# Patient Record
Sex: Male | Born: 1965 | Race: Black or African American | Hispanic: No | Marital: Single | Smoking: Never smoker
Health system: Southern US, Community
[De-identification: ages and names within clinical notes are randomized; demographics above are authoritative.]

## PROBLEM LIST (undated history)

## (undated) DIAGNOSIS — G4733 Obstructive sleep apnea (adult) (pediatric): Secondary | ICD-10-CM

## (undated) DIAGNOSIS — Z8619 Personal history of other infectious and parasitic diseases: Secondary | ICD-10-CM

## (undated) DIAGNOSIS — J9610 Chronic respiratory failure, unspecified whether with hypoxia or hypercapnia: Secondary | ICD-10-CM

## (undated) DIAGNOSIS — R569 Unspecified convulsions: Secondary | ICD-10-CM

## (undated) DIAGNOSIS — J9600 Acute respiratory failure, unspecified whether with hypoxia or hypercapnia: Secondary | ICD-10-CM

## (undated) DIAGNOSIS — Z87440 Personal history of urinary (tract) infections: Secondary | ICD-10-CM

## (undated) DIAGNOSIS — K56609 Unspecified intestinal obstruction, unspecified as to partial versus complete obstruction: Secondary | ICD-10-CM

## (undated) DIAGNOSIS — M4628 Osteomyelitis of vertebra, sacral and sacrococcygeal region: Secondary | ICD-10-CM

## (undated) DIAGNOSIS — B957 Other staphylococcus as the cause of diseases classified elsewhere: Secondary | ICD-10-CM

## (undated) DIAGNOSIS — F329 Major depressive disorder, single episode, unspecified: Secondary | ICD-10-CM

## (undated) DIAGNOSIS — J189 Pneumonia, unspecified organism: Secondary | ICD-10-CM

## (undated) DIAGNOSIS — Z9989 Dependence on other enabling machines and devices: Secondary | ICD-10-CM

## (undated) DIAGNOSIS — Z8711 Personal history of peptic ulcer disease: Secondary | ICD-10-CM

## (undated) DIAGNOSIS — Z8719 Personal history of other diseases of the digestive system: Secondary | ICD-10-CM

## (undated) DIAGNOSIS — K219 Gastro-esophageal reflux disease without esophagitis: Secondary | ICD-10-CM

## (undated) DIAGNOSIS — I10 Essential (primary) hypertension: Secondary | ICD-10-CM

## (undated) DIAGNOSIS — F32A Depression, unspecified: Secondary | ICD-10-CM

## (undated) DIAGNOSIS — L8994 Pressure ulcer of unspecified site, stage 4: Secondary | ICD-10-CM

## (undated) DIAGNOSIS — A419 Sepsis, unspecified organism: Secondary | ICD-10-CM

## (undated) DIAGNOSIS — G8250 Quadriplegia, unspecified: Secondary | ICD-10-CM

## (undated) DIAGNOSIS — D649 Anemia, unspecified: Secondary | ICD-10-CM

## (undated) DIAGNOSIS — L98499 Non-pressure chronic ulcer of skin of other sites with unspecified severity: Secondary | ICD-10-CM

## (undated) HISTORY — DX: Acute respiratory failure, unspecified whether with hypoxia or hypercapnia: J96.00

## (undated) HISTORY — DX: Other staphylococcus as the cause of diseases classified elsewhere: B95.7

## (undated) HISTORY — DX: Quadriplegia, unspecified: G82.50

## (undated) HISTORY — DX: Morbid (severe) obesity due to excess calories: E66.01

## (undated) HISTORY — DX: Pressure ulcer of unspecified site, stage 4: L89.94

## (undated) HISTORY — DX: Personal history of other infectious and parasitic diseases: Z86.19

## (undated) HISTORY — DX: Osteomyelitis of vertebra, sacral and sacrococcygeal region: M46.28

## (undated) HISTORY — DX: Personal history of other diseases of the digestive system: Z87.19

## (undated) HISTORY — DX: Anemia, unspecified: D64.9

## (undated) HISTORY — PX: SUPRAPUBIC CATHETER PLACEMENT: SHX2473

## (undated) HISTORY — DX: Personal history of urinary (tract) infections: Z87.440

## (undated) HISTORY — DX: Essential (primary) hypertension: I10

## (undated) HISTORY — DX: Personal history of peptic ulcer disease: Z87.11

## (undated) HISTORY — DX: Chronic respiratory failure, unspecified whether with hypoxia or hypercapnia: J96.10

---
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
## 1986-05-16 HISTORY — PX: POSTERIOR CERVICAL FUSION/FORAMINOTOMY: SHX5038

## 1998-11-26 ENCOUNTER — Emergency Department (HOSPITAL_COMMUNITY): Admission: EM | Admit: 1998-11-26 | Discharge: 1998-11-26 | Payer: Self-pay | Admitting: Emergency Medicine

## 1998-11-26 ENCOUNTER — Encounter: Payer: Self-pay | Admitting: Internal Medicine

## 1998-12-07 ENCOUNTER — Encounter: Payer: Self-pay | Admitting: Emergency Medicine

## 1998-12-08 ENCOUNTER — Inpatient Hospital Stay (HOSPITAL_COMMUNITY): Admission: EM | Admit: 1998-12-08 | Discharge: 1998-12-11 | Payer: Self-pay | Admitting: Emergency Medicine

## 1998-12-09 ENCOUNTER — Encounter: Payer: Self-pay | Admitting: Internal Medicine

## 2003-06-25 ENCOUNTER — Inpatient Hospital Stay (HOSPITAL_COMMUNITY): Admission: EM | Admit: 2003-06-25 | Discharge: 2003-06-30 | Payer: Self-pay | Admitting: Emergency Medicine

## 2003-11-16 ENCOUNTER — Emergency Department (HOSPITAL_COMMUNITY): Admission: EM | Admit: 2003-11-16 | Discharge: 2003-11-16 | Payer: Self-pay | Admitting: Emergency Medicine

## 2004-03-17 ENCOUNTER — Emergency Department (HOSPITAL_COMMUNITY): Admission: EM | Admit: 2004-03-17 | Discharge: 2004-03-17 | Payer: Self-pay | Admitting: Emergency Medicine

## 2004-08-25 ENCOUNTER — Emergency Department (HOSPITAL_COMMUNITY): Admission: EM | Admit: 2004-08-25 | Discharge: 2004-08-25 | Payer: Self-pay | Admitting: Emergency Medicine

## 2004-08-27 ENCOUNTER — Inpatient Hospital Stay (HOSPITAL_COMMUNITY): Admission: EM | Admit: 2004-08-27 | Discharge: 2004-08-31 | Payer: Self-pay | Admitting: Emergency Medicine

## 2005-03-02 ENCOUNTER — Emergency Department (HOSPITAL_COMMUNITY): Admission: EM | Admit: 2005-03-02 | Discharge: 2005-03-02 | Payer: Self-pay | Admitting: Emergency Medicine

## 2005-05-10 ENCOUNTER — Inpatient Hospital Stay (HOSPITAL_COMMUNITY): Admission: EM | Admit: 2005-05-10 | Discharge: 2005-06-17 | Payer: Self-pay | Admitting: Emergency Medicine

## 2005-05-16 HISTORY — PX: COLOSTOMY: SHX63

## 2005-05-26 ENCOUNTER — Ambulatory Visit: Payer: Self-pay | Admitting: Internal Medicine

## 2005-05-29 ENCOUNTER — Encounter (INDEPENDENT_AMBULATORY_CARE_PROVIDER_SITE_OTHER): Payer: Self-pay | Admitting: Specialist

## 2005-05-31 ENCOUNTER — Encounter (INDEPENDENT_AMBULATORY_CARE_PROVIDER_SITE_OTHER): Payer: Self-pay | Admitting: Cardiology

## 2005-06-02 ENCOUNTER — Ambulatory Visit: Payer: Self-pay | Admitting: Plastic Surgery

## 2005-09-08 ENCOUNTER — Emergency Department (HOSPITAL_COMMUNITY): Admission: EM | Admit: 2005-09-08 | Discharge: 2005-09-09 | Payer: Self-pay | Admitting: Emergency Medicine

## 2006-01-23 ENCOUNTER — Emergency Department (HOSPITAL_COMMUNITY): Admission: EM | Admit: 2006-01-23 | Discharge: 2006-01-23 | Payer: Self-pay | Admitting: Emergency Medicine

## 2006-02-06 IMAGING — CR DG ABDOMEN ACUTE W/ 1V CHEST
4 series · 4 of 4 positions shown · non-contrast
Comparison: none

CLINICAL DATA: Nausea/vomiting.
 ACUTE ABDOMEN WITH CHEST 
 AP chest x-ray compared to 12/07/98. 
 Heart enlarged.  Cannot rule out early right upper lobe air space disease although this could easily be a chronic abnormality.  Flat and decubitus views of the abdomen show generalized dilatation of large and small bowel loops without free air.  Short segment air fluid levels are noted but no definitive support specific abnormality.  There appears to be myositis ossificans of the hips. 
 IMPRESSION 
 Cardiomegaly. 
 Question early right upper lobe air space disease.  
 Diffuse dilatation of large and small bowel loops without evidence for obstruction.

[view not recorded (1 of 4)]
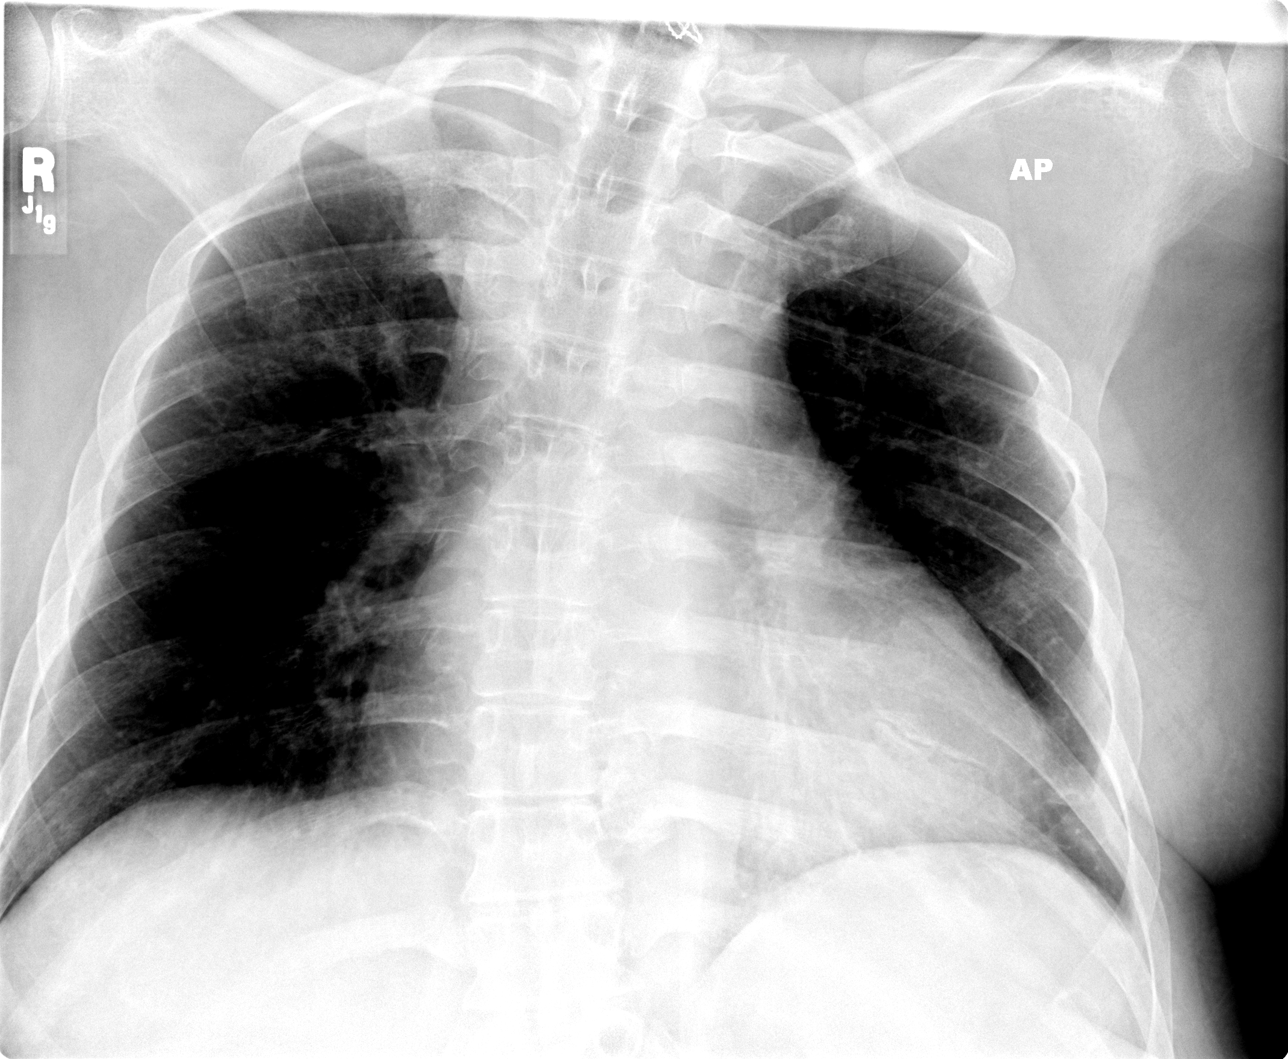

[view not recorded (2 of 4)]
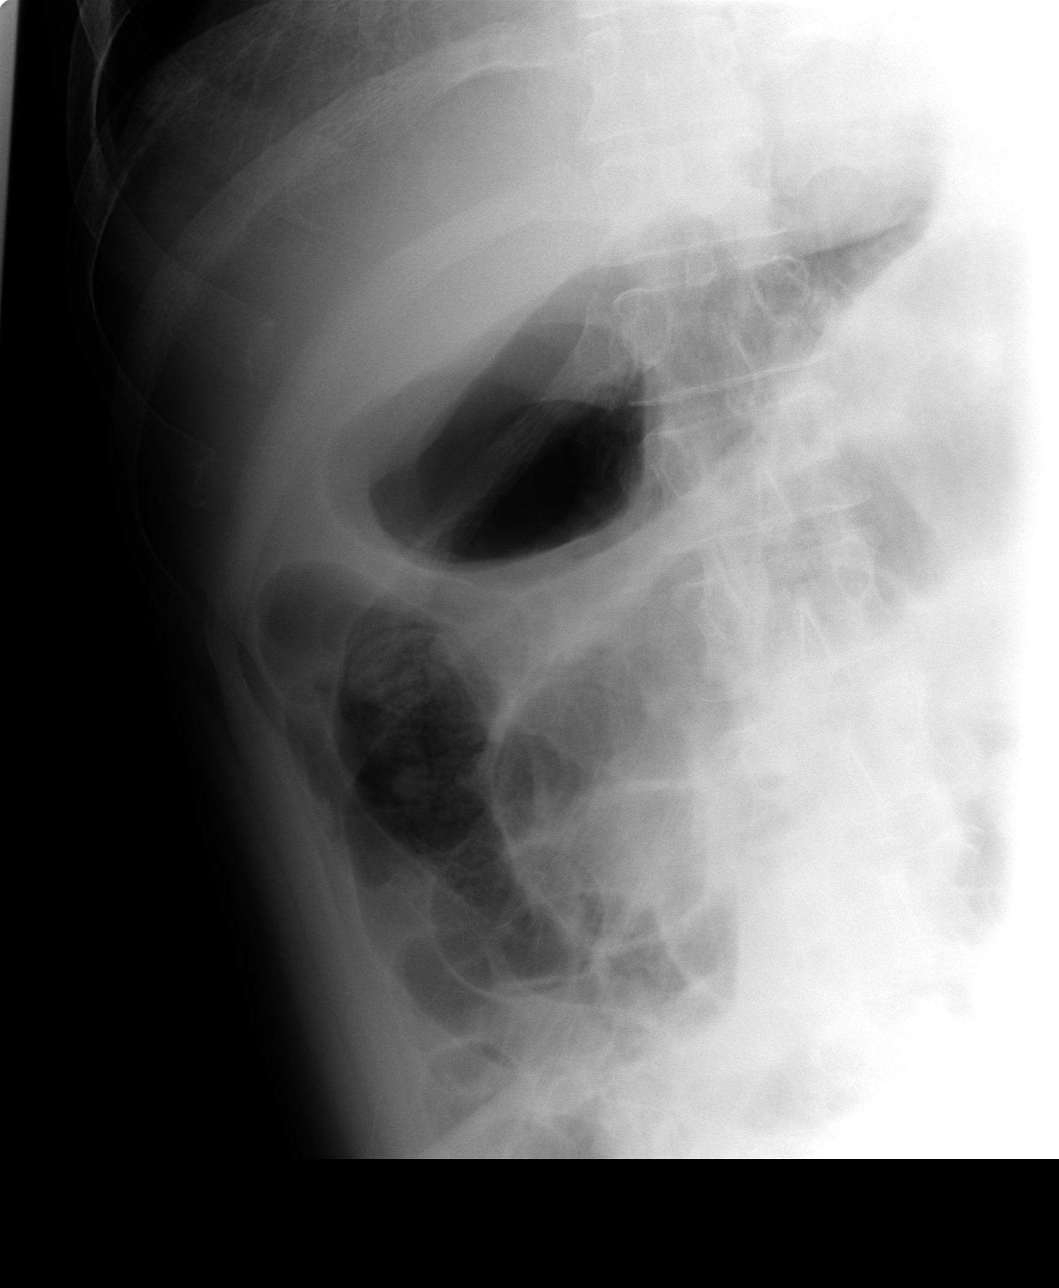

[view not recorded (3 of 4)]
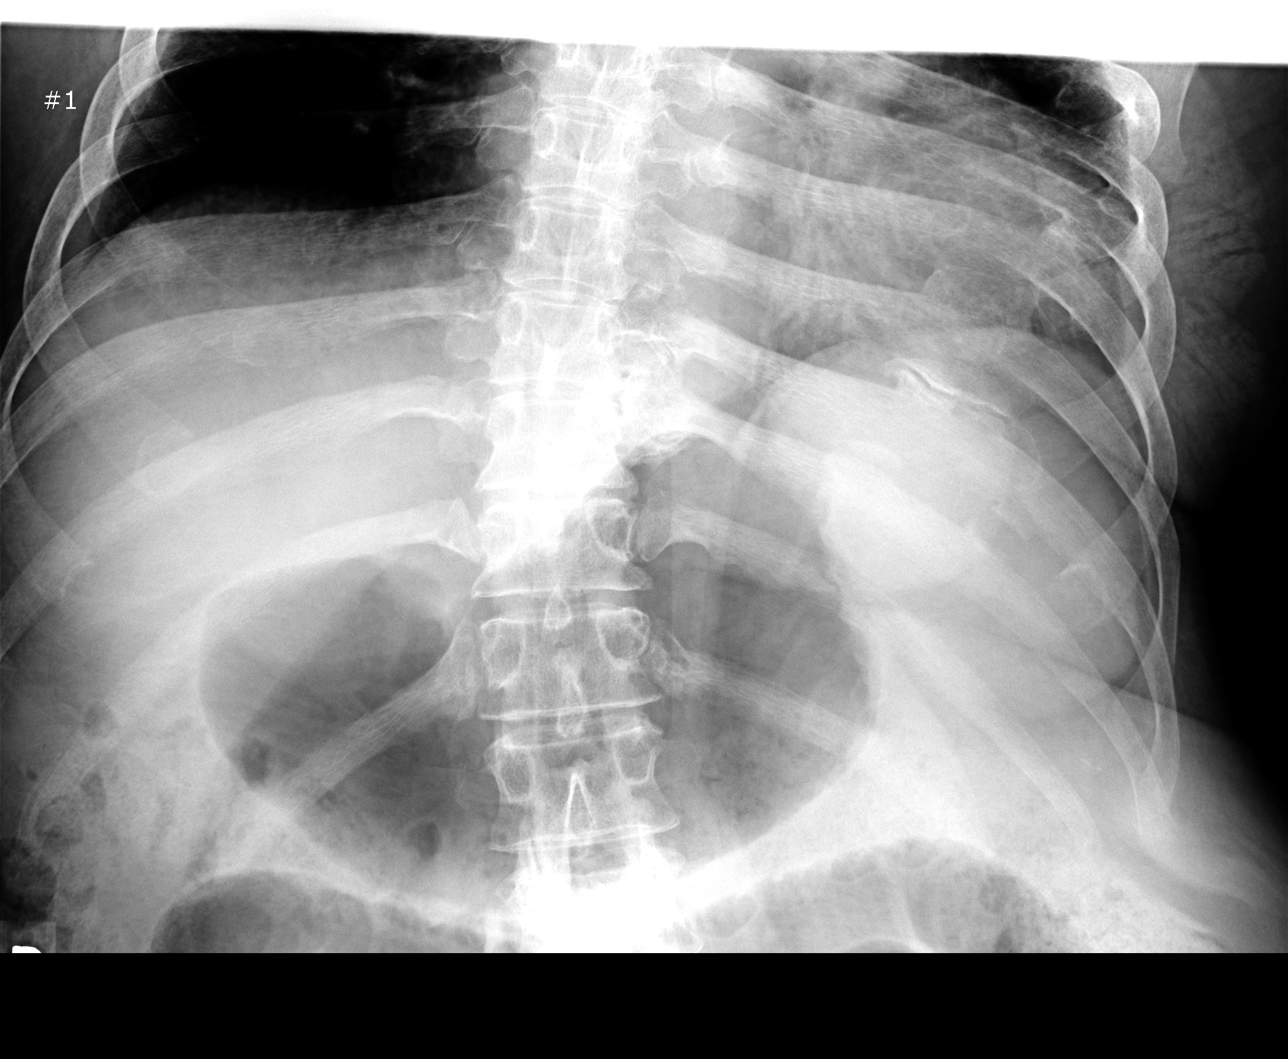

[view not recorded (4 of 4)]
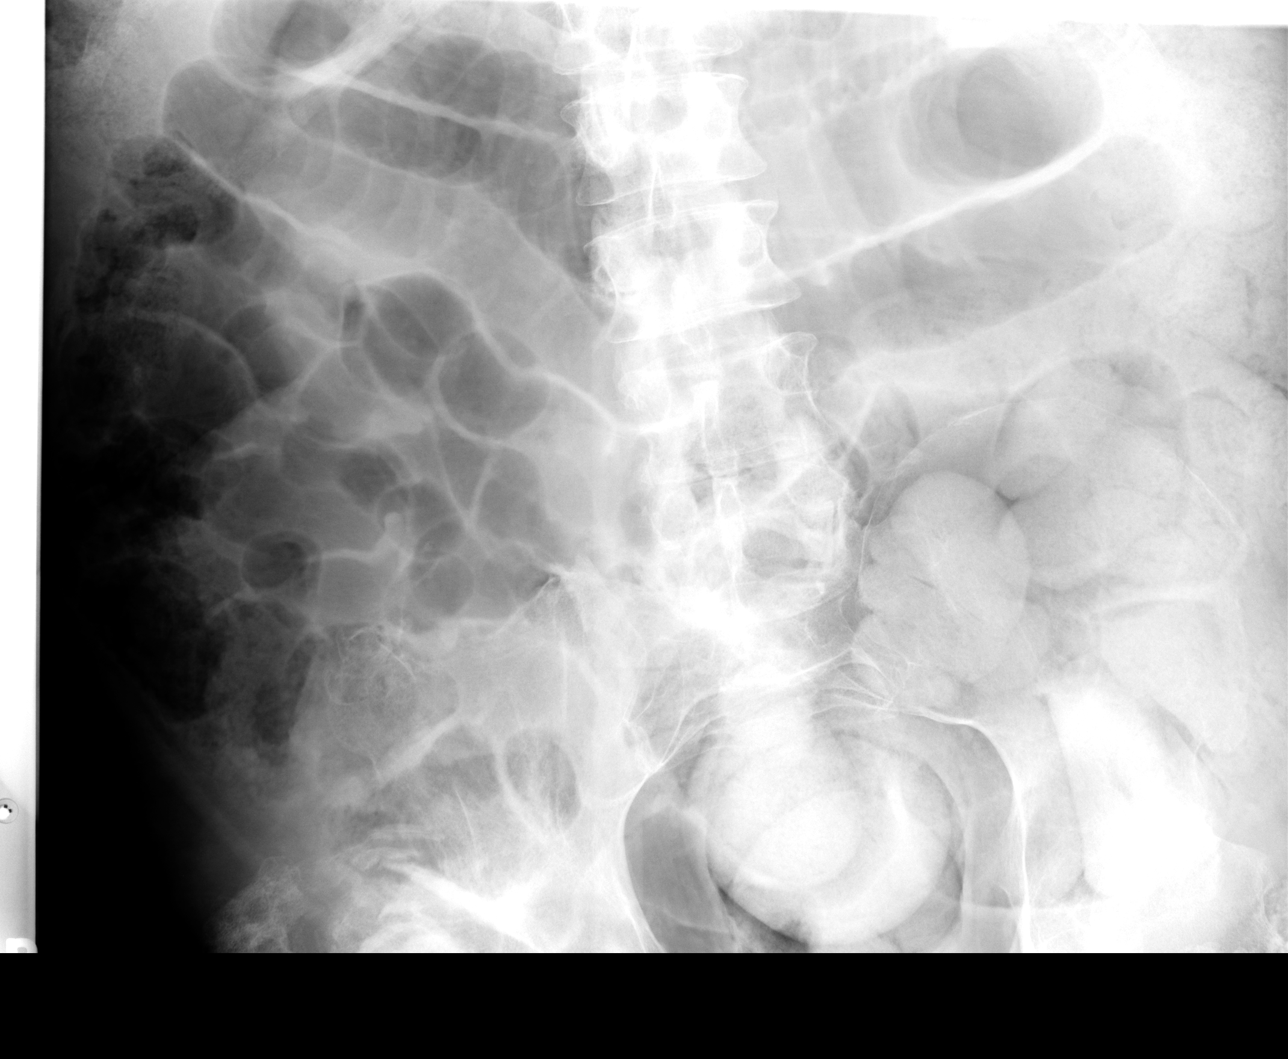

[4 of 4 positions shown; findings below may reference images not displayed]

## 2006-02-07 IMAGING — CR DG ABD PORTABLE 1V
1 series · 1 of 1 positions shown · non-contrast
Comparison: none

CLINICAL DATA: Nausea/vomiting.
 PORTABLE ABDOMEN, 06/26/03, [DATE] HOURS
 Comparison 06/25/03.
 There is gaseous distention of the colon.  Multiple gas-distended small bowel loops borderline dilated.  Phlebolith in lower left pelvis.
 IMPRESSION 
 Increase in gaseous distention of small bowel and colon.  Question worsening ileus.

[view not recorded]
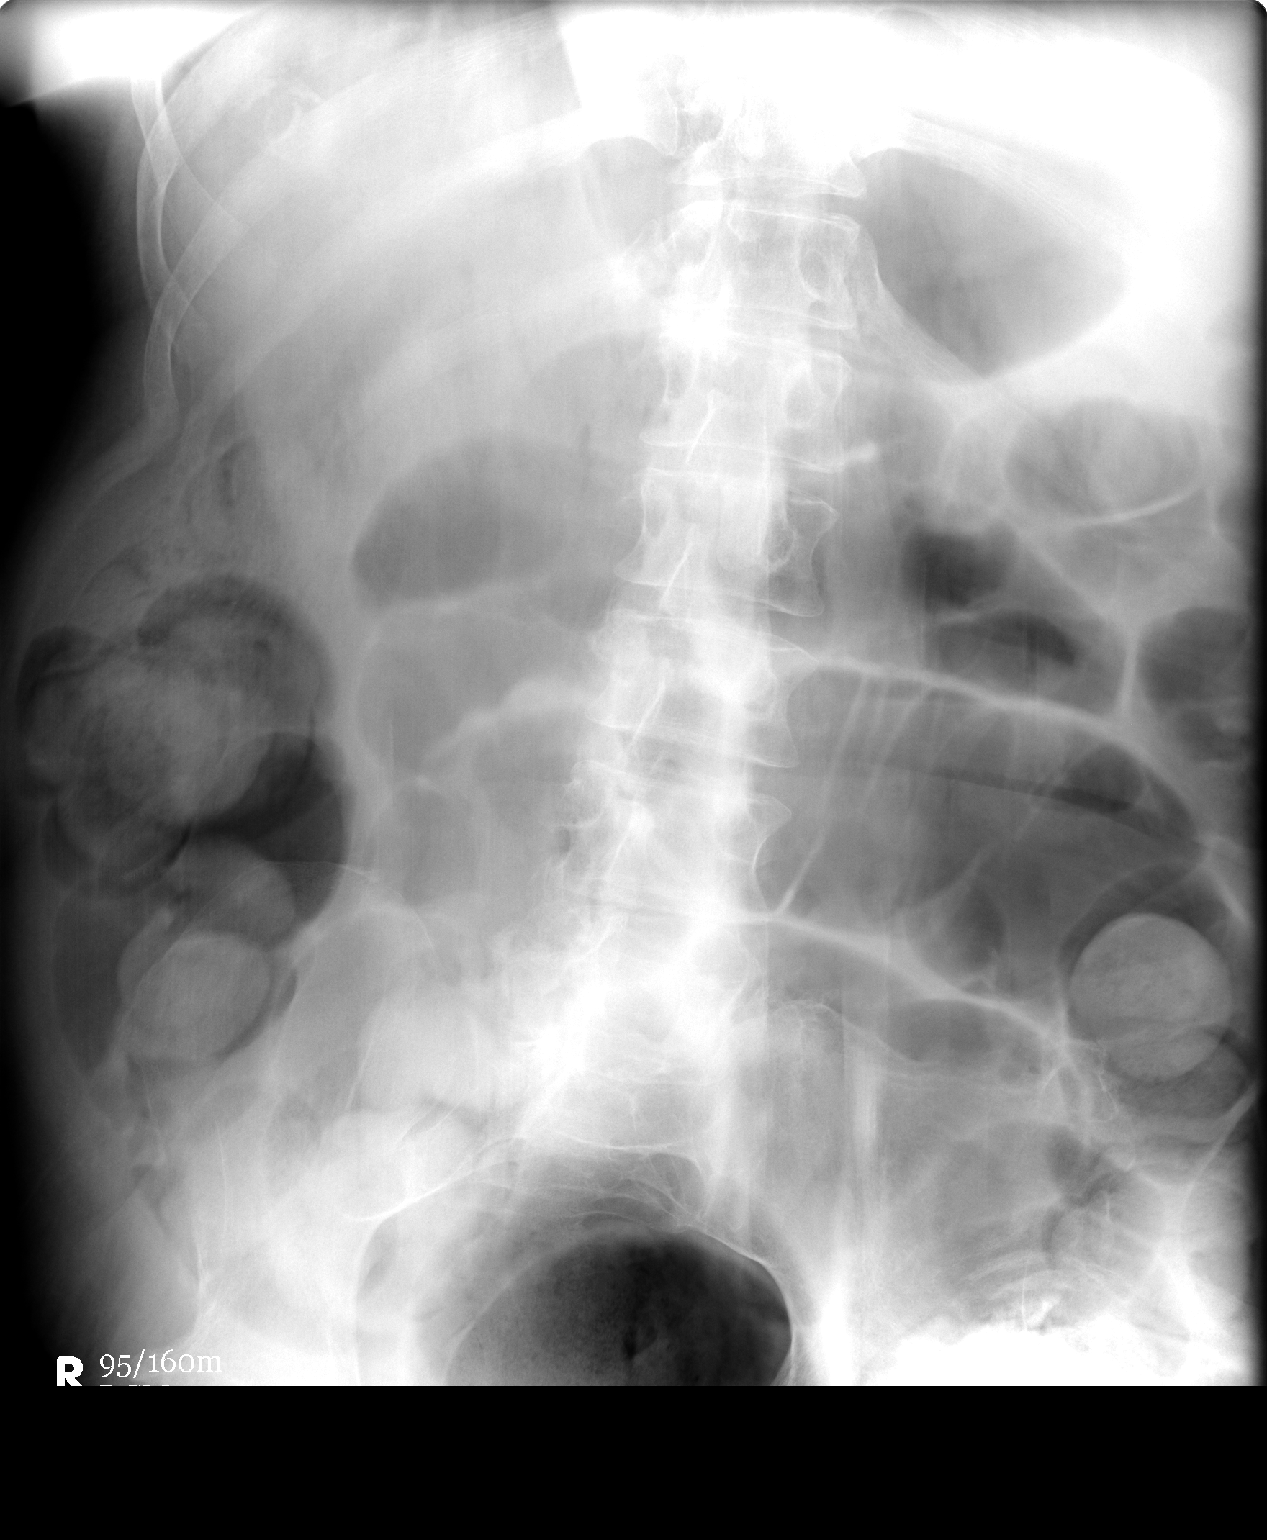

[1 of 1 positions shown; findings below may reference images not displayed]

## 2006-02-07 IMAGING — XA IR CV CATH FLUORO GUIDE
1 series · 1 of 1 positions shown · non-contrast
Comparison: none

CLINICAL DATA: Pneumonia, needs PICC line placement. 
 FLUOROSCOPIC AND ULTRASOUND GUIDED PICC LINE PLACEMENT
TECHNIQUE: The right arm was prepped with Betadine, draped in the usual sterile fashion, and infiltrated locally with 1% lidocaine.  Ultrasound demonstrated patency of the right basilic vein.  Under real-time ultrasound guidance, this vein was accessed with a 09gauge micropuncture needle.  Ultrasound image documentation was performed.  The needle was exchanged over a guidewire for a peel-away sheath, through which a 5 french double lumen PICC catheter trimmed to 44 cm was advanced, positioned with its tip at the distal SVC/right atrial junction.  Fluoroscopy during the procedure and fluoro spot radiograph confirms appropriate catheter position.  The catheter was flushed, secured to the skin with Prolene sutures, and covered with a sterile dressing.  No immediate complication.  
 IMPRESSION
 Technically successful right arm PICC placement with ultrasound and fluoroscopic guidance.  Ready for routine use.

[Series 1: run · 1 of 1 slices shown]
[im 1/1]
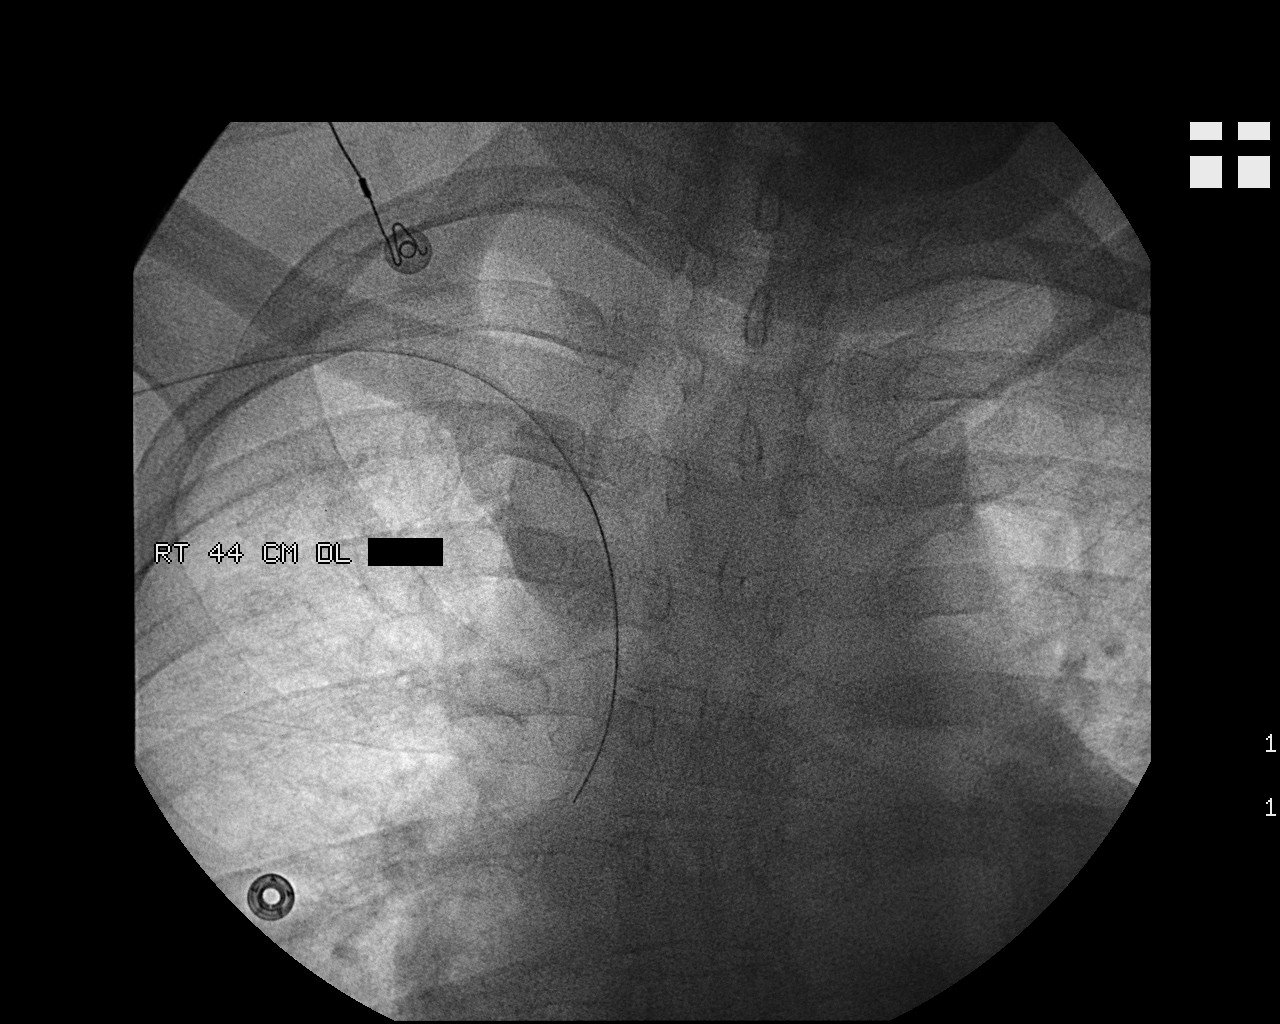

[1 of 1 positions shown; findings below may reference images not displayed]

## 2006-02-08 IMAGING — CR DG CHEST 1V PORT
1 series · 1 of 1 positions shown · non-contrast
Comparison: none

CLINICAL DATA: Pneumonia.  
 PORTABLE CHEST 06/27/03 AT 8027 HOURS

[view not recorded]
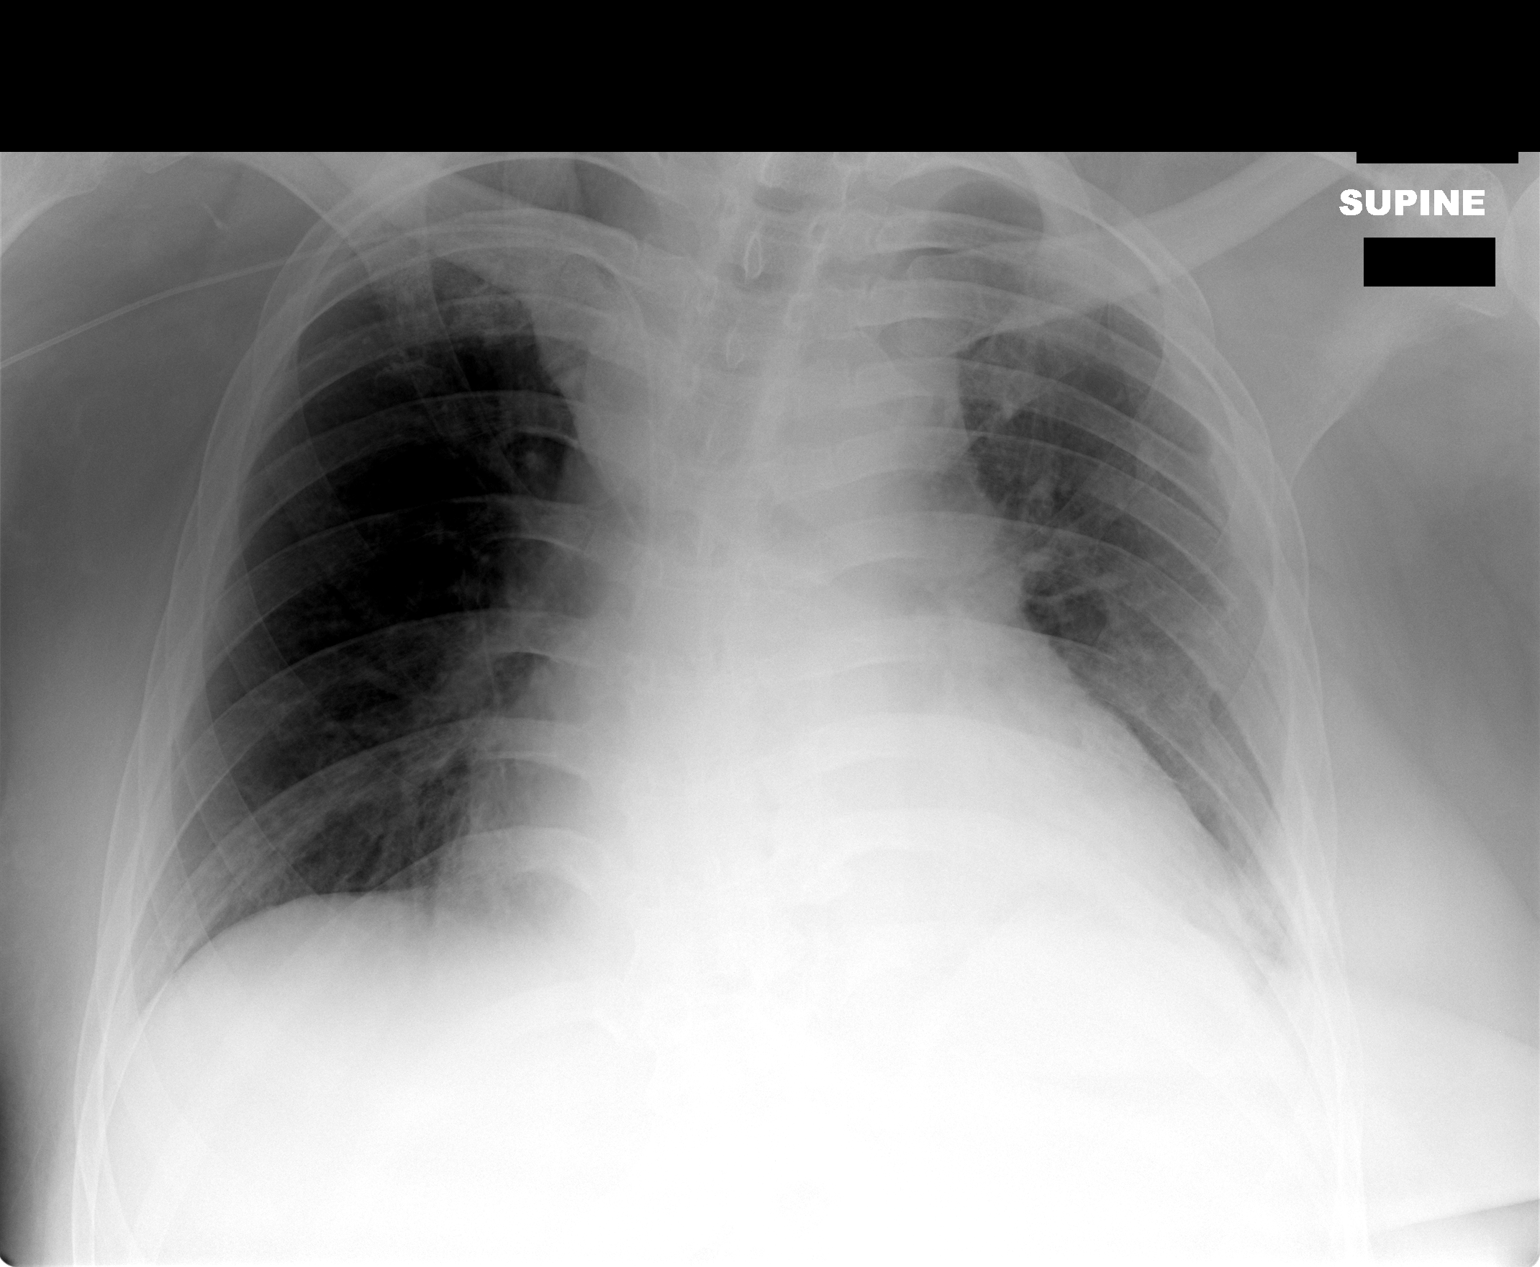

[1 of 1 positions shown; findings below may reference images not displayed]

FINDINGS: PICC line extends to the low SVC.  Coarse opacities in the right upper lobe are stable compared to films dating back to 12/03/96.  There is moderate enlargement of the cardiac silhouette.  Old left rib fractures are evident.  
 IMPRESSION
 Stable cardiomegaly and chronic changes as above.

## 2006-02-09 IMAGING — CR DG ABD PORTABLE 1V
1 series · 1 of 1 positions shown · non-contrast
Comparison: none

CLINICAL DATA: Ileus.  
 PORTABLE ABDOMEN 06/28/03
 Comparison to 06/26/03.  The lower abdomen is excluded.  There is a moderate amount of stool in the proximal colon with gaseous distention of colon and distention of some visualized small bowel loops.  The degree of distention appears stable to that seen on previous study.  
 IMPRESSION
 Little apparent change in ileus since 06/26/03.  Note that the lower abdomen is excluded.

[view not recorded]
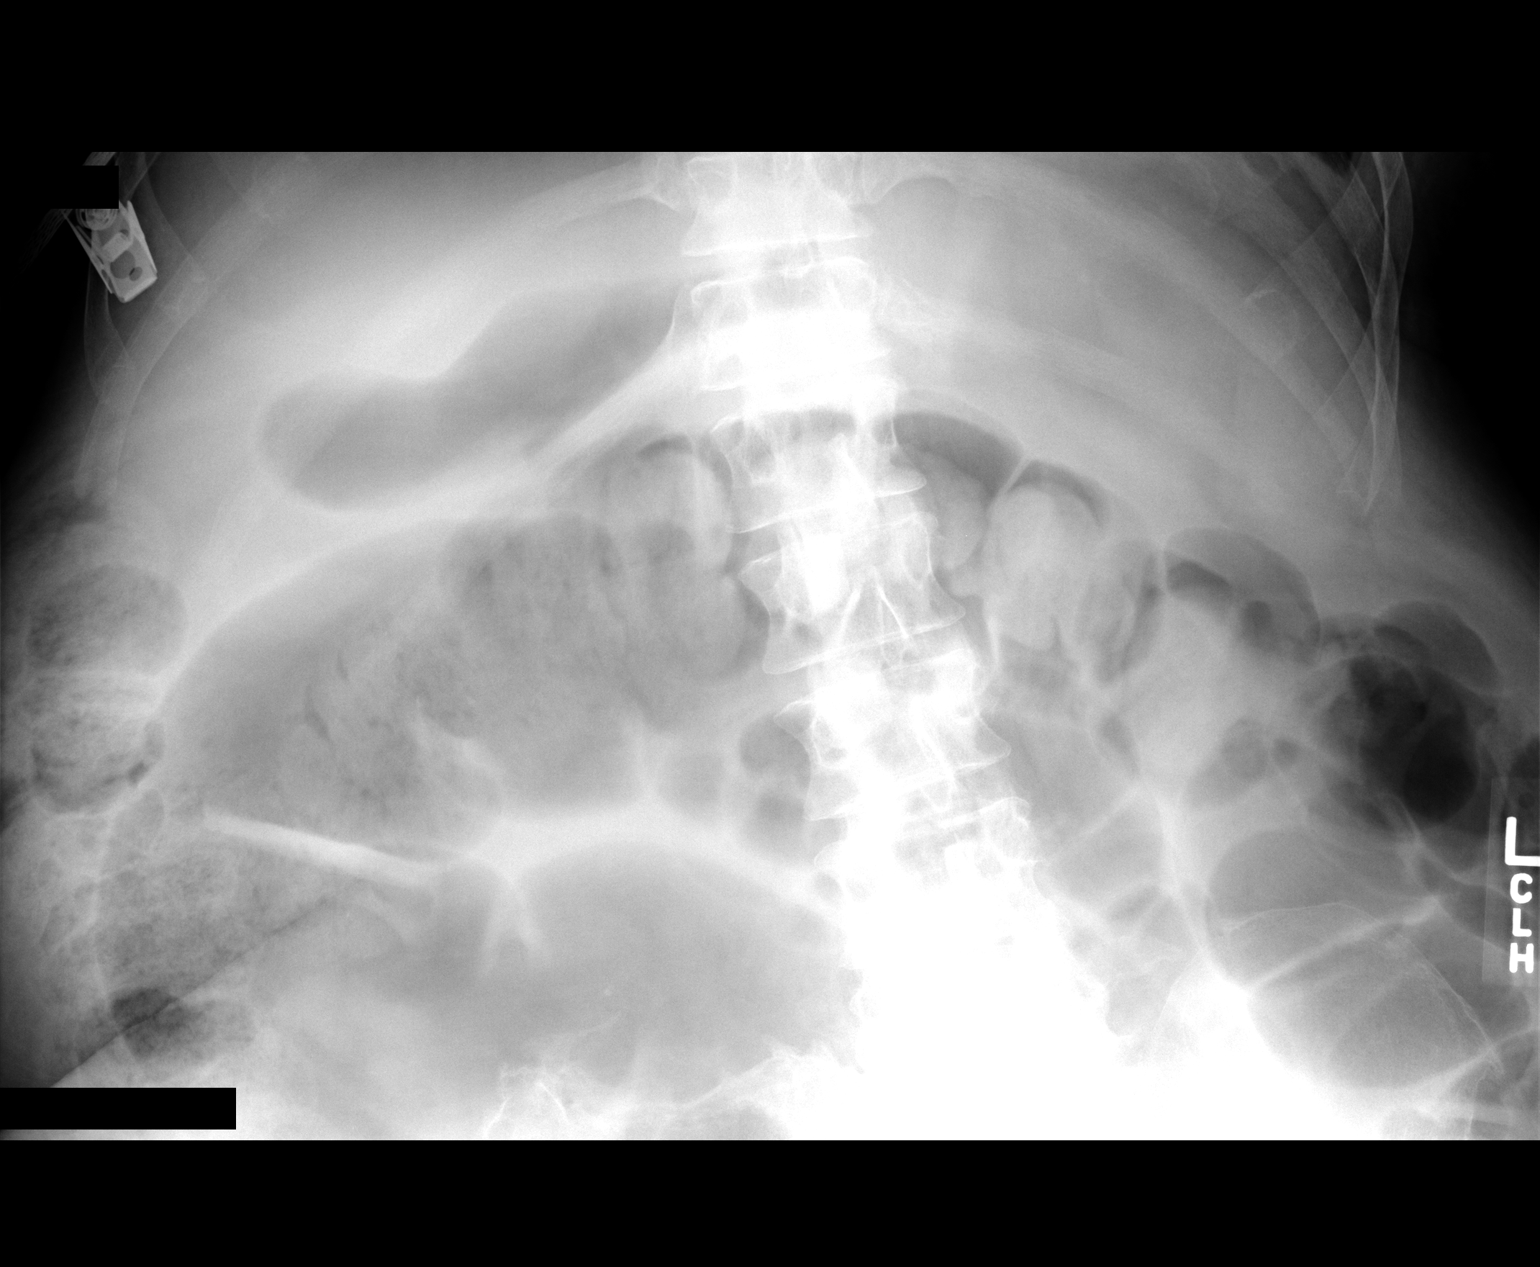

[1 of 1 positions shown; findings below may reference images not displayed]

## 2006-03-25 ENCOUNTER — Emergency Department (HOSPITAL_COMMUNITY): Admission: EM | Admit: 2006-03-25 | Discharge: 2006-03-25 | Payer: Self-pay | Admitting: Emergency Medicine

## 2006-06-19 ENCOUNTER — Inpatient Hospital Stay (HOSPITAL_COMMUNITY): Admission: EM | Admit: 2006-06-19 | Discharge: 2006-06-22 | Payer: Self-pay | Admitting: Emergency Medicine

## 2006-06-19 ENCOUNTER — Ambulatory Visit: Payer: Self-pay | Admitting: Vascular Surgery

## 2006-06-22 ENCOUNTER — Ambulatory Visit: Payer: Self-pay | Admitting: Gastroenterology

## 2006-06-30 IMAGING — CR DG CHEST 2V
3 series · 3 of 3 positions shown · non-contrast
Comparison: none

CLINICAL DATA: Chills and vomiting.  

 TWO VIEW CHEST, 11/16/03 
 Comparison 06/27/03.

[view not recorded (1 of 3)]
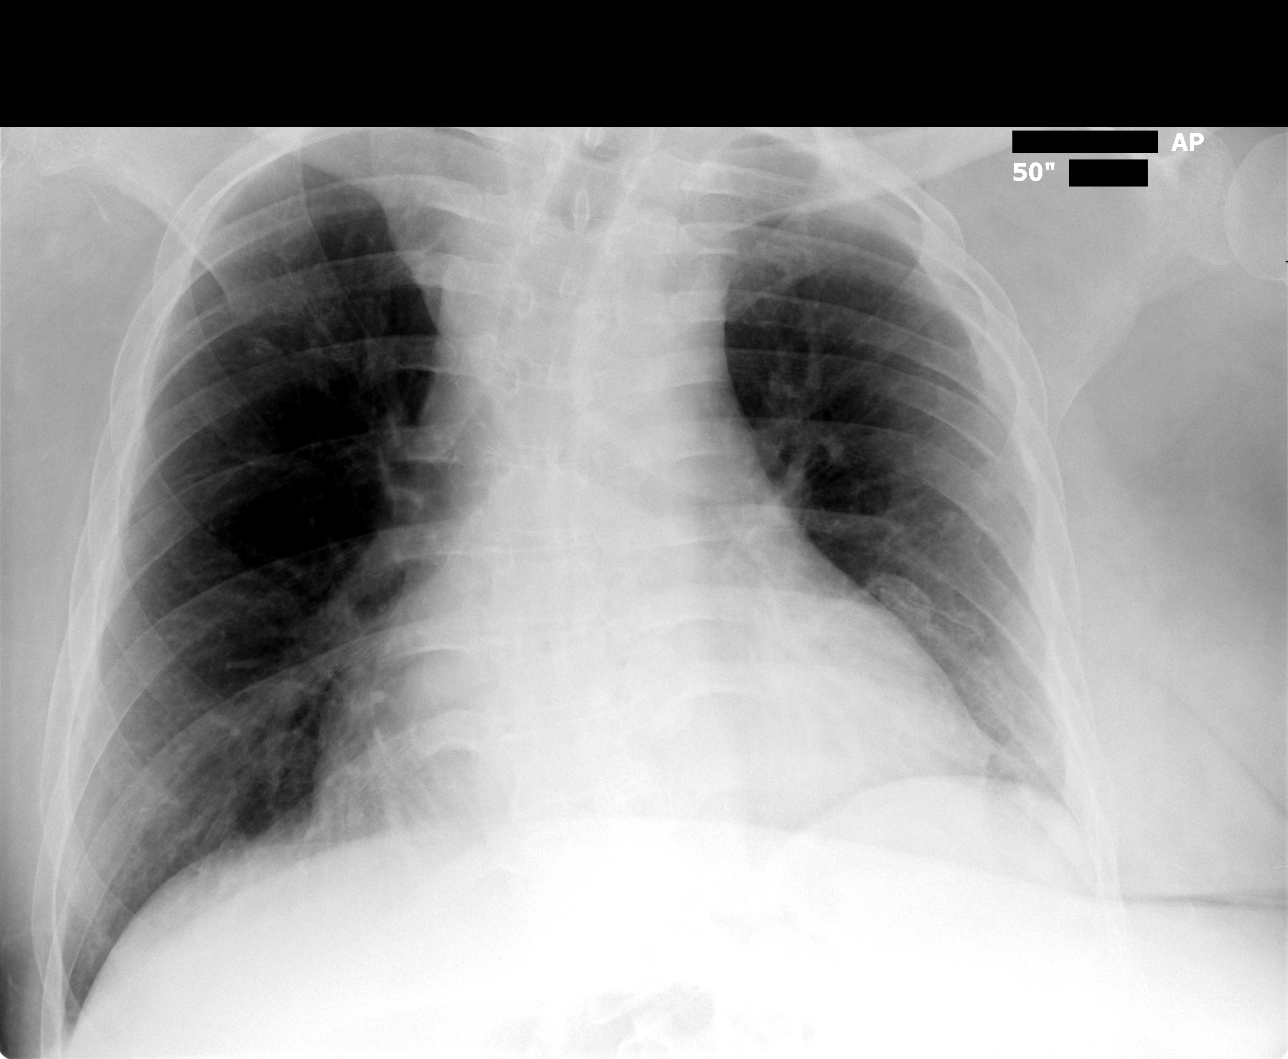

[view not recorded (2 of 3)]
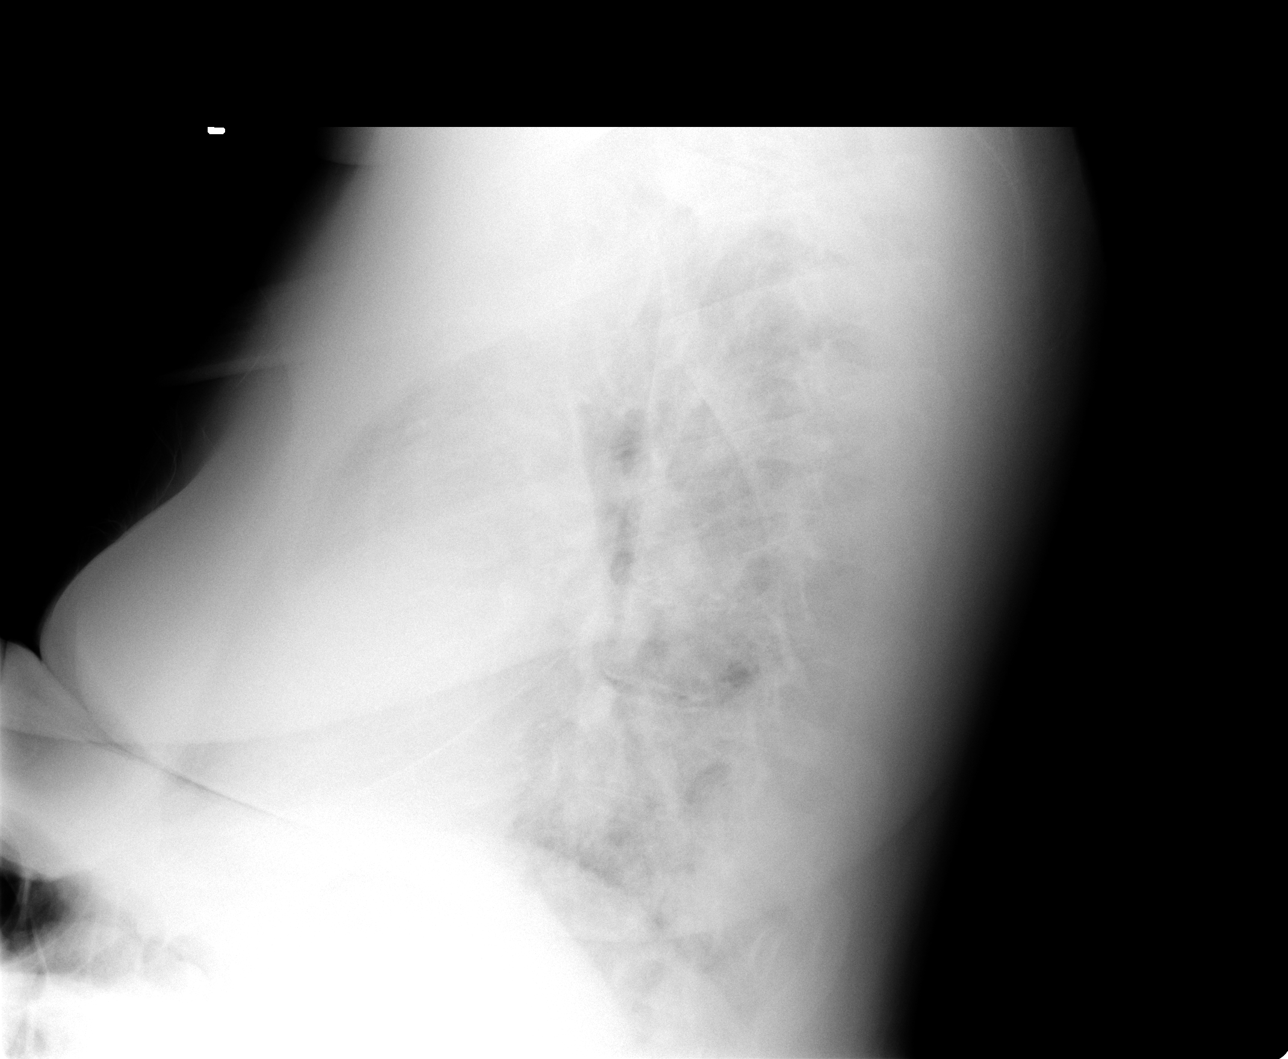

[view not recorded (3 of 3)]
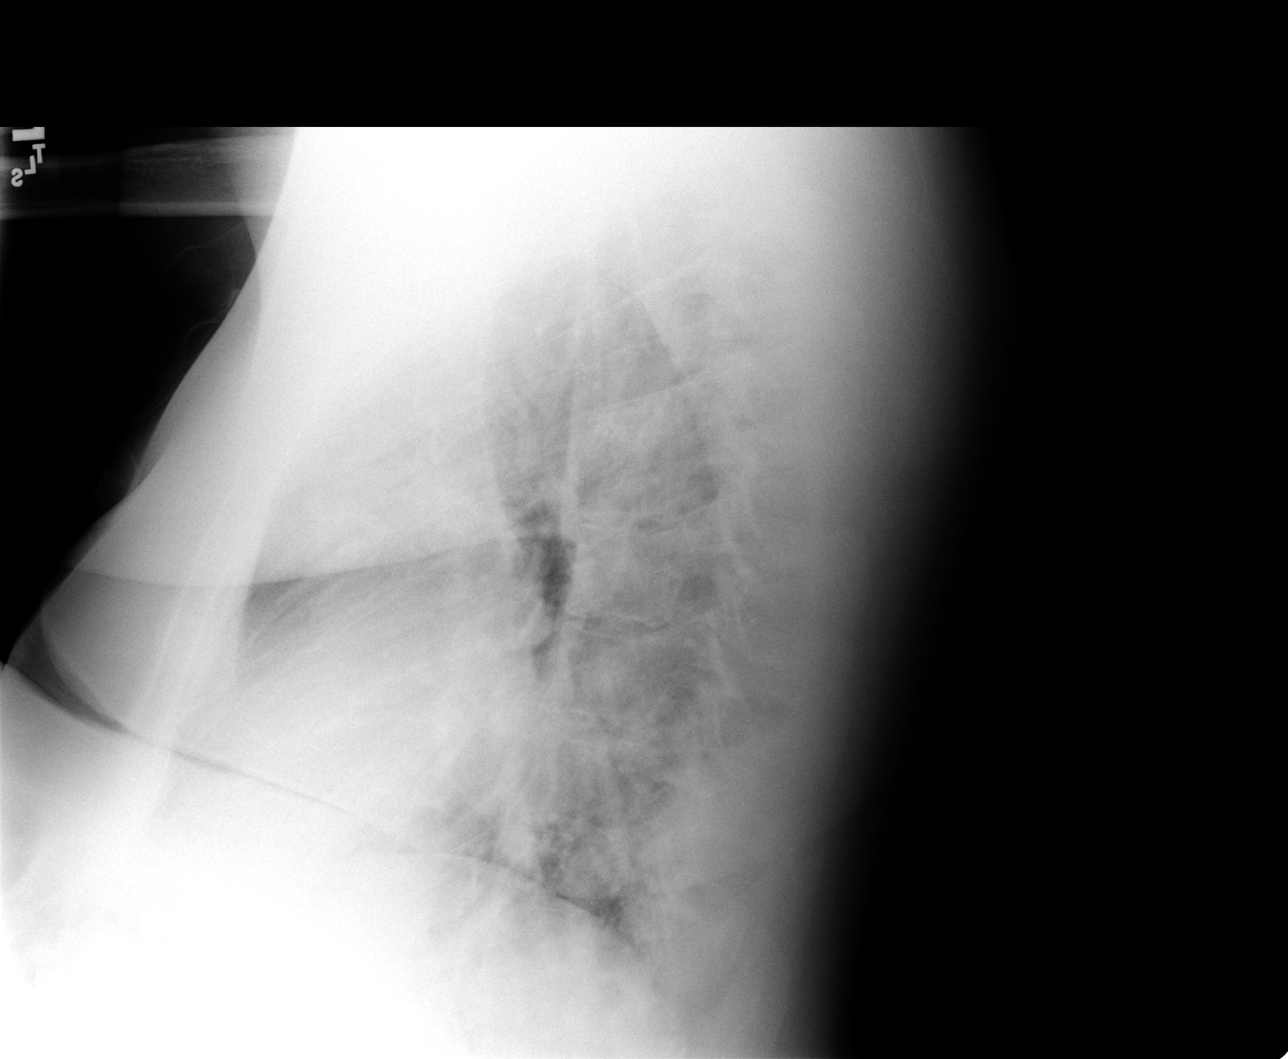

[3 of 3 positions shown; findings below may reference images not displayed]

FINDINGS: Two view exam of the chest shows no substantial change when compared to 06/27/03.  The coarse opacities over the right upper lobe on the previous study are unchanged.  Cardiopericardial silhouette remains enlarged.  Left pleural thickening and apparent healed left rib fractures are unchanged in appearance. 

 IMPRESSION
 Cardiomegaly.  

 Stable exam.  No acute cardiopulmonary process.

 [REDACTED]

## 2006-07-17 ENCOUNTER — Inpatient Hospital Stay (HOSPITAL_COMMUNITY): Admission: EM | Admit: 2006-07-17 | Discharge: 2006-07-21 | Payer: Self-pay | Admitting: Emergency Medicine

## 2006-07-25 ENCOUNTER — Ambulatory Visit: Payer: Self-pay | Admitting: Gastroenterology

## 2006-07-25 ENCOUNTER — Inpatient Hospital Stay (HOSPITAL_COMMUNITY): Admission: EM | Admit: 2006-07-25 | Discharge: 2006-07-29 | Payer: Self-pay | Admitting: Emergency Medicine

## 2006-07-27 ENCOUNTER — Ambulatory Visit: Payer: Self-pay | Admitting: *Deleted

## 2006-08-25 ENCOUNTER — Emergency Department (HOSPITAL_COMMUNITY): Admission: EM | Admit: 2006-08-25 | Discharge: 2006-08-25 | Payer: Self-pay | Admitting: Emergency Medicine

## 2006-09-26 ENCOUNTER — Inpatient Hospital Stay (HOSPITAL_COMMUNITY): Admission: EM | Admit: 2006-09-26 | Discharge: 2006-10-02 | Payer: Self-pay | Admitting: Emergency Medicine

## 2006-10-11 ENCOUNTER — Ambulatory Visit (HOSPITAL_COMMUNITY): Admission: RE | Admit: 2006-10-11 | Discharge: 2006-10-11 | Payer: Self-pay | Admitting: Internal Medicine

## 2006-10-30 IMAGING — CR DG CHEST 1V PORT
1 series · 1 of 1 positions shown · non-contrast
Comparison: Portable chest x-ray 11/16/2003.

CLINICAL DATA: Fever, generalized weakness.

PORTABLE CHEST - 1 VIEW  [DATE]/[id number] hours:

[view not recorded]
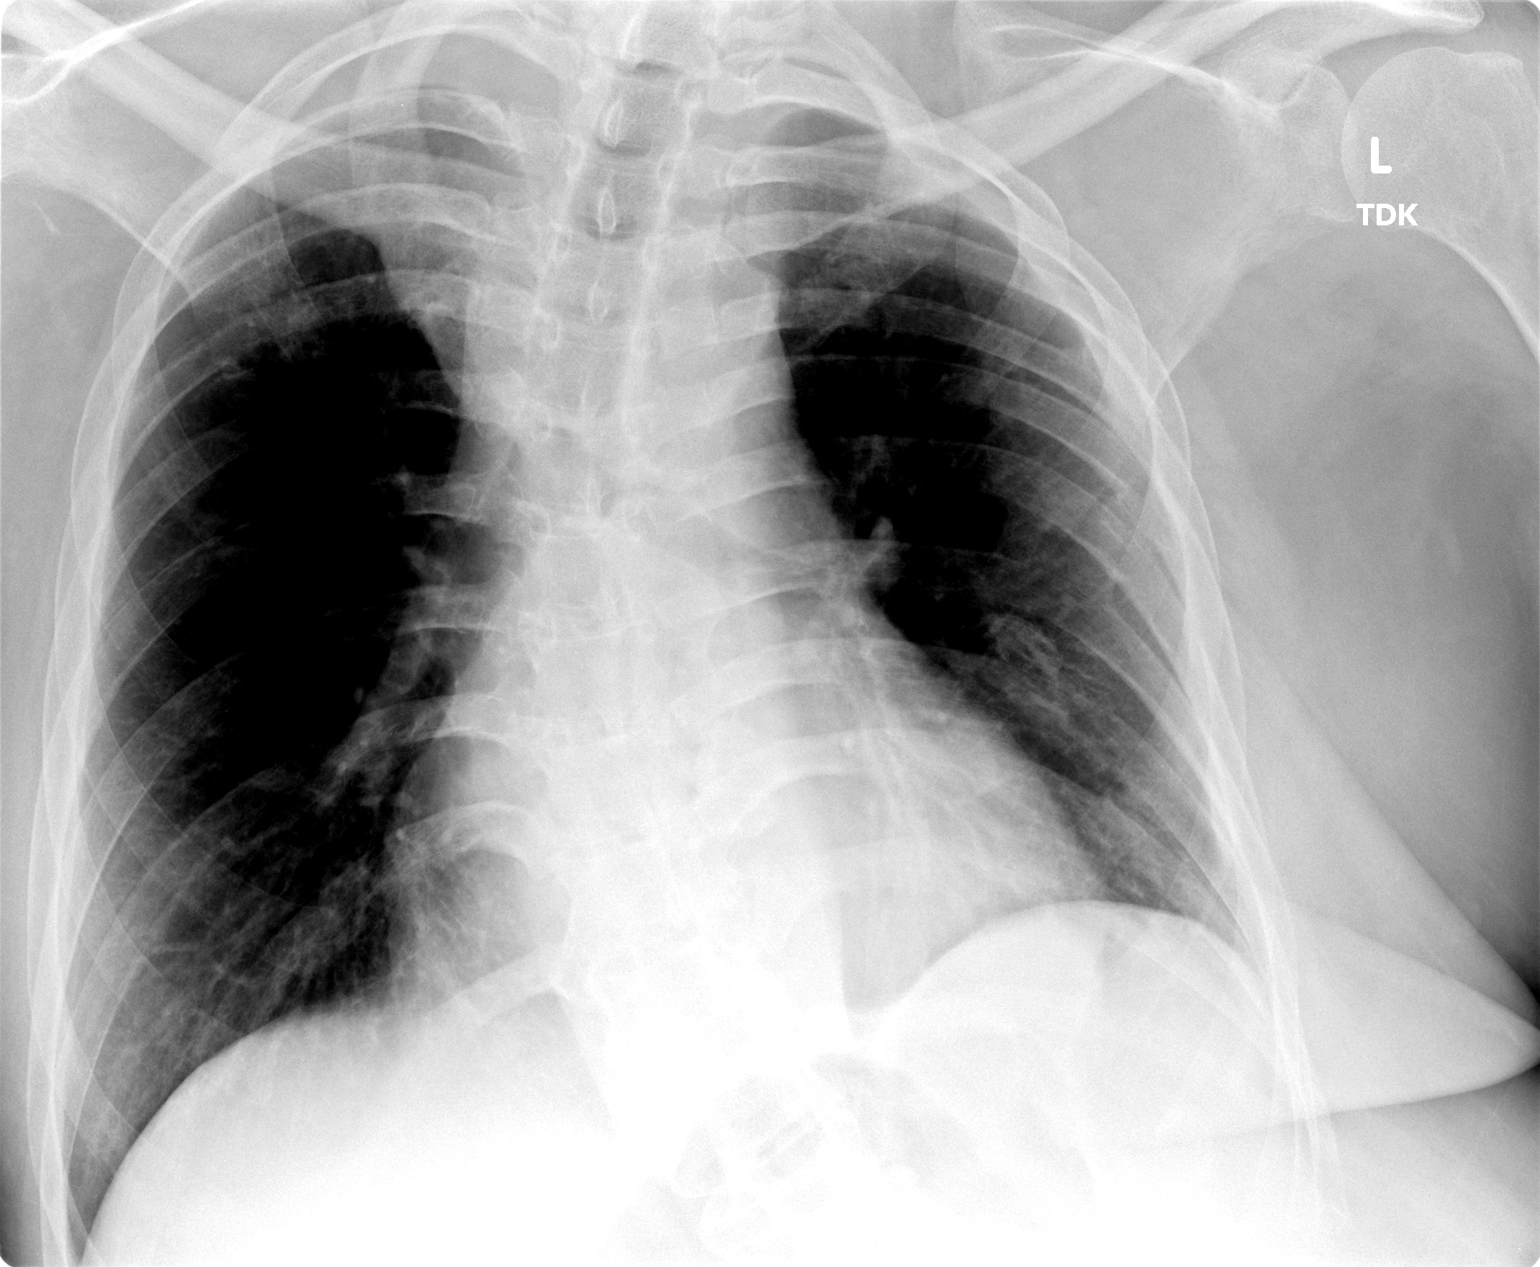

[1 of 1 positions shown; findings below may reference images not displayed]

FINDINGS: The heart is mildly enlarged though stable. The lungs appear clear. Note is again made
of a prominent epicardial fat pad on the right. Thoracic scoliosis convex right again noted.
IMPRESSION: Mild cardiomegaly. No evidence of acute disease.

## 2007-02-05 ENCOUNTER — Emergency Department (HOSPITAL_COMMUNITY): Admission: EM | Admit: 2007-02-05 | Discharge: 2007-02-05 | Payer: Self-pay | Admitting: Emergency Medicine

## 2007-04-09 IMAGING — CR DG CHEST 1V PORT
1 series · 1 of 1 positions shown · non-contrast
Comparison: 03/17/04.

CLINICAL DATA: Diaphoresis. Sweating and chills.   Quadriplegia. 
 PORTABLE CHEST ? 08/25/04:

[view not recorded]
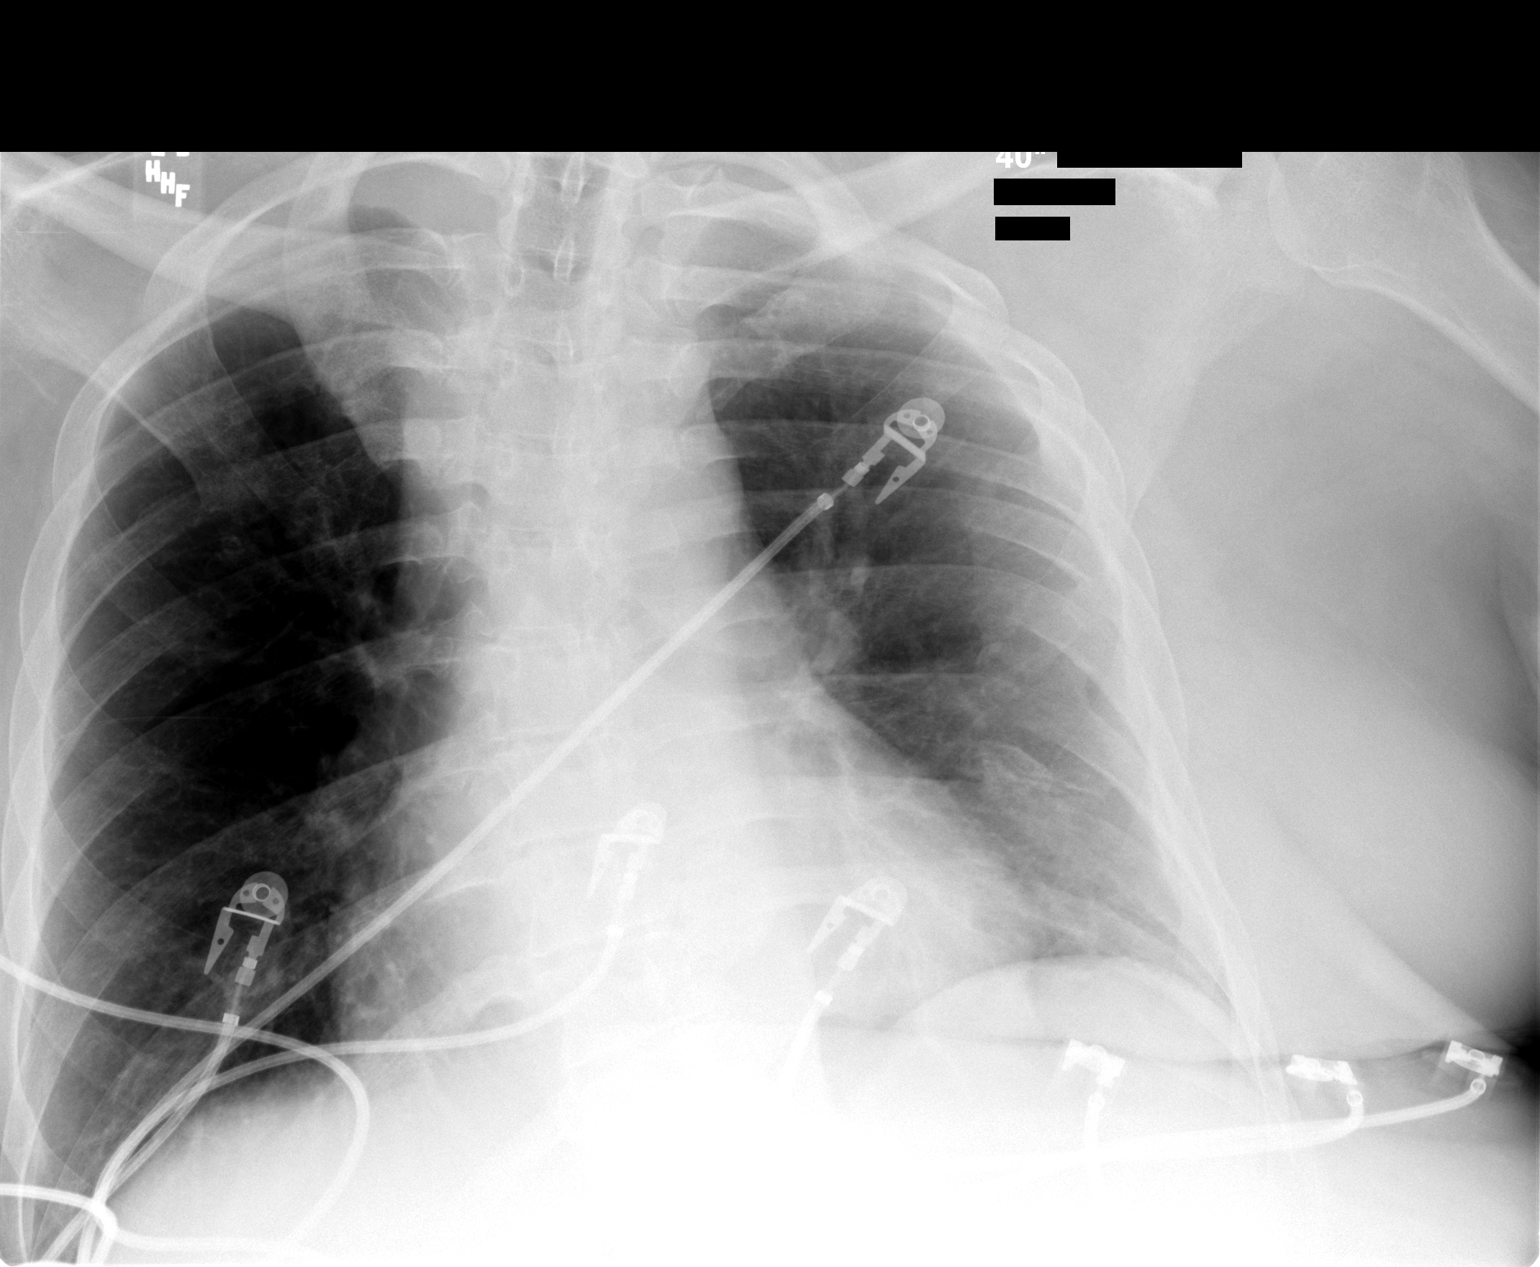

[1 of 1 positions shown; findings below may reference images not displayed]

FINDINGS: Dextroconvex thoracic scoliosis is present.  
 Old left rib fractures are present with bridging callous and residual pleural thickening.  There is also some biapical pleural thickening which is stable.  No air space opacity is identified.  Heart size is borderline enlarged.
IMPRESSION: Stable radiographic appearance of the chest without conventional radiographic evidence of pneumonia.

## 2007-05-27 ENCOUNTER — Emergency Department (HOSPITAL_COMMUNITY): Admission: EM | Admit: 2007-05-27 | Discharge: 2007-05-27 | Payer: Self-pay | Admitting: Emergency Medicine

## 2007-06-17 ENCOUNTER — Emergency Department (HOSPITAL_COMMUNITY): Admission: EM | Admit: 2007-06-17 | Discharge: 2007-06-18 | Payer: Self-pay | Admitting: Emergency Medicine

## 2007-06-30 ENCOUNTER — Emergency Department (HOSPITAL_COMMUNITY): Admission: EM | Admit: 2007-06-30 | Discharge: 2007-07-01 | Payer: Self-pay | Admitting: Emergency Medicine

## 2007-07-17 ENCOUNTER — Encounter (HOSPITAL_BASED_OUTPATIENT_CLINIC_OR_DEPARTMENT_OTHER): Admission: RE | Admit: 2007-07-17 | Discharge: 2007-07-26 | Payer: Self-pay | Admitting: Surgery

## 2007-10-15 DIAGNOSIS — Z8719 Personal history of other diseases of the digestive system: Secondary | ICD-10-CM

## 2007-10-15 HISTORY — DX: Personal history of other diseases of the digestive system: Z87.19

## 2007-10-22 ENCOUNTER — Ambulatory Visit: Payer: Self-pay | Admitting: Cardiology

## 2007-10-22 ENCOUNTER — Inpatient Hospital Stay (HOSPITAL_COMMUNITY): Admission: EM | Admit: 2007-10-22 | Discharge: 2007-11-03 | Payer: Self-pay | Admitting: Emergency Medicine

## 2007-10-24 ENCOUNTER — Encounter (INDEPENDENT_AMBULATORY_CARE_PROVIDER_SITE_OTHER): Payer: Self-pay | Admitting: Internal Medicine

## 2007-10-30 ENCOUNTER — Encounter (INDEPENDENT_AMBULATORY_CARE_PROVIDER_SITE_OTHER): Payer: Self-pay | Admitting: Gastroenterology

## 2007-12-23 IMAGING — US IR CV CATH FLUORO GUIDE
1 series · 1 of 1 positions shown · non-contrast
Comparison: none

CLINICAL DATA: hypotension
 UPPER EXTREMITY PICC PLACEMENT WITH ULTRASOUND AND FLUORO GUIDANCE:
TECHNIQUE: The right arm was prepped with Betadine, draped in the usual sterile fashion, and infiltrated locally with 1% Lidocaine.  Ultrasound demonstrated patency of the right basilic vein.  Under real-time ultrasound guidance, this vein was accessed with a 21 gauge micropuncture needle.  Ultrasound image documentation was performed.  The needle was exchanged over a guidewire for a peel-away sheath through which a 5 French double lumen PICC catheter trimmed to 45 cm was advanced, positioned with its tip at the distal SVC/right atrial junction.  Fluoroscopy during the procedure and fluoro spot radiograph confirms appropriate catheter position.  The catheter was flushed, secured to the skin with Prolene sutures, and covered with a sterile dressing. No immediate complication.

[Series 1: ir cv cath fluoro guide · 1 of 1 slices shown]
[im 1/1]
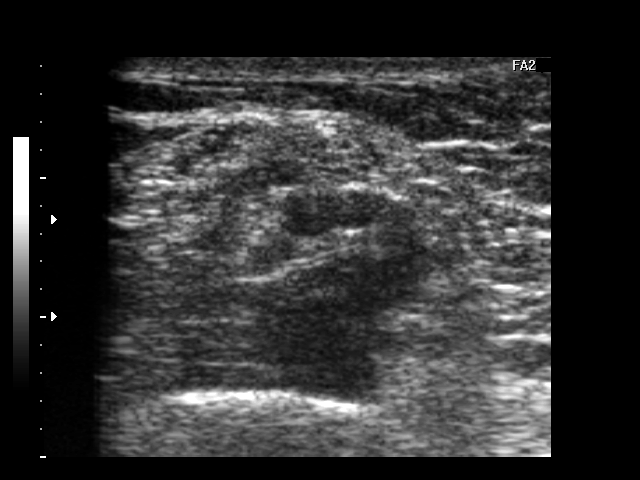

[1 of 1 positions shown; findings below may reference images not displayed]

IMPRESSION: Technically successful right arm PICC placement with ultrasound and fluoroscopic guidance.  Ready for routine use.

## 2007-12-24 IMAGING — CR DG CHEST 1V PORT
1 series · 1 of 1 positions shown · non-contrast
Comparison: 08/25/04.

CLINICAL DATA: Sepsis.  
 PORTABLE CHEST ? 1411 HOURS:

[view not recorded]
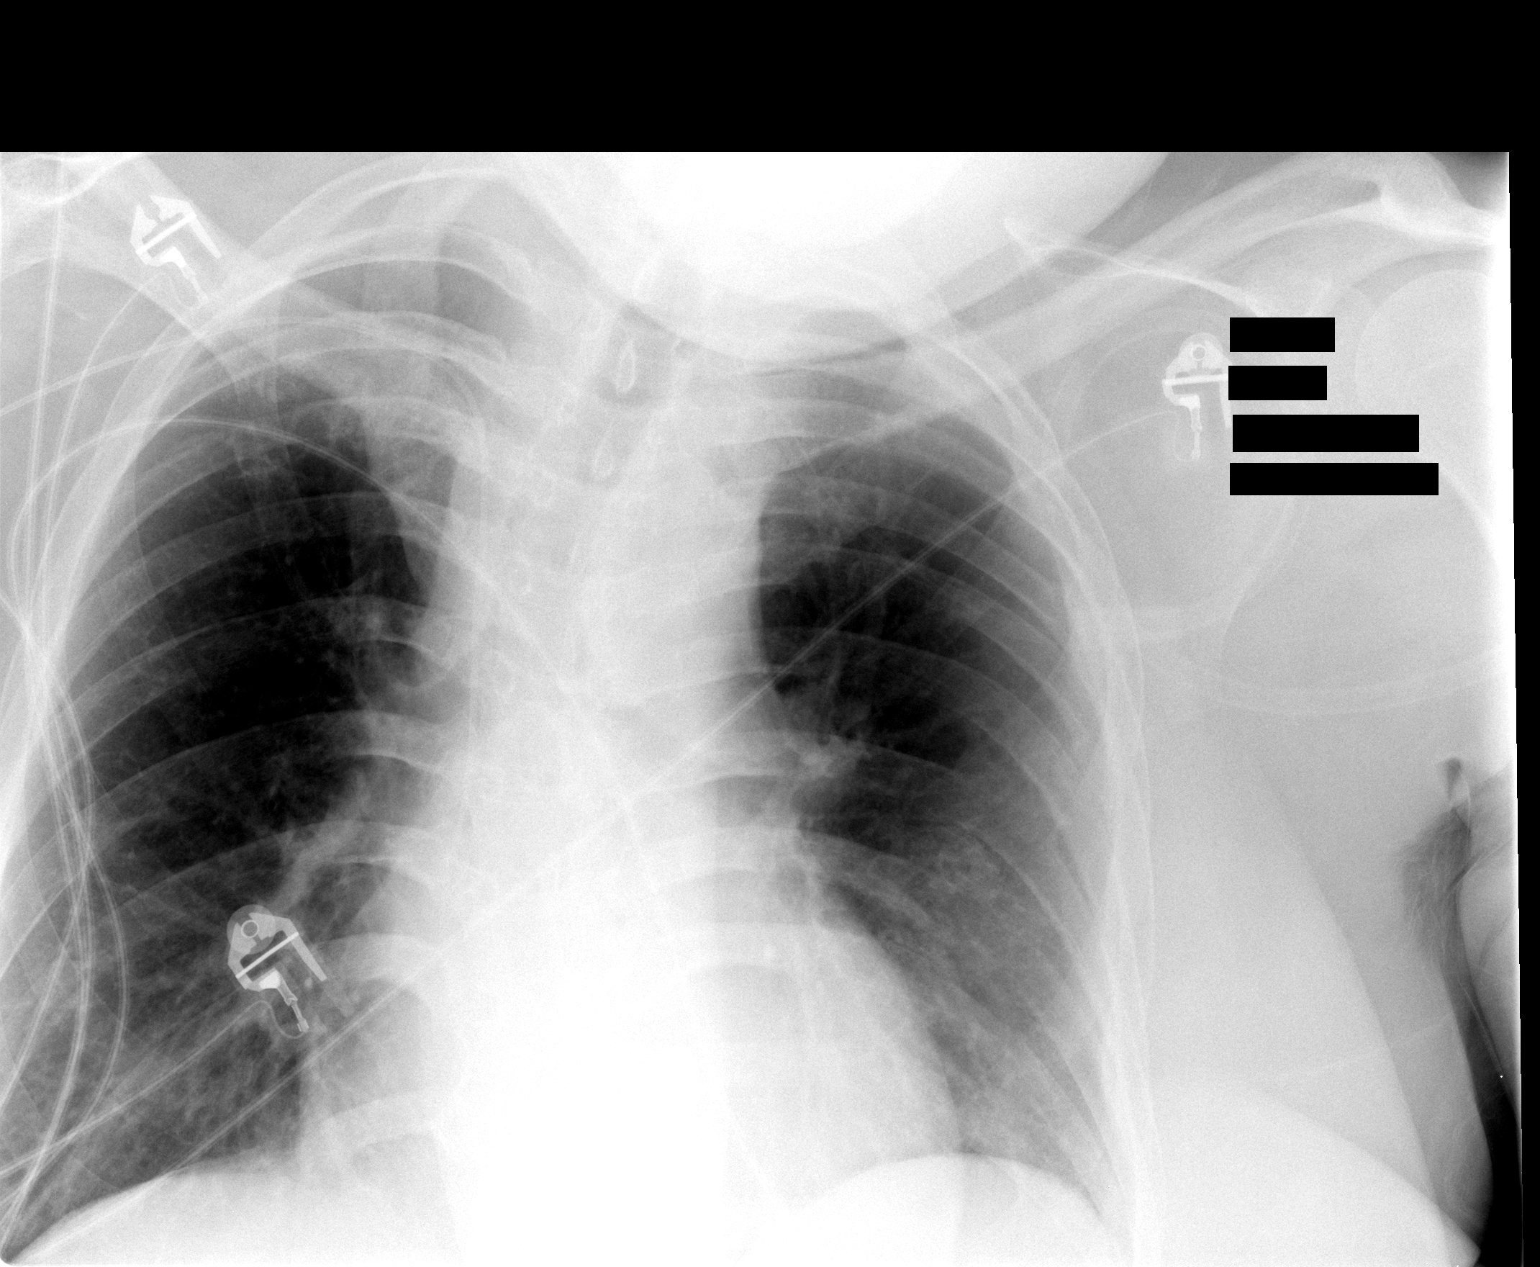

[1 of 1 positions shown; findings below may reference images not displayed]

FINDINGS: Lungs well expanded and clear of an active process.  Cardiomegaly.  No vascular congestion.  PICC line enters via right upper extremity approach.  Tip is in the distal SVC.
IMPRESSION: As above.

## 2007-12-30 IMAGING — CT CT ANGIO CHEST
3 of 5 series · 18 of 36 positions shown · IV contrast (APPLIED)
Comparison: Plain film earlier today and 05/12/05.

CLINICAL DATA: 39 year-old-male quadriplegic with shortness of breath, rule out pulmonary embolism.   
CHEST CT ANGIO WITH CONTRAST:
TECHNIQUE: Multidetector CT imaging of the chest was performed during bolus injection of intravenous contrast.  Multiplanar CT angiographic image reconstructions were generated to evaluate the vascular anatomy.
Contrast:  100 cc Omnipaque 300.

[Series 4: pulm embolism 2.0 b31f st · axial · 0.71mm/px · z∈[-314,-28]mm · 12 of 170 slices shown]
[im 14/170  lung]
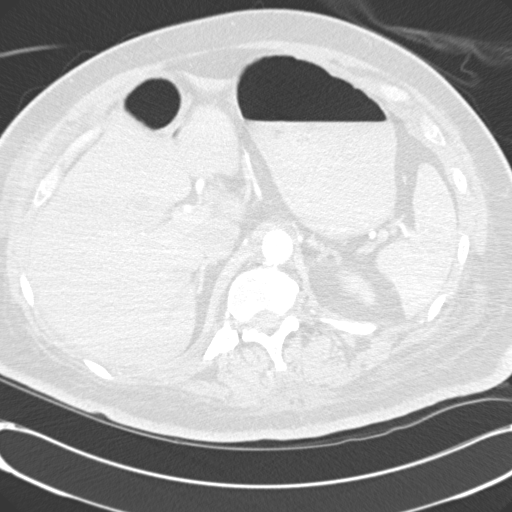
[im 27/170  mediastinal]
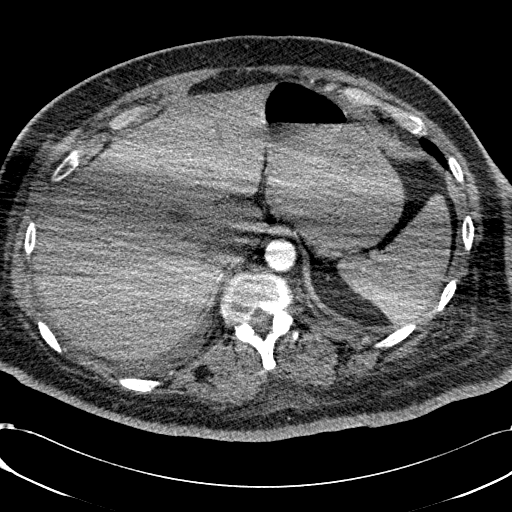
[im 40/170  lung]
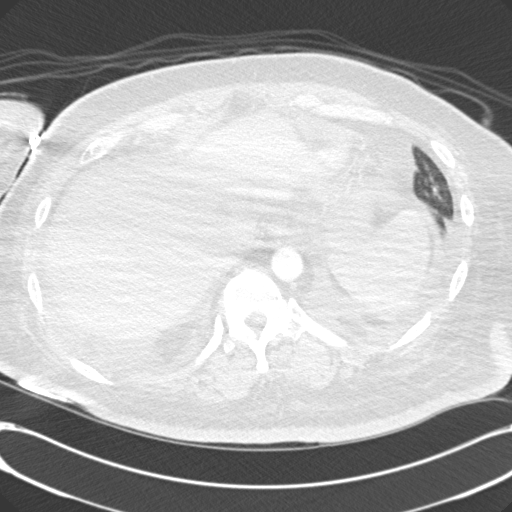
[im 53/170  mediastinal]
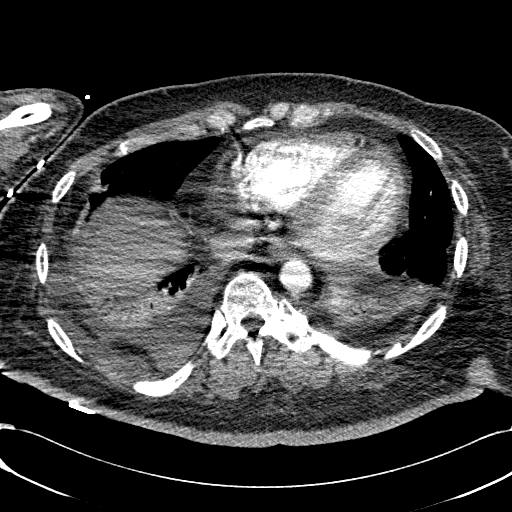
[im 66/170  lung]
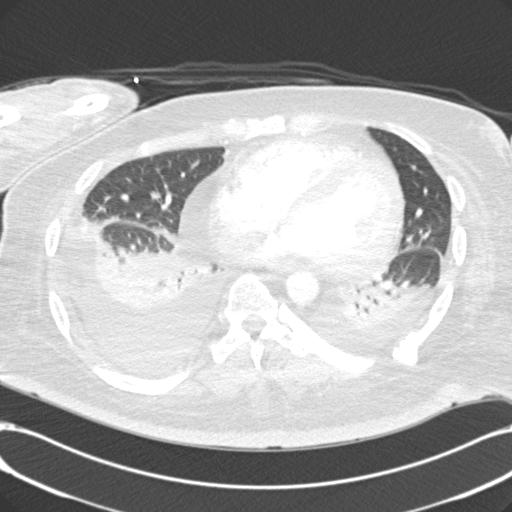
[im 79/170  mediastinal]
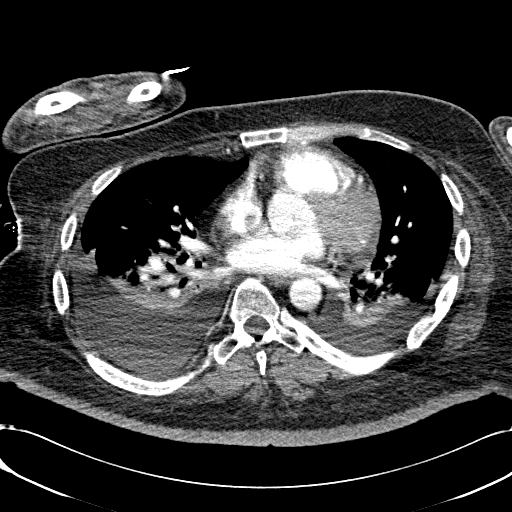
[im 92/170  lung]
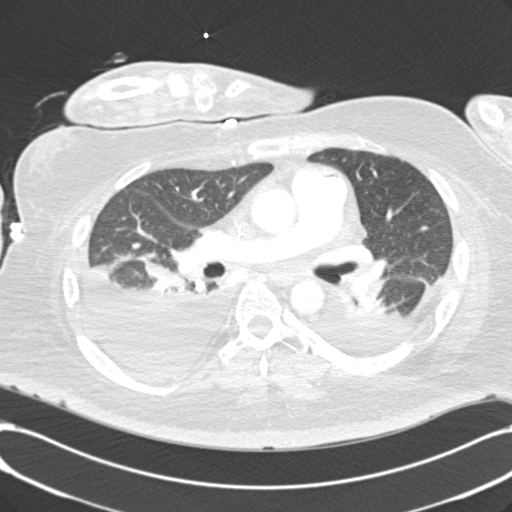
[im 105/170  mediastinal]
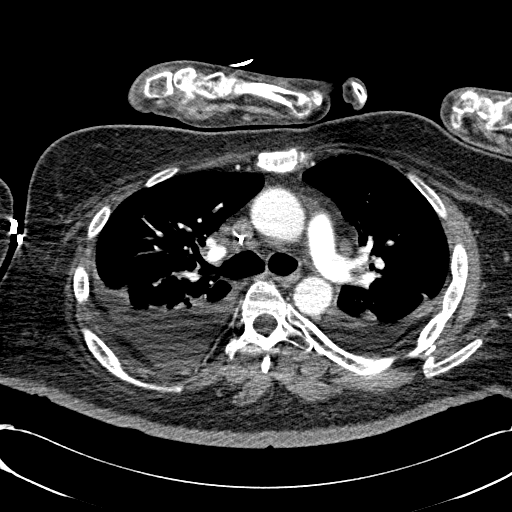
[im 118/170  lung]
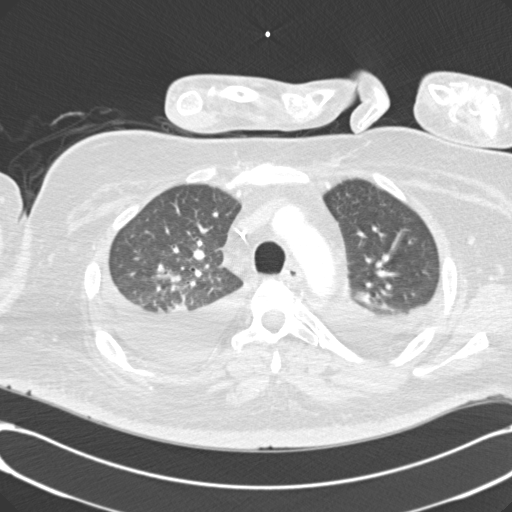
[im 131/170  mediastinal]
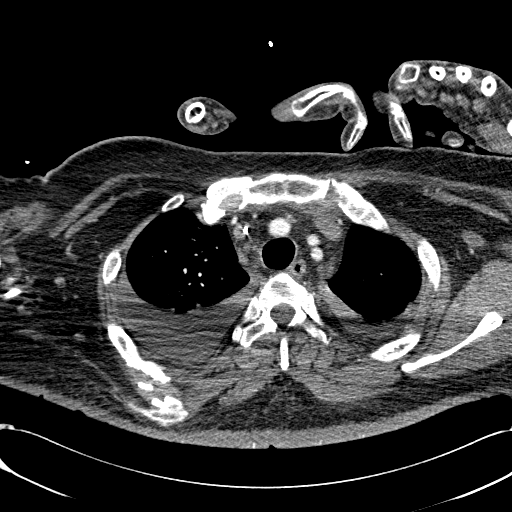
[im 144/170  lung]
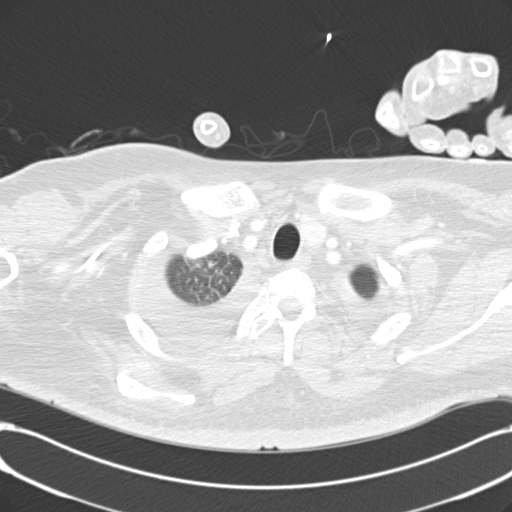
[im 157/170  mediastinal]
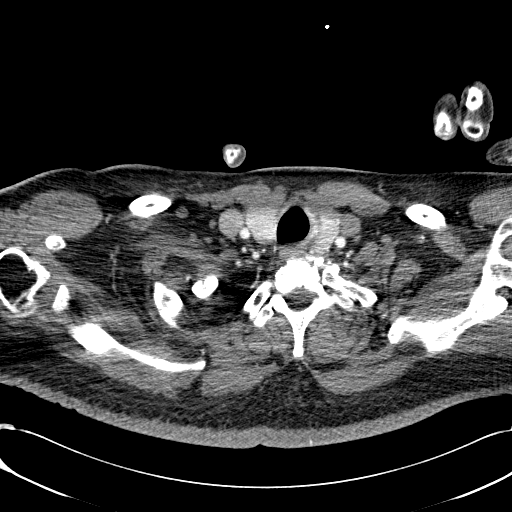

[Series 5: pulm embolism 2.0 b60f lung · axial · 0.71mm/px · z∈[-292,-214]mm · 3 of 159 slices shown]
[im 14/159  mediastinal]
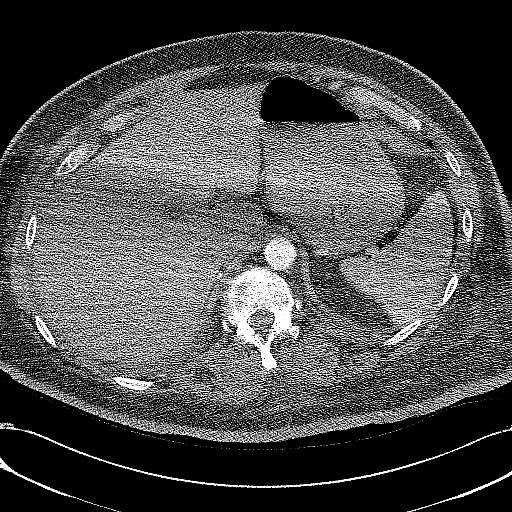
[im 40/159  mediastinal]
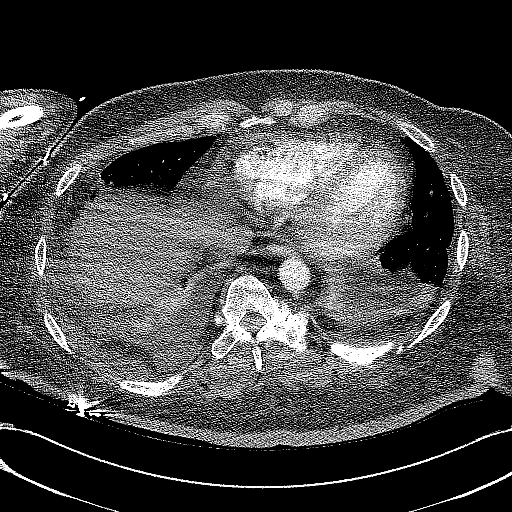
[im 53/159  mediastinal]
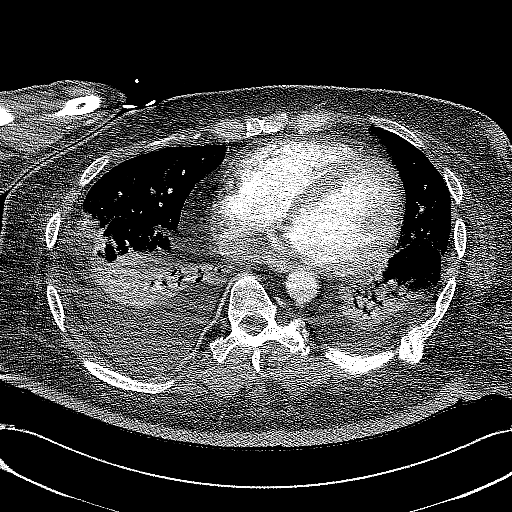

[Series 7: pulm embolism 1.5 spo cor thins · coronal · 0.71mm/px · 3 of 126 slices shown]
[im 26/126  mediastinal]
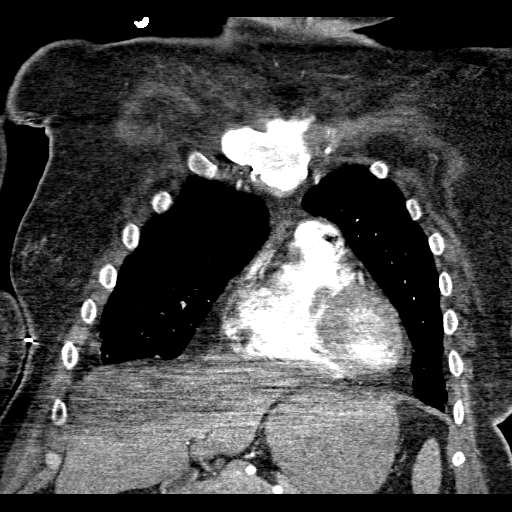
[im 51/126  mediastinal]
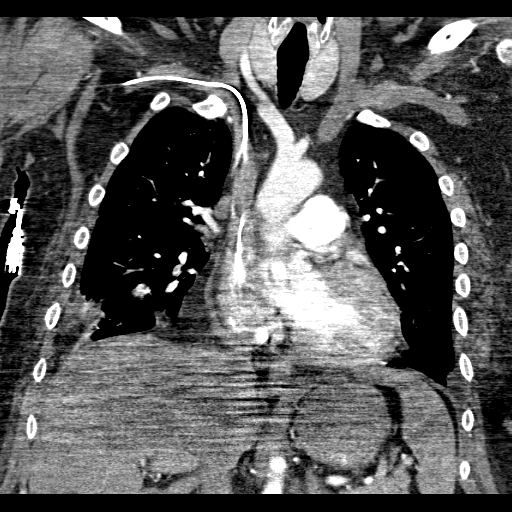
[im 76/126  mediastinal]
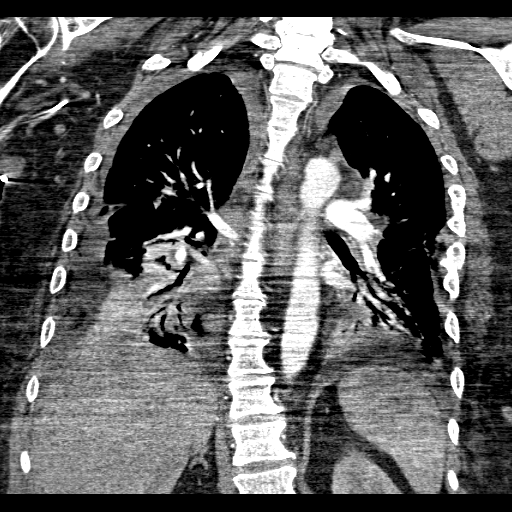

[18 of 36 positions shown; findings below may reference images not displayed]

FINDINGS: Lung windows demonstrate mild intralobular septal thickening at the apices, primarily on the right could represent pulmonary venous congestion.  Minimal subpleural irregularity right middle lobe (image 116).  There is right base collapse/consolidative change.  There is left base dependent airspace disease most consistent with atelectasis.  
Soft tissue windows demonstrate moderate degradation due to the patient?s arms at his side and the size of pleural effusions. Exam is diagnostic to the segmental level.  There is an apparent suboptimal pulmonary artery branch enhancement of the right middle lobe on image 95 and 96. These are felt insufficient to confirm pulmonary embolism given their small size and motion artifact in this area.  
The thyroid is prominent and extends minimally into the upper left chest. The thoracic aorta is of normal caliber and there is no evidence of dissection.  The heart is mildly enlarged. There is a moderate right and a small left pleural effusion which are simple appearing. There are small mediastinal lymph nodes which are likely reactive.  There is right hilar adenopathy with a  lymph node measuring 2.0 cm. The prominent left hilar and suprahilar lymph nodes which are less impressive than the right-sided findings.  Irregularity of the right posterior pleural space (example image 123) is likely related to extrapleural fat proliferation.  
Bone windows demonstrate remote trauma to the inferior right scapula. There is also remote trauma to the left-sided ribs.  Spinal curvature identified.
IMPRESSION: 1.  The exam is limited due to patient related factors described above.  
2.  No evidence of central or large lobar/segmental pulmonary embolism.  
3.  Moderate right and small left pleural effusion.  
4.  Right hilar adenopathy (2.0 cm)  is most likely reactive. CT follow-up is recommended to confirm resolution when the patient is clinically improved. 
5.  Increased number of mediastinal lymph nodes is likely also reactive.
6.  Bibasilar airspace disease.  On the left, this is most consistent with atelectasis. The right-sided disease is likely atelectasis but early infection could have this appearance.

## 2007-12-30 IMAGING — CR DG CHEST 1V PORT
1 series · 1 of 1 positions shown · non-contrast
Comparison: 05/12/05.

CLINICAL DATA: PICC placement. 
 PORTABLE CHEST - 1 VIEW:

[view not recorded]
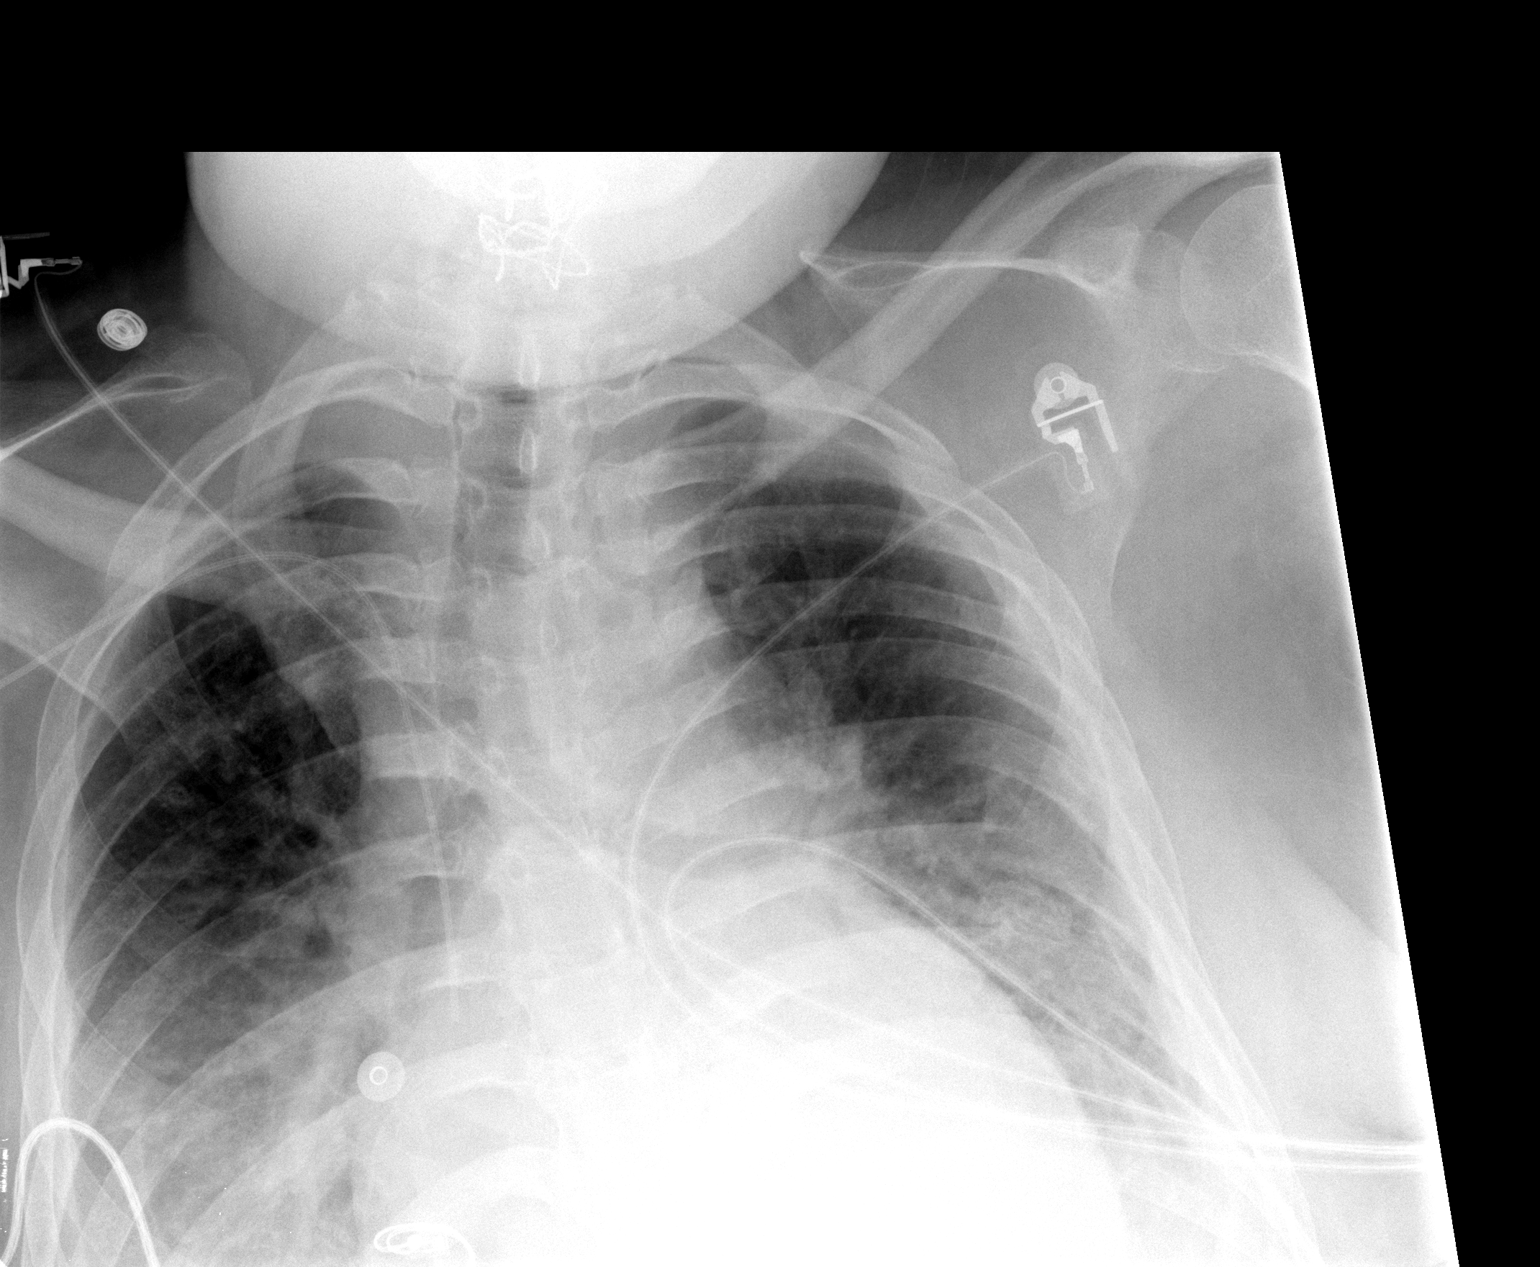

[1 of 1 positions shown; findings below may reference images not displayed]

FINDINGS: Right upper extremity PICC has been placed with its tip at the cavoatrial junction.  The lung bases are not completely included on the film.  There is probable new bibasilar atelectasis or pneumonia.   Recommend repeat film centering lower.
IMPRESSION: 1.  Right upper extremity PICC to the cavoatrial junction.  
 2.  Probable increased bilateral lower lobe atelectasis or pneumonia.  Recommend repeat films centered lower.

## 2008-01-01 IMAGING — CR DG CHEST 1V PORT
2 series · 2 of 2 positions shown · non-contrast
Comparison: 05/17/05.

CLINICAL DATA: Sepsis, shortness of breath, cough, and hypertension.
 PORTABLE CHEST - 1 VIEW - 05/19/05:

[view not recorded (1 of 2)]
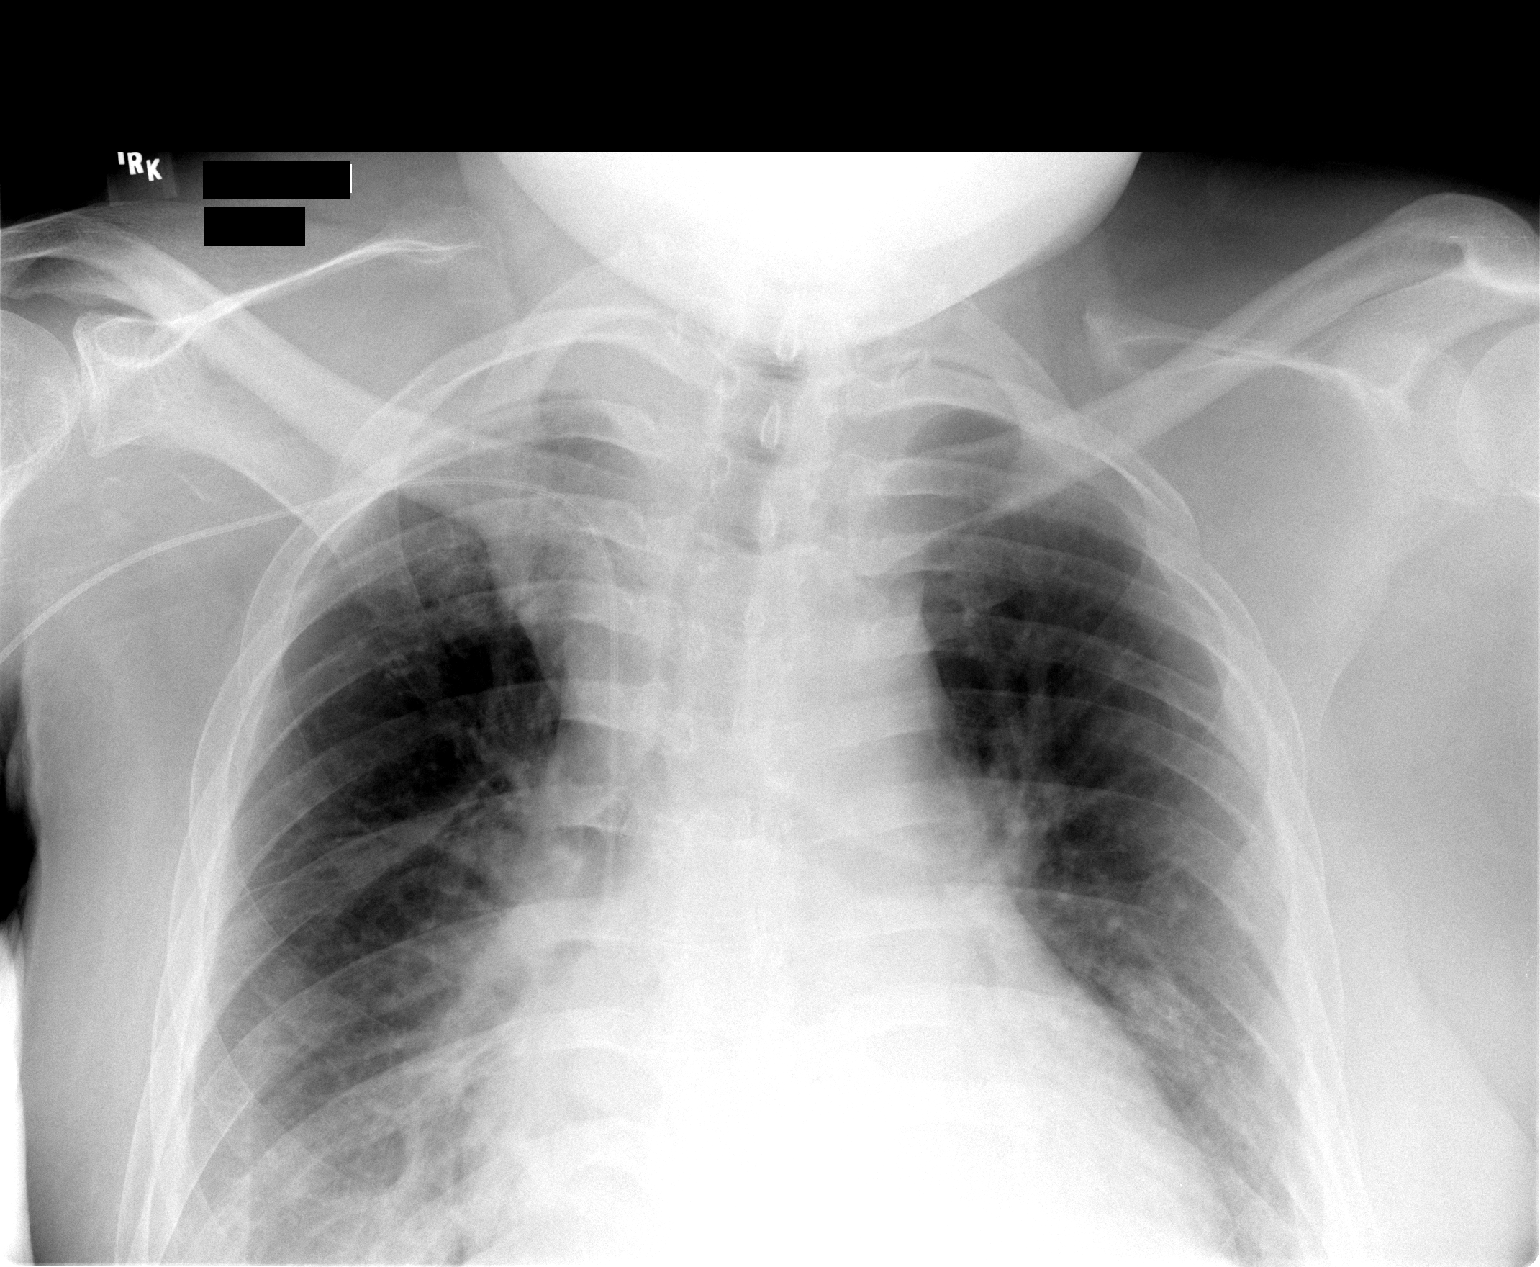

[view not recorded (2 of 2)]
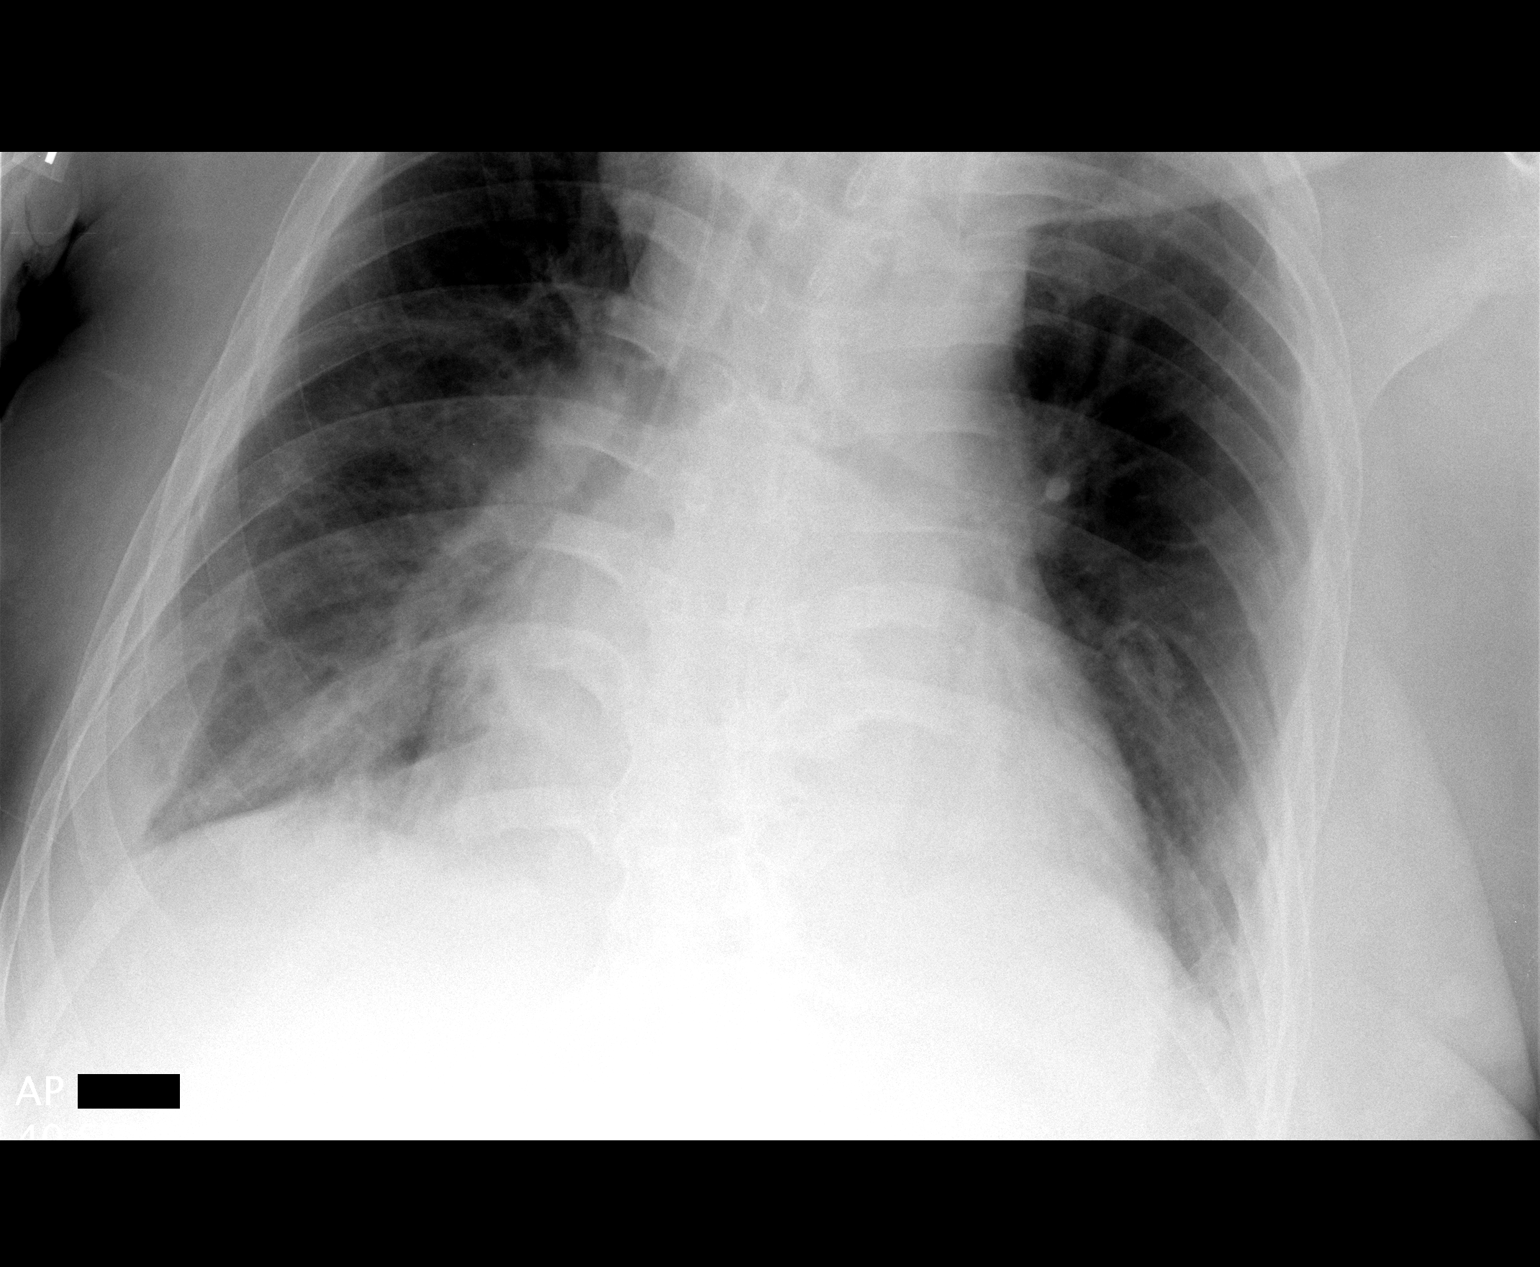

[2 of 2 positions shown; findings below may reference images not displayed]

FINDINGS: A right-sided PICC line is noted with the tip in the projection of the cavoatrial junction.  
 There is marked cardiomegaly.  Right lower lobe atelectasis is noted as well as a small right subpleural fluid collection.
IMPRESSION: 1.  Cardiomegaly but no failure.
 2.  Right lower lobe atelectasis/infiltrate.
 3.  Stable right effusion.

## 2008-01-02 IMAGING — CR DG ABDOMEN 1V
1 series · 1 of 1 positions shown · non-contrast
Comparison: 06/25/2003.

CLINICAL DATA: Abdominal distention. Sepsis.

ABDOMEN - 1 VIEW

[t abdomen supine]
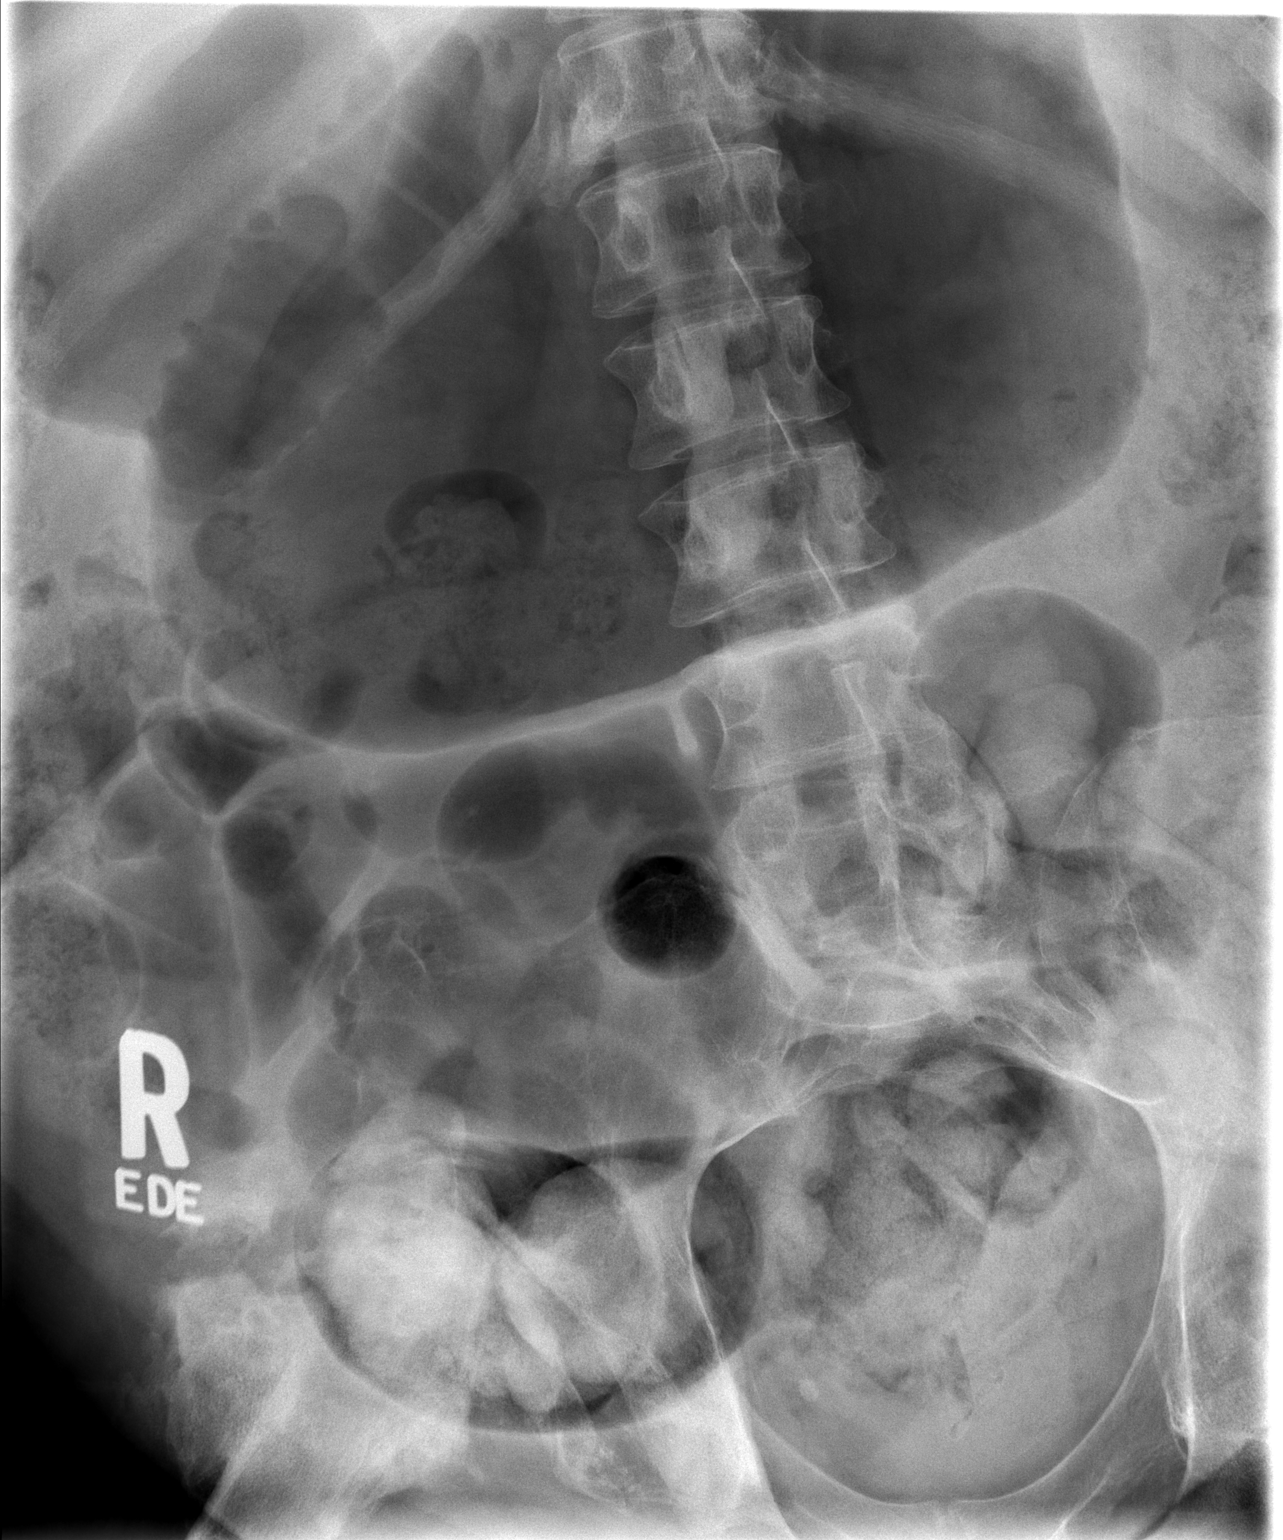

[1 of 1 positions shown; findings below may reference images not displayed]

FINDINGS: Interval gaseous dilatation of the stomach. Dilated small bowel loop
in the right upper abdomen. Less dilated gas filled small bowel loops in the
right lower abdomen. Stool throughout normal caliber colon. Prominent gas filled
loop of colon containing stool, overlying the lateral aspect of the right
pelvis. This has an appearance suggesting colon within a ventral hernia. This is
obscuring the right hip with superior dislocation of the femur noted with no
well-defined femoral head and adjacent calcific densities, especially laterally,
previously seen. The left hip is not currently included.
IMPRESSION: 1. Gastric and small bowel ileus or partial obstruction.

2. Possible right lower ventral hernia containing a loop of colon, as described
above. If this is a clinical concern, this could be better delineated with an
abdomen and pelvis CT.

3. Stool throughout the colon.

4. Chronic right hip dislocation and deformity with associated myositis
ossificans.

## 2008-01-03 IMAGING — CR DG ABDOMEN 1V
2 series · 2 of 2 positions shown · non-contrast
Comparison: none

CLINICAL DATA: Abdominal distention

Abdomen one view:
Comparison 05/20/2005. There is marked gaseous distention of the stomach,
appearing slightly increased compared to previous film. Small bowel is
decompressed. Moderate amount of fecal material throughout the nondistended
colon. Advanced degenerative change noted in the right hip.

[t abdomen supine (1 of 2)]
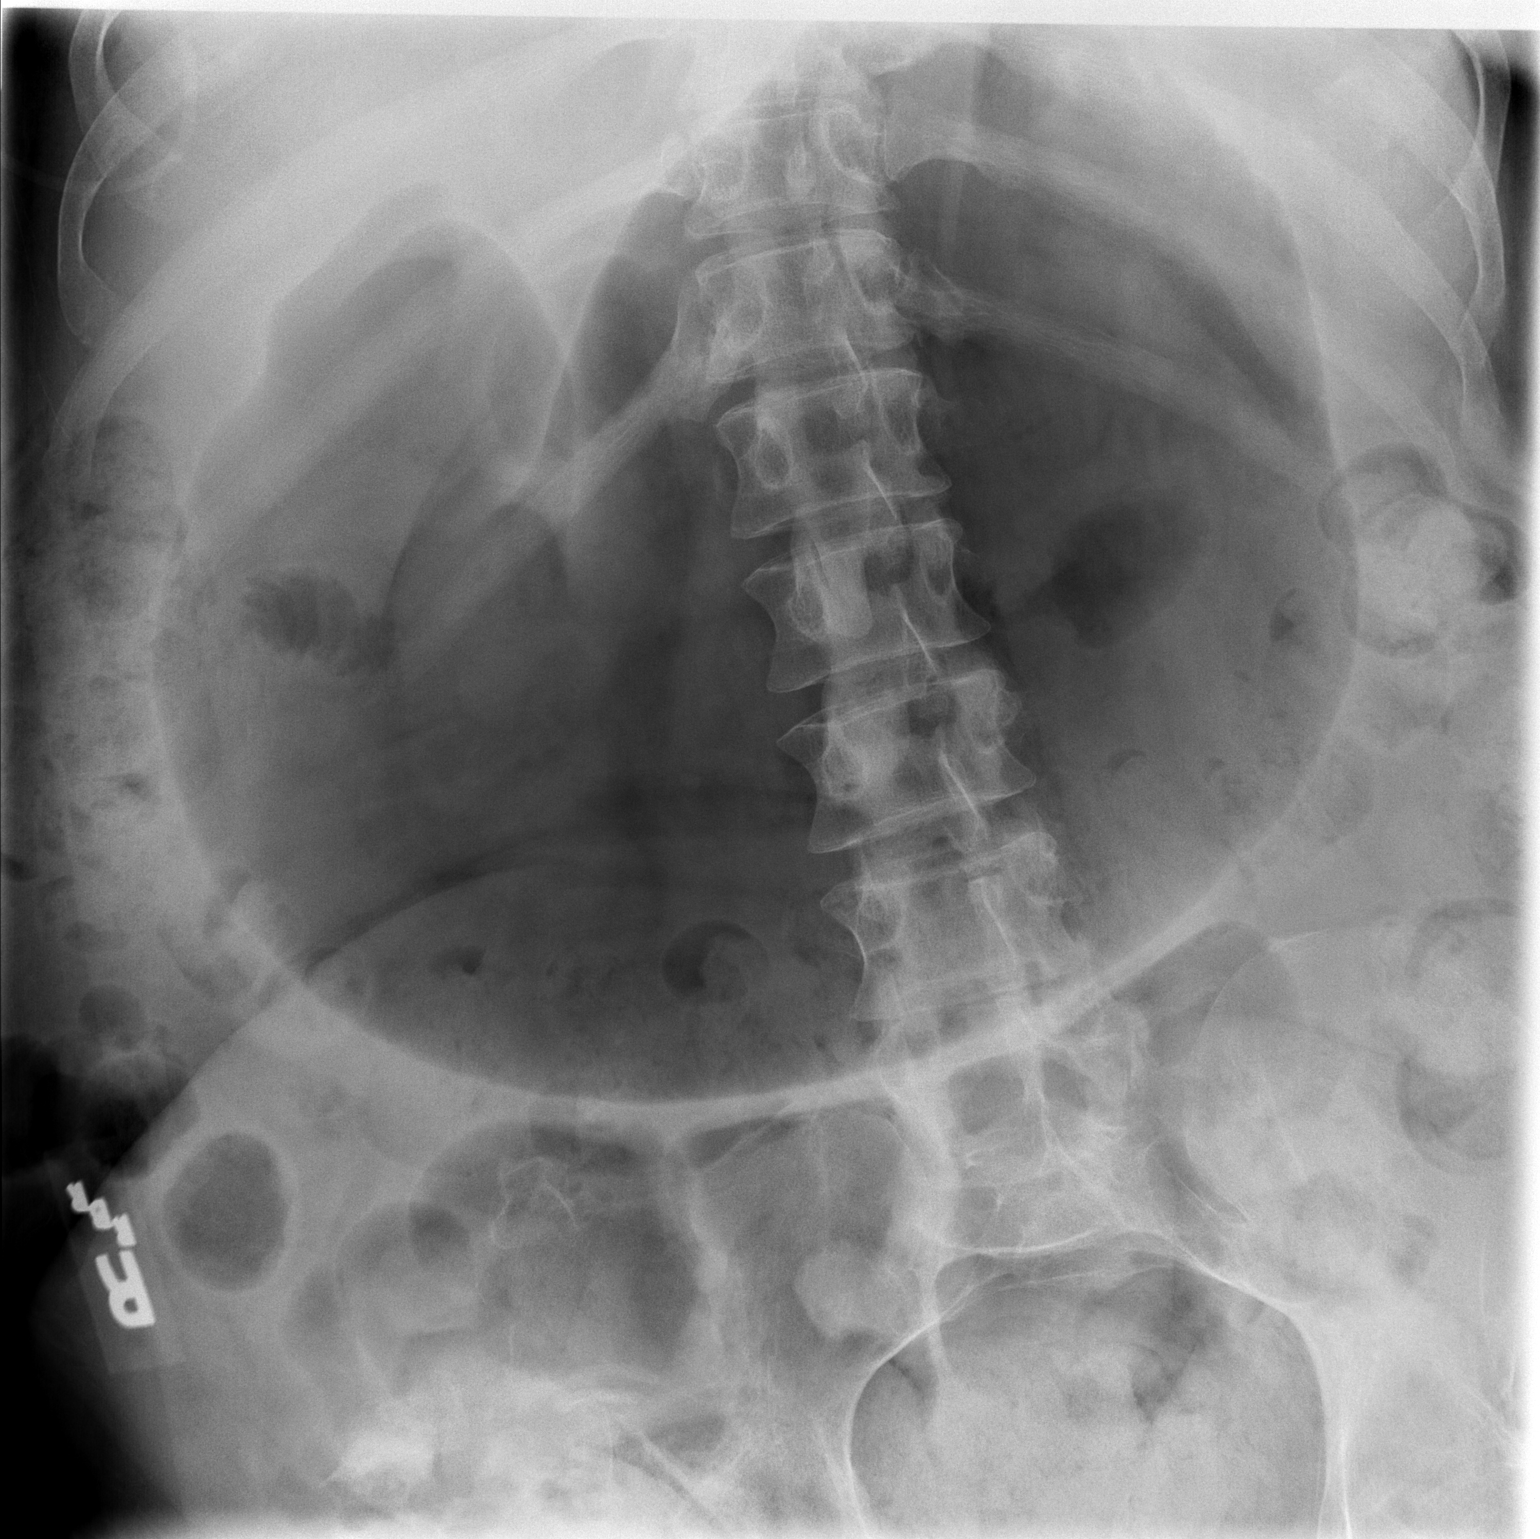

[t abdomen supine (2 of 2)]
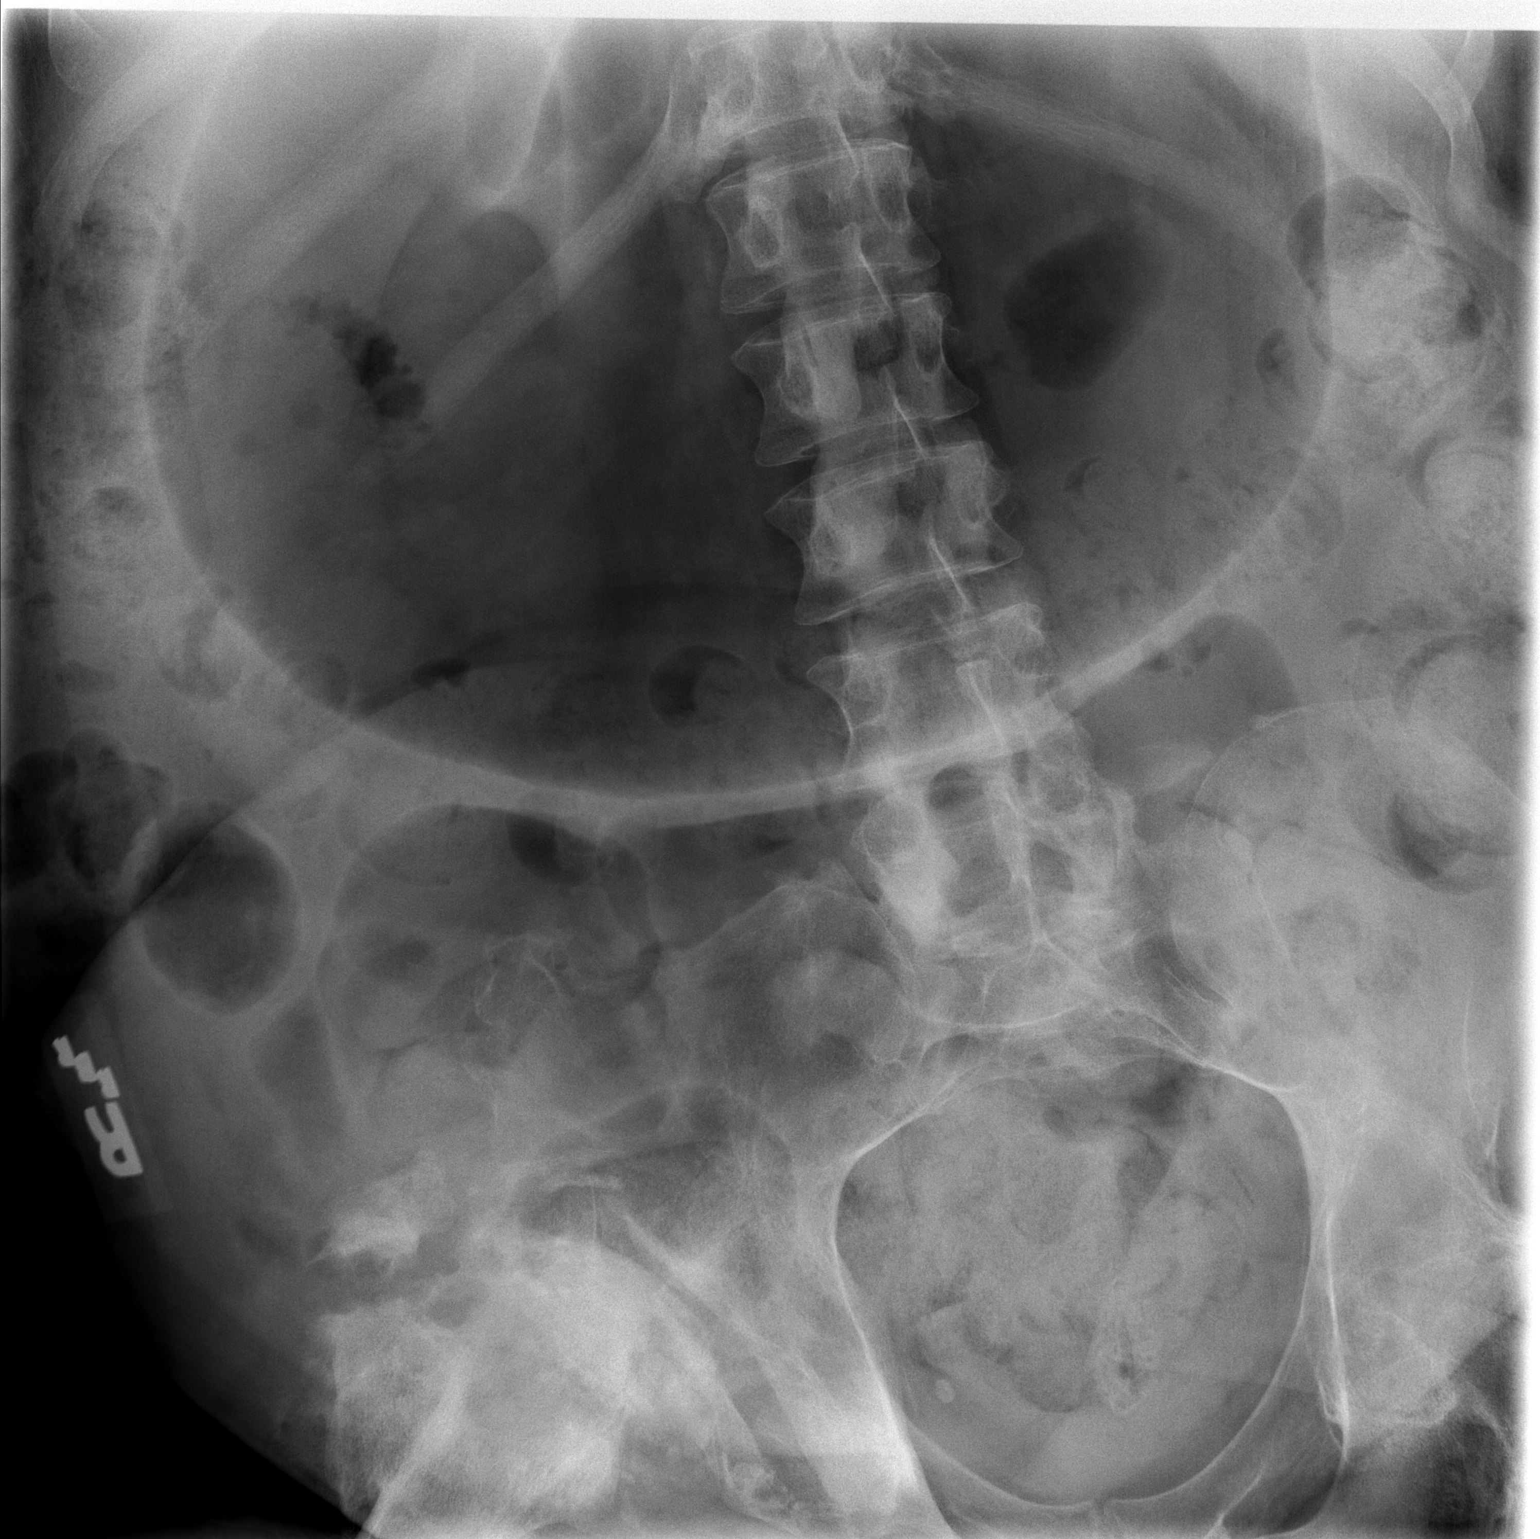

[2 of 2 positions shown; findings below may reference images not displayed]

IMPRESSION: 1. Some interval worsening of   gastric distention.

## 2008-01-05 IMAGING — CR DG ABD PORTABLE 1V
1 series · 1 of 1 positions shown · non-contrast
Comparison: 05/21/05.

CLINICAL DATA: Nausea, decreased bowel activity.  History of intestinal obstruction. 
 PORTABLE ABDOMEN ? 05/23/05:

[view not recorded]
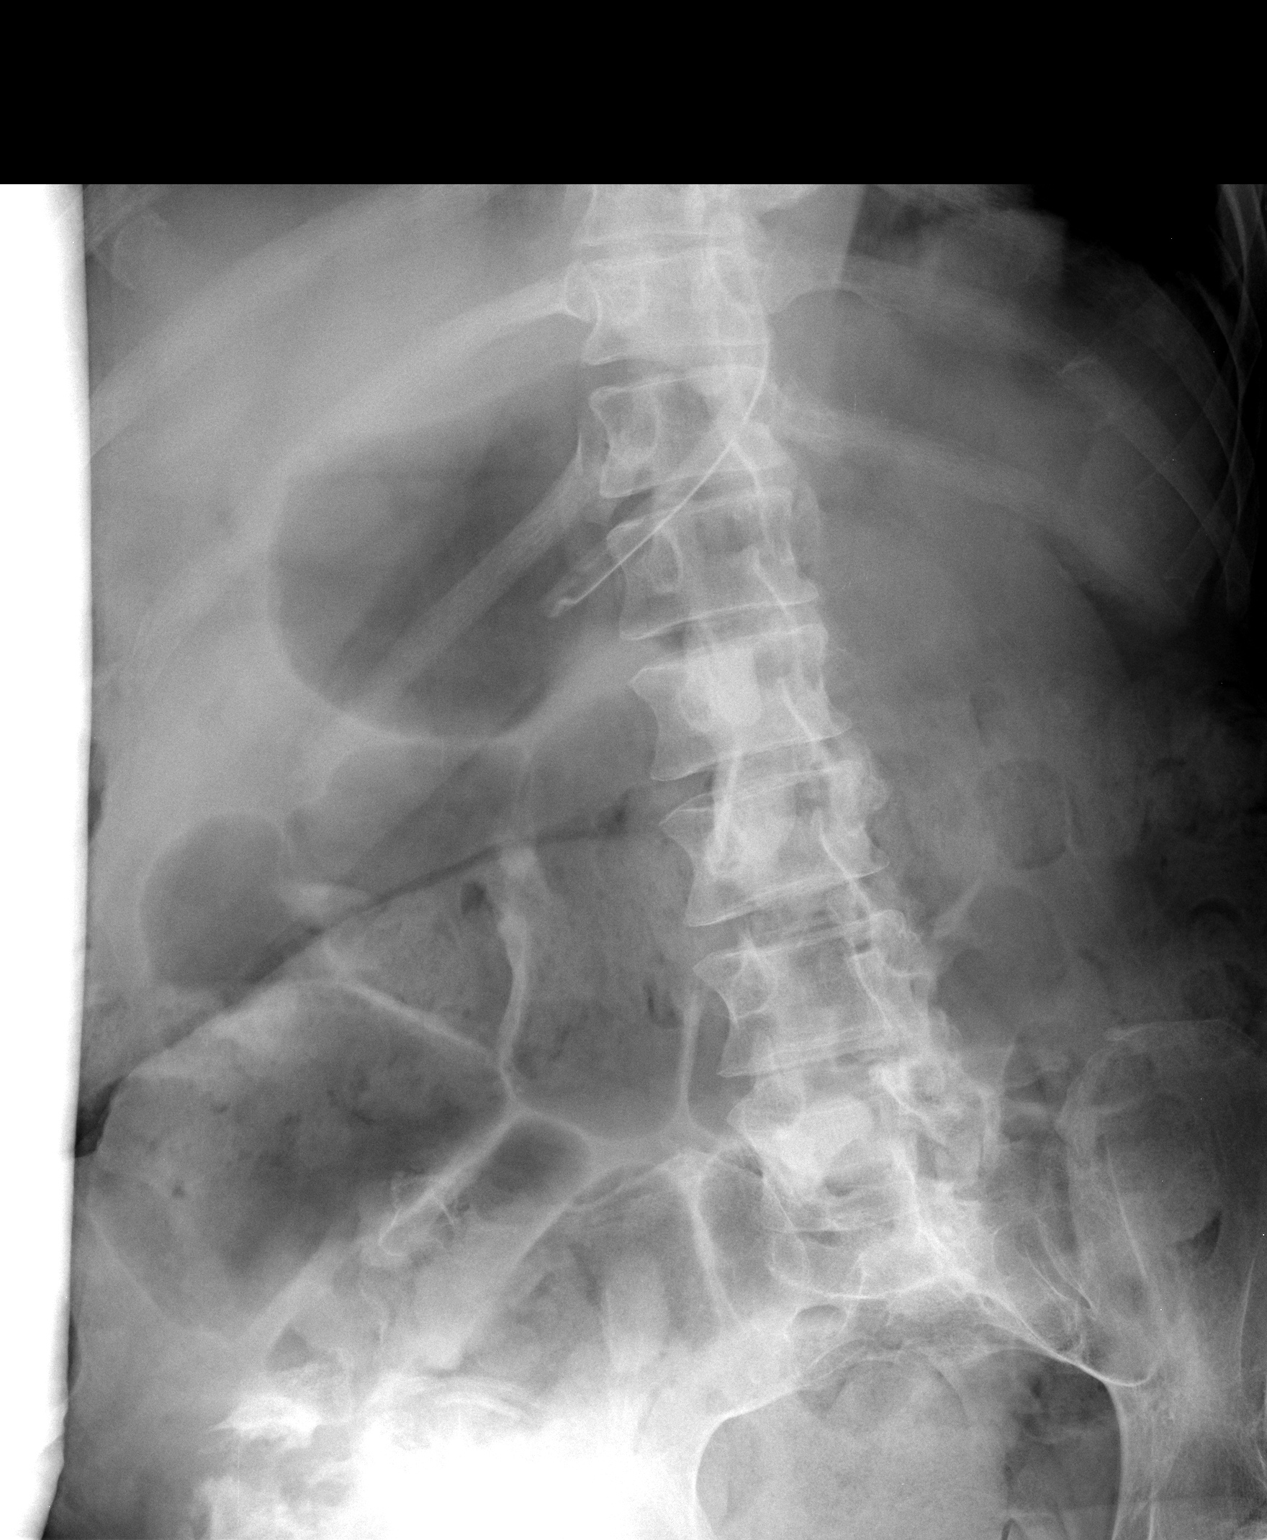

[1 of 1 positions shown; findings below may reference images not displayed]

NG tube has been placed and there has been a decrease in degree of gastric distention. Bowel gas is noted throughout large and small bowel.
IMPRESSION: Decrease in gastric distention after placement of NG tube.

## 2008-01-06 IMAGING — CR DG ABD PORTABLE 1V
1 series · 1 of 1 positions shown · non-contrast
Comparison: 05/23/05.

CLINICAL DATA: Sepsis. 
 PORTABLE ABDOMEN ? 1 VIEW 05/24/05 AT 4575 HOURS:

[view not recorded]
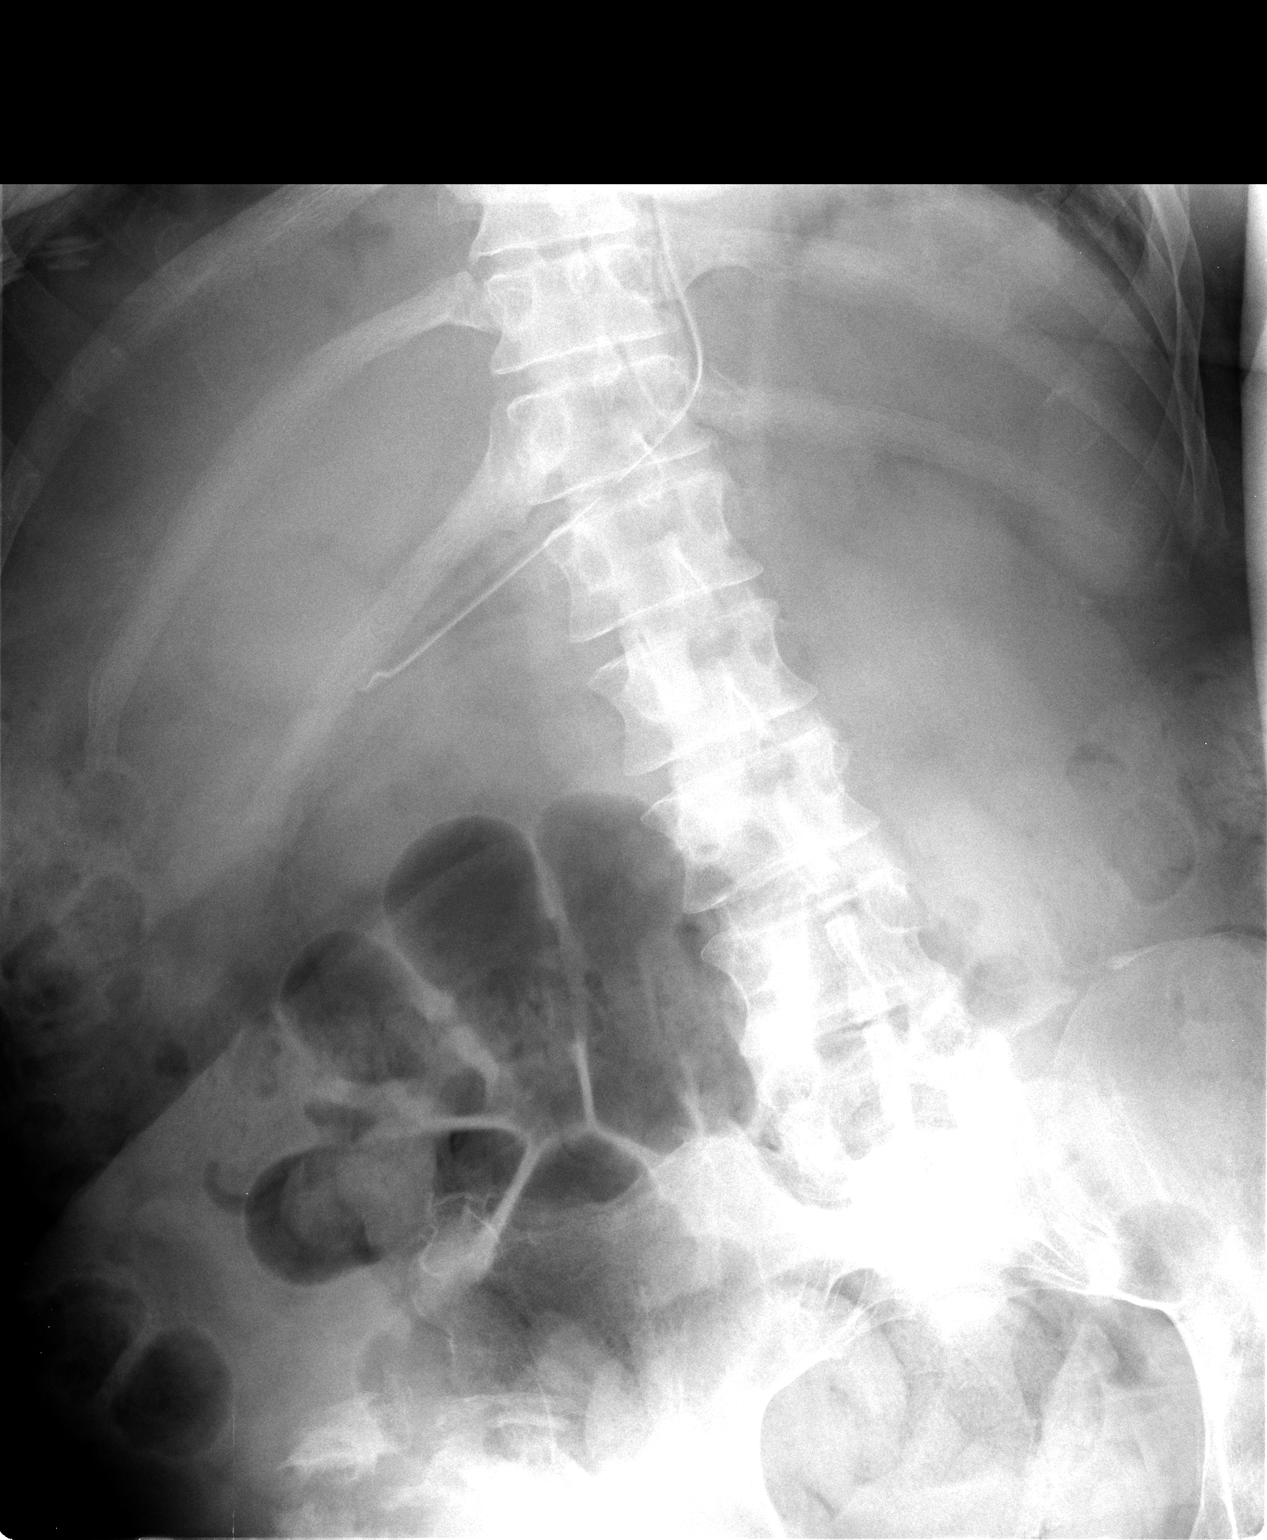

[1 of 1 positions shown; findings below may reference images not displayed]

FINDINGS: NG tube remains in the antrum of the stomach.  The stomach is now decompressed.  There is gaseous distention of the colon which is overall improved from yesterday.  The transverse colon measures approximately 9.5 cm in diameter.  There is constipation.
IMPRESSION: Improvement in colonic dilatation.

## 2008-01-07 IMAGING — CR DG CHEST 1V PORT
1 series · 1 of 1 positions shown · non-contrast
Comparison: 05/19/05.

CLINICAL DATA: Sepsis. 
 PORTABLE CHEST - 1 VIEW 05/25/05 AT 6070 HOURS:

[view not recorded]
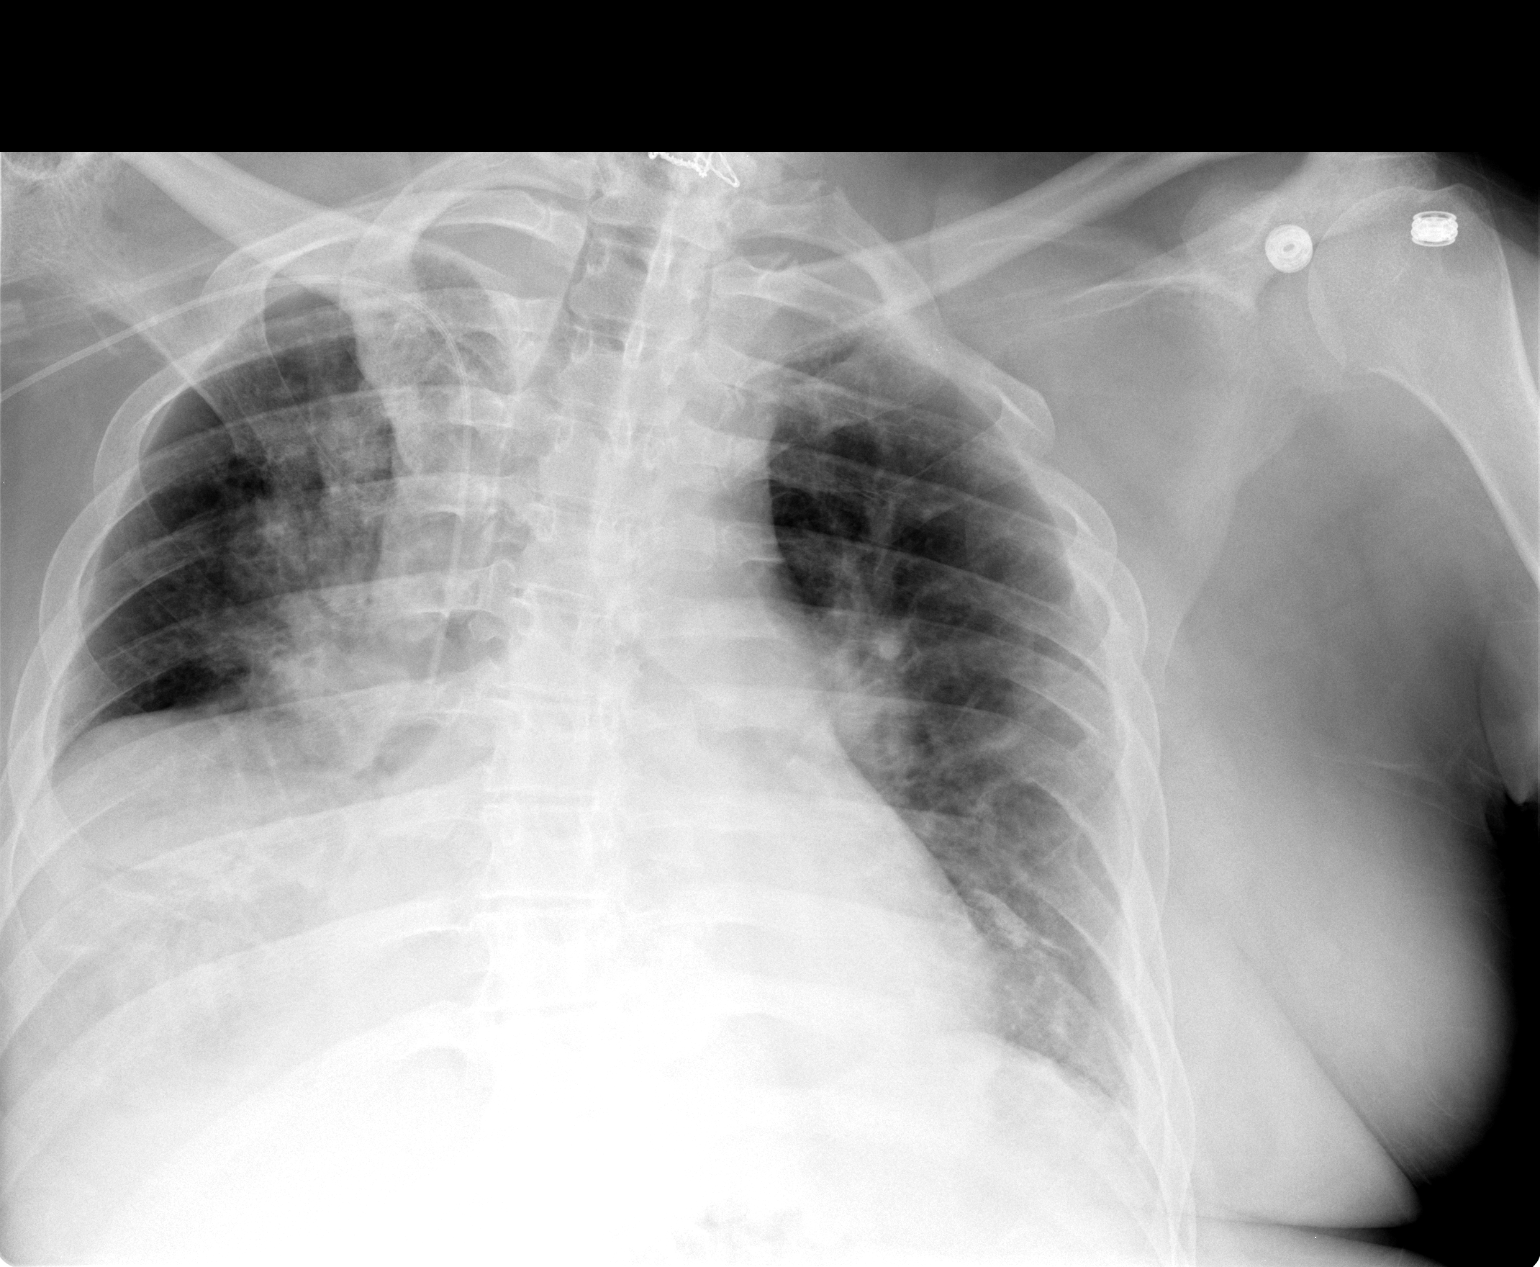

[1 of 1 positions shown; findings below may reference images not displayed]

FINDINGS: There is new opacity in the right perihilar region and possibly at the right lung base suggestive of pneumonia and possible right effusion.  The left lung is clear.  Mild cardiomegaly is stable.  PICC line is unchanged.
IMPRESSION: New opacity in the right perihilar region and right lung base.  Probable pneumonia and/or possible right effusion with elevation of the right hemidiaphragm.

## 2008-01-08 IMAGING — CR DG CHEST 1V PORT
1 series · 1 of 1 positions shown · non-contrast
Comparison: 05/26/05

CLINICAL DATA: 39 year-old-male with sepsis, endotracheal tube placement. 
 PORTABLE CHEST ? 05/26/05:

[view not recorded]
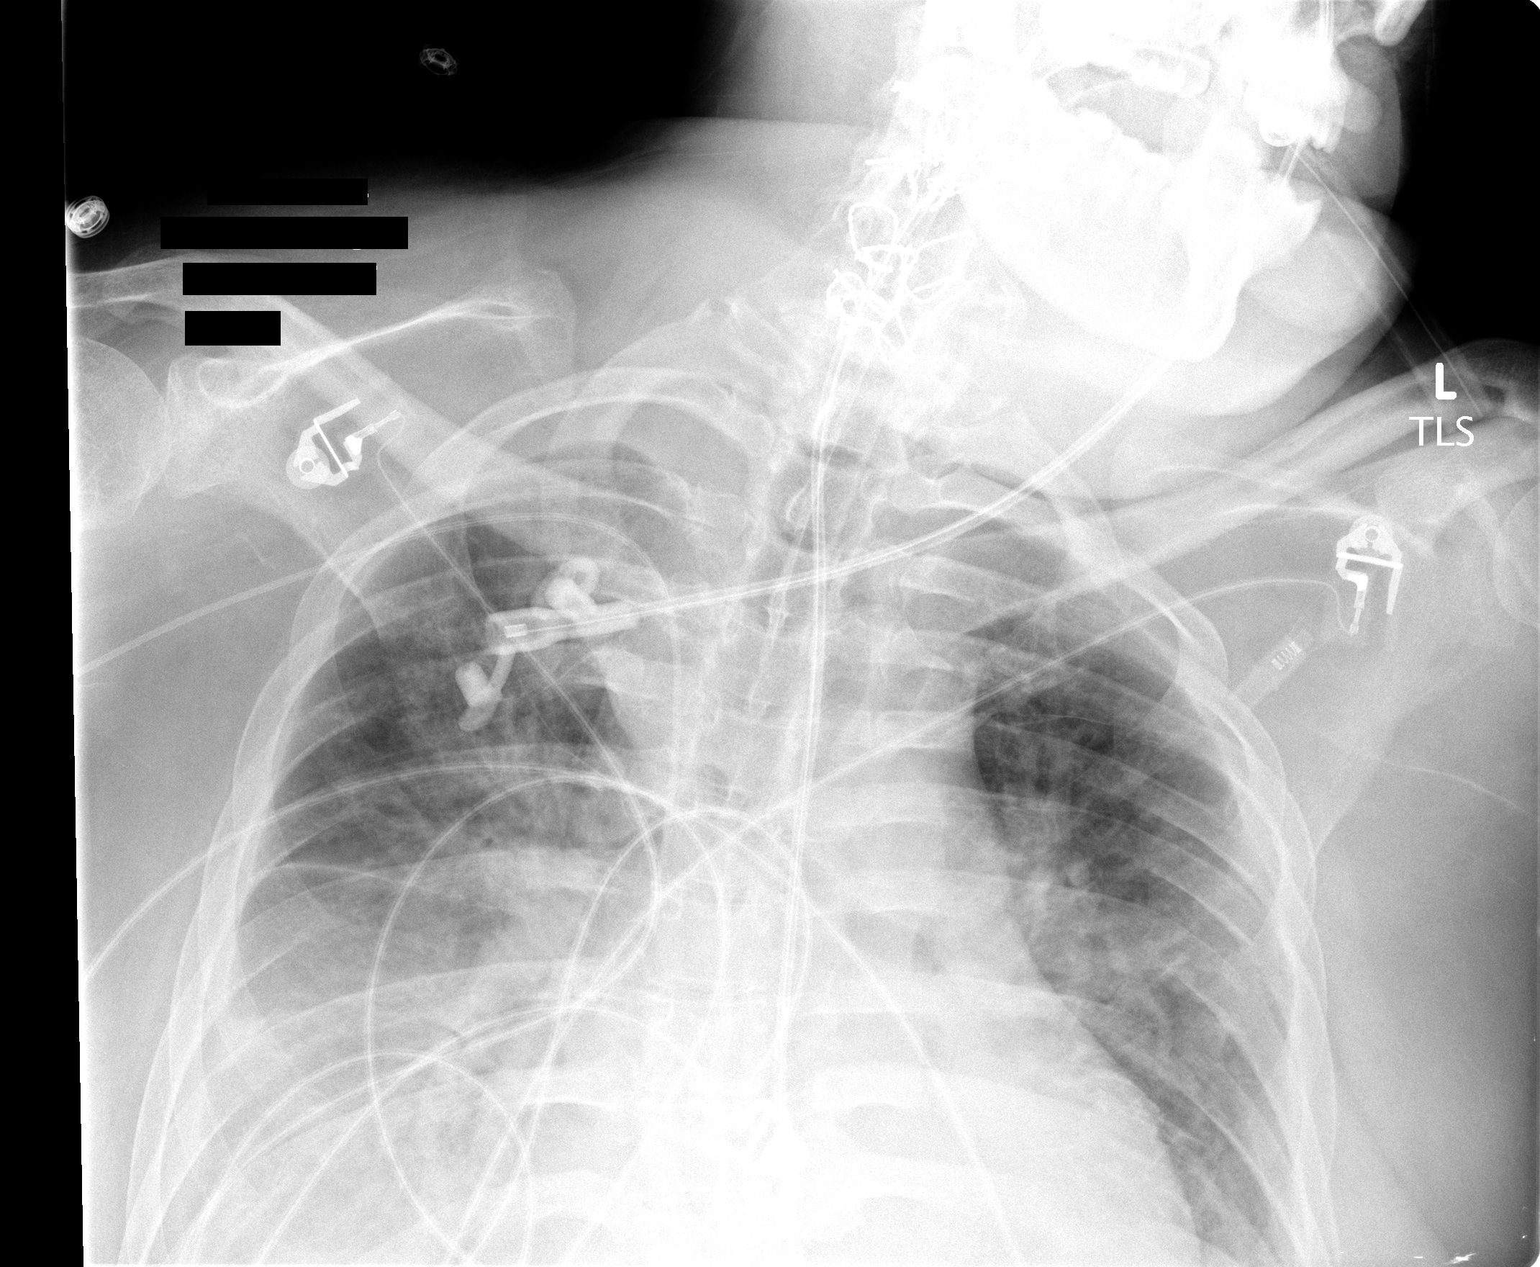

[1 of 1 positions shown; findings below may reference images not displayed]

FINDINGS: There has Ave interval placement of the endotracheal tube which is in good position just above the mid tracheal level.  Right PICC line is stable.   The lungs are slightly better aerated.  There is persistent bilateral airspace process and bilateral effusions.
IMPRESSION: 1. Endotracheal tube in good position. 
 2.  Slight improved aeration.  Persistent bilateral airspace process, likely edema and effusions.

## 2008-01-08 IMAGING — CR DG CHEST 1V PORT
1 series · 1 of 1 positions shown · non-contrast
Comparison: 05/25/2005.

CLINICAL DATA: Sepsis/respiratory distress. 
 PORTABLE CHEST, ONE VIEW ? 05/26/2005:

[view not recorded]
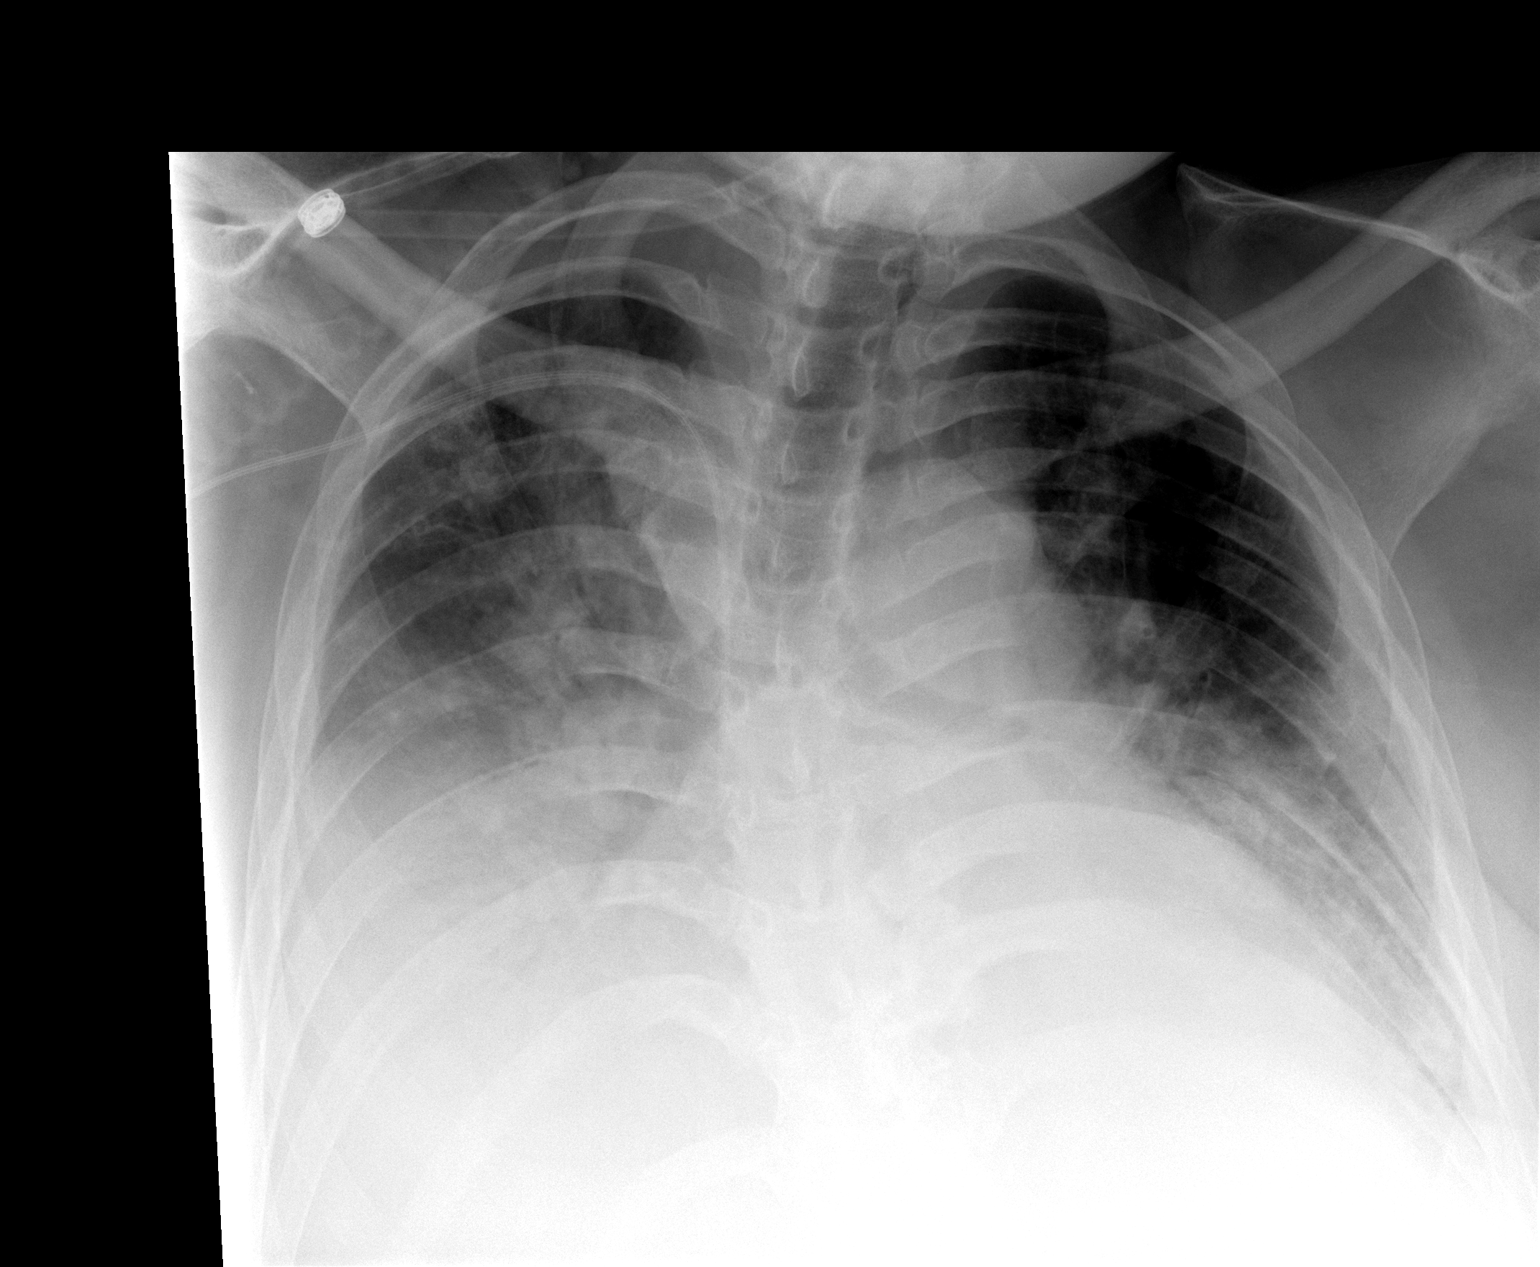

[1 of 1 positions shown; findings below may reference images not displayed]

FINDINGS: Worsened bilateral air space disease most consistent with pneumonia.  There is also a probable right effusion.
IMPRESSION: Worsening bilateral air space disease.

## 2008-01-09 IMAGING — CR DG CHEST 1V PORT
1 series · 1 of 1 positions shown · non-contrast
Comparison: 05/26/05.

CLINICAL DATA: Sepsis.  Consolidation. 
 PORTABLE CHEST ? 1 VIEW:

[view not recorded]
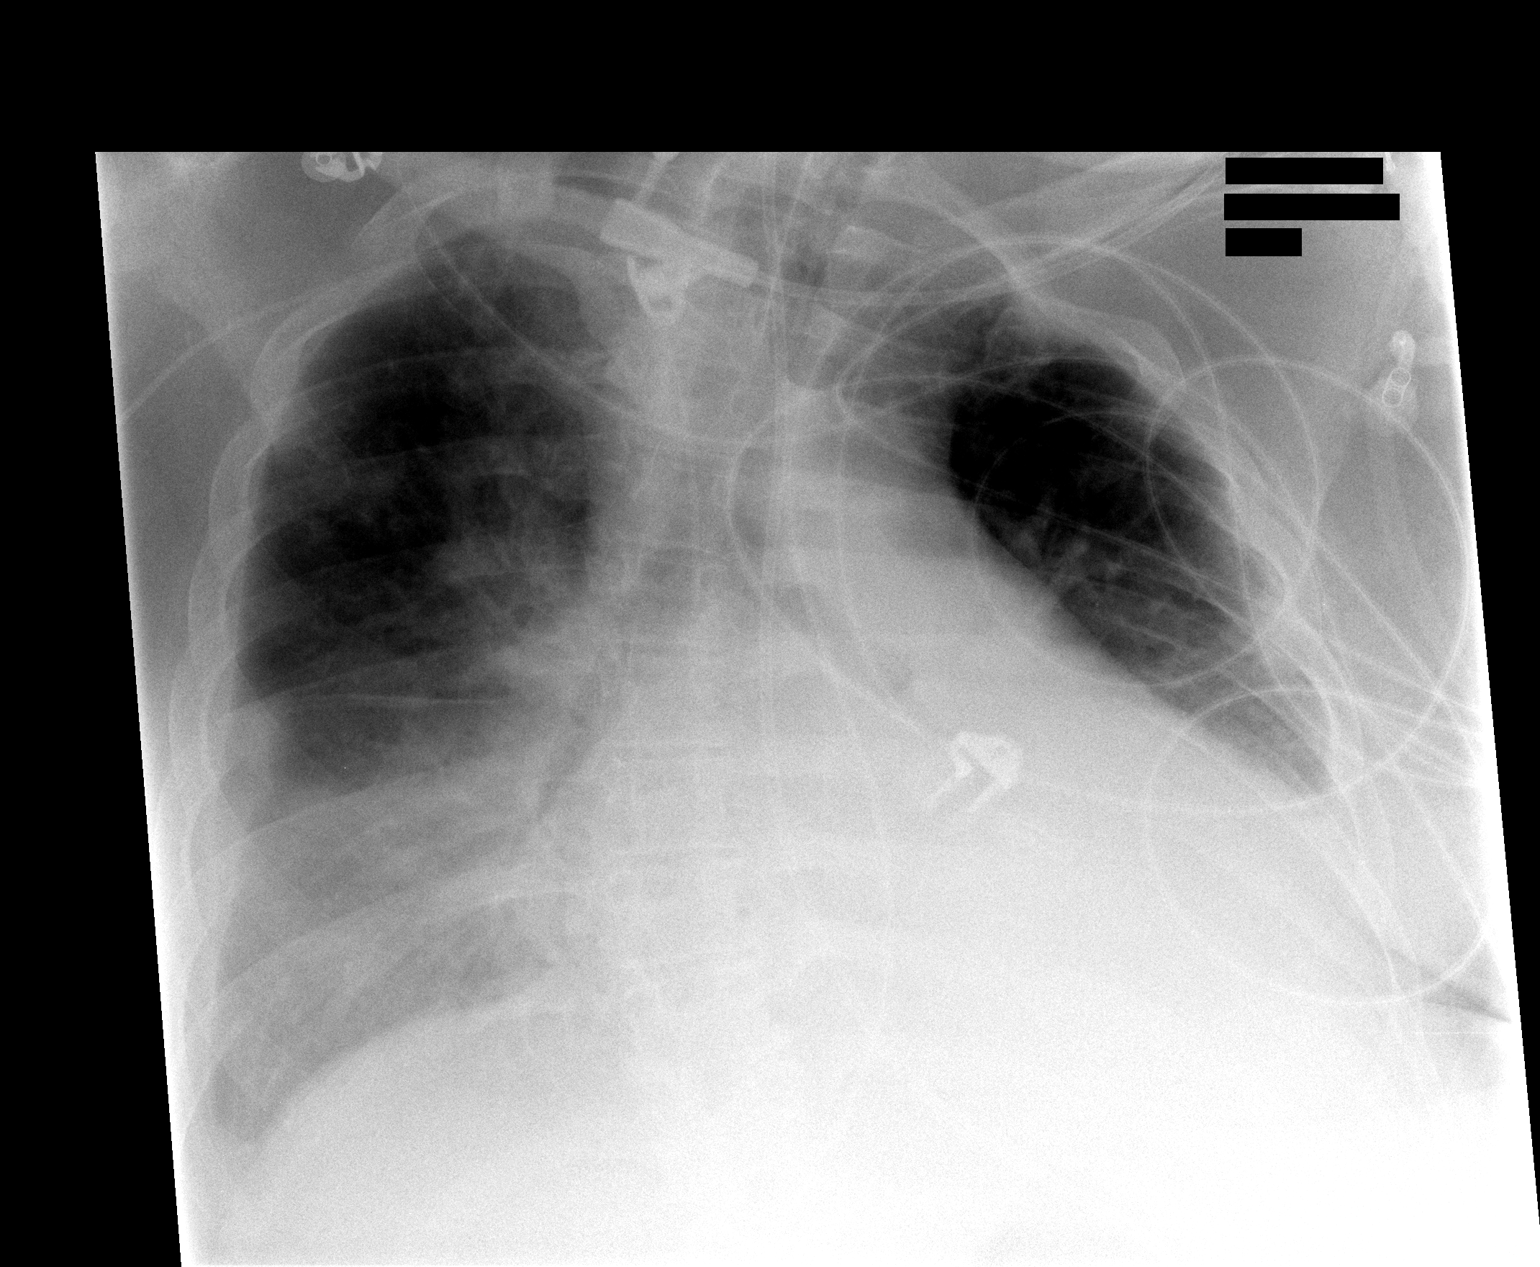

[1 of 1 positions shown; findings below may reference images not displayed]

FINDINGS: Endotracheal tube, right PICC line, and small-bore feeding tube are stable.  Scattered consolidations, bilateral pleural effusions, bibasilar atelectasis, left lower lung consolidation, and cardiomegaly are again noted.   Mild edema is present.
IMPRESSION: Stable chest.

## 2008-01-09 IMAGING — CT CT CHEST W/O CM
2 of 3 series · 15 of 36 positions shown, 18 images · IV contrast (agent unspecified)
Comparison: 05/17/2005.

CLINICAL DATA: Sepsis, respiratory distress, on ventilator. Effusions.
CHEST CT WITHOUT CONTRAST:
TECHNIQUE: Multidetector CT imaging of the chest was performed following the standard protocol without IV contrast.

[Series 2: routine chest 5.0 st · axial · 0.78mm/px · z∈[-375,-70]mm · 12 of 73 slices shown, 15 images]
[im 6/73  mediastinal]
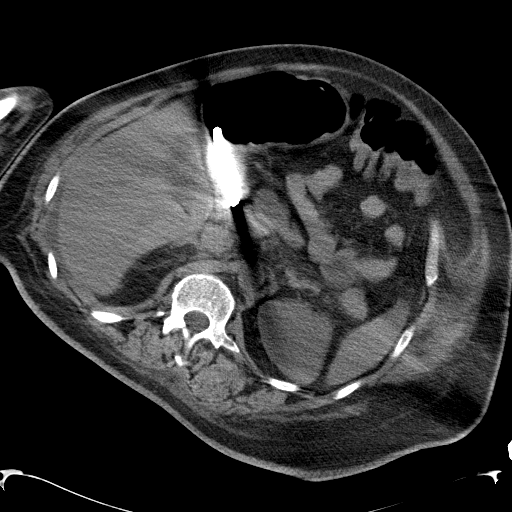
[im 6/73  lung]
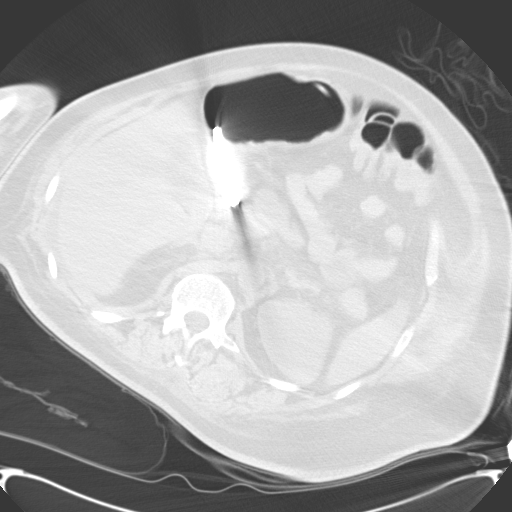
[im 11/73  lung]
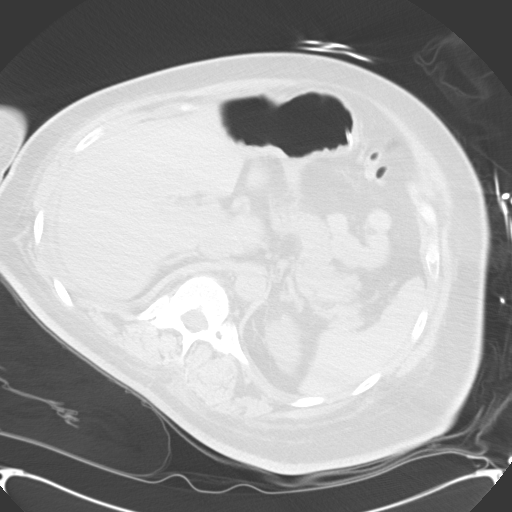
[im 17/73  lung]
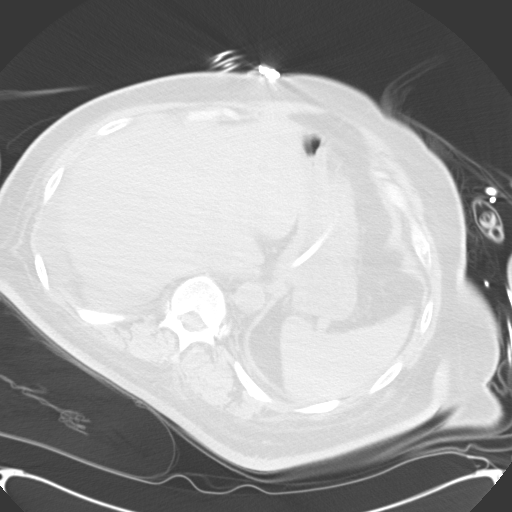
[im 22/73  lung]
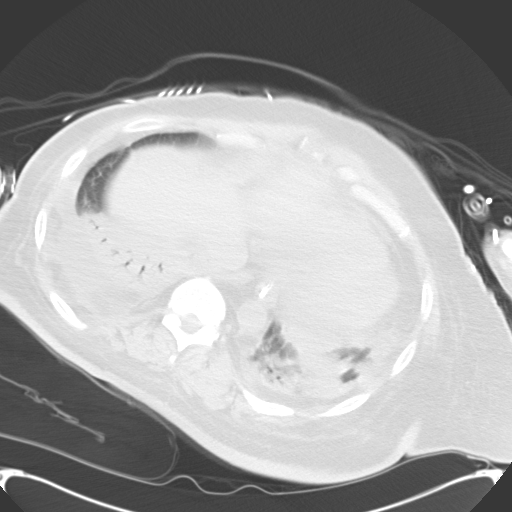
[im 27/73  mediastinal]
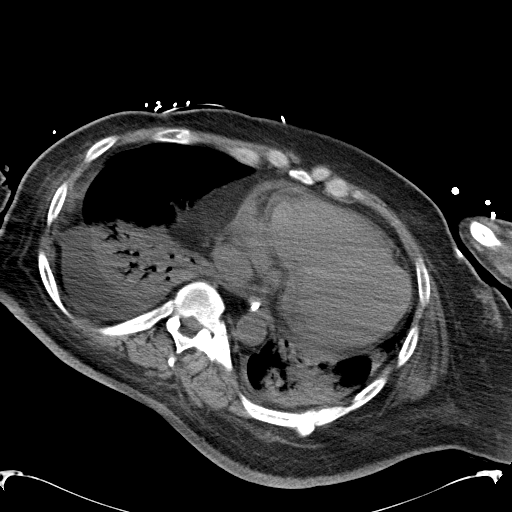
[im 27/73  lung]
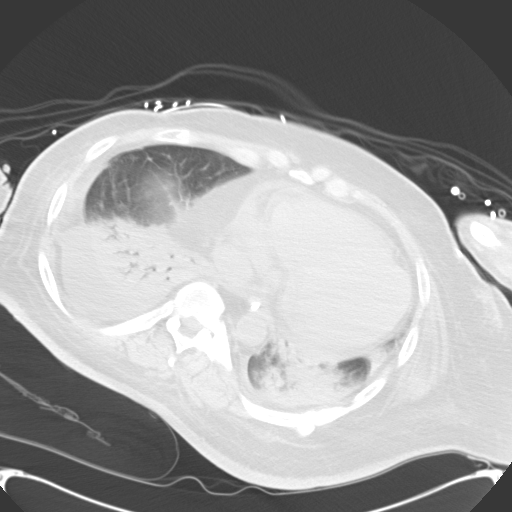
[im 33/73  lung]
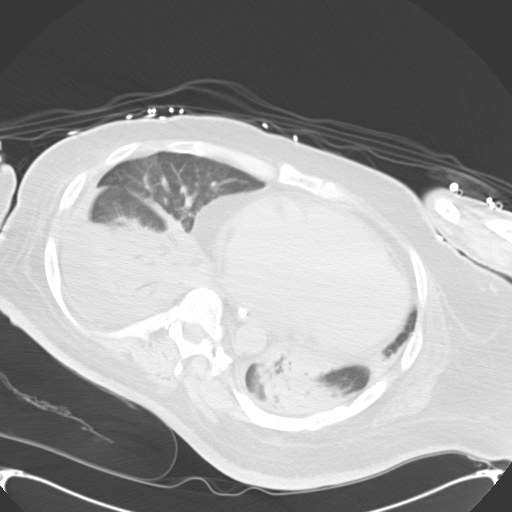
[im 41/73  lung]
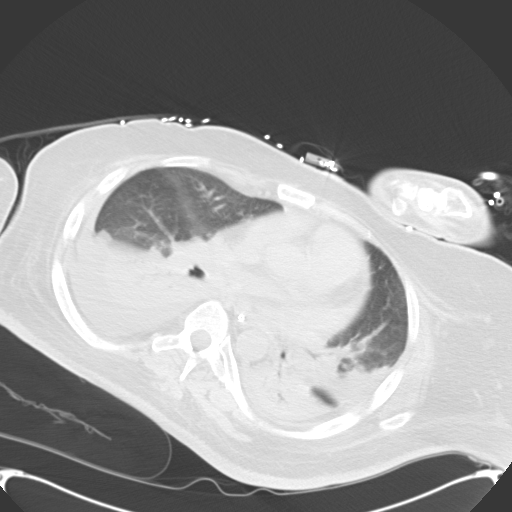
[im 46/73  lung]
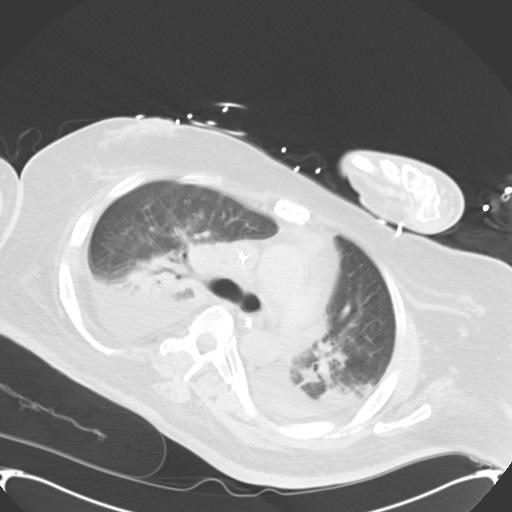
[im 51/73  mediastinal]
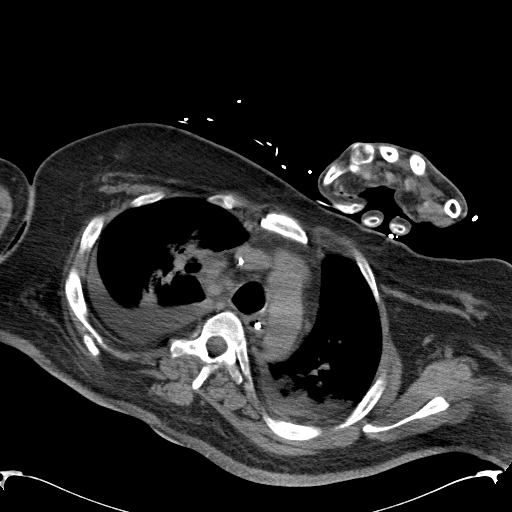
[im 51/73  lung]
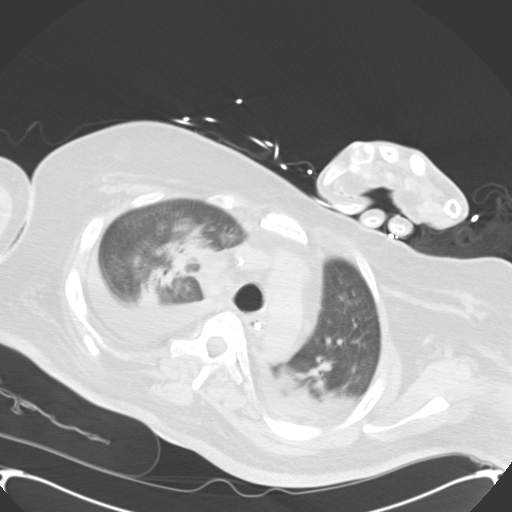
[im 57/73  lung]
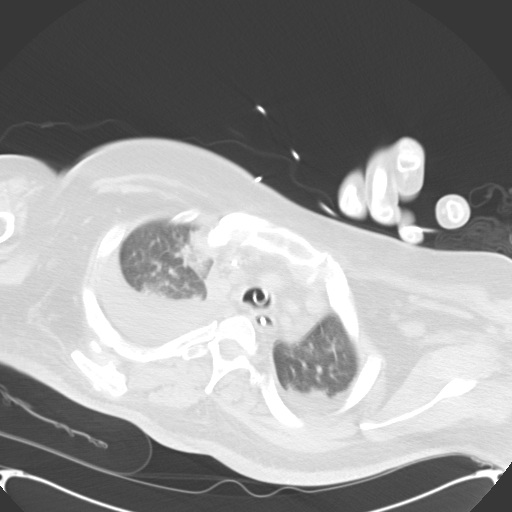
[im 62/73  lung]
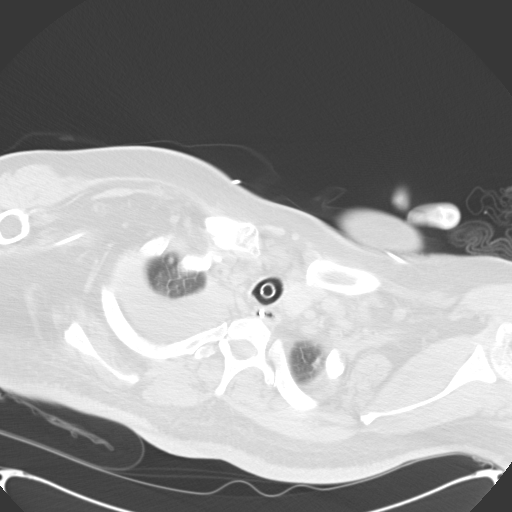
[im 67/73  lung]
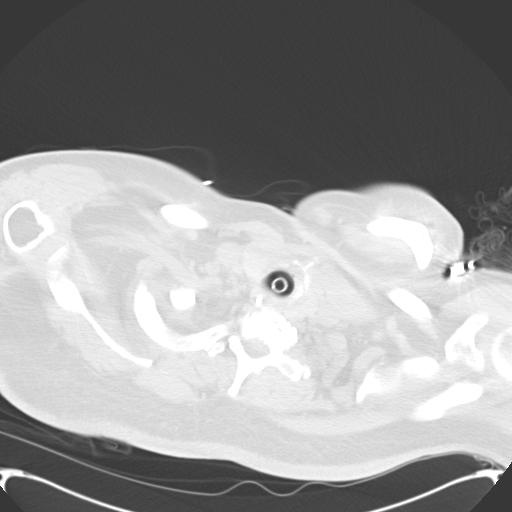

[Series 4: routine chest 2.0 st · coronal · 0.78mm/px · 3 of 105 slices shown]
[im 21/105  lung]
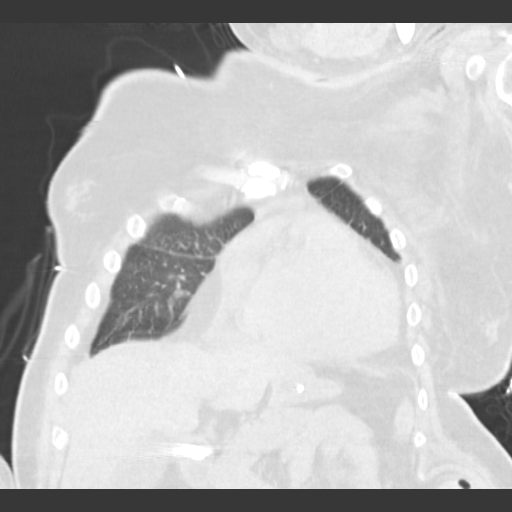
[im 42/105  lung]
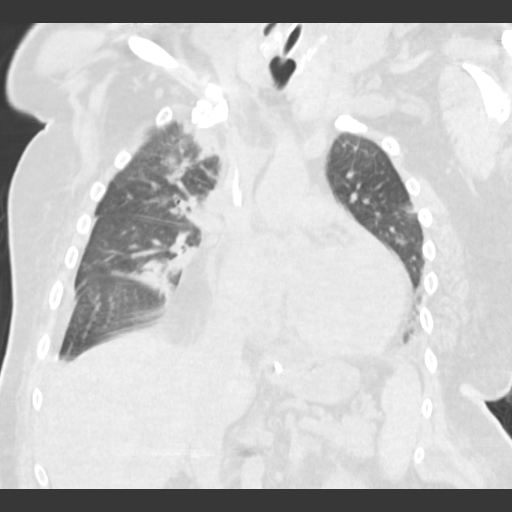
[im 63/105  lung]
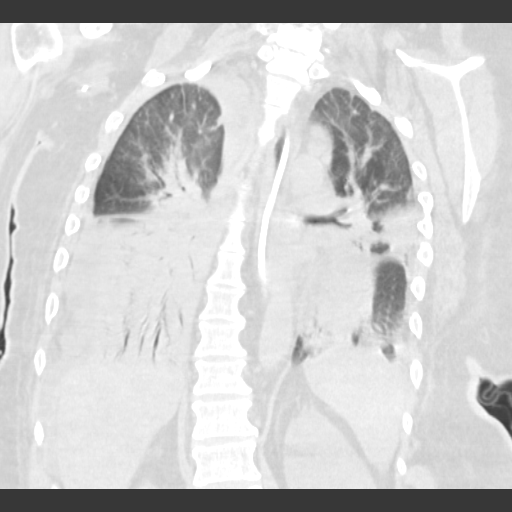

[15 of 36 positions shown; findings below may reference images not displayed]

There has been interval placement of an endotracheal tube and small-bore feeding tube.  Decreased bilateral pleural effusions are noted, moderate on the right and small on the left.  Increasing scattered consolidation within the upper and paramedian lungs noted.  ilateral lower lung atelectasis/consolidation also present.  Central ground-glass opacities likely represent edema.  Mildly enlarged mediastinal and hilar lymph nodes are unchanged. Small pericardial effusion and cardiomegaly are again noted.
IMPRESSION: 1.  Increasing upper and paramedian lung consolidation and bilateral lower lung atelectasis/consolidation.  
2.  Decreasing bilateral pleural effusions, moderate on the right and small on the left. 
3.  Cardiomegaly with probable pulmonary edema.  
4.  Stable mildly enlarged mediastinal and hilar lymph nodes probably reactive.  
5.  Endotracheal tube and feeding tube.

## 2008-01-10 IMAGING — CR DG CHEST 1V PORT
2 series · 2 of 2 positions shown · non-contrast
Comparison: 05/27/05.

CLINICAL DATA: Sepsis and respiratory failure. 
 PORTABLE CHEST - 1 VIEW, 05/28/05, 4444 HOURS:

[view not recorded (1 of 2)]
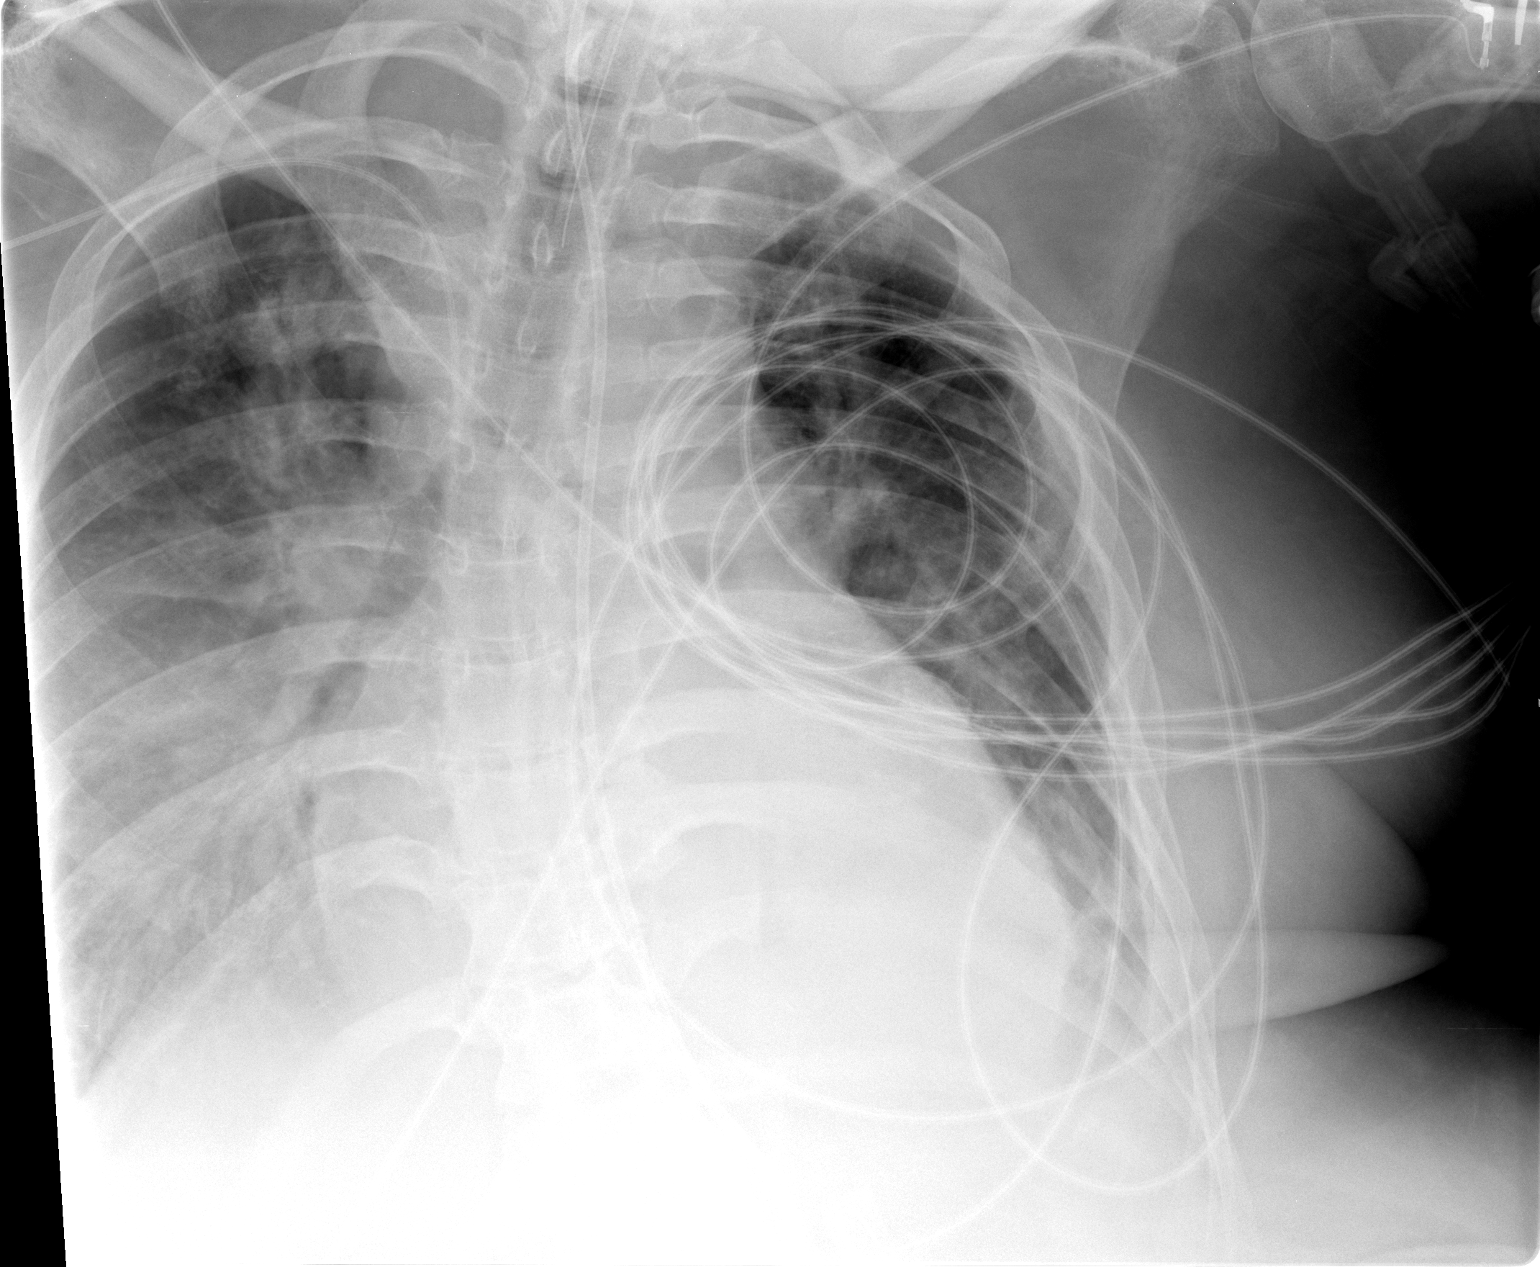

[view not recorded (2 of 2)]
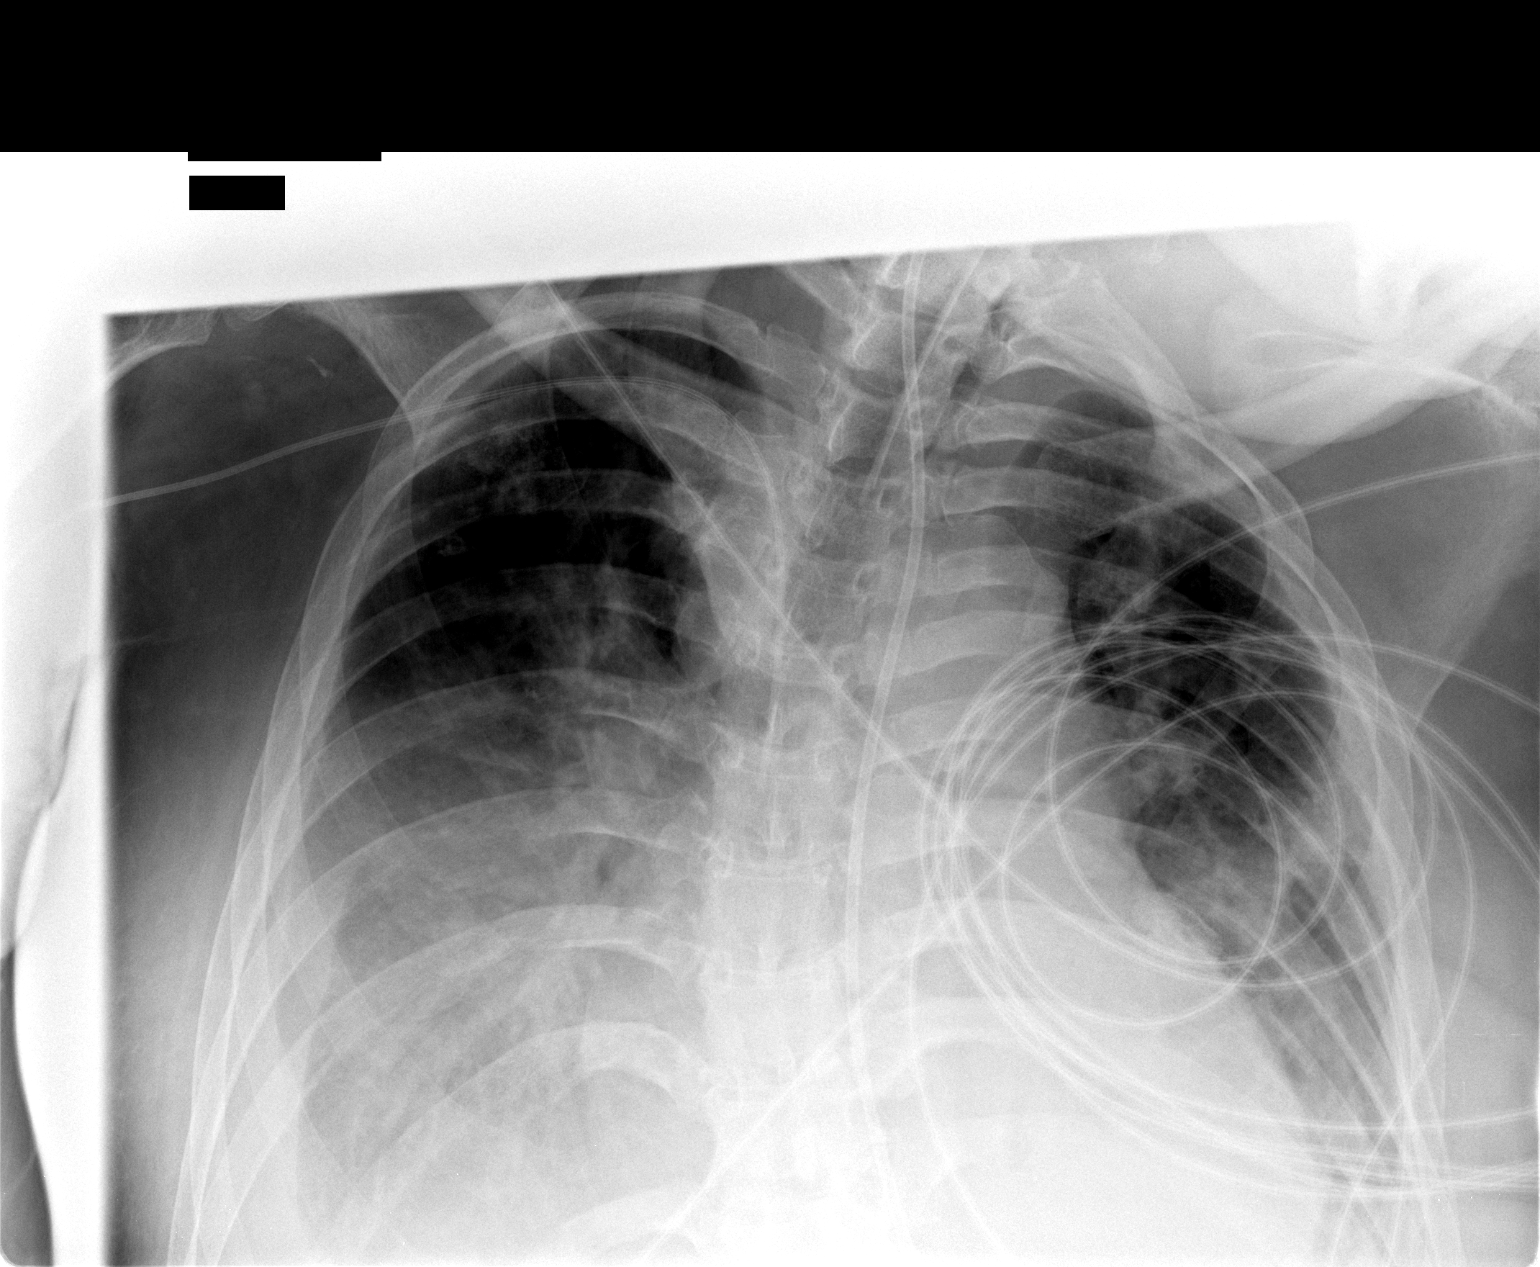

[2 of 2 positions shown; findings below may reference images not displayed]

FINDINGS: There remains air space disease involving both lower lobes.  The right pleural effusion likely has increased in size.  Stable cardiomegaly.
IMPRESSION: Probable interval increase in right pleural fluid.  Bilateral lower lobe air space disease.

## 2008-01-11 IMAGING — US US RENAL PORT
1 series · 14 of 25 positions shown · non-contrast
Comparison: none

CLINICAL DATA: Elevated creatinine.  Sepsis.  Evaluate for hydronephrosis.
 RENAL/URINARY TRACT ULTRASOUND ? 05/30/05:
TECHNIQUE: Complete ultrasound examination of the urinary tract was performed including evaluation of the kidneys, renal collecting systems, and urinary bladder.

[Series 1: renal · 0.43mm/px · 14 of 25 slices shown]
[im 1/25]
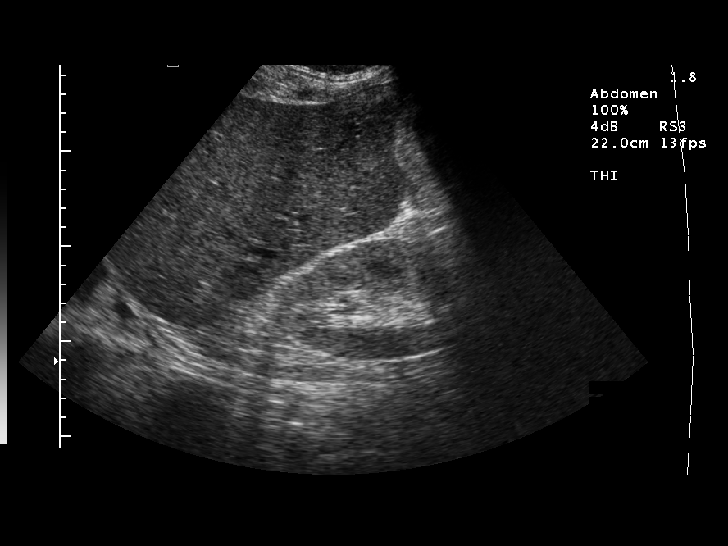
[im 3/25]
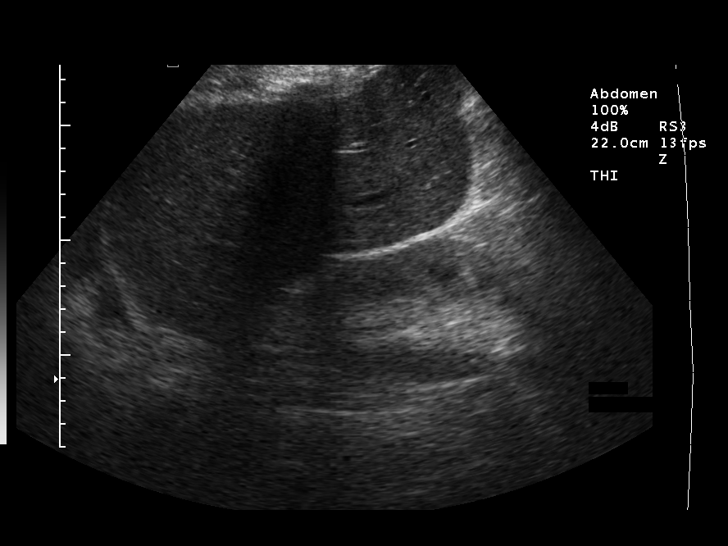
[im 5/25]
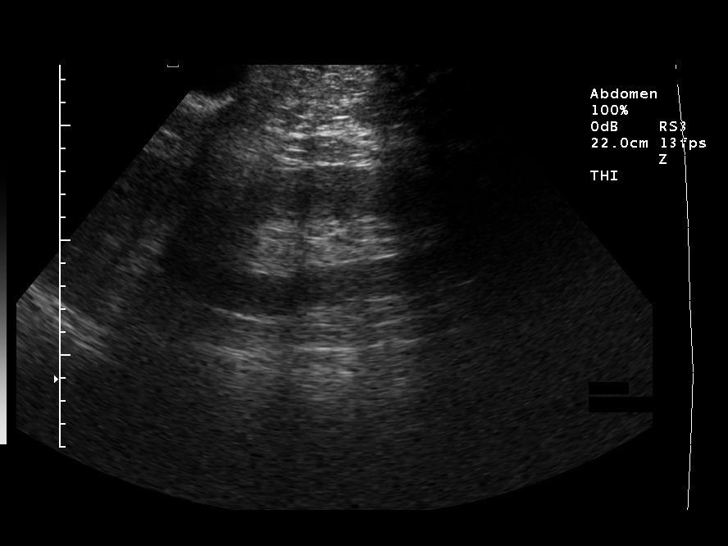
[im 7/25]
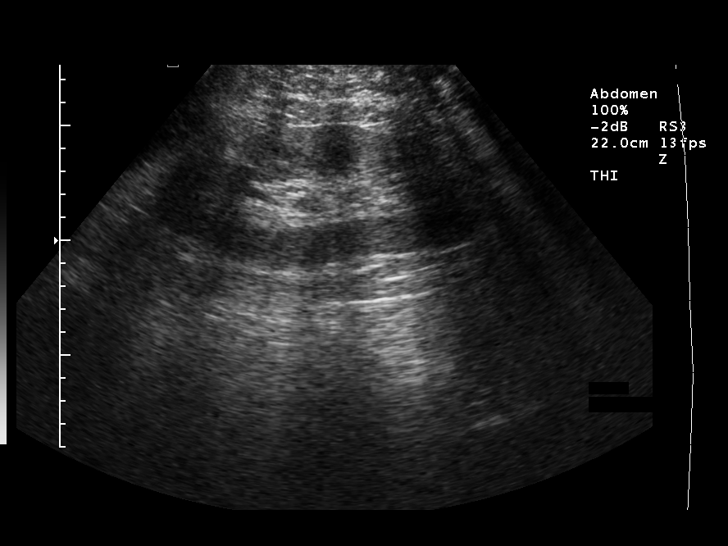
[im 9/25]
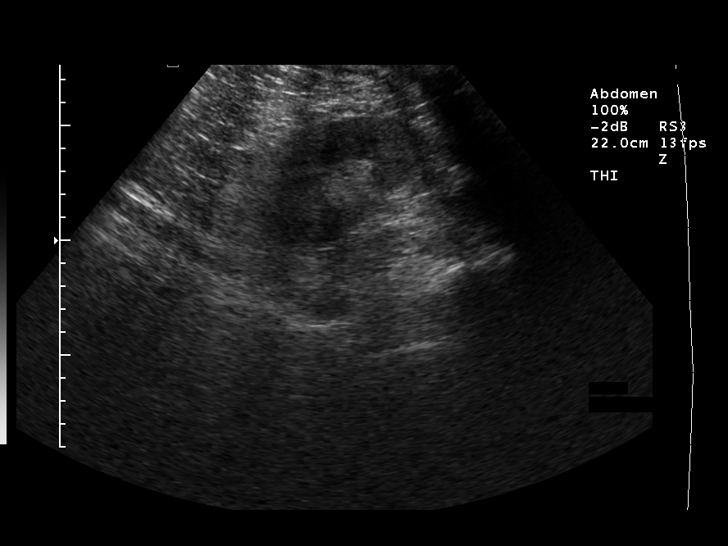
[im 10/25]
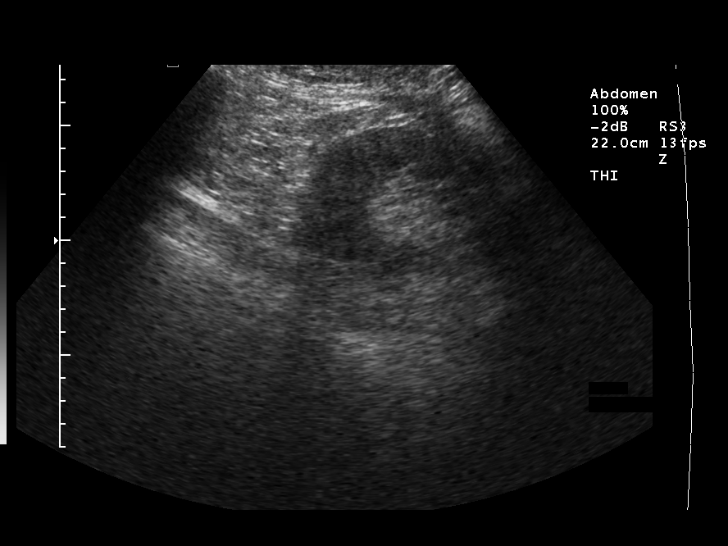
[im 12/25]
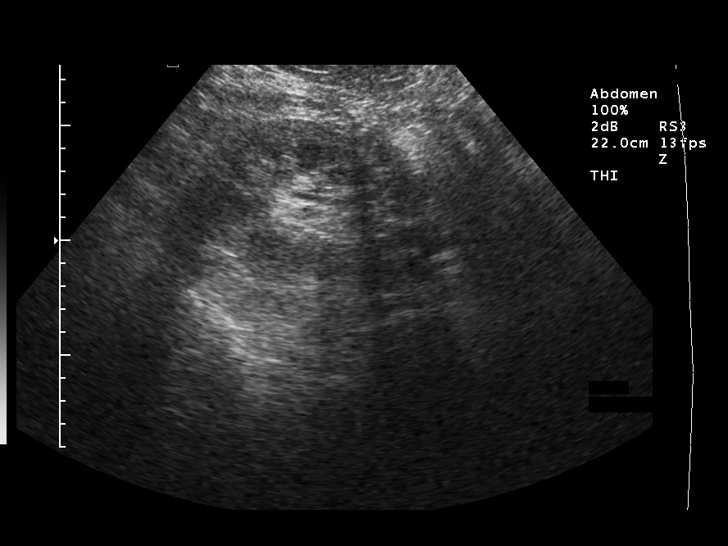
[im 14/25]
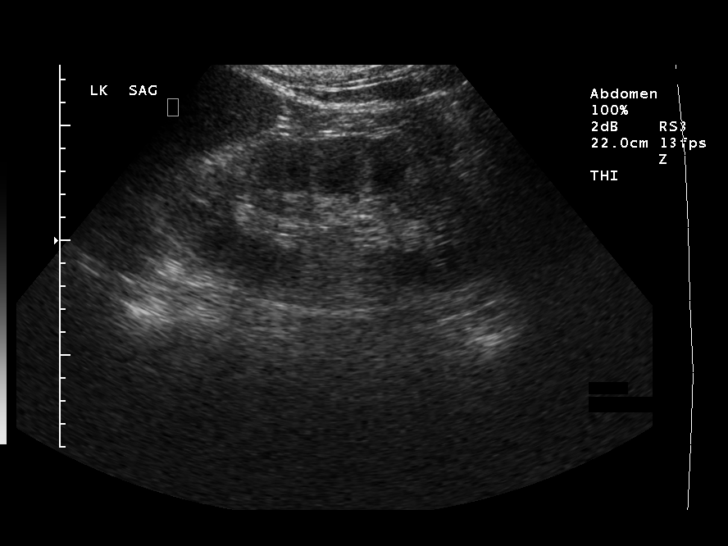
[im 16/25]
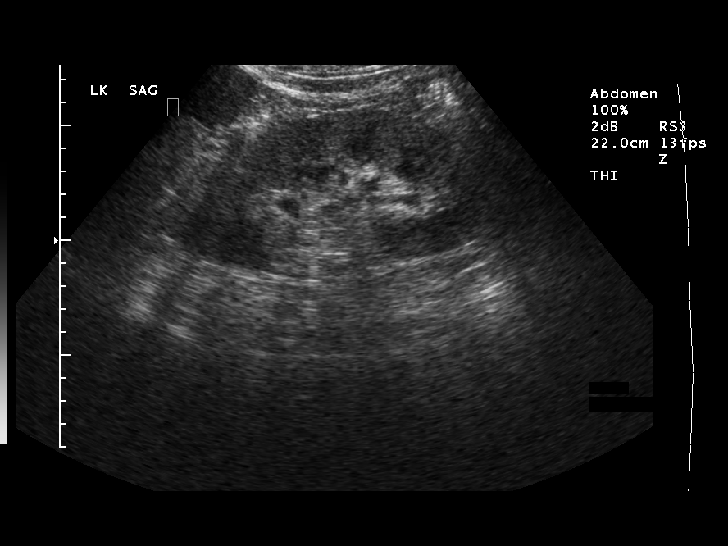
[im 17/25]
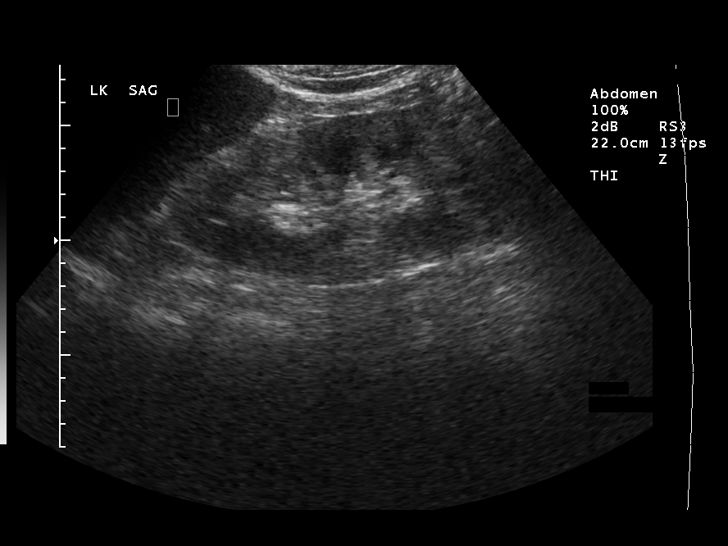
[im 19/25]
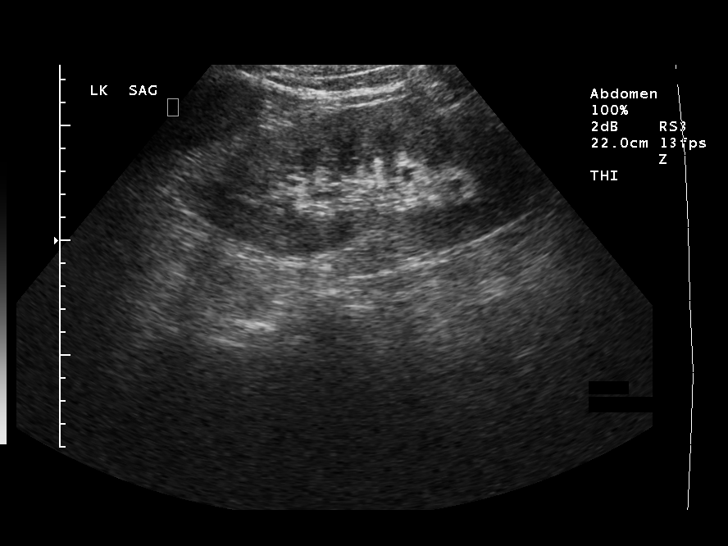
[im 21/25]
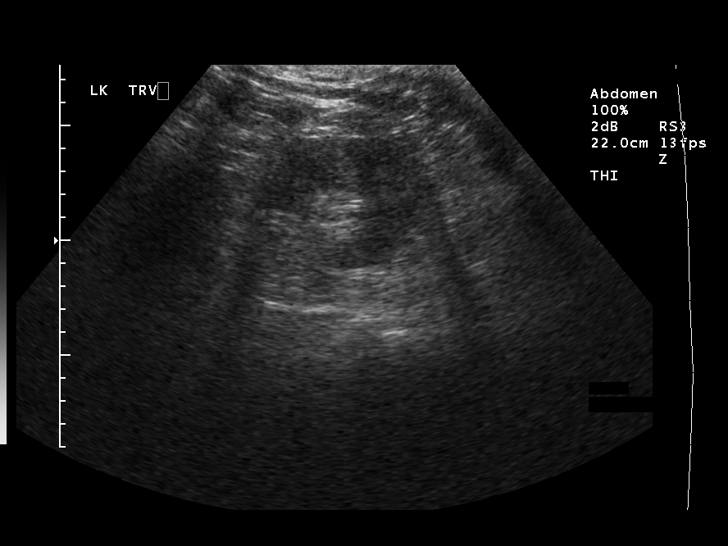
[im 23/25]
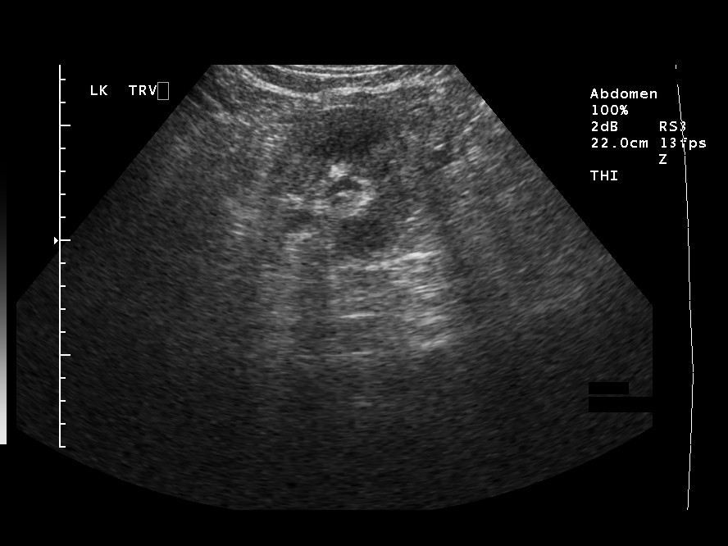
[im 25/25]
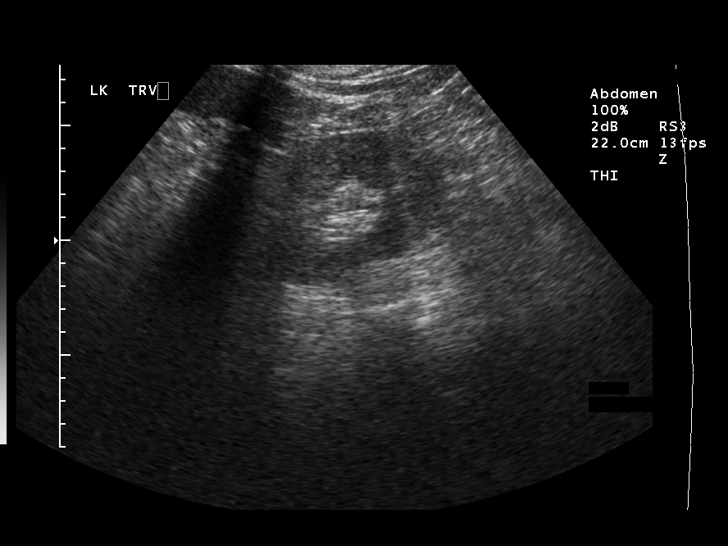

[14 of 25 positions shown; findings below may reference images not displayed]

FINDINGS: The right kidney measures 14.0 cm and the left kidney measures 14.6 cm.  There may be minimal caliectasis on the left.  No hydronephrosis bilaterally is seen.  There are no solid masses or stones.  A suprapubic catheter is in place within the bladder.
IMPRESSION: No hydronephrosis.  Minimal left renal caliectasis.

## 2008-01-12 IMAGING — CR DG CHEST 1V PORT
1 series · 1 of 1 positions shown · non-contrast
Comparison: 05/28/2005

CLINICAL DATA: Sepsis

PORTABLE CHEST - 1 VIEW:

[view not recorded]
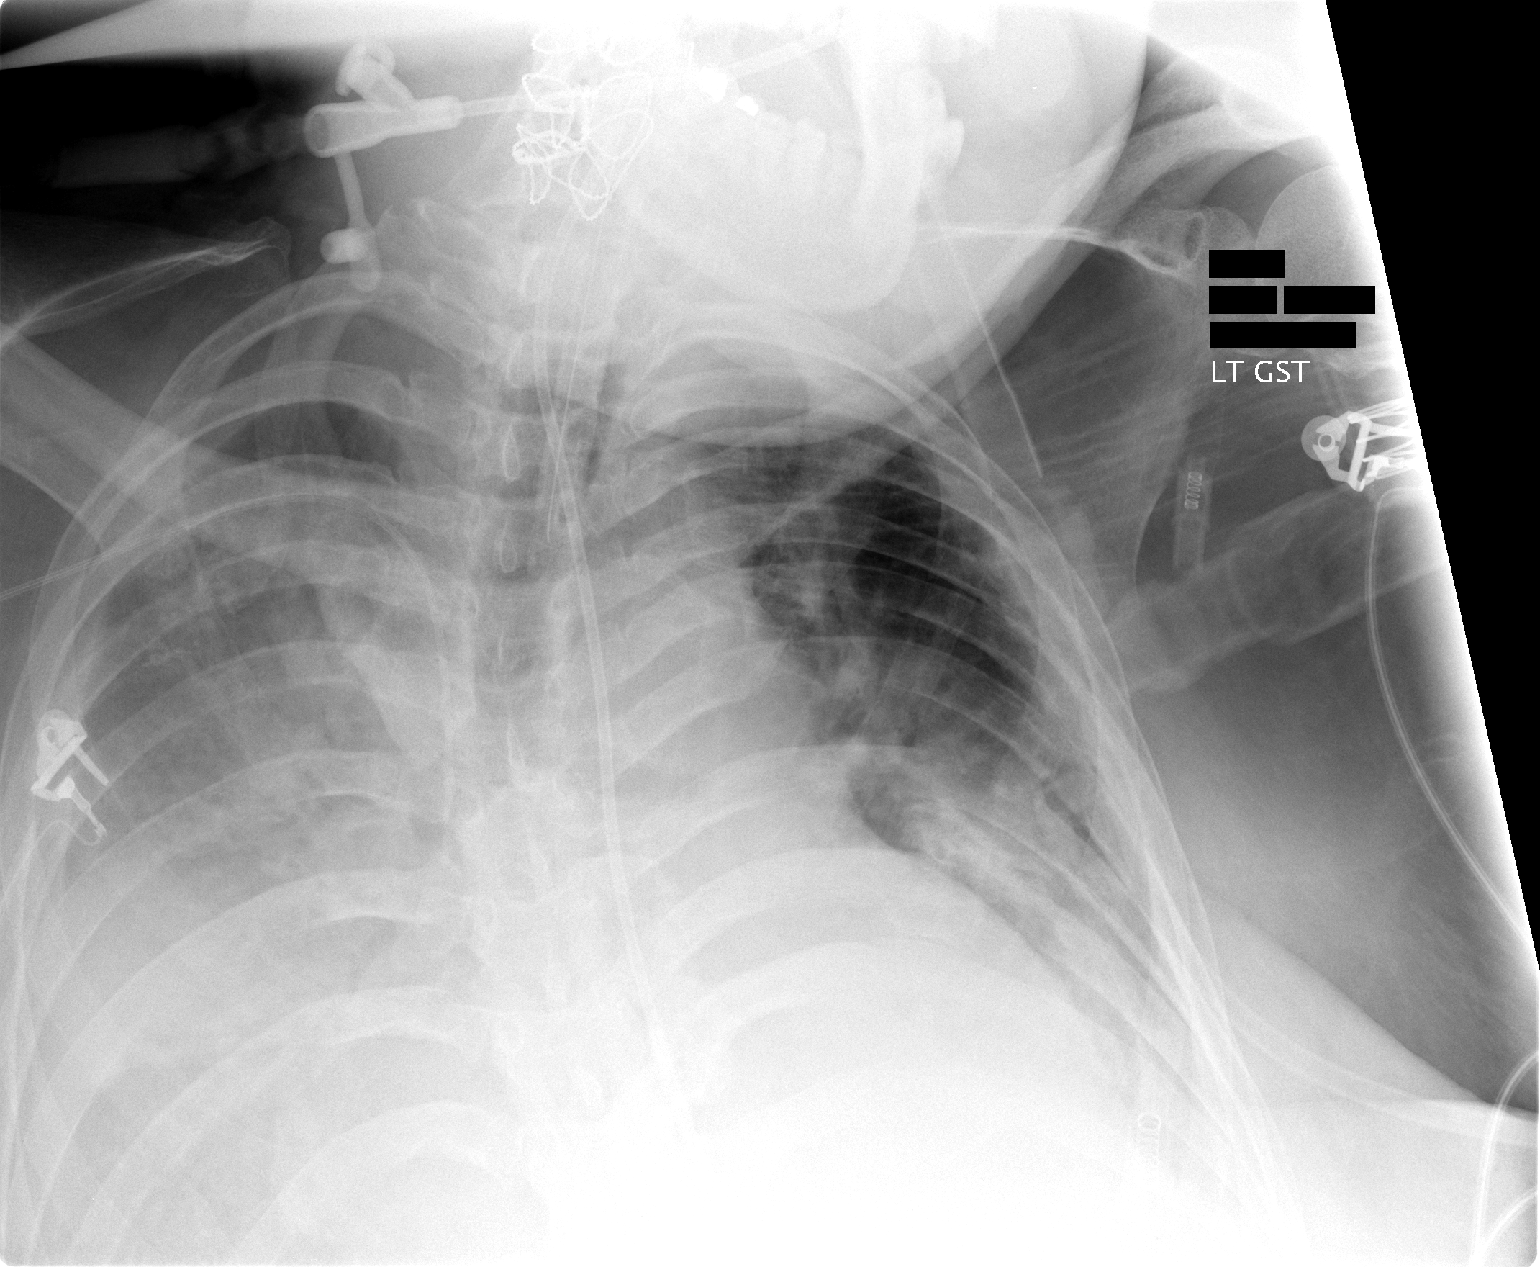

[1 of 1 positions shown; findings below may reference images not displayed]

FINDINGS: Continued bilateral airspace opacities are noted. Bilateral
effusions, right greater than left. Likely no significant change since prior
study.
IMPRESSION: Continued bilateral airspace disease and bilateral effusions, both right worse
than left, essentially unchanged.

## 2008-01-13 IMAGING — CR DG CHEST 1V PORT
1 series · 1 of 1 positions shown · non-contrast
Comparison: 05/30/05.

CLINICAL DATA: Sepsis.
 CHEST  PORTABLE - 1 VIEW -   6766 HOURS:

[view not recorded]
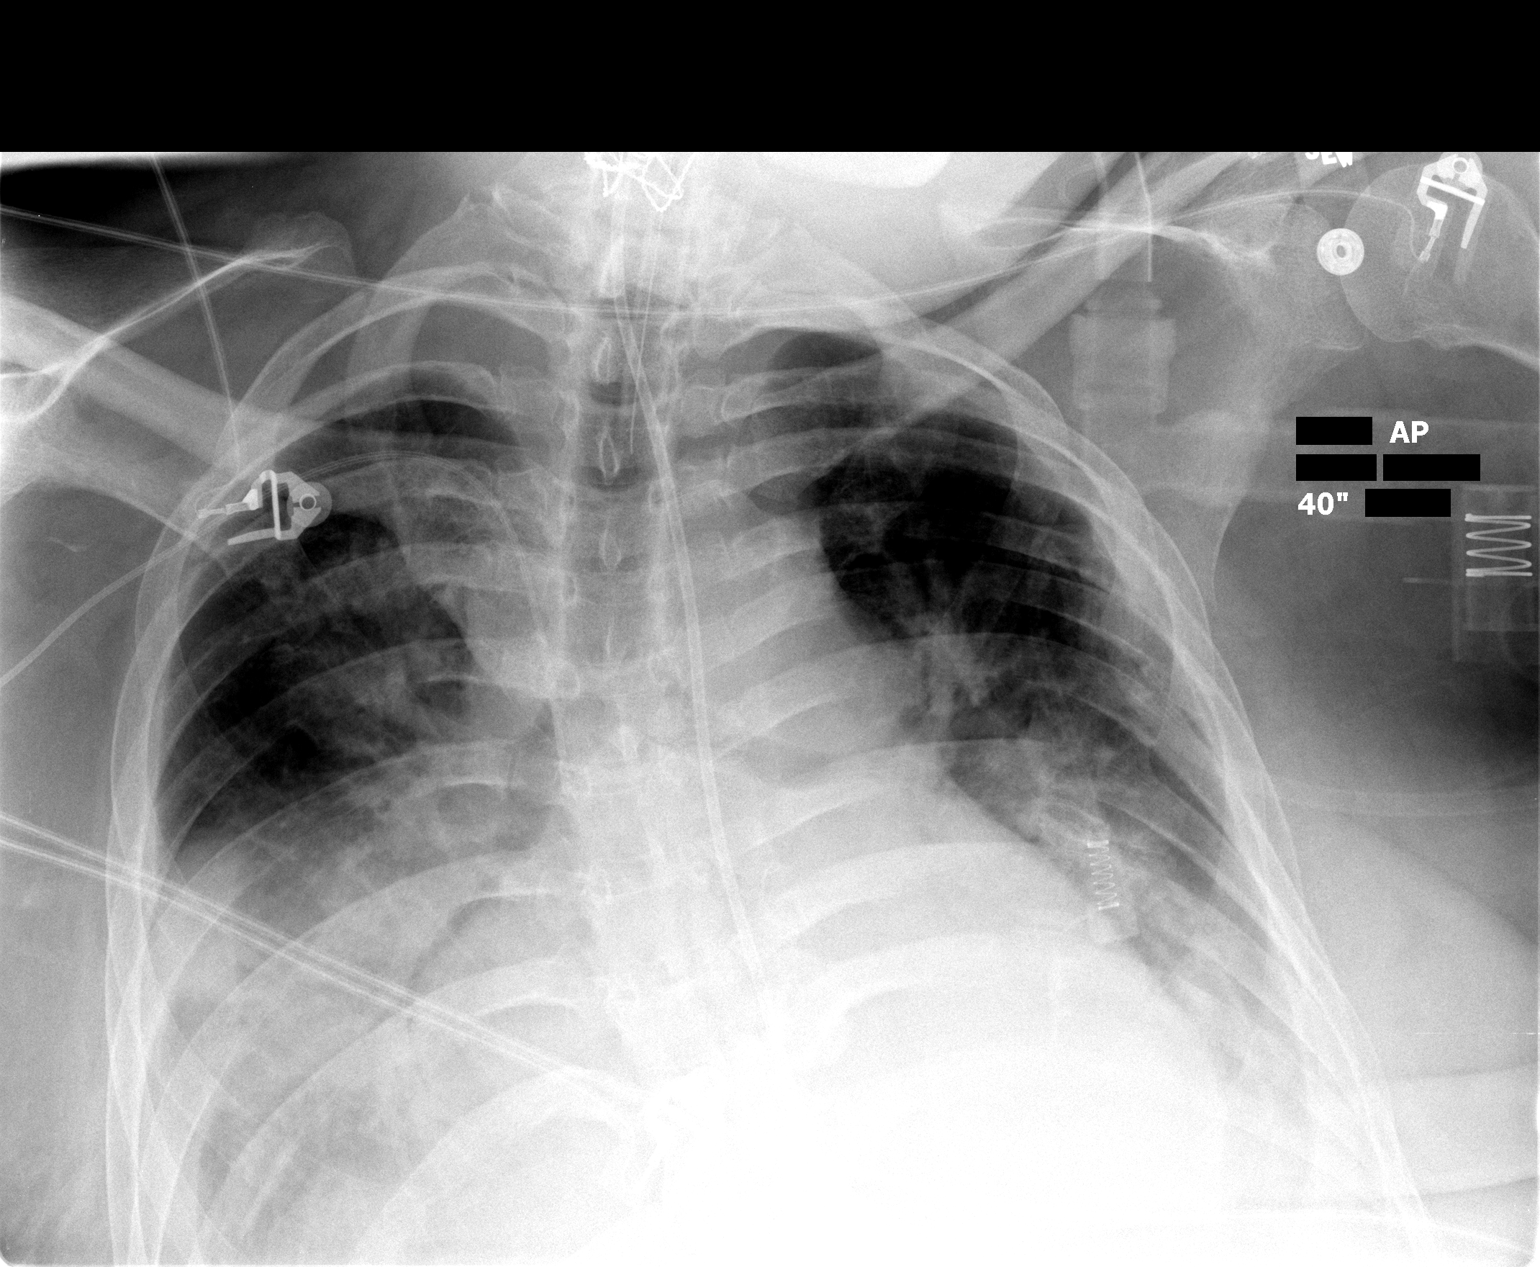

[1 of 1 positions shown; findings below may reference images not displayed]

FINDINGS: The endotracheal tube is in good position.  A PICC line is in the SVC.  There has been no change in bilateral pulmonary edema/infiltrate.  There is no change in bilateral atelectasis and effusion.  The pattern is most compatible with congestive heart failure although pneumonia cannot be excluded.
IMPRESSION: No significant change in bilateral air space disease and bilateral effusions.

## 2008-01-14 IMAGING — CR DG CHEST 1V PORT
1 series · 1 of 1 positions shown · non-contrast
Comparison: Multiple previous studies, most recent is from 05/31/05.

CLINICAL DATA: 39-year-old, with sepsis. 
 PORTABLE CHEST:

[view not recorded]
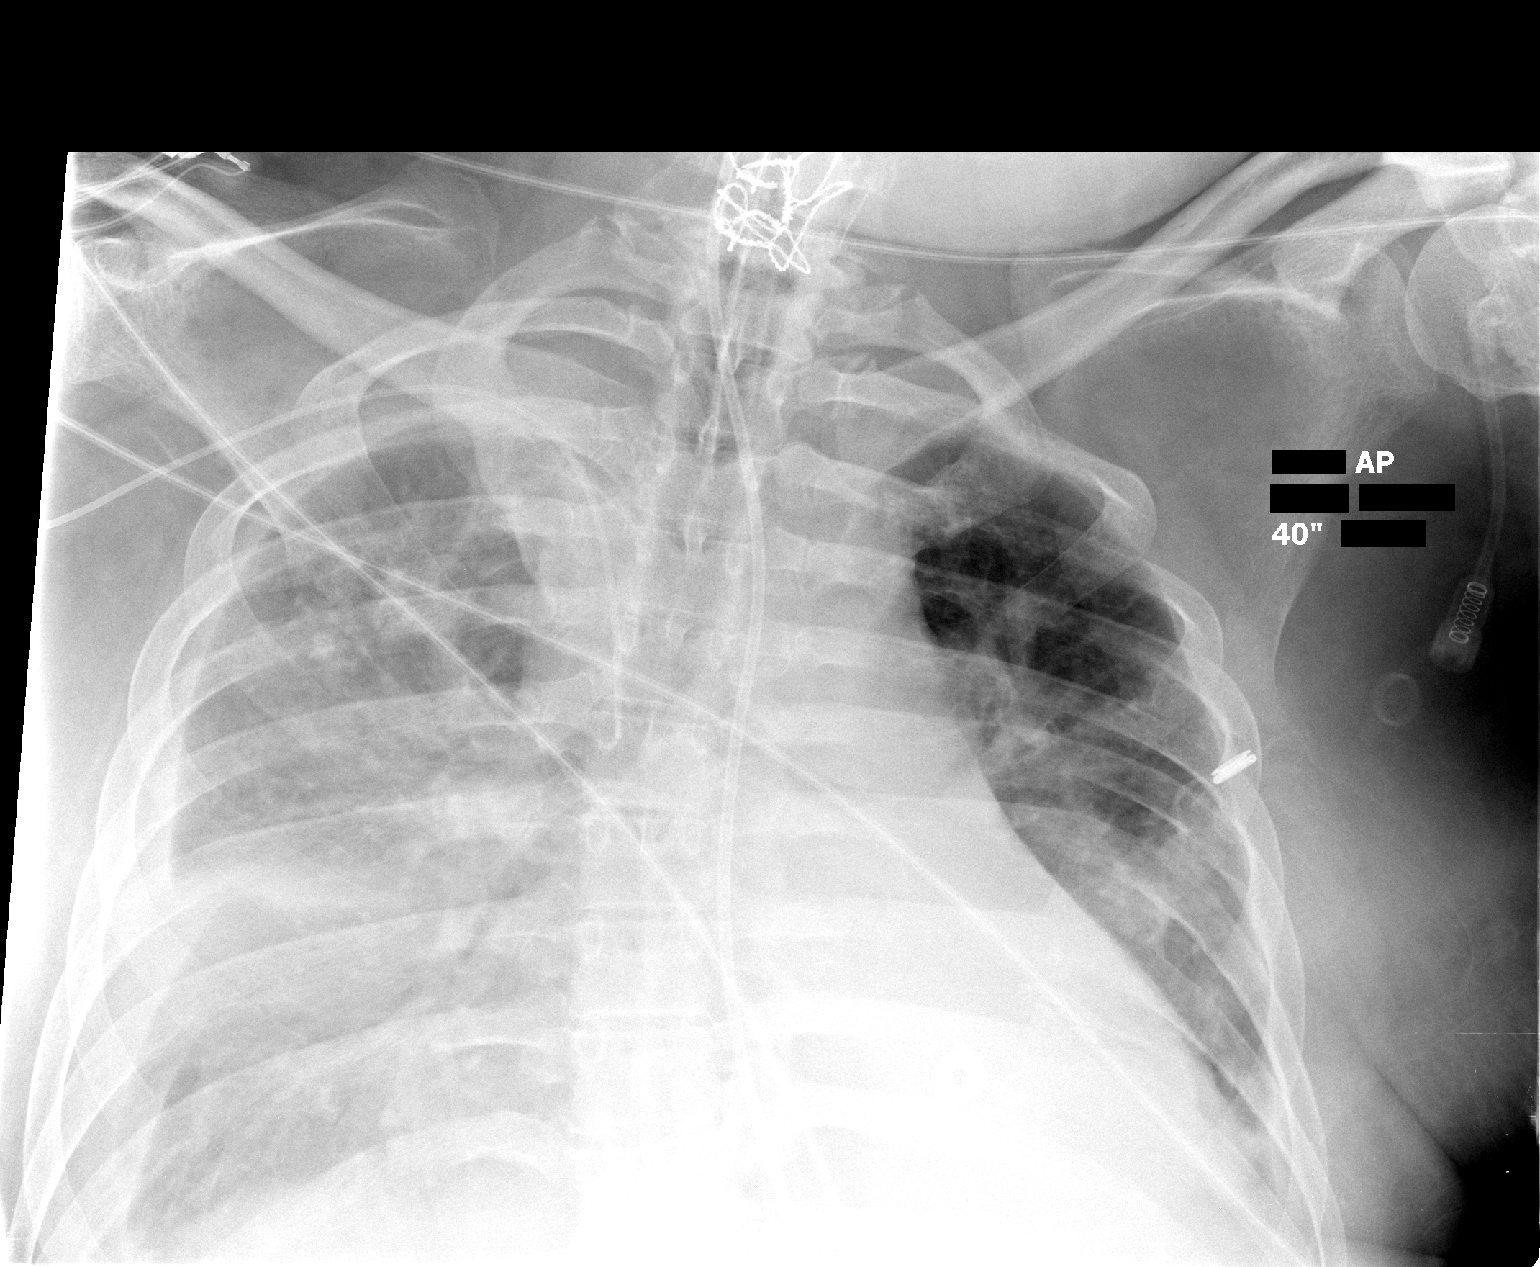

[1 of 1 positions shown; findings below may reference images not displayed]

FINDINGS: Right PICC line is stable.  The endotracheal tube and Panda tubes are unchanged.  Persistent bilateral airspace opacities perhaps slightly progressive on the right side particularly in the right upper lobe.  There also appears to be an enlarging right effusion.
IMPRESSION: 1.  Stable support apparatus.
 2.  Slight worsening aeration and slight increase in right-sided effusion.

## 2008-01-14 IMAGING — CR DG CHEST 1V PORT
1 series · 1 of 1 positions shown · non-contrast
Comparison: none

HISTORY: Diatek catheter insertion, sepsis

[view not recorded]
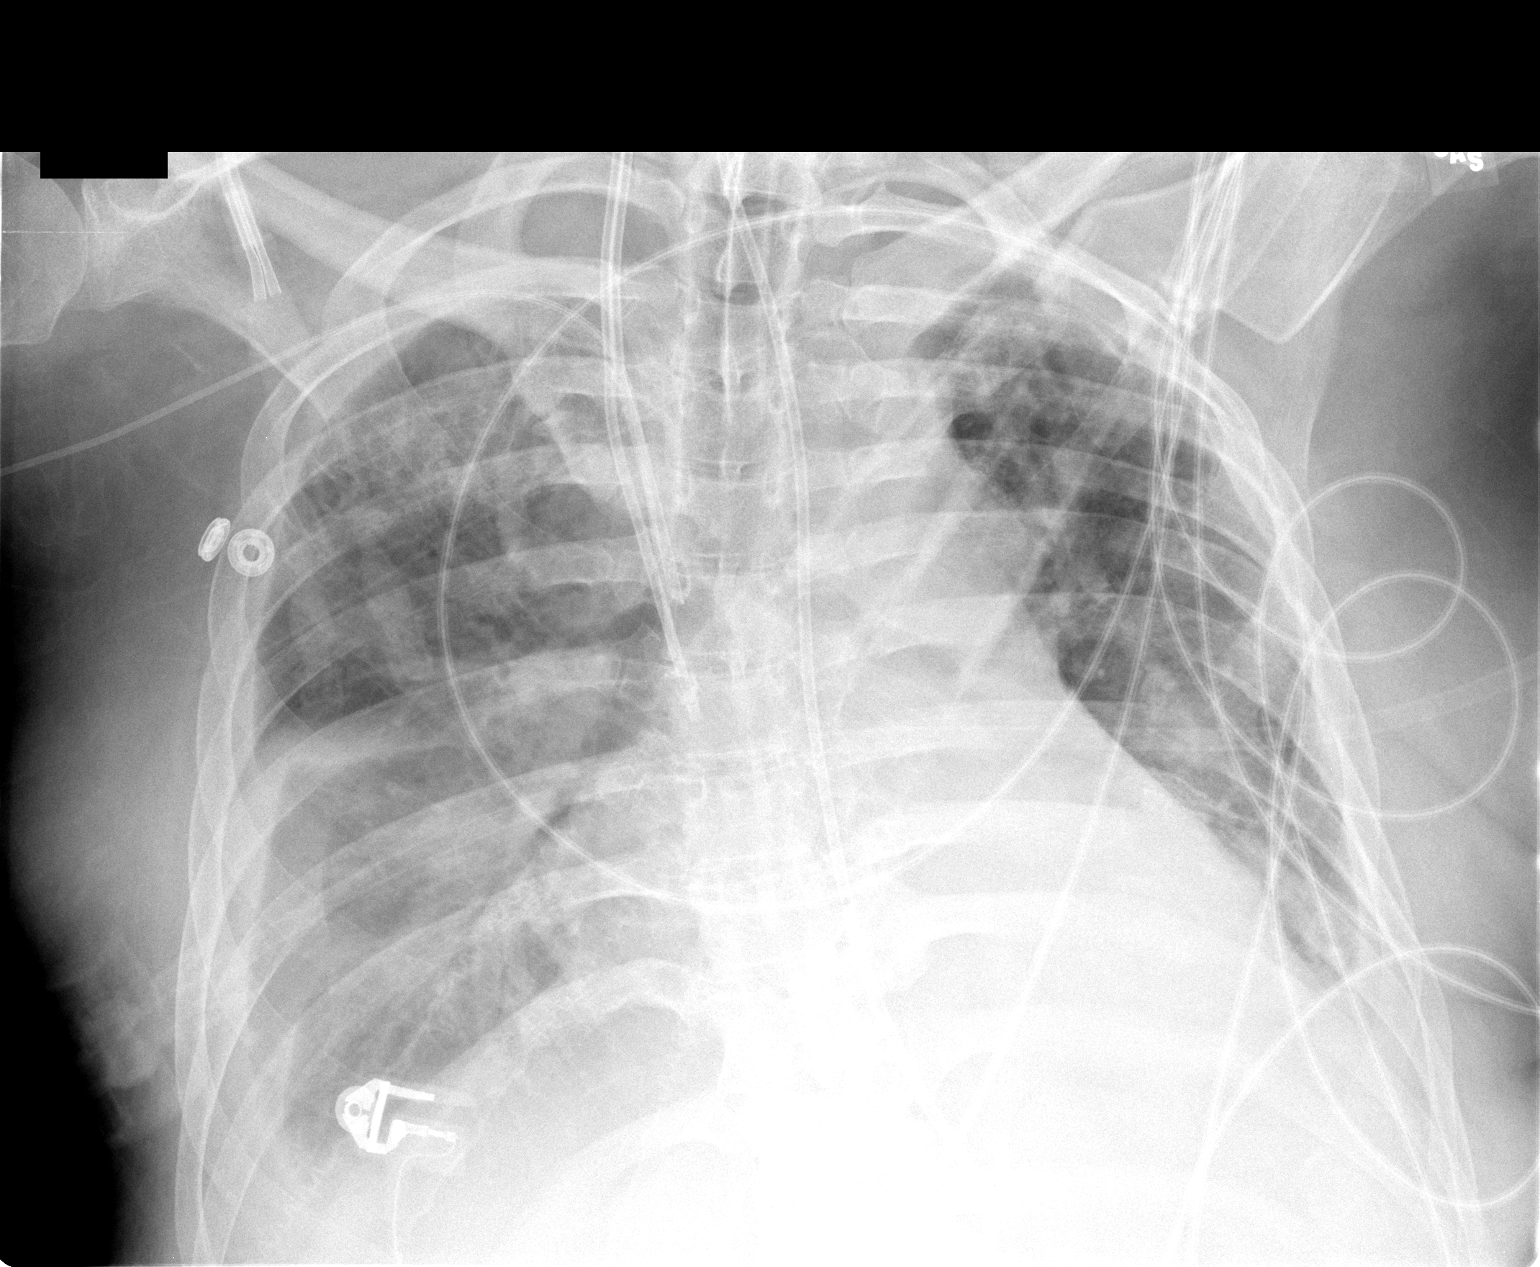

[1 of 1 positions shown; findings below may reference images not displayed]

PORTABLE CHEST ONE VIEW:

Portable exam 3037 hours compared to 8788 hours.

Feeding tube, endotracheal tube, and right arm PICC line stable.
New right jugular central venous catheter, tip SVC.
No pneumothorax.
Persistent right pleural effusion.
Scattered infiltrates or atelectasis right lung with persistent atelectasis or
dense consolidation left lower lobe.
Numerous cardiac monitoring lines project over chest.
Multiple wires in cervical spine from prior surgery.
IMPRESSION: No pneumothorax following central line insertion.
Otherwise stable exam as above.

## 2008-01-16 IMAGING — CR DG CHEST 1V PORT
1 series · 1 of 1 positions shown · non-contrast
Comparison: 06/01/05.

CLINICAL DATA: Sepsis.  Followup. 
 PORTABLE CHEST ? 1 VIEW:

[view not recorded]
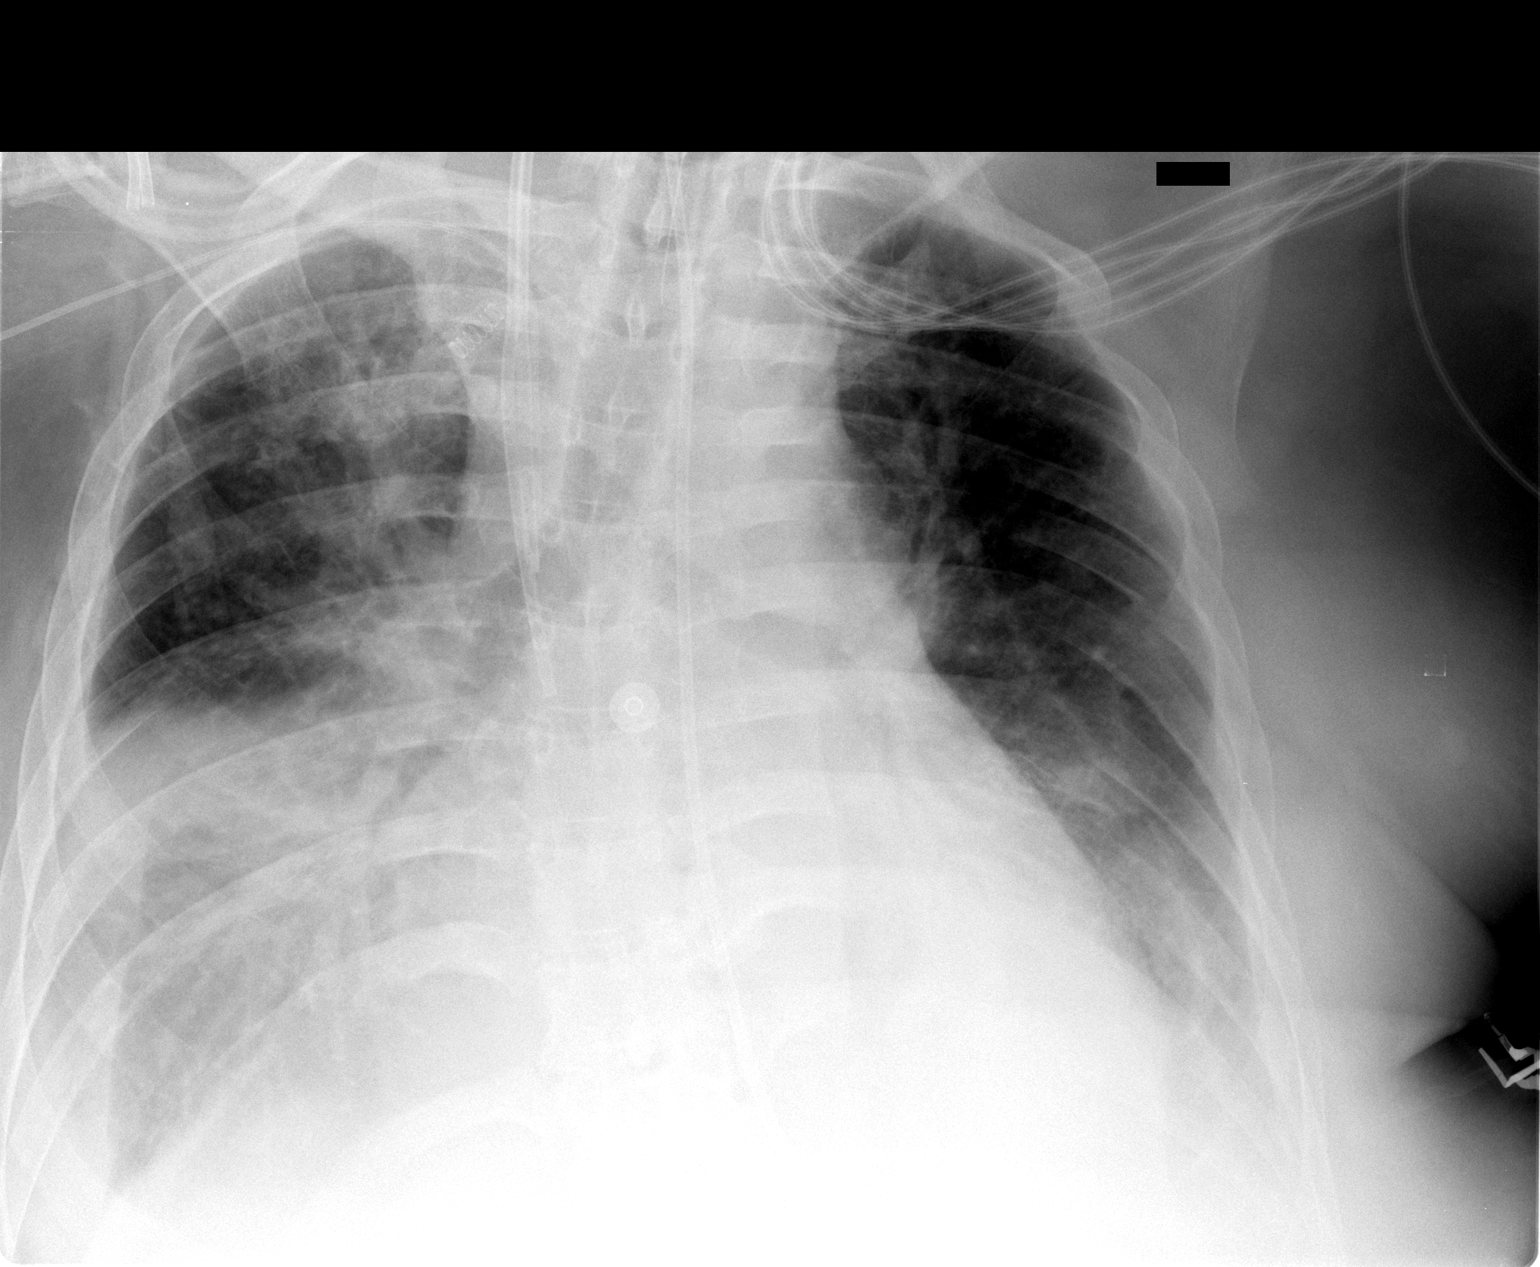

[1 of 1 positions shown; findings below may reference images not displayed]

FINDINGS: ET tube tip is 6.9 cm above the carina.  
 There is a right-sided dialysis catheter with tip in the projection of the SVC.  Weighted feeding tube is below the field of view.
 Increasing right effusion. 
 There is scattered infiltrates or atelectasis within the right lung and persistent atelectasis versus consolidation in the left lower lobe.
IMPRESSION: Worsening right effusion and right lung aeration.

## 2008-01-17 IMAGING — CR DG CHEST 1V PORT
1 series · 1 of 1 positions shown · non-contrast
Comparison: 06/03/05, 06/01/05, 05/17/05.

CLINICAL DATA: Sepsis.  Status post tracheostomy placement.  
 AP PORTABLE CHEST - 1 VIEW 06/04/05 AT 0100 HOURS:

[view not recorded]
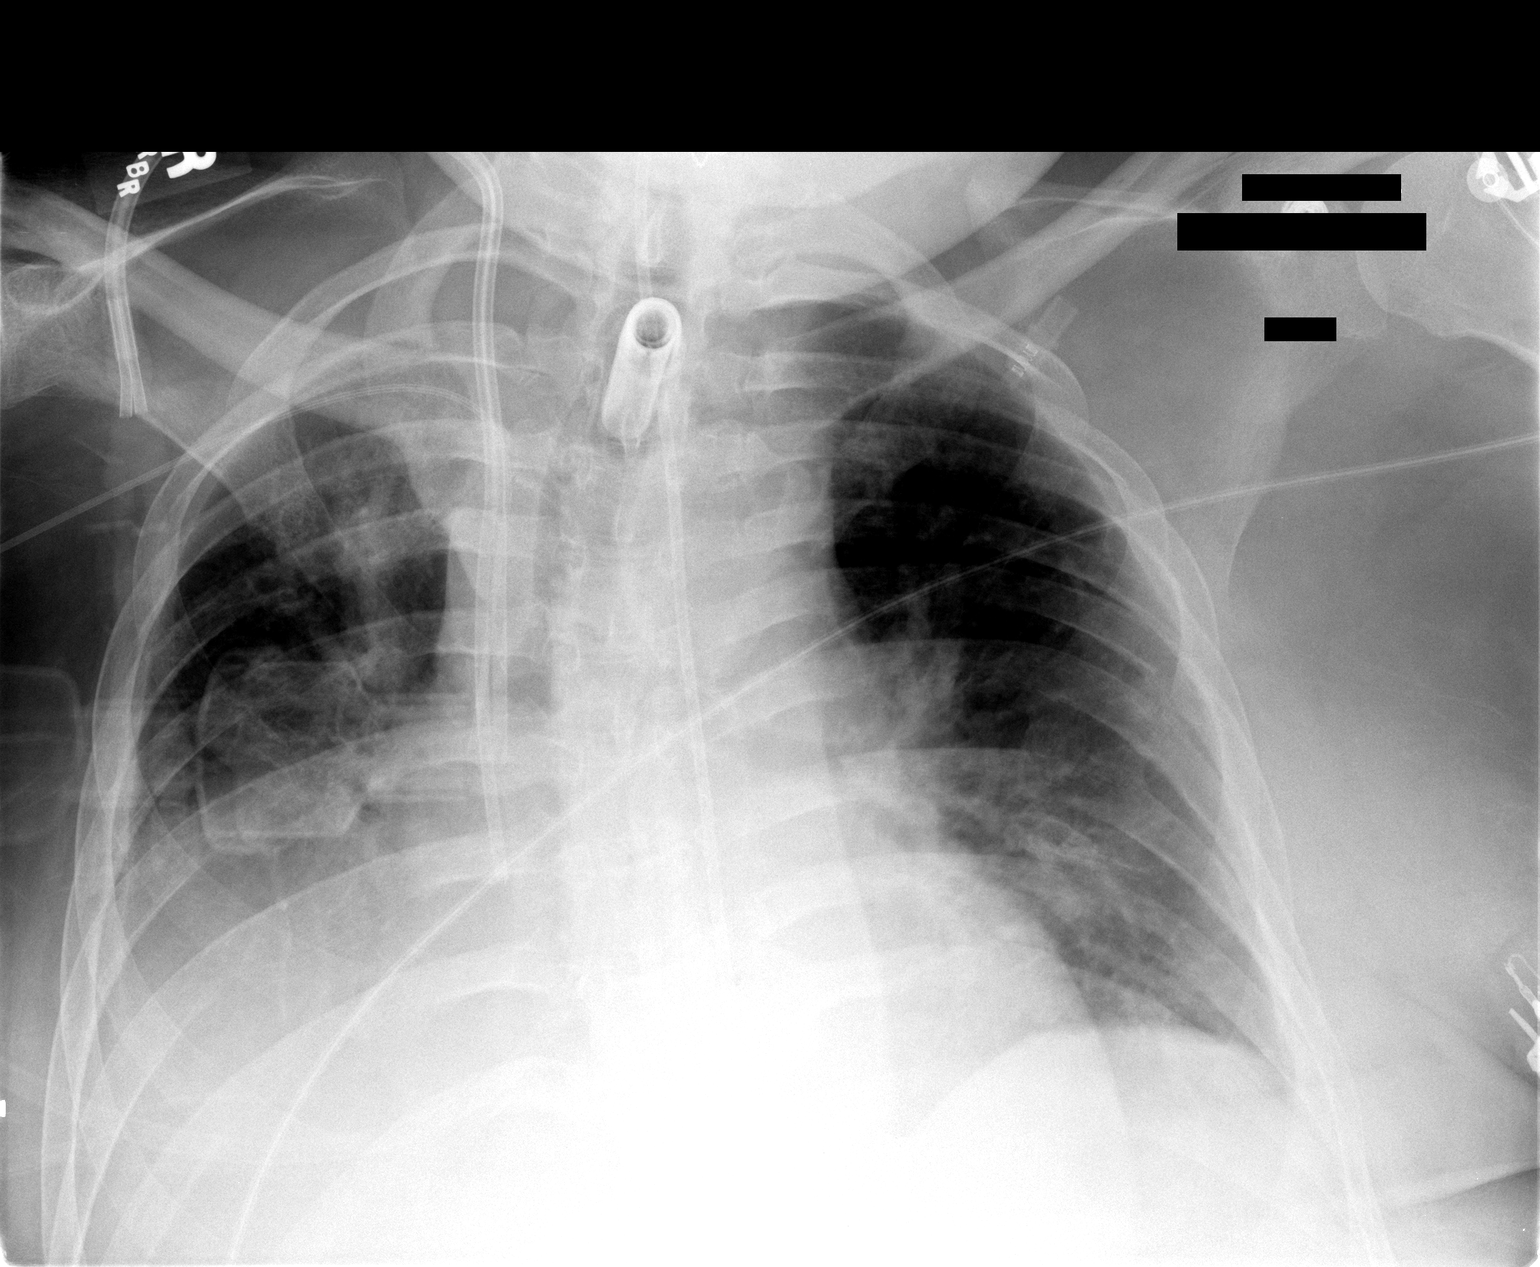

[1 of 1 positions shown; findings below may reference images not displayed]

FINDINGS: Endotracheal tube has been removed in the interval with a new tracheostomy tube in place.  Tip of the tracheostomy tube projects over the trachea proximally 6.8 cm above the base of the carina.  Right sided dialysis catheter remains in place.  There is persistent right pleural effusion with right atelectasis or infiltrate.  Soft tissue fullness in the right paratracheal tissues is stable.  Right PICC line remains in place.
IMPRESSION: Interval tracheostomy tube placement.  No substantial interval change in exam.

## 2008-01-18 IMAGING — CR DG CHEST 1V PORT
2 series · 2 of 2 positions shown · non-contrast
Comparison: 06/04/05
 AP film at 8568 hours shows interval increase in right pleural fluid.

CLINICAL DATA: Sepsis.  
 PORTABLE CHEST- 1 VIEW:

[view not recorded (1 of 2)]
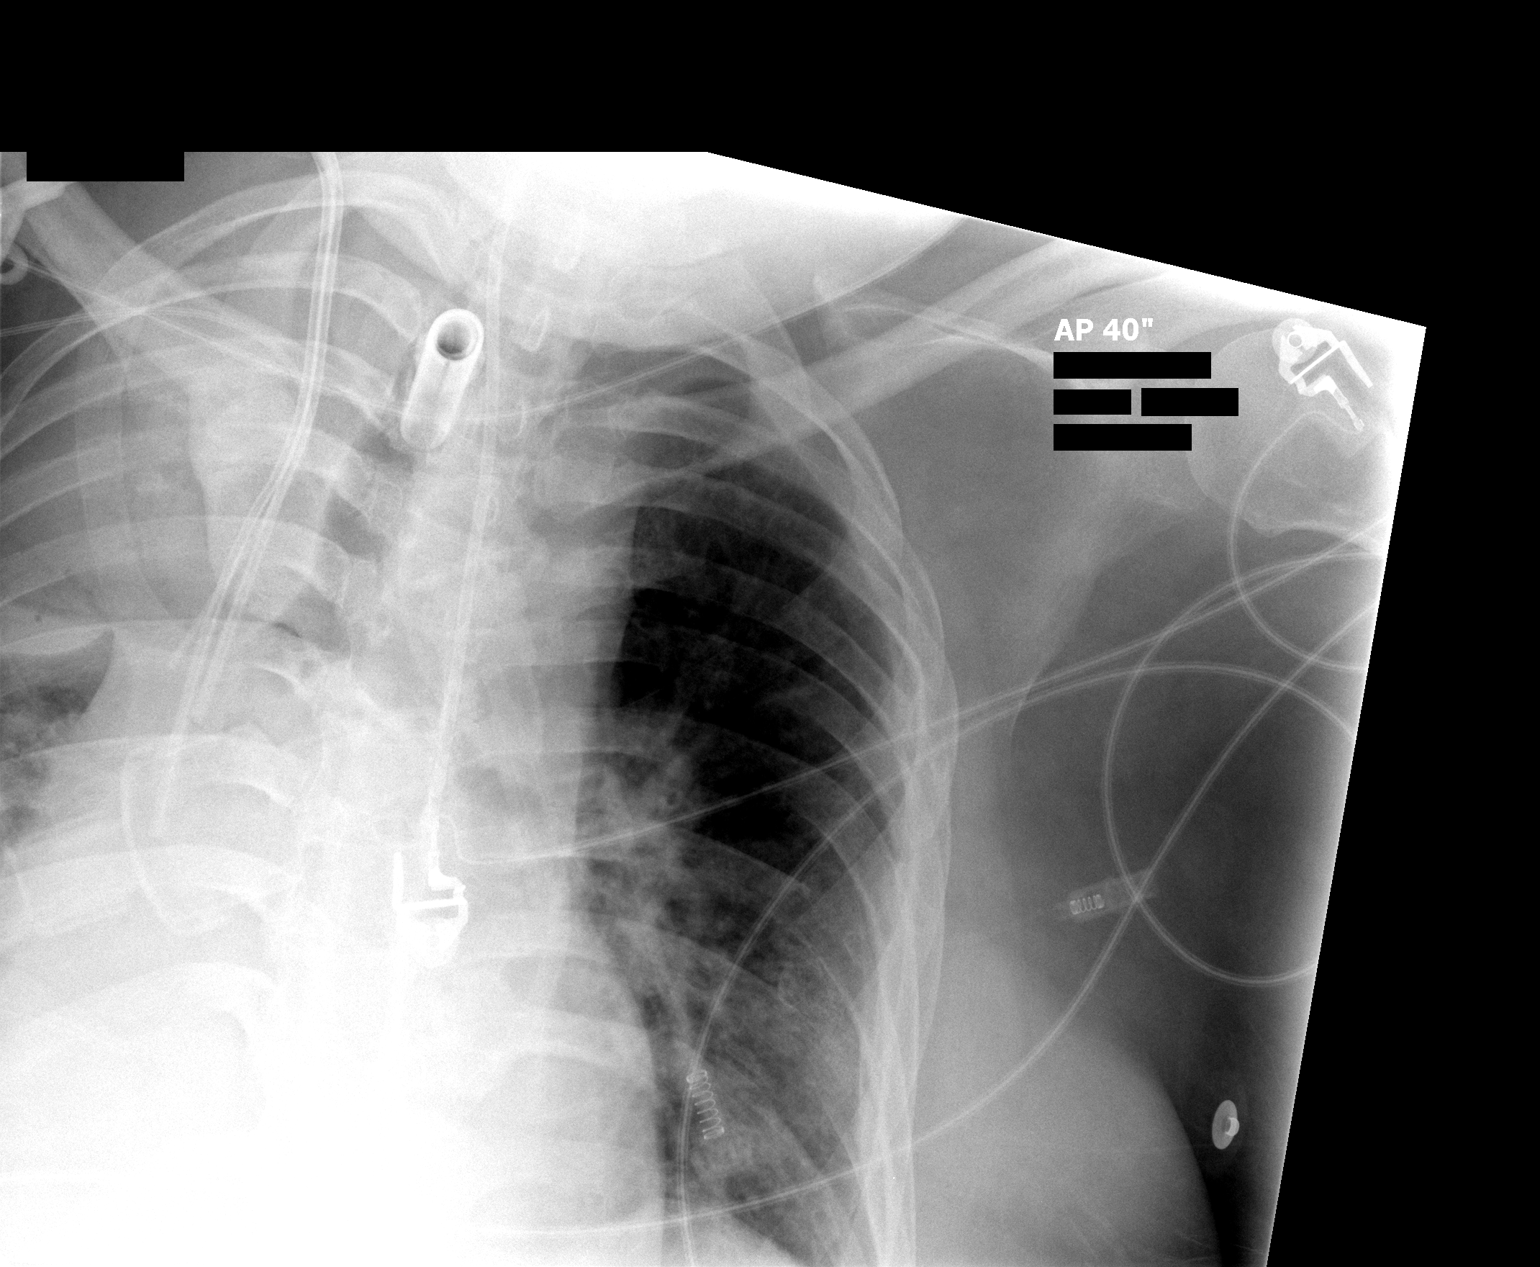

[view not recorded (2 of 2)]
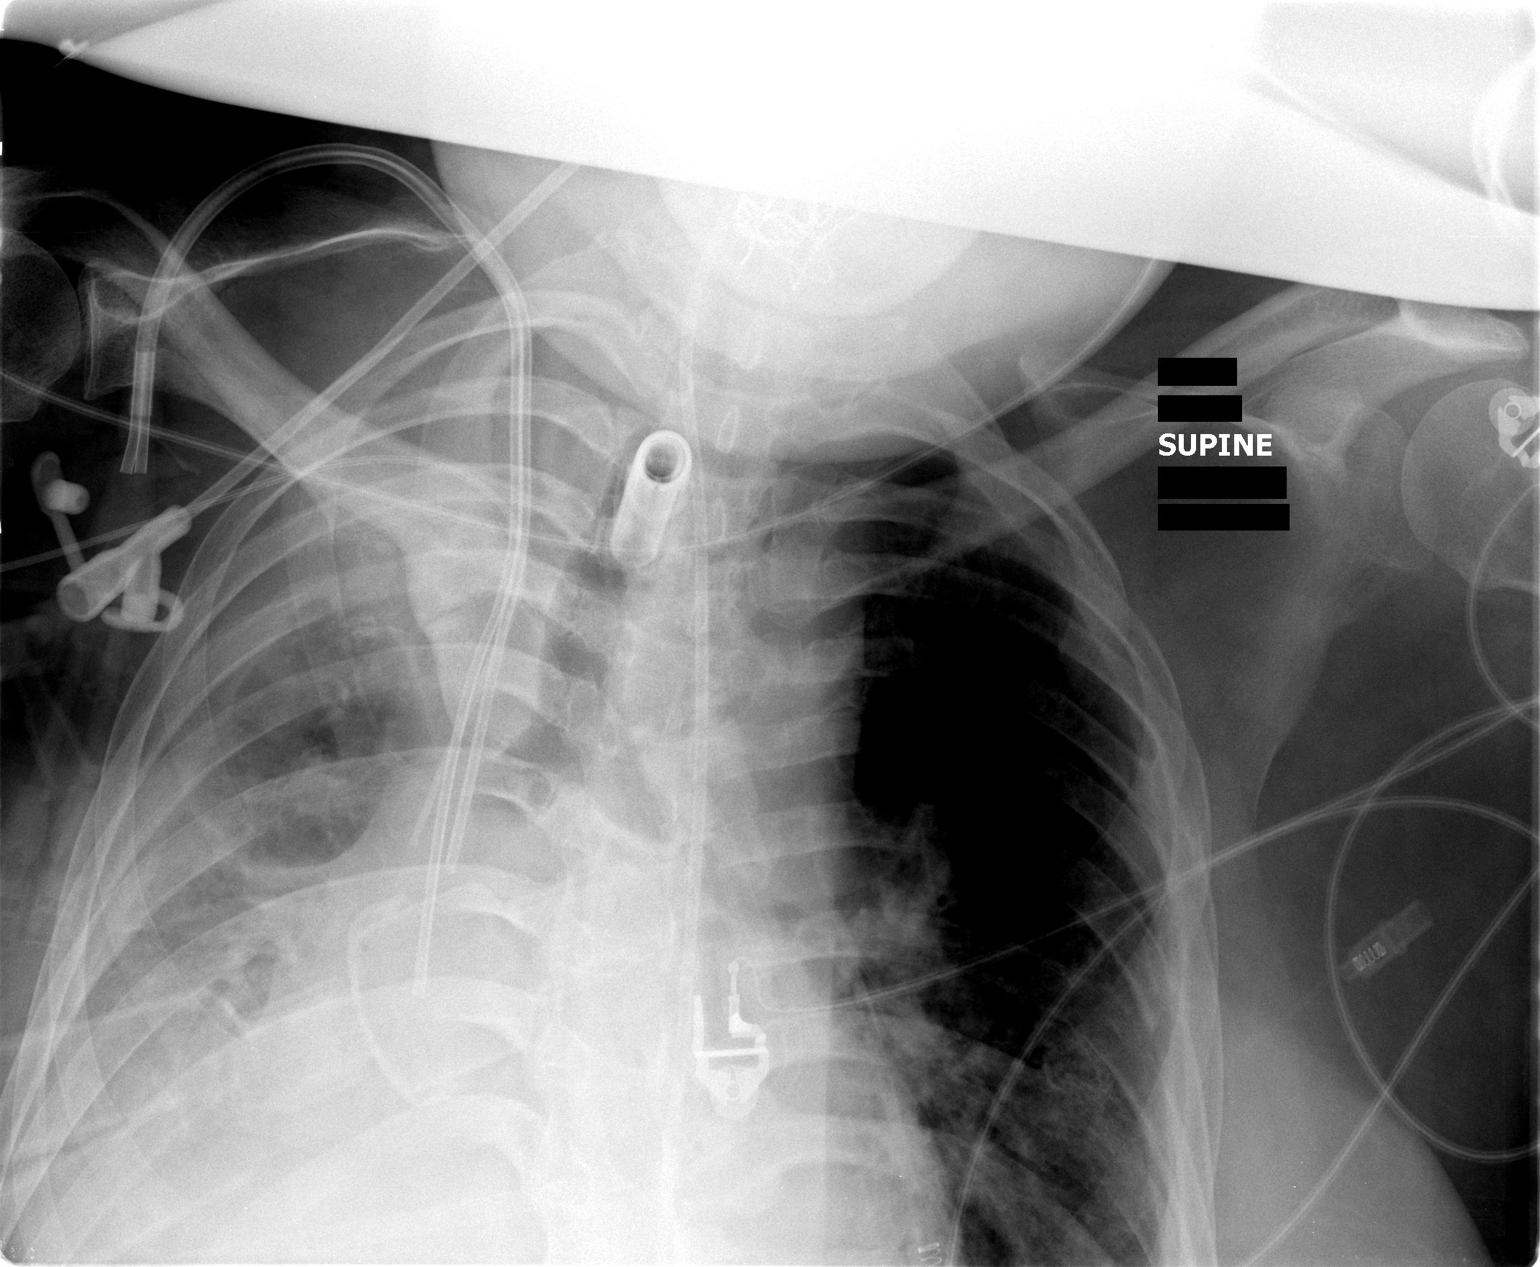

[2 of 2 positions shown; findings below may reference images not displayed]

The endotracheal tube, right PICC line and right-sided dialysis catheter remain in place.  Interstitial pulmonary edema noted.
IMPRESSION: Increasing right pleural fluid.

## 2008-01-19 IMAGING — CR DG CHEST 1V PORT
1 series · 1 of 1 positions shown · non-contrast
Comparison: Yesterday?s exam.

CLINICAL DATA: Sepsis.  Tracheostomy. 
 PORTABLE AP SEMIERECT CHEST ? 1 VIEW ([DATE] HOURS):

[view not recorded]
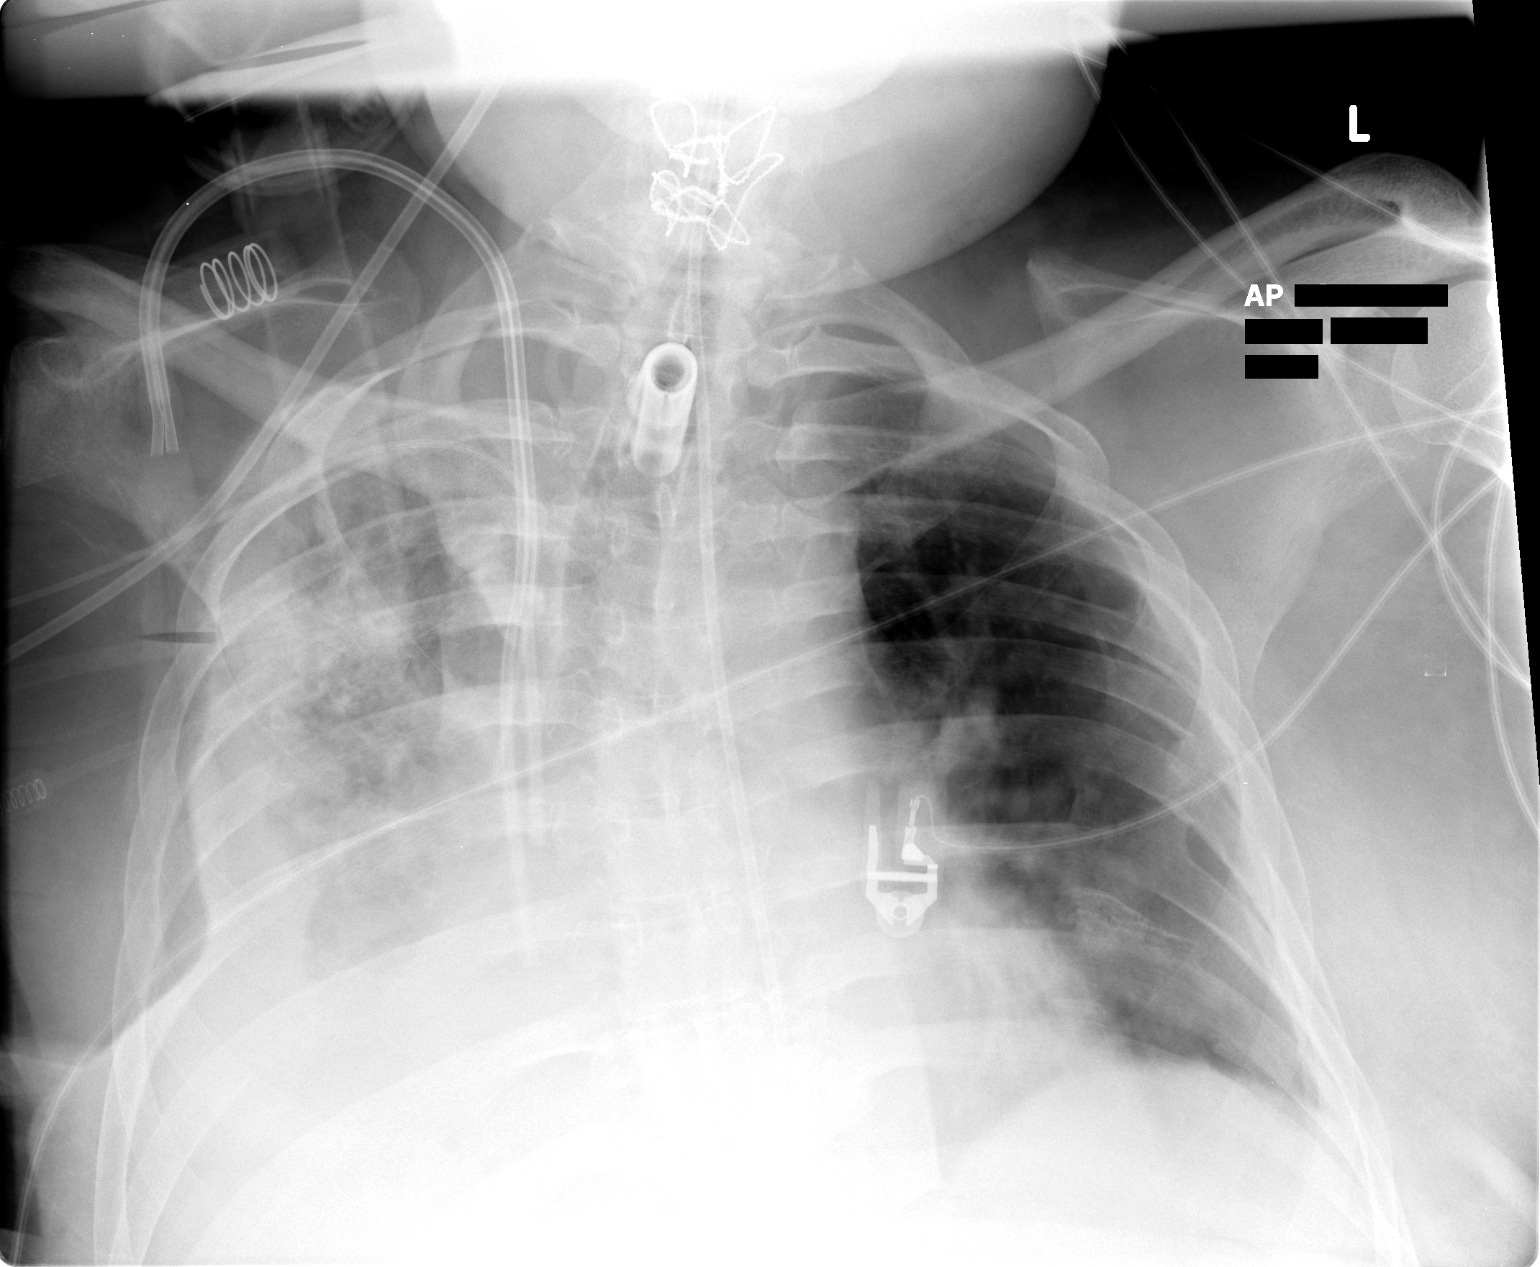

[1 of 1 positions shown; findings below may reference images not displayed]

FINDINGS: Tracheostomy tube appears in satisfactory position.  Double lumen right jugular central venous catheter tip catheter is noted with the tips at the SVC/right atrial junction and in the proximal right atrium.  Slight decrease in extensive right lung atelectasis.  Large right pleural effusion again noted.
IMPRESSION: Right lung under aeration slightly improved.  Large right pleural effusion.

## 2008-01-19 IMAGING — CR DG ABD PORTABLE 1V
1 series · 1 of 1 positions shown · non-contrast
Comparison: None

CLINICAL DATA: Feeding tube placement

PORTABLE ABDOMEN - 1 VIEW

[view not recorded]
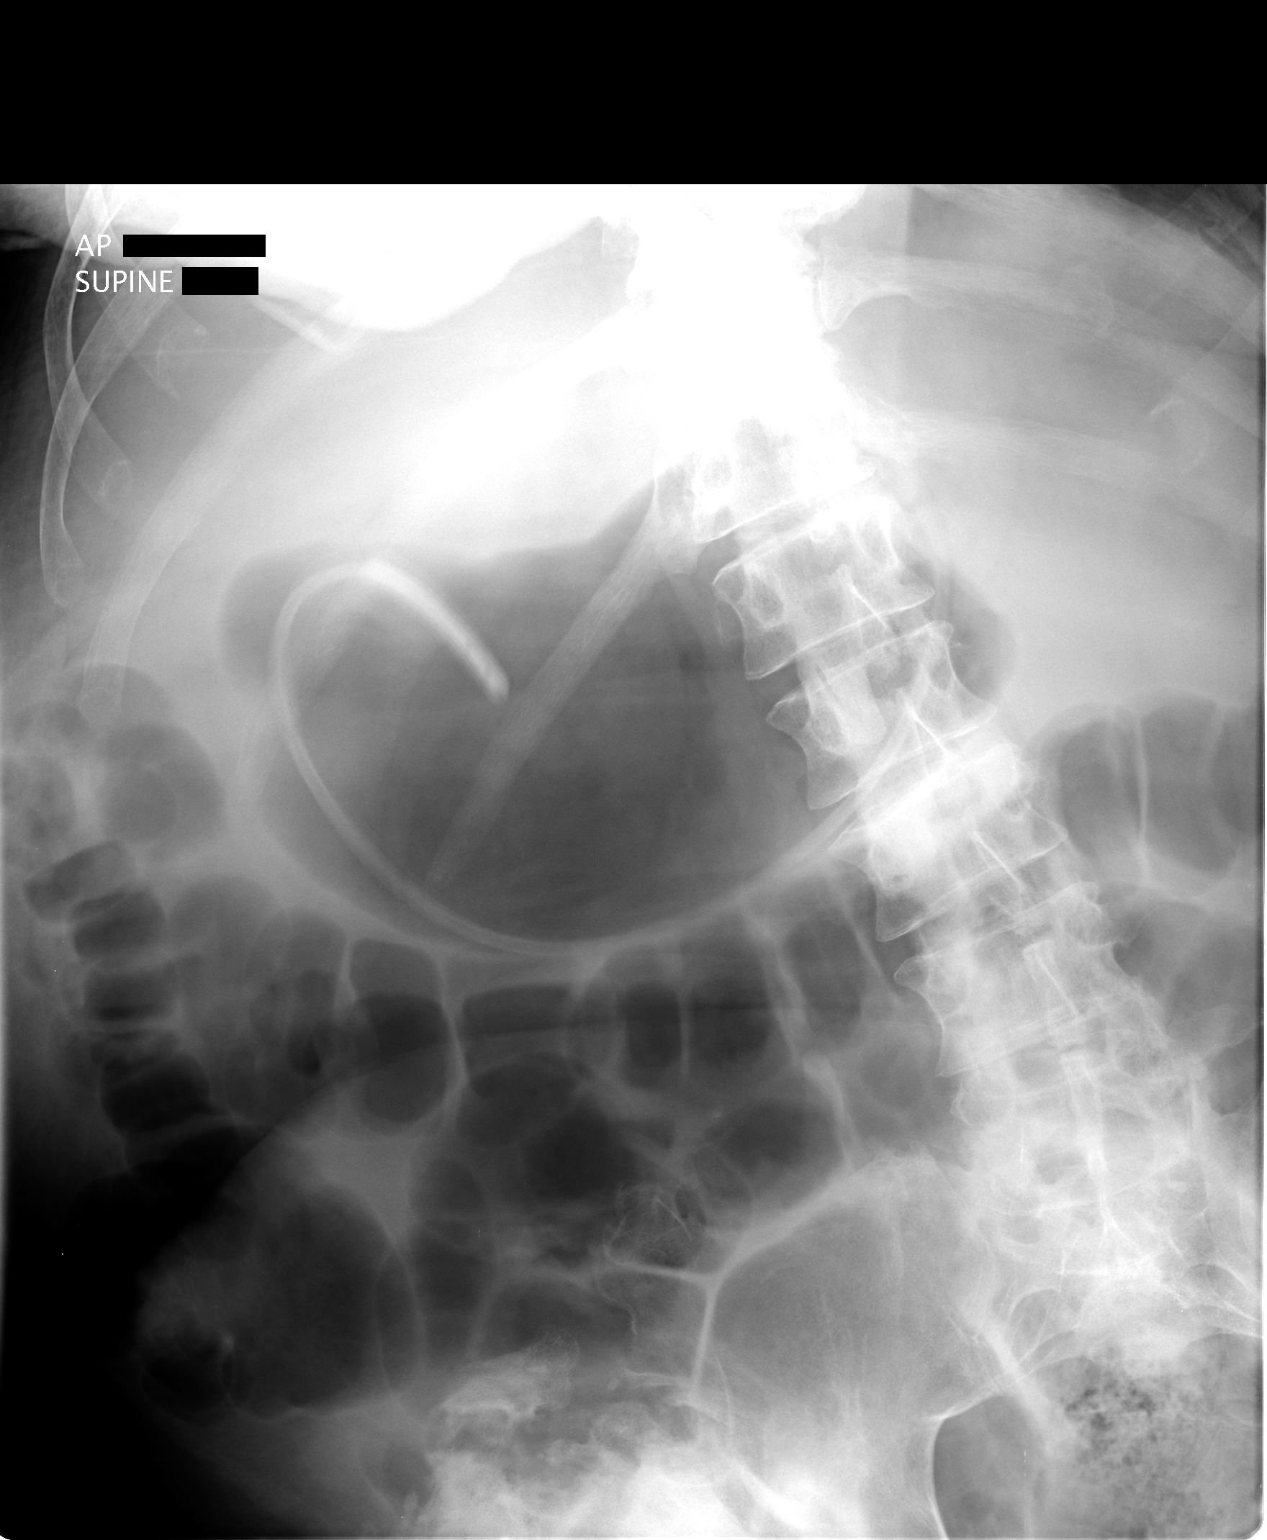

[1 of 1 positions shown; findings below may reference images not displayed]

FINDINGS: Feeding tube coils in the antrum of the stomach. There is mild
gaseous distention of the stomach and bowel diffusely.

IMPRESSION

Feeding tube coiling in the antrum of the stomach. Stomach is mildly distended
with gas.

## 2008-01-20 IMAGING — CR DG ABD PORTABLE 1V
1 series · 1 of 1 positions shown · non-contrast
Comparison: none

Clinical: Ileus

Portable abdomen at 1599:
Comparison 06/06/2005. Feeding tube loops in the fundus of the stomach. There is
marked gaseous distention of the stomach, now measuring greater than 20 cm
diameter. Scattered gas filled nondilated loops of small bowel and colon.
Advanced degenerative changes in both hips and bone loss in the right iliac wing
as before.

[view not recorded]
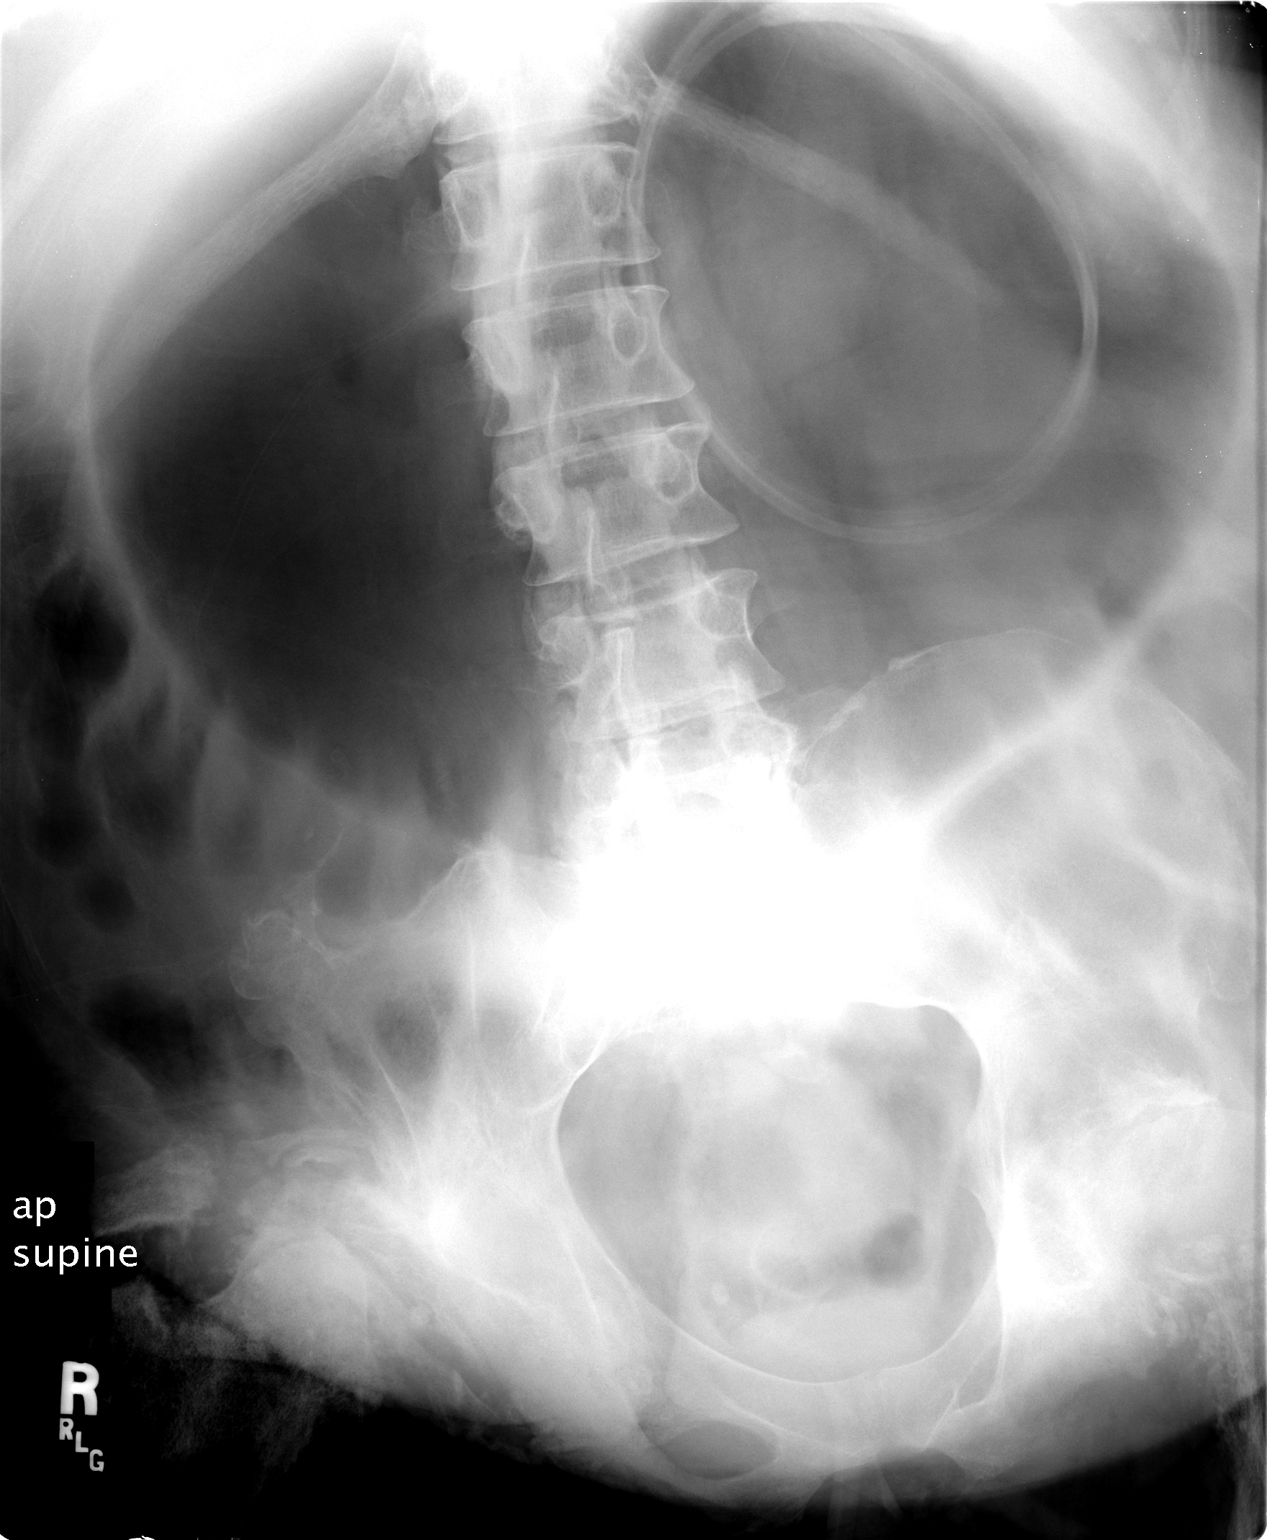

[1 of 1 positions shown; findings below may reference images not displayed]

IMPRESSION: 1. Marked gaseous dilatation of the stomach, containing feeding tube

## 2008-01-20 IMAGING — CR DG CHEST 1V PORT
1 series · 1 of 1 positions shown · non-contrast
Comparison: 06/06/05.

CLINICAL DATA: Sepsis.  
 PORTABLE CHEST ([DATE] HOURS):

[view not recorded]
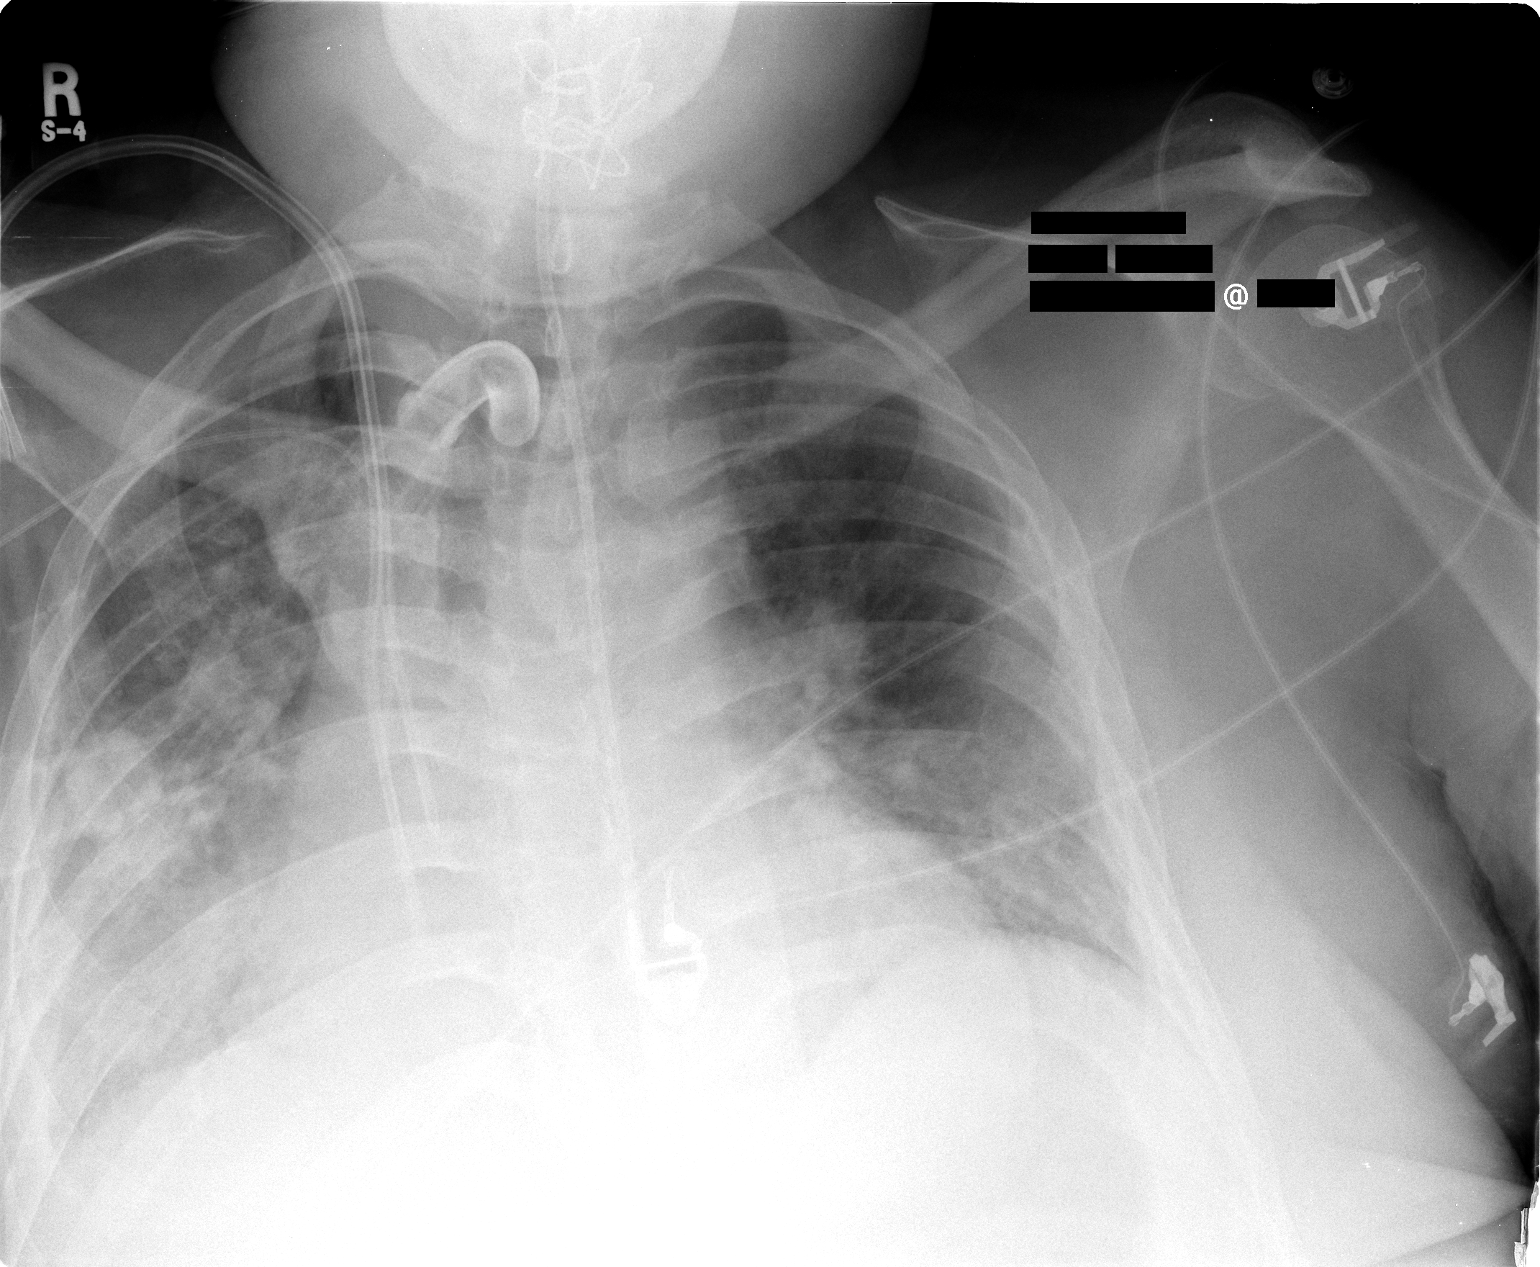

[1 of 1 positions shown; findings below may reference images not displayed]

FINDINGS: There is slight improvement in aeration of the right lung.  Opacities remain at the lung bases right greater than left.  Tracheostomy, right PICC line, and right central venous catheter remain.
IMPRESSION: Slightly better aeration on the right.

## 2008-01-21 IMAGING — CR DG CHEST 1V PORT
1 series · 1 of 1 positions shown · non-contrast
Comparison: 06/07/2005.

CLINICAL DATA: Left PICC line placement.  
 PORTABLE CHEST - 1 VIEW:

[view not recorded]
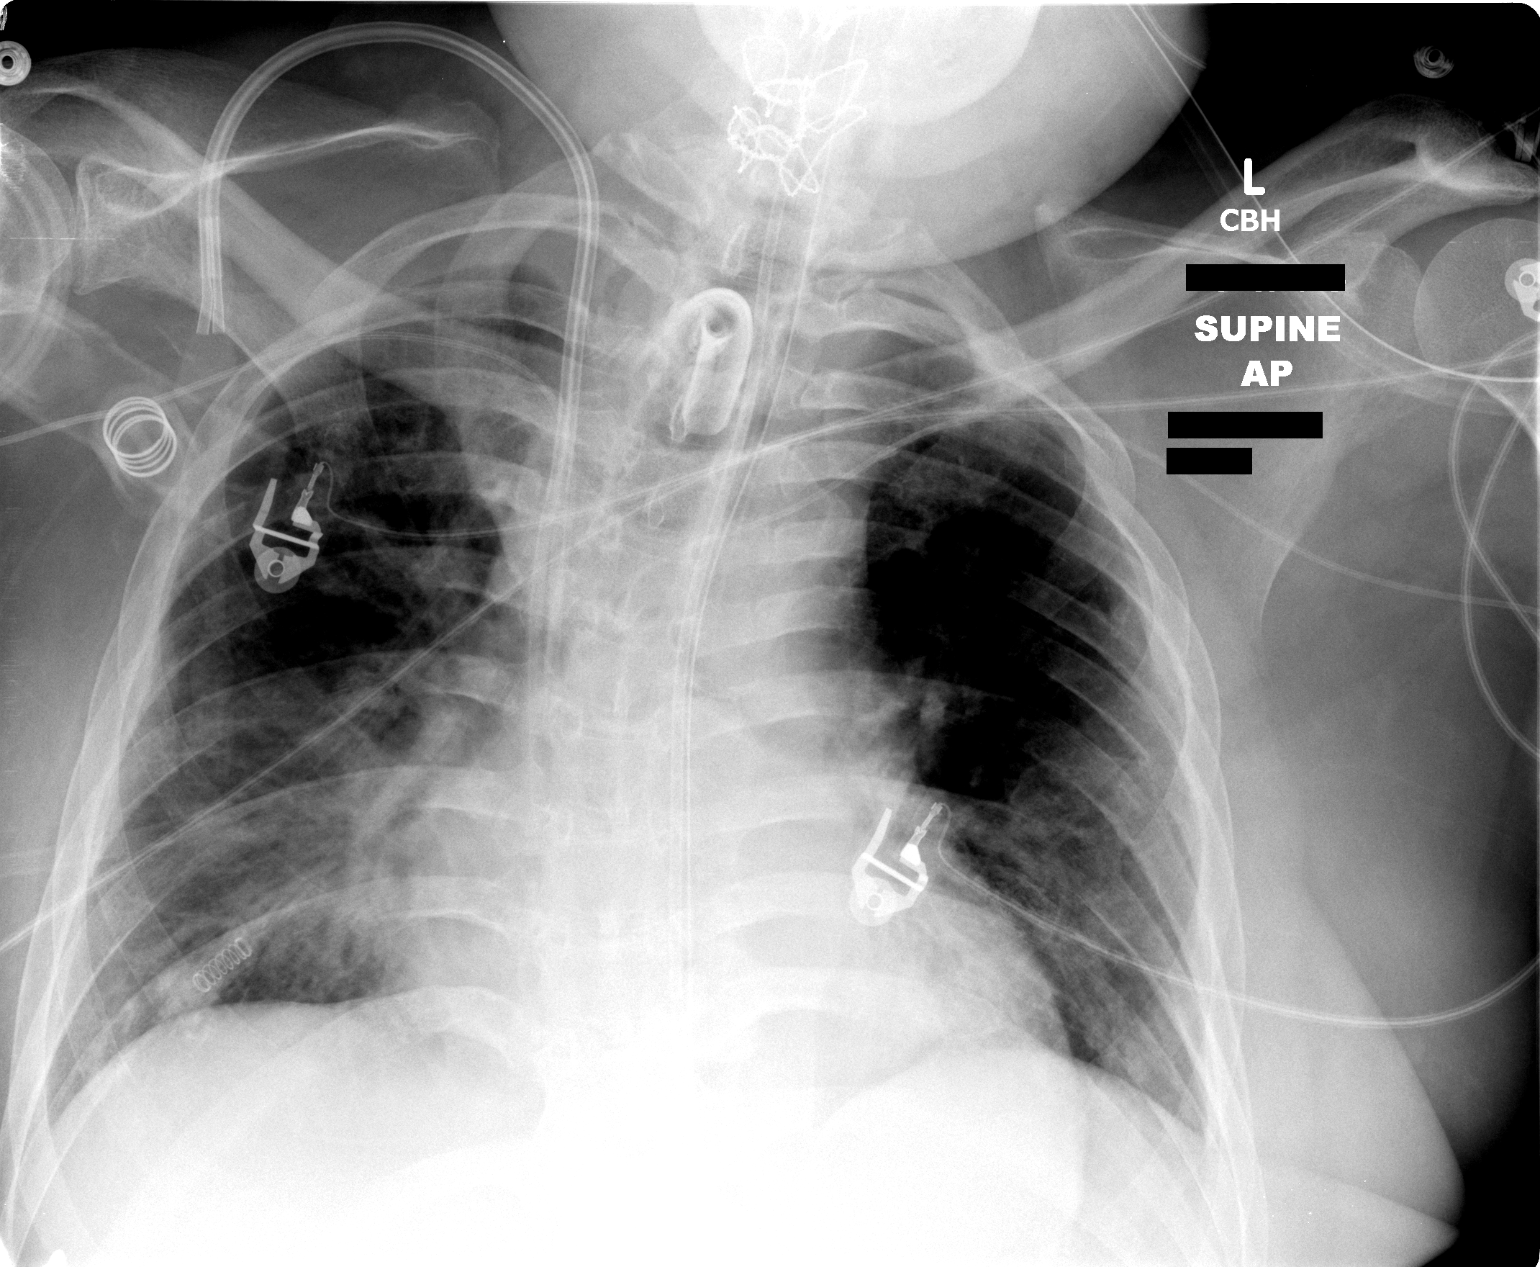

[1 of 1 positions shown; findings below may reference images not displayed]

AP film 4644 hours shows interval placement of the left PICC line with the tip near the innominate confluence, possibly just into the proximal SVC.  The right PICC line remains in place.  There is a right IJ dialysis catheter.  Tracheostomy tube, feeding tube and NG tube again noted.  The NG tube is new in the interval and the tips of the NG and feeding tubes have not been visualized.  
 Interstitial and patchy basilar airspace disease persists but is slightly improved in the interval.  Cardiopericardial silhouette remains enlarged.
IMPRESSION: 1.  New left PICC line tip is at the innominate confluence or possibly just into the proximal SVC.  
 2.  Slight improvement in lung aeration.  
 3.  New NG tube although the distal tip has not been visualized.

## 2008-01-22 IMAGING — CR DG ABD PORTABLE 1V
1 series · 1 of 1 positions shown · non-contrast
Comparison: none

CLINICAL DATA: Sepsis, on ventilator, feeding tube placement.
 PORTABLE ABDOMEN - 1 VIEW, 06/09/05 AT 2503 HOURS:
 Feeding tube is looped in the proximal stomach with the tip in the fundus.  The bowel gas pattern is nonspecific.

[view not recorded]
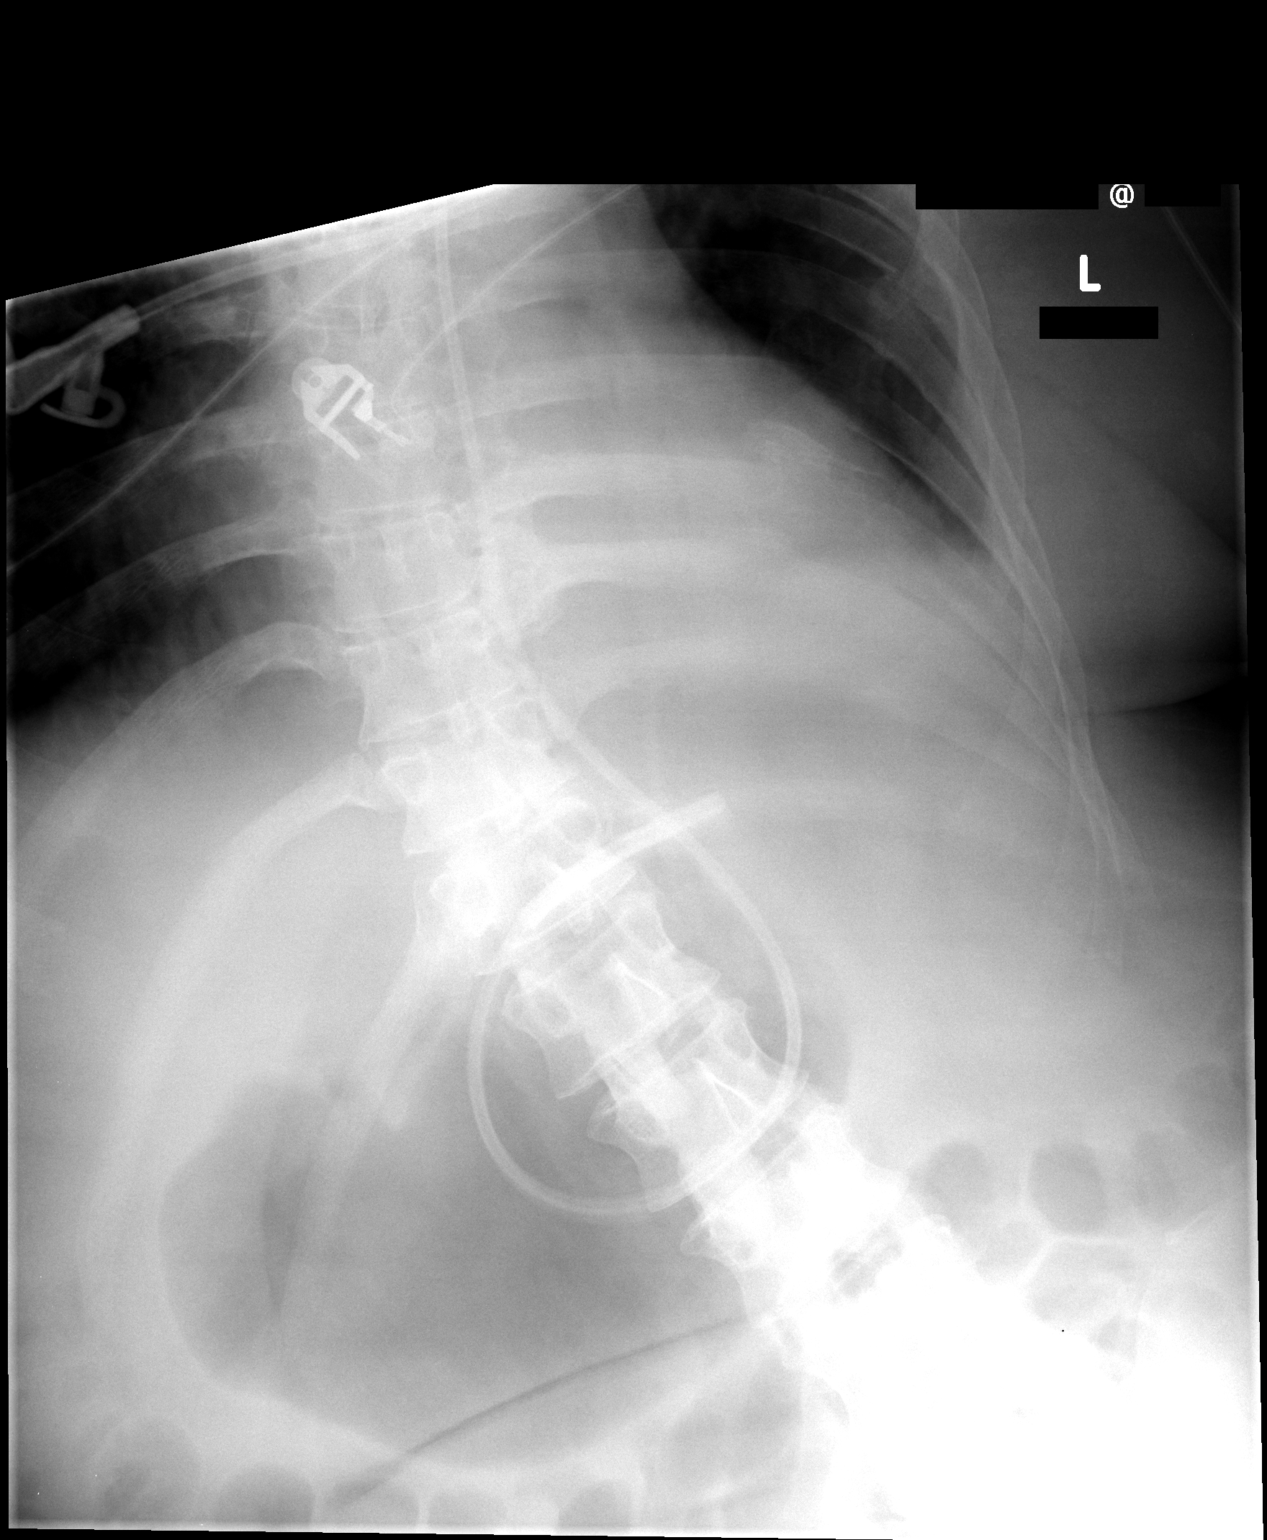

[1 of 1 positions shown; findings below may reference images not displayed]

IMPRESSION: Feeding tube looped in stomach with tip in the fundus.

## 2008-01-22 IMAGING — CR DG ABD PORTABLE 1V
1 series · 1 of 1 positions shown · non-contrast
Comparison: Early the same day.

CLINICAL DATA: NG tube placement. 
 ABDOMEN - 1N0LC ? 06/09/05:

[view not recorded]
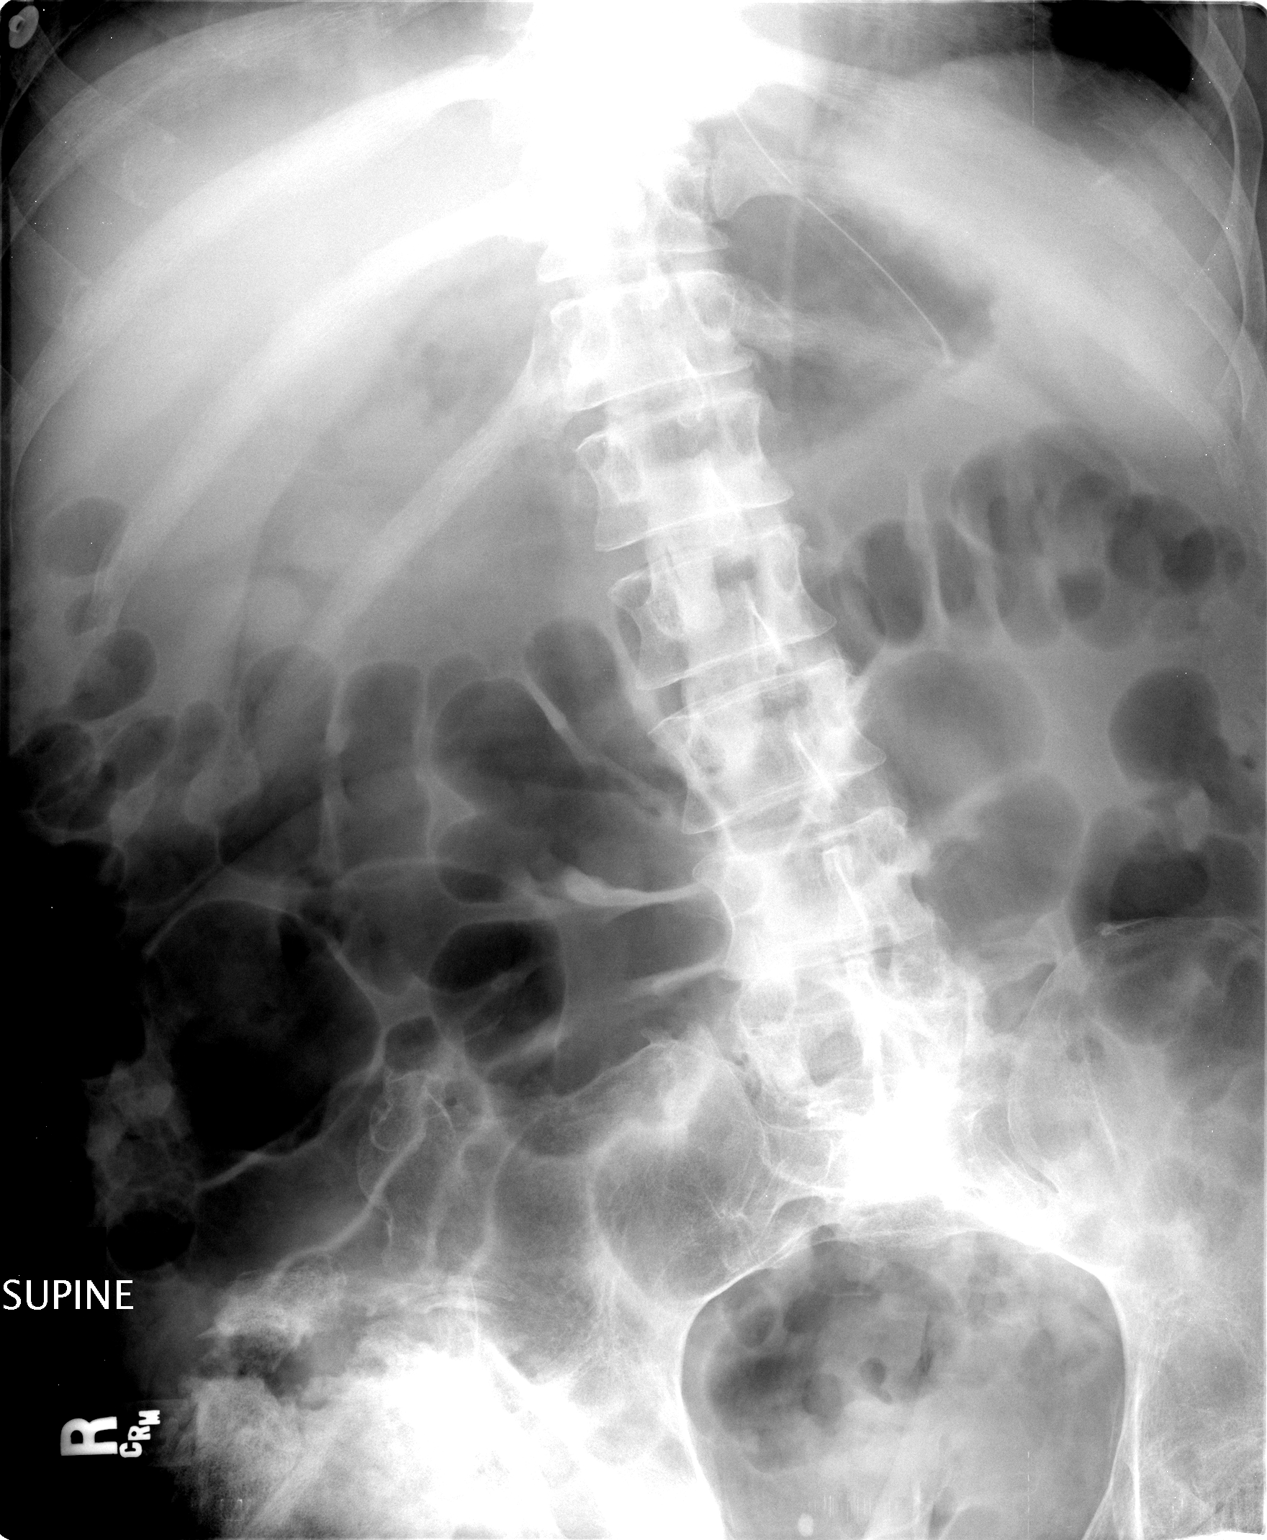

[1 of 1 positions shown; findings below may reference images not displayed]

FINDINGS: Feeding tube has been removed.   NG tube is in place with the side port in the stomach in good position.
IMPRESSION: NG tube in good position.

## 2008-01-25 IMAGING — CR DG CHEST 1V PORT
1 series · 1 of 1 positions shown · non-contrast
Comparison: 06/11/05.

CLINICAL DATA: Sepsis and respiratory failure.  
 PORTABLE CHEST - 1 VIEW, 06/12/05 AT 2752 HOURS:

[view not recorded]
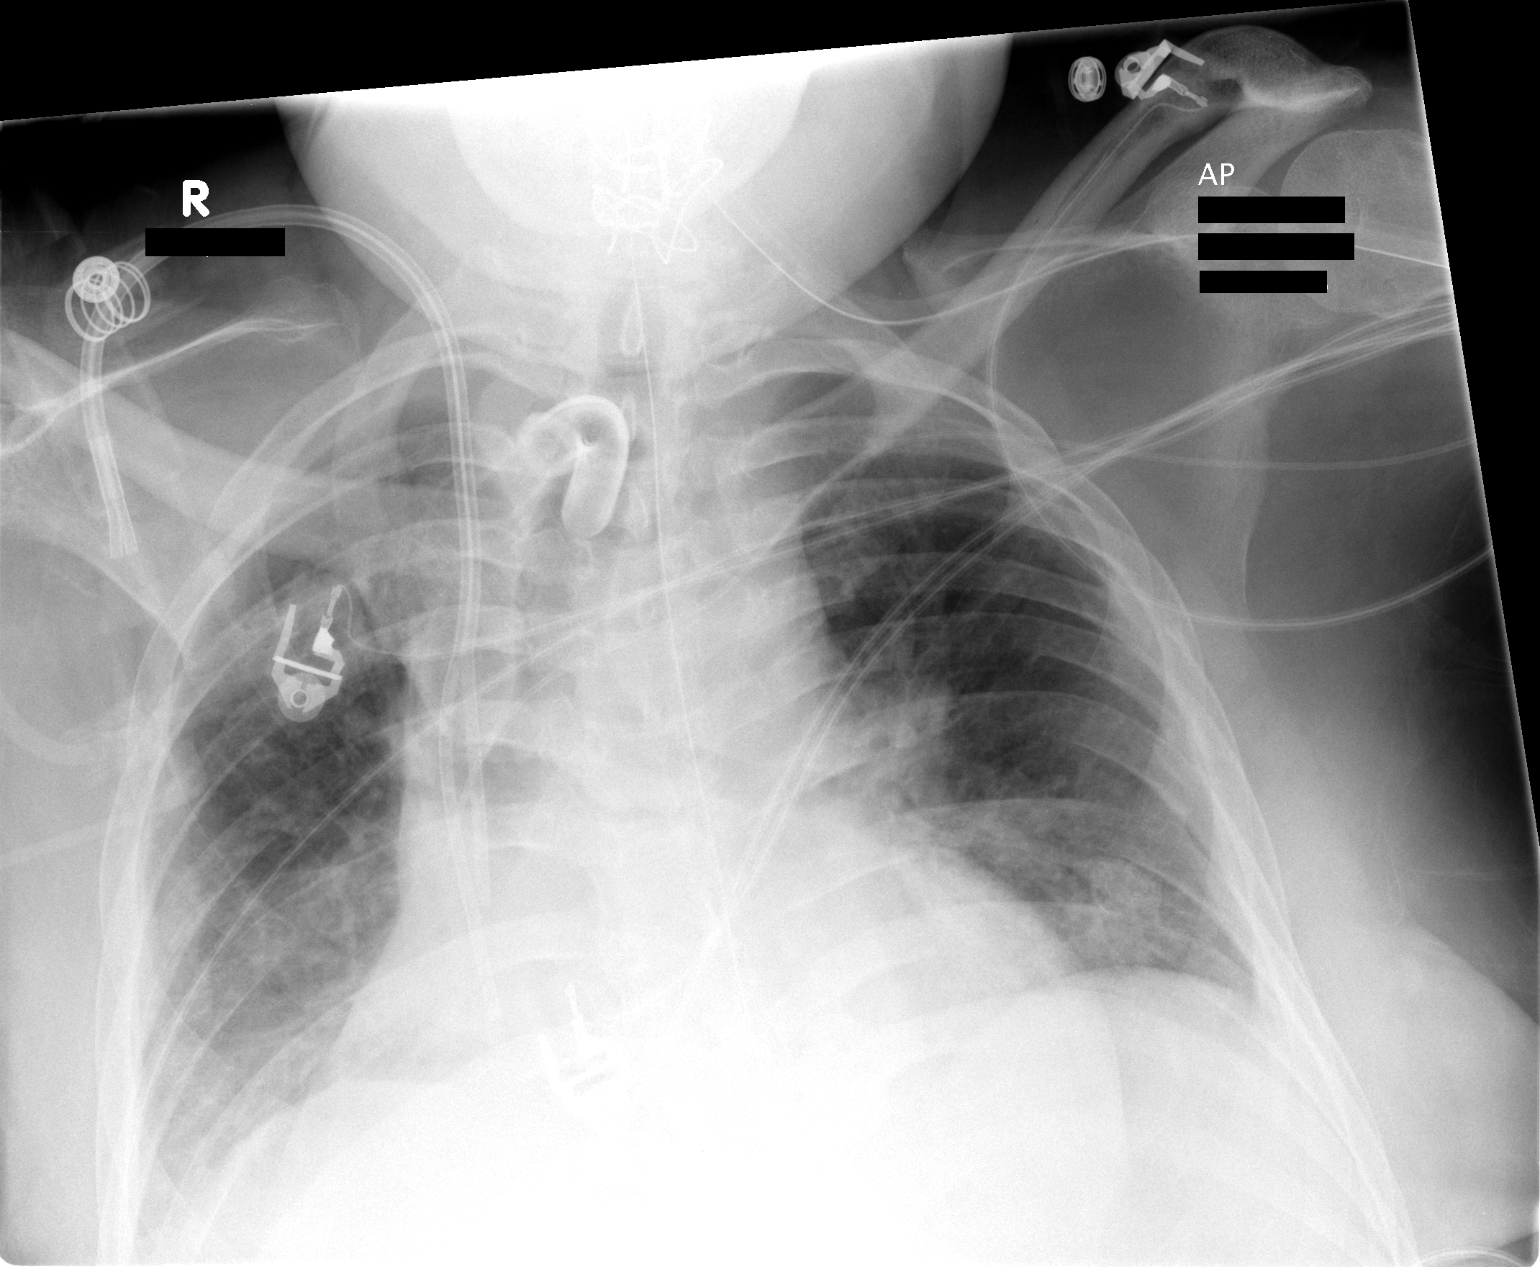

[1 of 1 positions shown; findings below may reference images not displayed]

FINDINGS: Dialysis catheter and tracheostomy tube position are stable.  Overall lung volumes slightly decreased with bibasilar atelectasis present.  No overt edema.  Stable cardiomegaly.
IMPRESSION: Slight decrease in lung volumes secondary to bibasilar atelectasis.

## 2008-01-25 IMAGING — RF DG FLUORO RM 1-60 MIN
1 series · 1 of 1 positions shown · non-contrast
Comparison: none

CLINICAL DATA: 39-year-old male with sepsis.  Panda tube under fluoroscopy has been requested. 
PANDA TUBE PLACEMENT UNDER FLUOROSCOPY ? 06/12/05:
Under fluoroscopy, the panda tube was inserted easily through the right nostril, down the esophagus, and into the stomach.  With several manipulations and the assistance of an Amplatz superstiff guidewire, the panda tube was advanced into the second portion of the duodenum.  Gastroview was injected, which confirmed placement.  The panda tube was then flushed well with saline and secured to the patient's nose.  The patient tolerated the procedure well, and there were no immediate complications. 
Contrast used:  Approximately 15 cc Gastroview.
Fluoro time:  5.2 minutes.

[Series 1: run · 1 of 1 slices shown]
[im 1/1]
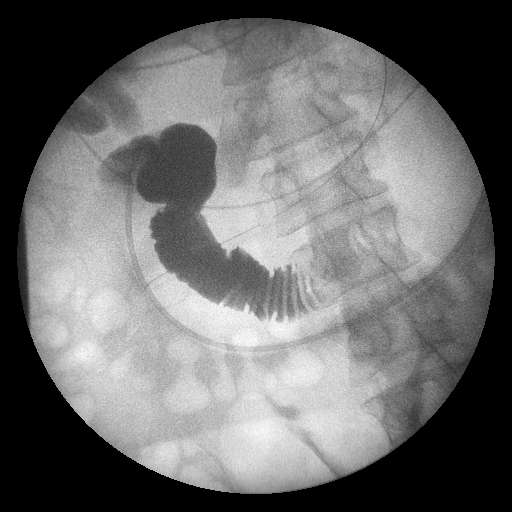

[1 of 1 positions shown; findings below may reference images not displayed]

IMPRESSION: Successful placement of a 10-French panda tube under fluoroscpy. Tip in second portion of duodenum.

## 2008-01-31 ENCOUNTER — Emergency Department (HOSPITAL_COMMUNITY): Admission: EM | Admit: 2008-01-31 | Discharge: 2008-02-01 | Payer: Self-pay | Admitting: Emergency Medicine

## 2008-03-26 ENCOUNTER — Emergency Department (HOSPITAL_COMMUNITY): Admission: EM | Admit: 2008-03-26 | Discharge: 2008-03-26 | Payer: Self-pay | Admitting: Emergency Medicine

## 2008-06-20 ENCOUNTER — Inpatient Hospital Stay (HOSPITAL_COMMUNITY): Admission: EM | Admit: 2008-06-20 | Discharge: 2008-06-27 | Payer: Self-pay | Admitting: Emergency Medicine

## 2008-09-25 ENCOUNTER — Encounter: Admission: RE | Admit: 2008-09-25 | Discharge: 2008-12-24 | Payer: Self-pay | Admitting: Internal Medicine

## 2009-01-31 IMAGING — CT CT ABDOMEN W/O CM
1 of 2 series · 14 of 32 positions shown, 18 images · IV contrast (agent unspecified)
Comparison: 06/19/2006 plain films.   No prior CTs for comparison.

CLINICAL DATA: Abdominal pain, nausea.  Abdominal distention.  Evaluate gastric outlet obstruction.  History of renal failure and colostomy.    
ABDOMEN CT WITHOUT CONTRAST:
TECHNIQUE: Multidetector CT imaging of the abdomen was performed following the standard protocol without IV contrast.
TECHNIQUE: Multidetector CT imaging of the pelvis was performed following the standard protocol without IV contrast.

[Series 4: abd_pel 5.0 b40f st · axial · 0.97mm/px · z∈[-428,+22]mm · 14 of 100 slices shown, 18 images]
[im 5/100  soft-tissue]
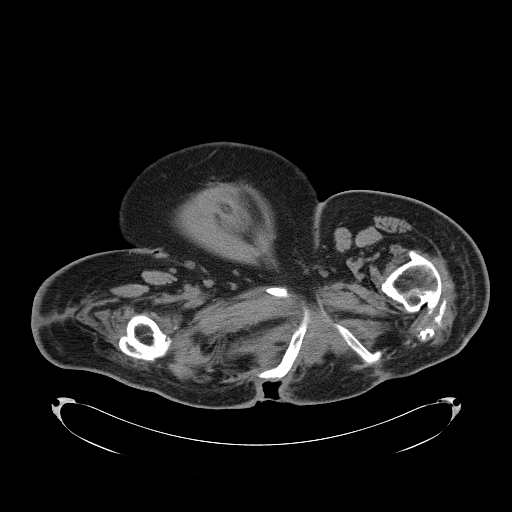
[im 5/100  bone]
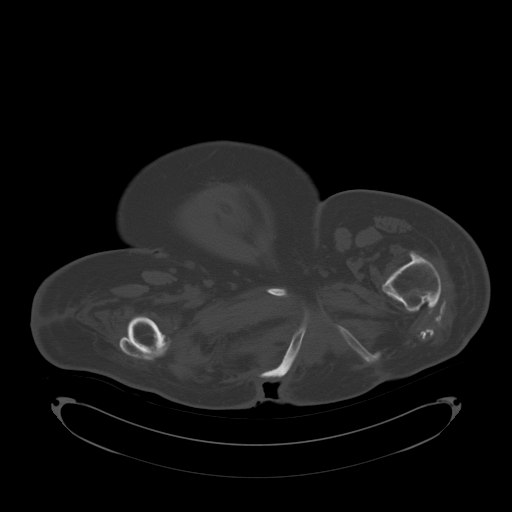
[im 13/100  soft-tissue]
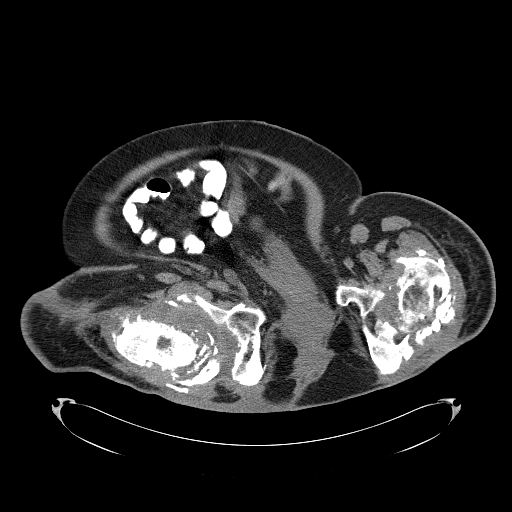
[im 21/100  soft-tissue]
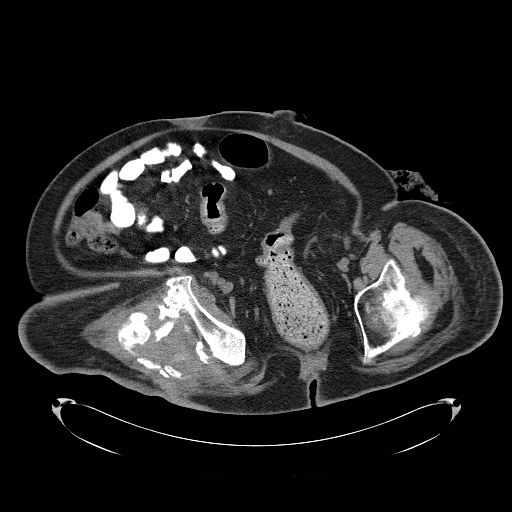
[im 29/100  soft-tissue]
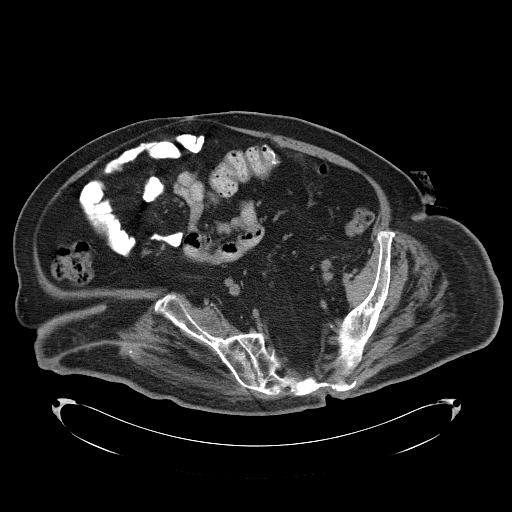
[im 38/100  soft-tissue]
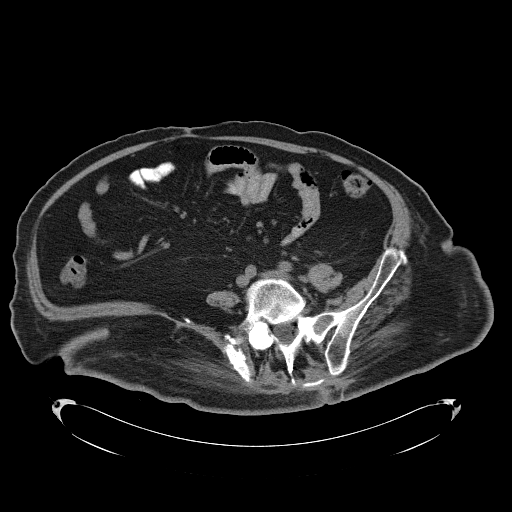
[im 46/100  soft-tissue]
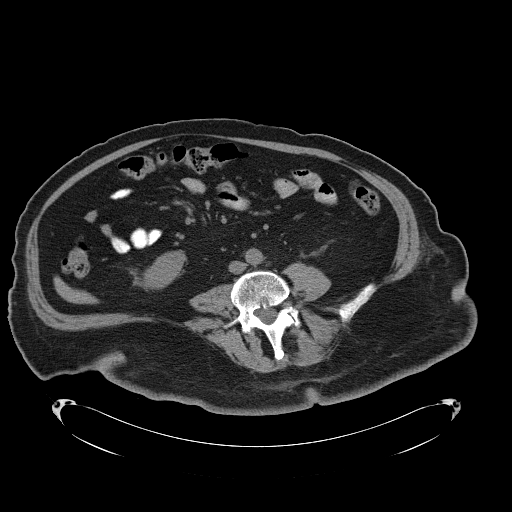
[im 54/100  soft-tissue]
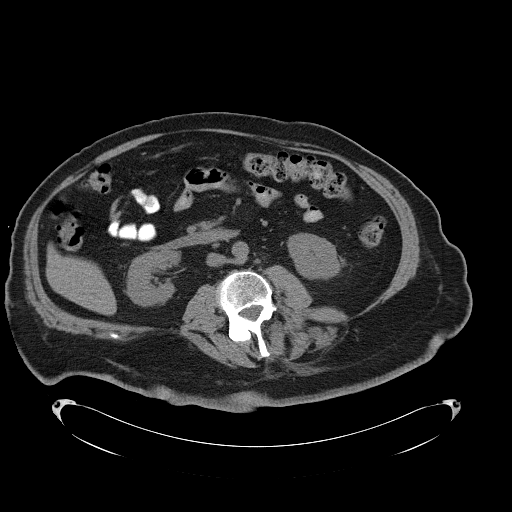
[im 62/100  soft-tissue]
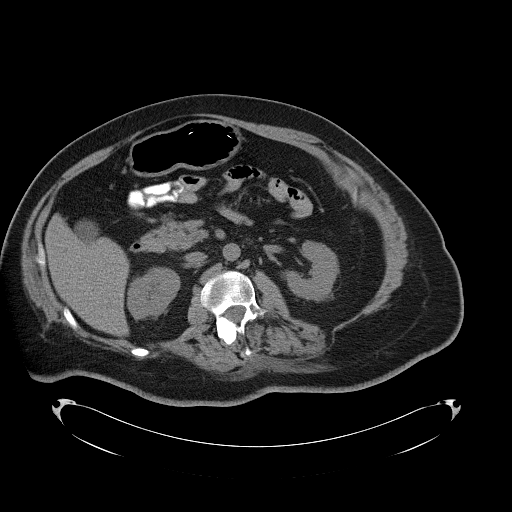
[im 71/100  soft-tissue]
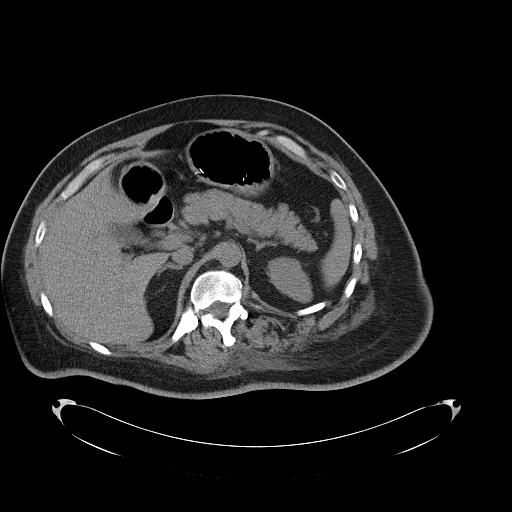
[im 71/100  bone]
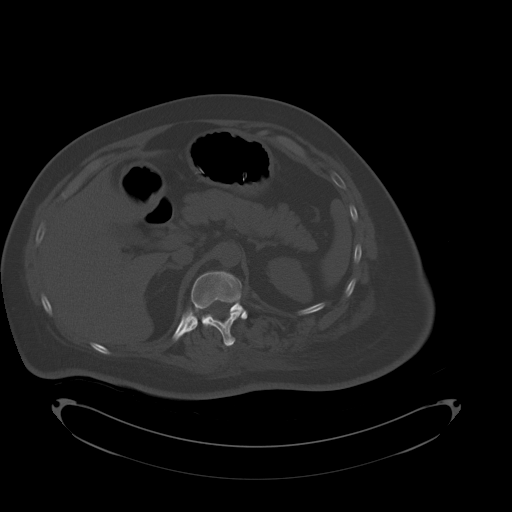
[im 79/100  soft-tissue]
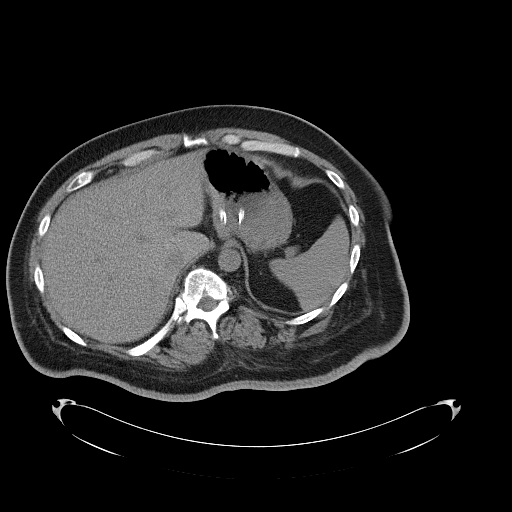
[im 83/100  lung]
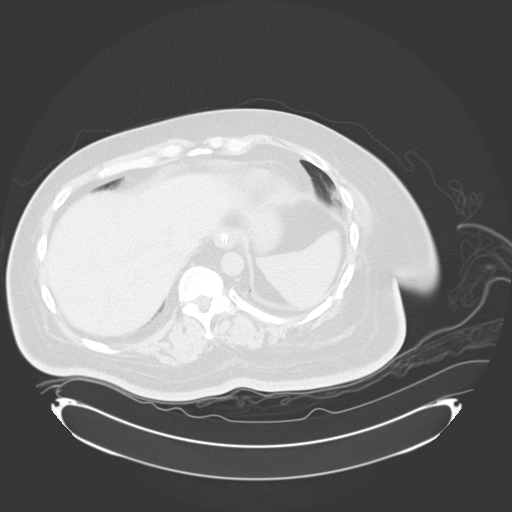
[im 87/100  soft-tissue]
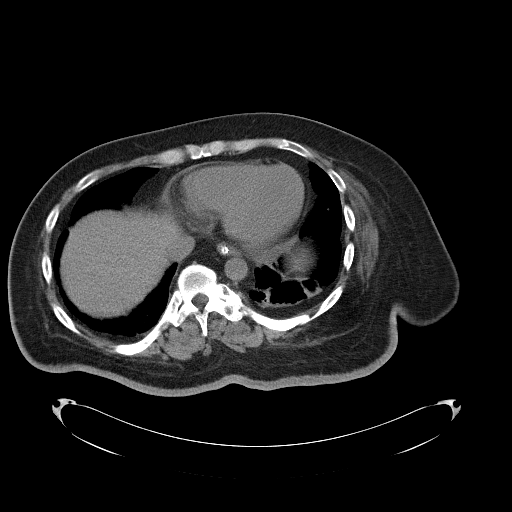
[im 87/100  lung]
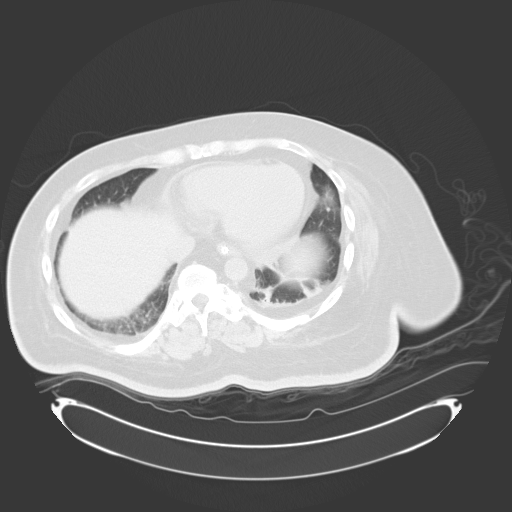
[im 91/100  lung]
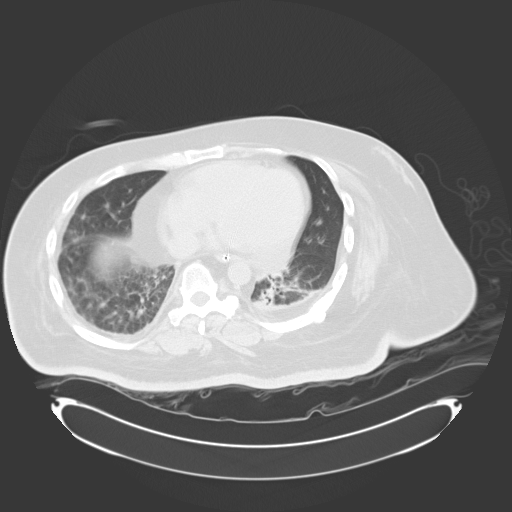
[im 95/100  soft-tissue]
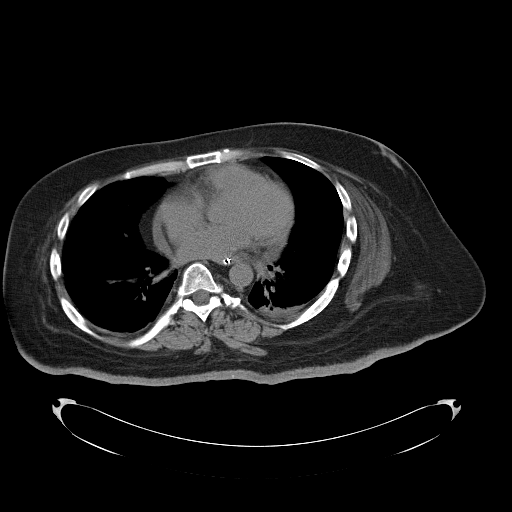
[im 95/100  lung]
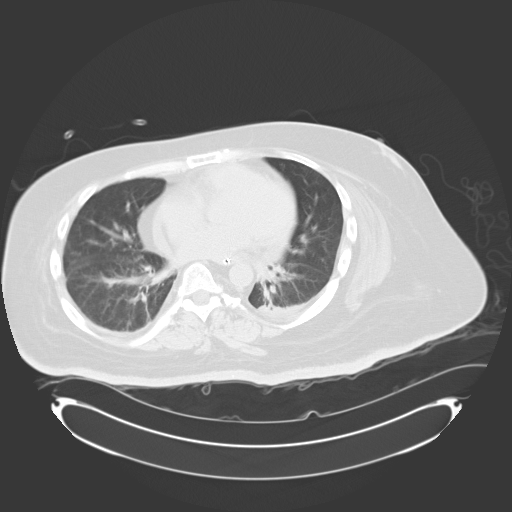

[14 of 32 positions shown; findings below may reference images not displayed]

FINDINGS: Mild left base atelectasis.  Left-sided pleural fat hypertrophy.  Mild cardiomegaly with trace likely physiologic pericardial fluid posteriorly.   The uninfused appearance of the liver and spleen is unremarkable.  There has been placement of a nasogastric tube.  This terminates just distal in the gastroesophageal junction.  There is no evidence of residual gastric distention.  There is no evidence of obstructive mass.  The duodenum is normal in caliber.  
The uninfused appearance of the pancreas, gallbladder, adrenal glands normal.  The kidneys are normal.  Retroaortic left renal vein is a normal variant.  There is no retroperitoneal or retrocrural adenopathy.  
Abdominal portions of the large and small bowel are normal.  The appendix is seen on image 75 and is unremarkable.
IMPRESSION: 1.  No acute process in the abdomen.  
2.   Interval placement of a nasogastric tube with resolution of gastric distention and no evidence of causative mass.                  
PELVIS CT WITHOUT CONTRAST:
FINDINGS: The patient is status post descending colostomy. There is also a Hartmann?s pouch.   There is a moderate amount of stool in the anus which could relate to a component of fecal impaction.  Pelvic small bowel is unremarkable.  Increased number of pelvic sidewall lymph nodes bilaterally.  There is also pericolonic adenopathy on image 75.  There is a suprapubic catheter in the urinary bladder which is contracted.  
There is an area of skin irregularity likely related to a decubitus ulcer about the left side of the sacrum on image 70.  This ulcer comes near to the cortical surface where there is an area of osseous irregularity on image 73 most consistent with osteomyelitis.  The coccyx is also foreshortened and this could be due to a component of chronic infection.  There is also a skin irregularity at the right ischial tuberosity likely related to a decubitus ulcer.  This continues to just superficial to the right ischial tuberosity.  There is sclerosis in this area suspicious for chronic infection.  
Both hips demonstrate severe fragmentation.
IMPRESSION: 1.  Status post descending colostomy with Hartmann?s pouch.  There is pericolonic lymphadenopathy which could be reactive in this patient with chronic infectious etiologies.  Correlate with the cause of the colostomy.  If this was performed for cancer, metastatic disease could also have such an appearance. 
2.  Question a component of fecal impaction. 
3.  Skin irregularity about the left side of the sacrum and right ischial tuberosity most consistent with decubitus ulcers.  Areas of chronic and/or acute osteomyelitis cannot be excluded. Consider dedicated pre- and postcontrast MRI. 
4.  Fragmented hips bilaterally likely chronic and could relate to neuropathic degeneration. 
5.  Mild pelvic sidewalladenopathy most likely reactive.

## 2009-01-31 IMAGING — CR DG CHEST 1V PORT
1 series · 1 of 1 positions shown · non-contrast
Comparison: 06/12/2005

CLINICAL DATA: PICC line placement.  
 PORTABLE CHEST - 06/19/2006:

[view not recorded]
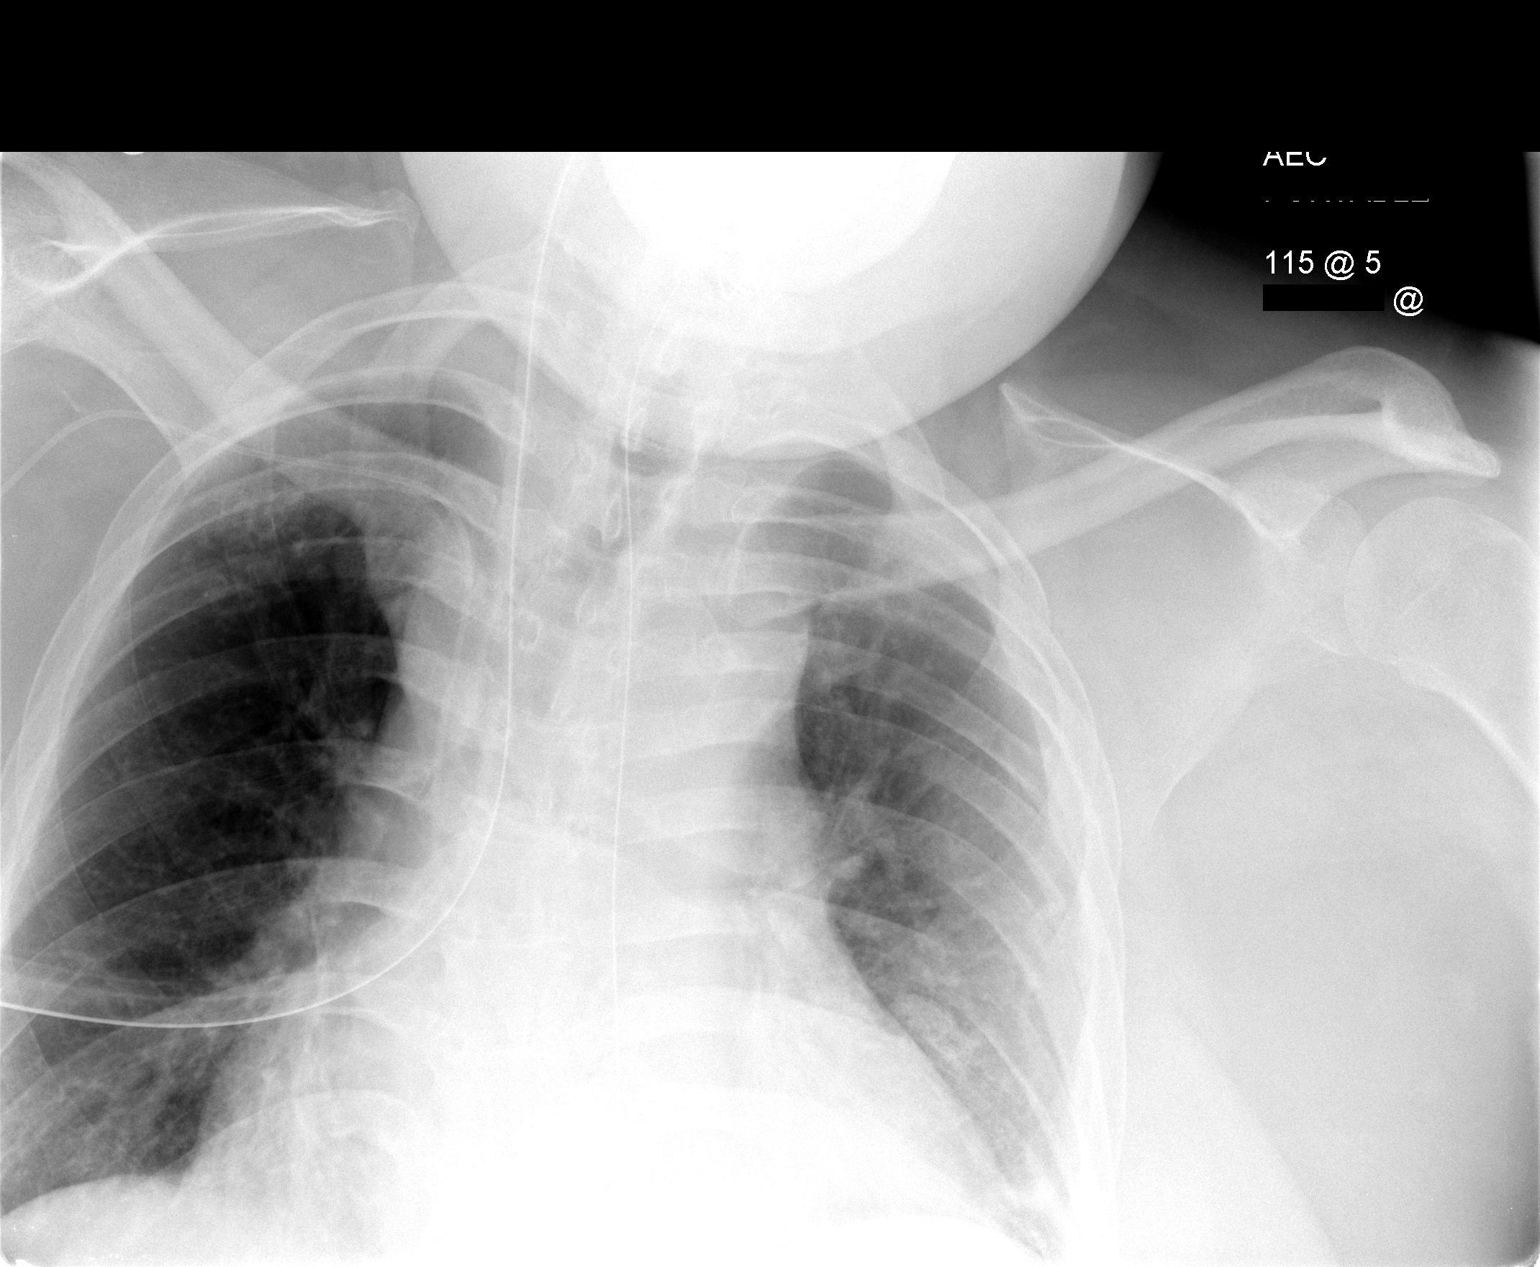

[1 of 1 positions shown; findings below may reference images not displayed]

FINDINGS: Nasogastric tube extends down the inferior aspect of this film.  The right costophrenic angle and the inferior most aspect of the left lung base are excluded.  There has been placement of a right-sided PICC line.   It is difficult to follow centrally.  It is felt to extend at least to the low right atrium.  The trachea is midline.  The heart is mildly enlarged.  Lung volumes are low.  No pneumothorax.  Left base atelectasis.
IMPRESSION: 1.  The right-sided PICC line is poorly visualized centrally.  It is felt to be low in position.  Consider retraction of approximately 8 cm with repeat film with increased penetration.  
 2.  Low lung volumes, cardiomegaly, mild left base atelectasis.

## 2009-01-31 IMAGING — CR DG CHEST 1V PORT
1 series · 1 of 1 positions shown · non-contrast
Comparison: none

CLINICAL DATA: PICC line placement.
 PORTABLE CHEST ? 1 VIEW:

[view not recorded]
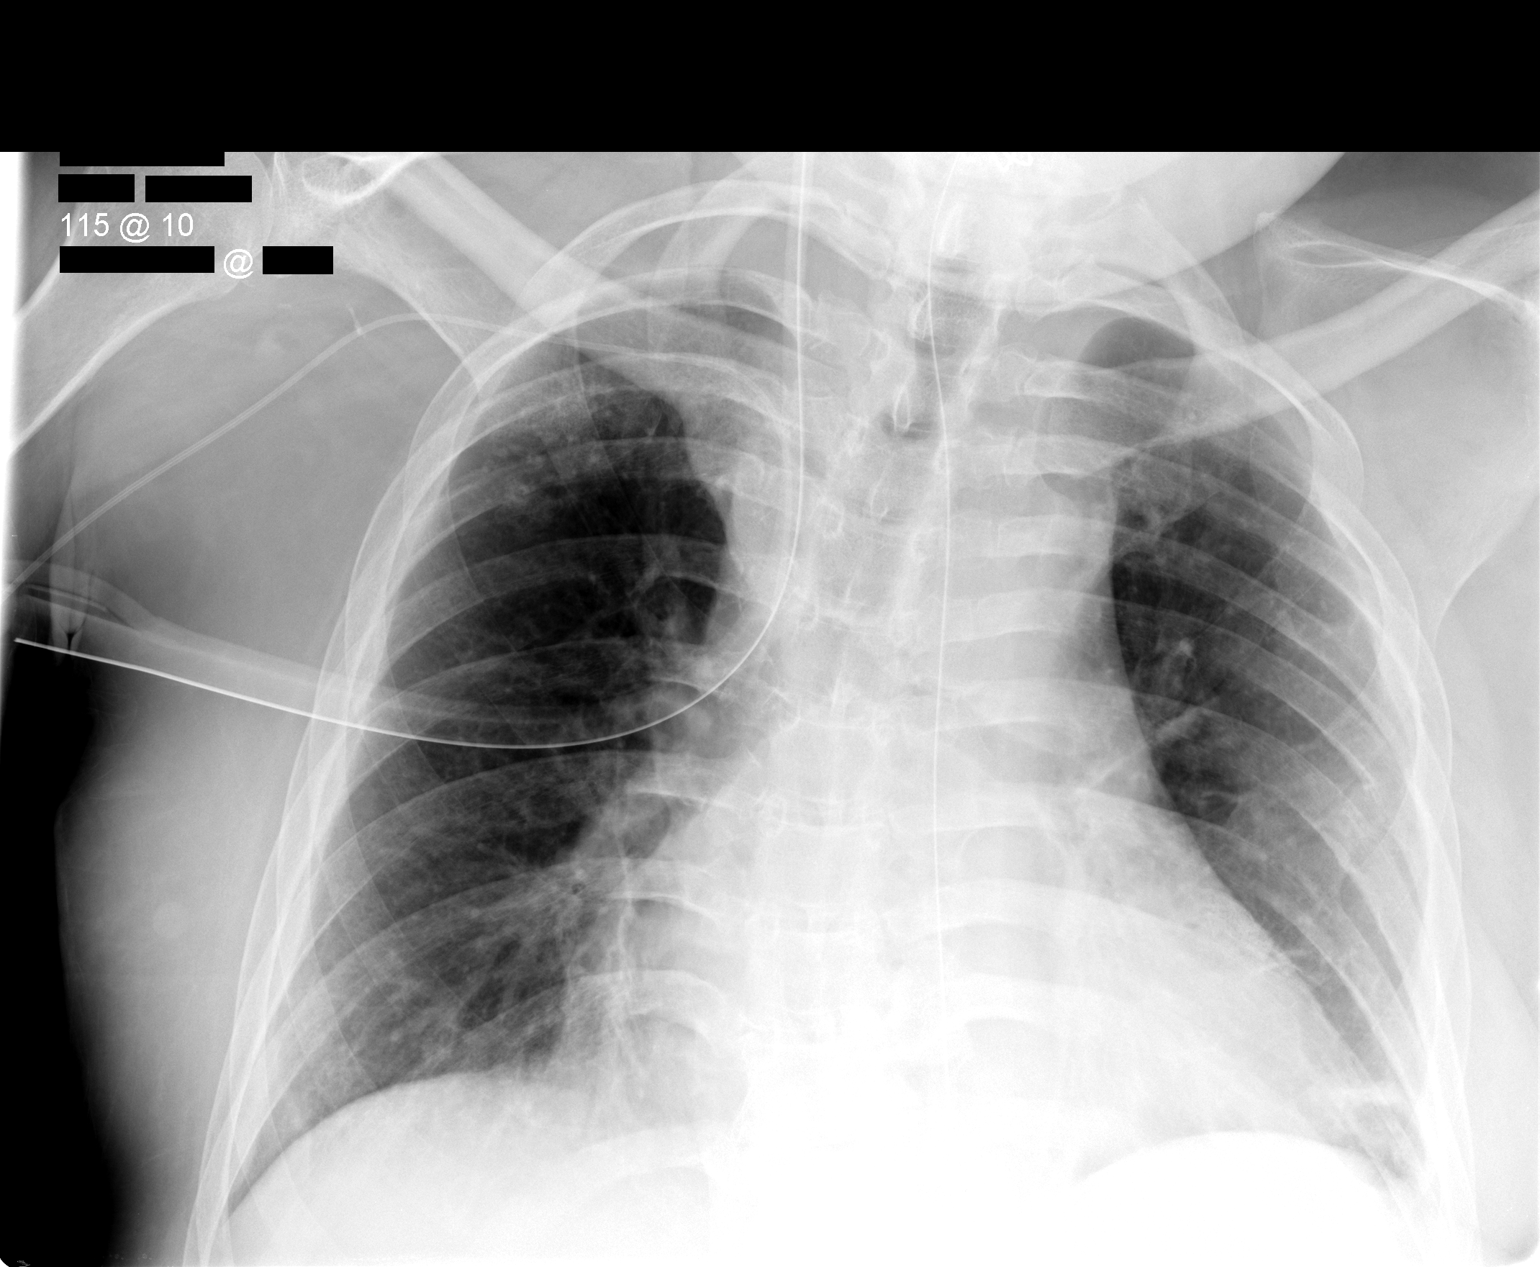

[1 of 1 positions shown; findings below may reference images not displayed]

FINDINGS: The right-sided PICC line has been repositioned.  It now terminates over the low SVC.  There is no pneumothorax.  Nasogastric tube extends beyond the inferior aspect of the film.  The trachea is midline.  The heart is mildly enlarged.  Lung volumes are low.  No congestive failure.  Mild left base atelectasis.  Question nodular opacity at the right upper lung zone.  This could be partially artifactual.
IMPRESSION: 1.  Right-sided PICC line terminates at the low SVC without evidence of pneumothorax.
 2.  Cardiomegaly and left base atelectasis again identified. 
 3.  Question nodular opacity at the right upper lung zone.  Recommend attention on follow up exams.

## 2009-02-01 IMAGING — CR DG ABDOMEN 2V
4 series · 4 of 4 positions shown · non-contrast
Comparison: 06/19/06.

CLINICAL DATA: Gastric outlet obstruction.  Question small bowel obstruction.  
 ABDOMEN ? 2 VIEW:

[w abdomen decub *]
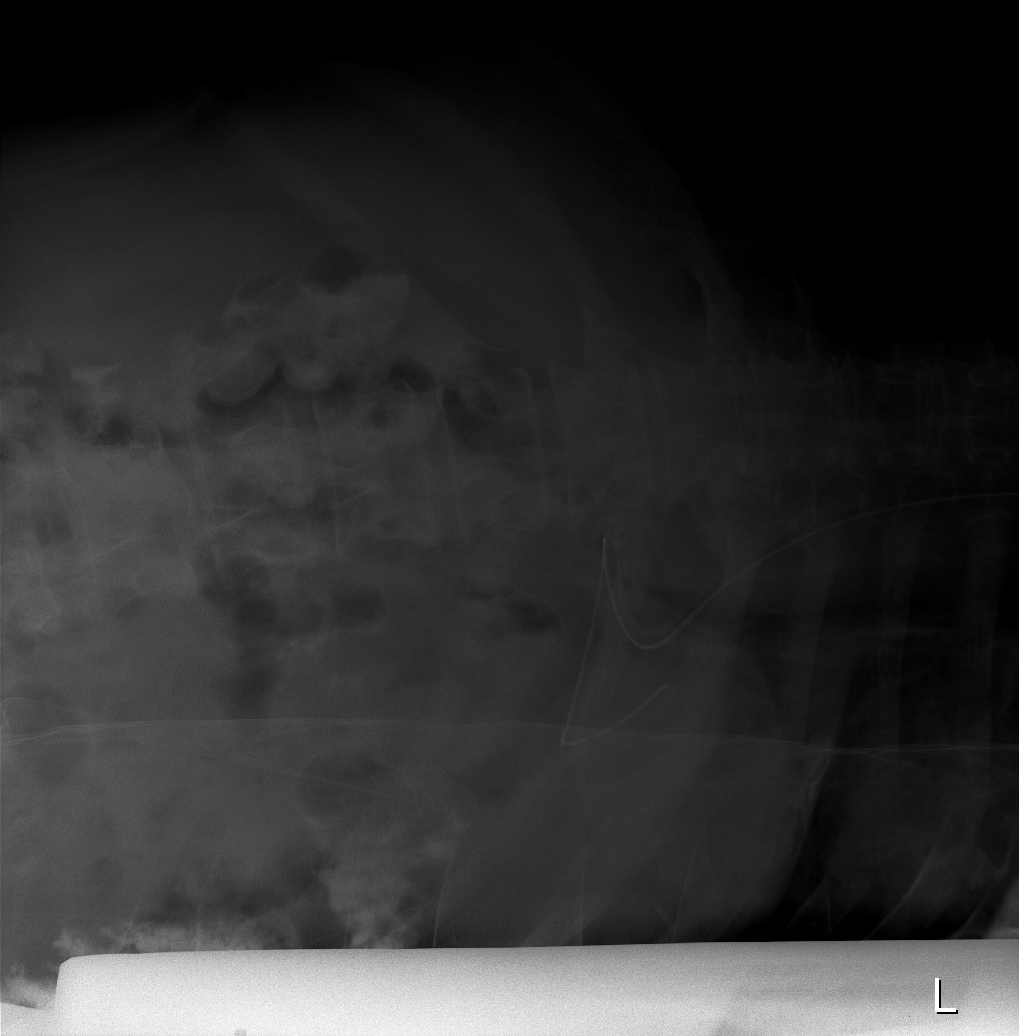

[w abdomen decub]
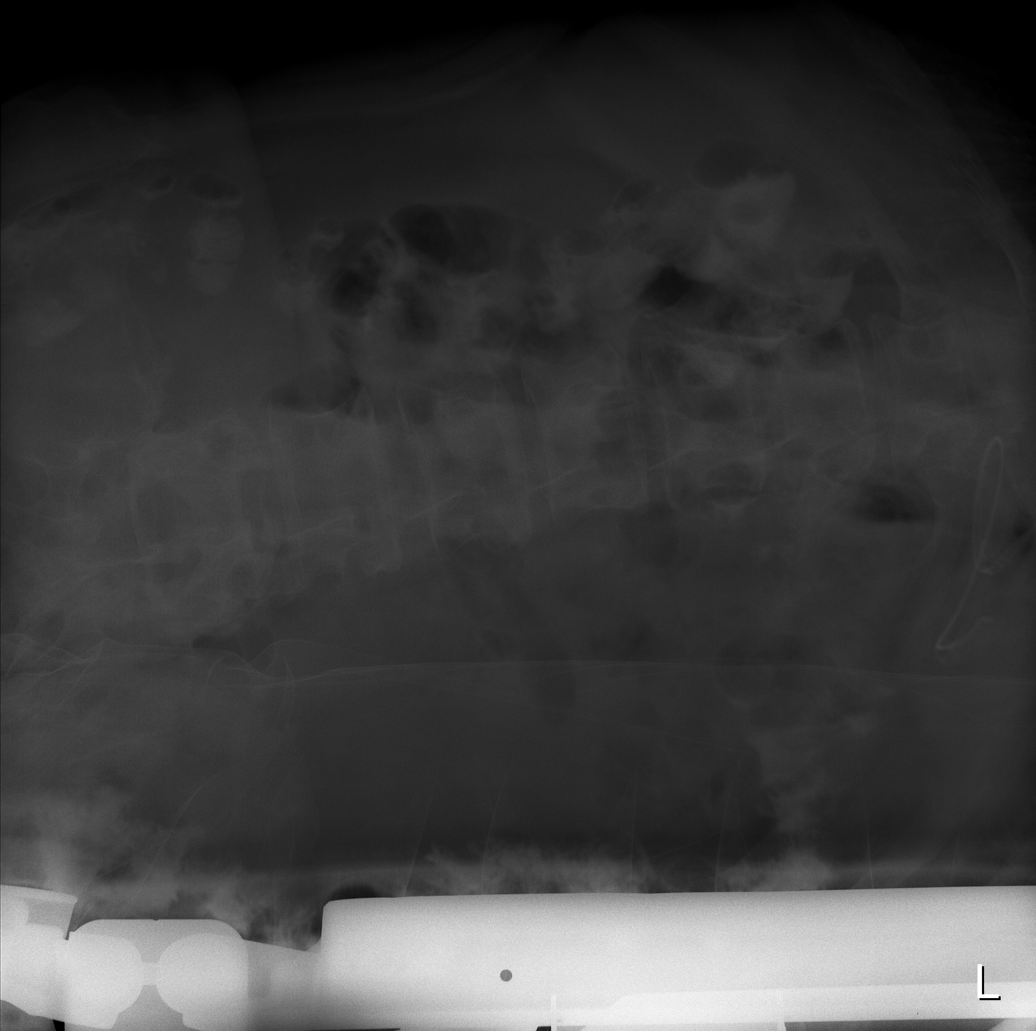

[view not recorded (1 of 2)]
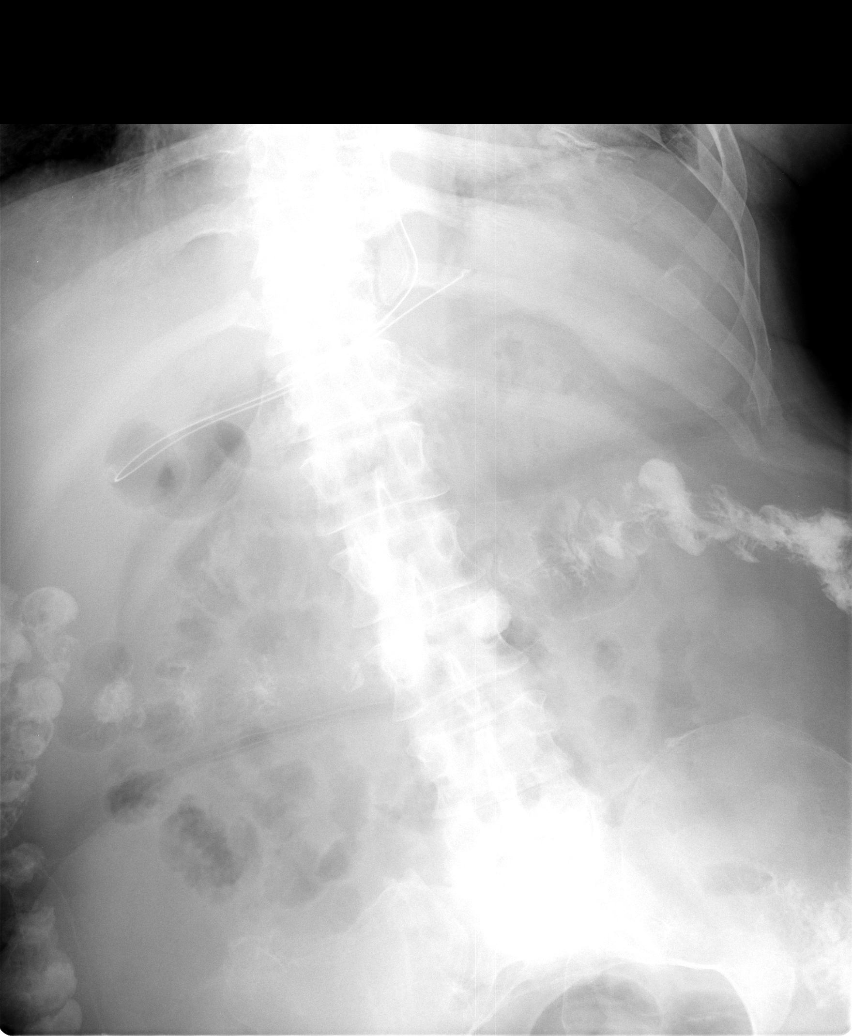

[view not recorded (2 of 2)]
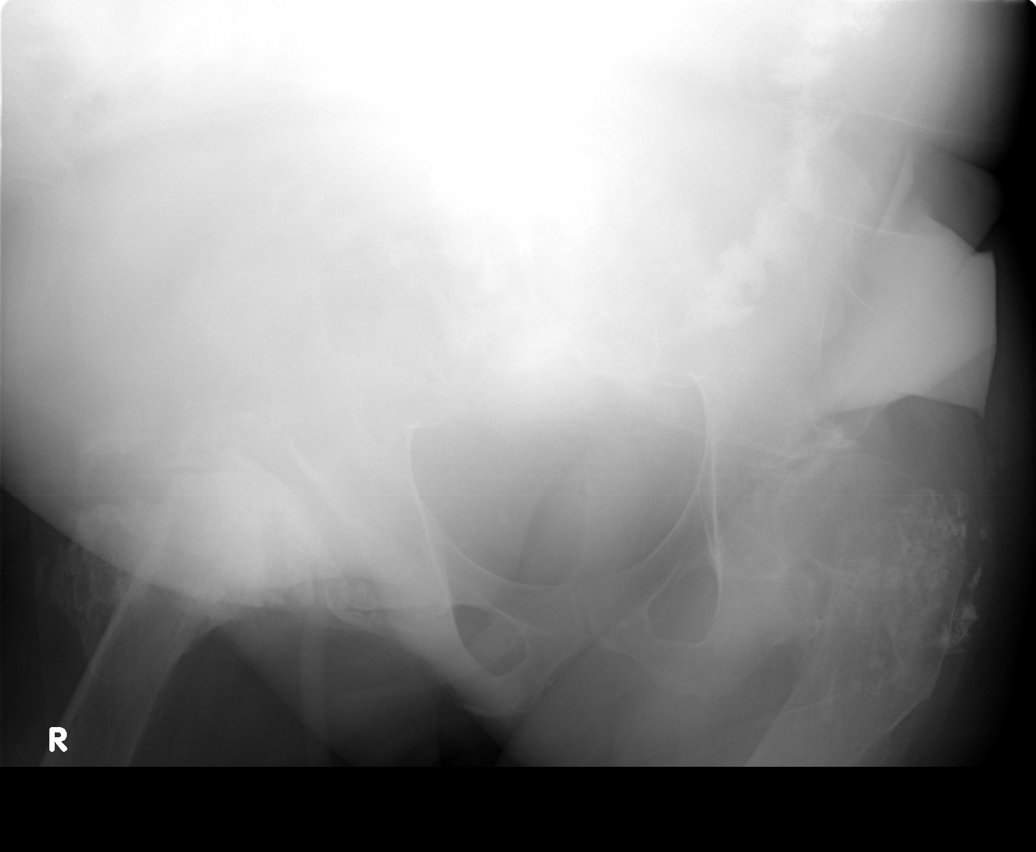

[4 of 4 positions shown; findings below may reference images not displayed]

FINDINGS: Flat and decubitus views are compared to 06/19/06.  The stomach has been decreased with an NG tube which is folded upon itself.  The tip is in the proximal stomach.  The bend in the tube is in the distal stomach. 
 There appears to be no free air on the decubitus views.  There is mild dilatation of the small bowel in general but nothing to suggest a significant bowel obstruction.
IMPRESSION: 1.  Gastric decompression with NG tube in place. 
 2.  Mild nonspecific small bowel dilatation.  No evidence for obstruction.

## 2009-02-28 IMAGING — CR DG ABDOMEN ACUTE W/ 1V CHEST
6 series · 6 of 6 positions shown · non-contrast
Comparison: 06/19/06 CT and 06/19/06 acute abdominal series.

CLINICAL DATA: Abdominal pain
ACUTE ABDOMINAL SERIES ? 6 VIEW:

[t abdomen supine * (1 of 2)]
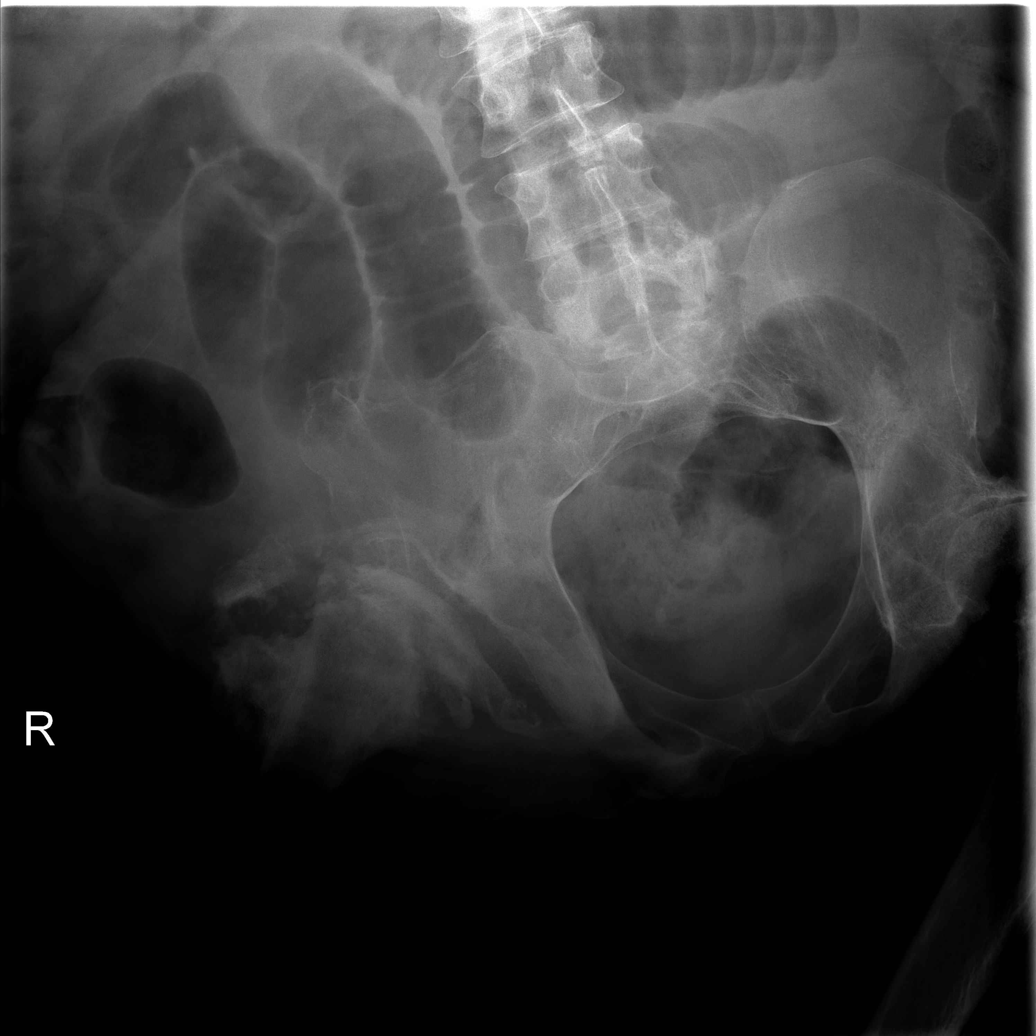

[t abdomen supine * (2 of 2)]
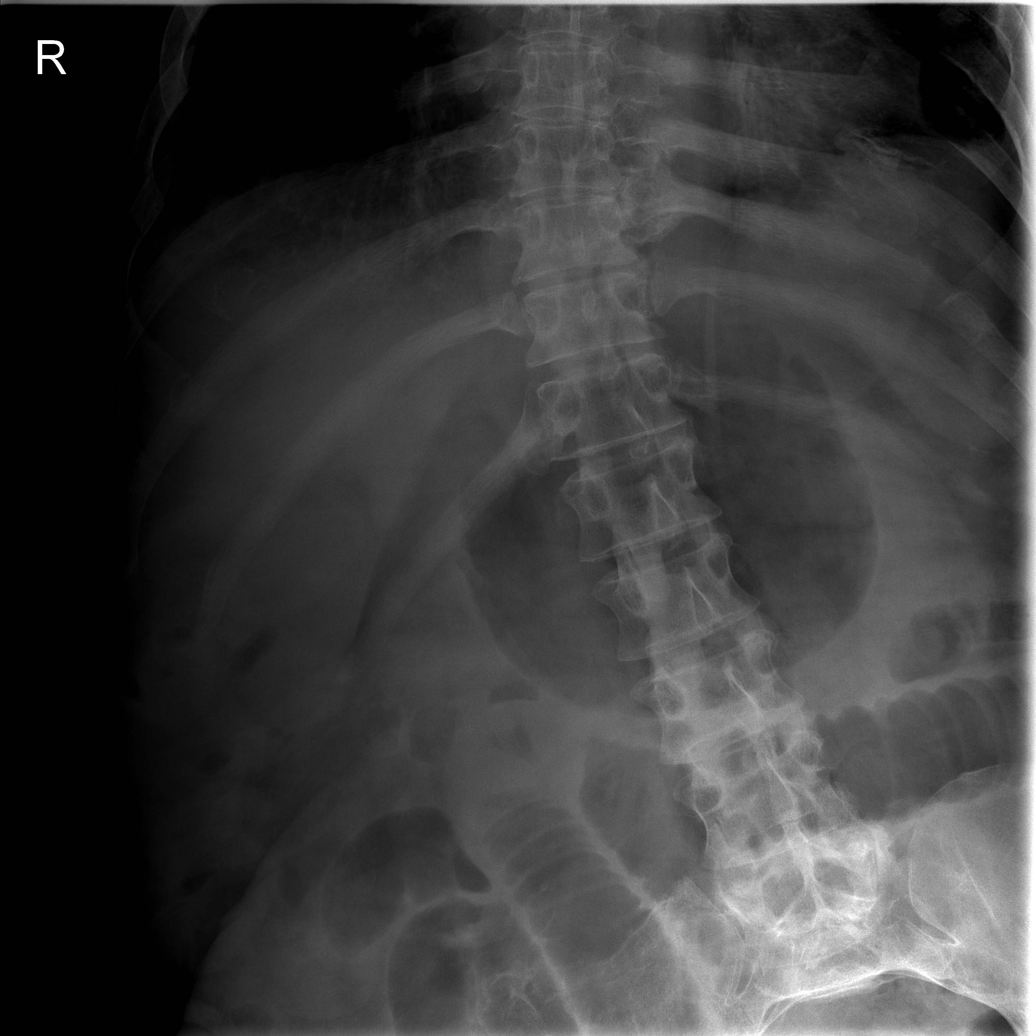

[t abdomen supine]
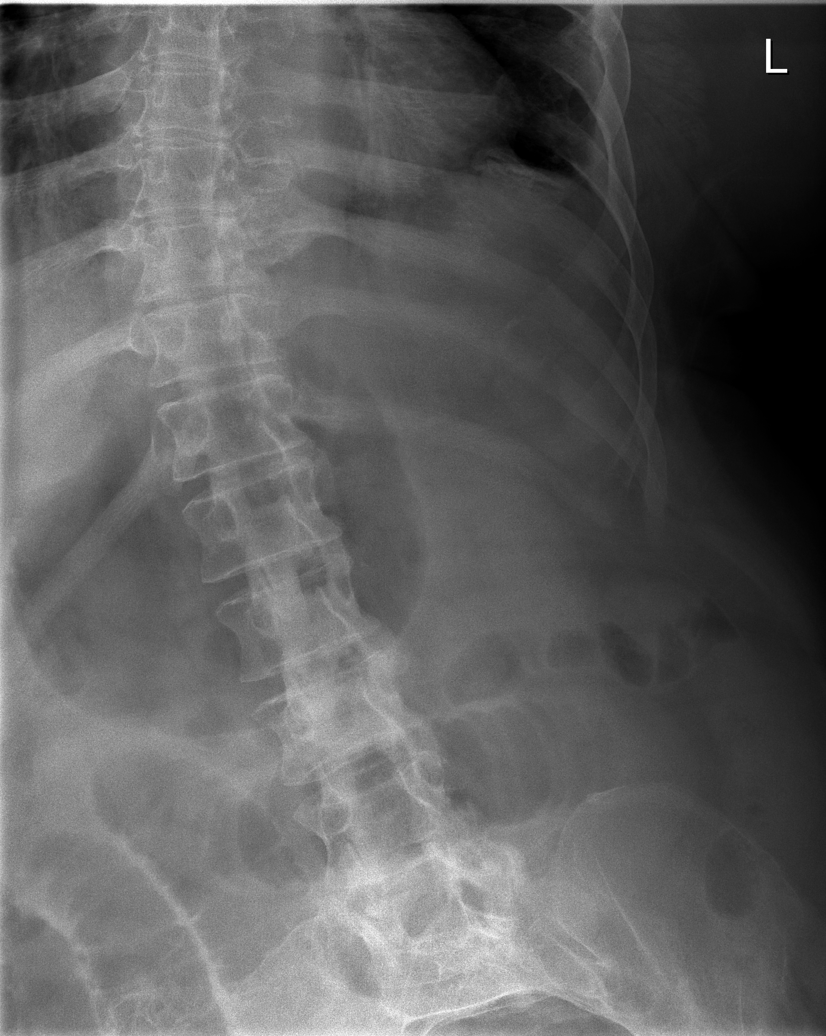

[view not recorded (1 of 3)]
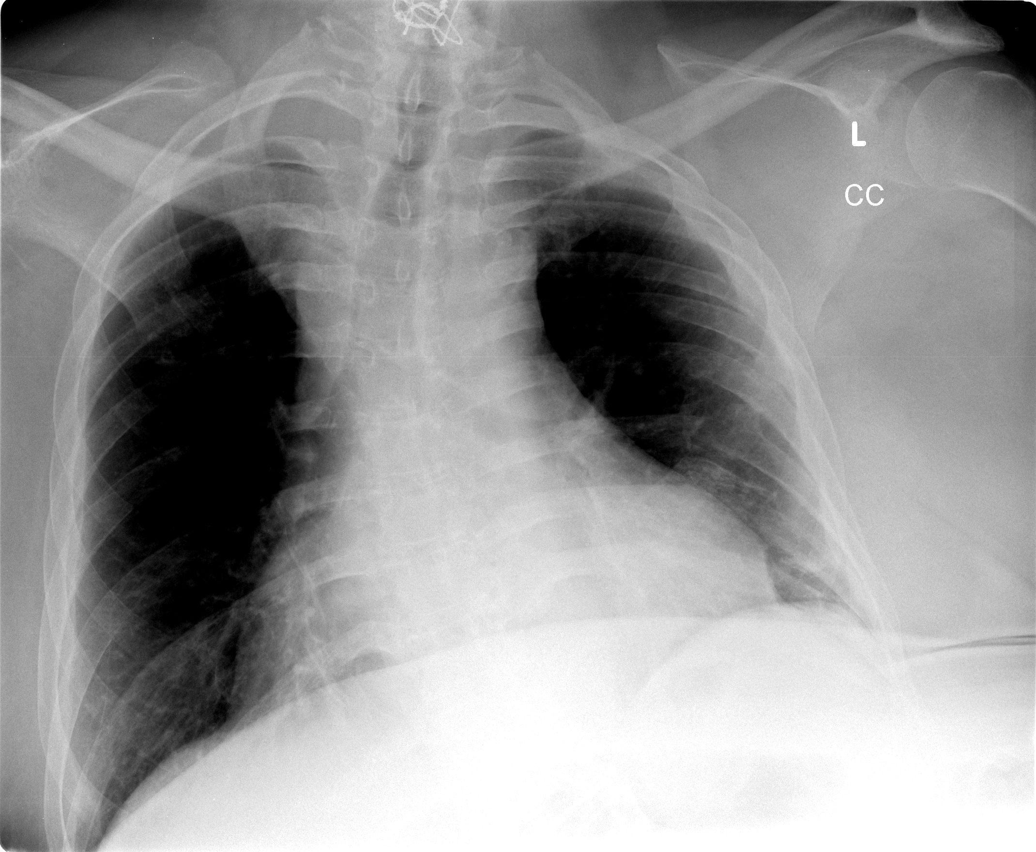

[view not recorded (2 of 3)]
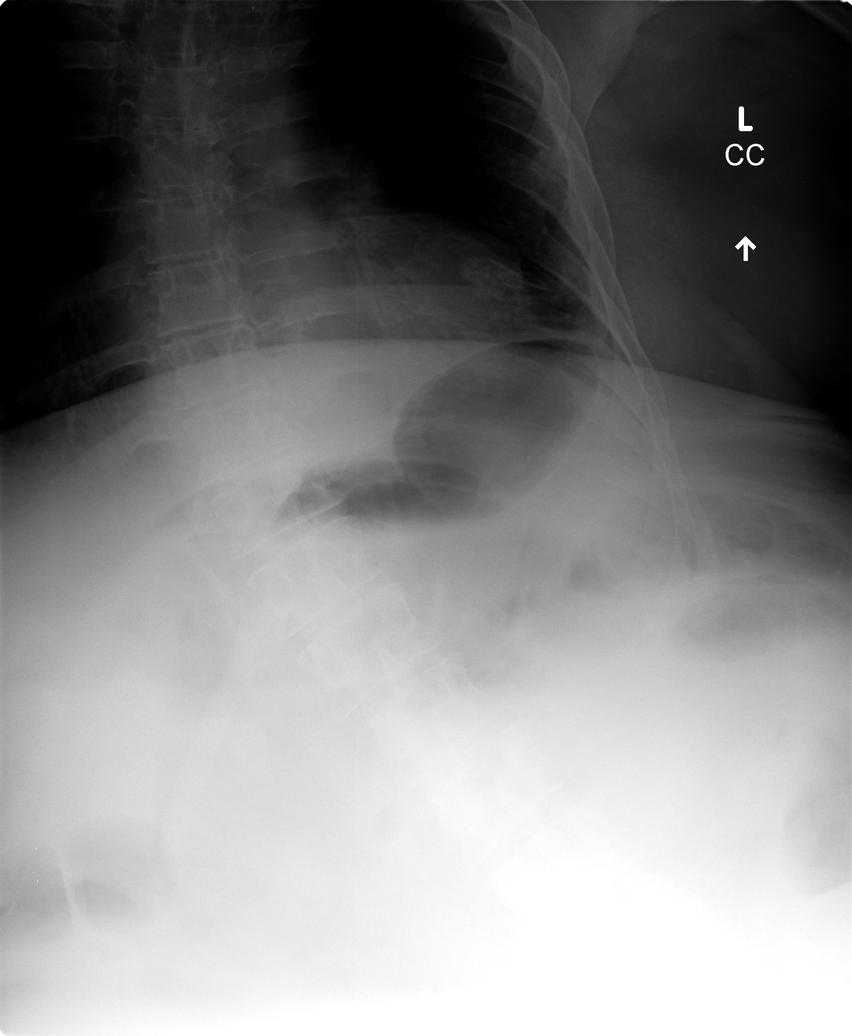

[view not recorded (3 of 3)]
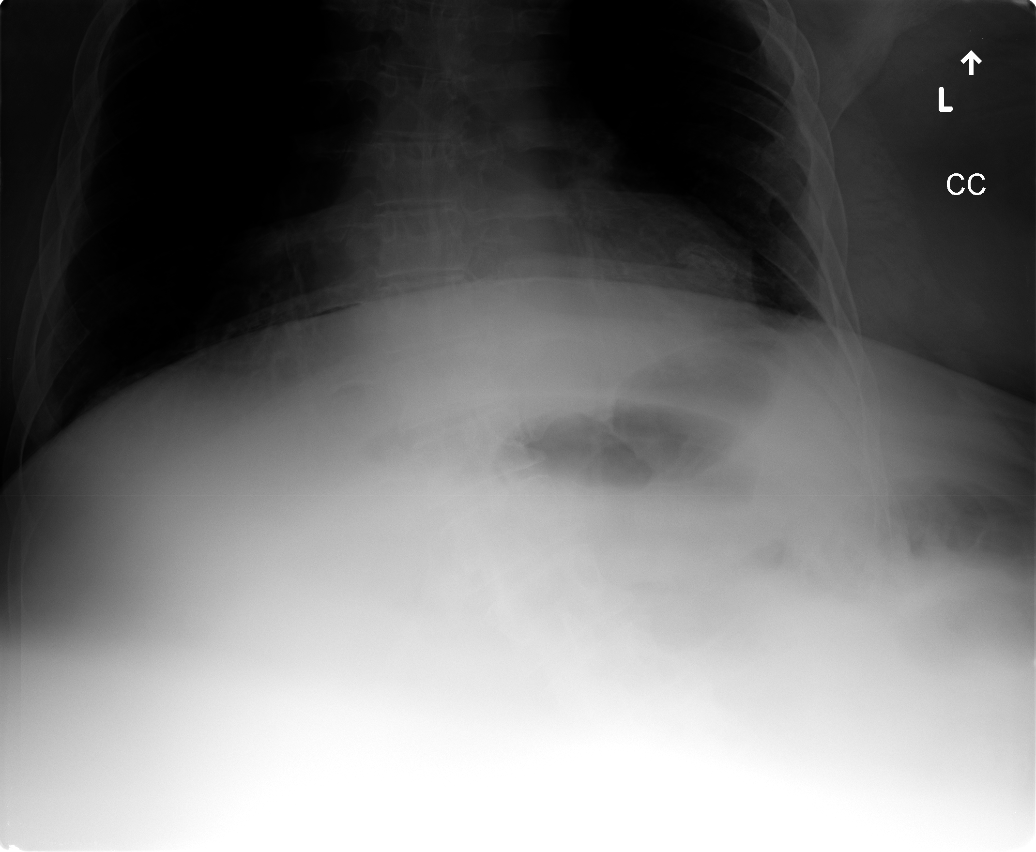

[6 of 6 positions shown; findings below may reference images not displayed]

FINDINGS: Stable marked cardiomegaly with bibasilar atelectasis versus scarring.  Apical pleural thickening is noted as before.  
In the abdomen, the stomach and small bowel are moderately distended.  The colon is relatively decompressed.  Distal obstruction is not excluded.  No definite free air within the limits of the study.  Chronic neuropathic heterotrophic changes are present in both hips.
IMPRESSION: 1.  Stable cardiomegaly and bibasilar scarring versus atelectasis. 
2.  Distended stomach and small bowel suspicious for small bowel obstruction within the limits of the study.  No gross free air. 
3.  Chronic hypertrophic and neuropathic changes of the hips.

## 2009-03-01 IMAGING — CT CT ABDOMEN WO/W CM
2 of 6 series · 14 of 46 positions shown, 16 images · IV contrast (omnipaque)
Comparison: 06/19/06.

CLINICAL DATA: Abdominal and pelvic pain, distention, nausea and vomiting.  Quadriplegic.  History of prior bowel obstructions.  
 ABDOMEN CT WITHOUT AND WITH CONTRAST:
TECHNIQUE: Multidetector CT imaging of the abdomen was performed both before and during bolus administration of intravenous contrast.
 Contrast:  100 cc Omnipaque 300 and oral contrast.  
 Please note that this study was specifically ordered by the clinician as CT of the abdomen and did not include pelvis.

[Series 2: abd w/o 5.0 b40f st · axial · non-contrast · 0.78mm/px · z∈[-404,-54]mm · 11 of 84 slices shown, 13 images]
[im 7/84  soft-tissue]
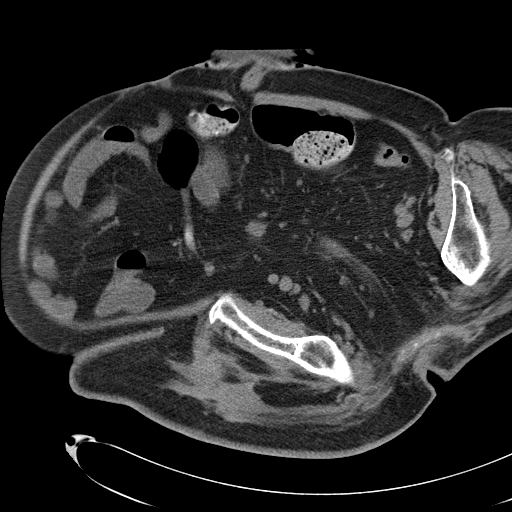
[im 7/84  bone]
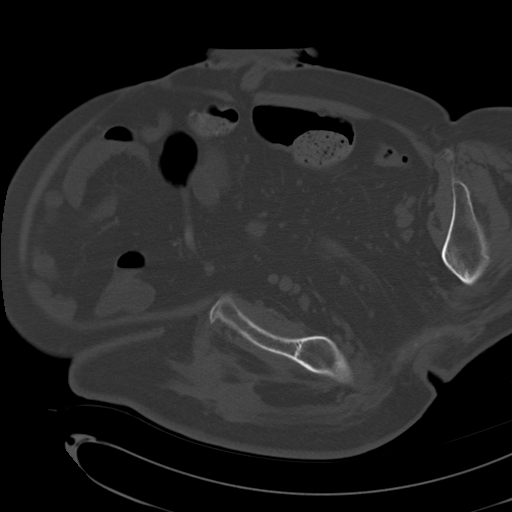
[im 14/84  soft-tissue]
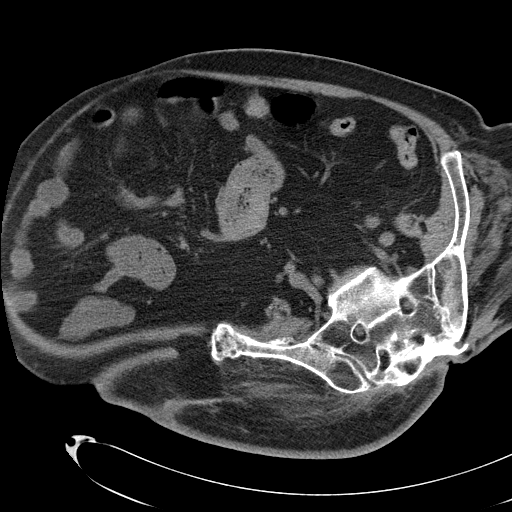
[im 21/84  soft-tissue]
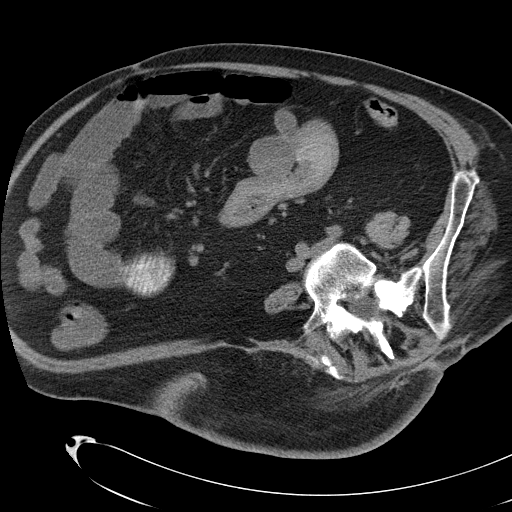
[im 28/84  soft-tissue]
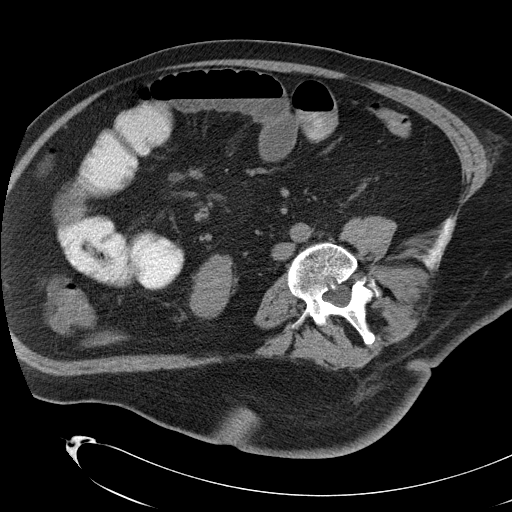
[im 35/84  soft-tissue]
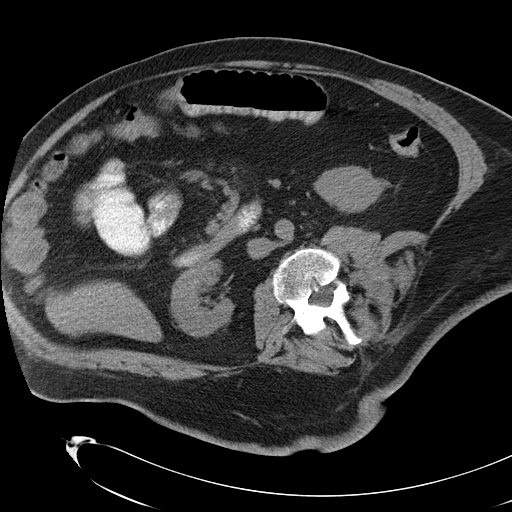
[im 42/84  soft-tissue]
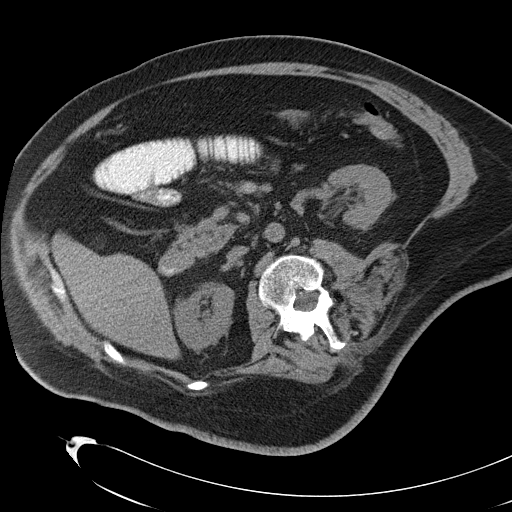
[im 49/84  soft-tissue]
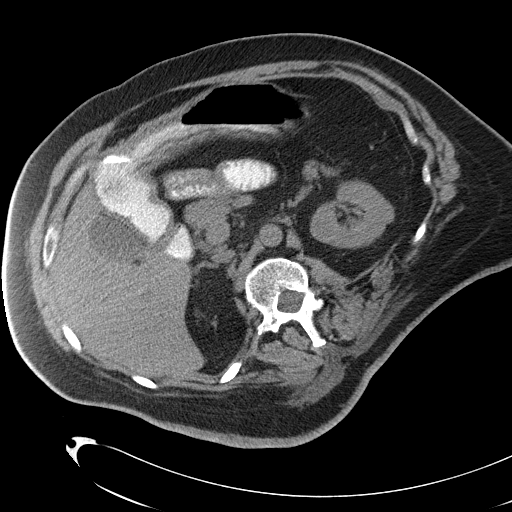
[im 56/84  soft-tissue]
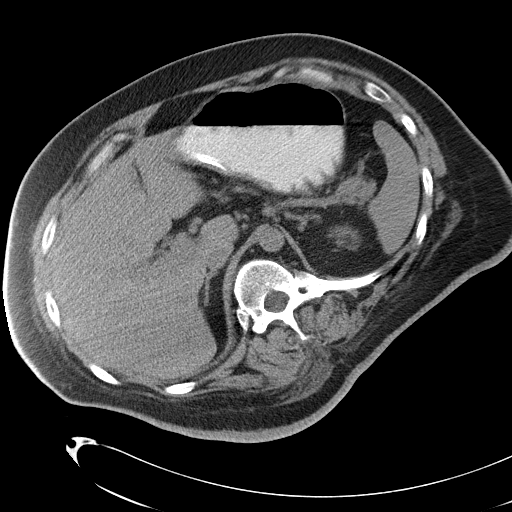
[im 63/84  soft-tissue]
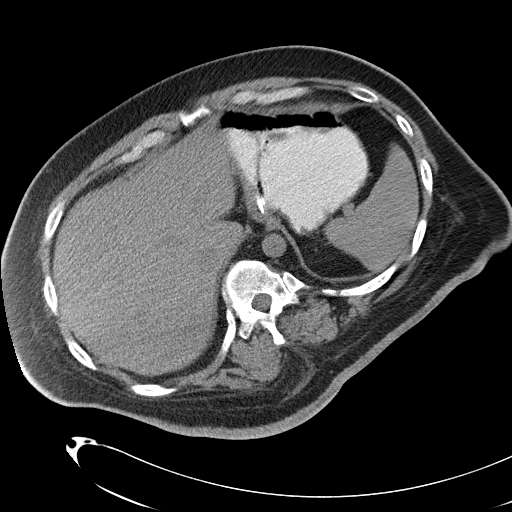
[im 63/84  bone]
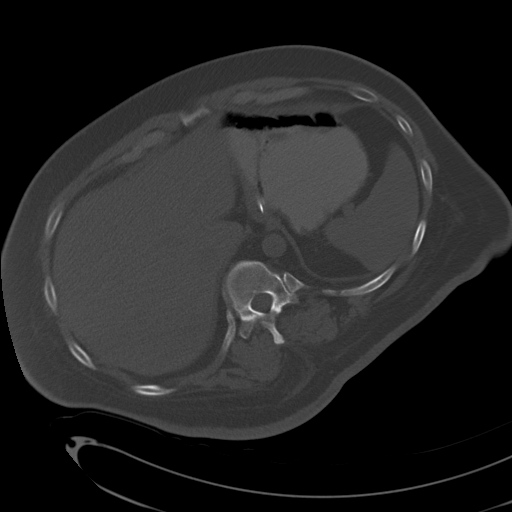
[im 70/84  soft-tissue]
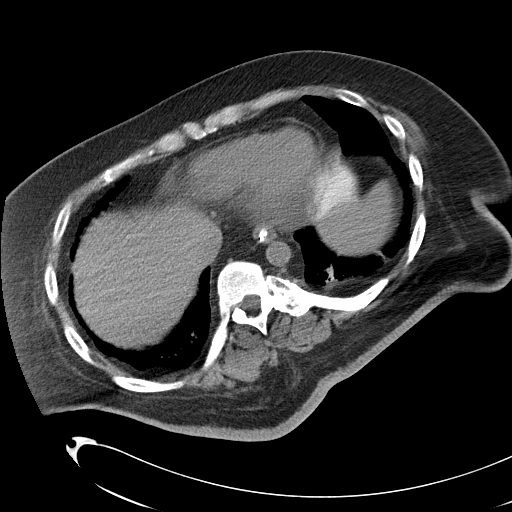
[im 77/84  soft-tissue]
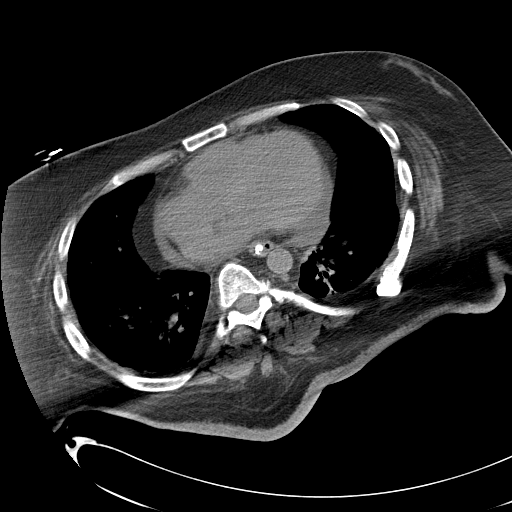

[Series 602: <mpr range> · coronal · 0.78mm/px · 3 of 94 slices shown]
[im 32/94  soft-tissue]
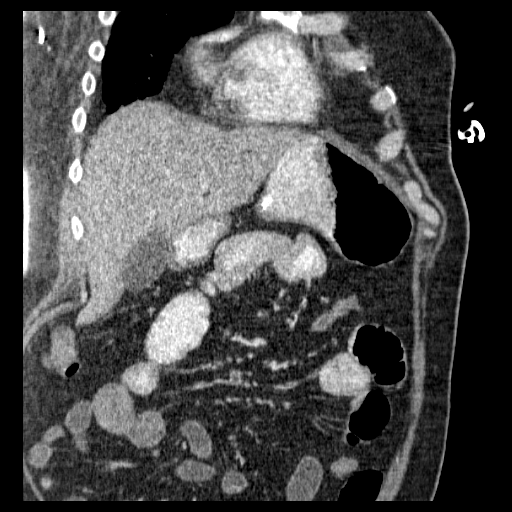
[im 42/94  soft-tissue]
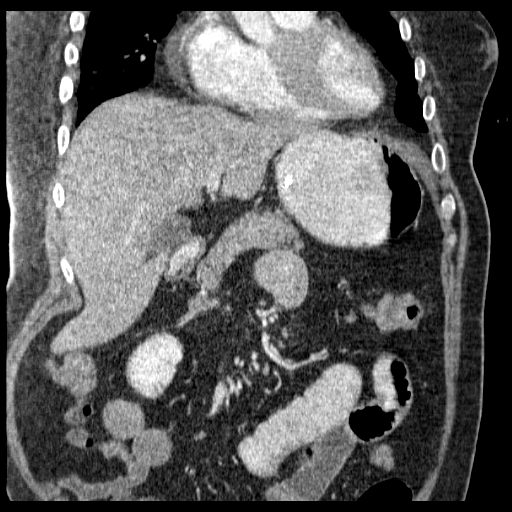
[im 52/94  soft-tissue]
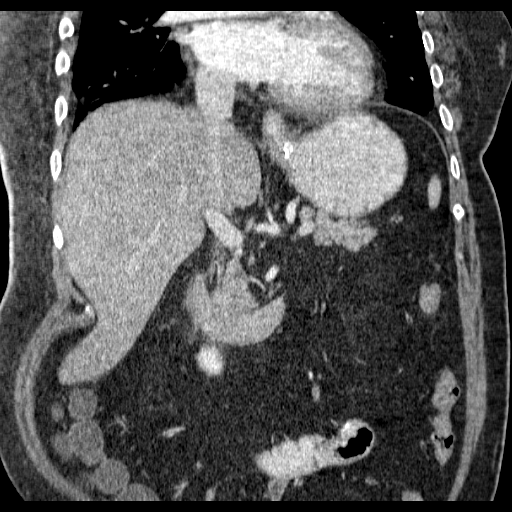

[14 of 46 positions shown; findings below may reference images not displayed]

FINDINGS: Bibasilar atelectasis and cardiomegaly again noted.  An NG tube is present within the stomach.  There are dilated proximal mid small bowel loops with collapsed distal small bowel loops compatible with small bowel obstruction.  I do not definitely identify a transition point.  There is no evidence of free air or free fluid.  The liver, gallbladder, spleen, kidneys, adrenal glands and pancreas are unremarkable.  No evidence of abdominal aortic aneurysm or biliary dilatation.  Deformities within the hips are again noted.   A left lower quadrant ostomy is noted.
IMPRESSION: 1.  Small bowel obstruction ? transition point not identified.  No free fluid or free air. 
 2.  Cardiomegaly and basilar atelectasis.

## 2009-03-02 IMAGING — CR DG ABD PORTABLE 2V
2 series · 2 of 2 positions shown · non-contrast
Comparison: none

CLINICAL DATA: Abdominal distention.  Small bowel obstruction.  
 PORTABLE ABDOMEN ? 2 VIEW:

[view not recorded (1 of 2)]
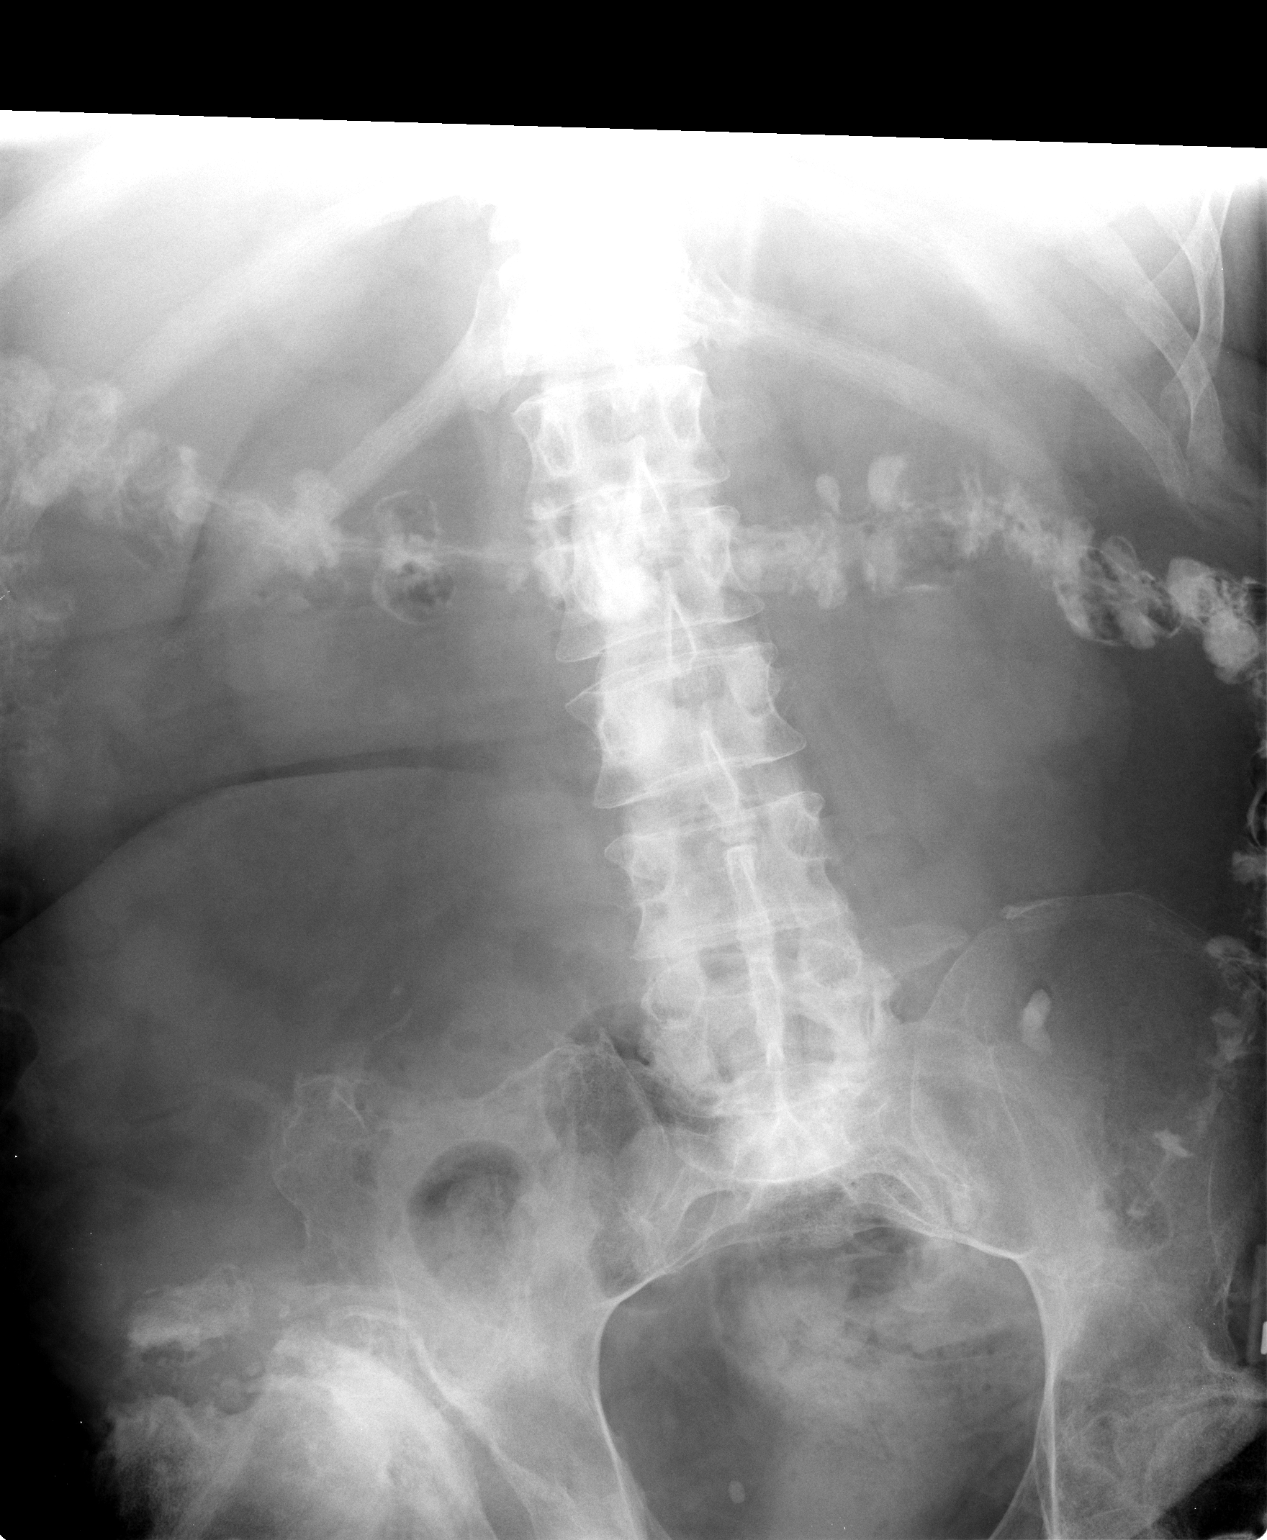

[view not recorded (2 of 2)]
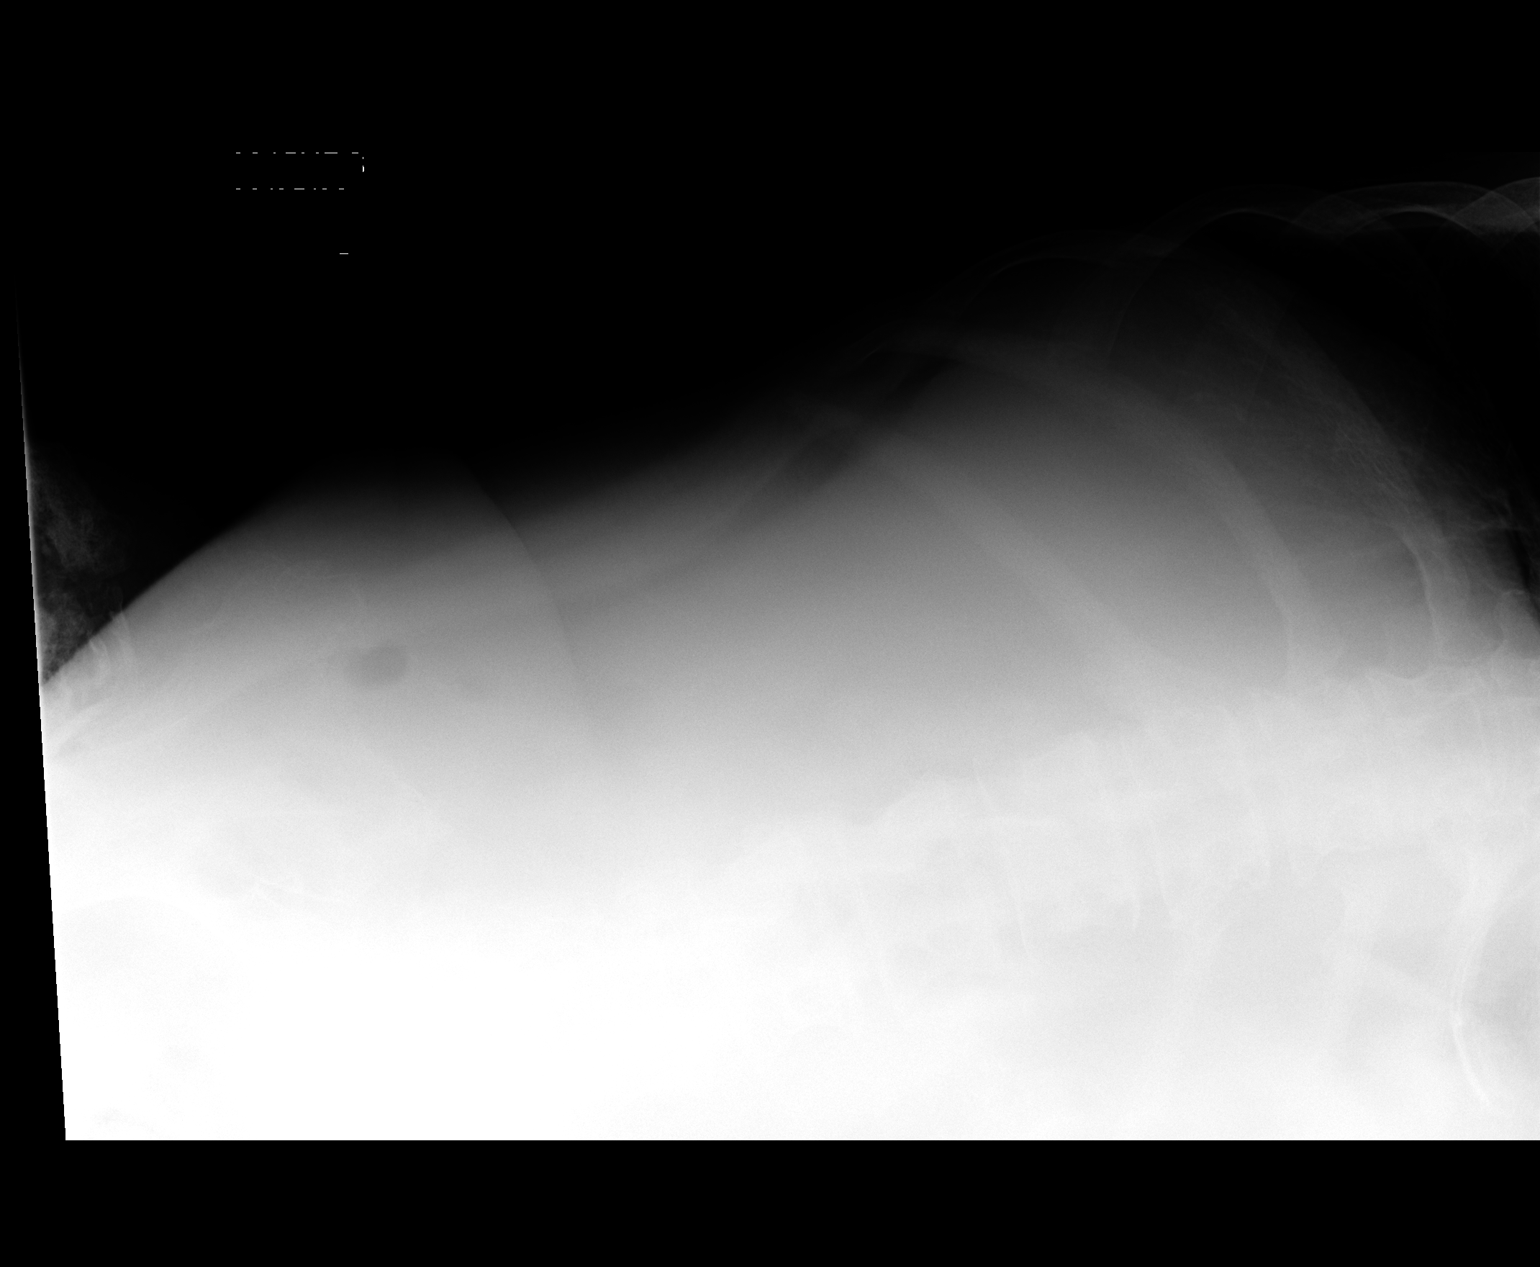

[2 of 2 positions shown; findings below may reference images not displayed]

FINDINGS: Residual Gastrografin in the colon secondary to yesterday?s CT scan.  Ostomy site left pelvis.  There are a couple of air distended loops of bowel in the right lower quadrant, probably representing small bowel.  I suspect the bowel gas pattern may be improved when compared to yesterday?s CT.
IMPRESSION: There are a couple loops of slightly air distended bowel in the right lower quadrant presumably representing small bowel.  I suspect that the bowel gas pattern may be improved.  No colonic distention.

## 2009-03-03 IMAGING — CR DG ABD PORTABLE 1V
2 series · 2 of 2 positions shown · non-contrast
Comparison: none

CLINICAL DATA: Small bowel obstruction.  
ABDOMEN ? 1 VIEW:

[view not recorded (1 of 2)]
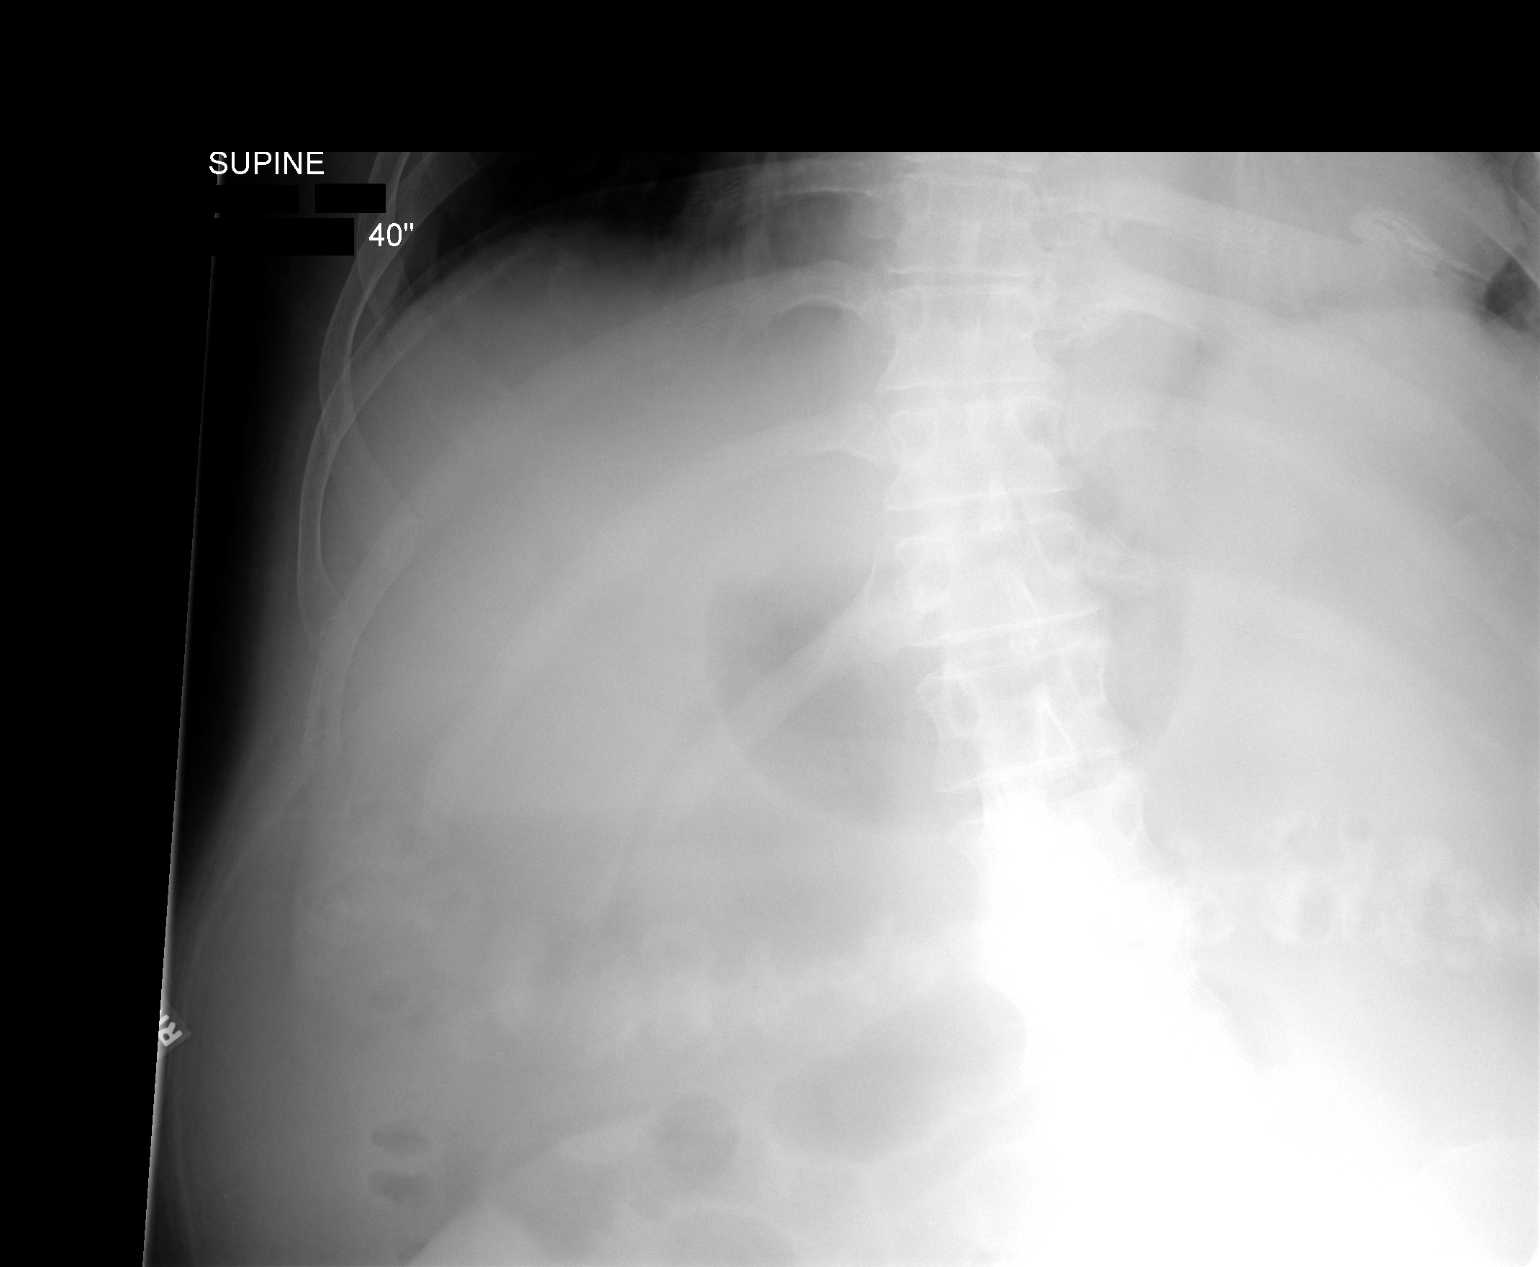

[view not recorded (2 of 2)]
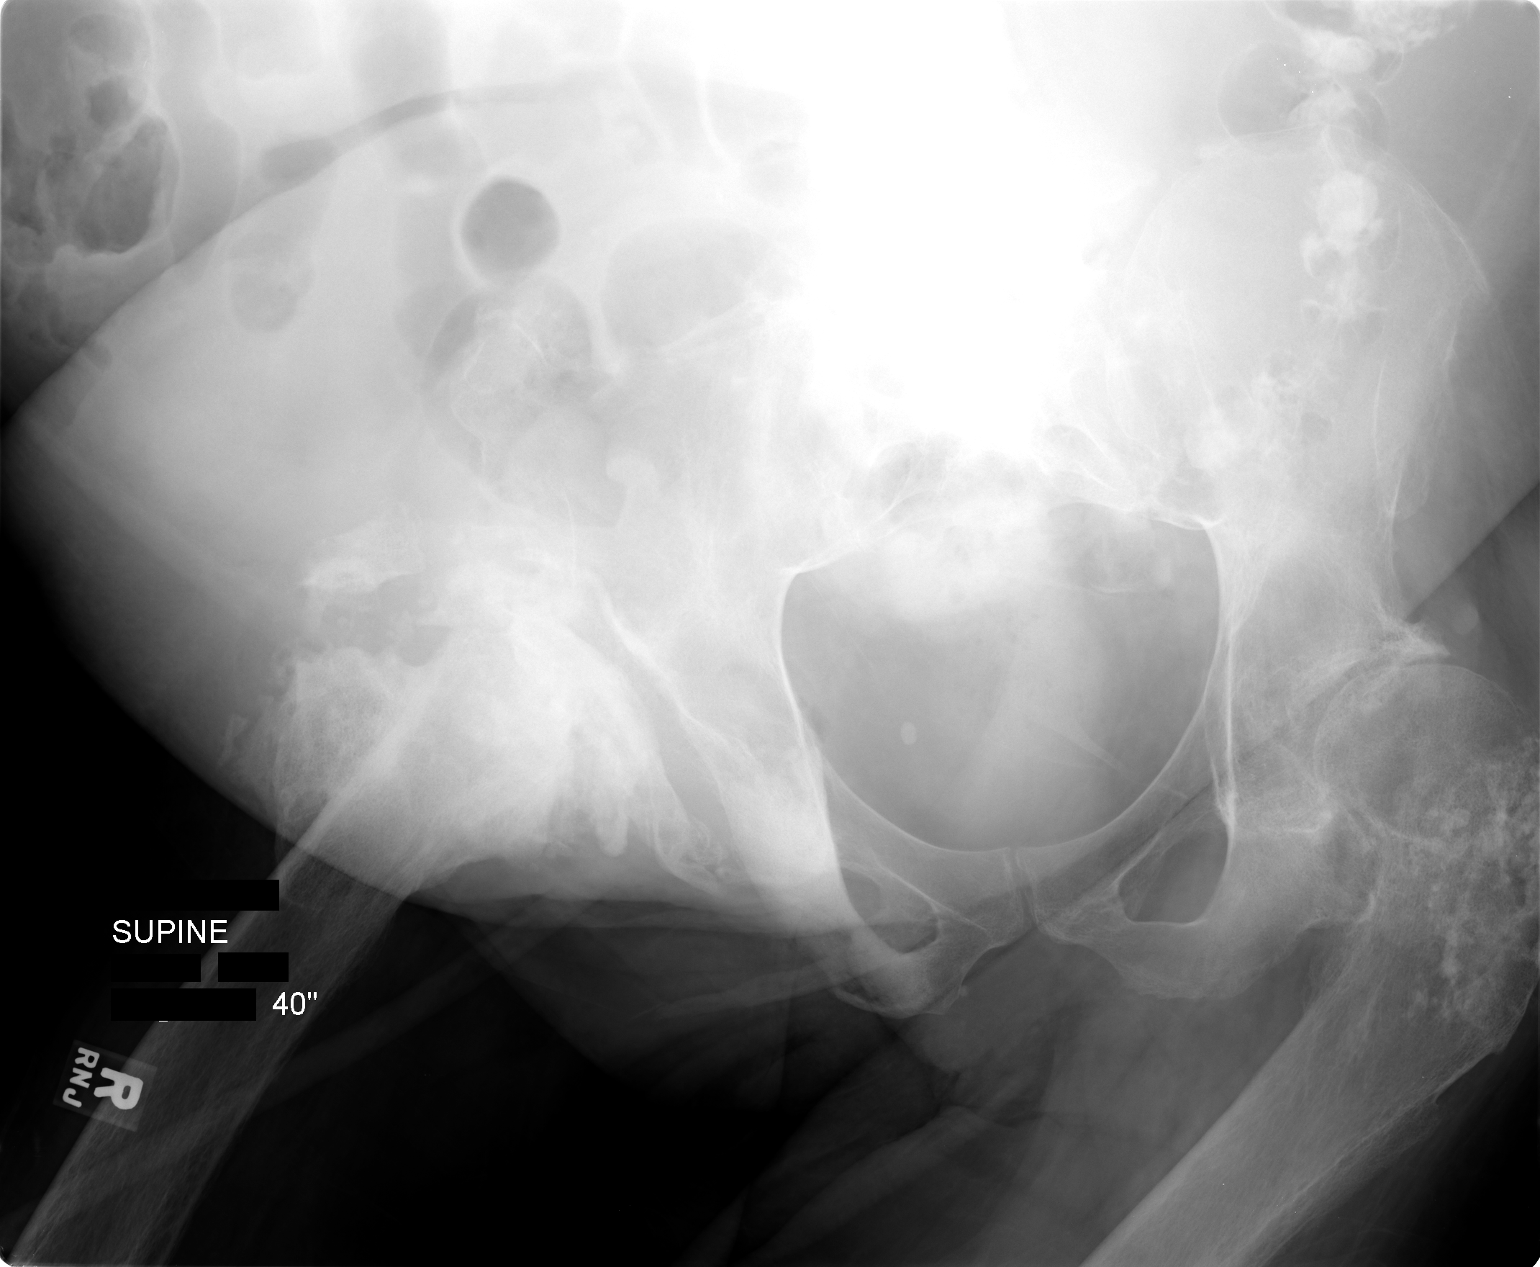

[2 of 2 positions shown; findings below may reference images not displayed]

FINDINGS: Supine view of the upper abdomen is somewhat limited by patient body habitus.  Contrast is seen within the colon and gas is seen within the stomach in nondilated small bowel loops.  On the second view the imaging of the pelvis and lower abdomen demonstrates gas and contrast within nondistended colon without significant small bowel distention.  
Chronic dislocation of the right hip incidentally noted.
IMPRESSION: Further improvement in small bowel obstruction.

## 2009-03-08 IMAGING — CR DG CHEST 1V PORT
1 series · 1 of 1 positions shown · non-contrast
Comparison: Portable chest x-ray 06/19/06 and 06/12/05.

CLINICAL DATA: Shortness of breath.  
 PORTABLE CHEST ? 1 VIEW ([DATE] HOURS):

[view not recorded]
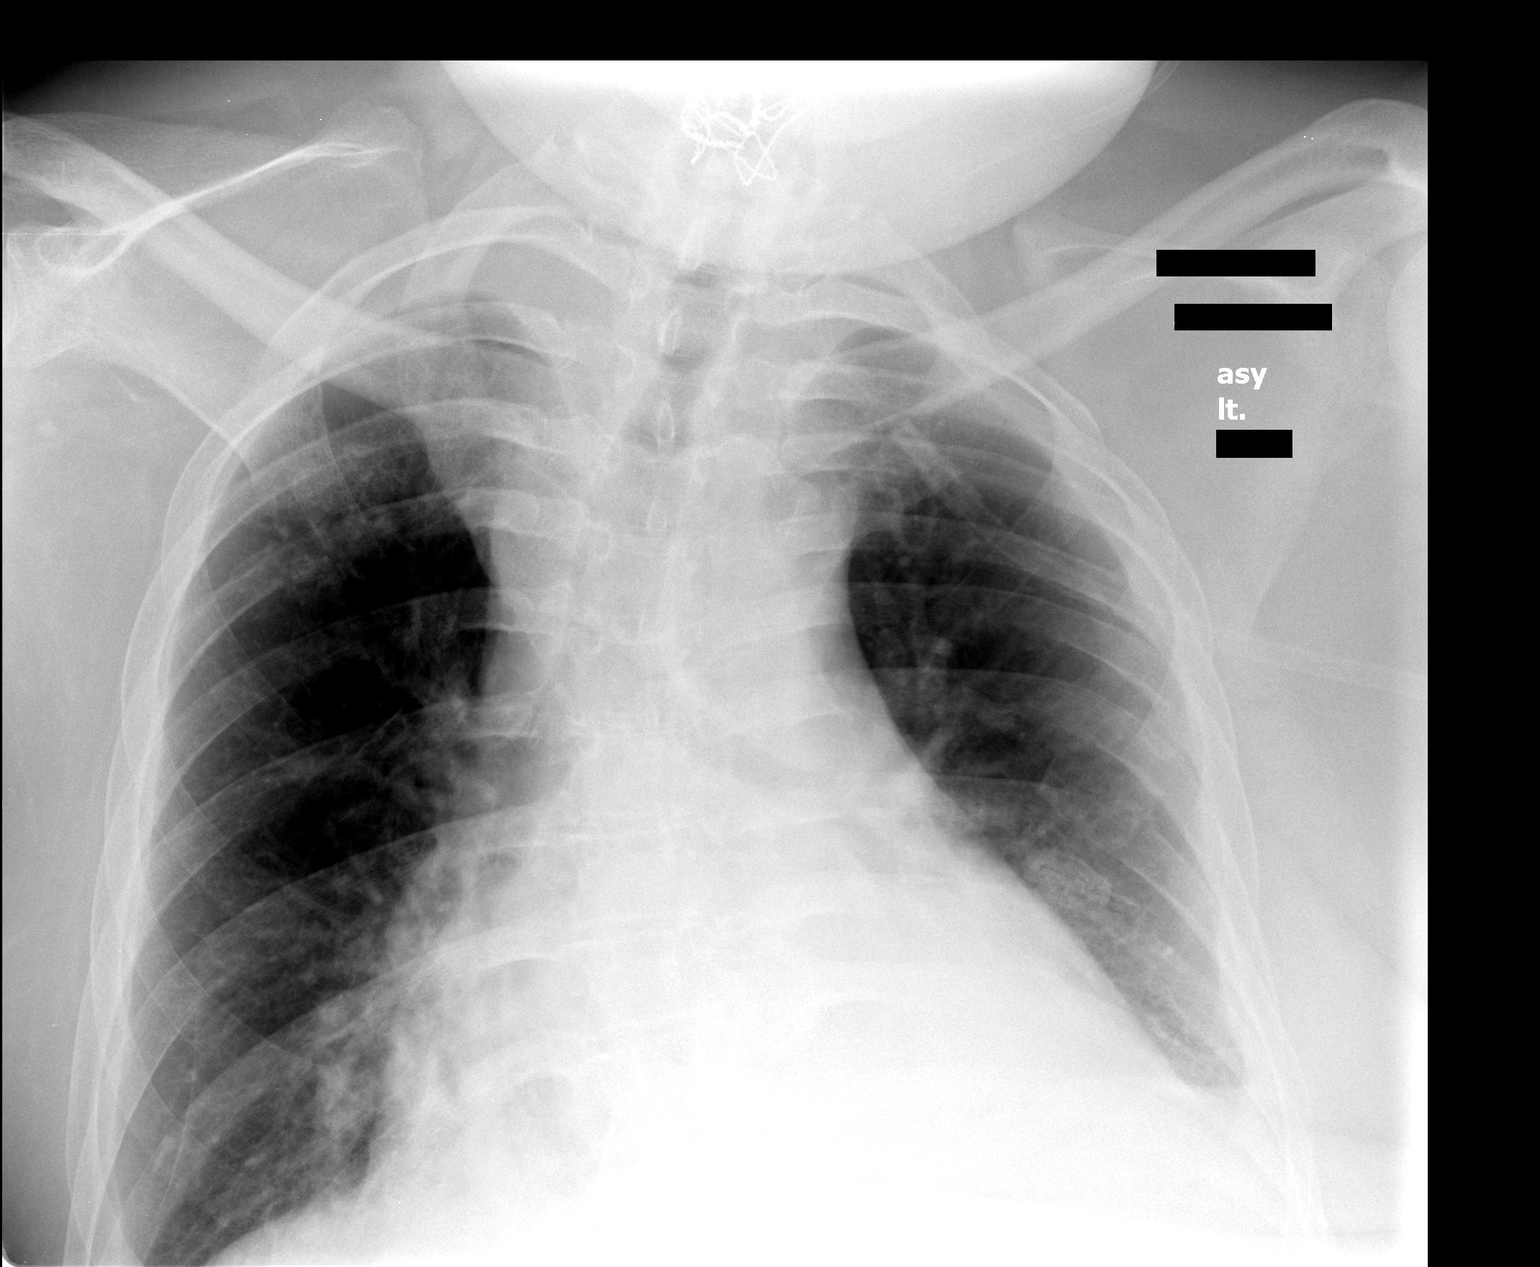

[1 of 1 positions shown; findings below may reference images not displayed]

FINDINGS: Heart enlarged but stable.  Airspace consolidation in the left lower lobe, new since the prior study.   Possible small associated left pleural effusion.  Pulmonary parenchyma otherwise clear.
IMPRESSION: Left lower lobe atelectasis and/or pneumonia with probable small left effusion. 
 Results telephoned to the Emergency Department as part of the pneumonia protocol.

## 2009-03-08 IMAGING — CR DG CHEST 1V PORT
1 series · 1 of 1 positions shown · non-contrast
Comparison: none

CLINICAL DATA: 41-year-old male, pneumonia left upper lobe.  PICC placement. 
 PORTABLE CHEST - 1 VIEW:

[view not recorded]
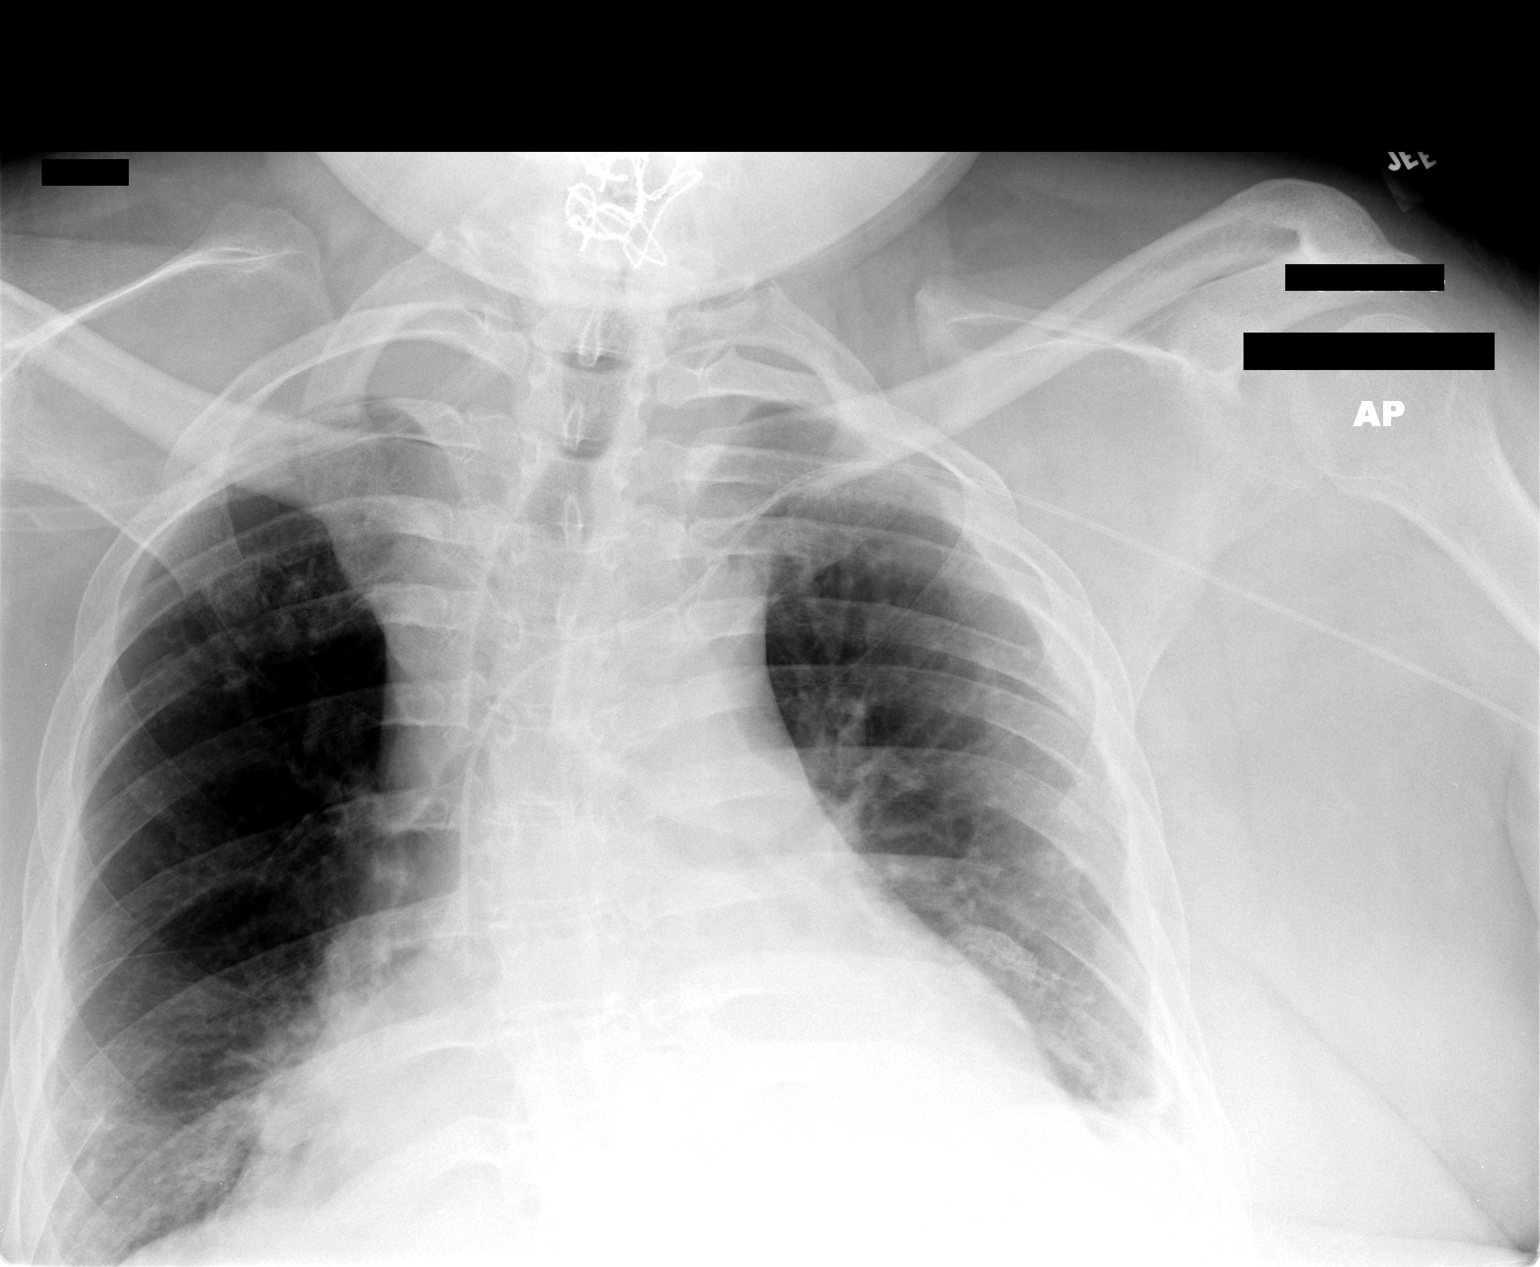

[1 of 1 positions shown; findings below may reference images not displayed]

FINDINGS: The right CP angle is not included on this film.  New left PICC line has been placed.  The tip of the catheter is at the cavoatrial junction.  Heart size remains mildly enlarged.  A small left pleural effusion is noted.  Minimal airspace disease at the right base has increased.  There remain prominent lung markings in the right upper lobe which may be chronic.  These were present on previous studies dating back to 06/19/2006.  This may represent scarring from pneumonia of [DATE].
IMPRESSION: 1.  Left PICC line terminates at the cavoatrial junction.  
 2.  Stable cardiomegaly.  
 3.  Small left pleural effusion. 
 4.  Slight increased right lower lobe airspace disease.

## 2009-05-10 IMAGING — CR DG CHEST 1V PORT
1 series · 1 of 1 positions shown · non-contrast
Comparison: 07/25/06.

CLINICAL DATA: Dyspnea, cough, and congestion.  
 PORTABLE CHEST ? 1 VIEW:

[view not recorded]
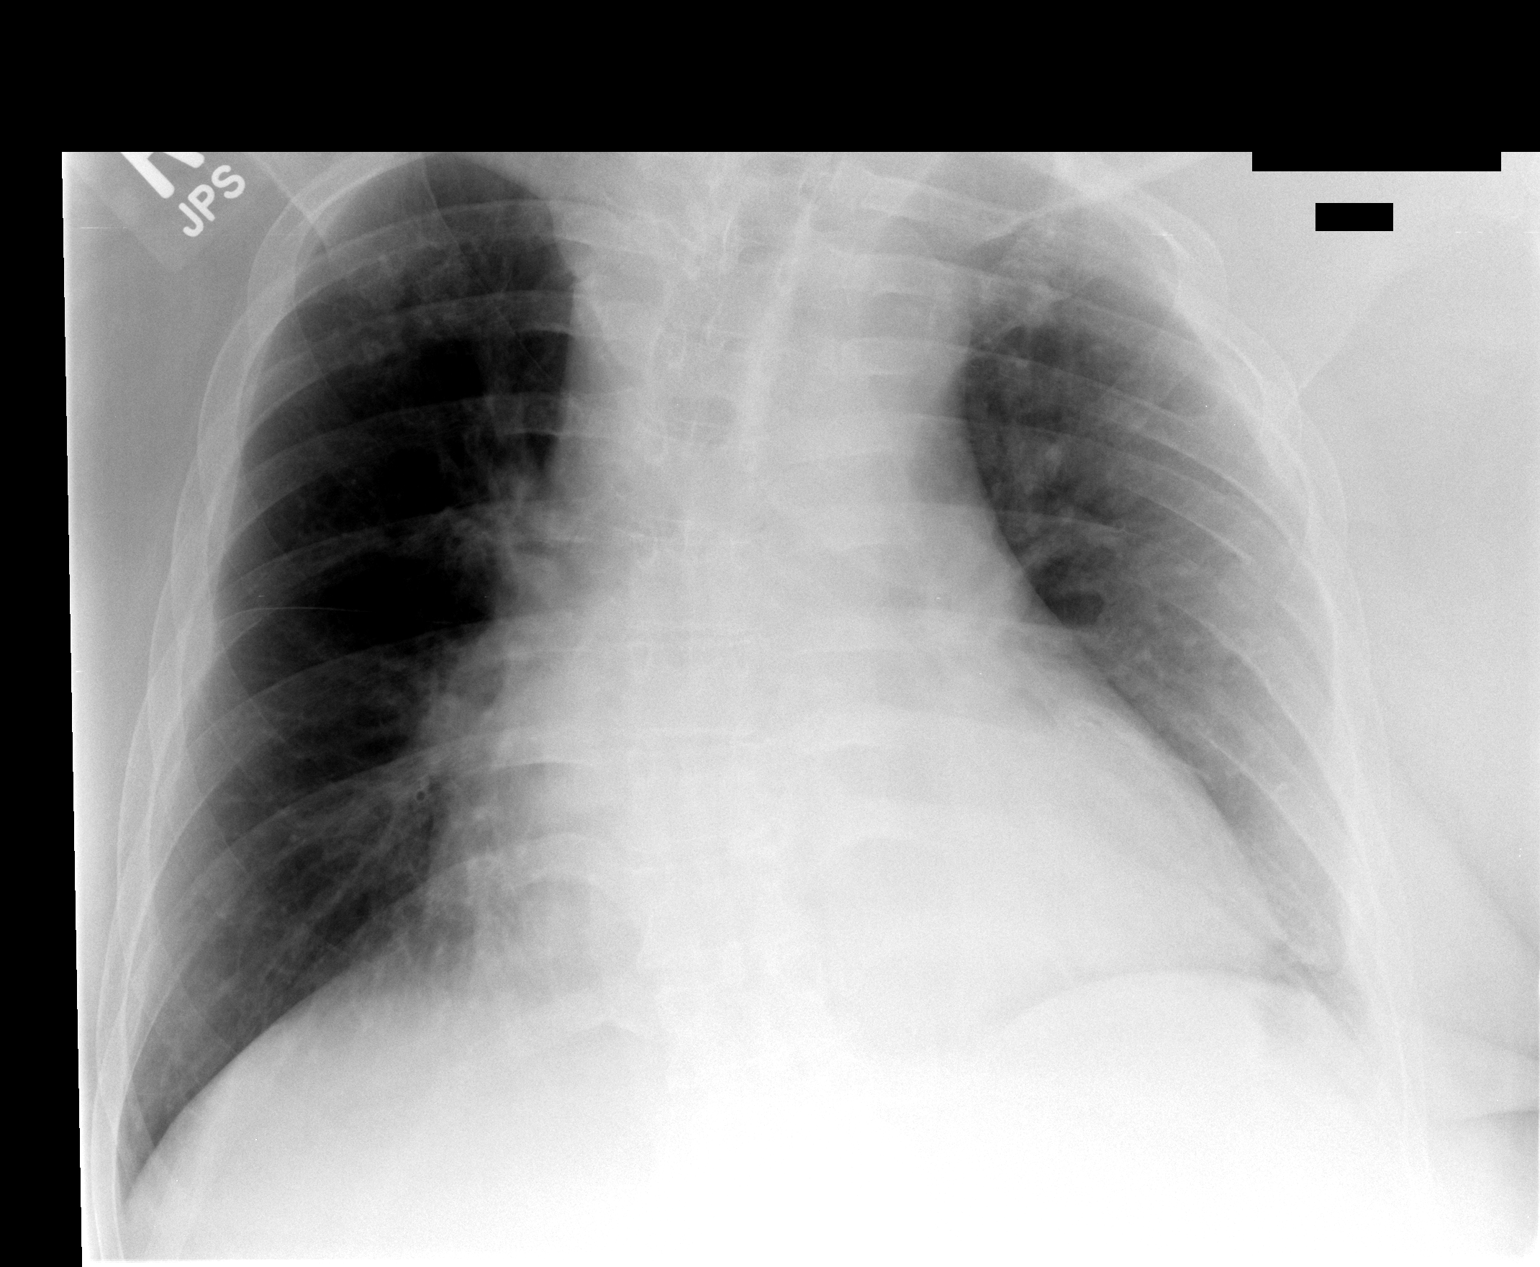

[1 of 1 positions shown; findings below may reference images not displayed]

FINDINGS: Diffuse peribronchial thickening is noted.  Some improved aeration in the lower lungs with continued atelectasis/airspace disease.  Thoracic scoliosis and cardiomegaly are unchanged.
IMPRESSION: Slightly improved aeration in the lung bases since 07/25/06 with continued mild atelectasis/scarring/airspace disease, peribronchial thickening, and cardiomegaly.

## 2009-05-15 IMAGING — CR DG CHEST 1V PORT
1 series · 1 of 1 positions shown · non-contrast
Comparison: none

HISTORY: PICC line placement, pneumonia

[view not recorded]
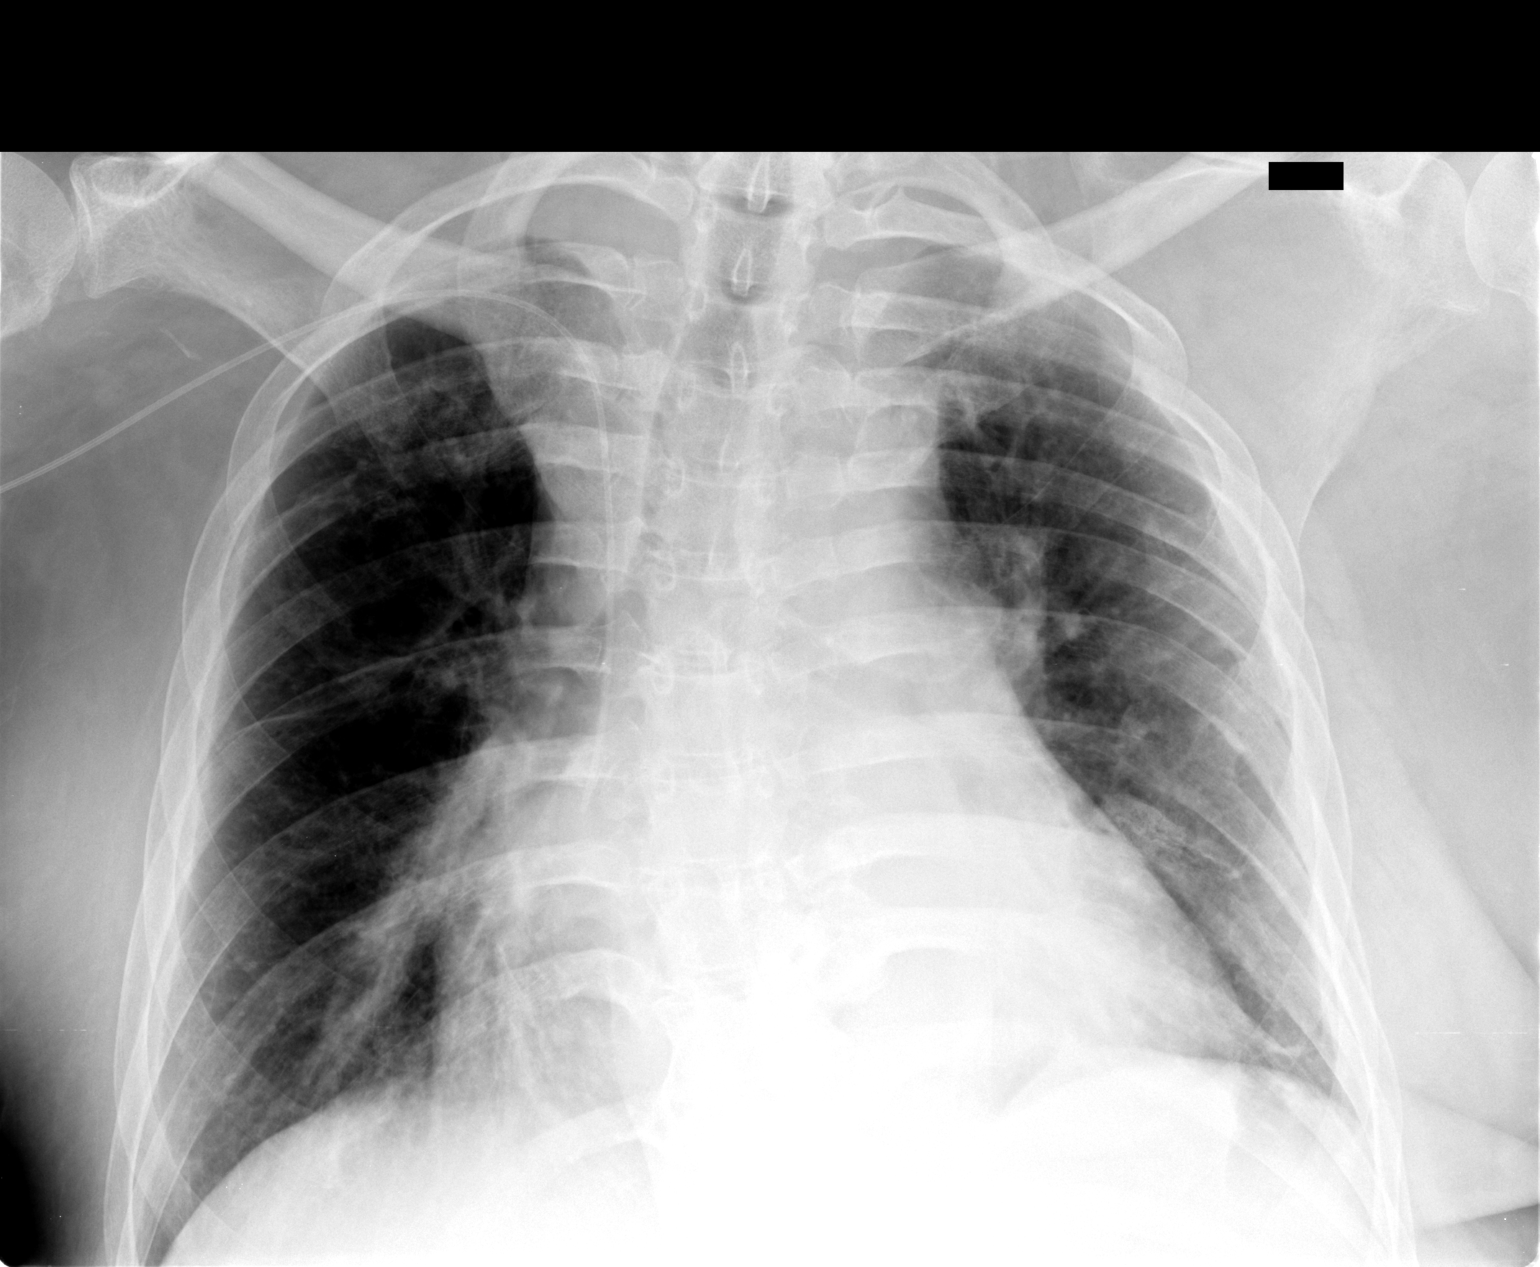

[1 of 1 positions shown; findings below may reference images not displayed]

PORTABLE CHEST ONE VIEW:

Portable exam 1311 hours compared to 09/26/2006

New right arm PICC line, tip at cavoatrial junction.
Cardiac enlargement.
Rotated exam.
Pulmonary vascularity normal.
Biapical pleural thickening stable.
Scattered densities mid to lower lungs bilaterally question atelectasis versus
developing infiltrate.
Prior cervical spine surgery.
IMPRESSION: Tip of right arm PICC line at cavoatrial junction.
Scattered parenchymal densities question atelectasis versus infiltrate.

## 2009-05-25 IMAGING — CR DG CHEST 1V
1 series · 1 of 1 positions shown · non-contrast
Comparison: 10/01/06 and comparison chest CT 10/02/06.

CLINICAL DATA: Quadriplegic.  Pneumonia and cough.
CHEST ? 1 VIEW:

[view not recorded]
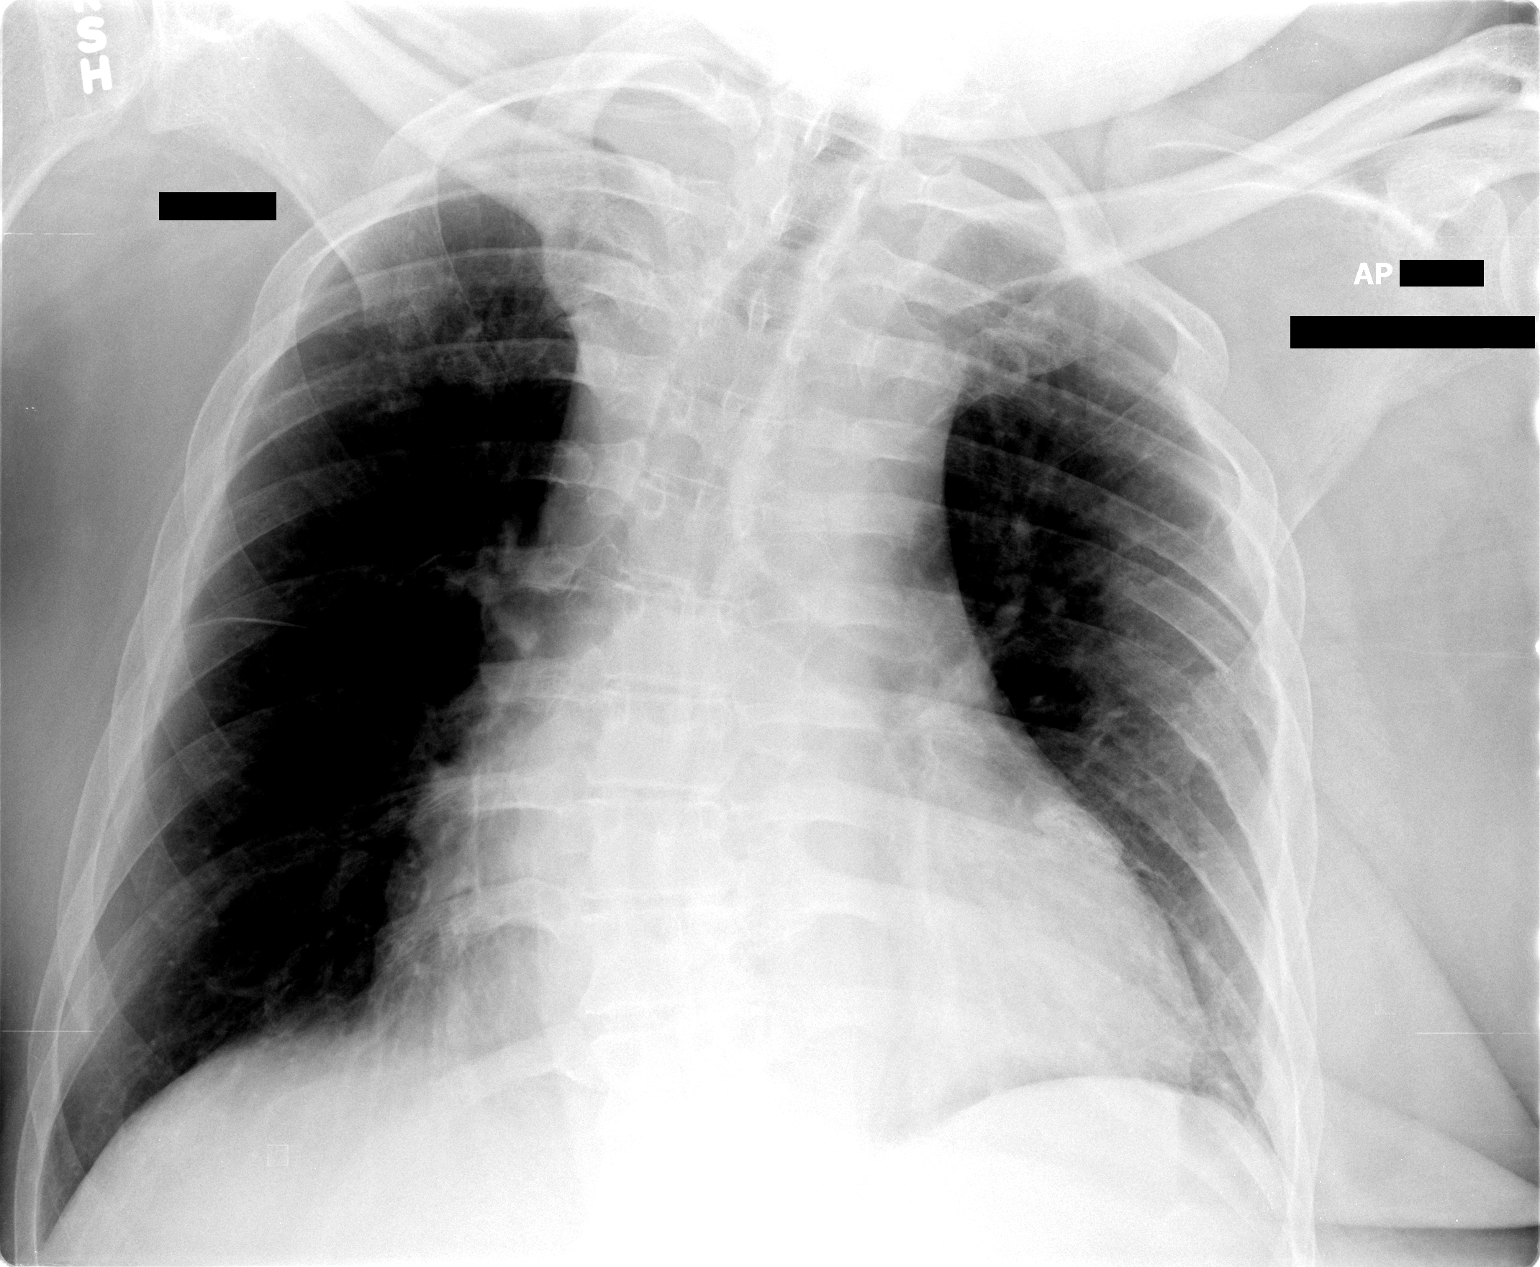

[1 of 1 positions shown; findings below may reference images not displayed]

FINDINGS: Cardiomegaly with mild central pulmonary vascular prominence without pulmonary edema.  Biapical pleural thickening remains. The CT demonstrated pulmonary parenchymal changes most notable in the lung bases are less apparent on present exam.  Dextroscoliosis.  PICC line has been removed.
IMPRESSION: 1.  Cardiomegaly.  
2.  Mediastinal prominence partially explained by AP magnification. On recent chest CT, mediastinal adenopathy was noted and is less apparent on the present examination as are the pulmonary parenchymal changes at the lung bases.

## 2009-07-30 ENCOUNTER — Inpatient Hospital Stay (HOSPITAL_COMMUNITY): Admission: EM | Admit: 2009-07-30 | Discharge: 2009-08-14 | Payer: Self-pay | Admitting: Emergency Medicine

## 2009-08-02 ENCOUNTER — Ambulatory Visit: Payer: Self-pay | Admitting: Pulmonary Disease

## 2010-01-08 IMAGING — CR DG CHEST 1V PORT
2 series · 2 of 2 positions shown · non-contrast
Comparison: 10/11/2007.

Portable semi upright AP chest, 05/27/2007, series or 58 hours.
INDICATION: Shortness of breath.

[view not recorded (1 of 2)]
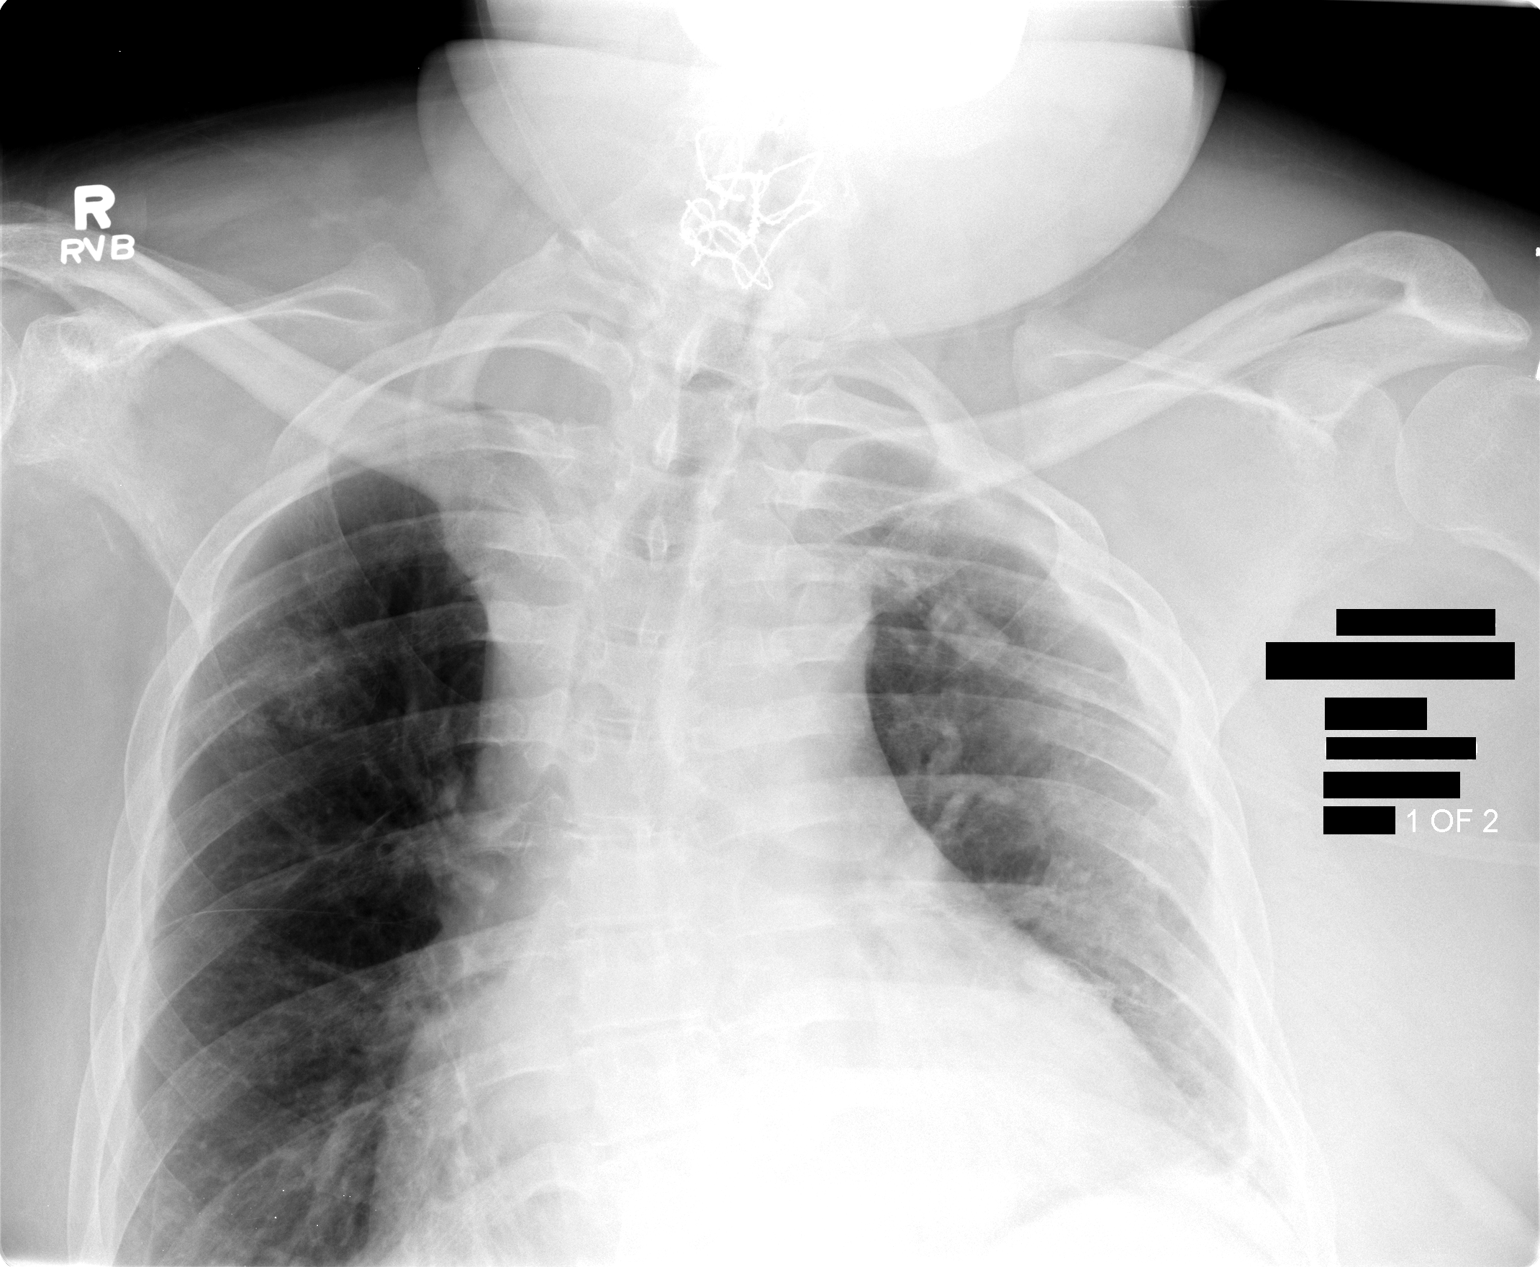

[view not recorded (2 of 2)]
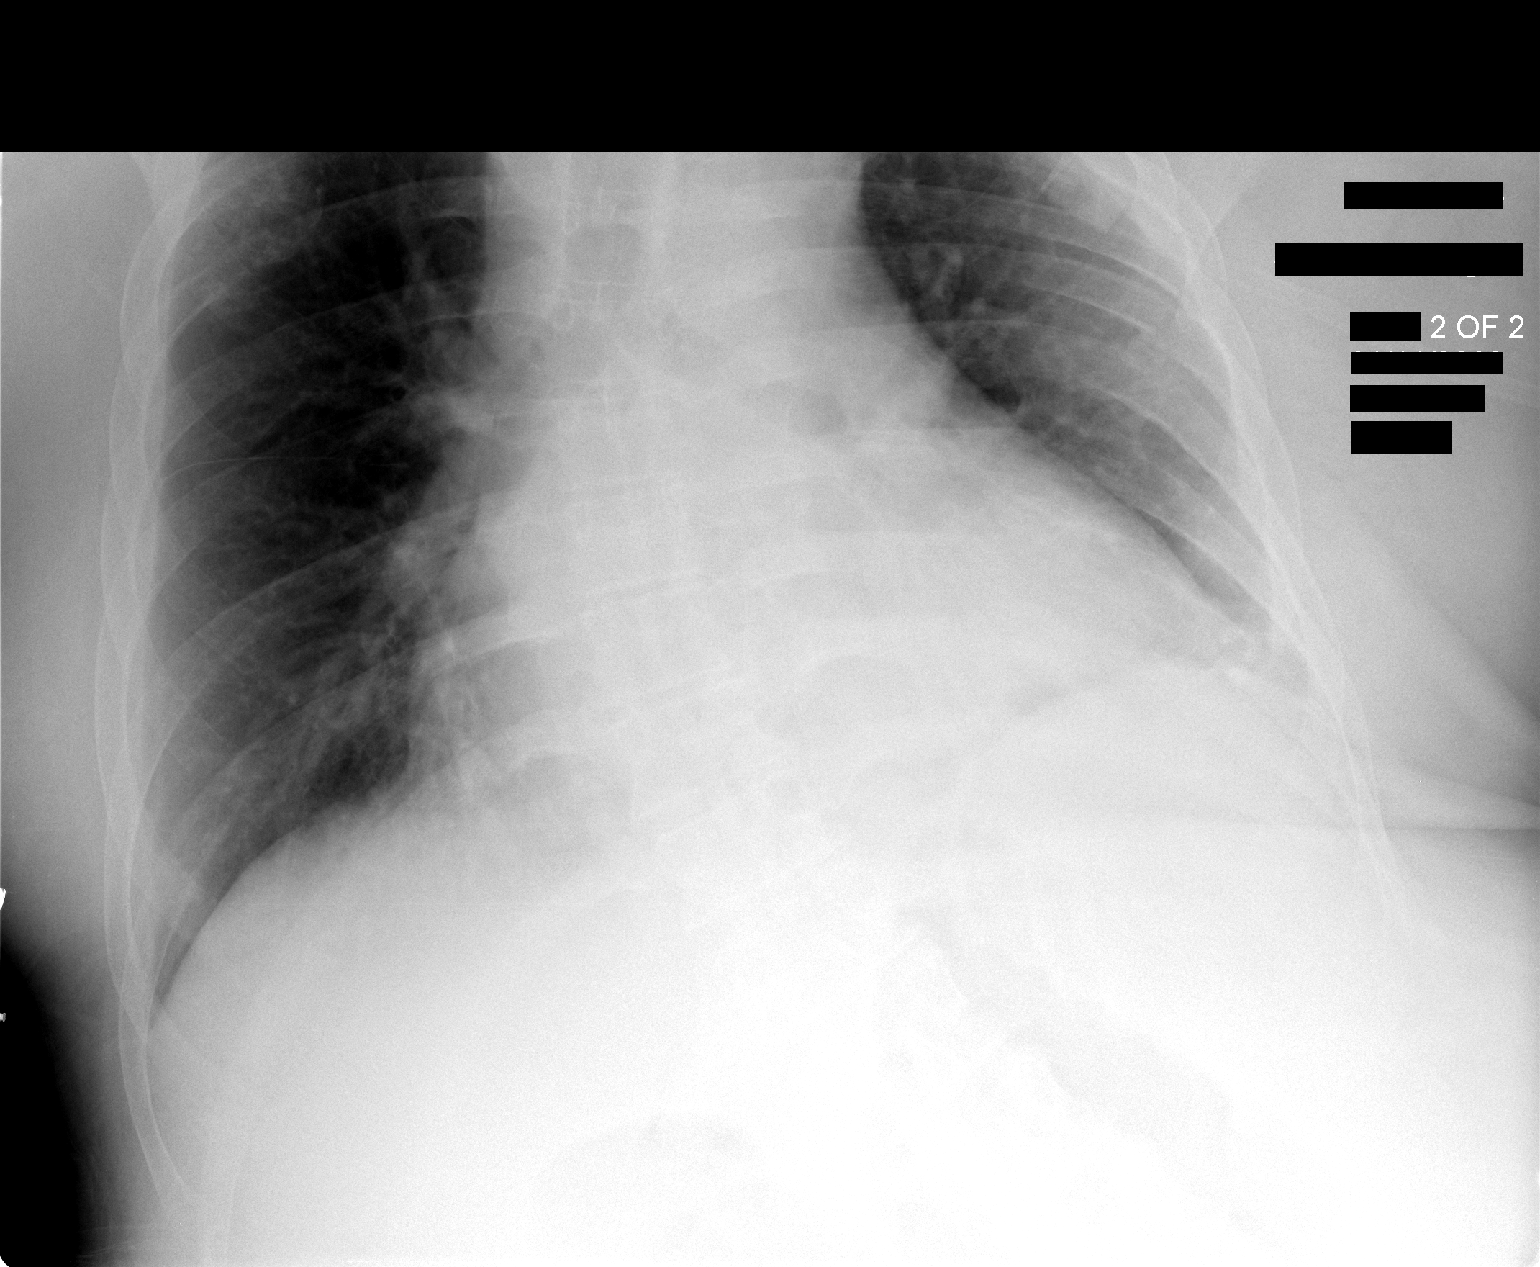

[2 of 2 positions shown; findings below may reference images not displayed]

FINDINGS: Cervical spine is surgical fixation wires again noted.
Cardiomegaly.
Thoracolumbar scoliosis.
Left basilar atelectasis.
Small focus of increased density adjacent to inferior margin right scapula. On
review of prior CT, there was deformity extending off the inferior margin of the
right scapula, this likely accounts for this opacity. This is unchanged since
the prior exam of September 2006.
IMPRESSION: 1. Cardiomegaly.
2. No acute cardiopulmonary disease.

## 2010-01-29 IMAGING — CR DG CHEST 1V PORT
1 series · 1 of 1 positions shown · non-contrast
Comparison: 05/27/07 radiographs and CT done 10/02/06.

CLINICAL DATA: Chest pain and dyspnea.
 PORTABLE CHEST - 1 VIEW (5315 hours):

[AP]
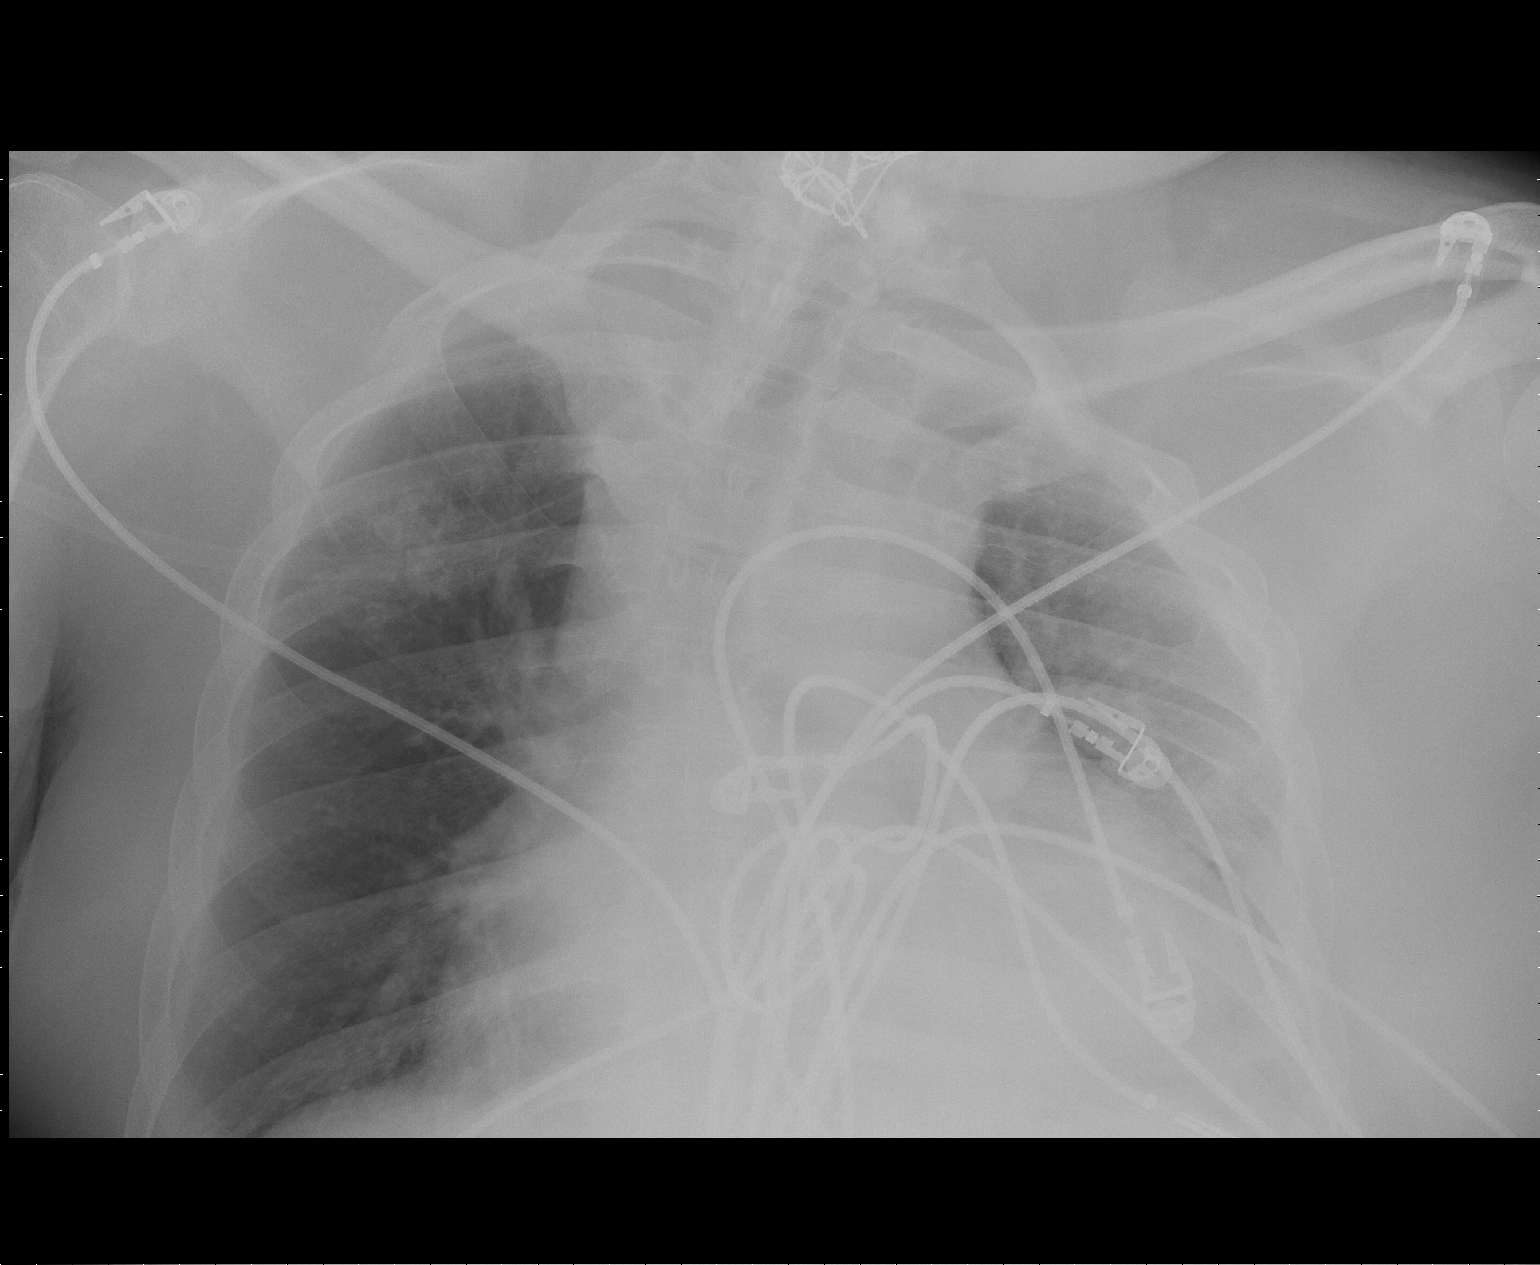

[1 of 1 positions shown; findings below may reference images not displayed]

FINDINGS: There is stable cardiomegaly and superior mediastinal widening.  Opacity at the left lung base is unchanged and correlates with atelectasis and pleural thickening on the prior CT.  The right lung is clear.  Density projecting over the upper right chest corresponds with a scapular lesion better demonstrated by prior CT. this could reflect a posttraumatic finding or an osteochondroma.
IMPRESSION: 1.  Stable cardiomegaly and stable chronic left basilar airspace disease.  No acute findings.
 2.  Right scapular lesion appears grossly stable.  If the patient has progressive pain in this area, follow-up imaging may be warranted.

## 2010-02-18 ENCOUNTER — Encounter (HOSPITAL_BASED_OUTPATIENT_CLINIC_OR_DEPARTMENT_OTHER)
Admission: RE | Admit: 2010-02-18 | Discharge: 2010-05-19 | Payer: Self-pay | Source: Home / Self Care | Attending: Internal Medicine | Admitting: Internal Medicine

## 2010-03-11 ENCOUNTER — Encounter: Payer: Self-pay | Admitting: Cardiovascular Disease

## 2010-03-11 DIAGNOSIS — I739 Peripheral vascular disease, unspecified: Secondary | ICD-10-CM | POA: Insufficient documentation

## 2010-03-12 ENCOUNTER — Encounter: Payer: Self-pay | Admitting: Cardiovascular Disease

## 2010-03-12 ENCOUNTER — Ambulatory Visit: Payer: Self-pay

## 2010-04-22 ENCOUNTER — Inpatient Hospital Stay (HOSPITAL_COMMUNITY): Admission: EM | Admit: 2010-04-22 | Discharge: 2009-12-15 | Payer: Self-pay | Admitting: Emergency Medicine

## 2010-05-21 ENCOUNTER — Encounter (HOSPITAL_BASED_OUTPATIENT_CLINIC_OR_DEPARTMENT_OTHER)
Admission: RE | Admit: 2010-05-21 | Discharge: 2010-06-15 | Payer: Self-pay | Source: Home / Self Care | Attending: Internal Medicine | Admitting: Internal Medicine

## 2010-06-05 IMAGING — CR DG CHEST 1V PORT
1 series · 1 of 1 positions shown · non-contrast
Comparison: 06/17/2007

CLINICAL DATA: PORTABLE CHEST - 1 VIEW

[AP]
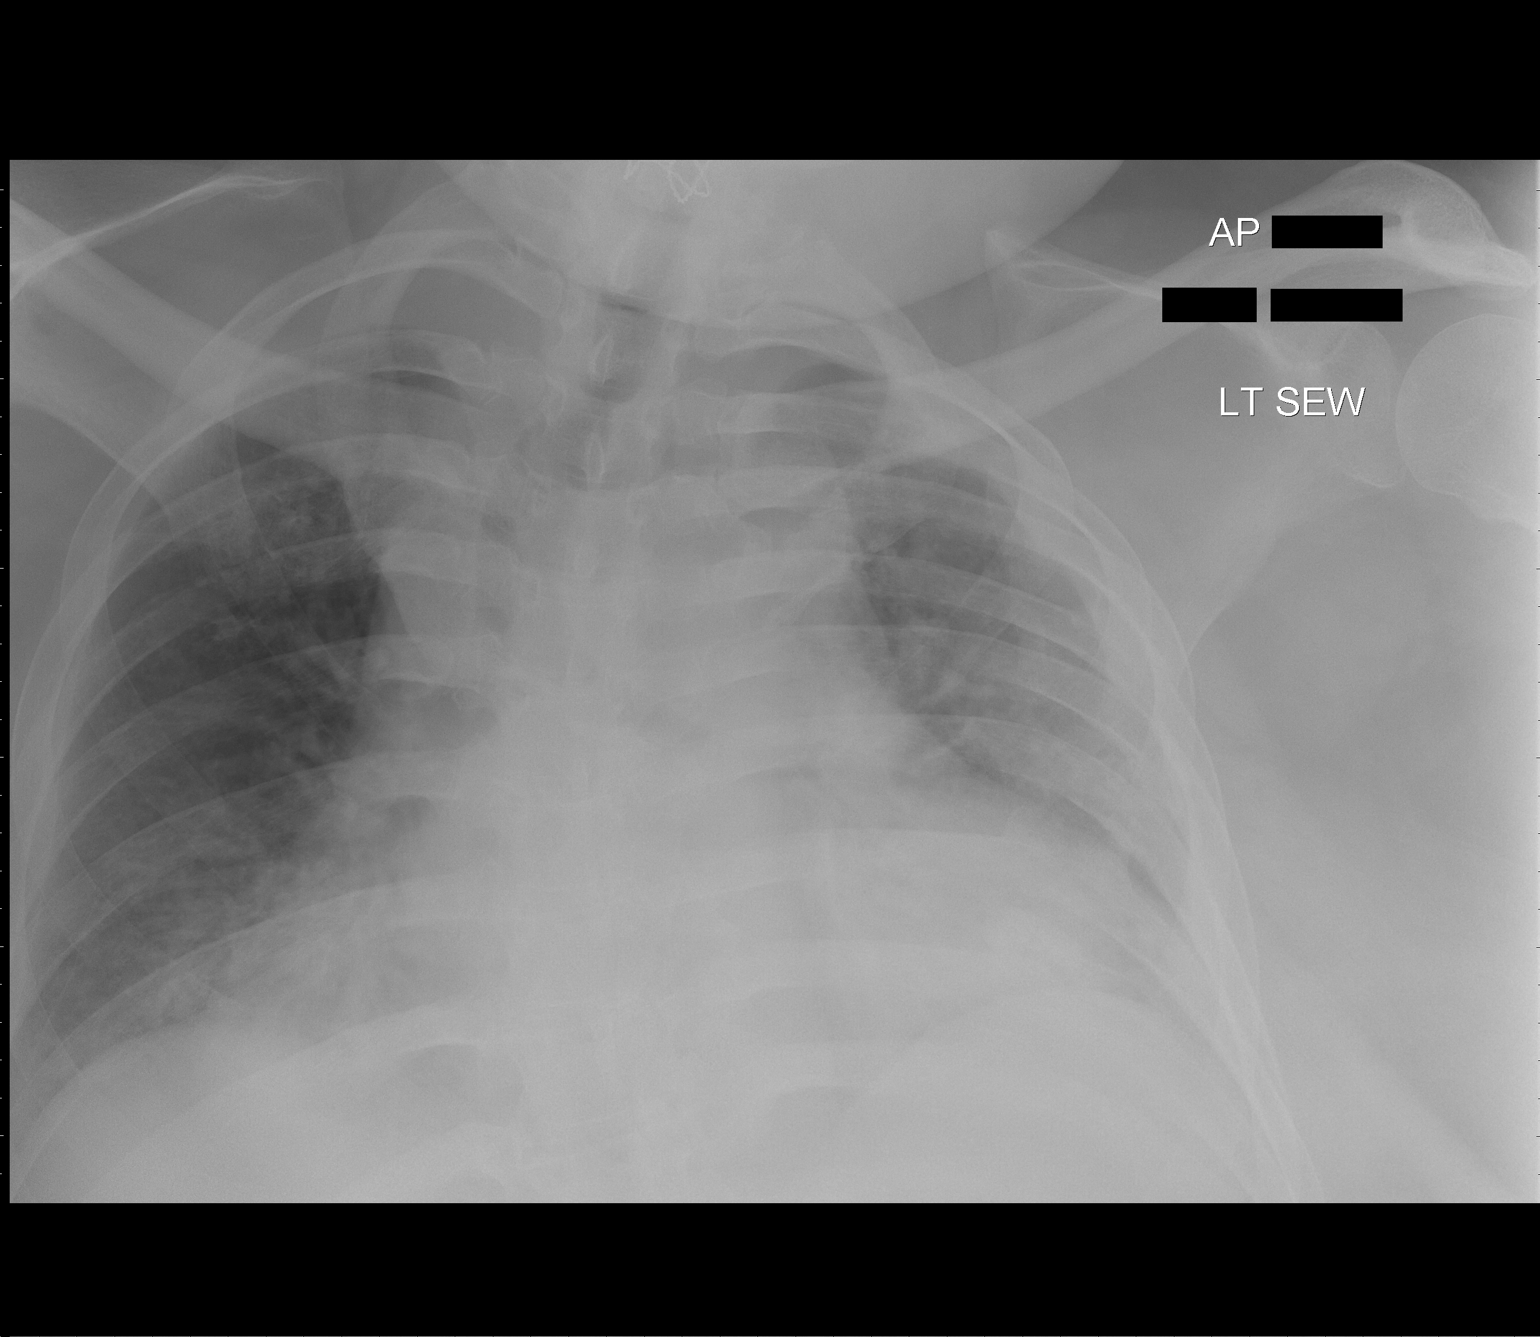

[1 of 1 positions shown; findings below may reference images not displayed]

FINDINGS: There is cardiomegaly with vascular congestion.  Patchy
bilateral lower lobe opacities, right greater than left, could
represent asymmetric edema or infection.  Stable biapical pleural
thickening.
IMPRESSION: Cardiomegaly with bilateral lower lobe opacities, right greater
than left, edema versus infection.

## 2010-06-05 IMAGING — CR DG CHEST 1V PORT
1 series · 1 of 1 positions shown · non-contrast
Comparison: 6454 hours the same day.

CLINICAL DATA: 42-year-old male with PICC placement.

PORTABLE CHEST - 1 VIEW

[view not recorded]
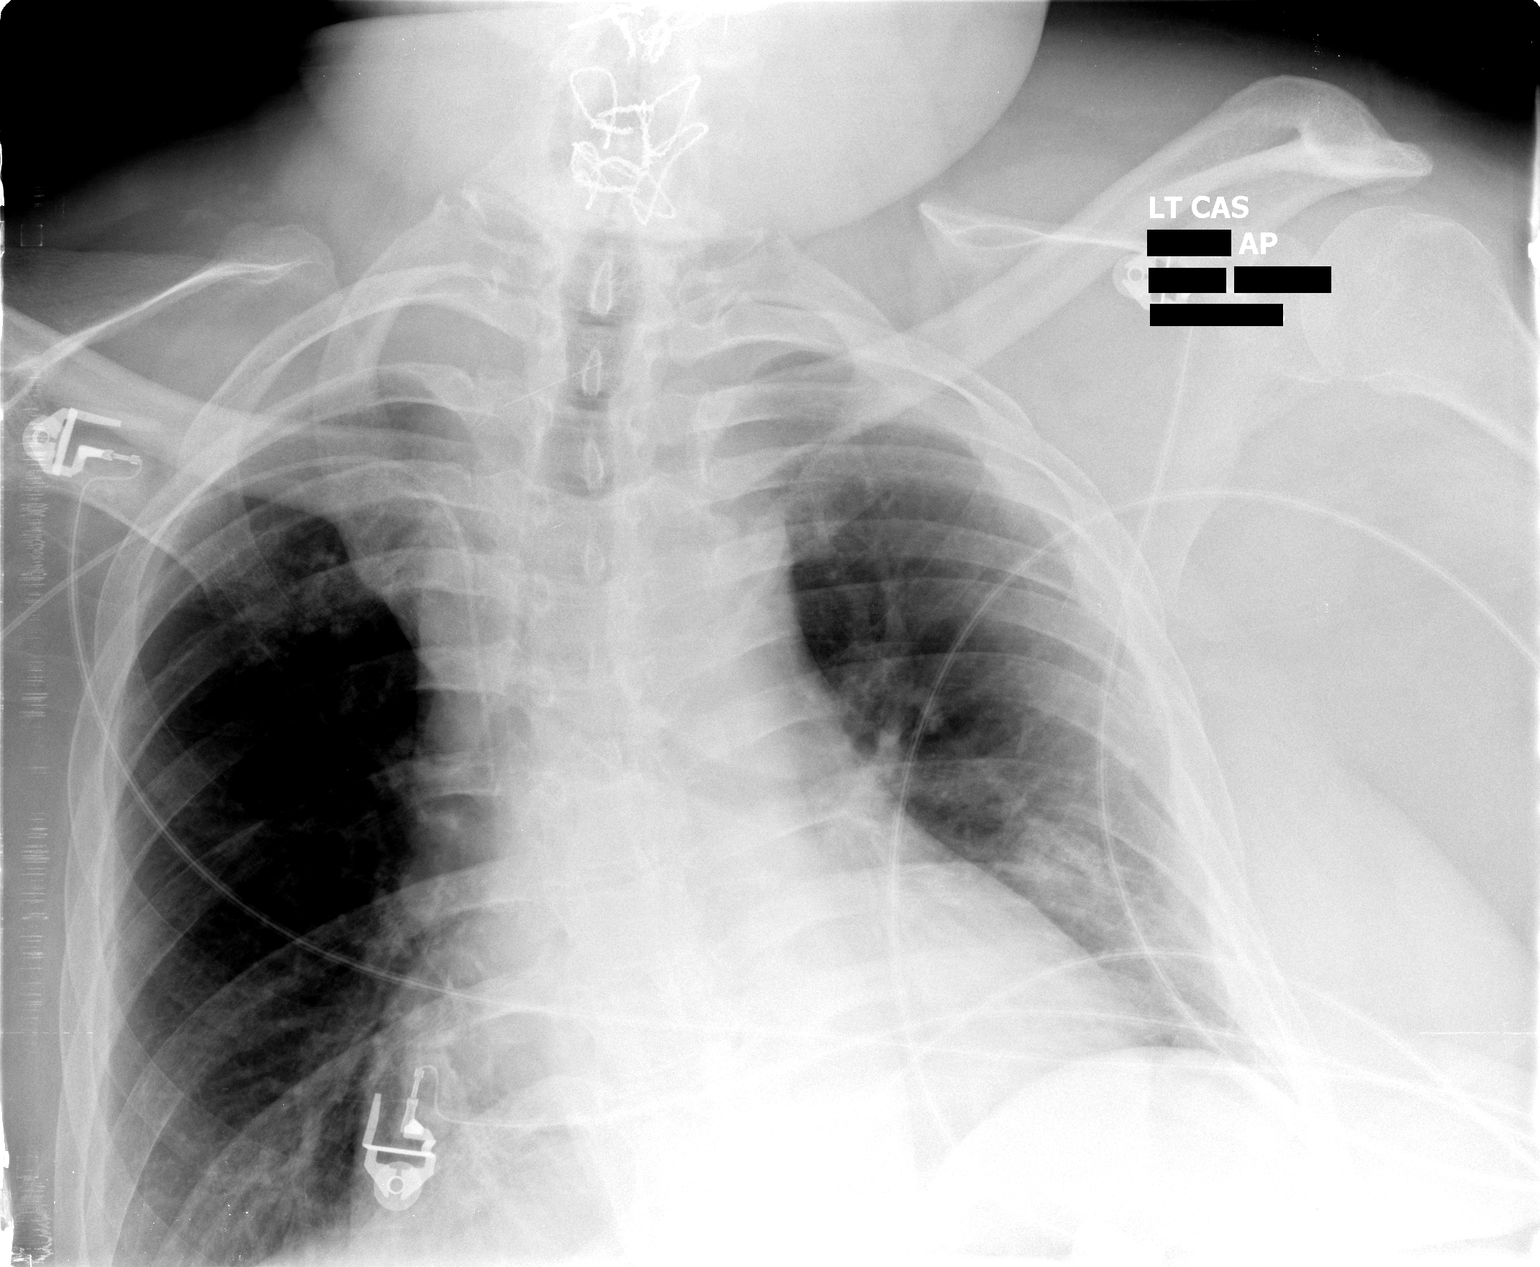

[1 of 1 positions shown; findings below may reference images not displayed]

FINDINGS: AP portable semi erect view at 8339 hours.  Right upper
extremity approach PICC line identified, tip at the level of the
mid superior vena cava.

Improved bilateral pulmonary ventilation and decreased pulmonary
vascular congestion.  Stable cardiac size mediastinal contour.  No
pneumothorax identified.  Cerclage wires project over the lower
cervical spine.
IMPRESSION: 1. Right upper extremity approach PICC line catheter, tip at the
level of the mid superior vena cava.
2.  Improved pulmonary ventilation..

## 2010-06-06 IMAGING — CR DG ABDOMEN 1V
2 series · 2 of 2 positions shown · non-contrast
Comparison: 07/20/2006

10/23/07 – CORRECTED EXAM ORDER:  The exam for this study was incorrectly ordered and should have been charged as a PORTABLE exam.  The patient’s account has been appropriately adjusted, and no changes have been made to the original report.
CLINICAL DATA: Nausea, vomiting

 ABDOMEN - 1 VIEW

[AP (1 of 2)]
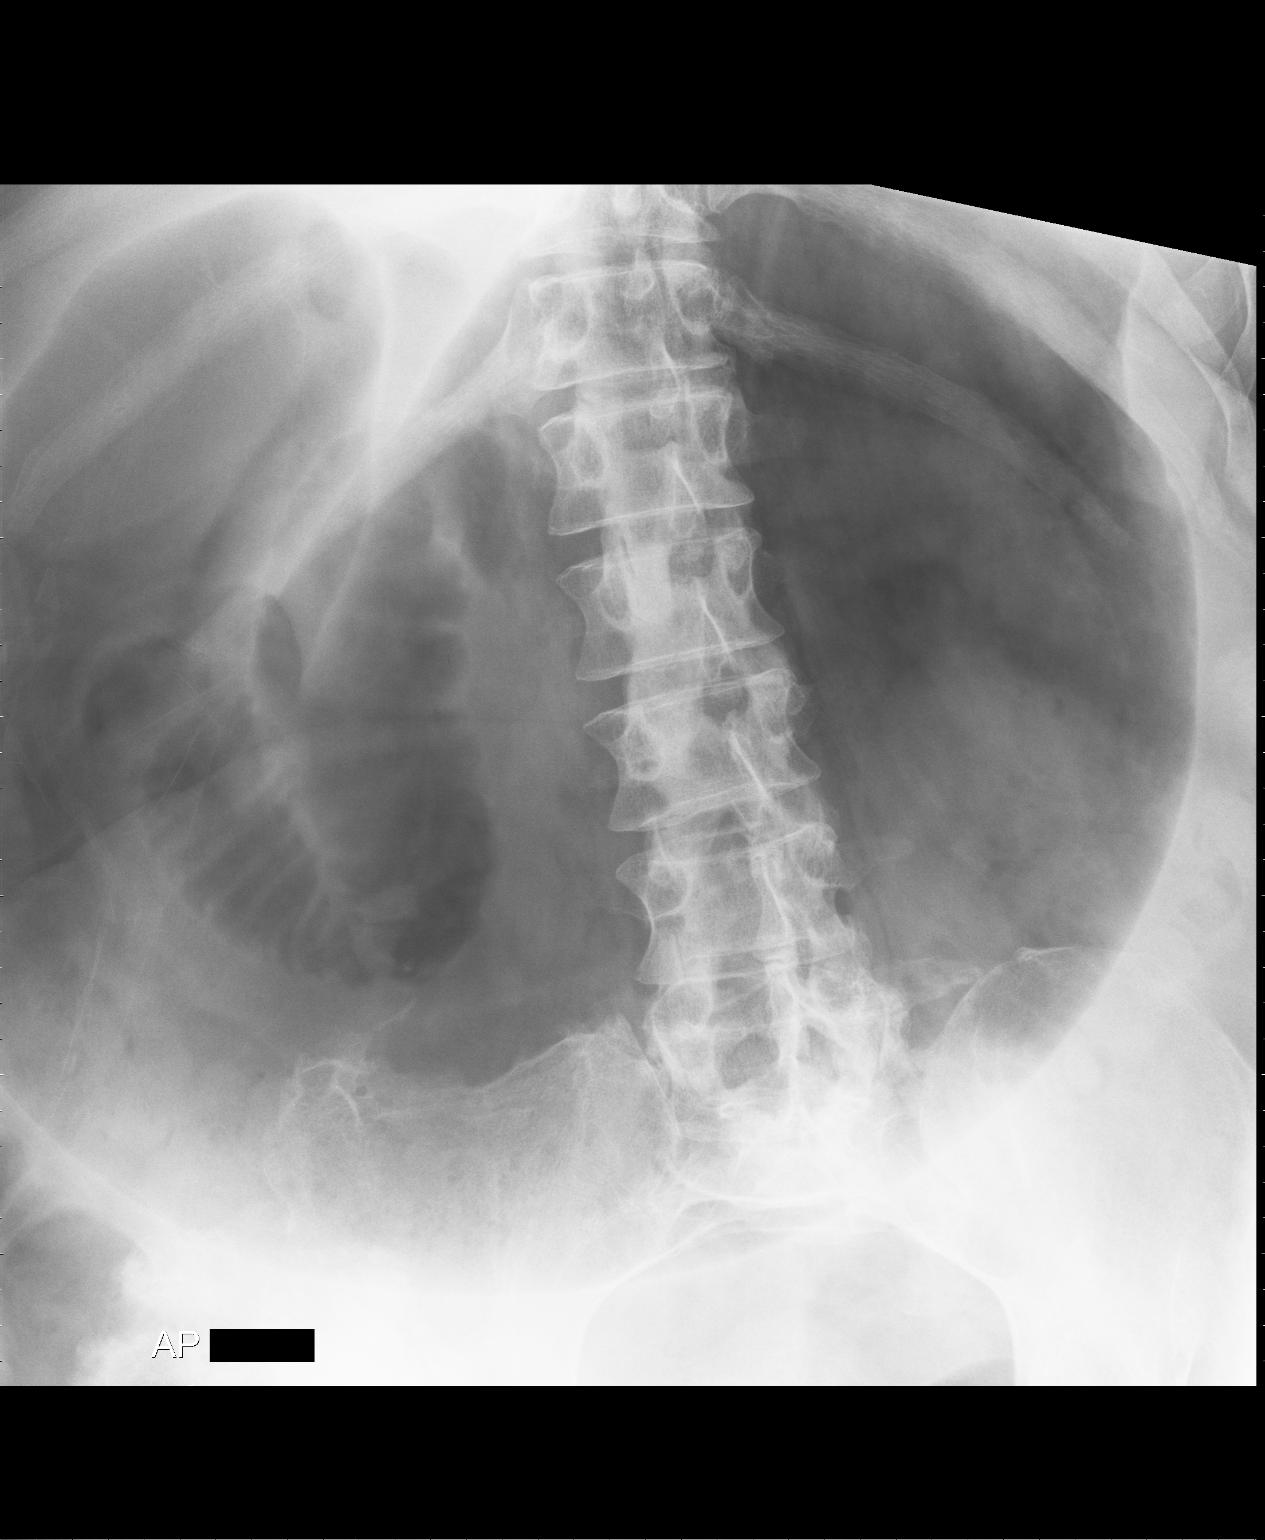

[AP (2 of 2)]
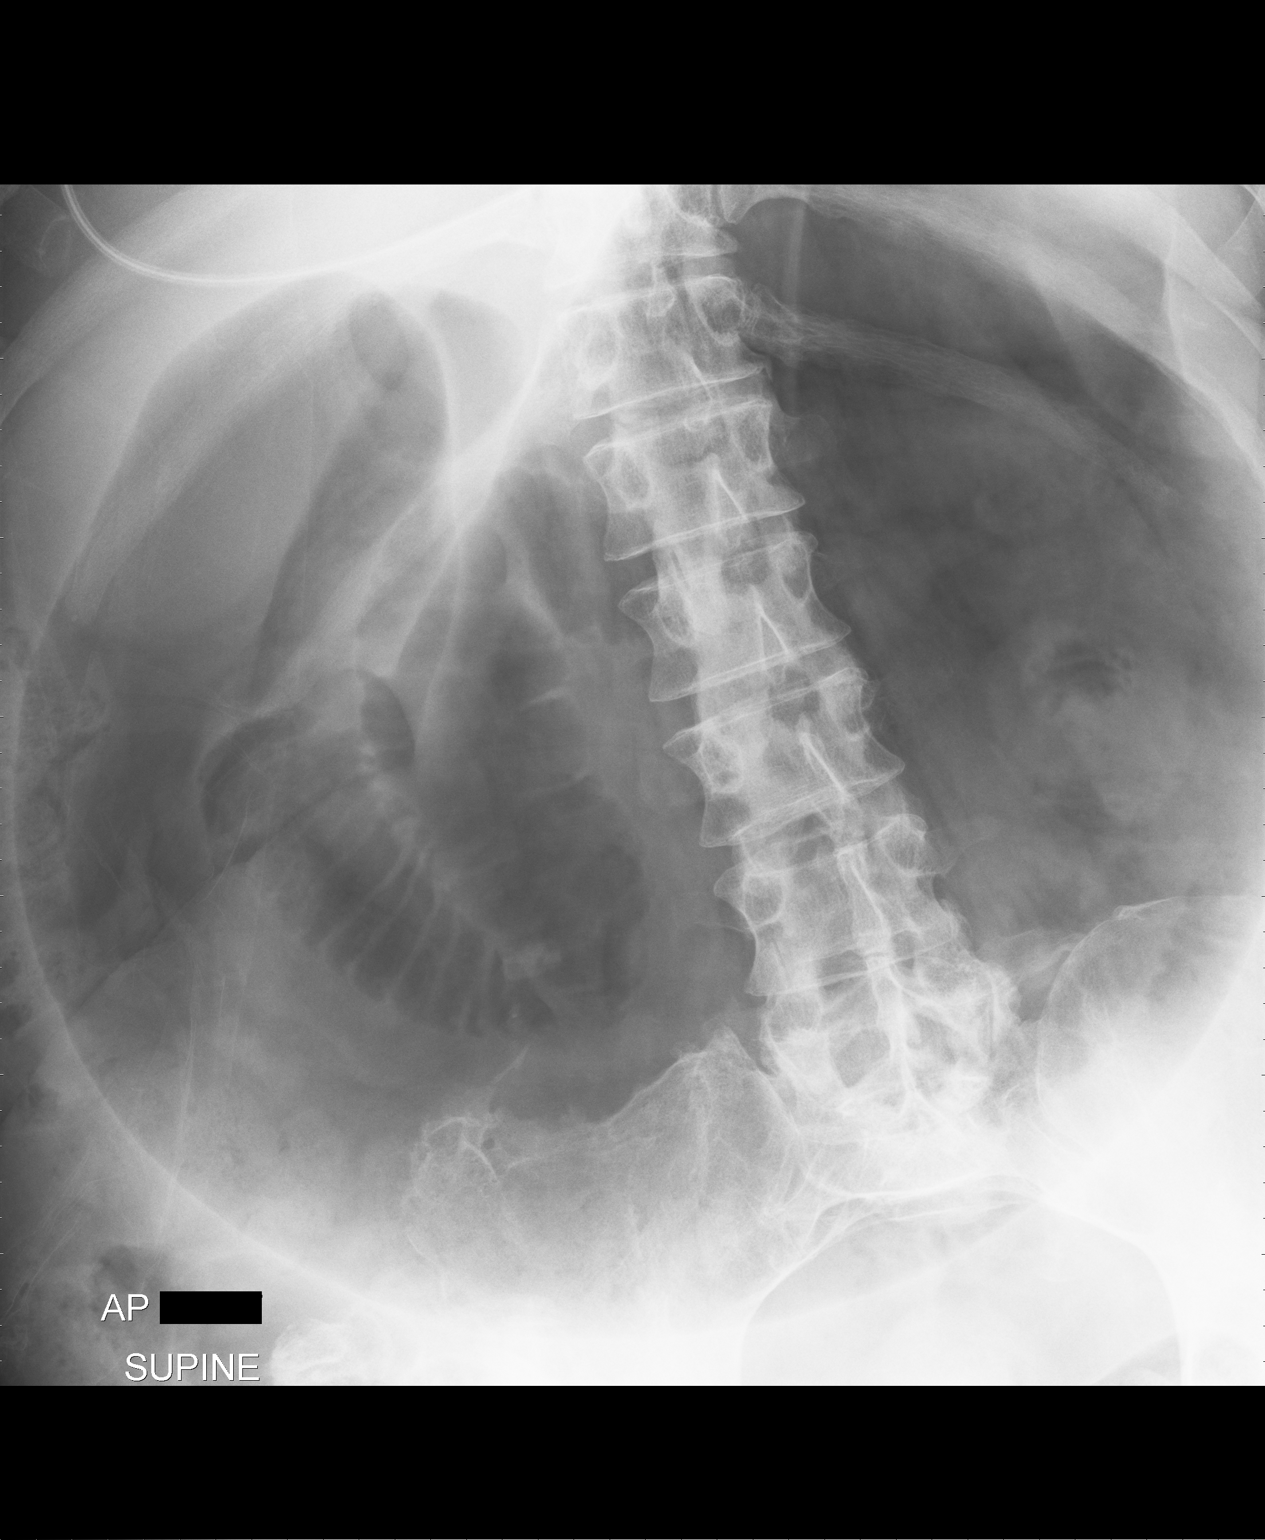

[2 of 2 positions shown; findings below may reference images not displayed]

FINDINGS: Massive gaseous distension of the stomach noted. Mildly
 dilated small bowel loops also noted in the mid abdomen . No
 supine evidence of free air.
IMPRESSION: Massive gaseous distension of the stomach. Mildly prominent mid
 abdominal small bowel loops. Question gastric outlet obstruction
 or proximal small bowel obstruction.

## 2010-06-06 IMAGING — CR DG ABD PORTABLE 1V
1 series · 1 of 1 positions shown · non-contrast
Comparison: Earlier today

CLINICAL DATA: Evaluate NG tube placement.

ABDOMEN - 1 VIEW

[AP]
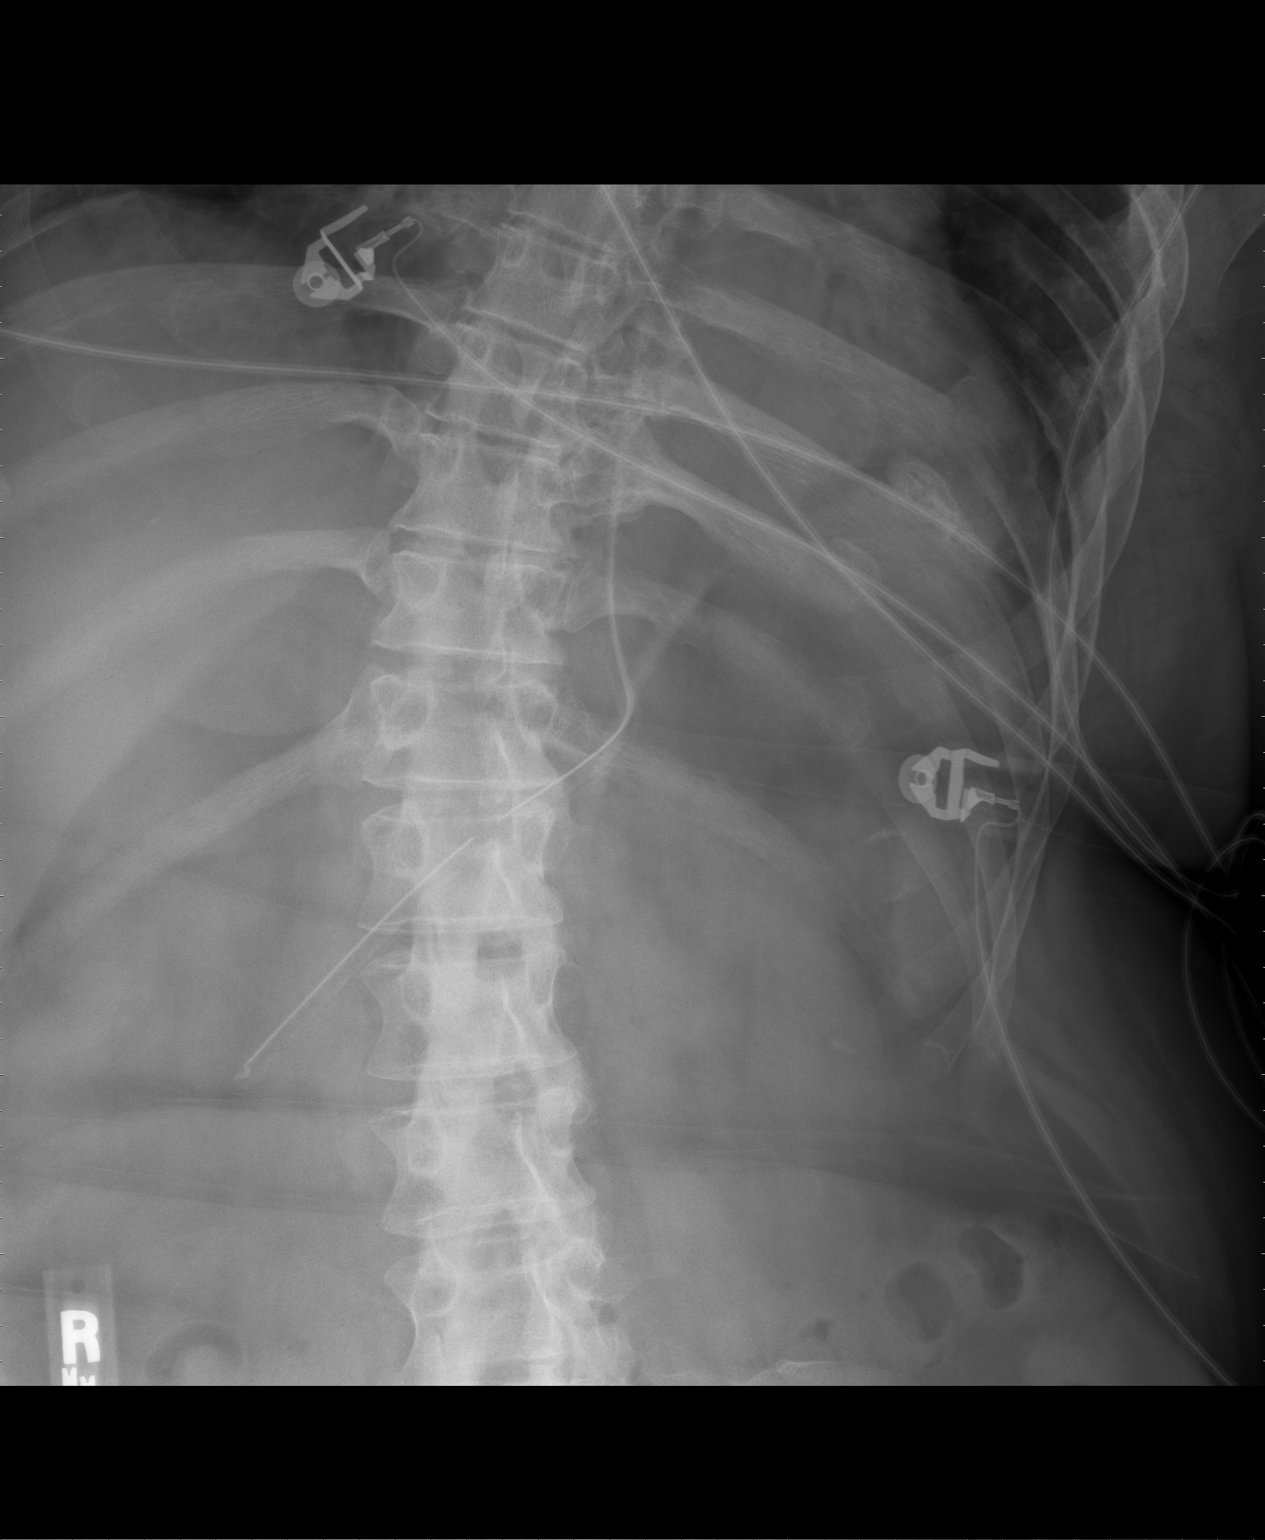

[1 of 1 positions shown; findings below may reference images not displayed]

FINDINGS: The nasogastric tube is identified within the upper
abdomen. The side port is below the GE junction. This is in the
expected location of the gastric lumen.
IMPRESSION: 1.  Interval placement of nasogastric tube.

## 2010-06-06 IMAGING — CR DG ABDOMEN 2V
2 series · 2 of 2 positions shown · non-contrast
Comparison: Radiographs from earlier today

CLINICAL DATA: Distended abdomen rule out small bowel obstruction

ABDOMEN - 2 VIEW

[w abdomen decub * (1 of 2)]
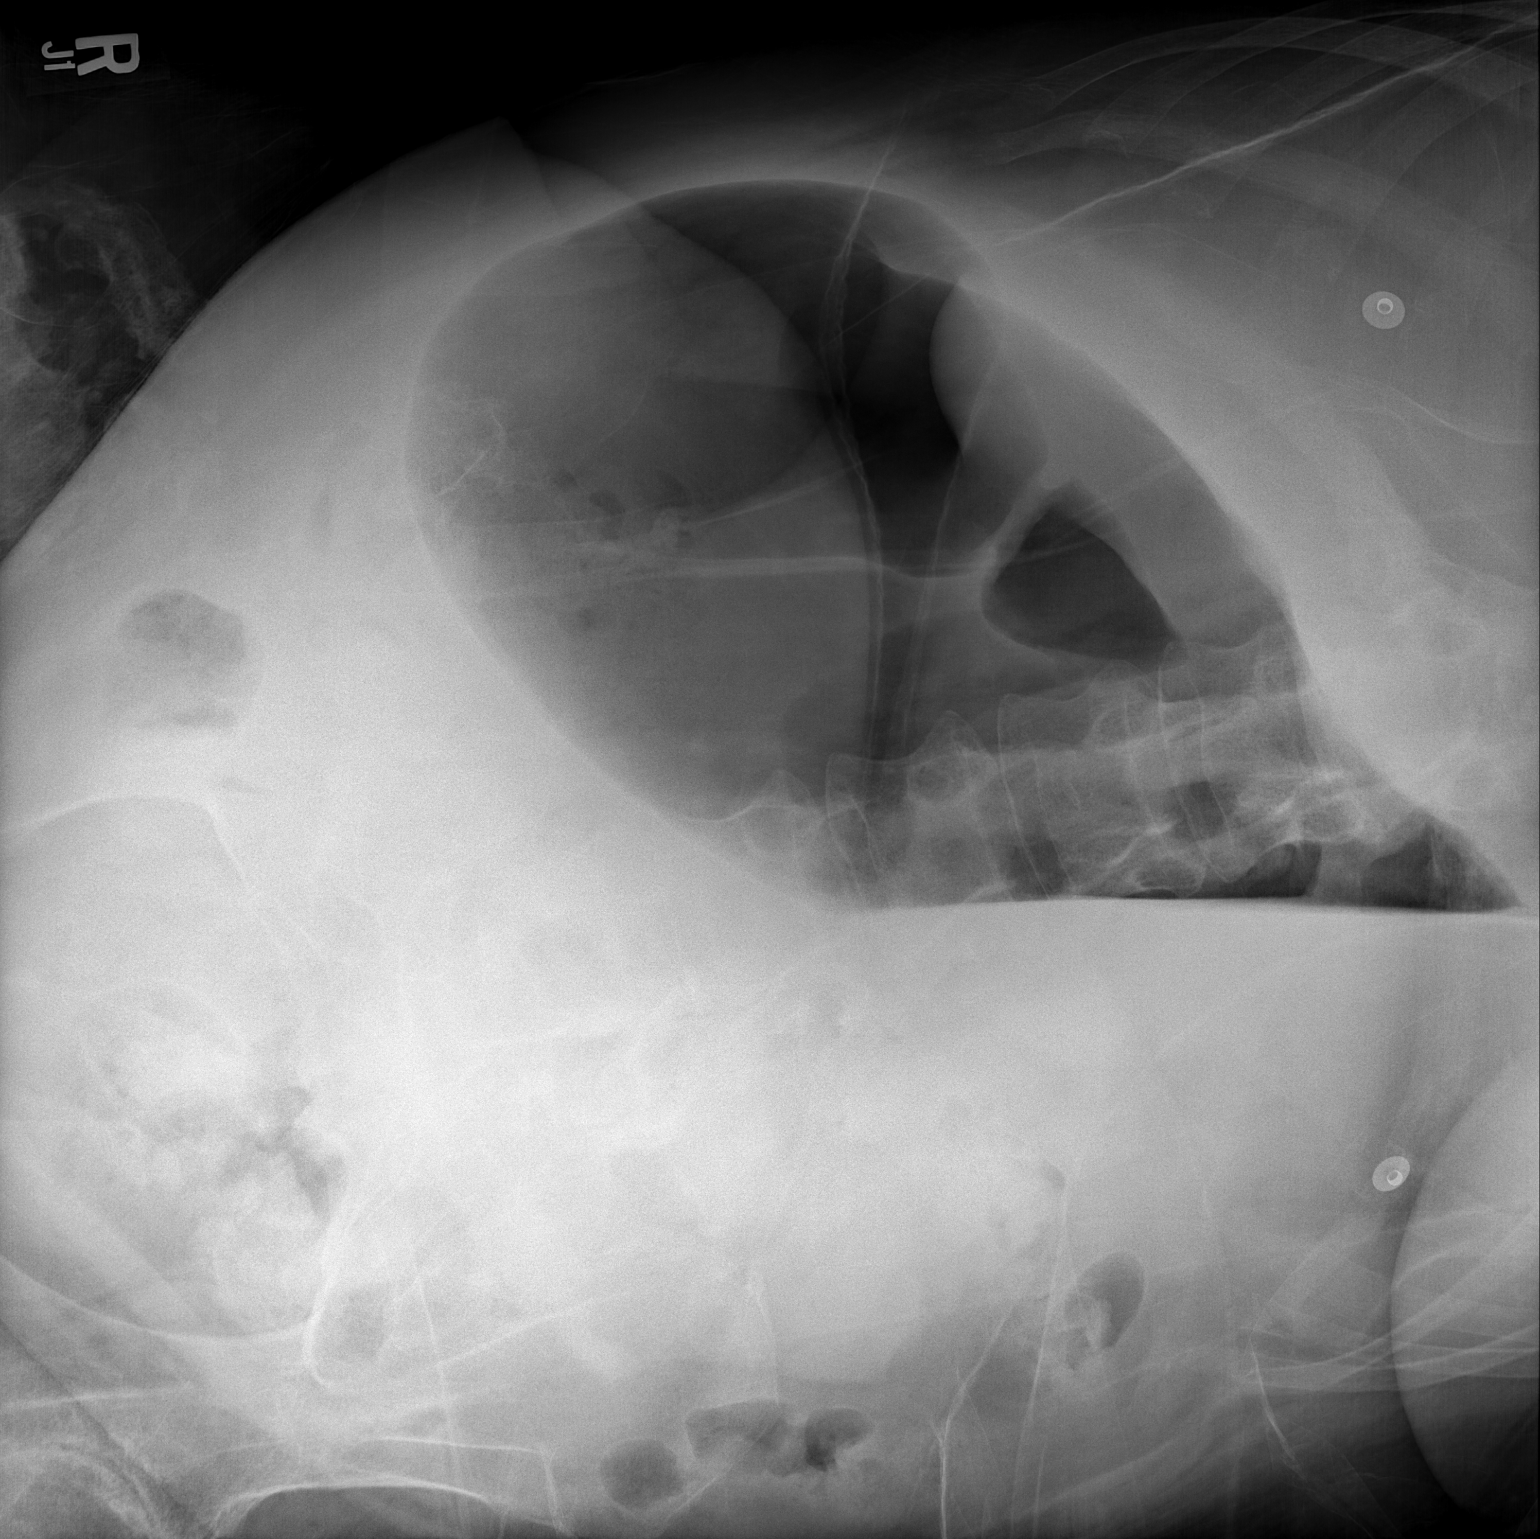

[w abdomen decub * (2 of 2)]
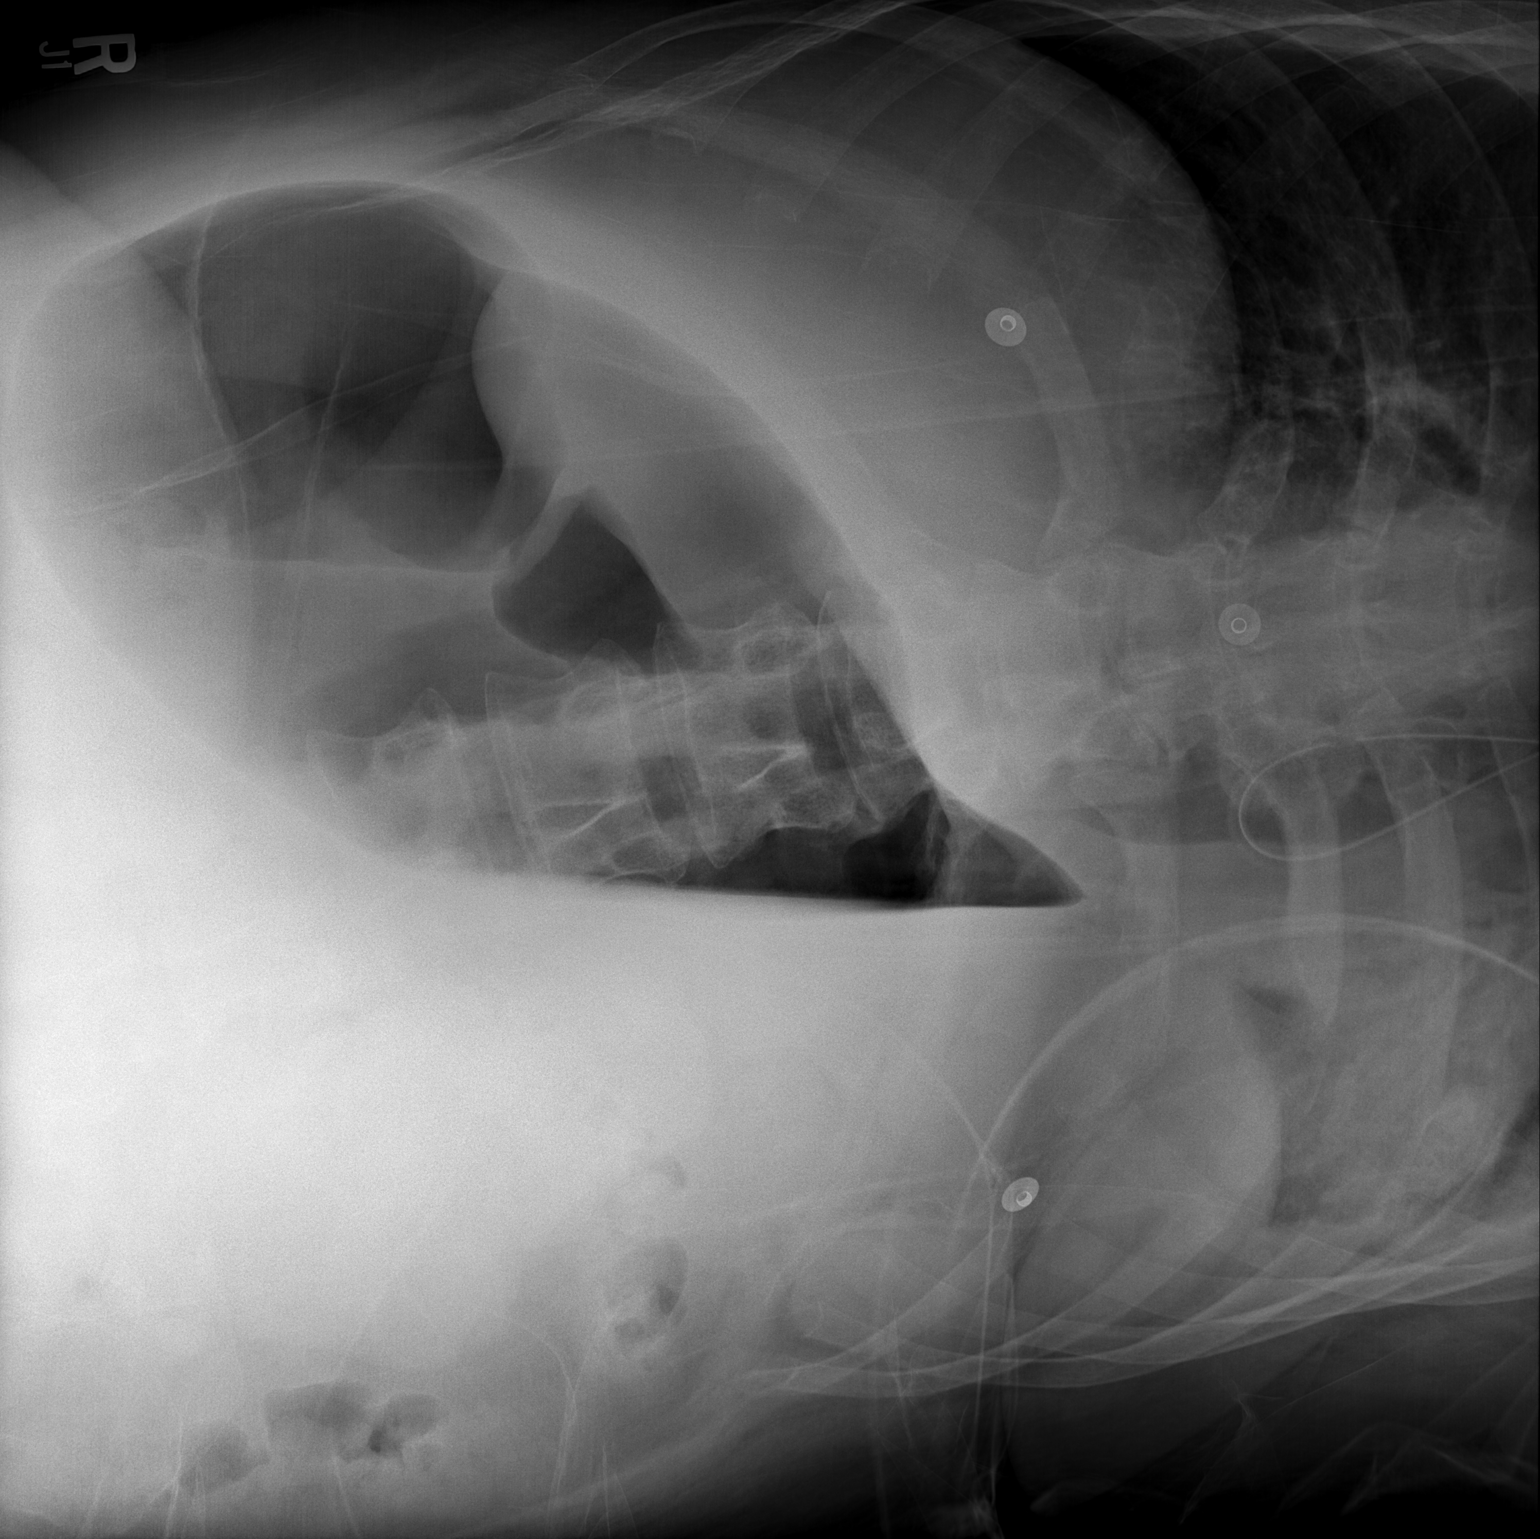

[2 of 2 positions shown; findings below may reference images not displayed]

FINDINGS: The stomach is massively dilated although somewhat
improved from earlier today.  An NG tube is in place but is coiled
in the esophagus and does not enter the stomach.  The pattern is
most compatible with gastric outlet obstruction.  No free air is
identified on the decubitus views.  The small bowel large bowel are
not dilated.  Advanced degenerative change in the right hip is
noted.
IMPRESSION: Massive gastric dilatation is improved from earlier today.  This is
compatible gastric bowel obstruction.  An NG tube is coiled in the
esophagus and does not enter the stomach.

## 2010-06-06 IMAGING — CR DG ABD PORTABLE 1V
1 series · 1 of 1 positions shown · non-contrast
Comparison: Abdominal radiographs done earlier today.

CLINICAL DATA: Gastric distension.  Nasogastric tube placement.

ABDOMEN - 1 VIEW

[view not recorded]
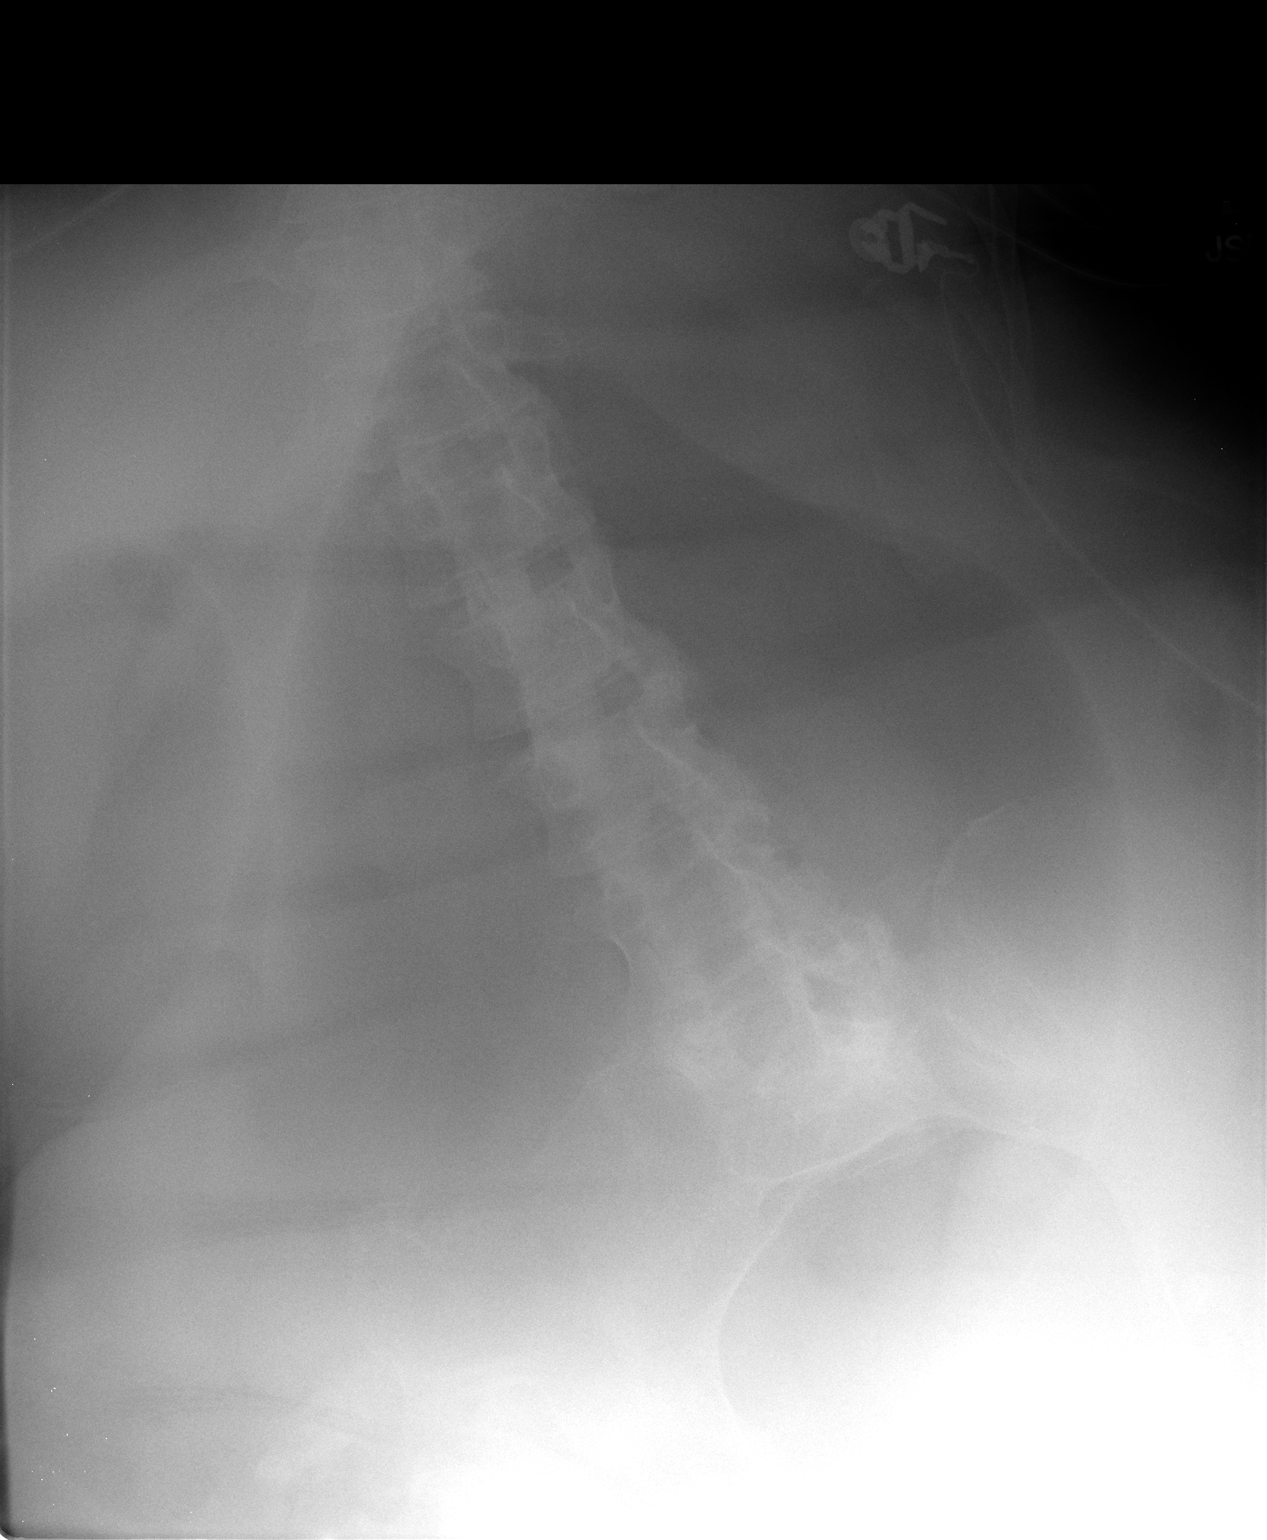

[1 of 1 positions shown; findings below may reference images not displayed]

FINDINGS: Portable examination at 4940 hours demonstrates no
nasogastric tube in the upper abdomen.  The tube was noted to be
coiled in the distal esophagus on the radiographs done earlier
today, but is not visualized on the current examination.  Marked
gastric distension remains.
IMPRESSION: No visible nasogastric tube in the upper abdomen.  Persistent
marked gastric distension.

## 2010-06-08 IMAGING — CR DG ABDOMEN 2V
4 series · 4 of 4 positions shown · non-contrast
Comparison: Two views abdomen 10/23/2007

CLINICAL DATA: Abdominal distension.

ABDOMEN - 2 VIEW

[w abdomen decub * (1 of 2)]
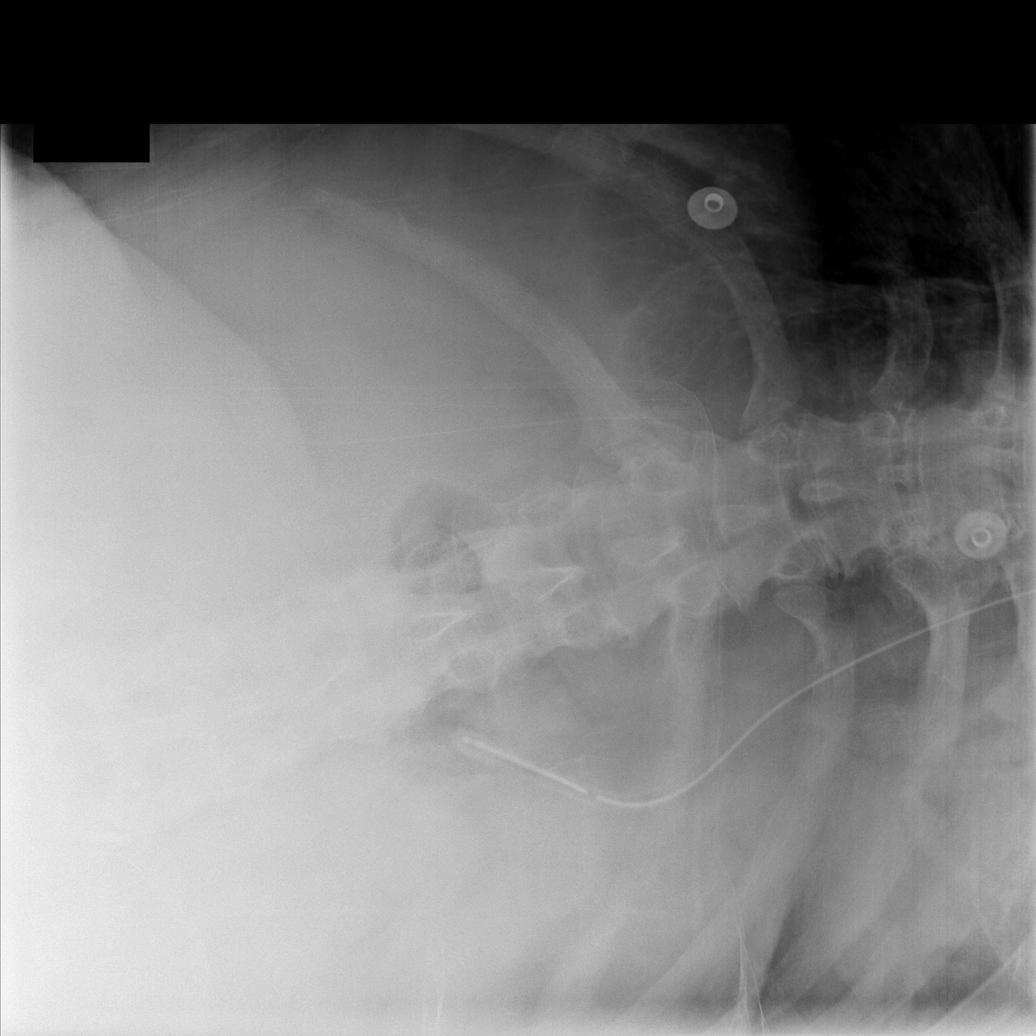

[w abdomen decub * (2 of 2)]
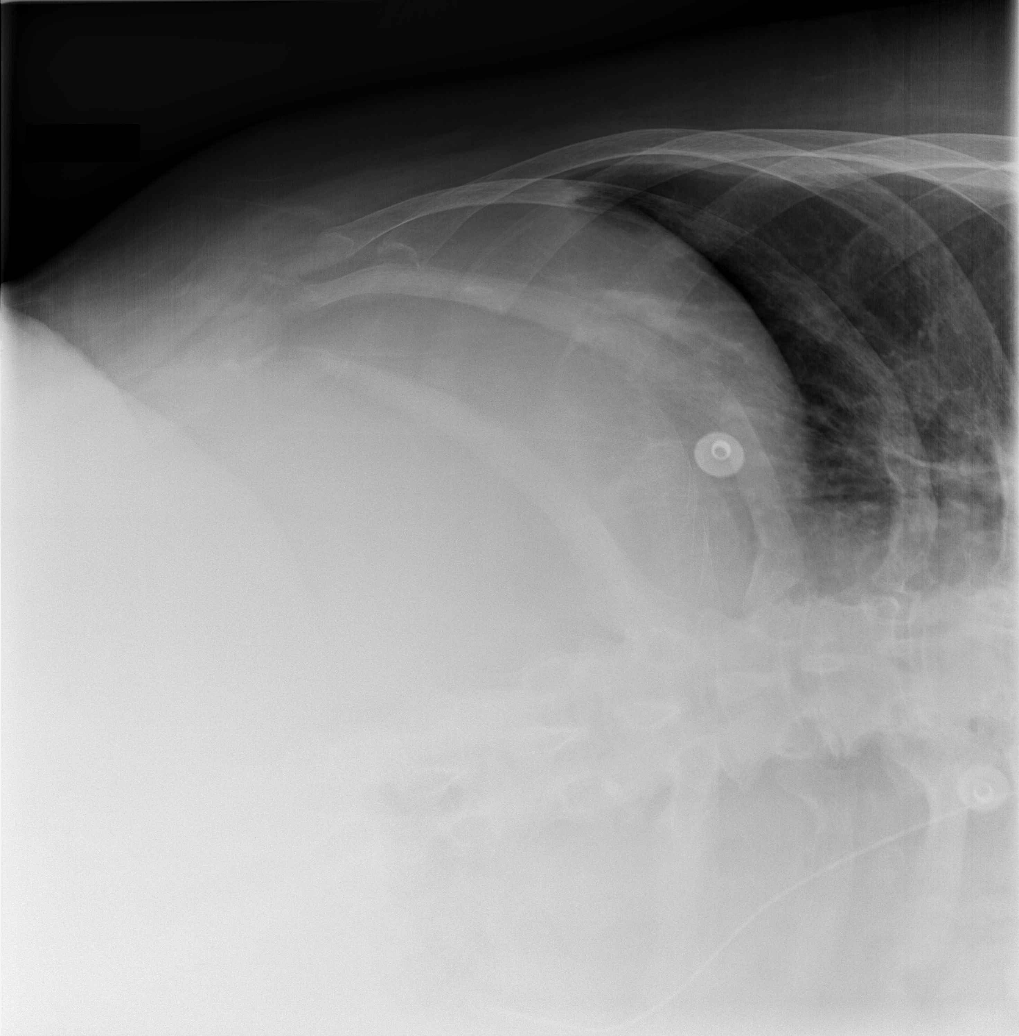

[t abdomen supine (1 of 2)]
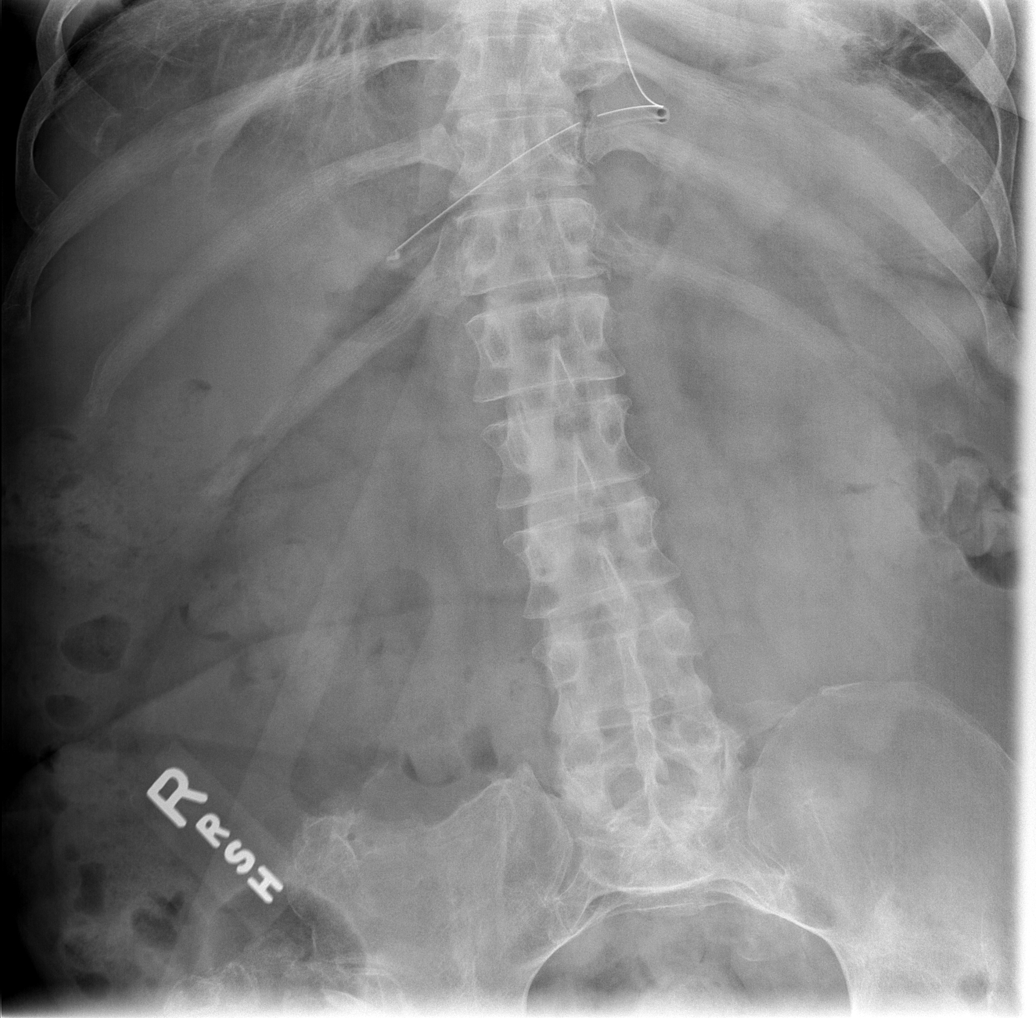

[t abdomen supine (2 of 2)]
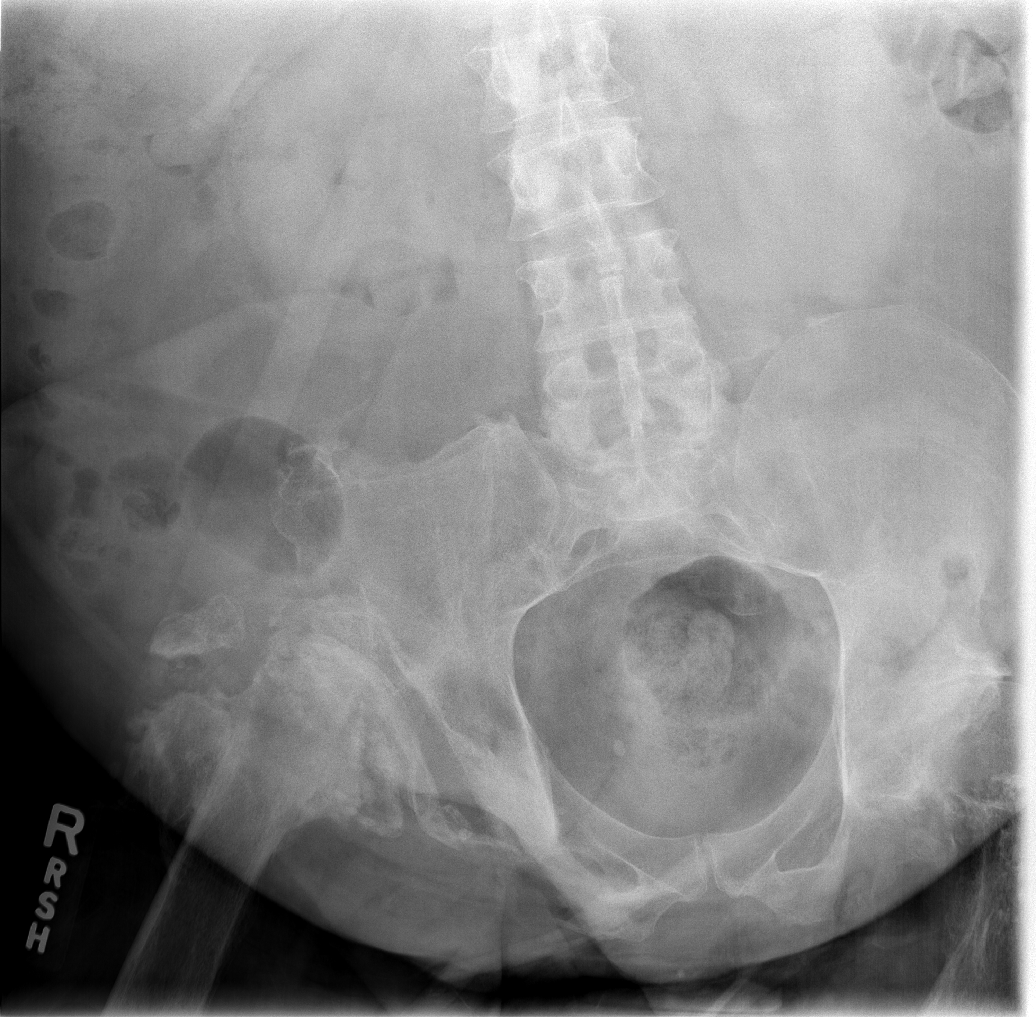

[4 of 4 positions shown; findings below may reference images not displayed]

FINDINGS: Patient has an NG tube in place.  There is no free
intraperitoneal air and the bowel gas pattern is normal.  Extensive
destructive change in the right iliac wing and about both hips is
likely due to chronic osteomyelitis.
IMPRESSION: Negative for free air obstruction.  Bowel gas pattern is markedly
improved.

## 2010-06-09 IMAGING — CR DG CHEST 1V PORT
2 series · 2 of 2 positions shown · non-contrast
Comparison: Portable chest 10/22/2007 and 06/17/2007.

CLINICAL DATA: Pneumonia.

PORTABLE CHEST - 1 VIEW

[view not recorded (1 of 2)]
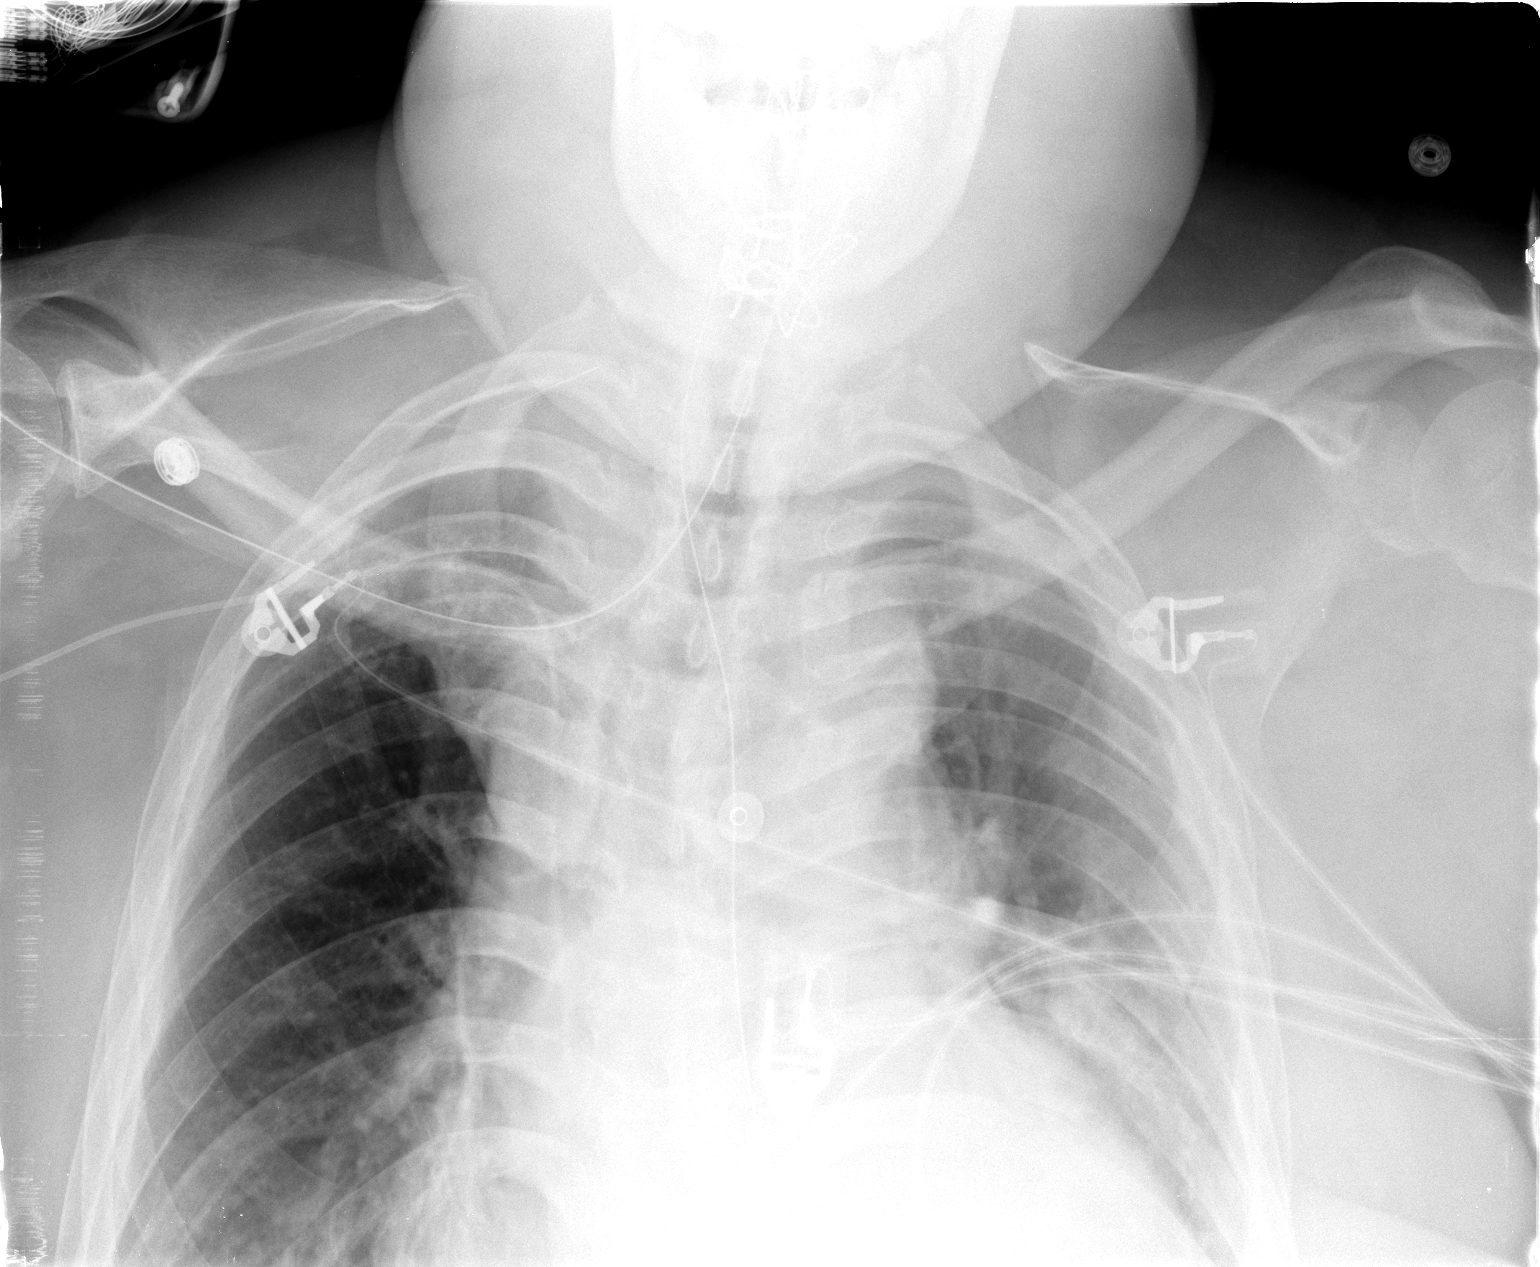

[view not recorded (2 of 2)]
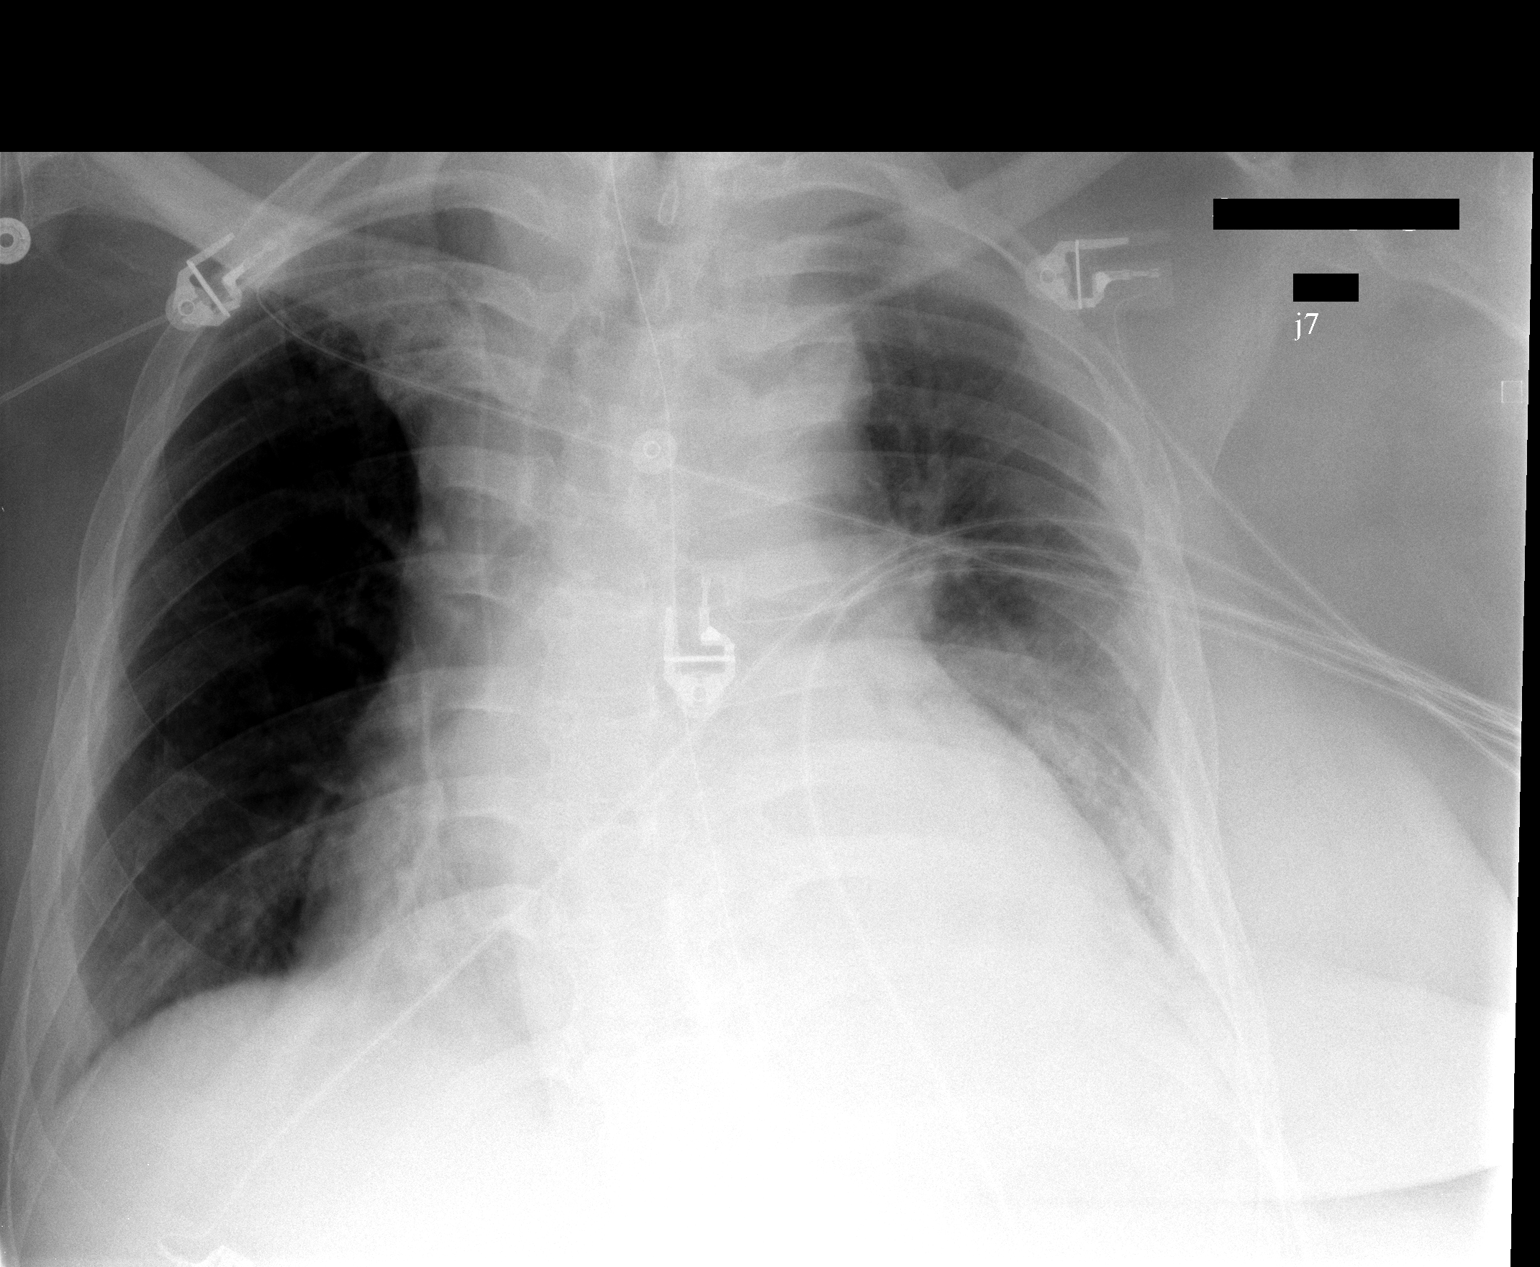

[2 of 2 positions shown; findings below may reference images not displayed]

FINDINGS: The the patient has a new NG tube which courses into the
stomach and below the inferior margin of the film.  There has been
worsening in aeration in the left base compatible with increasing
atelectasis and / or pneumonia.  Small bilateral pleural effusions
are noted.
IMPRESSION: 1.  Worsening left basilar aeration compatible with increasing
atelectasis/pneumonia.  No other notable change.

## 2010-06-12 IMAGING — CR DG CHEST 1V PORT
2 series · 2 of 2 positions shown · non-contrast
Comparison: 10/26/2007.

CLINICAL DATA: Bilateral pneumonia.  Signal decubiti.

PORTABLE CHEST - 1 VIEW

[view not recorded (1 of 2)]
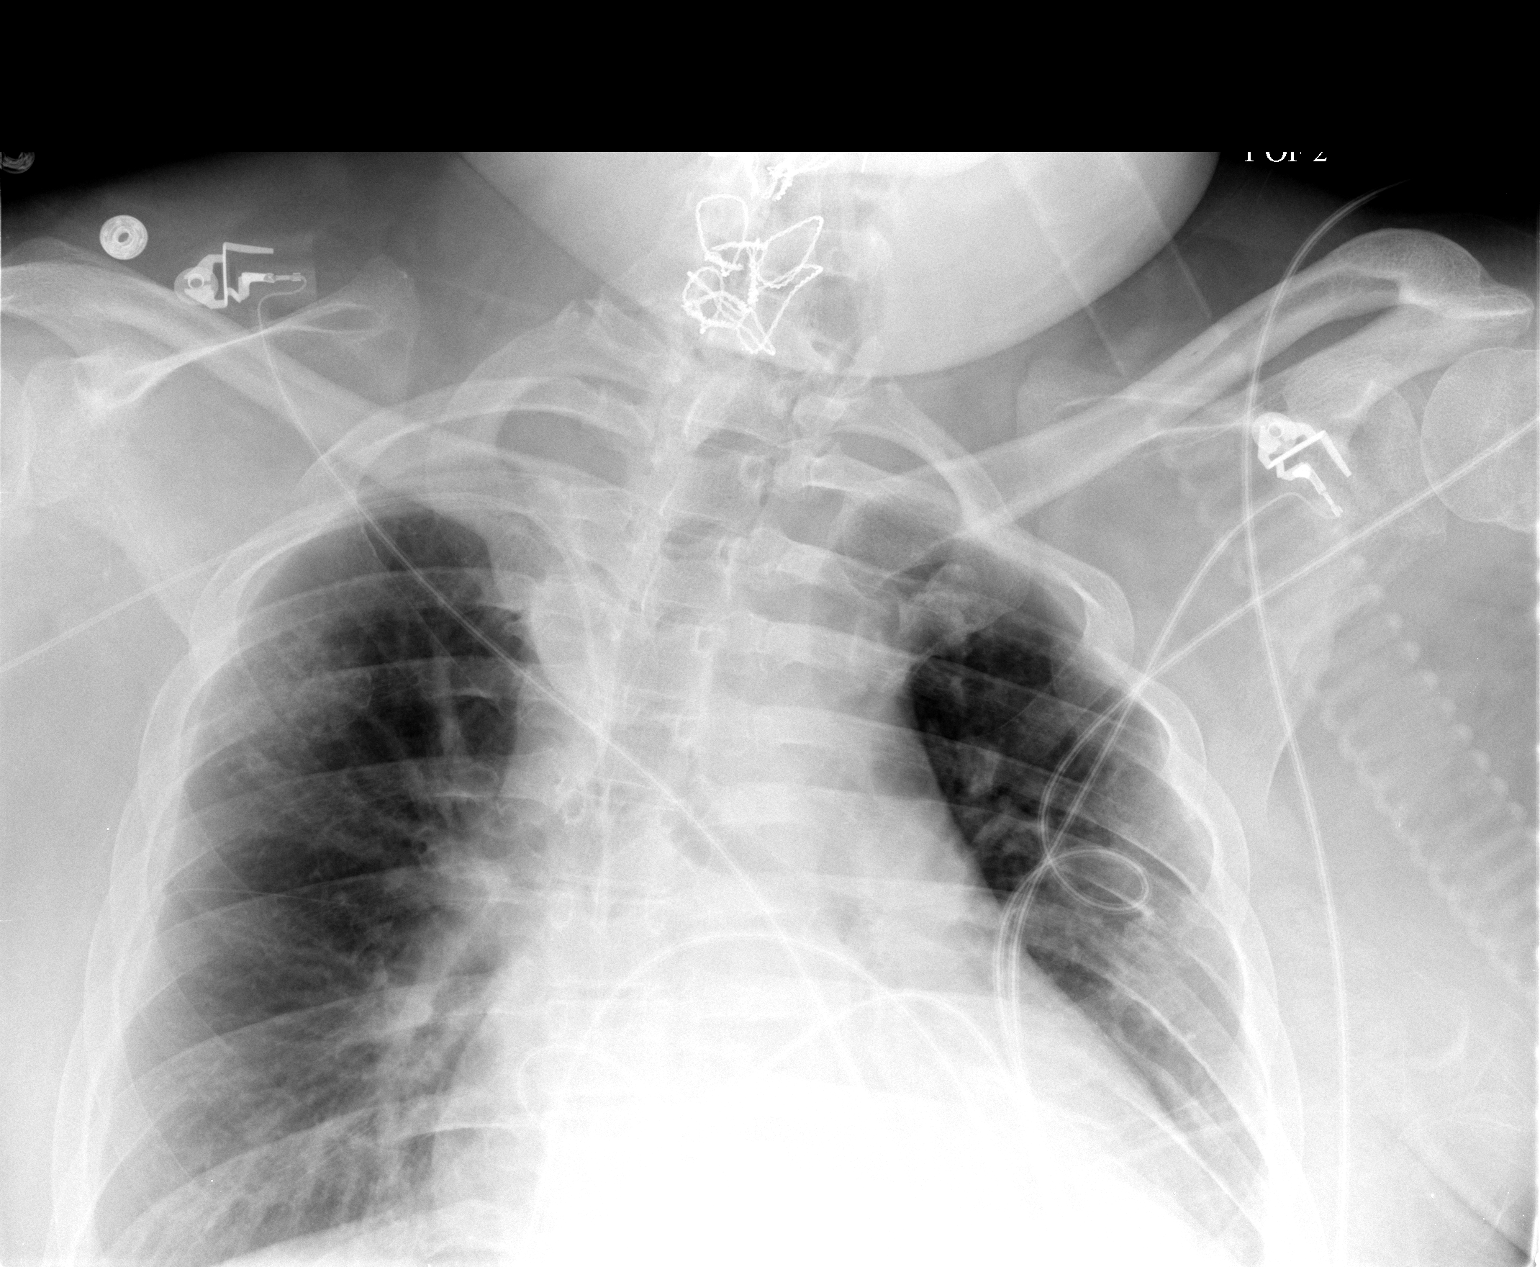

[view not recorded (2 of 2)]
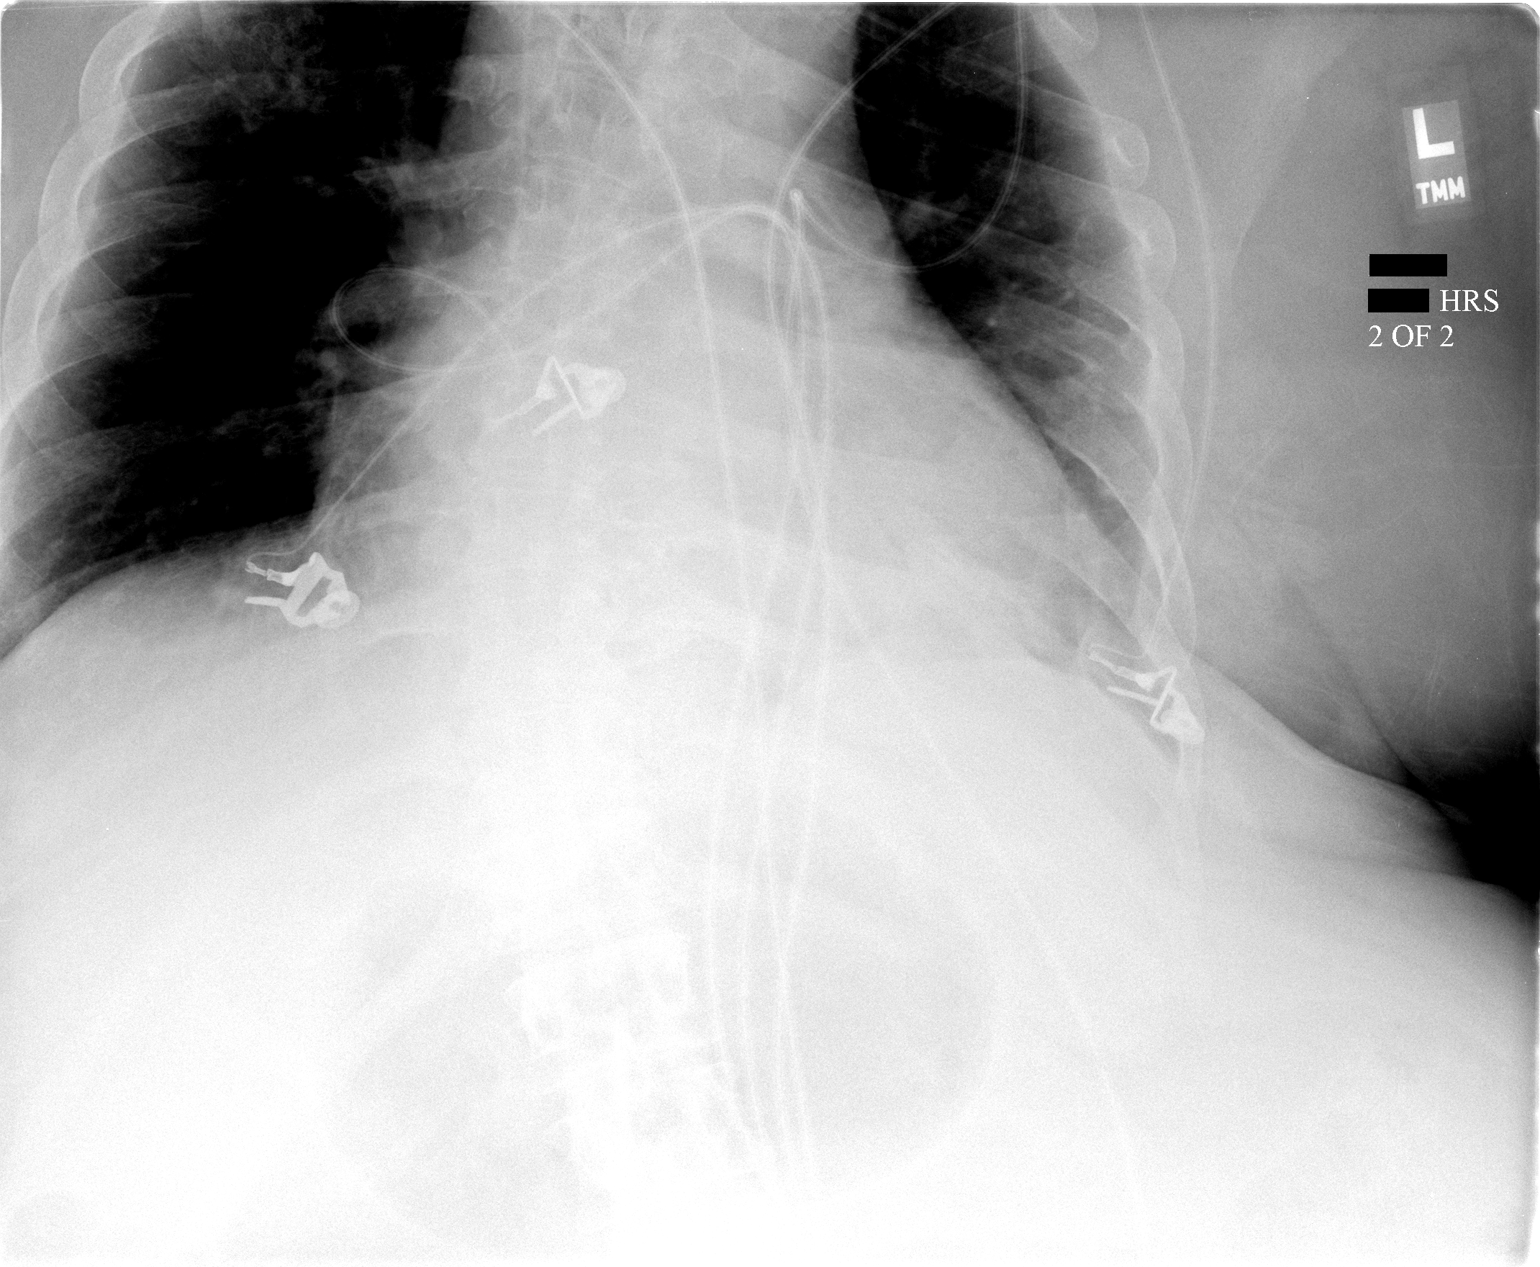

[2 of 2 positions shown; findings below may reference images not displayed]

FINDINGS: Right PICC line tip distal superior vena cava.  Biapical
pleural thickening right greater left stable.  Postsurgical changes
cervical spine.  No gross pneumothorax.  Cardiomegaly and central
pulmonary vascular prominence.

Interval improved aeration left base with persistent left base
subsegmental atelectasis/small infiltrate.
IMPRESSION: Interval improved aeration left base.  Mild subsegmental
atelectasis versus small left base infiltrate remains.

Cardiomegaly and central vessel prominence.

## 2010-06-15 NOTE — Miscellaneous (Signed)
Summary: Orders Update  Clinical Lists Changes  Problems: Added new problem of PVD (ICD-443.9) Orders: Added new Test order of Arterial Duplex Lower Extremity (Arterial Duplex Low) - Signed 

## 2010-06-28 ENCOUNTER — Encounter (HOSPITAL_BASED_OUTPATIENT_CLINIC_OR_DEPARTMENT_OTHER): Payer: Medicare Other | Attending: Internal Medicine

## 2010-06-28 ENCOUNTER — Emergency Department (HOSPITAL_COMMUNITY)
Admission: EM | Admit: 2010-06-28 | Discharge: 2010-06-28 | Disposition: A | Discharge status: Home / Self Care | Payer: Medicare Other | Attending: Emergency Medicine | Admitting: Emergency Medicine

## 2010-06-28 ENCOUNTER — Ambulatory Visit (HOSPITAL_BASED_OUTPATIENT_CLINIC_OR_DEPARTMENT_OTHER): Payer: Self-pay

## 2010-06-28 DIAGNOSIS — N390 Urinary tract infection, site not specified: Secondary | ICD-10-CM | POA: Insufficient documentation

## 2010-06-28 DIAGNOSIS — L89109 Pressure ulcer of unspecified part of back, unspecified stage: Secondary | ICD-10-CM | POA: Insufficient documentation

## 2010-06-28 DIAGNOSIS — Z933 Colostomy status: Secondary | ICD-10-CM | POA: Insufficient documentation

## 2010-06-28 DIAGNOSIS — G8250 Quadriplegia, unspecified: Secondary | ICD-10-CM | POA: Insufficient documentation

## 2010-06-28 DIAGNOSIS — E669 Obesity, unspecified: Secondary | ICD-10-CM | POA: Insufficient documentation

## 2010-06-28 DIAGNOSIS — IMO0002 Reserved for concepts with insufficient information to code with codable children: Secondary | ICD-10-CM | POA: Insufficient documentation

## 2010-06-28 DIAGNOSIS — I10 Essential (primary) hypertension: Secondary | ICD-10-CM | POA: Insufficient documentation

## 2010-06-28 DIAGNOSIS — G40909 Epilepsy, unspecified, not intractable, without status epilepticus: Secondary | ICD-10-CM | POA: Insufficient documentation

## 2010-06-28 DIAGNOSIS — L8993 Pressure ulcer of unspecified site, stage 3: Secondary | ICD-10-CM | POA: Insufficient documentation

## 2010-06-28 LAB — URINALYSIS, ROUTINE W REFLEX MICROSCOPIC
Protein, ur: NEGATIVE mg/dL
Urine Glucose, Fasting: NEGATIVE mg/dL
pH: 5.5 (ref 5.0–8.0)

## 2010-06-28 LAB — POCT I-STAT, CHEM 8
HCT: 32 % — ABNORMAL LOW (ref 39.0–52.0)
Hemoglobin: 10.9 g/dL — ABNORMAL LOW (ref 13.0–17.0)
Sodium: 134 mEq/L — ABNORMAL LOW (ref 135–145)
TCO2: 23 mmol/L (ref 0–100)

## 2010-06-28 LAB — DIFFERENTIAL
Basophils Absolute: 0 10*3/uL (ref 0.0–0.1)
Basophils Relative: 0 % (ref 0–1)
Neutro Abs: 8.4 10*3/uL — ABNORMAL HIGH (ref 1.7–7.7)
Neutrophils Relative %: 65 % (ref 43–77)

## 2010-06-28 LAB — URINE MICROSCOPIC-ADD ON

## 2010-06-28 LAB — CBC
Hemoglobin: 9.8 g/dL — ABNORMAL LOW (ref 13.0–17.0)
RBC: 3.91 MIL/uL — ABNORMAL LOW (ref 4.22–5.81)

## 2010-06-30 LAB — URINE CULTURE: Culture  Setup Time: 201202140142

## 2010-07-19 ENCOUNTER — Encounter (HOSPITAL_BASED_OUTPATIENT_CLINIC_OR_DEPARTMENT_OTHER): Payer: Medicare Other | Attending: Internal Medicine

## 2010-07-19 DIAGNOSIS — G8250 Quadriplegia, unspecified: Secondary | ICD-10-CM | POA: Insufficient documentation

## 2010-07-19 DIAGNOSIS — IMO0002 Reserved for concepts with insufficient information to code with codable children: Secondary | ICD-10-CM | POA: Insufficient documentation

## 2010-07-19 DIAGNOSIS — E669 Obesity, unspecified: Secondary | ICD-10-CM | POA: Insufficient documentation

## 2010-07-19 DIAGNOSIS — L8993 Pressure ulcer of unspecified site, stage 3: Secondary | ICD-10-CM | POA: Insufficient documentation

## 2010-07-19 DIAGNOSIS — L89109 Pressure ulcer of unspecified part of back, unspecified stage: Secondary | ICD-10-CM | POA: Insufficient documentation

## 2010-07-30 LAB — BASIC METABOLIC PANEL
BUN: 7 mg/dL (ref 6–23)
Calcium: 8.6 mg/dL (ref 8.4–10.5)
Creatinine, Ser: <0.3 mg/dL — ABNORMAL LOW (ref 0.4–1.5)

## 2010-07-30 LAB — DIFFERENTIAL
Basophils Absolute: 0 10*3/uL (ref 0.0–0.1)
Eosinophils Absolute: 0.3 10*3/uL (ref 0.0–0.7)
Eosinophils Relative: 5 % (ref 0–5)
Lymphocytes Relative: 27 % (ref 12–46)
Lymphs Abs: 1.7 10*3/uL (ref 0.7–4.0)
Neutrophils Relative %: 57 % (ref 43–77)

## 2010-07-30 LAB — CBC
Platelets: 319 10*3/uL (ref 150–400)
RBC: 3.6 MIL/uL — ABNORMAL LOW (ref 4.22–5.81)
RDW: 15.7 % — ABNORMAL HIGH (ref 11.5–15.5)
WBC: 6.3 10*3/uL (ref 4.0–10.5)

## 2010-07-31 LAB — PROTIME-INR
INR: 1.26 (ref 0.00–1.49)
Prothrombin Time: 15.7 seconds — ABNORMAL HIGH (ref 11.6–15.2)

## 2010-07-31 LAB — CBC
HCT: 28.3 % — ABNORMAL LOW (ref 39.0–52.0)
HCT: 29.8 % — ABNORMAL LOW (ref 39.0–52.0)
HCT: 37.1 % — ABNORMAL LOW (ref 39.0–52.0)
MCH: 26.5 pg (ref 26.0–34.0)
MCHC: 32.4 g/dL (ref 30.0–36.0)
MCHC: 32.6 g/dL (ref 30.0–36.0)
MCV: 80.3 fL (ref 78.0–100.0)
MCV: 81.4 fL (ref 78.0–100.0)
Platelets: 319 10*3/uL (ref 150–400)
Platelets: 434 10*3/uL — ABNORMAL HIGH (ref 150–400)
RBC: 4.61 MIL/uL (ref 4.22–5.81)
RDW: 16.1 % — ABNORMAL HIGH (ref 11.5–15.5)
RDW: 16.1 % — ABNORMAL HIGH (ref 11.5–15.5)
RDW: 16.5 % — ABNORMAL HIGH (ref 11.5–15.5)
WBC: 15.3 10*3/uL — ABNORMAL HIGH (ref 4.0–10.5)
WBC: 7.6 10*3/uL (ref 4.0–10.5)

## 2010-07-31 LAB — COMPREHENSIVE METABOLIC PANEL
ALT: 17 U/L (ref 0–53)
Albumin: 2.8 g/dL — ABNORMAL LOW (ref 3.5–5.2)
BUN: 18 mg/dL (ref 6–23)
Calcium: 8.5 mg/dL (ref 8.4–10.5)
Glucose, Bld: 145 mg/dL — ABNORMAL HIGH (ref 70–99)
Sodium: 141 mEq/L (ref 135–145)
Total Protein: 6.9 g/dL (ref 6.0–8.3)

## 2010-07-31 LAB — CULTURE, BLOOD (ROUTINE X 2)

## 2010-07-31 LAB — URINE CULTURE: Colony Count: >=100000

## 2010-07-31 LAB — POCT I-STAT, CHEM 8
BUN: 22 mg/dL (ref 6–23)
Chloride: 112 mEq/L (ref 96–112)
Creatinine, Ser: 0.5 mg/dL (ref 0.4–1.5)
Potassium: 4.3 mEq/L (ref 3.5–5.1)
Sodium: 143 mEq/L (ref 135–145)

## 2010-07-31 LAB — DIFFERENTIAL
Basophils Absolute: 0 10*3/uL (ref 0.0–0.1)
Basophils Absolute: 0 10*3/uL (ref 0.0–0.1)
Eosinophils Relative: 0 % (ref 0–5)
Eosinophils Relative: 5 % (ref 0–5)
Lymphocytes Relative: 17 % (ref 12–46)
Lymphocytes Relative: 9 % — ABNORMAL LOW (ref 12–46)
Lymphs Abs: 1.4 10*3/uL (ref 0.7–4.0)
Lymphs Abs: 1.7 10*3/uL (ref 0.7–4.0)
Monocytes Absolute: 1 10*3/uL (ref 0.1–1.0)
Monocytes Relative: 8 % (ref 3–12)
Neutro Abs: 10.2 10*3/uL — ABNORMAL HIGH (ref 1.7–7.7)
Neutro Abs: 12.8 10*3/uL — ABNORMAL HIGH (ref 1.7–7.7)
Neutrophils Relative %: 78 % — ABNORMAL HIGH (ref 43–77)

## 2010-07-31 LAB — URINALYSIS, ROUTINE W REFLEX MICROSCOPIC
Glucose, UA: NEGATIVE mg/dL
Protein, ur: >300 mg/dL — AB
Specific Gravity, Urine: 1.015 (ref 1.005–1.030)
pH: 8 (ref 5.0–8.0)

## 2010-07-31 LAB — URINE MICROSCOPIC-ADD ON

## 2010-07-31 LAB — WOUND CULTURE

## 2010-07-31 LAB — LACTIC ACID, PLASMA: Lactic Acid, Venous: 2.9 mmol/L — ABNORMAL HIGH (ref 0.5–2.2)

## 2010-07-31 LAB — CARDIAC PANEL(CRET KIN+CKTOT+MB+TROPI)
CK, MB: 2 ng/mL (ref 0.3–4.0)
Relative Index: INVALID (ref 0.0–2.5)
Troponin I: <0.01 ng/mL (ref 0.00–0.06)

## 2010-07-31 LAB — APTT: aPTT: 32 seconds (ref 24–37)

## 2010-07-31 LAB — PHENYTOIN LEVEL, TOTAL: Phenytoin Lvl: 4.8 ug/mL — ABNORMAL LOW (ref 10.0–20.0)

## 2010-08-04 LAB — CBC
HCT: 24.4 % — ABNORMAL LOW (ref 39.0–52.0)
MCHC: 31.6 g/dL (ref 30.0–36.0)
MCV: 82 fL (ref 78.0–100.0)
Platelets: 416 10*3/uL — ABNORMAL HIGH (ref 150–400)
RBC: 2.97 MIL/uL — ABNORMAL LOW (ref 4.22–5.81)
WBC: 6.1 10*3/uL (ref 4.0–10.5)

## 2010-08-04 LAB — GLUCOSE, CAPILLARY
Glucose-Capillary: 123 mg/dL — ABNORMAL HIGH (ref 70–99)
Glucose-Capillary: 97 mg/dL (ref 70–99)

## 2010-08-08 LAB — CBC
HCT: 23.1 % — ABNORMAL LOW (ref 39.0–52.0)
HCT: 23.9 % — ABNORMAL LOW (ref 39.0–52.0)
HCT: 24.6 % — ABNORMAL LOW (ref 39.0–52.0)
HCT: 28.1 % — ABNORMAL LOW (ref 39.0–52.0)
HCT: 29.2 % — ABNORMAL LOW (ref 39.0–52.0)
Hemoglobin: 7.2 g/dL — ABNORMAL LOW (ref 13.0–17.0)
Hemoglobin: 7.4 g/dL — ABNORMAL LOW (ref 13.0–17.0)
Hemoglobin: 7.4 g/dL — ABNORMAL LOW (ref 13.0–17.0)
Hemoglobin: 7.5 g/dL — ABNORMAL LOW (ref 13.0–17.0)
Hemoglobin: 7.7 g/dL — ABNORMAL LOW (ref 13.0–17.0)
Hemoglobin: 7.9 g/dL — ABNORMAL LOW (ref 13.0–17.0)
Hemoglobin: 8.8 g/dL — ABNORMAL LOW (ref 13.0–17.0)
Hemoglobin: 9.3 g/dL — ABNORMAL LOW (ref 13.0–17.0)
MCHC: 31.3 g/dL (ref 30.0–36.0)
MCHC: 31.4 g/dL (ref 30.0–36.0)
MCHC: 31.6 g/dL (ref 30.0–36.0)
MCHC: 31.7 g/dL (ref 30.0–36.0)
MCHC: 31.7 g/dL (ref 30.0–36.0)
MCHC: 32.1 g/dL (ref 30.0–36.0)
MCV: 80.6 fL (ref 78.0–100.0)
MCV: 81.1 fL (ref 78.0–100.0)
MCV: 81.4 fL (ref 78.0–100.0)
MCV: 81.5 fL (ref 78.0–100.0)
MCV: 81.5 fL (ref 78.0–100.0)
MCV: 81.5 fL (ref 78.0–100.0)
MCV: 81.7 fL (ref 78.0–100.0)
MCV: 82.7 fL (ref 78.0–100.0)
Platelets: 211 10*3/uL (ref 150–400)
Platelets: 212 10*3/uL (ref 150–400)
Platelets: 251 K/uL (ref 150–400)
Platelets: 282 10*3/uL (ref 150–400)
Platelets: 395 10*3/uL (ref 150–400)
Platelets: 451 10*3/uL — ABNORMAL HIGH (ref 150–400)
Platelets: 551 10*3/uL — ABNORMAL HIGH (ref 150–400)
RBC: 2.77 MIL/uL — ABNORMAL LOW (ref 4.22–5.81)
RBC: 2.9 MIL/uL — ABNORMAL LOW (ref 4.22–5.81)
RBC: 2.92 MIL/uL — ABNORMAL LOW (ref 4.22–5.81)
RBC: 2.97 MIL/uL — ABNORMAL LOW (ref 4.22–5.81)
RBC: 2.99 MIL/uL — ABNORMAL LOW (ref 4.22–5.81)
RBC: 3.01 MIL/uL — ABNORMAL LOW (ref 4.22–5.81)
RBC: 3.12 MIL/uL — ABNORMAL LOW (ref 4.22–5.81)
RBC: 3.45 MIL/uL — ABNORMAL LOW (ref 4.22–5.81)
RBC: 3.6 MIL/uL — ABNORMAL LOW (ref 4.22–5.81)
RDW: 14.5 % (ref 11.5–15.5)
RDW: 14.6 % (ref 11.5–15.5)
RDW: 15.3 % (ref 11.5–15.5)
RDW: 15.4 % (ref 11.5–15.5)
RDW: 16.1 % — ABNORMAL HIGH (ref 11.5–15.5)
RDW: 16.1 % — ABNORMAL HIGH (ref 11.5–15.5)
RDW: 16.2 % — ABNORMAL HIGH (ref 11.5–15.5)
RDW: 16.2 % — ABNORMAL HIGH (ref 11.5–15.5)
RDW: 16.4 % — ABNORMAL HIGH (ref 11.5–15.5)
WBC: 4.3 10*3/uL (ref 4.0–10.5)
WBC: 5.3 10*3/uL (ref 4.0–10.5)
WBC: 6.3 10*3/uL (ref 4.0–10.5)
WBC: 6.6 10*3/uL (ref 4.0–10.5)
WBC: 6.8 10*3/uL (ref 4.0–10.5)
WBC: 6.8 10*3/uL (ref 4.0–10.5)
WBC: 6.9 10*3/uL (ref 4.0–10.5)
WBC: 7.5 10*3/uL (ref 4.0–10.5)
WBC: 8.3 10*3/uL (ref 4.0–10.5)
WBC: 8.5 10*3/uL (ref 4.0–10.5)
WBC: 8.5 10*3/uL (ref 4.0–10.5)
WBC: 8.5 10*3/uL (ref 4.0–10.5)
WBC: 8.6 10*3/uL (ref 4.0–10.5)

## 2010-08-08 LAB — BLOOD GAS, ARTERIAL
Acid-Base Excess: 1.7 mmol/L (ref 0.0–2.0)
Acid-base deficit: 0.6 mmol/L (ref 0.0–2.0)
Acid-base deficit: 1.3 mmol/L (ref 0.0–2.0)
Bicarbonate: 23.9 mEq/L (ref 20.0–24.0)
Bicarbonate: 25.6 mEq/L — ABNORMAL HIGH (ref 20.0–24.0)
Bicarbonate: 26.7 mEq/L — ABNORMAL HIGH (ref 20.0–24.0)
Bicarbonate: 32.8 mEq/L — ABNORMAL HIGH (ref 20.0–24.0)
Bicarbonate: 32.8 mEq/L — ABNORMAL HIGH (ref 20.0–24.0)
Drawn by: 145321
Drawn by: 246861
Drawn by: 308601
FIO2: 0.4 %
FIO2: 0.4 %
FIO2: 0.5 %
MECHVT: 550 mL
MECHVT: 550 mL
O2 Content: 100 L/min
O2 Saturation: 86 %
O2 Saturation: 93.7 %
O2 Saturation: 95.9 %
O2 Saturation: 97.4 %
PEEP: 5 cmH2O
PEEP: 5 cmH2O
Patient temperature: 98.2
Patient temperature: 98.6
Patient temperature: 98.6
Patient temperature: 98.6
Patient temperature: 98.6
Patient temperature: 98.6
RATE: 14 resp/min
RATE: 14 resp/min
RATE: 14 resp/min
TCO2: 23.1 mmol/L (ref 0–100)
TCO2: 23.6 mmol/L (ref 0–100)
TCO2: 25.8 mmol/L (ref 0–100)
TCO2: 25.8 mmol/L (ref 0–100)
TCO2: 30 mmol/L (ref 0–100)
pCO2 arterial: 41.5 mmHg (ref 35.0–45.0)
pCO2 arterial: 45.6 mmHg — ABNORMAL HIGH (ref 35.0–45.0)
pCO2 arterial: 47.6 mmHg — ABNORMAL HIGH (ref 35.0–45.0)
pCO2 arterial: 70.7 mmHg (ref 35.0–45.0)
pH, Arterial: 7.155 — CL (ref 7.350–7.450)
pH, Arterial: 7.368 (ref 7.350–7.450)
pH, Arterial: 7.39 (ref 7.350–7.450)
pH, Arterial: 7.435 (ref 7.350–7.450)
pH, Arterial: 7.451 — ABNORMAL HIGH (ref 7.350–7.450)
pO2, Arterial: 52.4 mmHg — ABNORMAL LOW (ref 80.0–100.0)
pO2, Arterial: 58.1 mmHg — ABNORMAL LOW (ref 80.0–100.0)
pO2, Arterial: 64.6 mmHg — ABNORMAL LOW (ref 80.0–100.0)
pO2, Arterial: 72.3 mmHg — ABNORMAL LOW (ref 80.0–100.0)

## 2010-08-08 LAB — CARDIAC PANEL(CRET KIN+CKTOT+MB+TROPI)
CK, MB: 1.6 ng/mL (ref 0.3–4.0)
CK, MB: 3.5 ng/mL (ref 0.3–4.0)
Relative Index: 0.8 (ref 0.0–2.5)
Relative Index: 0.9 (ref 0.0–2.5)
Total CK: 252 U/L — ABNORMAL HIGH (ref 7–232)
Total CK: 388 U/L — ABNORMAL HIGH (ref 7–232)
Troponin I: 0.01 ng/mL (ref 0.00–0.06)

## 2010-08-08 LAB — GLUCOSE, CAPILLARY
Glucose-Capillary: 100 mg/dL — ABNORMAL HIGH (ref 70–99)
Glucose-Capillary: 103 mg/dL — ABNORMAL HIGH (ref 70–99)
Glucose-Capillary: 105 mg/dL — ABNORMAL HIGH (ref 70–99)
Glucose-Capillary: 106 mg/dL — ABNORMAL HIGH (ref 70–99)
Glucose-Capillary: 109 mg/dL — ABNORMAL HIGH (ref 70–99)
Glucose-Capillary: 109 mg/dL — ABNORMAL HIGH (ref 70–99)
Glucose-Capillary: 113 mg/dL — ABNORMAL HIGH (ref 70–99)
Glucose-Capillary: 114 mg/dL — ABNORMAL HIGH (ref 70–99)
Glucose-Capillary: 117 mg/dL — ABNORMAL HIGH (ref 70–99)
Glucose-Capillary: 117 mg/dL — ABNORMAL HIGH (ref 70–99)
Glucose-Capillary: 119 mg/dL — ABNORMAL HIGH (ref 70–99)
Glucose-Capillary: 121 mg/dL — ABNORMAL HIGH (ref 70–99)
Glucose-Capillary: 122 mg/dL — ABNORMAL HIGH (ref 70–99)
Glucose-Capillary: 125 mg/dL — ABNORMAL HIGH (ref 70–99)
Glucose-Capillary: 125 mg/dL — ABNORMAL HIGH (ref 70–99)
Glucose-Capillary: 126 mg/dL — ABNORMAL HIGH (ref 70–99)
Glucose-Capillary: 126 mg/dL — ABNORMAL HIGH (ref 70–99)
Glucose-Capillary: 127 mg/dL — ABNORMAL HIGH (ref 70–99)
Glucose-Capillary: 129 mg/dL — ABNORMAL HIGH (ref 70–99)
Glucose-Capillary: 133 mg/dL — ABNORMAL HIGH (ref 70–99)
Glucose-Capillary: 137 mg/dL — ABNORMAL HIGH (ref 70–99)
Glucose-Capillary: 138 mg/dL — ABNORMAL HIGH (ref 70–99)
Glucose-Capillary: 139 mg/dL — ABNORMAL HIGH (ref 70–99)
Glucose-Capillary: 150 mg/dL — ABNORMAL HIGH (ref 70–99)
Glucose-Capillary: 152 mg/dL — ABNORMAL HIGH (ref 70–99)
Glucose-Capillary: 152 mg/dL — ABNORMAL HIGH (ref 70–99)
Glucose-Capillary: 164 mg/dL — ABNORMAL HIGH (ref 70–99)
Glucose-Capillary: 183 mg/dL — ABNORMAL HIGH (ref 70–99)
Glucose-Capillary: 86 mg/dL (ref 70–99)
Glucose-Capillary: 92 mg/dL (ref 70–99)
Glucose-Capillary: 94 mg/dL (ref 70–99)
Glucose-Capillary: 97 mg/dL (ref 70–99)
Glucose-Capillary: 97 mg/dL (ref 70–99)
Glucose-Capillary: 99 mg/dL (ref 70–99)
Glucose-Capillary: 99 mg/dL (ref 70–99)

## 2010-08-08 LAB — BASIC METABOLIC PANEL
BUN: 12 mg/dL (ref 6–23)
BUN: 6 mg/dL (ref 6–23)
BUN: 7 mg/dL (ref 6–23)
CO2: 24 mEq/L (ref 19–32)
CO2: 25 mEq/L (ref 19–32)
CO2: 34 mEq/L — ABNORMAL HIGH (ref 19–32)
Calcium: 7.6 mg/dL — ABNORMAL LOW (ref 8.4–10.5)
Calcium: 7.8 mg/dL — ABNORMAL LOW (ref 8.4–10.5)
Calcium: 8 mg/dL — ABNORMAL LOW (ref 8.4–10.5)
Calcium: 8.3 mg/dL — ABNORMAL LOW (ref 8.4–10.5)
Calcium: 8.3 mg/dL — ABNORMAL LOW (ref 8.4–10.5)
Calcium: 8.4 mg/dL (ref 8.4–10.5)
Calcium: 8.7 mg/dL (ref 8.4–10.5)
Calcium: 8.8 mg/dL (ref 8.4–10.5)
Chloride: 101 mEq/L (ref 96–112)
Chloride: 103 mEq/L (ref 96–112)
Chloride: 105 mEq/L (ref 96–112)
Chloride: 106 mEq/L (ref 96–112)
Chloride: 111 mEq/L (ref 96–112)
Chloride: 114 mEq/L — ABNORMAL HIGH (ref 96–112)
Creatinine, Ser: 0.4 mg/dL (ref 0.4–1.5)
Creatinine, Ser: 0.73 mg/dL (ref 0.4–1.5)
Creatinine, Ser: 0.78 mg/dL (ref 0.4–1.5)
GFR calc Af Amer: >60 mL/min (ref >60)
GFR calc Af Amer: >60 mL/min (ref >60)
GFR calc Af Amer: >60 mL/min (ref >60)
GFR calc Af Amer: >60 mL/min (ref >60)
GFR calc Af Amer: >60 mL/min (ref >60)
GFR calc Af Amer: >60 mL/min (ref >60)
GFR calc non Af Amer: >60 mL/min (ref >60)
GFR calc non Af Amer: >60 mL/min (ref >60)
GFR calc non Af Amer: >60 mL/min (ref >60)
GFR calc non Af Amer: >60 mL/min (ref >60)
GFR calc non Af Amer: >60 mL/min (ref >60)
GFR calc non Af Amer: >60 mL/min (ref >60)
Glucose, Bld: 117 mg/dL — ABNORMAL HIGH (ref 70–99)
Glucose, Bld: 137 mg/dL — ABNORMAL HIGH (ref 70–99)
Glucose, Bld: 175 mg/dL — ABNORMAL HIGH (ref 70–99)
Glucose, Bld: 92 mg/dL (ref 70–99)
Potassium: 3.7 mEq/L (ref 3.5–5.1)
Potassium: 4.1 mEq/L (ref 3.5–5.1)
Potassium: 4.3 mEq/L (ref 3.5–5.1)
Potassium: 5.1 mEq/L (ref 3.5–5.1)
Sodium: 135 mEq/L (ref 135–145)
Sodium: 140 mEq/L (ref 135–145)
Sodium: 140 mEq/L (ref 135–145)
Sodium: 140 mEq/L (ref 135–145)
Sodium: 141 mEq/L (ref 135–145)
Sodium: 142 mEq/L (ref 135–145)
Sodium: 142 mEq/L (ref 135–145)
Sodium: 149 mEq/L — ABNORMAL HIGH (ref 135–145)

## 2010-08-08 LAB — COMPREHENSIVE METABOLIC PANEL
ALT: 27 U/L (ref 0–53)
ALT: 33 U/L (ref 0–53)
ALT: 38 U/L (ref 0–53)
AST: 20 U/L (ref 0–37)
AST: 22 U/L (ref 0–37)
AST: 23 U/L (ref 0–37)
AST: 41 U/L — ABNORMAL HIGH (ref 0–37)
Albumin: 2.2 g/dL — ABNORMAL LOW (ref 3.5–5.2)
Albumin: 2.4 g/dL — ABNORMAL LOW (ref 3.5–5.2)
Albumin: 2.7 g/dL — ABNORMAL LOW (ref 3.5–5.2)
Alkaline Phosphatase: 88 U/L (ref 39–117)
BUN: 8 mg/dL (ref 6–23)
BUN: 9 mg/dL (ref 6–23)
CO2: 24 mEq/L (ref 19–32)
CO2: 26 mEq/L (ref 19–32)
CO2: 27 mEq/L (ref 19–32)
Calcium: 7.6 mg/dL — ABNORMAL LOW (ref 8.4–10.5)
Calcium: 8 mg/dL — ABNORMAL LOW (ref 8.4–10.5)
Calcium: 8.2 mg/dL — ABNORMAL LOW (ref 8.4–10.5)
Chloride: 108 mEq/L (ref 96–112)
Chloride: 112 mEq/L (ref 96–112)
Chloride: 116 mEq/L — ABNORMAL HIGH (ref 96–112)
Creatinine, Ser: 0.51 mg/dL (ref 0.4–1.5)
Creatinine, Ser: 0.76 mg/dL (ref 0.4–1.5)
Creatinine, Ser: 0.88 mg/dL (ref 0.4–1.5)
GFR calc Af Amer: >60 mL/min (ref >60)
GFR calc Af Amer: >60 mL/min (ref >60)
GFR calc Af Amer: >60 mL/min (ref >60)
GFR calc Af Amer: >60 mL/min (ref >60)
GFR calc non Af Amer: >60 mL/min (ref >60)
GFR calc non Af Amer: >60 mL/min (ref >60)
Glucose, Bld: 146 mg/dL — ABNORMAL HIGH (ref 70–99)
Potassium: 2.8 mEq/L — ABNORMAL LOW (ref 3.5–5.1)
Potassium: 3.3 mEq/L — ABNORMAL LOW (ref 3.5–5.1)
Sodium: 141 mEq/L (ref 135–145)
Sodium: 148 mEq/L — ABNORMAL HIGH (ref 135–145)
Total Bilirubin: 0.5 mg/dL (ref 0.3–1.2)
Total Bilirubin: 0.5 mg/dL (ref 0.3–1.2)
Total Protein: 6.3 g/dL (ref 6.0–8.3)
Total Protein: 6.7 g/dL (ref 6.0–8.3)

## 2010-08-08 LAB — DIFFERENTIAL
Basophils Absolute: 0 K/uL (ref 0.0–0.1)
Basophils Relative: 0 % (ref 0–1)
Basophils Relative: 0 % (ref 0–1)
Eosinophils Absolute: 0 10*3/uL (ref 0.0–0.7)
Eosinophils Relative: 1 % (ref 0–5)
Eosinophils Relative: 1 % (ref 0–5)
Lymphocytes Relative: 9 % — ABNORMAL LOW (ref 12–46)
Lymphocytes Relative: 9 % — ABNORMAL LOW (ref 12–46)
Lymphs Abs: 0.4 10*3/uL — ABNORMAL LOW (ref 0.7–4.0)
Lymphs Abs: 0.6 K/uL — ABNORMAL LOW (ref 0.7–4.0)
Monocytes Absolute: 0.7 K/uL (ref 0.1–1.0)
Monocytes Relative: 11 % (ref 3–12)
Monocytes Relative: 9 % (ref 3–12)
Neutro Abs: 3.4 10*3/uL (ref 1.7–7.7)
Neutro Abs: 5.7 10*3/uL (ref 1.7–7.7)
Neutrophils Relative %: 82 % — ABNORMAL HIGH (ref 43–77)

## 2010-08-08 LAB — CULTURE, BLOOD (ROUTINE X 2)
Culture: NO GROWTH
Culture: NO GROWTH

## 2010-08-08 LAB — BASIC METABOLIC PANEL WITH GFR
BUN: 10 mg/dL (ref 6–23)
Creatinine, Ser: 0.42 mg/dL (ref 0.4–1.5)
GFR calc non Af Amer: >60 mL/min (ref >60)
Glucose, Bld: 103 mg/dL — ABNORMAL HIGH (ref 70–99)
Potassium: 3.8 meq/L (ref 3.5–5.1)

## 2010-08-08 LAB — CLOSTRIDIUM DIFFICILE EIA
C difficile Toxins A+B, EIA: NEGATIVE
C difficile Toxins A+B, EIA: NEGATIVE

## 2010-08-08 LAB — WOUND CULTURE: Gram Stain: NONE SEEN

## 2010-08-08 LAB — URINE CULTURE
Colony Count: >=100000
Special Requests: NEGATIVE

## 2010-08-08 LAB — HEMOCCULT GUIAC POC 1CARD (OFFICE): Fecal Occult Bld: NEGATIVE

## 2010-08-08 LAB — LACTIC ACID, PLASMA: Lactic Acid, Venous: 1 mmol/L (ref 0.5–2.2)

## 2010-08-08 LAB — URINE MICROSCOPIC-ADD ON

## 2010-08-08 LAB — URINALYSIS, ROUTINE W REFLEX MICROSCOPIC
Bilirubin Urine: NEGATIVE
Glucose, UA: NEGATIVE mg/dL
Ketones, ur: NEGATIVE mg/dL
Nitrite: POSITIVE — AB
Protein, ur: 30 mg/dL — AB
Specific Gravity, Urine: 1.002 — ABNORMAL LOW (ref 1.005–1.030)
Urobilinogen, UA: 0.2 mg/dL (ref 0.0–1.0)
pH: 8.5 — ABNORMAL HIGH (ref 5.0–8.0)

## 2010-08-08 LAB — PHOSPHORUS: Phosphorus: 2.8 mg/dL (ref 2.3–4.6)

## 2010-08-08 LAB — MAGNESIUM
Magnesium: 1.8 mg/dL (ref 1.5–2.5)
Magnesium: 1.9 mg/dL (ref 1.5–2.5)

## 2010-08-08 LAB — MRSA PCR SCREENING: MRSA by PCR: NEGATIVE

## 2010-08-08 LAB — POTASSIUM: Potassium: 3.4 mEq/L — ABNORMAL LOW (ref 3.5–5.1)

## 2010-08-08 LAB — PHENYTOIN LEVEL, TOTAL
Phenytoin Lvl: 7.4 ug/mL — ABNORMAL LOW (ref 10.0–20.0)
Phenytoin Lvl: 8.6 ug/mL — ABNORMAL LOW (ref 10.0–20.0)

## 2010-08-08 LAB — VANCOMYCIN, TROUGH: Vancomycin Tr: 35.6 ug/mL (ref 10.0–20.0)

## 2010-08-08 LAB — CULTURE, BAL-QUANTITATIVE W GRAM STAIN: Colony Count: NO GROWTH

## 2010-08-23 ENCOUNTER — Encounter (HOSPITAL_BASED_OUTPATIENT_CLINIC_OR_DEPARTMENT_OTHER): Payer: Medicare Other | Attending: Internal Medicine

## 2010-08-23 DIAGNOSIS — G8250 Quadriplegia, unspecified: Secondary | ICD-10-CM | POA: Insufficient documentation

## 2010-08-23 DIAGNOSIS — I10 Essential (primary) hypertension: Secondary | ICD-10-CM | POA: Insufficient documentation

## 2010-08-23 DIAGNOSIS — L98499 Non-pressure chronic ulcer of skin of other sites with unspecified severity: Secondary | ICD-10-CM | POA: Insufficient documentation

## 2010-08-31 LAB — BASIC METABOLIC PANEL
BUN: 11 mg/dL (ref 6–23)
BUN: 29 mg/dL — ABNORMAL HIGH (ref 6–23)
BUN: 8 mg/dL (ref 6–23)
CO2: 24 mEq/L (ref 19–32)
CO2: 26 mEq/L (ref 19–32)
Calcium: 8.9 mg/dL (ref 8.4–10.5)
Calcium: 9 mg/dL (ref 8.4–10.5)
Chloride: 105 mEq/L (ref 96–112)
Chloride: 106 mEq/L (ref 96–112)
Chloride: 110 mEq/L (ref 96–112)
Chloride: 111 mEq/L (ref 96–112)
Creatinine, Ser: 0.38 mg/dL — ABNORMAL LOW (ref 0.4–1.5)
Creatinine, Ser: 0.38 mg/dL — ABNORMAL LOW (ref 0.4–1.5)
Creatinine, Ser: 0.44 mg/dL (ref 0.4–1.5)
GFR calc Af Amer: >60 mL/min (ref >60)
GFR calc Af Amer: >60 mL/min (ref >60)
GFR calc Af Amer: >60 mL/min (ref >60)
GFR calc Af Amer: >60 mL/min (ref >60)
GFR calc non Af Amer: >60 mL/min (ref >60)
GFR calc non Af Amer: >60 mL/min (ref >60)
Glucose, Bld: 119 mg/dL — ABNORMAL HIGH (ref 70–99)
Potassium: 3.5 mEq/L (ref 3.5–5.1)
Potassium: 4 mEq/L (ref 3.5–5.1)
Potassium: 4 mEq/L (ref 3.5–5.1)
Sodium: 139 mEq/L (ref 135–145)
Sodium: 140 mEq/L (ref 135–145)

## 2010-08-31 LAB — CBC
HCT: 27.5 % — ABNORMAL LOW (ref 39.0–52.0)
HCT: 27.9 % — ABNORMAL LOW (ref 39.0–52.0)
HCT: 34.4 % — ABNORMAL LOW (ref 39.0–52.0)
Hemoglobin: 10.3 g/dL — ABNORMAL LOW (ref 13.0–17.0)
MCHC: 32.6 g/dL (ref 30.0–36.0)
MCHC: 32.8 g/dL (ref 30.0–36.0)
MCHC: 32.8 g/dL (ref 30.0–36.0)
MCV: 82.2 fL (ref 78.0–100.0)
MCV: 82.3 fL (ref 78.0–100.0)
MCV: 82.4 fL (ref 78.0–100.0)
MCV: 82.4 fL (ref 78.0–100.0)
Platelets: 264 10*3/uL (ref 150–400)
Platelets: 334 10*3/uL (ref 150–400)
RBC: 3.35 MIL/uL — ABNORMAL LOW (ref 4.22–5.81)
RBC: 3.38 MIL/uL — ABNORMAL LOW (ref 4.22–5.81)
RBC: 3.45 MIL/uL — ABNORMAL LOW (ref 4.22–5.81)
RBC: 3.85 MIL/uL — ABNORMAL LOW (ref 4.22–5.81)
RDW: 15.3 % (ref 11.5–15.5)
RDW: 15.8 % — ABNORMAL HIGH (ref 11.5–15.5)
WBC: 11.9 10*3/uL — ABNORMAL HIGH (ref 4.0–10.5)
WBC: 12.1 10*3/uL — ABNORMAL HIGH (ref 4.0–10.5)
WBC: 13.6 10*3/uL — ABNORMAL HIGH (ref 4.0–10.5)
WBC: 16.5 10*3/uL — ABNORMAL HIGH (ref 4.0–10.5)

## 2010-08-31 LAB — URINE MICROSCOPIC-ADD ON

## 2010-08-31 LAB — URINALYSIS, ROUTINE W REFLEX MICROSCOPIC
Glucose, UA: NEGATIVE mg/dL
Specific Gravity, Urine: 1.017 (ref 1.005–1.030)

## 2010-08-31 LAB — DIFFERENTIAL
Basophils Relative: 0 % (ref 0–1)
Eosinophils Absolute: 0.2 10*3/uL (ref 0.0–0.7)
Monocytes Absolute: 1.7 10*3/uL — ABNORMAL HIGH (ref 0.1–1.0)
Monocytes Relative: 10 % (ref 3–12)
Neutro Abs: 13 10*3/uL — ABNORMAL HIGH (ref 1.7–7.7)

## 2010-08-31 LAB — POCT I-STAT, CHEM 8
Creatinine, Ser: 0.8 mg/dL (ref 0.4–1.5)
Glucose, Bld: 115 mg/dL — ABNORMAL HIGH (ref 70–99)
Hemoglobin: 12.6 g/dL — ABNORMAL LOW (ref 13.0–17.0)
Potassium: 4 mEq/L (ref 3.5–5.1)

## 2010-08-31 LAB — COMPREHENSIVE METABOLIC PANEL
ALT: 19 U/L (ref 0–53)
Alkaline Phosphatase: 78 U/L (ref 39–117)
CO2: 24 mEq/L (ref 19–32)
Calcium: 9 mg/dL (ref 8.4–10.5)
Chloride: 108 mEq/L (ref 96–112)
GFR calc non Af Amer: >60 mL/min (ref >60)
Glucose, Bld: 136 mg/dL — ABNORMAL HIGH (ref 70–99)
Potassium: 4.1 mEq/L (ref 3.5–5.1)
Sodium: 140 mEq/L (ref 135–145)
Total Bilirubin: 0.4 mg/dL (ref 0.3–1.2)

## 2010-08-31 LAB — CULTURE, BLOOD (ROUTINE X 2): Culture: NO GROWTH

## 2010-08-31 LAB — POCT CARDIAC MARKERS: CKMB, poc: 1.3 ng/mL (ref 1.0–8.0)

## 2010-08-31 LAB — URINE CULTURE

## 2010-09-27 ENCOUNTER — Encounter (HOSPITAL_BASED_OUTPATIENT_CLINIC_OR_DEPARTMENT_OTHER): Payer: Medicare Other | Attending: Internal Medicine

## 2010-09-27 DIAGNOSIS — I10 Essential (primary) hypertension: Secondary | ICD-10-CM | POA: Insufficient documentation

## 2010-09-27 DIAGNOSIS — L98499 Non-pressure chronic ulcer of skin of other sites with unspecified severity: Secondary | ICD-10-CM | POA: Insufficient documentation

## 2010-09-27 DIAGNOSIS — G8250 Quadriplegia, unspecified: Secondary | ICD-10-CM | POA: Insufficient documentation

## 2010-09-28 ENCOUNTER — Emergency Department (HOSPITAL_COMMUNITY): Payer: Medicare Other

## 2010-09-28 ENCOUNTER — Inpatient Hospital Stay (HOSPITAL_COMMUNITY)
Admission: EM | Admit: 2010-09-28 | Discharge: 2010-10-05 | DRG: 177 | Disposition: A | Discharge status: Home / Self Care | Payer: Medicare Other | Attending: Internal Medicine | Admitting: Internal Medicine

## 2010-09-28 DIAGNOSIS — I10 Essential (primary) hypertension: Secondary | ICD-10-CM | POA: Diagnosis present

## 2010-09-28 DIAGNOSIS — L8994 Pressure ulcer of unspecified site, stage 4: Secondary | ICD-10-CM | POA: Diagnosis present

## 2010-09-28 DIAGNOSIS — L89109 Pressure ulcer of unspecified part of back, unspecified stage: Secondary | ICD-10-CM | POA: Diagnosis present

## 2010-09-28 DIAGNOSIS — G8250 Quadriplegia, unspecified: Secondary | ICD-10-CM | POA: Diagnosis present

## 2010-09-28 DIAGNOSIS — G4733 Obstructive sleep apnea (adult) (pediatric): Secondary | ICD-10-CM | POA: Diagnosis present

## 2010-09-28 DIAGNOSIS — E662 Morbid (severe) obesity with alveolar hypoventilation: Secondary | ICD-10-CM | POA: Diagnosis present

## 2010-09-28 DIAGNOSIS — Z6833 Body mass index (BMI) 33.0-33.9, adult: Secondary | ICD-10-CM

## 2010-09-28 DIAGNOSIS — D638 Anemia in other chronic diseases classified elsewhere: Secondary | ICD-10-CM | POA: Diagnosis present

## 2010-09-28 DIAGNOSIS — G40909 Epilepsy, unspecified, not intractable, without status epilepticus: Secondary | ICD-10-CM | POA: Diagnosis present

## 2010-09-28 DIAGNOSIS — J15212 Pneumonia due to Methicillin resistant Staphylococcus aureus: Principal | ICD-10-CM | POA: Diagnosis present

## 2010-09-28 DIAGNOSIS — Z7401 Bed confinement status: Secondary | ICD-10-CM

## 2010-09-28 DIAGNOSIS — B9689 Other specified bacterial agents as the cause of diseases classified elsewhere: Secondary | ICD-10-CM | POA: Diagnosis present

## 2010-09-28 DIAGNOSIS — J9610 Chronic respiratory failure, unspecified whether with hypoxia or hypercapnia: Secondary | ICD-10-CM | POA: Diagnosis present

## 2010-09-28 DIAGNOSIS — M8668 Other chronic osteomyelitis, other site: Secondary | ICD-10-CM | POA: Diagnosis present

## 2010-09-28 DIAGNOSIS — Z933 Colostomy status: Secondary | ICD-10-CM

## 2010-09-28 DIAGNOSIS — R0902 Hypoxemia: Secondary | ICD-10-CM | POA: Diagnosis present

## 2010-09-28 DIAGNOSIS — Z79899 Other long term (current) drug therapy: Secondary | ICD-10-CM

## 2010-09-28 LAB — DIFFERENTIAL
Basophils Absolute: 0 10*3/uL (ref 0.0–0.1)
Basophils Relative: 0 % (ref 0–1)
Lymphocytes Relative: 17 % (ref 12–46)
Monocytes Absolute: 1.4 10*3/uL — ABNORMAL HIGH (ref 0.1–1.0)
Neutro Abs: 11.8 10*3/uL — ABNORMAL HIGH (ref 1.7–7.7)
Neutrophils Relative %: 71 % (ref 43–77)

## 2010-09-28 LAB — CBC
HCT: 34.4 % — ABNORMAL LOW (ref 39.0–52.0)
Hemoglobin: 11 g/dL — ABNORMAL LOW (ref 13.0–17.0)
MCHC: 32 g/dL (ref 30.0–36.0)
RBC: 4.27 MIL/uL (ref 4.22–5.81)
WBC: 16.7 10*3/uL — ABNORMAL HIGH (ref 4.0–10.5)

## 2010-09-28 LAB — BASIC METABOLIC PANEL
CO2: 25 mEq/L (ref 19–32)
Calcium: 9.5 mg/dL (ref 8.4–10.5)
GFR calc Af Amer: >60 mL/min (ref >60)
Potassium: 4.3 mEq/L (ref 3.5–5.1)
Sodium: 136 mEq/L (ref 135–145)
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
## 2010-09-28 NOTE — H&P (Signed)
[name] [name], [name] [name] NO.:  [id number]  MEDICAL RECORD NO.:  [id number]           PATIENT TYPE:  LOCATION:                                 FACILITY:  PHYSICIAN:  Marinda Elk, M.D.DATE OF BIRTH:  02-28-1966  DATE OF ADMISSION: DATE OF DISCHARGE:                             HISTORY & PHYSICAL   PRIMARY CARE DOCTOR:  Robyn N. Allyne Gee, MD  CHIEF COMPLAINT:  Congestion and shortness of breath.  This is a 45 year old with past medical history of quadriplegia secondary to a fall.  Also, a past medical history of urinary tract infection with Proteus mirabilis and decubitus ulcer, just seen at the Wound Care, which cultures were done with a colostomy for wound protection, also history of staph bacteremia, obstructive sleep apnea, hypertension, who comes in for just not feeling well and short of breath and some cough.  He relates that he started coughing.  He does not use oxygen at home, but after the coughing, he started having some chest congestion.  Forty eight hours before coming into the hospital, he started developing a mild fever at home of 100.5.  He denied any chest pain, nausea, or vomiting.  This shortness of breath got worse to the point where he had a hard time even just lying in bed.  He does not have much mobility he relates, but he was short of breath in bed.  He relates that he feels some chest tightness and 1 day prior to admission, he started having diaphoresis at night.  So, he decided to come to the ED. He lives with a caregiver at home.  He relates this caregiver was sick last week.  PAST MEDICAL HISTORY: 1. Multiple urinary tract infections. 2. Stage IV decubitus ulcer. 3. Seizure disorder. 4. Obstructive sleep apnea. 5. Hypertension. 6. C5 fracture causing quadriplegia. 7. Normocytic anemia, probably anemia of chronic disease. 8. History of acute respiratory failure secondary to healthcare-     associated pneumonia with  intubation requiring at that time in     March. 9. Sepsis. 10.History of gastritis.  MEDICATIONS:  He is on: 1. Phenytoin extended release 300 mg every morning, 200 every night. 2. Docusate 1 tablet b.i.d. 3. Ferrous sulfate 1 tablet daily. 4. Vitamin C over the counter 1 tablet b.i.d. 5. Multivitamin 1 tablet daily. 6. Zinc 1 tablet b.i.d. 7. K-Dur 20 mEq daily. 8. Lasix 20 mg b.i.d. 9. Baclofen 20 mg 4 times a day. 10.Reglan 10 mg t.i.d.  FAMILY HISTORY:  Positive for breast cancer in his mother and diabetes in his father.  SOCIAL HISTORY:  He lives at home with his roommate.  He has a caregiver who comes in.  He is mostly bed-bound, but can mobilize with a wheelchair.  He has not been able to do this in last few days because of his weakness and shortness of breath.  He denies tobacco, alcohol, or drugs.  REVIEW OF SYSTEMS:  Ten-point review of systems done, pertinent positives per HPI.  PHYSICAL EXAMINATION:  VITAL SIGNS:  Temperature 99, pulse 106, blood pressure 115/61, he is sating 94% on 2 L,  breathing 22 times per minute. GENERAL:  He is awake, alert, and oriented x3, coherent and fluent language. HEENT:  Anicteric.  No pallor.  Head is atraumatic, normocephalic.  No jaundice. NECK:  No carotid bruit. CARDIOVASCULAR:  He has a regular rate and rhythm with positive S1, S2. No appreciated murmurs, rubs, or gallops. LUNGS:  He has good air movement, mild rhonchi bilaterally and on the right lower lobe, he has some crackles and some rhonchus.  ABDOMEN: Positive bowel sounds, nontender, nondistended, soft. EXTREMITIES:  Positive pulses, extremely thin.  No edema.  He has a stage IV decubitus ulcer.  I did not undress this as it was freshly dressing, he was just seen by the Wound Care yesterday. SKIN:  No rashes or ulceration. NEUROLOGIC:  Alert, awake, and oriented x4, coherent and fluent language, and paraplegic from the neck down.  Able to move his hands with  minimal difficulties.  LABORATORY DATA ON DAY OF DISCHARGE:  Sodium 136, potassium 4.3, chloride 102, bicarb of 25, glucose of 119, BUN of 25, creatinine 0.5, and a calcium of 9.5.  His white count was 16.7 with an ANC of 11.8, hemoglobin of 11.0 with an MCV of 81, RDW 15, platelet count 276.  Chest x-ray showed mild streaky basilar opacity favoring atelectasis, chronic left rib fraction.  ASSESSMENT AND PLAN: 1. Hypoxia and cough, most likely secondary to healthcare-associated     pneumonia.  He does have a history of Enterococcus sepsis and     Klebsiella urinary tract infection.  At this time, we will check a     UA, but this is most likely secondary to healthcare-associated     pneumonia.  We will start him on Vanc and zosyn.  We will get sputum     cultures.  We will start him on Atrovent and albuterol.  He does     not have a history of asthma.  He is wheezing minimally, so we will     start him albuterol and Atrovent. 2. Decubitus ulcer.  He does have a record of this being infected in     the past, has had Staphylococcus bacteremia, Enterococcus     bacteremia, and Klebsiella urinary tract infection.  At this time,     we will try to get records from the [facility].  We will     start him on vancomycin and Zosyn.  This is a probable source of     infection, but I did not examine the wound.  However, the patient     does relate he was not infective.  We will also try to get cultures     from the Wound Care, to trend down antibiotics if it comes back. 3. Iron deficiency anemia.  We will continue his ferrous sulfate, his     RDW is higher than his borderline.  We will check a B12 and a     folate. 4. Seizure disorders.  No changes were made.  Continue his current     meds.     Marinda Elk, M.D.     AF/MEDQ  D:  09/28/2010  T:  09/28/2010  Job:  [id number]  cc:   Candyce Churn. Allyne Gee, M.D.  Electronically Signed by Marinda Elk M.D. on 09/28/2010 01:15:16  PM

## 2010-09-28 NOTE — Discharge Summary (Signed)
[name] [name], [name] [name]                 ACCOUNT NO.:  [id number]   MEDICAL RECORD NO.:  [id number]          PATIENT TYPE:  INP   LOCATION:  1424                         FACILITY:  [facility]   PHYSICIAN:  Herbie Saxon, MDDATE OF BIRTH:  06/07/1965   DATE OF ADMISSION:  06/20/2008  DATE OF DISCHARGE:  06/26/2008                               DISCHARGE SUMMARY   DISCHARGE DIAGNOSES:  1. Urinary tract infection.  Klebsiella pneumoniae possibly on urine      culture.  2. Stage 4 decubitus ulcers, sacrum, healed, right hip.  3. Seizure disorder, stable.  4. Obstructive sleep apnea, stable.  5. Hypertension, controlled.  6. The patient is a cervical five quadriplegic.  7. Hypokalemia, replenished.  8. Anemia of chronic disease.  9. Leukocytosis, resolved.   RADIOLOGY:  The chest x-ray of 06/20/2008 was negative for pneumonia.  He had a PICC placement for IV access on 06/23/2008.  No complications.   HOSPITAL COURSE:  This 45 year old African American male presented to  the emergency room to change his suprapubic catheter, which he does  every month; however, at the catheter change, it was noted that he had  cloudy turbid blood-stained urine.  The patient also felt lightheaded  and dizzy with shortness of breath.  He reported to the emergency  roomwhen he  noticed the urine color change.  The patient also was  feeling weak and dizzy and developed shortness of breath.  He reported  to the emergency room at [facility], and urinalysis was indicative of a  urine infection.  Cultures were sent, and the urine culture came back  positive for Klebsiella pneumoniae.  The patient had been started on IV  Rocephin initially, but this has been switched over to ciprofloxacin,  which he is to have a 2-week course of.  He has remained afebrile in the  last 24 to 48 hours.  His wound care has been addressed by the wound  care nurse in the hospital.  He has also benefitted from being on an air  bed. aquagel  was being used to absorb drainage and provide antimicrobial  benefits, and it is recommended he continue this at home.  Hypokalemia  has been replenished.  Leukocytosis has improved on the antibiotic  treatment.   DISCHARGE CONDITION:  Stable.   DIET:  Low sodium, heart healthy, low cholesterol.   ACTIVITY:  Increase slowly with the aid of physical therapy at home.   Follow up with primary care physician, Dr. Dorothyann Peng, in the next 5  to 7 days.  She will repeat a urinalysis at followup visit.   MEDICATIONS ON DISCHARGE:  1. Cipro 500 mg b.i.d.  2. Zinc sulfate 220 mg daily.  3. Vitamin C 500 mg daily.  4. Percocet 5/325 one to two tablets q.6 h as needed.  5. Beneprotein 1 scoop t.i.d.  6. HCTZ 25 mg daily.  7. Hematinic Plus 1 daily.   Continue with his home medications of:  1. Baclofen 20 mg q.i.d.  2. Reglan 10 mg t.i.d.  3. Norvasc 10 mg daily.  4. Clonidine 0.2 mg  b.i.d.  5. Phenytoin 300 mg a.m.  6. Phenytoin 200 mg h.s.  7. Iron sulfate 325 mg b.i.d.  8. Pepcid 1 tablet daily.  9. Multivitamin 1 tablet daily.  10.Colace 1 tablet b.i.d.  11.Albuterol 1 to 2 puffs q.6 h p.r.n.   PHYSICAL EXAMINATION:  GENERAL:  On examination today, he is a young man  who appears not in acute respiratory distress.  VITAL SIGNS:  Temperature is 98, pulse 96, respiratory rate 20, blood  pressure 150/90.  HEENT:  Pupils equal and reactive to light and accommodation.  He is  pale.  Not jaundiced.  NECK:  Supple.  He has quadriplegia.  CHEST:  Clinically clear.  ABDOMEN:  Benign.  He has a suprapubic catheter and a colostomy bag  which is functional.  EXTREMITIES:  Atrophic limbs and spasticity occasionally.  No pedal  edema.   LABORATORY DATA:  WBC 10, hematocrit 28, platelet count 264.  Chemistry:  Sodium is 139, potassium 4.0, chloride 108, bicarbonate 27, BUN 15,  creatinine 0.3, glucose 98.   Discharge time greater than 30 minutes.      Herbie Saxon, MD   Electronically Signed     MIO/MEDQ  D:  06/26/2008  T:  06/26/2008  Job:  [id number]
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
## 2010-09-28 NOTE — Discharge Summary (Signed)
[name] [name], [name] NO.:  [id number]   MEDICAL RECORD NO.:  [id number]          PATIENT TYPE:  INP   LOCATION:  5710                         FACILITY:  [facility]   PHYSICIAN:  Altha Harm, MDDATE OF BIRTH:  1965/07/10   DATE OF ADMISSION:  09/26/2006  DATE OF DISCHARGE:  10/01/2006                               DISCHARGE SUMMARY   DISPOSITION:  Home.   DISCHARGE DIAGNOSES:  1. Methicillin-resistant Staphylococcus aureus urinary tract      infection.  2. Hypoxemia with tachycardia.  3. History of seizure disorder.  4. History of quadriplegia with spasticity.  5. History of hypertension.  6. Chronic sacral decubitus ulcer.  7. Colostomy.  8. Chronic indwelling catheter.  9. Increased stool volume.   DISCHARGE MEDICATIONS:  1. Doxycycline 100 mg p.o. b.i.d. x5 days.  2. Reglan 10 mg p.o. t.i.d.  3. Baclofen 20 mg p.o. t.i.d.  4. Norvasc 10 mg p.o. daily.  5. Clonidine 0.2 mg p.o. b.i.d.  6. Pepcid 20 mg p.o. daily.  7. Phenytex 300 mg in the morning and 200 mg at night by mouth.  8. Multivitamin one tablet p.o. daily.  9. Iron 325 mg p.o. b.i.d.  10.Colace 100 mg p.o. b.i.d.   PRIMARY CARE PHYSICIAN:  [name] [name], M.D.   CODE STATUS:  Full code.   ALLERGIES:  Questionable allergy to PENICILLIN.   PRESENTING SYMPTOMS:  Shortness of breath.   HISTORY OF PRESENT ILLNESS:  Please see H&P dictated by [name] [name],  M.D. for details of the HPI.   HOSPITAL COURSE:  Problem 1.  MRSA urinary tract infection.  The patient  came in with symptoms of fevers and some hypoxia and the initial focus  was in the lung area.  However, urinalysis revealed UTI and cultures  further revealed that the patient had methicillin-resistant  Staphylococcus aureus.  The pathogen was sensitive to Vancomycin and to  Tetracycline.  The patient was treated with Vancomycin for a total of 3  days and will continue an additional 4 days of Doxycycline for complete  treatment of the urinary tract infection.   Problem 2.  Hypoxemia with tachycardia.  The patient was initially  thought to have an early pneumonia, however, the patient's clinical  course and diagnostic studies did not bear out this diagnosis.  The  patient had initially been placed on Rocephin and Azithromycin both of  which were discontinued.  The patient has continued to have some degree  of hypoxemia and some tachycardia.  This conflict of symptoms might be  suggestive of possible PE for which the patient is at some minimal risk  due to his mobility.  However, concerning fact is that the patient is a  quadriplegic and has poor inspiratory effort and poor cough with some  atelectasis occurring on a chronic basis, thus lending itself to  hypoxemia.  In addition, the patient has a body habitus suggestive of  obstructive sleep apnea.  The patient has never been formerly evaluated  for this and the study was unable to occur while the patient was  hospitalized.  Thus in light of this symptom complex, I am going to  further evaluate the patient's hypoxemia.  If he remains hypoxic on room  air, I will get a CT pulmonary angiogram to rule out a pulmonary  embolism.  In addition, the patient will be recommended to go for a  polysomnogram as an outpatient with results to be sent to Dr. Barbee [name].   Problem 3.  Increased stool volume with popping off of his colostomy  bag.  The ostomy was evaluated by the wound ostomy nurse who felt that  the stool volume was enlarged and this was causing the bag to pop off.  This was remedied by adding an increased dose of stool softener to the  patient's regimen of medications.  Thus, the patient is now on Colace  100 mg p.o. b.i.d.   Problem 4.  Chronic sacral decubitus.  Again, this was evaluated by the  wound ostomy nurse and the patient has been getting AquaFil AG packings  daily and covered with an ABD pad.  The patient has also been on a Ken-  Air bed  during this hospitalization for pressure relief.  The patient  should continue with AquaFil dressings to the wounds with an ABD pad and  follow up with home care nursing to further treat and assess the wounds.   Problem 5.  Seizure disorder.  The patient is continuing his usual dose  of Phenytex and there is no seizure disorder observed during this  hospitalization.   Problem 6.  Quadriplegia with spasticity.  The patient continued on his  Baclofen and continued with passive range of motion to his extremities  while hospitalized.   Problem 7.  History of hypertension.  The patient is continued on his  usual quantity of Norvasc and remained normotensive during his  hospitalization.  The patient is otherwise stable and is being  discharged in stable condition.  The patient should return to Dr.  Barbee [name] in approximately 1 week and she is to follow up for outpatient  polysomnogram.   DIET:  The patient should be on a low sodium diet.   ACTIVITY:  Up as tolerated.   DRESSING CHANGES:  AquaFil AG daily dressing changes with AquaFil  covered by an ABD pad.   FOLLOWUP:  The patient should have a polysomnogram performed as an  outpatient for evaluation of obstructive sleep apnea and results sent to  Dr. Barbee [name].  The patient is also to continue his Doxycycline for a  total of 5 days.      Altha Harm, MD  Electronically Signed     MAM/MEDQ  D:  10/01/2006  T:  10/01/2006  Job:  [id number]   cc:   [name] [name], M.D.
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
## 2010-09-28 NOTE — H&P (Signed)
NAME:  [name] [name], [name] [name] NO.:  [id number]   MEDICAL RECORD NO.:  [id number]          PATIENT TYPE:  EMS   LOCATION:  MAJO                         FACILITY:  [facility]   PHYSICIAN:  Madaline Savage, MD        DATE OF BIRTH:  1965-09-03   DATE OF ADMISSION:  09/26/2006  DATE OF DISCHARGE:                              HISTORY & PHYSICAL   PRIMARY CARE PHYSICIAN:  Dr. Barbee Shropshire   CHIEF COMPLAINT:  Shortness of breath.   HISTORY OF PRESENT ILLNESS:  Mr. 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 He was apparently doing fine until 2 days ago when  he started developing chest congestion. This morning he woke up at 2:00  in the morning and he had a fever. He took his temperature. It was 99.4  and he then took some Tylenol. He was also having shortness of breath  with it. He denied any chest pain. He also was complaining of cough for  the last couple of days. He says he has sputum production, but he does  not know what color his sputum is. He denies any dysuria, abdominal  pain, nausea, or vomiting at this point of time.   PAST MEDICAL HISTORY:  1. Quadriplegia for the last 20 years.  2. Urinary tract infection with hospital admission in March of 2008.  3. Hypertension.  4. Seizure disorder.  5. Chronic sacral decubitus ulcer.   ALLERGIES:  QUESTIONABLE ALLERGY TO PENICILLIN.   CURRENT MEDICATIONS:  1. He is on Reglan 10 mg three times daily.  2. Baclofen 20 mg three times daily.  3. Norvasc 10 mg daily.  4. Clonidine 0.2 mg twice daily.  5. Pepcid 20 mg daily.  6. Phenytek 300 mg in the morning, 200 mg at night.  7. Multivitamin tablet daily.  8. Iron 325 mg twice daily.   SOCIAL HISTORY:  He lives at home. He denies any history of smoking,  alcohol, or drug abuse.   FAMILY HISTORY:  His mother died of breast  cancer at the age of 29.   REVIEW OF SYSTEMS:  GENERAL:  He denies any recent weight loss or weight  gain. He did complain of fever, no chills.  HEENT:  No headache, no  blurred vision, no sore throat.  CARDIOVASCULAR SYSTEM:  Denies chest  pain, palpitations. RESPIRATORY SYSTEM:  Does complain of shortness of  breath and cough.  GI:  No abdominal pain. No nausea, vomiting,  diarrhea, or constipation.   PHYSICAL EXAMINATION:  GENERAL:  Is alert and oriented x3.  VITALS:  Temperature is 99.7. Pulse rate of 102/min. Blood pressure:  113/72. Oxygen saturation 90% on room air.  HEENT:  Head atraumatic, normocephalic. Pupils bilaterally equal and  reactive to light. Mucous membranes are moist.  NECK:  Supple, no JVD, no carotid bruit.  CARDIOVASCULAR SYSTEM:  S1, S1 heard.  Regular rhythm.  CHEST:  Bilateral crackles  heard at the bases.  ABDOMEN:  Soft, bowel sounds heard.  CNS:  He has grade zero power in his lower extremities. Grade 2/5 in his  upper extremities.   LABS:  He had an x-ray of the chest which showed bilateral air space  disease. His white count is 11.4, hemoglobin 11, hematocrit 34,  platelets 291, sodium 136, potassium 3.9, chloride 105, bicarb 26, BUN  19, creatinine 0.6, glucose 108.   IMPRESSION:  1. Pneumonia.  2. Hypoxemia.  3. Quadriplegia.  4. Hypertension.  5. Seizure disorder.  6. Sacral decubitus ulcer.   PLAN:  1. Pneumonia.  This is a 45 year old gentleman who comes in with      bibasilar pneumonia on his chest x-ray. He is also complaining of      shortness of breath and chest congestion. He was apparently hypoxic      in the ER with his O2 sats going down to 80s on room air. We will      admit him for IV antibiotics and for oxygen therapy. We will put      him on aerosol treatments. Once his oxygenation improves, we could      discharge him home to complete his course of antibiotics at home. I      will obtain cultures while he is in the hospital.   2. Hypertension.  His blood pressure is controlled at this time. We      will continue him on his home medications.  3. Seizure disorder.  We will continue him on Phenytek.  4. Quadriplegia.  I will continue him on his muscle relaxants.  5. I will put him on DVT and GI prophylaxis.      Madaline Savage, MD  Electronically Signed     PKN/MEDQ  D:  09/26/2006  T:  09/26/2006  Job:  [id number]

## 2010-09-28 NOTE — Discharge Summary (Signed)
NAMETAREZ, BOWNS NO.:  [id number]   MEDICAL RECORD NO.:  [id number]          PATIENT TYPE:  INP   LOCATION:  5710                         FACILITY:  [facility]   PHYSICIAN:  Altha Harm, MDDATE OF BIRTH:  Dec 01, 1965   DATE OF ADMISSION:  09/26/2006  DATE OF DISCHARGE:                         DISCHARGE SUMMARY - REFERRING   ADDENDUM.   HOSPITAL COURSE:  The patient's hypoxemia was further investigated with  CT pulmonary angiogram for his hypoxia.  The patient was found to have  no evidence of pulmonary embolus.  However, the CT scan did show areas  suggestive of pneumonia.  Please note that the patient has received a  total of four days of azithromycin and Rocephin, three days of  vancomycin and will receive an additional five days of doxycycline.  This should certainly fully treat any pneumonia the patient has.  Please  note the continued antibiotics prescribed secondary to the MRSA UTI.      Altha Harm, MD  Electronically Signed     MAM/MEDQ  D:  10/02/2006  T:  10/02/2006  Job:  [id number]

## 2010-09-28 NOTE — H&P (Signed)
NAMEJANSEL, VONSTEIN                 ACCOUNT NO.:  [id number]   MEDICAL RECORD NO.:  [id number]          PATIENT TYPE:  INP   LOCATION:  1424                         FACILITY:  [facility]   PHYSICIAN:  Raphael Gibney, MD        DATE OF BIRTH:  1965/07/23   DATE OF ADMISSION:  06/20/2008  DATE OF DISCHARGE:                              HISTORY & PHYSICAL   CHIEF COMPLAINT:  Chronic indwelling catheter, here with a UTI.   HISTORY OF PRESENT ILLNESS:  Mr. Shakoor is a 45 year old African American  male who is a known C5 quadriplegic who states that his nurse visited  him this morning to change out his suprapubic catheter which is usually  changed every month.  While she changed the catheter, she noted that the  output was turbid and had a lot of sediment.  On removal of the catheter  almost 60 mL of sedimented blood-stained urine was expressed.  After  this episode, Mr. Salceda also felt lightheaded and dizzy.  He also felt a  little bit of shortness of breath.  He then came to the emergency  department of the [facility].  He currently lives with his  friend in an apartment complex.  His health care power of attorney is  Gar Ponto.  Contact number is [phone], [phone].  He had denies  any fevers, chills, cough or abdominal pain, chest pain, weakness on one  side of the face or any other symptoms.   PAST MEDICAL HISTORY:  1. Hypertension.  2. Sleep apnea.  3. History of seizures.  4. Bedsores  5. C5 quadriplegia.   PAST SURGICAL HISTORY:  Surgeries for bedsores, colostomy, suprapubic  catheter placement.   ALLERGIES:  NKDA.   SOCIAL HISTORY:  Denies any significant history of tobacco, alcohol or  illicit drug use.   FAMILY HISTORY:  Gives a significant history of breast cancer in the  month, grandmother, diabetes in the aunt.   REVIEW OF SYSTEMS:  A detailed review of system was performed and found  to be negative except as those mentioned in the HPI.   PHYSICAL  EXAMINATION:  VITAL SIGNS:  Temperature 98.7, blood pressure  102/60, respiration 16, blood pressure pulse 102.  GENERAL:  Mr. Mader is a 45 year old African American male who appears  his stated age.  He is resting quite well.  HEENT:  Pupils equal and reactive to light.  Extraocular motions intact.  Oropharynx was dry.  NECK:  Supple.  CHEST:  Clear anteriorly.  CVS:  S1, S2, tachycardiac.  No murmurs, rubs, or gallops.  ABDOMEN:  Soft.  Colostomy present.  Colostomy bag present with dark  stools.  Suprapubic catheter present.  EXTREMITIES:  No cyanosis or clubbing.  Mild edema present.  NEURO:  No sensation until the mid stomach level.  No obvious new focal  changes.   LAB/X-RAY:  Chest x-ray with no acute cardiopulmonary illnesses.  WBC  count 16.5, hemoglobin 12.6 platelets 334,000.  Troponin 0.05.  Sodium  139, potassium 4.0, creatinine 0.8, glucose 115.  UA turbid, blood, WBC,  bacteria  present.   ASSESSMENT:  Mr. Banas is a 45 year old African American male who  presents here with urinary tract infection in the setting of a chronic  indwelling suprapubic catheter present.   PLAN:  Will admit to the inpatient service.  Will continue IV Rocephin 1  gram q.24 hours.  IV fluids normal saline.  We will continue his home  medications of baclofen and Reglan.  Will continue his home  antihypertensive medications of Norvasc, clonidine, and will also  continue his antiseizure medications of phenytoin.  Will follow urine  cultures, change antibiotic appropriately. DVT prophylaxis with Lovenox.  GI prophylaxis with PPI.  Code status is Full.      Raphael Gibney, MD  Electronically Signed     HV/MEDQ  D:  06/20/2008  T:  06/21/2008  Job:  [id number]
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
## 2010-09-28 NOTE — Discharge Summary (Signed)
[name], [name] NO.:  [id number]   MEDICAL RECORD NO.:  [id number]          PATIENT TYPE:  INP   LOCATION:  6704                         FACILITY:  [facility]   PHYSICIAN:  [name] [name], M.D. DATE OF BIRTH:  04/01/1966   DATE OF ADMISSION:  10/22/2007  DATE OF DISCHARGE:  11/02/2007                               DISCHARGE SUMMARY   This is a final discharge summary that will outline the events that  occurred during the time frame of June 17 up until today, June 19,  only.  For events that occurred prior to June 17, please see the  discharge summary that was dictated by Dr. Michelene Gardener.  With  regards to final diagnoses, they include the following:  1. Pneumonia.  2. Anemia with heme-positive stools.  3. Iron deficiency.  4. Ileus/small bowel obstruction.  5. Pressure ulcer with cultures positive for Staph aureus.  6. Renal insufficiency.  7. Hyperkalemia.  8. Esophagitis.  9. Gastritis.  10.Gastric ulcer.   CONSULTATIONS:  Dr. Anselmo Rod.   PROCEDURES:  EGD completed on October 30, 2007.   With regards to:  1. Pneumonia.  Over the past 3 days, the patient's respiratory status      has appeared to be stable.  He has not complained of any shortness      of breath and has demonstrated no obvious active respiratory      distress.  It appears that his pneumonia is in a state of      resolution.  He has completed at least 12 days of IV Zosyn in      addition to at least 1 day of azithromycin, Rocephin and      vancomycin.  2. Anemia with heme-positive stools.  On October 30, 2007, Dr. Jeani Hawking performed an EGD on [name] [name].  His final impression was that      the patient has mild esophagitis, gastritis as well as gastric      ulcers.  Protonix has been used as treatment.  The patient has had      a slight drift in his hemoglobin.  On June 19 it was noted to be      8.5.  For this, he will receive at least 1 unit of packed red blood  cells.  He is otherwise asymptomatic and hemodynamically stable  3. Renal insufficiency.  The patient's BUN and creatinine have been      relatively stable over the latter portion of his hospitalization.      For his hyperkalemia, the patient's potassium level was noted to be      5.4 on  November 01, 2007.  This may be secondary to combination of      p.o. potassium supplementation as well as that provided in his IV      fluid,  both of which were discontinued.  Kayexalate was provided,      and as of today, June 19, the patient's hyperkalemia has resolved.  4. Decubitus wound.  The wound care nurse has been providing  therapy.      On June 17, she indicated that all of the patient's wounds appeared      to be 100% red with moderate drainage.  Aquagel was recommended to      absorb drainage and promote healing.  5. Iron deficiency.  Ferrous sulfate has been initiated during the      course of this hospitalization.   CONDITION AT THE TIME OF DISCHARGE:  Again, the patient has not  complained of any respiratory-related symptoms over the last 3 days.  Currently he looks comfortable.  His vitals:  His temperature is 98.4, heart rate 95, respirations 20,  blood pressure 138/85, O2 sat 98% on room air.  The decision has been  made to discharge the patient from the hospital back to his home.   His medications will consist of:  1. Albuterol MDI 2 puffs q.6 h.  2. Norvasc 10 mg p.o. daily.  3. Baclofen 20 mg p.o. b.i.d.  4. Colace 100 mg p.o. b.i.d.  5. Ferrous sulfate 325 mg p.o. b.i.d.  6. Lasix 20 mg p.o. daily.  7. Mucinex 600 mg p.o. b.i.d.  8. Lantus insulin 5 units subcu q.h.s.  9. Reglan 10 mg p.o. q.6 h.  10.Multivitamin 1 tablet p.o. daily.  11.Protonix 40 mg p.o. daily.  12.Dilantin 300 mg daily.  13.Dilantin 200 mg p.o. q.h.s.   The patient's primary care doctor is Dr. Dorothyann Peng.      [name] [name], M.D.  Electronically Signed     OR/MEDQ  D:  11/02/2007  T:   11/02/2007  Job:  [id number]   cc:   Candyce Churn. Allyne Gee, M.D.

## 2010-09-29 LAB — DIFFERENTIAL
Basophils Absolute: 0 10*3/uL (ref 0.0–0.1)
Lymphocytes Relative: 13 % (ref 12–46)
Lymphs Abs: 2 10*3/uL (ref 0.7–4.0)
Monocytes Absolute: 1.8 10*3/uL — ABNORMAL HIGH (ref 0.1–1.0)
Neutro Abs: 11.4 10*3/uL — ABNORMAL HIGH (ref 1.7–7.7)

## 2010-09-29 LAB — COMPREHENSIVE METABOLIC PANEL
ALT: 18 U/L (ref 0–53)
Alkaline Phosphatase: 111 U/L (ref 39–117)
CO2: 25 mEq/L (ref 19–32)
Glucose, Bld: 127 mg/dL — ABNORMAL HIGH (ref 70–99)
Potassium: 3.9 mEq/L (ref 3.5–5.1)
Sodium: 137 mEq/L (ref 135–145)
Total Bilirubin: 0.2 mg/dL — ABNORMAL LOW (ref 0.3–1.2)

## 2010-09-29 LAB — URINALYSIS, ROUTINE W REFLEX MICROSCOPIC
Ketones, ur: NEGATIVE mg/dL
Nitrite: POSITIVE — AB
Protein, ur: 30 mg/dL — AB
Urobilinogen, UA: 0.2 mg/dL (ref 0.0–1.0)

## 2010-09-29 LAB — CBC
HCT: 29.5 % — ABNORMAL LOW (ref 39.0–52.0)
Hemoglobin: 9.2 g/dL — ABNORMAL LOW (ref 13.0–17.0)
MCV: 81.7 fL (ref 78.0–100.0)
WBC: 15.4 10*3/uL — ABNORMAL HIGH (ref 4.0–10.5)

## 2010-09-29 LAB — MRSA PCR SCREENING: MRSA by PCR: NEGATIVE

## 2010-09-29 LAB — URINE MICROSCOPIC-ADD ON

## 2010-09-30 ENCOUNTER — Inpatient Hospital Stay (HOSPITAL_COMMUNITY): Payer: Medicare Other

## 2010-09-30 LAB — FOLATE RBC: RBC Folate: 1230 ng/mL — ABNORMAL HIGH (ref >=366)

## 2010-10-01 NOTE — Consult Note (Signed)
NAME:  KEVAL, NAM NO.:  [id number]   MEDICAL RECORD NO.:  [id number]           PATIENT TYPE:   LOCATION:                                 FACILITY:   PHYSICIAN:  Consuello Bossier., M.D. DATE OF BIRTH:   DATE OF CONSULTATION:  06/02/2005  DATE OF DISCHARGE:                                   CONSULTATION   PRESENT ILLNESS:  I was asked to see this 45 year old male by Dr. Delford Field.  The patient has a complicated medical history.  He was admitted on May 10, 2005 and was scheduled to be discharged on May 20, 2005. He was  admitted with septic shock secondary to urinary tract infection.  His  history goes back, apparently, to about 15 years ago where he became  quadriplegic.  He also has had a history of hypertension and seizure  disorders.  He also has hypokalemia, anemia, hypoglycemia, and  hypoalbuminemia.  While admitted to the hospital he has developed acute  respiratory failure as well as acute renal failure and is currently  undergoing renal hemodialysis, as well as is scheduled to have a  tracheostomy because of the need for prolonged respiratory assistance.  We  were asked to see the patient because of the status of the decubiti ulcers.  He is a patient of the plastic surgery service at the [facility] and has been scheduled to go down  there for some additional surgery which might include a colostomy prior to  any additional surgery.   He has had multiple surgical procedures in the past, in this area, but we do  not know exactly where or when.   PHYSICAL EXAMINATION:  FOCUSED EXAM:  Examination revealed a small sacral  decubitus ulcer which is chronic with granulation tissue coursing down to  what appears to be the sacrum.  There is similar chronic bilateral ischial  pressure sores with exposed granulation tissue, again, down to what appeared  to be ischial bones.  There is a rectal tube in  place.  There are previous  flap scars from surgical procedures done.  There is nothing that appears to  be tracking anywhere that would currently need debridement.   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   In light of the patient's overall medical status including his acute renal  and respiratory failure, I suggest continuing his current management with  the placement of Mepilex over his pressure sores and his rectal incontinence  too. The Mepilex could be changed as often as needed.  If he gets over these  pulmonary and renal insults, I suggest that he keep his plans to go to [facility] for the continual surgical management of these chronic decubiti.      Consuello Bossier., M.D.  Electronically Signed     HH/MEDQ  D:  06/02/2005  T:  06/02/2005  Job:  [id number]

## 2010-10-01 NOTE — Discharge Summary (Signed)
[name] [name], [name] [name] NO.:  [id number]   MEDICAL RECORD NO.:  [id number]          PATIENT TYPE:  INP   LOCATION:  3303                         FACILITY:  [facility]   PHYSICIAN:  [name] [name], M.D. DATE OF BIRTH:  August 12, 1965   DATE OF ADMISSION:  05/10/2005  DATE OF DISCHARGE:                                 DISCHARGE SUMMARY   PRIMARY CARE PHYSICIAN:  [name] [name], M.D.   FINAL DIAGNOSES:  1.  Sepsis/urosepsis involving a combination of Escherichia coli being      present in both the urine and involving his sacral decubitus ulcer.  2.  Shock secondary to both sepsis and profound dehydration.  3.  Apnea.  4.  Diarrhea.  5.  Altered mental status.  6.  Multiple sacral decubitus ulcers, infected by Escherichia coli.  7.  Hypoglycemia.  8.  Hypokalemia.  9.  Anemia.  10. Hypoalbuminemia.  11. Apnea.   PROCEDURES:  1.  Transfusion of two units of packed red blood cells on May 13, 2005.  2.  Placement of a PICC line on May 10, 2005.  3.  Portable chest x-ray May 11, 2005.  4.  Repeat portable chest x-ray May 12, 2005.   HISTORY OF PRESENT ILLNESS:  [name] [name] is a 45 year old gentleman with a  past medical history of quadriplegia who was brought into the hospital  unresponsive. In addition to having a history of quadriplegia, he also has a  history of hypertension, seizure disorder. He was accompanied by his friend,  [name] [name], at the time of his admission. She provided the  history. She stated that she last saw the patient on May 09, 2005 at  which time she noticed that his urine bag appeared to be filled with a thick-  appearing urine and had a foul odor to it. The patient did not complain of  any symptoms at that particular time; however, the following day, which was  the day of admission, another friend went over to his home to give him a  bath. His friend found the patient to be covered in his own stool. His  urine  bag appeared to be filled with blood and the patient was unresponsive and  covered in sweat. Multiple attempts were made to arouse the patient and he  did eventually become aroused. However, he would go in and out of  consciousness. EMS was called and they aroused the patient, however, he  appeared to be very confused. Therefore, he was brought to the emergency  room for further evaluation.   For past medical history, please see the dictation completed by Dr. Michaelyn [name] on May 10, 2005.   HOSPITAL COURSE:  Problem 1:  SEPSIS:  Following the patient's presentation  to the hospital, he was started on empiric IV antibiotics initially  consisting of IV vancomycin and ciprofloxacin was started, because at that  time his friend stated that there was a possible allergy to penicillin which  was later discovered to be false. Blood cultures, urine cultures, and  cultures of his sacral decubitus ulcers were  drawn. A urinalysis confirmed  the presence of a urinary tract infection. On May 10, 2005, the urine  culture was noted to have consisted of E. coli. Likewise, wound culture  reported on May 10, 2005 consistent of E. coli both of which were  sensitive to ceftriaxone. Therefore, both the patient's vancomycin and  ciprofloxacin were discontinued and the ceftriaxone was started. Again,  sensitivities were reported on May 13, 2005 and it was on May 13, 2005 ceftriaxone was started and the vancomycin and ciprofloxacin were both  discontinued. Blood cultures have been negative thus far.   The patient appears to have responded well with regards to his current  regimen of treatment. By the following day, he became more arousable and his  vitals began to improve.   Problem 2:  SHOCK:  When the patient first arrived into the hospital, his  blood pressure was noted to have been 65/49. Dopamine was started at that  particular time and aggressive IV fluid  resuscitation was also implemented.  It appeared that a combination of sepsis and profound dehydration may have  contributed to the shock. By the following day,  the dosage of dopamine was  decreased and since that time the patient has been weaned off dopamine.   Problem 3:  ANEMIA:  By May 13, 2005, the patient's hemoglobin was  noted to have declined from 12.1 at the time of admission to 7.8. Stools  were found to be negative for occult blood. The patient has been typed and  crossed for two units of blood and he has been transfused as of 29th. The  source of the patient's drop in hemoglobin is questionable. Further studies  may have to be undertaken.   Problem 4:  DIARRHEA:  Stool studies have been sent regarding the patient's  diarrhea. They have been found to be negative for culturing. They are also  negative for EHEC. In addition, they have also been negative for C.  difficile. The nurses have reported that his stooling has decreased. He has  had a rectal pouch placed since his time of admission and as of May 14, 2005 I have started Imodium. The dosage may be titrated and there may be  some consideration to a GI consultation if the stooling does not decline  further.   Problem 5:  ALTERED MENTAL STATUS:  This has improved significantly. When  the patient first arrived to the emergency room he was nonconversant. Since  that time, the patient has regained his level of consciousness and he has  been alert and oriented throughout the remaining portion of his  hospitalization.  Currently the patient appears to be back to baseline.   Problem 6:  INFECTED SACRAL DECUBITUS WOUND ULCERS:  Wound care nurse has  been involved with caring for the patient's decubitus ulcer. Likewise, E.  coli grew from the decubitus ulcer and the patient has been placed on the  appropriate antibiotics.   Problem 7:  HISTORY OF SEIZURE DISORDER:  The patient has not had any seizures over the course  of his hospitalization. He was placed on his home  regimen of Dilantin and his levels have been checked.   Problem 8:  HYPOALBUMINEMIA:  The patient's albumin was noted to be low.  Nutrition has been consulted regarding proper dietary recommendations for  the patient. This will have to be monitored. A prealbumin has also been  checked.   Problem 9:  APNEA:  When the patient first came into the  hospital, his  respiratory system appeared to be compromised on the date of his admission.  He appeared to have brief episodes of apnea; therefore, BiPAP was started at  the time of admission. By the following date, the nurses noted that when the  patient was awake his oxygenation appeared to be fine; however, when he fell  asleep, his O2 saturation declined precipitously. Therefore, BiPAP has been  used through the course of the patient's hospitalization in particular at  night time.   Problem 10:  HYPOKALEMIA:  This patient was noted by May 13, 2005 to  have a potassium of 2.9. For this, he has received supplementation IV and  likewise he has been started on a daily regimen of K-Dur p.o. This will also  need to be monitored closely.   This brings me up to May 14, 2005. Issues needing to be addressed at  this particular time are 1) The patient currently still utilizes BiPAP at  night time in particular. Whether or not the patient will need this when he  goes home is questionable. This will have to be addressed.  2) Diarrhea:  Hopefully, there will be some further resolution of the patient's diarrhea  prior to being discharged from the hospital. 3) The patient has a large  sacral decubitus ulcer and there had already been plans prior to him being  hospitalized at this particular time for him to follow up at [facility] to have  that decubitus repaired and also for him to undergo a colostomy placement.  Therefore, attempts should be made to discharge the patient relatively soon.  It appears the  bulk of his issues have been addressed and I would imagine  the patient should be ready for discharge very soon.      [name] [name], M.D.  Electronically Signed     OR/MEDQ  D:  05/14/2005  T:  05/14/2005  Job:  [id number]   cc:   [name] [name], M.D.  Fax: [phone]
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
## 2010-10-01 NOTE — Discharge Summary (Signed)
[name] [name], [name] [name] NO.:  [id number]   MEDICAL RECORD NO.:  [id number]                   PATIENT TYPE:  INP   LOCATION:  5021                                 FACILITY:  [facility]   PHYSICIAN:  Olene Craven, M.D.            DATE OF BIRTH:  March 02, 1966   DATE OF ADMISSION:  06/25/2003  DATE OF DISCHARGE:  06/30/2003                                 DISCHARGE SUMMARY   DISCHARGE DIAGNOSES:  1. Pneumonia.  2. Paralytic ileus.  3. Quadriplegia.  4. Decubitus ulcer.  5. Hypertension.  6. Urostomy.  7. Status post anemia of chronic disease.  8. Neurogenic bowel.   CONSULTATIONS:  None.   PROCEDURES:  None.   DISCHARGE MEDICATIONS:  1. Prilosec over the counter.  2. Cytotec 200 mg q.h.s.  3. Colace 100 mg p.o. q.d.  4. Augmentin 875/125 one b.i.d.  5. Vitamin C 500 mg b.i.d.  6. Norvasc 10 mg p.o. q. d.  7. Zinc sulfate 220 mg one b.i.d.  8. Baclofen 20 mg t.i.d.  9. Reglan 10 mg t.i.d.  10.      Clonidine 0.2 mg b.i.d.   The patient is to follow up with his plastic surgeon as previously scheduled  and with Dr. Barbee Shropshire in one to two weeks.   HOSPITAL COURSE:  The patient was admitted to [facility] after a  several day history of nausea, vomiting and fever.  He has been followed by  Dr. Lollie Sails at [facility], who is a Engineer, petroleum who is working on  his decubitus ulcers.  X-ray did indicate an infiltrate on x-ray of the  ileus and KUB.  He was admitted for further evaluation.  The patient was  admitted and started on IV antibiotics and IV hydration.  His abdomen was  followed during the course of the hospitalization.  He did have regular  bowel movements with improvement of his  distension.  The patient defervesced.  He was discharged in stable condition  to follow-up with his plastic surgeon for possible flap procedure in the  near future.  He was discharged on Augmentin antibiotics as he was on  previously.  He was  discharged in stable condition, resolution of his  pneumonia and paralytic ileus.                                                Olene Craven, M.D.    DEH/MEDQ  D:  08/26/2003  T:  08/27/2003  Job:  [id number]
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
## 2010-10-01 NOTE — Discharge Summary (Signed)
NAMEJERVON, REAM NO.:  [id number]   MEDICAL RECORD NO.:  [id number]          PATIENT TYPE:  INP   LOCATION:  1617                         [facility]ACILITY[facility]  [facility]   PHYSICIAN:  Lonia Blood, M.D.DATE OF BIRTH:  Nov 18, 1965   DATE OF ADMISSION:  07/17/2006  DATE OF DISCHARGE:  07/21/2006                               DISCHARGE SUMMARY   PRIMARY CARE PHYSICIAN:  Dr. Garner Nash.   DISCHARGE DIAGNOSES:  1. Small bowel obstruction/ileus - spontaneously resolved.  2. Quadriplegia.  3. Seizure disorder.  4. Chronic anemia.  5. Hypertension.  6. History of sacral decubitus ulcers with ongoing treatment.  7. 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  8. 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  9. History of urosepsis.  10.Neurogenic bowel.   DISCHARGE MEDICATIONS:  1. Reglan 10 mg t.i.d.  2. Baclofen 20 mg t.i.d.  3. Norvasc 10 mg daily.  4. Clonidine 0.2 mg b.i.d.  5. Phenytek 300 mg daily and 200 mg q.h.s.  6. Pepcid over-the-counter daily.  7. Multivitamin daily.   WOUND CARE:  Patient's buttock wounds are to be cleaned with normal  saline on a daily basis.  The wounds should then be packed with Aquacel  AG and covered with the appropriate dressing.   FOLLOWUP:  The patient is advised to followup with Dr. Garner Nash in 7-  10 days.  At that time his wound should be inspected and it should  assured that his bowels continue to move well.   PROCEDURES:  A CT scan of the abdomen July 18, 2006, revealing small  bowel obstruction with no free fluid or free air and cardiomegaly with  bibasilar atelectasis.   CONSULTATIONS:  [facility].   HOSPITAL COURSE:  Mr. Jann Ra is a very pleasant 45 year old  quadriplegic gentleman who lives independently in [place] with help  from his family.  He presented to the hospital on July 17, 2006, with  complaints of severe abdominal  pain.  A KUB raises a question of a small  bowel obstruction versus an ileus.  CT scan of the abdomen was  ultimately obtained which did confirm this diagnosis.  An NG tube was  placed.  The patient was maintained on n.p.o. status.  IV fluids were  administered to prevent dehydration.  IV Reglan was administered.  Patient's symptoms improved significantly with NG suction.  Electrolytes  were maximized.  Followup exams revealed resolution of the patient's  ileus.  The patient's diet was advanced and he tolerated this without  difficulty.  No significant other complications were encountered during  his hospital stay.   DISCHARGE MEDICATIONS:  Are as listed above.      Lonia Blood, M.D.  Electronically Signed     JTM/MEDQ  D:  07/21/2006  T:  07/21/2006  Job:  [id number]   cc:   Olene Craven, M.D.  Fax: [phone]

## 2010-10-01 NOTE — H&P (Signed)
[name] [name], [name] [name] NO.:  [id number]   MEDICAL RECORD NO.:  [id number]          PATIENT TYPE:  INP   LOCATION:  3035                         FACILITY:  [facility]   PHYSICIAN:  Altha Harm, MDDATE OF BIRTH:  December 30, 1965   DATE OF ADMISSION:  07/25/2006  DATE OF DISCHARGE:                              HISTORY & PHYSICAL   CHIEF COMPLAINT:  Fever.   HISTORY OF PRESENT ILLNESS:  This is a 45 year old gentleman with  quadriplegia who was recently discharged from [facility] for a  bowel obstruction, who presented to the emergency room today with  complaints of fever.  The patient states that he was discharged on  Saturday and since then has been doing well.  He states that this  morning he woke up feeling warm and had his caregiver check temperature  which revealed a maximum temperature of 100.1.  Based upon this, his  nurse recommended he be brought to the emergency room.  The patient  admits to cough productive of phlegm; however, he states that he  swallows it due to his cough mechanism associated with quadriplegia.  The patient admits to scratchy throat.  He denies any rhinorrhea, any  facial pain.  The patient had an x-ray done here in the emergency room  which preliminarily showed a possible left lower lobe pneumonia, and we  are asked to see the patient for admission.   PAST MEDICAL HISTORY:  Significant for:  1. Quadriplegia x20 years.  2. Hypertension.  3. Seizure disorder, last seizure 9 years ago.  4. Sacral and scrotal decubiti.  5. Colonoscopy done in 2007.   SOCIAL HISTORY:  The patient resides with a roommate.  He has caregiver  who is hired independently through the independent living program, and  he is also followed by a nurse with [facility].  The patient denies  any tobacco, alcohol, or drug use.   CURRENT MEDICATIONS:  1. Baclofen 20 mg p.o. 3 times a day.  2. Clonidine 0.2 mg p.o. b.i.d.  3. Norvasc 10 mg p.o. daily.  4. Pepcid 1 tablet p.o. daily.  5. Reglan 10 mg p.o. 3 times a day with meals.  6. Phenytek 300 mg in the morning and 200 mg at bedtime.  7. Iron sulfate 25 mg p.o. b.i.d.  8. Multivitamins b.i.d.   PRIMARY CARE PHYSICIAN:  Olene Craven, M.D.   ALLERGIES:  No known drug allergies.   REVIEW OF SYSTEMS:  Fourteen systems are reviewed.  All systems negative  except as noted in the HPI.   LABORATORY DATA DONE IN THE EMERGENCY ROOM:  Urinalysis showed 20 wbc's,  positive for leukocyte esterase and nitrites.   So far, labs have not been drawn for the patient as he has been unable  to establish IV access.  We are awaiting CBC and BMP at this time.   Chest x-ray performed shows preliminarily that the patient may have a  left lower lobe pneumonia versus atelectasis.   PHYSICAL EXAMINATION:  GENERAL:  The patient is resting comfortably and  appears to be sweating which he  says is unusual for him.  VITAL SIGNS: Temperature on arrival was 101.2.  Temperature right now is  100.9.  Heart rate 103, respiratory rate 26, blood pressure 95/51, pulse  oximetry 96% on 4 liters O2 nasal cannula.  HEENT:  Normocephalic and atraumatic.  Pupils equal, round, and reactive  to light and accommodation.  Extraocular movements intact.  Fundi  benign.  Tympanic membranes good landmarks.  Nasal mucosa shows no  polyps.  Oral mucosa is moist without exudate, erythema, or lesions  noted.  NECK:  The patient has a large thickened neck.  There is no JVD or  carotid bruit noted.  Trachea is midline.  No masses or thyromegaly  noted.  RESPIRATORY:  The patient appears to have increased respiratory effort.  No accessory muscle use is noted, however.  The patient had decreased  breath sounds in the left lower lobe; however, there are no crackles or  wheezing noted.  ABDOMEN: Protuberant and distended.  The patient does have a colostomy  in place with formed stool in it.  EXTREMITIES:  The patient has splints  on the arms bilateral upper  extremities.  He has contractures bilateral lower extremities.  SKIN:  The patient has sacral decubitus and decubitus on the posterior  part of the scrotum.  PSYCHIATRIC:  Alert and oriented x3.  Good cognition, good recent and  remote recall.  NEUROLOGIC: The patient is known quadriplegic with no use of his lower  extremities and limited use of his upper extremities.  At this point,  the patient shows no focal neurological deficits outside of the  quadriplegia.   ASSESSMENT AND PLAN:  1. Possible pneumonia.  2. Urinary tract infection.  3. Quadriplegia.  4. Hypertension, associated with this infection.   We will get blood cultures on the patient, a urine culture on the  patient, and start the patient on Zosyn for institution-acquired urinary  tract infection and pneumonia considering the patient was recently  hospitalized and has only been 3 days out of the hospital.  The patient  will be admitted at this time, given IV fluid support, and further  decisions to be made based on the patient's laboratory studies and  course and response to therapy.  The patient has very poor IV access and  will probably need a PICC line placed during this hospitalization.      Altha Harm, MD  Electronically Signed     MAM/MEDQ  D:  07/25/2006  T:  07/25/2006  Job:  [id number]   cc:   Olene Craven, M.D.

## 2010-10-01 NOTE — H&P (Signed)
[name] [name], [name] NO.:  [name]   MEDICAL RECORD NO.:  [id number]          PATIENT TYPE:  EMS   LOCATION:  ED                           FACILITY:  [facility]   PHYSICIAN:  Michaelyn Barter, M.D. DATE OF BIRTH:  1965/06/16   DATE OF ADMISSION:  07/17/2006  DATE OF DISCHARGE:                              HISTORY & PHYSICAL   CHIEF COMPLAINT:  Abdominal pain.   HISTORY OF PRESENT ILLNESS:  [name] [name] is a 45 year old gentleman with a  past medical history of quadriplegia, who was treated at [facility] from June 19, 2006 up until June 22, 2006.  During that  time he came in complaining of abdominal pain and was subsequently  diagnosed with acute abdominal distention with a questionable small  bowel obstruction versus gastric outlet obstruction being the source of  his symptoms.  After his discharge, he states that he did okay up until  this morning at approximately 5 a.m., during which time he experienced a  sharp pain located within the upper epigastric region of his abdomen.  Since the abdominal pain started he has had a decreased appetite.  His  caregiver discovered that his colostomy bag had been broken open  secondary to an increased amount of gastric contents.  He has also had  some diarrhea.  The pain has subsided throughout the course of the day.  It did not radiate to his back, nor did it radiate to any other place.  He states that the pain that he has experienced is similar to the pain  that was experienced approximately 1 month ago, requiring  hospitalization.  He complains of some nausea but no emesis.  No fevers  or chills.  He felt okay prior to this a.m.   PAST MEDICAL HISTORY:  1. Acute abdominal distention back in February 2008.  2. Quadriplegia.  3. Seizure disorder.  4. Anemia.  5. Hypertension.  6. UTI secondary to E. coli.  7. History of sacral decubitus ulcers.  8. Nodular opacity of the right upper lung zone on chest  x-ray from a      prior chest x-ray.  9. 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  10.Pneumonia with Eikenella corrodens.  11.Acute renal failure.  12.Sepsis secondary to urinary tract infection.  13.The patient also has a history of paralytic ileus.  14.Neurogenic bowel.  15.In the past he has had anemia of chronic disease.   PAST SURGICAL HISTORY:  1. Tracheostomy done June 03, 2005, secondary to a prolonged      intubation.  2. Ultrasound-guided right internal jugular Diatek catheter was placed      on June 01, 2004, secondary to acute renal failure.  3. He has had a urostomy in the past.   ALLERGIES:  PENICILLIN HAS A QUESTIONABLE ALLERGIC REACTION.   CURRENT MEDICATIONS:  This is from the discharge summary that was  completed June 22, 2006:  1. Baclofen 20 mg p.o. t.i.d.  2. Metoclopramide 10 mg p.o. t.i.d.  3. Norvasc 10 mg p.o. every day.  4. Clonidine 0.2 mg p.o. b.i.d.  5. Phenytek 300 mg p.o. every day and 200 mg p.o. q.h.s.  6. Iron 325 mg p.o. b.i.d.  7. Pepcid one tablet p.o. every day.  8. Multivitamin one tablet every day.   SOCIAL HISTORY:  Cigarettes:  The patient denies.  Alcohol:  The patient  denies.   FAMILY HISTORY:  Mother died secondary to breast cancer at the age of  3.   REVIEW OF SYSTEMS:  As per HPI.   PHYSICAL EXAMINATION:  GENERAL:  The patient is awake.  He is  cooperative.  He does not appear to be in any obvious distress.  He is a  morbidly obese gentleman.  VITAL SIGNS:  His blood pressure is 108/52, heart rate 93, respirations  20, O2 sat 95%.  HEENT:  Atraumatic, anicteric.  Extraocular movements are intact.  Right  pupil is slightly constricted with a decreased reactivity to light.  NECK:  Supple.  No lymphadenopathy.  Thyroid is not palpable.  CARDIAC:  S1 S2 present.  Regular rate and rhythm.  RESPIRATORY:  No crackles or wheezes.  ABDOMEN:  Soft, nondistended, nontender.  Positive bowel sounds  which  are hyperactive.  Colostomy is present with a brownish/greenish stool  present.  EXTREMITIES:  Positive bilateral pitting edema with the right side being  greater than the left.  Both arms are atrophic.  NEUROLOGIC:  The patient is alert and oriented x 3.  MUSCULOSKELETAL:  Very limited.   White blood cell count is 6, hemoglobin 12.3, hematocrit 36.8, platelets  264.  Sodium 137, potassium 4.1, chloride 101, CO2 27, BUN 18,  creatinine 0.55, glucose 112.  Bilirubin total 0.6, alk phos 105, SGOT  29, SGPT 32, total protein 8.3, albumin 3.7, calcium 8.8.   Chest x-ray is consistent with cardiomegaly with bibasilar scarring and  atelectasis.  The stomach appears to be distended and the small bowel is  consistent with a small bowel obstruction.   ASSESSMENT/PLAN:  1. Acute onset of abdominal pain.  This may be related to the small      bowel obstruction that is seen on x-ray.  Again, the patient's      abdomen is currently benign and he states that his pain has      improved significantly over the course of the day.  Currently, he      denies having any abdominal pain during this physical examination.      We will consider placing the nasogastric tube.  We will also check      a CT scan of the patient's abdomen.  The patient was seen by      gastroenterology during his last visit.  May consider consulting      gastroenterology versus general surgery pending the results of      further investigation.  Likewise, the presence of stool in the      patient's colostomy also gives me question with regards to whether      or not the patient does actually have a small bowel obstruction.  2. Hypertension.  This is currently stable.  We will resume the      patient's prior home medications.  3. History of quadriplegia.  We will monitor this.  4. History of sacral decubitus ulcers. We will consult wound care. 5. History of seizure disorder.  We will resume the patient's prior      home  medications.      Michaelyn Barter, M.D.  Electronically Signed     OR/MEDQ  D:  07/17/2006  T:  07/17/2006  Job:  [id number]

## 2010-10-01 NOTE — Op Note (Signed)
NAMEBRACKEN, MOFFA NO.:  [id number]   MEDICAL RECORD NO.:  [id number]          PATIENT TYPE:  INP   LOCATION:  2916                         FACILITY:  [facility]   PHYSICIAN:  Zola Button T. Lazarus Salines, M.D. DATE OF BIRTH:  [birth year]   DATE OF PROCEDURE:  06/03/2005  DATE OF DISCHARGE:                                 OPERATIVE REPORT   PREOPERATIVE DIAGNOSIS:  Prolonged intubation.   POSTOPERATIVE DIAGNOSIS:  Prolonged intubation.   PROCEDURE PERFORMED:  Tracheostomy.   SURGEON:  Gloris Manchester. Lazarus Salines, M.D.   ANESTHESIA:  General indwelling orotracheal.   BLOOD LOSS:  Minimal.   COMPLICATIONS:  None.   FINDINGS:  A bulky/fatty lower neck.  Bulky thyroid isthmus.  Large-caliber  trachea.  A #8 Shiley tracheostomy placed and seemed to be well-situated,  but with a very large-diameter neck.   PROCEDURE:  With the patient in a comfortable supine position, general  anesthesia was administered per indwelling orotracheal tube.  At an  appropriate level, a shoulder roll was placed and the neck was extended and  the head was supported.  The lower neck was palpated with the findings as  described above.  Xylocaine 1% with 1:100,000 epinephrine, 10 mL total, was  infiltrated into the surgical field for intraoperative hemostasis.  Several  minutes were allowed for this to take effect.  A sterile preparation and  draping of the low neck was accomplished.   Halfway between the cricoid cartilage and the sternal notch, a 4-cm  transverse incision was sharply executed and carried down through skin and  abundant subcutaneous fat.  The superficial layer of the deep cervical  fascia was lysed.  Several prominent anterior jugular veins were identified  and were separated in the midline and allowed to retract laterally, but were  not ligated.  The midline raphe of the strap muscle was divided.  The  thyroid isthmus was identified.  The anterior face of the thyroid isthmus  was cleaned  off and then at the pretracheal area at the cricoid cartilage,  the thyroid isthmus was isolated between hemostats, divided and controlled  with 2-0 silk suture ligatures.  The anterior face of the trachea was  readily visualized.  A transverse incision of approximately 1.5 cm was  executed in the 2nd-to-3rd interspace and the tracheal lumen was entered.  A  1-cm inferiorly based cartilaginous flap was executed and secured to the  lower edge of the wound with a 2-0 chromic stitch.  The mucosal edges were  cauterized for hemostasis.   At this point, a previously tested #8 Shiley tracheostomy tube was placed  into the opening without difficulty.  It seemed to be long enough, although  the neck was somewhat deep.  The cuff was inflated and observed to be intact  and containing air.  Ventilation was assumed per tracheostomy tube without  difficulty.  The tracheostomy tube was secured in the standard fashion using  the cotton twill ties.  Hemostasis was observed.  At this point the  procedure was completed.  The patient was returned to Anesthesia, awakened,  and transferred  back to the coronary intensive care unit in stable  condition.   COMMENT:  [age]-year-old black male with a long history of  quadriplegia, admitted to the hospital with urosepsis and has developed  complications during this hospitalization including pneumonia and  respiratory failure, hence the indication for today's procedure.  Anticipate  a routine postoperative recovery in so far as routine tracheostomy care.  There was a question if the patient had obstructive sleep apnea and the  tracheostomy ought to a satisfactory treatment for that problem.      Gloris Manchester. Lazarus Salines, M.D.  Electronically Signed     KTW/MEDQ  D:  06/03/2005  T:  06/03/2005  Job:  [id number]

## 2010-10-01 NOTE — Op Note (Signed)
[name] [name], [name] [name]                 ACCOUNT NO.:  [id number]   MEDICAL RECORD NO.:  [id number]          PATIENT TYPE:  INP   LOCATION:  2916                         FACILITY:  [facility]   PHYSICIAN:  Balinda Quails, M.D.    DATE OF BIRTH:  1966/03/23   DATE OF PROCEDURE:  06/01/2004  DATE OF DISCHARGE:                                 OPERATIVE REPORT   SURGEON:  Denman George, MD.   ASSISTANT:  Nurse.   ANESTHETIC:  Local with MAC.   ANESTHESIOLOGIST:  Edwards.   PREOPERATIVE DIAGNOSIS:  Acute renal failure.   POSTOPERATIVE DIAGNOSIS:  Acute renal failure.   PROCEDURE:  Ultrasound-guided right internal jugular Diatek catheter.   OPERATIVE PROCEDURE:  The patient was brought to the operating room in  stable condition. He was placed in the supine position. The patient was  previously intubated. The right neck was prepped and draped in a sterile  fashion. Ultrasound of the right neck revealed a patent right internal  jugular vein.   Skin and subcutaneous tissues instilled with 1% Xylocaine. The needle was  easily induced into the right internal jugular vein. A 0.025 J-wire passed  through the needle into the superior vena cava under fluoroscopy. The site  opened with a #11 blade.  Then 12, 14,  and 16 dilators were advanced over  the guidewire. A 16 dilator and tearaway sheath advanced over the guidewire.  The dilator and guidewire were removed. A Diatek catheter was placed through  the sheath to the superior vena cava-right atrial junction. The tearaway  sheath was removed. Subcutaneous tunnel created. The catheter was brought  through the tunnel. The hub of the catheter divided, and the hub mechanism  assembled.  The surgical site was closed with interrupted 3-0 nylon suture.  The catheter was affixed to the skin with interrupted 2-0 silk suture.  Sterile dressings were applied. The catheter was flushed with heparin and  saline solution and capped with heparin. The patient  transferred back to the  intensive care unit for a chest x-ray. No apparent complications.      Balinda Quails, M.D.  Electronically Signed     PGH/MEDQ  D:  06/01/2005  T:  06/01/2005  Job:  [id number]

## 2010-10-01 NOTE — Discharge Summary (Signed)
NAMEARNETT, [name] [name] NO.:  [id number]   MEDICAL RECORD NO.:  [id number]          PATIENT TYPE:  INP   LOCATION:  6734                         FACILITY:  [facility]   PHYSICIAN:  Isidor Holts, M.D.  DATE OF BIRTH:  March 26, 1966   DATE OF ADMISSION:  05/10/2005  DATE OF DISCHARGE:  05/20/2005                                 DISCHARGE SUMMARY   ADDENDUM   PRIMARY MD:  Olene Craven, M.D.   DISCHARGE DIAGNOSIS:  Refer to interim discharge summary on May 14, 2005 by Dr. Michaelyn Barter.   DISCHARGE MEDICATIONS:  1.  Baclofen 20 mg t.i.d.  2.  Phenytoin 200 mg p.o. daily.  3.  Phenytoin 300 mg p.o. q.h.s.  4.  Multivitamin 1 capsule p.o. daily.  5.  Protonix 40 mg p.o. daily.  6.  Flora-Q capsules 1 p.o. t.i.d.  7.  Keflex 500 mg p.o. q.i.d. for 3 days only from May 20, 2005.   NOTE:  The patient's pre-admission antihypertensive i.e. clonidine, Norvasc  have been held secondary to hypotension.   For procedures refer to interim discharge summary dated May 14, 2005.  In addition the following investigations were done:  1.  Chest x-ray dated May 17, 2005 showed right upper extremity PICC to      the cava-atrial junction, probable increased right lower lobe      atelectasis.  2.  Chest CT angiogram dated May 17, 2005 showed no evidence of enlarged      liver or segment of pulmonary embolism, moderate small left pleural      effusion.  Right hilar adenopathy, 2.0 cm most likely reactive.      Increased number of mediastinal lymph nodes likely also reactive,      bibasilar airspace disease.  On the left, this is most consistent with      atelectasis, on the right side, this was likely atelectasis. Early      infection may have this appearance.   For admission history, consultations and detailed clinical course please  refer to the above-mentioned interim discharge summary.  In addition, for  the period dated May 15, 2005 to May 19, 2005 the patient's clinical  condition continued to improve steadily.  Certainly septic shock had  resolved, and the patient was able to maintain oral intake adequately.  Intravenous fluids were reduced initially to 100 mL/h and subsequently  discontinued, following which the patient remained normotensive.  He  continued to have diarrhea, necessitating a utilization of a rectal pouch in  order to avoid contamination of sacral decubiti.  He continues to receive  local care for sacral decubiti which now do not appear infected.  The  lesions are granulating well without any obvious purulence.  The patient has  been plagued by electrolyte abnormalities, including hypokalemia and  hypomagnesemia, secondary to diarrhea. However, these responded to  appropriate supplementation, and by May 19, 2005.  Potassium was 4.5 and  KCl has been discontinued.  Stool samples, sent for C. difficile toxin were  all negative, and the patient is currently on probiotics, i.e. Flora-Q.  He  was observed to have sleep apnea/hypopnea during the course of his hospital  stay, and responded to nocturnal BiPAP.  This was subsequently switched to  nocturnal CPAP on May 18, 2005, and he has tolerated this well.  We  intend to discharge him on CPAP. Pyelonephritis has resolved, as evidenced  by entirely negative urinalysis, done on May 18, 2005, however, it is  thought prudent to continue patient on an additional 5 days of Keflex after  completing 6 days of Rocephin on May 18, 2005.  No seizure episodes were  observed during the course of the patient's hospital stay.  Blood pressure,  after resolution of sepsis has remained within normal limits.  The patient's  pre-admission antihypertensives, which include clonidine and Norvasc have  not been recommenced accordingly.   DISPOSITION:  The patient is considered clinically stable for discharge on  May 20, 2005.  He has surgery, scheduled at [facility] for  May 25, 2005, and I understand that this involves repair of decubiti and also  possible colostomy placement.  It is anticipated that he will still be able  to make his surgical appointment as scheduled, and the patient is agreeable  to this plan.  I have on May 19, 2005 contacted Killen, RN who works with  Dr. Nedra Hai, surgeon, at [facility] and discussed the preoperative  requirements for this patient, also undated her on the patient's  hospitalization and his condition at discharge.  She is aware that we intend  to keep the patient's rectal pouch in place to avoid contamination of sacral  decubiti and she has specifically requested that the patient's PICC be  allowed to remain in situ. In addition, she has requested an updated chest x-  ray, EKG, CBC, prealbumin level and Chem-7.  These investigations have been  carried out accordingly and will be made available at the time of discharge.  NOTE:  RN Misty telephone number is [phone].  Plastic Surgery phone  number is [phone].      Isidor Holts, M.D.  Electronically Signed     CO/MEDQ  D:  05/19/2005  T:  05/19/2005  Job:  [id number]   cc:   FAX:  [phone] GEX:[id number]. [facility] H[facility]  [facility]   Olene Craven, M.D.  Fax: [phone]
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
## 2010-10-01 NOTE — Discharge Summary (Signed)
[name] [name], [name] [name]                 ACCOUNT NO.:  [id number]   MEDICAL RECORD NO.:  [id number]          PATIENT TYPE:  INP   LOCATION:  3035                         FACILITY:  [facility]   PHYSICIAN:  Mobolaji B. Bakare, M.D.DATE OF BIRTH:  [birth year]   DATE OF ADMISSION:  07/25/2006  DATE OF DISCHARGE:  07/29/2006                               DISCHARGE SUMMARY   PRIMARY CARE PHYSICIAN:  Dr. Barbee Shropshire.   PRIMARY DIAGNOSES:  1. Klebsiella urinary tract infection.  2. Probable pneumonia.   SECONDARY DIAGNOSES:  1. Hypertension.  2. Quadriplegia.  3. Seizure disorder.  4. Sacral decubitus.  5. Chronic anemia.  6. Chronic healing stage IV wounds, sacral.   PROCEDURES:  1. Lower extremity Doppler, negative for DVT.  2. Chest x-ray, done on the 11th of March, showed left lower lobe      atelectasis and pneumonia.  No other small left effusion.  3. PICC line insertion done on the 11th of March, 2008.  Showed slight      increase right lower lobe air space disease with PICC line      terminating in a carvo- atria junction.   BRIEF HISTORY:  [name] [name] is a 45 year old African American male who  presented with 1 day history of fever and feeling not well.  A  temperature, taken by caregiver, was 101.  He had a cough, scratchy  throat.  He was recently hospitalized for small bowel obstruction in  early March of 2008 and discharged on the 7th of March.  He had a chest  x-ray, which was suspicious for pneumonia and he was started on  antibiotic for treatment.  Urinalysis was also positive for pyuria and  he was also empirically started on treatment for UTI .  It is noted that  patient has a chronic indwelling Foley catheter.   HOSPITAL COURSE:  1. Febrile illness.  Patient had symptoms of fever and cough subsided      with antibiotic treatment.  He became afebrile within 24 hours.      His O2 saturations have remained normal.  Urine culture came back      growing Klebsiella UTI resistant  to ampicillin, but sensitive to      Levaquin, cefazolin  and ceftriaxone.  Decision was made to switch      the antibiotic to Levaquin 750 mg daily to cover both respiratory      and urinary infection.  2. Hypertension.  Patient's blood pressure was borderline on      admission, 95/51.  His antihypertensives were held.  At the time of      discharge, blood pressure was climbing up 137/90.  He was restarted      on clonidine at a low dose of 0.1 mg b.i.d. to avoid rebound      hypertension.  I have instructed him to restart to full dose and      also restart Norvasc when his blood pressure is trending greater      than 140/80.  3. Decubitus ulcer.  Wound care was continued.  Patient has an Charity fundraiser  that      comes in 3 times a week for wound care.  He will continue with      these at home.  He was seen by wound care physician during this      hospitalization.  He ductibus was appropriately addressed.  He does      have a diversion colostomy.   DISCHARGE CONDITION:  Stable.   Blood pressure 121-190.  Temperature 98.7.  O2 saturation 93% on room  air.   DISCHARGE LABORATORY DATA:  Hemoglobin 9.1, hematocrit 27.3, platelets  233, whites are trending down from 16,000 to 12,000.   DISCHARGE MEDICATIONS:  1. Levaquin 750 mg daily for 6 more days.  2. Reglan 10 mg 3 times a day.  3. Baclofen 30 mg 3 times a day.  4. Norvasc 10 mg daily, to resume when blood pressure is greater than      140/80.  5. Clonidine 0.1 mg 2 times a day.  6. __________ 300 mg daily.  7. __________ 225 mg at bedtime.  8. Pepcid 20 mg daily.  9. Multivitamin 1 daily.   FOLLOWUP:  With Dr. Barbee Shropshire in 1-2 weeks.  Patient will continue with  wound care at home.      Mobolaji B. Corky Downs, M.D.  Electronically Signed     MBB/MEDQ  D:  07/29/2006  T:  07/29/2006  Job:  [id number]   cc:   Olene Craven, M.D.

## 2010-10-02 LAB — BASIC METABOLIC PANEL
Chloride: 104 mEq/L (ref 96–112)
Creatinine, Ser: <0.47 mg/dL (ref 0.4–1.5)
Potassium: 3.8 mEq/L (ref 3.5–5.1)
Sodium: 141 mEq/L (ref 135–145)

## 2010-10-02 LAB — CBC
Platelets: 252 10*3/uL (ref 150–400)
RBC: 3.45 MIL/uL — ABNORMAL LOW (ref 4.22–5.81)
WBC: 10.3 10*3/uL (ref 4.0–10.5)

## 2010-10-02 LAB — VANCOMYCIN, TROUGH: Vancomycin Tr: 14.5 ug/mL (ref 10.0–20.0)

## 2010-10-04 LAB — CBC
HCT: 31.2 % — ABNORMAL LOW (ref 39.0–52.0)
RBC: 3.76 MIL/uL — ABNORMAL LOW (ref 4.22–5.81)
RDW: 15.2 % (ref 11.5–15.5)
WBC: 13 10*3/uL — ABNORMAL HIGH (ref 4.0–10.5)

## 2010-10-04 LAB — BASIC METABOLIC PANEL
Chloride: 101 mEq/L (ref 96–112)
Glucose, Bld: 116 mg/dL — ABNORMAL HIGH (ref 70–99)
Potassium: 3.7 mEq/L (ref 3.5–5.1)
Sodium: 141 mEq/L (ref 135–145)

## 2010-10-05 LAB — CBC
HCT: 29.9 % — ABNORMAL LOW (ref 39.0–52.0)
Hemoglobin: 9.1 g/dL — ABNORMAL LOW (ref 13.0–17.0)
MCH: 25.5 pg — ABNORMAL LOW (ref 26.0–34.0)
MCHC: 30.4 g/dL (ref 30.0–36.0)
MCV: 83.8 fL (ref 78.0–100.0)
Platelets: 303 10*3/uL (ref 150–400)
RBC: 3.57 MIL/uL — ABNORMAL LOW (ref 4.22–5.81)
RDW: 15.2 % (ref 11.5–15.5)
WBC: 11.1 10*3/uL — ABNORMAL HIGH (ref 4.0–10.5)

## 2010-10-05 LAB — CULTURE, BLOOD (SINGLE)
Culture  Setup Time: 201205160331
Culture: NO GROWTH
Culture: NO GROWTH

## 2010-10-05 LAB — BASIC METABOLIC PANEL
BUN: 13 mg/dL (ref 6–23)
CO2: 34 mEq/L — ABNORMAL HIGH (ref 19–32)
Calcium: 9.4 mg/dL (ref 8.4–10.5)
Chloride: 102 mEq/L (ref 96–112)
Creatinine, Ser: <0.47 mg/dL (ref 0.4–1.5)
Glucose, Bld: 143 mg/dL — ABNORMAL HIGH (ref 70–99)
Potassium: 3.8 mEq/L (ref 3.5–5.1)
Sodium: 143 mEq/L (ref 135–145)
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
## 2010-10-20 ENCOUNTER — Encounter (HOSPITAL_BASED_OUTPATIENT_CLINIC_OR_DEPARTMENT_OTHER): Payer: Medicare Other | Attending: Plastic Surgery

## 2010-10-20 DIAGNOSIS — Z79899 Other long term (current) drug therapy: Secondary | ICD-10-CM | POA: Insufficient documentation

## 2010-10-20 DIAGNOSIS — I10 Essential (primary) hypertension: Secondary | ICD-10-CM | POA: Insufficient documentation

## 2010-10-20 DIAGNOSIS — L8990 Pressure ulcer of unspecified site, unspecified stage: Secondary | ICD-10-CM | POA: Insufficient documentation

## 2010-10-20 DIAGNOSIS — L89109 Pressure ulcer of unspecified part of back, unspecified stage: Secondary | ICD-10-CM | POA: Insufficient documentation

## 2010-10-20 DIAGNOSIS — L89309 Pressure ulcer of unspecified buttock, unspecified stage: Secondary | ICD-10-CM | POA: Insufficient documentation

## 2010-10-20 DIAGNOSIS — G8250 Quadriplegia, unspecified: Secondary | ICD-10-CM | POA: Insufficient documentation

## 2010-10-26 ENCOUNTER — Emergency Department (HOSPITAL_COMMUNITY): Payer: Medicare Other

## 2010-10-26 ENCOUNTER — Encounter (HOSPITAL_COMMUNITY): Payer: Self-pay

## 2010-10-26 ENCOUNTER — Inpatient Hospital Stay (HOSPITAL_COMMUNITY)
Admission: EM | Admit: 2010-10-26 | Discharge: 2010-11-01 | DRG: 193 | Disposition: A | Discharge status: Home Health | Payer: Medicare Other | Attending: Internal Medicine | Admitting: Internal Medicine

## 2010-10-26 DIAGNOSIS — A498 Other bacterial infections of unspecified site: Secondary | ICD-10-CM | POA: Diagnosis present

## 2010-10-26 DIAGNOSIS — R0600 Dyspnea, unspecified: Secondary | ICD-10-CM

## 2010-10-26 DIAGNOSIS — I5031 Acute diastolic (congestive) heart failure: Secondary | ICD-10-CM | POA: Diagnosis present

## 2010-10-26 DIAGNOSIS — IMO0002 Reserved for concepts with insufficient information to code with codable children: Secondary | ICD-10-CM

## 2010-10-26 DIAGNOSIS — J189 Pneumonia, unspecified organism: Principal | ICD-10-CM | POA: Diagnosis present

## 2010-10-26 DIAGNOSIS — Z87440 Personal history of urinary (tract) infections: Secondary | ICD-10-CM

## 2010-10-26 DIAGNOSIS — G4733 Obstructive sleep apnea (adult) (pediatric): Secondary | ICD-10-CM | POA: Diagnosis present

## 2010-10-26 DIAGNOSIS — N390 Urinary tract infection, site not specified: Secondary | ICD-10-CM | POA: Diagnosis present

## 2010-10-26 DIAGNOSIS — L8994 Pressure ulcer of unspecified site, stage 4: Secondary | ICD-10-CM | POA: Diagnosis present

## 2010-10-26 DIAGNOSIS — L89109 Pressure ulcer of unspecified part of back, unspecified stage: Secondary | ICD-10-CM | POA: Diagnosis present

## 2010-10-26 DIAGNOSIS — I10 Essential (primary) hypertension: Secondary | ICD-10-CM | POA: Diagnosis present

## 2010-10-26 DIAGNOSIS — I509 Heart failure, unspecified: Secondary | ICD-10-CM | POA: Diagnosis present

## 2010-10-26 DIAGNOSIS — M8668 Other chronic osteomyelitis, other site: Secondary | ICD-10-CM | POA: Diagnosis present

## 2010-10-26 DIAGNOSIS — Z8701 Personal history of pneumonia (recurrent): Secondary | ICD-10-CM

## 2010-10-26 DIAGNOSIS — E662 Morbid (severe) obesity with alveolar hypoventilation: Secondary | ICD-10-CM | POA: Diagnosis present

## 2010-10-26 DIAGNOSIS — D638 Anemia in other chronic diseases classified elsewhere: Secondary | ICD-10-CM | POA: Diagnosis present

## 2010-10-26 DIAGNOSIS — G8250 Quadriplegia, unspecified: Secondary | ICD-10-CM | POA: Diagnosis present

## 2010-10-26 DIAGNOSIS — I959 Hypotension, unspecified: Secondary | ICD-10-CM | POA: Diagnosis present

## 2010-10-26 DIAGNOSIS — B9689 Other specified bacterial agents as the cause of diseases classified elsewhere: Secondary | ICD-10-CM | POA: Diagnosis present

## 2010-10-26 DIAGNOSIS — G40909 Epilepsy, unspecified, not intractable, without status epilepticus: Secondary | ICD-10-CM | POA: Diagnosis present

## 2010-10-26 DIAGNOSIS — J9620 Acute and chronic respiratory failure, unspecified whether with hypoxia or hypercapnia: Secondary | ICD-10-CM | POA: Diagnosis present

## 2010-10-26 LAB — DIFFERENTIAL
Basophils Absolute: 0 K/uL (ref 0.0–0.1)
Basophils Relative: 0 % (ref 0–1)
Eosinophils Absolute: 0.4 K/uL (ref 0.0–0.7)
Eosinophils Relative: 4 % (ref 0–5)
Lymphocytes Relative: 16 % (ref 12–46)
Lymphs Abs: 1.5 10*3/uL (ref 0.7–4.0)
Monocytes Absolute: 1.1 10*3/uL — ABNORMAL HIGH (ref 0.1–1.0)
Monocytes Relative: 12 % (ref 3–12)
Neutro Abs: 6.3 K/uL (ref 1.7–7.7)
Neutrophils Relative %: 68 % (ref 43–77)

## 2010-10-26 LAB — COMPREHENSIVE METABOLIC PANEL
BUN: 16 mg/dL (ref 6–23)
CO2: 24 mEq/L (ref 19–32)
Calcium: 8.8 mg/dL (ref 8.4–10.5)
Creatinine, Ser: <0.47 mg/dL (ref 0.4–1.5)
Glucose, Bld: 98 mg/dL (ref 70–99)
Total Protein: 7.7 g/dL (ref 6.0–8.3)

## 2010-10-26 LAB — URINE MICROSCOPIC-ADD ON

## 2010-10-26 LAB — CBC
HCT: 32.7 % — ABNORMAL LOW (ref 39.0–52.0)
Hemoglobin: 10.3 g/dL — ABNORMAL LOW (ref 13.0–17.0)
MCH: 25.8 pg — ABNORMAL LOW (ref 26.0–34.0)
MCHC: 31.5 g/dL (ref 30.0–36.0)
MCV: 82 fL (ref 78.0–100.0)
Platelets: 236 K/uL (ref 150–400)
RBC: 3.99 MIL/uL — ABNORMAL LOW (ref 4.22–5.81)
RDW: 15.8 % — ABNORMAL HIGH (ref 11.5–15.5)
WBC: 9.3 K/uL (ref 4.0–10.5)

## 2010-10-26 LAB — BASIC METABOLIC PANEL WITH GFR
BUN: 19 mg/dL (ref 6–23)
Creatinine, Ser: <0.47 mg/dL (ref 0.4–1.5)

## 2010-10-26 LAB — URINALYSIS, ROUTINE W REFLEX MICROSCOPIC
Hgb urine dipstick: NEGATIVE
Nitrite: POSITIVE — AB
Protein, ur: 30 mg/dL — AB
Specific Gravity, Urine: 1.03 (ref 1.005–1.030)
Urobilinogen, UA: 0.2 mg/dL (ref 0.0–1.0)

## 2010-10-26 LAB — BASIC METABOLIC PANEL
CO2: 25 mEq/L (ref 19–32)
Calcium: 9.7 mg/dL (ref 8.4–10.5)
Chloride: 101 mEq/L (ref 96–112)
Glucose, Bld: 115 mg/dL — ABNORMAL HIGH (ref 70–99)
Potassium: 4 mEq/L (ref 3.5–5.1)
Sodium: 135 mEq/L (ref 135–145)

## 2010-10-26 LAB — MRSA PCR SCREENING: MRSA by PCR: NEGATIVE

## 2010-10-26 LAB — MAGNESIUM: Magnesium: 1.9 mg/dL (ref 1.5–2.5)

## 2010-10-26 LAB — PROTIME-INR: Prothrombin Time: 14.6 seconds (ref 11.6–15.2)

## 2010-10-26 LAB — STREP PNEUMONIAE URINARY ANTIGEN: Strep Pneumo Urinary Antigen: NEGATIVE

## 2010-10-26 LAB — PROCALCITONIN: Procalcitonin: <0.1 ng/mL

## 2010-10-26 LAB — LACTIC ACID, PLASMA: Lactic Acid, Venous: 1.2 mmol/L (ref 0.5–2.2)

## 2010-10-26 MED ORDER — IOHEXOL 300 MG/ML  SOLN
80.0000 mL | Freq: Once | INTRAMUSCULAR | Status: AC | PRN
  Administered 2010-10-26: 80 mL via INTRAVENOUS

## 2010-10-27 LAB — BLOOD GAS, ARTERIAL
Delivery systems: POSITIVE
Drawn by: 347861
Mode: POSITIVE
O2 Saturation: 93.9 %
Patient temperature: 98.6
pH, Arterial: 7.334 — ABNORMAL LOW (ref 7.350–7.450)
pO2, Arterial: 67 mmHg — ABNORMAL LOW (ref 80.0–100.0)

## 2010-10-27 LAB — DIFFERENTIAL
Basophils Absolute: 0 10*3/uL (ref 0.0–0.1)
Basophils Relative: 0 % (ref 0–1)
Lymphocytes Relative: 16 % (ref 12–46)
Monocytes Absolute: 0.8 10*3/uL (ref 0.1–1.0)
Neutro Abs: 3.7 10*3/uL (ref 1.7–7.7)
Neutrophils Relative %: 63 % (ref 43–77)

## 2010-10-27 LAB — CBC
Hemoglobin: 9.6 g/dL — ABNORMAL LOW (ref 13.0–17.0)
MCHC: 31.4 g/dL (ref 30.0–36.0)
WBC: 5.8 10*3/uL (ref 4.0–10.5)

## 2010-10-27 LAB — BASIC METABOLIC PANEL
Chloride: 105 mEq/L (ref 96–112)
Glucose, Bld: 123 mg/dL — ABNORMAL HIGH (ref 70–99)
Potassium: 4 mEq/L (ref 3.5–5.1)
Sodium: 136 mEq/L (ref 135–145)
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
## 2010-10-28 ENCOUNTER — Inpatient Hospital Stay (HOSPITAL_COMMUNITY): Payer: Medicare Other

## 2010-10-28 LAB — BASIC METABOLIC PANEL
Calcium: 8.5 mg/dL (ref 8.4–10.5)
Glucose, Bld: 98 mg/dL (ref 70–99)
Sodium: 140 mEq/L (ref 135–145)

## 2010-10-28 LAB — URINE CULTURE
Colony Count: 40000
Culture  Setup Time: 201206130140
Special Requests: NEGATIVE

## 2010-10-28 LAB — CBC
MCH: 25.4 pg — ABNORMAL LOW (ref 26.0–34.0)
MCHC: 30.1 g/dL (ref 30.0–36.0)
Platelets: 194 10*3/uL (ref 150–400)

## 2010-10-29 ENCOUNTER — Inpatient Hospital Stay (HOSPITAL_COMMUNITY): Payer: Medicare Other

## 2010-10-29 LAB — BASIC METABOLIC PANEL
BUN: 8 mg/dL (ref 6–23)
Potassium: 3.7 mEq/L (ref 3.5–5.1)
Sodium: 140 mEq/L (ref 135–145)

## 2010-10-30 LAB — CBC
HCT: 30.2 % — ABNORMAL LOW (ref 39.0–52.0)
Hemoglobin: 8.8 g/dL — ABNORMAL LOW (ref 13.0–17.0)
MCHC: 29.1 g/dL — ABNORMAL LOW (ref 30.0–36.0)
MCV: 85.8 fL (ref 78.0–100.0)
WBC: 6.6 10*3/uL (ref 4.0–10.5)

## 2010-10-30 LAB — BASIC METABOLIC PANEL
BUN: 10 mg/dL (ref 6–23)
Chloride: 106 mEq/L (ref 96–112)
Creatinine, Ser: <0.47 mg/dL — ABNORMAL LOW (ref 0.50–1.35)
Glucose, Bld: 114 mg/dL — ABNORMAL HIGH (ref 70–99)
Potassium: 3.5 mEq/L (ref 3.5–5.1)

## 2010-10-31 ENCOUNTER — Inpatient Hospital Stay (HOSPITAL_COMMUNITY): Payer: Medicare Other

## 2010-10-31 DIAGNOSIS — I519 Heart disease, unspecified: Secondary | ICD-10-CM

## 2010-10-31 LAB — URINE MICROSCOPIC-ADD ON

## 2010-10-31 LAB — BASIC METABOLIC PANEL
BUN: 9 mg/dL (ref 6–23)
Calcium: 8.9 mg/dL (ref 8.4–10.5)
Creatinine, Ser: <0.47 mg/dL — ABNORMAL LOW (ref 0.50–1.35)
Glucose, Bld: 123 mg/dL — ABNORMAL HIGH (ref 70–99)

## 2010-10-31 LAB — URINALYSIS, ROUTINE W REFLEX MICROSCOPIC
Bilirubin Urine: NEGATIVE
Hgb urine dipstick: NEGATIVE
Ketones, ur: NEGATIVE mg/dL
Protein, ur: NEGATIVE mg/dL
Specific Gravity, Urine: 1.011 (ref 1.005–1.030)
Urobilinogen, UA: 0.2 mg/dL (ref 0.0–1.0)

## 2010-10-31 LAB — CBC
HCT: 29.5 % — ABNORMAL LOW (ref 39.0–52.0)
Hemoglobin: 8.8 g/dL — ABNORMAL LOW (ref 13.0–17.0)
MCH: 25.6 pg — ABNORMAL LOW (ref 26.0–34.0)
MCHC: 29.8 g/dL — ABNORMAL LOW (ref 30.0–36.0)

## 2010-11-01 LAB — CULTURE, BLOOD (ROUTINE X 2)
Culture  Setup Time: 201206120858
Culture  Setup Time: 201206120852
Culture: NO GROWTH
Culture: NO GROWTH

## 2010-11-01 LAB — BASIC METABOLIC PANEL
Glucose, Bld: 110 mg/dL — ABNORMAL HIGH (ref 70–99)
Potassium: 3.4 mEq/L — ABNORMAL LOW (ref 3.5–5.1)
Sodium: 144 mEq/L (ref 135–145)

## 2010-11-02 LAB — URINE CULTURE
Colony Count: NO GROWTH
Culture  Setup Time: 201206180018
Culture: NO GROWTH
Special Requests: NEGATIVE

## 2010-11-05 ENCOUNTER — Emergency Department (HOSPITAL_COMMUNITY): Payer: Medicare Other

## 2010-11-05 ENCOUNTER — Emergency Department (HOSPITAL_COMMUNITY)
Admission: EM | Admit: 2010-11-05 | Discharge: 2010-11-05 | Disposition: A | Discharge status: Home / Self Care | Payer: Medicare Other | Attending: Emergency Medicine | Admitting: Emergency Medicine

## 2010-11-05 DIAGNOSIS — Z933 Colostomy status: Secondary | ICD-10-CM | POA: Insufficient documentation

## 2010-11-05 DIAGNOSIS — R0602 Shortness of breath: Secondary | ICD-10-CM | POA: Insufficient documentation

## 2010-11-05 DIAGNOSIS — G8250 Quadriplegia, unspecified: Secondary | ICD-10-CM | POA: Insufficient documentation

## 2010-11-05 DIAGNOSIS — J40 Bronchitis, not specified as acute or chronic: Secondary | ICD-10-CM | POA: Insufficient documentation

## 2010-11-05 DIAGNOSIS — Z87440 Personal history of urinary (tract) infections: Secondary | ICD-10-CM | POA: Insufficient documentation

## 2010-11-05 DIAGNOSIS — R05 Cough: Secondary | ICD-10-CM | POA: Insufficient documentation

## 2010-11-05 DIAGNOSIS — I10 Essential (primary) hypertension: Secondary | ICD-10-CM | POA: Insufficient documentation

## 2010-11-05 DIAGNOSIS — R059 Cough, unspecified: Secondary | ICD-10-CM | POA: Insufficient documentation

## 2010-11-05 DIAGNOSIS — G40909 Epilepsy, unspecified, not intractable, without status epilepticus: Secondary | ICD-10-CM | POA: Insufficient documentation

## 2010-11-05 LAB — BASIC METABOLIC PANEL
Calcium: 10 mg/dL (ref 8.4–10.5)
Sodium: 140 mEq/L (ref 135–145)

## 2010-11-05 LAB — DIFFERENTIAL
Basophils Relative: 0 % (ref 0–1)
Eosinophils Absolute: 0.3 10*3/uL (ref 0.0–0.7)
Monocytes Absolute: 0.9 10*3/uL (ref 0.1–1.0)
Monocytes Relative: 8 % (ref 3–12)
Neutrophils Relative %: 65 % (ref 43–77)

## 2010-11-05 LAB — CBC
MCH: 25.4 pg — ABNORMAL LOW (ref 26.0–34.0)
MCHC: 30.7 g/dL (ref 30.0–36.0)
Platelets: 318 10*3/uL (ref 150–400)
RDW: 16.1 % — ABNORMAL HIGH (ref 11.5–15.5)

## 2010-11-07 LAB — CULTURE, BLOOD (ROUTINE X 2)
Culture  Setup Time: 201206180043
Culture: NO GROWTH
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
## 2010-12-23 ENCOUNTER — Encounter: Payer: Self-pay | Admitting: Internal Medicine

## 2010-12-24 ENCOUNTER — Ambulatory Visit (INDEPENDENT_AMBULATORY_CARE_PROVIDER_SITE_OTHER): Payer: Medicare Other | Admitting: Internal Medicine

## 2010-12-24 VITALS — BP 116/76 | HR 84 | Temp 97.5°F | Wt 230.0 lb

## 2010-12-24 DIAGNOSIS — G4730 Sleep apnea, unspecified: Secondary | ICD-10-CM

## 2010-12-24 DIAGNOSIS — G8250 Quadriplegia, unspecified: Secondary | ICD-10-CM

## 2010-12-24 DIAGNOSIS — J189 Pneumonia, unspecified organism: Secondary | ICD-10-CM | POA: Insufficient documentation

## 2010-12-24 DIAGNOSIS — G4733 Obstructive sleep apnea (adult) (pediatric): Secondary | ICD-10-CM | POA: Insufficient documentation

## 2010-12-24 NOTE — Assessment & Plan Note (Signed)
This is the larger issue. Will set him up with sleep doc in our office who should be able to manage him comprehensively for pulmonary issues too
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
## 2010-12-24 NOTE — Patient Instructions (Signed)
We wil try to set up a vibratory vest for you to help with mucus clearance Also, I will refer you to sleep doctor in our office for sleep apnea re-eval and titration of cpap/bipap treatment Followup sleep doctor

## 2011-01-11 ENCOUNTER — Institutional Professional Consult (permissible substitution): Payer: Medicare Other | Admitting: Pulmonary Disease

## 2011-01-24 ENCOUNTER — Institutional Professional Consult (permissible substitution): Payer: Medicare Other | Admitting: Pulmonary Disease

## 2011-01-26 ENCOUNTER — Encounter (HOSPITAL_BASED_OUTPATIENT_CLINIC_OR_DEPARTMENT_OTHER): Payer: Medicare Other | Attending: Plastic Surgery

## 2011-02-02 IMAGING — CR DG CHEST 2V
3 series · 3 of 3 positions shown · non-contrast
Comparison: 10/29/2007

CLINICAL DATA: Dysuria.

CHEST - 2 VIEW

[w chest lat * (1 of 2)]
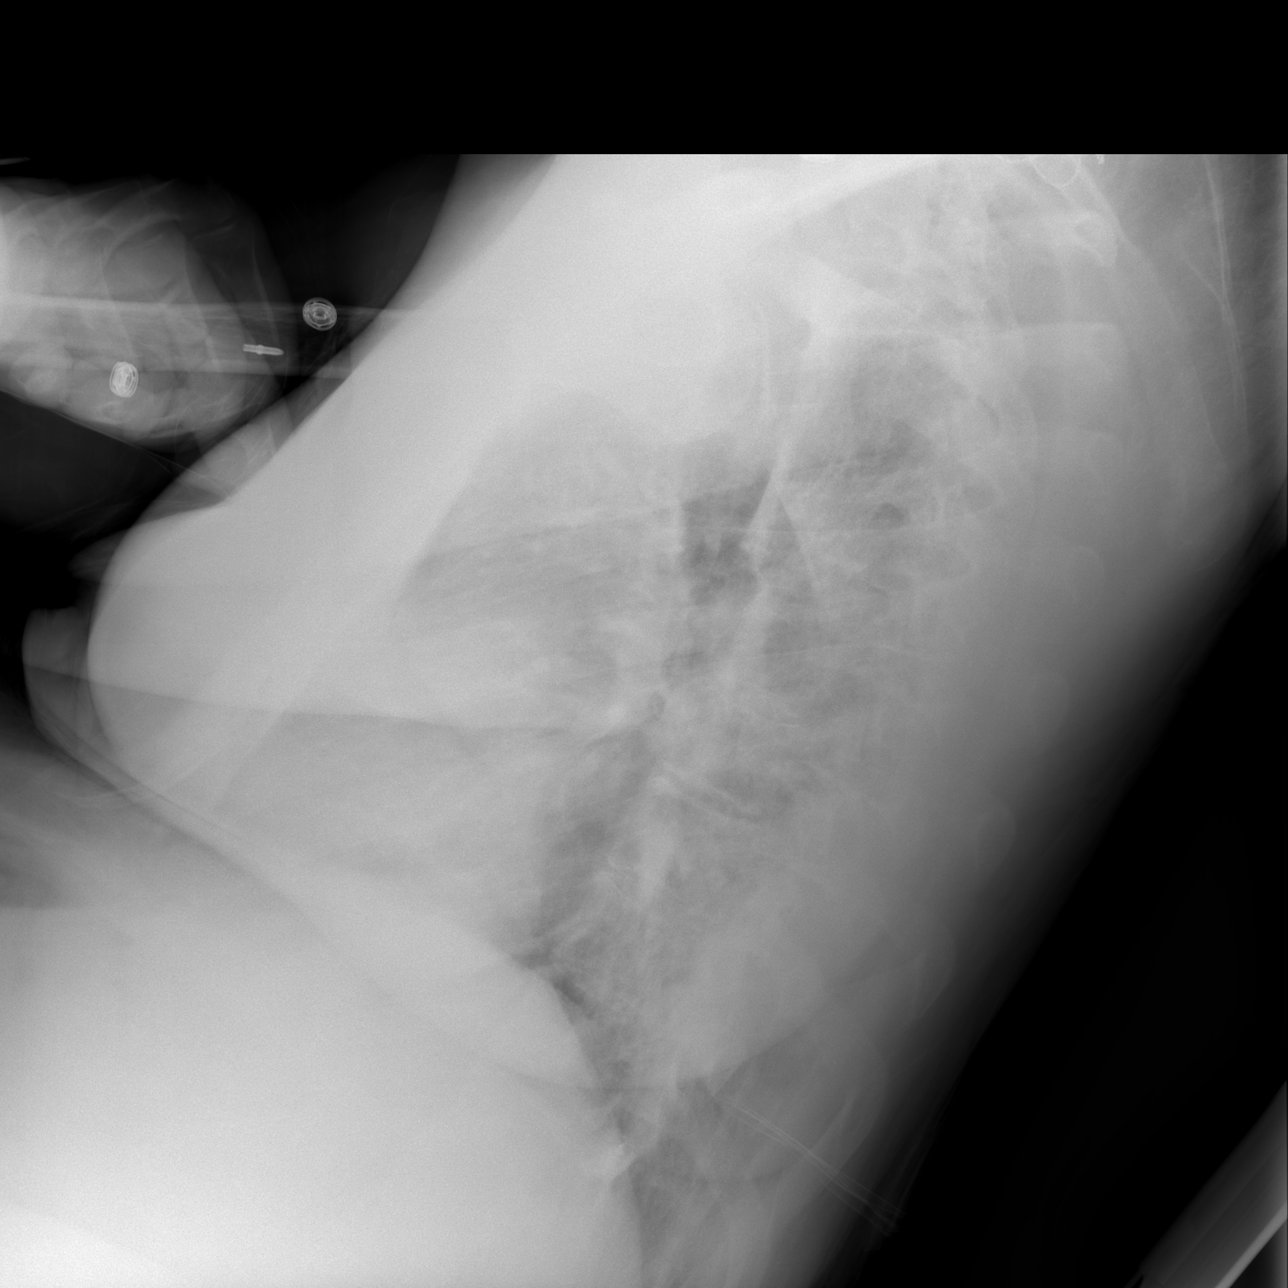

[w chest lat * (2 of 2)]
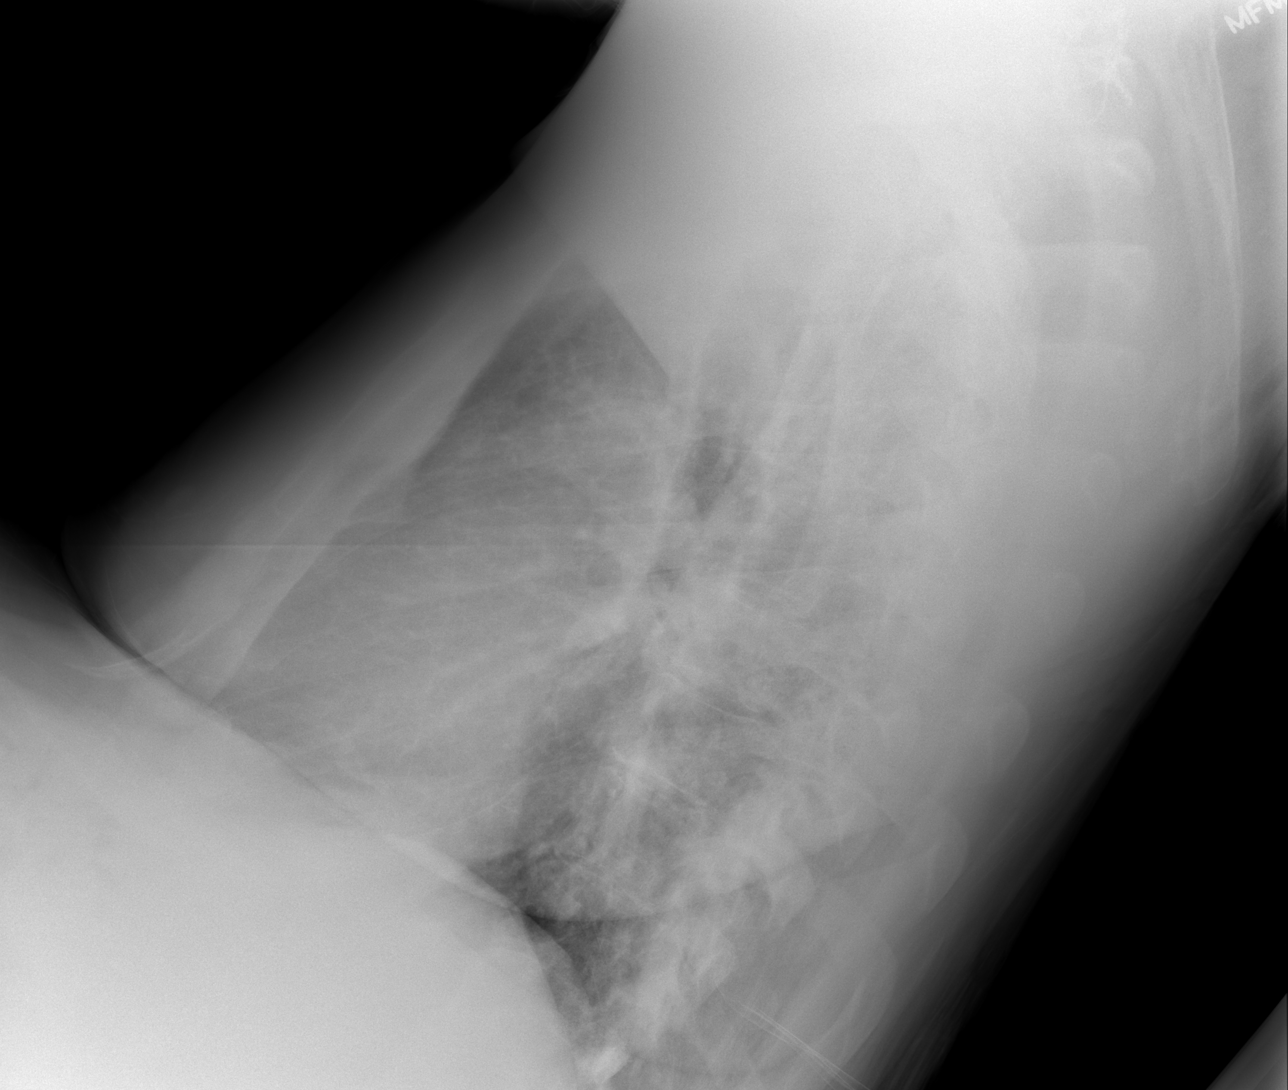

[view not recorded]
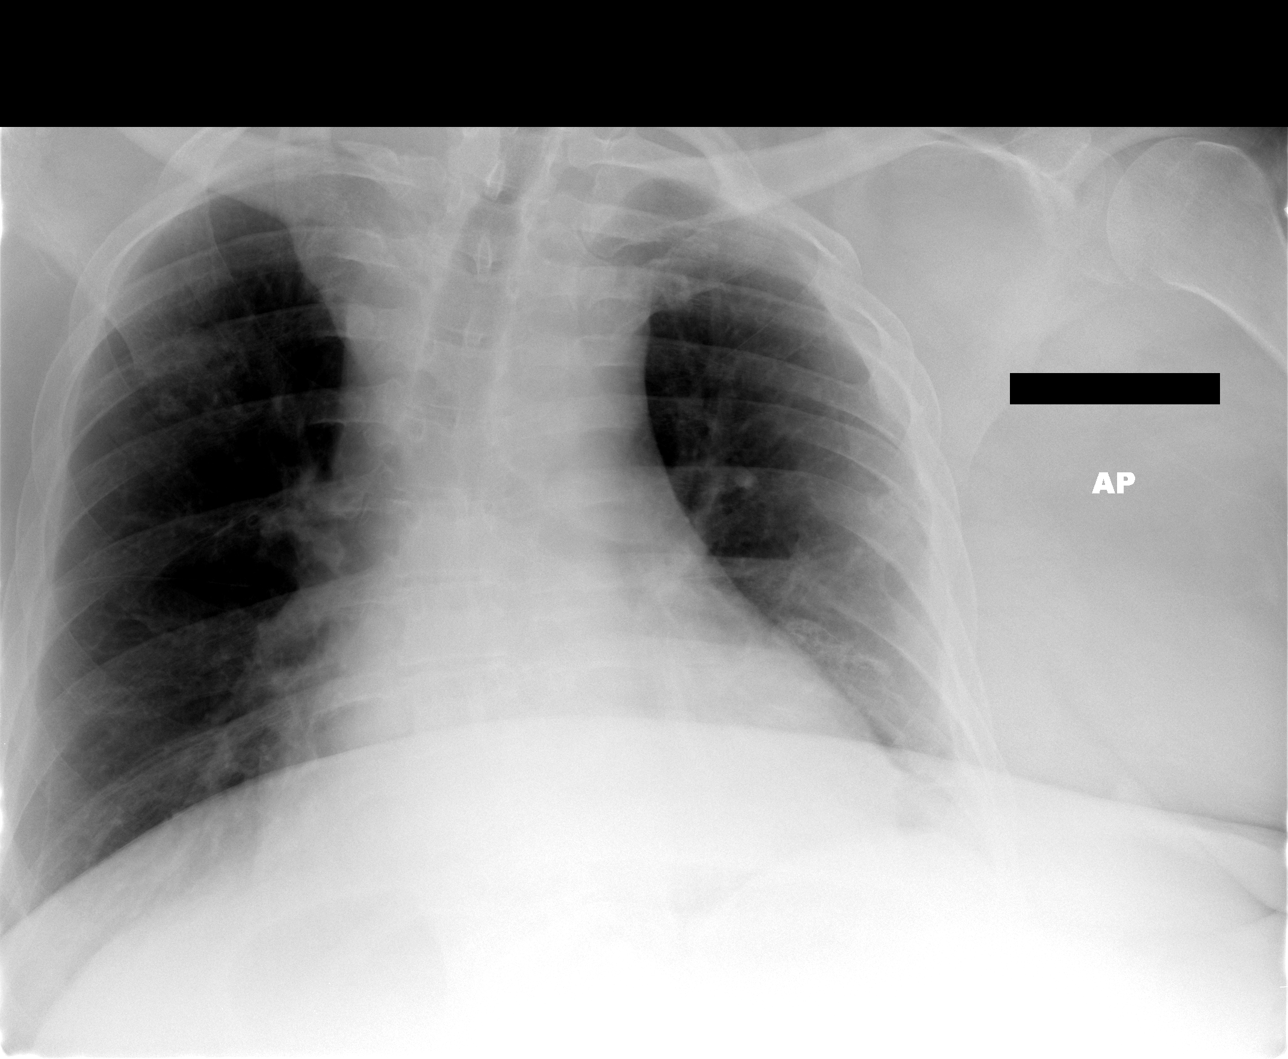

[3 of 3 positions shown; findings below may reference images not displayed]

FINDINGS: Cardiomegaly.  Negative for pneumonia.  No acute chest
findings.  Suggestion of synostosis between ribs six and seven and
seven and eight on the left.
IMPRESSION: Cardiomegaly.  Negative for pneumonia.[REDACTED] DICTATED: 06/20/2008 [DATE]

## 2011-02-04 ENCOUNTER — Encounter: Payer: Self-pay | Admitting: Pulmonary Disease

## 2011-02-04 ENCOUNTER — Ambulatory Visit (INDEPENDENT_AMBULATORY_CARE_PROVIDER_SITE_OTHER): Payer: Medicare Other | Admitting: Pulmonary Disease

## 2011-02-04 VITALS — BP 124/76 | HR 98 | Temp 98.1°F

## 2011-02-04 DIAGNOSIS — G4733 Obstructive sleep apnea (adult) (pediatric): Secondary | ICD-10-CM

## 2011-02-04 LAB — I-STAT 8, (EC8 V) (CONVERTED LAB)
BUN: 20
Bicarbonate: 27.3 — ABNORMAL HIGH
Chloride: 108
HCT: 40
Hemoglobin: 13.6
Operator id: 151321
Sodium: 140

## 2011-02-04 LAB — POCT CARDIAC MARKERS
Myoglobin, poc: 89.5
Operator id: 151321
Operator id: 151321
Troponin i, poc: <0.05

## 2011-02-04 LAB — URINE CULTURE: Colony Count: >=100000

## 2011-02-04 LAB — PROTIME-INR
INR: 1.1
Prothrombin Time: 13.9

## 2011-02-04 LAB — URINALYSIS, ROUTINE W REFLEX MICROSCOPIC
Ketones, ur: NEGATIVE
Nitrite: NEGATIVE
Urobilinogen, UA: 0.2
pH: 6.5

## 2011-02-04 LAB — URINE MICROSCOPIC-ADD ON

## 2011-02-04 NOTE — Patient Instructions (Signed)
Will have your dme get you a new mask Will re-optimize your pressure on auto machine for 2 weeks, then will let you know your optimal pressure from the download. If doing well with your sleep apnea, will see you back in one year.

## 2011-02-04 NOTE — Progress Notes (Signed)
Subjective:    Patient ID: [name] [name], male    DOB: 02-14-1966, 45 y.o.   MRN: [id number]  HPI The patient is a 45 year old male who has been referred for evaluation of obstructive sleep apnea.  The patient was diagnosed 4 years ago with sleep apnea, and has been on CPAP compliantly since that time.  Overall, he has done very well with the device.  He was recently admitted to the hospital with pneumonia, and during that time he had issues with nocturnal hypoxemia.  The question was raised whether he was on appropriate pressure at home, and he admits that he has gained weight since his initial sleep study.  Overall, the patient feels that he is rested in the mornings upon arising, and is satisfied with his alertness level during the day.  His Epworth score is only 8 today.  Sleep Questionnaire:   What time do you typically go to bed?( Between what hours)  11 pm         How long does it take you to fall asleep?  10 to 15 mins         How many times during the night do you wake up?  2         What time do you get out of bed to start your day?  0700         Do you drive or operate heavy machinery in your occupation?  No         How much has your weight changed (up or down) over the past two years? (In pounds)  30 lb (13.608 kg)         Have you ever had a sleep study before?   Yes         If yes, location of study?  [facility]         If yes, date of study?  2008         Do you currently use CPAP?  Yes         If so, what pressure?  unsure of pressure         Do you wear oxygen at any time?  No              Review of Systems  Constitutional: Positive for unexpected weight change. Negative for fever.  HENT: Negative for ear pain, nosebleeds, congestion, sore throat, rhinorrhea, sneezing, trouble swallowing, dental problem, postnasal drip and sinus pressure.   Eyes: Negative for redness and itching.  Respiratory: Negative for cough, chest tightness, shortness of breath and wheezing.     Cardiovascular: Positive for leg swelling. Negative for palpitations.  Gastrointestinal: Negative for nausea and vomiting.  Genitourinary: Negative for dysuria.  Musculoskeletal: Negative for joint swelling.  Skin: Negative for rash.  Neurological: Negative for headaches.  Hematological: Does not bruise/bleed easily.  Psychiatric/Behavioral: Negative for dysphoric mood. The patient is not nervous/anxious.        Objective:   Physical Exam Constitutional:  Morbidly obese male, no acute distress  HENT:  Nares patent without discharge  Oropharynx without exudate, palate and uvula are thick and long  Eyes:  Perrla, eomi, no scleral icterus  Neck:  No JVD, no TMG  Cardiovascular:  Normal rate, regular rhythm, no rubs or gallops.  No murmurs        Intact distal pulses  Pulmonary :  Normal breath sounds, no stridor or respiratory distress   No rales, rhonchi, or wheezing  Abdominal:  Soft, nondistended,  bowel sounds present.  No tenderness noted.   Musculoskeletal:  2+ lower extremity edema noted.  Lymph Nodes:  No cervical lymphadenopathy noted  Skin:  No cyanosis noted  Neurologic:  Alert, appropriate, no movement in LE.  Upper with limited movement.          Assessment & Plan:

## 2011-02-04 NOTE — Assessment & Plan Note (Signed)
The patient has a history of sleep apnea that was diagnosed 4 years ago, and overall has been doing well.  He has kept up with mask changes and supplies, but is due for a new mask at this time.  He has gained weight since his last sleep study, and the question has been raised whether he may need higher pressure.  It will be very easy to address this issue with an AutoSet at home for the next 2 weeks.  I will let him know that his optimal pressure.  If he is doing well, he will followup with me in one year.

## 2011-02-05 IMAGING — US IR US GUIDE VASC ACCESS RIGHT
1 series · 1 of 1 positions shown · non-contrast
Comparison: 

CLINICAL DATA: Urinary tract infection, MRSA,  sacral decubiti,
poor venous access; request is made for central venous access for
fluids and  medications.

RIGHT UPPER EXTREMITY PICC PLACEMENT WITH ULTRASOUND AND FLUORO
GUIDANCE
TECHNIQUE: The right  arm was prepped with chlorhexidine, draped in
the usual sterile fashion using maximum barrier technique and
infiltrated locally with 1% Lidocaine.  Ultrasound demonstrated
patency of the right basilic vein.  Under real-time ultrasound
guidance, this vein was accessed with a 21 gauge micropuncture
needle.  Ultrasound image documentation was performed.  The needle
was exchanged over a guidewire for a peel-away sheath through which
a 5 French single lumen PICC trimmed to 40cm was advanced,
positioned with its tip at the distal SVC/right atrial junction.
Fluoroscopy during the procedure and fluoro spot radiograph
confirms appropriate catheter position.  The catheter was flushed,
secured to the skin with Prolene sutures, and covered with a
sterile dressing.  No immediate complication.
Fluoroscopy Time: 2.6 minutes.

[Series 1: sp us guide vasc access*right* · 1 of 1 slices shown]
[im 1/1]
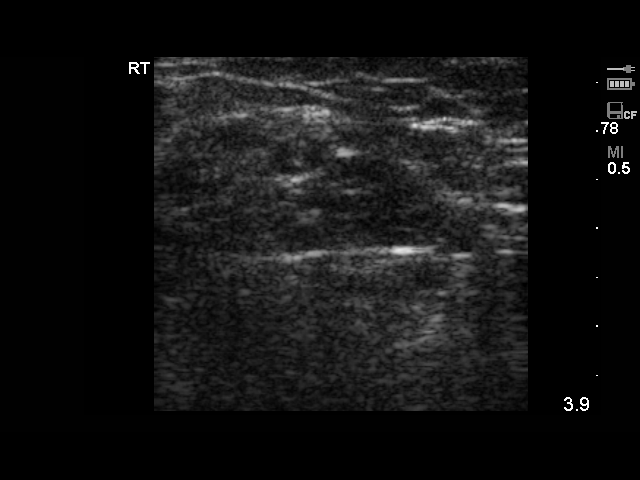

[1 of 1 positions shown; findings below may reference images not displayed]

IMPRESSION: Technically successful right  arm PICC placement with ultrasound
and fluoroscopic guidance.  The catheter is ready for use.

Read by: Fraire, Kashyap.-AKASH

ULTRASOUND VENOUS ACCESS,CENTRAL VENOUS CATHETER WITH FLUOROSCOPY
FINDINGS: 
IMPRESSION: REF:W2 DICTATED: 06/23/2008 [DATE]

## 2011-02-05 IMAGING — RF IR FLUORO GUIDE CV LINE*R*
1 series · 1 of 1 positions shown · non-contrast
Comparison: 

CLINICAL DATA: Urinary tract infection, MRSA,  sacral decubiti,
poor venous access; request is made for central venous access for
fluids and  medications.

RIGHT UPPER EXTREMITY PICC PLACEMENT WITH ULTRASOUND AND FLUORO
GUIDANCE
TECHNIQUE: The right  arm was prepped with chlorhexidine, draped in
the usual sterile fashion using maximum barrier technique and
infiltrated locally with 1% Lidocaine.  Ultrasound demonstrated
patency of the right basilic vein.  Under real-time ultrasound
guidance, this vein was accessed with a 21 gauge micropuncture
needle.  Ultrasound image documentation was performed.  The needle
was exchanged over a guidewire for a peel-away sheath through which
a 5 French single lumen PICC trimmed to 40cm was advanced,
positioned with its tip at the distal SVC/right atrial junction.
Fluoroscopy during the procedure and fluoro spot radiograph
confirms appropriate catheter position.  The catheter was flushed,
secured to the skin with Prolene sutures, and covered with a
sterile dressing.  No immediate complication.
Fluoroscopy Time: 2.6 minutes.

[Series 1: run · 1 of 1 slices shown]
[im 1/1]
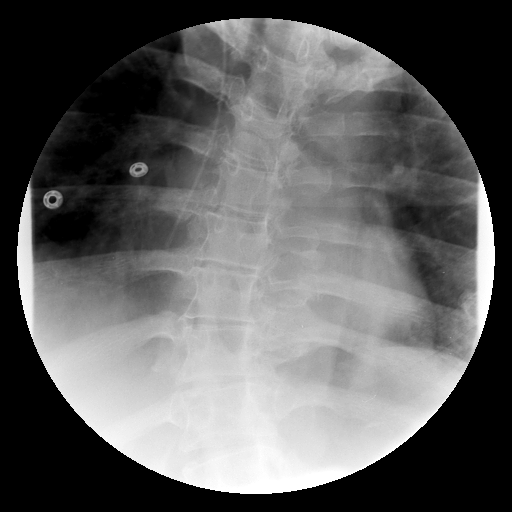

[1 of 1 positions shown; findings below may reference images not displayed]

IMPRESSION: Technically successful right  arm PICC placement with ultrasound
and fluoroscopic guidance.  The catheter is ready for use.

Read by: Fraire, Kashyap.-AKASH

ULTRASOUND VENOUS ACCESS,CENTRAL VENOUS CATHETER WITH FLUOROSCOPY
FINDINGS: 
IMPRESSION: REF:W2 DICTATED: 06/23/2008 [DATE]

## 2011-02-10 LAB — DIFFERENTIAL
Basophils Absolute: 0
Lymphocytes Relative: 7 — ABNORMAL LOW
Lymphs Abs: 1
Neutro Abs: 11.7 — ABNORMAL HIGH
Neutrophils Relative %: 84 — ABNORMAL HIGH

## 2011-02-10 LAB — CBC
HCT: 26.6 — ABNORMAL LOW
HCT: 27.6 — ABNORMAL LOW
HCT: 33.7 — ABNORMAL LOW
HCT: 26.7 — ABNORMAL LOW
HCT: 27.7 — ABNORMAL LOW
HCT: 27.8 — ABNORMAL LOW
HCT: 28.6 — ABNORMAL LOW
Hemoglobin: 8.6 — ABNORMAL LOW
Hemoglobin: 8.5 — ABNORMAL LOW
Hemoglobin: 9 — ABNORMAL LOW
Hemoglobin: 9.3 — ABNORMAL LOW
MCHC: 31.9
MCHC: 32.2
MCHC: 32.4
MCHC: 32.7
MCHC: 33
MCHC: 32.3
MCHC: 32.8
MCV: 81.7
MCV: 82.1
MCV: 82.1
MCV: 82.2
MCV: 82.2
MCV: 81.9
Platelets: 258
Platelets: 269
Platelets: 277
Platelets: 284
Platelets: 285
Platelets: 300
Platelets: 300
Platelets: 334
RBC: 3.29 — ABNORMAL LOW
RBC: 3.38 — ABNORMAL LOW
RBC: 3.2 — ABNORMAL LOW
RBC: 3.39 — ABNORMAL LOW
RDW: 14.6
RDW: 14.6
RDW: 14.6
RDW: 14.6
RDW: 14.5
RDW: 14.6
RDW: 14.7
WBC: 13.9 — ABNORMAL HIGH
WBC: 14.6 — ABNORMAL HIGH
WBC: 15.9 — ABNORMAL HIGH
WBC: 11.6 — ABNORMAL HIGH
WBC: 11.7 — ABNORMAL HIGH
WBC: 12 — ABNORMAL HIGH
WBC: 12.4 — ABNORMAL HIGH

## 2011-02-10 LAB — TROPONIN I: Troponin I: <0.01

## 2011-02-10 LAB — BASIC METABOLIC PANEL
BUN: 11
BUN: 15
BUN: 20
CO2: 28
CO2: 28
Calcium: 8.6
Calcium: 9.1
Chloride: 109
Creatinine, Ser: 1.5
Creatinine, Ser: 1.58 — ABNORMAL HIGH
Creatinine, Ser: 1.22
GFR calc Af Amer: 59 — ABNORMAL LOW
GFR calc Af Amer: >60
GFR calc non Af Amer: 51 — ABNORMAL LOW
GFR calc non Af Amer: 52 — ABNORMAL LOW
GFR calc non Af Amer: >60
Glucose, Bld: 125 — ABNORMAL HIGH
Glucose, Bld: 135 — ABNORMAL HIGH
Potassium: 4.4
Potassium: 4.5
Sodium: 137
Sodium: 142

## 2011-02-10 LAB — PROTIME-INR
INR: 1
Prothrombin Time: 13.2

## 2011-02-10 LAB — CK TOTAL AND CKMB (NOT AT ARMC)
CK, MB: 1.7
CK, MB: 1.9
CK, MB: 2.1
Relative Index: 1.7
Relative Index: 1.8
Total CK: 109
Total CK: 114
Total CK: 115

## 2011-02-10 LAB — WOUND CULTURE

## 2011-02-10 LAB — COMPREHENSIVE METABOLIC PANEL
ALT: 26
ALT: 17
AST: 21
AST: 22
Albumin: 3.3 — ABNORMAL LOW
Alkaline Phosphatase: 87
Alkaline Phosphatase: 81
BUN: 22
CO2: 29
CO2: 29
Chloride: 102
Chloride: 105
Creatinine, Ser: 0.69
Creatinine, Ser: 0.7
GFR calc Af Amer: >60
GFR calc Af Amer: >60
GFR calc non Af Amer: >60
GFR calc non Af Amer: 50 — ABNORMAL LOW
Glucose, Bld: 100 — ABNORMAL HIGH
Potassium: 3.8
Potassium: 5.4 — ABNORMAL HIGH
Sodium: 141
Total Bilirubin: 0.6
Total Bilirubin: 0.7
Total Protein: 7.6

## 2011-02-10 LAB — LIPID PANEL
LDL Cholesterol: 99
Triglycerides: 116
VLDL: 23

## 2011-02-10 LAB — URINALYSIS, MICROSCOPIC ONLY
Nitrite: NEGATIVE
Specific Gravity, Urine: 1.023
Urobilinogen, UA: 0.2
pH: 8.5 — ABNORMAL HIGH

## 2011-02-10 LAB — URINE CULTURE
Colony Count: >=100000
Special Requests: NEGATIVE

## 2011-02-10 LAB — B-NATRIURETIC PEPTIDE (CONVERTED LAB): Pro B Natriuretic peptide (BNP): <30

## 2011-02-10 LAB — APTT: aPTT: 23 — ABNORMAL LOW

## 2011-02-10 LAB — PHENYTOIN LEVEL, TOTAL: Phenytoin Lvl: 3.9 — ABNORMAL LOW

## 2011-02-10 LAB — POCT I-STAT, CHEM 8
Calcium, Ion: 1.01 — ABNORMAL LOW
Hemoglobin: 12.2 — ABNORMAL LOW
Sodium: 138
TCO2: 25

## 2011-02-10 LAB — OCCULT BLOOD X 1 CARD TO LAB, STOOL: Fecal Occult Bld: POSITIVE

## 2011-02-10 LAB — CROSSMATCH
ABO/RH(D): B POS
Antibody Screen: NEGATIVE

## 2011-02-10 LAB — MAGNESIUM
Magnesium: 1.9
Magnesium: 2

## 2011-02-10 LAB — CULTURE, BLOOD (ROUTINE X 2): Culture: NO GROWTH

## 2011-02-14 LAB — URINE CULTURE

## 2011-02-14 LAB — BASIC METABOLIC PANEL
CO2: 25
GFR calc Af Amer: >60
GFR calc non Af Amer: >60
Glucose, Bld: 129 — ABNORMAL HIGH
Potassium: 4.1
Sodium: 136

## 2011-02-14 LAB — URINE MICROSCOPIC-ADD ON

## 2011-02-14 LAB — DIFFERENTIAL
Basophils Absolute: 0.1
Eosinophils Relative: 2
Lymphocytes Relative: 15
Monocytes Absolute: 1.1 — ABNORMAL HIGH
Monocytes Relative: 12

## 2011-02-14 LAB — CBC
HCT: 32.5 — ABNORMAL LOW
Hemoglobin: 10.4 — ABNORMAL LOW
RBC: 3.98 — ABNORMAL LOW
RDW: 16.1 — ABNORMAL HIGH

## 2011-02-14 LAB — URINALYSIS, ROUTINE W REFLEX MICROSCOPIC
Glucose, UA: NEGATIVE
Ketones, ur: NEGATIVE
Nitrite: NEGATIVE
Specific Gravity, Urine: 1.01
pH: 8.5 — ABNORMAL HIGH

## 2011-02-15 LAB — URINALYSIS, ROUTINE W REFLEX MICROSCOPIC
Bilirubin Urine: NEGATIVE
Glucose, UA: NEGATIVE
Specific Gravity, Urine: 1.017
Urobilinogen, UA: 1
pH: 6.5

## 2011-02-15 LAB — URINE MICROSCOPIC-ADD ON

## 2011-02-16 ENCOUNTER — Encounter (HOSPITAL_BASED_OUTPATIENT_CLINIC_OR_DEPARTMENT_OTHER): Payer: Medicare Other | Attending: Plastic Surgery

## 2011-02-16 DIAGNOSIS — L98499 Non-pressure chronic ulcer of skin of other sites with unspecified severity: Secondary | ICD-10-CM | POA: Insufficient documentation

## 2011-02-16 DIAGNOSIS — M2420 Disorder of ligament, unspecified site: Secondary | ICD-10-CM | POA: Insufficient documentation

## 2011-02-16 DIAGNOSIS — I10 Essential (primary) hypertension: Secondary | ICD-10-CM | POA: Insufficient documentation

## 2011-02-16 DIAGNOSIS — Z79899 Other long term (current) drug therapy: Secondary | ICD-10-CM | POA: Insufficient documentation

## 2011-02-16 DIAGNOSIS — G4730 Sleep apnea, unspecified: Secondary | ICD-10-CM | POA: Insufficient documentation

## 2011-02-16 DIAGNOSIS — M629 Disorder of muscle, unspecified: Secondary | ICD-10-CM | POA: Insufficient documentation

## 2011-02-16 DIAGNOSIS — G8250 Quadriplegia, unspecified: Secondary | ICD-10-CM | POA: Insufficient documentation

## 2011-02-16 DIAGNOSIS — IMO0002 Reserved for concepts with insufficient information to code with codable children: Secondary | ICD-10-CM | POA: Insufficient documentation

## 2011-02-16 DIAGNOSIS — G40802 Other epilepsy, not intractable, without status epilepticus: Secondary | ICD-10-CM | POA: Insufficient documentation

## 2011-02-16 DIAGNOSIS — K274 Chronic or unspecified peptic ulcer, site unspecified, with hemorrhage: Secondary | ICD-10-CM | POA: Insufficient documentation
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
## 2011-02-24 ENCOUNTER — Inpatient Hospital Stay (HOSPITAL_COMMUNITY)
Admission: EM | Admit: 2011-02-24 | Discharge: 2011-03-01 | DRG: 871 | Disposition: A | Discharge status: Home Health | Payer: Medicare Other | Attending: Internal Medicine | Admitting: Internal Medicine

## 2011-02-24 ENCOUNTER — Emergency Department (HOSPITAL_COMMUNITY): Payer: Medicare Other

## 2011-02-24 DIAGNOSIS — Z981 Arthrodesis status: Secondary | ICD-10-CM

## 2011-02-24 DIAGNOSIS — L8990 Pressure ulcer of unspecified site, unspecified stage: Secondary | ICD-10-CM | POA: Diagnosis present

## 2011-02-24 DIAGNOSIS — I509 Heart failure, unspecified: Secondary | ICD-10-CM | POA: Diagnosis present

## 2011-02-24 DIAGNOSIS — Z933 Colostomy status: Secondary | ICD-10-CM

## 2011-02-24 DIAGNOSIS — E662 Morbid (severe) obesity with alveolar hypoventilation: Secondary | ICD-10-CM | POA: Diagnosis present

## 2011-02-24 DIAGNOSIS — L89509 Pressure ulcer of unspecified ankle, unspecified stage: Secondary | ICD-10-CM | POA: Diagnosis present

## 2011-02-24 DIAGNOSIS — G8250 Quadriplegia, unspecified: Secondary | ICD-10-CM | POA: Diagnosis present

## 2011-02-24 DIAGNOSIS — I5032 Chronic diastolic (congestive) heart failure: Secondary | ICD-10-CM | POA: Diagnosis present

## 2011-02-24 DIAGNOSIS — J9610 Chronic respiratory failure, unspecified whether with hypoxia or hypercapnia: Secondary | ICD-10-CM | POA: Diagnosis present

## 2011-02-24 DIAGNOSIS — A419 Sepsis, unspecified organism: Principal | ICD-10-CM | POA: Diagnosis present

## 2011-02-24 DIAGNOSIS — Z87440 Personal history of urinary (tract) infections: Secondary | ICD-10-CM

## 2011-02-24 DIAGNOSIS — N179 Acute kidney failure, unspecified: Secondary | ICD-10-CM | POA: Diagnosis present

## 2011-02-24 DIAGNOSIS — D62 Acute posthemorrhagic anemia: Secondary | ICD-10-CM | POA: Diagnosis present

## 2011-02-24 DIAGNOSIS — M8618 Other acute osteomyelitis, other site: Secondary | ICD-10-CM | POA: Diagnosis present

## 2011-02-24 DIAGNOSIS — G4733 Obstructive sleep apnea (adult) (pediatric): Secondary | ICD-10-CM | POA: Diagnosis present

## 2011-02-24 DIAGNOSIS — Z96649 Presence of unspecified artificial hip joint: Secondary | ICD-10-CM

## 2011-02-24 DIAGNOSIS — L8994 Pressure ulcer of unspecified site, stage 4: Secondary | ICD-10-CM | POA: Diagnosis present

## 2011-02-24 DIAGNOSIS — L89109 Pressure ulcer of unspecified part of back, unspecified stage: Secondary | ICD-10-CM | POA: Diagnosis present

## 2011-02-24 DIAGNOSIS — I10 Essential (primary) hypertension: Secondary | ICD-10-CM | POA: Diagnosis present

## 2011-02-24 DIAGNOSIS — Z8614 Personal history of Methicillin resistant Staphylococcus aureus infection: Secondary | ICD-10-CM

## 2011-02-24 DIAGNOSIS — Z23 Encounter for immunization: Secondary | ICD-10-CM

## 2011-02-24 DIAGNOSIS — G40909 Epilepsy, unspecified, not intractable, without status epilepticus: Secondary | ICD-10-CM | POA: Diagnosis present

## 2011-02-24 DIAGNOSIS — IMO0002 Reserved for concepts with insufficient information to code with codable children: Secondary | ICD-10-CM

## 2011-02-24 LAB — DIFFERENTIAL
Basophils Absolute: 0 10*3/uL (ref 0.0–0.1)
Eosinophils Absolute: 0.2 10*3/uL (ref 0.0–0.7)
Eosinophils Relative: 2 % (ref 0–5)
Lymphocytes Relative: 12 % (ref 12–46)
Lymphs Abs: 1.6 10*3/uL (ref 0.7–4.0)
Neutrophils Relative %: 79 % — ABNORMAL HIGH (ref 43–77)

## 2011-02-24 LAB — POCT I-STAT, CHEM 8
Chloride: 109 mEq/L (ref 96–112)
Glucose, Bld: 145 mg/dL — ABNORMAL HIGH (ref 70–99)
HCT: 31 % — ABNORMAL LOW (ref 39.0–52.0)
Hemoglobin: 10.5 g/dL — ABNORMAL LOW (ref 13.0–17.0)
Potassium: 4.1 mEq/L (ref 3.5–5.1)

## 2011-02-24 LAB — CBC
Platelets: 398 10*3/uL (ref 150–400)
RBC: 3.76 MIL/uL — ABNORMAL LOW (ref 4.22–5.81)
RDW: 14.9 % (ref 11.5–15.5)
WBC: 13.6 10*3/uL — ABNORMAL HIGH (ref 4.0–10.5)

## 2011-02-25 ENCOUNTER — Emergency Department (HOSPITAL_COMMUNITY): Payer: Medicare Other

## 2011-02-25 DIAGNOSIS — I959 Hypotension, unspecified: Secondary | ICD-10-CM

## 2011-02-25 DIAGNOSIS — L8990 Pressure ulcer of unspecified site, unspecified stage: Secondary | ICD-10-CM

## 2011-02-25 DIAGNOSIS — A419 Sepsis, unspecified organism: Secondary | ICD-10-CM

## 2011-02-25 DIAGNOSIS — L89109 Pressure ulcer of unspecified part of back, unspecified stage: Secondary | ICD-10-CM

## 2011-02-25 LAB — DIFFERENTIAL
Basophils Absolute: 0 10*3/uL (ref 0.0–0.1)
Basophils Relative: 0 % (ref 0–1)
Eosinophils Absolute: 0.2 10*3/uL (ref 0.0–0.7)
Monocytes Relative: 10 % (ref 3–12)
Neutrophils Relative %: 77 % (ref 43–77)

## 2011-02-25 LAB — PROCALCITONIN: Procalcitonin: <0.1 ng/mL

## 2011-02-25 LAB — COMPREHENSIVE METABOLIC PANEL
ALT: 15 U/L (ref 0–53)
AST: 14 U/L (ref 0–37)
AST: 13 U/L (ref 0–37)
Albumin: 3.1 g/dL — ABNORMAL LOW (ref 3.5–5.2)
BUN: 27 mg/dL — ABNORMAL HIGH (ref 6–23)
CO2: 22 mEq/L (ref 19–32)
Calcium: 9.6 mg/dL (ref 8.4–10.5)
Calcium: 9.4 mg/dL (ref 8.4–10.5)
Creatinine, Ser: 0.48 mg/dL — ABNORMAL LOW (ref 0.50–1.35)
Potassium: 4 mEq/L (ref 3.5–5.1)
Sodium: 141 mEq/L (ref 135–145)
Total Protein: 8.3 g/dL (ref 6.0–8.3)
Total Protein: 7.7 g/dL (ref 6.0–8.3)

## 2011-02-25 LAB — PROTIME-INR
INR: 1.06 (ref 0.00–1.49)
Prothrombin Time: 14 seconds (ref 11.6–15.2)

## 2011-02-25 LAB — CBC
MCH: 25.1 pg — ABNORMAL LOW (ref 26.0–34.0)
MCHC: 31 g/dL (ref 30.0–36.0)
Platelets: 359 10*3/uL (ref 150–400)
RBC: 3.34 MIL/uL — ABNORMAL LOW (ref 4.22–5.81)

## 2011-02-25 LAB — LACTIC ACID, PLASMA: Lactic Acid, Venous: 1 mmol/L (ref 0.5–2.2)

## 2011-02-26 LAB — DIFFERENTIAL
Lymphs Abs: 1.5 10*3/uL (ref 0.7–4.0)
Monocytes Relative: 11 % (ref 3–12)
Neutro Abs: 4.6 10*3/uL (ref 1.7–7.7)
Neutrophils Relative %: 63 % (ref 43–77)

## 2011-02-26 LAB — BASIC METABOLIC PANEL
CO2: 25 mEq/L (ref 19–32)
Calcium: 8.9 mg/dL (ref 8.4–10.5)
Creatinine, Ser: <0.47 mg/dL — ABNORMAL LOW (ref 0.50–1.35)
Glucose, Bld: 110 mg/dL — ABNORMAL HIGH (ref 70–99)
Sodium: 140 mEq/L (ref 135–145)

## 2011-02-26 LAB — CBC
HCT: 26 % — ABNORMAL LOW (ref 39.0–52.0)
Hemoglobin: 8 g/dL — ABNORMAL LOW (ref 13.0–17.0)
MCH: 25.2 pg — ABNORMAL LOW (ref 26.0–34.0)
MCV: 81.8 fL (ref 78.0–100.0)
RBC: 3.18 MIL/uL — ABNORMAL LOW (ref 4.22–5.81)

## 2011-02-27 LAB — CBC
HCT: 26.2 % — ABNORMAL LOW (ref 39.0–52.0)
MCV: 82.1 fL (ref 78.0–100.0)
RDW: 15.2 % (ref 11.5–15.5)
WBC: 7.9 10*3/uL (ref 4.0–10.5)

## 2011-02-27 LAB — BASIC METABOLIC PANEL
BUN: 10 mg/dL (ref 6–23)
Chloride: 106 mEq/L (ref 96–112)
Creatinine, Ser: <0.47 mg/dL — ABNORMAL LOW (ref 0.50–1.35)

## 2011-02-28 LAB — BASIC METABOLIC PANEL
BUN: 8 mg/dL (ref 6–23)
CO2: 27 mEq/L (ref 19–32)
Chloride: 106 mEq/L (ref 96–112)
Creatinine, Ser: <0.47 mg/dL — ABNORMAL LOW (ref 0.50–1.35)

## 2011-03-01 LAB — CROSSMATCH
ABO/RH(D): B POS
Antibody Screen: NEGATIVE
Unit division: 0

## 2011-03-08 ENCOUNTER — Emergency Department (HOSPITAL_COMMUNITY)
Admission: EM | Admit: 2011-03-08 | Discharge: 2011-03-08 | Disposition: A | Discharge status: Home / Self Care | Payer: Medicare Other | Attending: Emergency Medicine | Admitting: Emergency Medicine

## 2011-03-08 DIAGNOSIS — G8250 Quadriplegia, unspecified: Secondary | ICD-10-CM | POA: Insufficient documentation

## 2011-03-08 DIAGNOSIS — G40909 Epilepsy, unspecified, not intractable, without status epilepticus: Secondary | ICD-10-CM | POA: Insufficient documentation

## 2011-03-08 DIAGNOSIS — Z79899 Other long term (current) drug therapy: Secondary | ICD-10-CM | POA: Insufficient documentation

## 2011-03-08 DIAGNOSIS — I10 Essential (primary) hypertension: Secondary | ICD-10-CM | POA: Insufficient documentation

## 2011-03-08 NOTE — Consult Note (Signed)
  [name] [name], VECCHIO NO.:  [id number]  MEDICAL RECORD NO.:  [id number]  LOCATION:  5013                         FACILITY:  [facility]  PHYSICIAN:  Currie Paris, M.D.DATE OF BIRTH:  15-Jul-1965  DATE OF CONSULTATION:  02/25/2011 DATE OF DISCHARGE:                                CONSULTATION   HISTORY OF PRESENT ILLNESS:  [name] [name] is a 45 year old quadriplegic who has had chronic ongoing sacral-ischial wound care by Dr. Wayland Denis. He got readmitted due to some overt bleeding and hypotension.  Dr. Kelly Splinter apparently was unavailable to assess his wound, which apparently had been bleeding and therefore as a courtesy we were asked to come look at the wound to see if any ligation of any bleeding was necessary.  PAST MEDICAL HISTORY:  As noted on the history and physical and has agreed with.  PHYSICAL EXAMINATION:  On brief exam of the sacral ischial wound area, it is quite sizable and chronic in nature.  There are large areas of proud flesh/or hypotrophic flaps that are certainly quite friable, but at the moment there is no active bleeding.  The wound ostomy nurse had placed a alginate dressing followed by petroleum-based gauze from a non- stick standpoint.  And again there was no active bleeding that needed any necessary ligation or cautery.  The wound was seen with me by Dr. Cyndia [name].  ASSESSMENT:  Intermittently bleeding chronic sacral decubitus wound likely secondary to proud flesh.  RECOMMENDATION:  Continue current dressing as indicated right now.  We will order silver nitrate sticks to the bedside as a precaution; otherwise, when primary managing physician from a wound standpoint returns, I might recommend excision of these flaps in a surgical setting.     Brayton El, PA-C   ______________________________ Currie Paris, M.D.    KB/MEDQ  D:  02/25/2011  T:  02/25/2011  Job:  [id number]  Electronically Signed by Brayton El   on 03/02/2011 03:51:53 PM Electronically Signed by [name] [name] M.D. on 03/08/2011 10:27:33 AM

## 2011-03-10 NOTE — Discharge Summary (Signed)
NAMEHAMDAN, TOSCANO NO.:  [id number]  MEDICAL RECORD NO.:  [id number]  LOCATION:  5013                         FACILITY:  [facility]  PHYSICIAN:  Osvaldo Shipper, MD     DATE OF BIRTH:  [birth year]  DATE OF ADMISSION:  02/24/2011 DATE OF DISCHARGE:  03/01/2011                              DISCHARGE SUMMARY   PRIMARY CARE PHYSICIAN:  Robyn N. Allyne Gee, MD.  PLASTIC SURGEON:  Wayland Denis, DO.  CONSULTATION DURING THIS HOSPITALIZATION:  General Surgery, Dr. Jamey Ripa.  IMAGING STUDIES DONE:  Include 1. X-ray of the pelvis which showed advanced chronic osseous changes     of the hip not possible to exclude a superimposed acute process. 2. Chest x-ray showed prominent cardiomediastinal contour similar to     prior.  PERTINENT LABS:  Include initial white cell count of 13.6 with 79% neutrophils, hemoglobin was 10.5 and dropped to the 8, but then remained stable.  BUN was elevated at 28 and then it also improved.  Creatinine was normal.  C-reactive protein was 7.95.  Fecal occult blood testing was negative.  DISCHARGE DIAGNOSIS: 1. 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 2. Quadriparesis. 3. Acute blood loss anemia stable. 4. Elevated blood pressures due to lack of diuretics. 5. Chronic diastolic dysfunction. 6. Morbid obesity.  BRIEF HOSPITAL COURSE:  Briefly, this is a 45 year old morbidly obese African American male who has a stage IV decubitus who presented to the hospital with complaints of pain and bleeding from the ulcers in his back.  The patient was initially admitted to the step-down unit and was prescribed intravenous antibiotics.  Bleeding was controlled with nitrate sticks.  It appears that the patient follows with plastic surgeon Dr. Kelly Splinter and according to the notes reviewed from Dr. Leonie Green clinic it was felt that the plan was for a surgical debridement in the near future.  So she was consulted,  however, the physician was going out of town, so [facility] was consulted, however, they felt that there was no immediate need for any surgical intervention.  The bleeding stopped spontaneously, subsequently and the infection continued to improve with IV antibiotics.  He did not require any blood transfusions during this hospitalization.  The plan is for IV vancomycin to be continued for at least 10 days.  We will have the patient follow up with Dr. Kelly Splinter within the next 1 week to decide further course of action regarding this wound.  Wound care nurse from our system was also involved in this patient's care.  Home health will be set up for wound care as well as the IV antibiotics.  Rest of his medical issues including seizure disorder, GERD remained stable.  He did have elevated blood pressures during the latter half of his admission which was due to the fact that the Lasix was held at the time of admission due to low blood pressures.  I have explained to the patient that once he resumed his Lasix the blood pressure should come back to his usual baseline.  On the day of discharge, the patient denies any complaints, is keen on going home.  PHYSICAL  EXAMINATION:  VITAL SIGNS:  His vital signs are all stable except for a  blood pressure which was 147/100.  His saturations are 99% on room air. LUNGS:  Clear to auscultation bilaterally with no wheezing, rales, or rhonchi. CARDIOVASCULAR:  S1 and S2 is normal, regular.  No S3-S4, rubs, murmurs, or bruits.  ABDOMEN:  Soft, nontender, nondistended.  Bowel sounds are present.  No masses or organomegaly is appreciated.  He does have quadriparesis.  ASSESSMENT/PLAN:  As per above.  DISCHARGE MEDICATIONS: 1. Levaquin 500 mg once daily for 10 days. 2. Vancomycin 1500 mg q.12 hours intravenously for 10 days. 3. Baclofen 20 mg every 6 hours orally. 4. Docusate 1 capsule orally twice daily. 5. Ferrous sulfate 1 tablet by mouth twice daily. 6.  Lasix 20 mg 1-2 tablets by mouth twice daily depending on his     edema. 7. Therapeutic multivitamins 1 tab daily. 8. Pepcid over the counter 1 tablet p.o. b.i.d. 9. Phenytek 200 mg daily at bedtime and 300 mg every morning. 10.Potassium chloride 20 mEq twice daily. 11.Reglan 10 mg every 8 hours by mouth. 12.Vitamin C 500 mg twice daily by mouth. 13.Zinc over the counter 1 tab p.o. daily p.o. b.i.d.  FOLLOWUP: 1. Follow up with Dr. Wayland Denis within a week. 2. Dr. Dorothyann Peng in 2-3 weeks.  Home health has been set up for IV antibiotics and for wound care.  DIET:  As before.  TOTAL TIME ON THIS DISCHARGE ENCOUNTER:  35 minutes.   Osvaldo Shipper, MD     GK/MEDQ  D:  03/01/2011  T:  03/01/2011  Job:  [id number]  cc:   Candyce Churn. Allyne Gee, M.D. Wayland Denis, DO  Electronically Signed by Osvaldo Shipper MD on 03/10/2011 06:48:12 AM
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
## 2011-03-11 NOTE — H&P (Signed)
  NAMETYGE, SOMERS NO.:  [id number]  MEDICAL RECORD NO.:  [id number]  LOCATION:  2603                         FACILITY:  [facility]  PHYSICIAN:  Pleas Koch, MD        DATE OF BIRTH:  [birth year]  DATE OF ADMISSION:  02/24/2011 DATE OF DISCHARGE:                             HISTORY & PHYSICAL   ADDENDUM:  The patient was seen and reviewed by myself subsequent to admission on Critical Care Floor and his blood pressure stabilized.  It was noted that Critical Care Medicine, Dr. Kendrick Fries placed a central line on him in order to give him IV fluids.  The patient's procalcitonin and lactate repeat were done morning subsequent to admission were low, which points to possible less likely overt SIRS and sepsis criteria and this could be more so interpreted as being autonomic dysfunction.  The patient will remain on step-down and will need to be reviewed once again by [facility] with regard to his specific anemic process and will be reviewed by [facility] on day-to-day basis.  Appreciate Critical Care Medicine's assistance once again in placing line for access.          ______________________________ Pleas Koch, MD     JS/MEDQ  D:  02/25/2011  T:  02/25/2011  Job:  [id number]  Electronically Signed by Pleas Koch MD on 03/11/2011 02:46:45 PM

## 2011-03-23 ENCOUNTER — Encounter (HOSPITAL_BASED_OUTPATIENT_CLINIC_OR_DEPARTMENT_OTHER): Payer: Medicare Other | Attending: Plastic Surgery

## 2011-03-23 DIAGNOSIS — L89109 Pressure ulcer of unspecified part of back, unspecified stage: Secondary | ICD-10-CM | POA: Insufficient documentation

## 2011-03-23 DIAGNOSIS — L89309 Pressure ulcer of unspecified buttock, unspecified stage: Secondary | ICD-10-CM | POA: Insufficient documentation

## 2011-03-23 DIAGNOSIS — L8993 Pressure ulcer of unspecified site, stage 3: Secondary | ICD-10-CM | POA: Insufficient documentation

## 2011-04-20 ENCOUNTER — Encounter (HOSPITAL_BASED_OUTPATIENT_CLINIC_OR_DEPARTMENT_OTHER): Payer: Medicare Other | Attending: Plastic Surgery

## 2011-04-20 DIAGNOSIS — L89109 Pressure ulcer of unspecified part of back, unspecified stage: Secondary | ICD-10-CM | POA: Insufficient documentation

## 2011-04-20 DIAGNOSIS — L8993 Pressure ulcer of unspecified site, stage 3: Secondary | ICD-10-CM | POA: Insufficient documentation

## 2011-04-20 DIAGNOSIS — L89309 Pressure ulcer of unspecified buttock, unspecified stage: Secondary | ICD-10-CM | POA: Insufficient documentation

## 2011-04-27 ENCOUNTER — Encounter (HOSPITAL_BASED_OUTPATIENT_CLINIC_OR_DEPARTMENT_OTHER): Payer: Medicare Other | Attending: Plastic Surgery

## 2011-04-27 DIAGNOSIS — L98499 Non-pressure chronic ulcer of skin of other sites with unspecified severity: Secondary | ICD-10-CM | POA: Insufficient documentation

## 2011-04-27 DIAGNOSIS — L97509 Non-pressure chronic ulcer of other part of unspecified foot with unspecified severity: Secondary | ICD-10-CM | POA: Insufficient documentation

## 2011-04-27 DIAGNOSIS — L97809 Non-pressure chronic ulcer of other part of unspecified lower leg with unspecified severity: Secondary | ICD-10-CM | POA: Insufficient documentation

## 2011-04-27 DIAGNOSIS — I10 Essential (primary) hypertension: Secondary | ICD-10-CM | POA: Insufficient documentation

## 2011-04-27 DIAGNOSIS — Z79899 Other long term (current) drug therapy: Secondary | ICD-10-CM | POA: Insufficient documentation

## 2011-04-27 DIAGNOSIS — G8250 Quadriplegia, unspecified: Secondary | ICD-10-CM | POA: Insufficient documentation

## 2011-04-28 ENCOUNTER — Encounter (HOSPITAL_BASED_OUTPATIENT_CLINIC_OR_DEPARTMENT_OTHER): Payer: Medicare Other
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
## 2011-05-02 ENCOUNTER — Encounter (HOSPITAL_COMMUNITY): Payer: Self-pay | Admitting: Emergency Medicine

## 2011-05-02 ENCOUNTER — Emergency Department (HOSPITAL_COMMUNITY)
Admission: EM | Admit: 2011-05-02 | Discharge: 2011-05-02 | Disposition: A | Discharge status: Home Health | Payer: Medicare Other | Attending: Emergency Medicine | Admitting: Emergency Medicine

## 2011-05-02 DIAGNOSIS — G8250 Quadriplegia, unspecified: Secondary | ICD-10-CM | POA: Insufficient documentation

## 2011-05-02 DIAGNOSIS — R42 Dizziness and giddiness: Secondary | ICD-10-CM | POA: Insufficient documentation

## 2011-05-02 DIAGNOSIS — Z933 Colostomy status: Secondary | ICD-10-CM | POA: Insufficient documentation

## 2011-05-02 DIAGNOSIS — N390 Urinary tract infection, site not specified: Secondary | ICD-10-CM

## 2011-05-02 DIAGNOSIS — I10 Essential (primary) hypertension: Secondary | ICD-10-CM | POA: Insufficient documentation

## 2011-05-02 LAB — URINALYSIS, ROUTINE W REFLEX MICROSCOPIC
Bilirubin Urine: NEGATIVE
Ketones, ur: NEGATIVE mg/dL
Nitrite: NEGATIVE
Protein, ur: 30 mg/dL — AB
Urobilinogen, UA: 0.2 mg/dL (ref 0.0–1.0)

## 2011-05-02 MED ORDER — SULFAMETHOXAZOLE-TMP DS 800-160 MG PO TABS
1.0000 | ORAL_TABLET | Freq: Once | ORAL | Status: AC
  Administered 2011-05-02: 1 via ORAL
  Filled 2011-05-02: qty 1

## 2011-05-02 MED ORDER — SULFAMETHOXAZOLE-TRIMETHOPRIM 800-160 MG PO TABS: 1.0000 | ORAL_TABLET | Freq: Two times a day (BID) | ORAL | Status: AC

## 2011-05-02 NOTE — ED Notes (Signed)
Pt transported home via [facility] to home

## 2011-05-02 NOTE — ED Notes (Signed)
24 fr. Cath replaced. Filled with 30 cc NS; pt tolerated well

## 2011-05-02 NOTE — ED Notes (Signed)
[id number]<ZD> Expected date:<BR> Expected time:<BR> Means of arrival:<BR> Comments:<BR> PTAR

## 2011-05-02 NOTE — ED Notes (Signed)
Pt transported home via PT[facility] 
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
## 2011-05-02 NOTE — ED Notes (Signed)
AS per EMS. Pt c/o dizziness while lying in bed. Pt has in dwelling foley and colostomy

## 2011-05-05 LAB — URINE CULTURE
Colony Count: 30000
Culture  Setup Time: 201212170933

## 2011-05-25 ENCOUNTER — Encounter (HOSPITAL_BASED_OUTPATIENT_CLINIC_OR_DEPARTMENT_OTHER): Payer: Medicare Other | Attending: Plastic Surgery

## 2011-05-25 DIAGNOSIS — G8250 Quadriplegia, unspecified: Secondary | ICD-10-CM | POA: Insufficient documentation

## 2011-05-25 DIAGNOSIS — L89899 Pressure ulcer of other site, unspecified stage: Secondary | ICD-10-CM | POA: Insufficient documentation

## 2011-05-25 DIAGNOSIS — L8990 Pressure ulcer of unspecified site, unspecified stage: Secondary | ICD-10-CM | POA: Insufficient documentation

## 2011-05-25 DIAGNOSIS — IMO0002 Reserved for concepts with insufficient information to code with codable children: Secondary | ICD-10-CM | POA: Insufficient documentation

## 2011-05-25 DIAGNOSIS — L89109 Pressure ulcer of unspecified part of back, unspecified stage: Secondary | ICD-10-CM | POA: Insufficient documentation

## 2011-05-25 DIAGNOSIS — Z79899 Other long term (current) drug therapy: Secondary | ICD-10-CM | POA: Insufficient documentation

## 2011-05-25 DIAGNOSIS — I10 Essential (primary) hypertension: Secondary | ICD-10-CM | POA: Insufficient documentation

## 2011-05-25 DIAGNOSIS — X58XXXS Exposure to other specified factors, sequela: Secondary | ICD-10-CM | POA: Insufficient documentation

## 2011-05-26 NOTE — Progress Notes (Signed)
[facility]  NAME:  [name] [name], [name] [name] NO.:  [id number]  MEDICAL RECORD NO.:  [id number]      DATE OF BIRTH:  16-Sep-1965  PHYSICIAN:  Wayland Denis, DO       VISIT DATE:  05/25/2011                                  OFFICE VISIT   [name] [name] is a 46 year old gentleman who is here for followup on his sacral and bilateral lower extremity ulcers.  He is in very good mood today.  His wounds are looking a little bit better.  He has been using silver alginate on the sacral area and Santyl on the lower extremities.  His social situation is unchanged.  His medications are reviewed and unchanged.  REVIEW OF SYSTEMS:  Negative, if not otherwise stated.  PHYSICAL EXAMINATION:  GENERAL:  He is alert, oriented, cooperative, not in any acute distress.  He is very pleasant. HEENT:  His pupils are equal.  Extraocular muscles are intact. NECK:  No cervical lymphadenopathy. CHEST:  His breathing is unlabored. HEART:  Regular. ABDOMEN:  Soft.  The sacral wound is not bleeding and is looking better today.  Recommend continuing with the silver alginate and Santyl on the lower extremities.  Offloading multivitamin, zinc and protein, and follow up in several weeks.     Wayland Denis, DO     CS/MEDQ  D:  05/25/2011  T:  05/26/2011  Job:  [id number]

## 2011-06-29 ENCOUNTER — Encounter (HOSPITAL_BASED_OUTPATIENT_CLINIC_OR_DEPARTMENT_OTHER): Payer: Medicare Other | Attending: Plastic Surgery

## 2011-06-29 DIAGNOSIS — L89899 Pressure ulcer of other site, unspecified stage: Secondary | ICD-10-CM | POA: Insufficient documentation

## 2011-06-29 DIAGNOSIS — L89109 Pressure ulcer of unspecified part of back, unspecified stage: Secondary | ICD-10-CM | POA: Insufficient documentation

## 2011-06-29 DIAGNOSIS — Z79899 Other long term (current) drug therapy: Secondary | ICD-10-CM | POA: Insufficient documentation

## 2011-06-29 DIAGNOSIS — I10 Essential (primary) hypertension: Secondary | ICD-10-CM | POA: Insufficient documentation

## 2011-06-29 DIAGNOSIS — G8250 Quadriplegia, unspecified: Secondary | ICD-10-CM | POA: Insufficient documentation

## 2011-06-29 DIAGNOSIS — L8990 Pressure ulcer of unspecified site, unspecified stage: Secondary | ICD-10-CM | POA: Insufficient documentation

## 2011-06-29 DIAGNOSIS — X58XXXS Exposure to other specified factors, sequela: Secondary | ICD-10-CM | POA: Insufficient documentation

## 2011-06-29 DIAGNOSIS — IMO0002 Reserved for concepts with insufficient information to code with codable children: Secondary | ICD-10-CM | POA: Insufficient documentation

## 2011-07-01 ENCOUNTER — Emergency Department (HOSPITAL_COMMUNITY)
Admission: EM | Admit: 2011-07-01 | Discharge: 2011-07-02 | Disposition: A | Discharge status: Home / Self Care | Payer: Medicare Other | Attending: Emergency Medicine | Admitting: Emergency Medicine

## 2011-07-01 ENCOUNTER — Encounter (HOSPITAL_COMMUNITY): Payer: Self-pay | Admitting: Emergency Medicine

## 2011-07-01 ENCOUNTER — Emergency Department (HOSPITAL_COMMUNITY): Payer: Medicare Other

## 2011-07-01 DIAGNOSIS — J3489 Other specified disorders of nose and nasal sinuses: Secondary | ICD-10-CM | POA: Insufficient documentation

## 2011-07-01 DIAGNOSIS — J9610 Chronic respiratory failure, unspecified whether with hypoxia or hypercapnia: Secondary | ICD-10-CM | POA: Insufficient documentation

## 2011-07-01 DIAGNOSIS — G8250 Quadriplegia, unspecified: Secondary | ICD-10-CM | POA: Insufficient documentation

## 2011-07-01 DIAGNOSIS — R059 Cough, unspecified: Secondary | ICD-10-CM | POA: Insufficient documentation

## 2011-07-01 DIAGNOSIS — R000 Tachycardia, unspecified: Secondary | ICD-10-CM | POA: Insufficient documentation

## 2011-07-01 DIAGNOSIS — R05 Cough: Secondary | ICD-10-CM | POA: Insufficient documentation

## 2011-07-01 DIAGNOSIS — Z8701 Personal history of pneumonia (recurrent): Secondary | ICD-10-CM | POA: Insufficient documentation

## 2011-07-01 DIAGNOSIS — D649 Anemia, unspecified: Secondary | ICD-10-CM | POA: Insufficient documentation

## 2011-07-01 DIAGNOSIS — I10 Essential (primary) hypertension: Secondary | ICD-10-CM | POA: Insufficient documentation

## 2011-07-01 DIAGNOSIS — Z87828 Personal history of other (healed) physical injury and trauma: Secondary | ICD-10-CM | POA: Insufficient documentation

## 2011-07-01 DIAGNOSIS — R110 Nausea: Secondary | ICD-10-CM | POA: Insufficient documentation

## 2011-07-01 DIAGNOSIS — N390 Urinary tract infection, site not specified: Secondary | ICD-10-CM

## 2011-07-01 LAB — URINALYSIS, ROUTINE W REFLEX MICROSCOPIC
Glucose, UA: NEGATIVE mg/dL
Specific Gravity, Urine: 1.023 (ref 1.005–1.030)
Urobilinogen, UA: 0.2 mg/dL (ref 0.0–1.0)

## 2011-07-01 MED ORDER — SULFAMETHOXAZOLE-TMP DS 800-160 MG PO TABS
1.0000 | ORAL_TABLET | Freq: Once | ORAL | Status: AC
  Administered 2011-07-01: 1 via ORAL
  Filled 2011-07-01: qty 1

## 2011-07-01 MED ORDER — SULFAMETHOXAZOLE-TRIMETHOPRIM 800-160 MG PO TABS: 1.0000 | ORAL_TABLET | Freq: Two times a day (BID) | ORAL | Status: AC

## 2011-07-01 NOTE — ED Notes (Signed)
Pt states he "spiked" a temperature at 100.4. Pt reports of having nausea all day. "nausea went a way around 5:00 pm." pt denies any nausea right now.

## 2011-07-01 NOTE — ED Notes (Signed)
Clamp off pt foley to obtain urine for U/A

## 2011-07-01 NOTE — ED Notes (Signed)
Check on clamp 2050 pt in xray, recheck at 2100 no urine collected

## 2011-07-01 NOTE — Discharge Instructions (Signed)
Urinary Tract Infection Infections of the urinary tract can start in several places. A bladder infection (cystitis), a kidney infection (pyelonephritis), and a prostate infection (prostatitis) are different types of urinary tract infections (UTIs). They usually get better if treated with medicines (antibiotics) that kill germs. Take all the medicine until it is gone. You or your child may feel better in a few days, but TAKE ALL MEDICINE or the infection may not respond and may become more difficult to treat. HOME CARE INSTRUCTIONS   Drink enough water and fluids to keep the urine clear or pale yellow. Cranberry juice is especially recommended, in addition to large amounts of water.   Avoid caffeine, tea, and carbonated beverages. They tend to irritate the bladder.   Alcohol may irritate the prostate.   Only take over-the-counter or prescription medicines for pain, discomfort, or fever as directed by your caregiver.  To prevent further infections:  Empty the bladder often. Avoid holding urine for long periods of time.   After a bowel movement, women should cleanse from front to back. Use each tissue only once.   Empty the bladder before and after sexual intercourse.  FINDING OUT THE RESULTS OF YOUR TEST Not all test results are available during your visit. If your or your child's test results are not back during the visit, make an appointment with your caregiver to find out the results. Do not assume everything is normal if you have not heard from your caregiver or the medical facility. It is important for you to follow up on all test results. SEEK MEDICAL CARE IF:   There is back pain.   Your baby is older than 3 months with a rectal temperature of 100.5 F (38.1 C) or higher for more than 1 day.   Your or your child's problems (symptoms) are no better in 3 days. Return sooner if you or your child is getting worse.  SEEK IMMEDIATE MEDICAL CARE IF:   There is severe back pain or lower  abdominal pain.   You or your child develops chills.   You have a fever.   Your baby is older than 3 months with a rectal temperature of 102 F (38.9 C) or higher.   Your baby is 70 months old or younger with a rectal temperature of 100.4 F (38 C) or higher.   There is nausea or vomiting.   There is continued burning or discomfort with urination.  MAKE SURE YOU:   Understand these instructions.   Will watch your condition.   Will get help right away if you are not doing well or get worse.  Document Released: 02/09/2005 Document Revised: 01/12/2011 Document Reviewed: 09/14/2006 [facility] Patient Information 2012 [place], [place]. Take Omnicef as directed until completed.  Please make, Dr. Allyne Gee for followup

## 2011-07-01 NOTE — ED Notes (Signed)
[id number]<AV> Expected date:<BR> Expected time: 7:45 PM<BR> Means of arrival:<BR> Comments:<BR> [id number] -[id number] Nausea, fever.  Pt is quad

## 2011-07-01 NOTE — ED Notes (Signed)
Pt states his rectum has been "sowed up."
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
## 2011-07-01 NOTE — ED Notes (Signed)
Pt brought to the ER by EMS, pt c/o nausea, denies votimitng at this time, states has a fever since 1630, pt also report some issues with his foley, had to be replaced x3 in the past week, states he could have a UTI as well. Pt is afraid that he might get septic secondary to UTI, pt is quadriplegic.

## 2011-07-02 NOTE — ED Provider Notes (Signed)
Medical screening examination/treatment/procedure(s) were performed by non-physician practitioner and as supervising physician I was immediately available for consultation/collaboration.    Celene Kras, MD 07/02/11 [phone]

## 2011-07-05 LAB — URINE CULTURE
Colony Count: >=100000
Culture  Setup Time: 201302160647

## 2011-07-06 NOTE — ED Notes (Signed)
+   Urine Patient treated with Bactrim-sensitive to same-chart appended per protocol MD. 

## 2011-07-18 ENCOUNTER — Emergency Department (HOSPITAL_COMMUNITY): Payer: Medicare Other

## 2011-07-18 ENCOUNTER — Encounter (HOSPITAL_COMMUNITY): Payer: Self-pay | Admitting: Emergency Medicine

## 2011-07-18 ENCOUNTER — Inpatient Hospital Stay (HOSPITAL_COMMUNITY)
Admission: EM | Admit: 2011-07-18 | Discharge: 2011-07-23 | DRG: 698 | Disposition: A | Discharge status: Home Health | Payer: Medicare Other | Attending: Internal Medicine | Admitting: Internal Medicine

## 2011-07-18 DIAGNOSIS — G4733 Obstructive sleep apnea (adult) (pediatric): Secondary | ICD-10-CM | POA: Diagnosis present

## 2011-07-18 DIAGNOSIS — A0472 Enterocolitis due to Clostridium difficile, not specified as recurrent: Secondary | ICD-10-CM | POA: Diagnosis not present

## 2011-07-18 DIAGNOSIS — IMO0002 Reserved for concepts with insufficient information to code with codable children: Secondary | ICD-10-CM

## 2011-07-18 DIAGNOSIS — Z6833 Body mass index (BMI) 33.0-33.9, adult: Secondary | ICD-10-CM

## 2011-07-18 DIAGNOSIS — L8994 Pressure ulcer of unspecified site, stage 4: Secondary | ICD-10-CM | POA: Diagnosis present

## 2011-07-18 DIAGNOSIS — D6489 Other specified anemias: Secondary | ICD-10-CM | POA: Diagnosis present

## 2011-07-18 DIAGNOSIS — R509 Fever, unspecified: Secondary | ICD-10-CM

## 2011-07-18 DIAGNOSIS — L89109 Pressure ulcer of unspecified part of back, unspecified stage: Secondary | ICD-10-CM | POA: Diagnosis present

## 2011-07-18 DIAGNOSIS — Z933 Colostomy status: Secondary | ICD-10-CM

## 2011-07-18 DIAGNOSIS — L089 Local infection of the skin and subcutaneous tissue, unspecified: Secondary | ICD-10-CM | POA: Diagnosis present

## 2011-07-18 DIAGNOSIS — N390 Urinary tract infection, site not specified: Secondary | ICD-10-CM | POA: Diagnosis present

## 2011-07-18 DIAGNOSIS — R197 Diarrhea, unspecified: Secondary | ICD-10-CM | POA: Diagnosis present

## 2011-07-18 DIAGNOSIS — J189 Pneumonia, unspecified organism: Secondary | ICD-10-CM | POA: Diagnosis present

## 2011-07-18 DIAGNOSIS — D649 Anemia, unspecified: Secondary | ICD-10-CM | POA: Diagnosis present

## 2011-07-18 DIAGNOSIS — B965 Pseudomonas (aeruginosa) (mallei) (pseudomallei) as the cause of diseases classified elsewhere: Secondary | ICD-10-CM | POA: Diagnosis present

## 2011-07-18 DIAGNOSIS — I10 Essential (primary) hypertension: Secondary | ICD-10-CM | POA: Diagnosis present

## 2011-07-18 DIAGNOSIS — A498 Other bacterial infections of unspecified site: Secondary | ICD-10-CM | POA: Diagnosis present

## 2011-07-18 DIAGNOSIS — T83511A Infection and inflammatory reaction due to indwelling urethral catheter, initial encounter: Principal | ICD-10-CM | POA: Diagnosis present

## 2011-07-18 DIAGNOSIS — Y846 Urinary catheterization as the cause of abnormal reaction of the patient, or of later complication, without mention of misadventure at the time of the procedure: Secondary | ICD-10-CM | POA: Diagnosis present

## 2011-07-18 DIAGNOSIS — G8250 Quadriplegia, unspecified: Secondary | ICD-10-CM | POA: Diagnosis present

## 2011-07-18 DIAGNOSIS — E669 Obesity, unspecified: Secondary | ICD-10-CM | POA: Diagnosis present

## 2011-07-18 LAB — CBC
HCT: 29.1 % — ABNORMAL LOW (ref 39.0–52.0)
MCHC: 30.6 g/dL (ref 30.0–36.0)
MCV: 78.2 fL (ref 78.0–100.0)
RDW: 16.1 % — ABNORMAL HIGH (ref 11.5–15.5)
WBC: 8.2 10*3/uL (ref 4.0–10.5)

## 2011-07-18 LAB — URINALYSIS, MICROSCOPIC ONLY
Bilirubin Urine: NEGATIVE
Ketones, ur: NEGATIVE mg/dL
Nitrite: POSITIVE — AB
pH: 5.5 (ref 5.0–8.0)

## 2011-07-18 LAB — BASIC METABOLIC PANEL
BUN: 23 mg/dL (ref 6–23)
Chloride: 98 mEq/L (ref 96–112)
Creatinine, Ser: 0.54 mg/dL (ref 0.50–1.35)
GFR calc Af Amer: >90 mL/min (ref >90)

## 2011-07-18 MED ORDER — ACETAMINOPHEN 325 MG PO TABS
650.0000 mg | ORAL_TABLET | Freq: Once | ORAL | Status: AC
  Administered 2011-07-18: 650 mg via ORAL
  Filled 2011-07-18: qty 2

## 2011-07-18 MED ORDER — VANCOMYCIN HCL IN DEXTROSE 1-5 GM/200ML-% IV SOLN
1000.0000 mg | Freq: Once | INTRAVENOUS | Status: AC
  Administered 2011-07-19: 1000 mg via INTRAVENOUS
  Filled 2011-07-18: qty 200

## 2011-07-18 MED ORDER — ONDANSETRON HCL 4 MG/2ML IJ SOLN
4.0000 mg | Freq: Once | INTRAMUSCULAR | Status: AC
  Administered 2011-07-18: 4 mg via INTRAVENOUS
  Filled 2011-07-18: qty 2

## 2011-07-18 MED ORDER — SODIUM CHLORIDE 0.9 % IV BOLUS (SEPSIS)
1000.0000 mL | Freq: Once | INTRAVENOUS | Status: AC
  Administered 2011-07-18: 1000 mL via INTRAVENOUS

## 2011-07-18 MED ORDER — PIPERACILLIN-TAZOBACTAM 3.375 G IVPB
3.3750 g | Freq: Once | INTRAVENOUS | Status: AC
  Administered 2011-07-19: 3.375 g via INTRAVENOUS
  Filled 2011-07-18: qty 50

## 2011-07-18 NOTE — ED Notes (Signed)
Per Dr. Patria Mane. Start abx threapy prior to [facility] draw
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
## 2011-07-18 NOTE — ED Notes (Signed)
UNABLE TO OBTAIN IV ACCESS.  IV TEAM CALLED.  PT REPORTS HE USUALLY HAS A PICC LINE.

## 2011-07-18 NOTE — ED Notes (Signed)
ZOX:[id number]<AV> Expected date:07/18/11<BR> Expected time: 5:34 PM<BR> Means of arrival:Ambulance<BR> Comments:<BR> M61. 46 yo m. Sick. Ca pt. 5

## 2011-07-18 NOTE — ED Notes (Signed)
Pt. Had breakfast this AM, then started feeling nauseated with diarrhea and fever.  Pt. Is an incomplete quad.

## 2011-07-19 ENCOUNTER — Encounter (HOSPITAL_COMMUNITY): Payer: Self-pay | Admitting: Internal Medicine

## 2011-07-19 DIAGNOSIS — N390 Urinary tract infection, site not specified: Secondary | ICD-10-CM | POA: Diagnosis present

## 2011-07-19 DIAGNOSIS — L089 Local infection of the skin and subcutaneous tissue, unspecified: Secondary | ICD-10-CM | POA: Diagnosis present

## 2011-07-19 DIAGNOSIS — E669 Obesity, unspecified: Secondary | ICD-10-CM | POA: Diagnosis present

## 2011-07-19 LAB — CBC
Hemoglobin: 8.3 g/dL — ABNORMAL LOW (ref 13.0–17.0)
MCH: 23.9 pg — ABNORMAL LOW (ref 26.0–34.0)
MCV: 78.7 fL (ref 78.0–100.0)
Platelets: 374 10*3/uL (ref 150–400)
RBC: 3.47 MIL/uL — ABNORMAL LOW (ref 4.22–5.81)
WBC: 8.8 10*3/uL (ref 4.0–10.5)

## 2011-07-19 LAB — BASIC METABOLIC PANEL
CO2: 23 mEq/L (ref 19–32)
Calcium: 8.6 mg/dL (ref 8.4–10.5)
Chloride: 101 mEq/L (ref 96–112)
Glucose, Bld: 129 mg/dL — ABNORMAL HIGH (ref 70–99)
Potassium: 3.8 mEq/L (ref 3.5–5.1)
Sodium: 134 mEq/L — ABNORMAL LOW (ref 135–145)

## 2011-07-19 MED ORDER — VANCOMYCIN HCL IN DEXTROSE 1-5 GM/200ML-% IV SOLN
1000.0000 mg | Freq: Two times a day (BID) | INTRAVENOUS | Status: DC
  Administered 2011-07-19 – 2011-07-20 (×3): 1000 mg via INTRAVENOUS
  Filled 2011-07-19 (×5): qty 200

## 2011-07-19 MED ORDER — SODIUM CHLORIDE 0.9 % IV SOLN: 250.0000 mL | INTRAVENOUS | Status: DC | PRN

## 2011-07-19 MED ORDER — VITAMIN C 500 MG PO TABS
500.0000 mg | ORAL_TABLET | Freq: Every day | ORAL | Status: DC
  Administered 2011-07-19 – 2011-07-23 (×5): 500 mg via ORAL
  Filled 2011-07-19 (×6): qty 1

## 2011-07-19 MED ORDER — ZINC GLUCONATE 20 MG PO TABS: 1.0000 | ORAL_TABLET | Freq: Every morning | ORAL | Status: DC

## 2011-07-19 MED ORDER — JUVEN PO PACK
1.0000 | PACK | Freq: Two times a day (BID) | ORAL | Status: DC
  Administered 2011-07-19 – 2011-07-23 (×9): 1 via ORAL
  Filled 2011-07-19 (×13): qty 1

## 2011-07-19 MED ORDER — ENOXAPARIN SODIUM 60 MG/0.6ML ~~LOC~~ SOLN
60.0000 mg | SUBCUTANEOUS | Status: DC
  Administered 2011-07-19 – 2011-07-23 (×5): 60 mg via SUBCUTANEOUS
  Filled 2011-07-19 (×6): qty 0.6

## 2011-07-19 MED ORDER — DOCUSATE SODIUM 100 MG PO CAPS
100.0000 mg | ORAL_CAPSULE | Freq: Two times a day (BID) | ORAL | Status: DC
  Administered 2011-07-19 – 2011-07-23 (×9): 100 mg via ORAL
  Filled 2011-07-19 (×12): qty 1

## 2011-07-19 MED ORDER — PIPERACILLIN-TAZOBACTAM 3.375 G IVPB
3.3750 g | Freq: Three times a day (TID) | INTRAVENOUS | Status: DC
  Administered 2011-07-19 – 2011-07-22 (×9): 3.375 g via INTRAVENOUS
  Filled 2011-07-19 (×13): qty 50

## 2011-07-19 MED ORDER — SODIUM CHLORIDE 0.9 % IJ SOLN: 3.0000 mL | INTRAMUSCULAR | Status: DC | PRN

## 2011-07-19 MED ORDER — METOCLOPRAMIDE HCL 10 MG PO TABS
10.0000 mg | ORAL_TABLET | Freq: Three times a day (TID) | ORAL | Status: DC
  Administered 2011-07-19 – 2011-07-23 (×13): 10 mg via ORAL
  Filled 2011-07-19 (×18): qty 1

## 2011-07-19 MED ORDER — FUROSEMIDE 20 MG PO TABS
20.0000 mg | ORAL_TABLET | Freq: Two times a day (BID) | ORAL | Status: DC
  Administered 2011-07-19 – 2011-07-23 (×10): 20 mg via ORAL
  Filled 2011-07-19 (×15): qty 1

## 2011-07-19 MED ORDER — BACLOFEN 20 MG PO TABS
20.0000 mg | ORAL_TABLET | Freq: Four times a day (QID) | ORAL | Status: DC
  Administered 2011-07-19 – 2011-07-23 (×17): 20 mg via ORAL
  Filled 2011-07-19 (×26): qty 1

## 2011-07-19 MED ORDER — ENOXAPARIN SODIUM 40 MG/0.4ML ~~LOC~~ SOLN
40.0000 mg | SUBCUTANEOUS | Status: DC
  Filled 2011-07-19: qty 0.4

## 2011-07-19 MED ORDER — SODIUM CHLORIDE 0.9 % IJ SOLN
3.0000 mL | Freq: Two times a day (BID) | INTRAMUSCULAR | Status: DC
  Administered 2011-07-19 – 2011-07-23 (×6): 3 mL via INTRAVENOUS

## 2011-07-19 MED ORDER — SODIUM CHLORIDE 0.9 % IJ SOLN: 10.0000 mL | INTRAMUSCULAR | Status: DC | PRN

## 2011-07-19 MED ORDER — ALBUTEROL SULFATE HFA 108 (90 BASE) MCG/ACT IN AERS: 2.0000 | INHALATION_SPRAY | Freq: Four times a day (QID) | RESPIRATORY_TRACT | Status: DC | PRN

## 2011-07-19 MED ORDER — POTASSIUM CHLORIDE CRYS ER 20 MEQ PO TBCR
20.0000 meq | EXTENDED_RELEASE_TABLET | Freq: Two times a day (BID) | ORAL | Status: DC
  Administered 2011-07-19 – 2011-07-23 (×10): 20 meq via ORAL
  Filled 2011-07-19 (×13): qty 1

## 2011-07-19 MED ORDER — FERROUS SULFATE 325 (65 FE) MG PO TABS
325.0000 mg | ORAL_TABLET | Freq: Two times a day (BID) | ORAL | Status: DC
  Administered 2011-07-19 – 2011-07-23 (×10): 325 mg via ORAL
  Filled 2011-07-19 (×15): qty 1

## 2011-07-19 MED ORDER — ZINC SULFATE 220 (50 ZN) MG PO CAPS
220.0000 mg | ORAL_CAPSULE | Freq: Every day | ORAL | Status: DC
  Administered 2011-07-19 – 2011-07-23 (×5): 220 mg via ORAL
  Filled 2011-07-19 (×6): qty 1

## 2011-07-19 MED ORDER — HYDROMORPHONE HCL PF 1 MG/ML IJ SOLN: 1.0000 mg | INTRAMUSCULAR | Status: DC | PRN

## 2011-07-19 MED ORDER — FAMOTIDINE 20 MG PO TABS
20.0000 mg | ORAL_TABLET | Freq: Two times a day (BID) | ORAL | Status: DC
  Administered 2011-07-19 – 2011-07-23 (×10): 20 mg via ORAL
  Filled 2011-07-19 (×14): qty 1

## 2011-07-19 NOTE — Progress Notes (Signed)
Patient transferred to room 1315. Report was called to receiving nurse.
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
## 2011-07-19 NOTE — Progress Notes (Signed)
Patient transferred from 5 west, aler and oriented, on a bariatric bed, patient is a total care, denies pain, placed comfortably in bed

## 2011-07-19 NOTE — Consult Note (Signed)
WOC consult Note Reason for Consult:Multiple wounds, pressure ulcers, POA, chronic Wound type:pressure ulcers Pressure Ulcer POA: Yes Measurement:left hip:       8x3x .4cm             Right hip:    3x 1 x .2cm   Sacral:       2x 4x .4cm   Left IT:  8cm x 3cm x .4cm   Right IT:  8x7x.2cm   Left knee:  5x6x.4cm (90%pink)   Right lateral LE:  3x1.5x.4cm (80% pink) Wound bed:All areas except for the left knee and right lateral LE are clean, pink, granulating and friable. Some bleeding noted with cleansing. Drainage (amount, consistency, odor) Light yellow, malodorous exudate, small amount, noted on old dressings. Periwound:With evidence of previous scarring, healing. Dressing procedure/placement/frequency:Normal saline twice daily dressings are implemented. Left LQ Colostomy (Diverting) Stoma type/location: LLQ Stomal assessment/size: healthy, round, moist stoma, approximately 1 abnd 5/8 inches.  (Observed through pouch.) Peristomal assessment: not observed Treatment options for stomal/peristomal skin: None noted.Output  Ostomy pouching: 2pc., 2 and 3/4 inches.  Lawson # (Pouch): [phone], Skin barrier: #2. Orders provided for staff. I will not follow, but will remain available as needed for this patient and his medical team..  Please re-consult if needed. Thanks, Ladona Mow, MSN, RN, [facility], CWOCN [phone])

## 2011-07-19 NOTE — Progress Notes (Signed)
UR REVIEW COMPLETED; BCHANDLER RN, BSN, MHA 
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
## 2011-07-19 NOTE — Progress Notes (Signed)
Subjective: Patient relates feeling better. No nausea. Relates diarrhea couple of days.  Hah had wound for over 1 year.  Objective: Filed Vitals:   07/18/11 2232 07/19/11 0030 07/19/11 0300 07/19/11 0556  BP: 95/59 116/56  96/63  Pulse: 104 111  102  Temp: 100.9 F (38.3 C) 100.2 F (37.9 C)  99.1 F (37.3 C)  TempSrc: Oral Oral  Oral  Resp: 20 20  19   Height:   6' (1.829 m)   Weight:   113.399 kg (250 lb)   SpO2: 95% 93%  96%   Weight change:   Intake/Output Summary (Last 24 hours) at 07/19/11 1118 Last data filed at 07/19/11 1000  Gross per 24 hour  Intake    480 ml  Output   1975 ml  Net  -1495 ml    General: Alert, awake, oriented x3, in no acute distress.  HEENT: No bruits, no goiter.  Heart: Regular rate and rhythm, without murmurs, rubs, gallops.  Lungs: Crackles bl, bilateral air movement.  Abdomen: Soft, nontender, nondistended, positive bowel sounds.  Extremities.; left knee with dressing, ulceration no drainage, right ankle with dressing. Skin: decubitus ulcer stage 4, no significant drainage.    Lab Results:  [facility] 07/19/11 0625 07/18/11 2220  NA 134* 133*  K 3.8 4.6  CL 101 98  CO2 23 25  GLUCOSE 129* 99  BUN 20 23  CREATININE 0.61 0.54  CALCIUM 8.6 9.1  MG -- --  PHOS -- --    Basename 07/19/11 0625 07/18/11 2220  WBC 8.8 8.2  NEUTROABS -- --  HGB 8.3* 8.9*  HCT 27.3* 29.1*  MCV 78.7 78.2  PLT 374 430*    Micro Results: No results found for this or any previous visit (from the past 240 hour(s)).  Studies/Results: Dg Chest 2 View  07/18/2011  *RADIOLOGY REPORT*  Clinical Data: Fever.  Quadriplegia.  CHEST - 2 VIEW  Comparison: 07/01/2011  Findings: Lung bases are partially obscured by the patient's protruding abdominal tissues.  Left pleural thickening may be from pleural adipose tissue or pleural effusion.  Bony irregularity along the inferior tip of right scapula is again noted.  Cardiomegaly is present.  Old left rib deformities are  noted. Pleural thickening noted at the right lung apex.  Motion artifact and body habitus obscuring the lungs on the lateral projection.  There may be some faint left lower lobe airspace opacity.  IMPRESSION:  1.  Equivocal left lower lobe airspace opacity, although the left hemidiaphragm remains well seen. This could represent atelectasis or pneumonia. 2.  Bony deformities include stable left rib deformities and stable right inferior scapular deformity. 3.  The lung bases are partially obscured by the patient's protuberant abdominal tissues. 4.  Mild cardiomegaly. 5.  Pleural thickening at the right lung apex and along the left chest, possibly some pleural adipose tissue or pleural fluid. 5.  Body habitus and motion artifact reduce diagnostic sensitivity and specificity.  Original Report Authenticated By: Dellia Cloud, M.D.    Medications: I have reviewed the patient's current medications.   Patient Active Hospital Problem List:  UTI (lower urinary tract infection) (07/19/2011) Prior urine culture, e coli and pseudomonas. Continue with Zosyn, follow culture results.   Decubitus ulcer of buttock, stage 4 (07/19/2011) Wound care consulted. IV antibiotics.   Chest x ray infiltrates , vs atelectasis: Continue with Vancomycin and zosyn.   Diarrhea: will check for C diff. He was on antibiotics 3 weeks ago.    LOS: 1 day  Shamecca Whitebread M.D.  [facility] 07/19/2011, 11:18 AM
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
## 2011-07-20 LAB — URINE CULTURE
Colony Count: >=100000
Culture  Setup Time: 201303050135

## 2011-07-20 LAB — CBC
HCT: 24.9 % — ABNORMAL LOW (ref 39.0–52.0)
Hemoglobin: 7.5 g/dL — ABNORMAL LOW (ref 13.0–17.0)
MCV: 79 fL (ref 78.0–100.0)
RBC: 3.15 MIL/uL — ABNORMAL LOW (ref 4.22–5.81)
WBC: 5.5 10*3/uL (ref 4.0–10.5)

## 2011-07-20 LAB — BASIC METABOLIC PANEL
BUN: 20 mg/dL (ref 6–23)
CO2: 25 mEq/L (ref 19–32)
Chloride: 100 mEq/L (ref 96–112)
Creatinine, Ser: 0.53 mg/dL (ref 0.50–1.35)
Glucose, Bld: 113 mg/dL — ABNORMAL HIGH (ref 70–99)
Potassium: 3.7 mEq/L (ref 3.5–5.1)

## 2011-07-20 MED ORDER — METRONIDAZOLE 500 MG PO TABS
500.0000 mg | ORAL_TABLET | Freq: Three times a day (TID) | ORAL | Status: DC
  Administered 2011-07-20 – 2011-07-23 (×10): 500 mg via ORAL
  Filled 2011-07-20 (×16): qty 1

## 2011-07-20 NOTE — Clinical Documentation Improvement (Signed)
SEPSIS DOCUMENTATION QUERY  THIS DOCUMENT IS NOT A PERMANENT PART OF THE MEDICAL RECORD  TO RESPOND TO THE THIS QUERY, FOLLOW THE INSTRUCTIONS BELOW:  1. If needed, update documentation for the patient's encounter via the notes activity.  2. Access this query again and click edit on the In [employer].  3. After updating, or not, click F2 to complete all highlighted (required) fields concerning your review. Select "additional documentation in the medical record" OR "no additional documentation provided".  4. Click Sign note button.  5. The deficiency will fall out of your In Basket *Please let us know if you are not able to complete this workflow by phone or e-mail (listed below).  Please update your documentation within the medical record to reflect your response to this query.                                                                                    07/20/11  Dear Dr. Arthor Captain, Judie Petit Marton [place],  In a better effort to capture your patient's severity of illness, reflect appropriate length of stay and utilization of resources, a review of the patient medical record has revealed the following indicators.    Based on your clinical judgment, please clarify and document in a progress note and/or discharge summary the clinical condition associated with the following supporting information:  In responding to this query please exercise your independent judgment.  The fact that a query is asked, does not imply that any particular answer is desired or expected.   Pt admitted with UTI s/p suprapubic cath   According to H/P pt with H/O Sepsis and recurrent UTI   Pt' Temp= 100.9-100.2/ B/P 95/59, U/A > 100,000 Gram neg Rods necessitating treatment of  Vancomycin and Zosyn.    Please clarify whether or not pt's condition can qualify as one of the diagnoses listed below and document in pn and d/c summary.  Please note if condition was present on admission (POA)  Possible Clinical  Conditions?  Septicemia / Sepsis Severe Sepsis Neutropenic Sepsis  SIRS Septic Shock Sepsis with UTI due to suprapubic cath (device related)  Sepsis due to UTI Bacterial infection of unknown etiology / source Other Condition Cannot clinically Determine _______________  Risk Factors: UTI s/p suprapubic cath, Chr decub stage 4, quadriplegic, H/O Sepsis, and osteomyelitis.  Presenting Signs and Symptoms:  Diagnostics: Temp:  100.1 F (37.8 C)     100.9 F (38.3 C)   100.2 F (37.9 C)    07/18/10 B/P 95/59  07/19/11 96/63  U/A Component     Latest Ref Rng 07/18/2011  Colony Count      >=100,000 COLONIES/ML  Culture      GRAM NEGATIVE RODS    Component     Latest Ref Rng 07/19/2011  C difficile by pcr     NEGATIVE POSITIVE (A)   Radiology CXR: 07/18/11 IMPRESSION:  1.  Equivocal left lower lobe airspace opacity, although the left hemidiaphragm remains well seen. This could represent atelectasis or pneumonia. 2.  Bony deformities include stable left rib deformities and stable right inferior scapular deformity. 3.  The lung bases are partially obscured by the patient's protuberant abdominal  tissues. 4.  Mild cardiomegaly. 5.  Pleural thickening at the right lung apex and along the left chest, possibly some pleural adipose tissue or pleural fluid. 5.  Body habitus and motion artifact reduce diagnostic sensitivity and specificity.    Treatment: vancomycin (VANCOCIN) IVPB 1000 mg/200 mL premix    piperacillin-tazobactam (ZOSYN)   Reviewed: additional documentation in the medical record  Thank You,  Enis Slipper  RN, BSN, CCDS Clinical Documentation Specialist [facility] Dept Pager: [phone] / E-mail: [email or web address]  Health Information Management [place]

## 2011-07-20 NOTE — Progress Notes (Signed)
Lab notified this nurse re:  Positive c-diff, Dr Arthor Captain notified through text page, no  Answer at this time, patient on IV anbiotic

## 2011-07-20 NOTE — Progress Notes (Signed)
DAILY PROGRESS NOTE                              GENERAL INTERNAL MEDICINE [facility]  SUBJECTIVE: Feels much better, denies any fever or chills.  OBJECTIVE: BP 123/81  Pulse 95  Temp(Src) 98.7 F (37.1 C) (Oral)  Resp 18  Ht 6' (1.829 m)  Wt 113.399 kg (250 lb)  BMI 33.91 kg/m2  SpO2 98%  Intake/Output Summary (Last 24 hours) at 07/20/11 0816 Last data filed at 07/20/11 0755  Gross per 24 hour  Intake   1790 ml  Output   2650 ml  Net   -860 ml                      Weight change:  Physical Exam: General: Alert and awake oriented x3 not in any acute distress. HEENT: anicteric sclera, pupils equal reactive to light and accommodation CVS: S1-S2 heard, no murmur rubs or gallops Chest: clear to auscultation bilaterally, no wheezing rales or rhonchi Abdomen:  normal bowel sounds, soft, nontender, nondistended, no organomegaly Neuro: Cranial nerves II-XII intact, no focal neurological deficits Extremities: +1 edema noted bilaterally   Lab Results:  Basename 07/20/11 0500 07/19/11 0625  NA 132* 134*  K 3.7 3.8  CL 100 101  CO2 25 23  GLUCOSE 113* 129*  BUN 20 20  CREATININE 0.53 0.61  CALCIUM 8.5 8.6  MG -- --  PHOS -- --   Basename 07/20/11 0500 07/19/11 0625  WBC 5.5 8.8  NEUTROABS -- --  HGB 7.5* 8.3*  HCT 24.9* 27.3*  MCV 79.0 78.7  PLT 326 374  Micro Results: Recent Results (from the past 240 hour(s))  URINE CULTURE     Status: Normal (Preliminary result)   Collection Time   07/18/11  9:43 PM      Component Value Range Status Comment   Specimen Description URINE, SUPRAPUBIC   Final    Special Requests NONE   Final    Culture  Setup Time [id number]   Final    Colony Count >=100,000 COLONIES/ML   Final    Culture GRAM NEGATIVE RODS   Final    Report Status PENDING   Incomplete     Studies/Results: Dg Chest 2 View  07/18/2011  *RADIOLOGY REPORT*  Clinical Data: Fever.  Quadriplegia.  CHEST - 2 VIEW  Comparison: 07/01/2011  Findings: Lung bases  are partially obscured by the patient's protruding abdominal tissues.  Left pleural thickening may be from pleural adipose tissue or pleural effusion.  Bony irregularity along the inferior tip of right scapula is again noted.  Cardiomegaly is present.  Old left rib deformities are noted. Pleural thickening noted at the right lung apex.  Motion artifact and body habitus obscuring the lungs on the lateral projection.  There may be some faint left lower lobe airspace opacity.  IMPRESSION:  1.  Equivocal left lower lobe airspace opacity, although the left hemidiaphragm remains well seen. This could represent atelectasis or pneumonia. 2.  Bony deformities include stable left rib deformities and stable right inferior scapular deformity. 3.  The lung bases are partially obscured by the patient's protuberant abdominal tissues. 4.  Mild cardiomegaly. 5.  Pleural thickening at the right lung apex and along the left chest, possibly some pleural adipose tissue or pleural fluid. 5.  Body habitus and motion artifact reduce diagnostic sensitivity and specificity.  Original Report Authenticated By: Dellia Cloud, M.D.  Dg Chest 2 View  07/01/2011  *RADIOLOGY REPORT*  Clinical Data: Fever; hypertension.  CHEST - 2 VIEW  Comparison: Chest radiograph performed 02/25/2011  Findings: The lungs are well-aerated.  Minimal nodular densities at the left midlung and near the right lung apex appear chronic in nature; at the right lung apex, this likely reflects overlying osseous structures.  There is no evidence of focal opacification, pleural effusion or pneumothorax.  The lateral view is markedly suboptimal due to the patient's habitus and limitations in positioning.  The heart is enlarged.  Cervical spinal fusion hardware is partially imaged; no acute osseous abnormalities are identified.  IMPRESSION:  1.  No acute focal airspace consolidation seen. 2.  Chronic lung changes noted. 3.  Cardiomegaly.  Original Report Authenticated  By: Tonia Ghent, M.D.   Medications: Scheduled Meds:   . baclofen  20 mg Oral QID  . docusate sodium  100 mg Oral BID  . enoxaparin (LOVENOX) injection  60 mg Subcutaneous Q24H  . famotidine  20 mg Oral BID  . ferrous sulfate  325 mg Oral BID  . furosemide  20 mg Oral BID  . Juven  1 packet Oral BID  . metoCLOPramide  10 mg Oral TID  . piperacillin-tazobactam (ZOSYN)  IV  3.375 g Intravenous Q8H  . potassium chloride SA  20 mEq Oral BID  . sodium chloride  3 mL Intravenous Q12H  . vancomycin  1,000 mg Intravenous Q12H  . vitamin C  500 mg Oral Daily  . zinc sulfate  220 mg Oral Daily   Continuous Infusions:  PRN Meds:.sodium chloride, albuterol, HYDROmorphone, sodium chloride, sodium chloride  ASSESSMENT & PLAN: Principal Problem:  *UTI (lower urinary tract infection) Active Problems:  OSA (obstructive sleep apnea)  Obesity  Decubitus ulcer of buttock, stage 4   UTI -Urine culture showed more than 100,000 colonies.  - Patient is on Zosyn because of previous culture showed Escherichia coli/Pseudomonas  Decubitus ulcer -Wound care team signed off. -Decubitus ulcer no evidence of infection, pink granulation tissue some bleeding.  Chest x-ray infiltrates versus atelectasis -Patient is on vancomycin and Zosyn, considered pneumonia. -Does not have any symptoms of pneumonia, denies shortness of breath, cough or sputum production. -As well as incentive spirometry, I don't think patient has pneumonia.  Diarrhea -Per patient is improving. -Clostridium difficile PCR is pending.  Quadriplegia -Regular care. Continue muscle relaxants. -Continuous but pertinent.   LOS: 2 days   [name] [name] A 07/20/2011, 8:16 AM

## 2011-07-21 LAB — BASIC METABOLIC PANEL
BUN: 18 mg/dL (ref 6–23)
CO2: 25 mEq/L (ref 19–32)
Chloride: 104 mEq/L (ref 96–112)
Creatinine, Ser: 0.58 mg/dL (ref 0.50–1.35)
GFR calc Af Amer: >90 mL/min (ref >90)
Potassium: 3.9 mEq/L (ref 3.5–5.1)

## 2011-07-21 MED ORDER — DIPHENHYDRAMINE HCL 25 MG PO CAPS
25.0000 mg | ORAL_CAPSULE | Freq: Once | ORAL | Status: AC
  Administered 2011-07-21: 25 mg via ORAL
  Filled 2011-07-21: qty 1

## 2011-07-21 NOTE — Progress Notes (Signed)
Pt requested CPAP HS. Uses CPAP at home. On call provider notified and CPAP applied last night. R eye swelling of upper lid. Notified on call provider and administered PO benadryl per order. Swelling has decreased. Will continue to monitor. Eugene Garnet [facility] 07/21/2011 4:53 AM

## 2011-07-21 NOTE — Progress Notes (Signed)
Talked to patient about DCP; patent lives with a friend and has a private caregiver that comes out 7 days a week and stated that it is working out fine. Patient is currently going to school for communications. Patient also stated that everything was going well and that he does not need anything. Abelino Derrick RN, BSN, MHA

## 2011-07-21 NOTE — Progress Notes (Signed)
DAILY PROGRESS NOTE                              [facility]  SUBJECTIVE: Feels much better, bowel movements still lose, but only had 2 since yesterday morning  OBJECTIVE: BP 127/80  Pulse 91  Temp(Src) 99.2 F (37.3 C) (Oral)  Resp 20  Ht 6' (1.829 m)  Wt 113.399 kg (250 lb)  BMI 33.91 kg/m2  SpO2 98%  Intake/Output Summary (Last 24 hours) at 07/21/11 0904 Last data filed at 07/21/11 0500  Gross per 24 hour  Intake   1090 ml  Output   2800 ml  Net  -1710 ml                      Weight change:  Physical Exam: General: Alert and awake oriented x3 not in any acute distress. HEENT: anicteric sclera, pupils equal reactive to light and accommodation CVS: S1-S2 heard, no murmur rubs or gallops Chest: clear to auscultation bilaterally, no wheezing rales or rhonchi Abdomen:  normal bowel sounds, soft, nontender, nondistended, no organomegaly Neuro: Cranial nerves II-XII intact, no focal neurological deficits Extremities: +1 edema noted bilaterally   Lab Results:  Basename 07/21/11 0610 07/20/11 0500  NA 135 132*  K 3.9 3.7  CL 104 100  CO2 25 25  GLUCOSE 106* 113*  BUN 18 20  CREATININE 0.58 0.53  CALCIUM 8.7 8.5  MG -- --  PHOS -- --    Basename 07/20/11 0500 07/19/11 0625  WBC 5.5 8.8  NEUTROABS -- --  HGB 7.5* 8.3*  HCT 24.9* 27.3*  MCV 79.0 78.7  PLT 326 374  Micro Results: Recent Results (from the past 240 hour(s))  URINE CULTURE     Status: Normal   Collection Time   07/18/11  9:43 PM      Component Value Range Status Comment   Specimen Description URINE, SUPRAPUBIC   Final    Special Requests NONE   Final    Culture  Setup Time [id number]   Final    Colony Count >=100,000 COLONIES/ML   Final    Culture PSEUDOMONAS AERUGINOSA   Final    Report Status 07/20/2011 FINAL   Final    Organism ID, Bacteria PSEUDOMONAS AERUGINOSA   Final   CLOSTRIDIUM DIFFICILE BY PCR     Status: Abnormal   Collection Time   07/19/11  4:21 PM       Component Value Range Status Comment   C difficile by pcr POSITIVE (*) NEGATIVE  Final   MRSA PCR SCREENING     Status: Normal   Collection Time   07/20/11 11:30 AM      Component Value Range Status Comment   MRSA by PCR NEGATIVE  NEGATIVE  Final     Studies/Results: Dg Chest 2 View  07/18/2011  *RADIOLOGY REPORT*  Clinical Data: Fever.  Quadriplegia.  CHEST - 2 VIEW  Comparison: 07/01/2011  Findings: Lung bases are partially obscured by the patient's protruding abdominal tissues.  Left pleural thickening may be from pleural adipose tissue or pleural effusion.  Bony irregularity along the inferior tip of right scapula is again noted.  Cardiomegaly is present.  Old left rib deformities are noted. Pleural thickening noted at the right lung apex.  Motion artifact and body habitus obscuring the lungs on the lateral projection.  There may be some faint left lower lobe airspace opacity.  IMPRESSION:  1.  Equivocal left lower lobe airspace opacity, although the left hemidiaphragm remains well seen. This could represent atelectasis or pneumonia. 2.  Bony deformities include stable left rib deformities and stable right inferior scapular deformity. 3.  The lung bases are partially obscured by the patient's protuberant abdominal tissues. 4.  Mild cardiomegaly. 5.  Pleural thickening at the right lung apex and along the left chest, possibly some pleural adipose tissue or pleural fluid. 5.  Body habitus and motion artifact reduce diagnostic sensitivity and specificity.  Original Report Authenticated By: Dellia Cloud, M.D.   Dg Chest 2 View  07/01/2011  *RADIOLOGY REPORT*  Clinical Data: Fever; hypertension.  CHEST - 2 VIEW  Comparison: Chest radiograph performed 02/25/2011  Findings: The lungs are well-aerated.  Minimal nodular densities at the left midlung and near the right lung apex appear chronic in nature; at the right lung apex, this likely reflects overlying osseous structures.  There is no  evidence of focal opacification, pleural effusion or pneumothorax.  The lateral view is markedly suboptimal due to the patient's habitus and limitations in positioning.  The heart is enlarged.  Cervical spinal fusion hardware is partially imaged; no acute osseous abnormalities are identified.  IMPRESSION:  1.  No acute focal airspace consolidation seen. 2.  Chronic lung changes noted. 3.  Cardiomegaly.  Original Report Authenticated By: Tonia Ghent, M.D.   Medications: Scheduled Meds:    . baclofen  20 mg Oral QID  . diphenhydrAMINE  25 mg Oral Once  . docusate sodium  100 mg Oral BID  . enoxaparin (LOVENOX) injection  60 mg Subcutaneous Q24H  . famotidine  20 mg Oral BID  . ferrous sulfate  325 mg Oral BID  . furosemide  20 mg Oral BID  . Juven  1 packet Oral BID  . metoCLOPramide  10 mg Oral TID  . metroNIDAZOLE  500 mg Oral Q8H  . piperacillin-tazobactam (ZOSYN)  IV  3.375 g Intravenous Q8H  . potassium chloride SA  20 mEq Oral BID  . sodium chloride  3 mL Intravenous Q12H  . vitamin C  500 mg Oral Daily  . zinc sulfate  220 mg Oral Daily  . DISCONTD: vancomycin  1,000 mg Intravenous Q12H   Continuous Infusions:  PRN Meds:.sodium chloride, albuterol, HYDROmorphone, sodium chloride, sodium chloride  ASSESSMENT & PLAN: Principal Problem:  *UTI (lower urinary tract infection) Active Problems:  OSA (obstructive sleep apnea)  Obesity  Decubitus ulcer of buttock, stage 4   Pseudomonas UTI -This is indwelling Foley catheter associated UTI. Urine culture showed more than 100,000 colonies.  - Patient is on Zosyn waiting on the susceptibility of the Pseudomonas.  C. difficile colitis -Started on oral Flagyl, not a lot of loose bowel movements. -Likely secondary to recurrent antibiotics use.  Decubitus ulcer -Wound care team signed off. -Decubitus ulcer no evidence of infection, pink granulation tissue some bleeding.  Chest x-ray infiltrates versus atelectasis -Patient is on  vancomycin and Zosyn, considered pneumonia. -Does not have any symptoms of pneumonia, denies shortness of breath, cough or sputum production. -As well as incentive spirometry, I don't think patient has pneumonia.  Diarrhea -Per patient is improving. -Clostridium difficile PCR is pending.  Quadriplegia -Regular care. Continue muscle relaxants. -Continuous but pertinent.   LOS: 3 days   Sheryn Aldaz A 07/21/2011, 9:04 AM

## 2011-07-22 MED ORDER — DEXTROSE 5 % IV SOLN
1.0000 g | Freq: Three times a day (TID) | INTRAVENOUS | Status: DC
  Administered 2011-07-22 – 2011-07-23 (×4): 1 g via INTRAVENOUS
  Filled 2011-07-22 (×6): qty 1
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
## 2011-07-22 NOTE — Progress Notes (Signed)
DAILY PROGRESS NOTE                              GENERAL [facility]IC[facility]  SUBJECTIVE: Feels much better, bowel movements still lose, but only had 2 since yesterday morning  OBJECTIVE: BP 151/89  Pulse 68  Temp(Src) 97.2 F (36.2 C) (Axillary)  Resp 18  Ht 6' (1.829 m)  Wt 113.399 kg (250 lb)  BMI 33.91 kg/m2  SpO2 100%  Intake/Output Summary (Last 24 hours) at 07/22/11 1056 Last data filed at 07/22/11 0826  Gross per 24 hour  Intake    960 ml  Output   2250 ml  Net  -1290 ml                      Weight change:  Physical Exam: General: Alert and awake oriented x3 not in any acute distress. HEENT: anicteric sclera, pupils equal reactive to light and accommodation CVS: S1-S2 heard, no murmur rubs or gallops Chest: clear to auscultation bilaterally, no wheezing rales or rhonchi Abdomen:  normal bowel sounds, soft, nontender, nondistended, no organomegaly Neuro: Cranial nerves II-XII intact, no focal neurological deficits Extremities: +1 edema noted bilaterally   Lab Results:  Basename 07/21/11 0610 07/20/11 0500  NA 135 132*  K 3.9 3.7  CL 104 100  CO2 25 25  GLUCOSE 106* 113*  BUN 18 20  CREATININE 0.58 0.53  CALCIUM 8.7 8.5  MG -- --  PHOS -- --    Basename 07/20/11 0500  WBC 5.5  NEUTROABS --  HGB 7.5*  HCT 24.9*  MCV 79.0  PLT 326  Micro Results: Recent Results (from the past 240 hour(s))  URINE CULTURE     Status: Normal   Collection Time   07/18/11  9:43 PM      Component Value Range Status Comment   Specimen Description URINE, SUPRAPUBIC   Final    Special Requests NONE   Final    Culture  Setup Time [id number]   Final    Colony Count >=100,000 COLONIES/ML   Final    Culture PSEUDOMONAS AERUGINOSA   Final    Report Status 07/20/2011 FINAL   Final    Organism ID, Bacteria PSEUDOMONAS AERUGINOSA   Final   CLOSTRIDIUM DIFFICILE BY PCR     Status: Abnormal   Collection Time   07/19/11  4:21 PM      Component Value Range Status  Comment   C difficile by pcr POSITIVE (*) NEGATIVE  Final   MRSA PCR SCREENING     Status: Normal   Collection Time   07/20/11 11:30 AM      Component Value Range Status Comment   MRSA by PCR NEGATIVE  NEGATIVE  Final     Studies/Results: Dg Chest 2 View  07/18/2011  *RADIOLOGY REPORT*  Clinical Data: Fever.  Quadriplegia.  CHEST - 2 VIEW  Comparison: 07/01/2011  Findings: Lung bases are partially obscured by the patient's protruding abdominal tissues.  Left pleural thickening may be from pleural adipose tissue or pleural effusion.  Bony irregularity along the inferior tip of right scapula is again noted.  Cardiomegaly is present.  Old left rib deformities are noted. Pleural thickening noted at the right lung apex.  Motion artifact and body habitus obscuring the lungs on the lateral projection.  There may be some faint left lower lobe airspace opacity.  IMPRESSION:  1.  Equivocal left lower lobe  airspace opacity, although the left hemidiaphragm remains well seen. This could represent atelectasis or pneumonia. 2.  Bony deformities include stable left rib deformities and stable right inferior scapular deformity. 3.  The lung bases are partially obscured by the patient's protuberant abdominal tissues. 4.  Mild cardiomegaly. 5.  Pleural thickening at the right lung apex and along the left chest, possibly some pleural adipose tissue or pleural fluid. 5.  Body habitus and motion artifact reduce diagnostic sensitivity and specificity.  Original Report Authenticated By: Dellia Cloud, M.D.   Dg Chest 2 View  07/01/2011  *RADIOLOGY REPORT*  Clinical Data: Fever; hypertension.  CHEST - 2 VIEW  Comparison: Chest radiograph performed 02/25/2011  Findings: The lungs are well-aerated.  Minimal nodular densities at the left midlung and near the right lung apex appear chronic in nature; at the right lung apex, this likely reflects overlying osseous structures.  There is no evidence of focal opacification, pleural  effusion or pneumothorax.  The lateral view is markedly suboptimal due to the patient's habitus and limitations in positioning.  The heart is enlarged.  Cervical spinal fusion hardware is partially imaged; no acute osseous abnormalities are identified.  IMPRESSION:  1.  No acute focal airspace consolidation seen. 2.  Chronic lung changes noted. 3.  Cardiomegaly.  Original Report Authenticated By: Tonia Ghent, M.D.   Medications: Scheduled Meds:    . baclofen  20 mg Oral QID  . docusate sodium  100 mg Oral BID  . enoxaparin (LOVENOX) injection  60 mg Subcutaneous Q24H  . famotidine  20 mg Oral BID  . ferrous sulfate  325 mg Oral BID  . furosemide  20 mg Oral BID  . Juven  1 packet Oral BID  . metoCLOPramide  10 mg Oral TID  . metroNIDAZOLE  500 mg Oral Q8H  . piperacillin-tazobactam (ZOSYN)  IV  3.375 g Intravenous Q8H  . potassium chloride SA  20 mEq Oral BID  . sodium chloride  3 mL Intravenous Q12H  . vitamin C  500 mg Oral Daily  . zinc sulfate  220 mg Oral Daily   Continuous Infusions:  PRN Meds:.sodium chloride, albuterol, HYDROmorphone, sodium chloride, sodium chloride  ASSESSMENT & PLAN: Principal Problem:  *UTI (lower urinary tract infection) Active Problems:  OSA (obstructive sleep apnea)  Obesity  Decubitus ulcer of buttock, stage 4   Pseudomonas UTI -This is indwelling Foley catheter associated UTI.   - Patient is on Zosyn.  C. difficile colitis -Started on oral Flagyl, bowel movements start to be semi-formed. -Likely secondary to recurrent antibiotics use.  Decubitus ulcer -Wound care team signed off. -Decubitus ulcer no evidence of infection, pink granulation tissue some bleeding.  Chest x-ray infiltrates versus atelectasis -Patient is on vancomycin and Zosyn, considered pneumonia. -Does not have any symptoms of pneumonia, denies shortness of breath, cough or sputum production. -As well as incentive spirometry, I don't think patient has  pneumonia.  Quadriplegia -Regular care. Continue muscle relaxants.   LOS: 4 days   Renay Crammer A 07/22/2011, 10:56 AM

## 2011-07-23 DIAGNOSIS — A498 Other bacterial infections of unspecified site: Secondary | ICD-10-CM | POA: Diagnosis present

## 2011-07-23 DIAGNOSIS — D649 Anemia, unspecified: Secondary | ICD-10-CM | POA: Diagnosis present

## 2011-07-23 DIAGNOSIS — G8250 Quadriplegia, unspecified: Secondary | ICD-10-CM | POA: Diagnosis present

## 2011-07-23 LAB — BASIC METABOLIC PANEL
CO2: 28 mEq/L (ref 19–32)
Calcium: 9.1 mg/dL (ref 8.4–10.5)
Chloride: 106 mEq/L (ref 96–112)
Creatinine, Ser: 0.48 mg/dL — ABNORMAL LOW (ref 0.50–1.35)
Glucose, Bld: 82 mg/dL (ref 70–99)
Sodium: 139 mEq/L (ref 135–145)

## 2011-07-23 MED ORDER — FLORA-Q PO CAPS: 1.0000 | ORAL_CAPSULE | Freq: Two times a day (BID) | ORAL | Status: DC

## 2011-07-23 MED ORDER — CEFTAZIDIME 1 G IV SOLR: 1.0000 g | Freq: Three times a day (TID) | INTRAVENOUS | Status: DC

## 2011-07-23 MED ORDER — CIPROFLOXACIN HCL 0.3 % OP SOLN: 1.0000 [drp] | OPHTHALMIC | Status: AC

## 2011-07-23 MED ORDER — METRONIDAZOLE 500 MG PO TABS: 500.0000 mg | ORAL_TABLET | Freq: Three times a day (TID) | ORAL | Status: AC

## 2011-07-23 NOTE — Progress Notes (Signed)
Placed patient on cpap on previous settings with full face mask. Patient tolerating well at this time.

## 2011-07-23 NOTE — Progress Notes (Signed)
Cm spoke with pt concerning d/c planning. MD order for [facility], PICC protocol,  IV ABX, for 10 days. Per pt choice [facility] to provide [facility] services. Rn to provide pt with afternoon ABx dose prior to discharge. [facility] to provide evening dose of ABX. Pt has private duty aide from separate care agency. [facility] to provide non-emergent transportation home. CSW notified.   Leonie Green [phone]
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
## 2011-08-24 ENCOUNTER — Encounter (HOSPITAL_BASED_OUTPATIENT_CLINIC_OR_DEPARTMENT_OTHER): Payer: Medicare Other | Attending: Plastic Surgery

## 2011-08-24 DIAGNOSIS — L8994 Pressure ulcer of unspecified site, stage 4: Secondary | ICD-10-CM | POA: Insufficient documentation

## 2011-08-24 DIAGNOSIS — L89309 Pressure ulcer of unspecified buttock, unspecified stage: Secondary | ICD-10-CM | POA: Insufficient documentation

## 2011-08-24 DIAGNOSIS — L8993 Pressure ulcer of unspecified site, stage 3: Secondary | ICD-10-CM | POA: Insufficient documentation

## 2011-08-24 DIAGNOSIS — Z933 Colostomy status: Secondary | ICD-10-CM | POA: Insufficient documentation

## 2011-08-24 DIAGNOSIS — L89109 Pressure ulcer of unspecified part of back, unspecified stage: Secondary | ICD-10-CM | POA: Insufficient documentation

## 2011-08-24 DIAGNOSIS — L89899 Pressure ulcer of other site, unspecified stage: Secondary | ICD-10-CM | POA: Insufficient documentation

## 2011-08-24 DIAGNOSIS — G8220 Paraplegia, unspecified: Secondary | ICD-10-CM | POA: Insufficient documentation

## 2011-09-11 ENCOUNTER — Encounter (HOSPITAL_COMMUNITY): Payer: Self-pay | Admitting: Emergency Medicine

## 2011-09-11 ENCOUNTER — Emergency Department (HOSPITAL_COMMUNITY): Payer: Medicare Other

## 2011-09-11 ENCOUNTER — Emergency Department (HOSPITAL_COMMUNITY)
Admission: EM | Admit: 2011-09-11 | Discharge: 2011-09-11 | Disposition: A | Discharge status: Home / Self Care | Payer: Medicare Other | Attending: Emergency Medicine | Admitting: Emergency Medicine

## 2011-09-11 DIAGNOSIS — I10 Essential (primary) hypertension: Secondary | ICD-10-CM | POA: Insufficient documentation

## 2011-09-11 DIAGNOSIS — R0609 Other forms of dyspnea: Secondary | ICD-10-CM | POA: Insufficient documentation

## 2011-09-11 DIAGNOSIS — G40909 Epilepsy, unspecified, not intractable, without status epilepticus: Secondary | ICD-10-CM | POA: Insufficient documentation

## 2011-09-11 DIAGNOSIS — Z87898 Personal history of other specified conditions: Secondary | ICD-10-CM | POA: Insufficient documentation

## 2011-09-11 DIAGNOSIS — J3489 Other specified disorders of nose and nasal sinuses: Secondary | ICD-10-CM | POA: Insufficient documentation

## 2011-09-11 DIAGNOSIS — G8250 Quadriplegia, unspecified: Secondary | ICD-10-CM | POA: Insufficient documentation

## 2011-09-11 DIAGNOSIS — E662 Morbid (severe) obesity with alveolar hypoventilation: Secondary | ICD-10-CM | POA: Insufficient documentation

## 2011-09-11 DIAGNOSIS — R0602 Shortness of breath: Secondary | ICD-10-CM | POA: Insufficient documentation

## 2011-09-11 DIAGNOSIS — R0600 Dyspnea, unspecified: Secondary | ICD-10-CM

## 2011-09-11 DIAGNOSIS — R0989 Other specified symptoms and signs involving the circulatory and respiratory systems: Secondary | ICD-10-CM | POA: Insufficient documentation

## 2011-09-11 DIAGNOSIS — Z8669 Personal history of other diseases of the nervous system and sense organs: Secondary | ICD-10-CM

## 2011-09-11 LAB — BASIC METABOLIC PANEL
BUN: 17 mg/dL (ref 6–23)
CO2: 25 mEq/L (ref 19–32)
Calcium: 9.4 mg/dL (ref 8.4–10.5)
Creatinine, Ser: 0.5 mg/dL (ref 0.50–1.35)
GFR calc Af Amer: >90 mL/min (ref >90)

## 2011-09-11 LAB — TROPONIN I: Troponin I: <0.3 ng/mL (ref <0.30)

## 2011-09-11 MED ORDER — AZITHROMYCIN 250 MG PO TABS: ORAL_TABLET | ORAL | Status: DC
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
## 2011-09-11 NOTE — ED Notes (Signed)
[id number]<ZY> Expected date:<BR> Expected time:<BR> Means of arrival:<BR> Comments:<BR> M212- quad pt c/o of cough and congestion

## 2011-09-11 NOTE — ED Notes (Signed)
Cough, congestion x 1 week. SOB x3 hours

## 2011-09-13 ENCOUNTER — Emergency Department (HOSPITAL_COMMUNITY)
Admission: EM | Admit: 2011-09-13 | Discharge: 2011-09-14 | Disposition: A | Discharge status: Home / Self Care | Payer: Medicare Other | Attending: Emergency Medicine | Admitting: Emergency Medicine

## 2011-09-13 DIAGNOSIS — G4733 Obstructive sleep apnea (adult) (pediatric): Secondary | ICD-10-CM | POA: Insufficient documentation

## 2011-09-13 DIAGNOSIS — N390 Urinary tract infection, site not specified: Secondary | ICD-10-CM | POA: Insufficient documentation

## 2011-09-13 DIAGNOSIS — I10 Essential (primary) hypertension: Secondary | ICD-10-CM | POA: Insufficient documentation

## 2011-09-13 DIAGNOSIS — Z79899 Other long term (current) drug therapy: Secondary | ICD-10-CM | POA: Insufficient documentation

## 2011-09-13 DIAGNOSIS — G40909 Epilepsy, unspecified, not intractable, without status epilepticus: Secondary | ICD-10-CM | POA: Insufficient documentation

## 2011-09-13 DIAGNOSIS — R609 Edema, unspecified: Secondary | ICD-10-CM | POA: Insufficient documentation

## 2011-09-13 DIAGNOSIS — E669 Obesity, unspecified: Secondary | ICD-10-CM | POA: Insufficient documentation

## 2011-09-13 DIAGNOSIS — R000 Tachycardia, unspecified: Secondary | ICD-10-CM | POA: Insufficient documentation

## 2011-09-13 DIAGNOSIS — G8250 Quadriplegia, unspecified: Secondary | ICD-10-CM | POA: Insufficient documentation
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
## 2011-09-14 ENCOUNTER — Encounter (HOSPITAL_COMMUNITY): Payer: Self-pay | Admitting: *Deleted

## 2011-09-14 LAB — URINALYSIS, ROUTINE W REFLEX MICROSCOPIC
Bilirubin Urine: NEGATIVE
Ketones, ur: NEGATIVE mg/dL
Nitrite: NEGATIVE
pH: 8 (ref 5.0–8.0)

## 2011-09-14 LAB — URINE MICROSCOPIC-ADD ON

## 2011-09-14 MED ORDER — CIPROFLOXACIN HCL 500 MG PO TABS: 500.0000 mg | ORAL_TABLET | Freq: Two times a day (BID) | ORAL | Status: AC

## 2011-09-14 NOTE — ED Notes (Signed)
Pt discharged via ptar.  No acute distressed noted.  Pt verbalizes understanding

## 2011-09-14 NOTE — Discharge Instructions (Signed)
Your urine sample shows infection, take the medications as prescribed, return to the hospital for severe or worsening pain, vomiting, fevers. You should have your urologist change or suprapubic catheter in the next week as this may prevent appropriate treatment of your infection. Take ciprofloxacin twice a day for 10 days. A urine culture has been sent, and ciprofloxacin as not the right medication someone will give you a phone call in change this over the phone.
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
## 2011-09-16 LAB — URINE CULTURE: Colony Count: >=100000

## 2011-09-17 NOTE — ED Notes (Signed)
+  Urine. Patient treated with Cipro. Sensitive to same. Per protocol MD. °

## 2011-09-21 ENCOUNTER — Encounter (HOSPITAL_BASED_OUTPATIENT_CLINIC_OR_DEPARTMENT_OTHER): Payer: Medicare Other | Attending: Plastic Surgery

## 2011-09-21 DIAGNOSIS — G8220 Paraplegia, unspecified: Secondary | ICD-10-CM | POA: Insufficient documentation

## 2011-09-21 DIAGNOSIS — L8994 Pressure ulcer of unspecified site, stage 4: Secondary | ICD-10-CM | POA: Insufficient documentation

## 2011-09-21 DIAGNOSIS — Z933 Colostomy status: Secondary | ICD-10-CM | POA: Insufficient documentation

## 2011-09-21 DIAGNOSIS — L89899 Pressure ulcer of other site, unspecified stage: Secondary | ICD-10-CM | POA: Insufficient documentation

## 2011-09-21 DIAGNOSIS — L89309 Pressure ulcer of unspecified buttock, unspecified stage: Secondary | ICD-10-CM | POA: Insufficient documentation

## 2011-09-21 DIAGNOSIS — L89109 Pressure ulcer of unspecified part of back, unspecified stage: Secondary | ICD-10-CM | POA: Insufficient documentation

## 2011-09-21 DIAGNOSIS — L8993 Pressure ulcer of unspecified site, stage 3: Secondary | ICD-10-CM | POA: Insufficient documentation

## 2011-10-17 ENCOUNTER — Encounter (HOSPITAL_BASED_OUTPATIENT_CLINIC_OR_DEPARTMENT_OTHER): Payer: Medicare Other | Attending: Plastic Surgery

## 2011-10-17 DIAGNOSIS — Z79899 Other long term (current) drug therapy: Secondary | ICD-10-CM | POA: Insufficient documentation

## 2011-10-17 DIAGNOSIS — I10 Essential (primary) hypertension: Secondary | ICD-10-CM | POA: Insufficient documentation

## 2011-10-17 DIAGNOSIS — G8250 Quadriplegia, unspecified: Secondary | ICD-10-CM | POA: Insufficient documentation

## 2011-10-17 DIAGNOSIS — L89309 Pressure ulcer of unspecified buttock, unspecified stage: Secondary | ICD-10-CM | POA: Insufficient documentation

## 2011-10-17 DIAGNOSIS — L89509 Pressure ulcer of unspecified ankle, unspecified stage: Secondary | ICD-10-CM | POA: Insufficient documentation

## 2011-10-17 DIAGNOSIS — L89899 Pressure ulcer of other site, unspecified stage: Secondary | ICD-10-CM | POA: Insufficient documentation

## 2011-10-17 DIAGNOSIS — L8994 Pressure ulcer of unspecified site, stage 4: Secondary | ICD-10-CM | POA: Insufficient documentation

## 2011-11-14 ENCOUNTER — Encounter (HOSPITAL_BASED_OUTPATIENT_CLINIC_OR_DEPARTMENT_OTHER): Payer: Medicare Other

## 2011-12-03 ENCOUNTER — Emergency Department (HOSPITAL_COMMUNITY)
Admission: EM | Admit: 2011-12-03 | Discharge: 2011-12-04 | Disposition: A | Discharge status: Home / Self Care | Payer: Medicare Other | Attending: Emergency Medicine | Admitting: Emergency Medicine

## 2011-12-03 ENCOUNTER — Encounter (HOSPITAL_COMMUNITY): Payer: Self-pay | Admitting: Emergency Medicine

## 2011-12-03 DIAGNOSIS — Z79899 Other long term (current) drug therapy: Secondary | ICD-10-CM | POA: Insufficient documentation

## 2011-12-03 DIAGNOSIS — G40909 Epilepsy, unspecified, not intractable, without status epilepticus: Secondary | ICD-10-CM | POA: Insufficient documentation

## 2011-12-03 DIAGNOSIS — I10 Essential (primary) hypertension: Secondary | ICD-10-CM | POA: Insufficient documentation

## 2011-12-03 DIAGNOSIS — N390 Urinary tract infection, site not specified: Secondary | ICD-10-CM | POA: Insufficient documentation

## 2011-12-03 DIAGNOSIS — G8250 Quadriplegia, unspecified: Secondary | ICD-10-CM | POA: Insufficient documentation

## 2011-12-03 DIAGNOSIS — R509 Fever, unspecified: Secondary | ICD-10-CM | POA: Insufficient documentation

## 2011-12-03 DIAGNOSIS — G4733 Obstructive sleep apnea (adult) (pediatric): Secondary | ICD-10-CM | POA: Insufficient documentation

## 2011-12-03 DIAGNOSIS — IMO0002 Reserved for concepts with insufficient information to code with codable children: Secondary | ICD-10-CM | POA: Insufficient documentation

## 2011-12-03 NOTE — ED Notes (Signed)
WGN:[id number]> Expected date:12/03/11<BR> Expected time:10:58 PM<BR> Means of arrival:Ambulance<BR> Comments:<BR> Quad, fever, Rm 10
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
## 2011-12-03 NOTE — ED Notes (Signed)
Pt states he was feeling congested today and tried to eat dinner. Began having a HA around 7pm. Pt is a quadriplegic.

## 2011-12-04 LAB — URINALYSIS, ROUTINE W REFLEX MICROSCOPIC
Glucose, UA: NEGATIVE mg/dL
Ketones, ur: NEGATIVE mg/dL
Nitrite: POSITIVE — AB
Specific Gravity, Urine: 1.009 (ref 1.005–1.030)
pH: 6 (ref 5.0–8.0)

## 2011-12-04 LAB — URINE MICROSCOPIC-ADD ON

## 2011-12-04 MED ORDER — CIPROFLOXACIN HCL 500 MG PO TABS
500.0000 mg | ORAL_TABLET | Freq: Once | ORAL | Status: AC
  Administered 2011-12-04: 500 mg via ORAL
  Filled 2011-12-04: qty 1

## 2011-12-04 MED ORDER — FLUCONAZOLE 200 MG PO TABS
200.0000 mg | ORAL_TABLET | Freq: Once | ORAL | Status: AC
  Administered 2011-12-04: 200 mg via ORAL
  Filled 2011-12-04: qty 1

## 2011-12-04 MED ORDER — IBUPROFEN 800 MG PO TABS
800.0000 mg | ORAL_TABLET | Freq: Once | ORAL | Status: AC
  Administered 2011-12-04: 800 mg via ORAL
  Filled 2011-12-04: qty 1

## 2011-12-04 MED ORDER — CIPROFLOXACIN HCL 500 MG PO TABS: 500.0000 mg | ORAL_TABLET | Freq: Two times a day (BID) | ORAL | Status: AC

## 2011-12-04 NOTE — ED Notes (Signed)
Per Charge RN, Fleet Contras, evaluate the wounds and make recommendations for care based on assessment.
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
## 2011-12-04 NOTE — ED Notes (Addendum)
Wound assessed and dressings changed. There are three main Pressure ulcers. 1. Rt buttock is unstagable d/t slough. Active bleeding. No pus or foul discharge noted. 2. Sacrum is Stage III. Active bleeding. No pus or foul discharge noted. 3. Lt buttock is stage III. Active bleeding. No pus or foul discharge noted.  Recommend at least changing dressing once per day with daily bath and to turn frequently throughout the day.

## 2011-12-04 NOTE — ED Notes (Signed)
Pt is requesting that we evaluate his pressure ulcers. Informed the MD and spoke with his caregiver. Antibiotics being given and informed they should increase the wound dressing changes and keep the [facility] Appt he has scheduled for next week. Caregiver was unsatisfied with this response and requested to speak with the charge RN. Fleet Contras spoke with her reiterating the same information.

## 2011-12-04 NOTE — ED Notes (Signed)
Pt left with PTAR 

## 2011-12-07 LAB — URINE CULTURE

## 2011-12-08 NOTE — ED Notes (Signed)
+   Urine Patient treated with Cirpo-sensitive to same-chart appended per protocol MD. 

## 2011-12-12 ENCOUNTER — Encounter (HOSPITAL_BASED_OUTPATIENT_CLINIC_OR_DEPARTMENT_OTHER): Payer: Medicare Other | Attending: Plastic Surgery

## 2011-12-12 DIAGNOSIS — L98499 Non-pressure chronic ulcer of skin of other sites with unspecified severity: Secondary | ICD-10-CM | POA: Insufficient documentation

## 2011-12-13 NOTE — Progress Notes (Signed)
[facility]  NAME:  [name] [name] NO.:  [id number]  MEDICAL RECORD NO.:  [id number]      DATE OF BIRTH:  February 20, 1966  PHYSICIAN:  Wayland Denis, DO       VISIT DATE:  12/12/2011                                  OFFICE VISIT   The patient is a 46 year old gentleman, who is here for followup on his sacral ulcers.  One on the right side, looks to be with some thick fibrous tissue at the base.  The other ones are actually improved and is much smaller than they had been.  The one on the right is actually smaller than it was with better granulation tissue on the majority of it, but the deeper part is fibrous and thick.  There has been no change in his medications or social history.  On exam, he is alert, oriented, cooperative, not in any acute distress. He is pleasant.  His nurse aide is with him.  His breathing is unlabored.  His heart is regular.  His knee is improved as well with good granulation tissue.  No sign of infection.  No fibrous tissue. Continue with collagen on his knee and sacrum, and wet to dry with Dakin's on the right sacral part, and we will work him up for an irrigation and debridement in the OR.     Wayland Denis, DO     CS/MEDQ  D:  12/12/2011  T:  12/13/2011  Job:  [id number]

## 2011-12-19 ENCOUNTER — Encounter (HOSPITAL_COMMUNITY): Payer: Self-pay | Admitting: Emergency Medicine

## 2011-12-19 ENCOUNTER — Emergency Department (HOSPITAL_COMMUNITY)
Admission: EM | Admit: 2011-12-19 | Discharge: 2011-12-20 | Disposition: A | Discharge status: Home / Self Care | Payer: Medicare Other | Attending: Emergency Medicine | Admitting: Emergency Medicine

## 2011-12-19 DIAGNOSIS — G8250 Quadriplegia, unspecified: Secondary | ICD-10-CM | POA: Insufficient documentation

## 2011-12-19 DIAGNOSIS — R1033 Periumbilical pain: Secondary | ICD-10-CM | POA: Insufficient documentation

## 2011-12-19 DIAGNOSIS — I10 Essential (primary) hypertension: Secondary | ICD-10-CM | POA: Insufficient documentation

## 2011-12-19 DIAGNOSIS — R110 Nausea: Secondary | ICD-10-CM | POA: Insufficient documentation

## 2011-12-19 DIAGNOSIS — Z933 Colostomy status: Secondary | ICD-10-CM | POA: Insufficient documentation

## 2011-12-19 DIAGNOSIS — Z981 Arthrodesis status: Secondary | ICD-10-CM | POA: Insufficient documentation

## 2011-12-19 DIAGNOSIS — R109 Unspecified abdominal pain: Secondary | ICD-10-CM

## 2011-12-19 DIAGNOSIS — G40909 Epilepsy, unspecified, not intractable, without status epilepticus: Secondary | ICD-10-CM | POA: Insufficient documentation

## 2011-12-19 LAB — URINE MICROSCOPIC-ADD ON

## 2011-12-19 LAB — URINALYSIS, ROUTINE W REFLEX MICROSCOPIC
Bilirubin Urine: NEGATIVE
Ketones, ur: NEGATIVE mg/dL
Nitrite: POSITIVE — AB
Protein, ur: NEGATIVE mg/dL
pH: 6.5 (ref 5.0–8.0)

## 2011-12-19 NOTE — ED Notes (Signed)
Brought in by EMS from home with c/o abdominal pain with nausea and vomiting, onset this evening at around 1700; pt denies diarrhea.

## 2011-12-19 NOTE — ED Notes (Signed)
[id number]<JY> Expected date:<BR> Expected time:<BR> Means of arrival:<BR> Comments:<BR> Quad with nausea

## 2011-12-20 ENCOUNTER — Emergency Department (HOSPITAL_COMMUNITY): Payer: Medicare Other

## 2011-12-20 MED ORDER — ONDANSETRON 4 MG PO TBDP
4.0000 mg | ORAL_TABLET | Freq: Once | ORAL | Status: AC
  Administered 2011-12-20: 4 mg via ORAL
  Filled 2011-12-20: qty 1
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
## 2012-01-17 ENCOUNTER — Inpatient Hospital Stay (HOSPITAL_COMMUNITY)
Admission: EM | Admit: 2012-01-17 | Discharge: 2012-01-24 | DRG: 689 | Disposition: A | Discharge status: Home Health | Payer: Medicare Other | Attending: Family Medicine | Admitting: Family Medicine

## 2012-01-17 ENCOUNTER — Inpatient Hospital Stay (HOSPITAL_COMMUNITY): Payer: Medicare Other

## 2012-01-17 ENCOUNTER — Emergency Department (HOSPITAL_COMMUNITY): Payer: Medicare Other

## 2012-01-17 ENCOUNTER — Encounter (HOSPITAL_COMMUNITY): Payer: Self-pay

## 2012-01-17 DIAGNOSIS — E669 Obesity, unspecified: Secondary | ICD-10-CM

## 2012-01-17 DIAGNOSIS — J189 Pneumonia, unspecified organism: Secondary | ICD-10-CM

## 2012-01-17 DIAGNOSIS — A419 Sepsis, unspecified organism: Secondary | ICD-10-CM

## 2012-01-17 DIAGNOSIS — J9610 Chronic respiratory failure, unspecified whether with hypoxia or hypercapnia: Secondary | ICD-10-CM | POA: Diagnosis present

## 2012-01-17 DIAGNOSIS — G40909 Epilepsy, unspecified, not intractable, without status epilepticus: Secondary | ICD-10-CM

## 2012-01-17 DIAGNOSIS — L8990 Pressure ulcer of unspecified site, unspecified stage: Secondary | ICD-10-CM

## 2012-01-17 DIAGNOSIS — G8250 Quadriplegia, unspecified: Secondary | ICD-10-CM

## 2012-01-17 DIAGNOSIS — D649 Anemia, unspecified: Secondary | ICD-10-CM

## 2012-01-17 DIAGNOSIS — L89109 Pressure ulcer of unspecified part of back, unspecified stage: Secondary | ICD-10-CM | POA: Diagnosis present

## 2012-01-17 DIAGNOSIS — Z936 Other artificial openings of urinary tract status: Secondary | ICD-10-CM

## 2012-01-17 DIAGNOSIS — R6510 Systemic inflammatory response syndrome (SIRS) of non-infectious origin without acute organ dysfunction: Secondary | ICD-10-CM | POA: Diagnosis present

## 2012-01-17 DIAGNOSIS — D72829 Elevated white blood cell count, unspecified: Secondary | ICD-10-CM

## 2012-01-17 DIAGNOSIS — G4733 Obstructive sleep apnea (adult) (pediatric): Secondary | ICD-10-CM

## 2012-01-17 DIAGNOSIS — B965 Pseudomonas (aeruginosa) (mallei) (pseudomallei) as the cause of diseases classified elsewhere: Secondary | ICD-10-CM

## 2012-01-17 DIAGNOSIS — L089 Local infection of the skin and subcutaneous tissue, unspecified: Secondary | ICD-10-CM

## 2012-01-17 DIAGNOSIS — N390 Urinary tract infection, site not specified: Principal | ICD-10-CM

## 2012-01-17 DIAGNOSIS — D509 Iron deficiency anemia, unspecified: Secondary | ICD-10-CM | POA: Diagnosis present

## 2012-01-17 DIAGNOSIS — L8994 Pressure ulcer of unspecified site, stage 4: Secondary | ICD-10-CM

## 2012-01-17 DIAGNOSIS — Z79899 Other long term (current) drug therapy: Secondary | ICD-10-CM

## 2012-01-17 DIAGNOSIS — IMO0002 Reserved for concepts with insufficient information to code with codable children: Secondary | ICD-10-CM

## 2012-01-17 DIAGNOSIS — I739 Peripheral vascular disease, unspecified: Secondary | ICD-10-CM

## 2012-01-17 DIAGNOSIS — Z6834 Body mass index (BMI) 34.0-34.9, adult: Secondary | ICD-10-CM

## 2012-01-17 DIAGNOSIS — Z933 Colostomy status: Secondary | ICD-10-CM

## 2012-01-17 DIAGNOSIS — D638 Anemia in other chronic diseases classified elsewhere: Secondary | ICD-10-CM | POA: Diagnosis present

## 2012-01-17 DIAGNOSIS — L89304 Pressure ulcer of unspecified buttock, stage 4: Secondary | ICD-10-CM

## 2012-01-17 DIAGNOSIS — Z87440 Personal history of urinary (tract) infections: Secondary | ICD-10-CM

## 2012-01-17 DIAGNOSIS — R6520 Severe sepsis without septic shock: Secondary | ICD-10-CM | POA: Diagnosis present

## 2012-01-17 DIAGNOSIS — I10 Essential (primary) hypertension: Secondary | ICD-10-CM

## 2012-01-17 DIAGNOSIS — E662 Morbid (severe) obesity with alveolar hypoventilation: Secondary | ICD-10-CM | POA: Diagnosis present

## 2012-01-17 DIAGNOSIS — M8668 Other chronic osteomyelitis, other site: Secondary | ICD-10-CM | POA: Diagnosis present

## 2012-01-17 DIAGNOSIS — L89309 Pressure ulcer of unspecified buttock, unspecified stage: Secondary | ICD-10-CM

## 2012-01-17 DIAGNOSIS — A498 Other bacterial infections of unspecified site: Secondary | ICD-10-CM

## 2012-01-17 DIAGNOSIS — E871 Hypo-osmolality and hyponatremia: Secondary | ICD-10-CM

## 2012-01-17 LAB — CBC WITH DIFFERENTIAL/PLATELET
HCT: 24 % — ABNORMAL LOW (ref 39.0–52.0)
Hemoglobin: 7.6 g/dL — ABNORMAL LOW (ref 13.0–17.0)
Lymphs Abs: 2.1 10*3/uL (ref 0.7–4.0)
Monocytes Absolute: 2.3 10*3/uL — ABNORMAL HIGH (ref 0.1–1.0)
Monocytes Relative: 13 % — ABNORMAL HIGH (ref 3–12)
Neutro Abs: 13.3 10*3/uL — ABNORMAL HIGH (ref 1.7–7.7)
Neutrophils Relative %: 75 % (ref 43–77)
RBC: 3.36 MIL/uL — ABNORMAL LOW (ref 4.22–5.81)

## 2012-01-17 LAB — URINALYSIS, ROUTINE W REFLEX MICROSCOPIC
Bilirubin Urine: NEGATIVE
Glucose, UA: NEGATIVE mg/dL
Ketones, ur: NEGATIVE mg/dL
Nitrite: POSITIVE — AB
Specific Gravity, Urine: 1.017 (ref 1.005–1.030)
pH: 6 (ref 5.0–8.0)

## 2012-01-17 LAB — CBC
HCT: 25.8 % — ABNORMAL LOW (ref 39.0–52.0)
Hemoglobin: 8.2 g/dL — ABNORMAL LOW (ref 13.0–17.0)
MCH: 22.7 pg — ABNORMAL LOW (ref 26.0–34.0)
MCHC: 31.8 g/dL (ref 30.0–36.0)
MCV: 71.3 fL — ABNORMAL LOW (ref 78.0–100.0)
Platelets: 417 K/uL — ABNORMAL HIGH (ref 150–400)
RBC: 3.62 MIL/uL — ABNORMAL LOW (ref 4.22–5.81)
RDW: 16.4 % — ABNORMAL HIGH (ref 11.5–15.5)
WBC: 22.3 K/uL — ABNORMAL HIGH (ref 4.0–10.5)

## 2012-01-17 LAB — URINE MICROSCOPIC-ADD ON

## 2012-01-17 LAB — FERRITIN: Ferritin: 456 ng/mL — ABNORMAL HIGH (ref 22–322)

## 2012-01-17 LAB — IRON AND TIBC

## 2012-01-17 LAB — BASIC METABOLIC PANEL
BUN: 19 mg/dL (ref 6–23)
Calcium: 9 mg/dL (ref 8.4–10.5)
Creatinine, Ser: 0.55 mg/dL (ref 0.50–1.35)
GFR calc Af Amer: >90 mL/min (ref >90)
GFR calc non Af Amer: >90 mL/min (ref >90)

## 2012-01-17 LAB — CREATININE, SERUM
GFR calc Af Amer: >90 mL/min (ref >90)
GFR calc non Af Amer: >90 mL/min (ref >90)

## 2012-01-17 LAB — RETICULOCYTES: Retic Ct Pct: 0.8 % (ref 0.4–3.1)

## 2012-01-17 LAB — PROTIME-INR
INR: 1.18 (ref 0.00–1.49)
Prothrombin Time: 15.3 s — ABNORMAL HIGH (ref 11.6–15.2)

## 2012-01-17 MED ORDER — SODIUM CHLORIDE 0.9 % IV SOLN
1.0000 g | INTRAVENOUS | Status: AC
  Administered 2012-01-17: 1 g via INTRAVENOUS
  Filled 2012-01-17: qty 1

## 2012-01-17 MED ORDER — ONDANSETRON HCL 4 MG/2ML IJ SOLN: 4.0000 mg | Freq: Four times a day (QID) | INTRAMUSCULAR | Status: DC | PRN

## 2012-01-17 MED ORDER — VITAMIN C 500 MG PO TABS
500.0000 mg | ORAL_TABLET | Freq: Two times a day (BID) | ORAL | Status: DC
  Administered 2012-01-17 – 2012-01-24 (×14): 500 mg via ORAL
  Filled 2012-01-17 (×18): qty 1

## 2012-01-17 MED ORDER — VANCOMYCIN HCL 1000 MG IV SOLR
2000.0000 mg | INTRAVENOUS | Status: AC
  Administered 2012-01-17: 2000 mg via INTRAVENOUS
  Filled 2012-01-17: qty 2000

## 2012-01-17 MED ORDER — ONDANSETRON HCL 4 MG PO TABS: 4.0000 mg | ORAL_TABLET | Freq: Four times a day (QID) | ORAL | Status: DC | PRN

## 2012-01-17 MED ORDER — GUAIFENESIN-DM 100-10 MG/5ML PO SYRP: 5.0000 mL | ORAL_SOLUTION | ORAL | Status: DC | PRN

## 2012-01-17 MED ORDER — FERROUS SULFATE 325 (65 FE) MG PO TABS
325.0000 mg | ORAL_TABLET | Freq: Two times a day (BID) | ORAL | Status: DC
  Administered 2012-01-17 – 2012-01-19 (×4): 325 mg via ORAL
  Filled 2012-01-17 (×7): qty 1

## 2012-01-17 MED ORDER — POTASSIUM CHLORIDE CRYS ER 20 MEQ PO TBCR
20.0000 meq | EXTENDED_RELEASE_TABLET | Freq: Two times a day (BID) | ORAL | Status: DC
  Administered 2012-01-17 – 2012-01-24 (×14): 20 meq via ORAL
  Filled 2012-01-17 (×15): qty 1

## 2012-01-17 MED ORDER — DOCUSATE SODIUM 100 MG PO CAPS
100.0000 mg | ORAL_CAPSULE | Freq: Two times a day (BID) | ORAL | Status: DC
  Administered 2012-01-17 – 2012-01-24 (×14): 100 mg via ORAL
  Filled 2012-01-17 (×15): qty 1

## 2012-01-17 MED ORDER — SODIUM CHLORIDE 0.9 % IJ SOLN
3.0000 mL | Freq: Two times a day (BID) | INTRAMUSCULAR | Status: DC
  Administered 2012-01-17 – 2012-01-19 (×3): 3 mL via INTRAVENOUS

## 2012-01-17 MED ORDER — HYDROCODONE-ACETAMINOPHEN 5-325 MG PO TABS
1.0000 | ORAL_TABLET | ORAL | Status: DC | PRN
  Administered 2012-01-17: 1 via ORAL
  Filled 2012-01-17: qty 1

## 2012-01-17 MED ORDER — ADULT MULTIVITAMIN W/MINERALS CH
1.0000 | ORAL_TABLET | Freq: Every day | ORAL | Status: DC
  Administered 2012-01-18 – 2012-01-24 (×7): 1 via ORAL
  Filled 2012-01-17 (×7): qty 1

## 2012-01-17 MED ORDER — ZINC GLUCONATE 50 MG PO TABS: 50.0000 mg | ORAL_TABLET | Freq: Two times a day (BID) | ORAL | Status: DC

## 2012-01-17 MED ORDER — VANCOMYCIN HCL 1000 MG IV SOLR
1250.0000 mg | Freq: Two times a day (BID) | INTRAVENOUS | Status: DC
  Administered 2012-01-18 – 2012-01-23 (×11): 1250 mg via INTRAVENOUS
  Filled 2012-01-17 (×13): qty 1250

## 2012-01-17 MED ORDER — HEPARIN SODIUM (PORCINE) 5000 UNIT/ML IJ SOLN
5000.0000 [IU] | Freq: Three times a day (TID) | INTRAMUSCULAR | Status: DC
Start: 2012-01-17 — End: 2012-01-24
  Administered 2012-01-17 – 2012-01-24 (×21): 5000 [IU] via SUBCUTANEOUS
  Filled 2012-01-17 (×25): qty 1

## 2012-01-17 MED ORDER — SODIUM CHLORIDE 0.9 % IV SOLN
INTRAVENOUS | Status: AC
  Administered 2012-01-17 – 2012-01-18 (×2): via INTRAVENOUS

## 2012-01-17 MED ORDER — ALBUTEROL SULFATE (5 MG/ML) 0.5% IN NEBU: 2.5000 mg | INHALATION_SOLUTION | RESPIRATORY_TRACT | Status: DC | PRN

## 2012-01-17 MED ORDER — ZINC SULFATE 220 (50 ZN) MG PO CAPS
220.0000 mg | ORAL_CAPSULE | Freq: Every day | ORAL | Status: DC
  Administered 2012-01-18 – 2012-01-24 (×7): 220 mg via ORAL
  Filled 2012-01-17 (×7): qty 1

## 2012-01-17 MED ORDER — FAMOTIDINE 20 MG PO TABS
20.0000 mg | ORAL_TABLET | Freq: Two times a day (BID) | ORAL | Status: DC
  Administered 2012-01-17 – 2012-01-24 (×14): 20 mg via ORAL
  Filled 2012-01-17 (×15): qty 1

## 2012-01-17 MED ORDER — SODIUM CHLORIDE 0.9 % IV SOLN
1.0000 g | Freq: Three times a day (TID) | INTRAVENOUS | Status: DC
  Administered 2012-01-18 – 2012-01-24 (×20): 1 g via INTRAVENOUS
  Filled 2012-01-17 (×21): qty 1

## 2012-01-17 MED ORDER — POLYETHYLENE GLYCOL 3350 17 G PO PACK
17.0000 g | PACK | Freq: Every day | ORAL | Status: DC | PRN
  Filled 2012-01-17: qty 1

## 2012-01-17 MED ORDER — CEFTRIAXONE SODIUM 1 G IJ SOLR: 1.0000 g | Freq: Once | INTRAMUSCULAR | Status: DC

## 2012-01-17 MED ORDER — METOCLOPRAMIDE HCL 10 MG PO TABS
10.0000 mg | ORAL_TABLET | Freq: Two times a day (BID) | ORAL | Status: DC
  Administered 2012-01-18 – 2012-01-24 (×13): 10 mg via ORAL
  Filled 2012-01-17 (×15): qty 1

## 2012-01-17 MED ORDER — ACETAMINOPHEN 325 MG PO TABS
650.0000 mg | ORAL_TABLET | ORAL | Status: DC | PRN
  Administered 2012-01-17 – 2012-01-20 (×4): 650 mg via ORAL
  Filled 2012-01-17 (×4): qty 2

## 2012-01-17 MED ORDER — BACLOFEN 20 MG PO TABS
20.0000 mg | ORAL_TABLET | Freq: Four times a day (QID) | ORAL | Status: DC
  Administered 2012-01-17 – 2012-01-24 (×27): 20 mg via ORAL
  Filled 2012-01-17 (×31): qty 1
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
## 2012-01-17 NOTE — Procedures (Signed)
Central Venous Catheter Insertion Procedure Note [name] [name] [id number] 24-Jan-1966  Procedure: Insertion of Central Venous Catheter Indications: Assessment of intravascular volume, Drug and/or fluid administration, Frequent blood sampling. Asked by Dr Thedore Mins to evaluate patient under his treatment for severe sepsis due to decub ulcers, ? UTI. IV access has not been obtained, and therefore pt has not received meds, labs, IVF's. He needs central access ASAP. PICC team may not be able to place line until tonight. I believe he needs CVC now. Dr Thedore Mins and patient agree.   Procedure Details Consent: Risks of procedure as well as the alternatives and risks of each were explained to the (patient/caregiver).  Consent for procedure obtained. Time Out: Verified patient identification, verified procedure, site/side was marked, verified correct patient position, special equipment/implants available, medications/allergies/relevent history reviewed, required imaging and test results available.  Performed  Maximum sterile technique was used including antiseptics, cap, gloves, gown, hand hygiene, mask and sheet. Skin prep: Chlorhexidine; local anesthetic administered A antimicrobial bonded/coated triple lumen catheter was placed in the right internal jugular vein using the Seldinger technique under real-time US guidance  Evaluation Blood flow good Complications: No apparent complications Patient did tolerate procedure well. Chest X-ray ordered to verify placement.  CXR: pending.  [name] [name] S. 01/17/2012, 6:02 PM
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
## 2012-01-17 NOTE — ED Notes (Signed)
Unsuccessfully attempted to obtain blood for labs.RN Silvio Pate made aware
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
## 2012-01-17 NOTE — ED Notes (Signed)
[id number]<AV> Expected date:<BR> Expected time:<BR> Means of arrival:<BR> Comments:<BR> TCU 28

## 2012-01-17 NOTE — ED Notes (Signed)
Dr. Rubin Payor notified that IV Team was not successful in obtaining an IV access and that the patient states he usually has a PICC line when he is admitted.

## 2012-01-17 NOTE — ED Notes (Signed)
Report called to Monica Rn  

## 2012-01-17 NOTE — ED Notes (Signed)
IV Team notified of need for an IV. Due to failed attempts by 2 different persons and por venous access.
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
## 2012-01-17 NOTE — Progress Notes (Signed)
[facility] ED CM noted CM consult from admission RN. CM spoke with pt who states he does not qualify for home health services because he is not "homebound" Pt reports having a male private personal caregiver and prefers to remain with services he already has.  Provided pt with list of home health agencies for choice if later ordered home health services by attending MD

## 2012-01-18 ENCOUNTER — Inpatient Hospital Stay (HOSPITAL_COMMUNITY): Payer: Medicare Other

## 2012-01-18 DIAGNOSIS — N390 Urinary tract infection, site not specified: Secondary | ICD-10-CM

## 2012-01-18 DIAGNOSIS — D72829 Elevated white blood cell count, unspecified: Secondary | ICD-10-CM

## 2012-01-18 LAB — TSH: TSH: 1.396 u[IU]/mL (ref 0.350–4.500)

## 2012-01-18 LAB — BASIC METABOLIC PANEL
BUN: 14 mg/dL (ref 6–23)
Calcium: 8.9 mg/dL (ref 8.4–10.5)
GFR calc Af Amer: >90 mL/min (ref >90)
GFR calc non Af Amer: >90 mL/min (ref >90)
Glucose, Bld: 102 mg/dL — ABNORMAL HIGH (ref 70–99)
Sodium: 129 mEq/L — ABNORMAL LOW (ref 135–145)

## 2012-01-18 LAB — PREPARE RBC (CROSSMATCH)

## 2012-01-18 LAB — CBC
Platelets: 324 10*3/uL (ref 150–400)
RBC: 3.1 MIL/uL — ABNORMAL LOW (ref 4.22–5.81)
WBC: 15.8 10*3/uL — ABNORMAL HIGH (ref 4.0–10.5)

## 2012-01-18 LAB — OSMOLALITY, URINE: Osmolality, Ur: 391 mOsm/kg (ref 390–1090)

## 2012-01-18 LAB — OSMOLALITY: Osmolality: 273 mOsm/kg — ABNORMAL LOW (ref 275–300)

## 2012-01-18 MED ORDER — SODIUM CHLORIDE 0.9 % IJ SOLN
10.0000 mL | INTRAMUSCULAR | Status: DC | PRN
  Administered 2012-01-19 – 2012-01-24 (×3): 10 mL

## 2012-01-18 MED ORDER — SODIUM CHLORIDE 0.9 % IJ SOLN
10.0000 mL | Freq: Two times a day (BID) | INTRAMUSCULAR | Status: DC
  Administered 2012-01-18 – 2012-01-22 (×5): 10 mL
  Administered 2012-01-22: 20 mL

## 2012-01-18 MED ORDER — SODIUM CHLORIDE 0.9 % IV SOLN
INTRAVENOUS | Status: DC
  Administered 2012-01-19: 02:00:00 via INTRAVENOUS

## 2012-01-18 MED ORDER — IOHEXOL 300 MG/ML  SOLN
100.0000 mL | Freq: Once | INTRAMUSCULAR | Status: AC | PRN
  Administered 2012-01-18: 100 mL via INTRAVENOUS

## 2012-01-18 MED ORDER — COLLAGENASE 250 UNIT/GM EX OINT
TOPICAL_OINTMENT | Freq: Every day | CUTANEOUS | Status: DC
  Administered 2012-01-18: 17:00:00 via TOPICAL
  Administered 2012-01-19: 1 via TOPICAL
  Administered 2012-01-20 – 2012-01-24 (×5): via TOPICAL
  Filled 2012-01-18: qty 30

## 2012-01-18 MED ORDER — UNJURY VANILLA POWDER
8.0000 [oz_av] | Freq: Two times a day (BID) | ORAL | Status: DC
  Administered 2012-01-18 – 2012-01-24 (×13): 8 [oz_av] via ORAL
  Filled 2012-01-18 (×13): qty 27

## 2012-01-18 NOTE — Consult Note (Addendum)
WOC consult Note Reason for Consult:Consult requested for multiple wounds, present on admission. Pt familiar to wound care service from several previous admissions.  Caretaker at bedside states right ischium recently has increased odor and drainage and brown areas.  Pt is followed at the outpatient wound care center by Dr Kelly Splinter. Pt has history of skin flaps in the past, so anatomy is difficult to assess. Pt had CCS consult yesterday with no plans for wound debridement at this time. Wound type: Right ischium stage 4  15X15X2cm, inner wound bed dark brown (10%), strong odor, large amt red-tan drainage. Bone palpable with swab. Tunneling around this area to 2 cm depth under skin. Other area of wound bed beefy red (90%) Sacrum area stage 4 7X5X.5cm, 100% red, large amt yellow-red drainage, no odor. Left ischium stage 4 6X2X.2cm, 100% red, large amt yellow-red drainage, no odor. Left knee full thickness wound, 4X5X.1cm, 20% eschar-slough, 80% red.  Mod tan drainage, no odor. Pressure Ulcer POA: Yes; all wounds listed are present on admission.  Colostomy pouch changed, stoma 11/4inches, red and viable, above skin level, mod semi formed stool in pouch. Dressing procedure/placement/frequency: RECOMMEND CT SCAN TO R/O OSTEOMYELITIS TO RIGHT ISCHIUM R/T EXPOSED BONE.  Santyl for chemical debridement of nonviable tissue to left knee.  Pt was having this applied prior to admission.  Alginate dressing to absorb large amt drainage to sacrum, left ischium, and right ischium wounds.  Air overlay mattress to decrease pressure and float heels.  Pt can resume follow-up with outpatient wound care center after discharge.  Education provided regarding preventive measures for skin; discussed air mattress at home.  Pt has been having difficulty with malfunctioning and needs assistance obtaining a better mattress to reduce pressure.  Using gel cushion to wheelchair, aware of repositioning frequently when in bed and the  wheelchair.  Realizes importance of nutrition in wound healing.   Cammie Mcgee, RN, MSN, [employer]  [phone]
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
## 2012-01-18 NOTE — Progress Notes (Signed)
CRITICAL VALUE ALERT  Critical value received:  Hgb 6.8  Date of notification:  01/18/12  Time of notification:  0555  Critical value read back:yes  Nurse who received alert:  Donzetta Kohut, RN  MD notified (1st page):  Merdis Delay, NP  Time of first page:  0600  MD notified (2nd page):  Time of second page:  Responding MD:  Merdis Delay, NP  Time MD responded:  [phone]
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
## 2012-01-18 NOTE — Care Management Note (Signed)
    Page 1 of 2   01/24/2012     1:39:30 PM   CARE MANAGEMENT NOTE 01/24/2012  Patient:  [name] [name],[name]   Account Number:  [id number]  Date Initiated:  01/18/2012  Documentation initiated by:  Lorenda Ishihara  Subjective/Objective Assessment:   46 yo male admitted with UTI, decubitus, SIRS. PTA lived at home with caregiver, patient is parapalegic.     Action/Plan:   Assist with [facility] services for abx and wound care   Anticipated DC Date:  01/24/2012   Anticipated DC Plan:  HOME W HOME HEALTH SERVICES      DC Planning Services  CM consult      [facility] Choice  HOME HEALTH   Choice offered to / List presented to:  C-1 Patient        HH arranged  HH-1 RN  HH-10 DISEASE MANAGEMENT      HH agency  [facility]   Status of service:  Completed, signed off Medicare Important Message given?   (If response is "NO", the following Medicare IM given date fields will be blank) Date Medicare IM given:   Date Additional Medicare IM given:    Discharge Disposition:  HOME W HOME HEALTH SERVICES  Per UR Regulation:  Reviewed for med. necessity/level of care/duration of stay  If discussed at Long Length of Stay Meetings, dates discussed:    Comments:  01/24/12 Mercie Balsley RN,BSN NCM [phone] [facility] ALREADY FOLLOWING.RESUME DME-02,NEBS,BIPAP-UNLESS SETTINGS HAVE CHANGED THEN WILL NEED ORDER.[facility] WILL BRING TRAVEL 02 TO HOME,FAMILY WILL BRING HOME BIPAP TO HOSPITAL TO MAKE SURE OF ACCURACY-[facility] DME [place] ALREADY AWARE & FOLLOWING.WILL NEED AMBULANCE TRANSP.  01/20/12 Ege Muckey RN,BSN NCM [phone] [facility] FOLLOWING FOR HH-RN IV ABX.WILL NEED AMBULANCE TRANSP @ D/C.INFORMED TO CONTACT [employer] ABOUT AIR MATTRESS CONCERNS.  01-18-12 Lorenda Ishihara RN CM 1100 Spoke with patient at bedside. Discussed plans for d/c, patient does not intend to go to SNF at d/c. Patient states has caregivers at home and wishes to return home at d/c. Notes indicate likely will need long term abx. Patient has  used [facility] in the past and wishes to use them again. Spoke with Baxter Hire at [facility], she will follow patient for d/c needs, awaiting orders.

## 2012-01-18 NOTE — Progress Notes (Signed)
PT NOTE.  Pt is C5 quadriplegic x 23 years, he has a personal caregiver at home; Spoke with nursing and he is total care at baseline;  PT will sign off as pt is not a rehab candidate.  [facility] 01/18/2012, 8:58 AM

## 2012-01-19 ENCOUNTER — Inpatient Hospital Stay (HOSPITAL_COMMUNITY): Payer: Medicare Other

## 2012-01-19 LAB — CBC
HCT: 25.8 % — ABNORMAL LOW (ref 39.0–52.0)
MCH: 23.2 pg — ABNORMAL LOW (ref 26.0–34.0)
MCHC: 31.8 g/dL (ref 30.0–36.0)
RDW: 16.5 % — ABNORMAL HIGH (ref 11.5–15.5)

## 2012-01-19 LAB — BASIC METABOLIC PANEL
BUN: 12 mg/dL (ref 6–23)
Creatinine, Ser: 0.47 mg/dL — ABNORMAL LOW (ref 0.50–1.35)
GFR calc Af Amer: >90 mL/min (ref >90)
GFR calc non Af Amer: >90 mL/min (ref >90)
Glucose, Bld: 138 mg/dL — ABNORMAL HIGH (ref 70–99)

## 2012-01-19 MED ORDER — SODIUM CHLORIDE 0.9 % IV BOLUS (SEPSIS)
500.0000 mL | Freq: Once | INTRAVENOUS | Status: AC
  Administered 2012-01-19: 500 mL via INTRAVENOUS

## 2012-01-19 MED ORDER — FERROUS SULFATE 325 (65 FE) MG PO TABS
325.0000 mg | ORAL_TABLET | Freq: Three times a day (TID) | ORAL | Status: DC
  Administered 2012-01-20 – 2012-01-24 (×12): 325 mg via ORAL
  Filled 2012-01-19 (×16): qty 1

## 2012-01-19 NOTE — Progress Notes (Signed)
Patient ID: [name] [name], male   DOB: June 12, 1965, 46 y.o.   MRN: [id number]    [facility]    Date of Admission:  01/17/2012           Day 3 vancomycin        Day 3 meropenem Principal Problem:  *Infected decubitus ulcer Active Problems:  Severe sepsis  OSA (obstructive sleep apnea)  UTI (lower urinary tract infection)  Obesity  Chronic anemia  Quadriplegia  Urinary tract infection  Hyponatremia  Leukocytosis  Seizure disorder  HTN (hypertension)      . baclofen  20 mg Oral QID  . collagenase   Topical Daily  . docusate sodium  100 mg Oral BID  . famotidine  20 mg Oral BID  . ferrous sulfate  325 mg Oral BID  . heparin  5,000 Units Subcutaneous Q8H  . meropenem (MERREM) IV  1 g Intravenous Q8H  . metoCLOPramide  10 mg Oral BID WC  . multivitamin with minerals  1 tablet Oral Daily  . potassium chloride SA  20 mEq Oral BID  . protein supplement  8 oz Oral BID BM  . sodium chloride  500 mL Intravenous Once  . sodium chloride  10-40 mL Intracatheter Q12H  . sodium chloride  3 mL Intravenous Q12H  . vancomycin  1,250 mg Intravenous Q12H  . vitamin C  500 mg Oral BID  . zinc sulfate  220 mg Oral Daily    Subjective: Still having sweats.  Objective: Temp:  [98.2 F (36.8 C)-102.7 F (39.3 C)] 99.6 F (37.6 C) (09/05 1437) Pulse Rate:  [76-102] 96  (09/05 1437) Resp:  [18-20] 18  (09/05 1437) BP: (87-119)/(59-70) 119/70 mmHg (09/05 1437) SpO2:  [95 %-99 %] 95 % (09/05 1437) Weight:  [250 lb (113.4 kg)] 250 lb (113.4 kg) (09/05 0500)  General: He is alert in no distress working on his computer They is still a foul odor to his wounds A new suprapubic catheter was placed today  Lab Results Lab Results  Component Value Date   WBC 19.6* 01/19/2012   HGB 8.2* 01/19/2012   HCT 25.8* 01/19/2012   MCV 72.9* 01/19/2012   PLT 396 01/19/2012    Lab Results  Component Value Date   CREATININE 0.47* 01/19/2012   BUN 12 01/19/2012   NA 136 01/19/2012   K 3.9  01/19/2012   CL 104 01/19/2012   CO2 23 01/19/2012    Lab Results  Component Value Date   ALT 15 02/25/2011   AST 13 02/25/2011   ALKPHOS 99 02/25/2011   BILITOT 0.1* 02/25/2011      Microbiology: Recent Results (from the past 240 hour(s))  URINE CULTURE     Status: Normal (Preliminary result)   Collection Time   01/17/12  1:23 PM      Component Value Range Status Comment   Specimen Description URINE, CATHETERIZED   Final    Special Requests NONE   Final    Culture  Setup Time 01/18/2012 00:54   Final    Colony Count 20,OOO COLONIES/ML   Final    Culture ESCHERICHIA COLI   Final    Report Status PENDING   Incomplete   CULTURE, BLOOD (ROUTINE X 2)     Status: Normal (Preliminary result)   Collection Time   01/17/12  5:05 PM      Component Value Range Status Comment   Specimen Description BLOOD LEFT HAND   Final    Special  Requests BOTTLES DRAWN AEROBIC AND ANAEROBIC 4CC   Final    Culture  Setup Time 01/17/2012 20:40   Final    Culture     Final    Value:        BLOOD CULTURE RECEIVED NO GROWTH TO DATE CULTURE WILL BE HELD FOR 5 DAYS BEFORE ISSUING A FINAL NEGATIVE REPORT   Report Status PENDING   Incomplete   CULTURE, BLOOD (ROUTINE X 2)     Status: Normal (Preliminary result)   Collection Time   01/17/12  5:20 PM      Component Value Range Status Comment   Specimen Description BLOOD RIGHT HAND   Final    Special Requests BOTTLES DRAWN AEROBIC ONLY 5CC   Final    Culture  Setup Time 01/17/2012 20:40   Final    Culture     Final    Value:        BLOOD CULTURE RECEIVED NO GROWTH TO DATE CULTURE WILL BE HELD FOR 5 DAYS BEFORE ISSUING A FINAL NEGATIVE REPORT   Report Status PENDING   Incomplete   MRSA PCR SCREENING     Status: Normal   Collection Time   01/17/12 11:14 PM      Component Value Range Status Comment   MRSA by PCR NEGATIVE  NEGATIVE Final     Studies/Results: Ct Pelvis W Contrast  01/18/2012  *RADIOLOGY REPORT*  Clinical Data:  Multiple sacral decubitus ulcers with  infection. Evaluate for undrained abscess or evidence of osteomyelitis  CT PELVIS WITH CONTRAST  Technique:  Multidetector CT imaging of the pelvis was performed using the standard protocol following the bolus administration of intravenous contrast.  Contrast: OMNIPAQUE IOHEXOL 300 MG/ML  SOLN  Comparison:   None.  Findings:  There is prominent fascial laxity of the lower abdominal wall.  Incompletely visualized, a suprapubic tube is visualized in situ.  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  Borderline lymphadenopathy is seen in long both pelvic sidewalls.  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 A posterior decubitus ulcer on the right tracks deep to the level of the dysmorphic right femoral head.  The gas filled tract can be followed to within 2 mm of the cortical bone which appears to have some cortical thickening and irregularity at this level.  As such, imaging features must be considered suspicious for osteomyelitis.  There is a 3.2 x 3.6 cm fluid collection inferomedial to the dysmorphic right femoral head.  Multiple bone/ossific fragments are seen in the same region.  There is skin irregularity and thinning of the subcutaneous fat overlying both ischial tuberosities.  Overall, there is prominent sclerosis of both ischial tuberosities and this sclerotic change extends up into the innominate bone of the right hip.  There is no evidence for focal fluid collection in the region of either ischial tuberosity or the left hip.  IMPRESSION: Prominent sclerotic change in both ischial tuberosities deep to soft tissue changes consistent with decubitus ulcers. While bony mineralization in the anatomic pelvis is diffusely heterogeneous, this level of sclerosis would be consistent with age indeterminate bony infection.  The dysmorphic, chronically dislocated right hip has a gas filled tract which extends from the posterior skin deep to the level  of the dysmorphic femoral head.  The gas can be seen tracking to within 2 mm of the cortical bone which shows some underlying focal sclerosis and irregularity.  Bony infection at this level  is considered likely.  3.2 x 3.6 cm low attenuating apparent fluid collection is seen just inferomedial to the dysmorphic right femoral head. Although there is no rim enhancement with gas within the collection, superinfection is not excluded by imaging.   Original Report Authenticated By: ERIC A. MANSELL, M.D.    Dg Lumbar Spine 1 View  01/17/2012  *RADIOLOGY REPORT*  Clinical Data: Decubitus ulcer.  LUMBAR SPINE - 1 VIEW  Comparison: 12/20/2011.  Findings: Prominent amount of stool projects over the pelvis.  This limits detection of osseous injury.  The full extent of the sacrum is not included on present examination.  If clinically desired, CT imaging or MR would prove helpful.  Destruction of the right hip seen at the edge of the field of view.  IMPRESSION: Limited evaluation for detection of a sacral decubitus ulcer and associated osteomyelitis.  CT or MR may be considered for further delineation.   Original Report Authenticated By: Fuller Canada, M.D.    US Renal  01/19/2012  *RADIOLOGY REPORT*  Clinical Data: Chronic cystitis.  Neurogenic bladder.  RENAL/URINARY TRACT ULTRASOUND COMPLETE  Comparison:  CT pelvis 01/18/2012.  Findings:  Right Kidney:  Measures 11.5 cm and appears normal without stone, mass or hydronephrosis.  Left Kidney:  Measures 13.9 cm and appears normal without stone, mass or hydronephrosis.  Bladder:  Completely decompressed with a suprapubic catheter in place.  IMPRESSION: Normal-appearing kidneys.  Suprapubic catheter noted.   Original Report Authenticated By: Bernadene Bell. D'ALESSIO, M.D.    Dg Chest Portable 1 View  01/17/2012  *RADIOLOGY REPORT*  Clinical Data: Central line placement.  PORTABLE CHEST - 1 VIEW  Comparison: 01/17/2012 chest x-ray.  10/26/2010 CT.  Findings: Right central line tip  projects at the level of the proximal superior vena cava.  No gross pneumothorax.  Right upper lobe ill-defined lesion.  This may represent residua from abnormality noted on prior CT.  Follow-up two-view chest or chest CT will be necessary for further delineation to exclude malignancy.  Cardiomegaly.  Pulmonary vascular prominence.  Scoliosis.  Prior left rib fracture.  IMPRESSION: Right central line tip projects at the level of the proximal superior vena cava.  No gross pneumothorax.  Right upper lobe ill-defined lesion.  This may represent residua from abnormality noted on prior CT.  Follow-up two-view chest or chest CT will be necessary for further delineation   Original Report Authenticated By: Fuller Canada, M.D.    Dg Knee Left Port  01/17/2012  *RADIOLOGY REPORT*  Clinical Data: Left knee wound  PORTABLE LEFT KNEE - 1-2 VIEW  Comparison: None.  Findings: There is severe joint space narrowing of the medial and lateral compartments with essentially bone on bone positioning. There is severe osteophytosis of the medial and lateral compartment.  There is no significant suprapatellar joint effusion. No subcutaneous gas.  No clear evidence of osseous erosion.  IMPRESSION:  1.  Severe joint space narrowing of the medial and lateral compartments suggests severe osteoarthritis. 2.  No clear evidence of bone infection or soft tissue infection.   Original Report Authenticated By: Genevive Bi, M.D.     Assessment: He probably has a mixed aerobic anaerobic infection of his right ischial wound tracking down to the right femoral head. The CAT scan suggests that he probably has osteomyelitis. I recommend that we continue IV vancomycin through a PICC. I will select broader coverage for anaerobic gram-negative rods and anaerobes tomorrow once I know what his urine culture results show. Although I do  not feel that he has a symptomatic urinary tract infection right now that we'll at least tell me what he is colonized  with.  Plan: 1. Continue current antibiotics for now 2. PICC placement  Cliffton Asters, MD [facility] [phone] pager   [phone] cell 01/19/2012, 4:17 PM

## 2012-01-19 NOTE — Progress Notes (Signed)
Patient ID: [name] [name], male   DOB: Dec 24, 1965, 46 y.o.   MRN: [id number]   Recent history and physical and progress notes reviewed. [name] is followed in our office due to his neurogenic bladder with chronic suprapubic tube. He does feel better this morning. He was due for suprapubic tube change and this was done this morning in a typical manner. 24 French suprapubic tube are placed without difficulty. The patient is due for his usual annual renal ultrasound that will be ordered for him as an inpatient to eliminate the need to come back for office the near future.

## 2012-01-19 NOTE — Consult Note (Signed)
Wound care follow-up:  CT scan indicates gas-filled tract to bone on right ischium pressure ulcer.  This is beyond [facility] scope of practice.  Will defer to [facility] team for further plan of care. Will not plan to follow further unless re-consulted.  [address], RN, MSN, [employer]  [phone]

## 2012-01-19 NOTE — Progress Notes (Signed)
Subjective: Fevers. Pt just back from u/s renal. No complaints  Objective: Vital signs in last 24 hours: Temp:  [98.2 F (36.8 C)-102.7 F (39.3 C)] 99.6 F (37.6 C) (09/05 1437) Pulse Rate:  [76-102] 96  (09/05 1437) Resp:  [18-20] 18  (09/05 1437) BP: (87-119)/(59-70) 119/70 mmHg (09/05 1437) SpO2:  [95 %-99 %] 95 % (09/05 1437) Weight:  [250 lb (113.4 kg)] 250 lb (113.4 kg) (09/05 0500) Last BM Date: 01/19/12  Intake/Output from previous day: 09/04 0701 - 09/05 0700 In: 3595.3 [P.O.:1320; I.V.:1147.5; Blood:327.8; IV Piggyback:800] Out: 3375 [Urine:3375] Intake/Output this shift: Total I/O In: 2227.5 [P.O.:1200; I.V.:177.5; IV Piggyback:850] Out: 1275 [Urine:1275]  Pt seen and examined.  Rt buttock/ishium - stage iv decub. No necrotic tissue. Some Pseudomonas type odor.  Left side - no necrotic tissue  Lab Results:   [facility] 01/19/12 0610 01/18/12 0450  WBC 19.6* 15.8*  HGB 8.2* 6.8*  HCT 25.8* 22.4*  PLT 396 324   BMET  Basename 01/19/12 0610 01/18/12 0450  NA 136 129*  K 3.9 3.7  CL 104 97  CO2 23 23  GLUCOSE 138* 102*  BUN 12 14  CREATININE 0.47* 0.46*  CALCIUM 8.7 8.9   PT/INR  Basename 01/17/12 1837  LABPROT 15.3*  INR 1.18   ABG No results found for this basename: PHART:2,PCO2:2,PO2:2,HCO3:2 in the last 72 hours  Studies/Results: Ct Pelvis W Contrast  01/18/2012  *RADIOLOGY REPORT*  Clinical Data:  Multiple sacral decubitus ulcers with infection. Evaluate for undrained abscess or evidence of osteomyelitis  CT PELVIS WITH CONTRAST  Technique:  Multidetector CT imaging of the pelvis was performed using the standard protocol following the bolus administration of intravenous contrast.  Contrast: OMNIPAQUE IOHEXOL 300 MG/ML  SOLN  Comparison:   None.  Findings:  There is prominent fascial laxity of the lower abdominal wall.  Incompletely visualized, a suprapubic tube is visualized in situ.  The patient has a sigmoid end colostomy with a long  Hartmann's pouch.  Borderline lymphadenopathy is seen in long both pelvic sidewalls.  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 A posterior decubitus ulcer on the right tracks deep to the level of the dysmorphic right femoral head.  The gas filled tract can be followed to within 2 mm of the cortical bone which appears to have some cortical thickening and irregularity at this level.  As such, imaging features must be considered suspicious for osteomyelitis.  There is a 3.2 x 3.6 cm fluid collection inferomedial to the dysmorphic right femoral head.  Multiple bone/ossific fragments are seen in the same region.  There is skin irregularity and thinning of the subcutaneous fat overlying both ischial tuberosities.  Overall, there is prominent sclerosis of both ischial tuberosities and this sclerotic change extends up into the innominate bone of the right hip.  There is no evidence for focal fluid collection in the region of either ischial tuberosity or the left hip.  IMPRESSION: Prominent sclerotic change in both ischial tuberosities deep to soft tissue changes consistent with decubitus ulcers. While bony mineralization in the anatomic pelvis is diffusely heterogeneous, this level of sclerosis would be consistent with age indeterminate bony infection.  The dysmorphic, chronically dislocated right hip has a gas filled tract which extends from the posterior skin deep to the level of the dysmorphic femoral head.  The gas can be seen tracking to within 2 mm of the cortical bone which shows some underlying focal sclerosis and irregularity.  Bony infection  at this level is considered likely.  3.2 x 3.6 cm low attenuating apparent fluid collection is seen just inferomedial to the dysmorphic right femoral head. Although there is no rim enhancement with gas within the collection, superinfection is not excluded by imaging.   Original Report Authenticated By: Kailyn Dubie A. MANSELL, M.D.    Dg Lumbar  Spine 1 View  01/17/2012  *RADIOLOGY REPORT*  Clinical Data: Decubitus ulcer.  LUMBAR SPINE - 1 VIEW  Comparison: 12/20/2011.  Findings: Prominent amount of stool projects over the pelvis.  This limits detection of osseous injury.  The full extent of the sacrum is not included on present examination.  If clinically desired, CT imaging or MR would prove helpful.  Destruction of the right hip seen at the edge of the field of view.  IMPRESSION: Limited evaluation for detection of a sacral decubitus ulcer and associated osteomyelitis.  CT or MR may be considered for further delineation.   Original Report Authenticated By: Fuller Canada, M.D.    US Renal  01/19/2012  *RADIOLOGY REPORT*  Clinical Data: Chronic cystitis.  Neurogenic bladder.  RENAL/URINARY TRACT ULTRASOUND COMPLETE  Comparison:  CT pelvis 01/18/2012.  Findings:  Right Kidney:  Measures 11.5 cm and appears normal without stone, mass or hydronephrosis.  Left Kidney:  Measures 13.9 cm and appears normal without stone, mass or hydronephrosis.  Bladder:  Completely decompressed with a suprapubic catheter in place.  IMPRESSION: Normal-appearing kidneys.  Suprapubic catheter noted.   Original Report Authenticated By: Bernadene Bell. D'ALESSIO, M.D.    Dg Chest Portable 1 View  01/17/2012  *RADIOLOGY REPORT*  Clinical Data: Central line placement.  PORTABLE CHEST - 1 VIEW  Comparison: 01/17/2012 chest x-ray.  10/26/2010 CT.  Findings: Right central line tip projects at the level of the proximal superior vena cava.  No gross pneumothorax.  Right upper lobe ill-defined lesion.  This may represent residua from abnormality noted on prior CT.  Follow-up two-view chest or chest CT will be necessary for further delineation to exclude malignancy.  Cardiomegaly.  Pulmonary vascular prominence.  Scoliosis.  Prior left rib fracture.  IMPRESSION: Right central line tip projects at the level of the proximal superior vena cava.  No gross pneumothorax.  Right upper lobe  ill-defined lesion.  This may represent residua from abnormality noted on prior CT.  Follow-up two-view chest or chest CT will be necessary for further delineation   Original Report Authenticated By: Fuller Canada, M.D.    Dg Chest Port 1 View  01/17/2012  *RADIOLOGY REPORT*  Clinical Data: Shortness of breath.  Nonsmoker.  PORTABLE CHEST - 1 VIEW  Comparison: 09/11/2011.  Findings: Parenchymal changes right upper lobe without significant change.  It is possible this reflects scarring from remote inflammatory process (10/26/2010 CT).  This can be assessed on follow-up two-view chest when the patient is able.  Cardiomegaly.  Central pulmonary vascular prominence.  Small pleural effusions.  No gross pneumothorax.  IMPRESSION: Cardiomegaly with pulmonary vascular prominence and small pleural effusions.  Parenchymal changes right upper lobe with follow up as discussed above.   Original Report Authenticated By: Fuller Canada, M.D.    Dg Knee Left Port  01/17/2012  *RADIOLOGY REPORT*  Clinical Data: Left knee wound  PORTABLE LEFT KNEE - 1-2 VIEW  Comparison: None.  Findings: There is severe joint space narrowing of the medial and lateral compartments with essentially bone on bone positioning. There is severe osteophytosis of the medial and lateral compartment.  There is no significant suprapatellar  joint effusion. No subcutaneous gas.  No clear evidence of osseous erosion.  IMPRESSION:  1.  Severe joint space narrowing of the medial and lateral compartments suggests severe osteoarthritis. 2.  No clear evidence of bone infection or soft tissue infection.   Original Report Authenticated By: Genevive Bi, M.D.     Anti-infectives: Anti-infectives     Start     Dose/Rate Route Frequency Ordered Stop   01/18/12 0900   vancomycin (VANCOCIN) 1,250 mg in sodium chloride 0.9 % 250 mL IVPB        1,250 mg 166.7 mL/hr over 90 Minutes Intravenous Every 12 hours 01/17/12 1820     01/18/12 0400   meropenem (MERREM)  1 g in sodium chloride 0.9 % 100 mL IVPB        1 g 200 mL/hr over 30 Minutes Intravenous Every 8 hours 01/17/12 1820     01/17/12 1900   vancomycin (VANCOCIN) 2,000 mg in sodium chloride 0.9 % 500 mL IVPB        2,000 mg 250 mL/hr over 120 Minutes Intravenous To Emergency Dept 01/17/12 1717 01/17/12 2233   01/17/12 1900   meropenem (MERREM) 1 g in sodium chloride 0.9 % 100 mL IVPB        1 g 200 mL/hr over 30 Minutes Intravenous To Emergency Dept 01/17/12 1717 01/17/12 2006   01/17/12 1545   cefTRIAXone (ROCEPHIN) 1 g in dextrose 5 % 50 mL IVPB  Status:  Discontinued        1 g 100 mL/hr over 30 Minutes Intravenous  Once 01/17/12 1543 01/17/12 1617          Assessment/Plan: Stage IV chronic decub ulcers No gross necrotic tissue. See no need for general surgery debridement However CT pelvis suggested osteomyelitis of right femoral head. Would rec Ortho consult  We have nothing to offer this pt. He is already diverted Cont local wound care Pt is followed by Dr Kelly Splinter in the [facility] wound care center   Signing off. Please call with questions.  Mary Sella. Andrey Campanile, MD, FACS General, Bariatric, & Minimally Invasive Surgery [facility], [place]   [facility] Judie Petit 01/19/2012
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
## 2012-01-20 DIAGNOSIS — N390 Urinary tract infection, site not specified: Secondary | ICD-10-CM

## 2012-01-20 LAB — CBC
MCH: 23 pg — ABNORMAL LOW (ref 26.0–34.0)
MCV: 73.3 fL — ABNORMAL LOW (ref 78.0–100.0)
Platelets: 390 10*3/uL (ref 150–400)
RBC: 3.44 MIL/uL — ABNORMAL LOW (ref 4.22–5.81)
RDW: 16.9 % — ABNORMAL HIGH (ref 11.5–15.5)
WBC: 21.2 10*3/uL — ABNORMAL HIGH (ref 4.0–10.5)

## 2012-01-20 LAB — BASIC METABOLIC PANEL
CO2: 23 mEq/L (ref 19–32)
Calcium: 8.8 mg/dL (ref 8.4–10.5)
Creatinine, Ser: 0.41 mg/dL — ABNORMAL LOW (ref 0.50–1.35)
GFR calc Af Amer: >90 mL/min (ref >90)
Sodium: 135 mEq/L (ref 135–145)

## 2012-01-20 LAB — VANCOMYCIN, TROUGH: Vancomycin Tr: 14 ug/mL (ref 10.0–20.0)

## 2012-01-20 LAB — URINE CULTURE

## 2012-01-20 NOTE — Progress Notes (Signed)
Peripherally Inserted Central Catheter/Midline Placement  The IV Nurse has discussed with the patient and/or persons authorized to consent for the patient, the purpose of this procedure and the potential benefits and risks involved with this procedure.  The benefits include less needle sticks, lab draws from the catheter and patient may be discharged home with the catheter.  Risks include, but not limited to, infection, bleeding, blood clot (thrombus formation), and puncture of an artery; nerve damage and irregular heat beat.  Alternatives to this procedure were also discussed.  PICC/Midline Placement Documentation  PICC / Midline Single Lumen 01/20/12 PICC Left Basilic (Active)       Stacie Glaze Horton 01/20/2012, 9:46 AM
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
## 2012-01-20 NOTE — Progress Notes (Signed)
Patient ID: [name] [name], male   DOB: May 22, 1965, 46 y.o.   MRN: [id number]    [facility]    Date of Admission:  01/17/2012           Day 4 vancomycin         Day 4 meropenem Principal Problem:  *Infected decubitus ulcer Active Problems:  Severe sepsis  OSA (obstructive sleep apnea)  UTI (lower urinary tract infection)  Obesity  Chronic anemia  Quadriplegia  Urinary tract infection  Hyponatremia  Leukocytosis  Seizure disorder  HTN (hypertension)      . baclofen  20 mg Oral QID  . collagenase   Topical Daily  . docusate sodium  100 mg Oral BID  . famotidine  20 mg Oral BID  . ferrous sulfate  325 mg Oral TID WC  . heparin  5,000 Units Subcutaneous Q8H  . meropenem (MERREM) IV  1 g Intravenous Q8H  . metoCLOPramide  10 mg Oral BID WC  . multivitamin with minerals  1 tablet Oral Daily  . potassium chloride SA  20 mEq Oral BID  . protein supplement  8 oz Oral BID BM  . sodium chloride  10-40 mL Intracatheter Q12H  . sodium chloride  3 mL Intravenous Q12H  . vancomycin  1,250 mg Intravenous Q12H  . vitamin C  500 mg Oral BID  . zinc sulfate  220 mg Oral Daily  . DISCONTD: ferrous sulfate  325 mg Oral BID    Subjective:  he had one chill last night but otherwise states she's feeling fine. He denies any cough, itching, or increased colostomy output.  Objective: Temp:  [97.1 F (36.2 C)-102.1 F (38.9 C)] 97.1 F (36.2 C) (09/06 0555) Pulse Rate:  [76-97] 87  (09/06 0555) Resp:  [18] 18  (09/06 0555) BP: (97-129)/(64-82) 98/64 mmHg (09/06 0555) SpO2:  [95 %-99 %] 98 % (09/06 0555) Weight:  [114.8 kg (253 lb 1.4 oz)] 114.8 kg (253 lb 1.4 oz) (09/06 0500)  General:  he is in no distress working on his computer Skin:  no rash. New left arm PICC Lungs:  clear Cor:  regular S1 and S2 and no murmurs Abdomen:  soft sacral wounds not reexamined  Lab Results Lab Results  Component Value Date   WBC 21.2* 01/20/2012   HGB 7.9* 01/20/2012   HCT  25.2* 01/20/2012   MCV 73.3* 01/20/2012   PLT 390 01/20/2012    Lab Results  Component Value Date   CREATININE 0.41* 01/20/2012   BUN 11 01/20/2012   NA 135 01/20/2012   K 3.7 01/20/2012   CL 103 01/20/2012   CO2 23 01/20/2012    Lab Results  Component Value Date   ALT 15 02/25/2011   AST 13 02/25/2011   ALKPHOS 99 02/25/2011   BILITOT 0.1* 02/25/2011      Microbiology: Recent Results (from the past 240 hour(s))  URINE CULTURE     Status: Normal   Collection Time   01/17/12  1:23 PM      Component Value Range Status Comment   Specimen Description URINE, CATHETERIZED   Final    Special Requests NONE   Final    Culture  Setup Time 01/18/2012 00:54   Final    Colony Count 20,OOO COLONIES/ML   Final    Culture ESCHERICHIA COLI   Final    Report Status 01/20/2012 FINAL   Final    Organism ID, Bacteria ESCHERICHIA COLI   Final  CULTURE, BLOOD (ROUTINE X 2)     Status: Normal (Preliminary result)   Collection Time   01/17/12  5:05 PM      Component Value Range Status Comment   Specimen Description BLOOD LEFT HAND   Final    Special Requests BOTTLES DRAWN AEROBIC AND ANAEROBIC 4CC   Final    Culture  Setup Time 01/17/2012 20:40   Final    Culture     Final    Value:        BLOOD CULTURE RECEIVED NO GROWTH TO DATE CULTURE WILL BE HELD FOR 5 DAYS BEFORE ISSUING A FINAL NEGATIVE REPORT   Report Status PENDING   Incomplete   CULTURE, BLOOD (ROUTINE X 2)     Status: Normal (Preliminary result)   Collection Time   01/17/12  5:20 PM      Component Value Range Status Comment   Specimen Description BLOOD RIGHT HAND   Final    Special Requests BOTTLES DRAWN AEROBIC ONLY 5CC   Final    Culture  Setup Time 01/17/2012 20:40   Final    Culture     Final    Value:        BLOOD CULTURE RECEIVED NO GROWTH TO DATE CULTURE WILL BE HELD FOR 5 DAYS BEFORE ISSUING A FINAL NEGATIVE REPORT   Report Status PENDING   Incomplete   MRSA PCR SCREENING     Status: Normal   Collection Time   01/17/12 11:14 PM       Component Value Range Status Comment   MRSA by PCR NEGATIVE  NEGATIVE Final     Studies/Results: Ct Pelvis W Contrast  01/18/2012  *RADIOLOGY REPORT*  Clinical Data:  Multiple sacral decubitus ulcers with infection. Evaluate for undrained abscess or evidence of osteomyelitis  CT PELVIS WITH CONTRAST  Technique:  Multidetector CT imaging of the pelvis was performed using the standard protocol following the bolus administration of intravenous contrast.  Contrast: OMNIPAQUE IOHEXOL 300 MG/ML  SOLN  Comparison:   None.  Findings:  There is prominent fascial laxity of the lower abdominal wall.  Incompletely visualized, a suprapubic tube is visualized in situ.  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  Borderline lymphadenopathy is seen in long both pelvic sidewalls.  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 A posterior decubitus ulcer on the right tracks deep to the level of the dysmorphic right femoral head.  The gas filled tract can be followed to within 2 mm of the cortical bone which appears to have some cortical thickening and irregularity at this level.  As such, imaging features must be considered suspicious for osteomyelitis.  There is a 3.2 x 3.6 cm fluid collection inferomedial to the dysmorphic right femoral head.  Multiple bone/ossific fragments are seen in the same region.  There is skin irregularity and thinning of the subcutaneous fat overlying both ischial tuberosities.  Overall, there is prominent sclerosis of both ischial tuberosities and this sclerotic change extends up into the innominate bone of the right hip.  There is no evidence for focal fluid collection in the region of either ischial tuberosity or the left hip.  IMPRESSION: Prominent sclerotic change in both ischial tuberosities deep to soft tissue changes consistent with decubitus ulcers. While bony mineralization in the anatomic pelvis is diffusely heterogeneous,  this level of sclerosis would be consistent with age indeterminate bony infection.  The dysmorphic, chronically dislocated right hip has a  gas filled tract which extends from the posterior skin deep to the level of the dysmorphic femoral head.  The gas can be seen tracking to within 2 mm of the cortical bone which shows some underlying focal sclerosis and irregularity.  Bony infection at this level is considered likely.  3.2 x 3.6 cm low attenuating apparent fluid collection is seen just inferomedial to the dysmorphic right femoral head. Although there is no rim enhancement with gas within the collection, superinfection is not excluded by imaging.   Original Report Authenticated By: ERIC A. MANSELL, M.D.    US Renal  01/19/2012  *RADIOLOGY REPORT*  Clinical Data: Chronic cystitis.  Neurogenic bladder.  RENAL/URINARY TRACT ULTRASOUND COMPLETE  Comparison:  CT pelvis 01/18/2012.  Findings:  Right Kidney:  Measures 11.5 cm and appears normal without stone, mass or hydronephrosis.  Left Kidney:  Measures 13.9 cm and appears normal without stone, mass or hydronephrosis.  Bladder:  Completely decompressed with a suprapubic catheter in place.  IMPRESSION: Normal-appearing kidneys.  Suprapubic catheter noted.   Original Report Authenticated By: Bernadene Bell. Maricela Curet, M.D.     Assessment: He is clinically improved but continues to have fever, chills and leukocytosis for uncertain reasons. I will continue his current antibiotic therapy for now and have my partner, Dr. Judyann Munson, followup this weekend.  Plan: 1. Continue vancomycin and meropenem 2. Check final culture results  Cliffton Asters, MD [facility] [phone] pager   [phone] cell 01/20/2012, 11:11 AM

## 2012-01-20 NOTE — Progress Notes (Signed)
[facility] PROGRESS NOTE  [name] [name] ZOX:[id number] DOB: 08/15/1965 DOA: 01/17/2012 PCP: [name], MD  Assessment/Plan: Principal Problem:  *Infected decubitus ulcer Active Problems:  OSA (obstructive sleep apnea)  UTI (lower urinary tract infection)  Obesity  Chronic anemia  Quadriplegia  Urinary tract infection  Hyponatremia  Leukocytosis  Seizure disorder  HTN (hypertension)  Severe sepsis  1. Leukocytosis - At this point General surgery and ID involved.  I will follow up with their recommendations. - Will continue IV antibiotics as indicated by Dr. Orvan Falconer (ID). Pt will need prolonged antibiotic therapy and as such Picc line will be placed. - Follow up with Gen Surgery's recommendations regarding sacral stage IV decubitus ulcer. - CT of pelvis reviewed.  2. UTI - Patient has chronic suprapubic cetheter in place.  Will continue antibiotic regimen. - Preliminary results shows E coli in urine culture. Sensitive to imipenem.  Continue current regimen as organism is resistant to bactrim.  ID recs to follow   3. Sacral decubitus - Per ID/Gen surge likely infectious source.  Wound care nurse consulted. - CT of pelvis evaluated - IV abx's Vancomycin and meropenem for now.  4. Obstructive sleep apnea  -patient is CPAP at night which will be continued.   5. Anemia of chronic disease and iron deficiency -hemoglobin is low, anemia panel has been ordered -Pt is currently getting transfused for hgb less than 7.  No active bleeding.  Will f/u with post h/h transfusion which is currently at 8.2 - Recheck hemoglobin tomorrow. - Looks like patient has iron deficiency anemia.   - Started ferrous sulfate tid - Monitor h/h next am.  6. Hyponatremia:  - Most likely secondary to low oral solute intake.  Slightly improved today.   -Will recheck tomorrow in am. - Has resolved with IVF rehydration.  I have discontinued and will saline lock patient.  He was having lower  blood pressures this am of 87/61 which responded well to IVF rehydration.   - Place on regular diet and should improved with improved po intake.  DVT Prophylaxis Heparin    Code Status: full Family Communication: No family at bedside Disposition Plan: Pending clinical improvement   Brief narrative: From Dr. Blair Dolphin note [name] [name] is a 46 y.o. 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 He was readmitted yesterday after noting increased, foul-smelling drainage from his sacral wound for the last 3 weeks and a 24-hour period of feeling extremely warm and thirsty. He was found to be febrile and hypotensive upon admission. He is feeling much better today. He states that there've not been any changes to his wound care regimen recently. He has been followed by Dr. Shella Spearing at the [facility] but was last seen there 6 weeks ago. He states that his caregiver has been doing dressing changes on his wounds daily.  His suprapubic catheter is changed every 2 months. He has not had any problems with it becoming clogged or leaking. He states that he feels differently this time and when he has been admitted with urinary tract infections in the past. He has not been on any recent antibiotic therapy.   Consultants:  ID: Dr. Orvan Falconer  Dr. Johna Sheriff (gen surg)  Procedures:  CT of pelvis  Antibiotics:  Vancomycin and meropenem  HPI/Subjective: Patient has no new complaints today.  No acute issues reported overnight.  Objective: Filed Vitals:  01/20/12 0256 01/20/12 0500 01/20/12 0555 01/20/12 1500  BP: 129/82  98/64 116/61  Pulse:   87 103  Temp:   97.1 F (36.2 C) 100.6 F (38.1 C)  TempSrc:   Oral Oral  Resp:   18 18  Height:      Weight:  114.8 kg (253 lb 1.4 oz)    SpO2:   98% 97%    Intake/Output Summary (Last 24 hours) at 01/20/12 1847 Last  data filed at 01/20/12 1500  Gross per 24 hour  Intake   2240 ml  Output   4225 ml  Net  -1985 ml   Filed Weights   01/18/12 0500 01/19/12 0500 01/20/12 0500  Weight: 110.8 kg (244 lb 4.3 oz) 113.4 kg (250 lb) 114.8 kg (253 lb 1.4 oz)    Exam:   General:  Pt in NAD, Alert and Awake  Cardiovascular: RRR, No MRG  Respiratory: CTA BL, No wheezes anteriorly  Abdomen: Soft, NT, ND  Neuro: Patient has muscle wasting in upper and lower extremities  Data Reviewed: Basic Metabolic Panel:  Lab 01/20/12 1478 01/19/12 0610 01/18/12 0450 01/17/12 1837 01/17/12 1356  NA 135 136 129* -- 128*  K 3.7 3.9 3.7 -- 3.5  CL 103 104 97 -- 95*  CO2 23 23 23  -- 23  GLUCOSE 116* 138* 102* -- 119*  BUN 11 12 14  -- 19  CREATININE 0.41* 0.47* 0.46* 0.54 0.55  CALCIUM 8.8 8.7 8.9 -- 9.0  MG -- -- -- -- --  PHOS -- -- -- -- --   Liver Function Tests: No results found for this basename: AST:5,ALT:5,ALKPHOS:5,BILITOT:5,PROT:5,ALBUMIN:5 in the last 168 hours No results found for this basename: LIPASE:5,AMYLASE:5 in the last 168 hours No results found for this basename: AMMONIA:5 in the last 168 hours CBC:  Lab 01/20/12 0515 01/19/12 0610 01/18/12 0450 01/17/12 1837 01/17/12 1356  WBC 21.2* 19.6* 15.8* 22.3* 17.8*  NEUTROABS -- -- -- -- 13.3*  HGB 7.9* 8.2* 6.8* 8.2* 7.6*  HCT 25.2* 25.8* 22.4* 25.8* 24.0*  MCV 73.3* 72.9* 72.3* 71.3* 71.4*  PLT 390 396 324 417* 395   Cardiac Enzymes: No results found for this basename: CKTOTAL:5,CKMB:5,CKMBINDEX:5,TROPONINI:5 in the last 168 hours BNP (last 3 results) No results found for this basename: PROBNP:3 in the last 8760 hours CBG: No results found for this basename: GLUCAP:5 in the last 168 hours  Recent Results (from the past 240 hour(s))  URINE CULTURE     Status: Normal   Collection Time   01/17/12  1:23 PM      Component Value Range Status Comment   Specimen Description URINE, CATHETERIZED   Final    Special Requests NONE   Final     Culture  Setup Time 01/18/2012 00:54   Final    Colony Count 20,OOO COLONIES/ML   Final    Culture ESCHERICHIA COLI   Final    Report Status 01/20/2012 FINAL   Final    Organism ID, Bacteria ESCHERICHIA COLI   Final   CULTURE, BLOOD (ROUTINE X 2)     Status: Normal (Preliminary result)   Collection Time   01/17/12  5:05 PM      Component Value Range Status Comment   Specimen Description BLOOD LEFT HAND   Final    Special Requests BOTTLES DRAWN AEROBIC AND ANAEROBIC 4CC   Final    Culture  Setup Time 01/17/2012 20:40   Final    Culture     Final    Value:  BLOOD CULTURE RECEIVED NO GROWTH TO DATE CULTURE WILL BE HELD FOR 5 DAYS BEFORE ISSUING A FINAL NEGATIVE REPORT   Report Status PENDING   Incomplete   CULTURE, BLOOD (ROUTINE X 2)     Status: Normal (Preliminary result)   Collection Time   01/17/12  5:20 PM      Component Value Range Status Comment   Specimen Description BLOOD RIGHT HAND   Final    Special Requests BOTTLES DRAWN AEROBIC ONLY 5CC   Final    Culture  Setup Time 01/17/2012 20:40   Final    Culture     Final    Value:        BLOOD CULTURE RECEIVED NO GROWTH TO DATE CULTURE WILL BE HELD FOR 5 DAYS BEFORE ISSUING A FINAL NEGATIVE REPORT   Report Status PENDING   Incomplete   MRSA PCR SCREENING     Status: Normal   Collection Time   01/17/12 11:14 PM      Component Value Range Status Comment   MRSA by PCR NEGATIVE  NEGATIVE Final      Studies: Dg Abd 1 View  12/20/2011  *RADIOLOGY REPORT*  Clinical Data: Upper abdominal pain for 3 days.  ABDOMEN - 1 VIEW  Comparison: 02/25/2011  Findings: Moderate gaseous distension of the stomach.  This could be due to dysmotility, gastroparesis, or outlet obstruction. Scattered gas and stool in the colon and small bowel.  No small or large bowel distension.  A large amount of stool appears to be in the rectosigmoid region.  Marked degenerative changes and deformities in the hips, similar to previous study and likely chronic.  IMPRESSION:  Gaseous distension of the stomach.  No evidence of bowel obstruction.  Chronic bony changes in the hips.  Original Report Authenticated By: Marlon Pel, M.D.   Dg Lumbar Spine 1 View  01/17/2012  *RADIOLOGY REPORT*  Clinical Data: Decubitus ulcer.  LUMBAR SPINE - 1 VIEW  Comparison: 12/20/2011.  Findings: Prominent amount of stool projects over the pelvis.  This limits detection of osseous injury.  The full extent of the sacrum is not included on present examination.  If clinically desired, CT imaging or MR would prove helpful.  Destruction of the right hip seen at the edge of the field of view.  IMPRESSION: Limited evaluation for detection of a sacral decubitus ulcer and associated osteomyelitis.  CT or MR may be considered for further delineation.   Original Report Authenticated By: Fuller Canada, M.D.    Dg Chest Portable 1 View  01/17/2012  *RADIOLOGY REPORT*  Clinical Data: Central line placement.  PORTABLE CHEST - 1 VIEW  Comparison: 01/17/2012 chest x-ray.  10/26/2010 CT.  Findings: Right central line tip projects at the level of the proximal superior vena cava.  No gross pneumothorax.  Right upper lobe ill-defined lesion.  This may represent residua from abnormality noted on prior CT.  Follow-up two-view chest or chest CT will be necessary for further delineation to exclude malignancy.  Cardiomegaly.  Pulmonary vascular prominence.  Scoliosis.  Prior left rib fracture.  IMPRESSION: Right central line tip projects at the level of the proximal superior vena cava.  No gross pneumothorax.  Right upper lobe ill-defined lesion.  This may represent residua from abnormality noted on prior CT.  Follow-up two-view chest or chest CT will be necessary for further delineation   Original Report Authenticated By: Fuller Canada, M.D.    Dg Chest Port 1 View  01/17/2012  *RADIOLOGY REPORT*  Clinical Data: Shortness of breath.  Nonsmoker.  PORTABLE CHEST - 1 VIEW  Comparison: 09/11/2011.  Findings:  Parenchymal changes right upper lobe without significant change.  It is possible this reflects scarring from remote inflammatory process (10/26/2010 CT).  This can be assessed on follow-up two-view chest when the patient is able.  Cardiomegaly.  Central pulmonary vascular prominence.  Small pleural effusions.  No gross pneumothorax.  IMPRESSION: Cardiomegaly with pulmonary vascular prominence and small pleural effusions.  Parenchymal changes right upper lobe with follow up as discussed above.   Original Report Authenticated By: Fuller Canada, M.D.    Dg Knee Left Port  01/17/2012  *RADIOLOGY REPORT*  Clinical Data: Left knee wound  PORTABLE LEFT KNEE - 1-2 VIEW  Comparison: None.  Findings: There is severe joint space narrowing of the medial and lateral compartments with essentially bone on bone positioning. There is severe osteophytosis of the medial and lateral compartment.  There is no significant suprapatellar joint effusion. No subcutaneous gas.  No clear evidence of osseous erosion.  IMPRESSION:  1.  Severe joint space narrowing of the medial and lateral compartments suggests severe osteoarthritis. 2.  No clear evidence of bone infection or soft tissue infection.   Original Report Authenticated By: Genevive Bi, M.D.     Scheduled Meds:    . baclofen  20 mg Oral QID  . collagenase   Topical Daily  . docusate sodium  100 mg Oral BID  . famotidine  20 mg Oral BID  . ferrous sulfate  325 mg Oral TID WC  . heparin  5,000 Units Subcutaneous Q8H  . meropenem (MERREM) IV  1 g Intravenous Q8H  . metoCLOPramide  10 mg Oral BID WC  . multivitamin with minerals  1 tablet Oral Daily  . potassium chloride SA  20 mEq Oral BID  . protein supplement  8 oz Oral BID BM  . sodium chloride  10-40 mL Intracatheter Q12H  . sodium chloride  3 mL Intravenous Q12H  . vancomycin  1,250 mg Intravenous Q12H  . vitamin C  500 mg Oral BID  . zinc sulfate  220 mg Oral Daily   Continuous Infusions:     Principal Problem:  *Infected decubitus ulcer Active Problems:  OSA (obstructive sleep apnea)  UTI (lower urinary tract infection)  Obesity  Chronic anemia  Quadriplegia  Urinary tract infection  Hyponatremia  Leukocytosis  Seizure disorder  HTN (hypertension)  Severe sepsis    Time spent: > 35 minutes    Penny Pia  [facility] Pager [phone]. If 8PM-8AM, please contact night-coverage at [email or web address], password [email or web address] 01/20/2012, 6:47 PM  LOS: 3 days

## 2012-01-21 DIAGNOSIS — N390 Urinary tract infection, site not specified: Secondary | ICD-10-CM

## 2012-01-21 LAB — TYPE AND SCREEN
ABO/RH(D): B POS
Unit division: 0

## 2012-01-21 NOTE — Progress Notes (Signed)
Lab called positive blood cultures on patient, MD has been informed.

## 2012-01-21 NOTE — Progress Notes (Signed)
[facility] PROGRESS NOTE  [name] [name] WUJ:[id number] DOB: Mar 17, 1966 DOA: 01/17/2012 PCP: [name], MD  Assessment/Plan: Principal Problem:  *Infected decubitus ulcer Active Problems:  OSA (obstructive sleep apnea)  UTI (lower urinary tract infection)  Obesity  Chronic anemia  Quadriplegia  Urinary tract infection  Hyponatremia  Leukocytosis  Seizure disorder  HTN (hypertension)  Severe sepsis  1. Leukocytosis - Will continue IV antibiotics as indicated by Dr. Orvan Falconer (ID). Pt will need prolonged antibiotic therapy and as such Picc line was placed. - CT of pelvis reviewed. - Blood culture has grown diphtheroids (corynebacterium species) - Blood culture sensitivities pending.   - ID on board and defer antibiotic regimen and duration to them.  2. UTI - Patient has chronic suprapubic cetheter in place.  Will continue antibiotic regimen. - Preliminary results shows E coli in urine culture. Sensitive to imipenem.  Continue current regimen as organism is resistant to bactrim.  ID recs to follow   3. Sacral decubitus - Per ID/Gen surge likely infectious source.  Wound care nurse consulted. - CT of pelvis evaluated - IV abx's Vancomycin and meropenem for now.  4. Obstructive sleep apnea  -patient is CPAP at night which will be continued.   5. Anemia of chronic disease and iron deficiency -hemoglobin is low, anemia panel has been ordered -Pt is currently getting transfused for hgb less than 7.  No active bleeding.  Will f/u with post h/h transfusion was 8.2 and today has decreased to 7.9 - Recheck hemoglobin tomorrow. - Looks like patient has iron deficiency anemia.   - Started ferrous sulfate tid - Monitor h/h next am. - May have myelosuppression from current infection as well.  6. Hyponatremia:  - Most likely secondary to low oral solute intake.  Slightly improved today.   -Will recheck tomorrow in am. - Has resolved with IVF rehydration.  I have discontinued  and will saline lock patient.  - Place on regular diet and should improved with improved po intake.  DVT Prophylaxis Heparin    Code Status: full Family Communication: No family at bedside Disposition Plan: Pending clinical improvement   Brief narrative: From Dr. Blair Dolphin note [name] is a 46 y.o. 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 He was readmitted yesterday after noting increased, foul-smelling drainage from his sacral wound for the last 3 weeks and a 24-hour period of feeling extremely warm and thirsty. He was found to be febrile and hypotensive upon admission. He is feeling much better today. He states that there've not been any changes to his wound care regimen recently. He has been followed by Dr. Shella Spearing at the [facility] but was last seen there 6 weeks ago. He states that his caregiver has been doing dressing changes on his wounds daily.  His suprapubic catheter is changed every 2 months. He has not had any problems with it becoming clogged or leaking. He states that he feels differently this time and when he has been admitted with urinary tract infections in the past. He has not been on any recent antibiotic therapy.   Consultants:  ID: Dr. Orvan Falconer  Dr. Johna Sheriff (gen surg)  Procedures:  CT of pelvis  Antibiotics:  Vancomycin and meropenem  HPI/Subjective: Patient has no new complaints today.  No acute issues reported overnight. Denies any chest pain or SOB.  Objective: Filed Vitals:   01/20/12 2300 01/21/12 0115  01/21/12 0746 01/21/12 1300  BP: 91/57  119/75 145/82  Pulse: 102  90 101  Temp: 102.1 F (38.9 C) 99.7 F (37.6 C) 98.7 F (37.1 C) 99.4 F (37.4 C)  TempSrc: Oral Oral Oral Oral  Resp: 18  18 16   Height:      Weight:   113.9 kg (251 lb 1.7 oz)   SpO2:   97% 100%    Intake/Output Summary (Last 24  hours) at 01/21/12 1726 Last data filed at 01/21/12 1722  Gross per 24 hour  Intake   4900 ml  Output   4900 ml  Net      0 ml   Filed Weights   01/19/12 0500 01/20/12 0500 01/21/12 0746  Weight: 113.4 kg (250 lb) 114.8 kg (253 lb 1.4 oz) 113.9 kg (251 lb 1.7 oz)    Exam:   General:  Pt in NAD, Alert and Awake  Cardiovascular: RRR, No MRG  Respiratory: CTA BL, No wheezes anteriorly  Abdomen: Soft, NT, ND  Neuro: Patient has muscle wasting in upper and lower extremities  Data Reviewed: Basic Metabolic Panel:  Lab 01/20/12 4782 01/19/12 0610 01/18/12 0450 01/17/12 1837 01/17/12 1356  NA 135 136 129* -- 128*  K 3.7 3.9 3.7 -- 3.5  CL 103 104 97 -- 95*  CO2 23 23 23  -- 23  GLUCOSE 116* 138* 102* -- 119*  BUN 11 12 14  -- 19  CREATININE 0.41* 0.47* 0.46* 0.54 0.55  CALCIUM 8.8 8.7 8.9 -- 9.0  MG -- -- -- -- --  PHOS -- -- -- -- --   Liver Function Tests: No results found for this basename: AST:5,ALT:5,ALKPHOS:5,BILITOT:5,PROT:5,ALBUMIN:5 in the last 168 hours No results found for this basename: LIPASE:5,AMYLASE:5 in the last 168 hours No results found for this basename: AMMONIA:5 in the last 168 hours CBC:  Lab 01/20/12 0515 01/19/12 0610 01/18/12 0450 01/17/12 1837 01/17/12 1356  WBC 21.2* 19.6* 15.8* 22.3* 17.8*  NEUTROABS -- -- -- -- 13.3*  HGB 7.9* 8.2* 6.8* 8.2* 7.6*  HCT 25.2* 25.8* 22.4* 25.8* 24.0*  MCV 73.3* 72.9* 72.3* 71.3* 71.4*  PLT 390 396 324 417* 395   Cardiac Enzymes: No results found for this basename: CKTOTAL:5,CKMB:5,CKMBINDEX:5,TROPONINI:5 in the last 168 hours BNP (last 3 results) No results found for this basename: PROBNP:3 in the last 8760 hours CBG: No results found for this basename: GLUCAP:5 in the last 168 hours  Recent Results (from the past 240 hour(s))  URINE CULTURE     Status: Normal   Collection Time   01/17/12  1:23 PM      Component Value Range Status Comment   Specimen Description URINE, CATHETERIZED   Final    Special  Requests NONE   Final    Culture  Setup Time 01/18/2012 00:54   Final    Colony Count 20,OOO COLONIES/ML   Final    Culture ESCHERICHIA COLI   Final    Report Status 01/20/2012 FINAL   Final    Organism ID, Bacteria ESCHERICHIA COLI   Final   CULTURE, BLOOD (ROUTINE X 2)     Status: Normal   Collection Time   01/17/12  5:05 PM      Component Value Range Status Comment   Specimen Description BLOOD LEFT HAND   Final    Special Requests BOTTLES DRAWN AEROBIC AND ANAEROBIC 4CC   Final    Culture  Setup Time 01/17/2012 20:40   Final    Culture     Final  Value: DIPHTHEROIDS(CORYNEBACTERIUM SPECIES)     Note: Standardized susceptibility testing for this organism is not available. CRITICAL RESULT CALLED TO, READ BACK BY AND VERIFIED WITH: Silvestre Gunner 01/21/12 @ 12:20PM BY RUSCA.   Report Status 01/21/2012 FINAL   Final   CULTURE, BLOOD (ROUTINE X 2)     Status: Normal (Preliminary result)   Collection Time   01/17/12  5:20 PM      Component Value Range Status Comment   Specimen Description BLOOD RIGHT HAND   Final    Special Requests BOTTLES DRAWN AEROBIC ONLY 5CC   Final    Culture  Setup Time 01/17/2012 20:40   Final    Culture     Final    Value:        BLOOD CULTURE RECEIVED NO GROWTH TO DATE CULTURE WILL BE HELD FOR 5 DAYS BEFORE ISSUING A FINAL NEGATIVE REPORT   Report Status PENDING   Incomplete   MRSA PCR SCREENING     Status: Normal   Collection Time   01/17/12 11:14 PM      Component Value Range Status Comment   MRSA by PCR NEGATIVE  NEGATIVE Final      Studies: Dg Abd 1 View  12/20/2011  *RADIOLOGY REPORT*  Clinical Data: Upper abdominal pain for 3 days.  ABDOMEN - 1 VIEW  Comparison: 02/25/2011  Findings: Moderate gaseous distension of the stomach.  This could be due to dysmotility, gastroparesis, or outlet obstruction. Scattered gas and stool in the colon and small bowel.  No small or large bowel distension.  A large amount of stool appears to be in the rectosigmoid region.   Marked degenerative changes and deformities in the hips, similar to previous study and likely chronic.  IMPRESSION: Gaseous distension of the stomach.  No evidence of bowel obstruction.  Chronic bony changes in the hips.  Original Report Authenticated By: Marlon Pel, M.D.   Dg Lumbar Spine 1 View  01/17/2012  *RADIOLOGY REPORT*  Clinical Data: Decubitus ulcer.  LUMBAR SPINE - 1 VIEW  Comparison: 12/20/2011.  Findings: Prominent amount of stool projects over the pelvis.  This limits detection of osseous injury.  The full extent of the sacrum is not included on present examination.  If clinically desired, CT imaging or MR would prove helpful.  Destruction of the right hip seen at the edge of the field of view.  IMPRESSION: Limited evaluation for detection of a sacral decubitus ulcer and associated osteomyelitis.  CT or MR may be considered for further delineation.   Original Report Authenticated By: Fuller Canada, M.D.    Dg Chest Portable 1 View  01/17/2012  *RADIOLOGY REPORT*  Clinical Data: Central line placement.  PORTABLE CHEST - 1 VIEW  Comparison: 01/17/2012 chest x-ray.  10/26/2010 CT.  Findings: Right central line tip projects at the level of the proximal superior vena cava.  No gross pneumothorax.  Right upper lobe ill-defined lesion.  This may represent residua from abnormality noted on prior CT.  Follow-up two-view chest or chest CT will be necessary for further delineation to exclude malignancy.  Cardiomegaly.  Pulmonary vascular prominence.  Scoliosis.  Prior left rib fracture.  IMPRESSION: Right central line tip projects at the level of the proximal superior vena cava.  No gross pneumothorax.  Right upper lobe ill-defined lesion.  This may represent residua from abnormality noted on prior CT.  Follow-up two-view chest or chest CT will be necessary for further delineation   Original Report Authenticated By: Almedia Balls.  Constance Goltz, M.D.    Dg Chest Port 1 View  01/17/2012  *RADIOLOGY REPORT*   Clinical Data: Shortness of breath.  Nonsmoker.  PORTABLE CHEST - 1 VIEW  Comparison: 09/11/2011.  Findings: Parenchymal changes right upper lobe without significant change.  It is possible this reflects scarring from remote inflammatory process (10/26/2010 CT).  This can be assessed on follow-up two-view chest when the patient is able.  Cardiomegaly.  Central pulmonary vascular prominence.  Small pleural effusions.  No gross pneumothorax.  IMPRESSION: Cardiomegaly with pulmonary vascular prominence and small pleural effusions.  Parenchymal changes right upper lobe with follow up as discussed above.   Original Report Authenticated By: Fuller Canada, M.D.    Dg Knee Left Port  01/17/2012  *RADIOLOGY REPORT*  Clinical Data: Left knee wound  PORTABLE LEFT KNEE - 1-2 VIEW  Comparison: None.  Findings: There is severe joint space narrowing of the medial and lateral compartments with essentially bone on bone positioning. There is severe osteophytosis of the medial and lateral compartment.  There is no significant suprapatellar joint effusion. No subcutaneous gas.  No clear evidence of osseous erosion.  IMPRESSION:  1.  Severe joint space narrowing of the medial and lateral compartments suggests severe osteoarthritis. 2.  No clear evidence of bone infection or soft tissue infection.   Original Report Authenticated By: Genevive Bi, M.D.     Scheduled Meds:    . baclofen  20 mg Oral QID  . collagenase   Topical Daily  . docusate sodium  100 mg Oral BID  . famotidine  20 mg Oral BID  . ferrous sulfate  325 mg Oral TID WC  . heparin  5,000 Units Subcutaneous Q8H  . meropenem (MERREM) IV  1 g Intravenous Q8H  . metoCLOPramide  10 mg Oral BID WC  . multivitamin with minerals  1 tablet Oral Daily  . potassium chloride SA  20 mEq Oral BID  . protein supplement  8 oz Oral BID BM  . sodium chloride  10-40 mL Intracatheter Q12H  . sodium chloride  3 mL Intravenous Q12H  . vancomycin  1,250 mg Intravenous  Q12H  . vitamin C  500 mg Oral BID  . zinc sulfate  220 mg Oral Daily   Continuous Infusions:    Principal Problem:  *Infected decubitus ulcer Active Problems:  OSA (obstructive sleep apnea)  UTI (lower urinary tract infection)  Obesity  Chronic anemia  Quadriplegia  Urinary tract infection  Hyponatremia  Leukocytosis  Seizure disorder  HTN (hypertension)  Severe sepsis    Time spent: > 35 minutes    Penny Pia  [facility] Pager [phone]. If 8PM-8AM, please contact night-coverage at [email or web address], password [facility] 01/21/2012, 5:26 PM  LOS: 4 days

## 2012-01-21 NOTE — Progress Notes (Addendum)
Placed pt on cpap for rest.  Cpap 19cm h2o per home settings with full face mask as he wears this at home.  Pt is tolerating well at this time.  RN aware.  Sterile water added to chamber for humidity.

## 2012-01-22 DIAGNOSIS — G40909 Epilepsy, unspecified, not intractable, without status epilepticus: Secondary | ICD-10-CM

## 2012-01-22 LAB — DIFFERENTIAL
Basophils Relative: 0 % (ref 0–1)
Eosinophils Relative: 3 % (ref 0–5)
Lymphs Abs: 2.2 10*3/uL (ref 0.7–4.0)
Monocytes Absolute: 1.8 10*3/uL — ABNORMAL HIGH (ref 0.1–1.0)
Neutro Abs: 12.5 10*3/uL — ABNORMAL HIGH (ref 1.7–7.7)

## 2012-01-22 LAB — CBC
HCT: 25.9 % — ABNORMAL LOW (ref 39.0–52.0)
MCHC: 31.3 g/dL (ref 30.0–36.0)
Platelets: 505 10*3/uL — ABNORMAL HIGH (ref 150–400)
RDW: 17.4 % — ABNORMAL HIGH (ref 11.5–15.5)
WBC: 18.3 10*3/uL — ABNORMAL HIGH (ref 4.0–10.5)

## 2012-01-22 LAB — SEDIMENTATION RATE: Sed Rate: 120 mm/hr — ABNORMAL HIGH (ref 0–16)

## 2012-01-22 LAB — C-REACTIVE PROTEIN: CRP: 15.9 mg/dL — ABNORMAL HIGH (ref <0.60)

## 2012-01-22 NOTE — Progress Notes (Signed)
[facility] PROGRESS NOTE  [name] [name] ZOX:[id number] DOB: Apr 30, 1966 DOA: 01/17/2012 PCP: [name], MD  Assessment/Plan: Principal Problem:  *Infected decubitus ulcer Active Problems:  OSA (obstructive sleep apnea)  UTI (lower urinary tract infection)  Obesity  Chronic anemia  Quadriplegia  Urinary tract infection  Hyponatremia  Leukocytosis  Seizure disorder  HTN (hypertension)  Severe sepsis  1. Leukocytosis - Will continue IV antibiotics as indicated by Infectious disease. Pt currently on Vancomycin and meropenem day # 6 of both. - Will need 6 weeks of IV antibiotics.  Have filled out home health order. -Pt will need prolonged antibiotic therapy and as such Picc line was placed. - CT of pelvis reviewed.  Will ask IR if they can aspirate and culture fluid collection by femur per ID's recommendations. - Blood culture has grown diphtheroids (corynebacterium species) though to be contaminant   2. UTI - Patient has chronic suprapubic cetheter in place.  Will continue antibiotic regimen. - Preliminary results shows E coli in urine culture. Sensitive to imipenem.  Continue current regimen as organism is resistant to bactrim.    3. Sacral decubitus - Wound care nurse consulted. - CT of pelvis evaluated. Likely contributed to source for osteomyelitis - IV abx's Vancomycin and meropenem for 6 wks  4. Obstructive sleep apnea  -patient is CPAP at night which will be continued.   5. Anemia of chronic disease and iron deficiency -hemoglobin is low, anemia panel has been ordered -Pt is currently getting transfused for hgb less than 7.  No active bleeding.  Will f/u with post h/h transfusion was 8.2 and today has decreased to 8.1 - Recheck hemoglobin tomorrow. - Looks like patient has iron deficiency anemia.   - Started ferrous sulfate tid - Monitor h/h next am. - May have myelosuppression from current infection as well.  6. Hyponatremia:  - Most likely secondary to  low oral solute intake.  Slightly improved today.   -Will recheck tomorrow in am. - Has resolved with IVF rehydration.  I have discontinued and will saline lock patient.  - Place on regular diet and should improved with improved po intake.  DVT Prophylaxis Heparin    Code Status: full Family Communication: No family at bedside Disposition Plan: Pending clinical improvement   Brief narrative: From Dr. Blair Dolphin note [name] [name] is a 46 y.o. 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 He was readmitted yesterday after noting increased, foul-smelling drainage from his sacral wound for the last 3 weeks and a 24-hour period of feeling extremely warm and thirsty. He was found to be febrile and hypotensive upon admission. He is feeling much better today. He states that there've not been any changes to his wound care regimen recently. He has been followed by Dr. Shella Spearing at the [facility] but was last seen there 6 weeks ago. He states that his caregiver has been doing dressing changes on his wounds daily.  His suprapubic catheter is changed every 2 months. He has not had any problems with it becoming clogged or leaking. He states that he feels differently this time and when he has been admitted with urinary tract infections in the past. He has not been on any recent antibiotic therapy.   Consultants:  ID: Dr. Orvan Falconer  Dr. Johna Sheriff (gen surg)  Procedures:  CT of pelvis  Antibiotics:  Vancomycin and meropenem Day # 6 on 9/08  HPI/Subjective: Patient has no new complaints today.  No acute issues reported overnight. Denies any pain.  Objective: Filed Vitals:   01/21/12 1300 01/21/12 2219 01/22/12 0452 01/22/12 1420  BP: 145/82 103/68 122/80 106/70  Pulse: 101 100 95 103  Temp: 99.4 F (37.4 C) 99.1 F (37.3 C) 97.9 F (36.6 C) 97.8 F (36.6 C)    TempSrc: Oral Oral Oral Oral  Resp: 16 18 18 18   Height:      Weight:   114.5 kg (252 lb 6.8 oz)   SpO2: 100% 98% 98% 97%    Intake/Output Summary (Last 24 hours) at 01/22/12 1624 Last data filed at 01/22/12 1500  Gross per 24 hour  Intake    980 ml  Output   3675 ml  Net  -2695 ml   Filed Weights   01/20/12 0500 01/21/12 0746 01/22/12 0452  Weight: 114.8 kg (253 lb 1.4 oz) 113.9 kg (251 lb 1.7 oz) 114.5 kg (252 lb 6.8 oz)    Exam:   General:  Pt in NAD, Alert and Awake  Cardiovascular: RRR, No MRG  Respiratory: CTA BL, No wheezes anteriorly  Abdomen: Soft, NT, ND  Neuro: Patient has muscle wasting in upper and lower extremities  Data Reviewed: Basic Metabolic Panel:  Lab 01/20/12 1610 01/19/12 0610 01/18/12 0450 01/17/12 1837 01/17/12 1356  NA 135 136 129* -- 128*  K 3.7 3.9 3.7 -- 3.5  CL 103 104 97 -- 95*  CO2 23 23 23  -- 23  GLUCOSE 116* 138* 102* -- 119*  BUN 11 12 14  -- 19  CREATININE 0.41* 0.47* 0.46* 0.54 0.55  CALCIUM 8.8 8.7 8.9 -- 9.0  MG -- -- -- -- --  PHOS -- -- -- -- --   Liver Function Tests: No results found for this basename: AST:5,ALT:5,ALKPHOS:5,BILITOT:5,PROT:5,ALBUMIN:5 in the last 168 hours No results found for this basename: LIPASE:5,AMYLASE:5 in the last 168 hours No results found for this basename: AMMONIA:5 in the last 168 hours CBC:  Lab 01/22/12 1230 01/22/12 0600 01/20/12 0515 01/19/12 0610 01/18/12 0450 01/17/12 1837 01/17/12 1356  WBC -- 18.3* 21.2* 19.6* 15.8* 22.3* --  NEUTROABS PENDING -- -- -- -- -- 13.3*  HGB -- 8.1* 7.9* 8.2* 6.8* 8.2* --  HCT -- 25.9* 25.2* 25.8* 22.4* 25.8* --  MCV -- 73.4* 73.3* 72.9* 72.3* 71.3* --  PLT -- 505* 390 396 324 417* --   Cardiac Enzymes: No results found for this basename: CKTOTAL:5,CKMB:5,CKMBINDEX:5,TROPONINI:5 in the last 168 hours BNP (last 3 results) No results found for this basename: PROBNP:3 in the last 8760 hours CBG: No results found for this basename: GLUCAP:5 in the  last 168 hours  Recent Results (from the past 240 hour(s))  URINE CULTURE     Status: Normal   Collection Time   01/17/12  1:23 PM      Component Value Range Status Comment   Specimen Description URINE, CATHETERIZED   Final    Special Requests NONE   Final    Culture  Setup Time 01/18/2012 00:54   Final    Colony Count 20,OOO COLONIES/ML   Final    Culture ESCHERICHIA COLI   Final    Report Status 01/20/2012 FINAL   Final    Organism ID, Bacteria ESCHERICHIA COLI   Final   CULTURE, BLOOD (ROUTINE X 2)     Status: Normal   Collection Time   01/17/12  5:05 PM      Component Value Range Status Comment   Specimen  Description BLOOD LEFT HAND   Final    Special Requests BOTTLES DRAWN AEROBIC AND ANAEROBIC 4CC   Final    Culture  Setup Time 01/17/2012 20:40   Final    Culture     Final    Value: DIPHTHEROIDS(CORYNEBACTERIUM SPECIES)     Note: Standardized susceptibility testing for this organism is not available. CRITICAL RESULT CALLED TO, READ BACK BY AND VERIFIED WITH: Silvestre Gunner 01/21/12 @ 12:20PM BY RUSCA.   Report Status 01/21/2012 FINAL   Final   CULTURE, BLOOD (ROUTINE X 2)     Status: Normal (Preliminary result)   Collection Time   01/17/12  5:20 PM      Component Value Range Status Comment   Specimen Description BLOOD RIGHT HAND   Final    Special Requests BOTTLES DRAWN AEROBIC ONLY 5CC   Final    Culture  Setup Time 01/17/2012 20:40   Final    Culture     Final    Value:        BLOOD CULTURE RECEIVED NO GROWTH TO DATE CULTURE WILL BE HELD FOR 5 DAYS BEFORE ISSUING A FINAL NEGATIVE REPORT   Report Status PENDING   Incomplete   MRSA PCR SCREENING     Status: Normal   Collection Time   01/17/12 11:14 PM      Component Value Range Status Comment   MRSA by PCR NEGATIVE  NEGATIVE Final      Studies: Dg Abd 1 View  12/20/2011  *RADIOLOGY REPORT*  Clinical Data: Upper abdominal pain for 3 days.  ABDOMEN - 1 VIEW  Comparison: 02/25/2011  Findings: Moderate gaseous distension of the  stomach.  This could be due to dysmotility, gastroparesis, or outlet obstruction. Scattered gas and stool in the colon and small bowel.  No small or large bowel distension.  A large amount of stool appears to be in the rectosigmoid region.  Marked degenerative changes and deformities in the hips, similar to previous study and likely chronic.  IMPRESSION: Gaseous distension of the stomach.  No evidence of bowel obstruction.  Chronic bony changes in the hips.  Original Report Authenticated By: Marlon Pel, M.D.   Dg Lumbar Spine 1 View  01/17/2012  *RADIOLOGY REPORT*  Clinical Data: Decubitus ulcer.  LUMBAR SPINE - 1 VIEW  Comparison: 12/20/2011.  Findings: Prominent amount of stool projects over the pelvis.  This limits detection of osseous injury.  The full extent of the sacrum is not included on present examination.  If clinically desired, CT imaging or MR would prove helpful.  Destruction of the right hip seen at the edge of the field of view.  IMPRESSION: Limited evaluation for detection of a sacral decubitus ulcer and associated osteomyelitis.  CT or MR may be considered for further delineation.   Original Report Authenticated By: Fuller Canada, M.D.    Dg Chest Portable 1 View  01/17/2012  *RADIOLOGY REPORT*  Clinical Data: Central line placement.  PORTABLE CHEST - 1 VIEW  Comparison: 01/17/2012 chest x-ray.  10/26/2010 CT.  Findings: Right central line tip projects at the level of the proximal superior vena cava.  No gross pneumothorax.  Right upper lobe ill-defined lesion.  This may represent residua from abnormality noted on prior CT.  Follow-up two-view chest or chest CT will be necessary for further delineation to exclude malignancy.  Cardiomegaly.  Pulmonary vascular prominence.  Scoliosis.  Prior left rib fracture.  IMPRESSION: Right central line tip projects at the level of the proximal  superior vena cava.  No gross pneumothorax.  Right upper lobe ill-defined lesion.  This may represent  residua from abnormality noted on prior CT.  Follow-up two-view chest or chest CT will be necessary for further delineation   Original Report Authenticated By: Fuller Canada, M.D.    Dg Chest Port 1 View  01/17/2012  *RADIOLOGY REPORT*  Clinical Data: Shortness of breath.  Nonsmoker.  PORTABLE CHEST - 1 VIEW  Comparison: 09/11/2011.  Findings: Parenchymal changes right upper lobe without significant change.  It is possible this reflects scarring from remote inflammatory process (10/26/2010 CT).  This can be assessed on follow-up two-view chest when the patient is able.  Cardiomegaly.  Central pulmonary vascular prominence.  Small pleural effusions.  No gross pneumothorax.  IMPRESSION: Cardiomegaly with pulmonary vascular prominence and small pleural effusions.  Parenchymal changes right upper lobe with follow up as discussed above.   Original Report Authenticated By: Fuller Canada, M.D.    Dg Knee Left Port  01/17/2012  *RADIOLOGY REPORT*  Clinical Data: Left knee wound  PORTABLE LEFT KNEE - 1-2 VIEW  Comparison: None.  Findings: There is severe joint space narrowing of the medial and lateral compartments with essentially bone on bone positioning. There is severe osteophytosis of the medial and lateral compartment.  There is no significant suprapatellar joint effusion. No subcutaneous gas.  No clear evidence of osseous erosion.  IMPRESSION:  1.  Severe joint space narrowing of the medial and lateral compartments suggests severe osteoarthritis. 2.  No clear evidence of bone infection or soft tissue infection.   Original Report Authenticated By: Genevive Bi, M.D.     Scheduled Meds:    . baclofen  20 mg Oral QID  . collagenase   Topical Daily  . docusate sodium  100 mg Oral BID  . famotidine  20 mg Oral BID  . ferrous sulfate  325 mg Oral TID WC  . heparin  5,000 Units Subcutaneous Q8H  . meropenem (MERREM) IV  1 g Intravenous Q8H  . metoCLOPramide  10 mg Oral BID WC  . multivitamin with  minerals  1 tablet Oral Daily  . potassium chloride SA  20 mEq Oral BID  . protein supplement  8 oz Oral BID BM  . sodium chloride  10-40 mL Intracatheter Q12H  . sodium chloride  3 mL Intravenous Q12H  . vancomycin  1,250 mg Intravenous Q12H  . vitamin C  500 mg Oral BID  . zinc sulfate  220 mg Oral Daily   Continuous Infusions:    Principal Problem:  *Infected decubitus ulcer Active Problems:  OSA (obstructive sleep apnea)  UTI (lower urinary tract infection)  Obesity  Chronic anemia  Quadriplegia  Urinary tract infection  Hyponatremia  Leukocytosis  Seizure disorder  HTN (hypertension)  Severe sepsis    Time spent: > 35 minutes    Penny Pia  [facility] Pager [phone]. If 8PM-8AM, please contact night-coverage at [email or web address], password [facility] 01/22/2012, 4:24 PM  LOS: 5 days

## 2012-01-22 NOTE — Progress Notes (Addendum)
Patient ID: Armond Cuthrell, male   DOB: Mar 12, 1966, 46 y.o.   MRN: [id number]    [facility]    Date of Admission:  01/17/2012    Total abtx day # 6        Day 6 vancomycin        Day 6 meropenem Principal Problem:  *Infected decubitus ulcer Active Problems:  OSA (obstructive sleep apnea)  UTI (lower urinary tract infection)  Obesity  Chronic anemia  Quadriplegia  Urinary tract infection  Hyponatremia  Leukocytosis  Seizure disorder  HTN (hypertension)  Severe sepsis      . baclofen  20 mg Oral QID  . collagenase   Topical Daily  . docusate sodium  100 mg Oral BID  . famotidine  20 mg Oral BID  . ferrous sulfate  325 mg Oral TID WC  . heparin  5,000 Units Subcutaneous Q8H  . meropenem (MERREM) IV  1 g Intravenous Q8H  . metoCLOPramide  10 mg Oral BID WC  . multivitamin with minerals  1 tablet Oral Daily  . potassium chloride SA  20 mEq Oral BID  . protein supplement  8 oz Oral BID BM  . sodium chloride  10-40 mL Intracatheter Q12H  . sodium chloride  3 mL Intravenous Q12H  . vancomycin  1,250 mg Intravenous Q12H  . vitamin C  500 mg Oral BID  . zinc sulfate  220 mg Oral Daily    Subjective: Afebrile x 36hrs ; last temp 9/6 where his tmax was 102F; Still having sweats.  Objective: Temp:  [97.9 F (36.6 C)-99.4 F (37.4 C)] 97.9 F (36.6 C) (09/08 0452) Pulse Rate:  [95-101] 95  (09/08 0452) Resp:  [16-18] 18  (09/08 0452) BP: (103-145)/(68-82) 122/80 mmHg (09/08 0452) SpO2:  [98 %-100 %] 98 % (09/08 0452) Weight:  [252 lb 6.8 oz (114.5 kg)] 252 lb 6.8 oz (114.5 kg) (09/08 0452)  General: He is alert in no distress, working on his computer, in good spirits HEENT: no thrush Abd: suprapubic catheter in place Ext= LUE picc line in place, hands in braces; LE wasting, pedal edema R> L Skin: mild odor to his wounds  Lab Results Lab Results  Component Value Date   WBC 18.3* 01/22/2012   HGB 8.1* 01/22/2012   HCT 25.9* 01/22/2012   MCV 73.4*  01/22/2012   PLT 505* 01/22/2012    Lab Results  Component Value Date   CREATININE 0.41* 01/20/2012   BUN 11 01/20/2012   NA 135 01/20/2012   K 3.7 01/20/2012   CL 103 01/20/2012   CO2 23 01/20/2012    Lab Results  Component Value Date   ALT 15 02/25/2011   AST 13 02/25/2011   ALKPHOS 99 02/25/2011   BILITOT 0.1* 02/25/2011      Microbiology: 9/3 blood cultures 1 of 2 sets + diptheroids 9/3 urine cx: e.coli: 20,000 cfu: (S CTX, R FQs, R tmp/smx)  Studies/Results: 9/4 abd ct results show: Prominent sclerotic change in both ischial tuberosities deep to  soft tissue changes consistent with decubitus ulcers. While bony  mineralization in the anatomic pelvis is diffusely heterogeneous,  this level of sclerosis would be consistent with age indeterminate  bony infection.  The dysmorphic, chronically dislocated right hip has a gas filled  tract which extends from the posterior skin deep to the level of  the dysmorphic femoral head. The gas can be seen tracking to  within 2 mm of the cortical bone which shows  some underlying focal  sclerosis and irregularity. Bony infection at this level is  considered likely.  3.2 x 3.6 cm low attenuating apparent fluid collection is seen just  inferomedial to the dysmorphic right femoral head. Although there  is no rim enhancement with gas within the collection,  superinfection is not excluded by imaging  Assessment: 46yo M with C5 paraplegia c/b sacral decubiti and neurogenic bladder no with suprapubic catheter p/w fever/chills/foul smelling drainage from R ischial decubitus ulcer. CT shows 3.2 x 3.6 fluidd collection along right femoral head and signs c/w osteomyelitis.   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 Infectious work up thus far shows e.coli colonization in bladder (20K CFU) and probable contaminant in blood cx of diptheroids in 1 of 2 sets  Plan: 1. Continue  current antibiotics for now 2. Will add diff to this am cbc 3. Altlhough the fluid collection maybe sterile with 6 days of empiric treatment, please ask IR if it is feasible to aspirate the 3.2 x 3.6cm fluid collection by femoral head. Please send cx for aerobic, anaerobic, and fungal culture. 3.   Will treat for presumed osteo for a course of 4-6 wks of IV antibiotics and transition to an oral regimen as an outpatient 4.   Please check ESR and CRP as baseline markers. 5. If recurrent fever of temp > 100.3, please repeat blood cx x 2  Judyann Munson, MD [facility] [phone] pager [phone] cell 01/22/2012, 10:31 AM

## 2012-01-22 NOTE — Progress Notes (Addendum)
Placed pt on cpap for rest, 19cm h2o per home settings with full face mask as he wears this at home.  Pt is tolerating well at this time.  RN aware.  Sterile water added to chamber for humidity.

## 2012-01-23 ENCOUNTER — Encounter (HOSPITAL_BASED_OUTPATIENT_CLINIC_OR_DEPARTMENT_OTHER): Payer: Medicare Other | Attending: Plastic Surgery

## 2012-01-23 DIAGNOSIS — L8994 Pressure ulcer of unspecified site, stage 4: Secondary | ICD-10-CM | POA: Insufficient documentation

## 2012-01-23 DIAGNOSIS — L89899 Pressure ulcer of other site, unspecified stage: Secondary | ICD-10-CM | POA: Insufficient documentation

## 2012-01-23 DIAGNOSIS — L89309 Pressure ulcer of unspecified buttock, unspecified stage: Secondary | ICD-10-CM | POA: Insufficient documentation

## 2012-01-23 DIAGNOSIS — L89109 Pressure ulcer of unspecified part of back, unspecified stage: Secondary | ICD-10-CM | POA: Insufficient documentation

## 2012-01-23 LAB — CULTURE, BLOOD (ROUTINE X 2): Culture: NO GROWTH

## 2012-01-23 LAB — VANCOMYCIN, TROUGH: Vancomycin Tr: 12.3 ug/mL (ref 10.0–20.0)

## 2012-01-23 MED ORDER — VANCOMYCIN HCL 1000 MG IV SOLR
1500.0000 mg | Freq: Two times a day (BID) | INTRAVENOUS | Status: DC
  Administered 2012-01-23 – 2012-01-24 (×2): 1500 mg via INTRAVENOUS
  Filled 2012-01-23 (×3): qty 1500

## 2012-01-23 NOTE — Progress Notes (Signed)
ANTIBIOTIC CONSULT NOTE - FOLLOW UP  Pharmacy Consult for Vancomycin, Meropenem Indication: UTI, osteomyelitis   No Known Allergies  Patient Measurements: Height: 6' (182.9 cm) Weight: 252 lb 13.9 oz (114.7 kg) IBW/kg (Calculated) : 77.6   Vital Signs: Temp: 99.4 F (37.4 C) (09/09 2120) Temp src: Oral (09/09 2120) BP: 106/65 mmHg (09/09 2120) Pulse Rate: 90  (09/09 2120) Intake/Output from previous day: 09/08 0701 - 09/09 0700 In: 2050 [P.O.:1680; I.V.:20; IV Piggyback:350] Out: 2550 [Urine:2550] Intake/Output from this shift:    Labs:  Basename 01/22/12 0600  WBC 18.3*  HGB 8.1*  PLT 505*  LABCREA --  CREATININE --   Estimated Creatinine Clearance: 152.4 ml/min (by C-G formula based on Cr of 0.41). No results found for this basename: VANCOTROUGH:2,VANCOPEAK:2,VANCORANDOM:2,GENTTROUGH:2,GENTPEAK:2,GENTRANDOM:2,TOBRATROUGH:2,TOBRAPEAK:2,TOBRARND:2,AMIKACINPEAK:2,AMIKACINTROU:2,AMIKACIN:2, in the last 72 hours   Assessment:  46 yom on D#7 of Vancomycin and meropenem.  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  Plan discharge home tomorrow if stable.   Vancomycin trough tonight = 12.3 (subtherapeutic), on vancomycin 1250 mg IV q12h.  Goal of Therapy:  Vancomycin trough level 15-20 mcg/ml  Plan:   Increase Vancomycin 1500 mg IV q12h  Recommend Vancomycin trough and Scr weekly outpatient  Geoffry Paradise Thi 01/23/2012,9:21 PM

## 2012-01-23 NOTE — Progress Notes (Signed)
[facility] PROGRESS NOTE  [name] [name] ZOX:[id number] DOB: 1966-05-08 DOA: 01/17/2012 PCP: [name], MD  Assessment/Plan: Principal Problem:  *Infected decubitus ulcer Active Problems:  OSA (obstructive sleep apnea)  UTI (lower urinary tract infection)  Obesity  Chronic anemia  Quadriplegia  Urinary tract infection  Hyponatremia  Leukocytosis  Seizure disorder  HTN (hypertension)  Severe sepsis  1. Leukocytosis - Will continue IV antibiotics as indicated by Infectious disease. Pt currently on Vancomycin and meropenem day # 7 of both. Treat for 5 more weeks as outpatient. - Have filled out home health order. -Pt will need prolonged antibiotic therapy and as such Picc line was placed. - Blood culture has grown diphtheroids (corynebacterium species) though to be contaminant   2. UTI - Patient has chronic suprapubic cetheter in place.  Will continue antibiotic regimen. - Preliminary results shows E coli in urine culture. Sensitive to imipenem.  Continue current regimen as organism is resistant to bactrim.    3. Sacral decubitus - Wound care nurse consulted. - CT of pelvis evaluated. Likely contributed to source for osteomyelitis - IV abx's Vancomycin and meropenem for 6 wks total  4. Obstructive sleep apnea  -patient is CPAP at night which will be continued.   5. Anemia of chronic disease and iron deficiency -hemoglobin is low, anemia panel has been ordered -Pt is currently getting transfused for hgb less than 7.  No active bleeding.  Will f/u with post h/h transfusion was 8.2 and today has decreased to 8.1 - Recheck hemoglobin tomorrow. - Looks like patient has iron deficiency anemia.   - Started ferrous sulfate tid - Monitor h/h next am. - May have myelosuppression from current infection as well.  6. Hyponatremia:  - Most likely secondary to low oral solute intake.  Slightly improved today.   -Will recheck tomorrow in am. - Has resolved with IVF  rehydration.  I have discontinued and will saline lock patient.  - Place on regular diet and should improved with improved po intake.  DVT Prophylaxis Heparin    Code Status: full Family Communication: No family at bedside Disposition Plan: D/C likely 9/10 if no new medical problems arise   Brief narrative: From Dr. Blair Dolphin note [name] [name] is a 46 y.o. 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 He was readmitted yesterday after noting increased, foul-smelling drainage from his sacral wound for the last 3 weeks and a 24-hour period of feeling extremely warm and thirsty. He was found to be febrile and hypotensive upon admission. He is feeling much better today. He states that there've not been any changes to his wound care regimen recently. He has been followed by Dr. Shella Spearing at the [facility] but was last seen there 6 weeks ago. He states that his caregiver has been doing dressing changes on his wounds daily.  His suprapubic catheter is changed every 2 months. He has not had any problems with it becoming clogged or leaking. He states that he feels differently this time and when he has been admitted with urinary tract infections in the past. He has not been on any recent antibiotic therapy.   Consultants:  ID: Dr. Orvan Falconer  Dr. Johna Sheriff (gen surg)  Procedures:  CT of pelvis  Antibiotics:  Vancomycin and meropenem Day # 6 on 9/08  HPI/Subjective: Patient has no new complaints today.  No acute issues reported overnight. Denies any pain.  Objective: Filed Vitals:   01/22/12 2125 01/23/12 0527 01/23/12 1100 01/23/12 1700  BP: 113/72 126/82 112/74 112/61  Pulse: 108 107 106 105  Temp: 98.2 F (36.8 C) 100.8 F (38.2 C) 98.1 F (36.7 C) 98.4 F (36.9 C)  TempSrc: Oral Axillary Oral Oral  Resp: 18 20 20 18   Height:      Weight:  114.7 kg  (252 lb 13.9 oz)    SpO2: 99% 97% 99% 100%    Intake/Output Summary (Last 24 hours) at 01/23/12 1952 Last data filed at 01/23/12 1759  Gross per 24 hour  Intake    865 ml  Output   4325 ml  Net  -3460 ml   Filed Weights   01/21/12 0746 01/22/12 0452 01/23/12 0527  Weight: 113.9 kg (251 lb 1.7 oz) 114.5 kg (252 lb 6.8 oz) 114.7 kg (252 lb 13.9 oz)    Exam:   General:  Pt in NAD, Alert and Awake  Cardiovascular: RRR, No MRG  Respiratory: CTA BL, No wheezes anteriorly  Abdomen: Soft, NT, ND  Neuro: Patient has muscle wasting in upper and lower extremities  Data Reviewed: Basic Metabolic Panel:  Lab 01/20/12 0865 01/19/12 0610 01/18/12 0450 01/17/12 1837 01/17/12 1356  NA 135 136 129* -- 128*  K 3.7 3.9 3.7 -- 3.5  CL 103 104 97 -- 95*  CO2 23 23 23  -- 23  GLUCOSE 116* 138* 102* -- 119*  BUN 11 12 14  -- 19  CREATININE 0.41* 0.47* 0.46* 0.54 0.55  CALCIUM 8.8 8.7 8.9 -- 9.0  MG -- -- -- -- --  PHOS -- -- -- -- --   Liver Function Tests: No results found for this basename: AST:5,ALT:5,ALKPHOS:5,BILITOT:5,PROT:5,ALBUMIN:5 in the last 168 hours No results found for this basename: LIPASE:5,AMYLASE:5 in the last 168 hours No results found for this basename: AMMONIA:5 in the last 168 hours CBC:  Lab 01/22/12 1230 01/22/12 0600 01/20/12 0515 01/19/12 0610 01/18/12 0450 01/17/12 1837 01/17/12 1356  WBC -- 18.3* 21.2* 19.6* 15.8* 22.3* --  NEUTROABS 12.5* -- -- -- -- -- 13.3*  HGB -- 8.1* 7.9* 8.2* 6.8* 8.2* --  HCT -- 25.9* 25.2* 25.8* 22.4* 25.8* --  MCV -- 73.4* 73.3* 72.9* 72.3* 71.3* --  PLT -- 505* 390 396 324 417* --   Cardiac Enzymes: No results found for this basename: CKTOTAL:5,CKMB:5,CKMBINDEX:5,TROPONINI:5 in the last 168 hours BNP (last 3 results) No results found for this basename: PROBNP:3 in the last 8760 hours CBG: No results found for this basename: GLUCAP:5 in the last 168 hours  Recent Results (from the past 240 hour(s))  URINE CULTURE      Status: Normal   Collection Time   01/17/12  1:23 PM      Component Value Range Status Comment   Specimen Description URINE, CATHETERIZED   Final    Special Requests NONE   Final    Culture  Setup Time 01/18/2012 00:54   Final    Colony Count 20,OOO COLONIES/ML   Final    Culture ESCHERICHIA COLI   Final    Report Status 01/20/2012 FINAL   Final    Organism ID, Bacteria ESCHERICHIA COLI   Final   CULTURE, BLOOD (ROUTINE X 2)     Status: Normal   Collection Time   01/17/12  5:05 PM      Component Value Range Status Comment   Specimen Description BLOOD LEFT HAND   Final    Special Requests BOTTLES DRAWN AEROBIC AND ANAEROBIC 4CC  Final    Culture  Setup Time 01/17/2012 20:40   Final    Culture     Final    Value: DIPHTHEROIDS(CORYNEBACTERIUM SPECIES)     Note: Standardized susceptibility testing for this organism is not available. CRITICAL RESULT CALLED TO, READ BACK BY AND VERIFIED WITH: Silvestre Gunner 01/21/12 @ 12:20PM BY RUSCA.   Report Status 01/21/2012 FINAL   Final   CULTURE, BLOOD (ROUTINE X 2)     Status: Normal   Collection Time   01/17/12  5:20 PM      Component Value Range Status Comment   Specimen Description BLOOD RIGHT HAND   Final    Special Requests BOTTLES DRAWN AEROBIC ONLY 5CC   Final    Culture  Setup Time 01/17/2012 20:40   Final    Culture NO GROWTH 5 DAYS   Final    Report Status 01/23/2012 FINAL   Final   MRSA PCR SCREENING     Status: Normal   Collection Time   01/17/12 11:14 PM      Component Value Range Status Comment   MRSA by PCR NEGATIVE  NEGATIVE Final      Studies: Dg Abd 1 View  12/20/2011  *RADIOLOGY REPORT*  Clinical Data: Upper abdominal pain for 3 days.  ABDOMEN - 1 VIEW  Comparison: 02/25/2011  Findings: Moderate gaseous distension of the stomach.  This could be due to dysmotility, gastroparesis, or outlet obstruction. Scattered gas and stool in the colon and small bowel.  No small or large bowel distension.  A large amount of stool appears to be in  the rectosigmoid region.  Marked degenerative changes and deformities in the hips, similar to previous study and likely chronic.  IMPRESSION: Gaseous distension of the stomach.  No evidence of bowel obstruction.  Chronic bony changes in the hips.  Original Report Authenticated By: Marlon Pel, M.D.   Dg Lumbar Spine 1 View  01/17/2012  *RADIOLOGY REPORT*  Clinical Data: Decubitus ulcer.  LUMBAR SPINE - 1 VIEW  Comparison: 12/20/2011.  Findings: Prominent amount of stool projects over the pelvis.  This limits detection of osseous injury.  The full extent of the sacrum is not included on present examination.  If clinically desired, CT imaging or MR would prove helpful.  Destruction of the right hip seen at the edge of the field of view.  IMPRESSION: Limited evaluation for detection of a sacral decubitus ulcer and associated osteomyelitis.  CT or MR may be considered for further delineation.   Original Report Authenticated By: Fuller Canada, M.D.    Dg Chest Portable 1 View  01/17/2012  *RADIOLOGY REPORT*  Clinical Data: Central line placement.  PORTABLE CHEST - 1 VIEW  Comparison: 01/17/2012 chest x-ray.  10/26/2010 CT.  Findings: Right central line tip projects at the level of the proximal superior vena cava.  No gross pneumothorax.  Right upper lobe ill-defined lesion.  This may represent residua from abnormality noted on prior CT.  Follow-up two-view chest or chest CT will be necessary for further delineation to exclude malignancy.  Cardiomegaly.  Pulmonary vascular prominence.  Scoliosis.  Prior left rib fracture.  IMPRESSION: Right central line tip projects at the level of the proximal superior vena cava.  No gross pneumothorax.  Right upper lobe ill-defined lesion.  This may represent residua from abnormality noted on prior CT.  Follow-up two-view chest or chest CT will be necessary for further delineation   Original Report Authenticated By: Fuller Canada, M.D.  Dg Chest Port 1  View  01/17/2012  *RADIOLOGY REPORT*  Clinical Data: Shortness of breath.  Nonsmoker.  PORTABLE CHEST - 1 VIEW  Comparison: 09/11/2011.  Findings: Parenchymal changes right upper lobe without significant change.  It is possible this reflects scarring from remote inflammatory process (10/26/2010 CT).  This can be assessed on follow-up two-view chest when the patient is able.  Cardiomegaly.  Central pulmonary vascular prominence.  Small pleural effusions.  No gross pneumothorax.  IMPRESSION: Cardiomegaly with pulmonary vascular prominence and small pleural effusions.  Parenchymal changes right upper lobe with follow up as discussed above.   Original Report Authenticated By: Fuller Canada, M.D.    Dg Knee Left Port  01/17/2012  *RADIOLOGY REPORT*  Clinical Data: Left knee wound  PORTABLE LEFT KNEE - 1-2 VIEW  Comparison: None.  Findings: There is severe joint space narrowing of the medial and lateral compartments with essentially bone on bone positioning. There is severe osteophytosis of the medial and lateral compartment.  There is no significant suprapatellar joint effusion. No subcutaneous gas.  No clear evidence of osseous erosion.  IMPRESSION:  1.  Severe joint space narrowing of the medial and lateral compartments suggests severe osteoarthritis. 2.  No clear evidence of bone infection or soft tissue infection.   Original Report Authenticated By: Genevive Bi, M.D.     Scheduled Meds:    . baclofen  20 mg Oral QID  . collagenase   Topical Daily  . docusate sodium  100 mg Oral BID  . famotidine  20 mg Oral BID  . ferrous sulfate  325 mg Oral TID WC  . heparin  5,000 Units Subcutaneous Q8H  . meropenem (MERREM) IV  1 g Intravenous Q8H  . metoCLOPramide  10 mg Oral BID WC  . multivitamin with minerals  1 tablet Oral Daily  . potassium chloride SA  20 mEq Oral BID  . protein supplement  8 oz Oral BID BM  . sodium chloride  10-40 mL Intracatheter Q12H  . sodium chloride  3 mL Intravenous Q12H   . vancomycin  1,250 mg Intravenous Q12H  . vitamin C  500 mg Oral BID  . zinc sulfate  220 mg Oral Daily   Continuous Infusions:    Principal Problem:  *Infected decubitus ulcer Active Problems:  OSA (obstructive sleep apnea)  UTI (lower urinary tract infection)  Obesity  Chronic anemia  Quadriplegia  Urinary tract infection  Hyponatremia  Leukocytosis  Seizure disorder  HTN (hypertension)  Severe sepsis    Time spent: > 35 minutes    Penny Pia  [facility] Pager [phone]. If 8PM-8AM, please contact night-coverage at [email or web address], password [facility] 01/23/2012, 7:52 PM  LOS: 6 days

## 2012-01-23 NOTE — Progress Notes (Signed)
Patient ID: [name] [name], male   DOB: 03-10-1966, 46 y.o.   MRN: [id number]    [facility]    Date of Admission:  01/17/2012           Day 7 vancomycin        Day 7 meropenem Principal Problem:  *Infected decubitus ulcer Active Problems:  Severe sepsis  OSA (obstructive sleep apnea)  UTI (lower urinary tract infection)  Obesity  Chronic anemia  Quadriplegia  Urinary tract infection  Hyponatremia  Leukocytosis  Seizure disorder  HTN (hypertension)      . baclofen  20 mg Oral QID  . collagenase   Topical Daily  . docusate sodium  100 mg Oral BID  . famotidine  20 mg Oral BID  . ferrous sulfate  325 mg Oral TID WC  . heparin  5,000 Units Subcutaneous Q8H  . meropenem (MERREM) IV  1 g Intravenous Q8H  . metoCLOPramide  10 mg Oral BID WC  . multivitamin with minerals  1 tablet Oral Daily  . potassium chloride SA  20 mEq Oral BID  . protein supplement  8 oz Oral BID BM  . sodium chloride  10-40 mL Intracatheter Q12H  . sodium chloride  3 mL Intravenous Q12H  . vancomycin  1,250 mg Intravenous Q12H  . vitamin C  500 mg Oral BID  . zinc sulfate  220 mg Oral Daily    Subjective: He states that he is feeling good. He notes that he frequently has drenching sweats even when he is not sick.  Objective: Temp:  [98.1 F (36.7 C)-100.8 F (38.2 C)] 98.1 F (36.7 C) (09/09 1100) Pulse Rate:  [106-108] 106  (09/09 1100) Resp:  [18-20] 20  (09/09 1100) BP: (112-126)/(72-82) 112/74 mmHg (09/09 1100) SpO2:  [97 %-99 %] 99 % (09/09 1100) Weight:  [114.7 kg (252 lb 13.9 oz)] 114.7 kg (252 lb 13.9 oz) (09/09 0527)  General: Diaphoretic but appears quite comfortable Skin: No rash Lungs: Clear Cor: Regular S1 and S2 no murmurs Abdomen: Soft and distended  Lab Results Lab Results  Component Value Date   WBC 18.3* 01/22/2012   HGB 8.1* 01/22/2012   HCT 25.9* 01/22/2012   MCV 73.4* 01/22/2012   PLT 505* 01/22/2012    Lab Results  Component Value Date   CREATININE 0.41* 01/20/2012   BUN 11 01/20/2012   NA 135 01/20/2012   K 3.7 01/20/2012   CL 103 01/20/2012   CO2 23 01/20/2012    Lab Results  Component Value Date   ALT 15 02/25/2011   AST 13 02/25/2011   ALKPHOS 99 02/25/2011   BILITOT 0.1* 02/25/2011      Microbiology: Recent Results (from the past 240 hour(s))  URINE CULTURE     Status: Normal   Collection Time   01/17/12  1:23 PM      Component Value Range Status Comment   Specimen Description URINE, CATHETERIZED   Final    Special Requests NONE   Final    Culture  Setup Time 01/18/2012 00:54   Final    Colony Count 20,OOO COLONIES/ML   Final    Culture ESCHERICHIA COLI   Final    Report Status 01/20/2012 FINAL   Final    Organism ID, Bacteria ESCHERICHIA COLI   Final   CULTURE, BLOOD (ROUTINE X 2)     Status: Normal   Collection Time   01/17/12  5:05 PM      Component Value Range  Status Comment   Specimen Description BLOOD LEFT HAND   Final    Special Requests BOTTLES DRAWN AEROBIC AND ANAEROBIC 4CC   Final    Culture  Setup Time 01/17/2012 20:40   Final    Culture     Final    Value: DIPHTHEROIDS(CORYNEBACTERIUM SPECIES)     Note: Standardized susceptibility testing for this organism is not available. CRITICAL RESULT CALLED TO, READ BACK BY AND VERIFIED WITH: Silvestre Gunner 01/21/12 @ 12:20PM BY RUSCA.   Report Status 01/21/2012 FINAL   Final   CULTURE, BLOOD (ROUTINE X 2)     Status: Normal   Collection Time   01/17/12  5:20 PM      Component Value Range Status Comment   Specimen Description BLOOD RIGHT HAND   Final    Special Requests BOTTLES DRAWN AEROBIC ONLY 5CC   Final    Culture  Setup Time 01/17/2012 20:40   Final    Culture NO GROWTH 5 DAYS   Final    Report Status 01/23/2012 FINAL   Final   MRSA PCR SCREENING     Status: Normal   Collection Time   01/17/12 11:14 PM      Component Value Range Status Comment   MRSA by PCR NEGATIVE  NEGATIVE Final    Assessment: His only had one recent low-grade fever and appears to be  improving. I would recommend discharge home soon if he does not have any new problems. I would not pursue any further diagnostic testing. 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  Plan: 1. Continue current antibiotics 2. Consider discharge home tomorrow  Cliffton Asters, MD [facility] [phone] pager   [phone] cell 01/23/2012, 3:35 PM

## 2012-01-24 MED ORDER — VANCOMYCIN HCL 1000 MG IV SOLR: 1500.0000 mg | Freq: Two times a day (BID) | INTRAVENOUS | Status: DC

## 2012-01-24 MED ORDER — HEPARIN SOD (PORK) LOCK FLUSH 100 UNIT/ML IV SOLN
250.0000 [IU] | Freq: Every day | INTRAVENOUS | Status: DC
  Filled 2012-01-24: qty 3

## 2012-01-24 MED ORDER — FERROUS SULFATE 325 (65 FE) MG PO TABS: 325.0000 mg | ORAL_TABLET | Freq: Three times a day (TID) | ORAL | Status: DC

## 2012-01-24 MED ORDER — HEPARIN SOD (PORK) LOCK FLUSH 100 UNIT/ML IV SOLN
250.0000 [IU] | INTRAVENOUS | Status: DC | PRN
  Filled 2012-01-24: qty 3

## 2012-01-24 MED ORDER — SODIUM CHLORIDE 0.9 % IV SOLN: 1.0000 g | Freq: Three times a day (TID) | INTRAVENOUS | Status: DC

## 2012-01-24 NOTE — Progress Notes (Signed)
Patient ID: [name] [name], male   DOB: 1966-03-27, 46 y.o.   MRN: [id number]    [facility]    Date of Admission:  01/17/2012   Total days of antibiotics 8          Principal Problem:  *Infected decubitus ulcer Active Problems:  Severe sepsis  OSA (obstructive sleep apnea)  UTI (lower urinary tract infection)  Obesity  Chronic anemia  Quadriplegia  Urinary tract infection  Hyponatremia  Leukocytosis  Seizure disorder  HTN (hypertension)      . baclofen  20 mg Oral QID  . collagenase   Topical Daily  . docusate sodium  100 mg Oral BID  . famotidine  20 mg Oral BID  . ferrous sulfate  325 mg Oral TID WC  . heparin  5,000 Units Subcutaneous Q8H  . meropenem (MERREM) IV  1 g Intravenous Q8H  . metoCLOPramide  10 mg Oral BID WC  . multivitamin with minerals  1 tablet Oral Daily  . potassium chloride SA  20 mEq Oral BID  . protein supplement  8 oz Oral BID BM  . sodium chloride  10-40 mL Intracatheter Q12H  . sodium chloride  3 mL Intravenous Q12H  . vancomycin  1,500 mg Intravenous Q12H  . vitamin C  500 mg Oral BID  . zinc sulfate  220 mg Oral Daily  . DISCONTD: vancomycin  1,250 mg Intravenous Q12H    Subjective: He denies any complaints  Objective: Temp:  [98.4 F (36.9 C)-99.4 F (37.4 C)] 98.4 F (36.9 C) (09/10 1358) Pulse Rate:  [86-105] 86  (09/10 1358) Resp:  [18] 18  (09/10 1358) BP: (106-136)/(61-84) 136/84 mmHg (09/10 1358) SpO2:  [99 %-100 %] 99 % (09/10 0650) Weight:  [115.5 kg (254 lb 10.1 oz)] 115.5 kg (254 lb 10.1 oz) (09/10 0650)  General:  watching TV  Lab Results Lab Results  Component Value Date   WBC 18.3* 01/22/2012   HGB 8.1* 01/22/2012   HCT 25.9* 01/22/2012   MCV 73.4* 01/22/2012   PLT 505* 01/22/2012    Lab Results  Component Value Date   CREATININE 0.41* 01/20/2012   BUN 11 01/20/2012   NA 135 01/20/2012   K 3.7 01/20/2012   CL 103 01/20/2012   CO2 23 01/20/2012    Lab Results  Component Value Date   ALT 15  02/25/2011   AST 13 02/25/2011   ALKPHOS 99 02/25/2011   BILITOT 0.1* 02/25/2011      Microbiology: Recent Results (from the past 240 hour(s))  URINE CULTURE     Status: Normal   Collection Time   01/17/12  1:23 PM      Component Value Range Status Comment   Specimen Description URINE, CATHETERIZED   Final    Special Requests NONE   Final    Culture  Setup Time 01/18/2012 00:54   Final    Colony Count 20,OOO COLONIES/ML   Final    Culture ESCHERICHIA COLI   Final    Report Status 01/20/2012 FINAL   Final    Organism ID, Bacteria ESCHERICHIA COLI   Final   CULTURE, BLOOD (ROUTINE X 2)     Status: Normal   Collection Time   01/17/12  5:05 PM      Component Value Range Status Comment   Specimen Description BLOOD LEFT HAND   Final    Special Requests BOTTLES DRAWN AEROBIC AND ANAEROBIC 4CC   Final    Culture  Setup  Time 01/17/2012 20:40   Final    Culture     Final    Value: DIPHTHEROIDS(CORYNEBACTERIUM SPECIES)     Note: Standardized susceptibility testing for this organism is not available. CRITICAL RESULT CALLED TO, READ BACK BY AND VERIFIED WITH: Silvestre Gunner 01/21/12 @ 12:20PM BY RUSCA.   Report Status 01/21/2012 FINAL   Final   CULTURE, BLOOD (ROUTINE X 2)     Status: Normal   Collection Time   01/17/12  5:20 PM      Component Value Range Status Comment   Specimen Description BLOOD RIGHT HAND   Final    Special Requests BOTTLES DRAWN AEROBIC ONLY 5CC   Final    Culture  Setup Time 01/17/2012 20:40   Final    Culture NO GROWTH 5 DAYS   Final    Report Status 01/23/2012 FINAL   Final   MRSA PCR SCREENING     Status: Normal   Collection Time   01/17/12 11:14 PM      Component Value Range Status Comment   MRSA by PCR NEGATIVE  NEGATIVE Final   WOUND CULTURE     Status: Normal (Preliminary result)   Collection Time   01/23/12  4:53 PM      Component Value Range Status Comment   Specimen Description ULCER   Final    Special Requests Normal   Final    Gram Stain     Final    Value:  RARE WBC PRESENT,BOTH PMN AND MONONUCLEAR     RARE SQUAMOUS EPITHELIAL CELLS PRESENT     NO ORGANISMS SEEN   Culture NO GROWTH   Final    Report Status PENDING   Incomplete     Assessment: He has defervesced. I agree with plan to discharge him home on empiric IV vancomycin and meropenem for continued treatment of his sacral wound infection and right femoral head osteomyelitis.  Plan: 1. Continue current antibiotics through October 14 2. I will arrange ID clinic for followup in early October  Cliffton Asters, MD [facility] [phone] pager   [phone] cell 01/24/2012, 2:54 PM

## 2012-01-24 NOTE — Discharge Summary (Signed)
Physician Discharge Summary  [name] [name] ZOX:[id number] DOB: 14-Jul-1965 DOA: 01/17/2012  PCP: [name] Aliment, MD  Admit date: 01/17/2012 Discharge date: 01/24/2012  Recommendations for Outpatient Follow-up:  1. Please follow up with CBC 2. Patient will need vancomycin monitoring per home health protocol 3. Lasix held on discharge.  Please reassess patient and decide whether he will need to continue this medication moving forward.  Discharge Diagnoses:  Principal Problem:  *Infected decubitus ulcer Active Problems:  OSA (obstructive sleep apnea)  UTI (lower urinary tract infection)  Obesity  Chronic anemia  Quadriplegia  Urinary tract infection  Hyponatremia  Leukocytosis  Seizure disorder  HTN (hypertension)  Severe sepsis   Discharge Condition: Stable  Diet recommendation: regular diet  Filed Weights   01/22/12 0452 01/23/12 0527 01/24/12 0650  Weight: 114.5 kg (252 lb 6.8 oz) 114.7 kg (252 lb 13.9 oz) 115.5 kg (254 lb 10.1 oz)    History of present illness:  From original HPI: [name] [name] is a 46 y.o. male, who is quadriplegic, has stage IV decubitus ulcer, chronic suprapubic catheter, diverting colostomy comes to the hospital with a one-day history of fever and chills, patient has history of Pseudomonas urinary tract infection which is resistant to fluoroquinolones, according to the patient he has a caregiver who comes to his house for his tachycardia but is also dressing changes, however he says he has been noticing some foul-smelling odor from his tachycardia but is also for the past few days, he presented to the ER about 5 hours ago where he was found to have low-grade fever and I was called to admit the patient for UTI.  Unfortunately patient is here in the ER for 5 hours, we do not have IV access and the patient, so far blood cultures antibiotics have not been initiated, I have requested pulmonary critical care to place a central line as PICC line team is unable to help [place]  at this time and IR has told us they will not be able to place a PICC line either.   Hospital Course:  1. Leukocytosis - Will continue IV antibiotics as indicated by Infectious disease. Pt currently on Vancomycin and meropenem day # 8 of both. Treat for 5 more weeks as outpatient.  - Have filled out home health order.  Vancomycin monitoring to be monitored by home health protocol. -Pt will need prolonged antibiotic therapy and as such Picc line was placed.  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  2. UTI  - Patient has chronic suprapubic cetheter in place. Will continue antibiotic regimen.  - Preliminary results shows E coli in urine culture. Sensitive to imipenem. Continue current regimen as organism is resistant to bactrim.   3. Sacral decubitus  - Wound care nurse consulted.  - CT of pelvis evaluated. Likely contributed to source for osteomyelitis  - IV abx's Vancomycin and meropenem for 6 wks total (To complete 5 wks as outpatient)  4. Obstructive sleep apnea  -patient is CPAP at night which will be continued.   5. Anemia of chronic disease and iron deficiency  -hemoglobin is low, anemia panel has been ordered and showed Iron deficiency - No active bleeding. - Started ferrous sulfate tid and will plan on continuing as outpatient. Should improve with resolution of infection and ferrous sulfate administration. - Monitor h/h next am.  - May have myelosuppression from current infection as well.  6. Hyponatremia:  - Most likely secondary to low oral solute intake. Slightly improved today.  - Has resolved with IVF rehydration.  - Place on regular diet and improved with improved po intake.   DVT Prophylaxis Heparin while patient was in house.   Procedures:  CT of pelvis  Consultations:  Infectious Disease  Discharge Exam: Filed Vitals:   01/23/12  1100 01/23/12 1700 01/23/12 2120 01/24/12 0650  BP: 112/74 112/61 106/65 118/78  Pulse: 106 105 90 101  Temp: 98.1 F (36.7 C) 98.4 F (36.9 C) 99.4 F (37.4 C) 99.4 F (37.4 C)  TempSrc: Oral Oral Oral Oral  Resp: 20 18 18 18   Height:      Weight:    115.5 kg (254 lb 10.1 oz)  SpO2: 99% 100% 100% 99%    General: Pt in NAD, A and O x 3 Cardiovascular: RRR, No MRG Respiratory: CTA BL, no wheezes  Discharge Instructions  Discharge Orders    Future Appointments: Provider: Department: Dept Phone: Center:   02/27/2012 8:15 AM [facility] [phone] [facility]     Future Orders Please Complete By Expires   Diet - low sodium heart healthy      Increase activity slowly      Discharge instructions      Comments:   Discharge home with home health.  He is to complete a 5 week course of IV antibiotic therapy.  Also has home health.   Pt to follow up with Infectious disease doctor [name] [name] at [phone] for office follow up in 2-3 weeks.  I have discontinued your lasix and potassium replacement.  Follow up with your primary care physician for further adjustment of these medications.   Call MD for:  temperature >100.4      Call MD for:  severe uncontrolled pain      Call MD for:  extreme fatigue        Medication List  As of 01/24/2012 11:11 AM   STOP taking these medications         furosemide 20 MG tablet      potassium chloride SA 20 MEQ tablet         TAKE these medications         baclofen 20 MG tablet   Commonly known as: LIORESAL   Take 20 mg by mouth 4 (four) times daily.      docusate sodium 100 MG capsule   Commonly known as: COLACE   Take 100 mg by mouth 2 (two) times daily.      famotidine 20 MG tablet   Commonly known as: PEPCID   Take 20 mg by mouth 2 (two) times daily.      ferrous sulfate 325 (65 FE) MG tablet   Take 325 mg by mouth 2 (two) times daily.      metoCLOPramide 10 MG tablet   Commonly known as: REGLAN     Take 10 mg by mouth 2 (two) times daily with a meal.      multivitamin with minerals Tabs   Take 1 tablet by mouth daily.      sodium chloride 0.9 % SOLN 100 mL with meropenem 1 G SOLR 1 g   Inject 1 g into the vein every 8 (eight) hours.      sodium chloride 0.9 % SOLN 500 mL with vancomycin 1000 MG SOLR 1,500 mg   Inject 1,500 mg into the vein every 12 (twelve) hours.  vitamin C 500 MG tablet   Commonly known as: ASCORBIC ACID   Take 500 mg by mouth 2 (two) times daily.      zinc gluconate 50 MG tablet   Take 50 mg by mouth 2 (two) times daily. For wound healing.              The results of significant diagnostics from this hospitalization (including imaging, microbiology, ancillary and laboratory) are listed below for reference.    Significant Diagnostic Studies: Ct Pelvis W Contrast  01/18/2012  *RADIOLOGY REPORT*  Clinical Data:  Multiple sacral decubitus ulcers with infection. Evaluate for undrained abscess or evidence of osteomyelitis  CT PELVIS WITH CONTRAST  Technique:  Multidetector CT imaging of the pelvis was performed using the standard protocol following the bolus administration of intravenous contrast.  Contrast: OMNIPAQUE IOHEXOL 300 MG/ML  SOLN  Comparison:   None.  Findings:  There is prominent fascial laxity of the lower abdominal wall.  Incompletely visualized, a suprapubic tube is visualized in situ.  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  Borderline lymphadenopathy is seen in long both pelvic sidewalls.  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 A posterior decubitus ulcer on the right tracks deep to the level of the dysmorphic right femoral head.  The gas filled tract can be followed to within 2 mm of the cortical bone which appears to have some cortical thickening and irregularity at this level.  As such, imaging features must be considered suspicious for osteomyelitis.  There is a  3.2 x 3.6 cm fluid collection inferomedial to the dysmorphic right femoral head.  Multiple bone/ossific fragments are seen in the same region.  There is skin irregularity and thinning of the subcutaneous fat overlying both ischial tuberosities.  Overall, there is prominent sclerosis of both ischial tuberosities and this sclerotic change extends up into the innominate bone of the right hip.  There is no evidence for focal fluid collection in the region of either ischial tuberosity or the left hip.  IMPRESSION: Prominent sclerotic change in both ischial tuberosities deep to soft tissue changes consistent with decubitus ulcers. While bony mineralization in the anatomic pelvis is diffusely heterogeneous, this level of sclerosis would be consistent with age indeterminate bony infection.  The dysmorphic, chronically dislocated right hip has a gas filled tract which extends from the posterior skin deep to the level of the dysmorphic femoral head.  The gas can be seen tracking to within 2 mm of the cortical bone which shows some underlying focal sclerosis and irregularity.  Bony infection at this level is considered likely.  3.2 x 3.6 cm low attenuating apparent fluid collection is seen just inferomedial to the dysmorphic right femoral head. Although there is no rim enhancement with gas within the collection, superinfection is not excluded by imaging.   Original Report Authenticated By: ERIC A. MANSELL, M.D.    Dg Lumbar Spine 1 View  01/17/2012  *RADIOLOGY REPORT*  Clinical Data: Decubitus ulcer.  LUMBAR SPINE - 1 VIEW  Comparison: 12/20/2011.  Findings: Prominent amount of stool projects over the pelvis.  This limits detection of osseous injury.  The full extent of the sacrum is not included on present examination.  If clinically desired, CT imaging or MR would prove helpful.  Destruction of the right hip seen at the edge of the field of view.  IMPRESSION: Limited evaluation for detection of a sacral decubitus ulcer and  associated osteomyelitis.  CT or MR may be considered for further delineation.   Original Report Authenticated By: Fuller Canada, M.D.    US Renal  01/19/2012  *RADIOLOGY REPORT*  Clinical Data: Chronic cystitis.  Neurogenic bladder.  RENAL/URINARY TRACT ULTRASOUND COMPLETE  Comparison:  CT pelvis 01/18/2012.  Findings:  Right Kidney:  Measures 11.5 cm and appears normal without stone, mass or hydronephrosis.  Left Kidney:  Measures 13.9 cm and appears normal without stone, mass or hydronephrosis.  Bladder:  Completely decompressed with a suprapubic catheter in place.  IMPRESSION: Normal-appearing kidneys.  Suprapubic catheter noted.   Original Report Authenticated By: Bernadene Bell. D'ALESSIO, M.D.    Dg Chest Portable 1 View  01/17/2012  *RADIOLOGY REPORT*  Clinical Data: Central line placement.  PORTABLE CHEST - 1 VIEW  Comparison: 01/17/2012 chest x-ray.  10/26/2010 CT.  Findings: Right central line tip projects at the level of the proximal superior vena cava.  No gross pneumothorax.  Right upper lobe ill-defined lesion.  This may represent residua from abnormality noted on prior CT.  Follow-up two-view chest or chest CT will be necessary for further delineation to exclude malignancy.  Cardiomegaly.  Pulmonary vascular prominence.  Scoliosis.  Prior left rib fracture.  IMPRESSION: Right central line tip projects at the level of the proximal superior vena cava.  No gross pneumothorax.  Right upper lobe ill-defined lesion.  This may represent residua from abnormality noted on prior CT.  Follow-up two-view chest or chest CT will be necessary for further delineation   Original Report Authenticated By: Fuller Canada, M.D.    Dg Chest Port 1 View  01/17/2012  *RADIOLOGY REPORT*  Clinical Data: Shortness of breath.  Nonsmoker.  PORTABLE CHEST - 1 VIEW  Comparison: 09/11/2011.  Findings: Parenchymal changes right upper lobe without significant change.  It is possible this reflects scarring from remote inflammatory  process (10/26/2010 CT).  This can be assessed on follow-up two-view chest when the patient is able.  Cardiomegaly.  Central pulmonary vascular prominence.  Small pleural effusions.  No gross pneumothorax.  IMPRESSION: Cardiomegaly with pulmonary vascular prominence and small pleural effusions.  Parenchymal changes right upper lobe with follow up as discussed above.   Original Report Authenticated By: Fuller Canada, M.D.    Dg Knee Left Port  01/17/2012  *RADIOLOGY REPORT*  Clinical Data: Left knee wound  PORTABLE LEFT KNEE - 1-2 VIEW  Comparison: None.  Findings: There is severe joint space narrowing of the medial and lateral compartments with essentially bone on bone positioning. There is severe osteophytosis of the medial and lateral compartment.  There is no significant suprapatellar joint effusion. No subcutaneous gas.  No clear evidence of osseous erosion.  IMPRESSION:  1.  Severe joint space narrowing of the medial and lateral compartments suggests severe osteoarthritis. 2.  No clear evidence of bone infection or soft tissue infection.   Original Report Authenticated By: Genevive Bi, M.D.     Microbiology: Recent Results (from the past 240 hour(s))  URINE CULTURE     Status: Normal   Collection Time   01/17/12  1:23 PM      Component Value Range Status Comment   Specimen Description URINE, CATHETERIZED   Final    Special Requests NONE   Final    Culture  Setup Time 01/18/2012 00:54   Final    Colony Count 20,OOO COLONIES/ML   Final    Culture ESCHERICHIA COLI   Final    Report Status 01/20/2012 FINAL   Final  Organism ID, Bacteria ESCHERICHIA COLI   Final   CULTURE, BLOOD (ROUTINE X 2)     Status: Normal   Collection Time   01/17/12  5:05 PM      Component Value Range Status Comment   Specimen Description BLOOD LEFT HAND   Final    Special Requests BOTTLES DRAWN AEROBIC AND ANAEROBIC 4CC   Final    Culture  Setup Time 01/17/2012 20:40   Final    Culture     Final    Value:  DIPHTHEROIDS(CORYNEBACTERIUM SPECIES)     Note: Standardized susceptibility testing for this organism is not available. CRITICAL RESULT CALLED TO, READ BACK BY AND VERIFIED WITH: Silvestre Gunner 01/21/12 @ 12:20PM BY RUSCA.   Report Status 01/21/2012 FINAL   Final   CULTURE, BLOOD (ROUTINE X 2)     Status: Normal   Collection Time   01/17/12  5:20 PM      Component Value Range Status Comment   Specimen Description BLOOD RIGHT HAND   Final    Special Requests BOTTLES DRAWN AEROBIC ONLY 5CC   Final    Culture  Setup Time 01/17/2012 20:40   Final    Culture NO GROWTH 5 DAYS   Final    Report Status 01/23/2012 FINAL   Final   MRSA PCR SCREENING     Status: Normal   Collection Time   01/17/12 11:14 PM      Component Value Range Status Comment   MRSA by PCR NEGATIVE  NEGATIVE Final   WOUND CULTURE     Status: Normal (Preliminary result)   Collection Time   01/23/12  4:53 PM      Component Value Range Status Comment   Specimen Description ULCER   Final    Special Requests Normal   Final    Gram Stain     Final    Value: RARE WBC PRESENT,BOTH PMN AND MONONUCLEAR     RARE SQUAMOUS EPITHELIAL CELLS PRESENT     NO ORGANISMS SEEN   Culture NO GROWTH   Final    Report Status PENDING   Incomplete      Labs: Basic Metabolic Panel:  Lab 01/20/12 1610 01/19/12 0610 01/18/12 0450 01/17/12 1837 01/17/12 1356  NA 135 136 129* -- 128*  K 3.7 3.9 3.7 -- 3.5  CL 103 104 97 -- 95*  CO2 23 23 23  -- 23  GLUCOSE 116* 138* 102* -- 119*  BUN 11 12 14  -- 19  CREATININE 0.41* 0.47* 0.46* 0.54 0.55  CALCIUM 8.8 8.7 8.9 -- 9.0  MG -- -- -- -- --  PHOS -- -- -- -- --   Liver Function Tests: No results found for this basename: AST:5,ALT:5,ALKPHOS:5,BILITOT:5,PROT:5,ALBUMIN:5 in the last 168 hours No results found for this basename: LIPASE:5,AMYLASE:5 in the last 168 hours No results found for this basename: AMMONIA:5 in the last 168 hours CBC:  Lab 01/22/12 1230 01/22/12 0600 01/20/12 0515 01/19/12 0610  01/18/12 0450 01/17/12 1837 01/17/12 1356  WBC -- 18.3* 21.2* 19.6* 15.8* 22.3* --  NEUTROABS 12.5* -- -- -- -- -- 13.3*  HGB -- 8.1* 7.9* 8.2* 6.8* 8.2* --  HCT -- 25.9* 25.2* 25.8* 22.4* 25.8* --  MCV -- 73.4* 73.3* 72.9* 72.3* 71.3* --  PLT -- 505* 390 396 324 417* --   Cardiac Enzymes: No results found for this basename: CKTOTAL:5,CKMB:5,CKMBINDEX:5,TROPONINI:5 in the last 168 hours BNP: BNP (last 3 results) No results found for this basename: PROBNP:3 in the last 8760 hours  CBG: No results found for this basename: GLUCAP:5 in the last 168 hours  Time coordinating discharge: > 40 minutes  Signed:  Penny Pia  [facility] 01/24/2012, 11:11 AM

## 2012-01-26 LAB — WOUND CULTURE

## 2012-01-27 ENCOUNTER — Encounter (HOSPITAL_COMMUNITY): Payer: Self-pay | Admitting: *Deleted

## 2012-01-27 ENCOUNTER — Emergency Department (HOSPITAL_COMMUNITY)
Admission: EM | Admit: 2012-01-27 | Discharge: 2012-01-27 | Disposition: A | Discharge status: Home / Self Care | Payer: Medicare Other | Attending: Emergency Medicine | Admitting: Emergency Medicine

## 2012-01-27 DIAGNOSIS — Y92009 Unspecified place in unspecified non-institutional (private) residence as the place of occurrence of the external cause: Secondary | ICD-10-CM | POA: Insufficient documentation

## 2012-01-27 DIAGNOSIS — Z981 Arthrodesis status: Secondary | ICD-10-CM | POA: Insufficient documentation

## 2012-01-27 DIAGNOSIS — G8250 Quadriplegia, unspecified: Secondary | ICD-10-CM | POA: Insufficient documentation

## 2012-01-27 DIAGNOSIS — T82898A Other specified complication of vascular prosthetic devices, implants and grafts, initial encounter: Secondary | ICD-10-CM

## 2012-01-27 DIAGNOSIS — J9610 Chronic respiratory failure, unspecified whether with hypoxia or hypercapnia: Secondary | ICD-10-CM | POA: Insufficient documentation

## 2012-01-27 DIAGNOSIS — G4733 Obstructive sleep apnea (adult) (pediatric): Secondary | ICD-10-CM | POA: Insufficient documentation

## 2012-01-27 DIAGNOSIS — E669 Obesity, unspecified: Secondary | ICD-10-CM | POA: Insufficient documentation

## 2012-01-27 DIAGNOSIS — Z79899 Other long term (current) drug therapy: Secondary | ICD-10-CM | POA: Insufficient documentation

## 2012-01-27 DIAGNOSIS — T85698A Other mechanical complication of other specified internal prosthetic devices, implants and grafts, initial encounter: Secondary | ICD-10-CM | POA: Insufficient documentation

## 2012-01-27 DIAGNOSIS — Y849 Medical procedure, unspecified as the cause of abnormal reaction of the patient, or of later complication, without mention of misadventure at the time of the procedure: Secondary | ICD-10-CM | POA: Insufficient documentation

## 2012-01-27 MED ORDER — VANCOMYCIN HCL 1000 MG IV SOLR
1500.0000 mg | Freq: Two times a day (BID) | INTRAVENOUS | Status: DC
  Administered 2012-01-27: 1500 mg via INTRAVENOUS
  Filled 2012-01-27 (×2): qty 1500

## 2012-01-27 MED ORDER — ALTEPLASE 2 MG IJ SOLR
2.0000 mg | Freq: Once | INTRAMUSCULAR | Status: AC
  Administered 2012-01-27: 2 mg
  Filled 2012-01-27: qty 2

## 2012-01-27 MED ORDER — SODIUM CHLORIDE 0.9 % IV SOLN
1.0000 g | Freq: Three times a day (TID) | INTRAVENOUS | Status: DC
  Administered 2012-01-27: 1 g via INTRAVENOUS
  Filled 2012-01-27 (×3): qty 1

## 2012-01-27 NOTE — ED Notes (Signed)
Pt still waiting for IV team to assess PICC line.

## 2012-01-27 NOTE — ED Provider Notes (Signed)
Patient left with me at change of shift to have the IV team evaluate his PICC line. The IV team put TPA in patient's PICC line. It then functioned well. He decided to get the antibiotics in the ED before going home. His PIC line worked well.  Devoria Albe, MD, Armando Gang   Ward Givens, MD 01/27/12 2153

## 2012-01-27 NOTE — ED Notes (Signed)
[facility] called for pick up. 

## 2012-01-27 NOTE — ED Notes (Signed)
Per [name] IV team, PICC line is not to be used for 2 hours after tPA is flushed.  Pt aware.

## 2012-01-27 NOTE — ED Notes (Signed)
IV team contacted.  Spoke to [employer]

## 2012-01-27 NOTE — ED Notes (Signed)
Per EMS, pt from home, [facility] nurse reported that pt's PICC is blocked, was instructed by pt's PMD to have pt transported to the ED.  Pt denies any complaints at this time.
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
## 2012-01-30 LAB — CULTURE, BLOOD (ROUTINE X 2)

## 2012-02-07 ENCOUNTER — Emergency Department (HOSPITAL_COMMUNITY): Payer: Medicare Other

## 2012-02-07 ENCOUNTER — Emergency Department (HOSPITAL_COMMUNITY)
Admission: EM | Admit: 2012-02-07 | Discharge: 2012-02-08 | Disposition: A | Discharge status: Home / Self Care | Payer: Medicare Other | Attending: Emergency Medicine | Admitting: Emergency Medicine

## 2012-02-07 ENCOUNTER — Encounter (HOSPITAL_COMMUNITY): Payer: Self-pay | Admitting: Emergency Medicine

## 2012-02-07 DIAGNOSIS — Z792 Long term (current) use of antibiotics: Secondary | ICD-10-CM | POA: Insufficient documentation

## 2012-02-07 DIAGNOSIS — T82898A Other specified complication of vascular prosthetic devices, implants and grafts, initial encounter: Secondary | ICD-10-CM

## 2012-02-07 DIAGNOSIS — Y849 Medical procedure, unspecified as the cause of abnormal reaction of the patient, or of later complication, without mention of misadventure at the time of the procedure: Secondary | ICD-10-CM | POA: Insufficient documentation

## 2012-02-07 DIAGNOSIS — R609 Edema, unspecified: Secondary | ICD-10-CM | POA: Insufficient documentation

## 2012-02-07 DIAGNOSIS — M869 Osteomyelitis, unspecified: Secondary | ICD-10-CM | POA: Insufficient documentation

## 2012-02-07 DIAGNOSIS — M7989 Other specified soft tissue disorders: Secondary | ICD-10-CM | POA: Insufficient documentation

## 2012-02-07 DIAGNOSIS — G8250 Quadriplegia, unspecified: Secondary | ICD-10-CM | POA: Insufficient documentation

## 2012-02-07 LAB — BASIC METABOLIC PANEL
BUN: 14 mg/dL (ref 6–23)
CO2: 27 mEq/L (ref 19–32)
Chloride: 103 mEq/L (ref 96–112)
Creatinine, Ser: 0.43 mg/dL — ABNORMAL LOW (ref 0.50–1.35)
Potassium: 3.5 mEq/L (ref 3.5–5.1)

## 2012-02-07 LAB — CBC WITH DIFFERENTIAL/PLATELET
HCT: 25.2 % — ABNORMAL LOW (ref 39.0–52.0)
Hemoglobin: 8 g/dL — ABNORMAL LOW (ref 13.0–17.0)
Lymphocytes Relative: 25 % (ref 12–46)
Lymphs Abs: 2.5 10*3/uL (ref 0.7–4.0)
Monocytes Absolute: 1 10*3/uL (ref 0.1–1.0)
Monocytes Relative: 10 % (ref 3–12)
Neutro Abs: 5.7 10*3/uL (ref 1.7–7.7)
Neutrophils Relative %: 57 % (ref 43–77)
RBC: 3.44 MIL/uL — ABNORMAL LOW (ref 4.22–5.81)
WBC: 9.9 10*3/uL (ref 4.0–10.5)

## 2012-02-07 LAB — VANCOMYCIN, TROUGH: Vancomycin Tr: 9 ug/mL — ABNORMAL LOW (ref 10.0–20.0)

## 2012-02-07 MED ORDER — SODIUM CHLORIDE 0.9 % IV SOLN
Freq: Once | INTRAVENOUS | Status: AC
  Administered 2012-02-07: 12:00:00 via INTRAVENOUS

## 2012-02-07 MED ORDER — ALTEPLASE 100 MG IV SOLR
2.0000 mg | Freq: Once | INTRAVENOUS | Status: AC
  Administered 2012-02-07: 2 mg
  Filled 2012-02-07: qty 2

## 2012-02-07 MED ORDER — SODIUM CHLORIDE 0.9 % IV SOLN
1.0000 g | Freq: Three times a day (TID) | INTRAVENOUS | Status: DC
  Administered 2012-02-07 – 2012-02-08 (×2): 1 g via INTRAVENOUS
  Filled 2012-02-07 (×4): qty 1

## 2012-02-07 MED ORDER — VANCOMYCIN HCL IN DEXTROSE 1-5 GM/200ML-% IV SOLN
1000.0000 mg | Freq: Two times a day (BID) | INTRAVENOUS | Status: DC
  Administered 2012-02-08: 1000 mg via INTRAVENOUS
  Filled 2012-02-07 (×2): qty 200

## 2012-02-07 MED ORDER — INFLUENZA VIRUS VACC SPLIT PF IM SUSP
0.5000 mL | INTRAMUSCULAR | Status: AC
  Administered 2012-02-08: 0.5 mL via INTRAMUSCULAR
  Filled 2012-02-07 (×3): qty 0.5

## 2012-02-07 MED ORDER — VANCOMYCIN HCL 1000 MG IV SOLR
1500.0000 mg | Freq: Once | INTRAVENOUS | Status: AC
  Administered 2012-02-07: 1500 mg via INTRAVENOUS
  Filled 2012-02-07: qty 1500
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
## 2012-02-07 NOTE — ED Notes (Signed)
Per ems pt is from home. Alert and oriented x4. Pt has PICC line left arm, placed 2 weeks ago. picc placed for abx, potential osteomylitis. This morning rn came to do a blood draw, rn able to flush, but not able to draw back. Pt reports extremity looks more swollen. Denies pain, afebrile, no redness.

## 2012-02-07 NOTE — Progress Notes (Signed)
Left:  No evidence of DVT or superficial thrombosis.    

## 2012-02-07 NOTE — ED Notes (Signed)
IV team at bedside. She will come back in 1 hour to attempt to draw blood. PICC line cannot be used until IV rn comes back

## 2012-02-07 NOTE — Progress Notes (Signed)
Paged on call SW [place] [phone]

## 2012-02-07 NOTE — ED Notes (Signed)
IV team paged.  

## 2012-02-07 NOTE — Progress Notes (Signed)
No return call from sw at this time cm signing off

## 2012-02-07 NOTE — Progress Notes (Signed)
[facility] ED CM consulted by [facility] ED PA about transportation needs for pt to have return trip to get PICC line care on 925/13 CM to contact acting SW

## 2012-02-07 NOTE — Progress Notes (Signed)
[facility] ED CM spoke with suzanne who is actual acting on call SW ([phone]) about ED PA concerns with transportation back to IR for PICC issues on 02/08/12.  SW and CM reviewed case (Quadroplegia, blue medicare, advanced RN assisting in the home care, iv therapy for osteomyelitis, no medicaid listed) SW will consult SW supervisor and return call to EDP, [place] or PA, [employer] numbers for EDP and PA provided to the SW

## 2012-02-07 NOTE — ED Notes (Signed)
PA at bedside Pt alert and oriented x4. Respirations even and unlabored, bilateral symmetrical rise and fall of chest. Skin warm and dry. In no acute distress. Denies needs.   

## 2012-02-07 NOTE — ED Notes (Signed)
ZOX:[id number]<AV> Expected date:<BR> Expected time:11:11 AM<BR> Means of arrival:Ambulance<BR> Comments:<BR> picc line not drawing blood back

## 2012-02-07 NOTE — ED Notes (Signed)
Unsuccessfully attempted to obtain blood for labs.RN [place] made aware

## 2012-02-07 NOTE — ED Notes (Signed)
IV team paged. TPA at bedside.   Phlebotomy attempt blood draw, unsuccessful

## 2012-02-07 NOTE — ED Notes (Signed)
Pt brought over to room 30 with his personal sitter at bedside. Pt is alert and oriented x 4 and able to make needs known. Will continue to monitor.

## 2012-02-08 ENCOUNTER — Emergency Department (HOSPITAL_COMMUNITY): Payer: Medicare Other

## 2012-02-08 MED ORDER — LIDOCAINE HCL 1 % IJ SOLN
INTRAMUSCULAR | Status: AC
  Filled 2012-02-08: qty 20

## 2012-02-08 NOTE — ED Notes (Signed)
Interventional Radiology called regarding time for PICC line insertion. Stated that they would probably see him at 1000 today.

## 2012-02-08 NOTE — Progress Notes (Signed)
[facility] ED CM spoke with advanced home [facility] coordinator, Darl Pikes to update her that pt arrived in ED 02/07/12 for PICC line issues, transportation concerns from 02/07/12 and presently in IR getting PICC line issues corrected prior to d/c home.  Darl Pikes will notify her staff (RN, pharmacy, etc)

## 2012-02-08 NOTE — ED Notes (Signed)
Pt returned from IVR with new single lumen PICC in left upper arm.

## 2012-02-08 NOTE — ED Provider Notes (Signed)
The patient denies any complaints this morning. He is weak in the emergency room overnight for social reasons to have a PICC line placed this morning. Patient be discharged after that is completed  Celene Kras, MD 02/08/12 [phone]

## 2012-02-08 NOTE — ED Notes (Signed)
D/C orders received. Pt agreeable for d/c. [facility] called and made aware of need for transport home and address. PT states roommate will be home.

## 2012-02-08 NOTE — Procedures (Signed)
Successful exchange of (L)UE SL PICC line. Length 47cm, tip at SVC/RA  Ready for use No complications.  Brayton El PA-C 02/08/2012 12:03 PM

## 2012-02-08 NOTE — ED Notes (Signed)
Pt states he will give himself his 1400 dose of Merrem at home. Does not want to remain in ED for infusion. PT[facility] present for transfer home.

## 2012-02-08 NOTE — ED Notes (Signed)
Report received from [place], [place]. Pt has just been taken to IVR for PICC replacement.

## 2012-02-09 NOTE — ED Provider Notes (Signed)
Medical screening examination/treatment/procedure(s) were conducted as a shared visit with non-physician practitioner(s) and myself.  I personally evaluated the patient during the encounter   Loren Racer, MD 02/09/12 1506

## 2012-02-21 ENCOUNTER — Inpatient Hospital Stay: Payer: Medicare Other | Admitting: Infectious Diseases

## 2012-02-22 ENCOUNTER — Ambulatory Visit (INDEPENDENT_AMBULATORY_CARE_PROVIDER_SITE_OTHER): Payer: Medicare Other | Admitting: Infectious Diseases

## 2012-02-22 ENCOUNTER — Encounter: Payer: Self-pay | Admitting: Infectious Diseases

## 2012-02-22 VITALS — BP 102/71 | HR 76 | Temp 98.1°F

## 2012-02-22 DIAGNOSIS — L8990 Pressure ulcer of unspecified site, unspecified stage: Secondary | ICD-10-CM

## 2012-02-22 DIAGNOSIS — L089 Local infection of the skin and subcutaneous tissue, unspecified: Secondary | ICD-10-CM

## 2012-02-22 NOTE — Assessment & Plan Note (Addendum)
He is unable to get out of chair for exam. Will get records from his wound care center (not seen by Dr Kelly Splinter since July 2013). On calling them, he is seen there monthly.  His most recent labs show WBC 8.8, h/h 8.0/26.1. Vanco Tr 13.3  He has some concerns (as do I) that his bone infection is not healed. Will check CT scan of his pelvis to f/u on this. If persistent changes, consider PO anbx for long term.  Will see him back in 2-3 weeks.
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
## 2012-02-24 ENCOUNTER — Telehealth: Payer: Self-pay | Admitting: *Deleted

## 2012-02-24 NOTE — Telephone Encounter (Signed)
Called patient and left voice mail with CT appointment info. Scheduled for 02/29/12 at 9:45 AM, [facility] Radiology.  Asked that he call back confirming the appt. [name] [name] CMA

## 2012-02-27 ENCOUNTER — Encounter (HOSPITAL_BASED_OUTPATIENT_CLINIC_OR_DEPARTMENT_OTHER): Payer: Medicare Other

## 2012-02-29 ENCOUNTER — Ambulatory Visit (HOSPITAL_COMMUNITY)
Admission: RE | Admit: 2012-02-29 | Discharge: 2012-02-29 | Disposition: A | Discharge status: Home / Self Care | Payer: Medicare Other | Source: Ambulatory Visit | Attending: Infectious Diseases | Admitting: Infectious Diseases

## 2012-02-29 ENCOUNTER — Ambulatory Visit (HOSPITAL_COMMUNITY): Payer: Medicare Other

## 2012-02-29 DIAGNOSIS — M869 Osteomyelitis, unspecified: Secondary | ICD-10-CM | POA: Insufficient documentation

## 2012-02-29 DIAGNOSIS — L89209 Pressure ulcer of unspecified hip, unspecified stage: Secondary | ICD-10-CM | POA: Insufficient documentation

## 2012-02-29 DIAGNOSIS — K8020 Calculus of gallbladder without cholecystitis without obstruction: Secondary | ICD-10-CM | POA: Insufficient documentation

## 2012-02-29 DIAGNOSIS — M24459 Recurrent dislocation, unspecified hip: Secondary | ICD-10-CM | POA: Insufficient documentation

## 2012-02-29 DIAGNOSIS — L8990 Pressure ulcer of unspecified site, unspecified stage: Secondary | ICD-10-CM | POA: Insufficient documentation

## 2012-02-29 DIAGNOSIS — L089 Local infection of the skin and subcutaneous tissue, unspecified: Secondary | ICD-10-CM

## 2012-02-29 DIAGNOSIS — K449 Diaphragmatic hernia without obstruction or gangrene: Secondary | ICD-10-CM | POA: Insufficient documentation

## 2012-02-29 DIAGNOSIS — Z09 Encounter for follow-up examination after completed treatment for conditions other than malignant neoplasm: Secondary | ICD-10-CM | POA: Insufficient documentation

## 2012-02-29 MED ORDER — IOHEXOL 300 MG/ML  SOLN
100.0000 mL | Freq: Once | INTRAMUSCULAR | Status: AC | PRN
  Administered 2012-02-29: 100 mL via INTRAVENOUS

## 2012-03-02 ENCOUNTER — Encounter (HOSPITAL_COMMUNITY): Payer: Self-pay

## 2012-03-02 ENCOUNTER — Emergency Department (HOSPITAL_COMMUNITY)
Admission: EM | Admit: 2012-03-02 | Discharge: 2012-03-02 | Disposition: A | Discharge status: Home / Self Care | Payer: Medicare Other | Attending: Emergency Medicine | Admitting: Emergency Medicine

## 2012-03-02 DIAGNOSIS — I10 Essential (primary) hypertension: Secondary | ICD-10-CM | POA: Insufficient documentation

## 2012-03-02 DIAGNOSIS — L8990 Pressure ulcer of unspecified site, unspecified stage: Secondary | ICD-10-CM | POA: Insufficient documentation

## 2012-03-02 DIAGNOSIS — G4733 Obstructive sleep apnea (adult) (pediatric): Secondary | ICD-10-CM | POA: Insufficient documentation

## 2012-03-02 DIAGNOSIS — L8994 Pressure ulcer of unspecified site, stage 4: Secondary | ICD-10-CM | POA: Insufficient documentation

## 2012-03-02 DIAGNOSIS — G40909 Epilepsy, unspecified, not intractable, without status epilepticus: Secondary | ICD-10-CM | POA: Insufficient documentation

## 2012-03-02 DIAGNOSIS — Z466 Encounter for fitting and adjustment of urinary device: Secondary | ICD-10-CM

## 2012-03-02 DIAGNOSIS — G8250 Quadriplegia, unspecified: Secondary | ICD-10-CM | POA: Insufficient documentation

## 2012-03-02 DIAGNOSIS — Z950 Presence of cardiac pacemaker: Secondary | ICD-10-CM | POA: Insufficient documentation
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
## 2012-03-02 NOTE — ED Notes (Signed)
Wound Care RN at bedside to assess pt's decubitus wound on buttocks. Dsg on wounds changed by wound care RN. Plan of care discussed by wound care RN with pt.

## 2012-03-02 NOTE — Consult Note (Signed)
WOC consult Note Reason for Consult: eval wounds, bilateral ischial. Chronic Stage IV pressure ulcers, pt has had for at least 5 years. They are known to this WOC nurse from previous exposure in [name] and consultation in the hospital.  Pt in for dislodgement of his SP cath which has been resolved. MD wanted to have wound eval for VAC?  Pt has had this modality in the past but was unsuccessful due to location.  He is currently under the care of Dr. Wayland Denis at the [facility] and was recently found to have osteomyelitis at the site of the right ischial wound, tx at home with IV abtx.  And now to return for plastics (Dr. Kelly Splinter) to eval for some type of flap surgery. He is on bedrest for his ulcers.  They actually look the best this WOC nurse has seen them in some time. They are clean and free from surface infections, pink and some sites are oozing blood the wound edges appear open and there is some reepithelialization at some areas.   Wound type:chronic non healing Stage IV pressure ulcers bil. Ischial  Pressure Ulcer POA: Yes Measurement: i did not measure the areas as the pt was on ER stretcher which gave me limited exposure to the wound, and to avoid discomfort of the pt while turning.  Wound bed: both pink, clean, non granulating to the surface of the wound Drainage (amount, consistency, odor) minimal, no odor, no purulence noted Periwound: intact with no s/s of infection and noted scarring. Dressing procedure/placement/frequency: continue NS gauze dressing BID to the wound per [facility] or caregiver in the home. Limit time up on these wounds.   Dong Nimmons [place], [place] [phone]

## 2012-03-02 NOTE — ED Notes (Signed)
[facility] here to pick up pt.. 

## 2012-03-02 NOTE — ED Notes (Signed)
[facility] called to take pt back to his residence.  

## 2012-03-02 NOTE — ED Notes (Signed)
Patient states his home health nurse noticed that his suprapubic catheter came dislodged today.

## 2012-03-07 ENCOUNTER — Ambulatory Visit (INDEPENDENT_AMBULATORY_CARE_PROVIDER_SITE_OTHER): Payer: Medicare Other | Admitting: Infectious Diseases

## 2012-03-07 ENCOUNTER — Encounter: Payer: Self-pay | Admitting: Infectious Diseases

## 2012-03-07 VITALS — BP 101/68 | HR 81 | Temp 98.0°F

## 2012-03-07 DIAGNOSIS — L089 Local infection of the skin and subcutaneous tissue, unspecified: Secondary | ICD-10-CM

## 2012-03-07 DIAGNOSIS — L8990 Pressure ulcer of unspecified site, unspecified stage: Secondary | ICD-10-CM

## 2012-03-07 MED ORDER — DOXYCYCLINE HYCLATE 100 MG PO TABS: 100.0000 mg | ORAL_TABLET | Freq: Two times a day (BID) | ORAL | Status: DC
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
## 2012-03-07 NOTE — Assessment & Plan Note (Signed)
He is not able to get up for me to observe his wound. By his report it appears to be doing well. Will rely on wound care to keep [place] apprised of this. Will transition him to long term oral anbx at this time for his chronic osteo. Will see him back in 6 months.

## 2012-03-08 NOTE — Progress Notes (Signed)
PICC line removed from left upper extremity per Dr Ninetta Lights.  47 cm PICC removed from left upper extremity.  Dressing was intact with sutures present. Site was unremarkable.  Area cleansed with  chlorhexidine. Petroleum guaze dressing applied to site. Pt advised to leave dressing in place for 24 hours with no heavy lifting and if  dressing becomes blood soaked to call the office. Pt tolerated the procedure well.    Laurell Josephs, RN , BSN

## 2012-03-08 NOTE — Addendum Note (Signed)
Addended by: Laurell Josephs on: 03/08/2012 02:46 PM   Modules accepted: Orders

## 2012-03-12 ENCOUNTER — Encounter (HOSPITAL_BASED_OUTPATIENT_CLINIC_OR_DEPARTMENT_OTHER): Payer: Medicare Other | Attending: Plastic Surgery

## 2012-03-12 DIAGNOSIS — L8994 Pressure ulcer of unspecified site, stage 4: Secondary | ICD-10-CM | POA: Insufficient documentation

## 2012-03-12 DIAGNOSIS — L89309 Pressure ulcer of unspecified buttock, unspecified stage: Secondary | ICD-10-CM | POA: Insufficient documentation

## 2012-03-13 IMAGING — CR DG CHEST 1V PORT
1 series · 1 of 1 positions shown · non-contrast
Comparison: Chest x-ray most recent 06/20/2008.

CLINICAL DATA: Fever and weakness.  History of quadriplegia.

PORTABLE CHEST - 1 VIEW

[view not recorded]
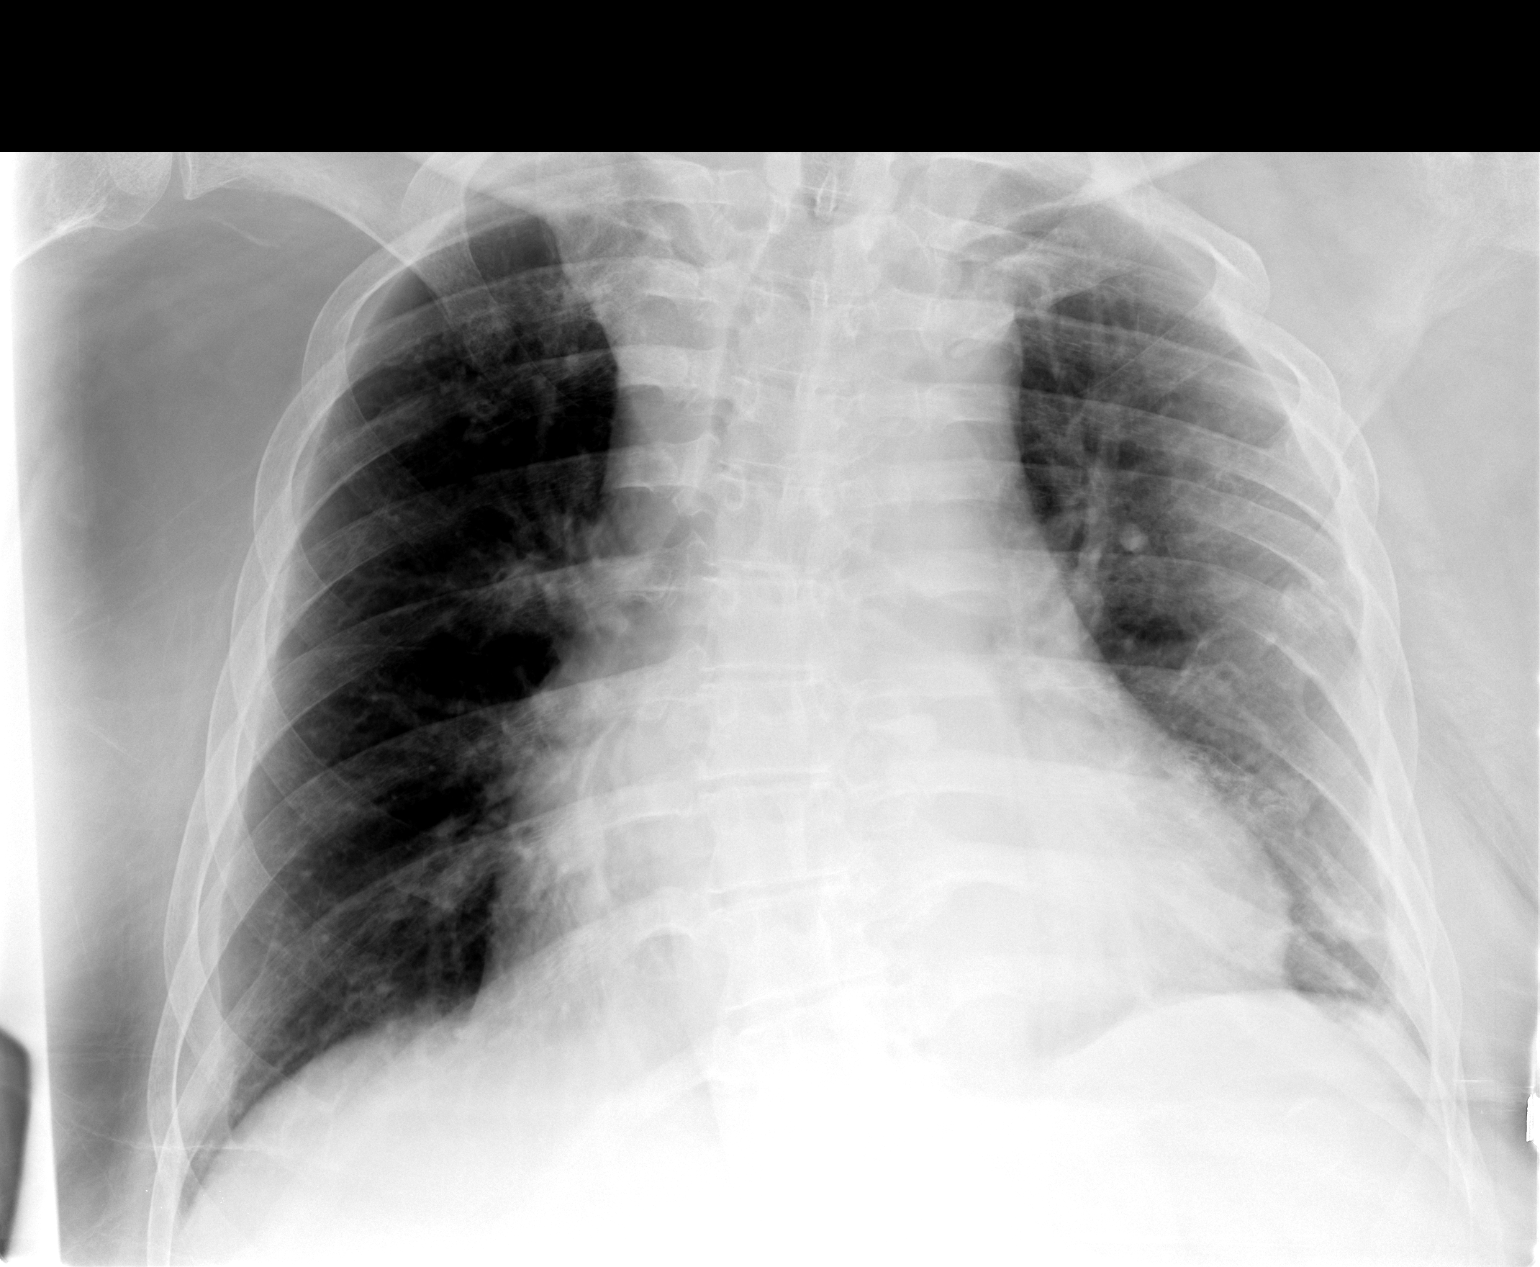

[1 of 1 positions shown; findings below may reference images not displayed]

FINDINGS: Multiple healed rib fractures on the left.  Cardiomegaly.
Moderate scoliosis.  Increased retrocardiac density is suspicious
for early pneumonia.  No effusion or pneumothorax.
IMPRESSION: Suspect left lower lobe pneumonia.  Cardiomegaly.  Worsening
aeration compared with priors.

## 2012-03-16 IMAGING — CR DG CHEST 1V PORT
1 series · 1 of 1 positions shown · non-contrast
Comparison: Earlier film of the same day

CLINICAL DATA: Central line placement

PORTABLE CHEST - 1 VIEW

[view not recorded]
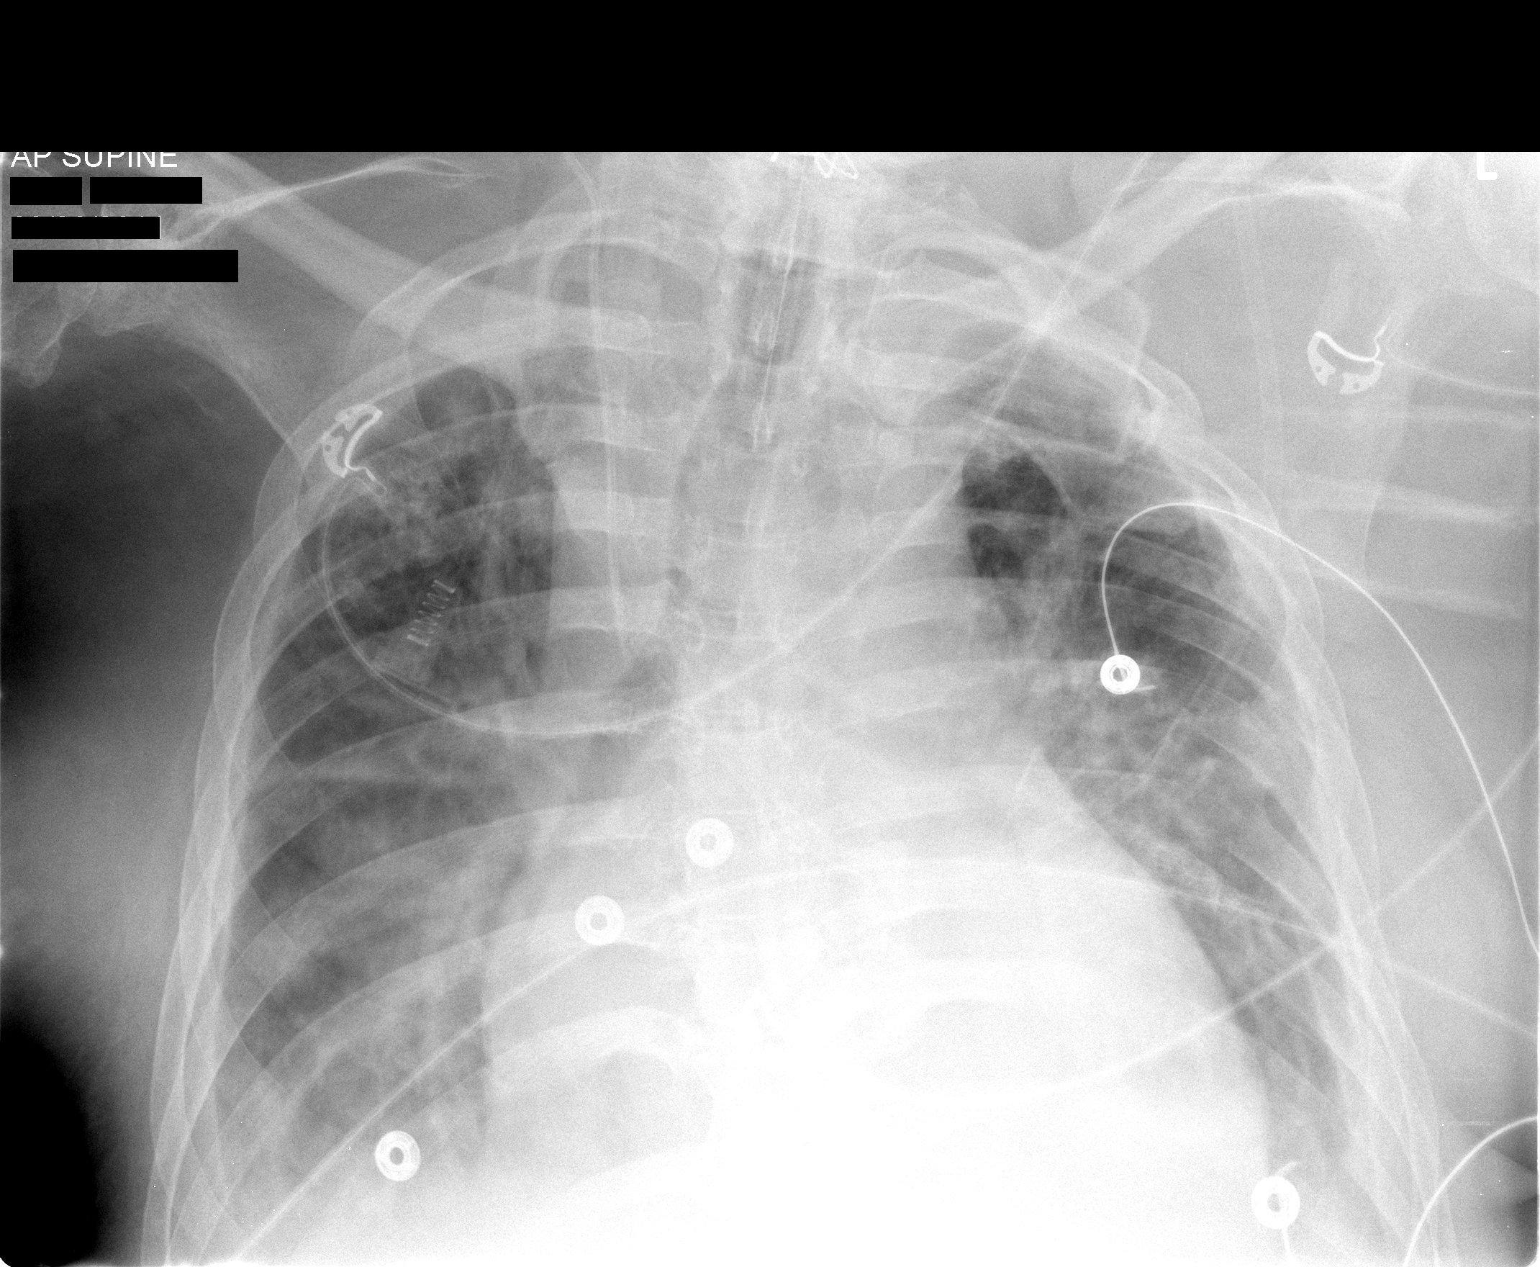

[1 of 1 positions shown; findings below may reference images not displayed]

FINDINGS: A  right IJ central lines been placed, tip in the mid
SVC.  No pneumothorax.  Endotracheal tube stable.  Persistent
bilateral pleural effusions.  Perihilar and bibasilar airspace
opacities persist.  The lateral costophrenic angles are excluded.
Heart size upper limits normal.
IMPRESSION: 1.  Central line placement to mid SVC without pneumothorax.
2.  Stable bilateral infiltrates/edema and effusions.

## 2012-03-16 IMAGING — CR DG CHEST 1V PORT
1 series · 1 of 1 positions shown · non-contrast
Comparison: Earlier film of the same day

CLINICAL DATA: Pneumonia, intubated

PORTABLE CHEST - 1 VIEW

[view not recorded]
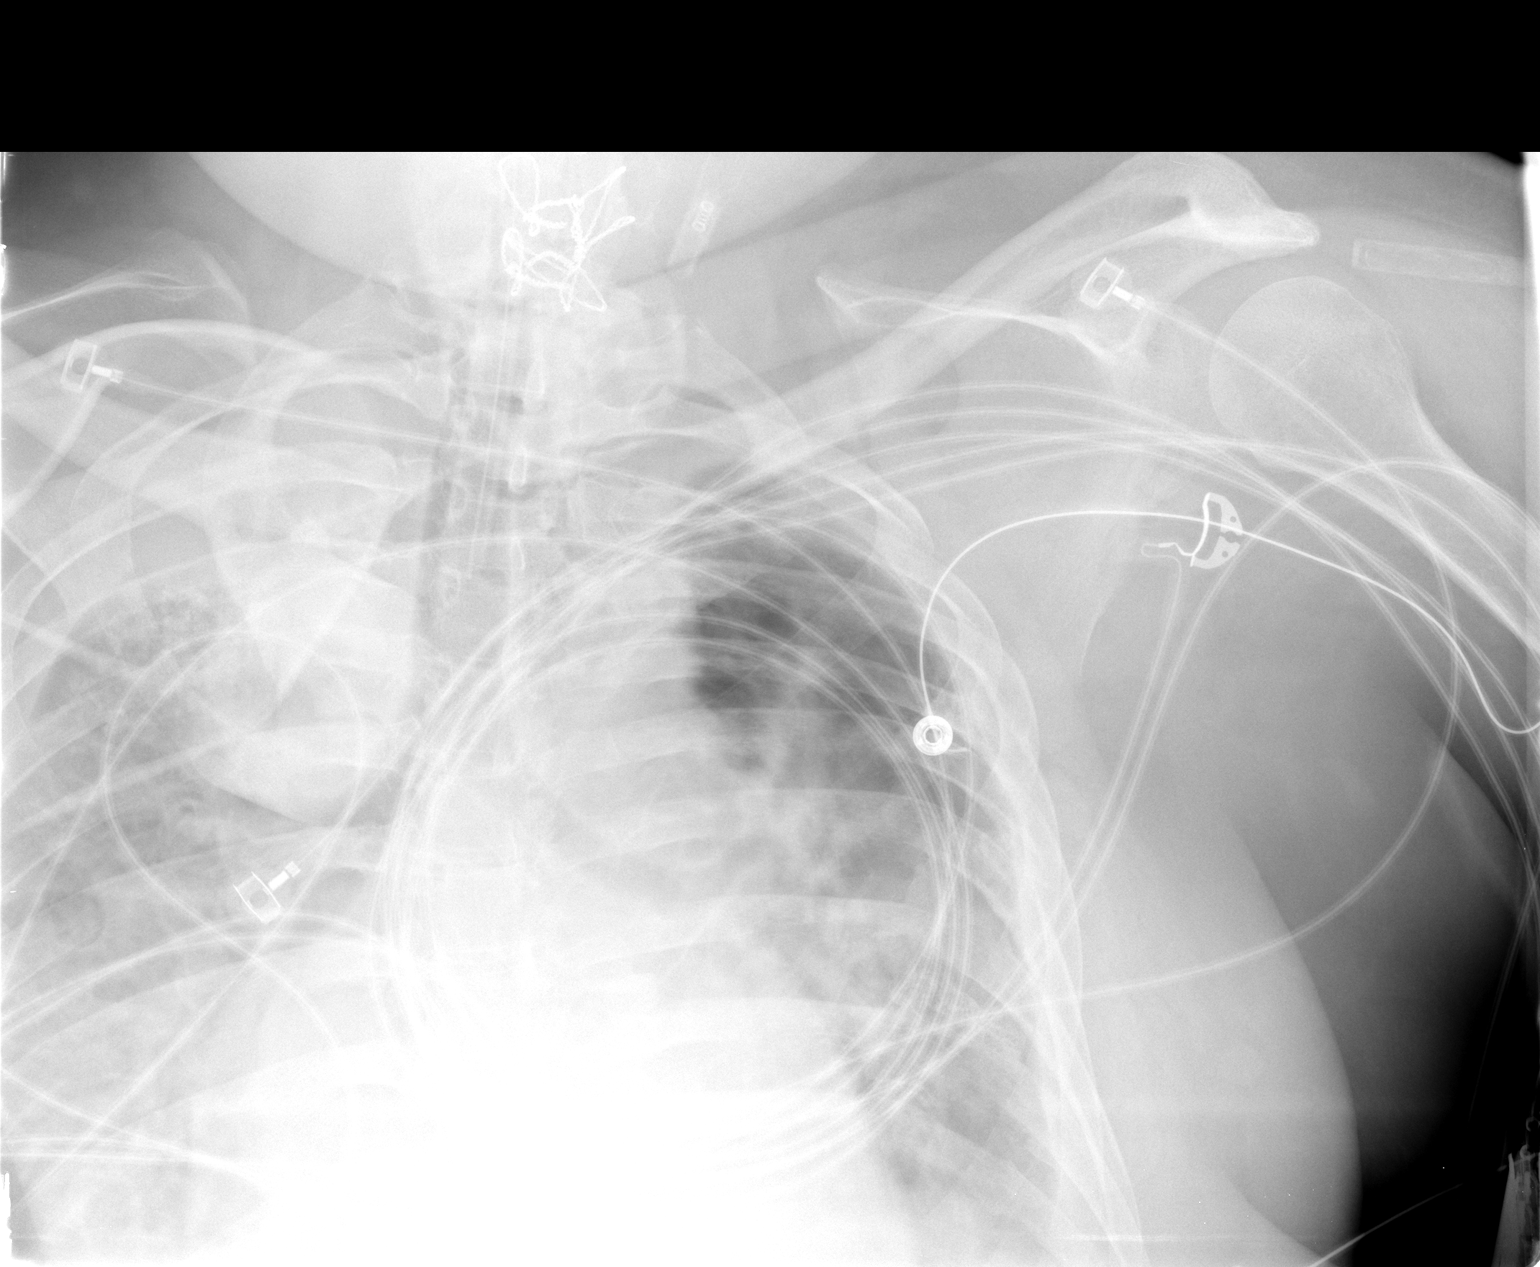

[1 of 1 positions shown; findings below may reference images not displayed]

FINDINGS: An endotracheal tube has been placed, tip 5.5 cm above
carina.  The lateral right hemithorax and lung bases have been
excluded.  There are bilateral pleural effusions or pleural
thickening, right greater than left.  There are bilateral airspace
opacities, right greater than left.  Heart size appears at least
upper limits normal.  Cervical fixation hardware is noted.
IMPRESSION: 1.  Endotracheal tube in expected location.
2.  Bilateral effusions and infiltrates, with limited visualization
as above.

## 2012-03-16 IMAGING — CR DG CHEST 1V PORT
2 series · 2 of 2 positions shown · non-contrast
Comparison: Portable chest x-ray of 07/30/2009

CLINICAL DATA: Severe shortness of breath, decreased oxygen
saturation

PORTABLE CHEST - 1 VIEW

[view not recorded (1 of 2)]
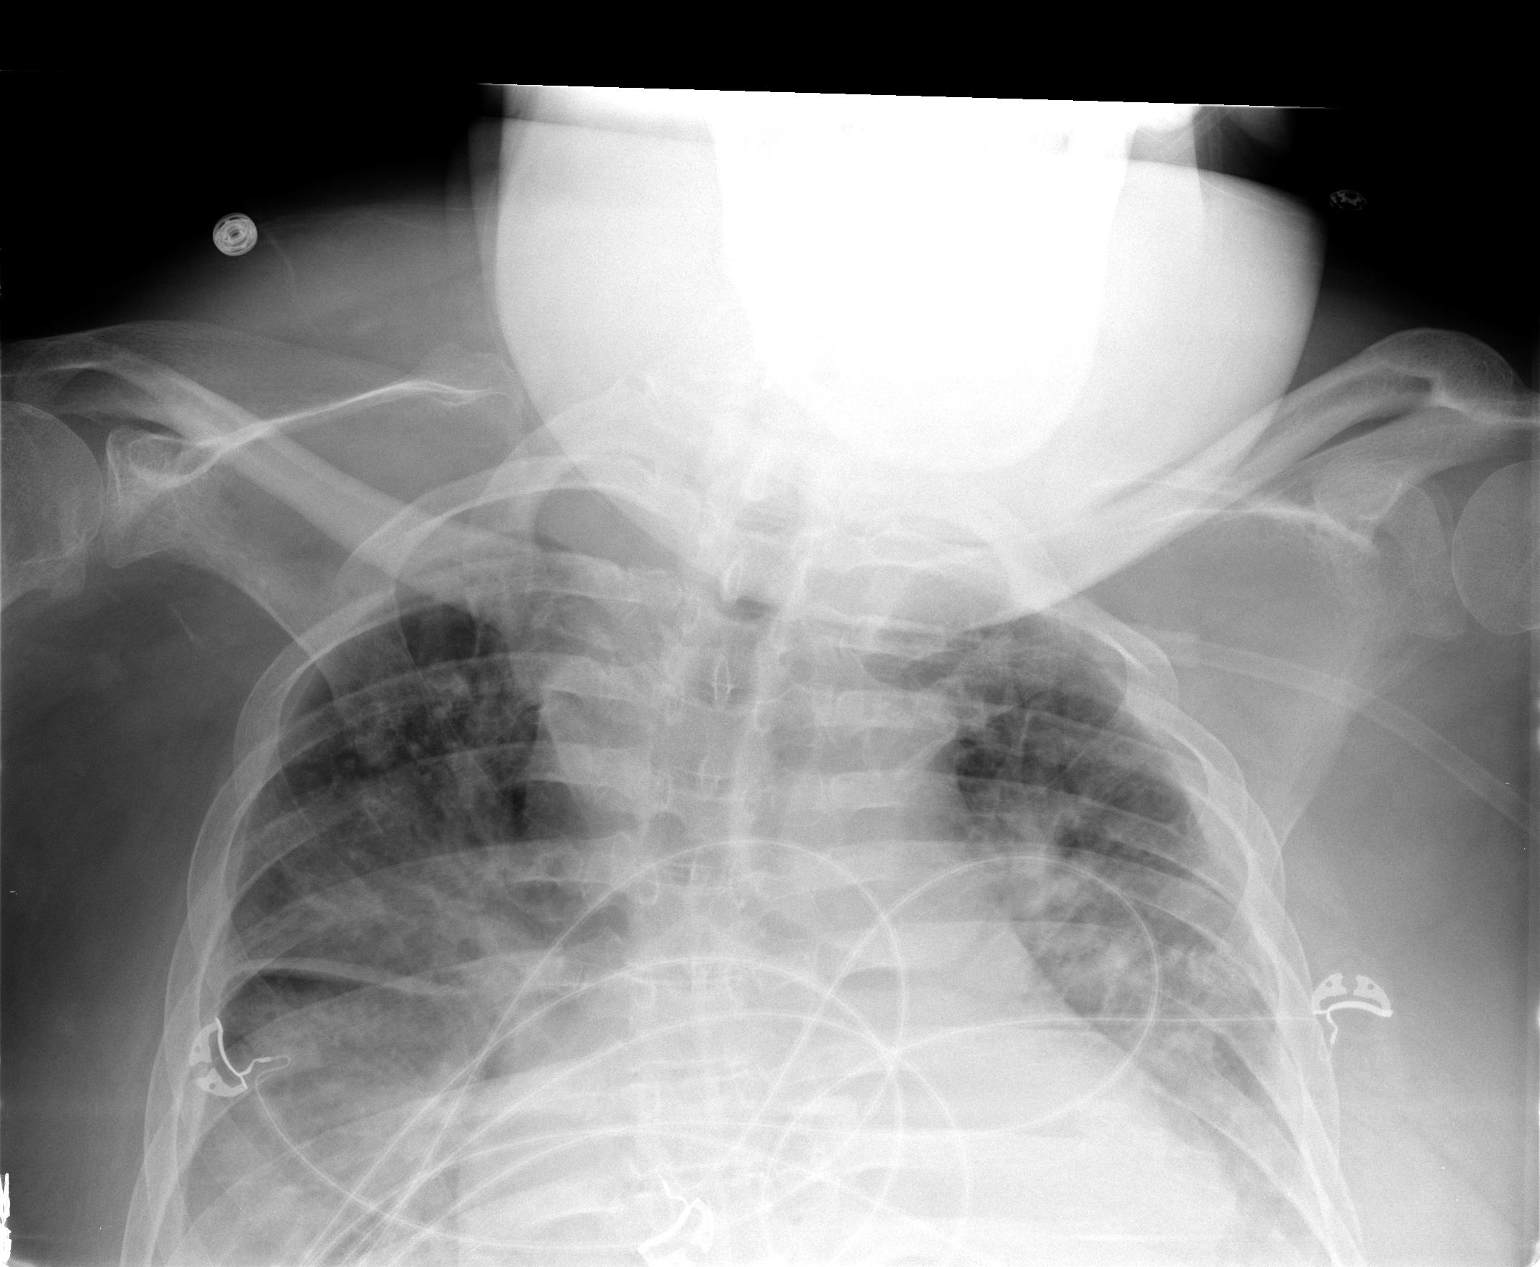

[view not recorded (2 of 2)]
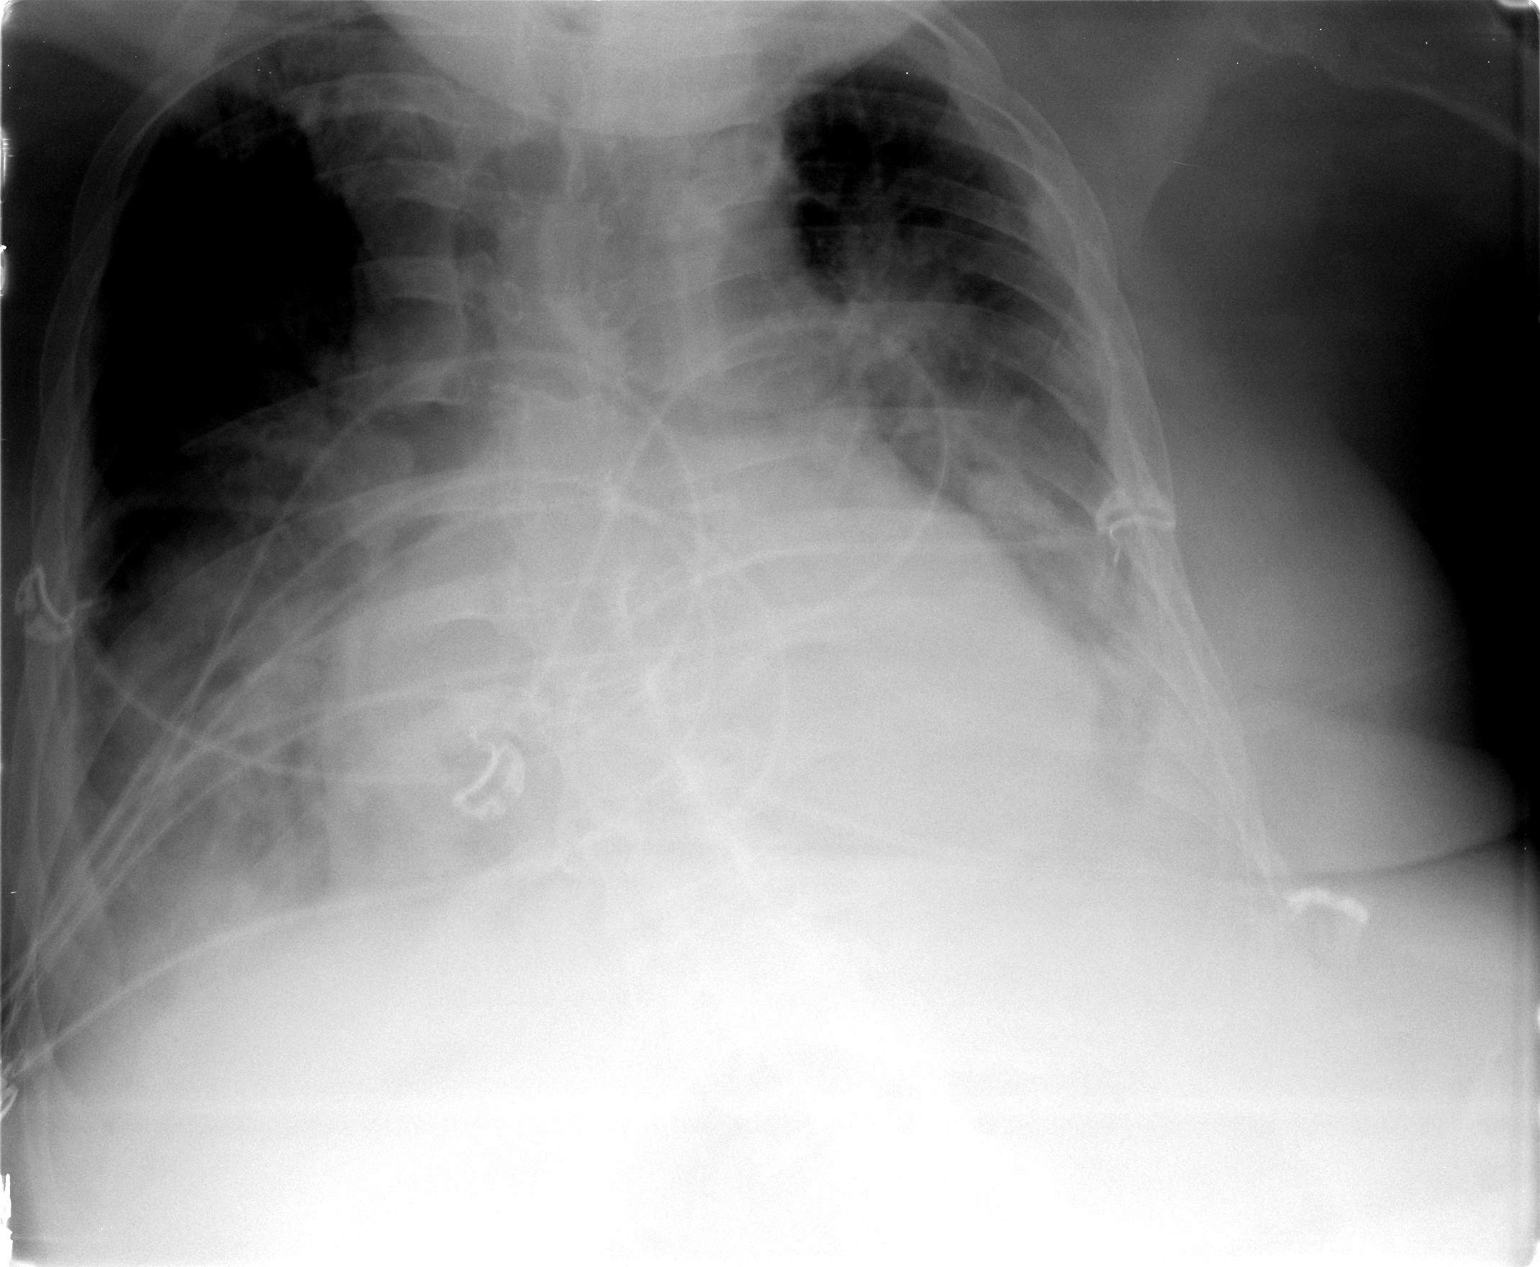

[2 of 2 positions shown; findings below may reference images not displayed]

FINDINGS: There has been interval development of airspace disease
primarily perihilar bilaterally most consistent with edema and
effusions.  Pneumonia cannot be excluded.  Cardiomegaly is stable.
Thoracic scoliosis is noted.
IMPRESSION: Interval development of primarily perihilar airspace disease with
effusions most consistent with edema and CHF.  Cannot exclude
pneumonia.

## 2012-03-17 ENCOUNTER — Encounter (HOSPITAL_COMMUNITY): Payer: Self-pay | Admitting: Emergency Medicine

## 2012-03-17 ENCOUNTER — Emergency Department (HOSPITAL_COMMUNITY): Payer: Medicare Other

## 2012-03-17 ENCOUNTER — Emergency Department (HOSPITAL_COMMUNITY)
Admission: EM | Admit: 2012-03-17 | Discharge: 2012-03-17 | Disposition: A | Discharge status: Home / Self Care | Payer: Medicare Other | Attending: Emergency Medicine | Admitting: Emergency Medicine

## 2012-03-17 DIAGNOSIS — Z8709 Personal history of other diseases of the respiratory system: Secondary | ICD-10-CM | POA: Insufficient documentation

## 2012-03-17 DIAGNOSIS — L8990 Pressure ulcer of unspecified site, unspecified stage: Secondary | ICD-10-CM | POA: Insufficient documentation

## 2012-03-17 DIAGNOSIS — L8994 Pressure ulcer of unspecified site, stage 4: Secondary | ICD-10-CM | POA: Insufficient documentation

## 2012-03-17 DIAGNOSIS — Z8719 Personal history of other diseases of the digestive system: Secondary | ICD-10-CM | POA: Insufficient documentation

## 2012-03-17 DIAGNOSIS — G4733 Obstructive sleep apnea (adult) (pediatric): Secondary | ICD-10-CM | POA: Insufficient documentation

## 2012-03-17 DIAGNOSIS — Z8619 Personal history of other infectious and parasitic diseases: Secondary | ICD-10-CM | POA: Insufficient documentation

## 2012-03-17 DIAGNOSIS — Z79899 Other long term (current) drug therapy: Secondary | ICD-10-CM | POA: Insufficient documentation

## 2012-03-17 DIAGNOSIS — D729 Disorder of white blood cells, unspecified: Secondary | ICD-10-CM | POA: Insufficient documentation

## 2012-03-17 DIAGNOSIS — Z9889 Other specified postprocedural states: Secondary | ICD-10-CM | POA: Insufficient documentation

## 2012-03-17 DIAGNOSIS — G8250 Quadriplegia, unspecified: Secondary | ICD-10-CM | POA: Insufficient documentation

## 2012-03-17 DIAGNOSIS — D649 Anemia, unspecified: Secondary | ICD-10-CM | POA: Insufficient documentation

## 2012-03-17 DIAGNOSIS — R9431 Abnormal electrocardiogram [ECG] [EKG]: Secondary | ICD-10-CM | POA: Insufficient documentation

## 2012-03-17 DIAGNOSIS — I10 Essential (primary) hypertension: Secondary | ICD-10-CM | POA: Insufficient documentation

## 2012-03-17 DIAGNOSIS — J45909 Unspecified asthma, uncomplicated: Secondary | ICD-10-CM | POA: Insufficient documentation

## 2012-03-17 DIAGNOSIS — Z87440 Personal history of urinary (tract) infections: Secondary | ICD-10-CM | POA: Insufficient documentation

## 2012-03-17 DIAGNOSIS — Z981 Arthrodesis status: Secondary | ICD-10-CM | POA: Insufficient documentation

## 2012-03-17 DIAGNOSIS — G40909 Epilepsy, unspecified, not intractable, without status epilepticus: Secondary | ICD-10-CM | POA: Insufficient documentation

## 2012-03-17 DIAGNOSIS — R609 Edema, unspecified: Secondary | ICD-10-CM | POA: Insufficient documentation

## 2012-03-17 DIAGNOSIS — Z8739 Personal history of other diseases of the musculoskeletal system and connective tissue: Secondary | ICD-10-CM | POA: Insufficient documentation

## 2012-03-17 DIAGNOSIS — Z8781 Personal history of (healed) traumatic fracture: Secondary | ICD-10-CM | POA: Insufficient documentation

## 2012-03-17 DIAGNOSIS — R0789 Other chest pain: Secondary | ICD-10-CM

## 2012-03-17 LAB — COMPREHENSIVE METABOLIC PANEL
ALT: 12 U/L (ref 0–53)
AST: 12 U/L (ref 0–37)
Alkaline Phosphatase: 89 U/L (ref 39–117)
CO2: 28 mEq/L (ref 19–32)
Calcium: 9.7 mg/dL (ref 8.4–10.5)
GFR calc non Af Amer: >90 mL/min (ref >90)
Glucose, Bld: 122 mg/dL — ABNORMAL HIGH (ref 70–99)
Potassium: 3.7 mEq/L (ref 3.5–5.1)
Sodium: 141 mEq/L (ref 135–145)
Total Protein: 8.8 g/dL — ABNORMAL HIGH (ref 6.0–8.3)

## 2012-03-17 LAB — CBC WITH DIFFERENTIAL/PLATELET
Basophils Absolute: 0 10*3/uL (ref 0.0–0.1)
Eosinophils Relative: 4 % (ref 0–5)
Lymphocytes Relative: 26 % (ref 12–46)
Lymphs Abs: 2.4 10*3/uL (ref 0.7–4.0)
MCV: 73.1 fL — ABNORMAL LOW (ref 78.0–100.0)
Neutrophils Relative %: 60 % (ref 43–77)
Platelets: 392 10*3/uL (ref 150–400)
RBC: 4.13 MIL/uL — ABNORMAL LOW (ref 4.22–5.81)
RDW: 17.9 % — ABNORMAL HIGH (ref 11.5–15.5)
WBC: 9.3 10*3/uL (ref 4.0–10.5)

## 2012-03-17 LAB — POCT I-STAT TROPONIN I: Troponin i, poc: 0.01 ng/mL (ref 0.00–0.08)

## 2012-03-17 MED ORDER — TECHNETIUM TO 99M ALBUMIN AGGREGATED
3.0000 | Freq: Once | INTRAVENOUS | Status: AC | PRN
  Administered 2012-03-17: 3 via INTRAVENOUS

## 2012-03-17 MED ORDER — NITROGLYCERIN 0.4 MG SL SUBL
0.4000 mg | SUBLINGUAL_TABLET | SUBLINGUAL | Status: DC | PRN
  Administered 2012-03-17: 0.4 mg via SUBLINGUAL
  Filled 2012-03-17: qty 25

## 2012-03-17 NOTE — ED Notes (Signed)
Called for food try.

## 2012-03-17 NOTE — ED Notes (Signed)
Patient complaining of chest pain that started around 0200 this morning (woke patient up from sleep).  Patient describes pain as a "dullness" in the center of his chest; denies radiation of pain.  Denies nausea, vomiting, shortness of breath, and weakness.  Reports diaphoresis.  Patient has swelling bilateral lower extremities; states that swelling is per his normal.  Patient denies being on any physical therapy for legs (patient is quadriplegic).  Denies being on Lasix (states that he was taken off this medication).  Patient alert and oriented x4; PERRL present.  Will continue to monitor.

## 2012-03-17 NOTE — ED Notes (Addendum)
Patient complaining of chest pain that awoken him around 0200 this morning.  Describes pain as dullness in center of chest; non-radiation.  Patient quadriplegic.  Rates pain 3/10.  Given 324 Aspirin and 1 SL nitro -- helped with pain.  Patient clammy, diaphoretic.  No IV started -- patient usually has PICC line.

## 2012-03-17 NOTE — ED Notes (Signed)
Plan of care: IV team unable to get IV; PA aware -- this is the reason VQ scan is being done in the morning.  Patient moved to CDU to wait for radiology to get here for VQ scan in the morning (around 0700).  Patient reports being chest pain free at this time; given call bell.  Denies any other needs at this time.  Will continue to monitor.

## 2012-03-17 NOTE — ED Notes (Signed)
PA and x-ray at bedside.
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
## 2012-03-17 NOTE — ED Notes (Signed)
Attempted PIV  X 2times no success, plan is for PICC line today.

## 2012-03-17 NOTE — ED Provider Notes (Signed)
[name] [name] is a 46 y.o. male in CDU from [facility]. Signout from Dr. Hyacinth Meeker as follows: Pt is essentially a quadriplegic and obese with chest pain and elevated D-Dimer (2.2). Non-ischemic ECG and first Trop is negative. Plan is to insert PICC line and obtain VQ scan (IV access not appropriate for CTA) and obtain a second troponin.  Patient seen and examined at the bedside he is resting comfortably. In no acute distress. Patient denies any pain at this time. Shortness of breath, or palpitations. Lung exam shows no advantageous sounds however it is limited by habitus. Patient is morbidly obese. Heart is regular rate and rhythm, abdominal exam is benign.   Date: 03/17/2012  Rate: 101  Rhythm: sinus tachycardia  QRS Axis: normal  Intervals: normal  ST/T Wave abnormalities: normal  Conduction Disutrbances:left anterior fascicular block  Narrative Interpretation:   Old EKG Reviewed: unchanged   IV placed by  Ultrasound guidance by IV tech.   VQ scan is low probability an interval troponin is negative:  Results for orders placed during the hospital encounter of 03/17/12  CBC WITH DIFFERENTIAL      Component Value Range   WBC 9.3  4.0 - 10.5 K/uL   RBC 4.13 (*) 4.22 - 5.81 MIL/uL   Hemoglobin 9.2 (*) 13.0 - 17.0 g/dL   HCT 16.1 (*) 09.6 - 04.5 %   MCV 73.1 (*) 78.0 - 100.0 fL   MCH 22.3 (*) 26.0 - 34.0 pg   MCHC 30.5  30.0 - 36.0 g/dL   RDW 40.9 (*) 81.1 - 91.4 %   Platelets 392  150 - 400 K/uL   Neutrophils Relative 60  43 - 77 %   Neutro Abs 5.5  1.7 - 7.7 K/uL   Lymphocytes Relative 26  12 - 46 %   Lymphs Abs 2.4  0.7 - 4.0 K/uL   Monocytes Relative 10  3 - 12 %   Monocytes Absolute 0.9  0.1 - 1.0 K/uL   Eosinophils Relative 4  0 - 5 %   Eosinophils Absolute 0.4  0.0 - 0.7 K/uL   Basophils Relative 0  0 - 1 %   Basophils Absolute 0.0  0.0 - 0.1 K/uL  COMPREHENSIVE METABOLIC PANEL      Component Value Range   Sodium 141  135 - 145 mEq/L   Potassium 3.7  3.5 - 5.1 mEq/L   Chloride  103  96 - 112 mEq/L   CO2 28  19 - 32 mEq/L   Glucose, Bld 122 (*) 70 - 99 mg/dL   BUN 19  6 - 23 mg/dL   Creatinine, Ser 7.82  0.50 - 1.35 mg/dL   Calcium 9.7  8.4 - 95.6 mg/dL   Total Protein 8.8 (*) 6.0 - 8.3 g/dL   Albumin 3.3 (*) 3.5 - 5.2 g/dL   AST 12  0 - 37 U/L   ALT 12  0 - 53 U/L   Alkaline Phosphatase 89  39 - 117 U/L   Total Bilirubin 0.1 (*) 0.3 - 1.2 mg/dL   GFR calc non Af Amer >90  >90 mL/min   GFR calc Af Amer >90  >90 mL/min  D-DIMER, QUANTITATIVE      Component Value Range   D-Dimer, Quant 2.24 (*) 0.00 - 0.48 ug/mL-FEU  POCT I-STAT TROPONIN I      Component Value Range   Troponin i, poc 0.02  0.00 - 0.08 ng/mL   Comment 3  POCT I-STAT TROPONIN I      Component Value Range   Troponin i, poc 0.01  0.00 - 0.08 ng/mL   Comment 3            Nm Pulmonary Perfusion  03/17/2012  *RADIOLOGY REPORT*  Clinical Data:  Chest pain. Quadrant lesion.  Decubitus ulcers. High risk for pulmonary embolism.  NUCLEAR MEDICINE PERFUSION LUNG SCAN  Technique:  Perfusion images were obtained in multiple projections after intravenous injection of radiopharmaceutical.  Radiopharmaceutical:  3.3 mCi Tc-38m MAA.  Comparison:  None.  Findings: Uniform distribution radiopharmaceutical activity is seen throughout both lungs.  No segmental pulmonary perfusion defects identified in either lung.  IMPRESSION: Negative.  No evidence for pulmonary embolism.   Original Report Authenticated By: Myles Rosenthal, M.D.    Ct Abdomen Pelvis W Contrast  02/29/2012  *RADIOLOGY REPORT*  Clinical Data: Follow up infected decubitus ulcer with osteomyelitis of pelvis and femoral heads  CT ABDOMEN AND PELVIS WITH CONTRAST  Technique:  Multidetector CT imaging of the abdomen and pelvis was performed following the standard protocol during bolus administration of intravenous contrast.  Contrast: OMNIPAQUE IOHEXOL 300 MG/ML  SOLN  Comparison: 01/18/2012  Findings: Dysmorphic, chronically dislocated right hip.   Associated decubitus ulcer (series 2/image 86) with overlying cortical irregularity (series 2/image 85), likely reflecting acute on chronic osteomyelitis.  Osseous sclerosis involving the bilateral ischial tuberosity (series 2/image 84), unchanged, likely reflecting at least chronic osteomyelitis.  Mildly heterogeneous sclerosis of the remaining pelvis, nonspecific, although noting stable irregularity of the right iliac crest (series 2/image 66).  Dysmorphic left hip (series 2/image 78), unchanged, without definite superimposed findings to suggest acute osteomyelitis.  Indwelling suprapubic bladder catheter, although possibly mildly withdrawn with its balloon in the region of the lower abdominal musculature (sagittal image 79).  However, the distal tip is likely still present within the bladder.  Additional findings: --mild dependent atelectasis in the left lower lobe --tiny hiatal hernia --cholelithiasis, without associated inflammatory changes --left lower quadrant colostomy  IMPRESSION: Suspected acute on chronic osteomyelitis of the right hip, grossly unchanged.  Associated decubitus ulcer.  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  Possible mildly malpositioned/withdrawn suprapubic bladder catheter, as above.   Original Report Authenticated By: Charline Bills, M.D.    Dg Chest Portable 1 View  03/17/2012  *RADIOLOGY REPORT*  Clinical Data: Chest pain.  PORTABLE CHEST - 1 VIEW  Comparison: 02/07/2012  Findings: Shallow inspiration.  Mild cardiac enlargement with normal pulmonary vascularity.  Patchy calcifications projected over the right upper lung correspond to soft tissue calcifications on previous chest CT from 10/26/2010.  Increased density in the right apex may represent pleural thickening and is stable since previous study.  Old left rib fractures.  Thoracic scoliosis convex towards the right.  No focal airspace consolidation.  No blunting of costophrenic  angles.  No pneumothorax.  Interval removal of left PICC catheter.  Postoperative changes in the cervical spine.  IMPRESSION: No evidence of active pulmonary disease.  Chronic changes as discussed.  Mild cardiac enlargement.   Original Report Authenticated By: Burman Nieves, M.D.    Filed Vitals:   03/17/12 0535 03/17/12 0717 03/17/12 0800 03/17/12 1044  BP: 106/71 128/104 111/59 152/90  Pulse: 94 87 84 76  Temp:      TempSrc:      Resp: 16     SpO2: 99% 97% 97% 94%     VSS Pt is CP free.  Pt verbalized understanding and agrees with care  plan. Outpatient follow-up and return precautions given.      Wynetta Emery, PA-C 03/17/12 1310

## 2012-03-17 NOTE — ED Notes (Signed)
Patient currently sitting up in bed; no respiratory or acute distress noted.  IV team and PA currently at bedside; will continue to monitor.

## 2012-03-17 NOTE — ED Notes (Signed)
PA currently at bedside. 

## 2012-03-17 NOTE — ED Notes (Signed)
[facility] called for patient transport back home.

## 2012-03-17 NOTE — ED Notes (Signed)
Phlebotomy at bedside attempting blood draw; unsuccessful at this time.  RN attempted IV start x2; another RN attempted; unsuccessful.  IV team called for IV start.

## 2012-03-17 NOTE — ED Provider Notes (Signed)
Pt with hx of quadriplegia, no hx of htn, dm, cholesterol or tob abuse and no DVt - presents with intermittent CP last night - CP free since meds in ED and ASA.  Has no sob or tachy, had high d dimer - on exam has gross edema bil LE's, and clear heart and lung sounds with normal arterial pulses at radial arteries.  ECG non ischemic, r/o PE / ACS with second set of markers.  Medical screening examination/treatment/procedure(s) were conducted as a shared visit with non-physician practitioner(s) and myself.  I personally evaluated the patient during the encounter    Vida Roller, MD 03/17/12 [phone]

## 2012-03-18 IMAGING — CR DG CHEST 1V PORT
1 series · 1 of 1 positions shown · non-contrast
Comparison: Earlier the same date.

CLINICAL DATA: Pneumonia.  Endotracheal tube placement.  Air leak.

PORTABLE CHEST - 1 VIEW

[view not recorded]
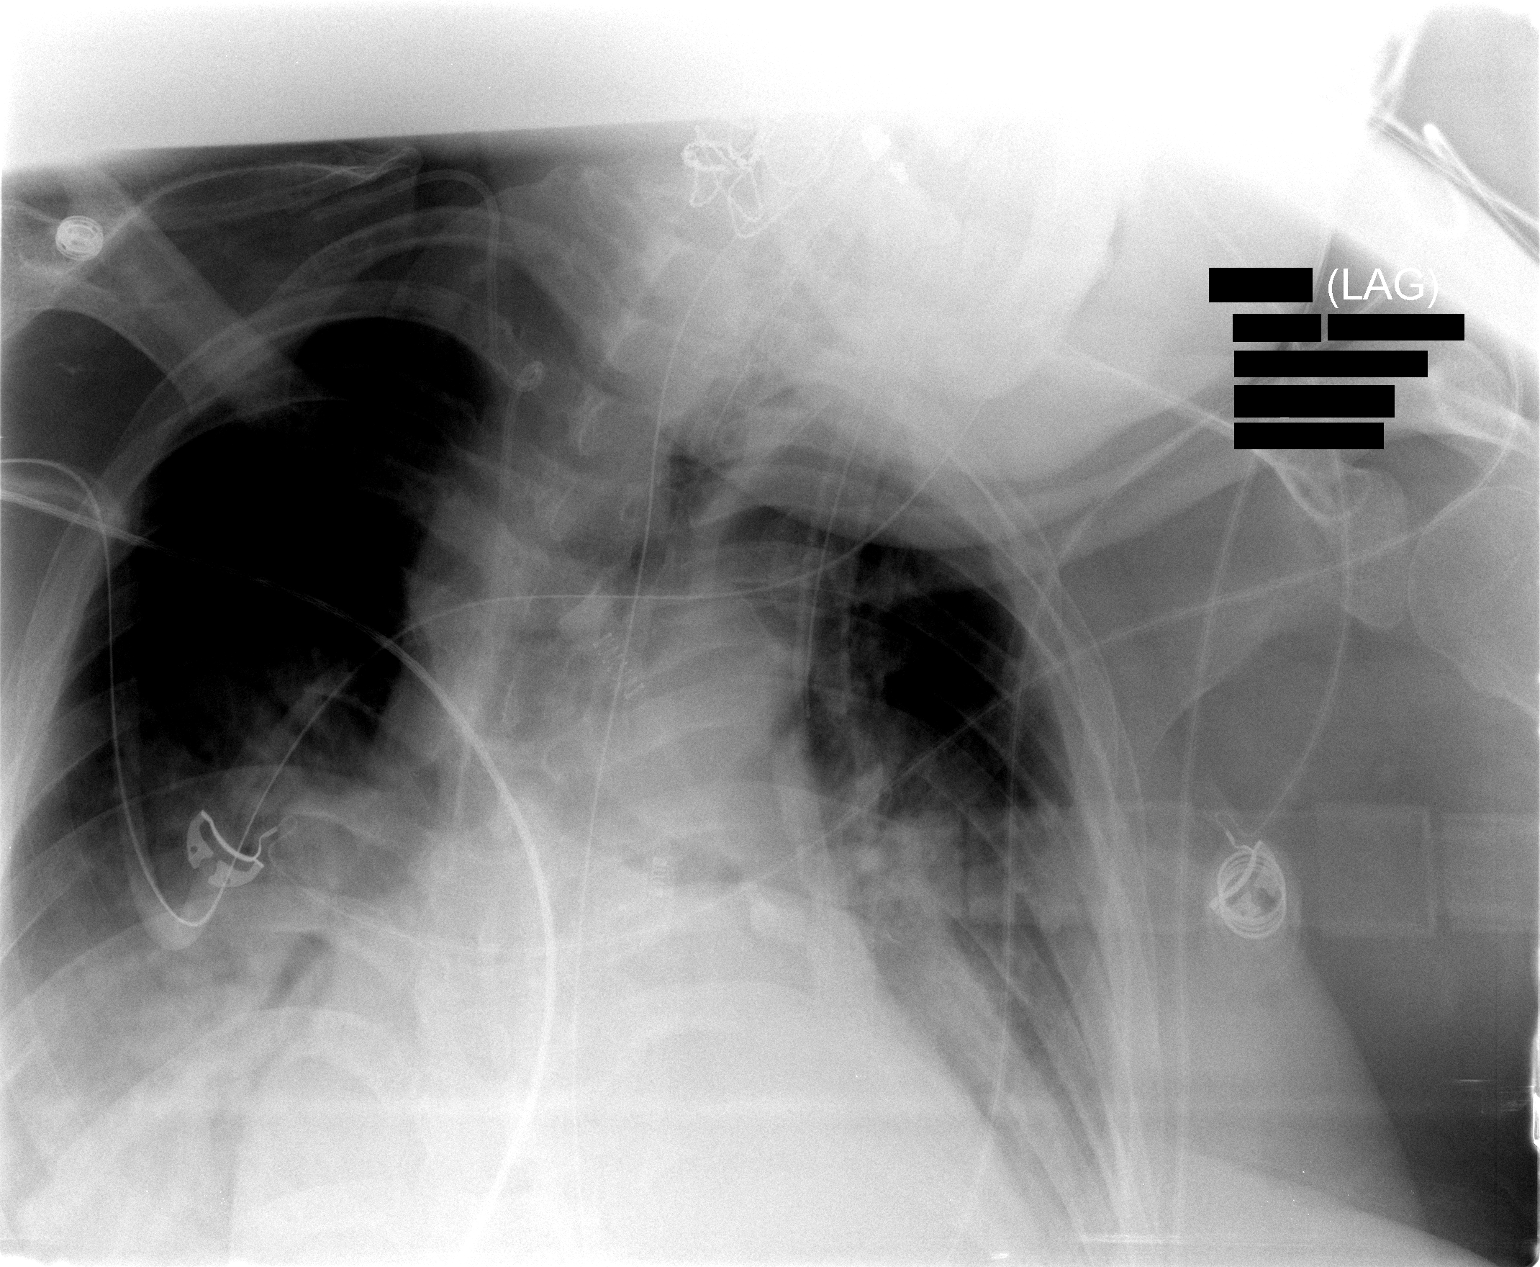

[1 of 1 positions shown; findings below may reference images not displayed]

FINDINGS: 9477 hours.  Endotracheal tube projects below the
thoracic inlet, well above the carina.  Central line and
nasogastric tube are in place.  There is patient rotation to the
left.  Allowing for this, no significant change in the bilateral
air space opacities is identified.  There are probable small
bilateral pleural effusions.  No pneumothorax is evident.  The
heart size and mediastinal contours are stable.
IMPRESSION: Satisfactorily positioned support system.  No demonstrated
pneumothorax or change in bilateral air space opacities.

## 2012-03-19 ENCOUNTER — Encounter: Payer: Self-pay | Admitting: Internal Medicine

## 2012-03-19 IMAGING — CR DG CHEST 1V PORT
1 series · 1 of 1 positions shown · non-contrast
Comparison: 07/07/2009

CLINICAL DATA: Ventilatory support, endotracheal tube repositioned

PORTABLE CHEST - 1 VIEW

[view not recorded]
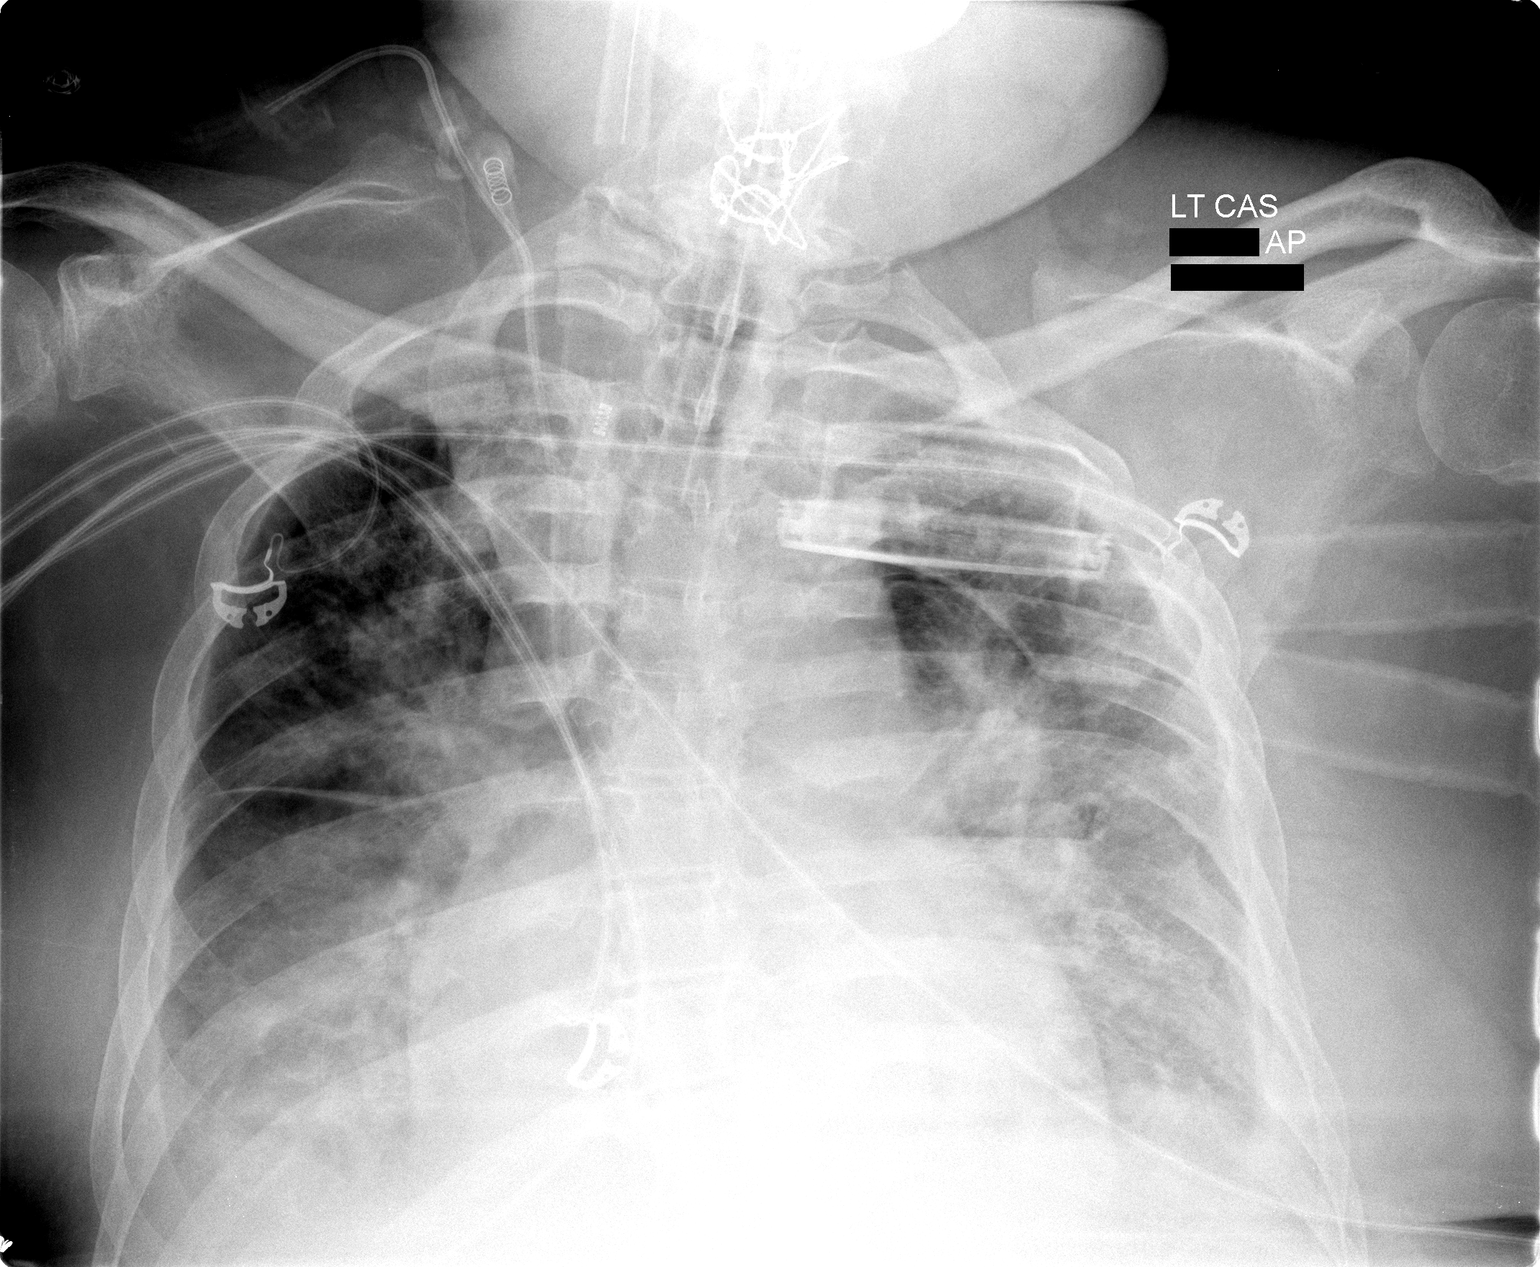

[1 of 1 positions shown; findings below may reference images not displayed]

FINDINGS: Endotracheal tube is 6 cm above the carina.  Heart is
enlarged.  Diffuse patchy airspace disease versus edema.  Pleural
effusions noted bilaterally.  No large pneumothorax.
IMPRESSION: Endotracheal tube 6 cm above the carina.
Stable airspace disease versus edema and effusions

## 2012-03-19 NOTE — ED Provider Notes (Signed)
Pt of Dr Hyacinth Meeker  Suzi Roots, MD 03/19/12 [phone]

## 2012-03-20 IMAGING — CR DG CHEST 1V PORT
1 series · 1 of 1 positions shown · non-contrast
Comparison: 08/05/2009.

CLINICAL DATA: Pneumonia.  SIRS

PORTABLE CHEST - 1 VIEW

[view not recorded]
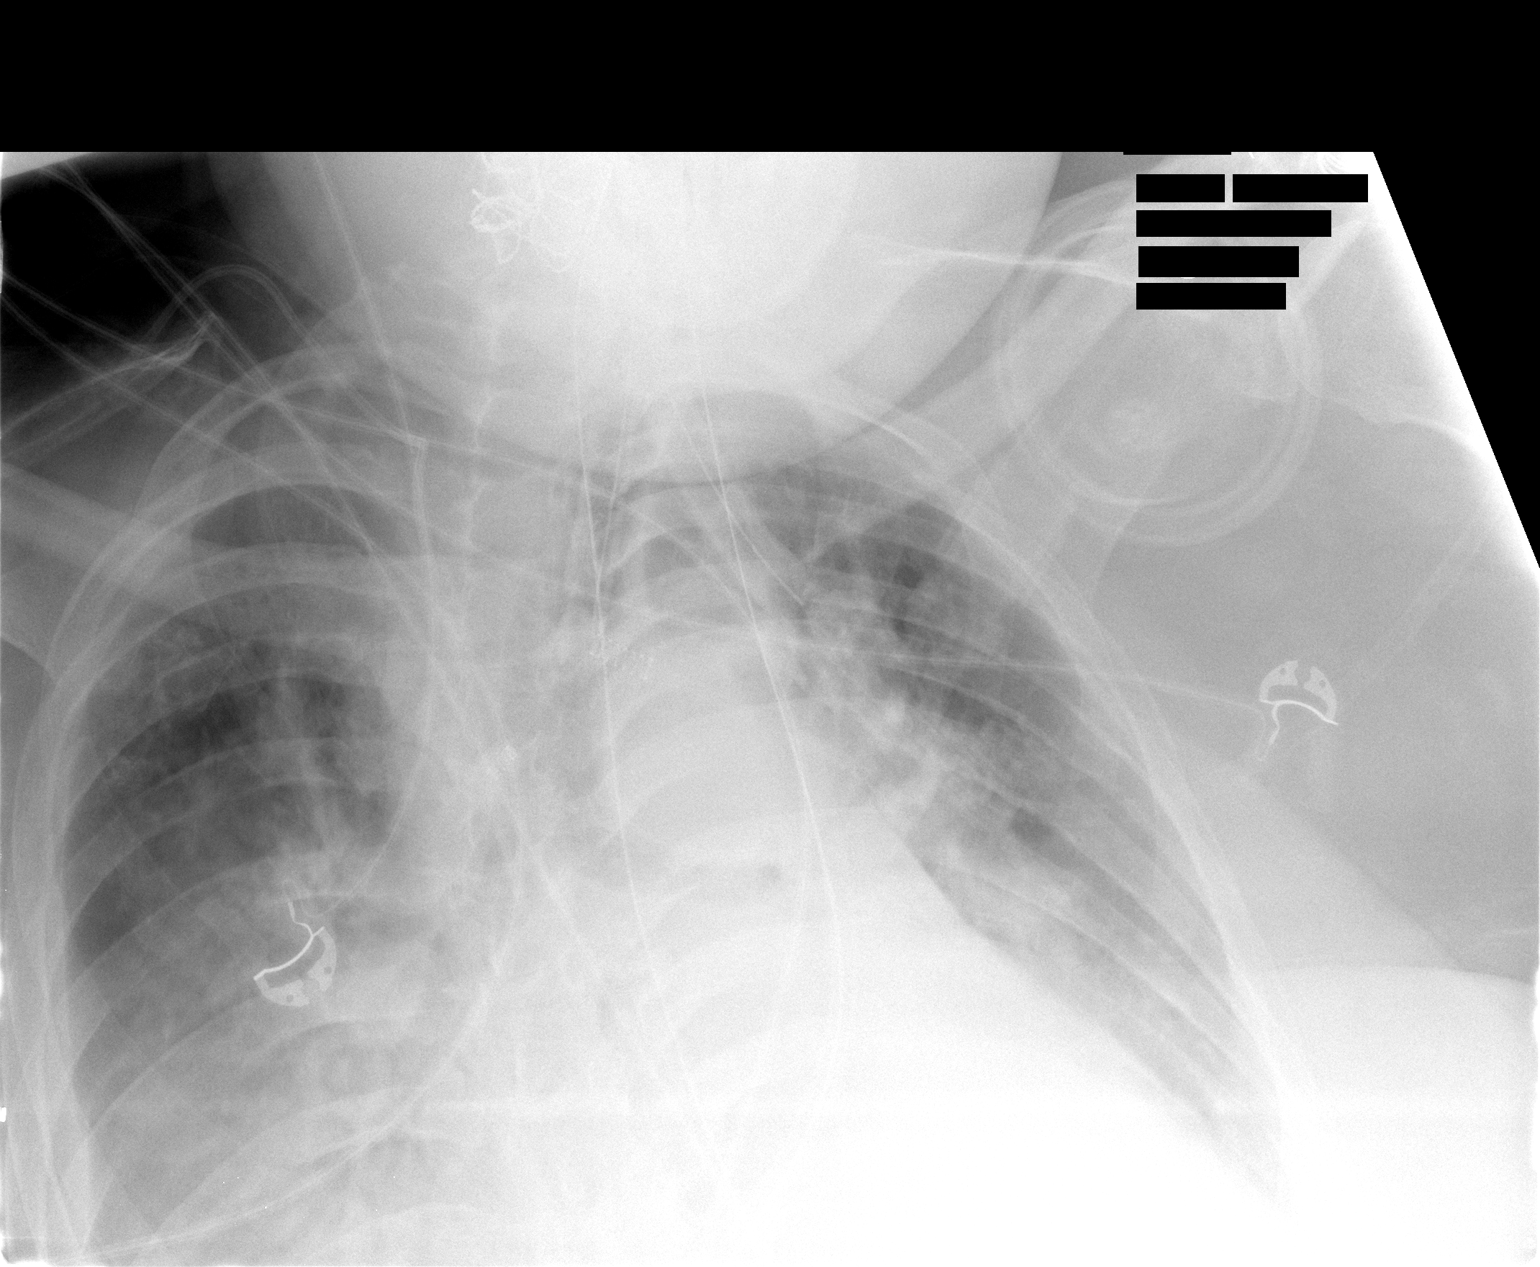

[1 of 1 positions shown; findings below may reference images not displayed]

FINDINGS: Stable bilateral airspace disease.  Left lower lobe
atelectasis / consolidation is appreciated.  Small pleural
effusions.  ET tube position satisfactory.
IMPRESSION: Vascular congestion airspace disease and small pleural effusions
plus left lower lobe atelectasis / consolidation persist.

## 2012-03-20 IMAGING — CR DG CHEST 1V PORT
2 series · 2 of 2 positions shown · non-contrast
Comparison: 08/06/2009 at [DATE] a.m.

CLINICAL DATA: Pneumonia.  Some others.  Respiratory distress.
Endotracheal tube placement.

PORTABLE CHEST - 1 VIEW

[view not recorded (1 of 2)]
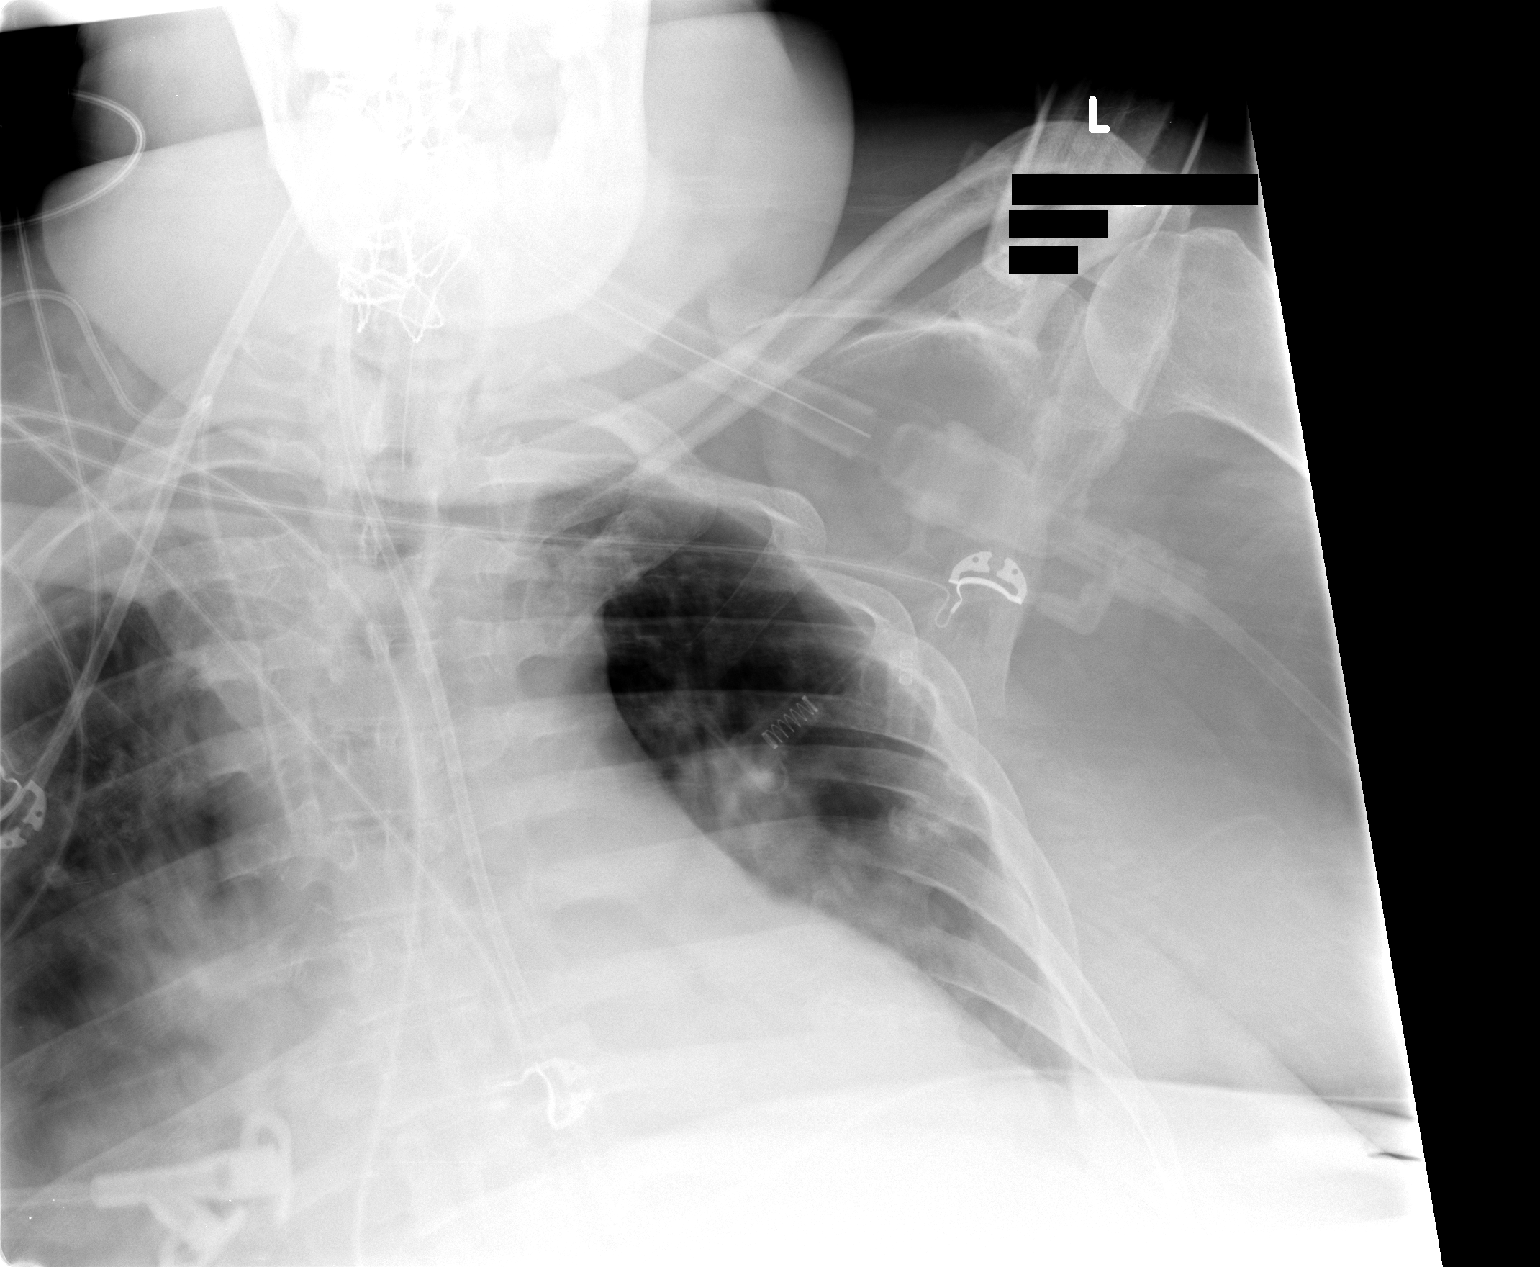

[view not recorded (2 of 2)]
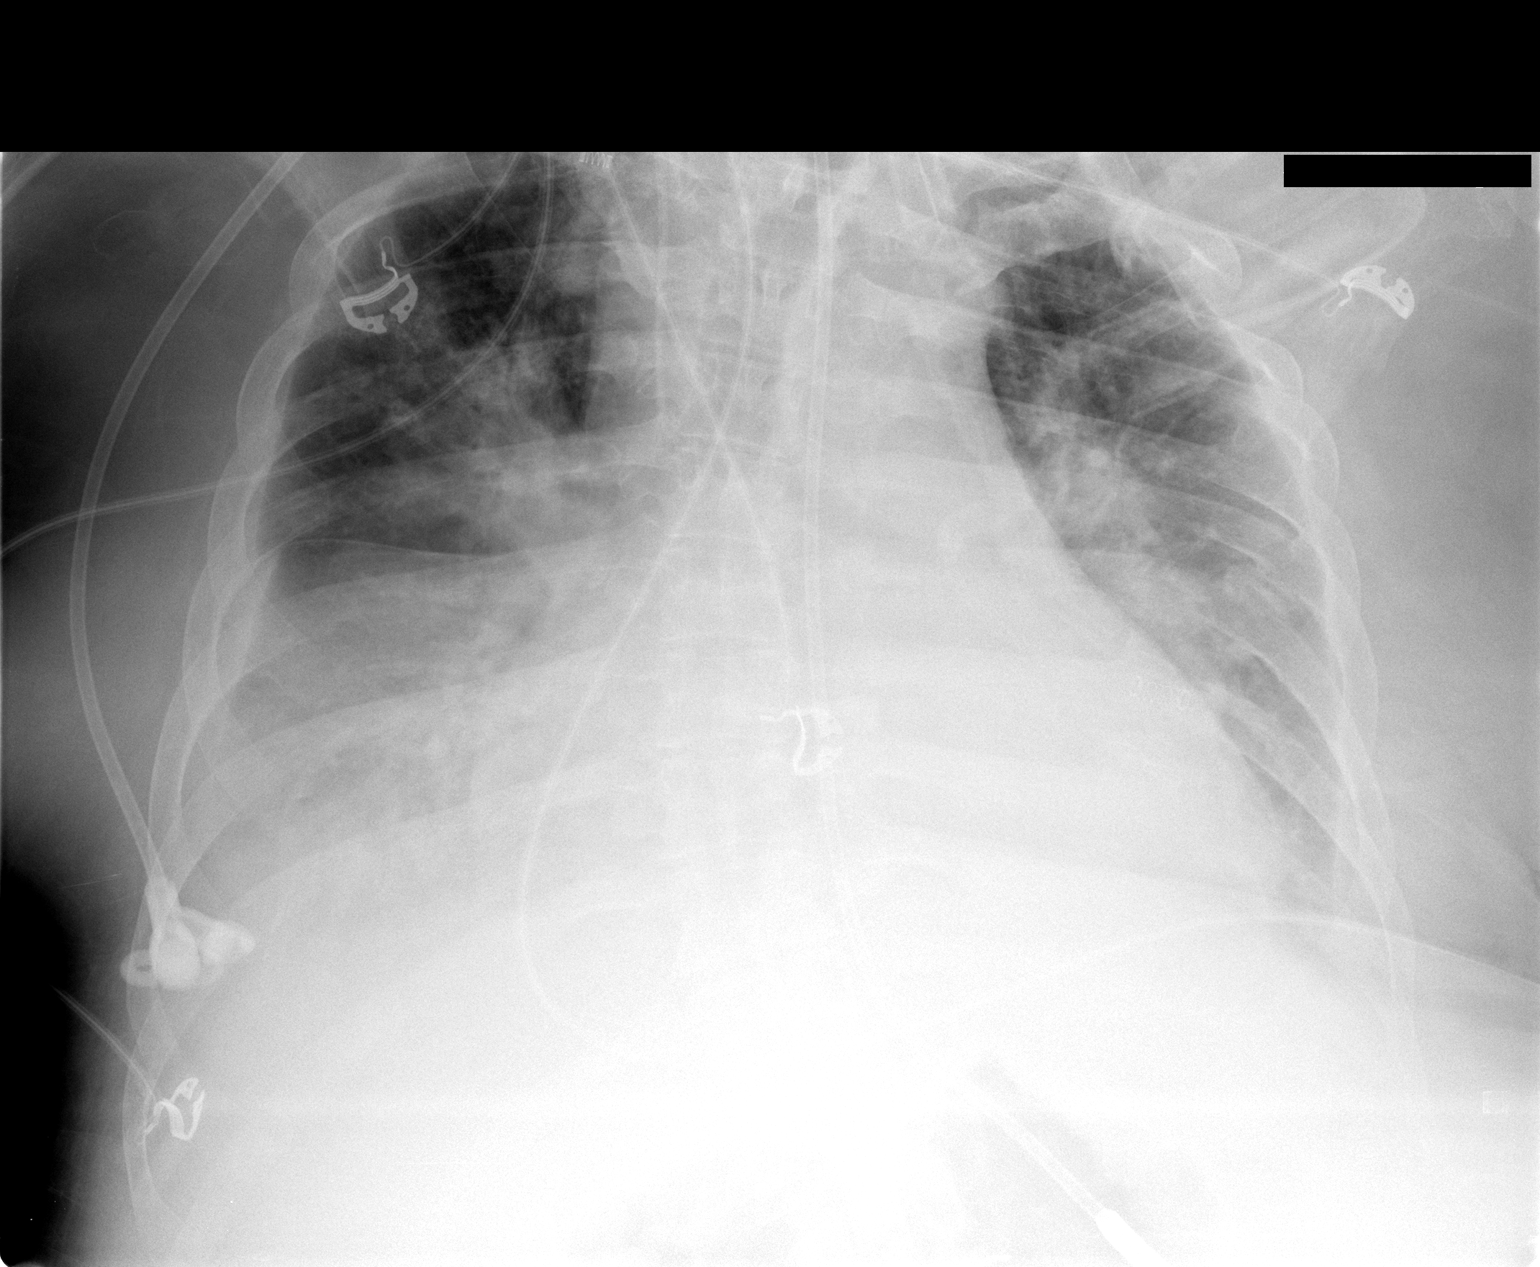

[2 of 2 positions shown; findings below may reference images not displayed]

FINDINGS: The patient is now intubated.  The endotracheal tube
terminates the level of T1-2, 10 cm above the carina.  A right IJ
line is in place.  A small bore feeding tube terminates in the
fundus of the stomach.  Bilateral pleural effusions are again
noted, worse on the right.  Bibasilar airspace disease is also
worse on the right.  There is no definite interval change in the
airspace disease. Diffuse interstitial prominence is also noted.
IMPRESSION: 1.  The endotracheal tube terminates at the level of T1-2,
approximately 10 cm above the carina.
2.  Persistent bilateral pleural effusions and airspace disease,
right worse than left.
3.  Bilateral edematous changes, suggesting possible congestive
heart failure.

## 2012-03-20 IMAGING — CR DG ABD PORTABLE 1V
1 series · 1 of 1 positions shown · non-contrast
Comparison: 10/25/2007

CLINICAL DATA: Feeding tube placement.

ABDOMEN - 1 VIEW

[view not recorded]
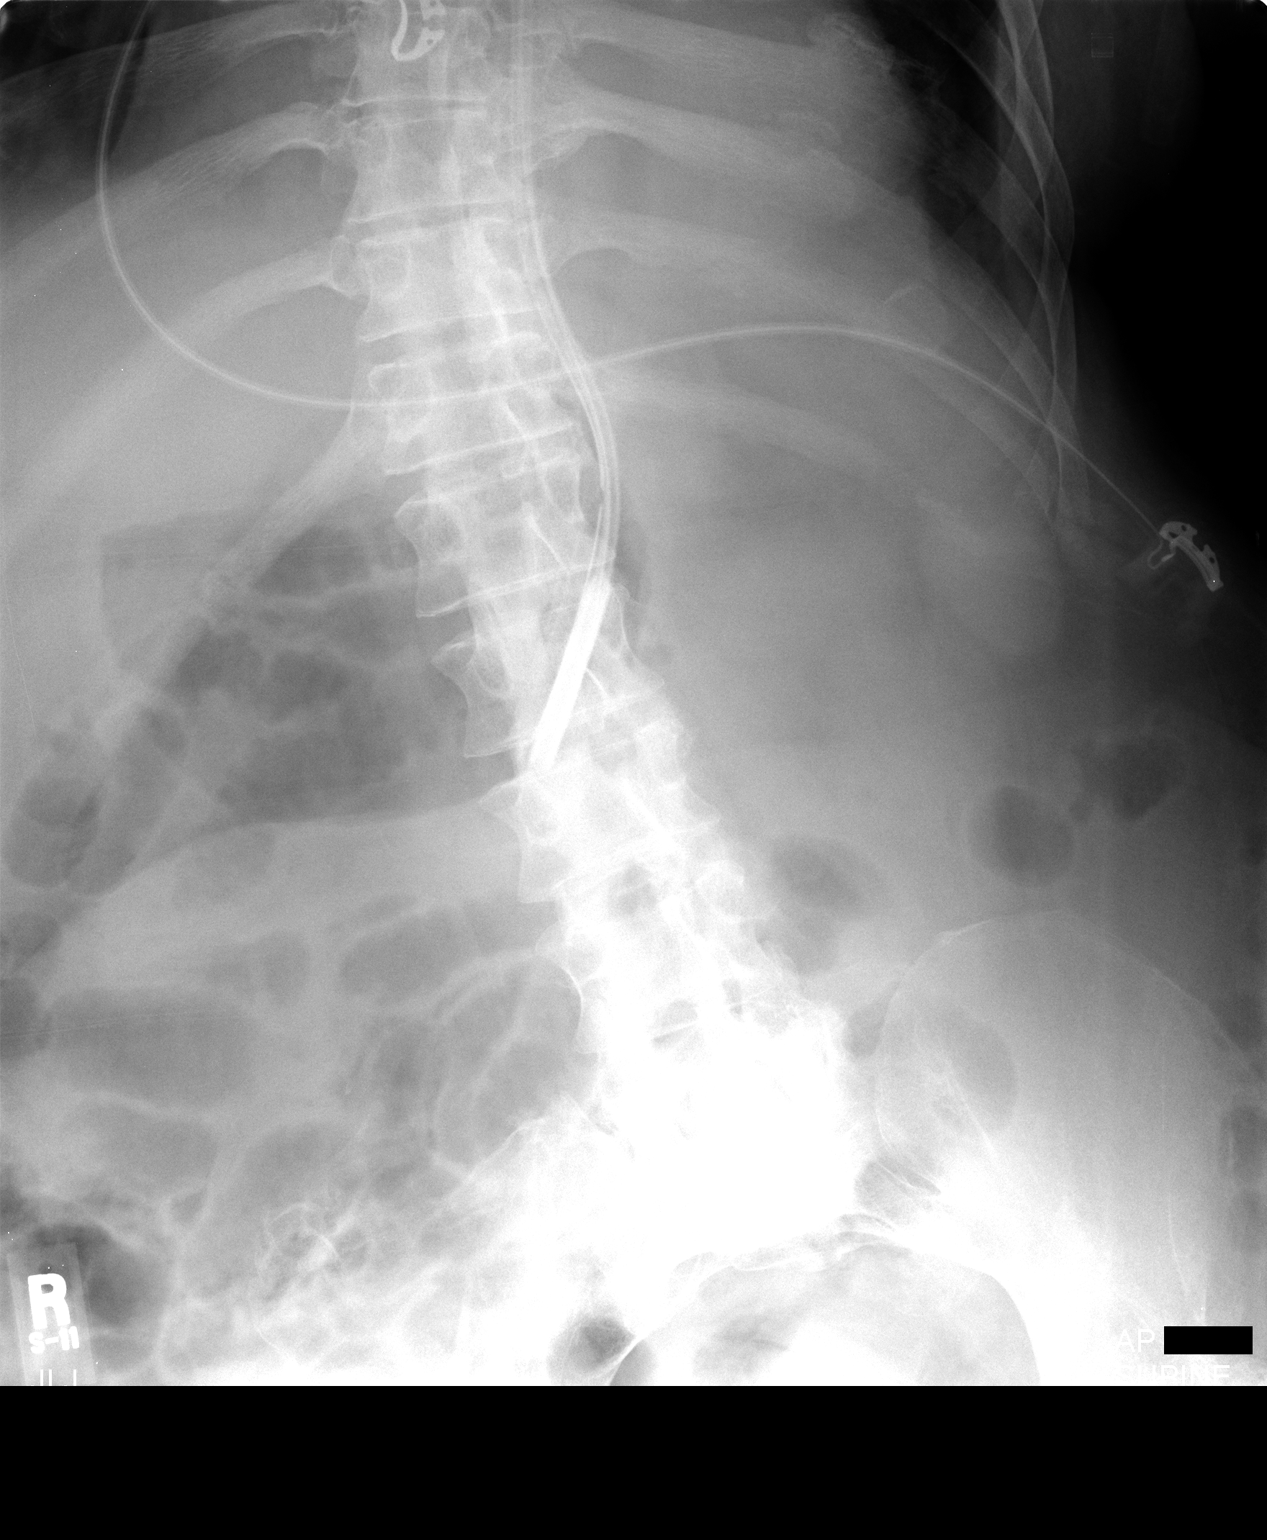

[1 of 1 positions shown; findings below may reference images not displayed]

FINDINGS: Nasogastric tube and soft feeding tube are in place, both
within the stomach with the tips in the region of the midportion.
Bowel gas pattern appears unremarkable.
IMPRESSION: Nasogastric tube and soft feeding tube have their tips in the
midportion of the stomach.

## 2012-03-21 ENCOUNTER — Encounter: Payer: Self-pay | Admitting: Infectious Diseases

## 2012-03-21 IMAGING — CR DG CHEST 1V PORT
1 series · 1 of 1 positions shown · non-contrast
Comparison: 08/06/2009 and earlier

CLINICAL DATA: Pneumonia/on ventilator

PORTABLE CHEST - 1 VIEW

[view not recorded]
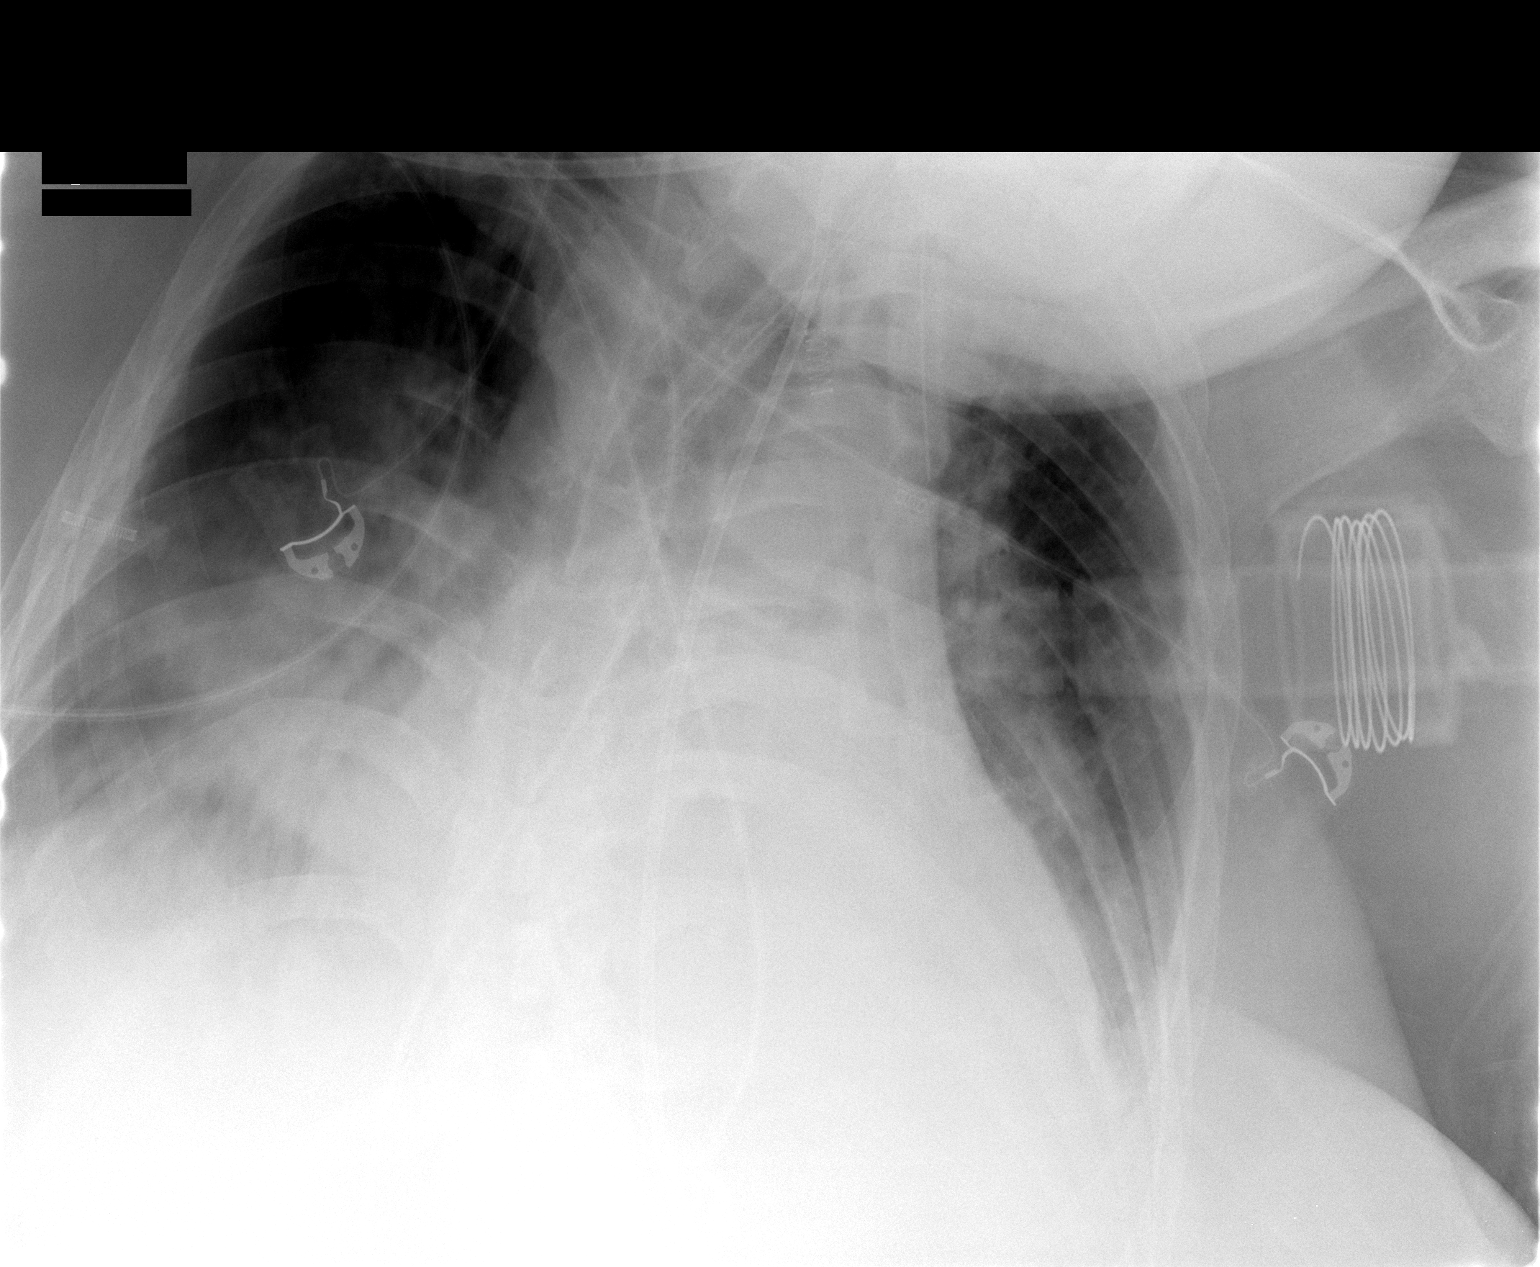

[1 of 1 positions shown; findings below may reference images not displayed]

FINDINGS: Today's exam is relatively under penetrated.  Little
overall change allowing for differences in technique.  Cardiomegaly
with bilateral airspace densities about the same.  The right lower
lobe is perhaps slightly better aerated.
IMPRESSION: Probably some improved aeration of the right lower lobe - otherwise
no significant change, allowing for differences in technique.

## 2012-03-23 IMAGING — CR DG CHEST 1V PORT
1 series · 1 of 1 positions shown · non-contrast
Comparison: 08/08/2009.

CLINICAL DATA: Status post PICC placement.

PORTABLE CHEST - 1 VIEW

[view not recorded]
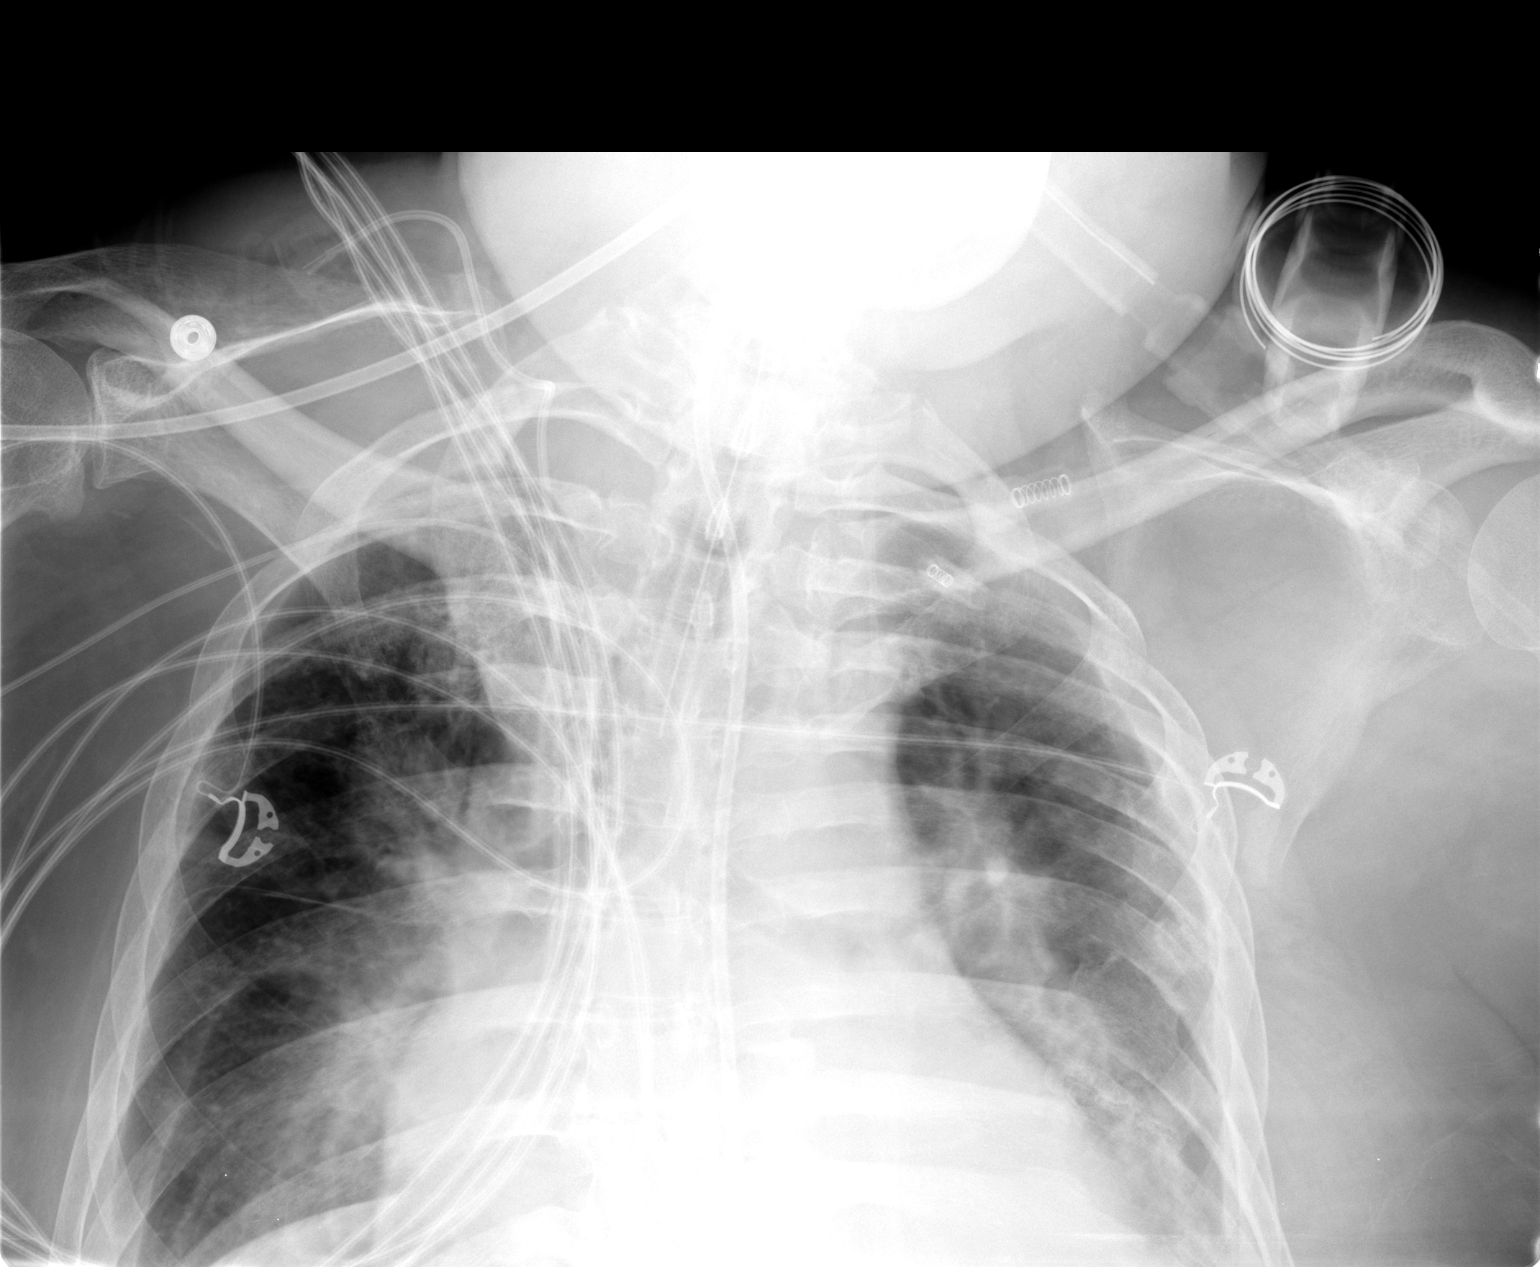

[1 of 1 positions shown; findings below may reference images not displayed]

FINDINGS: Interval right PICC with its tip obscured by overlying
wires.  Endotracheal tube in satisfactory position.  Stable
enlarged cardiac silhouette.  Decreased bibasilar airspace opacity
and bilateral pleural fluid.
IMPRESSION: 1.  The right PICC tip cannot be visualized due to overlying wires.
2.  Improving changes of congestive heart failure.
3.  Stable cardiomegaly.

## 2012-03-23 IMAGING — CR DG CHEST 1V PORT SAME DAY
1 series · 1 of 1 positions shown · non-contrast
Comparison: 08/09/2009 at [DATE] p.m.

CLINICAL DATA: PICC line placement.  Pneumonia.

PORTABLE CHEST - 1 VIEW SAME DAY

[view not recorded]
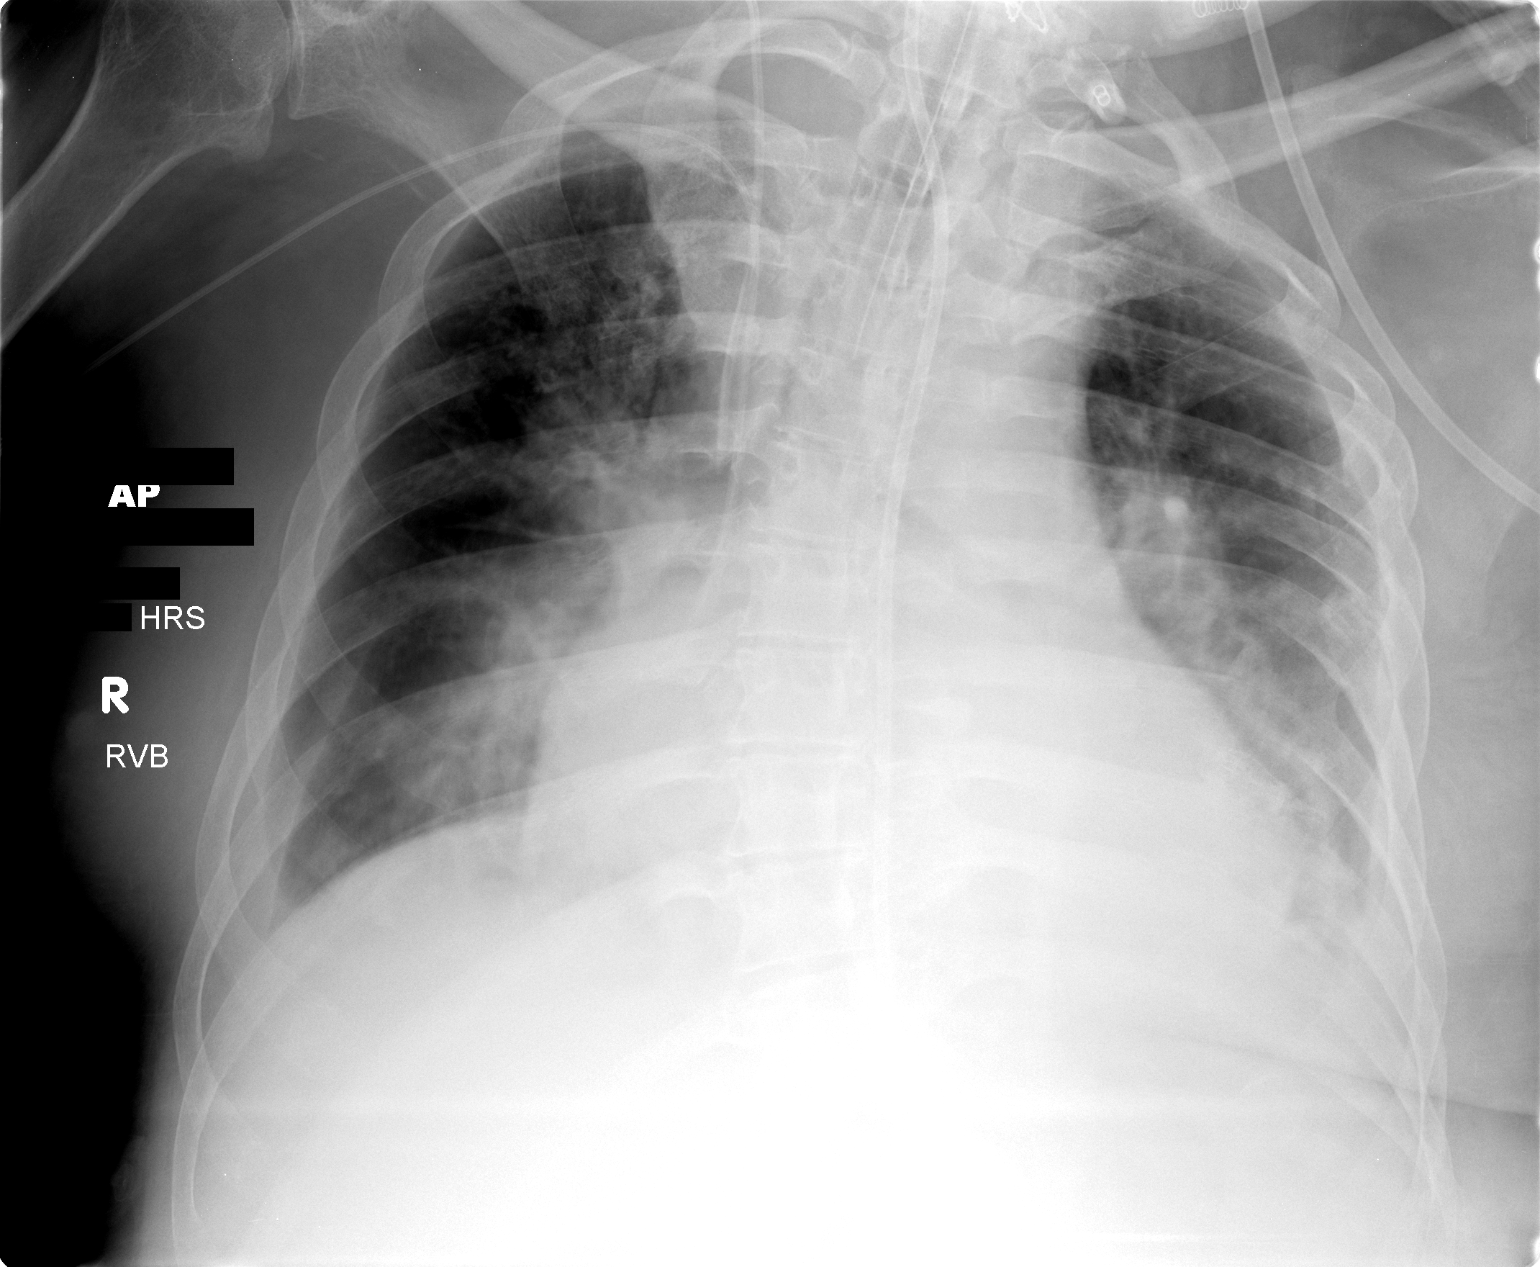

[1 of 1 positions shown; findings below may reference images not displayed]

FINDINGS: Endotracheal tube tip is 3 cm above the carina.  A
feeding tube is noted.

The right-sided PICC line is noted with tip in the lower SVC.  The
right IJ line tip is also in the SVC.

Prominent cardiopericardial silhouette noted with bilateral pleural
effusions and associated passive atelectasis.  Underlying pneumonia
is not excluded.
IMPRESSION: 1.  Right PICC line tip:  Lower SVC.  The right IJ line tip is also
in the SVC.
2.  Layering pleural effusions with associated atelectasis and
underlying airspace opacities.

## 2012-03-24 IMAGING — CR DG CHEST 1V PORT
1 series · 1 of 1 positions shown · non-contrast
Comparison: 08/09/2009

CLINICAL DATA: Pneumonia, atelectasis.

PORTABLE CHEST - 1 VIEW

[view not recorded]
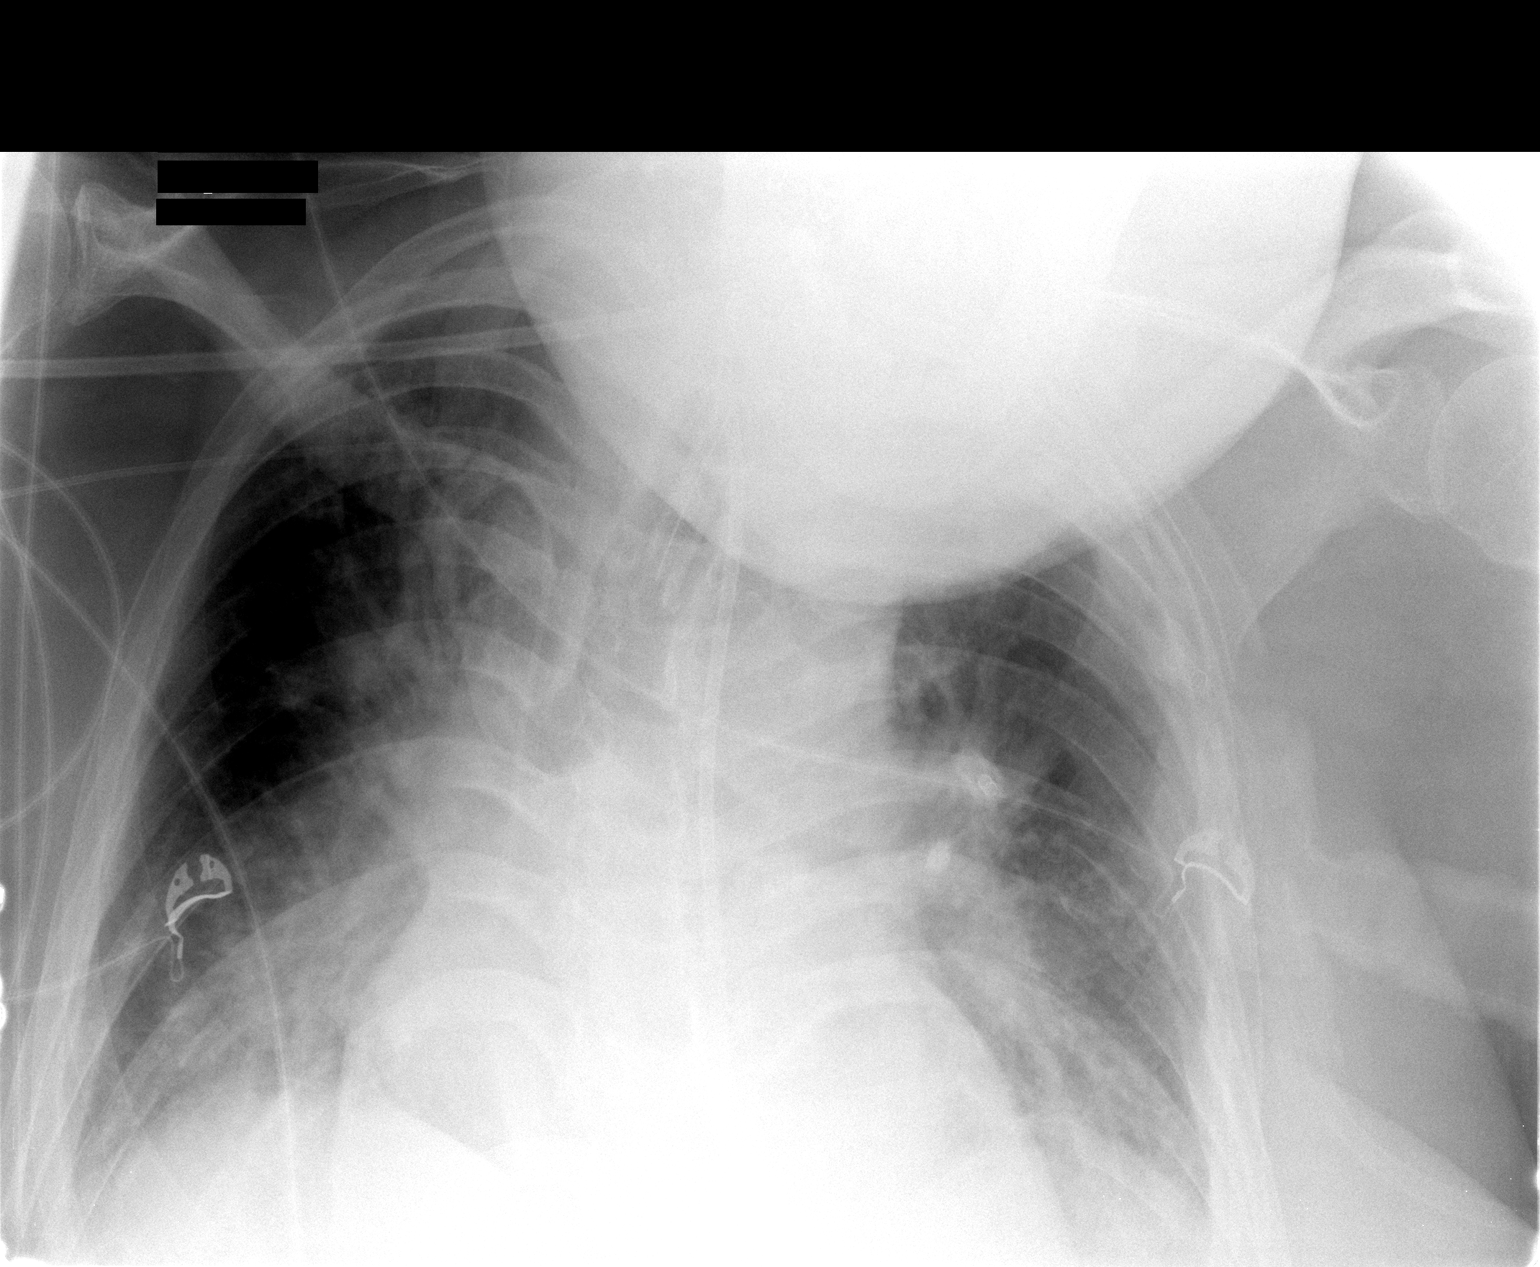

[1 of 1 positions shown; findings below may reference images not displayed]

FINDINGS: Right internal jugular central line has been removed.
Right PICC line and endotracheal tube are unchanged.  There is
cardiomegaly with vascular congestion, probable edema and small
effusions.  Bibasilar opacities are again noted, slightly
increasing on the right.
IMPRESSION: Continued mild edema and effusions.

Bibasilar opacities, increasing on the right.

## 2012-03-25 IMAGING — CR DG CHEST 1V PORT
1 series · 1 of 1 positions shown · non-contrast
Comparison: 08/10/2009

CLINICAL DATA: Edema, effusions, ventilator.

PORTABLE CHEST - 1 VIEW

[view not recorded]
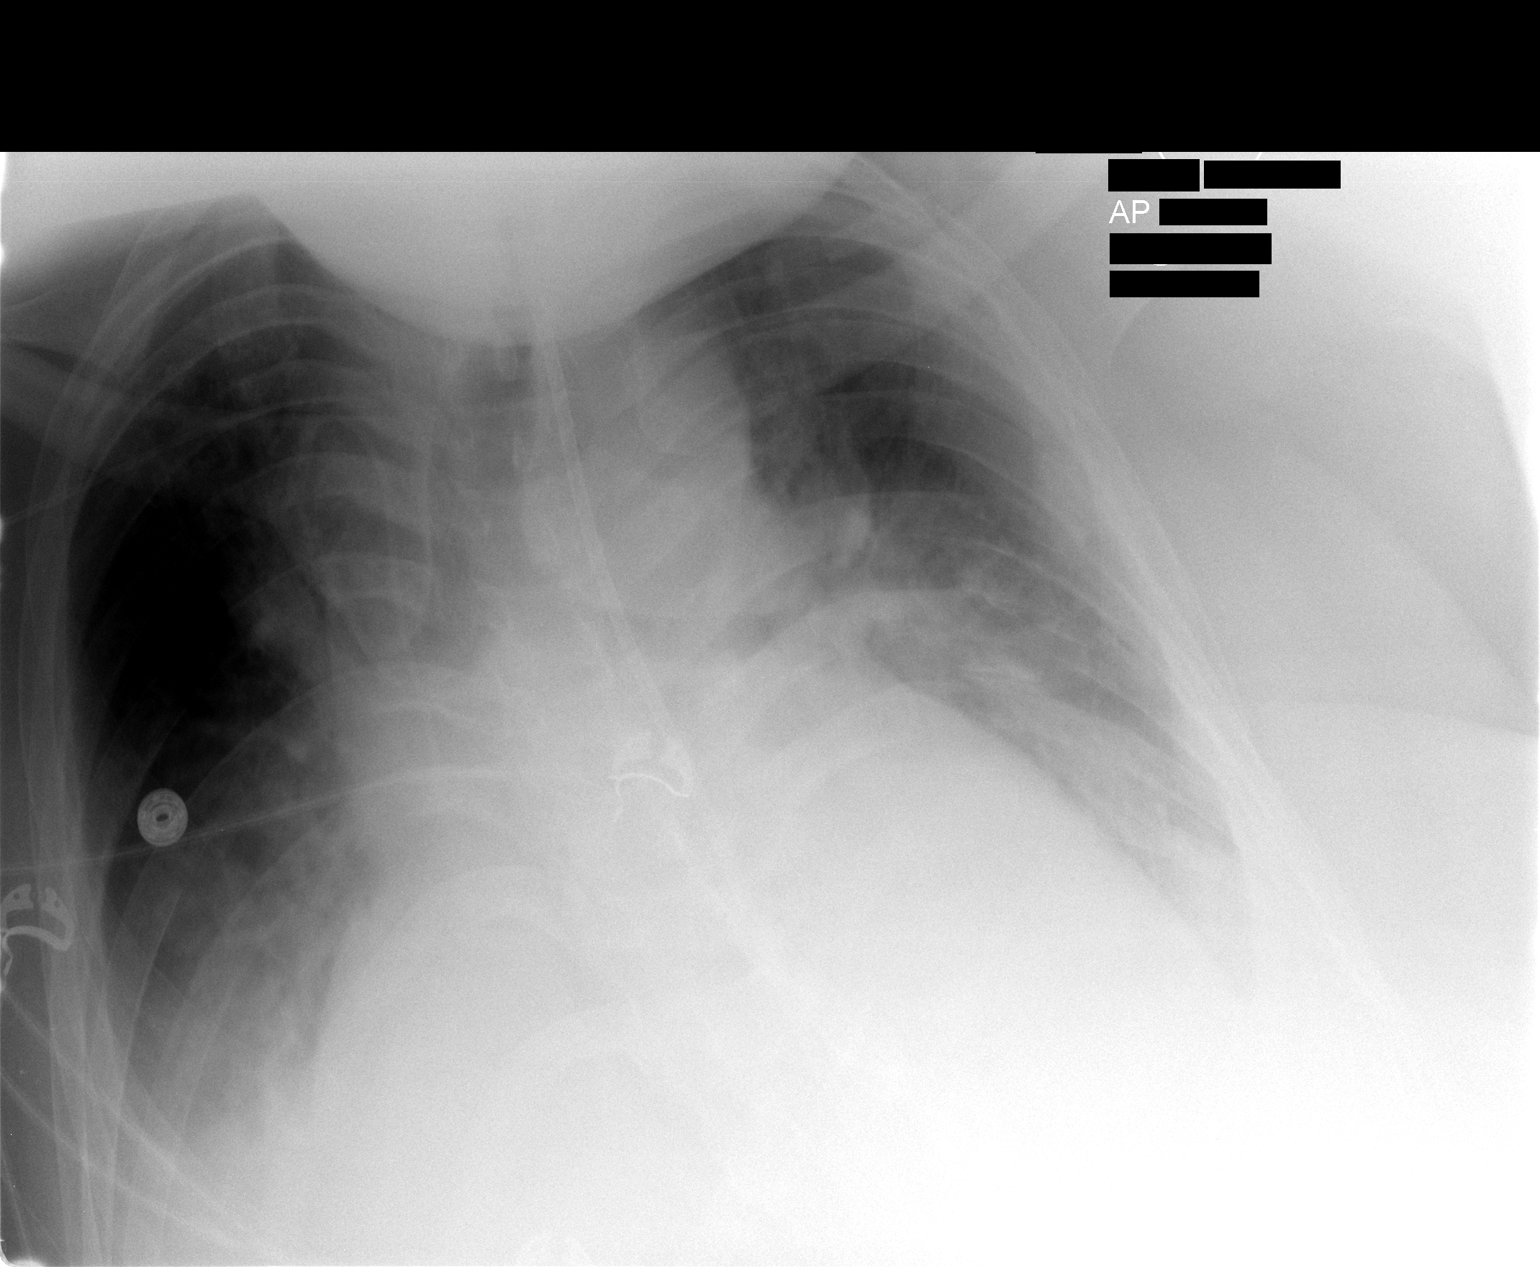

[1 of 1 positions shown; findings below may reference images not displayed]

FINDINGS: There is cardiomegaly with pulmonary edema and bilateral
effusions.  Suspect no interval change.  Interval removal of
endotracheal tube.  Right PICC line is in stable position.
IMPRESSION: No significant change.

## 2012-03-26 IMAGING — CR DG CHEST 1V PORT
1 series · 1 of 1 positions shown · non-contrast
Comparison: 08/11/2009

CLINICAL DATA: Pneumonia.

PORTABLE CHEST - 1 VIEW

[view not recorded]
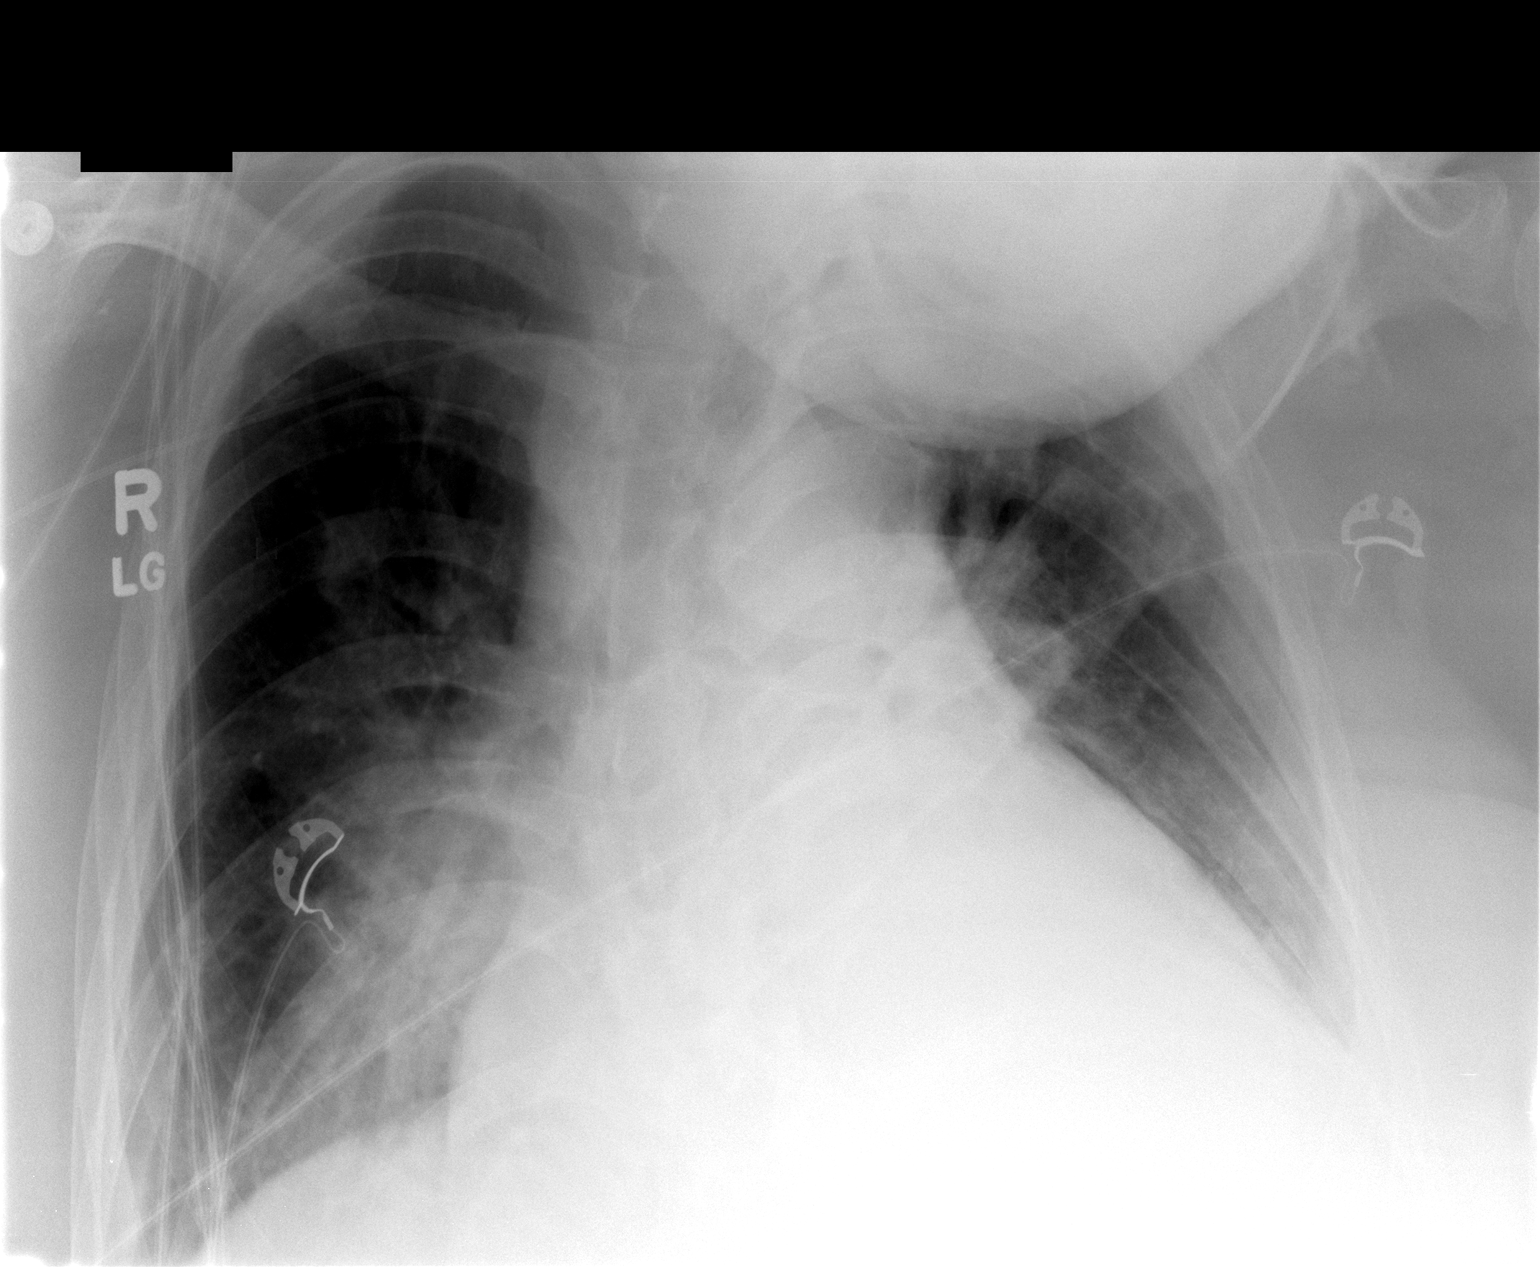

[1 of 1 positions shown; findings below may reference images not displayed]

FINDINGS: Heart is enlarged, stable.  There is perihilar and
bibasilar air space opacification.  Suspect left pleural effusion.
IMPRESSION: 1.  Perihilar and bibasilar air space disease may be due to edema
or pneumonia.
2.  Suspect left pleural effusion.

## 2012-04-02 ENCOUNTER — Encounter (HOSPITAL_COMMUNITY): Payer: Self-pay | Admitting: *Deleted

## 2012-04-02 ENCOUNTER — Emergency Department (HOSPITAL_COMMUNITY)
Admission: EM | Admit: 2012-04-02 | Discharge: 2012-04-02 | Disposition: A | Discharge status: Home / Self Care | Payer: Medicare Other | Attending: Emergency Medicine | Admitting: Emergency Medicine

## 2012-04-02 DIAGNOSIS — J9610 Chronic respiratory failure, unspecified whether with hypoxia or hypercapnia: Secondary | ICD-10-CM | POA: Insufficient documentation

## 2012-04-02 DIAGNOSIS — Z8669 Personal history of other diseases of the nervous system and sense organs: Secondary | ICD-10-CM | POA: Insufficient documentation

## 2012-04-02 DIAGNOSIS — Z8711 Personal history of peptic ulcer disease: Secondary | ICD-10-CM | POA: Insufficient documentation

## 2012-04-02 DIAGNOSIS — Z87440 Personal history of urinary (tract) infections: Secondary | ICD-10-CM | POA: Insufficient documentation

## 2012-04-02 DIAGNOSIS — J45909 Unspecified asthma, uncomplicated: Secondary | ICD-10-CM | POA: Insufficient documentation

## 2012-04-02 DIAGNOSIS — G8250 Quadriplegia, unspecified: Secondary | ICD-10-CM | POA: Insufficient documentation

## 2012-04-02 DIAGNOSIS — Z8709 Personal history of other diseases of the respiratory system: Secondary | ICD-10-CM | POA: Insufficient documentation

## 2012-04-02 DIAGNOSIS — Z872 Personal history of diseases of the skin and subcutaneous tissue: Secondary | ICD-10-CM | POA: Insufficient documentation

## 2012-04-02 DIAGNOSIS — Z79899 Other long term (current) drug therapy: Secondary | ICD-10-CM | POA: Insufficient documentation

## 2012-04-02 DIAGNOSIS — Z8719 Personal history of other diseases of the digestive system: Secondary | ICD-10-CM | POA: Insufficient documentation

## 2012-04-02 DIAGNOSIS — Z9359 Other cystostomy status: Secondary | ICD-10-CM

## 2012-04-02 DIAGNOSIS — I10 Essential (primary) hypertension: Secondary | ICD-10-CM | POA: Insufficient documentation

## 2012-04-02 DIAGNOSIS — G4733 Obstructive sleep apnea (adult) (pediatric): Secondary | ICD-10-CM | POA: Insufficient documentation

## 2012-04-02 DIAGNOSIS — Z466 Encounter for fitting and adjustment of urinary device: Secondary | ICD-10-CM

## 2012-04-02 LAB — URINALYSIS, ROUTINE W REFLEX MICROSCOPIC
Protein, ur: 100 mg/dL — AB
Urobilinogen, UA: 0.2 mg/dL (ref 0.0–1.0)

## 2012-04-02 LAB — URINE MICROSCOPIC-ADD ON

## 2012-04-02 MED ORDER — IBUPROFEN 800 MG PO TABS
800.0000 mg | ORAL_TABLET | Freq: Once | ORAL | Status: AC
  Administered 2012-04-02: 800 mg via ORAL
  Filled 2012-04-02: qty 1

## 2012-04-02 NOTE — ED Notes (Signed)
Pt from home, alert and oriented, quadriplegic , with c/o dislodged foley. Caretaker was trying to assist him in getting dressed and accidentally dislodged catheter. No bleeding noted.

## 2012-04-02 NOTE — ED Notes (Signed)
ZOX:[id number]<AV> Expected date:04/02/12<BR> Expected time: 8:22 PM<BR> Means of arrival:Ambulance<BR> Comments:<BR> Pulled out urinary catheter
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
## 2012-04-03 NOTE — ED Provider Notes (Signed)
Medical screening examination/treatment/procedure(s) were performed by non-physician practitioner and as supervising physician I was immediately available for consultation/collaboration.    Nelia Shi, MD 04/03/12 [phone]

## 2012-04-05 LAB — URINE CULTURE

## 2012-04-06 NOTE — ED Notes (Addendum)
+   urine Patient on home abx.-Doxycycline-sensitive to same.

## 2012-04-08 NOTE — ED Notes (Signed)
Chart returned from EDP office. Per [facility] PA-C, likely contaminant. Patient to follow-up with Urology.

## 2012-04-11 ENCOUNTER — Encounter: Payer: Self-pay | Admitting: Internal Medicine

## 2012-04-18 ENCOUNTER — Encounter (HOSPITAL_COMMUNITY): Payer: Self-pay | Admitting: *Deleted

## 2012-04-18 ENCOUNTER — Emergency Department (HOSPITAL_COMMUNITY)
Admission: EM | Admit: 2012-04-18 | Discharge: 2012-04-19 | Disposition: A | Discharge status: Home / Self Care | Payer: Medicare Other | Attending: Emergency Medicine | Admitting: Emergency Medicine

## 2012-04-18 DIAGNOSIS — I10 Essential (primary) hypertension: Secondary | ICD-10-CM | POA: Insufficient documentation

## 2012-04-18 DIAGNOSIS — R51 Headache: Secondary | ICD-10-CM

## 2012-04-18 DIAGNOSIS — Z862 Personal history of diseases of the blood and blood-forming organs and certain disorders involving the immune mechanism: Secondary | ICD-10-CM | POA: Insufficient documentation

## 2012-04-18 DIAGNOSIS — G4733 Obstructive sleep apnea (adult) (pediatric): Secondary | ICD-10-CM | POA: Insufficient documentation

## 2012-04-18 DIAGNOSIS — G40802 Other epilepsy, not intractable, without status epilepticus: Secondary | ICD-10-CM | POA: Insufficient documentation

## 2012-04-18 DIAGNOSIS — Z79899 Other long term (current) drug therapy: Secondary | ICD-10-CM | POA: Insufficient documentation

## 2012-04-18 DIAGNOSIS — G8250 Quadriplegia, unspecified: Secondary | ICD-10-CM | POA: Insufficient documentation

## 2012-04-18 DIAGNOSIS — Z8719 Personal history of other diseases of the digestive system: Secondary | ICD-10-CM | POA: Insufficient documentation

## 2012-04-18 DIAGNOSIS — J45909 Unspecified asthma, uncomplicated: Secondary | ICD-10-CM | POA: Insufficient documentation

## 2012-04-18 DIAGNOSIS — J9610 Chronic respiratory failure, unspecified whether with hypoxia or hypercapnia: Secondary | ICD-10-CM | POA: Insufficient documentation

## 2012-04-18 DIAGNOSIS — Z87440 Personal history of urinary (tract) infections: Secondary | ICD-10-CM | POA: Insufficient documentation

## 2012-04-18 LAB — POCT I-STAT, CHEM 8
Calcium, Ion: 1.24 mmol/L — ABNORMAL HIGH (ref 1.12–1.23)
Chloride: 106 mEq/L (ref 96–112)
Creatinine, Ser: 0.6 mg/dL (ref 0.50–1.35)
Glucose, Bld: 114 mg/dL — ABNORMAL HIGH (ref 70–99)
HCT: 27 % — ABNORMAL LOW (ref 39.0–52.0)

## 2012-04-18 LAB — URINALYSIS, ROUTINE W REFLEX MICROSCOPIC
Bilirubin Urine: NEGATIVE
Glucose, UA: NEGATIVE mg/dL
Hgb urine dipstick: NEGATIVE
Protein, ur: NEGATIVE mg/dL
Specific Gravity, Urine: 1.003 — ABNORMAL LOW (ref 1.005–1.030)
Urobilinogen, UA: 0.2 mg/dL (ref 0.0–1.0)

## 2012-04-18 LAB — URINE MICROSCOPIC-ADD ON

## 2012-04-18 MED ORDER — TRAMADOL HCL 50 MG PO TABS
50.0000 mg | ORAL_TABLET | Freq: Once | ORAL | Status: AC
  Administered 2012-04-18: 50 mg via ORAL
  Filled 2012-04-18: qty 1

## 2012-04-18 NOTE — ED Notes (Signed)
Pt sts feeling increasingly thirsty since Tuesday; Pt has suprapubic catether and sts today his caregiver told him his UOP was decreased. On arrival, over 1000 ml of light yellow, clear urine in the bag; Pt also sts he's having a headache today, he sts he took Tylenol around 3:00 pm but that only made headache worse. Pt is in NAD, VSS.
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
## 2012-04-18 NOTE — ED Notes (Signed)
Unsuccessful lab draw x2 by this writer. RN made aware. 

## 2012-04-18 NOTE — ED Notes (Signed)
Per EMS report: pt is coming from home with c/o urinary retention, per EMS pt stated he hasn't been able to void since 1:00 pm today. Per EMS pt also c/o "explosive headache"

## 2012-04-18 NOTE — ED Notes (Signed)
Bladder scan 25 ml

## 2012-04-20 LAB — URINE CULTURE

## 2012-04-21 NOTE — ED Notes (Signed)
+   Urine Chart sent to EDP office for review. 

## 2012-04-22 ENCOUNTER — Inpatient Hospital Stay (HOSPITAL_COMMUNITY)
Admission: EM | Admit: 2012-04-22 | Discharge: 2012-04-26 | DRG: 871 | Disposition: A | Discharge status: Home / Self Care | Payer: Medicare Other | Attending: Internal Medicine | Admitting: Internal Medicine

## 2012-04-22 ENCOUNTER — Inpatient Hospital Stay (HOSPITAL_COMMUNITY): Payer: Medicare Other

## 2012-04-22 ENCOUNTER — Encounter (HOSPITAL_COMMUNITY): Payer: Self-pay | Admitting: Emergency Medicine

## 2012-04-22 ENCOUNTER — Emergency Department (HOSPITAL_COMMUNITY): Payer: Medicare Other

## 2012-04-22 DIAGNOSIS — L8990 Pressure ulcer of unspecified site, unspecified stage: Secondary | ICD-10-CM | POA: Diagnosis present

## 2012-04-22 DIAGNOSIS — R110 Nausea: Secondary | ICD-10-CM

## 2012-04-22 DIAGNOSIS — D638 Anemia in other chronic diseases classified elsewhere: Secondary | ICD-10-CM | POA: Diagnosis present

## 2012-04-22 DIAGNOSIS — D72829 Elevated white blood cell count, unspecified: Secondary | ICD-10-CM | POA: Diagnosis present

## 2012-04-22 DIAGNOSIS — N390 Urinary tract infection, site not specified: Secondary | ICD-10-CM | POA: Diagnosis present

## 2012-04-22 DIAGNOSIS — Z79899 Other long term (current) drug therapy: Secondary | ICD-10-CM

## 2012-04-22 DIAGNOSIS — G4733 Obstructive sleep apnea (adult) (pediatric): Secondary | ICD-10-CM | POA: Diagnosis present

## 2012-04-22 DIAGNOSIS — L89109 Pressure ulcer of unspecified part of back, unspecified stage: Secondary | ICD-10-CM | POA: Diagnosis present

## 2012-04-22 DIAGNOSIS — E669 Obesity, unspecified: Secondary | ICD-10-CM | POA: Diagnosis present

## 2012-04-22 DIAGNOSIS — Z9359 Other cystostomy status: Secondary | ICD-10-CM

## 2012-04-22 DIAGNOSIS — I739 Peripheral vascular disease, unspecified: Secondary | ICD-10-CM | POA: Diagnosis present

## 2012-04-22 DIAGNOSIS — M8660 Other chronic osteomyelitis, unspecified site: Secondary | ICD-10-CM | POA: Diagnosis present

## 2012-04-22 DIAGNOSIS — L089 Local infection of the skin and subcutaneous tissue, unspecified: Secondary | ICD-10-CM | POA: Diagnosis present

## 2012-04-22 DIAGNOSIS — G40909 Epilepsy, unspecified, not intractable, without status epilepticus: Secondary | ICD-10-CM | POA: Diagnosis present

## 2012-04-22 DIAGNOSIS — I10 Essential (primary) hypertension: Secondary | ICD-10-CM | POA: Diagnosis present

## 2012-04-22 DIAGNOSIS — J189 Pneumonia, unspecified organism: Secondary | ICD-10-CM | POA: Diagnosis present

## 2012-04-22 DIAGNOSIS — G8220 Paraplegia, unspecified: Secondary | ICD-10-CM | POA: Diagnosis present

## 2012-04-22 DIAGNOSIS — D509 Iron deficiency anemia, unspecified: Secondary | ICD-10-CM | POA: Diagnosis present

## 2012-04-22 DIAGNOSIS — D649 Anemia, unspecified: Secondary | ICD-10-CM

## 2012-04-22 DIAGNOSIS — Z933 Colostomy status: Secondary | ICD-10-CM

## 2012-04-22 DIAGNOSIS — A419 Sepsis, unspecified organism: Principal | ICD-10-CM | POA: Diagnosis present

## 2012-04-22 DIAGNOSIS — G8250 Quadriplegia, unspecified: Secondary | ICD-10-CM | POA: Diagnosis present

## 2012-04-22 DIAGNOSIS — L89154 Pressure ulcer of sacral region, stage 4: Secondary | ICD-10-CM | POA: Diagnosis present

## 2012-04-22 DIAGNOSIS — E871 Hypo-osmolality and hyponatremia: Secondary | ICD-10-CM | POA: Diagnosis present

## 2012-04-22 LAB — CBC WITH DIFFERENTIAL/PLATELET
Basophils Absolute: 0 10*3/uL (ref 0.0–0.1)
Basophils Relative: 0 % (ref 0–1)
Eosinophils Absolute: 0.3 10*3/uL (ref 0.0–0.7)
Eosinophils Relative: 2 % (ref 0–5)
HCT: 26.2 % — ABNORMAL LOW (ref 39.0–52.0)
Hemoglobin: 8.1 g/dL — ABNORMAL LOW (ref 13.0–17.0)
Lymphocytes Relative: 16 % (ref 12–46)
Lymphs Abs: 2.7 10*3/uL (ref 0.7–4.0)
MCH: 22.1 pg — ABNORMAL LOW (ref 26.0–34.0)
MCHC: 30.9 g/dL (ref 30.0–36.0)
MCV: 71.4 fL — ABNORMAL LOW (ref 78.0–100.0)
Monocytes Absolute: 1.7 10*3/uL — ABNORMAL HIGH (ref 0.1–1.0)
Monocytes Relative: 10 % (ref 3–12)
Neutro Abs: 12.2 10*3/uL — ABNORMAL HIGH (ref 1.7–7.7)
Neutrophils Relative %: 72 % (ref 43–77)
Platelets: 569 10*3/uL — ABNORMAL HIGH (ref 150–400)
RBC: 3.67 MIL/uL — ABNORMAL LOW (ref 4.22–5.81)
RDW: 17.5 % — ABNORMAL HIGH (ref 11.5–15.5)
WBC: 16.9 10*3/uL — ABNORMAL HIGH (ref 4.0–10.5)

## 2012-04-22 LAB — COMPREHENSIVE METABOLIC PANEL
ALT: 10 U/L (ref 0–53)
AST: 12 U/L (ref 0–37)
Albumin: 2.8 g/dL — ABNORMAL LOW (ref 3.5–5.2)
Alkaline Phosphatase: 70 U/L (ref 39–117)
BUN: 10 mg/dL (ref 6–23)
CO2: 23 mEq/L (ref 19–32)
Calcium: 9.7 mg/dL (ref 8.4–10.5)
Chloride: 100 mEq/L (ref 96–112)
Creatinine, Ser: 0.44 mg/dL — ABNORMAL LOW (ref 0.50–1.35)
GFR calc Af Amer: >90 mL/min (ref >90)
GFR calc non Af Amer: >90 mL/min (ref >90)
Glucose, Bld: 129 mg/dL — ABNORMAL HIGH (ref 70–99)
Potassium: 3.5 mEq/L (ref 3.5–5.1)
Sodium: 136 mEq/L (ref 135–145)
Total Bilirubin: 0.2 mg/dL — ABNORMAL LOW (ref 0.3–1.2)
Total Protein: 8.8 g/dL — ABNORMAL HIGH (ref 6.0–8.3)

## 2012-04-22 LAB — GLUCOSE, CAPILLARY: Glucose-Capillary: 107 mg/dL — ABNORMAL HIGH (ref 70–99)

## 2012-04-22 LAB — URINALYSIS, ROUTINE W REFLEX MICROSCOPIC
Bilirubin Urine: NEGATIVE
Glucose, UA: NEGATIVE mg/dL
Ketones, ur: NEGATIVE mg/dL
Nitrite: POSITIVE — AB
Protein, ur: 30 mg/dL — AB
Specific Gravity, Urine: 1.018 (ref 1.005–1.030)
Urobilinogen, UA: 0.2 mg/dL (ref 0.0–1.0)
pH: 5.5 (ref 5.0–8.0)

## 2012-04-22 LAB — CG4 I-STAT (LACTIC ACID): Lactic Acid, Venous: 0.71 mmol/L (ref 0.5–2.2)

## 2012-04-22 LAB — URINE MICROSCOPIC-ADD ON

## 2012-04-22 LAB — LACTIC ACID, PLASMA: Lactic Acid, Venous: 0.7 mmol/L (ref 0.5–2.2)

## 2012-04-22 MED ORDER — CEFTRIAXONE SODIUM 1 G IJ SOLR
1.0000 g | INTRAMUSCULAR | Status: DC
  Administered 2012-04-22 – 2012-04-24 (×3): 1 g via INTRAVENOUS
  Filled 2012-04-22 (×4): qty 10

## 2012-04-22 MED ORDER — SODIUM CHLORIDE 0.9 % IV SOLN: INTRAVENOUS | Status: DC

## 2012-04-22 MED ORDER — ACETAMINOPHEN 325 MG PO TABS: 650.0000 mg | ORAL_TABLET | Freq: Four times a day (QID) | ORAL | Status: DC | PRN

## 2012-04-22 MED ORDER — SODIUM CHLORIDE 0.9 % IJ SOLN
3.0000 mL | Freq: Two times a day (BID) | INTRAMUSCULAR | Status: DC
  Administered 2012-04-25: 3 mL via INTRAVENOUS

## 2012-04-22 MED ORDER — SODIUM CHLORIDE 0.9 % IV SOLN
INTRAVENOUS | Status: DC
  Administered 2012-04-22 – 2012-04-25 (×4): via INTRAVENOUS

## 2012-04-22 MED ORDER — ONDANSETRON HCL 4 MG/2ML IJ SOLN: 4.0000 mg | Freq: Four times a day (QID) | INTRAMUSCULAR | Status: DC | PRN

## 2012-04-22 MED ORDER — ALUM & MAG HYDROXIDE-SIMETH 200-200-20 MG/5ML PO SUSP: 30.0000 mL | Freq: Four times a day (QID) | ORAL | Status: DC | PRN

## 2012-04-22 MED ORDER — ALBUTEROL SULFATE (5 MG/ML) 0.5% IN NEBU: 2.5000 mg | INHALATION_SOLUTION | Freq: Four times a day (QID) | RESPIRATORY_TRACT | Status: DC | PRN

## 2012-04-22 MED ORDER — ENOXAPARIN SODIUM 40 MG/0.4ML ~~LOC~~ SOLN
40.0000 mg | SUBCUTANEOUS | Status: DC
  Administered 2012-04-22 – 2012-04-23 (×2): 40 mg via SUBCUTANEOUS
  Filled 2012-04-22 (×3): qty 0.4

## 2012-04-22 MED ORDER — VITAMIN C 500 MG PO TABS
500.0000 mg | ORAL_TABLET | Freq: Two times a day (BID) | ORAL | Status: DC
  Administered 2012-04-22 – 2012-04-26 (×8): 500 mg via ORAL
  Filled 2012-04-22 (×9): qty 1

## 2012-04-22 MED ORDER — ACETAMINOPHEN 650 MG RE SUPP: 650.0000 mg | Freq: Four times a day (QID) | RECTAL | Status: DC | PRN

## 2012-04-22 MED ORDER — ZINC SULFATE 220 (50 ZN) MG PO CAPS
220.0000 mg | ORAL_CAPSULE | Freq: Every day | ORAL | Status: DC
  Administered 2012-04-22 – 2012-04-26 (×5): 220 mg via ORAL
  Filled 2012-04-22 (×5): qty 1

## 2012-04-22 MED ORDER — ADULT MULTIVITAMIN W/MINERALS CH
1.0000 | ORAL_TABLET | Freq: Every day | ORAL | Status: DC
  Administered 2012-04-22 – 2012-04-26 (×5): 1 via ORAL
  Filled 2012-04-22 (×5): qty 1

## 2012-04-22 MED ORDER — DOCUSATE SODIUM 100 MG PO CAPS
100.0000 mg | ORAL_CAPSULE | Freq: Two times a day (BID) | ORAL | Status: DC
  Administered 2012-04-22 – 2012-04-26 (×8): 100 mg via ORAL
  Filled 2012-04-22 (×9): qty 1

## 2012-04-22 MED ORDER — SODIUM CHLORIDE 0.9 % IJ SOLN
10.0000 mL | INTRAMUSCULAR | Status: DC | PRN
  Administered 2012-04-23 – 2012-04-25 (×4): 10 mL
  Administered 2012-04-26: 20 mL
  Administered 2012-04-26: 10 mL

## 2012-04-22 MED ORDER — BACLOFEN 20 MG PO TABS
20.0000 mg | ORAL_TABLET | Freq: Four times a day (QID) | ORAL | Status: DC
  Administered 2012-04-22 – 2012-04-26 (×16): 20 mg via ORAL
  Filled 2012-04-22 (×20): qty 1

## 2012-04-22 MED ORDER — SODIUM CHLORIDE 0.9 % IV BOLUS (SEPSIS)
1000.0000 mL | Freq: Once | INTRAVENOUS | Status: AC
  Administered 2012-04-22: 1000 mL via INTRAVENOUS

## 2012-04-22 MED ORDER — METOCLOPRAMIDE HCL 10 MG PO TABS
10.0000 mg | ORAL_TABLET | Freq: Two times a day (BID) | ORAL | Status: DC
  Administered 2012-04-22 – 2012-04-26 (×8): 10 mg via ORAL
  Filled 2012-04-22 (×10): qty 1

## 2012-04-22 MED ORDER — ZINC GLUCONATE 50 MG PO TABS: 50.0000 mg | ORAL_TABLET | Freq: Two times a day (BID) | ORAL | Status: DC

## 2012-04-22 MED ORDER — FERROUS SULFATE 325 (65 FE) MG PO TABS
325.0000 mg | ORAL_TABLET | Freq: Three times a day (TID) | ORAL | Status: DC
  Administered 2012-04-22 – 2012-04-26 (×11): 325 mg via ORAL
  Filled 2012-04-22 (×14): qty 1

## 2012-04-22 MED ORDER — ACETAMINOPHEN 325 MG PO TABS
650.0000 mg | ORAL_TABLET | Freq: Once | ORAL | Status: AC
  Administered 2012-04-22: 650 mg via ORAL
  Filled 2012-04-22: qty 2

## 2012-04-22 MED ORDER — ONDANSETRON HCL 4 MG PO TABS: 4.0000 mg | ORAL_TABLET | Freq: Four times a day (QID) | ORAL | Status: DC | PRN

## 2012-04-22 MED ORDER — DOXYCYCLINE HYCLATE 100 MG PO TABS
100.0000 mg | ORAL_TABLET | Freq: Two times a day (BID) | ORAL | Status: DC
  Administered 2012-04-22 – 2012-04-26 (×9): 100 mg via ORAL
  Filled 2012-04-22 (×10): qty 1

## 2012-04-22 MED ORDER — FAMOTIDINE 20 MG PO TABS
20.0000 mg | ORAL_TABLET | Freq: Two times a day (BID) | ORAL | Status: DC
  Administered 2012-04-22 – 2012-04-26 (×8): 20 mg via ORAL
  Filled 2012-04-22 (×9): qty 1

## 2012-04-22 NOTE — Progress Notes (Signed)
NURSING PROGRESS NOTE  [name] [name] [id number] Admission Data: 04/22/2012 5:35 PM Attending Provider: Lorane Gell, MD [id number],[id number] N, MD Code Status: full   [name] [name] is a 46 y.o. male patient admitted from ED  No acute distress noted.  No c/o shortness of breath, no c/o chest pain.  Cardiac tele # [phone], in place, cardiac monitor yields:normal sinus rhythm.  Blood pressure 90/62, pulse 85, temperature 98.3 F (36.8 C), temperature source Oral, resp. rate 18, height 6' (1.829 m), weight 113 kg (249 lb 1.9 oz), SpO2 99.00%.   IV Fluids:  IV in place, occlusive dsg intact without redness, PICC upper arm right, condition patent and no redness normal saline.   Allergies:  Review of patient's allergies indicates no known allergies.  Past Medical History:   has a past medical history of History of UTI; Decubitus ulcer, stage IV; Seizure disorder; OSA (obstructive sleep apnea); HTN (hypertension); Quadriplegia; Normocytic anemia; Acute respiratory failure; History of sepsis; History of gastritis; History of gastric ulcer; History of esophagitis; History of small bowel obstruction (June 2009); Osteomyelitis of vertebra of sacral and sacrococcygeal region; Morbid obesity; Coagulase-negative staphylococcal infection; Chronic respiratory failure; Quadriplegia; and Asthma.  Past Surgical History:   has past surgical history that includes Cervical fusion; Prior surgeries for bed sores; Prior diverting colostomy; and Suprapubic catheter placement.  Social History:   reports that he has never smoked. He has never used smokeless tobacco. He reports that he drinks alcohol. He reports that he does not use illicit drugs.  Skin: Pressure ulcers to both outer hips and left knee. Dressing in place, clean, dry and intact. Wounds present on admission.  Orientation to room, and floor completed with information packet given to patient/family. Admission INP armband ID verified with patient/family, and in  place.   SR up x 2, fall assessment complete, with patient and family able to verbalize understanding of risk associated with falls, and verbalized understanding to call for assistance before getting out of bed.   Call light within reach. Patient able to voice and demonstrate understanding of unit orientation instructions.   Will cont to eval and treat per MD orders.  Rosaleigh Brazzel, Elmarie Mainland, RN

## 2012-04-22 NOTE — ED Notes (Signed)
Patient admitted. Being treated with Rocephin.

## 2012-04-22 NOTE — Progress Notes (Signed)
[employer] representative called and said they could not deliver the air mattress overlay unitl the morning.
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
## 2012-04-22 NOTE — ED Notes (Signed)
Spoke with staff on 5500 no mattress for bed as of yet.

## 2012-04-22 NOTE — ED Notes (Signed)
Spoke with IV team to verify that they are aware of need for PICC line.  Maralyn Sago states that they are aware.
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
## 2012-04-22 NOTE — ED Notes (Signed)
Pt's CBG was 107 when I checked it.12:13 am JG.

## 2012-04-22 NOTE — Procedures (Signed)
Placement of right arm PICC.  Tip at SVC/RA junction and ready to use.

## 2012-04-22 NOTE — ED Provider Notes (Signed)
Medical screening examination/treatment/procedure(s) were performed by non-physician practitioner and as supervising physician I was immediately available for consultation/collaboration.   Lorenz Donley M Nathan Stallworth, DO 04/22/12 1334 

## 2012-04-22 NOTE — ED Notes (Signed)
C/O nausea and feeling light headed

## 2012-04-22 NOTE — ED Notes (Signed)
No distress noted.  Resp symmetrical and unlabored.  Skin warm and dry.  Talking on phone.

## 2012-04-23 DIAGNOSIS — L8990 Pressure ulcer of unspecified site, unspecified stage: Secondary | ICD-10-CM

## 2012-04-23 LAB — RETICULOCYTES: Retic Count, Absolute: 42.3 10*3/uL (ref 19.0–186.0)

## 2012-04-23 LAB — CBC
HCT: 22.2 % — ABNORMAL LOW (ref 39.0–52.0)
MCHC: 31.1 g/dL (ref 30.0–36.0)
MCV: 72.3 fL — ABNORMAL LOW (ref 78.0–100.0)
RDW: 17.5 % — ABNORMAL HIGH (ref 11.5–15.5)
WBC: 10.6 10*3/uL — ABNORMAL HIGH (ref 4.0–10.5)

## 2012-04-23 LAB — BASIC METABOLIC PANEL
BUN: 10 mg/dL (ref 6–23)
CO2: 23 mEq/L (ref 19–32)
Chloride: 105 mEq/L (ref 96–112)
Creatinine, Ser: 0.43 mg/dL — ABNORMAL LOW (ref 0.50–1.35)
Sodium: 138 mEq/L (ref 135–145)

## 2012-04-23 LAB — IRON AND TIBC
Iron: <10 ug/dL — ABNORMAL LOW (ref 42–135)
UIBC: 138 ug/dL (ref 125–400)

## 2012-04-23 LAB — FOLATE: Folate: >20 ng/mL

## 2012-04-23 MED ORDER — FUROSEMIDE 20 MG PO TABS
20.0000 mg | ORAL_TABLET | Freq: Every day | ORAL | Status: DC
  Administered 2012-04-23 – 2012-04-26 (×4): 20 mg via ORAL
  Filled 2012-04-23 (×5): qty 1

## 2012-04-23 MED ORDER — POTASSIUM CHLORIDE CRYS ER 20 MEQ PO TBCR
20.0000 meq | EXTENDED_RELEASE_TABLET | Freq: Every day | ORAL | Status: DC
  Administered 2012-04-23 – 2012-04-26 (×4): 20 meq via ORAL
  Filled 2012-04-23 (×5): qty 1

## 2012-04-23 NOTE — Progress Notes (Signed)
Placed pt on cpap as per order. Pt. Is tolerating well at this time. RT to monitor.

## 2012-04-23 NOTE — Progress Notes (Signed)
CRITICAL VALUE ALERT  Critical value received:  Hemoglobin 6.9  Date of notification:  04/23/12  Time of notification:  0610  Critical value read back:yes  Nurse who received alert:  Macarthur Critchley  MD notified (1st page):  Burnadette Peter, hospitalist  Time of first page:  0612  MD notified (2nd page):  Time of second page:  Responding MD:  lynch  Time MD responded:  [phone]

## 2012-04-23 NOTE — Progress Notes (Signed)
PT HYDROTHERAPY EVALUATION NOTE    04/23/12 1400  Subjective Assessment  Subjective Pt pleasent and agreeable to hydrotherapy; pt states he has had it in the past  Patient and Family Stated Goals heal wounds  Date of Onset (prior to admission)  Prior Treatments Previous wound care; hydro therapy; dressing changes with home health  Evaluation and Treatment  Evaluation and Treatment Procedures Explained to Patient/Family Yes  Evaluation and Treatment Procedures agreed to  Pressure Ulcer 04/22/12 Stage IV - Full thickness tissue loss with exposed bone, tendon or muscle.  Date First Assessed/Time First Assessed: 04/22/12 1501   Location: Ischial tuberosity  Location Orientation: Right  Staging: Stage IV - Full thickness tissue loss with exposed bone, tendon or muscle.  Present on Admission: Yes  State of Healing Early/partial granulation  Site / Wound Assessment Dusky;Granulation tissue;Pink;Red;Yellow  % Wound base Red or Granulating 75%  % Wound base Yellow 20%  % Wound base Other (Comment) 5% (exposed muscle/tendon)  Peri-wound Assessment Maceration;Erythema (blanchable);Edema  Wound Length (cm) 15 cm  Wound Width (cm) 12 cm  Wound Depth (cm) 1.5 cm  Tunneling (cm) 6cm at 2:00  Undermining (cm) .7 cm  Margins Unattacted edges (unapproximated)  Drainage Amount Moderate  Drainage Description Serosanguineous;Purulent;Odor  Treatment Cleansed;Debridement (Selective);Hydrotherapy (Pulse lavage);Packing (Saline gauze);Tape changed (skin prep; ABDs)  Dressing Type ABD;Barrier Film (skin prep);Gauze (Comment) (Kerlex)  Dressing Clean;Intact  Selective Debridement  Selective Debridement - Location Right Ischial tuberosity wound margins  Selective Debridement - Tools Used Scissors;Forceps  Selective Debridement - Tissue Removed slough and maceratic necrotic tissue  Wound Therapy - Assess/Plan/Recommendations  Wound Therapy - Clinical Statement Pt tolerated hydrotherapy well; wound bed did  present with moderate bleeding immediately following PLS but was controlled with gauze padding prior to dressing aplplication. Pt presents with multiple pressue wounds.  Addressing Right ischial tuberosity with PLS and selective debridement.  Wound bed presents with necrotic margins and tunneling.  Rec continued hydrotherapy to cleanse wound and facilitate debridement and healing. Will use selective debridement to further eliminate unviable tissue from wound margins.   Wound Therapy - Functional Problem List decreased skin integrity secondary to immobility  Factors Delaying/Impairing Wound Healing Altered sensation;Diabetes Mellitus;Infection - systemic/local;Immobility;Multiple medical problems;Vascular compromise  Hydrotherapy Plan Debridement;Dressing change;Patient/family education;Pulsatile lavage with suction  Wound Therapy - Frequency 6X / week  Wound Therapy - Follow Up Recommendations Home health RN  Wound Plan Continue hydrotherapy as indicated  Wound Therapy Goals - Improve the function of patient's integumentary system by progressing the wound(s) through the phases of wound healing by:  Decrease Necrotic Tissue to 10%  Decrease Necrotic Tissue - Progress Goal set today  Increase Granulation Tissue to 90%  Increase Granulation Tissue - Progress Goal set today  Goals/treatment plan/discharge plan were made with and agreed upon by patient/family Yes  Time For Goal Achievement 7 days  Wound Therapy - Potential for Goals Fair     Charlotte Crumb, PT DPT  [phone]

## 2012-04-23 NOTE — Consult Note (Addendum)
WOC consult Note Reason for Consult: eval pressure ulcers. Pt known to this WOC from previous admissions to inpatient and from the home health care.   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 Healed Stage IV pressure ulcer- sacrum, has some peeling of this skin but essentially healed. Right knee Unstageable pressure ulcer   Scarring throughout the whole buttock/sacrum and ischial region for chronic pressure ulcers.   Pressure Ulcer POA: Yes x 5  Measurement and wound bed:  Left trochanter: 4cm x 2xm x 0.5cm, pink and moist with epibole of the wound edges, pale no granulation Left ischial: 4cm x 4cm x 0.5cm, pink and moist, no granulation tissue Right buttock/right ischial: 12cm x 15cm x 0.5cm with tunnel at 1 o'clock that is 2cm. Left knee: 4cm x 4cm x 0.2cm   Drainage (amount, consistency, odor)  Serousanginous from the left wounds, no odor Serousanginous from the right, however has some purulent thick drainage from area at especially at 3 o'clock, and from the tunneled area (1 o'clock) Right knee: moderate, serosanguinous, no odor. Periwound: intact to most areas, some maceration of the sacrum/ and buttocks  Dressing procedure/placement/frequency: Left trochanter and left ischial: cut to fit calcium alginate to wounds, cover with dry gauze and ABD, secure with tape. Change daily. Right trochanter/ischial buttock: PT to perform hydrotherapy daily M-Sat. Then normal saline dressing, cover with ABD's and tape. Right knee, silver hydrofiber cut to fit, cover with dry dressing and secure with tape.   Air mattress arrived in pts room at the time I finished assessment, to be placed today.   Paged ortho tech to notified of the new need for hydrotherapy. Discussed POC with bedside nursing and with pt.  Notified pt that the right  side wounds are much worse than the last time I seen him in the ER.  Would encourage him to turn more and stay out of wheelchair as much as possible.   WOC will follow along with you and PT for wound assessments Mahli Glahn Eliberto Ivory RN,CWOCN [phone]
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
## 2012-04-24 DIAGNOSIS — G4733 Obstructive sleep apnea (adult) (pediatric): Secondary | ICD-10-CM

## 2012-04-24 LAB — BASIC METABOLIC PANEL
BUN: 8 mg/dL (ref 6–23)
CO2: 26 mEq/L (ref 19–32)
Chloride: 105 mEq/L (ref 96–112)
GFR calc non Af Amer: >90 mL/min (ref >90)
Glucose, Bld: 113 mg/dL — ABNORMAL HIGH (ref 70–99)
Potassium: 3.6 mEq/L (ref 3.5–5.1)

## 2012-04-24 LAB — TYPE AND SCREEN
ABO/RH(D): B POS
Antibody Screen: NEGATIVE

## 2012-04-24 LAB — CBC
HCT: 25.3 % — ABNORMAL LOW (ref 39.0–52.0)
Hemoglobin: 7.7 g/dL — ABNORMAL LOW (ref 13.0–17.0)
MCHC: 30.4 g/dL (ref 30.0–36.0)

## 2012-04-24 MED ORDER — ENOXAPARIN SODIUM 40 MG/0.4ML ~~LOC~~ SOLN
40.0000 mg | SUBCUTANEOUS | Status: DC
  Administered 2012-04-24 – 2012-04-26 (×3): 40 mg via SUBCUTANEOUS
  Filled 2012-04-24 (×3): qty 0.4

## 2012-04-24 MED ORDER — PRO-STAT SUGAR FREE PO LIQD
30.0000 mL | Freq: Two times a day (BID) | ORAL | Status: DC
  Administered 2012-04-26: 30 mL via ORAL
  Filled 2012-04-24 (×6): qty 30
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
## 2012-04-24 NOTE — Plan of Care (Signed)
Problem: Phase II Progression Outcomes Goal: Voiding independently Outcome: Not Applicable Date Met:  04/24/12 Has suprapubic catheter

## 2012-04-24 NOTE — Care Management Note (Addendum)
    Page 1 of 2   04/26/2012     2:42:12 PM   CARE MANAGEMENT NOTE 04/26/2012  Patient:  [name] [name],[name] [name]   Account Number:  [id number]  Date Initiated:  04/23/2012  Documentation initiated by:  Letha Cape  Subjective/Objective Assessment:   dx sepsis  admit- lives with room mate.  Patient has care giver bid /day and will be moving in with caregiver 12/16 and she will be there 24/7.     Action/Plan:   hhrn for iv abx   Anticipated DC Date:  04/26/2012   Anticipated DC Plan:  HOME W HOME HEALTH SERVICES      DC Planning Services  CM consult      [place] Choice  HOME HEALTH   Choice offered to / List presented to:  C-1 Patient        HH arranged  HH-1 RN      [facility] agency  [facility]   Status of service:  Completed, signed off Medicare Important Message given?   (If response is "NO", the following Medicare IM given date fields will be blank) Date Medicare IM given:   Date Additional Medicare IM given:    Discharge Disposition:  HOME W HOME HEALTH SERVICES  Per UR Regulation:  Reviewed for med. necessity/level of care/duration of stay  If discussed at Long Length of Stay Meetings, dates discussed:    Comments:  04/26/12 14:40 Letha Cape RN, BSN [phone] patient for dc today, he has apt schedule at the wound care center at [facility] and also will have [facility] for IV ABX with [facility]. [facility] notified that pt will be dc today, patient will need ambulance, notified CSW.  04/25/12 13:50 Letha Cape RN, BSN [phone] spoke with patient he has a caregiver twice a/day , but states he and his caregiver will be moving in together on Monday 04/30/12 in which the care giver will be there 24/7. Patient will go home with cefepime iv abxs q12 and will need hhrn, patient chose [facility] but they would not be able to staff with [facility] for IV abx for tomorrow so he chose [place] from agency  list.  Referral made to Elizebeth Koller notified.  Soc will begin 24-48 hrs post discharge. Recieved call  from [facility] with Genevieve Norlander stating they can not take any more commercials so can not take referral, advised patient to choose someone else on the list, he chose [facility], referral made to [facility], Lupita Leash notified.  04/23/12 Letha Cape RN, BSN [phone] patient lives with roommate, patient is bed bound,  has chronic supra pubic catheter, patient has some ast at home will check to see how much help.  NCM will continue to follow for dc needs.  [place] with [facility] spoke with patient and he agrees to particiapate in their services.

## 2012-04-24 NOTE — Progress Notes (Signed)
PT HYDROTHERAPY PROGRESS NOTE   04/24/12 1300  Subjective Assessment  Subjective Pt agreeable to hydrotherapy  Patient and Family Stated Goals heal wounds  Date of Onset (prior to admission)  Prior Treatments Previous wound care; hydro therapy; dressing changes with home health  Evaluation and Treatment  Evaluation and Treatment Procedures Explained to Patient/Family Yes  Evaluation and Treatment Procedures agreed to  Pressure Ulcer 04/22/12 Stage IV - Full thickness tissue loss with exposed bone, tendon or muscle.  Date First Assessed/Time First Assessed: 04/22/12 1501   Location: Ischial tuberosity  Location Orientation: Right  Staging: Stage IV - Full thickness tissue loss with exposed bone, tendon or muscle.  Present on Admission: Yes  State of Healing Early/partial granulation  Site / Wound Assessment Dusky;Granulation tissue;Pink;Red;Yellow  % Wound base Red or Granulating 75%  % Wound base Yellow 20%  % Wound base Other (Comment) 5% (exposed muscle/tendon)  Peri-wound Assessment Maceration;Erythema (blanchable);Edema  Margins Unattacted edges (unapproximated)  Drainage Amount Moderate  Drainage Description Serosanguineous;Purulent;Odor  Treatment Cleansed;Debridement (Selective);Hydrotherapy (Pulse lavage);Packing (Saline gauze);Tape changed  Dressing Type ABD;Barrier Film (skin prep);Gauze (Comment) (Kerlex)  Dressing Clean;Intact  Pressure Ulcer 04/22/12 Stage IV - Full thickness tissue loss with exposed bone, tendon or muscle.  Date First Assessed/Time First Assessed: 04/22/12 1501   Location: Ischial tuberosity  Location Orientation: Left  Staging: Stage IV - Full thickness tissue loss with exposed bone, tendon or muscle.  Present on Admission: Yes  Dressing Changed;Clean;Intact  Hydrotherapy  Pulsed Lavage with Suction (psi) 4 psi  Pulsed Lavage with Suction - Normal Saline Used 1000 mL  Pulsed Lavage Tip Tip with splash shield  Pulsed lavage therapy - wound location  Right Ischial Wound  Selective Debridement  Selective Debridement - Location Right Ischial tuberosity wound margins  Selective Debridement - Tools Used Scissors;Forceps  Selective Debridement - Tissue Removed slough and maceratic necrotic tissue  Wound Therapy - Assess/Plan/Recommendations  Wound Therapy - Clinical Statement Pt tolerated hydrotherapy well; wound bed did present with moderate bleeding immediately following PLS but was controlled with gauze padding prior to dressing aplplication. Pt presents with multiple pressue wounds.  Addressing Right ischial tuberosity with PLS and selective debridement.  Wound bed presents with necrotic margins and tunneling.  Rec continued hydrotherapy to cleanse wound and facilitate debridement and healing. Will use selective debridement to further eliminate unviable tissue from wound margins.   Wound Therapy - Functional Problem List decreased skin integrity secondary to immobility  Factors Delaying/Impairing Wound Healing Altered sensation;Diabetes Mellitus;Infection - systemic/local;Immobility;Multiple medical problems;Vascular compromise  Hydrotherapy Plan Debridement;Dressing change;Patient/family education;Pulsatile lavage with suction  Wound Therapy - Frequency 6X / week  Wound Therapy - Follow Up Recommendations Home health RN  Wound Plan Continue hydrotherapy as indicated  Wound Therapy Goals - Improve the function of patient's integumentary system by progressing the wound(s) through the phases of wound healing by:  Decrease Necrotic Tissue to 10%  Decrease Necrotic Tissue - Progress Goal set today  Increase Granulation Tissue to 90%  Increase Granulation Tissue - Progress Goal set today  Goals/treatment plan/discharge plan were made with and agreed upon by patient/family Yes  Time For Goal Achievement 7 days  Wound Therapy - Potential for Goals Fair    Charlotte Crumb, PT DPT  [phone]

## 2012-04-24 NOTE — Progress Notes (Signed)
PATIENT DETAILS Name: [name] [name] Age: 46 y.o. Sex: male Date of Birth: 1965/09/18 Admit Date: 04/22/2012 Admitting Physician Sorin Luanne Bras, MD WUJ:[id number],WGNFA N, MD  Subjective: Feels better but did have low-grade fever overnight  Assessment/Plan: Principal Problem:  *Sepsis -from UTI -treat with IVF and antibiotics-better compared to 12/8 -await blood and urine cultures drawn 12/8 - Continue with empiric Rocephin  Active Problems: UTI -recent UTI with Citrobacter -await Urine cultures drawn on admission- culture now re incubated  - Low grade fever overnight, however does not look toxic -c/w Rocephin -has underlying Supra-pubic catheter  Anemia -significant drop in Hb-there is no evidence of overt blood loss -?2/2 to IV Hydration, with likely anemia of chronic disease and iron deficiency at  baseline - Status post 1 unit of PRBC transfusion on 12/9, hemoglobin up to 7.7 today -on Fe supplementation - Monitor CBC periodically  Chronic Osteo sacrum/coccyx -on Doxycycline-continue  Sacral Decubitus Ulcers -chronic issue -appreciate wound care input - Have asked case management to make an appointment for the patient had deep wound care center-patient has been seen there previously in November  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    Gen Exam: Awake and alert with clear speech.   Neck: Supple, No JVD.   Chest: B/L Clear.   CVS: S1 S2 Regular, no murmurs.  Abdomen: soft, BS +, non tender, non distended.  Extremities: no edema, lower extremities warm to touch. Neurologic:Quadriplegic Skin: No Rash.   Wounds: N/A.    Intake/Output from previous day:  Intake/Output Summary (Last 24 hours) at 04/24/12 1044 Last data filed at 04/24/12 0853  Gross per 24 hour  Intake 1092.5 ml  Output   1225 ml  Net -132.5 ml     LAB RESULTS: CBC  Lab 04/24/12 0336 04/23/12 0500 04/22/12 0900 04/18/12 2344  WBC 10.5 10.6* 16.9* --  HGB 7.7* 6.9* 8.1* 9.2*  HCT 25.3* 22.2* 26.2* 27.0*  PLT 484* 453* 569* --  MCV 73.3* 72.3* 71.4* --  MCH 22.3* 22.5* 22.1* --  MCHC 30.4 31.1 30.9 --  RDW 18.1* 17.5* 17.5* --  LYMPHSABS -- -- 2.7 --  MONOABS -- -- 1.7* --  EOSABS -- -- 0.3 --  BASOSABS -- -- 0.0 --  BANDABS -- -- -- --    Chemistries   Lab 04/24/12 0336 04/23/12 0500 04/22/12 0900 04/18/12 2344  NA 137 138 136 139  K 3.6 3.9 3.5 3.7  CL 105 105 100 106  CO2 26 23 23  --  GLUCOSE 113* 117* 129* 114*  BUN 8 10 10 19   CREATININE 0.38* 0.43* 0.44* 0.60  CALCIUM 9.0 8.9 9.7 --  MG -- -- -- --    CBG:  Lab 04/22/12 1211  GLUCAP 107*    GFR Estimated Creatinine Clearance: 151.4 ml/min (by C-G formula based on Cr of 0.38).  Coagulation profile No results found for this basename: INR:5,PROTIME:5 in the last 168 hours  Cardiac Enzymes No results found for this basename: CK:3,CKMB:3,TROPONINI:3,MYOGLOBIN:3 in the last 168 hours  No components found with this basename: POCBNP:3 No results found for this basename: DDIMER:2 in the last 72 hours No results found for this basename: HGBA1C:2 in the last 72 hours No results found for this basename: CHOL:2,HDL:2,LDLCALC:2,TRIG:2,CHOLHDL:2,LDLDIRECT:2 in the last 72 hours No results found for this basename: TSH,T4TOTAL,FREET3,T3FREE,THYROIDAB in the last 72  hours  Basename 04/23/12 1030  VITAMINB12 1978*  FOLATE >20.0  FERRITIN 413*  TIBC Not calculated due to Iron <10.  IRON <10*  RETICCTPCT 1.4   No results found for this basename: LIPASE:2,AMYLASE:2 in the last 72 hours  Urine Studies No results found for this basename: UACOL:2,UAPR:2,USPG:2,UPH:2,UTP:2,UGL:2,UKET:2,UBIL:2,UHGB:2,UNIT:2,UROB:2,ULEU:2,UEPI:2,UWBC:2,URBC:2,UBAC:2,CAST:2,CRYS:2,UCOM:2,BILUA:2 in the last 72 hours  MICROBIOLOGY: Recent Results (from the past 240 hour(s))  URINE CULTURE     Status: Normal   Collection Time   04/18/12  8:27 PM      Component Value Range Status Comment   Specimen Description URINE, CATHETERIZED   Final    Special Requests NONE   Final    Culture  Setup Time 04/19/2012 02:25   Final    Colony Count >=100,000 COLONIES/ML   Final    Culture CITROBACTER FREUNDII   Final    Report Status 04/20/2012 FINAL   Final    Organism ID, Bacteria CITROBACTER FREUNDII   Final   URINE CULTURE     Status: Normal (Preliminary result)   Collection Time   04/22/12  7:36 AM      Component Value Range Status Comment   Specimen Description URINE, CATHETERIZED   Final    Special Requests NONE   Final    Culture  Setup Time 04/22/2012 15:22   Final    Colony Count PENDING   Incomplete    Culture Culture reincubated for better growth   Final    Report Status PENDING   Incomplete   CULTURE, BLOOD (ROUTINE X 2)     Status: Normal (Preliminary result)   Collection Time   04/22/12  8:55 AM      Component Value Range Status Comment   Specimen Description BLOOD RIGHT ARM   Final    Special Requests BOTTLES DRAWN AEROBIC ONLY 4CC   Final    Culture  Setup Time 04/22/2012 15:26   Final    Culture     Final    Value:        BLOOD CULTURE RECEIVED NO GROWTH TO DATE CULTURE WILL BE HELD FOR 5 DAYS BEFORE ISSUING A FINAL NEGATIVE REPORT   Report Status PENDING   Incomplete   CULTURE, BLOOD (ROUTINE X 2)     Status: Normal   Collection Time   04/22/12  4:00 PM       Component Value Range Status Comment   Specimen Description BLOOD LEFT ARM   Final    Special Requests BOTTLES DRAWN AEROBIC AND ANAEROBIC 10CC   Final    Culture  Setup Time 04/22/2012 20:57   Final    Culture     Final    Value:        BLOOD CULTURE RECEIVED NO GROWTH TO DATE CULTURE WILL BE HELD FOR 5 DAYS BEFORE ISSUING A FINAL NEGATIVE REPORT   Report Status 04/23/2012 FINAL   Final  RADIOLOGY STUDIES/RESULTS: Dg Chest 2 View  04/22/2012  *RADIOLOGY REPORT*  Clinical Data: Chest pain and weakness.  CHEST - 2 VIEW  Comparison: 03/17/2012 and chest CT 10/26/2010  Findings: Two views of the chest were obtained.  Again noted are old left rib fractures.  In addition, there are chronic densities in the right upper chest related to the right scapula and posterior ribs based on previous CT.  Limited evaluation of the lung bases due to patient positioning and body habitus.  Heart and mediastinum appear grossly stable.  Postsurgical changes in the lower cervical spine.  IMPRESSION: Limited examination without acute findings.  There are chronic densities in the right upper chest as described. Evidence of old left rib fractures.   Original Report Authenticated By: Richarda Overlie, M.D.    Ir Fluoro Guide Cv Line Right  04/22/2012  *RADIOLOGY REPORT*  Clinical Data: 46 year old with sepsis.  PICC LINE PLACEMENT WITH ULTRASOUND AND FLUOROSCOPIC  GUIDANCE  Fluoroscopy Time: 1.1 minutes.  The right arm was prepped with chlorhexidine, draped in the usual sterile fashion using maximum barrier technique (cap and mask, sterile gown, sterile gloves, large sterile sheet, hand hygiene and cutaneous antisepsis) and infiltrated locally with 1% Lidocaine.  Ultrasound demonstrated patency of the right brachial vein, and this was documented with an image.  Under real-time ultrasound guidance, this vein was accessed with a 21 gauge micropuncture needle and image documentation was performed.  The needle was exchanged over a  guidewire for a peel-away sheath through which a five [place] dual lumen PICC trimmed to 52 cm was advanced, positioned with its tip at the lower SVC/right atrial junction. Fluoroscopy during the procedure and fluoro spot radiograph confirms appropriate catheter position.  The catheter was flushed, secured to the skin with Prolene sutures, and covered with a sterile dressing.  Complications:  None  IMPRESSION: Successful right arm PICC line placement with ultrasound and fluoroscopic guidance.  The catheter is ready for use.   Original Report Authenticated By: Richarda Overlie, M.D.    Ir US Guide Vasc Access Right  04/22/2012  *RADIOLOGY REPORT*  Clinical Data: 46 year old with sepsis.  PICC LINE PLACEMENT WITH ULTRASOUND AND FLUOROSCOPIC  GUIDANCE  Fluoroscopy Time: 1.1 minutes.  The right arm was prepped with chlorhexidine, draped in the usual sterile fashion using maximum barrier technique (cap and mask, sterile gown, sterile gloves, large sterile sheet, hand hygiene and cutaneous antisepsis) and infiltrated locally with 1% Lidocaine.  Ultrasound demonstrated patency of the right brachial vein, and this was documented with an image.  Under real-time ultrasound guidance, this vein was accessed with a 21 gauge micropuncture needle and image documentation was performed.  The needle was exchanged over a guidewire for a peel-away sheath through which a five [place] dual lumen PICC trimmed to 52 cm was advanced, positioned with its tip at the lower SVC/right atrial junction. Fluoroscopy during the procedure and fluoro spot radiograph confirms appropriate catheter position.  The catheter was flushed, secured to the skin with Prolene sutures, and covered with a sterile dressing.  Complications:  None  IMPRESSION: Successful right arm PICC line placement with ultrasound and fluoroscopic guidance.  The catheter is ready for use.   Original Report Authenticated By: Richarda Overlie, M.D.     MEDICATIONS: Scheduled Meds:    .  baclofen  20 mg Oral QID  . cefTRIAXone (ROCEPHIN)  IV  1 g Intravenous Q24H  . docusate sodium  100 mg Oral BID  . doxycycline  100 mg Oral BID  .  enoxaparin (LOVENOX) injection  40 mg Subcutaneous Q24H  . famotidine  20 mg Oral BID  . ferrous sulfate  325 mg Oral TID WC  . furosemide  20 mg Oral Daily  . metoCLOPramide  10 mg Oral BID WC  . multivitamin with minerals  1 tablet Oral Daily  . potassium chloride SA  20 mEq Oral Daily  . sodium chloride  3 mL Intravenous Q12H  . vitamin C  500 mg Oral BID  . zinc sulfate  220 mg Oral Daily  . [DISCONTINUED] enoxaparin (LOVENOX) injection  40 mg Subcutaneous Q24H   Continuous Infusions:    . sodium chloride 20 mL/hr at 04/23/12 2200  . [DISCONTINUED] sodium chloride     PRN Meds:.acetaminophen, acetaminophen, albuterol, alum & mag hydroxide-simeth, ondansetron (ZOFRAN) IV, ondansetron, sodium chloride  Antibiotics: Anti-infectives     Start     Dose/Rate Route Frequency Ordered Stop   04/22/12 1430   doxycycline (VIBRA-TABS) tablet 100 mg        100 mg Oral 2 times daily 04/22/12 1429     04/22/12 1015   cefTRIAXone (ROCEPHIN) 1 g in dextrose 5 % 50 mL IVPB        1 g 100 mL/hr over 30 Minutes Intravenous Every 24 hours 04/22/12 1008             Jeoffrey Massed, MD  [facility] Pager:[phone]  If 7PM-7AM, please contact night-coverage [email or web address] Password [email or web address] 04/24/2012, 10:44 AM   LOS: 2 days

## 2012-04-24 NOTE — Progress Notes (Signed)
Patient evaluated for long-term disease management services with [facility] as benefit of his [employer]. Although Mr Cosgriff does not have the co-morbities that [facility] Care Management usually follows, he has been in the emergency room 8 times within the past 6 months. He states he comes in for frequent UTIs. He lives with a friend but reports he will be moving soon. Consents signed at bedside. Confirmed his best contact number and explained that he will receive a post discharge transition of care call and be evaluated for monthly home visits for assessments and for education.      Arvella Merles, RN,BSN [facility] [phone]

## 2012-04-25 LAB — BASIC METABOLIC PANEL
BUN: 8 mg/dL (ref 6–23)
Calcium: 9 mg/dL (ref 8.4–10.5)
Creatinine, Ser: 0.44 mg/dL — ABNORMAL LOW (ref 0.50–1.35)
GFR calc non Af Amer: >90 mL/min (ref >90)
Glucose, Bld: 109 mg/dL — ABNORMAL HIGH (ref 70–99)

## 2012-04-25 LAB — CBC
HCT: 24.8 % — ABNORMAL LOW (ref 39.0–52.0)
Hemoglobin: 7.6 g/dL — ABNORMAL LOW (ref 13.0–17.0)
MCH: 22.4 pg — ABNORMAL LOW (ref 26.0–34.0)
MCHC: 30.6 g/dL (ref 30.0–36.0)
MCV: 73.2 fL — ABNORMAL LOW (ref 78.0–100.0)

## 2012-04-25 LAB — URINE CULTURE

## 2012-04-25 MED ORDER — POTASSIUM CHLORIDE CRYS ER 20 MEQ PO TBCR
60.0000 meq | EXTENDED_RELEASE_TABLET | Freq: Once | ORAL | Status: AC
  Administered 2012-04-25: 60 meq via ORAL
  Filled 2012-04-25: qty 3

## 2012-04-25 MED ORDER — CEFEPIME HCL 2 G IJ SOLR
2.0000 g | Freq: Two times a day (BID) | INTRAMUSCULAR | Status: DC
  Administered 2012-04-25 – 2012-04-26 (×3): 2 g via INTRAVENOUS
  Filled 2012-04-25 (×4): qty 2

## 2012-04-25 NOTE — Progress Notes (Signed)
PATIENT DETAILS Name: [name] [name] Age: 46 y.o. Sex: male Date of Birth: 05/05/1966 Admit Date: 04/22/2012 Admitting Physician Sorin Luanne Bras, MD ZOX:[id number],UJWJX N, MD  Subjective: Still has low-grade fever.  Assessment/Plan: Principal Problem:  *Sepsis -from UTI -treat with IVF and antibiotics-better compared to 12/8 -Urine culture showed both Citrobacter freundii and Pseudomonas aeruginosa. -I will switch to cefepime, patient will be likely discharged home in the morning.  Active Problems: UTI -Complicated UTI, related to neurogenic bladder suprapubic catheter. -await Urine cultures drawn on admission- culture now re incubated  - Low grade fever overnight, however does not look toxic -has underlying Supra-pubic catheter  Anemia -significant drop in Hb-there is no evidence of overt blood loss -?2/2 to IV Hydration, with likely anemia of chronic disease and iron deficiency at  baseline - Status post 1 unit of PRBC transfusion on 12/9, hemoglobin up to 7.7 today -on Fe supplementation - Monitor CBC periodically  Chronic Osteo sacrum/coccyx -on Doxycycline-continue  Sacral Decubitus Ulcers -chronic issue -appreciate wound care input -Patient to followup with wound center as outpatient, home nurse for dressing changes.  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 -with supra pubic catheter and diverting colostomy -on Baclofen for Spasms  HTN -on lasix  Disposition: Remain inpatient  DVT Prophylaxis: Prophylactic Lovenox   Code Status: Full code  Procedures: PICC -12/8  CONSULTS:  None  PHYSICAL EXAM: Vital signs in last 24 hours: Filed Vitals:   04/24/12 0527 04/24/12 1517 04/24/12 2128 04/25/12 0526  BP: 110/53 106/71 123/78 92/62  Pulse: 96 90 101 91  Temp: 100.1 F (37.8 C) 98.9 F (37.2 C) 100.7 F (38.2 C) 98.7 F (37.1 C)  TempSrc: Oral Oral Oral Axillary  Resp: 20 18 20 18   Height:      Weight:      SpO2: 99% 99% 96% 98%     Weight change:  Body mass index is 33.79 kg/(m^2).   Gen Exam: Awake and alert with clear speech.   Neck: Supple, No JVD.   Chest: B/L Clear.   CVS: S1 S2 Regular, no murmurs.  Abdomen: soft, BS +, non tender, non distended.  Extremities: no edema, lower extremities warm to touch. Neurologic:Quadriplegic Skin: No Rash.   Wounds: N/A.    Intake/Output from previous day:  Intake/Output Summary (Last 24 hours) at 04/25/12 1310 Last data filed at 04/25/12 0845  Gross per 24 hour  Intake   2400 ml  Output   3000 ml  Net   -600 ml     LAB RESULTS: CBC  Lab 04/25/12 0500 04/24/12 0336 04/23/12 0500 04/22/12 0900 04/18/12 2344  WBC 13.4* 10.5 10.6* 16.9* --  HGB 7.6* 7.7* 6.9* 8.1* 9.2*  HCT 24.8* 25.3* 22.2* 26.2* 27.0*  PLT 466* 484* 453* 569* --  MCV 73.2* 73.3* 72.3* 71.4* --  MCH 22.4* 22.3* 22.5* 22.1* --  MCHC 30.6 30.4 31.1 30.9 --  RDW 18.3* 18.1* 17.5* 17.5* --  LYMPHSABS -- -- -- 2.7 --  MONOABS -- -- -- 1.7* --  EOSABS -- -- -- 0.3 --  BASOSABS -- -- -- 0.0 --  BANDABS -- -- -- -- --    Chemistries   Lab 04/25/12 0500 04/24/12 0336 04/23/12 0500 04/22/12 0900 04/18/12 2344  NA 138 137 138 136 139  K 3.3* 3.6 3.9 3.5 3.7  CL 103 105 105 100 106  CO2 25 26 23 23  --  GLUCOSE 109* 113* 117* 129* 114*  BUN 8 8 10  10  19  CREATININE 0.44* 0.38* 0.43* 0.44* 0.60  CALCIUM 9.0 9.0 8.9 9.7 --  MG -- -- -- -- --    CBG:  Lab 04/22/12 1211  GLUCAP 107*    GFR Estimated Creatinine Clearance: 151.4 ml/min (by C-G formula based on Cr of 0.44).  Coagulation profile No results found for this basename: INR:5,PROTIME:5 in the last 168 hours  Cardiac Enzymes No results found for this basename: CK:3,CKMB:3,TROPONINI:3,MYOGLOBIN:3 in the last 168 hours  No components found with this basename: POCBNP:3 No results found for this basename: DDIMER:2 in the last 72 hours No results found for this basename: HGBA1C:2 in the last 72 hours No results found for  this basename: CHOL:2,HDL:2,LDLCALC:2,TRIG:2,CHOLHDL:2,LDLDIRECT:2 in the last 72 hours No results found for this basename: TSH,T4TOTAL,FREET3,T3FREE,THYROIDAB in the last 72 hours  Basename 04/23/12 1030  VITAMINB12 1978*  FOLATE >20.0  FERRITIN 413*  TIBC Not calculated due to Iron <10.  IRON <10*  RETICCTPCT 1.4   No results found for this basename: LIPASE:2,AMYLASE:2 in the last 72 hours  Urine Studies No results found for this basename: UACOL:2,UAPR:2,USPG:2,UPH:2,UTP:2,UGL:2,UKET:2,UBIL:2,UHGB:2,UNIT:2,UROB:2,ULEU:2,UEPI:2,UWBC:2,URBC:2,UBAC:2,CAST:2,CRYS:2,UCOM:2,BILUA:2 in the last 72 hours  MICROBIOLOGY: Recent Results (from the past 240 hour(s))  URINE CULTURE     Status: Normal   Collection Time   04/18/12  8:27 PM      Component Value Range Status Comment   Specimen Description URINE, CATHETERIZED   Final    Special Requests NONE   Final    Culture  Setup Time 04/19/2012 02:25   Final    Colony Count >=100,000 COLONIES/ML   Final    Culture CITROBACTER FREUNDII   Final    Report Status 04/20/2012 FINAL   Final    Organism ID, Bacteria CITROBACTER FREUNDII   Final   URINE CULTURE     Status: Normal   Collection Time   04/22/12  7:36 AM      Component Value Range Status Comment   Specimen Description URINE, CATHETERIZED   Final    Special Requests NONE   Final    Culture  Setup Time 04/22/2012 15:22   Final    Colony Count >=100,000 COLONIES/ML   Final    Culture     Final    Value: CITROBACTER FREUNDII     PSEUDOMONAS AERUGINOSA   Report Status 04/25/2012 FINAL   Final    Organism ID, Bacteria CITROBACTER FREUNDII   Final    Organism ID, Bacteria PSEUDOMONAS AERUGINOSA   Final   CULTURE, BLOOD (ROUTINE X 2)     Status: Normal (Preliminary result)   Collection Time   04/22/12  8:55 AM      Component Value Range Status Comment   Specimen Description BLOOD RIGHT ARM   Final    Special Requests BOTTLES DRAWN AEROBIC ONLY 4CC   Final    Culture  Setup Time  04/22/2012 15:26   Final    Culture     Final    Value:        BLOOD CULTURE RECEIVED NO GROWTH TO DATE CULTURE WILL BE HELD FOR 5 DAYS BEFORE ISSUING A FINAL NEGATIVE REPORT   Report Status PENDING   Incomplete   CULTURE, BLOOD (ROUTINE X 2)     Status: Normal   Collection Time   04/22/12  4:00 PM      Component Value Range Status Comment   Specimen Description BLOOD LEFT ARM   Final    Special Requests BOTTLES DRAWN AEROBIC AND ANAEROBIC 10CC   Final  Culture  Setup Time 04/22/2012 20:57   Final    Culture     Final    Value:        BLOOD CULTURE RECEIVED NO GROWTH TO DATE CULTURE WILL BE HELD FOR 5 DAYS BEFORE ISSUING A FINAL NEGATIVE REPORT   Report Status 04/23/2012 FINAL   Final     RADIOLOGY STUDIES/RESULTS: Dg Chest 2 View  04/22/2012  *RADIOLOGY REPORT*  Clinical Data: Chest pain and weakness.  CHEST - 2 VIEW  Comparison: 03/17/2012 and chest CT 10/26/2010  Findings: Two views of the chest were obtained.  Again noted are old left rib fractures.  In addition, there are chronic densities in the right upper chest related to the right scapula and posterior ribs based on previous CT.  Limited evaluation of the lung bases due to patient positioning and body habitus.  Heart and mediastinum appear grossly stable.  Postsurgical changes in the lower cervical spine.  IMPRESSION: Limited examination without acute findings.  There are chronic densities in the right upper chest as described. Evidence of old left rib fractures.   Original Report Authenticated By: Richarda Overlie, M.D.    Ir Fluoro Guide Cv Line Right  04/22/2012  *RADIOLOGY REPORT*  Clinical Data: 46 year old with sepsis.  PICC LINE PLACEMENT WITH ULTRASOUND AND FLUOROSCOPIC  GUIDANCE  Fluoroscopy Time: 1.1 minutes.  The right arm was prepped with chlorhexidine, draped in the usual sterile fashion using maximum barrier technique (cap and mask, sterile gown, sterile gloves, large sterile sheet, hand hygiene and cutaneous antisepsis) and  infiltrated locally with 1% Lidocaine.  Ultrasound demonstrated patency of the right brachial vein, and this was documented with an image.  Under real-time ultrasound guidance, this vein was accessed with a 21 gauge micropuncture needle and image documentation was performed.  The needle was exchanged over a guidewire for a peel-away sheath through which a five [place] dual lumen PICC trimmed to 52 cm was advanced, positioned with its tip at the lower SVC/right atrial junction. Fluoroscopy during the procedure and fluoro spot radiograph confirms appropriate catheter position.  The catheter was flushed, secured to the skin with Prolene sutures, and covered with a sterile dressing.  Complications:  None  IMPRESSION: Successful right arm PICC line placement with ultrasound and fluoroscopic guidance.  The catheter is ready for use.   Original Report Authenticated By: Richarda Overlie, M.D.    Ir US Guide Vasc Access Right  04/22/2012  *RADIOLOGY REPORT*  Clinical Data: 46 year old with sepsis.  PICC LINE PLACEMENT WITH ULTRASOUND AND FLUOROSCOPIC  GUIDANCE  Fluoroscopy Time: 1.1 minutes.  The right arm was prepped with chlorhexidine, draped in the usual sterile fashion using maximum barrier technique (cap and mask, sterile gown, sterile gloves, large sterile sheet, hand hygiene and cutaneous antisepsis) and infiltrated locally with 1% Lidocaine.  Ultrasound demonstrated patency of the right brachial vein, and this was documented with an image.  Under real-time ultrasound guidance, this vein was accessed with a 21 gauge micropuncture needle and image documentation was performed.  The needle was exchanged over a guidewire for a peel-away sheath through which a five [place] dual lumen PICC trimmed to 52 cm was advanced, positioned with its tip at the lower SVC/right atrial junction. Fluoroscopy during the procedure and fluoro spot radiograph confirms appropriate catheter position.  The catheter was flushed, secured to the skin  with Prolene sutures, and covered with a sterile dressing.  Complications:  None  IMPRESSION: Successful right arm PICC line placement with ultrasound and fluoroscopic guidance.  The catheter is ready for use.   Original Report Authenticated By: Richarda Overlie, M.D.     MEDICATIONS: Scheduled Meds:    . baclofen  20 mg Oral QID  . ceFEPime (MAXIPIME) IV  2 g Intravenous Q12H  . docusate sodium  100 mg Oral BID  . doxycycline  100 mg Oral BID  . enoxaparin (LOVENOX) injection  40 mg Subcutaneous Q24H  . famotidine  20 mg Oral BID  . feeding supplement  30 mL Oral BID  . ferrous sulfate  325 mg Oral TID WC  . furosemide  20 mg Oral Daily  . metoCLOPramide  10 mg Oral BID WC  . multivitamin with minerals  1 tablet Oral Daily  . potassium chloride SA  20 mEq Oral Daily  . [COMPLETED] potassium chloride  60 mEq Oral Once  . sodium chloride  3 mL Intravenous Q12H  . vitamin C  500 mg Oral BID  . zinc sulfate  220 mg Oral Daily  . [DISCONTINUED] cefTRIAXone (ROCEPHIN)  IV  1 g Intravenous Q24H   Continuous Infusions:    . sodium chloride 20 mL/hr at 04/25/12 0612   PRN Meds:.acetaminophen, acetaminophen, albuterol, alum & mag hydroxide-simeth, ondansetron (ZOFRAN) IV, ondansetron, sodium chloride  Antibiotics: Anti-infectives     Start     Dose/Rate Route Frequency Ordered Stop   04/25/12 1000   ceFEPIme (MAXIPIME) 2 g in dextrose 5 % 50 mL IVPB        2 g 100 mL/hr over 30 Minutes Intravenous Every 12 hours 04/25/12 0934     04/22/12 1430   doxycycline (VIBRA-TABS) tablet 100 mg        100 mg Oral 2 times daily 04/22/12 1429     04/22/12 1015   cefTRIAXone (ROCEPHIN) 1 g in dextrose 5 % 50 mL IVPB  Status:  Discontinued        1 g 100 mL/hr over 30 Minutes Intravenous Every 24 hours 04/22/12 1008 04/25/12 0916           Cris Gibby A, MD  [facility] Pager:[phone]  If 7PM-7AM, please contact night-coverage [email or web address] Password [email or web address] 04/25/2012,  1:10 PM   LOS: 3 days

## 2012-04-25 NOTE — Consult Note (Signed)
WOC follow up  Requested to discuss wound care needs with pt.  Pt currently with worsening of his pressure ulcers on the right ischial and trochanter pressure ulcers.  Has paid CG that is currently providing wound care at home, daily calcium alginate. However these wounds seem much worse than at the time of my last assessment. I have initiated hydrotherapy in attempt to cleanse the wounds on the right, and I met with PT today to discuss progress of the wounds.  Necrotic tissue is less now and bleeding at the time of hydrotherapy seems to be an issue.  I met with MD to discuss needs for agressive wound care and continued hydrotherapy, would recommend LTAC for these needs.   I met with pt and we discussed that his wounds are worse, he is in agreement and he reports his CG has also reported the wounds are getting worse.  He denies increase in the time up in the chair.  He reports CG is changing his dressing daily, but he also reports that his CG is moving to a different house so that they can live together in order for her to better care for him.  The CG is moving this week and the plans are for his stuff to be moved from his apartment this weekend.  I also discussed with him the care and follow up needed for the extensiveness of his wounds.  He has been followed by the wound care center (Dr. Kelly Splinter), but recently was transferred from the care of Dr. Kelly Splinter to another MD at the wound care center, the pt is not clear on which MD.  He is concerned with the care he is receiving at the wound care center and feels that they have been very indecisive on his care since he has been seeing the other MD.  I have offered him options of second opinion at [facility] wound care center, however transportation to [facility] will be an issue for him.  He is agreeable to continue care at the [facility] wound care center if he can continue to see Dr. Kelly Splinter.  I will contact the center for communication on follow up for him.  He is also  agreeable to placement in LTAC for aggressive wound care, I have notified MD and CM of this and they will work on DC plan for the patient.  WOC will continue to follow along for assistance with wound care. [name] [name] [place] RN,CWOCN [phone]

## 2012-04-25 NOTE — Progress Notes (Signed)
PT HYDROTHERAPY PROGRESS NOTE     04/25/12 1300  Subjective Assessment  Subjective Pt agreeable to hydrotherapy  Patient and Family Stated Goals heal wounds  Date of Onset (prior to admission)  Prior Treatments Previous wound care; hydro therapy; dressing changes with home health  Evaluation and Treatment  Evaluation and Treatment Procedures Explained to Patient/Family Yes  Evaluation and Treatment Procedures agreed to  Pressure Ulcer 04/22/12 Stage IV - Full thickness tissue loss with exposed bone, tendon or muscle.  Date First Assessed/Time First Assessed: 04/22/12 1501   Location: Ischial tuberosity  Location Orientation: Right  Staging: Stage IV - Full thickness tissue loss with exposed bone, tendon or muscle.  Present on Admission: Yes  State of Healing Early/partial granulation  Site / Wound Assessment Dusky;Granulation tissue;Pink;Red;Yellow  % Wound base Red or Granulating 75%  % Wound base Yellow 20%  % Wound base Other (Comment) 5% (exposed muscle/tendon)  Peri-wound Assessment Maceration;Erythema (blanchable);Edema  Margins Unattacted edges (unapproximated)  Drainage Amount Moderate  Drainage Description Serosanguineous;Purulent;Odor  Treatment Cleansed;Debridement (Selective);Hydrotherapy (Pulse lavage);Packing (Saline gauze);Tape changed  Dressing Type ABD;Barrier Film (skin prep);Gauze (Comment) (Kerlex)  Dressing Clean;Intact  Pressure Ulcer 04/22/12 Stage IV - Full thickness tissue loss with exposed bone, tendon or muscle.  Date First Assessed/Time First Assessed: 04/22/12 1501   Location: Ischial tuberosity  Location Orientation: Left  Staging: Stage IV - Full thickness tissue loss with exposed bone, tendon or muscle.  Present on Admission: Yes  Dressing Type ABD  Dressing Changed;Clean;Intact  Pressure Ulcer 04/22/12 Stage II -  Partial thickness loss of dermis presenting as a shallow open ulcer with a red, pink wound bed without slough.  Date First Assessed/Time  First Assessed: 04/22/12 1445   Location: Knee  Location Orientation: Left  Staging: Stage II -  Partial thickness loss of dermis presenting as a shallow open ulcer with a red, pink wound bed without slough.  Present on Admission  Dressing Type ABD  Dressing Clean;Dry;Intact  Hydrotherapy  Pulsed Lavage with Suction (psi) 4 psi  Pulsed Lavage with Suction - Normal Saline Used 1000 mL  Pulsed Lavage Tip Tip with splash shield  Pulsed lavage therapy - wound location Right Ischial Wound  Selective Debridement  Selective Debridement - Location Right Ischial tuberosity wound margins  Selective Debridement - Tools Used Scissors;Forceps  Selective Debridement - Tissue Removed slough and maceratic necrotic tissue  Wound Therapy - Assess/Plan/Recommendations  Wound Therapy - Clinical Statement Pt tolerated hydrotherapy well; wound bed continues to present with moderate bleeding which seems to coagulate as soon as it reaches air exposure forming a gelatinous film over the wound bed despite lowest pressure for PLS. Film cleaned off with gauze padding prior to dressing aplplication. Pt presents with multiple pressue wounds.  Addressing Right ischial tuberosity with PLS and selective debridement.  Wound bed presents with necrotic margins and tunneling.  Rec continued hydrotherapy to cleanse wound and facilitate debridement and healing. Will use selective debridement to further eliminate unviable tissue from wound margins.   Wound Therapy - Functional Problem List decreased skin integrity secondary to immobility  Factors Delaying/Impairing Wound Healing Altered sensation;Diabetes Mellitus;Infection - systemic/local;Immobility;Multiple medical problems;Vascular compromise  Hydrotherapy Plan Debridement;Dressing change;Patient/family education;Pulsatile lavage with suction  Wound Therapy - Frequency 6X / week  Wound Therapy - Follow Up Recommendations Home health RN  Wound Plan Continue hydrotherapy as indicated   Wound Therapy Goals - Improve the function of patient's integumentary system by progressing the wound(s) through the phases of wound healing by:  Decrease  Necrotic Tissue to 10%  Decrease Necrotic Tissue - Progress Progressing toward goal  Increase Granulation Tissue to 90%  Increase Granulation Tissue - Progress Progressing toward goal  Goals/treatment plan/discharge plan were made with and agreed upon by patient/family Yes  Time For Goal Achievement 7 days  Wound Therapy - Potential for Goals Fair     Charlotte Crumb, PT DPT  [phone]

## 2012-04-26 DIAGNOSIS — Z9359 Other cystostomy status: Secondary | ICD-10-CM

## 2012-04-26 LAB — CBC
Hemoglobin: 7.8 g/dL — ABNORMAL LOW (ref 13.0–17.0)
MCH: 22.7 pg — ABNORMAL LOW (ref 26.0–34.0)
MCHC: 30.2 g/dL (ref 30.0–36.0)
MCV: 75 fL — ABNORMAL LOW (ref 78.0–100.0)
RBC: 3.44 MIL/uL — ABNORMAL LOW (ref 4.22–5.81)

## 2012-04-26 LAB — BASIC METABOLIC PANEL
BUN: 8 mg/dL (ref 6–23)
Creatinine, Ser: 0.39 mg/dL — ABNORMAL LOW (ref 0.50–1.35)
GFR calc Af Amer: >90 mL/min (ref >90)
GFR calc non Af Amer: >90 mL/min (ref >90)
Glucose, Bld: 106 mg/dL — ABNORMAL HIGH (ref 70–99)

## 2012-04-26 MED ORDER — HEPARIN SOD (PORK) LOCK FLUSH 100 UNIT/ML IV SOLN
250.0000 [IU] | INTRAVENOUS | Status: DC | PRN
  Administered 2012-04-26: 250 [IU]
  Filled 2012-04-26: qty 3

## 2012-04-26 MED ORDER — DEXTROSE 5 % IV SOLN: 2.0000 g | Freq: Two times a day (BID) | INTRAVENOUS | Status: DC

## 2012-04-26 MED ORDER — HEPARIN SOD (PORK) LOCK FLUSH 100 UNIT/ML IV SOLN
250.0000 [IU] | Freq: Two times a day (BID) | INTRAVENOUS | Status: DC
  Filled 2012-04-26: qty 3

## 2012-04-26 NOTE — Progress Notes (Signed)
[name] [name] discharged Home per MD order.  Discharge instructions reviewed and discussed with the patient, all questions and concerns answered. Copy of instructions to patient.   [name] [name], [name] [name]  Home Medication Instructions ZOX:[id number]   Printed on:04/26/12 1828  Medication Information                    vitamin [name] (ASCORBIC ACID) 500 MG tablet Take 500 mg by mouth 2 (two) times daily.            baclofen (LIORESAL) 20 MG tablet Take 20 mg by mouth 4 (four) times daily.            metoCLOPramide (REGLAN) 10 MG tablet Take 10 mg by mouth 2 (two) times daily with a meal.            famotidine (PEPCID) 20 MG tablet Take 20 mg by mouth 2 (two) times daily.             docusate sodium (COLACE) 100 MG capsule Take 100 mg by mouth 2 (two) times daily.           Multiple Vitamin (MULTIVITAMIN WITH MINERALS) TABS Take 1 tablet by mouth daily.           zinc gluconate 50 MG tablet Take 50 mg by mouth 2 (two) times daily. For wound healing.           ferrous sulfate 325 (65 FE) MG tablet Take 1 tablet (325 mg total) by mouth 3 (three) times daily with meals.           doxycycline (VIBRA-TABS) 100 MG tablet Take 1 tablet (100 mg total) by mouth 2 (two) times daily.           albuterol (PROVENTIL) (2.5 MG/3ML) 0.083% nebulizer solution Take 2.5 mg by nebulization every 6 (six) hours as needed. Shortness of breath           furosemide (LASIX) 20 MG tablet Take 20 mg by mouth daily.           potassium chloride SA (K-DUR,KLOR-CON) 20 MEQ tablet Take 20 mEq by mouth daily.           dextrose 5 % SOLN 50 mL with ceFEPIme 2 G SOLR 2 g Inject 2 g into the vein every 12 (twelve) hours.             Patients with stage 4 pressure ulcer to sacrum and stage 2 to left knee.  Dsg. Changed. DL PICC catheter remains intact for continued IV abx. At home. Site without signs and symptoms of complications.  Patient escorted to home by [facility] via stretcher,  no distress noted upon  discharge.  [name] [name], [name] [name] 04/26/2012 6:28 PM
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
## 2012-04-28 LAB — CULTURE, BLOOD (ROUTINE X 2): Culture: NO GROWTH

## 2012-04-30 LAB — CULTURE, BLOOD (ROUTINE X 2)

## 2012-05-04 ENCOUNTER — Inpatient Hospital Stay (HOSPITAL_COMMUNITY)
Admission: EM | Admit: 2012-05-04 | Discharge: 2012-05-22 | DRG: 853 | Disposition: A | Discharge status: Home / Self Care | Payer: Medicare Other | Attending: Internal Medicine | Admitting: Internal Medicine

## 2012-05-04 ENCOUNTER — Emergency Department (HOSPITAL_COMMUNITY): Payer: Medicare Other

## 2012-05-04 ENCOUNTER — Inpatient Hospital Stay (HOSPITAL_COMMUNITY): Payer: Medicare Other

## 2012-05-04 ENCOUNTER — Encounter (HOSPITAL_COMMUNITY): Payer: Self-pay | Admitting: Emergency Medicine

## 2012-05-04 DIAGNOSIS — E669 Obesity, unspecified: Secondary | ICD-10-CM

## 2012-05-04 DIAGNOSIS — Z9359 Other cystostomy status: Secondary | ICD-10-CM

## 2012-05-04 DIAGNOSIS — M869 Osteomyelitis, unspecified: Secondary | ICD-10-CM | POA: Diagnosis present

## 2012-05-04 DIAGNOSIS — G40909 Epilepsy, unspecified, not intractable, without status epilepticus: Secondary | ICD-10-CM

## 2012-05-04 DIAGNOSIS — J189 Pneumonia, unspecified organism: Secondary | ICD-10-CM

## 2012-05-04 DIAGNOSIS — K209 Esophagitis, unspecified without bleeding: Secondary | ICD-10-CM | POA: Diagnosis present

## 2012-05-04 DIAGNOSIS — I739 Peripheral vascular disease, unspecified: Secondary | ICD-10-CM

## 2012-05-04 DIAGNOSIS — A419 Sepsis, unspecified organism: Secondary | ICD-10-CM

## 2012-05-04 DIAGNOSIS — K560 Paralytic ileus: Secondary | ICD-10-CM | POA: Diagnosis not present

## 2012-05-04 DIAGNOSIS — L02219 Cutaneous abscess of trunk, unspecified: Secondary | ICD-10-CM | POA: Diagnosis present

## 2012-05-04 DIAGNOSIS — N390 Urinary tract infection, site not specified: Secondary | ICD-10-CM

## 2012-05-04 DIAGNOSIS — G8250 Quadriplegia, unspecified: Secondary | ICD-10-CM

## 2012-05-04 DIAGNOSIS — K3184 Gastroparesis: Secondary | ICD-10-CM | POA: Diagnosis present

## 2012-05-04 DIAGNOSIS — L89109 Pressure ulcer of unspecified part of back, unspecified stage: Secondary | ICD-10-CM | POA: Diagnosis present

## 2012-05-04 DIAGNOSIS — L8994 Pressure ulcer of unspecified site, stage 4: Secondary | ICD-10-CM

## 2012-05-04 DIAGNOSIS — Z933 Colostomy status: Secondary | ICD-10-CM

## 2012-05-04 DIAGNOSIS — E871 Hypo-osmolality and hyponatremia: Secondary | ICD-10-CM

## 2012-05-04 DIAGNOSIS — D649 Anemia, unspecified: Secondary | ICD-10-CM

## 2012-05-04 DIAGNOSIS — E876 Hypokalemia: Secondary | ICD-10-CM | POA: Diagnosis not present

## 2012-05-04 DIAGNOSIS — L03319 Cellulitis of trunk, unspecified: Secondary | ICD-10-CM | POA: Diagnosis present

## 2012-05-04 DIAGNOSIS — I10 Essential (primary) hypertension: Secondary | ICD-10-CM

## 2012-05-04 DIAGNOSIS — D72829 Elevated white blood cell count, unspecified: Secondary | ICD-10-CM

## 2012-05-04 DIAGNOSIS — L89159 Pressure ulcer of sacral region, unspecified stage: Secondary | ICD-10-CM

## 2012-05-04 DIAGNOSIS — L89154 Pressure ulcer of sacral region, stage 4: Secondary | ICD-10-CM

## 2012-05-04 DIAGNOSIS — G4733 Obstructive sleep apnea (adult) (pediatric): Secondary | ICD-10-CM

## 2012-05-04 LAB — URINE MICROSCOPIC-ADD ON

## 2012-05-04 LAB — CBC WITH DIFFERENTIAL/PLATELET
Basophils Absolute: 0 10*3/uL (ref 0.0–0.1)
Basophils Relative: 0 % (ref 0–1)
Eosinophils Absolute: 0.4 10*3/uL (ref 0.0–0.7)
HCT: 22.8 % — ABNORMAL LOW (ref 39.0–52.0)
Hemoglobin: 7.2 g/dL — ABNORMAL LOW (ref 13.0–17.0)
Lymphs Abs: 2.7 10*3/uL (ref 0.7–4.0)
MCH: 22.8 pg — ABNORMAL LOW (ref 26.0–34.0)
MCHC: 31.6 g/dL (ref 30.0–36.0)
MCV: 72.2 fL — ABNORMAL LOW (ref 78.0–100.0)
Monocytes Absolute: 2.3 10*3/uL — ABNORMAL HIGH (ref 0.1–1.0)
Neutro Abs: 12.5 10*3/uL — ABNORMAL HIGH (ref 1.7–7.7)
RDW: 17.6 % — ABNORMAL HIGH (ref 11.5–15.5)

## 2012-05-04 LAB — URINALYSIS, ROUTINE W REFLEX MICROSCOPIC
Bilirubin Urine: NEGATIVE
Glucose, UA: NEGATIVE mg/dL
Ketones, ur: NEGATIVE mg/dL
Protein, ur: NEGATIVE mg/dL
pH: 6.5 (ref 5.0–8.0)

## 2012-05-04 LAB — BASIC METABOLIC PANEL
BUN: 17 mg/dL (ref 6–23)
Creatinine, Ser: 0.41 mg/dL — ABNORMAL LOW (ref 0.50–1.35)
GFR calc Af Amer: >90 mL/min (ref >90)
GFR calc non Af Amer: >90 mL/min (ref >90)
Glucose, Bld: 115 mg/dL — ABNORMAL HIGH (ref 70–99)
Potassium: 3.8 mEq/L (ref 3.5–5.1)

## 2012-05-04 MED ORDER — FUROSEMIDE 20 MG PO TABS
20.0000 mg | ORAL_TABLET | Freq: Every day | ORAL | Status: DC
  Administered 2012-05-04 – 2012-05-07 (×4): 20 mg via ORAL
  Filled 2012-05-04 (×5): qty 1

## 2012-05-04 MED ORDER — SODIUM CHLORIDE 0.9 % IV SOLN
500.0000 mg | Freq: Four times a day (QID) | INTRAVENOUS | Status: DC
  Administered 2012-05-05 – 2012-05-08 (×15): 500 mg via INTRAVENOUS
  Filled 2012-05-04 (×20): qty 500

## 2012-05-04 MED ORDER — ACETAMINOPHEN 325 MG PO TABS
650.0000 mg | ORAL_TABLET | Freq: Four times a day (QID) | ORAL | Status: DC | PRN
  Administered 2012-05-05 – 2012-05-19 (×2): 650 mg via ORAL
  Filled 2012-05-04 (×2): qty 2

## 2012-05-04 MED ORDER — PIPERACILLIN-TAZOBACTAM 3.375 G IVPB
3.3750 g | Freq: Once | INTRAVENOUS | Status: AC
  Administered 2012-05-04: 3.375 g via INTRAVENOUS
  Filled 2012-05-04: qty 50

## 2012-05-04 MED ORDER — VANCOMYCIN HCL 500 MG IV SOLR
500.0000 mg | INTRAVENOUS | Status: AC
  Administered 2012-05-04: 500 mg via INTRAVENOUS
  Filled 2012-05-04: qty 500

## 2012-05-04 MED ORDER — ZINC GLUCONATE 50 MG PO TABS: 50.0000 mg | ORAL_TABLET | Freq: Two times a day (BID) | ORAL | Status: DC

## 2012-05-04 MED ORDER — SODIUM CHLORIDE 0.9 % IV SOLN
INTRAVENOUS | Status: DC
  Administered 2012-05-04 – 2012-05-06 (×2): via INTRAVENOUS

## 2012-05-04 MED ORDER — FERROUS SULFATE 325 (65 FE) MG PO TABS
325.0000 mg | ORAL_TABLET | Freq: Three times a day (TID) | ORAL | Status: DC
  Administered 2012-05-04 – 2012-05-08 (×13): 325 mg via ORAL
  Filled 2012-05-04 (×18): qty 1

## 2012-05-04 MED ORDER — ACETAMINOPHEN 650 MG RE SUPP: 650.0000 mg | Freq: Four times a day (QID) | RECTAL | Status: DC | PRN

## 2012-05-04 MED ORDER — POTASSIUM CHLORIDE CRYS ER 20 MEQ PO TBCR
20.0000 meq | EXTENDED_RELEASE_TABLET | Freq: Every day | ORAL | Status: DC
  Administered 2012-05-04 – 2012-05-18 (×13): 20 meq via ORAL
  Filled 2012-05-04 (×16): qty 1

## 2012-05-04 MED ORDER — VANCOMYCIN HCL IN DEXTROSE 1-5 GM/200ML-% IV SOLN
1000.0000 mg | Freq: Once | INTRAVENOUS | Status: AC
  Administered 2012-05-04: 1000 mg via INTRAVENOUS
  Filled 2012-05-04: qty 200

## 2012-05-04 MED ORDER — BACLOFEN 20 MG PO TABS
20.0000 mg | ORAL_TABLET | Freq: Four times a day (QID) | ORAL | Status: DC
  Administered 2012-05-04 – 2012-05-22 (×63): 20 mg via ORAL
  Filled 2012-05-04 (×76): qty 1

## 2012-05-04 MED ORDER — ENOXAPARIN SODIUM 40 MG/0.4ML ~~LOC~~ SOLN
40.0000 mg | SUBCUTANEOUS | Status: DC
  Administered 2012-05-05 – 2012-05-08 (×4): 40 mg via SUBCUTANEOUS
  Filled 2012-05-04 (×6): qty 0.4

## 2012-05-04 MED ORDER — FAMOTIDINE 20 MG PO TABS
20.0000 mg | ORAL_TABLET | Freq: Two times a day (BID) | ORAL | Status: DC
  Administered 2012-05-04 – 2012-05-22 (×34): 20 mg via ORAL
  Filled 2012-05-04 (×40): qty 1

## 2012-05-04 MED ORDER — SODIUM CHLORIDE 0.9 % IJ SOLN
3.0000 mL | Freq: Two times a day (BID) | INTRAMUSCULAR | Status: DC
  Administered 2012-05-04 – 2012-05-06 (×4): 3 mL via INTRAVENOUS

## 2012-05-04 MED ORDER — DOCUSATE SODIUM 100 MG PO CAPS
100.0000 mg | ORAL_CAPSULE | Freq: Two times a day (BID) | ORAL | Status: DC
  Administered 2012-05-04 – 2012-05-18 (×25): 100 mg via ORAL
  Filled 2012-05-04 (×24): qty 1

## 2012-05-04 MED ORDER — METOCLOPRAMIDE HCL 10 MG PO TABS
10.0000 mg | ORAL_TABLET | Freq: Two times a day (BID) | ORAL | Status: DC
  Administered 2012-05-04 – 2012-05-11 (×12): 10 mg via ORAL
  Filled 2012-05-04 (×20): qty 1

## 2012-05-04 MED ORDER — VITAMIN C 500 MG PO TABS
500.0000 mg | ORAL_TABLET | Freq: Two times a day (BID) | ORAL | Status: DC
  Administered 2012-05-04 – 2012-05-08 (×9): 500 mg via ORAL
  Filled 2012-05-04 (×11): qty 1

## 2012-05-04 MED ORDER — ALBUTEROL SULFATE (5 MG/ML) 0.5% IN NEBU
2.5000 mg | INHALATION_SOLUTION | Freq: Four times a day (QID) | RESPIRATORY_TRACT | Status: DC | PRN
  Filled 2012-05-04: qty 0.5

## 2012-05-04 MED ORDER — SODIUM CHLORIDE 0.9 % IV BOLUS (SEPSIS)
1000.0000 mL | INTRAVENOUS | Status: AC
  Administered 2012-05-04: 1000 mL via INTRAVENOUS

## 2012-05-04 MED ORDER — SODIUM CHLORIDE 0.9 % IV SOLN
500.0000 mg | INTRAVENOUS | Status: AC
  Administered 2012-05-04: 500 mg via INTRAVENOUS
  Filled 2012-05-04: qty 500

## 2012-05-04 MED ORDER — VANCOMYCIN HCL 10 G IV SOLR
1500.0000 mg | Freq: Two times a day (BID) | INTRAVENOUS | Status: DC
  Administered 2012-05-05 – 2012-05-22 (×34): 1500 mg via INTRAVENOUS
  Filled 2012-05-04 (×40): qty 1500

## 2012-05-04 MED ORDER — ADULT MULTIVITAMIN W/MINERALS CH
1.0000 | ORAL_TABLET | Freq: Every day | ORAL | Status: DC
  Administered 2012-05-04 – 2012-05-08 (×5): 1 via ORAL
  Filled 2012-05-04 (×6): qty 1

## 2012-05-04 MED ORDER — ZINC SULFATE 220 (50 ZN) MG PO CAPS
220.0000 mg | ORAL_CAPSULE | Freq: Two times a day (BID) | ORAL | Status: DC
  Administered 2012-05-04 – 2012-05-08 (×9): 220 mg via ORAL
  Filled 2012-05-04 (×11): qty 1

## 2012-05-04 MED ORDER — GADOBENATE DIMEGLUMINE 529 MG/ML IV SOLN
20.0000 mL | Freq: Once | INTRAVENOUS | Status: AC
  Administered 2012-05-04: 20 mL via INTRAVENOUS

## 2012-05-04 NOTE — Progress Notes (Signed)
Pt admitted to 3300 from ED. Oriented to unit and room. Safety discussed and call bell with in reach. VSS. Pt with large stage 4 decubitis ulcer in sacrum and stage 3 on left knee. Patient came into hospital with a right upper PICC line, colostomy, and suprapubic catheter. Will continue to monitor.  Elijah Birk, RN

## 2012-05-04 NOTE — ED Provider Notes (Addendum)
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 His symptoms have been persistent, nothing seems to make it better or worse, it is not associated with vomiting.  On exam the patient is obese, in mild distress with slight tachypnea, soft morbidly obese abdomen, deep purulent large sized decubitus ulcer on the sacral area as well as the right buttock.  The patient has a leukocytosis, I tachycardia and a significant large infection of the skin and buttocks. Broad spectrum antibiotics have been ordered, the patient will require a step down bed due to persistent hypotension. He has received fluid boluses, intravenous antibiotics and frequent repeat evaluations and repeat vital sign checks. His care was discussed with the consultant, he has been given critical care for his severe infection along with hypotension and signs of sepsis.  ED ECG REPORT  I personally interpreted this EKG   Date: 05/04/2012   Rate: 107  Rhythm: sinus tachycardia  QRS Axis: normal  Intervals: normal  ST/T Wave abnormalities: normal  Conduction Disutrbances:none  Narrative Interpretation:   Old EKG Reviewed: Compared with 03/17/2012, no significant changes   CRITICAL CARE Performed by: Vida Roller   Total critical care time: 30  Critical care time was exclusive of separately billable procedures and treating other patients.  Critical care was necessary to treat or prevent imminent or life-threatening deterioration.  Critical care was time spent personally by me on the following activities: development of treatment plan with patient and/or surrogate as well as nursing, discussions with consultants, evaluation of patient's response to treatment, examination of patient, obtaining history from patient or surrogate, ordering and performing treatments and interventions, ordering and review of laboratory studies, ordering  and review of radiographic studies, pulse oximetry and re-evaluation of patient's condition.  Diagnosis  #1 sepsis  #2 infected sacral decubitus  #3 anemia  #4 hyponatremia   Medical screening examination/treatment/procedure(s) were conducted as a shared visit with non-physician practitioner(s) and myself.  I personally evaluated the patient during the encounter    Vida Roller, MD 05/04/12 4098  Vida Roller, MD 05/04/12 [phone]
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
## 2012-05-04 NOTE — ED Notes (Signed)
Pt returned from MRI and placed back on monitor. Pt taken on oxygen, able to maintain O2 sats at 99% on room air, pt denies SOB.

## 2012-05-04 NOTE — ED Notes (Signed)
The patient presents complaining of chills, congestion, and nausea since 04/26/12.  He states that he was released from [facility] on 04/26/12 with antibiotics after being diagnosed with bed sores and a UTI.
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
## 2012-05-04 NOTE — ED Notes (Signed)
Per the PA, it is okay to utilize the patient's picc line.
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
## 2012-05-04 NOTE — Progress Notes (Signed)
RT Note: Placed pt on Auto CPAP with full face mask and 2L 02 bleed in. Pt tolerating well. RT and RN will continue to monitor.

## 2012-05-04 NOTE — ED Notes (Signed)
Attempted to give report to RN on 3300, RN unavailable at moment. Will call back later.

## 2012-05-04 NOTE — ED Notes (Signed)
Per Dr. Hyacinth Meeker, no blood cultures are needed.
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
## 2012-05-05 DIAGNOSIS — I10 Essential (primary) hypertension: Secondary | ICD-10-CM

## 2012-05-05 DIAGNOSIS — D649 Anemia, unspecified: Secondary | ICD-10-CM

## 2012-05-05 DIAGNOSIS — N390 Urinary tract infection, site not specified: Secondary | ICD-10-CM

## 2012-05-05 MED ORDER — SODIUM CHLORIDE 0.9 % IJ SOLN
INTRAMUSCULAR | Status: AC
  Administered 2012-05-05: 10 mL
  Filled 2012-05-05: qty 10

## 2012-05-05 NOTE — Progress Notes (Signed)
[facility]             Progress Note   Subjective: No complaints.  Objective: Vital signs in last 24 hours: Temp:  [97.9 F (36.6 C)-100.4 F (38 C)] 99.7 F (37.6 C) (12/21 1548) Pulse Rate:  [89-107] 102  (12/21 1545) Resp:  [17-34] 24  (12/21 1545) BP: (94-137)/(41-75) 123/64 mmHg (12/21 1545) SpO2:  [95 %-99 %] 97 % (12/21 1545) Weight change: -1.799 kg (-3 lb 15.5 oz) Last BM Date: 05/05/12  Intake/Output from previous day: 12/20 0701 - 12/21 0700 In: 2540 [P.O.:440; I.V.:1400; IV Piggyback:700] Out: 2902 [Urine:2900; Stool:2] Total I/O In: 2840 [P.O.:1440; I.V.:700; IV Piggyback:700] Out: 2775 [Urine:2775]   Physical Exam: General: Alert, awake, oriented x3, in no acute distress. HEENT: No bruits, no goiter. Heart: Regular rate and rhythm, without murmurs, rubs, gallops. Lungs: Clear to auscultation bilaterally. Abdomen: Soft, nontender, nondistended, positive bowel sounds. Extremities: 1-2+ edema bilaterally. Neuro: quadriplegic    Lab Results: Basic Metabolic Panel:  [facility] 05/04/12 0436  NA 131*  K 3.8  CL 98  CO2 22  GLUCOSE 115*  BUN 17  CREATININE 0.41*  CALCIUM 9.1  MG --  PHOS --   CBC:  Basename 05/04/12 0436  WBC 17.9*  NEUTROABS 12.5*  HGB 7.2*  HCT 22.8*  MCV 72.2*  PLT 468*   Urinalysis:  Basename 05/04/12 0441  COLORURINE YELLOW  LABSPEC 1.005  PHURINE 6.5  GLUCOSEU NEGATIVE  HGBUR SMALL*  BILIRUBINUR NEGATIVE  KETONESUR NEGATIVE  PROTEINUR NEGATIVE  UROBILINOGEN 0.2  NITRITE POSITIVE*  LEUKOCYTESUR LARGE*    Recent Results (from the past 240 hour(s))  URINE CULTURE     Status: Normal (Preliminary result)   Collection Time   05/04/12  4:41 AM      Component Value Range Status Comment   Specimen Description URINE, CATHETERIZED   Final    Special Requests CX ADDED AT 0505 ON [id number]   Final    Culture  Setup Time 05/04/2012 05:29   Final    Colony Count >=100,000 COLONIES/ML   Final    Culture      Final    Value: GRAM NEGATIVE RODS     YEAST   Report Status PENDING   Incomplete   WOUND CULTURE     Status: Normal (Preliminary result)   Collection Time   05/04/12  5:47 AM      Component Value Range Status Comment   Specimen Description WOUND BUTTOCK   Final    Special Requests Normal   Final    Gram Stain     Final    Value: MODERATE WBC PRESENT,BOTH PMN AND MONONUCLEAR     RARE SQUAMOUS EPITHELIAL CELLS PRESENT     FEW GRAM NEGATIVE RODS     RARE GRAM POSITIVE COCCI     IN PAIRS   Culture Culture reincubated for better growth   Final    Report Status PENDING   Incomplete   CULTURE, BLOOD (ROUTINE X 2)     Status: Normal (Preliminary result)   Collection Time   05/04/12  9:50 AM      Component Value Range Status Comment   Specimen Description BLOOD HAND LEFT   Final    Special Requests BOTTLES DRAWN AEROBIC ONLY 10CC   Final    Culture  Setup Time 05/04/2012 20:22   Final    Culture     Final    Value:        BLOOD CULTURE RECEIVED NO  GROWTH TO DATE CULTURE WILL BE HELD FOR 5 DAYS BEFORE ISSUING A FINAL NEGATIVE REPORT   Report Status PENDING   Incomplete   CULTURE, BLOOD (ROUTINE X 2)     Status: Normal (Preliminary result)   Collection Time   05/04/12 10:00 AM      Component Value Range Status Comment   Specimen Description BLOOD HAND LEFT   Final    Special Requests     Final    Value: BOTTLES DRAWN AEROBIC AND ANAEROBIC BLUE 10CC  RED 6CC   Culture  Setup Time 05/04/2012 15:27   Final    Culture     Final    Value:        BLOOD CULTURE RECEIVED NO GROWTH TO DATE CULTURE WILL BE HELD FOR 5 DAYS BEFORE ISSUING A FINAL NEGATIVE REPORT   Report Status PENDING   Incomplete   MRSA PCR SCREENING     Status: Normal   Collection Time   05/04/12  5:00 PM      Component Value Range Status Comment   MRSA by PCR NEGATIVE  NEGATIVE Final     Studies/Results: Mr Pelvis W Wo Contrast  05/04/2012  *RADIOLOGY REPORT*  Clinical Data: Sacral decubitus ulcer with  foul-smelling discharge. History of osteomyelitis treated with antibiotics.  MRI PELVIS WITHOUT AND WITH CONTRAST  Technique:  Multiplanar multisequence MR imaging of the pelvis was performed both before and after administration of intravenous contrast.  Contrast: 20mL MULTIHANCE GADOBENATE DIMEGLUMINE 529 MG/ML IV SOLN  Comparison: Pelvic CTs 02/29/2012 and 01/18/2012.  Findings: There is chronic dislocation of the right hip and chronic extensive fragmentation of the right femoral head and neck.  There is extensive heterotopic ossification surrounding the dislocated femoral head.  There is a deep decubitus ulcer posterior to the dislocated proximal femur which extends to the bone as on the prior CT. There are multiple peripherally enhancing fluid collections associated with the dislocated femoral head and lateral to the proximal femur.  The latter are incompletely imaged.  There is diffuse surrounding synovial and muscular enhancement. Edema and enhancement track along the right iliacus muscle.  There is irregular sclerosis, edema and enhancement within the proximal right femur.  Similar involvement is present within the right iliac bone and acetabulum.  There is chronic deformity of the right ischium.  The sacroiliac joints are chronically fused without adjacent marrow edema.  There is chronic deformity of the proximal left femur with surrounding heterotopic ossification.  The left femur demonstrates no suspicious marrow signal or enhancement.  The patient has a descending colostomy and indwelling bladder catheter.  Multiple prominent pelvic and inguinal lymph nodes are present, likely reactive and similar to prior CT.  No intrapelvic fluid collections are identified.  IMPRESSION:  1.  Chronic dislocation of the right hip and fragmentation of the proximal right femur are similar to prior CT.  Comparison between modalities is limited. 2.  Sclerosis, edema and enhancement within the proximal femur and adjacent  acetabulum are consistent with chronic osteomyelitis. Superimposed acute osteomyelitis is likely. 3.  Extensive inflammatory changes surrounding the right hip with large irregular peripherally enhancing fluid collections, synovial enhancement and muscular enhancement adjacent to a large decubitus ulcer.  These findings are consistent with chronic soft tissue infection with abscess formation.   Original Report Authenticated By: Carey Bullocks, M.D.    Dg Chest Portable 1 View  05/04/2012  *RADIOLOGY REPORT*  Clinical Data: Congestion, chills  PORTABLE CHEST - 1 VIEW  Comparison: 04/22/2012, 10/26/2010 CT  Findings: Prominent cardiomediastinal contours. Multiple small calcific densities projecting over the right upper lung and scapula, likely correspond to post-traumatic changes described on prior CT. Mild dependent atelectasis.  No pleural effusion or pneumothorax.  Prior left rib fractures again noted.  Mild rightward thoracic spine curvature may be exaggerated by positioning. Right PICC tip projects over the proximal SVC.  IMPRESSION: Chronic changes as above.  Cardiomediastinal prominence is similar to prior.   Original Report Authenticated By: Jearld Lesch, M.D.     Medications: Scheduled Meds:   . baclofen  20 mg Oral QID  . docusate sodium  100 mg Oral BID  . enoxaparin (LOVENOX) injection  40 mg Subcutaneous Q24H  . famotidine  20 mg Oral BID  . ferrous sulfate  325 mg Oral TID WC  . furosemide  20 mg Oral Daily  . imipenem-cilastatin  500 mg Intravenous Q6H  . metoCLOPramide  10 mg Oral BID WC  . multivitamin with minerals  1 tablet Oral Daily  . potassium chloride SA  20 mEq Oral Daily  . sodium chloride  3 mL Intravenous Q12H  . vancomycin  1,500 mg Intravenous Q12H  . vitamin C  500 mg Oral BID  . zinc sulfate  220 mg Oral BID   Continuous Infusions:   . sodium chloride 100 mL/hr at 05/04/12 1012   PRN Meds:.acetaminophen, acetaminophen,  albuterol  Assessment/Plan:  Principal Problem:  *Sepsis Active Problems:  OSA (obstructive sleep apnea)  Obesity  Quadriplegia  Seizure disorder  HTN (hypertension)  Sacral decubitus ulcer, stage IV  Recurrent UTI   Sepsis -Presumed 2/2 decubitus ulcer. -Improved. -Will transfer to floor.  Stage IV Sacral Decubitus Ulcer -Continue IV vanc/imipenem. -Ortho has seen. -They are trying to get plastic surgery to see him. -They have recommended LTAC for aggressive wound care. -Will ask CM to assist with LTAC referral. -Appreciate ID input.  Recurrent UTIs -Cx growing >100000 GNR. -Has a h/o multidrug resistant UTIs. -Await final cx data.    Time spent coordinating care: 35 minutes   LOS: 1 day   [facility] Pager: [phone] 05/05/2012, 5:58 PM

## 2012-05-06 DIAGNOSIS — E871 Hypo-osmolality and hyponatremia: Secondary | ICD-10-CM

## 2012-05-06 LAB — CBC
HCT: 21.1 % — ABNORMAL LOW (ref 39.0–52.0)
MCH: 22.3 pg — ABNORMAL LOW (ref 26.0–34.0)
MCHC: 30.3 g/dL (ref 30.0–36.0)
MCV: 73.5 fL — ABNORMAL LOW (ref 78.0–100.0)
RDW: 18 % — ABNORMAL HIGH (ref 11.5–15.5)

## 2012-05-06 LAB — BASIC METABOLIC PANEL
BUN: 7 mg/dL (ref 6–23)
CO2: 22 mEq/L (ref 19–32)
Chloride: 102 mEq/L (ref 96–112)
Creatinine, Ser: 0.38 mg/dL — ABNORMAL LOW (ref 0.50–1.35)
Glucose, Bld: 104 mg/dL — ABNORMAL HIGH (ref 70–99)

## 2012-05-06 LAB — URINE CULTURE

## 2012-05-06 MED ORDER — ALTEPLASE 2 MG IJ SOLR
2.0000 mg | Freq: Once | INTRAMUSCULAR | Status: AC
  Administered 2012-05-06: 2 mg
  Filled 2012-05-06: qty 2

## 2012-05-06 NOTE — Progress Notes (Signed)
[facility]                                                                                Patient Demographics  [name] [name], is a 46 y.o. male  UJW:[id number]  FAO:[id number]  DOB - 1966-01-26  Admit date - 05/04/2012  Admitting Physician Eddie North, MD  Outpatient Primary MD for the patient is [name], MD  LOS - 2   Chief Complaint  Patient presents with  . Chills  . Nasal Congestion  . Nausea  . Wound Infection    to buttocks (per EMS)        Assessment & Plan   Brief summary.  46 year old quadriplegic African American male with history of chronic sacral diabetes ulcer stage IV and left knee wound, with diverting colostomy and chronic suprapubic catheter, who has had history of possible eye to send her current UTIs and soft tissue infection presents to the hospital with sepsis most likely due to infection around the sacral decubitus ulcer with Roxy Manns mellitus and possible abscess.  Asian follows with plastic surgeon Dr. Helyn Numbers, he was seen here by Dr. Rennis Chris orthopedic surgery who differed the case to Dr. Helyn Numbers, have called plastic surgery office number, there is no answering machine all reply, I will call again on Monday. Patient is being seen by ID is on broad-spectrum antibiotic continue to monitor cultures. Currently nontoxic appearing.     1. Sepsis most likely due to infection from the chronic stage IV sacral decubitus ulcer and adjacent Osteomellitus with questionable abscess formation in the soft tissue. - Blood cultures are negative to date, wound cultures growing staph aureus along with some gram-negative rods, will monitor culture and sensitivity, appreciate ID input, continue vancomycin along with carbepanem, discussed the case with orthopedic surgeon Dr. Rennis Chris who recommended that case be discussed with Dr. Helyn Numbers plastic surgeon as it was too complex for his capabilities, I called Dr. Helyn Numbers on [phone] no response, will  call again Monday. Continue local wound care for his ulcers.     2. Quadriplegia - with colostomy, suprapubic catheter which is changed the 2 months, urologist Dr. Isabel Caprice. No acute issues continue supportive care.     3. History of recurrent UTIs - UTI versus colonization, monitor cultures, for now antibiotics for #1 above should suffice.     4. Hypertension - blood pressure stable continue present regimen of low dose Lasix only.     5. History of seizures - no acute issues we'll monitor clinically. He is not on any anti-seizure medications at home.     6. Low potassium - will be replaced and rechecked.    Code Status: Full  Family Communication: Discussed with the patient  Disposition Plan: LTAC rehabilitation   Procedures MRI   Consults  ID, orthopedics case discussed with Dr. Rennis Chris @ [phone] on 05/06/2012 he recommended to discuss it with Dr. Helyn Numbers, called plastic surgeon Dr. Helyn Numbers on 05/06/2012 @ [phone] no reply will call again Monday   DVT Prophylaxis  Lovenox   Lab Results  Component Value Date   PLT 449* 05/06/2012    Medications  Scheduled Meds:   . alteplase  2 mg Intracatheter  Once  . baclofen  20 mg Oral QID  . docusate sodium  100 mg Oral BID  . enoxaparin (LOVENOX) injection  40 mg Subcutaneous Q24H  . famotidine  20 mg Oral BID  . ferrous sulfate  325 mg Oral TID WC  . furosemide  20 mg Oral Daily  . imipenem-cilastatin  500 mg Intravenous Q6H  . metoCLOPramide  10 mg Oral BID WC  . multivitamin with minerals  1 tablet Oral Daily  . potassium chloride SA  20 mEq Oral Daily  . sodium chloride  3 mL Intravenous Q12H  . vancomycin  1,500 mg Intravenous Q12H  . vitamin C  500 mg Oral BID  . zinc sulfate  220 mg Oral BID   Continuous Infusions:   . sodium chloride 100 mL/hr at 05/06/12 0841   PRN Meds:.acetaminophen, acetaminophen, albuterol  Antibiotics    Anti-infectives     Start     Dose/Rate Route Frequency  Ordered Stop   05/04/12 2200   vancomycin (VANCOCIN) 1,500 mg in sodium chloride 0.9 % 500 mL IVPB        1,500 mg 250 mL/hr over 120 Minutes Intravenous Every 12 hours 05/04/12 0909     05/04/12 1600   imipenem-cilastatin (PRIMAXIN) 500 mg in sodium chloride 0.9 % 100 mL IVPB        500 mg 200 mL/hr over 30 Minutes Intravenous Every 6 hours 05/04/12 0909     05/04/12 0915   vancomycin (VANCOCIN) 500 mg in sodium chloride 0.9 % 100 mL IVPB        500 mg 100 mL/hr over 60 Minutes Intravenous To Emergency Dept 05/04/12 0907 05/04/12 1126   05/04/12 0915   imipenem-cilastatin (PRIMAXIN) 500 mg in sodium chloride 0.9 % 100 mL IVPB        500 mg 200 mL/hr over 30 Minutes Intravenous To Emergency Dept 05/04/12 0907 05/04/12 1346   05/04/12 0615   vancomycin (VANCOCIN) IVPB 1000 mg/200 mL premix        1,000 mg 200 mL/hr over 60 Minutes Intravenous  Once 05/04/12 0611 05/04/12 0821   05/04/12 0615  piperacillin-tazobactam (ZOSYN) IVPB 3.375 g       3.375 g 12.5 mL/hr over 240 Minutes Intravenous  Once 05/04/12 5784 05/04/12 1059           Time Spent in minutes   45   SINGH,PRASHANT K M.D on 05/06/2012 at 8:46 AM  Between 7am to 7pm - Pager - [phone]  After 7pm go to [email or web address] - password [email or web address]  And look for the night coverage person covering for me after hours  [facility] Office  [phone]    Subjective:   Redmond Whittley today has, No headache, No chest pain, No abdominal pain - No Nausea, No new weakness tingling or numbness, No Cough - SOB.   Objective:   Filed Vitals:   05/05/12 1548 05/06/12 0000 05/06/12 0013 05/06/12 0553  BP:  132/83  135/74  Pulse:  111 101 93  Temp: 99.7 F (37.6 C) 100.9 F (38.3 C)  99.9 F (37.7 C)  TempSrc: Oral Oral  Oral  Resp:  24 25 20   Height:      Weight:      SpO2:   96% 96%    Wt Readings from Last 3 Encounters:  05/04/12 111.6 kg (246 lb 0.5 oz)  04/22/12 113 kg (249 lb 1.9 oz)  01/24/12 115.5 kg  (254 lb 10.1 oz)     Intake/Output  Summary (Last 24 hours) at 05/06/12 0846 Last data filed at 05/06/12 0600  Gross per 24 hour  Intake   5380 ml  Output   5775 ml  Net   -395 ml    Exam Awake Alert, Oriented X 3, No new F.N deficits, Normal affect [place].AT,PERRAL Supple Neck,No JVD, No cervical lymphadenopathy appriciated.  Symmetrical Chest wall movement, Good air movement bilaterally, CTAB RRR,No Gallops,Rubs or new Murmurs, No Parasternal Heave +ve B.Sounds, Abd Soft, Non tender, No organomegaly appriciated, No rebound - guarding or rigidity. No Cyanosis, Clubbing or edema, No new Rash or bruise, large stage IV sacral inhibitors ulcer, also left knee wound, has suprapubic catheter, colostomy with stool. R Arm PICC.   Data Review   Micro Results Recent Results (from the past 240 hour(s))  URINE CULTURE     Status: Normal (Preliminary result)   Collection Time   05/04/12  4:41 AM      Component Value Range Status Comment   Specimen Description URINE, CATHETERIZED   Final    Special Requests CX ADDED AT 0505 ON [id number]   Final    Culture  Setup Time 05/04/2012 05:29   Final    Colony Count >=100,000 COLONIES/ML   Final    Culture     Final    Value: GRAM NEGATIVE RODS     YEAST   Report Status PENDING   Incomplete   WOUND CULTURE     Status: Normal (Preliminary result)   Collection Time   05/04/12  5:47 AM      Component Value Range Status Comment   Specimen Description WOUND BUTTOCK   Final    Special Requests Normal   Final    Gram Stain     Final    Value: MODERATE WBC PRESENT,BOTH PMN AND MONONUCLEAR     RARE SQUAMOUS EPITHELIAL CELLS PRESENT     FEW GRAM NEGATIVE RODS     RARE GRAM POSITIVE COCCI     IN PAIRS   Culture     Final    Value: FEW STAPHYLOCOCCUS AUREUS     Note: RIFAMPIN AND GENTAMICIN SHOULD NOT BE USED AS SINGLE DRUGS FOR TREATMENT OF STAPH INFECTIONS.   Report Status PENDING   Incomplete   CULTURE, BLOOD (ROUTINE X 2)     Status: Normal  (Preliminary result)   Collection Time   05/04/12  9:50 AM      Component Value Range Status Comment   Specimen Description BLOOD HAND LEFT   Final    Special Requests BOTTLES DRAWN AEROBIC ONLY 10CC   Final    Culture  Setup Time 05/04/2012 20:22   Final    Culture     Final    Value:        BLOOD CULTURE RECEIVED NO GROWTH TO DATE CULTURE WILL BE HELD FOR 5 DAYS BEFORE ISSUING A FINAL NEGATIVE REPORT   Report Status PENDING   Incomplete   CULTURE, BLOOD (ROUTINE X 2)     Status: Normal (Preliminary result)   Collection Time   05/04/12 10:00 AM      Component Value Range Status Comment   Specimen Description BLOOD HAND LEFT   Final    Special Requests     Final    Value: BOTTLES DRAWN AEROBIC AND ANAEROBIC BLUE 10CC  RED 6CC   Culture  Setup Time 05/04/2012 15:27   Final    Culture     Final    Value:  BLOOD CULTURE RECEIVED NO GROWTH TO DATE CULTURE WILL BE HELD FOR 5 DAYS BEFORE ISSUING A FINAL NEGATIVE REPORT   Report Status PENDING   Incomplete   MRSA PCR SCREENING     Status: Normal   Collection Time   05/04/12  5:00 PM      Component Value Range Status Comment   MRSA by PCR NEGATIVE  NEGATIVE Final     Radiology Reports Dg Chest 2 View  04/22/2012  *RADIOLOGY REPORT*  Clinical Data: Chest pain and weakness.  CHEST - 2 VIEW  Comparison: 03/17/2012 and chest CT 10/26/2010  Findings: Two views of the chest were obtained.  Again noted are old left rib fractures.  In addition, there are chronic densities in the right upper chest related to the right scapula and posterior ribs based on previous CT.  Limited evaluation of the lung bases due to patient positioning and body habitus.  Heart and mediastinum appear grossly stable.  Postsurgical changes in the lower cervical spine.  IMPRESSION: Limited examination without acute findings.  There are chronic densities in the right upper chest as described. Evidence of old left rib fractures.   Original Report Authenticated By: Richarda Overlie,  M.D.    Mr Pelvis W Wo Contrast  05/04/2012  *RADIOLOGY REPORT*  Clinical Data: Sacral decubitus ulcer with foul-smelling discharge. History of osteomyelitis treated with antibiotics.  MRI PELVIS WITHOUT AND WITH CONTRAST  Technique:  Multiplanar multisequence MR imaging of the pelvis was performed both before and after administration of intravenous contrast.  Contrast: 20mL MULTIHANCE GADOBENATE DIMEGLUMINE 529 MG/ML IV SOLN  Comparison: Pelvic CTs 02/29/2012 and 01/18/2012.  Findings: There is chronic dislocation of the right hip and chronic extensive fragmentation of the right femoral head and neck.  There is extensive heterotopic ossification surrounding the dislocated femoral head.  There is a deep decubitus ulcer posterior to the dislocated proximal femur which extends to the bone as on the prior CT. There are multiple peripherally enhancing fluid collections associated with the dislocated femoral head and lateral to the proximal femur.  The latter are incompletely imaged.  There is diffuse surrounding synovial and muscular enhancement. Edema and enhancement track along the right iliacus muscle.  There is irregular sclerosis, edema and enhancement within the proximal right femur.  Similar involvement is present within the right iliac bone and acetabulum.  There is chronic deformity of the right ischium.  The sacroiliac joints are chronically fused without adjacent marrow edema.  There is chronic deformity of the proximal left femur with surrounding heterotopic ossification.  The left femur demonstrates no suspicious marrow signal or enhancement.  The patient has a descending colostomy and indwelling bladder catheter.  Multiple prominent pelvic and inguinal lymph nodes are present, likely reactive and similar to prior CT.  No intrapelvic fluid collections are identified.  IMPRESSION:  1.  Chronic dislocation of the right hip and fragmentation of the proximal right femur are similar to prior CT.  Comparison  between modalities is limited. 2.  Sclerosis, edema and enhancement within the proximal femur and adjacent acetabulum are consistent with chronic osteomyelitis. Superimposed acute osteomyelitis is likely. 3.  Extensive inflammatory changes surrounding the right hip with large irregular peripherally enhancing fluid collections, synovial enhancement and muscular enhancement adjacent to a large decubitus ulcer.  These findings are consistent with chronic soft tissue infection with abscess formation.   Original Report Authenticated By: Carey Bullocks, M.D.    Ir Fluoro Guide Cv Line Right  04/22/2012  *RADIOLOGY REPORT*  Clinical  Data: 46 year old with sepsis.  PICC LINE PLACEMENT WITH ULTRASOUND AND FLUOROSCOPIC  GUIDANCE  Fluoroscopy Time: 1.1 minutes.  The right arm was prepped with chlorhexidine, draped in the usual sterile fashion using maximum barrier technique (cap and mask, sterile gown, sterile gloves, large sterile sheet, hand hygiene and cutaneous antisepsis) and infiltrated locally with 1% Lidocaine.  Ultrasound demonstrated patency of the right brachial vein, and this was documented with an image.  Under real-time ultrasound guidance, this vein was accessed with a 21 gauge micropuncture needle and image documentation was performed.  The needle was exchanged over a guidewire for a peel-away sheath through which a five [place] dual lumen PICC trimmed to 52 cm was advanced, positioned with its tip at the lower SVC/right atrial junction. Fluoroscopy during the procedure and fluoro spot radiograph confirms appropriate catheter position.  The catheter was flushed, secured to the skin with Prolene sutures, and covered with a sterile dressing.  Complications:  None  IMPRESSION: Successful right arm PICC line placement with ultrasound and fluoroscopic guidance.  The catheter is ready for use.   Original Report Authenticated By: Richarda Overlie, M.D.    Ir US Guide Vasc Access Right  04/22/2012  *RADIOLOGY REPORT*   Clinical Data: 46 year old with sepsis.  PICC LINE PLACEMENT WITH ULTRASOUND AND FLUOROSCOPIC  GUIDANCE  Fluoroscopy Time: 1.1 minutes.  The right arm was prepped with chlorhexidine, draped in the usual sterile fashion using maximum barrier technique (cap and mask, sterile gown, sterile gloves, large sterile sheet, hand hygiene and cutaneous antisepsis) and infiltrated locally with 1% Lidocaine.  Ultrasound demonstrated patency of the right brachial vein, and this was documented with an image.  Under real-time ultrasound guidance, this vein was accessed with a 21 gauge micropuncture needle and image documentation was performed.  The needle was exchanged over a guidewire for a peel-away sheath through which a five [place] dual lumen PICC trimmed to 52 cm was advanced, positioned with its tip at the lower SVC/right atrial junction. Fluoroscopy during the procedure and fluoro spot radiograph confirms appropriate catheter position.  The catheter was flushed, secured to the skin with Prolene sutures, and covered with a sterile dressing.  Complications:  None  IMPRESSION: Successful right arm PICC line placement with ultrasound and fluoroscopic guidance.  The catheter is ready for use.   Original Report Authenticated By: Richarda Overlie, M.D.    Dg Chest Portable 1 View  05/04/2012  *RADIOLOGY REPORT*  Clinical Data: Congestion, chills  PORTABLE CHEST - 1 VIEW  Comparison: 04/22/2012, 10/26/2010 CT  Findings: Prominent cardiomediastinal contours. Multiple small calcific densities projecting over the right upper lung and scapula, likely correspond to post-traumatic changes described on prior CT. Mild dependent atelectasis.  No pleural effusion or pneumothorax.  Prior left rib fractures again noted.  Mild rightward thoracic spine curvature may be exaggerated by positioning. Right PICC tip projects over the proximal SVC.  IMPRESSION: Chronic changes as above.  Cardiomediastinal prominence is similar to prior.   Original Report  Authenticated By: Jearld Lesch, M.D.     CBC  Lab 05/06/12 0655 05/04/12 0436  WBC 14.6* 17.9*  HGB 6.4* 7.2*  HCT 21.1* 22.8*  PLT 449* 468*  MCV 73.5* 72.2*  MCH 22.3* 22.8*  MCHC 30.3 31.6  RDW 18.0* 17.6*  LYMPHSABS -- 2.7  MONOABS -- 2.3*  EOSABS -- 0.4  BASOSABS -- 0.0  BANDABS -- --    Chemistries   Lab 05/06/12 0655 05/04/12 0436  NA 135 131*  K 3.1* 3.8  CL 102 98  CO2 22 22  GLUCOSE 104* 115*  BUN 7 17  CREATININE 0.38* 0.41*  CALCIUM 8.5 9.1  MG -- --  AST -- --  ALT -- --  ALKPHOS -- --  BILITOT -- --   ------------------------------------------------------------------------------------------------------------------ estimated creatinine clearance is 150.4 ml/min (by C-G formula based on Cr of 0.38). ------------------------------------------------------------------------------------------------------------------ No results found for this basename: HGBA1C:2 in the last 72 hours ------------------------------------------------------------------------------------------------------------------ No results found for this basename: CHOL:2,HDL:2,LDLCALC:2,TRIG:2,CHOLHDL:2,LDLDIRECT:2 in the last 72 hours ------------------------------------------------------------------------------------------------------------------ No results found for this basename: TSH,T4TOTAL,FREET3,T3FREE,THYROIDAB in the last 72 hours ------------------------------------------------------------------------------------------------------------------ No results found for this basename: VITAMINB12:2,FOLATE:2,FERRITIN:2,TIBC:2,IRON:2,RETICCTPCT:2 in the last 72 hours  Coagulation profile No results found for this basename: INR:5,PROTIME:5 in the last 168 hours  No results found for this basename: DDIMER:2 in the last 72 hours  Cardiac Enzymes No results found for this basename: CK:3,CKMB:3,TROPONINI:3,MYOGLOBIN:3 in the last 168  hours ------------------------------------------------------------------------------------------------------------------ No components found with this basename: POCBNP:3

## 2012-05-06 NOTE — Consult Note (Addendum)
WOC consult Note Reason for Consult: Patient well known to our service, last seen on 04/25/12 Wound type:Chronic Stage IV Pressure Ulcers, worsening Pressure Ulcer POA: Yes Dressing procedure/placement/frequency:Noted are Dr. Doristine Church consults to both Orthopedics (Dr. Rennis Chris) and to Plastics (Dr. Kelly Splinter).   I will defer to Dr. Kelly Splinter, but pending her assessment and orders I will implement orders previously used to the care of this patient's wounds  (e.g., low air loss sleep surface, hydrotherapy and saline moisture-retentive dressings twice daily) as well as orders to allow nursing staff to provide care for his colostomy. Pouch change schedule and supplies used are listed in the patient orders. WOC team will not follow, but will remain available as needed to this patient and his medical, surgical and nursing teams.  Please re-consult if needed. Thanks, Ladona Mow, MSN, RN, [facility], CWOCN [phone])

## 2012-05-06 NOTE — Progress Notes (Signed)
3300  reported pt. telemetry  d/c'd ,will f/u with MD

## 2012-05-07 ENCOUNTER — Encounter (HOSPITAL_BASED_OUTPATIENT_CLINIC_OR_DEPARTMENT_OTHER): Payer: Medicare Other | Attending: Plastic Surgery

## 2012-05-07 LAB — WOUND CULTURE: Special Requests: NORMAL

## 2012-05-07 LAB — CBC
HCT: 22.2 % — ABNORMAL LOW (ref 39.0–52.0)
MCHC: 30.2 g/dL (ref 30.0–36.0)
RDW: 18 % — ABNORMAL HIGH (ref 11.5–15.5)

## 2012-05-07 LAB — BASIC METABOLIC PANEL
BUN: 7 mg/dL (ref 6–23)
Calcium: 8.6 mg/dL (ref 8.4–10.5)
Chloride: 105 mEq/L (ref 96–112)
Creatinine, Ser: 0.38 mg/dL — ABNORMAL LOW (ref 0.50–1.35)
GFR calc Af Amer: >90 mL/min (ref >90)
GFR calc non Af Amer: >90 mL/min (ref >90)

## 2012-05-07 MED ORDER — ALTEPLASE 100 MG IV SOLR
2.0000 mg | Freq: Once | INTRAVENOUS | Status: AC
  Administered 2012-05-07: 2 mg
  Filled 2012-05-07 (×2): qty 2

## 2012-05-07 MED ORDER — SODIUM CHLORIDE 0.9 % IV SOLN: INTRAVENOUS | Status: DC

## 2012-05-07 MED ORDER — BOOST PLUS PO LIQD
237.0000 mL | Freq: Two times a day (BID) | ORAL | Status: DC
  Administered 2012-05-07 – 2012-05-22 (×15): 237 mL via ORAL
  Filled 2012-05-07 (×34): qty 237

## 2012-05-07 MED ORDER — POTASSIUM CHLORIDE CRYS ER 20 MEQ PO TBCR
40.0000 meq | EXTENDED_RELEASE_TABLET | Freq: Once | ORAL | Status: AC
  Administered 2012-05-07: 40 meq via ORAL
  Filled 2012-05-07 (×2): qty 2

## 2012-05-07 MED ORDER — ALTEPLASE 100 MG IV SOLR
2.0000 mg | Freq: Once | INTRAVENOUS | Status: AC
  Administered 2012-05-07: 2 mg
  Filled 2012-05-07: qty 2

## 2012-05-07 MED ORDER — POTASSIUM CHLORIDE 20 MEQ/15ML (10%) PO LIQD
40.0000 meq | Freq: Once | ORAL | Status: DC
  Filled 2012-05-07: qty 30

## 2012-05-07 MED ORDER — JUVEN PO PACK
1.0000 | PACK | Freq: Two times a day (BID) | ORAL | Status: DC
  Administered 2012-05-07 – 2012-05-22 (×17): 1 via ORAL
  Filled 2012-05-07 (×33): qty 1

## 2012-05-07 NOTE — Progress Notes (Signed)
[facility]                                                                                Patient Demographics  [name] [name], is a 46 y.o. male  GNF:[id number]  QIO:[id number]  DOB - 03/05/1966  Admit date - 05/04/2012  Admitting Physician Eddie North, MD  Outpatient Primary MD for the patient is [name], MD  LOS - 3   Chief Complaint  Patient presents with  . Chills  . Nasal Congestion  . Nausea  . Wound Infection    to buttocks (per EMS)        Assessment & Plan   Brief summary.  46 year old quadriplegic African American male with history of chronic sacral diabetes ulcer stage IV and left knee wound, with diverting colostomy and chronic suprapubic catheter, who has had history of possible eye to send her current UTIs and soft tissue infection presents to the hospital with sepsis most likely due to infection around the sacral decubitus ulcer with Roxy Manns mellitus and possible abscess.  Asian follows with plastic surgeon Dr. Helyn Numbers, he was seen here by Dr. Rennis Chris orthopedic surgery who differed the case to Dr. Helyn Numbers, have discussed the case with Dr. Helyn Numbers who see the patient shortly. Patient is being seen by ID is on broad-spectrum antibiotic continue to monitor cultures. Currently nontoxic appearing.     1. Sepsis most likely due to infection from the chronic stage IV sacral decubitus ulcer and adjacent Osteomellitus with questionable abscess formation in the soft tissue. - Blood cultures are negative to date, wound cultures growing staph aureus along with some gram-negative rods, will monitor culture and sensitivity, appreciate ID input, continue vancomycin along with carbepanem, discussed the case with orthopedic surgeon Dr. Rennis Chris who recommended that case be discussed with Dr. Helyn Numbers plastic surgeon as it was too complex for his capabilities, I called Dr. Helyn Numbers on [phone], she will see the patient shortly, Continue local wound care for  his ulcers.     2. Quadriplegia - with colostomy, suprapubic catheter which is changed the 2 months, urologist Dr. Isabel Caprice. No acute issues continue supportive care.     3. History of recurrent UTIs - UTI versus colonization, noted cultures, antibiotics for #1 above will suffice.     4. Hypertension - blood pressure stable continue present regimen of low dose Lasix only.     5. History of seizures - no acute issues we'll monitor clinically. He is not on any anti-seizure medications at home.     6. Low potassium - will be replaced and rechecked.    Code Status: Full  Family Communication: Discussed with the patient  Disposition Plan: LTAC rehabilitation   Procedures MRI   Consults  ID, orthopedics case discussed with Dr. Rennis Chris @ [phone] on 05/06/2012 he recommended to discuss it with Dr. Helyn Numbers, called plastic surgeon Dr. Helyn Numbers on 05/06/2012 @ [phone] no reply will call again Monday   DVT Prophylaxis  Lovenox   Lab Results  Component Value Date   PLT 503* 05/07/2012    Medications  Scheduled Meds:    . baclofen  20 mg Oral QID  . docusate sodium  100 mg Oral BID  .  enoxaparin (LOVENOX) injection  40 mg Subcutaneous Q24H  . famotidine  20 mg Oral BID  . ferrous sulfate  325 mg Oral TID WC  . furosemide  20 mg Oral Daily  . imipenem-cilastatin  500 mg Intravenous Q6H  . lactose free nutrition  237 mL Oral BID BM  . metoCLOPramide  10 mg Oral BID WC  . multivitamin with minerals  1 tablet Oral Daily  . nutrition supplement  1 packet Oral BID BM  . potassium chloride  40 mEq Oral Once  . potassium chloride SA  20 mEq Oral Daily  . vancomycin  1,500 mg Intravenous Q12H  . vitamin C  500 mg Oral BID  . zinc sulfate  220 mg Oral BID   Continuous Infusions:    . sodium chloride     PRN Meds:.acetaminophen, acetaminophen, albuterol  Antibiotics    Anti-infectives     Start     Dose/Rate Route Frequency Ordered Stop   05/04/12 2200    vancomycin (VANCOCIN) 1,500 mg in sodium chloride 0.9 % 500 mL IVPB        1,500 mg 250 mL/hr over 120 Minutes Intravenous Every 12 hours 05/04/12 0909     05/04/12 1600   imipenem-cilastatin (PRIMAXIN) 500 mg in sodium chloride 0.9 % 100 mL IVPB        500 mg 200 mL/hr over 30 Minutes Intravenous Every 6 hours 05/04/12 0909     05/04/12 0915   vancomycin (VANCOCIN) 500 mg in sodium chloride 0.9 % 100 mL IVPB        500 mg 100 mL/hr over 60 Minutes Intravenous To Emergency Dept 05/04/12 0907 05/04/12 1126   05/04/12 0915   imipenem-cilastatin (PRIMAXIN) 500 mg in sodium chloride 0.9 % 100 mL IVPB        500 mg 200 mL/hr over 30 Minutes Intravenous To Emergency Dept 05/04/12 0907 05/04/12 1346   05/04/12 0615   vancomycin (VANCOCIN) IVPB 1000 mg/200 mL premix        1,000 mg 200 mL/hr over 60 Minutes Intravenous  Once 05/04/12 0611 05/04/12 0821   05/04/12 0615   piperacillin-tazobactam (ZOSYN) IVPB 3.375 g        3.375 g 12.5 mL/hr over 240 Minutes Intravenous  Once 05/04/12 1610 05/04/12 1059           Time Spent in minutes   45   SINGH,PRASHANT K M.D on 05/07/2012 at 10:04 AM  Between 7am to 7pm - Pager - [phone]  After 7pm go to [email or web address] - password [email or web address]  And look for the night coverage person covering for me after hours  [facility] Office  [phone]    Subjective:   [name] [name] today has, No headache, No chest pain, No abdominal pain - No Nausea, No new weakness tingling or numbness, No Cough - SOB.   Objective:   Filed Vitals:   05/06/12 1830 05/06/12 2217 05/07/12 0027 05/07/12 0626  BP: 154/82 126/39  119/91  Pulse: 78 98 98 91  Temp: 98.9 F (37.2 C) 99.4 F (37.4 C)  98.9 F (37.2 C)  TempSrc: Oral Oral  Oral  Resp: 20 20 23 20   Height:      Weight:      SpO2: 100% 99% 98% 97%    Wt Readings from Last 3 Encounters:  05/04/12 111.6 kg (246 lb 0.5 oz)  04/22/12 113 kg (249 lb 1.9 oz)  01/24/12 115.5 kg (254 lb 10.1  oz)  Intake/Output Summary (Last 24 hours) at 05/07/12 1004 Last data filed at 05/07/12 0600  Gross per 24 hour  Intake   2003 ml  Output   4450 ml  Net  -2447 ml    Exam Awake Alert, Oriented X 3, No new F.N deficits, Normal affect [place].AT,PERRAL Supple Neck,No JVD, No cervical lymphadenopathy appriciated.  Symmetrical Chest wall movement, Good air movement bilaterally, CTAB RRR,No Gallops,Rubs or new Murmurs, No Parasternal Heave +ve B.Sounds, Abd Soft, Non tender, No organomegaly appriciated, No rebound - guarding or rigidity. No Cyanosis, Clubbing or edema, No new Rash or bruise, large stage IV sacral inhibitors ulcer, also left knee wound, has suprapubic catheter, colostomy with stool. R Arm PICC.   Data Review   Micro Results Recent Results (from the past 240 hour(s))  URINE CULTURE     Status: Normal   Collection Time   05/04/12  4:41 AM      Component Value Range Status Comment   Specimen Description URINE, CATHETERIZED   Final    Special Requests CX ADDED AT 0505 ON [id number]   Final    Culture  Setup Time 05/04/2012 05:29   Final    Colony Count >=100,000 COLONIES/ML   Final    Culture     Final    Value: PSEUDOMONAS AERUGINOSA     YEAST   Report Status 05/06/2012 FINAL   Final    Organism ID, Bacteria PSEUDOMONAS AERUGINOSA   Final   WOUND CULTURE     Status: Normal (Preliminary result)   Collection Time   05/04/12  5:47 AM      Component Value Range Status Comment   Specimen Description WOUND BUTTOCK   Final    Special Requests Normal   Final    Gram Stain     Final    Value: MODERATE WBC PRESENT,BOTH PMN AND MONONUCLEAR     RARE SQUAMOUS EPITHELIAL CELLS PRESENT     FEW GRAM NEGATIVE RODS     RARE GRAM POSITIVE COCCI     IN PAIRS   Culture     Final    Value: FEW STAPHYLOCOCCUS AUREUS     Note: RIFAMPIN AND GENTAMICIN SHOULD NOT BE USED AS SINGLE DRUGS FOR TREATMENT OF STAPH INFECTIONS.   Report Status PENDING   Incomplete   CULTURE, BLOOD (ROUTINE X  2)     Status: Normal (Preliminary result)   Collection Time   05/04/12  9:50 AM      Component Value Range Status Comment   Specimen Description BLOOD HAND LEFT   Final    Special Requests BOTTLES DRAWN AEROBIC ONLY 10CC   Final    Culture  Setup Time 05/04/2012 20:22   Final    Culture     Final    Value:        BLOOD CULTURE RECEIVED NO GROWTH TO DATE CULTURE WILL BE HELD FOR 5 DAYS BEFORE ISSUING A FINAL NEGATIVE REPORT   Report Status PENDING   Incomplete   CULTURE, BLOOD (ROUTINE X 2)     Status: Normal (Preliminary result)   Collection Time   05/04/12 10:00 AM      Component Value Range Status Comment   Specimen Description BLOOD HAND LEFT   Final    Special Requests     Final    Value: BOTTLES DRAWN AEROBIC AND ANAEROBIC BLUE 10CC  RED 6CC   Culture  Setup Time 05/04/2012 15:27   Final    Culture  Final    Value:        BLOOD CULTURE RECEIVED NO GROWTH TO DATE CULTURE WILL BE HELD FOR 5 DAYS BEFORE ISSUING A FINAL NEGATIVE REPORT   Report Status PENDING   Incomplete   MRSA PCR SCREENING     Status: Normal   Collection Time   05/04/12  5:00 PM      Component Value Range Status Comment   MRSA by PCR NEGATIVE  NEGATIVE Final     Radiology Reports Dg Chest 2 View  04/22/2012  *RADIOLOGY REPORT*  Clinical Data: Chest pain and weakness.  CHEST - 2 VIEW  Comparison: 03/17/2012 and chest CT 10/26/2010  Findings: Two views of the chest were obtained.  Again noted are old left rib fractures.  In addition, there are chronic densities in the right upper chest related to the right scapula and posterior ribs based on previous CT.  Limited evaluation of the lung bases due to patient positioning and body habitus.  Heart and mediastinum appear grossly stable.  Postsurgical changes in the lower cervical spine.  IMPRESSION: Limited examination without acute findings.  There are chronic densities in the right upper chest as described. Evidence of old left rib fractures.   Original Report  Authenticated By: Richarda Overlie, M.D.    Mr Pelvis W Wo Contrast  05/04/2012  *RADIOLOGY REPORT*  Clinical Data: Sacral decubitus ulcer with foul-smelling discharge. History of osteomyelitis treated with antibiotics.  MRI PELVIS WITHOUT AND WITH CONTRAST  Technique:  Multiplanar multisequence MR imaging of the pelvis was performed both before and after administration of intravenous contrast.  Contrast: 20mL MULTIHANCE GADOBENATE DIMEGLUMINE 529 MG/ML IV SOLN  Comparison: Pelvic CTs 02/29/2012 and 01/18/2012.  Findings: There is chronic dislocation of the right hip and chronic extensive fragmentation of the right femoral head and neck.  There is extensive heterotopic ossification surrounding the dislocated femoral head.  There is a deep decubitus ulcer posterior to the dislocated proximal femur which extends to the bone as on the prior CT. There are multiple peripherally enhancing fluid collections associated with the dislocated femoral head and lateral to the proximal femur.  The latter are incompletely imaged.  There is diffuse surrounding synovial and muscular enhancement. Edema and enhancement track along the right iliacus muscle.  There is irregular sclerosis, edema and enhancement within the proximal right femur.  Similar involvement is present within the right iliac bone and acetabulum.  There is chronic deformity of the right ischium.  The sacroiliac joints are chronically fused without adjacent marrow edema.  There is chronic deformity of the proximal left femur with surrounding heterotopic ossification.  The left femur demonstrates no suspicious marrow signal or enhancement.  The patient has a descending colostomy and indwelling bladder catheter.  Multiple prominent pelvic and inguinal lymph nodes are present, likely reactive and similar to prior CT.  No intrapelvic fluid collections are identified.  IMPRESSION:  1.  Chronic dislocation of the right hip and fragmentation of the proximal right femur are  similar to prior CT.  Comparison between modalities is limited. 2.  Sclerosis, edema and enhancement within the proximal femur and adjacent acetabulum are consistent with chronic osteomyelitis. Superimposed acute osteomyelitis is likely. 3.  Extensive inflammatory changes surrounding the right hip with large irregular peripherally enhancing fluid collections, synovial enhancement and muscular enhancement adjacent to a large decubitus ulcer.  These findings are consistent with chronic soft tissue infection with abscess formation.   Original Report Authenticated By: Carey Bullocks, M.D.    Ir  Fluoro Guide Cv Line Right  04/22/2012  *RADIOLOGY REPORT*  Clinical Data: 46 year old with sepsis.  PICC LINE PLACEMENT WITH ULTRASOUND AND FLUOROSCOPIC  GUIDANCE  Fluoroscopy Time: 1.1 minutes.  The right arm was prepped with chlorhexidine, draped in the usual sterile fashion using maximum barrier technique (cap and mask, sterile gown, sterile gloves, large sterile sheet, hand hygiene and cutaneous antisepsis) and infiltrated locally with 1% Lidocaine.  Ultrasound demonstrated patency of the right brachial vein, and this was documented with an image.  Under real-time ultrasound guidance, this vein was accessed with a 21 gauge micropuncture needle and image documentation was performed.  The needle was exchanged over a guidewire for a peel-away sheath through which a five [place] dual lumen PICC trimmed to 52 cm was advanced, positioned with its tip at the lower SVC/right atrial junction. Fluoroscopy during the procedure and fluoro spot radiograph confirms appropriate catheter position.  The catheter was flushed, secured to the skin with Prolene sutures, and covered with a sterile dressing.  Complications:  None  IMPRESSION: Successful right arm PICC line placement with ultrasound and fluoroscopic guidance.  The catheter is ready for use.   Original Report Authenticated By: Richarda Overlie, M.D.    Ir US Guide Vasc Access  Right  04/22/2012  *RADIOLOGY REPORT*  Clinical Data: 46 year old with sepsis.  PICC LINE PLACEMENT WITH ULTRASOUND AND FLUOROSCOPIC  GUIDANCE  Fluoroscopy Time: 1.1 minutes.  The right arm was prepped with chlorhexidine, draped in the usual sterile fashion using maximum barrier technique (cap and mask, sterile gown, sterile gloves, large sterile sheet, hand hygiene and cutaneous antisepsis) and infiltrated locally with 1% Lidocaine.  Ultrasound demonstrated patency of the right brachial vein, and this was documented with an image.  Under real-time ultrasound guidance, this vein was accessed with a 21 gauge micropuncture needle and image documentation was performed.  The needle was exchanged over a guidewire for a peel-away sheath through which a five [place] dual lumen PICC trimmed to 52 cm was advanced, positioned with its tip at the lower SVC/right atrial junction. Fluoroscopy during the procedure and fluoro spot radiograph confirms appropriate catheter position.  The catheter was flushed, secured to the skin with Prolene sutures, and covered with a sterile dressing.  Complications:  None  IMPRESSION: Successful right arm PICC line placement with ultrasound and fluoroscopic guidance.  The catheter is ready for use.   Original Report Authenticated By: Richarda Overlie, M.D.    Dg Chest Portable 1 View  05/04/2012  *RADIOLOGY REPORT*  Clinical Data: Congestion, chills  PORTABLE CHEST - 1 VIEW  Comparison: 04/22/2012, 10/26/2010 CT  Findings: Prominent cardiomediastinal contours. Multiple small calcific densities projecting over the right upper lung and scapula, likely correspond to post-traumatic changes described on prior CT. Mild dependent atelectasis.  No pleural effusion or pneumothorax.  Prior left rib fractures again noted.  Mild rightward thoracic spine curvature may be exaggerated by positioning. Right PICC tip projects over the proximal SVC.  IMPRESSION: Chronic changes as above.  Cardiomediastinal prominence  is similar to prior.   Original Report Authenticated By: Jearld Lesch, M.D.     CBC  Lab 05/07/12 0520 05/06/12 0655 05/04/12 0436  WBC 16.1* 14.6* 17.9*  HGB 6.7* 6.4* 7.2*  HCT 22.2* 21.1* 22.8*  PLT 503* 449* 468*  MCV 73.8* 73.5* 72.2*  MCH 22.3* 22.3* 22.8*  MCHC 30.2 30.3 31.6  RDW 18.0* 18.0* 17.6*  LYMPHSABS -- -- 2.7  MONOABS -- -- 2.3*  EOSABS -- -- 0.4  BASOSABS -- --  0.0  BANDABS -- -- --    Chemistries   Lab 05/07/12 0520 05/06/12 0655 05/04/12 0436  NA 139 135 131*  K 3.2* 3.1* 3.8  CL 105 102 98  CO2 26 22 22   GLUCOSE 113* 104* 115*  BUN 7 7 17   CREATININE 0.38* 0.38* 0.41*  CALCIUM 8.6 8.5 9.1  MG -- -- --  AST -- -- --  ALT -- -- --  ALKPHOS -- -- --  BILITOT -- -- --   ------------------------------------------------------------------------------------------------------------------ estimated creatinine clearance is 150.4 ml/min (by C-G formula based on Cr of 0.38). ------------------------------------------------------------------------------------------------------------------ No results found for this basename: HGBA1C:2 in the last 72 hours ------------------------------------------------------------------------------------------------------------------ No results found for this basename: CHOL:2,HDL:2,LDLCALC:2,TRIG:2,CHOLHDL:2,LDLDIRECT:2 in the last 72 hours ------------------------------------------------------------------------------------------------------------------ No results found for this basename: TSH,T4TOTAL,FREET3,T3FREE,THYROIDAB in the last 72 hours ------------------------------------------------------------------------------------------------------------------ No results found for this basename: VITAMINB12:2,FOLATE:2,FERRITIN:2,TIBC:2,IRON:2,RETICCTPCT:2 in the last 72 hours  Coagulation profile No results found for this basename: INR:5,PROTIME:5 in the last 168 hours  No results found for this basename: DDIMER:2 in the last  72 hours  Cardiac Enzymes No results found for this basename: CK:3,CKMB:3,TROPONINI:3,MYOGLOBIN:3 in the last 168 hours ------------------------------------------------------------------------------------------------------------------ No components found with this basename: POCBNP:3

## 2012-05-07 NOTE — Progress Notes (Signed)
[facility]  Day # 4 of antbiotics Day # 4 vancomycin Day #4 imipenem  Subjective: [name]   Antibiotics:  Anti-infectives     Start     Dose/Rate Route Frequency Ordered Stop   05/04/12 2200   vancomycin (VANCOCIN) 1,500 mg in sodium chloride 0.9 % 500 mL IVPB        1,500 mg 250 mL/hr over 120 Minutes Intravenous Every 12 hours 05/04/12 0909     05/04/12 1600   imipenem-cilastatin (PRIMAXIN) 500 mg in sodium chloride 0.9 % 100 mL IVPB        500 mg 200 mL/hr over 30 Minutes Intravenous Every 6 hours 05/04/12 0909     05/04/12 0915   vancomycin (VANCOCIN) 500 mg in sodium chloride 0.9 % 100 mL IVPB        500 mg 100 mL/hr over 60 Minutes Intravenous To Emergency Dept 05/04/12 0907 05/04/12 1126   05/04/12 0915   imipenem-cilastatin (PRIMAXIN) 500 mg in sodium chloride 0.9 % 100 mL IVPB        500 mg 200 mL/hr over 30 Minutes Intravenous To Emergency Dept 05/04/12 0907 05/04/12 1346   05/04/12 0615   vancomycin (VANCOCIN) IVPB 1000 mg/200 mL premix        1,000 mg 200 mL/hr over 60 Minutes Intravenous  Once 05/04/12 0611 05/04/12 0821   05/04/12 0615  piperacillin-tazobactam (ZOSYN) IVPB 3.375 g       3.375 g 12.5 mL/hr over 240 Minutes Intravenous  Once 05/04/12 0611 05/04/12 1059          Medications: Scheduled Meds:   . baclofen  20 mg Oral QID  . docusate sodium  100 mg Oral BID  . enoxaparin (LOVENOX) injection  40 mg Subcutaneous Q24H  . famotidine  20 mg Oral BID  . ferrous sulfate  325 mg Oral TID WC  . furosemide  20 mg Oral Daily  . imipenem-cilastatin  500 mg Intravenous Q6H  . lactose free nutrition  237 mL Oral BID BM  . metoCLOPramide  10 mg Oral BID WC  . multivitamin with minerals  1 tablet Oral Daily  . nutrition supplement  1 packet Oral BID BM  . potassium chloride SA  20 mEq Oral Daily  . vancomycin  1,500 mg Intravenous Q12H  . vitamin C  500 mg Oral BID  . zinc sulfate  220 mg Oral BID    Continuous Infusions:   . sodium chloride 50 mL/hr at 05/07/12 1015   PRN Meds:.acetaminophen, acetaminophen, albuterol   Objective: Weight change:   Intake/Output Summary (Last 24 hours) at 05/07/12 1654 Last data filed at 05/07/12 1300  Gross per 24 hour  Intake   2183 ml  Output   4550 ml  Net  -2367 ml   Blood pressure 131/64, pulse 91, temperature 98.4 F (36.9 C), temperature source Oral, resp. rate 20, height 6' (1.829 m), weight 246 lb 0.5 oz (111.6 kg), SpO2 98.00%. Temp:  [98.4 F (36.9 C)-99.4 F (37.4 C)] 98.4 F (36.9 C) (12/23 1400) Pulse Rate:  [78-98] 91  (12/23 0626) Resp:  [20-23] 20  (12/23 1400) BP: (119-154)/(39-91) 131/64 mmHg (12/23 1400) SpO2:  [97 %-100 %] 98 % (12/23 1400)  Physical Exam: General: Alert and awake, oriented x3, morbidly obese he became diaphoretic while we did the exam of his wound  HEENT: anicteric sclera, pupils reactive to light and accommodation, EOMI, oropharynx clear and without exudate  CVS tachycardic rate,  normal r, no murmur rubs or gallops  Chest: clear to auscultation bilaterally, no wheezing, rales or rhonchi  Abdomen: soft nontender, nondistended, his ostomy bag is full of brown stool  Skin: did not examine decubitus ulcer today Neuro: Quadriplegic   Lab Results:  Basename 05/07/12 0520 05/06/12 0655  WBC 16.1* 14.6*  HGB 6.7* 6.4*  HCT 22.2* 21.1*  PLT 503* 449*    BMET  Basename 05/07/12 0520 05/06/12 0655  NA 139 135  K 3.2* 3.1*  CL 105 102  CO2 26 22  GLUCOSE 113* 104*  BUN 7 7  CREATININE 0.38* 0.38*  CALCIUM 8.6 8.5    Micro Results: Recent Results (from the past 240 hour(s))  URINE CULTURE     Status: Normal   Collection Time   05/04/12  4:41 AM      Component Value Range Status Comment   Specimen Description URINE, CATHETERIZED   Final    Special Requests CX ADDED AT 0505 ON [id number]   Final    Culture  Setup Time 05/04/2012 05:29   Final    Colony Count >=100,000 COLONIES/ML    Final    Culture     Final    Value: PSEUDOMONAS AERUGINOSA     YEAST   Report Status 05/06/2012 FINAL   Final    Organism ID, Bacteria PSEUDOMONAS AERUGINOSA   Final   WOUND CULTURE     Status: Normal   Collection Time   05/04/12  5:47 AM      Component Value Range Status Comment   Specimen Description WOUND BUTTOCK   Final    Special Requests Normal   Final    Gram Stain     Final    Value: MODERATE WBC PRESENT,BOTH PMN AND MONONUCLEAR     RARE SQUAMOUS EPITHELIAL CELLS PRESENT     FEW GRAM NEGATIVE RODS     RARE GRAM POSITIVE COCCI     IN PAIRS   Culture     Final    Value: FEW METHICILLIN RESISTANT STAPHYLOCOCCUS AUREUS     Note: RIFAMPIN AND GENTAMICIN SHOULD NOT BE USED AS SINGLE DRUGS FOR TREATMENT OF STAPH INFECTIONS. This organism is presumed to be Clindamycin resistant based on detection of inducible Clindamycin resistance. CRITICAL RESULT CALLED TO, READ BACK BY AND      VERIFIED WITH: [name] 05/07/12 1310 [name]   Report Status 05/07/2012 FINAL   Final    Organism ID, Bacteria METHICILLIN RESISTANT STAPHYLOCOCCUS AUREUS   Final   CULTURE, BLOOD (ROUTINE X 2)     Status: Normal (Preliminary result)   Collection Time   05/04/12  9:50 AM      Component Value Range Status Comment   Specimen Description BLOOD HAND LEFT   Final    Special Requests BOTTLES DRAWN AEROBIC ONLY 10CC   Final    Culture  Setup Time 05/04/2012 20:22   Final    Culture     Final    Value:        BLOOD CULTURE RECEIVED NO GROWTH TO DATE CULTURE WILL BE HELD FOR 5 DAYS BEFORE ISSUING A FINAL NEGATIVE REPORT   Report Status PENDING   Incomplete   CULTURE, BLOOD (ROUTINE X 2)     Status: Normal (Preliminary result)   Collection Time   05/04/12 10:00 AM      Component Value Range Status Comment   Specimen Description BLOOD HAND LEFT   Final    Special Requests  Final    Value: BOTTLES DRAWN AEROBIC AND ANAEROBIC BLUE 10CC  RED 6CC   Culture  Setup Time 05/04/2012 15:27   Final     Culture     Final    Value:        BLOOD CULTURE RECEIVED NO GROWTH TO DATE CULTURE WILL BE HELD FOR 5 DAYS BEFORE ISSUING A FINAL NEGATIVE REPORT   Report Status PENDING   Incomplete   MRSA PCR SCREENING     Status: Normal   Collection Time   05/04/12  5:00 PM      Component Value Range Status Comment   MRSA by PCR NEGATIVE  NEGATIVE Final     Studies/Results: No results found.    Assessment/Plan: [name] [name] is a 46 y.o. male  with stage IV decubitus ulcer admitted with her concerning for sepsis, bacteremia with a possible source being his draining purulent decubitus ulcer   #1 sepsis: Agree with vancomycin and imipenem for now and that he was recently on cefepime and developed this septic picture on that antibiotics. Pt grew MRSA from stage IV and Cefepime S pseudomonas from bladder. Latter finding makes me wonder about collection and if this was not a bacteria in the tubing rather than in bladder itself since cefepime should have had nice activitry vs this organism. 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 Kelly Splinter to see pt) and wound care following closely --I anticipate we will be giving [name] [name] another 6-8 week course of IV abx      LOS: 3 days   Acey Lav 05/07/2012, 4:54 PM
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
## 2012-05-07 NOTE — Care Management Note (Signed)
  Page 2 of 2   05/22/2012     10:03:14 AM   CARE MANAGEMENT NOTE 05/22/2012  Patient:  [name] [name],[name] [name]   Account Number:  [id number]  Date Initiated:  05/07/2012  Documentation initiated by:  Ronny Flurry  Subjective/Objective Assessment:     Action/Plan:   Anticipated DC Date:  05/22/2012   Anticipated DC Plan:  HOME W HOME HEALTH SERVICES         Choice offered to / List presented to:  C-1 Patient        HH arranged  HH-1 RN  HH-2 PT  HH-3 OT  HH-4 NURSE'S AIDE  HH-7 RESPIRATORY THERAPY      HH agency  [facility]   Status of service:  Completed, signed off Medicare Important Message given?   (If response is "NO", the following Medicare IM given date fields will be blank) Date Medicare IM given:   Date Additional Medicare IM given:    Discharge Disposition:  HOME W HOME HEALTH SERVICES  Per UR Regulation:  Reviewed for med. necessity/level of care/duration of stay  If discussed at Long Length of Stay Meetings, dates discussed:    Comments:  05-22-12 Confirmed with Shawn Rayburn PA , have [facility] deliver wound VAC today , have patient take wound VAC home with him and [facility] .  Ines Bloomer does not need to see the patient today prior to discharge.   Ronny Flurry RN BSN  [phone]    05-21-12 Diet being advanced today , spoke with DR Susie Cassette , plan discharge in am . Called Dianna  at [name] , [facility] will be delivered tomorrow 05-22-12 between 0900 and 1300. Ronny Flurry RN BSN [phone]   05-18-12 [facility] wound VAC has been approved . Order placed on hold . [facility] will hold the release of the VAC until May 31, 2012 . When patietn medically ready for discharge, call Dianna at [facility] [phone] for VAC to be delivered.  Ronny Flurry RN BSN [phone]    05-15-12 faxed completed application for wound VAC to [facility]. Rickie with [facility] aware also . ' Ronny Flurry RN BSn [phone]   05-15-12 Facesheet information incorrect . Updated  information  : [address] , [place] , [place]  [address] , Home [phone]  Confirmed patient does want to go home with [facility] , VAC and IV antibiotics .  Started application for [facility]  and faxed to DR Sangar at [phone] , spoke to DR Loss adjuster, chartered ( phone number [phone]) she will complete application and sign and fax back  Ronny Flurry RN BSN [phone]    05-07-12 Referral for LTAC , patient has [facility] , confirmed with [employer] [facility] [employer] that [facility] does not have LTAC benefit .  Paged DR Thedore Mins  .  Ronny Flurry RN BSN [phone]

## 2012-05-07 NOTE — Progress Notes (Signed)
PT HYDROTHERAPY EVALUATION NOTE   05/07/12 1300  Subjective Assessment  Subjective Pt agreeable to hydrotherapy  Patient and Family Stated Goals heal wounds  Date of Onset (prior to admission)  Prior Treatments Previous wound care; hydro therapy; dressing changes with home health  Evaluation and Treatment  Evaluation and Treatment Procedures Explained to Patient/Family Yes  Evaluation and Treatment Procedures agreed to  Pressure Ulcer 04/22/12 Stage II -  Partial thickness loss of dermis presenting as a shallow open ulcer with a red, pink wound bed without slough.  Final Assessment Date/Final Assessment Time: 04/26/12 1608  Date First Assessed/Time First Assessed: 04/22/12 1445   Location: Knee  Location Orientation: Left  Staging: Stage II -  Partial thickness loss of dermis presenting as a shallow open ulcer with  Dressing Type ABD  Dressing Clean;Dry;Intact  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  Date First Assessed/Time First Assessed: 05/04/12 2030   Location: Buttocks  Location Orientation: Right  Staging: Stage IV - Full thickness tissue loss with exposed bone, tendon or muscle.  Present on Admission: Yes  State of Healing Early/partial granulation  Site / Wound Assessment Pink;Red;Bleeding  % Wound base Red or Granulating 65%  % Wound base Yellow 25%  % Wound base Black 0%  % Wound base Other (Comment) 5% (exposed tendon and floating bone)  Peri-wound Assessment Edema;Erythema (blanchable);Maceration;Pink  Wound Length (cm) 10 cm  Wound Width (cm) 21 cm  Wound Depth (cm) 6 cm  Tunneling (cm) 4 cm tunnelling at 1:00 and 6 cm tunnelling at 7:00  Margins Unattacted edges (unapproximated)  Drainage Amount Moderate  Drainage Description Serosanguineous;Purulent;Green;Odor  Treatment Hydrotherapy (Pulse lavage);Packing (Saline gauze)  Dressing Type ABD;Barrier Film (skin prep);Gauze (Comment);Moist to dry;Tape dressing   Dressing Clean;Dry;Intact  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  Date First Assessed/Time First Assessed: 05/04/12 2030   Location: Sacrum  Location Orientation: Medial  Staging: Stage IV - Full thickness tissue loss with exposed bone, tendon or muscle.  Present on Admission: Yes  Dressing Type Gauze (Comment);Moist to dry  Dressing Clean;Dry;Intact  Hydrotherapy  Pulsed lavage therapy - wound location Right Ischial Wound  Pulsed Lavage with Suction (psi) 4 psi  Pulsed Lavage with Suction - Normal Saline Used 1000 mL  Pulsed Lavage Tip Tip with splash shield  Selective Debridement  Selective Debridement - Location Right Ischial tuberosity wound margins  Selective Debridement - Tools Used Scissors;Forceps  Selective Debridement - Tissue Removed slough and maceratic necrotic tissue  Wound Therapy - Assess/Plan/Recommendations  Wound Therapy - Clinical Statement Pt presents with stage 4 pressure wounds to his sacrum and buttocks.  Patient previously treated with hydrotherapy on prior admission.  Patient wounds present now with increased draining and necrotic tissue particularly in the upper right buttock region.  Wound margins appear to have increased in depth since prior admission with advance tunneling present.  Will contine to provide hydrotherapy to address necrotic tissue and facilitate wound healing.  Wound Therapy - Functional Problem List decreased skin integrity secondary to immobility  Factors Delaying/Impairing Wound Healing Altered sensation;Diabetes Mellitus;Infection - systemic/local;Immobility;Multiple medical problems;Vascular compromise  Hydrotherapy Plan Debridement;Dressing change;Patient/family education;Pulsatile lavage with suction  Wound Therapy - Frequency 6X / week  Wound Therapy - Follow Up Recommendations Home health RN  Wound Plan Continue hydrotherapy as indicated  Wound Therapy Goals - Improve the  function of patient's integumentary system by progressing the wound(s)  through the phases of wound healing by:  Decrease Necrotic Tissue to 10%  Decrease Necrotic Tissue - Progress Goal set today  Increase Granulation Tissue to 90%  Increase Granulation Tissue - Progress Goal set today  Goals/treatment plan/discharge plan were made with and agreed upon by patient/family Yes  Time For Goal Achievement 7 days  Wound Therapy - Potential for Goals Fair     Charlotte Crumb, PT DPT  [phone]

## 2012-05-08 ENCOUNTER — Encounter (HOSPITAL_COMMUNITY): Payer: Self-pay | Admitting: Physician Assistant

## 2012-05-08 DIAGNOSIS — A419 Sepsis, unspecified organism: Secondary | ICD-10-CM

## 2012-05-08 LAB — CBC
HCT: 28.4 % — ABNORMAL LOW (ref 39.0–52.0)
MCHC: 30.3 g/dL (ref 30.0–36.0)
RDW: 18.5 % — ABNORMAL HIGH (ref 11.5–15.5)

## 2012-05-08 LAB — BASIC METABOLIC PANEL
BUN: 10 mg/dL (ref 6–23)
Creatinine, Ser: 0.36 mg/dL — ABNORMAL LOW (ref 0.50–1.35)
GFR calc Af Amer: >90 mL/min (ref >90)
GFR calc non Af Amer: >90 mL/min (ref >90)

## 2012-05-08 LAB — TYPE AND SCREEN
Antibody Screen: NEGATIVE
Unit division: 0

## 2012-05-08 LAB — HEMOGLOBIN AND HEMATOCRIT, BLOOD
HCT: 27.9 % — ABNORMAL LOW (ref 39.0–52.0)
Hemoglobin: 8.7 g/dL — ABNORMAL LOW (ref 13.0–17.0)

## 2012-05-08 MED ORDER — POTASSIUM CHLORIDE 10 MEQ/100ML IV SOLN
INTRAVENOUS | Status: AC
  Administered 2012-05-08: 10 meq via INTRAVENOUS
  Filled 2012-05-08: qty 100

## 2012-05-08 MED ORDER — ONDANSETRON HCL 4 MG PO TABS
4.0000 mg | ORAL_TABLET | Freq: Once | ORAL | Status: AC
  Administered 2012-05-09: 4 mg via ORAL
  Filled 2012-05-08: qty 1

## 2012-05-08 MED ORDER — POTASSIUM CHLORIDE 10 MEQ/100ML IV SOLN
INTRAVENOUS | Status: AC
  Filled 2012-05-08: qty 100

## 2012-05-08 MED ORDER — POTASSIUM CHLORIDE 10 MEQ/100ML IV SOLN
10.0000 meq | INTRAVENOUS | Status: AC
  Administered 2012-05-08 (×4): 10 meq via INTRAVENOUS
  Filled 2012-05-08 (×3): qty 100

## 2012-05-08 MED ORDER — SODIUM CHLORIDE 0.9 % IV SOLN
1.0000 g | Freq: Three times a day (TID) | INTRAVENOUS | Status: DC
  Administered 2012-05-08 – 2012-05-17 (×25): 1 g via INTRAVENOUS
  Filled 2012-05-08 (×30): qty 1

## 2012-05-08 MED ORDER — POTASSIUM CHLORIDE IN NACL 40-0.9 MEQ/L-% IV SOLN
INTRAVENOUS | Status: DC
  Administered 2012-05-08: 12:00:00 via INTRAVENOUS
  Filled 2012-05-08 (×2): qty 1000

## 2012-05-08 MED ORDER — ONDANSETRON HCL 4 MG/5ML PO SOLN: 4.0000 mg | Freq: Once | ORAL | Status: DC

## 2012-05-08 MED ORDER — SODIUM CHLORIDE 0.9 % IJ SOLN
10.0000 mL | INTRAMUSCULAR | Status: DC | PRN
  Administered 2012-05-08 – 2012-05-21 (×17): 10 mL
  Administered 2012-05-22: 20 mL
  Administered 2012-05-22: 10 mL

## 2012-05-08 MED ORDER — POTASSIUM CHLORIDE CRYS ER 20 MEQ PO TBCR
40.0000 meq | EXTENDED_RELEASE_TABLET | Freq: Once | ORAL | Status: AC
  Administered 2012-05-08: 40 meq via ORAL
  Filled 2012-05-08: qty 2

## 2012-05-08 NOTE — Progress Notes (Addendum)
[facility] PROGRESS NOTE  [name] [name] ZOX:[id number] DOB: 05-Oct-1965 DOA: 05/04/2012 PCP: [name], MD  Assessment/Plan: Principal Problem:  *Sepsis Active Problems:  OSA (obstructive sleep apnea)  Obesity  Quadriplegia  Seizure disorder  HTN (hypertension)  Sacral decubitus ulcer, stage IV  Recurrent UTI   Brief summary.  46 year old quadriplegic African American male with history of chronic sacral diabetes ulcer stage IV and left knee wound, with diverting colostomy and chronic suprapubic catheter, who has had history of possible eye to send her current UTIs and soft tissue infection presents to the hospital with sepsis most likely due to infection around the sacral decubitus ulcer with osteomyelitis and possible abscess.  Asian follows with plastic surgeon Dr. Helyn Numbers, he was seen here by Dr. Rennis Chris orthopedic surgery who differed the case to Dr. Helyn Numbers, have discussed the case with Dr. Helyn Numbers who see the patient shortly. Patient is being seen by ID is on broad-spectrum antibiotic continue to monitor cultures. Currently nontoxic appearing.    1. Sepsis most likely due to infection from the chronic stage IV sacral decubitus ulcer and adjacent osteomyelitis with now multiple abscesses seen on MRI   - Blood cultures are negative to date, wound cultures growing staph aureus along with some gram-negative rods, urine culture grew Pseudomonas, will monitor culture and sensitivity, appreciate ID input, continue vancomycin along with imipenem, discussed the case with orthopedic surgeon Dr. Rennis Chris who recommended that case be discussed with Dr. Helyn Numbers plastic surgeon as it was too complex for his capabilities, I called Dr. Helyn Numbers on [phone], she will see the patient shortly, Continue local wound care for his ulcers.   2. Quadriplegia - with colostomy, suprapubic catheter which is changed the 2 months, urologist Dr. Isabel Caprice. No acute issues continue supportive care.  3. History of  recurrent UTIs - UTI versus colonization, noted cultures, antibiotics for #1 above will suffice.  4. Hypertension - blood pressure stable continue present regimen , discontinue Lasix 5. History of seizures - no acute issues we'll monitor clinically. He is not on any anti-seizure medications at home.  6. Low potassium - replace and check magnesium 7.anemia status post transfusion of 2 units of packed red blood cells    Code Status: Full  Family Communication: Discussed with the patient  Disposition Plan: LTAC rehabilitation    Procedures MRI  Consults ID, orthopedics case discussed with Dr. Rennis Chris @ [phone] on 05/06/2012 he recommended to discuss it with Dr. Helyn Numbers, called plastic surgeon Dr. Helyn Numbers on 05/06/2012 @ [phone]    DVT Prophylaxis Lovenox   HPI/Subjective: , Afebrile, stable overnight  Objective: Filed Vitals:   05/07/12 2339 05/07/12 2348 05/08/12 0026 05/08/12 0645  BP:  164/84 141/80 145/78  Pulse: 72 63 78 75  Temp:  97.9 F (36.6 C) 98.6 F (37 C) 97.1 F (36.2 C)  TempSrc:  Axillary Axillary   Resp: 22  16 18   Height:      Weight:      SpO2: 97%   99%    Intake/Output Summary (Last 24 hours) at 05/08/12 0848 Last data filed at 05/08/12 0522  Gross per 24 hour  Intake 3430.84 ml  Output   5200 ml  Net -1769.16 ml    Exam:  Awake Alert, Oriented X 3, No new F.N deficits, Normal affect  .AT,PERRAL  Supple Neck,No JVD, No cervical lymphadenopathy appriciated.  Symmetrical Chest wall movement, Good air movement bilaterally, CTAB  RRR,No Gallops,Rubs or new Murmurs, No Parasternal Heave  +ve B.Sounds, Abd Soft, Non tender,  No organomegaly appriciated, No rebound - guarding or rigidity.  No Cyanosis, Clubbing or edema, No new Rash or bruise, large stage IV sacral inhibitors ulcer, also left knee wound, has suprapubic catheter, colostomy with stool. R Arm PICC.       Data Reviewed: Basic Metabolic Panel:  Lab 05/08/12 1478 05/07/12  0520 05/06/12 0655 05/04/12 0436  NA 141 139 135 131*  K 2.8* 3.2* 3.1* 3.8  CL 104 105 102 98  CO2 28 26 22 22   GLUCOSE 116* 113* 104* 115*  BUN 10 7 7 17   CREATININE 0.36* 0.38* 0.38* 0.41*  CALCIUM 8.8 8.6 8.5 9.1  MG -- -- -- --  PHOS -- -- -- --    Liver Function Tests: No results found for this basename: AST:5,ALT:5,ALKPHOS:5,BILITOT:5,PROT:5,ALBUMIN:5 in the last 168 hours No results found for this basename: LIPASE:5,AMYLASE:5 in the last 168 hours No results found for this basename: AMMONIA:5 in the last 168 hours  CBC:  Lab 05/08/12 0500 05/08/12 0430 05/07/12 0520 05/06/12 0655 05/04/12 0436  WBC 16.8* -- 16.1* 14.6* 17.9*  NEUTROABS -- -- -- -- 12.5*  HGB 8.6* 8.7* 6.7* 6.4* 7.2*  HCT 28.4* 27.9* 22.2* 21.1* 22.8*  MCV 77.2* -- 73.8* 73.5* 72.2*  PLT 470* -- 503* 449* 468*    Cardiac Enzymes: No results found for this basename: CKTOTAL:5,CKMB:5,CKMBINDEX:5,TROPONINI:5 in the last 168 hours BNP (last 3 results) No results found for this basename: PROBNP:3 in the last 8760 hours   CBG: No results found for this basename: GLUCAP:5 in the last 168 hours  Recent Results (from the past 240 hour(s))  URINE CULTURE     Status: Normal   Collection Time   05/04/12  4:41 AM      Component Value Range Status Comment   Specimen Description URINE, CATHETERIZED   Final    Special Requests CX ADDED AT 0505 ON [id number]   Final    Culture  Setup Time 05/04/2012 05:29   Final    Colony Count >=100,000 COLONIES/ML   Final    Culture     Final    Value: PSEUDOMONAS AERUGINOSA     YEAST   Report Status 05/06/2012 FINAL   Final    Organism ID, Bacteria PSEUDOMONAS AERUGINOSA   Final   WOUND CULTURE     Status: Normal   Collection Time   05/04/12  5:47 AM      Component Value Range Status Comment   Specimen Description WOUND BUTTOCK   Final    Special Requests Normal   Final    Gram Stain     Final    Value: MODERATE WBC PRESENT,BOTH PMN AND MONONUCLEAR     RARE SQUAMOUS  EPITHELIAL CELLS PRESENT     FEW GRAM NEGATIVE RODS     RARE GRAM POSITIVE COCCI     IN PAIRS   Culture     Final    Value: FEW METHICILLIN RESISTANT STAPHYLOCOCCUS AUREUS     Note: RIFAMPIN AND GENTAMICIN SHOULD NOT BE USED AS SINGLE DRUGS FOR TREATMENT OF STAPH INFECTIONS. This organism is presumed to be Clindamycin resistant based on detection of inducible Clindamycin resistance. CRITICAL RESULT CALLED TO, READ BACK BY AND      VERIFIED WITH: [name] 05/07/12 1310 [name]   Report Status 05/07/2012 FINAL   Final    Organism ID, Bacteria METHICILLIN RESISTANT STAPHYLOCOCCUS AUREUS   Final   CULTURE, BLOOD (ROUTINE X 2)     Status: Normal (Preliminary result)  Collection Time   05/04/12  9:50 AM      Component Value Range Status Comment   Specimen Description BLOOD HAND LEFT   Final    Special Requests BOTTLES DRAWN AEROBIC ONLY 10CC   Final    Culture  Setup Time 05/04/2012 20:22   Final    Culture     Final    Value:        BLOOD CULTURE RECEIVED NO GROWTH TO DATE CULTURE WILL BE HELD FOR 5 DAYS BEFORE ISSUING A FINAL NEGATIVE REPORT   Report Status PENDING   Incomplete   CULTURE, BLOOD (ROUTINE X 2)     Status: Normal (Preliminary result)   Collection Time   05/04/12 10:00 AM      Component Value Range Status Comment   Specimen Description BLOOD HAND LEFT   Final    Special Requests     Final    Value: BOTTLES DRAWN AEROBIC AND ANAEROBIC BLUE 10CC  RED 6CC   Culture  Setup Time 05/04/2012 15:27   Final    Culture     Final    Value:        BLOOD CULTURE RECEIVED NO GROWTH TO DATE CULTURE WILL BE HELD FOR 5 DAYS BEFORE ISSUING A FINAL NEGATIVE REPORT   Report Status PENDING   Incomplete   MRSA PCR SCREENING     Status: Normal   Collection Time   05/04/12  5:00 PM      Component Value Range Status Comment   MRSA by PCR NEGATIVE  NEGATIVE Final      Studies: Dg Chest 2 View  04/22/2012  *RADIOLOGY REPORT*  Clinical Data: Chest pain and weakness.  CHEST - 2 VIEW   Comparison: 03/17/2012 and chest CT 10/26/2010  Findings: Two views of the chest were obtained.  Again noted are old left rib fractures.  In addition, there are chronic densities in the right upper chest related to the right scapula and posterior ribs based on previous CT.  Limited evaluation of the lung bases due to patient positioning and body habitus.  Heart and mediastinum appear grossly stable.  Postsurgical changes in the lower cervical spine.  IMPRESSION: Limited examination without acute findings.  There are chronic densities in the right upper chest as described. Evidence of old left rib fractures.   Original Report Authenticated By: Richarda Overlie, M.D.    Mr Pelvis W Wo Contrast  05/04/2012  *RADIOLOGY REPORT*  Clinical Data: Sacral decubitus ulcer with foul-smelling discharge. History of osteomyelitis treated with antibiotics.  MRI PELVIS WITHOUT AND WITH CONTRAST  Technique:  Multiplanar multisequence MR imaging of the pelvis was performed both before and after administration of intravenous contrast.  Contrast: 20mL MULTIHANCE GADOBENATE DIMEGLUMINE 529 MG/ML IV SOLN  Comparison: Pelvic CTs 02/29/2012 and 01/18/2012.  Findings: There is chronic dislocation of the right hip and chronic extensive fragmentation of the right femoral head and neck.  There is extensive heterotopic ossification surrounding the dislocated femoral head.  There is a deep decubitus ulcer posterior to the dislocated proximal femur which extends to the bone as on the prior CT. There are multiple peripherally enhancing fluid collections associated with the dislocated femoral head and lateral to the proximal femur.  The latter are incompletely imaged.  There is diffuse surrounding synovial and muscular enhancement. Edema and enhancement track along the right iliacus muscle.  There is irregular sclerosis, edema and enhancement within the proximal right femur.  Similar involvement is present within the right iliac bone  and acetabulum.   There is chronic deformity of the right ischium.  The sacroiliac joints are chronically fused without adjacent marrow edema.  There is chronic deformity of the proximal left femur with surrounding heterotopic ossification.  The left femur demonstrates no suspicious marrow signal or enhancement.  The patient has a descending colostomy and indwelling bladder catheter.  Multiple prominent pelvic and inguinal lymph nodes are present, likely reactive and similar to prior CT.  No intrapelvic fluid collections are identified.  IMPRESSION:  1.  Chronic dislocation of the right hip and fragmentation of the proximal right femur are similar to prior CT.  Comparison between modalities is limited. 2.  Sclerosis, edema and enhancement within the proximal femur and adjacent acetabulum are consistent with chronic osteomyelitis. Superimposed acute osteomyelitis is likely. 3.  Extensive inflammatory changes surrounding the right hip with large irregular peripherally enhancing fluid collections, synovial enhancement and muscular enhancement adjacent to a large decubitus ulcer.  These findings are consistent with chronic soft tissue infection with abscess formation.   Original Report Authenticated By: Carey Bullocks, M.D.    Ir Fluoro Guide Cv Line Right  04/22/2012  *RADIOLOGY REPORT*  Clinical Data: 46 year old with sepsis.  PICC LINE PLACEMENT WITH ULTRASOUND AND FLUOROSCOPIC  GUIDANCE  Fluoroscopy Time: 1.1 minutes.  The right arm was prepped with chlorhexidine, draped in the usual sterile fashion using maximum barrier technique (cap and mask, sterile gown, sterile gloves, large sterile sheet, hand hygiene and cutaneous antisepsis) and infiltrated locally with 1% Lidocaine.  Ultrasound demonstrated patency of the right brachial vein, and this was documented with an image.  Under real-time ultrasound guidance, this vein was accessed with a 21 gauge micropuncture needle and image documentation was performed.  The needle was  exchanged over a guidewire for a peel-away sheath through which a five [place] dual lumen PICC trimmed to 52 cm was advanced, positioned with its tip at the lower SVC/right atrial junction. Fluoroscopy during the procedure and fluoro spot radiograph confirms appropriate catheter position.  The catheter was flushed, secured to the skin with Prolene sutures, and covered with a sterile dressing.  Complications:  None  IMPRESSION: Successful right arm PICC line placement with ultrasound and fluoroscopic guidance.  The catheter is ready for use.   Original Report Authenticated By: Richarda Overlie, M.D.    Ir US Guide Vasc Access Right  04/22/2012  *RADIOLOGY REPORT*  Clinical Data: 46 year old with sepsis.  PICC LINE PLACEMENT WITH ULTRASOUND AND FLUOROSCOPIC  GUIDANCE  Fluoroscopy Time: 1.1 minutes.  The right arm was prepped with chlorhexidine, draped in the usual sterile fashion using maximum barrier technique (cap and mask, sterile gown, sterile gloves, large sterile sheet, hand hygiene and cutaneous antisepsis) and infiltrated locally with 1% Lidocaine.  Ultrasound demonstrated patency of the right brachial vein, and this was documented with an image.  Under real-time ultrasound guidance, this vein was accessed with a 21 gauge micropuncture needle and image documentation was performed.  The needle was exchanged over a guidewire for a peel-away sheath through which a five [place] dual lumen PICC trimmed to 52 cm was advanced, positioned with its tip at the lower SVC/right atrial junction. Fluoroscopy during the procedure and fluoro spot radiograph confirms appropriate catheter position.  The catheter was flushed, secured to the skin with Prolene sutures, and covered with a sterile dressing.  Complications:  None  IMPRESSION: Successful right arm PICC line placement with ultrasound and fluoroscopic guidance.  The catheter is ready for use.   Original Report Authenticated By:  Richarda Overlie, M.D.    Dg Chest Portable 1  View  05/04/2012  *RADIOLOGY REPORT*  Clinical Data: Congestion, chills  PORTABLE CHEST - 1 VIEW  Comparison: 04/22/2012, 10/26/2010 CT  Findings: Prominent cardiomediastinal contours. Multiple small calcific densities projecting over the right upper lung and scapula, likely correspond to post-traumatic changes described on prior CT. Mild dependent atelectasis.  No pleural effusion or pneumothorax.  Prior left rib fractures again noted.  Mild rightward thoracic spine curvature may be exaggerated by positioning. Right PICC tip projects over the proximal SVC.  IMPRESSION: Chronic changes as above.  Cardiomediastinal prominence is similar to prior.   Original Report Authenticated By: Jearld Lesch, M.D.     Scheduled Meds:   . baclofen  20 mg Oral QID  . docusate sodium  100 mg Oral BID  . enoxaparin (LOVENOX) injection  40 mg Subcutaneous Q24H  . famotidine  20 mg Oral BID  . ferrous sulfate  325 mg Oral TID WC  . imipenem-cilastatin  500 mg Intravenous Q6H  . lactose free nutrition  237 mL Oral BID BM  . metoCLOPramide  10 mg Oral BID WC  . multivitamin with minerals  1 tablet Oral Daily  . nutrition supplement  1 packet Oral BID BM  . potassium chloride  10 mEq Intravenous Q1 Hr x 4  . potassium chloride SA  20 mEq Oral Daily  . potassium chloride  40 mEq Oral Once  . vancomycin  1,500 mg Intravenous Q12H  . vitamin C  500 mg Oral BID  . zinc sulfate  220 mg Oral BID   Continuous Infusions:   . 0.9 % NaCl with KCl 40 mEq / L      Principal Problem:  *Sepsis Active Problems:  OSA (obstructive sleep apnea)  Obesity  Quadriplegia  Seizure disorder  HTN (hypertension)  Sacral decubitus ulcer, stage IV  Recurrent UTI    Time spent: 40 minutes   [facility]  [facility] Pager [phone]. If 8PM-8AM, please contact night-coverage at [email or web address], password [facility] 05/08/2012, 8:48 AM  LOS: 4 days

## 2012-05-08 NOTE — Progress Notes (Signed)
PT HYDROTHERAPY PROGRESS NOTE   05/08/12 1000  Subjective Assessment  Subjective Pt agreeable to hydrotherapy  Patient and Family Stated Goals heal wounds  Date of Onset (prior to admission)  Prior Treatments Previous wound care; hydro therapy; dressing changes with home health  Evaluation and Treatment  Evaluation and Treatment Procedures Explained to Patient/Family Yes  Evaluation and Treatment Procedures agreed to  Pressure Ulcer 04/22/12 Stage II -  Partial thickness loss of dermis presenting as a shallow open ulcer with a red, pink wound bed without slough.  Final Assessment Date/Final Assessment Time: 04/26/12 1608  Date First Assessed/Time First Assessed: 04/22/12 1445   Location: Knee  Location Orientation: Left  Staging: Stage II -  Partial thickness loss of dermis presenting as a shallow open ulcer with  Dressing Type ABD  Dressing Clean;Dry;Intact  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  Date First Assessed/Time First Assessed: 05/04/12 2030   Location: Buttocks  Location Orientation: Right  Staging: Stage IV - Full thickness tissue loss with exposed bone, tendon or muscle.  Present on Admission: Yes  State of Healing Early/partial granulation  Site / Wound Assessment Pink;Red;Bleeding  % Wound base Red or Granulating 65%  % Wound base Yellow 25%  % Wound base Black 0%  % Wound base Other (Comment) 5% (exposed tendon and floating bone)  Peri-wound Assessment Edema;Erythema (blanchable);Maceration;Pink  Margins Unattacted edges (unapproximated)  Drainage Amount Moderate  Drainage Description Serosanguineous;Purulent;Green;Odor  Treatment Hydrotherapy (Pulse lavage);Packing (Saline gauze)  Dressing Type ABD;Barrier Film (skin prep);Gauze (Comment);Moist to dry;Tape dressing  Dressing Clean;Dry;Intact  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  Date First  Assessed/Time First Assessed: 05/04/12 2030   Location: Sacrum  Location Orientation: Medial  Staging: Stage IV - Full thickness tissue loss with exposed bone, tendon or muscle.  Present on Admission: Yes  Dressing Type Gauze (Comment);Moist to dry  Dressing Clean;Dry;Intact  Hydrotherapy  Pulsed lavage therapy - wound location Right Ischial Wound  Pulsed Lavage with Suction (psi) 4 psi  Pulsed Lavage with Suction - Normal Saline Used 1000 mL  Pulsed Lavage Tip Tip with splash shield  Selective Debridement  Selective Debridement - Location Right Ischial tuberosity wound margins  Selective Debridement - Tools Used Scissors;Forceps  Selective Debridement - Tissue Removed slough and maceratic necrotic tissue  Wound Therapy - Assess/Plan/Recommendations  Wound Therapy - Clinical Statement Pt presents with stage 4 pressure wounds to his sacrum and buttocks.  Wound is filled with copious amounts of purulent drainage and foul odor today despite hydrotherapy and dressing changes yesterday. Fragments of floating bone/calcium emmersed in wound bed.  Wound margins are significant in depth with advance tunneling and undermining.  Will contine to provide hydrotherapy to address necrotic tissue and facilitate wound healing.  Wound Therapy - Functional Problem List decreased skin integrity secondary to immobility  Factors Delaying/Impairing Wound Healing Altered sensation;Diabetes Mellitus;Infection - systemic/local;Immobility;Multiple medical problems;Vascular compromise  Hydrotherapy Plan Debridement;Dressing change;Patient/family education;Pulsatile lavage with suction  Wound Therapy - Frequency 6X / week  Wound Therapy - Follow Up Recommendations Home health RN  Wound Plan Continue hydrotherapy as indicated  Wound Therapy Goals - Improve the function of patient's integumentary system by progressing the wound(s) through the phases of wound healing by:  Decrease Necrotic Tissue to 10%  Decrease Necrotic  Tissue - Progress Progressing toward goal  Increase Granulation Tissue to 90%  Increase Granulation Tissue - Progress Progressing  toward goal  Goals/treatment plan/discharge plan were made with and agreed upon by patient/family Yes  Time For Goal Achievement 7 days  Wound Therapy - Potential for Goals Fair    Charlotte Crumb, PT DPT  [phone]
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
## 2012-05-08 NOTE — Progress Notes (Signed)
[facility]   Day # 5 of antbiotics Day #5 vancomycin Day #5 imipenem  Subjective: [name]   Antibiotics:  Anti-infectives     Start     Dose/Rate Route Frequency Ordered Stop   05/04/12 2200   vancomycin (VANCOCIN) 1,500 mg in sodium chloride 0.9 % 500 mL IVPB        1,500 mg 250 mL/hr over 120 Minutes Intravenous Every 12 hours 05/04/12 0909     05/04/12 1600   imipenem-cilastatin (PRIMAXIN) 500 mg in sodium chloride 0.9 % 100 mL IVPB        500 mg 200 mL/hr over 30 Minutes Intravenous Every 6 hours 05/04/12 0909     05/04/12 0915   vancomycin (VANCOCIN) 500 mg in sodium chloride 0.9 % 100 mL IVPB        500 mg 100 mL/hr over 60 Minutes Intravenous To Emergency Dept 05/04/12 0907 05/04/12 1126   05/04/12 0915   imipenem-cilastatin (PRIMAXIN) 500 mg in sodium chloride 0.9 % 100 mL IVPB        500 mg 200 mL/hr over 30 Minutes Intravenous To Emergency Dept 05/04/12 0907 05/04/12 1346   05/04/12 0615   vancomycin (VANCOCIN) IVPB 1000 mg/200 mL premix        1,000 mg 200 mL/hr over 60 Minutes Intravenous  Once 05/04/12 0611 05/04/12 0821   05/04/12 0615   piperacillin-tazobactam (ZOSYN) IVPB 3.375 g        3.375 g 12.5 mL/hr over 240 Minutes Intravenous  Once 05/04/12 0611 05/04/12 1059          Medications: Scheduled Meds:    . baclofen  20 mg Oral QID  . docusate sodium  100 mg Oral BID  . enoxaparin (LOVENOX) injection  40 mg Subcutaneous Q24H  . famotidine  20 mg Oral BID  . ferrous sulfate  325 mg Oral TID WC  . imipenem-cilastatin  500 mg Intravenous Q6H  . lactose free nutrition  237 mL Oral BID BM  . metoCLOPramide  10 mg Oral BID WC  . multivitamin with minerals  1 tablet Oral Daily  . nutrition supplement  1 packet Oral BID BM  . potassium chloride SA  20 mEq Oral Daily  . vancomycin  1,500 mg Intravenous Q12H  . vitamin C  500 mg Oral BID  . zinc sulfate  220 mg Oral BID   Continuous Infusions:    . 0.9  % NaCl with KCl 40 mEq / L 50 mL/hr at 05/08/12 1141   PRN Meds:.acetaminophen, acetaminophen, albuterol, sodium chloride   Objective: Weight change:   Intake/Output Summary (Last 24 hours) at 05/08/12 1325 Last data filed at 05/08/12 1046  Gross per 24 hour  Intake 3490.84 ml  Output   3650 ml  Net -159.16 ml   Blood pressure 115/57, pulse 74, temperature 97.7 F (36.5 C), temperature source Axillary, resp. rate 20, height 6' (1.829 m), weight 246 lb 0.5 oz (111.6 kg), SpO2 99.00%. Temp:  [97.1 F (36.2 C)-99.3 F (37.4 C)] 97.7 F (36.5 C) (12/24 1314) Pulse Rate:  [63-92] 74  (12/24 1314) Resp:  [16-22] 20  (12/24 1314) BP: (110-164)/(57-84) 115/57 mmHg (12/24 1314) SpO2:  [97 %-100 %] 99 % (12/24 1314)  Physical Exam: General: Alert and awake, oriented x3, morbidly obese he became diaphoretic while we did the exam of his wound  HEENT: anicteric sclera, pupils reactive to light and accommodation, EOMI, oropharynx clear and without exudate  CVS  tachycardic rate, normal r, no murmur rubs or gallops  Chest: clear to auscultation bilaterally, no wheezing, rales or rhonchi  Abdomen: soft nontender, nondistended, his ostomy bag is full of brown stool  Skin: did not examine decubitus ulcer today Neuro: Quadriplegic   Lab Results:  Basename 05/08/12 0500 05/08/12 0430 05/07/12 0520  WBC 16.8* -- 16.1*  HGB 8.6* 8.7* --  HCT 28.4* 27.9* --  PLT 470* -- 503*    BMET  Basename 05/08/12 0500 05/07/12 0520  NA 141 139  K 2.8* 3.2*  CL 104 105  CO2 28 26  GLUCOSE 116* 113*  BUN 10 7  CREATININE 0.36* 0.38*  CALCIUM 8.8 8.6    Micro Results: Recent Results (from the past 240 hour(s))  URINE CULTURE     Status: Normal   Collection Time   05/04/12  4:41 AM      Component Value Range Status Comment   Specimen Description URINE, CATHETERIZED   Final    Special Requests CX ADDED AT 0505 ON [id number]   Final    Culture  Setup Time 05/04/2012 05:29   Final    Colony Count  >=100,000 COLONIES/ML   Final    Culture     Final    Value: PSEUDOMONAS AERUGINOSA     YEAST   Report Status 05/06/2012 FINAL   Final    Organism ID, Bacteria PSEUDOMONAS AERUGINOSA   Final   WOUND CULTURE     Status: Normal   Collection Time   05/04/12  5:47 AM      Component Value Range Status Comment   Specimen Description WOUND BUTTOCK   Final    Special Requests Normal   Final    Gram Stain     Final    Value: MODERATE WBC PRESENT,BOTH PMN AND MONONUCLEAR     RARE SQUAMOUS EPITHELIAL CELLS PRESENT     FEW GRAM NEGATIVE RODS     RARE GRAM POSITIVE COCCI     IN PAIRS   Culture     Final    Value: FEW METHICILLIN RESISTANT STAPHYLOCOCCUS AUREUS     Note: RIFAMPIN AND GENTAMICIN SHOULD NOT BE USED AS SINGLE DRUGS FOR TREATMENT OF STAPH INFECTIONS. This organism is presumed to be Clindamycin resistant based on detection of inducible Clindamycin resistance. CRITICAL RESULT CALLED TO, READ BACK BY AND      VERIFIED WITH: [name] 05/07/12 1310 [name]   Report Status 05/07/2012 FINAL   Final    Organism ID, Bacteria METHICILLIN RESISTANT STAPHYLOCOCCUS AUREUS   Final   CULTURE, BLOOD (ROUTINE X 2)     Status: Normal (Preliminary result)   Collection Time   05/04/12  9:50 AM      Component Value Range Status Comment   Specimen Description BLOOD HAND LEFT   Final    Special Requests BOTTLES DRAWN AEROBIC ONLY 10CC   Final    Culture  Setup Time 05/04/2012 20:22   Final    Culture     Final    Value:        BLOOD CULTURE RECEIVED NO GROWTH TO DATE CULTURE WILL BE HELD FOR 5 DAYS BEFORE ISSUING A FINAL NEGATIVE REPORT   Report Status PENDING   Incomplete   CULTURE, BLOOD (ROUTINE X 2)     Status: Normal (Preliminary result)   Collection Time   05/04/12 10:00 AM      Component Value Range Status Comment   Specimen Description BLOOD HAND LEFT   Final  Special Requests     Final    Value: BOTTLES DRAWN AEROBIC AND ANAEROBIC BLUE 10CC  RED 6CC   Culture  Setup Time  05/04/2012 15:27   Final    Culture     Final    Value:        BLOOD CULTURE RECEIVED NO GROWTH TO DATE CULTURE WILL BE HELD FOR 5 DAYS BEFORE ISSUING A FINAL NEGATIVE REPORT   Report Status PENDING   Incomplete   MRSA PCR SCREENING     Status: Normal   Collection Time   05/04/12  5:00 PM      Component Value Range Status Comment   MRSA by PCR NEGATIVE  NEGATIVE Final     Studies/Results: No results found.    Assessment/Plan: [name] [name] is a 46 y.o. 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 Kelly Splinter to see pt) and wound care following closely --if patient undergoes I and D of abscesses please send for bacterial fungal and afb cultures  --I anticipate we will be giving [name] [name] another 6-8 week course of IV abx  Postoperatively and I will change to merrem and vancomycin for dosing convenience  --we will await recs from Dr. Kelly Splinter.  Dr Ninetta Lights is covering over the next 4 days of the Holidays.     LOS: 4 days   Acey Lav 05/08/2012, 1:25 PM

## 2012-05-08 NOTE — Progress Notes (Signed)
Pt is sick to stomach and vomiting. He does not want his CPAP at this time and will call RT when he feels he is ready to go on CPAP.

## 2012-05-09 ENCOUNTER — Inpatient Hospital Stay (HOSPITAL_COMMUNITY): Payer: Medicare Other

## 2012-05-09 LAB — BASIC METABOLIC PANEL
BUN: 11 mg/dL (ref 6–23)
Chloride: 107 mEq/L (ref 96–112)
Creatinine, Ser: 0.39 mg/dL — ABNORMAL LOW (ref 0.50–1.35)
GFR calc Af Amer: >90 mL/min (ref >90)
Glucose, Bld: 109 mg/dL — ABNORMAL HIGH (ref 70–99)

## 2012-05-09 LAB — CBC
HCT: 29 % — ABNORMAL LOW (ref 39.0–52.0)
MCH: 24 pg — ABNORMAL LOW (ref 26.0–34.0)
MCHC: 31 g/dL (ref 30.0–36.0)
MCV: 77.3 fL — ABNORMAL LOW (ref 78.0–100.0)
RDW: 19 % — ABNORMAL HIGH (ref 11.5–15.5)

## 2012-05-09 MED ORDER — ONDANSETRON HCL 4 MG/2ML IJ SOLN
4.0000 mg | Freq: Four times a day (QID) | INTRAMUSCULAR | Status: DC | PRN
  Administered 2012-05-09 – 2012-05-13 (×3): 4 mg via INTRAVENOUS
  Filled 2012-05-09 (×3): qty 2

## 2012-05-09 MED ORDER — ENOXAPARIN SODIUM 60 MG/0.6ML ~~LOC~~ SOLN
55.0000 mg | SUBCUTANEOUS | Status: DC
  Administered 2012-05-09 – 2012-05-13 (×5): 55 mg via SUBCUTANEOUS
  Filled 2012-05-09 (×6): qty 0.6

## 2012-05-09 MED ORDER — POTASSIUM CHLORIDE IN NACL 20-0.9 MEQ/L-% IV SOLN
INTRAVENOUS | Status: DC
  Administered 2012-05-09 – 2012-05-22 (×10): via INTRAVENOUS
  Filled 2012-05-09 (×24): qty 1000

## 2012-05-09 MED ORDER — PROMETHAZINE HCL 25 MG/ML IJ SOLN
12.5000 mg | Freq: Four times a day (QID) | INTRAMUSCULAR | Status: DC | PRN
  Administered 2012-05-09 – 2012-05-13 (×4): 12.5 mg via INTRAVENOUS
  Filled 2012-05-09 (×4): qty 1

## 2012-05-09 NOTE — Treatment Plan (Signed)
Pt continues to have projectile vomiting despite antimetics. Pt has vomited twice in the past two hrs.

## 2012-05-09 NOTE — Progress Notes (Signed)
Event: RN notified that pt has had at least 2 large amounts of emesis. Described dark brownish green and foul smelling. Zofran not helping nausea. Phenergan 12.5mg  ordered IV. NP to bedside. Subjective: Pt denies abd pain. Reports he is feeling better since phenergan. Reports he has had bowel obstructions in past.  Objective: Pt noted resting quietly w/o active vomiting. Abd is somewhat distended but soft and NT to palpation. BS hypoactive. Colostomy noted to be draining small amount soft stool. VSS. Afebrile. Assessment/Plan: 1. N/V: Currently resolved w/ IV phenergan. Nausea is not new though pt has not had vomiting until tonight. Abd exam w/o acute findings. Will defer decision for imaging (or not) to rounding MD this am. Will continue to monitor closely.  Leanne Chang, NP-C [facility] Pager [phone]

## 2012-05-09 NOTE — Progress Notes (Signed)
Have attempted to change dressing 3 different times today on my shift.Patient was off floor to radiology early morning.  Patient has been so nauseated and dry heaving. Medicated for nausea and still not able to do dressing change. Have informed next shift. Has only taken p med with sip of water one time.

## 2012-05-09 NOTE — Progress Notes (Signed)
[facility] PROGRESS NOTE  [name] [name] NWG:[id number] DOB: 09-Feb-1966 DOA: 05/04/2012 PCP: [name], MD  Assessment/Plan: Principal Problem:  *Sepsis Active Problems:  OSA (obstructive sleep apnea)  Obesity  Quadriplegia  Seizure disorder  HTN (hypertension)  Sacral decubitus ulcer, stage IV  Recurrent UTI    Brief summary.  46 year old quadriplegic African American male with history of chronic sacral diabetes ulcer stage IV and left knee wound, with diverting colostomy and chronic suprapubic catheter, who has had history of possible eye to send her current UTIs and soft tissue infection presents to the hospital with sepsis most likely due to infection around the sacral decubitus ulcer with osteomyelitis and possible abscess.  Asian follows with plastic surgeon Dr. Helyn Numbers, he was seen here by Dr. Rennis Chris orthopedic surgery who differed the case to Dr. Helyn Numbers, have discussed the case with Dr. Helyn Numbers who see the patient shortly. Patient is being seen by ID is on broad-spectrum antibiotic continue to monitor cultures. Currently nontoxic appearing.  1. Sepsis most likely due to infection from the chronic stage IV sacral decubitus ulcer and adjacent osteomyelitis with now multiple abscesses seen on MRI  Day # 6 of antbiotics  Day #6 vancomycin Day #6 imipenem  - Blood cultures are negative to date, wound cultures growing staph aureus along with some gram-negative rods, urine culture grew Pseudomonas, will monitor culture and sensitivity, appreciate ID input, continue vancomycin along with imipenem, discussed the case with orthopedic surgeon Dr. Rennis Chris who recommended that case be discussed with Dr. Helyn Numbers plastic surgeon as it was too complex for his capabilities, I called Dr. Helyn Numbers on [phone], she will see the patient shortly, Continue local wound care for his ulcers. Anticipate 6-8 weeks of antibiotics, awaiting Dr. Dwyane Luo recommendations  2. Quadriplegia - with  colostomy, suprapubic catheter which is changed the 2 months, urologist Dr. Isabel Caprice. No acute issues continue supportive care.  3. History of recurrent UTIs - UTI versus colonization, noted cultures, antibiotics for #1 above will suffice.  4. Hypertension - blood pressure stable continue present regimen , discontinue Lasix  5. History of seizures - no acute issues we'll monitor clinically. He is not on any anti-seizure medications at home.  6. Low potassium - replace and check magnesium  7.anemia status post transfusion of 2 units of packed red blood cells  8. projectile vomiting he does have colostomy output, we will get an abdominal KUB, keep patient n.p.o. until this resolves  Code Status: Full  Family Communication: Discussed with the patient  Disposition Plan: To the OR for debridement, LTAC rehabilitation    Procedures MRI  Consults ID, orthopedics case discussed with Dr. Rennis Chris @ [phone] on 05/06/2012 he recommended to discuss it with Dr. Helyn Numbers, called plastic surgeon Dr. Helyn Numbers on 05/06/2012 @ [phone]  DVT Prophylaxis Lovenox    HPI/Subjective: Pt continues to have projectile vomiting despite antimetics. Pt has vomited twice overnight   Objective: Filed Vitals:   05/08/12 0645 05/08/12 1314 05/08/12 2157 05/09/12 0605  BP: 145/78 115/57 157/79 149/81  Pulse: 75 74 78 79  Temp: 97.1 F (36.2 C) 97.7 F (36.5 C) 99.4 F (37.4 C) 98.4 F (36.9 C)  TempSrc:   Oral Oral  Resp: 18 20 20 18   Height:      Weight:      SpO2: 99% 99% 98% 98%    Intake/Output Summary (Last 24 hours) at 05/09/12 0272 Last data filed at 05/08/12 2354  Gross per 24 hour  Intake   2800 ml  Output  2351 ml  Net    449 ml    Exam:  Constitutional: He is oriented to person, place, and time.  Morbidly obese black male, quadriplegic in NAD. Sitting up working on his computer.  HENT:  Head: Normocephalic and atraumatic.  Nose: Nose normal.  Mouth/Throat: Oropharynx is clear and  moist.  Eyes: Conjunctivae normal and EOM are normal. Pupils are equal, round, and reactive to light.  Neck: Neck supple. No tracheal deviation present. No thyromegaly present.  Cardiovascular: Normal rate, regular rhythm, normal heart sounds and intact distal pulses.  Respiratory: Effort normal and breath sounds normal. No stridor. No respiratory distress. He has no wheezes. He has no rales. He exhibits no tenderness.  GI: Soft. Bowel sounds are normal.  Left colostomy in place.  Genitourinary:  Stage IV sacral decubitus and right ischial decubitus both with bone readily palpable in the base of the wound bed. There is necrotic tissue present and fibrous tissue in both wound beds. The peri-wound area has pink granulation present. There is minimal purulent drainage and odor presently, but hydrotherapy was just completed recently.  Musculoskeletal:  Quadriplegia with spasticity of extremities.  Bilateral LE edema, right greater than left side.  Lymphadenopathy:      Data Reviewed: Basic Metabolic Panel:  Lab 05/09/12 1610 05/08/12 0500 05/07/12 0520 05/06/12 0655 05/04/12 0436  NA 146* 141 139 135 131*  K 3.7 2.8* 3.2* 3.1* 3.8  CL 107 104 105 102 98  CO2 31 28 26 22 22   GLUCOSE 109* 116* 113* 104* 115*  BUN 11 10 7 7 17   CREATININE 0.39* 0.36* 0.38* 0.38* 0.41*  CALCIUM 9.2 8.8 8.6 8.5 9.1  MG -- 1.6 -- -- --  PHOS -- -- -- -- --    Liver Function Tests: No results found for this basename: AST:5,ALT:5,ALKPHOS:5,BILITOT:5,PROT:5,ALBUMIN:5 in the last 168 hours No results found for this basename: LIPASE:5,AMYLASE:5 in the last 168 hours No results found for this basename: AMMONIA:5 in the last 168 hours  CBC:  Lab 05/09/12 0530 05/08/12 0500 05/08/12 0430 05/07/12 0520 05/06/12 0655 05/04/12 0436  WBC 18.4* 16.8* -- 16.1* 14.6* 17.9*  NEUTROABS -- -- -- -- -- 12.5*  HGB 9.0* 8.6* 8.7* 6.7* 6.4* --  HCT 29.0* 28.4* 27.9* 22.2* 21.1* --  MCV 77.3* 77.2* -- 73.8* 73.5* 72.2*   PLT 535* 470* -- 503* 449* 468*    Cardiac Enzymes: No results found for this basename: CKTOTAL:5,CKMB:5,CKMBINDEX:5,TROPONINI:5 in the last 168 hours BNP (last 3 results) No results found for this basename: PROBNP:3 in the last 8760 hours   CBG: No results found for this basename: GLUCAP:5 in the last 168 hours  Recent Results (from the past 240 hour(s))  URINE CULTURE     Status: Normal   Collection Time   05/04/12  4:41 AM      Component Value Range Status Comment   Specimen Description URINE, CATHETERIZED   Final    Special Requests CX ADDED AT 0505 ON 122013   Final    Culture  Setup Time 05/04/2012 05:29   Final    Colony Count >=100,000 COLONIES/ML   Final    Culture     Final    Value: PSEUDOMONAS AERUGINOSA     YEAST   Report Status 05/06/2012 FINAL   Final    Organism ID, Bacteria PSEUDOMONAS AERUGINOSA   Final   WOUND CULTURE     Status: Normal   Collection Time   05/04/12  5:47 AM  Component Value Range Status Comment   Specimen Description WOUND BUTTOCK   Final    Special Requests Normal   Final    Gram Stain     Final    Value: MODERATE WBC PRESENT,BOTH PMN AND MONONUCLEAR     RARE SQUAMOUS EPITHELIAL CELLS PRESENT     FEW GRAM NEGATIVE RODS     RARE GRAM POSITIVE COCCI     IN PAIRS   Culture     Final    Value: FEW METHICILLIN RESISTANT STAPHYLOCOCCUS AUREUS     Note: RIFAMPIN AND GENTAMICIN SHOULD NOT BE USED AS SINGLE DRUGS FOR TREATMENT OF STAPH INFECTIONS. This organism is presumed to be Clindamycin resistant based on detection of inducible Clindamycin resistance. CRITICAL RESULT CALLED TO, READ BACK BY AND      VERIFIED WITH: [name] 05/07/12 1310 [name]   Report Status 05/07/2012 FINAL   Final    Organism ID, Bacteria METHICILLIN RESISTANT STAPHYLOCOCCUS AUREUS   Final   CULTURE, BLOOD (ROUTINE X 2)     Status: Normal (Preliminary result)   Collection Time   05/04/12  9:50 AM      Component Value Range Status Comment   Specimen  Description BLOOD HAND LEFT   Final    Special Requests BOTTLES DRAWN AEROBIC ONLY 10CC   Final    Culture  Setup Time 05/04/2012 20:22   Final    Culture     Final    Value:        BLOOD CULTURE RECEIVED NO GROWTH TO DATE CULTURE WILL BE HELD FOR 5 DAYS BEFORE ISSUING A FINAL NEGATIVE REPORT   Report Status PENDING   Incomplete   CULTURE, BLOOD (ROUTINE X 2)     Status: Normal (Preliminary result)   Collection Time   05/04/12 10:00 AM      Component Value Range Status Comment   Specimen Description BLOOD HAND LEFT   Final    Special Requests     Final    Value: BOTTLES DRAWN AEROBIC AND ANAEROBIC BLUE 10CC  RED 6CC   Culture  Setup Time 05/04/2012 15:27   Final    Culture     Final    Value:        BLOOD CULTURE RECEIVED NO GROWTH TO DATE CULTURE WILL BE HELD FOR 5 DAYS BEFORE ISSUING A FINAL NEGATIVE REPORT   Report Status PENDING   Incomplete   MRSA PCR SCREENING     Status: Normal   Collection Time   05/04/12  5:00 PM      Component Value Range Status Comment   MRSA by PCR NEGATIVE  NEGATIVE Final      Studies: Dg Chest 2 View  04/22/2012  *RADIOLOGY REPORT*  Clinical Data: Chest pain and weakness.  CHEST - 2 VIEW  Comparison: 03/17/2012 and chest CT 10/26/2010  Findings: Two views of the chest were obtained.  Again noted are old left rib fractures.  In addition, there are chronic densities in the right upper chest related to the right scapula and posterior ribs based on previous CT.  Limited evaluation of the lung bases due to patient positioning and body habitus.  Heart and mediastinum appear grossly stable.  Postsurgical changes in the lower cervical spine.  IMPRESSION: Limited examination without acute findings.  There are chronic densities in the right upper chest as described. Evidence of old left rib fractures.   Original Report Authenticated By: Richarda Overlie, M.D.    Mr Pelvis W Wo  Contrast  05/04/2012  *RADIOLOGY REPORT*  Clinical Data: Sacral decubitus ulcer with  foul-smelling discharge. History of osteomyelitis treated with antibiotics.  MRI PELVIS WITHOUT AND WITH CONTRAST  Technique:  Multiplanar multisequence MR imaging of the pelvis was performed both before and after administration of intravenous contrast.  Contrast: 20mL MULTIHANCE GADOBENATE DIMEGLUMINE 529 MG/ML IV SOLN  Comparison: Pelvic CTs 02/29/2012 and 01/18/2012.  Findings: There is chronic dislocation of the right hip and chronic extensive fragmentation of the right femoral head and neck.  There is extensive heterotopic ossification surrounding the dislocated femoral head.  There is a deep decubitus ulcer posterior to the dislocated proximal femur which extends to the bone as on the prior CT. There are multiple peripherally enhancing fluid collections associated with the dislocated femoral head and lateral to the proximal femur.  The latter are incompletely imaged.  There is diffuse surrounding synovial and muscular enhancement. Edema and enhancement track along the right iliacus muscle.  There is irregular sclerosis, edema and enhancement within the proximal right femur.  Similar involvement is present within the right iliac bone and acetabulum.  There is chronic deformity of the right ischium.  The sacroiliac joints are chronically fused without adjacent marrow edema.  There is chronic deformity of the proximal left femur with surrounding heterotopic ossification.  The left femur demonstrates no suspicious marrow signal or enhancement.  The patient has a descending colostomy and indwelling bladder catheter.  Multiple prominent pelvic and inguinal lymph nodes are present, likely reactive and similar to prior CT.  No intrapelvic fluid collections are identified.  IMPRESSION:  1.  Chronic dislocation of the right hip and fragmentation of the proximal right femur are similar to prior CT.  Comparison between modalities is limited. 2.  Sclerosis, edema and enhancement within the proximal femur and adjacent  acetabulum are consistent with chronic osteomyelitis. Superimposed acute osteomyelitis is likely. 3.  Extensive inflammatory changes surrounding the right hip with large irregular peripherally enhancing fluid collections, synovial enhancement and muscular enhancement adjacent to a large decubitus ulcer.  These findings are consistent with chronic soft tissue infection with abscess formation.   Original Report Authenticated By: Carey Bullocks, M.D.    Ir Fluoro Guide Cv Line Right  04/22/2012  *RADIOLOGY REPORT*  Clinical Data: 46 year old with sepsis.  PICC LINE PLACEMENT WITH ULTRASOUND AND FLUOROSCOPIC  GUIDANCE  Fluoroscopy Time: 1.1 minutes.  The right arm was prepped with chlorhexidine, draped in the usual sterile fashion using maximum barrier technique (cap and mask, sterile gown, sterile gloves, large sterile sheet, hand hygiene and cutaneous antisepsis) and infiltrated locally with 1% Lidocaine.  Ultrasound demonstrated patency of the right brachial vein, and this was documented with an image.  Under real-time ultrasound guidance, this vein was accessed with a 21 gauge micropuncture needle and image documentation was performed.  The needle was exchanged over a guidewire for a peel-away sheath through which a five [place] dual lumen PICC trimmed to 52 cm was advanced, positioned with its tip at the lower SVC/right atrial junction. Fluoroscopy during the procedure and fluoro spot radiograph confirms appropriate catheter position.  The catheter was flushed, secured to the skin with Prolene sutures, and covered with a sterile dressing.  Complications:  None  IMPRESSION: Successful right arm PICC line placement with ultrasound and fluoroscopic guidance.  The catheter is ready for use.   Original Report Authenticated By: Richarda Overlie, M.D.    Ir US Guide Vasc Access Right  04/22/2012  *RADIOLOGY REPORT*  Clinical Data: 46 year old with sepsis.  PICC LINE PLACEMENT WITH ULTRASOUND AND FLUOROSCOPIC  GUIDANCE   Fluoroscopy Time: 1.1 minutes.  The right arm was prepped with chlorhexidine, draped in the usual sterile fashion using maximum barrier technique (cap and mask, sterile gown, sterile gloves, large sterile sheet, hand hygiene and cutaneous antisepsis) and infiltrated locally with 1% Lidocaine.  Ultrasound demonstrated patency of the right brachial vein, and this was documented with an image.  Under real-time ultrasound guidance, this vein was accessed with a 21 gauge micropuncture needle and image documentation was performed.  The needle was exchanged over a guidewire for a peel-away sheath through which a five [place] dual lumen PICC trimmed to 52 cm was advanced, positioned with its tip at the lower SVC/right atrial junction. Fluoroscopy during the procedure and fluoro spot radiograph confirms appropriate catheter position.  The catheter was flushed, secured to the skin with Prolene sutures, and covered with a sterile dressing.  Complications:  None  IMPRESSION: Successful right arm PICC line placement with ultrasound and fluoroscopic guidance.  The catheter is ready for use.   Original Report Authenticated By: Richarda Overlie, M.D.    Dg Chest Portable 1 View  05/04/2012  *RADIOLOGY REPORT*  Clinical Data: Congestion, chills  PORTABLE CHEST - 1 VIEW  Comparison: 04/22/2012, 10/26/2010 CT  Findings: Prominent cardiomediastinal contours. Multiple small calcific densities projecting over the right upper lung and scapula, likely correspond to post-traumatic changes described on prior CT. Mild dependent atelectasis.  No pleural effusion or pneumothorax.  Prior left rib fractures again noted.  Mild rightward thoracic spine curvature may be exaggerated by positioning. Right PICC tip projects over the proximal SVC.  IMPRESSION: Chronic changes as above.  Cardiomediastinal prominence is similar to prior.   Original Report Authenticated By: Jearld Lesch, M.D.     Scheduled Meds:   . baclofen  20 mg Oral QID  .  docusate sodium  100 mg Oral BID  . enoxaparin (LOVENOX) injection  40 mg Subcutaneous Q24H  . famotidine  20 mg Oral BID  . ferrous sulfate  325 mg Oral TID WC  . lactose free nutrition  237 mL Oral BID BM  . meropenem (MERREM) IV  1 g Intravenous Q8H  . metoCLOPramide  10 mg Oral BID WC  . multivitamin with minerals  1 tablet Oral Daily  . nutrition supplement  1 packet Oral BID BM  . potassium chloride SA  20 mEq Oral Daily  . vancomycin  1,500 mg Intravenous Q12H  . vitamin C  500 mg Oral BID  . zinc sulfate  220 mg Oral BID   Continuous Infusions:   . 0.9 % NaCl with KCl 40 mEq / L 50 mL/hr at 05/09/12 0700    Principal Problem:  *Sepsis Active Problems:  OSA (obstructive sleep apnea)  Obesity  Quadriplegia  Seizure disorder  HTN (hypertension)  Sacral decubitus ulcer, stage IV  Recurrent UTI    Time spent: 40 minutes   [facility]  [facility] Pager [phone]. If 8PM-8AM, please contact night-coverage at [email or web address], password [facility] 05/09/2012, 8:32 AM  LOS: 5 days

## 2012-05-09 NOTE — H&P (Signed)
Please be advised that pt experienced several episodes of projectile vomiting. Emesis was dark brown with a greenish tinge. Pt's abd distended and firm prior to vomiting. Vomiting started around 21:30 and Natalia Leatherwood, NP was made aware.  A one time order for Zofran was initially given. However, due to continued vomiting an ongoing prn order for Zofran and Phenergan was given which helped to decrease vomiting.
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
## 2012-05-09 NOTE — Progress Notes (Signed)
Patient has just had a NG tube placed.  Patient states that he does not want to wear CPAP tonight with NG tube in place.

## 2012-05-10 ENCOUNTER — Inpatient Hospital Stay (HOSPITAL_COMMUNITY): Payer: Medicare Other

## 2012-05-10 LAB — CULTURE, BLOOD (ROUTINE X 2): Culture: NO GROWTH

## 2012-05-10 LAB — BASIC METABOLIC PANEL
BUN: 9 mg/dL (ref 6–23)
CO2: 33 mEq/L — ABNORMAL HIGH (ref 19–32)
Chloride: 105 mEq/L (ref 96–112)
Creatinine, Ser: 0.41 mg/dL — ABNORMAL LOW (ref 0.50–1.35)
GFR calc Af Amer: >90 mL/min (ref >90)
Potassium: 3.5 mEq/L (ref 3.5–5.1)

## 2012-05-10 LAB — CBC
HCT: 28.3 % — ABNORMAL LOW (ref 39.0–52.0)
Hemoglobin: 8.7 g/dL — ABNORMAL LOW (ref 13.0–17.0)
MCV: 78 fL (ref 78.0–100.0)
WBC: 16.5 10*3/uL — ABNORMAL HIGH (ref 4.0–10.5)

## 2012-05-10 MED ORDER — MAGNESIUM SULFATE 40 MG/ML IJ SOLN
4.0000 g | Freq: Once | INTRAMUSCULAR | Status: AC
  Administered 2012-05-10: 4 g via INTRAVENOUS
  Filled 2012-05-10: qty 100

## 2012-05-10 NOTE — Progress Notes (Signed)
Pt not ready for dressing change,informed RN that he will call when ready.

## 2012-05-10 NOTE — Progress Notes (Signed)
[facility] PROGRESS NOTE  [name] [name] BMW:[id number] DOB: 07/15/1965 DOA: 05/04/2012 PCP: [name], MD  Assessment/Plan: Principal Problem:  *Sepsis Active Problems:  OSA (obstructive sleep apnea)  Obesity  Quadriplegia  Seizure disorder  HTN (hypertension)  Sacral decubitus ulcer, stage IV  Recurrent UTI  46 year old quadriplegic African American male with history of chronic sacral diabetes ulcer stage IV and left knee wound, with diverting colostomy and chronic suprapubic catheter, who has had history of possible eye to send her current UTIs and soft tissue infection presents to the hospital with sepsis most likely due to infection around the sacral decubitus ulcer with osteomyelitis and possible abscess.  Asian follows with plastic surgeon Dr. Helyn Numbers, he was seen here by Dr. Rennis Chris orthopedic surgery who differed the case to Dr. Helyn Numbers, have discussed the case with Dr. Helyn Numbers who see the patient shortly. Patient is being seen by ID is on broad-spectrum antibiotic continue to monitor cultures. Currently nontoxic appearing.  1. Sepsis most likely due to infection from the chronic stage IV sacral decubitus ulcer and adjacent osteomyelitis with now multiple abscesses seen on MRI  Day # 7 of antbiotics  Day #7 vancomycin Day #7 imipenem  - Blood cultures are negative to date, wound cultures growing staph aureus along with some gram-negative rods, urine culture grew Pseudomonas, will monitor culture and sensitivity, appreciate ID input, continue vancomycin along with imipenem, discussed the case with orthopedic surgeon Dr. Rennis Chris who recommended that case be discussed with Dr. Helyn Numbers plastic surgeon as it was too complex for his capabilities, I called Dr. Helyn Numbers on [phone], [phone] . she will see the patient shortly, Continue local wound care for his ulcers. Anticipate 6-8 weeks of antibiotics, awaiting Dr. Dwyane Luo recommendations. Her PA saw the patient on 12/24   2.  Quadriplegia - with colostomy, suprapubic catheter which is changed the 2 months, urologist Dr. Isabel Caprice. No acute issues continue supportive care.  3. History of recurrent UTIs - UTI versus colonization, noted cultures, antibiotics for #1 above will suffice.  4. Hypertension - blood pressure stable continue present regimen , discontinue Lasix  5. History of seizures - no acute issues we'll monitor clinically. He is not on any anti-seizure medications at home.  6. Low potassium - replace and check magnesium  7.anemia status post transfusion of 2 units of packed red blood cells  8. projectile vomiting he does have colostomy output, times one yesterday, abdominal KUB showed,Massive gastric gaseous distention which could reflect gastric outlet obstruction, gastroparesis, NG tube was placed, nausea improved,. We'll clamp NG tube and start sips of clear liquid diet  Code Status: Full  Family Communication: Discussed with the patient  Disposition Plan: To the OR for debridement, LTAC rehabilitation    Procedures MRI  Consults ID, orthopedics case discussed with Dr. Rennis Chris @ [phone] on 05/06/2012 he recommended to discuss it with Dr. Helyn Numbers, called plastic surgeon Dr. Helyn Numbers on 05/06/2012 @ [phone]  DVT Prophylaxis Lovenox   HPI/Subjective: Nausea improved  Objective: Filed Vitals:   05/09/12 0605 05/09/12 1430 05/09/12 2140 05/10/12 0619  BP: 149/81 151/87 119/62 143/85  Pulse: 79 68 95 97  Temp: 98.4 F (36.9 C) 97.9 F (36.6 C) 98.3 F (36.8 C) 98.7 F (37.1 C)  TempSrc: Oral Oral Oral Oral  Resp: 18 20 20 18   Height:      Weight:      SpO2: 98% 96% 95% 95%    Intake/Output Summary (Last 24 hours) at 05/10/12 0816 Last data filed at 05/10/12 [phone]  Gross per 24 hour  Intake 1824.67 ml  Output   2951 ml  Net -1126.33 ml    Exam:  Constitutional: He is oriented to person, place, and time.  Morbidly obese black male, quadriplegic in NAD. Sitting up working on his  computer.  HENT:  Head: Normocephalic and atraumatic.  Nose: Nose normal.  Mouth/Throat: Oropharynx is clear and moist.  Eyes: Conjunctivae normal and EOM are normal. Pupils are equal, round, and reactive to light.  Neck: Neck supple. No tracheal deviation present. No thyromegaly present.  Cardiovascular: Normal rate, regular rhythm, normal heart sounds and intact distal pulses.  Respiratory: Effort normal and breath sounds normal. No stridor. No respiratory distress. He has no wheezes. He has no rales. He exhibits no tenderness.  GI: Soft. Bowel sounds are normal.  Left colostomy in place.  Genitourinary:  Stage IV sacral decubitus and right ischial decubitus both with bone readily palpable in the base of the wound bed. There is necrotic tissue present and fibrous tissue in both wound beds. The peri-wound area has pink granulation present. There is minimal purulent drainage and odor presently, but hydrotherapy was just completed recently.  Musculoskeletal:  Quadriplegia with spasticity of extremities.  Bilateral LE edema, right greater than left side.  Lymphadenopathy:      Data Reviewed: Basic Metabolic Panel:  Lab 05/10/12 1610 05/09/12 0530 05/08/12 0500 05/07/12 0520 05/06/12 0655  NA 145 146* 141 139 135  K 3.5 3.7 2.8* 3.2* 3.1*  CL 105 107 104 105 102  CO2 33* 31 28 26 22   GLUCOSE 101* 109* 116* 113* 104*  BUN 9 11 10 7 7   CREATININE 0.41* 0.39* 0.36* 0.38* 0.38*  CALCIUM 9.2 9.2 8.8 8.6 8.5  MG -- -- 1.6 -- --  PHOS -- -- -- -- --    Liver Function Tests: No results found for this basename: AST:5,ALT:5,ALKPHOS:5,BILITOT:5,PROT:5,ALBUMIN:5 in the last 168 hours No results found for this basename: LIPASE:5,AMYLASE:5 in the last 168 hours No results found for this basename: AMMONIA:5 in the last 168 hours  CBC:  Lab 05/10/12 0535 05/09/12 0530 05/08/12 0500 05/08/12 0430 05/07/12 0520 05/06/12 0655 05/04/12 0436  WBC 16.5* 18.4* 16.8* -- 16.1* 14.6* --  NEUTROABS  -- -- -- -- -- -- 12.5*  HGB 8.7* 9.0* 8.6* 8.7* 6.7* -- --  HCT 28.3* 29.0* 28.4* 27.9* 22.2* -- --  MCV 78.0 77.3* 77.2* -- 73.8* 73.5* --  PLT 538* 535* 470* -- 503* 449* --    Cardiac Enzymes: No results found for this basename: CKTOTAL:5,CKMB:5,CKMBINDEX:5,TROPONINI:5 in the last 168 hours BNP (last 3 results) No results found for this basename: PROBNP:3 in the last 8760 hours   CBG: No results found for this basename: GLUCAP:5 in the last 168 hours  Recent Results (from the past 240 hour(s))  URINE CULTURE     Status: Normal   Collection Time   05/04/12  4:41 AM      Component Value Range Status Comment   Specimen Description URINE, CATHETERIZED   Final    Special Requests CX ADDED AT 0505 ON 122013   Final    Culture  Setup Time 05/04/2012 05:29   Final    Colony Count >=100,000 COLONIES/ML   Final    Culture     Final    Value: PSEUDOMONAS AERUGINOSA     YEAST   Report Status 05/06/2012 FINAL   Final    Organism ID, Bacteria PSEUDOMONAS AERUGINOSA   Final   WOUND CULTURE  Status: Normal   Collection Time   05/04/12  5:47 AM      Component Value Range Status Comment   Specimen Description WOUND BUTTOCK   Final    Special Requests Normal   Final    Gram Stain     Final    Value: MODERATE WBC PRESENT,BOTH PMN AND MONONUCLEAR     RARE SQUAMOUS EPITHELIAL CELLS PRESENT     FEW GRAM NEGATIVE RODS     RARE GRAM POSITIVE COCCI     IN PAIRS   Culture     Final    Value: FEW METHICILLIN RESISTANT STAPHYLOCOCCUS AUREUS     Note: RIFAMPIN AND GENTAMICIN SHOULD NOT BE USED AS SINGLE DRUGS FOR TREATMENT OF STAPH INFECTIONS. This organism is presumed to be Clindamycin resistant based on detection of inducible Clindamycin resistance. CRITICAL RESULT CALLED TO, READ BACK BY AND      VERIFIED WITH: [name] 05/07/12 1310 [name]   Report Status 05/07/2012 FINAL   Final    Organism ID, Bacteria METHICILLIN RESISTANT STAPHYLOCOCCUS AUREUS   Final   CULTURE, BLOOD  (ROUTINE X 2)     Status: Normal (Preliminary result)   Collection Time   05/04/12  9:50 AM      Component Value Range Status Comment   Specimen Description BLOOD HAND LEFT   Final    Special Requests BOTTLES DRAWN AEROBIC ONLY 10CC   Final    Culture  Setup Time 05/04/2012 20:22   Final    Culture     Final    Value:        BLOOD CULTURE RECEIVED NO GROWTH TO DATE CULTURE WILL BE HELD FOR 5 DAYS BEFORE ISSUING A FINAL NEGATIVE REPORT   Report Status PENDING   Incomplete   CULTURE, BLOOD (ROUTINE X 2)     Status: Normal (Preliminary result)   Collection Time   05/04/12 10:00 AM      Component Value Range Status Comment   Specimen Description BLOOD HAND LEFT   Final    Special Requests     Final    Value: BOTTLES DRAWN AEROBIC AND ANAEROBIC BLUE 10CC  RED 6CC   Culture  Setup Time 05/04/2012 15:27   Final    Culture     Final    Value:        BLOOD CULTURE RECEIVED NO GROWTH TO DATE CULTURE WILL BE HELD FOR 5 DAYS BEFORE ISSUING A FINAL NEGATIVE REPORT   Report Status PENDING   Incomplete   MRSA PCR SCREENING     Status: Normal   Collection Time   05/04/12  5:00 PM      Component Value Range Status Comment   MRSA by PCR NEGATIVE  NEGATIVE Final      Studies: Dg Chest 2 View  04/22/2012  *RADIOLOGY REPORT*  Clinical Data: Chest pain and weakness.  CHEST - 2 VIEW  Comparison: 03/17/2012 and chest CT 10/26/2010  Findings: Two views of the chest were obtained.  Again noted are old left rib fractures.  In addition, there are chronic densities in the right upper chest related to the right scapula and posterior ribs based on previous CT.  Limited evaluation of the lung bases due to patient positioning and body habitus.  Heart and mediastinum appear grossly stable.  Postsurgical changes in the lower cervical spine.  IMPRESSION: Limited examination without acute findings.  There are chronic densities in the right upper chest as described. Evidence of old left rib  fractures.   Original Report  Authenticated By: Richarda Overlie, M.D.    Dg Abd 1 View  05/10/2012  *RADIOLOGY REPORT*  Clinical Data: Nausea  ABDOMEN - 1 VIEW  Comparison: Yesterday  Findings: NG tube placed.  Gastric distention has resolved.  No disproportionate dilatation of bowel. 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  IMPRESSION: Gastric distention resolved after NG tube placement.   Original Report Authenticated By: Jolaine Click, M.D.    Dg Abd 1 View  05/09/2012  *RADIOLOGY REPORT*  Clinical Data: Vomiting  ABDOMEN - 1 VIEW  Comparison: CT 02/29/2012  Findings: There is massive gaseous distention of the stomach to the approximate level of the duodenal bulb.  Remote fracture deformity with probable neuropathic change proximal right femur and pelvis, and changed.  No small or large bowel dilatation.  No free air accounting for supine technique.  IMPRESSION: Massive gastric gaseous distention which could reflect gastric outlet obstruction, gastroparesis, or ingested air less likely.   Original Report Authenticated By: Christiana Pellant, M.D.    Mr Pelvis W Wo Contrast  05/04/2012  *RADIOLOGY REPORT*  Clinical Data: Sacral decubitus ulcer with foul-smelling discharge. History of osteomyelitis treated with antibiotics.  MRI PELVIS WITHOUT AND WITH CONTRAST  Technique:  Multiplanar multisequence MR imaging of the pelvis was performed both before and after administration of intravenous contrast.  Contrast: 20mL MULTIHANCE GADOBENATE DIMEGLUMINE 529 MG/ML IV SOLN  Comparison: Pelvic CTs 02/29/2012 and 01/18/2012.  Findings: There is chronic dislocation of the right hip and chronic extensive fragmentation of the right femoral head and neck.  There is extensive heterotopic ossification surrounding the dislocated femoral head.  There is a deep decubitus ulcer posterior to the dislocated proximal femur which extends to the bone as on the prior CT. There are  multiple peripherally enhancing fluid collections associated with the dislocated femoral head and lateral to the proximal femur.  The latter are incompletely imaged.  There is diffuse surrounding synovial and muscular enhancement. Edema and enhancement track along the right iliacus muscle.  There is irregular sclerosis, edema and enhancement within the proximal right femur.  Similar involvement is present within the right iliac bone and acetabulum.  There is chronic deformity of the right ischium.  The sacroiliac joints are chronically fused without adjacent marrow edema.  There is chronic deformity of the proximal left femur with surrounding heterotopic ossification.  The left femur demonstrates no suspicious marrow signal or enhancement.  The patient has a descending colostomy and indwelling bladder catheter.  Multiple prominent pelvic and inguinal lymph nodes are present, likely reactive and similar to prior CT.  No intrapelvic fluid collections are identified.  IMPRESSION:  1.  Chronic dislocation of the right hip and fragmentation of the proximal right femur are similar to prior CT.  Comparison between modalities is limited. 2.  Sclerosis, edema and enhancement within the proximal femur and adjacent acetabulum are consistent with chronic osteomyelitis. Superimposed acute osteomyelitis is likely. 3.  Extensive inflammatory changes surrounding the right hip with large irregular peripherally enhancing fluid collections, synovial enhancement and muscular enhancement adjacent to a large decubitus ulcer.  These findings are consistent with chronic soft tissue infection with abscess formation.   Original Report Authenticated By: Carey Bullocks, M.D.    Ir Fluoro Guide Cv Line Right  04/22/2012  *RADIOLOGY REPORT*  Clinical Data: 46 year old with sepsis.  PICC LINE PLACEMENT WITH ULTRASOUND AND FLUOROSCOPIC  GUIDANCE  Fluoroscopy Time: 1.1  minutes.  The right arm was prepped with chlorhexidine, draped in the usual  sterile fashion using maximum barrier technique (cap and mask, sterile gown, sterile gloves, large sterile sheet, hand hygiene and cutaneous antisepsis) and infiltrated locally with 1% Lidocaine.  Ultrasound demonstrated patency of the right brachial vein, and this was documented with an image.  Under real-time ultrasound guidance, this vein was accessed with a 21 gauge micropuncture needle and image documentation was performed.  The needle was exchanged over a guidewire for a peel-away sheath through which a five [place] dual lumen PICC trimmed to 52 cm was advanced, positioned with its tip at the lower SVC/right atrial junction. Fluoroscopy during the procedure and fluoro spot radiograph confirms appropriate catheter position.  The catheter was flushed, secured to the skin with Prolene sutures, and covered with a sterile dressing.  Complications:  None  IMPRESSION: Successful right arm PICC line placement with ultrasound and fluoroscopic guidance.  The catheter is ready for use.   Original Report Authenticated By: Richarda Overlie, M.D.    Ir US Guide Vasc Access Right  04/22/2012  *RADIOLOGY REPORT*  Clinical Data: 46 year old with sepsis.  PICC LINE PLACEMENT WITH ULTRASOUND AND FLUOROSCOPIC  GUIDANCE  Fluoroscopy Time: 1.1 minutes.  The right arm was prepped with chlorhexidine, draped in the usual sterile fashion using maximum barrier technique (cap and mask, sterile gown, sterile gloves, large sterile sheet, hand hygiene and cutaneous antisepsis) and infiltrated locally with 1% Lidocaine.  Ultrasound demonstrated patency of the right brachial vein, and this was documented with an image.  Under real-time ultrasound guidance, this vein was accessed with a 21 gauge micropuncture needle and image documentation was performed.  The needle was exchanged over a guidewire for a peel-away sheath through which a five [place] dual lumen PICC trimmed to 52 cm was advanced, positioned with its tip at the lower SVC/right atrial  junction. Fluoroscopy during the procedure and fluoro spot radiograph confirms appropriate catheter position.  The catheter was flushed, secured to the skin with Prolene sutures, and covered with a sterile dressing.  Complications:  None  IMPRESSION: Successful right arm PICC line placement with ultrasound and fluoroscopic guidance.  The catheter is ready for use.   Original Report Authenticated By: Richarda Overlie, M.D.    Dg Chest Portable 1 View  05/04/2012  *RADIOLOGY REPORT*  Clinical Data: Congestion, chills  PORTABLE CHEST - 1 VIEW  Comparison: 04/22/2012, 10/26/2010 CT  Findings: Prominent cardiomediastinal contours. Multiple small calcific densities projecting over the right upper lung and scapula, likely correspond to post-traumatic changes described on prior CT. Mild dependent atelectasis.  No pleural effusion or pneumothorax.  Prior left rib fractures again noted.  Mild rightward thoracic spine curvature may be exaggerated by positioning. Right PICC tip projects over the proximal SVC.  IMPRESSION: Chronic changes as above.  Cardiomediastinal prominence is similar to prior.   Original Report Authenticated By: Jearld Lesch, M.D.     Scheduled Meds:   . baclofen  20 mg Oral QID  . docusate sodium  100 mg Oral BID  . enoxaparin (LOVENOX) injection  55 mg Subcutaneous Q24H  . famotidine  20 mg Oral BID  . lactose free nutrition  237 mL Oral BID BM  . meropenem (MERREM) IV  1 g Intravenous Q8H  . metoCLOPramide  10 mg Oral BID WC  . nutrition supplement  1 packet Oral BID BM  . potassium chloride SA  20 mEq Oral Daily  . vancomycin  1,500 mg Intravenous Q12H  Continuous Infusions:   . 0.9 % NaCl with KCl 20 mEq / L 100 mL/hr at 05/10/12 0141    Principal Problem:  *Sepsis Active Problems:  OSA (obstructive sleep apnea)  Obesity  Quadriplegia  Seizure disorder  HTN (hypertension)  Sacral decubitus ulcer, stage IV  Recurrent UTI    Time spent: 40  minutes   [facility] : [facility]  [facility] Pager [phone]. If 8PM-8AM, please contact night-coverage at [email or web address], password [facility] 05/10/2012, 8:16 AM  LOS: 6 days

## 2012-05-10 NOTE — Progress Notes (Signed)
RT note: Pt refusing cpap tonight due to NG tube. RT will continue to monitor

## 2012-05-10 NOTE — Progress Notes (Signed)
PT HYDROTHERAPY PROGRESS NOTE   05/10/12 1300  Subjective Assessment  Subjective Pt agreeable to hydrotherapy  Patient and Family Stated Goals heal wounds  Date of Onset (prior to admission)  Prior Treatments Previous wound care; hydro therapy; dressing changes with home health  Evaluation and Treatment  Evaluation and Treatment Procedures Explained to Patient/Family Yes  Evaluation and Treatment Procedures agreed to  Pressure Ulcer 04/22/12 Stage II -  Partial thickness loss of dermis presenting as a shallow open ulcer with a red, pink wound bed without slough.  Final Assessment Date/Final Assessment Time: 04/26/12 1608  Date First Assessed/Time First Assessed: 04/22/12 1445   Location: Knee  Location Orientation: Left  Staging: Stage II -  Partial thickness loss of dermis presenting as a shallow open ulcer with  Dressing Type ABD  Dressing Clean;Dry;Intact  Pressure Ulcer 05/04/12 Stage III -  Full thickness tissue loss. Subcutaneous fat may be visible but bone, tendon or muscle are NOT exposed. wound from laptap sitting on knee  Date First Assessed/Time First Assessed: 05/04/12 1730   Location: Knee  Location Orientation: Left  Staging: Stage III -  Full thickness tissue loss. Subcutaneous fat may be visible but bone, tendon or muscle are NOT exposed.  Wound Description (Comment  Dressing Type Gauze (Comment)  Dressing Changed;Clean;Intact  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  Date First Assessed/Time First Assessed: 05/04/12 2030   Location: Buttocks  Location Orientation: Right  Staging: Stage IV - Full thickness tissue loss with exposed bone, tendon or muscle.  Present on Admission: Yes  State of Healing Early/partial granulation  Site / Wound Assessment Pink;Red;Bleeding  % Wound base Red or Granulating 65%  % Wound base Yellow 25%  % Wound base Black 0%  % Wound base Other (Comment) 5% (exposed tendon and floating bone)   Peri-wound Assessment Edema;Erythema (blanchable);Maceration;Pink  Margins Unattacted edges (unapproximated)  Drainage Amount Moderate  Drainage Description Serosanguineous;Purulent;Green;Odor  Treatment Hydrotherapy (Pulse lavage);Packing (Saline gauze)  Dressing Type ABD;Barrier Film (skin prep);Gauze (Comment);Moist to dry;Tape dressing  Dressing Clean;Dry;Intact  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  Date First Assessed/Time First Assessed: 05/04/12 2030   Location: Sacrum  Location Orientation: Medial  Staging: Stage IV - Full thickness tissue loss with exposed bone, tendon or muscle.  Present on Admission: Yes  Dressing Type Gauze (Comment);Moist to dry  Dressing Clean;Intact  Hydrotherapy  Pulsed lavage therapy - wound location Right Ischial Wound  Pulsed Lavage with Suction (psi) 4 psi  Pulsed Lavage with Suction - Normal Saline Used 1000 mL  Pulsed Lavage Tip Tip with splash shield  Selective Debridement  Selective Debridement - Location Right Ischial tuberosity wound margins  Selective Debridement - Tools Used Scissors;Forceps  Selective Debridement - Tissue Removed slough and maceratic necrotic tissue  Wound Therapy - Assess/Plan/Recommendations  Wound Therapy - Clinical Statement Pt presents with stage 4 pressure wounds to his sacrum and buttocks.  Wound is filled with copious amounts of purulent drainage and foul odor today despite hydrotherapy and dressing changes yesterday. Fragments of floating bone/calcium emmersed in wound bed.  Wound margins are significant in depth with advance tunneling and undermining.  Will contine to provide hydrotherapy to address necrotic tissue and facilitate wound healing.  Wound Therapy - Functional Problem List decreased skin integrity secondary to immobility  Factors Delaying/Impairing Wound Healing Altered sensation;Diabetes Mellitus;Infection - systemic/local;Immobility;Multiple  medical problems;Vascular compromise  Hydrotherapy Plan  Debridement;Dressing change;Patient/family education;Pulsatile lavage with suction  Wound Therapy - Frequency 6X / week  Wound Therapy - Follow Up Recommendations Home health RN  Wound Plan Continue hydrotherapy as indicated  Wound Therapy Goals - Improve the function of patient's integumentary system by progressing the wound(s) through the phases of wound healing by:  Decrease Necrotic Tissue to 10%  Decrease Necrotic Tissue - Progress Progressing toward goal  Increase Granulation Tissue to 90%  Increase Granulation Tissue - Progress Progressing toward goal  Goals/treatment plan/discharge plan were made with and agreed upon by patient/family Yes  Time For Goal Achievement 7 days  Wound Therapy - Potential for Goals Fair     Charlotte Crumb, PT DPT  [phone]

## 2012-05-11 ENCOUNTER — Inpatient Hospital Stay (HOSPITAL_COMMUNITY): Payer: Medicare Other

## 2012-05-11 LAB — BASIC METABOLIC PANEL
BUN: 7 mg/dL (ref 6–23)
CO2: 31 mEq/L (ref 19–32)
Glucose, Bld: 95 mg/dL (ref 70–99)
Potassium: 3.6 mEq/L (ref 3.5–5.1)
Sodium: 141 mEq/L (ref 135–145)

## 2012-05-11 NOTE — Progress Notes (Signed)
Spoke with [name] [name] at bedside to discuss [facility] Care Management services. States he plans to return home with his caregiver and will have [facility] services. Explained that he will receive a post transition of care call and be evaluated for monthly home visits. [facility] Care Management servies do not replace any home health services that have been arranged. Left contact information along with packet at patient bedside. Confirmed patient's address and correct phone numbers.  [name], MSN- Ed, RN,BSN [facility] Liaison [phone]

## 2012-05-11 NOTE — Progress Notes (Signed)
[facility]  Patient Status: Active (receiving services up to time of hospitalization)  [facility] is providing the following services: RN and Home Infusion Services (teaching and education will be done by nurse in the home with patient and caregiver) Stop date on home IV antibiotic cefepime was 12/25.  If patient discharges after hours, please call [phone].   [name] [name] 05/11/2012, 11:55 AM

## 2012-05-11 NOTE — Consult Note (Signed)
Agree with the above information and will work on getting him on the OR schedule.

## 2012-05-11 NOTE — Progress Notes (Signed)
[facility] PROGRESS NOTE  Kit Mollett ZOX:[id number] DOB: 06-01-1965 DOA: 05/04/2012 PCP: [name], MD  Assessment/Plan: Principal Problem:  *Sepsis Active Problems:  OSA (obstructive sleep apnea)  Obesity  Quadriplegia  Seizure disorder  HTN (hypertension)  Sacral decubitus ulcer, stage IV  Recurrent UTI    46 year old quadriplegic African American male with history of chronic sacral diabetes ulcer stage IV and left knee wound, with diverting colostomy and chronic suprapubic catheter, who has had history of possible eye to send her current UTIs and soft tissue infection presents to the hospital with sepsis most likely due to infection around the sacral decubitus ulcer with osteomyelitis and possible abscess.  Asian follows with plastic surgeon Dr. Helyn Numbers, he was seen here by Dr. Rennis Chris orthopedic surgery who differed the case to Dr. Helyn Numbers, have discussed the case with Dr. Helyn Numbers who see the patient shortly. Patient is being seen by ID is on broad-spectrum antibiotic continue to monitor cultures. Currently nontoxic appearing.  1. Sepsis most likely due to infection from the chronic stage IV sacral decubitus ulcer and adjacent osteomyelitis with now multiple abscesses seen on MRI  Day # 8 of antbiotics  Day #8 vancomycin Day #8 imipenem  - Blood cultures are negative to date, wound cultures growing staph aureus along with some gram-negative rods, urine culture grew Pseudomonas, will monitor culture and sensitivity, appreciate ID input, continue vancomycin along with imipenem, discussed the case with orthopedic surgeon Dr. Rennis Chris who recommended that case be discussed with Dr. Helyn Numbers plastic surgeon as it was too complex for his capabilities, I called Dr. Helyn Numbers on [phone], [phone] . she will see the patient shortly, Continue local wound care for his ulcers. Anticipate 6-8 weeks of antibiotics, awaiting Dr. Dwyane Luo recommendations. Most likely she will operate next  Monday. Her PA saw the patient on 12/24  2. Quadriplegia - with colostomy, suprapubic catheter which is changed the 2 months, urologist Dr. Isabel Caprice. No acute issues continue supportive care.  3. History of recurrent UTIs - UTI versus colonization, noted cultures, antibiotics for #1 above will suffice.  4. Hypertension - blood pressure stable continue present regimen , discontinue Lasix  5. History of seizures - no acute issues we'll monitor clinically. He is not on any anti-seizure medications at home.  6. Low potassium - replace and check magnesium  7.anemia status post transfusion of 2 units of packed red blood cells  8. projectile vomiting/ileus he does have colostomy output, times one yesterday, abdominal KUB showed,Massive gastric gaseous distention which could reflect gastric outlet obstruction, gastroparesis, NG tube was placed, and clamped yesterday. nausea improved,. Advance to full liquid diet.   Code Status: Full  Family Communication: Discussed with the patient  Disposition Plan: To the OR for debridement next Monday, LTAC rehabilitation    Procedures MRI  Consults ID, orthopedics case discussed with Dr. Rennis Chris @ [phone] on 05/06/2012 he recommended to discuss it with Dr. Helyn Numbers, called plastic surgeon Dr. Helyn Numbers on 05/06/2012 @ [phone]  DVT Prophylaxis Lovenox    HPI/Subjective:  Nausea improved   Objective: Filed Vitals:   05/10/12 0619 05/10/12 1411 05/10/12 2106 05/11/12 0528  BP: 143/85 141/89 140/83 149/83  Pulse: 97 85 94 93  Temp: 98.7 F (37.1 C) 98.2 F (36.8 C) 98.2 F (36.8 C) 98.6 F (37 C)  TempSrc: Oral Oral Oral Oral  Resp: 18 20 16 18   Height:      Weight:      SpO2: 95% 100% 96% 93%    Intake/Output Summary (Last 24  hours) at 05/11/12 0828 Last data filed at 05/11/12 0612  Gross per 24 hour  Intake   2606 ml  Output   2350 ml  Net    256 ml    Exam:  HENT:  Head: Atraumatic.  Nose: Nose normal.  Mouth/Throat: Oropharynx is  clear and moist.  Eyes: Conjunctivae are normal. Pupils are equal, round, and reactive to light. No scleral icterus.  Neck: Neck supple. No tracheal deviation present.  Cardiovascular: Normal rate, regular rhythm, normal heart sounds and intact distal pulses.  Pulmonary/Chest: Effort normal and breath sounds normal. No respiratory distress.  Abdominal: Soft. Normal appearance and bowel sounds are normal. She exhibits no distension. There is no tenderness.  Musculoskeletal: She exhibits no edema and no tenderness.  Neurological: She is alert. No cranial nerve deficit.    Data Reviewed: Basic Metabolic Panel:  Lab 05/11/12 2956 05/10/12 0535 05/09/12 0530 05/08/12 0500 05/07/12 0520  NA 141 145 146* 141 139  K 3.6 3.5 3.7 2.8* 3.2*  CL 103 105 107 104 105  CO2 31 33* 31 28 26   GLUCOSE 95 101* 109* 116* 113*  BUN 7 9 11 10 7   CREATININE 0.36* 0.41* 0.39* 0.36* 0.38*  CALCIUM 8.8 9.2 9.2 8.8 8.6  MG 2.1 -- -- 1.6 --  PHOS -- -- -- -- --    Liver Function Tests: No results found for this basename: AST:5,ALT:5,ALKPHOS:5,BILITOT:5,PROT:5,ALBUMIN:5 in the last 168 hours No results found for this basename: LIPASE:5,AMYLASE:5 in the last 168 hours No results found for this basename: AMMONIA:5 in the last 168 hours  CBC:  Lab 05/10/12 0535 05/09/12 0530 05/08/12 0500 05/08/12 0430 05/07/12 0520 05/06/12 0655  WBC 16.5* 18.4* 16.8* -- 16.1* 14.6*  NEUTROABS -- -- -- -- -- --  HGB 8.7* 9.0* 8.6* 8.7* 6.7* --  HCT 28.3* 29.0* 28.4* 27.9* 22.2* --  MCV 78.0 77.3* 77.2* -- 73.8* 73.5*  PLT 538* 535* 470* -- 503* 449*    Cardiac Enzymes: No results found for this basename: CKTOTAL:5,CKMB:5,CKMBINDEX:5,TROPONINI:5 in the last 168 hours BNP (last 3 results) No results found for this basename: PROBNP:3 in the last 8760 hours   CBG: No results found for this basename: GLUCAP:5 in the last 168 hours  Recent Results (from the past 240 hour(s))  URINE CULTURE     Status: Normal    Collection Time   05/04/12  4:41 AM      Component Value Range Status Comment   Specimen Description URINE, CATHETERIZED   Final    Special Requests CX ADDED AT 0505 ON 122013   Final    Culture  Setup Time 05/04/2012 05:29   Final    Colony Count >=100,000 COLONIES/ML   Final    Culture     Final    Value: PSEUDOMONAS AERUGINOSA     YEAST   Report Status 05/06/2012 FINAL   Final    Organism ID, Bacteria PSEUDOMONAS AERUGINOSA   Final   WOUND CULTURE     Status: Normal   Collection Time   05/04/12  5:47 AM      Component Value Range Status Comment   Specimen Description WOUND BUTTOCK   Final    Special Requests Normal   Final    Gram Stain     Final    Value: MODERATE WBC PRESENT,BOTH PMN AND MONONUCLEAR     RARE SQUAMOUS EPITHELIAL CELLS PRESENT     FEW GRAM NEGATIVE RODS     RARE GRAM POSITIVE COCCI  IN PAIRS   Culture     Final    Value: FEW METHICILLIN RESISTANT STAPHYLOCOCCUS AUREUS     Note: RIFAMPIN AND GENTAMICIN SHOULD NOT BE USED AS SINGLE DRUGS FOR TREATMENT OF STAPH INFECTIONS. This organism is presumed to be Clindamycin resistant based on detection of inducible Clindamycin resistance. CRITICAL RESULT CALLED TO, READ BACK BY AND      VERIFIED WITH: [name] 05/07/12 1310 [name]   Report Status 05/07/2012 FINAL   Final    Organism ID, Bacteria METHICILLIN RESISTANT STAPHYLOCOCCUS AUREUS   Final   CULTURE, BLOOD (ROUTINE X 2)     Status: Normal   Collection Time   05/04/12  9:50 AM      Component Value Range Status Comment   Specimen Description BLOOD HAND LEFT   Final    Special Requests BOTTLES DRAWN AEROBIC ONLY 10CC   Final    Culture  Setup Time 05/04/2012 20:22   Final    Culture NO GROWTH 5 DAYS   Final    Report Status 05/10/2012 FINAL   Final   CULTURE, BLOOD (ROUTINE X 2)     Status: Normal   Collection Time   05/04/12 10:00 AM      Component Value Range Status Comment   Specimen Description BLOOD HAND LEFT   Final    Special Requests      Final    Value: BOTTLES DRAWN AEROBIC AND ANAEROBIC BLUE 10CC  RED 6CC   Culture  Setup Time 05/04/2012 15:27   Final    Culture NO GROWTH 5 DAYS   Final    Report Status 05/10/2012 FINAL   Final   MRSA PCR SCREENING     Status: Normal   Collection Time   05/04/12  5:00 PM      Component Value Range Status Comment   MRSA by PCR NEGATIVE  NEGATIVE Final      Studies: Dg Chest 2 View  04/22/2012  *RADIOLOGY REPORT*  Clinical Data: Chest pain and weakness.  CHEST - 2 VIEW  Comparison: 03/17/2012 and chest CT 10/26/2010  Findings: Two views of the chest were obtained.  Again noted are old left rib fractures.  In addition, there are chronic densities in the right upper chest related to the right scapula and posterior ribs based on previous CT.  Limited evaluation of the lung bases due to patient positioning and body habitus.  Heart and mediastinum appear grossly stable.  Postsurgical changes in the lower cervical spine.  IMPRESSION: Limited examination without acute findings.  There are chronic densities in the right upper chest as described. Evidence of old left rib fractures.   Original Report Authenticated By: Richarda Overlie, M.D.    Dg Abd 1 View  05/10/2012  *RADIOLOGY REPORT*  Clinical Data: Nausea  ABDOMEN - 1 VIEW  Comparison: Yesterday  Findings: NG tube placed.  Gastric distention has resolved.  No disproportionate dilatation of bowel. 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  IMPRESSION: Gastric distention resolved after NG tube placement.   Original Report Authenticated By: Jolaine Click, M.D.    Dg Abd 1 View  05/09/2012  *RADIOLOGY REPORT*  Clinical Data: Vomiting  ABDOMEN - 1 VIEW  Comparison: CT 02/29/2012  Findings: There is massive gaseous distention of the stomach to the approximate level of the duodenal bulb.  Remote fracture deformity with probable neuropathic change proximal right femur and pelvis,  and changed.  No small or large bowel dilatation.  No free air accounting for supine technique.  IMPRESSION: Massive gastric gaseous distention which could reflect gastric outlet obstruction, gastroparesis, or ingested air less likely.   Original Report Authenticated By: Christiana Pellant, M.D.    Mr Pelvis W Wo Contrast  05/04/2012  *RADIOLOGY REPORT*  Clinical Data: Sacral decubitus ulcer with foul-smelling discharge. History of osteomyelitis treated with antibiotics.  MRI PELVIS WITHOUT AND WITH CONTRAST  Technique:  Multiplanar multisequence MR imaging of the pelvis was performed both before and after administration of intravenous contrast.  Contrast: 20mL MULTIHANCE GADOBENATE DIMEGLUMINE 529 MG/ML IV SOLN  Comparison: Pelvic CTs 02/29/2012 and 01/18/2012.  Findings: There is chronic dislocation of the right hip and chronic extensive fragmentation of the right femoral head and neck.  There is extensive heterotopic ossification surrounding the dislocated femoral head.  There is a deep decubitus ulcer posterior to the dislocated proximal femur which extends to the bone as on the prior CT. There are multiple peripherally enhancing fluid collections associated with the dislocated femoral head and lateral to the proximal femur.  The latter are incompletely imaged.  There is diffuse surrounding synovial and muscular enhancement. Edema and enhancement track along the right iliacus muscle.  There is irregular sclerosis, edema and enhancement within the proximal right femur.  Similar involvement is present within the right iliac bone and acetabulum.  There is chronic deformity of the right ischium.  The sacroiliac joints are chronically fused without adjacent marrow edema.  There is chronic deformity of the proximal left femur with surrounding heterotopic ossification.  The left femur demonstrates no suspicious marrow signal or enhancement.  The patient has a descending colostomy and indwelling bladder catheter.   Multiple prominent pelvic and inguinal lymph nodes are present, likely reactive and similar to prior CT.  No intrapelvic fluid collections are identified.  IMPRESSION:  1.  Chronic dislocation of the right hip and fragmentation of the proximal right femur are similar to prior CT.  Comparison between modalities is limited. 2.  Sclerosis, edema and enhancement within the proximal femur and adjacent acetabulum are consistent with chronic osteomyelitis. Superimposed acute osteomyelitis is likely. 3.  Extensive inflammatory changes surrounding the right hip with large irregular peripherally enhancing fluid collections, synovial enhancement and muscular enhancement adjacent to a large decubitus ulcer.  These findings are consistent with chronic soft tissue infection with abscess formation.   Original Report Authenticated By: Carey Bullocks, M.D.    Ir Fluoro Guide Cv Line Right  04/22/2012  *RADIOLOGY REPORT*  Clinical Data: 46 year old with sepsis.  PICC LINE PLACEMENT WITH ULTRASOUND AND FLUOROSCOPIC  GUIDANCE  Fluoroscopy Time: 1.1 minutes.  The right arm was prepped with chlorhexidine, draped in the usual sterile fashion using maximum barrier technique (cap and mask, sterile gown, sterile gloves, large sterile sheet, hand hygiene and cutaneous antisepsis) and infiltrated locally with 1% Lidocaine.  Ultrasound demonstrated patency of the right brachial vein, and this was documented with an image.  Under real-time ultrasound guidance, this vein was accessed with a 21 gauge micropuncture needle and image documentation was performed.  The needle was exchanged over a guidewire for a peel-away sheath through which a five [place] dual lumen PICC trimmed to 52 cm was advanced, positioned with its tip at the lower SVC/right atrial junction. Fluoroscopy during the procedure and fluoro spot radiograph confirms appropriate catheter position.  The catheter was flushed, secured to the skin with Prolene sutures, and covered with a  sterile dressing.  Complications:  None  IMPRESSION: Successful right arm PICC line placement with ultrasound and fluoroscopic guidance.  The catheter is ready for use.   Original Report Authenticated By: Richarda Overlie, M.D.    Ir US Guide Vasc Access Right  04/22/2012  *RADIOLOGY REPORT*  Clinical Data: 46 year old with sepsis.  PICC LINE PLACEMENT WITH ULTRASOUND AND FLUOROSCOPIC  GUIDANCE  Fluoroscopy Time: 1.1 minutes.  The right arm was prepped with chlorhexidine, draped in the usual sterile fashion using maximum barrier technique (cap and mask, sterile gown, sterile gloves, large sterile sheet, hand hygiene and cutaneous antisepsis) and infiltrated locally with 1% Lidocaine.  Ultrasound demonstrated patency of the right brachial vein, and this was documented with an image.  Under real-time ultrasound guidance, this vein was accessed with a 21 gauge micropuncture needle and image documentation was performed.  The needle was exchanged over a guidewire for a peel-away sheath through which a five [place] dual lumen PICC trimmed to 52 cm was advanced, positioned with its tip at the lower SVC/right atrial junction. Fluoroscopy during the procedure and fluoro spot radiograph confirms appropriate catheter position.  The catheter was flushed, secured to the skin with Prolene sutures, and covered with a sterile dressing.  Complications:  None  IMPRESSION: Successful right arm PICC line placement with ultrasound and fluoroscopic guidance.  The catheter is ready for use.   Original Report Authenticated By: Richarda Overlie, M.D.    Dg Chest Portable 1 View  05/04/2012  *RADIOLOGY REPORT*  Clinical Data: Congestion, chills  PORTABLE CHEST - 1 VIEW  Comparison: 04/22/2012, 10/26/2010 CT  Findings: Prominent cardiomediastinal contours. Multiple small calcific densities projecting over the right upper lung and scapula, likely correspond to post-traumatic changes described on prior CT. Mild dependent atelectasis.  No pleural  effusion or pneumothorax.  Prior left rib fractures again noted.  Mild rightward thoracic spine curvature may be exaggerated by positioning. Right PICC tip projects over the proximal SVC.  IMPRESSION: Chronic changes as above.  Cardiomediastinal prominence is similar to prior.   Original Report Authenticated By: Jearld Lesch, M.D.     Scheduled Meds:   . baclofen  20 mg Oral QID  . docusate sodium  100 mg Oral BID  . enoxaparin (LOVENOX) injection  55 mg Subcutaneous Q24H  . famotidine  20 mg Oral BID  . lactose free nutrition  237 mL Oral BID BM  . meropenem (MERREM) IV  1 g Intravenous Q8H  . metoCLOPramide  10 mg Oral BID WC  . nutrition supplement  1 packet Oral BID BM  . potassium chloride SA  20 mEq Oral Daily  . vancomycin  1,500 mg Intravenous Q12H   Continuous Infusions:   . 0.9 % NaCl with KCl 20 mEq / L 100 mL/hr at 05/10/12 1630    Principal Problem:  *Sepsis Active Problems:  OSA (obstructive sleep apnea)  Obesity  Quadriplegia  Seizure disorder  HTN (hypertension)  Sacral decubitus ulcer, stage IV  Recurrent UTI    Time spent: 40 minutes   [facility]  [facility] Pager [phone]. If 8PM-8AM, please contact night-coverage at [email or web address], password [facility] 05/11/2012, 8:28 AM  LOS: 7 days

## 2012-05-11 NOTE — Progress Notes (Signed)
PT HYDROTHERAPY RE-EVALUATION NOTE   05/11/12 0900  Subjective Assessment  Subjective Pt agreeable to hydrotherapy  Patient and Family Stated Goals heal wounds  Date of Onset (prior to admission)  Prior Treatments Previous wound care; hydro therapy; dressing changes with home health  Evaluation and Treatment  Evaluation and Treatment Procedures Explained to Patient/Family Yes  Evaluation and Treatment Procedures agreed to  Pressure Ulcer 04/22/12 Stage II -  Partial thickness loss of dermis presenting as a shallow open ulcer with a red, pink wound bed without slough.  Final Assessment Date/Final Assessment Time: 04/26/12 1608  Date First Assessed/Time First Assessed: 04/22/12 1445   Location: Knee  Location Orientation: Left  Staging: Stage II -  Partial thickness loss of dermis presenting as a shallow open ulcer with  Dressing Type ABD  Dressing Clean;Dry;Intact  Pressure Ulcer 05/04/12 Stage III -  Full thickness tissue loss. Subcutaneous fat may be visible but bone, tendon or muscle are NOT exposed. wound from laptap sitting on knee  Date First Assessed/Time First Assessed: 05/04/12 1730   Location: Knee  Location Orientation: Left  Staging: Stage III -  Full thickness tissue loss. Subcutaneous fat may be visible but bone, tendon or muscle are NOT exposed.  Wound Description (Comment  Dressing Type Gauze (Comment)  Dressing Changed;Clean;Intact  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  Date First Assessed/Time First Assessed: 05/04/12 2030   Location: Buttocks  Location Orientation: Right  Staging: Stage IV - Full thickness tissue loss with exposed bone, tendon or muscle.  Present on Admission: Yes  State of Healing Early/partial granulation  Site / Wound Assessment Pink;Red;Bleeding  % Wound base Red or Granulating 75%  % Wound base Yellow 15%  % Wound base Black 0%  % Wound base Other (Comment) 5% (exposed tendon and floating  bone)  Peri-wound Assessment Edema;Erythema (blanchable);Maceration;Pink  Wound Length (cm) 10 cm  Wound Width (cm) 21 cm  Wound Depth (cm) 6 cm  Tunneling (cm) 4 cm tunnelling at 1:00 and 5 cm tunnelling at 7:00; small 2 cm tunnel underneath tendon  Margins Unattacted edges (unapproximated)  Drainage Amount Moderate  Drainage Description Serosanguineous;Purulent;Green;Odor  Treatment Hydrotherapy (Pulse lavage);Packing (Saline gauze)  Dressing Type ABD;Barrier Film (skin prep);Gauze (Comment);Moist to dry;Tape dressing  Dressing Clean;Dry;Intact  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  Date First Assessed/Time First Assessed: 05/04/12 2030   Location: Sacrum  Location Orientation: Medial  Staging: Stage IV - Full thickness tissue loss with exposed bone, tendon or muscle.  Present on Admission: Yes  Dressing Type Gauze (Comment);Moist to dry  Dressing Clean;Intact  Hydrotherapy  Pulsed lavage therapy - wound location Right Ischial Wound  Pulsed Lavage with Suction (psi) 4 psi (4-8 psi)  Pulsed Lavage with Suction - Normal Saline Used 1000 mL  Pulsed Lavage Tip Tip with splash shield  Selective Debridement  Selective Debridement - Location Right Ischial tuberosity wound margins  Selective Debridement - Tools Used Scissors;Forceps  Selective Debridement - Tissue Removed slough and maceratic necrotic tissue  Wound Therapy - Assess/Plan/Recommendations  Wound Therapy - Clinical Statement Pt presents with stage 4 pressure wounds to his sacrum and buttocks.  Upon reassessment; wounds continue to present with significant margins and tunneling.  Additionally drainage amounts are still excessive particularly to the right buttock sacral wound.  Will contine to provide hydrotherapy to address necrotic tissue and facilitate wound healing.  Wound Therapy -  Functional Problem List decreased skin integrity secondary to immobility  Factors  Delaying/Impairing Wound Healing Altered sensation;Diabetes Mellitus;Infection - systemic/local;Immobility;Multiple medical problems;Vascular compromise  Hydrotherapy Plan Debridement;Dressing change;Patient/family education;Pulsatile lavage with suction  Wound Therapy - Frequency 6X / week  Wound Therapy - Follow Up Recommendations Home health RN  Wound Plan Continue hydrotherapy as indicated  Wound Therapy Goals - Improve the function of patient's integumentary system by progressing the wound(s) through the phases of wound healing by:  Decrease Necrotic Tissue to 10%  Decrease Necrotic Tissue - Progress Progressing toward goal  Increase Granulation Tissue to 90%  Increase Granulation Tissue - Progress Progressing toward goal  Improve Drainage Characteristics Min  Improve Drainage Characteristics - Progress Goal set today  Goals/treatment plan/discharge plan were made with and agreed upon by patient/family Yes  Time For Goal Achievement 7 days  Wound Therapy - Potential for Goals Fair     Charlotte Crumb, PT DPT  [phone]

## 2012-05-12 LAB — CBC
HCT: 28.8 % — ABNORMAL LOW (ref 39.0–52.0)
Hemoglobin: 8.8 g/dL — ABNORMAL LOW (ref 13.0–17.0)
MCH: 24 pg — ABNORMAL LOW (ref 26.0–34.0)
MCHC: 30.6 g/dL (ref 30.0–36.0)
MCV: 78.7 fL (ref 78.0–100.0)
RBC: 3.66 MIL/uL — ABNORMAL LOW (ref 4.22–5.81)

## 2012-05-12 LAB — BASIC METABOLIC PANEL
BUN: 10 mg/dL (ref 6–23)
CO2: 31 mEq/L (ref 19–32)
Calcium: 8.8 mg/dL (ref 8.4–10.5)
Creatinine, Ser: 0.4 mg/dL — ABNORMAL LOW (ref 0.50–1.35)
Glucose, Bld: 97 mg/dL (ref 70–99)

## 2012-05-12 MED ORDER — METOCLOPRAMIDE HCL 5 MG/ML IJ SOLN
5.0000 mg | Freq: Three times a day (TID) | INTRAMUSCULAR | Status: DC
  Administered 2012-05-12 – 2012-05-17 (×14): 5 mg via INTRAVENOUS
  Filled 2012-05-12 (×19): qty 1

## 2012-05-12 NOTE — Progress Notes (Signed)
[facility] PROGRESS NOTE  [name] [name] WUJ:[id number] DOB: 05-Nov-1965 DOA: 05/04/2012 PCP: [name], MD  Assessment/Plan: Principal Problem:  *Sepsis Active Problems:  OSA (obstructive sleep apnea)  Obesity  Quadriplegia  Seizure disorder  HTN (hypertension)  Sacral decubitus ulcer, stage IV  Recurrent UTI    46 year old quadriplegic African American male with history of chronic sacral diabetes ulcer stage IV and left knee wound, with diverting colostomy and chronic suprapubic catheter, who has had history of possible eye to send her current UTIs and soft tissue infection presents to the hospital with sepsis most likely due to infection around the sacral decubitus ulcer with osteomyelitis and possible abscess.  Asian follows with plastic surgeon Dr. Helyn Numbers, he was seen here by Dr. Rennis Chris orthopedic surgery who differed the case to Dr. Helyn Numbers, have discussed the case with Dr. Helyn Numbers who see the patient shortly. Patient is being seen by ID is on broad-spectrum antibiotic continue to monitor cultures. Currently nontoxic appearing.  1. Sepsis most likely due to infection from the chronic stage IV sacral decubitus ulcer and adjacent osteomyelitis with now multiple abscesses seen on MRI  Day # 9 of antbiotics  Day #9 vancomycin  Day 9 of meropenem  - Blood cultures are negative to date, wound cultures growing staph aureus along with some gram-negative rods, urine culture grew Pseudomonas, will monitor culture and sensitivity, appreciate ID input, continue vancomycin along with meropenem, discussed the case with orthopedic surgeon Dr. Rennis Chris who recommended that case be discussed with Dr. Helyn Numbers plastic surgeon as it was too complex for his capabilities, I called Dr. Helyn Numbers on [phone], [phone] . she will see the patient shortly, Continue local wound care for his ulcers. Anticipate 6-8 weeks of antibiotics, awaiting Dr. Dwyane Luo recommendations. Most likely she will operate  next Monday. Her PA saw the patient on 12/24  2. Quadriplegia - with colostomy, suprapubic catheter which is changed the 2 months, urologist Dr. Isabel Caprice. No acute issues continue supportive care.  3. History of recurrent UTIs - UTI versus colonization, noted cultures, antibiotics for #1 above will suffice.  4. Hypertension - blood pressure stable continue present regimen , discontinue Lasix  5. History of seizures - no acute issues we'll monitor clinically. He is not on any anti-seizure medications at home.  6. Low potassium - replace and check magnesium  7.anemia status post transfusion of 2 units of packed red blood cells  8. projectile vomiting/ileus he does have colostomy output, times one yesterday, abdominal KUB showed,Massive gastric gaseous distention which could reflect gastric outlet obstruction, gastroparesis, NG tube was placed, and removed yesterday. nausea improved,. Advance to regular diet. Started on IV Reglan  Code Status: Full  Family Communication: Discussed with the patient  Disposition Plan: To the OR for debridement next Monday, LTAC rehabilitation  Procedures MRI  Consults ID, orthopedics case discussed with Dr. Rennis Chris @ [phone] on 05/06/2012 he recommended to discuss it with Dr. Helyn Numbers, called plastic surgeon Dr. Helyn Numbers on 05/06/2012 @ [phone]  DVT Prophylaxis Lovenox  HPI/Subjective:  Nausea improved      Objective: Filed Vitals:   05/11/12 1333 05/11/12 2220 05/12/12 0047 05/12/12 0615  BP: 148/94 116/82  114/48  Pulse: 92 104 101 98  Temp: 98.4 F (36.9 C) 99.1 F (37.3 C)  98.1 F (36.7 C)  TempSrc: Oral Oral  Oral  Resp: 20 19 18 18   Height:      Weight:      SpO2: 100% 94% 95% 99%    Intake/Output Summary (Last 24  hours) at 05/12/12 0839 Last data filed at 05/12/12 0010  Gross per 24 hour  Intake 2728.33 ml  Output   3600 ml  Net -871.67 ml    Exam:  HENT:  Head: Atraumatic.  Nose: Nose normal.  Mouth/Throat: Oropharynx is clear  and moist.  Eyes: Conjunctivae are normal. Pupils are equal, round, and reactive to light. No scleral icterus.  Neck: Neck supple. No tracheal deviation present.  Cardiovascular: Normal rate, regular rhythm, normal heart sounds and intact distal pulses.  Pulmonary/Chest: Effort normal and breath sounds normal. No respiratory distress.  Abdominal: Soft. Normal appearance and bowel sounds are normal. She exhibits no distension. There is no tenderness.  Musculoskeletal: She exhibits no edema and no tenderness.  Neurological: She is alert. No cranial nerve deficit.    Data Reviewed: Basic Metabolic Panel:  Lab 05/12/12 5366 05/11/12 0503 05/10/12 0535 05/09/12 0530 05/08/12 0500  NA 140 141 145 146* 141  K 3.8 3.6 3.5 3.7 2.8*  CL 102 103 105 107 104  CO2 31 31 33* 31 28  GLUCOSE 97 95 101* 109* 116*  BUN 10 7 9 11 10   CREATININE 0.40* 0.36* 0.41* 0.39* 0.36*  CALCIUM 8.8 8.8 9.2 9.2 8.8  MG -- 2.1 -- -- 1.6  PHOS -- -- -- -- --    Liver Function Tests: No results found for this basename: AST:5,ALT:5,ALKPHOS:5,BILITOT:5,PROT:5,ALBUMIN:5 in the last 168 hours No results found for this basename: LIPASE:5,AMYLASE:5 in the last 168 hours No results found for this basename: AMMONIA:5 in the last 168 hours  CBC:  Lab 05/12/12 0510 05/10/12 0535 05/09/12 0530 05/08/12 0500 05/08/12 0430 05/07/12 0520  WBC 14.6* 16.5* 18.4* 16.8* -- 16.1*  NEUTROABS -- -- -- -- -- --  HGB 8.8* 8.7* 9.0* 8.6* 8.7* --  HCT 28.8* 28.3* 29.0* 28.4* 27.9* --  MCV 78.7 78.0 77.3* 77.2* -- 73.8*  PLT 466* 538* 535* 470* -- 503*    Cardiac Enzymes: No results found for this basename: CKTOTAL:5,CKMB:5,CKMBINDEX:5,TROPONINI:5 in the last 168 hours BNP (last 3 results) No results found for this basename: PROBNP:3 in the last 8760 hours   CBG: No results found for this basename: GLUCAP:5 in the last 168 hours  Recent Results (from the past 240 hour(s))  URINE CULTURE     Status: Normal   Collection Time     05/04/12  4:41 AM      Component Value Range Status Comment   Specimen Description URINE, CATHETERIZED   Final    Special Requests CX ADDED AT 0505 ON 122013   Final    Culture  Setup Time 05/04/2012 05:29   Final    Colony Count >=100,000 COLONIES/ML   Final    Culture     Final    Value: PSEUDOMONAS AERUGINOSA     YEAST   Report Status 05/06/2012 FINAL   Final    Organism ID, Bacteria PSEUDOMONAS AERUGINOSA   Final   WOUND CULTURE     Status: Normal   Collection Time   05/04/12  5:47 AM      Component Value Range Status Comment   Specimen Description WOUND BUTTOCK   Final    Special Requests Normal   Final    Gram Stain     Final    Value: MODERATE WBC PRESENT,BOTH PMN AND MONONUCLEAR     RARE SQUAMOUS EPITHELIAL CELLS PRESENT     FEW GRAM NEGATIVE RODS     RARE GRAM POSITIVE COCCI     IN  PAIRS   Culture     Final    Value: FEW METHICILLIN RESISTANT STAPHYLOCOCCUS AUREUS     Note: RIFAMPIN AND GENTAMICIN SHOULD NOT BE USED AS SINGLE DRUGS FOR TREATMENT OF STAPH INFECTIONS. This organism is presumed to be Clindamycin resistant based on detection of inducible Clindamycin resistance. CRITICAL RESULT CALLED TO, READ BACK BY AND      VERIFIED WITH: [name] 05/07/12 1310 [name]   Report Status 05/07/2012 FINAL   Final    Organism ID, Bacteria METHICILLIN RESISTANT STAPHYLOCOCCUS AUREUS   Final   CULTURE, BLOOD (ROUTINE X 2)     Status: Normal   Collection Time   05/04/12  9:50 AM      Component Value Range Status Comment   Specimen Description BLOOD HAND LEFT   Final    Special Requests BOTTLES DRAWN AEROBIC ONLY 10CC   Final    Culture  Setup Time 05/04/2012 20:22   Final    Culture NO GROWTH 5 DAYS   Final    Report Status 05/10/2012 FINAL   Final   CULTURE, BLOOD (ROUTINE X 2)     Status: Normal   Collection Time   05/04/12 10:00 AM      Component Value Range Status Comment   Specimen Description BLOOD HAND LEFT   Final    Special Requests     Final     Value: BOTTLES DRAWN AEROBIC AND ANAEROBIC BLUE 10CC  RED 6CC   Culture  Setup Time 05/04/2012 15:27   Final    Culture NO GROWTH 5 DAYS   Final    Report Status 05/10/2012 FINAL   Final   MRSA PCR SCREENING     Status: Normal   Collection Time   05/04/12  5:00 PM      Component Value Range Status Comment   MRSA by PCR NEGATIVE  NEGATIVE Final      Studies: Dg Chest 2 View  04/22/2012  *RADIOLOGY REPORT*  Clinical Data: Chest pain and weakness.  CHEST - 2 VIEW  Comparison: 03/17/2012 and chest CT 10/26/2010  Findings: Two views of the chest were obtained.  Again noted are old left rib fractures.  In addition, there are chronic densities in the right upper chest related to the right scapula and posterior ribs based on previous CT.  Limited evaluation of the lung bases due to patient positioning and body habitus.  Heart and mediastinum appear grossly stable.  Postsurgical changes in the lower cervical spine.  IMPRESSION: Limited examination without acute findings.  There are chronic densities in the right upper chest as described. Evidence of old left rib fractures.   Original Report Authenticated By: Richarda Overlie, M.D.    Dg Abd 1 View  05/11/2012  *RADIOLOGY REPORT*  Clinical Data: Ileus  ABDOMEN - 1 VIEW  Comparison: 05/10/2012  Findings: Tube tip remains in the stomach.  The stomach is distended.  Gas filled loops of small and large bowel are present without disproportionate dilatation.  Pelvic deformity is again noted.  IMPRESSION: Gastric distention has developed despite NG tube position in the stomach. Low wall suction may be helpful.  No evidence of small bowel obstruction or small bowel ileus.   Original Report Authenticated By: Jolaine Click, M.D.    Dg Abd 1 View  05/10/2012  *RADIOLOGY REPORT*  Clinical Data: Nausea  ABDOMEN - 1 VIEW  Comparison: Yesterday  Findings: NG tube placed.  Gastric distention has resolved.  No disproportionate dilatation of bowel. 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  IMPRESSION: Gastric distention resolved after NG tube placement.   Original Report Authenticated By: Jolaine Click, M.D.    Dg Abd 1 View  05/09/2012  *RADIOLOGY REPORT*  Clinical Data: Vomiting  ABDOMEN - 1 VIEW  Comparison: CT 02/29/2012  Findings: There is massive gaseous distention of the stomach to the approximate level of the duodenal bulb.  Remote fracture deformity with probable neuropathic change proximal right femur and pelvis, and changed.  No small or large bowel dilatation.  No free air accounting for supine technique.  IMPRESSION: Massive gastric gaseous distention which could reflect gastric outlet obstruction, gastroparesis, or ingested air less likely.   Original Report Authenticated By: Christiana Pellant, M.D.    Mr Pelvis W Wo Contrast  05/04/2012  *RADIOLOGY REPORT*  Clinical Data: Sacral decubitus ulcer with foul-smelling discharge. History of osteomyelitis treated with antibiotics.  MRI PELVIS WITHOUT AND WITH CONTRAST  Technique:  Multiplanar multisequence MR imaging of the pelvis was performed both before and after administration of intravenous contrast.  Contrast: 20mL MULTIHANCE GADOBENATE DIMEGLUMINE 529 MG/ML IV SOLN  Comparison: Pelvic CTs 02/29/2012 and 01/18/2012.  Findings: There is chronic dislocation of the right hip and chronic extensive fragmentation of the right femoral head and neck.  There is extensive heterotopic ossification surrounding the dislocated femoral head.  There is a deep decubitus ulcer posterior to the dislocated proximal femur which extends to the bone as on the prior CT. There are multiple peripherally enhancing fluid collections associated with the dislocated femoral head and lateral to the proximal femur.  The latter are incompletely imaged.  There is diffuse surrounding synovial and muscular enhancement. Edema and enhancement track along the right iliacus  muscle.  There is irregular sclerosis, edema and enhancement within the proximal right femur.  Similar involvement is present within the right iliac bone and acetabulum.  There is chronic deformity of the right ischium.  The sacroiliac joints are chronically fused without adjacent marrow edema.  There is chronic deformity of the proximal left femur with surrounding heterotopic ossification.  The left femur demonstrates no suspicious marrow signal or enhancement.  The patient has a descending colostomy and indwelling bladder catheter.  Multiple prominent pelvic and inguinal lymph nodes are present, likely reactive and similar to prior CT.  No intrapelvic fluid collections are identified.  IMPRESSION:  1.  Chronic dislocation of the right hip and fragmentation of the proximal right femur are similar to prior CT.  Comparison between modalities is limited. 2.  Sclerosis, edema and enhancement within the proximal femur and adjacent acetabulum are consistent with chronic osteomyelitis. Superimposed acute osteomyelitis is likely. 3.  Extensive inflammatory changes surrounding the right hip with large irregular peripherally enhancing fluid collections, synovial enhancement and muscular enhancement adjacent to a large decubitus ulcer.  These findings are consistent with chronic soft tissue infection with abscess formation.   Original Report Authenticated By: Carey Bullocks, M.D.    Ir Fluoro Guide Cv Line Right  04/22/2012  *RADIOLOGY REPORT*  Clinical Data: 46 year old with sepsis.  PICC LINE PLACEMENT WITH ULTRASOUND AND FLUOROSCOPIC  GUIDANCE  Fluoroscopy Time: 1.1 minutes.  The right arm was prepped with chlorhexidine, draped in the usual sterile fashion using maximum barrier technique (cap and mask, sterile gown, sterile gloves, large sterile sheet, hand hygiene and cutaneous antisepsis) and infiltrated locally with 1% Lidocaine.  Ultrasound demonstrated patency of the right brachial vein, and this was  documented  with an image.  Under real-time ultrasound guidance, this vein was accessed with a 21 gauge micropuncture needle and image documentation was performed.  The needle was exchanged over a guidewire for a peel-away sheath through which a five [place] dual lumen PICC trimmed to 52 cm was advanced, positioned with its tip at the lower SVC/right atrial junction. Fluoroscopy during the procedure and fluoro spot radiograph confirms appropriate catheter position.  The catheter was flushed, secured to the skin with Prolene sutures, and covered with a sterile dressing.  Complications:  None  IMPRESSION: Successful right arm PICC line placement with ultrasound and fluoroscopic guidance.  The catheter is ready for use.   Original Report Authenticated By: Richarda Overlie, M.D.    Ir US Guide Vasc Access Right  04/22/2012  *RADIOLOGY REPORT*  Clinical Data: 46 year old with sepsis.  PICC LINE PLACEMENT WITH ULTRASOUND AND FLUOROSCOPIC  GUIDANCE  Fluoroscopy Time: 1.1 minutes.  The right arm was prepped with chlorhexidine, draped in the usual sterile fashion using maximum barrier technique (cap and mask, sterile gown, sterile gloves, large sterile sheet, hand hygiene and cutaneous antisepsis) and infiltrated locally with 1% Lidocaine.  Ultrasound demonstrated patency of the right brachial vein, and this was documented with an image.  Under real-time ultrasound guidance, this vein was accessed with a 21 gauge micropuncture needle and image documentation was performed.  The needle was exchanged over a guidewire for a peel-away sheath through which a five [place] dual lumen PICC trimmed to 52 cm was advanced, positioned with its tip at the lower SVC/right atrial junction. Fluoroscopy during the procedure and fluoro spot radiograph confirms appropriate catheter position.  The catheter was flushed, secured to the skin with Prolene sutures, and covered with a sterile dressing.  Complications:  None  IMPRESSION: Successful right arm PICC line  placement with ultrasound and fluoroscopic guidance.  The catheter is ready for use.   Original Report Authenticated By: Richarda Overlie, M.D.    Dg Chest Portable 1 View  05/04/2012  *RADIOLOGY REPORT*  Clinical Data: Congestion, chills  PORTABLE CHEST - 1 VIEW  Comparison: 04/22/2012, 10/26/2010 CT  Findings: Prominent cardiomediastinal contours. Multiple small calcific densities projecting over the right upper lung and scapula, likely correspond to post-traumatic changes described on prior CT. Mild dependent atelectasis.  No pleural effusion or pneumothorax.  Prior left rib fractures again noted.  Mild rightward thoracic spine curvature may be exaggerated by positioning. Right PICC tip projects over the proximal SVC.  IMPRESSION: Chronic changes as above.  Cardiomediastinal prominence is similar to prior.   Original Report Authenticated By: Jearld Lesch, M.D.     Scheduled Meds:   . baclofen  20 mg Oral QID  . docusate sodium  100 mg Oral BID  . enoxaparin (LOVENOX) injection  55 mg Subcutaneous Q24H  . famotidine  20 mg Oral BID  . lactose free nutrition  237 mL Oral BID BM  . meropenem (MERREM) IV  1 g Intravenous Q8H  . metoCLOPramide  10 mg Oral BID WC  . nutrition supplement  1 packet Oral BID BM  . potassium chloride SA  20 mEq Oral Daily  . vancomycin  1,500 mg Intravenous Q12H   Continuous Infusions:   . 0.9 % NaCl with KCl 20 mEq / L 50 mL/hr at 05/11/12 9562    Principal Problem:  *Sepsis Active Problems:  OSA (obstructive sleep apnea)  Obesity  Quadriplegia  Seizure disorder  HTN (hypertension)  Sacral decubitus ulcer, stage IV  Recurrent  UTI    Time spent: 40 minutes   [facility]  [facility] Pager [phone]. If 8PM-8AM, please contact night-coverage at [email or web address], password [facility] 05/12/2012, 8:39 AM  LOS: 8 days
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
## 2012-05-12 NOTE — Progress Notes (Signed)
PT Hydrotherapy  Start time: 0838 End time: 0922   05/12/12 1610  Subjective Assessment  Subjective Pt agreeable to hydrotherapy  Patient and Family Stated Goals heal wounds  Date of Onset (PTA)  Prior Treatments Previous wound care; hydro therapy; dressing changes with home health  Evaluation and Treatment  Evaluation and Treatment Procedures Explained to Patient/Family Yes  Evaluation and Treatment Procedures agreed to  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  Date First Assessed/Time First Assessed: 05/04/12 2030   Location: Buttocks  Location Orientation: Left  Staging: Stage IV - Full thickness tissue loss with exposed bone, tendon or muscle.  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  Date First Assessed/Time First Assessed: 05/04/12 2030   Location: Buttocks  Location Orientation: Right  Staging: Stage IV - Full thickness tissue loss with exposed bone, tendon or muscle.  Present on Admission: Yes  State of Healing Early/partial granulation  Site / Wound Assessment Bleeding;Friable;Granulation tissue;Yellow  % Wound base Red or Granulating 75%  % Wound base Yellow 20%  % Wound base Black 0%  % Wound base Other (Comment) 5% (exposed tendon (necrotic))  Peri-wound Assessment Maceration    Margins Unattacted edges (unapproximated)  Drainage Amount Moderate  Drainage Description Serosanguineous  Treatment Hydrotherapy (Pulse lavage);Packing (Saline gauze)  Dressing Type ABD;Barrier Film (skin prep);Gauze (Comment);Moist to moist;Tape dressing (saline kerlix to pack upper buttock/hip; dry 4x4)  Dressing Clean;Dry;Intact;Changed  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  Date First Assessed/Time First Assessed: 05/04/12 2030   Location: Sacrum  Location Orientation: Medial  Staging: Stage IV - Full thickness tissue loss with exposed bone, tendon or muscle.  Present on Admission: Yes  State of Healing Fully granulated  Site / Wound Assessment Red  % Wound base Red or Granulating 100%  % Wound base Yellow 0%  % Wound base Black 0%  Peri-wound Assessment Maceration  Margins Unattacted edges (unapproximated)  Drainage Amount Scant  Drainage Description Serosanguineous  Treatment Hydrotherapy (Pulse lavage);Packing (Saline gauze)  Dressing Type Barrier Film (skin prep);Gauze (Comment);ABD;Moist to moist;Tape dressing (2x2)  Dressing Changed;Clean;Dry;Intact  Hydrotherapy  Pulsed lavage therapy - wound location Rt buttock, Rt ischium, sacrum, Lt buttock  Pulsed Lavage with Suction (psi) (4-8psi)  Pulsed Lavage with Suction - Normal Saline Used 1000 mL  Pulsed Lavage Tip Tip with splash shield  Selective Debridement  Selective Debridement - Location Rt upper buttock, Rt lower buttock, lt buttock  Selective Debridement - Tools Used Scissors;Forceps  Selective Debridement - Tissue Removed adherent yellow necrotic tissue, necrotic tendon  Wound Therapy - Assess/Plan/Recommendations  Wound Therapy - Clinical Statement Pt with continued stage IV pressure wounds to bil buttocks and sacrum. Drainage and odor significantly improved this date (non-purulent, serosanguineous). If drainage remains improved, likely can d/c  hydrotherapy in next 2-3 visits.  Wound Therapy - Functional Problem List decreased  skin integrity secondary to immobility  Factors Delaying/Impairing Wound Healing Altered sensation;Diabetes Mellitus;Infection - systemic/local;Immobility;Multiple medical problems;Vascular compromise  Hydrotherapy Plan Debridement;Dressing change;Patient/family education;Pulsatile lavage with suction  Wound Therapy - Frequency 6X / week  Wound Therapy - Follow Up Recommendations Skilled nursing facility (pt may refuse; has failed wound management at home previousl)  Wound Plan Continue hydrotherapy as indicated  Wound Therapy Goals - Improve the function of patient's integumentary system by progressing the wound(s) through the phases of wound healing by:  Decrease Necrotic Tissue to 10%  Decrease Necrotic Tissue - Progress Progressing toward goal  Increase Granulation Tissue to 90%  Increase Granulation Tissue - Progress Progressing toward goal  Improve Drainage Characteristics Min  Improve Drainage Characteristics - Progress Progressing toward goal    Pt denied pain    05/12/2012 Veda Canning, PT Pager: [phone]

## 2012-05-13 ENCOUNTER — Other Ambulatory Visit: Payer: Self-pay | Admitting: Plastic Surgery

## 2012-05-13 ENCOUNTER — Inpatient Hospital Stay (HOSPITAL_COMMUNITY): Payer: Medicare Other

## 2012-05-13 DIAGNOSIS — L89154 Pressure ulcer of sacral region, stage 4: Secondary | ICD-10-CM

## 2012-05-13 MED ORDER — CEFAZOLIN SODIUM-DEXTROSE 2-3 GM-% IV SOLR
2.0000 g | INTRAVENOUS | Status: DC
  Filled 2012-05-13: qty 50

## 2012-05-13 NOTE — Progress Notes (Signed)
Patient placed on CPAP for HS.  Pressure titrated to 17 cmH20 per patient request and home settings.  Full face mask used, with 2 LPM bleed in, as per home regimen.  Patient tolerated well.

## 2012-05-13 NOTE — Progress Notes (Signed)
Subjective: Sacral ulcer secondary to pressure  Objective: Vital signs in last 24 hours: Temp:  [98.7 F (37.1 C)-99.3 F (37.4 C)] 98.8 F (37.1 C) (12/29 0507) Pulse Rate:  [75-102] 75  (12/29 0507) Resp:  [16-19] 16  (12/29 0507) BP: (96-146)/(70-95) 96/95 mmHg (12/29 0507) SpO2:  [98 %-99 %] 98 % (12/29 0507) Last BM Date: 05/13/12  Intake/Output from previous day: 12/28 0701 - 12/29 0700 In: 720 [P.O.:720] Out: 4200 [Urine:4200] Intake/Output this shift:    General appearance: alert, cooperative and no distress Incision/Wound: Stage 4 sacral ulcer  Lab Results:   [facility] 05/12/12 0510  WBC 14.6*  HGB 8.8*  HCT 28.8*  PLT 466*   BMET  Basename 05/12/12 0510 05/11/12 0503  NA 140 141  K 3.8 3.6  CL 102 103  CO2 31 31  GLUCOSE 97 95  BUN 10 7  CREATININE 0.40* 0.36*  CALCIUM 8.8 8.8   PT/INR No results found for this basename: LABPROT:2,INR:2 in the last 72 hours ABG No results found for this basename: PHART:2,PCO2:2,PO2:2,HCO3:2 in the last 72 hours  Studies/Results: Dg Abd 1 View  05/11/2012  *RADIOLOGY REPORT*  Clinical Data: Ileus  ABDOMEN - 1 VIEW  Comparison: 05/10/2012  Findings: Tube tip remains in the stomach.  The stomach is distended.  Gas filled loops of small and large bowel are present without disproportionate dilatation.  Pelvic deformity is again noted.  IMPRESSION: Gastric distention has developed despite NG tube position in the stomach. Low wall suction may be helpful.  No evidence of small bowel obstruction or small bowel ileus.   Original Report Authenticated By: Jolaine Click, M.D.     Anti-infectives: Anti-infectives     Start     Dose/Rate Route Frequency Ordered Stop   05/08/12 1400   meropenem (MERREM) 1 g in sodium chloride 0.9 % 100 mL IVPB        1 g 200 mL/hr over 30 Minutes Intravenous 3 times per day 05/08/12 1332     05/04/12 2200   vancomycin (VANCOCIN) 1,500 mg in sodium chloride 0.9 % 500 mL IVPB        1,500  mg 250 mL/hr over 120 Minutes Intravenous Every 12 hours 05/04/12 0909     05/04/12 1600   imipenem-cilastatin (PRIMAXIN) 500 mg in sodium chloride 0.9 % 100 mL IVPB  Status:  Discontinued        500 mg 200 mL/hr over 30 Minutes Intravenous Every 6 hours 05/04/12 0909 05/08/12 1328   05/04/12 0915   vancomycin (VANCOCIN) 500 mg in sodium chloride 0.9 % 100 mL IVPB        500 mg 100 mL/hr over 60 Minutes Intravenous To Emergency Dept 05/04/12 0907 05/04/12 1126   05/04/12 0915   imipenem-cilastatin (PRIMAXIN) 500 mg in sodium chloride 0.9 % 100 mL IVPB        500 mg 200 mL/hr over 30 Minutes Intravenous To Emergency Dept 05/04/12 0907 05/04/12 1346   05/04/12 0615   vancomycin (VANCOCIN) IVPB 1000 mg/200 mL premix        1,000 mg 200 mL/hr over 60 Minutes Intravenous  Once 05/04/12 0611 05/04/12 0821   05/04/12 0615   piperacillin-tazobactam (ZOSYN) IVPB 3.375 g        3.375 g 12.5 mL/hr over 240 Minutes Intravenous  Once 05/04/12 0611 05/04/12 1059          Assessment/Plan: s/p Procedure(s) (LRB) with comments: IRRIGATION AND DEBRIDEMENT WOUND (N/A) - Irrigation and Debridement of Sacral Ulcer  with Placement of Acell and Wound Vac Call made to OR main to try to get the patient to the OR tomorrow for irrigation and debridement of his sacral ulcer..  OR planned for 3:30 Monday.  Please be sure patient is NPO after midnight.  LOS: 9 days    [facility] 05/13/2012

## 2012-05-13 NOTE — Progress Notes (Signed)
[facility] PROGRESS NOTE  [name] [name] ZOX:[id number] DOB: 11/15/1965 DOA: 05/04/2012 PCP: [name], MD  Assessment/Plan: Principal Problem:  *Sepsis Active Problems:  OSA (obstructive sleep apnea)  Obesity  Quadriplegia  Seizure disorder  HTN (hypertension)  Sacral decubitus ulcer, stage IV  Recurrent UTI    47 year old quadriplegic African American male with history of chronic sacral diabetes ulcer stage IV and left knee wound, with diverting colostomy and chronic suprapubic catheter, who has had history of possible eye to send her current UTIs and soft tissue infection presents to the hospital with sepsis most likely due to infection around the sacral decubitus ulcer with osteomyelitis and possible abscess.  Asian follows with plastic surgeon Dr. Helyn Numbers, he was seen here by Dr. Rennis Chris orthopedic surgery who differed the case to Dr. Helyn Numbers, have discussed the case with Dr. Helyn Numbers who see the patient shortly. Patient is being seen by ID is on broad-spectrum antibiotic continue to monitor cultures. Currently nontoxic appearing.    1. Sepsis most likely due to infection from the chronic stage IV sacral decubitus ulcer and adjacent osteomyelitis with now multiple abscesses seen on MRI  Day # 10 of antbiotics  Day #10 vancomycin  Day 10 of meropenem  Per infectious disease the patient will need 6-8 weeks of IV antibiotics - Blood cultures are negative to date, wound cultures growing staph aureus along with some gram-negative rods, urine culture grew Pseudomonas, will monitor culture and sensitivity, appreciate ID input, continue vancomycin along with meropenem, discussed the case with orthopedic surgeon Dr. Rennis Chris who recommended that case be discussed with Dr. Helyn Numbers plastic surgeon as it was too complex for his capabilities, I called Dr. Helyn Numbers on [phone], [phone] . she will see the patient shortly, Continue local wound care for his ulcers. Anticipate 6-8 weeks of  antibiotics, awaiting Dr. Dwyane Luo recommendations. I called her again today. Most likely she will operate next Monday. Her PA saw the patient on 12/24  2. Quadriplegia - with colostomy, suprapubic catheter which is changed the 2 months, urologist Dr. Isabel Caprice. No acute issues continue supportive care.  3. History of recurrent UTIs - UTI versus colonization, noted cultures, antibiotics for #1 above will suffice.  4. Hypertension - blood pressure stable continue present regimen , discontinue Lasix  5. History of seizures - no acute issues we'll monitor clinically. He is not on any anti-seizure medications at home.  6. Low potassium - replace and check magnesium  7.anemia status post transfusion of 2 units of packed red blood cells  8. projectile vomiting/ileus he does have colostomy output, times one yesterday, abdominal KUB showed,Massive gastric gaseous distention which could reflect gastric outlet obstruction, gastroparesis, NG tube was placed, and removed yesterday. nausea improved,. Advance to regular diet. Started on IV Reglan    Code Status: Full  Family Communication: Discussed with the patient  Disposition Plan: To the OR for debridement next Monday, LTAC rehabilitation    Procedures MRI  Consults ID, orthopedics case discussed with Dr. Rennis Chris @ [phone] on 05/06/2012 he recommended to discuss it with Dr. Helyn Numbers, called plastic surgeon Dr. Helyn Numbers on 05/06/2012 @ [phone]  DVT Prophylaxis Lovenox    HPI/Subjective:  Nausea improved       Objective: Filed Vitals:   05/12/12 1420 05/12/12 2100 05/13/12 0000 05/13/12 0507  BP: 135/70 146/75  96/95  Pulse: 101 97 102 75  Temp: 98.7 F (37.1 C) 99.3 F (37.4 C)  98.8 F (37.1 C)  TempSrc: Oral Oral    Resp: 18 19 16  16  Height:      Weight:      SpO2: 99% 99% 98% 98%    Intake/Output Summary (Last 24 hours) at 05/13/12 1610 Last data filed at 05/13/12 0506  Gross per 24 hour  Intake    720 ml  Output   4200 ml    Net  -3480 ml    Exam:  General: Alert and awake, oriented x3, morbidly obese he became diaphoretic while we did the exam of his wound  HEENT: anicteric sclera, pupils reactive to light and accommodation, EOMI, oropharynx clear and without exudate  CVS tachycardic rate, normal r, no murmur rubs or gallops  Chest: clear to auscultation bilaterally, no wheezing, rales or rhonchi  Abdomen: soft nontender, nondistended, his ostomy bag is full of brown stool  Skin: did not examine decubitus ulcer today  Neuro: Quadriplegic    Data Reviewed: Basic Metabolic Panel:  Lab 05/12/12 9604 05/11/12 0503 05/10/12 0535 05/09/12 0530 05/08/12 0500  NA 140 141 145 146* 141  K 3.8 3.6 3.5 3.7 2.8*  CL 102 103 105 107 104  CO2 31 31 33* 31 28  GLUCOSE 97 95 101* 109* 116*  BUN 10 7 9 11 10   CREATININE 0.40* 0.36* 0.41* 0.39* 0.36*  CALCIUM 8.8 8.8 9.2 9.2 8.8  MG -- 2.1 -- -- 1.6  PHOS -- -- -- -- --    Liver Function Tests: No results found for this basename: AST:5,ALT:5,ALKPHOS:5,BILITOT:5,PROT:5,ALBUMIN:5 in the last 168 hours No results found for this basename: LIPASE:5,AMYLASE:5 in the last 168 hours No results found for this basename: AMMONIA:5 in the last 168 hours  CBC:  Lab 05/12/12 0510 05/10/12 0535 05/09/12 0530 05/08/12 0500 05/08/12 0430 05/07/12 0520  WBC 14.6* 16.5* 18.4* 16.8* -- 16.1*  NEUTROABS -- -- -- -- -- --  HGB 8.8* 8.7* 9.0* 8.6* 8.7* --  HCT 28.8* 28.3* 29.0* 28.4* 27.9* --  MCV 78.7 78.0 77.3* 77.2* -- 73.8*  PLT 466* 538* 535* 470* -- 503*    Cardiac Enzymes: No results found for this basename: CKTOTAL:5,CKMB:5,CKMBINDEX:5,TROPONINI:5 in the last 168 hours BNP (last 3 results) No results found for this basename: PROBNP:3 in the last 8760 hours   CBG: No results found for this basename: GLUCAP:5 in the last 168 hours  Recent Results (from the past 240 hour(s))  URINE CULTURE     Status: Normal   Collection Time   05/04/12  4:41 AM      Component  Value Range Status Comment   Specimen Description URINE, CATHETERIZED   Final    Special Requests CX ADDED AT 0505 ON 122013   Final    Culture  Setup Time 05/04/2012 05:29   Final    Colony Count >=100,000 COLONIES/ML   Final    Culture     Final    Value: PSEUDOMONAS AERUGINOSA     YEAST   Report Status 05/06/2012 FINAL   Final    Organism ID, Bacteria PSEUDOMONAS AERUGINOSA   Final   WOUND CULTURE     Status: Normal   Collection Time   05/04/12  5:47 AM      Component Value Range Status Comment   Specimen Description WOUND BUTTOCK   Final    Special Requests Normal   Final    Gram Stain     Final    Value: MODERATE WBC PRESENT,BOTH PMN AND MONONUCLEAR     RARE SQUAMOUS EPITHELIAL CELLS PRESENT     FEW GRAM NEGATIVE RODS  RARE GRAM POSITIVE COCCI     IN PAIRS   Culture     Final    Value: FEW METHICILLIN RESISTANT STAPHYLOCOCCUS AUREUS     Note: RIFAMPIN AND GENTAMICIN SHOULD NOT BE USED AS SINGLE DRUGS FOR TREATMENT OF STAPH INFECTIONS. This organism is presumed to be Clindamycin resistant based on detection of inducible Clindamycin resistance. CRITICAL RESULT CALLED TO, READ BACK BY AND      VERIFIED WITH: [name] 05/07/12 1310 [name]   Report Status 05/07/2012 FINAL   Final    Organism ID, Bacteria METHICILLIN RESISTANT STAPHYLOCOCCUS AUREUS   Final   CULTURE, BLOOD (ROUTINE X 2)     Status: Normal   Collection Time   05/04/12  9:50 AM      Component Value Range Status Comment   Specimen Description BLOOD HAND LEFT   Final    Special Requests BOTTLES DRAWN AEROBIC ONLY 10CC   Final    Culture  Setup Time 05/04/2012 20:22   Final    Culture NO GROWTH 5 DAYS   Final    Report Status 05/10/2012 FINAL   Final   CULTURE, BLOOD (ROUTINE X 2)     Status: Normal   Collection Time   05/04/12 10:00 AM      Component Value Range Status Comment   Specimen Description BLOOD HAND LEFT   Final    Special Requests     Final    Value: BOTTLES DRAWN AEROBIC AND ANAEROBIC  BLUE 10CC  RED 6CC   Culture  Setup Time 05/04/2012 15:27   Final    Culture NO GROWTH 5 DAYS   Final    Report Status 05/10/2012 FINAL   Final   MRSA PCR SCREENING     Status: Normal   Collection Time   05/04/12  5:00 PM      Component Value Range Status Comment   MRSA by PCR NEGATIVE  NEGATIVE Final      Studies: Dg Chest 2 View  04/22/2012  *RADIOLOGY REPORT*  Clinical Data: Chest pain and weakness.  CHEST - 2 VIEW  Comparison: 03/17/2012 and chest CT 10/26/2010  Findings: Two views of the chest were obtained.  Again noted are old left rib fractures.  In addition, there are chronic densities in the right upper chest related to the right scapula and posterior ribs based on previous CT.  Limited evaluation of the lung bases due to patient positioning and body habitus.  Heart and mediastinum appear grossly stable.  Postsurgical changes in the lower cervical spine.  IMPRESSION: Limited examination without acute findings.  There are chronic densities in the right upper chest as described. Evidence of old left rib fractures.   Original Report Authenticated By: Richarda Overlie, M.D.    Dg Abd 1 View  05/11/2012  *RADIOLOGY REPORT*  Clinical Data: Ileus  ABDOMEN - 1 VIEW  Comparison: 05/10/2012  Findings: Tube tip remains in the stomach.  The stomach is distended.  Gas filled loops of small and large bowel are present without disproportionate dilatation.  Pelvic deformity is again noted.  IMPRESSION: Gastric distention has developed despite NG tube position in the stomach. Low wall suction may be helpful.  No evidence of small bowel obstruction or small bowel ileus.   Original Report Authenticated By: Jolaine Click, M.D.    Dg Abd 1 View  05/10/2012  *RADIOLOGY REPORT*  Clinical Data: Nausea  ABDOMEN - 1 VIEW  Comparison: Yesterday  Findings: NG tube placed.  Gastric distention has  resolved.  No disproportionate dilatation of bowel. 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  IMPRESSION: Gastric distention resolved after NG tube placement.   Original Report Authenticated By: Jolaine Click, M.D.    Dg Abd 1 View  05/09/2012  *RADIOLOGY REPORT*  Clinical Data: Vomiting  ABDOMEN - 1 VIEW  Comparison: CT 02/29/2012  Findings: There is massive gaseous distention of the stomach to the approximate level of the duodenal bulb.  Remote fracture deformity with probable neuropathic change proximal right femur and pelvis, and changed.  No small or large bowel dilatation.  No free air accounting for supine technique.  IMPRESSION: Massive gastric gaseous distention which could reflect gastric outlet obstruction, gastroparesis, or ingested air less likely.   Original Report Authenticated By: Christiana Pellant, M.D.    Mr Pelvis W Wo Contrast  05/04/2012  *RADIOLOGY REPORT*  Clinical Data: Sacral decubitus ulcer with foul-smelling discharge. History of osteomyelitis treated with antibiotics.  MRI PELVIS WITHOUT AND WITH CONTRAST  Technique:  Multiplanar multisequence MR imaging of the pelvis was performed both before and after administration of intravenous contrast.  Contrast: 20mL MULTIHANCE GADOBENATE DIMEGLUMINE 529 MG/ML IV SOLN  Comparison: Pelvic CTs 02/29/2012 and 01/18/2012.  Findings: There is chronic dislocation of the right hip and chronic extensive fragmentation of the right femoral head and neck.  There is extensive heterotopic ossification surrounding the dislocated femoral head.  There is a deep decubitus ulcer posterior to the dislocated proximal femur which extends to the bone as on the prior CT. There are multiple peripherally enhancing fluid collections associated with the dislocated femoral head and lateral to the proximal femur.  The latter are incompletely imaged.  There is diffuse surrounding synovial and muscular enhancement. Edema and enhancement track along the right iliacus muscle.  There is irregular sclerosis, edema and  enhancement within the proximal right femur.  Similar involvement is present within the right iliac bone and acetabulum.  There is chronic deformity of the right ischium.  The sacroiliac joints are chronically fused without adjacent marrow edema.  There is chronic deformity of the proximal left femur with surrounding heterotopic ossification.  The left femur demonstrates no suspicious marrow signal or enhancement.  The patient has a descending colostomy and indwelling bladder catheter.  Multiple prominent pelvic and inguinal lymph nodes are present, likely reactive and similar to prior CT.  No intrapelvic fluid collections are identified.  IMPRESSION:  1.  Chronic dislocation of the right hip and fragmentation of the proximal right femur are similar to prior CT.  Comparison between modalities is limited. 2.  Sclerosis, edema and enhancement within the proximal femur and adjacent acetabulum are consistent with chronic osteomyelitis. Superimposed acute osteomyelitis is likely. 3.  Extensive inflammatory changes surrounding the right hip with large irregular peripherally enhancing fluid collections, synovial enhancement and muscular enhancement adjacent to a large decubitus ulcer.  These findings are consistent with chronic soft tissue infection with abscess formation.   Original Report Authenticated By: Carey Bullocks, M.D.    Ir Fluoro Guide Cv Line Right  04/22/2012  *RADIOLOGY REPORT*  Clinical Data: 46 year old with sepsis.  PICC LINE PLACEMENT WITH ULTRASOUND AND FLUOROSCOPIC  GUIDANCE  Fluoroscopy Time: 1.1 minutes.  The right arm was prepped with chlorhexidine, draped in the usual sterile fashion using maximum barrier technique (cap and mask, sterile gown, sterile gloves, large sterile sheet, hand hygiene and cutaneous antisepsis) and infiltrated locally with 1% Lidocaine.  Ultrasound demonstrated  patency of the right brachial vein, and this was documented with an image.  Under real-time ultrasound guidance,  this vein was accessed with a 21 gauge micropuncture needle and image documentation was performed.  The needle was exchanged over a guidewire for a peel-away sheath through which a five [place] dual lumen PICC trimmed to 52 cm was advanced, positioned with its tip at the lower SVC/right atrial junction. Fluoroscopy during the procedure and fluoro spot radiograph confirms appropriate catheter position.  The catheter was flushed, secured to the skin with Prolene sutures, and covered with a sterile dressing.  Complications:  None  IMPRESSION: Successful right arm PICC line placement with ultrasound and fluoroscopic guidance.  The catheter is ready for use.   Original Report Authenticated By: Richarda Overlie, M.D.    Ir US Guide Vasc Access Right  04/22/2012  *RADIOLOGY REPORT*  Clinical Data: 46 year old with sepsis.  PICC LINE PLACEMENT WITH ULTRASOUND AND FLUOROSCOPIC  GUIDANCE  Fluoroscopy Time: 1.1 minutes.  The right arm was prepped with chlorhexidine, draped in the usual sterile fashion using maximum barrier technique (cap and mask, sterile gown, sterile gloves, large sterile sheet, hand hygiene and cutaneous antisepsis) and infiltrated locally with 1% Lidocaine.  Ultrasound demonstrated patency of the right brachial vein, and this was documented with an image.  Under real-time ultrasound guidance, this vein was accessed with a 21 gauge micropuncture needle and image documentation was performed.  The needle was exchanged over a guidewire for a peel-away sheath through which a five [place] dual lumen PICC trimmed to 52 cm was advanced, positioned with its tip at the lower SVC/right atrial junction. Fluoroscopy during the procedure and fluoro spot radiograph confirms appropriate catheter position.  The catheter was flushed, secured to the skin with Prolene sutures, and covered with a sterile dressing.  Complications:  None  IMPRESSION: Successful right arm PICC line placement with ultrasound and fluoroscopic guidance.   The catheter is ready for use.   Original Report Authenticated By: Richarda Overlie, M.D.    Dg Chest Portable 1 View  05/04/2012  *RADIOLOGY REPORT*  Clinical Data: Congestion, chills  PORTABLE CHEST - 1 VIEW  Comparison: 04/22/2012, 10/26/2010 CT  Findings: Prominent cardiomediastinal contours. Multiple small calcific densities projecting over the right upper lung and scapula, likely correspond to post-traumatic changes described on prior CT. Mild dependent atelectasis.  No pleural effusion or pneumothorax.  Prior left rib fractures again noted.  Mild rightward thoracic spine curvature may be exaggerated by positioning. Right PICC tip projects over the proximal SVC.  IMPRESSION: Chronic changes as above.  Cardiomediastinal prominence is similar to prior.   Original Report Authenticated By: Jearld Lesch, M.D.     Scheduled Meds:   . baclofen  20 mg Oral QID  . docusate sodium  100 mg Oral BID  . enoxaparin (LOVENOX) injection  55 mg Subcutaneous Q24H  . famotidine  20 mg Oral BID  . lactose free nutrition  237 mL Oral BID BM  . meropenem (MERREM) IV  1 g Intravenous Q8H  . metoCLOPramide (REGLAN) injection  5 mg Intravenous Q8H  . nutrition supplement  1 packet Oral BID BM  . potassium chloride SA  20 mEq Oral Daily  . vancomycin  1,500 mg Intravenous Q12H   Continuous Infusions:   . 0.9 % NaCl with KCl 20 mEq / L 50 mL/hr at 05/12/12 1031    Principal Problem:  *Sepsis Active Problems:  OSA (obstructive sleep apnea)  Obesity  Quadriplegia  Seizure disorder  HTN (hypertension)  Sacral decubitus ulcer, stage IV  Recurrent UTI    Time spent: 40 minutes   [facility]  [facility] Pager [phone]. If 8PM-8AM, please contact night-coverage at [email or web address], password [facility] 05/13/2012, 8:07 AM  LOS: 9 days

## 2012-05-14 ENCOUNTER — Encounter (HOSPITAL_COMMUNITY): Payer: Self-pay | Admitting: Plastic Surgery

## 2012-05-14 ENCOUNTER — Encounter (HOSPITAL_COMMUNITY): Payer: Self-pay | Admitting: Anesthesiology

## 2012-05-14 ENCOUNTER — Encounter (HOSPITAL_COMMUNITY)
Admission: EM | Disposition: A | Discharge status: Home / Self Care | Payer: Self-pay | Source: Home / Self Care | Attending: Internal Medicine

## 2012-05-14 ENCOUNTER — Inpatient Hospital Stay (HOSPITAL_COMMUNITY): Payer: Medicare Other | Admitting: Anesthesiology

## 2012-05-14 HISTORY — PX: INCISION AND DRAINAGE OF WOUND: SHX1803

## 2012-05-14 LAB — CBC
HCT: 28.3 % — ABNORMAL LOW (ref 39.0–52.0)
Hemoglobin: 9 g/dL — ABNORMAL LOW (ref 13.0–17.0)
MCH: 25 pg — ABNORMAL LOW (ref 26.0–34.0)
MCHC: 31.8 g/dL (ref 30.0–36.0)

## 2012-05-14 LAB — COMPREHENSIVE METABOLIC PANEL
ALT: 11 U/L (ref 0–53)
AST: 14 U/L (ref 0–37)
Albumin: 2.4 g/dL — ABNORMAL LOW (ref 3.5–5.2)
Calcium: 8.9 mg/dL (ref 8.4–10.5)
Sodium: 143 mEq/L (ref 135–145)
Total Protein: 7.6 g/dL (ref 6.0–8.3)

## 2012-05-14 SURGERY — IRRIGATION AND DEBRIDEMENT WOUND
Anesthesia: General | Site: Buttocks | Laterality: Right | Wound class: Dirty or Infected

## 2012-05-14 MED ORDER — SODIUM CHLORIDE 0.9 % IR SOLN
Status: DC | PRN
  Administered 2012-05-14: 17:00:00

## 2012-05-14 MED ORDER — BACITRACIN-NEOMYCIN-POLYMYXIN 400-5-5000 EX OINT
TOPICAL_OINTMENT | CUTANEOUS | Status: AC
  Filled 2012-05-14: qty 1

## 2012-05-14 MED ORDER — LACTATED RINGERS IV SOLN
INTRAVENOUS | Status: DC | PRN
  Administered 2012-05-14: 16:00:00 via INTRAVENOUS

## 2012-05-14 MED ORDER — ONDANSETRON HCL 4 MG/2ML IJ SOLN
INTRAMUSCULAR | Status: DC | PRN
  Administered 2012-05-14: 4 mg via INTRAVENOUS

## 2012-05-14 MED ORDER — BACITRACIN-NEOMYCIN-POLYMYXIN OINTMENT TUBE
TOPICAL_OINTMENT | CUTANEOUS | Status: DC | PRN
  Administered 2012-05-14: 1 via TOPICAL

## 2012-05-14 MED ORDER — SODIUM CHLORIDE 0.9 % IR SOLN
Status: DC | PRN
  Administered 2012-05-14: 1

## 2012-05-14 MED ORDER — MIDAZOLAM HCL 5 MG/5ML IJ SOLN
INTRAMUSCULAR | Status: DC | PRN
  Administered 2012-05-14 (×2): 1 mg via INTRAVENOUS

## 2012-05-14 MED ORDER — PROPOFOL 10 MG/ML IV BOLUS
INTRAVENOUS | Status: DC | PRN
  Administered 2012-05-14: 200 mg via INTRAVENOUS

## 2012-05-14 MED ORDER — MIDAZOLAM HCL 2 MG/2ML IJ SOLN: 0.5000 mg | INTRAMUSCULAR | Status: DC | PRN

## 2012-05-14 MED ORDER — PHENYLEPHRINE HCL 10 MG/ML IJ SOLN
INTRAMUSCULAR | Status: DC | PRN
  Administered 2012-05-14 (×4): 80 ug via INTRAVENOUS

## 2012-05-14 MED ORDER — ENOXAPARIN SODIUM 60 MG/0.6ML ~~LOC~~ SOLN
55.0000 mg | SUBCUTANEOUS | Status: DC
  Administered 2012-05-14 – 2012-05-22 (×8): 55 mg via SUBCUTANEOUS
  Filled 2012-05-14 (×9): qty 0.6

## 2012-05-14 MED ORDER — FENTANYL CITRATE 0.05 MG/ML IJ SOLN: 50.0000 ug | INTRAMUSCULAR | Status: DC | PRN

## 2012-05-14 MED ORDER — ROCURONIUM BROMIDE 100 MG/10ML IV SOLN
INTRAVENOUS | Status: DC | PRN
  Administered 2012-05-14: 50 mg via INTRAVENOUS

## 2012-05-14 MED ORDER — FENTANYL CITRATE 0.05 MG/ML IJ SOLN
INTRAMUSCULAR | Status: DC | PRN
  Administered 2012-05-14: 50 ug via INTRAVENOUS

## 2012-05-14 SURGICAL SUPPLY — 37 items
APL SKNCLS STERI-STRIP NONHPOA (GAUZE/BANDAGES/DRESSINGS) ×1
BANDAGE GAUZE ELAST BULKY 4 IN (GAUZE/BANDAGES/DRESSINGS) IMPLANT
BENZOIN TINCTURE PRP APPL 2/3 (GAUZE/BANDAGES/DRESSINGS) ×1 IMPLANT
BLADE SURG ROTATE 9660 (MISCELLANEOUS) IMPLANT
CANISTER SUCTION 2500CC (MISCELLANEOUS) ×2 IMPLANT
CHLORAPREP W/TINT 26ML (MISCELLANEOUS) IMPLANT
CLOTH BEACON ORANGE TIMEOUT ST (SAFETY) ×2 IMPLANT
COVER SURGICAL LIGHT HANDLE (MISCELLANEOUS) ×2 IMPLANT
DRAPE INCISE IOBAN 66X45 STRL (DRAPES) ×1 IMPLANT
DRAPE PED LAPAROTOMY (DRAPES) ×2 IMPLANT
DRAPE PROXIMA HALF (DRAPES) IMPLANT
DRSG ADAPTIC 3X8 NADH LF (GAUZE/BANDAGES/DRESSINGS) ×1 IMPLANT
DRSG PAD ABDOMINAL 8X10 ST (GAUZE/BANDAGES/DRESSINGS) ×4 IMPLANT
DRSG VAC ATS MED SENSATRAC (GAUZE/BANDAGES/DRESSINGS) ×1 IMPLANT
ELECT CAUTERY BLADE 6.4 (BLADE) ×2 IMPLANT
ELECT REM PT RETURN 9FT ADLT (ELECTROSURGICAL) ×2
ELECTRODE REM PT RTRN 9FT ADLT (ELECTROSURGICAL) ×1 IMPLANT
GLOVE BIO SURGEON STRL SZ 6.5 (GLOVE) ×2 IMPLANT
GOWN STRL NON-REIN LRG LVL3 (GOWN DISPOSABLE) ×4 IMPLANT
HANDPIECE INTERPULSE COAX TIP (DISPOSABLE)
KIT BASIN OR (CUSTOM PROCEDURE TRAY) ×2 IMPLANT
KIT ROOM TURNOVER OR (KITS) ×2 IMPLANT
MATRIX SURGICAL PSMX 10X15CM (Tissue) ×1 IMPLANT
MICROMATRIX 1000MG (Tissue) ×2 IMPLANT
NS IRRIG 1000ML POUR BTL (IV SOLUTION) ×2 IMPLANT
PACK GENERAL/GYN (CUSTOM PROCEDURE TRAY) ×2 IMPLANT
PAD ARMBOARD 7.5X6 YLW CONV (MISCELLANEOUS) ×4 IMPLANT
PAD NEG PRESSURE SENSATRAC (MISCELLANEOUS) ×1 IMPLANT
SET HNDPC FAN SPRY TIP SCT (DISPOSABLE) IMPLANT
SOLUTION PARTIC MCRMTRX 1000MG (Tissue) IMPLANT
SPONGE GAUZE 4X4 12PLY (GAUZE/BANDAGES/DRESSINGS) ×2 IMPLANT
SWAB COLLECTION DEVICE MRSA (MISCELLANEOUS) IMPLANT
TAPE CLOTH SURG 4X10 WHT LF (GAUZE/BANDAGES/DRESSINGS) ×1 IMPLANT
TOWEL OR 17X24 6PK STRL BLUE (TOWEL DISPOSABLE) ×2 IMPLANT
TOWEL OR 17X26 10 PK STRL BLUE (TOWEL DISPOSABLE) ×2 IMPLANT
TUBE ANAEROBIC SPECIMEN COL (MISCELLANEOUS) IMPLANT
UNDERPAD 30X30 INCONTINENT (UNDERPADS AND DIAPERS) ×1 IMPLANT

## 2012-05-14 NOTE — Progress Notes (Signed)
Pt to O.R. Via bed. Family at bedside.

## 2012-05-14 NOTE — Progress Notes (Signed)
Patient returned from O.R. VAC dressing to right buttock. IVF infusing. Family at bedside.

## 2012-05-14 NOTE — Brief Op Note (Signed)
05/04/2012 - 05/14/2012  5:14 PM  PATIENT:  [name] [name]  46 y.o. male  PRE-OPERATIVE DIAGNOSIS:  sacral ulcer wound   POST-OPERATIVE DIAGNOSIS:  same  PROCEDURE:  Procedure(s) (LRB) with comments: IRRIGATION AND DEBRIDEMENT WOUND (Right) - Irrigation and Debridement of Sacral Ulcer with Placement of Acell and Wound Vac  SURGEON:  Surgeon(s) and Role:    * Jakyren Fluegge Sanger, DO - Primary  PHYSICIAN ASSISTANT: Shawn Rayburn, PA  ASSISTANTS: none   ANESTHESIA:   none  EBL:  Total I/O In: 300 [I.V.:300] Out: 1170 [Urine:900; Emesis/NG output:250; Blood:20]  BLOOD ADMINISTERED:none  DRAINS: none   LOCAL MEDICATIONS USED:  NONE  SPECIMEN:  Source of Specimen:  right ischial tissue and femur bone  DISPOSITION OF SPECIMEN:  microbiology  COUNTS:  YES  TOURNIQUET:  * No tourniquets in log *  DICTATION: .Dragon Dictation  PLAN OF CARE: transfer back to room  PATIENT DISPOSITION:  PACU - hemodynamically stable.   Delay start of Pharmacological VTE agent (>24hrs) due to surgical blood loss or risk of bleeding: no

## 2012-05-14 NOTE — Progress Notes (Signed)
NUTRITION FOLLOW UP  Intervention:   Continue current supplements as able once placed back on Regular diet. RD to continue to follow nutrition care plan.  Nutrition Dx:   Increased nutrient needs related to wound healing as evidenced by estimated needs. Ongoing.  Goal:   Pt to meet >/= 90% of their estimated nutrition needs.   Monitor:   weight trends, lab trends, I/O's, PO intake, supplement tolerance  Assessment:   Receiving hydrotherapy for wound. Developed projectile vomiting on 12/25. Abdominal KUB showed massive gastric gaseous distention which could reflect gastric outlet obstruction or gastroparesis. NGT placed and pt made NPO. Advanced to Regular diet 12/27. Currently NPO for OR trip this afternoon for I&D of sacral ulcer with VAC placement.  Intake of meal is 25 - 75% since diet advanced to Regular. Continues on Juven po BID for wound healing and Boost po BID.  Height: Ht Readings from Last 1 Encounters:  05/04/12 6' (1.829 m)    Weight Status:  No new wt available Wt Readings from Last 1 Encounters:  05/04/12 246 lb 0.5 oz (111.6 kg)    Estimated needs:  Kcal: 2100 - 2300 kcal Protein: 120 - 130 grams protein Fluid: 2.1 - 2.3 liters daily  Skin: stage III and IV wounds  Diet Order: NPO   Intake/Output Summary (Last 24 hours) at 05/14/12 1200 Last data filed at 05/14/12 0824  Gross per 24 hour  Intake    480 ml  Output   4150 ml  Net  -3670 ml    Last BM: 12/29  Labs:   Lab 05/12/12 0510 05/11/12 0503 05/10/12 0535 05/08/12 0500  NA 140 141 145 --  K 3.8 3.6 3.5 --  CL 102 103 105 --  CO2 31 31 33* --  BUN 10 7 9  --  CREATININE 0.40* 0.36* 0.41* --  CALCIUM 8.8 8.8 9.2 --  MG -- 2.1 -- 1.6  PHOS -- -- -- --  GLUCOSE 97 95 101* --    CBG (last 3)  No results found for this basename: GLUCAP:3 in the last 72 hours  Scheduled Meds:   . baclofen  20 mg Oral QID  .  ceFAZolin (ANCEF) IV  2 g Intravenous On Call to OR  . docusate sodium  100  mg Oral BID  . enoxaparin  55 mg Subcutaneous Q24H  . famotidine  20 mg Oral BID  . lactose free nutrition  237 mL Oral BID BM  . meropenem (MERREM) IV  1 g Intravenous Q8H  . metoCLOPramide (REGLAN) injection  5 mg Intravenous Q8H  . nutrition supplement  1 packet Oral BID BM  . potassium chloride SA  20 mEq Oral Daily  . vancomycin  1,500 mg Intravenous Q12H    Continuous Infusions:   . 0.9 % NaCl with KCl 20 mEq / L 50 mL/hr at 05/14/12 [address] [place], [place], [place] Pager: [phone] After-hours pager: [phone]

## 2012-05-14 NOTE — Preoperative (Signed)
Beta Blockers   Reason not to administer Beta Blockers:Not Applicable 

## 2012-05-14 NOTE — Progress Notes (Signed)
[facility] PROGRESS NOTE  [name] [name] ZOX:[id number] DOB: 1965-11-13 DOA: 05/04/2012 PCP: [name], MD  Assessment/Plan: Principal Problem:  *Sepsis Active Problems:  OSA (obstructive sleep apnea)  Obesity  Quadriplegia  Seizure disorder  HTN (hypertension)  Sacral decubitus ulcer, stage IV  Recurrent UTI    46 year old quadriplegic African American male with history of chronic sacral diabetes ulcer stage IV and left knee wound, with diverting colostomy and chronic suprapubic catheter, who has had history of possible eye to send her current UTIs and soft tissue infection presents to the hospital with sepsis most likely due to infection around the sacral decubitus ulcer with osteomyelitis and possible abscess.  Asian follows with plastic surgeon Dr. Helyn Numbers, he was seen here by Dr. Rennis Chris orthopedic surgery who differed the case to Dr. Helyn Numbers, have discussed the case with Dr. Helyn Numbers who see the patient shortly. Patient is being seen by ID is on broad-spectrum antibiotic continue to monitor cultures. Currently nontoxic appearing.  1. Sepsis most likely due to infection from the chronic stage IV sacral decubitus ulcer and adjacent osteomyelitis with now multiple abscesses seen on MRI  Day # 10 of antbiotics  Day #10 vancomycin  Day 10 of meropenem  Per infectious disease the patient will need 6-8 weeks of IV antibiotics  - Blood cultures are negative to date, wound cultures growing staph aureus along with some gram-negative rods, urine culture grew Pseudomonas, will monitor culture and sensitivity, appreciate ID input, continue vancomycin along with meropenem, discussed the case with orthopedic surgeon Dr. Rennis Chris who recommended that case be discussed with Dr. Helyn Numbers plastic surgeon as it was too complex for his capabilities, I called Dr. Helyn Numbers on [phone], [phone] . she will see the patient shortly, Continue local wound care for his ulcers. Anticipate 6-8 weeks of  antibiotics, awaiting Dr. Dwyane Luo recommendations. I called her again today. Most likely she will operate next Monday. Her PA saw the patient on 12/24  2. Quadriplegia - with colostomy, suprapubic catheter which is changed the 2 months, urologist Dr. Isabel Caprice. No acute issues continue supportive care.  3. History of recurrent UTIs - UTI versus colonization, noted cultures, antibiotics for #1 above will suffice.  4. Hypertension - blood pressure stable continue present regimen , discontinue Lasix  5. History of seizures - no acute issues we'll monitor clinically. He is not on any anti-seizure medications at home.  6. Low potassium - replace and check magnesium  7.anemia status post transfusion of 2 units of packed red blood cells  8. projectile vomiting/ileus he does have colostomy output, times one yesterday, abdominal KUB showed,Massive gastric gaseous distention which could reflect gastric outlet obstruction, gastroparesis, NG tube was replaced, npo for now Code Status: Full  Family Communication: Discussed with the patient  Disposition Plan: To the OR for debridement next Monday, LTAC rehabilitation  Procedures MRI  Consults ID, orthopedics case discussed with Dr. Rennis Chris @ [phone] on 05/06/2012 he recommended to discuss it with Dr. Helyn Numbers, called plastic surgeon Dr. Helyn Numbers on 05/06/2012 @ [phone]  DVT Prophylaxis Lovenox  HPI/Subjective:  Nausea improved       Objective: Filed Vitals:   05/13/12 0507 05/13/12 1415 05/13/12 2100 05/14/12 0614  BP: 96/95 97/62 122/69 122/81  Pulse: 75 93 74 91  Temp: 98.8 F (37.1 C) 98.4 F (36.9 C) 98.7 F (37.1 C) 98.3 F (36.8 C)  TempSrc:  Oral Oral   Resp: 16 18 19 18   Height:      Weight:      SpO2:  98% 97% 97% 97%    Intake/Output Summary (Last 24 hours) at 05/14/12 0838 Last data filed at 05/14/12 1610  Gross per 24 hour  Intake    720 ml  Output   4150 ml  Net  -3430 ml    Exam:  General: Alert and awake, oriented  x3, morbidly obese he became diaphoretic while we did the exam of his wound  HEENT: anicteric sclera, pupils reactive to light and accommodation, EOMI, oropharynx clear and without exudate  CVS tachycardic rate, normal r, no murmur rubs or gallops  Chest: clear to auscultation bilaterally, no wheezing, rales or rhonchi  Abdomen: soft nontender, nondistended, his ostomy bag is full of brown stool  Skin: did not examine decubitus ulcer today  Neuro: Quadriplegic    Data Reviewed: Basic Metabolic Panel:  Lab 05/12/12 9604 05/11/12 0503 05/10/12 0535 05/09/12 0530 05/08/12 0500  NA 140 141 145 146* 141  K 3.8 3.6 3.5 3.7 2.8*  CL 102 103 105 107 104  CO2 31 31 33* 31 28  GLUCOSE 97 95 101* 109* 116*  BUN 10 7 9 11 10   CREATININE 0.40* 0.36* 0.41* 0.39* 0.36*  CALCIUM 8.8 8.8 9.2 9.2 8.8  MG -- 2.1 -- -- 1.6  PHOS -- -- -- -- --    Liver Function Tests: No results found for this basename: AST:5,ALT:5,ALKPHOS:5,BILITOT:5,PROT:5,ALBUMIN:5 in the last 168 hours No results found for this basename: LIPASE:5,AMYLASE:5 in the last 168 hours No results found for this basename: AMMONIA:5 in the last 168 hours  CBC:  Lab 05/12/12 0510 05/10/12 0535 05/09/12 0530 05/08/12 0500 05/08/12 0430  WBC 14.6* 16.5* 18.4* 16.8* --  NEUTROABS -- -- -- -- --  HGB 8.8* 8.7* 9.0* 8.6* 8.7*  HCT 28.8* 28.3* 29.0* 28.4* 27.9*  MCV 78.7 78.0 77.3* 77.2* --  PLT 466* 538* 535* 470* --    Cardiac Enzymes: No results found for this basename: CKTOTAL:5,CKMB:5,CKMBINDEX:5,TROPONINI:5 in the last 168 hours BNP (last 3 results) No results found for this basename: PROBNP:3 in the last 8760 hours   CBG: No results found for this basename: GLUCAP:5 in the last 168 hours  Recent Results (from the past 240 hour(s))  CULTURE, BLOOD (ROUTINE X 2)     Status: Normal   Collection Time   05/04/12  9:50 AM      Component Value Range Status Comment   Specimen Description BLOOD HAND LEFT   Final    Special  Requests BOTTLES DRAWN AEROBIC ONLY 10CC   Final    Culture  Setup Time 05/04/2012 20:22   Final    Culture NO GROWTH 5 DAYS   Final    Report Status 05/10/2012 FINAL   Final   CULTURE, BLOOD (ROUTINE X 2)     Status: Normal   Collection Time   05/04/12 10:00 AM      Component Value Range Status Comment   Specimen Description BLOOD HAND LEFT   Final    Special Requests     Final    Value: BOTTLES DRAWN AEROBIC AND ANAEROBIC BLUE 10CC  RED 6CC   Culture  Setup Time 05/04/2012 15:27   Final    Culture NO GROWTH 5 DAYS   Final    Report Status 05/10/2012 FINAL   Final   MRSA PCR SCREENING     Status: Normal   Collection Time   05/04/12  5:00 PM      Component Value Range Status Comment   MRSA by PCR NEGATIVE  NEGATIVE Final      Studies: Dg Chest 2 View  04/22/2012  *RADIOLOGY REPORT*  Clinical Data: Chest pain and weakness.  CHEST - 2 VIEW  Comparison: 03/17/2012 and chest CT 10/26/2010  Findings: Two views of the chest were obtained.  Again noted are old left rib fractures.  In addition, there are chronic densities in the right upper chest related to the right scapula and posterior ribs based on previous CT.  Limited evaluation of the lung bases due to patient positioning and body habitus.  Heart and mediastinum appear grossly stable.  Postsurgical changes in the lower cervical spine.  IMPRESSION: Limited examination without acute findings.  There are chronic densities in the right upper chest as described. Evidence of old left rib fractures.   Original Report Authenticated By: Richarda Overlie, M.D.    Dg Abd 1 View  05/11/2012  *RADIOLOGY REPORT*  Clinical Data: Ileus  ABDOMEN - 1 VIEW  Comparison: 05/10/2012  Findings: Tube tip remains in the stomach.  The stomach is distended.  Gas filled loops of small and large bowel are present without disproportionate dilatation.  Pelvic deformity is again noted.  IMPRESSION: Gastric distention has developed despite NG tube position in the stomach. Low  wall suction may be helpful.  No evidence of small bowel obstruction or small bowel ileus.   Original Report Authenticated By: Jolaine Click, M.D.    Dg Abd 1 View  05/10/2012  *RADIOLOGY REPORT*  Clinical Data: Nausea  ABDOMEN - 1 VIEW  Comparison: Yesterday  Findings: NG tube placed.  Gastric distention has resolved.  No disproportionate dilatation of bowel. 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  IMPRESSION: Gastric distention resolved after NG tube placement.   Original Report Authenticated By: Jolaine Click, M.D.    Dg Abd 1 View  05/09/2012  *RADIOLOGY REPORT*  Clinical Data: Vomiting  ABDOMEN - 1 VIEW  Comparison: CT 02/29/2012  Findings: There is massive gaseous distention of the stomach to the approximate level of the duodenal bulb.  Remote fracture deformity with probable neuropathic change proximal right femur and pelvis, and changed.  No small or large bowel dilatation.  No free air accounting for supine technique.  IMPRESSION: Massive gastric gaseous distention which could reflect gastric outlet obstruction, gastroparesis, or ingested air less likely.   Original Report Authenticated By: Christiana Pellant, M.D.    Mr Pelvis W Wo Contrast  05/04/2012  *RADIOLOGY REPORT*  Clinical Data: Sacral decubitus ulcer with foul-smelling discharge. History of osteomyelitis treated with antibiotics.  MRI PELVIS WITHOUT AND WITH CONTRAST  Technique:  Multiplanar multisequence MR imaging of the pelvis was performed both before and after administration of intravenous contrast.  Contrast: 20mL MULTIHANCE GADOBENATE DIMEGLUMINE 529 MG/ML IV SOLN  Comparison: Pelvic CTs 02/29/2012 and 01/18/2012.  Findings: There is chronic dislocation of the right hip and chronic extensive fragmentation of the right femoral head and neck.  There is extensive heterotopic ossification surrounding the dislocated femoral head.  There is a deep  decubitus ulcer posterior to the dislocated proximal femur which extends to the bone as on the prior CT. There are multiple peripherally enhancing fluid collections associated with the dislocated femoral head and lateral to the proximal femur.  The latter are incompletely imaged.  There is diffuse surrounding synovial and muscular enhancement. Edema and enhancement track along the right iliacus muscle.  There is irregular sclerosis, edema and enhancement within the proximal right femur.  Similar involvement is present within the right iliac bone and acetabulum.  There is chronic deformity of the right ischium.  The sacroiliac joints are chronically fused without adjacent marrow edema.  There is chronic deformity of the proximal left femur with surrounding heterotopic ossification.  The left femur demonstrates no suspicious marrow signal or enhancement.  The patient has a descending colostomy and indwelling bladder catheter.  Multiple prominent pelvic and inguinal lymph nodes are present, likely reactive and similar to prior CT.  No intrapelvic fluid collections are identified.  IMPRESSION:  1.  Chronic dislocation of the right hip and fragmentation of the proximal right femur are similar to prior CT.  Comparison between modalities is limited. 2.  Sclerosis, edema and enhancement within the proximal femur and adjacent acetabulum are consistent with chronic osteomyelitis. Superimposed acute osteomyelitis is likely. 3.  Extensive inflammatory changes surrounding the right hip with large irregular peripherally enhancing fluid collections, synovial enhancement and muscular enhancement adjacent to a large decubitus ulcer.  These findings are consistent with chronic soft tissue infection with abscess formation.   Original Report Authenticated By: Carey Bullocks, M.D.    Ir Fluoro Guide Cv Line Right  04/22/2012  *RADIOLOGY REPORT*  Clinical Data: 46 year old with sepsis.  PICC LINE PLACEMENT WITH ULTRASOUND AND  FLUOROSCOPIC  GUIDANCE  Fluoroscopy Time: 1.1 minutes.  The right arm was prepped with chlorhexidine, draped in the usual sterile fashion using maximum barrier technique (cap and mask, sterile gown, sterile gloves, large sterile sheet, hand hygiene and cutaneous antisepsis) and infiltrated locally with 1% Lidocaine.  Ultrasound demonstrated patency of the right brachial vein, and this was documented with an image.  Under real-time ultrasound guidance, this vein was accessed with a 21 gauge micropuncture needle and image documentation was performed.  The needle was exchanged over a guidewire for a peel-away sheath through which a five [place] dual lumen PICC trimmed to 52 cm was advanced, positioned with its tip at the lower SVC/right atrial junction. Fluoroscopy during the procedure and fluoro spot radiograph confirms appropriate catheter position.  The catheter was flushed, secured to the skin with Prolene sutures, and covered with a sterile dressing.  Complications:  None  IMPRESSION: Successful right arm PICC line placement with ultrasound and fluoroscopic guidance.  The catheter is ready for use.   Original Report Authenticated By: Richarda Overlie, M.D.    Ir US Guide Vasc Access Right  04/22/2012  *RADIOLOGY REPORT*  Clinical Data: 46 year old with sepsis.  PICC LINE PLACEMENT WITH ULTRASOUND AND FLUOROSCOPIC  GUIDANCE  Fluoroscopy Time: 1.1 minutes.  The right arm was prepped with chlorhexidine, draped in the usual sterile fashion using maximum barrier technique (cap and mask, sterile gown, sterile gloves, large sterile sheet, hand hygiene and cutaneous antisepsis) and infiltrated locally with 1% Lidocaine.  Ultrasound demonstrated patency of the right brachial vein, and this was documented with an image.  Under real-time ultrasound guidance, this vein was accessed with a 21 gauge micropuncture needle and image documentation was performed.  The needle was exchanged over a guidewire for a peel-away sheath through  which a five [place] dual lumen PICC trimmed to 52 cm was advanced, positioned with its tip at the lower SVC/right atrial junction. Fluoroscopy during the procedure and fluoro spot radiograph confirms appropriate catheter position.  The catheter was flushed, secured to the skin with Prolene sutures, and covered with a sterile dressing.  Complications:  None  IMPRESSION: Successful right arm PICC line placement with ultrasound and fluoroscopic guidance.  The catheter is  ready for use.   Original Report Authenticated By: Richarda Overlie, M.D.    Dg Chest Portable 1 View  05/04/2012  *RADIOLOGY REPORT*  Clinical Data: Congestion, chills  PORTABLE CHEST - 1 VIEW  Comparison: 04/22/2012, 10/26/2010 CT  Findings: Prominent cardiomediastinal contours. Multiple small calcific densities projecting over the right upper lung and scapula, likely correspond to post-traumatic changes described on prior CT. Mild dependent atelectasis.  No pleural effusion or pneumothorax.  Prior left rib fractures again noted.  Mild rightward thoracic spine curvature may be exaggerated by positioning. Right PICC tip projects over the proximal SVC.  IMPRESSION: Chronic changes as above.  Cardiomediastinal prominence is similar to prior.   Original Report Authenticated By: Jearld Lesch, M.D.    Dg Abd Portable 1v  05/14/2012  *RADIOLOGY REPORT*  Clinical Data: Vomiting and abdominal distension.  PORTABLE ABDOMEN - 1 VIEW  Comparison: Abdominal radiographs, the most recent of which was performed 05/11/2012  Findings: There is recurrent massive distension of the stomach, following removal of the patient's enteric tube.  As significant air is seen within the first and second segment of the duodenum, this may reflect gastroparesis or less likely obstruction at the level of the duodenum.  The visualized bowel gas pattern is otherwise grossly unremarkable. No free intra-abdominal air is identified, though evaluation is limited on a supine views.  No  acute osseous abnormalities are seen.  A small left pleural effusion is seen; mild bibasilar opacities likely reflect atelectasis.  IMPRESSION:  1.  Recurrent massive distension of the stomach, following removal of the patient's enteric tube.  As significant air is seen within the proximal duodenum, this may reflect gastroparesis, or less likely obstruction at the third segment of the duodenum.  Recommend immediate placement of enteric tube. 2.  Small left pleural effusion seen; mild bibasilar opacities likely reflect atelectasis.  These results were called by telephone on 05/13/2012 at 12:19 a.m. to Nursing on [facility], who verbally acknowledged these results.   Original Report Authenticated By: Tonia Ghent, M.D.     Scheduled Meds:   . baclofen  20 mg Oral QID  .  ceFAZolin (ANCEF) IV  2 g Intravenous On Call to OR  . docusate sodium  100 mg Oral BID  . enoxaparin (LOVENOX) injection  55 mg Subcutaneous Q24H  . famotidine  20 mg Oral BID  . lactose free nutrition  237 mL Oral BID BM  . meropenem (MERREM) IV  1 g Intravenous Q8H  . metoCLOPramide (REGLAN) injection  5 mg Intravenous Q8H  . nutrition supplement  1 packet Oral BID BM  . potassium chloride SA  20 mEq Oral Daily  . vancomycin  1,500 mg Intravenous Q12H   Continuous Infusions:   . 0.9 % NaCl with KCl 20 mEq / L 50 mL/hr at 05/12/12 1031    Principal Problem:  *Sepsis Active Problems:  OSA (obstructive sleep apnea)  Obesity  Quadriplegia  Seizure disorder  HTN (hypertension)  Sacral decubitus ulcer, stage IV  Recurrent UTI    Time spent: 40 minutes   [facility]  [facility] Pager [phone]. If 8PM-8AM, please contact night-coverage at [email or web address], password [facility] 05/14/2012, 8:38 AM  LOS: 10 days

## 2012-05-14 NOTE — Transfer of Care (Signed)
Immediate Anesthesia Transfer of Care Note  Patient: [name] [name]  Procedure(s) Performed: Procedure(s) (LRB) with comments: IRRIGATION AND DEBRIDEMENT WOUND (Right) - Irrigation and Debridement of Sacral Ulcer with Placement of Acell and Wound Vac  Patient Location: PACU  Anesthesia Type:General  Level of Consciousness: awake, alert  and patient cooperative  Airway & Oxygen Therapy: Patient Spontanous Breathing and Patient connected to face mask oxygen  Post-op Assessment: Report given to PACU RN, Post -op Vital signs reviewed and stable, Patient moving all extremities and Patient moving all extremities X 4  Post vital signs: Reviewed and stable  Complications: No apparent anesthesia complications
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
## 2012-05-14 NOTE — Anesthesia Procedure Notes (Signed)
Procedure Name: Intubation Date/Time: 05/14/2012 4:04 PM Performed by: Elon Alas Pre-anesthesia Checklist: Patient identified, Timeout performed, Emergency Drugs available, Suction available and Patient being monitored Patient Re-evaluated:Patient Re-evaluated prior to inductionOxygen Delivery Method: Circle system utilized Preoxygenation: Pre-oxygenation with 100% oxygen Intubation Type: IV induction Ventilation: Oral airway inserted - appropriate to patient size and Mask ventilation without difficulty Tube type: Oral Tube size: 7.5 mm Number of attempts: 1 Airway Equipment and Method: Video-laryngoscopy and Stylet Placement Confirmation: positive ETCO2,  ETT inserted through vocal cords under direct vision and breath sounds checked- equal and bilateral Secured at: 24 cm Tube secured with: Tape Dental Injury: Teeth and Oropharynx as per pre-operative assessment

## 2012-05-14 NOTE — Anesthesia Preprocedure Evaluation (Addendum)
Anesthesia Evaluation  Patient identified by MRN, date of birth, ID band Patient awake    Reviewed: Allergy & Precautions, H&P , NPO status , Patient's Chart, lab work & pertinent test results  Airway Mallampati: III TM Distance: >3 FB Neck ROM: Full    Dental  (+) Teeth Intact and Dental Advisory Given   Pulmonary asthma , sleep apnea ,  Hx of acute resp failure, trach in past         Cardiovascular hypertension, + Peripheral Vascular Disease     Neuro/Psych Seizures -,  quadriplegia    GI/Hepatic   Endo/Other  Morbid obesity  Renal/GU      Musculoskeletal   Abdominal   Peds  Hematology  (+) Blood dyscrasia, anemia ,   Anesthesia Other Findings   Reproductive/Obstetrics                         Anesthesia Physical Anesthesia Plan  ASA: III  Anesthesia Plan: General   Post-op Pain Management:    Induction: Intravenous  Airway Management Planned: Oral ETT and Video Laryngoscope Planned  Additional Equipment:   Intra-op Plan:   Post-operative Plan:   Informed Consent: I have reviewed the patients History and Physical, chart, labs and discussed the procedure including the risks, benefits and alternatives for the proposed anesthesia with the patient or authorized representative who has indicated his/her understanding and acceptance.   Dental advisory given  Plan Discussed with: Anesthesiologist and Surgeon  Anesthesia Plan Comments:        Anesthesia Quick Evaluation

## 2012-05-14 NOTE — Progress Notes (Signed)
Pt off ward. Will see in AM

## 2012-05-14 NOTE — Progress Notes (Signed)
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  Date First Assessed/Time First Assessed: 05/04/12 2030   Location: Buttocks  Location Orientation: Left  Staging: Stage IV - Full thickness tissue loss with exposed bone, tendon or muscle.  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  Date First Assessed/Time First Assessed: 05/04/12 2030   Location: Buttocks  Location Orientation: Right  Staging: Stage IV - Full thickness tissue loss with exposed bone, tendon or muscle.  Present on Admission: Yes  State of Healing Early/partial granulation  Site / Wound Assessment Bleeding;Friable;Granulation tissue;Yellow  % Wound base Red or Granulating 75%  % Wound base Yellow 20%  % Wound base Black 0%  % Wound base Other (Comment) 5% (exposed tendon (necrotic))  Peri-wound Assessment Maceration  Margins Unattacted  edges (unapproximated)  Drainage Amount Moderate  Drainage Description Serosanguineous  Treatment Hydrotherapy (Pulse lavage);Packing (Saline gauze);Tape changed  Dressing Type ABD;Barrier Film (skin prep);Gauze (Comment);Moist to moist;Tape dressing (saline kerlix to pack upper buttock/hip; dry 4x4)  Dressing Clean;Dry;Intact;Changed  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  Date First Assessed/Time First Assessed: 05/04/12 2030   Location: Sacrum  Location Orientation: Medial  Staging: Stage IV - Full thickness tissue loss with exposed bone, tendon or muscle.  Present on Admission: Yes  State of Healing Fully granulated  Site / Wound Assessment Red  % Wound base Red or Granulating 100%  % Wound base Yellow 0%  % Wound base Black 0%  Peri-wound Assessment Maceration  Margins Unattacted edges (unapproximated)  Drainage Amount Scant  Drainage Description Serosanguineous  Treatment Hydrotherapy (Pulse lavage);Packing (Saline gauze);Tape changed  Dressing Type Barrier Film (skin prep);Gauze (Comment);ABD;Moist to moist;Tape dressing (2x2)  Dressing Changed;Clean;Dry;Intact  Hydrotherapy  Pulsed lavage therapy - wound location Rt buttock, Rt ischium, sacrum, Lt buttock  Pulsed Lavage with Suction (psi) (4-8psi)  Pulsed Lavage with Suction - Normal Saline Used 1000 mL  Pulsed Lavage Tip Tip with splash shield  Selective Debridement  Selective Debridement - Location Rt upper buttock, Rt lower buttock, lt buttock  Selective Debridement - Tools Used Scissors;Forceps  Selective Debridement - Tissue Removed adherent yellow necrotic tissue, necrotic tendon  Wound Therapy - Assess/Plan/Recommendations  Wound Therapy - Clinical Statement Pt with pressure wounds to bil buttocks and sacrum. Drainage and odor continues to show improvements. If drainage remains improved, rec d/c hydrotherapy in next 2-3 visits.   Wound Therapy - Functional  Problem List decreased skin integrity secondary to immobility  Factors Delaying/Impairing Wound  Healing Altered sensation;Diabetes Mellitus;Infection - systemic/local;Immobility;Multiple medical problems;Vascular compromise  Hydrotherapy Plan Debridement;Dressing change;Patient/family education;Pulsatile lavage with suction  Wound Therapy - Frequency 6X / week  Wound Therapy - Follow Up Recommendations Skilled nursing facility (pt may refuse; has failed wound management at home previousl)  Wound Plan Continue hydrotherapy as indicated  Wound Therapy Goals - Improve the function of patient's integumentary system by progressing the wound(s) through the phases of wound healing by:  Decrease Necrotic Tissue to 10%  Decrease Necrotic Tissue - Progress Progressing toward goal  Increase Granulation Tissue to 90%  Increase Granulation Tissue - Progress Progressing toward goal  Improve Drainage Characteristics Min  Improve Drainage Characteristics - Progress Progressing toward goal  Goals/treatment plan/discharge plan were made with and agreed upon by patient/family Yes  Time For Goal Achievement 7 days  Wound Therapy - Potential for Goals Fair     Charlotte Crumb, PT DPT  [phone]

## 2012-05-14 NOTE — Anesthesia Postprocedure Evaluation (Signed)
Anesthesia Post Note  Patient: [name] [name]  Procedure(s) Performed: Procedure(s) (LRB): IRRIGATION AND DEBRIDEMENT WOUND (Right)  Anesthesia type: general  Patient location: PACU  Post pain: Pain level controlled  Post assessment: Patient's Cardiovascular Status Stable  Last Vitals:  Filed Vitals:   05/14/12 1800  BP:   Pulse: 75  Temp: 36.3 C  Resp: 10    Post vital signs: Reviewed and stable  Level of consciousness: sedated  Complications: No apparent anesthesia complications

## 2012-05-15 ENCOUNTER — Encounter (HOSPITAL_COMMUNITY)
Admission: EM | Disposition: A | Discharge status: Home / Self Care | Payer: Self-pay | Source: Home / Self Care | Attending: Internal Medicine

## 2012-05-15 ENCOUNTER — Encounter (HOSPITAL_COMMUNITY): Payer: Self-pay | Admitting: Plastic Surgery

## 2012-05-15 HISTORY — PX: ESOPHAGOGASTRODUODENOSCOPY: SHX5428

## 2012-05-15 SURGERY — EGD (ESOPHAGOGASTRODUODENOSCOPY)
Anesthesia: Moderate Sedation

## 2012-05-15 MED ORDER — FENTANYL CITRATE 0.05 MG/ML IJ SOLN
INTRAMUSCULAR | Status: DC | PRN
  Administered 2012-05-15 (×2): 25 ug via INTRAVENOUS

## 2012-05-15 MED ORDER — FENTANYL CITRATE 0.05 MG/ML IJ SOLN
INTRAMUSCULAR | Status: AC
  Filled 2012-05-15: qty 4

## 2012-05-15 MED ORDER — MIDAZOLAM HCL 10 MG/2ML IJ SOLN
INTRAMUSCULAR | Status: DC | PRN
  Administered 2012-05-15: 1 mg via INTRAVENOUS
  Administered 2012-05-15 (×2): 2.5 mg via INTRAVENOUS

## 2012-05-15 MED ORDER — MIDAZOLAM HCL 5 MG/ML IJ SOLN
INTRAMUSCULAR | Status: AC
  Filled 2012-05-15: qty 3

## 2012-05-15 NOTE — Progress Notes (Signed)
[facility] PROGRESS NOTE  [name] [name] ZOX:[id number] DOB: 1965/09/03 DOA: 05/04/2012 PCP: [name], MD  Assessment/Plan: Principal Problem:  *Sepsis Active Problems:  OSA (obstructive sleep apnea)  Obesity  Quadriplegia  Seizure disorder  HTN (hypertension)  Sacral decubitus ulcer, stage IV  Recurrent UTI    46 year old quadriplegic African American male with history of chronic sacral diabetes ulcer stage IV and left knee wound, with diverting colostomy and chronic suprapubic catheter, who has had history of possible eye to send her current UTIs and soft tissue infection presents to the hospital with sepsis most likely due to infection around the sacral decubitus ulcer with osteomyelitis and possible abscess.  Patient follows with plastic surgeon Dr. Helyn Numbers, he was seen here by Dr. Rennis Chris orthopedic surgery who differed the case to Dr. Helyn Numbers, have discussed the case with Dr. Helyn Numbers who see the patient shortly. Patient is being seen by ID is on broad-spectrum antibiotic continue to monitor cultures. Currently nontoxic appearing.  1. Sepsis most likely due to infection from the chronic stage IV sacral decubitus ulcer and adjacent osteomyelitis with now multiple abscesses seen on MRI  Day # 11 of antbiotics  Day #11 vancomycin  Day 11 of meropenem  Per infectious disease the patient will need 6-8 weeks of IV antibiotics  - Blood cultures are negative to date, wound cultures growing staph aureus along with some gram-negative rods, urine culture grew Pseudomonas, will monitor culture and sensitivity, appreciate ID input, continue vancomycin along with meropenem, discussed the case with orthopedic surgeon Dr. Rennis Chris who recommended that case be discussed with Dr. Helyn Numbers plastic surgeon as it was too complex for his capabilities, I called Dr. Helyn Numbers on [phone], [phone] . she will see the patient shortly, Continue local wound care for his ulcers. Anticipate 6-8 weeks of  antibiotics, Irrigation and debridement of sacral ulcer for purpose of  preparation for ACell and VAC placement on 12/31. Awaiting culture  2. Quadriplegia - with colostomy, suprapubic catheter which is changed the 2 months, urologist Dr. Isabel Caprice. No acute issues continue supportive care.  3. History of recurrent UTIs - UTI versus colonization, noted cultures, antibiotics for #1 above will suffice.  4. Hypertension - blood pressure stable continue present regimen , discontinue Lasix  5. History of seizures - no acute issues we'll monitor clinically. He is not on any anti-seizure medications at home.  6. Low potassium - replace and check magnesium  7.anemia status post transfusion of 2 units of packed red blood cells  8. projectile vomiting/ileus he does have colostomy output, times one yesterday, abdominal KUB showed,Massive gastric gaseous distention which could reflect gastric outlet obstruction, gastroparesis, NG tube was replaced, npo for now . Gastroenterology Dr. Madilyn Fireman was consulted yesterday consult pending at this time  Code Status: Full  Family Communication: Discussed with the patient  Disposition Plan: To the OR for debridement next Monday, LTAC rehabilitation   Procedures MRI  Consults ID, orthopedics case discussed with Dr. Rennis Chris @ [phone] on 05/06/2012 he recommended to discuss it with Dr. Helyn Numbers, called plastic surgeon Dr. Helyn Numbers on 05/06/2012 @ [phone]  DVT Prophylaxis Lovenox    HPI/Subjective:  Nausea improved       Objective: Filed Vitals:   05/14/12 1800 05/14/12 1820 05/14/12 2100 05/15/12 0551  BP:  120/71 123/78 152/85  Pulse: 75  90 73  Temp: 97.4 F (36.3 C) 97.5 F (36.4 C) 98.4 F (36.9 C) 98.2 F (36.8 C)  TempSrc:    Oral  Resp: 10 16 18  18  Height:      Weight:      SpO2: 97%  99% 96%    Intake/Output Summary (Last 24 hours) at 05/15/12 0857 Last data filed at 05/15/12 1610  Gross per 24 hour  Intake   1848 ml  Output   2485 ml    Net   -637 ml    Exam: General: Alert and awake, oriented x3, morbidly obese he became diaphoretic while we did the exam of his wound  HEENT: anicteric sclera, pupils reactive to light and accommodation, EOMI, oropharynx clear and without exudate  CVS tachycardic rate, normal r, no murmur rubs or gallops  Chest: clear to auscultation bilaterally, no wheezing, rales or rhonchi  Abdomen: soft nontender, nondistended, his ostomy bag is full of brown stool  Skin: did not examine decubitus ulcer today  Neuro: Quadriplegic    Data Reviewed: Basic Metabolic Panel:  Lab 05/14/12 9604 05/12/12 0510 05/11/12 0503 05/10/12 0535 05/09/12 0530  NA 143 140 141 145 146*  K 3.8 3.8 3.6 3.5 3.7  CL 103 102 103 105 107  CO2 33* 31 31 33* 31  GLUCOSE 83 97 95 101* 109*  BUN 13 10 7 9 11   CREATININE 0.33* 0.40* 0.36* 0.41* 0.39*  CALCIUM 8.9 8.8 8.8 9.2 9.2  MG -- -- 2.1 -- --  PHOS -- -- -- -- --    Liver Function Tests:  Lab 05/14/12 1145  AST 14  ALT 11  ALKPHOS 64  BILITOT 0.2*  PROT 7.6  ALBUMIN 2.4*   No results found for this basename: LIPASE:5,AMYLASE:5 in the last 168 hours No results found for this basename: AMMONIA:5 in the last 168 hours  CBC:  Lab 05/14/12 1145 05/12/12 0510 05/10/12 0535 05/09/12 0530  WBC 10.0 14.6* 16.5* 18.4*  NEUTROABS -- -- -- --  HGB 9.0* 8.8* 8.7* 9.0*  HCT 28.3* 28.8* 28.3* 29.0*  MCV 78.6 78.7 78.0 77.3*  PLT 432* 466* 538* 535*    Cardiac Enzymes: No results found for this basename: CKTOTAL:5,CKMB:5,CKMBINDEX:5,TROPONINI:5 in the last 168 hours BNP (last 3 results) No results found for this basename: PROBNP:3 in the last 8760 hours   CBG: No results found for this basename: GLUCAP:5 in the last 168 hours  Recent Results (from the past 240 hour(s))  TISSUE CULTURE     Status: Normal (Preliminary result)   Collection Time   05/14/12  4:23 PM      Component Value Range Status Comment   Specimen Description TISSUE BONE   Final     Special Requests PATIENT ON FOLLOWING VANCOMYCIN NO 1 RIGHT ISHIUM   Final    Gram Stain     Final    Value: MODERATE WBC PRESENT,BOTH PMN AND MONONUCLEAR     NO ORGANISMS SEEN   Culture NO GROWTH   Final    Report Status PENDING   Incomplete   TISSUE CULTURE     Status: Normal (Preliminary result)   Collection Time   05/14/12  4:36 PM      Component Value Range Status Comment   Specimen Description TISSUE BONE   Final    Special Requests PATIENT ON FOLLOWING VANCOMYCIN NO 3 FEMUR   Final    Gram Stain PENDING   Incomplete    Culture NO GROWTH   Final    Report Status PENDING   Incomplete   TISSUE CULTURE     Status: Normal (Preliminary result)   Collection Time   05/14/12  5:00 PM  Component Value Range Status Comment   Specimen Description TISSUE BONE HIP RIGHT   Final    Special Requests PATIENT ON FOLLOWING VANCOMYCIN NO 2 POSTERIOR   Final    Gram Stain     Final    Value: NO WBC SEEN     NO ORGANISMS SEEN   Culture NO GROWTH   Final    Report Status PENDING   Incomplete   ANAEROBIC CULTURE     Status: Normal (Preliminary result)   Collection Time   05/14/12  5:00 PM      Component Value Range Status Comment   Specimen Description TISSUE BONE HIP RIGHT   Final    Special Requests PATIENT ON FOLLOWING VANCOMYCIN NO 2 POSTERIOR   Final    Gram Stain     Final    Value: NO WBC SEEN     NO ORGANISMS SEEN   Culture PENDING   Incomplete    Report Status PENDING   Incomplete      Studies: Dg Chest 2 View  04/22/2012  *RADIOLOGY REPORT*  Clinical Data: Chest pain and weakness.  CHEST - 2 VIEW  Comparison: 03/17/2012 and chest CT 10/26/2010  Findings: Two views of the chest were obtained.  Again noted are old left rib fractures.  In addition, there are chronic densities in the right upper chest related to the right scapula and posterior ribs based on previous CT.  Limited evaluation of the lung bases due to patient positioning and body habitus.  Heart and mediastinum appear  grossly stable.  Postsurgical changes in the lower cervical spine.  IMPRESSION: Limited examination without acute findings.  There are chronic densities in the right upper chest as described. Evidence of old left rib fractures.   Original Report Authenticated By: Richarda Overlie, M.D.    Dg Abd 1 View  05/11/2012  *RADIOLOGY REPORT*  Clinical Data: Ileus  ABDOMEN - 1 VIEW  Comparison: 05/10/2012  Findings: Tube tip remains in the stomach.  The stomach is distended.  Gas filled loops of small and large bowel are present without disproportionate dilatation.  Pelvic deformity is again noted.  IMPRESSION: Gastric distention has developed despite NG tube position in the stomach. Low wall suction may be helpful.  No evidence of small bowel obstruction or small bowel ileus.   Original Report Authenticated By: Jolaine Click, M.D.    Dg Abd 1 View  05/10/2012  *RADIOLOGY REPORT*  Clinical Data: Nausea  ABDOMEN - 1 VIEW  Comparison: Yesterday  Findings: NG tube placed.  Gastric distention has resolved.  No disproportionate dilatation of bowel. 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  IMPRESSION: Gastric distention resolved after NG tube placement.   Original Report Authenticated By: Jolaine Click, M.D.    Dg Abd 1 View  05/09/2012  *RADIOLOGY REPORT*  Clinical Data: Vomiting  ABDOMEN - 1 VIEW  Comparison: CT 02/29/2012  Findings: There is massive gaseous distention of the stomach to the approximate level of the duodenal bulb.  Remote fracture deformity with probable neuropathic change proximal right femur and pelvis, and changed.  No small or large bowel dilatation.  No free air accounting for supine technique.  IMPRESSION: Massive gastric gaseous distention which could reflect gastric outlet obstruction, gastroparesis, or ingested air less likely.   Original Report Authenticated By: Christiana Pellant, M.D.    Mr Pelvis W Wo  Contrast  05/04/2012  *RADIOLOGY REPORT*  Clinical Data:  Sacral decubitus ulcer with foul-smelling discharge. History of osteomyelitis treated with antibiotics.  MRI PELVIS WITHOUT AND WITH CONTRAST  Technique:  Multiplanar multisequence MR imaging of the pelvis was performed both before and after administration of intravenous contrast.  Contrast: 20mL MULTIHANCE GADOBENATE DIMEGLUMINE 529 MG/ML IV SOLN  Comparison: Pelvic CTs 02/29/2012 and 01/18/2012.  Findings: There is chronic dislocation of the right hip and chronic extensive fragmentation of the right femoral head and neck.  There is extensive heterotopic ossification surrounding the dislocated femoral head.  There is a deep decubitus ulcer posterior to the dislocated proximal femur which extends to the bone as on the prior CT. There are multiple peripherally enhancing fluid collections associated with the dislocated femoral head and lateral to the proximal femur.  The latter are incompletely imaged.  There is diffuse surrounding synovial and muscular enhancement. Edema and enhancement track along the right iliacus muscle.  There is irregular sclerosis, edema and enhancement within the proximal right femur.  Similar involvement is present within the right iliac bone and acetabulum.  There is chronic deformity of the right ischium.  The sacroiliac joints are chronically fused without adjacent marrow edema.  There is chronic deformity of the proximal left femur with surrounding heterotopic ossification.  The left femur demonstrates no suspicious marrow signal or enhancement.  The patient has a descending colostomy and indwelling bladder catheter.  Multiple prominent pelvic and inguinal lymph nodes are present, likely reactive and similar to prior CT.  No intrapelvic fluid collections are identified.  IMPRESSION:  1.  Chronic dislocation of the right hip and fragmentation of the proximal right femur are similar to prior CT.  Comparison between modalities is  limited. 2.  Sclerosis, edema and enhancement within the proximal femur and adjacent acetabulum are consistent with chronic osteomyelitis. Superimposed acute osteomyelitis is likely. 3.  Extensive inflammatory changes surrounding the right hip with large irregular peripherally enhancing fluid collections, synovial enhancement and muscular enhancement adjacent to a large decubitus ulcer.  These findings are consistent with chronic soft tissue infection with abscess formation.   Original Report Authenticated By: Carey Bullocks, M.D.    Ir Fluoro Guide Cv Line Right  04/22/2012  *RADIOLOGY REPORT*  Clinical Data: 46 year old with sepsis.  PICC LINE PLACEMENT WITH ULTRASOUND AND FLUOROSCOPIC  GUIDANCE  Fluoroscopy Time: 1.1 minutes.  The right arm was prepped with chlorhexidine, draped in the usual sterile fashion using maximum barrier technique (cap and mask, sterile gown, sterile gloves, large sterile sheet, hand hygiene and cutaneous antisepsis) and infiltrated locally with 1% Lidocaine.  Ultrasound demonstrated patency of the right brachial vein, and this was documented with an image.  Under real-time ultrasound guidance, this vein was accessed with a 21 gauge micropuncture needle and image documentation was performed.  The needle was exchanged over a guidewire for a peel-away sheath through which a five [place] dual lumen PICC trimmed to 52 cm was advanced, positioned with its tip at the lower SVC/right atrial junction. Fluoroscopy during the procedure and fluoro spot radiograph confirms appropriate catheter position.  The catheter was flushed, secured to the skin with Prolene sutures, and covered with a sterile dressing.  Complications:  None  IMPRESSION: Successful right arm PICC line placement with ultrasound and fluoroscopic guidance.  The catheter is ready for use.   Original Report Authenticated By: Richarda Overlie, M.D.    Ir US Guide Vasc Access Right  04/22/2012  *RADIOLOGY REPORT*  Clinical Data:  46 year old with sepsis.  PICC LINE PLACEMENT WITH ULTRASOUND AND FLUOROSCOPIC  GUIDANCE  Fluoroscopy Time: 1.1 minutes.  The right arm was prepped with chlorhexidine, draped in the usual sterile fashion using maximum barrier technique (cap and mask, sterile gown, sterile gloves, large sterile sheet, hand hygiene and cutaneous antisepsis) and infiltrated locally with 1% Lidocaine.  Ultrasound demonstrated patency of the right brachial vein, and this was documented with an image.  Under real-time ultrasound guidance, this vein was accessed with a 21 gauge micropuncture needle and image documentation was performed.  The needle was exchanged over a guidewire for a peel-away sheath through which a five [place] dual lumen PICC trimmed to 52 cm was advanced, positioned with its tip at the lower SVC/right atrial junction. Fluoroscopy during the procedure and fluoro spot radiograph confirms appropriate catheter position.  The catheter was flushed, secured to the skin with Prolene sutures, and covered with a sterile dressing.  Complications:  None  IMPRESSION: Successful right arm PICC line placement with ultrasound and fluoroscopic guidance.  The catheter is ready for use.   Original Report Authenticated By: Richarda Overlie, M.D.    Dg Chest Portable 1 View  05/04/2012  *RADIOLOGY REPORT*  Clinical Data: Congestion, chills  PORTABLE CHEST - 1 VIEW  Comparison: 04/22/2012, 10/26/2010 CT  Findings: Prominent cardiomediastinal contours. Multiple small calcific densities projecting over the right upper lung and scapula, likely correspond to post-traumatic changes described on prior CT. Mild dependent atelectasis.  No pleural effusion or pneumothorax.  Prior left rib fractures again noted.  Mild rightward thoracic spine curvature may be exaggerated by positioning. Right PICC tip projects over the proximal SVC.  IMPRESSION: Chronic changes as above.  Cardiomediastinal prominence is similar to prior.   Original Report Authenticated  By: Jearld Lesch, M.D.    Dg Abd Portable 1v  05/14/2012  *RADIOLOGY REPORT*  Clinical Data: Vomiting and abdominal distension.  PORTABLE ABDOMEN - 1 VIEW  Comparison: Abdominal radiographs, the most recent of which was performed 05/11/2012  Findings: There is recurrent massive distension of the stomach, following removal of the patient's enteric tube.  As significant air is seen within the first and second segment of the duodenum, this may reflect gastroparesis or less likely obstruction at the level of the duodenum.  The visualized bowel gas pattern is otherwise grossly unremarkable. No free intra-abdominal air is identified, though evaluation is limited on a supine views.  No acute osseous abnormalities are seen.  A small left pleural effusion is seen; mild bibasilar opacities likely reflect atelectasis.  IMPRESSION:  1.  Recurrent massive distension of the stomach, following removal of the patient's enteric tube.  As significant air is seen within the proximal duodenum, this may reflect gastroparesis, or less likely obstruction at the third segment of the duodenum.  Recommend immediate placement of enteric tube. 2.  Small left pleural effusion seen; mild bibasilar opacities likely reflect atelectasis.  These results were called by telephone on 05/13/2012 at 12:19 a.m. to Nursing on [facility], who verbally acknowledged these results.   Original Report Authenticated By: Tonia Ghent, M.D.     Scheduled Meds:   . baclofen  20 mg Oral QID  . docusate sodium  100 mg Oral BID  . enoxaparin  55 mg Subcutaneous Q24H  . famotidine  20 mg Oral BID  . lactose free nutrition  237 mL Oral BID BM  . meropenem (MERREM) IV  1 g Intravenous Q8H  . metoCLOPramide (REGLAN) injection  5 mg Intravenous Q8H  . nutrition supplement  1 packet Oral BID BM  . potassium  chloride SA  20 mEq Oral Daily  . vancomycin  1,500 mg Intravenous Q12H   Continuous Infusions:   . 0.9 % NaCl with KCl 20 mEq / L 50 mL/hr at  05/14/12 1124    Principal Problem:  *Sepsis Active Problems:  OSA (obstructive sleep apnea)  Obesity  Quadriplegia  Seizure disorder  HTN (hypertension)  Sacral decubitus ulcer, stage IV  Recurrent UTI    Time spent: 40 minutes   [facility]  [facility] Pager [phone]. If 8PM-8AM, please contact night-coverage at [email or web address], password [facility] 05/15/2012, 8:57 AM  LOS: 11 days
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
## 2012-05-15 NOTE — Progress Notes (Signed)
RT Note: Pt unable to wear CPAP tonight due to NG being placed. RT and RN will continue to monitor.

## 2012-05-15 NOTE — Op Note (Signed)
[name], [name] [name] NO.:  [id number]  MEDICAL RECORD NO.:  [id number]  LOCATION:  [facility] OR                       FACILITY:  [facility]  PHYSICIAN:  Tiwana Chavis Sanger, DO      DATE OF BIRTH:  14-Jul-1965  DATE OF PROCEDURE:  05/14/2012 DATE OF DISCHARGE:                              OPERATIVE REPORT   PREOPERATIVE DIAGNOSIS:  Sacral ulcer, stage IV.  POSTOPERATIVE DIAGNOSIS:  Sacral ulcer, stage IV.  PROCEDURE:  Irrigation and debridement of sacral ulcer for purpose of preparation for ACell and VAC placement, 11 x 8 x 6-cm skin and subcutaneous tissue film, ACell placement and VAC, ACell powder and sheet, 10 x 15 cm of sheet and 1000 g of powder.  ATTENDING SURGEON:  Wayland Denis, DO  ANESTHESIA:  General.  ASSISTANT:  Shawn Rayburn, P.A.  INDICATION FOR PROCEDURE:  The patient is a 46 year old gentleman who has process and unable to position himself, as a result he has a sacral ulcer that was down to bone.  He was admitted to the hospital with concerns of sepsis related to the ulceration.  Therefore, he was brought to the operating room for irrigation and debridement for purpose of preparation for ACell placement and VAC.  The patient was seen in holding.  Risks and complications were reviewed including bleeding, pain, scar, risk of anesthesia.  We discussed the ACell and the VAC placement, and he was in agreement.  He was taken to the operating room.  DESCRIPTION OF PROCEDURE:  Once in the operating room, the patient was intubated, general anesthesia was then administered.  The patient was then transferred to the operating room table in the prone position.  All bony prominences were padded.  A time-out was called and all information was confirmed to be correct.  He was prepped and draped in the usual sterile fashion.  A pair of tenotomies, the Bovie and 10-blade was used to debride the right ischial trochanteric ulcer, it was down to bone and included the  joint, bone and ligaments were tide.  Bone was sent for Gram stain, culture, and sensitivity.  The history as noted above. Rongeur was used to help debride the area as well.  Curette was used on the other two ulcerated areas that were more superficial.  Hemostasis was achieved with electrocautery.  The area was irrigated with copious amounts of normal saline and antibiotic solution.  The ACell powder of 1000 g was placed in the area followed by an ACell sheet.  The ACell sheet of 10 x 15 cm was prepared according to the manufacture guidelines.  An Adaptic was applied after the ACell was tacked in place with 5-0 Vicryl.  Surgilube was placed over the Adaptic followed by a VAC sponge, 100 mmHg of pressure.  Triple antibiotic ointment was placed on the other wounds.  The patient tolerated the procedure well.  There were no complications.  He was allowed to wake up and taken to recovery room in stable condition.     Wayland Denis, DO     CS/MEDQ  D:  05/14/2012  T:  05/15/2012  Job:  [id number]

## 2012-05-15 NOTE — Progress Notes (Signed)
PT HYDROTHERAPY PROGRESS NOTE  Attempted to see patient as per standing order; pt had surgery I & D performed yesterday with wound VAC and acell placement. Will hold treatment today and await clarification and updated hydrotherapy orders moving forward.    Charlotte Crumb, PT DPT  [phone]

## 2012-05-15 NOTE — Plan of Care (Signed)
Problem: Food- and Nutrition-Related Knowledge Deficit (NB-1.1) Goal: Nutrition education Formal process to instruct or train a patient/client in a skill or to impart knowledge to help patients/clients voluntarily manage or modify food choices and eating behavior to maintain or improve health. Outcome: Completed/Met Date Met:  05/15/12  RD consulted for education regarding High-Protein nutrition therapy for wound healing  RD provided "Pressure Ulcer and High-Protein Nutrition Therapy" handouts from the Academy of [employer]. Reviewed patient's dietary recall. Provided examples on ways to increase caloric density of foods and beverages frequently consumed by the patient. Also provided ideas to promote variety and to incorporate additional nutrient dense foods into patient's diet. Discussed eating small frequent meals and snacks to assist in increasing overall po intake. Teach back method used.  Expect good compliance.  RD to continue to follow nutrition care plan.  Jarold Motto MS, RD, LDN Pager: [phone] After-hours pager: [phone]

## 2012-05-15 NOTE — Brief Op Note (Signed)
05/04/2012 - 05/15/2012  2:28 PM  PATIENT:  [name]  46 y.o. male  PRE-OPERATIVE DIAGNOSIS:  gastric distention  POST-OPERATIVE DIAGNOSIS:  * No post-op diagnosis entered *  PROCEDURE:  Procedure(s) (LRB) with comments: ESOPHAGOGASTRODUODENOSCOPY (EGD) (N/A) - paraplegic  SURGEON:  Surgeon(s) and Role:    * Barrie Folk, MD - Primary  EGD performed. Esophagus showed some mild erosive esophagitis with some semi-confluent exudate distally, no stricture or definite ulcer  Stomach was dilated without any gross lesions. There was moderate fluid but no retained food. The pylorus appeared normal and easily allowed passage of the endoscope tip into the duodenum. The bulb and second portion both appeared somewhat dilated. There was no evidence of obstruction as far as the standard endoscope to pass.  Impression: Assume gastroparesis associated with more generalized ileus, no evidence of mechanical gastric or duodenal outlet obstruction.  Plan agree with IV metoclopramide which has been initiated and increased to 10 mg IV every 6 hours as needed. Also continue double dose PPI or PPI drip.

## 2012-05-15 NOTE — Op Note (Signed)
[facility] [address] [place], [address]   ENDOSCOPY PROCEDURE REPORT  PATIENT: [name] [name], [name] [name]  MR#: [id number] BIRTHDATE: 05/02/1966 , 46  yrs. old GENDER: Male ENDOSCOPIST:Harika Laidlaw Madilyn Fireman, MD REFERRED BY: PROCEDURE DATE:  05/15/2012 PROCEDURE: ASA CLASS: INDICATIONS: MEDICATION:  75 mcg fentanyl, Versed 6 mg TOPICAL ANESTHETIC:    Cetacaine  DESCRIPTION OF PROCEDURE:   esophagus: NG tube in place follow down with the scope. There were some mild distal erosive esophagitis with semi-confluent distal exudate no obvious ring stricture or ulcer.  Stomach was dilated with a small to moderate amount of fluid and some nasogastric trauma marks proximally. There was no retained solid food. The stomach did appear somewhat dilated. The antrum was nondeformed and the pylorus was round and under deformed as well. It easily allowed passage of the scope into the duodenum. The duodenum appeared somewhat dilated particularly in the bulb but also in the second portion. There is no evidence of intrinsic or extrinsic obstruction seen within the duodenum or pyloric area.     COMPLICATIONS: None  ENDOSCOPIC IMPRESSION:mild was of esophagitis. #2 somewhat dilated stomach consistent with prior radiographs, no retained food or evidence of gastric or duodenal outlet obstruction RECOMMENDATIONS:opinion G. suction and IV Reglan which has been initiated at 5 mg IV every 6 hours. Increased to 10 mg IV every 6 hours as needed and continue IV proton pump inhibitor.    _______________________________ Rosalie DoctorDorena Cookey, MD 05/15/2012 2:34 PM

## 2012-05-15 NOTE — Progress Notes (Signed)
PT refused his cpap, he has a nasogastric tube and he said it hurts to much to wear it

## 2012-05-16 LAB — CBC
MCH: 24 pg — ABNORMAL LOW (ref 26.0–34.0)
Platelets: 405 10*3/uL — ABNORMAL HIGH (ref 150–400)
RBC: 3.46 MIL/uL — ABNORMAL LOW (ref 4.22–5.81)
WBC: 9.3 10*3/uL (ref 4.0–10.5)

## 2012-05-16 LAB — VANCOMYCIN, TROUGH: Vancomycin Tr: 15.6 ug/mL (ref 10.0–20.0)
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
## 2012-05-16 NOTE — Progress Notes (Signed)
Patient ID: [name] [name], male   DOB: 05/01/1966, 47 y.o.   MRN: [id number] [facility] Progress Note  [name] [name] 47 y.o. 1965-06-30   Subjective: Reports one loose stool overnight. Feels less distended. Wants to know when he can eat. No complaints  Objective: Vital signs: Filed Vitals:   05/16/12 0601  BP: 129/63  Pulse: 91  Temp: 98.6 F (37 C)  Resp: 18   NG output: 1,150cc in last 24 hours  Physical Exam: Gen: alert, no acute distress  Abd: distended, nontender  Lab Results:  Basename 05/14/12 1145  NA 143  K 3.8  CL 103  CO2 33*  GLUCOSE 83  BUN 13  CREATININE 0.33*  CALCIUM 8.9  MG --  PHOS --    Basename 05/14/12 1145  AST 14  ALT 11  ALKPHOS 64  BILITOT 0.2*  PROT 7.6  ALBUMIN 2.4*    Basename 05/16/12 0550 05/14/12 1145  WBC 9.3 10.0  NEUTROABS -- --  HGB 8.3* 9.0*  HCT 26.9* 28.3*  MCV 77.7* 78.6  PLT 405* 432*      Assessment/Plan: Ileus - somewhat better clinically with BM and feeling of less distention but cloudy bilious drainage continues from NG tube. Will continue NPO status, NG drainage and current dose of IV Reglan and follow. Dr. Madilyn Fireman to see tomorrow.  [name] [name] C. 05/16/2012, 10:45 AM

## 2012-05-16 NOTE — Progress Notes (Signed)
Patient ID: [name] [name], male   DOB: October 25, 1965, 47 y.o.   MRN: [id number] [facility] PROGRESS NOTE  [name] [name] [id number] DOB: 22-Aug-1965 DOA: 05/04/2012 PCP: [name], MD  Brief narrative: Pt is 47 year old quadriplegic African American male with history of chronic sacral diabetes ulcer stage IV and left knee wound, with diverting colostomy and chronic suprapubic catheter, who has required an admission due to sepsis which was thought to be most likely secondary to sacral decubitus ulcer infection with osteomyelitis and possible abscess.    1. Sepsis most likely due to infection from the chronic stage IV sacral decubitus ulcer and adjacent osteomyelitis with now multiple abscesses seen on MRI  Day # 12 of antbiotics  Day #12 vancomycin and Meropenem  Per infectious disease the patient will need 6-8 weeks of IV antibiotics   Blood cultures are negative to date, wound cultures growing staph aureus along with gram - rods, urine culture + Pseudomonas  Appreciate ID input, continue current ABX as noted above  Pt is s/p Irrigation and debridement of sacral ulcer for purpose of preparation for ACell and VAC placement (done 12/30 by Dr. Kelly Splinter) 2. Quadriplegia - with colostomy, suprapubic catheter which is changed every 2 months, urologist Dr. Isabel Caprice. No acute issues continue supportive care.  3. History of recurrent UTIs - UTI versus colonization, noted cultures, antibiotics for #1 above will suffice.  4. Hypertension - blood pressure stable continue present regimen  5. History of seizures - no acute issues we'll monitor clinically. He is not on any anti-seizure medications at home.  6. Low potassium - supplemented adequately and within normal limits  7. Anemia status post transfusion of 2 units of packed red blood cells, will obtain CBC in AM 8. Projectile vomiting/ileus - with distension, now improved, GI following and we appreciate recommendations, NGT stil with bilious drain,  will keep NPO  Code Status: Full  Family Communication: Discussed with the patient  Disposition Plan: LTAC rehabilitation   Procedures MRI, I&d as noted above  Consults ID, plastic surgery, GI DVT Prophylaxis Lovenox   HPI/Subjective: No events overnight.   Objective: Filed Vitals:   05/15/12 1445 05/15/12 1450 05/15/12 2243 05/16/12 0601  BP: 145/96 156/88 120/68 129/63  Pulse:   91 91  Temp:   98.4 F (36.9 C) 98.6 F (37 C)  TempSrc:   Oral   Resp: 13 13 19 18   Height:      Weight:      SpO2: 99% 99% 99% 97%    Intake/Output Summary (Last 24 hours) at 05/16/12 1852 Last data filed at 05/16/12 1755  Gross per 24 hour  Intake      0 ml  Output   3220 ml  Net  -3220 ml    Exam:   General:  Pt is alert, follows commands appropriately, not in acute distress, NG tube in place  Cardiovascular: Regular rate and rhythm, S1/S2, no murmurs, no rubs, no gallops  Respiratory: Clear to auscultation bilaterally, no wheezing, no crackles, no rhonchi  Abdomen: Soft, non tender, mildly distended, bowel sounds present, no guarding, NGT with bilious drainage   Extremities: No edema, pulses DP and PT palpable bilaterally  Neuro: Grossly nonfocal  Data Reviewed: Basic Metabolic Panel:  Lab 05/14/12 5784 05/12/12 0510 05/11/12 0503 05/10/12 0535  NA 143 140 141 145  K 3.8 3.8 3.6 3.5  CL 103 102 103 105  CO2 33* 31 31 33*  GLUCOSE 83 97 95 101*  BUN 13 10 7  9  CREATININE 0.33* 0.40* 0.36* 0.41*  CALCIUM 8.9 8.8 8.8 9.2  MG -- -- 2.1 --  PHOS -- -- -- --   Liver Function Tests:  Lab 05/14/12 1145  AST 14  ALT 11  ALKPHOS 64  BILITOT 0.2*  PROT 7.6  ALBUMIN 2.4*   No results found for this basename: LIPASE:5,AMYLASE:5 in the last 168 hours No results found for this basename: AMMONIA:5 in the last 168 hours CBC:  Lab 05/16/12 0550 05/14/12 1145 05/12/12 0510 05/10/12 0535  WBC 9.3 10.0 14.6* 16.5*  NEUTROABS -- -- -- --  HGB 8.3* 9.0* 8.8* 8.7*  HCT 26.9*  28.3* 28.8* 28.3*  MCV 77.7* 78.6 78.7 78.0  PLT 405* 432* 466* 538*     Recent Results (from the past 240 hour(s))  TISSUE CULTURE     Status: Normal (Preliminary result)   Collection Time   05/14/12  4:23 PM      Component Value Range Status Comment   Specimen Description TISSUE BONE   Final    Special Requests PATIENT ON FOLLOWING VANCOMYCIN NO 1 RIGHT ISHIUM   Final    Gram Stain     Final    Value: MODERATE WBC PRESENT,BOTH PMN AND MONONUCLEAR     NO ORGANISMS SEEN   Culture FEW GRAM NEGATIVE RODS   Final    Report Status PENDING   Incomplete   ANAEROBIC CULTURE     Status: Normal (Preliminary result)   Collection Time   05/14/12  4:23 PM      Component Value Range Status Comment   Specimen Description TISSUE BONE   Final    Special Requests PATIENT ON FOLLOWING VANCOMYCIN NO 1 RIGHT ISHIUM   Final    Gram Stain PENDING   Incomplete    Culture     Final    Value: NO ANAEROBES ISOLATED; CULTURE IN PROGRESS FOR 5 DAYS   Report Status PENDING   Incomplete   TISSUE CULTURE     Status: Normal (Preliminary result)   Collection Time   05/14/12  4:36 PM      Component Value Range Status Comment   Specimen Description TISSUE BONE   Final    Special Requests PATIENT ON FOLLOWING VANCOMYCIN NO 3 FEMUR   Final    Gram Stain     Final    Value: NO WBC SEEN     NO ORGANISMS SEEN   Culture Culture reincubated for better growth   Final    Report Status PENDING   Incomplete   ANAEROBIC CULTURE     Status: Normal (Preliminary result)   Collection Time   05/14/12  4:36 PM      Component Value Range Status Comment   Specimen Description TISSUE BONE   Final    Special Requests PATIENT ON FOLLOWING VANCOMYCIN NO 3 FEMUR   Final    Gram Stain PENDING   Incomplete    Culture     Final    Value: NO ANAEROBES ISOLATED; CULTURE IN PROGRESS FOR 5 DAYS   Report Status PENDING   Incomplete   TISSUE CULTURE     Status: Normal (Preliminary result)   Collection Time   05/14/12  5:00 PM       Component Value Range Status Comment   Specimen Description TISSUE BONE HIP RIGHT   Final    Special Requests PATIENT ON FOLLOWING VANCOMYCIN NO 2 POSTERIOR   Final    Gram Stain     Final  Value: NO WBC SEEN     NO ORGANISMS SEEN   Culture Culture reincubated for better growth   Final    Report Status PENDING   Incomplete   ANAEROBIC CULTURE     Status: Normal (Preliminary result)   Collection Time   05/14/12  5:00 PM      Component Value Range Status Comment   Specimen Description TISSUE BONE HIP RIGHT   Final    Special Requests PATIENT ON FOLLOWING VANCOMYCIN NO 2 POSTERIOR   Final    Gram Stain     Final    Value: NO WBC SEEN     NO ORGANISMS SEEN   Culture     Final    Value: NO ANAEROBES ISOLATED; CULTURE IN PROGRESS FOR 5 DAYS   Report Status PENDING   Incomplete      Scheduled Meds:   . baclofen  20 mg Oral QID  . docusate sodium  100 mg Oral BID  . enoxaparin  55 mg Subcutaneous Q24H  . famotidine  20 mg Oral BID  . lactose free nutrition  237 mL Oral BID BM  . meropenem (MERREM) IV  1 g Intravenous Q8H  . metoCLOPramide (REGLAN) injection  5 mg Intravenous Q8H  . nutrition supplement  1 packet Oral BID BM  . potassium chloride SA  20 mEq Oral Daily  . vancomycin  1,500 mg Intravenous Q12H   Continuous Infusions:   . 0.9 % NaCl with KCl 20 mEq / L 50 mL/hr at 05/16/12 1315     Debbora Presto, MD  [facility] Pager [phone]  If 7PM-7AM, please contact night-coverage [email or web address] Password [email or web address] 05/16/2012, 6:52 PM   LOS: 12 days

## 2012-05-16 NOTE — Progress Notes (Signed)
PT Hydrotherapy Note:   05/16/12 1102  Subjective Assessment  Subjective Pt agreeable to hydrotherapy to continue with left buttock and sacral/crevice wounds.  Patient and Family Stated Goals heal wounds  Date of Onset (PTA)  Prior Treatments Previous wound care; hydro therapy; dressing changes with home health  Evaluation and Treatment  Evaluation and Treatment Procedures Explained to Patient/Family Yes  Evaluation and Treatment Procedures agreed to  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  Date First Assessed/Time First Assessed: 05/04/12 2030   Location: Buttocks  Location Orientation: Left  Staging: Stage IV - Full thickness tissue loss with exposed bone, tendon or muscle.  Present on Admission: Yes  State of Healing Early/partial granulation  Site / Wound Assessment Pink;Yellow  % Wound base Red or Granulating 90%  % Wound base Yellow 10%  % Wound base Black 0%  Peri-wound Assessment Maceration  Margins Unattacted edges (unapproximated)  Drainage Amount Scant  Drainage Description Serosanguineous  Treatment Hydrotherapy (Pulse lavage);Debridement (Selective);Packing (Saline gauze);Tape changed  Dressing Type ABD;Gauze (Comment);Moist to moist;Barrier Film (skin prep);Tape dressing (NS Moist to Moist 4x4, dry 4x4, abd, skin prep, tape)  Dressing Changed  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  Date First Assessed/Time First Assessed: 05/04/12 2030   Location: Sacrum  Location Orientation: Medial  Staging: Stage IV - Full thickness tissue loss with exposed bone, tendon or muscle.  Present on Admission: Yes  State of Healing Fully granulated  Site / Wound Assessment Red  % Wound base Red or Granulating 100%  % Wound base Yellow 0%  % Wound base Black 0%  Peri-wound Assessment Maceration  Margins Unattacted edges (unapproximated)  Drainage Amount Scant  Drainage  Description Serosanguineous  Treatment Hydrotherapy (Pulse lavage);Packing (Saline gauze);Tape changed  Dressing Type ABD;Barrier Film (skin prep);Gauze (Comment);Moist to moist;Liquid skin adhesive (NS Moist to Moist 4x4, dry 4x4, abd, skin prep, tape)  Dressing Changed  Hydrotherapy  Pulsed lavage therapy - wound location Sacrum and left buttock. (Right buttock with VAC currently in place to be changed 1/6.)  Pulsed Lavage with Suction (psi) 4 psi  Pulsed Lavage with Suction - Normal Saline Used 1000 mL  Pulsed Lavage Tip Tip with splash shield  Selective Debridement  Selective Debridement - Location Sacrum and left buttock.  Selective Debridement - Tools Used Scissors;Forceps  Selective Debridement - Tissue Removed Adherent yellow necrotic tissue and slough.  Wound Therapy - Assess/Plan/Recommendations  Wound Therapy - Clinical Statement Pt admitted with chronic pressure wound ulcers to bilateral buttock and sacrum. Pt to OR on 12/30 for VAC placement to right buttock with order to change in one week. Through discussion with RN, plan to continue with hydrotherapy to sacrum and left buttock as previously ordered. Area very granulated this am with only about 10% yellow necrotic tissue noted in left buttock wounds. Will follow up 05/17/12 with hydrotherapy and possibly d/c pt from hydrotherapy if wound continues to be granulated and stable. Please re-order for right buttock wound if needed after VAC change on 05/21/12.   Wound Therapy - Functional Problem List decreased skin integrity secondary to immobility causing increased change of infection.  Factors Delaying/Impairing Wound Healing Altered sensation;Diabetes Mellitus;Infection - systemic/local;Immobility;Multiple medical problems;Vascular compromise  Hydrotherapy Plan Debridement;Dressing change;Patient/family education;Pulsatile lavage with suction  Wound Therapy - Frequency 6X / week  Wound Therapy - Follow Up Recommendations Skilled nursing  facility (pt may refuse; has failed wound management  at home previousl)  Wound Plan Continue hydrotherapy as indicated. Possible d/c 05/17/12. No further treatment to right buttock due to [facility] currently in place.  Wound Therapy Goals - Improve the function of patient's integumentary system by progressing the wound(s) through the phases of wound healing by:  Decrease Necrotic Tissue to 0  Decrease Necrotic Tissue - Progress Updated due to goal met  Increase Granulation Tissue to 100  Increase Granulation Tissue - Progress Updated due to goal met  Improve Drainage Characteristics Serous  Improve Drainage Characteristics - Progress Updated due to goal met  Goals/treatment plan/discharge plan were made with and agreed upon by patient/family Yes  Time For Goal Achievement 7 days  Wound Therapy - Potential for Goals Fair     05/16/2012 Cephus Shelling, PT, DPT [phone]

## 2012-05-16 NOTE — Progress Notes (Deleted)
PT Hydrotherapy Note:  Noted order from 05/14/12 stating VAC change in one week. Will sign off hydrotherapy at this point. Please reorder next week if hydrotherapy still required to help facilitate wound healing. Thanks!  05/16/2012 Cephus Shelling, PT, DPT [phone]

## 2012-05-16 NOTE — Progress Notes (Signed)
PT Hydrotherapy Note:  Noted order from 05/14/12 stating VAC change in one week. VAC only applied to right buttock per RN. Will continue to follow pt with hydrotherapy to left buttock and sacrum. Please continue to advise if hydrotherapy required in one week for right buttock. Thanks!  05/16/2012 Cephus Shelling, PT, DPT [phone]

## 2012-05-17 ENCOUNTER — Inpatient Hospital Stay (HOSPITAL_COMMUNITY): Payer: Medicare Other

## 2012-05-17 ENCOUNTER — Encounter (HOSPITAL_COMMUNITY): Payer: Self-pay | Admitting: Gastroenterology

## 2012-05-17 LAB — TISSUE CULTURE

## 2012-05-17 LAB — CBC
Hemoglobin: 8.3 g/dL — ABNORMAL LOW (ref 13.0–17.0)
MCHC: 31.1 g/dL (ref 30.0–36.0)

## 2012-05-17 LAB — BASIC METABOLIC PANEL
GFR calc Af Amer: >90 mL/min (ref >90)
GFR calc non Af Amer: >90 mL/min (ref >90)
Glucose, Bld: 71 mg/dL (ref 70–99)
Potassium: 3.4 mEq/L — ABNORMAL LOW (ref 3.5–5.1)
Sodium: 143 mEq/L (ref 135–145)

## 2012-05-17 LAB — GLUCOSE, CAPILLARY: Glucose-Capillary: 85 mg/dL (ref 70–99)

## 2012-05-17 MED ORDER — DEXTROSE 5 % IV SOLN
2.0000 g | Freq: Three times a day (TID) | INTRAVENOUS | Status: DC
  Administered 2012-05-17 – 2012-05-22 (×15): 2 g via INTRAVENOUS
  Filled 2012-05-17 (×19): qty 2

## 2012-05-17 MED ORDER — METOCLOPRAMIDE HCL 5 MG/ML IJ SOLN
10.0000 mg | Freq: Four times a day (QID) | INTRAMUSCULAR | Status: DC
  Administered 2012-05-17 – 2012-05-22 (×21): 10 mg via INTRAVENOUS
  Filled 2012-05-17 (×26): qty 2

## 2012-05-17 NOTE — Progress Notes (Signed)
[facility] PROGRESS NOTE  Tzvi Economou EAV:[id number] DOB: 23-Sep-1965 DOA: 05/04/2012 PCP: [name], MD  Assessment/Plan: Principal Problem:  *Sepsis Active Problems:  OSA (obstructive sleep apnea)  Obesity  Quadriplegia  Seizure disorder  HTN (hypertension)  Sacral decubitus ulcer, stage IV  Recurrent UTI    Brief narrative:  Pt is 47 year old quadriplegic African American male with history of chronic sacral diabetes ulcer stage IV and left knee wound, with diverting colostomy and chronic suprapubic catheter, who has required an admission due to sepsis which was thought to be most likely secondary to sacral decubitus ulcer infection with osteomyelitis and possible abscess.  1. Sepsis most likely due to infection from the chronic stage IV sacral decubitus ulcer and adjacent osteomyelitis with now multiple abscesses seen on MRI  Day # 12 of antbiotics  Day #12 vancomycin and Meropenem will be switched to ceftazidime as wound culture is growing Pseudomonas Per infectious disease the patient will need 6-8 weeks of IV antibiotics  Blood cultures are negative to date, wound cultures growing staph aureus along with gram - rods, urine culture + Pseudomonas  Appreciate ID input, continue current ABX as noted above Pt is s/p Irrigation and debridement of sacral ulcer for purpose of preparation for ACell and VAC placement (done 12/30 by Dr. Kelly Splinter) continue hydrotherapy 2. Quadriplegia - with colostomy, suprapubic catheter which is changed every 2 months, urologist Dr. Isabel Caprice. No acute issues continue supportive care.  3. History of recurrent UTIs - UTI versus colonization, noted cultures, antibiotics for #1 above will suffice.  4. Hypertension - blood pressure stable continue present regimen  5. History of seizures - no acute issues we'll monitor clinically. He is not on any anti-seizure medications at home.  6. Low potassium - supplemented adequately and within normal limits  7.  Anemia status post transfusion of 2 units of packed red blood cells, will obtain CBC in AM  8. Projectile vomiting/ileus - with distension, now improved, GI following and we appreciate recommendations, NGT stil with bilious drain, will keep NPO , GI to advance the patient's diet  Code Status: Full  Family Communication: Discussed with the patient  Disposition Plan: LTAC rehabilitation  Procedures MRI, I&d as noted above  Consults ID, plastic surgery, GI  DVT Prophylaxis Lovenox    HPI/Subjective:  No events overnight.    Objective: Filed Vitals:   05/16/12 0601 05/16/12 2158 05/16/12 2200 05/17/12 0613  BP: 129/63 162/84 140/70 140/74  Pulse: 91 82  89  Temp: 98.6 F (37 C) 98.4 F (36.9 C)  98.2 F (36.8 C)  TempSrc:  Oral  Oral  Resp: 18 19  18   Height:      Weight:      SpO2: 97% 100%  100%    Intake/Output Summary (Last 24 hours) at 05/17/12 0839 Last data filed at 05/17/12 7829  Gross per 24 hour  Intake   1540 ml  Output   2625 ml  Net  -1085 ml    Exam:  HENT:  Head: Atraumatic.  Nose: Nose normal.  Mouth/Throat: Oropharynx is clear and moist.  Eyes: Conjunctivae are normal. Pupils are equal, round, and reactive to light. No scleral icterus.  Neck: Neck supple. No tracheal deviation present.  Cardiovascular: Normal rate, regular rhythm, normal heart sounds and intact distal pulses.  Pulmonary/Chest: Effort normal and breath sounds normal. No respiratory distress.  Abdominal: Soft. Normal appearance and bowel sounds are normal. She exhibits no distension. There is no tenderness.  Musculoskeletal: She exhibits no  edema and no tenderness.  Neurological: She is alert. No cranial nerve deficit.    Data Reviewed: Basic Metabolic Panel:  Lab 05/17/12 9562 05/14/12 1145 05/12/12 0510 05/11/12 0503  NA 143 143 140 141  K 3.4* 3.8 3.8 3.6  CL 101 103 102 103  CO2 28 33* 31 31  GLUCOSE 71 83 97 95  BUN 6 13 10 7   CREATININE 0.30* 0.33* 0.40* 0.36*  CALCIUM  9.0 8.9 8.8 8.8  MG -- -- -- 2.1  PHOS -- -- -- --    Liver Function Tests:  Lab 05/14/12 1145  AST 14  ALT 11  ALKPHOS 64  BILITOT 0.2*  PROT 7.6  ALBUMIN 2.4*   No results found for this basename: LIPASE:5,AMYLASE:5 in the last 168 hours No results found for this basename: AMMONIA:5 in the last 168 hours  CBC:  Lab 05/17/12 0455 05/16/12 0550 05/14/12 1145 05/12/12 0510  WBC 11.0* 9.3 10.0 14.6*  NEUTROABS -- -- -- --  HGB 8.3* 8.3* 9.0* 8.8*  HCT 26.7* 26.9* 28.3* 28.8*  MCV 78.5 77.7* 78.6 78.7  PLT 380 405* 432* 466*    Cardiac Enzymes: No results found for this basename: CKTOTAL:5,CKMB:5,CKMBINDEX:5,TROPONINI:5 in the last 168 hours BNP (last 3 results) No results found for this basename: PROBNP:3 in the last 8760 hours   CBG: No results found for this basename: GLUCAP:5 in the last 168 hours  Recent Results (from the past 240 hour(s))  TISSUE CULTURE     Status: Normal (Preliminary result)   Collection Time   05/14/12  4:23 PM      Component Value Range Status Comment   Specimen Description TISSUE BONE   Final    Special Requests PATIENT ON FOLLOWING VANCOMYCIN NO 1 RIGHT ISHIUM   Final    Gram Stain     Final    Value: MODERATE WBC PRESENT,BOTH PMN AND MONONUCLEAR     NO ORGANISMS SEEN   Culture FEW GRAM NEGATIVE RODS   Final    Report Status PENDING   Incomplete   ANAEROBIC CULTURE     Status: Normal (Preliminary result)   Collection Time   05/14/12  4:23 PM      Component Value Range Status Comment   Specimen Description TISSUE BONE   Final    Special Requests PATIENT ON FOLLOWING VANCOMYCIN NO 1 RIGHT ISHIUM   Final    Gram Stain PENDING   Incomplete    Culture     Final    Value: NO ANAEROBES ISOLATED; CULTURE IN PROGRESS FOR 5 DAYS   Report Status PENDING   Incomplete   TISSUE CULTURE     Status: Normal (Preliminary result)   Collection Time   05/14/12  4:36 PM      Component Value Range Status Comment   Specimen Description TISSUE BONE    Final    Special Requests PATIENT ON FOLLOWING VANCOMYCIN NO 3 FEMUR   Final    Gram Stain     Final    Value: NO WBC SEEN     NO ORGANISMS SEEN   Culture Culture reincubated for better growth   Final    Report Status PENDING   Incomplete   ANAEROBIC CULTURE     Status: Normal (Preliminary result)   Collection Time   05/14/12  4:36 PM      Component Value Range Status Comment   Specimen Description TISSUE BONE   Final    Special Requests PATIENT ON FOLLOWING VANCOMYCIN NO  3 FEMUR   Final    Gram Stain PENDING   Incomplete    Culture     Final    Value: NO ANAEROBES ISOLATED; CULTURE IN PROGRESS FOR 5 DAYS   Report Status PENDING   Incomplete   TISSUE CULTURE     Status: Normal (Preliminary result)   Collection Time   05/14/12  5:00 PM      Component Value Range Status Comment   Specimen Description TISSUE BONE HIP RIGHT   Final    Special Requests PATIENT ON FOLLOWING VANCOMYCIN NO 2 POSTERIOR   Final    Gram Stain     Final    Value: NO WBC SEEN     NO ORGANISMS SEEN   Culture Culture reincubated for better growth   Final    Report Status PENDING   Incomplete   ANAEROBIC CULTURE     Status: Normal (Preliminary result)   Collection Time   05/14/12  5:00 PM      Component Value Range Status Comment   Specimen Description TISSUE BONE HIP RIGHT   Final    Special Requests PATIENT ON FOLLOWING VANCOMYCIN NO 2 POSTERIOR   Final    Gram Stain     Final    Value: NO WBC SEEN     NO ORGANISMS SEEN   Culture     Final    Value: NO ANAEROBES ISOLATED; CULTURE IN PROGRESS FOR 5 DAYS   Report Status PENDING   Incomplete      Studies: Dg Chest 2 View  04/22/2012  *RADIOLOGY REPORT*  Clinical Data: Chest pain and weakness.  CHEST - 2 VIEW  Comparison: 03/17/2012 and chest CT 10/26/2010  Findings: Two views of the chest were obtained.  Again noted are old left rib fractures.  In addition, there are chronic densities in the right upper chest related to the right scapula and posterior ribs  based on previous CT.  Limited evaluation of the lung bases due to patient positioning and body habitus.  Heart and mediastinum appear grossly stable.  Postsurgical changes in the lower cervical spine.  IMPRESSION: Limited examination without acute findings.  There are chronic densities in the right upper chest as described. Evidence of old left rib fractures.   Original Report Authenticated By: Richarda Overlie, M.D.    Dg Abd 1 View  05/11/2012  *RADIOLOGY REPORT*  Clinical Data: Ileus  ABDOMEN - 1 VIEW  Comparison: 05/10/2012  Findings: Tube tip remains in the stomach.  The stomach is distended.  Gas filled loops of small and large bowel are present without disproportionate dilatation.  Pelvic deformity is again noted.  IMPRESSION: Gastric distention has developed despite NG tube position in the stomach. Low wall suction may be helpful.  No evidence of small bowel obstruction or small bowel ileus.   Original Report Authenticated By: Jolaine Click, M.D.    Dg Abd 1 View  05/10/2012  *RADIOLOGY REPORT*  Clinical Data: Nausea  ABDOMEN - 1 VIEW  Comparison: Yesterday  Findings: NG tube placed.  Gastric distention has resolved.  No disproportionate dilatation of bowel. 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  IMPRESSION: Gastric distention resolved after NG tube placement.   Original Report Authenticated By: Jolaine Click, M.D.    Dg Abd 1 View  05/09/2012  *RADIOLOGY REPORT*  Clinical Data: Vomiting  ABDOMEN - 1 VIEW  Comparison: CT 02/29/2012  Findings: There is massive  gaseous distention of the stomach to the approximate level of the duodenal bulb.  Remote fracture deformity with probable neuropathic change proximal right femur and pelvis, and changed.  No small or large bowel dilatation.  No free air accounting for supine technique.  IMPRESSION: Massive gastric gaseous distention which could reflect gastric outlet obstruction,  gastroparesis, or ingested air less likely.   Original Report Authenticated By: Christiana Pellant, M.D.    Mr Pelvis W Wo Contrast  05/04/2012  *RADIOLOGY REPORT*  Clinical Data: Sacral decubitus ulcer with foul-smelling discharge. History of osteomyelitis treated with antibiotics.  MRI PELVIS WITHOUT AND WITH CONTRAST  Technique:  Multiplanar multisequence MR imaging of the pelvis was performed both before and after administration of intravenous contrast.  Contrast: 20mL MULTIHANCE GADOBENATE DIMEGLUMINE 529 MG/ML IV SOLN  Comparison: Pelvic CTs 02/29/2012 and 01/18/2012.  Findings: There is chronic dislocation of the right hip and chronic extensive fragmentation of the right femoral head and neck.  There is extensive heterotopic ossification surrounding the dislocated femoral head.  There is a deep decubitus ulcer posterior to the dislocated proximal femur which extends to the bone as on the prior CT. There are multiple peripherally enhancing fluid collections associated with the dislocated femoral head and lateral to the proximal femur.  The latter are incompletely imaged.  There is diffuse surrounding synovial and muscular enhancement. Edema and enhancement track along the right iliacus muscle.  There is irregular sclerosis, edema and enhancement within the proximal right femur.  Similar involvement is present within the right iliac bone and acetabulum.  There is chronic deformity of the right ischium.  The sacroiliac joints are chronically fused without adjacent marrow edema.  There is chronic deformity of the proximal left femur with surrounding heterotopic ossification.  The left femur demonstrates no suspicious marrow signal or enhancement.  The patient has a descending colostomy and indwelling bladder catheter.  Multiple prominent pelvic and inguinal lymph nodes are present, likely reactive and similar to prior CT.  No intrapelvic fluid collections are identified.  IMPRESSION:  1.  Chronic dislocation of  the right hip and fragmentation of the proximal right femur are similar to prior CT.  Comparison between modalities is limited. 2.  Sclerosis, edema and enhancement within the proximal femur and adjacent acetabulum are consistent with chronic osteomyelitis. Superimposed acute osteomyelitis is likely. 3.  Extensive inflammatory changes surrounding the right hip with large irregular peripherally enhancing fluid collections, synovial enhancement and muscular enhancement adjacent to a large decubitus ulcer.  These findings are consistent with chronic soft tissue infection with abscess formation.   Original Report Authenticated By: Carey Bullocks, M.D.    Ir Fluoro Guide Cv Line Right  04/22/2012  *RADIOLOGY REPORT*  Clinical Data: 47 year old with sepsis.  PICC LINE PLACEMENT WITH ULTRASOUND AND FLUOROSCOPIC  GUIDANCE  Fluoroscopy Time: 1.1 minutes.  The right arm was prepped with chlorhexidine, draped in the usual sterile fashion using maximum barrier technique (cap and mask, sterile gown, sterile gloves, large sterile sheet, hand hygiene and cutaneous antisepsis) and infiltrated locally with 1% Lidocaine.  Ultrasound demonstrated patency of the right brachial vein, and this was documented with an image.  Under real-time ultrasound guidance, this vein was accessed with a 21 gauge micropuncture needle and image documentation was performed.  The needle was exchanged over a guidewire for a peel-away sheath through which a five [place] dual lumen PICC trimmed to 52 cm was advanced, positioned with its tip at the lower SVC/right atrial junction. Fluoroscopy during the procedure and fluoro  spot radiograph confirms appropriate catheter position.  The catheter was flushed, secured to the skin with Prolene sutures, and covered with a sterile dressing.  Complications:  None  IMPRESSION: Successful right arm PICC line placement with ultrasound and fluoroscopic guidance.  The catheter is ready for use.   Original Report  Authenticated By: Richarda Overlie, M.D.    Ir US Guide Vasc Access Right  04/22/2012  *RADIOLOGY REPORT*  Clinical Data: 47 year old with sepsis.  PICC LINE PLACEMENT WITH ULTRASOUND AND FLUOROSCOPIC  GUIDANCE  Fluoroscopy Time: 1.1 minutes.  The right arm was prepped with chlorhexidine, draped in the usual sterile fashion using maximum barrier technique (cap and mask, sterile gown, sterile gloves, large sterile sheet, hand hygiene and cutaneous antisepsis) and infiltrated locally with 1% Lidocaine.  Ultrasound demonstrated patency of the right brachial vein, and this was documented with an image.  Under real-time ultrasound guidance, this vein was accessed with a 21 gauge micropuncture needle and image documentation was performed.  The needle was exchanged over a guidewire for a peel-away sheath through which a five [place] dual lumen PICC trimmed to 52 cm was advanced, positioned with its tip at the lower SVC/right atrial junction. Fluoroscopy during the procedure and fluoro spot radiograph confirms appropriate catheter position.  The catheter was flushed, secured to the skin with Prolene sutures, and covered with a sterile dressing.  Complications:  None  IMPRESSION: Successful right arm PICC line placement with ultrasound and fluoroscopic guidance.  The catheter is ready for use.   Original Report Authenticated By: Richarda Overlie, M.D.    Dg Chest Portable 1 View  05/04/2012  *RADIOLOGY REPORT*  Clinical Data: Congestion, chills  PORTABLE CHEST - 1 VIEW  Comparison: 04/22/2012, 10/26/2010 CT  Findings: Prominent cardiomediastinal contours. Multiple small calcific densities projecting over the right upper lung and scapula, likely correspond to post-traumatic changes described on prior CT. Mild dependent atelectasis.  No pleural effusion or pneumothorax.  Prior left rib fractures again noted.  Mild rightward thoracic spine curvature may be exaggerated by positioning. Right PICC tip projects over the proximal SVC.   IMPRESSION: Chronic changes as above.  Cardiomediastinal prominence is similar to prior.   Original Report Authenticated By: Jearld Lesch, M.D.    Dg Abd Portable 1v  05/14/2012  *RADIOLOGY REPORT*  Clinical Data: Vomiting and abdominal distension.  PORTABLE ABDOMEN - 1 VIEW  Comparison: Abdominal radiographs, the most recent of which was performed 05/11/2012  Findings: There is recurrent massive distension of the stomach, following removal of the patient's enteric tube.  As significant air is seen within the first and second segment of the duodenum, this may reflect gastroparesis or less likely obstruction at the level of the duodenum.  The visualized bowel gas pattern is otherwise grossly unremarkable. No free intra-abdominal air is identified, though evaluation is limited on a supine views.  No acute osseous abnormalities are seen.  A small left pleural effusion is seen; mild bibasilar opacities likely reflect atelectasis.  IMPRESSION:  1.  Recurrent massive distension of the stomach, following removal of the patient's enteric tube.  As significant air is seen within the proximal duodenum, this may reflect gastroparesis, or less likely obstruction at the third segment of the duodenum.  Recommend immediate placement of enteric tube. 2.  Small left pleural effusion seen; mild bibasilar opacities likely reflect atelectasis.  These results were called by telephone on 05/13/2012 at 12:19 a.m. to Nursing on [facility]N, who verbally acknowledged these results.   Original Report Authenticated By:  Tonia Ghent, M.D.     Scheduled Meds:   . baclofen  20 mg Oral QID  . docusate sodium  100 mg Oral BID  . enoxaparin  55 mg Subcutaneous Q24H  . famotidine  20 mg Oral BID  . lactose free nutrition  237 mL Oral BID BM  . meropenem (MERREM) IV  1 g Intravenous Q8H  . metoCLOPramide (REGLAN) injection  10 mg Intravenous Q6H  . nutrition supplement  1 packet Oral BID BM  . potassium chloride SA  20 mEq Oral Daily   . vancomycin  1,500 mg Intravenous Q12H   Continuous Infusions:   . 0.9 % NaCl with KCl 20 mEq / L 50 mL/hr at 05/17/12 4696    Principal Problem:  *Sepsis Active Problems:  OSA (obstructive sleep apnea)  Obesity  Quadriplegia  Seizure disorder  HTN (hypertension)  Sacral decubitus ulcer, stage IV  Recurrent UTI    Time spent: 40 minutes   [facility]  [facility] Pager [phone]. If 8PM-8AM, please contact night-coverage at [email or web address], password [facility] 05/17/2012, 8:39 AM  LOS: 13 days

## 2012-05-17 NOTE — Progress Notes (Signed)
Physical Therapy Wound Treatment Patient Details  Name: [name] [name] MRN: [id number] Date of Birth: 1965-08-10  Today's Date: 05/17/2012 Time: 0826-0905 Time Calculation (min): 39 min  Subjective  Subjective: Pt agreeable to hydrotherapy to continue with left buttock and sacral/crevice wounds. Patient and Family Stated Goals: heal wounds Date of Onset:  (PTA) Prior Treatments: Previous wound care; hydro therapy; dressing changes with home health  Pain Score: Pain Score: 0-No pain  Wound Assessment  Pressure Ulcer 07/20/11 (Active)     Pressure Ulcer 01/22/12 Deep Tissue Injury - Purple or maroon localized area of discolored intact skin or blood-filled blister due to damage of underlying soft tissue from pressure and/or shear. 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 Subcutaneous fat may be visible but bone, tendon or muscle are NOT exposed. 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 (Active)  State of Healing Early/partial granulation 05/17/2012 11:00 AM  Site / Wound Assessment Pink;Yellow 05/17/2012 11:00 AM  % Wound base Red or Granulating 90% 05/17/2012 11:00 AM  % Wound base Yellow 10% 05/17/2012 11:00 AM  % Wound base Black 0% 05/17/2012 11:00 AM  Peri-wound Assessment Maceration 05/17/2012 11:00 AM  Wound Length (cm) 6  cm 05/05/2012 12:00 AM  Wound Width (cm) 2 cm 05/05/2012 12:00 AM  Wound Depth (cm) 0.25 cm 05/05/2012 12:00 AM  Margins Unattacted edges (unapproximated) 05/17/2012 11:00 AM  Drainage Amount Scant 05/17/2012 11:00 AM  Drainage Description Serosanguineous 05/17/2012 11:00 AM  Treatment Hydrotherapy (Pulse lavage);Debridement (Selective);Packing (Saline gauze);Tape changed 05/17/2012 11:00 AM  Dressing Type ABD;Gauze (Comment);Moist to moist;Barrier Film (skin prep);Tape dressing 05/17/2012 11:00 AM  Dressing Changed 05/17/2012 11:00 AM     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 (Active)  State of Healing Early/partial granulation 05/14/2012  9:00 AM  Site / Wound Assessment Bleeding;Friable;Granulation tissue;Yellow 05/14/2012  9:00 AM  % Wound base Red or Granulating 75% 05/14/2012  9:00 AM  % Wound base Yellow 20% 05/14/2012  9:00 AM  % Wound base Black 0% 05/14/2012  9:00 AM  % Wound base Other (Comment) 5% 05/14/2012  9:00 AM  Peri-wound Assessment Maceration 05/14/2012  9:00 AM  Wound Length (cm) 10 cm 05/11/2012  9:00 AM  Wound Width (cm) 21 cm 05/11/2012  9:00 AM  Wound Depth (cm) 6 cm 05/11/2012  9:00 AM  Tunneling (cm) 4 cm tunnelling at 1:00 and 5 cm tunnelling at 7:00; small 2 cm tunnel underneath tendon 05/11/2012  9:00 AM  Margins Unattacted edges (unapproximated) 05/14/2012  9:00 AM  Drainage Amount Moderate 05/14/2012  9:00 AM  Drainage Description Serosanguineous 05/14/2012  9:00 AM  Treatment Hydrotherapy (Pulse lavage);Packing (Saline gauze);Tape changed 05/14/2012  9:00 AM  Dressing Type ABD;Barrier Film (skin prep);Gauze (Comment);Moist to moist;Tape dressing 05/14/2012  9:00 AM  Dressing Clean;Dry;Intact;Changed 05/14/2012  9:00 AM     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 (Active)  State of Healing Fully granulated 05/17/2012 11:00 AM  Site / Wound Assessment  Red 05/17/2012 11:00 AM  % Wound base Red or Granulating 100% 05/17/2012 11:00 AM  % Wound base Yellow 0% 05/17/2012 11:00 AM  % Wound base Black 0% 05/17/2012 11:00 AM  Peri-wound Assessment Maceration 05/17/2012 11:00 AM  Wound Length (cm) 10 cm 05/05/2012 12:00 AM  Wound Width (cm) 5 cm 05/05/2012 12:00 AM  Wound Depth (cm) 0.5 cm 05/05/2012 12:00 AM  Margins Unattacted edges (unapproximated) 05/17/2012 11:00 AM  Drainage Amount Scant 05/17/2012 11:00 AM  Drainage Description Serosanguineous 05/17/2012 11:00 AM  Treatment Hydrotherapy (Pulse lavage);Debridement (Selective);Packing (Saline gauze);Tape changed 05/17/2012 11:00 AM  Dressing Type ABD;Barrier Film (skin prep);Gauze (Comment);Moist to moist;Liquid skin adhesive 05/17/2012 11:00 AM  Dressing Changed 05/17/2012 11:00 AM     Negative Pressure Wound Therapy Buttocks Right (Active)  Cycle Continuous 05/16/2012  7:30 PM  Target Pressure (mmHg) 125 05/16/2012  7:30 PM  Canister Changed No 05/16/2012  8:00 AM  Dressing Status Intact 05/16/2012  7:30 PM  Drainage Amount Moderate 05/16/2012  8:00 AM  Drainage Description Serosanguineous 05/16/2012  8:00 AM  Output (mL) 50 mL 05/17/2012  5:54 AM     Wound 01/17/12 Buttocks large decubitus to buttocks (Active)     Wound 01/17/12 Other (Comment) Knee Left (Active)     Wound 05/04/12 Skin tear Hip Right;Lower;Lateral (Active)  Site / Wound Assessment Pink 05/05/2012 12:10 PM  Margins Unattacted edges (unapproximated) 05/05/2012 12:10 PM  Closure None 05/05/2012 12:10 PM  Drainage Amount None 05/05/2012 12:10 PM  Dressing Type Transparent dressing 05/05/2012 12:10 PM  Dressing Changed New 05/05/2012 12:00 AM  Dressing Status Clean;Dry;Intact 05/05/2012 12:10 PM     Incision 05/14/12 Buttocks Right (Active)  Site / Wound Assessment Clean;Dry 05/14/2012  8:20 PM  Dressing Type Negative pressure wound therapy 05/15/2012  8:00 AM  Dressing Clean;Dry;Intact 05/15/2012  8:00 AM   Hydrotherapy Pulsed lavage therapy -  wound location: Sacrum and left buttock. (Right buttock with VAC currently in place to be changed 1/6.) Pulsed Lavage with Suction (psi): 4 psi Pulsed Lavage with Suction - Normal Saline Used: 1000 mL Pulsed Lavage Tip: Tip with splash shield Selective Debridement Selective Debridement - Location: Sacrum and left buttock. Selective Debridement - Tools Used: Scissors;Forceps Selective Debridement - Tissue Removed: Adherent yellow necrotic tissue and slough.   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 Pt to OR on 12/30 for VAC placement to right buttock with order to change in one week. Continued hydrotherapy to area of buttocks not included in Vac. Area very granulated this am with only about 10% yellow necrotic tissue noted in left buttock wounds. Will follow up 05/17/12 with hydrotherapy and possibly d/c pt from hydrotherapy if wound continues to be granulated and stable. As previously requested, please re-order for right buttock wound if needed after VAC change on 05/21/12.  Wound Therapy - Functional Problem List: decreased skin integrity secondary to immobility causing increased change of infection. Factors Delaying/Impairing Wound Healing: Altered sensation;Diabetes Mellitus;Infection - systemic/local;Immobility;Multiple medical problems;Vascular compromise Hydrotherapy Plan: Debridement;Dressing change;Patient/family education;Pulsatile lavage with suction Wound Therapy - Frequency: 6X / week Wound Therapy - Follow Up Recommendations: Skilled nursing facility (pt may refuse; has failed wound management at home previousl)  Wound Plan: Continue hydrotherapy as indicated. Possible d/c 05/17/12. No further treatment to right buttock due to [facility] currently in place.  Wound Therapy Goals- Improve the function of patient's integumentary system by progressing the  wound(s) through the phases of wound healing (inflammation - proliferation - remodeling) by: Decrease Necrotic Tissue to: 0 Decrease Necrotic Tissue - Progress: Progressing toward goal Increase Granulation Tissue to: 100 Increase Granulation Tissue - Progress: Progressing toward goal Improve Drainage Characteristics: Serous Improve Drainage Characteristics - Progress: Progressing toward goal Goals/treatment plan/discharge plan were made with and agreed upon by patient/family: Yes Time For Goal Achievement: 7 days Wound Therapy - Potential for Goals: Fair  Goals will be updated until maximal potential achieved or discharge criteria met.  Discharge criteria: when goals achieved, discharge from hospital, MD decision/surgical intervention, no progress towards goals, refusal/missing three consecutive treatments without notification or medical reason.  GP     Fabio Asa 05/17/2012, 12:02 PM Charlotte Crumb, PT DPT  [phone]

## 2012-05-17 NOTE — Progress Notes (Signed)
2 Days Post-Op  Subjective: Pt has ileus likely due to infectious process per IM. NG tube in place and functioning well.  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  VAC dressing was re applied by me due to leaking following hydrotherapy to the other open areas.  Will discontinue hydrotherapy to avoid disrupting the VAC dressings and start BID NS wet to dry dressings changes.  Pt reports he is otherwise feeling okay.   Objective: Vital signs in last 24 hours: Temp:  [98.2 F (36.8 C)-98.4 F (36.9 C)] 98.2 F (36.8 C) 06-11-22 4540) Pulse Rate:  [82-89] 89  06-11-22 0613) Resp:  [18-19] 18  06/11/2022 0613) BP: (140-162)/(70-84) 140/74 mmHg 06-11-2022 0613) SpO2:  [100 %] 100 % June 11, 2022 9811) Last BM Date: 05/16/12  Intake/Output from previous day: 01/01 0701 - 11-Jun-2022 0700 In: 1580 [I.V.:900; NG/GT:80; IV Piggyback:600] Out: 2625 [Urine:900; Emesis/NG output:1300; Drains:350; Stool:75] Intake/Output this shift: Total I/O In: 0  Out: 500 [Emesis/NG output:450; Drains:50]  General appearance: alert, cooperative and no distress Resp: clear to auscultation bilaterally Cardio: regular rate and rhythm GI: Soft, scant BS, scant liquid stool in colostomy bag Right ischial VAC dressing was reapplied using new dressing. Acell is intact and remains moist  Lab Results:   Basename Jun 11, 2012 0455 05/16/12 0550  WBC 11.0* 9.3  HGB 8.3* 8.3*  HCT 26.7* 26.9*  PLT 380 405*   BMET  Basename 2012-06-11 0455  NA 143  K 3.4*  CL 101  CO2 28  GLUCOSE 71  BUN 6  CREATININE 0.30*  CALCIUM 9.0   PT/INR No results found for this basename: LABPROT:2,INR:2 in the last 72 hours ABG No results found for this basename: PHART:2,PCO2:2,PO2:2,HCO3:2 in the last 72 hours  Studies/Results: Dg Abd 2 Views  06-11-12  *RADIOLOGY REPORT*  Clinical Data: Follow up of ileus with gastric distention.  ABDOMEN - 2 VIEW  Comparison: 05/13/2012  Findings: 2 right side up decubitus views.   These demonstrate no free intraperitoneal air or significant air fluid levels.  There is trace right-sided pleural fluid or thickening.  The supine view demonstrates placement of a nasogastric tube, which terminates in the antropyloric region.  Resolution of gastric distention.  Nonspecific small bowel dilatation within the right side of the abdomen up to 3.9 cm.  Colonic gas also identified. Distal stool.  Osseous fragmentation about the right acetabulum is suboptimally evaluated.  IMPRESSION: Placement of a nasogastric tube with resolution of gastric distention.  Nonspecific mild small bowel dilatation in the right side of the abdomen.   Original Report Authenticated By: Jeronimo Greaves, M.D.     Anti-infectives: Anti-infectives     Start     Dose/Rate Route Frequency Ordered Stop   06/11/2012 1100   cefTAZidime (FORTAZ) 2 g in dextrose 5 % 50 mL IVPB        2 g 100 mL/hr over 30 Minutes Intravenous 3 times per day 06-11-12 1013     05/14/12 1649   polymyxin B 500,000 Units, bacitracin 50,000 Units in sodium chloride irrigation 0.9 % 500 mL irrigation  Status:  Discontinued          As needed 05/14/12 1649 05/14/12 1716   05/14/12 0600   ceFAZolin (ANCEF) IVPB 2 g/50 mL premix  Status:  Discontinued        2 g 100 mL/hr over 30 Minutes Intravenous On call to O.R. 05/13/12 1331 05/14/12 1903   05/08/12 1400   meropenem (MERREM) 1 g  in sodium chloride 0.9 % 100 mL IVPB  Status:  Discontinued        1 g 200 mL/hr over 30 Minutes Intravenous 3 times per day 05/08/12 1332 05/17/12 1012   05/04/12 2200   vancomycin (VANCOCIN) 1,500 mg in sodium chloride 0.9 % 500 mL IVPB        1,500 mg 250 mL/hr over 120 Minutes Intravenous Every 12 hours 05/04/12 0909     05/04/12 1600   imipenem-cilastatin (PRIMAXIN) 500 mg in sodium chloride 0.9 % 100 mL IVPB  Status:  Discontinued        500 mg 200 mL/hr over 30 Minutes Intravenous Every 6 hours 05/04/12 0909 05/08/12 1328   05/04/12 0915   vancomycin  (VANCOCIN) 500 mg in sodium chloride 0.9 % 100 mL IVPB        500 mg 100 mL/hr over 60 Minutes Intravenous To Emergency Dept 05/04/12 0907 05/04/12 1126   05/04/12 0915   imipenem-cilastatin (PRIMAXIN) 500 mg in sodium chloride 0.9 % 100 mL IVPB        500 mg 200 mL/hr over 30 Minutes Intravenous To Emergency Dept 05/04/12 0907 05/04/12 1346   05/04/12 0615   vancomycin (VANCOCIN) IVPB 1000 mg/200 mL premix        1,000 mg 200 mL/hr over 60 Minutes Intravenous  Once 05/04/12 0611 05/04/12 0821   05/04/12 0615   piperacillin-tazobactam (ZOSYN) IVPB 3.375 g        3.375 g 12.5 mL/hr over 240 Minutes Intravenous  Once 05/04/12 8295 05/04/12 1059          Assessment/Plan: s/p Procedure(s) (LRB) with comments: ESOPHAGOGASTRODUODENOSCOPY (EGD) (N/A) - 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  Management of ileus per IM and GI Will plan to change VAC dressing again on next Monday.    LOS: 13 days    Kee Drudge,PA-C Plastic Surgery [phone]

## 2012-05-17 NOTE — Progress Notes (Signed)
[facility] Progress Note  Subjective: Patient has no new complaints, wanting NG tube out but still with high output drainage Objective: Vital signs in last 24 hours: Temp:  [98.2 F (36.8 C)-98.4 F (36.9 C)] 98.2 F (36.8 C) (01/02 1610) Pulse Rate:  [82-89] 89  (01/02 0613) Resp:  [18-19] 18  (01/02 0613) BP: (140-162)/(70-84) 140/74 mmHg (01/02 0613) SpO2:  [100 %] 100 % (01/02 9604) Weight change:    PE: Abdomen is distended semitaut Lab Results: Results for orders placed during the hospital encounter of 05/04/12 (from the past 24 hour(s))  VANCOMYCIN, TROUGH     Status: Normal   Collection Time   05/16/12  3:05 PM      Component Value Range   Vancomycin Tr 15.6  10.0 - 20.0 ug/mL  CBC     Status: Abnormal   Collection Time   05/17/12  4:55 AM      Component Value Range   WBC 11.0 (*) 4.0 - 10.5 K/uL   RBC 3.40 (*) 4.22 - 5.81 MIL/uL   Hemoglobin 8.3 (*) 13.0 - 17.0 g/dL   HCT 54.0 (*) 98.1 - 19.1 %   MCV 78.5  78.0 - 100.0 fL   MCH 24.4 (*) 26.0 - 34.0 pg   MCHC 31.1  30.0 - 36.0 g/dL   RDW 47.8 (*) 29.5 - 62.1 %   Platelets 380  150 - 400 K/uL  BASIC METABOLIC PANEL     Status: Abnormal   Collection Time   05/17/12  4:55 AM      Component Value Range   Sodium 143  135 - 145 mEq/L   Potassium 3.4 (*) 3.5 - 5.1 mEq/L   Chloride 101  96 - 112 mEq/L   CO2 28  19 - 32 mEq/L   Glucose, Bld 71  70 - 99 mg/dL   BUN 6  6 - 23 mg/dL   Creatinine, Ser 3.08 (*) 0.50 - 1.35 mg/dL   Calcium 9.0  8.4 - 65.7 mg/dL   GFR calc non Af Amer >90  >90 mL/min   GFR calc Af Amer >90  >90 mL/min    Studies/Results: No results found.    Assessment: Gastric ileus/gastroparesis precise etiology unclear, unclear whether improving but with fairly significant NG output  Plan: Will increase Reglan to 10 mg IV every 6 hours We'll obtain flat and upright abdominal films which has not been done in a few days.    Rondalyn Belford C 05/17/2012, 8:24 AM

## 2012-05-18 LAB — TISSUE CULTURE

## 2012-05-18 LAB — BASIC METABOLIC PANEL
BUN: 5 mg/dL — ABNORMAL LOW (ref 6–23)
Chloride: 100 mEq/L (ref 96–112)
GFR calc Af Amer: >90 mL/min (ref >90)
Potassium: 3.3 mEq/L — ABNORMAL LOW (ref 3.5–5.1)

## 2012-05-18 NOTE — Progress Notes (Signed)
[facility] Progress Note  Subjective: Patient without significant complaint. 600 cc of bilious drainage from NG tube which he states was changed about 12 hours ago. 150 cc charted for today's date. He's having some gas and small amount of stool from his ostomy. As the abdominal pain and nausea  Objective: Vital signs in last 24 hours: Temp:  [97.9 F (36.6 C)-98.9 F (37.2 C)] 98 F (36.7 C) (01/03 0646) Pulse Rate:  [74-90] 74  (01/03 0646) Resp:  [18-20] 20  (01/03 0646) BP: (130-160)/(78-90) 160/90 mmHg (01/03 0646) SpO2:  [95 %-100 %] 95 % (01/03 0646) Weight change:    PE: NG tube in place, abdomen soft but obese Lab Results: Results for orders placed during the hospital encounter of 05/04/12 (from the past 24 hour(s))  GLUCOSE, CAPILLARY     Status: Normal   Collection Time   05-29-12  9:27 PM      Component Value Range   Glucose-Capillary 85  70 - 99 mg/dL  BASIC METABOLIC PANEL     Status: Abnormal   Collection Time   05/18/12  6:12 AM      Component Value Range   Sodium 142  135 - 145 mEq/L   Potassium 3.3 (*) 3.5 - 5.1 mEq/L   Chloride 100  96 - 112 mEq/L   CO2 23  19 - 32 mEq/L   Glucose, Bld 68 (*) 70 - 99 mg/dL   BUN 5 (*) 6 - 23 mg/dL   Creatinine, Ser 5.78 (*) 0.50 - 1.35 mg/dL   Calcium 9.1  8.4 - 46.9 mg/dL   GFR calc non Af Amer >90  >90 mL/min   GFR calc Af Amer >90  >90 mL/min    Studies/Results: Dg Abd 2 Views  29-May-2012  *RADIOLOGY REPORT*  Clinical Data: Follow up of ileus with gastric distention.  ABDOMEN - 2 VIEW  Comparison: 05/13/2012  Findings: 2 right side up decubitus views.  These demonstrate no free intraperitoneal air or significant air fluid levels.  There is trace right-sided pleural fluid or thickening.  The supine view demonstrates placement of a nasogastric tube, which terminates in the antropyloric region.  Resolution of gastric distention.  Nonspecific small bowel dilatation within the right side of the abdomen up to 3.9 cm.   Colonic gas also identified. Distal stool.  Osseous fragmentation about the right acetabulum is suboptimally evaluated.  IMPRESSION: Placement of a nasogastric tube with resolution of gastric distention.  Nonspecific mild small bowel dilatation in the right side of the abdomen.   Original Report Authenticated By: Jeronimo Greaves, M.D.       Assessment: Ileus with strong gastric component/gastroparesis apparently related to sepsis possibly gradually improving, on Reglan 10 mg 4 times a day.  Plan: At Some point will need to either clamp NG tube and allow clear liquids to assess tolerance of by mouth this or begin nasoenteric feedings. Erythromycin could be added to Reglan but additive effect of these 2 medicines in stimulating gastric motility is not clear. We'll continue to follow.    Alexandra Lipps C 05/18/2012, 9:02 AM

## 2012-05-18 NOTE — Progress Notes (Signed)
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 Kelly Splinter. - improved with IV hydration. - biopsy & cultures of 12.30.2013 sacral decub ulcer: Pseudomonas Sensitive to Pip/tazo and ceftazidime and Staph aureus. - vanc and fortaz 2.2.2014. - blood culture on 12.21.2013 negative till date.  Nausea and vomiting 2/2 ileus: - will try ice chip and Clamp NG on 1.3.2013. - EGD 12.31.2013: mild erosive esophagitis with some semi-confluent exudate distally, no stricture or definite ulcer - NG tube continues to have large amount of drainage. May have ice chips. Once Ng drainage improve will clamp. - On IV reglan. Repeated abdominal x- ray shows improvement.  Quadriplegia (07/23/2011): - with colostomy, suprapubic catheter which is changed every 2 months, urologist Dr. Isabel Caprice. No acute issues continue supportive care.   Seizure disorder: - none in house, monitor.  History of recurrent UTIs:  - UTI versus colonization, noted cultures, antibiotics for #1 above will suffice  HTN (hypertension): - continue current regimen.    Code Status: Full  Family Communication: Discussed with the patient  Disposition Plan: To the OR for debridement next Monday, LTAC rehabilitation     Consultants: ID, orthopedics case discussed with Dr. Rennis Chris @ [phone] on 05/06/2012 he recommended to discuss it with Dr. Helyn Numbers, called plastic surgeon Dr. 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 Superimposed acute osteomyelitis is likely.  -  Extensive inflammatory changes surrounding the right hip with large irregular peripherally enhancing fluid  collections, synovial  enhancement and muscular enhancement adjacent to a large decubitus ulcer. These findings are consistent with chronic soft tissue infection with abscess formation.     Antibiotics:  Vanc 12.19.2013>>>  imipenem 12.19.2013. 1.1.2014  fortaz 1.2.2014  HPI/Subjective: No complains.  Objective: Filed Vitals:   05/17/12 0613 05/17/12 1424 05/17/12 2123 05/18/12 0646  BP: 140/74 130/78 154/87 160/90  Pulse: 89 90 85 74  Temp: 98.2 F (36.8 C) 97.9 F (36.6 C) 98.9 F (37.2 C) 98 F (36.7 C)  TempSrc: Oral Axillary Oral Axillary  Resp: 18 20 18 20   Height:      Weight:      SpO2: 100% 100% 97% 95%    Intake/Output Summary (Last 24 hours) at 05/18/12 0859 Last data filed at 05/18/12 0600  Gross per 24 hour  Intake 2229.17 ml  Output   2150 ml  Net  79.17 ml   Filed Weights   05/04/12 0322 05/04/12 1730  Weight: 113.399 kg (250 lb) 111.6 kg (246 lb 0.5 oz)    Exam:  General: Alert, awake, oriented x3, in no acute distress.  HEENT: No bruits, no goiter.  Heart: Regular rate and rhythm, without murmurs, rubs, gallops.  Lungs: Good air movement, clear to auscultation.  Abdomen: Soft, nontender, nondistended, positive bowel sounds.  Neuro: Grossly intact, nonfocal.   Data Reviewed: Basic Metabolic Panel:  Lab 05/18/12 6213 05/17/12 0455 05/14/12 1145 05/12/12 0510  NA 142 143 143 140  K 3.3* 3.4* 3.8 3.8  CL 100 101 103 102  CO2 23 28 33* 31  GLUCOSE 68* 71 83 97  BUN 5* 6 13 10   CREATININE 0.31* 0.30* 0.33* 0.40*  CALCIUM 9.1 9.0 8.9 8.8  MG -- -- -- --  PHOS -- -- -- --  Liver Function Tests:  Lab 05/14/12 1145  AST 14  ALT 11  ALKPHOS 64  BILITOT 0.2*  PROT 7.6  ALBUMIN 2.4*   No results found for this basename: LIPASE:5,AMYLASE:5 in the last 168 hours No results found for this basename: AMMONIA:5 in the last 168 hours CBC:  Lab 05/17/12 0455 05/16/12 0550 05/14/12 1145 05/12/12 0510  WBC 11.0* 9.3 10.0 14.6*    NEUTROABS -- -- -- --  HGB 8.3* 8.3* 9.0* 8.8*  HCT 26.7* 26.9* 28.3* 28.8*  MCV 78.5 77.7* 78.6 78.7  PLT 380 405* 432* 466*   Cardiac Enzymes: No results found for this basename: CKTOTAL:5,CKMB:5,CKMBINDEX:5,TROPONINI:5 in the last 168 hours BNP (last 3 results) No results found for this basename: PROBNP:3 in the last 8760 hours CBG:  Lab 05/17/12 2127  GLUCAP 85    Recent Results (from the past 240 hour(s))  TISSUE CULTURE     Status: Normal   Collection Time   05/14/12  4:23 PM      Component Value Range Status Comment   Specimen Description TISSUE BONE   Final    Special Requests PATIENT ON FOLLOWING VANCOMYCIN NO 1 RIGHT ISHIUM   Final    Gram Stain     Final    Value: MODERATE WBC PRESENT,BOTH PMN AND MONONUCLEAR     NO ORGANISMS SEEN   Culture FEW PSEUDOMONAS AERUGINOSA   Final    Report Status 05/17/2012 FINAL   Final    Organism ID, Bacteria PSEUDOMONAS AERUGINOSA   Final   ANAEROBIC CULTURE     Status: Normal (Preliminary result)   Collection Time   05/14/12  4:23 PM      Component Value Range Status Comment   Specimen Description TISSUE BONE   Final    Special Requests PATIENT ON FOLLOWING VANCOMYCIN NO 1 RIGHT ISHIUM   Final    Gram Stain PENDING   Incomplete    Culture     Final    Value: NO ANAEROBES ISOLATED; CULTURE IN PROGRESS FOR 5 DAYS   Report Status PENDING   Incomplete   TISSUE CULTURE     Status: Normal (Preliminary result)   Collection Time   05/14/12  4:36 PM      Component Value Range Status Comment   Specimen Description TISSUE BONE   Final    Special Requests PATIENT ON FOLLOWING VANCOMYCIN NO 3 FEMUR   Final    Gram Stain     Final    Value: NO WBC SEEN     NO ORGANISMS SEEN   Culture     Final    Value: FEW PSEUDOMONAS AERUGINOSA     Note: SUSCEPTIBILITIES PERFORMED ON PREVIOUS CULTURE WITHIN THE LAST 5 DAYS. REFER TO CULTURE [id number]   Report Status PENDING   Incomplete   ANAEROBIC CULTURE     Status: Normal (Preliminary result)    Collection Time   05/14/12  4:36 PM      Component Value Range Status Comment   Specimen Description TISSUE BONE   Final    Special Requests PATIENT ON FOLLOWING VANCOMYCIN NO 3 FEMUR   Final    Gram Stain PENDING   Incomplete    Culture     Final    Value: NO ANAEROBES ISOLATED; CULTURE IN PROGRESS FOR 5 DAYS   Report Status PENDING   Incomplete   TISSUE CULTURE     Status: Normal   Collection Time   05/14/12  5:00 PM  Component Value Range Status Comment   Specimen Description TISSUE BONE HIP RIGHT   Final    Special Requests PATIENT ON FOLLOWING VANCOMYCIN NO 2 POSTERIOR   Final    Gram Stain     Final    Value: NO WBC SEEN     NO ORGANISMS SEEN   Culture     Final    Value: FEW PSEUDOMONAS AERUGINOSA     Note: SUSCEPTIBILITIES PERFORMED ON PREVIOUS CULTURE WITHIN THE LAST 5 DAYS. REFER TO CULTURE [id number]   Report Status 05/17/2012 FINAL   Final   ANAEROBIC CULTURE     Status: Normal (Preliminary result)   Collection Time   05/14/12  5:00 PM      Component Value Range Status Comment   Specimen Description TISSUE BONE HIP RIGHT   Final    Special Requests PATIENT ON FOLLOWING VANCOMYCIN NO 2 POSTERIOR   Final    Gram Stain     Final    Value: NO WBC SEEN     NO ORGANISMS SEEN   Culture     Final    Value: NO ANAEROBES ISOLATED; CULTURE IN PROGRESS FOR 5 DAYS   Report Status PENDING   Incomplete      Studies: Dg Abd 2 Views  05/17/2012  *RADIOLOGY REPORT*  Clinical Data: Follow up of ileus with gastric distention.  ABDOMEN - 2 VIEW  Comparison: 05/13/2012  Findings: 2 right side up decubitus views.  These demonstrate no free intraperitoneal air or significant air fluid levels.  There is trace right-sided pleural fluid or thickening.  The supine view demonstrates placement of a nasogastric tube, which terminates in the antropyloric region.  Resolution of gastric distention.  Nonspecific small bowel dilatation within the right side of the abdomen up to 3.9 cm.  Colonic gas also  identified. Distal stool.  Osseous fragmentation about the right acetabulum is suboptimally evaluated.  IMPRESSION: Placement of a nasogastric tube with resolution of gastric distention.  Nonspecific mild small bowel dilatation in the right side of the abdomen.   Original Report Authenticated By: Jeronimo Greaves, M.D.     Scheduled Meds:   . baclofen  20 mg Oral QID  . cefTAZidime (FORTAZ)  IV  2 g Intravenous Q8H  . docusate sodium  100 mg Oral BID  . enoxaparin  55 mg Subcutaneous Q24H  . famotidine  20 mg Oral BID  . lactose free nutrition  237 mL Oral BID BM  . metoCLOPramide (REGLAN) injection  10 mg Intravenous Q6H  . nutrition supplement  1 packet Oral BID BM  . potassium chloride SA  20 mEq Oral Daily  . vancomycin  1,500 mg Intravenous Q12H   Continuous Infusions:   . 0.9 % NaCl with KCl 20 mEq / L 50 mL/hr at 05/17/12 1429     FELIZ Rosine Beat  [facility] Pager [phone].  If 8PM-8AM, please contact night-coverage at [email or web address], password [facility] 05/18/2012, 8:59 AM  LOS: 14 days

## 2012-05-18 NOTE — Progress Notes (Signed)
ANTIBIOTIC CONSULT NOTE - Follow-up  Pharmacy Consult for ceftazidime and vancomycin Indication: decubitus ulcer with chronic osteo  No Known Allergies  Patient Measurements: Height: 6' (182.9 cm) Weight: 246 lb 0.5 oz (111.6 kg) IBW/kg (Calculated) : 77.6   Vital Signs: Temp: 98 F (36.7 C) (01/03 1408) Temp src: Oral (01/03 1408) BP: 148/75 mmHg (01/03 1408) Pulse Rate: 70  (01/03 1408) Intake/Output from previous day: 01/02 0701 - 01/03 0700 In: 2229.2 [I.V.:1229.2; IV Piggyback:1000] Out: 2150 [Urine:1100; Emesis/NG output:900; Drains:150] Intake/Output from this shift: Total I/O In: 0  Out: 950 [Urine:450; Emesis/NG output:500]  Labs:  [facility] 05/18/12 0612 05/17/12 0455 05/16/12 0550  WBC -- 11.0* 9.3  HGB -- 8.3* 8.3*  PLT -- 380 405*  LABCREA -- -- --  CREATININE 0.31* 0.30* --   Estimated Creatinine Clearance: 150.4 ml/min (by C-G formula based on Cr of 0.31).  Basename 05/18/12 1315 05/16/12 1505  VANCOTROUGH 16.0 15.6  VANCOPEAK -- --  VANCORANDOM -- --  GENTTROUGH -- --  GENTPEAK -- --  GENTRANDOM -- --  TOBRATROUGH -- --  TOBRAPEAK -- --  TOBRARND -- --  AMIKACINPEAK -- --  AMIKACINTROU -- --  AMIKACIN -- --     Microbiology: Recent Results (from the past 720 hour(s))  URINE CULTURE     Status: Normal   Collection Time   04/18/12  8:27 PM      Component Value Range Status Comment   Specimen Description URINE, CATHETERIZED   Final    Special Requests NONE   Final    Culture  Setup Time 04/19/2012 02:25   Final    Colony Count >=100,000 COLONIES/ML   Final    Culture CITROBACTER FREUNDII   Final    Report Status 04/20/2012 FINAL   Final    Organism ID, Bacteria CITROBACTER FREUNDII   Final   URINE CULTURE     Status: Normal   Collection Time   04/22/12  7:36 AM      Component Value Range Status Comment   Specimen Description URINE, CATHETERIZED   Final    Special Requests NONE   Final    Culture  Setup Time 04/22/2012 15:22   Final    Colony Count >=100,000 COLONIES/ML   Final    Culture     Final    Value: CITROBACTER FREUNDII     PSEUDOMONAS AERUGINOSA   Report Status 04/25/2012 FINAL   Final    Organism ID, Bacteria CITROBACTER FREUNDII   Final    Organism ID, Bacteria PSEUDOMONAS AERUGINOSA   Final   CULTURE, BLOOD (ROUTINE X 2)     Status: Normal   Collection Time   04/22/12  8:55 AM      Component Value Range Status Comment   Specimen Description BLOOD RIGHT ARM   Final    Special Requests BOTTLES DRAWN AEROBIC ONLY 4CC   Final    Culture  Setup Time 04/22/2012 15:26   Final    Culture NO GROWTH 5 DAYS   Final    Report Status 04/28/2012 FINAL   Final   CULTURE, BLOOD (ROUTINE X 2)     Status: Normal   Collection Time   04/22/12  4:00 PM      Component Value Range Status Comment   Specimen Description BLOOD LEFT ARM   Final    Special Requests BOTTLES DRAWN AEROBIC AND ANAEROBIC 10CC   Final    Culture  Setup Time 04/22/2012 20:57   Final    Culture  NO GROWTH 5 DAYS   Final    Report Status 04/30/2012 FINAL   Final   URINE CULTURE     Status: Normal   Collection Time   05/04/12  4:41 AM      Component Value Range Status Comment   Specimen Description URINE, CATHETERIZED   Final    Special Requests CX ADDED AT 0505 ON [id number]   Final    Culture  Setup Time 05/04/2012 05:29   Final    Colony Count >=100,000 COLONIES/ML   Final    Culture     Final    Value: PSEUDOMONAS AERUGINOSA     YEAST   Report Status 05/06/2012 FINAL   Final    Organism ID, Bacteria PSEUDOMONAS AERUGINOSA   Final   WOUND CULTURE     Status: Normal   Collection Time   05/04/12  5:47 AM      Component Value Range Status Comment   Specimen Description WOUND BUTTOCK   Final    Special Requests Normal   Final    Gram Stain     Final    Value: MODERATE WBC PRESENT,BOTH PMN AND MONONUCLEAR     RARE SQUAMOUS EPITHELIAL CELLS PRESENT     FEW GRAM NEGATIVE RODS     RARE GRAM POSITIVE COCCI     IN PAIRS   Culture     Final    Value:  FEW METHICILLIN RESISTANT STAPHYLOCOCCUS AUREUS     Note: RIFAMPIN AND GENTAMICIN SHOULD NOT BE USED AS SINGLE DRUGS FOR TREATMENT OF STAPH INFECTIONS. This organism is presumed to be Clindamycin resistant based on detection of inducible Clindamycin resistance. CRITICAL RESULT CALLED TO, READ BACK BY AND      VERIFIED WITH: [name] 05/07/12 1310 [name]   Report Status 05/07/2012 FINAL   Final    Organism ID, Bacteria METHICILLIN RESISTANT STAPHYLOCOCCUS AUREUS   Final   CULTURE, BLOOD (ROUTINE X 2)     Status: Normal   Collection Time   05/04/12  9:50 AM      Component Value Range Status Comment   Specimen Description BLOOD HAND LEFT   Final    Special Requests BOTTLES DRAWN AEROBIC ONLY 10CC   Final    Culture  Setup Time 05/04/2012 20:22   Final    Culture NO GROWTH 5 DAYS   Final    Report Status 05/10/2012 FINAL   Final   CULTURE, BLOOD (ROUTINE X 2)     Status: Normal   Collection Time   05/04/12 10:00 AM      Component Value Range Status Comment   Specimen Description BLOOD HAND LEFT   Final    Special Requests     Final    Value: BOTTLES DRAWN AEROBIC AND ANAEROBIC BLUE 10CC  RED 6CC   Culture  Setup Time 05/04/2012 15:27   Final    Culture NO GROWTH 5 DAYS   Final    Report Status 05/10/2012 FINAL   Final   MRSA PCR SCREENING     Status: Normal   Collection Time   05/04/12  5:00 PM      Component Value Range Status Comment   MRSA by PCR NEGATIVE  NEGATIVE Final   TISSUE CULTURE     Status: Normal   Collection Time   05/14/12  4:23 PM      Component Value Range Status Comment   Specimen Description TISSUE BONE   Final    Special Requests PATIENT ON  FOLLOWING VANCOMYCIN NO 1 RIGHT ISHIUM   Final    Gram Stain     Final    Value: MODERATE WBC PRESENT,BOTH PMN AND MONONUCLEAR     NO ORGANISMS SEEN   Culture FEW PSEUDOMONAS AERUGINOSA   Final    Report Status 05/17/2012 FINAL   Final    Organism ID, Bacteria PSEUDOMONAS AERUGINOSA   Final   ANAEROBIC CULTURE      Status: Normal (Preliminary result)   Collection Time   05/14/12  4:23 PM      Component Value Range Status Comment   Specimen Description TISSUE BONE   Final    Special Requests PATIENT ON FOLLOWING VANCOMYCIN NO 1 RIGHT ISHIUM   Final    Gram Stain PENDING   Incomplete    Culture     Final    Value: NO ANAEROBES ISOLATED; CULTURE IN PROGRESS FOR 5 DAYS   Report Status PENDING   Incomplete   TISSUE CULTURE     Status: Normal   Collection Time   05/14/12  4:36 PM      Component Value Range Status Comment   Specimen Description TISSUE BONE   Final    Special Requests PATIENT ON FOLLOWING VANCOMYCIN NO 3 FEMUR   Final    Gram Stain     Final    Value: NO WBC SEEN     NO ORGANISMS SEEN   Culture     Final    Value: FEW PSEUDOMONAS AERUGINOSA     Note: SUSCEPTIBILITIES PERFORMED ON PREVIOUS CULTURE WITHIN THE LAST 5 DAYS. REFER TO CULTURE [id number]     FEW ENTEROCOCCUS SPECIES   Report Status 05/18/2012 FINAL   Final    Organism ID, Bacteria ENTEROCOCCUS SPECIES   Final   ANAEROBIC CULTURE     Status: Normal (Preliminary result)   Collection Time   05/14/12  4:36 PM      Component Value Range Status Comment   Specimen Description TISSUE BONE   Final    Special Requests PATIENT ON FOLLOWING VANCOMYCIN NO 3 FEMUR   Final    Gram Stain PENDING   Incomplete    Culture     Final    Value: NO ANAEROBES ISOLATED; CULTURE IN PROGRESS FOR 5 DAYS   Report Status PENDING   Incomplete   TISSUE CULTURE     Status: Normal   Collection Time   05/14/12  5:00 PM      Component Value Range Status Comment   Specimen Description TISSUE BONE HIP RIGHT   Final    Special Requests PATIENT ON FOLLOWING VANCOMYCIN NO 2 POSTERIOR   Final    Gram Stain     Final    Value: NO WBC SEEN     NO ORGANISMS SEEN   Culture     Final    Value: FEW PSEUDOMONAS AERUGINOSA     Note: SUSCEPTIBILITIES PERFORMED ON PREVIOUS CULTURE WITHIN THE LAST 5 DAYS. REFER TO CULTURE [id number]   Report Status 05/17/2012 FINAL    Final   ANAEROBIC CULTURE     Status: Normal (Preliminary result)   Collection Time   05/14/12  5:00 PM      Component Value Range Status Comment   Specimen Description TISSUE BONE HIP RIGHT   Final    Special Requests PATIENT ON FOLLOWING VANCOMYCIN NO 2 POSTERIOR   Final    Gram Stain     Final    Value: NO WBC SEEN  NO ORGANISMS SEEN   Culture     Final    Value: NO ANAEROBES ISOLATED; CULTURE IN PROGRESS FOR 5 DAYS   Report Status PENDING   Incomplete      Assessment: 47 y/o male patient on vanc + ceftazidime. Pt will require 6 to 8 weeks of IV abx. Most recent Vanc trough on 12/25 was 20.2 mcg/ml at steady state  (Goal 15-20). Pt is afebrile and WBC is 11. Vanc trough check today and equal to 16 which is therapeutic.  12/20 Wound >>MRSA (vanc MIC = 1) 12/20 Blood x 2>>Negative 12/20 Urine >>pseudomonas - pan sensitive 12/30 tissue culture (femur)>>pseudomonas sensitive to ceftazidime, pip/tazo, gent, tobra; enterococcus sensitive to amp and vancomycin   Goal of Therapy:  Vanc trough 15-20  Plan:  1. Continue Ceftazidime 2gm IV Q8H 2. Continue Vancomycin 1.5g IV q12h 3. F/u renal fxn, clinical status    Celedonio Miyamoto, PharmD, [facility] Clinical Pharmacist Pager [phone]  05/18/2012 2:20 PM

## 2012-05-18 NOTE — Progress Notes (Addendum)
NUTRITION FOLLOW UP  Intervention:   Strongly recommend nutrition support if unable to advance diet soon. Pt requires increased calories and protein to support healing of advanced stage wounds. If enteral nutrition desired, please consult RD for recommendations.  Nutrition Dx:   Increased nutrient needs related to wound healing as evidenced by estimated needs. Ongoing.  Goal:   Pt to meet >/= 90% of their estimated nutrition needs.   Monitor:   weight trends, lab trends, I/O's, PO intake, supplement tolerance  Assessment:   GI consulted for gastric distention, ileus vs gastric obstruction. EGD completed 12/31 showing mild erosive esophagitis   Pt was receiving hydrotherapy regularly however d/c'd yesterday by MD 2/2 recent disruption to the VAC dressings.  Continues with NGT with significant drainage, 900 cc yesterday. Was last on po diet on 12/30. Per gastroenterologist, pt needs to have NGT clamped with trial of clears or nasoenteric feedings.  Height: Ht Readings from Last 1 Encounters:  05/04/12 6' (1.829 m)    Weight Status:  No new wt available Wt Readings from Last 1 Encounters:  05/04/12 246 lb 0.5 oz (111.6 kg)    Estimated needs:  Kcal: 2100 - 2300 kcal Protein: 120 - 130 grams protein Fluid: 2.1 - 2.3 liters daily  Skin: stage III and IV wounds; wound VAC to buttocks  Diet Order:     Intake/Output Summary (Last 24 hours) at 05/18/12 1501 Last data filed at 05/18/12 1409  Gross per 24 hour  Intake    800 ml  Output   2100 ml  Net  -1300 ml    Last BM: 1/1  Labs:   Lab 05/18/12 0612 05/17/12 0455 05/14/12 1145  NA 142 143 143  K 3.3* 3.4* 3.8  CL 100 101 103  CO2 23 28 33*  BUN 5* 6 13  CREATININE 0.31* 0.30* 0.33*  CALCIUM 9.1 9.0 8.9  MG -- -- --  PHOS -- -- --  GLUCOSE 68* 71 83    CBG (last 3)   Basename 05/17/12 2127  GLUCAP 85    Scheduled Meds:    . baclofen  20 mg Oral QID  . cefTAZidime (FORTAZ)  IV  2 g Intravenous Q8H    . docusate sodium  100 mg Oral BID  . enoxaparin  55 mg Subcutaneous Q24H  . famotidine  20 mg Oral BID  . lactose free nutrition  237 mL Oral BID BM  . metoCLOPramide (REGLAN) injection  10 mg Intravenous Q6H  . nutrition supplement  1 packet Oral BID BM  . potassium chloride SA  20 mEq Oral Daily  . vancomycin  1,500 mg Intravenous Q12H    Continuous Infusions:    . 0.9 % NaCl with KCl 20 mEq / L 50 mL/hr at 05/17/12 1429   Jarold Motto MS, RD, LDN Pager: [phone] After-hours pager: [phone]

## 2012-05-19 DIAGNOSIS — A419 Sepsis, unspecified organism: Secondary | ICD-10-CM

## 2012-05-19 DIAGNOSIS — L8990 Pressure ulcer of unspecified site, unspecified stage: Secondary | ICD-10-CM

## 2012-05-19 DIAGNOSIS — R6520 Severe sepsis without septic shock: Secondary | ICD-10-CM

## 2012-05-19 MED ORDER — DOCUSATE SODIUM 50 MG/5ML PO LIQD
100.0000 mg | Freq: Two times a day (BID) | ORAL | Status: DC
  Administered 2012-05-19 – 2012-05-20 (×3): 100 mg via ORAL
  Filled 2012-05-19 (×4): qty 10

## 2012-05-19 MED ORDER — POTASSIUM CHLORIDE 20 MEQ/15ML (10%) PO LIQD
20.0000 meq | Freq: Every day | ORAL | Status: DC
  Administered 2012-05-19 – 2012-05-22 (×4): 20 meq via ORAL
  Filled 2012-05-19 (×4): qty 15

## 2012-05-19 NOTE — Progress Notes (Signed)
PT said it hurts to wear his cpap with the naso tube in place and said he would call if he changed his mind

## 2012-05-19 NOTE — Progress Notes (Signed)
[facility] PROGRESS NOTE Assessment/Plan: Sepsis 2/2 chronic stage IV sacral decubitus ulcer and adjacent osteomyelitis with now multiple abscesses: - S/p I and D on 05/15/2012 by Dr. Kelly Splinter. - Improved with IV hydration. - Biopsy & cultures of 05/14/2012 sacral decub ulcer: Pseudomonas Sensitive to Pip/tazo and ceftazidime and Staph aureus. - Vanc and fortaz 06/17/2012. - Blood culture on 05/05/2012 negative till date.  Nausea and vomiting 2/2 ileus: - Clamp NG on 05/19/2011, on clear liquid diet. - EGD 05/15/2012: mild erosive esophagitis with some semi-confluent exudate distally, no stricture or definite ulcer - NG tube continues to have large amount of drainage. May have ice chips. Once Ng drainage improve will clamp. - On IV reglan. Repeated abdominal x- ray shows improvement.  Quadriplegia: - With colostomy, suprapubic catheter which is changed every 2 months, urologist Dr. Isabel Caprice. No acute issues continue supportive care.   Seizure disorder: - None inpatient, monitor.  History of recurrent UTIs:  - UTI versus colonization, noted cultures, antibiotics for #1 above will suffice  HTN (hypertension): - Continue current regimen.  Code Status: Full  Family Communication: Discussed with the patient  Disposition Plan: To the OR for debridement next Monday, LTAC rehabilitation   Consultants: ID, orthopedics case discussed with Dr. Rennis Chris @ [phone] on 05/06/2012 he recommended to discuss it with Dr. Helyn Numbers, called plastic surgeon Dr. Helyn Numbers on 05/06/2012 @ [phone]    Procedures: 12.20.2013: Chronic dislocation of the right hip and fragmentation of the proximal right femur Sclerosis, edema and enhancement within the proximal femur and adjacent acetabulum are consistent with chronic osteomyelitis. Superimposed acute osteomyelitis is likely.  -  Extensive inflammatory changes surrounding the right hip with large irregular peripherally enhancing fluid collections,  synovial  enhancement and muscular enhancement adjacent to a large decubitus ulcer. These findings are consistent with chronic soft tissue infection with abscess formation.     Antibiotics:  Vanc 12.19.2013>>>  Imipenem 12.19.2013. >> 1.1.2014  Fortaz 1.2.2014  HPI/Subjective: No specific concerns.  Objective: Filed Vitals:   05/18/12 0646 05/18/12 1408 05/18/12 2210 05/19/12 0520  BP: 160/90 148/75 129/65 132/66  Pulse: 74 70 96 94  Temp: 98 F (36.7 C) 98 F (36.7 C) 97.9 F (36.6 C) 97.6 F (36.4 C)  TempSrc: Axillary Oral Oral Axillary  Resp: 20 18 18 18   Height:      Weight:      SpO2: 95% 96% 93% 94%    Intake/Output Summary (Last 24 hours) at 05/19/12 1140 Last data filed at 05/19/12 4782  Gross per 24 hour  Intake 3548.5 ml  Output   2980 ml  Net  568.5 ml   Filed Weights   05/04/12 0322 05/04/12 1730  Weight: 113.399 kg (250 lb) 111.6 kg (246 lb 0.5 oz)    Exam:  General: Alert, awake, oriented x3, in no acute distress.  HEENT: No bruits, no goiter.  Heart: Regular rate and rhythm, without murmurs, rubs, gallops.  Lungs: Good air movement, clear to auscultation.  Abdomen: Soft, nontender, nondistended, positive bowel sounds.  Neuro: Grossly intact, nonfocal.   Data Reviewed: Basic Metabolic Panel:  Lab 05/18/12 9562 05/17/12 0455 05/14/12 1145  NA 142 143 143  K 3.3* 3.4* 3.8  CL 100 101 103  CO2 23 28 33*  GLUCOSE 68* 71 83  BUN 5* 6 13  CREATININE 0.31* 0.30* 0.33*  CALCIUM 9.1 9.0 8.9  MG -- -- --  PHOS -- -- --   Liver Function Tests:  Lab 05/14/12 1145  AST 14  ALT 11  ALKPHOS 64  BILITOT 0.2*  PROT 7.6  ALBUMIN 2.4*   No results found for this basename: LIPASE:5,AMYLASE:5 in the last 168 hours No results found for this basename: AMMONIA:5 in the last 168 hours CBC:  Lab 05/17/12 0455 05/16/12 0550 05/14/12 1145  WBC 11.0* 9.3 10.0  NEUTROABS -- -- --  HGB 8.3* 8.3* 9.0*  HCT 26.7* 26.9* 28.3*  MCV 78.5 77.7* 78.6    PLT 380 405* 432*   Cardiac Enzymes: No results found for this basename: CKTOTAL:5,CKMB:5,CKMBINDEX:5,TROPONINI:5 in the last 168 hours BNP (last 3 results) No results found for this basename: PROBNP:3 in the last 8760 hours CBG:  Lab 05/17/12 2127  GLUCAP 85    Recent Results (from the past 240 hour(s))  TISSUE CULTURE     Status: Normal   Collection Time   05/14/12  4:23 PM      Component Value Range Status Comment   Specimen Description TISSUE BONE   Final    Special Requests PATIENT ON FOLLOWING VANCOMYCIN NO 1 RIGHT ISHIUM   Final    Gram Stain     Final    Value: MODERATE WBC PRESENT,BOTH PMN AND MONONUCLEAR     NO ORGANISMS SEEN   Culture FEW PSEUDOMONAS AERUGINOSA   Final    Report Status 05/17/2012 FINAL   Final    Organism ID, Bacteria PSEUDOMONAS AERUGINOSA   Final   ANAEROBIC CULTURE     Status: Normal (Preliminary result)   Collection Time   05/14/12  4:23 PM      Component Value Range Status Comment   Specimen Description TISSUE BONE   Final    Special Requests PATIENT ON FOLLOWING VANCOMYCIN NO 1 RIGHT ISHIUM   Final    Gram Stain PENDING   Incomplete    Culture     Final    Value: NO ANAEROBES ISOLATED; CULTURE IN PROGRESS FOR 5 DAYS   Report Status PENDING   Incomplete   TISSUE CULTURE     Status: Normal   Collection Time   05/14/12  4:36 PM      Component Value Range Status Comment   Specimen Description TISSUE BONE   Final    Special Requests PATIENT ON FOLLOWING VANCOMYCIN NO 3 FEMUR   Final    Gram Stain     Final    Value: NO WBC SEEN     NO ORGANISMS SEEN   Culture     Final    Value: FEW PSEUDOMONAS AERUGINOSA     Note: SUSCEPTIBILITIES PERFORMED ON PREVIOUS CULTURE WITHIN THE LAST 5 DAYS. REFER TO CULTURE [id number]     FEW ENTEROCOCCUS SPECIES   Report Status 05/18/2012 FINAL   Final    Organism ID, Bacteria ENTEROCOCCUS SPECIES   Final   ANAEROBIC CULTURE     Status: Normal (Preliminary result)   Collection Time   05/14/12  4:36 PM       Component Value Range Status Comment   Specimen Description TISSUE BONE   Final    Special Requests PATIENT ON FOLLOWING VANCOMYCIN NO 3 FEMUR   Final    Gram Stain PENDING   Incomplete    Culture     Final    Value: NO ANAEROBES ISOLATED; CULTURE IN PROGRESS FOR 5 DAYS   Report Status PENDING   Incomplete   TISSUE CULTURE     Status: Normal   Collection Time   05/14/12  5:00 PM      Component Value  Range Status Comment   Specimen Description TISSUE BONE HIP RIGHT   Final    Special Requests PATIENT ON FOLLOWING VANCOMYCIN NO 2 POSTERIOR   Final    Gram Stain     Final    Value: NO WBC SEEN     NO ORGANISMS SEEN   Culture     Final    Value: FEW PSEUDOMONAS AERUGINOSA     Note: SUSCEPTIBILITIES PERFORMED ON PREVIOUS CULTURE WITHIN THE LAST 5 DAYS. REFER TO CULTURE [id number]   Report Status 05/17/2012 FINAL   Final   ANAEROBIC CULTURE     Status: Normal (Preliminary result)   Collection Time   05/14/12  5:00 PM      Component Value Range Status Comment   Specimen Description TISSUE BONE HIP RIGHT   Final    Special Requests PATIENT ON FOLLOWING VANCOMYCIN NO 2 POSTERIOR   Final    Gram Stain     Final    Value: NO WBC SEEN     NO ORGANISMS SEEN   Culture     Final    Value: NO ANAEROBES ISOLATED; CULTURE IN PROGRESS FOR 5 DAYS   Report Status PENDING   Incomplete      Studies: No results found.  Scheduled Meds:    . baclofen  20 mg Oral QID  . cefTAZidime (FORTAZ)  IV  2 g Intravenous Q8H  . docusate  100 mg Oral BID  . enoxaparin  55 mg Subcutaneous Q24H  . famotidine  20 mg Oral BID  . lactose free nutrition  237 mL Oral BID BM  . metoCLOPramide (REGLAN) injection  10 mg Intravenous Q6H  . nutrition supplement  1 packet Oral BID BM  . potassium chloride  20 mEq Oral Daily  . vancomycin  1,500 mg Intravenous Q12H   Continuous Infusions:    . 0.9 % NaCl with KCl 20 mEq / L 50 mL/hr at 05/17/12 1429     Sheryll Dymek A  [facility] Pager [phone].  If  7PM-7AM, please contact night-coverage at [email or web address], password [email or web address] 05/19/2012, 11:40 AM  LOS: 15 days

## 2012-05-19 NOTE — Progress Notes (Signed)
Subjective: NGT clamped.  Tolerating sips chips and water. Passing flatus through ostomy.  Objective: Vital signs in last 24 hours: Temp:  [97.6 F (36.4 C)-98 F (36.7 C)] 97.6 F (36.4 C) (01/04 0520) Pulse Rate:  [70-96] 94  (01/04 0520) Resp:  [18] 18  (01/04 0520) BP: (129-148)/(65-75) 132/66 mmHg (01/04 0520) SpO2:  [93 %-96 %] 94 % (01/04 0520) Weight change:  Last BM Date: 05/16/12  PE: GEN:  Paraplegic, NAD ABD:  Soft, very mild distention and tympany; ostomy L-abdomen CDI  Lab Results: CBC    Component Value Date/Time   WBC 11.0* 05/17/2012 0455   RBC 3.40* 05/17/2012 0455   HGB 8.3* 05/17/2012 0455   HCT 26.7* 05/17/2012 0455   PLT 380 05/17/2012 0455   MCV 78.5 05/17/2012 0455   MCH 24.4* 05/17/2012 0455   MCHC 31.1 05/17/2012 0455   RDW 20.0* 05/17/2012 0455   LYMPHSABS 2.7 05/04/2012 0436   MONOABS 2.3* 05/04/2012 0436   EOSABS 0.4 05/04/2012 0436   BASOSABS 0.0 05/04/2012 0436   Assessment:  1.  Nausea, vomiting, improving. 2.  Abdominal distention and decreased ostomy output, improving. 3.  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  Is improving.  Plan:  1.  Trial of clear liquids. 2.  Keep NGT clamped; if can tolerate clear liquids, might be able to remove NGT tomorrow. 3.  Continue metoclopramide for now; anticipate 4-6 week total course, but would not stay on this long-term both because I doubt it will be needed in time as his sepsis resolves and due to long-term extrapyramidal Parkinson-like side effect profile. 4.  Will follow.   Freddy Jaksch 05/19/2012, 10:09 AM

## 2012-05-20 DIAGNOSIS — Z9359 Other cystostomy status: Secondary | ICD-10-CM

## 2012-05-20 LAB — CBC
Hemoglobin: 8.3 g/dL — ABNORMAL LOW (ref 13.0–17.0)
MCH: 24 pg — ABNORMAL LOW (ref 26.0–34.0)
MCV: 77.5 fL — ABNORMAL LOW (ref 78.0–100.0)
RBC: 3.46 MIL/uL — ABNORMAL LOW (ref 4.22–5.81)

## 2012-05-20 LAB — BASIC METABOLIC PANEL
CO2: 27 mEq/L (ref 19–32)
Calcium: 9.2 mg/dL (ref 8.4–10.5)
Chloride: 102 mEq/L (ref 96–112)
Creatinine, Ser: 0.36 mg/dL — ABNORMAL LOW (ref 0.50–1.35)
Glucose, Bld: 95 mg/dL (ref 70–99)

## 2012-05-20 MED ORDER — POTASSIUM CHLORIDE CRYS ER 20 MEQ PO TBCR
40.0000 meq | EXTENDED_RELEASE_TABLET | Freq: Four times a day (QID) | ORAL | Status: DC
  Filled 2012-05-20 (×3): qty 2

## 2012-05-20 MED ORDER — POTASSIUM CHLORIDE 20 MEQ/15ML (10%) PO LIQD
40.0000 meq | Freq: Four times a day (QID) | ORAL | Status: AC
  Administered 2012-05-20 (×3): 40 meq via ORAL
  Filled 2012-05-20 (×3): qty 30

## 2012-05-20 MED ORDER — DOCUSATE SODIUM 100 MG PO CAPS
100.0000 mg | ORAL_CAPSULE | Freq: Two times a day (BID) | ORAL | Status: DC
  Administered 2012-05-20 – 2012-05-22 (×4): 100 mg via ORAL
  Filled 2012-05-20 (×5): qty 1

## 2012-05-20 NOTE — Progress Notes (Signed)
Pt. requested not to be awaken during the night as he has not slept in more than 3 days

## 2012-05-20 NOTE — Progress Notes (Addendum)
[facility] PROGRESS NOTE Assessment/Plan: Sepsis 2/2 chronic stage IV sacral decubitus ulcer and adjacent osteomyelitis with now multiple abscesses: - S/p I and D on 05/14/2012 by Dr. Kelly Splinter. - Improved with IV hydration. - Biopsy & cultures of 05/14/2012 sacral decub ulcer: Pseudomonas Sensitive to Pip/tazo and ceftazidime and Staph aureus sensitive to ampicillin. - Vanc and fortaz since 05/17/2012. - Blood culture on 05/05/2012 negative till date.  Nausea and vomiting secondary to ileus: - NG removed today. Diet advanced to full liquid diet, diet advancement as per GI. - EGD 05/15/2012: mild erosive esophagitis with some semi-confluent exudate distally, no stricture or definite ulcer - NG tube continues to have large amount of drainage. May have ice chips. Once Ng drainage improve will clamp. - Reglan for 4-6 weeks.   Quadriplegia: - With colostomy, suprapubic catheter which is changed every 2 months, urologist Dr. Isabel Caprice. No acute issues continue supportive care.   Seizure disorder: - None inpatient, monitor.  History of recurrent UTIs:  - UTI versus colonization, noted cultures, antibiotics for #1 above will suffice.  Hypokalemia Replace as needed.  HTN (hypertension): - Continue current regimen.  Code Status: Full  Family Communication: Discussed with the patient  Disposition Plan: LTAC rehabilitation once patient's diet is advanced.  Consultants: ID Orthopedics Dr. Kelly Splinter  Procedures: 12.20.2013: Chronic dislocation of the right hip and fragmentation of the proximal right femur Sclerosis, edema and enhancement within the proximal femur and adjacent acetabulum are consistent with chronic osteomyelitis. Superimposed acute osteomyelitis is likely.  -  Extensive inflammatory changes surrounding the right hip with large irregular peripherally enhancing fluid collections, synovial  enhancement and muscular enhancement adjacent to a large decubitus ulcer. These  findings are consistent with chronic soft tissue infection with abscess formation.   Antibiotics:  Vanc 05/03/2012 >>  Imipenem 05/03/2012 >> 05/16/2012  Elita Quick 05/17/2012 >>  HPI/Subjective: No specific concerns.  Objective: Filed Vitals:   05/18/12 2210 05/19/12 0520 05/19/12 2309 05/20/12 0030  BP: 129/65 132/66 128/70   Pulse: 96 94 79 80  Temp: 97.9 F (36.6 C) 97.6 F (36.4 C) 99.3 F (37.4 C)   TempSrc: Oral Axillary Oral   Resp: 18 18 18 18   Height:      Weight:      SpO2: 93% 94% 98% 97%    Intake/Output Summary (Last 24 hours) at 05/20/12 1217 Last data filed at 05/19/12 2310  Gross per 24 hour  Intake      0 ml  Output   1100 ml  Net  -1100 ml   Filed Weights   05/04/12 0322 05/04/12 1730  Weight: 113.399 kg (250 lb) 111.6 kg (246 lb 0.5 oz)    Exam:  General: Alert, awake, oriented x3, in no acute distress.  HEENT: No bruits, no goiter.  Heart: Regular rate and rhythm, without murmurs, rubs, gallops.  Lungs: Good air movement, clear to auscultation.  Abdomen: Soft, nontender, nondistended, positive bowel sounds.  Neuro: Grossly intact, nonfocal.   Data Reviewed: Basic Metabolic Panel:  Lab 05/20/12 6962 05/18/12 0612 05/17/12 0455 05/14/12 1145  NA 139 142 143 143  K 3.0* 3.3* 3.4* 3.8  CL 102 100 101 103  CO2 27 23 28  33*  GLUCOSE 95 68* 71 83  BUN 7 5* 6 13  CREATININE 0.36* 0.31* 0.30* 0.33*  CALCIUM 9.2 9.1 9.0 8.9  MG -- -- -- --  PHOS -- -- -- --   Liver Function Tests:  Lab 05/14/12 1145  AST 14  ALT 11  ALKPHOS 64  BILITOT 0.2*  PROT 7.6  ALBUMIN 2.4*   No results found for this basename: LIPASE:5,AMYLASE:5 in the last 168 hours No results found for this basename: AMMONIA:5 in the last 168 hours CBC:  Lab 05/20/12 0500 05/17/12 0455 05/16/12 0550 05/14/12 1145  WBC 8.6 11.0* 9.3 10.0  NEUTROABS -- -- -- --  HGB 8.3* 8.3* 8.3* 9.0*  HCT 26.8* 26.7* 26.9* 28.3*  MCV 77.5* 78.5 77.7* 78.6  PLT 379 380 405* 432*    Cardiac Enzymes: No results found for this basename: CKTOTAL:5,CKMB:5,CKMBINDEX:5,TROPONINI:5 in the last 168 hours BNP (last 3 results) No results found for this basename: PROBNP:3 in the last 8760 hours CBG:  Lab 05/17/12 2127  GLUCAP 85    Recent Results (from the past 240 hour(s))  TISSUE CULTURE     Status: Normal   Collection Time   05/14/12  4:23 PM      Component Value Range Status Comment   Specimen Description TISSUE BONE   Final    Special Requests PATIENT ON FOLLOWING VANCOMYCIN NO 1 RIGHT ISHIUM   Final    Gram Stain     Final    Value: MODERATE WBC PRESENT,BOTH PMN AND MONONUCLEAR     NO ORGANISMS SEEN   Culture FEW PSEUDOMONAS AERUGINOSA   Final    Report Status 05/17/2012 FINAL   Final    Organism ID, Bacteria PSEUDOMONAS AERUGINOSA   Final   ANAEROBIC CULTURE     Status: Normal (Preliminary result)   Collection Time   05/14/12  4:23 PM      Component Value Range Status Comment   Specimen Description TISSUE BONE   Final    Special Requests PATIENT ON FOLLOWING VANCOMYCIN NO 1 RIGHT ISHIUM   Final    Gram Stain PENDING   Incomplete    Culture     Final    Value: NO ANAEROBES ISOLATED; CULTURE IN PROGRESS FOR 5 DAYS   Report Status PENDING   Incomplete   TISSUE CULTURE     Status: Normal   Collection Time   05/14/12  4:36 PM      Component Value Range Status Comment   Specimen Description TISSUE BONE   Final    Special Requests PATIENT ON FOLLOWING VANCOMYCIN NO 3 FEMUR   Final    Gram Stain     Final    Value: NO WBC SEEN     NO ORGANISMS SEEN   Culture     Final    Value: FEW PSEUDOMONAS AERUGINOSA     Note: SUSCEPTIBILITIES PERFORMED ON PREVIOUS CULTURE WITHIN THE LAST 5 DAYS. REFER TO CULTURE [id number]     FEW ENTEROCOCCUS SPECIES   Report Status 05/18/2012 FINAL   Final    Organism ID, Bacteria ENTEROCOCCUS SPECIES   Final   ANAEROBIC CULTURE     Status: Normal (Preliminary result)   Collection Time   05/14/12  4:36 PM      Component Value Range  Status Comment   Specimen Description TISSUE BONE   Final    Special Requests PATIENT ON FOLLOWING VANCOMYCIN NO 3 FEMUR   Final    Gram Stain PENDING   Incomplete    Culture     Final    Value: NO ANAEROBES ISOLATED; CULTURE IN PROGRESS FOR 5 DAYS   Report Status PENDING   Incomplete   TISSUE CULTURE     Status: Normal   Collection Time   05/14/12  5:00 PM  Component Value Range Status Comment   Specimen Description TISSUE BONE HIP RIGHT   Final    Special Requests PATIENT ON FOLLOWING VANCOMYCIN NO 2 POSTERIOR   Final    Gram Stain     Final    Value: NO WBC SEEN     NO ORGANISMS SEEN   Culture     Final    Value: FEW PSEUDOMONAS AERUGINOSA     Note: SUSCEPTIBILITIES PERFORMED ON PREVIOUS CULTURE WITHIN THE LAST 5 DAYS. REFER TO CULTURE [id number]   Report Status 05/17/2012 FINAL   Final   ANAEROBIC CULTURE     Status: Normal (Preliminary result)   Collection Time   05/14/12  5:00 PM      Component Value Range Status Comment   Specimen Description TISSUE BONE HIP RIGHT   Final    Special Requests PATIENT ON FOLLOWING VANCOMYCIN NO 2 POSTERIOR   Final    Gram Stain     Final    Value: NO WBC SEEN     NO ORGANISMS SEEN   Culture     Final    Value: NO ANAEROBES ISOLATED; CULTURE IN PROGRESS FOR 5 DAYS   Report Status PENDING   Incomplete      Studies: No results found.  Scheduled Meds:    . baclofen  20 mg Oral QID  . cefTAZidime (FORTAZ)  IV  2 g Intravenous Q8H  . docusate  100 mg Oral BID  . enoxaparin  55 mg Subcutaneous Q24H  . famotidine  20 mg Oral BID  . lactose free nutrition  237 mL Oral BID BM  . metoCLOPramide (REGLAN) injection  10 mg Intravenous Q6H  . nutrition supplement  1 packet Oral BID BM  . potassium chloride  20 mEq Oral Daily  . potassium chloride  40 mEq Oral Q6H  . vancomycin  1,500 mg Intravenous Q12H   Continuous Infusions:    . 0.9 % NaCl with KCl 20 mEq / L 50 mL/hr at 05/17/12 1429     [name] [name] A  [facility] Pager  [phone].  If 7PM-7AM, please contact night-coverage at [email or web address], password [facility] 05/20/2012, 12:17 PM  LOS: 16 days

## 2012-05-20 NOTE — Progress Notes (Signed)
Subjective: Tolerating clear liquids without nausea/vomiting. Is passing more flatus and now some stool per ostomy.  Objective: Vital signs in last 24 hours: Temp:  [99.3 F (37.4 C)] 99.3 F (37.4 C) (01/04 2309) Pulse Rate:  [79-80] 80  (01/05 0030) Resp:  [18] 18  (01/05 0030) BP: (128)/(70) 128/70 mmHg (01/04 2309) SpO2:  [97 %-98 %] 97 % (01/05 0030) Weight change:  Last BM Date: 05/20/12  PE: GEN:  NAD, overweight ABD:  Distention, mild-to-moderate, bit better cf. Yesterday; soft, active bowel sounds; L-sided colostomy.  Lab Results: CBC    Component Value Date/Time   WBC 8.6 05/20/2012 0500   RBC 3.46* 05/20/2012 0500   HGB 8.3* 05/20/2012 0500   HCT 26.8* 05/20/2012 0500   PLT 379 05/20/2012 0500   MCV 77.5* 05/20/2012 0500   MCH 24.0* 05/20/2012 0500   MCHC 31.0 05/20/2012 0500   RDW 19.1* 05/20/2012 0500   LYMPHSABS 2.7 05/04/2012 0436   MONOABS 2.3* 05/04/2012 0436   EOSABS 0.4 05/04/2012 0436   BASOSABS 0.0 05/04/2012 0436   CMP     Component Value Date/Time   NA 139 05/20/2012 0500   K 3.0* 05/20/2012 0500   CL 102 05/20/2012 0500   CO2 27 05/20/2012 0500   GLUCOSE 95 05/20/2012 0500   BUN 7 05/20/2012 0500   CREATININE 0.36* 05/20/2012 0500   CALCIUM 9.2 05/20/2012 0500   PROT 7.6 05/14/2012 1145   ALBUMIN 2.4* 05/14/2012 1145   AST 14 05/14/2012 1145   ALT 11 05/14/2012 1145   ALKPHOS 64 05/14/2012 1145   BILITOT 0.2* 05/14/2012 1145   GFRNONAA >90 05/20/2012 0500   GFRAA >90 05/20/2012 0500   Assessment:  1.  Nausea and vomiting.  Much improved.  Tolerating clear liquid diet. 2.  Abdominal distention with ileus.  Improving.  Passing flatus and, now, some stool per ostomy. 3.  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  Plan:  1.  I have removed patient's NGT today. 2.  Will advance diet. 3.  4-6 week short-term course of metoclopramide. 4.  Will revisit tomorrow.   [name] [name] 05/20/2012, 10:20  AM

## 2012-05-21 LAB — BASIC METABOLIC PANEL
BUN: 9 mg/dL (ref 6–23)
CO2: 26 mEq/L (ref 19–32)
Calcium: 9.6 mg/dL (ref 8.4–10.5)
GFR calc non Af Amer: >90 mL/min (ref >90)
Glucose, Bld: 99 mg/dL (ref 70–99)
Potassium: 3.7 mEq/L (ref 3.5–5.1)

## 2012-05-21 LAB — ANAEROBIC CULTURE: Gram Stain: NONE SEEN

## 2012-05-21 NOTE — Progress Notes (Signed)
[facility] PROGRESS NOTE  [name] [name] YNW:[id number] DOB: November 18, 1965 DOA: 05/04/2012 PCP: [name], MD  Assessment/Plan: Principal Problem:  *Sepsis Active Problems:  OSA (obstructive sleep apnea)  Obesity  Quadriplegia  Seizure disorder  HTN (hypertension)  Sacral decubitus ulcer, stage IV  Recurrent UTI    Sepsis 2/2 chronic stage IV sacral decubitus ulcer and adjacent osteomyelitis with now multiple abscesses:  - S/p I and D on 05/14/2012 by Dr. Kelly Splinter. - Improved with IV hydration.  - Biopsy & cultures of 05/14/2012 sacral decub ulcer: Pseudomonas Sensitive to Pip/tazo and ceftazidime and Staph aureus sensitive to ampicillin.  - Vanc and fortaz since 05/17/2012.  - Blood culture on 05/05/2012 negative till date.  Nausea and vomiting secondary to ileus:  - NG removed today. Diet advanced to full liquid diet, diet advancement as per GI.  - EGD 05/15/2012: mild erosive esophagitis with some semi-confluent exudate distally, no stricture or definite ulcer  - NG tube continues to have large amount of drainage. May have ice chips. Once Ng drainage improve will clamp.  - Reglan for 4-6 weeks.  Okay to advance diet    Quadriplegia: - With colostomy, suprapubic catheter which is changed every 2 months, urologist Dr. Isabel Caprice. No acute issues continue supportive care.   Seizure disorder: - None inpatient, monitor. History of recurrent UTIs:  - UTI versus colonization, noted cultures, antibiotics for #1 above will suffice.  Hypokalemia  Replace as needed.   HTN (hypertension): - Continue current regimen.  Code Status: Full  Family Communication: Discussed with the patient  Disposition Plan: LTAC rehabilitation once patient's diet is advanced.  Consultants:  ID  Orthopedics  Dr. Kelly Splinter  Procedures:  12.20.2013: Chronic dislocation of the right hip and fragmentation of the proximal right femur Sclerosis, edema and enhancement within the proximal femur and adjacent  acetabulum are consistent with chronic osteomyelitis. Superimposed acute osteomyelitis is likely.  - Extensive inflammatory changes surrounding the right hip with large irregular peripherally enhancing fluid collections, synovial  enhancement and muscular enhancement adjacent to a large decubitus ulcer. These findings are consistent with chronic soft tissue infection with abscess formation.  Antibiotics:  Vanc 05/03/2012 >>  Imipenem 05/03/2012 >> 05/16/2012  Elita Quick 05/17/2012 >>  HPI/Subjective:  Stable overnight  Objective: Filed Vitals:   05/20/12 1453 05/20/12 2252 05/21/12 0013 05/21/12 0625  BP: 121/68 121/71  119/68  Pulse: 96 84 88 87  Temp: 98.4 F (36.9 C) 98.8 F (37.1 C)  98.3 F (36.8 C)  TempSrc: Oral Oral    Resp: 18 18 18 18   Height:      Weight:      SpO2: 98% 96% 96% 95%    Intake/Output Summary (Last 24 hours) at 05/21/12 1408 Last data filed at 05/21/12 0854  Gross per 24 hour  Intake   1400 ml  Output   5275 ml  Net  -3875 ml    Exam:  HENT:  Head: Atraumatic.  Nose: Nose normal.  Mouth/Throat: Oropharynx is clear and moist.  Eyes: Conjunctivae are normal. Pupils are equal, round, and reactive to light. No scleral icterus.  Neck: Neck supple. No tracheal deviation present.  Cardiovascular: Normal rate, regular rhythm, normal heart sounds and intact distal pulses.  Pulmonary/Chest: Effort normal and breath sounds normal. No respiratory distress.  Abdominal: Soft. Normal appearance and bowel sounds are normal. She exhibits no distension. There is no tenderness.  Musculoskeletal: She exhibits no edema and no tenderness.  Neurological: She is alert. No cranial nerve deficit.  Data Reviewed: Basic Metabolic Panel:  Lab 05/21/12 1610 05/20/12 0500 05/18/12 0612 05/17/12 0455  NA 140 139 142 143  K 3.7 3.0* 3.3* 3.4*  CL 104 102 100 101  CO2 26 27 23 28   GLUCOSE 99 95 68* 71  BUN 9 7 5* 6  CREATININE 0.38* 0.36* 0.31* 0.30*  CALCIUM 9.6 9.2  9.1 9.0  MG 1.5 -- -- --  PHOS -- -- -- --    Liver Function Tests: No results found for this basename: AST:5,ALT:5,ALKPHOS:5,BILITOT:5,PROT:5,ALBUMIN:5 in the last 168 hours No results found for this basename: LIPASE:5,AMYLASE:5 in the last 168 hours No results found for this basename: AMMONIA:5 in the last 168 hours  CBC:  Lab 05/20/12 0500 05/17/12 0455 05/16/12 0550  WBC 8.6 11.0* 9.3  NEUTROABS -- -- --  HGB 8.3* 8.3* 8.3*  HCT 26.8* 26.7* 26.9*  MCV 77.5* 78.5 77.7*  PLT 379 380 405*    Cardiac Enzymes: No results found for this basename: CKTOTAL:5,CKMB:5,CKMBINDEX:5,TROPONINI:5 in the last 168 hours BNP (last 3 results) No results found for this basename: PROBNP:3 in the last 8760 hours   CBG:  Lab 05/17/12 2127  GLUCAP 85    Recent Results (from the past 240 hour(s))  TISSUE CULTURE     Status: Normal   Collection Time   05/14/12  4:23 PM      Component Value Range Status Comment   Specimen Description TISSUE BONE   Final    Special Requests PATIENT ON FOLLOWING VANCOMYCIN NO 1 RIGHT ISHIUM   Final    Gram Stain     Final    Value: MODERATE WBC PRESENT,BOTH PMN AND MONONUCLEAR     NO ORGANISMS SEEN   Culture FEW PSEUDOMONAS AERUGINOSA   Final    Report Status 05/17/2012 FINAL   Final    Organism ID, Bacteria PSEUDOMONAS AERUGINOSA   Final   ANAEROBIC CULTURE     Status: Normal   Collection Time   05/14/12  4:23 PM      Component Value Range Status Comment   Specimen Description TISSUE BONE   Final    Special Requests PATIENT ON FOLLOWING VANCOMYCIN NO 1 RIGHT ISHIUM   Final    Gram Stain     Final    Value: MODERATE WBC PRESENT,BOTH PMN AND MONONUCLEAR     NO ORGANISMS SEEN   Culture     Final    Value: BACTEROIDES THETAIOTAOMICRON     Note: BETA LACTAMASE POSITIVE   Report Status 05/21/2012 FINAL   Final   TISSUE CULTURE     Status: Normal   Collection Time   05/14/12  4:36 PM      Component Value Range Status Comment   Specimen Description  TISSUE BONE   Final    Special Requests PATIENT ON FOLLOWING VANCOMYCIN NO 3 FEMUR   Final    Gram Stain     Final    Value: NO WBC SEEN     NO ORGANISMS SEEN   Culture     Final    Value: FEW PSEUDOMONAS AERUGINOSA     Note: SUSCEPTIBILITIES PERFORMED ON PREVIOUS CULTURE WITHIN THE LAST 5 DAYS. REFER TO CULTURE [id number]     FEW ENTEROCOCCUS SPECIES   Report Status 05/18/2012 FINAL   Final    Organism ID, Bacteria ENTEROCOCCUS SPECIES   Final   ANAEROBIC CULTURE     Status: Normal   Collection Time   05/14/12  4:36 PM  Component Value Range Status Comment   Specimen Description TISSUE BONE   Final    Special Requests PATIENT ON FOLLOWING VANCOMYCIN NO 3 FEMUR   Final    Gram Stain     Final    Value: NO WBC SEEN     NO ORGANISMS SEEN   Culture     Final    Value: BACTEROIDES THETAIOTAOMICRON     Note: BETA LACTAMASE POSITIVE   Report Status 05/21/2012 FINAL   Final   TISSUE CULTURE     Status: Normal   Collection Time   05/14/12  5:00 PM      Component Value Range Status Comment   Specimen Description TISSUE BONE HIP RIGHT   Final    Special Requests PATIENT ON FOLLOWING VANCOMYCIN NO 2 POSTERIOR   Final    Gram Stain     Final    Value: NO WBC SEEN     NO ORGANISMS SEEN   Culture     Final    Value: FEW PSEUDOMONAS AERUGINOSA     Note: SUSCEPTIBILITIES PERFORMED ON PREVIOUS CULTURE WITHIN THE LAST 5 DAYS. REFER TO CULTURE [id number]   Report Status 05/17/2012 FINAL   Final   ANAEROBIC CULTURE     Status: Normal   Collection Time   05/14/12  5:00 PM      Component Value Range Status Comment   Specimen Description TISSUE BONE HIP RIGHT   Final    Special Requests PATIENT ON FOLLOWING VANCOMYCIN NO 2 POSTERIOR   Final    Gram Stain     Final    Value: NO WBC SEEN     NO ORGANISMS SEEN   Culture     Final    Value: BACTEROIDES THETAIOTAOMICRON     Note: BETA LACTAMASE POSITIVE   Report Status 05/21/2012 FINAL   Final      Studies: Dg Chest 2 View  04/22/2012   *RADIOLOGY REPORT*  Clinical Data: Chest pain and weakness.  CHEST - 2 VIEW  Comparison: 03/17/2012 and chest CT 10/26/2010  Findings: Two views of the chest were obtained.  Again noted are old left rib fractures.  In addition, there are chronic densities in the right upper chest related to the right scapula and posterior ribs based on previous CT.  Limited evaluation of the lung bases due to patient positioning and body habitus.  Heart and mediastinum appear grossly stable.  Postsurgical changes in the lower cervical spine.  IMPRESSION: Limited examination without acute findings.  There are chronic densities in the right upper chest as described. Evidence of old left rib fractures.   Original Report Authenticated By: Richarda Overlie, M.D.    Dg Abd 1 View  05/11/2012  *RADIOLOGY REPORT*  Clinical Data: Ileus  ABDOMEN - 1 VIEW  Comparison: 05/10/2012  Findings: Tube tip remains in the stomach.  The stomach is distended.  Gas filled loops of small and large bowel are present without disproportionate dilatation.  Pelvic deformity is again noted.  IMPRESSION: Gastric distention has developed despite NG tube position in the stomach. Low wall suction may be helpful.  No evidence of small bowel obstruction or small bowel ileus.   Original Report Authenticated By: Jolaine Click, M.D.    Dg Abd 1 View  05/10/2012  *RADIOLOGY REPORT*  Clinical Data: Nausea  ABDOMEN - 1 VIEW  Comparison: Yesterday  Findings: NG tube placed.  Gastric distention has resolved.  No disproportionate dilatation of bowel. 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  IMPRESSION: Gastric distention resolved after NG tube placement.   Original Report Authenticated By: Jolaine Click, M.D.    Dg Abd 1 View  05/09/2012  *RADIOLOGY REPORT*  Clinical Data: Vomiting  ABDOMEN - 1 VIEW  Comparison: CT 02/29/2012  Findings: There is massive gaseous distention of the stomach to  the approximate level of the duodenal bulb.  Remote fracture deformity with probable neuropathic change proximal right femur and pelvis, and changed.  No small or large bowel dilatation.  No free air accounting for supine technique.  IMPRESSION: Massive gastric gaseous distention which could reflect gastric outlet obstruction, gastroparesis, or ingested air less likely.   Original Report Authenticated By: Christiana Pellant, M.D.    Mr Pelvis W Wo Contrast  05/04/2012  *RADIOLOGY REPORT*  Clinical Data: Sacral decubitus ulcer with foul-smelling discharge. History of osteomyelitis treated with antibiotics.  MRI PELVIS WITHOUT AND WITH CONTRAST  Technique:  Multiplanar multisequence MR imaging of the pelvis was performed both before and after administration of intravenous contrast.  Contrast: 20mL MULTIHANCE GADOBENATE DIMEGLUMINE 529 MG/ML IV SOLN  Comparison: Pelvic CTs 02/29/2012 and 01/18/2012.  Findings: There is chronic dislocation of the right hip and chronic extensive fragmentation of the right femoral head and neck.  There is extensive heterotopic ossification surrounding the dislocated femoral head.  There is a deep decubitus ulcer posterior to the dislocated proximal femur which extends to the bone as on the prior CT. There are multiple peripherally enhancing fluid collections associated with the dislocated femoral head and lateral to the proximal femur.  The latter are incompletely imaged.  There is diffuse surrounding synovial and muscular enhancement. Edema and enhancement track along the right iliacus muscle.  There is irregular sclerosis, edema and enhancement within the proximal right femur.  Similar involvement is present within the right iliac bone and acetabulum.  There is chronic deformity of the right ischium.  The sacroiliac joints are chronically fused without adjacent marrow edema.  There is chronic deformity of the proximal left femur with surrounding heterotopic ossification.  The left femur  demonstrates no suspicious marrow signal or enhancement.  The patient has a descending colostomy and indwelling bladder catheter.  Multiple prominent pelvic and inguinal lymph nodes are present, likely reactive and similar to prior CT.  No intrapelvic fluid collections are identified.  IMPRESSION:  1.  Chronic dislocation of the right hip and fragmentation of the proximal right femur are similar to prior CT.  Comparison between modalities is limited. 2.  Sclerosis, edema and enhancement within the proximal femur and adjacent acetabulum are consistent with chronic osteomyelitis. Superimposed acute osteomyelitis is likely. 3.  Extensive inflammatory changes surrounding the right hip with large irregular peripherally enhancing fluid collections, synovial enhancement and muscular enhancement adjacent to a large decubitus ulcer.  These findings are consistent with chronic soft tissue infection with abscess formation.   Original Report Authenticated By: Carey Bullocks, M.D.    Ir Fluoro Guide Cv Line Right  04/22/2012  *RADIOLOGY REPORT*  Clinical Data: 47 year old with sepsis.  PICC LINE PLACEMENT WITH ULTRASOUND AND FLUOROSCOPIC  GUIDANCE  Fluoroscopy Time: 1.1 minutes.  The right arm was prepped with chlorhexidine, draped in the usual sterile fashion using maximum barrier technique (cap and mask, sterile gown, sterile gloves, large sterile sheet, hand hygiene and cutaneous antisepsis) and infiltrated locally with 1% Lidocaine.  Ultrasound demonstrated patency of the right brachial vein, and this was documented with an image.  Under real-time ultrasound guidance, this vein was accessed with a 21 gauge micropuncture needle and image documentation was performed.  The needle was exchanged over a guidewire for a peel-away sheath through which a five [place] dual lumen PICC trimmed to 52 cm was advanced, positioned with its tip at the lower SVC/right atrial junction. Fluoroscopy during the procedure and fluoro spot  radiograph confirms appropriate catheter position.  The catheter was flushed, secured to the skin with Prolene sutures, and covered with a sterile dressing.  Complications:  None  IMPRESSION: Successful right arm PICC line placement with ultrasound and fluoroscopic guidance.  The catheter is ready for use.   Original Report Authenticated By: Richarda Overlie, M.D.    Ir US Guide Vasc Access Right  04/22/2012  *RADIOLOGY REPORT*  Clinical Data: 47 year old with sepsis.  PICC LINE PLACEMENT WITH ULTRASOUND AND FLUOROSCOPIC  GUIDANCE  Fluoroscopy Time: 1.1 minutes.  The right arm was prepped with chlorhexidine, draped in the usual sterile fashion using maximum barrier technique (cap and mask, sterile gown, sterile gloves, large sterile sheet, hand hygiene and cutaneous antisepsis) and infiltrated locally with 1% Lidocaine.  Ultrasound demonstrated patency of the right brachial vein, and this was documented with an image.  Under real-time ultrasound guidance, this vein was accessed with a 21 gauge micropuncture needle and image documentation was performed.  The needle was exchanged over a guidewire for a peel-away sheath through which a five [place] dual lumen PICC trimmed to 52 cm was advanced, positioned with its tip at the lower SVC/right atrial junction. Fluoroscopy during the procedure and fluoro spot radiograph confirms appropriate catheter position.  The catheter was flushed, secured to the skin with Prolene sutures, and covered with a sterile dressing.  Complications:  None  IMPRESSION: Successful right arm PICC line placement with ultrasound and fluoroscopic guidance.  The catheter is ready for use.   Original Report Authenticated By: Richarda Overlie, M.D.    Dg Chest Portable 1 View  05/04/2012  *RADIOLOGY REPORT*  Clinical Data: Congestion, chills  PORTABLE CHEST - 1 VIEW  Comparison: 04/22/2012, 10/26/2010 CT  Findings: Prominent cardiomediastinal contours. Multiple small calcific densities projecting over the  right upper lung and scapula, likely correspond to post-traumatic changes described on prior CT. Mild dependent atelectasis.  No pleural effusion or pneumothorax.  Prior left rib fractures again noted.  Mild rightward thoracic spine curvature may be exaggerated by positioning. Right PICC tip projects over the proximal SVC.  IMPRESSION: Chronic changes as above.  Cardiomediastinal prominence is similar to prior.   Original Report Authenticated By: Jearld Lesch, M.D.    Dg Abd 2 Views  05/17/2012  *RADIOLOGY REPORT*  Clinical Data: Follow up of ileus with gastric distention.  ABDOMEN - 2 VIEW  Comparison: 05/13/2012  Findings: 2 right side up decubitus views.  These demonstrate no free intraperitoneal air or significant air fluid levels.  There is trace right-sided pleural fluid or thickening.  The supine view demonstrates placement of a nasogastric tube, which terminates in the antropyloric region.  Resolution of gastric distention.  Nonspecific small bowel dilatation within the right side of the abdomen up to 3.9 cm.  Colonic gas also identified. Distal stool.  Osseous fragmentation about the right acetabulum is suboptimally evaluated.  IMPRESSION: Placement of a nasogastric tube with resolution of gastric distention.  Nonspecific mild small bowel dilatation in the right side of the abdomen.   Original Report Authenticated By: Jeronimo Greaves, M.D.    Dg Abd Portable 1v  05/14/2012  *RADIOLOGY  REPORT*  Clinical Data: Vomiting and abdominal distension.  PORTABLE ABDOMEN - 1 VIEW  Comparison: Abdominal radiographs, the most recent of which was performed 05/11/2012  Findings: There is recurrent massive distension of the stomach, following removal of the patient's enteric tube.  As significant air is seen within the first and second segment of the duodenum, this may reflect gastroparesis or less likely obstruction at the level of the duodenum.  The visualized bowel gas pattern is otherwise grossly unremarkable. No  free intra-abdominal air is identified, though evaluation is limited on a supine views.  No acute osseous abnormalities are seen.  A small left pleural effusion is seen; mild bibasilar opacities likely reflect atelectasis.  IMPRESSION:  1.  Recurrent massive distension of the stomach, following removal of the patient's enteric tube.  As significant air is seen within the proximal duodenum, this may reflect gastroparesis, or less likely obstruction at the third segment of the duodenum.  Recommend immediate placement of enteric tube. 2.  Small left pleural effusion seen; mild bibasilar opacities likely reflect atelectasis.  These results were called by telephone on 05/13/2012 at 12:19 a.m. to [facility], who verbally acknowledged these results.   Original Report Authenticated By: Tonia Ghent, M.D.     Scheduled Meds:   . baclofen  20 mg Oral QID  . cefTAZidime (FORTAZ)  IV  2 g Intravenous Q8H  . docusate sodium  100 mg Oral BID  . enoxaparin  55 mg Subcutaneous Q24H  . famotidine  20 mg Oral BID  . lactose free nutrition  237 mL Oral BID BM  . metoCLOPramide (REGLAN) injection  10 mg Intravenous Q6H  . nutrition supplement  1 packet Oral BID BM  . potassium chloride  20 mEq Oral Daily  . vancomycin  1,500 mg Intravenous Q12H   Continuous Infusions:   . 0.9 % NaCl with KCl 20 mEq / L 50 mL/hr at 05/20/12 1351    Principal Problem:  *Sepsis Active Problems:  OSA (obstructive sleep apnea)  Obesity  Quadriplegia  Seizure disorder  HTN (hypertension)  Sacral decubitus ulcer, stage IV  Recurrent UTI    Time spent: 40 minutes   [facility]  [facility] Pager [phone]. If 8PM-8AM, please contact night-coverage at [email or web address], password [facility] 05/21/2012, 2:08 PM  LOS: 17 days

## 2012-05-21 NOTE — Progress Notes (Signed)
[name] [name] 10:56 AM  Subjective: Patient is doing better and tolerated breakfast without nausea or vomit and had normal bowel movements yesterday and has not had any abdominal pain and requests a regular diet  Objective: Vital signs stable afebrile no acute distress abdomen is soft nontender  Assessment: Improved ileus  Plan: Okay to advance diet happy to see back when necessary please call us if we can be of any further assistance otherwise in a few days if doing well will need to either change Reglan to by mouth or if he has not been on it in the past consider stopping it  [facility] E

## 2012-05-21 NOTE — Progress Notes (Signed)
6 Days Post-Op  Subjective: Pt reports he is feeling much improved and started back on a regular diet today.  He is hoping to DC to home tomorrow afternoon.  The VAC dressing is not keeping a seal due to macerated tissue and little normal tissue medially to obtain a seal. Will change to hydrogel to the operative site as the Acell appears to be incorporating well. Will not initially use VAC dressing at home, but may be able to use shortly again. Patient reports he already has an appointment for the wound care clinic next Monday and will re-assess if [facility] can be used again. Will also try to see pt before he is discharged tomorrow, to re-apply more Acell if we are able to obtain more Acell before discharge.   Objective: Vital signs in last 24 hours: Temp:  [97.9 F (36.6 C)-98.8 F (37.1 C)] 97.9 F (36.6 C) (01/06 1330) Pulse Rate:  [84-88] 84  (01/06 1330) Resp:  [18-20] 20  (01/06 1330) BP: (119-150)/(68-93) 150/93 mmHg (01/06 1330) SpO2:  [95 %-100 %] 100 % (01/06 1330) Last BM Date: 05/20/12  Intake/Output from previous day: 01/05 0701 - 01/06 0700 In: 1520 [P.O.:1520] Out: 5275 [Urine:4425; Drains:100; Stool:750] Intake/Output this shift: Total I/O In: 720 [P.O.:720] Out: 600 [Urine:600]  General appearance: alert, cooperative and no distress The right ischial wound has improved with decreased overall depth and the peri-wound has granulated in nicely nearly to skin level. There is maceration of the peri-wound area causing the VAC to quickly lose a seal. This was packed with hydrogel and gauze.   Lab Results:   Basename 05/20/12 0500  WBC 8.6  HGB 8.3*  HCT 26.8*  PLT 379   BMET  Basename 05/21/12 0500 05/20/12 0500  NA 140 139  K 3.7 3.0*  CL 104 102  CO2 26 27  GLUCOSE 99 95  BUN 9 7  CREATININE 0.38* 0.36*  CALCIUM 9.6 9.2   PT/INR No results found for this basename: LABPROT:2,INR:2 in the last 72 hours ABG No results found for this basename:  PHART:2,PCO2:2,PO2:2,HCO3:2 in the last 72 hours  Studies/Results: No results found.  Anti-infectives: Anti-infectives     Start     Dose/Rate Route Frequency Ordered Stop   05/17/12 1100   cefTAZidime (FORTAZ) 2 g in dextrose 5 % 50 mL IVPB        2 g 100 mL/hr over 30 Minutes Intravenous 3 times per day 05/17/12 1013     05/14/12 1649   polymyxin B 500,000 Units, bacitracin 50,000 Units in sodium chloride irrigation 0.9 % 500 mL irrigation  Status:  Discontinued          As needed 05/14/12 1649 05/14/12 1716   05/14/12 0600   ceFAZolin (ANCEF) IVPB 2 g/50 mL premix  Status:  Discontinued        2 g 100 mL/hr over 30 Minutes Intravenous On call to O.R. 05/13/12 1331 05/14/12 1903   05/08/12 1400   meropenem (MERREM) 1 g in sodium chloride 0.9 % 100 mL IVPB  Status:  Discontinued        1 g 200 mL/hr over 30 Minutes Intravenous 3 times per day 05/08/12 1332 05/17/12 1012   05/04/12 2200   vancomycin (VANCOCIN) 1,500 mg in sodium chloride 0.9 % 500 mL IVPB        1,500 mg 250 mL/hr over 120 Minutes Intravenous Every 12 hours 05/04/12 0909     05/04/12 1600   imipenem-cilastatin (PRIMAXIN) 500 mg in  sodium chloride 0.9 % 100 mL IVPB  Status:  Discontinued        500 mg 200 mL/hr over 30 Minutes Intravenous Every 6 hours 05/04/12 0909 05/08/12 1328   05/04/12 0915   vancomycin (VANCOCIN) 500 mg in sodium chloride 0.9 % 100 mL IVPB        500 mg 100 mL/hr over 60 Minutes Intravenous To Emergency Dept 05/04/12 0907 05/04/12 1126   05/04/12 0915   imipenem-cilastatin (PRIMAXIN) 500 mg in sodium chloride 0.9 % 100 mL IVPB        500 mg 200 mL/hr over 30 Minutes Intravenous To Emergency Dept 05/04/12 0907 05/04/12 1346   05/04/12 0615   vancomycin (VANCOCIN) IVPB 1000 mg/200 mL premix        1,000 mg 200 mL/hr over 60 Minutes Intravenous  Once 05/04/12 0611 05/04/12 0821   05/04/12 0615   piperacillin-tazobactam (ZOSYN) IVPB 3.375 g        3.375 g 12.5 mL/hr over 240 Minutes  Intravenous  Once 05/04/12 0611 05/04/12 1059          Assessment/Plan: s/p Procedure(s) (LRB) with comments: ESOPHAGOGASTRODUODENOSCOPY (EGD) (N/A) - 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 -  Continue dressing changes. VAC discontinued for now, but may be able to resume shortly following discharge, so would proceed with home VAC for now if possible.  Follow up in Wound Care Clinic next Monday morning with Dr. Kelly Splinter. Will try to obtain more Acell and apply tomorrow prior to discharge, but do not hold up discharge for this .  LOS: 17 days    Azile Minardi, PA-C 05/21/2012 [phone]

## 2012-05-21 NOTE — Progress Notes (Signed)
[facility]  Patient Status: Active with [facility]- [facility] is prepared to support pt discharged home for SN, wound care services and Infusion Pharmacy when deemed ready by physician.  Will await final IV antibiotic and wound care orders.   [facility] is providing the following services: SN, Infusion Pharmacy   If patient discharges after hours, please call [phone].   Sedalia Muta 05/21/2012, 5:32 PM

## 2012-05-21 NOTE — Progress Notes (Signed)
ANTIBIOTIC CONSULT NOTE - Follow-up  Pharmacy Consult for Ceftazidime and Vancomycin Indication: decubitus ulcer with chronic osteo  No Known Allergies  Patient Measurements: Height: 6' (182.9 cm) Weight: 246 lb 0.5 oz (111.6 kg) IBW/kg (Calculated) : 77.6   Vital Signs: Temp: 98.3 F (36.8 C) (01/06 0625) Temp src: Oral (01/05 2252) BP: 119/68 mmHg (01/06 0625) Pulse Rate: 87  (01/06 0625) Intake/Output from previous day: 01/05 0701 - 01/06 0700 In: 1520 [P.O.:1520] Out: 5275 [Urine:4425; Drains:100; Stool:750] Intake/Output from this shift: Total I/O In: 360 [P.O.:360] Out: -   Labs:  Basename 05/21/12 0500 05/20/12 0500  WBC -- 8.6  HGB -- 8.3*  PLT -- 379  LABCREA -- --  CREATININE 0.38* 0.36*   Estimated Creatinine Clearance: 150.4 ml/min (by C-G formula based on Cr of 0.38).  Basename 05/18/12 1315  VANCOTROUGH 16.0  VANCOPEAK --  VANCORANDOM --  GENTTROUGH --  GENTPEAK --  GENTRANDOM --  TOBRATROUGH --  TOBRAPEAK --  TOBRARND --  AMIKACINPEAK --  AMIKACINTROU --  AMIKACIN --    Microbiology: Recent Results (from the past 720 hour(s))  URINE CULTURE     Status: Normal   Collection Time   04/22/12  7:36 AM      Component Value Range Status Comment   Specimen Description URINE, CATHETERIZED   Final    Special Requests NONE   Final    Culture  Setup Time 04/22/2012 15:22   Final    Colony Count >=100,000 COLONIES/ML   Final    Culture     Final    Value: CITROBACTER FREUNDII     PSEUDOMONAS AERUGINOSA   Report Status 04/25/2012 FINAL   Final    Organism ID, Bacteria CITROBACTER FREUNDII   Final    Organism ID, Bacteria PSEUDOMONAS AERUGINOSA   Final   CULTURE, BLOOD (ROUTINE X 2)     Status: Normal   Collection Time   04/22/12  8:55 AM      Component Value Range Status Comment   Specimen Description BLOOD RIGHT ARM   Final    Special Requests BOTTLES DRAWN AEROBIC ONLY 4CC   Final    Culture  Setup Time 04/22/2012 15:26   Final    Culture NO  GROWTH 5 DAYS   Final    Report Status 04/28/2012 FINAL   Final   CULTURE, BLOOD (ROUTINE X 2)     Status: Normal   Collection Time   04/22/12  4:00 PM      Component Value Range Status Comment   Specimen Description BLOOD LEFT ARM   Final    Special Requests BOTTLES DRAWN AEROBIC AND ANAEROBIC 10CC   Final    Culture  Setup Time 04/22/2012 20:57   Final    Culture NO GROWTH 5 DAYS   Final    Report Status 04/30/2012 FINAL   Final   URINE CULTURE     Status: Normal   Collection Time   05/04/12  4:41 AM      Component Value Range Status Comment   Specimen Description URINE, CATHETERIZED   Final    Special Requests CX ADDED AT 0505 ON [id number]   Final    Culture  Setup Time 05/04/2012 05:29   Final    Colony Count >=100,000 COLONIES/ML   Final    Culture     Final    Value: PSEUDOMONAS AERUGINOSA     YEAST   Report Status 05/06/2012 FINAL   Final    Organism ID,  Bacteria PSEUDOMONAS AERUGINOSA   Final   WOUND CULTURE     Status: Normal   Collection Time   05/04/12  5:47 AM      Component Value Range Status Comment   Specimen Description WOUND BUTTOCK   Final    Special Requests Normal   Final    Gram Stain     Final    Value: MODERATE WBC PRESENT,BOTH PMN AND MONONUCLEAR     RARE SQUAMOUS EPITHELIAL CELLS PRESENT     FEW GRAM NEGATIVE RODS     RARE GRAM POSITIVE COCCI     IN PAIRS   Culture     Final    Value: FEW METHICILLIN RESISTANT STAPHYLOCOCCUS AUREUS     Note: RIFAMPIN AND GENTAMICIN SHOULD NOT BE USED AS SINGLE DRUGS FOR TREATMENT OF STAPH INFECTIONS. This organism is presumed to be Clindamycin resistant based on detection of inducible Clindamycin resistance. CRITICAL RESULT CALLED TO, READ BACK BY AND      VERIFIED WITH: [name] 05/07/12 1310 [name]   Report Status 05/07/2012 FINAL   Final    Organism ID, Bacteria METHICILLIN RESISTANT STAPHYLOCOCCUS AUREUS   Final   CULTURE, BLOOD (ROUTINE X 2)     Status: Normal   Collection Time   05/04/12  9:50 AM       Component Value Range Status Comment   Specimen Description BLOOD HAND LEFT   Final    Special Requests BOTTLES DRAWN AEROBIC ONLY 10CC   Final    Culture  Setup Time 05/04/2012 20:22   Final    Culture NO GROWTH 5 DAYS   Final    Report Status 05/10/2012 FINAL   Final   CULTURE, BLOOD (ROUTINE X 2)     Status: Normal   Collection Time   05/04/12 10:00 AM      Component Value Range Status Comment   Specimen Description BLOOD HAND LEFT   Final    Special Requests     Final    Value: BOTTLES DRAWN AEROBIC AND ANAEROBIC BLUE 10CC  RED 6CC   Culture  Setup Time 05/04/2012 15:27   Final    Culture NO GROWTH 5 DAYS   Final    Report Status 05/10/2012 FINAL   Final   MRSA PCR SCREENING     Status: Normal   Collection Time   05/04/12  5:00 PM      Component Value Range Status Comment   MRSA by PCR NEGATIVE  NEGATIVE Final   TISSUE CULTURE     Status: Normal   Collection Time   05/14/12  4:23 PM      Component Value Range Status Comment   Specimen Description TISSUE BONE   Final    Special Requests PATIENT ON FOLLOWING VANCOMYCIN NO 1 RIGHT ISHIUM   Final    Gram Stain     Final    Value: MODERATE WBC PRESENT,BOTH PMN AND MONONUCLEAR     NO ORGANISMS SEEN   Culture FEW PSEUDOMONAS AERUGINOSA   Final    Report Status 05/17/2012 FINAL   Final    Organism ID, Bacteria PSEUDOMONAS AERUGINOSA   Final   ANAEROBIC CULTURE     Status: Normal (Preliminary result)   Collection Time   05/14/12  4:23 PM      Component Value Range Status Comment   Specimen Description TISSUE BONE   Final    Special Requests PATIENT ON FOLLOWING VANCOMYCIN NO 1 RIGHT ISHIUM   Final  Gram Stain PENDING   Incomplete    Culture     Final    Value: NO ANAEROBES ISOLATED; CULTURE IN PROGRESS FOR 5 DAYS   Report Status PENDING   Incomplete   TISSUE CULTURE     Status: Normal   Collection Time   05/14/12  4:36 PM      Component Value Range Status Comment   Specimen Description TISSUE BONE   Final    Special  Requests PATIENT ON FOLLOWING VANCOMYCIN NO 3 FEMUR   Final    Gram Stain     Final    Value: NO WBC SEEN     NO ORGANISMS SEEN   Culture     Final    Value: FEW PSEUDOMONAS AERUGINOSA     Note: SUSCEPTIBILITIES PERFORMED ON PREVIOUS CULTURE WITHIN THE LAST 5 DAYS. REFER TO CULTURE [id number]     FEW ENTEROCOCCUS SPECIES   Report Status 05/18/2012 FINAL   Final    Organism ID, Bacteria ENTEROCOCCUS SPECIES   Final   ANAEROBIC CULTURE     Status: Normal (Preliminary result)   Collection Time   05/14/12  4:36 PM      Component Value Range Status Comment   Specimen Description TISSUE BONE   Final    Special Requests PATIENT ON FOLLOWING VANCOMYCIN NO 3 FEMUR   Final    Gram Stain PENDING   Incomplete    Culture     Final    Value: NO ANAEROBES ISOLATED; CULTURE IN PROGRESS FOR 5 DAYS   Report Status PENDING   Incomplete   TISSUE CULTURE     Status: Normal   Collection Time   05/14/12  5:00 PM      Component Value Range Status Comment   Specimen Description TISSUE BONE HIP RIGHT   Final    Special Requests PATIENT ON FOLLOWING VANCOMYCIN NO 2 POSTERIOR   Final    Gram Stain     Final    Value: NO WBC SEEN     NO ORGANISMS SEEN   Culture     Final    Value: FEW PSEUDOMONAS AERUGINOSA     Note: SUSCEPTIBILITIES PERFORMED ON PREVIOUS CULTURE WITHIN THE LAST 5 DAYS. REFER TO CULTURE [id number]   Report Status 05/17/2012 FINAL   Final   ANAEROBIC CULTURE     Status: Normal (Preliminary result)   Collection Time   05/14/12  5:00 PM      Component Value Range Status Comment   Specimen Description TISSUE BONE HIP RIGHT   Final    Special Requests PATIENT ON FOLLOWING VANCOMYCIN NO 2 POSTERIOR   Final    Gram Stain     Final    Value: NO WBC SEEN     NO ORGANISMS SEEN   Culture     Final    Value: NO ANAEROBES ISOLATED; CULTURE IN PROGRESS FOR 5 DAYS   Report Status PENDING   Incomplete    Assessment: 47 y/o male patient on vanc + ceftazidime. Pt will require 6 to 8 weeks of IV abx. Most  recent Vanc trough on 12/25 was 20.2 mcg/ml at steady state  (Goal 15-20). Pt is afebrile and WBC is 11. Vanc trough check today and equal to 16 which is therapeutic.  His creatinine is stable 0.38 and he has an estimated clearance > 100 ml/min.  12/20 Wound >>MRSA (vanc MIC = 1) 12/20 Blood x 2>>Negative 12/20 Urine >>pseudomonas - pan sensitive 12/30 tissue culture (femur)>>pseudomonas sensitive to  ceftazidime, pip/tazo, gent, tobra; enterococcus sensitive to amp and vancomycin  Goal of Therapy:  Vanc trough 15-20  Plan:  1. Continue Ceftazidime 2gm IV Q8H 2. Continue Vancomycin 1.5g IV q12h 3. F/u renal fxn, clinical status   Nadara Mustard, PharmD., MS Clinical Pharmacist Pager:  [phone] Thank you for allowing pharmacy to be part of this patients care team.  05/21/2012 10:40 AM

## 2012-05-22 MED ORDER — VANCOMYCIN HCL 10 G IV SOLR: 1500.0000 mg | Freq: Two times a day (BID) | INTRAVENOUS | Status: AC

## 2012-05-22 MED ORDER — HEPARIN SOD (PORK) LOCK FLUSH 100 UNIT/ML IV SOLN
250.0000 [IU] | INTRAVENOUS | Status: DC | PRN
  Filled 2012-05-22: qty 3

## 2012-05-22 MED ORDER — DEXTROSE 5 % IV SOLN: 2.0000 g | Freq: Three times a day (TID) | INTRAVENOUS | Status: AC

## 2012-05-22 MED ORDER — METOCLOPRAMIDE HCL 10 MG PO TABS: 10.0000 mg | ORAL_TABLET | Freq: Three times a day (TID) | ORAL | Status: DC

## 2012-05-22 MED ORDER — HEPARIN SOD (PORK) LOCK FLUSH 100 UNIT/ML IV SOLN
250.0000 [IU] | Freq: Every day | INTRAVENOUS | Status: DC
  Filled 2012-05-22: qty 3

## 2012-05-22 NOTE — Progress Notes (Signed)
Patient discharge to home transported by EMS. Alert and oriented, not in any distress. Wound vac supplies taken by his friend/caregiver.

## 2012-05-22 NOTE — Discharge Summary (Signed)
Physician Discharge Summary  Elo [name] MRN: [id number] DOB/AGE: 1965/07/07 47 y.o.  PCP: [name], MD   Admit date: 05/04/2012 Discharge date: 05/22/2012  Discharge Diagnoses:     *Sepsis Active Problems:  OSA (obstructive sleep apnea)  Obesity  Quadriplegia  Seizure disorder  HTN (hypertension)  Sacral decubitus ulcer, stage IV  Recurrent UTI Irrigation and debridement of sacral ulcer     Discharge Condition: Stable  Disposition: 01-Home or Self Care   Consults:  #1 gastroenterology #2 plastic surgery   Significant Diagnostic Studies: Dg Abd 1 View  05/11/2012  *RADIOLOGY REPORT*  Clinical Data: Ileus  ABDOMEN - 1 VIEW  Comparison: 05/10/2012  Findings: Tube tip remains in the stomach.  The stomach is distended.  Gas filled loops of small and large bowel are present without disproportionate dilatation.  Pelvic deformity is again noted.  IMPRESSION: Gastric distention has developed despite NG tube position in the stomach. Low wall suction may be helpful.  No evidence of small bowel obstruction or small bowel ileus.   Original Report Authenticated By: Jolaine Click, M.D.    Dg Abd 1 View  05/10/2012  *RADIOLOGY REPORT*  Clinical Data: Nausea  ABDOMEN - 1 VIEW  Comparison: Yesterday  Findings: NG tube placed.  Gastric distention has resolved.  No disproportionate dilatation of bowel. 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  IMPRESSION: Gastric distention resolved after NG tube placement.   Original Report Authenticated By: Jolaine Click, M.D.    Dg Abd 1 View  05/09/2012  *RADIOLOGY REPORT*  Clinical Data: Vomiting  ABDOMEN - 1 VIEW  Comparison: CT 02/29/2012  Findings: There is massive gaseous distention of the stomach to the approximate level of the duodenal bulb.  Remote fracture deformity with probable neuropathic change proximal right femur and pelvis, and changed.  No  small or large bowel dilatation.  No free air accounting for supine technique.  IMPRESSION: Massive gastric gaseous distention which could reflect gastric outlet obstruction, gastroparesis, or ingested air less likely.   Original Report Authenticated By: Christiana Pellant, M.D.    Mr Pelvis W Wo Contrast  05/04/2012  *RADIOLOGY REPORT*  Clinical Data: Sacral decubitus ulcer with foul-smelling discharge. History of osteomyelitis treated with antibiotics.  MRI PELVIS WITHOUT AND WITH CONTRAST  Technique:  Multiplanar multisequence MR imaging of the pelvis was performed both before and after administration of intravenous contrast.  Contrast: 20mL MULTIHANCE GADOBENATE DIMEGLUMINE 529 MG/ML IV SOLN  Comparison: Pelvic CTs 02/29/2012 and 01/18/2012.  Findings: There is chronic dislocation of the right hip and chronic extensive fragmentation of the right femoral head and neck.  There is extensive heterotopic ossification surrounding the dislocated femoral head.  There is a deep decubitus ulcer posterior to the dislocated proximal femur which extends to the bone as on the prior CT. There are multiple peripherally enhancing fluid collections associated with the dislocated femoral head and lateral to the proximal femur.  The latter are incompletely imaged.  There is diffuse surrounding synovial and muscular enhancement. Edema and enhancement track along the right iliacus muscle.  There is irregular sclerosis, edema and enhancement within the proximal right femur.  Similar involvement is present within the right iliac bone and acetabulum.  There is chronic deformity of the right ischium.  The sacroiliac joints are chronically fused without adjacent marrow edema.  There is chronic deformity of the proximal left femur with surrounding heterotopic ossification.  The left femur demonstrates  no suspicious marrow signal or enhancement.  The patient has a descending colostomy and indwelling bladder catheter.  Multiple prominent  pelvic and inguinal lymph nodes are present, likely reactive and similar to prior CT.  No intrapelvic fluid collections are identified.  IMPRESSION:  1.  Chronic dislocation of the right hip and fragmentation of the proximal right femur are similar to prior CT.  Comparison between modalities is limited. 2.  Sclerosis, edema and enhancement within the proximal femur and adjacent acetabulum are consistent with chronic osteomyelitis. Superimposed acute osteomyelitis is likely. 3.  Extensive inflammatory changes surrounding the right hip with large irregular peripherally enhancing fluid collections, synovial enhancement and muscular enhancement adjacent to a large decubitus ulcer.  These findings are consistent with chronic soft tissue infection with abscess formation.   Original Report Authenticated By: Carey Bullocks, M.D.    Ir Fluoro Guide Cv Line Right  04/22/2012  *RADIOLOGY REPORT*  Clinical Data: 47 year old with sepsis.  PICC LINE PLACEMENT WITH ULTRASOUND AND FLUOROSCOPIC  GUIDANCE  Fluoroscopy Time: 1.1 minutes.  The right arm was prepped with chlorhexidine, draped in the usual sterile fashion using maximum barrier technique (cap and mask, sterile gown, sterile gloves, large sterile sheet, hand hygiene and cutaneous antisepsis) and infiltrated locally with 1% Lidocaine.  Ultrasound demonstrated patency of the right brachial vein, and this was documented with an image.  Under real-time ultrasound guidance, this vein was accessed with a 21 gauge micropuncture needle and image documentation was performed.  The needle was exchanged over a guidewire for a peel-away sheath through which a five [place] dual lumen PICC trimmed to 52 cm was advanced, positioned with its tip at the lower SVC/right atrial junction. Fluoroscopy during the procedure and fluoro spot radiograph confirms appropriate catheter position.  The catheter was flushed, secured to the skin with Prolene sutures, and covered with a sterile dressing.   Complications:  None  IMPRESSION: Successful right arm PICC line placement with ultrasound and fluoroscopic guidance.  The catheter is ready for use.   Original Report Authenticated By: Richarda Overlie, M.D.    Ir US Guide Vasc Access Right  04/22/2012  *RADIOLOGY REPORT*  Clinical Data: 47 year old with sepsis.  PICC LINE PLACEMENT WITH ULTRASOUND AND FLUOROSCOPIC  GUIDANCE  Fluoroscopy Time: 1.1 minutes.  The right arm was prepped with chlorhexidine, draped in the usual sterile fashion using maximum barrier technique (cap and mask, sterile gown, sterile gloves, large sterile sheet, hand hygiene and cutaneous antisepsis) and infiltrated locally with 1% Lidocaine.  Ultrasound demonstrated patency of the right brachial vein, and this was documented with an image.  Under real-time ultrasound guidance, this vein was accessed with a 21 gauge micropuncture needle and image documentation was performed.  The needle was exchanged over a guidewire for a peel-away sheath through which a five [place] dual lumen PICC trimmed to 52 cm was advanced, positioned with its tip at the lower SVC/right atrial junction. Fluoroscopy during the procedure and fluoro spot radiograph confirms appropriate catheter position.  The catheter was flushed, secured to the skin with Prolene sutures, and covered with a sterile dressing.  Complications:  None  IMPRESSION: Successful right arm PICC line placement with ultrasound and fluoroscopic guidance.  The catheter is ready for use.   Original Report Authenticated By: Richarda Overlie, M.D.    Dg Chest Portable 1 View  05/04/2012  *RADIOLOGY REPORT*  Clinical Data: Congestion, chills  PORTABLE CHEST - 1 VIEW  Comparison: 04/22/2012, 10/26/2010 CT  Findings: Prominent cardiomediastinal contours. Multiple small calcific densities  projecting over the right upper lung and scapula, likely correspond to post-traumatic changes described on prior CT. Mild dependent atelectasis.  No pleural effusion or pneumothorax.   Prior left rib fractures again noted.  Mild rightward thoracic spine curvature may be exaggerated by positioning. Right PICC tip projects over the proximal SVC.  IMPRESSION: Chronic changes as above.  Cardiomediastinal prominence is similar to prior.   Original Report Authenticated By: Jearld Lesch, M.D.    Dg Abd 2 Views  05/17/2012  *RADIOLOGY REPORT*  Clinical Data: Follow up of ileus with gastric distention.  ABDOMEN - 2 VIEW  Comparison: 05/13/2012  Findings: 2 right side up decubitus views.  These demonstrate no free intraperitoneal air or significant air fluid levels.  There is trace right-sided pleural fluid or thickening.  The supine view demonstrates placement of a nasogastric tube, which terminates in the antropyloric region.  Resolution of gastric distention.  Nonspecific small bowel dilatation within the right side of the abdomen up to 3.9 cm.  Colonic gas also identified. Distal stool.  Osseous fragmentation about the right acetabulum is suboptimally evaluated.  IMPRESSION: Placement of a nasogastric tube with resolution of gastric distention.  Nonspecific mild small bowel dilatation in the right side of the abdomen.   Original Report Authenticated By: Jeronimo Greaves, M.D.    Dg Abd Portable 1v  05/14/2012  *RADIOLOGY REPORT*  Clinical Data: Vomiting and abdominal distension.  PORTABLE ABDOMEN - 1 VIEW  Comparison: Abdominal radiographs, the most recent of which was performed 05/11/2012  Findings: There is recurrent massive distension of the stomach, following removal of the patient's enteric tube.  As significant air is seen within the first and second segment of the duodenum, this may reflect gastroparesis or less likely obstruction at the level of the duodenum.  The visualized bowel gas pattern is otherwise grossly unremarkable. No free intra-abdominal air is identified, though evaluation is limited on a supine views.  No acute osseous abnormalities are seen.  A small left pleural effusion  is seen; mild bibasilar opacities likely reflect atelectasis.  IMPRESSION:  1.  Recurrent massive distension of the stomach, following removal of the patient's enteric tube.  As significant air is seen within the proximal duodenum, this may reflect gastroparesis, or less likely obstruction at the third segment of the duodenum.  Recommend immediate placement of enteric tube. 2.  Small left pleural effusion seen; mild bibasilar opacities likely reflect atelectasis.  These results were called by telephone on 05/13/2012 at 12:19 a.m. to Nursing on [facility], who verbally acknowledged these results.   Original Report Authenticated By: Tonia Ghent, M.D.        Microbiology: Recent Results (from the past 240 hour(s))  TISSUE CULTURE     Status: Normal   Collection Time   05/14/12  4:23 PM      Component Value Range Status Comment   Specimen Description TISSUE BONE   Final    Special Requests PATIENT ON FOLLOWING VANCOMYCIN NO 1 RIGHT ISHIUM   Final    Gram Stain     Final    Value: MODERATE WBC PRESENT,BOTH PMN AND MONONUCLEAR     NO ORGANISMS SEEN   Culture FEW PSEUDOMONAS AERUGINOSA   Final    Report Status 05/17/2012 FINAL   Final    Organism ID, Bacteria PSEUDOMONAS AERUGINOSA   Final   ANAEROBIC CULTURE     Status: Normal   Collection Time   05/14/12  4:23 PM      Component Value Range  Status Comment   Specimen Description TISSUE BONE   Final    Special Requests PATIENT ON FOLLOWING VANCOMYCIN NO 1 RIGHT ISHIUM   Final    Gram Stain     Final    Value: MODERATE WBC PRESENT,BOTH PMN AND MONONUCLEAR     NO ORGANISMS SEEN   Culture     Final    Value: BACTEROIDES THETAIOTAOMICRON     Note: BETA LACTAMASE POSITIVE   Report Status 05/21/2012 FINAL   Final   TISSUE CULTURE     Status: Normal   Collection Time   05/14/12  4:36 PM      Component Value Range Status Comment   Specimen Description TISSUE BONE   Final    Special Requests PATIENT ON FOLLOWING VANCOMYCIN NO 3 FEMUR   Final     Gram Stain     Final    Value: NO WBC SEEN     NO ORGANISMS SEEN   Culture     Final    Value: FEW PSEUDOMONAS AERUGINOSA     Note: SUSCEPTIBILITIES PERFORMED ON PREVIOUS CULTURE WITHIN THE LAST 5 DAYS. REFER TO CULTURE [id number]     FEW ENTEROCOCCUS SPECIES   Report Status 05/18/2012 FINAL   Final    Organism ID, Bacteria ENTEROCOCCUS SPECIES   Final   ANAEROBIC CULTURE     Status: Normal   Collection Time   05/14/12  4:36 PM      Component Value Range Status Comment   Specimen Description TISSUE BONE   Final    Special Requests PATIENT ON FOLLOWING VANCOMYCIN NO 3 FEMUR   Final    Gram Stain     Final    Value: NO WBC SEEN     NO ORGANISMS SEEN   Culture     Final    Value: BACTEROIDES THETAIOTAOMICRON     Note: BETA LACTAMASE POSITIVE   Report Status 05/21/2012 FINAL   Final   TISSUE CULTURE     Status: Normal   Collection Time   05/14/12  5:00 PM      Component Value Range Status Comment   Specimen Description TISSUE BONE HIP RIGHT   Final    Special Requests PATIENT ON FOLLOWING VANCOMYCIN NO 2 POSTERIOR   Final    Gram Stain     Final    Value: NO WBC SEEN     NO ORGANISMS SEEN   Culture     Final    Value: FEW PSEUDOMONAS AERUGINOSA     Note: SUSCEPTIBILITIES PERFORMED ON PREVIOUS CULTURE WITHIN THE LAST 5 DAYS. REFER TO CULTURE [id number]   Report Status 05/17/2012 FINAL   Final   ANAEROBIC CULTURE     Status: Normal   Collection Time   05/14/12  5:00 PM      Component Value Range Status Comment   Specimen Description TISSUE BONE HIP RIGHT   Final    Special Requests PATIENT ON FOLLOWING VANCOMYCIN NO 2 POSTERIOR   Final    Gram Stain     Final    Value: NO WBC SEEN     NO ORGANISMS SEEN   Culture     Final    Value: BACTEROIDES THETAIOTAOMICRON     Note: BETA LACTAMASE POSITIVE   Report Status 05/21/2012 FINAL   Final      Labs: Results for orders placed during the hospital encounter of 05/04/12 (from the past 48 hour(s))  BASIC METABOLIC PANEL     Status:  Abnormal  Collection Time   05/21/12  5:00 AM      Component Value Range Comment   Sodium 140  135 - 145 mEq/L    Potassium 3.7  3.5 - 5.1 mEq/L DELTA CHECK NOTED   Chloride 104  96 - 112 mEq/L    CO2 26  19 - 32 mEq/L    Glucose, Bld 99  70 - 99 mg/dL    BUN 9  6 - 23 mg/dL    Creatinine, Ser 9.60 (*) 0.50 - 1.35 mg/dL    Calcium 9.6  8.4 - 45.4 mg/dL    GFR calc non Af Amer >90  >90 mL/min    GFR calc Af Amer >90  >90 mL/min   MAGNESIUM     Status: Normal   Collection Time   05/21/12  5:00 AM      Component Value Range Comment   Magnesium 1.5  1.5 - 2.5 mg/dL      HPI :*47 year old quadriplegic African American male with history of chronic sacral diabetes ulcer stage IV and left knee wound, with diverting colostomy and chronic suprapubic catheter, who has had history of possible eye to send her current UTIs and soft tissue infection presents to the hospital with sepsis most likely due to infection around the sacral decubitus ulcer with osteomyelitis and possible abscess.  He has, followed, with plastic surgeon Dr. Helyn Numbers, he was seen here by Dr. Rennis Chris orthopedic surgery who differed the case to Dr. Helyn Numbers for management of her sacral decubiti ulcer,    HOSPITAL COURSE:  Sacral decubitus ulcer Hersel Mcmeen is an 47 y.o. male. 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 Ninetta Lights ron 03/07/12. Patient had been suffering from recurrent urinary tract infections and sacral pressure sores for many years. He was dmitted 9-3 to 9-10 with a sacral decub present for last 1.5 years. . He was found to be febrile and hypotensive upon admission. He did not have bone bx but did have CT scan showing:  Prominent sclerotic change in both ischial tuberosities deep to soft tissue changes consistent with decubitus ulcers. While bony mineralization in the anatomic pelvis is diffusely heterogeneous, this level of sclerosis would be consistent with age  indeterminate bony infection. The dysmorphic, chronically dislocated right hip has a gas filled tract which extends from the posterior skin deep to the level of the dysmorphic femoral head. The gas can be seen tracking to within 2 mm of the cortical bone which shows some underlying focal sclerosis and irregularity. Bony infection at this level is considered likely. 3.2 x 3.6 cm low attenuating apparent fluid collection is seen just inferomedial to the dysmorphic right femoral head. Although there is no rim enhancement with gas within the collection, superinfection is not excluded by imaging.  In hospital had negative BCx, UCx 20k E coli. He had an aspirate of his hip (Cx -). He was treated with vanco/merrem in hospital then for 6 weeks of rx total.  Completed anbx on 10-16 (44 days).  He had repeat CT scan on 10-16 showing: Suspected acute on chronic osteomyelitis of the right hip, grossly unchanged. Associated decubitus ulcer. 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  Dr. Ninetta Lights placed him on chronic suppressive doxycline at that point.  Patient has subsequently been readmitted early in December what was which what was thought to be a complicated urinary tract infection. Cultures from the suprapubic catheter yielded Pseudomonas aeruginosa that was resistant to imipenem  and intermediate to ciprofloxacin but sensitive to cefepime ceftaz edema is gentamicin and tobramycin, as well as Citrobacter which was resistant to cefazolin but sensitive otherwise to ceftriaxone ciprofloxacin gentamicin levofloxacin nitrofurantoin and Zosyn tobramycin and Bactrim. Patient was treated with IV antibiotics ultimately changed to cefepime and discharged to skilled nursing facility on cefepime on December 12 . He was readmitted on 05/04/12 with nausea chills shaking chills subjective fevers. He was found in ER to be hypotensive with a systolic blood pressure in the 90s tachycardic and  with a white cell count 17,000. He was having copious foul-smelling discharge in the sacral decubitus ulcer. MRI was done that showed IMPRESSION:  1. Chronic dislocation of the right hip and fragmentation of the  proximal right femur are similar to prior CT. Comparison between  modalities is limited.  2. Sclerosis, edema and enhancement within the proximal femur and  adjacent acetabulum are consistent with chronic osteomyelitis.  Superimposed acute osteomyelitis is likely.  3. Extensive inflammatory changes surrounding the right hip with  large irregular peripherally enhancing fluid collections, synovial  enhancement and muscular enhancement adjacent to a large decubitus  ulcer. These findings are consistent with chronic soft tissue  infection with abscess formation.   He was started on imipenem and vancomycin after blood cultures and urine cultures were obtained as well as wound cultures. Infectious disease was consulted to assist in the management work this patient with possible sepsis from his purulent decubitus ulcer.   #1 sepsis: Treated with vancomycin and imipenem for now and that he was recently on cefepime and developed this septic picture on that antibiotic. He underwent IRRIGATION AND DEBRIDEMENT WOUND (Right) - Irrigation and Debridement of Sacral Ulcer with Placement of Acell and Wound Vac on 05/14/12 Dr. Kelly Splinter. Patient was followed by her PA recommends continuation of the wound VAC  The VAC dressing is not keeping a seal due to macerated tissue and little normal tissue medially to obtain a seal. Will change to hydrogel to the operative site as the Acell appears to be incorporating well. Will not initially use VAC dressing at home, but may be able to use shortly again. Patient reports he already has an appointment for the wound care clinic next Monday and will re-assess if [facility] can be used again. Will also try to see pt before he is discharged tomorrow, to re-apply more Acell if we  are able to obtain more Acell before discharge  Biopsy & cultures of 05/14/2012 sacral decub ulcer: Pseudomonas Sensitive to Pip/tazo and ceftazidime and Staph aureus sensitive to ampicillin.  - Vanc and fortaz since 05/17/2012. He will continue this until 06/15/12 and followup with infectious disease - Blood culture on 05/05/2012 negative till date.   Nausea and vomiting secondary to ileus: He is status post endoscopy that shows mild esophagitis. It shows dilated stomach consistent with his abdominal KUB nor retained food no gastric or duodenal outlet obstruction. He had nasogastric tube placed  - NG removed yesterday. Diet advanced to full liquid diet, diet advancement as per GI.  - EGD 05/15/2012: mild erosive esophagitis with some semi-confluent exudate distally, no stricture or definite ulcer  - NG tube continues to have large amount of drainage. May have ice chips. Once Ng drainage improve will clamp.  - Reglan for 4-6 weeks.  Okay to advance diet  Quadriplegia: - With colostomy, suprapubic catheter which is changed every 2 months, urologist Dr. Isabel Caprice. No acute issues continusupportive care.  Seizure disorder: - None inpatient, monitor.  History of recurrent  UTIs:  - UTI versus colonization, noted cultures, antibiotics for #1 above will suffice.  Hypokalemia  Replace as needed.   HTN (hypertension): - Continue current regimen.    Code Status: Full  Family Communication: Discussed with the patient  Disposition Plan: Home with home health Consultants:  ID  Orthopedics  Dr. Kelly Splinter  Procedures:  12.20.2013: Chronic dislocation of the right hip and fragmentation of the proximal right femur Sclerosis, edema and enhancement within the proximal femur and adjacent acetabulum are consistent with chronic osteomyelitis. Superimposed acute osteomyelitis is likely.  - Extensive inflammatory changes surrounding the right hip with large irregular peripherally enhancing fluid collections,  synovial  enhancement and muscular enhancement adjacent to a large decubitus ulcer. These findings are consistent with chronic soft tissue infection with abscess formation.  Antibiotics:  Vanc 05/03/2012 >>  Imipenem 05/03/2012 >> 05/16/2012  Elita Quick 05/17/2012 >>  HPI/Subjective:  Stable overnight     Discharge Exam:  Blood pressure 110/58, pulse 87, temperature 97.7 F (36.5 C), temperature source Oral, resp. rate 18, height 6' (1.829 m), weight 111.6 kg (246 lb 0.5 oz), SpO2 95.00%.  General: Alert and awake, oriented x3, morbidly obese he became diaphoretic while we did the exam of his wound  HEENT: anicteric sclera, pupils reactive to light and accommodation, EOMI, oropharynx clear and without exudate  CVS tachycardic rate, normal r, no murmur rubs or gallops  Chest: clear to auscultation bilaterally, no wheezing, rales or rhonchi  Abdomen: soft nontender, nondistended, his ostomy bag is full of brown stool  Skin: He has a large stage IV decubitus ulcers present one on the right goes obviously down to bone and there is copious foul-smelling feculent material that is draining from the wound.  Neuro: Quadriplegic        Discharge Orders    Future Appointments: Provider: Department: Dept Phone: Center:   06/11/2012 8:00 AM [facility] [phone] [facility]   09/05/2012 2:30 PM Ginnie Smart, MD [facility] [phone] [facility]     Future Orders Please Complete By Expires   Diet - low sodium heart healthy      Increase activity slowly         Follow-up Information    Follow up with [facility]. In 1 week.   Contact information:   [address], [address] [place] [address] [phone]         Signed: Richarda Overlie 05/22/2012, 9:44 AM

## 2012-05-28 ENCOUNTER — Encounter (HOSPITAL_BASED_OUTPATIENT_CLINIC_OR_DEPARTMENT_OTHER): Payer: Medicare Other | Attending: Plastic Surgery

## 2012-05-28 DIAGNOSIS — L89309 Pressure ulcer of unspecified buttock, unspecified stage: Secondary | ICD-10-CM | POA: Insufficient documentation

## 2012-05-28 DIAGNOSIS — L8994 Pressure ulcer of unspecified site, stage 4: Secondary | ICD-10-CM | POA: Insufficient documentation

## 2012-06-04 ENCOUNTER — Encounter (HOSPITAL_COMMUNITY): Payer: Self-pay

## 2012-06-04 ENCOUNTER — Emergency Department (HOSPITAL_COMMUNITY)
Admission: EM | Admit: 2012-06-04 | Discharge: 2012-06-04 | Disposition: A | Discharge status: Home / Self Care | Payer: Medicare Other | Attending: Emergency Medicine | Admitting: Emergency Medicine

## 2012-06-04 DIAGNOSIS — Z8673 Personal history of transient ischemic attack (TIA), and cerebral infarction without residual deficits: Secondary | ICD-10-CM | POA: Insufficient documentation

## 2012-06-04 DIAGNOSIS — Y838 Other surgical procedures as the cause of abnormal reaction of the patient, or of later complication, without mention of misadventure at the time of the procedure: Secondary | ICD-10-CM | POA: Insufficient documentation

## 2012-06-04 DIAGNOSIS — Z79899 Other long term (current) drug therapy: Secondary | ICD-10-CM | POA: Insufficient documentation

## 2012-06-04 DIAGNOSIS — Z8719 Personal history of other diseases of the digestive system: Secondary | ICD-10-CM | POA: Insufficient documentation

## 2012-06-04 DIAGNOSIS — Z8669 Personal history of other diseases of the nervous system and sense organs: Secondary | ICD-10-CM | POA: Insufficient documentation

## 2012-06-04 DIAGNOSIS — G4733 Obstructive sleep apnea (adult) (pediatric): Secondary | ICD-10-CM | POA: Insufficient documentation

## 2012-06-04 DIAGNOSIS — Z452 Encounter for adjustment and management of vascular access device: Secondary | ICD-10-CM

## 2012-06-04 DIAGNOSIS — Z8739 Personal history of other diseases of the musculoskeletal system and connective tissue: Secondary | ICD-10-CM | POA: Insufficient documentation

## 2012-06-04 DIAGNOSIS — Z8619 Personal history of other infectious and parasitic diseases: Secondary | ICD-10-CM | POA: Insufficient documentation

## 2012-06-04 DIAGNOSIS — T82598A Other mechanical complication of other cardiac and vascular devices and implants, initial encounter: Secondary | ICD-10-CM | POA: Insufficient documentation

## 2012-06-04 DIAGNOSIS — Z872 Personal history of diseases of the skin and subcutaneous tissue: Secondary | ICD-10-CM | POA: Insufficient documentation

## 2012-06-04 DIAGNOSIS — Z87440 Personal history of urinary (tract) infections: Secondary | ICD-10-CM | POA: Insufficient documentation

## 2012-06-04 DIAGNOSIS — D509 Iron deficiency anemia, unspecified: Secondary | ICD-10-CM | POA: Insufficient documentation

## 2012-06-04 DIAGNOSIS — I10 Essential (primary) hypertension: Secondary | ICD-10-CM | POA: Insufficient documentation

## 2012-06-04 MED ORDER — ALTEPLASE 2 MG IJ SOLR
2.0000 mg | Freq: Once | INTRAMUSCULAR | Status: AC
  Administered 2012-06-04: 2 mg
  Filled 2012-06-04: qty 2

## 2012-06-04 NOTE — ED Notes (Signed)
Per EMS- Patient is from home. Patient reports that 4 days ago the home health nurse stated that his PICC line was partially clotted and today it is completely clotted. Patient called EMS.

## 2012-06-04 NOTE — ED Notes (Signed)
Patient is alert and oriented x3.  He was given DC instructions and follow up visit instructions.  Patient gave verbal understanding.  He was DC ambulatory under his own power to home.  V/S stable.  He was not showing any signs of distress on DC 

## 2012-06-04 NOTE — Progress Notes (Signed)
Came in with inability to aspirate from either port of RT DL PICC.  TPA done per protocol with success.   5cc wasted from each port and flushed with 10cc NS each.  Clear drsg D/I.  Primary RN made aware PICC is working properly.
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
## 2012-06-06 ENCOUNTER — Telehealth: Payer: Self-pay | Admitting: *Deleted

## 2012-06-06 NOTE — Telephone Encounter (Signed)
Called the patient to advise him of his follow up appt with Dr Orvan Falconer for 06/12/12 at 4 pm and gave him the address. The patient seemed confused so I asked him if he knew why he was seeing [place] and he said yes. Reverified the information and gave him the phone number just in case and ended the call.

## 2012-06-06 NOTE — Telephone Encounter (Signed)
Amy called to ask about this patient Stop date for his IV antibiotics. She advised she was given 06/14/12 for his Vanc and 06/21/12 for his Elita Quick. Advised her the patient needs a follow up visit. After speaking with the office manager gave her 06/12/12 at 4 pm with Dr Orvan Falconer and his stop date will be decided at that time. Advised will call the patient and let him know also.  Amy contact info is [phone]

## 2012-06-11 ENCOUNTER — Encounter (HOSPITAL_BASED_OUTPATIENT_CLINIC_OR_DEPARTMENT_OTHER): Payer: Medicare Other

## 2012-06-12 ENCOUNTER — Encounter: Payer: Self-pay | Admitting: Internal Medicine

## 2012-06-12 ENCOUNTER — Ambulatory Visit (INDEPENDENT_AMBULATORY_CARE_PROVIDER_SITE_OTHER): Payer: Medicare Other | Admitting: Internal Medicine

## 2012-06-12 VITALS — BP 125/85 | HR 90 | Temp 98.2°F

## 2012-06-12 DIAGNOSIS — L089 Local infection of the skin and subcutaneous tissue, unspecified: Secondary | ICD-10-CM

## 2012-06-12 DIAGNOSIS — T148XXA Other injury of unspecified body region, initial encounter: Secondary | ICD-10-CM

## 2012-06-12 LAB — COMPREHENSIVE METABOLIC PANEL
AST: 13 U/L (ref 0–37)
Albumin: 3.8 g/dL (ref 3.5–5.2)
Alkaline Phosphatase: 82 U/L (ref 39–117)
Glucose, Bld: 115 mg/dL — ABNORMAL HIGH (ref 70–99)
Potassium: 4.3 mEq/L (ref 3.5–5.3)
Sodium: 139 mEq/L (ref 135–145)
Total Protein: 8 g/dL (ref 6.0–8.3)

## 2012-06-12 NOTE — Progress Notes (Signed)
[facility]  NAME:  [name] [name], [name] [name] NO.:  MEDICAL RECORD NO.:  [id number]      DATE OF BIRTH:  01/20/1966  PHYSICIAN:  Wayland Denis, DO       VISIT DATE:  06/11/2012                                  OFFICE VISIT   The patient is a 47 year old gentleman who is here with his caregiver for followup on his multiple sacral ulcers and left knee ulcer.  The knee is looking really good.  It seems to be granulating, has good color and no sign of infection.  The sacral ulcers have improved a little bit. I am sure that being in the hospital and off the area being turned helps.  His medications are unchanged.  Social historyis unchanged.  On exam, he is alert, oriented, and very pleasant as usual.  His breathing is unlabored.  His heart is regular.  The ulcers are described above. At this point, we can use silver collagen on his knee changed weekly and wet-to-dry dressings on the sacral area and continue to work on protein, multivitamin, vitamin C, zinc and offloading, and we will see him back in 3 months.     Wayland Denis, DO     CS/MEDQ  D:  06/11/2012  T:  06/12/2012  Job:  [id number]

## 2012-06-12 NOTE — Progress Notes (Signed)
Patient ID: [name] [name], male   DOB: 1965/05/26, 47 y.o.   MRN: [id number]    [facility]  Patient Active Problem List  Diagnosis  . PVD  . Recurrent pneumonia  . OSA (obstructive sleep apnea)  . Obesity  . Quadriplegia  . Hyponatremia  . Leukocytosis  . Seizure disorder  . HTN (hypertension)  . Severe sepsis  . Sacral decubitus ulcer, stage IV  . Anemia  . S/P colostomy  . Suprapubic catheter  . Recurrent UTI  . Wound infection    Patient's Medications  New Prescriptions   No medications on file  Previous Medications   ALBUTEROL (PROVENTIL) (2.5 MG/3ML) 0.083% NEBULIZER SOLUTION    Take 2.5 mg by nebulization every 6 (six) hours as needed. Shortness of breath   BACLOFEN (LIORESAL) 20 MG TABLET    Take 20 mg by mouth 4 (four) times daily.    DEXTROSE 5 % SOLN 50 ML WITH CEFTAZIDIME 2 G SOLR 2 G    Inject 2 g into the vein every 8 (eight) hours.   DOCUSATE SODIUM (COLACE) 100 MG CAPSULE    Take 100 mg by mouth 2 (two) times daily.   FAMOTIDINE (PEPCID) 20 MG TABLET    Take 20 mg by mouth 2 (two) times daily.     FERROUS SULFATE 325 (65 FE) MG TABLET    Take 1 tablet (325 mg total) by mouth 3 (three) times daily with meals.   FUROSEMIDE (LASIX) 20 MG TABLET    Take 20 mg by mouth daily.   METOCLOPRAMIDE (REGLAN) 10 MG TABLET    Take 1 tablet (10 mg total) by mouth 3 (three) times daily before meals.   MULTIPLE VITAMIN (MULTIVITAMIN WITH MINERALS) TABS    Take 1 tablet by mouth daily.   POTASSIUM CHLORIDE SA (K-DUR,KLOR-CON) 20 MEQ TABLET    Take 20 mEq by mouth daily.   SODIUM CHLORIDE 0.9 % SOLN 500 ML WITH VANCOMYCIN 10 G SOLR 1,500 MG    Inject 1,500 mg into the vein every 12 (twelve) hours.   VITAMIN C (ASCORBIC ACID) 500 MG TABLET    Take 500 mg by mouth 2 (two) times daily.    ZINC GLUCONATE 50 MG TABLET    Take 50 mg by mouth 2 (two) times daily. For wound healing.  Modified Medications   No medications on file  Discontinued Medications   No  medications on file    Subjective: [name] [name] is in for his hospital followup visit. He has been followed by my partner, Dr. Ninetta Lights, in the past for infected sacral decubiti. He completed a six-week course of IV vancomycin and meropenem in October of last year. I believe he was placed on chronic doxycycline after that. He was readmitted to the hospital on December 20 with sepsis and recurrent sacral wound infection. He underwent debridement of the wound on December 30 by Dr. Meliton Rattan. She noted that there was exposed bone and tendon. Swab culture the wound had grown MRSA. Tissue cultures obtained at the time of surgery grew Pseudomonas and enterococcus. He was discharged on IV vancomycin and ceftazadime. He is now completed 4 weeks of postoperative antibiotic therapy. He had an emergency department recently to have the PICC declotted but otherwise he has tolerated the PICC in his antibiotics well. He is feeling better. He has not had any fever or since shortly after going home. His appetite is improved and he feels like he is regaining some of the weight  he lost in the hospital. He was seen by Dr. Kelly Splinter yesterday. Her brief note states " they sacral ulcers have improved a little bit." He lives with a caregiver who changes his dressing daily.  Objective: Temp: 98.2 F (36.8 C) (01/28 1616) Temp src: Oral (01/28 1616) BP: 125/85 mmHg (01/28 1616) Pulse Rate: 90  (01/28 1616)  General: He is pleasant and in no distress. He is seated in his wheelchair. Skin: Right arm PICC site appears normal Sacral wounds are not examined   Assessment: Appears that he is improving following his most recent hospitalization, debridement and one month of postoperative antibiotics. I will check lab work today and plan on continuing the antibiotics for 2 more weeks.  Plan: 1. Continue IV antibiotics for 2 more weeks 2. Check lab work today 3. Return to clinic in 2 weeks   Cliffton Asters, MD [facility] [phone] pager   [phone] cell 06/12/2012, 4:42 PM

## 2012-06-13 LAB — SEDIMENTATION RATE: Sed Rate: 138 mm/hr — ABNORMAL HIGH (ref 0–16)

## 2012-06-25 ENCOUNTER — Telehealth: Payer: Self-pay | Admitting: *Deleted

## 2012-06-25 ENCOUNTER — Encounter: Payer: Self-pay | Admitting: Infectious Diseases

## 2012-06-25 ENCOUNTER — Ambulatory Visit (INDEPENDENT_AMBULATORY_CARE_PROVIDER_SITE_OTHER): Payer: Medicare Other | Admitting: Infectious Diseases

## 2012-06-25 VITALS — BP 108/76 | HR 102 | Temp 98.0°F

## 2012-06-25 DIAGNOSIS — L89109 Pressure ulcer of unspecified part of back, unspecified stage: Secondary | ICD-10-CM

## 2012-06-25 DIAGNOSIS — L89154 Pressure ulcer of sacral region, stage 4: Secondary | ICD-10-CM

## 2012-06-25 DIAGNOSIS — L8994 Pressure ulcer of unspecified site, stage 4: Secondary | ICD-10-CM

## 2012-06-25 NOTE — Addendum Note (Signed)
Addended by: Jennet Maduro D on: 06/25/2012 12:59 PM   Modules accepted: Orders

## 2012-06-25 NOTE — Progress Notes (Addendum)
RN received verbal order to discontinue the patient's PICC line.  Patient identified with name and date of birth. PICC dressing removed, site unremarkable.  Suture removed without difficulty.  PICC line removed using sterile procedure @ 1259. PICC length equal to that noted in patient's hospital chart of 52 cm. Sterile petroleum gauze + sterile 4X4 applied to PICC site, pressure applied for 10 minutes and covered with Medipore tape as a pressure dressing. Patient tolerated procedure without complaints.  Patient instructed to limit use of arm for 1 hour. Patient instructed that the pressure dressing should remain in place for 24 hours. Patient verbalized understanding of these instructions.
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
## 2012-06-25 NOTE — Telephone Encounter (Signed)
This needs to be done by Dr Kelly Splinter, his wound MD

## 2012-06-25 NOTE — Assessment & Plan Note (Signed)
I am unable to examine his wound as he is quite large and in a wheel chair. Will count his anbx as complete (for now), there are no good oral options for his pseudomonas and treating his enterococcus alone may drive his pseudo to become more resistant.  Will pull his PIC today.  He will f/u with Dr Kelly Splinter regarding his continued wound needs. Given the chronicity of his wounds, I am not sure that re-imaging him at this point would be helpful.  Will see him back in April as already scheduled, unless he has further difficulty in the intervening period.

## 2012-06-25 NOTE — Telephone Encounter (Signed)
Amy, [facility] RN, concerned about wound not improving.  Believes that pt needs more intensive care than can be given at home.  Wound management needing minimum of BID dressing changes which the family and is not able to provide.  Requesting advice from MD.

## 2012-06-29 ENCOUNTER — Telehealth: Payer: Self-pay | Admitting: Physician Assistant
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
## 2012-07-10 ENCOUNTER — Encounter (HOSPITAL_COMMUNITY): Payer: Self-pay | Admitting: Emergency Medicine

## 2012-07-10 ENCOUNTER — Inpatient Hospital Stay (HOSPITAL_COMMUNITY)
Admission: EM | Admit: 2012-07-10 | Discharge: 2012-07-13 | DRG: 689 | Disposition: A | Discharge status: Home / Self Care | Payer: Medicare Other | Attending: Internal Medicine | Admitting: Internal Medicine

## 2012-07-10 DIAGNOSIS — B961 Klebsiella pneumoniae [K. pneumoniae] as the cause of diseases classified elsewhere: Secondary | ICD-10-CM | POA: Diagnosis present

## 2012-07-10 DIAGNOSIS — L8994 Pressure ulcer of unspecified site, stage 4: Secondary | ICD-10-CM | POA: Diagnosis present

## 2012-07-10 DIAGNOSIS — IMO0002 Reserved for concepts with insufficient information to code with codable children: Secondary | ICD-10-CM

## 2012-07-10 DIAGNOSIS — B9689 Other specified bacterial agents as the cause of diseases classified elsewhere: Secondary | ICD-10-CM | POA: Diagnosis present

## 2012-07-10 DIAGNOSIS — L89109 Pressure ulcer of unspecified part of back, unspecified stage: Secondary | ICD-10-CM | POA: Diagnosis present

## 2012-07-10 DIAGNOSIS — Z79899 Other long term (current) drug therapy: Secondary | ICD-10-CM

## 2012-07-10 DIAGNOSIS — G40909 Epilepsy, unspecified, not intractable, without status epilepticus: Secondary | ICD-10-CM | POA: Diagnosis present

## 2012-07-10 DIAGNOSIS — I10 Essential (primary) hypertension: Secondary | ICD-10-CM

## 2012-07-10 DIAGNOSIS — D72829 Elevated white blood cell count, unspecified: Secondary | ICD-10-CM

## 2012-07-10 DIAGNOSIS — J45909 Unspecified asthma, uncomplicated: Secondary | ICD-10-CM | POA: Diagnosis present

## 2012-07-10 DIAGNOSIS — G4733 Obstructive sleep apnea (adult) (pediatric): Secondary | ICD-10-CM | POA: Diagnosis present

## 2012-07-10 DIAGNOSIS — J9610 Chronic respiratory failure, unspecified whether with hypoxia or hypercapnia: Secondary | ICD-10-CM | POA: Diagnosis present

## 2012-07-10 DIAGNOSIS — L89154 Pressure ulcer of sacral region, stage 4: Secondary | ICD-10-CM

## 2012-07-10 DIAGNOSIS — D509 Iron deficiency anemia, unspecified: Secondary | ICD-10-CM | POA: Diagnosis present

## 2012-07-10 DIAGNOSIS — N12 Tubulo-interstitial nephritis, not specified as acute or chronic: Secondary | ICD-10-CM

## 2012-07-10 DIAGNOSIS — N390 Urinary tract infection, site not specified: Principal | ICD-10-CM

## 2012-07-10 DIAGNOSIS — Z6832 Body mass index (BMI) 32.0-32.9, adult: Secondary | ICD-10-CM

## 2012-07-10 DIAGNOSIS — G8250 Quadriplegia, unspecified: Secondary | ICD-10-CM

## 2012-07-10 DIAGNOSIS — E662 Morbid (severe) obesity with alveolar hypoventilation: Secondary | ICD-10-CM | POA: Diagnosis present

## 2012-07-10 LAB — BASIC METABOLIC PANEL
BUN: 18 mg/dL (ref 6–23)
Chloride: 107 mEq/L (ref 96–112)
Creatinine, Ser: 0.46 mg/dL — ABNORMAL LOW (ref 0.50–1.35)
GFR calc Af Amer: >90 mL/min (ref >90)
Glucose, Bld: 124 mg/dL — ABNORMAL HIGH (ref 70–99)

## 2012-07-10 LAB — CBC WITH DIFFERENTIAL/PLATELET
Basophils Relative: 0 % (ref 0–1)
Eosinophils Absolute: 0.4 10*3/uL (ref 0.0–0.7)
HCT: 23.9 % — ABNORMAL LOW (ref 39.0–52.0)
Hemoglobin: 7.3 g/dL — ABNORMAL LOW (ref 13.0–17.0)
MCH: 23.1 pg — ABNORMAL LOW (ref 26.0–34.0)
MCHC: 30.5 g/dL (ref 30.0–36.0)
Monocytes Absolute: 2 10*3/uL — ABNORMAL HIGH (ref 0.1–1.0)
Monocytes Relative: 15 % — ABNORMAL HIGH (ref 3–12)
Neutro Abs: 9.8 10*3/uL — ABNORMAL HIGH (ref 1.7–7.7)

## 2012-07-10 LAB — URINE MICROSCOPIC-ADD ON

## 2012-07-10 LAB — URINALYSIS, ROUTINE W REFLEX MICROSCOPIC
Bilirubin Urine: NEGATIVE
Glucose, UA: NEGATIVE mg/dL
Ketones, ur: NEGATIVE mg/dL
pH: 6 (ref 5.0–8.0)

## 2012-07-10 MED ORDER — ACETAMINOPHEN 650 MG RE SUPP: 650.0000 mg | Freq: Four times a day (QID) | RECTAL | Status: DC | PRN

## 2012-07-10 MED ORDER — SODIUM CHLORIDE 0.9 % IJ SOLN: 3.0000 mL | Freq: Two times a day (BID) | INTRAMUSCULAR | Status: DC

## 2012-07-10 MED ORDER — ADULT MULTIVITAMIN W/MINERALS CH
1.0000 | ORAL_TABLET | Freq: Every day | ORAL | Status: DC
  Administered 2012-07-11 – 2012-07-13 (×3): 1 via ORAL
  Filled 2012-07-10 (×3): qty 1

## 2012-07-10 MED ORDER — SODIUM CHLORIDE 0.9 % IJ SOLN: 3.0000 mL | INTRAMUSCULAR | Status: DC | PRN

## 2012-07-10 MED ORDER — DOCUSATE SODIUM 100 MG PO CAPS
100.0000 mg | ORAL_CAPSULE | Freq: Two times a day (BID) | ORAL | Status: DC
  Administered 2012-07-10 – 2012-07-13 (×6): 100 mg via ORAL
  Filled 2012-07-10 (×7): qty 1

## 2012-07-10 MED ORDER — ALBUTEROL SULFATE (5 MG/ML) 0.5% IN NEBU
2.5000 mg | INHALATION_SOLUTION | Freq: Four times a day (QID) | RESPIRATORY_TRACT | Status: DC | PRN
  Administered 2012-07-11 – 2012-07-12 (×3): 2.5 mg via RESPIRATORY_TRACT
  Filled 2012-07-10 (×3): qty 0.5

## 2012-07-10 MED ORDER — SODIUM CHLORIDE 0.9 % IV SOLN
INTRAVENOUS | Status: AC
  Administered 2012-07-10: 23:00:00 via INTRAVENOUS

## 2012-07-10 MED ORDER — FAMOTIDINE 20 MG PO TABS
20.0000 mg | ORAL_TABLET | Freq: Two times a day (BID) | ORAL | Status: DC
  Administered 2012-07-10 – 2012-07-13 (×6): 20 mg via ORAL
  Filled 2012-07-10 (×7): qty 1

## 2012-07-10 MED ORDER — ONDANSETRON HCL 4 MG/2ML IJ SOLN: 4.0000 mg | Freq: Four times a day (QID) | INTRAMUSCULAR | Status: DC | PRN

## 2012-07-10 MED ORDER — FUROSEMIDE 20 MG PO TABS
20.0000 mg | ORAL_TABLET | Freq: Every day | ORAL | Status: DC
  Administered 2012-07-11 – 2012-07-13 (×3): 20 mg via ORAL
  Filled 2012-07-10 (×3): qty 1

## 2012-07-10 MED ORDER — ONDANSETRON HCL 4 MG PO TABS: 4.0000 mg | ORAL_TABLET | Freq: Four times a day (QID) | ORAL | Status: DC | PRN

## 2012-07-10 MED ORDER — DEXTROSE 5 % IV SOLN
1.0000 g | Freq: Two times a day (BID) | INTRAVENOUS | Status: DC
  Administered 2012-07-10 – 2012-07-13 (×6): 1 g via INTRAVENOUS
  Filled 2012-07-10 (×8): qty 1

## 2012-07-10 MED ORDER — VITAMIN C 500 MG PO TABS
500.0000 mg | ORAL_TABLET | Freq: Two times a day (BID) | ORAL | Status: DC
  Administered 2012-07-10 – 2012-07-13 (×6): 500 mg via ORAL
  Filled 2012-07-10 (×7): qty 1

## 2012-07-10 MED ORDER — METOCLOPRAMIDE HCL 10 MG PO TABS
10.0000 mg | ORAL_TABLET | Freq: Three times a day (TID) | ORAL | Status: DC
  Administered 2012-07-11 – 2012-07-13 (×8): 10 mg via ORAL
  Filled 2012-07-10 (×11): qty 1

## 2012-07-10 MED ORDER — SODIUM CHLORIDE 0.9 % IV BOLUS (SEPSIS)
1000.0000 mL | Freq: Once | INTRAVENOUS | Status: AC
  Administered 2012-07-10: 1000 mL via INTRAVENOUS

## 2012-07-10 MED ORDER — SODIUM CHLORIDE 0.9 % IV SOLN: 250.0000 mL | INTRAVENOUS | Status: DC | PRN

## 2012-07-10 MED ORDER — BACLOFEN 20 MG PO TABS
20.0000 mg | ORAL_TABLET | Freq: Four times a day (QID) | ORAL | Status: DC
  Administered 2012-07-10 – 2012-07-13 (×11): 20 mg via ORAL
  Filled 2012-07-10 (×13): qty 1

## 2012-07-10 MED ORDER — FERROUS SULFATE 325 (65 FE) MG PO TABS
325.0000 mg | ORAL_TABLET | Freq: Three times a day (TID) | ORAL | Status: DC
  Administered 2012-07-11 – 2012-07-13 (×8): 325 mg via ORAL
  Filled 2012-07-10 (×11): qty 1

## 2012-07-10 MED ORDER — ONDANSETRON HCL 4 MG/2ML IJ SOLN: 4.0000 mg | Freq: Three times a day (TID) | INTRAMUSCULAR | Status: AC | PRN

## 2012-07-10 MED ORDER — ACETAMINOPHEN 325 MG PO TABS
650.0000 mg | ORAL_TABLET | Freq: Four times a day (QID) | ORAL | Status: DC | PRN
  Administered 2012-07-11 – 2012-07-13 (×5): 650 mg via ORAL
  Filled 2012-07-10 (×5): qty 2

## 2012-07-10 MED ORDER — POTASSIUM CHLORIDE CRYS ER 20 MEQ PO TBCR
20.0000 meq | EXTENDED_RELEASE_TABLET | Freq: Every day | ORAL | Status: DC
  Administered 2012-07-11 – 2012-07-13 (×3): 20 meq via ORAL
  Filled 2012-07-10 (×3): qty 1

## 2012-07-10 MED ORDER — ZINC SULFATE 220 (50 ZN) MG PO CAPS
220.0000 mg | ORAL_CAPSULE | Freq: Every day | ORAL | Status: DC
  Administered 2012-07-11 – 2012-07-13 (×3): 220 mg via ORAL
  Filled 2012-07-10 (×3): qty 1

## 2012-07-10 MED ORDER — SODIUM CHLORIDE 0.9 % IV SOLN
Freq: Once | INTRAVENOUS | Status: AC
  Administered 2012-07-10: 18:00:00 via INTRAVENOUS
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
## 2012-07-10 NOTE — ED Notes (Signed)
Pt states he has felt tired and weak since Friday.  Foley bag had 60ml after 1 hour.
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
## 2012-07-10 NOTE — ED Notes (Signed)
Unsuccessfully attempted to obtain blood for labs x2.RN made aware 
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
## 2012-07-10 NOTE — ED Notes (Signed)
Pt from home.  Pt quadraplegic.  Pt states foley bag not filling up.  Feels like he has UTI.  Hx of recurrent UTIs.

## 2012-07-10 NOTE — ED Notes (Signed)
ZOX:[id number]<AV> Expected date:<BR> Expected time:<BR> Means of arrival:<BR> Comments:<BR> UTI

## 2012-07-10 NOTE — ED Notes (Signed)
Verbal order Nanavati to start ABX after one set cultures if unable to get 2nd set.

## 2012-07-11 DIAGNOSIS — G4733 Obstructive sleep apnea (adult) (pediatric): Secondary | ICD-10-CM | POA: Diagnosis present

## 2012-07-11 LAB — IRON AND TIBC
Iron: <10 ug/dL — ABNORMAL LOW (ref 42–135)
UIBC: 177 ug/dL (ref 125–400)

## 2012-07-11 LAB — CBC
HCT: 23.9 % — ABNORMAL LOW (ref 39.0–52.0)
MCH: 23.2 pg — ABNORMAL LOW (ref 26.0–34.0)
MCHC: 30.5 g/dL (ref 30.0–36.0)
MCV: 75.9 fL — ABNORMAL LOW (ref 78.0–100.0)
Platelets: 420 10*3/uL — ABNORMAL HIGH (ref 150–400)
RDW: 18.8 % — ABNORMAL HIGH (ref 11.5–15.5)

## 2012-07-11 LAB — BASIC METABOLIC PANEL
BUN: 16 mg/dL (ref 6–23)
CO2: 20 mEq/L (ref 19–32)
Calcium: 9.1 mg/dL (ref 8.4–10.5)
Creatinine, Ser: 0.47 mg/dL — ABNORMAL LOW (ref 0.50–1.35)
Glucose, Bld: 130 mg/dL — ABNORMAL HIGH (ref 70–99)

## 2012-07-11 LAB — FERRITIN: Ferritin: 506 ng/mL — ABNORMAL HIGH (ref 22–322)

## 2012-07-11 LAB — MRSA PCR SCREENING: MRSA by PCR: NEGATIVE

## 2012-07-11 LAB — RETICULOCYTES: Retic Count, Absolute: 70.8 10*3/uL (ref 19.0–186.0)

## 2012-07-11 NOTE — Consult Note (Signed)
WOC consult Note pt well known to WOC from previous admissions. Pt with large right ischial Stage IV pressure ulcer last admission tx per Dr. Kelly Splinter from plastics which included debridement with placement of Acell, is followed currently by plastics for these wounds. Smaller sacral Stage III pressure ulcer and left ischial Stage IV pressure ulcers all POA and present over 2 years per pts recall. At home with dressing changes per [facility]) and private caregivers. Has air mattress overlay at home. Pt with diverting ostomy. Pt with several areas that appear scarred from healed pressure ulcers and with dog ear off the right hip from previous muscle flap and flap scarring over the entire buttocks region. Reason for Consult: evaluation of pressure ulcers Wound type: (2) Stage IV pressure ulcers right and left ischial; (1) Stage III pressure ulcer sacral Pressure Ulcer POA: Yes x 3 Measurement: Left ischial: 5cm x 3cm x 1cm  Right ischial: 13cm x 8.5cm x 4cm (pocket at 11 oclock-to visible bone) Sacral: 5.5cm x 1.5cm x 0.5 Wound bed: All wounds are pink, moist with some granulation noted in the right ischial and sacrum, however the left ischial area is more dark and ruddy concerning for biofilm.  Wound edges of the right ischium are "shaggy" from sheer and recent debridements. .   Drainage (amount, consistency, odor) heavy, serous, no significant odor Periwound: scarring, but intact, not clinical picture of infection Dressing procedure/placement/frequency:normal saline moist gauze packed into the right ischial wound pocket area and over the open wound. Silver hydrofiber for the sacrum and left ischium.  All to be changed daily. Add air mattress for pressure redistribution.  Re consult if needed, will not follow at this time. Thanks  Keimari [place] [employer], CWOCN [phone])
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
## 2012-07-11 NOTE — Progress Notes (Signed)
RT set patient up on CPAP with large full face mask. Set to 19 cm H2O per home settings.Sterile water added to fill line. Patient states he is comfortable and will call if he requires any further assistance.
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
## 2012-07-11 NOTE — Care Management (Signed)
CARE MANAGEMENT NOTE 07/11/2012  Patient:  [name] [name],[name] [name]   Account Number:  [id number]  Date Initiated:  07/11/2012  Documentation initiated by:  Evea Sheek  Subjective/Objective Assessment:   47 yo male admitted with UTI. Pt is a quadraplegic. PTA home care provided by [facility] agency, private aide, and family     Action/Plan:   Home when stable   Anticipated DC Date:     Anticipated DC Plan:           Choice offered to / List presented to:             Status of service:  In process, will continue to follow Medicare Important Message given?   (If response is "NO", the following Medicare IM given date fields will be blank) Date Medicare IM given:   Date Additional Medicare IM given:    Discharge Disposition:    Per UR Regulation:  Reviewed for med. necessity/level of care/duration of stay  If discussed at Long Length of Stay Meetings, dates discussed:    Comments:  07/11/12 1224 Laqueshia Cihlar,RN,BSN [phone] Cm spoke with patient concerning discharge planning. Per pt [facility] provideds HH services. Pt states having private duty care and family to assist with home care. No other needs required at this time. Awaiting for resumption of care orders.

## 2012-07-11 NOTE — Progress Notes (Signed)
On call notified regarding AM temp. Medication given, Will continue to monitor.

## 2012-07-11 NOTE — Progress Notes (Signed)
On call made aware of pt. Temp. No new orders,  Tylenol given, will continue to monitor

## 2012-07-12 LAB — BASIC METABOLIC PANEL
CO2: 19 mEq/L (ref 19–32)
Calcium: 8.9 mg/dL (ref 8.4–10.5)
Chloride: 103 mEq/L (ref 96–112)
Creatinine, Ser: 0.61 mg/dL (ref 0.50–1.35)
Glucose, Bld: 115 mg/dL — ABNORMAL HIGH (ref 70–99)

## 2012-07-12 LAB — OCCULT BLOOD X 1 CARD TO LAB, STOOL: Fecal Occult Bld: NEGATIVE

## 2012-07-12 MED ORDER — DM-GUAIFENESIN ER 30-600 MG PO TB12
1.0000 | ORAL_TABLET | Freq: Two times a day (BID) | ORAL | Status: DC
  Administered 2012-07-12 – 2012-07-13 (×3): 1 via ORAL
  Filled 2012-07-12 (×4): qty 1

## 2012-07-12 NOTE — Progress Notes (Addendum)
Placed pt on cpap for rest, home settings of 19cm h2o, and full face mask as he wears this at home.  Pt is tolerating well at this time.  Sterile water added to max fill line of humidity chamber.  Pt stated that he is comfortable.  RT encouraged pt to call/let his nurse know should he need any further assistance.  RN aware.

## 2012-07-12 NOTE — Progress Notes (Signed)
[facility] PROGRESS NOTE  Andrei Mccook RUE:[id number] DOB: 04-19-1966 DOA: 07/10/2012 PCP: [name], MD  Brief narrative: [name] [name] is an 47 y.o. male with a past medical history of quadriplegia, decubitus ulcers, and chronic indwelling urinary catheter who came to the hospital on 07/10/2012 with chills and diaphoresis. Upon initial evaluation, the patient was found to have evidence of a urinary tract infection and was referred to the hospitalist service for further evaluation and treatment.  Assessment/Plan: Principal Problem:   Complicated UTI (urinary tract infection) -Admitted to the hospital and placed on empiric cefepime. -Chronic indwelling catheter replaced. -Urine culture sent. Narrow antibiotics once cultures data is back (reincubated). Active Problems:   OSA on CPAP -RT to provide CPAP Q HS.   Quadriplegia -Continue baclofen.   HTN (hypertension) -Continue Lasix.   Sacral decubitus ulcer, stage IV -S/P Wound care nurse evaluation. Continue normal saline wet-to-dry dressings to the right ischial wound pocket. Continue silver Hydrofiber for sacrum and left ischium. Change daily. -Air mattress overlay to reduce pressure. -Continue zinc and vitamin C supplements.   Microcytic Anemia -Anemia studies done recently with a ferritin of 413, folate greater than 20, iron less than 10, RBC folate 1230. Vitamin B 12 was 1978. -Followup Hemoccult stools. -Continue iron supplementation. -Hemoglobin stable. No current indication for transfusion.  Code Status: Full. Family Communication: None at bedside. Disposition Plan: Home when stable.   Medical Consultants:  None.  Other Consultants:  Wound care nurse  Anti-infectives:  Cefepime 07/10/2012--->  HPI/Subjective: [name] [name] does not quite feel back to baseline.  Still running fevers.  Suprapubic catheter replaced yesterday.  Appetite OK, but ostomy output less than usual.  No N/V.  Objective: Filed Vitals:    07/11/12 2116 07/11/12 2346 07/12/12 0434 07/12/12 0515  BP:   128/65   Pulse:   97   Temp:  99.8 F (37.7 C) 101.1 F (38.4 C) 99.9 F (37.7 C)  TempSrc:  Oral Oral Oral  Resp:   18   Height:      Weight:      SpO2: 98%  97%     Intake/Output Summary (Last 24 hours) at 07/12/12 0738 Last data filed at 07/12/12 0430  Gross per 24 hour  Intake   1200 ml  Output   2850 ml  Net  -1650 ml    Exam: Gen:  NAD Cardiovascular:  RRR, No M/R/G Respiratory:  Lungs CTAB Gastrointestinal:  Abdomen soft, NT/ND, + BS Extremities:  No C/E/C  Data Reviewed: Basic Metabolic Panel:  Recent Labs Lab 07/10/12 1749 07/11/12 0355 07/12/12 0340  NA 137 136 134*  K 3.7 3.6 4.2  CL 107 105 103  CO2 19 20 19   GLUCOSE 124* 130* 115*  BUN 18 16 17   CREATININE 0.46* 0.47* 0.61  CALCIUM 8.8 9.1 8.9   GFR Estimated Creatinine Clearance: 148.4 ml/min (by C-G formula based on Cr of 0.61).  CBC:  Recent Labs Lab 07/10/12 1749 07/11/12 0355  WBC 13.8* 12.6*  NEUTROABS 9.8*  --   HGB 7.3* 7.3*  HCT 23.9* 23.9*  MCV 75.6* 75.9*  PLT 441* 420*   Cardiac Enzymes:  Recent Labs Lab 07/10/12 1749  TROPONINI <0.30   Anemia work up  Recent Labs  07/11/12 0355  VITAMINB12 803  FOLATE >20.0  FERRITIN 506*  TIBC Not calculated due to Iron <10.  IRON <10*  RETICCTPCT 2.2   Microbiology Recent Results (from the past 240 hour(s))  URINE CULTURE     Status: None  Collection Time    07/10/12  3:21 PM      Result Value Range Status   Specimen Description URINE, CATHETERIZED   Final   Special Requests NONE   Final   Culture  Setup Time 07/10/2012 22:15   Final   Colony Count PENDING   Incomplete   Culture Culture reincubated for better growth   Final   Report Status PENDING   Incomplete  MRSA PCR SCREENING     Status: None   Collection Time    07/11/12 10:10 AM      Result Value Range Status   MRSA by PCR NEGATIVE  NEGATIVE Final   Comment:            The GeneXpert  MRSA Assay (FDA     approved for NASAL specimens     only), is one component of a     comprehensive MRSA colonization     surveillance program. It is not     intended to diagnose MRSA     infection nor to guide or     monitor treatment for     MRSA infections.     Procedures and Diagnostic Studies: No results found.  Scheduled Meds: . baclofen  20 mg Oral QID  . ceFEPime (MAXIPIME) IV  1 g Intravenous Q12H  . docusate sodium  100 mg Oral BID  . famotidine  20 mg Oral BID  . ferrous sulfate  325 mg Oral TID WC  . furosemide  20 mg Oral Daily  . metoCLOPramide  10 mg Oral TID AC  . multivitamin with minerals  1 tablet Oral Daily  . potassium chloride SA  20 mEq Oral Daily  . sodium chloride  3 mL Intravenous Q12H  . vitamin C  500 mg Oral BID  . zinc sulfate  220 mg Oral Daily   Continuous Infusions:   Time spent: 25 minutes.   LOS: 2 days   Keelyn Monjaras  [facility] Pager [phone].  If 8PM-8AM, please contact night-coverage at [email or web address], password [facility] 07/12/2012, 7:38 AM               '

## 2012-07-13 LAB — URINE CULTURE

## 2012-07-13 LAB — OCCULT BLOOD X 1 CARD TO LAB, STOOL: Fecal Occult Bld: NEGATIVE

## 2012-07-13 MED ORDER — CIPROFLOXACIN HCL 500 MG PO TABS
500.0000 mg | ORAL_TABLET | Freq: Two times a day (BID) | ORAL | Status: DC
  Filled 2012-07-13 (×2): qty 1

## 2012-07-13 MED ORDER — CIPROFLOXACIN HCL 500 MG PO TABS: 500.0000 mg | ORAL_TABLET | Freq: Two times a day (BID) | ORAL | Status: DC

## 2012-07-13 NOTE — Discharge Summary (Signed)
Physician Discharge Summary  Primus Gritton ZOX:[id number] DOB: 1965-10-12 DOA: 07/10/2012  PCP: [name], MD  Admit date: 07/10/2012 Discharge date: 07/13/2012  Recommendations for Outpatient Follow-up:  1. F/U with PCP in 1 week.  Discharge Diagnoses:  Principal Problem:    Complicated UTI (urinary tract infection) Active Problems:    Quadriplegia    Seizure disorder    HTN (hypertension)    Sacral decubitus ulcer, stage IV    Microcytic anemia    OSA on CPAP   Discharge Condition: Improved.  Diet recommendation: Low sodium, heart healthy.  History of present illness:  [name] [name] is an 47 y.o. male with a past medical history of quadriplegia, decubitus ulcers, and chronic indwelling urinary catheter who came to the hospital on 07/10/2012 with chills and diaphoresis. Upon initial evaluation, the patient was found to have evidence of a urinary tract infection and was referred to the hospitalist service for further evaluation and treatment.  Hospital Course by problem:  Principal Problem:  Complicated UTI (urinary tract infection)  -Admitted to the hospital and placed on empiric cefepime.  -Chronic indwelling catheter replaced.  -Urine culture sent. Grew enterobacter and Klebsiella, both Cipro sensitive.  Will D/C home on Cipro for an additional 5 days of therapy.  Active Problems:  OSA on CPAP  -RT provided CPAP Q HS.  Quadriplegia  -Continue baclofen.  HTN (hypertension)  -Continue Lasix.  Sacral decubitus ulcer, stage IV  -S/P Wound care nurse evaluation. Continue normal saline wet-to-dry dressings to the right ischial wound pocket. Continue silver Hydrofiber for sacrum and left ischium. Change daily.  -Air mattress overlay to reduce pressure.  -Continue zinc and vitamin C supplements.  -F/U with Dr. Kelly Splinter (plastics) for ongoing treatment recommendations. Microcytic Anemia  -Anemia studies done recently with a ferritin of 413, folate greater than 20, iron  less than 10, RBC folate 1230. Vitamin B 12 was 1978.  -Followup Hemoccult stools.  -Continue iron supplementation.  -Hemoglobin stable. No current indication for transfusion.   Procedures:  Suprapubic catheter exchanged 07/11/12  Consultations:  None.  Discharge Exam: Filed Vitals:   07/13/12 0619  BP: 110/50  Pulse: 98  Temp: 101 F (38.3 C)  Resp: 18   Filed Vitals:   07/12/12 2129 07/12/12 2316 07/13/12 0618 07/13/12 0619  BP: 128/82   110/50  Pulse: 86   98  Temp: 97.6 F (36.4 C)  98.6 F (37 C) 101 F (38.3 C)  TempSrc: Oral  Oral Oral  Resp: 18   18  Height:      Weight:      SpO2: 95% 95%  96%    Gen:  NAD Cardiovascular:  RRR, No M/R/G Respiratory: Lungs CTAB Gastrointestinal: Abdomen soft, NT/ND with normal active bowel sounds. Extremities: 2+ edema   Discharge Instructions  Discharge Orders   Future Appointments Provider Department Dept Phone   09/05/2012 2:30 PM Ginnie Smart, MD [facility] for Infectious Disease [phone]   Future Orders Complete By Expires     Call MD for:  persistant nausea and vomiting  As directed     Call MD for:  severe uncontrolled pain  As directed     Call MD for:  temperature >100.4  As directed     Diet - low sodium heart healthy  As directed     Increase activity slowly  As directed         Medication List    TAKE these medications  albuterol (2.5 MG/3ML) 0.083% nebulizer solution  Commonly known as:  PROVENTIL  Take 2.5 mg by nebulization every 6 (six) hours as needed. Shortness of breath     baclofen 20 MG tablet  Commonly known as:  LIORESAL  Take 20 mg by mouth 4 (four) times daily.     ciprofloxacin 500 MG tablet  Commonly known as:  CIPRO  Take 1 tablet (500 mg total) by mouth 2 (two) times daily.     docusate sodium 100 MG capsule  Commonly known as:  COLACE  Take 100 mg by mouth 2 (two) times daily.     famotidine 20 MG tablet  Commonly known as:  PEPCID  Take  20 mg by mouth 2 (two) times daily.     ferrous sulfate 325 (65 FE) MG tablet  Take 1 tablet (325 mg total) by mouth 3 (three) times daily with meals.     furosemide 20 MG tablet  Commonly known as:  LASIX  Take 20 mg by mouth daily.     metoCLOPramide 10 MG tablet  Commonly known as:  REGLAN  Take 1 tablet (10 mg total) by mouth 3 (three) times daily before meals.     multivitamin with minerals Tabs  Take 1 tablet by mouth daily.     potassium chloride SA 20 MEQ tablet  Commonly known as:  K-DUR,KLOR-CON  Take 20 mEq by mouth daily.     vitamin C 500 MG tablet  Commonly known as:  ASCORBIC ACID  Take 500 mg by mouth 2 (two) times daily.     zinc gluconate 50 MG tablet  Take 50 mg by mouth 2 (two) times daily. For wound healing.           Follow-up Information   Follow up with [name], MD. Schedule an appointment as soon as possible for a visit in 1 week. [facility] follow up.)    Contact information:   [address] [place] [address] [phone]        The results of significant diagnostics from this hospitalization (including imaging, microbiology, ancillary and laboratory) are listed below for reference.    Significant Diagnostic Studies: No results found.  Microbiology: Recent Results (from the past 240 hour(s))  URINE CULTURE     Status: None   Collection Time    07/10/12  3:21 PM      Result Value Range Status   Specimen Description URINE, CATHETERIZED   Final   Special Requests NONE   Final   Culture  Setup Time 07/10/2012 22:15   Final   Colony Count >=100,000 COLONIES/ML   Final   Culture     Final   Value: ENTEROBACTER CLOACAE     KLEBSIELLA PNEUMONIAE   Report Status 07/13/2012 FINAL   Final   Organism ID, Bacteria ENTEROBACTER CLOACAE   Final   Organism ID, Bacteria KLEBSIELLA PNEUMONIAE   Final  CULTURE, BLOOD (ROUTINE X 2)     Status: None   Collection Time    07/10/12  5:50 PM      Result Value Range Status    Specimen Description BLOOD RIGHT WRIST   Final   Special Requests BOTTLES DRAWN AEROBIC ONLY   Final   Culture  Setup Time 07/11/2012 02:22   Final   Culture     Final   Value:        BLOOD CULTURE RECEIVED NO GROWTH TO DATE CULTURE WILL BE HELD FOR 5 DAYS BEFORE ISSUING  A FINAL NEGATIVE REPORT   Report Status PENDING   Incomplete  MRSA PCR SCREENING     Status: None   Collection Time    07/11/12 10:10 AM      Result Value Range Status   MRSA by PCR NEGATIVE  NEGATIVE Final   Comment:            The GeneXpert MRSA Assay (FDA     approved for NASAL specimens     only), is one component of a     comprehensive MRSA colonization     surveillance program. It is not     intended to diagnose MRSA     infection nor to guide or     monitor treatment for     MRSA infections.     Labs:  Basic Metabolic Panel:  Recent Labs Lab 07/10/12 1749 07/11/12 0355 07/12/12 0340  NA 137 136 134*  K 3.7 3.6 4.2  CL 107 105 103  CO2 19 20 19   GLUCOSE 124* 130* 115*  BUN 18 16 17   CREATININE 0.46* 0.47* 0.61  CALCIUM 8.8 9.1 8.9   GFR Estimated Creatinine Clearance: 148.4 ml/min (by C-G formula based on Cr of 0.61).  CBC:  Recent Labs Lab 07/10/12 1749 07/11/12 0355  WBC 13.8* 12.6*  NEUTROABS 9.8*  --   HGB 7.3* 7.3*  HCT 23.9* 23.9*  MCV 75.6* 75.9*  PLT 441* 420*   Cardiac Enzymes:  Recent Labs Lab 07/10/12 1749  TROPONINI <0.30   Anemia work up  Recent Labs  07/11/12 0355  VITAMINB12 803  FOLATE >20.0  FERRITIN 506*  TIBC Not calculated due to Iron <10.  IRON <10*  RETICCTPCT 2.2     Time coordinating discharge: 35 minutes.  Signed:  RAMA,CHRISTINA  Pager [phone] [facility] 07/13/2012, 3:37 PM
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
## 2012-07-13 NOTE — Progress Notes (Signed)
Pt. Was discharged home. Discharge instructions were given to the pt and he was transported home by EMS
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
## 2012-07-13 NOTE — Progress Notes (Signed)
[facility]  Patient Status: Active (receiving services up to time of hospitalization)  [facility] is providing the following services: RN and Home Infusion Services (teaching and education will be done by nurse in the home with patient and caregiver)  If patient discharges after hours, please call [phone].   [name] [name] 07/13/2012, 9:46 AM

## 2012-07-17 LAB — CULTURE, BLOOD (ROUTINE X 2): Culture: NO GROWTH

## 2012-07-18 ENCOUNTER — Encounter: Payer: Self-pay | Admitting: Infectious Diseases

## 2012-07-21 ENCOUNTER — Emergency Department (HOSPITAL_COMMUNITY)
Admission: EM | Admit: 2012-07-21 | Discharge: 2012-07-22 | Disposition: A | Discharge status: Home / Self Care | Payer: Medicare Other | Attending: Emergency Medicine | Admitting: Emergency Medicine

## 2012-07-21 ENCOUNTER — Encounter (HOSPITAL_COMMUNITY): Payer: Self-pay | Admitting: Emergency Medicine

## 2012-07-21 ENCOUNTER — Emergency Department (HOSPITAL_COMMUNITY): Payer: Medicare Other

## 2012-07-21 DIAGNOSIS — G4733 Obstructive sleep apnea (adult) (pediatric): Secondary | ICD-10-CM | POA: Insufficient documentation

## 2012-07-21 DIAGNOSIS — Z8619 Personal history of other infectious and parasitic diseases: Secondary | ICD-10-CM | POA: Insufficient documentation

## 2012-07-21 DIAGNOSIS — R630 Anorexia: Secondary | ICD-10-CM | POA: Insufficient documentation

## 2012-07-21 DIAGNOSIS — G8254 Quadriplegia, C5-C7 incomplete: Secondary | ICD-10-CM | POA: Insufficient documentation

## 2012-07-21 DIAGNOSIS — Z8739 Personal history of other diseases of the musculoskeletal system and connective tissue: Secondary | ICD-10-CM | POA: Insufficient documentation

## 2012-07-21 DIAGNOSIS — Z8719 Personal history of other diseases of the digestive system: Secondary | ICD-10-CM | POA: Insufficient documentation

## 2012-07-21 DIAGNOSIS — J9610 Chronic respiratory failure, unspecified whether with hypoxia or hypercapnia: Secondary | ICD-10-CM | POA: Insufficient documentation

## 2012-07-21 DIAGNOSIS — Z8781 Personal history of (healed) traumatic fracture: Secondary | ICD-10-CM | POA: Insufficient documentation

## 2012-07-21 DIAGNOSIS — L8994 Pressure ulcer of unspecified site, stage 4: Secondary | ICD-10-CM | POA: Insufficient documentation

## 2012-07-21 DIAGNOSIS — Z79899 Other long term (current) drug therapy: Secondary | ICD-10-CM | POA: Insufficient documentation

## 2012-07-21 DIAGNOSIS — Z872 Personal history of diseases of the skin and subcutaneous tissue: Secondary | ICD-10-CM | POA: Insufficient documentation

## 2012-07-21 DIAGNOSIS — D649 Anemia, unspecified: Secondary | ICD-10-CM | POA: Insufficient documentation

## 2012-07-21 DIAGNOSIS — L97909 Non-pressure chronic ulcer of unspecified part of unspecified lower leg with unspecified severity: Secondary | ICD-10-CM | POA: Insufficient documentation

## 2012-07-21 DIAGNOSIS — Z8709 Personal history of other diseases of the respiratory system: Secondary | ICD-10-CM | POA: Insufficient documentation

## 2012-07-21 DIAGNOSIS — I10 Essential (primary) hypertension: Secondary | ICD-10-CM | POA: Insufficient documentation

## 2012-07-21 DIAGNOSIS — R61 Generalized hyperhidrosis: Secondary | ICD-10-CM | POA: Insufficient documentation

## 2012-07-21 DIAGNOSIS — J45909 Unspecified asthma, uncomplicated: Secondary | ICD-10-CM | POA: Insufficient documentation

## 2012-07-21 DIAGNOSIS — N390 Urinary tract infection, site not specified: Secondary | ICD-10-CM

## 2012-07-21 DIAGNOSIS — Z8669 Personal history of other diseases of the nervous system and sense organs: Secondary | ICD-10-CM | POA: Insufficient documentation

## 2012-07-21 LAB — URINALYSIS, ROUTINE W REFLEX MICROSCOPIC
Glucose, UA: NEGATIVE mg/dL
Protein, ur: 100 mg/dL — AB
pH: 5.5 (ref 5.0–8.0)

## 2012-07-21 LAB — URINE MICROSCOPIC-ADD ON

## 2012-07-21 MED ORDER — SODIUM CHLORIDE 0.9 % IV BOLUS (SEPSIS)
1000.0000 mL | Freq: Once | INTRAVENOUS | Status: AC
  Administered 2012-07-22: 1000 mL via INTRAVENOUS
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
## 2012-07-21 NOTE — ED Notes (Signed)
Could not get lab from patient.  IV team has been called.
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
## 2012-07-21 NOTE — ED Notes (Signed)
Unable to obtain IV access, Charge Nurse attempted, 2 other staff tried, failed. PA notified, MD notified. IV Team Nurse also attempted.
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
## 2012-07-22 LAB — CBC WITH DIFFERENTIAL/PLATELET
Basophils Relative: 0 % (ref 0–1)
Eosinophils Absolute: 0.3 10*3/uL (ref 0.0–0.7)
HCT: 24.6 % — ABNORMAL LOW (ref 39.0–52.0)
Hemoglobin: 7.4 g/dL — ABNORMAL LOW (ref 13.0–17.0)
MCH: 22.5 pg — ABNORMAL LOW (ref 26.0–34.0)
MCHC: 30.1 g/dL (ref 30.0–36.0)
Monocytes Absolute: 1.8 10*3/uL — ABNORMAL HIGH (ref 0.1–1.0)
Neutro Abs: 11.1 10*3/uL — ABNORMAL HIGH (ref 1.7–7.7)

## 2012-07-22 LAB — BASIC METABOLIC PANEL
BUN: 24 mg/dL — ABNORMAL HIGH (ref 6–23)
Calcium: 9.2 mg/dL (ref 8.4–10.5)
GFR calc non Af Amer: >90 mL/min (ref >90)
Glucose, Bld: 113 mg/dL — ABNORMAL HIGH (ref 70–99)

## 2012-07-22 LAB — LACTIC ACID, PLASMA: Lactic Acid, Venous: 1.2 mmol/L (ref 0.5–2.2)

## 2012-07-22 MED ORDER — DEXTROSE 5 % IV SOLN: 1.0000 g | Freq: Once | INTRAVENOUS | Status: DC

## 2012-07-22 MED ORDER — CIPROFLOXACIN IN D5W 400 MG/200ML IV SOLN
400.0000 mg | Freq: Once | INTRAVENOUS | Status: AC
  Administered 2012-07-22: 400 mg via INTRAVENOUS
  Filled 2012-07-22: qty 200

## 2012-07-22 MED ORDER — CIPROFLOXACIN HCL 500 MG PO TABS: 500.0000 mg | ORAL_TABLET | Freq: Two times a day (BID) | ORAL | Status: DC

## 2012-07-22 NOTE — ED Notes (Signed)
Only able to obtain one blood culture.
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
## 2012-07-22 NOTE — ED Notes (Signed)
ptar called for pt transportation home 

## 2012-07-23 ENCOUNTER — Emergency Department (HOSPITAL_COMMUNITY)
Admission: EM | Admit: 2012-07-23 | Discharge: 2012-07-23 | Disposition: A | Discharge status: Home / Self Care | Payer: Medicare Other | Attending: Emergency Medicine | Admitting: Emergency Medicine

## 2012-07-23 ENCOUNTER — Emergency Department (HOSPITAL_COMMUNITY): Payer: Medicare Other

## 2012-07-23 ENCOUNTER — Encounter (HOSPITAL_BASED_OUTPATIENT_CLINIC_OR_DEPARTMENT_OTHER): Payer: Medicare Other | Attending: Plastic Surgery

## 2012-07-23 DIAGNOSIS — L98499 Non-pressure chronic ulcer of skin of other sites with unspecified severity: Secondary | ICD-10-CM | POA: Insufficient documentation

## 2012-07-23 DIAGNOSIS — D649 Anemia, unspecified: Secondary | ICD-10-CM | POA: Insufficient documentation

## 2012-07-23 DIAGNOSIS — Z79899 Other long term (current) drug therapy: Secondary | ICD-10-CM | POA: Insufficient documentation

## 2012-07-23 DIAGNOSIS — J45909 Unspecified asthma, uncomplicated: Secondary | ICD-10-CM | POA: Insufficient documentation

## 2012-07-23 DIAGNOSIS — Z8619 Personal history of other infectious and parasitic diseases: Secondary | ICD-10-CM | POA: Insufficient documentation

## 2012-07-23 DIAGNOSIS — Y846 Urinary catheterization as the cause of abnormal reaction of the patient, or of later complication, without mention of misadventure at the time of the procedure: Secondary | ICD-10-CM | POA: Insufficient documentation

## 2012-07-23 DIAGNOSIS — I10 Essential (primary) hypertension: Secondary | ICD-10-CM | POA: Insufficient documentation

## 2012-07-23 DIAGNOSIS — Z87440 Personal history of urinary (tract) infections: Secondary | ICD-10-CM | POA: Insufficient documentation

## 2012-07-23 DIAGNOSIS — Z8719 Personal history of other diseases of the digestive system: Secondary | ICD-10-CM | POA: Insufficient documentation

## 2012-07-23 DIAGNOSIS — Z8709 Personal history of other diseases of the respiratory system: Secondary | ICD-10-CM | POA: Insufficient documentation

## 2012-07-23 DIAGNOSIS — Z8739 Personal history of other diseases of the musculoskeletal system and connective tissue: Secondary | ICD-10-CM | POA: Insufficient documentation

## 2012-07-23 DIAGNOSIS — G8250 Quadriplegia, unspecified: Secondary | ICD-10-CM | POA: Insufficient documentation

## 2012-07-23 DIAGNOSIS — Z872 Personal history of diseases of the skin and subcutaneous tissue: Secondary | ICD-10-CM | POA: Insufficient documentation

## 2012-07-23 DIAGNOSIS — Z8711 Personal history of peptic ulcer disease: Secondary | ICD-10-CM | POA: Insufficient documentation

## 2012-07-23 DIAGNOSIS — T83091A Other mechanical complication of indwelling urethral catheter, initial encounter: Secondary | ICD-10-CM | POA: Insufficient documentation

## 2012-07-23 DIAGNOSIS — Z8669 Personal history of other diseases of the nervous system and sense organs: Secondary | ICD-10-CM | POA: Insufficient documentation

## 2012-07-23 DIAGNOSIS — T83010A Breakdown (mechanical) of cystostomy catheter, initial encounter: Secondary | ICD-10-CM

## 2012-07-23 LAB — URINE CULTURE: Colony Count: >=100000

## 2012-07-23 MED ORDER — IOHEXOL 300 MG/ML  SOLN
50.0000 mL | Freq: Once | INTRAMUSCULAR | Status: AC | PRN
  Administered 2012-07-23: 20 mL

## 2012-07-23 NOTE — ED Notes (Signed)
Pt went to wound care today. States he was being changed by home health rn/family and they noticed his foley was removed.

## 2012-07-23 NOTE — ED Notes (Signed)
[id number]<GN> Expected date:<BR> Expected time:<BR> Means of arrival:<BR> Comments:<BR> ems-foley cath difficulty
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
## 2012-07-24 IMAGING — CR DG CHEST 1V PORT
1 series · 1 of 1 positions shown · non-contrast
Comparison: 08/12/2009

CLINICAL DATA: Pain

PORTABLE CHEST - 1 VIEW

[series [date]]
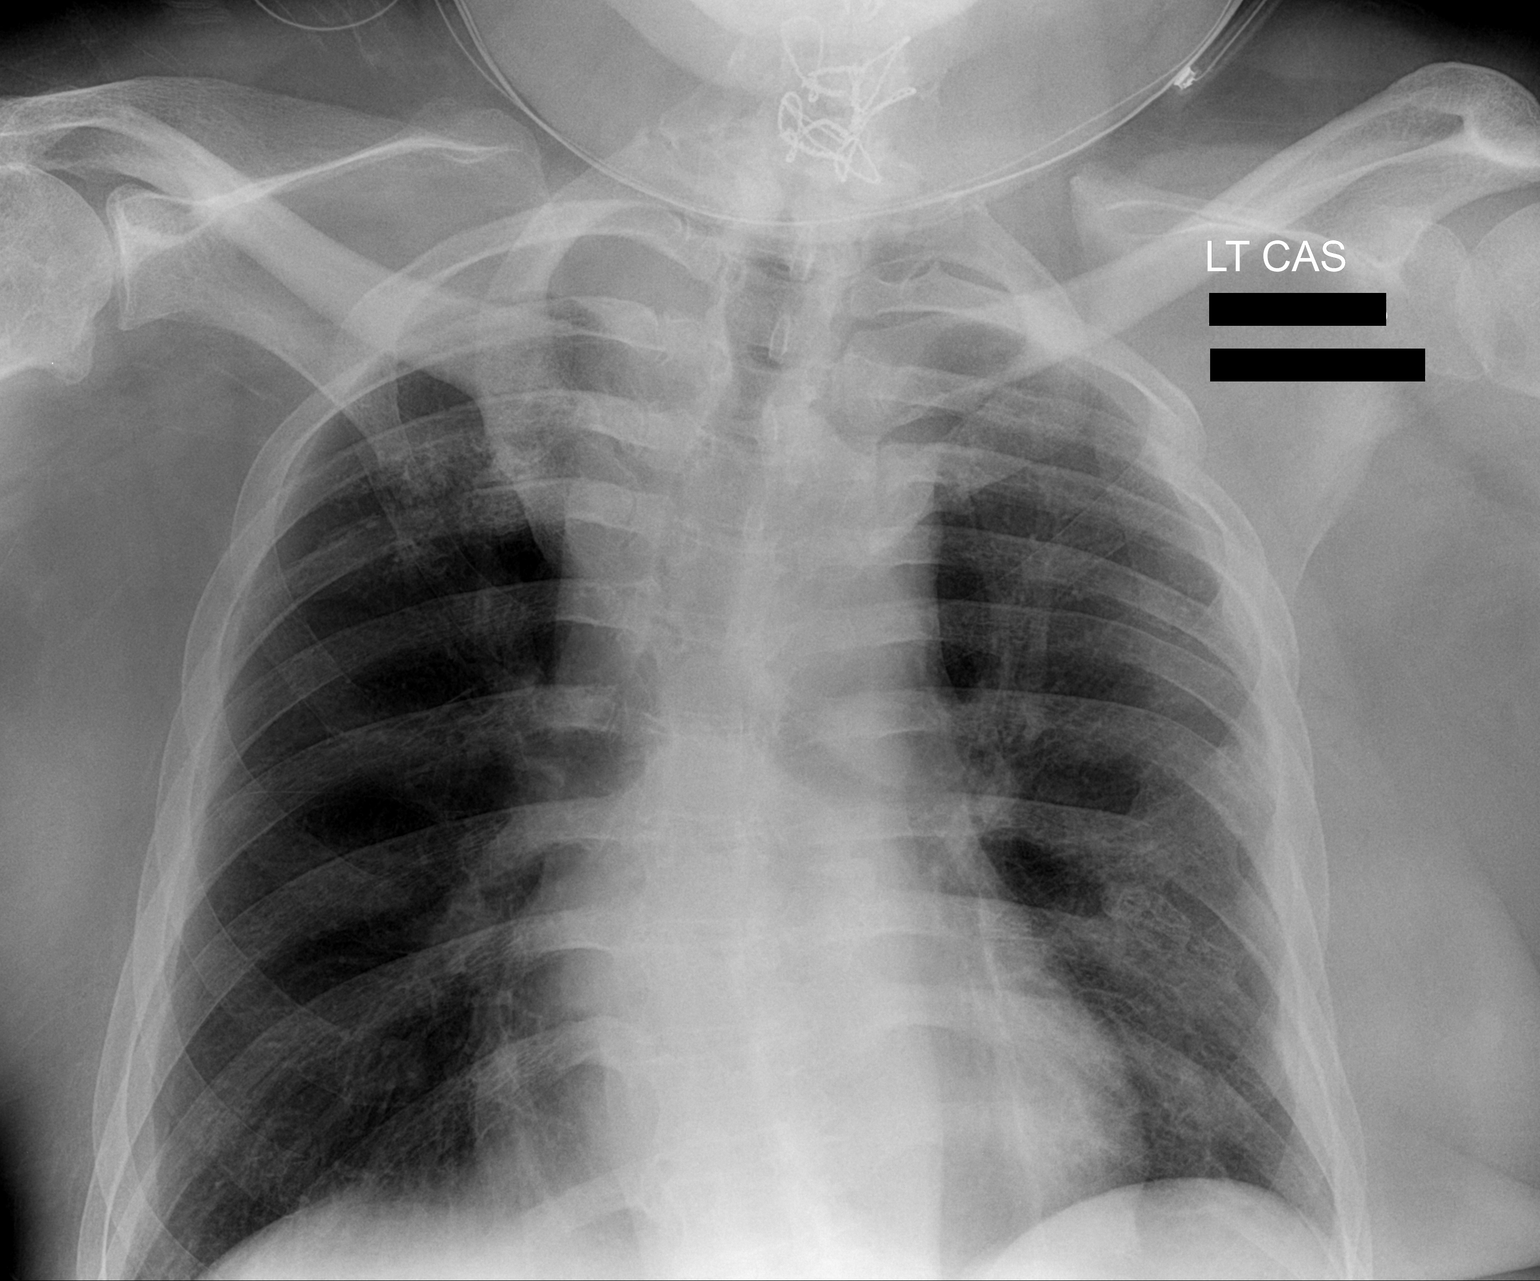

[1 of 1 positions shown; findings below may reference images not displayed]

FINDINGS: Lung bases are incompletely imaged.  Remote left-sided
rib fractures again noted.  Heart size is at the upper limits of
normal.  Pleural thickening, less likely effusions, unchanged since
the prior study.  No new focal pulmonary opacity. Cerclage wires
are noted over the neck.
IMPRESSION: No new acute finding.

## 2012-07-24 NOTE — Progress Notes (Signed)
[facility]  NAME:  [name] [name], [name] [name] NO.:  [id number]  MEDICAL RECORD NO.:  [id number]      DATE OF BIRTH:  1965/06/10  PHYSICIAN:  Wayland Denis, DO       VISIT DATE:  07/23/2012                                  OFFICE VISIT   The patient is a 47 year old gentleman who is here for followup on multiple sacral ulcers.  He was admitted recently and unfortunately made the areas worse from being in the hospital.  There has been no change in his medications or social history otherwise.  PHYSICAL EXAMINATION:  On exam, he is alert, oriented, and cooperative. He is pleasant like usual.  The wounds are not improved from last visit unfortunately but they do not appear to be infected.  He has a deepening of the right ischial wound.  At this point, he may need to go back to the operating room for more irrigation and debridement.  We will also check a pre-albumin, and see if we might be able to prepare him for the possibility of a muscle flap.     Wayland Denis, DO     CS/MEDQ  D:  07/23/2012  T:  07/24/2012  Job:  [id number]

## 2012-07-24 NOTE — ED Notes (Signed)
+   urine Chart sent to EDP office for review. 

## 2012-07-24 NOTE — ED Provider Notes (Signed)
Medical screening examination/treatment/procedure(s) were performed by non-physician practitioner and as supervising physician I was immediately available for consultation/collaboration.   Laray Anger, DO 07/24/12 1347

## 2012-07-25 IMAGING — CR DG SACRUM/COCCYX 2+V
7 series · 7 of 7 positions shown · non-contrast
Comparison: 08/06/2009

CLINICAL DATA: Decubitus ulcer on above.

SACRUM AND COCCYX - 2+ VIEW

[t sacrum a.p. *]
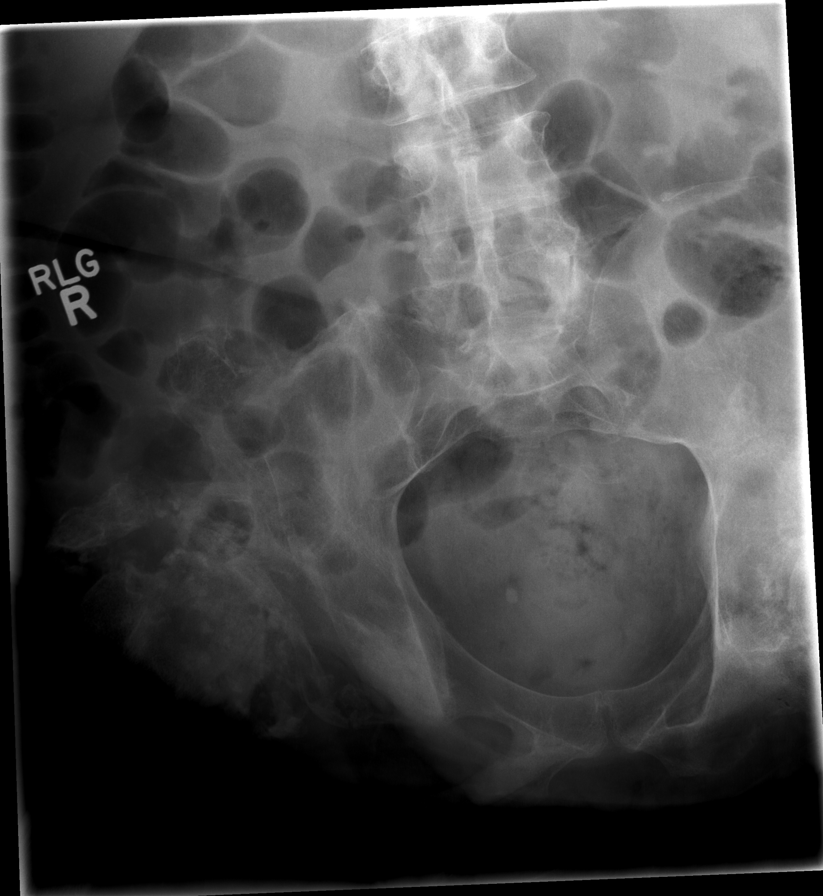

[t sacrum a.p.]
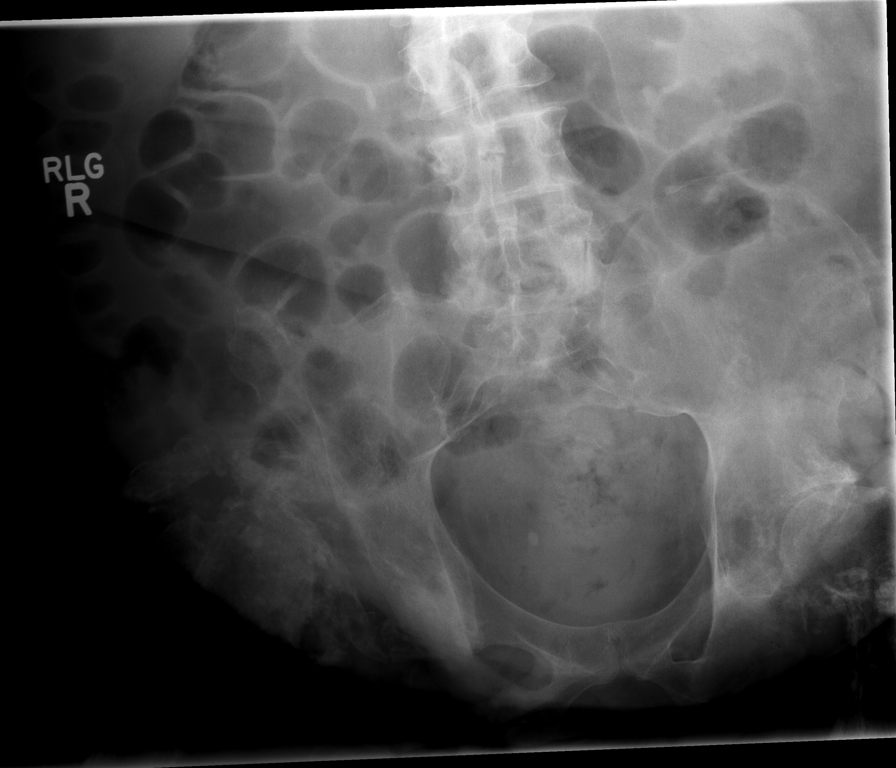

[t coccyx a.p. (1 of 2)]
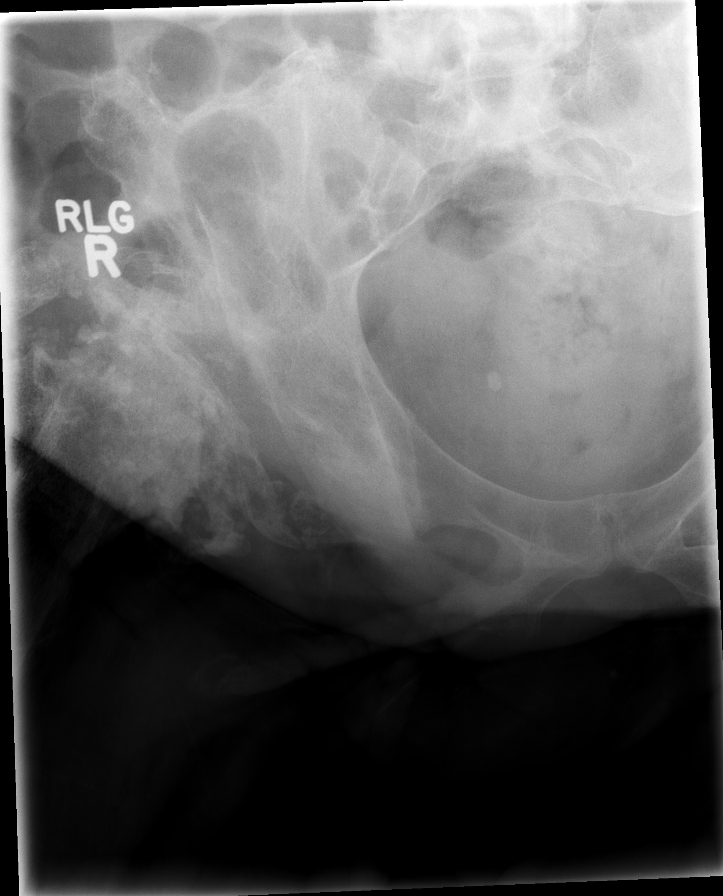

[t coccyx a.p. (2 of 2)]
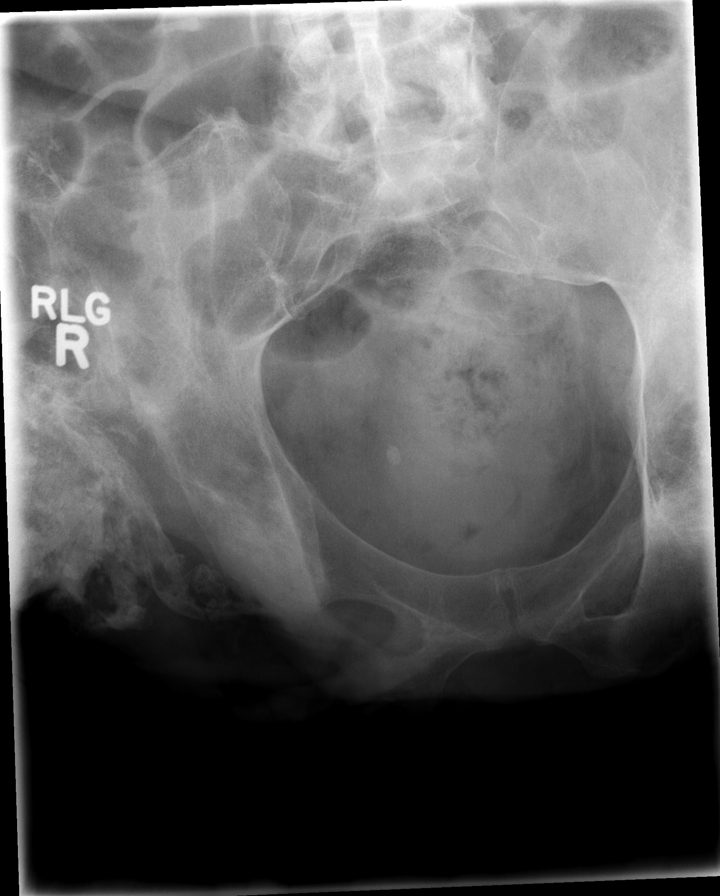

[t sacrum lat (1 of 2)]
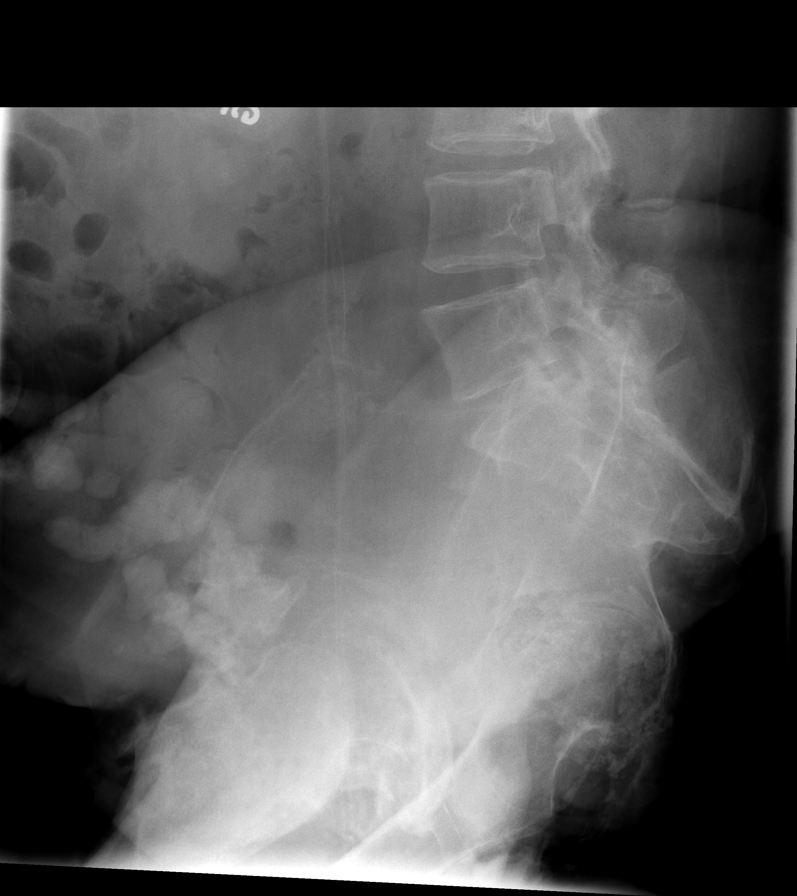

[t sacrum lat (2 of 2)]
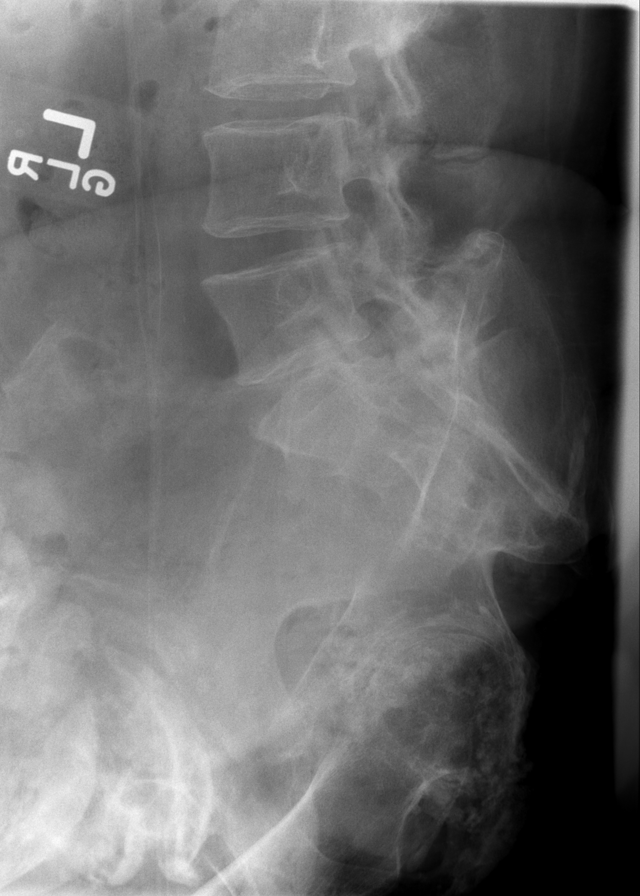

[t coccyx lat]
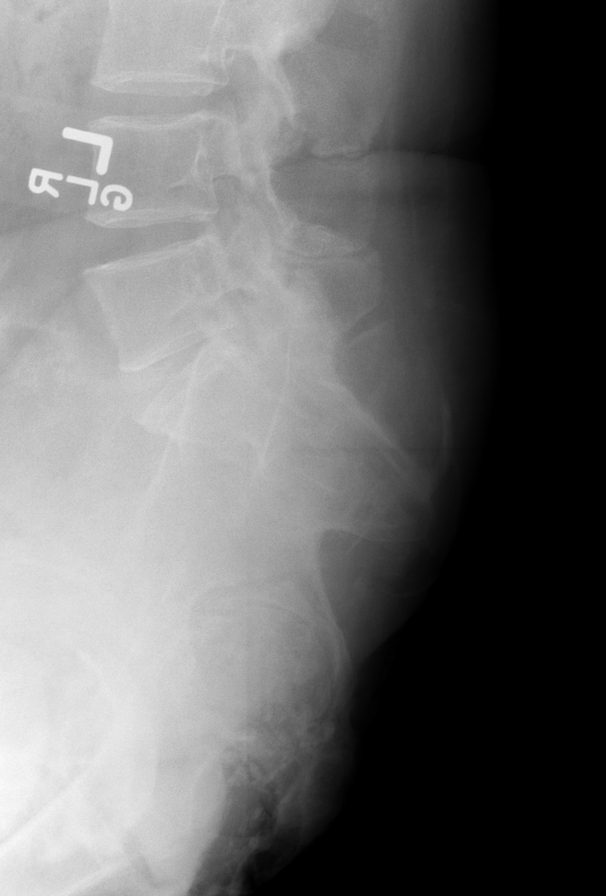

[7 of 7 positions shown; findings below may reference images not displayed]

FINDINGS: There is deformity of the hip joints/pelvis. It is
difficult to assess for soft tissue gas due to extensive bowel gas.
If there is clinical concern for abscess or soft tissue gas, CT
would be more sensitive.
IMPRESSION: Extensive chronic bony changes.  It is difficult to assess the soft
tissues for gas due to overlying bowel gas.  If there is high
clinical suspicion, recommend CT.

## 2012-07-28 ENCOUNTER — Telehealth (HOSPITAL_COMMUNITY): Payer: Self-pay | Admitting: *Deleted

## 2012-08-01 ENCOUNTER — Emergency Department (HOSPITAL_COMMUNITY): Payer: Medicare Other

## 2012-08-01 ENCOUNTER — Inpatient Hospital Stay (HOSPITAL_COMMUNITY)
Admission: EM | Admit: 2012-08-01 | Discharge: 2012-08-06 | DRG: 871 | Disposition: A | Discharge status: Other Acute Inpatient Hospital | Payer: Medicare Other | Attending: Internal Medicine | Admitting: Internal Medicine

## 2012-08-01 DIAGNOSIS — E871 Hypo-osmolality and hyponatremia: Secondary | ICD-10-CM | POA: Diagnosis present

## 2012-08-01 DIAGNOSIS — G8250 Quadriplegia, unspecified: Secondary | ICD-10-CM | POA: Diagnosis present

## 2012-08-01 DIAGNOSIS — Z933 Colostomy status: Secondary | ICD-10-CM

## 2012-08-01 DIAGNOSIS — I10 Essential (primary) hypertension: Secondary | ICD-10-CM | POA: Diagnosis present

## 2012-08-01 DIAGNOSIS — E869 Volume depletion, unspecified: Secondary | ICD-10-CM | POA: Diagnosis present

## 2012-08-01 DIAGNOSIS — E669 Obesity, unspecified: Secondary | ICD-10-CM | POA: Diagnosis present

## 2012-08-01 DIAGNOSIS — Z936 Other artificial openings of urinary tract status: Secondary | ICD-10-CM

## 2012-08-01 DIAGNOSIS — N390 Urinary tract infection, site not specified: Secondary | ICD-10-CM | POA: Diagnosis present

## 2012-08-01 DIAGNOSIS — J45909 Unspecified asthma, uncomplicated: Secondary | ICD-10-CM | POA: Diagnosis present

## 2012-08-01 DIAGNOSIS — D72829 Elevated white blood cell count, unspecified: Secondary | ICD-10-CM | POA: Diagnosis present

## 2012-08-01 DIAGNOSIS — G4733 Obstructive sleep apnea (adult) (pediatric): Secondary | ICD-10-CM | POA: Diagnosis present

## 2012-08-01 DIAGNOSIS — M009 Pyogenic arthritis, unspecified: Secondary | ICD-10-CM | POA: Diagnosis present

## 2012-08-01 DIAGNOSIS — A419 Sepsis, unspecified organism: Principal | ICD-10-CM | POA: Diagnosis present

## 2012-08-01 DIAGNOSIS — T798XXD Other early complications of trauma, subsequent encounter: Secondary | ICD-10-CM

## 2012-08-01 DIAGNOSIS — Z8614 Personal history of Methicillin resistant Staphylococcus aureus infection: Secondary | ICD-10-CM

## 2012-08-01 DIAGNOSIS — E876 Hypokalemia: Secondary | ICD-10-CM | POA: Diagnosis present

## 2012-08-01 DIAGNOSIS — R197 Diarrhea, unspecified: Secondary | ICD-10-CM | POA: Diagnosis present

## 2012-08-01 DIAGNOSIS — L89154 Pressure ulcer of sacral region, stage 4: Secondary | ICD-10-CM | POA: Diagnosis present

## 2012-08-01 DIAGNOSIS — Z6834 Body mass index (BMI) 34.0-34.9, adult: Secondary | ICD-10-CM

## 2012-08-01 DIAGNOSIS — D638 Anemia in other chronic diseases classified elsewhere: Secondary | ICD-10-CM | POA: Diagnosis present

## 2012-08-01 DIAGNOSIS — Z9359 Other cystostomy status: Secondary | ICD-10-CM

## 2012-08-01 DIAGNOSIS — IMO0002 Reserved for concepts with insufficient information to code with codable children: Secondary | ICD-10-CM | POA: Diagnosis present

## 2012-08-01 DIAGNOSIS — K5641 Fecal impaction: Secondary | ICD-10-CM | POA: Diagnosis present

## 2012-08-01 DIAGNOSIS — Z79899 Other long term (current) drug therapy: Secondary | ICD-10-CM

## 2012-08-01 DIAGNOSIS — G40909 Epilepsy, unspecified, not intractable, without status epilepticus: Secondary | ICD-10-CM | POA: Diagnosis present

## 2012-08-01 DIAGNOSIS — L89109 Pressure ulcer of unspecified part of back, unspecified stage: Secondary | ICD-10-CM | POA: Diagnosis present

## 2012-08-01 DIAGNOSIS — Z981 Arthrodesis status: Secondary | ICD-10-CM

## 2012-08-01 DIAGNOSIS — L8994 Pressure ulcer of unspecified site, stage 4: Secondary | ICD-10-CM | POA: Diagnosis present

## 2012-08-01 DIAGNOSIS — Z8711 Personal history of peptic ulcer disease: Secondary | ICD-10-CM

## 2012-08-01 DIAGNOSIS — R509 Fever, unspecified: Secondary | ICD-10-CM

## 2012-08-01 DIAGNOSIS — M86659 Other chronic osteomyelitis, unspecified thigh: Secondary | ICD-10-CM | POA: Diagnosis present

## 2012-08-01 DIAGNOSIS — E662 Morbid (severe) obesity with alveolar hypoventilation: Secondary | ICD-10-CM | POA: Diagnosis present

## 2012-08-01 DIAGNOSIS — J9610 Chronic respiratory failure, unspecified whether with hypoxia or hypercapnia: Secondary | ICD-10-CM | POA: Diagnosis present

## 2012-08-01 DIAGNOSIS — R6510 Systemic inflammatory response syndrome (SIRS) of non-infectious origin without acute organ dysfunction: Secondary | ICD-10-CM

## 2012-08-01 LAB — COMPREHENSIVE METABOLIC PANEL
ALT: 18 U/L (ref 0–53)
AST: 21 U/L (ref 0–37)
Albumin: 2.2 g/dL — ABNORMAL LOW (ref 3.5–5.2)
Alkaline Phosphatase: 104 U/L (ref 39–117)
Chloride: 96 mEq/L (ref 96–112)
Potassium: 3.8 mEq/L (ref 3.5–5.1)
Sodium: 133 mEq/L — ABNORMAL LOW (ref 135–145)
Total Bilirubin: 0.2 mg/dL — ABNORMAL LOW (ref 0.3–1.2)
Total Protein: 8.7 g/dL — ABNORMAL HIGH (ref 6.0–8.3)

## 2012-08-01 LAB — CBC WITH DIFFERENTIAL/PLATELET
Eosinophils Relative: 1 % (ref 0–5)
Lymphocytes Relative: 11 % — ABNORMAL LOW (ref 12–46)
Lymphs Abs: 1.9 10*3/uL (ref 0.7–4.0)
MCV: 73.6 fL — ABNORMAL LOW (ref 78.0–100.0)
Monocytes Relative: 16 % — ABNORMAL HIGH (ref 3–12)
Platelets: 477 10*3/uL — ABNORMAL HIGH (ref 150–400)
RBC: 3.63 MIL/uL — ABNORMAL LOW (ref 4.22–5.81)
WBC: 17.4 10*3/uL — ABNORMAL HIGH (ref 4.0–10.5)

## 2012-08-01 LAB — URINALYSIS, ROUTINE W REFLEX MICROSCOPIC
Nitrite: NEGATIVE
Specific Gravity, Urine: 1.022 (ref 1.005–1.030)
Urobilinogen, UA: 0.2 mg/dL (ref 0.0–1.0)
pH: 5.5 (ref 5.0–8.0)

## 2012-08-01 LAB — URINE MICROSCOPIC-ADD ON

## 2012-08-01 LAB — CG4 I-STAT (LACTIC ACID): Lactic Acid, Venous: 1.68 mmol/L (ref 0.5–2.2)

## 2012-08-01 MED ORDER — SODIUM CHLORIDE 0.9 % IV SOLN
Freq: Once | INTRAVENOUS | Status: AC
  Administered 2012-08-02: via INTRAVENOUS

## 2012-08-01 MED ORDER — ONDANSETRON HCL 4 MG/2ML IJ SOLN
4.0000 mg | Freq: Once | INTRAMUSCULAR | Status: AC
  Administered 2012-08-01: 4 mg via INTRAVENOUS
  Filled 2012-08-01: qty 2

## 2012-08-01 MED ORDER — ACETAMINOPHEN 325 MG PO TABS
650.0000 mg | ORAL_TABLET | Freq: Once | ORAL | Status: AC
  Administered 2012-08-01: 650 mg via ORAL
  Filled 2012-08-01: qty 2

## 2012-08-01 MED ORDER — PIPERACILLIN-TAZOBACTAM 3.375 G IVPB
3.3750 g | Freq: Once | INTRAVENOUS | Status: AC
  Administered 2012-08-02: 3.375 g via INTRAVENOUS
  Filled 2012-08-01: qty 50

## 2012-08-01 MED ORDER — SODIUM CHLORIDE 0.9 % IV SOLN
Freq: Once | INTRAVENOUS | Status: AC
  Administered 2012-08-01: 22:00:00 via INTRAVENOUS

## 2012-08-01 NOTE — ED Notes (Signed)
[id number]<QI> Expected date:<BR> Expected time:<BR> Means of arrival:<BR> Comments:<BR> EMS/47 yo male-quad/recent infection

## 2012-08-01 NOTE — ED Notes (Signed)
EDP, Dr. Benjiman Core, was notified of pt's hardstick condition--- stated okay to obtain one set of blood culture.
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
## 2012-08-01 NOTE — ED Notes (Signed)
Brought in by EMS from home with c/o nausea and vomiting x 2 days.  Per EMS, pt was diagnosed with "bacteria in blood" a week ago; pt has been having emesis and could not keep anything down for the last 2 days; pt arrived to ED room with chills--- temp 102.6 orally.

## 2012-08-01 NOTE — ED Notes (Signed)
Pt. Temperature elevated 102.6. RN, Isaias Cowman made aware.

## 2012-08-02 ENCOUNTER — Encounter (HOSPITAL_COMMUNITY): Payer: Self-pay | Admitting: Emergency Medicine

## 2012-08-02 ENCOUNTER — Inpatient Hospital Stay (HOSPITAL_COMMUNITY): Payer: Medicare Other

## 2012-08-02 DIAGNOSIS — IMO0002 Reserved for concepts with insufficient information to code with codable children: Secondary | ICD-10-CM | POA: Diagnosis present

## 2012-08-02 DIAGNOSIS — R509 Fever, unspecified: Secondary | ICD-10-CM

## 2012-08-02 DIAGNOSIS — N390 Urinary tract infection, site not specified: Secondary | ICD-10-CM

## 2012-08-02 DIAGNOSIS — E871 Hypo-osmolality and hyponatremia: Secondary | ICD-10-CM

## 2012-08-02 LAB — CBC
HCT: 21.3 % — ABNORMAL LOW (ref 39.0–52.0)
Hemoglobin: 6.5 g/dL — CL (ref 13.0–17.0)
RBC: 2.9 MIL/uL — ABNORMAL LOW (ref 4.22–5.81)

## 2012-08-02 LAB — BASIC METABOLIC PANEL
BUN: 15 mg/dL (ref 6–23)
CO2: 22 mEq/L (ref 19–32)
Glucose, Bld: 136 mg/dL — ABNORMAL HIGH (ref 70–99)
Potassium: 3.6 mEq/L (ref 3.5–5.1)
Sodium: 134 mEq/L — ABNORMAL LOW (ref 135–145)

## 2012-08-02 LAB — PREPARE RBC (CROSSMATCH)

## 2012-08-02 MED ORDER — SODIUM CHLORIDE 0.9 % IJ SOLN
3.0000 mL | Freq: Two times a day (BID) | INTRAMUSCULAR | Status: DC
  Administered 2012-08-02: 3 mL via INTRAVENOUS
  Administered 2012-08-04: 21:00:00 via INTRAVENOUS
  Administered 2012-08-05: 3 mL via INTRAVENOUS

## 2012-08-02 MED ORDER — VANCOMYCIN HCL 10 G IV SOLR
2000.0000 mg | Freq: Once | INTRAVENOUS | Status: AC
  Administered 2012-08-02: 2000 mg via INTRAVENOUS
  Filled 2012-08-02: qty 2000

## 2012-08-02 MED ORDER — PROMETHAZINE HCL 25 MG/ML IJ SOLN: 12.5000 mg | Freq: Three times a day (TID) | INTRAMUSCULAR | Status: DC | PRN

## 2012-08-02 MED ORDER — FUROSEMIDE 20 MG PO TABS
20.0000 mg | ORAL_TABLET | Freq: Every day | ORAL | Status: DC
Start: 2012-08-03 — End: 2012-08-06
  Administered 2012-08-03 – 2012-08-06 (×4): 20 mg via ORAL
  Filled 2012-08-02 (×4): qty 1

## 2012-08-02 MED ORDER — BACLOFEN 20 MG PO TABS
20.0000 mg | ORAL_TABLET | Freq: Four times a day (QID) | ORAL | Status: DC
  Administered 2012-08-02 – 2012-08-06 (×16): 20 mg via ORAL
  Filled 2012-08-02 (×20): qty 1

## 2012-08-02 MED ORDER — ACETAMINOPHEN 650 MG RE SUPP: 650.0000 mg | Freq: Four times a day (QID) | RECTAL | Status: DC | PRN

## 2012-08-02 MED ORDER — ADULT MULTIVITAMIN W/MINERALS CH
1.0000 | ORAL_TABLET | Freq: Every morning | ORAL | Status: DC
  Administered 2012-08-02 – 2012-08-06 (×5): 1 via ORAL
  Filled 2012-08-02 (×5): qty 1

## 2012-08-02 MED ORDER — FERROUS SULFATE 325 (65 FE) MG PO TABS
325.0000 mg | ORAL_TABLET | Freq: Three times a day (TID) | ORAL | Status: DC
  Administered 2012-08-02 – 2012-08-06 (×12): 325 mg via ORAL
  Filled 2012-08-02 (×16): qty 1

## 2012-08-02 MED ORDER — FAMOTIDINE 20 MG PO TABS
20.0000 mg | ORAL_TABLET | Freq: Two times a day (BID) | ORAL | Status: DC
  Administered 2012-08-02 – 2012-08-06 (×9): 20 mg via ORAL
  Filled 2012-08-02 (×10): qty 1

## 2012-08-02 MED ORDER — CIPROFLOXACIN HCL 500 MG PO TABS
500.0000 mg | ORAL_TABLET | Freq: Two times a day (BID) | ORAL | Status: DC
  Administered 2012-08-02 – 2012-08-03 (×3): 500 mg via ORAL
  Filled 2012-08-02 (×5): qty 1

## 2012-08-02 MED ORDER — SODIUM CHLORIDE 0.9 % IV SOLN
INTRAVENOUS | Status: DC
  Administered 2012-08-03 – 2012-08-05 (×2): via INTRAVENOUS

## 2012-08-02 MED ORDER — ACETAMINOPHEN 325 MG PO TABS
650.0000 mg | ORAL_TABLET | Freq: Four times a day (QID) | ORAL | Status: DC | PRN
  Administered 2012-08-02: 650 mg via ORAL
  Filled 2012-08-02: qty 2

## 2012-08-02 MED ORDER — VITAMIN C 500 MG PO TABS
500.0000 mg | ORAL_TABLET | Freq: Two times a day (BID) | ORAL | Status: DC
  Administered 2012-08-02 – 2012-08-06 (×9): 500 mg via ORAL
  Filled 2012-08-02 (×10): qty 1

## 2012-08-02 MED ORDER — ALBUTEROL SULFATE (5 MG/ML) 0.5% IN NEBU: 2.5000 mg | INHALATION_SOLUTION | RESPIRATORY_TRACT | Status: DC | PRN

## 2012-08-02 MED ORDER — METOCLOPRAMIDE HCL 10 MG PO TABS
10.0000 mg | ORAL_TABLET | Freq: Three times a day (TID) | ORAL | Status: DC
  Administered 2012-08-02 – 2012-08-06 (×12): 10 mg via ORAL
  Filled 2012-08-02 (×16): qty 1

## 2012-08-02 MED ORDER — ZINC SULFATE 220 (50 ZN) MG PO CAPS
220.0000 mg | ORAL_CAPSULE | Freq: Every morning | ORAL | Status: DC
  Administered 2012-08-02 – 2012-08-06 (×5): 220 mg via ORAL
  Filled 2012-08-02 (×5): qty 1

## 2012-08-02 MED ORDER — IOHEXOL 300 MG/ML  SOLN
25.0000 mL | INTRAMUSCULAR | Status: AC
  Administered 2012-08-02 (×2): 25 mL via ORAL

## 2012-08-02 MED ORDER — IOHEXOL 300 MG/ML  SOLN
100.0000 mL | Freq: Once | INTRAMUSCULAR | Status: AC | PRN
  Administered 2012-08-02: 100 mL via INTRAVENOUS

## 2012-08-02 MED ORDER — ONDANSETRON HCL 4 MG PO TABS: 4.0000 mg | ORAL_TABLET | Freq: Four times a day (QID) | ORAL | Status: DC | PRN

## 2012-08-02 MED ORDER — POTASSIUM CHLORIDE CRYS ER 20 MEQ PO TBCR
20.0000 meq | EXTENDED_RELEASE_TABLET | Freq: Every morning | ORAL | Status: DC
  Administered 2012-08-03 – 2012-08-06 (×3): 20 meq via ORAL
  Filled 2012-08-02 (×4): qty 1

## 2012-08-02 MED ORDER — ONDANSETRON HCL 4 MG/2ML IJ SOLN
INTRAMUSCULAR | Status: AC
  Administered 2012-08-02: 4 mg
  Filled 2012-08-02: qty 2

## 2012-08-02 MED ORDER — ONDANSETRON HCL 4 MG/2ML IJ SOLN: 4.0000 mg | Freq: Four times a day (QID) | INTRAMUSCULAR | Status: DC | PRN

## 2012-08-02 MED ORDER — ENOXAPARIN SODIUM 40 MG/0.4ML ~~LOC~~ SOLN
40.0000 mg | SUBCUTANEOUS | Status: DC
  Administered 2012-08-02 – 2012-08-03 (×2): 40 mg via SUBCUTANEOUS
  Filled 2012-08-02 (×2): qty 0.4

## 2012-08-02 MED ORDER — VANCOMYCIN HCL IN DEXTROSE 1-5 GM/200ML-% IV SOLN
1000.0000 mg | Freq: Two times a day (BID) | INTRAVENOUS | Status: DC
  Administered 2012-08-03 – 2012-08-05 (×5): 1000 mg via INTRAVENOUS
  Filled 2012-08-02 (×7): qty 200

## 2012-08-02 MED ORDER — PIPERACILLIN-TAZOBACTAM 3.375 G IVPB
3.3750 g | Freq: Three times a day (TID) | INTRAVENOUS | Status: DC
  Administered 2012-08-02 – 2012-08-06 (×12): 3.375 g via INTRAVENOUS
  Filled 2012-08-02 (×15): qty 50

## 2012-08-02 MED ORDER — DOCUSATE SODIUM 100 MG PO CAPS
100.0000 mg | ORAL_CAPSULE | Freq: Two times a day (BID) | ORAL | Status: DC
  Administered 2012-08-02 – 2012-08-06 (×9): 100 mg via ORAL
  Filled 2012-08-02 (×10): qty 1

## 2012-08-02 NOTE — Progress Notes (Signed)
   CARE MANAGEMENT NOTE 08/02/2012  Patient:  [name] [name],[name] [name]   Account Number:  [id number]  Date Initiated:  08/02/2012  Documentation initiated by:  Jiles Crocker  Subjective/Objective Assessment:   ADMITTED WITH UTI, SEPSIS SYNDROME     Action/Plan:   PCP: [name], MD  LIVES AT HOME WITH HIS CAREGIVER; IS ACTIVE WITH [facility] AS PRIOR TO ADMISSION   Anticipated DC Date:  08/09/2012   Anticipated DC Plan:  HOME W HOME HEALTH SERVICES      DC Planning Services  CM consult         Status of service:  In process, will continue to follow Medicare Important Message given?  NA - LOS <3 / Initial given by admissions (If response is "NO", the following Medicare IM given date fields will be blank) Per UR Regulation:  Reviewed for med. necessity/level of care/duration of stay Comments:  08/02/2012- B Hesham Womac RN,BSN,MHA

## 2012-08-02 NOTE — Progress Notes (Signed)
Placed patient on CPAP of 19.0 cmH20 per home setting. Tolerating well at this time. Encouraged to call if he has any questions or concerns, RT will continue to monitor.

## 2012-08-02 NOTE — Progress Notes (Signed)
Assessed for PIV, I did not visualized nor palpate any peripheral veins to attempt an IV start. Tess RN notified I was unable to get a second PIV. Consuello Masse
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
## 2012-08-02 NOTE — Progress Notes (Signed)
criticalCRITICAL VALUE ALERT  Critical value received:  Hgb=6.5  Date of notification:  08/02/12  Time of notification:  0705  Critical value read back:yes  Nurse who received alert:  Tamala Bari  MD notified (1st page):  Dr Carmell Austria  Time of first page:  07:19  MD notified (2nd page):  Time of second page:  Responding MD:  Dr Sunnie Nielsen  Time MD responded:  07:30
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
## 2012-08-02 NOTE — Plan of Care (Signed)
Problem: Consults Goal: Skin Care Protocol Initiated - if Braden Score 18 or less If consults are not indicated, leave blank or document N/A Outcome: Progressing dsg's chg'd on arrival to unit- wet to dry dsg's applied to all wounds. Awaiting WOC-RN eval & recommendations

## 2012-08-02 NOTE — Progress Notes (Addendum)
[facility] PROGRESS NOTE  [name] [name] ZOX:[id number] DOB: November 11, 1965 DOA: 08/01/2012 PCP: [name], MD  Assessment/Plan: 1. Sepsis: could be secondary to UTI, but with history of decubitus ulcer and pelvis x ray questioning gas will get a pelvis CT scan with contrast. Continue with Zosyn day 2 antibiotics. Will add vancomycin, he has prior history of MRSA wound infection.  2. Decubitus ulcer stage iv. Pelvis Ct scan to better evaluates. Wound care consulted.  3. Diarrhea: Check C diff.  4. Anemia: Will transfuse 2 units. Hb baseline 7 to 8.  5.   Leukocytosis: likely secondary to infection. Continue with IV antibiotics.  6.   Propionibacterium blood culture 3/08. repeat blood culture pending.   Code Status: Full  Family Communication: Care discussed with patient.  Disposition Plan: how when stable.   Consultants:  none  Procedures:  None.  Antibiotics:  Zosyn  HPI/Subjective: Feeling better. He presents to ED yesterday because of decrease appetite. Last time he saw Dr Allyne Gee was 2 weeks ago. She was planing to do surgery.   Objective: Filed Vitals:   08/02/12 0312 08/02/12 0638 08/02/12 1145 08/02/12 1210  BP: 131/102 106/45 95/49 98/46   Pulse: 107 109 107 108  Temp: 100.3 F (37.9 C) 102.6 F (39.2 C) 98.4 F (36.9 C) 98.8 F (37.1 C)  TempSrc: Oral Oral Oral Oral  Resp: 20 20 20 18   Height: 6' (1.829 m)     Weight: 107.7 kg (237 lb 7 oz)     SpO2: 99% 100%      Intake/Output Summary (Last 24 hours) at 08/02/12 1236 Last data filed at 08/02/12 1155  Gross per 24 hour  Intake   2880 ml  Output    227 ml  Net   2653 ml   Filed Weights   08/02/12 0312  Weight: 107.7 kg (237 lb 7 oz)    Exam:   General:  No distress.  Cardiovascular: S 1, S 2 RRR  Respiratory: CTA  Abdomen: distended, colostomy with watery stool.   Musculoskeletal: no edema. Quadriplegic  Data Reviewed: Basic Metabolic Panel:  Recent Labs Lab 08/01/12 2125  08/02/12 0635  NA 133* 134*  K 3.8 3.6  CL 96 100  CO2 22 22  GLUCOSE 111* 136*  BUN 14 15  CREATININE 0.51 0.66  CALCIUM 9.4 8.7   Liver Function Tests:  Recent Labs Lab 08/01/12 2125  AST 21  ALT 18  ALKPHOS 104  BILITOT 0.2*  PROT 8.7*  ALBUMIN 2.2*   No results found for this basename: LIPASE, AMYLASE,  in the last 168 hours No results found for this basename: AMMONIA,  in the last 168 hours CBC:  Recent Labs Lab 08/01/12 2125 08/02/12 0635  WBC 17.4* 17.7*  NEUTROABS 12.5*  --   HGB 7.9* 6.5*  HCT 26.7* 21.3*  MCV 73.6* 73.4*  PLT 477* 402*   Cardiac Enzymes: No results found for this basename: CKTOTAL, CKMB, CKMBINDEX, TROPONINI,  in the last 168 hours BNP (last 3 results) No results found for this basename: PROBNP,  in the last 8760 hours CBG: No results found for this basename: GLUCAP,  in the last 168 hours  No results found for this or any previous visit (from the past 240 hour(s)).   Studies: Dg Chest 2 View  08/01/2012  *RADIOLOGY REPORT*  Clinical Data: Fever and vomiting  CHEST - 2 VIEW  Comparison: 07/21/2012  Findings: Nodular scarring with calcifications in the upper lungs suggesting postinflammatory change.  This is  stable.  Shallow inspiration.  Cardiac enlargement.  Bilateral pleural thickening in the apices.  Old left rib fractures.  No pulmonary vascular congestion or edema.  No focal consolidation.  No apparent blunting of costophrenic angles although positioning limits evaluation. Postoperative changes in the cervical spine.  IMPRESSION: Chronic changes are stable since previous study.  Mild cardiac enlargement.  No evidence of vascular congestion, edema, or consolidation.   Original Report Authenticated By: Burman Nieves, M.D.    Dg Pelvis 1-2 Views  08/01/2012  *RADIOLOGY REPORT*  Clinical Data: Bedsore to the bottom.  PELVIS - 1-2 VIEW  Comparison: 07/23/2012  Findings: Old ununited fractures of the femoral neck bilaterally with  exuberant callus formation and heterotopic ossification. Degenerative changes in the left hip joint.  The right femoral head appears to have been resected or resorbed with remodelling and flattening of the acetabulum.  The pelvic rim, SI joints, and symphysis pubis appear grossly intact.  Gas collections projecting over the soft tissues of the upper thigh region bilaterally.  These changes are stable and may represent herniated bowel there appears to be residual contrast material in the urethra.  IMPRESSION: Chronic changes in the hips with old fracture deformities, resection of the right femoral head, and exuberant callus formation and heterotopic ossification around the hips.  Nonspecific gas collections superimposed over the soft tissues of the upper thigh region bilaterally may represent herniated bowel.  The extensive chronic bony abnormalities, osteomyelitis cannot be excluded.   Original Report Authenticated By: Burman Nieves, M.D.    Dg Abd 2 Views  08/01/2012  *RADIOLOGY REPORT*  Clinical Data: Abdominal pain  ABDOMEN - 2 VIEW  Comparison: Pelvis 08/01/2012, abdomen 05/17/2012  Findings: Interval removal of enteric tube.  Mild prominence of gas- filled stomach without significant distension.  Gas filled small and large bowel loops are present without significant distension. Changes suggest ileus.  No free intra-abdominal air on decubitus views.  Suggestion of infiltration or atelectasis in the lung bases.  IMPRESSION: Mild prominence of gas-filled stomach and bowel suggesting ileus. No evidence of obstruction or free air.   Original Report Authenticated By: Burman Nieves, M.D.     Scheduled Meds: . baclofen  20 mg Oral QID  . ciprofloxacin  500 mg Oral BID  . docusate sodium  100 mg Oral BID  . enoxaparin (LOVENOX) injection  40 mg Subcutaneous Q24H  . famotidine  20 mg Oral BID  . ferrous sulfate  325 mg Oral TID WC  . [START ON 08/03/2012] furosemide  20 mg Oral Daily  . metoCLOPramide   10 mg Oral TID AC  . multivitamin with minerals  1 tablet Oral q morning - 10a  . piperacillin-tazobactam (ZOSYN)  IV  3.375 g Intravenous Q8H  . [START ON 08/03/2012] potassium chloride SA  20 mEq Oral q morning - 10a  . sodium chloride  3 mL Intravenous Q12H  . vitamin C  500 mg Oral BID  . zinc sulfate  220 mg Oral q morning - 10a   Continuous Infusions: . sodium chloride      Active Problems:   Obesity   Quadriplegia   Hyponatremia   Leukocytosis   Sacral decubitus ulcer, stage IV   Complicated UTI (urinary tract infection)   Sepsis syndrome    Time spent: 25 minutes.    Atiana Levier  [facility] Pager [phone]. If 7PM-7AM, please contact night-coverage at [email or web address], password [facility] 08/02/2012, 12:36 PM  LOS: 1 day

## 2012-08-03 ENCOUNTER — Encounter (HOSPITAL_COMMUNITY)
Admission: EM | Disposition: A | Discharge status: Other Acute Inpatient Hospital | Payer: Self-pay | Source: Home / Self Care | Attending: Internal Medicine

## 2012-08-03 ENCOUNTER — Encounter (HOSPITAL_COMMUNITY): Payer: Self-pay | Admitting: *Deleted

## 2012-08-03 DIAGNOSIS — M009 Pyogenic arthritis, unspecified: Secondary | ICD-10-CM

## 2012-08-03 DIAGNOSIS — L89109 Pressure ulcer of unspecified part of back, unspecified stage: Secondary | ICD-10-CM

## 2012-08-03 DIAGNOSIS — L8994 Pressure ulcer of unspecified site, stage 4: Secondary | ICD-10-CM

## 2012-08-03 DIAGNOSIS — M869 Osteomyelitis, unspecified: Secondary | ICD-10-CM

## 2012-08-03 DIAGNOSIS — A419 Sepsis, unspecified organism: Secondary | ICD-10-CM

## 2012-08-03 LAB — BASIC METABOLIC PANEL
CO2: 23 mEq/L (ref 19–32)
Calcium: 8.6 mg/dL (ref 8.4–10.5)
Creatinine, Ser: 0.54 mg/dL (ref 0.50–1.35)
Glucose, Bld: 100 mg/dL — ABNORMAL HIGH (ref 70–99)

## 2012-08-03 LAB — URINE CULTURE: Special Requests: NORMAL

## 2012-08-03 LAB — CBC
MCH: 23.2 pg — ABNORMAL LOW (ref 26.0–34.0)
MCV: 75.2 fL — ABNORMAL LOW (ref 78.0–100.0)
Platelets: 386 10*3/uL (ref 150–400)
RBC: 3.06 MIL/uL — ABNORMAL LOW (ref 4.22–5.81)

## 2012-08-03 SURGERY — ARTHROPLASTY, HIP, TOTAL,POSTERIOR APPROACH
Anesthesia: General

## 2012-08-03 MED ORDER — SODIUM CHLORIDE 0.9 % IJ SOLN
10.0000 mL | INTRAMUSCULAR | Status: DC | PRN
  Administered 2012-08-03: 10 mL

## 2012-08-03 NOTE — Consult Note (Signed)
WOC consult Note Reason for Consult: Extensive pressure ulcers involving the sacrum, bilateral ischial tuberosities and the soft tissue of the buttocks. Full thickness tissue injury on right lateral LE.  Patient is known to me from numerous previous admissions. He is on a LALM for pressure redistribution and is frequently reluctant to turn off of his back; refusing turning by the nursing technicians. He has a thermal/pressure injury to the left knee. Wound type:Pressure and Pressure vs thermal Pressure Ulcer POA: Yes Measurement:Right knee:  3.5cm x 3.5cm with soft dark eschar in center.  Left lateral LE: 4cm x  3cm area of dry eschar.  Sacral/Buttocks/ischial tuberosity wounds (approximate measurements):  20cm x 26cm x 1cm with area of tunneling measuring 4cm at center of the sacral defect.  Firm surface palpated beneath thin layer of smooth, non-granulating tissue; suspect wound base is very near bone.  Wound bed: generally pink, but non-granulating. Other wounds with necrotic tissue as noted above. Drainage (amount, consistency, odor) serosanguinous exudate on old dressings on trunk.  No drainage from dry eschar on right lateral LE or from left knee wound. Periwound: Dressing procedure/placement/frequency: Patient examined and assessed with Ortho PA.  Photographs taken of wounds for consultative purposes with orthopedic surgeon. We agreed that at this time, conservative dressings arte most appropriate.  Saline dressings changed twice daily and as needed for loosening or soiled dressings.  WOC ostomy consult  Stoma type/location: LLQ colostomy Stomal assessment/size: 1 and 1/2 inches round, elevated stoma.  Os in center.  Peristomal assessment: Not seen today as pouch not changed Treatment options for stomal/peristomal skin: None indicated (see above) Output soft brown stool. Ostomy pouching: 2pc. (Pouch Lawson #[id number], skin barrier # [phone])   I will not follow, but will remain available to this  patient and his medical and nursing staff..  Please re-consult if needed. Thanks, Ladona Mow, MSN, RN, [facility], CWOCN [id number])

## 2012-08-03 NOTE — Progress Notes (Addendum)
[facility] PROGRESS NOTE  Karandeep Resende WGN:[id number] DOB: 01/31/1966 DOA: 08/01/2012 PCP: [name], MD  Assessment/Plan: 1. Sepsis: UTI vs septic Joint.  Continue with Zosyn and Vancomycin  day 2 antibiotics. Vancomycin, because he has prior history of MRSA wound infection. Patient afebrile, BP stable.  2. Decubitus ulcer stage iv. Pelvis Ct scan show chronic destruction of 4 and 5 th sacral segments. unchanged appearance since Sept 2013.  Wound care consulted.  3. Diarrhea: C diff negative.  4. Anemia: S?P  2 units PRBC transfusion 3-20. Hb baseline 7 to 8.  5.   Leukocytosis: likely secondary to infection. Continue with IV antibiotics. WBC pending this morning. 6.   Propionibacterium blood culture 3/08. repeat blood culture pending. Current antibiotics regimen should cover.   7.   Large right hip Joint effusion: concern with septic joint. Continue with IV antibiotics. I made patient NPO. Dr             Madelon Lips consulted.  8.   Possible Rectosigmoid Fecal impaction by x ray: Will ask nurse to check for fecal impaction and disimpact.  9.   Yeast urine: Will follow blood cultures.   Code Status: Full  Family Communication: Care discussed with patient.  Disposition Plan: how when stable.   Consultants:  none  Procedures:  None.  Antibiotics:  Zosyn 3/2-  Vancomycin 3/20  HPI/Subjective: Feeling ok, no complaints. I inform him Ct hip results.   Objective: Filed Vitals:   08/02/12 2234 08/03/12 0225 08/03/12 0245 08/03/12 0547  BP: 114/55 97/45 103/45 105/53  Pulse: 98 88 88 84  Temp: 98.5 F (36.9 C) 98.3 F (36.8 C) 98.3 F (36.8 C) 98 F (36.7 C)  TempSrc: Oral Axillary Axillary Oral  Resp: 22 19 19 20   Height:      Weight:      SpO2: 97%   99%    Intake/Output Summary (Last 24 hours) at 08/03/12 0834 Last data filed at 08/03/12 0556  Gross per 24 hour  Intake 1572.5 ml  Output   1652 ml  Net  -79.5 ml   Filed Weights   08/02/12 0312   Weight: 107.7 kg (237 lb 7 oz)    Exam:   General:  No distress.  Cardiovascular: S 1, S 2 RRR  Respiratory: CTA  Abdomen: distended, colostomy with watery stool, no rigidity, no guarding.   Musculoskeletal: no edema. Quadriplegic.  Data Reviewed: Basic Metabolic Panel:  Recent Labs Lab 08/01/12 2125 08/02/12 0635  NA 133* 134*  K 3.8 3.6  CL 96 100  CO2 22 22  GLUCOSE 111* 136*  BUN 14 15  CREATININE 0.51 0.66  CALCIUM 9.4 8.7   Liver Function Tests:  Recent Labs Lab 08/01/12 2125  AST 21  ALT 18  ALKPHOS 104  BILITOT 0.2*  PROT 8.7*  ALBUMIN 2.2*   No results found for this basename: LIPASE, AMYLASE,  in the last 168 hours No results found for this basename: AMMONIA,  in the last 168 hours CBC:  Recent Labs Lab 08/01/12 2125 08/02/12 0635  WBC 17.4* 17.7*  NEUTROABS 12.5*  --   HGB 7.9* 6.5*  HCT 26.7* 21.3*  MCV 73.6* 73.4*  PLT 477* 402*   Cardiac Enzymes: No results found for this basename: CKTOTAL, CKMB, CKMBINDEX, TROPONINI,  in the last 168 hours BNP (last 3 results) No results found for this basename: PROBNP,  in the last 8760 hours CBG: No results found for this basename: GLUCAP,  in the last 168  hours  Recent Results (from the past 240 hour(s))  CULTURE, BLOOD (ROUTINE X 2)     Status: None   Collection Time    08/01/12  9:25 PM      Result Value Range Status   Specimen Description BLOOD RIGHT THUMB   Final   Special Requests BOTTLES DRAWN AEROBIC AND ANAEROBIC 1CC   Final   Culture  Setup Time 08/02/2012 02:42   Final   Culture     Final   Value:        BLOOD CULTURE RECEIVED NO GROWTH TO DATE CULTURE WILL BE HELD FOR 5 DAYS BEFORE ISSUING A FINAL NEGATIVE REPORT   Report Status PENDING   Incomplete  URINE CULTURE     Status: None   Collection Time    08/01/12 10:30 PM      Result Value Range Status   Specimen Description URINE, CATHETERIZED   Final   Special Requests Normal   Final   Culture  Setup Time 08/02/2012 03:33    Final   Colony Count >=100,000 COLONIES/ML   Final   Culture YEAST   Final   Report Status 08/03/2012 FINAL   Final  CLOSTRIDIUM DIFFICILE BY PCR     Status: None   Collection Time    08/02/12  1:23 PM      Result Value Range Status   C difficile by pcr NEGATIVE  NEGATIVE Final     Studies: Dg Chest 2 View  08/01/2012  *RADIOLOGY REPORT*  Clinical Data: Fever and vomiting  CHEST - 2 VIEW  Comparison: 07/21/2012  Findings: Nodular scarring with calcifications in the upper lungs suggesting postinflammatory change.  This is stable.  Shallow inspiration.  Cardiac enlargement.  Bilateral pleural thickening in the apices.  Old left rib fractures.  No pulmonary vascular congestion or edema.  No focal consolidation.  No apparent blunting of costophrenic angles although positioning limits evaluation. Postoperative changes in the cervical spine.  IMPRESSION: Chronic changes are stable since previous study.  Mild cardiac enlargement.  No evidence of vascular congestion, edema, or consolidation.   Original Report Authenticated By: Burman Nieves, M.D.    Dg Pelvis 1-2 Views  08/01/2012  *RADIOLOGY REPORT*  Clinical Data: [place] to the bottom.  PELVIS - 1-2 VIEW  Comparison: 07/23/2012  Findings: Old ununited fractures of the femoral neck bilaterally with exuberant callus formation and heterotopic ossification. Degenerative changes in the left hip joint.  The right femoral head appears to have been resected or resorbed with remodelling and flattening of the acetabulum.  The pelvic rim, SI joints, and symphysis pubis appear grossly intact.  Gas collections projecting over the soft tissues of the upper thigh region bilaterally.  These changes are stable and may represent herniated bowel there appears to be residual contrast material in the urethra.  IMPRESSION: Chronic changes in the hips with old fracture deformities, resection of the right femoral head, and exuberant callus formation and heterotopic  ossification around the hips.  Nonspecific gas collections superimposed over the soft tissues of the upper thigh region bilaterally may represent herniated bowel.  The extensive chronic bony abnormalities, osteomyelitis cannot be excluded.   Original Report Authenticated By: Burman Nieves, M.D.    Ct Pelvis W Contrast  08/02/2012  *RADIOLOGY REPORT*  Clinical Data:  Stage IV sacral decubitus ulcer.  Evaluate for pelvic abscess.  Quadriplegia.  CT PELVIS WITH CONTRAST  Technique:  Multidetector CT imaging of the pelvis was performed using the standard protocol following the bolus administration  of intravenous contrast.  Contrast: OMNIPAQUE IOHEXOL 300 MG/ML.  Oral contrast was also administered.  Comparison:  MRI pelvis 05/04/2012.  CT pelvis 01/18/2012.  Findings:  Gas within the soft tissues overlying the sacrum, consistent with the decubitus ulcer.  The fourth and fifth sacral segments have been destroyed, and the appearance is similar to that on the prior CT.  Chronic right hip dislocation.  Chronic osteomyelitis involving the fragmented proximal right femur.  There is now evidence of a very large right hip joint effusion containing gas.  Milder chronic left hip dislocation and fragmentation of the proximal left femur. Small left hip joint effusion without gas.  No evidence of abscess within the anatomic pelvis.  Large stool burden in the rectum and sigmoid colon.  Remaining visualized colon unremarkable.  Visualized small bowel unremarkable.  Visualized lower poles of both kidneys unremarkable.  Retroaortic left renal vein.  Mild bilateral iliac atherosclerosis.  Suprapubic catheter within the decompressed urinary bladder.  IMPRESSION:  1.  Large right hip joint effusion with gas bubbles, consistent with an infected joint.  This is superimposed upon chronic osteomyelitis involving the proximal right femur. 2.  Sacral decubitus ulcer with chronic destruction of the fourth and fifth sacral segments,  unchanged in appearance since September, 2013. 3.  No evidence of abscess within the anatomic pelvis. 4.  Possible rectosigmoid fecal impaction.   Original Report Authenticated By: Hulan Saas, M.D.    Dg Abd 2 Views  08/01/2012  *RADIOLOGY REPORT*  Clinical Data: Abdominal pain  ABDOMEN - 2 VIEW  Comparison: Pelvis 08/01/2012, abdomen 05/17/2012  Findings: Interval removal of enteric tube.  Mild prominence of gas- filled stomach without significant distension.  Gas filled small and large bowel loops are present without significant distension. Changes suggest ileus.  No free intra-abdominal air on decubitus views.  Suggestion of infiltration or atelectasis in the lung bases.  IMPRESSION: Mild prominence of gas-filled stomach and bowel suggesting ileus. No evidence of obstruction or free air.   Original Report Authenticated By: Burman Nieves, M.D.     Scheduled Meds: . baclofen  20 mg Oral QID  . ciprofloxacin  500 mg Oral BID  . docusate sodium  100 mg Oral BID  . enoxaparin (LOVENOX) injection  40 mg Subcutaneous Q24H  . famotidine  20 mg Oral BID  . ferrous sulfate  325 mg Oral TID WC  . furosemide  20 mg Oral Daily  . metoCLOPramide  10 mg Oral TID AC  . multivitamin with minerals  1 tablet Oral q morning - 10a  . piperacillin-tazobactam (ZOSYN)  IV  3.375 g Intravenous Q8H  . potassium chloride SA  20 mEq Oral q morning - 10a  . sodium chloride  3 mL Intravenous Q12H  . vancomycin  1,000 mg Intravenous Q12H  . vitamin C  500 mg Oral BID  . zinc sulfate  220 mg Oral q morning - 10a   Continuous Infusions: . sodium chloride      Active Problems:   Obesity   Quadriplegia   Hyponatremia   Leukocytosis   Sacral decubitus ulcer, stage IV   Complicated UTI (urinary tract infection)   Sepsis syndrome    Time spent: 25 minutes.    Ikia Cincotta  [facility] Pager [phone]. If 7PM-7AM, please contact night-coverage at [email or web address], password [facility] 08/03/2012, 8:34  AM  LOS: 2 days   I spoke with internal medicine at [facility] Dr Leonides Sake, she wont accept patient without orthopedic involve at [facility]. Dr Evangeline Dakin spoke  with DR Daphine Deutscher from [place] and they don't feel that there is an emergent indication for surgery. I will contact again tomorrow to Dr Kelly Splinter to see who is covering for her. I Consulted IR for joint aspiration for 3-22.
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
## 2012-08-03 NOTE — Preoperative (Signed)
Beta Blockers   Reason not to administer Beta Blockers:Not Applicable, not on home BB 

## 2012-08-03 NOTE — Progress Notes (Signed)
Peripherally Inserted Central Catheter/Midline Placement  The IV Nurse has discussed with the patient and/or persons authorized to consent for the patient, the purpose of this procedure and the potential benefits and risks involved with this procedure.  The benefits include less needle sticks, lab draws from the catheter and patient may be discharged home with the catheter.  Risks include, but not limited to, infection, bleeding, blood clot (thrombus formation), and puncture of an artery; nerve damage and irregular heat beat.  Alternatives to this procedure were also discussed.  PICC/Midline Placement Documentation  PICC / Midline Single Lumen 08/03/12 PICC Left Basilic (Active)       [name] [name], [name] 08/03/2012, 12:46 PM
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
## 2012-08-03 NOTE — Progress Notes (Signed)
RT set patient up on CPAP with full face medium mask. Set to 19cm H2O per patient home settings. Sterile water added to fill line. Patient states that he is comfortable. RT encouraged him to call if he needed any further assistance.

## 2012-08-04 ENCOUNTER — Inpatient Hospital Stay (HOSPITAL_COMMUNITY): Payer: Medicare Other

## 2012-08-04 DIAGNOSIS — I10 Essential (primary) hypertension: Secondary | ICD-10-CM

## 2012-08-04 LAB — CBC
MCV: 77.1 fL — ABNORMAL LOW (ref 78.0–100.0)
Platelets: 495 10*3/uL — ABNORMAL HIGH (ref 150–400)
RBC: 3.4 MIL/uL — ABNORMAL LOW (ref 4.22–5.81)
WBC: 11.8 10*3/uL — ABNORMAL HIGH (ref 4.0–10.5)

## 2012-08-04 LAB — BASIC METABOLIC PANEL
CO2: 24 mEq/L (ref 19–32)
Chloride: 104 mEq/L (ref 96–112)
Creatinine, Ser: 0.42 mg/dL — ABNORMAL LOW (ref 0.50–1.35)
Potassium: 3 mEq/L — ABNORMAL LOW (ref 3.5–5.1)

## 2012-08-04 LAB — MRSA PCR SCREENING: MRSA by PCR: NEGATIVE

## 2012-08-04 MED ORDER — POTASSIUM CHLORIDE CRYS ER 20 MEQ PO TBCR
40.0000 meq | EXTENDED_RELEASE_TABLET | Freq: Once | ORAL | Status: AC
  Administered 2012-08-04: 40 meq via ORAL
  Filled 2012-08-04: qty 2

## 2012-08-04 NOTE — Progress Notes (Signed)
Subjective:  VSS, labwork pending today  Objective: Vital signs in last 24 hours: Temp:  [98 F (36.7 C)-98.6 F (37 C)] 98.2 F (36.8 C) (03/22 0457) Pulse Rate:  [90-98] 96 (03/22 0457) Resp:  [18-20] 20 (03/22 0457) BP: (99-139)/(54-83) 126/76 mmHg (03/22 0457) SpO2:  [98 %-100 %] 98 % (03/22 0457)  Intake/Output from previous day: 03/21 0701 - 03/22 0700 In: 1612.5 [P.O.:600; I.V.:250; Blood:362.5; IV Piggyback:400] Out: 2075 [Urine:1475; Stool:600] Intake/Output this shift:     Recent Labs  08/01/12 2125 08/02/12 0635 08/03/12 0837  HGB 7.9* 6.5* 7.1*    Recent Labs  08/02/12 0635 08/03/12 0837  WBC 17.7* 12.8*  RBC 2.90* 3.06*  HCT 21.3* 23.0*  PLT 402* 386    Recent Labs  08/02/12 0635 08/03/12 0837  NA 134* 135  K 3.6 3.3*  CL 100 102  CO2 22 23  BUN 15 11  CREATININE 0.66 0.54  GLUCOSE 136* 100*  CALCIUM 8.7 8.6   No results found for this basename: LABPT, INR,  in the last 72 hours    Assessment/Plan: 1 Day Post-Op Procedure(s) (LRB): Irrigation and debridiment right hip possible girdle stone procedure (Right) If remains afebrile could d/c home on abx pending f/u with Dr. Kelly Splinter.    Margart Sickles 08/04/2012, 10:03 AM

## 2012-08-04 NOTE — Progress Notes (Signed)
Pt back from procedure. MD notified for diet order. See new order. Vwilliams,rn.

## 2012-08-04 NOTE — Progress Notes (Signed)
[facility] PROGRESS NOTE  Leary Mcnulty UJW:[id number] DOB: 1965/10/06 DOA: 08/01/2012 PCP: [name], MD  Assessment/Plan: 1. Sepsis: ? septic Joint.  Continue with Zosyn and Vancomycin  day 3 antibiotics. Vancomycin, because he has prior history of MRSA wound infection. Patient afebrile, BP stable.  2. Decubitus ulcer stage iv. Pelvis Ct scan show chronic destruction of 4 and 5 th sacral segments. unchanged appearance since Sept 2013.  Wound care consulted. Saline dressing changes BID.  3. Diarrhea: C diff negative.  4. Anemia: S/P  2 units PRBC transfusion 3-20. Hb baseline 7 to 8.  5.   Leukocytosis: likely secondary to infection. Continue with IV antibiotics. WBC pending this morning. 6.   Propionibacterium blood culture 3/08. repeat blood culture pending. Current antibiotics regimen should cover.   7.   Large right hip Joint effusion: concern with septic joint. Continue with IV antibiotics. Dr Daphine Deutscher (From [place]) didn't feel that patient need emergent surgery. Patient for joint aspiration today. I spoke with Dr Kelly Splinter today, she will be back in 1 week. There is no Engineer, petroleum to come to [place] long. She is sure that plastic surgery will be willing to see patient if patient is transfer to [facility]. I explain to her than medicine at [facility] refuse to take the patient because ortho didn't accept to do surgery on patient. Dr Kelly Splinter advised to follow culture result if consistent with infectious to try to transfer patient again.  8.   Possible Rectosigmoid Fecal impaction by x ray: I  ask nurse again  to check for fecal impaction and disimpact.  9.   Yeast urine: Will follow blood cultures.   Code Status: Full  Family Communication: Care discussed with patient.  Disposition Plan: how when stable.   Consultants:  none  Procedures:  None.  Antibiotics:  Zosyn 3/2-  Vancomycin 3/20  HPI/Subjective: Feeling ok, no complaints. I explain  situation to patient.    Objective: Filed Vitals:   08/03/12 2005 08/03/12 2330 08/03/12 2331 08/04/12 0457  BP: 101/68  118/58 126/76  Pulse: 98  98 96  Temp: 98.1 F (36.7 C)  98 F (36.7 C) 98.2 F (36.8 C)  TempSrc: Oral  Axillary Oral  Resp: 20 18 20 20   Height:      Weight:      SpO2:   99% 98%    Intake/Output Summary (Last 24 hours) at 08/04/12 0945 Last data filed at 08/04/12 0500  Gross per 24 hour  Intake 1612.5 ml  Output   2075 ml  Net -462.5 ml   Filed Weights   08/02/12 0312  Weight: 107.7 kg (237 lb 7 oz)    Exam:   General:  No distress.  Cardiovascular: S 1, S 2 RRR  Respiratory: CTA  Abdomen: Soft, colostomy with watery stool, no rigidity, no guarding.   Musculoskeletal: no edema. Quadriplegic.  Decubitus ulcer with dressing.   Data Reviewed: Basic Metabolic Panel:  Recent Labs Lab 08/01/12 2125 08/02/12 0635 08/03/12 0837  NA 133* 134* 135  K 3.8 3.6 3.3*  CL 96 100 102  CO2 22 22 23   GLUCOSE 111* 136* 100*  BUN 14 15 11   CREATININE 0.51 0.66 0.54  CALCIUM 9.4 8.7 8.6   Liver Function Tests:  Recent Labs Lab 08/01/12 2125  AST 21  ALT 18  ALKPHOS 104  BILITOT 0.2*  PROT 8.7*  ALBUMIN 2.2*   No results found for this basename: LIPASE, AMYLASE,  in the last 168 hours No results found  for this basename: AMMONIA,  in the last 168 hours CBC:  Recent Labs Lab 08/01/12 2125 08/02/12 0635 08/03/12 0837  WBC 17.4* 17.7* 12.8*  NEUTROABS 12.5*  --   --   HGB 7.9* 6.5* 7.1*  HCT 26.7* 21.3* 23.0*  MCV 73.6* 73.4* 75.2*  PLT 477* 402* 386   Cardiac Enzymes: No results found for this basename: CKTOTAL, CKMB, CKMBINDEX, TROPONINI,  in the last 168 hours BNP (last 3 results) No results found for this basename: PROBNP,  in the last 8760 hours CBG: No results found for this basename: GLUCAP,  in the last 168 hours  Recent Results (from the past 240 hour(s))  CULTURE, BLOOD (ROUTINE X 2)     Status: None   Collection Time    08/01/12   9:25 PM      Result Value Range Status   Specimen Description BLOOD RIGHT THUMB   Final   Special Requests BOTTLES DRAWN AEROBIC AND ANAEROBIC 1CC   Final   Culture  Setup Time 08/02/2012 02:42   Final   Culture     Final   Value:        BLOOD CULTURE RECEIVED NO GROWTH TO DATE CULTURE WILL BE HELD FOR 5 DAYS BEFORE ISSUING A FINAL NEGATIVE REPORT   Report Status PENDING   Incomplete  URINE CULTURE     Status: None   Collection Time    08/01/12 10:30 PM      Result Value Range Status   Specimen Description URINE, CATHETERIZED   Final   Special Requests Normal   Final   Culture  Setup Time 08/02/2012 03:33   Final   Colony Count >=100,000 COLONIES/ML   Final   Culture YEAST   Final   Report Status 08/03/2012 FINAL   Final  CLOSTRIDIUM DIFFICILE BY PCR     Status: None   Collection Time    08/02/12  1:23 PM      Result Value Range Status   C difficile by pcr NEGATIVE  NEGATIVE Final     Studies: Ct Pelvis W Contrast  08/02/2012  *RADIOLOGY REPORT*  Clinical Data:  Stage IV sacral decubitus ulcer.  Evaluate for pelvic abscess.  Quadriplegia.  CT PELVIS WITH CONTRAST  Technique:  Multidetector CT imaging of the pelvis was performed using the standard protocol following the bolus administration of intravenous contrast.  Contrast: OMNIPAQUE IOHEXOL 300 MG/ML.  Oral contrast was also administered.  Comparison:  MRI pelvis 05/04/2012.  CT pelvis 01/18/2012.  Findings:  Gas within the soft tissues overlying the sacrum, consistent with the decubitus ulcer.  The fourth and fifth sacral segments have been destroyed, and the appearance is similar to that on the prior CT.  Chronic right hip dislocation.  Chronic osteomyelitis involving the fragmented proximal right femur.  There is now evidence of a very large right hip joint effusion containing gas.  Milder chronic left hip dislocation and fragmentation of the proximal left femur. Small left hip joint effusion without gas.  No evidence of abscess  within the anatomic pelvis.  Large stool burden in the rectum and sigmoid colon.  Remaining visualized colon unremarkable.  Visualized small bowel unremarkable.  Visualized lower poles of both kidneys unremarkable.  Retroaortic left renal vein.  Mild bilateral iliac atherosclerosis.  Suprapubic catheter within the decompressed urinary bladder.  IMPRESSION:  1.  Large right hip joint effusion with gas bubbles, consistent with an infected joint.  This is superimposed upon chronic osteomyelitis involving the proximal right  femur. 2.  Sacral decubitus ulcer with chronic destruction of the fourth and fifth sacral segments, unchanged in appearance since September, 2013. 3.  No evidence of abscess within the anatomic pelvis. 4.  Possible rectosigmoid fecal impaction.   Original Report Authenticated By: Hulan Saas, M.D.     Scheduled Meds: . baclofen  20 mg Oral QID  . docusate sodium  100 mg Oral BID  . famotidine  20 mg Oral BID  . ferrous sulfate  325 mg Oral TID WC  . furosemide  20 mg Oral Daily  . metoCLOPramide  10 mg Oral TID AC  . multivitamin with minerals  1 tablet Oral q morning - 10a  . piperacillin-tazobactam (ZOSYN)  IV  3.375 g Intravenous Q8H  . potassium chloride SA  20 mEq Oral q morning - 10a  . sodium chloride  3 mL Intravenous Q12H  . vancomycin  1,000 mg Intravenous Q12H  . vitamin C  500 mg Oral BID  . zinc sulfate  220 mg Oral q morning - 10a   Continuous Infusions: . sodium chloride 100 mL/hr at 08/03/12 1352    Active Problems:   Obesity   Quadriplegia   Hyponatremia   Leukocytosis   Sacral decubitus ulcer, stage IV   Complicated UTI (urinary tract infection)   Sepsis syndrome    Time spent: 35 minutes.    Breton Berns  [facility] Pager [phone]. If 7PM-7AM, please contact night-coverage at [email or web address], password [facility] 08/04/2012, 9:45 AM  LOS: 3 days

## 2012-08-04 NOTE — Procedures (Signed)
18G FNA of R hip under fluoro 5ml bloody fluid out, for lab studies No complication No blood loss. See complete dictation in [facility].

## 2012-08-05 DIAGNOSIS — M009 Pyogenic arthritis, unspecified: Secondary | ICD-10-CM

## 2012-08-05 DIAGNOSIS — L89109 Pressure ulcer of unspecified part of back, unspecified stage: Secondary | ICD-10-CM

## 2012-08-05 DIAGNOSIS — M869 Osteomyelitis, unspecified: Secondary | ICD-10-CM

## 2012-08-05 LAB — VANCOMYCIN, TROUGH: Vancomycin Tr: 12.8 ug/mL (ref 10.0–20.0)

## 2012-08-05 LAB — CBC
MCH: 23.8 pg — ABNORMAL LOW (ref 26.0–34.0)
MCHC: 30.8 g/dL (ref 30.0–36.0)
Platelets: 459 10*3/uL — ABNORMAL HIGH (ref 150–400)
RBC: 3.24 MIL/uL — ABNORMAL LOW (ref 4.22–5.81)
RDW: 17.7 % — ABNORMAL HIGH (ref 11.5–15.5)

## 2012-08-05 LAB — BASIC METABOLIC PANEL
CO2: 24 mEq/L (ref 19–32)
Calcium: 8.5 mg/dL (ref 8.4–10.5)
Creatinine, Ser: 0.5 mg/dL (ref 0.50–1.35)
GFR calc Af Amer: >90 mL/min (ref >90)
GFR calc non Af Amer: >90 mL/min (ref >90)
Sodium: 140 mEq/L (ref 135–145)

## 2012-08-05 NOTE — Progress Notes (Signed)
Assisted pt with nighttime CPAP, tolerating well at this time. RT will assist as needed.

## 2012-08-05 NOTE — Progress Notes (Addendum)
[facility] PROGRESS NOTE  Peggy Monk ZOX:[id number] DOB: [birth year] DOA: 08/01/2012 PCP: [name], MD  Assessment/Plan: 1. Sepsis: ? septic Joint.  Continue with Zosyn and Vancomycin  day 4 antibiotics. Vancomycin, because he has prior history of MRSA wound infection. Patient afebrile, BP stable. ID helping with care.  2. Decubitus ulcer stage iv. Pelvis Ct scan show chronic destruction of 4 and 5 th sacral segments. unchanged appearance since Sept 2013.  Wound care consulted. Saline dressing changes BID.  3.Diarrhea: C diff negative. Resolved.  4.Anemia: S/P  2 units PRBC transfusion 3-20. Hb baseline 7 to 8.  5.Leukocytosis: likely secondary to infection. Continue with IV antibiotics. WBC stable.  6.Propionibacterium blood culture 3/08. repeat blood culture 3-19 no growth to date. Current antibiotics regimen should cover.   7.Large right hip Joint effusion: concern with septic joint. Continue with IV antibiotics. Dr Daphine Deutscher (From [facility]) didn't feel that       patient need emergent surgery. Patient had  joint aspiration 3-22. I spoke with Dr Kelly Splinter 3-22 , she will be back in 1 week. There is no Engineer, petroleum to come to [place] long. She is sure that plastic surgery will be willing to see patient if patient is transfer to [facility]. I explain to her than medicine at [facility] refuse to take the patient because ortho didn't accept to do surgery on patient. Dr Kelly Splinter advised me to follow culture result if consistent with infectious to try to transfer patient again. Synovial fluid for Cell count was not send. Synovial fluid culture pending, Gram stain Pending. Dr Madelon Lips with orthopedic following.  8.Possible Rectosigmoid Fecal impaction by x ray: I  ask nurse again  to check for fecal impaction and disimpact.  9.Yeast urine.   Code Status: Full  Family Communication: Care discussed with patient.  Disposition Plan: how when  stable.   Consultants:  none  Procedures:  None.  Antibiotics:  Zosyn 3/2-  Vancomycin 3/20  HPI/Subjective: Feeling ok, no complaints.   Objective: Filed Vitals:   08/04/12 2113 08/04/12 2336 08/05/12 0651 08/05/12 0653  BP: 129/57  132/98 135/85  Pulse: 76 82 96 96  Temp: 97.7 F (36.5 C)  98.5 F (36.9 C)   TempSrc: Oral  Oral   Resp: 18 18 18    Height:      Weight:      SpO2: 98%  98% 98%    Intake/Output Summary (Last 24 hours) at 08/05/12 1130 Last data filed at 08/05/12 0911  Gross per 24 hour  Intake 3228.33 ml  Output   1300 ml  Net 1928.33 ml   Filed Weights   08/02/12 0312  Weight: 107.7 kg (237 lb 7 oz)    Exam:   General:  No distress.  Cardiovascular: S 1, S 2 RRR  Respiratory: CTA  Abdomen: Soft, colostomy with watery stool, no rigidity, no guarding.   Musculoskeletal: no edema. Quadriplegic.  Decubitus ulcer with dressing.   Data Reviewed: Basic Metabolic Panel:  Recent Labs Lab 08/01/12 2125 08/02/12 0635 08/03/12 0837 08/04/12 1110 08/05/12 0438  NA 133* 134* 135 138 140  K 3.8 3.6 3.3* 3.0* 3.2*  CL 96 100 102 104 106  CO2 22 22 23 24 24   GLUCOSE 111* 136* 100* 108* 135*  BUN 14 15 11 7 7   CREATININE 0.51 0.66 0.54 0.42* 0.50  CALCIUM 9.4 8.7 8.6 8.8 8.5   Liver Function Tests:  Recent Labs Lab 08/01/12 2125  AST 21  ALT 18  ALKPHOS 104  BILITOT 0.2*  PROT 8.7*  ALBUMIN 2.2*   No results found for this basename: LIPASE, AMYLASE,  in the last 168 hours No results found for this basename: AMMONIA,  in the last 168 hours CBC:  Recent Labs Lab 08/01/12 2125 08/02/12 0635 08/03/12 0837 08/04/12 1110 08/05/12 0438  WBC 17.4* 17.7* 12.8* 11.8* 12.4*  NEUTROABS 12.5*  --   --   --   --   HGB 7.9* 6.5* 7.1* 7.9* 7.7*  HCT 26.7* 21.3* 23.0* 26.2* 25.0*  MCV 73.6* 73.4* 75.2* 77.1* 77.2*  PLT 477* 402* 386 495* 459*   Cardiac Enzymes: No results found for this basename: CKTOTAL, CKMB, CKMBINDEX,  TROPONINI,  in the last 168 hours BNP (last 3 results) No results found for this basename: PROBNP,  in the last 8760 hours CBG: No results found for this basename: GLUCAP,  in the last 168 hours  Recent Results (from the past 240 hour(s))  CULTURE, BLOOD (ROUTINE X 2)     Status: None   Collection Time    08/01/12  9:25 PM      Result Value Range Status   Specimen Description BLOOD RIGHT THUMB   Final   Special Requests BOTTLES DRAWN AEROBIC AND ANAEROBIC 1CC   Final   Culture  Setup Time 08/02/2012 02:42   Final   Culture     Final   Value:        BLOOD CULTURE RECEIVED NO GROWTH TO DATE CULTURE WILL BE HELD FOR 5 DAYS BEFORE ISSUING A FINAL NEGATIVE REPORT   Report Status PENDING   Incomplete  URINE CULTURE     Status: None   Collection Time    08/01/12 10:30 PM      Result Value Range Status   Specimen Description URINE, CATHETERIZED   Final   Special Requests Normal   Final   Culture  Setup Time 08/02/2012 03:33   Final   Colony Count >=100,000 COLONIES/ML   Final   Culture YEAST   Final   Report Status 08/03/2012 FINAL   Final  CLOSTRIDIUM DIFFICILE BY PCR     Status: None   Collection Time    08/02/12  1:23 PM      Result Value Range Status   C difficile by pcr NEGATIVE  NEGATIVE Final  MRSA PCR SCREENING     Status: None   Collection Time    08/04/12 11:10 AM      Result Value Range Status   MRSA by PCR NEGATIVE  NEGATIVE Final   Comment:            The GeneXpert MRSA Assay (FDA     approved for NASAL specimens     only), is one component of a     comprehensive MRSA colonization     surveillance program. It is not     intended to diagnose MRSA     infection nor to guide or     monitor treatment for     MRSA infections.  CULTURE, ROUTINE-ABSCESS     Status: None   Collection Time    08/04/12  1:19 PM      Result Value Range Status   Specimen Description ABSCESS RIGHT HIP   Final   Special Requests NONE   Final   Gram Stain PENDING   Incomplete   Culture NO  GROWTH 1 DAY   Final   Report Status PENDING   Incomplete     Studies: Dg Fluoro Guide Ndl  Plc/bx  08/04/2012  *RADIOLOGY REPORT*   Clinical data: Enlarging right hip effusion  RIGHT HIP ASPIRATION UNDER FLUOROSCOPY  Technique: The procedure, risks (including but not limited to bleeding, infection, organ damage), benefits, and alternatives were explained to the patient.  Questions regarding the procedure were encouraged and answered.  The patient understands and consents to the procedure. An appropriate skin entry site was determined under fluoroscopy.  Site was marked, prepped with Betadine, draped in usual sterile fashion, infiltrated locally with 1% lidocaine. A 18 gauge spinal needle was advanced to the right hip joint. 4 ml bloody fluid were aspirated, sent for the requested laboratory studies.  The patient tolerated procedure well, with no immediate complication.  Fluoro time: 16 seconds  IMPRESSION: 1.  Technically successful right hip aspiration  under fluoroscopy   Original Report Authenticated By: D. Andria Rhein, MD     Scheduled Meds: . baclofen  20 mg Oral QID  . docusate sodium  100 mg Oral BID  . famotidine  20 mg Oral BID  . ferrous sulfate  325 mg Oral TID WC  . furosemide  20 mg Oral Daily  . metoCLOPramide  10 mg Oral TID AC  . multivitamin with minerals  1 tablet Oral q morning - 10a  . piperacillin-tazobactam (ZOSYN)  IV  3.375 g Intravenous Q8H  . potassium chloride SA  20 mEq Oral q morning - 10a  . sodium chloride  3 mL Intravenous Q12H  . vancomycin  1,000 mg Intravenous Q12H  . vitamin C  500 mg Oral BID  . zinc sulfate  220 mg Oral q morning - 10a   Continuous Infusions: . sodium chloride 100 mL/hr at 08/05/12 1610    Active Problems:   Obesity   Quadriplegia   Hyponatremia   Leukocytosis   Sacral decubitus ulcer, stage IV   Complicated UTI (urinary tract infection)   Sepsis syndrome    Time spent: 35 minutes.    Travers Goodley  [facility] Pager [phone]. If 7PM-7AM, please contact night-coverage at [email or web address], password [facility] 08/05/2012, 11:30 AM  LOS: 4 days   3/23 4 pm I called again to [facility] to transfer patient to have evaluation by Plastic surgery. I spoke again with Dr Leonides Sake with internal medicine. She is willing to accept [facility]. Dr Janee Morn with plastic surgery is willing to evaluate patient. They were asking me to contact [facility] tomorrow to arrange, and evaluate about transferrin the patient.
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
## 2012-08-06 DIAGNOSIS — Z9359 Other cystostomy status: Secondary | ICD-10-CM

## 2012-08-06 DIAGNOSIS — D649 Anemia, unspecified: Secondary | ICD-10-CM

## 2012-08-06 DIAGNOSIS — Z5189 Encounter for other specified aftercare: Secondary | ICD-10-CM

## 2012-08-06 DIAGNOSIS — M86659 Other chronic osteomyelitis, unspecified thigh: Secondary | ICD-10-CM | POA: Insufficient documentation

## 2012-08-06 DIAGNOSIS — G8250 Quadriplegia, unspecified: Secondary | ICD-10-CM

## 2012-08-06 LAB — BASIC METABOLIC PANEL
Chloride: 105 mEq/L (ref 96–112)
GFR calc Af Amer: >90 mL/min (ref >90)
GFR calc non Af Amer: >90 mL/min (ref >90)
Potassium: 3.1 mEq/L — ABNORMAL LOW (ref 3.5–5.1)
Sodium: 139 mEq/L (ref 135–145)

## 2012-08-06 LAB — TYPE AND SCREEN
Antibody Screen: NEGATIVE
Unit division: 0
Unit division: 0

## 2012-08-06 LAB — CBC
MCHC: 30.7 g/dL (ref 30.0–36.0)
Platelets: 520 10*3/uL — ABNORMAL HIGH (ref 150–400)
RDW: 18.1 % — ABNORMAL HIGH (ref 11.5–15.5)
WBC: 13.8 10*3/uL — ABNORMAL HIGH (ref 4.0–10.5)

## 2012-08-06 MED ORDER — SODIUM CHLORIDE 0.9 % IJ SOLN
10.0000 mL | INTRAMUSCULAR | Status: DC | PRN
  Administered 2012-08-06: 20 mL

## 2012-08-06 MED ORDER — JUVEN PO PACK
1.0000 | PACK | Freq: Four times a day (QID) | ORAL | Status: DC
  Administered 2012-08-06 (×2): 1 via ORAL
  Filled 2012-08-06 (×4): qty 1

## 2012-08-06 MED ORDER — POTASSIUM CHLORIDE CRYS ER 20 MEQ PO TBCR
40.0000 meq | EXTENDED_RELEASE_TABLET | Freq: Once | ORAL | Status: AC
  Administered 2012-08-06: 40 meq via ORAL
  Filled 2012-08-06: qty 2

## 2012-08-06 MED ORDER — ENSURE COMPLETE PO LIQD
237.0000 mL | Freq: Two times a day (BID) | ORAL | Status: DC
  Administered 2012-08-06 (×2): 237 mL via ORAL

## 2012-08-06 MED ORDER — POTASSIUM CHLORIDE CRYS ER 20 MEQ PO TBCR: 40.0000 meq | EXTENDED_RELEASE_TABLET | Freq: Once | ORAL | Status: DC

## 2012-08-06 MED ORDER — VANCOMYCIN HCL 10 G IV SOLR
1250.0000 mg | Freq: Two times a day (BID) | INTRAVENOUS | Status: DC
  Administered 2012-08-06: 1250 mg via INTRAVENOUS
  Filled 2012-08-06 (×2): qty 1250

## 2012-08-06 NOTE — Progress Notes (Signed)
Antibiotic Consult: Vanc, Zosyn Indication: sepsis, septic arthritis, osteomyelitis, sacral decubiti, UTI, and funguria  Please see pharmacy note by Wynonia Hazard on 3/23 for more details.  Vancomycin trough returned subtherapeutic at 12.8 (goal 15-20) on vancomycin 1gm IV q12h.  No new results on cultures collected.  Pt afebrile, WBC slowly trending up to 12.8K  Plan: Increase Vancomycin to 1250 mg IV q12h. Pharmacy will f/u  Geoffry Paradise, PharmD, BCPS Pager: [phone] 7:38 AM Pharmacy #: [phone]
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
## 2012-08-06 NOTE — Progress Notes (Signed)
Report called to Alvino Blood, RN at [facility] [place] [employer]. Pt going to room 856. [facility] called and transportation scheduled. Pt stable and made aware of timeline/plan. Pt agreeable to transfer. Will continue to monitor pt.

## 2012-08-06 NOTE — Plan of Care (Signed)
Problem: Phase I Progression Outcomes Goal: Voiding-avoid urinary catheter unless indicated Outcome: Not Applicable Date Met:  08/06/12 Pt has chronic suprapubic catheter

## 2012-08-07 DIAGNOSIS — E46 Unspecified protein-calorie malnutrition: Secondary | ICD-10-CM | POA: Diagnosis present

## 2012-08-07 DIAGNOSIS — Z87828 Personal history of other (healed) physical injury and trauma: Secondary | ICD-10-CM | POA: Insufficient documentation

## 2012-08-07 DIAGNOSIS — D649 Anemia, unspecified: Secondary | ICD-10-CM | POA: Insufficient documentation

## 2012-08-07 LAB — CULTURE, ROUTINE-ABSCESS: Culture: NO GROWTH

## 2012-08-08 LAB — CULTURE, BLOOD (ROUTINE X 2): Culture: NO GROWTH

## 2012-09-01 ENCOUNTER — Emergency Department (HOSPITAL_COMMUNITY): Payer: Medicare Other

## 2012-09-01 ENCOUNTER — Encounter (HOSPITAL_COMMUNITY): Payer: Self-pay | Admitting: Emergency Medicine

## 2012-09-01 ENCOUNTER — Inpatient Hospital Stay (HOSPITAL_COMMUNITY)
Admission: EM | Admit: 2012-09-01 | Discharge: 2012-09-07 | DRG: 853 | Disposition: A | Discharge status: Home / Self Care | Payer: Medicare Other | Attending: Family Medicine | Admitting: Family Medicine

## 2012-09-01 DIAGNOSIS — J45909 Unspecified asthma, uncomplicated: Secondary | ICD-10-CM | POA: Diagnosis present

## 2012-09-01 DIAGNOSIS — E662 Morbid (severe) obesity with alveolar hypoventilation: Secondary | ICD-10-CM | POA: Diagnosis present

## 2012-09-01 DIAGNOSIS — Z9989 Dependence on other enabling machines and devices: Secondary | ICD-10-CM

## 2012-09-01 DIAGNOSIS — L89309 Pressure ulcer of unspecified buttock, unspecified stage: Secondary | ICD-10-CM | POA: Diagnosis present

## 2012-09-01 DIAGNOSIS — G40909 Epilepsy, unspecified, not intractable, without status epilepticus: Secondary | ICD-10-CM

## 2012-09-01 DIAGNOSIS — Z95828 Presence of other vascular implants and grafts: Secondary | ICD-10-CM

## 2012-09-01 DIAGNOSIS — I739 Peripheral vascular disease, unspecified: Secondary | ICD-10-CM

## 2012-09-01 DIAGNOSIS — T80218A Other infection due to central venous catheter, initial encounter: Secondary | ICD-10-CM | POA: Diagnosis present

## 2012-09-01 DIAGNOSIS — L89154 Pressure ulcer of sacral region, stage 4: Secondary | ICD-10-CM

## 2012-09-01 DIAGNOSIS — N390 Urinary tract infection, site not specified: Secondary | ICD-10-CM

## 2012-09-01 DIAGNOSIS — A419 Sepsis, unspecified organism: Secondary | ICD-10-CM

## 2012-09-01 DIAGNOSIS — D72829 Elevated white blood cell count, unspecified: Secondary | ICD-10-CM

## 2012-09-01 DIAGNOSIS — L89109 Pressure ulcer of unspecified part of back, unspecified stage: Secondary | ICD-10-CM | POA: Diagnosis present

## 2012-09-01 DIAGNOSIS — IMO0002 Reserved for concepts with insufficient information to code with codable children: Secondary | ICD-10-CM

## 2012-09-01 DIAGNOSIS — T798XXD Other early complications of trauma, subsequent encounter: Secondary | ICD-10-CM

## 2012-09-01 DIAGNOSIS — B957 Other staphylococcus as the cause of diseases classified elsewhere: Secondary | ICD-10-CM

## 2012-09-01 DIAGNOSIS — E669 Obesity, unspecified: Secondary | ICD-10-CM | POA: Diagnosis present

## 2012-09-01 DIAGNOSIS — I10 Essential (primary) hypertension: Secondary | ICD-10-CM

## 2012-09-01 DIAGNOSIS — Z933 Colostomy status: Secondary | ICD-10-CM

## 2012-09-01 DIAGNOSIS — Z6831 Body mass index (BMI) 31.0-31.9, adult: Secondary | ICD-10-CM

## 2012-09-01 DIAGNOSIS — A4902 Methicillin resistant Staphylococcus aureus infection, unspecified site: Secondary | ICD-10-CM

## 2012-09-01 DIAGNOSIS — R6520 Severe sepsis without septic shock: Secondary | ICD-10-CM

## 2012-09-01 DIAGNOSIS — B49 Unspecified mycosis: Secondary | ICD-10-CM

## 2012-09-01 DIAGNOSIS — M009 Pyogenic arthritis, unspecified: Secondary | ICD-10-CM

## 2012-09-01 DIAGNOSIS — G4733 Obstructive sleep apnea (adult) (pediatric): Secondary | ICD-10-CM

## 2012-09-01 DIAGNOSIS — L8994 Pressure ulcer of unspecified site, stage 4: Secondary | ICD-10-CM | POA: Diagnosis present

## 2012-09-01 DIAGNOSIS — M8668 Other chronic osteomyelitis, other site: Secondary | ICD-10-CM | POA: Diagnosis present

## 2012-09-01 DIAGNOSIS — B377 Candidal sepsis: Secondary | ICD-10-CM

## 2012-09-01 DIAGNOSIS — E871 Hypo-osmolality and hyponatremia: Secondary | ICD-10-CM

## 2012-09-01 DIAGNOSIS — G8250 Quadriplegia, unspecified: Secondary | ICD-10-CM

## 2012-09-01 DIAGNOSIS — D509 Iron deficiency anemia, unspecified: Secondary | ICD-10-CM

## 2012-09-01 DIAGNOSIS — M869 Osteomyelitis, unspecified: Secondary | ICD-10-CM | POA: Diagnosis present

## 2012-09-01 DIAGNOSIS — J9610 Chronic respiratory failure, unspecified whether with hypoxia or hypercapnia: Secondary | ICD-10-CM | POA: Diagnosis present

## 2012-09-01 DIAGNOSIS — A498 Other bacterial infections of unspecified site: Secondary | ICD-10-CM

## 2012-09-01 DIAGNOSIS — J189 Pneumonia, unspecified organism: Secondary | ICD-10-CM

## 2012-09-01 DIAGNOSIS — L8993 Pressure ulcer of unspecified site, stage 3: Secondary | ICD-10-CM

## 2012-09-01 DIAGNOSIS — D638 Anemia in other chronic diseases classified elsewhere: Secondary | ICD-10-CM | POA: Diagnosis present

## 2012-09-01 DIAGNOSIS — E860 Dehydration: Secondary | ICD-10-CM

## 2012-09-01 DIAGNOSIS — Z79899 Other long term (current) drug therapy: Secondary | ICD-10-CM

## 2012-09-01 DIAGNOSIS — B3789 Other sites of candidiasis: Secondary | ICD-10-CM | POA: Diagnosis present

## 2012-09-01 DIAGNOSIS — A409 Streptococcal sepsis, unspecified: Principal | ICD-10-CM | POA: Diagnosis present

## 2012-09-01 DIAGNOSIS — Y849 Medical procedure, unspecified as the cause of abnormal reaction of the patient, or of later complication, without mention of misadventure at the time of the procedure: Secondary | ICD-10-CM | POA: Diagnosis present

## 2012-09-01 DIAGNOSIS — Z9359 Other cystostomy status: Secondary | ICD-10-CM

## 2012-09-01 DIAGNOSIS — R61 Generalized hyperhidrosis: Secondary | ICD-10-CM

## 2012-09-01 LAB — COMPREHENSIVE METABOLIC PANEL
AST: 29 U/L (ref 0–37)
Albumin: 2.8 g/dL — ABNORMAL LOW (ref 3.5–5.2)
Alkaline Phosphatase: 106 U/L (ref 39–117)
BUN: 37 mg/dL — ABNORMAL HIGH (ref 6–23)
CO2: 22 mEq/L (ref 19–32)
Chloride: 109 mEq/L (ref 96–112)
Creatinine, Ser: 1.12 mg/dL (ref 0.50–1.35)
GFR calc non Af Amer: 77 mL/min — ABNORMAL LOW (ref >90)
Potassium: 5.1 mEq/L (ref 3.5–5.1)
Total Bilirubin: 0.1 mg/dL — ABNORMAL LOW (ref 0.3–1.2)

## 2012-09-01 LAB — CBC WITH DIFFERENTIAL/PLATELET
Basophils Absolute: 0.1 10*3/uL (ref 0.0–0.1)
Basophils Relative: 1 % (ref 0–1)
HCT: 27.8 % — ABNORMAL LOW (ref 39.0–52.0)
Hemoglobin: 8.5 g/dL — ABNORMAL LOW (ref 13.0–17.0)
Lymphocytes Relative: 13 % (ref 12–46)
MCHC: 30.6 g/dL (ref 30.0–36.0)
Monocytes Absolute: 2.1 10*3/uL — ABNORMAL HIGH (ref 0.1–1.0)
Monocytes Relative: 11 % (ref 3–12)
Neutro Abs: 13.2 10*3/uL — ABNORMAL HIGH (ref 1.7–7.7)
Neutrophils Relative %: 72 % (ref 43–77)
WBC: 18.4 10*3/uL — ABNORMAL HIGH (ref 4.0–10.5)

## 2012-09-01 LAB — URINE MICROSCOPIC-ADD ON

## 2012-09-01 LAB — URINALYSIS, ROUTINE W REFLEX MICROSCOPIC
Glucose, UA: NEGATIVE mg/dL
Hgb urine dipstick: NEGATIVE
Ketones, ur: NEGATIVE mg/dL
Protein, ur: 100 mg/dL — AB
Urobilinogen, UA: 0.2 mg/dL (ref 0.0–1.0)

## 2012-09-01 MED ORDER — ONDANSETRON HCL 4 MG PO TABS: 4.0000 mg | ORAL_TABLET | Freq: Four times a day (QID) | ORAL | Status: DC | PRN

## 2012-09-01 MED ORDER — AMLODIPINE BESYLATE 10 MG PO TABS
10.0000 mg | ORAL_TABLET | Freq: Every day | ORAL | Status: DC
  Administered 2012-09-01 – 2012-09-07 (×7): 10 mg via ORAL
  Filled 2012-09-01 (×7): qty 1

## 2012-09-01 MED ORDER — DOCUSATE SODIUM 100 MG PO CAPS
100.0000 mg | ORAL_CAPSULE | Freq: Two times a day (BID) | ORAL | Status: DC
  Administered 2012-09-01 – 2012-09-07 (×12): 100 mg via ORAL
  Filled 2012-09-01 (×14): qty 1

## 2012-09-01 MED ORDER — SODIUM CHLORIDE 0.9 % IJ SOLN
10.0000 mL | INTRAMUSCULAR | Status: DC | PRN
  Administered 2012-09-01 – 2012-09-07 (×8): 10 mL

## 2012-09-01 MED ORDER — FERROUS SULFATE 325 (65 FE) MG PO TABS
325.0000 mg | ORAL_TABLET | Freq: Three times a day (TID) | ORAL | Status: DC
  Administered 2012-09-02 – 2012-09-07 (×16): 325 mg via ORAL
  Filled 2012-09-01 (×20): qty 1

## 2012-09-01 MED ORDER — ALBUTEROL SULFATE (5 MG/ML) 0.5% IN NEBU: 2.5000 mg | INHALATION_SOLUTION | RESPIRATORY_TRACT | Status: DC | PRN

## 2012-09-01 MED ORDER — ADULT MULTIVITAMIN W/MINERALS CH
1.0000 | ORAL_TABLET | Freq: Every morning | ORAL | Status: DC
  Administered 2012-09-02 – 2012-09-07 (×6): 1 via ORAL
  Filled 2012-09-01 (×6): qty 1

## 2012-09-01 MED ORDER — FUROSEMIDE 20 MG PO TABS
20.0000 mg | ORAL_TABLET | Freq: Every day | ORAL | Status: DC
  Administered 2012-09-01 – 2012-09-07 (×7): 20 mg via ORAL
  Filled 2012-09-01 (×7): qty 1

## 2012-09-01 MED ORDER — SODIUM CHLORIDE 0.9 % IJ SOLN
3.0000 mL | Freq: Two times a day (BID) | INTRAMUSCULAR | Status: DC
  Administered 2012-09-01 – 2012-09-04 (×5): 3 mL via INTRAVENOUS

## 2012-09-01 MED ORDER — ZINC SULFATE 220 (50 ZN) MG PO CAPS
220.0000 mg | ORAL_CAPSULE | Freq: Every morning | ORAL | Status: DC
  Administered 2012-09-02 – 2012-09-07 (×6): 220 mg via ORAL
  Filled 2012-09-01 (×7): qty 1

## 2012-09-01 MED ORDER — FAMOTIDINE 20 MG PO TABS
20.0000 mg | ORAL_TABLET | Freq: Two times a day (BID) | ORAL | Status: DC
  Administered 2012-09-01 – 2012-09-07 (×12): 20 mg via ORAL
  Filled 2012-09-01 (×13): qty 1

## 2012-09-01 MED ORDER — VANCOMYCIN HCL 10 G IV SOLR
2000.0000 mg | Freq: Once | INTRAVENOUS | Status: AC
  Administered 2012-09-01: 2000 mg via INTRAVENOUS
  Filled 2012-09-01: qty 2000

## 2012-09-01 MED ORDER — FLUCONAZOLE 200 MG PO TABS
600.0000 mg | ORAL_TABLET | Freq: Every day | ORAL | Status: DC
  Administered 2012-09-01 – 2012-09-07 (×7): 600 mg via ORAL
  Filled 2012-09-01 (×8): qty 3

## 2012-09-01 MED ORDER — ONDANSETRON HCL 4 MG/2ML IJ SOLN: 4.0000 mg | Freq: Four times a day (QID) | INTRAMUSCULAR | Status: DC | PRN

## 2012-09-01 MED ORDER — PIPERACILLIN-TAZOBACTAM 3.375 G IVPB 30 MIN
3.3750 g | Freq: Three times a day (TID) | INTRAVENOUS | Status: DC
  Administered 2012-09-01 – 2012-09-05 (×11): 3.375 g via INTRAVENOUS
  Filled 2012-09-01 (×12): qty 50

## 2012-09-01 MED ORDER — ENOXAPARIN SODIUM 40 MG/0.4ML ~~LOC~~ SOLN
40.0000 mg | Freq: Every day | SUBCUTANEOUS | Status: DC
  Administered 2012-09-01 – 2012-09-06 (×6): 40 mg via SUBCUTANEOUS
  Filled 2012-09-01 (×7): qty 0.4

## 2012-09-01 MED ORDER — VITAMIN C 500 MG PO TABS
500.0000 mg | ORAL_TABLET | Freq: Two times a day (BID) | ORAL | Status: DC
  Administered 2012-09-01 – 2012-09-07 (×12): 500 mg via ORAL
  Filled 2012-09-01 (×13): qty 1

## 2012-09-01 MED ORDER — POTASSIUM CHLORIDE CRYS ER 20 MEQ PO TBCR
20.0000 meq | EXTENDED_RELEASE_TABLET | Freq: Every morning | ORAL | Status: DC
  Administered 2012-09-02 – 2012-09-07 (×6): 20 meq via ORAL
  Filled 2012-09-01 (×6): qty 1

## 2012-09-01 MED ORDER — METOCLOPRAMIDE HCL 10 MG PO TABS
10.0000 mg | ORAL_TABLET | Freq: Three times a day (TID) | ORAL | Status: DC
  Administered 2012-09-02 – 2012-09-07 (×16): 10 mg via ORAL
  Filled 2012-09-01 (×21): qty 1

## 2012-09-01 MED ORDER — SODIUM CHLORIDE 0.9 % IV SOLN
INTRAVENOUS | Status: DC
  Administered 2012-09-01: 22:00:00 via INTRAVENOUS

## 2012-09-01 MED ORDER — BACLOFEN 20 MG PO TABS
20.0000 mg | ORAL_TABLET | Freq: Four times a day (QID) | ORAL | Status: DC
  Administered 2012-09-01 – 2012-09-07 (×22): 20 mg via ORAL
  Filled 2012-09-01 (×25): qty 1

## 2012-09-01 MED ORDER — SODIUM CHLORIDE 0.9 % IV BOLUS (SEPSIS)
2000.0000 mL | Freq: Once | INTRAVENOUS | Status: AC
  Administered 2012-09-01: 1000 mL via INTRAVENOUS
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
## 2012-09-01 NOTE — ED Notes (Addendum)
Per EMS: Pt is from home and a quadriplegic. Pt states his PICC became dislodged during a gown change this morning. Pt had PICC line for antibiotic administration. EMS reports puss at the PICC insertion site. EMS also reports that the patient is cool and diaphoretic.

## 2012-09-01 NOTE — ED Notes (Signed)
AVW:[id number]<XB> Expected date:09/01/12<BR> Expected time: 2:10 PM<BR> Means of arrival:Ambulance<BR> Comments:<BR> PICC line out
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
## 2012-09-01 NOTE — ED Notes (Signed)
Notified RN via radio and MD vis phone of Lactic Acid elevation

## 2012-09-01 NOTE — ED Notes (Signed)
Spoke with Riki Rusk, Rn and advised of additional cultures needed.  He requested that we contact main lab as initial drawn had to be done by IV team and upon observation no visible veins.  Called lab and they are sending someone to draw

## 2012-09-01 NOTE — Procedures (Signed)
Interventional Radiology Procedure Note  Procedure: Placement of Left brachial 52 cm dual lumen PowerPICC.  Tip in distal SVC and ready for use. Complications: None Recommendations: - Routine line care  Signed,  Sterling Big, MD Vascular & Interventional Radiologist [facility] Radiology

## 2012-09-02 DIAGNOSIS — L8994 Pressure ulcer of unspecified site, stage 4: Secondary | ICD-10-CM

## 2012-09-02 DIAGNOSIS — L89109 Pressure ulcer of unspecified part of back, unspecified stage: Secondary | ICD-10-CM

## 2012-09-02 DIAGNOSIS — I10 Essential (primary) hypertension: Secondary | ICD-10-CM

## 2012-09-02 DIAGNOSIS — M009 Pyogenic arthritis, unspecified: Secondary | ICD-10-CM

## 2012-09-02 DIAGNOSIS — M8668 Other chronic osteomyelitis, other site: Secondary | ICD-10-CM

## 2012-09-02 DIAGNOSIS — B3789 Other sites of candidiasis: Secondary | ICD-10-CM

## 2012-09-02 DIAGNOSIS — G8250 Quadriplegia, unspecified: Secondary | ICD-10-CM

## 2012-09-02 LAB — VANCOMYCIN, TROUGH: Vancomycin Tr: 18 ug/mL (ref 10.0–20.0)

## 2012-09-02 LAB — MRSA PCR SCREENING: MRSA by PCR: NEGATIVE

## 2012-09-02 MED ORDER — VANCOMYCIN HCL IN DEXTROSE 1-5 GM/200ML-% IV SOLN
1000.0000 mg | Freq: Two times a day (BID) | INTRAVENOUS | Status: DC
  Filled 2012-09-02: qty 200

## 2012-09-02 MED ORDER — LORAZEPAM 2 MG/ML IJ SOLN: 1.0000 mg | Freq: Once | INTRAMUSCULAR | Status: DC

## 2012-09-02 MED ORDER — VANCOMYCIN HCL IN DEXTROSE 1-5 GM/200ML-% IV SOLN
1000.0000 mg | Freq: Two times a day (BID) | INTRAVENOUS | Status: DC
  Administered 2012-09-02 – 2012-09-05 (×6): 1000 mg via INTRAVENOUS
  Filled 2012-09-02 (×6): qty 200
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
## 2012-09-02 NOTE — Progress Notes (Signed)
ANTIBIOTIC CONSULT NOTE - FOLLOW UP  Pharmacy Consult for Vancomycin Indication: Right hip septic arthritis/Pelvic Osteomyelitis  No Known Allergies  Patient Measurements: Height: 6' (182.9 cm) Weight: 228 lb 9.9 oz (103.7 kg) IBW/kg (Calculated) : 77.6  Vital Signs: Temp: 98.5 F (36.9 C) (04/20 0612) Temp src: Oral (04/20 0612) BP: 106/74 mmHg (04/20 0612) Pulse Rate: 109 (04/20 0612) Intake/Output from previous day: 04/19 0701 - 04/20 0700 In: -  Out: 1325 [Urine:1325]  Labs:  Recent Labs  09/01/12 1550  WBC 18.4*  HGB 8.5*  PLT 618*  CREATININE 1.12   Estimated Creatinine Clearance: 102.6 ml/min (by C-G formula based on Cr of 1.12).  Recent Labs  09/02/12 1015  VANCOTROUGH 18.0      Anti-infectives   Start     Dose/Rate Route Frequency Ordered Stop   09/02/12 1000  vancomycin (VANCOCIN) IVPB 1000 mg/200 mL premix  Status:  Discontinued     1,000 mg 200 mL/hr over 60 Minutes Intravenous Every 12 hours 09/02/12 0053 09/02/12 0832   09/01/12 2200  piperacillin-tazobactam (ZOSYN) IVPB 3.375 g     3.375 g 12.5 mL/hr over 240 Minutes Intravenous 3 times per day 09/01/12 2121     09/01/12 2145  vancomycin (VANCOCIN) 2,000 mg in sodium chloride 0.9 % 500 mL IVPB     2,000 mg 250 mL/hr over 120 Minutes Intravenous  Once 09/01/12 2142 09/02/12 0030   09/01/12 2130  fluconazole (DIFLUCAN) tablet 600 mg     600 mg Oral Daily 09/01/12 2121        Assessment: 47 yo obese, quadriplegic male.  Admit 4/19 with several sites of infection including:  Right hip septic arthritis/Pelvic Osteomyelitis, PICC line, chronic foley.  Extensive and complex HPI:    Microbial results:   Hx of MRSA in decubitus ulcer 05/04/2012, Hx of Psuedomonas/enterococcus osteomyelitis 05/14/2012, Hx of Klebsiella and Enterobacter UTI 07/10/12, Yeast UTI 07/21/12  Hospitalization at [facility] 3/19 and treated with Vanc and Zosyn.  He was transferred to [facility] 3/24 for a plastic surgery  consultation.  At [facility], he was initially continued on Zosyn and Vanc.  No surgery was indicated, but according to Care Everywhere notes, he had an IR guided aspiration, which showed coag-negative staph and Candida parapsilosis.  For this he was discharged on Fluconazole 600mg  PO daily and Ertapenem 1g IV q24h for 6-week course of treatment (through 5/23).  SCr is wnl with CrCl ~  85 ml/min (note in quadriplegic pt CrCl may be overestimated)  WBC (18.4) is elevated  Blood and Urine cultures pending (4/19)  After admission, the pt received one dose of Vancomycin 2g IV on 4/19 PM with therapeutic vancomycin trough level (18) about 12 hours later.   Goal of Therapy:  Vancomycin trough level 15-20 mcg/ml  Plan:   This patient is actively on Vancomycin however due to therapeutic level, doses will be re-entered when appropriate.  Recheck random vancomycin level 4/21 and re-dose as needed.  Follow up renal fxn and culture results.    Lynann [name] PharmD, BCPS Pager [phone] 09/02/2012 1:26 PM
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
## 2012-09-02 NOTE — Progress Notes (Signed)
PATIENT DETAILS Name: [name] [name] Age: 47 y.o. Sex: male Date of Birth: Dec 01, 1965 Admit Date: 09/01/2012 Admitting Physician Houston Siren, MD WUJ:[id number],WGNFA N, MD  Subjective: Admitted with Low grade fever/diaphoresis .Recently transferred to [facility] Discharged from [facility] last week on IV Invanz and oral fluconazole for Right hip septic arthritis/Pelvic Osteomyelitis  Assessment/Plan: Active Problems: Sepsis syndrome -complicated patient -recent h/o right septic hip joint, chronic pelvic osteo and sacral decubitus-getting IV Invanz and oral fluconazole-prior to hospitalization-before PICC line dislodged and came off -Multiple sites of infection-PICC line, chronic foley, sacral wounds, right hip-at this time-would empirically continue with IV Vanco/Zosyn/Fluconazole-await blood and urine cultures fron on 4/19. Since just recently had imaging done-would hold off on repeating it at this time-but will get ID eval. Will inform Dr Leonie Green service of patients admission -per my review of discharge summary from [facility] in Epic-Arthrocentesis under IR guidance done at [facility] was positive for Coag neg staph and Candida Parapsilosis.Patient was discharfged on 4/11-with oral fluconazole and IV Invanz to complete 6 weeks of therapy  Recent Right hip septic Arthritis/Pelvic Osteo/Sacral Decub stage 4 -all present prior to this admit -as above -wound care -consult primary Plastics-Dr Sanger -on Zinc Sulfate and Vit C to promote wound healing  Anemia -presumed 2/2 chronic disease -monitor and transfuse prn  HTN -controlled  -c/w Amlodipine/Lasix  Quad/Chronic Suprapubic catheter/Colostomy -stable    Disposition: Remain inpatient  DVT Prophylaxis: Prophylactic Lovenox   Code Status: Full code   Procedures:  None  CONSULTS:  ID  PHYSICAL EXAM: Vital signs in last 24 hours: Filed Vitals:   09/01/12 1814 09/01/12 1900 09/01/12 2105 09/02/12 0612  BP: 156/108 169/97  116/74 106/74  Pulse: 106 118 121 109  Temp:  98.6 F (37 C) 99 F (37.2 C) 98.5 F (36.9 C)  TempSrc:  Rectal Oral Oral  Resp: 16 16 20 18   Height:   6' (1.829 m)   Weight:   103.7 kg (228 lb 9.9 oz)   SpO2: 100% 98% 100% 100%    Weight change:  Filed Weights   09/01/12 2105  Weight: 103.7 kg (228 lb 9.9 oz)   Body mass index is 31 kg/(m^2).   Gen Exam: Awake and alert with clear speech.   Neck: Supple, No JVD.   Chest: B/L Clear.   CVS: S1 S2 Regular, no murmurs.  Abdomen: soft, BS +, non tender, non distended.  Extremities: no edema, lower extremities warm to touch.Atrophied B/L lower ext Neurologic: Quadriplegic Skin: No Rash.   Wounds:Stage 4 sacral decubitus  Intake/Output from previous day:  Intake/Output Summary (Last 24 hours) at 09/02/12 0928 Last data filed at 09/02/12 0837  Gross per 24 hour  Intake    400 ml  Output   1325 ml  Net   -925 ml     LAB RESULTS: CBC  Recent Labs Lab 09/01/12 1550  WBC 18.4*  HGB 8.5*  HCT 27.8*  PLT 618*  MCV 77.7*  MCH 23.7*  MCHC 30.6  RDW 19.4*  LYMPHSABS 2.4  MONOABS 2.1*  EOSABS 0.7  BASOSABS 0.1    Chemistries   Recent Labs Lab 09/01/12 1550  NA 143  K 5.1  CL 109  CO2 22  GLUCOSE 142*  BUN 37*  CREATININE 1.12  CALCIUM 10.1    CBG: No results found for this basename: GLUCAP,  in the last 168 hours  GFR Estimated Creatinine Clearance: 102.6 ml/min (by C-G formula based on Cr of 1.12).  Coagulation profile No results found  for this basename: INR, PROTIME,  in the last 168 hours  Cardiac Enzymes No results found for this basename: CK, CKMB, TROPONINI, MYOGLOBIN,  in the last 168 hours  No components found with this basename: POCBNP,  No results found for this basename: DDIMER,  in the last 72 hours No results found for this basename: HGBA1C,  in the last 72 hours No results found for this basename: CHOL, HDL, LDLCALC, TRIG, CHOLHDL, LDLDIRECT,  in the last 72 hours No results  found for this basename: TSH, T4TOTAL, FREET3, T3FREE, THYROIDAB,  in the last 72 hours No results found for this basename: VITAMINB12, FOLATE, FERRITIN, TIBC, IRON, RETICCTPCT,  in the last 72 hours No results found for this basename: LIPASE, AMYLASE,  in the last 72 hours  Urine Studies No results found for this basename: UACOL, UAPR, USPG, UPH, UTP, UGL, UKET, UBIL, UHGB, UNIT, UROB, ULEU, UEPI, UWBC, URBC, UBAC, CAST, CRYS, UCOM, BILUA,  in the last 72 hours  MICROBIOLOGY: Recent Results (from the past 240 hour(s))  MRSA PCR SCREENING     Status: None   Collection Time    09/01/12 10:39 PM      Result Value Range Status   MRSA by PCR NEGATIVE  NEGATIVE Final   Comment:            The GeneXpert MRSA Assay (FDA     approved for NASAL specimens     only), is one component of a     comprehensive MRSA colonization     surveillance program. It is not     intended to diagnose MRSA     infection nor to guide or     monitor treatment for     MRSA infections.    RADIOLOGY STUDIES/RESULTS: Dg Chest Port 1 View  09/01/2012  *RADIOLOGY REPORT*  Clinical Data: Fever, sweating.  PORTABLE CHEST - 1 VIEW  Comparison: 08/01/2012  Findings: Cardiomegaly.  Chronic areas of scarring in the lungs bilaterally.  No acute airspace opacities or effusions.  No acute bony abnormality.  IMPRESSION: Stable chronic changes and cardiomegaly.  No acute findings.   Original Report Authenticated By: Charlett Nose, M.D.    Dg Fluoro Guide Ndl Plc/bx  08/04/2012  *RADIOLOGY REPORT*   Clinical data: Enlarging right hip effusion  RIGHT HIP ASPIRATION UNDER FLUOROSCOPY  Technique: The procedure, risks (including but not limited to bleeding, infection, organ damage), benefits, and alternatives were explained to the patient.  Questions regarding the procedure were encouraged and answered.  The patient understands and consents to the procedure. An appropriate skin entry site was determined under fluoroscopy.  Site was marked,  prepped with Betadine, draped in usual sterile fashion, infiltrated locally with 1% lidocaine. A 18 gauge spinal needle was advanced to the right hip joint. 4 ml bloody fluid were aspirated, sent for the requested laboratory studies.  The patient tolerated procedure well, with no immediate complication.  Fluoro time: 16 seconds  IMPRESSION: 1.  Technically successful right hip aspiration  under fluoroscopy   Original Report Authenticated By: D. Andria Rhein, MD     MEDICATIONS: Scheduled Meds: . amLODipine  10 mg Oral Daily  . baclofen  20 mg Oral QID  . docusate sodium  100 mg Oral BID  . enoxaparin (LOVENOX) injection  40 mg Subcutaneous QHS  . famotidine  20 mg Oral BID  . ferrous sulfate  325 mg Oral TID WC  . fluconazole  600 mg Oral Daily  . furosemide  20 mg Oral  Daily  . metoCLOPramide  10 mg Oral TID AC  . multivitamin with minerals  1 tablet Oral q morning - 10a  . piperacillin-tazobactam  3.375 g Intravenous Q8H  . potassium chloride SA  20 mEq Oral q morning - 10a  . sodium chloride  3 mL Intravenous Q12H  . vitamin C  500 mg Oral BID  . zinc sulfate  220 mg Oral q morning - 10a   Continuous Infusions: . sodium chloride 50 mL/hr at 09/01/12 2221   PRN Meds:.albuterol, ondansetron (ZOFRAN) IV, ondansetron, sodium chloride  Antibiotics: Anti-infectives   Start     Dose/Rate Route Frequency Ordered Stop   09/02/12 1000  vancomycin (VANCOCIN) IVPB 1000 mg/200 mL premix  Status:  Discontinued     1,000 mg 200 mL/hr over 60 Minutes Intravenous Every 12 hours 09/02/12 0053 09/02/12 0832   09/01/12 2200  piperacillin-tazobactam (ZOSYN) IVPB 3.375 g     3.375 g 12.5 mL/hr over 240 Minutes Intravenous 3 times per day 09/01/12 2121     09/01/12 2145  vancomycin (VANCOCIN) 2,000 mg in sodium chloride 0.9 % 500 mL IVPB     2,000 mg 250 mL/hr over 120 Minutes Intravenous  Once 09/01/12 2142 09/02/12 0030   09/01/12 2130  fluconazole (DIFLUCAN) tablet 600 mg     600 mg Oral Daily  09/01/12 2121         Jeoffrey Massed, MD  [facility] Pager:[phone]  If 7PM-7AM, please contact night-coverage [email or web address] Password [email or web address] 09/02/2012, 9:28 AM   LOS: 1 day

## 2012-09-03 ENCOUNTER — Encounter: Payer: Self-pay | Admitting: *Deleted

## 2012-09-03 ENCOUNTER — Encounter (HOSPITAL_COMMUNITY): Payer: Self-pay | Admitting: Physician Assistant

## 2012-09-03 DIAGNOSIS — R61 Generalized hyperhidrosis: Secondary | ICD-10-CM

## 2012-09-03 LAB — BASIC METABOLIC PANEL
CO2: 21 mEq/L (ref 19–32)
Calcium: 9.7 mg/dL (ref 8.4–10.5)
GFR calc non Af Amer: >90 mL/min (ref >90)
Glucose, Bld: 139 mg/dL — ABNORMAL HIGH (ref 70–99)
Potassium: 4.4 mEq/L (ref 3.5–5.1)
Sodium: 142 mEq/L (ref 135–145)

## 2012-09-03 LAB — CBC
Hemoglobin: 8.1 g/dL — ABNORMAL LOW (ref 13.0–17.0)
MCH: 23.8 pg — ABNORMAL LOW (ref 26.0–34.0)
Platelets: 488 10*3/uL — ABNORMAL HIGH (ref 150–400)
RBC: 3.41 MIL/uL — ABNORMAL LOW (ref 4.22–5.81)

## 2012-09-03 LAB — GLUCOSE, CAPILLARY: Glucose-Capillary: 176 mg/dL — ABNORMAL HIGH (ref 70–99)

## 2012-09-03 MED ORDER — BOOST PLUS PO LIQD
237.0000 mL | Freq: Two times a day (BID) | ORAL | Status: DC
  Administered 2012-09-03 – 2012-09-07 (×6): 237 mL via ORAL
  Filled 2012-09-03 (×9): qty 237

## 2012-09-03 MED ORDER — JUVEN PO PACK
1.0000 | PACK | Freq: Two times a day (BID) | ORAL | Status: DC
  Administered 2012-09-03 – 2012-09-07 (×6): 1 via ORAL
  Filled 2012-09-03 (×9): qty 1

## 2012-09-03 MED ORDER — VITAMINS A & D EX OINT
TOPICAL_OINTMENT | CUTANEOUS | Status: AC
  Administered 2012-09-03: 5 via TOPICAL
  Filled 2012-09-03: qty 5

## 2012-09-03 MED ORDER — PRO-STAT SUGAR FREE PO LIQD
30.0000 mL | Freq: Two times a day (BID) | ORAL | Status: DC
  Administered 2012-09-03 – 2012-09-07 (×6): 30 mL via ORAL
  Filled 2012-09-03 (×10): qty 30
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
## 2012-09-03 NOTE — Progress Notes (Signed)
[facility]  Patient Status: Active (receiving services up to time of hospitalization)  [facility] is providing the following services: RN and Home Infusion Services (teaching and education will be done by nurse in the home with patient and caregiver)  If patient discharges after hours, please call [phone].   [name] [name] 09/03/2012, 11:13 AM

## 2012-09-03 NOTE — Progress Notes (Signed)
Patient is currently active with long-term disease management services with [facility] Management [facility]. Patient also active with [facility]. [facility] Care Management will continue to follow post discharge.   [name], MSN-Ed,, RN,BSN, [facility], [phone]
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
## 2012-09-03 NOTE — Progress Notes (Signed)
PATIENT DETAILS Name: [name] [name] Age: 47 y.o. Sex: male Date of Birth: 01/13/1966 Admit Date: 09/01/2012 Admitting Physician Houston Siren, MD JXB:[email or web address],AOZHY N, MD  Subjective: No major complaints overnight  Assessment/Plan: Active Problems: Sepsis syndrome -complicated patient-recent h/o right septic hip joint, chronic pelvic osteo and sacral decubitus-getting IV Invanz and oral fluconazole-prior to hospitalization-before PICC line dislodged and came off. Per my review of discharge summary from [facility] in Epic-Arthrocentesis under IR guidance done at [facility] was positive for Coag neg staph and Candida Parapsilosis.Patient was discharfged on 4/11-with oral fluconazole and IV Invanz to complete 6 weeks of therapy -Multiple sites of infection-PICC line, chronic foley, sacral wounds, right hip-at this time. But afebrile overnight and decreasing leukocytosis -c/w IV Vanco/Zosyn/Fluconazole-appreciate ID eval - blood and urine cultures  4/19-neg so far -. Since just recently had imaging done-would hold off on repeating it at this time- -Plastics to see today-spoke with Dr Leonie Green on 4/20   Recent Right hip septic Arthritis/Pelvic Osteo/Sacral Decub stage 4 -all present prior to this admit -as above -wound care eval -consult primary Plastics-Dr Sanger -on Zinc Sulfate and Vit C to promote wound healing  Anemia -presumed 2/2 chronic disease -monitor and transfuse prn -c/w Ferrous Sulfate  HTN -controlled  -c/w Amlodipine/Lasix  Quad/Chronic Suprapubic catheter/Colostomy -stable    Disposition: Remain inpatient  DVT Prophylaxis: Prophylactic Lovenox   Code Status: Full code   Procedures:  None  CONSULTS:  ID  PHYSICAL EXAM: Vital signs in last 24 hours: Filed Vitals:   09/02/12 0612 09/02/12 1445 09/02/12 2036 09/03/12 0504  BP: 106/74 118/71 111/71 92/63  Pulse: 109 117 111 118  Temp: 98.5 F (36.9 C) 98.1 F (36.7 C) 98.4 F (36.9 C) 98.9 F (37.2 C)   TempSrc: Oral Oral Oral Oral  Resp: 18 16 20 18   Height:      Weight:    105.1 kg (231 lb 11.3 oz)  SpO2: 100% 100% 100% 100%    Weight change: 1.4 kg (3 lb 1.4 oz) Filed Weights   09/01/12 2105 09/03/12 0504  Weight: 103.7 kg (228 lb 9.9 oz) 105.1 kg (231 lb 11.3 oz)   Body mass index is 31.42 kg/(m^2).   Gen Exam: Awake and alert with clear speech.   Neck: Supple, No JVD.   Chest: B/L Clear.   CVS: S1 S2 Regular, no murmurs.  Abdomen: soft, BS +, non tender, non distended. Colostomy in place Extremities: no edema, lower extremities warm to touch.Atrophied B/L lower ext Neurologic: Quadriplegic Skin: No Rash.   Wounds:Stage 4 sacral decubitus  Intake/Output from previous day:  Intake/Output Summary (Last 24 hours) at 09/03/12 0957 Last data filed at 09/03/12 0839  Gross per 24 hour  Intake   1979 ml  Output   2525 ml  Net   -546 ml     LAB RESULTS: CBC  Recent Labs Lab 09/01/12 1550 09/03/12 0500  WBC 18.4* 14.8*  HGB 8.5* 8.1*  HCT 27.8* 26.4*  PLT 618* 488*  MCV 77.7* 77.4*  MCH 23.7* 23.8*  MCHC 30.6 30.7  RDW 19.4* 19.3*  LYMPHSABS 2.4  --   MONOABS 2.1*  --   EOSABS 0.7  --   BASOSABS 0.1  --     Chemistries   Recent Labs Lab 09/01/12 1550 09/03/12 0500  NA 143 142  K 5.1 4.4  CL 109 108  CO2 22 21  GLUCOSE 142* 139*  BUN 37* 23  CREATININE 1.12 0.95  CALCIUM 10.1 9.7    CBG:  No results found for this basename: GLUCAP,  in the last 168 hours  GFR Estimated Creatinine Clearance: 121.8 ml/min (by C-G formula based on Cr of 0.95).  Coagulation profile No results found for this basename: INR, PROTIME,  in the last 168 hours  Cardiac Enzymes No results found for this basename: CK, CKMB, TROPONINI, MYOGLOBIN,  in the last 168 hours  No components found with this basename: POCBNP,  No results found for this basename: DDIMER,  in the last 72 hours No results found for this basename: HGBA1C,  in the last 72 hours No results found  for this basename: CHOL, HDL, LDLCALC, TRIG, CHOLHDL, LDLDIRECT,  in the last 72 hours No results found for this basename: TSH, T4TOTAL, FREET3, T3FREE, THYROIDAB,  in the last 72 hours No results found for this basename: VITAMINB12, FOLATE, FERRITIN, TIBC, IRON, RETICCTPCT,  in the last 72 hours No results found for this basename: LIPASE, AMYLASE,  in the last 72 hours  Urine Studies No results found for this basename: UACOL, UAPR, USPG, UPH, UTP, UGL, UKET, UBIL, UHGB, UNIT, UROB, ULEU, UEPI, UWBC, URBC, UBAC, CAST, CRYS, UCOM, BILUA,  in the last 72 hours  MICROBIOLOGY: Recent Results (from the past 240 hour(s))  CULTURE, BLOOD (ROUTINE X 2)     Status: None   Collection Time    09/01/12  3:50 PM      Result Value Range Status   Specimen Description BLOOD RIGHT HAND  5 ML IN [facility] BOTTLE   Final   Special Requests NONE   Final   Culture  Setup Time 09/01/2012 19:57   Final   Culture     Final   Value:        BLOOD CULTURE RECEIVED NO GROWTH TO DATE CULTURE WILL BE HELD FOR 5 DAYS BEFORE ISSUING A FINAL NEGATIVE REPORT   Report Status PENDING   Incomplete  URINE CULTURE     Status: None   Collection Time    09/01/12  4:21 PM      Result Value Range Status   Specimen Description URINE, CLEAN CATCH   Final   Special Requests NONE   Final   Culture  Setup Time 09/01/2012 21:23   Final   Colony Count PENDING   Incomplete   Culture Culture reincubated for better growth   Final   Report Status PENDING   Incomplete  MRSA PCR SCREENING     Status: None   Collection Time    09/01/12 10:39 PM      Result Value Range Status   MRSA by PCR NEGATIVE  NEGATIVE Final   Comment:            The GeneXpert MRSA Assay (FDA     approved for NASAL specimens     only), is one component of a     comprehensive MRSA colonization     surveillance program. It is not     intended to diagnose MRSA     infection nor to guide or     monitor treatment for     MRSA infections.    RADIOLOGY  STUDIES/RESULTS: Dg Chest Port 1 View  09/01/2012  *RADIOLOGY REPORT*  Clinical Data: Fever, sweating.  PORTABLE CHEST - 1 VIEW  Comparison: 08/01/2012  Findings: Cardiomegaly.  Chronic areas of scarring in the lungs bilaterally.  No acute airspace opacities or effusions.  No acute bony abnormality.  IMPRESSION: Stable chronic changes and cardiomegaly.  No acute findings.   Original Report Authenticated  By: Charlett Nose, M.D.    Dg Fluoro Guide Ndl Plc/bx  08/04/2012  *RADIOLOGY REPORT*   Clinical data: Enlarging right hip effusion  RIGHT HIP ASPIRATION UNDER FLUOROSCOPY  Technique: The procedure, risks (including but not limited to bleeding, infection, organ damage), benefits, and alternatives were explained to the patient.  Questions regarding the procedure were encouraged and answered.  The patient understands and consents to the procedure. An appropriate skin entry site was determined under fluoroscopy.  Site was marked, prepped with Betadine, draped in usual sterile fashion, infiltrated locally with 1% lidocaine. A 18 gauge spinal needle was advanced to the right hip joint. 4 ml bloody fluid were aspirated, sent for the requested laboratory studies.  The patient tolerated procedure well, with no immediate complication.  Fluoro time: 16 seconds  IMPRESSION: 1.  Technically successful right hip aspiration  under fluoroscopy   Original Report Authenticated By: D. Andria Rhein, MD     MEDICATIONS: Scheduled Meds: . amLODipine  10 mg Oral Daily  . baclofen  20 mg Oral QID  . docusate sodium  100 mg Oral BID  . enoxaparin (LOVENOX) injection  40 mg Subcutaneous QHS  . famotidine  20 mg Oral BID  . ferrous sulfate  325 mg Oral TID WC  . fluconazole  600 mg Oral Daily  . furosemide  20 mg Oral Daily  . metoCLOPramide  10 mg Oral TID AC  . multivitamin with minerals  1 tablet Oral q morning - 10a  . piperacillin-tazobactam  3.375 g Intravenous Q8H  . potassium chloride SA  20 mEq Oral q morning - 10a   . sodium chloride  3 mL Intravenous Q12H  . vancomycin  1,000 mg Intravenous Q12H  . vitamin C  500 mg Oral BID  . zinc sulfate  220 mg Oral q morning - 10a   Continuous Infusions: . sodium chloride 20 mL/hr at 09/02/12 2300   PRN Meds:.albuterol, ondansetron (ZOFRAN) IV, ondansetron, sodium chloride  Antibiotics: Anti-infectives   Start     Dose/Rate Route Frequency Ordered Stop   09/02/12 2200  vancomycin (VANCOCIN) IVPB 1000 mg/200 mL premix     1,000 mg 200 mL/hr over 60 Minutes Intravenous Every 12 hours 09/02/12 2151     09/02/12 1000  vancomycin (VANCOCIN) IVPB 1000 mg/200 mL premix  Status:  Discontinued     1,000 mg 200 mL/hr over 60 Minutes Intravenous Every 12 hours 09/02/12 0053 09/02/12 0832   09/01/12 2200  piperacillin-tazobactam (ZOSYN) IVPB 3.375 g     3.375 g 12.5 mL/hr over 240 Minutes Intravenous 3 times per day 09/01/12 2121     09/01/12 2145  vancomycin (VANCOCIN) 2,000 mg in sodium chloride 0.9 % 500 mL IVPB     2,000 mg 250 mL/hr over 120 Minutes Intravenous  Once 09/01/12 2142 09/02/12 0030   09/01/12 2130  fluconazole (DIFLUCAN) tablet 600 mg     600 mg Oral Daily 09/01/12 2121         Jeoffrey Massed, MD  [facility] Pager:[phone]  If 7PM-7AM, please contact night-coverage [email or web address] Password [facility] 09/03/2012, 9:57 AM   LOS: 2 days

## 2012-09-03 NOTE — Consult Note (Signed)
WOC consult Note Evaluation of Sacral/Buttocks/ischial tuberosity wounds, POA and present for several years.   Wound type:Pressure and sheer ulcerations  Pressure Ulcer POA: Yes Stage IV that extends over sacrum, and ischal areas bilaterally Measurement: see plastics note and nursing notes Plastics consulted, they have been following Mr. Sassone for several months for his wounds. Possible wash out in the OR Wednesday of this week per PA. Requested wound care for these ulcers, will order normal saline moist guaze dressings until OR procedure at which time plastics may change wound care orders. Dressing procedure/placement/frequency:NS moist to moist gauze, add air mattress overlay for pressure redistribution.   Re consult if needed, will not follow at this time. Plastic to follow wounds.  Thanks  Jisella Ashenfelter [employer], CWOCN [phone])

## 2012-09-03 NOTE — Progress Notes (Signed)
Patient ID: [name] [name], male   DOB: [birth year], 47 y.o.   MRN: [id number]         [facility] for Infectious Disease    Date of Admission:  09/01/2012   Total days of antibiotics 32         Active Problems:   Sacral decubitus ulcer, stage IV   PVD   Obesity   Quadriplegia   Leukocytosis   Seizure disorder   HTN (hypertension)   S/P colostomy   Suprapubic catheter   Complicated UTI (urinary tract infection)   OSA on CPAP   Sepsis syndrome   . amLODipine  10 mg Oral Daily  . baclofen  20 mg Oral QID  . docusate sodium  100 mg Oral BID  . enoxaparin (LOVENOX) injection  40 mg Subcutaneous QHS  . famotidine  20 mg Oral BID  . feeding supplement  30 mL Oral BID WC  . ferrous sulfate  325 mg Oral TID WC  . fluconazole  600 mg Oral Daily  . furosemide  20 mg Oral Daily  . lactose free nutrition  237 mL Oral BID BM  . metoCLOPramide  10 mg Oral TID AC  . multivitamin with minerals  1 tablet Oral q morning - 10a  . nutrition supplement  1 packet Oral BID BM  . piperacillin-tazobactam  3.375 g Intravenous Q8H  . potassium chloride SA  20 mEq Oral q morning - 10a  . sodium chloride  3 mL Intravenous Q12H  . vancomycin  1,000 mg Intravenous Q12H  . vitamin A & D      . vitamin C  500 mg Oral BID  . zinc sulfate  220 mg Oral q morning - 10a    Subjective: He says that he really was not feeling bad when he came to the hospital. He states that he came because the PICC came out. However, upon further questioning he does recall that he had been having new sweats recently. He was not aware of any fever or chills. He not had any known change in his sacral wounds or urine.  Objective: Temp:  [98.4 F (36.9 C)-98.9 F (37.2 C)] 98.7 F (37.1 C) (04/21 1344) Pulse Rate:  [109-118] 109 (04/21 1344) Resp:  [18-20] 18 (04/21 1344) BP: (92-111)/(59-71) 97/59 mmHg (04/21 1344) SpO2:  [100 %] 100 % (04/21 1344) Weight:  [105.1 kg (231 lb 11.3 oz)] 105.1 kg (231 lb 11.3 oz) (04/21  0504)  General: He is alert and comfortable listening to music Skin: No rash. New left arm PICC Lungs: Clear Cor: Regular S1 and S2 no murmurs   Lab Results Lab Results  Component Value Date   WBC 14.8* 09/03/2012   HGB 8.1* 09/03/2012   HCT 26.4* 09/03/2012   MCV 77.4* 09/03/2012   PLT 488* 09/03/2012    Lab Results  Component Value Date   CREATININE 0.95 09/03/2012   BUN 23 09/03/2012   NA 142 09/03/2012   K 4.4 09/03/2012   CL 108 09/03/2012   CO2 21 09/03/2012    Lab Results  Component Value Date   ALT 28 09/01/2012   AST 29 09/01/2012   ALKPHOS 106 09/01/2012   BILITOT 0.1* 09/01/2012      Microbiology: Recent Results (from the past 240 hour(s))  CULTURE, BLOOD (ROUTINE X 2)     Status: None   Collection Time    09/01/12  3:50 PM      Result Value Range Status   Specimen Description  BLOOD RIGHT HAND  5 ML IN [facility] BOTTLE   Final   Special Requests NONE   Final   Culture  Setup Time 09/01/2012 19:57   Final   Culture     Final   Value:        BLOOD CULTURE RECEIVED NO GROWTH TO DATE CULTURE WILL BE HELD FOR 5 DAYS BEFORE ISSUING A FINAL NEGATIVE REPORT   Report Status PENDING   Incomplete  URINE CULTURE     Status: None   Collection Time    09/01/12  4:21 PM      Result Value Range Status   Specimen Description URINE, CLEAN CATCH   Final   Special Requests NONE   Final   Culture  Setup Time 09/01/2012 21:23   Final   Colony Count PENDING   Incomplete   Culture Culture reincubated for better growth   Final   Report Status PENDING   Incomplete  MRSA PCR SCREENING     Status: None   Collection Time    09/01/12 10:39 PM      Result Value Range Status   MRSA by PCR NEGATIVE  NEGATIVE Final   Comment:            The GeneXpert MRSA Assay (FDA     approved for NASAL specimens     only), is one component of a     comprehensive MRSA colonization     surveillance program. It is not     intended to diagnose MRSA     infection nor to guide or     monitor treatment for      MRSA infections.    Studies/Results: Ir Fluoro Guide Cv Line Left  09/02/2012  *RADIOLOGY REPORT*  PICC PLACEMENT WITH ULTRASOUND AND FLUOROSCOPIC  GUIDANCE  Clinical History: 47 year old male with paraplegia and chronic hip infection requiring long-term intravenous antibiotic and antifungal medications.  This previously placed PICC catheter was accidentally dislodged last night.  He presents to the emergency department for PICC replacement.  Fluoroscopy Time: 42 seconds  Procedure:  The left arm was prepped with chlorhexidine, draped in the usual sterile fashion using maximum barrier technique (cap and mask, sterile gown, sterile gloves, large sterile sheet, hand hygiene and cutaneous antiseptic).  Local anesthesia was attained by infiltration with 1% lidocaine.  Ultrasound demonstrated patency of the left brachial vein, and this was documented with an image.  Under real-time ultrasound guidance, this vein was accessed with a 21 gauge micropuncture needle and image documentation was performed.  The needle was exchanged over a guidewire for a peel-away sheath through which a 52 cm 5 [place] dual lumen power injectable PICC was advanced, and positioned with its tip at the lower SVC/right atrial junction.  Fluoroscopy during the procedure and fluoro spot radiograph confirms appropriate catheter position.  The catheter was flushed, secured to the skin with Prolene sutures, and covered with a sterile dressing.  Complications:  None.  The patient tolerated the procedure well.  IMPRESSION: Successful placement of a left arm PICC with sonographic and fluoroscopic guidance.  The catheter is ready for use.  Signed,  Sterling Big, MD Vascular & Interventional Radiologist [facility]   Original Report Authenticated By: Malachy Moan, M.D.    Ir US Guide Vasc Access Left  09/02/2012  *RADIOLOGY REPORT*  PICC PLACEMENT WITH ULTRASOUND AND FLUOROSCOPIC  GUIDANCE  Clinical History: 47 year old male with  paraplegia and chronic hip infection requiring long-term intravenous antibiotic and antifungal medications.  This previously  placed PICC catheter was accidentally dislodged last night.  He presents to the emergency department for PICC replacement.  Fluoroscopy Time: 42 seconds  Procedure:  The left arm was prepped with chlorhexidine, draped in the usual sterile fashion using maximum barrier technique (cap and mask, sterile gown, sterile gloves, large sterile sheet, hand hygiene and cutaneous antiseptic).  Local anesthesia was attained by infiltration with 1% lidocaine.  Ultrasound demonstrated patency of the left brachial vein, and this was documented with an image.  Under real-time ultrasound guidance, this vein was accessed with a 21 gauge micropuncture needle and image documentation was performed.  The needle was exchanged over a guidewire for a peel-away sheath through which a 52 cm 5 [place] dual lumen power injectable PICC was advanced, and positioned with its tip at the lower SVC/right atrial junction.  Fluoroscopy during the procedure and fluoro spot radiograph confirms appropriate catheter position.  The catheter was flushed, secured to the skin with Prolene sutures, and covered with a sterile dressing.  Complications:  None.  The patient tolerated the procedure well.  IMPRESSION: Successful placement of a left arm PICC with sonographic and fluoroscopic guidance.  The catheter is ready for use.  Signed,  Sterling Big, MD Vascular & Interventional Radiologist [facility]   Original Report Authenticated By: Malachy Moan, M.D.    Dg Chest Port 1 View  09/01/2012  *RADIOLOGY REPORT*  Clinical Data: Fever, sweating.  PORTABLE CHEST - 1 VIEW  Comparison: 08/01/2012  Findings: Cardiomegaly.  Chronic areas of scarring in the lungs bilaterally.  No acute airspace opacities or effusions.  No acute bony abnormality.  IMPRESSION: Stable chronic changes and cardiomegaly.  No acute findings.    Original Report Authenticated By: Charlett Nose, M.D.     Assessment: It is unclear to me if he has any new infection causing his recent sweats. I will continue vancomycin, piperacillin tazobactam and fluconazole pending repeat blood and urine cultures in followup tomorrow.  Plan: 1. Continue current antibiotics pending final cultures  Cliffton Asters, MD [facility] Disease [facility] [phone] pager   [phone] cell 09/03/2012, 3:16 PM

## 2012-09-04 ENCOUNTER — Encounter (HOSPITAL_COMMUNITY): Payer: Self-pay | Admitting: Physician Assistant

## 2012-09-04 LAB — BASIC METABOLIC PANEL
Calcium: 9.1 mg/dL (ref 8.4–10.5)
GFR calc non Af Amer: 89 mL/min — ABNORMAL LOW (ref >90)
Sodium: 141 mEq/L (ref 135–145)

## 2012-09-04 LAB — CBC
MCH: 23.1 pg — ABNORMAL LOW (ref 26.0–34.0)
MCHC: 30.1 g/dL (ref 30.0–36.0)
Platelets: 417 10*3/uL — ABNORMAL HIGH (ref 150–400)
RDW: 18.7 % — ABNORMAL HIGH (ref 11.5–15.5)

## 2012-09-04 MED ORDER — ACETAMINOPHEN 325 MG PO TABS
650.0000 mg | ORAL_TABLET | Freq: Once | ORAL | Status: AC
  Administered 2012-09-04: 650 mg via ORAL
  Filled 2012-09-04: qty 2

## 2012-09-04 MED ORDER — FUROSEMIDE 10 MG/ML IJ SOLN
20.0000 mg | Freq: Once | INTRAMUSCULAR | Status: AC
  Administered 2012-09-04: 20 mg via INTRAVENOUS
  Filled 2012-09-04: qty 2

## 2012-09-04 MED ORDER — DIPHENHYDRAMINE HCL 50 MG/ML IJ SOLN
25.0000 mg | Freq: Once | INTRAMUSCULAR | Status: AC
  Administered 2012-09-04: 25 mg via INTRAVENOUS
  Filled 2012-09-04: qty 1

## 2012-09-04 NOTE — Progress Notes (Signed)
Placed pt. On cpap. Pt. Is tolerating well at this time.  

## 2012-09-04 NOTE — Progress Notes (Signed)
Patient ID: [name] [name], male   DOB: Mar 17, 1966, 47 y.o.   MRN: [id number]         [facility] for Infectious Disease    Date of Admission:  09/01/2012   Total days of antibiotics 33         Active Problems:   Sacral decubitus ulcer, stage IV   PVD   Obesity   Quadriplegia   Leukocytosis   Seizure disorder   HTN (hypertension)   S/P colostomy   Suprapubic catheter   Complicated UTI (urinary tract infection)   OSA on CPAP   Sepsis syndrome   . amLODipine  10 mg Oral Daily  . baclofen  20 mg Oral QID  . docusate sodium  100 mg Oral BID  . enoxaparin (LOVENOX) injection  40 mg Subcutaneous QHS  . famotidine  20 mg Oral BID  . feeding supplement  30 mL Oral BID WC  . ferrous sulfate  325 mg Oral TID WC  . fluconazole  600 mg Oral Daily  . furosemide  20 mg Intravenous Once  . furosemide  20 mg Oral Daily  . lactose free nutrition  237 mL Oral BID BM  . metoCLOPramide  10 mg Oral TID AC  . multivitamin with minerals  1 tablet Oral q morning - 10a  . nutrition supplement  1 packet Oral BID BM  . piperacillin-tazobactam  3.375 g Intravenous Q8H  . potassium chloride SA  20 mEq Oral q morning - 10a  . sodium chloride  3 mL Intravenous Q12H  . vancomycin  1,000 mg Intravenous Q12H  . vitamin C  500 mg Oral BID  . zinc sulfate  220 mg Oral q morning - 10a    Subjective: He is feeling better and his sweats have resolved.  Objective: Temp:  [98.3 F (36.8 C)-98.9 F (37.2 C)] 98.3 F (36.8 C) (04/22 1500) Pulse Rate:  [77-122] 107 (04/22 1500) Resp:  [16-20] 16 (04/22 1500) BP: (111-133)/(62-85) 126/62 mmHg (04/22 1500) SpO2:  [100 %] 100 % (04/22 1440)  General: He is in good spirits Sacral wound is not examined today  Lab Results Lab Results  Component Value Date   WBC 12.3* 09/04/2012   HGB 7.4* 09/04/2012   HCT 24.6* 09/04/2012   MCV 76.6* 09/04/2012   PLT 417* 09/04/2012    Lab Results  Component Value Date   CREATININE 1.00 09/04/2012   BUN 26*  09/04/2012   NA 141 09/04/2012   K 3.8 09/04/2012   CL 108 09/04/2012   CO2 21 09/04/2012    Lab Results  Component Value Date   ALT 28 09/01/2012   AST 29 09/01/2012   ALKPHOS 106 09/01/2012   BILITOT 0.1* 09/01/2012      Microbiology: Recent Results (from the past 240 hour(s))  CULTURE, BLOOD (ROUTINE X 2)     Status: None   Collection Time    09/01/12  3:50 PM      Result Value Range Status   Specimen Description BLOOD RIGHT HAND  5 ML IN [facility] BOTTLE   Final   Special Requests NONE   Final   Culture  Setup Time 09/01/2012 19:57   Final   Culture     Final   Value:        BLOOD CULTURE RECEIVED NO GROWTH TO DATE CULTURE WILL BE HELD FOR 5 DAYS BEFORE ISSUING A FINAL NEGATIVE REPORT   Report Status PENDING   Incomplete  URINE CULTURE  Status: None   Collection Time    09/01/12  4:21 PM      Result Value Range Status   Specimen Description URINE, CLEAN CATCH   Final   Special Requests NONE   Final   Culture  Setup Time 09/01/2012 21:23   Final   Colony Count 25,000 COLONIES/ML   Final   Culture STREPTOCOCCUS SPECIES   Final   Report Status PENDING   Incomplete  MRSA PCR SCREENING     Status: None   Collection Time    09/01/12 10:39 PM      Result Value Range Status   MRSA by PCR NEGATIVE  NEGATIVE Final   Comment:            The GeneXpert MRSA Assay (FDA     approved for NASAL specimens     only), is one component of a     comprehensive MRSA colonization     surveillance program. It is not     intended to diagnose MRSA     infection nor to guide or     monitor treatment for     MRSA infections.   Assessment: I am not entirely clear to me what was causing his recent sweats. His urine culture is growing a strep species probably was susceptible to the ertapenem he was receiving at home. I will continue his current antimicrobial regimen pending final cultures.  Plan: 1. Continue current antimicrobial agents pending final cultures 2. He is scheduled for debridement of  his sacral wounds tomorrow  Cliffton Asters, MD [facility] [phone] pager   [phone] cell 09/04/2012, 3:07 PM

## 2012-09-04 NOTE — Care Management Note (Addendum)
    Page 1 of 2   09/07/2012     1:16:54 PM   CARE MANAGEMENT NOTE 09/07/2012  Patient:  [name] [name],[name] [name]   Account Number:  [id number]  Date Initiated:  09/04/2012  Documentation initiated by:  Lanier Clam  Subjective/Objective Assessment:   ADMITTED W/SEPSIS SYNDROME.OZ:[id number],ZDGLOV BAG, UTI,OBESITY.     Action/Plan:   FROM HOME.HAS LIVE IN CAREGIVER.HAS PCP,PHARMACY.ACTIVE W/[facility]-RN-IV INFUSION.HAS W/C,HOYER LIFT,hoACTIVE W/THN.   Anticipated DC Date:  09/07/2012   Anticipated DC Plan:  HOME W HOME HEALTH SERVICES      DC Planning Services  CM consult      [facility] Choice  Resumption Of Svcs/PTA Provider   Choice offered to / List presented to:  C-1 Patient   DME arranged  VAC      DME agency  [facility]     HH arranged  HH-1 RN  IV Antibiotics      HH agency  [facility]   Status of service:  Completed, signed off Medicare Important Message given?   (If response is "NO", the following Medicare IM given date fields will be blank) Date Medicare IM given:   Date Additional Medicare IM given:    Discharge Disposition:  HOME W HOME HEALTH SERVICES  Per UR Regulation:  Reviewed for med. necessity/level of care/duration of stay  If discussed at Long Length of Stay Meetings, dates discussed:   09/06/2012    Comments:  09/07/12 Arelene Moroni RN,BSN NCM [phone] SPOKE TO SHAWN RAYBURN(PA) CONCERNING D/C PLANS-WOUND VAC PER [facility] APPROVED,WOUND VAC TO BE PUT ON WHILE IN HOSPITAL,& MANAGED BY DR. Erasmo Score CARE CLINIC APPT ALREADY SET FOR MONDAY 09/10/12  8A.[facility] KRISTEN (REP) AWARE OF HHRN-IV ABX(SCRIPT),PICC LINE CARE,LABS-CBC DRAW MONDAY,PHARMACY DOSING.FAXED W/CONFIRMATION TO WOUND CARE CLINIC-FAX#[phone];,& [facility]- FAX#[phone] LETTER OF MEDICAL NECESSITY PER DR. SANGER FOR TRANSPORTATION.TC [facility] RICKIE(REP) FOR DELIVERY OF WOUND VAC TO HOSPITAL,& WILL BE PLACED ON PRIOR D/C VIA [facility] TEL#[phone] RICKIE-ABLE TO TRANSP WITH WOUND VAC TO HOME.MD/NSG  AWARE.  09/06/12 Sheilyn Boehlke RN,BSN NCM [phone] [facility] FOLLOWING FOR HHRN.FAXED TO [facility] WOUND VAC FORM,H&P,OP NOTE,FACE SHEET W/CONFIRMATION TO [phone],TEL#[phone].AWAITING AUTH FOR [facility] WOUND VAC PER MD.  09/04/12 Yani Coventry RN,BSN NCM [phone] I&D OF DECUB ULCERS TOMORROW.NOTED ?WOUND VAC.IF HOME W/WOUND VAC & IV ABX CAN ARRANGE.ACTIVE W/[facility] KRISTEN(REP) AWARE & FOLLOWING.ID FOLLOWING.WILL NEED AMBULANCE TRANSPORTATION @ D/C.

## 2012-09-04 NOTE — Progress Notes (Signed)
PATIENT DETAILS Name: [name] [name] Age: 47 y.o. Sex: male Date of Birth: 09/17/1965 Admit Date: 09/01/2012 Admitting Physician Houston Siren, MD QIO:[id number],UXLKG N, MD  Subjective: No major complaints overnight. For OR today.  Assessment/Plan: Active Problems: Sepsis syndrome -complicated patient-recent h/o right septic hip joint, chronic pelvic osteo and sacral decubitus-getting IV Invanz and oral fluconazole-prior to hospitalization-before PICC line dislodged and came off. Per my review of discharge summary from [facility] in Epic-Arthrocentesis under IR guidance done at [facility] was positive for Coag neg staph and Candida Parapsilosis.Patient was discharfged on 4/11-with oral fluconazole and IV Invanz to complete 6 weeks of therapy -Multiple sites of infection-PICC line, chronic foley, sacral wounds, right hip-at this time. But afebrile overnight and decreasing leukocytosis -c/w IV Vanco/Zosyn/Fluconazole- infectious disease directing antibiotic therapy. 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 -c/w Ferrous Sulfate  HTN -controlled  -c/w Amlodipine/Lasix  Quad/Chronic Suprapubic catheter/Colostomy -stable    Disposition: Remain inpatient- will need home health services resumed on discharge  DVT Prophylaxis: Prophylactic Lovenox   Code  Status: Full code   Procedures:  None  CONSULTS:  ID  PHYSICAL EXAM: Vital signs in last 24 hours: Filed Vitals:   09/03/12 0504 09/03/12 1344 09/03/12 2139 09/04/12 0418  BP: 92/63 97/59 111/79 133/85  Pulse: 118 109 109 122  Temp: 98.9 F (37.2 C) 98.7 F (37.1 C) 98.9 F (37.2 C) 98.3 F (36.8 C)  TempSrc: Oral Oral Oral Oral  Resp: 18 18 18 20   Height:      Weight: 105.1 kg (231 lb 11.3 oz)     SpO2: 100% 100% 100% 100%    Weight change:  Filed Weights   09/01/12 2105 09/03/12 0504  Weight: 103.7 kg (228 lb 9.9 oz) 105.1 kg (231 lb 11.3 oz)   Body mass index is 31.42 kg/(m^2).   Gen Exam: Awake and alert with clear speech.   Neck: Supple, No JVD.   Chest: B/L Clear.   CVS: S1 S2 Regular, no murmurs.  Abdomen: soft, BS +, non tender, non distended. Colostomy in place Extremities: no edema, lower extremities warm to touch.Atrophied B/L lower ext Neurologic: Quadriplegic Skin: No Rash.   Wounds:Stage 4 sacral decubitus  Intake/Output from previous day:  Intake/Output Summary (Last 24 hours) at 09/04/12 1132 Last data filed at 09/04/12 0836  Gross per 24 hour  Intake 3256.33 ml  Output   3450 ml  Net -193.67 ml     LAB RESULTS: CBC  Recent Labs Lab 09/01/12 1550 09/03/12 0500 09/04/12 0500  WBC 18.4* 14.8* 12.3*  HGB 8.5* 8.1* 7.4*  HCT 27.8* 26.4* 24.6*  PLT 618* 488* 417*  MCV 77.7* 77.4* 76.6*  MCH 23.7* 23.8* 23.1*  MCHC 30.6 30.7 30.1  RDW 19.4* 19.3* 18.7*  LYMPHSABS 2.4  --   --   MONOABS 2.1*  --   --  EOSABS 0.7  --   --   BASOSABS 0.1  --   --     Chemistries   Recent Labs Lab 09/01/12 1550 09/03/12 0500 09/04/12 0500  NA 143 142 141  K 5.1 4.4 3.8  CL 109 108 108  CO2 22 21 21   GLUCOSE 142* 139* 121*  BUN 37* 23 26*  CREATININE 1.12 0.95 1.00  CALCIUM 10.1 9.7 9.1    CBG:  Recent Labs Lab 09/03/12 2013  GLUCAP 176*    GFR Estimated Creatinine Clearance: 115.7 ml/min (by C-G formula based on Cr of  1).  Coagulation profile No results found for this basename: INR, PROTIME,  in the last 168 hours  Cardiac Enzymes No results found for this basename: CK, CKMB, TROPONINI, MYOGLOBIN,  in the last 168 hours  No components found with this basename: POCBNP,  No results found for this basename: DDIMER,  in the last 72 hours No results found for this basename: HGBA1C,  in the last 72 hours No results found for this basename: CHOL, HDL, LDLCALC, TRIG, CHOLHDL, LDLDIRECT,  in the last 72 hours No results found for this basename: TSH, T4TOTAL, FREET3, T3FREE, THYROIDAB,  in the last 72 hours No results found for this basename: VITAMINB12, FOLATE, FERRITIN, TIBC, IRON, RETICCTPCT,  in the last 72 hours No results found for this basename: LIPASE, AMYLASE,  in the last 72 hours  Urine Studies No results found for this basename: UACOL, UAPR, USPG, UPH, UTP, UGL, UKET, UBIL, UHGB, UNIT, UROB, ULEU, UEPI, UWBC, URBC, UBAC, CAST, CRYS, UCOM, BILUA,  in the last 72 hours  MICROBIOLOGY: Recent Results (from the past 240 hour(s))  CULTURE, BLOOD (ROUTINE X 2)     Status: None   Collection Time    09/01/12  3:50 PM      Result Value Range Status   Specimen Description BLOOD RIGHT HAND  5 ML IN [facility] BOTTLE   Final   Special Requests NONE   Final   Culture  Setup Time 09/01/2012 19:57   Final   Culture     Final   Value:        BLOOD CULTURE RECEIVED NO GROWTH TO DATE CULTURE WILL BE HELD FOR 5 DAYS BEFORE ISSUING A FINAL NEGATIVE REPORT   Report Status PENDING   Incomplete  URINE CULTURE     Status: None   Collection Time    09/01/12  4:21 PM      Result Value Range Status   Specimen Description URINE, CLEAN CATCH   Final   Special Requests NONE   Final   Culture  Setup Time 09/01/2012 21:23   Final   Colony Count 25,000 COLONIES/ML   Final   Culture STREPTOCOCCUS SPECIES   Final   Report Status PENDING   Incomplete  MRSA PCR SCREENING     Status: None   Collection Time    09/01/12 10:39 PM       Result Value Range Status   MRSA by PCR NEGATIVE  NEGATIVE Final   Comment:            The GeneXpert MRSA Assay (FDA     approved for NASAL specimens     only), is one component of a     comprehensive MRSA colonization     surveillance program. It is not     intended to diagnose MRSA     infection nor to guide or     monitor treatment for  MRSA infections.    RADIOLOGY STUDIES/RESULTS: Dg Chest Port 1 View  09/01/2012  *RADIOLOGY REPORT*  Clinical Data: Fever, sweating.  PORTABLE CHEST - 1 VIEW  Comparison: 08/01/2012  Findings: Cardiomegaly.  Chronic areas of scarring in the lungs bilaterally.  No acute airspace opacities or effusions.  No acute bony abnormality.  IMPRESSION: Stable chronic changes and cardiomegaly.  No acute findings.   Original Report Authenticated By: Charlett Nose, M.D.    Dg Fluoro Guide Ndl Plc/bx  08/04/2012  *RADIOLOGY REPORT*   Clinical data: Enlarging right hip effusion  RIGHT HIP ASPIRATION UNDER FLUOROSCOPY  Technique: The procedure, risks (including but not limited to bleeding, infection, organ damage), benefits, and alternatives were explained to the patient.  Questions regarding the procedure were encouraged and answered.  The patient understands and consents to the procedure. An appropriate skin entry site was determined under fluoroscopy.  Site was marked, prepped with Betadine, draped in usual sterile fashion, infiltrated locally with 1% lidocaine. A 18 gauge spinal needle was advanced to the right hip joint. 4 ml bloody fluid were aspirated, sent for the requested laboratory studies.  The patient tolerated procedure well, with no immediate complication.  Fluoro time: 16 seconds  IMPRESSION: 1.  Technically successful right hip aspiration  under fluoroscopy   Original Report Authenticated By: D. Andria Rhein, MD     MEDICATIONS: Scheduled Meds: . acetaminophen  650 mg Oral Once  . amLODipine  10 mg Oral Daily  . baclofen  20 mg Oral QID  .  diphenhydrAMINE  25 mg Intravenous Once  . docusate sodium  100 mg Oral BID  . enoxaparin (LOVENOX) injection  40 mg Subcutaneous QHS  . famotidine  20 mg Oral BID  . feeding supplement  30 mL Oral BID WC  . ferrous sulfate  325 mg Oral TID WC  . fluconazole  600 mg Oral Daily  . furosemide  20 mg Intravenous Once  . furosemide  20 mg Oral Daily  . lactose free nutrition  237 mL Oral BID BM  . metoCLOPramide  10 mg Oral TID AC  . multivitamin with minerals  1 tablet Oral q morning - 10a  . nutrition supplement  1 packet Oral BID BM  . piperacillin-tazobactam  3.375 g Intravenous Q8H  . potassium chloride SA  20 mEq Oral q morning - 10a  . sodium chloride  3 mL Intravenous Q12H  . vancomycin  1,000 mg Intravenous Q12H  . vitamin C  500 mg Oral BID  . zinc sulfate  220 mg Oral q morning - 10a   Continuous Infusions: . sodium chloride 20 mL/hr at 09/02/12 2300   PRN Meds:.albuterol, ondansetron (ZOFRAN) IV, ondansetron, sodium chloride  Antibiotics: Anti-infectives   Start     Dose/Rate Route Frequency Ordered Stop   09/02/12 2200  vancomycin (VANCOCIN) IVPB 1000 mg/200 mL premix     1,000 mg 200 mL/hr over 60 Minutes Intravenous Every 12 hours 09/02/12 2151     09/02/12 1000  vancomycin (VANCOCIN) IVPB 1000 mg/200 mL premix  Status:  Discontinued     1,000 mg 200 mL/hr over 60 Minutes Intravenous Every 12 hours 09/02/12 0053 09/02/12 0832   09/01/12 2200  piperacillin-tazobactam (ZOSYN) IVPB 3.375 g     3.375 g 12.5 mL/hr over 240 Minutes Intravenous 3 times per day 09/01/12 2121     09/01/12 2145  vancomycin (VANCOCIN) 2,000 mg in sodium chloride 0.9 % 500 mL IVPB     2,000 mg 250 mL/hr over  120 Minutes Intravenous  Once 09/01/12 2142 09/02/12 0030   09/01/12 2130  fluconazole (DIFLUCAN) tablet 600 mg     600 mg Oral Daily 09/01/12 2121         Jeoffrey Massed, MD  [facility] Pager:[phone]  If 7PM-7AM, please contact  night-coverage [email or web address] Password [facility] 09/04/2012, 11:32 AM   LOS: 3 days

## 2012-09-05 ENCOUNTER — Inpatient Hospital Stay (HOSPITAL_COMMUNITY): Payer: Medicare Other | Admitting: Anesthesiology

## 2012-09-05 ENCOUNTER — Other Ambulatory Visit: Payer: Self-pay | Admitting: Plastic Surgery

## 2012-09-05 ENCOUNTER — Encounter (HOSPITAL_COMMUNITY): Payer: Self-pay | Admitting: Anesthesiology

## 2012-09-05 ENCOUNTER — Encounter (HOSPITAL_COMMUNITY): Payer: Self-pay | Admitting: *Deleted

## 2012-09-05 ENCOUNTER — Encounter: Payer: Self-pay | Admitting: Plastic Surgery

## 2012-09-05 ENCOUNTER — Encounter (HOSPITAL_COMMUNITY)
Admission: EM | Disposition: A | Discharge status: Home / Self Care | Payer: Self-pay | Source: Home / Self Care | Attending: Internal Medicine

## 2012-09-05 ENCOUNTER — Ambulatory Visit: Payer: Medicare Other | Admitting: Infectious Diseases

## 2012-09-05 DIAGNOSIS — Z933 Colostomy status: Secondary | ICD-10-CM

## 2012-09-05 DIAGNOSIS — L8993 Pressure ulcer of unspecified site, stage 3: Secondary | ICD-10-CM

## 2012-09-05 HISTORY — PX: INCISION AND DRAINAGE OF WOUND: SHX1803

## 2012-09-05 LAB — COMPREHENSIVE METABOLIC PANEL
ALT: 30 U/L (ref 0–53)
Alkaline Phosphatase: 102 U/L (ref 39–117)
CO2: 24 mEq/L (ref 19–32)
Calcium: 9.2 mg/dL (ref 8.4–10.5)
Chloride: 103 mEq/L (ref 96–112)
GFR calc Af Amer: >90 mL/min (ref >90)
GFR calc non Af Amer: >90 mL/min (ref >90)
Glucose, Bld: 110 mg/dL — ABNORMAL HIGH (ref 70–99)
Sodium: 136 mEq/L (ref 135–145)
Total Bilirubin: 0.1 mg/dL — ABNORMAL LOW (ref 0.3–1.2)

## 2012-09-05 LAB — URINE CULTURE: Colony Count: 25000

## 2012-09-05 LAB — CBC
Hemoglobin: 8.3 g/dL — ABNORMAL LOW (ref 13.0–17.0)
MCH: 24.8 pg — ABNORMAL LOW (ref 26.0–34.0)
RBC: 3.35 MIL/uL — ABNORMAL LOW (ref 4.22–5.81)

## 2012-09-05 SURGERY — IRRIGATION AND DEBRIDEMENT WOUND
Anesthesia: General | Site: Buttocks | Wound class: Dirty or Infected

## 2012-09-05 MED ORDER — SODIUM CHLORIDE 0.9 % IV SOLN
1.0000 g | Freq: Every day | INTRAVENOUS | Status: DC
  Administered 2012-09-05 – 2012-09-07 (×4): 1 g via INTRAVENOUS
  Filled 2012-09-05 (×3): qty 1

## 2012-09-05 MED ORDER — FENTANYL CITRATE 0.05 MG/ML IJ SOLN: 25.0000 ug | INTRAMUSCULAR | Status: DC | PRN

## 2012-09-05 MED ORDER — NEOSTIGMINE METHYLSULFATE 1 MG/ML IJ SOLN
INTRAMUSCULAR | Status: DC | PRN
  Administered 2012-09-05: 2 mg via INTRAVENOUS

## 2012-09-05 MED ORDER — LACTATED RINGERS IV SOLN
INTRAVENOUS | Status: DC
Start: 2012-09-05 — End: 2012-09-05

## 2012-09-05 MED ORDER — SODIUM CHLORIDE 0.9 % IR SOLN
Status: DC | PRN
  Administered 2012-09-05: 1000 mL

## 2012-09-05 MED ORDER — ROCURONIUM BROMIDE 100 MG/10ML IV SOLN
INTRAVENOUS | Status: DC | PRN
  Administered 2012-09-05: 50 mg via INTRAVENOUS

## 2012-09-05 MED ORDER — 0.9 % SODIUM CHLORIDE (POUR BTL) OPTIME
TOPICAL | Status: DC | PRN
  Administered 2012-09-05: 1000 mL

## 2012-09-05 MED ORDER — PHENYLEPHRINE HCL 10 MG/ML IJ SOLN
INTRAMUSCULAR | Status: DC | PRN
  Administered 2012-09-05: 40 ug via INTRAVENOUS
  Administered 2012-09-05: 80 ug via INTRAVENOUS
  Administered 2012-09-05 (×2): 40 ug via INTRAVENOUS
  Administered 2012-09-05: 80 ug via INTRAVENOUS
  Administered 2012-09-05: 40 ug via INTRAVENOUS

## 2012-09-05 MED ORDER — GLYCOPYRROLATE 0.2 MG/ML IJ SOLN
INTRAMUSCULAR | Status: DC | PRN
  Administered 2012-09-05: .4 mg via INTRAVENOUS

## 2012-09-05 MED ORDER — ONDANSETRON HCL 4 MG/2ML IJ SOLN
INTRAMUSCULAR | Status: DC | PRN
  Administered 2012-09-05: 4 mg via INTRAVENOUS

## 2012-09-05 MED ORDER — MEPERIDINE HCL 25 MG/ML IJ SOLN: 6.2500 mg | INTRAMUSCULAR | Status: DC | PRN

## 2012-09-05 MED ORDER — PROPOFOL 10 MG/ML IV BOLUS
INTRAVENOUS | Status: DC | PRN
  Administered 2012-09-05: 180 mg via INTRAVENOUS

## 2012-09-05 MED ORDER — PROMETHAZINE HCL 25 MG/ML IJ SOLN: 6.2500 mg | INTRAMUSCULAR | Status: DC | PRN

## 2012-09-05 MED ORDER — LIDOCAINE HCL (CARDIAC) 20 MG/ML IV SOLN
INTRAVENOUS | Status: DC | PRN
  Administered 2012-09-05: 50 mg via INTRAVENOUS

## 2012-09-05 MED ORDER — SODIUM CHLORIDE 0.9 % IR SOLN
Status: DC | PRN
  Administered 2012-09-05: 14:00:00

## 2012-09-05 MED ORDER — MIDAZOLAM HCL 5 MG/5ML IJ SOLN
INTRAMUSCULAR | Status: DC | PRN
  Administered 2012-09-05: 2 mg via INTRAVENOUS

## 2012-09-05 MED ORDER — FENTANYL CITRATE 0.05 MG/ML IJ SOLN
INTRAMUSCULAR | Status: DC | PRN
  Administered 2012-09-05 (×2): 50 ug via INTRAVENOUS

## 2012-09-05 MED ORDER — LACTATED RINGERS IV SOLN
INTRAVENOUS | Status: DC | PRN
  Administered 2012-09-05: 12:00:00 via INTRAVENOUS

## 2012-09-05 MED ORDER — EPHEDRINE SULFATE 50 MG/ML IJ SOLN
INTRAMUSCULAR | Status: DC | PRN
  Administered 2012-09-05: 5 mg via INTRAVENOUS

## 2012-09-05 SURGICAL SUPPLY — 35 items
BANDAGE GAUZE ELAST BULKY 4 IN (GAUZE/BANDAGES/DRESSINGS) ×1 IMPLANT
BLADE HEX COATED 2.75 (ELECTRODE) ×2 IMPLANT
CANISTER SUCTION 2500CC (MISCELLANEOUS) ×2 IMPLANT
CLOTH BEACON ORANGE TIMEOUT ST (SAFETY) ×2 IMPLANT
CO AXIAL FAN SPRAY TIP SOFT SH (MISCELLANEOUS) ×1 IMPLANT
COVER SURGICAL LIGHT HANDLE (MISCELLANEOUS) ×2 IMPLANT
DECANTER SPIKE VIAL GLASS SM (MISCELLANEOUS) IMPLANT
DRAPE INCISE IOBAN 66X45 STRL (DRAPES) ×1 IMPLANT
DRAPE LAPAROTOMY T 102X78X121 (DRAPES) ×2 IMPLANT
DRAPE POUCH INSTRU U-SHP 10X18 (DRAPES) ×1 IMPLANT
DRAPE UTILITY XL STRL (DRAPES) ×2 IMPLANT
DRSG EMULSION OIL 3X16 NADH (GAUZE/BANDAGES/DRESSINGS) ×1 IMPLANT
DRSG EMULSION OIL 3X3 NADH (GAUZE/BANDAGES/DRESSINGS) ×2 IMPLANT
DRSG PAD ABDOMINAL 8X10 ST (GAUZE/BANDAGES/DRESSINGS) ×6 IMPLANT
DRSG VAC ATS MED SENSATRAC (GAUZE/BANDAGES/DRESSINGS) ×2 IMPLANT
ELECT BLADE TIP CTD 4 INCH (ELECTRODE) ×1 IMPLANT
ELECT REM PT RETURN 9FT ADLT (ELECTROSURGICAL) ×2
ELECTRODE REM PT RTRN 9FT ADLT (ELECTROSURGICAL) ×1 IMPLANT
GOWN STRL NON-REIN LRG LVL3 (GOWN DISPOSABLE) ×4 IMPLANT
HANDPIECE INTERPULSE COAX TIP (DISPOSABLE) ×2
KIT BASIN OR (CUSTOM PROCEDURE TRAY) ×2 IMPLANT
LUBRICANT JELLY K Y 4OZ (MISCELLANEOUS) ×2 IMPLANT
MATRIX SURGICAL PSM 10X15CM (Tissue) ×1 IMPLANT
MICROMATRIX 1000MG (Tissue) ×4 IMPLANT
NEEDLE HYPO 22GX1.5 SAFETY (NEEDLE) IMPLANT
NS IRRIG 1000ML POUR BTL (IV SOLUTION) ×2 IMPLANT
PACK GENERAL/GYN (CUSTOM PROCEDURE TRAY) ×2 IMPLANT
SET HNDPC FAN SPRY TIP SCT (DISPOSABLE) ×1 IMPLANT
SOLUTION PARTIC MCRMTRX 1000MG (Tissue) IMPLANT
SPONGE GAUZE 4X4 12PLY (GAUZE/BANDAGES/DRESSINGS) ×2 IMPLANT
SPONGE LAP 18X18 X RAY DECT (DISPOSABLE) ×1 IMPLANT
STAPLER VISISTAT 35W (STAPLE) ×2 IMPLANT
SUT MON AB 5-0 PS2 18 (SUTURE) ×2 IMPLANT
SYR CONTROL 10ML LL (SYRINGE) IMPLANT
TOWEL OR 17X26 10 PK STRL BLUE (TOWEL DISPOSABLE) ×2 IMPLANT

## 2012-09-05 NOTE — Progress Notes (Signed)
Dressing changes were done this am. Will continue to monitor patient.

## 2012-09-05 NOTE — Op Note (Signed)
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 The patient was taken to the OR and underwent general anesthesia.  Once it was adequate he was placed in the prone position with adequate padding.  A time out was called and all information was confirmed to be correct.  The skin was sterilely prepped and draped over the affected area in the usual fashion. The irrigation was done with 2 liters of NS and antibiotic solution.  A #10 blade was used on the ischial and sacral ulcers to debride the skin to bone.  Specimens were sent for gram stain/culture and sensitivity.  Hemostasis was achieved with electrocautery.  There were 4 areas debrided.  Purulent drainage: absent The patient tolerated the procedure well.  There were no complications. He was allowed to wake up and taken to recovery in stable condition.  EBL: 50 cc's  Drains: VAC  Condition: Tolerated procedure well and Stable  Complications: none.

## 2012-09-05 NOTE — Progress Notes (Signed)
PAtient back from OR post I&D, alert and orientated, denies any pain/distress., wound VAC inplace and active.

## 2012-09-05 NOTE — Anesthesia Postprocedure Evaluation (Signed)
  Anesthesia Post-op Note  Patient: [name] [name]  Procedure(s) Performed: Procedure(s) (LRB): IRRIGATION AND DEBRIDEMENT OF ULCERS WITH ACELL PLACEMENT AND VAC PLACEMENT (N/A)  Patient Location: PACU  Anesthesia Type: General  Level of Consciousness: awake and alert   Airway and Oxygen Therapy: Patient Spontanous Breathing  Post-op Pain: mild  Post-op Assessment: Post-op Vital signs reviewed, Patient's Cardiovascular Status Stable, Respiratory Function Stable, Patent Airway and No signs of Nausea or vomiting  Last Vitals:  Filed Vitals:   09/05/12 1458  BP: 112/68  Pulse: 93  Temp: 36.6 C  Resp: 12    Post-op Vital Signs: stable   Complications: No apparent anesthesia complications

## 2012-09-05 NOTE — Brief Op Note (Signed)
09/01/2012 - 09/05/2012  2:40 PM  PATIENT:  [name]  47 y.o. 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  PROCEDURE:  Procedure(s): IRRIGATION AND DEBRIDEMENT OF ULCERS WITH ACELL PLACEMENT AND VAC PLACEMENT (N/A)  SURGEON:  Surgeon(s) and Role:    * Claire Sanger, DO - Primary  PHYSICIAN ASSISTANT: Shawn Rayburn, PA  ASSISTANTS: none   ANESTHESIA:   general  EBL:  Total I/O In: 250 [IV Piggyback:250] Out: 520 [Urine:470; Blood:50]  BLOOD ADMINISTERED:none  DRAINS: none   LOCAL MEDICATIONS USED:  NONE  SPECIMEN:  Source of Specimen:  right femoral bone and tissue  DISPOSITION OF SPECIMEN:  micro  COUNTS:  YES  TOURNIQUET:  * No tourniquets in log *  DICTATION: .Dragon Dictation  PLAN OF CARE: Admit to inpatient   PATIENT DISPOSITION:  PACU - hemodynamically stable.   Delay start of Pharmacological VTE agent (>24hrs) due to surgical blood loss or risk of bleeding: no
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
## 2012-09-05 NOTE — Progress Notes (Signed)
PATIENT DETAILS Name: [name] [name] Age: 47 y.o. Sex: male Date of Birth: 02-Jun-1965 Admit Date: 09/01/2012 Admitting Physician Houston Siren, MD ZOX:[id number], MD  Subjective: No major complaints overnight. Patient going for surgery today  Assessment/Plan: Active Problems: Sepsis syndrome -complicated patient-recent h/o right septic hip joint, chronic pelvic osteo and sacral decubitus-getting IV Invanz and oral fluconazole-prior to hospitalization-before PICC line dislodged and came off. Per my review of discharge summary from [facility] in Epic-Arthrocentesis under IR guidance done at [facility] was positive for Coag neg staph and Candida Parapsilosis.Patient was discharfged on 4/11-with oral fluconazole and IV Invanz to complete 6 weeks of therapy -Multiple sites of infection-PICC line, chronic foley, sacral wounds, right hip-at this time. But afebrile overnight and decreasing leukocytosis -c/w IV Vanco/Zosyn/Fluconazole- infectious disease directing antibiotic therapy. 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 -all present prior to this admit -as above - Appreciate wound care eval - Appreciate Plastics eval-Dr Sanger -on Zinc Sulfate and Vit C to promote wound healing - Going to the OR today for irrigation and debridement of the wound, with possible Acell and VAC placement  Anemia -presumed 2/2 chronic disease - s/p blood transfusion - hb is 8.3 today   HTN -controlled  -c/w Amlodipine/Lasix  Quad/Chronic Suprapubic catheter/Colostomy -stable    Disposition: Remain inpatient- will need home health services resumed on discharge  DVT Prophylaxis: Prophylactic Lovenox   Code Status: Full code   Procedures:  None  CONSULTS:  ID  PHYSICAL EXAM: Vital signs in last 24 hours: Filed  Vitals:   09/04/12 2335 09/05/12 0654 09/05/12 0946 09/05/12 1028  BP:  109/78 117/66 109/61  Pulse: 105 111  103  Temp:  98.5 F (36.9 C)  98.1 F (36.7 C)  TempSrc:  Oral  Oral  Resp: 18 18  18   Height:      Weight:      SpO2: 100% 100%  100%    Weight change:  Filed Weights   09/01/12 2105 09/03/12 0504  Weight: 103.7 kg (228 lb 9.9 oz) 105.1 kg (231 lb 11.3 oz)   Body mass index is 31.42 kg/(m^2).   Gen Exam: Apear in no acute distress Neck: Supple.   Chest: Clear bilaterally  CVS: S1 S2 Regular, no murmurs.  Abdomen: soft, non tender, non distended,  Colostomy in place Extremities: No edema of lower extremities Neurologic: Quadriplegic Wounds:Stage 4 sacral decubitus  Intake/Output from previous day:  Intake/Output Summary (Last 24 hours) at 09/05/12 1448 Last data filed at 09/05/12 1425  Gross per 24 hour  Intake 1484.83 ml  Output   3420 ml  Net -1935.17 ml     LAB RESULTS: CBC  Recent Labs Lab 09/01/12 1550 09/03/12 0500 09/04/12 0500 09/04/12 2030 09/05/12 0340  WBC 18.4* 14.8* 12.3*  --  12.4*  HGB 8.5* 8.1* 7.4* 8.3* 8.3*  HCT 27.8* 26.4* 24.6* 25.7* 26.0*  PLT 618* 488* 417*  --  396  MCV 77.7* 77.4* 76.6*  --  77.6*  MCH 23.7* 23.8* 23.1*  --  24.8*  MCHC 30.6 30.7 30.1  --  31.9  RDW 19.4* 19.3* 18.7*  --  18.5*  LYMPHSABS 2.4  --   --   --   --   MONOABS 2.1*  --   --   --   --   EOSABS  0.7  --   --   --   --   BASOSABS 0.1  --   --   --   --     Chemistries   Recent Labs Lab 09/01/12 1550 09/03/12 0500 09/04/12 0500 09/05/12 0340  NA 143 142 141 136  K 5.1 4.4 3.8 4.0  CL 109 108 108 103  CO2 22 21 21 24   GLUCOSE 142* 139* 121* 110*  BUN 37* 23 26* 29*  CREATININE 1.12 0.95 1.00 0.83  CALCIUM 10.1 9.7 9.1 9.2    CBG:  Recent Labs Lab 09/03/12 2013  GLUCAP 176*    GFR Estimated Creatinine Clearance: 139.4 ml/min (by C-G formula based on Cr of 0.83).  Coagulation profile No results found for this basename:  INR, PROTIME,  in the last 168 hours  Cardiac Enzymes No results found for this basename: CK, CKMB, TROPONINI, MYOGLOBIN,  in the last 168 hours  No components found with this basename: POCBNP,  No results found for this basename: DDIMER,  in the last 72 hours No results found for this basename: HGBA1C,  in the last 72 hours No results found for this basename: CHOL, HDL, LDLCALC, TRIG, CHOLHDL, LDLDIRECT,  in the last 72 hours No results found for this basename: TSH, T4TOTAL, FREET3, T3FREE, THYROIDAB,  in the last 72 hours No results found for this basename: VITAMINB12, FOLATE, FERRITIN, TIBC, IRON, RETICCTPCT,  in the last 72 hours No results found for this basename: LIPASE, AMYLASE,  in the last 72 hours  Urine Studies No results found for this basename: UACOL, UAPR, USPG, UPH, UTP, UGL, UKET, UBIL, UHGB, UNIT, UROB, ULEU, UEPI, UWBC, URBC, UBAC, CAST, CRYS, UCOM, BILUA,  in the last 72 hours  MICROBIOLOGY: Recent Results (from the past 240 hour(s))  CULTURE, BLOOD (ROUTINE X 2)     Status: None   Collection Time    09/01/12  3:50 PM      Result Value Range Status   Specimen Description BLOOD RIGHT HAND  5 ML IN [facility] BOTTLE   Final   Special Requests NONE   Final   Culture  Setup Time 09/01/2012 19:57   Final   Culture     Final   Value:        BLOOD CULTURE RECEIVED NO GROWTH TO DATE CULTURE WILL BE HELD FOR 5 DAYS BEFORE ISSUING A FINAL NEGATIVE REPORT   Report Status PENDING   Incomplete  URINE CULTURE     Status: None   Collection Time    09/01/12  4:21 PM      Result Value Range Status   Specimen Description URINE, CLEAN CATCH   Final   Special Requests NONE   Final   Culture  Setup Time 09/01/2012 21:23   Final   Colony Count 25,000 COLONIES/ML   Final   Culture     Final   Value: VANCOMYCIN RESISTANT ENTEROCOCCUS ISOLATED     Note: CRITICAL RESULT CALLED TO, READ BACK BY AND VERIFIED WITH: IFY O@9 :45AM ON 09/05/12 BY DANTS   Report Status 09/05/2012 FINAL   Final    Organism ID, Bacteria VANCOMYCIN RESISTANT ENTEROCOCCUS ISOLATED   Final  MRSA PCR SCREENING     Status: None   Collection Time    09/01/12 10:39 PM      Result Value Range Status   MRSA by PCR NEGATIVE  NEGATIVE Final   Comment:            The GeneXpert MRSA Assay (  FDA     approved for NASAL specimens     only), is one component of a     comprehensive MRSA colonization     surveillance program. It is not     intended to diagnose MRSA     infection nor to guide or     monitor treatment for     MRSA infections.    RADIOLOGY STUDIES/RESULTS: Dg Chest Port 1 View  09/01/2012  *RADIOLOGY REPORT*  Clinical Data: Fever, sweating.  PORTABLE CHEST - 1 VIEW  Comparison: 08/01/2012  Findings: Cardiomegaly.  Chronic areas of scarring in the lungs bilaterally.  No acute airspace opacities or effusions.  No acute bony abnormality.  IMPRESSION: Stable chronic changes and cardiomegaly.  No acute findings.   Original Report Authenticated By: Charlett Nose, M.D.    Dg Fluoro Guide Ndl Plc/bx  08/04/2012  *RADIOLOGY REPORT*   Clinical data: Enlarging right hip effusion  RIGHT HIP ASPIRATION UNDER FLUOROSCOPY  Technique: The procedure, risks (including but not limited to bleeding, infection, organ damage), benefits, and alternatives were explained to the patient.  Questions regarding the procedure were encouraged and answered.  The patient understands and consents to the procedure. An appropriate skin entry site was determined under fluoroscopy.  Site was marked, prepped with Betadine, draped in usual sterile fashion, infiltrated locally with 1% lidocaine. A 18 gauge spinal needle was advanced to the right hip joint. 4 ml bloody fluid were aspirated, sent for the requested laboratory studies.  The patient tolerated procedure well, with no immediate complication.  Fluoro time: 16 seconds  IMPRESSION: 1.  Technically successful right hip aspiration  under fluoroscopy   Original Report Authenticated By: D. Andria Rhein, MD     MEDICATIONS: Scheduled Meds: . [MAR HOLD] amLODipine  10 mg Oral Daily  . [facility] HOLD] baclofen  20 mg Oral QID  . [name] HOLD] docusate sodium  100 mg Oral BID  . [MAR HOLD] enoxaparin (LOVENOX) injection  40 mg Subcutaneous QHS  . [MAR HOLD] ertapenem  1 g Intravenous Daily  . [facility] HOLD] famotidine  20 mg Oral BID  . [facility] HOLD] feeding supplement  30 mL Oral BID WC  . [facility] HOLD] ferrous sulfate  325 mg Oral TID WC  . [facility] HOLD] fluconazole  600 mg Oral Daily  . [facility] HOLD] furosemide  20 mg Oral Daily  . [MAR HOLD] lactose free nutrition  237 mL Oral BID BM  . [facility] HOLD] metoCLOPramide  10 mg Oral TID AC  . [MAR HOLD] multivitamin with minerals  1 tablet Oral q morning - 10a  . [facility] HOLD] nutrition supplement  1 packet Oral BID BM  . [facility] HOLD] potassium chloride SA  20 mEq Oral q morning - 10a  . [MAR HOLD] sodium chloride  3 mL Intravenous Q12H  . [facility] HOLD] vitamin C  500 mg Oral BID  . [facility] HOLD] zinc sulfate  220 mg Oral q morning - 10a   Continuous Infusions: . sodium chloride 20 mL/hr at 09/02/12 2300   PRN Meds:.0.9 % irrigation (POUR BTL), [MAR HOLD] albuterol, [MAR HOLD] ondansetron (ZOFRAN) IV, [MAR HOLD] ondansetron, polymyxin / bacitracin (DOUBLE ANTIBIOTIC) irrigation, [MAR HOLD] sodium chloride, sodium chloride irrigation  Antibiotics: Anti-infectives   Start     Dose/Rate Route Frequency Ordered Stop   09/05/12 1331  polymyxin B 500,000 Units, bacitracin 50,000 Units in sodium chloride irrigation 0.9 % 500 mL irrigation       As needed 09/05/12 1331  09/05/12 1100  [MAR Hold]  ertapenem (INVANZ) 1 g in sodium chloride 0.9 % 50 mL IVPB     (On MAR Hold since 09/05/12 1245)   1 g 100 mL/hr over 30 Minutes Intravenous Daily 09/05/12 1006     09/02/12 2200  vancomycin (VANCOCIN) IVPB 1000 mg/200 mL premix  Status:  Discontinued     1,000 mg 200 mL/hr over 60 Minutes Intravenous Every 12 hours 09/02/12 2151 09/05/12 1006   09/02/12 1000  vancomycin  (VANCOCIN) IVPB 1000 mg/200 mL premix  Status:  Discontinued     1,000 mg 200 mL/hr over 60 Minutes Intravenous Every 12 hours 09/02/12 0053 09/02/12 0832   09/01/12 2200  piperacillin-tazobactam (ZOSYN) IVPB 3.375 g  Status:  Discontinued     3.375 g 12.5 mL/hr over 240 Minutes Intravenous 3 times per day 09/01/12 2121 09/05/12 1006   09/01/12 2145  vancomycin (VANCOCIN) 2,000 mg in sodium chloride 0.9 % 500 mL IVPB     2,000 mg 250 mL/hr over 120 Minutes Intravenous  Once 09/01/12 2142 09/02/12 0030   09/01/12 2130  [MAR Hold]  fluconazole (DIFLUCAN) tablet 600 mg     (On MAR Hold since 09/05/12 1245)   600 mg Oral Daily 09/01/12 2121         Meredeth Ide, MD  [facility] Pager:[phone]  If 7PM-7AM, please contact night-coverage [email or web address] Password [facility] 09/05/2012, 2:48 PM   LOS: 4 days

## 2012-09-05 NOTE — Progress Notes (Signed)
CRITICAL VALUE ALERT  Critical value received:  Urine culture positive VREVANCOMYCIN  RESISTANT ENTEROCOCCUS (VRE)  Date of notification:  09/05/12  Time of notification:  0945am  Critical value read back:yes  Nurse who received alert:  Jiles Crocker  MD notified (1st page):  Dr. Sharl Ma  Time of first page:  1000  MD notified (2nd page):  Time of second page:  Responding MD:  1000  Time MD responded:  Dr. Sharl Ma

## 2012-09-05 NOTE — Consult Note (Signed)
I agree with the above note 

## 2012-09-05 NOTE — Transfer of Care (Signed)
Immediate Anesthesia Transfer of Care Note  Patient: [name] [name]  Procedure(s) Performed: Procedure(s): IRRIGATION AND DEBRIDEMENT OF ULCERS WITH ACELL PLACEMENT AND VAC PLACEMENT (N/A)  Patient Location: PACU  Anesthesia Type:General  Level of Consciousness: awake, alert  and oriented  Airway & Oxygen Therapy: Patient Spontanous Breathing and Patient connected to face mask oxygen  Post-op Assessment: Report given to PACU RN and Post -op Vital signs reviewed and stable  Post vital signs: Reviewed and stable  Complications: No apparent anesthesia complications

## 2012-09-05 NOTE — Anesthesia Preprocedure Evaluation (Signed)
Anesthesia Evaluation  Patient identified by MRN, date of birth, ID band Patient awake    Reviewed: Allergy & Precautions, H&P , NPO status , Patient's Chart, lab work & pertinent test results  Airway Mallampati: III TM Distance: >3 FB Neck ROM: Full    Dental  (+) Teeth Intact and Dental Advisory Given   Pulmonary asthma , sleep apnea and Continuous Positive Airway Pressure Ventilation ,  Chronic respiratory failure         Cardiovascular hypertension, + Peripheral Vascular Disease     Neuro/Psych Seizures -,  quadriplegia    GI/Hepatic   Endo/Other  Morbid obesity  Renal/GU      Musculoskeletal   Abdominal   Peds  Hematology  (+) Blood dyscrasia, anemia ,   Anesthesia Other Findings   Reproductive/Obstetrics                           Anesthesia Physical  Anesthesia Plan  ASA: III  Anesthesia Plan: General   Post-op Pain Management:    Induction: Intravenous  Airway Management Planned: Oral ETT and Video Laryngoscope Planned  Additional Equipment:   Intra-op Plan:   Post-operative Plan:   Informed Consent: I have reviewed the patients History and Physical, chart, labs and discussed the procedure including the risks, benefits and alternatives for the proposed anesthesia with the patient or authorized representative who has indicated his/her understanding and acceptance.   Dental advisory given  Plan Discussed with: Anesthesiologist and Surgeon  Anesthesia Plan Comments:         Anesthesia Quick Evaluation

## 2012-09-05 NOTE — Progress Notes (Signed)
Patient ID: [name] [name], male   DOB: September 07, 1965, 47 y.o.   MRN: [id number]         [facility] Infectious Disease    Date of Admission:  09/01/2012   Total days of antibiotics 34         Active Problems:   Sacral decubitus ulcer, stage IV   PVD   Obesity   Quadriplegia   Leukocytosis   Seizure disorder   HTN (hypertension)   S/P colostomy   Suprapubic catheter   Complicated UTI (urinary tract infection)   OSA on CPAP   Sepsis syndrome   . amLODipine  10 mg Oral Daily  . baclofen  20 mg Oral QID  . docusate sodium  100 mg Oral BID  . enoxaparin (LOVENOX) injection  40 mg Subcutaneous QHS  . famotidine  20 mg Oral BID  . feeding supplement  30 mL Oral BID WC  . ferrous sulfate  325 mg Oral TID WC  . fluconazole  600 mg Oral Daily  . furosemide  20 mg Oral Daily  . lactose free nutrition  237 mL Oral BID BM  . metoCLOPramide  10 mg Oral TID AC  . multivitamin with minerals  1 tablet Oral q morning - 10a  . nutrition supplement  1 packet Oral BID BM  . piperacillin-tazobactam  3.375 g Intravenous Q8H  . potassium chloride SA  20 mEq Oral q morning - 10a  . sodium chloride  3 mL Intravenous Q12H  . vancomycin  1,000 mg Intravenous Q12H  . vitamin C  500 mg Oral BID  . zinc sulfate  220 mg Oral q morning - 10a    Subjective: He is feeling well and back to normal.  Objective: Temp:  [98.3 F (36.8 C)-98.7 F (37.1 C)] 98.5 F (36.9 C) (04/23 0654) Pulse Rate:  [77-120] 111 (04/23 0654) Resp:  [16-18] 18 (04/23 0654) BP: (96-137)/(62-78) 117/66 mmHg (04/23 0946) SpO2:  [100 %] 100 % (04/23 0654)  General: Alert, comfortable talking on the phone  Lab Results Lab Results  Component Value Date   WBC 12.4* 09/05/2012   HGB 8.3* 09/05/2012   HCT 26.0* 09/05/2012   MCV 77.6* 09/05/2012   PLT 396 09/05/2012    Lab Results  Component Value Date   CREATININE 0.83 09/05/2012   BUN 29* 09/05/2012   NA 136 09/05/2012   K 4.0 09/05/2012   CL 103 09/05/2012   CO2 24  09/05/2012    Lab Results  Component Value Date   ALT 30 09/05/2012   AST 27 09/05/2012   ALKPHOS 102 09/05/2012   BILITOT 0.1* 09/05/2012      Microbiology: Recent Results (from the past 240 hour(s))  CULTURE, BLOOD (ROUTINE X 2)     Status: None   Collection Time    09/01/12  3:50 PM      Result Value Range Status   Specimen Description BLOOD RIGHT HAND  5 ML IN [facility] BOTTLE   Final   Special Requests NONE   Final   Culture  Setup Time 09/01/2012 19:57   Final   Culture     Final   Value:        BLOOD CULTURE RECEIVED NO GROWTH TO DATE CULTURE WILL BE HELD FOR 5 DAYS BEFORE ISSUING A FINAL NEGATIVE REPORT   Report Status PENDING   Incomplete  URINE CULTURE     Status: None   Collection Time    09/01/12  4:21 PM  Result Value Range Status   Specimen Description URINE, CLEAN CATCH   Final   Special Requests NONE   Final   Culture  Setup Time 09/01/2012 21:23   Final   Colony Count 25,000 COLONIES/ML   Final   Culture     Final   Value: VANCOMYCIN RESISTANT ENTEROCOCCUS ISOLATED     Note: CRITICAL RESULT CALLED TO, READ BACK BY AND VERIFIED WITH: IFY O@9 :45AM ON 09/05/12 BY DANTS   Report Status 09/05/2012 FINAL   Final   Organism ID, Bacteria VANCOMYCIN RESISTANT ENTEROCOCCUS ISOLATED   Final  MRSA PCR SCREENING     Status: None   Collection Time    09/01/12 10:39 PM      Result Value Range Status   MRSA by PCR NEGATIVE  NEGATIVE Final   Comment:            The GeneXpert MRSA Assay (FDA     approved for NASAL specimens     only), is one component of a     comprehensive MRSA colonization     surveillance program. It is not     intended to diagnose MRSA     infection nor to guide or     monitor treatment for     MRSA infections.   Assessment: He has not shown any signs of new, active infection during this hospitalization. I suspect that the VRE in the latest urine culture is an insignificant colonizer and will not add therapy for that. I favor changing back to the IV  ertapenem and fluconazole of that he was on prior to admission. He is due to go to the OR today for more debridement of his complex, chronic sacral wounds.  Plan: 1. Continue fluconazole 2. Change vancomycin and piperacillin tazobactam to ertapenem  Cliffton Asters, MD [facility] for Infectious Disease [facility] [phone] pager   [phone] cell 09/05/2012, 10:03 AM

## 2012-09-06 ENCOUNTER — Encounter (HOSPITAL_COMMUNITY): Payer: Self-pay | Admitting: Plastic Surgery

## 2012-09-06 DIAGNOSIS — D509 Iron deficiency anemia, unspecified: Secondary | ICD-10-CM

## 2012-09-06 DIAGNOSIS — L8990 Pressure ulcer of unspecified site, unspecified stage: Secondary | ICD-10-CM

## 2012-09-06 DIAGNOSIS — L8993 Pressure ulcer of unspecified site, stage 3: Secondary | ICD-10-CM

## 2012-09-06 DIAGNOSIS — B957 Other staphylococcus as the cause of diseases classified elsewhere: Secondary | ICD-10-CM

## 2012-09-06 LAB — BASIC METABOLIC PANEL
CO2: 23 mEq/L (ref 19–32)
Calcium: 9 mg/dL (ref 8.4–10.5)
Glucose, Bld: 108 mg/dL — ABNORMAL HIGH (ref 70–99)
Sodium: 137 mEq/L (ref 135–145)

## 2012-09-06 LAB — CBC
HCT: 23.9 % — ABNORMAL LOW (ref 39.0–52.0)
Hemoglobin: 7.6 g/dL — ABNORMAL LOW (ref 13.0–17.0)
MCH: 24.9 pg — ABNORMAL LOW (ref 26.0–34.0)
MCV: 78.4 fL (ref 78.0–100.0)
RBC: 3.05 MIL/uL — ABNORMAL LOW (ref 4.22–5.81)

## 2012-09-06 NOTE — Progress Notes (Signed)
Patient ID: [name] [name], male   DOB: Jun 02, 1965, 47 y.o.   MRN: [id number]         [facility] for Infectious Disease    Date of Admission:  09/01/2012     Active Problems:   Sacral decubitus ulcer, stage IV   PVD   Obesity   Quadriplegia   Leukocytosis   Seizure disorder   HTN (hypertension)   S/P colostomy   Suprapubic catheter   Complicated UTI (urinary tract infection)   OSA on CPAP   Sepsis syndrome   . amLODipine  10 mg Oral Daily  . baclofen  20 mg Oral QID  . docusate sodium  100 mg Oral BID  . enoxaparin (LOVENOX) injection  40 mg Subcutaneous QHS  . ertapenem  1 g Intravenous Daily  . famotidine  20 mg Oral BID  . feeding supplement  30 mL Oral BID WC  . ferrous sulfate  325 mg Oral TID WC  . fluconazole  600 mg Oral Daily  . furosemide  20 mg Oral Daily  . lactose free nutrition  237 mL Oral BID BM  . metoCLOPramide  10 mg Oral TID AC  . multivitamin with minerals  1 tablet Oral q morning - 10a  . nutrition supplement  1 packet Oral BID BM  . potassium chloride SA  20 mEq Oral q morning - 10a  . sodium chloride  3 mL Intravenous Q12H  . vitamin C  500 mg Oral BID  . zinc sulfate  220 mg Oral q morning - 10a    Objective: Temp:  [98.3 F (36.8 C)-99.9 F (37.7 C)] 99.9 F (37.7 C) (04/24 1557) Pulse Rate:  [95-109] 106 (04/24 1557) Resp:  [16-18] 18 (04/24 1557) BP: (110-118)/(61-73) 110/71 mmHg (04/24 1557) SpO2:  [99 %-100 %] 100 % (04/24 1557)  Assessment: I spoke to Dr. Sharl Ma who says that he'll be ready for discharge tomorrow. I will continue IV ertapenem and oral fluconazole. Upon review of his discharge summary from [facility] the plan was to continue that regimen for a full 6 weeks through may 23rd and consider conversion to an oral antibiotic regimen. I will arrange a followup in our clinic.  Plan: 1. Continue IV ertapenem and oral fluconazole through May 23 2. I will arrange followup in our clinic  Cliffton Asters, MD [facility] for Infectious Disease [facility] [phone] pager   [phone] cell 09/06/2012, 4:38 PM

## 2012-09-06 NOTE — Progress Notes (Addendum)
PATIENT DETAILS Name: [name] [name] Age: 47 y.o. Sex: male Date of Birth: 03/12/1966 Admit Date: 09/01/2012 Admitting Physician Houston Siren, MD RUE:[id number],XBJYN N, MD  Subjective: No major complaints overnight. S/p incision and drainage of the sacral and ischial ulcer.  Assessment/Plan: Active Problems: Sepsis syndrome -complicated patient-recent h/o right septic hip joint, chronic pelvic osteo and sacral decubitus-getting IV Invanz and oral fluconazole-prior to hospitalization-before PICC line dislodged and came off. Per my review of discharge summary from [facility] in Epic-Arthrocentesis under IR guidance done at [facility] was positive for Coag neg staph and Candida Parapsilosis.Patient was discharfged on 4/11-with oral fluconazole and IV Invanz to complete 6 weeks of therapy -Multiple sites of infection-PICC line, chronic foley, sacral wounds, right hip-at this time. But afebrile overnight and decreasing leukocytosis -c/w IV Vanco/Zosyn/Fluconazole- infectious disease directing antibiotic therapy. 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  Anemia -presumed 2/2 chronic disease - s/p blood transfusion - hb is 7.6 - Will check CBC in am   HTN -controlled  -c/w Amlodipine/Lasix  Quad/Chronic Suprapubic catheter/Colostomy -stable    Disposition: Remain inpatient- will need home health services resumed on discharge  DVT Prophylaxis: Prophylactic Lovenox   Code Status: Full code   Procedures:  None  CONSULTS:  ID  PHYSICAL EXAM: Vital signs in last 24 hours: Filed Vitals:    09/05/12 1610 09/05/12 1800 09/05/12 2100 09/06/12 0520  BP: 85/56 118/73 117/61 117/72  Pulse: 94 95 103 109  Temp: 97.7 F (36.5 C)  98.3 F (36.8 C) 98.7 F (37.1 C)  TempSrc:   Oral Oral  Resp: 12  16 16   Height:      Weight:      SpO2: 100%  99% 100%    Weight change:  Filed Weights   09/01/12 2105 09/03/12 0504  Weight: 103.7 kg (228 lb 9.9 oz) 105.1 kg (231 lb 11.3 oz)   Body mass index is 31.42 kg/(m^2).   Gen Exam: Apear in no acute distress Neck: Supple.   Chest: Clear bilaterally  CVS: S1 S2 Regular, no murmurs.  Abdomen: soft, non tender, non distended,  Colostomy in place Extremities: No edema of lower extremities Neurologic: Quadriplegic Wounds:Stage 4 sacral decubitus  Intake/Output from previous day:  Intake/Output Summary (Last 24 hours) at 09/06/12 1153 Last data filed at 09/06/12 0900  Gross per 24 hour  Intake   2080 ml  Output   1570 ml  Net    510 ml     LAB RESULTS: CBC  Recent Labs Lab 09/01/12 1550 09/03/12 0500 09/04/12 0500 09/04/12 2030 09/05/12 0340 09/06/12 0540  WBC 18.4* 14.8* 12.3*  --  12.4* 12.7*  HGB 8.5* 8.1* 7.4* 8.3* 8.3* 7.6*  HCT 27.8* 26.4* 24.6* 25.7* 26.0* 23.9*  PLT 618* 488* 417*  --  396 384  MCV 77.7* 77.4* 76.6*  --  77.6* 78.4  MCH 23.7* 23.8* 23.1*  --  24.8* 24.9*  MCHC 30.6 30.7 30.1  --  31.9 31.8  RDW 19.4* 19.3* 18.7*  --  18.5* 18.9*  LYMPHSABS 2.4  --   --   --   --   --  MONOABS 2.1*  --   --   --   --   --   EOSABS 0.7  --   --   --   --   --   BASOSABS 0.1  --   --   --   --   --     Chemistries   Recent Labs Lab 09/01/12 1550 09/03/12 0500 09/04/12 0500 09/05/12 0340 09/06/12 0540  NA 143 142 141 136 137  K 5.1 4.4 3.8 4.0 4.1  CL 109 108 108 103 104  CO2 22 21 21 24 23   GLUCOSE 142* 139* 121* 110* 108*  BUN 37* 23 26* 29* 21  CREATININE 1.12 0.95 1.00 0.83 0.86  CALCIUM 10.1 9.7 9.1 9.2 9.0    CBG:  Recent Labs Lab 09/03/12 2013  GLUCAP 176*    GFR Estimated  Creatinine Clearance: 134.5 ml/min (by C-G formula based on Cr of 0.86).  Coagulation profile No results found for this basename: INR, PROTIME,  in the last 168 hours  Cardiac Enzymes No results found for this basename: CK, CKMB, TROPONINI, MYOGLOBIN,  in the last 168 hours  No components found with this basename: POCBNP,  No results found for this basename: DDIMER,  in the last 72 hours No results found for this basename: HGBA1C,  in the last 72 hours No results found for this basename: CHOL, HDL, LDLCALC, TRIG, CHOLHDL, LDLDIRECT,  in the last 72 hours No results found for this basename: TSH, T4TOTAL, FREET3, T3FREE, THYROIDAB,  in the last 72 hours No results found for this basename: VITAMINB12, FOLATE, FERRITIN, TIBC, IRON, RETICCTPCT,  in the last 72 hours No results found for this basename: LIPASE, AMYLASE,  in the last 72 hours  Urine Studies No results found for this basename: UACOL, UAPR, USPG, UPH, UTP, UGL, UKET, UBIL, UHGB, UNIT, UROB, ULEU, UEPI, UWBC, URBC, UBAC, CAST, CRYS, UCOM, BILUA,  in the last 72 hours  MICROBIOLOGY: Recent Results (from the past 240 hour(s))  CULTURE, BLOOD (ROUTINE X 2)     Status: None   Collection Time    09/01/12  3:50 PM      Result Value Range Status   Specimen Description BLOOD RIGHT HAND  5 ML IN [facility] BOTTLE   Final   Special Requests NONE   Final   Culture  Setup Time 09/01/2012 19:57   Final   Culture     Final   Value:        BLOOD CULTURE RECEIVED NO GROWTH TO DATE CULTURE WILL BE HELD FOR 5 DAYS BEFORE ISSUING A FINAL NEGATIVE REPORT   Report Status PENDING   Incomplete  URINE CULTURE     Status: None   Collection Time    09/01/12  4:21 PM      Result Value Range Status   Specimen Description URINE, CLEAN CATCH   Final   Special Requests NONE   Final   Culture  Setup Time 09/01/2012 21:23   Final   Colony Count 25,000 COLONIES/ML   Final   Culture     Final   Value: VANCOMYCIN RESISTANT ENTEROCOCCUS ISOLATED     Note:  CRITICAL RESULT CALLED TO, READ BACK BY AND VERIFIED WITH: IFY O@9 :45AM ON 09/05/12 BY DANTS   Report Status 09/05/2012 FINAL   Final   Organism ID, Bacteria VANCOMYCIN RESISTANT ENTEROCOCCUS ISOLATED   Final  MRSA PCR SCREENING     Status: None   Collection Time    09/01/12 10:39 PM  Result Value Range Status   MRSA by PCR NEGATIVE  NEGATIVE Final   Comment:            The GeneXpert MRSA Assay (FDA     approved for NASAL specimens     only), is one component of a     comprehensive MRSA colonization     surveillance program. It is not     intended to diagnose MRSA     infection nor to guide or     monitor treatment for     MRSA infections.  TISSUE CULTURE     Status: None   Collection Time    09/05/12  1:27 PM      Result Value Range Status   Specimen Description HIP RIGHT FEMORAL HEAD   Final   Special Requests NONE   Final   Gram Stain PENDING   Incomplete   Culture NO GROWTH   Final   Report Status PENDING   Incomplete  ANAEROBIC CULTURE     Status: None   Collection Time    09/05/12  1:27 PM      Result Value Range Status   Specimen Description HIP RIGHT FEMORAL HEAD   Final   Special Requests NONE   Final   Gram Stain PENDING   Incomplete   Culture     Final   Value: NO ANAEROBES ISOLATED; CULTURE IN PROGRESS FOR 5 DAYS   Report Status PENDING   Incomplete  TISSUE CULTURE     Status: None   Collection Time    09/05/12  1:53 PM      Result Value Range Status   Specimen Description HIP RIGHT ULCER TISSUE   Final   Special Requests NONE   Final   Gram Stain     Final   Value: MODERATE WBC PRESENT,BOTH PMN AND MONONUCLEAR     NO ORGANISMS SEEN   Culture NO GROWTH   Final   Report Status PENDING   Incomplete  ANAEROBIC CULTURE     Status: None   Collection Time    09/05/12  1:53 PM      Result Value Range Status   Specimen Description HIP RIGHT HIP ULCER TISSUE   Final   Special Requests NONE   Final   Gram Stain     Final   Value: MODERATE WBC PRESENT,BOTH  PMN AND MONONUCLEAR     NO ORGANISMS SEEN   Culture     Final   Value: NO ANAEROBES ISOLATED; CULTURE IN PROGRESS FOR 5 DAYS   Report Status PENDING   Incomplete    RADIOLOGY STUDIES/RESULTS: Dg Chest Port 1 View  09/01/2012  *RADIOLOGY REPORT*  Clinical Data: Fever, sweating.  PORTABLE CHEST - 1 VIEW  Comparison: 08/01/2012  Findings: Cardiomegaly.  Chronic areas of scarring in the lungs bilaterally.  No acute airspace opacities or effusions.  No acute bony abnormality.  IMPRESSION: Stable chronic changes and cardiomegaly.  No acute findings.   Original Report Authenticated By: Charlett Nose, M.D.    Dg Fluoro Guide Ndl Plc/bx  08/04/2012  *RADIOLOGY REPORT*   Clinical data: Enlarging right hip effusion  RIGHT HIP ASPIRATION UNDER FLUOROSCOPY  Technique: The procedure, risks (including but not limited to bleeding, infection, organ damage), benefits, and alternatives were explained to the patient.  Questions regarding the procedure were encouraged and answered.  The patient understands and consents to the procedure. An appropriate skin entry site was determined under fluoroscopy.  Site was marked, prepped with Betadine,  draped in usual sterile fashion, infiltrated locally with 1% lidocaine. A 18 gauge spinal needle was advanced to the right hip joint. 4 ml bloody fluid were aspirated, sent for the requested laboratory studies.  The patient tolerated procedure well, with no immediate complication.  Fluoro time: 16 seconds  IMPRESSION: 1.  Technically successful right hip aspiration  under fluoroscopy   Original Report Authenticated By: D. Andria Rhein, MD     MEDICATIONS: Scheduled Meds: . amLODipine  10 mg Oral Daily  . baclofen  20 mg Oral QID  . docusate sodium  100 mg Oral BID  . enoxaparin (LOVENOX) injection  40 mg Subcutaneous QHS  . ertapenem  1 g Intravenous Daily  . famotidine  20 mg Oral BID  . feeding supplement  30 mL Oral BID WC  . ferrous sulfate  325 mg Oral TID WC  . fluconazole   600 mg Oral Daily  . furosemide  20 mg Oral Daily  . lactose free nutrition  237 mL Oral BID BM  . metoCLOPramide  10 mg Oral TID AC  . multivitamin with minerals  1 tablet Oral q morning - 10a  . nutrition supplement  1 packet Oral BID BM  . potassium chloride SA  20 mEq Oral q morning - 10a  . sodium chloride  3 mL Intravenous Q12H  . vitamin C  500 mg Oral BID  . zinc sulfate  220 mg Oral q morning - 10a   Continuous Infusions: . sodium chloride 20 mL/hr at 09/02/12 2300   PRN Meds:.albuterol, fentaNYL, meperidine (DEMEROL) injection, ondansetron (ZOFRAN) IV, ondansetron, promethazine, sodium chloride  Antibiotics: Anti-infectives   Start     Dose/Rate Route Frequency Ordered Stop   09/05/12 1331  polymyxin B 500,000 Units, bacitracin 50,000 Units in sodium chloride irrigation 0.9 % 500 mL irrigation  Status:  Discontinued       As needed 09/05/12 1331 09/05/12 1449   09/05/12 1100  ertapenem (INVANZ) 1 g in sodium chloride 0.9 % 50 mL IVPB     1 g 100 mL/hr over 30 Minutes Intravenous Daily 09/05/12 1006     09/02/12 2200  vancomycin (VANCOCIN) IVPB 1000 mg/200 mL premix  Status:  Discontinued     1,000 mg 200 mL/hr over 60 Minutes Intravenous Every 12 hours 09/02/12 2151 09/05/12 1006   09/02/12 1000  vancomycin (VANCOCIN) IVPB 1000 mg/200 mL premix  Status:  Discontinued     1,000 mg 200 mL/hr over 60 Minutes Intravenous Every 12 hours 09/02/12 0053 09/02/12 0832   09/01/12 2200  piperacillin-tazobactam (ZOSYN) IVPB 3.375 g  Status:  Discontinued     3.375 g 12.5 mL/hr over 240 Minutes Intravenous 3 times per day 09/01/12 2121 09/05/12 1006   09/01/12 2145  vancomycin (VANCOCIN) 2,000 mg in sodium chloride 0.9 % 500 mL IVPB     2,000 mg 250 mL/hr over 120 Minutes Intravenous  Once 09/01/12 2142 09/02/12 0030   09/01/12 2130  fluconazole (DIFLUCAN) tablet 600 mg     600 mg Oral Daily 09/01/12 2121         Meredeth Ide, MD  [facility] Pager:[phone]  [phone]  If 7PM-7AM, please contact night-coverage [email or web address] Password [facility] 09/06/2012, 11:53 AM   LOS: 5 days

## 2012-09-07 LAB — CULTURE, BLOOD (ROUTINE X 2): Culture: NO GROWTH

## 2012-09-07 MED ORDER — HEPARIN SOD (PORK) LOCK FLUSH 100 UNIT/ML IV SOLN
250.0000 [IU] | INTRAVENOUS | Status: DC | PRN
  Administered 2012-09-07: 250 [IU]

## 2012-09-07 MED ORDER — ALTEPLASE 2 MG IJ SOLR
2.0000 mg | Freq: Once | INTRAMUSCULAR | Status: AC
  Administered 2012-09-07: 2 mg
  Filled 2012-09-07 (×2): qty 2

## 2012-09-07 MED ORDER — SODIUM CHLORIDE 0.9 % IV SOLN: 1.0000 g | Freq: Every day | INTRAVENOUS | Status: DC

## 2012-09-07 MED ORDER — FLUCONAZOLE 200 MG PO TABS: 600.0000 mg | ORAL_TABLET | Freq: Every day | ORAL | Status: DC

## 2012-09-07 MED ORDER — HEPARIN SOD (PORK) LOCK FLUSH 100 UNIT/ML IV SOLN
250.0000 [IU] | Freq: Every day | INTRAVENOUS | Status: DC
  Administered 2012-09-07: 250 [IU]
  Filled 2012-09-07: qty 3
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
## 2012-09-08 LAB — TYPE AND SCREEN: Unit division: 0

## 2012-09-09 LAB — TISSUE CULTURE

## 2012-09-10 ENCOUNTER — Encounter (HOSPITAL_BASED_OUTPATIENT_CLINIC_OR_DEPARTMENT_OTHER): Payer: Medicare Other | Attending: Plastic Surgery

## 2012-09-10 DIAGNOSIS — L89309 Pressure ulcer of unspecified buttock, unspecified stage: Secondary | ICD-10-CM | POA: Insufficient documentation

## 2012-09-10 DIAGNOSIS — L8994 Pressure ulcer of unspecified site, stage 4: Secondary | ICD-10-CM | POA: Insufficient documentation

## 2012-09-10 LAB — TISSUE CULTURE

## 2012-09-10 LAB — ANAEROBIC CULTURE

## 2012-09-17 ENCOUNTER — Encounter (HOSPITAL_BASED_OUTPATIENT_CLINIC_OR_DEPARTMENT_OTHER): Payer: Medicare Other | Attending: Plastic Surgery

## 2012-09-17 DIAGNOSIS — L98499 Non-pressure chronic ulcer of skin of other sites with unspecified severity: Secondary | ICD-10-CM | POA: Insufficient documentation

## 2012-09-17 DIAGNOSIS — M869 Osteomyelitis, unspecified: Secondary | ICD-10-CM | POA: Insufficient documentation

## 2012-09-18 NOTE — Progress Notes (Signed)
[facility]  NAME:  [name] [name], [name] [name] NO.:  [id number]  MEDICAL RECORD NO.:  [id number]      DATE OF BIRTH:  1966-01-01  PHYSICIAN:  Wayland Denis, DO       VISIT DATE:  09/17/2012                                  OFFICE VISIT   The patient is a 47 year old gentleman who is here for followup after surgical irrigation and debridement of multiple sacral and ischial ulcers.  He is now back at home.  There has been no change in his medications or social history.  He is still on the IV antibiotics for the osteomyelitis.  The areas do appear to have gotten a little bit better with the ACell.  The recommendation is for wet-to-dry dressings on the right and continue VAC on the left.  The right leg was debrided from an eschar and those were noted that included skin and subcutaneous tissue.  He has a blister on the right.  The left knee also has an ulcer and it has been there for some time.  We will use collagen on the knee and wet-to-dry on the right leg.  Continue with protein, vitamin C, zinc, multivitamins, offloading, positioning, turning.  We will see him back in a month.     Wayland Denis, DO     CS/MEDQ  D:  09/17/2012  T:  09/18/2012  Job:  [id number]

## 2012-09-27 ENCOUNTER — Telehealth: Payer: Self-pay | Admitting: *Deleted

## 2012-09-27 NOTE — Telephone Encounter (Signed)
Stop date to be determined after [facility] appt w. Dr Ninetta Lights 10/03/12.  Notified [facility].

## 2012-10-02 ENCOUNTER — Telehealth: Payer: Self-pay | Admitting: *Deleted

## 2012-10-02 NOTE — Telephone Encounter (Signed)
You may need to ask Dr Orvan Falconer about this

## 2012-10-02 NOTE — Telephone Encounter (Signed)
Stop date for antibiotics

## 2012-10-02 NOTE — Telephone Encounter (Signed)
[facility] notified that stop date for IV antibiotics to be decided at office visit 10/03/12. However he rescheduled until 10/22/12, please advise on stop date [name] [name]

## 2012-10-03 ENCOUNTER — Ambulatory Visit: Payer: Medicare Other | Admitting: Infectious Diseases

## 2012-10-04 ENCOUNTER — Telehealth: Payer: Self-pay | Admitting: *Deleted

## 2012-10-04 NOTE — Telephone Encounter (Signed)
By inpatient note from a April 24 indicated that his antibiotics stopped date is May 23.

## 2012-10-04 NOTE — Telephone Encounter (Signed)
[facility] nurse, [name] notified Wendall Mola

## 2012-10-04 NOTE — Telephone Encounter (Signed)
When will IV ABX stop? Pt. canceled appt for yesterday for [facility] w/ Dr. Ninetta Lights.  Pt has [facility] appt w/ Dr Ninetta Lights for 10/22/12 for [facility].  IV ABX will continue until the pt is seen for this visit.  Pt verbalized understanding.  [facility] informed of continuation.

## 2012-10-15 ENCOUNTER — Encounter (HOSPITAL_BASED_OUTPATIENT_CLINIC_OR_DEPARTMENT_OTHER): Payer: Medicare Other | Attending: Plastic Surgery

## 2012-10-15 DIAGNOSIS — L98499 Non-pressure chronic ulcer of skin of other sites with unspecified severity: Secondary | ICD-10-CM | POA: Insufficient documentation

## 2012-10-15 DIAGNOSIS — L97809 Non-pressure chronic ulcer of other part of unspecified lower leg with unspecified severity: Secondary | ICD-10-CM | POA: Insufficient documentation

## 2012-10-15 DIAGNOSIS — R61 Generalized hyperhidrosis: Secondary | ICD-10-CM | POA: Insufficient documentation

## 2012-10-16 NOTE — Progress Notes (Signed)
[facility]  NAME:  [name] [name], [name] [name] NO.:  [id number]  MEDICAL RECORD NO.:  [id number]      DATE OF BIRTH:  12/12/1965  PHYSICIAN:  Wayland Denis, DO       VISIT DATE:  10/15/2012                                  OFFICE VISIT   The patient is a 47 year old gentleman who is here for followup on his multiple sacral ischial ulcers and left knee ulcer.  He underwent debridement in the OR with placement of ACell and the VAC on the sacral ulcer.  All areas are looking much healthier.  He has got some excoriation where it looks like he is being let slide on the surface, but he also is sweating profusely, but he otherwise feels okay.  He is afebrile.  On exam, he is alert, oriented, and cooperative, very pleasant like usual.  He is in agreement to hold the [facility] and go to wet-to-dry dressings.  He is also willing to do inpatient or long-term facility for period of time to get these healed up.  We will also check his prealbumin and see if he is at any way ready for flap.     Wayland Denis, DO     CS/MEDQ  D:  10/15/2012  T:  10/16/2012  Job:  [id number]

## 2012-10-22 ENCOUNTER — Ambulatory Visit (INDEPENDENT_AMBULATORY_CARE_PROVIDER_SITE_OTHER): Payer: Medicare Other | Admitting: Infectious Diseases

## 2012-10-22 ENCOUNTER — Encounter: Payer: Self-pay | Admitting: Infectious Diseases

## 2012-10-22 VITALS — BP 134/87 | HR 105 | Temp 97.9°F | Ht 72.0 in

## 2012-10-22 DIAGNOSIS — L89154 Pressure ulcer of sacral region, stage 4: Secondary | ICD-10-CM

## 2012-10-22 DIAGNOSIS — L8994 Pressure ulcer of unspecified site, stage 4: Secondary | ICD-10-CM

## 2012-10-22 DIAGNOSIS — G8250 Quadriplegia, unspecified: Secondary | ICD-10-CM

## 2012-10-22 DIAGNOSIS — L89109 Pressure ulcer of unspecified part of back, unspecified stage: Secondary | ICD-10-CM

## 2012-10-22 NOTE — Assessment & Plan Note (Signed)
He has hospital bed at home, will defer to wound care center to f/u his nutrition/alb levels (last 2.4 on 09-05-12). Will stop his anbx today, pull his PIC. He is not aware of any further imaging of his wound, will defer this to his WOC and plastic f/u.  Will ask the WOC and Dr Kelly Splinter let us know if he clinically changes. rtc prn

## 2012-10-22 NOTE — Assessment & Plan Note (Signed)
He asks about being restarted on his previous dose of baclofen, as he is now stopping diflucan. I asked him to f/u with PCP.
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
## 2012-10-22 NOTE — Progress Notes (Signed)
Per Dr Ninetta Lights, PICC removed from patient's left arm.  52 cm PICC with four sutures intact. PICC site unremarkable. Sutures removed area cleansed with chlorhexidine and petroleum dressing applied.  Pt advised no heavy lifting and dressing should remain for 24 hours.  Pt tolerated procedure well.     Laurell Josephs, RN

## 2012-10-23 ENCOUNTER — Telehealth: Payer: Self-pay | Admitting: Oncology

## 2012-10-23 NOTE — Telephone Encounter (Signed)
LVOM FOR PT TO RETURN CALL IN RE NP APPT.  °

## 2012-10-26 ENCOUNTER — Telehealth: Payer: Self-pay | Admitting: Oncology

## 2012-10-26 NOTE — Telephone Encounter (Signed)
S/W PT IN RE NP APPT 06/20 @ 1 W/DR. HA REFERRING BEVERLY BROWN, FNP-C DX- ABN PRECIPITOUS DROP IN HEMOCRIT WELCOME PACKET MAILED.

## 2012-10-26 NOTE — Telephone Encounter (Signed)
C/D 10/26/12 for appt. 11/02/12

## 2012-10-31 ENCOUNTER — Encounter: Payer: Self-pay | Admitting: Internal Medicine

## 2012-11-02 ENCOUNTER — Encounter: Payer: Self-pay | Admitting: Oncology

## 2012-11-02 ENCOUNTER — Ambulatory Visit: Payer: Medicare Other | Admitting: Oncology

## 2012-11-02 ENCOUNTER — Other Ambulatory Visit (HOSPITAL_BASED_OUTPATIENT_CLINIC_OR_DEPARTMENT_OTHER): Payer: Medicare Other | Admitting: Lab

## 2012-11-02 ENCOUNTER — Ambulatory Visit: Payer: Medicare Other

## 2012-11-02 DIAGNOSIS — D649 Anemia, unspecified: Secondary | ICD-10-CM

## 2012-11-02 LAB — CBC & DIFF AND RETIC
Basophils Absolute: 0 10*3/uL (ref 0.0–0.1)
EOS%: 2 % (ref 0.0–7.0)
LYMPH%: 10.4 % — ABNORMAL LOW (ref 14.0–49.0)
MCH: 22.7 pg — ABNORMAL LOW (ref 27.2–33.4)
MCV: 76.1 fL — ABNORMAL LOW (ref 79.3–98.0)
MONO%: 10.5 % (ref 0.0–14.0)
Platelets: 582 10*3/uL — ABNORMAL HIGH (ref 140–400)
RBC: 3.22 10*6/uL — ABNORMAL LOW (ref 4.20–5.82)
RDW: 16.5 % — ABNORMAL HIGH (ref 11.0–14.6)
Retic %: 2.68 % — ABNORMAL HIGH (ref 0.80–1.80)
nRBC: 0 % (ref 0–0)

## 2012-11-02 LAB — MORPHOLOGY
PLT EST: INCREASED
Platelet Morphology: INCREASED

## 2012-11-02 NOTE — Progress Notes (Signed)
Patient was 45 min late to a new patient visit.  Reschedule to next week.

## 2012-11-02 NOTE — Progress Notes (Signed)
Checked in new patient. No financial issues. The patient gave me the ok to sign his names to all forms. He wants all communication via email. He has both medical POA/living will.

## 2012-11-05 NOTE — Progress Notes (Signed)
[facility]  NAME:  CHENEY, EWART NO.:  [id number]  MEDICAL RECORD NO.:  [id number]      DATE OF BIRTH:  08-12-1965  PHYSICIAN:  Wayland Denis, DO            VISIT DATE:                                  OFFICE VISIT   The patient is a 47 year old male who is here for followup on his sacral ulcers.  It looks like he has lost a little bit of weight.  He says that he is eating as much as he was before, but he is eating better and less fried food.  Home Health is coming once a week to help with the dressing change and his health care provider is doing the dressings once a day wet to dry.  He still has having quite a bit of sweating and overall, he is otherwise doing well.  On exam, he is alert, oriented, cooperative, not in any acute distress. He is very pleasant and he seems to be a good historian.  His breathing is unlabored.  His heart is regular.  His abdomen is large, but nontender.  The wound had a bit of necrotic tissue, which is concerning that __________ on it, perhaps or not getting nutrition he needs, this was debrided and hemostasis was achieved with pressure.  He was instructed that if he is feeling __________, fever or otherwise ill to go to the emergency room.  If he can get admitted, we can do some more debridement and possibly get him in to an extended care facility for offloading and nutrition and wound care.  He will not be eligible for a VAC until his pre-albumin is elevated and it is already going to be a difficult situation because he has had flaps in the past.  Offloading is certainly important as well.     Wayland Denis, DO     CS/MEDQ  D:  11/05/2012  T:  11/05/2012  Job:  [id number]

## 2012-11-06 LAB — PROTEIN ELECTROPHORESIS, SERUM
Gamma Globulin: 21.2 % — ABNORMAL HIGH (ref 11.1–18.8)
Total Protein, Serum Electrophoresis: 8.1 g/dL (ref 6.0–8.3)

## 2012-11-06 LAB — HEMOGLOBINOPATHY EVALUATION
Hgb A2 Quant: 2.4 % (ref 2.2–3.2)
Hgb A: 97.6 % (ref 96.8–97.8)
Hgb F Quant: 0 % (ref 0.0–2.0)

## 2012-11-06 LAB — KAPPA/LAMBDA LIGHT CHAINS
Kappa free light chain: 5.24 mg/dL — ABNORMAL HIGH (ref 0.33–1.94)
Lambda Free Lght Chn: 3.99 mg/dL — ABNORMAL HIGH (ref 0.57–2.63)

## 2012-11-07 ENCOUNTER — Inpatient Hospital Stay (HOSPITAL_COMMUNITY): Payer: Medicare Other

## 2012-11-07 ENCOUNTER — Inpatient Hospital Stay (HOSPITAL_COMMUNITY)
Admission: EM | Admit: 2012-11-07 | Discharge: 2012-11-20 | DRG: 853 | Disposition: A | Discharge status: Skilled Nursing Facility | Payer: Medicare Other | Attending: Internal Medicine | Admitting: Internal Medicine

## 2012-11-07 ENCOUNTER — Encounter (HOSPITAL_COMMUNITY): Payer: Self-pay | Admitting: Emergency Medicine

## 2012-11-07 DIAGNOSIS — L89154 Pressure ulcer of sacral region, stage 4: Secondary | ICD-10-CM

## 2012-11-07 DIAGNOSIS — E662 Morbid (severe) obesity with alveolar hypoventilation: Secondary | ICD-10-CM | POA: Diagnosis present

## 2012-11-07 DIAGNOSIS — D72829 Elevated white blood cell count, unspecified: Secondary | ICD-10-CM

## 2012-11-07 DIAGNOSIS — Z1612 Extended spectrum beta lactamase (ESBL) resistance: Secondary | ICD-10-CM

## 2012-11-07 DIAGNOSIS — Z9359 Other cystostomy status: Secondary | ICD-10-CM

## 2012-11-07 DIAGNOSIS — IMO0002 Reserved for concepts with insufficient information to code with codable children: Secondary | ICD-10-CM

## 2012-11-07 DIAGNOSIS — Z933 Colostomy status: Secondary | ICD-10-CM

## 2012-11-07 DIAGNOSIS — L89109 Pressure ulcer of unspecified part of back, unspecified stage: Secondary | ICD-10-CM

## 2012-11-07 DIAGNOSIS — A4159 Other Gram-negative sepsis: Principal | ICD-10-CM | POA: Diagnosis present

## 2012-11-07 DIAGNOSIS — E46 Unspecified protein-calorie malnutrition: Secondary | ICD-10-CM | POA: Diagnosis present

## 2012-11-07 DIAGNOSIS — Z79899 Other long term (current) drug therapy: Secondary | ICD-10-CM

## 2012-11-07 DIAGNOSIS — R8271 Bacteriuria: Secondary | ICD-10-CM

## 2012-11-07 DIAGNOSIS — A499 Bacterial infection, unspecified: Secondary | ICD-10-CM

## 2012-11-07 DIAGNOSIS — D509 Iron deficiency anemia, unspecified: Secondary | ICD-10-CM

## 2012-11-07 DIAGNOSIS — J45909 Unspecified asthma, uncomplicated: Secondary | ICD-10-CM | POA: Diagnosis present

## 2012-11-07 DIAGNOSIS — N390 Urinary tract infection, site not specified: Secondary | ICD-10-CM

## 2012-11-07 DIAGNOSIS — Z8701 Personal history of pneumonia (recurrent): Secondary | ICD-10-CM

## 2012-11-07 DIAGNOSIS — Z8619 Personal history of other infectious and parasitic diseases: Secondary | ICD-10-CM

## 2012-11-07 DIAGNOSIS — Z9989 Dependence on other enabling machines and devices: Secondary | ICD-10-CM | POA: Diagnosis present

## 2012-11-07 DIAGNOSIS — G4733 Obstructive sleep apnea (adult) (pediatric): Secondary | ICD-10-CM

## 2012-11-07 DIAGNOSIS — L089 Local infection of the skin and subcutaneous tissue, unspecified: Secondary | ICD-10-CM | POA: Diagnosis present

## 2012-11-07 DIAGNOSIS — L8994 Pressure ulcer of unspecified site, stage 4: Secondary | ICD-10-CM

## 2012-11-07 DIAGNOSIS — I10 Essential (primary) hypertension: Secondary | ICD-10-CM

## 2012-11-07 DIAGNOSIS — G8250 Quadriplegia, unspecified: Secondary | ICD-10-CM

## 2012-11-07 DIAGNOSIS — A498 Other bacterial infections of unspecified site: Secondary | ICD-10-CM

## 2012-11-07 DIAGNOSIS — A419 Sepsis, unspecified organism: Secondary | ICD-10-CM

## 2012-11-07 DIAGNOSIS — M869 Osteomyelitis, unspecified: Secondary | ICD-10-CM

## 2012-11-07 DIAGNOSIS — I959 Hypotension, unspecified: Secondary | ICD-10-CM | POA: Diagnosis present

## 2012-11-07 DIAGNOSIS — D649 Anemia, unspecified: Secondary | ICD-10-CM

## 2012-11-07 DIAGNOSIS — Z6836 Body mass index (BMI) 36.0-36.9, adult: Secondary | ICD-10-CM

## 2012-11-07 DIAGNOSIS — E871 Hypo-osmolality and hyponatremia: Secondary | ICD-10-CM

## 2012-11-07 DIAGNOSIS — L89309 Pressure ulcer of unspecified buttock, unspecified stage: Secondary | ICD-10-CM | POA: Diagnosis present

## 2012-11-07 DIAGNOSIS — G40909 Epilepsy, unspecified, not intractable, without status epilepticus: Secondary | ICD-10-CM | POA: Diagnosis present

## 2012-11-07 DIAGNOSIS — M86659 Other chronic osteomyelitis, unspecified thigh: Secondary | ICD-10-CM

## 2012-11-07 LAB — BASIC METABOLIC PANEL
BUN: 15 mg/dL (ref 6–23)
CO2: 26 mEq/L (ref 19–32)
Chloride: 104 mEq/L (ref 96–112)
Glucose, Bld: 103 mg/dL — ABNORMAL HIGH (ref 70–99)
Potassium: 3.9 mEq/L (ref 3.5–5.1)

## 2012-11-07 LAB — CBC WITH DIFFERENTIAL/PLATELET
Eosinophils Absolute: 0.4 10*3/uL (ref 0.0–0.7)
Hemoglobin: 6.4 g/dL — CL (ref 13.0–17.0)
Lymphocytes Relative: 10 % — ABNORMAL LOW (ref 12–46)
Lymphs Abs: 1.5 10*3/uL (ref 0.7–4.0)
MCH: 22.8 pg — ABNORMAL LOW (ref 26.0–34.0)
Monocytes Relative: 10 % (ref 3–12)
Neutro Abs: 11.7 10*3/uL — ABNORMAL HIGH (ref 1.7–7.7)
Neutrophils Relative %: 77 % (ref 43–77)
RBC: 2.81 MIL/uL — ABNORMAL LOW (ref 4.22–5.81)
WBC: 15.2 10*3/uL — ABNORMAL HIGH (ref 4.0–10.5)

## 2012-11-07 LAB — PREPARE RBC (CROSSMATCH)

## 2012-11-07 MED ORDER — ONDANSETRON HCL 4 MG PO TABS: 4.0000 mg | ORAL_TABLET | Freq: Four times a day (QID) | ORAL | Status: DC | PRN

## 2012-11-07 MED ORDER — PIPERACILLIN-TAZOBACTAM 3.375 G IVPB 30 MIN
3.3750 g | Freq: Once | INTRAVENOUS | Status: AC
  Administered 2012-11-07: 3.375 g via INTRAVENOUS
  Filled 2012-11-07: qty 50

## 2012-11-07 MED ORDER — PIPERACILLIN-TAZOBACTAM 3.375 G IVPB
3.3750 g | Freq: Three times a day (TID) | INTRAVENOUS | Status: DC
  Administered 2012-11-08 (×2): 3.375 g via INTRAVENOUS
  Filled 2012-11-07 (×3): qty 50

## 2012-11-07 MED ORDER — AMLODIPINE BESYLATE 10 MG PO TABS
10.0000 mg | ORAL_TABLET | Freq: Every morning | ORAL | Status: DC
  Filled 2012-11-07: qty 1

## 2012-11-07 MED ORDER — HYDROCODONE-ACETAMINOPHEN 5-325 MG PO TABS: 1.0000 | ORAL_TABLET | ORAL | Status: DC | PRN

## 2012-11-07 MED ORDER — ADULT MULTIVITAMIN W/MINERALS CH
1.0000 | ORAL_TABLET | Freq: Every morning | ORAL | Status: DC
  Administered 2012-11-08 – 2012-11-20 (×11): 1 via ORAL
  Filled 2012-11-07 (×13): qty 1

## 2012-11-07 MED ORDER — SODIUM CHLORIDE 0.9 % IV SOLN
INTRAVENOUS | Status: DC
  Administered 2012-11-07 – 2012-11-11 (×5): via INTRAVENOUS
  Administered 2012-11-12: 75 mL/h via INTRAVENOUS

## 2012-11-07 MED ORDER — ZINC SULFATE 220 (50 ZN) MG PO CAPS
220.0000 mg | ORAL_CAPSULE | Freq: Every morning | ORAL | Status: DC
  Administered 2012-11-08 – 2012-11-20 (×11): 220 mg via ORAL
  Filled 2012-11-07 (×13): qty 1

## 2012-11-07 MED ORDER — VANCOMYCIN HCL IN DEXTROSE 1-5 GM/200ML-% IV SOLN
1000.0000 mg | Freq: Two times a day (BID) | INTRAVENOUS | Status: DC
  Administered 2012-11-08: 1000 mg via INTRAVENOUS
  Filled 2012-11-07 (×2): qty 200

## 2012-11-07 MED ORDER — BACLOFEN 20 MG PO TABS
20.0000 mg | ORAL_TABLET | Freq: Four times a day (QID) | ORAL | Status: DC
  Administered 2012-11-07 – 2012-11-20 (×46): 20 mg via ORAL
  Filled 2012-11-07 (×54): qty 1

## 2012-11-07 MED ORDER — VANCOMYCIN HCL IN DEXTROSE 1-5 GM/200ML-% IV SOLN
1000.0000 mg | Freq: Once | INTRAVENOUS | Status: AC
  Administered 2012-11-07: 1000 mg via INTRAVENOUS
  Filled 2012-11-07: qty 200

## 2012-11-07 MED ORDER — ACETAMINOPHEN 325 MG PO TABS
650.0000 mg | ORAL_TABLET | Freq: Four times a day (QID) | ORAL | Status: DC | PRN
  Administered 2012-11-08: 650 mg via ORAL
  Filled 2012-11-07: qty 2

## 2012-11-07 MED ORDER — METOCLOPRAMIDE HCL 10 MG PO TABS
10.0000 mg | ORAL_TABLET | Freq: Three times a day (TID) | ORAL | Status: DC
  Administered 2012-11-08 – 2012-11-20 (×34): 10 mg via ORAL
  Filled 2012-11-07 (×46): qty 1

## 2012-11-07 MED ORDER — FERROUS SULFATE 325 (65 FE) MG PO TABS
325.0000 mg | ORAL_TABLET | Freq: Two times a day (BID) | ORAL | Status: DC
  Administered 2012-11-08 – 2012-11-20 (×22): 325 mg via ORAL
  Filled 2012-11-07 (×29): qty 1

## 2012-11-07 MED ORDER — ONDANSETRON HCL 4 MG/2ML IJ SOLN: 4.0000 mg | Freq: Four times a day (QID) | INTRAMUSCULAR | Status: DC | PRN

## 2012-11-07 MED ORDER — VITAMIN C 500 MG PO TABS
500.0000 mg | ORAL_TABLET | Freq: Every morning | ORAL | Status: DC
  Administered 2012-11-08 – 2012-11-20 (×11): 500 mg via ORAL
  Filled 2012-11-07 (×13): qty 1

## 2012-11-07 MED ORDER — ALBUTEROL SULFATE (5 MG/ML) 0.5% IN NEBU: 2.5000 mg | INHALATION_SOLUTION | Freq: Four times a day (QID) | RESPIRATORY_TRACT | Status: DC | PRN

## 2012-11-07 MED ORDER — LIDOCAINE HCL 1 % IJ SOLN
INTRAMUSCULAR | Status: AC
  Administered 2012-11-07: 2.1 mL
  Filled 2012-11-07: qty 20

## 2012-11-07 MED ORDER — DOCUSATE SODIUM 100 MG PO CAPS
100.0000 mg | ORAL_CAPSULE | Freq: Two times a day (BID) | ORAL | Status: DC
  Administered 2012-11-07 – 2012-11-20 (×24): 100 mg via ORAL
  Filled 2012-11-07 (×15): qty 1

## 2012-11-07 MED ORDER — FAMOTIDINE 20 MG PO TABS
20.0000 mg | ORAL_TABLET | Freq: Two times a day (BID) | ORAL | Status: DC
  Administered 2012-11-07 – 2012-11-20 (×24): 20 mg via ORAL
  Filled 2012-11-07 (×27): qty 1

## 2012-11-07 MED ORDER — MORPHINE SULFATE 2 MG/ML IJ SOLN: 1.0000 mg | INTRAMUSCULAR | Status: DC | PRN

## 2012-11-07 MED ORDER — CEFTRIAXONE SODIUM 1 G IJ SOLR
1.0000 g | Freq: Once | INTRAMUSCULAR | Status: AC
  Administered 2012-11-07: 1 g via INTRAMUSCULAR
  Filled 2012-11-07: qty 10

## 2012-11-07 MED ORDER — ACETAMINOPHEN 650 MG RE SUPP: 650.0000 mg | Freq: Four times a day (QID) | RECTAL | Status: DC | PRN

## 2012-11-07 MED ORDER — HEPARIN SODIUM (PORCINE) 5000 UNIT/ML IJ SOLN
5000.0000 [IU] | Freq: Three times a day (TID) | INTRAMUSCULAR | Status: DC
  Administered 2012-11-07 – 2012-11-11 (×11): 5000 [IU] via SUBCUTANEOUS
  Filled 2012-11-07 (×15): qty 1

## 2012-11-07 NOTE — Progress Notes (Signed)
[facility] aware of case management consult.  EDCM placed call to [facility] coordinator.  She will continue to follow patient.

## 2012-11-07 NOTE — Progress Notes (Signed)
Critical value Hgb 6.4  notified K. Craige Cotta awaiting return call.

## 2012-11-07 NOTE — ED Notes (Signed)
Per EMS pt in from home reports had blood work drawn on Friday and told his WBC was elevated and Hgb low. Pt reports he has stage 4 wounds to hips bilaterally.
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
## 2012-11-07 NOTE — Consult Note (Signed)
WOC advised ED staff on normal saline gauze dressings to be appropriate for this pt. Ok to use kerlix, moistened with saline, pack wound and then cover with ABD and secure with tape.   Rahel Carlton [place] RN,CWOCN [phone]

## 2012-11-07 NOTE — Procedures (Signed)
Successful placement of left brachial approach 55 cm dual lumen PICC line with tip within the superior aspect of the SVC.  The PICC line is ready for immediate use.
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
## 2012-11-07 NOTE — ED Notes (Signed)
Unable to obtain blood for labs.RN Riki Rusk and Dr Estell Harpin made aware
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
## 2012-11-07 NOTE — Progress Notes (Signed)
MD on call notified of BP 88/70 pulse 94.

## 2012-11-08 ENCOUNTER — Other Ambulatory Visit: Payer: Self-pay | Admitting: Plastic Surgery

## 2012-11-08 DIAGNOSIS — D509 Iron deficiency anemia, unspecified: Secondary | ICD-10-CM

## 2012-11-08 DIAGNOSIS — B965 Pseudomonas (aeruginosa) (mallei) (pseudomallei) as the cause of diseases classified elsewhere: Secondary | ICD-10-CM

## 2012-11-08 DIAGNOSIS — L89154 Pressure ulcer of sacral region, stage 4: Secondary | ICD-10-CM

## 2012-11-08 DIAGNOSIS — N390 Urinary tract infection, site not specified: Secondary | ICD-10-CM

## 2012-11-08 DIAGNOSIS — B999 Unspecified infectious disease: Secondary | ICD-10-CM

## 2012-11-08 LAB — BASIC METABOLIC PANEL
BUN: 13 mg/dL (ref 6–23)
Chloride: 104 mEq/L (ref 96–112)
Creatinine, Ser: 0.42 mg/dL — ABNORMAL LOW (ref 0.50–1.35)
GFR calc Af Amer: >90 mL/min (ref >90)
GFR calc non Af Amer: >90 mL/min (ref >90)

## 2012-11-08 LAB — CBC
HCT: 25.7 % — ABNORMAL LOW (ref 39.0–52.0)
MCHC: 30.7 g/dL (ref 30.0–36.0)
Platelets: 530 10*3/uL — ABNORMAL HIGH (ref 150–400)
RDW: 17 % — ABNORMAL HIGH (ref 11.5–15.5)
WBC: 12 10*3/uL — ABNORMAL HIGH (ref 4.0–10.5)

## 2012-11-08 LAB — URINE MICROSCOPIC-ADD ON

## 2012-11-08 LAB — URINALYSIS, ROUTINE W REFLEX MICROSCOPIC
Bilirubin Urine: NEGATIVE
Glucose, UA: NEGATIVE mg/dL
Protein, ur: 30 mg/dL — AB
Urobilinogen, UA: 0.2 mg/dL (ref 0.0–1.0)

## 2012-11-08 MED ORDER — SODIUM CHLORIDE 0.9 % IJ SOLN
10.0000 mL | INTRAMUSCULAR | Status: DC | PRN
  Administered 2012-11-08 – 2012-11-09 (×2): 20 mL
  Administered 2012-11-09 – 2012-11-19 (×13): 10 mL
  Administered 2012-11-20: 20 mL

## 2012-11-08 MED ORDER — SODIUM CHLORIDE 0.9 % IJ SOLN
10.0000 mL | Freq: Two times a day (BID) | INTRAMUSCULAR | Status: DC
  Administered 2012-11-08 – 2012-11-18 (×7): 10 mL

## 2012-11-08 MED ORDER — CEFAZOLIN SODIUM-DEXTROSE 2-3 GM-% IV SOLR: 2.0000 g | INTRAVENOUS | Status: DC

## 2012-11-08 MED ORDER — JUVEN PO PACK
1.0000 | PACK | Freq: Two times a day (BID) | ORAL | Status: DC
  Administered 2012-11-08 – 2012-11-20 (×21): 1 via ORAL
  Filled 2012-11-08 (×25): qty 1

## 2012-11-08 MED ORDER — ENSURE COMPLETE PO LIQD
237.0000 mL | Freq: Two times a day (BID) | ORAL | Status: DC
  Administered 2012-11-09 – 2012-11-20 (×19): 237 mL via ORAL

## 2012-11-08 NOTE — Progress Notes (Signed)
Consult received for LTAC referral, spoke with liaison for [facility] who informed me that [facility] does not have LTAC benefit. CSW made aware of possible SNF at d/c.  Algernon Huxley RN BSN  [phone]

## 2012-11-08 NOTE — Progress Notes (Signed)
[facility] PROGRESS NOTE  [name] [name] WGN:[id number] DOB: Feb 24, 1966 DOA: 11/07/2012 PCP: [name], MD  Assessment/Plan:  1- Decubitus ulcer stage 4, chronic osteomyelitis right femur:  Patient presents with worsening drainage from wound, diaphoresis, leukocytosis.  Started on IV vancomycin and Zosyn 6-25.  Dr Danger is planning to do I and D on Monday.  Dr Luciana Axe consulted to help with IV antibiotics options.   2-Mycrocitic Hypochromic Anemia: Hb at 6.4 on admission. S/P 2 units of PRBC 6-25. Hb today at 7.9. Continue with iron supplement.   3-Quadriplegic: Continue with support Care.  4-Leukocytosis: probably secondary to deculbitus ulcer worsening infection. Chest x ray no active diseases. UA with too numerous to count WBC. Blood culture pending.  5-UTI:  UA with too numerous to count WBC. Follow urine culture. Zosyn should cover.  6-Hypotension: hold Norvasc. BP increase to 110 range. Check lactic acid. Continue with IV fluids.  7-Malnutrition: Change diet to regular. Start ensure.   Code Status: Full Code.  Family Communication: care discussed with patient.  Disposition Plan: remain inpatient.    Consultants:  Dr Kelly Splinter  Dr Luciana Axe.   Procedures:  none  Antibiotics:  Vancomycin 6-25  Zosyn 6-25  HPI/Subjective: Feeling ok. Would like regular diet.   Objective: Filed Vitals:   11/08/12 0230 11/08/12 0330 11/08/12 0430 11/08/12 0619  BP: 103/67 119/70 136/85 111/75  Pulse: 106 105 96 98  Temp: 99 F (37.2 C) 99.2 F (37.3 C) 99 F (37.2 C) 98.9 F (37.2 C)  TempSrc: Oral Oral Oral Oral  Resp: 18 16 18 18   SpO2:    97%    Intake/Output Summary (Last 24 hours) at 11/08/12 1121 Last data filed at 11/08/12 7846  Gross per 24 hour  Intake 1602.5 ml  Output   1025 ml  Net  577.5 ml   There were no vitals filed for this visit.  Exam:   General:  No distress  Cardiovascular: S 1, S 2 RRR  Respiratory: CTA  Abdomen: bs present,  distended, colostomy in place.   Musculoskeletal: no edema.   Data Reviewed: Basic Metabolic Panel:  Recent Labs Lab 11/07/12 1815 11/08/12 0620  NA 140 138  K 3.9 3.7  CL 104 104  CO2 26 25  GLUCOSE 103* 95  BUN 15 13  CREATININE 0.40* 0.42*  CALCIUM 9.2 8.9   Liver Function Tests: No results found for this basename: AST, ALT, ALKPHOS, BILITOT, PROT, ALBUMIN,  in the last 168 hours No results found for this basename: LIPASE, AMYLASE,  in the last 168 hours No results found for this basename: AMMONIA,  in the last 168 hours CBC:  Recent Labs Lab 11/02/12 1312 11/07/12 1815 11/08/12 0620  WBC 18.7* 15.2* 12.0*  NEUTROABS 14.3* 11.7*  --   HGB 7.3 confirmed* 6.4* 7.9*  HCT 24.5* 21.5* 25.7*  MCV 76.1* 76.5* 78.4  PLT 582* 724* 530*   Cardiac Enzymes: No results found for this basename: CKTOTAL, CKMB, CKMBINDEX, TROPONINI,  in the last 168 hours BNP (last 3 results) No results found for this basename: PROBNP,  in the last 8760 hours CBG: No results found for this basename: GLUCAP,  in the last 168 hours  No results found for this or any previous visit (from the past 240 hour(s)).   Studies: Dg Chest 2 View  11/07/2012   *RADIOLOGY REPORT*  Clinical Data: Hypertension, diabetes, asthma, elevated white count  CHEST - 2 VIEW  Comparison: 09/01/2012  Findings: Limited exam because of obese body  habitus and positioning.  Left PICC line tip proximal SVC.  Stable cardiomegaly without CHF or pneumonia.  No focal collapse consolidation. Limited lateral views because of overlapping soft tissue shadows and arm positions.  Chronic apical scarring bilaterally.  IMPRESSION: Limited exam but no gross interval change or acute process   Original Report Authenticated By: Judie Petit. Miles Costain, M.D.   Ir Fluoro Guide Cv Line Left  11/07/2012   *RADIOLOGY REPORT*  Indication: Poor venous access  ULTRASOUND AND FLUORSCOPIC GUIDED PICC LINE INSERTION  Intravenous Medications: None  Contrast: None   Fluoroscopy Time:  1 minute, 18-seconds.  Complications: None immediate  Technique / Findings:  The procedure, risks, benefits, and alternatives were explained to the patient and informed written consent was obtained.  A timeout was performed prior to the initiation of the procedure.  Initially, the right upper extremity was prepped with chlorhexidine in a sterile fashion, and a sterile drape was applied covering the operative field.  Maximum barrier sterile technique with sterile gowns and gloves were used for the procedure.  A timeout was performed prior to the initiation of the procedure.  Local anesthesia was provided with 1% lidocaine.  Ultrasound scanning failed to delineate a patent cephalic, brachial or basilic vein within the right upper extremity.  Limited attempt was made to cannulate a hypertrophied venous collateral within the right upper arm however this ultimately proved unsuccessful.  As such, the left upper extremity was prepped and draped in the usual sterile fashion.  Ultrasound scanning failed to delineate pain and left-sided basilic or cephalic vein.  The left brachial vein was noted to be atretic, and likely only amendable to access at the level of the antecubital fossa. As such, under direct ultrasound guidance, the leftbrachialvein was accessed with a micropuncture kit after the overlying soft tissues were anesthetized with 1% lidocaine.  An ultrasound image was saved for documentation purposes.  A guidewire was advanced to the level of the superior caval-atrial junction for measurement purposes.  A peel-away sheath was placed and a 55 cm, 5 [place], dual lumen was inserted to level of the superior aspect of the SVC (maximum catheter length was reached at this location).  A post procedure spot fluoroscopic was obtained.  The catheter easily aspirated and flushed and was sutured in place.  A dressing was placed.  The patient tolerated the procedure well without immediate post procedural  complication.  Impression:  1.  Successful ultrasound and fluoroscopic guided placement of a left brachial vein approach, 55 cm, 5 French,dual lumen PICC with tip terminating within the superior aspect of the SVC.  The catheter cannot be advanced further as this is the maximum length of the PICC line. The PICC line is ready for immediate use.  2.  Nonvisualization of a patent right brachial, basilic or cephalic vein.  PLAN:  This patient will likely require jugular venous access in the future.   Original Report Authenticated By: Tacey Ruiz, MD   Ir US Guide Vasc Access Left  11/07/2012   *RADIOLOGY REPORT*  Indication: Poor venous access  ULTRASOUND AND FLUORSCOPIC GUIDED PICC LINE INSERTION  Intravenous Medications: None  Contrast: None  Fluoroscopy Time:  1 minute, 18-seconds.  Complications: None immediate  Technique / Findings:  The procedure, risks, benefits, and alternatives were explained to the patient and informed written consent was obtained.  A timeout was performed prior to the initiation of the procedure.  Initially, the right upper extremity was prepped with chlorhexidine in a sterile fashion, and a  sterile drape was applied covering the operative field.  Maximum barrier sterile technique with sterile gowns and gloves were used for the procedure.  A timeout was performed prior to the initiation of the procedure.  Local anesthesia was provided with 1% lidocaine.  Ultrasound scanning failed to delineate a patent cephalic, brachial or basilic vein within the right upper extremity.  Limited attempt was made to cannulate a hypertrophied venous collateral within the right upper arm however this ultimately proved unsuccessful.  As such, the left upper extremity was prepped and draped in the usual sterile fashion.  Ultrasound scanning failed to delineate pain and left-sided basilic or cephalic vein.  The left brachial vein was noted to be atretic, and likely only amendable to access at the level of the  antecubital fossa. As such, under direct ultrasound guidance, the leftbrachialvein was accessed with a micropuncture kit after the overlying soft tissues were anesthetized with 1% lidocaine.  An ultrasound image was saved for documentation purposes.  A guidewire was advanced to the level of the superior caval-atrial junction for measurement purposes.  A peel-away sheath was placed and a 55 cm, 5 [place], dual lumen was inserted to level of the superior aspect of the SVC (maximum catheter length was reached at this location).  A post procedure spot fluoroscopic was obtained.  The catheter easily aspirated and flushed and was sutured in place.  A dressing was placed.  The patient tolerated the procedure well without immediate post procedural complication.  Impression:  1.  Successful ultrasound and fluoroscopic guided placement of a left brachial vein approach, 55 cm, 5 French,dual lumen PICC with tip terminating within the superior aspect of the SVC.  The catheter cannot be advanced further as this is the maximum length of the PICC line. The PICC line is ready for immediate use.  2.  Nonvisualization of a patent right brachial, basilic or cephalic vein.  PLAN:  This patient will likely require jugular venous access in the future.   Original Report Authenticated By: Tacey Ruiz, MD    Scheduled Meds: . amLODipine  10 mg Oral q morning - 10a  . baclofen  20 mg Oral QID  .  ceFAZolin (ANCEF) IV  2 g Intravenous On Call to OR  . docusate sodium  100 mg Oral BID  . famotidine  20 mg Oral BID  . ferrous sulfate  325 mg Oral BID WC  . heparin  5,000 Units Subcutaneous Q8H  . metoCLOPramide  10 mg Oral TID AC  . multivitamin with minerals  1 tablet Oral q morning - 10a  . piperacillin-tazobactam (ZOSYN)  IV  3.375 g Intravenous Q8H  . vancomycin  1,000 mg Intravenous Q12H  . vitamin C  500 mg Oral q morning - 10a  . zinc sulfate  220 mg Oral q morning - 10a   Continuous Infusions: . sodium chloride 75  mL/hr at 11/07/12 1906    Active Problems:   Quadriplegia   Leukocytosis   Seizure disorder   HTN (hypertension)   Microcytic anemia   OSA on CPAP    Time spent: 35 minutes    REGALADO,BELKYS  [facility] Pager [phone]. If 7PM-7AM, please contact night-coverage at [email or web address], password [facility] 11/08/2012, 11:21 AM  LOS: 1 day
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
## 2012-11-08 NOTE — Progress Notes (Signed)
Mr Boomer has been active with [facility] primarily telephonically. He is also followed by [facility]. Went to bedside to speak with patient. However, he was sleeping soundly. Will come back at another time. [name], MSN-Ed, RN,BSN- [facility] [phone]

## 2012-11-09 DIAGNOSIS — M869 Osteomyelitis, unspecified: Secondary | ICD-10-CM

## 2012-11-09 LAB — CBC
HCT: 25 % — ABNORMAL LOW (ref 39.0–52.0)
Hemoglobin: 7.5 g/dL — ABNORMAL LOW (ref 13.0–17.0)
MCH: 23.9 pg — ABNORMAL LOW (ref 26.0–34.0)
MCHC: 30 g/dL (ref 30.0–36.0)
MCV: 79.6 fL (ref 78.0–100.0)

## 2012-11-09 LAB — TYPE AND SCREEN
ABO/RH(D): B POS
Antibody Screen: NEGATIVE
Unit division: 0

## 2012-11-09 LAB — BASIC METABOLIC PANEL
BUN: 13 mg/dL (ref 6–23)
Calcium: 8.7 mg/dL (ref 8.4–10.5)
Creatinine, Ser: 0.46 mg/dL — ABNORMAL LOW (ref 0.50–1.35)
GFR calc non Af Amer: >90 mL/min (ref >90)
Glucose, Bld: 102 mg/dL — ABNORMAL HIGH (ref 70–99)
Potassium: 3.3 mEq/L — ABNORMAL LOW (ref 3.5–5.1)

## 2012-11-09 MED ORDER — POTASSIUM CHLORIDE CRYS ER 20 MEQ PO TBCR
40.0000 meq | EXTENDED_RELEASE_TABLET | Freq: Once | ORAL | Status: AC
  Administered 2012-11-09: 40 meq via ORAL
  Filled 2012-11-09: qty 2

## 2012-11-09 NOTE — Progress Notes (Signed)
PT Cancellation Note  Patient Details Name: [name] [name] MRN: [id number] DOB: Jan 31, 1966   Cancelled Treatment:    Reason Eval/Treat Not Completed: PT screened, no needs identified, will sign off.  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  He is at his baseline level of mobility.  Since he does have a pressure wound I did reinforce the need for frequent (every 30-45 min when up in WC) pressure relief and when in the bed every 2 hours turn.  No acute and no f/u PT needs at this time UNLESS after surgery he needs to be followed for hydrotherapy for his wound.  Please re consult at that time if appropriate.   Thanks,   Rollene Rotunda. Layza Summa, PT, DPT [phone]   11/09/2012, 1:59 PM

## 2012-11-09 NOTE — Progress Notes (Signed)
[facility]  Date of Admission:  11/07/2012  Antibiotics: None Vancomycin 2 days Piperacillin-tazobactam 2 days  Subjective: No complaints, no chills  Objective: Temp:  [98.6 F (37 C)-99.7 F (37.6 C)] 98.6 F (37 C) (06/27 0600) Pulse Rate:  [99-109] 103 (06/27 0600) Resp:  [18] 18 (06/27 0600) BP: (101-142)/(58-76) 113/58 mmHg (06/27 0600) SpO2:  [93 %-99 %] 97 % (06/27 0600) Weight:  [229 lb 8 oz (104.1 kg)] 229 lb 8 oz (104.1 kg) (06/27 0600)  General: Awake, alert Skin: no rashes Lungs: CTA B Cor: RRr without mrg Abdomen: soft, nt, nd Ext: upper ext with contractures, + braces  Lab Results Lab Results  Component Value Date   WBC 11.1* 11/09/2012   HGB 7.5* 11/09/2012   HCT 25.0* 11/09/2012   MCV 79.6 11/09/2012   PLT 541* 11/09/2012    Lab Results  Component Value Date   CREATININE 0.46* 11/09/2012   BUN 13 11/09/2012   NA 136 11/09/2012   K 3.3* 11/09/2012   CL 103 11/09/2012   CO2 25 11/09/2012    Lab Results  Component Value Date   ALT 30 09/05/2012   AST 27 09/05/2012   ALKPHOS 102 09/05/2012   BILITOT 0.1* 09/05/2012      Microbiology: Recent Results (from the past 240 hour(s))  CULTURE, BLOOD (ROUTINE X 2)     Status: None   Collection Time    11/07/12  4:00 PM      Result Value Range Status   Specimen Description BLOOD LEFT HAND   Final   Special Requests BOTTLES DRAWN AEROBIC ONLY 2CC   Final   Culture  Setup Time 11/08/2012 01:12   Final   Culture     Final   Value:        BLOOD CULTURE RECEIVED NO GROWTH TO DATE CULTURE WILL BE HELD FOR 5 DAYS BEFORE ISSUING A FINAL NEGATIVE REPORT   Report Status PENDING   Incomplete  CULTURE, BLOOD (ROUTINE X 2)     Status: None   Collection Time    11/07/12  6:15 PM      Result Value Range Status   Specimen Description BLOOD LEFT UPPER ARM PICC   Final   Special Requests BOTTLES DRAWN AEROBIC AND ANAEROBIC   Final   Culture  Setup Time 11/08/2012 01:12   Final   Culture      Final   Value:        BLOOD CULTURE RECEIVED NO GROWTH TO DATE CULTURE WILL BE HELD FOR 5 DAYS BEFORE ISSUING A FINAL NEGATIVE REPORT   Report Status PENDING   Incomplete  URINE CULTURE     Status: None   Collection Time    11/08/12  1:55 AM      Result Value Range Status   Specimen Description URINE, SUPRAPUBIC   Final   Special Requests NONE   Final   Culture  Setup Time 11/08/2012 11:12   Final   Colony Count PENDING   Incomplete   Culture Culture reincubated for better growth   Final   Report Status PENDING   Incomplete  MRSA PCR SCREENING     Status: None   Collection Time    11/08/12  6:13 PM      Result Value Range Status   MRSA by PCR NEGATIVE  NEGATIVE Final   Comment:            The GeneXpert MRSA Assay (FDA  approved for NASAL specimens     only), is one component of a     comprehensive MRSA colonization     surveillance program. It is not     intended to diagnose MRSA     infection nor to guide or     monitor treatment for     MRSA infections.    Studies/Results: Dg Chest 2 View  11/07/2012   *RADIOLOGY REPORT*  Clinical Data: Hypertension, diabetes, asthma, elevated white count  CHEST - 2 VIEW  Comparison: 09/01/2012  Findings: Limited exam because of obese body habitus and positioning.  Left PICC line tip proximal SVC.  Stable cardiomegaly without CHF or pneumonia.  No focal collapse consolidation. Limited lateral views because of overlapping soft tissue shadows and arm positions.  Chronic apical scarring bilaterally.  IMPRESSION: Limited exam but no gross interval change or acute process   Original Report Authenticated By: Judie Petit. Miles Costain, M.D.   Ir Fluoro Guide Cv Line Left  11/07/2012   *RADIOLOGY REPORT*  Indication: Poor venous access  ULTRASOUND AND FLUORSCOPIC GUIDED PICC LINE INSERTION  Intravenous Medications: None  Contrast: None  Fluoroscopy Time:  1 minute, 18-seconds.  Complications: None immediate  Technique / Findings:  The procedure, risks, benefits, and  alternatives were explained to the patient and informed written consent was obtained.  A timeout was performed prior to the initiation of the procedure.  Initially, the right upper extremity was prepped with chlorhexidine in a sterile fashion, and a sterile drape was applied covering the operative field.  Maximum barrier sterile technique with sterile gowns and gloves were used for the procedure.  A timeout was performed prior to the initiation of the procedure.  Local anesthesia was provided with 1% lidocaine.  Ultrasound scanning failed to delineate a patent cephalic, brachial or basilic vein within the right upper extremity.  Limited attempt was made to cannulate a hypertrophied venous collateral within the right upper arm however this ultimately proved unsuccessful.  As such, the left upper extremity was prepped and draped in the usual sterile fashion.  Ultrasound scanning failed to delineate pain and left-sided basilic or cephalic vein.  The left brachial vein was noted to be atretic, and likely only amendable to access at the level of the antecubital fossa. As such, under direct ultrasound guidance, the leftbrachialvein was accessed with a micropuncture kit after the overlying soft tissues were anesthetized with 1% lidocaine.  An ultrasound image was saved for documentation purposes.  A guidewire was advanced to the level of the superior caval-atrial junction for measurement purposes.  A peel-away sheath was placed and a 55 cm, 5 [place], dual lumen was inserted to level of the superior aspect of the SVC (maximum catheter length was reached at this location).  A post procedure spot fluoroscopic was obtained.  The catheter easily aspirated and flushed and was sutured in place.  A dressing was placed.  The patient tolerated the procedure well without immediate post procedural complication.  Impression:  1.  Successful ultrasound and fluoroscopic guided placement of a left brachial vein approach, 55 cm, 5  French,dual lumen PICC with tip terminating within the superior aspect of the SVC.  The catheter cannot be advanced further as this is the maximum length of the PICC line. The PICC line is ready for immediate use.  2.  Nonvisualization of a patent right brachial, basilic or cephalic vein.  PLAN:  This patient will likely require jugular venous access in the future.   Original Report Authenticated By:  Tacey Ruiz, MD   Ir US Guide Vasc Access Left  11/07/2012   *RADIOLOGY REPORT*  Indication: Poor venous access  ULTRASOUND AND FLUORSCOPIC GUIDED PICC LINE INSERTION  Intravenous Medications: None  Contrast: None  Fluoroscopy Time:  1 minute, 18-seconds.  Complications: None immediate  Technique / Findings:  The procedure, risks, benefits, and alternatives were explained to the patient and informed written consent was obtained.  A timeout was performed prior to the initiation of the procedure.  Initially, the right upper extremity was prepped with chlorhexidine in a sterile fashion, and a sterile drape was applied covering the operative field.  Maximum barrier sterile technique with sterile gowns and gloves were used for the procedure.  A timeout was performed prior to the initiation of the procedure.  Local anesthesia was provided with 1% lidocaine.  Ultrasound scanning failed to delineate a patent cephalic, brachial or basilic vein within the right upper extremity.  Limited attempt was made to cannulate a hypertrophied venous collateral within the right upper arm however this ultimately proved unsuccessful.  As such, the left upper extremity was prepped and draped in the usual sterile fashion.  Ultrasound scanning failed to delineate pain and left-sided basilic or cephalic vein.  The left brachial vein was noted to be atretic, and likely only amendable to access at the level of the antecubital fossa. As such, under direct ultrasound guidance, the leftbrachialvein was accessed with a micropuncture kit after the  overlying soft tissues were anesthetized with 1% lidocaine.  An ultrasound image was saved for documentation purposes.  A guidewire was advanced to the level of the superior caval-atrial junction for measurement purposes.  A peel-away sheath was placed and a 55 cm, 5 [place], dual lumen was inserted to level of the superior aspect of the SVC (maximum catheter length was reached at this location).  A post procedure spot fluoroscopic was obtained.  The catheter easily aspirated and flushed and was sutured in place.  A dressing was placed.  The patient tolerated the procedure well without immediate post procedural complication.  Impression:  1.  Successful ultrasound and fluoroscopic guided placement of a left brachial vein approach, 55 cm, 5 French,dual lumen PICC with tip terminating within the superior aspect of the SVC.  The catheter cannot be advanced further as this is the maximum length of the PICC line. The PICC line is ready for immediate use.  2.  Nonvisualization of a patent right brachial, basilic or cephalic vein.  PLAN:  This patient will likely require jugular venous access in the future.   Original Report Authenticated By: Tacey Ruiz, MD    Assessment/Plan: 1) osteomyelitis - Some improvement with previous antibiotics but now back with purulence.  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  Will continue to hold antibiotics.  Had bone biopsy previously that showed the Pseudomonas.  I suspect this will be the same with a new cultures.  Bone biopsy to be done again with surgery Monday and this will help with sensitivities and we can send for colistin, other carbapenems (meropenem, doripenem) to see if by chance other antibiotics show good sensitivities.    I will be available over the weekend if needed, otherwise Dr. Daiva Eves will follow up on Monday  Staci Righter, MD [facility] [email or web address] [phone] pager   [phone] cell 11/09/2012, 1:31 PM

## 2012-11-09 NOTE — Progress Notes (Signed)
Agree with the above and we are working on getting the patient to the OR.  Need Hgb improvement.

## 2012-11-09 NOTE — Progress Notes (Signed)
[facility] PROGRESS NOTE  [name] [name] WUJ:[id number] DOB: [birth year] DOA: 11/07/2012 PCP: [name], MD  Assessment/Plan:  1- Decubitus ulcer stage 4, chronic osteomyelitis right femur:  Patient presents with worsening drainage from wound, diaphoresis, leukocytosis.  Started on IV vancomycin and Zosyn 6-25. Stop antibiotics pending biopsy.  Dr Danger is planning to do I and D on Monday.  Dr Luciana Axe consulted to help with IV antibiotics options.   2-Mycrocitic Hypochromic Anemia: Hb at 6.4 on admission. S/P 2 units of PRBC 6-25. Hb today at 7.9. Continue with iron supplement. Repeat hb in am, if continue to decrease might need blood transfusion prior to sx.   3-Quadriplegic: Continue with support Care.  4-Leukocytosis: probably secondary to deculbitus ulcer worsening infection. Chest x ray no active diseases. UA with too numerous to count WBC. Blood culture no growth to date.  5-UTI:  UA with too numerous to count WBC. Follow urine culture.  6-Hypotension: hold Norvasc. BP increase to 110 range. Check lactic acid. Continue with IV fluids.  7-Malnutrition: Change diet to regular. Start ensure.   Code Status: Full Code.  Family Communication: care discussed with patient.  Disposition Plan: remain inpatient.    Consultants:  Dr Kelly Splinter  Dr Luciana Axe.   Procedures:  none  Antibiotics:  Vancomycin 6-25  Zosyn 6-25  HPI/Subjective: Feeling ok. No complaints.  Objective: Filed Vitals:   11/08/12 1447 11/08/12 2200 11/09/12 0600 11/09/12 1411  BP: 101/72 142/76 113/58 122/60  Pulse: 99 109 103 102  Temp: 98.6 F (37 C) 99.7 F (37.6 C) 98.6 F (37 C) 97.9 F (36.6 C)  TempSrc: Oral Oral Oral Oral  Resp: 18 18 18 18   Weight:   104.1 kg (229 lb 8 oz)   SpO2: 99% 93% 97% 95%    Intake/Output Summary (Last 24 hours) at 11/09/12 1419 Last data filed at 11/09/12 1400  Gross per 24 hour  Intake   2845 ml  Output    600 ml  Net   2245 ml   Filed Weights   11/09/12 0600  Weight: 104.1 kg (229 lb 8 oz)    Exam:   General:  No distress  Cardiovascular: S 1, S 2 RRR  Respiratory: CTA  Abdomen: bs present, distended, colostomy in place.   Musculoskeletal: no edema.   Data Reviewed: Basic Metabolic Panel:  Recent Labs Lab 11/07/12 1815 11/08/12 0620 11/09/12 0335  NA 140 138 136  K 3.9 3.7 3.3*  CL 104 104 103  CO2 26 25 25   GLUCOSE 103* 95 102*  BUN 15 13 13   CREATININE 0.40* 0.42* 0.46*  CALCIUM 9.2 8.9 8.7   Liver Function Tests: No results found for this basename: AST, ALT, ALKPHOS, BILITOT, PROT, ALBUMIN,  in the last 168 hours No results found for this basename: LIPASE, AMYLASE,  in the last 168 hours No results found for this basename: AMMONIA,  in the last 168 hours CBC:  Recent Labs Lab 11/07/12 1815 11/08/12 0620 11/09/12 0335  WBC 15.2* 12.0* 11.1*  NEUTROABS 11.7*  --   --   HGB 6.4* 7.9* 7.5*  HCT 21.5* 25.7* 25.0*  MCV 76.5* 78.4 79.6  PLT 724* 530* 541*   Cardiac Enzymes: No results found for this basename: CKTOTAL, CKMB, CKMBINDEX, TROPONINI,  in the last 168 hours BNP (last 3 results) No results found for this basename: PROBNP,  in the last 8760 hours CBG: No results found for this basename: GLUCAP,  in the last 168 hours  Recent Results (from  the past 240 hour(s))  CULTURE, BLOOD (ROUTINE X 2)     Status: None   Collection Time    11/07/12  4:00 PM      Result Value Range Status   Specimen Description BLOOD LEFT HAND   Final   Special Requests BOTTLES DRAWN AEROBIC ONLY 2CC   Final   Culture  Setup Time 11/08/2012 01:12   Final   Culture     Final   Value:        BLOOD CULTURE RECEIVED NO GROWTH TO DATE CULTURE WILL BE HELD FOR 5 DAYS BEFORE ISSUING A FINAL NEGATIVE REPORT   Report Status PENDING   Incomplete  CULTURE, BLOOD (ROUTINE X 2)     Status: None   Collection Time    11/07/12  6:15 PM      Result Value Range Status   Specimen Description BLOOD LEFT UPPER ARM PICC   Final    Special Requests BOTTLES DRAWN AEROBIC AND ANAEROBIC   Final   Culture  Setup Time 11/08/2012 01:12   Final   Culture     Final   Value:        BLOOD CULTURE RECEIVED NO GROWTH TO DATE CULTURE WILL BE HELD FOR 5 DAYS BEFORE ISSUING A FINAL NEGATIVE REPORT   Report Status PENDING   Incomplete  URINE CULTURE     Status: None   Collection Time    11/08/12  1:55 AM      Result Value Range Status   Specimen Description URINE, SUPRAPUBIC   Final   Special Requests NONE   Final   Culture  Setup Time 11/08/2012 11:12   Final   Colony Count PENDING   Incomplete   Culture Culture reincubated for better growth   Final   Report Status PENDING   Incomplete  MRSA PCR SCREENING     Status: None   Collection Time    11/08/12  6:13 PM      Result Value Range Status   MRSA by PCR NEGATIVE  NEGATIVE Final   Comment:            The GeneXpert MRSA Assay (FDA     approved for NASAL specimens     only), is one component of a     comprehensive MRSA colonization     surveillance program. It is not     intended to diagnose MRSA     infection nor to guide or     monitor treatment for     MRSA infections.     Studies: Dg Chest 2 View  11/07/2012   *RADIOLOGY REPORT*  Clinical Data: Hypertension, diabetes, asthma, elevated white count  CHEST - 2 VIEW  Comparison: 09/01/2012  Findings: Limited exam because of obese body habitus and positioning.  Left PICC line tip proximal SVC.  Stable cardiomegaly without CHF or pneumonia.  No focal collapse consolidation. Limited lateral views because of overlapping soft tissue shadows and arm positions.  Chronic apical scarring bilaterally.  IMPRESSION: Limited exam but no gross interval change or acute process   Original Report Authenticated By: Judie Petit. Miles Costain, M.D.   Ir Fluoro Guide Cv Line Left  11/07/2012   *RADIOLOGY REPORT*  Indication: Poor venous access  ULTRASOUND AND FLUORSCOPIC GUIDED PICC LINE INSERTION  Intravenous Medications: None  Contrast: None  Fluoroscopy  Time:  1 minute, 18-seconds.  Complications: None immediate  Technique / Findings:  The procedure, risks, benefits, and alternatives were explained to the patient and informed  written consent was obtained.  A timeout was performed prior to the initiation of the procedure.  Initially, the right upper extremity was prepped with chlorhexidine in a sterile fashion, and a sterile drape was applied covering the operative field.  Maximum barrier sterile technique with sterile gowns and gloves were used for the procedure.  A timeout was performed prior to the initiation of the procedure.  Local anesthesia was provided with 1% lidocaine.  Ultrasound scanning failed to delineate a patent cephalic, brachial or basilic vein within the right upper extremity.  Limited attempt was made to cannulate a hypertrophied venous collateral within the right upper arm however this ultimately proved unsuccessful.  As such, the left upper extremity was prepped and draped in the usual sterile fashion.  Ultrasound scanning failed to delineate pain and left-sided basilic or cephalic vein.  The left brachial vein was noted to be atretic, and likely only amendable to access at the level of the antecubital fossa. As such, under direct ultrasound guidance, the leftbrachialvein was accessed with a micropuncture kit after the overlying soft tissues were anesthetized with 1% lidocaine.  An ultrasound image was saved for documentation purposes.  A guidewire was advanced to the level of the superior caval-atrial junction for measurement purposes.  A peel-away sheath was placed and a 55 cm, 5 [place], dual lumen was inserted to level of the superior aspect of the SVC (maximum catheter length was reached at this location).  A post procedure spot fluoroscopic was obtained.  The catheter easily aspirated and flushed and was sutured in place.  A dressing was placed.  The patient tolerated the procedure well without immediate post procedural complication.   Impression:  1.  Successful ultrasound and fluoroscopic guided placement of a left brachial vein approach, 55 cm, 5 French,dual lumen PICC with tip terminating within the superior aspect of the SVC.  The catheter cannot be advanced further as this is the maximum length of the PICC line. The PICC line is ready for immediate use.  2.  Nonvisualization of a patent right brachial, basilic or cephalic vein.  PLAN:  This patient will likely require jugular venous access in the future.   Original Report Authenticated By: Tacey Ruiz, MD   Ir US Guide Vasc Access Left  11/07/2012   *RADIOLOGY REPORT*  Indication: Poor venous access  ULTRASOUND AND FLUORSCOPIC GUIDED PICC LINE INSERTION  Intravenous Medications: None  Contrast: None  Fluoroscopy Time:  1 minute, 18-seconds.  Complications: None immediate  Technique / Findings:  The procedure, risks, benefits, and alternatives were explained to the patient and informed written consent was obtained.  A timeout was performed prior to the initiation of the procedure.  Initially, the right upper extremity was prepped with chlorhexidine in a sterile fashion, and a sterile drape was applied covering the operative field.  Maximum barrier sterile technique with sterile gowns and gloves were used for the procedure.  A timeout was performed prior to the initiation of the procedure.  Local anesthesia was provided with 1% lidocaine.  Ultrasound scanning failed to delineate a patent cephalic, brachial or basilic vein within the right upper extremity.  Limited attempt was made to cannulate a hypertrophied venous collateral within the right upper arm however this ultimately proved unsuccessful.  As such, the left upper extremity was prepped and draped in the usual sterile fashion.  Ultrasound scanning failed to delineate pain and left-sided basilic or cephalic vein.  The left brachial vein was noted to be atretic, and likely only  amendable to access at the level of the antecubital  fossa. As such, under direct ultrasound guidance, the leftbrachialvein was accessed with a micropuncture kit after the overlying soft tissues were anesthetized with 1% lidocaine.  An ultrasound image was saved for documentation purposes.  A guidewire was advanced to the level of the superior caval-atrial junction for measurement purposes.  A peel-away sheath was placed and a 55 cm, 5 [place], dual lumen was inserted to level of the superior aspect of the SVC (maximum catheter length was reached at this location).  A post procedure spot fluoroscopic was obtained.  The catheter easily aspirated and flushed and was sutured in place.  A dressing was placed.  The patient tolerated the procedure well without immediate post procedural complication.  Impression:  1.  Successful ultrasound and fluoroscopic guided placement of a left brachial vein approach, 55 cm, 5 French,dual lumen PICC with tip terminating within the superior aspect of the SVC.  The catheter cannot be advanced further as this is the maximum length of the PICC line. The PICC line is ready for immediate use.  2.  Nonvisualization of a patent right brachial, basilic or cephalic vein.  PLAN:  This patient will likely require jugular venous access in the future.   Original Report Authenticated By: Tacey Ruiz, MD    Scheduled Meds: . baclofen  20 mg Oral QID  .  ceFAZolin (ANCEF) IV  2 g Intravenous 30 min Pre-Op  . docusate sodium  100 mg Oral BID  . famotidine  20 mg Oral BID  . feeding supplement  237 mL Oral BID BM  . ferrous sulfate  325 mg Oral BID WC  . heparin  5,000 Units Subcutaneous Q8H  . metoCLOPramide  10 mg Oral TID AC  . multivitamin with minerals  1 tablet Oral q morning - 10a  . nutrition supplement  1 packet Oral BID BM  . potassium chloride  40 mEq Oral Once  . sodium chloride  10-40 mL Intracatheter Q12H  . vitamin C  500 mg Oral q morning - 10a  . zinc sulfate  220 mg Oral q morning - 10a   Continuous Infusions: .  sodium chloride 75 mL/hr at 11/08/12 1211    Active Problems:   Quadriplegia   Leukocytosis   Seizure disorder   HTN (hypertension)   Microcytic anemia   OSA on CPAP    Time spent: 35 minutes    [name] [name]  [facility] Pager [phone]. If 7PM-7AM, please contact night-coverage at [email or web address], password [facility] 11/09/2012, 2:19 PM  LOS: 2 days

## 2012-11-09 NOTE — Evaluation (Signed)
Occupational Therapy Evaluation Patient Details Name: Parsa Rickett MRN: [id number] DOB: 02/04/1966 Today's Date: 11/09/2012 Time:  -     OT Assessment / Plan / Recommendation History of present illness Caedmon Louque is a 47 y.o. male admiotted due to increased drainage, odor from decubitus ulcer. Patient also with diaphoresis, chills.    Clinical Impression   Pt is a  47 y/o maile with quadriplegia, hx seizures, UTIs, and stage IV sacral decubitus ulcer presents with recurrent decubitus ulcer wound infection and weakness. Pt lives at home with 24/7 caregiver and requires total A for selfcare/ADLs and mobility (uses lift at home). Pt at baseline and requires no further acute OT services at this time      OT Assessment  Patient does not need any further OT services    Follow Up Recommendations  No OT follow up    Barriers to Discharge  None, pt has 24/7 caregiver    Equipment Recommendations   none   Recommendations for Other Services  none  Frequency       Precautions / Restrictions Precautions Precautions: Other (comment) (Quadriplegic) Precaution Comments: decreased sensation B UEs. impaired tone in B forearms/hands/wrists, pt has R UE tenodesis splint Restrictions Weight Bearing Restrictions: Yes RLE Weight Bearing: Non weight bearing LLE Weight Bearing: Non weight bearing       ADL  Grooming: Wash/dry hands;Wash/dry face;Teeth care;Moderate assistance Where Assessed - Grooming: Supine, head of bed up Upper Body Bathing: +1 Total assistance Lower Body Bathing: +1 Total assistance Upper Body Dressing: +1 Total assistance Lower Body Dressing: +1 Total assistance Toilet Transfer: +1 Total assistance Transfers/Ambulation Related to ADLs: pt requires lift for all mobility previously at home ADL Comments: Pt receives total A for selfcare from 24/7 aide at home. Pt able to feed self using A/E and perform grooming tasks with mod A (same level as A at home)    OT Diagnosis:     OT Problem List:   OT Treatment Interventions:     OT Goals(Current goals can be found in the care plan section)    Visit Information  Last OT Received On: 11/09/12 History of Present Illness: Toris Laverdiere is a 47 y.o. male admitted due to increased drainage, odor from decubitus ulcer. Patient also with diaphoresis, chills.        Prior Functioning     Home Living Family/patient expects to be discharged to:: Private residence Living Arrangements: Non-relatives/Friends Available Help at Discharge: Personal care attendant Type of Home: House Home Access: Ramped entrance Home Layout: One level Home Equipment: Wheelchair - power;Hospital bed Additional Comments: has hoyer lift, L forearm splint, universal cuff with T bar Prior Function Level of Independence: Needs assistance;Independent with assistive device(s) Gait / Transfers Assistance Needed: hoyer lift ADL's / Homemaking Assistance Needed: total A, caregiver lives with pt 24/7, uses univeral cuff for self feeding Communication Communication: No difficulties Dominant Hand: Right (can use left)         Vision/Perception Vision - History Baseline Vision: Wears glasses all the time Patient Visual Report: No change from baseline Perception Perception: Within Functional Limits   Cognition  Cognition Arousal/Alertness: Awake/alert Behavior During Therapy: WFL for tasks assessed/performed Overall Cognitive Status: Within Functional Limits for tasks assessed    Extremity/Trunk Assessment Upper Extremity Assessment Upper Extremity Assessment: RUE deficits/detail;LUE deficits/detail RUE Deficits / Details: hx of quadriplegia, UE ROM deficits. No change per pt. Pt has tenodesis splint for R forearm. Pt states that caregiver provided B UE ROM 1 - 2x/day and  donns/doffs splints daily RUE Sensation: decreased light touch;decreased proprioception RUE Coordination: decreased fine motor;decreased gross motor LUE Sensation:  decreased light touch;decreased proprioception LUE Coordination: decreased gross motor;decreased fine motor     Mobility Bed Mobility Bed Mobility: Not assessed Details for Bed Mobility Assistance: total A, using hoyer at home Transfers Transfers: Not assessed Details for Transfer Assistance: requires lift for all mobility     Exercise General Exercises - Upper Extremity Shoulder Flexion: AAROM;Right;Left;10 reps Shoulder ABduction: PROM;Right;Left;10 reps Shoulder ADduction: PROM;Right;Left;10 reps Elbow Flexion: AAROM;Right;Left;10 reps Elbow Extension: AAROM;Right;Left;10 reps Wrist Flexion: PROM;Right;Left;10 reps Wrist Extension: PROM;Right;Left;10 reps Digit Composite Flexion: PROM;Right;Left Composite Extension: PROM;Right;Left;10 reps   Balance Balance Balance Assessed: No   End of Session OT - End of Session Activity Tolerance: Patient tolerated treatment well Patient left: in bed  GO     Galen Manila 11/09/2012, 1:28 PM

## 2012-11-09 NOTE — Plan of Care (Signed)
Problem: Phase II Progression Outcomes Goal: Discharge plan established No follow up indicated at this time after acute care d/c

## 2012-11-09 NOTE — Progress Notes (Signed)
placed patient on CPAP.Full Face mask per home regiment Setting is 19 cmH2O per home settings. Patient is tolerating well.

## 2012-11-10 DIAGNOSIS — E871 Hypo-osmolality and hyponatremia: Secondary | ICD-10-CM

## 2012-11-10 LAB — BASIC METABOLIC PANEL
CO2: 27 mEq/L (ref 19–32)
Calcium: 9 mg/dL (ref 8.4–10.5)
Creatinine, Ser: 0.35 mg/dL — ABNORMAL LOW (ref 0.50–1.35)

## 2012-11-10 LAB — CBC
MCH: 25 pg — ABNORMAL LOW (ref 26.0–34.0)
MCV: 80.4 fL (ref 78.0–100.0)
Platelets: 529 10*3/uL — ABNORMAL HIGH (ref 150–400)
RBC: 3.12 MIL/uL — ABNORMAL LOW (ref 4.22–5.81)

## 2012-11-10 MED ORDER — DEXTROSE 5 % IV SOLN
1.0000 g | INTRAVENOUS | Status: DC
  Administered 2012-11-10 – 2012-11-12 (×3): 1 g via INTRAVENOUS
  Filled 2012-11-10 (×4): qty 10

## 2012-11-10 NOTE — Progress Notes (Signed)
[facility] PROGRESS NOTE  [name] [name] ZOX:[id number] DOB: 1965-11-07 DOA: 11/07/2012 PCP: [name], MD  Assessment/Plan:  1- Decubitus ulcer stage 4, chronic osteomyelitis right femur:  Patient presents with worsening drainage from wound, diaphoresis, leukocytosis.  Started on IV vancomycin and Zosyn 6-25.  Antibiotics stopped  pending biopsy.  Dr Danger is planning to do I and D on Monday.  Dr Luciana Axe consulted to help with IV antibiotics options.   2-Mycrocitic Hypochromic Anemia: Hb at 6.4 on admission. S/P 2 units of PRBC 6-25. Hb today at 7.8. Continue with iron supplement. Repeat hb in am, if continue to decrease might need blood transfusion prior to sx.   3-Quadriplegic: Continue with support Care.  4-Leukocytosis: probably secondary to decubitus ulcer worsening infection. Chest x ray no active diseases. UA with too numerous to count WBC. Blood culture no growth to date.  5-UTI:  UA with too numerous to count WBC.  urine culture Gram Negative rods.  6-Hypotension: hold Norvasc. BP increase to 110 range. lactic acid 1.4. Continue with IV fluids. Will add ceftriaxone to cover for UTI.   7-Malnutrition: ensure, multivitamins.   Code Status: Full Code.  Family Communication: care discussed with patient.  Disposition Plan: remain inpatient.    Consultants:  Dr Kelly Splinter  Dr Luciana Axe.   Procedures:  none  Antibiotics:  Vancomycin 6-25// 6-26  Zosyn 6-25 // 6-26  Ceftriaxone 6-28  HPI/Subjective: Feeling ok. No complaints.  Objective: Filed Vitals:   11/09/12 0600 11/09/12 1411 11/09/12 2200 11/10/12 0600  BP: 113/58 122/60 115/78 106/70  Pulse: 103 102 102 90  Temp: 98.6 F (37 C) 97.9 F (36.6 C) 98.7 F (37.1 C) 98 F (36.7 C)  TempSrc: Oral Oral Oral Oral  Resp: 18 18 20 20   Weight: 104.1 kg (229 lb 8 oz)   104 kg (229 lb 4.5 oz)  SpO2: 97% 95% 98% 99%    Intake/Output Summary (Last 24 hours) at 11/10/12 1216 Last data filed at 11/10/12 1112  Gross per 24 hour  Intake   1410 ml  Output   2101 ml  Net   -691 ml   Filed Weights   11/09/12 0600 11/10/12 0600  Weight: 104.1 kg (229 lb 8 oz) 104 kg (229 lb 4.5 oz)    Exam:   General:  No distress  Cardiovascular: S 1, S 2 RRR  Respiratory: CTA  Abdomen: bs present, distended, colostomy in place.   Musculoskeletal: no edema.   Data Reviewed: Basic Metabolic Panel:  Recent Labs Lab 11/07/12 1815 11/08/12 0620 11/09/12 0335 11/10/12 1140  NA 140 138 136 144  K 3.9 3.7 3.3* 3.6  CL 104 104 103 109  CO2 26 25 25 27   GLUCOSE 103* 95 102* 136*  BUN 15 13 13 10   CREATININE 0.40* 0.42* 0.46* 0.35*  CALCIUM 9.2 8.9 8.7 9.0   Liver Function Tests: No results found for this basename: AST, ALT, ALKPHOS, BILITOT, PROT, ALBUMIN,  in the last 168 hours No results found for this basename: LIPASE, AMYLASE,  in the last 168 hours No results found for this basename: AMMONIA,  in the last 168 hours CBC:  Recent Labs Lab 11/07/12 1815 11/08/12 0620 11/09/12 0335 11/10/12 1140  WBC 15.2* 12.0* 11.1* 11.7*  NEUTROABS 11.7*  --   --   --   HGB 6.4* 7.9* 7.5* 7.8*  HCT 21.5* 25.7* 25.0* 25.1*  MCV 76.5* 78.4 79.6 80.4  PLT 724* 530* 541* 529*   Cardiac Enzymes: No results found for  this basename: CKTOTAL, CKMB, CKMBINDEX, TROPONINI,  in the last 168 hours BNP (last 3 results) No results found for this basename: PROBNP,  in the last 8760 hours CBG: No results found for this basename: GLUCAP,  in the last 168 hours  Recent Results (from the past 240 hour(s))  CULTURE, BLOOD (ROUTINE X 2)     Status: None   Collection Time    11/07/12  4:00 PM      Result Value Range Status   Specimen Description BLOOD LEFT HAND   Final   Special Requests BOTTLES DRAWN AEROBIC ONLY 2CC   Final   Culture  Setup Time 11/08/2012 01:12   Final   Culture     Final   Value:        BLOOD CULTURE RECEIVED NO GROWTH TO DATE CULTURE WILL BE HELD FOR 5 DAYS BEFORE ISSUING A FINAL NEGATIVE  REPORT   Report Status PENDING   Incomplete  CULTURE, BLOOD (ROUTINE X 2)     Status: None   Collection Time    11/07/12  6:15 PM      Result Value Range Status   Specimen Description BLOOD LEFT UPPER ARM PICC   Final   Special Requests BOTTLES DRAWN AEROBIC AND ANAEROBIC   Final   Culture  Setup Time 11/08/2012 01:12   Final   Culture     Final   Value:        BLOOD CULTURE RECEIVED NO GROWTH TO DATE CULTURE WILL BE HELD FOR 5 DAYS BEFORE ISSUING A FINAL NEGATIVE REPORT   Report Status PENDING   Incomplete  URINE CULTURE     Status: None   Collection Time    11/08/12  1:55 AM      Result Value Range Status   Specimen Description URINE, SUPRAPUBIC   Final   Special Requests NONE   Final   Culture  Setup Time 11/08/2012 11:12   Final   Colony Count >=100,000 COLONIES/ML   Final   Culture GRAM NEGATIVE RODS   Final   Report Status PENDING   Incomplete  MRSA PCR SCREENING     Status: None   Collection Time    11/08/12  6:13 PM      Result Value Range Status   MRSA by PCR NEGATIVE  NEGATIVE Final   Comment:            The GeneXpert MRSA Assay (FDA     approved for NASAL specimens     only), is one component of a     comprehensive MRSA colonization     surveillance program. It is not     intended to diagnose MRSA     infection nor to guide or     monitor treatment for     MRSA infections.     Studies: No results found.  Scheduled Meds: . baclofen  20 mg Oral QID  .  ceFAZolin (ANCEF) IV  2 g Intravenous 30 min Pre-Op  . docusate sodium  100 mg Oral BID  . famotidine  20 mg Oral BID  . feeding supplement  237 mL Oral BID BM  . ferrous sulfate  325 mg Oral BID WC  . heparin  5,000 Units Subcutaneous Q8H  . metoCLOPramide  10 mg Oral TID AC  . multivitamin with minerals  1 tablet Oral q morning - 10a  . nutrition supplement  1 packet Oral BID BM  . sodium chloride  10-40 mL Intracatheter Q12H  .  vitamin C  500 mg Oral q morning - 10a  . zinc sulfate  220 mg Oral q  morning - 10a   Continuous Infusions: . sodium chloride 75 mL/hr at 11/10/12 1052    Active Problems:   Quadriplegia   Leukocytosis   Seizure disorder   HTN (hypertension)   Microcytic anemia   OSA on CPAP    Time spent: 35 minutes    Micha Dosanjh  [facility] Pager [phone]. If 7PM-7AM, please contact night-coverage at [email or web address], password [facility] 11/10/2012, 12:16 PM  LOS: 3 days

## 2012-11-11 LAB — CBC
MCH: 23.7 pg — ABNORMAL LOW (ref 26.0–34.0)
MCV: 80.5 fL (ref 78.0–100.0)
Platelets: 513 10*3/uL — ABNORMAL HIGH (ref 150–400)
RDW: 17.8 % — ABNORMAL HIGH (ref 11.5–15.5)
WBC: 9.7 10*3/uL (ref 4.0–10.5)

## 2012-11-11 LAB — BASIC METABOLIC PANEL
Calcium: 9 mg/dL (ref 8.4–10.5)
Creatinine, Ser: 0.38 mg/dL — ABNORMAL LOW (ref 0.50–1.35)
GFR calc Af Amer: >90 mL/min (ref >90)
GFR calc non Af Amer: >90 mL/min (ref >90)

## 2012-11-11 LAB — MAGNESIUM: Magnesium: 2 mg/dL (ref 1.5–2.5)

## 2012-11-11 MED ORDER — ALTEPLASE 2 MG IJ SOLR
2.0000 mg | Freq: Once | INTRAMUSCULAR | Status: AC
  Administered 2012-11-11: 2 mg
  Filled 2012-11-11: qty 2

## 2012-11-11 MED ORDER — ALTEPLASE 100 MG IV SOLR: 2.0000 mg | Freq: Once | INTRAVENOUS | Status: DC

## 2012-11-11 MED ORDER — AMLODIPINE BESYLATE 5 MG PO TABS
5.0000 mg | ORAL_TABLET | Freq: Every day | ORAL | Status: DC
  Administered 2012-11-11 – 2012-11-20 (×9): 5 mg via ORAL
  Filled 2012-11-11 (×10): qty 1

## 2012-11-11 NOTE — Significant Event (Signed)
Patient asked that dressing change be done when he wakes up.Marland KitchenMarland Kitchen

## 2012-11-11 NOTE — Progress Notes (Signed)
[facility] PROGRESS NOTE  [name] [name] RUE:[id number] DOB: Mar 17, 1966 DOA: 11/07/2012 PCP: [name], MD  Assessment/Plan:  1- Decubitus ulcer stage 4, chronic osteomyelitis right femur:  Patient presents on 6-25 with worsening drainage from wound, diaphoresis, leukocytosis.  Started on IV vancomycin and Zosyn 6-25.  Antibiotics stopped  pending biopsy.  Dr Danger is planning to do I and D 6-30. ID following.  2-Mycrocitic Hypochromic Anemia: Hb at 6.4 on admission. S/P 2 units of PRBC 6-25. Hb today at 7.3. Continue with iron supplement. Will transfuse one unit in anticipation for sx tomorrow.   3-Quadriplegic: Continue with support Care.  4-Leukocytosis: probably secondary to decubitus ulcer worsening infection, UTI. Chest x ray no active diseases. UA with too numerous to count WBC. Blood culture no growth to date. Normalized.  5-UTI:  UA with too numerous to count WBC.  urine culture Gram Negative rods. Started Ceftriaxone 6-28. Day 2.  6-Hypotension: resolved. Resume Norvasc.  lactic acid 1.4. Continue with Ceftriaxone treating UTI.  7-Malnutrition: ensure, multivitamins.  8-Hypertension; Resume Norvasc.   Code Status: Full Code.  Family Communication: care discussed with patient.  Disposition Plan: remain inpatient.    Consultants:  Dr Kelly Splinter  Dr Luciana Axe.   Procedures:  none  Antibiotics:  Vancomycin 6-25// 6-26  Zosyn 6-25 // 6-26  Ceftriaxone 6-28  HPI/Subjective: Feeling ok. No complaints. Waiting for sx.   Objective: Filed Vitals:   11/10/12 1335 11/10/12 2100 11/10/12 2200 11/11/12 0510  BP: 158/102 159/123  149/82  Pulse: 91 86  86  Temp: 97.8 F (36.6 C) 98.2 F (36.8 C)  98.1 F (36.7 C)  TempSrc: Oral Oral  Axillary  Resp: 20 20  20   Height:      Weight:    104.8 kg (231 lb 0.7 oz)  SpO2: 97% 100% 100% 100%    Intake/Output Summary (Last 24 hours) at 11/11/12 1303 Last data filed at 11/11/12 1027  Gross per 24 hour  Intake    1860 ml  Output   2252 ml  Net   -392 ml   Filed Weights   11/09/12 0600 11/10/12 0600 11/11/12 0510  Weight: 104.1 kg (229 lb 8 oz) 104 kg (229 lb 4.5 oz) 104.8 kg (231 lb 0.7 oz)    Exam:   General:  No distress  Cardiovascular: S 1, S 2 RRR  Respiratory: CTA  Abdomen: bs present, distended, colostomy in place.   Musculoskeletal: no edema.   Data Reviewed: Basic Metabolic Panel:  Recent Labs Lab 11/07/12 1815 11/08/12 0620 11/09/12 0335 11/10/12 1140 11/11/12 0425  NA 140 138 136 144 142  K 3.9 3.7 3.3* 3.6 3.8  CL 104 104 103 109 107  CO2 26 25 25 27 28   GLUCOSE 103* 95 102* 136* 102*  BUN 15 13 13 10 12   CREATININE 0.40* 0.42* 0.46* 0.35* 0.38*  CALCIUM 9.2 8.9 8.7 9.0 9.0  MG  --   --   --   --  2.0   Liver Function Tests: No results found for this basename: AST, ALT, ALKPHOS, BILITOT, PROT, ALBUMIN,  in the last 168 hours No results found for this basename: LIPASE, AMYLASE,  in the last 168 hours No results found for this basename: AMMONIA,  in the last 168 hours CBC:  Recent Labs Lab 11/07/12 1815 11/08/12 0620 11/09/12 0335 11/10/12 1140 11/11/12 0425  WBC 15.2* 12.0* 11.1* 11.7* 9.7  NEUTROABS 11.7*  --   --   --   --   HGB 6.4*  7.9* 7.5* 7.8* 7.3*  HCT 21.5* 25.7* 25.0* 25.1* 24.8*  MCV 76.5* 78.4 79.6 80.4 80.5  PLT 724* 530* 541* 529* 513*   Cardiac Enzymes: No results found for this basename: CKTOTAL, CKMB, CKMBINDEX, TROPONINI,  in the last 168 hours BNP (last 3 results) No results found for this basename: PROBNP,  in the last 8760 hours CBG: No results found for this basename: GLUCAP,  in the last 168 hours  Recent Results (from the past 240 hour(s))  CULTURE, BLOOD (ROUTINE X 2)     Status: None   Collection Time    11/07/12  4:00 PM      Result Value Range Status   Specimen Description BLOOD LEFT HAND   Final   Special Requests BOTTLES DRAWN AEROBIC ONLY 2CC   Final   Culture  Setup Time 11/08/2012 01:12   Final   Culture      Final   Value:        BLOOD CULTURE RECEIVED NO GROWTH TO DATE CULTURE WILL BE HELD FOR 5 DAYS BEFORE ISSUING A FINAL NEGATIVE REPORT   Report Status PENDING   Incomplete  CULTURE, BLOOD (ROUTINE X 2)     Status: None   Collection Time    11/07/12  6:15 PM      Result Value Range Status   Specimen Description BLOOD LEFT UPPER ARM PICC   Final   Special Requests BOTTLES DRAWN AEROBIC AND ANAEROBIC   Final   Culture  Setup Time 11/08/2012 01:12   Final   Culture     Final   Value:        BLOOD CULTURE RECEIVED NO GROWTH TO DATE CULTURE WILL BE HELD FOR 5 DAYS BEFORE ISSUING A FINAL NEGATIVE REPORT   Report Status PENDING   Incomplete  URINE CULTURE     Status: None   Collection Time    11/08/12  1:55 AM      Result Value Range Status   Specimen Description URINE, SUPRAPUBIC   Final   Special Requests NONE   Final   Culture  Setup Time 11/08/2012 11:12   Final   Colony Count >=100,000 COLONIES/ML   Final   Culture GRAM NEGATIVE RODS   Final   Report Status PENDING   Incomplete  MRSA PCR SCREENING     Status: None   Collection Time    11/08/12  6:13 PM      Result Value Range Status   MRSA by PCR NEGATIVE  NEGATIVE Final   Comment:            The GeneXpert MRSA Assay (FDA     approved for NASAL specimens     only), is one component of a     comprehensive MRSA colonization     surveillance program. It is not     intended to diagnose MRSA     infection nor to guide or     monitor treatment for     MRSA infections.     Studies: No results found.  Scheduled Meds: . baclofen  20 mg Oral QID  .  ceFAZolin (ANCEF) IV  2 g Intravenous 30 min Pre-Op  . cefTRIAXone (ROCEPHIN)  IV  1 g Intravenous Q24H  . docusate sodium  100 mg Oral BID  . famotidine  20 mg Oral BID  . feeding supplement  237 mL Oral BID BM  . ferrous sulfate  325 mg Oral BID WC  . heparin  5,000 Units  Subcutaneous Q8H  . metoCLOPramide  10 mg Oral TID AC  . multivitamin with minerals  1 tablet Oral q  morning - 10a  . nutrition supplement  1 packet Oral BID BM  . sodium chloride  10-40 mL Intracatheter Q12H  . vitamin C  500 mg Oral q morning - 10a  . zinc sulfate  220 mg Oral q morning - 10a   Continuous Infusions: . sodium chloride 75 mL/hr at 11/10/12 1052    Active Problems:   Quadriplegia   Leukocytosis   Seizure disorder   HTN (hypertension)   Microcytic anemia   OSA on CPAP    Time spent: 35 minutes    Antoinette Borgwardt  [facility] Pager [phone]. If 7PM-7AM, please contact night-coverage at [email or web address], password [facility] 11/11/2012, 1:03 PM  LOS: 4 days

## 2012-11-12 ENCOUNTER — Encounter: Payer: Self-pay | Admitting: Infectious Diseases

## 2012-11-12 ENCOUNTER — Other Ambulatory Visit: Payer: Self-pay | Admitting: Plastic Surgery

## 2012-11-12 ENCOUNTER — Encounter (HOSPITAL_COMMUNITY)
Admission: EM | Disposition: A | Discharge status: Skilled Nursing Facility | Payer: Self-pay | Source: Home / Self Care | Attending: Internal Medicine

## 2012-11-12 ENCOUNTER — Inpatient Hospital Stay (HOSPITAL_COMMUNITY): Payer: Medicare Other | Admitting: Anesthesiology

## 2012-11-12 ENCOUNTER — Encounter (HOSPITAL_COMMUNITY): Payer: Self-pay | Admitting: Plastic Surgery

## 2012-11-12 ENCOUNTER — Encounter (HOSPITAL_COMMUNITY): Payer: Self-pay | Admitting: Anesthesiology

## 2012-11-12 DIAGNOSIS — L89154 Pressure ulcer of sacral region, stage 4: Secondary | ICD-10-CM

## 2012-11-12 HISTORY — PX: INCISION AND DRAINAGE OF WOUND: SHX1803

## 2012-11-12 LAB — CBC
Platelets: 530 10*3/uL — ABNORMAL HIGH (ref 150–400)
RDW: 17.5 % — ABNORMAL HIGH (ref 11.5–15.5)
WBC: 13.2 10*3/uL — ABNORMAL HIGH (ref 4.0–10.5)

## 2012-11-12 LAB — BASIC METABOLIC PANEL
Chloride: 101 mEq/L (ref 96–112)
Creatinine, Ser: 0.36 mg/dL — ABNORMAL LOW (ref 0.50–1.35)
GFR calc Af Amer: >90 mL/min (ref >90)
Potassium: 3.9 mEq/L (ref 3.5–5.1)

## 2012-11-12 LAB — TYPE AND SCREEN: Unit division: 0

## 2012-11-12 SURGERY — IRRIGATION AND DEBRIDEMENT WOUND
Anesthesia: General | Site: Coccyx | Wound class: Dirty or Infected

## 2012-11-12 MED ORDER — SODIUM CHLORIDE 0.9 % IR SOLN
Status: DC | PRN
  Administered 2012-11-12: 1000 mL

## 2012-11-12 MED ORDER — FENTANYL CITRATE 0.05 MG/ML IJ SOLN
INTRAMUSCULAR | Status: DC | PRN
  Administered 2012-11-12: 50 ug via INTRAVENOUS
  Administered 2012-11-12 (×2): 100 ug via INTRAVENOUS

## 2012-11-12 MED ORDER — SODIUM CHLORIDE 0.9 % IR SOLN
Status: DC | PRN
  Administered 2012-11-12: 13:00:00

## 2012-11-12 MED ORDER — LIDOCAINE HCL (PF) 2 % IJ SOLN
INTRAMUSCULAR | Status: DC | PRN
  Administered 2012-11-12: 75 mg

## 2012-11-12 MED ORDER — NEOSTIGMINE METHYLSULFATE 1 MG/ML IJ SOLN
INTRAMUSCULAR | Status: DC | PRN
  Administered 2012-11-12: 4 mg via INTRAVENOUS

## 2012-11-12 MED ORDER — ROCURONIUM BROMIDE 100 MG/10ML IV SOLN
INTRAVENOUS | Status: DC | PRN
  Administered 2012-11-12: 50 mg via INTRAVENOUS

## 2012-11-12 MED ORDER — PROPOFOL 10 MG/ML IV BOLUS
INTRAVENOUS | Status: DC | PRN
  Administered 2012-11-12: 200 mg via INTRAVENOUS

## 2012-11-12 MED ORDER — CHLORHEXIDINE GLUCONATE CLOTH 2 % EX PADS
6.0000 | MEDICATED_PAD | Freq: Every day | CUTANEOUS | Status: DC
  Administered 2012-11-12: 6 via TOPICAL

## 2012-11-12 MED ORDER — CEFAZOLIN SODIUM-DEXTROSE 2-3 GM-% IV SOLR
2.0000 g | INTRAVENOUS | Status: DC
  Filled 2012-11-12: qty 50

## 2012-11-12 MED ORDER — LACTATED RINGERS IV SOLN: INTRAVENOUS | Status: DC

## 2012-11-12 MED ORDER — LACTATED RINGERS IV SOLN
INTRAVENOUS | Status: DC
  Administered 2012-11-12: 1000 mL via INTRAVENOUS

## 2012-11-12 MED ORDER — ONDANSETRON HCL 4 MG/2ML IJ SOLN
INTRAMUSCULAR | Status: DC | PRN
  Administered 2012-11-12: 4 mg via INTRAVENOUS

## 2012-11-12 MED ORDER — 0.9 % SODIUM CHLORIDE (POUR BTL) OPTIME
TOPICAL | Status: DC | PRN
  Administered 2012-11-12: 1000 mL

## 2012-11-12 MED ORDER — GLYCOPYRROLATE 0.2 MG/ML IJ SOLN
INTRAMUSCULAR | Status: DC | PRN
  Administered 2012-11-12: 0.6 mg via INTRAVENOUS

## 2012-11-12 MED ORDER — MIDAZOLAM HCL 5 MG/5ML IJ SOLN
INTRAMUSCULAR | Status: DC | PRN
  Administered 2012-11-12: 2 mg via INTRAVENOUS

## 2012-11-12 MED ORDER — FENTANYL CITRATE 0.05 MG/ML IJ SOLN: 25.0000 ug | INTRAMUSCULAR | Status: DC | PRN

## 2012-11-12 SURGICAL SUPPLY — 34 items
BLADE HEX COATED 2.75 (ELECTRODE) ×2 IMPLANT
BURN MATRIX MATRISTEM 7X10 (Orthopedic Implant) ×2 IMPLANT
CANISTER SUCTION 2500CC (MISCELLANEOUS) ×2 IMPLANT
CLOTH BEACON ORANGE TIMEOUT ST (SAFETY) ×2 IMPLANT
COVER SURGICAL LIGHT HANDLE (MISCELLANEOUS) ×2 IMPLANT
DECANTER SPIKE VIAL GLASS SM (MISCELLANEOUS) IMPLANT
DRAPE LAPAROTOMY T 102X78X121 (DRAPES) ×2 IMPLANT
DRAPE UTILITY XL STRL (DRAPES) ×2 IMPLANT
DRSG ADAPTIC 3X8 NADH LF (GAUZE/BANDAGES/DRESSINGS) ×1 IMPLANT
DRSG EMULSION OIL 3X16 NADH (GAUZE/BANDAGES/DRESSINGS) ×1 IMPLANT
DRSG PAD ABDOMINAL 8X10 ST (GAUZE/BANDAGES/DRESSINGS) ×3 IMPLANT
DRSG VAC ATS LRG SENSATRAC (GAUZE/BANDAGES/DRESSINGS) ×1 IMPLANT
ELECT REM PT RETURN 9FT ADLT (ELECTROSURGICAL) ×2
ELECTRODE REM PT RTRN 9FT ADLT (ELECTROSURGICAL) ×1 IMPLANT
GOWN STRL NON-REIN LRG LVL3 (GOWN DISPOSABLE) ×4 IMPLANT
HANDPIECE INTERPULSE COAX TIP (DISPOSABLE) ×2
KIT BASIN OR (CUSTOM PROCEDURE TRAY) ×2 IMPLANT
MATRIX SURGICAL PSM 10X15CM (Tissue) ×1 IMPLANT
MICROMATRIX 1000MG (Tissue) ×2 IMPLANT
MICROMATRIX 500MG (Tissue) ×8 IMPLANT
MatriStem Wound Matrix (Miscellaneous) ×1 IMPLANT
NEEDLE HYPO 22GX1.5 SAFETY (NEEDLE) IMPLANT
NS IRRIG 1000ML POUR BTL (IV SOLUTION) ×2 IMPLANT
PACK GENERAL/GYN (CUSTOM PROCEDURE TRAY) ×2 IMPLANT
PAD ABD 7.5X8 STRL (GAUZE/BANDAGES/DRESSINGS) ×1 IMPLANT
SET HNDPC FAN SPRY TIP SCT (DISPOSABLE) ×1 IMPLANT
SOLUTION PARTIC MCRMTRX 1000MG (Tissue) IMPLANT
SOLUTION PARTIC MCRMTRX 500MG (Tissue) IMPLANT
SPONGE GAUZE 4X4 12PLY (GAUZE/BANDAGES/DRESSINGS) ×2 IMPLANT
STAPLER VISISTAT (STAPLE) ×1 IMPLANT
STAPLER VISISTAT 35W (STAPLE) ×1 IMPLANT
SUT VIC AB 5-0 PS2 18 (SUTURE) ×5 IMPLANT
SYR CONTROL 10ML LL (SYRINGE) IMPLANT
TOWEL OR 17X26 10 PK STRL BLUE (TOWEL DISPOSABLE) ×2 IMPLANT

## 2012-11-12 NOTE — Anesthesia Postprocedure Evaluation (Signed)
  Anesthesia Post-op Note  Patient: [name] [name]  Procedure(s) Performed: Procedure(s) (LRB): IRRIGATION AND DEBRIDEMENT OF SACRAL ULCER WITH PLACEMENT OF A CELL AND VAC  (N/A)  Patient Location: PACU  Anesthesia Type: General  Level of Consciousness: awake and alert   Airway and Oxygen Therapy: Patient Spontanous Breathing  Post-op Pain: mild  Post-op Assessment: Post-op Vital signs reviewed, Patient's Cardiovascular Status Stable, Respiratory Function Stable, Patent Airway and No signs of Nausea or vomiting  Last Vitals:  Filed Vitals:   11/12/12 1430  BP:   Pulse:   Temp: 36.5 C  Resp:     Post-op Vital Signs: stable   Complications: No apparent anesthesia complications

## 2012-11-12 NOTE — Interval H&P Note (Signed)
History and Physical Interval Note:  11/12/2012 11:20 AM  [name] [name]  has presented today for surgery, with the diagnosis of SACRAL ULCER WOUND  The various methods of treatment have been discussed with the patient and family. After consideration of risks, benefits and other options for treatment, the patient has consented to  Procedure(s): IRRIGATION AND DEBRIDEMENT OF SACRAL ULCER WITH PLACEMENT OF A CELL AND VAC  (N/A) as a surgical intervention .  The patient's history has been reviewed, patient examined, no change in status, stable for surgery.  I have reviewed the patient's chart and labs.  Questions were answered to the patient's satisfaction.     SANGER,Halim Surrette

## 2012-11-12 NOTE — Progress Notes (Signed)
Patient notified that OR staff on the way to transport him to surgery

## 2012-11-12 NOTE — Progress Notes (Signed)
RT placed Pt on CPAP via FFM set at 19 CMH20.  Pt tolerating well at this time, RT to monitor and assess as needed.

## 2012-11-12 NOTE — Brief Op Note (Signed)
11/07/2012 - 11/12/2012  2:00 PM  PATIENT:  [name] [name]  47 y.o. male  PRE-OPERATIVE DIAGNOSIS:  SACRAL ULCER WOUND  POST-OPERATIVE DIAGNOSIS:  sacral ulcer wound   PROCEDURE:  Procedure(s) with comments: IRRIGATION AND DEBRIDEMENT OF SACRAL ULCER WITH PLACEMENT OF A CELL AND VAC  (N/A) - sacrum  SURGEON:  Surgeon(s) and Role:    * Claire Sanger, DO - Primary  PHYSICIAN ASSISTANT: none  ASSISTANTS: none   ANESTHESIA:   general  EBL:  Total I/O In: -  Out: 400 [Urine:400]  BLOOD ADMINISTERED:none  DRAINS: none   LOCAL MEDICATIONS USED:  NONE  SPECIMEN:  No Specimen  DISPOSITION OF SPECIMEN:  N/A  COUNTS:  YES  TOURNIQUET:  * No tourniquets in log *  DICTATION: .Dragon Dictation  PLAN OF CARE: back to unit  PATIENT DISPOSITION:  PACU - hemodynamically stable.   Delay start of Pharmacological VTE agent (>24hrs) due to surgical blood loss or risk of bleeding: no

## 2012-11-12 NOTE — Transfer of Care (Signed)
Immediate Anesthesia Transfer of Care Note  Patient: [name] [name]  Procedure(s) Performed: Procedure(s) with comments: IRRIGATION AND DEBRIDEMENT OF SACRAL ULCER WITH PLACEMENT OF A CELL AND VAC  (N/A) - sacrum  Patient Location: PACU  Anesthesia Type:General  Level of Consciousness: awake, alert , oriented and patient cooperative  Airway & Oxygen Therapy: Patient Spontanous Breathing and Patient connected to face mask oxygen  Post-op Assessment: Report given to PACU RN and Post -op Vital signs reviewed and stable  Post vital signs: Reviewed and stable  Complications: No apparent anesthesia complications

## 2012-11-12 NOTE — Progress Notes (Signed)
[facility] PROGRESS NOTE  [name] [name] WUJ:[id number] DOB: Oct 25, 1965 DOA: 11/07/2012 PCP: [name], MD  Assessment/Plan:  1- Decubitus ulcer stage 4, chronic osteomyelitis right femur:  Patient presents on 6-25 with worsening drainage from wound, diaphoresis, leukocytosis.  Started on IV vancomycin and Zosyn 6-25.  Antibiotics stopped  pending biopsy.   I and D  Today 6-30. ID following.  2-Mycrocitic Hypochromic Anemia: Hb at 6.4 on admission. S/P 2 units of PRBC 6-25. Hb today at 7.3. Continue with iron supplement. S/P 1 PRBC 6-29 preparation for surgery. Hb at 9.   3-Quadriplegic: Continue with support Care.  4-Leukocytosis: probably secondary to decubitus ulcer worsening infection, UTI. Chest x ray no active diseases. UA with too numerous to count WBC. Blood culture no growth to date.  5-UTI:  UA with too numerous to count WBC.  urine culture Gram Negative rods. Started Ceftriaxone 6-28. Day 3.  6-Hypotension: resolved. Resume Norvasc.  lactic acid 1.4. Continue with Ceftriaxone treating UTI.  7-Malnutrition: ensure, multivitamins.  8-Hypertension; Resume Norvasc.   Code Status: Full Code.  Family Communication: care discussed with patient.  Disposition Plan: remain inpatient.    Consultants:  Dr Kelly Splinter  Dr Luciana Axe.   Procedures:  none  Antibiotics:  Vancomycin 6-25// 6-26  Zosyn 6-25 // 6-26  Ceftriaxone 6-28  HPI/Subjective: Feeling ok. No complaints. Waiting for sx.   Objective: Filed Vitals:   11/11/12 1800 11/11/12 1854 11/11/12 2127 11/12/12 0553  BP: 160/93 159/93 148/86 127/82  Pulse: 94 72 80 85  Temp: 98.3 F (36.8 C) 98.7 F (37.1 C) 98.1 F (36.7 C) 98 F (36.7 C)  TempSrc:  Oral Oral Axillary  Resp: 18 18 18 18   Height:      Weight:      SpO2: 99% 100% 100% 95%    Intake/Output Summary (Last 24 hours) at 11/12/12 1138 Last data filed at 11/12/12 1137  Gross per 24 hour  Intake 2442.5 ml  Output   2752 ml  Net -309.5 ml    Filed Weights   11/09/12 0600 11/10/12 0600 11/11/12 0510  Weight: 104.1 kg (229 lb 8 oz) 104 kg (229 lb 4.5 oz) 104.8 kg (231 lb 0.7 oz)    Exam:   General:  No distress  Cardiovascular: S 1, S 2 RRR  Respiratory: CTA  Abdomen: bs present, distended, colostomy in place.   Musculoskeletal: no edema.   Data Reviewed: Basic Metabolic Panel:  Recent Labs Lab 11/08/12 0620 11/09/12 0335 11/10/12 1140 11/11/12 0425 11/12/12 0525  NA 138 136 144 142 136  K 3.7 3.3* 3.6 3.8 3.9  CL 104 103 109 107 101  CO2 25 25 27 28 28   GLUCOSE 95 102* 136* 102* 97  BUN 13 13 10 12 10   CREATININE 0.42* 0.46* 0.35* 0.38* 0.36*  CALCIUM 8.9 8.7 9.0 9.0 9.2  MG  --   --   --  2.0  --    Liver Function Tests: No results found for this basename: AST, ALT, ALKPHOS, BILITOT, PROT, ALBUMIN,  in the last 168 hours No results found for this basename: LIPASE, AMYLASE,  in the last 168 hours No results found for this basename: AMMONIA,  in the last 168 hours CBC:  Recent Labs Lab 11/07/12 1815 11/08/12 0620 11/09/12 0335 11/10/12 1140 11/11/12 0425 11/12/12 0525  WBC 15.2* 12.0* 11.1* 11.7* 9.7 13.2*  NEUTROABS 11.7*  --   --   --   --   --   HGB 6.4* 7.9* 7.5* 7.8*  7.3* 9.1*  HCT 21.5* 25.7* 25.0* 25.1* 24.8* 29.6*  MCV 76.5* 78.4 79.6 80.4 80.5 80.7  PLT 724* 530* 541* 529* 513* 530*   Cardiac Enzymes: No results found for this basename: CKTOTAL, CKMB, CKMBINDEX, TROPONINI,  in the last 168 hours BNP (last 3 results) No results found for this basename: PROBNP,  in the last 8760 hours CBG: No results found for this basename: GLUCAP,  in the last 168 hours  Recent Results (from the past 240 hour(s))  CULTURE, BLOOD (ROUTINE X 2)     Status: None   Collection Time    11/07/12  4:00 PM      Result Value Range Status   Specimen Description BLOOD LEFT HAND   Final   Special Requests BOTTLES DRAWN AEROBIC ONLY 2CC   Final   Culture  Setup Time 11/08/2012 01:12   Final   Culture      Final   Value:        BLOOD CULTURE RECEIVED NO GROWTH TO DATE CULTURE WILL BE HELD FOR 5 DAYS BEFORE ISSUING A FINAL NEGATIVE REPORT   Report Status PENDING   Incomplete  CULTURE, BLOOD (ROUTINE X 2)     Status: None   Collection Time    11/07/12  6:15 PM      Result Value Range Status   Specimen Description BLOOD LEFT UPPER ARM PICC   Final   Special Requests BOTTLES DRAWN AEROBIC AND ANAEROBIC   Final   Culture  Setup Time 11/08/2012 01:12   Final   Culture     Final   Value:        BLOOD CULTURE RECEIVED NO GROWTH TO DATE CULTURE WILL BE HELD FOR 5 DAYS BEFORE ISSUING A FINAL NEGATIVE REPORT   Report Status PENDING   Incomplete  URINE CULTURE     Status: None   Collection Time    11/08/12  1:55 AM      Result Value Range Status   Specimen Description URINE, SUPRAPUBIC   Final   Special Requests NONE   Final   Culture  Setup Time 11/08/2012 11:12   Final   Colony Count >=100,000 COLONIES/ML   Final   Culture GRAM NEGATIVE RODS   Final   Report Status PENDING   Incomplete  MRSA PCR SCREENING     Status: None   Collection Time    11/08/12  6:13 PM      Result Value Range Status   MRSA by PCR NEGATIVE  NEGATIVE Final   Comment:            The GeneXpert MRSA Assay (FDA     approved for NASAL specimens     only), is one component of a     comprehensive MRSA colonization     surveillance program. It is not     intended to diagnose MRSA     infection nor to guide or     monitor treatment for     MRSA infections.     Studies: No results found.  Scheduled Meds: . amLODipine  5 mg Oral Daily  . baclofen  20 mg Oral QID  .  ceFAZolin (ANCEF) IV  2 g Intravenous 30 min Pre-Op  . cefTRIAXone (ROCEPHIN)  IV  1 g Intravenous Q24H  . Chlorhexidine Gluconate Cloth  6 each Topical Q0600  . docusate sodium  100 mg Oral BID  . famotidine  20 mg Oral BID  . feeding supplement  237 mL  Oral BID BM  . ferrous sulfate  325 mg Oral BID WC  . metoCLOPramide  10 mg Oral TID AC  .  multivitamin with minerals  1 tablet Oral q morning - 10a  . nutrition supplement  1 packet Oral BID BM  . sodium chloride  10-40 mL Intracatheter Q12H  . vitamin C  500 mg Oral q morning - 10a  . zinc sulfate  220 mg Oral q morning - 10a   Continuous Infusions: . sodium chloride 75 mL/hr at 11/11/12 1349    Active Problems:   Quadriplegia   Leukocytosis   Seizure disorder   HTN (hypertension)   Microcytic anemia   OSA on CPAP    Time spent: 35 minutes    REGALADO,BELKYS  [facility] Pager [phone]. If 7PM-7AM, please contact night-coverage at [email or web address], password [facility] 11/12/2012, 11:38 AM  LOS: 5 days
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
## 2012-11-12 NOTE — Anesthesia Preprocedure Evaluation (Addendum)
Anesthesia Evaluation  Patient identified by MRN, date of birth, ID band Patient awake    Reviewed: Allergy & Precautions, H&P , NPO status , Patient's Chart, lab work & pertinent test results  Airway Mallampati: III TM Distance: >3 FB Neck ROM: full    Dental no notable dental hx. (+) Teeth Intact and Dental Advisory Given   Pulmonary sleep apnea and Continuous Positive Airway Pressure Ventilation ,  breath sounds clear to auscultation  Pulmonary exam normal       Cardiovascular Exercise Tolerance: Poor hypertension, Pt. on medications Rhythm:regular Rate:Normal     Neuro/Psych Seizures -,  quadraplegia C5 fracture negative psych ROS   GI/Hepatic negative GI ROS, Neg liver ROS,   Endo/Other  negative endocrine ROSMorbid obesity  Renal/GU negative Renal ROS  negative genitourinary   Musculoskeletal   Abdominal (+) + obese,   Peds  Hematology negative hematology ROS (+) Blood dyscrasia, anemia ,   Anesthesia Other Findings   Reproductive/Obstetrics negative OB ROS                          Anesthesia Physical Anesthesia Plan  ASA: III  Anesthesia Plan: General   Post-op Pain Management:    Induction: Intravenous  Airway Management Planned: Oral ETT  Additional Equipment:   Intra-op Plan:   Post-operative Plan: Extubation in OR  Informed Consent: I have reviewed the patients History and Physical, chart, labs and discussed the procedure including the risks, benefits and alternatives for the proposed anesthesia with the patient or authorized representative who has indicated his/her understanding and acceptance.   Dental Advisory Given  Plan Discussed with: CRNA and Surgeon  Anesthesia Plan Comments:         Anesthesia Quick Evaluation
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
## 2012-11-12 NOTE — Clinical Social Work Psychosocial (Unsigned)
      Clinical Social Work Department BRIEF PSYCHOSOCIAL ASSESSMENT 11/12/2012  Patient:  [name] [name],[name] [name]     Account Number:  [id number]     Admit date:  11/07/2012  Clinical Social Worker:  Hattie Perch  Date/Time:  11/12/2012 12:00 M  Referred by:  Physician  Date Referred:  11/12/2012 Referred for  SNF Placement   Other Referral:   Interview type:  Patient Other interview type:    PSYCHOSOCIAL DATA Living Status:  ALONE Admitted from facility:   Level of care:   Primary support name:  Bethann Berkshire Primary support relationship to patient:  FRIEND Degree of support available:   fair    CURRENT CONCERNS Current Concerns  Post-Acute Placement   Other Concerns:    SOCIAL WORK ASSESSMENT / PLAN CSW met with patient. patient is alert and oriented X3. patient has a large decub and needs wound care at a snf upon discharge. Patient has been to snf before but is not happy about going back. CSW explained MD concerns, particularly since patient will have a new wound vac. patient is agreeable to being faxed out and recieving bed offers.   Assessment/plan status:   Other assessment/ plan:   Information/referral to community resources:    PATIENTS/FAMILYS RESPONSE TO PLAN OF CARE: patient is reluctantly agreeable to being faxed out and recieving bed offers. He is not happy about need for snf due to extensive wound care.

## 2012-11-12 NOTE — Progress Notes (Signed)
Discussed preoperative preparation with patient regarding CHG bath, consent, what to expect when going to the operating room.

## 2012-11-13 ENCOUNTER — Encounter (HOSPITAL_COMMUNITY): Payer: Self-pay | Admitting: Plastic Surgery

## 2012-11-13 DIAGNOSIS — R82998 Other abnormal findings in urine: Secondary | ICD-10-CM

## 2012-11-13 DIAGNOSIS — M86659 Other chronic osteomyelitis, unspecified thigh: Secondary | ICD-10-CM

## 2012-11-13 LAB — URINE CULTURE: Colony Count: >=100000

## 2012-11-13 LAB — CBC
HCT: 27.8 % — ABNORMAL LOW (ref 39.0–52.0)
Hemoglobin: 8.5 g/dL — ABNORMAL LOW (ref 13.0–17.0)
MCHC: 30.6 g/dL (ref 30.0–36.0)
WBC: 12.9 10*3/uL — ABNORMAL HIGH (ref 4.0–10.5)

## 2012-11-13 LAB — BASIC METABOLIC PANEL
BUN: 10 mg/dL (ref 6–23)
CO2: 29 mEq/L (ref 19–32)
Chloride: 101 mEq/L (ref 96–112)
Glucose, Bld: 104 mg/dL — ABNORMAL HIGH (ref 70–99)
Potassium: 3.8 mEq/L (ref 3.5–5.1)

## 2012-11-13 MED ORDER — DEXTROSE 5 % IV SOLN
2.0000 g | Freq: Two times a day (BID) | INTRAVENOUS | Status: DC
  Administered 2012-11-13: 2 g via INTRAVENOUS
  Filled 2012-11-13 (×2): qty 2

## 2012-11-13 NOTE — Progress Notes (Signed)
[facility]  Day # 1 cefepime  4  days of ceftriaxone 2 days zosyn 2 days vancomycin  Subjective: [name]    Antibiotics:  Anti-infectives   Start     Dose/Rate Route Frequency Ordered Stop   11/13/12 1200  ceFEPIme (MAXIPIME) 2 g in dextrose 5 % 50 mL IVPB  Status:  Discontinued     2 g 100 mL/hr over 30 Minutes Intravenous Every 12 hours 11/13/12 1051 11/13/12 1314   11/13/12 0600  ceFAZolin (ANCEF) IVPB 2 g/50 mL premix  Status:  Discontinued     2 g 100 mL/hr over 30 Minutes Intravenous On call to O.R. 11/12/12 1538 11/13/12 1314   11/12/12 1328  polymyxin B 500,000 Units, bacitracin 50,000 Units in sodium chloride irrigation 0.9 % 500 mL irrigation  Status:  Discontinued       As needed 11/12/12 1328 11/12/12 1410   11/10/12 1400  cefTRIAXone (ROCEPHIN) 1 g in dextrose 5 % 50 mL IVPB  Status:  Discontinued     1 g 100 mL/hr over 30 Minutes Intravenous Every 24 hours 11/10/12 1221 11/13/12 1038   11/09/12 0000  ceFAZolin (ANCEF) IVPB 2 g/50 mL premix  Status:  Discontinued     2 g 100 mL/hr over 30 Minutes Intravenous 30 min pre-op 11/08/12 1344 11/12/12 1435   11/08/12 0830  ceFAZolin (ANCEF) IVPB 2 g/50 mL premix  Status:  Discontinued     2 g 100 mL/hr over 30 Minutes Intravenous On call to O.R. 11/08/12 0826 11/08/12 1344   11/08/12 0800  vancomycin (VANCOCIN) IVPB 1000 mg/200 mL premix  Status:  Discontinued     1,000 mg 200 mL/hr over 60 Minutes Intravenous Every 12 hours 11/07/12 2004 11/08/12 1600   11/08/12 0400  piperacillin-tazobactam (ZOSYN) IVPB 3.375 g  Status:  Discontinued     3.375 g 12.5 mL/hr over 240 Minutes Intravenous Every 8 hours 11/07/12 2005 11/08/12 1600   11/07/12 1700  vancomycin (VANCOCIN) IVPB 1000 mg/200 mL premix     1,000 mg 200 mL/hr over 60 Minutes Intravenous  Once 11/07/12 1603 11/07/12 2122   11/07/12 1700  piperacillin-tazobactam (ZOSYN) IVPB 3.375 g     3.375 g 100 mL/hr over 30 Minutes  Intravenous  Once 11/07/12 1603 11/07/12 2053   11/07/12 1330  cefTRIAXone (ROCEPHIN) injection 1 g     1 g Intramuscular  Once 11/07/12 1320 11/07/12 1355      Medications: Scheduled Meds: . amLODipine  5 mg Oral Daily  . baclofen  20 mg Oral QID  . docusate sodium  100 mg Oral BID  . famotidine  20 mg Oral BID  . feeding supplement  237 mL Oral BID BM  . ferrous sulfate  325 mg Oral BID WC  . metoCLOPramide  10 mg Oral TID AC  . multivitamin with minerals  1 tablet Oral q morning - 10a  . nutrition supplement  1 packet Oral BID BM  . sodium chloride  10-40 mL Intracatheter Q12H  . vitamin C  500 mg Oral q morning - 10a  . zinc sulfate  220 mg Oral q morning - 10a   Continuous Infusions: . lactated ringers Stopped (11/12/12 1415)   PRN Meds:.acetaminophen, albuterol, fentaNYL, HYDROcodone-acetaminophen, morphine injection, ondansetron, sodium chloride   Objective: Weight change:   Intake/Output Summary (Last 24 hours) at 11/13/12 1315 Last data filed at 11/13/12 1048  Gross per 24 hour  Intake   1650 ml  Output  1750 ml  Net   -100 ml   Blood pressure 108/72, pulse 93, temperature 98.7 F (37.1 C), temperature source Oral, resp. rate 20, height 5\' 7"  (1.702 m), weight 231 lb 0.7 oz (104.8 kg), SpO2 99.00%. Temp:  [97.5 F (36.4 C)-98.7 F (37.1 C)] 98.7 F (37.1 C) (07/01 0600) Pulse Rate:  [63-93] 93 (07/01 0600) Resp:  [11-20] 20 (07/01 0600) BP: (92-134)/(53-79) 108/72 mmHg (07/01 0600) SpO2:  [99 %-100 %] 99 % (07/01 0600)  Physical Exam: General: Alert and awake, oriented x3, not in any acute distress. HEENT: anicteric sclera, pupils reactive to light and accommodation, EOMI CVS regular rate, normal r,  no murmur rubs or gallops Wounds : not examined today Neuro:  Paraplegic  Lab Results:  Recent Labs  11/12/12 0525 11/13/12 1155  WBC 13.2* 12.9*  HGB 9.1* 8.5*  HCT 29.6* 27.8*  PLT 530* 497*    BMET  Recent Labs  11/12/12 0525  11/13/12 1155  NA 136 136  K 3.9 3.8  CL 101 101  CO2 28 29  GLUCOSE 97 104*  BUN 10 10  CREATININE 0.36* 0.37*  CALCIUM 9.2 8.9    Micro Results: Recent Results (from the past 240 hour(s))  CULTURE, BLOOD (ROUTINE X 2)     Status: None   Collection Time    11/07/12  4:00 PM      Result Value Range Status   Specimen Description BLOOD LEFT HAND   Final   Special Requests BOTTLES DRAWN AEROBIC ONLY 2CC   Final   Culture  Setup Time 11/08/2012 01:12   Final   Culture     Final   Value:        BLOOD CULTURE RECEIVED NO GROWTH TO DATE CULTURE WILL BE HELD FOR 5 DAYS BEFORE ISSUING A FINAL NEGATIVE REPORT   Report Status PENDING   Incomplete  CULTURE, BLOOD (ROUTINE X 2)     Status: None   Collection Time    11/07/12  6:15 PM      Result Value Range Status   Specimen Description BLOOD LEFT UPPER ARM PICC   Final   Special Requests BOTTLES DRAWN AEROBIC AND ANAEROBIC   Final   Culture  Setup Time 11/08/2012 01:12   Final   Culture     Final   Value:        BLOOD CULTURE RECEIVED NO GROWTH TO DATE CULTURE WILL BE HELD FOR 5 DAYS BEFORE ISSUING A FINAL NEGATIVE REPORT   Report Status PENDING   Incomplete  URINE CULTURE     Status: None   Collection Time    11/08/12  1:55 AM      Result Value Range Status   Specimen Description URINE, SUPRAPUBIC   Final   Special Requests NONE   Final   Culture  Setup Time 11/08/2012 11:12   Final   Colony Count >=100,000 COLONIES/ML   Final   Culture     Final   Value: ENTEROBACTER CLOACAE     PSEUDOMONAS AERUGINOSA   Report Status 11/13/2012 FINAL   Final   Organism ID, Bacteria PSEUDOMONAS AERUGINOSA   Final  MRSA PCR SCREENING     Status: None   Collection Time    11/08/12  6:13 PM      Result Value Range Status   MRSA by PCR NEGATIVE  NEGATIVE Final   Comment:            The GeneXpert MRSA Assay (FDA  approved for NASAL specimens     only), is one component of a     comprehensive MRSA colonization     surveillance program.  It is not     intended to diagnose MRSA     infection nor to guide or     monitor treatment for     MRSA infections.    Studies/Results: No results found.    Assessment/Plan: Merlon Alcorta is a 47 y.o. 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 Kelly Splinter. He actually had grown a MDR pseudomonas from bone biopsy culture in April BUT DOES Not appear to have ever been treated with effective abx against this. (he had been on Invanz and fluconazole to target the MS Coag Neg staph and C parapsilosis isolated from hip aspirate at [facility] but not on drugs vs his MDR pseudomonas such as colistin.  #1   My understanding was that he was to be OFF antibiotics to increase yield on bone biopsy.  I spoke with Dr Kelly Splinter and she is willing to take the pt back to the OR for repeat surgery and bone biopsy for :  Bacterial cultures  Anerobic cultures  Fungal cultures  I was under the false impression pt was OFF abx and it would be better to have him off abx altogether to increase yield on such cultures (certainly the MDR pseudomonas should still grow but broad spectrum abx would decrease yield on isolating something else  IF he MUST be on colistin there is high risk for nephrotoxicity and high risk of failure if we are trying to target MDR organism in bone   #2 pseudomonas and Enterobacter in urine: not clear that these are pathogens and I would dc abx at this time   LOS: 6 days   Acey Lav 11/13/2012, 1:15 PM

## 2012-11-13 NOTE — Progress Notes (Signed)
Patient was made npo for today with a  Dose of ancef to OR, but there is not indication why. [name] [name] had his surgery on yesterday 6/30 and he has not discussed or consented to any additional procedures. This is likely an error.
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
## 2012-11-13 NOTE — Anesthesia Preprocedure Evaluation (Addendum)
Anesthesia Evaluation  Patient identified by MRN, date of birth, ID band Patient awake    Reviewed: Allergy & Precautions, H&P , NPO status , Patient's Chart, lab work & pertinent test results  History of Anesthesia Complications (+) MALIGNANT HYPERTHERMIA  Airway Mallampati: III TM Distance: >3 FB Neck ROM: full    Dental no notable dental hx. (+) Teeth Intact and Dental Advisory Given   Pulmonary asthma , sleep apnea and Continuous Positive Airway Pressure Ventilation , pneumonia -,  breath sounds clear to auscultation  Pulmonary exam normal       Cardiovascular Exercise Tolerance: Poor hypertension, Pt. on medications + Peripheral Vascular Disease Rhythm:regular Rate:Normal     Neuro/Psych Seizures -,  quadraplegia C5 fracture negative psych ROS   GI/Hepatic negative GI ROS, Neg liver ROS,   Endo/Other  negative endocrine ROSMorbid obesity  Renal/GU negative Renal ROS  negative genitourinary   Musculoskeletal   Abdominal (+) + obese,   Peds  Hematology negative hematology ROS (+) Blood dyscrasia, anemia ,   Anesthesia Other Findings   Reproductive/Obstetrics negative OB ROS                          Anesthesia Physical Anesthesia Plan  ASA: III  Anesthesia Plan: General   Post-op Pain Management:    Induction: Intravenous  Airway Management Planned: Oral ETT  Additional Equipment:   Intra-op Plan:   Post-operative Plan: Extubation in OR  Informed Consent: I have reviewed the patients History and Physical, chart, labs and discussed the procedure including the risks, benefits and alternatives for the proposed anesthesia with the patient or authorized representative who has indicated his/her understanding and acceptance.   Dental advisory given  Plan Discussed with: CRNA  Anesthesia Plan Comments:         Anesthesia Quick Evaluation

## 2012-11-13 NOTE — Progress Notes (Signed)
NUTRITION FOLLOW UP  Intervention:   Continue Ensure Complete BID Continue Juven BID Continue Multivitamin, Vitamin C, ferrous sulfate, and Zinc  Nutrition Dx:   Increased nutrient needs related to wound healing as evidenced by stage 4 pressure ulcer; ongoing  Goal:   Pt to meet >/= 90% of their estimated nutrition needs; being met   Monitor:   PO intake; 100% of all meals and supplements Weight; stable Labs; low hemoglobin, low RBC, high WBC Wounds; s/p irrigation and debridement   Assessment:   Pt reports that he is eating 100% of 3 meals daily and drinking Ensure BID and Juven BID. Pt underwent irrigation and debridement of sacral ulcer with placement of cell and vac on 6/30  Height: Ht Readings from Last 1 Encounters:  11/10/12 5\' 7"  (1.702 m)    Weight Status:   Wt Readings from Last 1 Encounters:  11/11/12 231 lb 0.7 oz (104.8 kg)    Re-estimated needs:  Kcal: 2100-2530  Protein: 126-147 grams  Fluid: 2.9 L  Skin: Stage 4 sacral ulcer s/p irrigation and debridement  Diet Order: General   Intake/Output Summary (Last 24 hours) at 11/13/12 1339 Last data filed at 11/13/12 1048  Gross per 24 hour  Intake   1650 ml  Output   1750 ml  Net   -100 ml    Last BM: 6/30   Labs:   Recent Labs Lab 11/11/12 0425 11/12/12 0525 11/13/12 1155  NA 142 136 136  K 3.8 3.9 3.8  CL 107 101 101  CO2 28 28 29   BUN 12 10 10   CREATININE 0.38* 0.36* 0.37*  CALCIUM 9.0 9.2 8.9  MG 2.0  --   --   GLUCOSE 102* 97 104*    CBG (last 3)  No results found for this basename: GLUCAP,  in the last 72 hours  Scheduled Meds: . amLODipine  5 mg Oral Daily  . baclofen  20 mg Oral QID  . docusate sodium  100 mg Oral BID  . famotidine  20 mg Oral BID  . feeding supplement  237 mL Oral BID BM  . ferrous sulfate  325 mg Oral BID WC  . metoCLOPramide  10 mg Oral TID AC  . multivitamin with minerals  1 tablet Oral q morning - 10a  . nutrition supplement  1 packet Oral BID  BM  . sodium chloride  10-40 mL Intracatheter Q12H  . vitamin C  500 mg Oral q morning - 10a  . zinc sulfate  220 mg Oral q morning - 10a    Continuous Infusions: . lactated ringers Stopped (11/12/12 1415)    Ian Malkin RD, LDN Inpatient Clinical Dietitian Pager: [phone] After Hours Pager: [phone]

## 2012-11-13 NOTE — Progress Notes (Signed)
[facility] PROGRESS NOTE  [name] [name] UXL:[id number] DOB: [birth year] DOA: 11/07/2012 PCP: [name], MD  Assessment/Plan:  1- Decubitus ulcer stage 4, chronic osteomyelitis right femur:  Patient presents on 6-25 with worsening drainage from wound, diaphoresis, leukocytosis.  Started on IV vancomycin and Zosyn 6-25.  Antibiotics stopped  pending biopsy. He received Ceftriaxone for UTI.  Patient had I and D on 6-30. Bone biopsy and culture was not send. Antibiotics per ID recommendation. To OR for bone biopsy per Dr Daiva Eves note. Appreciate Dr Daiva Eves help.    2-Mycrocitic Hypochromic Anemia: Hb at 6.4 on admission. S/P 2 units of PRBC 6-25. Hb today at 7.3. Continue with iron supplement. S/P 1 PRBC 6-29 preparation for surgery. Hb at 8.   3-Quadriplegic: Continue with support Care.  4-Leukocytosis: probably secondary to decubitus ulcer worsening infection, UTI. Chest x ray no active diseases. UA with too numerous to count WBC. Blood culture no growth to date.  5-UTI:  UA with too numerous to count WBC.  urine culture Gram Negative rods. Started Ceftriaxone 6-28. Received 3 days. Ceftriaxone changed to Cefepime 7-1 to cover for Pseudomonas. He received one dose of cefepime. ID discontinue antibiotics to help yield results from biopsy.  6-Hypotension: Resolved. Resume Norvasc.  lactic acid 1.4. Antibiotics stopped by ID.  7-Malnutrition: ensure, multivitamins.  8-Hypertension; Resume Norvasc.   Code Status: Full Code.  Family Communication: care discussed with patient.  Disposition Plan: remain inpatient.    Consultants:  Dr Kelly Splinter  Dr Luciana Axe.   Procedures:  none  Antibiotics:  Vancomycin 6-25// 6-26  Zosyn 6-25 // 6-26  Ceftriaxone 6-28 stopped 7-1.  HPI/Subjective: Feeling ok. No complaints.   Objective: Filed Vitals:   11/12/12 1825 11/12/12 2122 11/13/12 0600 11/13/12 1446  BP: 134/79 104/67 108/72 159/88  Pulse: 66 83 93 95  Temp:  98.3 F (36.8 C)  98.7 F (37.1 C) 99.8 F (37.7 C)  TempSrc:  Oral Oral Oral  Resp: 18 20 20 20   Height:      Weight:      SpO2: 100% 100% 99% 96%    Intake/Output Summary (Last 24 hours) at 11/13/12 1633 Last data filed at 11/13/12 1406  Gross per 24 hour  Intake   1350 ml  Output   1550 ml  Net   -200 ml   Filed Weights   11/09/12 0600 11/10/12 0600 11/11/12 0510  Weight: 104.1 kg (229 lb 8 oz) 104 kg (229 lb 4.5 oz) 104.8 kg (231 lb 0.7 oz)    Exam:   General:  No distress  Cardiovascular: S 1, S 2 RRR  Respiratory: CTA  Abdomen: bs present, distended, colostomy in place.   Musculoskeletal: no edema.   Data Reviewed: Basic Metabolic Panel:  Recent Labs Lab 11/09/12 0335 11/10/12 1140 11/11/12 0425 11/12/12 0525 11/13/12 1155  NA 136 144 142 136 136  K 3.3* 3.6 3.8 3.9 3.8  CL 103 109 107 101 101  CO2 25 27 28 28 29   GLUCOSE 102* 136* 102* 97 104*  BUN 13 10 12 10 10   CREATININE 0.46* 0.35* 0.38* 0.36* 0.37*  CALCIUM 8.7 9.0 9.0 9.2 8.9  MG  --   --  2.0  --   --    Liver Function Tests: No results found for this basename: AST, ALT, ALKPHOS, BILITOT, PROT, ALBUMIN,  in the last 168 hours No results found for this basename: LIPASE, AMYLASE,  in the last 168 hours No results found for this basename: AMMONIA,  in the last 168 hours CBC:  Recent Labs Lab 11/07/12 1815  11/09/12 0335 11/10/12 1140 11/11/12 0425 11/12/12 0525 11/13/12 1155  WBC 15.2*  < > 11.1* 11.7* 9.7 13.2* 12.9*  NEUTROABS 11.7*  --   --   --   --   --   --   HGB 6.4*  < > 7.5* 7.8* 7.3* 9.1* 8.5*  HCT 21.5*  < > 25.0* 25.1* 24.8* 29.6* 27.8*  MCV 76.5*  < > 79.6 80.4 80.5 80.7 81.3  PLT 724*  < > 541* 529* 513* 530* 497*  < > = values in this interval not displayed. Cardiac Enzymes: No results found for this basename: CKTOTAL, CKMB, CKMBINDEX, TROPONINI,  in the last 168 hours BNP (last 3 results) No results found for this basename: PROBNP,  in the last 8760 hours CBG: No results found  for this basename: GLUCAP,  in the last 168 hours  Recent Results (from the past 240 hour(s))  CULTURE, BLOOD (ROUTINE X 2)     Status: None   Collection Time    11/07/12  4:00 PM      Result Value Range Status   Specimen Description BLOOD LEFT HAND   Final   Special Requests BOTTLES DRAWN AEROBIC ONLY 2CC   Final   Culture  Setup Time 11/08/2012 01:12   Final   Culture     Final   Value:        BLOOD CULTURE RECEIVED NO GROWTH TO DATE CULTURE WILL BE HELD FOR 5 DAYS BEFORE ISSUING A FINAL NEGATIVE REPORT   Report Status PENDING   Incomplete  CULTURE, BLOOD (ROUTINE X 2)     Status: None   Collection Time    11/07/12  6:15 PM      Result Value Range Status   Specimen Description BLOOD LEFT UPPER ARM PICC   Final   Special Requests BOTTLES DRAWN AEROBIC AND ANAEROBIC   Final   Culture  Setup Time 11/08/2012 01:12   Final   Culture     Final   Value:        BLOOD CULTURE RECEIVED NO GROWTH TO DATE CULTURE WILL BE HELD FOR 5 DAYS BEFORE ISSUING A FINAL NEGATIVE REPORT   Report Status PENDING   Incomplete  URINE CULTURE     Status: None   Collection Time    11/08/12  1:55 AM      Result Value Range Status   Specimen Description URINE, SUPRAPUBIC   Final   Special Requests NONE   Final   Culture  Setup Time 11/08/2012 11:12   Final   Colony Count >=100,000 COLONIES/ML   Final   Culture     Final   Value: ENTEROBACTER CLOACAE     PSEUDOMONAS AERUGINOSA   Report Status 11/13/2012 FINAL   Final   Organism ID, Bacteria PSEUDOMONAS AERUGINOSA   Final  MRSA PCR SCREENING     Status: None   Collection Time    11/08/12  6:13 PM      Result Value Range Status   MRSA by PCR NEGATIVE  NEGATIVE Final   Comment:            The GeneXpert MRSA Assay (FDA     approved for NASAL specimens     only), is one component of a     comprehensive MRSA colonization     surveillance program. It is not     intended to diagnose MRSA  infection nor to guide or     monitor treatment for     MRSA  infections.     Studies: No results found.  Scheduled Meds: . amLODipine  5 mg Oral Daily  . baclofen  20 mg Oral QID  . docusate sodium  100 mg Oral BID  . famotidine  20 mg Oral BID  . feeding supplement  237 mL Oral BID BM  . ferrous sulfate  325 mg Oral BID WC  . metoCLOPramide  10 mg Oral TID AC  . multivitamin with minerals  1 tablet Oral q morning - 10a  . nutrition supplement  1 packet Oral BID BM  . sodium chloride  10-40 mL Intracatheter Q12H  . vitamin C  500 mg Oral q morning - 10a  . zinc sulfate  220 mg Oral q morning - 10a   Continuous Infusions: . lactated ringers Stopped (11/12/12 1415)    Active Problems:   Quadriplegia   Leukocytosis   Seizure disorder   HTN (hypertension)   Microcytic anemia   OSA on CPAP    Time spent: 25 minutes    Arriyanna Mersch  [facility] Pager [phone]. If 7PM-7AM, please contact night-coverage at [email or web address], password [facility] 11/13/2012, 4:33 PM  LOS: 6 days

## 2012-11-14 ENCOUNTER — Encounter (HOSPITAL_COMMUNITY)
Admission: EM | Disposition: A | Discharge status: Skilled Nursing Facility | Payer: Self-pay | Source: Home / Self Care | Attending: Internal Medicine

## 2012-11-14 ENCOUNTER — Encounter (HOSPITAL_COMMUNITY): Payer: Self-pay | Admitting: Plastic Surgery

## 2012-11-14 ENCOUNTER — Encounter (HOSPITAL_COMMUNITY): Payer: Self-pay | Admitting: Anesthesiology

## 2012-11-14 ENCOUNTER — Inpatient Hospital Stay (HOSPITAL_COMMUNITY): Payer: Medicare Other | Admitting: Anesthesiology

## 2012-11-14 ENCOUNTER — Other Ambulatory Visit: Payer: Self-pay | Admitting: Plastic Surgery

## 2012-11-14 DIAGNOSIS — L89329 Pressure ulcer of left buttock, unspecified stage: Secondary | ICD-10-CM

## 2012-11-14 HISTORY — PX: INCISION AND DRAINAGE OF WOUND: SHX1803

## 2012-11-14 LAB — CULTURE, BLOOD (ROUTINE X 2): Culture: NO GROWTH

## 2012-11-14 SURGERY — IRRIGATION AND DEBRIDEMENT WOUND
Anesthesia: General | Site: Hip | Wound class: Dirty or Infected

## 2012-11-14 MED ORDER — 0.9 % SODIUM CHLORIDE (POUR BTL) OPTIME
TOPICAL | Status: DC | PRN
  Administered 2012-11-14: 1000 mL

## 2012-11-14 MED ORDER — HYDROMORPHONE HCL PF 1 MG/ML IJ SOLN: 0.2500 mg | INTRAMUSCULAR | Status: DC | PRN

## 2012-11-14 MED ORDER — GLYCOPYRROLATE 0.2 MG/ML IJ SOLN
INTRAMUSCULAR | Status: DC | PRN
  Administered 2012-11-14: .8 mg via INTRAVENOUS

## 2012-11-14 MED ORDER — ROCURONIUM BROMIDE 100 MG/10ML IV SOLN
INTRAVENOUS | Status: DC | PRN
  Administered 2012-11-14: 40 mg via INTRAVENOUS

## 2012-11-14 MED ORDER — LIDOCAINE HCL (CARDIAC) 20 MG/ML IV SOLN
INTRAVENOUS | Status: DC | PRN
  Administered 2012-11-14: 80 mg via INTRAVENOUS

## 2012-11-14 MED ORDER — ONDANSETRON HCL 4 MG/2ML IJ SOLN
INTRAMUSCULAR | Status: DC | PRN
  Administered 2012-11-14: 4 mg via INTRAVENOUS

## 2012-11-14 MED ORDER — SODIUM CHLORIDE 0.9 % IR SOLN
Status: DC | PRN
  Administered 2012-11-14: 10:00:00

## 2012-11-14 MED ORDER — FENTANYL CITRATE 0.05 MG/ML IJ SOLN
INTRAMUSCULAR | Status: DC | PRN
  Administered 2012-11-14 (×3): 50 ug via INTRAVENOUS

## 2012-11-14 MED ORDER — LACTATED RINGERS IV SOLN: INTRAVENOUS | Status: DC

## 2012-11-14 MED ORDER — EPHEDRINE SULFATE 50 MG/ML IJ SOLN
INTRAMUSCULAR | Status: DC | PRN
  Administered 2012-11-14: 10 mg via INTRAVENOUS
  Administered 2012-11-14: 5 mg via INTRAVENOUS

## 2012-11-14 MED ORDER — MIDAZOLAM HCL 5 MG/5ML IJ SOLN
INTRAMUSCULAR | Status: DC | PRN
  Administered 2012-11-14: 2 mg via INTRAVENOUS

## 2012-11-14 MED ORDER — PROPOFOL 10 MG/ML IV BOLUS
INTRAVENOUS | Status: DC | PRN
  Administered 2012-11-14: 200 mg via INTRAVENOUS

## 2012-11-14 MED ORDER — NEOSTIGMINE METHYLSULFATE 1 MG/ML IJ SOLN
INTRAMUSCULAR | Status: DC | PRN
  Administered 2012-11-14: 5 mg via INTRAVENOUS

## 2012-11-14 MED ORDER — LACTATED RINGERS IV SOLN
INTRAVENOUS | Status: DC | PRN
  Administered 2012-11-14: 08:00:00 via INTRAVENOUS

## 2012-11-14 MED ORDER — PROMETHAZINE HCL 25 MG/ML IJ SOLN: 6.2500 mg | INTRAMUSCULAR | Status: DC | PRN

## 2012-11-14 SURGICAL SUPPLY — 54 items
ADH SKN CLS APL DERMABOND .7 (GAUZE/BANDAGES/DRESSINGS)
BAG SPEC THK2 15X12 ZIP CLS (MISCELLANEOUS)
BAG ZIPLOCK 12X15 (MISCELLANEOUS) ×1 IMPLANT
CLOTH BEACON ORANGE TIMEOUT ST (SAFETY) ×2 IMPLANT
CONT SPECI 4OZ STER CLIK (MISCELLANEOUS) ×1 IMPLANT
DERMABOND ADVANCED (GAUZE/BANDAGES/DRESSINGS)
DERMABOND ADVANCED .7 DNX12 (GAUZE/BANDAGES/DRESSINGS) ×1 IMPLANT
DRAPE INCISE IOBAN 85X60 (DRAPES) ×2 IMPLANT
DRAPE LAPAROSCOPIC ABDOMINAL (DRAPES) ×1 IMPLANT
DRAPE ORTHO SPLIT 77X108 STRL (DRAPES)
DRAPE SURG 17X11 SM STRL (DRAPES) ×1 IMPLANT
DRAPE SURG ORHT 6 SPLT 77X108 (DRAPES) ×2 IMPLANT
DRAPE U-SHAPE 47X51 STRL (DRAPES) ×1 IMPLANT
DRSG AQUACEL AG ADV 3.5X10 (GAUZE/BANDAGES/DRESSINGS) ×2 IMPLANT
DRSG EMULSION OIL 3X16 NADH (GAUZE/BANDAGES/DRESSINGS) ×1 IMPLANT
DRSG PAD ABDOMINAL 8X10 ST (GAUZE/BANDAGES/DRESSINGS) ×1 IMPLANT
DRSG TEGADERM 4X4.75 (GAUZE/BANDAGES/DRESSINGS) ×2 IMPLANT
DURAPREP 26ML APPLICATOR (WOUND CARE) ×1 IMPLANT
ELECT REM PT RETURN 9FT ADLT (ELECTROSURGICAL) ×2
ELECTRODE REM PT RTRN 9FT ADLT (ELECTROSURGICAL) ×1 IMPLANT
EVACUATOR 1/8 PVC DRAIN (DRAIN) ×1 IMPLANT
EVACUATOR 3/16  PVC DRAIN (DRAIN)
EVACUATOR 3/16 PVC DRAIN (DRAIN) ×1 IMPLANT
GAUZE SPONGE 2X2 8PLY STRL LF (GAUZE/BANDAGES/DRESSINGS) ×1 IMPLANT
GAUZE XEROFORM 5X9 LF (GAUZE/BANDAGES/DRESSINGS) ×1 IMPLANT
GLOVE BIOGEL PI IND STRL 7.5 (GLOVE) ×1 IMPLANT
GLOVE BIOGEL PI IND STRL 8 (GLOVE) ×1 IMPLANT
GLOVE BIOGEL PI INDICATOR 7.5 (GLOVE)
GLOVE BIOGEL PI INDICATOR 8 (GLOVE)
GLOVE ECLIPSE 8.0 STRL XLNG CF (GLOVE) IMPLANT
GLOVE ORTHO TXT STRL SZ7.5 (GLOVE) ×2 IMPLANT
GLOVE SURG ORTHO 8.0 STRL STRW (GLOVE) ×1 IMPLANT
GOWN BRE IMP PREV XXLGXLNG (GOWN DISPOSABLE) ×2 IMPLANT
GOWN STRL NON-REIN LRG LVL3 (GOWN DISPOSABLE) ×3 IMPLANT
HANDPIECE INTERPULSE COAX TIP (DISPOSABLE)
IV NS IRRIG 3000ML ARTHROMATIC (IV SOLUTION) ×1 IMPLANT
KIT BASIN OR (CUSTOM PROCEDURE TRAY) ×3 IMPLANT
MANIFOLD NEPTUNE II (INSTRUMENTS) ×1 IMPLANT
MICROMATRIX 500MG (Tissue) ×2 IMPLANT
PACK GENERAL/GYN (CUSTOM PROCEDURE TRAY) ×1 IMPLANT
PACK TOTAL JOINT (CUSTOM PROCEDURE TRAY) ×1 IMPLANT
POSITIONER SURGICAL ARM (MISCELLANEOUS) ×3 IMPLANT
SET HNDPC FAN SPRY TIP SCT (DISPOSABLE) ×1 IMPLANT
SOLUTION PARTIC MCRMTRX 500MG (Tissue) IMPLANT
SPONGE GAUZE 2X2 STER 10/PKG (GAUZE/BANDAGES/DRESSINGS)
SPONGE GAUZE 4X4 12PLY (GAUZE/BANDAGES/DRESSINGS) ×3 IMPLANT
STAPLER VISISTAT 35W (STAPLE) ×2 IMPLANT
SUT MNCRL AB 4-0 PS2 18 (SUTURE) ×1 IMPLANT
SUT VIC AB 1 CT1 36 (SUTURE) ×2 IMPLANT
SUT VIC AB 2-0 CT1 27 (SUTURE)
SUT VIC AB 2-0 CT1 TAPERPNT 27 (SUTURE) ×2 IMPLANT
SWAB COLLECTION DEVICE MRSA (MISCELLANEOUS) ×1 IMPLANT
TOWEL OR 17X26 10 PK STRL BLUE (TOWEL DISPOSABLE) ×4 IMPLANT
TUBE ANAEROBIC SPECIMEN COL (MISCELLANEOUS) ×1 IMPLANT

## 2012-11-14 NOTE — Op Note (Signed)
[name] [name], COTRONEO NO.:  [id number]  MEDICAL RECORD NO.:  [id number]  LOCATION:  1502                         FACILITY:  [facility]  PHYSICIAN:  Wayland Denis, DO      DATE OF BIRTH:  1965/09/17  DATE OF PROCEDURE:  11/12/2012 DATE OF DISCHARGE:                              OPERATIVE REPORT   PREOPERATIVE DIAGNOSIS:  Large stage IV pressure ulcers of the sacrum and bilateral ischium.  POSTOPERATIVE DIAGNOSIS:  Large stage IV pressure ulcers of the sacrum and bilateral ischium.  PROCEDURE:  Irrigation and debridement of large sacral and ischial ulcers with debridement of skin, subcutaneous tissue, and muscle with placement of ACell powder and ACell sheet, powder 2 g and 3 x 5 x 7 sheet.  ATTENDING SURGEON:  Wayland Denis, DO  ANESTHESIA:  General.  INDICATION FOR PROCEDURE:  The patient is a 47 year old gentleman who is paraplegic.  He has multiple sacral ischial ulcers.  He was admitted for fevers and anemia.  Transfusion was given and consent was confirmed.  DESCRIPTION OF PROCEDURE:  The patient was taken to the operating room. General anesthesia was administered.  Once adequate, the patient was placed on the operating room table in the prone position.  He was prepped and draped in the usual sterile fashion.  Time-out was called. All information was confirmed to be correct.  A combination of a 10 blade and the Bovie were used to debride the skin, subcutaneous tissue, and muscle.  There was good blood flow.  Hemostasis was achieved with electrocautery.  Once the wound bed was debrided, it was irrigated with antibiotic solution and normal saline with a Pulsavac.  The ACell powder was then applied followed by the ACell sheet that was prepared according to the manufacture's guidelines.  The sheet was secured to the wound bed with 5-0 Vicryl.  A VAC sponge was placed over  Adaptic and Hydrogel on the left ischial area.  The remaining areas were covered with  Adaptic, Hydrogel, and ABDs.  The patient tolerated the procedure well.  There were no complications.  He was awoken and taken to recovery room in stable condition.     Wayland Denis, DO     CS/MEDQ  D:  11/14/2012  T:  11/14/2012  Job:  [id number]

## 2012-11-14 NOTE — Interval H&P Note (Signed)
History and Physical Interval Note:  11/14/2012 11:22 AM  [name] [name]  has presented today for surgery, with the diagnosis of chronic pelvic osteomylitis sacral ulcer   The various methods of treatment have been discussed with the patient and family. After consideration of risks, benefits and other options for treatment, the patient has consented to  Procedure(s): BONE BIOSPY OF RIGHT HIP, Wound vac change (N/A) as a surgical intervention .  The patient's history has been reviewed, patient examined, no change in status, stable for surgery.  I have reviewed the patient's chart and labs.  Questions were answered to the patient's satisfaction.     SANGER,CLAIRE

## 2012-11-14 NOTE — Brief Op Note (Signed)
11/07/2012 - 11/14/2012  10:02 AM  PATIENT:  [name] [name]  47 y.o. male  PRE-OPERATIVE DIAGNOSIS:  chronic pelvic osteomylitis sacral ulcer   POST-OPERATIVE DIAGNOSIS:  same  PROCEDURE:  Procedure(s): BONE BIOSPY OF RIGHT HIP (Right)  SURGEON:  Surgeon(s) and Role:    * Kolston Lacount Sanger, DO - Primary  PHYSICIAN ASSISTANT: none  ASSISTANTS: none   ANESTHESIA:   general  EBL:  Total I/O In: -  Out: 450 [Urine:400; Blood:50]  BLOOD ADMINISTERED:none  DRAINS: none   LOCAL MEDICATIONS USED:  NONE  SPECIMEN:  Source of Specimen:  right femur for micro  DISPOSITION OF SPECIMEN:  micro  COUNTS:  YES  TOURNIQUET:  * No tourniquets in log *  DICTATION: .Dragon Dictation  PLAN OF CARE: Admit to inpatient   PATIENT DISPOSITION:  PACU - hemodynamically stable.   Delay start of Pharmacological VTE agent (>24hrs) due to surgical blood loss or risk of bleeding: no

## 2012-11-14 NOTE — Progress Notes (Signed)
CSW provided bed offers to patient's care giver. Will follow for choice.  Jonesha Tsuchiya C. Chukwuemeka Artola MSW, LCSW [phone]

## 2012-11-14 NOTE — Transfer of Care (Signed)
Immediate Anesthesia Transfer of Care Note  Patient: [name] [name]  Procedure(s) Performed: Procedure(s): BONE BIOSPY OF RIGHT HIP, Wound vac change (N/A)  Patient Location: PACU  Anesthesia Type:General  Level of Consciousness: awake, alert , oriented and patient cooperative  Airway & Oxygen Therapy: Patient Spontanous Breathing and Patient connected to face mask oxygen  Post-op Assessment: Report given to PACU RN and Post -op Vital signs reviewed and stable  Post vital signs: Reviewed and stable  Complications: No apparent anesthesia complications

## 2012-11-14 NOTE — Interval H&P Note (Signed)
History and Physical Interval Note:  11/14/2012 7:01 AM  [name] [name]  has presented today for surgery, with the diagnosis of chronic pelvic osteomylitis sacral ulcer   The various methods of treatment have been discussed with the patient and family. After consideration of risks, benefits and other options for treatment, the patient has consented to  Procedure(s): BONE BIOSPY OF RIGHT HIP (Right) as a surgical intervention .  The patient's history has been reviewed, patient examined, no change in status, stable for surgery.  I have reviewed the patient's chart and labs.  Questions were answered to the patient's satisfaction.     SANGER,CLAIRE

## 2012-11-14 NOTE — Progress Notes (Signed)
10 am medications not given due to patient in the operating room having a procedure

## 2012-11-14 NOTE — Anesthesia Postprocedure Evaluation (Signed)
Anesthesia Post Note  Patient: [name] [name]  Procedure(s) Performed: Procedure(s) (LRB): BONE BIOSPY OF RIGHT HIP, Wound vac change (N/A)  Anesthesia type: General  Patient location: PACU  Post pain: Pain level controlled  Post assessment: Post-op Vital signs reviewed  Last Vitals:  Filed Vitals:   11/14/12 1131  BP: 136/91  Pulse: 84  Temp: 36.4 C  Resp: 18    Post vital signs: Reviewed  Level of consciousness: sedated  Complications: No apparent anesthesia complications

## 2012-11-14 NOTE — Progress Notes (Addendum)
Received from PACU alert, no complaints of pain at this time .  VAC in place, suctioning well established, IV in progress, given po beverage.  Family in room with patient.

## 2012-11-14 NOTE — Progress Notes (Signed)
Placed pt on CPAP machine. Patient tolerating well at this time.

## 2012-11-14 NOTE — Progress Notes (Signed)
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 recently completed a course of vancomycin and invanz following I&D  Of the sacral decub.  Patient started on IV vanco and zosyn on 6/25 . I&D done on 6/30. Patient taken off abx. As per ID recommendation patient  taken to OR today for bone bx and culture.  .  Assessment/Plan:   stage 4  Decubitus ulcer, chronic osteomyelitis right femur:  -Patient presents on 6/25 with worsening drainage from wound, diaphoresis, leukocytosis.  -Started on IV vancomycin and Zosyn empirically. Antibiotics now stopped pending bone  biopsy. Also  received Ceftriaxone for UTI.  Patient had I and D on 6/ 30 . cx not sent. Bone bx per IR recommendation done today. Had irrigation and debridement of the ulcer with placement of Acell powder and Acell sheet.   Mycrocitic  Anemia:  Hb  6.4 on admission. Received  2 units of PRBC on 6/25 and 1 u on 6/29. Hb stable. Continue with iron supplement.    C5 Quadriplegic:  Continue with support Care.   4-Leukocytosis:  Likely  secondary to decubitus ulcer worsening infection, UTI. cx so far negative. CXR normal  5-UTI:  urine culture Gram Negative rods.  Received 3 days. Of rocephin abx now held with pending bone bx  6-Hypotension:  Resolved. Resume Norvasc.   7-Malnutrition: continue  Ensure.   Code Status: Full Code.  Family Communication: none at bedside Disposition Plan: pending bone bx results.   Consultants:  Dr Kelly Splinter  Dr Zenaida Niece dam  Procedures:  none Antibiotics:  Vancomycin 6-25// 6-26  Zosyn 6-25 // 6-26  Ceftriaxone 6-28 stopped 7-1.    HPI/Subjective: Patent seen after returning from OR. No issues  Objective: Filed Vitals:   11/14/12 1045  11/14/12 1100 11/14/12 1131 11/14/12 1514  BP: 128/81 117/79 136/91 117/73  Pulse: 82 93 84 92  Temp:  97.5 F (36.4 C) 97.6 F (36.4 C) 98.3 F (36.8 C)  TempSrc:   Oral Oral  Resp: 11 14 18 18   Height:      Weight:      SpO2: 100% 100% 98% 97%    Intake/Output Summary (Last 24 hours) at 11/14/12 1621 Last data filed at 11/14/12 1147  Gross per 24 hour  Intake   2405 ml  Output   5000 ml  Net  -2595 ml   Filed Weights   11/09/12 0600 11/10/12 0600 11/11/12 0510  Weight: 104.1 kg (229 lb 8 oz) 104 kg (229 lb 4.5 oz) 104.8 kg (231 lb 0.7 oz)    Exam:   General: middle aged obese male in NAD   HEENT: No pallor , moist mucosa  Chest: clear b/l, no added sounds  Cardiovascular: NS1&S2, no murmurs, rubs or gallop  Abdomen: soft, NT, 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  Musculoskeletal: quadriplegic ., stage IV pressure ulcers over sacrum and ischium   CNS: AAOX3  Data Reviewed: Basic Metabolic Panel:  Recent Labs Lab 11/09/12 0335 11/10/12 1140 11/11/12 0425 11/12/12 0525 11/13/12 1155  NA 136 144 142 136 136  K 3.3* 3.6 3.8 3.9 3.8  CL 103 109 107 101 101  CO2 25 27 28 28 29   GLUCOSE 102* 136* 102* 97 104*  BUN 13 10 12 10 10   CREATININE 0.46* 0.35*  0.38* 0.36* 0.37*  CALCIUM 8.7 9.0 9.0 9.2 8.9  MG  --   --  2.0  --   --    Liver Function Tests: No results found for this basename: AST, ALT, ALKPHOS, BILITOT, PROT, ALBUMIN,  in the last 168 hours No results found for this basename: LIPASE, AMYLASE,  in the last 168 hours No results found for this basename: AMMONIA,  in the last 168 hours CBC:  Recent Labs Lab 11/07/12 1815  11/09/12 0335 11/10/12 1140 11/11/12 0425 11/12/12 0525 11/13/12 1155  WBC 15.2*  < > 11.1* 11.7* 9.7 13.2* 12.9*  NEUTROABS 11.7*  --   --   --   --   --   --   HGB 6.4*  < > 7.5* 7.8* 7.3* 9.1* 8.5*  HCT 21.5*  < > 25.0* 25.1* 24.8* 29.6* 27.8*  MCV 76.5*  < > 79.6 80.4 80.5 80.7 81.3  PLT 724*  < > 541*  529* 513* 530* 497*  < > = values in this interval not displayed. Cardiac Enzymes: No results found for this basename: CKTOTAL, CKMB, CKMBINDEX, TROPONINI,  in the last 168 hours BNP (last 3 results) No results found for this basename: PROBNP,  in the last 8760 hours CBG: No results found for this basename: GLUCAP,  in the last 168 hours  Recent Results (from the past 240 hour(s))  CULTURE, BLOOD (ROUTINE X 2)     Status: None   Collection Time    11/07/12  4:00 PM      Result Value Range Status   Specimen Description BLOOD LEFT HAND   Final   Special Requests BOTTLES DRAWN AEROBIC ONLY 2CC   Final   Culture  Setup Time 11/08/2012 01:12   Final   Culture NO GROWTH 5 DAYS   Final   Report Status 11/14/2012 FINAL   Final  CULTURE, BLOOD (ROUTINE X 2)     Status: None   Collection Time    11/07/12  6:15 PM      Result Value Range Status   Specimen Description BLOOD LEFT UPPER ARM PICC   Final   Special Requests BOTTLES DRAWN AEROBIC AND ANAEROBIC   Final   Culture  Setup Time 11/08/2012 01:12   Final   Culture NO GROWTH 5 DAYS   Final   Report Status 11/14/2012 FINAL   Final  URINE CULTURE     Status: None   Collection Time    11/08/12  1:55 AM      Result Value Range Status   Specimen Description URINE, SUPRAPUBIC   Final   Special Requests NONE   Final   Culture  Setup Time 11/08/2012 11:12   Final   Colony Count >=100,000 COLONIES/ML   Final   Culture     Final   Value: ENTEROBACTER CLOACAE     PSEUDOMONAS AERUGINOSA   Report Status 11/13/2012 FINAL   Final   Organism ID, Bacteria PSEUDOMONAS AERUGINOSA   Final  MRSA PCR SCREENING     Status: None   Collection Time    11/08/12  6:13 PM      Result Value Range Status   MRSA by PCR NEGATIVE  NEGATIVE Final   Comment:            The GeneXpert MRSA Assay (FDA     approved for NASAL specimens     only), is one component of a     comprehensive MRSA colonization     surveillance  program. It is not     intended to  diagnose MRSA     infection nor to guide or     monitor treatment for     MRSA infections.     Studies: No results found.  Scheduled Meds: . amLODipine  5 mg Oral Daily  . baclofen  20 mg Oral QID  . docusate sodium  100 mg Oral BID  . famotidine  20 mg Oral BID  . feeding supplement  237 mL Oral BID BM  . ferrous sulfate  325 mg Oral BID WC  . metoCLOPramide  10 mg Oral TID AC  . multivitamin with minerals  1 tablet Oral q morning - 10a  . nutrition supplement  1 packet Oral BID BM  . sodium chloride  10-40 mL Intracatheter Q12H  . vitamin C  500 mg Oral q morning - 10a  . zinc sulfate  220 mg Oral q morning - 10a   Continuous Infusions:     Time spent: 25 MINUTES    [name] [name]  [facility] Pager [phone] If 7PM-7AM, please contact night-coverage at [email or web address], password [facility] 11/14/2012, 4:21 PM  LOS: 7 days

## 2012-11-14 NOTE — Op Note (Signed)
[name] [name], [name] [name] NO.:  [id number]  MEDICAL RECORD NO.:  [id number]  LOCATION:  1502                         FACILITY:  [facility]  PHYSICIAN:  Wayland Denis, DO      DATE OF BIRTH:  August 05, 1965  DATE OF PROCEDURE:  11/14/2012 DATE OF DISCHARGE:                              OPERATIVE REPORT   PREOPERATIVE DIAGNOSIS:  Large stage IV pressure ulcers of the sacrum and bilateral ischium.  POSTOPERATIVE DIAGNOSIS:  Large stage IV pressure ulcers of the sacrum and bilateral ischium.  PROCEDURE:  Irrigation and debridement of large sacral and ischial ulcers with debridement of skin, subcutaneous tissue, and muscle and bone with placement of ACell powder powder 500mg .  ATTENDING SURGEON:  Bryelle Spiewak Sanger, DO  ANESTHESIA:  General.  INDICATION FOR PROCEDURE:  The patient is a 47 year old gentleman who is paraplegic.  He has multiple sacral ischial ulcers.  He was admitted for fevers and anemia.  Cconsent was confirmed.  DESCRIPTION OF PROCEDURE:  The patient was taken to the operating room. General anesthesia was administered.  Once adequate, the patient was placed on the operating room table in the prone position.  He was prepped and draped in the usual sterile fashion.  Time-out was called. All information was confirmed to be correct.  A combination of a 10 blade and the Bovie were used to debride the skin, subcutaneous tissue, and muscle of the left ischial ulcer.  There was good blood flow.  Hemostasis was achieved with electrocautery.  Once the wound bed was debrided, it was irrigated with antibiotic solution and normal saline.  The right ischial area had bone that was debrided and a second deeper portion was sent to microbiology. The sacral and right ischial ulcer were than covered with ABDs. The ACell powder was then applied to the left ischial area.  Adaptic and Hydrogel was placed followed by the VAC on the left ischial area.  The remaining areas were covered with kerlex  wet to dry dressing.  The patient tolerated the procedure well.  There were no complications.  He was awoken and taken to recovery room in stable condition.     Wayland Denis, DO     CS/MEDQ  D:  11/14/2012  T:  11/14/2012  Job:  [id number]

## 2012-11-14 NOTE — Preoperative (Signed)
Beta Blockers   Reason not to administer Beta Blockers:Not Applicable 

## 2012-11-14 NOTE — Progress Notes (Signed)
[facility]   OFF abx at present    1  Day of cefepime  4  days of ceftriaxone 2 days zosyn 2 days vancomycin  Subjective: Successfully had I and D with bone for culture thanks to Dr. Kelly Splinter   Antibiotics:  Anti-infectives   Start     Dose/Rate Route Frequency Ordered Stop   11/14/12 1010  polymyxin B 500,000 Units, bacitracin 50,000 Units in sodium chloride irrigation 0.9 % 500 mL irrigation  Status:  Discontinued       As needed 11/14/12 1011 11/14/12 1027   11/13/12 1200  ceFEPIme (MAXIPIME) 2 g in dextrose 5 % 50 mL IVPB  Status:  Discontinued     2 g 100 mL/hr over 30 Minutes Intravenous Every 12 hours 11/13/12 1051 11/13/12 1314   11/13/12 0600  ceFAZolin (ANCEF) IVPB 2 g/50 mL premix  Status:  Discontinued     2 g 100 mL/hr over 30 Minutes Intravenous On call to O.R. 11/12/12 1538 11/13/12 1314   11/12/12 1328  polymyxin B 500,000 Units, bacitracin 50,000 Units in sodium chloride irrigation 0.9 % 500 mL irrigation  Status:  Discontinued       As needed 11/12/12 1328 11/12/12 1410   11/10/12 1400  cefTRIAXone (ROCEPHIN) 1 g in dextrose 5 % 50 mL IVPB  Status:  Discontinued     1 g 100 mL/hr over 30 Minutes Intravenous Every 24 hours 11/10/12 1221 11/13/12 1038   11/09/12 0000  ceFAZolin (ANCEF) IVPB 2 g/50 mL premix  Status:  Discontinued     2 g 100 mL/hr over 30 Minutes Intravenous 30 min pre-op 11/08/12 1344 11/12/12 1435   11/08/12 0830  ceFAZolin (ANCEF) IVPB 2 g/50 mL premix  Status:  Discontinued     2 g 100 mL/hr over 30 Minutes Intravenous On call to O.R. 11/08/12 0826 11/08/12 1344   11/08/12 0800  vancomycin (VANCOCIN) IVPB 1000 mg/200 mL premix  Status:  Discontinued     1,000 mg 200 mL/hr over 60 Minutes Intravenous Every 12 hours 11/07/12 2004 11/08/12 1600   11/08/12 0400  piperacillin-tazobactam (ZOSYN) IVPB 3.375 g  Status:  Discontinued     3.375 g 12.5 mL/hr over 240 Minutes Intravenous Every 8 hours 11/07/12 2005  11/08/12 1600   11/07/12 1700  vancomycin (VANCOCIN) IVPB 1000 mg/200 mL premix     1,000 mg 200 mL/hr over 60 Minutes Intravenous  Once 11/07/12 1603 11/07/12 2122   11/07/12 1700  piperacillin-tazobactam (ZOSYN) IVPB 3.375 g     3.375 g 100 mL/hr over 30 Minutes Intravenous  Once 11/07/12 1603 11/07/12 2053   11/07/12 1330  cefTRIAXone (ROCEPHIN) injection 1 g     1 g Intramuscular  Once 11/07/12 1320 11/07/12 1355      Medications: Scheduled Meds: . amLODipine  5 mg Oral Daily  . baclofen  20 mg Oral QID  . docusate sodium  100 mg Oral BID  . famotidine  20 mg Oral BID  . feeding supplement  237 mL Oral BID BM  . ferrous sulfate  325 mg Oral BID WC  . metoCLOPramide  10 mg Oral TID AC  . multivitamin with minerals  1 tablet Oral q morning - 10a  . nutrition supplement  1 packet Oral BID BM  . sodium chloride  10-40 mL Intracatheter Q12H  . vitamin C  500 mg Oral q morning - 10a  . zinc sulfate  220 mg Oral q morning - 10a  Continuous Infusions:   PRN Meds:.acetaminophen, albuterol, HYDROcodone-acetaminophen, morphine injection, ondansetron, sodium chloride   Objective: Weight change:   Intake/Output Summary (Last 24 hours) at 11/14/12 1451 Last data filed at 11/14/12 1147  Gross per 24 hour  Intake   2405 ml  Output   5000 ml  Net  -2595 ml   Blood pressure 136/91, pulse 84, temperature 97.6 F (36.4 C), temperature source Oral, resp. rate 18, height 5\' 7"  (1.702 m), weight 231 lb 0.7 oz (104.8 kg), SpO2 98.00%. Temp:  [97.5 F (36.4 C)-99.1 F (37.3 C)] 97.6 F (36.4 C) (07/02 1131) Pulse Rate:  [65-104] 84 (07/02 1131) Resp:  [11-22] 18 (07/02 1131) BP: (117-190)/(79-98) 136/91 mmHg (07/02 1131) SpO2:  [96 %-100 %] 98 % (07/02 1131)  Physical Exam: General: Alert and awake, oriented x3, not in any acute distress. HEENT: anicteric sclera, pupils reactive to light and accommodation, EOMI CVS regular rate, normal r,  no murmur rubs or gallops Wounds : not  examined today Neuro:  Paraplegic  Lab Results:  Recent Labs  11/12/12 0525 11/13/12 1155  WBC 13.2* 12.9*  HGB 9.1* 8.5*  HCT 29.6* 27.8*  PLT 530* 497*    BMET  Recent Labs  11/12/12 0525 11/13/12 1155  NA 136 136  K 3.9 3.8  CL 101 101  CO2 28 29  GLUCOSE 97 104*  BUN 10 10  CREATININE 0.36* 0.37*  CALCIUM 9.2 8.9    Micro Results: Recent Results (from the past 240 hour(s))  CULTURE, BLOOD (ROUTINE X 2)     Status: None   Collection Time    11/07/12  4:00 PM      Result Value Range Status   Specimen Description BLOOD LEFT HAND   Final   Special Requests BOTTLES DRAWN AEROBIC ONLY 2CC   Final   Culture  Setup Time 11/08/2012 01:12   Final   Culture NO GROWTH 5 DAYS   Final   Report Status 11/14/2012 FINAL   Final  CULTURE, BLOOD (ROUTINE X 2)     Status: None   Collection Time    11/07/12  6:15 PM      Result Value Range Status   Specimen Description BLOOD LEFT UPPER ARM PICC   Final   Special Requests BOTTLES DRAWN AEROBIC AND ANAEROBIC   Final   Culture  Setup Time 11/08/2012 01:12   Final   Culture NO GROWTH 5 DAYS   Final   Report Status 11/14/2012 FINAL   Final  URINE CULTURE     Status: None   Collection Time    11/08/12  1:55 AM      Result Value Range Status   Specimen Description URINE, SUPRAPUBIC   Final   Special Requests NONE   Final   Culture  Setup Time 11/08/2012 11:12   Final   Colony Count >=100,000 COLONIES/ML   Final   Culture     Final   Value: ENTEROBACTER CLOACAE     PSEUDOMONAS AERUGINOSA   Report Status 11/13/2012 FINAL   Final   Organism ID, Bacteria PSEUDOMONAS AERUGINOSA   Final  MRSA PCR SCREENING     Status: None   Collection Time    11/08/12  6:13 PM      Result Value Range Status   MRSA by PCR NEGATIVE  NEGATIVE Final   Comment:            The GeneXpert MRSA Assay (FDA     approved for NASAL specimens  only), is one component of a     comprehensive MRSA colonization     surveillance program. It is not      intended to diagnose MRSA     infection nor to guide or     monitor treatment for     MRSA infections.    Studies/Results: No results found.    Assessment/Plan: Derrich Gaby is a 47 y.o. 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 Kelly Splinter. He actually had grown a MDR pseudomonas from bone biopsy culture in April BUT DOES Not appear to have ever been treated with effective abx against this. 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  #1 CHronic pelvic osteomyelitis with previous isolation of MDR pseudomonas from femur in OR in April He is now sp I and D with bone for culture. Greatly appreciate Dr. Kelly Splinter taking him back for I and D  --followup bone cultures, hopefully we can isolate a different organism than his MDR pseudomonas for targetting and avoid use of colistin UNLESS we have to  #2 pseudomonas and Enterobacter in urine: not clear that these are pathogens and I am not going to cover them    LOS: 7 days   Acey Lav 11/14/2012, 2:51 PM

## 2012-11-14 NOTE — Progress Notes (Signed)
Placed pt on his CPAP. Cleaned mask . Patient tolerating well.

## 2012-11-15 DIAGNOSIS — D649 Anemia, unspecified: Secondary | ICD-10-CM

## 2012-11-15 DIAGNOSIS — G8250 Quadriplegia, unspecified: Secondary | ICD-10-CM

## 2012-11-15 NOTE — Progress Notes (Signed)
[facility] PROGRESS NOTE  Amiere Cawley RUE:[id number] DOB: 26-Dec-1965 DOA: 11/07/2012 PCP: [name], MD  Brief narrative:  47 y/o quadriplegic male with stage 4 decubitus ulcer admitted for increase foul smelling drainage from the decubitus ulcer associated with leucocytosis and diaphoresis. recently completed a course of vancomycin and invanz following I&D Of the sacral decub. Patient started on IV vanco and zosyn on 6/25 . I&D done on 6/30. Patient taken off abx. As per ID recommendation patient taken to OR  On 7/2  for bone bx and culture.   Assessment/Plan:  stage 4 Decubitus ulcer, chronic osteomyelitis right femur:  -Patient presents on 6/25 with worsening drainage from wound, diaphoresis, leukocytosis.  -Started on IV vancomycin and Zosyn empirically. Antibiotics now stopped pending bone biopsy. Also received Ceftriaxone for UTI.  Patient had I and D on 6/ 30 . cx not sent. Bone bx per IR recommendation done on 7/2. Pending results. . Had irrigation and debridement of the ulcer with placement of Acell powder and Acell sheet.   Mycrocitic Anemia:  Hb 6.4 on admission. Received 2 units of PRBC on 6/25 and 1 u on 6/29. Hb stable. Continue with iron supplement.   C5 Quadriplegic:  Continue with support Care.   Leukocytosis:  Likely secondary to decubitus ulcer worsening infection, UTI. cx so far negative. CXR normal   UTI:  urine culture from 6/26 growing pseudmonas. Received 3 days. Of rocephin abx.  now held with pending bone bx   -Hypotension:  Resolved. Resumed Norvasc.   Malnutrition:  continue Ensure.   Code Status: Full Code.  Family Communication: none at bedside  Disposition Plan: pending bone bx results.   Consultants:  Dr Kelly Splinter  Dr Zenaida Niece dam  Procedures:  none Antibiotics:  Vancomycin 6-25// 6-26  Zosyn 6-25 // 6-26  Ceftriaxone 6-28 stopped 7-1.   HPI/Subjective: No overnight issues  Objective: Filed Vitals:   11/14/12 2104 11/15/12 0215  11/15/12 0330 11/15/12 0541  BP: 126/71   132/76  Pulse: 110   103  Temp: 99.7 F (37.6 C)   99.3 F (37.4 C)  TempSrc: Oral   Oral  Resp: 16   16  Height:      Weight:      SpO2: 98% 93% 99% 98%    Intake/Output Summary (Last 24 hours) at 11/15/12 1202 Last data filed at 11/15/12 1003  Gross per 24 hour  Intake    960 ml  Output   2680 ml  Net  -1720 ml   Filed Weights   11/09/12 0600 11/10/12 0600 11/11/12 0510  Weight: 104.1 kg (229 lb 8 oz) 104 kg (229 lb 4.5 oz) 104.8 kg (231 lb 0.7 oz)    Exam:  General: middle aged obese male in NAD  HEENT: No pallor , moist mucosa  Chest: clear b/l, no added sounds  Cardiovascular: NS1&S2, no murmurs, rubs or gallop  Abdomen: soft, NT, 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  Musculoskeletal: quadriplegic ., stage IV pressure ulcers over sacrum and ischium  CNS: AAOX3   Data Reviewed: Basic Metabolic Panel:  Recent Labs Lab 11/09/12 0335 11/10/12 1140 11/11/12 0425 11/12/12 0525 11/13/12 1155  NA 136 144 142 136 136  K 3.3* 3.6 3.8 3.9 3.8  CL 103 109 107 101 101  CO2 25 27 28 28 29   GLUCOSE 102* 136* 102* 97 104*  BUN 13 10 12 10 10   CREATININE 0.46* 0.35* 0.38* 0.36* 0.37*  CALCIUM 8.7 9.0 9.0 9.2 8.9  MG  --   --  2.0  --   --    Liver Function Tests: No results found for this basename: AST, ALT, ALKPHOS, BILITOT, PROT, ALBUMIN,  in the last 168 hours No results found for this basename: LIPASE, AMYLASE,  in the last 168 hours No results found for this basename: AMMONIA,  in the last 168 hours CBC:  Recent Labs Lab 11/09/12 0335 11/10/12 1140 11/11/12 0425 11/12/12 0525 11/13/12 1155  WBC 11.1* 11.7* 9.7 13.2* 12.9*  HGB 7.5* 7.8* 7.3* 9.1* 8.5*  HCT 25.0* 25.1* 24.8* 29.6* 27.8*  MCV 79.6 80.4 80.5 80.7 81.3  PLT 541* 529* 513* 530* 497*   Cardiac Enzymes: No results found for this basename: CKTOTAL, CKMB, CKMBINDEX, TROPONINI,  in the last 168 hours BNP (last 3 results) No results  found for this basename: PROBNP,  in the last 8760 hours CBG: No results found for this basename: GLUCAP,  in the last 168 hours  Recent Results (from the past 240 hour(s))  CULTURE, BLOOD (ROUTINE X 2)     Status: None   Collection Time    11/07/12  4:00 PM      Result Value Range Status   Specimen Description BLOOD LEFT HAND   Final   Special Requests BOTTLES DRAWN AEROBIC ONLY 2CC   Final   Culture  Setup Time 11/08/2012 01:12   Final   Culture NO GROWTH 5 DAYS   Final   Report Status 11/14/2012 FINAL   Final  CULTURE, BLOOD (ROUTINE X 2)     Status: None   Collection Time    11/07/12  6:15 PM      Result Value Range Status   Specimen Description BLOOD LEFT UPPER ARM PICC   Final   Special Requests BOTTLES DRAWN AEROBIC AND ANAEROBIC   Final   Culture  Setup Time 11/08/2012 01:12   Final   Culture NO GROWTH 5 DAYS   Final   Report Status 11/14/2012 FINAL   Final  URINE CULTURE     Status: None   Collection Time    11/08/12  1:55 AM      Result Value Range Status   Specimen Description URINE, SUPRAPUBIC   Final   Special Requests NONE   Final   Culture  Setup Time 11/08/2012 11:12   Final   Colony Count >=100,000 COLONIES/ML   Final   Culture     Final   Value: ENTEROBACTER CLOACAE     PSEUDOMONAS AERUGINOSA   Report Status 11/13/2012 FINAL   Final   Organism ID, Bacteria PSEUDOMONAS AERUGINOSA   Final  MRSA PCR SCREENING     Status: None   Collection Time    11/08/12  6:13 PM      Result Value Range Status   MRSA by PCR NEGATIVE  NEGATIVE Final   Comment:            The GeneXpert MRSA Assay (FDA     approved for NASAL specimens     only), is one component of a     comprehensive MRSA colonization     surveillance program. It is not     intended to diagnose MRSA     infection nor to guide or     monitor treatment for     MRSA infections.  TISSUE CULTURE     Status: None   Collection Time    11/14/12  9:49 AM      Result Value Range Status   Specimen  Description TISSUE RT  FEMUR   Final   Special Requests     Final   Value: PATIENT ON FOLLOWING MAXIPINE LAST DOSE [id number]   Gram Stain     Final   Value: FEW WBC PRESENT, PREDOMINANTLY PMN     NO ORGANISMS SEEN   Culture Culture reincubated for better growth   Final   Report Status PENDING   Incomplete     Studies: No results found.  Scheduled Meds: . amLODipine  5 mg Oral Daily  . baclofen  20 mg Oral QID  . docusate sodium  100 mg Oral BID  . famotidine  20 mg Oral BID  . feeding supplement  237 mL Oral BID BM  . ferrous sulfate  325 mg Oral BID WC  . metoCLOPramide  10 mg Oral TID AC  . multivitamin with minerals  1 tablet Oral q morning - 10a  . nutrition supplement  1 packet Oral BID BM  . sodium chloride  10-40 mL Intracatheter Q12H  . vitamin C  500 mg Oral q morning - 10a  . zinc sulfate  220 mg Oral q morning - 10a   Continuous Infusions:     Time spent: 25 minutes    Dim Meisinger  [facility] Pager [phone] If 7PM-7AM, please contact night-coverage at [email or web address], password [facility] 11/15/2012, 12:02 PM  LOS: 8 days

## 2012-11-15 NOTE — Progress Notes (Signed)
[facility]   OFF abx at present    1  Day of cefepime  4  days of ceftriaxone 2 days zosyn 2 days vancomycin  Subjective: Successfully had I and D with bone for culture thanks to Dr. Kelly Splinter   Antibiotics:  Anti-infectives   Start     Dose/Rate Route Frequency Ordered Stop   11/14/12 1010  polymyxin B 500,000 Units, bacitracin 50,000 Units in sodium chloride irrigation 0.9 % 500 mL irrigation  Status:  Discontinued       As needed 11/14/12 1011 11/14/12 1027   11/13/12 1200  ceFEPIme (MAXIPIME) 2 g in dextrose 5 % 50 mL IVPB  Status:  Discontinued     2 g 100 mL/hr over 30 Minutes Intravenous Every 12 hours 11/13/12 1051 11/13/12 1314   11/13/12 0600  ceFAZolin (ANCEF) IVPB 2 g/50 mL premix  Status:  Discontinued     2 g 100 mL/hr over 30 Minutes Intravenous On call to O.R. 11/12/12 1538 11/13/12 1314   11/12/12 1328  polymyxin B 500,000 Units, bacitracin 50,000 Units in sodium chloride irrigation 0.9 % 500 mL irrigation  Status:  Discontinued       As needed 11/12/12 1328 11/12/12 1410   11/10/12 1400  cefTRIAXone (ROCEPHIN) 1 g in dextrose 5 % 50 mL IVPB  Status:  Discontinued     1 g 100 mL/hr over 30 Minutes Intravenous Every 24 hours 11/10/12 1221 11/13/12 1038   11/09/12 0000  ceFAZolin (ANCEF) IVPB 2 g/50 mL premix  Status:  Discontinued     2 g 100 mL/hr over 30 Minutes Intravenous 30 min pre-op 11/08/12 1344 11/12/12 1435   11/08/12 0830  ceFAZolin (ANCEF) IVPB 2 g/50 mL premix  Status:  Discontinued     2 g 100 mL/hr over 30 Minutes Intravenous On call to O.R. 11/08/12 0826 11/08/12 1344   11/08/12 0800  vancomycin (VANCOCIN) IVPB 1000 mg/200 mL premix  Status:  Discontinued     1,000 mg 200 mL/hr over 60 Minutes Intravenous Every 12 hours 11/07/12 2004 11/08/12 1600   11/08/12 0400  piperacillin-tazobactam (ZOSYN) IVPB 3.375 g  Status:  Discontinued     3.375 g 12.5 mL/hr over 240 Minutes Intravenous Every 8 hours 11/07/12 2005  11/08/12 1600   11/07/12 1700  vancomycin (VANCOCIN) IVPB 1000 mg/200 mL premix     1,000 mg 200 mL/hr over 60 Minutes Intravenous  Once 11/07/12 1603 11/07/12 2122   11/07/12 1700  piperacillin-tazobactam (ZOSYN) IVPB 3.375 g     3.375 g 100 mL/hr over 30 Minutes Intravenous  Once 11/07/12 1603 11/07/12 2053   11/07/12 1330  cefTRIAXone (ROCEPHIN) injection 1 g     1 g Intramuscular  Once 11/07/12 1320 11/07/12 1355      Medications: Scheduled Meds: . amLODipine  5 mg Oral Daily  . baclofen  20 mg Oral QID  . docusate sodium  100 mg Oral BID  . famotidine  20 mg Oral BID  . feeding supplement  237 mL Oral BID BM  . ferrous sulfate  325 mg Oral BID WC  . metoCLOPramide  10 mg Oral TID AC  . multivitamin with minerals  1 tablet Oral q morning - 10a  . nutrition supplement  1 packet Oral BID BM  . sodium chloride  10-40 mL Intracatheter Q12H  . vitamin C  500 mg Oral q morning - 10a  . zinc sulfate  220 mg Oral q morning - 10a  Continuous Infusions:   PRN Meds:.acetaminophen, albuterol, HYDROcodone-acetaminophen, morphine injection, ondansetron, sodium chloride   Objective: Weight change:   Intake/Output Summary (Last 24 hours) at 11/15/12 1753 Last data filed at 11/15/12 1704  Gross per 24 hour  Intake   1680 ml  Output   4880 ml  Net  -3200 ml   Blood pressure 151/74, pulse 105, temperature 99.9 F (37.7 C), temperature source Oral, resp. rate 16, height 5\' 7"  (1.702 m), weight 231 lb 0.7 oz (104.8 kg), SpO2 99.00%. Temp:  [99.3 F (37.4 C)-99.9 F (37.7 C)] 99.9 F (37.7 C) (07/03 1404) Pulse Rate:  [103-110] 105 (07/03 1404) Resp:  [16] 16 (07/03 1404) BP: (126-151)/(71-76) 151/74 mmHg (07/03 1404) SpO2:  [93 %-99 %] 99 % (07/03 1404)  Physical Exam: General: Alert and awake, oriented x3, not in any acute distress. HEENT: anicteric sclera, pupils reactive to light and accommodation, EOMI CVS regular rate, normal r,  no murmur rubs or gallops Wounds : not  examined today Neuro:  Paraplegic  Lab Results:  Recent Labs  11/13/12 1155  WBC 12.9*  HGB 8.5*  HCT 27.8*  PLT 497*    BMET  Recent Labs  11/13/12 1155  NA 136  K 3.8  CL 101  CO2 29  GLUCOSE 104*  BUN 10  CREATININE 0.37*  CALCIUM 8.9    Micro Results: Recent Results (from the past 240 hour(s))  CULTURE, BLOOD (ROUTINE X 2)     Status: None   Collection Time    11/07/12  4:00 PM      Result Value Range Status   Specimen Description BLOOD LEFT HAND   Final   Special Requests BOTTLES DRAWN AEROBIC ONLY 2CC   Final   Culture  Setup Time 11/08/2012 01:12   Final   Culture NO GROWTH 5 DAYS   Final   Report Status 11/14/2012 FINAL   Final  CULTURE, BLOOD (ROUTINE X 2)     Status: None   Collection Time    11/07/12  6:15 PM      Result Value Range Status   Specimen Description BLOOD LEFT UPPER ARM PICC   Final   Special Requests BOTTLES DRAWN AEROBIC AND ANAEROBIC   Final   Culture  Setup Time 11/08/2012 01:12   Final   Culture NO GROWTH 5 DAYS   Final   Report Status 11/14/2012 FINAL   Final  URINE CULTURE     Status: None   Collection Time    11/08/12  1:55 AM      Result Value Range Status   Specimen Description URINE, SUPRAPUBIC   Final   Special Requests NONE   Final   Culture  Setup Time 11/08/2012 11:12   Final   Colony Count >=100,000 COLONIES/ML   Final   Culture     Final   Value: ENTEROBACTER CLOACAE     PSEUDOMONAS AERUGINOSA   Report Status 11/13/2012 FINAL   Final   Organism ID, Bacteria PSEUDOMONAS AERUGINOSA   Final  MRSA PCR SCREENING     Status: None   Collection Time    11/08/12  6:13 PM      Result Value Range Status   MRSA by PCR NEGATIVE  NEGATIVE Final   Comment:            The GeneXpert MRSA Assay (FDA     approved for NASAL specimens     only), is one component of a     comprehensive MRSA colonization  surveillance program. It is not     intended to diagnose MRSA     infection nor to guide or     monitor treatment  for     MRSA infections.  TISSUE CULTURE     Status: None   Collection Time    11/14/12  9:49 AM      Result Value Range Status   Specimen Description TISSUE RT FEMUR   Final   Special Requests     Final   Value: PATIENT ON FOLLOWING MAXIPINE LAST DOSE [id number]   Gram Stain     Final   Value: FEW WBC PRESENT, PREDOMINANTLY PMN     NO ORGANISMS SEEN   Culture Culture reincubated for better growth   Final   Report Status PENDING   Incomplete  FUNGUS CULTURE W SMEAR     Status: None   Collection Time    11/14/12  9:49 AM      Result Value Range Status   Specimen Description TISSUE RIGHT FEMUR   Final   Special Requests     Final   Value: PATIENT ON FOLLOWING MAXIPINE LAST DOSE [id number]   Fungal Smear NO YEAST OR FUNGAL ELEMENTS SEEN   Final   Culture CULTURE IN PROGRESS FOR FOUR WEEKS   Final   Report Status PENDING   Incomplete    Studies/Results: No results found.    Assessment/Plan: [name] [name] is a 47 y.o. 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 Kelly Splinter. He actually had grown a MDR pseudomonas from bone biopsy culture in April BUT DOES Not appear to have ever been treated with effective abx against this. 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  #1 CHronic pelvic osteomyelitis with previous isolation of MDR pseudomonas from femur in OR in April He is now sp I and D with bone for culture. Greatly appreciate Dr. Kelly Splinter taking him back for I and D  --followup bone cultures, hopefully we can isolate a different organism than his MDR pseudomonas for targetting and avoid use of colistin UNLESS we have to  #2 pseudomonas and Enterobacter in urine: not clear that these are pathogens and I am not going to cover them    LOS: 8 days   Acey Lav 11/15/2012, 5:53 PM

## 2012-11-15 NOTE — Progress Notes (Signed)
Discussed in long length of stay rounds. 

## 2012-11-16 DIAGNOSIS — G4733 Obstructive sleep apnea (adult) (pediatric): Secondary | ICD-10-CM

## 2012-11-16 LAB — CBC
HCT: 26.7 % — ABNORMAL LOW (ref 39.0–52.0)
MCV: 81.2 fL (ref 78.0–100.0)
RBC: 3.29 MIL/uL — ABNORMAL LOW (ref 4.22–5.81)
WBC: 12.1 10*3/uL — ABNORMAL HIGH (ref 4.0–10.5)

## 2012-11-16 MED ORDER — ENOXAPARIN SODIUM 40 MG/0.4ML ~~LOC~~ SOLN
40.0000 mg | SUBCUTANEOUS | Status: DC
  Administered 2012-11-16 – 2012-11-20 (×5): 40 mg via SUBCUTANEOUS
  Filled 2012-11-16 (×5): qty 0.4

## 2012-11-16 NOTE — Progress Notes (Addendum)
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 recently completed a course of vancomycin and invanz following I&D Of the sacral decub. Patient started on IV vanco and zosyn on 6/25 . I&D done on 6/30. Patient taken off abx. As per ID recommendation patient taken to OR On 7/2 for bone bx and culture.   Assessment/Plan:  stage 4 Decubitus ulcer, chronic osteomyelitis right femur:  -Patient presented on 6/25 with worsening drainage from wound, diaphoresis, leukocytosis.  -Started on IV vancomycin and Zosyn empirically. Antibiotics now stopped pending bone biopsy. Also received Ceftriaxone for UTI. Wound cx previously grew pseudomonas. Patient had I and D on 6/ 30 . cx not sent. Bone bx per ID  recommendation done on 7/2.  Preliminary cx growing rare GNR. Had irrigation and debridement of the ulcer with placement of Acell powder and Acell sheet.  -has low grade temp ( 99.27F)  Mycrocitic Anemia:  Hb 6.4 on admission. Received 2 units of PRBC on 6/25 and 1 u on 6/29. Hb stable. Continue with iron supplement.   C5 Quadriplegic:  Continue with support Care.   Leukocytosis:  Likely secondary to decubitus ulcer worsening infection, UTI. cx so far negative. CXR normal   UTI:  urine culture from 6/26 growing pseudmonas. Received 3 days. Of rocephin abx. now held with pending bone bx   -Hypotension:  Resolved. Resumed Norvasc.   Malnutrition:  continue Ensure.   OSA  on CPAP at bedtime  Code Status: Full Code.  Family Communication: none at bedside  Disposition Plan: pending bone bx results. D/c to SNF likely on monday   Consultants:  Dr Kelly Splinter  Dr Zenaida Niece dam   Procedures:  None  Antibiotics:  Vancomycin 6-25// 6-26  Zosyn 6-25  // 6-26  Ceftriaxone 6-28 stopped 7-1.   HPI/Subjective: No overnight issues. Has low grade temp  Objective: Filed Vitals:   11/15/12 2141 11/16/12 0006 11/16/12 0300 11/16/12 0641  BP: 136/68   147/93  Pulse: 110 119  98  Temp: 97.8 F (36.6 C)   99.5 F (37.5 C)  TempSrc: Oral   Oral  Resp: 16 20  16   Height:      Weight:      SpO2: 100% 100% 100% 97%    Intake/Output Summary (Last 24 hours) at 11/16/12 0928 Last data filed at 11/16/12 0641  Gross per 24 hour  Intake   1440 ml  Output   6585 ml  Net  -5145 ml   Filed Weights   11/09/12 0600 11/10/12 0600 11/11/12 0510  Weight: 104.1 kg (229 lb 8 oz) 104 kg (229 lb 4.5 oz) 104.8 kg (231 lb 0.7 oz)    Exam:  General: middle aged obese male in NAD  HEENT: No pallor , moist mucosa  Chest: clear b/l, no added sounds  Cardiovascular: NS1&S2, no murmurs, rubs or gallop  Abdomen: soft, NT, 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  Musculoskeletal: quadriplegic ., stage IV pressure ulcers over sacrum and ischium  CNS: AAOX3  Data Reviewed: Basic Metabolic Panel:  Recent Labs Lab 11/10/12 1140 11/11/12 0425 11/12/12 0525 11/13/12 1155  NA 144 142 136 136  K 3.6 3.8 3.9 3.8  CL 109 107 101 101  CO2 27 28 28 29   GLUCOSE 136* 102* 97 104*  BUN  10 12 10 10   CREATININE 0.35* 0.38* 0.36* 0.37*  CALCIUM 9.0 9.0 9.2 8.9  MG  --  2.0  --   --    Liver Function Tests: No results found for this basename: AST, ALT, ALKPHOS, BILITOT, PROT, ALBUMIN,  in the last 168 hours No results found for this basename: LIPASE, AMYLASE,  in the last 168 hours No results found for this basename: AMMONIA,  in the last 168 hours CBC:  Recent Labs Lab 11/10/12 1140 11/11/12 0425 11/12/12 0525 11/13/12 1155 11/16/12 0530  WBC 11.7* 9.7 13.2* 12.9* 12.1*  HGB 7.8* 7.3* 9.1* 8.5* 8.1*  HCT 25.1* 24.8* 29.6* 27.8* 26.7*  MCV 80.4 80.5 80.7 81.3 81.2  PLT 529* 513* 530* 497* 475*   Cardiac Enzymes: No results found for  this basename: CKTOTAL, CKMB, CKMBINDEX, TROPONINI,  in the last 168 hours BNP (last 3 results) No results found for this basename: PROBNP,  in the last 8760 hours CBG: No results found for this basename: GLUCAP,  in the last 168 hours  Recent Results (from the past 240 hour(s))  CULTURE, BLOOD (ROUTINE X 2)     Status: None   Collection Time    11/07/12  4:00 PM      Result Value Range Status   Specimen Description BLOOD LEFT HAND   Final   Special Requests BOTTLES DRAWN AEROBIC ONLY 2CC   Final   Culture  Setup Time 11/08/2012 01:12   Final   Culture NO GROWTH 5 DAYS   Final   Report Status 11/14/2012 FINAL   Final  CULTURE, BLOOD (ROUTINE X 2)     Status: None   Collection Time    11/07/12  6:15 PM      Result Value Range Status   Specimen Description BLOOD LEFT UPPER ARM PICC   Final   Special Requests BOTTLES DRAWN AEROBIC AND ANAEROBIC   Final   Culture  Setup Time 11/08/2012 01:12   Final   Culture NO GROWTH 5 DAYS   Final   Report Status 11/14/2012 FINAL   Final  URINE CULTURE     Status: None   Collection Time    11/08/12  1:55 AM      Result Value Range Status   Specimen Description URINE, SUPRAPUBIC   Final   Special Requests NONE   Final   Culture  Setup Time 11/08/2012 11:12   Final   Colony Count >=100,000 COLONIES/ML   Final   Culture     Final   Value: ENTEROBACTER CLOACAE     PSEUDOMONAS AERUGINOSA   Report Status 11/13/2012 FINAL   Final   Organism ID, Bacteria PSEUDOMONAS AERUGINOSA   Final  MRSA PCR SCREENING     Status: None   Collection Time    11/08/12  6:13 PM      Result Value Range Status   MRSA by PCR NEGATIVE  NEGATIVE Final   Comment:            The GeneXpert MRSA Assay (FDA     approved for NASAL specimens     only), is one component of a     comprehensive MRSA colonization     surveillance program. It is not     intended to diagnose MRSA     infection nor to guide or     monitor treatment for     MRSA infections.  TISSUE CULTURE      Status: None   Collection Time  11/14/12  9:49 AM      Result Value Range Status   Specimen Description TISSUE RT FEMUR   Final   Special Requests     Final   Value: PATIENT ON FOLLOWING MAXIPINE LAST DOSE [id number]   Gram Stain     Final   Value: FEW WBC PRESENT, PREDOMINANTLY PMN     NO ORGANISMS SEEN   Culture RARE GRAM NEGATIVE RODS   Final   Report Status PENDING   Incomplete  FUNGUS CULTURE W SMEAR     Status: None   Collection Time    11/14/12  9:49 AM      Result Value Range Status   Specimen Description TISSUE RIGHT FEMUR   Final   Special Requests     Final   Value: PATIENT ON FOLLOWING MAXIPINE LAST DOSE [id number] 1213   Fungal Smear NO YEAST OR FUNGAL ELEMENTS SEEN   Final   Culture CULTURE IN PROGRESS FOR FOUR WEEKS   Final   Report Status PENDING   Incomplete     Studies: No results found.  Scheduled Meds: . amLODipine  5 mg Oral Daily  . baclofen  20 mg Oral QID  . docusate sodium  100 mg Oral BID  . famotidine  20 mg Oral BID  . feeding supplement  237 mL Oral BID BM  . ferrous sulfate  325 mg Oral BID WC  . metoCLOPramide  10 mg Oral TID AC  . multivitamin with minerals  1 tablet Oral q morning - 10a  . nutrition supplement  1 packet Oral BID BM  . sodium chloride  10-40 mL Intracatheter Q12H  . vitamin C  500 mg Oral q morning - 10a  . zinc sulfate  220 mg Oral q morning - 10a   Continuous Infusions:     Time spent: 25 minutes    Joli Koob  [facility] Pager [phone] If 7PM-7AM, please contact night-coverage at [email or web address], password [facility] 11/16/2012, 9:28 AM  LOS: 9 days

## 2012-11-16 NOTE — Progress Notes (Signed)
CSW assisting with d/c planning. CSW met with pt to see if he has chosen SNF. Pt states his caregiver is touring facilities and will most likely have a decision this week end. Weekend CSW will check back with pt. to continue assisting with d/c planning.  Cori Razor LCSW [phone]

## 2012-11-17 NOTE — Progress Notes (Signed)
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 recently completed a course of vancomycin and invanz following I&D Of the sacral decub. Patient started on IV vanco and zosyn on 6/25 . I&D done on 6/30. Patient taken off abx. As per ID recommendation patient taken to OR On 7/2 for bone bx and culture.   Assessment/Plan:  stage 4 Decubitus ulcer, chronic osteomyelitis right femur:  -Patient presented on 6/25 with worsening drainage from wound, diaphoresis, leukocytosis.  -Started on IV vancomycin and Zosyn empirically. Antibiotics now stopped pending bone biopsy. Also received Ceftriaxone for UTI. Wound cx previously grew pseudomonas.  Patient had I and D on 6/ 30 . cx not sent. Bone bx per ID recommendation done on 7/2. Preliminary cx growing rare GNR. Pending final cx.  Had irrigation and debridement of the ulcer with placement of Acell powder and Acell sheet.    Mycrocitic Anemia:  Hb 6.4 on admission. Received 2 units of PRBC on 6/25 and 1 u on 6/29. Hb stable. Continue with iron supplement.   C5 Quadriplegic:  Continue with support Care.   Leukocytosis:  Likely secondary to decubitus ulcer worsening infection, UTI. cx so far negative. CXR normal   UTI:  urine culture from 6/26 growing pseudmonas. Received 3 days. Of rocephin abx. now held with pending bone bx   -Hypotension:  Resolved. Resumed Norvasc.   Malnutrition:  continue Ensure.   OSA  on CPAP at bedtime   Code Status: Full Code.  Family Communication: none at bedside  Disposition Plan: pending bone bx results. D/c to SNF likely on monday   Consultants:  Dr Kelly Splinter  Dr Zenaida Niece dam   Procedures:  None  Antibiotics:  Vancomycin 6-25// 6-26  Zosyn 6-25 // 6-26   Ceftriaxone 6-28 stopped 7-1.   HPI/Subjective:  No overnight issues.    Objective: Filed Vitals:   11/16/12 1300 11/16/12 2118 11/17/12 0009 11/17/12 0500  BP: 161/83 132/80  133/79  Pulse: 101 113 109 102  Temp: 98.9 F (37.2 C) 99.1 F (37.3 C)  99.2 F (37.3 C)  TempSrc: Oral Oral  Axillary  Resp: 16 16 16 16   Height:      Weight:      SpO2: 98% 100% 100% 99%    Intake/Output Summary (Last 24 hours) at 11/17/12 0923 Last data filed at 11/17/12 0848  Gross per 24 hour  Intake   1210 ml  Output   4601 ml  Net  -3391 ml   Filed Weights   11/09/12 0600 11/10/12 0600 11/11/12 0510  Weight: 104.1 kg (229 lb 8 oz) 104 kg (229 lb 4.5 oz) 104.8 kg (231 lb 0.7 oz)    Exam:  General: middle aged obese male in NAD  HEENT: No pallor , moist mucosa  Chest: clear b/l, no added sounds  Cardiovascular: NS1&S2, no murmurs, rubs or gallop Abdomen: soft, NT, 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  Musculoskeletal: quadriplegic, stage IV pressure ulcers over sacrum and ischium  CNS: AAOX3  Data Reviewed: Basic Metabolic Panel:  Recent Labs Lab 11/10/12 1140 11/11/12 0425 11/12/12 0525 11/13/12 1155  NA 144 142 136 136  K 3.6 3.8 3.9 3.8  CL 109 107 101 101  CO2 27 28 28 29   GLUCOSE 136* 102* 97 104*  BUN 10  12 10 10   CREATININE 0.35* 0.38* 0.36* 0.37*  CALCIUM 9.0 9.0 9.2 8.9  MG  --  2.0  --   --    Liver Function Tests: No results found for this basename: AST, ALT, ALKPHOS, BILITOT, PROT, ALBUMIN,  in the last 168 hours No results found for this basename: LIPASE, AMYLASE,  in the last 168 hours No results found for this basename: AMMONIA,  in the last 168 hours CBC:  Recent Labs Lab 11/10/12 1140 11/11/12 0425 11/12/12 0525 11/13/12 1155 11/16/12 0530  WBC 11.7* 9.7 13.2* 12.9* 12.1*  HGB 7.8* 7.3* 9.1* 8.5* 8.1*  HCT 25.1* 24.8* 29.6* 27.8* 26.7*  MCV 80.4 80.5 80.7 81.3 81.2  PLT 529* 513* 530* 497* 475*   Cardiac Enzymes: No results  found for this basename: CKTOTAL, CKMB, CKMBINDEX, TROPONINI,  in the last 168 hours BNP (last 3 results) No results found for this basename: PROBNP,  in the last 8760 hours CBG: No results found for this basename: GLUCAP,  in the last 168 hours  Recent Results (from the past 240 hour(s))  CULTURE, BLOOD (ROUTINE X 2)     Status: None   Collection Time    11/07/12  4:00 PM      Result Value Range Status   Specimen Description BLOOD LEFT HAND   Final   Special Requests BOTTLES DRAWN AEROBIC ONLY 2CC   Final   Culture  Setup Time 11/08/2012 01:12   Final   Culture NO GROWTH 5 DAYS   Final   Report Status 11/14/2012 FINAL   Final  CULTURE, BLOOD (ROUTINE X 2)     Status: None   Collection Time    11/07/12  6:15 PM      Result Value Range Status   Specimen Description BLOOD LEFT UPPER ARM PICC   Final   Special Requests BOTTLES DRAWN AEROBIC AND ANAEROBIC   Final   Culture  Setup Time 11/08/2012 01:12   Final   Culture NO GROWTH 5 DAYS   Final   Report Status 11/14/2012 FINAL   Final  URINE CULTURE     Status: None   Collection Time    11/08/12  1:55 AM      Result Value Range Status   Specimen Description URINE, SUPRAPUBIC   Final   Special Requests NONE   Final   Culture  Setup Time 11/08/2012 11:12   Final   Colony Count >=100,000 COLONIES/ML   Final   Culture     Final   Value: ENTEROBACTER CLOACAE     PSEUDOMONAS AERUGINOSA   Report Status 11/13/2012 FINAL   Final   Organism ID, Bacteria PSEUDOMONAS AERUGINOSA   Final  MRSA PCR SCREENING     Status: None   Collection Time    11/08/12  6:13 PM      Result Value Range Status   MRSA by PCR NEGATIVE  NEGATIVE Final   Comment:            The GeneXpert MRSA Assay (FDA     approved for NASAL specimens     only), is one component of a     comprehensive MRSA colonization     surveillance program. It is not     intended to diagnose MRSA     infection nor to guide or     monitor treatment for     MRSA infections.   TISSUE CULTURE     Status: None   Collection Time  11/14/12  9:49 AM      Result Value Range Status   Specimen Description TISSUE RT FEMUR   Final   Special Requests     Final   Value: PATIENT ON FOLLOWING MAXIPINE LAST DOSE [id number]   Gram Stain     Final   Value: FEW WBC PRESENT, PREDOMINANTLY PMN     NO ORGANISMS SEEN   Culture RARE GRAM NEGATIVE RODS   Final   Report Status PENDING   Incomplete  FUNGUS CULTURE W SMEAR     Status: None   Collection Time    11/14/12  9:49 AM      Result Value Range Status   Specimen Description TISSUE RIGHT FEMUR   Final   Special Requests     Final   Value: PATIENT ON FOLLOWING MAXIPINE LAST DOSE [id number]   Fungal Smear NO YEAST OR FUNGAL ELEMENTS SEEN   Final   Culture CULTURE IN PROGRESS FOR FOUR WEEKS   Final   Report Status PENDING   Incomplete     Studies: No results found.  Scheduled Meds: . amLODipine  5 mg Oral Daily  . baclofen  20 mg Oral QID  . docusate sodium  100 mg Oral BID  . enoxaparin (LOVENOX) injection  40 mg Subcutaneous Q24H  . famotidine  20 mg Oral BID  . feeding supplement  237 mL Oral BID BM  . ferrous sulfate  325 mg Oral BID WC  . metoCLOPramide  10 mg Oral TID AC  . multivitamin with minerals  1 tablet Oral q morning - 10a  . nutrition supplement  1 packet Oral BID BM  . sodium chloride  10-40 mL Intracatheter Q12H  . vitamin C  500 mg Oral q morning - 10a  . zinc sulfate  220 mg Oral q morning - 10a   Continuous Infusions:     Time spent: 25 minutes    [name] [name]  [facility] Pager [phone] If 7PM-7AM, please contact night-coverage at [email or web address], password  [facility] 11/17/2012, 9:23 AM  LOS: 10 days

## 2012-11-17 NOTE — Progress Notes (Signed)
CSW met with pt at bedside re: decision for SNF placement.   Pt states his caregiver is touring facilities today and will be at hospital later this evening to discuss with pt about tours.  Pt agreeable to Weekend CSW following up tomorrow to see if decision has been made re: SNF.  CSW discussed with pt that per MD note, pt likely ready for d/c on Monday.   CSW to continue to follow.  Jacklynn Lewis, MSW, [employer]  Clinical Social Work Weekend coverage [phone]

## 2012-11-18 DIAGNOSIS — B952 Enterococcus as the cause of diseases classified elsewhere: Secondary | ICD-10-CM

## 2012-11-18 DIAGNOSIS — Z8619 Personal history of other infectious and parasitic diseases: Secondary | ICD-10-CM

## 2012-11-18 LAB — TISSUE CULTURE

## 2012-11-18 MED ORDER — SODIUM CHLORIDE 0.9 % IV SOLN
1.0000 g | INTRAVENOUS | Status: DC
  Administered 2012-11-18 – 2012-11-20 (×3): 1 g via INTRAVENOUS
  Filled 2012-11-18 (×3): qty 1

## 2012-11-18 NOTE — Progress Notes (Addendum)
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 recently completed a course of vancomycin and invanz following I&D Of the sacral decub. Patient started on IV vanco and zosyn on 6/25 . I&D done on 6/30. Patient taken off abx. As per ID recommendation patient taken to OR On 7/2 for bone bx and culture.   Assessment/Plan:  stage 4 Decubitus ulcer, chronic osteomyelitis right femur:  -Patient presented on 6/25 with worsening drainage from wound, diaphoresis, leukocytosis.  -Started on IV vancomycin and Zosyn empirically. Antibiotics  stopped pending bone biopsy. Also received Ceftriaxone for UTI. Wound cx previously grew pseudomonas.  Patient had I and D on 6/ 30 . cx not sent. Bone bx per ID recommendation done on 7/2. Preliminary cx growing rare GNR. Pending final cx still pending. Had irrigation and debridement of the ulcer with placement of Acell powder and Acell sheet. -started on ertapenem by ID today. Will verify duration of treatment Wound care consulted who will see pt tomorrow.. Will order dressing with saline gauze.   Mycrocitic Anemia:  Hb 6.4 on admission. Received 2 units of PRBC on 6/25 and 1 u on 6/29. Hb stable. Continue with iron supplement.   C5 Quadriplegic:  Continue with support Care.  Will order to change suprapubic catheter  Leukocytosis:  Likely secondary to decubitus ulcer worsening infection, UTI. cx so far negative. CXR normal   UTI:  urine culture from 6/26 growing pseudmonas. Received 3 days. Of rocephin abx. now held with pending bone bx   -Hypotension:  Resolved. Resumed Norvasc.   Malnutrition:  continue Ensure.   OSA  on CPAP at bedtime   Code Status: Full Code.  Family Communication: none at  bedside  Disposition Plan: pending bone bx results. D/c to SNF likely on monday   Consultants:  Dr Kelly Splinter  Dr Zenaida Niece dam    Procedures:  None   Antibiotics:  Vancomycin 6-25// 6-26  Zosyn 6-25 // 6-26  Ceftriaxone 6-28 stopped 7-1.  IV ertapenem ( 7/6>>)   HPI/Subjective:  No overnight issues.    Objective: Filed Vitals:   11/17/12 1444 11/17/12 2300 11/18/12 0500 11/18/12 0520  BP: 102/69 127/64 156/83 145/68  Pulse: 113 106 103   Temp: 99.9 F (37.7 C) 99.8 F (37.7 C) 99.6 F (37.6 C)   TempSrc: Oral Oral Oral   Resp: 16 16 16    Height:      Weight:      SpO2: 99% 98% 100%     Intake/Output Summary (Last 24 hours) at 11/18/12 1149 Last data filed at 11/18/12 0900  Gross per 24 hour  Intake   1460 ml  Output   2601 ml  Net  -1141 ml   Filed Weights   11/09/12 0600 11/10/12 0600 11/11/12 0510  Weight: 104.1 kg (229 lb 8 oz) 104 kg (229 lb 4.5 oz) 104.8 kg (231 lb 0.7 oz)    Exam:  General: middle aged obese male in NAD  HEENT: No pallor , moist mucosa  Chest: clear b/l, no added sounds  Cardiovascular: NS1&S2, no murmurs, rubs or gallop Abdomen: soft, NT, 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  Musculoskeletal: quadriplegic, stage IV pressure ulcers over sacrum and ischium , A cell sheet over the ulcer CNS: AAOX3   Data Reviewed: Basic Metabolic Panel:  Recent Labs Lab 11/12/12 0525 11/13/12 1155  NA 136 136  K 3.9 3.8  CL 101 101  CO2 28 29  GLUCOSE 97 104*  BUN 10 10  CREATININE 0.36* 0.37*  CALCIUM 9.2 8.9   Liver Function Tests: No results found for this basename: AST, ALT, ALKPHOS, BILITOT, PROT, ALBUMIN,  in the last 168 hours No results found for this basename: LIPASE, AMYLASE,  in the last 168 hours No results found for this basename: AMMONIA,  in the last 168 hours CBC:  Recent Labs Lab 11/12/12 0525 11/13/12 1155 11/16/12 0530  WBC 13.2* 12.9* 12.1*  HGB 9.1* 8.5* 8.1*  HCT 29.6* 27.8* 26.7*  MCV 80.7 81.3  81.2  PLT 530* 497* 475*   Cardiac Enzymes: No results found for this basename: CKTOTAL, CKMB, CKMBINDEX, TROPONINI,  in the last 168 hours BNP (last 3 results) No results found for this basename: PROBNP,  in the last 8760 hours CBG: No results found for this basename: GLUCAP,  in the last 168 hours  Recent Results (from the past 240 hour(s))  MRSA PCR SCREENING     Status: None   Collection Time    11/08/12  6:13 PM      Result Value Range Status   MRSA by PCR NEGATIVE  NEGATIVE Final   Comment:            The GeneXpert MRSA Assay (FDA     approved for NASAL specimens     only), is one component of a     comprehensive MRSA colonization     surveillance program. It is not     intended to diagnose MRSA     infection nor to guide or     monitor treatment for     MRSA infections.  TISSUE CULTURE     Status: None   Collection Time    11/14/12  9:49 AM      Result Value Range Status   Specimen Description TISSUE RT FEMUR   Final   Special Requests     Final   Value: PATIENT ON FOLLOWING MAXIPINE LAST DOSE [id number]   Gram Stain     Final   Value: FEW WBC PRESENT, PREDOMINANTLY PMN     NO ORGANISMS SEEN   Culture RARE GRAM NEGATIVE RODS   Final   Report Status PENDING   Incomplete  FUNGUS CULTURE W SMEAR     Status: None   Collection Time    11/14/12  9:49 AM      Result Value Range Status   Specimen Description TISSUE RIGHT FEMUR   Final   Special Requests     Final   Value: PATIENT ON FOLLOWING MAXIPINE LAST DOSE [id number]   Fungal Smear NO YEAST OR FUNGAL ELEMENTS SEEN   Final   Culture CULTURE IN PROGRESS FOR FOUR WEEKS   Final   Report Status PENDING   Incomplete     Studies: No results found.  Scheduled Meds: . amLODipine  5 mg Oral Daily  . baclofen  20 mg Oral QID  . docusate sodium  100 mg Oral BID  . enoxaparin (LOVENOX) injection  40 mg Subcutaneous Q24H  . ertapenem  1 g Intravenous Q24H  . famotidine  20 mg Oral BID  . feeding supplement  237 mL  Oral BID BM  . ferrous sulfate  325 mg Oral BID WC  . metoCLOPramide  10 mg Oral TID AC  . multivitamin with minerals  1  tablet Oral q morning - 10a  . nutrition supplement  1 packet Oral BID BM  . sodium chloride  10-40 mL Intracatheter Q12H  . vitamin C  500 mg Oral q morning - 10a  . zinc sulfate  220 mg Oral q morning - 10a   Continuous Infusions:     Time spent: 25 minutes   Johnston Maddocks  [facility] Pager [phone]. If 7PM-7AM, please contact night-coverage at [email or web address], password [facility] 11/18/2012, 11:49 AM  LOS: 11 days

## 2012-11-18 NOTE — Progress Notes (Signed)
RN noticed that Suprapubic catheter needed changing. Placement date was 10/15/12. Called Urology floor, but no RN's are checked off in this skill. Neither is the [employer]. Will pass on to day shift and make sure they are aware of the catheter's original placement date. Triva Hueber Lynder Parents, RN 11:46 PM 11/18/2012

## 2012-11-18 NOTE — Progress Notes (Signed)
Pt stated that he's leaning toward [facility].  Pt reported that his caregiver toured and was happy with that facility.  Weekday CSW to follow.  Providence Crosby, LCSWA Clinical Social Work [phone]

## 2012-11-18 NOTE — Progress Notes (Signed)
[facility]   OFF abx at present    1  Day of cefepime  4  days of ceftriaxone 2 days zosyn 2 days vancomycin  Subjective: Successfully had I and D with bone for culture thanks to Dr. Kelly Splinter   Antibiotics:  Anti-infectives   Start     Dose/Rate Route Frequency Ordered Stop   11/18/12 1000  ertapenem (INVANZ) 1 g in sodium chloride 0.9 % 50 mL IVPB     1 g 100 mL/hr over 30 Minutes Intravenous Every 24 hours 11/18/12 0936     11/14/12 1010  polymyxin B 500,000 Units, bacitracin 50,000 Units in sodium chloride irrigation 0.9 % 500 mL irrigation  Status:  Discontinued       As needed 11/14/12 1011 11/14/12 1027   11/13/12 1200  ceFEPIme (MAXIPIME) 2 g in dextrose 5 % 50 mL IVPB  Status:  Discontinued     2 g 100 mL/hr over 30 Minutes Intravenous Every 12 hours 11/13/12 1051 11/13/12 1314   11/13/12 0600  ceFAZolin (ANCEF) IVPB 2 g/50 mL premix  Status:  Discontinued     2 g 100 mL/hr over 30 Minutes Intravenous On call to O.R. 11/12/12 1538 11/13/12 1314   11/12/12 1328  polymyxin B 500,000 Units, bacitracin 50,000 Units in sodium chloride irrigation 0.9 % 500 mL irrigation  Status:  Discontinued       As needed 11/12/12 1328 11/12/12 1410   11/10/12 1400  cefTRIAXone (ROCEPHIN) 1 g in dextrose 5 % 50 mL IVPB  Status:  Discontinued     1 g 100 mL/hr over 30 Minutes Intravenous Every 24 hours 11/10/12 1221 11/13/12 1038   11/09/12 0000  ceFAZolin (ANCEF) IVPB 2 g/50 mL premix  Status:  Discontinued     2 g 100 mL/hr over 30 Minutes Intravenous 30 min pre-op 11/08/12 1344 11/12/12 1435   11/08/12 0830  ceFAZolin (ANCEF) IVPB 2 g/50 mL premix  Status:  Discontinued     2 g 100 mL/hr over 30 Minutes Intravenous On call to O.R. 11/08/12 0826 11/08/12 1344   11/08/12 0800  vancomycin (VANCOCIN) IVPB 1000 mg/200 mL premix  Status:  Discontinued     1,000 mg 200 mL/hr over 60 Minutes Intravenous Every 12 hours 11/07/12 2004 11/08/12 1600   11/08/12 0400   piperacillin-tazobactam (ZOSYN) IVPB 3.375 g  Status:  Discontinued     3.375 g 12.5 mL/hr over 240 Minutes Intravenous Every 8 hours 11/07/12 2005 11/08/12 1600   11/07/12 1700  vancomycin (VANCOCIN) IVPB 1000 mg/200 mL premix     1,000 mg 200 mL/hr over 60 Minutes Intravenous  Once 11/07/12 1603 11/07/12 2122   11/07/12 1700  piperacillin-tazobactam (ZOSYN) IVPB 3.375 g     3.375 g 100 mL/hr over 30 Minutes Intravenous  Once 11/07/12 1603 11/07/12 2053   11/07/12 1330  cefTRIAXone (ROCEPHIN) injection 1 g     1 g Intramuscular  Once 11/07/12 1320 11/07/12 1355      Medications: Scheduled Meds: . amLODipine  5 mg Oral Daily  . baclofen  20 mg Oral QID  . docusate sodium  100 mg Oral BID  . enoxaparin (LOVENOX) injection  40 mg Subcutaneous Q24H  . ertapenem  1 g Intravenous Q24H  . famotidine  20 mg Oral BID  . feeding supplement  237 mL Oral BID BM  . ferrous sulfate  325 mg Oral BID WC  . metoCLOPramide  10 mg Oral TID AC  . multivitamin  with minerals  1 tablet Oral q morning - 10a  . nutrition supplement  1 packet Oral BID BM  . sodium chloride  10-40 mL Intracatheter Q12H  . vitamin C  500 mg Oral q morning - 10a  . zinc sulfate  220 mg Oral q morning - 10a   Continuous Infusions:   PRN Meds:.acetaminophen, albuterol, HYDROcodone-acetaminophen, morphine injection, ondansetron, sodium chloride   Objective: Weight change:   Intake/Output Summary (Last 24 hours) at 11/18/12 1313 Last data filed at 11/18/12 1244  Gross per 24 hour  Intake   1460 ml  Output   2502 ml  Net  -1042 ml   Blood pressure 145/68, pulse 103, temperature 99.6 F (37.6 C), temperature source Oral, resp. rate 16, height 5\' 7"  (1.702 m), weight 231 lb 0.7 oz (104.8 kg), SpO2 100.00%. Temp:  [99.6 F (37.6 C)-99.9 F (37.7 C)] 99.6 F (37.6 C) (07/06 0500) Pulse Rate:  [103-113] 103 (07/06 0500) Resp:  [16] 16 (07/06 0500) BP: (102-156)/(64-83) 145/68 mmHg (07/06 0520) SpO2:  [98 %-100 %]  100 % (07/06 0500)  Physical Exam: General: Alert and awake, oriented x3, not in any acute distress. HEENT: anicteric sclera, pupils reactive to light and accommodation, EOMI CVS regular rate, normal r,  no murmur rubs or gallops Wounds : not examined today Neuro:  Paraplegic  Lab Results:  Recent Labs  11/16/12 0530  WBC 12.1*  HGB 8.1*  HCT 26.7*  PLT 475*    BMET No results found for this basename: NA, K, CL, CO2, GLUCOSE, BUN, CREATININE, CALCIUM,  in the last 72 hours  Micro Results: Recent Results (from the past 240 hour(s))  MRSA PCR SCREENING     Status: None   Collection Time    11/08/12  6:13 PM      Result Value Range Status   MRSA by PCR NEGATIVE  NEGATIVE Final   Comment:            The GeneXpert MRSA Assay (FDA     approved for NASAL specimens     only), is one component of a     comprehensive MRSA colonization     surveillance program. It is not     intended to diagnose MRSA     infection nor to guide or     monitor treatment for     MRSA infections.  TISSUE CULTURE     Status: None   Collection Time    11/14/12  9:49 AM      Result Value Range Status   Specimen Description TISSUE RT FEMUR   Final   Special Requests     Final   Value: PATIENT ON FOLLOWING MAXIPINE LAST DOSE [id number]   Gram Stain     Final   Value: FEW WBC PRESENT, PREDOMINANTLY PMN     NO ORGANISMS SEEN   Culture RARE ENTEROBACTER CLOACAE   Final   Report Status 11/18/2012 FINAL   Final   Organism ID, Bacteria ENTEROBACTER CLOACAE   Final  FUNGUS CULTURE W SMEAR     Status: None   Collection Time    11/14/12  9:49 AM      Result Value Range Status   Specimen Description TISSUE RIGHT FEMUR   Final   Special Requests     Final   Value: PATIENT ON FOLLOWING MAXIPINE LAST DOSE [id number]   Fungal Smear NO YEAST OR FUNGAL ELEMENTS SEEN   Final   Culture CULTURE IN PROGRESS FOR FOUR  WEEKS   Final   Report Status PENDING   Incomplete    Studies/Results: No results  found.    Assessment/Plan: [name] [name] is a 47 y.o. 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 Kelly Splinter. He actually had grown a MDR pseudomonas from bone biopsy culture in April BUT DOES Not appear to have ever been treated with effective abx against this. (he had been on Invanz and fluconazole to target the MS Coag Neg staph and C parapsilosis isolated from hip aspirate at [facility] but not on drugs vs his MDR pseudomonas such as colistin.  #1 CHronic pelvic osteomyelitis with previous isolation of MDR pseudomonas from femur in OR in April He is now sp I and D with bone for culture. Greatly appreciate Dr. Kelly Splinter taking him back for I and D.  Now his cultures from bone are growing what is an fairly PCN, CEPH R ENterobacter. It is listed as sensitive to cefepime but I am skeptical about reliabiltiy of that abx given his R to all other cephalsporins including ceftazidime tested  -I am starting IV Pincus Sanes with plan of 8 weeks of therapy He will need weekly cbc, bmp faxed to Dr. Daiva Eves @ [phone]  #2 pseudomonas and Enterobacter in urine: not clear that these are pathogens and I am not going to cover them  He needs to followup in our clinic in the next 2-3 weeks or so and I will arrange with my clinic manager  I will otherwise sign off for now  Please call with further questions.     LOS: 11 days   Acey Lav 11/18/2012, 1:13 PM

## 2012-11-19 ENCOUNTER — Telehealth: Payer: Self-pay | Admitting: Dietician

## 2012-11-19 ENCOUNTER — Encounter (HOSPITAL_BASED_OUTPATIENT_CLINIC_OR_DEPARTMENT_OTHER): Payer: Medicare Other | Attending: Plastic Surgery

## 2012-11-19 DIAGNOSIS — A419 Sepsis, unspecified organism: Secondary | ICD-10-CM

## 2012-11-19 DIAGNOSIS — L98499 Non-pressure chronic ulcer of skin of other sites with unspecified severity: Secondary | ICD-10-CM | POA: Insufficient documentation

## 2012-11-19 MED ORDER — JUVEN PO PACK: 1.0000 | PACK | Freq: Two times a day (BID) | ORAL | Status: DC

## 2012-11-19 MED ORDER — ENSURE COMPLETE PO LIQD: 237.0000 mL | Freq: Two times a day (BID) | ORAL | Status: DC

## 2012-11-19 MED ORDER — SODIUM CHLORIDE 0.9 % IV SOLN: 1.0000 g | INTRAVENOUS | Status: DC

## 2012-11-19 MED ORDER — AMLODIPINE BESYLATE 10 MG PO TABS: 5.0000 mg | ORAL_TABLET | Freq: Every morning | ORAL | Status: DC

## 2012-11-19 MED ORDER — HYDROCODONE-ACETAMINOPHEN 5-325 MG PO TABS: 1.0000 | ORAL_TABLET | Freq: Four times a day (QID) | ORAL | Status: DC | PRN

## 2012-11-19 NOTE — Progress Notes (Signed)
CSW assisting with d/c planning. Awaiting authorization from [facility]. Request being reviewed by medical director. CSW will continue to follow to assist with d/c planning to SNF.   Cori Razor LCSW [phone]

## 2012-11-19 NOTE — Progress Notes (Signed)
5 Days Post-Op  Subjective: The patient is getting ready for a nursing facility after receiving care here for his sacral ulcers.  Objective: Vital signs in last 24 hours: Temp:  [99.1 F (37.3 C)-99.6 F (37.6 C)] 99.1 F (37.3 C) (07/07 0639) Pulse Rate:  [88-110] 88 (07/07 0639) Resp:  [16-18] 18 (07/07 0639) BP: (132-137)/(77-84) 137/83 mmHg (07/07 0639) SpO2:  [91 %-97 %] 91 % (07/07 0639) Last BM Date: 11/18/12  Intake/Output from previous day: 07/06 0701 - 07/07 0700 In: 1450 [P.O.:1440; I.V.:10] Out: 2702 [Urine:2700; Stool:2] Intake/Output this shift: Total I/O In: 720 [P.O.:720] Out: 150 [Urine:150]  General appearance: alert, cooperative and no distress Incision/Wound:  Lab Results:  No results found for this basename: WBC, HGB, HCT, PLT,  in the last 72 hours BMET No results found for this basename: NA, K, CL, CO2, GLUCOSE, BUN, CREATININE, CALCIUM,  in the last 72 hours PT/INR No results found for this basename: LABPROT, INR,  in the last 72 hours ABG No results found for this basename: PHART, PCO2, PO2, HCO3,  in the last 72 hours  Studies/Results: No results found.  Anti-infectives: Anti-infectives   Start     Dose/Rate Route Frequency Ordered Stop   11/19/12 0000  sodium chloride 0.9 % SOLN 50 mL with ertapenem 1 G SOLR 1 g    Comments:  1 gm IV daily for 8 weeks . Until 01/15/2013/ please check weekly CBC and BMET and fax it to Dr Zenaida Niece dam's office   1 g 100 mL/hr over 30 Minutes Intravenous Every 24 hours 11/19/12 1002     11/18/12 1000  ertapenem (INVANZ) 1 g in sodium chloride 0.9 % 50 mL IVPB     1 g 100 mL/hr over 30 Minutes Intravenous Every 24 hours 11/18/12 0936     11/14/12 1010  polymyxin B 500,000 Units, bacitracin 50,000 Units in sodium chloride irrigation 0.9 % 500 mL irrigation  Status:  Discontinued       As needed 11/14/12 1011 11/14/12 1027   11/13/12 1200  ceFEPIme (MAXIPIME) 2 g in dextrose 5 % 50 mL IVPB  Status:  Discontinued      2 g 100 mL/hr over 30 Minutes Intravenous Every 12 hours 11/13/12 1051 11/13/12 1314   11/13/12 0600  ceFAZolin (ANCEF) IVPB 2 g/50 mL premix  Status:  Discontinued     2 g 100 mL/hr over 30 Minutes Intravenous On call to O.R. 11/12/12 1538 11/13/12 1314   11/12/12 1328  polymyxin B 500,000 Units, bacitracin 50,000 Units in sodium chloride irrigation 0.9 % 500 mL irrigation  Status:  Discontinued       As needed 11/12/12 1328 11/12/12 1410   11/10/12 1400  cefTRIAXone (ROCEPHIN) 1 g in dextrose 5 % 50 mL IVPB  Status:  Discontinued     1 g 100 mL/hr over 30 Minutes Intravenous Every 24 hours 11/10/12 1221 11/13/12 1038   11/09/12 0000  ceFAZolin (ANCEF) IVPB 2 g/50 mL premix  Status:  Discontinued     2 g 100 mL/hr over 30 Minutes Intravenous 30 min pre-op 11/08/12 1344 11/12/12 1435   11/08/12 0830  ceFAZolin (ANCEF) IVPB 2 g/50 mL premix  Status:  Discontinued     2 g 100 mL/hr over 30 Minutes Intravenous On call to O.R. 11/08/12 0826 11/08/12 1344   11/08/12 0800  vancomycin (VANCOCIN) IVPB 1000 mg/200 mL premix  Status:  Discontinued     1,000 mg 200 mL/hr over 60 Minutes Intravenous Every 12 hours  11/07/12 2004 11/08/12 1600   11/08/12 0400  piperacillin-tazobactam (ZOSYN) IVPB 3.375 g  Status:  Discontinued     3.375 g 12.5 mL/hr over 240 Minutes Intravenous Every 8 hours 11/07/12 2005 11/08/12 1600   11/07/12 1700  vancomycin (VANCOCIN) IVPB 1000 mg/200 mL premix     1,000 mg 200 mL/hr over 60 Minutes Intravenous  Once 11/07/12 1603 11/07/12 2122   11/07/12 1700  piperacillin-tazobactam (ZOSYN) IVPB 3.375 g     3.375 g 100 mL/hr over 30 Minutes Intravenous  Once 11/07/12 1603 11/07/12 2053   11/07/12 1330  cefTRIAXone (ROCEPHIN) injection 1 g     1 g Intramuscular  Once 11/07/12 1320 11/07/12 1355      Assessment/Plan: s/p Procedure(s): BONE BIOSPY OF RIGHT HIP, Wound vac change (N/A) continue wet to dry dressing changes twice daily, air mattress bed, off load, high  protein diet as able with antibiotics, vitamins daily.  Follow up in Wound care clinic in 2-3 weeks.  LOS: 12 days    [facility] 11/19/2012

## 2012-11-19 NOTE — Consult Note (Signed)
WOC consult Note Reason for Consult:Chronic Stage IV Pressure Ulcer.  S/P surgical procedure with placement of ACell graft, ACell powder and NPWT device by Dr. Wayland Denis on 11/14/12. Patient well known to our department from previous admissions. Wound type:Pressure Pressure Ulcer POA: Yes Measurement:Not observed Wound bed:Not observed Dressing procedure/placement/frequency:Instructed RN on Sunday, 11/18/12 to confer with Dr. Gonzella Lex regarding post surgical procedure orders for dressing changes from Dr. Kelly Splinter.  Dr. Gonzella Lex to contact Dr. Kelly Splinter today (11/19/12).  Patient will require dressing change and discontinuance of NPWT device prior to transfer to SNF. I will not follow.  Please re-consult if needed. Thanks, Ladona Mow, MSN, RN, [facility], CWOCN [phone])

## 2012-11-19 NOTE — Progress Notes (Signed)
Wound Vac was Discontinued and Wet to Dry dressings applied BID.  Pt has a large Right buttock wound that also has a small tunneling wound in upper right by greater trochanter. The larger right buttock area is 16cm length and 15cm width and 1cm depth. The upper right by greater trochanter measures 7cm length 3.5cm width and 6.5cm depth.  Pt has a small scrotal wound that measures 11cm length by 4.5cm width and 2.5cm depth. Above the scrotal there is a mid sacral wound that measures 12cm length by 7.5cm width and 3cm depth.  The Left buttock wound area measures 9cm length by 11cm width and 3cm depth. All wounds are pink around the edges except a 5% of yellow that is in the scrotal wound. Dressing consists of one piece of kerlex in each area with gauze on top and abd pads.

## 2012-11-19 NOTE — Plan of Care (Signed)
Problem: Phase II Progression Outcomes Goal: Obtain order to discontinue catheter if appropriate Outcome: Not Applicable Date Met:  11/19/12 Chronic cath use. Suprapubic Catheter.

## 2012-11-19 NOTE — Discharge Summary (Addendum)
Physician Discharge Summary  [name] [name] ZOX:[id number] DOB: 04/08/1966 DOA: 11/07/2012  PCP: [name], MD  Admit date: 11/07/2012 Discharge date: 11/20/2012  Time spent: 40  minutes  Recommendations for Outpatient Follow-up:  D/c to SNF Follow up with Dr Kelly Splinter in 2 weeks Follow up with Dr Zenaida Niece dam in 3 weeks. Please check CBC and BMET every week while patient is on IV antibiotics and fax it to Dr Zenaida Niece dam [phone]  Discharge Diagnoses:   Principal Problem:   Sepsis due to infected sacral decubitus ulcer  Active Problems:   Sacral decubitus ulcer, stage IV   Complicated UTI (urinary tract infection)   Protein calorie malnutrition   Quadriplegia   Leukocytosis   Seizure disorder   HTN (hypertension)   Microcytic anemia   OSA on CPAP Iron deficiency anemia   Discharge Condition: fair  Diet recommendation:regular  Filed Weights   11/09/12 0600 11/10/12 0600 11/11/12 0510  Weight: 104.1 kg (229 lb 8 oz) 104 kg (229 lb 4.5 oz) 104.8 kg (231 lb 0.7 oz)    History of present illness:  Please refer to admission H&P for details, but in brief, 47 y/o quadriplegic male with stage 4 decubitus ulcer admitted for increase foul smelling drainage from the decubitus ulcer associated with leucocytosis and diaphoresis. He had recently completed a course of vancomycin and invanz following I&D Of the sacral decub. Patient started on IV vanco and zosyn on 6/25 . I&D done on 6/30. Patient taken off abx. As per ID recommendation patient taken to OR On 7/2 for bone bx and culture.    Hospital Course:   Sepsis  Sepsis with stage 4 Decubitus ulcer, chronic osteomyelitis right femur:  -Patient presented on 6/25 with worsening drainage from wound, diaphoresis, leukocytosis.  -Started on IV vancomycin and Zosyn empirically. Antibiotics stopped as patient planned for bone biopsy. Also received Ceftriaxone for UTI. Wound cx previously grew pseudomonas.  Patient had I and D on 6/ 30 . cx not  sent. Bone bx per ID recommendation done on 7/2. Preliminary cx growing. Pending final cx still pending. Had irrigation and debridement of the ulcer with placement of Acell powder and Acell sheet.  -cx from bone bx grew rare enterobacter and was sensitive to cefepime but resistant to all other cephalosporins. ID recommended treating with IV Ertapenem 1 gm daily for 8 weeks . Recommend to check CBC and BMET once a week and fax results to his office. Dr Zenaida Niece dam will see pt in 2-3 weeks. He already has a PICC line from previous long term IV abx use.  -discussed with Dr Kelly Splinter today about dressing changes. She recommends wet to dry dressing twice daily and will see pt in 2 weeks in the office.   Mycrocitic Anemia:  Hb 6.4 on admission. Received 2 units of PRBC on 6/25 and 1 u on 6/29. Hb stable. Continue with iron supplement.   C5 Quadriplegic:  Continue with support Care. Has colostomy and suprapubic catheter Will order to change suprapubic catheter prior to discharge  Leukocytosis:  Likely secondary to decubitus ulcer worsening infection, UTI. cx so far negative. CXR normal   UTI:  urine culture from 6/26 growing pseudmonas. Received 3 days. Of rocephin abx. ID recommends not to treat furthersince this is unclear of a true infection  -Hypotension:  Resolved. Resumed Norvasc at a lower dose ( 5 mg daily)  Malnutrition:  Added ensure  OSA  on CPAP at bedtime   Patient remains afebrile and leucocytosis resolved. He is  clinically stable for discharge to SNF for ongoing care.   Code Status: Full Code.  Family Communication: none at bedside  Disposition Plan:  D/c to SNF   Consultants:  Dr Kelly Splinter  Dr Zenaida Niece dam    Procedures:  irrigation and debridement of the ulcer with placement of Acell powder and Acell sheet.   Antibiotics:  Vancomycin 6-25// 6-26  Zosyn 6-25 // 6-26  Ceftriaxone 6-28 stopped 7-1.  IV ertapenem ( 7/6>> until 9/2  Discharge Exam: Filed Vitals:   11/18/12 0520  11/18/12 1419 11/18/12 2117 11/19/12 0639  BP: 145/68 133/84 132/77 137/83  Pulse:  110 100 88  Temp:  99.6 F (37.6 C) 99.5 F (37.5 C) 99.1 F (37.3 C)  TempSrc:  Oral Oral Oral  Resp:  16 18 18   Height:      Weight:      SpO2:  97% 96% 91%    General: middle aged obese male in NAD  HEENT: No pallor , moist mucosa  Chest: clear b/l, no added sounds  Cardiovascular: NS1&S2, no murmurs, rubs or gallop  Abdomen: soft, NT, 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  Musculoskeletal: quadriplegic, stage IV pressure ulcers over sacrum and ischium , A cell sheet over the ulcer  CNS: AAOX3   Discharge Instructions     Medication List    TAKE these medications       albuterol (2.5 MG/3ML) 0.083% nebulizer solution  Commonly known as:  PROVENTIL  Take 2.5 mg by nebulization every 6 (six) hours as needed for wheezing or shortness of breath. Shortness of breath     amLODipine 10 MG tablet  Commonly known as:  NORVASC  Take 0.5 tablets (5 mg total) by mouth every morning.     baclofen 20 MG tablet  Commonly known as:  LIORESAL  Take 20 mg by mouth 4 (four) times daily.     docusate sodium 100 MG capsule  Commonly known as:  COLACE  Take 100 mg by mouth 2 (two) times daily.     famotidine 20 MG tablet  Commonly known as:  PEPCID  Take 20 mg by mouth 2 (two) times daily.     FERRALET 90 PO  Take 1 capsule by mouth every morning.     HYDROcodone-acetaminophen 5-325 MG per tablet  Commonly known as:  NORCO/VICODIN  Take 1 tablet by mouth every 6 (six) hours as needed.     metoCLOPramide 10 MG tablet  Commonly known as:  REGLAN  Take 1 tablet (10 mg total) by mouth 3 (three) times daily before meals.     multivitamin with minerals Tabs  Take 1 tablet by mouth every morning.     nutrition supplement Pack  Take 1 packet by mouth 2 (two) times daily between meals.     feeding supplement Liqd  Take 237 mLs by mouth 2 (two) times daily between meals.      potassium chloride SA 20 MEQ tablet  Commonly known as:  K-DUR,KLOR-CON  Take 20 mEq by mouth every morning.     sodium chloride 0.9 % SOLN 50 mL with ertapenem 1 G SOLR 1 g  Inject 1 g into the vein daily. ( until 01/15/13)     vitamin C 500 MG tablet  Commonly known as:  ASCORBIC ACID  Take 500 mg by mouth every morning.     zinc sulfate 220 MG capsule  Take 220 mg by mouth every morning.  furosemide 20 MG tablet  Commonly known as:  LASIX  Take 20 mg by mouth every morning.       No Known Allergies     Follow-up Information   Follow up with [facility]              In 2 weeks.   Contact information:   [address] [place] [address] [phone]      Follow up with Acey Lav, MD In 3 weeks. (please check and fax CBC  and BMET every week to Dr Zenaida Niece dam's office at [phone])    Contact information:   [address] [place] [address] [phone]        The results of significant diagnostics from this hospitalization (including imaging, microbiology, ancillary and laboratory) are listed below for reference.    Significant Diagnostic Studies: Dg Chest 2 View  11/07/2012   *RADIOLOGY REPORT*  Clinical Data: Hypertension, diabetes, asthma, elevated white count  CHEST - 2 VIEW  Comparison: 09/01/2012  Findings: Limited exam because of obese body habitus and positioning.  Left PICC line tip proximal SVC.  Stable cardiomegaly without CHF or pneumonia.  No focal collapse consolidation. Limited lateral views because of overlapping soft tissue shadows and arm positions.  Chronic apical scarring bilaterally.  IMPRESSION: Limited exam but no gross interval change or acute process   Original Report Authenticated By: Judie Petit. Miles Costain, M.D.   Ir Fluoro Guide Cv Line Left  11/07/2012   *RADIOLOGY REPORT*  Indication: Poor venous access  ULTRASOUND AND FLUORSCOPIC GUIDED PICC LINE INSERTION   Intravenous Medications: None  Contrast: None  Fluoroscopy Time:  1 minute, 18-seconds.  Complications: None immediate  Technique / Findings:  The procedure, risks, benefits, and alternatives were explained to the patient and informed written consent was obtained.  A timeout was performed prior to the initiation of the procedure.  Initially, the right upper extremity was prepped with chlorhexidine in a sterile fashion, and a sterile drape was applied covering the operative field.  Maximum barrier sterile technique with sterile gowns and gloves were used for the procedure.  A timeout was performed prior to the initiation of the procedure.  Local anesthesia was provided with 1% lidocaine.  Ultrasound scanning failed to delineate a patent cephalic, brachial or basilic vein within the right upper extremity.  Limited attempt was made to cannulate a hypertrophied venous collateral within the right upper arm however this ultimately proved unsuccessful.  As such, the left upper extremity was prepped and draped in the usual sterile fashion.  Ultrasound scanning failed to delineate pain and left-sided basilic or cephalic vein.  The left brachial vein was noted to be atretic, and likely only amendable to access at the level of the antecubital fossa. As such, under direct ultrasound guidance, the leftbrachialvein was accessed with a micropuncture kit after the overlying soft tissues were anesthetized with 1% lidocaine.  An ultrasound image was saved for documentation purposes.  A guidewire was advanced to the level of the superior caval-atrial junction for measurement purposes.  A peel-away sheath was placed and a 55 cm, 5 [place], dual lumen was inserted to level of the superior aspect of the SVC (maximum catheter length was reached at this location).  A post procedure spot fluoroscopic was obtained.  The catheter easily aspirated and flushed and was sutured in place.  A dressing was placed.  The patient tolerated the procedure  well without  immediate post procedural complication.  Impression:  1.  Successful ultrasound and fluoroscopic guided placement of a left brachial vein approach, 55 cm, 5 French,dual lumen PICC with tip terminating within the superior aspect of the SVC.  The catheter cannot be advanced further as this is the maximum length of the PICC line. The PICC line is ready for immediate use.  2.  Nonvisualization of a patent right brachial, basilic or cephalic vein.  PLAN:  This patient will likely require jugular venous access in the future.   Original Report Authenticated By: Tacey Ruiz, MD   Ir US Guide Vasc Access Left  11/07/2012   *RADIOLOGY REPORT*  Indication: Poor venous access  ULTRASOUND AND FLUORSCOPIC GUIDED PICC LINE INSERTION  Intravenous Medications: None  Contrast: None  Fluoroscopy Time:  1 minute, 18-seconds.  Complications: None immediate  Technique / Findings:  The procedure, risks, benefits, and alternatives were explained to the patient and informed written consent was obtained.  A timeout was performed prior to the initiation of the procedure.  Initially, the right upper extremity was prepped with chlorhexidine in a sterile fashion, and a sterile drape was applied covering the operative field.  Maximum barrier sterile technique with sterile gowns and gloves were used for the procedure.  A timeout was performed prior to the initiation of the procedure.  Local anesthesia was provided with 1% lidocaine.  Ultrasound scanning failed to delineate a patent cephalic, brachial or basilic vein within the right upper extremity.  Limited attempt was made to cannulate a hypertrophied venous collateral within the right upper arm however this ultimately proved unsuccessful.  As such, the left upper extremity was prepped and draped in the usual sterile fashion.  Ultrasound scanning failed to delineate pain and left-sided basilic or cephalic vein.  The left brachial vein was noted to be atretic, and likely only  amendable to access at the level of the antecubital fossa. As such, under direct ultrasound guidance, the leftbrachialvein was accessed with a micropuncture kit after the overlying soft tissues were anesthetized with 1% lidocaine.  An ultrasound image was saved for documentation purposes.  A guidewire was advanced to the level of the superior caval-atrial junction for measurement purposes.  A peel-away sheath was placed and a 55 cm, 5 [place], dual lumen was inserted to level of the superior aspect of the SVC (maximum catheter length was reached at this location).  A post procedure spot fluoroscopic was obtained.  The catheter easily aspirated and flushed and was sutured in place.  A dressing was placed.  The patient tolerated the procedure well without immediate post procedural complication.  Impression:  1.  Successful ultrasound and fluoroscopic guided placement of a left brachial vein approach, 55 cm, 5 French,dual lumen PICC with tip terminating within the superior aspect of the SVC.  The catheter cannot be advanced further as this is the maximum length of the PICC line. The PICC line is ready for immediate use.  2.  Nonvisualization of a patent right brachial, basilic or cephalic vein.  PLAN:  This patient will likely require jugular venous access in the future.   Original Report Authenticated By: Tacey Ruiz, MD    Microbiology: Recent Results (from the past 240 hour(s))  TISSUE CULTURE     Status: None   Collection Time    11/14/12  9:49 AM      Result Value Range Status   Specimen Description TISSUE RT FEMUR   Final   Special Requests  Final   Value: PATIENT ON FOLLOWING MAXIPINE LAST DOSE [id number]   Gram Stain     Final   Value: FEW WBC PRESENT, PREDOMINANTLY PMN     NO ORGANISMS SEEN   Culture RARE ENTEROBACTER CLOACAE   Final   Report Status 11/18/2012 FINAL   Final   Organism ID, Bacteria ENTEROBACTER CLOACAE   Final  FUNGUS CULTURE W SMEAR     Status: None   Collection Time     11/14/12  9:49 AM      Result Value Range Status   Specimen Description TISSUE RIGHT FEMUR   Final   Special Requests     Final   Value: PATIENT ON FOLLOWING MAXIPINE LAST DOSE [id number]   Fungal Smear NO YEAST OR FUNGAL ELEMENTS SEEN   Final   Culture CULTURE IN PROGRESS FOR FOUR WEEKS   Final   Report Status PENDING   Incomplete     Labs: Basic Metabolic Panel:  Recent Labs Lab 11/13/12 1155  NA 136  K 3.8  CL 101  CO2 29  GLUCOSE 104*  BUN 10  CREATININE 0.37*  CALCIUM 8.9   Liver Function Tests: No results found for this basename: AST, ALT, ALKPHOS, BILITOT, PROT, ALBUMIN,  in the last 168 hours No results found for this basename: LIPASE, AMYLASE,  in the last 168 hours No results found for this basename: AMMONIA,  in the last 168 hours CBC:  Recent Labs Lab 11/13/12 1155 11/16/12 0530  WBC 12.9* 12.1*  HGB 8.5* 8.1*  HCT 27.8* 26.7*  MCV 81.3 81.2  PLT 497* 475*   Cardiac Enzymes: No results found for this basename: CKTOTAL, CKMB, CKMBINDEX, TROPONINI,  in the last 168 hours BNP: BNP (last 3 results) No results found for this basename: PROBNP,  in the last 8760 hours CBG: No results found for this basename: GLUCAP,  in the last 168 hours     Signed:  Hollan Philipp  [facility] 11/19/2012, 10:06 AM

## 2012-11-20 MED ORDER — HEPARIN SOD (PORK) LOCK FLUSH 100 UNIT/ML IV SOLN
250.0000 [IU] | INTRAVENOUS | Status: AC | PRN
  Administered 2012-11-20: 500 [IU]

## 2012-11-20 NOTE — Progress Notes (Signed)
Clinical Social Work  CSW received authorization from [employer] #[id number]. CSW spoke with SNF who is agreeable to admission. CSW prepared DC packet with FL2 and hard scripts included. CSW updated patient and RN on DC plans and both parties agreeable. CSW cooridnated transportation via [facility] for 12:30pm pick up. Request # [id number]. CSW is signing off but available if needed.  Unk Lightning, LCSW (Coverage for [employer])

## 2012-11-20 NOTE — Progress Notes (Signed)
Patient seen and examined this morning. No overnight issues. Discharge summary completed on 7/7. patient ready for discharge today.

## 2012-11-20 NOTE — Progress Notes (Signed)
Discussed in LOS meeting. Pt will discharge today to SNF per report of CSW.

## 2012-11-20 NOTE — Progress Notes (Signed)
Clinical Social Work Department CLINICAL SOCIAL WORK PLACEMENT NOTE 11/20/2012  Patient:  [name] [name], [name] [name]  Account Number:  [id number] Admit date:  11/07/2012  Clinical Social Worker:  Unk Lightning, LCSW  Date/time:  11/20/2012 11:30 AM  Clinical Social Work is seeking post-discharge placement for this patient at the following level of care:   SKILLED NURSING   (*CSW will update this form in Epic as items are completed)   11/12/2012  Patient/family provided with [facility] Department of Clinical Social Work's list of facilities offering this level of care within the geographic area requested by the patient (or if unable, by the patient's family).  11/12/2012  Patient/family informed of their freedom to choose among providers that offer the needed level of care, that participate in Medicare, Medicaid or managed care program needed by the patient, have an available bed and are willing to accept the patient.  11/12/2012  Patient/family informed of [facility]' ownership interest in [facility], as well as of the fact that they are under no obligation to receive care at this facility.  PASARR submitted to EDS on existing # PASARR number received from [employer] on   FL2 transmitted to all facilities in geographic area requested by pt/family on  11/12/2012 FL2 transmitted to all facilities within larger geographic area on   Patient informed that his/her managed care company has contracts with or will negotiate with  certain facilities, including the following:     Patient/family informed of bed offers received:  11/14/2012 Patient chooses bed at [facility] Physician recommends and patient chooses bed at    Patient to be transferred to [facility] on  11/20/2012 Patient to be transferred to facility by [facility]  The following physician request were entered in Epic:   Additional Comments:

## 2012-11-20 NOTE — Progress Notes (Signed)
NUTRITION FOLLOW UP  Intervention:   - Pt eating excellent with no nutritional concerns, nutrition signing off  Nutrition Dx:   Increased nutrient needs related to wound healing as evidenced by stage 4 pressure ulcer; ongoing  Goal:   Pt to meet >/= 90% of their estimated nutrition needs; being met   Assessment:   S/p irrigation and debridement of sacral ulcer with placement of cell and vac on 6/30  S/p right hip bone biopsy with wound VAC change 7/2  Met with pt who reports eating 100% of meals and drinking 100% of nutritional supplements and taking all of his vitamins.   Height: Ht Readings from Last 1 Encounters:  11/10/12 5\' 7"  (1.702 m)    Weight Status:   Wt Readings from Last 1 Encounters:  11/11/12 231 lb 0.7 oz (104.8 kg)    Re-estimated needs:  Kcal: 2100-2530  Protein: 126-147 grams  Fluid: 2.9 L  Skin: Stage 4 sacral ulcer s/p irrigation and debridement  Diet Order: General   Intake/Output Summary (Last 24 hours) at 11/20/12 1002 Last data filed at 11/20/12 0528  Gross per 24 hour  Intake   1000 ml  Output   2320 ml  Net  -1320 ml    Last BM: 7/7   Labs:   Recent Labs Lab 11/13/12 1155  NA 136  K 3.8  CL 101  CO2 29  BUN 10  CREATININE 0.37*  CALCIUM 8.9  GLUCOSE 104*    CBG (last 3)  No results found for this basename: GLUCAP,  in the last 72 hours  Scheduled Meds: . amLODipine  5 mg Oral Daily  . baclofen  20 mg Oral QID  . docusate sodium  100 mg Oral BID  . enoxaparin (LOVENOX) injection  40 mg Subcutaneous Q24H  . ertapenem  1 g Intravenous Q24H  . famotidine  20 mg Oral BID  . feeding supplement  237 mL Oral BID BM  . ferrous sulfate  325 mg Oral BID WC  . metoCLOPramide  10 mg Oral TID AC  . multivitamin with minerals  1 tablet Oral q morning - 10a  . nutrition supplement  1 packet Oral BID BM  . sodium chloride  10-40 mL Intracatheter Q12H  . vitamin C  500 mg Oral q morning - 10a  . zinc sulfate  220 mg Oral q  morning - 10a       Levon Hedger MS, RD, LDN [phone] Pager [phone] After Hours Pager

## 2012-12-04 NOTE — Progress Notes (Signed)
[facility]  NAME:  [name], [name] NO.:  [id number]  MEDICAL RECORD NO.:  [id number]      DATE OF BIRTH:  1965/12/25  PHYSICIAN:  Wayland Denis, DO       VISIT DATE:  12/03/2012                                  OFFICE VISIT   The patient is a 47 year old black male, who is here for followup on his sacral ulcers.  Overall, he is actually doing better in the nursing facility.  The wounds are clean.  There is signs of granulation.  His pre-albumin has been a problem, it sounds like he is getting at least great efforts towards increasing his protein intake.  We did debride him twice and he had a very resistant bug in the bone, Dr. Daiva Eves is treating that with IV antibiotics and he is getting that in the nursing facility.  We will continue with wet-to-dry dressings b.i.d. and offloading, continue with protein, multivitamin, vitamin C, and we will see him back in 3 months.     Wayland Denis, DO     CS/MEDQ  D:  12/03/2012  T:  12/04/2012  Job:  [id number]

## 2012-12-10 ENCOUNTER — Ambulatory Visit (INDEPENDENT_AMBULATORY_CARE_PROVIDER_SITE_OTHER): Payer: Medicare Other | Admitting: Infectious Diseases

## 2012-12-10 ENCOUNTER — Encounter: Payer: Self-pay | Admitting: Infectious Diseases

## 2012-12-10 VITALS — BP 145/83 | HR 108 | Temp 98.8°F

## 2012-12-10 DIAGNOSIS — L89109 Pressure ulcer of unspecified part of back, unspecified stage: Secondary | ICD-10-CM

## 2012-12-10 DIAGNOSIS — L8994 Pressure ulcer of unspecified site, stage 4: Secondary | ICD-10-CM

## 2012-12-10 DIAGNOSIS — L89154 Pressure ulcer of sacral region, stage 4: Secondary | ICD-10-CM

## 2012-12-10 NOTE — Assessment & Plan Note (Signed)
He appears to be doing well. Will continue his anbx as planned. He has concerns that this will recur with stopping his anbx. I suggested that rather than giving him chronic anbx (and encouraging resistant bacteria) we would follow him more closely as an outpt. Will see him back in mid-September after he has completed his anbx.  He has wound f/u, he has nutritional care, he has offloading mattress.
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
## 2012-12-11 ENCOUNTER — Encounter: Payer: Self-pay | Admitting: Infectious Diseases

## 2012-12-11 LAB — FUNGUS CULTURE W SMEAR: Fungal Smear: NONE SEEN

## 2012-12-18 ENCOUNTER — Encounter: Payer: Self-pay | Admitting: Internal Medicine

## 2013-01-24 ENCOUNTER — Encounter (HOSPITAL_COMMUNITY): Payer: Self-pay | Admitting: Emergency Medicine

## 2013-01-24 ENCOUNTER — Emergency Department (HOSPITAL_COMMUNITY): Payer: Medicare Other

## 2013-01-24 ENCOUNTER — Inpatient Hospital Stay (HOSPITAL_COMMUNITY)
Admission: EM | Admit: 2013-01-24 | Discharge: 2013-02-01 | DRG: 689 | Disposition: A | Discharge status: Skilled Nursing Facility | Payer: Medicare Other | Attending: Family Medicine | Admitting: Family Medicine

## 2013-01-24 DIAGNOSIS — L8994 Pressure ulcer of unspecified site, stage 4: Secondary | ICD-10-CM | POA: Diagnosis present

## 2013-01-24 DIAGNOSIS — E871 Hypo-osmolality and hyponatremia: Secondary | ICD-10-CM | POA: Diagnosis present

## 2013-01-24 DIAGNOSIS — N390 Urinary tract infection, site not specified: Secondary | ICD-10-CM | POA: Diagnosis present

## 2013-01-24 DIAGNOSIS — E662 Morbid (severe) obesity with alveolar hypoventilation: Secondary | ICD-10-CM | POA: Diagnosis present

## 2013-01-24 DIAGNOSIS — B965 Pseudomonas (aeruginosa) (mallei) (pseudomallei) as the cause of diseases classified elsewhere: Secondary | ICD-10-CM | POA: Diagnosis present

## 2013-01-24 DIAGNOSIS — Z9359 Other cystostomy status: Secondary | ICD-10-CM

## 2013-01-24 DIAGNOSIS — G4733 Obstructive sleep apnea (adult) (pediatric): Secondary | ICD-10-CM | POA: Diagnosis present

## 2013-01-24 DIAGNOSIS — D72829 Elevated white blood cell count, unspecified: Secondary | ICD-10-CM

## 2013-01-24 DIAGNOSIS — M009 Pyogenic arthritis, unspecified: Secondary | ICD-10-CM | POA: Diagnosis present

## 2013-01-24 DIAGNOSIS — D500 Iron deficiency anemia secondary to blood loss (chronic): Secondary | ICD-10-CM | POA: Diagnosis present

## 2013-01-24 DIAGNOSIS — R112 Nausea with vomiting, unspecified: Secondary | ICD-10-CM

## 2013-01-24 DIAGNOSIS — M86651 Other chronic osteomyelitis, right thigh: Secondary | ICD-10-CM

## 2013-01-24 DIAGNOSIS — D638 Anemia in other chronic diseases classified elsewhere: Secondary | ICD-10-CM | POA: Diagnosis present

## 2013-01-24 DIAGNOSIS — Z6831 Body mass index (BMI) 31.0-31.9, adult: Secondary | ICD-10-CM

## 2013-01-24 DIAGNOSIS — E876 Hypokalemia: Secondary | ICD-10-CM | POA: Diagnosis present

## 2013-01-24 DIAGNOSIS — G8250 Quadriplegia, unspecified: Secondary | ICD-10-CM | POA: Diagnosis present

## 2013-01-24 DIAGNOSIS — K311 Adult hypertrophic pyloric stenosis: Secondary | ICD-10-CM | POA: Diagnosis not present

## 2013-01-24 DIAGNOSIS — Z8701 Personal history of pneumonia (recurrent): Secondary | ICD-10-CM

## 2013-01-24 DIAGNOSIS — D649 Anemia, unspecified: Secondary | ICD-10-CM | POA: Diagnosis present

## 2013-01-24 DIAGNOSIS — L89154 Pressure ulcer of sacral region, stage 4: Secondary | ICD-10-CM | POA: Diagnosis present

## 2013-01-24 DIAGNOSIS — L89109 Pressure ulcer of unspecified part of back, unspecified stage: Secondary | ICD-10-CM | POA: Diagnosis present

## 2013-01-24 DIAGNOSIS — Z8711 Personal history of peptic ulcer disease: Secondary | ICD-10-CM

## 2013-01-24 DIAGNOSIS — J45909 Unspecified asthma, uncomplicated: Secondary | ICD-10-CM | POA: Diagnosis present

## 2013-01-24 DIAGNOSIS — D509 Iron deficiency anemia, unspecified: Secondary | ICD-10-CM

## 2013-01-24 DIAGNOSIS — J9610 Chronic respiratory failure, unspecified whether with hypoxia or hypercapnia: Secondary | ICD-10-CM | POA: Diagnosis present

## 2013-01-24 DIAGNOSIS — Z933 Colostomy status: Secondary | ICD-10-CM

## 2013-01-24 DIAGNOSIS — I10 Essential (primary) hypertension: Secondary | ICD-10-CM

## 2013-01-24 DIAGNOSIS — Z936 Other artificial openings of urinary tract status: Secondary | ICD-10-CM

## 2013-01-24 DIAGNOSIS — M86659 Other chronic osteomyelitis, unspecified thigh: Secondary | ICD-10-CM | POA: Diagnosis present

## 2013-01-24 DIAGNOSIS — Z79899 Other long term (current) drug therapy: Secondary | ICD-10-CM

## 2013-01-24 DIAGNOSIS — R509 Fever, unspecified: Secondary | ICD-10-CM | POA: Diagnosis present

## 2013-01-24 DIAGNOSIS — G40909 Epilepsy, unspecified, not intractable, without status epilepticus: Secondary | ICD-10-CM | POA: Diagnosis present

## 2013-01-24 LAB — URINALYSIS W MICROSCOPIC + REFLEX CULTURE
Glucose, UA: NEGATIVE mg/dL
Hgb urine dipstick: NEGATIVE
Ketones, ur: NEGATIVE mg/dL
Protein, ur: NEGATIVE mg/dL

## 2013-01-24 LAB — CBC WITH DIFFERENTIAL/PLATELET
Basophils Absolute: 0 10*3/uL (ref 0.0–0.1)
Eosinophils Absolute: 0.2 10*3/uL (ref 0.0–0.7)
Lymphocytes Relative: 10 % — ABNORMAL LOW (ref 12–46)
MCHC: 29.6 g/dL — ABNORMAL LOW (ref 30.0–36.0)
Neutrophils Relative %: 77 % (ref 43–77)
RDW: 18.1 % — ABNORMAL HIGH (ref 11.5–15.5)

## 2013-01-24 LAB — BASIC METABOLIC PANEL
BUN: 16 mg/dL (ref 6–23)
Creatinine, Ser: 0.47 mg/dL — ABNORMAL LOW (ref 0.50–1.35)
GFR calc Af Amer: >90 mL/min (ref >90)
GFR calc non Af Amer: >90 mL/min (ref >90)

## 2013-01-24 MED ORDER — DEXTROSE 5 % IV SOLN
1.0000 g | Freq: Once | INTRAVENOUS | Status: AC
  Administered 2013-01-25: 1 g via INTRAVENOUS
  Filled 2013-01-24: qty 10

## 2013-01-24 MED ORDER — SODIUM CHLORIDE 0.9 % IV SOLN
INTRAVENOUS | Status: DC
  Administered 2013-01-24: 23:00:00 via INTRAVENOUS
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
## 2013-01-24 NOTE — ED Notes (Signed)
Per EMS, Patient from [facility]. Patient seen last Wednesday , dx with UTI, was started on Rocephin, usually given Cipro abx for infections. Patient febrile 102.4 earlier today 1 hour after tylenol, currently 101.0 oral. Lungs CTA with O2 sat 86 on room air, placed on 3L 92-94%. Denies SOB, reports feeling hot/cold. Patient with L PICC line in place.
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
## 2013-01-24 NOTE — ED Notes (Signed)
Bed: WA10 Expected date: 01/24/13 Expected time: 9:01 PM Means of arrival: Ambulance Comments: 47 yo M  Fever, hypoxia, recent UTI

## 2013-01-25 ENCOUNTER — Encounter (HOSPITAL_COMMUNITY): Payer: Self-pay | Admitting: General Practice

## 2013-01-25 ENCOUNTER — Inpatient Hospital Stay (HOSPITAL_COMMUNITY): Payer: Medicare Other

## 2013-01-25 DIAGNOSIS — E871 Hypo-osmolality and hyponatremia: Secondary | ICD-10-CM

## 2013-01-25 DIAGNOSIS — G40909 Epilepsy, unspecified, not intractable, without status epilepticus: Secondary | ICD-10-CM

## 2013-01-25 DIAGNOSIS — T80218A Other infection due to central venous catheter, initial encounter: Secondary | ICD-10-CM

## 2013-01-25 DIAGNOSIS — R509 Fever, unspecified: Secondary | ICD-10-CM

## 2013-01-25 DIAGNOSIS — M86659 Other chronic osteomyelitis, unspecified thigh: Secondary | ICD-10-CM

## 2013-01-25 DIAGNOSIS — L8994 Pressure ulcer of unspecified site, stage 4: Secondary | ICD-10-CM

## 2013-01-25 DIAGNOSIS — N390 Urinary tract infection, site not specified: Principal | ICD-10-CM

## 2013-01-25 DIAGNOSIS — A0472 Enterocolitis due to Clostridium difficile, not specified as recurrent: Secondary | ICD-10-CM

## 2013-01-25 DIAGNOSIS — L89109 Pressure ulcer of unspecified part of back, unspecified stage: Secondary | ICD-10-CM

## 2013-01-25 LAB — BASIC METABOLIC PANEL
Chloride: 93 mEq/L — ABNORMAL LOW (ref 96–112)
GFR calc Af Amer: >90 mL/min (ref >90)
Potassium: 3.1 mEq/L — ABNORMAL LOW (ref 3.5–5.1)
Sodium: 127 mEq/L — ABNORMAL LOW (ref 135–145)

## 2013-01-25 LAB — CBC
HCT: 22.5 % — ABNORMAL LOW (ref 39.0–52.0)
Hemoglobin: 6.9 g/dL — CL (ref 13.0–17.0)
RDW: 19.4 % — ABNORMAL HIGH (ref 11.5–15.5)
WBC: 26.9 10*3/uL — ABNORMAL HIGH (ref 4.0–10.5)

## 2013-01-25 LAB — PROTIME-INR: INR: 1.3 (ref 0.00–1.49)

## 2013-01-25 MED ORDER — IOHEXOL 300 MG/ML  SOLN
100.0000 mL | Freq: Once | INTRAMUSCULAR | Status: AC | PRN
  Administered 2013-01-25: 100 mL via INTRAVENOUS

## 2013-01-25 MED ORDER — VANCOMYCIN HCL IN DEXTROSE 1-5 GM/200ML-% IV SOLN
1000.0000 mg | Freq: Two times a day (BID) | INTRAVENOUS | Status: DC
  Administered 2013-01-25 – 2013-01-28 (×6): 1000 mg via INTRAVENOUS
  Filled 2013-01-25 (×6): qty 200

## 2013-01-25 MED ORDER — POTASSIUM CHLORIDE CRYS ER 20 MEQ PO TBCR
20.0000 meq | EXTENDED_RELEASE_TABLET | Freq: Every morning | ORAL | Status: DC
  Administered 2013-01-25: 20 meq via ORAL
  Filled 2013-01-25: qty 1

## 2013-01-25 MED ORDER — SODIUM CHLORIDE 0.9 % IV SOLN
1.0000 g | INTRAVENOUS | Status: DC
  Administered 2013-01-25 – 2013-01-26 (×2): 1 g via INTRAVENOUS
  Filled 2013-01-25 (×2): qty 1

## 2013-01-25 MED ORDER — DOCUSATE SODIUM 100 MG PO CAPS
100.0000 mg | ORAL_CAPSULE | Freq: Two times a day (BID) | ORAL | Status: DC
  Administered 2013-01-25 – 2013-01-29 (×7): 100 mg via ORAL
  Filled 2013-01-25 (×10): qty 1

## 2013-01-25 MED ORDER — FUROSEMIDE 20 MG PO TABS
20.0000 mg | ORAL_TABLET | Freq: Every morning | ORAL | Status: DC
  Administered 2013-01-25 – 2013-01-29 (×4): 20 mg via ORAL
  Filled 2013-01-25 (×5): qty 1

## 2013-01-25 MED ORDER — SODIUM CHLORIDE 0.9 % IJ SOLN: 3.0000 mL | INTRAMUSCULAR | Status: DC | PRN

## 2013-01-25 MED ORDER — BACLOFEN 20 MG PO TABS
20.0000 mg | ORAL_TABLET | Freq: Four times a day (QID) | ORAL | Status: DC
  Administered 2013-01-25 – 2013-01-29 (×13): 20 mg via ORAL
  Filled 2013-01-25 (×20): qty 1

## 2013-01-25 MED ORDER — POTASSIUM CHLORIDE CRYS ER 20 MEQ PO TBCR
40.0000 meq | EXTENDED_RELEASE_TABLET | Freq: Every morning | ORAL | Status: DC
  Administered 2013-01-26 – 2013-01-29 (×3): 40 meq via ORAL
  Filled 2013-01-25 (×4): qty 2

## 2013-01-25 MED ORDER — KETOROLAC TROMETHAMINE 10 MG PO TABS
10.0000 mg | ORAL_TABLET | Freq: Once | ORAL | Status: AC
  Administered 2013-01-25: 10 mg via ORAL
  Filled 2013-01-25: qty 1

## 2013-01-25 MED ORDER — FUROSEMIDE 10 MG/ML IJ SOLN
40.0000 mg | Freq: Once | INTRAMUSCULAR | Status: AC
  Administered 2013-01-25: 40 mg via INTRAVENOUS
  Filled 2013-01-25: qty 4

## 2013-01-25 MED ORDER — SODIUM CHLORIDE 0.9 % IV SOLN: INTRAVENOUS | Status: DC

## 2013-01-25 MED ORDER — SODIUM CHLORIDE 0.9 % IV SOLN
INTRAVENOUS | Status: DC
  Administered 2013-01-26: 50 mL/h via INTRAVENOUS
  Administered 2013-01-26: 01:00:00 via INTRAVENOUS
  Administered 2013-01-27: 50 mL/h via INTRAVENOUS
  Administered 2013-01-29: 09:00:00 via INTRAVENOUS

## 2013-01-25 MED ORDER — ONDANSETRON HCL 4 MG/2ML IJ SOLN
4.0000 mg | Freq: Four times a day (QID) | INTRAMUSCULAR | Status: DC | PRN
  Administered 2013-01-27: 4 mg via INTRAVENOUS
  Filled 2013-01-25: qty 2

## 2013-01-25 MED ORDER — VITAMIN C 500 MG PO TABS
500.0000 mg | ORAL_TABLET | Freq: Every morning | ORAL | Status: DC
  Administered 2013-01-25 – 2013-01-29 (×4): 500 mg via ORAL
  Filled 2013-01-25 (×5): qty 1

## 2013-01-25 MED ORDER — DIPHENHYDRAMINE HCL 25 MG PO CAPS
25.0000 mg | ORAL_CAPSULE | Freq: Once | ORAL | Status: AC
  Administered 2013-01-25: 25 mg via ORAL
  Filled 2013-01-25: qty 1

## 2013-01-25 MED ORDER — ADULT MULTIVITAMIN W/MINERALS CH
1.0000 | ORAL_TABLET | Freq: Every morning | ORAL | Status: DC
  Administered 2013-01-25 – 2013-01-29 (×4): 1 via ORAL
  Filled 2013-01-25 (×5): qty 1

## 2013-01-25 MED ORDER — SODIUM CHLORIDE 0.9 % IV SOLN: 250.0000 mL | INTRAVENOUS | Status: DC | PRN

## 2013-01-25 MED ORDER — ACETAMINOPHEN 325 MG PO TABS
650.0000 mg | ORAL_TABLET | Freq: Four times a day (QID) | ORAL | Status: DC | PRN
  Administered 2013-01-25 – 2013-01-27 (×5): 650 mg via ORAL
  Filled 2013-01-25 (×5): qty 2

## 2013-01-25 MED ORDER — HYDROCODONE-ACETAMINOPHEN 5-325 MG PO TABS
1.0000 | ORAL_TABLET | Freq: Four times a day (QID) | ORAL | Status: DC | PRN
  Administered 2013-01-25: 1 via ORAL
  Filled 2013-01-25: qty 1

## 2013-01-25 MED ORDER — ZINC SULFATE 220 (50 ZN) MG PO CAPS
220.0000 mg | ORAL_CAPSULE | Freq: Every morning | ORAL | Status: DC
  Administered 2013-01-25 – 2013-01-29 (×4): 220 mg via ORAL
  Filled 2013-01-25 (×5): qty 1

## 2013-01-25 MED ORDER — ONDANSETRON HCL 4 MG PO TABS: 4.0000 mg | ORAL_TABLET | Freq: Four times a day (QID) | ORAL | Status: DC | PRN

## 2013-01-25 MED ORDER — SODIUM CHLORIDE 0.9 % IJ SOLN
10.0000 mL | INTRAMUSCULAR | Status: DC | PRN
  Administered 2013-01-26 – 2013-02-01 (×10): 10 mL

## 2013-01-25 MED ORDER — AMLODIPINE BESYLATE 5 MG PO TABS
5.0000 mg | ORAL_TABLET | Freq: Every morning | ORAL | Status: DC
  Administered 2013-01-25 – 2013-01-29 (×4): 5 mg via ORAL
  Filled 2013-01-25 (×5): qty 1

## 2013-01-25 MED ORDER — FAMOTIDINE 20 MG PO TABS
20.0000 mg | ORAL_TABLET | Freq: Two times a day (BID) | ORAL | Status: DC
  Administered 2013-01-25 – 2013-01-29 (×7): 20 mg via ORAL
  Filled 2013-01-25 (×10): qty 1

## 2013-01-25 MED ORDER — METOCLOPRAMIDE HCL 10 MG PO TABS
10.0000 mg | ORAL_TABLET | Freq: Three times a day (TID) | ORAL | Status: DC
  Administered 2013-01-25 – 2013-01-29 (×11): 10 mg via ORAL
  Filled 2013-01-25 (×18): qty 1

## 2013-01-25 MED ORDER — VANCOMYCIN HCL 10 G IV SOLR
2000.0000 mg | Freq: Once | INTRAVENOUS | Status: AC
  Administered 2013-01-25: 2000 mg via INTRAVENOUS
  Filled 2013-01-25: qty 2000

## 2013-01-25 MED ORDER — ENSURE COMPLETE PO LIQD
237.0000 mL | Freq: Two times a day (BID) | ORAL | Status: DC
  Administered 2013-01-25 – 2013-01-29 (×6): 237 mL via ORAL

## 2013-01-25 MED ORDER — JUVEN PO PACK
1.0000 | PACK | Freq: Two times a day (BID) | ORAL | Status: DC
  Administered 2013-01-25 – 2013-01-29 (×7): 1 via ORAL
  Filled 2013-01-25 (×10): qty 1

## 2013-01-25 MED ORDER — FERROUS SULFATE 325 (65 FE) MG PO TABS
325.0000 mg | ORAL_TABLET | Freq: Three times a day (TID) | ORAL | Status: DC
  Administered 2013-01-25 – 2013-01-29 (×11): 325 mg via ORAL
  Filled 2013-01-25 (×16): qty 1

## 2013-01-25 MED ORDER — DIPHENHYDRAMINE HCL 50 MG/ML IJ SOLN
12.5000 mg | Freq: Once | INTRAMUSCULAR | Status: AC
  Administered 2013-01-25: 12.5 mg via INTRAVENOUS
  Filled 2013-01-25: qty 1

## 2013-01-25 MED ORDER — SODIUM CHLORIDE 0.9 % IJ SOLN
3.0000 mL | Freq: Two times a day (BID) | INTRAMUSCULAR | Status: DC
  Administered 2013-01-25 – 2013-02-01 (×5): 3 mL via INTRAVENOUS

## 2013-01-25 NOTE — Progress Notes (Signed)
Utilization review completed.  
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
## 2013-01-25 NOTE — Progress Notes (Signed)
MD had been text paged earlier regarding pt spiking temp at 101.7 to 102 after first unit of blood was started. MD called back & states will continue to transfuse blood at this time & he will put in order for pt. Awaiting for MD order at this time.
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
## 2013-01-25 NOTE — Progress Notes (Signed)
Placed on nocturnal CPAP 19cmH2O per home pressure with 3L supplemental O2 via large full face mask. Ramp start at [facility]. Patient confirms mask fit feels comfortable. Humidifier filled to max fill line with sterile water. VSS at this time.

## 2013-01-25 NOTE — Progress Notes (Signed)
Checked pt's vitals, pt had temperature of 102.1, paged MD on call. New orders received. Will continue to monitor pt.

## 2013-01-25 NOTE — Progress Notes (Signed)
[facility] PROGRESS NOTE  [name] [name] QMV:[id number] DOB: July 21, 1965 DOA: 01/24/2013 PCP: [name], MD  Assessment/Plan: 1-Fever: most likely UTI vs osteomyelitis. -Will continue invanz and vancomycin as recommended by ID -follow urine and blood cultures  -will repeat CT pelvis with contrast -C. Diff by PCR  2-Anemia: secondary to chronic blood loss from decubitus ulcer. -will transfuse and follow Hgb trend  3-hypokalemia and hyponatremia: will replete electrolytes -will check Mg and phosphorus levels  4-Decubitus ulcer stage 4 -will follow wound care service rec's -overlay mattress -IV antibiotics as per ID rec's -follow clinical response  5-hx of seizure disorder: stable. Continue home regimen  6-HTN: continue amlodipine  DVT: SCD's  Code Status: Full Family Communication: no family at bedside Disposition Plan: back to SNF when medically stable.   Consultants:  ID  Wound care consult  Procedures:  See below for x-ray reports  Antibiotics:  vanc  invanz   HPI/Subjective: AAOX3, spiking fever, no CP and no SOB. Hgb remains low.  Objective: Filed Vitals:   01/25/13 1600  BP: 118/97  Pulse: 96  Temp: 99.9 F (37.7 C)  Resp: 24    Intake/Output Summary (Last 24 hours) at 01/25/13 1816 Last data filed at 01/25/13 1613  Gross per 24 hour  Intake 2536.66 ml  Output   3000 ml  Net -463.34 ml   Filed Weights   01/25/13 1005  Weight: 104.781 kg (231 lb)    Exam:   General:  AAOX3, complaining of chills and warm to touch  Cardiovascular: mild tachycardia, no rubs or gallops  Respiratory: good air movement, no wheezing  Abdomen: soft, obese, positive BS, no tenderness; suprapubic catheter in place  Musculoskeletal: atrophy on LE bilaterally; also with decubitus ulcer  Data Reviewed: Basic Metabolic Panel:  Recent Labs Lab 01/24/13 2154 01/25/13 0800  NA 131* 127*  K 3.0* 3.1*  CL 96 93*  CO2 26 25  GLUCOSE 111* 184*   BUN 16 16  CREATININE 0.47* 0.50  CALCIUM 8.7 8.3*   CBC:  Recent Labs Lab 01/24/13 2154 01/25/13 0800  WBC 24.0* 26.9*  NEUTROABS 18.5*  --   HGB 6.6* 6.9*  HCT 22.3* 22.5*  MCV 70.8* 72.3*  PLT 684* 582*    Recent Results (from the past 240 hour(s))  URINE CULTURE     Status: None   Collection Time    01/24/13 10:13 PM      Result Value Range Status   Specimen Description URINE, CATHETERIZED   Final   Special Requests NONE   Final   Culture  Setup Time     Final   Value: 01/25/2013 02:30     Performed at [facility] Count     Final   Value: NO GROWTH     Performed at [facility]   Culture     Final   Value: CULTURE IN PROGRESS     Performed at [facility]   Report Status PENDING   Incomplete     Studies: Dg Chest Port 1 View  01/24/2013   CLINICAL DATA:  Fever.  EXAM: PORTABLE CHEST - 1 VIEW  COMPARISON:  11/07/2012.  FINDINGS: Stable enlargement of the cardiac silhouette. The lungs remain clear. Left PICC tip in the superior vena cava. Stable moderate dextroconvex thoracolumbar scoliosis and cervical spine fixation wires.  IMPRESSION: No acute abnormality. Stable cardiomegaly.   Electronically Signed   By: Gordan Payment   On: 01/24/2013 22:17    Scheduled  Meds: . sodium chloride   Intravenous STAT  . amLODipine  5 mg Oral q morning - 10a  . baclofen  20 mg Oral QID  . diphenhydrAMINE  12.5 mg Intravenous Once  . docusate sodium  100 mg Oral BID  . ertapenem (INVANZ) IV  1 g Intravenous Q24H  . famotidine  20 mg Oral BID  . feeding supplement  237 mL Oral BID BM  . ferrous sulfate  325 mg Oral TID WC  . furosemide  40 mg Intravenous Once  . furosemide  20 mg Oral q morning - 10a  . metoCLOPramide  10 mg Oral TID AC  . multivitamin with minerals  1 tablet Oral q morning - 10a  . nutrition supplement  1 packet Oral BID BM  . [START ON 01/26/2013] potassium chloride SA  40 mEq Oral q morning - 10a  . sodium chloride  3 mL  Intravenous Q12H  . vancomycin  1,000 mg Intravenous Q12H  . vitamin C  500 mg Oral q morning - 10a  . zinc sulfate  220 mg Oral q morning - 10a   Continuous Infusions:   Principal Problem:   Fever Active Problems:   Quadriplegia   Seizure disorder   Sacral decubitus ulcer, stage IV   Suprapubic catheter   Complicated UTI (urinary tract infection)   OSA on CPAP   Anemia   Chronic osteomyelitis, pelvic region and thigh    Time spent: >30 minutes   [name] [name]  [facility] Pager [phone]. If 7PM-7AM, please contact night-coverage at [email or web address], password [facility] 01/25/2013, 6:16 PM  LOS: 1 day

## 2013-01-25 NOTE — Consult Note (Signed)
WOC consult Note Reason for Consult: Chronic Stage IV pressure ulcers to sacrum and Buttocks. Stage III to left knee, all present on admission.  Wound type: Pressure ulcer Pressure Ulcer POA: Yes Measurement: Sacrum and Buttocks have 3 ulcers separated with narrow skin margins: 16 cm x 11 cm x 4.3 cm (sacral) 8 cm x 6 cm x 2 cm (medial) 12 cm x 8 cm x 4 cm (lateral) Left knee measures 5 cm x 4 cm x 0.2 cm Wound bed: Wound bed to sacral/buttock wounds are clean and red, visible muscle and tendon in wound bed. Bleeds easily with cleansing. Wound bed to left knee is clean, minimal serosanguinous drainage.  Drainage (amount, consistency, odor)  Sacral and buttock wounds have heavy purulent drainage with a musky odor.  Periwound: left knee periwound is intact.  Sacral and buttocks periwound is intact, but scattered areas of tape damage noted, in various stages of healing.  Dressing procedure/placement/frequency: Patient has been in a skilled facility receiving IV antibiotics.  Wounds are clean and draining heavily.  Patient has heavy perspiration and states this is normal for him.  Due to skin moisture, twice daily dressing changes should continue as adding an alginate would absorb wound drainage but most likely would become soiled and moist from perspiration, requiring additional dressing changes.  Cleanse all wounds with NS and pat gently dry.  Pack buttock and sacral wounds with NS moist gauze, place dry gauze on top and ABD pads on top of that.  Secure with Medfix tape. Change twice daily.  Left knee wound can be cleansed with NS and apply Allevyn silicone border foam.  Change every 3 days and PRN soilage and moisture.  Will not follow at this time.  Please re-consult if needed.  Maple Hudson RN BSN CWON Pager [phone]

## 2013-01-25 NOTE — Plan of Care (Signed)
Pt admitted with fever/UTI and progressive anemia- RN paged due to fever 102 during blood transfusion- o/w VSS- will give Benadryl x 1 but doubt transfusion rxn- will also give Toradol PO x 1 for fever.  Junious Silk, ANP

## 2013-01-25 NOTE — Progress Notes (Signed)
Clinical Social Work Department BRIEF PSYCHOSOCIAL ASSESSMENT 01/25/2013  Patient:  [name] [name],[name] [name]     Account Number:  [id number]     Admit date:  01/24/2013  Clinical Social Worker:  Candie Chroman  Date/Time:  01/25/2013 02:53 PM  Referred by:  CSW  Date Referred:  01/25/2013 Referred for  SNF Placement   Other Referral:   Interview type:  Patient Other interview type:    PSYCHOSOCIAL DATA Living Status:  FACILITY Admitted from facility:  [facility] Level of care:   Primary support name:  Bethann Berkshire Primary support relationship to patient:  FRIEND Degree of support available:   unclear    CURRENT CONCERNS Current Concerns  Post-Acute Placement   Other Concerns:    SOCIAL WORK ASSESSMENT / PLAN Pt is a 47 yr old gentleman admitted from [facility]. CSW met with pt to assist with d/c planning. Pt plans to return to [facility] following hospital d/c. SNF contacted and has confirmed d/c plan. Pt has [employer]. CSW will request prior auth to return to SNF, if required. CSW will continue to follow to assist with d/c planning to SNF.   Assessment/plan status:  Psychosocial Support/Ongoing Assessment of Needs Other assessment/ plan:   Information/referral to community resources:   None needed at this time.    PATIENT'S/FAMILY'S RESPONSE TO PLAN OF CARE: Pt is in agreement with plan to return to [facility] when stable.   Cori Razor LCSW [phone]

## 2013-01-25 NOTE — Plan of Care (Signed)
See epic progress notes for details. Patient fever unlikely to be related to transfusion as he was febrile before blood given. Will continue abx's and search for fever etiology. Tylenol/ibuprofen PRN fever.

## 2013-01-26 LAB — BASIC METABOLIC PANEL
CO2: 26 mEq/L (ref 19–32)
Chloride: 97 mEq/L (ref 96–112)
Creatinine, Ser: 0.45 mg/dL — ABNORMAL LOW (ref 0.50–1.35)
Glucose, Bld: 152 mg/dL — ABNORMAL HIGH (ref 70–99)

## 2013-01-26 LAB — TYPE AND SCREEN
Unit division: 0
Unit division: 0

## 2013-01-26 LAB — URINE CULTURE

## 2013-01-26 LAB — MAGNESIUM: Magnesium: 1.8 mg/dL (ref 1.5–2.5)

## 2013-01-26 LAB — CBC
HCT: 28.4 % — ABNORMAL LOW (ref 39.0–52.0)
Hemoglobin: 8.9 g/dL — ABNORMAL LOW (ref 13.0–17.0)
MCH: 23.6 pg — ABNORMAL LOW (ref 26.0–34.0)
MCV: 75.3 fL — ABNORMAL LOW (ref 78.0–100.0)
Platelets: 572 10*3/uL — ABNORMAL HIGH (ref 150–400)
RBC: 3.77 MIL/uL — ABNORMAL LOW (ref 4.22–5.81)
WBC: 19.4 10*3/uL — ABNORMAL HIGH (ref 4.0–10.5)

## 2013-01-26 MED ORDER — DEXTROSE 5 % IV SOLN
2.0000 g | Freq: Three times a day (TID) | INTRAVENOUS | Status: DC
  Administered 2013-01-26 – 2013-02-01 (×19): 2 g via INTRAVENOUS
  Filled 2013-01-26 (×20): qty 2

## 2013-01-26 NOTE — Progress Notes (Signed)
Lab unable to draw blood cultures despite 3 persons attempting and obtaining foot stick order. Dr Gwenlyn Perking notified. Dr Gwenlyn Perking would like Lab to try again in am. Raynelle Fanning in lab notified.
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
## 2013-01-26 NOTE — Progress Notes (Signed)
Dr Gwenlyn Perking notified of temp 101.3

## 2013-01-26 NOTE — Progress Notes (Signed)
[name] PROGRESS NOTE  Mukund Weinreb ZOX:[id number] DOB: [birth year] DOA: 01/24/2013 PCP: [name], MD  Assessment/Plan: 1-Fever: most likely UTI vs right hip osteomyelitis/septic arthritis. -Will continue invanz and vancomycin as recommended by ID -urine demonstrating pseudomonas 15,000 colonies, sensitivity pending and blood cultures has not been able to be taken (patient with thin veins); ??? Perhaps ID will like to get [facility] from central line -CT pelvis findings demonstrating healed osteomyelitis and concerns for right hip septic arthritis (has had abnormalities like this since March and multiple admissions here and at [facility] with prolong antibiotic therapy. Most recent finished on Sep 1) -C. Diff by PCR negative -fever curve and WBC's improving  2-Anemia: secondary to chronic blood loss from decubitus ulcer. -Hgb 8.9 after 3 units of PRBC's -will follow Hgb trend  3-hypokalemia and hyponatremia:  -repleted. -will monitor and continue repletion as needed  4-Decubitus ulcer stage 4 -will follow wound care service rec's -overlay mattress -IV antibiotics as per ID rec's -follow clinical response  5-hx of seizure disorder: stable. Continue home regimen  6-HTN: continue amlodipine. Stable  7-OSA: continue CPAP  DVT: SCD's  Code Status: Full Family Communication: no family at bedside Disposition Plan: back to SNF when medically stable.   Consultants:  ID  Wound care consult  Procedures:  See below for x-ray reports  Antibiotics:  vanc  invanz   HPI/Subjective: AAOX3, low grade fever, no CP and no SOB. Hgb 8.9 after 3 units given.  Objective: Filed Vitals:   01/26/13 1435  BP: 94/52  Pulse: 101  Temp: 100.3 F (37.9 C)  Resp: 18    Intake/Output Summary (Last 24 hours) at 01/26/13 1659 Last data filed at 01/26/13 1435  Gross per 24 hour  Intake 1646.67 ml  Output   6000 ml  Net -4353.33 ml   Filed Weights   01/25/13 1005  Weight:  104.781 kg (231 lb)    Exam:   General:  AAOX3, no acute distress; low grade fever today (Max 100.3)  Cardiovascular: mild tachycardia, no rubs or gallops  Respiratory: good air movement, no wheezing  Abdomen: soft, obese, positive BS, no tenderness; suprapubic catheter in place  Musculoskeletal: atrophy on LE bilaterally; also with decubitus ulcer  Data Reviewed: Basic Metabolic Panel:  Recent Labs Lab 01/24/13 2154 01/25/13 0800 01/26/13 0510  NA 131* 127* 131*  K 3.0* 3.1* 3.6  CL 96 93* 97  CO2 26 25 26   GLUCOSE 111* 184* 152*  BUN 16 16 17   CREATININE 0.47* 0.50 0.45*  CALCIUM 8.7 8.3* 9.0  MG  --   --  1.8  PHOS  --   --  2.8   CBC:  Recent Labs Lab 01/24/13 2154 01/25/13 0800 01/26/13 0510  WBC 24.0* 26.9* 19.4*  NEUTROABS 18.5*  --   --   HGB 6.6* 6.9* 8.9*  HCT 22.3* 22.5* 28.4*  MCV 70.8* 72.3* 75.3*  PLT 684* 582* 572*    Recent Results (from the past 240 hour(s))  URINE CULTURE     Status: None   Collection Time    01/24/13 10:13 PM      Result Value Range Status   Specimen Description URINE, CATHETERIZED   Final   Special Requests NONE   Final   Culture  Setup Time     Final   Value: 01/25/2013 02:30     Performed at [facility] Count     Final   Value: 15,000 COLONIES/ML     Performed  at [facility]   Culture     Final   Value: PSEUDOMONAS AERUGINOSA     Performed at [facility]   Report Status 01/26/2013 FINAL   Final   Organism ID, Bacteria PSEUDOMONAS AERUGINOSA   Final  URINE CULTURE     Status: None   Collection Time    01/25/13  5:51 AM      Result Value Range Status   Specimen Description URINE, CLEAN CATCH   Final   Special Requests Normal   Final   Culture  Setup Time     Final   Value: 01/25/2013 09:43     Performed at [facility] Count     Final   Value: 80,000 COLONIES/ML     Performed at [facility]   Culture     Final   Value: Multiple bacterial  morphotypes present, none predominant. Suggest appropriate recollection if clinically indicated.     Performed at [facility]   Report Status 01/26/2013 FINAL   Final  CLOSTRIDIUM DIFFICILE BY PCR     Status: None   Collection Time    01/26/13  1:45 AM      Result Value Range Status   C difficile by pcr NEGATIVE  NEGATIVE Final   Comment: Performed at [facility]     Studies: Ct Pelvis W Contrast  01/25/2013   CLINICAL DATA:  47 year old male with sacral wound and pelvic osteomyelitis.  EXAM: CT PELVIS WITH CONTRAST  TECHNIQUE: Multidetector CT imaging of the pelvis was performed using the standard protocol following the bolus administration of intravenous contrast.  CONTRAST:  OMNIPAQUE IOHEXOL 300 MG/ML  SOLN  COMPARISON:  08/02/12.  FINDINGS: Large body habitus. Severe soft tissue deficiency along the lower sacrum and coccyx has increased, but associated stranding and soft tissue thickening appears diminished. Severe soft tissue deficiency along the bilateral ischia appears not significantly changed. Severe soft tissue wound involving the right hip with abundant intra-articular gas, and destruction of the proximal right femur and acetabulum. Sclerosis of the remaining bones and bone fragments at this site without definite interval osseous destruction.  Chronic unusual focus of myositis ossifications or heterotopic ossification tracking into the ventral right thigh and contiguous with bone at the pubic symphysis incidentally re- identified.  The left hip and proximal femur are not as well visualized today due to large body habitus, but chronically abnormal left acetabulum and proximal femur appear to be stable.  Suprapubic catheter re- identified. No pelvic or lower abdominal free fluid. Questionable trace free fluid versus motion artifact at the tip of the liver. Left lower quadrant ostomy. No dilated bowel loops. Intact visualized lumbar spine.  IMPRESSION: 1. Sequelae of  severe soft tissue and bony infection along the sacrum, both ischia, and at the right hip. Appearance now most compatible with healed osteomyelitis and septic right hip arthritis. No definite interval bone destruction.  2. Partially visible abnormal left hip also appears stable.  3. No new abnormality identified.   Electronically Signed   By: Augusto Gamble M.D.   On: 01/25/2013 19:47   Dg Chest Port 1 View  01/24/2013   CLINICAL DATA:  Fever.  EXAM: PORTABLE CHEST - 1 VIEW  COMPARISON:  11/07/2012.  FINDINGS: Stable enlargement of the cardiac silhouette. The lungs remain clear. Left PICC tip in the superior vena cava. Stable moderate dextroconvex thoracolumbar scoliosis and cervical spine fixation wires.  IMPRESSION: No acute abnormality. Stable cardiomegaly.  Electronically Signed   By: Gordan Payment   On: 01/24/2013 22:17    Scheduled Meds: . amLODipine  5 mg Oral q morning - 10a  . baclofen  20 mg Oral QID  . cefTAZidime (FORTAZ)  IV  2 g Intravenous Q8H  . docusate sodium  100 mg Oral BID  . famotidine  20 mg Oral BID  . feeding supplement  237 mL Oral BID BM  . ferrous sulfate  325 mg Oral TID WC  . furosemide  20 mg Oral q morning - 10a  . metoCLOPramide  10 mg Oral TID AC  . multivitamin with minerals  1 tablet Oral q morning - 10a  . nutrition supplement  1 packet Oral BID BM  . potassium chloride SA  40 mEq Oral q morning - 10a  . sodium chloride  3 mL Intravenous Q12H  . vancomycin  1,000 mg Intravenous Q12H  . vitamin C  500 mg Oral q morning - 10a  . zinc sulfate  220 mg Oral q morning - 10a   Continuous Infusions: . sodium chloride 50 mL/hr (01/26/13 0900)    Principal Problem:   Fever Active Problems:   Quadriplegia   Seizure disorder   Sacral decubitus ulcer, stage IV   Suprapubic catheter   Complicated UTI (urinary tract infection)   OSA on CPAP   Anemia   Chronic osteomyelitis, pelvic region and thigh    Time spent: >30 minutes   Kylen Ismael  [name] Pager [phone]. If 7PM-7AM, please contact night-coverage at [email or web address], password [facility] 01/26/2013, 4:59 PM  LOS: 2 days
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
## 2013-01-27 ENCOUNTER — Inpatient Hospital Stay (HOSPITAL_COMMUNITY): Payer: Medicare Other

## 2013-01-27 DIAGNOSIS — D72829 Elevated white blood cell count, unspecified: Secondary | ICD-10-CM

## 2013-01-27 MED ORDER — ONDANSETRON HCL 4 MG PO TABS
4.0000 mg | ORAL_TABLET | ORAL | Status: DC | PRN
  Filled 2013-01-27: qty 1

## 2013-01-27 MED ORDER — PROMETHAZINE HCL 25 MG/ML IJ SOLN
12.5000 mg | Freq: Four times a day (QID) | INTRAMUSCULAR | Status: DC | PRN
  Administered 2013-01-27 – 2013-01-28 (×4): 12.5 mg via INTRAVENOUS
  Filled 2013-01-27 (×4): qty 1

## 2013-01-27 MED ORDER — ONDANSETRON HCL 4 MG/2ML IJ SOLN
4.0000 mg | INTRAMUSCULAR | Status: DC | PRN
  Administered 2013-01-28 – 2013-01-29 (×5): 4 mg via INTRAVENOUS
  Filled 2013-01-27 (×4): qty 2

## 2013-01-27 MED ORDER — SODIUM CHLORIDE 0.9 % IJ SOLN
INTRAMUSCULAR | Status: AC
  Administered 2013-01-27: 23:00:00
  Filled 2013-01-27: qty 10
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
## 2013-01-27 NOTE — Progress Notes (Signed)
Spoke with Dr. Gwenlyn Perking via telephone regarding Orders to remove PICC placed by IR.  IR unable to replace picc UNTIL Monday.  States not to remove current PICC unless peripheral access obtained and IR would be consulted Monday due to need for IV antibiotic therapy.  Assessed pt for PIV by 2 IVT RN's- self and Lioba Hanolt RN- unable to find peripheral access.  Will leave PICC in per conversation with MD.  [name] Freestone RN aware.

## 2013-01-27 NOTE — Progress Notes (Signed)
Unable to visualize veins for PIV, poor veins.Primary RN aware. Chong Sicilian, IV nurse

## 2013-01-27 NOTE — Progress Notes (Signed)
[facility] PROGRESS NOTE  [name] [name] ZOX:[id number] DOB: [birth year] DOA: 01/24/2013 PCP: [name], MD  Assessment/Plan: 1-Fever: most likely UTI vs right hip osteomyelitis/septic arthritis. -Will continue invanz and vancomycin as recommended by ID -urine demonstrating pseudomonas 15,000 colonies, sensitivity pending and blood cultures has not been able to be taken (patient with thin veins); ??? Perhaps ID will like to get [facility] from central line -CT pelvis findings demonstrating healed osteomyelitis and concerns for right hip septic arthritis (has had abnormalities like this since March and multiple admissions here and at [facility] with prolong antibiotic therapy. Most recent finished on Sep 1) -C. Diff by PCR negative -fever curve and WBC's improving -will discontinue PICC line and check picc tip cx -will ask IR for potential bone aspiration to help guiding abx therapy.  2-Anemia: secondary to chronic blood loss from decubitus ulcer. -Hgb 8.9 after 3 units of PRBC's -will follow Hgb trend  3-hypokalemia and hyponatremia:  -repleted. -will monitor and continue repletion as needed  4-Decubitus ulcer stage 4 and ??right septic arthritis -will follow wound care service rec's -overlay mattress -IV antibiotics as per ID rec's -follow clinical response -will ask IR for potential bone aspiration  5-hx of seizure disorder: stable. Continue home regimen  6-HTN: continue amlodipine. Stable  7-OSA: continue CPAP  DVT: SCD's  Code Status: Full Family Communication: no family at bedside Disposition Plan: back to SNF when medically stable.   Consultants:  ID  Wound care consult  Procedures:  See below for x-ray reports  Antibiotics:  vanc  invanz   HPI/Subjective: AAOX3,continue spiking fever, no CP and no SOB.  Objective: Filed Vitals:   01/27/13 1806  BP: 117/70  Pulse: 113  Temp: 100.1 F (37.8 C)  Resp: 16    Intake/Output Summary (Last 24 hours)  at 01/27/13 2151 Last data filed at 01/27/13 1800  Gross per 24 hour  Intake 2406.67 ml  Output   6314 ml  Net -3907.33 ml   Filed Weights   01/25/13 1005  Weight: 104.781 kg (231 lb)    Exam:   General:  AAOX3, no acute distress; continue spiking fever  Max temp today 101.3  Cardiovascular: mild tachycardia, no rubs or gallops  Respiratory: good air movement, no wheezing  Abdomen: soft, obese, positive BS, no tenderness; suprapubic catheter and colostomy in place  Musculoskeletal: atrophy on LE bilaterally; also with decubitus ulcer  Data Reviewed: Basic Metabolic Panel:  Recent Labs Lab 01/24/13 2154 01/25/13 0800 01/26/13 0510  NA 131* 127* 131*  K 3.0* 3.1* 3.6  CL 96 93* 97  CO2 26 25 26   GLUCOSE 111* 184* 152*  BUN 16 16 17   CREATININE 0.47* 0.50 0.45*  CALCIUM 8.7 8.3* 9.0  MG  --   --  1.8  PHOS  --   --  2.8   CBC:  Recent Labs Lab 01/24/13 2154 01/25/13 0800 01/26/13 0510  WBC 24.0* 26.9* 19.4*  NEUTROABS 18.5*  --   --   HGB 6.6* 6.9* 8.9*  HCT 22.3* 22.5* 28.4*  MCV 70.8* 72.3* 75.3*  PLT 684* 582* 572*    Recent Results (from the past 240 hour(s))  URINE CULTURE     Status: None   Collection Time    01/24/13 10:13 PM      Result Value Range Status   Specimen Description URINE, CATHETERIZED   Final   Special Requests NONE   Final   Culture  Setup Time     Final   Value: 01/25/2013  02:30     Performed at [facility] Count     Final   Value: 15,000 COLONIES/ML     Performed at [facility]   Culture     Final   Value: PSEUDOMONAS AERUGINOSA     Performed at [facility]   Report Status 01/26/2013 FINAL   Final   Organism ID, Bacteria PSEUDOMONAS AERUGINOSA   Final  URINE CULTURE     Status: None   Collection Time    01/25/13  5:51 AM      Result Value Range Status   Specimen Description URINE, CLEAN CATCH   Final   Special Requests Normal   Final   Culture  Setup Time     Final   Value:  01/25/2013 09:43     Performed at [facility] Count     Final   Value: 80,000 COLONIES/ML     Performed at [facility]   Culture     Final   Value: Multiple bacterial morphotypes present, none predominant. Suggest appropriate recollection if clinically indicated.     Performed at [facility]   Report Status 01/26/2013 FINAL   Final  CLOSTRIDIUM DIFFICILE BY PCR     Status: None   Collection Time    01/26/13  1:45 AM      Result Value Range Status   C difficile by pcr NEGATIVE  NEGATIVE Final   Comment: Performed at [facility]     Studies: Dg Chest Port 1 View  01/27/2013   CLINICAL DATA:  Congestion. Possible aspiration.  EXAM: PORTABLE CHEST - 1 VIEW  COMPARISON:  CHEST x-ray 01/24/2013.  FINDINGS: There is a left upper extremity PICC with tip terminating in the mid superior vena cava. Lung volumes are low, and there are bibasilar opacities (right greater than left), which may reflect areas of atelectasis and or consolidation. Increasing moderate right and small left pleural effusions. Cephalization of the pulmonary vasculature. Heart size appears borderline to mildly enlarged, likely accentuated by low lung volumes, portable technique and leftward patient rotation. Mediastinal contours are also distorted. Irregular opacity in the periphery of the right upper lobe is similar to numerous prior examinations, favored to represent some chronic post infectious scarring. There may be some calcified granulomas in this region.  IMPRESSION: 1. Support apparatus, as above. 2. Worsening aeration, particularly at the right base, concerning for increasing atelectasis and or consolidation with increasing moderate right and small left pleural effusions.   Electronically Signed   By: Trudie Reed M.D.   On: 01/27/2013 20:45    Scheduled Meds: . amLODipine  5 mg Oral q morning - 10a  . baclofen  20 mg Oral QID  . cefTAZidime (FORTAZ)  IV  2 g Intravenous Q8H   . docusate sodium  100 mg Oral BID  . famotidine  20 mg Oral BID  . feeding supplement  237 mL Oral BID BM  . ferrous sulfate  325 mg Oral TID WC  . furosemide  20 mg Oral q morning - 10a  . metoCLOPramide  10 mg Oral TID AC  . multivitamin with minerals  1 tablet Oral q morning - 10a  . nutrition supplement  1 packet Oral BID BM  . potassium chloride SA  40 mEq Oral q morning - 10a  . sodium chloride  3 mL Intravenous Q12H  . vancomycin  1,000 mg Intravenous Q12H  . vitamin C  500 mg Oral q morning - 10a  . zinc sulfate  220 mg Oral q morning - 10a   Continuous Infusions: . sodium chloride 20 mL/hr at 01/27/13 2052    Principal Problem:   Fever Active Problems:   Quadriplegia   Seizure disorder   Sacral decubitus ulcer, stage IV   Suprapubic catheter   Complicated UTI (urinary tract infection)   OSA on CPAP   Anemia   Chronic osteomyelitis, pelvic region and thigh   Time spent: >30 minutes   Elvina Bosch  [facility] Pager [phone]. If 7PM-7AM, please contact night-coverage at [email or web address], password [facility] 01/27/2013, 9:51 PM  LOS: 3 days

## 2013-01-28 ENCOUNTER — Inpatient Hospital Stay (HOSPITAL_COMMUNITY): Payer: Medicare Other

## 2013-01-28 LAB — CBC WITH DIFFERENTIAL/PLATELET
Basophils Absolute: 0 10*3/uL (ref 0.0–0.1)
Basophils Relative: 0 % (ref 0–1)
Hemoglobin: 8.8 g/dL — ABNORMAL LOW (ref 13.0–17.0)
MCHC: 30.8 g/dL (ref 30.0–36.0)
Monocytes Relative: 8 % (ref 3–12)
Neutro Abs: 19 10*3/uL — ABNORMAL HIGH (ref 1.7–7.7)
Neutrophils Relative %: 83 % — ABNORMAL HIGH (ref 43–77)
RDW: 19.2 % — ABNORMAL HIGH (ref 11.5–15.5)

## 2013-01-28 LAB — VANCOMYCIN, TROUGH: Vancomycin Tr: 11.9 ug/mL (ref 10.0–20.0)

## 2013-01-28 LAB — BASIC METABOLIC PANEL
Chloride: 100 mEq/L (ref 96–112)
GFR calc Af Amer: >90 mL/min (ref >90)
Potassium: 3.7 mEq/L (ref 3.5–5.1)

## 2013-01-28 MED ORDER — VANCOMYCIN HCL 10 G IV SOLR
1250.0000 mg | Freq: Two times a day (BID) | INTRAVENOUS | Status: DC
  Administered 2013-01-28 – 2013-01-30 (×5): 1250 mg via INTRAVENOUS
  Filled 2013-01-28 (×6): qty 1250

## 2013-01-28 MED ORDER — SIMETHICONE 80 MG PO CHEW
80.0000 mg | CHEWABLE_TABLET | Freq: Four times a day (QID) | ORAL | Status: DC | PRN
  Filled 2013-01-28: qty 1

## 2013-01-28 NOTE — Consult Note (Signed)
WOC consult Note Reason for Consult: Request from patient and staff for bariatric bed with air loss feature.  Order placed. Jacki Cones

## 2013-01-28 NOTE — Progress Notes (Signed)
Notified MD Gwenlyn Perking regarding patient with nausea and vomiting, no relief from zofran, MD ordering a KUB Stanford Breed RN 01-28-2013 10:46am

## 2013-01-28 NOTE — Progress Notes (Signed)
Paged IV team regarding removal of picc line and placement of another picc line Stanford Breed RN 01-28-2013 12:49pm
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
## 2013-01-28 NOTE — Progress Notes (Signed)
Patient ID: [name] [name], male   DOB: 18-May-1965, 47 y.o.   MRN: [id number]         [facility]    Date of Admission:  01/24/2013   Total days of antibiotics 4        Day 4 vancomycin        Day 3 ceftazadime         Principal Problem:   Fever Active Problems:   Sacral decubitus ulcer, stage IV   Quadriplegia   Seizure disorder   Suprapubic catheter   Complicated UTI (urinary tract infection)   OSA on CPAP   Anemia   Chronic osteomyelitis, pelvic region and thigh   . amLODipine  5 mg Oral q morning - 10a  . baclofen  20 mg Oral QID  . cefTAZidime (FORTAZ)  IV  2 g Intravenous Q8H  . docusate sodium  100 mg Oral BID  . famotidine  20 mg Oral BID  . feeding supplement  237 mL Oral BID BM  . ferrous sulfate  325 mg Oral TID WC  . furosemide  20 mg Oral q morning - 10a  . metoCLOPramide  10 mg Oral TID AC  . multivitamin with minerals  1 tablet Oral q morning - 10a  . nutrition supplement  1 packet Oral BID BM  . potassium chloride SA  40 mEq Oral q morning - 10a  . sodium chloride  3 mL Intravenous Q12H  . vancomycin  1,250 mg Intravenous Q12H  . vitamin C  500 mg Oral q morning - 10a  . zinc sulfate  220 mg Oral q morning - 10a    Objective: Temp:  [98.8 F (37.1 C)-100.1 F (37.8 C)] 98.8 F (37.1 C) (09/15 0654) Pulse Rate:  [105-117] 117 (09/15 0654) Resp:  [16-18] 18 (09/15 0654) BP: (117-184)/(70-96) 128/96 mmHg (09/15 0654) SpO2:  [99 %-100 %] 99 % (09/15 0654)  He is currently out of his room for a KUB  Lab Results Lab Results  Component Value Date   WBC 22.8* 01/28/2013   HGB 8.8* 01/28/2013   HCT 28.6* 01/28/2013   MCV 76.1* 01/28/2013   PLT 599* 01/28/2013    Lab Results  Component Value Date   CREATININE 0.46* 01/28/2013   BUN 19 01/28/2013   NA 136 01/28/2013   K 3.7 01/28/2013   CL 100 01/28/2013   CO2 31 01/28/2013    Lab Results  Component Value Date   ALT 30 09/05/2012   AST 27 09/05/2012   ALKPHOS 102 09/05/2012   BILITOT 0.1* 09/05/2012      Microbiology: Recent Results (from the past 240 hour(s))  URINE CULTURE     Status: None   Collection Time    01/24/13 10:13 PM      Result Value Range Status   Specimen Description URINE, CATHETERIZED   Final   Special Requests NONE   Final   Culture  Setup Time     Final   Value: 01/25/2013 02:30     Performed at [facility] Count     Final   Value: 15,000 COLONIES/ML     Performed at [facility]   Culture     Final   Value: PSEUDOMONAS AERUGINOSA     Performed at [facility]   Report Status 01/26/2013 FINAL   Final   Organism ID, Bacteria PSEUDOMONAS AERUGINOSA   Final  URINE CULTURE     Status: None  Collection Time    01/25/13  5:51 AM      Result Value Range Status   Specimen Description URINE, CLEAN CATCH   Final   Special Requests Normal   Final   Culture  Setup Time     Final   Value: 01/25/2013 09:43     Performed at [facility] Count     Final   Value: 80,000 COLONIES/ML     Performed at [facility]   Culture     Final   Value: Multiple bacterial morphotypes present, none predominant. Suggest appropriate recollection if clinically indicated.     Performed at [facility]   Report Status 01/26/2013 FINAL   Final  CLOSTRIDIUM DIFFICILE BY PCR     Status: None   Collection Time    01/26/13  1:45 AM      Result Value Range Status   C difficile by pcr NEGATIVE  NEGATIVE Final   Comment: Performed at [facility]  CULTURE, BLOOD (ROUTINE X 2)     Status: None   Collection Time    01/27/13  5:30 AM      Result Value Range Status   Specimen Description BLOOD RIGHT ARM   Final   Special Requests     Final   Value: BOTTLES DRAWN AEROBIC AND ANAEROBIC 3 CC BLUE,2 CC RED   Culture  Setup Time     Final   Value: 01/27/2013 15:09     Performed at [facility]   Culture     Final   Value:        BLOOD CULTURE RECEIVED NO GROWTH TO DATE CULTURE WILL BE  HELD FOR 5 DAYS BEFORE ISSUING A FINAL NEGATIVE REPORT     Performed at [facility]   Report Status PENDING   Incomplete  CULTURE, BLOOD (ROUTINE X 2)     Status: None   Collection Time    01/27/13  5:35 AM      Result Value Range Status   Specimen Description BLOOD RIGHT HAND   Final   Special Requests BOTTLES DRAWN AEROBIC AND ANAEROBIC 2 CC EA   Final   Culture  Setup Time     Final   Value: 01/27/2013 14:58     Performed at [facility]   Culture     Final   Value:        BLOOD CULTURE RECEIVED NO GROWTH TO DATE CULTURE WILL BE HELD FOR 5 DAYS BEFORE ISSUING A FINAL NEGATIVE REPORT     Performed at [facility]   Report Status PENDING   Incomplete    Assessment: He is defervescing on his current antibiotic therapy. His CT scan shows some intra-articular gets in the right hip. I agree with asking interventional radiology to aspirate the hip incision specimens for Gram stain, aerobic and anaerobic cultures. In addition to his hip infection he may also have symptomatic Pseudomonas urinary tract infection.  Plan: 1. Continue current antibiotics 2. Hip aspirate  Cliffton Asters, MD [facility] [phone] pager   [phone] cell 01/28/2013, 2:49 PM

## 2013-01-28 NOTE — Progress Notes (Signed)
Pt has become very congested.  Pt stated that it just started 30 min ago after supper. Pt has no SOB. MD notified of new onset congestion. Chest xray ordered. Patsey Berthold

## 2013-01-28 NOTE — Care Management Note (Signed)
    Page 1 of 1   01/28/2013     11:13:27 AM   CARE MANAGEMENT NOTE 01/28/2013  Patient:  [name],[name]   Account Number:  [id number]  Date Initiated:  01/28/2013  Documentation initiated by:  Lorenda Ishihara  Subjective/Objective Assessment:   47 yo male admitted with fever related to septic arthritis and UTI. PTA lived at SNF[facility].     Action/Plan:   Return to [facility] when stable   Anticipated DC Date:  01/28/2013   Anticipated DC Plan:  SKILLED NURSING FACILITY  In-house referral  Clinical Social Worker      DC Planning Services  CM consult      Choice offered to / List presented to:             Status of service:  Completed, signed off Medicare Important Message given?   (If response is "NO", the following Medicare IM given date fields will be blank) Date Medicare IM given:   Date Additional Medicare IM given:    Discharge Disposition:  SKILLED NURSING FACILITY  Per UR Regulation:  Reviewed for med. necessity/level of care/duration of stay  If discussed at Long Length of Stay Meetings, dates discussed:    Comments:

## 2013-01-28 NOTE — Progress Notes (Signed)
PHARMACY - VANCOMYCIN (brief note)  Vancomycin trough = 11.9 mcg/ml on regimen of Vancomycin 1gm IV q12h (goal 15-20 mcg/ml) for treatment of osteomyelitis.   Vancomycin 1gm dose hung @ 02:13 after lab was drawn; therefore will adjust regimen with next dose.  PLAN:  Increase Vancomycin to 1250mg  IV q12h  Terrilee Files, PharmD 01/28/13 @ 02:30

## 2013-01-29 DIAGNOSIS — B965 Pseudomonas (aeruginosa) (mallei) (pseudomallei) as the cause of diseases classified elsewhere: Secondary | ICD-10-CM

## 2013-01-29 DIAGNOSIS — R112 Nausea with vomiting, unspecified: Secondary | ICD-10-CM

## 2013-01-29 MED ORDER — METOCLOPRAMIDE HCL 5 MG/ML IJ SOLN
5.0000 mg | Freq: Three times a day (TID) | INTRAMUSCULAR | Status: DC
  Administered 2013-01-29 – 2013-02-01 (×10): 5 mg via INTRAVENOUS
  Filled 2013-01-29: qty 1
  Filled 2013-01-29 (×3): qty 2
  Filled 2013-01-29 (×4): qty 1
  Filled 2013-01-29 (×2): qty 2
  Filled 2013-01-29 (×2): qty 1
  Filled 2013-01-29: qty 2
  Filled 2013-01-29 (×2): qty 1
  Filled 2013-01-29: qty 2

## 2013-01-29 MED ORDER — FUROSEMIDE 10 MG/ML IJ SOLN
20.0000 mg | Freq: Every day | INTRAMUSCULAR | Status: DC
  Administered 2013-01-29 – 2013-02-01 (×4): 20 mg via INTRAVENOUS
  Filled 2013-01-29 (×4): qty 2

## 2013-01-29 MED ORDER — PROMETHAZINE HCL 25 MG/ML IJ SOLN
25.0000 mg | Freq: Once | INTRAMUSCULAR | Status: AC
  Administered 2013-01-29: 25 mg via INTRAVENOUS
  Filled 2013-01-29: qty 1

## 2013-01-29 MED ORDER — PANTOPRAZOLE SODIUM 40 MG IV SOLR
40.0000 mg | INTRAVENOUS | Status: DC
  Administered 2013-01-29 – 2013-02-01 (×4): 40 mg via INTRAVENOUS
  Filled 2013-01-29 (×5): qty 40

## 2013-01-29 MED ORDER — POTASSIUM CHLORIDE 2 MEQ/ML IV SOLN
INTRAVENOUS | Status: DC
  Administered 2013-01-29 – 2013-02-01 (×4): via INTRAVENOUS
  Filled 2013-01-29 (×6): qty 1000

## 2013-01-29 NOTE — Progress Notes (Signed)
Patient has NG tube at this time and does not wish to use CPAP.
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
## 2013-01-29 NOTE — Progress Notes (Signed)
CSW continues to follow to assist with d/c planning. Pt will return to [facility] when stable for d/c.   Cori Razor LCSW [phone]

## 2013-01-29 NOTE — Progress Notes (Signed)
Pt vomited 400ccs.  Paged hospitalist on call.  Order for 25mg  Phenergan IV now and to place an NG tube.  Spoke to pt, he stated that he has had NG tube before, understands why he is getting NG tube, and agrees to get another one place.  88F NG tube placed.  450ccs of brown/red NG tube output immediately.  Placed on low-intermittent wall suction.  Pt feels relief from nausea.  Will continue to monitor.  [name] [name]

## 2013-01-29 NOTE — Progress Notes (Signed)
Patient ID: [name] [name], male   DOB: 09-06-1965, 47 y.o.   MRN: [id number]         [name]    Date of Admission:  01/24/2013           Day 5 vancomycin        Day 4ceftaz edema  Principal Problem:   Fever Active Problems:   Sacral decubitus ulcer, stage IV   Quadriplegia   Seizure disorder   Suprapubic catheter   Complicated UTI (urinary tract infection)   OSA on CPAP   Anemia   Chronic osteomyelitis, pelvic region and thigh   . cefTAZidime (FORTAZ)  IV  2 g Intravenous Q8H  . furosemide  20 mg Intravenous Daily  . metoCLOPramide (REGLAN) injection  5 mg Intravenous Q8H  . pantoprazole (PROTONIX) IV  40 mg Intravenous Q24H  . sodium chloride  3 mL Intravenous Q12H  . vancomycin  1,250 mg Intravenous Q12H    Subjective: He developed nausea and vomiting last night and had an NG tube placed. He is feeling better today.  Objective: Temp:  [98.3 F (36.8 C)-98.6 F (37 C)] 98.6 F (37 C) (09/16 0603) Pulse Rate:  [97-116] 116 (09/16 1015) Resp:  [18] 18 (09/16 0603) BP: (100-119)/(67-77) 119/77 mmHg (09/16 1015) SpO2:  [94 %-95 %] 94 % (09/16 0603)  General: he is alert and comfortable watching television Skin: PICC site normal Lungs: clear Cor: regular S1 and S2 no murmurs Abdomen: less distended  Lab Results Lab Results  Component Value Date   WBC 22.8* 01/28/2013   HGB 8.8* 01/28/2013   HCT 28.6* 01/28/2013   MCV 76.1* 01/28/2013   PLT 599* 01/28/2013    Lab Results  Component Value Date   CREATININE 0.46* 01/28/2013   BUN 19 01/28/2013   NA 136 01/28/2013   K 3.7 01/28/2013   CL 100 01/28/2013   CO2 31 01/28/2013    Lab Results  Component Value Date   ALT 30 09/05/2012   AST 27 09/05/2012   ALKPHOS 102 09/05/2012   BILITOT 0.1* 09/05/2012      Microbiology: Recent Results (from the past 240 hour(s))  URINE CULTURE     Status: None   Collection Time    01/24/13 10:13 PM      Result Value Range Status   Specimen Description  URINE, CATHETERIZED   Final   Special Requests NONE   Final   Culture  Setup Time     Final   Value: 01/25/2013 02:30     Performed at [facility] Count     Final   Value: 15,000 COLONIES/ML     Performed at [facility]   Culture     Final   Value: PSEUDOMONAS AERUGINOSA     Performed at [facility]   Report Status 01/26/2013 FINAL   Final   Organism ID, Bacteria PSEUDOMONAS AERUGINOSA   Final  URINE CULTURE     Status: None   Collection Time    01/25/13  5:51 AM      Result Value Range Status   Specimen Description URINE, CLEAN CATCH   Final   Special Requests Normal   Final   Culture  Setup Time     Final   Value: 01/25/2013 09:43     Performed at [facility] Count     Final   Value: 80,000 COLONIES/ML     Performed at [facility]   Culture     Final   Value: Multiple bacterial morphotypes present, none predominant. Suggest appropriate recollection if clinically indicated.     Performed at [facility]   Report Status 01/26/2013 FINAL   Final  CLOSTRIDIUM DIFFICILE BY PCR     Status: None   Collection Time    01/26/13  1:45 AM      Result Value Range Status   C difficile by pcr NEGATIVE  NEGATIVE Final   Comment: Performed at [facility]  CULTURE, BLOOD (ROUTINE X 2)     Status: None   Collection Time    01/27/13  5:30 AM      Result Value Range Status   Specimen Description BLOOD RIGHT ARM   Final   Special Requests     Final   Value: BOTTLES DRAWN AEROBIC AND ANAEROBIC 3 CC BLUE,2 CC RED   Culture  Setup Time     Final   Value: 01/27/2013 15:09     Performed at [facility]   Culture     Final   Value:        BLOOD CULTURE RECEIVED NO GROWTH TO DATE CULTURE WILL BE HELD FOR 5 DAYS BEFORE ISSUING A FINAL NEGATIVE REPORT     Performed at [facility]   Report Status PENDING   Incomplete  CULTURE, BLOOD (ROUTINE X 2)     Status: None   Collection Time    01/27/13  5:35 AM       Result Value Range Status   Specimen Description BLOOD RIGHT HAND   Final   Special Requests BOTTLES DRAWN AEROBIC AND ANAEROBIC 2 CC EA   Final   Culture  Setup Time     Final   Value: 01/27/2013 14:58     Performed at [facility]   Culture     Final   Value:        BLOOD CULTURE RECEIVED NO GROWTH TO DATE CULTURE WILL BE HELD FOR 5 DAYS BEFORE ISSUING A FINAL NEGATIVE REPORT     Performed at [facility]   Report Status PENDING   Incomplete  BODY FLUID CULTURE     Status: None   Collection Time    01/28/13  5:28 PM      Result Value Range Status   Specimen Description OTHER   Final   Special Requests Normal   Final   Gram Stain     Final   Value: NO WBC SEEN     NO ORGANISMS SEEN     Performed at [facility]   Culture     Final   Value: Culture reincubated for better growth     Performed at [facility]   Report Status PENDING   Incomplete    Studies/Results: Dg Abd 1 View  01/28/2013   *RADIOLOGY REPORT*  Clinical Data: Nausea and vomiting.  ABDOMEN - 1 VIEW  Comparison: CT 01/25/2013  Findings: There is a large gas-filled structure in the mid abdomen. This probably represents a large distended stomach.  Otherwise, there is stool throughout the abdomen and pelvis.  IMPRESSION: Large gas-filled structure in the abdomen probably represents massive distention of the stomach.  These results will be called to the ordering clinician or representative by the Radiologist Assistant, and communication documented in the PACS Dashboard.   Original Report Authenticated By: Richarda Overlie, M.D.   Dg Chest Port 1 View  01/27/2013  CLINICAL DATA:  Congestion. Possible aspiration.  EXAM: PORTABLE CHEST - 1 VIEW  COMPARISON:  CHEST x-ray 01/24/2013.  FINDINGS: There is a left upper extremity PICC with tip terminating in the mid superior vena cava. Lung volumes are low, and there are bibasilar opacities (right greater than left), which may reflect areas of  atelectasis and or consolidation. Increasing moderate right and small left pleural effusions. Cephalization of the pulmonary vasculature. Heart size appears borderline to mildly enlarged, likely accentuated by low lung volumes, portable technique and leftward patient rotation. Mediastinal contours are also distorted. Irregular opacity in the periphery of the right upper lobe is similar to numerous prior examinations, favored to represent some chronic post infectious scarring. There may be some calcified granulomas in this region.  IMPRESSION: 1. Support apparatus, as above. 2. Worsening aeration, particularly at the right base, concerning for increasing atelectasis and or consolidation with increasing moderate right and small left pleural effusions.   Electronically Signed   By: Trudie Reed M.D.   On: 01/27/2013 20:45    Assessment: I suspect that his recent fevers were probably do to a new Pseudomonas urinary tract infection. He appears that he had his right hip aspirate yesterday he although there is no note from interventional radiology. A body fluid specimen showed no white blood cells and no organisms and cultures are pending. He has been on long-term IV antibiotics and I suspect that he does not have any active pelvic or hip infection. I will continue current antibiotics for now pending final culture results.  Plan: 1. Continue current antibiotics pending hip aspirate culture results  Cliffton Asters, MD [facility] [phone] pager   [phone] cell 01/29/2013, 4:35 PM

## 2013-01-30 ENCOUNTER — Inpatient Hospital Stay (HOSPITAL_COMMUNITY): Payer: Medicare Other

## 2013-01-30 LAB — BASIC METABOLIC PANEL
BUN: 21 mg/dL (ref 6–23)
Calcium: 8.9 mg/dL (ref 8.4–10.5)
GFR calc non Af Amer: >90 mL/min (ref >90)
Glucose, Bld: 136 mg/dL — ABNORMAL HIGH (ref 70–99)

## 2013-01-30 LAB — CBC
MCH: 23.1 pg — ABNORMAL LOW (ref 26.0–34.0)
MCHC: 29.8 g/dL — ABNORMAL LOW (ref 30.0–36.0)
Platelets: 577 10*3/uL — ABNORMAL HIGH (ref 150–400)

## 2013-01-30 MED ORDER — ALTEPLASE 2 MG IJ SOLR
2.0000 mg | Freq: Once | INTRAMUSCULAR | Status: AC
  Administered 2013-01-30: 2 mg
  Filled 2013-01-30: qty 2

## 2013-01-30 NOTE — Progress Notes (Signed)
Patient ID: [name] [name], male   DOB: 1965/10/24, 47 y.o.   MRN: [id number]         [facility]    Date of Admission:  01/24/2013   Near continuous IV antibiotic therapy for the past 6 months        Day 6 vancomycin        Day 5 ceftazadime Principal Problem:   Fever Active Problems:   Sacral decubitus ulcer, stage IV   Quadriplegia   Seizure disorder   Suprapubic catheter   Complicated UTI (urinary tract infection)   OSA on CPAP   Anemia   Chronic osteomyelitis, pelvic region and thigh   . cefTAZidime (FORTAZ)  IV  2 g Intravenous Q8H  . furosemide  20 mg Intravenous Daily  . metoCLOPramide (REGLAN) injection  5 mg Intravenous Q8H  . pantoprazole (PROTONIX) IV  40 mg Intravenous Q24H  . sodium chloride  3 mL Intravenous Q12H  . vancomycin  1,250 mg Intravenous Q12H    Subjective: He is feeling better. He now tells me that he never had an aspirate of his right hip 2 days ago. There is a body fluid culture from 9/15 that does not list a source. There is no indication that interventional radiology performed a procedure on that day. He says that the nurses performed a swab specimen from his decubitus ulcer.  Objective: Temp:  [98 F (36.7 C)-98.3 F (36.8 C)] 98 F (36.7 C) (09/17 0618) Pulse Rate:  [107-108] 107 (09/17 0618) Resp:  [18] 18 (09/17 0618) BP: (110-115)/(66-72) 115/66 mmHg (09/17 0618) SpO2:  [98 %] 98 % (09/17 0618)  General: He is in good spirits watching TV Skin: PICC site normal Sacral wounds not reexamined  Lab Results Lab Results  Component Value Date   WBC 21.7* 01/30/2013   HGB 9.2* 01/30/2013   HCT 30.9* 01/30/2013   MCV 77.6* 01/30/2013   PLT 577* 01/30/2013    Lab Results  Component Value Date   CREATININE 0.57 01/30/2013   BUN 21 01/30/2013   NA 148* 01/30/2013   K 3.8 01/30/2013   CL 108 01/30/2013   CO2 33* 01/30/2013    Lab Results  Component Value Date   ALT 30 09/05/2012   AST 27 09/05/2012   ALKPHOS 102  09/05/2012   BILITOT 0.1* 09/05/2012      Microbiology: Recent Results (from the past 240 hour(s))  URINE CULTURE     Status: None   Collection Time    01/24/13 10:13 PM      Result Value Range Status   Specimen Description URINE, CATHETERIZED   Final   Special Requests NONE   Final   Culture  Setup Time     Final   Value: 01/25/2013 02:30     Performed at [facility] Count     Final   Value: 15,000 COLONIES/ML     Performed at [facility]   Culture     Final   Value: PSEUDOMONAS AERUGINOSA     Performed at [facility]   Report Status 01/26/2013 FINAL   Final   Organism ID, Bacteria PSEUDOMONAS AERUGINOSA   Final  URINE CULTURE     Status: None   Collection Time    01/25/13  5:51 AM      Result Value Range Status   Specimen Description URINE, CLEAN CATCH   Final   Special Requests Normal   Final   Culture  Setup Time     Final   Value: 01/25/2013 09:43     Performed at [facility] Count     Final   Value: 80,000 COLONIES/ML     Performed at [facility]   Culture     Final   Value: Multiple bacterial morphotypes present, none predominant. Suggest appropriate recollection if clinically indicated.     Performed at [facility]   Report Status 01/26/2013 FINAL   Final  CLOSTRIDIUM DIFFICILE BY PCR     Status: None   Collection Time    01/26/13  1:45 AM      Result Value Range Status   C difficile by pcr NEGATIVE  NEGATIVE Final   Comment: Performed at [facility]  CULTURE, BLOOD (ROUTINE X 2)     Status: None   Collection Time    01/27/13  5:30 AM      Result Value Range Status   Specimen Description BLOOD RIGHT ARM   Final   Special Requests     Final   Value: BOTTLES DRAWN AEROBIC AND ANAEROBIC 3 CC BLUE,2 CC RED   Culture  Setup Time     Final   Value: 01/27/2013 15:09     Performed at [facility]   Culture     Final   Value:        BLOOD CULTURE RECEIVED NO GROWTH TO DATE  CULTURE WILL BE HELD FOR 5 DAYS BEFORE ISSUING A FINAL NEGATIVE REPORT     Performed at [facility]   Report Status PENDING   Incomplete  CULTURE, BLOOD (ROUTINE X 2)     Status: None   Collection Time    01/27/13  5:35 AM      Result Value Range Status   Specimen Description BLOOD RIGHT HAND   Final   Special Requests BOTTLES DRAWN AEROBIC AND ANAEROBIC 2 CC EA   Final   Culture  Setup Time     Final   Value: 01/27/2013 14:58     Performed at [facility]   Culture     Final   Value:        BLOOD CULTURE RECEIVED NO GROWTH TO DATE CULTURE WILL BE HELD FOR 5 DAYS BEFORE ISSUING A FINAL NEGATIVE REPORT     Performed at [facility]   Report Status PENDING   Incomplete  BODY FLUID CULTURE     Status: None   Collection Time    01/28/13  5:28 PM      Result Value Range Status   Specimen Description OTHER   Final   Special Requests Normal   Final   Gram Stain     Final   Value: NO WBC SEEN     NO ORGANISMS SEEN     Performed at [facility]   Culture     Final   Value: MODERATE GRAM NEGATIVE RODS     Performed at [facility]   Report Status PENDING   Incomplete    Studies/Results: Dg Abd 1 View  01/30/2013   CLINICAL DATA:  Followup gastric Alan obstruction.  EXAM: ABDOMEN - 1 VIEW  COMPARISON:  01/28/2013  FINDINGS: NG tube is present in the proximal to mid stomach. Significant improvement in the previously seen gaseous distention of the stomach. A small amount of gas is noted in the distal stomach.  Relative paucity of gas throughout the abdomen otherwise.  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 Prior resection of the right femoral head. These findings are stable.  IMPRESSION: Improving gaseous distention of the stomach. Relative paucity of gas throughout the remainder of the abdomen.   Electronically Signed   By: Charlett Nose M.D.   On: 01/30/2013 13:00   Dg Abd 1 View  01/28/2013    *RADIOLOGY REPORT*  Clinical Data: Nausea and vomiting.  ABDOMEN - 1 VIEW  Comparison: CT 01/25/2013  Findings: There is a large gas-filled structure in the mid abdomen. This probably represents a large distended stomach.  Otherwise, there is stool throughout the abdomen and pelvis.  IMPRESSION: Large gas-filled structure in the abdomen probably represents massive distention of the stomach.  These results will be called to the ordering clinician or representative by the Radiologist Assistant, and communication documented in the PACS Dashboard.   Original Report Authenticated By: Richarda Overlie, M.D.    Assessment: I reviewed his recent pelvic CT again and compared it with the films from March of this year. He had gas and around the right hip in March and it is still present. He has received multiple prolonged IV antibiotic courses since March. I suspect that his recent recurrent fever is due to Pseudomonas UTI rather than complications of his chronic osteomyelitis. I favor treating with a short course of ceftazidime for her UTI and observing off of antibiotics. If he shows further signs of possible persistent pelvic infection he should then undergo right hip aspiration or open incision and drainage to obtain specimens to guide antibiotic therapy. If he does relapse it is unlikely that he could be cured with medical therapy alone and consideration should be given to orthopedic reevaluation.  Plan: 1. Continue ceftazadime and treat for 10-14 days total. 2. Discontinue vancomycin  Cliffton Asters, MD [facility] Disease [facility] [phone] pager   [phone] cell 01/30/2013, 2:33 PM

## 2013-01-30 NOTE — Progress Notes (Signed)
Pt has NG tube in place and does not want to wear cpap at this time.
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
## 2013-01-31 DIAGNOSIS — M8660 Other chronic osteomyelitis, unspecified site: Secondary | ICD-10-CM

## 2013-01-31 LAB — COMPREHENSIVE METABOLIC PANEL
ALT: 13 U/L (ref 0–53)
AST: 12 U/L (ref 0–37)
Albumin: 1.7 g/dL — ABNORMAL LOW (ref 3.5–5.2)
Calcium: 8.7 mg/dL (ref 8.4–10.5)
GFR calc Af Amer: >90 mL/min (ref >90)
Sodium: 144 mEq/L (ref 135–145)
Total Protein: 6.8 g/dL (ref 6.0–8.3)

## 2013-01-31 LAB — CBC
MCH: 22.6 pg — ABNORMAL LOW (ref 26.0–34.0)
MCHC: 29 g/dL — ABNORMAL LOW (ref 30.0–36.0)
Platelets: 543 10*3/uL — ABNORMAL HIGH (ref 150–400)
RDW: 20.4 % — ABNORMAL HIGH (ref 11.5–15.5)
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
## 2013-01-31 NOTE — Progress Notes (Addendum)
Patient ID: [name] [name], male   DOB: [birth year], 47 y.o.   MRN: [id number]         [facility]    Date of Admission:  01/24/2013   Near continuous IV antibiotic therapy for the past 6 months        (Day 6 vancomycin-d/c'd on 9/17)        Day 6 ceftazadime Principal Problem:   Fever Active Problems:   Quadriplegia   Seizure disorder   Sacral decubitus ulcer, stage IV   Suprapubic catheter   Complicated UTI (urinary tract infection)   OSA on CPAP   Anemia   Chronic osteomyelitis, pelvic region and thigh   . cefTAZidime (FORTAZ)  IV  2 g Intravenous Q8H  . furosemide  20 mg Intravenous Daily  . metoCLOPramide (REGLAN) injection  5 mg Intravenous Q8H  . pantoprazole (PROTONIX) IV  40 mg Intravenous Q24H  . sodium chloride  3 mL Intravenous Q12H    Subjective: He is feeling better. He now tells me that he never had an aspirate of his right hip 2 days ago. .  Objective: Temp:  [98 F (36.7 C)-98.5 F (36.9 C)] 98 F (36.7 C) (09/18 1300) Pulse Rate:  [101-106] 106 (09/18 1300) Resp:  [18] 18 (09/18 0559) BP: (96-138)/(6-80) 110/73 mmHg (09/18 1300) SpO2:  [98 %-99 %] 99 % (09/18 1300)  General: He is in good spirits watching TV Skin: PICC site normal Sacral wounds not reexamined  Lab Results Lab Results  Component Value Date   WBC 17.8* 01/31/2013   HGB 8.9* 01/31/2013   HCT 30.7* 01/31/2013   MCV 78.1 01/31/2013   PLT 543* 01/31/2013    Lab Results  Component Value Date   CREATININE 0.48* 01/31/2013   BUN 16 01/31/2013   NA 144 01/31/2013   K 3.7 01/31/2013   CL 105 01/31/2013   CO2 31 01/31/2013    Lab Results  Component Value Date   ALT 13 01/31/2013   AST 12 01/31/2013   ALKPHOS 95 01/31/2013   BILITOT 0.1* 01/31/2013      Microbiology: Recent Results (from the past 240 hour(s))  URINE CULTURE     Status: None   Collection Time    01/24/13 10:13 PM      Result Value Range Status   Specimen Description URINE, CATHETERIZED   Final     Special Requests NONE   Final   Culture  Setup Time     Final   Value: 01/25/2013 02:30     Performed at [facility] Count     Final   Value: 15,000 COLONIES/ML     Performed at [facility]   Culture     Final   Value: PSEUDOMONAS AERUGINOSA     Performed at [facility]   Report Status 01/26/2013 FINAL   Final   Organism ID, Bacteria PSEUDOMONAS AERUGINOSA   Final  URINE CULTURE     Status: None   Collection Time    01/25/13  5:51 AM      Result Value Range Status   Specimen Description URINE, CLEAN CATCH   Final   Special Requests Normal   Final   Culture  Setup Time     Final   Value: 01/25/2013 09:43     Performed at [facility] Count     Final   Value: 80,000 COLONIES/ML     Performed at [facility]   Culture     Final   Value: Multiple bacterial morphotypes present, none predominant. Suggest appropriate recollection if clinically indicated.     Performed at [facility]   Report Status 01/26/2013 FINAL   Final  CLOSTRIDIUM DIFFICILE BY PCR     Status: None   Collection Time    01/26/13  1:45 AM      Result Value Range Status   C difficile by pcr NEGATIVE  NEGATIVE Final   Comment: Performed at [facility]  CULTURE, BLOOD (ROUTINE X 2)     Status: None   Collection Time    01/27/13  5:30 AM      Result Value Range Status   Specimen Description BLOOD RIGHT ARM   Final   Special Requests     Final   Value: BOTTLES DRAWN AEROBIC AND ANAEROBIC 3 CC BLUE,2 CC RED   Culture  Setup Time     Final   Value: 01/27/2013 15:09     Performed at [facility]   Culture     Final   Value:        BLOOD CULTURE RECEIVED NO GROWTH TO DATE CULTURE WILL BE HELD FOR 5 DAYS BEFORE ISSUING A FINAL NEGATIVE REPORT     Performed at [facility]   Report Status PENDING   Incomplete  CULTURE, BLOOD (ROUTINE X 2)     Status: None   Collection Time    01/27/13  5:35 AM      Result Value Range  Status   Specimen Description BLOOD RIGHT HAND   Final   Special Requests BOTTLES DRAWN AEROBIC AND ANAEROBIC 2 CC EA   Final   Culture  Setup Time     Final   Value: 01/27/2013 14:58     Performed at [facility]   Culture     Final   Value:        BLOOD CULTURE RECEIVED NO GROWTH TO DATE CULTURE WILL BE HELD FOR 5 DAYS BEFORE ISSUING A FINAL NEGATIVE REPORT     Performed at [facility]   Report Status PENDING   Incomplete  BODY FLUID CULTURE     Status: None   Collection Time    01/28/13  5:28 PM      Result Value Range Status   Specimen Description OTHER   Final   Special Requests Normal   Final   Gram Stain     Final   Value: NO WBC SEEN     NO ORGANISMS SEEN     Performed at [facility]   Culture     Final   Value: MODERATE GRAM NEGATIVE RODS     Performed at [facility]   Report Status PENDING   Incomplete    Studies/Results: Dg Abd 1 View  01/30/2013   CLINICAL DATA:  Followup gastric Alan obstruction.  EXAM: ABDOMEN - 1 VIEW  COMPARISON:  01/28/2013  FINDINGS: NG tube is present in the proximal to mid stomach. Significant improvement in the previously seen gaseous distention of the stomach. A small amount of gas is noted in the distal stomach.  Relative paucity of gas throughout the abdomen otherwise.  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 Prior resection of the right femoral head. These findings are stable.  IMPRESSION: Improving gaseous distention of the stomach. Relative paucity of gas throughout the remainder of the abdomen.  Electronically Signed   By: Charlett Nose M.D.   On: 01/30/2013 13:00    Assessment:  recent recurrent fever is due to Pseudomonas UTI rather than complications of his chronic osteomyelitis. I favor treating with a short course of ceftazidime for her UTI and observing off of antibiotics.currently on day #6 of 14 for complicated uti. Superficial swab culture  showing GNR, not reflective of bone culture  Plan: 1. Continue ceftazadime and treat for 10-14 days total. 2. Will follow superficial swab cx,  However since his leukocytosis is improving, the culture results unlikely to change recommendation for uti.   Duke Salvia Drue Second MD MPH [facility] [phone]

## 2013-01-31 NOTE — Progress Notes (Signed)
CSW assisting with d/c planning. [facility] contacted and update provided. SNF is able to admit [facility] if pt is ready for d/c. CSW will continue to follow to assist with d/c planning back to SNF.  Cori Razor LCSW [phone]

## 2013-01-31 NOTE — Progress Notes (Signed)
Dr. Mahala Menghini aware via phone of IV team reported that they could not draw blood back on PICC line yet flushed well. MD also updated on status of stage IV decubitus and sloughing noted on lt buttock. See new orders entered into EPIC per Dr. Mahala Menghini.

## 2013-01-31 NOTE — Consult Note (Addendum)
WOC consult Note Reason for Consult:Patient is well known to our service and was seen upon admission on 01/25/13.  I added a bariatric bed with low air loss feature on 01/28/13.  There is a small area that is new to the left buttock wound's presentation today and that is an area of yellow slough.  Wound description is still 80% red, 20% yellow slough. I will not make a change to this patient's POC as wound care should be performed twice daily with normal saline and repositioning performed per our house routine. I will ask the nursing staff to ensure that the saline moistened gauze makes contact with the area of yellow slough and that it should be removed when dry, not re moistened to mechanically remove the thin layer of yellow slough.   Please reconsult if there are changes requiring further action and we will be happy to see. WOC nursing team will not follow, but will remain available to this patient, the nursing and medical team.  Please re-consult if needed. Thanks, Ladona Mow, MSN, RN, GNP, [place], CWON-AP [phone]) :

## 2013-02-01 ENCOUNTER — Inpatient Hospital Stay (HOSPITAL_COMMUNITY): Payer: Medicare Other

## 2013-02-01 MED ORDER — HEPARIN SOD (PORK) LOCK FLUSH 100 UNIT/ML IV SOLN
250.0000 [IU] | INTRAVENOUS | Status: DC | PRN
  Administered 2013-02-01: 250 [IU]
  Filled 2013-02-01: qty 3

## 2013-02-01 MED ORDER — FUROSEMIDE 10 MG/ML IJ SOLN: 20.0000 mg | Freq: Every day | INTRAMUSCULAR | Status: DC

## 2013-02-01 MED ORDER — DEXTROSE 5 % IV SOLN: 2.0000 g | Freq: Three times a day (TID) | INTRAVENOUS | Status: AC

## 2013-02-01 MED ORDER — DEXTROSE 5 % IV SOLN: 2.0000 g | Freq: Three times a day (TID) | INTRAVENOUS | Status: DC

## 2013-02-01 MED ORDER — HEPARIN SOD (PORK) LOCK FLUSH 100 UNIT/ML IV SOLN
250.0000 [IU] | Freq: Every day | INTRAVENOUS | Status: DC
  Administered 2013-02-01: 250 [IU]
  Filled 2013-02-01: qty 3

## 2013-02-01 NOTE — Progress Notes (Signed)
Patient has current left arm picc that they were unable to aspirate blood for labs.  It was requested that we exchange the picc.  I flushed the picc with ans in a 3 cc syringe at bedside.  Catheter aspirated easily.  Rn was notified and the picc exchange was cancelled unless that have further problems.
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
## 2013-02-01 NOTE — Progress Notes (Signed)
Pt d/c back to [facility] today via [facility]. [employer] provided authorization for SNF and ambulance transport.  Cori Razor LCSW [phone]

## 2013-02-02 LAB — BODY FLUID CULTURE
Gram Stain: NONE SEEN
Special Requests: NORMAL

## 2013-02-04 LAB — CULTURE, BLOOD (ROUTINE X 2): Culture: NO GROWTH

## 2013-02-13 ENCOUNTER — Ambulatory Visit: Payer: Medicare Other | Admitting: Infectious Diseases

## 2013-02-20 ENCOUNTER — Inpatient Hospital Stay (HOSPITAL_COMMUNITY)
Admission: EM | Admit: 2013-02-20 | Discharge: 2013-03-02 | DRG: 602 | Disposition: A | Discharge status: Home Health | Payer: Medicare Other | Attending: Internal Medicine | Admitting: Internal Medicine

## 2013-02-20 ENCOUNTER — Encounter (HOSPITAL_COMMUNITY): Payer: Self-pay | Admitting: Emergency Medicine

## 2013-02-20 ENCOUNTER — Emergency Department (HOSPITAL_COMMUNITY): Payer: Medicare Other

## 2013-02-20 DIAGNOSIS — L89309 Pressure ulcer of unspecified buttock, unspecified stage: Secondary | ICD-10-CM | POA: Diagnosis present

## 2013-02-20 DIAGNOSIS — Z9359 Other cystostomy status: Secondary | ICD-10-CM

## 2013-02-20 DIAGNOSIS — L02416 Cutaneous abscess of left lower limb: Secondary | ICD-10-CM

## 2013-02-20 DIAGNOSIS — E669 Obesity, unspecified: Secondary | ICD-10-CM

## 2013-02-20 DIAGNOSIS — E871 Hypo-osmolality and hyponatremia: Secondary | ICD-10-CM

## 2013-02-20 DIAGNOSIS — M86652 Other chronic osteomyelitis, left thigh: Secondary | ICD-10-CM

## 2013-02-20 DIAGNOSIS — Z87440 Personal history of urinary (tract) infections: Secondary | ICD-10-CM

## 2013-02-20 DIAGNOSIS — Z981 Arthrodesis status: Secondary | ICD-10-CM

## 2013-02-20 DIAGNOSIS — A419 Sepsis, unspecified organism: Secondary | ICD-10-CM

## 2013-02-20 DIAGNOSIS — G40909 Epilepsy, unspecified, not intractable, without status epilepticus: Secondary | ICD-10-CM

## 2013-02-20 DIAGNOSIS — L89109 Pressure ulcer of unspecified part of back, unspecified stage: Secondary | ICD-10-CM | POA: Diagnosis present

## 2013-02-20 DIAGNOSIS — E46 Unspecified protein-calorie malnutrition: Secondary | ICD-10-CM

## 2013-02-20 DIAGNOSIS — J9610 Chronic respiratory failure, unspecified whether with hypoxia or hypercapnia: Secondary | ICD-10-CM | POA: Diagnosis present

## 2013-02-20 DIAGNOSIS — I10 Essential (primary) hypertension: Secondary | ICD-10-CM

## 2013-02-20 DIAGNOSIS — E876 Hypokalemia: Secondary | ICD-10-CM | POA: Diagnosis present

## 2013-02-20 DIAGNOSIS — D72829 Elevated white blood cell count, unspecified: Secondary | ICD-10-CM

## 2013-02-20 DIAGNOSIS — L03119 Cellulitis of unspecified part of limb: Secondary | ICD-10-CM

## 2013-02-20 DIAGNOSIS — M2450 Contracture, unspecified joint: Secondary | ICD-10-CM | POA: Diagnosis present

## 2013-02-20 DIAGNOSIS — D509 Iron deficiency anemia, unspecified: Secondary | ICD-10-CM

## 2013-02-20 DIAGNOSIS — D500 Iron deficiency anemia secondary to blood loss (chronic): Secondary | ICD-10-CM | POA: Diagnosis present

## 2013-02-20 DIAGNOSIS — E43 Unspecified severe protein-calorie malnutrition: Secondary | ICD-10-CM | POA: Diagnosis present

## 2013-02-20 DIAGNOSIS — M869 Osteomyelitis, unspecified: Secondary | ICD-10-CM | POA: Diagnosis present

## 2013-02-20 DIAGNOSIS — A491 Streptococcal infection, unspecified site: Secondary | ICD-10-CM

## 2013-02-20 DIAGNOSIS — L02419 Cutaneous abscess of limb, unspecified: Principal | ICD-10-CM | POA: Diagnosis present

## 2013-02-20 DIAGNOSIS — T85898A Other specified complication of other internal prosthetic devices, implants and grafts, initial encounter: Secondary | ICD-10-CM | POA: Diagnosis present

## 2013-02-20 DIAGNOSIS — M86651 Other chronic osteomyelitis, right thigh: Secondary | ICD-10-CM

## 2013-02-20 DIAGNOSIS — A4902 Methicillin resistant Staphylococcus aureus infection, unspecified site: Secondary | ICD-10-CM

## 2013-02-20 DIAGNOSIS — D473 Essential (hemorrhagic) thrombocythemia: Secondary | ICD-10-CM | POA: Diagnosis present

## 2013-02-20 DIAGNOSIS — M86659 Other chronic osteomyelitis, unspecified thigh: Secondary | ICD-10-CM

## 2013-02-20 DIAGNOSIS — G8250 Quadriplegia, unspecified: Secondary | ICD-10-CM

## 2013-02-20 DIAGNOSIS — Z933 Colostomy status: Secondary | ICD-10-CM

## 2013-02-20 DIAGNOSIS — L89154 Pressure ulcer of sacral region, stage 4: Secondary | ICD-10-CM

## 2013-02-20 DIAGNOSIS — I739 Peripheral vascular disease, unspecified: Secondary | ICD-10-CM

## 2013-02-20 DIAGNOSIS — Z8614 Personal history of Methicillin resistant Staphylococcus aureus infection: Secondary | ICD-10-CM

## 2013-02-20 DIAGNOSIS — J189 Pneumonia, unspecified organism: Secondary | ICD-10-CM

## 2013-02-20 DIAGNOSIS — IMO0002 Reserved for concepts with insufficient information to code with codable children: Secondary | ICD-10-CM

## 2013-02-20 DIAGNOSIS — D649 Anemia, unspecified: Secondary | ICD-10-CM

## 2013-02-20 DIAGNOSIS — Y849 Medical procedure, unspecified as the cause of abnormal reaction of the patient, or of later complication, without mention of misadventure at the time of the procedure: Secondary | ICD-10-CM | POA: Diagnosis present

## 2013-02-20 DIAGNOSIS — G4733 Obstructive sleep apnea (adult) (pediatric): Secondary | ICD-10-CM

## 2013-02-20 DIAGNOSIS — L89209 Pressure ulcer of unspecified hip, unspecified stage: Secondary | ICD-10-CM | POA: Diagnosis present

## 2013-02-20 DIAGNOSIS — A498 Other bacterial infections of unspecified site: Secondary | ICD-10-CM

## 2013-02-20 DIAGNOSIS — R509 Fever, unspecified: Secondary | ICD-10-CM

## 2013-02-20 DIAGNOSIS — L8994 Pressure ulcer of unspecified site, stage 4: Secondary | ICD-10-CM

## 2013-02-20 DIAGNOSIS — N390 Urinary tract infection, site not specified: Secondary | ICD-10-CM

## 2013-02-20 DIAGNOSIS — M009 Pyogenic arthritis, unspecified: Secondary | ICD-10-CM | POA: Diagnosis present

## 2013-02-20 DIAGNOSIS — B379 Candidiasis, unspecified: Secondary | ICD-10-CM

## 2013-02-20 LAB — URINALYSIS, ROUTINE W REFLEX MICROSCOPIC
Bilirubin Urine: NEGATIVE
Glucose, UA: NEGATIVE mg/dL
Hgb urine dipstick: NEGATIVE
Ketones, ur: NEGATIVE mg/dL
Nitrite: POSITIVE — AB
Protein, ur: NEGATIVE mg/dL
Specific Gravity, Urine: 1.018 (ref 1.005–1.030)
Urobilinogen, UA: 0.2 mg/dL (ref 0.0–1.0)
pH: 5.5 (ref 5.0–8.0)

## 2013-02-20 LAB — URINE MICROSCOPIC-ADD ON

## 2013-02-20 MED ORDER — ALTEPLASE 2 MG IJ SOLR
2.0000 mg | Freq: Once | INTRAMUSCULAR | Status: AC
  Administered 2013-02-20: 2 mg
  Filled 2013-02-20: qty 2

## 2013-02-20 MED ORDER — SODIUM CHLORIDE 0.9 % IJ SOLN: 10.0000 mL | INTRAMUSCULAR | Status: DC | PRN

## 2013-02-20 MED ORDER — SODIUM CHLORIDE 0.9 % IJ SOLN
10.0000 mL | Freq: Two times a day (BID) | INTRAMUSCULAR | Status: DC
  Administered 2013-02-20: 10 mL

## 2013-02-20 NOTE — ED Notes (Signed)
IV team nurse flushed picc line with tPA; states that she will recheck line in 15 mins to 1 hr.

## 2013-02-20 NOTE — ED Notes (Signed)
Bed: ZO10 Expected date:  Expected time:  Means of arrival:  Comments: ems- ulcer

## 2013-02-20 NOTE — ED Notes (Signed)
IV team at bedside 

## 2013-02-20 NOTE — ED Notes (Signed)
Pt has picc line, but staff were unable to draw blood back earlier today. Charge attempted twice to get blood using ultrasound. PA alerted
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
## 2013-02-20 NOTE — ED Notes (Signed)
Cannot get rectal temp because dressing covering rectal area. PA aware.

## 2013-02-20 NOTE — ED Notes (Signed)
IV team paged.  

## 2013-02-21 ENCOUNTER — Encounter (HOSPITAL_COMMUNITY): Payer: Self-pay

## 2013-02-21 DIAGNOSIS — N390 Urinary tract infection, site not specified: Secondary | ICD-10-CM

## 2013-02-21 DIAGNOSIS — R509 Fever, unspecified: Secondary | ICD-10-CM

## 2013-02-21 DIAGNOSIS — G8250 Quadriplegia, unspecified: Secondary | ICD-10-CM

## 2013-02-21 DIAGNOSIS — L98499 Non-pressure chronic ulcer of skin of other sites with unspecified severity: Secondary | ICD-10-CM

## 2013-02-21 DIAGNOSIS — L02419 Cutaneous abscess of limb, unspecified: Principal | ICD-10-CM

## 2013-02-21 DIAGNOSIS — E46 Unspecified protein-calorie malnutrition: Secondary | ICD-10-CM

## 2013-02-21 DIAGNOSIS — D649 Anemia, unspecified: Secondary | ICD-10-CM

## 2013-02-21 DIAGNOSIS — L89109 Pressure ulcer of unspecified part of back, unspecified stage: Secondary | ICD-10-CM

## 2013-02-21 DIAGNOSIS — G40909 Epilepsy, unspecified, not intractable, without status epilepticus: Secondary | ICD-10-CM

## 2013-02-21 DIAGNOSIS — I10 Essential (primary) hypertension: Secondary | ICD-10-CM

## 2013-02-21 DIAGNOSIS — M86659 Other chronic osteomyelitis, unspecified thigh: Secondary | ICD-10-CM

## 2013-02-21 DIAGNOSIS — M009 Pyogenic arthritis, unspecified: Secondary | ICD-10-CM

## 2013-02-21 LAB — CBC WITH DIFFERENTIAL/PLATELET
Basophils Absolute: 0 10*3/uL (ref 0.0–0.1)
Basophils Relative: 0 % (ref 0–1)
Eosinophils Absolute: 0.4 10*3/uL (ref 0.0–0.7)
Eosinophils Relative: 2 % (ref 0–5)
HCT: 25.4 % — ABNORMAL LOW (ref 39.0–52.0)
Hemoglobin: 7.6 g/dL — ABNORMAL LOW (ref 13.0–17.0)
Lymphocytes Relative: 12 % (ref 12–46)
Lymphs Abs: 2.3 10*3/uL (ref 0.7–4.0)
MCH: 22.3 pg — ABNORMAL LOW (ref 26.0–34.0)
MCHC: 29.9 g/dL — ABNORMAL LOW (ref 30.0–36.0)
MCV: 74.5 fL — ABNORMAL LOW (ref 78.0–100.0)
Monocytes Absolute: 2.3 10*3/uL — ABNORMAL HIGH (ref 0.1–1.0)
Monocytes Relative: 12 % (ref 3–12)
Neutro Abs: 14.4 10*3/uL — ABNORMAL HIGH (ref 1.7–7.7)
Neutrophils Relative %: 74 % (ref 43–77)
Platelets: 671 10*3/uL — ABNORMAL HIGH (ref 150–400)
RBC: 3.41 MIL/uL — ABNORMAL LOW (ref 4.22–5.81)
RDW: 20.4 % — ABNORMAL HIGH (ref 11.5–15.5)
WBC: 19.4 10*3/uL — ABNORMAL HIGH (ref 4.0–10.5)

## 2013-02-21 LAB — BASIC METABOLIC PANEL
BUN: 14 mg/dL (ref 6–23)
CO2: 27 mEq/L (ref 19–32)
Calcium: 8.8 mg/dL (ref 8.4–10.5)
Chloride: 101 mEq/L (ref 96–112)
Creatinine, Ser: 0.41 mg/dL — ABNORMAL LOW (ref 0.50–1.35)
GFR calc Af Amer: >90 mL/min (ref >90)
GFR calc non Af Amer: >90 mL/min (ref >90)
Glucose, Bld: 120 mg/dL — ABNORMAL HIGH (ref 70–99)
Potassium: 3.2 mEq/L — ABNORMAL LOW (ref 3.5–5.1)
Sodium: 138 mEq/L (ref 135–145)

## 2013-02-21 LAB — C-REACTIVE PROTEIN: CRP: 18.1 mg/dL — ABNORMAL HIGH (ref <0.60)

## 2013-02-21 LAB — MRSA PCR SCREENING: MRSA by PCR: NEGATIVE

## 2013-02-21 MED ORDER — LEVOFLOXACIN IN D5W 750 MG/150ML IV SOLN
750.0000 mg | INTRAVENOUS | Status: DC
  Filled 2013-02-21: qty 150

## 2013-02-21 MED ORDER — LEVOFLOXACIN IN D5W 750 MG/150ML IV SOLN
750.0000 mg | Freq: Once | INTRAVENOUS | Status: AC
  Administered 2013-02-21: 750 mg via INTRAVENOUS
  Filled 2013-02-21: qty 150

## 2013-02-21 MED ORDER — VANCOMYCIN HCL IN DEXTROSE 1-5 GM/200ML-% IV SOLN
1000.0000 mg | Freq: Once | INTRAVENOUS | Status: AC
  Administered 2013-02-21: 1000 mg via INTRAVENOUS
  Filled 2013-02-21: qty 200

## 2013-02-21 MED ORDER — IOHEXOL 300 MG/ML  SOLN
100.0000 mL | Freq: Once | INTRAMUSCULAR | Status: AC | PRN
  Administered 2013-02-21: 100 mL via INTRAVENOUS

## 2013-02-21 MED ORDER — BACLOFEN 20 MG PO TABS
20.0000 mg | ORAL_TABLET | Freq: Three times a day (TID) | ORAL | Status: DC | PRN
  Administered 2013-02-21 – 2013-02-24 (×5): 20 mg via ORAL
  Filled 2013-02-21 (×6): qty 1

## 2013-02-21 MED ORDER — METOCLOPRAMIDE HCL 5 MG PO TABS
5.0000 mg | ORAL_TABLET | Freq: Three times a day (TID) | ORAL | Status: DC
  Administered 2013-02-21 – 2013-03-02 (×25): 5 mg via ORAL
  Filled 2013-02-21 (×30): qty 1

## 2013-02-21 MED ORDER — SODIUM CHLORIDE 0.9 % IJ SOLN
10.0000 mL | INTRAMUSCULAR | Status: DC | PRN
  Administered 2013-02-21: 20 mL
  Administered 2013-02-22 – 2013-02-23 (×2): 10 mL
  Administered 2013-02-24: 20 mL
  Administered 2013-02-25: 10 mL
  Administered 2013-02-25: 40 mL
  Administered 2013-02-26 – 2013-03-01 (×7): 10 mL

## 2013-02-21 MED ORDER — ENOXAPARIN SODIUM 40 MG/0.4ML ~~LOC~~ SOLN
40.0000 mg | SUBCUTANEOUS | Status: DC
  Administered 2013-02-21 – 2013-03-01 (×9): 40 mg via SUBCUTANEOUS
  Filled 2013-02-21 (×10): qty 0.4

## 2013-02-21 MED ORDER — SODIUM CHLORIDE 0.9 % IV SOLN: INTRAVENOUS | Status: DC

## 2013-02-21 MED ORDER — SODIUM CHLORIDE 0.9 % IV SOLN
500.0000 mg | Freq: Four times a day (QID) | INTRAVENOUS | Status: DC
  Administered 2013-02-21 – 2013-02-23 (×9): 500 mg via INTRAVENOUS
  Administered 2013-02-23: 03:00:00 via INTRAVENOUS
  Administered 2013-02-24 – 2013-02-25 (×6): 500 mg via INTRAVENOUS
  Filled 2013-02-21 (×18): qty 500

## 2013-02-21 MED ORDER — VANCOMYCIN HCL IN DEXTROSE 1-5 GM/200ML-% IV SOLN
1000.0000 mg | Freq: Two times a day (BID) | INTRAVENOUS | Status: DC
  Filled 2013-02-21: qty 200

## 2013-02-21 MED ORDER — SODIUM CHLORIDE 0.9 % IV SOLN
INTRAVENOUS | Status: DC
  Administered 2013-02-21 – 2013-03-01 (×6): via INTRAVENOUS
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
## 2013-02-21 NOTE — Consult Note (Signed)
General Surgery [facility]  Patient seen and examined.  Discussed with Will Marlyne Beards and Dr. Jomarie Longs.  No role for further wound debridement at present - follow up with Dr. Kelly Splinter.  Right thigh abscess needs to be evaluated by orthopedic surgery for I&D.  Will sign off - call if general surgical issue arises.  Velora Heckler, MD, [facility] Office: [phone]
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
## 2013-02-21 NOTE — ED Notes (Signed)
Delay on pt going to floor d/t both departments ensuring pt has the proper bariatric bed with air overlay mattress.

## 2013-02-21 NOTE — Progress Notes (Signed)
[facility] PROGRESS NOTE  [name] [name] YNW:[id number] DOB: 1965-11-02 DOA: 02/20/2013 PCP: [name], MD  Assessment/Plan:  1. L anterior thigh abscess with large decubitus wounds and some air tracking to R and L Hip -continue IV Vanc, DC levaquin, start Primaxin -CCS consulted -ORtho Consulted, D/w Dr.Xu -will need I&D and Fu cultures -will request ID consult as well  2. 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 Chronic indwelling foley -UA abnormal appearing difficult to differentiate UTI vs Colonization since pt has no sensations  4. Quadriplegia  -following C5 fracture (1991)  5. Severe protein calorie malnutrition -albumin 1.7, Nutrition consult  6. Reactive thrombocytosis   Code Status: Full Family Communication: none at bedside Disposition Plan: to be determined   Consultants:  CCS  Orthopedics  Antibiotics:  Vanc  LEvaquin  HPI/Subjective: Low grade fever off and on for 1 week at SNF  Objective: Filed Vitals:   02/21/13 0655  BP: 119/75  Pulse: 100  Temp: 99.2 F (37.3 C)  Resp: 20    Intake/Output Summary (Last 24 hours) at 02/21/13 1153 Last data filed at 02/20/13 2200  Gross per 24 hour  Intake     10 ml  Output      0 ml  Net     10 ml   There were no vitals filed for this visit.  Exam:   General:  AAOx3, no distress  Cardiovascular:S1S2/RRR  Respiratory: CTAB  Abdomen: soft, Nt, obese, BS present  Musculoskeletal: no edema c/c   Data Reviewed: Basic Metabolic  Panel:  Recent Labs Lab 02/20/13 2358  NA 138  K 3.2*  CL 101  CO2 27  GLUCOSE 120*  BUN 14  CREATININE 0.41*  CALCIUM 8.8   Liver Function Tests: No results found for this basename: AST, ALT, ALKPHOS, BILITOT, PROT, ALBUMIN,  in the last 168 hours No results found for this basename: LIPASE, AMYLASE,  in the last 168 hours No results found for this basename: AMMONIA,  in the last 168 hours CBC:  Recent Labs Lab 02/20/13 2358  WBC 19.4*  NEUTROABS 14.4*  HGB 7.6*  HCT 25.4*  MCV 74.5*  PLT 671*   Cardiac Enzymes: No results found for this basename: CKTOTAL, CKMB, CKMBINDEX, TROPONINI,  in the last 168 hours BNP (last 3 results) No results found for this basename: PROBNP,  in the last 8760 hours CBG: No results found for this basename: GLUCAP,  in the last 168 hours  No results found for this or any previous visit (from the past 240 hour(s)).   Studies: Ct Pelvis W Contrast  02/21/2013   *RADIOLOGY REPORT*  Clinical Data:  Poorly healing bilateral sacral wounds; evaluate wounds.  CT PELVIS WITH CONTRAST  Technique:  Multidetector CT imaging of the pelvis was performed using the standard protocol following the bolus administration of intravenous contrast.  Contrast: OMNIPAQUE IOHEXOL 300 MG/ML  SOLN  Comparison:  CT of the pelvis performed 01/25/2013  Findings:  Since the prior study, there has been interval development of a somewhat complex peripherally enhancing abscess within the anterior left thigh, measuring approximately 6.8 x  3.6 x 5.9 cm.  The degree of soft tissue inflammation within the proximal left thigh has also increased, reflecting cellulitis.  Note is also made of new tiny foci of air at the left hip joint space, compatible with new extension of the patient's left-sided sacral decubitus ulcers; the degree of soft tissue inflammation at the left hip is largely unchanged, though more peripheral surrounding soft tissue edema appears worsened.  The right hip  joint is unchanged in appearance, with a large sacral decubitus ulceration tracking through the hip joint and into a large pocket of air along the anterior right thigh.  There is diffuse destruction of both hip joints, with extensive deformity as previously described.  An unusual osseous extension is again noted arising from the right side of the pubic symphysis, into the anterior right thigh; this appears to reflect a displaced coccyx.  Decubitus ulcerations along both posterior acetabula extend to the bone, as noted on the prior study.  The decubitus ulceration at the sacrum is also unchanged, extending nearly to the bone.  The visualized small and large bowel are grossly unremarkable in appearance.  The bladder is decompressed, with a suprapubic catheter noted in unchanged position.  IMPRESSION:  1.  New 6.8 x 3.6 x 5.9 cm peripherally enhancing abscess in the anterior left thigh, with new cellulitis involving much of the left thigh. 2.  New tiny foci of air noted at the left hip joint space, compatible with new extension of the left-sided sacral decubitus ulcers; surrounding soft tissue edema appears worsened. 3.  Very large right-sided sacral decubitus ulceration unchanged in appearance, with air tracking through the right hip joint and into the anterior right thigh.  Decubitus ulcerations along both posterior acetabula and at the sacrum are essentially unchanged.  These results were called by telephone on 02/21/2013 at 02:10 a.m. to the clinical team at [facility], who verbally acknowledged these results.   Original Report Authenticated By: Tonia Ghent, M.D.    Scheduled Meds: . levofloxacin (LEVAQUIN) IV  750 mg Intravenous Q24H  . vancomycin  1,000 mg Intravenous Q12H   Continuous Infusions: . sodium chloride      Principal Problem:   Cellulitis and abscess of leg Active Problems:   Quadriplegia   Hyponatremia   Seizure disorder   Sacral decubitus ulcer, stage IV    Time spent:     [facility]  [facility] Pager [phone]. If 7PM-7AM, please contact night-coverage at [email or web address], password [facility] 02/21/2013, 11:53 AM  LOS: 1 day
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
## 2013-02-21 NOTE — Consult Note (Signed)
WOC wound consult note Reason for Consult: Sacral and buttock pressure ulcers, present on admission.  Abscess in left lateral thigh, surgical consult requested.  Wound type: Pressure ulcers, present on admission.  Pressure Ulcer POA: Yes Measurement: 4 areas in sacral/buttock region, closely approximated: Sacral wound 16 cm x 11 cm x 4 cm, medial 8 cm x 6.6cm x 1.8 cm, lateral 12.4 cm x 6.8 cm x 4 cm, resolving wound left buttock 2 cm x 1 cm x 0.3 cm. Wound bed: All wounds are 100% red, nongranulating wound bed, sacral and lateral wounds have exposed muscle. Drainage (amount, consistency, odor)  Moderate to heavy wound drainage . Periwound: Intact Dressing procedure/placement/frequency: Recommend NS moist to moist dressing changes twice daily.  Dressing change completed in ED this AM around 0630. Patient on LALM and Bariatric bed for pressure relief and redistribution.   CCS to follow left knee abscess.  Dry dressing in place at this time.   WOC nursing team will not follow at this time.  Please re-consult if needed.  Maple Hudson RN BSN CWON Pager [phone]

## 2013-02-21 NOTE — ED Provider Notes (Signed)
Please see the original providers' notes. I was available for consult during the completion of this patient's care.  Gerhard Munch, MD 02/21/13 [phone]

## 2013-02-21 NOTE — Progress Notes (Signed)
CSW met with pt to assist with d/c planning. Pt states he will return home following hospital d/c. Pt was admitted from [facility]. CSW is avaiable to assist with d/c planning back to SNF if plan changes. RNCM has been updated.  Cori Razor LCSW [phone]
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
## 2013-02-21 NOTE — ED Notes (Signed)
Pt gone for CT 

## 2013-02-21 NOTE — ED Provider Notes (Signed)
Patient's CT scan, shows, that he's got a large abscess in the lateral left thigh with some surrounding cellulitis.  Patient will be admitted to the, hospital.  He's been started on vancomycin.  I spoke with Dr. Onalee Hua from [facility] hospitalist he would like me to consult with surgery.  Arman Filter, NP 02/21/13 [phone]

## 2013-02-22 ENCOUNTER — Inpatient Hospital Stay (HOSPITAL_COMMUNITY): Payer: Medicare Other

## 2013-02-22 ENCOUNTER — Encounter (HOSPITAL_COMMUNITY): Payer: Self-pay | Admitting: *Deleted

## 2013-02-22 ENCOUNTER — Encounter (HOSPITAL_COMMUNITY): Payer: Self-pay | Admitting: Certified Registered Nurse Anesthetist

## 2013-02-22 ENCOUNTER — Encounter (HOSPITAL_COMMUNITY)
Admission: EM | Disposition: A | Discharge status: Home Health | Payer: Self-pay | Source: Home / Self Care | Attending: Internal Medicine

## 2013-02-22 DIAGNOSIS — L8990 Pressure ulcer of unspecified site, unspecified stage: Secondary | ICD-10-CM

## 2013-02-22 DIAGNOSIS — L8994 Pressure ulcer of unspecified site, stage 4: Secondary | ICD-10-CM

## 2013-02-22 LAB — BASIC METABOLIC PANEL
BUN: 11 mg/dL (ref 6–23)
CO2: 27 mEq/L (ref 19–32)
Calcium: 8.5 mg/dL (ref 8.4–10.5)
Chloride: 103 mEq/L (ref 96–112)
GFR calc Af Amer: >90 mL/min (ref >90)
Glucose, Bld: 110 mg/dL — ABNORMAL HIGH (ref 70–99)
Potassium: 3.2 mEq/L — ABNORMAL LOW (ref 3.5–5.1)

## 2013-02-22 LAB — CBC
HCT: 23.4 % — ABNORMAL LOW (ref 39.0–52.0)
Hemoglobin: 7 g/dL — ABNORMAL LOW (ref 13.0–17.0)
MCH: 22.4 pg — ABNORMAL LOW (ref 26.0–34.0)
MCV: 75 fL — ABNORMAL LOW (ref 78.0–100.0)
Platelets: 627 10*3/uL — ABNORMAL HIGH (ref 150–400)
RBC: 3.12 MIL/uL — ABNORMAL LOW (ref 4.22–5.81)
WBC: 16.8 10*3/uL — ABNORMAL HIGH (ref 4.0–10.5)

## 2013-02-22 SURGERY — IRRIGATION AND DEBRIDEMENT EXTREMITY
Anesthesia: General | Wound class: Clean Contaminated

## 2013-02-22 MED ORDER — FENTANYL CITRATE 0.05 MG/ML IJ SOLN
INTRAMUSCULAR | Status: AC
  Filled 2013-02-22: qty 6

## 2013-02-22 MED ORDER — MIDAZOLAM HCL 2 MG/2ML IJ SOLN
INTRAMUSCULAR | Status: AC
  Filled 2013-02-22: qty 6
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
## 2013-02-22 NOTE — Progress Notes (Addendum)
[facility] PROGRESS NOTE  [name] [name] XBJ:[id number] DOB: Oct 08, 1965 DOA: 02/20/2013 PCP: [name], MD  Assessment/Plan:  1. L anterior thigh abscess with large decubitus wounds and some air tracking to R and L Hip -Off IV Vanc, currently only on Primaxin -CCS signed off -ORtho Consult per Dr.Xu appreciated -IR to drain the thigh abscess, will FU cultures -appreciate ID consult   2. 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 Chronic indwelling foley -UA abnormal appearing difficult to differentiate UTI vs Colonization since pt has no sensations -currently on PRimaxin, FU cultures  4. Quadriplegia  -following C5 fracture (1991)  5. Severe protein calorie malnutrition -albumin 1.7, Nutrition consult  6. Reactive thrombocytosis  7. Anemia from ongoing oozing and blood loss from large decub -suspect will need transfusion tomorrow -CBC in 71m  Code Status: Full Family Communication: none at bedside Disposition Plan: to be determined   Consultants:  CCS  Orthopedics  Antibiotics:  Vanc  LEvaquin  HPI/Subjective: denies any complaints Objective: Filed Vitals:   02/22/13 0548  BP: 112/71  Pulse: 100  Temp: 100 F (37.8 C)  Resp: 20    Intake/Output Summary (Last 24 hours) at 02/22/13 1205 Last data filed at 02/22/13 1100  Gross per 24 hour  Intake    900 ml  Output   1125 ml  Net   -225 ml   There were no vitals filed for this visit.  Exam:   General:  AAOx3, no  distress  Cardiovascular:S1S2/RRR  Respiratory: CTAB  Abdomen: soft, Nt, obese, BS present  Musculoskeletal: contractures involving arms and legs  Tightness and fullness in L thigh  Quadriplegic  Data Reviewed: Basic Metabolic Panel:  Recent Labs Lab 02/20/13 2358 02/22/13 0510  NA 138 140  K 3.2* 3.2*  CL 101 103  CO2 27 27  GLUCOSE 120* 110*  BUN 14 11  CREATININE 0.41* 0.50  CALCIUM 8.8 8.5   Liver Function Tests: No results found for this basename: AST, ALT, ALKPHOS, BILITOT, PROT, ALBUMIN,  in the last 168 hours No results found for this basename: LIPASE, AMYLASE,  in the last 168 hours No results found for this basename: AMMONIA,  in the last 168 hours CBC:  Recent Labs Lab 02/20/13 2358 02/22/13 0510  WBC 19.4* 16.8*  NEUTROABS 14.4*  --   HGB 7.6* 7.0*  HCT 25.4* 23.4*  MCV 74.5* 75.0*  PLT 671* 627*   Cardiac Enzymes: No results found for this basename: CKTOTAL, CKMB, CKMBINDEX, TROPONINI,  in the last 168 hours BNP (last 3 results) No results found for this basename: PROBNP,  in the last 8760 hours CBG: No results found for this basename: GLUCAP,  in the last 168 hours  Recent Results (from the past 240 hour(s))  URINE CULTURE     Status: None   Collection Time    02/20/13  7:34 PM      Result Value Range Status   Specimen Description URINE, CATHETERIZED   Final   Special Requests NONE   Final   Culture  Setup Time     Final   Value: 02/21/2013 02:28  Performed at [facility] Count     Final   Value: >=100,000 COLONIES/ML     Performed at [facility]   Culture     Final   Value: GRAM NEGATIVE RODS     Performed at [facility]   Report Status PENDING   Incomplete  MRSA PCR SCREENING     Status: None   Collection Time    02/21/13  1:41 PM      Result Value Range Status   MRSA by PCR NEGATIVE  NEGATIVE Final   Comment:            The GeneXpert MRSA Assay (FDA     approved for NASAL specimens      only), is one component of a     comprehensive MRSA colonization     surveillance program. It is not     intended to diagnose MRSA     infection nor to guide or     monitor treatment for     MRSA infections.     Studies: Ct Pelvis W Contrast  02/21/2013   *RADIOLOGY REPORT*  Clinical Data:  Poorly healing bilateral sacral wounds; evaluate wounds.  CT PELVIS WITH CONTRAST  Technique:  Multidetector CT imaging of the pelvis was performed using the standard protocol following the bolus administration of intravenous contrast.  Contrast: OMNIPAQUE IOHEXOL 300 MG/ML  SOLN  Comparison:  CT of the pelvis performed 01/25/2013  Findings:  Since the prior study, there has been interval development of a somewhat complex peripherally enhancing abscess within the anterior left thigh, measuring approximately 6.8 x 3.6 x 5.9 cm.  The degree of soft tissue inflammation within the proximal left thigh has also increased, reflecting cellulitis.  Note is also made of new tiny foci of air at the left hip joint space, compatible with new extension of the patient's left-sided sacral decubitus ulcers; the degree of soft tissue inflammation at the left hip is largely unchanged, though more peripheral surrounding soft tissue edema appears worsened.  The right hip joint is unchanged in appearance, with a large sacral decubitus ulceration tracking through the hip joint and into a large pocket of air along the anterior right thigh.  There is diffuse destruction of both hip joints, with extensive deformity as previously described.  An unusual osseous extension is again noted arising from the right side of the pubic symphysis, into the anterior right thigh; this appears to reflect a displaced coccyx.  Decubitus ulcerations along both posterior acetabula extend to the bone, as noted on the prior study.  The decubitus ulceration at the sacrum is also unchanged, extending nearly to the bone.  The visualized small and large bowel  are grossly unremarkable in appearance.  The bladder is decompressed, with a suprapubic catheter noted in unchanged position.  IMPRESSION:  1.  New 6.8 x 3.6 x 5.9 cm peripherally enhancing abscess in the anterior left thigh, with new cellulitis involving much of the left thigh. 2.  New tiny foci of air noted at the left hip joint space, compatible with new extension of the left-sided sacral decubitus ulcers; surrounding soft tissue edema appears worsened. 3.  Very large right-sided sacral decubitus ulceration unchanged in appearance, with air tracking through the right hip joint and into the anterior right thigh.  Decubitus ulcerations along both posterior acetabula and at the sacrum are essentially unchanged.  These results were called by telephone on 02/21/2013 at 02:10 a.m. to the clinical team  at [facility], who verbally acknowledged these results.   Original Report Authenticated By: Tonia Ghent, M.D.    Scheduled Meds: . enoxaparin (LOVENOX) injection  40 mg Subcutaneous Q24H  . imipenem-cilastatin  500 mg Intravenous Q6H  . metoCLOPramide  5 mg Oral TID AC   Continuous Infusions: . sodium chloride 75 mL/hr at 02/21/13 1343    Principal Problem:   Cellulitis and abscess of leg Active Problems:   Quadriplegia   Hyponatremia   Seizure disorder   Sacral decubitus ulcer, stage IV    Time spent:    [facility]  [facility] Pager [phone]. If 7PM-7AM, please contact night-coverage at [email or web address], password [facility] 02/22/2013, 12:05 PM  LOS: 2 days

## 2013-02-22 NOTE — Progress Notes (Signed)
[facility]  Day # 2 imipenem   Subjective: [name]   Antibiotics:  Anti-infectives   Start     Dose/Rate Route Frequency Ordered Stop   02/21/13 2359  levofloxacin (LEVAQUIN) IVPB 750 mg  Status:  Discontinued     750 mg 100 mL/hr over 90 Minutes Intravenous Every 24 hours 02/21/13 0658 02/21/13 1302   02/21/13 2000  vancomycin (VANCOCIN) IVPB 1000 mg/200 mL premix  Status:  Discontinued     1,000 mg 200 mL/hr over 60 Minutes Intravenous Every 12 hours 02/21/13 0708 02/21/13 1535   02/21/13 1400  imipenem-cilastatin (PRIMAXIN) 500 mg in sodium chloride 0.9 % 100 mL IVPB     500 mg 200 mL/hr over 30 Minutes Intravenous Every 6 hours 02/21/13 1318     02/21/13 0800  vancomycin (VANCOCIN) IVPB 1000 mg/200 mL premix     1,000 mg 200 mL/hr over 60 Minutes Intravenous  Once 02/21/13 0659 02/21/13 0941   02/21/13 0430  vancomycin (VANCOCIN) IVPB 1000 mg/200 mL premix     1,000 mg 200 mL/hr over 60 Minutes Intravenous  Once 02/21/13 0429 02/21/13 0624   02/21/13 0100  levofloxacin (LEVAQUIN) IVPB 750 mg     750 mg 100 mL/hr over 90 Minutes Intravenous  Once 02/21/13 0052 02/21/13 0417      Medications: Scheduled Meds: . enoxaparin (LOVENOX) injection  40 mg Subcutaneous Q24H  . imipenem-cilastatin  500 mg Intravenous Q6H  . metoCLOPramide  5 mg Oral TID AC   Continuous Infusions: . sodium chloride 75 mL/hr at 02/21/13 1343   PRN Meds:.baclofen, sodium chloride   Objective: Weight change:   Intake/Output Summary (Last 24 hours) at 02/22/13 1520 Last data filed at 02/22/13 1100  Gross per 24 hour  Intake    900 ml  Output    300 ml  Net    600 ml   Blood pressure 123/84, pulse 83, temperature 98.1 F (36.7 C), temperature source Oral, resp. rate 18, SpO2 99.00%. Temp:  [98.1 F (36.7 C)-100 F (37.8 C)] 98.1 F (36.7 C) (10/10 1443) Pulse Rate:  [83-100] 83 (10/10 1443) Resp:  [15-25] 18 (10/10 1443) BP: (112-144)/(71-105)  123/84 mmHg (10/10 1443) SpO2:  [96 %-100 %] 99 % (10/10 1443)  Physical Exam: General: Alert and awake, oriented x3, not in any acute distress.  HEENT: anicteric sclera, pupils reactive to light and accommodation, EOMI, oropharynx clear and without exudate  CVS regular rate, normal r, no murmur rubs or gallops  Chest: clear to auscultation bilaterally, no wheezing, rales or rhonchi  Abdomen: soft nontender, nondistended, normal bowel sounds ostomy with stool  Extremities/skin: drain in place with purulent material filling this   Lab Results:  Recent Labs  02/20/13 2358 02/22/13 0510  WBC 19.4* 16.8*  HGB 7.6* 7.0*  HCT 25.4* 23.4*  PLT 671* 627*    BMET  Recent Labs  02/20/13 2358 02/22/13 0510  NA 138 140  K 3.2* 3.2*  CL 101 103  CO2 27 27  GLUCOSE 120* 110*  BUN 14 11  CREATININE 0.41* 0.50  CALCIUM 8.8 8.5    Micro Results: Recent Results (from the past 240 hour(s))  URINE CULTURE     Status: None   Collection Time    02/20/13  7:34 PM      Result Value Range Status   Specimen Description URINE, CATHETERIZED   Final   Special Requests NONE   Final   Culture  Setup Time  Final   Value: 02/21/2013 02:28     Performed at [facility] Count     Final   Value: >=100,000 COLONIES/ML     Performed at [facility]   Culture     Final   Value: GRAM NEGATIVE RODS     Performed at [facility]   Report Status PENDING   Incomplete  MRSA PCR SCREENING     Status: None   Collection Time    02/21/13  1:41 PM      Result Value Range Status   MRSA by PCR NEGATIVE  NEGATIVE Final   Comment:            The GeneXpert MRSA Assay (FDA     approved for NASAL specimens     only), is one component of a     comprehensive MRSA colonization     surveillance program. It is not     intended to diagnose MRSA     infection nor to guide or     monitor treatment for     MRSA infections.    Studies/Results: Ct Pelvis W  Contrast  02/21/2013   *RADIOLOGY REPORT*  Clinical Data:  Poorly healing bilateral sacral wounds; evaluate wounds.  CT PELVIS WITH CONTRAST  Technique:  Multidetector CT imaging of the pelvis was performed using the standard protocol following the bolus administration of intravenous contrast.  Contrast: OMNIPAQUE IOHEXOL 300 MG/ML  SOLN  Comparison:  CT of the pelvis performed 01/25/2013  Findings:  Since the prior study, there has been interval development of a somewhat complex peripherally enhancing abscess within the anterior left thigh, measuring approximately 6.8 x 3.6 x 5.9 cm.  The degree of soft tissue inflammation within the proximal left thigh has also increased, reflecting cellulitis.  Note is also made of new tiny foci of air at the left hip joint space, compatible with new extension of the patient's left-sided sacral decubitus ulcers; the degree of soft tissue inflammation at the left hip is largely unchanged, though more peripheral surrounding soft tissue edema appears worsened.  The right hip joint is unchanged in appearance, with a large sacral decubitus ulceration tracking through the hip joint and into a large pocket of air along the anterior right thigh.  There is diffuse destruction of both hip joints, with extensive deformity as previously described.  An unusual osseous extension is again noted arising from the right side of the pubic symphysis, into the anterior right thigh; this appears to reflect a displaced coccyx.  Decubitus ulcerations along both posterior acetabula extend to the bone, as noted on the prior study.  The decubitus ulceration at the sacrum is also unchanged, extending nearly to the bone.  The visualized small and large bowel are grossly unremarkable in appearance.  The bladder is decompressed, with a suprapubic catheter noted in unchanged position.  IMPRESSION:  1.  New 6.8 x 3.6 x 5.9 cm peripherally enhancing abscess in the anterior left thigh, with new cellulitis  involving much of the left thigh. 2.  New tiny foci of air noted at the left hip joint space, compatible with new extension of the left-sided sacral decubitus ulcers; surrounding soft tissue edema appears worsened. 3.  Very large right-sided sacral decubitus ulceration unchanged in appearance, with air tracking through the right hip joint and into the anterior right thigh.  Decubitus ulcerations along both posterior acetabula and at the sacrum are essentially unchanged.  These results were called by telephone  on 02/21/2013 at 02:10 a.m. to the clinical team at [facility], who verbally acknowledged these results.   Original Report Authenticated By: Tonia Ghent, M.D.      Assessment/Plan: [name] [name] is a 47 y.o. male with  with chronic sacral decubitus ulcer, right hip infection, now left thigh abscess and hip infection   #1 Left thigh abscess and hip joint infection now sp Drain by IR:  --continue imipenem for now --fu culture data     #2 ? UTI:  >100K GNR, Per lab could be KPC but is sensitive to mx other abx so seems not terribly likely  Dr. Luciana Axe is available this weekend for questions.    LOS: 2 days   Acey Lav 02/22/2013, 3:20 PM

## 2013-02-22 NOTE — ED Provider Notes (Signed)
Medical screening examination/treatment/procedure(s) were performed by non-physician practitioner and as supervising physician I was immediately available for consultation/collaboration.  Shanna Cisco, MD 02/22/13 [phone]

## 2013-02-22 NOTE — Consult Note (Signed)
Agree 

## 2013-02-22 NOTE — Procedures (Signed)
Procedure:  Ultrasound guided drainage of left thigh abscess Findings:  Purulent fluid aspirated.  Sample sent for culture. 10 Fr perc drain placed and connected to bulb suction.

## 2013-02-23 NOTE — Progress Notes (Signed)
Placed on nocturnal CPAP 19cmH2O per the patients home pressure.  Patient stated that the mask fit was  comfortable.

## 2013-02-23 NOTE — Progress Notes (Signed)
[facility] PROGRESS NOTE  [name] [name] ZOX:[id number] DOB: 12-20-1965 DOA: 02/20/2013 PCP: [name], MD  Assessment/Plan:  1. 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 Chronic indwelling foley -UA abnormal appearing difficult to differentiate UTI vs Colonization since pt has no sensations -currently on PRimaxin, U CX with GNR  4. Quadriplegia  -following C5 fracture (1991)  5. Severe protein calorie malnutrition -albumin 1.7, Nutrition consult  6. Reactive thrombocytosis  7. Anemia from ongoing oozing and blood loss from large decub -suspect will need transfusion tomorrow -CBC pending this am  Code Status: Full Family Communication: none at bedside Disposition Plan: to be determined   Consultants:  CCS  Orthopedics  Antibiotics:  Vanc  LEvaquin  HPI/Subjective: denies any complaints Objective: Filed Vitals:   02/23/13 0623  BP: 143/82  Pulse: 89  Temp: 98.1 F (36.7 C)  Resp: 18    Intake/Output Summary (Last 24 hours) at 02/23/13 1023 Last data filed at 02/23/13 0618  Gross per 24 hour  Intake   1825 ml  Output   1841 ml  Net    -16 ml   There were no vitals filed for this visit.  Exam:   General:  AAOx3,  no distress  Cardiovascular:S1S2/RRR  Respiratory: CTAB  Abdomen: soft, Nt, obese, BS present  Musculoskeletal: contractures involving arms and legs  Tightness and fullness in L thigh  Quadriplegic  Data Reviewed: Basic Metabolic Panel:  Recent Labs Lab 02/20/13 2358 02/22/13 0510  NA 138 140  K 3.2* 3.2*  CL 101 103  CO2 27 27  GLUCOSE 120* 110*  BUN 14 11  CREATININE 0.41* 0.50  CALCIUM 8.8 8.5   Liver Function Tests: No results found for this basename: AST, ALT, ALKPHOS, BILITOT, PROT, ALBUMIN,  in the last 168 hours No results found for this basename: LIPASE, AMYLASE,  in the last 168 hours No results found for this basename: AMMONIA,  in the last 168 hours CBC:  Recent Labs Lab 02/20/13 2358 02/22/13 0510  WBC 19.4* 16.8*  NEUTROABS 14.4*  --   HGB 7.6* 7.0*  HCT 25.4* 23.4*  MCV 74.5* 75.0*  PLT 671* 627*   Cardiac Enzymes: No results found for this basename: CKTOTAL, CKMB, CKMBINDEX, TROPONINI,  in the last 168 hours BNP (last 3 results) No results found for this basename: PROBNP,  in the last 8760 hours CBG: No results found for this basename: GLUCAP,  in the last 168 hours  Recent Results (from the past 240 hour(s))  URINE CULTURE     Status: None   Collection Time    02/20/13  7:34 PM      Result Value Range Status   Specimen Description URINE, CATHETERIZED   Final   Special Requests NONE   Final   Culture  Setup Time  Final   Value: 02/21/2013 02:28     Performed at [facility] Count     Final   Value: >=100,000 COLONIES/ML     Performed at [facility]   Culture     Final   Value: GRAM NEGATIVE RODS     Performed at [facility]   Report Status PENDING   Incomplete  MRSA PCR SCREENING     Status: None   Collection Time    02/21/13  1:41 PM      Result Value Range Status   MRSA by PCR NEGATIVE  NEGATIVE Final   Comment:            The GeneXpert MRSA Assay (FDA     approved for NASAL specimens      only), is one component of a     comprehensive MRSA colonization     surveillance program. It is not     intended to diagnose MRSA     infection nor to guide or     monitor treatment for     MRSA infections.  CULTURE, ROUTINE-ABSCESS     Status: None   Collection Time    02/22/13  1:21 PM      Result Value Range Status   Specimen Description ABSCESS LEFT THIGH   Final   Special Requests Normal   Final   Gram Stain PENDING   Incomplete   Culture     Final   Value: Culture reincubated for better growth     Performed at [facility]   Report Status PENDING   Incomplete     Studies: [place] Abscess Drain  02/22/2013   CLINICAL DATA:  Quadriplegia and development of abscess in the anterior left thigh.  EXAM: ULTRASOUND GUIDED DRAINAGE OF THE LEFT THIGHS SOFT TISSUE ABSCESS WITH PLACEMENT OF INDWELLING CATHETER  MEDICATIONS: No IV conscious sedation was administered.  PROCEDURE: The procedure, risks, benefits, and alternatives were explained to the patient. Questions regarding the procedure were encouraged and answered. The patient understands and consents to the procedure.  The left thigh was prepped with Betadine in a sterile fashion, and a sterile drape was applied covering the operative field. A sterile gown and sterile gloves were used for the procedure. Local anesthesia was provided with 1% Lidocaine.  Ultrasound was used to localize an anterior left thigh abscess. Under ultrasound guidance, an 18 gauge trocar needle was advanced into the collection. Aspiration of fluid was performed and a sample sent for culture.  A guidewire was advanced through the needle. The needle was removed and the percutaneous tract dilated over the wire. A 10 French pigtail drainage catheter was advanced into the collection. The catheter was formed and flushed with saline. It was then connected to a suction bulb. The catheter was secured at the skin with a Prolene retention suture and adhesive stat lock  device.  COMPLICATIONS: None.  FINDINGS: Ultrasound demonstrates an elongated and complex abscess of the anterior left thigh. Aspiration yielded grossly purulent fluid. After placement of the drainage catheter, there was immediate return of a copious amount of purulent fluid.  IMPRESSION: Ultrasound-guided percutaneous drainage of left thigh soft tissue abscess. A 10 French drain was placed and attached to suction bulb drainage. A fluid sample was sent for culture analysis.   Electronically Signed   By: Irish Lack M.D.   On: 02/22/2013 16:36    Scheduled Meds: . enoxaparin (LOVENOX) injection  40 mg Subcutaneous Q24H  .  imipenem-cilastatin  500 mg Intravenous Q6H  . metoCLOPramide  5 mg Oral TID AC   Continuous Infusions: . sodium chloride 75 mL/hr at 02/23/13 1610    Principal Problem:   Cellulitis and abscess of leg Active Problems:   Quadriplegia   Hyponatremia   Seizure disorder   Sacral decubitus ulcer, stage IV    Time spent:    [facility]  [facility] Pager [phone]. If 7PM-7AM, please contact night-coverage at [email or web address], password [facility] 02/23/2013, 10:23 AM  LOS: 3 days

## 2013-02-23 NOTE — Progress Notes (Signed)
1 Day Post-Op  Subjective: L anterior thigh abscess drain placed 10/10 Pt doing well No complaints  Objective: Vital signs in last 24 hours: Temp:  [98 F (36.7 C)-99.3 F (37.4 C)] 98.1 F (36.7 C) (10/11 0623) Pulse Rate:  [83-100] 89 (10/11 0623) Resp:  [15-25] 18 (10/11 0623) BP: (102-144)/(57-105) 143/82 mmHg (10/11 0623) SpO2:  [96 %-100 %] 100 % (10/11 0623) Last BM Date: 02/23/13  Intake/Output from previous day: 10/10 0701 - 10/11 0700 In: 1925 [I.V.:1725; IV Piggyback:200] Out: 1841 [Urine:1650; Drains:190; Stool:1] Intake/Output this shift:    PE:  Afeb; VSS Drain intact Site clean and dry Output 190 cc since placement Output pus and bloody Cx pending  Lab Results:   Recent Labs  02/20/13 2358 02/22/13 0510  WBC 19.4* 16.8*  HGB 7.6* 7.0*  HCT 25.4* 23.4*  PLT 671* 627*   BMET  Recent Labs  02/20/13 2358 02/22/13 0510  NA 138 140  K 3.2* 3.2*  CL 101 103  CO2 27 27  GLUCOSE 120* 110*  BUN 14 11  CREATININE 0.41* 0.50  CALCIUM 8.8 8.5   PT/INR No results found for this basename: LABPROT, INR,  in the last 72 hours ABG No results found for this basename: PHART, PCO2, PO2, HCO3,  in the last 72 hours  Studies/Results: [place] Abscess Drain  02/22/2013   CLINICAL DATA:  Quadriplegia and development of abscess in the anterior left thigh.  EXAM: ULTRASOUND GUIDED DRAINAGE OF THE LEFT THIGHS SOFT TISSUE ABSCESS WITH PLACEMENT OF INDWELLING CATHETER  MEDICATIONS: No IV conscious sedation was administered.  PROCEDURE: The procedure, risks, benefits, and alternatives were explained to the patient. Questions regarding the procedure were encouraged and answered. The patient understands and consents to the procedure.  The left thigh was prepped with Betadine in a sterile fashion, and a sterile drape was applied covering the operative field. A sterile gown and sterile gloves were used for the procedure. Local anesthesia was provided with 1% Lidocaine.   Ultrasound was used to localize an anterior left thigh abscess. Under ultrasound guidance, an 18 gauge trocar needle was advanced into the collection. Aspiration of fluid was performed and a sample sent for culture.  A guidewire was advanced through the needle. The needle was removed and the percutaneous tract dilated over the wire. A 10 French pigtail drainage catheter was advanced into the collection. The catheter was formed and flushed with saline. It was then connected to a suction bulb. The catheter was secured at the skin with a Prolene retention suture and adhesive stat lock device.  COMPLICATIONS: None.  FINDINGS: Ultrasound demonstrates an elongated and complex abscess of the anterior left thigh. Aspiration yielded grossly purulent fluid. After placement of the drainage catheter, there was immediate return of a copious amount of purulent fluid.  IMPRESSION: Ultrasound-guided percutaneous drainage of left thigh soft tissue abscess. A 10 French drain was placed and attached to suction bulb drainage. A fluid sample was sent for culture analysis.   Electronically Signed   By: Irish Lack M.D.   On: 02/22/2013 16:36    Anti-infectives: Anti-infectives   Start     Dose/Rate Route Frequency Ordered Stop   02/21/13 2359  levofloxacin (LEVAQUIN) IVPB 750 mg  Status:  Discontinued     750 mg 100 mL/hr over 90 Minutes Intravenous Every 24 hours 02/21/13 0658 02/21/13 1302   02/21/13 2000  vancomycin (VANCOCIN) IVPB 1000 mg/200 mL premix  Status:  Discontinued     1,000 mg 200 mL/hr  over 60 Minutes Intravenous Every 12 hours 02/21/13 0708 02/21/13 1535   02/21/13 1400  imipenem-cilastatin (PRIMAXIN) 500 mg in sodium chloride 0.9 % 100 mL IVPB     500 mg 200 mL/hr over 30 Minutes Intravenous Every 6 hours 02/21/13 1318     02/21/13 0800  vancomycin (VANCOCIN) IVPB 1000 mg/200 mL premix     1,000 mg 200 mL/hr over 60 Minutes Intravenous  Once 02/21/13 0659 02/21/13 0941   02/21/13 0430  vancomycin  (VANCOCIN) IVPB 1000 mg/200 mL premix     1,000 mg 200 mL/hr over 60 Minutes Intravenous  Once 02/21/13 0429 02/21/13 0624   02/21/13 0100  levofloxacin (LEVAQUIN) IVPB 750 mg     750 mg 100 mL/hr over 90 Minutes Intravenous  Once 02/21/13 0052 02/21/13 0417      Assessment/Plan: s/p Procedure(s): IRRIGATION AND DEBRIDEMENT LEFT LEG ABSCESS (Left)   Left ant thigh abscess Drain intact Will follow   LOS: 3 days    Buna Cuppett A 02/23/2013

## 2013-02-24 LAB — URINE CULTURE

## 2013-02-24 MED ORDER — BACLOFEN 20 MG PO TABS
20.0000 mg | ORAL_TABLET | Freq: Four times a day (QID) | ORAL | Status: DC
  Administered 2013-02-25 – 2013-03-02 (×22): 20 mg via ORAL
  Filled 2013-02-24 (×26): qty 1

## 2013-02-24 MED ORDER — ZINC SULFATE 220 (50 ZN) MG PO CAPS
220.0000 mg | ORAL_CAPSULE | Freq: Every day | ORAL | Status: DC
  Administered 2013-02-24 – 2013-03-02 (×7): 220 mg via ORAL
  Filled 2013-02-24 (×7): qty 1

## 2013-02-24 MED ORDER — VITAMIN C 500 MG PO TABS
500.0000 mg | ORAL_TABLET | Freq: Every day | ORAL | Status: DC
  Administered 2013-02-24 – 2013-03-02 (×7): 500 mg via ORAL
  Filled 2013-02-24 (×7): qty 1

## 2013-02-24 MED ORDER — DOCUSATE SODIUM 100 MG PO CAPS
100.0000 mg | ORAL_CAPSULE | Freq: Two times a day (BID) | ORAL | Status: DC
  Administered 2013-02-24 – 2013-03-02 (×12): 100 mg via ORAL

## 2013-02-24 MED ORDER — ADULT MULTIVITAMIN W/MINERALS CH
1.0000 | ORAL_TABLET | Freq: Every day | ORAL | Status: DC
  Administered 2013-02-24 – 2013-03-02 (×7): 1 via ORAL
  Filled 2013-02-24 (×7): qty 1

## 2013-02-24 NOTE — Progress Notes (Signed)
[facility] PROGRESS NOTE  [name] [name] WUX:[id number] DOB: [birth year] DOA: 02/20/2013 PCP: [name], MD  Assessment/Plan:  1. L anterior thigh abscess with large decubitus wounds and some air tracking to R and L Hip -Off IV Vanc, currently only on Primaxin -CCS signed off -ORtho Consult per Dr.Xu appreciated -s/p IR drain of  thigh abscess 10/10, IR following, does he need repeat Imaging -Wound Cx with moderate GNR, continue PRimaxin -ID following   2. 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 Chronic indwelling foley -UA abnormal appearing difficult to differentiate UTI vs Colonization since pt has no sensations -currently on PRimaxin, U CX with GNR  4. Quadriplegia  -following C5 fracture (1991)  5. Severe protein calorie malnutrition -albumin 1.7, Nutrition consult  6. Reactive thrombocytosis  7. Anemia from ongoing oozing and blood loss from large decub -suspect will need transfusion at some point soon -CBC in am  Code Status: Full Family Communication: none at bedside Disposition Plan: Pt declines SNf at this time   Consultants:  CCS  Orthopedics  Antibiotics:  Vanc  LEvaquin  HPI/Subjective: denies any complaints Objective: Filed Vitals:   02/24/13 0617  BP: 158/88  Pulse: 94  Temp: 97.9 F (36.6 C)  Resp: 22    Intake/Output Summary (Last 24 hours) at 02/24/13 1109 Last data filed at 02/24/13 2536  Gross per 24 hour  Intake   3345 ml  Output   2908 ml  Net    437 ml   There were no  vitals filed for this visit.  Exam:   General:  AAOx3, no distress  Cardiovascular:S1S2/RRR  Respiratory: CTAB  Abdomen: soft, Nt, obese, BS present  Musculoskeletal: contractures involving arms and legs  Tightness and fullness in L thigh  Quadriplegic  Data Reviewed: Basic Metabolic Panel:  Recent Labs Lab 02/20/13 2358 02/22/13 0510  NA 138 140  K 3.2* 3.2*  CL 101 103  CO2 27 27  GLUCOSE 120* 110*  BUN 14 11  CREATININE 0.41* 0.50  CALCIUM 8.8 8.5   Liver Function Tests: No results found for this basename: AST, ALT, ALKPHOS, BILITOT, PROT, ALBUMIN,  in the last 168 hours No results found for this basename: LIPASE, AMYLASE,  in the last 168 hours No results found for this basename: AMMONIA,  in the last 168 hours CBC:  Recent Labs Lab 02/20/13 2358 02/22/13 0510  WBC 19.4* 16.8*  NEUTROABS 14.4*  --   HGB 7.6* 7.0*  HCT 25.4* 23.4*  MCV 74.5* 75.0*  PLT 671* 627*   Cardiac Enzymes: No results found for this basename: CKTOTAL, CKMB, CKMBINDEX, TROPONINI,  in the last 168 hours BNP (last 3 results) No results found for this basename: PROBNP,  in the last 8760 hours CBG: No results found for this basename: GLUCAP,  in the last 168 hours  Recent Results (from the past 240 hour(s))  URINE CULTURE     Status: None   Collection Time    02/20/13  7:34 PM      Result Value Range Status   Specimen Description URINE, CATHETERIZED   Final   Special Requests  NONE   Final   Culture  Setup Time     Final   Value: 02/21/2013 02:28     Performed at [facility] Count     Final   Value: >=100,000 COLONIES/ML     Performed at [facility]   Culture     Final   Value: GRAM NEGATIVE RODS     Performed at [facility]   Report Status PENDING   Incomplete  MRSA PCR SCREENING     Status: None   Collection Time    02/21/13  1:41 PM      Result Value Range Status   MRSA by PCR NEGATIVE  NEGATIVE Final   Comment:            The  GeneXpert MRSA Assay (FDA     approved for NASAL specimens     only), is one component of a     comprehensive MRSA colonization     surveillance program. It is not     intended to diagnose MRSA     infection nor to guide or     monitor treatment for     MRSA infections.  CULTURE, ROUTINE-ABSCESS     Status: None   Collection Time    02/22/13  1:21 PM      Result Value Range Status   Specimen Description ABSCESS LEFT THIGH   Final   Special Requests Normal   Final   Gram Stain     Final   Value: ABUNDANT WBC PRESENT,BOTH PMN AND MONONUCLEAR     NO SQUAMOUS EPITHELIAL CELLS SEEN     NO ORGANISMS SEEN     Performed at [facility]   Culture     Final   Value: MODERATE GRAM NEGATIVE RODS     Performed at [facility]   Report Status PENDING   Incomplete  ANAEROBIC CULTURE     Status: None   Collection Time    02/22/13  1:21 PM      Result Value Range Status   Specimen Description ABSCESS LEFT THIGH   Final   Special Requests Normal   Final   Gram Stain     Final   Value: ABUNDANT WBC PRESENT,BOTH PMN AND MONONUCLEAR     NO SQUAMOUS EPITHELIAL CELLS SEEN     NO ORGANISMS SEEN     Performed at [facility]   Culture     Final   Value: NO ANAEROBES ISOLATED; CULTURE IN PROGRESS FOR 5 DAYS     Performed at [facility]   Report Status PENDING   Incomplete     Studies: [place] Abscess Drain  02/22/2013   CLINICAL DATA:  Quadriplegia and development of abscess in the anterior left thigh.  EXAM: ULTRASOUND GUIDED DRAINAGE OF THE LEFT THIGHS SOFT TISSUE ABSCESS WITH PLACEMENT OF INDWELLING CATHETER  MEDICATIONS: No IV conscious sedation was administered.  PROCEDURE: The procedure, risks, benefits, and alternatives were explained to the patient. Questions regarding the procedure were encouraged and answered. The patient understands and consents to the procedure.  The left thigh was prepped with Betadine in a sterile fashion, and a sterile drape was applied  covering the operative field. A sterile gown and sterile gloves were used for the procedure. Local anesthesia was provided with 1% Lidocaine.  Ultrasound was used to localize an anterior left thigh abscess. Under ultrasound guidance, an 18 gauge trocar needle was advanced into the collection. Aspiration  of fluid was performed and a sample sent for culture.  A guidewire was advanced through the needle. The needle was removed and the percutaneous tract dilated over the wire. A 10 French pigtail drainage catheter was advanced into the collection. The catheter was formed and flushed with saline. It was then connected to a suction bulb. The catheter was secured at the skin with a Prolene retention suture and adhesive stat lock device.  COMPLICATIONS: None.  FINDINGS: Ultrasound demonstrates an elongated and complex abscess of the anterior left thigh. Aspiration yielded grossly purulent fluid. After placement of the drainage catheter, there was immediate return of a copious amount of purulent fluid.  IMPRESSION: Ultrasound-guided percutaneous drainage of left thigh soft tissue abscess. A 10 French drain was placed and attached to suction bulb drainage. A fluid sample was sent for culture analysis.   Electronically Signed   By: Irish Lack M.D.   On: 02/22/2013 16:36    Scheduled Meds: . docusate sodium  100 mg Oral BID  . enoxaparin (LOVENOX) injection  40 mg Subcutaneous Q24H  . imipenem-cilastatin  500 mg Intravenous Q6H  . metoCLOPramide  5 mg Oral TID AC  . multivitamin with minerals  1 tablet Oral Daily  . vitamin C  500 mg Oral Daily  . zinc sulfate  220 mg Oral Daily   Continuous Infusions: . sodium chloride 20 mL/hr at 02/24/13 1003    Principal Problem:   Cellulitis and abscess of leg Active Problems:   Quadriplegia   Hyponatremia   Seizure disorder   Sacral decubitus ulcer, stage IV    Time spent:    [facility]  [facility] Pager [phone]. If 7PM-7AM, please  contact night-coverage at [email or web address], password [facility] 02/24/2013, 11:09 AM  LOS: 4 days

## 2013-02-24 NOTE — Progress Notes (Signed)
2 Days Post-Op  Subjective: L ant thigh abscess drain placed 10/10 Good output  Objective: Vital signs in last 24 hours: Temp:  [97.9 F (36.6 C)-99.2 F (37.3 C)] 97.9 F (36.6 C) (10/12 0617) Pulse Rate:  [94-102] 94 (10/12 0617) Resp:  [18-22] 22 (10/12 0617) BP: (125-158)/(75-88) 158/88 mmHg (10/12 0617) SpO2:  [97 %-100 %] 99 % (10/12 0617) Last BM Date: 02/23/13  Intake/Output from previous day: 10/11 0701 - 10/12 0700 In: 3475 [P.O.:1320; I.V.:1725; IV Piggyback:400] Out: 2908 [Urine:2800; Drains:108] Intake/Output this shift:    PE:  Afeb; VSS Output 110 cc yesterday Bloody/ pus Cx: G- rods Wbc 16.8 (19.4) Site clean and dry  Lab Results:   Recent Labs  02/22/13 0510  WBC 16.8*  HGB 7.0*  HCT 23.4*  PLT 627*   BMET  Recent Labs  02/22/13 0510  NA 140  K 3.2*  CL 103  CO2 27  GLUCOSE 110*  BUN 11  CREATININE 0.50  CALCIUM 8.5   PT/INR No results found for this basename: LABPROT, INR,  in the last 72 hours ABG No results found for this basename: PHART, PCO2, PO2, HCO3,  in the last 72 hours  Studies/Results: [place] Abscess Drain  02/22/2013   CLINICAL DATA:  Quadriplegia and development of abscess in the anterior left thigh.  EXAM: ULTRASOUND GUIDED DRAINAGE OF THE LEFT THIGHS SOFT TISSUE ABSCESS WITH PLACEMENT OF INDWELLING CATHETER  MEDICATIONS: No IV conscious sedation was administered.  PROCEDURE: The procedure, risks, benefits, and alternatives were explained to the patient. Questions regarding the procedure were encouraged and answered. The patient understands and consents to the procedure.  The left thigh was prepped with Betadine in a sterile fashion, and a sterile drape was applied covering the operative field. A sterile gown and sterile gloves were used for the procedure. Local anesthesia was provided with 1% Lidocaine.  Ultrasound was used to localize an anterior left thigh abscess. Under ultrasound guidance, an 18 gauge trocar needle was  advanced into the collection. Aspiration of fluid was performed and a sample sent for culture.  A guidewire was advanced through the needle. The needle was removed and the percutaneous tract dilated over the wire. A 10 French pigtail drainage catheter was advanced into the collection. The catheter was formed and flushed with saline. It was then connected to a suction bulb. The catheter was secured at the skin with a Prolene retention suture and adhesive stat lock device.  COMPLICATIONS: None.  FINDINGS: Ultrasound demonstrates an elongated and complex abscess of the anterior left thigh. Aspiration yielded grossly purulent fluid. After placement of the drainage catheter, there was immediate return of a copious amount of purulent fluid.  IMPRESSION: Ultrasound-guided percutaneous drainage of left thigh soft tissue abscess. A 10 French drain was placed and attached to suction bulb drainage. A fluid sample was sent for culture analysis.   Electronically Signed   By: Irish Lack M.D.   On: 02/22/2013 16:36    Anti-infectives: Anti-infectives   Start     Dose/Rate Route Frequency Ordered Stop   02/21/13 2359  levofloxacin (LEVAQUIN) IVPB 750 mg  Status:  Discontinued     750 mg 100 mL/hr over 90 Minutes Intravenous Every 24 hours 02/21/13 0658 02/21/13 1302   02/21/13 2000  vancomycin (VANCOCIN) IVPB 1000 mg/200 mL premix  Status:  Discontinued     1,000 mg 200 mL/hr over 60 Minutes Intravenous Every 12 hours 02/21/13 0708 02/21/13 1535   02/21/13 1400  imipenem-cilastatin (PRIMAXIN) 500 mg  in sodium chloride 0.9 % 100 mL IVPB     500 mg 200 mL/hr over 30 Minutes Intravenous Every 6 hours 02/21/13 1318     02/21/13 0800  vancomycin (VANCOCIN) IVPB 1000 mg/200 mL premix     1,000 mg 200 mL/hr over 60 Minutes Intravenous  Once 02/21/13 0659 02/21/13 0941   02/21/13 0430  vancomycin (VANCOCIN) IVPB 1000 mg/200 mL premix     1,000 mg 200 mL/hr over 60 Minutes Intravenous  Once 02/21/13 0429 02/21/13  0624   02/21/13 0100  levofloxacin (LEVAQUIN) IVPB 750 mg     750 mg 100 mL/hr over 90 Minutes Intravenous  Once 02/21/13 0052 02/21/13 0417      Assessment/Plan: s/p Procedure(s): IRRIGATION AND DEBRIDEMENT LEFT LEG ABSCESS (Left)  L thigh drain intact Output good Will follow   LOS: 4 days    Anjenette Gerbino A 02/24/2013

## 2013-02-24 NOTE — Progress Notes (Signed)
ANTIBIOTIC CONSULT NOTE - FOLLOW UP  Pharmacy Consult for Primaxin Indication: Chronic osteomyelitis of pelvis; thigh abscess  Allergies  Allergen Reactions  . Ditropan [Oxybutynin] Other (See Comments)    hallucinations    Patient Measurements: On 01/25/13: Wt 104.8kg Ht 72 inches  Vital Signs: Temp: 97.9 F (36.6 C) (10/12 0617) Temp src: Axillary (10/12 0617) BP: 158/88 mmHg (10/12 0617) Pulse Rate: 94 (10/12 0617) Intake/Output from previous day: 10/11 0701 - 10/12 0700 In: 3475 [P.O.:1320; I.V.:1725; IV Piggyback:400] Out: 2908 [Urine:2800; Drains:108] Intake/Output from this shift: Total I/O In: 480 [P.O.:480] Out: -   Labs:  Recent Labs  02/22/13 0510  WBC 16.8*  HGB 7.0*  PLT 627*  CREATININE 0.50   CrCl calculates to > 100 mL/min/72 kg, but may be an overestimate in setting of quadriplegia.  Microbiology: Recent Results (from the past 720 hour(s))  CLOSTRIDIUM DIFFICILE BY PCR     Status: None   Collection Time    01/26/13  1:45 AM      Result Value Range Status   C difficile by pcr NEGATIVE  NEGATIVE Final   Comment: Performed at [facility]  CULTURE, BLOOD (ROUTINE X 2)     Status: None   Collection Time    01/27/13  5:30 AM      Result Value Range Status   Specimen Description BLOOD RIGHT ARM   Final   Special Requests     Final   Value: BOTTLES DRAWN AEROBIC AND ANAEROBIC 3 CC BLUE,2 CC RED   Culture  Setup Time     Final   Value: 01/27/2013 15:09     Performed at [facility]   Culture     Final   Value: NO GROWTH 5 DAYS     Performed at [facility]   Report Status 02/04/2013 FINAL   Final  CULTURE, BLOOD (ROUTINE X 2)     Status: None   Collection Time    01/27/13  5:35 AM      Result Value Range Status   Specimen Description BLOOD RIGHT HAND   Final   Special Requests BOTTLES DRAWN AEROBIC AND ANAEROBIC 2 CC EA   Final   Culture  Setup Time     Final   Value: 01/27/2013 14:58     Performed at [facility]   Culture     Final   Value: NO GROWTH 5 DAYS     Performed at [facility]   Report Status 02/04/2013 FINAL   Final  BODY FLUID CULTURE     Status: None   Collection Time    01/28/13  5:28 PM      Result Value Range Status   Specimen Description OTHER   Final   Special Requests Normal   Final   Gram Stain     Final   Value: NO WBC SEEN     NO ORGANISMS SEEN     Performed at [facility]   Culture     Final   Value: MODERATE ENTEROBACTER CLOACAE     Performed at [facility]   Report Status 02/02/2013 FINAL   Final   Organism ID, Bacteria ENTEROBACTER CLOACAE   Final  URINE CULTURE     Status: None   Collection Time    02/20/13  7:34 PM      Result Value Range Status   Specimen Description URINE, CATHETERIZED   Final   Special Requests NONE  Final   Culture  Setup Time     Final   Value: 02/21/2013 02:28     Performed at [facility] Count     Final   Value: >=100,000 COLONIES/ML     Performed at [facility]   Culture     Final   Value: GRAM NEGATIVE RODS     Performed at [facility]   Report Status PENDING   Incomplete  MRSA PCR SCREENING     Status: None   Collection Time    02/21/13  1:41 PM      Result Value Range Status   MRSA by PCR NEGATIVE  NEGATIVE Final   Comment:            The GeneXpert MRSA Assay (FDA     approved for NASAL specimens     only), is one component of a     comprehensive MRSA colonization     surveillance program. It is not     intended to diagnose MRSA     infection nor to guide or     monitor treatment for     MRSA infections.  CULTURE, ROUTINE-ABSCESS     Status: None   Collection Time    02/22/13  1:21 PM      Result Value Range Status   Specimen Description ABSCESS LEFT THIGH   Final   Special Requests Normal   Final   Gram Stain     Final   Value: ABUNDANT WBC PRESENT,BOTH PMN AND MONONUCLEAR     NO SQUAMOUS EPITHELIAL CELLS SEEN     NO ORGANISMS SEEN      Performed at [facility]   Culture     Final   Value: MODERATE GRAM NEGATIVE RODS     Performed at [facility]   Report Status PENDING   Incomplete  ANAEROBIC CULTURE     Status: None   Collection Time    02/22/13  1:21 PM      Result Value Range Status   Specimen Description ABSCESS LEFT THIGH   Final   Special Requests Normal   Final   Gram Stain     Final   Value: ABUNDANT WBC PRESENT,BOTH PMN AND MONONUCLEAR     NO SQUAMOUS EPITHELIAL CELLS SEEN     NO ORGANISMS SEEN     Performed at [facility]   Culture     Final   Value: NO ANAEROBES ISOLATED; CULTURE IN PROGRESS FOR 5 DAYS     Performed at [facility]   Report Status PENDING   Incomplete    Anti-infectives: 10/9 >>levaquin >>10/9 10/9 >>vancomcyin >> 10/9 10/9>>imipenem  Assessment: 47 y/o M with chronic osteomyelitis of pelvis and new thigh abscess. IR drain of abscess 10/10, cultures growing GNR.  Tmax 99.2  WBC remain elevated as of 10/10 - rechecking in am  SCr low/stable, CrCl estimated at 100 ml/min but may be an overestimate due to quadriplegia  History of seizure disorder noted; reportedly last seizure occurred in 1991. Primaxin has been associated with seizures but most case reports involve administration of very large dosages.  Goal of Therapy:  Dose per weight and renal function  Plan:   Continue Primaxin 500mg  IV q5h  Follow up SCr in am and adjust if needed  Loralee Pacas, PharmD, BCPS Pager: [phone] 02/24/2013,11:29 AM

## 2013-02-25 DIAGNOSIS — D509 Iron deficiency anemia, unspecified: Secondary | ICD-10-CM

## 2013-02-25 LAB — BASIC METABOLIC PANEL
BUN: 7 mg/dL (ref 6–23)
CO2: 28 mEq/L (ref 19–32)
Chloride: 104 mEq/L (ref 96–112)
Creatinine, Ser: 0.4 mg/dL — ABNORMAL LOW (ref 0.50–1.35)
GFR calc Af Amer: >90 mL/min (ref >90)
Glucose, Bld: 98 mg/dL (ref 70–99)
Potassium: 2.6 mEq/L — CL (ref 3.5–5.1)
Sodium: 137 mEq/L (ref 135–145)

## 2013-02-25 LAB — CBC
HCT: 22.2 % — ABNORMAL LOW (ref 39.0–52.0)
Hemoglobin: 6.6 g/dL — CL (ref 13.0–17.0)
MCH: 22.3 pg — ABNORMAL LOW (ref 26.0–34.0)
MCV: 75 fL — ABNORMAL LOW (ref 78.0–100.0)
RBC: 2.96 MIL/uL — ABNORMAL LOW (ref 4.22–5.81)
WBC: 11.9 10*3/uL — ABNORMAL HIGH (ref 4.0–10.5)

## 2013-02-25 LAB — CULTURE, ROUTINE-ABSCESS: Special Requests: NORMAL

## 2013-02-25 MED ORDER — DEXTROSE 5 % IV SOLN
2.0000 g | Freq: Every day | INTRAVENOUS | Status: DC
  Administered 2013-02-25 – 2013-03-02 (×6): 2 g via INTRAVENOUS
  Filled 2013-02-25 (×6): qty 2

## 2013-02-25 MED ORDER — FAMOTIDINE 20 MG PO TABS
20.0000 mg | ORAL_TABLET | Freq: Two times a day (BID) | ORAL | Status: DC
  Administered 2013-02-25 – 2013-03-02 (×11): 20 mg via ORAL
  Filled 2013-02-25 (×12): qty 1

## 2013-02-25 MED ORDER — POTASSIUM CHLORIDE CRYS ER 20 MEQ PO TBCR
60.0000 meq | EXTENDED_RELEASE_TABLET | ORAL | Status: AC
  Administered 2013-02-25: 60 meq via ORAL
  Filled 2013-02-25 (×2): qty 3

## 2013-02-25 MED ORDER — HYDRALAZINE HCL 20 MG/ML IJ SOLN
10.0000 mg | Freq: Four times a day (QID) | INTRAMUSCULAR | Status: DC | PRN
  Filled 2013-02-25: qty 1

## 2013-02-25 MED ORDER — FE FUMARATE-B12-VIT C-FA-IFC PO CAPS
1.0000 | ORAL_CAPSULE | Freq: Three times a day (TID) | ORAL | Status: DC
  Administered 2013-02-25 – 2013-03-02 (×16): 1 via ORAL
  Filled 2013-02-25 (×19): qty 1

## 2013-02-25 MED ORDER — JUVEN PO PACK
1.0000 | PACK | Freq: Two times a day (BID) | ORAL | Status: DC
  Administered 2013-02-25 – 2013-03-02 (×11): 1 via ORAL
  Filled 2013-02-25 (×12): qty 1

## 2013-02-25 MED ORDER — HYOSCYAMINE SULFATE 0.125 MG PO TABS
0.1250 mg | ORAL_TABLET | ORAL | Status: DC | PRN
  Administered 2013-02-25: 0.125 mg via ORAL
  Filled 2013-02-25: qty 1

## 2013-02-25 MED ORDER — FOSFOMYCIN TROMETHAMINE 3 G PO PACK
3.0000 g | PACK | Freq: Once | ORAL | Status: AC
  Administered 2013-02-25: 3 g via ORAL
  Filled 2013-02-25: qty 3

## 2013-02-25 MED ORDER — HYDRALAZINE HCL 20 MG/ML IJ SOLN: 10.0000 mg | Freq: Once | INTRAMUSCULAR | Status: DC

## 2013-02-25 MED ORDER — ENSURE COMPLETE PO LIQD
237.0000 mL | Freq: Two times a day (BID) | ORAL | Status: DC
  Administered 2013-02-25 – 2013-03-02 (×11): 237 mL via ORAL

## 2013-02-25 MED ORDER — METRONIDAZOLE 500 MG PO TABS
500.0000 mg | ORAL_TABLET | Freq: Three times a day (TID) | ORAL | Status: DC
  Administered 2013-02-25 – 2013-03-02 (×15): 500 mg via ORAL
  Filled 2013-02-25 (×18): qty 1

## 2013-02-25 NOTE — Care Management Note (Unsigned)
    Page 1 of 2   02/28/2013     11:39:08 AM   CARE MANAGEMENT NOTE 02/28/2013  Patient:  [name] [name],[name] [name]   Account Number:  [id number]  Date Initiated:  02/25/2013  Documentation initiated by:  Colleen Can  Subjective/Objective Assessment:   DX ABSCESS TO LEFT THIGH, LARGE SACRAL DECUBITUS, CHRONIC LELVIC OSTEOMYELITIS., SUPRAPUBIC CATH; quadraplegia     Action/Plan:   CM spoke with patient. Pt is from SNF.  Current plans are for him to return to his home where he has personal caregiver via [facility]. Already has w/c. Plans to get hospital bed; he has made arrangemnents.   Anticipated DC Date:  02/25/2013   Anticipated DC Plan:  HOME W HOME HEALTH SERVICES  In-house referral  Clinical Social Worker      DC Planning Services  CM consult      PAC Choice  DURABLE MEDICAL EQUIPMENT  HOME HEALTH   Choice offered to / List presented to:  C-1 Patient        HH arranged  HH-1 RN  IV Antibiotics      Status of service:  In process, will continue to follow Medicare Important Message given?   (If response is "NO", the following Medicare IM given date fields will be blank) Date Medicare IM given:   Date Additional Medicare IM given:    Discharge Disposition:    Per UR Regulation:    If discussed at Long Length of Stay Meetings, dates discussed:   02/26/2013  02/28/2013    Comments:  02/28/2013 Giara Mcgaughey BSN RN CCM [phone] CM spoke with patient 10/15 & he advised that his hospital bed was to be delivered to his home 10/15. [facility] will deliver his requested hoyer lift and bedside table to his home. [facility] will provide [facility] & infusion services upon pt's discharge. Will need prescription for home iv abx and orders for wound care and any other needed skilled services. CM will follow.     02/25/2013 Colleen Can BSN RN CCM [phone] Pt will require home IV abx and wound care. States is will also need hoyer lift. [facility]  notified of services request. States they will be able to service patient.CM will folow.

## 2013-02-25 NOTE — Progress Notes (Addendum)
Patient ID: [name] [name], male   DOB: [birth year], 47 y.o.   MRN: [id number]   Pt has chronic SP tube that was changed today by 4th floor RN.  70f removed and replaced with a 62f.  Leakage noted around cath instead of draining into bag.  RN placed more fluid in balloon and leakage decreased significantly per 6th floor RN, however, still present.  I checked cath position and it is appropriate.  Some urine leakage with manipulation.  Will start Levsin (allergic to ditropan) for spasms.  If leakage persists will need to upsize cath to 22 or 74f.

## 2013-02-25 NOTE — Progress Notes (Signed)
Pt has 3 wounds. Left outer Ischial is 10cm in height x 5cm width and 2cm in depth.  Middle below scrotal wound is 8cm heightx5cmwidth and 2cm depth.  Right Ischial wound is 10cm heigth x13cm width and in upper 2 cm of this wound is a fissure tunnel of 3cm heightx2cm width x3cm depth. This tunnel wound has some white drainage slough.

## 2013-02-25 NOTE — Progress Notes (Signed)
[facility]   Day # 5 imipenem   Subjective: Receiving blood transfusion   Antibiotics:  Anti-infectives   Start     Dose/Rate Route Frequency Ordered Stop   02/25/13 1400  metroNIDAZOLE (FLAGYL) tablet 500 mg     500 mg Oral 3 times per day 02/25/13 1214     02/25/13 1230  cefTRIAXone (ROCEPHIN) 2 g in dextrose 5 % 50 mL IVPB     2 g 100 mL/hr over 30 Minutes Intravenous Daily 02/25/13 1214     02/21/13 2359  levofloxacin (LEVAQUIN) IVPB 750 mg  Status:  Discontinued     750 mg 100 mL/hr over 90 Minutes Intravenous Every 24 hours 02/21/13 0658 02/21/13 1302   02/21/13 2000  vancomycin (VANCOCIN) IVPB 1000 mg/200 mL premix  Status:  Discontinued     1,000 mg 200 mL/hr over 60 Minutes Intravenous Every 12 hours 02/21/13 0708 02/21/13 1535   02/21/13 1400  imipenem-cilastatin (PRIMAXIN) 500 mg in sodium chloride 0.9 % 100 mL IVPB  Status:  Discontinued     500 mg 200 mL/hr over 30 Minutes Intravenous Every 6 hours 02/21/13 1318 02/25/13 1214   02/21/13 0800  vancomycin (VANCOCIN) IVPB 1000 mg/200 mL premix     1,000 mg 200 mL/hr over 60 Minutes Intravenous  Once 02/21/13 0659 02/21/13 0941   02/21/13 0430  vancomycin (VANCOCIN) IVPB 1000 mg/200 mL premix     1,000 mg 200 mL/hr over 60 Minutes Intravenous  Once 02/21/13 0429 02/21/13 0624   02/21/13 0100  levofloxacin (LEVAQUIN) IVPB 750 mg     750 mg 100 mL/hr over 90 Minutes Intravenous  Once 02/21/13 0052 02/21/13 0417      Medications: Scheduled Meds: . baclofen  20 mg Oral Q6H  . cefTRIAXone (ROCEPHIN)  IV  2 g Intravenous Q1200  . docusate sodium  100 mg Oral BID  . enoxaparin (LOVENOX) injection  40 mg Subcutaneous Q24H  . feeding supplement (ENSURE COMPLETE)  237 mL Oral BID BM  . fosfomycin  3 g Oral Once  . metoCLOPramide  5 mg Oral TID AC  . metroNIDAZOLE  500 mg Oral Q8H  . multivitamin with minerals  1 tablet Oral Daily  . nutrition supplement (JUVEN)  1 packet Oral BID BM    . vitamin C  500 mg Oral Daily  . zinc sulfate  220 mg Oral Daily   Continuous Infusions: . sodium chloride 20 mL/hr at 02/24/13 1003   PRN Meds:.sodium chloride   Objective: Weight change:   Intake/Output Summary (Last 24 hours) at 02/25/13 1342 Last data filed at 02/25/13 1230  Gross per 24 hour  Intake 2592.75 ml  Output   5300 ml  Net -2707.25 ml   Blood pressure 184/103, pulse 78, temperature 98.8 F (37.1 C), temperature source Oral, resp. rate 18, SpO2 98.00%. Temp:  [98.4 F (36.9 C)-99.7 F (37.6 C)] 98.8 F (37.1 C) (10/13 1300) Pulse Rate:  [78-101] 78 (10/13 1300) Resp:  [18-20] 18 (10/13 1300) BP: (133-184)/(80-103) 184/103 mmHg (10/13 1300) SpO2:  [97 %-99 %] 98 % (10/13 1300)  Physical Exam: General: Alert and awake, oriented x3, not in any acute distress.  HEENT: anicteric sclera, pupils reactive to light and accommodation, EOMI, oropharynx clear and without exudate  CVS regular rate, normal r, no murmur rubs or gallops  Chest: clear to auscultation bilaterally, no wheezing, rales or rhonchi  Abdomen: soft nontender, nondistended, normal bowel sounds ostomy with stool  Extremities/skin: drain  in place with  material filling lining this   Lab Results:  Recent Labs  02/25/13 0605  WBC 11.9*  HGB 6.6*  HCT 22.2*  PLT 548*    BMET  Recent Labs  02/25/13 0605  NA 137  K 2.6*  CL 104  CO2 28  GLUCOSE 98  BUN 7  CREATININE 0.40*  CALCIUM 8.0*    Micro Results: Recent Results (from the past 240 hour(s))  URINE CULTURE     Status: None   Collection Time    02/20/13  7:34 PM      Result Value Range Status   Specimen Description URINE, CATHETERIZED   Final   Special Requests NONE   Final   Culture  Setup Time     Final   Value: 02/21/2013 02:28     Performed at [facility] Count     Final   Value: >=100,000 COLONIES/ML     Performed at [facility]   Culture     Final   Value: ENTEROBACTER CLOACAE      Performed at [facility]   Report Status 02/24/2013 FINAL   Final   Organism ID, Bacteria ENTEROBACTER CLOACAE   Final  MRSA PCR SCREENING     Status: None   Collection Time    02/21/13  1:41 PM      Result Value Range Status   MRSA by PCR NEGATIVE  NEGATIVE Final   Comment:            The GeneXpert MRSA Assay (FDA     approved for NASAL specimens     only), is one component of a     comprehensive MRSA colonization     surveillance program. It is not     intended to diagnose MRSA     infection nor to guide or     monitor treatment for     MRSA infections.  CULTURE, ROUTINE-ABSCESS     Status: None   Collection Time    02/22/13  1:21 PM      Result Value Range Status   Specimen Description ABSCESS LEFT THIGH   Final   Special Requests Normal   Final   Gram Stain     Final   Value: ABUNDANT WBC PRESENT,BOTH PMN AND MONONUCLEAR     NO SQUAMOUS EPITHELIAL CELLS SEEN     NO ORGANISMS SEEN     Performed at [facility]   Culture     Final   Value: MODERATE ESCHERICHIA COLI     Performed at [facility]   Report Status 02/25/2013 FINAL   Final   Organism ID, Bacteria ESCHERICHIA COLI   Final  ANAEROBIC CULTURE     Status: None   Collection Time    02/22/13  1:21 PM      Result Value Range Status   Specimen Description ABSCESS LEFT THIGH   Final   Special Requests Normal   Final   Gram Stain     Final   Value: ABUNDANT WBC PRESENT,BOTH PMN AND MONONUCLEAR     NO SQUAMOUS EPITHELIAL CELLS SEEN     NO ORGANISMS SEEN     Performed at [facility]   Culture     Final   Value: NO ANAEROBES ISOLATED; CULTURE IN PROGRESS FOR 5 DAYS     Performed at [facility]   Report Status PENDING   Incomplete    Studies/Results: No results found.  Assessment/Plan: [name] [name] is a 47 y.o. male with  with chronic sacral decubitus ulcer, right hip infection, now left thigh abscess and hip infection   #1 Left thigh abscess and hip joint  infection now sp Drain by IR: with E coli on culture  --narrow to Rocephin plus oral flagyl to cover anerobes --would rx for 6 weeks provided proper drainage   #2 ? UTI:  >100K GNR,  Likely was a colonizer. Lab did not workup for carbapenem S? But had mentioned ? [facility] and organims is listed as R to all Beta lactams tested (but carbapenems not listed) --will give one time dose of fosfomycin today and am dcing the imipenem  I will arrange HSFU in the next 4 weeks and cancel appt for this Thursday he has with Dr. Ninetta Lights   LOS: 5 days   Paulette Blanch Dam 02/25/2013, 1:42 PM

## 2013-02-25 NOTE — Progress Notes (Signed)
Earlier in shift, pt requested I come back around midnight to setup nocturnal cpap.  Pt now ready for cpap.  Placed pt on cpap 19cm h2o per home settings with full face mask as he wears this at home.  Pt is tolerating very well at this time.  RN notified.  Sterile water added to max fill line of humidity chamber.

## 2013-02-25 NOTE — Progress Notes (Signed)
3 Days Post-Op  Subjective: Pt with some leaking around suprapubic tube; no acute issues with left thigh drain  Objective: Vital signs in last 24 hours: Temp:  [98.4 F (36.9 C)-99.7 F (37.6 C)] 98.8 F (37.1 C) (10/13 1300) Pulse Rate:  [78-101] 78 (10/13 1300) Resp:  [18-20] 18 (10/13 1300) BP: (133-184)/(80-103) 181/101 mmHg (10/13 1343) SpO2:  [97 %-99 %] 98 % (10/13 1300) Last BM Date: 02/25/13 (Colostomy)  Intake/Output from previous day: 10/12 0701 - 10/13 0700 In: 2332.8 [P.O.:1200; I.V.:702.8; IV Piggyback:400] Out: 3980 [Urine:3900; Drains:80] Intake/Output this shift: Total I/O In: 740 [P.O.:480; I.V.:250; Other:10] Out: 1320 [Urine:1300; Drains:20]  Left thigh drain intact, insertion site ok, output 20 cc's today ; cx's - e coli   Lab Results:   Recent Labs  02/25/13 0605  WBC 11.9*  HGB 6.6*  HCT 22.2*  PLT 548*   BMET  Recent Labs  02/25/13 0605  NA 137  K 2.6*  CL 104  CO2 28  GLUCOSE 98  BUN 7  CREATININE 0.40*  CALCIUM 8.0*   PT/INR No results found for this basename: LABPROT, INR,  in the last 72 hours ABG No results found for this basename: PHART, PCO2, PO2, HCO3,  in the last 72 hours  Studies/Results: No results found.  Anti-infectives: Anti-infectives   Start     Dose/Rate Route Frequency Ordered Stop   02/25/13 1400  metroNIDAZOLE (FLAGYL) tablet 500 mg     500 mg Oral 3 times per day 02/25/13 1214     02/25/13 1230  cefTRIAXone (ROCEPHIN) 2 g in dextrose 5 % 50 mL IVPB     2 g 100 mL/hr over 30 Minutes Intravenous Daily 02/25/13 1214     02/21/13 2359  levofloxacin (LEVAQUIN) IVPB 750 mg  Status:  Discontinued     750 mg 100 mL/hr over 90 Minutes Intravenous Every 24 hours 02/21/13 0658 02/21/13 1302   02/21/13 2000  vancomycin (VANCOCIN) IVPB 1000 mg/200 mL premix  Status:  Discontinued     1,000 mg 200 mL/hr over 60 Minutes Intravenous Every 12 hours 02/21/13 0708 02/21/13 1535   02/21/13 1400  imipenem-cilastatin  (PRIMAXIN) 500 mg in sodium chloride 0.9 % 100 mL IVPB  Status:  Discontinued     500 mg 200 mL/hr over 30 Minutes Intravenous Every 6 hours 02/21/13 1318 02/25/13 1214   02/21/13 0800  vancomycin (VANCOCIN) IVPB 1000 mg/200 mL premix     1,000 mg 200 mL/hr over 60 Minutes Intravenous  Once 02/21/13 0659 02/21/13 0941   02/21/13 0430  vancomycin (VANCOCIN) IVPB 1000 mg/200 mL premix     1,000 mg 200 mL/hr over 60 Minutes Intravenous  Once 02/21/13 0429 02/21/13 0624   02/21/13 0100  levofloxacin (LEVAQUIN) IVPB 750 mg     750 mg 100 mL/hr over 90 Minutes Intravenous  Once 02/21/13 0052 02/21/13 0417      Assessment/Plan: s/p left ant thigh abscess drainage 10/10; cont current tx/drain irrigation; ID following; check CT later in week to assess adequacy of drainage  LOS: 5 days    Florance Paolillo,D [facility] 02/25/2013

## 2013-02-25 NOTE — Progress Notes (Addendum)
[facility] PROGRESS NOTE  [name] [name] ZOX:[id number] DOB: 04/03/1966 DOA: 02/20/2013 PCP: [name], MD  Assessment/Plan:  1. 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 Chronic indwelling foley -UA abnormal appearing difficult to differentiate UTI vs Colonization since pt has no sensations -currently on PRimaxin, U CX with Enterobacter  -RN to change suprapubic catheter today  4. Quadriplegia  -following C5 fracture (1991)  5. Severe protein calorie malnutrition -albumin 1.7, Nutrition consult  6. Reactive thrombocytosis  7. Anemia from ongoing oozing and blood loss from large decub -hb down to 6.6 today will tranfuse -CBC in am  8. Hypokalemia -replace  Code Status: Full Family Communication: none at bedside Disposition Plan: Pt declines SNf at this time, home with [facility] services in few days   Consultants:  CCS  Orthopedics  Antibiotics:  Vanc  LEvaquin  HPI/Subjective: denies any complaints Objective: Filed Vitals:   02/25/13 0505  BP: 135/82  Pulse: 101  Temp: 98.4 F (36.9 C)  Resp: 20    Intake/Output Summary (Last 24 hours) at 02/25/13 1017 Last data filed at  02/25/13 0800  Gross per 24 hour  Intake 2342.75 ml  Output   4800 ml  Net -2457.25 ml   There were no vitals filed for this visit.  Exam:   General:  AAOx3, no distress  Cardiovascular:S1S2/RRR  Respiratory: CTAB  Abdomen: soft, Nt, obese, BS present  Musculoskeletal: contractures involving arms and legs  Tightness and fullness in L thigh  Quadriplegic  Data Reviewed: Basic Metabolic Panel:  Recent Labs Lab 02/20/13 2358 02/22/13 0510 02/25/13 0605  NA 138 140 137  K 3.2* 3.2* 2.6*  CL 101 103 104  CO2 27 27 28   GLUCOSE 120* 110* 98  BUN 14 11 7   CREATININE 0.41* 0.50 0.40*  CALCIUM 8.8 8.5 8.0*   Liver Function Tests: No results found for this basename: AST, ALT, ALKPHOS, BILITOT, PROT, ALBUMIN,  in the last 168 hours No results found for this basename: LIPASE, AMYLASE,  in the last 168 hours No results found for this basename: AMMONIA,  in the last 168 hours CBC:  Recent Labs Lab 02/20/13 2358 02/22/13 0510 02/25/13 0605  WBC 19.4* 16.8* 11.9*  NEUTROABS 14.4*  --   --   HGB 7.6* 7.0* 6.6*  HCT 25.4* 23.4* 22.2*  MCV 74.5* 75.0* 75.0*  PLT 671* 627* 548*   Cardiac Enzymes: No results found for this basename: CKTOTAL, CKMB, CKMBINDEX, TROPONINI,  in the last 168 hours BNP (last 3 results) No results found for this basename: PROBNP,  in the last 8760 hours CBG: No results found for this basename: GLUCAP,  in the last 168 hours  Recent Results (from the past 240 hour(s))  URINE CULTURE     Status:  None   Collection Time    02/20/13  7:34 PM      Result Value Range Status   Specimen Description URINE, CATHETERIZED   Final   Special Requests NONE   Final   Culture  Setup Time     Final   Value: 02/21/2013 02:28     Performed at [facility] Count     Final   Value: >=100,000 COLONIES/ML     Performed at [facility]   Culture     Final   Value: ENTEROBACTER CLOACAE     Performed at [facility]   Report  Status 02/24/2013 FINAL   Final   Organism ID, Bacteria ENTEROBACTER CLOACAE   Final  MRSA PCR SCREENING     Status: None   Collection Time    02/21/13  1:41 PM      Result Value Range Status   MRSA by PCR NEGATIVE  NEGATIVE Final   Comment:            The GeneXpert MRSA Assay (FDA     approved for NASAL specimens     only), is one component of a     comprehensive MRSA colonization     surveillance program. It is not     intended to diagnose MRSA     infection nor to guide or     monitor treatment for     MRSA infections.  CULTURE, ROUTINE-ABSCESS     Status: None   Collection Time    02/22/13  1:21 PM      Result Value Range Status   Specimen Description ABSCESS LEFT THIGH   Final   Special Requests Normal   Final   Gram Stain     Final   Value: ABUNDANT WBC PRESENT,BOTH PMN AND MONONUCLEAR     NO SQUAMOUS EPITHELIAL CELLS SEEN     NO ORGANISMS SEEN     Performed at [facility]   Culture     Final   Value: MODERATE GRAM NEGATIVE RODS     Performed at [facility]   Report Status PENDING   Incomplete  ANAEROBIC CULTURE     Status: None   Collection Time    02/22/13  1:21 PM      Result Value Range Status   Specimen Description ABSCESS LEFT THIGH   Final   Special Requests Normal   Final   Gram Stain     Final   Value: ABUNDANT WBC PRESENT,BOTH PMN AND MONONUCLEAR     NO SQUAMOUS EPITHELIAL CELLS SEEN     NO ORGANISMS SEEN     Performed at [facility]   Culture     Final   Value: NO ANAEROBES ISOLATED; CULTURE IN PROGRESS FOR 5 DAYS     Performed at [facility]   Report Status PENDING   Incomplete     Studies: No results found.  Scheduled Meds: . baclofen  20 mg Oral Q6H  . docusate sodium  100 mg Oral BID  . enoxaparin (LOVENOX) injection  40 mg Subcutaneous Q24H  . imipenem-cilastatin  500 mg Intravenous Q6H  . metoCLOPramide  5 mg Oral TID AC  . multivitamin with minerals  1 tablet Oral Daily  . potassium chloride  60 mEq  Oral Q2H  . vitamin C  500 mg Oral Daily  . zinc sulfate  220 mg Oral Daily   Continuous Infusions: . sodium chloride 20 mL/hr  at 02/24/13 1003    Principal Problem:   Cellulitis and abscess of leg Active Problems:   Quadriplegia   Hyponatremia   Seizure disorder   Sacral decubitus ulcer, stage IV    Time spent:    [facility]  [facility] Pager [phone]. If 7PM-7AM, please contact night-coverage at [email or web address], password [email or web address] 02/25/2013, 10:17 AM  LOS: 5 days

## 2013-02-25 NOTE — Progress Notes (Signed)
[facility] Patient Status:   Active pt with [facility] for DME- previous [facility] for IV [facility]X.  [facility] is providing the following services:   HHRN and [facility] for home IV ABX.  [facility] hospital coordinators will follow pt this admission and support transition home when deemed necessary. Spoke with Bethann Berkshire, patient's caregiver, tonight.  She has supported IV ABX in the past on several occasions and is prepared to support again upon DC home this round.   If patient discharges after hours, please call [phone].   Sedalia Muta 02/25/2013, 5:37 PM

## 2013-02-26 DIAGNOSIS — A498 Other bacterial infections of unspecified site: Secondary | ICD-10-CM

## 2013-02-26 LAB — TYPE AND SCREEN

## 2013-02-26 LAB — BASIC METABOLIC PANEL
Chloride: 104 mEq/L (ref 96–112)
GFR calc Af Amer: >90 mL/min (ref >90)
GFR calc non Af Amer: >90 mL/min (ref >90)
Glucose, Bld: 106 mg/dL — ABNORMAL HIGH (ref 70–99)
Potassium: 3 mEq/L — ABNORMAL LOW (ref 3.5–5.1)
Sodium: 140 mEq/L (ref 135–145)

## 2013-02-26 LAB — CBC
Hemoglobin: 8.1 g/dL — ABNORMAL LOW (ref 13.0–17.0)
RBC: 3.49 MIL/uL — ABNORMAL LOW (ref 4.22–5.81)

## 2013-02-26 MED ORDER — POTASSIUM CHLORIDE CRYS ER 20 MEQ PO TBCR
40.0000 meq | EXTENDED_RELEASE_TABLET | Freq: Every day | ORAL | Status: DC
  Administered 2013-02-26 – 2013-03-02 (×5): 40 meq via ORAL
  Filled 2013-02-26 (×6): qty 2

## 2013-02-26 NOTE — Progress Notes (Signed)
[facility]    Day # 2 Ceftriaxone   Subjective: asleep   Antibiotics:  Anti-infectives   Start     Dose/Rate Route Frequency Ordered Stop   02/25/13 1400  metroNIDAZOLE (FLAGYL) tablet 500 mg     500 mg Oral 3 times per day 02/25/13 1214     02/25/13 1230  cefTRIAXone (ROCEPHIN) 2 g in dextrose 5 % 50 mL IVPB     2 g 100 mL/hr over 30 Minutes Intravenous Daily 02/25/13 1214     02/21/13 2359  levofloxacin (LEVAQUIN) IVPB 750 mg  Status:  Discontinued     750 mg 100 mL/hr over 90 Minutes Intravenous Every 24 hours 02/21/13 0658 02/21/13 1302   02/21/13 2000  vancomycin (VANCOCIN) IVPB 1000 mg/200 mL premix  Status:  Discontinued     1,000 mg 200 mL/hr over 60 Minutes Intravenous Every 12 hours 02/21/13 0708 02/21/13 1535   02/21/13 1400  imipenem-cilastatin (PRIMAXIN) 500 mg in sodium chloride 0.9 % 100 mL IVPB  Status:  Discontinued     500 mg 200 mL/hr over 30 Minutes Intravenous Every 6 hours 02/21/13 1318 02/25/13 1214   02/21/13 0800  vancomycin (VANCOCIN) IVPB 1000 mg/200 mL premix     1,000 mg 200 mL/hr over 60 Minutes Intravenous  Once 02/21/13 0659 02/21/13 0941   02/21/13 0430  vancomycin (VANCOCIN) IVPB 1000 mg/200 mL premix     1,000 mg 200 mL/hr over 60 Minutes Intravenous  Once 02/21/13 0429 02/21/13 0624   02/21/13 0100  levofloxacin (LEVAQUIN) IVPB 750 mg     750 mg 100 mL/hr over 90 Minutes Intravenous  Once 02/21/13 0052 02/21/13 0417      Medications: Scheduled Meds: . baclofen  20 mg Oral Q6H  . cefTRIAXone (ROCEPHIN)  IV  2 g Intravenous Q1200  . docusate sodium  100 mg Oral BID  . enoxaparin (LOVENOX) injection  40 mg Subcutaneous Q24H  . famotidine  20 mg Oral BID  . feeding supplement (ENSURE COMPLETE)  237 mL Oral BID BM  . ferrous fumarate-b12-vitamic C-folic acid  1 capsule Oral TID PC  . metoCLOPramide  5 mg Oral TID AC  . metroNIDAZOLE  500 mg Oral Q8H  . multivitamin with minerals  1 tablet Oral Daily    . nutrition supplement (JUVEN)  1 packet Oral BID BM  . potassium chloride  40 mEq Oral Daily  . vitamin C  500 mg Oral Daily  . zinc sulfate  220 mg Oral Daily   Continuous Infusions: . sodium chloride 20 mL/hr at 02/25/13 1517   PRN Meds:.hydrALAZINE, hyoscyamine, sodium chloride   Objective: Weight change:   Intake/Output Summary (Last 24 hours) at 02/26/13 1814 Last data filed at 02/26/13 1300  Gross per 24 hour  Intake    865 ml  Output    425 ml  Net    440 ml   Blood pressure 122/77, pulse 95, temperature 98 F (36.7 C), temperature source Oral, resp. rate 16, height 6' (1.829 m), weight 231 lb (104.781 kg), SpO2 100.00%. Temp:  [98 F (36.7 C)-98.9 F (37.2 C)] 98 F (36.7 C) (10/14 1303) Pulse Rate:  [93-104] 95 (10/14 1303) Resp:  [14-16] 16 (10/14 1303) BP: (112-138)/(77-81) 122/77 mmHg (10/14 1303) SpO2:  [98 %-100 %] 100 % (10/14 1303) Weight:  [231 lb (104.781 kg)] 231 lb (104.781 kg) (10/13 2100)  Physical Exam: General: sleeping comfortably I did not awaken him   Lab Results:  Recent Labs  02/25/13 0605 02/26/13 0415  WBC 11.9* 14.4*  HGB 6.6* 8.1*  HCT 22.2* 26.6*  PLT 548* 612*    BMET  Recent Labs  02/25/13 0605 02/26/13 0415  NA 137 140  K 2.6* 3.0*  CL 104 104  CO2 28 29  GLUCOSE 98 106*  BUN 7 11  CREATININE 0.40* 0.39*  CALCIUM 8.0* 8.7    Micro Results: Recent Results (from the past 240 hour(s))  URINE CULTURE     Status: None   Collection Time    02/20/13  7:34 PM      Result Value Range Status   Specimen Description URINE, CATHETERIZED   Final   Special Requests NONE   Final   Culture  Setup Time     Final   Value: 02/21/2013 02:28     Performed at [facility] Count     Final   Value: >=100,000 COLONIES/ML     Performed at [facility]   Culture     Final   Value: ENTEROBACTER CLOACAE     Performed at [facility]   Report Status 02/24/2013 FINAL   Final   Organism ID,  Bacteria ENTEROBACTER CLOACAE   Final  MRSA PCR SCREENING     Status: None   Collection Time    02/21/13  1:41 PM      Result Value Range Status   MRSA by PCR NEGATIVE  NEGATIVE Final   Comment:            The GeneXpert MRSA Assay (FDA     approved for NASAL specimens     only), is one component of a     comprehensive MRSA colonization     surveillance program. It is not     intended to diagnose MRSA     infection nor to guide or     monitor treatment for     MRSA infections.  CULTURE, ROUTINE-ABSCESS     Status: None   Collection Time    02/22/13  1:21 PM      Result Value Range Status   Specimen Description ABSCESS LEFT THIGH   Final   Special Requests Normal   Final   Gram Stain     Final   Value: ABUNDANT WBC PRESENT,BOTH PMN AND MONONUCLEAR     NO SQUAMOUS EPITHELIAL CELLS SEEN     NO ORGANISMS SEEN     Performed at [facility]   Culture     Final   Value: MODERATE ESCHERICHIA COLI     Performed at [facility]   Report Status 02/25/2013 FINAL   Final   Organism ID, Bacteria ESCHERICHIA COLI   Final  ANAEROBIC CULTURE     Status: None   Collection Time    02/22/13  1:21 PM      Result Value Range Status   Specimen Description ABSCESS LEFT THIGH   Final   Special Requests Normal   Final   Gram Stain     Final   Value: ABUNDANT WBC PRESENT,BOTH PMN AND MONONUCLEAR     NO SQUAMOUS EPITHELIAL CELLS SEEN     NO ORGANISMS SEEN     Performed at [facility]   Culture     Final   Value: NO ANAEROBES ISOLATED; CULTURE IN PROGRESS FOR 5 DAYS     Performed at [facility]   Report Status PENDING   Incomplete    Studies/Results:  No results found.    Assessment/Plan: [name] [name] is a 47 y.o. male with  with chronic sacral decubitus ulcer, right hip infection, now left thigh abscess and hip infection   #1 Left thigh abscess and hip joint infection now sp Drain by IR: with E coli on culture  --narrowed to Rocephin plus oral flagyl to  cover anerobes --would rx for 6 weeks provided proper drainage   #2 ? UTI:  >100K Enterobacter  Likely was a colonizer. Lab did not workup for carbapenem S? But had mentioned ? [facility] and organims is listed as R to all Beta lactams tested (but carbapenems not listed) Gave one dose of fosfomycin yesterday, done with treating this  I will arrange HSFU in the next 4 weeks    LOS: 6 days   Acey Lav 02/26/2013, 6:14 PM

## 2013-02-26 NOTE — Progress Notes (Signed)
4 Days Post-Op  Subjective:Marland Kitchen Pt ok, no new c/o today.  Objective: Vital signs in last 24 hours: Temp:  [98 F (36.7 C)-98.9 F (37.2 C)] 98 F (36.7 C) (10/14 0718) Pulse Rate:  [65-104] 93 (10/14 0718) Resp:  [14-18] 14 (10/14 0718) BP: (112-184)/(78-117) 112/78 mmHg (10/14 0718) SpO2:  [98 %-100 %] 98 % (10/14 0718) Weight:  [231 lb (104.781 kg)] 231 lb (104.781 kg) (10/13 2100) Last BM Date: 02/25/13  Intake/Output from previous day: 10/13 0701 - 10/14 0700 In: 1824 [P.O.:960; I.V.:541; Blood:273] Out: 1671 [Urine:1501; Drains:20; Stool:150] Intake/Output this shift: Total I/O In: 245 [P.O.:240; Other:5] Out: 51 [Drains:65; Stool:10]  Left thigh drain intact, insertion site ok, increased output to past 24hr.  Lab Results:   Recent Labs  02/25/13 0605 02/26/13 0415  WBC 11.9* 14.4*  HGB 6.6* 8.1*  HCT 22.2* 26.6*  PLT 548* 612*   BMET  Recent Labs  02/25/13 0605 02/26/13 0415  NA 137 140  K 2.6* 3.0*  CL 104 104  CO2 28 29  GLUCOSE 98 106*  BUN 7 11  CREATININE 0.40* 0.39*  CALCIUM 8.0* 8.7    Anti-infectives: Anti-infectives   Start     Dose/Rate Route Frequency Ordered Stop   02/25/13 1400  metroNIDAZOLE (FLAGYL) tablet 500 mg     500 mg Oral 3 times per day 02/25/13 1214     02/25/13 1230  cefTRIAXone (ROCEPHIN) 2 g in dextrose 5 % 50 mL IVPB     2 g 100 mL/hr over 30 Minutes Intravenous Daily 02/25/13 1214     02/21/13 2359  levofloxacin (LEVAQUIN) IVPB 750 mg  Status:  Discontinued     750 mg 100 mL/hr over 90 Minutes Intravenous Every 24 hours 02/21/13 0658 02/21/13 1302   02/21/13 2000  vancomycin (VANCOCIN) IVPB 1000 mg/200 mL premix  Status:  Discontinued     1,000 mg 200 mL/hr over 60 Minutes Intravenous Every 12 hours 02/21/13 0708 02/21/13 1535   02/21/13 1400  imipenem-cilastatin (PRIMAXIN) 500 mg in sodium chloride 0.9 % 100 mL IVPB  Status:  Discontinued     500 mg 200 mL/hr over 30 Minutes Intravenous Every 6 hours  02/21/13 1318 02/25/13 1214   02/21/13 0800  vancomycin (VANCOCIN) IVPB 1000 mg/200 mL premix     1,000 mg 200 mL/hr over 60 Minutes Intravenous  Once 02/21/13 0659 02/21/13 0941   02/21/13 0430  vancomycin (VANCOCIN) IVPB 1000 mg/200 mL premix     1,000 mg 200 mL/hr over 60 Minutes Intravenous  Once 02/21/13 0429 02/21/13 0624   02/21/13 0100  levofloxacin (LEVAQUIN) IVPB 750 mg     750 mg 100 mL/hr over 90 Minutes Intravenous  Once 02/21/13 0052 02/21/13 0417      Assessment/Plan: s/p left ant thigh abscess drainage 10/10;  Drain output and WBC slightly up past 24hrs. Cont to follow. Rec repeat CT scan later this week to re-eval.  LOS: 6 days    Brayton El 02/26/2013
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
## 2013-02-27 ENCOUNTER — Inpatient Hospital Stay (HOSPITAL_COMMUNITY): Payer: Medicare Other

## 2013-02-27 LAB — CBC
HCT: 27.4 % — ABNORMAL LOW (ref 39.0–52.0)
Hemoglobin: 8.1 g/dL — ABNORMAL LOW (ref 13.0–17.0)
MCH: 22.6 pg — ABNORMAL LOW (ref 26.0–34.0)
MCHC: 29.6 g/dL — ABNORMAL LOW (ref 30.0–36.0)
MCV: 76.3 fL — ABNORMAL LOW (ref 78.0–100.0)
Platelets: 580 10*3/uL — ABNORMAL HIGH (ref 150–400)
RBC: 3.59 MIL/uL — ABNORMAL LOW (ref 4.22–5.81)
RDW: 20.6 % — ABNORMAL HIGH (ref 11.5–15.5)
WBC: 16.6 10*3/uL — ABNORMAL HIGH (ref 4.0–10.5)

## 2013-02-27 LAB — ANAEROBIC CULTURE

## 2013-02-27 LAB — BASIC METABOLIC PANEL
Calcium: 9.2 mg/dL (ref 8.4–10.5)
GFR calc Af Amer: >90 mL/min (ref >90)
GFR calc non Af Amer: >90 mL/min (ref >90)
Potassium: 3.6 mEq/L (ref 3.5–5.1)
Sodium: 139 mEq/L (ref 135–145)

## 2013-02-27 MED ORDER — IOHEXOL 300 MG/ML  SOLN
100.0000 mL | Freq: Once | INTRAMUSCULAR | Status: AC | PRN
  Administered 2013-02-27: 100 mL via INTRAVENOUS

## 2013-02-27 NOTE — Progress Notes (Signed)
02/26/13 2355 Nursing Left knee dsg changed-excoriated area noted/no drainage. jp drain dsg changed.area wnl. Left and right hip dsgs changed per order as well as sacrum/scrotum dsg. Moderate serous sanguinous drainage noted to all wounds. Tunneling noted to scrotum wound. jp drain flushed and emptied. Colostomy bag changed. Note: suprapubic catheter still leaking urine post catheter change on dayshift. Patient tolerated procedure well.
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
## 2013-02-27 NOTE — Progress Notes (Signed)
5 Days Post-Op  Subjective: Pt denies new c/o; still having problems with leaking around suprapubic cath  Objective: Vital signs in last 24 hours: Temp:  [98.9 F (37.2 C)-99.1 F (37.3 C)] 98.9 F (37.2 C) (10/15 1340) Pulse Rate:  [93-97] 93 (10/15 1340) Resp:  [16] 16 (10/15 1340) BP: (116-131)/(80-86) 123/84 mmHg (10/15 1340) SpO2:  [98 %] 98 % (10/15 1340) Last BM Date: 02/27/13 (in ostomy pouch )  Intake/Output from previous day: 10/14 0701 - 10/15 0700 In: 1705 [P.O.:1440; I.V.:240] Out: 155 [Drains:145; Stool:10] Intake/Output this shift: Total I/O In: 480 [P.O.:480] Out: -   Left thigh drain intact, insertion site ok, output has increased to 145 cc's today  Lab Results:   Recent Labs  02/26/13 0415 02/27/13 0555  WBC 14.4* 16.6*  HGB 8.1* 8.1*  HCT 26.6* 27.4*  PLT 612* 580*   BMET  Recent Labs  02/26/13 0415 02/27/13 0555  NA 140 139  K 3.0* 3.6  CL 104 101  CO2 29 29  GLUCOSE 106* 108*  BUN 11 15  CREATININE 0.39* 0.42*  CALCIUM 8.7 9.2   PT/INR No results found for this basename: LABPROT, INR,  in the last 72 hours ABG No results found for this basename: PHART, PCO2, PO2, HCO3,  in the last 72 hours  Studies/Results: No results found.  Anti-infectives: Anti-infectives   Start     Dose/Rate Route Frequency Ordered Stop   02/25/13 1400  metroNIDAZOLE (FLAGYL) tablet 500 mg     500 mg Oral 3 times per day 02/25/13 1214     02/25/13 1230  cefTRIAXone (ROCEPHIN) 2 g in dextrose 5 % 50 mL IVPB     2 g 100 mL/hr over 30 Minutes Intravenous Daily 02/25/13 1214     02/21/13 2359  levofloxacin (LEVAQUIN) IVPB 750 mg  Status:  Discontinued     750 mg 100 mL/hr over 90 Minutes Intravenous Every 24 hours 02/21/13 0658 02/21/13 1302   02/21/13 2000  vancomycin (VANCOCIN) IVPB 1000 mg/200 mL premix  Status:  Discontinued     1,000 mg 200 mL/hr over 60 Minutes Intravenous Every 12 hours 02/21/13 0708 02/21/13 1535   02/21/13 1400   imipenem-cilastatin (PRIMAXIN) 500 mg in sodium chloride 0.9 % 100 mL IVPB  Status:  Discontinued     500 mg 200 mL/hr over 30 Minutes Intravenous Every 6 hours 02/21/13 1318 02/25/13 1214   02/21/13 0800  vancomycin (VANCOCIN) IVPB 1000 mg/200 mL premix     1,000 mg 200 mL/hr over 60 Minutes Intravenous  Once 02/21/13 0659 02/21/13 0941   02/21/13 0430  vancomycin (VANCOCIN) IVPB 1000 mg/200 mL premix     1,000 mg 200 mL/hr over 60 Minutes Intravenous  Once 02/21/13 0429 02/21/13 0624   02/21/13 0100  levofloxacin (LEVAQUIN) IVPB 750 mg     750 mg 100 mL/hr over 90 Minutes Intravenous  Once 02/21/13 0052 02/21/13 0417      Assessment/Plan: s/p left ant thigh abscess drainage 10/10; with increasing WBC will schedule f/u CT to reassess adequacy of drainage and r/o additional collections  LOS: 7 days    ALLRED,D [facility] 02/27/2013

## 2013-02-27 NOTE — Progress Notes (Signed)
Placed patient on BIPAP. Patient tolerating well. RT will continue to monitor.

## 2013-02-27 NOTE — Progress Notes (Signed)
[facility]  [facility] is providing the following services: Hoyer Lift and Bedside Table  If patient discharges after hours, please call [phone].   Renard Hamper 02/27/2013, 10:34 AM

## 2013-02-28 ENCOUNTER — Ambulatory Visit: Payer: Medicare Other | Admitting: Infectious Diseases

## 2013-02-28 DIAGNOSIS — D72829 Elevated white blood cell count, unspecified: Secondary | ICD-10-CM

## 2013-02-28 LAB — CBC
Hemoglobin: 8 g/dL — ABNORMAL LOW (ref 13.0–17.0)
MCH: 22.9 pg — ABNORMAL LOW (ref 26.0–34.0)
Platelets: 575 10*3/uL — ABNORMAL HIGH (ref 150–400)
RBC: 3.49 MIL/uL — ABNORMAL LOW (ref 4.22–5.81)
RDW: 21.5 % — ABNORMAL HIGH (ref 11.5–15.5)

## 2013-02-28 LAB — BASIC METABOLIC PANEL
BUN: 16 mg/dL (ref 6–23)
CO2: 29 mEq/L (ref 19–32)
Calcium: 8.8 mg/dL (ref 8.4–10.5)
Creatinine, Ser: 0.54 mg/dL (ref 0.50–1.35)
GFR calc non Af Amer: >90 mL/min (ref >90)
Glucose, Bld: 123 mg/dL — ABNORMAL HIGH (ref 70–99)
Potassium: 3.8 mEq/L (ref 3.5–5.1)
Sodium: 138 mEq/L (ref 135–145)

## 2013-02-28 NOTE — Progress Notes (Signed)
[facility] PROGRESS NOTE  [name] [name] ZOX:[id number] DOB: 02/12/1966 DOA: 02/20/2013 PCP: [name], MD Brief NArrative 46 yo male quadraplegic, htn, chronic osteo pelvis/Septic Hip, Large stage 4 sacral wounds, Colostomy, Suprapubic catheter has been on/off many abx since sept last year with many infections has been off abx about 2 weeks comes in with fever. Noted to have L thigh abscess in addition to chronic findings, Seen by Ortho and CCS. Underwent drain per IR, ID following on IV Abx Improving, see details below.  Assessment/Plan:  1. L anterior thigh abscess with large decubitus wounds and some air tracking to R and L Hip -Off IV Vanc, was on Primaxin for 4 days -CCS signed off -ORtho Consult per Dr.Xu appreciated -s/p IR drain of  thigh abscess 10/10, IR following, IR to re-image Thigh later this week to determine adequacy of drainage -Wound Cx with moderate Ecoli pansensitive,  - stopped Primaxin 10/13 and now on Ceftriaxone and Flagyl from 10/13 for 6 weeks total -ID/Dr. Algis Liming to arrange HSFU in the next 4 weeks -Repeat CT scan of the pelvis-No residual left thigh abscess, appreciate IR assistance to follow and recommend when drainage is to be DC'd-possible DC in a.m. if okay with IR    2. 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 Chronic indwelling foley -UA abnormal appearing difficult to differentiate UTI vs Colonization since pt has no sensations -U CX with Enterobacter, likely colonizer per ID given 1 dose fosfomycin  10/13 and stopped Imipenem 10/13  -RN changed suprapubic catheter 10/13 but new catheter with leakage and not draining well, I d/w Urology Dr.Wrenn 10/13, Marchelle Folks with Urology following and may upsize catheter today  4. Quadriplegia  -following C5 fracture (1991)  5. Severe protein calorie malnutrition -albumin 1.7, Nutrition consult  6. Reactive thrombocytosis  7. Anemia from ongoing oozing and blood loss from large decub -hb down to 6.6 on 10/13, transfused 1 unit PRBC -Hemoglobin stable at 8.0  8. Hypokalemia -resolved  Code Status: Full Family Communication: none at bedside Disposition Plan: Pt declines SNf at this time, home with [facility] services in few days, after repeat Imaging   Consultants:  CCS  Orthopedics  Antibiotics:  Vanc  LEvaquin  HPI/Subjective: pt denies any complaints Objective: Filed Vitals:   02/28/13 0919  BP: 95/62  Pulse: 104  Temp:   Resp:     Intake/Output Summary (Last 24 hours) at 02/28/13 1332 Last data filed at 02/28/13 0815  Gross per 24 hour  Intake 2302.33 ml  Output   1940 ml  Net 362.33 ml   Filed Weights   02/25/13 2100  Weight: 104.781 kg (231 lb)    Exam:   General:  AAOx3, no distress  Cardiovascular:S1S2/RRR  Respiratory: CTAB  Abdomen: soft, Nt, obese, BS present  Musculoskeletal: contractures involving arms and legs, drain from thigh with about 30 cc of sero-purulent fluid  Tightness and fullness in L thigh  Neuro:Quadriplegic  Data Reviewed: Basic Metabolic Panel:  Recent Labs Lab 02/22/13 0510 02/25/13 0605 02/26/13 0415 02/27/13 0555 02/28/13 0500  NA 140 137 140 139 138  K 3.2* 2.6* 3.0*  3.6 3.8  CL 103 104 104 101 102  CO2 27 28 29 29 29   GLUCOSE 110* 98 106* 108* 123*  BUN 11 7 11 15 16   CREATININE 0.50 0.40* 0.39* 0.42* 0.54  CALCIUM 8.5 8.0* 8.7 9.2 8.8   Liver Function Tests: No results found for this basename: AST, ALT, ALKPHOS, BILITOT, PROT, ALBUMIN,  in the last 168  hours No results found for this basename: LIPASE, AMYLASE,  in the last 168 hours No results found for this basename: AMMONIA,  in the last 168 hours CBC:  Recent Labs Lab 02/22/13 0510 02/25/13 0605 02/26/13 0415 02/27/13 0555 02/28/13 0500  WBC 16.8* 11.9* 14.4* 16.6* 15.3*  HGB 7.0* 6.6* 8.1* 8.1* 8.0*  HCT 23.4* 22.2* 26.6* 27.4* 26.9*  MCV 75.0* 75.0* 76.2* 76.3* 77.1*  PLT 627* 548* 612* 580* 575*   Cardiac Enzymes: No results found for this basename: CKTOTAL, CKMB, CKMBINDEX, TROPONINI,  in the last 168 hours BNP (last 3 results) No results found for this basename: PROBNP,  in the last 8760 hours CBG: No results found for this basename: GLUCAP,  in the last 168 hours  Recent Results (from the past 240 hour(s))  URINE CULTURE     Status: None   Collection Time    02/20/13  7:34 PM      Result Value Range Status   Specimen Description URINE, CATHETERIZED   Final   Special Requests NONE   Final   Culture  Setup Time     Final   Value: 02/21/2013 02:28     Performed at [facility] Count     Final   Value: >=100,000 COLONIES/ML     Performed at [facility]   Culture     Final   Value: ENTEROBACTER CLOACAE     Performed at [facility]   Report Status 02/24/2013 FINAL   Final   Organism ID, Bacteria ENTEROBACTER CLOACAE   Final  MRSA PCR SCREENING     Status: None   Collection Time    02/21/13  1:41 PM      Result Value Range Status   MRSA by PCR NEGATIVE  NEGATIVE Final   Comment:            The GeneXpert MRSA Assay (FDA     approved for NASAL specimens     only), is one component of a     comprehensive MRSA colonization     surveillance program. It is not     intended to diagnose MRSA     infection nor to guide or     monitor treatment for     MRSA infections.  CULTURE, ROUTINE-ABSCESS     Status: None   Collection Time    02/22/13  1:21 PM      Result Value Range Status   Specimen Description ABSCESS LEFT THIGH    Final   Special Requests Normal   Final   Gram Stain     Final   Value: ABUNDANT WBC PRESENT,BOTH PMN AND MONONUCLEAR     NO SQUAMOUS EPITHELIAL CELLS SEEN     NO ORGANISMS SEEN     Performed at [facility]   Culture     Final   Value: MODERATE ESCHERICHIA COLI     Performed at [facility]   Report Status 02/25/2013 FINAL   Final   Organism ID, Bacteria ESCHERICHIA COLI   Final  ANAEROBIC CULTURE  Status: None   Collection Time    02/22/13  1:21 PM      Result Value Range Status   Specimen Description ABSCESS LEFT THIGH   Final   Special Requests Normal   Final   Gram Stain     Final   Value: ABUNDANT WBC PRESENT,BOTH PMN AND MONONUCLEAR     NO SQUAMOUS EPITHELIAL CELLS SEEN     NO ORGANISMS SEEN     Performed at [facility]   Culture     Final   Value: NO ANAEROBES ISOLATED     Performed at [facility]   Report Status 02/27/2013 FINAL   Final     Studies: Ct Pelvis W Contrast  02/28/2013   CLINICAL DATA:  Status post left thigh abscess drainage. Increasing white count.  EXAM: CT PELVIS WITH CONTRAST  TECHNIQUE: Multidetector CT imaging of the pelvis was performed using the standard protocol following the bolus administration of intravenous contrast.  CONTRAST:  OMNIPAQUE IOHEXOL 300 MG/ML  SOLN  COMPARISON:  02/21/2013  FINDINGS: Interval placement of a percutaneous drain into the left thigh abscess, located in the region of the upper rectus femoris and vastus. No residual fluid collection identified. Unchanged subcutaneous reticulation and skin thickening involving the left thigh.  The remainder of the scan is stable. There is chronic fragmentation and osteolysis around the bilateral hip joints, which communicate with large posterior decubitus ulcers. The volume of gas and soft tissue stranding in around the hip joints is stable  No new intra-abdominal findings to explain the increasing white count. There is a suprapubic catheter and  descending diverting colostomy.  IMPRESSION: 1. No residual left thigh abscess. A percutaneous drain remains central to the decompressed collection. 2. Similar extent of left thigh cellulitis. 3. 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   Electronically Signed   By: Tiburcio Pea M.D.   On: 02/28/2013 03:58    Scheduled Meds: . baclofen  20 mg Oral Q6H  . cefTRIAXone (ROCEPHIN)  IV  2 g Intravenous Q1200  . docusate sodium  100 mg Oral BID  . enoxaparin (LOVENOX) injection  40 mg Subcutaneous Q24H  . famotidine  20 mg Oral BID  . feeding supplement (ENSURE COMPLETE)  237 mL Oral BID BM  . ferrous fumarate-b12-vitamic C-folic acid  1 capsule Oral TID PC  . metoCLOPramide  5 mg Oral TID AC  . metroNIDAZOLE  500 mg Oral Q8H  . multivitamin with minerals  1 tablet Oral Daily  . nutrition supplement (JUVEN)  1 packet Oral BID BM  . potassium chloride  40 mEq Oral Daily  . vitamin C  500 mg Oral Daily  . zinc sulfate  220 mg Oral Daily   Continuous Infusions: . sodium chloride 20 mL/hr at 02/27/13 2259    Principal Problem:   Cellulitis and abscess of leg Active Problems:   Quadriplegia   Hyponatremia   Seizure disorder   Sacral decubitus ulcer, stage IV    Time spent:    Kela Millin  [facility] Pager [phone]. If 7PM-7AM, please contact night-coverage at [email or web address], password [facility] 02/28/2013, 1:32 PM  LOS: 8 days

## 2013-02-28 NOTE — Progress Notes (Signed)
6 Days Post-Op  Subjective: Pt without new c/o  Objective: Vital signs in last 24 hours: Temp:  [98.6 F (37 C)-98.9 F (37.2 C)] 98.9 F (37.2 C) (10/16 1430) Pulse Rate:  [98-104] 98 (10/16 1430) Resp:  [16] 16 (10/16 1430) BP: (95-125)/(62-85) 124/83 mmHg (10/16 1430) SpO2:  [97 %-99 %] 97 % (10/16 1430) Last BM Date: 02/27/13  Intake/Output from previous day: 10/15 0701 - 10/16 0700 In: 2542.3 [P.O.:1680; I.V.:312.3; IV Piggyback:50] Out: 1940 [Urine:1900; Drains:40] Intake/Output this shift: Total I/O In: 840 [P.O.:840] Out: 2150 [Urine:2150]  Left thigh drain intact, output about 40 cc's today serous appearing fluid ( primarily saline flush residual most likely)  Lab Results:   Recent Labs  02/27/13 0555 02/28/13 0500  WBC 16.6* 15.3*  HGB 8.1* 8.0*  HCT 27.4* 26.9*  PLT 580* 575*   BMET  Recent Labs  02/27/13 0555 02/28/13 0500  NA 139 138  K 3.6 3.8  CL 101 102  CO2 29 29  GLUCOSE 108* 123*  BUN 15 16  CREATININE 0.42* 0.54  CALCIUM 9.2 8.8   PT/INR No results found for this basename: LABPROT, INR,  in the last 72 hours ABG No results found for this basename: PHART, PCO2, PO2, HCO3,  in the last 72 hours  Studies/Results: Ct Pelvis W Contrast  02/28/2013   CLINICAL DATA:  Status post left thigh abscess drainage. Increasing white count.  EXAM: CT PELVIS WITH CONTRAST  TECHNIQUE: Multidetector CT imaging of the pelvis was performed using the standard protocol following the bolus administration of intravenous contrast.  CONTRAST:  OMNIPAQUE IOHEXOL 300 MG/ML  SOLN  COMPARISON:  02/21/2013  FINDINGS: Interval placement of a percutaneous drain into the left thigh abscess, located in the region of the upper rectus femoris and vastus. No residual fluid collection identified. Unchanged subcutaneous reticulation and skin thickening involving the left thigh.  The remainder of the scan is stable. There is chronic fragmentation and osteolysis around the  bilateral hip joints, which communicate with large posterior decubitus ulcers. The volume of gas and soft tissue stranding in around the hip joints is stable  No new intra-abdominal findings to explain the increasing white count. There is a suprapubic catheter and descending diverting colostomy.  IMPRESSION: 1. No residual left thigh abscess. A percutaneous drain remains central to the decompressed collection. 2. Similar extent of left thigh cellulitis. 3. 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   Electronically Signed   By: Tiburcio Pea M.D.   On: 02/28/2013 03:58    Anti-infectives: Anti-infectives   Start     Dose/Rate Route Frequency Ordered Stop   02/25/13 1400  metroNIDAZOLE (FLAGYL) tablet 500 mg     500 mg Oral 3 times per day 02/25/13 1214     02/25/13 1230  cefTRIAXone (ROCEPHIN) 2 g in dextrose 5 % 50 mL IVPB     2 g 100 mL/hr over 30 Minutes Intravenous Daily 02/25/13 1214     02/21/13 2359  levofloxacin (LEVAQUIN) IVPB 750 mg  Status:  Discontinued     750 mg 100 mL/hr over 90 Minutes Intravenous Every 24 hours 02/21/13 0658 02/21/13 1302   02/21/13 2000  vancomycin (VANCOCIN) IVPB 1000 mg/200 mL premix  Status:  Discontinued     1,000 mg 200 mL/hr over 60 Minutes Intravenous Every 12 hours 02/21/13 0708 02/21/13 1535   02/21/13 1400  imipenem-cilastatin (PRIMAXIN) 500 mg in sodium chloride 0.9 % 100 mL IVPB  Status:  Discontinued     500 mg 200 mL/hr over 30 Minutes Intravenous Every 6 hours 02/21/13 1318 02/25/13 1214   02/21/13 0800  vancomycin (VANCOCIN) IVPB 1000 mg/200 mL premix     1,000 mg 200 mL/hr over 60 Minutes Intravenous  Once 02/21/13 0659 02/21/13 0941   02/21/13 0430  vancomycin (VANCOCIN) IVPB 1000 mg/200 mL premix     1,000 mg 200 mL/hr over 60 Minutes Intravenous  Once 02/21/13 0429 02/21/13 0624   02/21/13 0100  levofloxacin (LEVAQUIN) IVPB 750 mg     750 mg 100 mL/hr over 90 Minutes Intravenous   Once 02/21/13 0052 02/21/13 0417      Assessment/Plan: s/p left thigh abscess drainage 10/10; f/u CT today with no residual abscess noted; will monitor drain output for additional 24 hours and if minimal plan to remove drain   LOS: 8 days    ALLRED,D [facility] 02/28/2013

## 2013-02-28 NOTE — Clinical Documentation Improvement (Signed)
THIS DOCUMENT IS NOT A PERMANENT PART OF THE MEDICAL RECORD  Please update your documentation with the medical record to reflect your response to this query. If you need help knowing how to do this please call [phone].  02/28/13  Dear Dr. Jomarie Longs, [name] [name],  In a better effort to capture your patient's severity of illness, reflect appropriate length of stay and utilization of resources, a review of the patient medical record has revealed the following indicators.    Based on your clinical judgment, please clarify and document in a progress note and/or discharge summary the clinical condition associated with the following supporting information:  In responding to this query please exercise your independent judgment.  The fact that a query is asked, does not imply that any particular answer is desired or expected.    Pt with cellulitis, sacral decub stage IV, UTI Enterobacter, suprapubic cath  Pt has several abnormal clinical indicators noted in medical records  WBCs=19.4, 16.8,11.9,14.4,16.6,15.3  UA=Nitrite=Positive and Leukocytes=Moderate  Urine Culture=.=100,000 Enterobacter / BAC= Many / WBC=11-20  week's worth of = 100.25F or = 38C per CN 02/22/12  Treatment:  Begin Primaxin 500 mg IV q6h 2.Per RPh note 02/21/13  Continue vancomycin 1 gram IV q12h. Per RPh note 02/21/13  cefTRIAXone (ROCEPHIN) 2 g in dextrose 5 % 50 mL  metroNIDAZOLE (FLAGYL) tablet 500 mg  Clarification Needed   Please clarify if the clinical indicators listed above can be further specified as one of the diagnoses listed below and document in pn or d/c summary.       Possible Clinical Conditions?  Septicemia / Sepsis Severe Sepsis Neutropenic Sepsis  SIRS Septic Shock Sepsis with UTI Sepsis due to an internal device (  ) Bacterial infection of unknown etiology / source Other Condition __________________ Cannot clinically Determine _______________  Risk Factors:  Presenting  Signs and Symptoms: PN 10/15 Suprapubic catheter has been on/off many abx since sept last year with many infections has been off abx about 2 weeks comes in with fever.  RN changed suprapubic catheter 10/13 but new catheter with leakage and not draining well, I d/w Urology Dr.Wrenn 10/13, Marchelle Folks with Urology following and may upsize catheter today   Diagnostics: Temps 99.2 on 02/21/13 100 on 02/22/13  Pulse Rate 100 and 102 on 02/23/13 and 02/24/13  Cultures: See above Treatment: See above Reviewed:  no additional documentation provided ljh  Thank You,  Enis Slipper  RN, BSN, MSN/Inf, CCDS Clinical Documentation Specialist [facility] Pager: [phone] / E-mail: [email or web address]  [phone] Health Information Management [place]

## 2013-03-01 DIAGNOSIS — E871 Hypo-osmolality and hyponatremia: Secondary | ICD-10-CM

## 2013-03-01 LAB — CBC
Hemoglobin: 7.5 g/dL — ABNORMAL LOW (ref 13.0–17.0)
MCH: 23 pg — ABNORMAL LOW (ref 26.0–34.0)
MCHC: 29.9 g/dL — ABNORMAL LOW (ref 30.0–36.0)
MCV: 77 fL — ABNORMAL LOW (ref 78.0–100.0)
Platelets: 559 10*3/uL — ABNORMAL HIGH (ref 150–400)
RBC: 3.26 MIL/uL — ABNORMAL LOW (ref 4.22–5.81)
RDW: 22 % — ABNORMAL HIGH (ref 11.5–15.5)
WBC: 14.3 10*3/uL — ABNORMAL HIGH (ref 4.0–10.5)

## 2013-03-01 NOTE — Progress Notes (Signed)
JP drain removed By IR PA.

## 2013-03-01 NOTE — Progress Notes (Signed)
7 Days Post-Op  Subjective: Pt without new c/o  Objective: Vital signs in last 24 hours: Temp:  [98.3 F (36.8 C)-99.8 F (37.7 C)] 98.7 F (37.1 C) (10/17 1300) Pulse Rate:  [85-109] 98 (10/17 1300) Resp:  [14-22] 18 (10/17 1300) BP: (110-127)/(76-84) 127/77 mmHg (10/17 1300) SpO2:  [95 %-98 %] 97 % (10/17 1300) Last BM Date: 03/01/13  Intake/Output from previous day: 10/16 0701 - 10/17 0700 In: 1528 [P.O.:1300; I.V.:218] Out: 5770 [Urine:5365; Drains:55; Stool:350] Intake/Output this shift: Total I/O In: 1100 [P.O.:960; I.V.:140] Out: 965 [Urine:725; Drains:40; Stool:200]  Left thigh drain intact, output about 40 cc's today serous appearing fluid, no further flushes Lab Results:   Recent Labs  02/28/13 0500 03/01/13 0445  WBC 15.3* 14.3*  HGB 8.0* 7.5*  HCT 26.9* 25.1*  PLT 575* 559*   BMET  Recent Labs  02/27/13 0555 02/28/13 0500  NA 139 138  K 3.6 3.8  CL 101 102  CO2 29 29  GLUCOSE 108* 123*  BUN 15 16  CREATININE 0.42* 0.54  CALCIUM 9.2 8.8   PT/INR No results found for this basename: LABPROT, INR,  in the last 72 hours ABG No results found for this basename: PHART, PCO2, PO2, HCO3,  in the last 72 hours  Studies/Results: Ct Pelvis W Contrast  02/28/2013   CLINICAL DATA:  Status post left thigh abscess drainage. Increasing white count.  EXAM: CT PELVIS WITH CONTRAST  TECHNIQUE: Multidetector CT imaging of the pelvis was performed using the standard protocol following the bolus administration of intravenous contrast.  CONTRAST:  OMNIPAQUE IOHEXOL 300 MG/ML  SOLN  COMPARISON:  02/21/2013  FINDINGS: Interval placement of a percutaneous drain into the left thigh abscess, located in the region of the upper rectus femoris and vastus. No residual fluid collection identified. Unchanged subcutaneous reticulation and skin thickening involving the left thigh.  The remainder of the scan is stable. There is chronic fragmentation and osteolysis around the  bilateral hip joints, which communicate with large posterior decubitus ulcers. The volume of gas and soft tissue stranding in around the hip joints is stable  No new intra-abdominal findings to explain the increasing white count. There is a suprapubic catheter and descending diverting colostomy.  IMPRESSION: 1. No residual left thigh abscess. A percutaneous drain remains central to the decompressed collection. 2. Similar extent of left thigh cellulitis. 3. 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   Electronically Signed   By: Tiburcio Pea M.D.   On: 02/28/2013 03:58    Anti-infectives: Anti-infectives   Start     Dose/Rate Route Frequency Ordered Stop   02/25/13 1400  metroNIDAZOLE (FLAGYL) tablet 500 mg     500 mg Oral 3 times per day 02/25/13 1214     02/25/13 1230  cefTRIAXone (ROCEPHIN) 2 g in dextrose 5 % 50 mL IVPB     2 g 100 mL/hr over 30 Minutes Intravenous Daily 02/25/13 1214     02/21/13 2359  levofloxacin (LEVAQUIN) IVPB 750 mg  Status:  Discontinued     750 mg 100 mL/hr over 90 Minutes Intravenous Every 24 hours 02/21/13 0658 02/21/13 1302   02/21/13 2000  vancomycin (VANCOCIN) IVPB 1000 mg/200 mL premix  Status:  Discontinued     1,000 mg 200 mL/hr over 60 Minutes Intravenous Every 12 hours 02/21/13 0708 02/21/13 1535   02/21/13 1400  imipenem-cilastatin (PRIMAXIN) 500 mg in sodium chloride 0.9 % 100 mL IVPB  Status:  Discontinued  500 mg 200 mL/hr over 30 Minutes Intravenous Every 6 hours 02/21/13 1318 02/25/13 1214   02/21/13 0800  vancomycin (VANCOCIN) IVPB 1000 mg/200 mL premix     1,000 mg 200 mL/hr over 60 Minutes Intravenous  Once 02/21/13 0659 02/21/13 0941   02/21/13 0430  vancomycin (VANCOCIN) IVPB 1000 mg/200 mL premix     1,000 mg 200 mL/hr over 60 Minutes Intravenous  Once 02/21/13 0429 02/21/13 0624   02/21/13 0100  levofloxacin (LEVAQUIN) IVPB 750 mg     750 mg 100 mL/hr over 90 Minutes Intravenous   Once 02/21/13 0052 02/21/13 0417      Assessment/Plan: s/p left thigh abscess drainage 10/10; f/u CT today with no residual abscess noted;  Persistent serosanguinous output is likely from drain irritation itself. Drain flushed with 5cc NS, immediate return of the same and then no further output Drain removed without difficulty. Sterile dressing applied.  LOS: 9 days    Brayton El 03/01/2013

## 2013-03-01 NOTE — Progress Notes (Signed)
Patient ID: [name] [name]  male  NWG:[id number]    DOB: 14-Dec-1965    DOA: 02/20/2013  PCP: [name], MD  Assessment/Plan:  1. L anterior thigh abscess with large decubitus wounds and some air tracking to R and L Hip  -Off IV Vanc, was on Primaxin for 4 days, CCS signed off  -s/p IR drain of thigh abscess 10/10, IR following, IR to re-image Thigh later this week to determine adequacy of drainage  -Wound Cx with moderate Ecoli pansensitive,  - stopped Primaxin 10/13 and now on Ceftriaxone and Flagyl from 10/13 for 6 weeks total  -ID/Dr. Algis Liming to arrange [facility] in the next 4 weeks  -Repeat CT scan of the pelvis-No residual left thigh abscess, INR to follow for further evaluation and removal of drain.   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 Chronic indwelling foley  -UA abnormal appearing difficult to differentiate UTI vs Colonization since pt has no sensations  -U CX with Enterobacter, likely colonizer per ID given 1 dose fosfomycin 10/13 and stopped Imipenem 10/13  -RN changed suprapubic catheter 10/13 but new catheter with leakage and not draining well, Dr Donna Bernard discussed with Urology Dr.Wrenn 10/13.  - patient was also started on Levsin by urology  4. Quadriplegia  -following C5 fracture (1991)   5. Severe protein calorie malnutrition  -albumin 1.7  6. Reactive thrombocytosis   7. Anemia from ongoing oozing and blood loss from large decub  - Hemoglobin down to 7.5 again from 8.0 yesterday  DVT Prophylaxis:  Code  Status:  Disposition:    Subjective: Denies any specific complaints, no acute issues overnight  Objective: Weight change:   Intake/Output Summary (Last 24 hours) at 03/01/13 1159 Last data filed at 03/01/13 0900  Gross per 24 hour  Intake   1528 ml  Output   4645 ml  Net  -3117 ml   Blood pressure 125/84, pulse 85, temperature 98.3 F (36.8 C), temperature source Oral, resp. rate 14, height 6' (1.829 m), weight 104.781 kg (231 lb), SpO2 98.00%.  Physical Exam: General: Alert and awake, oriented x3, not in any acute distress. CVS: S1-S2 clear Chest: clear to auscultation anteriorly Abdomen: Morbidly obese soft, NBS Extremities: no c/c Neuro: Quadriplegic  Lab Results: Basic Metabolic Panel:  Recent Labs Lab 02/27/13 0555 02/28/13 0500  NA 139 138  K 3.6 3.8  CL 101 102  CO2 29 29  GLUCOSE 108* 123*  BUN 15 16  CREATININE 0.42* 0.54  CALCIUM 9.2 8.8   Liver Function Tests: No results found for this basename: AST, ALT, ALKPHOS, BILITOT, PROT, ALBUMIN,  in the last 168 hours No results found for this basename: LIPASE, AMYLASE,  in the last 168 hours No results found for this basename: AMMONIA,  in the last 168 hours CBC:  Recent Labs Lab 02/28/13 0500 03/01/13 0445  WBC 15.3* 14.3*  HGB 8.0* 7.5*  HCT 26.9* 25.1*  MCV 77.1* 77.0*  PLT 575* 559*   Cardiac Enzymes: No results found for this basename: CKTOTAL, CKMB, CKMBINDEX, TROPONINI,  in the last 168 hours BNP: No  components found with this basename: POCBNP,  CBG: No results found for this basename: GLUCAP,  in the last 168 hours   Micro Results: Recent Results (from the past 240 hour(s))  URINE CULTURE     Status: None   Collection Time    02/20/13  7:34 PM      Result Value Range Status   Specimen Description URINE, CATHETERIZED   Final   Special Requests NONE   Final   Culture  Setup Time     Final   Value: 02/21/2013 02:28     Performed at [facility] Count     Final    Value: >=100,000 COLONIES/ML     Performed at [facility]   Culture     Final   Value: ENTEROBACTER CLOACAE     Performed at [facility]   Report Status 02/24/2013 FINAL   Final   Organism ID, Bacteria ENTEROBACTER CLOACAE   Final  MRSA PCR SCREENING     Status: None   Collection Time    02/21/13  1:41 PM      Result Value Range Status   MRSA by PCR NEGATIVE  NEGATIVE Final   Comment:            The GeneXpert MRSA Assay (FDA     approved for NASAL specimens     only), is one component of a     comprehensive MRSA colonization     surveillance program. It is not     intended to diagnose MRSA     infection nor to guide or     monitor treatment for     MRSA infections.  CULTURE, ROUTINE-ABSCESS     Status: None   Collection Time    02/22/13  1:21 PM      Result Value Range Status   Specimen Description ABSCESS LEFT THIGH   Final   Special Requests Normal   Final   Gram Stain     Final   Value: ABUNDANT WBC PRESENT,BOTH PMN AND MONONUCLEAR     NO SQUAMOUS EPITHELIAL CELLS SEEN     NO ORGANISMS SEEN     Performed at [facility]   Culture     Final   Value: MODERATE ESCHERICHIA COLI     Performed at [facility]   Report Status 02/25/2013 FINAL   Final   Organism ID, Bacteria ESCHERICHIA COLI   Final  ANAEROBIC CULTURE     Status: None   Collection Time    02/22/13  1:21 PM      Result Value Range Status   Specimen Description ABSCESS LEFT THIGH   Final   Special Requests Normal   Final   Gram Stain     Final   Value: ABUNDANT WBC PRESENT,BOTH PMN AND MONONUCLEAR     NO SQUAMOUS EPITHELIAL CELLS SEEN     NO ORGANISMS SEEN     Performed at [facility]   Culture     Final   Value: NO ANAEROBES ISOLATED     Performed at [facility]   Report Status 02/27/2013 FINAL   Final    Studies/Results: Ct Pelvis W Contrast  02/28/2013   CLINICAL DATA:  Status post left thigh abscess drainage. Increasing white count.  EXAM:  CT PELVIS WITH CONTRAST  TECHNIQUE: Multidetector CT imaging of the pelvis was performed using the standard protocol following the bolus administration of intravenous contrast.  CONTRAST:   OMNIPAQUE IOHEXOL 300 MG/ML  SOLN  COMPARISON:  02/21/2013  FINDINGS: Interval placement of a percutaneous drain into the left thigh abscess, located in the region of the upper rectus femoris and vastus. No residual fluid collection identified. Unchanged subcutaneous reticulation and skin thickening involving the left thigh.  The remainder of the scan is stable. There is chronic fragmentation and osteolysis around the bilateral hip joints, which communicate with large posterior decubitus ulcers. The volume of gas and soft tissue stranding in around the hip joints is stable  No new intra-abdominal findings to explain the increasing white count. There is a suprapubic catheter and descending diverting colostomy.  IMPRESSION: 1. No residual left thigh abscess. A percutaneous drain remains central to the decompressed collection. 2. Similar extent of left thigh cellulitis. 3. 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   Electronically Signed   By: Tiburcio Pea M.D.   On: 02/28/2013 03:58   Ct Pelvis W Contrast  02/21/2013   *RADIOLOGY REPORT*  Clinical Data:  Poorly healing bilateral sacral wounds; evaluate wounds.  CT PELVIS WITH CONTRAST  Technique:  Multidetector CT imaging of the pelvis was performed using the standard protocol following the bolus administration of intravenous contrast.  Contrast: OMNIPAQUE IOHEXOL 300 MG/ML  SOLN  Comparison:  CT of the pelvis performed 01/25/2013  Findings:  Since the prior study, there has been interval development of a somewhat complex peripherally enhancing abscess within the anterior left thigh, measuring approximately 6.8 x 3.6 x 5.9 cm.  The degree of soft tissue inflammation within the proximal left thigh has also  increased, reflecting cellulitis.  Note is also made of new tiny foci of air at the left hip joint space, compatible with new extension of the patient's left-sided sacral decubitus ulcers; the degree of soft tissue inflammation at the left hip is largely unchanged, though more peripheral surrounding soft tissue edema appears worsened.  The right hip joint is unchanged in appearance, with a large sacral decubitus ulceration tracking through the hip joint and into a large pocket of air along the anterior right thigh.  There is diffuse destruction of both hip joints, with extensive deformity as previously described.  An unusual osseous extension is again noted arising from the right side of the pubic symphysis, into the anterior right thigh; this appears to reflect a displaced coccyx.  Decubitus ulcerations along both posterior acetabula extend to the bone, as noted on the prior study.  The decubitus ulceration at the sacrum is also unchanged, extending nearly to the bone.  The visualized small and large bowel are grossly unremarkable in appearance.  The bladder is decompressed, with a suprapubic catheter noted in unchanged position.  IMPRESSION:  1.  New 6.8 x 3.6 x 5.9 cm peripherally enhancing abscess in the anterior left thigh, with new cellulitis involving much of the left thigh. 2.  New tiny foci of air noted at the left hip joint space, compatible with new extension of the left-sided sacral decubitus ulcers; surrounding soft tissue edema appears worsened. 3.  Very large right-sided sacral decubitus ulceration unchanged in appearance, with air tracking through the right hip joint and into the anterior right thigh.  Decubitus ulcerations along both posterior acetabula and at the sacrum are essentially unchanged.  These results were called by telephone on 02/21/2013 at 02:10 a.m. to the clinical team at [facility], who verbally acknowledged these results.   Original Report Authenticated By: Tonia Ghent, M.D.  [place] Abscess Drain  02/22/2013   CLINICAL DATA:  Quadriplegia and development of abscess in the anterior left thigh.  EXAM: ULTRASOUND GUIDED DRAINAGE OF THE LEFT THIGHS SOFT TISSUE ABSCESS WITH PLACEMENT OF INDWELLING CATHETER  MEDICATIONS: No IV conscious sedation was administered.  PROCEDURE: The procedure, risks, benefits, and alternatives were explained to the patient. Questions regarding the procedure were encouraged and answered. The patient understands and consents to the procedure.  The left thigh was prepped with Betadine in a sterile fashion, and a sterile drape was applied covering the operative field. A sterile gown and sterile gloves were used for the procedure. Local anesthesia was provided with 1% Lidocaine.  Ultrasound was used to localize an anterior left thigh abscess. Under ultrasound guidance, an 18 gauge trocar needle was advanced into the collection. Aspiration of fluid was performed and a sample sent for culture.  A guidewire was advanced through the needle. The needle was removed and the percutaneous tract dilated over the wire. A 10 French pigtail drainage catheter was advanced into the collection. The catheter was formed and flushed with saline. It was then connected to a suction bulb. The catheter was secured at the skin with a Prolene retention suture and adhesive stat lock device.  COMPLICATIONS: None.  FINDINGS: Ultrasound demonstrates an elongated and complex abscess of the anterior left thigh. Aspiration yielded grossly purulent fluid. After placement of the drainage catheter, there was immediate return of a copious amount of purulent fluid.  IMPRESSION: Ultrasound-guided percutaneous drainage of left thigh soft tissue abscess. A 10 French drain was placed and attached to suction bulb drainage. A fluid sample was sent for culture analysis.   Electronically Signed   By: Irish Lack M.D.   On: 02/22/2013 16:36    Medications: Scheduled Meds: . baclofen  20 mg Oral Q6H  .  cefTRIAXone (ROCEPHIN)  IV  2 g Intravenous Q1200  . docusate sodium  100 mg Oral BID  . enoxaparin (LOVENOX) injection  40 mg Subcutaneous Q24H  . famotidine  20 mg Oral BID  . feeding supplement (ENSURE COMPLETE)  237 mL Oral BID BM  . ferrous fumarate-b12-vitamic C-folic acid  1 capsule Oral TID PC  . metoCLOPramide  5 mg Oral TID AC  . metroNIDAZOLE  500 mg Oral Q8H  . multivitamin with minerals  1 tablet Oral Daily  . nutrition supplement (JUVEN)  1 packet Oral BID BM  . potassium chloride  40 mEq Oral Daily  . vitamin C  500 mg Oral Daily  . zinc sulfate  220 mg Oral Daily      LOS: 9 days   RAI,RIPUDEEP M.D. [facility] 03/01/2013, 11:59 AM Pager: [phone]  If 7PM-7AM, please contact night-coverage [email or web address] Password [email or web address]

## 2013-03-02 DIAGNOSIS — Z9359 Other cystostomy status: Secondary | ICD-10-CM

## 2013-03-02 DIAGNOSIS — E669 Obesity, unspecified: Secondary | ICD-10-CM

## 2013-03-02 MED ORDER — HEPARIN SOD (PORK) LOCK FLUSH 100 UNIT/ML IV SOLN: 250.0000 [IU] | INTRAVENOUS | Status: DC | PRN

## 2013-03-02 MED ORDER — HYOSCYAMINE SULFATE 0.125 MG PO TABS: 0.1250 mg | ORAL_TABLET | ORAL | Status: DC | PRN

## 2013-03-02 MED ORDER — DEXTROSE 5 % IV SOLN: 2.0000 g | Freq: Every day | INTRAVENOUS | Status: AC

## 2013-03-02 MED ORDER — METRONIDAZOLE 500 MG PO TABS: 500.0000 mg | ORAL_TABLET | Freq: Three times a day (TID) | ORAL | Status: AC

## 2013-03-02 NOTE — Progress Notes (Signed)
Subjective: 8 Days Post-Op Procedure(s) (LRB): Surgery Canceled (Left) Patient reports pain as 0 on 0-10 scale.    Objective: Vital signs in last 24 hours: Temp:  [98.4 F (36.9 C)-99.6 F (37.6 C)] 98.4 F (36.9 C) (10/18 0539) Pulse Rate:  [87-108] 87 (10/18 0539) Resp:  [16-20] 16 (10/18 0539) BP: (127-138)/(77-90) 138/90 mmHg (10/18 0539) SpO2:  [94 %-98 %] 98 % (10/18 0539)  Intake/Output from previous day: 10/17 0701 - 10/18 0700 In: 3340 [P.O.:2880; I.V.:460] Out: 6092 [Urine:5850; Drains:40; Stool:202] Intake/Output this shift: Total I/O In: 480 [P.O.:480] Out: -    Recent Labs  02/28/13 0500 03/01/13 0445  HGB 8.0* 7.5*    Recent Labs  02/28/13 0500 03/01/13 0445  WBC 15.3* 14.3*  RBC 3.49* 3.26*  HCT 26.9* 25.1*  PLT 575* 559*    Recent Labs  02/28/13 0500  NA 138  K 3.8  CL 102  CO2 29  BUN 16  CREATININE 0.54  GLUCOSE 123*  CALCIUM 8.8   No results found for this basename: LABPT, INR,  in the last 72 hours  C5 quad.   Assessment/Plan: 8 Days Post-Op Procedure(s) (LRB): Surgery Canceled (Left) Discharge home with home health  His drain is out , dressing is dry.  Cultures E.coli  YATES,MARK C 03/02/2013, 9:36 AM

## 2013-03-02 NOTE — Progress Notes (Signed)
D/c instructions given to pt and caregiver.  No questions/concerns voiced by either.  Pt assessment unchanged and will be d/c'd home with a suprapubic catheter, colostomy, and PICC line.  All equipment delivered per caregiver and scripts and d/c instruction packet sent with [facility]AR and pt.

## 2013-03-02 NOTE — Progress Notes (Addendum)
Asked by MD to arrange for ambulance transport home.  Pt verified his address and stated that he has someone in the home to receive him.  LM for [name] [name] at [facility] notifying her that Pt will be transported home via ambulance, per MD request, and providing her with Pt's dob and Subscriber #.  Providence Crosby, LCSWA Clinical Social Work [phone]

## 2013-03-02 NOTE — Progress Notes (Signed)
Notified [facility] of pt's dc home today with IV abx and dressing changes needed. Isidoro Donning RN CCM Case Mgmt phone [phone]

## 2013-03-02 NOTE — Discharge Summary (Signed)
Physician Discharge Summary  [name] [name] RUE:[id number] DOB: 06-Dec-1965 DOA: 02/20/2013  PCP: [name], MD  Admit date: 02/20/2013 Discharge date: 03/02/2013  Time spent: >30 minutes  Recommendations for Outpatient Follow-up:  Follow-up Information   Follow up with Acey Lav, MD. (as directed in 4weeks)    Specialty:  Infectious Diseases   Contact information:   [address] [place] [address] [phone]       Follow up with Eldred Manges, MD. (as needed)    Specialty:  Orthopedic Surgery   Contact information:   [address] [place] [address] [phone]       Follow up with [name], MD. (in 1-2weeks, call for appt upon discharge)    Specialty:  Internal Medicine   Contact information:   [address] [place] [address] [phone]        Discharge Diagnoses:  Principal Problem:   Cellulitis and abscess of leg Active Problems:   Quadriplegia   Hyponatremia   Seizure disorder   Sacral decubitus ulcer, stage IV   Discharge Condition: improved/stable  Diet recommendation: regular  Filed Weights   02/25/13 2100  Weight: 104.781 kg (231 lb)    History of present illness:  47 yo male quadraplegic, htn, chronic osteo pelvis, stage 4 sacral wounds has been on/off many abx since sept last year with many infections has been off abx about 2 weeks comes in with fever. Decub wounds started draining again and getting red, a culture was done at the snf he was told it grew out something. hes been running fever for over 24 hours so was sent to ED. No n/v/d. Also has chronic utis. No cough.      Hospital Course:  1.  Escherichia coli L anterior thigh abscess with large decubitus wounds and some air tracking to R and L Hip -as discussed above upon admission patient was placed on empiric broad-spectrum IV antibiotics and infectious disease was consulted - upon followup surgery and  orthopedics were consulted and saw the patient for further evaluation and recommended drainage of abscess by interventional radiology - patient initially received a dose of vancomycin, was on Primaxinfor 4 days through10/13  this was discontinued.the abscess was drained per interventional radiology on 10/10 and drain left in place and I are followed. The cultures from the abscess grew Escherichia coli infectious disease followed and patient was placed on Ceftriaxone and Flagyl from 10/13  And theyrecommended 6 weeks total other antibiotics (beginning 10/13) -patient has remained afebrile and is hemodynamically stable,14.3 today prior to discharge - [facility] followed and signed off  -IR followed and Repeat CT scan of the pelvis was done 10/15 -No residual left thigh abscess.  -patient was seen by IR on 10/17 and drain removed. -ID/Dr. Algis Liming followed recommended antibiotics as above and signed off, to arrange HSFU in the next 4 weeks  -home health RN setup for outpatient IV antibiotics. 2. 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  3. Chronic indwelling foley  -UA abnormal  appearing difficult to differentiate UTI vs Colonization since pt has no sensations  -U CX with Enterobacter, likely colonizer per ID given 1 dose fosfomycin 10/13 and stopped Imipenem 10/13  -RN changed suprapubic catheter 10/13 but new catheter with leakage and not draining well, Dr. Jomarie Longs d/w Urology Dr.Wrenn 10/13, Urology RN following and  may upsizeD catheteris was recommended. 4. Quadriplegia  -following C5 fracture (1991)  5. Severe protein calorie malnutrition  -albumin 1.7, Nutrition consult the patient was maintained on nutrtional supplements during this hospital stay. 6. Reactive thrombocytosis  7. Anemia from ongoing oozing and blood loss from large decub  -hb down to 6.6 on 10/13, transfused 1 unit PRBC , Hemoglobin stable at 7.5 today prior to discharge. 8. Hypokalemia  -resolved,potassium was replaced the hospital.      Procedures: Ultrasound guided drainage of left thigh abscess-on 10/10 per Dr. Fredia Sorrow   Consultants:  [facility]  Orthopedics Infectious disease  Discharge Exam: Filed Vitals:   03/02/13 1354  BP: 118/79  Pulse: 98  Temp: 98.5 F (36.9 C)  Resp: 16  Exam:  General: AAOx3, no distress  Cardiovascular:S1S2/RRR  Respiratory: CTAB  Abdomen: soft, Nt, obese, BS present Musculoskeletal: contractures involving arms and legs.  Neuro:Quadriplegic   Discharge Instructions  Discharge Orders   Future Appointments Provider Department Dept Phone   03/04/2013 9:45 AM [facility] [phone]   03/25/2013 2:00 PM Ginnie Smart, MD [facility] [phone]   Future Orders Complete By Expires   Diet general  As directed    Increase activity slowly  As directed        Medication List         albuterol (2.5 MG/3ML) 0.083% nebulizer solution  Commonly known as:  PROVENTIL  Take 2.5 mg by nebulization every 6 (six) hours as needed for wheezing or shortness of breath. Shortness of breath     amLODipine 10 MG tablet  Commonly known as:  NORVASC  Take 0.5 tablets (5 mg total) by mouth every morning.     baclofen 20 MG tablet  Commonly known as:  LIORESAL  Take 20 mg by mouth 4 (four) times daily.     dextrose 5 % SOLN 50 mL with cefTRIAXone 2 G SOLR 2 g  Inject 2 g into the vein daily at 12  noon.     docusate sodium 100 MG capsule  Commonly known as:  COLACE  Take 100 mg by mouth 2 (two) times daily.     famotidine 20 MG tablet  Commonly known as:  PEPCID  Take 20 mg by mouth 2 (two) times daily.     FERRALET 90 PO  Take 1 capsule by mouth every morning.     ferrous sulfate 325 (65 FE) MG tablet  Take 325 mg by mouth 3 (three) times daily with meals.     furosemide 20 MG tablet  Commonly known as:  LASIX  Take 20 mg by mouth every morning.     hyoscyamine 0.125 MG tablet  Commonly known as:  LEVSIN, ANASPAZ  Take 1 tablet (0.125 mg total) by mouth every 4 (four) hours as needed.     metoCLOPramide 10 MG tablet  Commonly known as:  REGLAN  Take 1 tablet (10 mg total) by mouth 3 (three) times daily before meals.     metroNIDAZOLE 500 MG tablet  Commonly known as:  FLAGYL  Take 1 tablet (500 mg total) by mouth  every 8 (eight) hours.     multivitamin with minerals Tabs tablet  Take 1 tablet by mouth every morning.     nutrition supplement (JUVEN) Pack  Take 1 packet by mouth 2 (two) times daily between meals.     feeding supplement (ENSURE COMPLETE) Liqd  Take 237 mLs by mouth 2 (two) times daily between meals.     potassium chloride SA 20 MEQ tablet  Commonly known as:  K-DUR,KLOR-CON  Take 20 mEq by mouth every morning.     vitamin C 500 MG tablet  Commonly known as:  ASCORBIC ACID  Take 500 mg by mouth every morning.     zinc sulfate 220 MG capsule  Take 220 mg by mouth every morning.       Allergies  Allergen Reactions  . Ditropan [Oxybutynin] Other (See Comments)    hallucinations       Follow-up Information   Follow up with Acey Lav, MD. (as directed)    Specialty:  Infectious Diseases   Contact information:   [address] [place] [address] [phone]       Follow up with Eldred Manges, MD. (as needed)    Specialty:  Orthopedic Surgery   Contact information:   [address] [place] [address] [phone]       Follow up with [name], MD. (in 1-2weeks, call for appt upon discharge)    Specialty:  Internal Medicine   Contact information:   [address] [place] [address] [phone]        The results of significant diagnostics from this hospitalization (including imaging, microbiology, ancillary and laboratory) are listed below for reference.    Significant Diagnostic Studies: Ct Pelvis W Contrast  02/28/2013   CLINICAL DATA:  Status post left thigh abscess drainage. Increasing white count.  EXAM: CT PELVIS WITH CONTRAST  TECHNIQUE: Multidetector CT imaging of the pelvis was performed using the standard protocol following the bolus administration of intravenous contrast.  CONTRAST:  OMNIPAQUE IOHEXOL 300 MG/ML  SOLN  COMPARISON:  02/21/2013  FINDINGS: Interval placement of a percutaneous drain into the left thigh abscess, located in the region of the upper rectus femoris and vastus. No residual fluid collection identified. Unchanged subcutaneous reticulation and skin thickening involving the left thigh.  The remainder of the scan is stable. There is chronic fragmentation and osteolysis around the bilateral hip joints, which communicate with large posterior decubitus ulcers. The volume of gas and soft tissue stranding in around the hip joints is stable  No new intra-abdominal findings to explain the increasing white count. There is a suprapubic catheter and descending diverting colostomy.  IMPRESSION: 1. No residual left thigh abscess. A percutaneous drain remains central to the decompressed collection. 2. Similar extent of left thigh cellulitis. 3. 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   Electronically Signed   By: Tiburcio Pea M.D.   On: 02/28/2013 03:58   Ct Pelvis W Contrast  02/21/2013   *RADIOLOGY REPORT*  Clinical Data:  Poorly healing bilateral sacral wounds;  evaluate wounds.  CT PELVIS WITH CONTRAST  Technique:  Multidetector CT imaging of the pelvis was performed using the standard protocol following the bolus administration of intravenous contrast.  Contrast: OMNIPAQUE IOHEXOL 300 MG/ML  SOLN  Comparison:  CT of the pelvis performed 01/25/2013  Findings:  Since the prior study, there has been interval development of a  somewhat complex peripherally enhancing abscess within the anterior left thigh, measuring approximately 6.8 x 3.6 x 5.9 cm.  The degree of soft tissue inflammation within the proximal left thigh has also increased, reflecting cellulitis.  Note is also made of new tiny foci of air at the left hip joint space, compatible with new extension of the patient's left-sided sacral decubitus ulcers; the degree of soft tissue inflammation at the left hip is largely unchanged, though more peripheral surrounding soft tissue edema appears worsened.  The right hip joint is unchanged in appearance, with a large sacral decubitus ulceration tracking through the hip joint and into a large pocket of air along the anterior right thigh.  There is diffuse destruction of both hip joints, with extensive deformity as previously described.  An unusual osseous extension is again noted arising from the right side of the pubic symphysis, into the anterior right thigh; this appears to reflect a displaced coccyx.  Decubitus ulcerations along both posterior acetabula extend to the bone, as noted on the prior study.  The decubitus ulceration at the sacrum is also unchanged, extending nearly to the bone.  The visualized small and large bowel are grossly unremarkable in appearance.  The bladder is decompressed, with a suprapubic catheter noted in unchanged position.  IMPRESSION:  1.  New 6.8 x 3.6 x 5.9 cm peripherally enhancing abscess in the anterior left thigh, with new cellulitis involving much of the left thigh. 2.  New tiny foci of air noted at the left hip joint space,  compatible with new extension of the left-sided sacral decubitus ulcers; surrounding soft tissue edema appears worsened. 3.  Very large right-sided sacral decubitus ulceration unchanged in appearance, with air tracking through the right hip joint and into the anterior right thigh.  Decubitus ulcerations along both posterior acetabula and at the sacrum are essentially unchanged.  These results were called by telephone on 02/21/2013 at 02:10 a.m. to the clinical team at [facility], who verbally acknowledged these results.   Original Report Authenticated By: Tonia Ghent, M.D.   [place] Abscess Drain  02/22/2013   CLINICAL DATA:  Quadriplegia and development of abscess in the anterior left thigh.  EXAM: ULTRASOUND GUIDED DRAINAGE OF THE LEFT THIGHS SOFT TISSUE ABSCESS WITH PLACEMENT OF INDWELLING CATHETER  MEDICATIONS: No IV conscious sedation was administered.  PROCEDURE: The procedure, risks, benefits, and alternatives were explained to the patient. Questions regarding the procedure were encouraged and answered. The patient understands and consents to the procedure.  The left thigh was prepped with Betadine in a sterile fashion, and a sterile drape was applied covering the operative field. A sterile gown and sterile gloves were used for the procedure. Local anesthesia was provided with 1% Lidocaine.  Ultrasound was used to localize an anterior left thigh abscess. Under ultrasound guidance, an 18 gauge trocar needle was advanced into the collection. Aspiration of fluid was performed and a sample sent for culture.  A guidewire was advanced through the needle. The needle was removed and the percutaneous tract dilated over the wire. A 10 French pigtail drainage catheter was advanced into the collection. The catheter was formed and flushed with saline. It was then connected to a suction bulb. The catheter was secured at the skin with a Prolene retention suture and adhesive stat lock device.  COMPLICATIONS: None.   FINDINGS: Ultrasound demonstrates an elongated and complex abscess of the anterior left thigh. Aspiration yielded grossly purulent fluid. After placement of the drainage catheter, there was immediate return of a  copious amount of purulent fluid.  IMPRESSION: Ultrasound-guided percutaneous drainage of left thigh soft tissue abscess. A 10 French drain was placed and attached to suction bulb drainage. A fluid sample was sent for culture analysis.   Electronically Signed   By: Irish Lack M.D.   On: 02/22/2013 16:36    Microbiology: Recent Results (from the past 240 hour(s))  URINE CULTURE     Status: None   Collection Time    02/20/13  7:34 PM      Result Value Range Status   Specimen Description URINE, CATHETERIZED   Final   Special Requests NONE   Final   Culture  Setup Time     Final   Value: 02/21/2013 02:28     Performed at [facility] Count     Final   Value: >=100,000 COLONIES/ML     Performed at [facility]   Culture     Final   Value: ENTEROBACTER CLOACAE     Performed at [facility]   Report Status 02/24/2013 FINAL   Final   Organism ID, Bacteria ENTEROBACTER CLOACAE   Final  MRSA PCR SCREENING     Status: None   Collection Time    02/21/13  1:41 PM      Result Value Range Status   MRSA by PCR NEGATIVE  NEGATIVE Final   Comment:            The GeneXpert MRSA Assay (FDA     approved for NASAL specimens     only), is one component of a     comprehensive MRSA colonization     surveillance program. It is not     intended to diagnose MRSA     infection nor to guide or     monitor treatment for     MRSA infections.  CULTURE, ROUTINE-ABSCESS     Status: None   Collection Time    02/22/13  1:21 PM      Result Value Range Status   Specimen Description ABSCESS LEFT THIGH   Final   Special Requests Normal   Final   Gram Stain     Final   Value: ABUNDANT WBC PRESENT,BOTH PMN AND MONONUCLEAR     NO SQUAMOUS EPITHELIAL CELLS SEEN     NO  ORGANISMS SEEN     Performed at [facility]   Culture     Final   Value: MODERATE ESCHERICHIA COLI     Performed at [facility]   Report Status 02/25/2013 FINAL   Final   Organism ID, Bacteria ESCHERICHIA COLI   Final  ANAEROBIC CULTURE     Status: None   Collection Time    02/22/13  1:21 PM      Result Value Range Status   Specimen Description ABSCESS LEFT THIGH   Final   Special Requests Normal   Final   Gram Stain     Final   Value: ABUNDANT WBC PRESENT,BOTH PMN AND MONONUCLEAR     NO SQUAMOUS EPITHELIAL CELLS SEEN     NO ORGANISMS SEEN     Performed at [facility]   Culture     Final   Value: NO ANAEROBES ISOLATED     Performed at [facility]   Report Status 02/27/2013 FINAL   Final     Labs: Basic Metabolic Panel:  Recent Labs Lab 02/25/13 0605 02/26/13 0415 02/27/13 0555 02/28/13 0500  NA 137 140 139  138  K 2.6* 3.0* 3.6 3.8  CL 104 104 101 102  CO2 28 29 29 29   GLUCOSE 98 106* 108* 123*  BUN 7 11 15 16   CREATININE 0.40* 0.39* 0.42* 0.54  CALCIUM 8.0* 8.7 9.2 8.8   Liver Function Tests: No results found for this basename: AST, ALT, ALKPHOS, BILITOT, PROT, ALBUMIN,  in the last 168 hours No results found for this basename: LIPASE, AMYLASE,  in the last 168 hours No results found for this basename: AMMONIA,  in the last 168 hours CBC:  Recent Labs Lab 02/25/13 0605 02/26/13 0415 02/27/13 0555 02/28/13 0500 03/01/13 0445  WBC 11.9* 14.4* 16.6* 15.3* 14.3*  HGB 6.6* 8.1* 8.1* 8.0* 7.5*  HCT 22.2* 26.6* 27.4* 26.9* 25.1*  MCV 75.0* 76.2* 76.3* 77.1* 77.0*  PLT 548* 612* 580* 575* 559*   Cardiac Enzymes: No results found for this basename: CKTOTAL, CKMB, CKMBINDEX, TROPONINI,  in the last 168 hours BNP: BNP (last 3 results) No results found for this basename: PROBNP,  in the last 8760 hours CBG: No results found for this basename: GLUCAP,  in the last 168 hours     Signed:  Alisea Matte C  [facility] 03/02/2013, 2:59 PM

## 2013-03-03 NOTE — Progress Notes (Signed)
Late entry--03/02/2013 1530 NCM contacted Caregiver, Darl Pikes. She will be in the home to receive pt after dc. Contacted [facility] about dsg changes and IV abx needed. Faxed RX to [facility] for IV abx. Isidoro Donning RN CCM Case Mgmt phone [phone]

## 2013-03-04 ENCOUNTER — Encounter (HOSPITAL_BASED_OUTPATIENT_CLINIC_OR_DEPARTMENT_OTHER): Payer: Medicare Other | Attending: Plastic Surgery

## 2013-03-04 DIAGNOSIS — L98499 Non-pressure chronic ulcer of skin of other sites with unspecified severity: Secondary | ICD-10-CM | POA: Insufficient documentation

## 2013-03-04 NOTE — Progress Notes (Signed)
Discharge summary sent to payer through [facility]  

## 2013-03-21 ENCOUNTER — Other Ambulatory Visit: Payer: Self-pay

## 2013-03-25 ENCOUNTER — Ambulatory Visit (INDEPENDENT_AMBULATORY_CARE_PROVIDER_SITE_OTHER): Payer: Medicare Other | Admitting: Infectious Diseases

## 2013-03-25 ENCOUNTER — Encounter: Payer: Self-pay | Admitting: Infectious Diseases

## 2013-03-25 ENCOUNTER — Telehealth: Payer: Self-pay | Admitting: *Deleted

## 2013-03-25 VITALS — BP 116/82 | HR 94 | Temp 98.6°F | Ht 72.0 in

## 2013-03-25 DIAGNOSIS — N390 Urinary tract infection, site not specified: Secondary | ICD-10-CM

## 2013-03-25 DIAGNOSIS — D509 Iron deficiency anemia, unspecified: Secondary | ICD-10-CM

## 2013-03-25 DIAGNOSIS — Z23 Encounter for immunization: Secondary | ICD-10-CM

## 2013-03-25 DIAGNOSIS — L8994 Pressure ulcer of unspecified site, stage 4: Secondary | ICD-10-CM

## 2013-03-25 DIAGNOSIS — L89109 Pressure ulcer of unspecified part of back, unspecified stage: Secondary | ICD-10-CM

## 2013-03-25 DIAGNOSIS — E43 Unspecified severe protein-calorie malnutrition: Secondary | ICD-10-CM

## 2013-03-25 DIAGNOSIS — L89154 Pressure ulcer of sacral region, stage 4: Secondary | ICD-10-CM

## 2013-03-25 NOTE — Assessment & Plan Note (Signed)
Will have nutrition see him to assist with his low Alb.

## 2013-03-25 NOTE — Progress Notes (Signed)
  Subjective:    Patient ID: [name] [name], male    DOB: January 07, 1966, 47 y.o.   MRN: [id number]  HPI 47 y.o. M with paraplegia (C5 fracture 1991) and nonhealing sacral wound for the past 3 years. He has had multiple prolonged courses of antibiotic therapy recently. He was hospitalized on March 19 right hip osteomyelitis. Was transferred to [facility] where a hip aspirate grew methicillin sensitive coagulase-negative staph and Candida. He received a long course of IV ertapenem and oral fluconazole. He was readmitted from April 19-25 and switched to vancomycin and ertapenem and received continued therapy. He was admitted again from June 25 to July 2. Bone biopsy grew Enterobacter and he was started on ertapenem again received 8 weeks of therapy completing that on September 1st. He was seen by Dr. Orvan Falconer and Dr. Drue Second and they felt more likely admission complaints of fever, etc were related to pseudomonas UTI rather than his chronic pelvic infection.  He now returned 02-20-13 with fevers. He was found on CT to have an abscess 6.8 x 3.6 x 5.9 c with gas in hip joint, left side, with no changes in chronic right sacral decub and air tracking to right hip joint. He had urine and blood cultures drawn and placed on vancomycin and imipenem.  His UCx grew > 100k E cloacae (S-aminoglycosides, FLQ, Bactrim) for which he received fosfomycin.  His abscess was aspirated by IR on 02-22-13 and grew E coli (R flq and bactrim). He was treated with IV ceftriaxone and oral flagyl with a plan for 6 weeks of therapy (end Nov 19).  He denies any problems.   Review of Systems  Constitutional: Negative for fever, chills and appetite change.  Gastrointestinal: Negative for diarrhea.  Genitourinary: Negative for dysuria and hematuria.  Musculoskeletal:       No problems with PIC line.        Objective:   Physical Exam  Constitutional: He appears well-developed and well-nourished.  HENT:    Mouth/Throat: No oropharyngeal exudate.  Eyes: EOM are normal. Pupils are equal, round, and reactive to light.  Cardiovascular: Normal rate, regular rhythm and normal heart sounds.   Pulmonary/Chest: Effort normal and breath sounds normal.  Abdominal: Soft. Bowel sounds are normal. There is no tenderness.  Lower abd supra-pubic catheter. Some d/c around os. Non-expressed.   Musculoskeletal:       Arms:         Assessment & Plan:

## 2013-03-25 NOTE — Assessment & Plan Note (Signed)
Slightly improved at his most recent lab (8.7/27.6)

## 2013-03-25 NOTE — Telephone Encounter (Addendum)
Spoke with [facility] pharmacy, gave them the stop date for antibiotics and verbal order to pull his PICC.  Andree Coss, RN

## 2013-03-25 NOTE — Assessment & Plan Note (Addendum)
He is improved. Will continue his anbx with the plan that he will stop anbx on 04-03-13. Will have home health pull his PIC then as well. He has f/u with wound care. Will see him back in 1 month.

## 2013-03-25 NOTE — Assessment & Plan Note (Signed)
No recent changes in urine. Will repeat cx only if he has sx, fever or chills.

## 2013-03-28 DIAGNOSIS — Z23 Encounter for immunization: Secondary | ICD-10-CM

## 2013-04-09 ENCOUNTER — Telehealth: Payer: Self-pay | Admitting: Licensed Clinical Social Worker

## 2013-04-09 NOTE — Telephone Encounter (Signed)
RN from [facility] reported patient having 99.8 temperature today when she arrived to pull picc. RN wants to know if Dr. Ninetta Lights would like her to continue to pull picc or evaluate low grade temperature.

## 2013-04-09 NOTE — Telephone Encounter (Signed)
Per Dr. Ninetta Lights, please have [facility] pull the PICC today.  Patient, or [facility], should contact our office if his temperature is greater than 100.8.  If this occurs while the office is closed for the holiday, please call the [facility] MD on call or go to the ED for evaluation. Andree Coss, RN

## 2013-04-24 ENCOUNTER — Ambulatory Visit (INDEPENDENT_AMBULATORY_CARE_PROVIDER_SITE_OTHER): Payer: Medicare Other | Admitting: Infectious Diseases

## 2013-04-24 ENCOUNTER — Encounter: Payer: Self-pay | Admitting: Infectious Diseases

## 2013-04-24 VITALS — Temp 97.7°F

## 2013-04-24 DIAGNOSIS — L89154 Pressure ulcer of sacral region, stage 4: Secondary | ICD-10-CM

## 2013-04-24 DIAGNOSIS — L8994 Pressure ulcer of unspecified site, stage 4: Secondary | ICD-10-CM

## 2013-04-24 DIAGNOSIS — L89109 Pressure ulcer of unspecified part of back, unspecified stage: Secondary | ICD-10-CM

## 2013-04-24 NOTE — Progress Notes (Signed)
   Subjective:    Patient ID: [name] [name], male    DOB: September 03, 1965, 47 y.o.   MRN: [id number]  HPI 47 y.o. M with paraplegia (C5 fracture 1991) and nonhealing sacral wound for the past 3 years. He has had multiple prolonged courses of antibiotic therapy recently. He was hospitalized on March 19 right hip osteomyelitis. Was transferred to [facility] where a hip aspirate grew methicillin sensitive coagulase-negative staph and Candida. He received a long course of IV ertapenem and oral fluconazole. He was readmitted from April 19-25 and switched to vancomycin and ertapenem and received continued therapy. He was admitted again from June 25 to July 2. Bone biopsy grew Enterobacter and he was started on ertapenem again received 8 weeks of therapy completing that on September 1st. He was seen by Dr. Orvan Falconer and Dr. Drue Second and they felt more likely admission complaints of fever, etc were related to pseudomonas UTI rather than his chronic pelvic infection.  He now returned 02-20-13 with fevers. He was found on CT to have an abscess 6.8 x 3.6 x 5.9 c with gas in hip joint, left side, with no changes in chronic right sacral decub and air tracking to right hip joint. He had urine and blood cultures drawn and placed on vancomycin and imipenem.  His UCx grew > 100k E cloacae (S-aminoglycosides, FLQ, Bactrim) for which he received fosfomycin.  His abscess was aspirated by IR on 02-22-13 and grew E coli (R flq and bactrim). He was treated with IV ceftriaxone and oral flagyl with a plan for 6 weeks of therapy (end Nov 19).  He was seen in ID clinic in f/u 03-25-13. He was doing well.  Has noted more d/c and odor over last week since being off anbx. Next wound care appt is end of January. Was seen by home health nurse yesterday who felt this should be expedited. No fever or chills.   Review of Systems     Objective:   Physical Exam  Constitutional: No distress.  Skin: He is not  diaphoretic.             Assessment & Plan:

## 2013-04-24 NOTE — Assessment & Plan Note (Signed)
He is doing well aside from his complaints of of worsening. I do not see worsening on his exam. Agree to try to get him into [facility] sooner. Will see him back in 1 month.

## 2013-04-26 ENCOUNTER — Telehealth: Payer: Self-pay | Admitting: *Deleted

## 2013-04-26 NOTE — Telephone Encounter (Signed)
Spoke with Onalee Hua at [facility]. They will contact patient to work him into the schedule.  First available is 12/22 with Dr. Kelly Splinter. Andree Coss, RN

## 2013-04-26 NOTE — Telephone Encounter (Signed)
Referred [facility] RN to Dr. Charna Elizabeth office for questions r/t wound care questions.  Advised RN that message re:  F/u MRI will be shared with Dr. Ninetta Lights.

## 2013-04-29 NOTE — Telephone Encounter (Signed)
Patient made an appointment at the wound care center for 05/10/13.

## 2013-04-30 ENCOUNTER — Encounter: Payer: Self-pay | Admitting: Infectious Disease

## 2013-05-06 ENCOUNTER — Encounter (HOSPITAL_BASED_OUTPATIENT_CLINIC_OR_DEPARTMENT_OTHER): Payer: Medicare Other | Attending: Plastic Surgery

## 2013-05-06 DIAGNOSIS — L8993 Pressure ulcer of unspecified site, stage 3: Secondary | ICD-10-CM | POA: Insufficient documentation

## 2013-05-06 DIAGNOSIS — L89109 Pressure ulcer of unspecified part of back, unspecified stage: Secondary | ICD-10-CM | POA: Insufficient documentation

## 2013-05-11 ENCOUNTER — Emergency Department (HOSPITAL_COMMUNITY)
Admission: EM | Admit: 2013-05-11 | Discharge: 2013-05-11 | Disposition: A | Discharge status: Home / Self Care | Payer: Medicare Other | Attending: Emergency Medicine | Admitting: Emergency Medicine

## 2013-05-11 ENCOUNTER — Encounter (HOSPITAL_COMMUNITY): Payer: Self-pay | Admitting: Emergency Medicine

## 2013-05-11 DIAGNOSIS — D649 Anemia, unspecified: Secondary | ICD-10-CM | POA: Insufficient documentation

## 2013-05-11 DIAGNOSIS — J45909 Unspecified asthma, uncomplicated: Secondary | ICD-10-CM | POA: Insufficient documentation

## 2013-05-11 DIAGNOSIS — I10 Essential (primary) hypertension: Secondary | ICD-10-CM | POA: Insufficient documentation

## 2013-05-11 DIAGNOSIS — M436 Torticollis: Secondary | ICD-10-CM | POA: Insufficient documentation

## 2013-05-11 DIAGNOSIS — Z79899 Other long term (current) drug therapy: Secondary | ICD-10-CM | POA: Insufficient documentation

## 2013-05-11 DIAGNOSIS — G4733 Obstructive sleep apnea (adult) (pediatric): Secondary | ICD-10-CM | POA: Insufficient documentation

## 2013-05-11 DIAGNOSIS — G8250 Quadriplegia, unspecified: Secondary | ICD-10-CM | POA: Insufficient documentation

## 2013-05-11 DIAGNOSIS — G40909 Epilepsy, unspecified, not intractable, without status epilepticus: Secondary | ICD-10-CM | POA: Insufficient documentation

## 2013-05-11 MED ORDER — HYDROCODONE-ACETAMINOPHEN 5-325 MG PO TABS
1.0000 | ORAL_TABLET | Freq: Once | ORAL | Status: AC
  Administered 2013-05-11: 1 via ORAL
  Filled 2013-05-11: qty 1

## 2013-05-11 MED ORDER — DIAZEPAM 5 MG PO TABS
5.0000 mg | ORAL_TABLET | Freq: Once | ORAL | Status: AC
Start: 2013-05-11 — End: 2013-05-11
  Administered 2013-05-11: 5 mg via ORAL
  Filled 2013-05-11: qty 1

## 2013-05-11 MED ORDER — DIAZEPAM 5 MG PO TABS: 5.0000 mg | ORAL_TABLET | Freq: Two times a day (BID) | ORAL | Status: DC

## 2013-05-11 MED ORDER — HYDROCODONE-ACETAMINOPHEN 5-325 MG PO TABS: 2.0000 | ORAL_TABLET | ORAL | Status: DC | PRN
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
## 2013-05-11 NOTE — ED Notes (Signed)
He states that, as he was being assisted to reposition in bed yesterday (he is a quadriplegic), he felt a "twinge" of pain in his lower right neck.  He states this pain got better, but then recurred and persists.  He is in no distress.

## 2013-05-12 IMAGING — CR DG CHEST 1V PORT
1 series · 1 of 1 positions shown · non-contrast
Comparison: 06/20/2008.

CLINICAL DATA: 45-year-old male with respiratory distress,
shortness of breath.

PORTABLE CHEST - 1 VIEW

[AP]
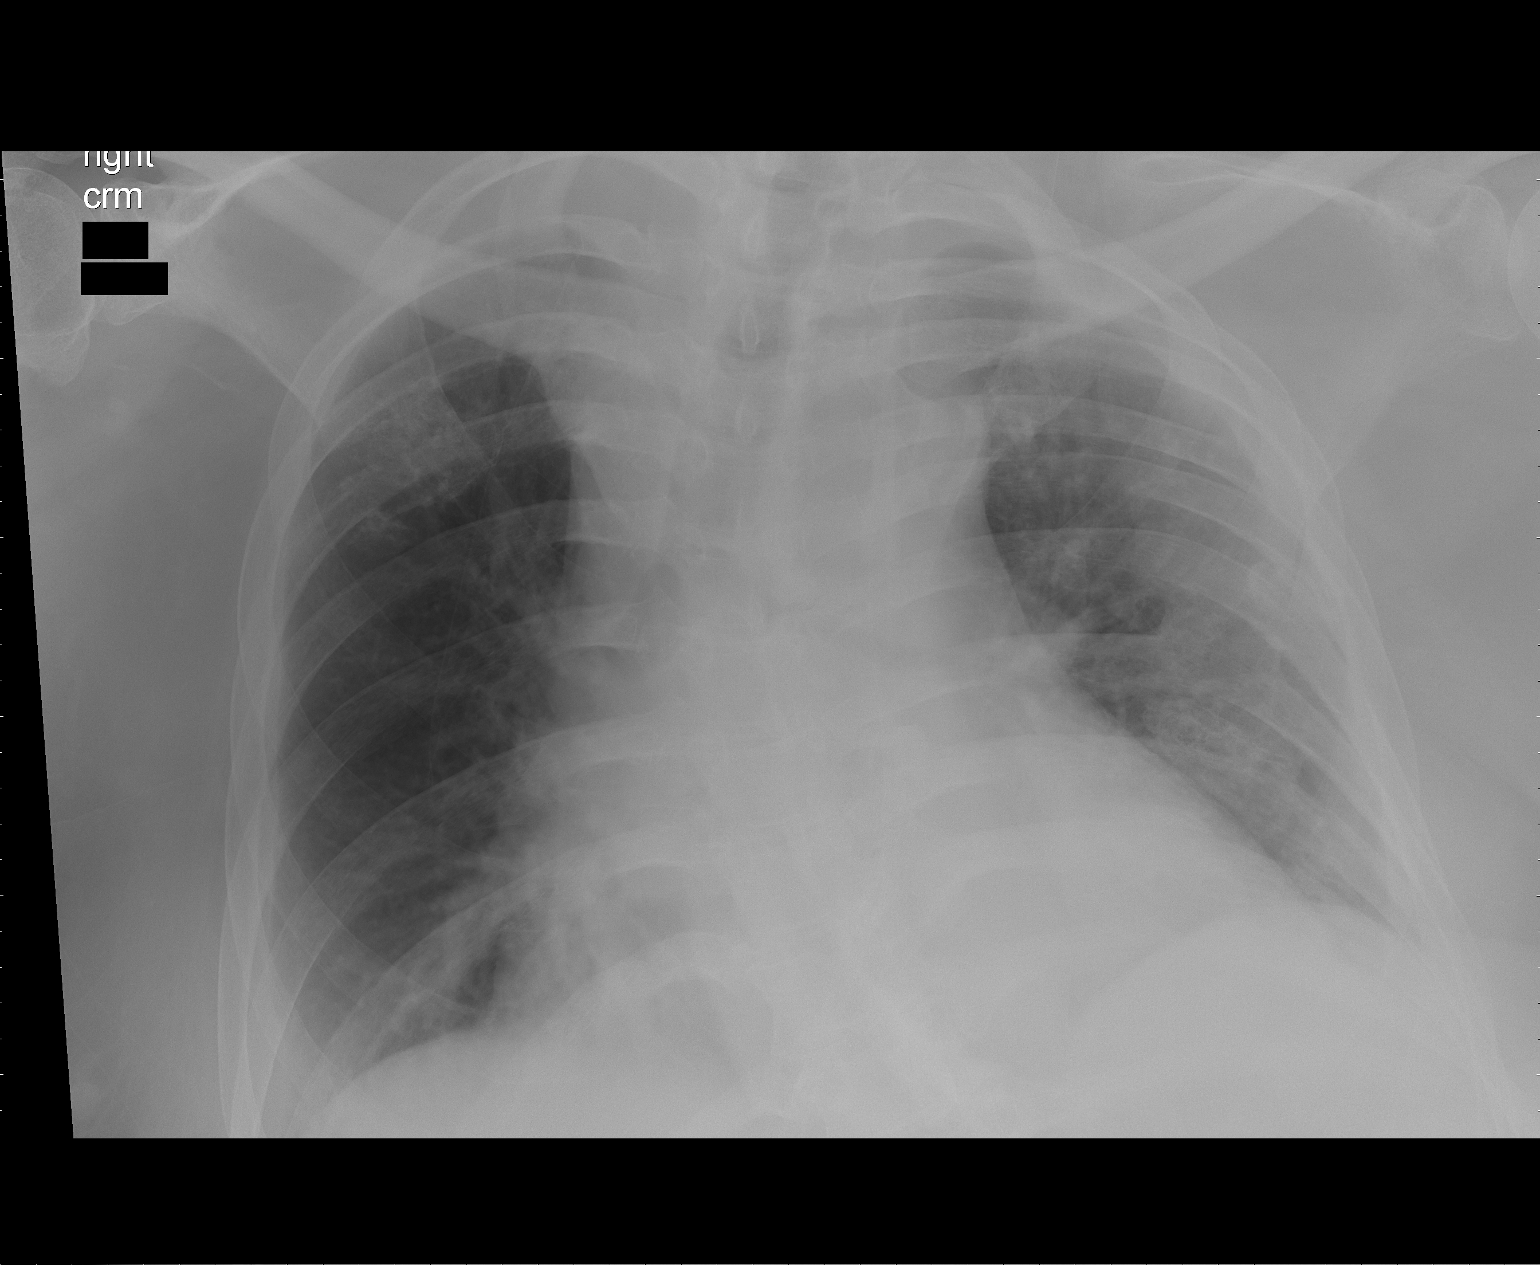

[1 of 1 positions shown; findings below may reference images not displayed]

FINDINGS: Portable upright AP view 9400 hours.  Large body habitus.
Stable mildly improved lung volumes.  Chronic bony fusion in the
left mid anterior ribs account for asymmetric increased opacity in
the left lung.  There is streaky medial right lung base opacity.
Stable mild cardiomegaly and mediastinal contours. Visualized
tracheal air column is within normal limits.  Cerclage wires in the
lower cervical spine.  No pneumothorax, pulmonary edema or
effusion.
IMPRESSION: 1.  Mild streaky right basilar opacity, favor atelectasis.
2.  Chronic left thoracic rib fusion accounting for asymmetric
increased opacity in that lung.

## 2013-05-12 NOTE — ED Provider Notes (Signed)
Medical screening examination/treatment/procedure(s) were performed by non-physician practitioner and as supervising physician I was immediately available for consultation/collaboration.  EKG Interpretation   None         Junius Argyle, MD 05/12/13 [phone]

## 2013-05-14 IMAGING — CR DG CHEST 1V PORT
2 series · 2 of 2 positions shown · non-contrast
Comparison: 09/28/2010

CLINICAL DATA: Pneumonia.  Shortness of breath.

PORTABLE CHEST - 1 VIEW

[AP (1 of 2)]
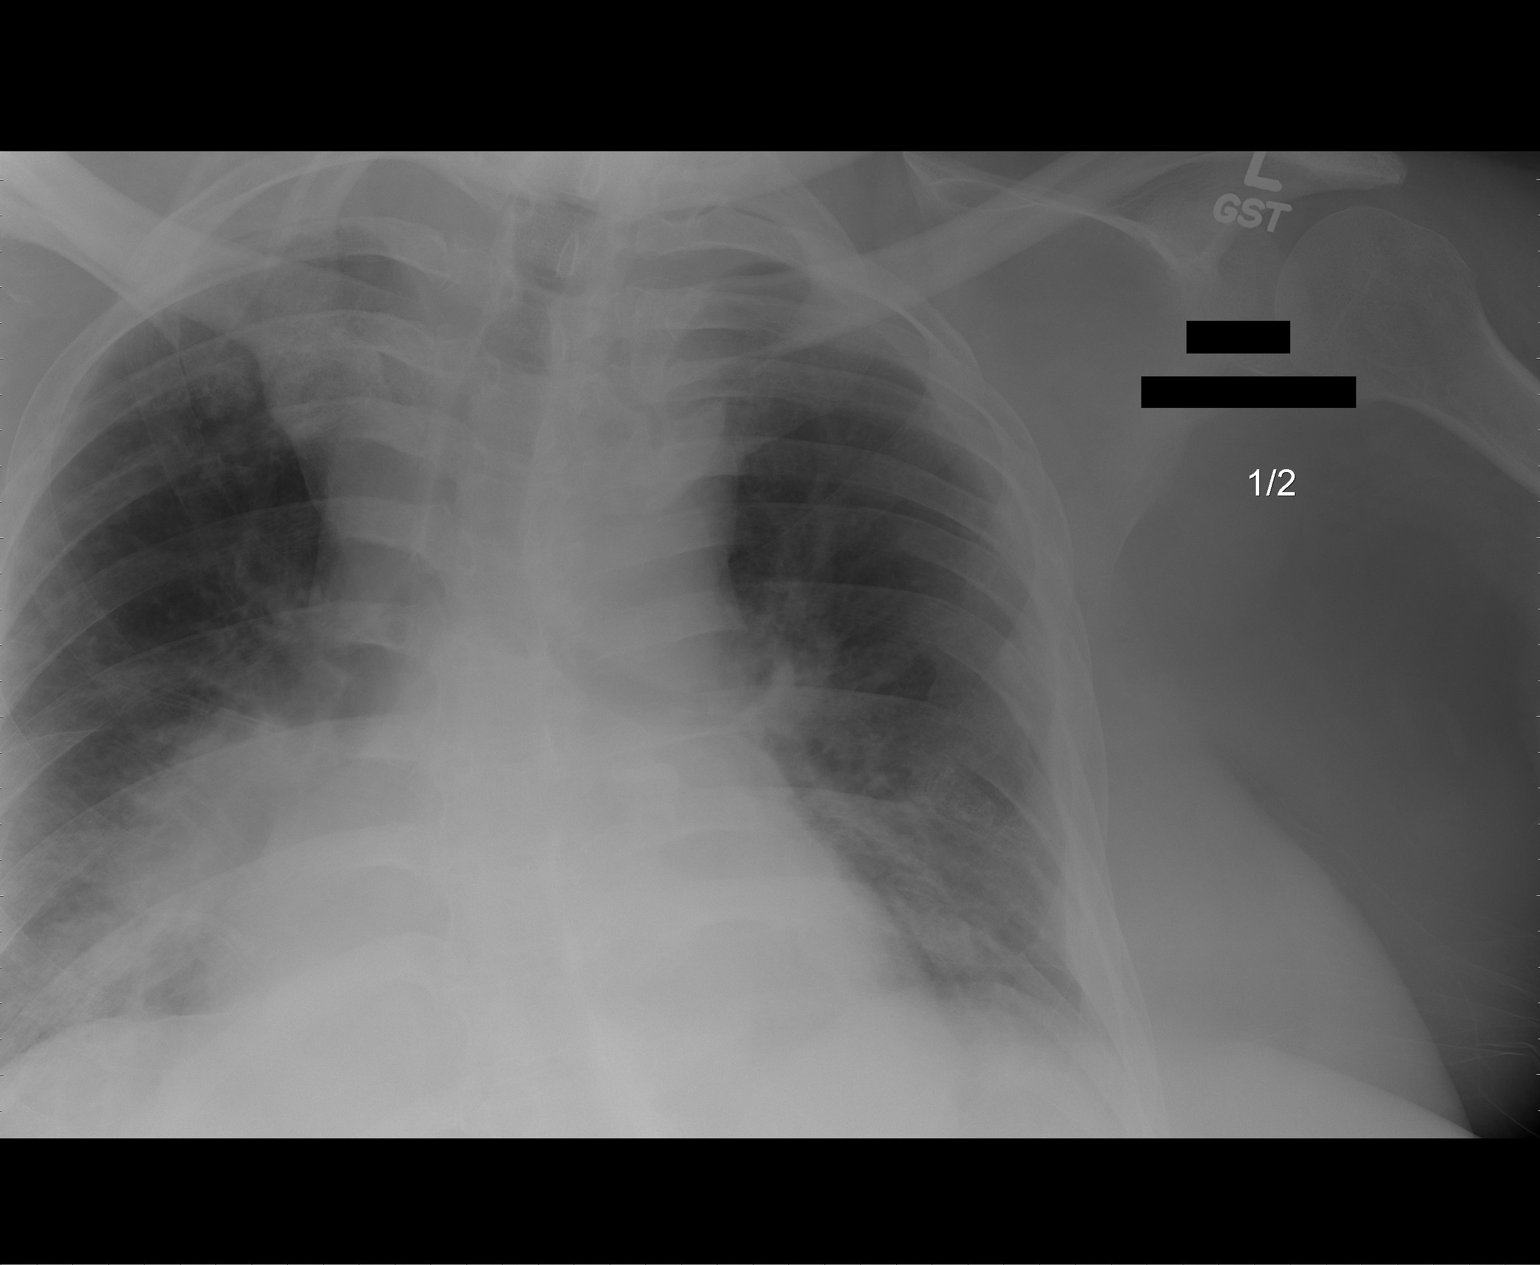

[AP (2 of 2)]
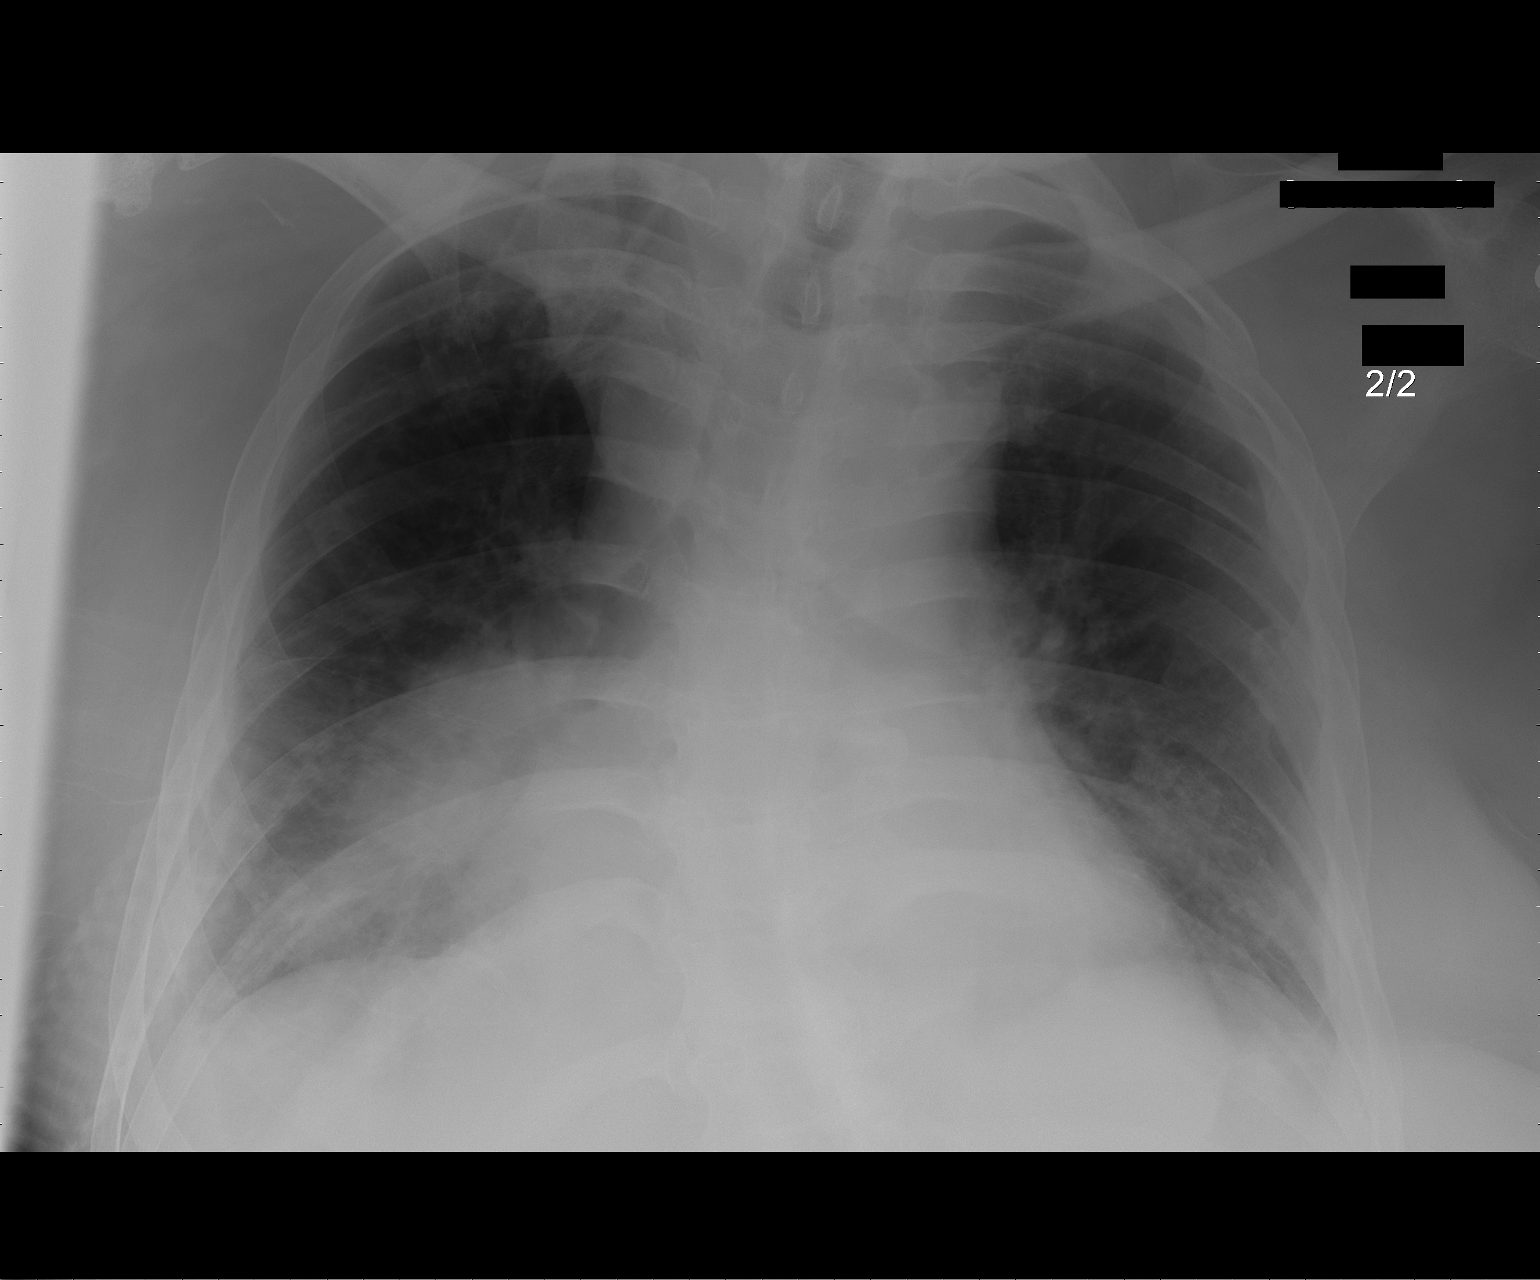

[2 of 2 positions shown; findings below may reference images not displayed]

FINDINGS: Worsening airspace disease is seen in the medial right
lung base.  Opacity in the medial left lung base is unchanged.
Heart size is stable.
IMPRESSION: 1.  Increased airspace disease in the medial right lung base.
2.  No significant change in left basilar opacity.

## 2013-05-14 NOTE — Progress Notes (Signed)
[facility]  NAME:  JB, DULWORTH NO.:  MEDICAL RECORD NO.:  [id number]      DATE OF BIRTH:  Jul 18, 1965  PHYSICIAN:  Wayland Denis, DO       VISIT DATE:  05/13/2013                                  OFFICE VISIT   The patient is a 47 year old gentleman who is here for followup on his sacro-ischial ulcers.  He has undergone debridement in the past, is getting dressing changes at home with home health.  He is very pleasant, alert, oriented, cooperative, not in any distress.  He looks better today than he has in a while.  The wounds are all present, but not as bad as they have been in the past.  The one on his right hip area is still __________ present, but they look clean and they do not look infected.  Recommend continuing with the dressing changes.  We gave him a prescription for __________ multivitamin, vitamin C, and zinc, and followup in 3 weeks.     Wayland Denis, DO     CS/MEDQ  D:  05/13/2013  T:  05/14/2013  Job:  [id number]

## 2013-05-27 ENCOUNTER — Ambulatory Visit (INDEPENDENT_AMBULATORY_CARE_PROVIDER_SITE_OTHER): Payer: Medicare Other | Admitting: Infectious Diseases

## 2013-05-27 ENCOUNTER — Encounter: Payer: Self-pay | Admitting: Infectious Diseases

## 2013-05-27 VITALS — BP 113/85 | HR 89 | Temp 98.5°F

## 2013-05-27 DIAGNOSIS — L89109 Pressure ulcer of unspecified part of back, unspecified stage: Secondary | ICD-10-CM

## 2013-05-27 DIAGNOSIS — L89154 Pressure ulcer of sacral region, stage 4: Secondary | ICD-10-CM

## 2013-05-27 DIAGNOSIS — L8994 Pressure ulcer of unspecified site, stage 4: Secondary | ICD-10-CM
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
## 2013-05-27 NOTE — Assessment & Plan Note (Signed)
He is doing well, it appears he is getting good wound care at home. He is also getting get good wound care at [facility]. I appreciate their regular f/u. He is not planned for flap/plastic reconstruction at this point (to his knowledge).  Will continue to hold any further anbx at this point.  will se him back in 3 months.

## 2013-06-09 IMAGING — CT CT CHEST W/ CM
2 of 3 series · 15 of 36 positions shown, 18 images · IV contrast (agent unspecified)
Comparison: 10/26/2010.

CLINICAL DATA: Fever.  Short of breath.Right hilar mass.

CT CHEST WITH CONTRAST
TECHNIQUE: Multidetector CT imaging of the chest was performed
following the standard protocol during bolus administration of
intravenous contrast.
Contrast: 80 ml 5mnipaque-GPP.

[Series 2: rtn chest with st · axial · 0.79mm/px · z∈[+979,+1264]mm · 12 of 67 slices shown, 15 images]
[im 5/67  mediastinal]
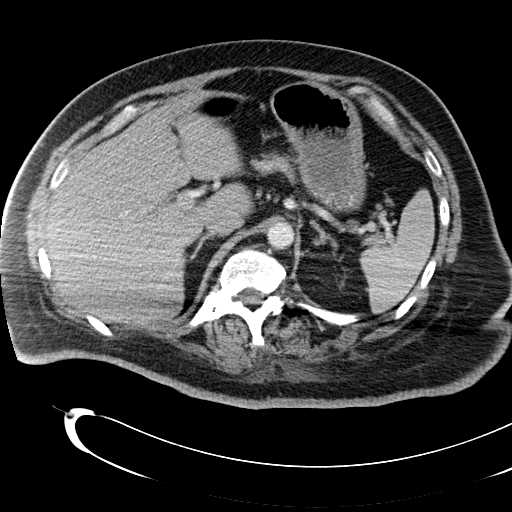
[im 5/67  lung]
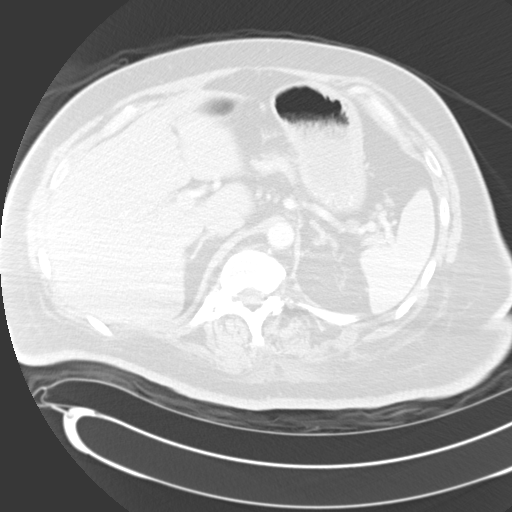
[im 10/67  lung]
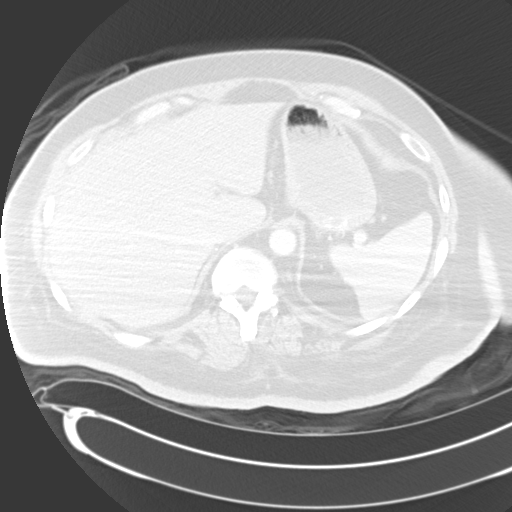
[im 15/67  lung]
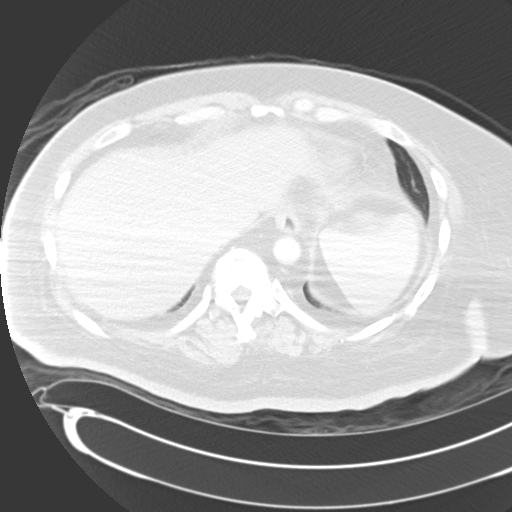
[im 20/67  lung]
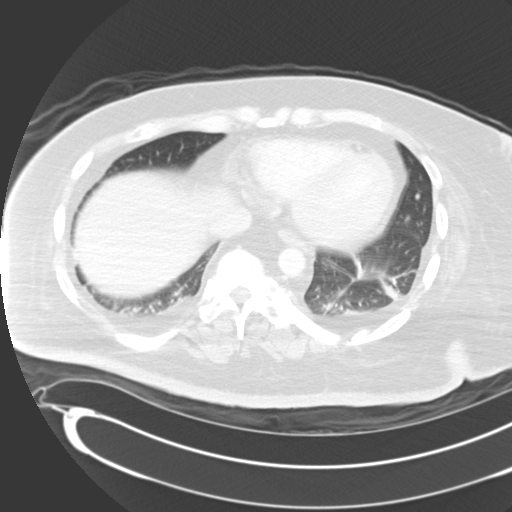
[im 25/67  mediastinal]
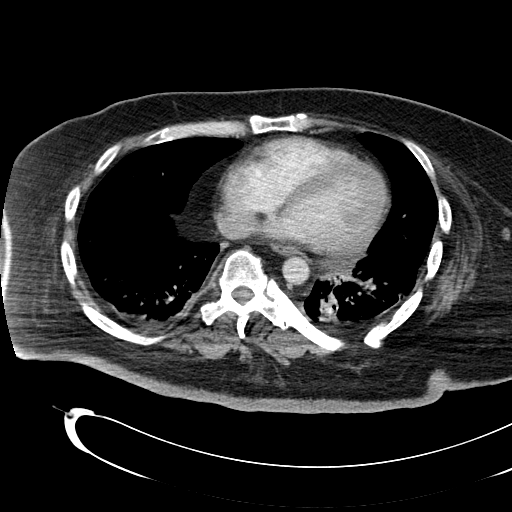
[im 25/67  lung]
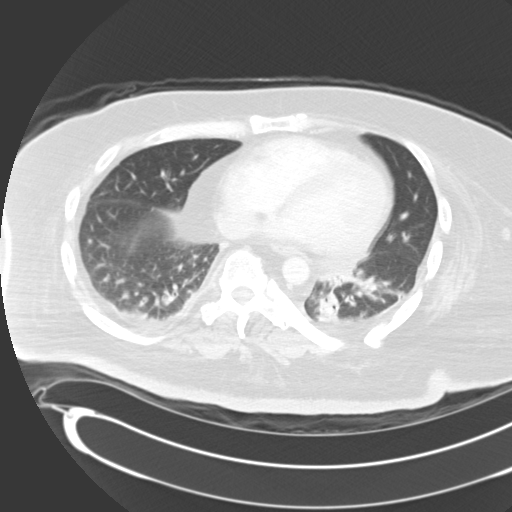
[im 30/67  lung]
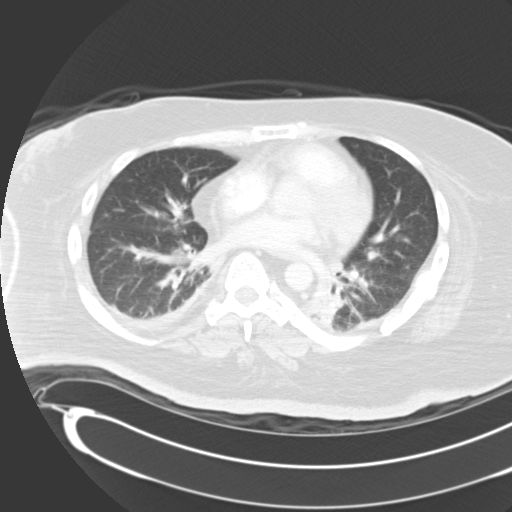
[im 37/67  lung]
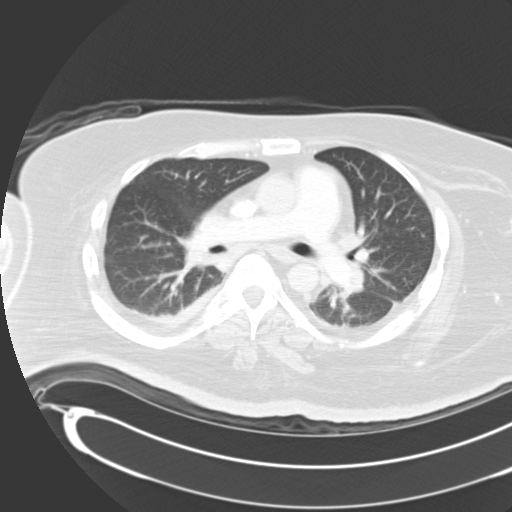
[im 42/67  lung]
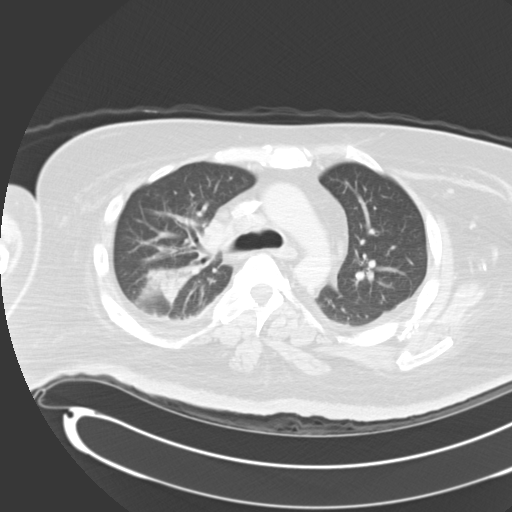
[im 47/67  mediastinal]
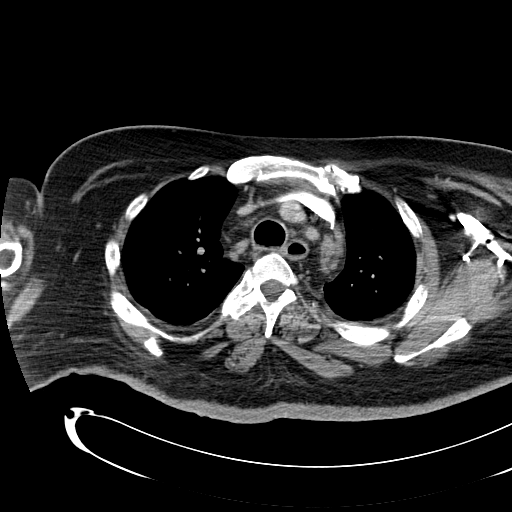
[im 47/67  lung]
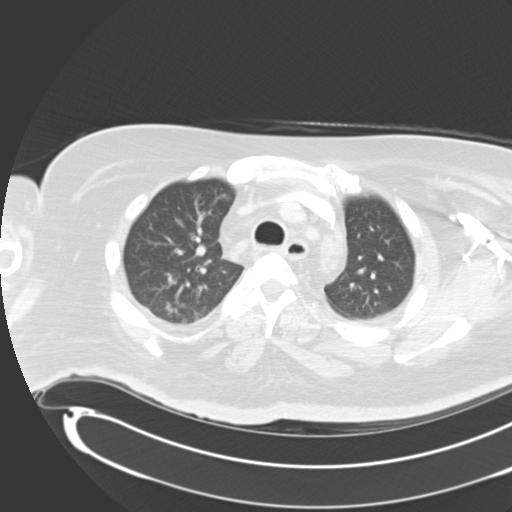
[im 52/67  lung]
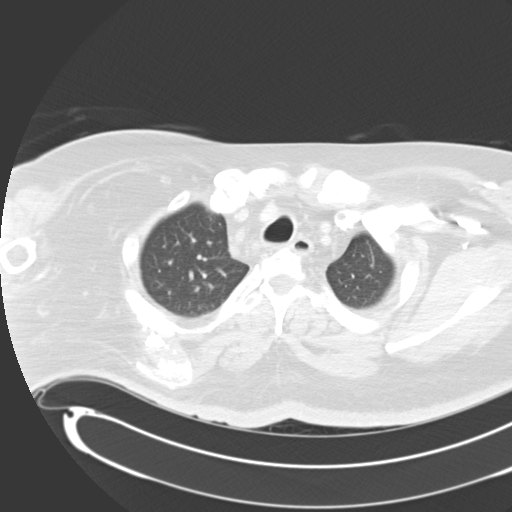
[im 57/67  lung]
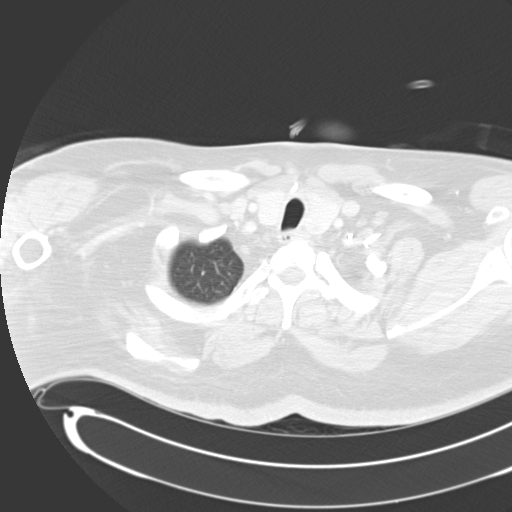
[im 62/67  lung]
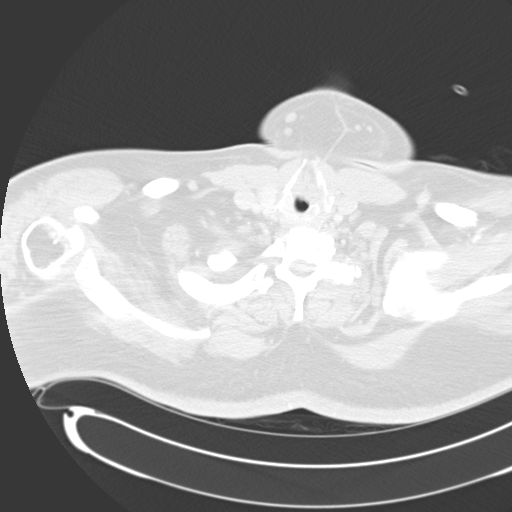

[Series 602: <mpr thick range> · coronal · 0.79mm/px · 3 of 64 slices shown]
[im 13/64  lung]
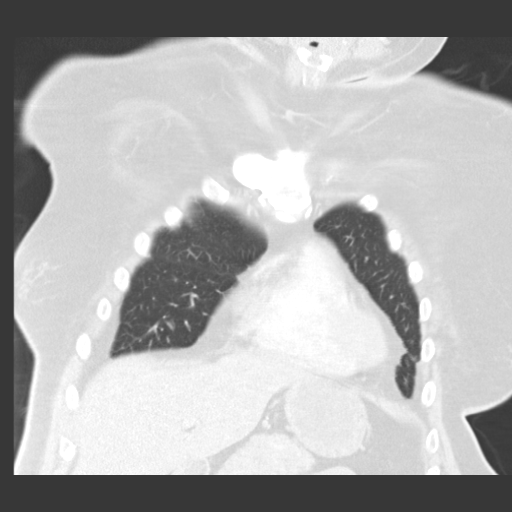
[im 26/64  lung]
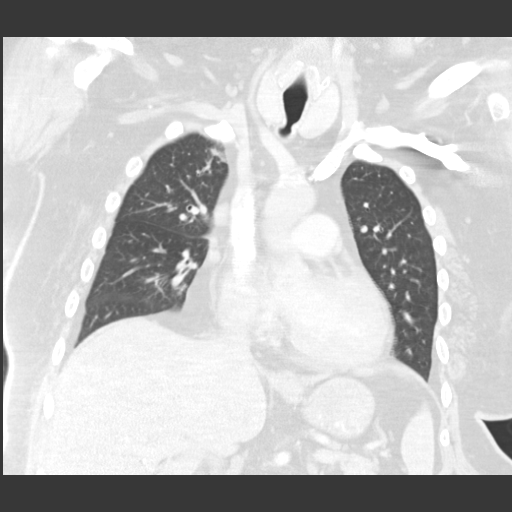
[im 38/64  lung]
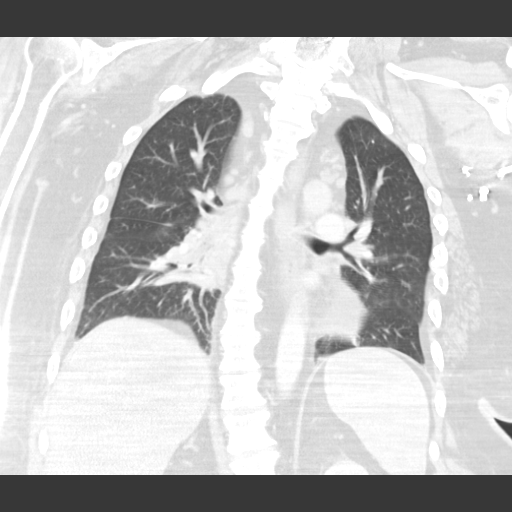

[15 of 36 positions shown; findings below may reference images not displayed]

FINDINGS: Marked dependent atelectasis is present within the lungs.
In the right upper lobe, there is peripheral opacity most
compatible with airspace disease/pneumonia.  Right hilar adenopathy
is present which is probably reactive.  Small right paratracheal
lymph nodes are present, also likely reactive to pneumonia.  Fatty
atrophy of the musculature of the chest.  Incidental imaging of the
upper abdomen is unremarkable.  Postoperative changes of the
cervical spine are partially visualized. Post-traumatic changes to
the left thoracic cage are present with synostosis between multiple
ribs.  Healed medial right clavicle fracture. Aorta and branch
vessels appear within normal limits.
IMPRESSION: 1.  Right upper lobe pneumonia with reactive adenopathy.  Follow-up
to ensure radiographic clearing is recommended.
2.  Marked dependent atelectasis.
3.  Post-traumatic and postoperative changes of the cervical spine
and left thoracic cage.

## 2013-06-09 IMAGING — CR DG CHEST 2V
3 series · 3 of 3 positions shown · non-contrast
Comparison: 09/30/2010

CLINICAL DATA: Fever.  Shortness of breath.

CHEST - 2 VIEW

[w chest lat *]
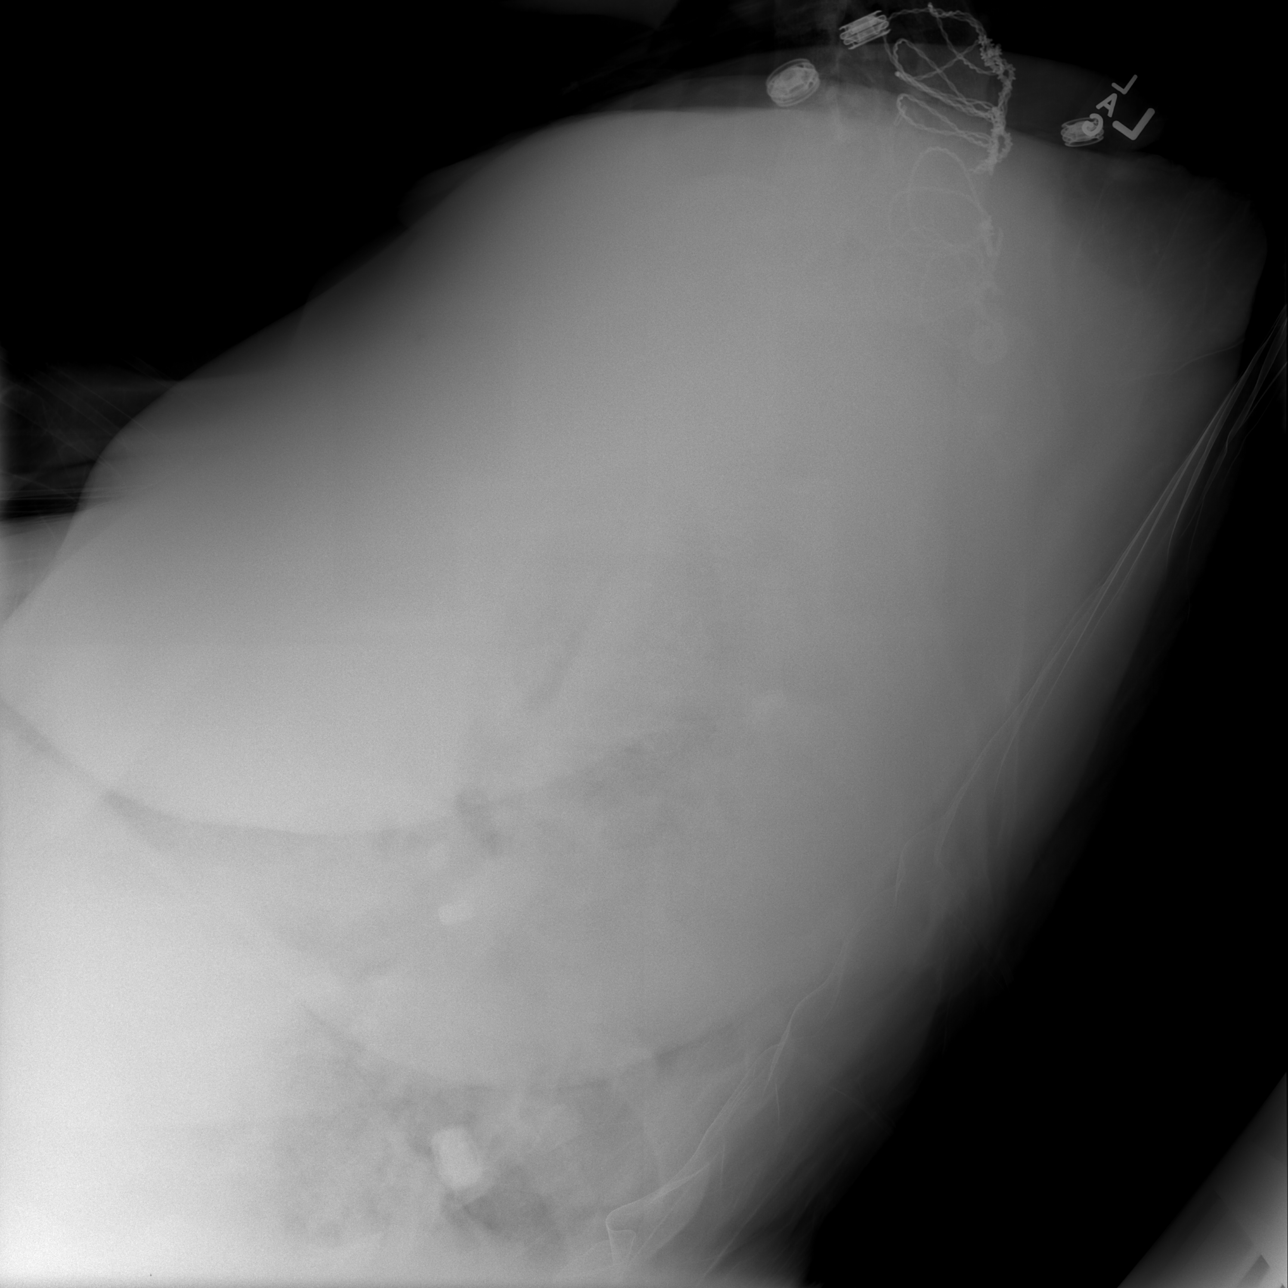

[view not recorded (1 of 2)]
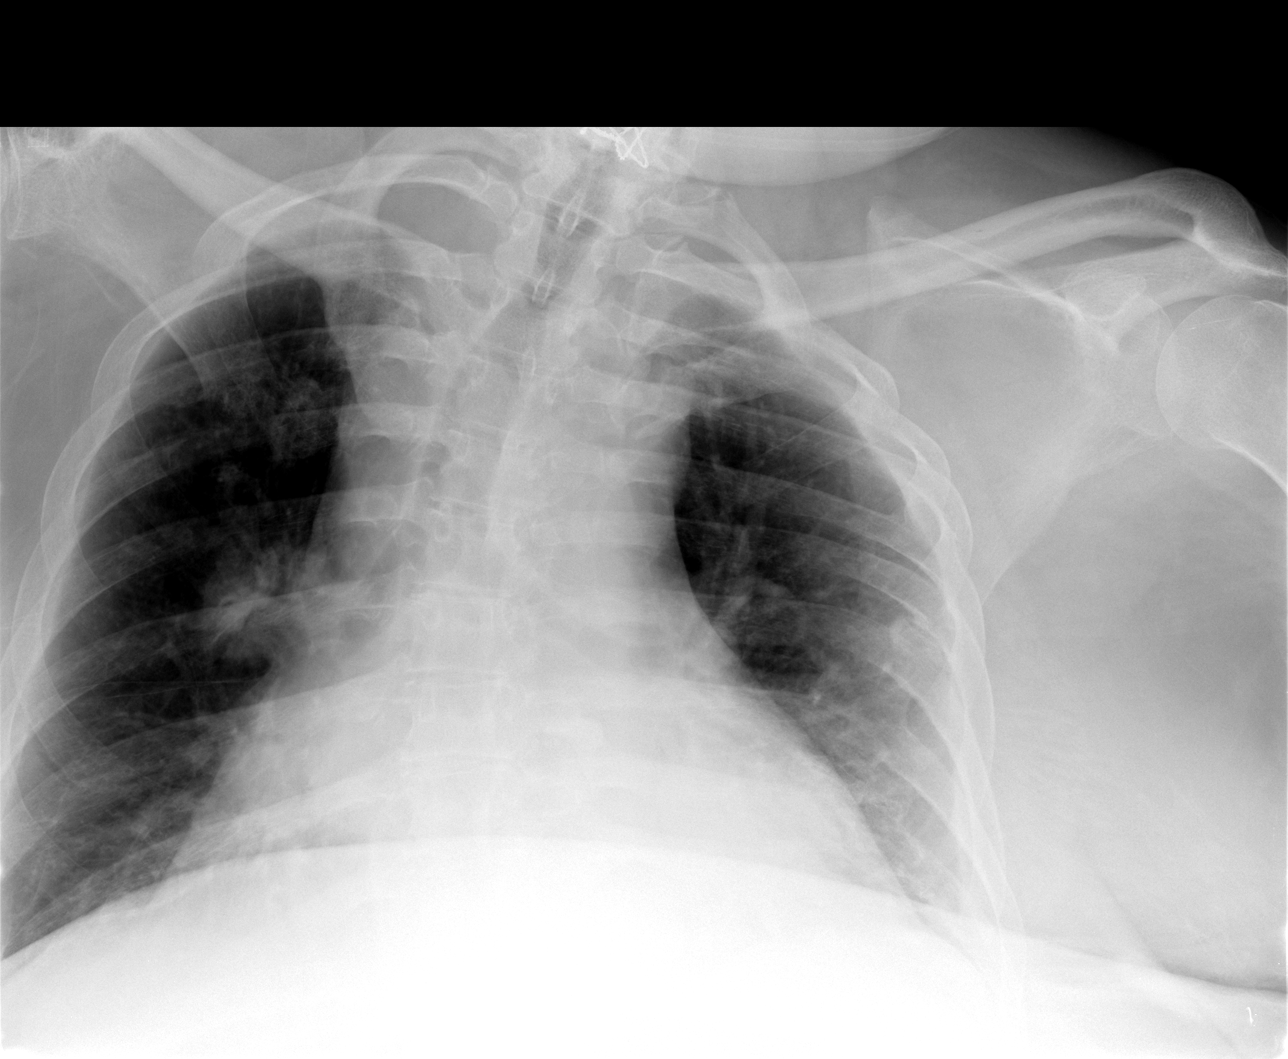

[view not recorded (2 of 2)]
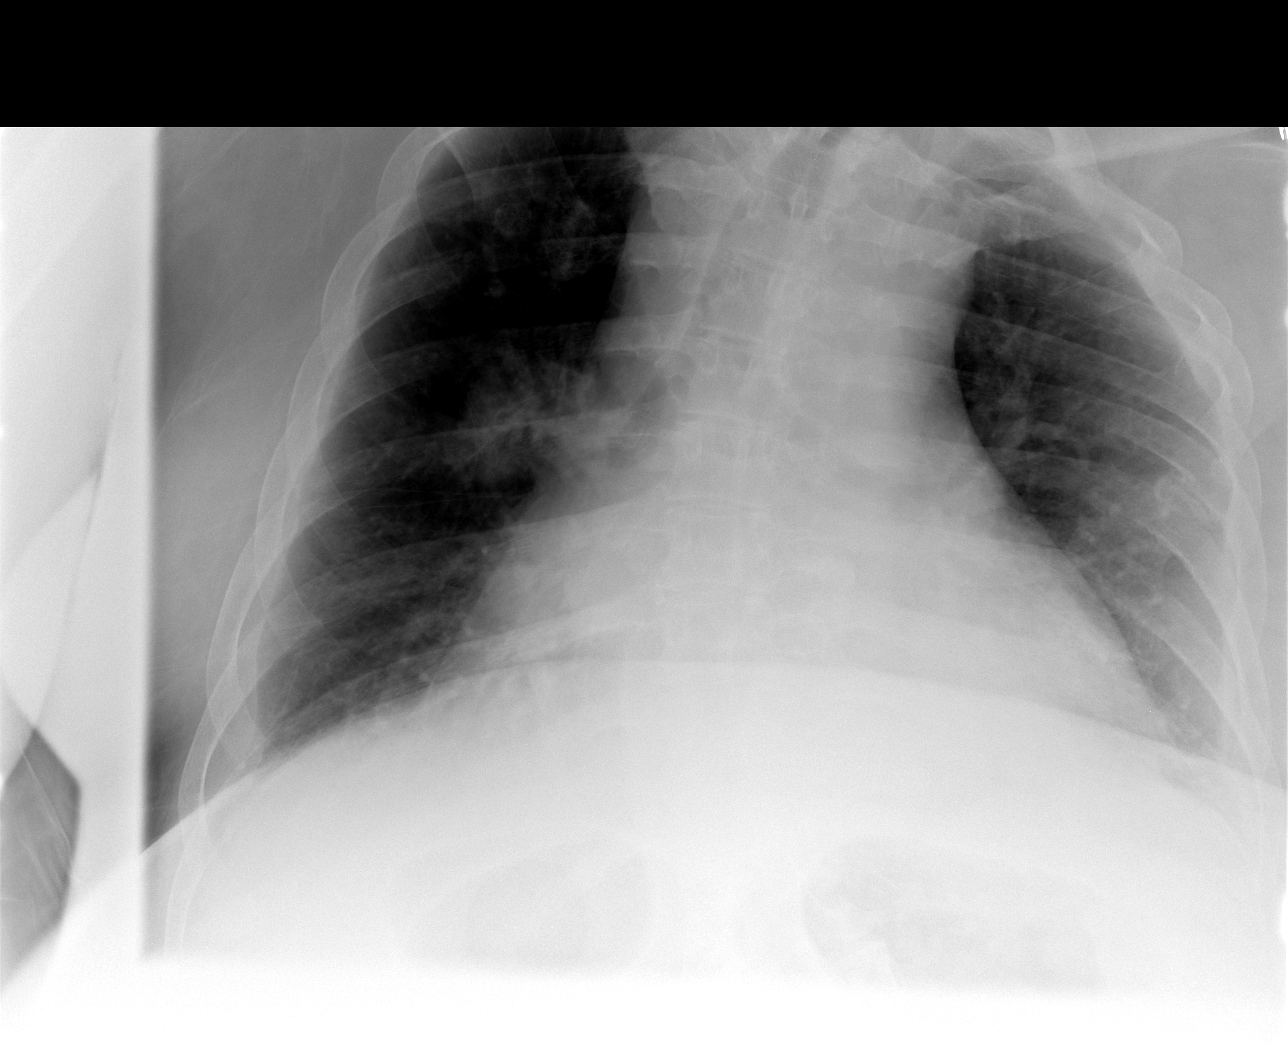

[3 of 3 positions shown; findings below may reference images not displayed]

FINDINGS: The heart is enlarged.  There is pulmonary vascular
congestion but no evidence for overt edema.  Study is limited by a
limited patient mobility.  There is prominence of right hilar
density, raising question of adenopathy or mass.  Further
evaluation with CT of the chest may be helpful.  There is a density
in the left midlung zone, likely related to the ribs.  The lateral
view is nondiagnostic as the patient's arms are at his sides.
IMPRESSION: 1.  Cardiomegaly and vascular congestion.
2.  Question of right hilar mass.  Further evaluation with chest CT
with contrast is recommended.

## 2013-06-10 ENCOUNTER — Encounter (HOSPITAL_BASED_OUTPATIENT_CLINIC_OR_DEPARTMENT_OTHER): Payer: Medicare Other

## 2013-06-11 IMAGING — US IR FLUORO GUIDE CV LINE*R*
1 series · 2 of 2 positions shown · non-contrast
Comparison: none

CLINICAL DATA: Pneumonia, decubitus ulcer, poor venous access;
central venous access is requested for fluids/ medications.

[Series 1: ir fluoro guide cv line*right* · 2 of 2 slices shown]
[im 1/2]
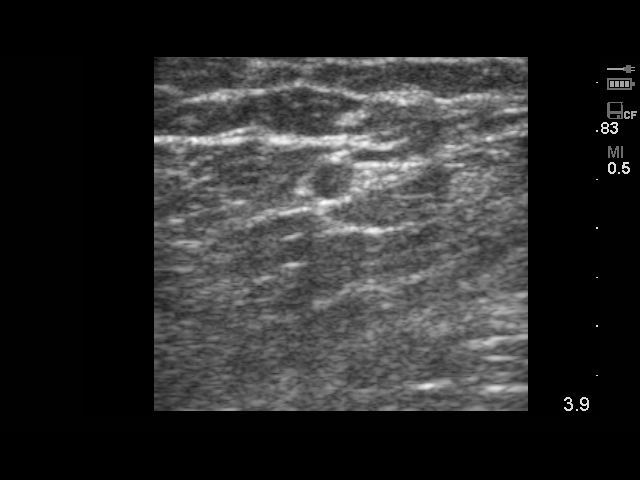
[im 2/2]
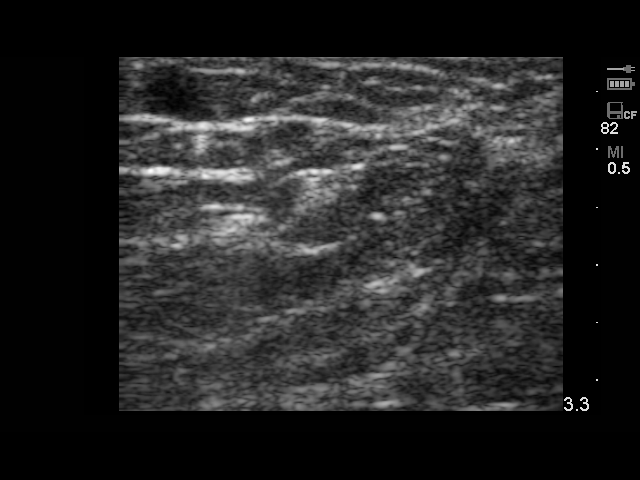

[2 of 2 positions shown; findings below may reference images not displayed]

PICC LINE PLACEMENT WITH ULTRASOUND AND FLUOROSCOPIC  GUIDANCE

Fluoroscopy Time: 1.8 minutes.

The right arm was prepped with chlorhexidine, draped in the usual
sterile fashion using maximum barrier technique (cap and mask,
sterile gown, sterile gloves, large sterile sheet, hand hygiene and
cutaneous antisepsis) and infiltrated locally with 1% Lidocaine.

Ultrasound demonstrated patency of the right basilic vein, and this
was documented with an image.  Under real-time ultrasound guidance,
this vein was accessed with a 21 gauge micropuncture needle and
image documentation was performed.  The needle was exchanged over a
guidewire for a peel-away sheath through which a five French double
lumen PICC trimmed to 51 cm was advanced, positioned with its tip
at the lower SVC/right atrial junction.  Fluoroscopy during the
procedure and fluoro spot radiograph confirms appropriate catheter
position.  The catheter was flushed, secured to the skin with
Prolene sutures, and covered with a sterile dressing.

Complications:  None
IMPRESSION: Successful right arm PICC line placement with ultrasound and
fluoroscopic guidance.  The catheter is ready for use.

Read by: Jepsen, Isela.-TIGER

## 2013-06-14 IMAGING — CR DG CHEST 1V PORT
1 series · 1 of 1 positions shown · non-contrast
Comparison: 10/31/2010

CLINICAL DATA: , shortness of breath

PORTABLE CHEST - 1 VIEW

[AP]
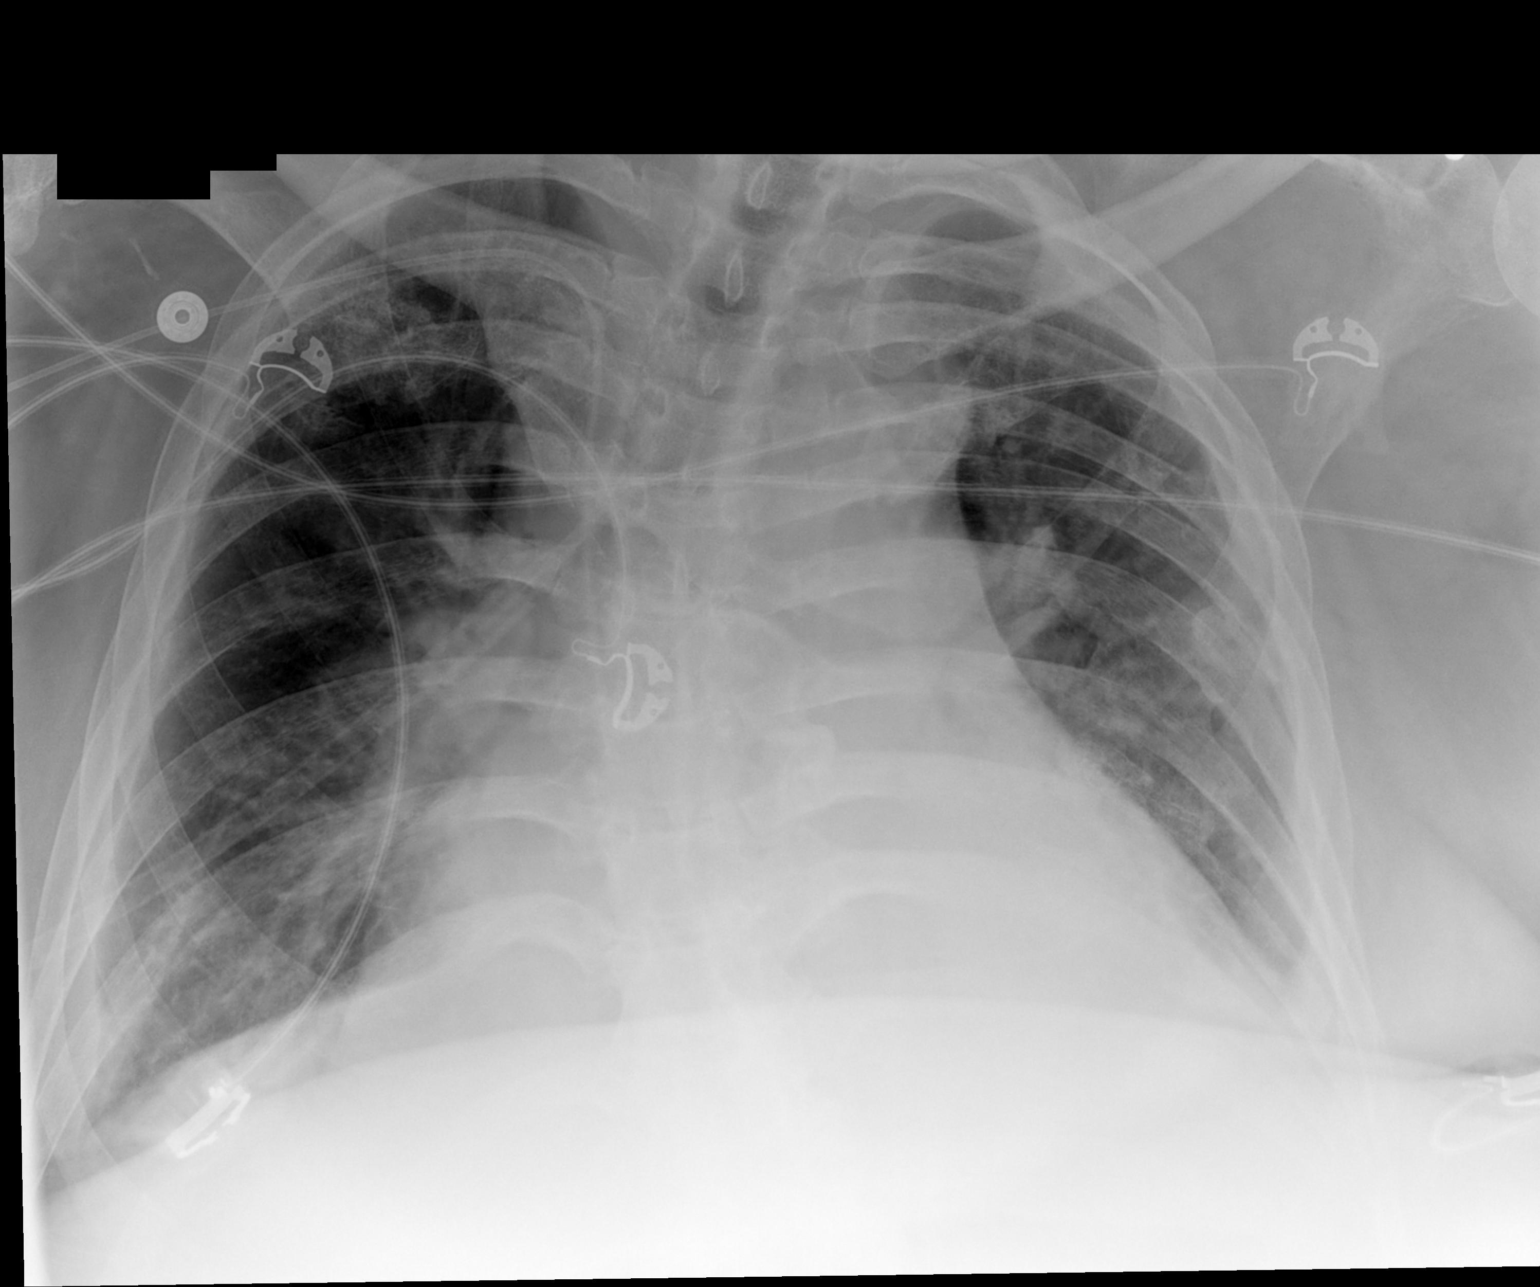

[1 of 1 positions shown; findings below may reference images not displayed]

FINDINGS: Right arm PICC line tip is in the cavoatrial junction.

Stable cardiac enlargement.

Pulmonary edema pattern is unchanged from prior exam.

There are small bilateral pleural effusions.
IMPRESSION: 1.  CHF, unchanged from previous exam.

## 2013-06-14 IMAGING — CR DG CHEST 1V PORT
1 series · 1 of 1 positions shown · non-contrast
Comparison: 10/29/2010

CLINICAL DATA: Respiratory distress

PORTABLE CHEST - 1 VIEW

[AP]
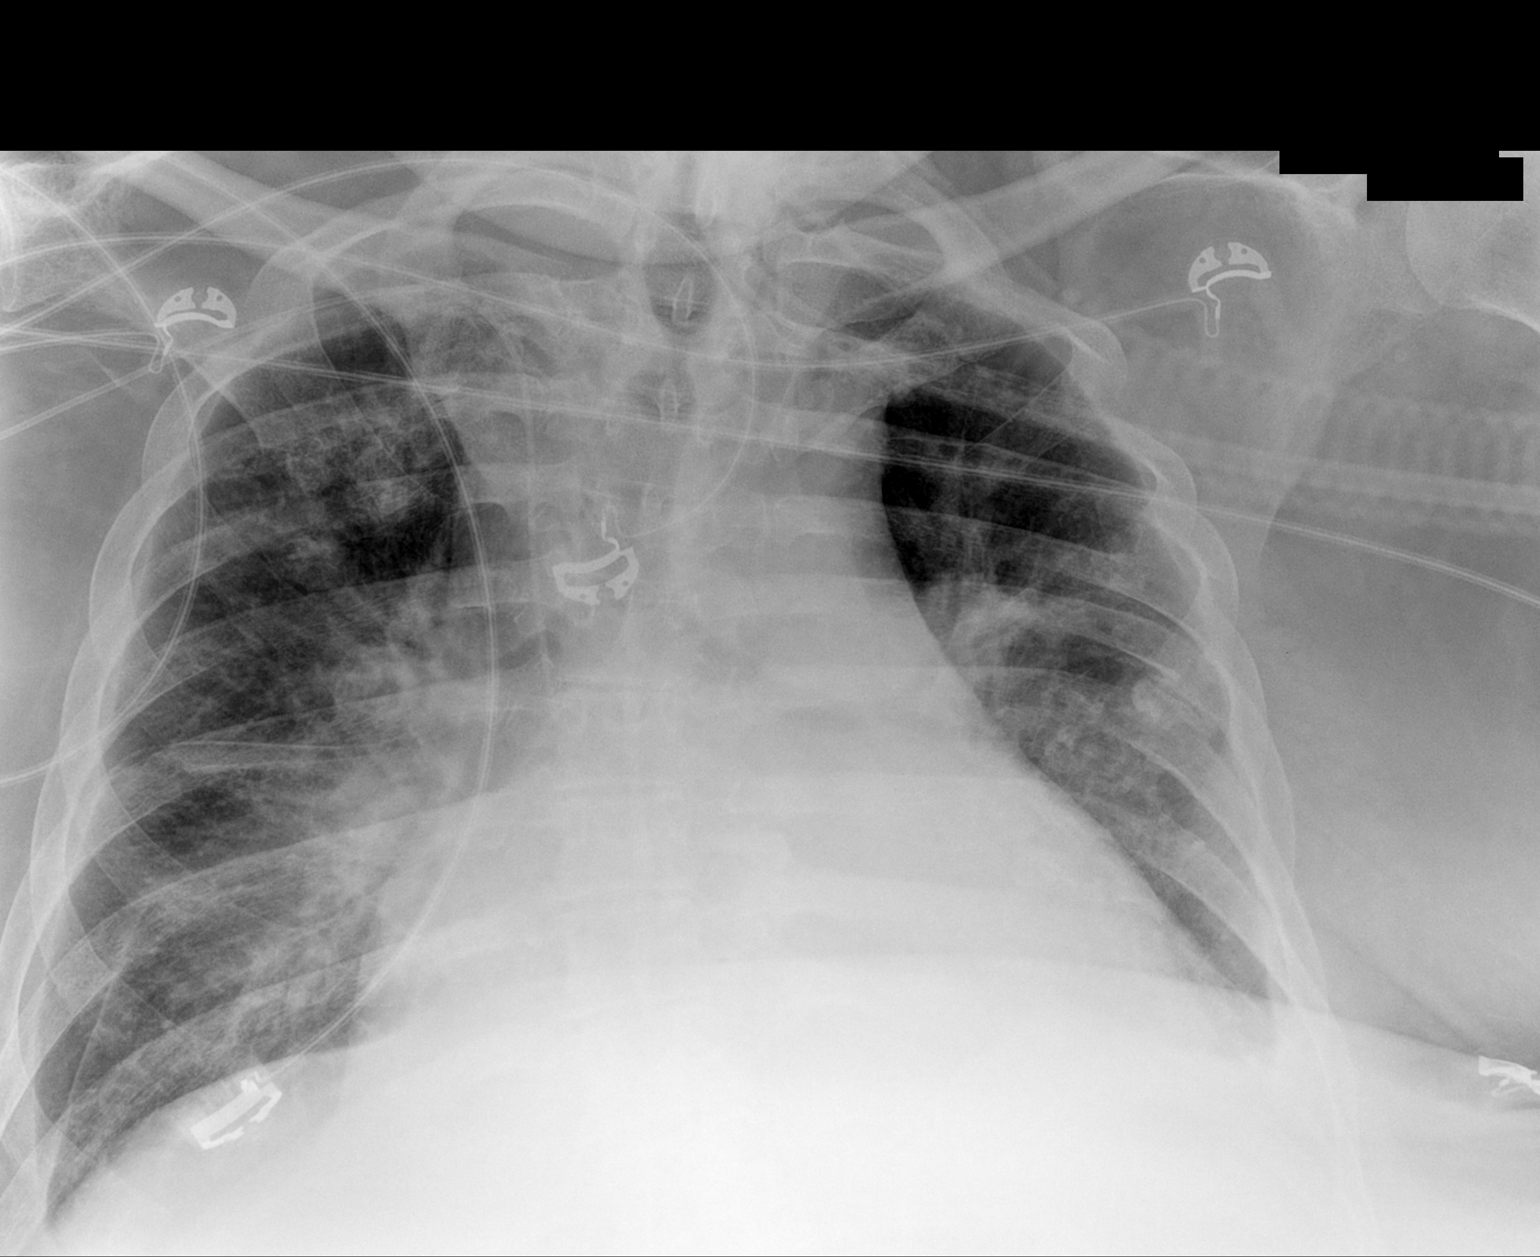

[1 of 1 positions shown; findings below may reference images not displayed]

FINDINGS: PICC tip remains in the SVC.  Improved aeration in the
lungs bilaterally with decrease in edema.  Bibasilar atelectasis
remains.  Small pleural effusions bilaterally.
IMPRESSION: Improvement in pulmonary edema.

## 2013-06-19 IMAGING — CR DG CHEST 2V
3 series · 3 of 3 positions shown · non-contrast
Comparison: [DATE] and 10/26/2010

CLINICAL DATA: Shortness of breath and chest congestion for 1
month.

CHEST - 2 VIEW

[w chest lat]
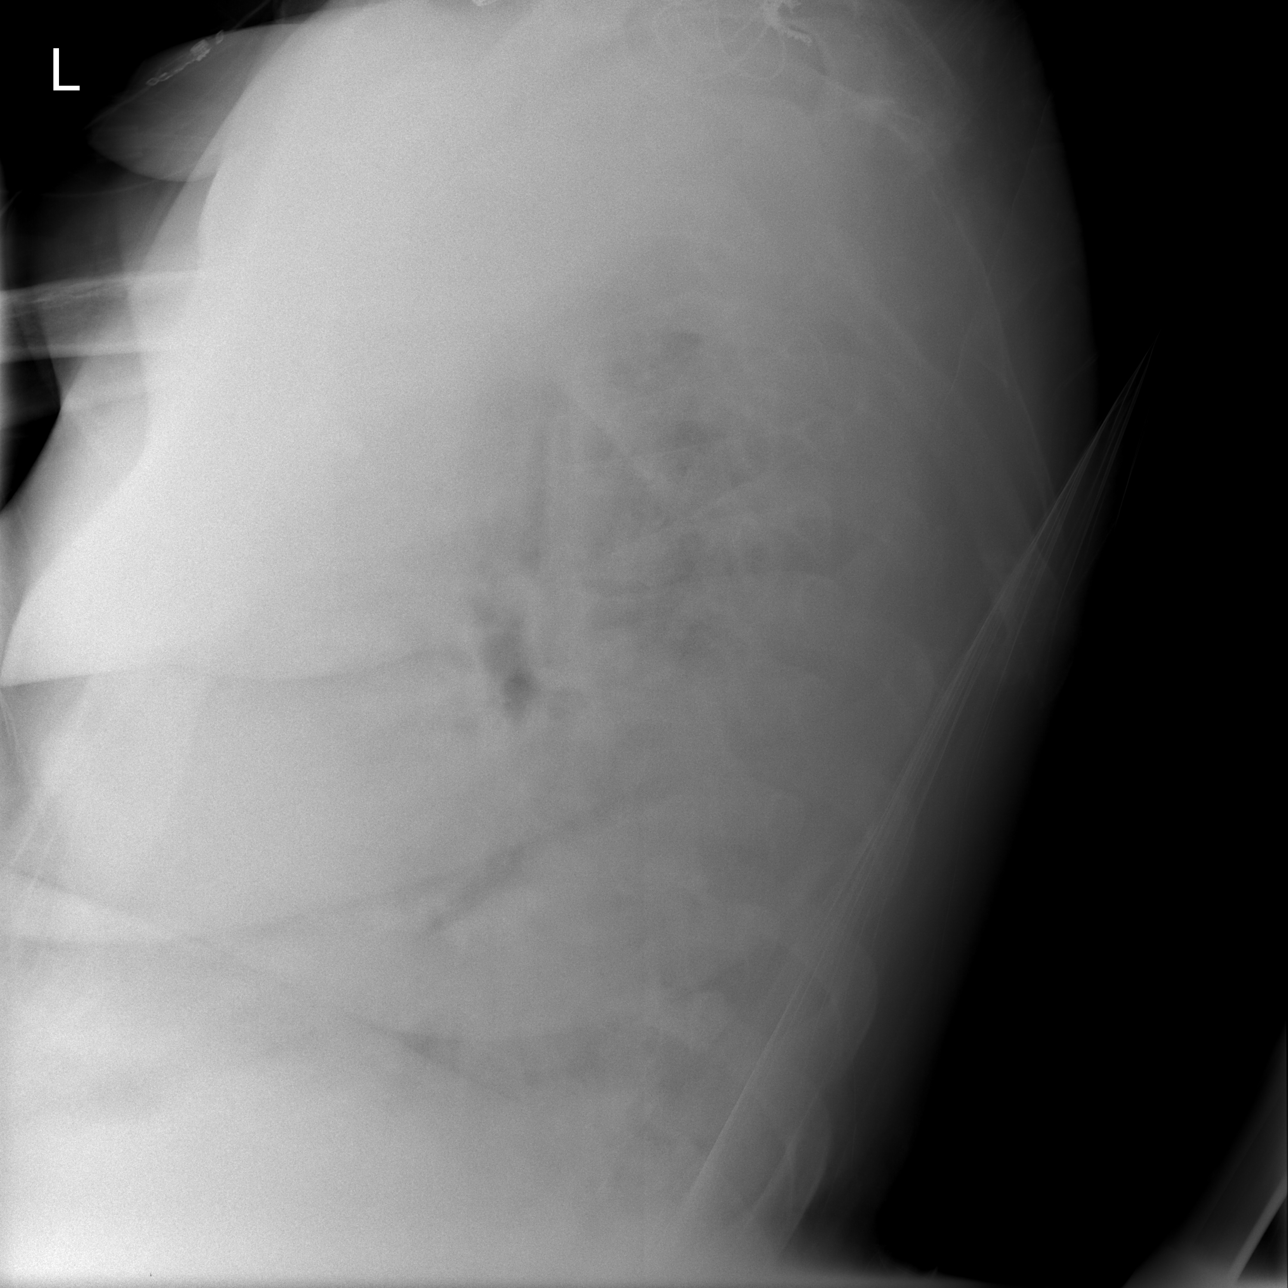

[view not recorded (1 of 2)]
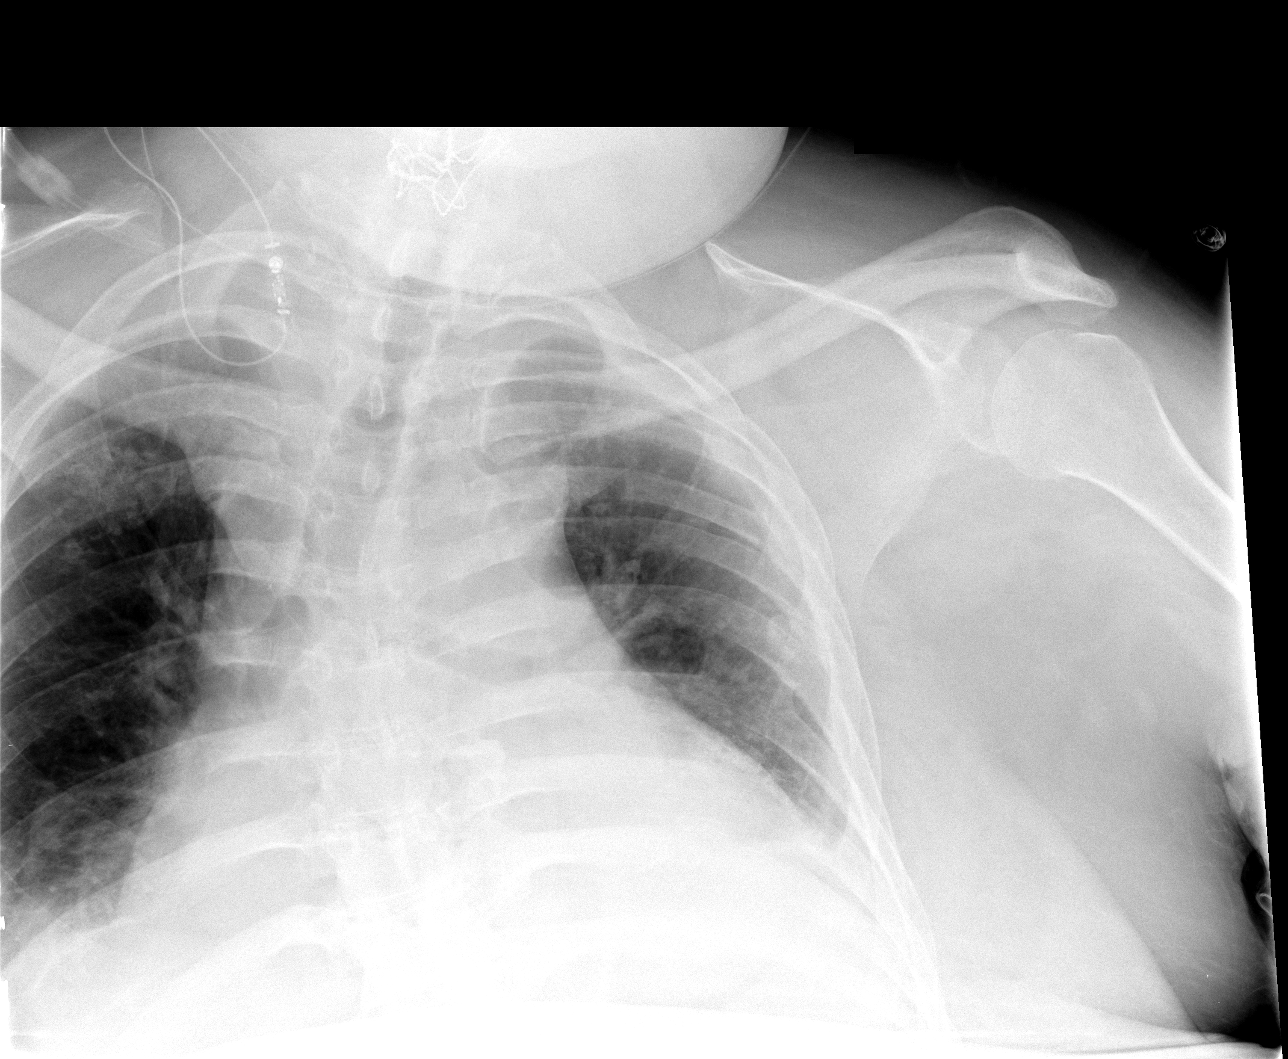

[view not recorded (2 of 2)]
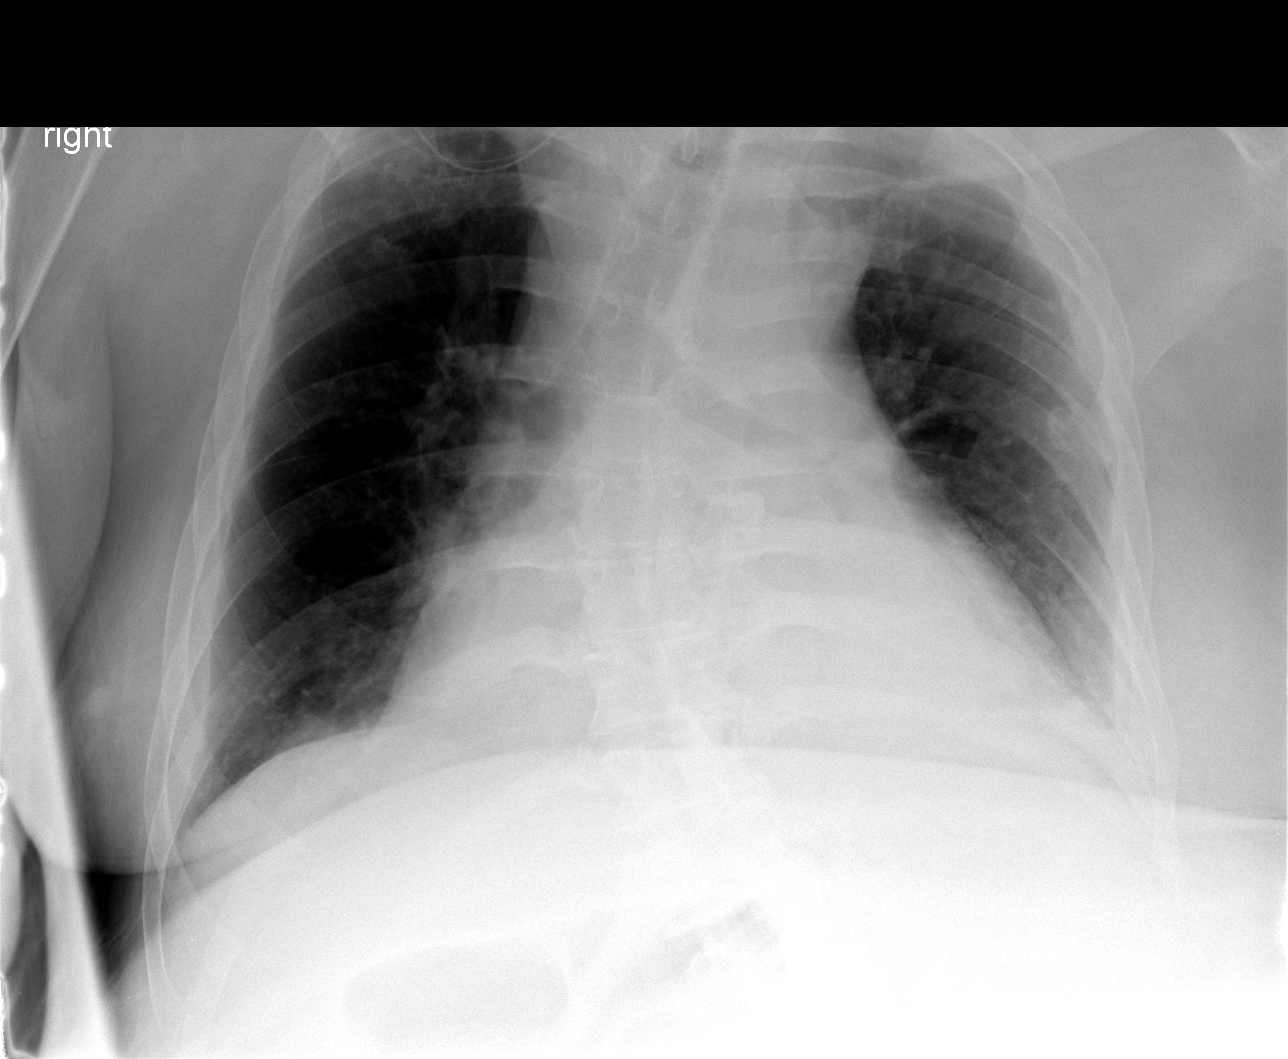

[3 of 3 positions shown; findings below may reference images not displayed]

FINDINGS: There is persistent bibasilar consolidation and
atelectasis with a small left pleural effusion.  The appearance is
unchanged.  There is chronic cardiomegaly.  Vascularity is normal.
IMPRESSION: Persistent bibasilar atelectasis and small left effusion.

## 2013-06-21 ENCOUNTER — Inpatient Hospital Stay (HOSPITAL_COMMUNITY)
Admission: EM | Admit: 2013-06-21 | Discharge: 2013-07-01 | DRG: 871 | Disposition: A | Discharge status: Home Health | Payer: Medicare Other | Attending: Internal Medicine | Admitting: Internal Medicine

## 2013-06-21 ENCOUNTER — Encounter (HOSPITAL_COMMUNITY): Payer: Self-pay | Admitting: Emergency Medicine

## 2013-06-21 ENCOUNTER — Emergency Department (HOSPITAL_COMMUNITY): Payer: Medicare Other

## 2013-06-21 DIAGNOSIS — A4159 Other Gram-negative sepsis: Principal | ICD-10-CM | POA: Diagnosis present

## 2013-06-21 DIAGNOSIS — J45909 Unspecified asthma, uncomplicated: Secondary | ICD-10-CM | POA: Diagnosis present

## 2013-06-21 DIAGNOSIS — G8250 Quadriplegia, unspecified: Secondary | ICD-10-CM | POA: Diagnosis present

## 2013-06-21 DIAGNOSIS — Z87440 Personal history of urinary (tract) infections: Secondary | ICD-10-CM

## 2013-06-21 DIAGNOSIS — D72829 Elevated white blood cell count, unspecified: Secondary | ICD-10-CM

## 2013-06-21 DIAGNOSIS — E875 Hyperkalemia: Secondary | ICD-10-CM | POA: Diagnosis present

## 2013-06-21 DIAGNOSIS — E669 Obesity, unspecified: Secondary | ICD-10-CM

## 2013-06-21 DIAGNOSIS — E662 Morbid (severe) obesity with alveolar hypoventilation: Secondary | ICD-10-CM | POA: Diagnosis present

## 2013-06-21 DIAGNOSIS — Z888 Allergy status to other drugs, medicaments and biological substances status: Secondary | ICD-10-CM

## 2013-06-21 DIAGNOSIS — Z8711 Personal history of peptic ulcer disease: Secondary | ICD-10-CM

## 2013-06-21 DIAGNOSIS — G40909 Epilepsy, unspecified, not intractable, without status epilepticus: Secondary | ICD-10-CM | POA: Diagnosis present

## 2013-06-21 DIAGNOSIS — L89109 Pressure ulcer of unspecified part of back, unspecified stage: Secondary | ICD-10-CM | POA: Diagnosis present

## 2013-06-21 DIAGNOSIS — G4733 Obstructive sleep apnea (adult) (pediatric): Secondary | ICD-10-CM

## 2013-06-21 DIAGNOSIS — T83511A Infection and inflammatory reaction due to indwelling urethral catheter, initial encounter: Secondary | ICD-10-CM | POA: Diagnosis present

## 2013-06-21 DIAGNOSIS — R000 Tachycardia, unspecified: Secondary | ICD-10-CM | POA: Diagnosis present

## 2013-06-21 DIAGNOSIS — A498 Other bacterial infections of unspecified site: Secondary | ICD-10-CM

## 2013-06-21 DIAGNOSIS — A419 Sepsis, unspecified organism: Secondary | ICD-10-CM

## 2013-06-21 DIAGNOSIS — N390 Urinary tract infection, site not specified: Secondary | ICD-10-CM

## 2013-06-21 DIAGNOSIS — J9610 Chronic respiratory failure, unspecified whether with hypoxia or hypercapnia: Secondary | ICD-10-CM | POA: Diagnosis present

## 2013-06-21 DIAGNOSIS — Y846 Urinary catheterization as the cause of abnormal reaction of the patient, or of later complication, without mention of misadventure at the time of the procedure: Secondary | ICD-10-CM | POA: Diagnosis present

## 2013-06-21 DIAGNOSIS — IMO0002 Reserved for concepts with insufficient information to code with codable children: Secondary | ICD-10-CM

## 2013-06-21 DIAGNOSIS — L89154 Pressure ulcer of sacral region, stage 4: Secondary | ICD-10-CM | POA: Diagnosis present

## 2013-06-21 DIAGNOSIS — D649 Anemia, unspecified: Secondary | ICD-10-CM

## 2013-06-21 DIAGNOSIS — Z79899 Other long term (current) drug therapy: Secondary | ICD-10-CM

## 2013-06-21 DIAGNOSIS — M86659 Other chronic osteomyelitis, unspecified thigh: Secondary | ICD-10-CM

## 2013-06-21 DIAGNOSIS — I10 Essential (primary) hypertension: Secondary | ICD-10-CM

## 2013-06-21 DIAGNOSIS — L8994 Pressure ulcer of unspecified site, stage 4: Secondary | ICD-10-CM | POA: Diagnosis present

## 2013-06-21 DIAGNOSIS — Z933 Colostomy status: Secondary | ICD-10-CM

## 2013-06-21 DIAGNOSIS — Z9359 Other cystostomy status: Secondary | ICD-10-CM

## 2013-06-21 DIAGNOSIS — Z6830 Body mass index (BMI) 30.0-30.9, adult: Secondary | ICD-10-CM

## 2013-06-21 DIAGNOSIS — D638 Anemia in other chronic diseases classified elsewhere: Secondary | ICD-10-CM | POA: Diagnosis present

## 2013-06-21 DIAGNOSIS — Z8701 Personal history of pneumonia (recurrent): Secondary | ICD-10-CM

## 2013-06-21 LAB — CBC WITH DIFFERENTIAL/PLATELET
BASOS ABS: 0 10*3/uL (ref 0.0–0.1)
BASOS PCT: 0 % (ref 0–1)
EOS ABS: 0.1 10*3/uL (ref 0.0–0.7)
EOS PCT: 1 % (ref 0–5)
HCT: 29.3 % — ABNORMAL LOW (ref 39.0–52.0)
Hemoglobin: 9.2 g/dL — ABNORMAL LOW (ref 13.0–17.0)
Lymphocytes Relative: 4 % — ABNORMAL LOW (ref 12–46)
Lymphs Abs: 0.7 10*3/uL (ref 0.7–4.0)
MCH: 24 pg — AB (ref 26.0–34.0)
MCHC: 31.4 g/dL (ref 30.0–36.0)
MCV: 76.5 fL — AB (ref 78.0–100.0)
Monocytes Absolute: 0.9 10*3/uL (ref 0.1–1.0)
Monocytes Relative: 6 % (ref 3–12)
NEUTROS PCT: 90 % — AB (ref 43–77)
Neutro Abs: 13.9 10*3/uL — ABNORMAL HIGH (ref 1.7–7.7)
PLATELETS: 374 10*3/uL (ref 150–400)
RBC: 3.83 MIL/uL — ABNORMAL LOW (ref 4.22–5.81)
RDW: 15.8 % — AB (ref 11.5–15.5)
WBC: 15.5 10*3/uL — ABNORMAL HIGH (ref 4.0–10.5)

## 2013-06-21 LAB — COMPREHENSIVE METABOLIC PANEL
ALBUMIN: 2.7 g/dL — AB (ref 3.5–5.2)
ALT: 17 U/L (ref 0–53)
AST: 16 U/L (ref 0–37)
Alkaline Phosphatase: 105 U/L (ref 39–117)
BUN: 17 mg/dL (ref 6–23)
CO2: 23 mEq/L (ref 19–32)
Calcium: 8.9 mg/dL (ref 8.4–10.5)
Chloride: 99 mEq/L (ref 96–112)
Creatinine, Ser: 0.46 mg/dL — ABNORMAL LOW (ref 0.50–1.35)
GFR calc non Af Amer: >90 mL/min (ref >90)
Glucose, Bld: 122 mg/dL — ABNORMAL HIGH (ref 70–99)
POTASSIUM: 4.5 meq/L (ref 3.7–5.3)
SODIUM: 133 meq/L — AB (ref 137–147)
TOTAL PROTEIN: 8.1 g/dL (ref 6.0–8.3)
Total Bilirubin: <0.2 mg/dL — ABNORMAL LOW (ref 0.3–1.2)

## 2013-06-21 LAB — URINE MICROSCOPIC-ADD ON

## 2013-06-21 LAB — URINALYSIS, ROUTINE W REFLEX MICROSCOPIC
BILIRUBIN URINE: NEGATIVE
Glucose, UA: NEGATIVE mg/dL
Ketones, ur: NEGATIVE mg/dL
Nitrite: POSITIVE — AB
PH: 5.5 (ref 5.0–8.0)
Protein, ur: 30 mg/dL — AB
Specific Gravity, Urine: 1.019 (ref 1.005–1.030)
Urobilinogen, UA: 0.2 mg/dL (ref 0.0–1.0)

## 2013-06-21 LAB — CG4 I-STAT (LACTIC ACID): Lactic Acid, Venous: 0.94 mmol/L (ref 0.5–2.2)

## 2013-06-21 MED ORDER — POTASSIUM CHLORIDE CRYS ER 20 MEQ PO TBCR
20.0000 meq | EXTENDED_RELEASE_TABLET | Freq: Two times a day (BID) | ORAL | Status: DC
  Administered 2013-06-22: 20 meq via ORAL
  Filled 2013-06-21 (×3): qty 1

## 2013-06-21 MED ORDER — DOCUSATE SODIUM 100 MG PO CAPS
100.0000 mg | ORAL_CAPSULE | Freq: Two times a day (BID) | ORAL | Status: DC
  Administered 2013-06-22 – 2013-07-01 (×20): 100 mg via ORAL
  Filled 2013-06-21 (×21): qty 1

## 2013-06-21 MED ORDER — ACETAMINOPHEN 325 MG PO TABS
650.0000 mg | ORAL_TABLET | Freq: Four times a day (QID) | ORAL | Status: DC | PRN
  Administered 2013-06-22 – 2013-06-23 (×2): 650 mg via ORAL
  Filled 2013-06-21 (×2): qty 2

## 2013-06-21 MED ORDER — ADULT MULTIVITAMIN W/MINERALS CH
1.0000 | ORAL_TABLET | Freq: Every morning | ORAL | Status: DC
  Administered 2013-06-22 – 2013-07-01 (×10): 1 via ORAL
  Filled 2013-06-21 (×10): qty 1

## 2013-06-21 MED ORDER — HEPARIN SODIUM (PORCINE) 5000 UNIT/ML IJ SOLN
5000.0000 [IU] | Freq: Three times a day (TID) | INTRAMUSCULAR | Status: DC
  Administered 2013-06-22 – 2013-06-27 (×17): 5000 [IU] via SUBCUTANEOUS
  Filled 2013-06-21 (×20): qty 1

## 2013-06-21 MED ORDER — ONDANSETRON HCL 4 MG/2ML IJ SOLN
4.0000 mg | Freq: Three times a day (TID) | INTRAMUSCULAR | Status: AC | PRN
Start: 2013-06-21 — End: 2013-06-22

## 2013-06-21 MED ORDER — SODIUM CHLORIDE 0.9 % IV SOLN
1000.0000 mL | Freq: Once | INTRAVENOUS | Status: AC
  Administered 2013-06-21: 1000 mL via INTRAVENOUS

## 2013-06-21 MED ORDER — VANCOMYCIN HCL IN DEXTROSE 1-5 GM/200ML-% IV SOLN
1000.0000 mg | Freq: Once | INTRAVENOUS | Status: AC
  Administered 2013-06-21: 1000 mg via INTRAVENOUS
  Filled 2013-06-21: qty 200

## 2013-06-21 MED ORDER — AMLODIPINE BESYLATE 5 MG PO TABS
5.0000 mg | ORAL_TABLET | Freq: Every morning | ORAL | Status: DC
  Administered 2013-06-22 – 2013-07-01 (×10): 5 mg via ORAL
  Filled 2013-06-21 (×10): qty 1

## 2013-06-21 MED ORDER — IBUPROFEN 800 MG PO TABS
800.0000 mg | ORAL_TABLET | Freq: Once | ORAL | Status: AC
  Administered 2013-06-22: 800 mg via ORAL
  Filled 2013-06-21 (×2): qty 1

## 2013-06-21 MED ORDER — SODIUM CHLORIDE 0.9 % IV SOLN: 1000.0000 mL | INTRAVENOUS | Status: DC

## 2013-06-21 MED ORDER — BACLOFEN 20 MG PO TABS
20.0000 mg | ORAL_TABLET | Freq: Four times a day (QID) | ORAL | Status: DC
  Administered 2013-06-22 – 2013-07-01 (×39): 20 mg via ORAL
  Filled 2013-06-21 (×41): qty 1

## 2013-06-21 MED ORDER — FUROSEMIDE 20 MG PO TABS
20.0000 mg | ORAL_TABLET | Freq: Two times a day (BID) | ORAL | Status: DC
  Administered 2013-06-22 – 2013-07-01 (×19): 20 mg via ORAL
  Filled 2013-06-21 (×21): qty 1

## 2013-06-21 MED ORDER — DEXTROSE 5 % IV SOLN: 1.0000 g | Freq: Two times a day (BID) | INTRAVENOUS | Status: DC

## 2013-06-21 MED ORDER — SODIUM CHLORIDE 0.9 % IV SOLN
INTRAVENOUS | Status: DC
  Administered 2013-06-22 – 2013-06-30 (×5): via INTRAVENOUS

## 2013-06-21 MED ORDER — FAMOTIDINE 20 MG PO TABS
20.0000 mg | ORAL_TABLET | Freq: Two times a day (BID) | ORAL | Status: DC
  Administered 2013-06-22 – 2013-07-01 (×21): 20 mg via ORAL
  Filled 2013-06-21 (×23): qty 1

## 2013-06-21 MED ORDER — SODIUM CHLORIDE 0.9 % IV SOLN: INTRAVENOUS | Status: AC

## 2013-06-21 MED ORDER — ZINC SULFATE 220 (50 ZN) MG PO CAPS
220.0000 mg | ORAL_CAPSULE | Freq: Every morning | ORAL | Status: DC
Start: 2013-06-22 — End: 2013-07-01
  Administered 2013-06-22 – 2013-07-01 (×10): 220 mg via ORAL
  Filled 2013-06-21 (×11): qty 1

## 2013-06-21 MED ORDER — PIPERACILLIN-TAZOBACTAM 3.375 G IVPB
3.3750 g | Freq: Once | INTRAVENOUS | Status: AC
  Administered 2013-06-21: 3.375 g via INTRAVENOUS
  Filled 2013-06-21: qty 50

## 2013-06-21 MED ORDER — METOCLOPRAMIDE HCL 10 MG PO TABS
10.0000 mg | ORAL_TABLET | Freq: Three times a day (TID) | ORAL | Status: DC
  Administered 2013-06-22 – 2013-07-01 (×29): 10 mg via ORAL
  Filled 2013-06-21 (×34): qty 1

## 2013-06-21 NOTE — ED Notes (Signed)
Pt c/o fever since this am. Pt has no other complaints. Pt sts fever continues to increase.

## 2013-06-21 NOTE — ED Notes (Signed)
Bed: WA24 Expected date:  Expected time:  Means of arrival:  Comments: EMS-fever 
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
## 2013-06-21 NOTE — ED Notes (Signed)
Pt c/o Fever all day. 102.1 with EMS, pt has Suprapubic bag, and colostomy. Denies N/V, diarrhea, cough. A&Ox4.
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
## 2013-06-21 NOTE — ED Notes (Signed)
Patient transported to X-ray 

## 2013-06-22 DIAGNOSIS — G4733 Obstructive sleep apnea (adult) (pediatric): Secondary | ICD-10-CM

## 2013-06-22 DIAGNOSIS — E669 Obesity, unspecified: Secondary | ICD-10-CM

## 2013-06-22 DIAGNOSIS — L8990 Pressure ulcer of unspecified site, unspecified stage: Secondary | ICD-10-CM

## 2013-06-22 DIAGNOSIS — I10 Essential (primary) hypertension: Secondary | ICD-10-CM

## 2013-06-22 DIAGNOSIS — M869 Osteomyelitis, unspecified: Secondary | ICD-10-CM

## 2013-06-22 DIAGNOSIS — L89109 Pressure ulcer of unspecified part of back, unspecified stage: Secondary | ICD-10-CM

## 2013-06-22 DIAGNOSIS — A419 Sepsis, unspecified organism: Secondary | ICD-10-CM

## 2013-06-22 LAB — CBC
HCT: 28.5 % — ABNORMAL LOW (ref 39.0–52.0)
Hemoglobin: 9 g/dL — ABNORMAL LOW (ref 13.0–17.0)
MCH: 24.2 pg — AB (ref 26.0–34.0)
MCHC: 31.6 g/dL (ref 30.0–36.0)
MCV: 76.6 fL — AB (ref 78.0–100.0)
PLATELETS: 335 10*3/uL (ref 150–400)
RBC: 3.72 MIL/uL — AB (ref 4.22–5.81)
RDW: 15.8 % — ABNORMAL HIGH (ref 11.5–15.5)
WBC: 18.4 10*3/uL — ABNORMAL HIGH (ref 4.0–10.5)

## 2013-06-22 LAB — BASIC METABOLIC PANEL
BUN: 16 mg/dL (ref 6–23)
CALCIUM: 8.4 mg/dL (ref 8.4–10.5)
CO2: 20 mEq/L (ref 19–32)
CREATININE: 0.47 mg/dL — AB (ref 0.50–1.35)
Chloride: 101 mEq/L (ref 96–112)
GFR calc Af Amer: >90 mL/min (ref >90)
Glucose, Bld: 108 mg/dL — ABNORMAL HIGH (ref 70–99)
Potassium: 6 mEq/L — ABNORMAL HIGH (ref 3.7–5.3)
Sodium: 133 mEq/L — ABNORMAL LOW (ref 137–147)

## 2013-06-22 MED ORDER — VANCOMYCIN HCL IN DEXTROSE 1-5 GM/200ML-% IV SOLN
1000.0000 mg | Freq: Two times a day (BID) | INTRAVENOUS | Status: DC
  Administered 2013-06-22 – 2013-06-24 (×6): 1000 mg via INTRAVENOUS
  Filled 2013-06-22 (×6): qty 200

## 2013-06-22 MED ORDER — PIPERACILLIN-TAZOBACTAM 3.375 G IVPB
3.3750 g | Freq: Three times a day (TID) | INTRAVENOUS | Status: DC
  Administered 2013-06-22 (×2): 3.375 g via INTRAVENOUS
  Filled 2013-06-22 (×4): qty 50

## 2013-06-22 MED ORDER — SODIUM CHLORIDE 0.9 % IV SOLN
500.0000 mg | Freq: Four times a day (QID) | INTRAVENOUS | Status: DC
  Administered 2013-06-22 – 2013-06-24 (×8): 500 mg via INTRAVENOUS
  Filled 2013-06-22 (×10): qty 500
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
## 2013-06-22 NOTE — ED Provider Notes (Signed)
Medical screening examination/treatment/procedure(s) were performed by non-physician practitioner and as supervising physician I was immediately available for consultation/collaboration.      Blanchard Kelch, MD 06/22/13 1113

## 2013-06-22 NOTE — Progress Notes (Signed)
Placed pt on CPAP 19cmH2O per home settings with no oxygen bled in. Sterile water was added for humidification. Pt looks comfortable and is tolerating CPAP well at this time. RT will continue to monitor as needed.

## 2013-06-22 NOTE — Progress Notes (Signed)
[facility]  Patient Status: Active (receiving services up to time of hospitalization)  [facility] is providing the following services: RN  If patient discharges after hours, please call [phone].   Lurlean Leyden 06/22/2013, 10:49 AM

## 2013-06-22 NOTE — Progress Notes (Signed)
Pt arrived to floor room 1509 via stretcher. Alert and oriented x4 VS taken, PT oriented to room no complaints. Initial assessment completed. Will continue to monitor throughout shift

## 2013-06-22 NOTE — Progress Notes (Signed)
[facility] PROGRESS NOTE  [name] [name] PIR:[id number] DOB: [birth year] DOA: 06/21/2013 PCP: [name], MD  Assessment/Plan: 1. Complicated UTI with suprapubic cath and sepsis 1. Febrile with tachycardia, rising leukocytosis 2. On empiric vanc/zosyn 3. Urine culture pending 2. Chronic osteomyelitis 1. Vanc was empirically started in the ED 2. Clinic notes reviewed. Pt completed abx for osteo in 11/14 and has not been on abx since - confirmed by pt 3. Dressed with packing material - serosanguinous fluid noted, no frank purulence 3. DVT prophylaxis 1. Heparin subQ 4. Hyperkalemia 1. Hold potassium 2. Cont to monitor 5. HTN 1. Stable and controlled 2. Cont current regimen  Code Status: Full Family Communication: Pt in room (indicate person spoken with, relationship, and if by phone, the number) Disposition Plan: Pending   Consultants:    Procedures:    Antibiotics:  Vanc 06/21/13>>>  Zosyn 06/21/13>>>  HPI/Subjective: Reports feeling slightly better. No acute events overnight  Objective: Filed Vitals:   06/21/13 2200 06/21/13 2215 06/21/13 2230 06/22/13 0510  BP: 117/44 103/45 102/64 119/70  Pulse:    107  Temp:   98.9 F (37.2 C) 100.7 F (38.2 C)  TempSrc:   Oral Oral  Resp:    20  Height:   6' (1.829 m)   Weight:   102.1 kg (225 lb 1.4 oz)   SpO2:    97%    Intake/Output Summary (Last 24 hours) at 06/22/13 1332 Last data filed at 06/22/13 1300  Gross per 24 hour  Intake   1260 ml  Output    555 ml  Net    705 ml   Filed Weights   06/21/13 2230  Weight: 102.1 kg (225 lb 1.4 oz)    Exam:   General:  Awake, in nad  Cardiovascular: regular, s1, s2  Respiratory: normal resp effort, no wheezing  Abdomen: soft, nondistended  Musculoskeletal: perfused, contractures over UE B   Data Reviewed: Basic Metabolic Panel:  Recent Labs Lab 06/21/13 2010 06/22/13 0535  NA 133* 133*  K 4.5 6.0*  CL 99 101  CO2 23 20  GLUCOSE 122* 108*   BUN 17 16  CREATININE 0.46* 0.47*  CALCIUM 8.9 8.4   Liver Function Tests:  Recent Labs Lab 06/21/13 2010  AST 16  ALT 17  ALKPHOS 105  BILITOT <0.2*  PROT 8.1  ALBUMIN 2.7*   No results found for this basename: LIPASE, AMYLASE,  in the last 168 hours No results found for this basename: AMMONIA,  in the last 168 hours CBC:  Recent Labs Lab 06/21/13 2010 06/22/13 0656  WBC 15.5* 18.4*  NEUTROABS 13.9*  --   HGB 9.2* 9.0*  HCT 29.3* 28.5*  MCV 76.5* 76.6*  PLT 374 335   Cardiac Enzymes: No results found for this basename: CKTOTAL, CKMB, CKMBINDEX, TROPONINI,  in the last 168 hours BNP (last 3 results) No results found for this basename: PROBNP,  in the last 8760 hours CBG: No results found for this basename: GLUCAP,  in the last 168 hours  No results found for this or any previous visit (from the past 240 hour(s)).   Studies: Dg Chest 2 View  06/21/2013   CLINICAL DATA:  Fever  EXAM: CHEST  2 VIEW  COMPARISON:  [place] ABSCESS DRAINAGE dated 02/22/2013  FINDINGS: Low lung volumes. Cardiac silhouette is enlarged. There is decreased prominence of the interstitial markings. No focal regions of consolidation identified. The osseous structures demonstrate dextroscoliosis in the thoracolumbar spine.  IMPRESSION: Interstitial findings are  less prominent when compared to previous study. An interstitial infectious or inflammatory infiltrate cannot be excluded. No component of these findings likely represent chronic bronchitic changes.   Electronically Signed   By: Margaree Mackintosh M.D.   On: 06/21/2013 18:46    Scheduled Meds: . sodium chloride   Intravenous STAT  . amLODipine  5 mg Oral q morning - 10a  . baclofen  20 mg Oral QID  . docusate sodium  100 mg Oral BID  . famotidine  20 mg Oral BID  . furosemide  20 mg Oral BID  . heparin  5,000 Units Subcutaneous Q8H  . metoCLOPramide  10 mg Oral TID AC  . multivitamin with minerals  1 tablet Oral q morning - 10a  .  piperacillin-tazobactam (ZOSYN)  IV  3.375 g Intravenous Q8H  . vancomycin  1,000 mg Intravenous Q12H  . zinc sulfate  220 mg Oral q morning - 10a   Continuous Infusions: . sodium chloride    . sodium chloride 75 mL/hr at 06/22/13 0017    Principal Problem:   Complicated UTI (urinary tract infection) Active Problems:   Sacral decubitus ulcer, stage IV   Suprapubic catheter   Chronic osteomyelitis, pelvic region and thigh   UTI (lower urinary tract infection)  Time spent: 57min  [facility], [facility] Pager [phone]. If 7PM-7AM, please contact night-coverage at [email or web address], password [facility] 06/22/2013, 1:32 PM  LOS: 1 day
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
## 2013-06-23 DIAGNOSIS — D72829 Elevated white blood cell count, unspecified: Secondary | ICD-10-CM

## 2013-06-23 DIAGNOSIS — R509 Fever, unspecified: Secondary | ICD-10-CM

## 2013-06-23 LAB — CBC WITH DIFFERENTIAL/PLATELET
BASOS ABS: 0 10*3/uL (ref 0.0–0.1)
Basophils Relative: 0 % (ref 0–1)
EOS ABS: 0.3 10*3/uL (ref 0.0–0.7)
EOS PCT: 2 % (ref 0–5)
HCT: 29.4 % — ABNORMAL LOW (ref 39.0–52.0)
Hemoglobin: 9.3 g/dL — ABNORMAL LOW (ref 13.0–17.0)
LYMPHS PCT: 14 % (ref 12–46)
Lymphs Abs: 2.2 10*3/uL (ref 0.7–4.0)
MCH: 24.2 pg — AB (ref 26.0–34.0)
MCHC: 31.6 g/dL (ref 30.0–36.0)
MCV: 76.6 fL — AB (ref 78.0–100.0)
Monocytes Absolute: 2.2 10*3/uL — ABNORMAL HIGH (ref 0.1–1.0)
Monocytes Relative: 14 % — ABNORMAL HIGH (ref 3–12)
Neutro Abs: 11.5 10*3/uL — ABNORMAL HIGH (ref 1.7–7.7)
Neutrophils Relative %: 71 % (ref 43–77)
PLATELETS: 349 10*3/uL (ref 150–400)
RBC: 3.84 MIL/uL — ABNORMAL LOW (ref 4.22–5.81)
RDW: 16.1 % — ABNORMAL HIGH (ref 11.5–15.5)
WBC: 16.3 10*3/uL — ABNORMAL HIGH (ref 4.0–10.5)

## 2013-06-23 LAB — BASIC METABOLIC PANEL
BUN: 13 mg/dL (ref 6–23)
CALCIUM: 8.6 mg/dL (ref 8.4–10.5)
CO2: 21 meq/L (ref 19–32)
Chloride: 103 mEq/L (ref 96–112)
Creatinine, Ser: 0.49 mg/dL — ABNORMAL LOW (ref 0.50–1.35)
GFR calc Af Amer: >90 mL/min (ref >90)
Glucose, Bld: 108 mg/dL — ABNORMAL HIGH (ref 70–99)
Potassium: 3.9 mEq/L (ref 3.7–5.3)
Sodium: 137 mEq/L (ref 137–147)

## 2013-06-23 MED ORDER — POLYETHYLENE GLYCOL 3350 17 G PO PACK
17.0000 g | PACK | Freq: Every day | ORAL | Status: DC | PRN
  Filled 2013-06-23: qty 1

## 2013-06-23 NOTE — Progress Notes (Signed)
INFECTIOUS DISEASE PROGRESS NOTE  ID: [name] [name] is a 48 y.o. male with  Principal Problem:   Complicated UTI (urinary tract infection) Active Problems:   Sacral decubitus ulcer, stage IV   Suprapubic catheter   Chronic osteomyelitis, pelvic region and thigh   UTI (lower urinary tract infection)  Subjective: Without complaints  Abtx:  Anti-infectives   Start     Dose/Rate Route Frequency Ordered Stop   06/22/13 1800  imipenem-cilastatin (PRIMAXIN) 500 mg in sodium chloride 0.9 % 100 mL IVPB     500 mg 200 mL/hr over 30 Minutes Intravenous 4 times per day 06/22/13 1743     06/22/13 0600  vancomycin (VANCOCIN) IVPB 1000 mg/200 mL premix     1,000 mg 200 mL/hr over 60 Minutes Intravenous Every 12 hours 06/22/13 0308     06/22/13 0600  piperacillin-tazobactam (ZOSYN) IVPB 3.375 g  Status:  Discontinued     3.375 g 12.5 mL/hr over 240 Minutes Intravenous 3 times per day 06/22/13 0308 06/22/13 1743   06/21/13 2200  ceFEPIme (MAXIPIME) 1 g in dextrose 5 % 50 mL IVPB  Status:  Discontinued     1 g 100 mL/hr over 30 Minutes Intravenous Every 12 hours 06/21/13 2158 06/21/13 2204   06/21/13 2100  vancomycin (VANCOCIN) IVPB 1000 mg/200 mL premix     1,000 mg 200 mL/hr over 60 Minutes Intravenous  Once 06/21/13 2022 06/21/13 2230   06/21/13 2045  piperacillin-tazobactam (ZOSYN) IVPB 3.375 g     3.375 g 100 mL/hr over 30 Minutes Intravenous  Once 06/21/13 2022 06/21/13 2128      Medications:  Scheduled: . amLODipine  5 mg Oral q morning - 10a  . baclofen  20 mg Oral QID  . docusate sodium  100 mg Oral BID  . famotidine  20 mg Oral BID  . furosemide  20 mg Oral BID  . heparin  5,000 Units Subcutaneous Q8H  . imipenem-cilastatin  500 mg Intravenous Q6H  . metoCLOPramide  10 mg Oral TID AC  . multivitamin with minerals  1 tablet Oral q morning - 10a  . vancomycin  1,000 mg Intravenous Q12H  . zinc sulfate  220 mg Oral q morning - 10a    Objective: Vital signs in last 24  hours: Temp:  [98.3 F (36.8 C)-103 F (39.4 C)] 98.7 F (37.1 C) (02/08 0657) Pulse Rate:  [63-111] 63 (02/08 0657) Resp:  [20-22] 20 (02/08 0657) BP: (135-137)/(78-84) 137/84 mmHg (02/08 0657) SpO2:  [95 %-100 %] 100 % (02/08 0657)   General appearance: alert, cooperative and no distress Resp: clear to auscultation bilaterally Cardio: regular rate and rhythm GI: normal findings: bowel sounds normal and soft, non-tender  Lab Results  Recent Labs  06/22/13 0535 06/22/13 0656 06/23/13 0543  WBC  --  18.4* 16.3*  HGB  --  9.0* 9.3*  HCT  --  28.5* 29.4*  NA 133*  --  137  K 6.0*  --  3.9  CL 101  --  103  CO2 20  --  21  BUN 16  --  13  CREATININE 0.47*  --  0.49*   Liver Panel  Recent Labs  06/21/13 2010  PROT 8.1  ALBUMIN 2.7*  AST 16  ALT 17  ALKPHOS 105  BILITOT <0.2*   Sedimentation Rate No results found for this basename: ESRSEDRATE,  in the last 72 hours C-Reactive Protein No results found for this basename: CRP,  in the last 72 hours  Microbiology: Recent Results (from the past 240 hour(s))  URINE CULTURE     Status: None   Collection Time    06/21/13  6:40 PM      Result Value Range Status   Specimen Description URINE, CATHETERIZED   Final   Special Requests NONE   Final   Culture  Setup Time     Final   Value: 06/22/2013 00:42     Performed at [facility] PENDING   Incomplete   Culture     Final   Value: Culture reincubated for better growth     Performed at [facility]   Report Status PENDING   Incomplete    Studies/Results: Dg Chest 2 View  06/21/2013   CLINICAL DATA:  Fever  EXAM: CHEST  2 VIEW  COMPARISON:  [place] ABSCESS DRAINAGE dated 02/22/2013  FINDINGS: Low lung volumes. Cardiac silhouette is enlarged. There is decreased prominence of the interstitial markings. No focal regions of consolidation identified. The osseous structures demonstrate dextroscoliosis in the thoracolumbar spine.  IMPRESSION:  Interstitial findings are less prominent when compared to previous study. An interstitial infectious or inflammatory infiltrate cannot be excluded. No component of these findings likely represent chronic bronchitic changes.   Electronically Signed   By: Margaree Mackintosh M.D.   On: 06/21/2013 18:46     Assessment/Plan: Fever  Prev UTI  Complicated decubitus ulcer, chronic, with osteo, previous abscess  Total days of antibiotics: vanco/merrem  UCx, BCx pending.  No change to anbx Temps better today If fever recurs, Cx (-), consider MRI of pelvis to f/u his previous osteo/abscess         [name] Infectious Diseases (pager) [phone] [email or web address] 06/23/2013, 1:06 PM  LOS: 2 days

## 2013-06-23 NOTE — Progress Notes (Signed)
[facility] PROGRESS NOTE  [name] [name] KGM:[id number] DOB: Nov 09, 1965 DOA: 06/21/2013 PCP: [name], MD  Assessment/Plan: 1. Complicated UTI with suprapubic cath and sepsis 1. Febrile with tachycardia, rising leukocytosis 2. On empiric vanc with zosyn changed to Primaxin per ID recs 3. Blood and urine culture pending 2. Chronic osteomyelitis 1. Vanc was empirically started in the ED 2. Clinic notes reviewed. Pt completed abx for osteo in 11/14 3. Consideration for MRI pelvis if cx neg or if fevers persist 3. DVT prophylaxis 1. Heparin subQ 4. Hyperkalemia 1. Hold potassium 2. Cont to monitor 3. Resolved 5. HTN 1. Stable and controlled 2. Cont current regimen  Code Status: Full Family Communication: Pt in room  Disposition Plan: Pending   Consultants:  ID  Procedures:    Antibiotics:  Vanc 06/21/13>>>  Zosyn 06/21/13>>>2/7  Primaxin 2/7>>>  HPI/Subjective: No acute events noted overnight  Objective: Filed Vitals:   06/22/13 2157 06/22/13 2320 06/23/13 0043 06/23/13 0657  BP: 135/81   137/84  Pulse: 110 111  63  Temp: 103 F (39.4 C)  100.3 F (37.9 C) 98.7 F (37.1 C)  TempSrc: Oral  Oral Oral  Resp: 20 22  20   Height:      Weight:      SpO2: 95% 97%  100%    Intake/Output Summary (Last 24 hours) at 06/23/13 1244 Last data filed at 06/23/13 1206  Gross per 24 hour  Intake   1200 ml  Output   1300 ml  Net   -100 ml   Filed Weights   06/21/13 2230  Weight: 102.1 kg (225 lb 1.4 oz)    Exam:   General:  Awake, in nad  Cardiovascular: regular, s1, s2  Respiratory: normal resp effort, no wheezing  Abdomen: soft, nondistended  Musculoskeletal: perfused, contractures over UE B   Data Reviewed: Basic Metabolic Panel:  Recent Labs Lab 06/21/13 2010 06/22/13 0535 06/23/13 0543  NA 133* 133* 137  K 4.5 6.0* 3.9  CL 99 101 103  CO2 23 20 21   GLUCOSE 122* 108* 108*  BUN 17 16 13   CREATININE 0.46* 0.47* 0.49*  CALCIUM 8.9  8.4 8.6   Liver Function Tests:  Recent Labs Lab 06/21/13 2010  AST 16  ALT 17  ALKPHOS 105  BILITOT <0.2*  PROT 8.1  ALBUMIN 2.7*   No results found for this basename: LIPASE, AMYLASE,  in the last 168 hours No results found for this basename: AMMONIA,  in the last 168 hours CBC:  Recent Labs Lab 06/21/13 2010 06/22/13 0656 06/23/13 0543  WBC 15.5* 18.4* 16.3*  NEUTROABS 13.9*  --  11.5*  HGB 9.2* 9.0* 9.3*  HCT 29.3* 28.5* 29.4*  MCV 76.5* 76.6* 76.6*  PLT 374 335 349   Cardiac Enzymes: No results found for this basename: CKTOTAL, CKMB, CKMBINDEX, TROPONINI,  in the last 168 hours BNP (last 3 results) No results found for this basename: PROBNP,  in the last 8760 hours CBG: No results found for this basename: GLUCAP,  in the last 168 hours  Recent Results (from the past 240 hour(s))  URINE CULTURE     Status: None   Collection Time    06/21/13  6:40 PM      Result Value Range Status   Specimen Description URINE, CATHETERIZED   Final   Special Requests NONE   Final   Culture  Setup Time     Final   Value: 06/22/2013 00:42     Performed at [facility]   [place] PENDING   Incomplete   Culture     Final   Value: Culture reincubated for better growth     Performed at [facility]   Report Status PENDING   Incomplete     Studies: Dg Chest 2 View  06/21/2013   CLINICAL DATA:  Fever  EXAM: CHEST  2 VIEW  COMPARISON:  [place] ABSCESS DRAINAGE dated 02/22/2013  FINDINGS: Low lung volumes. Cardiac silhouette is enlarged. There is decreased prominence of the interstitial markings. No focal regions of consolidation identified. The osseous structures demonstrate dextroscoliosis in the thoracolumbar spine.  IMPRESSION: Interstitial findings are less prominent when compared to previous study. An interstitial infectious or inflammatory infiltrate cannot be excluded. No component of these findings likely represent chronic bronchitic changes.   Electronically  Signed   By: Margaree Mackintosh M.D.   On: 06/21/2013 18:46    Scheduled Meds: . amLODipine  5 mg Oral q morning - 10a  . baclofen  20 mg Oral QID  . docusate sodium  100 mg Oral BID  . famotidine  20 mg Oral BID  . furosemide  20 mg Oral BID  . heparin  5,000 Units Subcutaneous Q8H  . imipenem-cilastatin  500 mg Intravenous Q6H  . metoCLOPramide  10 mg Oral TID AC  . multivitamin with minerals  1 tablet Oral q morning - 10a  . vancomycin  1,000 mg Intravenous Q12H  . zinc sulfate  220 mg Oral q morning - 10a   Continuous Infusions: . sodium chloride    . sodium chloride 75 mL/hr at 06/22/13 0017    Principal Problem:   Complicated UTI (urinary tract infection) Active Problems:   Sacral decubitus ulcer, stage IV   Suprapubic catheter   Chronic osteomyelitis, pelvic region and thigh   UTI (lower urinary tract infection)  Time spent: 68min  Anvita Hirata, [facility] Pager [phone]. If 7PM-7AM, please contact night-coverage at [email or web address], password [email or web address] 06/23/2013, 12:44 PM  LOS: 2 days

## 2013-06-24 ENCOUNTER — Encounter (HOSPITAL_BASED_OUTPATIENT_CLINIC_OR_DEPARTMENT_OTHER): Payer: Medicare Other | Attending: Plastic Surgery

## 2013-06-24 DIAGNOSIS — B965 Pseudomonas (aeruginosa) (mallei) (pseudomallei) as the cause of diseases classified elsewhere: Secondary | ICD-10-CM

## 2013-06-24 DIAGNOSIS — L8994 Pressure ulcer of unspecified site, stage 4: Secondary | ICD-10-CM

## 2013-06-24 DIAGNOSIS — M8668 Other chronic osteomyelitis, other site: Secondary | ICD-10-CM

## 2013-06-24 DIAGNOSIS — D649 Anemia, unspecified: Secondary | ICD-10-CM

## 2013-06-24 DIAGNOSIS — L98499 Non-pressure chronic ulcer of skin of other sites with unspecified severity: Secondary | ICD-10-CM | POA: Insufficient documentation

## 2013-06-24 LAB — URINE CULTURE: Colony Count: >=100000

## 2013-06-24 LAB — VANCOMYCIN, TROUGH: VANCOMYCIN TR: 10.4 ug/mL (ref 10.0–20.0)

## 2013-06-24 MED ORDER — PIPERACILLIN-TAZOBACTAM 3.375 G IVPB
3.3750 g | Freq: Three times a day (TID) | INTRAVENOUS | Status: DC
  Administered 2013-06-24 – 2013-07-01 (×22): 3.375 g via INTRAVENOUS
  Filled 2013-06-24 (×25): qty 50

## 2013-06-24 MED ORDER — VANCOMYCIN HCL 500 MG IV SOLR
500.0000 mg | Freq: Once | INTRAVENOUS | Status: AC
  Administered 2013-06-24: 500 mg via INTRAVENOUS
  Filled 2013-06-24: qty 500

## 2013-06-24 MED ORDER — SACCHAROMYCES BOULARDII 250 MG PO CAPS
250.0000 mg | ORAL_CAPSULE | Freq: Two times a day (BID) | ORAL | Status: DC
  Administered 2013-06-24 – 2013-07-01 (×15): 250 mg via ORAL
  Filled 2013-06-24 (×16): qty 1

## 2013-06-24 MED ORDER — VANCOMYCIN HCL 10 G IV SOLR
1500.0000 mg | Freq: Two times a day (BID) | INTRAVENOUS | Status: DC
  Administered 2013-06-25 – 2013-06-30 (×11): 1500 mg via INTRAVENOUS
  Filled 2013-06-24 (×13): qty 1500

## 2013-06-24 NOTE — Progress Notes (Signed)
Placed pt on CPAP 19cmH2O with no oxygen bled in per home settings. Sterile water was added for humidification. Pt looks comfortable and is tolerating CPAP well at this time. RT will continue to monitor as needed.

## 2013-06-24 NOTE — Consult Note (Signed)
WOC wound consult note Reason for Consult:Chronic non-healing Stage IV pressure ulcers.  Patient is well known to the [facility] nursing service from numerous hospital visits in the past and is currently followed by plastic surgery, Dr. [name].  Patient was to have had an appointment with Dr. [name] in the office today (outpatient wound care center at [name]), but Dr. [name] was not in the office.  Recommend Consultation with Dr. Leafy Ro partner.  If you agree, please order. Wound type:Chronic non-healing stage IV pressure ulcers Pressure Ulcer POA: Yes Measurement:30cm x 13cm area of involvement with several areas of depth and skin bridges.  Deepest depth area measures 4cm. Wound bed:red, moist, bleeding, friable tissue. Drainage (amount, consistency, odor) serosanguinous Periwound:intact, macerated, but patient and RN feel that it has improved since yesterday.  Patient states that previous treatment with hydrofera blue created too much moisture and maceration. Dressing procedure/placement/frequency: We will return to the previously successful regimen of twice daily normal saline dressings and encourage patient in repositioning and in ingestion of a protein-rich diet. Patient indicates understanding of concepts and rationale.  WOC ostomy consult note Stoma type/location: LLQ diverting colostomy Stomal assessment/size: 1 and 1/4 inch round stoma (approximate, visualized through pouch) Peristomal assessment: Not seen today Treatment options for stomal/peristomal skin: Not seen today Output: soft brown stool Ostomy pouching: 2pc., 2 and 3/4 inch ostomy pouching system Patient informed that I will order supplies for him but that our ostomy pouches do not have an air filter.  He accepts this supply change and appreciated information. [facility] nursing team will not follow, but will remain available to this patient, the nursing and medical team.  Please re-consult if needed. Thanks, Maudie Flakes,  MSN, RN, [place], [place], [place] [phone])

## 2013-06-24 NOTE — Progress Notes (Signed)
Patient is not ready to go on CPAP at this time. RT will check back again a little later. RT will continue to assist as needed.

## 2013-06-24 NOTE — Progress Notes (Signed)
PHARMACIST - PHYSICIAN COMMUNICATION CONCERNING:  Vancomycin   In brief, 25 yoM on IV abx for chronic complicated sacral decub ulcer (stage IV), complicated UTI, and chronic osteomyelitis with previous abscesses.  Please see pharmacy note earlier today for full details.   Vancomycin trough tonight = 10.4, subtherapeutic (goal 15-20).  Renal fxn stable.  Wt 102.1kg   RECOMMENDATION: Increase to Vancomycin 1500mg  IV q 12 hours.    Ralene Bathe, PharmD, BCPS 06/24/2013, 6:37 PM  Pager: [phone]

## 2013-06-24 NOTE — Progress Notes (Signed)
[facility] PROGRESS NOTE  [name] [name] RKY:[id number] DOB: 1966-04-30 DOA: 06/21/2013 PCP: [name], MD  Assessment/Plan: 1. Complicated UTI with suprapubic cath and sepsis 1. Febrile with tachycardia, rising leukocytosis 2. Initially on empiric vanc with zosyn changed to Primaxin per ID recs 3. Urine cx now with pseudomonas with recs for change to zosyn with vanc 2. Chronic osteomyelitis 1. Vanc was empirically started in the ED 2. Clinic notes reviewed. Pt completed abx for osteo in 11/14 3. MRI pending 3. DVT prophylaxis 1. Heparin subQ 4. Hyperkalemia 1. Hold potassium 2. Cont to monitor 3. Resolved 5. HTN 1. Stable and controlled 2. Cont current regimen  Code Status: Full Family Communication: Pt in room  Disposition Plan: Pending   Consultants:  ID  Procedures:    Antibiotics:  Vanc 06/21/13>>>  Zosyn 06/21/13>>>2/7, resumed 2/9>>>  Primaxin 2/7>>>2/9  HPI/Subjective: No acute events noted overnight. Feels better  Objective: Filed Vitals:   06/23/13 2300 06/24/13 0000 06/24/13 0625 06/24/13 1519  BP: 112/71  105/68 112/72  Pulse: 90 92 80 100  Temp: 98.7 F (37.1 C)  98.1 F (36.7 C) 98.9 F (37.2 C)  TempSrc: Oral  Oral Oral  Resp: 18 18 18 16   Height:      Weight:      SpO2: 97% 98% 99% 96%    Intake/Output Summary (Last 24 hours) at 06/24/13 1606 Last data filed at 06/24/13 1520  Gross per 24 hour  Intake   1440 ml  Output   1550 ml  Net   -110 ml   Filed Weights   06/21/13 2230  Weight: 102.1 kg (225 lb 1.4 oz)    Exam:   General:  Awake, in nad  Cardiovascular: regular, s1, s2  Respiratory: normal resp effort, no wheezing  Abdomen: soft, nondistended  Musculoskeletal: perfused, contractures over UE B   Data Reviewed: Basic Metabolic Panel:  Recent Labs Lab 06/21/13 2010 06/22/13 0535 06/23/13 0543  NA 133* 133* 137  K 4.5 6.0* 3.9  CL 99 101 103  CO2 23 20 21   GLUCOSE 122* 108* 108*  BUN 17 16 13    CREATININE 0.46* 0.47* 0.49*  CALCIUM 8.9 8.4 8.6   Liver Function Tests:  Recent Labs Lab 06/21/13 2010  AST 16  ALT 17  ALKPHOS 105  BILITOT <0.2*  PROT 8.1  ALBUMIN 2.7*   No results found for this basename: LIPASE, AMYLASE,  in the last 168 hours No results found for this basename: AMMONIA,  in the last 168 hours CBC:  Recent Labs Lab 06/21/13 2010 06/22/13 0656 06/23/13 0543  WBC 15.5* 18.4* 16.3*  NEUTROABS 13.9*  --  11.5*  HGB 9.2* 9.0* 9.3*  HCT 29.3* 28.5* 29.4*  MCV 76.5* 76.6* 76.6*  PLT 374 335 349   Cardiac Enzymes: No results found for this basename: CKTOTAL, CKMB, CKMBINDEX, TROPONINI,  in the last 168 hours BNP (last 3 results) No results found for this basename: PROBNP,  in the last 8760 hours CBG: No results found for this basename: GLUCAP,  in the last 168 hours  Recent Results (from the past 240 hour(s))  URINE CULTURE     Status: None   Collection Time    06/21/13  6:40 PM      Result Value Range Status   Specimen Description URINE, CATHETERIZED   Final   Special Requests NONE   Final   Culture  Setup Time     Final   Value: 06/22/2013 00:42  Performed at [facility]     Final   Value: >=100,000 COLONIES/ML     Performed at [facility]   Culture     Final   Value: PROVIDENCIA STUARTII     PSEUDOMONAS AERUGINOSA     Performed at [facility]   Report Status 06/24/2013 FINAL   Final   Organism ID, Bacteria PROVIDENCIA STUARTII   Final   Organism ID, Bacteria PSEUDOMONAS AERUGINOSA   Final  CULTURE, BLOOD (ROUTINE X 2)     Status: None   Collection Time    06/21/13  8:10 PM      Result Value Range Status   Specimen Description BLOOD LEFT ARM   Final   Special Requests BOTTLES DRAWN AEROBIC AND ANAEROBIC 5CC   Final   Culture  Setup Time     Final   Value: 06/22/2013 00:32     Performed at [facility]   Culture     Final   Value:        BLOOD CULTURE RECEIVED NO GROWTH TO DATE  CULTURE WILL BE HELD FOR 5 DAYS BEFORE ISSUING A FINAL NEGATIVE REPORT     Performed at [facility]   Report Status PENDING   Incomplete     Studies: No results found.  Scheduled Meds: . amLODipine  5 mg Oral q morning - 10a  . baclofen  20 mg Oral QID  . docusate sodium  100 mg Oral BID  . famotidine  20 mg Oral BID  . furosemide  20 mg Oral BID  . heparin  5,000 Units Subcutaneous Q8H  . metoCLOPramide  10 mg Oral TID AC  . multivitamin with minerals  1 tablet Oral q morning - 10a  . piperacillin-tazobactam (ZOSYN)  IV  3.375 g Intravenous Q8H  . saccharomyces boulardii  250 mg Oral BID  . vancomycin  1,000 mg Intravenous Q12H  . zinc sulfate  220 mg Oral q morning - 10a   Continuous Infusions: . sodium chloride    . sodium chloride 75 mL/hr at 06/24/13 1404    Principal Problem:   Complicated UTI (urinary tract infection) Active Problems:   Sacral decubitus ulcer, stage IV   Suprapubic catheter   Chronic osteomyelitis, pelvic region and thigh   UTI (lower urinary tract infection)  Time spent: 69min  [facility], [place] [facility] Pager [phone]. If 7PM-7AM, please contact night-coverage at [email or web address], password [facility] 06/24/2013, 4:06 PM  LOS: 3 days

## 2013-06-24 NOTE — Progress Notes (Signed)
ANTIBIOTIC CONSULT NOTE - FOLLOW UP  Pharmacy Consult for Vancomycin/Zosyn  Indication: osteomyelitis, decubitus ulcer, uti   Allergies  Allergen Reactions  . Ditropan [Oxybutynin] Other (See Comments)    hallucinations    Patient Measurements: Height: 6' (182.9 cm) Weight: 225 lb 1.4 oz (102.1 kg) IBW/kg (Calculated) : 77.6  Vital Signs: Temp: 98.1 F (36.7 C) (02/09 0625) Temp src: Oral (02/09 0625) BP: 105/68 mmHg (02/09 0625) Pulse Rate: 80 (02/09 0625) Intake/Output from previous day: 02/08 0701 - 02/09 0700 In: 1440 [P.O.:1440] Out: 700 [Urine:700] Intake/Output from this shift: Total I/O In: 480 [P.O.:480] Out: -   Labs:  Recent Labs  06/21/13 2010 06/22/13 0535 06/22/13 0656 06/23/13 0543  WBC 15.5*  --  18.4* 16.3*  HGB 9.2*  --  9.0* 9.3*  PLT 374  --  335 349  CREATININE 0.46* 0.47*  --  0.49*   Estimated Creatinine Clearance: 142.6 ml/min (by C-G formula based on Cr of 0.49). No results found for this basename: VANCOTROUGH, Corlis Leak, VANCORANDOM, [place], GENTPEAK, GENTRANDOM, TOBRATROUGH, TOBRAPEAK, TOBRARND, AMIKACINPEAK, AMIKACINTROU, AMIKACIN,  in the last 72 hours   Microbiology: Recent Results (from the past 720 hour(s))  URINE CULTURE     Status: None   Collection Time    06/21/13  6:40 PM      Result Value Range Status   Specimen Description URINE, CATHETERIZED   Final   Special Requests NONE   Final   Culture  Setup Time     Final   Value: 06/22/2013 00:42     Performed at [place]     Final   Value: >=100,000 COLONIES/ML     Performed at [facility]   Culture     Final   Value: PROVIDENCIA STUARTII     PSEUDOMONAS AERUGINOSA     Performed at [facility]   Report Status 06/24/2013 FINAL   Final   Organism ID, Bacteria PROVIDENCIA STUARTII   Final   Organism ID, Bacteria PSEUDOMONAS AERUGINOSA   Final  CULTURE, BLOOD (ROUTINE X 2)     Status: None   Collection Time    06/21/13   8:10 PM      Result Value Range Status   Specimen Description BLOOD LEFT ARM   Final   Special Requests BOTTLES DRAWN AEROBIC AND ANAEROBIC 5CC   Final   Culture  Setup Time     Final   Value: 06/22/2013 00:32     Performed at [facility]   Culture     Final   Value:        BLOOD CULTURE RECEIVED NO GROWTH TO DATE CULTURE WILL BE HELD FOR 5 DAYS BEFORE ISSUING A FINAL NEGATIVE REPORT     Performed at [facility]   Report Status PENDING   Incomplete    Anti-infectives   Start     Dose/Rate Route Frequency Ordered Stop   06/24/13 1400  piperacillin-tazobactam (ZOSYN) IVPB 3.375 g     3.375 g 12.5 mL/hr over 240 Minutes Intravenous 3 times per day 06/24/13 1236     06/22/13 1800  imipenem-cilastatin (PRIMAXIN) 500 mg in sodium chloride 0.9 % 100 mL IVPB  Status:  Discontinued     500 mg 200 mL/hr over 30 Minutes Intravenous 4 times per day 06/22/13 1743 06/24/13 1225   06/22/13 0600  vancomycin (VANCOCIN) IVPB 1000 mg/200 mL premix     1,000 mg 200 mL/hr over 60 Minutes Intravenous Every  12 hours 06/22/13 0308     06/22/13 0600  piperacillin-tazobactam (ZOSYN) IVPB 3.375 g  Status:  Discontinued     3.375 g 12.5 mL/hr over 240 Minutes Intravenous 3 times per day 06/22/13 0308 06/22/13 1743   06/21/13 2200  ceFEPIme (MAXIPIME) 1 g in dextrose 5 % 50 mL IVPB  Status:  Discontinued     1 g 100 mL/hr over 30 Minutes Intravenous Every 12 hours 06/21/13 2158 06/21/13 2204   06/21/13 2100  vancomycin (VANCOCIN) IVPB 1000 mg/200 mL premix     1,000 mg 200 mL/hr over 60 Minutes Intravenous  Once 06/21/13 2022 06/21/13 2230   06/21/13 2045  piperacillin-tazobactam (ZOSYN) IVPB 3.375 g     3.375 g 100 mL/hr over 30 Minutes Intravenous  Once 06/21/13 2022 06/21/13 2128      Assessment:  48 yo M with hx of complicated UTI, Sacral decubitus ulcer (stage IV), suprapubic catheter, chronic OM through pelvic region and thigh.   Patient is on D#3 Vancomycin, D#3 Primaxin.  Urine  culture today shows > 100k  Providencia and Psuedomonas, Both sensitive to Zosyn, will change to zosyn per pharmacy.  ID following.   2/7 >> Vanc >> 2/7 >> Zosyn >> 2/7 2/7 >> Primaxin >> 2/9 2/9 >> Zosyn >>   Micro 2/6 Blood x 1: NGTD 2.6 Urine: > 100k Pseudomonas (Sens to AG, 4th generation ceph, Zosyn)/Providencia (Sensitive to zosyn/rocephin)  Goal of Therapy:  Vancomycin Trough 15-20  Zosyn per renal function  eradication of infection   Plan:  1.) Continue Vancomycin 1 gram IV q12h - Vancomycin trough tonight at 1730 to ensure therapeutic levels  2.) D/C primaxin per ID and Start Zosyn 3.) Zosyn 3.375 grams IV q8h, infuse over 4 hours  4.) Monitor renal function, continue to check VT's as needed.  F/u blood culture    Jacari Kirsten, Gaye Alken PharmD Pager #: [phone] 12:47 PM 06/24/2013

## 2013-06-24 NOTE — Progress Notes (Signed)
INFECTIOUS DISEASE PROGRESS NOTE  ID: [name] [name] is a 48 y.o. male with  Principal Problem:   Complicated UTI (urinary tract infection) Active Problems:   Sacral decubitus ulcer, stage IV   Suprapubic catheter   Chronic osteomyelitis, pelvic region and thigh   UTI (lower urinary tract infection)  Subjective: Without complaints  Abtx:  Anti-infectives   Start     Dose/Rate Route Frequency Ordered Stop   06/22/13 1800  imipenem-cilastatin (PRIMAXIN) 500 mg in sodium chloride 0.9 % 100 mL IVPB     500 mg 200 mL/hr over 30 Minutes Intravenous 4 times per day 06/22/13 1743     06/22/13 0600  vancomycin (VANCOCIN) IVPB 1000 mg/200 mL premix     1,000 mg 200 mL/hr over 60 Minutes Intravenous Every 12 hours 06/22/13 0308     06/22/13 0600  piperacillin-tazobactam (ZOSYN) IVPB 3.375 g  Status:  Discontinued     3.375 g 12.5 mL/hr over 240 Minutes Intravenous 3 times per day 06/22/13 0308 06/22/13 1743   06/21/13 2200  ceFEPIme (MAXIPIME) 1 g in dextrose 5 % 50 mL IVPB  Status:  Discontinued     1 g 100 mL/hr over 30 Minutes Intravenous Every 12 hours 06/21/13 2158 06/21/13 2204   06/21/13 2100  vancomycin (VANCOCIN) IVPB 1000 mg/200 mL premix     1,000 mg 200 mL/hr over 60 Minutes Intravenous  Once 06/21/13 2022 06/21/13 2230   06/21/13 2045  piperacillin-tazobactam (ZOSYN) IVPB 3.375 g     3.375 g 100 mL/hr over 30 Minutes Intravenous  Once 06/21/13 2022 06/21/13 2128      Medications:  Scheduled: . amLODipine  5 mg Oral q morning - 10a  . baclofen  20 mg Oral QID  . docusate sodium  100 mg Oral BID  . famotidine  20 mg Oral BID  . furosemide  20 mg Oral BID  . heparin  5,000 Units Subcutaneous Q8H  . imipenem-cilastatin  500 mg Intravenous Q6H  . metoCLOPramide  10 mg Oral TID AC  . multivitamin with minerals  1 tablet Oral q morning - 10a  . vancomycin  1,000 mg Intravenous Q12H  . zinc sulfate  220 mg Oral q morning - 10a    Objective: Vital signs in last 24  hours: Temp:  [98.1 F (36.7 C)-100.1 F (37.8 C)] 98.1 F (36.7 C) (02/09 0625) Pulse Rate:  [80-92] 80 (02/09 0625) Resp:  [18-20] 18 (02/09 0625) BP: (105-112)/(55-71) 105/68 mmHg (02/09 0625) SpO2:  [97 %-100 %] 99 % (02/09 0625)   General appearance: alert, cooperative and no distress Resp: clear to auscultation bilaterally Cardio: regular rate and rhythm GI: normal findings: bowel sounds normal and soft, non-tender  Lab Results  Recent Labs  06/22/13 0535 06/22/13 0656 06/23/13 0543  WBC  --  18.4* 16.3*  HGB  --  9.0* 9.3*  HCT  --  28.5* 29.4*  NA 133*  --  137  K 6.0*  --  3.9  CL 101  --  103  CO2 20  --  21  BUN 16  --  13  CREATININE 0.47*  --  0.49*   Liver Panel  Recent Labs  06/21/13 2010  PROT 8.1  ALBUMIN 2.7*  AST 16  ALT 17  ALKPHOS 105  BILITOT <0.2*    Microbiology: Recent Results (from the past 240 hour(s))  URINE CULTURE     Status: None   Collection Time    06/21/13  6:40 PM  Result Value Range Status   Specimen Description URINE, CATHETERIZED   Final   Special Requests NONE   Final   Culture  Setup Time     Final   Value: 06/22/2013 00:42     Performed at [place]     Final   Value: >=100,000 COLONIES/ML     Performed at [facility]   Culture     Final   Value: PROVIDENCIA STUARTII     PSEUDOMONAS AERUGINOSA     Performed at [facility]   Report Status 06/24/2013 FINAL   Final   Organism ID, Bacteria PROVIDENCIA STUARTII   Final   Organism ID, Bacteria PSEUDOMONAS AERUGINOSA   Final  CULTURE, BLOOD (ROUTINE X 2)     Status: None   Collection Time    06/21/13  8:10 PM      Result Value Range Status   Specimen Description BLOOD LEFT ARM   Final   Special Requests BOTTLES DRAWN AEROBIC AND ANAEROBIC 5CC   Final   Culture  Setup Time     Final   Value: 06/22/2013 00:32     Performed at [facility]   Culture     Final   Value:        BLOOD CULTURE RECEIVED NO  GROWTH TO DATE CULTURE WILL BE HELD FOR 5 DAYS BEFORE ISSUING A FINAL NEGATIVE REPORT     Performed at [facility]   Report Status PENDING   Incomplete   PsA: R imi, but S piptazo Provedencia: S piptazo  Assessment/Plan: Fever  Prev UTI  Complicated decubitus ulcer, chronic, with osteo, previous abscess  1) recommend to change to piptazo plus vancomycin due to sensitivities on urine culture  2) recommend to get  MRI of pelvis to f/u his previous osteo/abscess         Kaianna Dolezal Infectious Diseases (pager) [phone] (cell) [phone] [email or web address] 06/24/2013, 12:23 PM  LOS: 3 days

## 2013-06-25 NOTE — Progress Notes (Signed)
[name] [name]  [name] [name] PJK:[id number] DOB: 05-28-1965 DOA: 06/21/2013 PCP: [name], MD  Assessment/Plan: 1. Complicated UTI with suprapubic cath and sepsis 1. Febrile with tachycardia, rising leukocytosis 2. Initially on empiric vanc with zosyn changed to Primaxin per ID recs 3. Urine cx with pseudomonas with recs with abx per above 2. Chronic osteomyelitis 1. Vanc was empirically started in the ED 2. Clinic notes reviewed. Pt completed abx for osteo in 11/14 3. MRI pending 3. DVT prophylaxis 1. Heparin subQ 4. Hyperkalemia 1. Holding potassium 2. Resolved 3. Cont to monitor 5. HTN 1. Stable and controlled 2. Cont current regimen  Code Status: Full Family Communication: Pt in room  Disposition Plan: Pending   Consultants:  ID  Procedures:  MRI pending  Antibiotics:  Vanc 06/21/13>>>  Zosyn 06/21/13>>>2/7, resumed 2/9>>>  Primaxin 2/7>>>2/9  HPI/Subjective: No acute events noted. No complaints.  Objective: Filed Vitals:   06/24/13 2328 06/25/13 0604 06/25/13 0939 06/25/13 1458  BP:  137/87 169/98 142/84  Pulse: 95 92 112 107  Temp:  98.4 F (36.9 C)  99.1 F (37.3 C)  TempSrc:  Oral  Oral  Resp: 18 18    Height:      Weight:      SpO2: 96% 99% 96% 98%    Intake/Output Summary (Last 24 hours) at 06/25/13 1544 Last data filed at 06/25/13 1459  Gross per 24 hour  Intake   2040 ml  Output   5750 ml  Net  -3710 ml   Filed Weights   06/21/13 2230  Weight: 102.1 kg (225 lb 1.4 oz)    Exam:   General:  Awake, in nad  Cardiovascular: regular, s1, s2  Respiratory: normal resp effort, no wheezing  Abdomen: soft, nondistended  Musculoskeletal: perfused, contractures over UE B   Data Reviewed: Basic Metabolic Panel:  Recent Labs Lab 06/21/13 2010 06/22/13 0535 06/23/13 0543  NA 133* 133* 137  K 4.5 6.0* 3.9  CL 99 101 103  CO2 23 20 21   GLUCOSE 122* 108* 108*  BUN 17 16 13   CREATININE 0.46* 0.47* 0.49*   CALCIUM 8.9 8.4 8.6   Liver Function Tests:  Recent Labs Lab 06/21/13 2010  AST 16  ALT 17  ALKPHOS 105  BILITOT <0.2*  PROT 8.1  ALBUMIN 2.7*   No results found for this basename: LIPASE, AMYLASE,  in the last 168 hours No results found for this basename: AMMONIA,  in the last 168 hours CBC:  Recent Labs Lab 06/21/13 2010 06/22/13 0656 06/23/13 0543  WBC 15.5* 18.4* 16.3*  NEUTROABS 13.9*  --  11.5*  HGB 9.2* 9.0* 9.3*  HCT 29.3* 28.5* 29.4*  MCV 76.5* 76.6* 76.6*  PLT 374 335 349   Cardiac Enzymes: No results found for this basename: CKTOTAL, CKMB, CKMBINDEX, TROPONINI,  in the last 168 hours BNP (last 3 results) No results found for this basename: PROBNP,  in the last 8760 hours CBG: No results found for this basename: GLUCAP,  in the last 168 hours  Recent Results (from the past 240 hour(s))  URINE CULTURE     Status: None   Collection Time    06/21/13  6:40 PM      Result Value Range Status   Specimen Description URINE, CATHETERIZED   Final   Special Requests NONE   Final   Culture  Setup Time     Final   Value: 06/22/2013 00:42     Performed at [facility]  Colony Count     Final   Value: >=100,000 COLONIES/ML     Performed at [facility]   Culture     Final   Value: PROVIDENCIA STUARTII     PSEUDOMONAS AERUGINOSA     Performed at [facility]   Report Status 06/24/2013 FINAL   Final   Organism ID, Bacteria PROVIDENCIA STUARTII   Final   Organism ID, Bacteria PSEUDOMONAS AERUGINOSA   Final  CULTURE, BLOOD (ROUTINE X 2)     Status: None   Collection Time    06/21/13  8:10 PM      Result Value Range Status   Specimen Description BLOOD LEFT ARM   Final   Special Requests BOTTLES DRAWN AEROBIC AND ANAEROBIC 5CC   Final   Culture  Setup Time     Final   Value: 06/22/2013 00:32     Performed at [facility]   Culture     Final   Value:        BLOOD CULTURE RECEIVED NO GROWTH TO DATE CULTURE WILL BE HELD FOR 5 DAYS  BEFORE ISSUING A FINAL NEGATIVE REPORT     Performed at [facility]   Report Status PENDING   Incomplete     Studies: No results found.  Scheduled Meds: . amLODipine  5 mg Oral q morning - 10a  . baclofen  20 mg Oral QID  . docusate sodium  100 mg Oral BID  . famotidine  20 mg Oral BID  . furosemide  20 mg Oral BID  . heparin  5,000 Units Subcutaneous Q8H  . metoCLOPramide  10 mg Oral TID AC  . multivitamin with minerals  1 tablet Oral q morning - 10a  . piperacillin-tazobactam (ZOSYN)  IV  3.375 g Intravenous Q8H  . saccharomyces boulardii  250 mg Oral BID  . vancomycin  1,500 mg Intravenous Q12H  . zinc sulfate  220 mg Oral q morning - 10a   Continuous Infusions: . sodium chloride    . sodium chloride 75 mL/hr at 06/24/13 1404    Principal Problem:   Complicated UTI (urinary tract infection) Active Problems:   Sacral decubitus ulcer, stage IV   Suprapubic catheter   Chronic osteomyelitis, pelvic region and thigh   UTI (lower urinary tract infection)  Time spent: 63min  [name] [name], [facility] Pager [phone]. If 7PM-7AM, please contact night-coverage at [email or web address], password [facility] 06/25/2013, 3:44 PM  LOS: 4 days

## 2013-06-26 ENCOUNTER — Inpatient Hospital Stay (HOSPITAL_COMMUNITY): Payer: Medicare Other

## 2013-06-26 LAB — CBC WITH DIFFERENTIAL/PLATELET
BASOS ABS: 0 10*3/uL (ref 0.0–0.1)
Basophils Relative: 0 % (ref 0–1)
EOS ABS: 0.4 10*3/uL (ref 0.0–0.7)
EOS PCT: 3 % (ref 0–5)
HCT: 28.4 % — ABNORMAL LOW (ref 39.0–52.0)
Hemoglobin: 9 g/dL — ABNORMAL LOW (ref 13.0–17.0)
Lymphocytes Relative: 13 % (ref 12–46)
Lymphs Abs: 1.9 10*3/uL (ref 0.7–4.0)
MCH: 24.4 pg — AB (ref 26.0–34.0)
MCHC: 31.7 g/dL (ref 30.0–36.0)
MCV: 77 fL — AB (ref 78.0–100.0)
Monocytes Absolute: 1.4 10*3/uL — ABNORMAL HIGH (ref 0.1–1.0)
Monocytes Relative: 10 % (ref 3–12)
Neutro Abs: 10.8 10*3/uL — ABNORMAL HIGH (ref 1.7–7.7)
Neutrophils Relative %: 74 % (ref 43–77)
PLATELETS: 429 10*3/uL — AB (ref 150–400)
RBC: 3.69 MIL/uL — ABNORMAL LOW (ref 4.22–5.81)
RDW: 16 % — AB (ref 11.5–15.5)
WBC: 14.6 10*3/uL — ABNORMAL HIGH (ref 4.0–10.5)

## 2013-06-26 LAB — BASIC METABOLIC PANEL
BUN: 7 mg/dL (ref 6–23)
CALCIUM: 8.3 mg/dL — AB (ref 8.4–10.5)
CO2: 25 mEq/L (ref 19–32)
Chloride: 104 mEq/L (ref 96–112)
Creatinine, Ser: 0.44 mg/dL — ABNORMAL LOW (ref 0.50–1.35)
GFR calc Af Amer: >90 mL/min (ref >90)
GFR calc non Af Amer: >90 mL/min (ref >90)
GLUCOSE: 127 mg/dL — AB (ref 70–99)
Potassium: 3.2 mEq/L — ABNORMAL LOW (ref 3.7–5.3)
SODIUM: 141 meq/L (ref 137–147)

## 2013-06-26 LAB — C-REACTIVE PROTEIN: CRP: 13 mg/dL — ABNORMAL HIGH (ref <0.60)

## 2013-06-26 LAB — SEDIMENTATION RATE: Sed Rate: 103 mm/hr — ABNORMAL HIGH (ref 0–16)

## 2013-06-26 MED ORDER — GADOBENATE DIMEGLUMINE 529 MG/ML IV SOLN
20.0000 mL | Freq: Once | INTRAVENOUS | Status: AC | PRN
  Administered 2013-06-26: 20 mL via INTRAVENOUS

## 2013-06-26 MED ORDER — POTASSIUM CHLORIDE CRYS ER 20 MEQ PO TBCR
30.0000 meq | EXTENDED_RELEASE_TABLET | Freq: Once | ORAL | Status: AC
  Administered 2013-06-26: 30 meq via ORAL
  Filled 2013-06-26: qty 1

## 2013-06-26 NOTE — Progress Notes (Addendum)
Late entry due to system downtime: Pt was ready for bed and cpap about 0100 this morning.  Pt placed on cpap at 19cm h2o per his home settings, with full face mask as he wears this at home.  Pt was tolerating well at that time.  HR 99, rr16, sats 97%.  Sterile water added to humidification chamber.

## 2013-06-26 NOTE — Progress Notes (Signed)
INFECTIOUS DISEASE PROGRESS NOTE  ID: [name] [name] is a 48 y.o. male with  Principal Problem:   Complicated UTI (urinary tract infection) Active Problems:   Sacral decubitus ulcer, stage IV   Suprapubic catheter   Chronic osteomyelitis, pelvic region and thigh   UTI (lower urinary tract infection)  Subjective: Without complaints  24hr: underwent mri today Abtx:  Anti-infectives   Start     Dose/Rate Route Frequency Ordered Stop   06/25/13 0600  vancomycin (VANCOCIN) 1,500 mg in sodium chloride 0.9 % 500 mL IVPB     1,500 mg 250 mL/hr over 120 Minutes Intravenous Every 12 hours 06/24/13 1839     06/24/13 1930  vancomycin (VANCOCIN) 500 mg in sodium chloride 0.9 % 100 mL IVPB     500 mg 100 mL/hr over 60 Minutes Intravenous  Once 06/24/13 1839 06/24/13 2112   06/24/13 1400  piperacillin-tazobactam (ZOSYN) IVPB 3.375 g     3.375 g 12.5 mL/hr over 240 Minutes Intravenous 3 times per day 06/24/13 1236     06/22/13 1800  imipenem-cilastatin (PRIMAXIN) 500 mg in sodium chloride 0.9 % 100 mL IVPB  Status:  Discontinued     500 mg 200 mL/hr over 30 Minutes Intravenous 4 times per day 06/22/13 1743 06/24/13 1225   06/22/13 0600  vancomycin (VANCOCIN) IVPB 1000 mg/200 mL premix  Status:  Discontinued     1,000 mg 200 mL/hr over 60 Minutes Intravenous Every 12 hours 06/22/13 0308 06/24/13 1838   06/22/13 0600  piperacillin-tazobactam (ZOSYN) IVPB 3.375 g  Status:  Discontinued     3.375 g 12.5 mL/hr over 240 Minutes Intravenous 3 times per day 06/22/13 0308 06/22/13 1743   06/21/13 2200  ceFEPIme (MAXIPIME) 1 g in dextrose 5 % 50 mL IVPB  Status:  Discontinued     1 g 100 mL/hr over 30 Minutes Intravenous Every 12 hours 06/21/13 2158 06/21/13 2204   06/21/13 2100  vancomycin (VANCOCIN) IVPB 1000 mg/200 mL premix     1,000 mg 200 mL/hr over 60 Minutes Intravenous  Once 06/21/13 2022 06/26/13 0919   06/21/13 2045  piperacillin-tazobactam (ZOSYN) IVPB 3.375 g     3.375 g 100 mL/hr  over 30 Minutes Intravenous  Once 06/21/13 2022 06/21/13 2128      Medications:  Scheduled: . amLODipine  5 mg Oral q morning - 10a  . baclofen  20 mg Oral QID  . docusate sodium  100 mg Oral BID  . famotidine  20 mg Oral BID  . furosemide  20 mg Oral BID  . heparin  5,000 Units Subcutaneous 3 times per day  . metoCLOPramide  10 mg Oral TID AC  . multivitamin with minerals  1 tablet Oral q morning - 10a  . piperacillin-tazobactam (ZOSYN)  IV  3.375 g Intravenous 3 times per day  . saccharomyces boulardii  250 mg Oral BID  . vancomycin  1,500 mg Intravenous Q12H  . zinc sulfate  220 mg Oral q morning - 10a    Objective: Vital signs in last 24 hours: Temp:  [98.5 F (36.9 C)-98.8 F (37.1 C)] 98.8 F (37.1 C) (02/11 0724) Pulse Rate:  [99-102] 102 (02/11 0724) Resp:  [16-20] 20 (02/11 0724) BP: (94-137)/(58-84) 137/84 mmHg (02/11 0724) SpO2:  [94 %-97 %] 94 % (02/11 0724)   General appearance: alert, cooperative and no distress Resp: clear to auscultation bilaterally Cardio: regular rate and rhythm GI: normal findings: bowel sounds normal and soft, non-tender  Lab Results No results found  for this basename: WBC, HGB, HCT, PLATELETS, NA, K, CL, CO2, BUN, CREATININE, GLU,  in the last 72 hours Liver Panel No results found for this basename: PROT, ALBUMIN, AST, ALT, ALKPHOS, BILITOT, BILIDIR, IBILI,  in the last 72 hours  Microbiology: Recent Results (from the past 240 hour(s))  URINE CULTURE     Status: None   Collection Time    06/21/13  6:40 PM      Result Value Ref Range Status   Specimen Description URINE, CATHETERIZED   Final   Special Requests NONE   Final   Culture  Setup Time     Final   Value: 06/22/2013 00:42     Performed at [place]     Final   Value: >=100,000 COLONIES/ML     Performed at [facility]   Culture     Final   Value: PROVIDENCIA STUARTII     PSEUDOMONAS AERUGINOSA     Performed at [facility]     Report Status 06/24/2013 FINAL   Final   Organism ID, Bacteria PROVIDENCIA STUARTII   Final   Organism ID, Bacteria PSEUDOMONAS AERUGINOSA   Final  CULTURE, BLOOD (ROUTINE X 2)     Status: None   Collection Time    06/21/13  8:10 PM      Result Value Ref Range Status   Specimen Description BLOOD LEFT ARM   Final   Special Requests BOTTLES DRAWN AEROBIC AND ANAEROBIC 5CC   Final   Culture  Setup Time     Final   Value: 06/22/2013 00:32     Performed at [facility]   Culture     Final   Value:        BLOOD CULTURE RECEIVED NO GROWTH TO DATE CULTURE WILL BE HELD FOR 5 DAYS BEFORE ISSUING A FINAL NEGATIVE REPORT     Performed at [facility]   Report Status PENDING   Incomplete   PsA: R imi, but S piptazo Provedencia: S piptazo  Imaging: MRI pelvis: There is 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 There is a peripherally enhancing small  fluid collection measuring 4.5 x 1.3 cm superior to the femoral  head. There is a large overlying decubitus ulcer with enhancement at  the margin of the ulcer.  There is bone destruction of the left hip. There is no significant  osseous signal abnormality. There is soft tissue enhancement within  the left hip joint space. There is a peripherally enhancing fluid  collection posterior to the hip measuring 6 x 1.3 cm likely  representing a small abscess. There is a overlying skin defect  posterior to the ischium.  There is a sacral decubitus ulcer overlying the left SI joint with  mild marrow edema in the posterior sacrum and ilium with mild  enhancement concerning for osteomyelitis. There is no adjacent fluid  collection.  There is a suprapubic catheter within the bladder.     Assessment/Plan: Fever  Prev UTI  Complicated decubitus ulcer, chronic, with osteo, previous abscess  1) uti : continue on piptazo plus vancomycin due to sensitivities on urine culture  2)chronic osteo: MRI of pelvis shows chronic osteo but also small fluid collection. Please  ask IR if they are able to aspirate fluid from larger fluid collection or place drain to help with treatment. Will need to check sed rate and crp to see if need to do 6 wk of IV antibiotics vs. Continue on oral suppression, currently favoring the later.         Carlyle Basques Infectious Diseases (pager) [phone] (cell) [phone] [email or web address] 06/26/2013, 3:39 PM  LOS: 5 days

## 2013-06-26 NOTE — Progress Notes (Signed)
PIV assessment for routine restart. Reason PIV not rotated/poor venous access. Dressing and extension changed on existing PIV. Current PIV with normal limits but outdated. RN aware that current PIV was not rotated. Recommend PICC line for VANC infusions. Tasia Catchings RN, [facility].

## 2013-06-26 NOTE — Progress Notes (Signed)
[facility] PROGRESS NOTE  [name] [name] [id number] DOB: 10/20/1965 DOA: 06/21/2013 PCP: [name], MD  Assessment/Plan: 1. Complicated UTI with suprapubic cath and sepsis 1. Febrile with tachycardia, rising leukocytosis 2. Initially on empiric vanc with zosyn changed to Primaxin per ID recs 3. Urine cx with pseudomonas with recs with abx per above 2. Chronic osteomyelitis 1. Vanc was empirically started in the ED 2. Clinic notes reviewed. Pt completed abx for osteo in 11/14 3. MRI with chronic osteomyelitis, fluid collections. Will try and ask IR to drain.  3. DVT prophylaxis 1. Heparin subQ 4. Hyperkalemia 1. Holding potassium 2. Resolved 3. Cont to monitor 5. HTN 1. Stable and controlled 2. Cont current regimen  Code Status: Full Family Communication: Pt in room  Disposition Plan: Pending  Consultants:  ID  Procedures:  MRI   Antibiotics:  Vanc 06/21/13>>>  Zosyn 06/21/13>>>2/7, resumed 2/9>>>  Primaxin 2/7>>>2/9  HPI/Subjective: No acute events noted. No complaints.  Objective: Filed Vitals:   06/25/13 1458 06/25/13 2053 06/26/13 0100 06/26/13 0724  BP: 142/84 94/58  137/84  Pulse: 107 99 99 102  Temp: 99.1 F (37.3 C) 98.5 F (36.9 C)  98.8 F (37.1 C)  TempSrc: Oral Oral  Oral  Resp:  20 16 20   Height:      Weight:      SpO2: 98% 96% 97% 94%    Intake/Output Summary (Last 24 hours) at 06/26/13 1811 Last data filed at 06/26/13 1700  Gross per 24 hour  Intake   1200 ml  Output   3375 ml  Net  -2175 ml   Filed Weights   06/21/13 2230  Weight: 102.1 kg (225 lb 1.4 oz)    Exam:  General:  Awake, in nad  Cardiovascular: regular, s1, s2  Respiratory: normal resp effort, no wheezing  Abdomen: soft, nondistended  Musculoskeletal: perfused, contractures over UE B   Data Reviewed: Basic Metabolic Panel:  Recent Labs Lab 06/21/13 2010 06/22/13 0535 06/23/13 0543 06/26/13 1615  NA 133* 133* 137 141  K 4.5 6.0* 3.9  3.2*  CL 99 101 103 104  CO2 23 20 21 25   GLUCOSE 122* 108* 108* 127*  BUN 17 16 13 7   CREATININE 0.46* 0.47* 0.49* 0.44*  CALCIUM 8.9 8.4 8.6 8.3*   Liver Function Tests:  Recent Labs Lab 06/21/13 2010  AST 16  ALT 17  ALKPHOS 105  BILITOT <0.2*  PROT 8.1  ALBUMIN 2.7*   No results found for this basename: LIPASE, AMYLASE,  in the last 168 hours No results found for this basename: AMMONIA,  in the last 168 hours CBC:  Recent Labs Lab 06/21/13 2010 06/22/13 0656 06/23/13 0543 06/26/13 1615  WBC 15.5* 18.4* 16.3* 14.6*  NEUTROABS 13.9*  --  11.5* 10.8*  HGB 9.2* 9.0* 9.3* 9.0*  HCT 29.3* 28.5* 29.4* 28.4*  MCV 76.5* 76.6* 76.6* 77.0*  PLT 374 335 349 429*   Cardiac Enzymes: No results found for this basename: CKTOTAL, CKMB, CKMBINDEX, TROPONINI,  in the last 168 hours BNP (last 3 results) No results found for this basename: PROBNP,  in the last 8760 hours CBG: No results found for this basename: GLUCAP,  in the last 168 hours  Recent Results (from the past 240 hour(s))  URINE CULTURE     Status: None   Collection Time    06/21/13  6:40 PM      Result Value Ref Range Status   Specimen Description URINE, CATHETERIZED   Final  Special Requests NONE   Final   Culture  Setup Time     Final   Value: 06/22/2013 00:42     Performed at [place]     Final   Value: >=100,000 COLONIES/ML     Performed at [facility]   Culture     Final   Value: PROVIDENCIA STUARTII     PSEUDOMONAS AERUGINOSA     Performed at [facility]   Report Status 06/24/2013 FINAL   Final   Organism ID, Bacteria PROVIDENCIA STUARTII   Final   Organism ID, Bacteria PSEUDOMONAS AERUGINOSA   Final  CULTURE, BLOOD (ROUTINE X 2)     Status: None   Collection Time    06/21/13  8:10 PM      Result Value Ref Range Status   Specimen Description BLOOD LEFT ARM   Final   Special Requests BOTTLES DRAWN AEROBIC AND ANAEROBIC 5CC   Final   Culture  Setup  Time     Final   Value: 06/22/2013 00:32     Performed at [facility]   Culture     Final   Value:        BLOOD CULTURE RECEIVED NO GROWTH TO DATE CULTURE WILL BE HELD FOR 5 DAYS BEFORE ISSUING A FINAL NEGATIVE REPORT     Performed at [facility]   Report Status PENDING   Incomplete     Studies: Mr Pelvis W Wo Contrast  06/26/2013   CLINICAL DATA:  Chronic pelvic osteomyelitis. Sacral decubitus ulcer.  EXAM: MRI PELVIS WITHOUT AND WITH CONTRAST  TECHNIQUE: Multiplanar multisequence MR imaging of the pelvis was performed both before and after administration of intravenous contrast.  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 There is a peripherally enhancing small fluid collection measuring 4.5 x 1.3 cm superior to the femoral head. There is a large overlying decubitus ulcer with enhancement at the margin of the ulcer.  There is bone destruction of the left hip. There is no significant osseous signal abnormality. There is soft tissue enhancement within the left hip joint space. There is a peripherally enhancing fluid collection posterior to the hip measuring 6 x 1.3 cm likely representing a small abscess. There is a overlying skin defect posterior to the ischium.  There is a sacral decubitus ulcer overlying the left SI joint with mild marrow edema in the posterior sacrum and ilium with mild enhancement concerning for osteomyelitis. There is no adjacent fluid collection.  There is a suprapubic catheter within the bladder.  IMPRESSION: 1. There are chronic destructive osseous changes of bilateral hips with irregular  soft tissue enhancement within bilateral hip joints and adjacent fluid collections. There is no significant marrow signal abnormality. The overall appearance is consistent with chronic osteomyelitis with the fluid collections concerning for abscesses. Overall there is no significant changes in the appearance of bilateral hips compared with the CT of the pelvis performed on 02/27/2013. 2. There is a sacral decubitus ulcer overlying the left SI joint with mild marrow edema of the posterior left sacrum and ilium with enhancement concerning for osteomyelitis.  Electronically Signed   By: Kathreen Devoid   On: 06/26/2013 12:39    Scheduled Meds: . amLODipine  5 mg Oral q morning - 10a  . baclofen  20 mg Oral QID  . docusate sodium  100 mg Oral BID  . famotidine  20 mg Oral BID  . furosemide  20 mg Oral BID  . heparin  5,000 Units Subcutaneous 3 times per day  . metoCLOPramide  10 mg Oral TID AC  . multivitamin with minerals  1 tablet Oral q morning - 10a  . piperacillin-tazobactam (ZOSYN)  IV  3.375 g Intravenous 3 times per day  . saccharomyces boulardii  250 mg Oral BID  . vancomycin  1,500 mg Intravenous Q12H  . zinc sulfate  220 mg Oral q morning - 10a   Continuous Infusions: . sodium chloride    . sodium chloride 75 mL/hr at 06/24/13 1404    Principal Problem:   Complicated UTI (urinary tract infection) Active Problems:   Sacral decubitus ulcer, stage IV   Suprapubic catheter   Chronic osteomyelitis, pelvic region and thigh   UTI (lower urinary tract infection)  Time spent: 45min  Mirl Hillery, [place]  [facility] Pager [phone]. If 7PM-7AM, please contact night-coverage at [email or web address], password [facility] 06/26/2013, 6:11 PM  LOS: 5 days

## 2013-06-27 DIAGNOSIS — B9689 Other specified bacterial agents as the cause of diseases classified elsewhere: Secondary | ICD-10-CM

## 2013-06-27 LAB — CBC
HCT: 27.2 % — ABNORMAL LOW (ref 39.0–52.0)
Hemoglobin: 8.3 g/dL — ABNORMAL LOW (ref 13.0–17.0)
MCH: 23.9 pg — AB (ref 26.0–34.0)
MCHC: 30.5 g/dL (ref 30.0–36.0)
MCV: 78.2 fL (ref 78.0–100.0)
Platelets: 421 10*3/uL — ABNORMAL HIGH (ref 150–400)
RBC: 3.48 MIL/uL — AB (ref 4.22–5.81)
RDW: 16.3 % — ABNORMAL HIGH (ref 11.5–15.5)
WBC: 15.9 10*3/uL — AB (ref 4.0–10.5)

## 2013-06-27 LAB — BASIC METABOLIC PANEL
BUN: 7 mg/dL (ref 6–23)
CO2: 24 meq/L (ref 19–32)
Calcium: 8.5 mg/dL (ref 8.4–10.5)
Chloride: 103 mEq/L (ref 96–112)
Creatinine, Ser: 0.4 mg/dL — ABNORMAL LOW (ref 0.50–1.35)
GFR calc Af Amer: >90 mL/min (ref >90)
GFR calc non Af Amer: >90 mL/min (ref >90)
Glucose, Bld: 102 mg/dL — ABNORMAL HIGH (ref 70–99)
Potassium: 3.3 mEq/L — ABNORMAL LOW (ref 3.7–5.3)
Sodium: 140 mEq/L (ref 137–147)

## 2013-06-27 MED ORDER — HEPARIN SODIUM (PORCINE) 5000 UNIT/ML IJ SOLN
5000.0000 [IU] | Freq: Three times a day (TID) | INTRAMUSCULAR | Status: DC
  Administered 2013-06-28 – 2013-07-01 (×8): 5000 [IU] via SUBCUTANEOUS
  Filled 2013-06-27 (×11): qty 1

## 2013-06-27 MED ORDER — HEPARIN SODIUM (PORCINE) 5000 UNIT/ML IJ SOLN
5000.0000 [IU] | Freq: Three times a day (TID) | INTRAMUSCULAR | Status: AC
  Filled 2013-06-27 (×2): qty 1

## 2013-06-27 MED ORDER — POTASSIUM CHLORIDE CRYS ER 20 MEQ PO TBCR
40.0000 meq | EXTENDED_RELEASE_TABLET | Freq: Once | ORAL | Status: AC
  Administered 2013-06-27: 40 meq via ORAL
  Filled 2013-06-27: qty 2

## 2013-06-27 NOTE — Progress Notes (Signed)
[facility] PROGRESS NOTE  [name] [name] [id number] DOB: 05/21/1965 DOA: 06/21/2013 PCP: [name], MD   Assessment/Plan:  Complicated UTI with suprapubic cath and sepsis - ID following, appreciate input Chronic osteomyelitis - Repeated MRI this hospitalization as below. Interventional radiology consulted for consideration of drainage of the fluid collections. Appreciate input.  - PICC line in am. HTN - Continue Norvasc Quadriplegia - continue baclofen    Code Status: Full Family Communication: Pt in room  Disposition Plan: Pending  Consultants:  ID  Procedures:  MRI   Antibiotics:  Vanc 06/21/13>>>  Zosyn 06/21/13>>>2/7, resumed 2/9>>>  Primaxin 2/7>>>2/9  HPI/Subjective: No acute events noted. No complaints.  Objective: Filed Vitals:   06/26/13 0100 06/26/13 0724 06/26/13 2124 06/27/13 0007  BP:  137/84 115/69   Pulse: 99 102 92 96  Temp:  98.8 F (37.1 C) 98 F (36.7 C)   TempSrc:  Oral Oral   Resp: 16 20 20 18   Height:      Weight:      SpO2: 97% 94% 97% 97%    Intake/Output Summary (Last 24 hours) at 06/27/13 1338 Last data filed at 06/27/13 1258  Gross per 24 hour  Intake   1440 ml  Output   2600 ml  Net  -1160 ml   Filed Weights   06/21/13 2230  Weight: 102.1 kg (225 lb 1.4 oz)    Exam:  General:  Awake, in nad  Cardiovascular: regular, s1, s2  Respiratory: normal resp effort, no wheezing  Abdomen: soft, nondistended  Musculoskeletal: perfused, contractures over UE B   Data Reviewed: Basic Metabolic Panel:  Recent Labs Lab 06/21/13 2010 06/22/13 0535 06/23/13 0543 06/26/13 1615 06/27/13 0555  NA 133* 133* 137 141 140  K 4.5 6.0* 3.9 3.2* 3.3*  CL 99 101 103 104 103  CO2 23 20 21 25 24   GLUCOSE 122* 108* 108* 127* 102*  BUN 17 16 13 7 7   CREATININE 0.46* 0.47* 0.49* 0.44* 0.40*  CALCIUM 8.9 8.4 8.6 8.3* 8.5   Liver Function Tests:  Recent Labs Lab 06/21/13 2010  AST 16  ALT 17  ALKPHOS 105    BILITOT <0.2*  PROT 8.1  ALBUMIN 2.7*   No results found for this basename: LIPASE, AMYLASE,  in the last 168 hours No results found for this basename: AMMONIA,  in the last 168 hours CBC:  Recent Labs Lab 06/21/13 2010 06/22/13 0656 06/23/13 0543 06/26/13 1615 06/27/13 0555  WBC 15.5* 18.4* 16.3* 14.6* 15.9*  NEUTROABS 13.9*  --  11.5* 10.8*  --   HGB 9.2* 9.0* 9.3* 9.0* 8.3*  HCT 29.3* 28.5* 29.4* 28.4* 27.2*  MCV 76.5* 76.6* 76.6* 77.0* 78.2  PLT 374 335 349 429* 421*   Cardiac Enzymes: No results found for this basename: CKTOTAL, CKMB, CKMBINDEX, TROPONINI,  in the last 168 hours BNP (last 3 results) No results found for this basename: PROBNP,  in the last 8760 hours CBG: No results found for this basename: GLUCAP,  in the last 168 hours  Recent Results (from the past 240 hour(s))  URINE CULTURE     Status: None   Collection Time    06/21/13  6:40 PM      Result Value Ref Range Status   Specimen Description URINE, CATHETERIZED   Final   Special Requests NONE   Final   Culture  Setup Time     Final   Value: 06/22/2013 00:42     Performed at [facility]  Colony Count     Final   Value: >=100,000 COLONIES/ML     Performed at [facility]   Culture     Final   Value: PROVIDENCIA STUARTII     PSEUDOMONAS AERUGINOSA     Performed at [facility]   Report Status 06/24/2013 FINAL   Final   Organism ID, Bacteria PROVIDENCIA STUARTII   Final   Organism ID, Bacteria PSEUDOMONAS AERUGINOSA   Final  CULTURE, BLOOD (ROUTINE X 2)     Status: None   Collection Time    06/21/13  8:10 PM      Result Value Ref Range Status   Specimen Description BLOOD LEFT ARM   Final   Special Requests BOTTLES DRAWN AEROBIC AND ANAEROBIC 5CC   Final   Culture  Setup Time     Final   Value: 06/22/2013 00:32     Performed at [facility]   Culture     Final   Value:        BLOOD CULTURE RECEIVED NO GROWTH TO DATE CULTURE WILL BE HELD FOR 5 DAYS BEFORE  ISSUING A FINAL NEGATIVE REPORT     Performed at [facility]   Report Status PENDING   Incomplete     Studies: Mr Pelvis W Wo Contrast  06/26/2013   CLINICAL DATA:  Chronic pelvic osteomyelitis. Sacral decubitus ulcer.  EXAM: MRI PELVIS WITHOUT AND WITH CONTRAST  TECHNIQUE: Multiplanar multisequence MR imaging of the pelvis was performed both before and after administration of intravenous contrast.  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 There is a peripherally enhancing small fluid collection measuring 4.5 x 1.3 cm superior to the femoral head. There is a large overlying decubitus ulcer with enhancement at the margin of the ulcer.  There is bone destruction of the left hip. There is no significant osseous signal abnormality. There is soft tissue enhancement within the left hip joint space. There is a peripherally enhancing fluid collection posterior to the hip measuring 6 x 1.3 cm likely representing a small abscess. There is a overlying skin defect posterior to the ischium.  There is a sacral decubitus ulcer overlying the left SI joint with mild marrow edema in the posterior sacrum and ilium with mild enhancement concerning for osteomyelitis. There is no adjacent fluid collection.  There is a suprapubic catheter within the bladder.  IMPRESSION: 1. There are chronic destructive osseous changes of bilateral hips with irregular soft tissue enhancement within bilateral hip joints and adjacent fluid collections. There is no significant marrow signal abnormality. The overall appearance is consistent with chronic osteomyelitis  with the fluid collections concerning for abscesses. Overall there is no significant changes in the appearance of bilateral hips compared with the CT of the pelvis performed on 02/27/2013. 2. There is a sacral decubitus ulcer overlying the left SI joint with mild marrow edema of the posterior left sacrum and ilium with enhancement concerning for osteomyelitis.   Electronically Signed   By: Kathreen Devoid   On: 06/26/2013 12:39    Scheduled Meds: . amLODipine  5 mg Oral q morning - 10a  . baclofen  20  mg Oral QID  . docusate sodium  100 mg Oral BID  . famotidine  20 mg Oral BID  . furosemide  20 mg Oral BID  . heparin  5,000 Units Subcutaneous 3 times per day  . [START ON 06/28/2013] heparin subcutaneous  5,000 Units Subcutaneous 3 times per day  . metoCLOPramide  10 mg Oral TID AC  . multivitamin with minerals  1 tablet Oral q morning - 10a  . piperacillin-tazobactam (ZOSYN)  IV  3.375 g Intravenous 3 times per day  . saccharomyces boulardii  250 mg Oral BID  . vancomycin  1,500 mg Intravenous Q12H  . zinc sulfate  220 mg Oral q morning - 10a   Continuous Infusions: . sodium chloride    . sodium chloride 75 mL/hr at 06/24/13 1404    Principal Problem:   Complicated UTI (urinary tract infection) Active Problems:   Sacral decubitus ulcer, stage IV   Suprapubic catheter   Chronic osteomyelitis, pelvic region and thigh   UTI (lower urinary tract infection)  Time spent: 14min  Jammy Stlouis, [place]  [facility] Pager [phone]. If 7PM-7AM, please contact night-coverage at [email or web address], password [facility] 06/27/2013, 1:38 PM  LOS: 6 days

## 2013-06-27 NOTE — Progress Notes (Signed)
Agree.  Will try to aspirate some fluid adjacent to proximal right femur under CT guidance.

## 2013-06-27 NOTE — Progress Notes (Addendum)
INFECTIOUS DISEASE PROGRESS NOTE  ID: [name] [name] is a 48 y.o. male with  Principal Problem:   Complicated UTI (urinary tract infection) Active Problems:   Sacral decubitus ulcer, stage IV   Suprapubic catheter   Chronic osteomyelitis, pelvic region and thigh   UTI (lower urinary tract infection)  Subjective: Without complaints  24hr: underwent mri today Abtx:  Anti-infectives   Start     Dose/Rate Route Frequency Ordered Stop   06/25/13 0600  vancomycin (VANCOCIN) 1,500 mg in sodium chloride 0.9 % 500 mL IVPB     1,500 mg 250 mL/hr over 120 Minutes Intravenous Every 12 hours 06/24/13 1839     06/24/13 1930  vancomycin (VANCOCIN) 500 mg in sodium chloride 0.9 % 100 mL IVPB     500 mg 100 mL/hr over 60 Minutes Intravenous  Once 06/24/13 1839 06/24/13 2112   06/24/13 1400  piperacillin-tazobactam (ZOSYN) IVPB 3.375 g     3.375 g 12.5 mL/hr over 240 Minutes Intravenous 3 times per day 06/24/13 1236     06/22/13 1800  imipenem-cilastatin (PRIMAXIN) 500 mg in sodium chloride 0.9 % 100 mL IVPB  Status:  Discontinued     500 mg 200 mL/hr over 30 Minutes Intravenous 4 times per day 06/22/13 1743 06/24/13 1225   06/22/13 0600  vancomycin (VANCOCIN) IVPB 1000 mg/200 mL premix  Status:  Discontinued     1,000 mg 200 mL/hr over 60 Minutes Intravenous Every 12 hours 06/22/13 0308 06/24/13 1838   06/22/13 0600  piperacillin-tazobactam (ZOSYN) IVPB 3.375 g  Status:  Discontinued     3.375 g 12.5 mL/hr over 240 Minutes Intravenous 3 times per day 06/22/13 0308 06/22/13 1743   06/21/13 2200  ceFEPIme (MAXIPIME) 1 g in dextrose 5 % 50 mL IVPB  Status:  Discontinued     1 g 100 mL/hr over 30 Minutes Intravenous Every 12 hours 06/21/13 2158 06/21/13 2204   06/21/13 2100  vancomycin (VANCOCIN) IVPB 1000 mg/200 mL premix     1,000 mg 200 mL/hr over 60 Minutes Intravenous  Once 06/21/13 2022 06/26/13 0919   06/21/13 2045  piperacillin-tazobactam (ZOSYN) IVPB 3.375 g     3.375 g 100 mL/hr  over 30 Minutes Intravenous  Once 06/21/13 2022 06/21/13 2128      Medications:  Scheduled: . amLODipine  5 mg Oral q morning - 10a  . baclofen  20 mg Oral QID  . docusate sodium  100 mg Oral BID  . famotidine  20 mg Oral BID  . furosemide  20 mg Oral BID  . heparin  5,000 Units Subcutaneous 3 times per day  . [START ON 06/28/2013] heparin subcutaneous  5,000 Units Subcutaneous 3 times per day  . metoCLOPramide  10 mg Oral TID AC  . multivitamin with minerals  1 tablet Oral q morning - 10a  . piperacillin-tazobactam (ZOSYN)  IV  3.375 g Intravenous 3 times per day  . saccharomyces boulardii  250 mg Oral BID  . vancomycin  1,500 mg Intravenous Q12H  . zinc sulfate  220 mg Oral q morning - 10a    Objective: Vital signs in last 24 hours: Temp:  [97.5 F (36.4 C)-98 F (36.7 C)] 97.5 F (36.4 C) (02/12 1602) Pulse Rate:  [57-96] 57 (02/12 1411) Resp:  [18-20] 18 (02/12 1411) BP: (115-151)/(69-87) 151/87 mmHg (02/12 1411) SpO2:  [97 %-98 %] 98 % (02/12 1411)   General appearance: alert, cooperative and no distress Resp: clear to auscultation bilaterally Cardio: regular rate and rhythm  GI: normal findings: bowel sounds normal and soft, non-tender  Lab Results  Recent Labs  06/26/13 1615 06/27/13 0555  WBC 14.6* 15.9*  HGB 9.0* 8.3*  HCT 28.4* 27.2*  NA 141 140  K 3.2* 3.3*  CL 104 103  CO2 25 24  BUN 7 7  CREATININE 0.44* 0.40*   Liver Panel No results found for this basename: PROT, ALBUMIN, AST, ALT, ALKPHOS, BILITOT, BILIDIR, IBILI,  in the last 72 hours  Microbiology: Recent Results (from the past 240 hour(s))  URINE CULTURE     Status: None   Collection Time    06/21/13  6:40 PM      Result Value Ref Range Status   Specimen Description URINE, CATHETERIZED   Final   Special Requests NONE   Final   Culture  Setup Time     Final   Value: 06/22/2013 00:42     Performed at [facility]     Final   Value: >=100,000 COLONIES/ML      Performed at [facility]   Culture     Final   Value: PROVIDENCIA STUARTII     PSEUDOMONAS AERUGINOSA     Performed at [facility]   Report Status 06/24/2013 FINAL   Final   Organism ID, Bacteria PROVIDENCIA STUARTII   Final   Organism ID, Bacteria PSEUDOMONAS AERUGINOSA   Final  CULTURE, BLOOD (ROUTINE X 2)     Status: None   Collection Time    06/21/13  8:10 PM      Result Value Ref Range Status   Specimen Description BLOOD LEFT ARM   Final   Special Requests BOTTLES DRAWN AEROBIC AND ANAEROBIC 5CC   Final   Culture  Setup Time     Final   Value: 06/22/2013 00:32     Performed at [facility]   Culture     Final   Value:        BLOOD CULTURE RECEIVED NO GROWTH TO DATE CULTURE WILL BE HELD FOR 5 DAYS BEFORE ISSUING A FINAL NEGATIVE REPORT     Performed at [facility]   Report Status PENDING   Incomplete   PsA: R imi, but S piptazo Provedencia: S piptazo  Imaging: MRI pelvis: There is 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 There is a peripherally enhancing small  fluid collection measuring 4.5 x 1.3 cm superior to the femoral  head. There is a large overlying decubitus ulcer with enhancement at  the margin of the ulcer.  There is bone destruction of the left hip. There is no significant  osseous signal abnormality. There is soft tissue enhancement within  the left hip joint space. There is a peripherally enhancing fluid  collection posterior to the hip measuring 6 x 1.3 cm likely  representing a small abscess. There is a overlying skin defect  posterior to the ischium.  There is a sacral decubitus ulcer overlying the left SI joint with  mild marrow edema in the posterior sacrum and ilium  with mild  enhancement concerning for osteomyelitis. There is no adjacent fluid  collection.  There is a suprapubic catheter within the bladder.    Assessment/Plan: Fever  Prev UTI  Complicated  decubitus ulcer, chronic, with pelvic osteo and fluid collections concerning for abscess  1) psa and provendentia uti : continue on piptazo due to sensitivities on urine culture  2)chronic osteo of pelvis with abscess: MRI of pelvis shows chronic osteo but also small fluid collection. Appreciate IR drainage of fluid collection. Please send for culture. Will need PICC line to treat for 4-6 wk of IV antibiotics since chronic osteo with abscess and elevated inflammatory markers         Benaiah Behan Infectious Diseases (pager) [phone] (cell) [phone] [email or web address] 06/27/2013, 5:13 PM  LOS: 6 days
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
## 2013-06-28 ENCOUNTER — Inpatient Hospital Stay (HOSPITAL_COMMUNITY): Payer: Medicare Other

## 2013-06-28 LAB — PROTIME-INR
INR: 1.05 (ref 0.00–1.49)
PROTHROMBIN TIME: 13.5 s (ref 11.6–15.2)

## 2013-06-28 LAB — CBC
HEMATOCRIT: 27.4 % — AB (ref 39.0–52.0)
HEMOGLOBIN: 8.2 g/dL — AB (ref 13.0–17.0)
MCH: 23.4 pg — AB (ref 26.0–34.0)
MCHC: 29.9 g/dL — ABNORMAL LOW (ref 30.0–36.0)
MCV: 78.3 fL (ref 78.0–100.0)
Platelets: 439 10*3/uL — ABNORMAL HIGH (ref 150–400)
RBC: 3.5 MIL/uL — AB (ref 4.22–5.81)
RDW: 16.3 % — ABNORMAL HIGH (ref 11.5–15.5)
WBC: 16.6 10*3/uL — ABNORMAL HIGH (ref 4.0–10.5)

## 2013-06-28 LAB — CULTURE, BLOOD (ROUTINE X 2): CULTURE: NO GROWTH

## 2013-06-28 LAB — VANCOMYCIN, TROUGH: Vancomycin Tr: 17.2 ug/mL (ref 10.0–20.0)

## 2013-06-28 LAB — BASIC METABOLIC PANEL
BUN: 9 mg/dL (ref 6–23)
CALCIUM: 8.8 mg/dL (ref 8.4–10.5)
CO2: 26 meq/L (ref 19–32)
Chloride: 103 mEq/L (ref 96–112)
Creatinine, Ser: 0.46 mg/dL — ABNORMAL LOW (ref 0.50–1.35)
GFR calc Af Amer: >90 mL/min (ref >90)
GFR calc non Af Amer: >90 mL/min (ref >90)
Glucose, Bld: 95 mg/dL (ref 70–99)
POTASSIUM: 3.3 meq/L — AB (ref 3.7–5.3)
SODIUM: 140 meq/L (ref 137–147)

## 2013-06-28 LAB — APTT: aPTT: 28 seconds (ref 24–37)

## 2013-06-28 MED ORDER — POTASSIUM CHLORIDE 20 MEQ/15ML (10%) PO LIQD
40.0000 meq | Freq: Two times a day (BID) | ORAL | Status: AC
  Administered 2013-06-28: 40 meq via ORAL
  Filled 2013-06-28 (×2): qty 30

## 2013-06-28 MED ORDER — MIDAZOLAM HCL 2 MG/2ML IJ SOLN
INTRAMUSCULAR | Status: AC | PRN
  Administered 2013-06-28: 0.5 mg via INTRAVENOUS

## 2013-06-28 MED ORDER — FENTANYL CITRATE 0.05 MG/ML IJ SOLN
INTRAMUSCULAR | Status: AC
  Filled 2013-06-28: qty 6

## 2013-06-28 MED ORDER — FENTANYL CITRATE 0.05 MG/ML IJ SOLN
INTRAMUSCULAR | Status: AC | PRN
  Administered 2013-06-28: 50 ug via INTRAVENOUS

## 2013-06-28 MED ORDER — MIDAZOLAM HCL 2 MG/2ML IJ SOLN
INTRAMUSCULAR | Status: AC
  Filled 2013-06-28: qty 6
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
## 2013-06-28 NOTE — Progress Notes (Signed)
[facility] PROGRESS NOTE  [name] [name] [id number]:[id number] DOB: 08-12-1965 DOA: 06/21/2013 PCP: [name], MD   Assessment/Plan:  Complicated UTI with suprapubic cath and sepsis - ID following, appreciate input Chronic osteomyelitis - Repeated MRI this hospitalization as below. Interventional radiology consulted for consideration of drainage of the fluid collections. Appreciate input.  - plan for IR drainage today, send for cultures, stain - will order PICC line later today not to interfere with IR procedure.  HTN - Continue Norvasc Quadriplegia - continue baclofen Anemia - monitor CBC, suspect underlying chronic disease contributing.  Leukocytosis - due to chronic osteomyelitis.  Obesity   Code Status: Full Family Communication: Pt in room  Disposition Plan: Pending  Consultants:  ID  Procedures:  MRI   Antibiotics:  Vanc 06/21/13>>>  Zosyn 06/21/13>>>2/7, resumed 2/9>>>  Primaxin 2/7>>>2/9  HPI/Subjective: No acute events noted. No complaints.  Objective: Filed Vitals:   06/27/13 1411 06/27/13 1602 06/27/13 2102 06/27/13 2332  BP: 151/87  101/60   Pulse: 57  89 75  Temp: 97.5 F (36.4 C) 97.5 F (36.4 C) 98.1 F (36.7 C)   TempSrc: Oral  Oral   Resp: 18  18 17   Height:      Weight:      SpO2: 98%  98% 97%    Intake/Output Summary (Last 24 hours) at 06/28/13 0754 Last data filed at 06/27/13 2337  Gross per 24 hour  Intake   1320 ml  Output   2850 ml  Net  -1530 ml   Filed Weights   06/21/13 2230  Weight: 102.1 kg (225 lb 1.4 oz)    Exam:  General:  Awake, in nad  Cardiovascular: regular, s1, s2  Respiratory: normal resp effort, no wheezing  Abdomen: soft, nondistended  Musculoskeletal: perfused, contractures over UE B   Data Reviewed: Basic Metabolic Panel:  Recent Labs Lab 06/22/13 0535 06/23/13 0543 06/26/13 1615 06/27/13 0555 06/28/13 0600  NA 133* 137 141 140 140  K 6.0* 3.9 3.2* 3.3* 3.3*  CL 101 103 104 103  103  CO2 20 21 25 24 26   GLUCOSE 108* 108* 127* 102* 95  BUN 16 13 7 7 9   CREATININE 0.47* 0.49* 0.44* 0.40* 0.46*  CALCIUM 8.4 8.6 8.3* 8.5 8.8   Liver Function Tests:  Recent Labs Lab 06/21/13 2010  AST 16  ALT 17  ALKPHOS 105  BILITOT <0.2*  PROT 8.1  ALBUMIN 2.7*   No results found for this basename: LIPASE, AMYLASE,  in the last 168 hours No results found for this basename: AMMONIA,  in the last 168 hours CBC:  Recent Labs Lab 06/21/13 2010 06/22/13 0656 06/23/13 0543 06/26/13 1615 06/27/13 0555 06/28/13 0600  WBC 15.5* 18.4* 16.3* 14.6* 15.9* 16.6*  NEUTROABS 13.9*  --  11.5* 10.8*  --   --   HGB 9.2* 9.0* 9.3* 9.0* 8.3* 8.2*  HCT 29.3* 28.5* 29.4* 28.4* 27.2* 27.4*  MCV 76.5* 76.6* 76.6* 77.0* 78.2 78.3  PLT 374 335 349 429* 421* 439*   Cardiac Enzymes: No results found for this basename: CKTOTAL, CKMB, CKMBINDEX, TROPONINI,  in the last 168 hours BNP (last 3 results) No results found for this basename: PROBNP,  in the last 8760 hours CBG: No results found for this basename: GLUCAP,  in the last 168 hours  Recent Results (from the past 240 hour(s))  URINE CULTURE     Status: None   Collection Time    06/21/13  6:40 PM  Result Value Ref Range Status   Specimen Description URINE, CATHETERIZED   Final   Special Requests NONE   Final   Culture  Setup Time     Final   Value: 06/22/2013 00:42     Performed at [place]     Final   Value: >=100,000 COLONIES/ML     Performed at [facility]   Culture     Final   Value: PROVIDENCIA STUARTII     PSEUDOMONAS AERUGINOSA     Performed at [facility]   Report Status 06/24/2013 FINAL   Final   Organism ID, Bacteria PROVIDENCIA STUARTII   Final   Organism ID, Bacteria PSEUDOMONAS AERUGINOSA   Final  CULTURE, BLOOD (ROUTINE X 2)     Status: None   Collection Time    06/21/13  8:10 PM      Result Value Ref Range Status   Specimen Description BLOOD LEFT ARM    Final   Special Requests BOTTLES DRAWN AEROBIC AND ANAEROBIC 5CC   Final   Culture  Setup Time     Final   Value: 06/22/2013 00:32     Performed at [facility]   Culture     Final   Value:        BLOOD CULTURE RECEIVED NO GROWTH TO DATE CULTURE WILL BE HELD FOR 5 DAYS BEFORE ISSUING A FINAL NEGATIVE REPORT     Performed at [facility]   Report Status PENDING   Incomplete     Studies: Mr Pelvis W Wo Contrast  06/26/2013   CLINICAL DATA:  Chronic pelvic osteomyelitis. Sacral decubitus ulcer.  EXAM: MRI PELVIS WITHOUT AND WITH CONTRAST  TECHNIQUE: Multiplanar multisequence MR imaging of the pelvis was performed both before and after administration of intravenous contrast.  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 There is a peripherally enhancing small fluid collection measuring 4.5 x 1.3 cm superior to the femoral head. There is a large overlying decubitus ulcer with enhancement at the margin of the ulcer.  There is bone destruction of the left hip. There is no significant osseous signal abnormality. There is soft tissue enhancement within the left hip joint space. There is a peripherally enhancing fluid collection posterior to the hip measuring 6 x 1.3 cm likely representing a small abscess. There is a overlying skin defect posterior to the ischium.  There is a sacral decubitus ulcer overlying the left SI joint with mild marrow edema in the posterior sacrum and ilium with mild enhancement concerning for osteomyelitis. There is no adjacent fluid collection.  There is a suprapubic catheter within the bladder.   IMPRESSION: 1. There are chronic destructive osseous changes of bilateral hips with irregular soft tissue enhancement within bilateral hip joints and adjacent fluid collections. There is no significant marrow signal abnormality. The overall appearance is consistent with chronic osteomyelitis with the fluid collections concerning for abscesses. Overall there is no significant changes in the appearance of bilateral hips compared with the CT of the pelvis performed on 02/27/2013. 2. There is a sacral decubitus ulcer overlying the left SI joint with mild  marrow edema of the posterior left sacrum and ilium with enhancement concerning for osteomyelitis.   Electronically Signed   By: Kathreen Devoid   On: 06/26/2013 12:39    Scheduled Meds: . amLODipine  5 mg Oral q morning - 10a  . baclofen  20 mg Oral QID  . docusate sodium  100 mg Oral BID  . famotidine  20 mg Oral BID  . furosemide  20 mg Oral BID  . heparin subcutaneous  5,000 Units Subcutaneous 3 times per day  . metoCLOPramide  10 mg Oral TID AC  . multivitamin with minerals  1 tablet Oral q morning - 10a  . piperacillin-tazobactam (ZOSYN)  IV  3.375 g Intravenous 3 times per day  . potassium chloride  40 mEq Oral BID  . saccharomyces boulardii  250 mg Oral BID  . vancomycin  1,500 mg Intravenous Q12H  . zinc sulfate  220 mg Oral q morning - 10a   Continuous Infusions: . sodium chloride    . sodium chloride 75 mL/hr at 06/24/13 1404   Principal Problem:   Complicated UTI (urinary tract infection) Active Problems:   Obesity   Quadriplegia   Leukocytosis   Sacral decubitus ulcer, stage IV   Suprapubic catheter   Anemia   Chronic osteomyelitis, pelvic region and thigh   UTI (lower urinary tract infection)  Time spent: 25 min  [place], [place] [facility] Pager [phone]. If 7PM-7AM, please contact night-coverage at [email or web address], password [facility] 06/28/2013, 7:54 AM  LOS: 7 days

## 2013-06-28 NOTE — Progress Notes (Signed)
Pt will need IV antibiotics at home. [facility] has been made aware.  Allene Dillon RN BSN  [phone]

## 2013-06-28 NOTE — Procedures (Signed)
Procedure:  CT guided right hip aspiration Findings:  CT guided 18 G needle aspiration at level of destroyed bone of right hip joint yielded only small amount of bloody fluid. Sample sent for culture studies.

## 2013-06-28 NOTE — Progress Notes (Signed)
ANTIBIOTIC CONSULT NOTE - FOLLOW UP  Pharmacy Consult for Vancomycin Indication: osteomyelitis, decubitus ulcer, uti   Allergies  Allergen Reactions  . Ditropan [Oxybutynin] Other (See Comments)    hallucinations    Patient Measurements: Height: 6' (182.9 cm) Weight: 225 lb 1.4 oz (102.1 kg) IBW/kg (Calculated) : 77.6  Vital Signs: Temp: 98.1 F (36.7 C) (02/12 2102) Temp src: Oral (02/12 2102) BP: 101/60 mmHg (02/12 2102) Pulse Rate: 75 (02/12 2332) Intake/Output from previous day: 02/12 0701 - 02/13 0700 In: 1320 [P.O.:1320] Out: 4050 [Urine:4050] Intake/Output from this shift: Total I/O In: -  Out: 1600 [Urine:1600]  Labs:  Recent Labs  06/26/13 1615 06/27/13 0555 06/28/13 0600  WBC 14.6* 15.9* 16.6*  HGB 9.0* 8.3* 8.2*  PLT 429* 421* 439*  CREATININE 0.44* 0.40* 0.46*   Estimated Creatinine Clearance: 142.6 ml/min (by C-G formula based on Cr of 0.46).  Recent Labs  06/28/13 0600  VANCOTROUGH 17.2     Microbiology: Recent Results (from the past 720 hour(s))  URINE CULTURE     Status: None   Collection Time    06/21/13  6:40 PM      Result Value Ref Range Status   Specimen Description URINE, CATHETERIZED   Final   Special Requests NONE   Final   Culture  Setup Time     Final   Value: 06/22/2013 00:42     Performed at [place]     Final   Value: >=100,000 COLONIES/ML     Performed at [facility]   Culture     Final   Value: PROVIDENCIA STUARTII     PSEUDOMONAS AERUGINOSA     Performed at [facility]   Report Status 06/24/2013 FINAL   Final   Organism ID, Bacteria PROVIDENCIA STUARTII   Final   Organism ID, Bacteria PSEUDOMONAS AERUGINOSA   Final  CULTURE, BLOOD (ROUTINE X 2)     Status: None   Collection Time    06/21/13  8:10 PM      Result Value Ref Range Status   Specimen Description BLOOD LEFT ARM   Final   Special Requests BOTTLES DRAWN AEROBIC AND ANAEROBIC 5CC   Final   Culture  Setup  Time     Final   Value: 06/22/2013 00:32     Performed at [facility]   Culture     Final   Value:        BLOOD CULTURE RECEIVED NO GROWTH TO DATE CULTURE WILL BE HELD FOR 5 DAYS BEFORE ISSUING A FINAL NEGATIVE REPORT     Performed at [facility]   Report Status PENDING   Incomplete    Anti-infectives   Start     Dose/Rate Route Frequency Ordered Stop   06/25/13 0600  vancomycin (VANCOCIN) 1,500 mg in sodium chloride 0.9 % 500 mL IVPB     1,500 mg 250 mL/hr over 120 Minutes Intravenous Every 12 hours 06/24/13 1839     06/24/13 1930  vancomycin (VANCOCIN) 500 mg in sodium chloride 0.9 % 100 mL IVPB     500 mg 100 mL/hr over 60 Minutes Intravenous  Once 06/24/13 1839 06/24/13 2112   06/24/13 1400  piperacillin-tazobactam (ZOSYN) IVPB 3.375 g     3.375 g 12.5 mL/hr over 240 Minutes Intravenous 3 times per day 06/24/13 1236     06/22/13 1800  imipenem-cilastatin (PRIMAXIN) 500 mg in sodium chloride 0.9 % 100 mL IVPB  Status:  Discontinued  500 mg 200 mL/hr over 30 Minutes Intravenous 4 times per day 06/22/13 1743 06/24/13 1225   06/22/13 0600  vancomycin (VANCOCIN) IVPB 1000 mg/200 mL premix  Status:  Discontinued     1,000 mg 200 mL/hr over 60 Minutes Intravenous Every 12 hours 06/22/13 0308 06/24/13 1838   06/22/13 0600  piperacillin-tazobactam (ZOSYN) IVPB 3.375 g  Status:  Discontinued     3.375 g 12.5 mL/hr over 240 Minutes Intravenous 3 times per day 06/22/13 0308 06/22/13 1743   06/21/13 2200  ceFEPIme (MAXIPIME) 1 g in dextrose 5 % 50 mL IVPB  Status:  Discontinued     1 g 100 mL/hr over 30 Minutes Intravenous Every 12 hours 06/21/13 2158 06/21/13 2204   06/21/13 2100  vancomycin (VANCOCIN) IVPB 1000 mg/200 mL premix     1,000 mg 200 mL/hr over 60 Minutes Intravenous  Once 06/21/13 2022 06/26/13 0919   06/21/13 2045  piperacillin-tazobactam (ZOSYN) IVPB 3.375 g     3.375 g 100 mL/hr over 30 Minutes Intravenous  Once 06/21/13 2022 06/21/13 2128       Assessment:  48 yo M with hx of complicated UTI, Sacral decubitus ulcer (stage IV), suprapubic catheter, chronic OM through pelvic region and thigh.   Patient is on D#3 Vancomycin, D#3 Primaxin.  Urine culture today shows > 100k  Providencia and Psuedomonas, Both sensitive to Zosyn, will change to zosyn per pharmacy.  ID following.   2/7 >> Vanc >> 2/7 >> Zosyn >> 2/7 2/7 >> Primaxin >> 2/9 2/9 >> Zosyn >>   Micro 2/6 Blood x 1: NGTD 2.6 Urine: > 100k Pseudomonas (Sens to AG, 4th generation ceph, Zosyn)/Providencia (Sensitive to zosyn/rocephin)  Goal of Therapy:  Vancomycin Trough 15-20   eradication of infection   Plan:    Continue Vancomycin 1500 Gm IV q12h  F/U SCr/levels/cultures as needed   Dorrene German PharmD 6:47 AM 06/28/2013

## 2013-06-29 DIAGNOSIS — M009 Pyogenic arthritis, unspecified: Secondary | ICD-10-CM

## 2013-06-29 LAB — CBC
HCT: 28.1 % — ABNORMAL LOW (ref 39.0–52.0)
Hemoglobin: 8.5 g/dL — ABNORMAL LOW (ref 13.0–17.0)
MCH: 23.5 pg — ABNORMAL LOW (ref 26.0–34.0)
MCHC: 30.2 g/dL (ref 30.0–36.0)
MCV: 77.6 fL — ABNORMAL LOW (ref 78.0–100.0)
Platelets: 489 10*3/uL — ABNORMAL HIGH (ref 150–400)
RBC: 3.62 MIL/uL — ABNORMAL LOW (ref 4.22–5.81)
RDW: 16.6 % — ABNORMAL HIGH (ref 11.5–15.5)
WBC: 9.1 10*3/uL (ref 4.0–10.5)

## 2013-06-29 LAB — BASIC METABOLIC PANEL
BUN: 5 mg/dL — ABNORMAL LOW (ref 6–23)
CHLORIDE: 104 meq/L (ref 96–112)
CO2: 26 mEq/L (ref 19–32)
Calcium: 9.2 mg/dL (ref 8.4–10.5)
Creatinine, Ser: 0.36 mg/dL — ABNORMAL LOW (ref 0.50–1.35)
GFR calc Af Amer: >90 mL/min (ref >90)
GFR calc non Af Amer: >90 mL/min (ref >90)
GLUCOSE: 95 mg/dL (ref 70–99)
POTASSIUM: 3.2 meq/L — AB (ref 3.7–5.3)
SODIUM: 143 meq/L (ref 137–147)

## 2013-06-29 MED ORDER — POTASSIUM CHLORIDE CRYS ER 20 MEQ PO TBCR
30.0000 meq | EXTENDED_RELEASE_TABLET | Freq: Every day | ORAL | Status: DC
  Administered 2013-06-29: 30 meq via ORAL
  Filled 2013-06-29 (×2): qty 1

## 2013-06-29 NOTE — Progress Notes (Signed)
[facility] PROGRESS NOTE  [name] [name] QZE:[id number] DOB: 1965/12/15 DOA: 06/21/2013 PCP: [name], MD   Assessment/Plan:  Complicated UTI with suprapubic cath and sepsis - ID following, appreciate input Chronic osteomyelitis - Repeated MRI this hospitalization as below.  - IR abscess drained 2/13, send for cultures, stain; pending, no organisms; leukocytosis resolved after drainage.  - PICC line placement pending.  HTN - Continue Norvasc Quadriplegia - continue baclofen Anemia - monitor CBC, suspect underlying chronic disease contributing.  Leukocytosis - due to chronic osteomyelitis.  Obesity   Code Status: Full Family Communication: Pt in room  Disposition Plan: Pending, once PICC line in and have Abx plan per ID can be discharged.   Consultants:  ID  Procedures:  MRI   Antibiotics:  Vanc 06/21/13>>>  Zosyn 06/21/13>>>2/7, resumed 2/9>>>  Primaxin 2/7>>>2/9  HPI/Subjective: No acute events noted. No complaints.  Objective: Filed Vitals:   06/28/13 1634 06/28/13 1700 06/28/13 2215 06/29/13 0651  BP: 128/90 131/78 136/81 137/79  Pulse: 66 80 83 80  Temp: 98.3 F (36.8 C) 98.5 F (36.9 C) 98.3 F (36.8 C) 97.7 F (36.5 C)  TempSrc: Oral Oral Oral Oral  Resp: 18 16 20 18   Height:      Weight:      SpO2: 100% 97% 98% 99%    Intake/Output Summary (Last 24 hours) at 06/29/13 0934 Last data filed at 06/29/13 2263  Gross per 24 hour  Intake   1550 ml  Output   2200 ml  Net   -650 ml   Filed Weights   06/21/13 2230  Weight: 102.1 kg (225 lb 1.4 oz)    Exam:  General:  Awake, in nad  Cardiovascular: regular, s1, s2  Respiratory: normal resp effort, no wheezing  Abdomen: soft, nondistended  Musculoskeletal: perfused, contractures over UE B   Data Reviewed: Basic Metabolic Panel:  Recent Labs Lab 06/23/13 0543 06/26/13 1615 06/27/13 0555 06/28/13 0600  NA 137 141 140 140  K 3.9 3.2* 3.3* 3.3*  CL 103 104 103 103  CO2  21 25 24 26   GLUCOSE 108* 127* 102* 95  BUN 13 7 7 9   CREATININE 0.49* 0.44* 0.40* 0.46*  CALCIUM 8.6 8.3* 8.5 8.8    CBC:  Recent Labs Lab 06/23/13 0543 06/26/13 1615 06/27/13 0555 06/28/13 0600 06/29/13 0828  WBC 16.3* 14.6* 15.9* 16.6* 9.1  NEUTROABS 11.5* 10.8*  --   --   --   HGB 9.3* 9.0* 8.3* 8.2* 8.5*  HCT 29.4* 28.4* 27.2* 27.4* 28.1*  MCV 76.6* 77.0* 78.2 78.3 77.6*  PLT 349 429* 421* 439* 489*     Recent Results (from the past 240 hour(s))  URINE CULTURE     Status: None   Collection Time    06/21/13  6:40 PM      Result Value Ref Range Status   Specimen Description URINE, CATHETERIZED   Final   Special Requests NONE   Final   Culture  Setup Time     Final   Value: 06/22/2013 00:42     Performed at [place]     Final   Value: >=100,000 COLONIES/ML     Performed at [facility]   Culture     Final   Value: PROVIDENCIA STUARTII     PSEUDOMONAS AERUGINOSA     Performed at [facility]   Report Status 06/24/2013 FINAL   Final   Organism ID, Bacteria PROVIDENCIA STUARTII  Final   Organism ID, Bacteria PSEUDOMONAS AERUGINOSA   Final  CULTURE, BLOOD (ROUTINE X 2)     Status: None   Collection Time    06/21/13  8:10 PM      Result Value Ref Range Status   Specimen Description BLOOD LEFT ARM   Final   Special Requests BOTTLES DRAWN AEROBIC AND ANAEROBIC 5CC   Final   Culture  Setup Time     Final   Value: 06/22/2013 00:32     Performed at [facility]   Culture     Final   Value: NO GROWTH 5 DAYS     Performed at [facility]   Report Status 06/28/2013 FINAL   Final  CULTURE, ROUTINE-ABSCESS     Status: None   Collection Time    06/28/13  3:23 PM      Result Value Ref Range Status   Specimen Description HIP RIGHT HIP JOINT ASPIRATION   Final   Special Requests Normal   Final   Gram Stain     Final   Value: NO WBC SEEN     NO SQUAMOUS EPITHELIAL CELLS SEEN     NO ORGANISMS SEEN      Performed at [facility]   Culture PENDING   Incomplete   Report Status PENDING   Incomplete  ANAEROBIC CULTURE     Status: None   Collection Time    06/28/13  3:23 PM      Result Value Ref Range Status   Specimen Description OTHER RIGHT HIP JOINT ASPIRATION   Final   Special Requests Normal   Final   Gram Stain     Final   Value: NO WBC SEEN     NO SQUAMOUS EPITHELIAL CELLS SEEN     NO ORGANISMS SEEN     Performed at [facility]   Culture PENDING   Incomplete   Report Status PENDING   Incomplete     Studies: No results found.  Scheduled Meds: . amLODipine  5 mg Oral q morning - 10a  . baclofen  20 mg Oral QID  . docusate sodium  100 mg Oral BID  . famotidine  20 mg Oral BID  . furosemide  20 mg Oral BID  . heparin subcutaneous  5,000 Units Subcutaneous 3 times per day  . metoCLOPramide  10 mg Oral TID AC  . multivitamin with minerals  1 tablet Oral q morning - 10a  . piperacillin-tazobactam (ZOSYN)  IV  3.375 g Intravenous 3 times per day  . potassium chloride  40 mEq Oral BID  . saccharomyces boulardii  250 mg Oral BID  . vancomycin  1,500 mg Intravenous Q12H  . zinc sulfate  220 mg Oral q morning - 10a   Continuous Infusions: . sodium chloride    . sodium chloride 75 mL/hr at 06/29/13 0510   Principal Problem:   Complicated UTI (urinary tract infection) Active Problems:   Obesity   Quadriplegia   Leukocytosis   Sacral decubitus ulcer, stage IV   Suprapubic catheter   Anemia   Chronic osteomyelitis, pelvic region and thigh   UTI (lower urinary tract infection)  Time spent: 25 min  [place], [place] [facility] Pager [phone]. If 7PM-7AM, please contact night-coverage at [email or web address], password [facility] 06/29/2013, 9:34 AM  LOS: 8 days

## 2013-06-29 NOTE — Progress Notes (Signed)
RT placed patient on CPAP per order and home regimine. Water chamber filled to give humidity. Patient tolerating well at this time. RT will continue to monitor.

## 2013-06-29 NOTE — Progress Notes (Signed)
Patient ID: [name] [name], male   DOB: 02/06/1966, 48 y.o.   MRN: [id number]         [facility]    Date of Admission:  06/21/2013           Day 9 vancomycin        Day 9 piperacillin tazobactam Principal Problem:   Complicated UTI (urinary tract infection) Active Problems:   Sacral decubitus ulcer, stage IV   Obesity   Quadriplegia   Leukocytosis   Suprapubic catheter   Anemia   Chronic osteomyelitis, pelvic region and thigh   UTI (lower urinary tract infection)   . amLODipine  5 mg Oral q morning - 10a  . baclofen  20 mg Oral QID  . docusate sodium  100 mg Oral BID  . famotidine  20 mg Oral BID  . furosemide  20 mg Oral BID  . heparin subcutaneous  5,000 Units Subcutaneous 3 times per day  . metoCLOPramide  10 mg Oral TID AC  . multivitamin with minerals  1 tablet Oral q morning - 10a  . piperacillin-tazobactam (ZOSYN)  IV  3.375 g Intravenous 3 times per day  . potassium chloride  30 mEq Oral Daily  . saccharomyces boulardii  250 mg Oral BID  . vancomycin  1,500 mg Intravenous Q12H  . zinc sulfate  220 mg Oral q morning - 10a   Objective: Temp:  [97.7 F (36.5 C)-98.7 F (37.1 C)] 97.7 F (36.5 C) (02/14 0651) Pulse Rate:  [66-107] 80 (02/14 0651) Resp:  [13-20] 18 (02/14 0651) BP: (106-137)/(45-90) 137/79 mmHg (02/14 0651) SpO2:  [96 %-100 %] 99 % (02/14 0651)  Lab Results Lab Results  Component Value Date   WBC 9.1 06/29/2013   HGB 8.5* 06/29/2013   HCT 28.1* 06/29/2013   MCV 77.6* 06/29/2013   PLT 489* 06/29/2013    Lab Results  Component Value Date   CREATININE 0.36* 06/29/2013   BUN 5* 06/29/2013   NA 143 06/29/2013   K 3.2* 06/29/2013   CL 104 06/29/2013   CO2 26 06/29/2013    Lab Results  Component Value Date   ALT 17 06/21/2013   AST 16 06/21/2013   ALKPHOS 105 06/21/2013   BILITOT <0.2* 06/21/2013      Microbiology: Recent Results (from the past 240 hour(s))  URINE CULTURE     Status: None   Collection Time    06/21/13  6:40  PM      Result Value Ref Range Status   Specimen Description URINE, CATHETERIZED   Final   Special Requests NONE   Final   Culture  Setup Time     Final   Value: 06/22/2013 00:42     Performed at [place]     Final   Value: >=100,000 COLONIES/ML     Performed at [facility]   Culture     Final   Value: PROVIDENCIA STUARTII     PSEUDOMONAS AERUGINOSA     Performed at [facility]   Report Status 06/24/2013 FINAL   Final   Organism ID, Bacteria PROVIDENCIA STUARTII   Final   Organism ID, Bacteria PSEUDOMONAS AERUGINOSA   Final  CULTURE, BLOOD (ROUTINE X 2)     Status: None   Collection Time    06/21/13  8:10 PM      Result Value Ref Range Status   Specimen Description BLOOD LEFT ARM   Final   Special  Requests BOTTLES DRAWN AEROBIC AND ANAEROBIC 5CC   Final   Culture  Setup Time     Final   Value: 06/22/2013 00:32     Performed at [facility]   Culture     Final   Value: NO GROWTH 5 DAYS     Performed at [facility]   Report Status 06/28/2013 FINAL   Final  CULTURE, ROUTINE-ABSCESS     Status: None   Collection Time    06/28/13  3:23 PM      Result Value Ref Range Status   Specimen Description HIP RIGHT HIP JOINT ASPIRATION   Final   Special Requests Normal   Final   Gram Stain     Final   Value: NO WBC SEEN     NO SQUAMOUS EPITHELIAL CELLS SEEN     NO ORGANISMS SEEN     Performed at [facility]   Culture PENDING   Incomplete   Report Status PENDING   Incomplete  ANAEROBIC CULTURE     Status: None   Collection Time    06/28/13  3:23 PM      Result Value Ref Range Status   Specimen Description OTHER RIGHT HIP JOINT ASPIRATION   Final   Special Requests Normal   Final   Gram Stain     Final   Value: NO WBC SEEN     NO SQUAMOUS EPITHELIAL CELLS SEEN     NO ORGANISMS SEEN     Performed at [facility]   Culture PENDING   Incomplete   Report Status PENDING   Incomplete   Assessment: A  small amount of bloody fluid was aspirated from his infected right hip joint yesterday. Gram stain shows no organisms and I expect that the cultures will remain negative given that he's been on antibiotics for over one week. I will continue his current empiric therapy agree with PICC placement.  Plan: 1. Continue vancomycin and piperacillin tazobactam pending final cultures  Michel Bickers, MD [facility] [place] [phone] pager   [phone] cell 06/29/2013, 11:05 AM

## 2013-06-30 ENCOUNTER — Encounter (HOSPITAL_COMMUNITY): Payer: Self-pay | Admitting: Radiology

## 2013-06-30 LAB — VANCOMYCIN, TROUGH: VANCOMYCIN TR: 20.7 ug/mL — AB (ref 10.0–20.0)

## 2013-06-30 LAB — POTASSIUM
POTASSIUM: 5.1 meq/L (ref 3.7–5.3)
Potassium: 5.7 mEq/L — ABNORMAL HIGH (ref 3.7–5.3)

## 2013-06-30 MED ORDER — VANCOMYCIN HCL 10 G IV SOLR
1250.0000 mg | Freq: Two times a day (BID) | INTRAVENOUS | Status: DC
  Administered 2013-07-01 (×2): 1250 mg via INTRAVENOUS
  Filled 2013-06-30 (×3): qty 1250

## 2013-06-30 NOTE — Progress Notes (Signed)
  06/29/2013 1300 NCM spoke to pt and states he has 24 hour caregiver at home,  [name] # [phone]. Offered choice for [facility]. Requested [facility] for [facility]. [facility] aware of need for IV abx once dc. Waiting PICC placement. Per pt, no DME needed at home. Jonnie Finner RN CCM Case Mgmt phone [phone]
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
## 2013-06-30 NOTE — Progress Notes (Signed)
[facility] PROGRESS NOTE  [name] [name] ZJI:[id number] DOB: 03-05-1966 DOA: 06/21/2013 PCP: [name], MD   Assessment/Plan: Chronic osteomyelitis - Repeated MRI this hospitalization as below.  - IR abscess drained 2/13, send for cultures, stain; pending, no organisms; leukocytosis resolved after drainage.  - PICC line placement unsuccessful, midline to be placed tomorrow.  Complicated UTI with suprapubic cath and sepsis - ID following, appreciate input HTN - Continue Norvasc Quadriplegia - continue baclofen Anemia - monitor CBC, suspect underlying chronic disease contributing.  Leukocytosis - due to chronic osteomyelitis.  Obesity  Code Status: Full Family Communication: Pt in room  Disposition Plan: Pending, once PICC line in and have Abx plan per ID can be discharged.   Consultants:  ID  Procedures:  MRI   Antibiotics:  Vanc 06/21/13>>>  Zosyn 06/21/13>>>2/7, resumed 2/9>>>  Primaxin 2/7>>>2/9  HPI/Subjective: No acute events noted. No complaints.  Objective: Filed Vitals:   06/29/13 0651 06/29/13 1449 06/29/13 2238 06/30/13 0555  BP: 137/79 137/78 126/70 153/79  Pulse: 80 83 85 93  Temp: 97.7 F (36.5 C) 97.6 F (36.4 C) 98.2 F (36.8 C) 98.3 F (36.8 C)  TempSrc: Oral Oral Oral Oral  Resp: 18 18 18 18   Height:      Weight:      SpO2: 99% 96% 100% 97%    Intake/Output Summary (Last 24 hours) at 06/30/13 0906 Last data filed at 06/29/13 2306  Gross per 24 hour  Intake      0 ml  Output   4300 ml  Net  -4300 ml   Filed Weights   06/21/13 2230  Weight: 102.1 kg (225 lb 1.4 oz)    Exam:  General:  Awake, in nad  Cardiovascular: regular, s1, s2  Respiratory: normal resp effort, no wheezing  Abdomen: soft, nondistended  Musculoskeletal: perfused, contractures over UE B   Data Reviewed: Basic Metabolic Panel:  Recent Labs Lab 06/26/13 1615 06/27/13 0555 06/28/13 0600 06/29/13 0828 06/30/13 0521  NA 141 140 140 143   --   K 3.2* 3.3* 3.3* 3.2* 5.7*  CL 104 103 103 104  --   CO2 25 24 26 26   --   GLUCOSE 127* 102* 95 95  --   BUN 7 7 9  5*  --   CREATININE 0.44* 0.40* 0.46* 0.36*  --   CALCIUM 8.3* 8.5 8.8 9.2  --     CBC:  Recent Labs Lab 06/26/13 1615 06/27/13 0555 06/28/13 0600 06/29/13 0828  WBC 14.6* 15.9* 16.6* 9.1  NEUTROABS 10.8*  --   --   --   HGB 9.0* 8.3* 8.2* 8.5*  HCT 28.4* 27.2* 27.4* 28.1*  MCV 77.0* 78.2 78.3 77.6*  PLT 429* 421* 439* 489*     Recent Results (from the past 240 hour(s))  URINE CULTURE     Status: None   Collection Time    06/21/13  6:40 PM      Result Value Ref Range Status   Specimen Description URINE, CATHETERIZED   Final   Special Requests NONE   Final   Culture  Setup Time     Final   Value: 06/22/2013 00:42     Performed at [place]     Final   Value: >=100,000 COLONIES/ML     Performed at [facility]   Culture     Final   Value: PROVIDENCIA STUARTII     PSEUDOMONAS AERUGINOSA     Performed at  [facility]   Report Status 06/24/2013 FINAL   Final   Organism ID, Bacteria PROVIDENCIA STUARTII   Final   Organism ID, Bacteria PSEUDOMONAS AERUGINOSA   Final  CULTURE, BLOOD (ROUTINE X 2)     Status: None   Collection Time    06/21/13  8:10 PM      Result Value Ref Range Status   Specimen Description BLOOD LEFT ARM   Final   Special Requests BOTTLES DRAWN AEROBIC AND ANAEROBIC 5CC   Final   Culture  Setup Time     Final   Value: 06/22/2013 00:32     Performed at [facility]   Culture     Final   Value: NO GROWTH 5 DAYS     Performed at [facility]   Report Status 06/28/2013 FINAL   Final  CULTURE, ROUTINE-ABSCESS     Status: None   Collection Time    06/28/13  3:23 PM      Result Value Ref Range Status   Specimen Description HIP RIGHT HIP JOINT ASPIRATION   Final   Special Requests Normal   Final   Gram Stain     Final   Value: NO WBC SEEN     NO SQUAMOUS EPITHELIAL CELLS  SEEN     NO ORGANISMS SEEN     Performed at [facility]   Culture     Final   Value: NO GROWTH 1 DAY     Performed at [facility]   Report Status PENDING   Incomplete  ANAEROBIC CULTURE     Status: None   Collection Time    06/28/13  3:23 PM      Result Value Ref Range Status   Specimen Description OTHER RIGHT HIP JOINT ASPIRATION   Final   Special Requests Normal   Final   Gram Stain     Final   Value: NO WBC SEEN     NO SQUAMOUS EPITHELIAL CELLS SEEN     NO ORGANISMS SEEN     Performed at [facility]   Culture     Final   Value: NO ANAEROBES ISOLATED; CULTURE IN PROGRESS FOR 5 DAYS     Performed at [facility]   Report Status PENDING   Incomplete     Studies: Ct Aspiration  06/29/2013   CLINICAL DATA:  Quadriplegia and chronic pelvic and hip osteomyelitis. Recent MRI has demonstrated suggestion of fluid collection adjacent to the right hip joint.  EXAM: CT GUIDED ASPIRATION OF RIGHT HIP  ANESTHESIA/SEDATION: 0.5 Mg IV Versed 50 mcg IV Fentanyl  Total Moderate Sedation Time: 10 minutes  PROCEDURE: The procedure, risks, benefits, and alternatives were explained to the patient. Questions regarding the procedure were encouraged and answered. The patient understands and consents to the procedure.  The right hip region was prepped with Betadinein a sterile fashion, and a sterile drape was applied covering the operative field. A sterile gown and sterile gloves were used for the procedure. Local anesthesia was provided with 1% Lidocaine.  CT was performed in a supine position. Under CT guidance, an 18 gauge trocar needle was advanced into the region of the right hip joint. The needle was repositioned several times as aspiration was performed. A sample of fluid was sent for culture analysis.  COMPLICATIONS: None  FINDINGS: The 18 gauge needle was advanced in multiple locations in and around the right hip joint including the destroyed femoral head and adjacent  inflammation.  There was very little return of free fluid with only a small amount of bloody fluid return. This was combined with some sterile saline and sent for culture.  IMPRESSION: CT-guided aspiration at the level of chronic right hip osteomyelitis. Aspiration yielded only a small amount of bloody fluid which was sent for culture analysis.   Electronically Signed   By: Aletta Edouard M.D.   On: 06/29/2013 13:27    Scheduled Meds: . amLODipine  5 mg Oral q morning - 10a  . baclofen  20 mg Oral QID  . docusate sodium  100 mg Oral BID  . famotidine  20 mg Oral BID  . furosemide  20 mg Oral BID  . heparin subcutaneous  5,000 Units Subcutaneous 3 times per day  . metoCLOPramide  10 mg Oral TID AC  . multivitamin with minerals  1 tablet Oral q morning - 10a  . piperacillin-tazobactam (ZOSYN)  IV  3.375 g Intravenous 3 times per day  . saccharomyces boulardii  250 mg Oral BID  . vancomycin  1,500 mg Intravenous Q12H  . zinc sulfate  220 mg Oral q morning - 10a   Continuous Infusions: . sodium chloride    . sodium chloride 75 mL/hr at 06/29/13 0510   Principal Problem:   Complicated UTI (urinary tract infection) Active Problems:   Obesity   Quadriplegia   Leukocytosis   Sacral decubitus ulcer, stage IV   Suprapubic catheter   Anemia   Chronic osteomyelitis, pelvic region and thigh   UTI (lower urinary tract infection)  Time spent: 15 min  [place], [place] [facility] Pager [phone]. If 7PM-7AM, please contact night-coverage at [email or web address], password [facility] 06/30/2013, 9:06 AM  LOS: 9 days

## 2013-06-30 NOTE — Progress Notes (Signed)
Offered to assist pt with cpap, but pt stated he would not be ready until after 2330.  RT will check back with pt at that time.

## 2013-06-30 NOTE — Progress Notes (Signed)
ANTIBIOTIC CONSULT NOTE - FOLLOW UP  Pharmacy Consult for Vancomycin Indication: osteomyelitis, decubitus ulcer, uti   Allergies  Allergen Reactions  . Ditropan [Oxybutynin] Other (See Comments)    hallucinations    Patient Measurements: Height: 6' (182.9 cm) Weight: 225 lb 1.4 oz (102.1 kg) IBW/kg (Calculated) : 77.6  Vital Signs: Temp: 98.6 F (37 C) (02/15 1337) Temp src: Oral (02/15 1337) BP: 164/92 mmHg (02/15 1337) Pulse Rate: 86 (02/15 1337) Intake/Output from previous day: 02/14 0701 - 02/15 0700 In: -  Out: 4300 [Urine:4000; Stool:300] Intake/Output from this shift:    Labs:  Recent Labs  06/28/13 0600 06/29/13 0828  WBC 16.6* 9.1  HGB 8.2* 8.5*  PLT 439* 489*  CREATININE 0.46* 0.36*   Estimated Creatinine Clearance: 142.6 ml/min (by C-G formula based on Cr of 0.36).  Recent Labs  06/28/13 0600 06/30/13 1745  VANCOTROUGH 17.2 20.7*     Microbiology: Recent Results (from the past 720 hour(s))  URINE CULTURE     Status: None   Collection Time    06/21/13  6:40 PM      Result Value Ref Range Status   Specimen Description URINE, CATHETERIZED   Final   Special Requests NONE   Final   Culture  Setup Time     Final   Value: 06/22/2013 00:42     Performed at [place]     Final   Value: >=100,000 COLONIES/ML     Performed at [facility]   Culture     Final   Value: PROVIDENCIA STUARTII     PSEUDOMONAS AERUGINOSA     Performed at [facility]   Report Status 06/24/2013 FINAL   Final   Organism ID, Bacteria PROVIDENCIA STUARTII   Final   Organism ID, Bacteria PSEUDOMONAS AERUGINOSA   Final  CULTURE, BLOOD (ROUTINE X 2)     Status: None   Collection Time    06/21/13  8:10 PM      Result Value Ref Range Status   Specimen Description BLOOD LEFT ARM   Final   Special Requests BOTTLES DRAWN AEROBIC AND ANAEROBIC 5CC   Final   Culture  Setup Time     Final   Value: 06/22/2013 00:32     Performed at  [facility]   Culture     Final   Value: NO GROWTH 5 DAYS     Performed at [facility]   Report Status 06/28/2013 FINAL   Final  CULTURE, ROUTINE-ABSCESS     Status: None   Collection Time    06/28/13  3:23 PM      Result Value Ref Range Status   Specimen Description HIP RIGHT HIP JOINT ASPIRATION   Final   Special Requests Normal   Final   Gram Stain     Final   Value: NO WBC SEEN     NO SQUAMOUS EPITHELIAL CELLS SEEN     NO ORGANISMS SEEN     Performed at [facility]   Culture     Final   Value: NO GROWTH 2 DAYS     Performed at [facility]   Report Status PENDING   Incomplete  ANAEROBIC CULTURE     Status: None   Collection Time    06/28/13  3:23 PM      Result Value Ref Range Status   Specimen Description OTHER RIGHT HIP JOINT ASPIRATION   Final   Special Requests  Normal   Final   Gram Stain     Final   Value: NO WBC SEEN     NO SQUAMOUS EPITHELIAL CELLS SEEN     NO ORGANISMS SEEN     Performed at [facility]   Culture     Final   Value: NO ANAEROBES ISOLATED; CULTURE IN PROGRESS FOR 5 DAYS     Performed at [facility]   Report Status PENDING   Incomplete    Anti-infectives   Start     Dose/Rate Route Frequency Ordered Stop   06/25/13 0600  vancomycin (VANCOCIN) 1,500 mg in sodium chloride 0.9 % 500 mL IVPB     1,500 mg 250 mL/hr over 120 Minutes Intravenous Every 12 hours 06/24/13 1839     06/24/13 1930  vancomycin (VANCOCIN) 500 mg in sodium chloride 0.9 % 100 mL IVPB     500 mg 100 mL/hr over 60 Minutes Intravenous  Once 06/24/13 1839 06/24/13 2112   06/24/13 1400  piperacillin-tazobactam (ZOSYN) IVPB 3.375 g     3.375 g 12.5 mL/hr over 240 Minutes Intravenous 3 times per day 06/24/13 1236     06/22/13 1800  imipenem-cilastatin (PRIMAXIN) 500 mg in sodium chloride 0.9 % 100 mL IVPB  Status:  Discontinued     500 mg 200 mL/hr over 30 Minutes Intravenous 4 times per day 06/22/13 1743 06/24/13 1225   06/22/13  0600  vancomycin (VANCOCIN) IVPB 1000 mg/200 mL premix  Status:  Discontinued     1,000 mg 200 mL/hr over 60 Minutes Intravenous Every 12 hours 06/22/13 0308 06/24/13 1838   06/22/13 0600  piperacillin-tazobactam (ZOSYN) IVPB 3.375 g  Status:  Discontinued     3.375 g 12.5 mL/hr over 240 Minutes Intravenous 3 times per day 06/22/13 0308 06/22/13 1743   06/21/13 2200  ceFEPIme (MAXIPIME) 1 g in dextrose 5 % 50 mL IVPB  Status:  Discontinued     1 g 100 mL/hr over 30 Minutes Intravenous Every 12 hours 06/21/13 2158 06/21/13 2204   06/21/13 2100  vancomycin (VANCOCIN) IVPB 1000 mg/200 mL premix     1,000 mg 200 mL/hr over 60 Minutes Intravenous  Once 06/21/13 2022 06/26/13 0919   06/21/13 2045  piperacillin-tazobactam (ZOSYN) IVPB 3.375 g     3.375 g 100 mL/hr over 30 Minutes Intravenous  Once 06/21/13 2022 06/21/13 2128      Assessment:  48 yo M with hx of complicated UTI, Sacral decubitus ulcer (stage IV), suprapubic catheter, chronic OM through pelvic region and thigh.  Urine culture growing  Providencia and Psuedomonas, both sensitive to Zosyn.  ID following.   2/6 >> Vanc >> 2/6 >> Zosyn >> 2/7 2/7 >> Primaxin >> 2/9 2/9 >> Zosyn >>   Micro 2/6 Blood x 1: NGTD 2.6 Urine: > 100k Pseudomonas (Sens to AG, 4th generation ceph, Zosyn)/Providencia (Sensitive to zosyn/rocephin)  Tmax: AF WBCs: WNL today  Renal: Scr 0.46 stable, CrCl N > 100 Vancomycin trough 20.7 on 1500 mg q12h  Goal of Therapy:  Vancomycin Trough 15-20  eradication of infection   Plan:  Reduce vancomycin to 1250 mg IV q12h for slightly elevated trough.   Hershal Coria PharmD 7:28 PM 06/30/2013

## 2013-07-01 ENCOUNTER — Inpatient Hospital Stay (HOSPITAL_COMMUNITY): Payer: Medicare Other

## 2013-07-01 LAB — CBC
HEMATOCRIT: 31.6 % — AB (ref 39.0–52.0)
HEMOGLOBIN: 10 g/dL — AB (ref 13.0–17.0)
MCH: 24.6 pg — ABNORMAL LOW (ref 26.0–34.0)
MCHC: 31.6 g/dL (ref 30.0–36.0)
MCV: 77.6 fL — ABNORMAL LOW (ref 78.0–100.0)
Platelets: 464 10*3/uL — ABNORMAL HIGH (ref 150–400)
RBC: 4.07 MIL/uL — ABNORMAL LOW (ref 4.22–5.81)
RDW: 16.6 % — ABNORMAL HIGH (ref 11.5–15.5)
WBC: 11.1 10*3/uL — AB (ref 4.0–10.5)

## 2013-07-01 LAB — POTASSIUM: Potassium: 2.8 mEq/L — CL (ref 3.7–5.3)

## 2013-07-01 MED ORDER — PIPERACILLIN-TAZOBACTAM 3.375 G IVPB: 3.3750 g | Freq: Three times a day (TID) | INTRAVENOUS | Status: DC

## 2013-07-01 MED ORDER — VANCOMYCIN HCL 10 G IV SOLR: 1250.0000 mg | Freq: Two times a day (BID) | INTRAVENOUS | Status: DC

## 2013-07-01 MED ORDER — POTASSIUM CHLORIDE CRYS ER 20 MEQ PO TBCR
40.0000 meq | EXTENDED_RELEASE_TABLET | Freq: Once | ORAL | Status: AC
  Administered 2013-07-01: 40 meq via ORAL
  Filled 2013-07-01: qty 2

## 2013-07-01 NOTE — Procedures (Signed)
Successful placement of right IJ approach 35 cm tunneled dual lumen PICC line with tip at the superior caval-atrial junction.  The PICC line is ready for immediate use.

## 2013-07-01 NOTE — Progress Notes (Signed)
[facility] [address] [place] [id number] [place] [address] Phone: [phone] Fax: [phone]  July 01, 2013  Patient: [name] [name]  Date of Birth: Jun 10, 1965  Date of Visit: 06/21/2013    To Whom It May Concern:  [name] [name] was seen and treated in our emergency department on 06/21/2013. [name] [name]  was admitted to [facility] on 06/21/2013. He is discharged on 07/01/2013.  Sincerely,   Marvis Moeller RN, BSN-BC

## 2013-07-01 NOTE — Progress Notes (Signed)
[place] ([facility]) notified of patient's Central Line (IJ) placed and patient anticipation of discharge today. Representative from [place] states all medications are set up from home.

## 2013-07-01 NOTE — Progress Notes (Addendum)
CRITICAL VALUE ALERT  Critical value received:  Potassium 2.8  Date of notification:  07/01/13  Time of notification:  1300  Critical value read back: yes  Nurse who received alert:  Mannie Stabile RN  MD notified (1st page):  Dr. Cruzita Lederer  Time of first page:  1305  MD notified (2nd page):  Time of second page:  Responding MD:  Dr. Cruzita Lederer  Time MD responded:

## 2013-07-01 NOTE — Progress Notes (Addendum)
Patient's assessment has not changed from am. Patient is stable at discharge. Patient was transported by [facility] at 1910 to home. Patient was given prescriptions and discharge forms.

## 2013-07-01 NOTE — Progress Notes (Signed)
Pharmacy Consult Note - Vancomycin/Zosyn Follow Up  A/P: See Dr. Storm Frisk note for lab requirements for outpatient abx use in this patient. Note Vancomycin dose changed to 1250mg  IV q12 this AM so a vanc trough should be checked prior to 4th dose tomorrow 2/17 night with goal of 15-20 and dose adjustments made accordingly by [place], PharmD, BCPS Pager [phone] 07/01/2013 2:19 PM

## 2013-07-01 NOTE — Progress Notes (Addendum)
Patient ID: [name] [name], male   DOB: 12-Aug-1965, 48 y.o.   MRN: [id number]         [place] [facility]    Date of Admission:  06/21/2013           Day 10 vancomycin        Day 10 piperacillin tazobactam Principal Problem:   Complicated UTI (urinary tract infection) Active Problems:   Obesity   Quadriplegia   Leukocytosis   Sacral decubitus ulcer, stage IV   Suprapubic catheter   Anemia   Chronic osteomyelitis, pelvic region and thigh   UTI (lower urinary tract infection)   . amLODipine  5 mg Oral q morning - 10a  . baclofen  20 mg Oral QID  . docusate sodium  100 mg Oral BID  . famotidine  20 mg Oral BID  . furosemide  20 mg Oral BID  . heparin subcutaneous  5,000 Units Subcutaneous 3 times per day  . metoCLOPramide  10 mg Oral TID AC  . multivitamin with minerals  1 tablet Oral q morning - 10a  . piperacillin-tazobactam (ZOSYN)  IV  3.375 g Intravenous 3 times per day  . saccharomyces boulardii  250 mg Oral BID  . vancomycin  1,250 mg Intravenous Q12H  . zinc sulfate  220 mg Oral q morning - 10a   Objective: Temp:  [97.7 F (36.5 C)-98.6 F (37 C)] 97.7 F (36.5 C) (02/16 0538) Pulse Rate:  [67-86] 84 (02/16 0538) Resp:  [16-20] 16 (02/16 0538) BP: (150-164)/(84-92) 154/84 mmHg (02/16 0538) SpO2:  [96 %-99 %] 97 % (02/16 0538) gen = a xo by 3, working on laptop HEENT = no oral thrush PULM = CTAB Cors = nl s1, s2 Lab Results Lab Results  Component Value Date   WBC 11.1* 07/01/2013   HGB 10.0* 07/01/2013   HCT 31.6* 07/01/2013   MCV 77.6* 07/01/2013   PLT 464* 07/01/2013    Lab Results  Component Value Date   CREATININE 0.36* 06/29/2013   BUN 5* 06/29/2013   NA 143 06/29/2013   K 5.1 06/30/2013   CL 104 06/29/2013   CO2 26 06/29/2013    Lab Results  Component Value Date   ALT 17 06/21/2013   AST 16 06/21/2013   ALKPHOS 105 06/21/2013   BILITOT <0.2* 06/21/2013      Microbiology: Recent Results (from the past 240 hour(s))  URINE CULTURE      Status: None   Collection Time    06/21/13  6:40 PM      Result Value Ref Range Status   Specimen Description URINE, CATHETERIZED   Final   Special Requests NONE   Final   Culture  Setup Time     Final   Value: 06/22/2013 00:42     Performed at [facility]     Final   Value: >=100,000 COLONIES/ML     Performed at [facility]   Culture     Final   Value: PROVIDENCIA STUARTII     PSEUDOMONAS AERUGINOSA     Performed at [facility]   Report Status 06/24/2013 FINAL   Final   Organism ID, Bacteria PROVIDENCIA STUARTII   Final   Organism ID, Bacteria PSEUDOMONAS AERUGINOSA   Final  CULTURE, BLOOD (ROUTINE X 2)     Status: None   Collection Time    06/21/13  8:10 PM      Result Value Ref Range Status  Specimen Description BLOOD LEFT ARM   Final   Special Requests BOTTLES DRAWN AEROBIC AND ANAEROBIC 5CC   Final   Culture  Setup Time     Final   Value: 06/22/2013 00:32     Performed at [facility]   Culture     Final   Value: NO GROWTH 5 DAYS     Performed at [facility]   Report Status 06/28/2013 FINAL   Final  CULTURE, ROUTINE-ABSCESS     Status: None   Collection Time    06/28/13  3:23 PM      Result Value Ref Range Status   Specimen Description HIP RIGHT HIP JOINT ASPIRATION   Final   Special Requests Normal   Final   Gram Stain     Final   Value: NO WBC SEEN     NO SQUAMOUS EPITHELIAL CELLS SEEN     NO ORGANISMS SEEN     Performed at [facility]   Culture     Final   Value: NO GROWTH 3 DAYS     Performed at [facility]   Report Status PENDING   Incomplete  ANAEROBIC CULTURE     Status: None   Collection Time    06/28/13  3:23 PM      Result Value Ref Range Status   Specimen Description OTHER RIGHT HIP JOINT ASPIRATION   Final   Special Requests Normal   Final   Gram Stain     Final   Value: NO WBC SEEN     NO SQUAMOUS EPITHELIAL CELLS SEEN     NO ORGANISMS SEEN     Performed at [facility]   Culture     Final   Value: NO ANAEROBES ISOLATED; CULTURE IN PROGRESS FOR 5 DAYS     Performed at [facility]   Report Status PENDING   Incomplete   Assessment: Chronic pelvic osteomyelitis with elevated inflammatory markers, aspirate of fluid collection NGTD  Plan: 1. Would get picc line so that he can receive vancomycin and piperacillin tazobactam for a total of 6wks, currently on day 10 of 42 days, end date is MArch 19th 2. Will have him follow up with [name] as an outpatient in 2 wks and 4 wks 3. Please have home health do weekly labs: cbc, bmp, and dressing changes to picc line on weekly basis 4. vanco trough should be 15-20, if vanco dosing is adjusted, vanco trough should be checked before 4th dose even if not yet time to do weekly blood draws (please let home health know this) 5. Please let home health know that they can discontinue picc line at completion of antibiotics on march 19th  Carlyle Basques, [place] for [place] [phone]   [phone] cell 07/01/2013, 10:32 AM

## 2013-07-02 LAB — CULTURE, ROUTINE-ABSCESS
Culture: NO GROWTH
GRAM STAIN: NONE SEEN
Special Requests: NORMAL

## 2013-07-02 LAB — ANAEROBIC CULTURE
Gram Stain: NONE SEEN
SPECIAL REQUESTS: NORMAL

## 2013-07-02 NOTE — Discharge Summary (Addendum)
Physician Discharge Summary  [name] [name]:[id number] DOB: 29-Nov-1965 DOA: 06/21/2013  PCP: [name], MD  Admit date: 06/21/2013 Discharge date: 07/02/2013  Time spent: 35 minutes  Recommendations for Outpatient Follow-up:  1. Follow up with ID as an outpatient in 2-4 weeks  2. Continue vancomycin and Zosyn, end date 08/01/2013.   Discharge Diagnoses:  Principal Problem:   Complicated UTI (urinary tract infection) Active Problems:   Obesity   Quadriplegia   Leukocytosis   Sacral decubitus ulcer, stage IV   Suprapubic catheter   Anemia   Chronic osteomyelitis, pelvic region and thigh   UTI (lower urinary tract infection)  Discharge Condition: stable  Diet recommendation: regular  Filed Weights   06/21/13 2230  Weight: 102.1 kg (225 lb 1.4 oz)   History of present illness:  [name] is a 48 y.o. male with h/o quadraplegia, suprapubic catheter, colostomy, stage 4 decubitus ulcer with chronic osteomyelitis. Patient presents to the ED today with 1 day history of fever. Symptoms onset at 11 AM. Associated with chills and mild headache. Patient tried tylenol but fever increased. No respiratory symptoms, no sick contacts, no CP, SOB, N/V/D. Large decubitus ulcers have increased in drainage and odor since last week. 4 weeks since last cycle of vanc for chronic / recurrent osteomyelitis and this seems to reoccur about 1x a month for this patient.  Hospital Course:  Chronic osteomyelitis - Repeated MRI this hospitalization as below Interventional radiology consulted for possible lfuid aspiration, procedure done on 2/13, send for cultures, stain; leukocytosis improved significantly after drainage. Cultures remained negative on discharge and patient will be discharged on 6 weeks of Vancomycin and Zosyn per ID recommendations. PICC line was placed by IR. End date 08/01/2013.  - recommended on discharge to check Vanc troughs before every 4th dose and adjust to Vanc trough 15-20,  monitor renal function and CBC weekly.  Sepsis due to complicated UTI due to suprapubic cath- microbiology with Providencia and Pseudomonas, both sensitive to Zosyn.  HTN - Continue Norvasc  Quadriplegia - continue baclofen  Anemia - monitor CBC, suspect underlying chronic disease contributing.  Leukocytosis - due to chronic osteomyelitis.  Obesity  Procedures:  IR drainage  PICC Line placement.    Consultations:  ID  IR  Discharge Exam: Filed Vitals:   06/30/13 2357 07/01/13 0538 07/01/13 1059 07/01/13 1847  BP:  154/84 148/80 128/72  Pulse: 67 84  72  Temp:  97.7 F (36.5 C)  98.5 F (36.9 C)  TempSrc:  Oral  Oral  Resp: 16 16  16   Height:      Weight:      SpO2: 99% 97%  99%   General: NAD Cardiovascular: RRR Respiratory: CTA biL  Discharge Instructions   Future Appointments Provider Department Dept Phone   08/26/2013 1:45 PM Campbell Riches, MD [facility] [phone]       Medication List         amLODipine 10 MG tablet  Commonly known as:  NORVASC  Take 0.5 tablets (5 mg total) by mouth every morning.     baclofen 20 MG tablet  Commonly known as:  LIORESAL  Take 20 mg by mouth 4 (four) times daily.     docusate sodium 100 MG capsule  Commonly known as:  COLACE  Take 100 mg by mouth 2 (two) times daily.     famotidine 20 MG tablet  Commonly known as:  PEPCID  Take 20 mg by mouth 2 (  two) times daily.     FERRALET 90 PO  Take 1 capsule by mouth every morning.     furosemide 20 MG tablet  Commonly known as:  LASIX  Take 20 mg by mouth 2 (two) times daily.     metoCLOPramide 10 MG tablet  Commonly known as:  REGLAN  Take 1 tablet (10 mg total) by mouth 3 (three) times daily before meals.     multivitamin with minerals Tabs tablet  Take 1 tablet by mouth every morning.     piperacillin-tazobactam 3.375 GM/50ML IVPB  Commonly known as:  ZOSYN  Inject 50 mLs (3.375 g total) into the vein every 8  (eight) hours. For 5 weeks.     potassium chloride SA 20 MEQ tablet  Commonly known as:  K-DUR,KLOR-CON  Take 20 mEq by mouth 2 (two) times daily.     sodium chloride 0.9 % SOLN 250 mL with vancomycin 10 G SOLR 1,250 mg  Inject 1,250 mg into the vein every 12 (twelve) hours. For 5 weeks.     vitamin C 500 MG tablet  Commonly known as:  ASCORBIC ACID  Take 500 mg by mouth every morning.     zinc sulfate 220 MG capsule  Take 220 mg by mouth every morning.        The results of significant diagnostics from this hospitalization (including imaging, microbiology, ancillary and laboratory) are listed below for reference.    Significant Diagnostic Studies: Dg Chest 2 View  06/21/2013   CLINICAL DATA:  Fever  EXAM: CHEST  2 VIEW  COMPARISON:  [place] ABSCESS DRAINAGE dated 02/22/2013  FINDINGS: Low lung volumes. Cardiac silhouette is enlarged. There is decreased prominence of the interstitial markings. No focal regions of consolidation identified. The osseous structures demonstrate dextroscoliosis in the thoracolumbar spine.  IMPRESSION: Interstitial findings are less prominent when compared to previous study. An interstitial infectious or inflammatory infiltrate cannot be excluded. No component of these findings likely represent chronic bronchitic changes.   Electronically Signed   By: Margaree Mackintosh M.D.   On: 06/21/2013 18:46   Mr Pelvis W Wo Contrast  06/26/2013   CLINICAL DATA:  Chronic pelvic osteomyelitis. Sacral decubitus ulcer.  EXAM: MRI PELVIS WITHOUT AND WITH CONTRAST  TECHNIQUE: Multiplanar multisequence MR imaging of the pelvis was performed both before and after administration of intravenous contrast.  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 There is a peripherally enhancing small fluid collection measuring 4.5 x 1.3 cm superior to the femoral head. There is a large overlying decubitus ulcer with enhancement at the margin of the ulcer.  There is bone destruction of the left hip. There is no significant osseous signal abnormality. There is soft tissue enhancement within the left hip joint space. There is a peripherally enhancing fluid collection posterior to the hip measuring 6 x 1.3 cm likely representing a small abscess. There is a overlying skin defect posterior to the ischium.  There is a sacral decubitus ulcer overlying the left SI joint with mild marrow edema in the posterior sacrum and ilium with mild enhancement concerning for osteomyelitis. There is no adjacent fluid collection.  There is  a suprapubic catheter within the bladder.  IMPRESSION: 1. There are chronic destructive osseous changes of bilateral hips with irregular soft tissue enhancement within bilateral hip joints and adjacent fluid collections. There is no significant marrow signal abnormality. The overall appearance is consistent with chronic osteomyelitis with the fluid collections concerning for abscesses. Overall there is no significant changes in the appearance of bilateral hips compared with the CT of the pelvis performed on 02/27/2013. 2. There is a sacral decubitus ulcer overlying the left SI joint with mild marrow edema of the posterior left sacrum and ilium with enhancement concerning for osteomyelitis.   Electronically Signed   By: Kathreen Devoid   On: 06/26/2013 12:39   Ct Aspiration  06/29/2013   CLINICAL DATA:  Quadriplegia and chronic pelvic and hip osteomyelitis. Recent MRI has demonstrated suggestion of fluid collection adjacent to the right hip joint.  EXAM: CT GUIDED ASPIRATION OF RIGHT HIP  ANESTHESIA/SEDATION: 0.5 Mg IV Versed  50 mcg IV Fentanyl  Total Moderate Sedation Time: 10 minutes  PROCEDURE: The procedure, risks, benefits, and alternatives were explained to the patient. Questions regarding the procedure were encouraged and answered. The patient understands and consents to the procedure.  The right hip region was prepped with Betadinein a sterile fashion, and a sterile drape was applied covering the operative field. A sterile gown and sterile gloves were used for the procedure. Local anesthesia was provided with 1% Lidocaine.  CT was performed in a supine position. Under CT guidance, an 18 gauge trocar needle was advanced into the region of the right hip joint. The needle was repositioned several times as aspiration was performed. A sample of fluid was sent for culture analysis.  COMPLICATIONS: None  FINDINGS: The 18 gauge needle was advanced in multiple locations in and around the right hip joint including the destroyed femoral head and adjacent inflammation. There was very little return of free fluid with only a small amount of bloody fluid return. This was combined with some sterile saline and sent for culture.  IMPRESSION: CT-guided aspiration at the level of chronic right hip osteomyelitis. Aspiration yielded only a small amount of bloody fluid which was sent for culture analysis.   Electronically Signed   By: Aletta Edouard M.D.   On: 06/29/2013 13:27   Ir Fluoro Guide Cv Line Right  07/01/2013   INDICATION: Poor venous access, the bilateral upper extremities are no longer amenable to PICC line placement, in need of intravenous access for for blood draws and medication administration.  EXAM: TUNNELED PICC PLACEMENT WITH ULTRASOUND AND FLUOROSCOPIC GUIDANCE  MEDICATIONS: The patient is currently admitted to the hospital and receiving intravenous antibiotics. The IV antibiotic was given in an appropriate time interval prior to skin puncture.  ANESTHESIA/SEDATION: None  CONTRAST:  None  COMPARISON:  SP FLUORO GUIDE CV LINE*L*  dated 11/07/2012; IR FLUORO GUIDE CV LINE*R* dated 04/22/2012; SP FLUORO GUIDE CV LINE*L* dated 02/08/2012; SP FLUORO GUIDE CV LINE*L* dated 09/01/2012  FLUOROSCOPY TIME:  30 seconds  PROCEDURE: Informed written consent was obtained from the patient after a discussion of the risks, benefits, and alternatives to treatment. Questions regarding the procedure were encouraged and answered. The right neck and chest were prepped with chlorhexidine in a sterile fashion, and a sterile drape was applied covering the operative field. Maximum barrier sterile technique with sterile gowns and gloves were used for the procedure. A timeout was performed prior to the initiation of the procedure.  After creating a small venotomy incision, a micropuncture kit  was utilized to access the right internal jugular vein under direct, real-time ultrasound guidance after the overlying soft tissues were anesthetized with 1% lidocaine with epinephrine. Ultrasound image documentation was performed. The microwire was kinked to measure appropriate catheter length. A peel-away a micropuncture sheath was placed under intermittent fluoroscopic guidance. A PICC measuring 25 cm from tip to cuff was tunneled in a retrograde fashion from the anterior chest wall to the venotomy incision.  The catheter was then placed through the peel-away sheath with tips ultimately positioned within the superior cavoatrial junction. Final catheter positioning was confirmed and documented with a spot radiographic image. The catheter aspirates and flushes normally. The catheter was flushed with appropriate volume heparin dwells.  The catheter exit site was secured with a 0-Prolene retention suture. The venotomy incision was closed with an interrupted 4-0 Vicryl, Dermabond and Steri-strips. Dressings were applied. The patient tolerated the procedure well without immediate post procedural complication.  COMPLICATIONS: None immediate  IMPRESSION: Successful placement of 25 cm tip  to cuff tunneled PICC via the right internal jugular vein with tips terminating within the superior cavoatrial junction. The catheter is ready for immediate use.   Electronically Signed   By: Sandi Mariscal M.D.   On: 07/01/2013 16:25   Ir US Guide Vasc Access Right  07/01/2013   INDICATION: Poor venous access, the bilateral upper extremities are no longer amenable to PICC line placement, in need of intravenous access for for blood draws and medication administration.  EXAM: TUNNELED PICC PLACEMENT WITH ULTRASOUND AND FLUOROSCOPIC GUIDANCE  MEDICATIONS: The patient is currently admitted to the hospital and receiving intravenous antibiotics. The IV antibiotic was given in an appropriate time interval prior to skin puncture.  ANESTHESIA/SEDATION: None  CONTRAST:  None  COMPARISON:  SP FLUORO GUIDE CV LINE*L* dated 11/07/2012; IR FLUORO GUIDE CV LINE*R* dated 04/22/2012; SP FLUORO GUIDE CV LINE*L* dated 02/08/2012; SP FLUORO GUIDE CV LINE*L* dated 09/01/2012  FLUOROSCOPY TIME:  30 seconds  PROCEDURE: Informed written consent was obtained from the patient after a discussion of the risks, benefits, and alternatives to treatment. Questions regarding the procedure were encouraged and answered. The right neck and chest were prepped with chlorhexidine in a sterile fashion, and a sterile drape was applied covering the operative field. Maximum barrier sterile technique with sterile gowns and gloves were used for the procedure. A timeout was performed prior to the initiation of the procedure.  After creating a small venotomy incision, a micropuncture kit was utilized to access the right internal jugular vein under direct, real-time ultrasound guidance after the overlying soft tissues were anesthetized with 1% lidocaine with epinephrine. Ultrasound image documentation was performed. The microwire was kinked to measure appropriate catheter length. A peel-away a micropuncture sheath was placed under intermittent fluoroscopic guidance.  A PICC measuring 25 cm from tip to cuff was tunneled in a retrograde fashion from the anterior chest wall to the venotomy incision.  The catheter was then placed through the peel-away sheath with tips ultimately positioned within the superior cavoatrial junction. Final catheter positioning was confirmed and documented with a spot radiographic image. The catheter aspirates and flushes normally. The catheter was flushed with appropriate volume heparin dwells.  The catheter exit site was secured with a 0-Prolene retention suture. The venotomy incision was closed with an interrupted 4-0 Vicryl, Dermabond and Steri-strips. Dressings were applied. The patient tolerated the procedure well without immediate post procedural complication.  COMPLICATIONS: None immediate  IMPRESSION: Successful placement of 25 cm tip to cuff tunneled PICC  via the right internal jugular vein with tips terminating within the superior cavoatrial junction. The catheter is ready for immediate use.   Electronically Signed   By: Sandi Mariscal M.D.   On: 07/01/2013 16:25    Microbiology: Recent Results (from the past 240 hour(s))  CULTURE, ROUTINE-ABSCESS     Status: None   Collection Time    06/28/13  3:23 PM      Result Value Ref Range Status   Specimen Description HIP RIGHT HIP JOINT ASPIRATION   Final   Special Requests Normal   Final   Gram Stain     Final   Value: NO WBC SEEN     NO SQUAMOUS EPITHELIAL CELLS SEEN     NO ORGANISMS SEEN     Performed at [facility]   Culture     Final   Value: NO GROWTH 3 DAYS     Performed at [facility]   Report Status 07/02/2013 FINAL   Final  ANAEROBIC CULTURE     Status: None   Collection Time    06/28/13  3:23 PM      Result Value Ref Range Status   Specimen Description OTHER RIGHT HIP JOINT ASPIRATION   Final   Special Requests Normal   Final   Gram Stain     Final   Value: NO WBC SEEN     NO SQUAMOUS EPITHELIAL CELLS SEEN     NO ORGANISMS SEEN     Performed at  [facility]   Culture     Final   Value: NO ANAEROBES ISOLATED     Performed at [facility]   Report Status 07/02/2013 FINAL   Final    Labs: Basic Metabolic Panel:  Recent Labs Lab 06/26/13 1615 06/27/13 0555 06/28/13 0600 06/29/13 0828 06/30/13 0521 06/30/13 1030 07/01/13 1120  NA 141 140 140 143  --   --   --   K 3.2* 3.3* 3.3* 3.2* 5.7* 5.1 2.8*  CL 104 103 103 104  --   --   --   CO2 25 24 26 26   --   --   --   GLUCOSE 127* 102* 95 95  --   --   --   BUN 7 7 9  5*  --   --   --   CREATININE 0.44* 0.40* 0.46* 0.36*  --   --   --   CALCIUM 8.3* 8.5 8.8 9.2  --   --   --    CBC:  Recent Labs Lab 06/26/13 1615 06/27/13 0555 06/28/13 0600 06/29/13 0828 07/01/13 0543  WBC 14.6* 15.9* 16.6* 9.1 11.1*  NEUTROABS 10.8*  --   --   --   --   HGB 9.0* 8.3* 8.2* 8.5* 10.0*  HCT 28.4* 27.2* 27.4* 28.1* 31.6*  MCV 77.0* 78.2 78.3 77.6* 77.6*  PLT 429* 421* 439* 489* 464*     Signed:  Jathen Sudano  [facility] 07/02/2013, 11:38 AM

## 2013-07-09 NOTE — Progress Notes (Signed)
[facility]  NAME:  BIRCH, FARINO NO.:  MEDICAL RECORD NO.:  [id number]      DATE OF BIRTH:  08/13/1965  PHYSICIAN:  [name], DO       VISIT DATE:  07/08/2013                                  OFFICE VISIT   The patient is a 48 year old male known to the clinic.  He is very nice. He is here for a followup on his chronic sacral ulcers.  He is at home, but he has a full-time caregiver who is able to help.  He is doing much better than he was even a year ago.  He says he is doing __________ and offloading.  The wounds actually are looking a little bit better overall, but the one on the right is still tracking down to bone.  He is on antibiotics.  We recommend alginate on all the other wounds and then on the deep right wound wet to dry.  We will check a pre-albumin. Offloading is real important.  Continue with the protein and depending on his prealbumin, we will see if he is a candidate for a flap.     [name], DO     CS/MEDQ  D:  07/08/2013  T:  07/09/2013  Job:  [id number]

## 2013-07-19 ENCOUNTER — Emergency Department (HOSPITAL_COMMUNITY)
Admission: EM | Admit: 2013-07-19 | Discharge: 2013-07-19 | Disposition: A | Discharge status: Home / Self Care | Payer: Medicare Other | Attending: Emergency Medicine | Admitting: Emergency Medicine

## 2013-07-19 ENCOUNTER — Encounter (HOSPITAL_COMMUNITY): Payer: Self-pay | Admitting: Emergency Medicine

## 2013-07-19 DIAGNOSIS — Z872 Personal history of diseases of the skin and subcutaneous tissue: Secondary | ICD-10-CM | POA: Insufficient documentation

## 2013-07-19 DIAGNOSIS — Z8669 Personal history of other diseases of the nervous system and sense organs: Secondary | ICD-10-CM | POA: Insufficient documentation

## 2013-07-19 DIAGNOSIS — I10 Essential (primary) hypertension: Secondary | ICD-10-CM | POA: Insufficient documentation

## 2013-07-19 DIAGNOSIS — M7989 Other specified soft tissue disorders: Secondary | ICD-10-CM

## 2013-07-19 DIAGNOSIS — Z87440 Personal history of urinary (tract) infections: Secondary | ICD-10-CM | POA: Insufficient documentation

## 2013-07-19 DIAGNOSIS — Z8619 Personal history of other infectious and parasitic diseases: Secondary | ICD-10-CM | POA: Insufficient documentation

## 2013-07-19 DIAGNOSIS — Z8719 Personal history of other diseases of the digestive system: Secondary | ICD-10-CM | POA: Insufficient documentation

## 2013-07-19 DIAGNOSIS — Z8739 Personal history of other diseases of the musculoskeletal system and connective tissue: Secondary | ICD-10-CM | POA: Insufficient documentation

## 2013-07-19 DIAGNOSIS — R609 Edema, unspecified: Secondary | ICD-10-CM | POA: Insufficient documentation

## 2013-07-19 DIAGNOSIS — J45909 Unspecified asthma, uncomplicated: Secondary | ICD-10-CM | POA: Insufficient documentation

## 2013-07-19 DIAGNOSIS — Z79899 Other long term (current) drug therapy: Secondary | ICD-10-CM | POA: Insufficient documentation

## 2013-07-19 DIAGNOSIS — R600 Localized edema: Secondary | ICD-10-CM

## 2013-07-19 DIAGNOSIS — D649 Anemia, unspecified: Secondary | ICD-10-CM | POA: Insufficient documentation

## 2013-07-19 LAB — COMPREHENSIVE METABOLIC PANEL
ALT: 13 U/L (ref 0–53)
AST: 16 U/L (ref 0–37)
Albumin: 3.2 g/dL — ABNORMAL LOW (ref 3.5–5.2)
Alkaline Phosphatase: 98 U/L (ref 39–117)
BUN: 26 mg/dL — ABNORMAL HIGH (ref 6–23)
CO2: 25 mEq/L (ref 19–32)
Calcium: 9.6 mg/dL (ref 8.4–10.5)
Chloride: 102 mEq/L (ref 96–112)
Creatinine, Ser: 0.75 mg/dL (ref 0.50–1.35)
GFR calc Af Amer: >90 mL/min (ref >90)
GFR calc non Af Amer: >90 mL/min (ref >90)
Glucose, Bld: 89 mg/dL (ref 70–99)
Potassium: 4.1 mEq/L (ref 3.7–5.3)
Sodium: 140 mEq/L (ref 137–147)
Total Bilirubin: <0.2 mg/dL — ABNORMAL LOW (ref 0.3–1.2)
Total Protein: 8.5 g/dL — ABNORMAL HIGH (ref 6.0–8.3)

## 2013-07-19 LAB — URINE MICROSCOPIC-ADD ON

## 2013-07-19 LAB — URINALYSIS, ROUTINE W REFLEX MICROSCOPIC
Bilirubin Urine: NEGATIVE
Glucose, UA: NEGATIVE mg/dL
Hgb urine dipstick: NEGATIVE
Ketones, ur: NEGATIVE mg/dL
Nitrite: NEGATIVE
Protein, ur: NEGATIVE mg/dL
Specific Gravity, Urine: 1.014 (ref 1.005–1.030)
Urobilinogen, UA: 0.2 mg/dL (ref 0.0–1.0)
pH: 5 (ref 5.0–8.0)

## 2013-07-19 LAB — CBC
HCT: 29.6 % — ABNORMAL LOW (ref 39.0–52.0)
Hemoglobin: 9.7 g/dL — ABNORMAL LOW (ref 13.0–17.0)
MCH: 25.5 pg — ABNORMAL LOW (ref 26.0–34.0)
MCHC: 32.8 g/dL (ref 30.0–36.0)
MCV: 77.9 fL — ABNORMAL LOW (ref 78.0–100.0)
Platelets: 371 10*3/uL (ref 150–400)
RBC: 3.8 MIL/uL — ABNORMAL LOW (ref 4.22–5.81)
RDW: 17.1 % — ABNORMAL HIGH (ref 11.5–15.5)
WBC: 11.6 10*3/uL — ABNORMAL HIGH (ref 4.0–10.5)

## 2013-07-19 LAB — PRO B NATRIURETIC PEPTIDE: Pro B Natriuretic peptide (BNP): 79.5 pg/mL (ref 0–125)

## 2013-07-19 NOTE — Progress Notes (Signed)
*  PRELIMINARY RESULTS* Vascular Ultrasound Right lower extremity venous duplex has been completed.  Preliminary findings: No evidence of DVT.   Landry Mellow, RDMS, RVT  07/19/2013, 6:25 PM

## 2013-07-19 NOTE — ED Notes (Signed)
Pt reports having right leg swelling that began a few weeks ago. Pt was seen by his PCP and referred to ED to have a ultrasound of the right lower leg due to unilateral swelling and the possibility of a clot. Pt is A/O x4, in NAD, and vitals are WDL.
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
## 2013-07-19 NOTE — ED Notes (Signed)
Waiting on Pump and Channel to access PICC line.

## 2013-07-19 NOTE — Discharge Instructions (Signed)
I would increase your Lasix to 60 mg a day for the next 3 days and follow up with your primary care doctor on Monday.  Return here as needed

## 2013-07-21 NOTE — ED Provider Notes (Signed)
Medical screening examination/treatment/procedure(s) were performed by non-physician practitioner and as supervising physician I was immediately available for consultation/collaboration.   Babette Relic, MD 07/21/13 1332

## 2013-07-29 ENCOUNTER — Encounter: Payer: Self-pay | Admitting: Infectious Diseases

## 2013-07-29 ENCOUNTER — Telehealth: Payer: Self-pay | Admitting: *Deleted

## 2013-07-29 ENCOUNTER — Ambulatory Visit (INDEPENDENT_AMBULATORY_CARE_PROVIDER_SITE_OTHER): Payer: Medicare Other | Admitting: Infectious Diseases

## 2013-07-29 VITALS — BP 123/77 | HR 101 | Temp 98.3°F

## 2013-07-29 DIAGNOSIS — L8994 Pressure ulcer of unspecified site, stage 4: Secondary | ICD-10-CM

## 2013-07-29 DIAGNOSIS — L89109 Pressure ulcer of unspecified part of back, unspecified stage: Secondary | ICD-10-CM

## 2013-07-29 DIAGNOSIS — L89154 Pressure ulcer of sacral region, stage 4: Secondary | ICD-10-CM

## 2013-07-29 NOTE — Assessment & Plan Note (Signed)
He appears to be doing well. Will continue him on his anbx through his f/u visit with Dr [name]. I offered to keep him on anbx til he has surgery, he is unsure when that might be.  Will call [facility] to lengthen his anbx course.  Will see him back in 6 weeks.

## 2013-07-29 NOTE — Progress Notes (Signed)
Subjective:    Patient ID: [name] [name], male    DOB: 1966-03-19, 48 y.o.   MRN: [id number]  HPI 48 y.o. M with paraplegia (C5 fracture 1991) and nonhealing sacral wound for the past 3 years. He has had multiple prolonged courses of antibiotic therapy recently. He was hospitalized on March 19 right hip osteomyelitis. Was transferred to [facility] where a hip aspirate grew methicillin sensitive coagulase-negative staph and Candida. He received a long course of IV ertapenem and oral fluconazole. He was readmitted from April 19-25 and switched to vancomycin and ertapenem and received continued therapy. He was admitted again from June 25 to July 2. Bone biopsy grew Enterobacter and he was started on ertapenem again received 8 weeks of therapy completing that on September 1st. He was seen by Dr. Megan Salon and Dr. Baxter Flattery and they felt more likely admission complaints of fever, etc were related to pseudomonas UTI rather than his chronic pelvic infection.  He then returned 02-20-13 with fevers. He was found on CT to have an abscess 6.8 x 3.6 x 5.9 c with gas in hip joint, left side, with no changes in chronic right sacral decub and air tracking to right hip joint. He had urine and blood cultures drawn and placed on vancomycin and imipenem.  His UCx grew > 100k E cloacae (S-aminoglycosides, FLQ, Bactrim) for which he received fosfomycin.  His abscess was aspirated by IR on 02-22-13 and grew E coli (R flq and bactrim). He was treated with IV ceftriaxone and oral flagyl with a plan for 6 weeks of therapy (end Nov 19).  He was seen in ID clinic in f/u 03-25-13. He was doing well.  He was seen in ID clinic 04-24-13 with concerns about increasing wound d/c. He was sent to [facility], not restated on anbx.  Was seen in ID on 05-27-13, he was concerned about increasing wound d/c. He was kept off anbx. He continued to have f/u with [facility].  On 06-21-13 he was admitted to the hospital with fever. He  was started on vanco/zosyn. He underwent MRI: 1. There are chronic destructive osseous changes of bilateral hips with irregular soft tissue enhancement within bilateral hip joints and adjacent fluid collections. There is no significant marrow signal abnormality. The overall appearance is consistent with chronic osteomyelitis with the fluid collections concerning for abscesses. Overall there is no significant changes in the appearance of bilateral hips compared with the CT of the pelvis performed on 02/27/2013. 2. There is a sacral decubitus ulcer overlying the left SI joint with mild marrow edema of the posterior left sacrum and ilium with enhancement concerning for osteomyelitis. He underwent IR drain of his fluid collection, Cx was (-). He had UCx which grew Providencia, Pseudomonas. He was d/c home on 2-16.  Plan for anbx to stop on 08-01-13. No problems with PIC (ossas resistance with flushing).  Has been feeling back baseline. No further f/c. No problems with his suprapubic catheter. Feels like his wound is draining less now that he is on anbx. Still has f/u at [facility]- is considering surgery to close wound.    Review of Systems  Constitutional: Negative for fever and chills.  Gastrointestinal: Negative for diarrhea and constipation.  Genitourinary: Negative for difficulty urinating.       Objective:   Physical Exam  Constitutional: He appears well-developed and well-nourished.  Pulmonary/Chest:    Musculoskeletal: He exhibits edema.  Skin:  Assessment & Plan:

## 2013-07-29 NOTE — Telephone Encounter (Signed)
Verbal order per [name] to extend IV antibiotics until 08/13/23. They will check with this office on that date. [name] wanted to continue meds until patient's surgery and patient does not have a date for this yet. [name]

## 2013-07-31 ENCOUNTER — Emergency Department (HOSPITAL_COMMUNITY)
Admission: EM | Admit: 2013-07-31 | Discharge: 2013-07-31 | Disposition: A | Discharge status: Home / Self Care | Payer: Medicare Other | Attending: Emergency Medicine | Admitting: Emergency Medicine

## 2013-07-31 ENCOUNTER — Encounter (HOSPITAL_COMMUNITY): Payer: Self-pay | Admitting: Emergency Medicine

## 2013-07-31 DIAGNOSIS — Z79899 Other long term (current) drug therapy: Secondary | ICD-10-CM | POA: Insufficient documentation

## 2013-07-31 DIAGNOSIS — Z8781 Personal history of (healed) traumatic fracture: Secondary | ICD-10-CM | POA: Insufficient documentation

## 2013-07-31 DIAGNOSIS — Z872 Personal history of diseases of the skin and subcutaneous tissue: Secondary | ICD-10-CM | POA: Insufficient documentation

## 2013-07-31 DIAGNOSIS — Z8719 Personal history of other diseases of the digestive system: Secondary | ICD-10-CM | POA: Insufficient documentation

## 2013-07-31 DIAGNOSIS — Y849 Medical procedure, unspecified as the cause of abnormal reaction of the patient, or of later complication, without mention of misadventure at the time of the procedure: Secondary | ICD-10-CM | POA: Insufficient documentation

## 2013-07-31 DIAGNOSIS — T82598A Other mechanical complication of other cardiac and vascular devices and implants, initial encounter: Secondary | ICD-10-CM | POA: Insufficient documentation

## 2013-07-31 DIAGNOSIS — Z8669 Personal history of other diseases of the nervous system and sense organs: Secondary | ICD-10-CM | POA: Insufficient documentation

## 2013-07-31 DIAGNOSIS — J45909 Unspecified asthma, uncomplicated: Secondary | ICD-10-CM | POA: Insufficient documentation

## 2013-07-31 DIAGNOSIS — T82898A Other specified complication of vascular prosthetic devices, implants and grafts, initial encounter: Secondary | ICD-10-CM

## 2013-07-31 DIAGNOSIS — Z8619 Personal history of other infectious and parasitic diseases: Secondary | ICD-10-CM | POA: Insufficient documentation

## 2013-07-31 DIAGNOSIS — Z862 Personal history of diseases of the blood and blood-forming organs and certain disorders involving the immune mechanism: Secondary | ICD-10-CM | POA: Insufficient documentation

## 2013-07-31 DIAGNOSIS — Z8739 Personal history of other diseases of the musculoskeletal system and connective tissue: Secondary | ICD-10-CM | POA: Insufficient documentation

## 2013-07-31 DIAGNOSIS — J9610 Chronic respiratory failure, unspecified whether with hypoxia or hypercapnia: Secondary | ICD-10-CM | POA: Insufficient documentation

## 2013-07-31 DIAGNOSIS — Z87440 Personal history of urinary (tract) infections: Secondary | ICD-10-CM | POA: Insufficient documentation

## 2013-07-31 DIAGNOSIS — I10 Essential (primary) hypertension: Secondary | ICD-10-CM | POA: Insufficient documentation

## 2013-07-31 NOTE — ED Notes (Signed)
Pt sent by home health nurse due to PICC line not returning blood. Pt states it flushes but no blood return.
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
## 2013-07-31 NOTE — Discharge Instructions (Signed)
Return to the ED with any concerns including fever, erythema around PICC site, not able to flush or use PICC line, or any other alarming symptoms

## 2013-07-31 NOTE — Progress Notes (Signed)
R tunneled PICC assessed. Flushed 10cc NS, good blood return/10cc waste. No abnormalities noted. Lorri Frederick, RN IV Team

## 2013-08-12 ENCOUNTER — Encounter (HOSPITAL_BASED_OUTPATIENT_CLINIC_OR_DEPARTMENT_OTHER): Payer: Medicare Other

## 2013-08-12 ENCOUNTER — Encounter (HOSPITAL_BASED_OUTPATIENT_CLINIC_OR_DEPARTMENT_OTHER): Payer: Medicare Other | Attending: Plastic Surgery

## 2013-08-12 DIAGNOSIS — L98499 Non-pressure chronic ulcer of skin of other sites with unspecified severity: Secondary | ICD-10-CM | POA: Insufficient documentation

## 2013-08-13 ENCOUNTER — Telehealth: Payer: Self-pay | Admitting: *Deleted

## 2013-08-13 NOTE — Telephone Encounter (Addendum)
Patient out of antibiotics, completed 3/30.  Per chart review, patient will be continued on IV ABX until surgery.  Amy will contact patient to get his surgery date.   Landis Gandy, RN

## 2013-08-13 NOTE — Progress Notes (Signed)
[facility]  NAME:  [name] [name], [name] [name]                      ACCOUNT NO.:  MEDICAL RECORD NO.:  [id number]      DATE OF BIRTH:  12/26/1965  PHYSICIAN:  [name], DO       VISIT DATE:  08/12/2013                                  OFFICE VISIT   HISTORY OF PRESENT ILLNESS:  The patient is a very pleasant 48 year old African American male, who is here for followup on his sacral ischial ulcers.  He does have some live-in health and has been having home health come several times a week.  He was told home health will no longer be able to come, he has been doing wet-to-dry dressings, and says that he is doing the boost shakes at home.  He is pleasant as usual, and very nice.  REVIEW OF SYSTEMS:  He had a PICC line incident and was seen in the ER, but otherwise negative.  No change in social history other than the home health.  PHYSICAL EXAMINATION:  GENERAL:  He is alert, oriented, and cooperative. His usual.  He is little diaphoretic, but afebrile and this is baseline for him. HEENT:  His pupils are equal. LUNGS:  His breathing is unlabored. CARDIAC:  Heart rate is regular.  ASSESSMENT AND PLAN:  The wound sizes are noted in the chart.  They do appear to be a little bit bigger than they were previously.  I have planned to check a prealbumin.  Continue with wet-to-dry dressings, offload multivitamin, vitamin C, zinc, and protein intake.  I am little concerned about him and not having home health, and so I have instructed him to call me if anything changes.     [name], DO     CS/MEDQ  D:  08/12/2013  T:  08/12/2013  Job:  [id number]

## 2013-08-13 NOTE — Telephone Encounter (Signed)
Amy contacted patient.  Per patient, it was decided at his wound care appointment yesterday that there will be no surgery.  Please advise if patient should continue antibiotics, or if he should be observed off treatment.  Pt f/u 4/27 with you.

## 2013-08-14 ENCOUNTER — Telehealth: Payer: Self-pay | Admitting: *Deleted

## 2013-08-14 ENCOUNTER — Other Ambulatory Visit: Payer: Self-pay | Admitting: *Deleted

## 2013-08-14 DIAGNOSIS — L89109 Pressure ulcer of unspecified part of back, unspecified stage: Secondary | ICD-10-CM

## 2013-08-14 MED ORDER — DOXYCYCLINE HYCLATE 100 MG PO TABS: 100.0000 mg | ORAL_TABLET | Freq: Two times a day (BID) | ORAL | Status: DC

## 2013-08-14 NOTE — Telephone Encounter (Signed)
Can you put him on doxy 100mg  bid til he has f/u? thnaks

## 2013-08-14 NOTE — Telephone Encounter (Signed)
Gave verbal order to pull patient's PICC to [facility].  They will send nursing out to do so. Landis Gandy, RN

## 2013-08-15 ENCOUNTER — Other Ambulatory Visit: Payer: Self-pay | Admitting: *Deleted

## 2013-08-15 DIAGNOSIS — L89154 Pressure ulcer of sacral region, stage 4: Secondary | ICD-10-CM

## 2013-08-20 ENCOUNTER — Ambulatory Visit (HOSPITAL_COMMUNITY)
Admission: RE | Admit: 2013-08-20 | Discharge: 2013-08-20 | Disposition: A | Discharge status: Home / Self Care | Payer: Medicare Other | Source: Ambulatory Visit | Attending: Infectious Diseases | Admitting: Infectious Diseases

## 2013-08-20 DIAGNOSIS — Z452 Encounter for adjustment and management of vascular access device: Secondary | ICD-10-CM | POA: Insufficient documentation

## 2013-08-20 DIAGNOSIS — L89154 Pressure ulcer of sacral region, stage 4: Secondary | ICD-10-CM

## 2013-08-26 ENCOUNTER — Ambulatory Visit: Payer: Medicare Other | Admitting: Infectious Diseases

## 2013-09-05 ENCOUNTER — Encounter: Payer: Self-pay | Admitting: Infectious Disease

## 2013-09-09 ENCOUNTER — Telehealth: Payer: Self-pay | Admitting: *Deleted

## 2013-09-09 ENCOUNTER — Ambulatory Visit: Payer: Medicare Other | Admitting: Infectious Diseases

## 2013-09-09 DIAGNOSIS — L89109 Pressure ulcer of unspecified part of back, unspecified stage: Secondary | ICD-10-CM

## 2013-09-09 MED ORDER — DOXYCYCLINE HYCLATE 100 MG PO TABS: 100.0000 mg | ORAL_TABLET | Freq: Two times a day (BID) | ORAL | Status: DC

## 2013-09-09 NOTE — Telephone Encounter (Signed)
Patient will not be coming today.  Patient has sought second opinion from [facility], will be pursuing SNF placement for treatment of his wound.  Per [name], will give another 1 month of doxycycline to continue his antibiotic coverage through placement.  At that time, wound care and antibiotic therapy will be continued by [facility] Landis Gandy, RN

## 2013-09-16 ENCOUNTER — Encounter (HOSPITAL_BASED_OUTPATIENT_CLINIC_OR_DEPARTMENT_OTHER): Payer: Medicare Other | Attending: Plastic Surgery

## 2013-09-16 DIAGNOSIS — L98499 Non-pressure chronic ulcer of skin of other sites with unspecified severity: Secondary | ICD-10-CM | POA: Insufficient documentation

## 2013-09-16 DIAGNOSIS — L97809 Non-pressure chronic ulcer of other part of unspecified lower leg with unspecified severity: Secondary | ICD-10-CM | POA: Insufficient documentation

## 2013-09-17 NOTE — Progress Notes (Signed)
[facility]  NAME:  [name] [name], [name] [name] NO.:  [id number]  MEDICAL RECORD NO.:  [id number]      DATE OF BIRTH:  [birth year]  PHYSICIAN:  [name], DO       VISIT DATE:  09/16/2013                                  OFFICE VISIT   The patient is a 48 year old black male, who is here for followup on his sacral ulcers and leg ulcer.  He has a new shoulder ulcer as well.  His caregiver states he obtained when he was transferred and obtained a blister.  She says she is doing wet-to-dry dressings with Dakin's on the sacral area.  Home health is no longer coming.  His antibiotics have been discontinued.  His review of systems is otherwise negative.  No change in his social history except for no home health.  On exam, he is alert, oriented, and cooperative.  He is very pleasant like usual.  His pupils are equal.  His breathing is unlabored.  His heart rate is regular.  His sacral area has multiple ulcers and some of which were debrided and that is noted in the chart and that was done sharply with a 10-blade.  Hemostasis was achieved with the silver nitrate.  The shoulder was debrided as well.  The eschar was debrided off.  The recommendation is for Santyl on the leg, Silvadene on the shoulder and wet-to-dry dressings on the sacral area twice a day if possible. Continue offloading, check a prealbumin, multivitamin, vitamin C, and zinc should be given.  Will follow up in a month.     [name], DO     CS/MEDQ  D:  09/16/2013  T:  09/17/2013  Job:  [id number]

## 2013-09-23 ENCOUNTER — Encounter (HOSPITAL_COMMUNITY): Payer: Self-pay | Admitting: Emergency Medicine

## 2013-09-23 ENCOUNTER — Emergency Department (HOSPITAL_COMMUNITY): Payer: Medicare Other

## 2013-09-23 ENCOUNTER — Inpatient Hospital Stay (HOSPITAL_COMMUNITY)
Admission: EM | Admit: 2013-09-23 | Discharge: 2013-09-28 | DRG: 871 | Disposition: A | Discharge status: Home Health | Payer: Medicare Other | Attending: Internal Medicine | Admitting: Internal Medicine

## 2013-09-23 ENCOUNTER — Inpatient Hospital Stay (HOSPITAL_COMMUNITY): Payer: Medicare Other

## 2013-09-23 DIAGNOSIS — E669 Obesity, unspecified: Secondary | ICD-10-CM

## 2013-09-23 DIAGNOSIS — N390 Urinary tract infection, site not specified: Secondary | ICD-10-CM

## 2013-09-23 DIAGNOSIS — I959 Hypotension, unspecified: Secondary | ICD-10-CM

## 2013-09-23 DIAGNOSIS — A419 Sepsis, unspecified organism: Principal | ICD-10-CM

## 2013-09-23 DIAGNOSIS — L89109 Pressure ulcer of unspecified part of back, unspecified stage: Secondary | ICD-10-CM | POA: Diagnosis present

## 2013-09-23 DIAGNOSIS — J9610 Chronic respiratory failure, unspecified whether with hypoxia or hypercapnia: Secondary | ICD-10-CM | POA: Diagnosis present

## 2013-09-23 DIAGNOSIS — G8250 Quadriplegia, unspecified: Secondary | ICD-10-CM

## 2013-09-23 DIAGNOSIS — A4902 Methicillin resistant Staphylococcus aureus infection, unspecified site: Secondary | ICD-10-CM | POA: Diagnosis present

## 2013-09-23 DIAGNOSIS — D509 Iron deficiency anemia, unspecified: Secondary | ICD-10-CM

## 2013-09-23 DIAGNOSIS — I739 Peripheral vascular disease, unspecified: Secondary | ICD-10-CM

## 2013-09-23 DIAGNOSIS — L89154 Pressure ulcer of sacral region, stage 4: Secondary | ICD-10-CM

## 2013-09-23 DIAGNOSIS — E46 Unspecified protein-calorie malnutrition: Secondary | ICD-10-CM

## 2013-09-23 DIAGNOSIS — E871 Hypo-osmolality and hyponatremia: Secondary | ICD-10-CM

## 2013-09-23 DIAGNOSIS — M86659 Other chronic osteomyelitis, unspecified thigh: Secondary | ICD-10-CM

## 2013-09-23 DIAGNOSIS — E43 Unspecified severe protein-calorie malnutrition: Secondary | ICD-10-CM

## 2013-09-23 DIAGNOSIS — G40909 Epilepsy, unspecified, not intractable, without status epilepticus: Secondary | ICD-10-CM

## 2013-09-23 DIAGNOSIS — L8994 Pressure ulcer of unspecified site, stage 4: Secondary | ICD-10-CM | POA: Diagnosis present

## 2013-09-23 DIAGNOSIS — Z79899 Other long term (current) drug therapy: Secondary | ICD-10-CM

## 2013-09-23 DIAGNOSIS — J45909 Unspecified asthma, uncomplicated: Secondary | ICD-10-CM | POA: Diagnosis present

## 2013-09-23 DIAGNOSIS — Z981 Arthrodesis status: Secondary | ICD-10-CM

## 2013-09-23 DIAGNOSIS — I10 Essential (primary) hypertension: Secondary | ICD-10-CM

## 2013-09-23 DIAGNOSIS — J189 Pneumonia, unspecified organism: Secondary | ICD-10-CM

## 2013-09-23 DIAGNOSIS — D638 Anemia in other chronic diseases classified elsewhere: Secondary | ICD-10-CM | POA: Diagnosis present

## 2013-09-23 DIAGNOSIS — D649 Anemia, unspecified: Secondary | ICD-10-CM

## 2013-09-23 DIAGNOSIS — Z933 Colostomy status: Secondary | ICD-10-CM

## 2013-09-23 DIAGNOSIS — Z9989 Dependence on other enabling machines and devices: Secondary | ICD-10-CM

## 2013-09-23 DIAGNOSIS — Z9359 Other cystostomy status: Secondary | ICD-10-CM

## 2013-09-23 DIAGNOSIS — G4733 Obstructive sleep apnea (adult) (pediatric): Secondary | ICD-10-CM

## 2013-09-23 DIAGNOSIS — D72829 Elevated white blood cell count, unspecified: Secondary | ICD-10-CM

## 2013-09-23 LAB — BASIC METABOLIC PANEL
BUN: 25 mg/dL — ABNORMAL HIGH (ref 6–23)
CO2: 26 meq/L (ref 19–32)
Calcium: 9.1 mg/dL (ref 8.4–10.5)
Chloride: 102 mEq/L (ref 96–112)
Creatinine, Ser: 0.54 mg/dL (ref 0.50–1.35)
GFR calc Af Amer: >90 mL/min (ref >90)
GFR calc non Af Amer: >90 mL/min (ref >90)
GLUCOSE: 110 mg/dL — AB (ref 70–99)
POTASSIUM: 4.4 meq/L (ref 3.7–5.3)
SODIUM: 142 meq/L (ref 137–147)

## 2013-09-23 LAB — TYPE AND SCREEN
ABO/RH(D): B POS
ANTIBODY SCREEN: NEGATIVE

## 2013-09-23 LAB — URINALYSIS, ROUTINE W REFLEX MICROSCOPIC
Bilirubin Urine: NEGATIVE
GLUCOSE, UA: NEGATIVE mg/dL
Ketones, ur: NEGATIVE mg/dL
Nitrite: POSITIVE — AB
Protein, ur: 30 mg/dL — AB
SPECIFIC GRAVITY, URINE: 1.017 (ref 1.005–1.030)
Urobilinogen, UA: 0.2 mg/dL (ref 0.0–1.0)
pH: 8.5 — ABNORMAL HIGH (ref 5.0–8.0)

## 2013-09-23 LAB — CBC
HEMATOCRIT: 20.7 % — AB (ref 39.0–52.0)
Hemoglobin: 6.1 g/dL — CL (ref 13.0–17.0)
MCH: 22.8 pg — ABNORMAL LOW (ref 26.0–34.0)
MCHC: 29.5 g/dL — ABNORMAL LOW (ref 30.0–36.0)
MCV: 77.2 fL — ABNORMAL LOW (ref 78.0–100.0)
Platelets: 402 10*3/uL — ABNORMAL HIGH (ref 150–400)
RBC: 2.68 MIL/uL — ABNORMAL LOW (ref 4.22–5.81)
RDW: 16.5 % — AB (ref 11.5–15.5)
WBC: 7.4 10*3/uL (ref 4.0–10.5)

## 2013-09-23 LAB — CBC WITH DIFFERENTIAL/PLATELET
BASOS ABS: 0 10*3/uL (ref 0.0–0.1)
Basophils Relative: 0 % (ref 0–1)
Eosinophils Absolute: 0.4 10*3/uL (ref 0.0–0.7)
Eosinophils Relative: 4 % (ref 0–5)
HCT: 30 % — ABNORMAL LOW (ref 39.0–52.0)
Hemoglobin: 8.9 g/dL — ABNORMAL LOW (ref 13.0–17.0)
LYMPHS ABS: 2.2 10*3/uL (ref 0.7–4.0)
Lymphocytes Relative: 20 % (ref 12–46)
MCH: 23.1 pg — AB (ref 26.0–34.0)
MCHC: 29.7 g/dL — ABNORMAL LOW (ref 30.0–36.0)
MCV: 77.9 fL — ABNORMAL LOW (ref 78.0–100.0)
MONO ABS: 1.2 10*3/uL — AB (ref 0.1–1.0)
MONOS PCT: 11 % (ref 3–12)
Neutro Abs: 7.2 10*3/uL (ref 1.7–7.7)
Neutrophils Relative %: 65 % (ref 43–77)
PLATELETS: 583 10*3/uL — AB (ref 150–400)
RBC: 3.85 MIL/uL — ABNORMAL LOW (ref 4.22–5.81)
RDW: 16.6 % — AB (ref 11.5–15.5)
Smear Review: INCREASED
WBC: 11 10*3/uL — AB (ref 4.0–10.5)

## 2013-09-23 LAB — URINE MICROSCOPIC-ADD ON

## 2013-09-23 LAB — CREATININE, SERUM: Creatinine, Ser: 0.32 mg/dL — ABNORMAL LOW (ref 0.50–1.35)

## 2013-09-23 LAB — C-REACTIVE PROTEIN: CRP: 10.8 mg/dL — ABNORMAL HIGH (ref <0.60)

## 2013-09-23 MED ORDER — METOCLOPRAMIDE HCL 10 MG PO TABS
10.0000 mg | ORAL_TABLET | Freq: Three times a day (TID) | ORAL | Status: DC
  Administered 2013-09-24 – 2013-09-28 (×14): 10 mg via ORAL
  Filled 2013-09-23 (×18): qty 1

## 2013-09-23 MED ORDER — DOCUSATE SODIUM 100 MG PO CAPS
100.0000 mg | ORAL_CAPSULE | Freq: Two times a day (BID) | ORAL | Status: DC
  Administered 2013-09-24 – 2013-09-28 (×10): 100 mg via ORAL
  Filled 2013-09-23 (×11): qty 1

## 2013-09-23 MED ORDER — FE FUMARATE-B12-VIT C-FA-IFC PO CAPS
1.0000 | ORAL_CAPSULE | Freq: Every day | ORAL | Status: DC
  Administered 2013-09-24 – 2013-09-28 (×5): 1 via ORAL
  Filled 2013-09-23 (×5): qty 1

## 2013-09-23 MED ORDER — VANCOMYCIN HCL 10 G IV SOLR
1250.0000 mg | Freq: Two times a day (BID) | INTRAVENOUS | Status: DC
  Administered 2013-09-24 – 2013-09-27 (×6): 1250 mg via INTRAVENOUS
  Filled 2013-09-23 (×10): qty 1250

## 2013-09-23 MED ORDER — PIPERACILLIN-TAZOBACTAM 3.375 G IVPB
3.3750 g | Freq: Once | INTRAVENOUS | Status: AC
  Administered 2013-09-23: 3.375 g via INTRAVENOUS
  Filled 2013-09-23: qty 50

## 2013-09-23 MED ORDER — VANCOMYCIN HCL IN DEXTROSE 1-5 GM/200ML-% IV SOLN: 1000.0000 mg | Freq: Once | INTRAVENOUS | Status: DC

## 2013-09-23 MED ORDER — AMLODIPINE BESYLATE 5 MG PO TABS: 5.0000 mg | ORAL_TABLET | Freq: Every morning | ORAL | Status: DC

## 2013-09-23 MED ORDER — ADULT MULTIVITAMIN W/MINERALS CH
1.0000 | ORAL_TABLET | Freq: Every morning | ORAL | Status: DC
  Administered 2013-09-24 – 2013-09-28 (×5): 1 via ORAL
  Filled 2013-09-23 (×5): qty 1

## 2013-09-23 MED ORDER — POTASSIUM CHLORIDE CRYS ER 20 MEQ PO TBCR
20.0000 meq | EXTENDED_RELEASE_TABLET | Freq: Two times a day (BID) | ORAL | Status: DC
  Administered 2013-09-24 – 2013-09-28 (×10): 20 meq via ORAL
  Filled 2013-09-23 (×13): qty 1

## 2013-09-23 MED ORDER — FAMOTIDINE 20 MG PO TABS
20.0000 mg | ORAL_TABLET | Freq: Two times a day (BID) | ORAL | Status: DC
  Administered 2013-09-24 – 2013-09-28 (×9): 20 mg via ORAL
  Filled 2013-09-23 (×10): qty 1

## 2013-09-23 MED ORDER — SODIUM CHLORIDE 0.9 % IJ SOLN
10.0000 mL | INTRAMUSCULAR | Status: DC | PRN
  Administered 2013-09-26 – 2013-09-27 (×4): 10 mL
  Administered 2013-09-28: 20 mL

## 2013-09-23 MED ORDER — SODIUM CHLORIDE 0.9 % IV BOLUS (SEPSIS)
1000.0000 mL | Freq: Once | INTRAVENOUS | Status: AC
  Administered 2013-09-23: 1000 mL via INTRAVENOUS

## 2013-09-23 MED ORDER — ZINC SULFATE 220 (50 ZN) MG PO CAPS
220.0000 mg | ORAL_CAPSULE | Freq: Every morning | ORAL | Status: DC
  Administered 2013-09-24 – 2013-09-28 (×5): 220 mg via ORAL
  Filled 2013-09-23 (×5): qty 1

## 2013-09-23 MED ORDER — COLLAGENASE 250 UNIT/GM EX OINT: 1.0000 "application " | TOPICAL_OINTMENT | Freq: Every day | CUTANEOUS | Status: DC | PRN

## 2013-09-23 MED ORDER — HEPARIN SODIUM (PORCINE) 5000 UNIT/ML IJ SOLN: 5000.0000 [IU] | Freq: Three times a day (TID) | INTRAMUSCULAR | Status: DC

## 2013-09-23 MED ORDER — GADOBENATE DIMEGLUMINE 529 MG/ML IV SOLN
20.0000 mL | Freq: Once | INTRAVENOUS | Status: AC
  Administered 2013-09-23: 20 mL via INTRAVENOUS

## 2013-09-23 MED ORDER — BACLOFEN 20 MG PO TABS
20.0000 mg | ORAL_TABLET | Freq: Four times a day (QID) | ORAL | Status: DC
  Administered 2013-09-24 – 2013-09-28 (×19): 20 mg via ORAL
  Filled 2013-09-23 (×21): qty 1

## 2013-09-23 MED ORDER — SODIUM CHLORIDE 0.9 % IV SOLN
INTRAVENOUS | Status: DC
  Administered 2013-09-24 – 2013-09-25 (×2): via INTRAVENOUS

## 2013-09-23 NOTE — ED Provider Notes (Signed)
I saw and evaluated the patient, reviewed the resident's note and I agree with the findings and plan. If applicable, I agree with the resident's interpretation of the EKG.  If applicable, I was present for critical portions of any procedures performed.  Increased drainage from chronic sacral wound.  No fever.  Appears nontoxic.  Wound mostly clean based with areas of tunneling and granulation tissue.  Bone visible. See photos. Plan to obtain imaging to evaluate acute versus chronic and any fluid collections. Per PICC nurse, patient needs to go IR for PICC placement.  Ezequiel Essex, MD 09/23/13 1730

## 2013-09-23 NOTE — ED Notes (Signed)
Attempted to call report

## 2013-09-23 NOTE — ED Notes (Signed)
Patient back from xray and fluids stopped for further blood draw from PICC

## 2013-09-23 NOTE — ED Notes (Signed)
MD at bedside to place IV

## 2013-09-23 NOTE — ED Notes (Signed)
Pt is in MRI  
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
## 2013-09-23 NOTE — ED Notes (Signed)
Pt has pressure ulcer on entire buttocks. Per caregiver, the drainage has changed. Pt is quadriplegic. BP 132/76.

## 2013-09-23 NOTE — Procedures (Signed)
Procedure:  Tunneled CVC placement Access:  Right IJ vein Tunneled 6 Fr, DL Power Line placed with tip at cavoatrial junction.  OK to use.

## 2013-09-23 NOTE — Progress Notes (Signed)
For tunneled IJ CVC today.

## 2013-09-23 NOTE — ED Notes (Signed)
Pt has pressure ulcer over entire buttocks and small wound on back of scrotum. Tunneling areas- deepest area on right buttocks measures 7 1/2 cm deep. Wound has some slough, mostly granulating tissue, no eschar. Pt denies any pain.

## 2013-09-23 NOTE — ED Notes (Signed)
Pt still in IR.

## 2013-09-23 NOTE — ED Notes (Signed)
Paged IV team for IV access.

## 2013-09-23 NOTE — ED Notes (Signed)
Dr. Ernestina Patches notified of Hgb 6.1 orders received.

## 2013-09-23 NOTE — ED Notes (Signed)
Dressing was taken down and wound inspected by MD. RN redressed wound and packed tunneling areas.

## 2013-09-23 NOTE — ED Notes (Signed)
MD at bedside. 

## 2013-09-23 NOTE — ED Notes (Signed)
Md unsuccessful with IV start using ultrasound. Pt will still need to go to IR. Called MRI to check on status of pt being able to be seen. MRI states he will be seen at 1600
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
## 2013-09-23 NOTE — ED Notes (Signed)
Gave pt gingerale and peanutbutter crackers
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
## 2013-09-23 NOTE — Evaluation (Signed)
Received call from nurse above patient with a stat hemoglobin level of 6.1. Initial hemoglobin was 8.9 presentation which is near patient's baseline. We'll check stat Hemoccult. We'll hold heparin. Recheck stat hemoglobin to make sure this is accurate.type and screen.

## 2013-09-23 NOTE — ED Notes (Signed)
Pt will be going to IR to have IV placed.

## 2013-09-23 NOTE — Progress Notes (Signed)
  Subjective: Quadriplegic Osteomyelitis Buttock and scrotal pressure wounds Being admitted through ED Difficult stick and needs several weeks of antibiotics Scheduled now for tunneled central catheter placement  Objective: Vital signs in last 24 hours: Temp:  [98.2 F (36.8 C)] 98.2 F (36.8 C) (05/11 1014) Pulse Rate:  [74-98] 74 (05/11 1330) Resp:  [16] 16 (05/11 1014) BP: (104-148)/(48-89) 123/83 mmHg (05/11 1330) SpO2:  [94 %-100 %] 100 % (05/11 1330) Weight:  [104.327 kg (230 lb)] 104.327 kg (230 lb) (05/11 1014)    Intake/Output from previous day:   Intake/Output this shift:    PE: in NAD Quadriplegia Uses left hand minimally A/O  Lab Results:   Recent Labs  09/23/13 1117  WBC 11.0*  HGB 8.9*  HCT 30.0*  PLT 583*   BMET  Recent Labs  09/23/13 1117  NA 142  K 4.4  CL 102  CO2 26  GLUCOSE 110*  BUN 25*  CREATININE 0.54  CALCIUM 9.1   PT/INR No results found for this basename: LABPROT, INR,  in the last 72 hours ABG No results found for this basename: PHART, PCO2, PO2, HCO3,  in the last 72 hours  Studies/Results: No results found.  Anti-infectives: Anti-infectives   None      Assessment/Plan: s/p * No surgery found *  Osteomyelitis Buttock and scrotal wounds Admit for IV antibiotic treatment Scheduled for tunneled central catheter placement Pt aware of procedure benefits and risks and agreeable to proceed Consent signed and in chart   LOS: 0 days    Lavonia Drafts 09/23/2013

## 2013-09-23 NOTE — ED Provider Notes (Signed)
Patient will be admitted to the hospital for further evaluation and care.  He was given IV antibiotics.  For his urinary tract infection.  I spoke with the, [facility] will be down to admit the patient  Brent General, PA-C 09/23/13 2049

## 2013-09-23 NOTE — ED Notes (Signed)
Pt now in IR

## 2013-09-24 DIAGNOSIS — G40909 Epilepsy, unspecified, not intractable, without status epilepticus: Secondary | ICD-10-CM

## 2013-09-24 DIAGNOSIS — L8994 Pressure ulcer of unspecified site, stage 4: Secondary | ICD-10-CM

## 2013-09-24 DIAGNOSIS — I10 Essential (primary) hypertension: Secondary | ICD-10-CM

## 2013-09-24 DIAGNOSIS — G4733 Obstructive sleep apnea (adult) (pediatric): Secondary | ICD-10-CM

## 2013-09-24 DIAGNOSIS — G8250 Quadriplegia, unspecified: Secondary | ICD-10-CM

## 2013-09-24 DIAGNOSIS — Z9359 Other cystostomy status: Secondary | ICD-10-CM

## 2013-09-24 DIAGNOSIS — D72829 Elevated white blood cell count, unspecified: Secondary | ICD-10-CM

## 2013-09-24 DIAGNOSIS — D649 Anemia, unspecified: Secondary | ICD-10-CM

## 2013-09-24 DIAGNOSIS — N390 Urinary tract infection, site not specified: Secondary | ICD-10-CM

## 2013-09-24 DIAGNOSIS — E871 Hypo-osmolality and hyponatremia: Secondary | ICD-10-CM

## 2013-09-24 DIAGNOSIS — L89109 Pressure ulcer of unspecified part of back, unspecified stage: Secondary | ICD-10-CM

## 2013-09-24 LAB — COMPREHENSIVE METABOLIC PANEL
ALBUMIN: 2.4 g/dL — AB (ref 3.5–5.2)
ALT: 11 U/L (ref 0–53)
AST: 13 U/L (ref 0–37)
Alkaline Phosphatase: 82 U/L (ref 39–117)
BUN: 16 mg/dL (ref 6–23)
CHLORIDE: 105 meq/L (ref 96–112)
CO2: 27 meq/L (ref 19–32)
CREATININE: 0.44 mg/dL — AB (ref 0.50–1.35)
Calcium: 8.6 mg/dL (ref 8.4–10.5)
GFR calc Af Amer: >90 mL/min (ref >90)
Glucose, Bld: 93 mg/dL (ref 70–99)
Potassium: 3.9 mEq/L (ref 3.7–5.3)
SODIUM: 144 meq/L (ref 137–147)
Total Bilirubin: <0.2 mg/dL — ABNORMAL LOW (ref 0.3–1.2)
Total Protein: 6.8 g/dL (ref 6.0–8.3)

## 2013-09-24 LAB — CBC WITH DIFFERENTIAL/PLATELET
BASOS PCT: 0 % (ref 0–1)
Basophils Absolute: 0 10*3/uL (ref 0.0–0.1)
EOS ABS: 0.4 10*3/uL (ref 0.0–0.7)
EOS PCT: 4 % (ref 0–5)
HCT: 24.4 % — ABNORMAL LOW (ref 39.0–52.0)
Hemoglobin: 7.3 g/dL — ABNORMAL LOW (ref 13.0–17.0)
LYMPHS ABS: 1.5 10*3/uL (ref 0.7–4.0)
Lymphocytes Relative: 18 % (ref 12–46)
MCH: 23.1 pg — AB (ref 26.0–34.0)
MCHC: 29.9 g/dL — AB (ref 30.0–36.0)
MCV: 77.2 fL — AB (ref 78.0–100.0)
Monocytes Absolute: 1 10*3/uL (ref 0.1–1.0)
Monocytes Relative: 11 % (ref 3–12)
Neutro Abs: 5.7 10*3/uL (ref 1.7–7.7)
Neutrophils Relative %: 67 % (ref 43–77)
PLATELETS: 519 10*3/uL — AB (ref 150–400)
RBC: 3.16 MIL/uL — AB (ref 4.22–5.81)
RDW: 16.4 % — ABNORMAL HIGH (ref 11.5–15.5)
WBC: 8.6 10*3/uL (ref 4.0–10.5)

## 2013-09-24 LAB — OCCULT BLOOD X 1 CARD TO LAB, STOOL: FECAL OCCULT BLD: NEGATIVE

## 2013-09-24 LAB — HEMOGLOBIN AND HEMATOCRIT, BLOOD
HCT: 24.4 % — ABNORMAL LOW (ref 39.0–52.0)
HEMATOCRIT: 32.2 % — AB (ref 39.0–52.0)
Hemoglobin: 9.7 g/dL — ABNORMAL LOW (ref 13.0–17.0)
Hemoglobin: 7.2 g/dL — ABNORMAL LOW (ref 13.0–17.0)

## 2013-09-24 LAB — MRSA PCR SCREENING: MRSA by PCR: NEGATIVE

## 2013-09-24 MED ORDER — COLLAGENASE 250 UNIT/GM EX OINT
1.0000 "application " | TOPICAL_OINTMENT | Freq: Every day | CUTANEOUS | Status: DC
  Administered 2013-09-24 – 2013-09-26 (×2): 1 via TOPICAL
  Filled 2013-09-24: qty 30

## 2013-09-24 MED ORDER — ENOXAPARIN SODIUM 60 MG/0.6ML ~~LOC~~ SOLN
60.0000 mg | Freq: Every day | SUBCUTANEOUS | Status: DC
  Administered 2013-09-25 – 2013-09-27 (×4): 60 mg via SUBCUTANEOUS
  Filled 2013-09-24 (×5): qty 0.6

## 2013-09-24 MED ORDER — PIPERACILLIN-TAZOBACTAM 3.375 G IVPB
3.3750 g | Freq: Three times a day (TID) | INTRAVENOUS | Status: DC
  Administered 2013-09-24 – 2013-09-27 (×9): 3.375 g via INTRAVENOUS
  Filled 2013-09-24 (×13): qty 50

## 2013-09-24 MED ORDER — VANCOMYCIN HCL 10 G IV SOLR
2000.0000 mg | Freq: Once | INTRAVENOUS | Status: AC
  Administered 2013-09-24: 2000 mg via INTRAVENOUS
  Filled 2013-09-24: qty 2000

## 2013-09-24 NOTE — Consult Note (Addendum)
WOC wound consult note Reason for Consult: Consult requested for buttocks and sacrum wounds.  Pt familiar to [place] team from multiple previous admissions.  He is followed as an outpatient by Dr [name] of the plastics team at the outpatient wound care center.  She recently assessed wounds last week, according to patient.  He began having increased odor and drainage from wounds this week.  He has a history of osteomyelitis which is beyond [facility] scope of practice.  Progress notes indicate ID consult is pending.  He describes "there were pieces of something when my dressing was changed at home.  Suspect these might be bone fragments R/T degeneration of hip bones, according to X-ray. Pt has unusual anatomical reference points and pink scar tissue surrounding wounds R/T previous flap surgery. Wound type: Multiple stage 4 wounds to left and right ischium, sacrum, and buttocks. Pressure Ulcer POA: Yes Measurement: Right posterior shoulder with full thickness wound; 3.5X3.5cm,100% yellow slough, mod amt yellow drainage, no odor. Right upper calf with full thickness wound; 2X.5X.2cm, 90% red, 10% yellow slough, small amt yellow drainage, no odor. Right lower calf with full thickness wound; 5X2cm. 90% yellow slough, 10% red, small amt yellow drainage, no odor. Right ischium 3X8X1.2cm stage 4 wound, tunneling to 4 cm at 9:00 o'clock.  100% beefy red with bone palpable, no odor, mod amt yellow drainage. Sacrum  18X7X3cm stage 4 wound, 90% red, 10% yellow, no odor, mod amt yellow drainage, bone visible Right ischium 6X2.5X1cm, stage 4 wound, 90% red, 10% yellow, no odor, mod amt yellow drainage, bone palpable. Coccyx 12.5X4X.5cm stage 4 wound with tunneling to 3 cm at 2:00 o'clock.  90% red, 10% yellow, no odor, mod amt yellow drainage, bone palpable.  Dressing procedure/placement/frequency: Pt has been using Santyl at home to right shoulder wound.  Continue Santyl for chemical debridement of nonviable tissue to posterior  right shoulder and  Begin to right lower leg.  Plastics team has ordered moist gauze packing to sacrum, buttocks, and left and right ischium wounds.  Continue present plan of care.  Pt on low air loss bed to reduce pressure.  He can resume follow-up with the outpatient wound care center after discharge. Colostomy and pouch intact with good seal.  Supplies ordered for bedside nurses to use.  One hour was spent on this consult. Please re-consult if further assistance is needed.  Thank-you,  Julien Girt MSN, [place], [place], [place], [place]

## 2013-09-24 NOTE — Progress Notes (Signed)
[facility] TEAM 1 - Stepdown/ICU TEAM Progress Note  Clyde Zarrella PJK:[id number] DOB: 1965/08/17 DOA: 09/23/2013 PCP: [name], MD  Admit HPI / Brief Narrative: 48 y.o. BM PMHx OSA on CPAP, HTN, seizure disorder, quadriplegia s/p C5 fracture about 23 years ago, stage IV sacral decubitus ulcer, recurrent osteomyelitis presenting with sepsis, acute on chronic osteomyelitis flare, UTI. Patient currently lives at home with in-house caregiver. Patient states that his caregiver noticed worsening drainage from his sacral decubiti ulcers with particular in the drainage. Caregiver was concerned about recurrence of infection. Patient states he did have some mild subjective fevers and chills last weekend, but none over the past 2-3 days. States he has had also had some off cough for the past week. No chest pain, dyspnea, increased work of breathing. Denies any other symptoms. Has no sensation below the mid chest.patient notes abdomen for similar symptoms February 2015.had a urinary tract infection and ruled out [place] he has Pseudomonas, both of which were sensitive to Zosyn. Is followed by infectious disease outpatient for chronic osteomyelitis.  Patient presents to the ER today.Afebrile. Blood pressure is lower limits of normal with systolic pressures in the 90s to 110s. Blood pressure seems to be trending with IV fluids.White blood cell count 11. Hemoglobin of 8.9.urinalysis indicative of infection.a repeat MRI shows very similar in appearance compared to last MRI October 2014.a tunnel central line was placed by interventional radiology for an IV antibiotics.Hospitalist requested for medical admission.    HPI/Subjective: 5/12 patient's issues appear to be all chronic, and at baseline.  Assessment/Plan: Sepsis/complicated UTI:  -Likely secondary to UTI from chronic indwelling suprapubic cath and possible acute or chronic osteomyelitis flare. -continue Zosyn + vancomycin -Diffuse treatment dependent  on cultures urine and blood  Sacral decubitus stage IV -Chronic; treatment per WOC  Quadriplegia:  -Continue baclofen.   Leukocytosis:  -Resolved  -Continue to follow   Anemia: -Patient's baseline hemoglobin between 6-9, currently at 7.2 -No active signs of bleeding. Continue to follow.   HTN:  -BP still soft continue to Hold Norvasc.   Chronic osteomyelitis No significant changes on MRI per report. See report below  Deconditioning -PT/OT consult for instructing patient and caregiver on range of motion exercises to maintain flexibility -OT; evaluate for any assistance devices which could help decrease pressure on chronic ulcers (standing left?)  OSA -CPAP per respiratory    Code Status: FULL Family Communication: no family present at time of exam Disposition Plan: Home   Consultants: NA  Procedure/Significant Events: PCXR 09/23/2013 Chronic changes without acute abnormality.  MRI pelvis with and without contrast 09/23/2013 -. Very similar appearance to prior exams back through 02/27/2013, including decubitus ulcer extending into the right hip joint ;  -decubitus ulcers extending to bone along both ischial tuberosities; decubitus ulcer extending to Lt side  sacrum;  -extensive bony destructive findings proximal femurs and adjacent acetabula 2dary chronic osteomyelitis; -chronic osteomyelitis along the left sacroiliac joint region ;  -chronic osteomyelitis of the ischial tuberosities; -scattered bony fragments in the left hip joint cavity.  -ulceration posterior to left hip joint     Culture 5/12 MRSA by PCR negative 5/11 urine culture pending 5/11 right hand x2 NTD (pending)    Antibiotics: Vancomycin 5/12>> Zosyn 5/12>>   DVT prophylaxis: Lovenox   Devices   LINES / TUBES:  5/11 tunneled CVC  via RIJ  ?/? Suprapubic cath     Continuous Infusions: . sodium chloride 100 mL/hr at 09/24/13 1200    Objective: VITAL SIGNS: Temp:  98 F (36.7  C) (05/12 1122) Temp src: Oral (05/12 1122) BP: 103/59 mmHg (05/12 1120) Pulse Rate: 98 (05/12 1122) SPO2; FIO2:   Intake/Output Summary (Last 24 hours) at 09/24/13 1311 Last data filed at 09/24/13 1200  Gross per 24 hour  Intake 1671.67 ml  Output    251 ml  Net 1420.67 ml     Exam: General: A./O. x4, NAD, No acute respiratory distress Lungs: Clear to auscultation bilaterally without wheezes or crackles Cardiovascular: Tachycardic, regular rhythm without murmur gallop or rub normal S1 and S2 Abdomen: Nontender,distended, soft, bowel sounds positive, no rebound, no ascites, no appreciable mass, suprapubic cath in place negative signs symptoms of infection. Extremities: Bilateral lower extremity contracted in the leg toward the right. Right posterior calf stage III decubiti covered and clean negative sign of infection Skin; bilateral stage IV sacral decubiti clear drainage covered and clean no sign of infection.  Data Reviewed: Basic Metabolic Panel:  Recent Labs Lab 09/23/13 1117 09/23/13 2142 09/24/13 0540  NA 142  --  144  K 4.4  --  3.9  CL 102  --  105  CO2 26  --  27  GLUCOSE 110*  --  93  BUN 25*  --  16  CREATININE 0.54 0.32* 0.44*  CALCIUM 9.1  --  8.6   Liver Function Tests:  Recent Labs Lab 09/24/13 0540  AST 13  ALT 11  ALKPHOS 82  BILITOT <0.2*  PROT 6.8  ALBUMIN 2.4*   No results found for this basename: LIPASE, AMYLASE,  in the last 168 hours No results found for this basename: AMMONIA,  in the last 168 hours CBC:  Recent Labs Lab 09/23/13 1117 09/23/13 2142 09/23/13 2255 09/24/13 0540  WBC 11.0* 7.4  --  8.6  NEUTROABS 7.2  --   --  5.7  HGB 8.9* 6.1* 9.7* 7.3*  HCT 30.0* 20.7* 32.2* 24.4*  MCV 77.9* 77.2*  --  77.2*  PLT 583* 402*  --  519*   Cardiac Enzymes: No results found for this basename: CKTOTAL, CKMB, CKMBINDEX, TROPONINI,  in the last 168 hours BNP (last 3 results)  Recent Labs  07/19/13 1938  PROBNP 79.5    CBG: No results found for this basename: GLUCAP,  in the last 168 hours  Recent Results (from the past 240 hour(s))  WOUND CULTURE     Status: None   Collection Time    09/23/13 10:50 AM      Result Value Ref Range Status   Specimen Description WOUND BUTTOCKS   Final   Special Requests NONE   Final   Gram Stain     Final   Value: NO WBC SEEN     NO SQUAMOUS EPITHELIAL CELLS SEEN     NO ORGANISMS SEEN     Performed at [facility]   Culture     Final   Value: Culture reincubated for better growth     Performed at [facility]   Report Status PENDING   Incomplete  CULTURE, BLOOD (ROUTINE X 2)     Status: None   Collection Time    09/23/13 11:17 AM      Result Value Ref Range Status   Specimen Description BLOOD RIGHT HAND   Final   Special Requests BOTTLES DRAWN AEROBIC ONLY 5CC   Final   Culture  Setup Time     Final   Value: 09/23/2013 16:27     Performed at [facility]  Culture     Final   Value:        BLOOD CULTURE RECEIVED NO GROWTH TO DATE CULTURE WILL BE HELD FOR 5 DAYS BEFORE ISSUING A FINAL NEGATIVE REPORT     Performed at [facility]   Report Status PENDING   Incomplete  CULTURE, BLOOD (ROUTINE X 2)     Status: None   Collection Time    09/23/13  2:37 PM      Result Value Ref Range Status   Specimen Description BLOOD RIGHT HAND   Final   Special Requests BOTTLES DRAWN AEROBIC ONLY 2CC   Final   Culture  Setup Time     Final   Value: 09/23/2013 16:28     Performed at [facility]   Culture     Final   Value:        BLOOD CULTURE RECEIVED NO GROWTH TO DATE CULTURE WILL BE HELD FOR 5 DAYS BEFORE ISSUING A FINAL NEGATIVE REPORT     Performed at [facility]   Report Status PENDING   Incomplete  MRSA PCR SCREENING     Status: None   Collection Time    09/24/13 12:15 AM      Result Value Ref Range Status   MRSA by PCR NEGATIVE  NEGATIVE Final   Comment:            The GeneXpert MRSA Assay (FDA     approved  for NASAL specimens     only), is one component of a     comprehensive MRSA colonization     surveillance program. It is not     intended to diagnose MRSA     infection nor to guide or     monitor treatment for     MRSA infections.     Studies:  Recent x-ray studies have been reviewed in detail by the Attending Physician  Scheduled Meds:  Scheduled Meds: . baclofen  20 mg Oral QID  . collagenase  1 application Topical Daily  . docusate sodium  100 mg Oral BID  . famotidine  20 mg Oral BID  . ferrous [id number] C-folic acid  1 capsule Oral Daily  . metoCLOPramide  10 mg Oral TID AC  . multivitamin with minerals  1 tablet Oral q morning - 10a  . potassium chloride SA  20 mEq Oral BID  . vancomycin  1,250 mg Intravenous Q12H  . vancomycin  1,000 mg Intravenous Once  . vancomycin  1,000 mg Intravenous Once  . zinc sulfate  220 mg Oral q morning - 10a    Time spent on care of this patient: 40 mins   Allie Bossier , MD   [facility] Office  [phone] Pager [phone]  On-Call/Text Page:      [email or web address]      password [email or web address]  If 7PM-7AM, please contact night-coverage [email or web address] Password [email or web address] 09/24/2013, 1:11 PM   LOS: 1 day
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
## 2013-09-24 NOTE — ED Provider Notes (Signed)
I was available for consult during the completion of this patient's course.  Carmin Muskrat, MD 09/24/13 [phone]

## 2013-09-24 NOTE — Progress Notes (Signed)
Utilization review completed.  

## 2013-09-25 ENCOUNTER — Inpatient Hospital Stay (HOSPITAL_COMMUNITY): Payer: Medicare Other

## 2013-09-25 DIAGNOSIS — A419 Sepsis, unspecified organism: Secondary | ICD-10-CM

## 2013-09-25 DIAGNOSIS — M869 Osteomyelitis, unspecified: Secondary | ICD-10-CM

## 2013-09-25 LAB — CBC WITH DIFFERENTIAL/PLATELET
BASOS ABS: 0 10*3/uL (ref 0.0–0.1)
Basophils Relative: 0 % (ref 0–1)
Eosinophils Absolute: 0.4 10*3/uL (ref 0.0–0.7)
Eosinophils Relative: 5 % (ref 0–5)
HEMATOCRIT: 26.6 % — AB (ref 39.0–52.0)
HEMOGLOBIN: 7.8 g/dL — AB (ref 13.0–17.0)
LYMPHS ABS: 2.1 10*3/uL (ref 0.7–4.0)
LYMPHS PCT: 25 % (ref 12–46)
MCH: 22.9 pg — ABNORMAL LOW (ref 26.0–34.0)
MCHC: 29.3 g/dL — ABNORMAL LOW (ref 30.0–36.0)
MCV: 78.2 fL (ref 78.0–100.0)
MONO ABS: 0.7 10*3/uL (ref 0.1–1.0)
MONOS PCT: 8 % (ref 3–12)
NEUTROS ABS: 5 10*3/uL (ref 1.7–7.7)
Neutrophils Relative %: 62 % (ref 43–77)
Platelets: 551 10*3/uL — ABNORMAL HIGH (ref 150–400)
RBC: 3.4 MIL/uL — ABNORMAL LOW (ref 4.22–5.81)
RDW: 16.9 % — ABNORMAL HIGH (ref 11.5–15.5)
WBC: 8.2 10*3/uL (ref 4.0–10.5)

## 2013-09-25 LAB — COMPREHENSIVE METABOLIC PANEL
ALBUMIN: 2.6 g/dL — AB (ref 3.5–5.2)
ALT: 13 U/L (ref 0–53)
AST: 15 U/L (ref 0–37)
Alkaline Phosphatase: 90 U/L (ref 39–117)
BUN: 8 mg/dL (ref 6–23)
CALCIUM: 8.9 mg/dL (ref 8.4–10.5)
CHLORIDE: 107 meq/L (ref 96–112)
CO2: 26 mEq/L (ref 19–32)
Creatinine, Ser: 0.45 mg/dL — ABNORMAL LOW (ref 0.50–1.35)
GFR calc Af Amer: >90 mL/min (ref >90)
GFR calc non Af Amer: >90 mL/min (ref >90)
Glucose, Bld: 95 mg/dL (ref 70–99)
POTASSIUM: 3.5 meq/L — AB (ref 3.7–5.3)
Sodium: 145 mEq/L (ref 137–147)
TOTAL PROTEIN: 7.4 g/dL (ref 6.0–8.3)
Total Bilirubin: <0.2 mg/dL — ABNORMAL LOW (ref 0.3–1.2)

## 2013-09-25 MED ORDER — BOOST PLUS PO LIQD
237.0000 mL | Freq: Two times a day (BID) | ORAL | Status: DC
  Administered 2013-09-25 – 2013-09-28 (×6): 237 mL via ORAL
  Filled 2013-09-25 (×9): qty 237

## 2013-09-25 MED ORDER — AMLODIPINE BESYLATE 5 MG PO TABS
5.0000 mg | ORAL_TABLET | Freq: Every day | ORAL | Status: DC
  Administered 2013-09-25 – 2013-09-26 (×2): 5 mg via ORAL
  Filled 2013-09-25 (×5): qty 1

## 2013-09-25 MED ORDER — JUVEN PO PACK
1.0000 | PACK | Freq: Two times a day (BID) | ORAL | Status: DC
  Administered 2013-09-25 – 2013-09-28 (×7): 1 via ORAL
  Filled 2013-09-25 (×7): qty 1

## 2013-09-25 NOTE — Progress Notes (Signed)
Pt to TX to 3W01, VSS, called report.

## 2013-09-25 NOTE — Progress Notes (Signed)
Pt. Was placed on CPAP of 16cm H2O via FFM (what pt. Wears at home) Pt. Is tolerating CPAP well at this time without any complications.

## 2013-09-25 NOTE — Progress Notes (Signed)
OT Cancellation Note  Patient Details Name: [name] [name] MRN: [id number] DOB: 05/31/1965   Cancelled Treatment:    Reason Eval/Treat Not Completed: OT screened, no needs identified, will sign off See PT note. [place], [place]  [phone] 09/25/2013 09/25/2013, 8:20 AM
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
## 2013-09-25 NOTE — Consult Note (Signed)
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 documented with a fluoroscopic spot image. The catheter was aspirated and flushed with saline  The venotomy incision was closed with subcutaneous 4-0 Vicryl. Dermabond was applied to the incision. The catheter exit site was secured with 0-Prolene retention sutures.  COMPLICATIONS: None.  No pneumothorax.  FINDINGS: After catheter placement, the tip lies at the cavoatrial junction. The catheter aspirates normally and is ready for immediate use.  IMPRESSION: Placement of tunneled central venous catheter via the right internal jugular vein. The catheter tip lies at the cavoatrial junction. The catheter is ready for immediate use.   Electronically Signed   By: Aletta Edouard M.D.   On: 09/23/2013 16:39   Recent Results (from the past 240 hour(s))  WOUND CULTURE     Status: None   Collection Time    09/23/13 10:50 AM      Result Value Ref Range Status   Specimen Description WOUND BUTTOCKS    Final   Special Requests NONE   Final   Gram Stain     Final   Value: NO WBC SEEN     NO SQUAMOUS EPITHELIAL CELLS SEEN     NO ORGANISMS SEEN     Performed at [facility]   Culture     Final   Value: FEW STAPHYLOCOCCUS AUREUS     Note: RIFAMPIN AND GENTAMICIN SHOULD NOT BE USED AS SINGLE DRUGS FOR TREATMENT OF STAPH INFECTIONS.     Performed at [facility]   Report Status PENDING   Incomplete  CULTURE, BLOOD (ROUTINE X 2)     Status: None   Collection Time    09/23/13 11:17 AM      Result Value Ref Range Status   Specimen Description BLOOD RIGHT HAND   Final   Special Requests BOTTLES DRAWN AEROBIC ONLY 5CC   Final   Culture  Setup Time     Final   Value: 09/23/2013 16:27     Performed at [facility]   Culture     Final   Value:        BLOOD CULTURE RECEIVED NO GROWTH TO DATE CULTURE WILL BE HELD FOR 5 DAYS BEFORE ISSUING A FINAL NEGATIVE REPORT     Performed at [facility]   Report Status PENDING   Incomplete  CULTURE, BLOOD (ROUTINE X 2)     Status: None   Collection Time    09/23/13  2:37 PM      Result Value Ref Range Status   Specimen Description BLOOD RIGHT HAND   Final   Special Requests BOTTLES DRAWN AEROBIC ONLY 2CC   Final   Culture  Setup Time     Final   Value: 09/23/2013 16:28     Performed at [facility]   Culture     Final   Value:        BLOOD CULTURE RECEIVED NO GROWTH TO DATE CULTURE WILL BE HELD FOR 5 DAYS BEFORE ISSUING A FINAL NEGATIVE REPORT     Performed at [facility]   Report Status PENDING   Incomplete  URINE CULTURE     Status: None   Collection Time    09/23/13  4:57 PM      Result Value Ref Range Status   Specimen Description URINE, CATHETERIZED  Final   Special Requests NONE   Final   Culture  Setup Time     Final   Value: 09/23/2013 23:39     Performed at [place] PENDING   Incomplete   Culture     Final   Value: Culture reincubated for better growth      Performed at [facility]   Report Status PENDING   Incomplete  MRSA PCR SCREENING     Status: None   Collection Time    09/24/13 12:15 AM      Result Value Ref Range Status   MRSA by PCR NEGATIVE  NEGATIVE Final   Comment:            The GeneXpert MRSA Assay (FDA     approved for NASAL specimens     only), is one component of a     comprehensive MRSA colonization     surveillance program. It is not     intended to diagnose MRSA     infection nor to guide or     monitor treatment for     MRSA infections.      09/25/2013, 2:28 PM     LOS: 2 days     **Disclaimer: This note may have been dictated with voice recognition software. Similar sounding words can inadvertently be transcribed and this note may contain transcription errors which may not have been corrected upon publication of note.**

## 2013-09-26 LAB — WOUND CULTURE: Gram Stain: NONE SEEN

## 2013-09-26 LAB — CBC WITH DIFFERENTIAL/PLATELET
BASOS ABS: 0 10*3/uL (ref 0.0–0.1)
BASOS PCT: 0 % (ref 0–1)
Eosinophils Absolute: 0.4 10*3/uL (ref 0.0–0.7)
Eosinophils Relative: 5 % (ref 0–5)
HCT: 25.8 % — ABNORMAL LOW (ref 39.0–52.0)
Hemoglobin: 7.6 g/dL — ABNORMAL LOW (ref 13.0–17.0)
LYMPHS PCT: 18 % (ref 12–46)
Lymphs Abs: 1.5 10*3/uL (ref 0.7–4.0)
MCH: 23 pg — ABNORMAL LOW (ref 26.0–34.0)
MCHC: 29.5 g/dL — AB (ref 30.0–36.0)
MCV: 78.2 fL (ref 78.0–100.0)
Monocytes Absolute: 0.8 10*3/uL (ref 0.1–1.0)
Monocytes Relative: 9 % (ref 3–12)
NEUTROS ABS: 5.5 10*3/uL (ref 1.7–7.7)
NEUTROS PCT: 68 % (ref 43–77)
Platelets: 506 10*3/uL — ABNORMAL HIGH (ref 150–400)
RBC: 3.3 MIL/uL — ABNORMAL LOW (ref 4.22–5.81)
RDW: 16.5 % — AB (ref 11.5–15.5)
WBC: 8.2 10*3/uL (ref 4.0–10.5)

## 2013-09-26 LAB — URINE CULTURE: Colony Count: >=100000

## 2013-09-26 LAB — COMPREHENSIVE METABOLIC PANEL
ALT: 14 U/L (ref 0–53)
AST: 20 U/L (ref 0–37)
Albumin: 2.3 g/dL — ABNORMAL LOW (ref 3.5–5.2)
Alkaline Phosphatase: 102 U/L (ref 39–117)
BUN: 9 mg/dL (ref 6–23)
CO2: 25 mEq/L (ref 19–32)
Calcium: 8.9 mg/dL (ref 8.4–10.5)
Chloride: 106 mEq/L (ref 96–112)
Creatinine, Ser: 0.45 mg/dL — ABNORMAL LOW (ref 0.50–1.35)
GFR calc Af Amer: >90 mL/min (ref >90)
Glucose, Bld: 93 mg/dL (ref 70–99)
Potassium: 4.3 mEq/L (ref 3.7–5.3)
Sodium: 142 mEq/L (ref 137–147)
Total Bilirubin: <0.2 mg/dL — ABNORMAL LOW (ref 0.3–1.2)
Total Protein: 6.9 g/dL (ref 6.0–8.3)

## 2013-09-26 LAB — VANCOMYCIN, TROUGH: Vancomycin Tr: 18.9 ug/mL (ref 10.0–20.0)

## 2013-09-26 NOTE — Progress Notes (Signed)
Spoke with pt regarding cpap.  Pt stated he would like to put it on around midnight.  RT will be back per pt request.

## 2013-09-26 NOTE — Progress Notes (Signed)
Dr Dillard Essex notified earlier am of +MRSA  wound culture from buttock would culture. She requested to also notify Dr [name], this was done at this time.

## 2013-09-26 NOTE — Progress Notes (Signed)
ANTIBIOTIC CONSULT NOTE - FOLLOW UP  Pharmacy Consult for Vancomycin and Zosyn Indication: rule out sepsis secondary to UTI/ chronic osteo  Allergies  Allergen Reactions  . Ditropan [Oxybutynin] Other (See Comments)    hallucinations    Patient Measurements: Height: 6' (182.9 cm) Weight: 273 lb 5.9 oz (124 kg) IBW/kg (Calculated) : 77.6  Vital Signs: Temp: 97.4 F (36.3 C) (05/14 1321) Temp src: Oral (05/14 1321) BP: 119/68 mmHg (05/14 1321) Pulse Rate: 81 (05/14 1321) Intake/Output from previous day: 05/13 0701 - 05/14 0700 In: 3780 [P.O.:780; I.V.:2400; IV Piggyback:600] Out: 3900 [Urine:3300; Stool:600] Intake/Output from this shift: Total I/O In: 1281.7 [P.O.:720; I.V.:261.7; IV Piggyback:300] Out: -   Labs:  Recent Labs  09/24/13 0540 09/24/13 1200 09/25/13 0445 09/26/13 0520  WBC 8.6  --  8.2 8.2  HGB 7.3* 7.2* 7.8* 7.6*  PLT 519*  --  551* 506*  CREATININE 0.44*  --  0.45* 0.45*   Estimated Creatinine Clearance: 155.3 ml/min (by C-G formula based on Cr of 0.45).  Recent Labs  09/26/13 1400  [place] 18.9     Microbiology: Recent Results (from the past 720 hour(s))  WOUND CULTURE     Status: None   Collection Time    09/23/13 10:50 AM      Result Value Ref Range Status   Specimen Description WOUND BUTTOCKS   Final   Special Requests NONE   Final   Gram Stain     Final   Value: NO WBC SEEN     NO SQUAMOUS EPITHELIAL CELLS SEEN     NO ORGANISMS SEEN     Performed at [facility]   Culture     Final   Value: FEW METHICILLIN RESISTANT STAPHYLOCOCCUS AUREUS     Note: RIFAMPIN AND GENTAMICIN SHOULD NOT BE USED AS SINGLE DRUGS FOR TREATMENT OF STAPH INFECTIONS. CRITICAL RESULT CALLED TO, READ BACK BY AND VERIFIED WITH: TAMMY P@[id number]  ON [id number] BY [facility]     Performed at [facility]   Report Status 09/26/2013 FINAL   Final   Organism ID, Bacteria METHICILLIN RESISTANT STAPHYLOCOCCUS AUREUS   Final  CULTURE, BLOOD (ROUTINE X 2)      Status: None   Collection Time    09/23/13 11:17 AM      Result Value Ref Range Status   Specimen Description BLOOD RIGHT HAND   Final   Special Requests BOTTLES DRAWN AEROBIC ONLY 5CC   Final   Culture  Setup Time     Final   Value: 09/23/2013 16:27     Performed at [facility]   Culture     Final   Value:        BLOOD CULTURE RECEIVED NO GROWTH TO DATE CULTURE WILL BE HELD FOR 5 DAYS BEFORE ISSUING A FINAL NEGATIVE REPORT     Performed at [facility]   Report Status PENDING   Incomplete  CULTURE, BLOOD (ROUTINE X 2)     Status: None   Collection Time    09/23/13  2:37 PM      Result Value Ref Range Status   Specimen Description BLOOD RIGHT HAND   Final   Special Requests BOTTLES DRAWN AEROBIC ONLY 2CC   Final   Culture  Setup Time     Final   Value: 09/23/2013 16:28     Performed at [facility]   Culture     Final   Value:        BLOOD CULTURE  RECEIVED NO GROWTH TO DATE CULTURE WILL BE HELD FOR 5 DAYS BEFORE ISSUING A FINAL NEGATIVE REPORT     Performed at [facility]   Report Status PENDING   Incomplete  URINE CULTURE     Status: None   Collection Time    09/23/13  4:57 PM      Result Value Ref Range Status   Specimen Description URINE, CATHETERIZED   Final   Special Requests NONE   Final   Culture  Setup Time     Final   Value: 09/23/2013 23:39     Performed at [facility]     Final   Value: >=100,000 COLONIES/ML     Performed at [facility]   Culture     Final   Value: PROVIDENCIA STUARTII     Note: Two isolates with different morphologies were identified as the same organism.The most resistant organism was reported.     Performed at [facility]   Report Status 09/26/2013 FINAL   Final   Organism ID, Bacteria PROVIDENCIA STUARTII   Final  MRSA PCR SCREENING     Status: None   Collection Time    09/24/13 12:15 AM      Result Value Ref Range Status   MRSA by PCR NEGATIVE  NEGATIVE Final    Comment:            The GeneXpert MRSA Assay (FDA     approved for NASAL specimens     only), is one component of a     comprehensive MRSA colonization     surveillance program. It is not     intended to diagnose MRSA     infection nor to guide or     monitor treatment for     MRSA infections.    Anti-infectives   Start     Dose/Rate Route Frequency Ordered Stop   09/24/13 1445  piperacillin-tazobactam (ZOSYN) IVPB 3.375 g     3.375 g 12.5 mL/hr over 240 Minutes Intravenous Every 8 hours 09/24/13 1434     09/24/13 1400  vancomycin (VANCOCIN) 1,250 mg in sodium chloride 0.9 % 250 mL IVPB     1,250 mg 166.7 mL/hr over 90 Minutes Intravenous Every 12 hours 09/23/13 2212     09/24/13 0130  vancomycin (VANCOCIN) 2,000 mg in sodium chloride 0.9 % 500 mL IVPB     2,000 mg 250 mL/hr over 120 Minutes Intravenous  Once 09/24/13 0124 09/24/13 0419   09/23/13 2145  vancomycin (VANCOCIN) IVPB 1000 mg/200 mL premix  Status:  Discontinued     1,000 mg 200 mL/hr over 60 Minutes Intravenous  Once 09/23/13 2137 09/24/13 1453   09/23/13 2145  vancomycin (VANCOCIN) IVPB 1000 mg/200 mL premix  Status:  Discontinued     1,000 mg 200 mL/hr over 60 Minutes Intravenous  Once 09/23/13 2138 09/24/13 1453   09/23/13 2045  piperacillin-tazobactam (ZOSYN) IVPB 3.375 g     3.375 g 12.5 mL/hr over 240 Minutes Intravenous  Once 09/23/13 2032 09/24/13 0107      Assessment: 48 yo M with long standing hx of sacral wound, recurrent osteo, and outpt wound care.  Wound Cx is growing MRSA.  Pt also has a Provedencia UTI S Zosyn.  Pt's SCr is 0.45 however this can be misleading in a patient with paraplegia, therefore a Vancomycin trough was checked to evaluate drug dosing.  Trough was within desired goal range.  Goal of Therapy:  Vancomycin trough level 15-20 mcg/ml  Plan:  Continue Vancomycin 1250 mg IV q12h Continue Zosyn 3.375 gm IV q8h (4 hour infusion). Follow up cultura data, clinical progress and ID  recs.  [employer], Pharm.D., BCPS Clinical Pharmacist Pager [phone] 09/26/2013 5:02 PM

## 2013-09-26 NOTE — Progress Notes (Signed)
INFECTIOUS DISEASE PROGRESS NOTE  ID: [name] [name] is a 48 y.o. male with  Active Problems:   Quadriplegia   Seizure disorder   HTN (hypertension)   Sacral decubitus ulcer, stage IV   S/P colostomy   Suprapubic catheter   OSA on CPAP   Anemia   Sepsis   UTI (lower urinary tract infection)  Subjective: Without complaints  Abtx:  Anti-infectives   Start     Dose/Rate Route Frequency Ordered Stop   09/24/13 1445  piperacillin-tazobactam (ZOSYN) IVPB 3.375 g     3.375 g 12.5 mL/hr over 240 Minutes Intravenous Every 8 hours 09/24/13 1434     09/24/13 1400  vancomycin (VANCOCIN) 1,250 mg in sodium chloride 0.9 % 250 mL IVPB     1,250 mg 166.7 mL/hr over 90 Minutes Intravenous Every 12 hours 09/23/13 2212     09/24/13 0130  vancomycin (VANCOCIN) 2,000 mg in sodium chloride 0.9 % 500 mL IVPB     2,000 mg 250 mL/hr over 120 Minutes Intravenous  Once 09/24/13 0124 09/24/13 0419   09/23/13 2145  vancomycin (VANCOCIN) IVPB 1000 mg/200 mL premix  Status:  Discontinued     1,000 mg 200 mL/hr over 60 Minutes Intravenous  Once 09/23/13 2137 09/24/13 1453   09/23/13 2145  vancomycin (VANCOCIN) IVPB 1000 mg/200 mL premix  Status:  Discontinued     1,000 mg 200 mL/hr over 60 Minutes Intravenous  Once 09/23/13 2138 09/24/13 1453   09/23/13 2045  piperacillin-tazobactam (ZOSYN) IVPB 3.375 g     3.375 g 12.5 mL/hr over 240 Minutes Intravenous  Once 09/23/13 2032 09/24/13 0107      Medications:  Scheduled: . amLODipine  5 mg Oral Daily  . baclofen  20 mg Oral QID  . collagenase  1 application Topical Daily  . docusate sodium  100 mg Oral BID  . enoxaparin (LOVENOX) injection  60 mg Subcutaneous QHS  . famotidine  20 mg Oral BID  . ferrous [id number] C-folic acid  1 capsule Oral Daily  . lactose free nutrition  237 mL Oral BID BM  . metoCLOPramide  10 mg Oral TID AC  . multivitamin with minerals  1 tablet Oral q morning - 10a  . nutrition supplement (JUVEN)  1 packet Oral  BID BM  . piperacillin-tazobactam (ZOSYN)  IV  3.375 g Intravenous Q8H  . potassium chloride SA  20 mEq Oral BID  . vancomycin  1,250 mg Intravenous Q12H  . zinc sulfate  220 mg Oral q morning - 10a    Objective: Vital signs in last 24 hours: Temp:  [97 F (36.1 C)-97.8 F (36.6 C)] 97.6 F (36.4 C) (05/14 0500) Pulse Rate:  [60-84] 81 (05/14 0500) Resp:  [16-18] 18 (05/14 0500) BP: (109-140)/(47-80) 140/80 mmHg (05/14 0500) SpO2:  [99 %-100 %] 99 % (05/14 0500)   General appearance: alert, cooperative and no distress Resp: clear to auscultation bilaterally Cardio: regular rate and rhythm GI: normal findings: bowel sounds normal and soft, non-tender  Lab Results  Recent Labs  09/25/13 0445 09/26/13 0520  WBC 8.2 8.2  HGB 7.8* 7.6*  HCT 26.6* 25.8*  NA 145 142  K 3.5* 4.3  CL 107 106  CO2 26 25  BUN 8 9  CREATININE 0.45* 0.45*   Liver Panel  Recent Labs  09/25/13 0445 09/26/13 0520  PROT 7.4 6.9  ALBUMIN 2.6* 2.3*  AST 15 20  ALT 13 14  ALKPHOS 90 102  BILITOT <0.2* <0.2*   Sedimentation  Rate No results found for this basename: ESRSEDRATE,  in the last 72 hours C-Reactive Protein  Recent Labs  09/23/13 1117  CRP 10.8*    Microbiology: Recent Results (from the past 240 hour(s))  WOUND CULTURE     Status: None   Collection Time    09/23/13 10:50 AM      Result Value Ref Range Status   Specimen Description WOUND BUTTOCKS   Final   Special Requests NONE   Final   Gram Stain     Final   Value: NO WBC SEEN     NO SQUAMOUS EPITHELIAL CELLS SEEN     NO ORGANISMS SEEN     Performed at [facility]   Culture     Final   Value: FEW STAPHYLOCOCCUS AUREUS     Note: RIFAMPIN AND GENTAMICIN SHOULD NOT BE USED AS SINGLE DRUGS FOR TREATMENT OF STAPH INFECTIONS.     Performed at [facility]   Report Status PENDING   Incomplete  CULTURE, BLOOD (ROUTINE X 2)     Status: None   Collection Time    09/23/13 11:17 AM      Result Value Ref  Range Status   Specimen Description BLOOD RIGHT HAND   Final   Special Requests BOTTLES DRAWN AEROBIC ONLY 5CC   Final   Culture  Setup Time     Final   Value: 09/23/2013 16:27     Performed at [facility]   Culture     Final   Value:        BLOOD CULTURE RECEIVED NO GROWTH TO DATE CULTURE WILL BE HELD FOR 5 DAYS BEFORE ISSUING A FINAL NEGATIVE REPORT     Performed at [facility]   Report Status PENDING   Incomplete  CULTURE, BLOOD (ROUTINE X 2)     Status: None   Collection Time    09/23/13  2:37 PM      Result Value Ref Range Status   Specimen Description BLOOD RIGHT HAND   Final   Special Requests BOTTLES DRAWN AEROBIC ONLY 2CC   Final   Culture  Setup Time     Final   Value: 09/23/2013 16:28     Performed at [facility]   Culture     Final   Value:        BLOOD CULTURE RECEIVED NO GROWTH TO DATE CULTURE WILL BE HELD FOR 5 DAYS BEFORE ISSUING A FINAL NEGATIVE REPORT     Performed at [facility]   Report Status PENDING   Incomplete  URINE CULTURE     Status: None   Collection Time    09/23/13  4:57 PM      Result Value Ref Range Status   Specimen Description URINE, CATHETERIZED   Final   Special Requests NONE   Final   Culture  Setup Time     Final   Value: 09/23/2013 23:39     Performed at [place]     Final   Value: >=100,000 COLONIES/ML     Performed at [facility]   Culture     Final   Value: [place]     Performed at [facility]   Report Status PENDING   Incomplete  MRSA PCR SCREENING     Status: None   Collection Time    09/24/13 12:15 AM      Result Value Ref Range Status  MRSA by PCR NEGATIVE  NEGATIVE Final   Comment:            The GeneXpert MRSA Assay (FDA     approved for NASAL specimens     only), is one component of a     comprehensive MRSA colonization     surveillance program. It is not     intended to diagnose MRSA     infection nor to guide or     monitor  treatment for     MRSA infections.    Studies/Results: Dg Abd Portable 1v  09/25/2013   CLINICAL DATA:  Abdominal distension, hypotension, history quadriplegia  EXAM: PORTABLE ABDOMEN - 1 VIEW  COMPARISON:  Portable exam 1722 hr compared to 01/30/2013  FINDINGS: Gaseous distention of stomach.  Increased stool in rectum.  Bowel gas pattern otherwise normal.  No bowel wall thickening.  Bones demineralized with degenerative changes and pseudo acetabularization at RIGHT hip.  IMPRESSION: Gaseous distention of stomach.  Mildly increased stool in rectum.   Electronically Signed   By: Lavonia Dana M.D.   On: 09/25/2013 17:36     Assessment/Plan: Sepsis  Osteomyelitis,  Recurrent UTI  Total days of antibiotics: 3 vanco/zosyn  Would Continue anbx Await cx and sensi He has questions about what could help his bone degeneration, fosamax? Consideration for placement, long term wound care         [name] [name] Infectious Diseases (pager) [phone] [email or web address] 09/26/2013, 8:36 AM  LOS: 3 days   **Disclaimer: This note may have been dictated with voice recognition software. Similar sounding words can inadvertently be transcribed and this note may contain transcription errors which may not have been corrected upon publication of note.**

## 2013-09-26 NOTE — Progress Notes (Signed)
Progress Note  [name] [name] [id number] DOB: [birth year] DOA: 09/23/2013 PCP: [name] Greenland, MD  Admit HPI / Brief Narrative: 48 y.o. BM PMHx OSA on CPAP, HTN, seizure disorder, quadriplegia s/p C5 fracture about 23 years ago, stage IV sacral decubitus ulcer, recurrent osteomyelitis presenting with sepsis, acute on chronic osteomyelitis flare, UTI. Patient currently lives at home with in-house caregiver. Patient states that his caregiver noticed worsening drainage from his sacral decubiti ulcers with particular in the drainage. Caregiver was concerned about recurrence of infection. Patient states he did have some mild subjective fevers and chills last weekend, but none over the past 2-3 days. States he has had also had some off cough for the past week. No chest pain, dyspnea, increased work of breathing. Denies any other symptoms. Has no sensation below the mid chest.patient notes abdomen for similar symptoms February 2015.had a urinary tract infection and ruled out [place] he has Pseudomonas, both of which were sensitive to Zosyn. Is followed by infectious disease outpatient for chronic osteomyelitis.  Patient presents to the ER today.Afebrile. Blood pressure is lower limits of normal with systolic pressures in the 90s to 110s. Blood pressure seems to be trending with IV fluids.White blood cell count 11. Hemoglobin of 8.9.urinalysis indicative of infection.a repeat MRI shows very similar in appearance compared to last MRI October 2014.a tunnel central line was placed by interventional radiology for an IV antibiotics.Hospitalist requested for medical admission.    HPI/Subjective: Denies any complaints today, states that he sees Dr [name] the plastic surgeon at the wound care center already, and again confirms that he does not want to to go an SNF.  Assessment/Plan: Sepsis/complicated UTI:  -Likely secondary to UTI from chronic indwelling suprapubic cath, ? acute or chronic osteomyelitis flare>>  pelvic MRI upon admission appearance same as prior MRIs. -on Zosyn + vancomycin -Blood and urine cultures so far are negative -Wound culture growing MRSA today -Appreciate ID input>> follow up for further recommendations -As above patient states he follows with Dr. Scientist, physiological and last saw her 2 weeks ago and has another followup appointment with her in 2 weeks at the wound care center. -Continue local wound care  Sacral decubitus stage IV -Chronic; local wound care per WOC recommendations  Quadriplegia:  -Continue baclofen.   Leukocytosis:  -Resolved  -Continue to follow   Anemia: -Patient's baseline hemoglobin between 6-9, currently at 7.8 -No active signs of bleeding. Continue to follow.   HTN:  -BP remains stable, continue current Norvasc for now and follow  Chronic osteomyelitis No significant changes on MRI per report. See report below -ID consult for further recommendations as above  Deconditioning -PT/OT following -Discussed SNF on 5/13 and 14 and patient states he's not interested in going to one on discharge  OSA -CPAP per respiratory    Code Status: FULL Family Communication: None at bedside Disposition Plan: Home   Consultants: ID  Procedure/Significant Events: PCXR 09/23/2013 Chronic changes without acute abnormality.  MRI pelvis with and without contrast 09/23/2013 -. Very similar appearance to prior exams back through 02/27/2013, including decubitus ulcer extending into the right hip joint ;  -decubitus ulcers extending to bone along both ischial tuberosities; decubitus ulcer extending to Lt side  sacrum;  -extensive bony destructive findings proximal femurs and adjacent acetabula 2dary chronic osteomyelitis; -chronic osteomyelitis along the left sacroiliac joint region ;  -chronic osteomyelitis of the ischial tuberosities; -scattered bony fragments in the left hip joint cavity.  -ulceration posterior to left hip joint  Culture 5/14 MRSA wound culture positive 5/12 MRSA by PCR negative 5/11 urine culture pending 5/11 right hand x2 NTD (pending)    Antibiotics: Vancomycin 5/12>> Zosyn 5/12>>   DVT prophylaxis: Lovenox   Devices   LINES / TUBES:  5/11 tunneled CVC  via RIJ  Chronic Suprapubic cath     Continuous Infusions: . sodium chloride 100 mL/hr at 09/25/13 2157    Objective: VITAL SIGNS: Temp: 97.6 F (36.4 C) (05/14 0500) Temp src: Oral (05/14 0500) BP: 140/80 mmHg (05/14 0500) Pulse Rate: 81 (05/14 0500) SPO2; FIO2:   Intake/Output Summary (Last 24 hours) at 09/26/13 1315 Last data filed at 09/26/13 1012  Gross per 24 hour  Intake   3650 ml  Output   3550 ml  Net    100 ml     Exam: General: A./O. x4, NAD, No acute respiratory distress Lungs: Clear to auscultation bilaterally without wheezes or crackles Cardiovascular: Tachycardic, regular rhythm without murmur gallop or rub normal S1 and S2 Abdomen: Nontender,distended, soft, bowel sounds positive, no rebound, no ascites, no appreciable mass, suprapubic cath in place negative signs symptoms of infection. Extremities: Bilateral lower extremity contracted in the leg toward the right. Right posterior calf stage III decubiti covered and clean negative sign of infection Skin; bilateral stage IV sacral decubiti clear drainage covered and clean no sign of infection.  Data Reviewed: Basic Metabolic Panel:  Recent Labs Lab 09/23/13 1117 09/23/13 2142 09/24/13 0540 09/25/13 0445 09/26/13 0520  NA 142  --  144 145 142  K 4.4  --  3.9 3.5* 4.3  CL 102  --  105 107 106  CO2 26  --  27 26 25   GLUCOSE 110*  --  93 95 93  BUN 25*  --  16 8 9   CREATININE 0.54 0.32* 0.44* 0.45* 0.45*  CALCIUM 9.1  --  8.6 8.9 8.9   Liver Function Tests:  Recent Labs Lab 09/24/13 0540 09/25/13 0445 09/26/13 0520  AST 13 15 20   ALT 11 13 14   ALKPHOS 82 90 102  BILITOT <0.2* <0.2* <0.2*  PROT 6.8 7.4 6.9  ALBUMIN  2.4* 2.6* 2.3*   No results found for this basename: LIPASE, AMYLASE,  in the last 168 hours No results found for this basename: AMMONIA,  in the last 168 hours CBC:  Recent Labs Lab 09/23/13 1117 09/23/13 2142 09/23/13 2255 09/24/13 0540 09/24/13 1200 09/25/13 0445 09/26/13 0520  WBC 11.0* 7.4  --  8.6  --  8.2 8.2  NEUTROABS 7.2  --   --  5.7  --  5.0 5.5  HGB 8.9* 6.1* 9.7* 7.3* 7.2* 7.8* 7.6*  HCT 30.0* 20.7* 32.2* 24.4* 24.4* 26.6* 25.8*  MCV 77.9* 77.2*  --  77.2*  --  78.2 78.2  PLT 583* 402*  --  519*  --  551* 506*   Cardiac Enzymes: No results found for this basename: CKTOTAL, CKMB, CKMBINDEX, TROPONINI,  in the last 168 hours BNP (last 3 results)  Recent Labs  07/19/13 1938  PROBNP 79.5   CBG: No results found for this basename: GLUCAP,  in the last 168 hours  Recent Results (from the past 240 hour(s))  WOUND CULTURE     Status: None   Collection Time    09/23/13 10:50 AM      Result Value Ref Range Status   Specimen Description WOUND BUTTOCKS   Final   Special Requests NONE   Final   Gram Stain  Final   Value: NO WBC SEEN     NO SQUAMOUS EPITHELIAL CELLS SEEN     NO ORGANISMS SEEN     Performed at [facility]   Culture     Final   Value: FEW METHICILLIN RESISTANT STAPHYLOCOCCUS AUREUS     Note: RIFAMPIN AND GENTAMICIN SHOULD NOT BE USED AS SINGLE DRUGS FOR TREATMENT OF STAPH INFECTIONS. CRITICAL RESULT CALLED TO, READ BACK BY AND VERIFIED WITH: TAMMY P@0937  ON [id number] BY [facility]     Performed at [facility]   Report Status 09/26/2013 FINAL   Final   Organism ID, Bacteria METHICILLIN RESISTANT STAPHYLOCOCCUS AUREUS   Final  CULTURE, BLOOD (ROUTINE X 2)     Status: None   Collection Time    09/23/13 11:17 AM      Result Value Ref Range Status   Specimen Description BLOOD RIGHT HAND   Final   Special Requests BOTTLES DRAWN AEROBIC ONLY 5CC   Final   Culture  Setup Time     Final   Value: 09/23/2013 16:27     Performed at [facility]   Culture     Final   Value:        BLOOD CULTURE RECEIVED NO GROWTH TO DATE CULTURE WILL BE HELD FOR 5 DAYS BEFORE ISSUING A FINAL NEGATIVE REPORT     Performed at [facility]   Report Status PENDING   Incomplete  CULTURE, BLOOD (ROUTINE X 2)     Status: None   Collection Time    09/23/13  2:37 PM      Result Value Ref Range Status   Specimen Description BLOOD RIGHT HAND   Final   Special Requests BOTTLES DRAWN AEROBIC ONLY 2CC   Final   Culture  Setup Time     Final   Value: 09/23/2013 16:28     Performed at [facility]   Culture     Final   Value:        BLOOD CULTURE RECEIVED NO GROWTH TO DATE CULTURE WILL BE HELD FOR 5 DAYS BEFORE ISSUING A FINAL NEGATIVE REPORT     Performed at [facility]   Report Status PENDING   Incomplete  URINE CULTURE     Status: None   Collection Time    09/23/13  4:57 PM      Result Value Ref Range Status   Specimen Description URINE, CATHETERIZED   Final   Special Requests NONE   Final   Culture  Setup Time     Final   Value: 09/23/2013 23:39     Performed at [facility]     Final   Value: >=100,000 COLONIES/ML     Performed at [facility]   Culture     Final   Value: PROVIDENCIA STUARTII     Note: Two isolates with different morphologies were identified as the same organism.The most resistant organism was reported.     Performed at [facility]   Report Status 09/26/2013 FINAL   Final   Organism ID, Bacteria PROVIDENCIA STUARTII   Final  MRSA PCR SCREENING     Status: None   Collection Time    09/24/13 12:15 AM      Result Value Ref Range Status   MRSA by PCR NEGATIVE  NEGATIVE Final   Comment:            The GeneXpert MRSA Assay (FDA  approved for NASAL specimens     only), is one component of a     comprehensive MRSA colonization     surveillance program. It is not     intended to diagnose MRSA     infection nor to guide or     monitor treatment for      MRSA infections.     Studies:  Recent x-ray studies have been reviewed in detail by the Attending Physician  Scheduled Meds:  Scheduled Meds: . amLODipine  5 mg Oral Daily  . baclofen  20 mg Oral QID  . collagenase  1 application Topical Daily  . docusate sodium  100 mg Oral BID  . enoxaparin (LOVENOX) injection  60 mg Subcutaneous QHS  . famotidine  20 mg Oral BID  . ferrous [id number] C-folic acid  1 capsule Oral Daily  . lactose free nutrition  237 mL Oral BID BM  . metoCLOPramide  10 mg Oral TID AC  . multivitamin with minerals  1 tablet Oral q morning - 10a  . nutrition supplement (JUVEN)  1 packet Oral BID BM  . piperacillin-tazobactam (ZOSYN)  IV  3.375 g Intravenous Q8H  . potassium chloride SA  20 mEq Oral BID  . vancomycin  1,250 mg Intravenous Q12H  . zinc sulfate  220 mg Oral q morning - 10a    Time spent on care of this patient: [place] , MD   [facility] Office  [phone] Pager [phone]  On-Call/Text Page:      [email or web address]      password [email or web address]  If 7PM-7AM, please contact night-coverage [email or web address] Password [email or web address] 09/26/2013, 1:15 PM   LOS: 3 days

## 2013-09-27 DIAGNOSIS — A4902 Methicillin resistant Staphylococcus aureus infection, unspecified site: Secondary | ICD-10-CM

## 2013-09-27 DIAGNOSIS — I959 Hypotension, unspecified: Secondary | ICD-10-CM

## 2013-09-27 LAB — CBC WITH DIFFERENTIAL/PLATELET
BASOS ABS: 0 10*3/uL (ref 0.0–0.1)
BASOS PCT: 0 % (ref 0–1)
EOS ABS: 0.4 10*3/uL (ref 0.0–0.7)
EOS PCT: 6 % — AB (ref 0–5)
HCT: 25.2 % — ABNORMAL LOW (ref 39.0–52.0)
Hemoglobin: 7.3 g/dL — ABNORMAL LOW (ref 13.0–17.0)
LYMPHS PCT: 21 % (ref 12–46)
Lymphs Abs: 1.4 10*3/uL (ref 0.7–4.0)
MCH: 23.1 pg — ABNORMAL LOW (ref 26.0–34.0)
MCHC: 29 g/dL — AB (ref 30.0–36.0)
MCV: 79.7 fL (ref 78.0–100.0)
Monocytes Absolute: 0.6 10*3/uL (ref 0.1–1.0)
Monocytes Relative: 8 % (ref 3–12)
Neutro Abs: 4.6 10*3/uL (ref 1.7–7.7)
Neutrophils Relative %: 65 % (ref 43–77)
Platelets: 478 10*3/uL — ABNORMAL HIGH (ref 150–400)
RBC: 3.16 MIL/uL — ABNORMAL LOW (ref 4.22–5.81)
RDW: 16.7 % — AB (ref 11.5–15.5)
WBC: 7 10*3/uL (ref 4.0–10.5)

## 2013-09-27 LAB — COMPREHENSIVE METABOLIC PANEL
ALBUMIN: 2.3 g/dL — AB (ref 3.5–5.2)
ALK PHOS: 97 U/L (ref 39–117)
ALT: 13 U/L (ref 0–53)
AST: 15 U/L (ref 0–37)
BUN: 11 mg/dL (ref 6–23)
CHLORIDE: 109 meq/L (ref 96–112)
CO2: 27 mEq/L (ref 19–32)
Calcium: 8.9 mg/dL (ref 8.4–10.5)
Creatinine, Ser: 0.5 mg/dL (ref 0.50–1.35)
GFR calc Af Amer: >90 mL/min (ref >90)
GFR calc non Af Amer: >90 mL/min (ref >90)
Glucose, Bld: 101 mg/dL — ABNORMAL HIGH (ref 70–99)
POTASSIUM: 4.4 meq/L (ref 3.7–5.3)
SODIUM: 146 meq/L (ref 137–147)
TOTAL PROTEIN: 6.7 g/dL (ref 6.0–8.3)
Total Bilirubin: <0.2 mg/dL — ABNORMAL LOW (ref 0.3–1.2)

## 2013-09-27 MED ORDER — SULFAMETHOXAZOLE-TRIMETHOPRIM 400-80 MG PO TABS
1.0000 | ORAL_TABLET | Freq: Two times a day (BID) | ORAL | Status: DC
  Administered 2013-09-28: 1 via ORAL
  Filled 2013-09-27 (×2): qty 1

## 2013-09-27 MED ORDER — DEXTROSE 5 % IV SOLN
1.0000 g | INTRAVENOUS | Status: DC
  Administered 2013-09-27 – 2013-09-28 (×2): 1 g via INTRAVENOUS
  Filled 2013-09-27 (×3): qty 10

## 2013-09-27 MED ORDER — SODIUM CHLORIDE 0.9 % IV BOLUS (SEPSIS)
500.0000 mL | Freq: Once | INTRAVENOUS | Status: AC
  Administered 2013-09-27: 500 mL via INTRAVENOUS

## 2013-09-27 NOTE — Progress Notes (Signed)
Progress Note  [name] [name] [id number] DOB: 1966-03-06 DOA: 09/23/2013 PCP: [name], MD  Admit HPI / Brief Narrative: 48 y.o. BM PMHx OSA on CPAP, HTN, seizure disorder, quadriplegia s/p C5 fracture about 23 years ago, stage IV sacral decubitus ulcer, recurrent osteomyelitis presenting with sepsis, acute on chronic osteomyelitis flare, UTI. Patient currently lives at home with in-house caregiver. Patient states that his caregiver noticed worsening drainage from his sacral decubiti ulcers with particular in the drainage. Caregiver was concerned about recurrence of infection. Patient states he did have some mild subjective fevers and chills last weekend, but none over the past 2-3 days. States he has had also had some off cough for the past week. No chest pain, dyspnea, increased work of breathing. Denies any other symptoms. Has no sensation below the mid chest.patient notes abdomen for similar symptoms February 2015.had a urinary tract infection and ruled out [place] he has Pseudomonas, both of which were sensitive to Zosyn. Is followed by infectious disease outpatient for chronic osteomyelitis.  Patient presents to the ER today.Afebrile. Blood pressure is lower limits of normal with systolic pressures in the 90s to 110s. Blood pressure seems to be trending with IV fluids.White blood cell count 11. Hemoglobin of 8.9.urinalysis indicative of infection.a repeat MRI shows very similar in appearance compared to last MRI October 2014.a tunnel central line was placed by interventional radiology for an IV antibiotics.Hospitalist requested for medical admission.    HPI/Subjective: Complaining of feeling dizzy this a.m.  Assessment/Plan: Sepsis/complicated UTI:  -Likely secondary to UTI from chronic indwelling suprapubic cath, ? acute or chronic osteomyelitis flare>> pelvic MRI upon admission appearance same as prior MRIs. -on Zosyn + vancomycin>> ID to followup for further antibiotic  recommendations -Blood and urine cultures so far are negative -Wound culture growing MRSA today -Appreciate ID input>> follow up for further recommendations -As above patient states he follows with Dr. Scientist, physiological and last saw her 2 weeks ago and has another followup appointment with her in 2 weeks at the wound care center. -Continue local wound care  Sacral decubitus stage IV -Chronic; local wound care per WOC recommendations  Quadriplegia:  -Continue baclofen.   Leukocytosis:  -Resolved  -Continue to follow   Anemia: -Patient's baseline hemoglobin between 6-9, currently at 7.8 -No active signs of bleeding. Continue to follow.   Hypotension:  -BP in the 90s and this a.m. and patient symptomatic as above>> will bolus with normal saline and hold antihypertensives follow and recheck  Chronic osteomyelitis No significant changes on MRI per report. See report below -ID consult for further recommendations as above  Deconditioning -PT/OT following -Discussed SNF on 5/13 and 14 and patient states he's not interested in going to one on discharge  OSA -CPAP per respiratory    Code Status: FULL Family Communication: None at bedside Disposition Plan: Home   Consultants: ID  Procedure/Significant Events: PCXR 09/23/2013 Chronic changes without acute abnormality.  MRI pelvis with and without contrast 09/23/2013 -. Very similar appearance to prior exams back through 02/27/2013, including decubitus ulcer extending into the right hip joint ;  -decubitus ulcers extending to bone along both ischial tuberosities; decubitus ulcer extending to Lt side  sacrum;  -extensive bony destructive findings proximal femurs and adjacent acetabula 2dary chronic osteomyelitis; -chronic osteomyelitis along the left sacroiliac joint region ;  -chronic osteomyelitis of the ischial tuberosities; -scattered bony fragments in the left hip joint cavity.  -ulceration posterior to left hip joint      Culture 5/14 MRSA wound culture positive  5/12 MRSA by PCR negative 5/11 urine culture pending 5/11 right hand x2 NTD (pending)    Antibiotics: Vancomycin 5/12>> Zosyn 5/12>>   DVT prophylaxis: Lovenox   Devices   LINES / TUBES:  5/11 tunneled CVC  via RIJ  Chronic Suprapubic cath     Continuous Infusions: . sodium chloride 10 mL/hr (09/26/13 0900)    Objective: VITAL SIGNS: Temp: 97.8 F (36.6 C) (05/15 0534) Temp src: Oral (05/15 0534) BP: 91/48 mmHg (05/15 1057) Pulse Rate: 89 (05/15 1057) SPO2; FIO2:   Intake/Output Summary (Last 24 hours) at 09/27/13 1241 Last data filed at 09/27/13 0536  Gross per 24 hour  Intake 551.67 ml  Output   4200 ml  Net -3648.33 ml     Exam: General: A./O. x4, NAD, No acute respiratory distress Lungs: Clear to auscultation bilaterally without wheezes or crackles Cardiovascular: Tachycardic, regular rhythm without murmur gallop or rub normal S1 and S2 Abdomen: Nontender,distended, soft, bowel sounds positive, no rebound, no ascites, no appreciable mass, suprapubic cath in place negative signs symptoms of infection. Extremities: Bilateral lower extremity contracted in the leg toward the right. Right posterior calf stage III decubiti covered and clean negative sign of infection Skin; bilateral stage IV sacral decubiti clear drainage covered and clean no sign of infection.  Data Reviewed: Basic Metabolic Panel:  Recent Labs Lab 09/23/13 1117 09/23/13 2142 09/24/13 0540 09/25/13 0445 09/26/13 0520 09/27/13 0500  NA 142  --  144 145 142 146  K 4.4  --  3.9 3.5* 4.3 4.4  CL 102  --  105 107 106 109  CO2 26  --  27 26 25 27   GLUCOSE 110*  --  93 95 93 101*  BUN 25*  --  16 8 9 11   CREATININE 0.54 0.32* 0.44* 0.45* 0.45* 0.50  CALCIUM 9.1  --  8.6 8.9 8.9 8.9   Liver Function Tests:  Recent Labs Lab 09/24/13 0540 09/25/13 0445 09/26/13 0520 09/27/13 0500  AST 13 15 20 15   ALT 11 13 14 13   ALKPHOS 82  90 102 97  BILITOT <0.2* <0.2* <0.2* <0.2*  PROT 6.8 7.4 6.9 6.7  ALBUMIN 2.4* 2.6* 2.3* 2.3*   No results found for this basename: LIPASE, AMYLASE,  in the last 168 hours No results found for this basename: AMMONIA,  in the last 168 hours CBC:  Recent Labs Lab 09/23/13 1117 09/23/13 2142  09/24/13 0540 09/24/13 1200 09/25/13 0445 09/26/13 0520 09/27/13 0500  WBC 11.0* 7.4  --  8.6  --  8.2 8.2 7.0  NEUTROABS 7.2  --   --  5.7  --  5.0 5.5 4.6  HGB 8.9* 6.1*  < > 7.3* 7.2* 7.8* 7.6* 7.3*  HCT 30.0* 20.7*  < > 24.4* 24.4* 26.6* 25.8* 25.2*  MCV 77.9* 77.2*  --  77.2*  --  78.2 78.2 79.7  PLT 583* 402*  --  519*  --  551* 506* 478*  < > = values in this interval not displayed. Cardiac Enzymes: No results found for this basename: CKTOTAL, CKMB, CKMBINDEX, TROPONINI,  in the last 168 hours BNP (last 3 results)  Recent Labs  07/19/13 1938  PROBNP 79.5   CBG: No results found for this basename: GLUCAP,  in the last 168 hours  Recent Results (from the past 240 hour(s))  WOUND CULTURE     Status: None   Collection Time    09/23/13 10:50 AM      Result Value Ref Range Status  Specimen Description WOUND BUTTOCKS   Final   Special Requests NONE   Final   Gram Stain     Final   Value: NO WBC SEEN     NO SQUAMOUS EPITHELIAL CELLS SEEN     NO ORGANISMS SEEN     Performed at [facility]   Culture     Final   Value: FEW METHICILLIN RESISTANT STAPHYLOCOCCUS AUREUS     Note: RIFAMPIN AND GENTAMICIN SHOULD NOT BE USED AS SINGLE DRUGS FOR TREATMENT OF STAPH INFECTIONS. CRITICAL RESULT CALLED TO, READ BACK BY AND VERIFIED WITH: TAMMY P@0937  ON [id number] BY [facility]     Performed at [facility]   Report Status 09/26/2013 FINAL   Final   Organism ID, Bacteria METHICILLIN RESISTANT STAPHYLOCOCCUS AUREUS   Final  CULTURE, BLOOD (ROUTINE X 2)     Status: None   Collection Time    09/23/13 11:17 AM      Result Value Ref Range Status   Specimen Description BLOOD RIGHT HAND    Final   Special Requests BOTTLES DRAWN AEROBIC ONLY 5CC   Final   Culture  Setup Time     Final   Value: 09/23/2013 16:27     Performed at [facility]   Culture     Final   Value:        BLOOD CULTURE RECEIVED NO GROWTH TO DATE CULTURE WILL BE HELD FOR 5 DAYS BEFORE ISSUING A FINAL NEGATIVE REPORT     Performed at [facility]   Report Status PENDING   Incomplete  CULTURE, BLOOD (ROUTINE X 2)     Status: None   Collection Time    09/23/13  2:37 PM      Result Value Ref Range Status   Specimen Description BLOOD RIGHT HAND   Final   Special Requests BOTTLES DRAWN AEROBIC ONLY 2CC   Final   Culture  Setup Time     Final   Value: 09/23/2013 16:28     Performed at [facility]   Culture     Final   Value:        BLOOD CULTURE RECEIVED NO GROWTH TO DATE CULTURE WILL BE HELD FOR 5 DAYS BEFORE ISSUING A FINAL NEGATIVE REPORT     Performed at [facility]   Report Status PENDING   Incomplete  URINE CULTURE     Status: None   Collection Time    09/23/13  4:57 PM      Result Value Ref Range Status   Specimen Description URINE, CATHETERIZED   Final   Special Requests NONE   Final   Culture  Setup Time     Final   Value: 09/23/2013 23:39     Performed at [facility]     Final   Value: >=100,000 COLONIES/ML     Performed at [facility]   Culture     Final   Value: PROVIDENCIA STUARTII     Note: Two isolates with different morphologies were identified as the same organism.The most resistant organism was reported.     Performed at [facility]   Report Status 09/26/2013 FINAL   Final   Organism ID, Bacteria PROVIDENCIA STUARTII   Final  MRSA PCR SCREENING     Status: None   Collection Time    09/24/13 12:15 AM      Result Value Ref Range Status   MRSA by PCR NEGATIVE  NEGATIVE Final   Comment:            The GeneXpert MRSA Assay (FDA     approved for NASAL specimens     only), is one component of a      comprehensive MRSA colonization     surveillance program. It is not     intended to diagnose MRSA     infection nor to guide or     monitor treatment for     MRSA infections.     Studies:  Recent x-ray studies have been reviewed in detail by the Attending Physician  Scheduled Meds:  Scheduled Meds: . amLODipine  5 mg Oral Daily  . baclofen  20 mg Oral QID  . collagenase  1 application Topical Daily  . docusate sodium  100 mg Oral BID  . enoxaparin (LOVENOX) injection  60 mg Subcutaneous QHS  . famotidine  20 mg Oral BID  . ferrous [id number] C-folic acid  1 capsule Oral Daily  . lactose free nutrition  237 mL Oral BID BM  . metoCLOPramide  10 mg Oral TID AC  . multivitamin with minerals  1 tablet Oral q morning - 10a  . nutrition supplement (JUVEN)  1 packet Oral BID BM  . piperacillin-tazobactam (ZOSYN)  IV  3.375 g Intravenous Q8H  . potassium chloride SA  20 mEq Oral BID  . vancomycin  1,250 mg Intravenous Q12H  . zinc sulfate  220 mg Oral q morning - 10a    Time spent on care of this patient: 25 mins   Sheila Oats , MD   [facility] Office  [phone] Pager [phone]  On-Call/Text Page:      [email or web address]      password [email or web address]  If 7PM-7AM, please contact night-coverage [email or web address] Password [email or web address] 09/27/2013, 12:41 PM   LOS: 4 days

## 2013-09-27 NOTE — Progress Notes (Addendum)
INFECTIOUS DISEASE PROGRESS NOTE  ID: [name] [name] is a 48 y.o. male with  Active Problems:   Quadriplegia   Seizure disorder   HTN (hypertension)   Sacral decubitus ulcer, stage IV   S/P colostomy   Suprapubic catheter   OSA on CPAP   Anemia   Sepsis   UTI (lower urinary tract infection)  Subjective: C/o loose BP this AM.   Abtx:  Anti-infectives   Start     Dose/Rate Route Frequency Ordered Stop   09/24/13 1445  piperacillin-tazobactam (ZOSYN) IVPB 3.375 g     3.375 g 12.5 mL/hr over 240 Minutes Intravenous Every 8 hours 09/24/13 1434     09/24/13 1400  vancomycin (VANCOCIN) 1,250 mg in sodium chloride 0.9 % 250 mL IVPB     1,250 mg 166.7 mL/hr over 90 Minutes Intravenous Every 12 hours 09/23/13 2212     09/24/13 0130  vancomycin (VANCOCIN) 2,000 mg in sodium chloride 0.9 % 500 mL IVPB     2,000 mg 250 mL/hr over 120 Minutes Intravenous  Once 09/24/13 0124 09/24/13 0419   09/23/13 2145  vancomycin (VANCOCIN) IVPB 1000 mg/200 mL premix  Status:  Discontinued     1,000 mg 200 mL/hr over 60 Minutes Intravenous  Once 09/23/13 2137 09/24/13 1453   09/23/13 2145  vancomycin (VANCOCIN) IVPB 1000 mg/200 mL premix  Status:  Discontinued     1,000 mg 200 mL/hr over 60 Minutes Intravenous  Once 09/23/13 2138 09/24/13 1453   09/23/13 2045  piperacillin-tazobactam (ZOSYN) IVPB 3.375 g     3.375 g 12.5 mL/hr over 240 Minutes Intravenous  Once 09/23/13 2032 09/24/13 0107      Medications:  Scheduled: . amLODipine  5 mg Oral Daily  . baclofen  20 mg Oral QID  . collagenase  1 application Topical Daily  . docusate sodium  100 mg Oral BID  . enoxaparin (LOVENOX) injection  60 mg Subcutaneous QHS  . famotidine  20 mg Oral BID  . ferrous [id number] C-folic acid  1 capsule Oral Daily  . lactose free nutrition  237 mL Oral BID BM  . metoCLOPramide  10 mg Oral TID AC  . multivitamin with minerals  1 tablet Oral q morning - 10a  . nutrition supplement (JUVEN)  1 packet  Oral BID BM  . piperacillin-tazobactam (ZOSYN)  IV  3.375 g Intravenous Q8H  . potassium chloride SA  20 mEq Oral BID  . vancomycin  1,250 mg Intravenous Q12H  . zinc sulfate  220 mg Oral q morning - 10a    Objective: Vital signs in last 24 hours: Temp:  [97.6 F (36.4 C)-98.8 F (37.1 C)] 97.6 F (36.4 C) (05/15 1400) Pulse Rate:  [72-90] 72 (05/15 1400) Resp:  [18] 18 (05/15 1400) BP: (87-131)/(45-57) 131/54 mmHg (05/15 1400) SpO2:  [96 %-99 %] 99 % (05/15 1400) Weight:  [125 kg (275 lb 9.2 oz)] 125 kg (275 lb 9.2 oz) (05/15 0534)   General appearance: alert, cooperative and no distress Resp: clear to auscultation bilaterally Cardio: regular rate and rhythm GI: normal findings: bowel sounds normal and soft, non-tender  Lab Results  Recent Labs  09/26/13 0520 09/27/13 0500  WBC 8.2 7.0  HGB 7.6* 7.3*  HCT 25.8* 25.2*  NA 142 146  K 4.3 4.4  CL 106 109  CO2 25 27  BUN 9 11  CREATININE 0.45* 0.50   Liver Panel  Recent Labs  09/26/13 0520 09/27/13 0500  PROT 6.9 6.7  ALBUMIN 2.3*  2.3*  AST 20 15  ALT 14 13  ALKPHOS 102 97  BILITOT <0.2* <0.2*   Sedimentation Rate No results found for this basename: ESRSEDRATE,  in the last 72 hours C-Reactive Protein No results found for this basename: CRP,  in the last 72 hours  Microbiology: Recent Results (from the past 240 hour(s))  WOUND CULTURE     Status: None   Collection Time    09/23/13 10:50 AM      Result Value Ref Range Status   Specimen Description WOUND BUTTOCKS   Final   Special Requests NONE   Final   Gram Stain     Final   Value: NO WBC SEEN     NO SQUAMOUS EPITHELIAL CELLS SEEN     NO ORGANISMS SEEN     Performed at [facility]   Culture     Final   Value: FEW METHICILLIN RESISTANT STAPHYLOCOCCUS AUREUS     Note: RIFAMPIN AND GENTAMICIN SHOULD NOT BE USED AS SINGLE DRUGS FOR TREATMENT OF STAPH INFECTIONS. CRITICAL RESULT CALLED TO, READ BACK BY AND VERIFIED WITH: TAMMY P@[id number]  ON  [id number] BY [facility]     Performed at [facility]   Report Status 09/26/2013 FINAL   Final   Organism ID, Bacteria METHICILLIN RESISTANT STAPHYLOCOCCUS AUREUS   Final  CULTURE, BLOOD (ROUTINE X 2)     Status: None   Collection Time    09/23/13 11:17 AM      Result Value Ref Range Status   Specimen Description BLOOD RIGHT HAND   Final   Special Requests BOTTLES DRAWN AEROBIC ONLY 5CC   Final   Culture  Setup Time     Final   Value: 09/23/2013 16:27     Performed at [facility]   Culture     Final   Value:        BLOOD CULTURE RECEIVED NO GROWTH TO DATE CULTURE WILL BE HELD FOR 5 DAYS BEFORE ISSUING A FINAL NEGATIVE REPORT     Performed at [facility]   Report Status PENDING   Incomplete  CULTURE, BLOOD (ROUTINE X 2)     Status: None   Collection Time    09/23/13  2:37 PM      Result Value Ref Range Status   Specimen Description BLOOD RIGHT HAND   Final   Special Requests BOTTLES DRAWN AEROBIC ONLY 2CC   Final   Culture  Setup Time     Final   Value: 09/23/2013 16:28     Performed at [facility]   Culture     Final   Value:        BLOOD CULTURE RECEIVED NO GROWTH TO DATE CULTURE WILL BE HELD FOR 5 DAYS BEFORE ISSUING A FINAL NEGATIVE REPORT     Performed at [facility]   Report Status PENDING   Incomplete  URINE CULTURE     Status: None   Collection Time    09/23/13  4:57 PM      Result Value Ref Range Status   Specimen Description URINE, CATHETERIZED   Final   Special Requests NONE   Final   Culture  Setup Time     Final   Value: 09/23/2013 23:39     Performed at [place]     Final   Value: >=100,000 COLONIES/ML     Performed at [place]     Final  Value: PROVIDENCIA STUARTII     Note: Two isolates with different morphologies were identified as the same organism.The most resistant organism was reported.     Performed at [facility]   Report Status 09/26/2013 FINAL   Final    Organism ID, Bacteria PROVIDENCIA STUARTII   Final  MRSA PCR SCREENING     Status: None   Collection Time    09/24/13 12:15 AM      Result Value Ref Range Status   MRSA by PCR NEGATIVE  NEGATIVE Final   Comment:            The GeneXpert MRSA Assay (FDA     approved for NASAL specimens     only), is one component of a     comprehensive MRSA colonization     surveillance program. It is not     intended to diagnose MRSA     infection nor to guide or     monitor treatment for     MRSA infections.    Studies/Results: Dg Abd Portable 1v  09/25/2013   CLINICAL DATA:  Abdominal distension, hypotension, history quadriplegia  EXAM: PORTABLE ABDOMEN - 1 VIEW  COMPARISON:  Portable exam 1722 hr compared to 01/30/2013  FINDINGS: Gaseous distention of stomach.  Increased stool in rectum.  Bowel gas pattern otherwise normal.  No bowel wall thickening.  Bones demineralized with degenerative changes and pseudo acetabularization at RIGHT hip.  IMPRESSION: Gaseous distention of stomach.  Mildly increased stool in rectum.   Electronically Signed   By: Lavonia Dana M.D.   On: 09/25/2013 17:36     Assessment/Plan: Sepsis  Osteomyelitis,  Wound Cx MRSA Recurrent UTI hypotension  Total days of antibiotics 4 vanco/zosyn  Would  Stop vanco Start pt on po bactrim at d/c, to continue on this til he has ID f/u. I am not sure of the significance of this Cx given he had a wound swab.  Change zosyn to ceftriaxone, 6 more days Consider long term placement for maximal wound care Improved BP mgmt F/u ID clinic 1 month Available if questions.          [facility] (pager) [phone] [email or web address] 09/27/2013, 2:36 PM  LOS: 4 days   **Disclaimer: This note may have been dictated with voice recognition software. Similar sounding words can inadvertently be transcribed and this note may contain transcription errors which may not have been corrected upon publication of  note.**

## 2013-09-27 NOTE — Progress Notes (Signed)
Placed pt on cpap for rest, 19cm h2o per home settings with full face mask as he wears this at home.  Pt tolerating well at this time.  Sterile water added to humidity chamber.  HR81, sats97%.

## 2013-09-28 LAB — COMPREHENSIVE METABOLIC PANEL
ALT: 16 U/L (ref 0–53)
AST: 17 U/L (ref 0–37)
Albumin: 2.4 g/dL — ABNORMAL LOW (ref 3.5–5.2)
Alkaline Phosphatase: 103 U/L (ref 39–117)
BUN: 14 mg/dL (ref 6–23)
CALCIUM: 9.2 mg/dL (ref 8.4–10.5)
CO2: 28 meq/L (ref 19–32)
CREATININE: 0.45 mg/dL — AB (ref 0.50–1.35)
Chloride: 108 mEq/L (ref 96–112)
GFR calc Af Amer: >90 mL/min (ref >90)
Glucose, Bld: 102 mg/dL — ABNORMAL HIGH (ref 70–99)
Potassium: 4.6 mEq/L (ref 3.7–5.3)
Sodium: 145 mEq/L (ref 137–147)
Total Bilirubin: <0.2 mg/dL — ABNORMAL LOW (ref 0.3–1.2)
Total Protein: 6.8 g/dL (ref 6.0–8.3)

## 2013-09-28 LAB — CBC WITH DIFFERENTIAL/PLATELET
Basophils Absolute: 0 10*3/uL (ref 0.0–0.1)
Basophils Relative: 0 % (ref 0–1)
EOS ABS: 0.4 10*3/uL (ref 0.0–0.7)
Eosinophils Relative: 5 % (ref 0–5)
HCT: 26.2 % — ABNORMAL LOW (ref 39.0–52.0)
HEMOGLOBIN: 7.6 g/dL — AB (ref 13.0–17.0)
Lymphocytes Relative: 23 % (ref 12–46)
Lymphs Abs: 1.8 10*3/uL (ref 0.7–4.0)
MCH: 23.3 pg — AB (ref 26.0–34.0)
MCHC: 29 g/dL — ABNORMAL LOW (ref 30.0–36.0)
MCV: 80.4 fL (ref 78.0–100.0)
MONOS PCT: 8 % (ref 3–12)
Monocytes Absolute: 0.6 10*3/uL (ref 0.1–1.0)
NEUTROS ABS: 4.9 10*3/uL (ref 1.7–7.7)
NEUTROS PCT: 64 % (ref 43–77)
Platelets: 457 10*3/uL — ABNORMAL HIGH (ref 150–400)
RBC: 3.26 MIL/uL — ABNORMAL LOW (ref 4.22–5.81)
RDW: 16.9 % — ABNORMAL HIGH (ref 11.5–15.5)
WBC: 7.8 10*3/uL (ref 4.0–10.5)

## 2013-09-28 MED ORDER — AMLODIPINE BESYLATE 2.5 MG PO TABS: 2.5000 mg | ORAL_TABLET | Freq: Every morning | ORAL | Status: DC

## 2013-09-28 MED ORDER — DEXTROSE 5 % IV SOLN: 1.0000 g | INTRAVENOUS | Status: DC

## 2013-09-28 MED ORDER — FUROSEMIDE 20 MG PO TABS: 20.0000 mg | ORAL_TABLET | Freq: Every day | ORAL | Status: DC

## 2013-09-28 MED ORDER — COLLAGENASE 250 UNIT/GM EX OINT: 1.0000 "application " | TOPICAL_OINTMENT | Freq: Every day | CUTANEOUS | Status: DC

## 2013-09-28 MED ORDER — JUVEN PO PACK: 1.0000 | PACK | Freq: Two times a day (BID) | ORAL | Status: DC

## 2013-09-28 MED ORDER — SULFAMETHOXAZOLE-TRIMETHOPRIM 400-80 MG PO TABS: 1.0000 | ORAL_TABLET | Freq: Two times a day (BID) | ORAL | Status: DC

## 2013-09-28 MED ORDER — HEPARIN SOD (PORK) LOCK FLUSH 100 UNIT/ML IV SOLN
250.0000 [IU] | INTRAVENOUS | Status: AC | PRN
  Administered 2013-09-28: 500 [IU]

## 2013-09-28 MED ORDER — POTASSIUM CHLORIDE CRYS ER 20 MEQ PO TBCR: 20.0000 meq | EXTENDED_RELEASE_TABLET | Freq: Every day | ORAL | Status: DC

## 2013-09-28 NOTE — Progress Notes (Signed)
Placed patient on CPAP via full face mask, 16.0 cm H20 per patient reques, RN aware..  Patient states his home settings are 19.0 cm H20 however he stated that last night he wore CPAP at 19.0 cn H20 and he felt that was too much pressure.  He stated he was hard to arouse this morning and he felt lightheaded for a few hours.  RT changed settings to 16.0 per patient comfort.  Chamber filled with distilled H20 for humidity.  Patient tolerating well at this time.  HR 78, SpO2 99%.

## 2013-09-28 NOTE — Clinical Social Work Note (Signed)
CSW informed patient is ready for d/c and will need assistance with transportation home. CSW met with patient and verified home address. CSW to arrange transportation for patient via [facility]. No further needs identified. CSW signing off.  [place], [place] Weekend Clinical Social Worker [phone]
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
## 2013-09-29 LAB — CULTURE, BLOOD (ROUTINE X 2)
CULTURE: NO GROWTH
Culture: NO GROWTH

## 2013-10-01 NOTE — Progress Notes (Signed)
[facility]  NAME:  [name] [name], [name] [name] NO.:  [id number]  MEDICAL RECORD NO.:  [id number]      DATE OF BIRTH:  [birth year]  PHYSICIAN:  [name], DO       VISIT DATE:  09/30/2013                                  OFFICE VISIT   HISTORY:  The patient is a 48 year old male who is here for followup on his sacral ulcers, right shoulder ulcer, and right leg ulcer.  He has been using Silvadene on the shoulder, wet to dries on the sacrum and Santyl on the legs.  REVIEW OF SYSTEMS:  He was recently admitted and underwent IV therapy. It is not clear why he had the admission.  He has undergone several admissions for UTIs in the recent past.  PHYSICAL EXAMINATION:  He is very pleasant like usual.  He is alert, oriented, and cooperative.  He is not in any acute distress.  ASSESSMENT:  The shoulder ulcer is improving with a small yellowish eschar in the middle, but no sign of infection.  The leg ulcers were debrided.  The sacral ulcer looks clean.  Recommend continuing with wet- to-dry dressings on the sacrum, Silvadene on the shoulder, Santyl on the legs, and we will see the patient back in followup in 3 months.  He is to call me if for any reason he gets admitted.     [name], DO     CS/MEDQ  D:  09/30/2013  T:  10/01/2013  Job:  [id number]

## 2013-10-06 ENCOUNTER — Encounter (HOSPITAL_COMMUNITY): Payer: Self-pay | Admitting: Emergency Medicine

## 2013-10-06 ENCOUNTER — Emergency Department (HOSPITAL_COMMUNITY)
Admission: EM | Admit: 2013-10-06 | Discharge: 2013-10-06 | Disposition: A | Discharge status: Home / Self Care | Payer: Medicare Other | Attending: Emergency Medicine | Admitting: Emergency Medicine

## 2013-10-06 DIAGNOSIS — Z792 Long term (current) use of antibiotics: Secondary | ICD-10-CM | POA: Insufficient documentation

## 2013-10-06 DIAGNOSIS — Z8701 Personal history of pneumonia (recurrent): Secondary | ICD-10-CM | POA: Insufficient documentation

## 2013-10-06 DIAGNOSIS — I10 Essential (primary) hypertension: Secondary | ICD-10-CM | POA: Insufficient documentation

## 2013-10-06 DIAGNOSIS — E662 Morbid (severe) obesity with alveolar hypoventilation: Secondary | ICD-10-CM | POA: Insufficient documentation

## 2013-10-06 DIAGNOSIS — D649 Anemia, unspecified: Secondary | ICD-10-CM | POA: Insufficient documentation

## 2013-10-06 DIAGNOSIS — T839XXA Unspecified complication of genitourinary prosthetic device, implant and graft, initial encounter: Secondary | ICD-10-CM

## 2013-10-06 DIAGNOSIS — Y846 Urinary catheterization as the cause of abnormal reaction of the patient, or of later complication, without mention of misadventure at the time of the procedure: Secondary | ICD-10-CM | POA: Insufficient documentation

## 2013-10-06 DIAGNOSIS — Z872 Personal history of diseases of the skin and subcutaneous tissue: Secondary | ICD-10-CM | POA: Insufficient documentation

## 2013-10-06 DIAGNOSIS — T83091A Other mechanical complication of indwelling urethral catheter, initial encounter: Secondary | ICD-10-CM | POA: Insufficient documentation

## 2013-10-06 DIAGNOSIS — Z8669 Personal history of other diseases of the nervous system and sense organs: Secondary | ICD-10-CM | POA: Insufficient documentation

## 2013-10-06 DIAGNOSIS — Z8719 Personal history of other diseases of the digestive system: Secondary | ICD-10-CM | POA: Insufficient documentation

## 2013-10-06 DIAGNOSIS — Z87440 Personal history of urinary (tract) infections: Secondary | ICD-10-CM | POA: Insufficient documentation

## 2013-10-06 DIAGNOSIS — Z8619 Personal history of other infectious and parasitic diseases: Secondary | ICD-10-CM | POA: Insufficient documentation

## 2013-10-06 DIAGNOSIS — Z79899 Other long term (current) drug therapy: Secondary | ICD-10-CM | POA: Insufficient documentation

## 2013-10-06 DIAGNOSIS — J45909 Unspecified asthma, uncomplicated: Secondary | ICD-10-CM | POA: Insufficient documentation

## 2013-10-06 DIAGNOSIS — Z8739 Personal history of other diseases of the musculoskeletal system and connective tissue: Secondary | ICD-10-CM | POA: Insufficient documentation

## 2013-10-06 NOTE — Discharge Instructions (Signed)
Follow up with your Urologist as needed - Your catheter is in the right place

## 2013-10-06 NOTE — ED Notes (Signed)
Bed: VO59 Expected date:  Expected time:  Means of arrival:  Comments: EMS 48yo M suprapubic cath out

## 2013-10-06 NOTE — ED Notes (Signed)
MD notified of pt's BP. 
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
## 2013-10-06 NOTE — ED Notes (Signed)
Per EMS ,pt. From home with a complaint of supra pubic catheter pulled out around 11pm last night, denied any pain, pt is a paraplegic, alert and oriented x4.

## 2013-10-09 IMAGING — CR DG PELVIS 1-2V
2 series · 2 of 2 positions shown · non-contrast
Comparison: 12/11/2009

CLINICAL DATA: Pain, ulcers.

PELVIS - 1-2 VIEW

[t pelvis a.p. (1 of 2)]
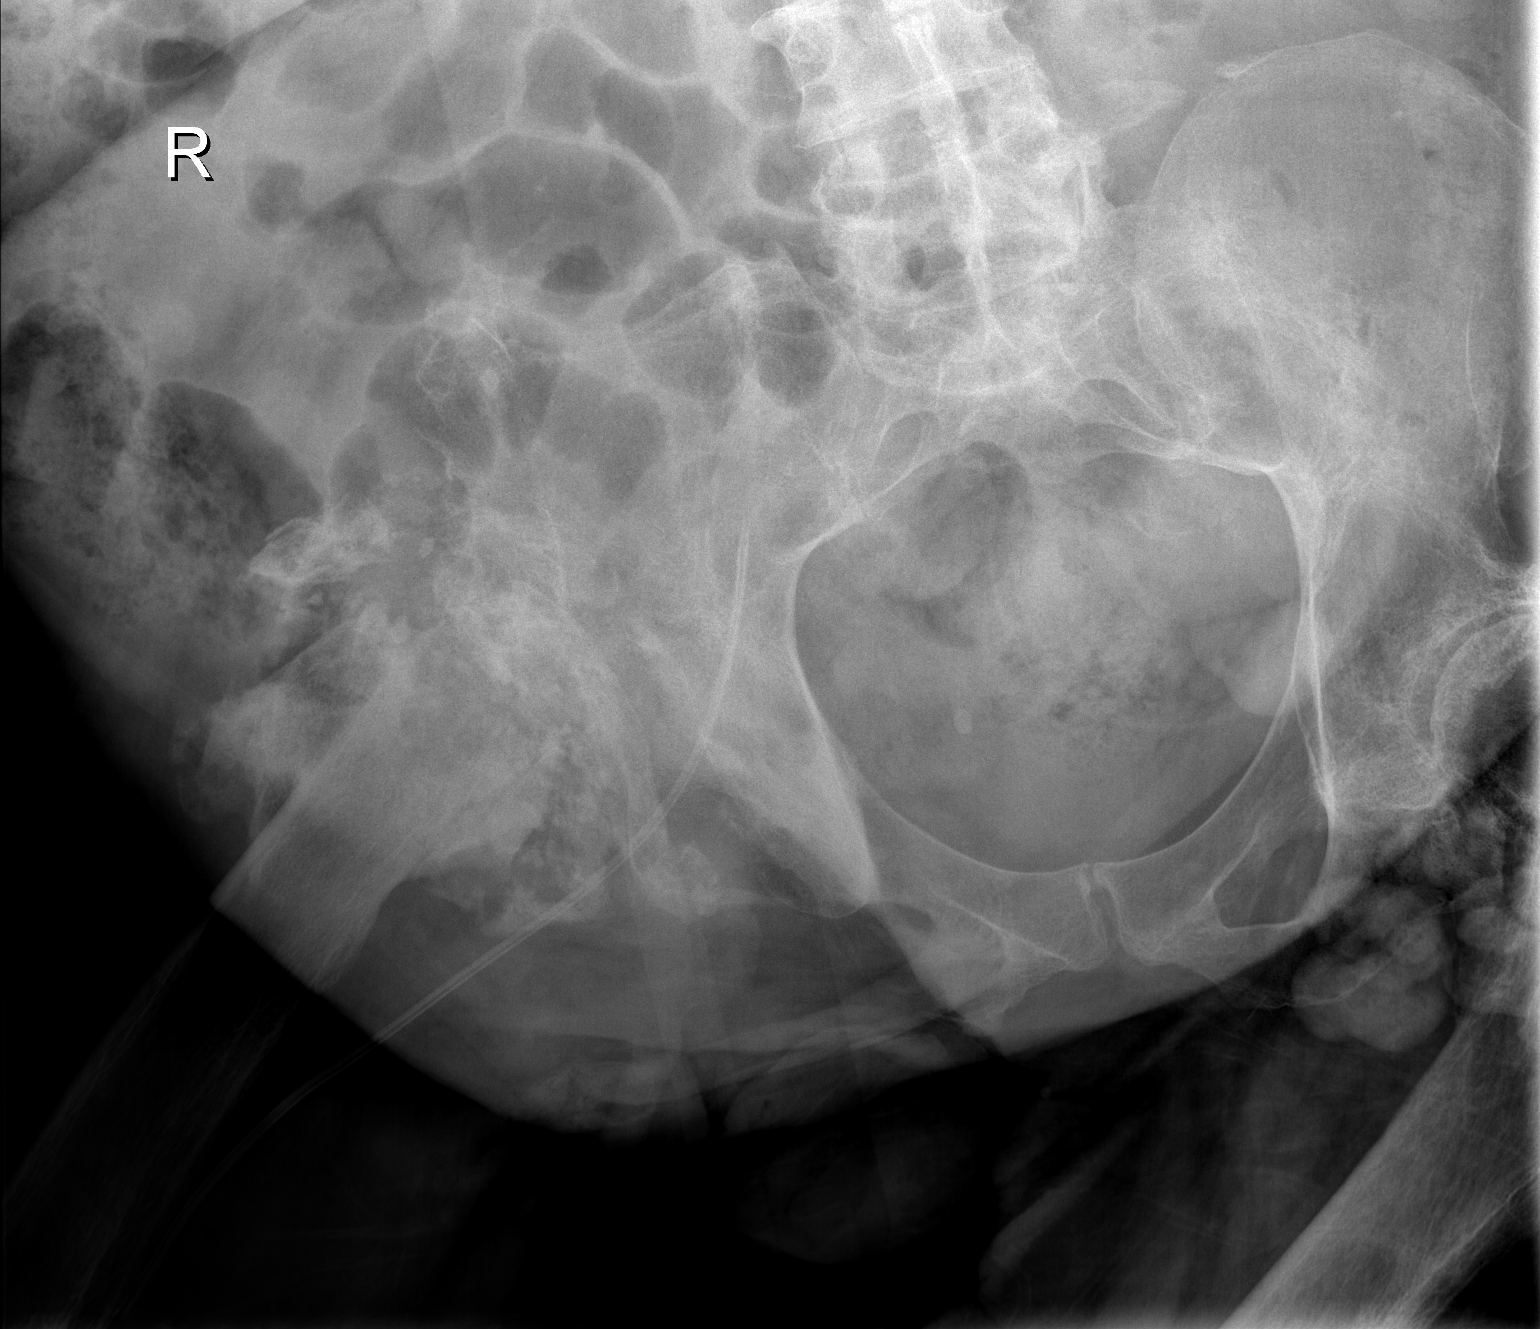

[t pelvis a.p. (2 of 2)]
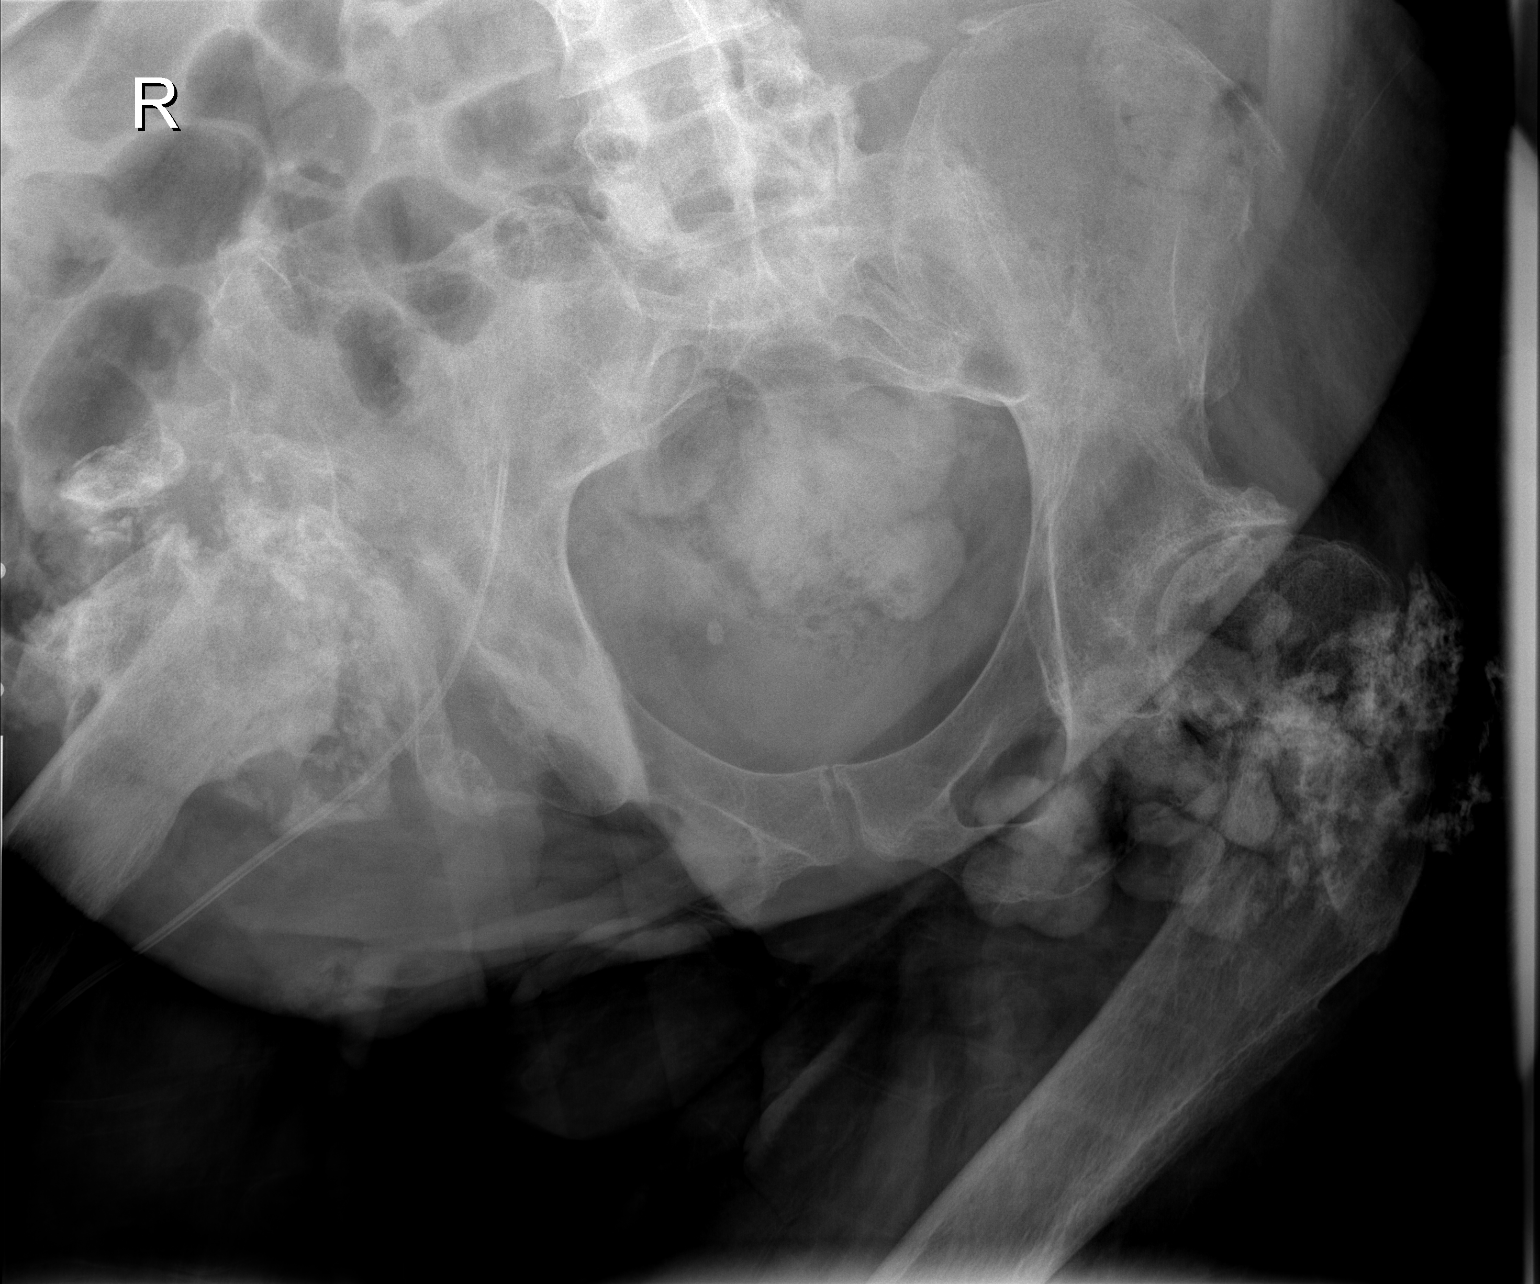

[2 of 2 positions shown; findings below may reference images not displayed]

FINDINGS: Advanced deformity of the bilateral hip joint with
heterotopic ossification. Bowel gas overlies the right hip joint
and sacrum, which limits evaluation.  There is a right-sided
catheter with tip projecting over the right lower pelvis.
IMPRESSION: Advanced chronic osseous changes of the hips.  Not possible to
exclude a superimposed acute process.

## 2013-10-21 ENCOUNTER — Encounter: Payer: Self-pay | Admitting: Infectious Diseases

## 2013-10-21 ENCOUNTER — Ambulatory Visit (INDEPENDENT_AMBULATORY_CARE_PROVIDER_SITE_OTHER): Payer: Medicare Other | Admitting: Infectious Diseases

## 2013-10-21 VITALS — BP 115/75 | HR 109 | Temp 98.2°F

## 2013-10-21 DIAGNOSIS — L8994 Pressure ulcer of unspecified site, stage 4: Secondary | ICD-10-CM

## 2013-10-21 DIAGNOSIS — L89154 Pressure ulcer of sacral region, stage 4: Secondary | ICD-10-CM

## 2013-10-21 DIAGNOSIS — L89109 Pressure ulcer of unspecified part of back, unspecified stage: Secondary | ICD-10-CM

## 2013-10-21 NOTE — Progress Notes (Signed)
Subjective:    Patient ID: [name] [name], male    DOB: 1965/06/15, 48 y.o.   MRN: [id number]  HPI 48 y.o. M with quadraplegia (C5 fracture 1991) and nonhealing sacral wound for the past 3 years. He has had multiple prolonged courses of antibiotic therapy recently. He was hospitalized on August 02, 2011 with right hip osteomyelitis. Was transferred to [facility] where a hip aspirate grew methicillin sensitive coagulase-negative staph and Candida. He received a long course of IV ertapenem and oral fluconazole. He was readmitted from April 19-25 and switched to vancomycin and ertapenem and received continued therapy. He was admitted again from June 25 to July 2. Bone biopsy grew Enterobacter and he was started on ertapenem again received 8 weeks of therapy completing that on September 1st. He was seen by Dr. Megan Salon and Dr. Baxter Flattery and they felt more likely admission complaints of fever, etc were related to pseudomonas UTI rather than his chronic pelvic infection.   He then returned 02-20-13 with fevers. He was found on CT to have an abscess 6.8 x 3.6 x 5.9 c with gas in hip joint, left side, with no changes in chronic right sacral decub and air tracking to right hip joint. He had urine and blood cultures drawn and placed on vancomycin and imipenem.   His UCx grew > 100k E cloacae (S-aminoglycosides, FLQ, Bactrim) for which he received fosfomycin.   His abscess was aspirated by IR on 02-22-13 and grew E coli (R flq and bactrim). He was treated with IV ceftriaxone and oral flagyl with a plan for 6 weeks of therapy (end Nov 19).   He was seen in ID clinic in f/u 03-25-13. He was doing well.   He was seen in ID clinic 04-24-13 with concerns about increasing wound d/c. He was sent to [facility], not restated on anbx.   Was seen in ID on 05-27-13, he was concerned about increasing wound d/c. He was kept off anbx. He continued to have f/u with [facility].   On 06-21-13 he was admitted to the  hospital with fever. He was started on vanco/zosyn. He underwent MRI: 1. There are chronic destructive osseous changes of bilateral hips with irregular soft tissue enhancement within bilateral hip joints and adjacent fluid collections. There is no significant marrow signal abnormality. The overall appearance is consistent with chronic osteomyelitis with the fluid collections concerning for abscesses. Overall there is no significant changes in the appearance of bilateral hips compared with the CT of the pelvis performed on 02/27/2013. 2. There is a sacral decubitus ulcer overlying the left SI joint with mild marrow edema of the posterior left sacrum and ilium with enhancement concerning for osteomyelitis. He underwent IR drain of his fluid collection, Cx was (-). He had UCx which grew Providencia, Pseudomonas. He was d/c home on 2-16. He was seen in ID clinic on 3-16 and was doing well. He had surgical f/u (not a surgical candidate) and was then transitioned to PO doxy (08-13-13).   He had ID f/u planned for late April but changed this to get second opinion at [facility].  He was continued on his doxy until admitted.   Comes to ED 5-12 worsening decubitus drainage. He was told this had increased particulate d/c. In ED was found to be hypotensive and WBC 11. His pelvic MRI was repeated and felt to be unchanged from his previous in October 2014. He was started on vanco/zosyn. Since admission has been noted to be more  anemic.   He had Bcx 5-11 (ngtd), pyuria/bacturia on UA, UCx (Providencia),  Wound C (staph aureus).  He was transitioned to ceftriaxone alone for a short course (~ 1 week) then transitioned to PO bactrim.  Was able to be d/c home on 09-30-13.   He feels well. No fever or chills. States that the home health nurse feels that his sacral wound is better. No d/c except for one small area where there is a new tunnel. This drainage has been improving. Has been back to [facility]/Dr Sanger since d/c. Still deferring  surgery due to wound depth. May get possible bone bx at next admit.  No problems with urine or BM.   Review of Systems     Objective:   Physical Exam  Constitutional: He appears well-developed and well-nourished.  HENT:  Mouth/Throat: No oropharyngeal exudate.  Eyes: EOM are normal. Pupils are equal, round, and reactive to light.  Cardiovascular: Normal rate, regular rhythm and normal heart sounds.   Pulmonary/Chest: Effort normal and breath sounds normal.    Abdominal: Soft. Bowel sounds are normal. There is no tenderness.          Assessment & Plan:

## 2013-10-21 NOTE — Assessment & Plan Note (Signed)
Will remove his PIC line. Will continue him on bactrim while we await further wound f/u.  He appears to be doing fairly well. rtc 3-4 months.

## 2013-10-22 ENCOUNTER — Telehealth: Payer: Self-pay | Admitting: *Deleted

## 2013-10-22 ENCOUNTER — Other Ambulatory Visit: Payer: Self-pay | Admitting: Infectious Diseases

## 2013-10-22 DIAGNOSIS — L89109 Pressure ulcer of unspecified part of back, unspecified stage: Secondary | ICD-10-CM

## 2013-10-22 NOTE — Telephone Encounter (Signed)
Patient notified of appointment at [facility] for picc removal on Friday 10/25/13 at 12:30 pm.  [name] [name]

## 2013-10-25 ENCOUNTER — Ambulatory Visit (HOSPITAL_COMMUNITY)
Admission: RE | Admit: 2013-10-25 | Discharge: 2013-10-25 | Disposition: A | Discharge status: Home / Self Care | Payer: Medicare Other | Source: Ambulatory Visit | Attending: Infectious Diseases | Admitting: Infectious Diseases

## 2013-10-25 DIAGNOSIS — L89109 Pressure ulcer of unspecified part of back, unspecified stage: Secondary | ICD-10-CM

## 2013-10-25 DIAGNOSIS — Z452 Encounter for adjustment and management of vascular access device: Secondary | ICD-10-CM | POA: Insufficient documentation

## 2013-10-25 MED ORDER — CHLORHEXIDINE GLUCONATE 4 % EX LIQD
CUTANEOUS | Status: DC
Start: 2013-10-25 — End: 2013-10-26
  Filled 2013-10-25: qty 15

## 2013-10-25 NOTE — Procedures (Signed)
Successful removal of tunneled PICC.  No immediate complications.

## 2013-10-29 ENCOUNTER — Telehealth: Payer: Self-pay | Admitting: *Deleted

## 2013-10-29 NOTE — Telephone Encounter (Signed)
Verbal order per [name] given to [facility] at [facility] for wound care nurse to evaluate patient and make recommendations regarding dressing his wounds. [name]

## 2013-10-30 ENCOUNTER — Emergency Department (HOSPITAL_COMMUNITY)
Admission: EM | Admit: 2013-10-30 | Discharge: 2013-10-30 | Discharge status: Against Medical Advice | Payer: Medicare Other | Attending: Emergency Medicine | Admitting: Emergency Medicine

## 2013-10-30 ENCOUNTER — Encounter (HOSPITAL_COMMUNITY): Payer: Self-pay | Admitting: Emergency Medicine

## 2013-10-30 DIAGNOSIS — I10 Essential (primary) hypertension: Secondary | ICD-10-CM | POA: Insufficient documentation

## 2013-10-30 DIAGNOSIS — L89209 Pressure ulcer of unspecified hip, unspecified stage: Secondary | ICD-10-CM | POA: Insufficient documentation

## 2013-10-30 DIAGNOSIS — J45909 Unspecified asthma, uncomplicated: Secondary | ICD-10-CM | POA: Insufficient documentation

## 2013-10-30 NOTE — ED Notes (Signed)
Pt is wheelchair bound.  Has had a decubitus ulcer on his bottom x 2 years.  Pt was sent here by home health nurse because "the bone is showing more than usual" and there is "fluid build up".

## 2013-10-30 NOTE — ED Notes (Signed)
Called for pt twice in lobby, no answer. No one in restroom or outside.

## 2013-10-31 ENCOUNTER — Emergency Department (HOSPITAL_COMMUNITY)
Admission: EM | Admit: 2013-10-31 | Discharge: 2013-10-31 | Disposition: A | Discharge status: Home / Self Care | Payer: Medicare Other | Attending: Emergency Medicine | Admitting: Emergency Medicine

## 2013-10-31 ENCOUNTER — Encounter (HOSPITAL_COMMUNITY): Payer: Self-pay | Admitting: Emergency Medicine

## 2013-10-31 ENCOUNTER — Ambulatory Visit: Payer: Medicare Other | Admitting: Infectious Diseases

## 2013-10-31 DIAGNOSIS — I10 Essential (primary) hypertension: Secondary | ICD-10-CM | POA: Insufficient documentation

## 2013-10-31 DIAGNOSIS — Z87440 Personal history of urinary (tract) infections: Secondary | ICD-10-CM | POA: Insufficient documentation

## 2013-10-31 DIAGNOSIS — L8990 Pressure ulcer of unspecified site, unspecified stage: Secondary | ICD-10-CM

## 2013-10-31 DIAGNOSIS — Z862 Personal history of diseases of the blood and blood-forming organs and certain disorders involving the immune mechanism: Secondary | ICD-10-CM | POA: Insufficient documentation

## 2013-10-31 DIAGNOSIS — Z8739 Personal history of other diseases of the musculoskeletal system and connective tissue: Secondary | ICD-10-CM | POA: Insufficient documentation

## 2013-10-31 DIAGNOSIS — Z8719 Personal history of other diseases of the digestive system: Secondary | ICD-10-CM | POA: Insufficient documentation

## 2013-10-31 DIAGNOSIS — Z8669 Personal history of other diseases of the nervous system and sense organs: Secondary | ICD-10-CM | POA: Insufficient documentation

## 2013-10-31 DIAGNOSIS — J45909 Unspecified asthma, uncomplicated: Secondary | ICD-10-CM | POA: Insufficient documentation

## 2013-10-31 DIAGNOSIS — Z8619 Personal history of other infectious and parasitic diseases: Secondary | ICD-10-CM | POA: Insufficient documentation

## 2013-10-31 DIAGNOSIS — Z79899 Other long term (current) drug therapy: Secondary | ICD-10-CM | POA: Insufficient documentation

## 2013-10-31 MED ORDER — SODIUM CHLORIDE 0.9 % IV SOLN: INTRAVENOUS | Status: DC

## 2013-10-31 NOTE — ED Notes (Signed)
Bed: MA26 Expected date:  Expected time:  Means of arrival:  Comments: decub

## 2013-10-31 NOTE — ED Notes (Signed)
Per EMS- Patient has chronic decubitus. Home health nurse visited the patient 2 days ago and reported increased protrusion of a bone on the right buttock decubitus.Decubitus has been a stage III, but increased bone protrusion.
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
## 2013-10-31 NOTE — ED Notes (Signed)
Triage assessment completed by Elaina Hoops, RN and not Ronney Lion.

## 2013-10-31 NOTE — Discharge Instructions (Signed)
Follow up with sanger--return here for fever or green/yellow drainage from your wounds

## 2013-10-31 NOTE — ED Notes (Signed)
[facility] called for transportation back home. 

## 2013-11-01 ENCOUNTER — Telehealth: Payer: Self-pay | Admitting: *Deleted

## 2013-11-01 NOTE — Telephone Encounter (Signed)
Amber with [facility] called to advise that the patient R Hip wound is getting worse. She advised it is 4-5 cm deep and the gauze snages on the exposed bone when they are doing dressing changes. She also reports to be able to feel the bone crumble when touched. She advised that the homecare wound staff do not think they are able to effectively care for this wound. She advised that when moving the patient leg you can hear fluid in the wound. He does not have a fever or appear to be in increased pain. She reports calling Wound Care (Dr Inocencio Homes 10/29/13 and has not gotten an answer from them. She advised the patient to go to the ED if fever or pain increase however she feels he does not get effective care due to his frequent ED visits. She does not know what to do at this point and wants Dr [name] to advise her from here. Advised her will send him a note to see what he would advise them to do for the patient. Going further.  Patient did go to the ED 10/31/13 please see note. His next appt with Dr [name] is 12/30/13.

## 2013-11-01 NOTE — Telephone Encounter (Signed)
Please get ASAP at [facility] or send to plastics at [facility] thanks

## 2013-11-08 ENCOUNTER — Other Ambulatory Visit: Payer: Self-pay | Admitting: *Deleted

## 2013-11-08 MED ORDER — SULFAMETHOXAZOLE-TRIMETHOPRIM 400-80 MG PO TABS: 1.0000 | ORAL_TABLET | Freq: Two times a day (BID) | ORAL | Status: DC

## 2013-11-08 NOTE — Telephone Encounter (Signed)
Patient's appointment with Dr. [name] was moved up to Monday 11/11/13. [name] [name]

## 2013-11-11 ENCOUNTER — Encounter (HOSPITAL_BASED_OUTPATIENT_CLINIC_OR_DEPARTMENT_OTHER): Payer: Medicare Other | Attending: Plastic Surgery

## 2013-11-11 DIAGNOSIS — L98499 Non-pressure chronic ulcer of skin of other sites with unspecified severity: Secondary | ICD-10-CM | POA: Insufficient documentation

## 2013-11-11 DIAGNOSIS — G8220 Paraplegia, unspecified: Secondary | ICD-10-CM | POA: Insufficient documentation

## 2013-11-11 NOTE — Progress Notes (Signed)
[facility]  NAME:  [name], [name] NO.:  [id number]  MEDICAL RECORD NO.:  [id number]      DATE OF BIRTH:  07-Sep-1965  PHYSICIAN:  [name], DO       VISIT DATE:  11/11/2013                                  OFFICE VISIT   The patient is a 48 year old paraplegic male who is here for a followup on his bilateral sacral ischial ulcers and had right back ulcer.  He is overall doing better.  The wound on the ulcer is improving.  He has a eschar but it is healing in from the under surface.  The ulcers on his sacral ischial area have improved for some of them, but then he has a new one and one that it has gotten deeper.  He has had trouble offloading because he has had some pain in his shoulders.  There is no change in his medications, social history otherwise.  RECOMMENDATION:  The back was debrided here in the office as well as the sacral area.  Recommend consult with Dr. Ninfa Linden for evaluation of his shoulder that is possibly dislocating.  He will have an appointment with his urologist for the drainage he is getting around his Foley catheter that is contributing to contamination of his wound, and we will see him back in a couple of weeks.  In the meantime, he is to continue with his protein supplements.     [name], DO     CS/MEDQ  D:  11/11/2013  T:  11/11/2013  Job:  [id number]

## 2013-12-07 ENCOUNTER — Other Ambulatory Visit: Payer: Self-pay | Admitting: Infectious Diseases

## 2013-12-09 ENCOUNTER — Encounter (HOSPITAL_BASED_OUTPATIENT_CLINIC_OR_DEPARTMENT_OTHER): Payer: Medicare Other | Attending: Plastic Surgery

## 2013-12-09 DIAGNOSIS — N508 Other specified disorders of male genital organs: Secondary | ICD-10-CM | POA: Insufficient documentation

## 2013-12-09 DIAGNOSIS — L98499 Non-pressure chronic ulcer of skin of other sites with unspecified severity: Secondary | ICD-10-CM | POA: Insufficient documentation

## 2013-12-10 NOTE — Progress Notes (Signed)
[facility]  NAME:  [name] [name], [name] [name] NO.:  [id number]  MEDICAL RECORD NO.:  [id number]      DATE OF BIRTH:  1965-12-04  PHYSICIAN:  [name], DO       VISIT DATE:  12/09/2013                                  OFFICE VISIT   HISTORY OF PRESENT ILLNESS:  The patient is a 48 year old here for a followup on his sacral ulcer and right shoulder ulcer.  He has been doing wet-to-dry dressings at home with some improvement in some of them and some areas have gotten worse.  There is no change in his medication or social history.  PHYSICAL EXAMINATION:  GENERAL:  On exam, he is alert and oriented, cooperative, not in any acute distress.  He is pleasant. HEENT:  Pupils are equal.  Extraocular muscles are intact. NECK:  He does not have any cervical lymphadenopathy. CHEST:  His breathing is unlabored. HEART:  His heart rate is regular.  He is very pleasant as usual.  Debridement was done of the right ischial ulcer, the shoulder ulcer, and the scrotal ulcer, and __________ noted.  We will do Santyl wet-to-dry dressings and follow up in 3 weeks.  We will also check a prealbumin.     [name], DO     CS/MEDQ  D:  12/09/2013  T:  12/10/2013  Job:  [id number]

## 2013-12-21 ENCOUNTER — Encounter (HOSPITAL_COMMUNITY): Payer: Self-pay | Admitting: Emergency Medicine

## 2013-12-21 ENCOUNTER — Inpatient Hospital Stay (HOSPITAL_COMMUNITY)
Admission: EM | Admit: 2013-12-21 | Discharge: 2014-01-06 | DRG: 853 | Disposition: A | Discharge status: Home Health | Payer: Medicare Other | Attending: Internal Medicine | Admitting: Internal Medicine

## 2013-12-21 DIAGNOSIS — G40909 Epilepsy, unspecified, not intractable, without status epilepticus: Secondary | ICD-10-CM | POA: Diagnosis present

## 2013-12-21 DIAGNOSIS — L89313 Pressure ulcer of right buttock, stage 3: Secondary | ICD-10-CM

## 2013-12-21 DIAGNOSIS — Z9359 Other cystostomy status: Secondary | ICD-10-CM

## 2013-12-21 DIAGNOSIS — N390 Urinary tract infection, site not specified: Secondary | ICD-10-CM | POA: Diagnosis present

## 2013-12-21 DIAGNOSIS — E669 Obesity, unspecified: Secondary | ICD-10-CM

## 2013-12-21 DIAGNOSIS — D509 Iron deficiency anemia, unspecified: Secondary | ICD-10-CM | POA: Diagnosis present

## 2013-12-21 DIAGNOSIS — A491 Streptococcal infection, unspecified site: Secondary | ICD-10-CM

## 2013-12-21 DIAGNOSIS — D638 Anemia in other chronic diseases classified elsewhere: Secondary | ICD-10-CM | POA: Diagnosis present

## 2013-12-21 DIAGNOSIS — I10 Essential (primary) hypertension: Secondary | ICD-10-CM | POA: Diagnosis present

## 2013-12-21 DIAGNOSIS — A409 Streptococcal sepsis, unspecified: Secondary | ICD-10-CM | POA: Diagnosis not present

## 2013-12-21 DIAGNOSIS — IMO0002 Reserved for concepts with insufficient information to code with codable children: Secondary | ICD-10-CM

## 2013-12-21 DIAGNOSIS — L89154 Pressure ulcer of sacral region, stage 4: Secondary | ICD-10-CM

## 2013-12-21 DIAGNOSIS — Z1624 Resistance to multiple antibiotics: Secondary | ICD-10-CM

## 2013-12-21 DIAGNOSIS — Z87440 Personal history of urinary (tract) infections: Secondary | ICD-10-CM

## 2013-12-21 DIAGNOSIS — R532 Functional quadriplegia: Secondary | ICD-10-CM

## 2013-12-21 DIAGNOSIS — M86659 Other chronic osteomyelitis, unspecified thigh: Secondary | ICD-10-CM | POA: Diagnosis present

## 2013-12-21 DIAGNOSIS — G8220 Paraplegia, unspecified: Secondary | ICD-10-CM

## 2013-12-21 DIAGNOSIS — E46 Unspecified protein-calorie malnutrition: Secondary | ICD-10-CM

## 2013-12-21 DIAGNOSIS — L89324 Pressure ulcer of left buttock, stage 4: Secondary | ICD-10-CM

## 2013-12-21 DIAGNOSIS — Z888 Allergy status to other drugs, medicaments and biological substances status: Secondary | ICD-10-CM

## 2013-12-21 DIAGNOSIS — Z933 Colostomy status: Secondary | ICD-10-CM

## 2013-12-21 DIAGNOSIS — I739 Peripheral vascular disease, unspecified: Secondary | ICD-10-CM

## 2013-12-21 DIAGNOSIS — L03317 Cellulitis of buttock: Secondary | ICD-10-CM

## 2013-12-21 DIAGNOSIS — L97909 Non-pressure chronic ulcer of unspecified part of unspecified lower leg with unspecified severity: Secondary | ICD-10-CM | POA: Diagnosis present

## 2013-12-21 DIAGNOSIS — M86159 Other acute osteomyelitis, unspecified femur: Secondary | ICD-10-CM | POA: Diagnosis present

## 2013-12-21 DIAGNOSIS — L8990 Pressure ulcer of unspecified site, unspecified stage: Secondary | ICD-10-CM

## 2013-12-21 DIAGNOSIS — A419 Sepsis, unspecified organism: Secondary | ICD-10-CM | POA: Diagnosis present

## 2013-12-21 DIAGNOSIS — E871 Hypo-osmolality and hyponatremia: Secondary | ICD-10-CM

## 2013-12-21 DIAGNOSIS — Z79899 Other long term (current) drug therapy: Secondary | ICD-10-CM

## 2013-12-21 DIAGNOSIS — L8994 Pressure ulcer of unspecified site, stage 4: Secondary | ICD-10-CM | POA: Diagnosis present

## 2013-12-21 DIAGNOSIS — L89899 Pressure ulcer of other site, unspecified stage: Secondary | ICD-10-CM | POA: Diagnosis present

## 2013-12-21 DIAGNOSIS — G8250 Quadriplegia, unspecified: Secondary | ICD-10-CM | POA: Diagnosis present

## 2013-12-21 DIAGNOSIS — E662 Morbid (severe) obesity with alveolar hypoventilation: Secondary | ICD-10-CM | POA: Diagnosis present

## 2013-12-21 DIAGNOSIS — T798XXA Other early complications of trauma, initial encounter: Secondary | ICD-10-CM

## 2013-12-21 DIAGNOSIS — D649 Anemia, unspecified: Secondary | ICD-10-CM | POA: Diagnosis present

## 2013-12-21 DIAGNOSIS — Z1635 Resistance to multiple antimicrobial drugs: Secondary | ICD-10-CM

## 2013-12-21 DIAGNOSIS — M1990 Unspecified osteoarthritis, unspecified site: Secondary | ICD-10-CM | POA: Diagnosis present

## 2013-12-21 DIAGNOSIS — J45909 Unspecified asthma, uncomplicated: Secondary | ICD-10-CM | POA: Diagnosis present

## 2013-12-21 DIAGNOSIS — L8992 Pressure ulcer of unspecified site, stage 2: Secondary | ICD-10-CM | POA: Diagnosis present

## 2013-12-21 DIAGNOSIS — L8995 Pressure ulcer of unspecified site, unstageable: Secondary | ICD-10-CM | POA: Diagnosis present

## 2013-12-21 DIAGNOSIS — L0231 Cutaneous abscess of buttock: Secondary | ICD-10-CM

## 2013-12-21 DIAGNOSIS — D72829 Elevated white blood cell count, unspecified: Secondary | ICD-10-CM

## 2013-12-21 DIAGNOSIS — L89109 Pressure ulcer of unspecified part of back, unspecified stage: Secondary | ICD-10-CM | POA: Diagnosis present

## 2013-12-21 DIAGNOSIS — E43 Unspecified severe protein-calorie malnutrition: Secondary | ICD-10-CM

## 2013-12-21 DIAGNOSIS — Z8701 Personal history of pneumonia (recurrent): Secondary | ICD-10-CM

## 2013-12-21 DIAGNOSIS — G4733 Obstructive sleep apnea (adult) (pediatric): Secondary | ICD-10-CM | POA: Diagnosis present

## 2013-12-21 DIAGNOSIS — E440 Moderate protein-calorie malnutrition: Secondary | ICD-10-CM | POA: Diagnosis present

## 2013-12-21 DIAGNOSIS — J189 Pneumonia, unspecified organism: Secondary | ICD-10-CM

## 2013-12-21 DIAGNOSIS — L089 Local infection of the skin and subcutaneous tissue, unspecified: Secondary | ICD-10-CM

## 2013-12-21 DIAGNOSIS — Z803 Family history of malignant neoplasm of breast: Secondary | ICD-10-CM

## 2013-12-21 DIAGNOSIS — Z9989 Dependence on other enabling machines and devices: Secondary | ICD-10-CM

## 2013-12-21 DIAGNOSIS — M869 Osteomyelitis, unspecified: Secondary | ICD-10-CM

## 2013-12-21 DIAGNOSIS — L8993 Pressure ulcer of unspecified site, stage 3: Secondary | ICD-10-CM | POA: Diagnosis present

## 2013-12-21 DIAGNOSIS — Z8711 Personal history of peptic ulcer disease: Secondary | ICD-10-CM

## 2013-12-21 DIAGNOSIS — L89103 Pressure ulcer of unspecified part of back, stage 3: Secondary | ICD-10-CM

## 2013-12-21 DIAGNOSIS — L89892 Pressure ulcer of other site, stage 2: Secondary | ICD-10-CM | POA: Diagnosis present

## 2013-12-21 DIAGNOSIS — L89303 Pressure ulcer of unspecified buttock, stage 3: Secondary | ICD-10-CM | POA: Diagnosis present

## 2013-12-21 DIAGNOSIS — J9610 Chronic respiratory failure, unspecified whether with hypoxia or hypercapnia: Secondary | ICD-10-CM | POA: Diagnosis present

## 2013-12-21 DIAGNOSIS — L89309 Pressure ulcer of unspecified buttock, unspecified stage: Secondary | ICD-10-CM | POA: Diagnosis present

## 2013-12-21 DIAGNOSIS — A499 Bacterial infection, unspecified: Secondary | ICD-10-CM

## 2013-12-21 DIAGNOSIS — L89153 Pressure ulcer of sacral region, stage 3: Secondary | ICD-10-CM | POA: Diagnosis present

## 2013-12-21 DIAGNOSIS — T148XXA Other injury of unspecified body region, initial encounter: Secondary | ICD-10-CM

## 2013-12-21 DIAGNOSIS — Z6830 Body mass index (BMI) 30.0-30.9, adult: Secondary | ICD-10-CM

## 2013-12-21 LAB — URINALYSIS, ROUTINE W REFLEX MICROSCOPIC
BILIRUBIN URINE: NEGATIVE
GLUCOSE, UA: NEGATIVE mg/dL
Ketones, ur: NEGATIVE mg/dL
Nitrite: POSITIVE — AB
PH: 6 (ref 5.0–8.0)
Protein, ur: NEGATIVE mg/dL
Specific Gravity, Urine: 1.015 (ref 1.005–1.030)
Urobilinogen, UA: 0.2 mg/dL (ref 0.0–1.0)

## 2013-12-21 LAB — URINE MICROSCOPIC-ADD ON

## 2013-12-21 LAB — I-STAT CG4 LACTIC ACID, ED: Lactic Acid, Venous: 0.63 mmol/L (ref 0.5–2.2)

## 2013-12-21 MED ORDER — PIPERACILLIN-TAZOBACTAM 3.375 G IVPB
3.3750 g | Freq: Once | INTRAVENOUS | Status: AC
  Administered 2013-12-22: 3.375 g via INTRAVENOUS
  Filled 2013-12-21: qty 50

## 2013-12-21 MED ORDER — VANCOMYCIN HCL IN DEXTROSE 1-5 GM/200ML-% IV SOLN: 1000.0000 mg | Freq: Once | INTRAVENOUS | Status: DC

## 2013-12-21 MED ORDER — SODIUM CHLORIDE 0.9 % IV SOLN
INTRAVENOUS | Status: DC
  Administered 2013-12-22: via INTRAVENOUS

## 2013-12-21 NOTE — ED Notes (Signed)
Bed: BX43 Expected date: 12/21/13 Expected time: 8:11 PM Means of arrival: Ambulance Comments: Bed 5, EMS, 72 M, UTI

## 2013-12-21 NOTE — ED Notes (Signed)
Pt caregiver states pt could possibly have UTI. Caregiver reports pt urine has smelled strong lately and urine is leaking out around catheter. Pt has stage 3 ulcers on sacrum and is unsure if that is what he is smelling and not the urine. Pt has indwelling catheter. Pt is quadriplegic and denies pain at this time. Pt reports being more thirsty than usual.
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
## 2013-12-21 NOTE — ED Notes (Signed)
Pt difficult stick, IV team contacted for pt IV start and lab draw. MD aware.

## 2013-12-21 NOTE — ED Notes (Signed)
Phlebotomy contacted to draw pt labs.

## 2013-12-22 ENCOUNTER — Inpatient Hospital Stay (HOSPITAL_COMMUNITY): Payer: Medicare Other

## 2013-12-22 ENCOUNTER — Encounter (HOSPITAL_COMMUNITY): Payer: Self-pay | Admitting: Internal Medicine

## 2013-12-22 DIAGNOSIS — Z933 Colostomy status: Secondary | ICD-10-CM | POA: Diagnosis not present

## 2013-12-22 DIAGNOSIS — G4733 Obstructive sleep apnea (adult) (pediatric): Secondary | ICD-10-CM

## 2013-12-22 DIAGNOSIS — N390 Urinary tract infection, site not specified: Secondary | ICD-10-CM | POA: Diagnosis present

## 2013-12-22 DIAGNOSIS — Z888 Allergy status to other drugs, medicaments and biological substances status: Secondary | ICD-10-CM | POA: Diagnosis not present

## 2013-12-22 DIAGNOSIS — L8992 Pressure ulcer of unspecified site, stage 2: Secondary | ICD-10-CM

## 2013-12-22 DIAGNOSIS — A409 Streptococcal sepsis, unspecified: Secondary | ICD-10-CM | POA: Diagnosis present

## 2013-12-22 DIAGNOSIS — L89103 Pressure ulcer of unspecified part of back, stage 3: Secondary | ICD-10-CM

## 2013-12-22 DIAGNOSIS — M86659 Other chronic osteomyelitis, unspecified thigh: Secondary | ICD-10-CM | POA: Diagnosis present

## 2013-12-22 DIAGNOSIS — D638 Anemia in other chronic diseases classified elsewhere: Secondary | ICD-10-CM | POA: Diagnosis present

## 2013-12-22 DIAGNOSIS — A419 Sepsis, unspecified organism: Secondary | ICD-10-CM | POA: Diagnosis present

## 2013-12-22 DIAGNOSIS — R6510 Systemic inflammatory response syndrome (SIRS) of non-infectious origin without acute organ dysfunction: Secondary | ICD-10-CM

## 2013-12-22 DIAGNOSIS — D509 Iron deficiency anemia, unspecified: Secondary | ICD-10-CM | POA: Diagnosis present

## 2013-12-22 DIAGNOSIS — E662 Morbid (severe) obesity with alveolar hypoventilation: Secondary | ICD-10-CM | POA: Diagnosis present

## 2013-12-22 DIAGNOSIS — I10 Essential (primary) hypertension: Secondary | ICD-10-CM | POA: Diagnosis present

## 2013-12-22 DIAGNOSIS — G8250 Quadriplegia, unspecified: Secondary | ICD-10-CM | POA: Diagnosis present

## 2013-12-22 DIAGNOSIS — G40909 Epilepsy, unspecified, not intractable, without status epilepticus: Secondary | ICD-10-CM

## 2013-12-22 DIAGNOSIS — M86159 Other acute osteomyelitis, unspecified femur: Secondary | ICD-10-CM | POA: Diagnosis present

## 2013-12-22 DIAGNOSIS — L089 Local infection of the skin and subcutaneous tissue, unspecified: Secondary | ICD-10-CM

## 2013-12-22 DIAGNOSIS — L89153 Pressure ulcer of sacral region, stage 3: Secondary | ICD-10-CM | POA: Diagnosis present

## 2013-12-22 DIAGNOSIS — L89899 Pressure ulcer of other site, unspecified stage: Secondary | ICD-10-CM | POA: Diagnosis present

## 2013-12-22 DIAGNOSIS — Z803 Family history of malignant neoplasm of breast: Secondary | ICD-10-CM | POA: Diagnosis not present

## 2013-12-22 DIAGNOSIS — L89309 Pressure ulcer of unspecified buttock, unspecified stage: Secondary | ICD-10-CM

## 2013-12-22 DIAGNOSIS — Z79899 Other long term (current) drug therapy: Secondary | ICD-10-CM | POA: Diagnosis not present

## 2013-12-22 DIAGNOSIS — Z6830 Body mass index (BMI) 30.0-30.9, adult: Secondary | ICD-10-CM | POA: Diagnosis not present

## 2013-12-22 DIAGNOSIS — Z8711 Personal history of peptic ulcer disease: Secondary | ICD-10-CM | POA: Diagnosis not present

## 2013-12-22 DIAGNOSIS — L97909 Non-pressure chronic ulcer of unspecified part of unspecified lower leg with unspecified severity: Secondary | ICD-10-CM | POA: Diagnosis present

## 2013-12-22 DIAGNOSIS — Z8701 Personal history of pneumonia (recurrent): Secondary | ICD-10-CM | POA: Diagnosis not present

## 2013-12-22 DIAGNOSIS — A499 Bacterial infection, unspecified: Secondary | ICD-10-CM

## 2013-12-22 DIAGNOSIS — L8993 Pressure ulcer of unspecified site, stage 3: Secondary | ICD-10-CM | POA: Diagnosis present

## 2013-12-22 DIAGNOSIS — IMO0002 Reserved for concepts with insufficient information to code with codable children: Secondary | ICD-10-CM | POA: Diagnosis present

## 2013-12-22 DIAGNOSIS — J9610 Chronic respiratory failure, unspecified whether with hypoxia or hypercapnia: Secondary | ICD-10-CM | POA: Diagnosis present

## 2013-12-22 DIAGNOSIS — J45909 Unspecified asthma, uncomplicated: Secondary | ICD-10-CM | POA: Diagnosis present

## 2013-12-22 DIAGNOSIS — M869 Osteomyelitis, unspecified: Secondary | ICD-10-CM

## 2013-12-22 DIAGNOSIS — L89109 Pressure ulcer of unspecified part of back, unspecified stage: Secondary | ICD-10-CM | POA: Diagnosis present

## 2013-12-22 DIAGNOSIS — Z87440 Personal history of urinary (tract) infections: Secondary | ICD-10-CM | POA: Diagnosis not present

## 2013-12-22 DIAGNOSIS — L89303 Pressure ulcer of unspecified buttock, stage 3: Secondary | ICD-10-CM | POA: Diagnosis present

## 2013-12-22 DIAGNOSIS — L8994 Pressure ulcer of unspecified site, stage 4: Secondary | ICD-10-CM | POA: Diagnosis present

## 2013-12-22 DIAGNOSIS — D72829 Elevated white blood cell count, unspecified: Secondary | ICD-10-CM

## 2013-12-22 DIAGNOSIS — T148XXA Other injury of unspecified body region, initial encounter: Secondary | ICD-10-CM

## 2013-12-22 DIAGNOSIS — M1990 Unspecified osteoarthritis, unspecified site: Secondary | ICD-10-CM | POA: Diagnosis present

## 2013-12-22 DIAGNOSIS — L89892 Pressure ulcer of other site, stage 2: Secondary | ICD-10-CM | POA: Diagnosis present

## 2013-12-22 DIAGNOSIS — L8990 Pressure ulcer of unspecified site, unspecified stage: Secondary | ICD-10-CM

## 2013-12-22 DIAGNOSIS — B9689 Other specified bacterial agents as the cause of diseases classified elsewhere: Secondary | ICD-10-CM

## 2013-12-22 DIAGNOSIS — E440 Moderate protein-calorie malnutrition: Secondary | ICD-10-CM | POA: Diagnosis present

## 2013-12-22 DIAGNOSIS — L8995 Pressure ulcer of unspecified site, unstageable: Secondary | ICD-10-CM | POA: Diagnosis present

## 2013-12-22 LAB — BASIC METABOLIC PANEL
Anion gap: 13 (ref 5–15)
Anion gap: 13 (ref 5–15)
BUN: 20 mg/dL (ref 6–23)
BUN: 24 mg/dL — ABNORMAL HIGH (ref 6–23)
CALCIUM: 9.5 mg/dL (ref 8.4–10.5)
CO2: 23 mEq/L (ref 19–32)
CO2: 24 mEq/L (ref 19–32)
Calcium: 9.1 mg/dL (ref 8.4–10.5)
Chloride: 101 mEq/L (ref 96–112)
Chloride: 102 mEq/L (ref 96–112)
Creatinine, Ser: 0.44 mg/dL — ABNORMAL LOW (ref 0.50–1.35)
Creatinine, Ser: 0.45 mg/dL — ABNORMAL LOW (ref 0.50–1.35)
GFR calc Af Amer: >90 mL/min (ref >90)
GLUCOSE: 93 mg/dL (ref 70–99)
Glucose, Bld: 114 mg/dL — ABNORMAL HIGH (ref 70–99)
POTASSIUM: 3.9 meq/L (ref 3.7–5.3)
Potassium: 5.4 mEq/L — ABNORMAL HIGH (ref 3.7–5.3)
Sodium: 138 mEq/L (ref 137–147)
Sodium: 138 mEq/L (ref 137–147)

## 2013-12-22 LAB — CBC WITH DIFFERENTIAL/PLATELET
BASOS ABS: 0 10*3/uL (ref 0.0–0.1)
Basophils Relative: 0 % (ref 0–1)
Eosinophils Absolute: 0.3 10*3/uL (ref 0.0–0.7)
Eosinophils Relative: 2 % (ref 0–5)
HCT: 27.7 % — ABNORMAL LOW (ref 39.0–52.0)
Hemoglobin: 8.4 g/dL — ABNORMAL LOW (ref 13.0–17.0)
LYMPHS PCT: 14 % (ref 12–46)
Lymphs Abs: 1.8 10*3/uL (ref 0.7–4.0)
MCH: 21.9 pg — AB (ref 26.0–34.0)
MCHC: 30.3 g/dL (ref 30.0–36.0)
MCV: 72.3 fL — ABNORMAL LOW (ref 78.0–100.0)
MONO ABS: 1.5 10*3/uL — AB (ref 0.1–1.0)
Monocytes Relative: 12 % (ref 3–12)
Neutro Abs: 9 10*3/uL — ABNORMAL HIGH (ref 1.7–7.7)
Neutrophils Relative %: 72 % (ref 43–77)
PLATELETS: 502 10*3/uL — AB (ref 150–400)
RBC: 3.83 MIL/uL — ABNORMAL LOW (ref 4.22–5.81)
RDW: 18 % — ABNORMAL HIGH (ref 11.5–15.5)
WBC: 12.6 10*3/uL — ABNORMAL HIGH (ref 4.0–10.5)

## 2013-12-22 LAB — CBC
HEMATOCRIT: 26.9 % — AB (ref 39.0–52.0)
HEMOGLOBIN: 8.2 g/dL — AB (ref 13.0–17.0)
MCH: 21.9 pg — AB (ref 26.0–34.0)
MCHC: 30.5 g/dL (ref 30.0–36.0)
MCV: 71.9 fL — ABNORMAL LOW (ref 78.0–100.0)
PLATELETS: 409 10*3/uL — AB (ref 150–400)
RBC: 3.74 MIL/uL — ABNORMAL LOW (ref 4.22–5.81)
RDW: 18.1 % — ABNORMAL HIGH (ref 11.5–15.5)
WBC: 12.3 10*3/uL — ABNORMAL HIGH (ref 4.0–10.5)

## 2013-12-22 MED ORDER — BOOST HIGH PROTEIN PO LIQD
1.0000 | Freq: Three times a day (TID) | ORAL | Status: DC
  Filled 2013-12-22: qty 1

## 2013-12-22 MED ORDER — SODIUM CHLORIDE 0.9 % IV BOLUS (SEPSIS): 250.0000 mL | INTRAVENOUS | Status: DC | PRN

## 2013-12-22 MED ORDER — ONDANSETRON HCL 4 MG/2ML IJ SOLN
4.0000 mg | Freq: Four times a day (QID) | INTRAMUSCULAR | Status: DC | PRN
  Administered 2013-12-22 – 2014-01-03 (×2): 4 mg via INTRAVENOUS
  Filled 2013-12-22 (×2): qty 2

## 2013-12-22 MED ORDER — ADULT MULTIVITAMIN W/MINERALS CH
1.0000 | ORAL_TABLET | Freq: Every morning | ORAL | Status: DC
  Administered 2013-12-22 – 2014-01-06 (×16): 1 via ORAL
  Filled 2013-12-22 (×17): qty 1

## 2013-12-22 MED ORDER — ENSURE COMPLETE PO LIQD
237.0000 mL | Freq: Three times a day (TID) | ORAL | Status: DC
  Administered 2013-12-22 – 2013-12-23 (×4): 237 mL via ORAL

## 2013-12-22 MED ORDER — ZINC SULFATE 220 (50 ZN) MG PO CAPS
220.0000 mg | ORAL_CAPSULE | Freq: Every morning | ORAL | Status: DC
  Administered 2013-12-22 – 2014-01-06 (×16): 220 mg via ORAL
  Filled 2013-12-22 (×17): qty 1

## 2013-12-22 MED ORDER — FAMOTIDINE 20 MG PO TABS
20.0000 mg | ORAL_TABLET | Freq: Two times a day (BID) | ORAL | Status: DC
  Administered 2013-12-22 – 2014-01-06 (×31): 20 mg via ORAL
  Filled 2013-12-22 (×35): qty 1

## 2013-12-22 MED ORDER — DOCUSATE SODIUM 100 MG PO CAPS
100.0000 mg | ORAL_CAPSULE | Freq: Two times a day (BID) | ORAL | Status: DC
  Administered 2013-12-22 – 2014-01-06 (×31): 100 mg via ORAL
  Filled 2013-12-22 (×33): qty 1

## 2013-12-22 MED ORDER — PIPERACILLIN-TAZOBACTAM 3.375 G IVPB 30 MIN
3.3750 g | Freq: Three times a day (TID) | INTRAVENOUS | Status: DC
  Administered 2013-12-22 – 2013-12-25 (×11): 3.375 g via INTRAVENOUS
  Filled 2013-12-22 (×11): qty 50

## 2013-12-22 MED ORDER — VANCOMYCIN HCL 10 G IV SOLR
1250.0000 mg | Freq: Two times a day (BID) | INTRAVENOUS | Status: DC
  Filled 2013-12-22: qty 1250

## 2013-12-22 MED ORDER — ACETAMINOPHEN 650 MG RE SUPP: 650.0000 mg | Freq: Four times a day (QID) | RECTAL | Status: DC | PRN

## 2013-12-22 MED ORDER — VANCOMYCIN HCL 10 G IV SOLR
2500.0000 mg | Freq: Once | INTRAVENOUS | Status: AC
  Administered 2013-12-22: 2500 mg via INTRAVENOUS
  Filled 2013-12-22: qty 2500

## 2013-12-22 MED ORDER — ALUM & MAG HYDROXIDE-SIMETH 200-200-20 MG/5ML PO SUSP: 30.0000 mL | Freq: Four times a day (QID) | ORAL | Status: DC | PRN

## 2013-12-22 MED ORDER — HYDROMORPHONE HCL PF 1 MG/ML IJ SOLN: 0.5000 mg | INTRAMUSCULAR | Status: DC | PRN

## 2013-12-22 MED ORDER — ONDANSETRON HCL 4 MG PO TABS: 4.0000 mg | ORAL_TABLET | Freq: Four times a day (QID) | ORAL | Status: DC | PRN

## 2013-12-22 MED ORDER — POTASSIUM CHLORIDE CRYS ER 20 MEQ PO TBCR
20.0000 meq | EXTENDED_RELEASE_TABLET | Freq: Two times a day (BID) | ORAL | Status: DC
  Administered 2013-12-23 – 2014-01-06 (×29): 20 meq via ORAL
  Filled 2013-12-22 (×32): qty 1

## 2013-12-22 MED ORDER — METOCLOPRAMIDE HCL 10 MG PO TABS
10.0000 mg | ORAL_TABLET | Freq: Three times a day (TID) | ORAL | Status: DC
  Administered 2013-12-22 – 2014-01-06 (×44): 10 mg via ORAL
  Filled 2013-12-22 (×52): qty 1

## 2013-12-22 MED ORDER — AMLODIPINE BESYLATE 2.5 MG PO TABS
2.5000 mg | ORAL_TABLET | Freq: Every morning | ORAL | Status: DC
  Administered 2013-12-22 – 2014-01-06 (×15): 2.5 mg via ORAL
  Filled 2013-12-22 (×17): qty 1

## 2013-12-22 MED ORDER — SODIUM CHLORIDE 0.9 % IV BOLUS (SEPSIS): 250.0000 mL | Freq: Once | INTRAVENOUS | Status: DC

## 2013-12-22 MED ORDER — OXYCODONE HCL 5 MG PO TABS: 5.0000 mg | ORAL_TABLET | ORAL | Status: DC | PRN

## 2013-12-22 MED ORDER — SODIUM CHLORIDE 0.9 % IV SOLN
INTRAVENOUS | Status: DC
  Administered 2013-12-22 – 2014-01-01 (×7): via INTRAVENOUS

## 2013-12-22 MED ORDER — POTASSIUM CHLORIDE CRYS ER 20 MEQ PO TBCR
20.0000 meq | EXTENDED_RELEASE_TABLET | Freq: Two times a day (BID) | ORAL | Status: DC
  Filled 2013-12-22 (×2): qty 1

## 2013-12-22 MED ORDER — ENOXAPARIN SODIUM 60 MG/0.6ML ~~LOC~~ SOLN
60.0000 mg | Freq: Every day | SUBCUTANEOUS | Status: DC
  Administered 2013-12-22 – 2013-12-24 (×3): 60 mg via SUBCUTANEOUS
  Filled 2013-12-22 (×4): qty 0.6

## 2013-12-22 MED ORDER — VITAMIN C 500 MG PO TABS
500.0000 mg | ORAL_TABLET | Freq: Every morning | ORAL | Status: DC
  Administered 2013-12-22 – 2014-01-06 (×16): 500 mg via ORAL
  Filled 2013-12-22 (×17): qty 1

## 2013-12-22 MED ORDER — ACETAMINOPHEN 325 MG PO TABS
650.0000 mg | ORAL_TABLET | Freq: Four times a day (QID) | ORAL | Status: DC | PRN
  Administered 2013-12-22 – 2013-12-24 (×2): 650 mg via ORAL
  Filled 2013-12-22 (×2): qty 2

## 2013-12-22 MED ORDER — COLLAGENASE 250 UNIT/GM EX OINT
TOPICAL_OINTMENT | Freq: Every day | CUTANEOUS | Status: DC
  Administered 2013-12-23 – 2014-01-06 (×14): via TOPICAL
  Filled 2013-12-22 (×3): qty 30

## 2013-12-22 MED ORDER — PIPERACILLIN-TAZOBACTAM 3.375 G IVPB 30 MIN
3.3750 g | Freq: Three times a day (TID) | INTRAVENOUS | Status: DC
  Filled 2013-12-22: qty 50

## 2013-12-22 MED ORDER — ENOXAPARIN SODIUM 30 MG/0.3ML ~~LOC~~ SOLN: 30.0000 mg | SUBCUTANEOUS | Status: DC

## 2013-12-22 MED ORDER — SODIUM CHLORIDE 0.9 % IV SOLN
INTRAVENOUS | Status: DC
Start: 2013-12-22 — End: 2013-12-22
  Administered 2013-12-22: 03:00:00 via INTRAVENOUS

## 2013-12-22 MED ORDER — BACLOFEN 20 MG PO TABS
20.0000 mg | ORAL_TABLET | Freq: Four times a day (QID) | ORAL | Status: DC
  Administered 2013-12-22 – 2014-01-06 (×60): 20 mg via ORAL
  Filled 2013-12-22 (×65): qty 1
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
## 2013-12-22 NOTE — Progress Notes (Signed)
Placed pt on CPAP of 19 cmH2O with large mask. Pt tolerating well at this time. Instructed to call respiratory if any problems arise.

## 2013-12-22 NOTE — Progress Notes (Signed)
Md notified of rise in temp to 102.1 one hour after tylenol administration for a temp of 101.4.  MD also made aware that we are unable to get any blood for blood cultures due to poor venous access.  MD informed RN that critical care MD to see pt today for central line placement and RN to draw one set of cultures from central line once its in place. RN to continue to monitor pt and complete tasks as access becomes available.

## 2013-12-22 NOTE — Progress Notes (Signed)
Rx Brief Lovenox note Pt wt=125kg, CrCl~>100 BMI=37.3 Rx adjusted Lovenox to 60mg  daily in pt with BMI>30  Dorrene German 12/22/2013 2:46 AM

## 2013-12-22 NOTE — Progress Notes (Signed)
10:41 AM I agree with HPI/GPe and A/P per Dr. Arnoldo Morale     48 y/o ? 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  Has not been on IV abx for a while but has been on suppressive bactrim DS.   Was seen by plastic surgeon last week and had wounds debrided Wants to have wounds examined 1-time by [facility] nurse. Requesting reg diet Otherwise looking well  HEENT obese pleasant oriented in NAd CHEST clear CARDIAC s1 s 2 no m/r/g ABDOMEN soft, Colostomy area clean, Urostomy intact Did not assess wounds  Patient Active Problem List   Diagnosis Date Noted  . Wound infection 12/22/2013  . Decubitus ulcer of ischium, stage 3 12/22/2013  . Decubitus ulcer of sacral region, stage 3 12/22/2013  . Decubitus ulcer of back, stage 3 12/22/2013  . Decubitus ulcer of lower extremity, stage 2 12/22/2013  . UTI (lower urinary tract infection) 06/21/2013  . Severe protein-calorie malnutrition 03/25/2013  . Sepsis 11/19/2012  . Anemia 08/07/2012  . Protein calorie malnutrition 08/07/2012  . OSA on CPAP 07/11/2012  . Complicated UTI (urinary tract infection) 05/04/2012  . Sacral decubitus ulcer, stage IV 04/22/2012  . Microcytic anemia 04/22/2012  . S/P colostomy 04/22/2012  . Suprapubic catheter 04/22/2012  . Hyponatremia 01/17/2012  . Leukocytosis 01/17/2012  . Seizure disorder   . HTN (hypertension)   . Quadriplegia 07/23/2011  . Obesity 07/19/2011  . Infected decubitus ulcer 07/19/2011  . Recurrent pneumonia 12/24/2010  . OSA (obstructive sleep apnea) 12/24/2010  . PVD 03/11/2010    Consult ID for assistance Alerted Dr. [name] to patient being here Spoke with Dr. Tommy Medal who will see today-rec's holding Vancomycin for now if thought is Pyelo.  [name], MD [facility] [phone]

## 2013-12-22 NOTE — Procedures (Signed)
Central Venous Catheter Insertion Procedure Note Michio Thier [id number] February 08, 1966  Procedure: Insertion of Central Venous Catheter Indications: Assessment of intravascular volume, Drug and/or fluid administration and Frequent blood sampling  Procedure Details Consent: Risks of procedure as well as the alternatives and risks of each were explained to the (patient/caregiver).  Consent for procedure obtained. Time Out: Verified patient identification, verified procedure, site/side was marked, verified correct patient position, special equipment/implants available, medications/allergies/relevent history reviewed, required imaging and test results available.  Performed  Maximum sterile technique was used including antiseptics, cap, gloves, gown, hand hygiene, mask and sheet. Skin prep: Chlorhexidine; local anesthetic administered A antimicrobial bonded/coated triple lumen catheter was placed in the left internal jugular vein using the Seldinger technique. Ultrasound guidance used.Yes.   Catheter placed to 20 cm. Blood aspirated via all 3 ports and then flushed x 3. Line sutured x 2 and dressing applied.  Evaluation Blood flow good Complications: No apparent complications Patient did tolerate procedure well. Chest X-ray ordered to verify placement.  CXR: pending.  Richardson Landry Minor ACNP Maryanna Shape PCCM Pager [phone] till 3 pm If no answer page [phone] 12/22/2013, 4:22 PM  Chesley Mires, MD [place] 12/22/2013, 5:27 PM Pager:  [phone] After 3pm call: [phone]
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
## 2013-12-22 NOTE — Consult Note (Addendum)
WOC ostomy consult note Stoma type/location: LLQ Diverting Colostomy Stomal assessment/size: 1 and 1/4 inches, visualized through pouch.  Patient is independent in ostomy care.  Supplies ordered to bedside (see below) Peristomal assessment: Not seen today.  Patient reports that he is having no ostomy problems. Treatment options for stomal/peristomal skin: None indicated. Output soft brown stool Ostomy pouching: 2pc., 2 and 3/4 inch ostomy pouching system, Pouch Kellie Simmering #649, Skin barrier Kellie Simmering #2  Education provided: None needed.  Patient is aware that the pouches here in house do not have a gas filter and he is fine with that. Patient states that he believes this admission is due to a urinary tract infection.  WOC wound consult note Reason for Consult: Patient is well known to our department from multiple previous admissions over the years.  Last seen by this writer in February of this year and by my partners more recently than that.  He is followed by the outpatient wound care center and sees Plastic Surgeon [name], DO for his wound care. If you have questions or believe she should see while in house, please consult either Dr. [name] or her partner.  Patient was recently debrided (according to caregiver) and all wounds continue to improve, albeit quite slowly. Last note from Dr. [name] in [facility] : [facility] is on 07/09/13, but outpatient wound care center does not use this version of EMR.  WTA Nancy Marus dressed all wounds today and measured.  I will see wounds with her tomorrow am and will validate the wound measurements while assisting her with wound care.  Santyl ointment and wound/ostomy orders written today.  Patient was placed on a replacement mattress with low air loss feature this afternoon. Prevalon boots ordered. Wound type:Pressure Pressure Ulcer POA: Yes Measurements:Sacral Stage IV PrU: 10.5cm x 15cn x 2cm with undermining measuring 2cm from 12-6 o'clock and from 9-12 o'clock.  The wound bed  presents with 50% yellow slough, 50% pale pink base.  Perineal area Stage III PrU:  14cm x 6cm x 2cm with 1cm undermining from 7-10 o'clock.  Wound bed is partially obscured by yellow slough (50%), the remainder of wound bed is pale pink. Right Scapular Pressure Ulcer, Unstageable:  3cm x 4.5cm.  Wound bed is 100% obscured by yellow slough.  Right LE pretibial area distal, Stage III:  1cm x 1cm x 0.2cm,  100% pale pink wound base.  Right LE pretibial area proximal, Stage III: 1cm x 1cm x 0.2cm, 100% pale pink wound base. Left ischial tuberosity Stage IV PrU:  14cm x 5.5cm x 2.5cm with 3cm deep area noted between 7-8 o'clock.  This is filled with iodoform gauze packing strip as that is what was previously being done as ordered by the outpatient wound care center. The wound bed is 95% pale pink with isolated islands of deeper red tissue (5%).  Right ischial tuberosity Stage IV PrU:  14.5cm x 10cm x 7.5cm with pale pink wound bed. Wound bed:As noted above. Drainage (amount, consistency, odor) As noted above Periwound: Generally intact with evidence of previous wound healing (scars) Dressing procedure/placement/frequency: Wounds are supported with moisture retentive saline moistened gauze dressings.  Three wounds will have enzymatic debridement via collagenase (santyl) ie., the right scapula, the perineal wound and the sacral pressure ulcer, all will be topped with saline gauze dressings, topped with ABD pads and secured with breathable cloth tape. Patient is provided with a low air loss therapeutic sleep surface to redistribute pressure to the trunk and bilateral Prevalon Boots to provide  pressure redistribution to the heels and feet.  [facility] nursing team will not follow routinely, but will see tomorrow for ostomy pouch change and wound dressings.  We will remain available to this patient, the nursing and medical teams, however.  Please re-consult if needed. Thanks, Maudie Flakes, MSN, RN, [place], [place], [place]  [phone])

## 2013-12-22 NOTE — Progress Notes (Signed)
E link MD paged regarding stat chest xray results, per MD CVC line ok to use.

## 2013-12-23 LAB — BASIC METABOLIC PANEL
ANION GAP: 12 (ref 5–15)
BUN: 13 mg/dL (ref 6–23)
CO2: 25 meq/L (ref 19–32)
Calcium: 8.6 mg/dL (ref 8.4–10.5)
Chloride: 100 mEq/L (ref 96–112)
Creatinine, Ser: 0.54 mg/dL (ref 0.50–1.35)
GFR calc Af Amer: >90 mL/min (ref >90)
Glucose, Bld: 105 mg/dL — ABNORMAL HIGH (ref 70–99)
POTASSIUM: 3.9 meq/L (ref 3.7–5.3)
SODIUM: 137 meq/L (ref 137–147)

## 2013-12-23 LAB — CBC WITH DIFFERENTIAL/PLATELET
Basophils Absolute: 0 10*3/uL (ref 0.0–0.1)
Basophils Relative: 0 % (ref 0–1)
EOS PCT: 4 % (ref 0–5)
Eosinophils Absolute: 0.5 10*3/uL (ref 0.0–0.7)
HCT: 23.3 % — ABNORMAL LOW (ref 39.0–52.0)
HEMOGLOBIN: 7.1 g/dL — AB (ref 13.0–17.0)
LYMPHS PCT: 10 % — AB (ref 12–46)
Lymphs Abs: 1.2 10*3/uL (ref 0.7–4.0)
MCH: 22.1 pg — ABNORMAL LOW (ref 26.0–34.0)
MCHC: 30.5 g/dL (ref 30.0–36.0)
MCV: 72.6 fL — ABNORMAL LOW (ref 78.0–100.0)
Monocytes Absolute: 1.7 10*3/uL — ABNORMAL HIGH (ref 0.1–1.0)
Monocytes Relative: 14 % — ABNORMAL HIGH (ref 3–12)
NEUTROS PCT: 72 % (ref 43–77)
Neutro Abs: 8.4 10*3/uL — ABNORMAL HIGH (ref 1.7–7.7)
PLATELETS: 379 10*3/uL (ref 150–400)
RBC: 3.21 MIL/uL — AB (ref 4.22–5.81)
RDW: 18 % — ABNORMAL HIGH (ref 11.5–15.5)
WBC: 11.8 10*3/uL — AB (ref 4.0–10.5)

## 2013-12-23 MED ORDER — PRO-STAT SUGAR FREE PO LIQD
30.0000 mL | Freq: Three times a day (TID) | ORAL | Status: DC
  Administered 2013-12-23 – 2013-12-29 (×17): 30 mL via ORAL
  Filled 2013-12-23 (×24): qty 30

## 2013-12-23 MED ORDER — ENSURE COMPLETE PO LIQD
237.0000 mL | Freq: Two times a day (BID) | ORAL | Status: DC
  Administered 2013-12-23 – 2014-01-06 (×16): 237 mL via ORAL
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
## 2013-12-23 NOTE — Progress Notes (Signed)
[name] [name] for Infectious Disease  #3 Zosyn he also received a dose of vancomycin  Subjective: [name]   Antibiotics:  Anti-infectives   Start     Dose/Rate Route Frequency Ordered Stop   12/22/13 1400  vancomycin (VANCOCIN) 1,250 mg in sodium chloride 0.9 % 250 mL IVPB  Status:  Discontinued     1,250 mg 166.7 mL/hr over 90 Minutes Intravenous Every 12 hours 12/22/13 0201 12/22/13 1132   12/22/13 0800  piperacillin-tazobactam (ZOSYN) IVPB 3.375 g     3.375 g 12.5 mL/hr over 240 Minutes Intravenous Every 8 hours 12/22/13 0146     12/22/13 0600  piperacillin-tazobactam (ZOSYN) IVPB 3.375 g  Status:  Discontinued     3.375 g 100 mL/hr over 30 Minutes Intravenous 3 times per day 12/22/13 0129 12/22/13 0146   12/22/13 0145  vancomycin (VANCOCIN) 2,500 mg in sodium chloride 0.9 % 500 mL IVPB     2,500 mg 250 mL/hr over 120 Minutes Intravenous  Once 12/22/13 0135 12/22/13 0428   12/21/13 2230  piperacillin-tazobactam (ZOSYN) IVPB 3.375 g     3.375 g 100 mL/hr over 30 Minutes Intravenous  Once 12/21/13 2216 12/22/13 1216   12/21/13 2215  vancomycin (VANCOCIN) IVPB 1000 mg/200 mL premix  Status:  Discontinued     1,000 mg 200 mL/hr over 60 Minutes Intravenous  Once 12/21/13 2211 12/22/13 0133      Medications: Scheduled Meds: . amLODipine  2.5 mg Oral q morning - 10a  . baclofen  20 mg Oral QID  . collagenase   Topical Daily  . docusate sodium  100 mg Oral BID  . enoxaparin (LOVENOX) injection  60 mg Subcutaneous Daily  . famotidine  20 mg Oral BID  . feeding supplement (ENSURE COMPLETE)  237 mL Oral BID BM  . feeding supplement (PRO-STAT SUGAR FREE 64)  30 mL Oral TID WC  . metoCLOPramide  10 mg Oral TID AC  . multivitamin with minerals  1 tablet Oral q morning - 10a  . piperacillin-tazobactam  3.375 g Intravenous Q8H  . potassium chloride SA  20 mEq Oral BID  . sodium chloride  250 mL Intravenous Once  . vitamin C  500 mg Oral q morning - 10a  . zinc  sulfate  220 mg Oral q morning - 10a   Continuous Infusions: . sodium chloride 125 mL/hr at 12/22/13 0024  . sodium chloride 75 mL/hr at 12/22/13 1825   PRN Meds:.acetaminophen, acetaminophen, alum & mag hydroxide-simeth, HYDROmorphone (DILAUDID) injection, ondansetron (ZOFRAN) IV, ondansetron, oxyCODONE, sodium chloride    Objective: Weight change:   Intake/Output Summary (Last 24 hours) at 12/23/13 1639 Last data filed at 12/23/13 1500  Gross per 24 hour  Intake   2030 ml  Output   2650 ml  Net   -620 ml   Blood pressure 101/57, pulse 88, temperature 98.7 F (37.1 C), temperature source Oral, resp. rate 16, height 6' (1.829 m), weight 226 lb (102.513 kg), SpO2 95.00%. Temp:  [98.5 F (36.9 C)-99.9 F (37.7 C)] 98.7 F (37.1 C) (08/10 1355) Pulse Rate:  [88-104] 88 (08/10 1355) Resp:  [16-18] 16 (08/10 1355) BP: (95-104)/(51-64) 101/57 mmHg (08/10 1355) SpO2:  [95 %-98 %] 95 % (08/10 1355)  Physical Exam: General: Alert and awake, oriented x3, not in any acute distress. HEENT: anicteric sclera, EOMI CVS regular rate, normal  Chest: No wheezing no respiratory distress Abdomen: soft nontender, nondistended, normal bowel sounds, Skin: See pictures below regarding various ulcerations  Right side lateral wound deep stage IV with tunnelling    Left side:tunnel   Ulcer entered the patient's scrotum    Not well-visualized in this picture but at the bottom of the picture is that of deep fistula that is packed with "shoe string " packing     Upper back      Neuro: Quadriplegic  CBC:  Recent Labs Lab 12/21/13 2335 12/22/13 0843 12/23/13 0805  HGB 8.4* 8.2* 7.1*  HCT 27.7* 26.9* 23.3*  PLT 502* 409* 379     BMET  Recent Labs  12/22/13 0730 12/23/13 0805  NA 138 137  K 5.4* 3.9  CL 102 100  CO2 23 25  GLUCOSE 93 105*  BUN 20 13  CREATININE 0.44* 0.54  CALCIUM 9.1 8.6     Liver Panel  No results found for this basename: PROT, ALBUMIN,  AST, ALT, ALKPHOS, BILITOT, BILIDIR, IBILI,  in the last 72 hours     Sedimentation Rate No results found for this basename: ESRSEDRATE,  in the last 72 hours C-Reactive Protein No results found for this basename: CRP,  in the last 72 hours  Micro Results: Recent Results (from the past 720 hour(s))  CULTURE, BLOOD (ROUTINE X 2)     Status: None   Collection Time    12/21/13 11:35 PM      Result Value Ref Range Status   Specimen Description BLOOD LEFT HAND   Final   Special Requests BOTTLES DRAWN AEROBIC ONLY 5CC   Final   Culture  Setup Time     Final   Value: 12/22/2013 13:02     Performed at [facility]   Culture     Final   Value:        BLOOD CULTURE RECEIVED NO GROWTH TO DATE CULTURE WILL BE HELD FOR 5 DAYS BEFORE ISSUING A FINAL NEGATIVE REPORT     Performed at [facility]   Report Status PENDING   Incomplete  CULTURE, BLOOD (ROUTINE X 2)     Status: None   Collection Time    12/21/13 11:40 PM      Result Value Ref Range Status   Specimen Description BLOOD LEFT HAND   Final   Special Requests BOTTLES DRAWN AEROBIC ONLY 5CC   Final   Culture  Setup Time     Final   Value: 12/22/2013 13:02     Performed at [facility]   Culture     Final   Value:        BLOOD CULTURE RECEIVED NO GROWTH TO DATE CULTURE WILL BE HELD FOR 5 DAYS BEFORE ISSUING A FINAL NEGATIVE REPORT     Performed at [facility]   Report Status PENDING   Incomplete    Studies/Results: Dg Chest 1 View  12/22/2013   CLINICAL DATA:  Central venous catheter placement.  EXAM: CHEST - 1 VIEW  COMPARISON:  09/23/2013.  FINDINGS: Increased cardiac silhouette. Clear lung fields. A central venous catheter has been placed from a LEFT IJ approach. Tip lies in the LEFT innominate vein. There is no pneumothorax. No osseous findings.  IMPRESSION: LEFT central venous catheter tip lies in the LEFT innominate vein. There is no visible pneumothorax.   Electronically Signed   By: Rolla Flatten  M.D.   On: 12/22/2013 17:56      Assessment/Plan:  Principal Problem:   Infected decubitus ulcer Active Problems:   Quadriplegia   Seizure disorder   HTN (hypertension)  OSA on CPAP   Anemia   UTI (lower urinary tract infection)   Wound infection   Decubitus ulcer of ischium, stage 3   Decubitus ulcer of sacral region, stage 3   Decubitus ulcer of back, stage 3   Decubitus ulcer of lower extremity, stage 2    [name] [name] is a 48 y.o. male with with Quadraplegia, pelvic osteomyelitis, mx decubitus ulcers with recent worsening drainage from ulcers last week and now foul smelling urine   #1 SIRS: continue zosyn for now to cover urinary pathogens. I doubt the decubitus infection is causing SIRS< sepsis picture UNLESS he is bactermic   #2 UTI: ok to continue zosyn for now, I cannot see the cultures were collected when he had his urinalysis. I am reordering cultures   #3 Decubitus ulcers and chronic osteomyelitis. He states he was having increased angle he purulent drainage some nasal ulcerations.  Dr. [name] see the patient tomorrow is my understanding. Can consider imaging again with another MRI his last MRI in May showed no changes compared to those done in February.  If he has significant disease progression root and desires to be treated aggressively would recommend deep debridement of bone for cultures.     LOS: 2 days   Alcide Evener 12/23/2013, 4:39 PM

## 2013-12-23 NOTE — Clinical Documentation Improvement (Signed)
Possible Clinical Conditions? Septicemia / Sepsis Severe Sepsis Septic Shock Sepsis with UTI Sepsis due to an internal device  Other Condition  Cannot clinically Determine   Supporting Information:(As per notes) Pt has multiple decubitus and UTI Vitals:12/21/13 2022 BP: 131/66 Pulse: 94 Temp: 99 F (37.2 C) TempSrc: Oral Resp: 15 SpO2: 99%  Labs: Recent Labs  Lab  12/21/13 2335   WBC  12.6*   NEUTROABS  9.0*   HGB  8.4*   HCT  27.7*   MCV  72.3*   PLT  502*      Thank You, Alessandra Grout, RN, BSN, CCDS,Clinical Documentation Specialist:  [phone]  [phone]=[facility]- Health Information Management

## 2013-12-23 NOTE — Consult Note (Signed)
WOC ostomy follow up Stoma type/location: LLQ Stomal assessment/size: LLQ 1 and 1.4 inches round, flush, pale pink Peristomal assessment: intact, clear Treatment options for stomal/peristomal skin: None indicated Output soft brown stool Ostomy pouching: 2pc., 2 and 3/4 inch pouching system Education provided: Patient's pouch changed today despite his weekly schedule warranting changes on Tuesdays.  Taught that he could easily change down into a 2 and 1/4 inch pouching system, but patient reminded me that he had many (many) supplies at home and that he might prefer to stay with what he has. Since there is no deleterious consequence to this plan, I agree. Enrolled patient in [place] Start Discharge program: No (patient is well established with supply chain)   WOC wound follow up Wound type:Pressure ulcers Measurement:As yesterday Wound bed:As yesterday Drainage (amount, consistency, odor) improved since yesterday according to [facility] Nancy Marus, who performed wound care yesterday Periwound:as per yesterday Dressing procedure/placement/frequency: I assisted RN Lissa Morales Nancy Marus with patient's wound care today and agree with assessments and POC established yesterday.  [facility] nursing team will not follow, but will remain available to this patient, the nursing and medical teams.  Please re-consult if needed. Thanks, Maudie Flakes, MSN, RN, [place], [place], [place] [phone])
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
## 2013-12-24 ENCOUNTER — Inpatient Hospital Stay (HOSPITAL_COMMUNITY): Payer: Medicare Other

## 2013-12-24 LAB — CBC WITH DIFFERENTIAL/PLATELET
Basophils Absolute: 0 10*3/uL (ref 0.0–0.1)
Basophils Relative: 0 % (ref 0–1)
EOS PCT: 6 % — AB (ref 0–5)
Eosinophils Absolute: 0.6 10*3/uL (ref 0.0–0.7)
HCT: 24.3 % — ABNORMAL LOW (ref 39.0–52.0)
Hemoglobin: 7.3 g/dL — ABNORMAL LOW (ref 13.0–17.0)
Lymphocytes Relative: 14 % (ref 12–46)
Lymphs Abs: 1.4 10*3/uL (ref 0.7–4.0)
MCH: 21.9 pg — AB (ref 26.0–34.0)
MCHC: 30 g/dL (ref 30.0–36.0)
MCV: 73 fL — ABNORMAL LOW (ref 78.0–100.0)
MONO ABS: 1 10*3/uL (ref 0.1–1.0)
Monocytes Relative: 10 % (ref 3–12)
Neutro Abs: 7.1 10*3/uL (ref 1.7–7.7)
Neutrophils Relative %: 70 % (ref 43–77)
PLATELETS: 377 10*3/uL (ref 150–400)
RBC: 3.33 MIL/uL — AB (ref 4.22–5.81)
RDW: 18.3 % — ABNORMAL HIGH (ref 11.5–15.5)
WBC: 10.1 10*3/uL (ref 4.0–10.5)

## 2013-12-24 NOTE — Progress Notes (Signed)
RT placed patient on CPAP with setting of 19 cmH20. Water chamber filled with sterile water for humidification. Patient tolerating well. RT will continue to monitor.

## 2013-12-24 NOTE — Progress Notes (Signed)
[facility]  Patient Status: Active (receiving services up to time of hospitalization)  [facility] is providing the following services: RN  If patient discharges after hours, please call [phone].   Lurlean Leyden 12/24/2013, 2:42 PM

## 2013-12-24 NOTE — Progress Notes (Signed)
[place] [facility]  #4 Zosyn he also received a dose of vancomycin  Subjective: [name]   Antibiotics:  Anti-infectives   Start     Dose/Rate Route Frequency Ordered Stop   12/22/13 1400  vancomycin (VANCOCIN) 1,250 mg in sodium chloride 0.9 % 250 mL IVPB  Status:  Discontinued     1,250 mg 166.7 mL/hr over 90 Minutes Intravenous Every 12 hours 12/22/13 0201 12/22/13 1132   12/22/13 0800  piperacillin-tazobactam (ZOSYN) IVPB 3.375 g     3.375 g 12.5 mL/hr over 240 Minutes Intravenous Every 8 hours 12/22/13 0146     12/22/13 0600  piperacillin-tazobactam (ZOSYN) IVPB 3.375 g  Status:  Discontinued     3.375 g 100 mL/hr over 30 Minutes Intravenous 3 times per day 12/22/13 0129 12/22/13 0146   12/22/13 0145  vancomycin (VANCOCIN) 2,500 mg in sodium chloride 0.9 % 500 mL IVPB     2,500 mg 250 mL/hr over 120 Minutes Intravenous  Once 12/22/13 0135 12/22/13 0428   12/21/13 2230  piperacillin-tazobactam (ZOSYN) IVPB 3.375 g     3.375 g 100 mL/hr over 30 Minutes Intravenous  Once 12/21/13 2216 12/22/13 1216   12/21/13 2215  vancomycin (VANCOCIN) IVPB 1000 mg/200 mL premix  Status:  Discontinued     1,000 mg 200 mL/hr over 60 Minutes Intravenous  Once 12/21/13 2211 12/22/13 0133      Medications: Scheduled Meds: . amLODipine  2.5 mg Oral q morning - 10a  . baclofen  20 mg Oral QID  . collagenase   Topical Daily  . docusate sodium  100 mg Oral BID  . enoxaparin (LOVENOX) injection  60 mg Subcutaneous Daily  . famotidine  20 mg Oral BID  . feeding supplement (ENSURE COMPLETE)  237 mL Oral BID BM  . feeding supplement (PRO-STAT SUGAR FREE 64)  30 mL Oral TID WC  . metoCLOPramide  10 mg Oral TID AC  . multivitamin with minerals  1 tablet Oral q morning - 10a  . piperacillin-tazobactam  3.375 g Intravenous Q8H  . potassium chloride SA  20 mEq Oral BID  . sodium chloride  250 mL Intravenous Once  . vitamin C  500 mg Oral q morning - 10a  . zinc  sulfate  220 mg Oral q morning - 10a   Continuous Infusions: . sodium chloride 125 mL/hr at 12/22/13 0024  . sodium chloride 75 mL/hr at 12/24/13 1156   PRN Meds:.acetaminophen, acetaminophen, alum & mag hydroxide-simeth, HYDROmorphone (DILAUDID) injection, ondansetron (ZOFRAN) IV, ondansetron, oxyCODONE, sodium chloride    Objective: Weight change:   Intake/Output Summary (Last 24 hours) at 12/24/13 1424 Last data filed at 12/24/13 0920  Gross per 24 hour  Intake    840 ml  Output   4100 ml  Net  -3260 ml   Blood pressure 106/58, pulse 88, temperature 98.8 F (37.1 C), temperature source Oral, resp. rate 16, height 6' (1.829 m), weight 226 lb (102.513 kg), SpO2 97.00%. Temp:  [98.8 F (37.1 C)-100 F (37.8 C)] 98.8 F (37.1 C) (08/11 0618) Pulse Rate:  [88-97] 88 (08/11 0618) Resp:  [16] 16 (08/11 0618) BP: (106-118)/(58) 106/58 mmHg (08/11 0618) SpO2:  [95 %-97 %] 97 % (08/11 0618)  Physical Exam: General: Alert and awake, oriented x3, not in any acute distress. 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    Left side:tunnel   Ulcer entered the patient's scrotum    Not well-visualized in this picture but at the bottom of the picture is that of deep fistula that is packed with "shoe string " packing     Upper back      Neuro: Quadriplegic  CBC:  Recent Labs Lab 12/21/13 2335 12/22/13 0843 12/23/13 0805  HGB 8.4* 8.2* 7.1*  HCT 27.7* 26.9* 23.3*  PLT 502* 409* 379     BMET  Recent Labs  12/22/13 0730 12/23/13 0805  NA 138 137  K 5.4* 3.9  CL 102 100  CO2 23 25  GLUCOSE 93 105*  BUN 20 13  CREATININE 0.44* 0.54  CALCIUM 9.1 8.6     Liver Panel  No results found for this basename: PROT,  ALBUMIN, AST, ALT, ALKPHOS, BILITOT, BILIDIR, IBILI,  in the last 72 hours     Sedimentation Rate No results found for this basename: ESRSEDRATE,  in the last 72 hours C-Reactive Protein No results found for this basename: CRP,  in the last 72 hours  Micro Results: Recent Results (from the past 720 hour(s))  CULTURE, BLOOD (ROUTINE X 2)     Status: None   Collection Time    12/21/13 11:35 PM      Result Value Ref Range Status   Specimen Description BLOOD LEFT HAND   Final   Special Requests BOTTLES DRAWN AEROBIC ONLY 5CC   Final   Culture  Setup Time     Final   Value: 12/22/2013 13:02     Performed at [facility]   Culture     Final   Value:        BLOOD CULTURE RECEIVED NO GROWTH TO DATE CULTURE WILL BE HELD FOR 5 DAYS BEFORE ISSUING A FINAL NEGATIVE REPORT     Performed at [facility]   Report Status PENDING   Incomplete  CULTURE, BLOOD (ROUTINE X 2)     Status: None   Collection Time    12/21/13 11:40 PM      Result Value Ref Range Status   Specimen Description BLOOD LEFT HAND   Final   Special Requests BOTTLES DRAWN AEROBIC ONLY 5CC   Final   Culture  Setup Time     Final   Value: 12/22/2013 13:02     Performed at [facility]   Culture     Final   Value:        BLOOD CULTURE RECEIVED NO GROWTH TO DATE CULTURE WILL BE HELD FOR 5 DAYS BEFORE ISSUING A FINAL NEGATIVE REPORT     Performed at [facility]   Report Status PENDING   Incomplete  CULTURE, BLOOD (ROUTINE X 2)     Status: None   Collection Time    12/22/13  6:30 PM      Result Value Ref Range Status   Specimen Description BLOOD RIGHT LINE  5 ML IN [facility] BOTTLE   Final   Special Requests Immunocompromised   Final   Culture  Setup Time     Final   Value: 12/23/2013 00:06     Performed at [facility]   Culture     Final   Value:        BLOOD CULTURE RECEIVED NO GROWTH TO DATE CULTURE WILL BE HELD FOR 5 DAYS BEFORE ISSUING A FINAL NEGATIVE REPORT     Performed at  [facility]   Report Status PENDING   Incomplete    Studies/Results: Dg Chest 1 View  12/22/2013   CLINICAL DATA:  Central venous catheter placement.  EXAM: CHEST - 1 VIEW  COMPARISON:  09/23/2013.  FINDINGS: Increased cardiac silhouette. Clear lung fields. A central venous catheter has been placed from a LEFT IJ approach. Tip lies in the LEFT innominate vein. There is no pneumothorax. No osseous findings.  IMPRESSION: LEFT central venous catheter tip lies in the LEFT innominate vein. There is no visible pneumothorax.   Electronically Signed   By: Rolla Flatten M.D.   On: 12/22/2013 17:56      Assessment/Plan:  Principal Problem:   Infected decubitus ulcer Active Problems:   Quadriplegia   Seizure disorder   HTN (hypertension)   OSA on CPAP   Anemia   UTI (lower urinary tract infection)   Wound infection   Decubitus ulcer of ischium, stage 3   Decubitus ulcer of sacral region, stage 3   Decubitus ulcer of back, stage 3   Decubitus ulcer of lower extremity, stage 2    [name] [name] is a 48 y.o. male with with Quadraplegia, pelvic osteomyelitis, mx decubitus ulcers with recent worsening drainage from ulcers last week and now foul smelling urine   #1 SIRS: continue zosyn for now to cover urinary pathogens. I doubt the decubitus infection is causing SIRS< sepsis picture UNLESS he is bactermic   #2 UTI: ok to continue zosyn for now, I cannot see the cultures were collected when he had his urinalysis. I am reordering cultures   #3 Decubitus ulcers and chronic osteomyelitis. He states he was having increased purulent drainage some sacral ulcerations.  Dr. [name] see the patient  is my understanding.  He is scheduled for another MRI.  his last MRI in May showed no changes compared to those done in February.  If he has significant disease progression root and desires to be treated aggressively would recommend deep debridement of bone for cultures probably after an antibiotic  holiday     LOS: 3 days   Alcide Evener 12/24/2013, 2:24 PM

## 2013-12-24 NOTE — Progress Notes (Signed)
Pt. cannot fit on [facility] Will try [facility] in am  [name], MD [facility] [phone]

## 2013-12-24 NOTE — Progress Notes (Signed)
Note: This document was prepared with digital dictation and possible smart phrase technology. Any transcriptional errors that result from this process are unintentional.   [name] [name] [id number] DOB: 09/03/1965 DOA: 12/21/2013 PCP: [name], MD  Brief narrative: 48 y/o ? c Quadraplegia [c5# 1991], non healing sacral wound stg IV, recurrent osteo well known to Id service/ Plastic surgery: OSA on cpap, seizure disorder, Anemia, h/o suprapubic cathter + diverting colostomy admited 12/22/13 c suspected pyelonephritis [see extensive note as per Lucianne [facility]'s [facility] consutl 8/9]  Has chronic indwelling foley. Has not been on IV abx for a while but has been on suppressive bactrim DS. Was seen by plastic surgeon last week and had wounds debrided    Past medical history-As per Problem list Chart reviewed as below- reviewed  Consultants:  ID  Procedures:  none  Antibiotics:  Vacomycin 8/9  Zosyn 8/9-->   Subjective   Felt warm last pm No n/v/cp Being fed lunch bedside   Objective    Interim History:   Telemetry: none   Objective: Filed Vitals:   12/23/13 0546 12/23/13 1355 12/23/13 2147 12/24/13 0618  BP: 95/64 101/57 118/58 106/58  Pulse: 94 88 97 88  Temp: 99.5 F (37.5 C) 98.7 F (37.1 C) 100 F (37.8 C) 98.8 F (37.1 C)  TempSrc: Oral Oral Oral Oral  Resp: 17 16 16 16   Height:      Weight:      SpO2: 95% 95% 95% 97%    Intake/Output Summary (Last 24 hours) at 12/24/13 1330 Last data filed at 12/24/13 0920  Gross per 24 hour  Intake   1080 ml  Output   4900 ml  Net  -3820 ml    Exam:  General: Alert pleasant oriented no apparent distress no fevers overnight Cardiovascular: S1-S2 no murmur rub or gallop Respiratory: Clinically clear no added sound Abdomen: Soft nontender nondistended without guarding Skin not examined the wounds Neuro intact to upper extremities did not exam lower  Data Reviewed: Basic Metabolic Panel:  Recent Labs Lab  12/21/13 2335 12/22/13 0730 12/23/13 0805  NA 138 138 137  K 3.9 5.4* 3.9  CL 101 102 100  CO2 24 23 25   GLUCOSE 114* 93 105*  BUN 24* 20 13  CREATININE 0.45* 0.44* 0.54  CALCIUM 9.5 9.1 8.6   Liver Function Tests: No results found for this basename: AST, ALT, ALKPHOS, BILITOT, PROT, ALBUMIN,  in the last 168 hours No results found for this basename: LIPASE, AMYLASE,  in the last 168 hours No results found for this basename: AMMONIA,  in the last 168 hours CBC:  Recent Labs Lab 12/21/13 2335 12/22/13 0843 12/23/13 0805  WBC 12.6* 12.3* 11.8*  NEUTROABS 9.0*  --  8.4*  HGB 8.4* 8.2* 7.1*  HCT 27.7* 26.9* 23.3*  MCV 72.3* 71.9* 72.6*  PLT 502* 409* 379   Cardiac Enzymes: No results found for this basename: CKTOTAL, CKMB, CKMBINDEX, TROPONINI,  in the last 168 hours BNP: No components found with this basename: POCBNP,  CBG: No results found for this basename: GLUCAP,  in the last 168 hours  Recent Results (from the past 240 hour(s))  CULTURE, BLOOD (ROUTINE X 2)     Status: None   Collection Time    12/21/13 11:35 PM      Result Value Ref Range Status   Specimen Description BLOOD LEFT HAND   Final   Special Requests BOTTLES DRAWN AEROBIC ONLY 5CC   Final   Culture  Setup  Time     Final   Value: 12/22/2013 13:02     Performed at [facility]   Culture     Final   Value:        BLOOD CULTURE RECEIVED NO GROWTH TO DATE CULTURE WILL BE HELD FOR 5 DAYS BEFORE ISSUING A FINAL NEGATIVE REPORT     Performed at [facility]   Report Status PENDING   Incomplete  CULTURE, BLOOD (ROUTINE X 2)     Status: None   Collection Time    12/21/13 11:40 PM      Result Value Ref Range Status   Specimen Description BLOOD LEFT HAND   Final   Special Requests BOTTLES DRAWN AEROBIC ONLY 5CC   Final   Culture  Setup Time     Final   Value: 12/22/2013 13:02     Performed at [facility]   Culture     Final   Value:        BLOOD CULTURE RECEIVED NO GROWTH TO DATE  CULTURE WILL BE HELD FOR 5 DAYS BEFORE ISSUING A FINAL NEGATIVE REPORT     Performed at [facility]   Report Status PENDING   Incomplete  CULTURE, BLOOD (ROUTINE X 2)     Status: None   Collection Time    12/22/13  6:30 PM      Result Value Ref Range Status   Specimen Description BLOOD RIGHT LINE  5 ML IN [facility] BOTTLE   Final   Special Requests Immunocompromised   Final   Culture  Setup Time     Final   Value: 12/23/2013 00:06     Performed at [facility]   Culture     Final   Value:        BLOOD CULTURE RECEIVED NO GROWTH TO DATE CULTURE WILL BE HELD FOR 5 DAYS BEFORE ISSUING A FINAL NEGATIVE REPORT     Performed at [facility]   Report Status PENDING   Incomplete     Studies:              All Imaging reviewed and is as per above notation   Scheduled Meds: . amLODipine  2.5 mg Oral q morning - 10a  . baclofen  20 mg Oral QID  . collagenase   Topical Daily  . docusate sodium  100 mg Oral BID  . enoxaparin (LOVENOX) injection  60 mg Subcutaneous Daily  . famotidine  20 mg Oral BID  . feeding supplement (ENSURE COMPLETE)  237 mL Oral BID BM  . feeding supplement (PRO-STAT SUGAR FREE 64)  30 mL Oral TID WC  . metoCLOPramide  10 mg Oral TID AC  . multivitamin with minerals  1 tablet Oral q morning - 10a  . piperacillin-tazobactam  3.375 g Intravenous Q8H  . potassium chloride SA  20 mEq Oral BID  . sodium chloride  250 mL Intravenous Once  . vitamin C  500 mg Oral q morning - 10a  . zinc sulfate  220 mg Oral q morning - 10a   Continuous Infusions: . sodium chloride 125 mL/hr at 12/22/13 0024  . sodium chloride 75 mL/hr at 12/24/13 1156     Assessment/Plan:  1. Sepsis-multiple etiologies possible Decubit vs pyelonephritis in this case. Dr [name] aware of patient.  Patient had central line placed 12/22/13.  Antibiotics have been narrowed to just Zosyn.  Repeat CBC a.m. await blood culture--UC apparently never sent?  Foll cult done  8/1 2. Recurrent  osteomyelitis-see above 3. Paraplegia C5 since 1991-stable 4. Obstructive sleep apnea and CPAP-continue CPAP each bedtime 5. Microcytic anemia probably of chronic disease-type and screen and transfuse 2 units of blood if below 7,  7.1--Await rpt from today 8/11  Code Status: Full Family Communication:  None Disposition Plan: inpatient   [name], MD  [facility] Pager [phone] 12/24/2013, 1:30 PM    LOS: 3 days

## 2013-12-25 ENCOUNTER — Encounter (HOSPITAL_COMMUNITY): Payer: Self-pay | Admitting: Physician Assistant

## 2013-12-25 ENCOUNTER — Ambulatory Visit (HOSPITAL_COMMUNITY)
Admit: 2013-12-25 | Discharge: 2013-12-25 | Disposition: A | Discharge status: Home / Self Care | Payer: Medicare Other | Attending: Family Medicine | Admitting: Family Medicine

## 2013-12-25 DIAGNOSIS — L03317 Cellulitis of buttock: Secondary | ICD-10-CM

## 2013-12-25 DIAGNOSIS — L02419 Cutaneous abscess of limb, unspecified: Secondary | ICD-10-CM

## 2013-12-25 DIAGNOSIS — L0231 Cutaneous abscess of buttock: Secondary | ICD-10-CM

## 2013-12-25 DIAGNOSIS — L89109 Pressure ulcer of unspecified part of back, unspecified stage: Secondary | ICD-10-CM

## 2013-12-25 DIAGNOSIS — L03119 Cellulitis of unspecified part of limb: Secondary | ICD-10-CM

## 2013-12-25 DIAGNOSIS — M86659 Other chronic osteomyelitis, unspecified thigh: Secondary | ICD-10-CM

## 2013-12-25 LAB — BASIC METABOLIC PANEL
Anion gap: 9 (ref 5–15)
BUN: 10 mg/dL (ref 6–23)
CO2: 25 mEq/L (ref 19–32)
Calcium: 8.8 mg/dL (ref 8.4–10.5)
Chloride: 107 mEq/L (ref 96–112)
Creatinine, Ser: 0.47 mg/dL — ABNORMAL LOW (ref 0.50–1.35)
GFR calc Af Amer: >90 mL/min (ref >90)
GFR calc non Af Amer: >90 mL/min (ref >90)
Glucose, Bld: 96 mg/dL (ref 70–99)
POTASSIUM: 4 meq/L (ref 3.7–5.3)
Sodium: 141 mEq/L (ref 137–147)

## 2013-12-25 LAB — CBC
HCT: 23.9 % — ABNORMAL LOW (ref 39.0–52.0)
Hemoglobin: 7.1 g/dL — ABNORMAL LOW (ref 13.0–17.0)
MCH: 21.8 pg — ABNORMAL LOW (ref 26.0–34.0)
MCHC: 29.7 g/dL — AB (ref 30.0–36.0)
MCV: 73.5 fL — ABNORMAL LOW (ref 78.0–100.0)
Platelets: 407 10*3/uL — ABNORMAL HIGH (ref 150–400)
RBC: 3.25 MIL/uL — ABNORMAL LOW (ref 4.22–5.81)
RDW: 18.4 % — AB (ref 11.5–15.5)
WBC: 9.9 10*3/uL (ref 4.0–10.5)

## 2013-12-25 MED ORDER — ENOXAPARIN SODIUM 60 MG/0.6ML ~~LOC~~ SOLN
50.0000 mg | Freq: Every day | SUBCUTANEOUS | Status: DC
  Administered 2013-12-26 – 2014-01-06 (×10): 50 mg via SUBCUTANEOUS
  Filled 2013-12-25 (×13): qty 0.6

## 2013-12-25 MED ORDER — FOSFOMYCIN TROMETHAMINE 3 G PO PACK
3.0000 g | PACK | Freq: Once | ORAL | Status: AC
  Administered 2013-12-25: 3 g via ORAL
  Filled 2013-12-25: qty 3

## 2013-12-25 MED ORDER — GADOBENATE DIMEGLUMINE 529 MG/ML IV SOLN
20.0000 mL | Freq: Once | INTRAVENOUS | Status: AC | PRN
  Administered 2013-12-25: 20 mL via INTRAVENOUS

## 2013-12-25 NOTE — Progress Notes (Signed)
ANTIBIOTIC CONSULT NOTE - Follow up  Pharmacy Consult for Zosyn Indication: Wound infection/UTI  Allergies  Allergen Reactions  . Ditropan [Oxybutynin] Other (See Comments)    hallucinations    Patient Measurements: Height: 6' (182.9 cm) Weight: 226 lb (102.513 kg) IBW/kg (Calculated) : 77.6  Vital Signs: Temp: 98 F (36.7 C) (08/12 0630) Temp src: Oral (08/12 0630) BP: 132/81 mmHg (08/12 0630) Pulse Rate: 79 (08/12 0630) Intake/Output from previous day: 08/11 0701 - 08/12 0700 In: 5297.5 [P.O.:2160; I.V.:2887.5; IV Piggyback:250] Out: 4008 [Urine:5800; Stool:750] Intake/Output from this shift: Total I/O In: 240 [P.O.:240] Out: 625 [Urine:625]  Labs:  Recent Labs  12/23/13 0805 12/24/13 1400 12/25/13 0500  WBC 11.8* 10.1 9.9  HGB 7.1* 7.3* 7.1*  PLT 379 377 407*  CREATININE 0.54  --  0.47*   Estimated Creatinine Clearance: 141.4 ml/min (by C-G formula based on Cr of 0.47). No results found for this basename: VANCOTROUGH, VANCOPEAK, VANCORANDOM, GENTTROUGH, GENTPEAK, GENTRANDOM, TOBRATROUGH, TOBRAPEAK, TOBRARND, AMIKACINPEAK, AMIKACINTROU, AMIKACIN,  in the last 72 hours   Microbiology: Recent Results (from the past 720 hour(s))  CULTURE, BLOOD (ROUTINE X 2)     Status: None   Collection Time    12/21/13 11:35 PM      Result Value Ref Range Status   Specimen Description BLOOD LEFT HAND   Final   Special Requests BOTTLES DRAWN AEROBIC ONLY 5CC   Final   Culture  Setup Time     Final   Value: 12/22/2013 13:02     Performed at [facility]   Culture     Final   Value:        BLOOD CULTURE RECEIVED NO GROWTH TO DATE CULTURE WILL BE HELD FOR 5 DAYS BEFORE ISSUING A FINAL NEGATIVE REPORT     Performed at [facility]   Report Status PENDING   Incomplete  CULTURE, BLOOD (ROUTINE X 2)     Status: None   Collection Time    12/21/13 11:40 PM      Result Value Ref Range Status   Specimen Description BLOOD LEFT HAND   Final   Special Requests  BOTTLES DRAWN AEROBIC ONLY 5CC   Final   Culture  Setup Time     Final   Value: 12/22/2013 13:02     Performed at [facility]   Culture     Final   Value:        BLOOD CULTURE RECEIVED NO GROWTH TO DATE CULTURE WILL BE HELD FOR 5 DAYS BEFORE ISSUING A FINAL NEGATIVE REPORT     Performed at [facility]   Report Status PENDING   Incomplete  CULTURE, BLOOD (ROUTINE X 2)     Status: None   Collection Time    12/22/13  6:30 PM      Result Value Ref Range Status   Specimen Description BLOOD RIGHT LINE  5 ML IN [facility] BOTTLE   Final   Special Requests Immunocompromised   Final   Culture  Setup Time     Final   Value: 12/23/2013 00:06     Performed at [facility]   Culture     Final   Value:        BLOOD CULTURE RECEIVED NO GROWTH TO DATE CULTURE WILL BE HELD FOR 5 DAYS BEFORE ISSUING A FINAL NEGATIVE REPORT     Performed at [facility]   Report Status PENDING   Incomplete  URINE CULTURE  Status: None   Collection Time    12/23/13  6:21 PM      Result Value Ref Range Status   Specimen Description URINE, CATHETERIZED   Final   Special Requests Immunocompromised   Final   Culture  Setup Time     Final   Value: 12/24/2013 00:28     Performed at [place]     Final   Value: 25,000 COLONIES/ML     Performed at [facility]   Culture     Final   Value: GRAM NEGATIVE RODS     Performed at [facility]   Report Status PENDING   Incomplete   Assessment: 48 y.o. male admitted 8/9 for evaluation of strong smelling dark urine, and drainage from his buttocks wounds, known osteomyelitis. Quadriparetic, well known to Psychiatric nurse. He saw his wound management doctor recently for evaluation and treatment of his chronic sacral decubiti. Zosyn per Rx for wound infection.   8/9 >> vancomycin >> 8/9 8/9 >> zosyn >>  Tmax: AF WBCs: wnl now Renal: SCr low, CrCl > 100 ml/min CG/N  8/8 UA: grossly positive for UTI 8/8  blood x2: ngtd 8/9 blood: ngtd 8/10 urine: GNR  Goal of Therapy:  Treat infection  Plan:   Cont Zosyn 3.375g IV Q8H infused over 4hrs.  F/U SCr and cultures daily.  Romeo Rabon, PharmD, pager [phone]. 12/25/2013,12:21 PM.
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
## 2013-12-25 NOTE — Progress Notes (Signed)
[facility]  #5 Zosyn he also received a dose of vancomycin  Subjective: [name]   Antibiotics:  Anti-infectives   Start     Dose/Rate Route Frequency Ordered Stop   12/22/13 1400  vancomycin (VANCOCIN) 1,250 mg in sodium chloride 0.9 % 250 mL IVPB  Status:  Discontinued     1,250 mg 166.7 mL/hr over 90 Minutes Intravenous Every 12 hours 12/22/13 0201 12/22/13 1132   12/22/13 0800  piperacillin-tazobactam (ZOSYN) IVPB 3.375 g  Status:  Discontinued     3.375 g 12.5 mL/hr over 240 Minutes Intravenous Every 8 hours 12/22/13 0146 12/25/13 1628   12/22/13 0600  piperacillin-tazobactam (ZOSYN) IVPB 3.375 g  Status:  Discontinued     3.375 g 100 mL/hr over 30 Minutes Intravenous 3 times per day 12/22/13 0129 12/22/13 0146   12/22/13 0145  vancomycin (VANCOCIN) 2,500 mg in sodium chloride 0.9 % 500 mL IVPB     2,500 mg 250 mL/hr over 120 Minutes Intravenous  Once 12/22/13 0135 12/22/13 0428   12/21/13 2230  piperacillin-tazobactam (ZOSYN) IVPB 3.375 g     3.375 g 100 mL/hr over 30 Minutes Intravenous  Once 12/21/13 2216 12/22/13 1216   12/21/13 2215  vancomycin (VANCOCIN) IVPB 1000 mg/200 mL premix  Status:  Discontinued     1,000 mg 200 mL/hr over 60 Minutes Intravenous  Once 12/21/13 2211 12/22/13 0133      Medications: Scheduled Meds: . amLODipine  2.5 mg Oral q morning - 10a  . baclofen  20 mg Oral QID  . collagenase   Topical Daily  . docusate sodium  100 mg Oral BID  . [START ON 12/26/2013] enoxaparin (LOVENOX) injection  50 mg Subcutaneous Daily  . famotidine  20 mg Oral BID  . feeding supplement (ENSURE COMPLETE)  237 mL Oral BID BM  . feeding supplement (PRO-STAT SUGAR FREE 64)  30 mL Oral TID WC  . fosfomycin  3 g Oral Once  . metoCLOPramide  10 mg Oral TID AC  . multivitamin with minerals  1 tablet Oral q morning - 10a  . potassium chloride SA  20 mEq Oral BID  . sodium chloride  250 mL Intravenous Once  . vitamin C  500 mg  Oral q morning - 10a  . zinc sulfate  220 mg Oral q morning - 10a   Continuous Infusions: . sodium chloride 125 mL/hr at 12/22/13 0024  . sodium chloride 75 mL/hr at 12/25/13 0330   PRN Meds:.acetaminophen, acetaminophen, alum & mag hydroxide-simeth, HYDROmorphone (DILAUDID) injection, ondansetron (ZOFRAN) IV, ondansetron, oxyCODONE, sodium chloride    Objective: Weight change:   Intake/Output Summary (Last 24 hours) at 12/25/13 1628 Last data filed at 12/25/13 1313  Gross per 24 hour  Intake 5057.5 ml  Output   4175 ml  Net  882.5 ml   Blood pressure 125/80, pulse 82, temperature 98.2 F (36.8 C), temperature source Oral, resp. rate 16, height 6' (1.829 m), weight 226 lb (102.513 kg), SpO2 97.00%. Temp:  [98 F (36.7 C)-99.5 F (37.5 C)] 98.2 F (36.8 C) (08/12 1445) Pulse Rate:  [79-96] 82 (08/12 1445) Resp:  [16] 16 (08/12 1445) BP: (110-132)/(53-81) 125/80 mmHg (08/12 1445) SpO2:  [92 %-97 %] 97 % (08/12 1445)  Physical Exam: General: Alert and awake, oriented x3, not in any acute distress. HEENT: anicteric sclera, EOMI CVS regular rate, normal  Chest: No wheezing no respiratory distress Abdomen: soft nontender, nondistended, normal bowel sounds, Skin: See pictures from  yesterday regarding various ulcerations  Right side lateral wound deep stage IV with tunnelling tqaken 2 days ago    Left side:tunnel 2 days ago pic   Ulcer entered the patient's scrotum 2 days ago pic    Not well-visualized in this picture but at the bottom of the picture is that of deep fistula that is packed with "shoe string " packing 2 days ago pic     Upper back 2 days ago pic      Neuro: Quadriplegic  CBC:  Recent Labs Lab 12/21/13 2335 12/22/13 0843 12/23/13 0805 12/24/13 1400 12/25/13 0500  HGB 8.4* 8.2* 7.1* 7.3* 7.1*  HCT 27.7* 26.9* 23.3* 24.3* 23.9*  PLT 502* 409* 379 377 407*     BMET  Recent Labs  12/23/13 0805 12/25/13 0500  NA 137 141  K 3.9 4.0   CL 100 107  CO2 25 25  GLUCOSE 105* 96  BUN 13 10  CREATININE 0.54 0.47*  CALCIUM 8.6 8.8     Liver Panel  No results found for this basename: PROT, ALBUMIN, AST, ALT, ALKPHOS, BILITOT, BILIDIR, IBILI,  in the last 72 hours     Sedimentation Rate No results found for this basename: ESRSEDRATE,  in the last 72 hours C-Reactive Protein No results found for this basename: CRP,  in the last 72 hours  Micro Results: Recent Results (from the past 720 hour(s))  CULTURE, BLOOD (ROUTINE X 2)     Status: None   Collection Time    12/21/13 11:35 PM      Result Value Ref Range Status   Specimen Description BLOOD LEFT HAND   Final   Special Requests BOTTLES DRAWN AEROBIC ONLY 5CC   Final   Culture  Setup Time     Final   Value: 12/22/2013 13:02     Performed at [facility]   Culture     Final   Value:        BLOOD CULTURE RECEIVED NO GROWTH TO DATE CULTURE WILL BE HELD FOR 5 DAYS BEFORE ISSUING A FINAL NEGATIVE REPORT     Performed at [facility]   Report Status PENDING   Incomplete  CULTURE, BLOOD (ROUTINE X 2)     Status: None   Collection Time    12/21/13 11:40 PM      Result Value Ref Range Status   Specimen Description BLOOD LEFT HAND   Final   Special Requests BOTTLES DRAWN AEROBIC ONLY 5CC   Final   Culture  Setup Time     Final   Value: 12/22/2013 13:02     Performed at [facility]   Culture     Final   Value:        BLOOD CULTURE RECEIVED NO GROWTH TO DATE CULTURE WILL BE HELD FOR 5 DAYS BEFORE ISSUING A FINAL NEGATIVE REPORT     Performed at [facility]   Report Status PENDING   Incomplete  CULTURE, BLOOD (ROUTINE X 2)     Status: None   Collection Time    12/22/13  6:30 PM      Result Value Ref Range Status   Specimen Description BLOOD RIGHT LINE  5 ML IN [facility] BOTTLE   Final   Special Requests Immunocompromised   Final   Culture  Setup Time     Final   Value: 12/23/2013 00:06     Performed at [place]      Final  Value:        BLOOD CULTURE RECEIVED NO GROWTH TO DATE CULTURE WILL BE HELD FOR 5 DAYS BEFORE ISSUING A FINAL NEGATIVE REPORT     Performed at [facility]   Report Status PENDING   Incomplete  URINE CULTURE     Status: None   Collection Time    12/23/13  6:21 PM      Result Value Ref Range Status   Specimen Description URINE, CATHETERIZED   Final   Special Requests Immunocompromised   Final   Culture  Setup Time     Final   Value: 12/24/2013 00:28     Performed at [facility]     Final   Value: 25,000 COLONIES/ML     Performed at [facility]   Culture     Final   Value: GRAM NEGATIVE RODS     Performed at [facility]   Report Status PENDING   Incomplete    Studies/Results: Mr Pelvis W Wo Contrast  12/25/2013   CLINICAL DATA:  Quadriplegic patient with nonhealing decubitus ulcers.  EXAM: MRI PELVIS WITHOUT AND WITH CONTRAST  TECHNIQUE: Multiplanar multisequence MR imaging of the pelvis was performed both before and after administration of intravenous contrast.  CONTRAST:  20 mL MULTIHANCE GADOBENATE DIMEGLUMINE 529 MG/ML IV SOLN  COMPARISON:  CT pelvis 02/27/2013. MRI of the pelvis 09/23/2013 and 06/26/2013.  FINDINGS: Large decubitus ulcers are again identified. Ulcer about the posterior aspect of the right hip is seen with an air and fluid collection off of the lateral margin of the right ilium on the right gluteal musculature measuring 7.6 cm transverse by 2.6 cm AP by 4.5 cm craniocaudal. The collection is highly suspicious for abscess and appears to have a thin band of communication to the skin surface. Inferior to the right hip, a second fluid collection measuring 2.7 cm craniocaudal by 5.0 cm AP x 3.5 cm transverse which also demonstrates marked rim enhancement and is worrisome for abscess. 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 Markedly decreased T1 and T2 signal in the visualized proximal right femurs  consistent with chronic osteomyelitis. Small focus of marrow edema in the right ilium is unchanged.  A large sacral decubitus ulcer is identified as on the prior studies. Small focus of edema and enhancement in the posterior aspect of the left ilium adjacent to the sacroiliac joint is unchanged to slightly smaller and compatible with chronic osteomyelitis. No fluid collection is identified.  Decubitus ulcer over the left hip is poorly seen due to artifact. Osteolysis of the left femoral head is unchanged. There is a fluid collection in the left hip just below the roof of the acetabulum measuring 2.5 cm transverse by 1.7 cm craniocaudal by 2.3 cm AP. The collection is not definitely seen on the prior CT scan and is worrisome for abscess.  IMPRESSION: Dominant finding are new fluid collections in the right buttock adjacent to the right ilium and deep to the proximal right femur highly suspicious for abscesses. The collection in the right buttock appears to have a thin communication to the skin surface.  Small fluid collection in the left hip just deep to the roof of the left left acetabulum is not definitely seen on the prior examinations and worrisome for abscess.  Sacral decubitus ulcer without underlying abscess.  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 Chronic osteomyelitis deep to the patient's sacral decubitus ulcer is also identified.   Electronically  Signed   By: Inge Rise M.D.   On: 12/25/2013 12:27      Assessment/Plan:  Principal Problem:   Infected decubitus ulcer Active Problems:   Quadriplegia   Seizure disorder   HTN (hypertension)   OSA on CPAP   Anemia   UTI (lower urinary tract infection)   Wound infection   Decubitus ulcer of ischium, stage 3   Decubitus ulcer of sacral region, stage 3   Decubitus ulcer of back, stage 3   Decubitus ulcer of lower extremity, stage 2    [name] [name] is a 48 y.o. male with with Quadraplegia, pelvic osteomyelitis, mx decubitus  ulcers with recent worsening drainage from ulcers last week and now foul smelling urine   #1 Decubitus ulcers imaged with MRI and now pt found to have new abscesses on right near femur and ilium--likely what had previously suddenly started draining. Chronic osteomyelitis present:  --Greatly appreciate Dr. [name] seeing the patient. --He will need I and D of abscesses that are new--> pus for culture and consideration of bone biopsy for culture as well --I am dcing the zosyn to reduce systemic antibiotics in his body prior to getting deep cultures  #2 UTI: his repeat culture on abx growing GNR. He is on 5th day of zosyn. I will give one dose of fosfomycin which will only act at level of he bladder but givre him 3 more days of therapy. It would NOT have been appropriate abx when pt presented and was with SIRS but I think it is reasonable "mop up" abx now that he is stable and should only act at level of his urine and not effect abscess or bone cultures     LOS: 4 days   Alcide Evener 12/25/2013, 4:28 PM

## 2013-12-25 NOTE — Progress Notes (Signed)
Pt placed on CPAP via [facility] Current settings are 19 CMh20 per home settings.  Pt tolerating well at this time, RT to monitor and assess as needed.

## 2013-12-25 NOTE — Care Management Note (Unsigned)
    Page 1 of 2   01/02/2014     4:13:27 PM CARE MANAGEMENT NOTE 01/02/2014  Patient:  [name] [name],[name] [name]   Account Number:  [id number]  Date Initiated:  12/25/2013  Documentation initiated by:  [facility]  Subjective/Objective Assessment:   adm: UTI/decubitus ulcer     Action/Plan:   discharge planning   Anticipated DC Date:  12/25/2013   Anticipated DC Plan:  [place]  CM consult      [facility] Choice  Resumption Of Svcs/PTA Provider   Choice offered to / List presented to:     DME arranged  [facility]      DME agency  [facility]     HH arranged  HH-1 RN      [place].   Status of service:  In process, will continue to follow Medicare Important Message given?  YES (If response is "NO", the following Medicare IM given date fields will be blank) Date Medicare IM given:  12/24/2013 Medicare IM given by:   Date Additional Medicare IM given:  01/01/2014 Additional Medicare IM given by:  [facility] Finlay [place]bee  Discharge Disposition:  [place]  Per UR Regulation:    If discussed at Long Length of Stay Meetings, dates discussed:    Comments:  01/02/14 16:10 CM called [facility] rep, Rickie to confirm fax number.  CM faxed order form, facesheet, OP note, measurements, and H&P to [facility].  CM will continue to follow.  Mariane Masters, BSN, [place].  01/01/14 12:00 CM spoke with pt who states he has had a wound Vac before from [facility] and home IV ABX.  CM waiting for IV ABX orders.  CM called [facility] rep for IV ABX arrangements. CM placed [facility] request form on shadow chart for MD to complete.  Will continue to follow for disposition.  Mariane Masters, BSN, [place] [phone].  12/25/13 08:00 Pt is from home with personal caretaker (paraplegia); and is active with [facility] for [facility].  Will follow for disposition needs.  Have requested resumption order for MD for [facility].  Mariane Masters, BSN, CM [phone].

## 2013-12-25 NOTE — Progress Notes (Addendum)
[facility] PROGRESS NOTE  [name] [name] OFB:[id number] DOB: [birth year] DOA: 12/21/2013 PCP: [name], MD  Assessment/Plan: Sepsis  possibly secondary to new pelvic abscesses as seen on MRI vs UTI. MRI pelvis done today shows new fluid collection adjacent to right ilium and deep to proximal right femur highly suspicious for new abscess Appreciate ID and plastic sx recommendation. Plan on OR for debridement dced zosyn ( after 5 days to improve deep tissue cx yield)  UTI  cx growing GNR. Given a dose of fosfomycin today. Dc zosyn   OSA Cont bedtime CPAP  Microcytic anemia Hb of 7.1. Monitor in am  Paraplegia  Stable. Has deep sacral decub   DVT prophylaxis   Code Status: full Family Communication:none Disposition Plan: currently inpt   Consultants:  Plastic sx  ID  Procedures:  MRI on 8/12  Antibiotics: IV zosyn ( day 5)   HPI/Subjective: Seen after returning from MRI. 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 and suprapubic catheter  Musculoskeletal: quadreplegic  Data Reviewed: Basic Metabolic Panel:  Recent Labs Lab 12/21/13 2335 12/22/13 0730 12/23/13 0805 12/25/13 0500  NA 138 138 137 141  K 3.9 5.4* 3.9 4.0  CL 101 102 100 107  CO2 24 23 25 25   GLUCOSE 114* 93 105* 96  BUN 24* 20 13 10   CREATININE 0.45* 0.44* 0.54 0.47*  CALCIUM 9.5 9.1 8.6 8.8   Liver Function Tests: No results found for this basename: AST, ALT, ALKPHOS, BILITOT, PROT, ALBUMIN,  in the last 168 hours No  results found for this basename: LIPASE, AMYLASE,  in the last 168 hours No results found for this basename: AMMONIA,  in the last 168 hours CBC:  Recent Labs Lab 12/21/13 2335 12/22/13 0843 12/23/13 0805 12/24/13 1400 12/25/13 0500  WBC 12.6* 12.3* 11.8* 10.1 9.9  NEUTROABS 9.0*  --  8.4* 7.1  --   HGB 8.4* 8.2* 7.1* 7.3* 7.1*  HCT 27.7* 26.9* 23.3* 24.3* 23.9*  MCV 72.3* 71.9* 72.6* 73.0* 73.5*  PLT 502* 409* 379 377 407*   Cardiac Enzymes: No results found for this basename: CKTOTAL, CKMB, CKMBINDEX, TROPONINI,  in the last 168 hours BNP (last 3 results)  Recent Labs  07/19/13 1938  PROBNP 79.5   CBG: No results found for this basename: GLUCAP,  in the last 168 hours  Recent Results (from the past 240 hour(s))  CULTURE, BLOOD (ROUTINE X 2)     Status: None   Collection Time    12/21/13 11:35 PM      Result Value Ref Range Status   Specimen Description BLOOD LEFT HAND   Final   Special Requests BOTTLES DRAWN AEROBIC ONLY 5CC   Final   Culture  Setup Time     Final   Value: 12/22/2013 13:02     Performed at [facility]   Culture     Final   Value:  BLOOD CULTURE RECEIVED NO GROWTH TO DATE CULTURE WILL BE HELD FOR 5 DAYS BEFORE ISSUING A FINAL NEGATIVE REPORT     Performed at [facility]   Report Status PENDING   Incomplete  CULTURE, BLOOD (ROUTINE X 2)     Status: None   Collection Time    12/21/13 11:40 PM      Result Value Ref Range Status   Specimen Description BLOOD LEFT HAND   Final   Special Requests BOTTLES DRAWN AEROBIC ONLY 5CC   Final   Culture  Setup Time     Final   Value: 12/22/2013 13:02     Performed at [facility]   Culture     Final   Value:        BLOOD CULTURE RECEIVED NO GROWTH TO DATE CULTURE WILL BE HELD FOR 5 DAYS BEFORE ISSUING A FINAL NEGATIVE REPORT     Performed at [facility]   Report Status PENDING   Incomplete  CULTURE, BLOOD (ROUTINE X 2)     Status: None   Collection Time    12/22/13   6:30 PM      Result Value Ref Range Status   Specimen Description BLOOD RIGHT LINE  5 ML IN [facility] BOTTLE   Final   Special Requests Immunocompromised   Final   Culture  Setup Time     Final   Value: 12/23/2013 00:06     Performed at [facility]   Culture     Final   Value:        BLOOD CULTURE RECEIVED NO GROWTH TO DATE CULTURE WILL BE HELD FOR 5 DAYS BEFORE ISSUING A FINAL NEGATIVE REPORT     Performed at [facility]   Report Status PENDING   Incomplete  URINE CULTURE     Status: None   Collection Time    12/23/13  6:21 PM      Result Value Ref Range Status   Specimen Description URINE, CATHETERIZED   Final   Special Requests Immunocompromised   Final   Culture  Setup Time     Final   Value: 12/24/2013 00:28     Performed at [facility] Count     Final   Value: 25,000 COLONIES/ML     Performed at [facility]   Culture     Final   Value: GRAM NEGATIVE RODS     Performed at [facility]   Report Status PENDING   Incomplete     Studies: Mr Pelvis W Wo Contrast  12/25/2013   CLINICAL DATA:  Quadriplegic patient with nonhealing decubitus ulcers.  EXAM: MRI PELVIS WITHOUT AND WITH CONTRAST  TECHNIQUE: Multiplanar multisequence MR imaging of the pelvis was performed both before and after administration of intravenous contrast.  CONTRAST:  20 mL MULTIHANCE GADOBENATE DIMEGLUMINE 529 MG/ML IV SOLN  COMPARISON:  CT pelvis 02/27/2013. MRI of the pelvis 09/23/2013 and 06/26/2013.  FINDINGS: Large decubitus ulcers are again identified. Ulcer about the posterior aspect of the right hip is seen with an air and fluid collection off of the lateral margin of the right ilium on the right gluteal musculature measuring 7.6 cm transverse by 2.6 cm AP by 4.5 cm craniocaudal. The collection is highly suspicious for abscess and appears to have a thin band of communication to the skin surface. Inferior to the right hip, a second fluid collection measuring 2.7  cm craniocaudal by 5.0 cm AP x  3.5 cm transverse which also demonstrates marked rim enhancement and is worrisome for abscess. 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 Markedly decreased T1 and T2 signal in the visualized proximal right femurs consistent with chronic osteomyelitis. Small focus of marrow edema in the right ilium is unchanged.  A large sacral decubitus ulcer is identified as on the prior studies. Small focus of edema and enhancement in the posterior aspect of the left ilium adjacent to the sacroiliac joint is unchanged to slightly smaller and compatible with chronic osteomyelitis. No fluid collection is identified.  Decubitus ulcer over the left hip is poorly seen due to artifact. Osteolysis of the left femoral head is unchanged. There is a fluid collection in the left hip just below the roof of the acetabulum measuring 2.5 cm transverse by 1.7 cm craniocaudal by 2.3 cm AP. The collection is not definitely seen on the prior CT scan and is worrisome for abscess.  IMPRESSION: Dominant finding are new fluid collections in the right buttock adjacent to the right ilium and deep to the proximal right femur highly suspicious for abscesses. The collection in the right buttock appears to have a thin communication to the skin surface.  Small fluid collection in the left hip just deep to the roof of the left left acetabulum is not definitely seen on the prior examinations and worrisome for abscess.  Sacral decubitus ulcer without underlying abscess.  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 Chronic osteomyelitis deep to the patient's sacral decubitus ulcer is also identified.   Electronically Signed   By: Inge Rise M.D.   On: 12/25/2013 12:27    Scheduled Meds: . amLODipine  2.5 mg Oral q morning - 10a  . baclofen  20 mg Oral QID  . collagenase   Topical Daily  . docusate sodium  100 mg Oral BID  . [START ON 12/26/2013] enoxaparin (LOVENOX) injection  50 mg Subcutaneous Daily   . famotidine  20 mg Oral BID  . feeding supplement (ENSURE COMPLETE)  237 mL Oral BID BM  . feeding supplement (PRO-STAT SUGAR FREE 64)  30 mL Oral TID WC  . fosfomycin  3 g Oral Once  . metoCLOPramide  10 mg Oral TID AC  . multivitamin with minerals  1 tablet Oral q morning - 10a  . potassium chloride SA  20 mEq Oral BID  . sodium chloride  250 mL Intravenous Once  . vitamin C  500 mg Oral q morning - 10a  . zinc sulfate  220 mg Oral q morning - 10a   Continuous Infusions: . sodium chloride 125 mL/hr at 12/22/13 0024  . sodium chloride 75 mL/hr at 12/25/13 0330      Time spent: 25 minutes    Louellen Molder  [facility] Pager [phone] If 7PM-7AM, please contact night-coverage at [email or web address], password [facility] 12/25/2013, 5:46 PM  LOS: 4 days

## 2013-12-25 NOTE — Progress Notes (Signed)
RN placed patient on CPAP. RT verified setting of 19 cmH2O. Sterile water added to water chamber for humidification. Patient was tolerating well.

## 2013-12-25 NOTE — Consult Note (Signed)
I agree and will plan for OR for bone biopsy and debridement.

## 2013-12-26 DIAGNOSIS — R532 Functional quadriplegia: Secondary | ICD-10-CM

## 2013-12-26 DIAGNOSIS — Z9981 Dependence on supplemental oxygen: Secondary | ICD-10-CM

## 2013-12-26 LAB — CBC
HCT: 24.3 % — ABNORMAL LOW (ref 39.0–52.0)
Hemoglobin: 7.3 g/dL — ABNORMAL LOW (ref 13.0–17.0)
MCH: 21.8 pg — ABNORMAL LOW (ref 26.0–34.0)
MCHC: 30 g/dL (ref 30.0–36.0)
MCV: 72.5 fL — ABNORMAL LOW (ref 78.0–100.0)
Platelets: 428 10*3/uL — ABNORMAL HIGH (ref 150–400)
RBC: 3.35 MIL/uL — AB (ref 4.22–5.81)
RDW: 18.5 % — ABNORMAL HIGH (ref 11.5–15.5)
WBC: 10.1 10*3/uL (ref 4.0–10.5)

## 2013-12-26 LAB — URINE CULTURE

## 2013-12-26 NOTE — Progress Notes (Signed)
[facility] PROGRESS NOTE  Jd Mccaster DXI:[id number] DOB: 20-Jul-1965 DOA: 12/21/2013 PCP: [name], MD   Brief narrative 48 year old with C5 cord ectasia, nonhealing sacral wound stage IV, the current osteoarthritis, seizure disorder, anemia, was on CPAP history of suprapubic catheter along with the wording colostomy was admitted on 12/22/13 with sepsis secondary to possible pyelonephritis versus new pelvic abscess.    Assessment/Plan: Sepsis  possibly secondary to new pelvic abscesses as seen on MRI vs UTI. MRI pelvis done today shows new fluid collection adjacent to right ilium and deep to proximal right femur highly suspicious for new abscess Appreciate ID and plastic sx recommendation. Plastic surgery Plan on OR for debridement. (SPOKE WITH dR Sanger  today) dced zosyn ( after 5 days to improve deep tissue cx yield)  UTI  cx growing GNR. Given a dose of fosfomycin on 8/12. Dced zosyn   OSA Cont bedtime CPAP  Microcytic anemia Hb of 7.3. Monitor in am  Paraplegia  Stable. Has deep sacral decub   DVT prophylaxis   Code Status: full Family Communication:none Disposition Plan: currently inpt   Consultants:  Plastic sx  ID  Procedures:  MRI on 8/12  Antibiotics: IV zosyn ( 8/8-8/12)   HPI/Subjective: Patient Denies any  New complain  Objective: Filed Vitals:   12/26/13 0520  BP: 126/76  Pulse: 78  Temp: 98.1 F (36.7 C)  Resp: 16    Intake/Output Summary (Last 24 hours) at 12/26/13 1411 Last data filed at 12/26/13 1200  Gross per 24 hour  Intake 3307.5 ml  Output   6975 ml  Net -3667.5 ml   Filed Weights   12/22/13 0147  Weight: 102.513 kg (226 lb)    Exam:   General:  NAD  HEENT: moist mucosa  Cardiovascular: NS1&S2  Respiratory: clear b/l  Abdomen: SOFT, NT, ND, 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 and suprapubic catheter  Musculoskeletal: quadreplegic  Data Reviewed: Basic Metabolic Panel:  Recent Labs Lab 12/21/13 2335  12/22/13 0730 12/23/13 0805 12/25/13 0500  NA 138 138 137 141  K 3.9 5.4* 3.9 4.0  CL 101 102 100 107  CO2 24 23 25 25   GLUCOSE 114* 93 105* 96  BUN 24* 20 13 10   CREATININE 0.45* 0.44* 0.54 0.47*  CALCIUM 9.5 9.1 8.6 8.8   Liver Function Tests: No results found for this basename: AST, ALT, ALKPHOS, BILITOT, PROT, ALBUMIN,  in the last 168 hours No results found for this basename: LIPASE, AMYLASE,  in the last 168 hours No results found for this basename: AMMONIA,  in the last 168 hours CBC:  Recent Labs Lab 12/21/13 2335 12/22/13 0843 12/23/13 0805 12/24/13 1400 12/25/13 0500 12/26/13 0500  WBC 12.6* 12.3* 11.8* 10.1 9.9 10.1  NEUTROABS 9.0*  --  8.4* 7.1  --   --   HGB 8.4* 8.2* 7.1* 7.3* 7.1* 7.3*  HCT 27.7* 26.9* 23.3* 24.3* 23.9* 24.3*  MCV 72.3* 71.9* 72.6* 73.0* 73.5* 72.5*  PLT 502* 409* 379 377 407* 428*   Cardiac Enzymes: No results found for this basename: CKTOTAL, CKMB, CKMBINDEX, TROPONINI,  in the last 168 hours BNP (last 3 results)  Recent Labs  07/19/13 1938  PROBNP 79.5   CBG: No results found for this basename: GLUCAP,  in the last 168 hours  Recent Results (from the past 240 hour(s))  CULTURE, BLOOD (ROUTINE X 2)     Status: None   Collection Time    12/21/13 11:35 PM      Result Value Ref Range Status  Specimen Description BLOOD LEFT HAND   Final   Special Requests BOTTLES DRAWN AEROBIC ONLY 5CC   Final   Culture  Setup Time     Final   Value: 12/22/2013 13:02     Performed at [facility]   Culture     Final   Value:        BLOOD CULTURE RECEIVED NO GROWTH TO DATE CULTURE WILL BE HELD FOR 5 DAYS BEFORE ISSUING A FINAL NEGATIVE REPORT     Performed at [facility]   Report Status PENDING   Incomplete  CULTURE, BLOOD (ROUTINE X 2)     Status: None   Collection Time    12/21/13 11:40 PM      Result Value Ref Range Status   Specimen Description BLOOD LEFT HAND   Final   Special Requests BOTTLES DRAWN AEROBIC ONLY 5CC    Final   Culture  Setup Time     Final   Value: 12/22/2013 13:02     Performed at [facility]   Culture     Final   Value:        BLOOD CULTURE RECEIVED NO GROWTH TO DATE CULTURE WILL BE HELD FOR 5 DAYS BEFORE ISSUING A FINAL NEGATIVE REPORT     Performed at [facility]   Report Status PENDING   Incomplete  CULTURE, BLOOD (ROUTINE X 2)     Status: None   Collection Time    12/22/13  6:30 PM      Result Value Ref Range Status   Specimen Description BLOOD RIGHT LINE  5 ML IN [facility] BOTTLE   Final   Special Requests Immunocompromised   Final   Culture  Setup Time     Final   Value: 12/23/2013 00:06     Performed at [facility]   Culture     Final   Value:        BLOOD CULTURE RECEIVED NO GROWTH TO DATE CULTURE WILL BE HELD FOR 5 DAYS BEFORE ISSUING A FINAL NEGATIVE REPORT     Performed at [facility]   Report Status PENDING   Incomplete  URINE CULTURE     Status: None   Collection Time    12/23/13  6:21 PM      Result Value Ref Range Status   Specimen Description URINE, CATHETERIZED   Final   Special Requests Immunocompromised   Final   Culture  Setup Time     Final   Value: 12/24/2013 00:28     Performed at [facility] Count     Final   Value: 25,000 COLONIES/ML     Performed at [facility]   Culture     Final   Value: GRAM NEGATIVE RODS     Performed at [facility]   Report Status PENDING   Incomplete     Studies: Mr Pelvis W Wo Contrast  12/25/2013   CLINICAL DATA:  Quadriplegic patient with nonhealing decubitus ulcers.  EXAM: MRI PELVIS WITHOUT AND WITH CONTRAST  TECHNIQUE: Multiplanar multisequence MR imaging of the pelvis was performed both before and after administration of intravenous contrast.  CONTRAST:  20 mL MULTIHANCE GADOBENATE DIMEGLUMINE 529 MG/ML IV SOLN  COMPARISON:  CT pelvis 02/27/2013. MRI of the pelvis 09/23/2013 and 06/26/2013.  FINDINGS: Large decubitus ulcers are again identified. Ulcer  about the posterior aspect of the right hip is seen with an air and fluid collection off  of the lateral margin of the right ilium on the right gluteal musculature measuring 7.6 cm transverse by 2.6 cm AP by 4.5 cm craniocaudal. The collection is highly suspicious for abscess and appears to have a thin band of communication to the skin surface. Inferior to the right hip, a second fluid collection measuring 2.7 cm craniocaudal by 5.0 cm AP x 3.5 cm transverse which also demonstrates marked rim enhancement and is worrisome for abscess. 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 Markedly decreased T1 and T2 signal in the visualized proximal right femurs consistent with chronic osteomyelitis. Small focus of marrow edema in the right ilium is unchanged.  A large sacral decubitus ulcer is identified as on the prior studies. Small focus of edema and enhancement in the posterior aspect of the left ilium adjacent to the sacroiliac joint is unchanged to slightly smaller and compatible with chronic osteomyelitis. No fluid collection is identified.  Decubitus ulcer over the left hip is poorly seen due to artifact. Osteolysis of the left femoral head is unchanged. There is a fluid collection in the left hip just below the roof of the acetabulum measuring 2.5 cm transverse by 1.7 cm craniocaudal by 2.3 cm AP. The collection is not definitely seen on the prior CT scan and is worrisome for abscess.  IMPRESSION: Dominant finding are new fluid collections in the right buttock adjacent to the right ilium and deep to the proximal right femur highly suspicious for abscesses. The collection in the right buttock appears to have a thin communication to the skin surface.  Small fluid collection in the left hip just deep to the roof of the left left acetabulum is not definitely seen on the prior examinations and worrisome for abscess.  Sacral decubitus ulcer without underlying abscess.  Osteolysis and chronic osteomyelitis of both  hips. Chronic osteomyelitis deep to the patient's sacral decubitus ulcer is also identified.   Electronically Signed   By: Inge Rise M.D.   On: 12/25/2013 12:27    Scheduled Meds: . amLODipine  2.5 mg Oral q morning - 10a  . baclofen  20 mg Oral QID  . collagenase   Topical Daily  . docusate sodium  100 mg Oral BID  . enoxaparin (LOVENOX) injection  50 mg Subcutaneous Daily  . famotidine  20 mg Oral BID  . feeding supplement (ENSURE COMPLETE)  237 mL Oral BID BM  . feeding supplement (PRO-STAT SUGAR FREE 64)  30 mL Oral TID WC  . metoCLOPramide  10 mg Oral TID AC  . multivitamin with minerals  1 tablet Oral q morning - 10a  . potassium chloride SA  20 mEq Oral BID  . sodium chloride  250 mL Intravenous Once  . vitamin C  500 mg Oral q morning - 10a  . zinc sulfate  220 mg Oral q morning - 10a   Continuous Infusions: . sodium chloride 125 mL/hr at 12/22/13 0024  . sodium chloride 75 mL/hr at 12/25/13 2142      Time spent: 25 minutes    Louellen Molder  [facility] Pager [phone] If 7PM-7AM, please contact night-coverage at [email or web address], password [facility] 12/26/2013, 2:11 PM  LOS: 5 days

## 2013-12-26 NOTE — Progress Notes (Signed)
[name] for Infectious Disease  #2 fosfomycin  RECEIVED 5 DAYS Zosyn he also received a dose of vancomycin  Subjective: [name]   Antibiotics:  Anti-infectives   Start     Dose/Rate Route Frequency Ordered Stop   12/22/13 1400  vancomycin (VANCOCIN) 1,250 mg in sodium chloride 0.9 % 250 mL IVPB  Status:  Discontinued     1,250 mg 166.7 mL/hr over 90 Minutes Intravenous Every 12 hours 12/22/13 0201 12/22/13 1132   12/22/13 0800  piperacillin-tazobactam (ZOSYN) IVPB 3.375 g  Status:  Discontinued     3.375 g 12.5 mL/hr over 240 Minutes Intravenous Every 8 hours 12/22/13 0146 12/25/13 1628   12/22/13 0600  piperacillin-tazobactam (ZOSYN) IVPB 3.375 g  Status:  Discontinued     3.375 g 100 mL/hr over 30 Minutes Intravenous 3 times per day 12/22/13 0129 12/22/13 0146   12/22/13 0145  vancomycin (VANCOCIN) 2,500 mg in sodium chloride 0.9 % 500 mL IVPB     2,500 mg 250 mL/hr over 120 Minutes Intravenous  Once 12/22/13 0135 12/22/13 0428   12/21/13 2230  piperacillin-tazobactam (ZOSYN) IVPB 3.375 g     3.375 g 100 mL/hr over 30 Minutes Intravenous  Once 12/21/13 2216 12/22/13 1216   12/21/13 2215  vancomycin (VANCOCIN) IVPB 1000 mg/200 mL premix  Status:  Discontinued     1,000 mg 200 mL/hr over 60 Minutes Intravenous  Once 12/21/13 2211 12/22/13 0133      Medications: Scheduled Meds: . amLODipine  2.5 mg Oral q morning - 10a  . baclofen  20 mg Oral QID  . collagenase   Topical Daily  . docusate sodium  100 mg Oral BID  . enoxaparin (LOVENOX) injection  50 mg Subcutaneous Daily  . famotidine  20 mg Oral BID  . feeding supplement (ENSURE COMPLETE)  237 mL Oral BID BM  . feeding supplement (PRO-STAT SUGAR FREE 64)  30 mL Oral TID WC  . metoCLOPramide  10 mg Oral TID AC  . multivitamin with minerals  1 tablet Oral q morning - 10a  . potassium chloride SA  20 mEq Oral BID  . sodium chloride  250 mL Intravenous Once  . vitamin C  500 mg Oral q morning - 10a    . zinc sulfate  220 mg Oral q morning - 10a   Continuous Infusions: . sodium chloride 125 mL/hr at 12/22/13 0024  . sodium chloride 75 mL/hr at 12/25/13 2142   PRN Meds:.acetaminophen, acetaminophen, alum & mag hydroxide-simeth, HYDROmorphone (DILAUDID) injection, ondansetron (ZOFRAN) IV, ondansetron, oxyCODONE, sodium chloride    Objective: Weight change:   Intake/Output Summary (Last 24 hours) at 12/26/13 1332 Last data filed at 12/26/13 1200  Gross per 24 hour  Intake 3307.5 ml  Output   6975 ml  Net -3667.5 ml   Blood pressure 126/76, pulse 78, temperature 98.1 F (36.7 C), temperature source Oral, resp. rate 16, height 6' (1.829 m), weight 226 lb (102.513 kg), SpO2 96.00%. Temp:  [98.1 F (36.7 C)-98.4 F (36.9 C)] 98.1 F (36.7 C) (08/13 0520) Pulse Rate:  [78-86] 78 (08/13 0520) Resp:  [16] 16 (08/13 0520) BP: (125-134)/(76-80) 126/76 mmHg (08/13 0520) SpO2:  [96 %-98 %] 96 % (08/13 0520)  Physical Exam: General: Alert and awake, oriented x3, not in any acute distress. 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 with tunnelling tqaken 3days ago    Left side:tunnel 3days ago pic   Ulcer entered the patient's scrotum 3 days ago pic    Not well-visualized in this picture but at the bottom of the picture is that of deep fistula that is packed with "shoe string " packing3 days ago pic     Upper back 3 days ago pic      Neuro: Quadriplegic  CBC:  Recent Labs Lab 12/22/13 0843 12/23/13 0805 12/24/13 1400 12/25/13 0500 12/26/13 0500  HGB 8.2* 7.1* 7.3* 7.1* 7.3*  HCT 26.9* 23.3* 24.3* 23.9* 24.3*  PLT 409* 379 377 407* 428*     BMET  Recent Labs  12/25/13 0500  NA 141  K 4.0  CL 107  CO2 25  GLUCOSE 96  BUN 10   CREATININE 0.47*  CALCIUM 8.8     Liver Panel  No results found for this basename: PROT, ALBUMIN, AST, ALT, ALKPHOS, BILITOT, BILIDIR, IBILI,  in the last 72 hours     Sedimentation Rate No results found for this basename: ESRSEDRATE,  in the last 72 hours C-Reactive Protein No results found for this basename: CRP,  in the last 72 hours  Micro Results: Recent Results (from the past 720 hour(s))  CULTURE, BLOOD (ROUTINE X 2)     Status: None   Collection Time    12/21/13 11:35 PM      Result Value Ref Range Status   Specimen Description BLOOD LEFT HAND   Final   Special Requests BOTTLES DRAWN AEROBIC ONLY 5CC   Final   Culture  Setup Time     Final   Value: 12/22/2013 13:02     Performed at [facility]   Culture     Final   Value:        BLOOD CULTURE RECEIVED NO GROWTH TO DATE CULTURE WILL BE HELD FOR 5 DAYS BEFORE ISSUING A FINAL NEGATIVE REPORT     Performed at [facility]   Report Status PENDING   Incomplete  CULTURE, BLOOD (ROUTINE X 2)     Status: None   Collection Time    12/21/13 11:40 PM      Result Value Ref Range Status   Specimen Description BLOOD LEFT HAND   Final   Special Requests BOTTLES DRAWN AEROBIC ONLY 5CC   Final   Culture  Setup Time     Final   Value: 12/22/2013 13:02     Performed at [facility]   Culture     Final   Value:        BLOOD CULTURE RECEIVED NO GROWTH TO DATE CULTURE WILL BE HELD FOR 5 DAYS BEFORE ISSUING A FINAL NEGATIVE REPORT     Performed at [facility]   Report Status PENDING   Incomplete  CULTURE, BLOOD (ROUTINE X 2)     Status: None   Collection Time    12/22/13  6:30 PM      Result Value Ref Range Status   Specimen Description BLOOD RIGHT LINE  5 ML IN [facility] BOTTLE   Final   Special Requests Immunocompromised   Final   Culture  Setup Time     Final   Value: 12/23/2013 00:06     Performed at [facility]   Culture     Final   Value:        BLOOD CULTURE RECEIVED NO GROWTH TO  DATE CULTURE WILL  BE HELD FOR 5 DAYS BEFORE ISSUING A FINAL NEGATIVE REPORT     Performed at [facility]   Report Status PENDING   Incomplete  URINE CULTURE     Status: None   Collection Time    12/23/13  6:21 PM      Result Value Ref Range Status   Specimen Description URINE, CATHETERIZED   Final   Special Requests Immunocompromised   Final   Culture  Setup Time     Final   Value: 12/24/2013 00:28     Performed at [place]     Final   Value: 25,000 COLONIES/ML     Performed at [facility]   Culture     Final   Value: GRAM NEGATIVE RODS     Performed at [facility]   Report Status PENDING   Incomplete    Studies/Results: [name] [name]  12/25/2013   CLINICAL DATA:  Quadriplegic patient with nonhealing decubitus ulcers.  EXAM: MRI PELVIS WITHOUT AND WITH [name]  TECHNIQUE: Multiplanar multisequence [name] imaging of the pelvis was performed both before and after administration of intravenous [name].  [name]:  20 mL MULTIHANCE GADOBENATE DIMEGLUMINE 529 MG/ML IV SOLN  COMPARISON:  CT pelvis 02/27/2013. MRI of the pelvis 09/23/2013 and 06/26/2013.  FINDINGS: Large decubitus ulcers are again identified. Ulcer about the posterior aspect of the right hip is seen with an air and fluid collection off of the lateral margin of the right ilium on the right gluteal musculature measuring 7.6 cm transverse by 2.6 cm AP by 4.5 cm craniocaudal. The collection is highly suspicious for abscess and appears to have a thin band of communication to the skin surface. Inferior to the right hip, a second fluid collection measuring 2.7 cm craniocaudal by 5.0 cm AP x 3.5 cm transverse which also demonstrates marked rim enhancement and is worrisome for abscess. 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 Markedly decreased T1 and T2 signal in the visualized proximal right femurs consistent with chronic osteomyelitis. Small focus of marrow edema in  the right ilium is unchanged.  A large sacral decubitus ulcer is identified as on the prior studies. Small focus of edema and enhancement in the posterior aspect of the left ilium adjacent to the sacroiliac joint is unchanged to slightly smaller and compatible with chronic osteomyelitis. No fluid collection is identified.  Decubitus ulcer over the left hip is poorly seen due to artifact. Osteolysis of the left femoral head is unchanged. There is a fluid collection in the left hip just below the roof of the acetabulum measuring 2.5 cm transverse by 1.7 cm craniocaudal by 2.3 cm AP. The collection is not definitely seen on the prior CT scan and is worrisome for abscess.  IMPRESSION: Dominant finding are new fluid collections in the right buttock adjacent to the right ilium and deep to the proximal right femur highly suspicious for abscesses. The collection in the right buttock appears to have a thin communication to the skin surface.  Small fluid collection in the left hip just deep to the roof of the left left acetabulum is not definitely seen on the prior examinations and worrisome for abscess.  Sacral decubitus ulcer without underlying abscess.  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 Chronic osteomyelitis deep to the patient's sacral decubitus ulcer is also identified.   Electronically Signed   By: Inge Rise M.D.   On: 12/25/2013 12:27  Assessment/Plan:  Principal Problem:   Infected decubitus ulcer Active Problems:   Quadriplegia   Seizure disorder   HTN (hypertension)   OSA on CPAP   Anemia   UTI (lower urinary tract infection)   Wound infection   Decubitus ulcer of ischium, stage 3   Decubitus ulcer of sacral region, stage 3   Decubitus ulcer of back, stage 3   Decubitus ulcer of lower extremity, stage 2    [name] [name] is a 48 y.o. male with with Quadraplegia, pelvic osteomyelitis, mx decubitus ulcers with recent worsening drainage from ulcers last week and now foul  smelling urine   #1 Decubitus ulcers imaged with MRI and now pt found to have new abscesses on right near femur and ilium--likely what had previously suddenly started draining. Chronic osteomyelitis present:  --Greatly appreciate Dr. [name] seeing the patient. --He will need I and D of abscesses that are new--> pus for culture and consideration of bone biopsy for culture as well --I  dc'd the zosyn to reduce systemic antibiotics in his body prior to getting deep cultures  #2 UTI: his repeat culture on abx growing GNR. He got 5 days of of zosyn. I gave one dose of fosfomycin which will only act at level of he bladder but givre him 3 more days of therapy.       LOS: 5 days   Alcide Evener 12/26/2013, 1:32 PM

## 2013-12-27 ENCOUNTER — Other Ambulatory Visit: Payer: Self-pay | Admitting: Plastic Surgery

## 2013-12-27 DIAGNOSIS — L89324 Pressure ulcer of left buttock, stage 4: Secondary | ICD-10-CM

## 2013-12-27 MED ORDER — CEFAZOLIN SODIUM-DEXTROSE 2-3 GM-% IV SOLR: 2.0000 g | INTRAVENOUS | Status: AC

## 2013-12-27 MED ORDER — FOSFOMYCIN TROMETHAMINE 3 G PO PACK
3.0000 g | PACK | Freq: Once | ORAL | Status: AC
  Administered 2013-12-28: 3 g via ORAL
  Filled 2013-12-27: qty 3

## 2013-12-27 NOTE — Progress Notes (Signed)
[facility]ious [facility]  #3 fosfomycin  RECEIVED 5 DAYS Zosyn he also received a dose of vancomycin  Subjective: [name]   Antibiotics:  Anti-infectives   Start     Dose/Rate Route Frequency Ordered Stop   12/22/13 1400  vancomycin (VANCOCIN) 1,250 mg in sodium chloride 0.9 % 250 mL IVPB  Status:  Discontinued     1,250 mg 166.7 mL/hr over 90 Minutes Intravenous Every 12 hours 12/22/13 0201 12/22/13 1132   12/22/13 0800  piperacillin-tazobactam (ZOSYN) IVPB 3.375 g  Status:  Discontinued     3.375 g 12.5 mL/hr over 240 Minutes Intravenous Every 8 hours 12/22/13 0146 12/25/13 1628   12/22/13 0600  piperacillin-tazobactam (ZOSYN) IVPB 3.375 g  Status:  Discontinued     3.375 g 100 mL/hr over 30 Minutes Intravenous 3 times per day 12/22/13 0129 12/22/13 0146   12/22/13 0145  vancomycin (VANCOCIN) 2,500 mg in sodium chloride 0.9 % 500 mL IVPB     2,500 mg 250 mL/hr over 120 Minutes Intravenous  Once 12/22/13 0135 12/22/13 0428   12/21/13 2230  piperacillin-tazobactam (ZOSYN) IVPB 3.375 g     3.375 g 100 mL/hr over 30 Minutes Intravenous  Once 12/21/13 2216 12/22/13 1216   12/21/13 2215  vancomycin (VANCOCIN) IVPB 1000 mg/200 mL premix  Status:  Discontinued     1,000 mg 200 mL/hr over 60 Minutes Intravenous  Once 12/21/13 2211 12/22/13 0133      Medications: Scheduled Meds: . amLODipine  2.5 mg Oral q morning - 10a  . baclofen  20 mg Oral QID  . collagenase   Topical Daily  . docusate sodium  100 mg Oral BID  . enoxaparin (LOVENOX) injection  50 mg Subcutaneous Daily  . famotidine  20 mg Oral BID  . feeding supplement (ENSURE COMPLETE)  237 mL Oral BID BM  . feeding supplement (PRO-STAT SUGAR FREE 64)  30 mL Oral TID WC  . metoCLOPramide  10 mg Oral TID AC  . multivitamin with minerals  1 tablet Oral q morning - 10a  . potassium chloride SA  20 mEq Oral BID  . sodium chloride  250 mL Intravenous Once  . vitamin C  500 mg Oral q morning - 10a    . zinc sulfate  220 mg Oral q morning - 10a   Continuous Infusions: . sodium chloride 125 mL/hr at 12/22/13 0024  . sodium chloride 75 mL/hr at 12/25/13 2142   PRN Meds:.acetaminophen, acetaminophen, alum & mag hydroxide-simeth, HYDROmorphone (DILAUDID) injection, ondansetron (ZOFRAN) IV, ondansetron, oxyCODONE, sodium chloride    Objective: Weight change:   Intake/Output Summary (Last 24 hours) at 12/27/13 1436 Last data filed at 12/27/13 0830  Gross per 24 hour  Intake    720 ml  Output   3300 ml  Net  -2580 ml   Blood pressure 124/77, pulse 86, temperature 98.4 F (36.9 C), temperature source Oral, resp. rate 16, height 6' (1.829 m), weight 226 lb (102.513 kg), SpO2 98.00%. Temp:  [98.4 F (36.9 C)-98.7 F (37.1 C)] 98.4 F (36.9 C) (08/14 0505) Pulse Rate:  [77-87] 86 (08/14 0505) Resp:  [16-18] 16 (08/14 0505) BP: (124-142)/(72-77) 124/77 mmHg (08/14 0505) SpO2:  [97 %-99 %] 98 % (08/14 0505)  Physical Exam: General: Alert and awake, oriented x3, not in any acute distress. 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 with tunnelling tqaken 3days ago    Left side:tunnel 3days ago pic   Ulcer entered the patient's scrotum 3 days ago pic    Not well-visualized in this picture but at the bottom of the picture is that of deep fistula that is packed with "shoe string " packing3 days ago pic     Upper back 3 days ago pic      Neuro: Quadriplegic  CBC:  Recent Labs Lab 12/22/13 0843 12/23/13 0805 12/24/13 1400 12/25/13 0500 12/26/13 0500  HGB 8.2* 7.1* 7.3* 7.1* 7.3*  HCT 26.9* 23.3* 24.3* 23.9* 24.3*  PLT 409* 379 377 407* 428*     BMET  Recent Labs  12/25/13 0500  NA 141  K 4.0  CL 107  CO2 25  GLUCOSE 96  BUN 10   CREATININE 0.47*  CALCIUM 8.8     Liver Panel  No results found for this basename: PROT, ALBUMIN, AST, ALT, ALKPHOS, BILITOT, BILIDIR, IBILI,  in the last 72 hours     Sedimentation Rate No results found for this basename: ESRSEDRATE,  in the last 72 hours C-Reactive Protein No results found for this basename: CRP,  in the last 72 hours  Micro Results: Recent Results (from the past 720 hour(s))  CULTURE, BLOOD (ROUTINE X 2)     Status: None   Collection Time    12/21/13 11:35 PM      Result Value Ref Range Status   Specimen Description BLOOD LEFT HAND   Final   Special Requests BOTTLES DRAWN AEROBIC ONLY 5CC   Final   Culture  Setup Time     Final   Value: 12/22/2013 13:02     Performed at [facility]   Culture     Final   Value:        BLOOD CULTURE RECEIVED NO GROWTH TO DATE CULTURE WILL BE HELD FOR 5 DAYS BEFORE ISSUING A FINAL NEGATIVE REPORT     Performed at [facility]   Report Status PENDING   Incomplete  CULTURE, BLOOD (ROUTINE X 2)     Status: None   Collection Time    12/21/13 11:40 PM      Result Value Ref Range Status   Specimen Description BLOOD LEFT HAND   Final   Special Requests BOTTLES DRAWN AEROBIC ONLY 5CC   Final   Culture  Setup Time     Final   Value: 12/22/2013 13:02     Performed at [facility]   Culture     Final   Value:        BLOOD CULTURE RECEIVED NO GROWTH TO DATE CULTURE WILL BE HELD FOR 5 DAYS BEFORE ISSUING A FINAL NEGATIVE REPORT     Performed at [facility]   Report Status PENDING   Incomplete  CULTURE, BLOOD (ROUTINE X 2)     Status: None   Collection Time    12/22/13  6:30 PM      Result Value Ref Range Status   Specimen Description BLOOD RIGHT LINE  5 ML IN [facility] BOTTLE   Final   Special Requests Immunocompromised   Final   Culture  Setup Time     Final   Value: 12/23/2013 00:06     Performed at [facility]   Culture     Final   Value:        BLOOD CULTURE RECEIVED NO GROWTH TO  DATE CULTURE WILL BE HELD FOR 5 DAYS BEFORE ISSUING A FINAL NEGATIVE REPORT     Performed at [facility]   Report Status PENDING   Incomplete  URINE CULTURE     Status: None   Collection Time    12/23/13  6:21 PM      Result Value Ref Range Status   Specimen Description URINE, CATHETERIZED   Final   Special Requests Immunocompromised   Final   Culture  Setup Time     Final   Value: 12/24/2013 00:28     Performed at [place]     Final   Value: 25,000 COLONIES/ML     Performed at [facility]   Culture     Final   Value: PSEUDOMONAS AERUGINOSA     Performed at [facility]   Report Status 12/26/2013 FINAL   Final   Organism ID, Bacteria PSEUDOMONAS AERUGINOSA   Final    Studies/Results: No results found.    Assessment/Plan:  Principal Problem:   Infected decubitus ulcer Active Problems:   Quadriplegia   Seizure disorder   HTN (hypertension)   OSA on CPAP   Anemia   UTI (lower urinary tract infection)   Wound infection   Decubitus ulcer of ischium, stage 3   Decubitus ulcer of sacral region, stage 3   Decubitus ulcer of back, stage 3   Decubitus ulcer of lower extremity, stage 2    [name] [name] is a 48 y.o. male with with Quadraplegia, pelvic osteomyelitis, mx decubitus ulcers with recent worsening drainage from ulcers last week and now foul smelling urine   #1 Decubitus ulcers imaged with MRI and now pt found to have new abscesses on right near femur and ilium--likely what had previously suddenly started draining. Chronic osteomyelitis present:  --Greatly appreciate Dr. [name] seeing the patient. --He will need I and D of abscesses that are new--> pus for culture and consideration of bone biopsy for culture as well --I  dc'd the zosyn to reduce systemic antibiotics in his body prior to getting deep cultures  #2 UTI: his repeat culture on abx growing pseudmonas R to zosyn raising ? Of relevance oft his organism  since he has done well without effective abx   I gave one dose of fosfomycin = 3 days, should hold him for now, will likely give him another one tomorrow  Dr. Linus Salmons is covering this weekend.     LOS: 6 days   Alcide Evener 12/27/2013, 2:36 PM

## 2013-12-27 NOTE — Progress Notes (Signed)
[facility] PROGRESS NOTE  [name] [name] FXT:[id number] DOB: 01/09/1966 DOA: 12/21/2013 PCP: [name], MD   Brief narrative 48 year old with C5 quadriplegia,  nonhealing sacral wound stage IV, the current osteoarthritis, seizure disorder, anemia, was on CPAP history of suprapubic catheter along with the wording colostomy was admitted on 12/22/13 with sepsis secondary to possible pyelonephritis versus new pelvic abscess.    Assessment/Plan: Sepsis  possibly secondary to new pelvic abscesses as seen on MRI vs UTI. MRI pelvis done today shows new fluid collection adjacent to right ilium and deep to proximal right femur highly suspicious for new abscess Appreciate ID and plastic sx recommendation. Plastic surgery Plan on OR for debridement. dced zosyn ( after 5 days to improve deep tissue cx yield)   UTI Urine cx growing 25k pseudomonas resistant to zosyn. Will follow ID recommendations. . Given a dose of fosfomycin on 8/12. Dced zosyn   OSA Cont bedtime CPAP  Microcytic anemia Hb of 7.3. Monitor in am  Paraplegia  Stable. Has deep sacral decub   DVT prophylaxis   Code Status: full Family Communication:none Disposition Plan: currently inpt   Consultants:  Plastic sx  ID  Procedures:  MRI on 8/12  Antibiotics: IV zosyn ( 8/8-8/12)   HPI/Subjective: No overnight issues  Objective: Filed Vitals:   12/27/13 0505  BP: 124/77  Pulse: 86  Temp: 98.4 F (36.9 C)  Resp: 16    Intake/Output Summary (Last 24 hours) at 12/27/13 1243 Last data filed at 12/27/13 0830  Gross per 24 hour  Intake    960 ml  Output   3300 ml  Net  -2340 ml   Filed Weights   12/22/13 0147  Weight: 102.513 kg (226 lb)    Exam:   General:  NAD  HEENT: moist mucosa  Cardiovascular: NS1&S2  Respiratory: clear b/l  Abdomen: SOFT, NT, ND, 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 and suprapubic catheter  Musculoskeletal: quadriplegic, decrease discharge from decubitus  Data  Reviewed: Basic Metabolic Panel:  Recent Labs Lab 12/21/13 2335 12/22/13 0730 12/23/13 0805 12/25/13 0500  NA 138 138 137 141  K 3.9 5.4* 3.9 4.0  CL 101 102 100 107  CO2 24 23 25 25   GLUCOSE 114* 93 105* 96  BUN 24* 20 13 10   CREATININE 0.45* 0.44* 0.54 0.47*  CALCIUM 9.5 9.1 8.6 8.8   Liver Function Tests: No results found for this basename: AST, ALT, ALKPHOS, BILITOT, PROT, ALBUMIN,  in the last 168 hours No results found for this basename: LIPASE, AMYLASE,  in the last 168 hours No results found for this basename: AMMONIA,  in the last 168 hours CBC:  Recent Labs Lab 12/21/13 2335 12/22/13 0843 12/23/13 0805 12/24/13 1400 12/25/13 0500 12/26/13 0500  WBC 12.6* 12.3* 11.8* 10.1 9.9 10.1  NEUTROABS 9.0*  --  8.4* 7.1  --   --   HGB 8.4* 8.2* 7.1* 7.3* 7.1* 7.3*  HCT 27.7* 26.9* 23.3* 24.3* 23.9* 24.3*  MCV 72.3* 71.9* 72.6* 73.0* 73.5* 72.5*  PLT 502* 409* 379 377 407* 428*   Cardiac Enzymes: No results found for this basename: CKTOTAL, CKMB, CKMBINDEX, TROPONINI,  in the last 168 hours BNP (last 3 results)  Recent Labs  07/19/13 1938  PROBNP 79.5   CBG: No results found for this basename: GLUCAP,  in the last 168 hours  Recent Results (from the past 240 hour(s))  CULTURE, BLOOD (ROUTINE X 2)     Status: None   Collection Time    12/21/13 11:35 PM  Result Value Ref Range Status   Specimen Description BLOOD LEFT HAND   Final   Special Requests BOTTLES DRAWN AEROBIC ONLY 5CC   Final   Culture  Setup Time     Final   Value: 12/22/2013 13:02     Performed at [facility]   Culture     Final   Value:        BLOOD CULTURE RECEIVED NO GROWTH TO DATE CULTURE WILL BE HELD FOR 5 DAYS BEFORE ISSUING A FINAL NEGATIVE REPORT     Performed at [facility]   Report Status PENDING   Incomplete  CULTURE, BLOOD (ROUTINE X 2)     Status: None   Collection Time    12/21/13 11:40 PM      Result Value Ref Range Status   Specimen Description BLOOD  LEFT HAND   Final   Special Requests BOTTLES DRAWN AEROBIC ONLY 5CC   Final   Culture  Setup Time     Final   Value: 12/22/2013 13:02     Performed at [facility]   Culture     Final   Value:        BLOOD CULTURE RECEIVED NO GROWTH TO DATE CULTURE WILL BE HELD FOR 5 DAYS BEFORE ISSUING A FINAL NEGATIVE REPORT     Performed at [facility]   Report Status PENDING   Incomplete  CULTURE, BLOOD (ROUTINE X 2)     Status: None   Collection Time    12/22/13  6:30 PM      Result Value Ref Range Status   Specimen Description BLOOD RIGHT LINE  5 ML IN [facility] BOTTLE   Final   Special Requests Immunocompromised   Final   Culture  Setup Time     Final   Value: 12/23/2013 00:06     Performed at [facility]   Culture     Final   Value:        BLOOD CULTURE RECEIVED NO GROWTH TO DATE CULTURE WILL BE HELD FOR 5 DAYS BEFORE ISSUING A FINAL NEGATIVE REPORT     Performed at [facility]   Report Status PENDING   Incomplete  URINE CULTURE     Status: None   Collection Time    12/23/13  6:21 PM      Result Value Ref Range Status   Specimen Description URINE, CATHETERIZED   Final   Special Requests Immunocompromised   Final   Culture  Setup Time     Final   Value: 12/24/2013 00:28     Performed at [place]     Final   Value: 25,000 COLONIES/ML     Performed at [facility]   Culture     Final   Value: PSEUDOMONAS AERUGINOSA     Performed at [facility]   Report Status 12/26/2013 FINAL   Final   Organism ID, Bacteria PSEUDOMONAS AERUGINOSA   Final     Studies: No results found.  Scheduled Meds: . amLODipine  2.5 mg Oral q morning - 10a  . baclofen  20 mg Oral QID  . collagenase   Topical Daily  . docusate sodium  100 mg Oral BID  . enoxaparin (LOVENOX) injection  50 mg Subcutaneous Daily  . famotidine  20 mg Oral BID  . feeding supplement (ENSURE COMPLETE)  237 mL Oral BID BM  . feeding supplement (PRO-STAT SUGAR  FREE 64)  30 mL Oral TID WC  . metoCLOPramide  10 mg Oral TID AC  . multivitamin with minerals  1 tablet Oral q morning - 10a  . potassium chloride SA  20 mEq Oral BID  . sodium chloride  250 mL Intravenous Once  . vitamin C  500 mg Oral q morning - 10a  . zinc sulfate  220 mg Oral q morning - 10a   Continuous Infusions: . sodium chloride 125 mL/hr at 12/22/13 0024  . sodium chloride 75 mL/hr at 12/25/13 2142      Time spent: 25 minutes    Louellen Molder  [facility] Pager [phone] If 7PM-7AM, please contact night-coverage at [email or web address], password [facility] 12/27/2013, 12:43 PM  LOS: 6 days

## 2013-12-27 NOTE — Progress Notes (Signed)
Pt placed on CPAP set at 19 CMH20 per home settings.  Pt tolerating well at this time, RT to monitor and assess as needed.

## 2013-12-28 LAB — CULTURE, BLOOD (ROUTINE X 2)
Culture: NO GROWTH
Culture: NO GROWTH

## 2013-12-28 LAB — CBC
HEMATOCRIT: 23.8 % — AB (ref 39.0–52.0)
Hemoglobin: 7.2 g/dL — ABNORMAL LOW (ref 13.0–17.0)
MCH: 22.1 pg — ABNORMAL LOW (ref 26.0–34.0)
MCHC: 30.3 g/dL (ref 30.0–36.0)
MCV: 73 fL — ABNORMAL LOW (ref 78.0–100.0)
Platelets: 459 10*3/uL — ABNORMAL HIGH (ref 150–400)
RBC: 3.26 MIL/uL — ABNORMAL LOW (ref 4.22–5.81)
RDW: 18.4 % — ABNORMAL HIGH (ref 11.5–15.5)
WBC: 10.6 10*3/uL — AB (ref 4.0–10.5)

## 2013-12-28 NOTE — Progress Notes (Signed)
[facility] PROGRESS NOTE  [name] [name] HYI:[id number] DOB: 1965-09-10 DOA: 12/21/2013 PCP: [name], MD   Brief narrative 48 year old with C5 quadriplegia,  nonhealing sacral wound stage IV, the current osteoarthritis, seizure disorder, anemia, was on CPAP history of suprapubic catheter along with the wording colostomy was admitted on 12/22/13 with sepsis secondary to possible pyelonephritis versus new pelvic abscess.    Assessment/Plan: Sepsis  possibly secondary to new pelvic abscesses as seen on MRI vs UTI. MRI pelvis done on 8/13 shows new fluid collection adjacent to right ilium and deep to proximal right femur highly suspicious for new abscess Appreciate ID and plastic sx recommendation. Plastic surgery Plan on OR for debridement on 8/17. dced zosyn ( after 5 days to improve deep tissue cx yield)   UTI Urine cx growing 25k pseudomonas resistant to zosyn. Will follow ID recommendations. . Given a dose of fosfomycin on 8/12. off zosyn   OSA Cont bedtime CPAP  Microcytic anemia Hb of 7.2. transfuse if ,7  Paraplegia  Stable. Has deep sacral decub   DVT prophylaxis   Code Status: full Family Communication:none Disposition Plan: currently inpt   Consultants:  Plastic sx  ID  Procedures:  MRI on 8/12  Antibiotics: IV zosyn ( 8/8-8/12)   HPI/Subjective: No overnight issues  Objective: Filed Vitals:   12/28/13 0430  BP: 134/78  Pulse: 81  Temp: 98.6 F (37 C)  Resp: 18    Intake/Output Summary (Last 24 hours) at 12/28/13 1438 Last data filed at 12/28/13 0900  Gross per 24 hour  Intake    720 ml  Output   5950 ml  Net  -5230 ml   Filed Weights   12/22/13 0147  Weight: 102.513 kg (226 lb)    Exam:   General:  NAD  HEENT: moist mucosa  Cardiovascular: NS1&S2  Respiratory: clear b/l  Abdomen: SOFT, NT, ND, 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 and suprapubic catheter  Musculoskeletal: quadriplegic, sacral decubitus  Data Reviewed: Basic  Metabolic Panel:  Recent Labs Lab 12/21/13 2335 12/22/13 0730 12/23/13 0805 12/25/13 0500  NA 138 138 137 141  K 3.9 5.4* 3.9 4.0  CL 101 102 100 107  CO2 24 23 25 25   GLUCOSE 114* 93 105* 96  BUN 24* 20 13 10   CREATININE 0.45* 0.44* 0.54 0.47*  CALCIUM 9.5 9.1 8.6 8.8   Liver Function Tests: No results found for this basename: AST, ALT, ALKPHOS, BILITOT, PROT, ALBUMIN,  in the last 168 hours No results found for this basename: LIPASE, AMYLASE,  in the last 168 hours No results found for this basename: AMMONIA,  in the last 168 hours CBC:  Recent Labs Lab 12/21/13 2335  12/23/13 0805 12/24/13 1400 12/25/13 0500 12/26/13 0500 12/28/13 0500  WBC 12.6*  < > 11.8* 10.1 9.9 10.1 10.6*  NEUTROABS 9.0*  --  8.4* 7.1  --   --   --   HGB 8.4*  < > 7.1* 7.3* 7.1* 7.3* 7.2*  HCT 27.7*  < > 23.3* 24.3* 23.9* 24.3* 23.8*  MCV 72.3*  < > 72.6* 73.0* 73.5* 72.5* 73.0*  PLT 502*  < > 379 377 407* 428* 459*  < > = values in this interval not displayed. Cardiac Enzymes: No results found for this basename: CKTOTAL, CKMB, CKMBINDEX, TROPONINI,  in the last 168 hours BNP (last 3 results)  Recent Labs  07/19/13 1938  PROBNP 79.5   CBG: No results found for this basename: GLUCAP,  in the last 168 hours  Recent Results (  from the past 240 hour(s))  CULTURE, BLOOD (ROUTINE X 2)     Status: None   Collection Time    12/21/13 11:35 PM      Result Value Ref Range Status   Specimen Description BLOOD LEFT HAND   Final   Special Requests BOTTLES DRAWN AEROBIC ONLY 5CC   Final   Culture  Setup Time     Final   Value: 12/22/2013 13:02     Performed at [facility]   Culture     Final   Value: NO GROWTH 5 DAYS     Performed at [facility]   Report Status 12/28/2013 FINAL   Final  CULTURE, BLOOD (ROUTINE X 2)     Status: None   Collection Time    12/21/13 11:40 PM      Result Value Ref Range Status   Specimen Description BLOOD LEFT HAND   Final   Special Requests  BOTTLES DRAWN AEROBIC ONLY 5CC   Final   Culture  Setup Time     Final   Value: 12/22/2013 13:02     Performed at [facility]   Culture     Final   Value: NO GROWTH 5 DAYS     Performed at [facility]   Report Status 12/28/2013 FINAL   Final  CULTURE, BLOOD (ROUTINE X 2)     Status: None   Collection Time    12/22/13  6:30 PM      Result Value Ref Range Status   Specimen Description BLOOD RIGHT LINE  5 ML IN [facility] BOTTLE   Final   Special Requests Immunocompromised   Final   Culture  Setup Time     Final   Value: 12/23/2013 00:06     Performed at [facility]   Culture     Final   Value:        BLOOD CULTURE RECEIVED NO GROWTH TO DATE CULTURE WILL BE HELD FOR 5 DAYS BEFORE ISSUING A FINAL NEGATIVE REPORT     Performed at [facility]   Report Status PENDING   Incomplete  URINE CULTURE     Status: None   Collection Time    12/23/13  6:21 PM      Result Value Ref Range Status   Specimen Description URINE, CATHETERIZED   Final   Special Requests Immunocompromised   Final   Culture  Setup Time     Final   Value: 12/24/2013 00:28     Performed at [place]     Final   Value: 25,000 COLONIES/ML     Performed at [facility]   Culture     Final   Value: PSEUDOMONAS AERUGINOSA     Performed at [facility]   Report Status 12/26/2013 FINAL   Final   Organism ID, Bacteria PSEUDOMONAS AERUGINOSA   Final     Studies: No results found.  Scheduled Meds: . amLODipine  2.5 mg Oral q morning - 10a  . baclofen  20 mg Oral QID  .  ceFAZolin (ANCEF) IV  2 g Intravenous On Call to OR  . collagenase   Topical Daily  . docusate sodium  100 mg Oral BID  . enoxaparin (LOVENOX) injection  50 mg Subcutaneous Daily  . famotidine  20 mg Oral BID  . feeding supplement (ENSURE COMPLETE)  237 mL Oral BID BM  . feeding supplement (PRO-STAT SUGAR FREE 64)  30 mL Oral TID WC  . fosfomycin  3 g Oral Once  . metoCLOPramide   10 mg Oral TID AC  . multivitamin with minerals  1 tablet Oral q morning - 10a  . potassium chloride SA  20 mEq Oral BID  . sodium chloride  250 mL Intravenous Once  . vitamin C  500 mg Oral q morning - 10a  . zinc sulfate  220 mg Oral q morning - 10a   Continuous Infusions: . sodium chloride 125 mL/hr at 12/22/13 0024  . sodium chloride 75 mL/hr at 12/25/13 2142      Time spent: 25 minutes    Louellen Molder  [facility] Pager [phone] If 7PM-7AM, please contact night-coverage at [email or web address], password [facility] 12/28/2013, 2:38 PM  LOS: 7 days

## 2013-12-29 LAB — CULTURE, BLOOD (ROUTINE X 2): Culture: NO GROWTH

## 2013-12-29 NOTE — Progress Notes (Signed)
RT placed patient on CPAP on 19 cmH2O per home regimen. Sterile water was placed into water chamber for humidification. Patient is tolerating well at this time. RT will monitor as needed.

## 2013-12-29 NOTE — Progress Notes (Signed)
RT placed patient on CPAP on 19 cmH2O. Sterile water placed in water chamber for humidification. Patient is tolerating well. RT to monitor as needed.

## 2013-12-29 NOTE — Progress Notes (Signed)
[facility] PROGRESS NOTE  [name] [name] VOZ:[id number] DOB: July 15, 1965 DOA: 12/21/2013 PCP: [name], MD   Brief narrative 48 year old with C5 quadriplegia,  nonhealing sacral wound stage IV, the current osteoarthritis, seizure disorder, anemia, was on CPAP history of suprapubic catheter along with the wording colostomy was admitted on 12/22/13 with sepsis secondary to possible pyelonephritis versus new pelvic abscess.    Assessment/Plan: Sepsis on admission Now resolved.  possibly secondary to new pelvic abscesses as seen on MRI vs UTI. MRI pelvis done on 8/13 shows new fluid collection adjacent to right ilium and deep to proximal right femur highly suspicious for new abscess Appreciate ID and plastic sx recommendation. Plastic surgery Plan on OR for debridement tomorrow. NPO aftermidnight. dced zosyn ( after 5 days to improve deep tissue cx yield)   UTI Urine cx growing 25k pseudomonas resistant to zosyn. Will follow ID recommendations. . Given a dose of fosfomycin on 8/12. off zosyn .  OSA Cont bedtime CPAP  Microcytic anemia Hb of 7.2. transfuse if ,7  Paraplegia  Stable. Has deep sacral decub with drainiage   DVT prophylaxis   Code Status: full Family Communication:none Disposition Plan: currently inpt   Consultants:  Plastic sx  ID  Procedures:  MRI on 8/12  Antibiotics: IV zosyn ( 8/8-8/12)   HPI/Subjective: Nurse reports pt having some drainage over suprapubic site. Dressing applied. Appears clean on evaluation this am.   Objective: Filed Vitals:   12/29/13 0508  BP: 127/75  Pulse: 88  Temp: 98.8 F (37.1 C)  Resp: 16    Intake/Output Summary (Last 24 hours) at 12/29/13 1044 Last data filed at 12/29/13 0653  Gross per 24 hour  Intake 1703.75 ml  Output   5430 ml  Net -3726.25 ml   Filed Weights   12/22/13 0147  Weight: 102.513 kg (226 lb)    Exam:   General:  NAD  HEENT: moist mucosa  Cardiovascular:  NS1&S2  Respiratory: clear b/l  Abdomen: SOFT, NT, ND, 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 and suprapubic catheter( appears clean)  Musculoskeletal: quadriplegic, sacral decubitus  Data Reviewed: Basic Metabolic Panel:  Recent Labs Lab 12/23/13 0805 12/25/13 0500  NA 137 141  K 3.9 4.0  CL 100 107  CO2 25 25  GLUCOSE 105* 96  BUN 13 10  CREATININE 0.54 0.47*  CALCIUM 8.6 8.8   Liver Function Tests: No results found for this basename: AST, ALT, ALKPHOS, BILITOT, PROT, ALBUMIN,  in the last 168 hours No results found for this basename: LIPASE, AMYLASE,  in the last 168 hours No results found for this basename: AMMONIA,  in the last 168 hours CBC:  Recent Labs Lab 12/23/13 0805 12/24/13 1400 12/25/13 0500 12/26/13 0500 12/28/13 0500  WBC 11.8* 10.1 9.9 10.1 10.6*  NEUTROABS 8.4* 7.1  --   --   --   HGB 7.1* 7.3* 7.1* 7.3* 7.2*  HCT 23.3* 24.3* 23.9* 24.3* 23.8*  MCV 72.6* 73.0* 73.5* 72.5* 73.0*  PLT 379 377 407* 428* 459*   Cardiac Enzymes: No results found for this basename: CKTOTAL, CKMB, CKMBINDEX, TROPONINI,  in the last 168 hours BNP (last 3 results)  Recent Labs  07/19/13 1938  PROBNP 79.5   CBG: No results found for this basename: GLUCAP,  in the last 168 hours  Recent Results (from the past 240 hour(s))  CULTURE, BLOOD (ROUTINE X 2)     Status: None   Collection Time    12/21/13 11:35 PM      Result Value Ref  Range Status   Specimen Description BLOOD LEFT HAND   Final   Special Requests BOTTLES DRAWN AEROBIC ONLY 5CC   Final   Culture  Setup Time     Final   Value: 12/22/2013 13:02     Performed at [facility]   Culture     Final   Value: NO GROWTH 5 DAYS     Performed at [facility]   Report Status 12/28/2013 FINAL   Final  CULTURE, BLOOD (ROUTINE X 2)     Status: None   Collection Time    12/21/13 11:40 PM      Result Value Ref Range Status   Specimen Description BLOOD LEFT HAND   Final   Special Requests BOTTLES DRAWN AEROBIC ONLY  5CC   Final   Culture  Setup Time     Final   Value: 12/22/2013 13:02     Performed at [facility]   Culture     Final   Value: NO GROWTH 5 DAYS     Performed at [facility]   Report Status 12/28/2013 FINAL   Final  CULTURE, BLOOD (ROUTINE X 2)     Status: None   Collection Time    12/22/13  6:30 PM      Result Value Ref Range Status   Specimen Description BLOOD RIGHT LINE  5 ML IN [facility] BOTTLE   Final   Special Requests Immunocompromised   Final   Culture  Setup Time     Final   Value: 12/23/2013 00:06     Performed at [facility]   Culture     Final   Value: NO GROWTH 5 DAYS     Performed at [facility]   Report Status 12/29/2013 FINAL   Final  URINE CULTURE     Status: None   Collection Time    12/23/13  6:21 PM      Result Value Ref Range Status   Specimen Description URINE, CATHETERIZED   Final   Special Requests Immunocompromised   Final   Culture  Setup Time     Final   Value: 12/24/2013 00:28     Performed at [place]     Final   Value: 25,000 COLONIES/ML     Performed at [facility]   Culture     Final   Value: PSEUDOMONAS AERUGINOSA     Performed at [facility]   Report Status 12/26/2013 FINAL   Final   Organism ID, Bacteria PSEUDOMONAS AERUGINOSA   Final     Studies: No results found.  Scheduled Meds: . amLODipine  2.5 mg Oral q morning - 10a  . baclofen  20 mg Oral QID  . collagenase   Topical Daily  . docusate sodium  100 mg Oral BID  . enoxaparin (LOVENOX) injection  50 mg Subcutaneous Daily  . famotidine  20 mg Oral BID  . feeding supplement (ENSURE COMPLETE)  237 mL Oral BID BM  . feeding supplement (PRO-STAT SUGAR FREE 64)  30 mL Oral TID WC  . metoCLOPramide  10 mg Oral TID AC  . multivitamin with minerals  1 tablet Oral q morning - 10a  . potassium chloride SA  20 mEq Oral BID  . sodium chloride  250 mL Intravenous Once  . vitamin C  500 mg Oral q morning - 10a   . zinc sulfate  220 mg Oral q morning -  10a   Continuous Infusions: . sodium chloride 125 mL/hr at 12/22/13 0024  . sodium chloride 75 mL/hr at 12/29/13 0503      Time spent: 25 minutes    Louellen Molder  [facility] Pager [phone] If 7PM-7AM, please contact night-coverage at [email or web address], password [facility] 12/29/2013, 10:44 AM  LOS: 8 days

## 2013-12-30 ENCOUNTER — Encounter (HOSPITAL_COMMUNITY)
Admission: EM | Disposition: A | Discharge status: Home Health | Payer: Self-pay | Source: Home / Self Care | Attending: Internal Medicine

## 2013-12-30 ENCOUNTER — Encounter (HOSPITAL_COMMUNITY): Payer: Self-pay | Admitting: Plastic Surgery

## 2013-12-30 ENCOUNTER — Encounter (HOSPITAL_BASED_OUTPATIENT_CLINIC_OR_DEPARTMENT_OTHER): Payer: Medicare Other

## 2013-12-30 ENCOUNTER — Inpatient Hospital Stay (HOSPITAL_COMMUNITY): Payer: Medicare Other | Admitting: Anesthesiology

## 2013-12-30 ENCOUNTER — Encounter (HOSPITAL_COMMUNITY): Payer: Medicare Other | Admitting: Anesthesiology

## 2013-12-30 HISTORY — PX: INCISION AND DRAINAGE OF WOUND: SHX1803

## 2013-12-30 HISTORY — PX: APPLICATION OF A-CELL OF BACK: SHX6301

## 2013-12-30 SURGERY — IRRIGATION AND DEBRIDEMENT WOUND
Anesthesia: General

## 2013-12-30 MED ORDER — ONDANSETRON HCL 4 MG/2ML IJ SOLN
INTRAMUSCULAR | Status: DC | PRN
  Administered 2013-12-30: 4 mg via INTRAVENOUS

## 2013-12-30 MED ORDER — METOCLOPRAMIDE HCL 5 MG/ML IJ SOLN
INTRAMUSCULAR | Status: DC | PRN
  Administered 2013-12-30: 10 mg via INTRAVENOUS

## 2013-12-30 MED ORDER — 0.9 % SODIUM CHLORIDE (POUR BTL) OPTIME
TOPICAL | Status: DC | PRN
  Administered 2013-12-30: 1000 mL

## 2013-12-30 MED ORDER — MIDAZOLAM HCL 2 MG/2ML IJ SOLN
INTRAMUSCULAR | Status: AC
  Filled 2013-12-30: qty 2

## 2013-12-30 MED ORDER — PHENYLEPHRINE 40 MCG/ML (10ML) SYRINGE FOR IV PUSH (FOR BLOOD PRESSURE SUPPORT)
PREFILLED_SYRINGE | INTRAVENOUS | Status: AC
  Filled 2013-12-30: qty 10

## 2013-12-30 MED ORDER — LIDOCAINE HCL (CARDIAC) 20 MG/ML IV SOLN
INTRAVENOUS | Status: AC
  Filled 2013-12-30: qty 5

## 2013-12-30 MED ORDER — PRO-STAT SUGAR FREE PO LIQD
30.0000 mL | Freq: Two times a day (BID) | ORAL | Status: DC
  Administered 2013-12-31 – 2014-01-04 (×6): 30 mL via ORAL
  Filled 2013-12-30 (×15): qty 30

## 2013-12-30 MED ORDER — ONDANSETRON HCL 4 MG/2ML IJ SOLN
INTRAMUSCULAR | Status: AC
  Filled 2013-12-30: qty 2

## 2013-12-30 MED ORDER — EPHEDRINE SULFATE 50 MG/ML IJ SOLN
INTRAMUSCULAR | Status: DC | PRN
  Administered 2013-12-30: 10 mg via INTRAVENOUS

## 2013-12-30 MED ORDER — METOCLOPRAMIDE HCL 5 MG/ML IJ SOLN
INTRAMUSCULAR | Status: AC
  Filled 2013-12-30: qty 2

## 2013-12-30 MED ORDER — ROCURONIUM BROMIDE 100 MG/10ML IV SOLN
INTRAVENOUS | Status: DC | PRN
  Administered 2013-12-30: 40 mg via INTRAVENOUS

## 2013-12-30 MED ORDER — DEXTROSE 5 % IV SOLN
2.0000 g | INTRAVENOUS | Status: DC
  Administered 2013-12-30 – 2014-01-02 (×4): 2 g via INTRAVENOUS
  Filled 2013-12-30 (×6): qty 2

## 2013-12-30 MED ORDER — FENTANYL CITRATE 0.05 MG/ML IJ SOLN
INTRAMUSCULAR | Status: AC
  Filled 2013-12-30: qty 5

## 2013-12-30 MED ORDER — PHENYLEPHRINE HCL 10 MG/ML IJ SOLN
INTRAMUSCULAR | Status: DC | PRN
  Administered 2013-12-30: 100 ug via INTRAVENOUS
  Administered 2013-12-30 (×2): 80 ug via INTRAVENOUS
  Administered 2013-12-30: 40 ug via INTRAVENOUS

## 2013-12-30 MED ORDER — LACTATED RINGERS IV SOLN
INTRAVENOUS | Status: DC | PRN
  Administered 2013-12-30 (×2): via INTRAVENOUS

## 2013-12-30 MED ORDER — LIDOCAINE HCL (CARDIAC) 20 MG/ML IV SOLN
INTRAVENOUS | Status: DC | PRN
  Administered 2013-12-30: 50 mg via INTRAVENOUS

## 2013-12-30 MED ORDER — PHENOL 1.4 % MT LIQD
1.0000 | OROMUCOSAL | Status: DC | PRN
  Filled 2013-12-30: qty 177

## 2013-12-30 MED ORDER — GLYCOPYRROLATE 0.2 MG/ML IJ SOLN
INTRAMUSCULAR | Status: DC | PRN
  Administered 2013-12-30: .4 mg via INTRAVENOUS

## 2013-12-30 MED ORDER — SODIUM CHLORIDE 0.9 % IR SOLN
Status: DC | PRN
  Administered 2013-12-30: 13:00:00

## 2013-12-30 MED ORDER — VANCOMYCIN HCL 10 G IV SOLR
2500.0000 mg | Freq: Once | INTRAVENOUS | Status: AC
  Administered 2013-12-30: 2500 mg via INTRAVENOUS
  Filled 2013-12-30: qty 2500

## 2013-12-30 MED ORDER — PROPOFOL 10 MG/ML IV BOLUS
INTRAVENOUS | Status: DC | PRN
  Administered 2013-12-30: 200 mg via INTRAVENOUS

## 2013-12-30 MED ORDER — FENTANYL CITRATE 0.05 MG/ML IJ SOLN
INTRAMUSCULAR | Status: DC | PRN
  Administered 2013-12-30: 50 ug via INTRAVENOUS
  Administered 2013-12-30: 25 ug via INTRAVENOUS

## 2013-12-30 MED ORDER — PROMETHAZINE HCL 25 MG/ML IJ SOLN: 6.2500 mg | INTRAMUSCULAR | Status: DC | PRN

## 2013-12-30 MED ORDER — METRONIDAZOLE 500 MG PO TABS
500.0000 mg | ORAL_TABLET | Freq: Three times a day (TID) | ORAL | Status: DC
  Administered 2013-12-30 – 2014-01-03 (×12): 500 mg via ORAL
  Filled 2013-12-30 (×15): qty 1

## 2013-12-30 MED ORDER — NEOSTIGMINE METHYLSULFATE 10 MG/10ML IV SOLN
INTRAVENOUS | Status: DC | PRN
  Administered 2013-12-30: 3 mg via INTRAVENOUS

## 2013-12-30 MED ORDER — MIDAZOLAM HCL 5 MG/5ML IJ SOLN
INTRAMUSCULAR | Status: DC | PRN
  Administered 2013-12-30: 2 mg via INTRAVENOUS

## 2013-12-30 MED ORDER — PROPOFOL 10 MG/ML IV BOLUS
INTRAVENOUS | Status: AC
  Filled 2013-12-30: qty 20

## 2013-12-30 MED ORDER — JUVEN PO PACK
1.0000 | PACK | Freq: Two times a day (BID) | ORAL | Status: DC
  Administered 2013-12-31 – 2014-01-04 (×8): 1 via ORAL
  Filled 2013-12-30 (×14): qty 1

## 2013-12-30 MED ORDER — FENTANYL CITRATE 0.05 MG/ML IJ SOLN: 25.0000 ug | INTRAMUSCULAR | Status: DC | PRN

## 2013-12-30 MED ORDER — LACTATED RINGERS IV SOLN
INTRAVENOUS | Status: DC
  Administered 2013-12-30: 1000 mL via INTRAVENOUS

## 2013-12-30 MED ORDER — VANCOMYCIN HCL IN DEXTROSE 1-5 GM/200ML-% IV SOLN
1000.0000 mg | Freq: Two times a day (BID) | INTRAVENOUS | Status: DC
  Administered 2013-12-31 – 2014-01-01 (×4): 1000 mg via INTRAVENOUS
  Filled 2013-12-30 (×5): qty 200

## 2013-12-30 SURGICAL SUPPLY — 28 items
BLADE HEX COATED 2.75 (ELECTRODE) ×2 IMPLANT
CANISTER SUCTION 2500CC (MISCELLANEOUS) ×2 IMPLANT
COVER SURGICAL LIGHT HANDLE (MISCELLANEOUS) ×2 IMPLANT
DECANTER SPIKE VIAL GLASS SM (MISCELLANEOUS) IMPLANT
DRAPE INCISE IOBAN 66X45 STRL (DRAPES) ×1 IMPLANT
DRAPE LAPAROTOMY T 102X78X121 (DRAPES) ×2 IMPLANT
DRAPE UTILITY XL STRL (DRAPES) ×2 IMPLANT
DRSG ADAPTIC 3X8 NADH LF (GAUZE/BANDAGES/DRESSINGS) ×3 IMPLANT
DRSG PAD ABDOMINAL 8X10 ST (GAUZE/BANDAGES/DRESSINGS) ×6 IMPLANT
DRSG VAC ATS SM SENSATRAC (GAUZE/BANDAGES/DRESSINGS) ×1 IMPLANT
ELECT REM PT RETURN 9FT ADLT (ELECTROSURGICAL) ×2
ELECTRODE REM PT RTRN 9FT ADLT (ELECTROSURGICAL) ×1 IMPLANT
GAUZE SPONGE 4X4 12PLY STRL (GAUZE/BANDAGES/DRESSINGS) ×2 IMPLANT
GOWN STRL REUS W/TWL LRG LVL3 (GOWN DISPOSABLE) ×4 IMPLANT
HANDPIECE INTERPULSE COAX TIP (DISPOSABLE) ×2
KIT BASIN OR (CUSTOM PROCEDURE TRAY) ×2 IMPLANT
MATRIX SURGICAL PSM 10X15CM (Tissue) ×1 IMPLANT
MICROMATRIX 1000MG (Tissue) ×4 IMPLANT
NEEDLE HYPO 22GX1.5 SAFETY (NEEDLE) IMPLANT
NS IRRIG 1000ML POUR BTL (IV SOLUTION) ×2 IMPLANT
PACK GENERAL/GYN (CUSTOM PROCEDURE TRAY) ×2 IMPLANT
SET HNDPC FAN SPRY TIP SCT (DISPOSABLE) ×1 IMPLANT
SOLUTION PARTIC MCRMTRX 1000MG (Tissue) IMPLANT
STAPLER VISISTAT 35W (STAPLE) ×2 IMPLANT
SUT VIC AB 5-0 RB1 27 (SUTURE) ×3 IMPLANT
SYR CONTROL 10ML LL (SYRINGE) IMPLANT
TAPE CLOTH SURG 4X10 WHT LF (GAUZE/BANDAGES/DRESSINGS) ×2 IMPLANT
TOWEL OR 17X26 10 PK STRL BLUE (TOWEL DISPOSABLE) ×2 IMPLANT

## 2013-12-30 NOTE — H&P (View-Only) (Signed)
[facility]  NAME:  [name], [name] NO.:  [id number]  MEDICAL RECORD NO.:  [id number]      DATE OF BIRTH:  03/09/1966  PHYSICIAN:  [name], DO       VISIT DATE:  12/09/2013                                  OFFICE VISIT   HISTORY OF PRESENT ILLNESS:  The patient is a 48 year old here for a followup on his sacral ulcer and right shoulder ulcer.  He has been doing wet-to-dry dressings at home with some improvement in some of them and some areas have gotten worse.  There is no change in his medication or social history.  PHYSICAL EXAMINATION:  GENERAL:  On exam, he is alert and oriented, cooperative, not in any acute distress.  He is pleasant. HEENT:  Pupils are equal.  Extraocular muscles are intact. NECK:  He does not have any cervical lymphadenopathy. CHEST:  His breathing is unlabored. HEART:  His heart rate is regular.  He is very pleasant as usual.  Debridement was done of the right ischial ulcer, the shoulder ulcer, and the scrotal ulcer, and __________ noted.  We will do Santyl wet-to-dry dressings and follow up in 3 weeks.  We will also check a prealbumin.     [name], DO     CS/MEDQ  D:  12/09/2013  T:  12/10/2013  Job:  [id number]

## 2013-12-30 NOTE — Brief Op Note (Signed)
12/21/2013 - 12/30/2013  1:02 PM  PATIENT:  [name] [name]  48 y.o. 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   PROCEDURE:  Procedure(s): IRRIGATION AND DEBRIDEMENT ISCHIAL ULCER BONE BIOPSY  (N/A) PLACEMENT OF A-CELL  AND VAC  (N/A)  SURGEON:  Surgeon(s) and Role:    * Claire Sanger, DO - Primary  PHYSICIAN ASSISTANT: Shawn Rayburn, PA  ASSISTANTS: none   ANESTHESIA:   general  EBL:  Total I/O In: -  Out: 1425 [Urine:1300; Stool:125]  BLOOD ADMINISTERED:none  DRAINS: none   LOCAL MEDICATIONS USED:  NONE  SPECIMEN:  Source of Specimen:  bone  DISPOSITION OF SPECIMEN:  micro  COUNTS:  YES  TOURNIQUET:  * No tourniquets in log *  DICTATION: .Dragon Dictation  PLAN OF CARE: Admit to inpatient   PATIENT DISPOSITION:  PACU - hemodynamically stable.   Delay start of Pharmacological VTE agent (>24hrs) due to surgical blood loss or risk of bleeding: no
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
## 2013-12-30 NOTE — Op Note (Signed)
Operative Note   DATE OF OPERATION: 12/30/2013  LOCATION: [place] [facility]  SURGICAL DIVISION: Plastic Surgery  PREOPERATIVE DIAGNOSES:  Sacral / ischial ulcers and right shoulder ulcer  POSTOPERATIVE DIAGNOSES:  same  PROCEDURE:  Preparation of sacral (4 x 4 x .5 cm), ischial (4 x 3 x 10cm) and right shoulder ulcers (4 x 3 x 1 cm) for placement of Acell (3 gm of powder Acell and a 10 x 15 Acell sheet) with debridement soft tissue, skin, muscle and bone.  SURGEON: [name], DO  ASSISTANT: Shawn Rayburn, PA  ANESTHESIA:  General.   COMPLICATIONS: None.   INDICATIONS FOR PROCEDURE:  The patient, [name] [name] is a 48 y.o. male born on Mar 14, 1966, is here for treatment of multiple sacral / ischial ulcers. MRN: [id number]  CONSENT:  Informed consent was obtained directly from the patient. Risks, benefits and alternatives were fully discussed. Specific risks including but not limited to bleeding, infection, hematoma, seroma, scarring, pain, infection, contracture, asymmetry, wound healing problems, and need for further surgery were all discussed. The patient did have an ample opportunity to have questions answered to satisfaction.   DESCRIPTION OF PROCEDURE:  The patient was taken to the operating room. SCDs were placed. The patient's operative site was prepped and draped in a sterile fashion. A time out was performed and all information was confirmed to be correct.  General anesthesia was administered.  The sacral and ischial areas were debrided with the currette and #10 blade of nonviable skin, subcutaneous tissue and muscle.  The right posterior shoulder was debrided with a #10 blade (skin, subcutaneous tissue).  The ronguer was used to debride the right ischium/femoral head area.  Cultures were sent to micro.  All areas were irrigated with antibiotic solution.  Hemostasis was achieved with electrocautery. Acell powder and a sheet were applied to the areas followed by adaptic.   Surgical lube was placed.  The VAC was applied to the right femoral/ischial area.  An excellent seal was achieved.   The patient tolerated the procedure well.  There were no complications. The patient was allowed to wake from anesthesia, extubated and taken to the recovery room in satisfactory condition.

## 2013-12-30 NOTE — Interval H&P Note (Signed)
History and Physical Interval Note:  12/30/2013 11:13 AM  [name] [name]  has presented today for surgery, with the diagnosis of STAGE 4 BILATERAL ISCHIAL ULCER STAGE 3 SACRAL ULCER   The various methods of treatment have been discussed with the patient and family. After consideration of risks, benefits and other options for treatment, the patient has consented to  Procedure(s): IRRIGATION AND DEBRIDEMENT ISCHIAL ULCER BONE BIOPSY  (N/A) PLACEMENT OF A-CELL  AND VAC  (N/A) as a surgical intervention .  The patient's history has been reviewed, patient examined, no change in status, stable for surgery.  I have reviewed the patient's chart and labs.  Questions were answered to the patient's satisfaction.     SANGER,CLAIRE
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
## 2013-12-30 NOTE — Progress Notes (Signed)
[facility] PROGRESS NOTE  [name] [name] EQA:[id number] DOB: 1966-05-12 DOA: 12/21/2013 PCP: [name], MD   Brief narrative 48 year old with C5 quadriplegia,  nonhealing sacral wound stage IV, the current osteoarthritis, seizure disorder, anemia, was on CPAP history of suprapubic catheter along with the wording colostomy was admitted on 12/22/13 with sepsis secondary to possible pyelonephritis versus new pelvic abscess.    Assessment/Plan: Sepsis on admission Now resolved.  possibly secondary to new pelvic abscesses as seen on MRI vs UTI. MRI pelvis done on 8/13 shows new fluid collection adjacent to right ilium and deep to proximal right femur highly suspicious for new abscess Appreciate ID and plastic sx recommendation.  OR today. Will follow cx. Received zosyn for 5 days and dced.  UTI Urine cx growing 25k pseudomonas resistant to zosyn. Will follow ID recommendations. . Given a dose of fosfomycin on 8/12. off zosyn .  OSA Cont bedtime CPAP  Microcytic anemia Hb of 7.2.. Recheck in am  Paraplegia  Stable. Has deep sacral decub with drainiage   DVT prophylaxis   Code Status: full Family Communication: none Disposition Plan: home with HH once stable post op   Consultants:  Plastic sx  ID  Procedures:  MRI on 8/12  Antibiotics: IV zosyn ( 8/8-8/12)   HPI/Subjective: No overnight issues  Objective: Filed Vitals:   12/30/13 0612  BP: 102/67  Pulse: 82  Temp: 97.4 F (36.3 C)  Resp: 18    Intake/Output Summary (Last 24 hours) at 12/30/13 0959 Last data filed at 12/30/13 9798  Gross per 24 hour  Intake   1680 ml  Output   4490 ml  Net  -2810 ml   Filed Weights   12/22/13 0147  Weight: 102.513 kg (226 lb)    Exam:   General:  NAD  HEENT: moist mucosa  Cardiovascular: NS1&S2  Respiratory: clear b/l  Abdomen: SOFT, NT, ND, 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 and suprapubic catheter  Musculoskeletal: quadriplegic, sacral decubitus  Data  Reviewed: Basic Metabolic Panel:  Recent Labs Lab 12/25/13 0500  NA 141  K 4.0  CL 107  CO2 25  GLUCOSE 96  BUN 10  CREATININE 0.47*  CALCIUM 8.8   Liver Function Tests: No results found for this basename: AST, ALT, ALKPHOS, BILITOT, PROT, ALBUMIN,  in the last 168 hours No results found for this basename: LIPASE, AMYLASE,  in the last 168 hours No results found for this basename: AMMONIA,  in the last 168 hours CBC:  Recent Labs Lab 12/24/13 1400 12/25/13 0500 12/26/13 0500 12/28/13 0500  WBC 10.1 9.9 10.1 10.6*  NEUTROABS 7.1  --   --   --   HGB 7.3* 7.1* 7.3* 7.2*  HCT 24.3* 23.9* 24.3* 23.8*  MCV 73.0* 73.5* 72.5* 73.0*  PLT 377 407* 428* 459*   Cardiac Enzymes: No results found for this basename: CKTOTAL, CKMB, CKMBINDEX, TROPONINI,  in the last 168 hours BNP (last 3 results)  Recent Labs  07/19/13 1938  PROBNP 79.5   CBG: No results found for this basename: GLUCAP,  in the last 168 hours  Recent Results (from the past 240 hour(s))  CULTURE, BLOOD (ROUTINE X 2)     Status: None   Collection Time    12/21/13 11:35 PM      Result Value Ref Range Status   Specimen Description BLOOD LEFT HAND   Final   Special Requests BOTTLES DRAWN AEROBIC ONLY 5CC   Final   Culture  Setup Time     Final  Value: 12/22/2013 13:02     Performed at [facility]   Culture     Final   Value: NO GROWTH 5 DAYS     Performed at [facility]   Report Status 12/28/2013 FINAL   Final  CULTURE, BLOOD (ROUTINE X 2)     Status: None   Collection Time    12/21/13 11:40 PM      Result Value Ref Range Status   Specimen Description BLOOD LEFT HAND   Final   Special Requests BOTTLES DRAWN AEROBIC ONLY 5CC   Final   Culture  Setup Time     Final   Value: 12/22/2013 13:02     Performed at [facility]   Culture     Final   Value: NO GROWTH 5 DAYS     Performed at [facility]   Report Status 12/28/2013 FINAL   Final  CULTURE, BLOOD (ROUTINE X 2)      Status: None   Collection Time    12/22/13  6:30 PM      Result Value Ref Range Status   Specimen Description BLOOD RIGHT LINE  5 ML IN [facility] BOTTLE   Final   Special Requests Immunocompromised   Final   Culture  Setup Time     Final   Value: 12/23/2013 00:06     Performed at [facility]   Culture     Final   Value: NO GROWTH 5 DAYS     Performed at [facility]   Report Status 12/29/2013 FINAL   Final  URINE CULTURE     Status: None   Collection Time    12/23/13  6:21 PM      Result Value Ref Range Status   Specimen Description URINE, CATHETERIZED   Final   Special Requests Immunocompromised   Final   Culture  Setup Time     Final   Value: 12/24/2013 00:28     Performed at [place]     Final   Value: 25,000 COLONIES/ML     Performed at [facility]   Culture     Final   Value: PSEUDOMONAS AERUGINOSA     Performed at [facility]   Report Status 12/26/2013 FINAL   Final   Organism ID, Bacteria PSEUDOMONAS AERUGINOSA   Final     Studies: No results found.  Scheduled Meds: . amLODipine  2.5 mg Oral q morning - 10a  . baclofen  20 mg Oral QID  . collagenase   Topical Daily  . docusate sodium  100 mg Oral BID  . enoxaparin (LOVENOX) injection  50 mg Subcutaneous Daily  . famotidine  20 mg Oral BID  . feeding supplement (ENSURE COMPLETE)  237 mL Oral BID BM  . feeding supplement (PRO-STAT SUGAR FREE 64)  30 mL Oral TID WC  . metoCLOPramide  10 mg Oral TID AC  . multivitamin with minerals  1 tablet Oral q morning - 10a  . potassium chloride SA  20 mEq Oral BID  . sodium chloride  250 mL Intravenous Once  . vitamin C  500 mg Oral q morning - 10a  . zinc sulfate  220 mg Oral q morning - 10a   Continuous Infusions: . sodium chloride 125 mL/hr at 12/22/13 0024  . sodium chloride 75 mL/hr at 12/29/13 0503      Time spent: 25 minutes    Talea Manges  [facility] Pager [phone] If 7PM-7AM,  please contact night-coverage at [email or web address], password [facility] 12/30/2013, 9:59 AM  LOS: 9 days
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
## 2013-12-30 NOTE — Transfer of Care (Signed)
Immediate Anesthesia Transfer of Care Note  Patient: [name] [name]  Procedure(s) Performed: Procedure(s): IRRIGATION AND DEBRIDEMENT SACRUM AND RIGHT SHOULDER ISCHIAL ULCER BONE BIOPSY  (N/A) PLACEMENT OF A-CELL  AND VAC  (N/A)  Patient Location: PACU  Anesthesia Type:General  Level of Consciousness: awake, alert  and oriented  Airway & Oxygen Therapy: Patient Spontanous Breathing and Patient connected to face mask oxygen  Post-op Assessment: Report given to PACU RN and Post -op Vital signs reviewed and stable  Post vital signs: Reviewed and stable  Complications: No apparent anesthesia complications

## 2013-12-31 LAB — CBC
HCT: 24.3 % — ABNORMAL LOW (ref 39.0–52.0)
Hemoglobin: 7.3 g/dL — ABNORMAL LOW (ref 13.0–17.0)
MCH: 22.5 pg — AB (ref 26.0–34.0)
MCHC: 30 g/dL (ref 30.0–36.0)
MCV: 74.8 fL — ABNORMAL LOW (ref 78.0–100.0)
PLATELETS: 472 10*3/uL — AB (ref 150–400)
RBC: 3.25 MIL/uL — AB (ref 4.22–5.81)
RDW: 19.4 % — ABNORMAL HIGH (ref 11.5–15.5)
WBC: 9.8 10*3/uL (ref 4.0–10.5)

## 2013-12-31 LAB — BASIC METABOLIC PANEL
ANION GAP: 9 (ref 5–15)
BUN: 16 mg/dL (ref 6–23)
CALCIUM: 8.7 mg/dL (ref 8.4–10.5)
CO2: 27 mEq/L (ref 19–32)
Chloride: 105 mEq/L (ref 96–112)
Creatinine, Ser: 0.5 mg/dL (ref 0.50–1.35)
GFR calc Af Amer: >90 mL/min (ref >90)
GFR calc non Af Amer: >90 mL/min (ref >90)
Glucose, Bld: 113 mg/dL — ABNORMAL HIGH (ref 70–99)
Potassium: 4 mEq/L (ref 3.7–5.3)
Sodium: 141 mEq/L (ref 137–147)

## 2013-12-31 NOTE — Anesthesia Postprocedure Evaluation (Signed)
  Anesthesia Post-op Note  Patient: [name] [name]  Procedure(s) Performed: Procedure(s) (LRB): IRRIGATION AND DEBRIDEMENT SACRUM AND RIGHT SHOULDER ISCHIAL ULCER BONE BIOPSY  (N/A) PLACEMENT OF A-CELL  AND VAC  (N/A)  Patient Location: PACU  Anesthesia Type: General  Level of Consciousness: awake and alert   Airway and Oxygen Therapy: Patient Spontanous Breathing  Post-op Pain: mild  Post-op Assessment: Post-op Vital signs reviewed, Patient's Cardiovascular Status Stable, Respiratory Function Stable, Patent Airway and No signs of Nausea or vomiting  Last Vitals:  Filed Vitals:   12/31/13 0538  BP: 109/70  Pulse: 77  Temp: 36.7 C  Resp: 16    Post-op Vital Signs: stable   Complications: No apparent anesthesia complications

## 2013-12-31 NOTE — Progress Notes (Signed)
[facility] PROGRESS NOTE  [name] [name] RCV:[id number] DOB: 08/31/1965 DOA: 12/21/2013 PCP: [name], MD   Brief narrative Please refer to admission H&P from 12/22/2013 for details, but in brief, 48 year old with C5 quadriplegia with nonhealing sacral wound stage IV, recurrent osteomyelitis, seizure disorder, anemia, OSA on CPAP history of suprapubic catheter along with  colostomy was admitted on 12/22/13 with sepsis secondary to possible pyelonephritis versus new pelvic abscess/ osteomyelitis.    Assessment/Plan: Sepsis on admission Now resolved.  -possibly secondary to new pelvic abscesses as seen on MRI vs UTI. MRI pelvis done on 8/13 shows new fluid collection adjacent to right ilium and deep to proximal right femur highly suspicious for new abscess. -Appreciate ID and plastic sx recommendation.  -patient  taken to OR on 8/17 for debridement of sacral / ischial and right shoulder ulcer with debridement of soft tissue, skin muscle and bone and the wound VAC placed. Deep tissue cultures sent. -Started on empiric vancomycin, Rocephin and Flagyl by ID on 8/17. Follow cultures. - Received zosyn for 5 days last week and was dced.  UTI Urine cx growing 25k pseudomonas resistant to zosyn. follow ID recommendations. . Given a dose of fosfomycin on 8/12. off zosyn .  OSA Cont bedtime CPAP  Microcytic anemia Hb of 7.3.Marland Kitchen transfuse if less than 7  C5 quadriplegia Stable. Has deep sacral decub with suprapubic catheter and colostomy status  Hypertension Continue low dose amlodipine.  Protein calorie malnutrition Continue supplements  DVT prophylaxis  Diet: Regular  Code Status: full Family Communication: none, lives with friends Disposition Plan: home with [facility] pending final culture and decision on antibiotic choice and duration   Consultants:  Plastic sx (Dr. [name])  ID (Dr. Drucilla Schmidt)  Procedures:  MRI on 8/12  OR on 8/17 for debridement of sacral and right shoulder  ulcers  Antibiotics: IV zosyn ( 8/8-8/12) IV vancomycin, Rocephin and Flagyl (8/ 17--)   HPI/Subjective: Patient is in the water yesterday and tolerated well. Remains afebrile.  Objective: Filed Vitals:   12/31/13 0538  BP: 109/70  Pulse: 77  Temp: 98.1 F (36.7 C)  Resp: 16    Intake/Output Summary (Last 24 hours) at 12/31/13 1333 Last data filed at 12/31/13 1100  Gross per 24 hour  Intake 5913.75 ml  Output   2670 ml  Net 3243.75 ml   Filed Weights   12/22/13 0147  Weight: 102.513 kg (226 lb)    Exam:   General:  NAD  HEENT: moist mucosa  Cardiovascular: NS1&S2  Respiratory: clear b/l  Abdomen: SOFT, NT, ND, 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 and suprapubic catheter  Musculoskeletal: quadriplegic, sacral decubitus, has wound VAC.  CNS: Alert and oriented  Data Reviewed: Basic Metabolic Panel:  Recent Labs Lab 12/25/13 0500 12/31/13 0504  NA 141 141  K 4.0 4.0  CL 107 105  CO2 25 27  GLUCOSE 96 113*  BUN 10 16  CREATININE 0.47* 0.50  CALCIUM 8.8 8.7   Liver Function Tests: No results found for this basename: AST, ALT, ALKPHOS, BILITOT, PROT, ALBUMIN,  in the last 168 hours No results found for this basename: LIPASE, AMYLASE,  in the last 168 hours No results found for this basename: AMMONIA,  in the last 168 hours CBC:  Recent Labs Lab 12/24/13 1400 12/25/13 0500 12/26/13 0500 12/28/13 0500 12/31/13 0504  WBC 10.1 9.9 10.1 10.6* 9.8  NEUTROABS 7.1  --   --   --   --   HGB 7.3* 7.1* 7.3* 7.2* 7.3*  HCT 24.3*  23.9* 24.3* 23.8* 24.3*  MCV 73.0* 73.5* 72.5* 73.0* 74.8*  PLT 377 407* 428* 459* 472*   Cardiac Enzymes: No results found for this basename: CKTOTAL, CKMB, CKMBINDEX, TROPONINI,  in the last 168 hours BNP (last 3 results)  Recent Labs  07/19/13 1938  PROBNP 79.5   CBG: No results found for this basename: GLUCAP,  in the last 168 hours  Recent Results (from the past 240 hour(s))  CULTURE, BLOOD (ROUTINE X 2)     Status: None    Collection Time    12/21/13 11:35 PM      Result Value Ref Range Status   Specimen Description BLOOD LEFT HAND   Final   Special Requests BOTTLES DRAWN AEROBIC ONLY 5CC   Final   Culture  Setup Time     Final   Value: 12/22/2013 13:02     Performed at [facility]   Culture     Final   Value: NO GROWTH 5 DAYS     Performed at [facility]   Report Status 12/28/2013 FINAL   Final  CULTURE, BLOOD (ROUTINE X 2)     Status: None   Collection Time    12/21/13 11:40 PM      Result Value Ref Range Status   Specimen Description BLOOD LEFT HAND   Final   Special Requests BOTTLES DRAWN AEROBIC ONLY 5CC   Final   Culture  Setup Time     Final   Value: 12/22/2013 13:02     Performed at [facility]   Culture     Final   Value: NO GROWTH 5 DAYS     Performed at [facility]   Report Status 12/28/2013 FINAL   Final  CULTURE, BLOOD (ROUTINE X 2)     Status: None   Collection Time    12/22/13  6:30 PM      Result Value Ref Range Status   Specimen Description BLOOD RIGHT LINE  5 ML IN [facility] BOTTLE   Final   Special Requests Immunocompromised   Final   Culture  Setup Time     Final   Value: 12/23/2013 00:06     Performed at [facility]   Culture     Final   Value: NO GROWTH 5 DAYS     Performed at [facility]   Report Status 12/29/2013 FINAL   Final  URINE CULTURE     Status: None   Collection Time    12/23/13  6:21 PM      Result Value Ref Range Status   Specimen Description URINE, CATHETERIZED   Final   Special Requests Immunocompromised   Final   Culture  Setup Time     Final   Value: 12/24/2013 00:28     Performed at [place]     Final   Value: 25,000 COLONIES/ML     Performed at [facility]   Culture     Final   Value: PSEUDOMONAS AERUGINOSA     Performed at [facility]   Report Status 12/26/2013 FINAL   Final   Organism ID, Bacteria PSEUDOMONAS AERUGINOSA   Final  ANAEROBIC CULTURE      Status: None   Collection Time    12/30/13  1:50 PM      Result Value Ref Range Status   Specimen Description SACRAL RIGHT ISCHIUM   Final   Special Requests NONE   Final  Gram Stain     Final   Value: NO WBC SEEN     NO SQUAMOUS EPITHELIAL CELLS SEEN     NO ORGANISMS SEEN     Performed at [facility]   Culture     Final   Value: NO ANAEROBES ISOLATED; CULTURE IN PROGRESS FOR 5 DAYS     Performed at [facility]   Report Status PENDING   Incomplete  CULTURE, ROUTINE-ABSCESS     Status: None   Collection Time    12/30/13  1:50 PM      Result Value Ref Range Status   Specimen Description SACRAL RIGHT ISCHIUM   Final   Special Requests NONE   Final   Gram Stain     Final   Value: NO WBC SEEN     NO SQUAMOUS EPITHELIAL CELLS SEEN     NO ORGANISMS SEEN     Performed at [facility]   Culture     Final   Value: NO GROWTH 1 DAY     Performed at [facility]   Report Status PENDING   Incomplete     Studies: No results found.  Scheduled Meds: . amLODipine  2.5 mg Oral q morning - 10a  . baclofen  20 mg Oral QID  . cefTRIAXone (ROCEPHIN)  IV  2 g Intravenous Q24H  . collagenase   Topical Daily  . docusate sodium  100 mg Oral BID  . enoxaparin (LOVENOX) injection  50 mg Subcutaneous Daily  . famotidine  20 mg Oral BID  . feeding supplement (ENSURE COMPLETE)  237 mL Oral BID BM  . feeding supplement (PRO-STAT SUGAR FREE 64)  30 mL Oral BID WC  . metoCLOPramide  10 mg Oral TID AC  . metroNIDAZOLE  500 mg Oral 3 times per day  . multivitamin with minerals  1 tablet Oral q morning - 10a  . nutrition supplement (JUVEN)  1 packet Oral BID BM  . potassium chloride SA  20 mEq Oral BID  . sodium chloride  250 mL Intravenous Once  . vancomycin  1,000 mg Intravenous Q12H  . vitamin C  500 mg Oral q morning - 10a  . zinc sulfate  220 mg Oral q morning - 10a   Continuous Infusions: . sodium chloride Stopped (12/22/13 0245)  . sodium chloride  Stopped (12/30/13 1157)      Time spent: 25 minutes    Selby Foisy, [facility] Pager [phone] If 7PM-7AM, please contact night-coverage at [email or web address], password [facility] 12/31/2013, 1:33 PM  LOS: 10 days

## 2013-12-31 NOTE — Progress Notes (Signed)
[facility] [facility]  #2 vancomycin #2 rocephin  #2 flagyl Subjective: [name]   Antibiotics:  Anti-infectives   Start     Dose/Rate Route Frequency Ordered Stop   12/31/13 0600  vancomycin (VANCOCIN) IVPB 1000 mg/200 mL premix     1,000 mg 200 mL/hr over 60 Minutes Intravenous Every 12 hours 12/30/13 1612     12/30/13 1700  vancomycin (VANCOCIN) 2,500 mg in sodium chloride 0.9 % 500 mL IVPB     2,500 mg 250 mL/hr over 120 Minutes Intravenous  Once 12/30/13 1612 12/30/13 1932   12/30/13 1600  cefTRIAXone (ROCEPHIN) 2 g in dextrose 5 % 50 mL IVPB     2 g 100 mL/hr over 30 Minutes Intravenous Every 24 hours 12/30/13 1538     12/30/13 1545  metroNIDAZOLE (FLAGYL) tablet 500 mg     500 mg Oral 3 times per day 12/30/13 1538     12/30/13 1325  polymyxin B 500,000 Units, bacitracin 50,000 Units in sodium chloride irrigation 0.9 % 500 mL irrigation  Status:  Discontinued       As needed 12/30/13 1325 12/30/13 1417   12/28/13 0600  ceFAZolin (ANCEF) IVPB 2 g/50 mL premix     2 g 100 mL/hr over 30 Minutes Intravenous On call to O.R. 12/27/13 1621 12/29/13 0559   12/22/13 1400  vancomycin (VANCOCIN) 1,250 mg in sodium chloride 0.9 % 250 mL IVPB  Status:  Discontinued     1,250 mg 166.7 mL/hr over 90 Minutes Intravenous Every 12 hours 12/22/13 0201 12/22/13 1132   12/22/13 0800  piperacillin-tazobactam (ZOSYN) IVPB 3.375 g  Status:  Discontinued     3.375 g 12.5 mL/hr over 240 Minutes Intravenous Every 8 hours 12/22/13 0146 12/25/13 1628   12/22/13 0600  piperacillin-tazobactam (ZOSYN) IVPB 3.375 g  Status:  Discontinued     3.375 g 100 mL/hr over 30 Minutes Intravenous 3 times per day 12/22/13 0129 12/22/13 0146   12/22/13 0145  vancomycin (VANCOCIN) 2,500 mg in sodium chloride 0.9 % 500 mL IVPB     2,500 mg 250 mL/hr over 120 Minutes Intravenous  Once 12/22/13 0135 12/22/13 0428   12/21/13 2230  piperacillin-tazobactam (ZOSYN) IVPB 3.375 g     3.375  g 100 mL/hr over 30 Minutes Intravenous  Once 12/21/13 2216 12/22/13 1216   12/21/13 2215  vancomycin (VANCOCIN) IVPB 1000 mg/200 mL premix  Status:  Discontinued     1,000 mg 200 mL/hr over 60 Minutes Intravenous  Once 12/21/13 2211 12/22/13 0133      Medications: Scheduled Meds: . amLODipine  2.5 mg Oral q morning - 10a  . baclofen  20 mg Oral QID  . cefTRIAXone (ROCEPHIN)  IV  2 g Intravenous Q24H  . collagenase   Topical Daily  . docusate sodium  100 mg Oral BID  . enoxaparin (LOVENOX) injection  50 mg Subcutaneous Daily  . famotidine  20 mg Oral BID  . feeding supplement (ENSURE COMPLETE)  237 mL Oral BID BM  . feeding supplement (PRO-STAT SUGAR FREE 64)  30 mL Oral BID WC  . metoCLOPramide  10 mg Oral TID AC  . metroNIDAZOLE  500 mg Oral 3 times per day  . multivitamin with minerals  1 tablet Oral q morning - 10a  . nutrition supplement (JUVEN)  1 packet Oral BID BM  . potassium chloride SA  20 mEq Oral BID  . sodium chloride  250 mL Intravenous Once  . vancomycin  1,000 mg Intravenous Q12H  . vitamin C  500 mg Oral q morning - 10a  . zinc sulfate  220 mg Oral q morning - 10a   Continuous Infusions: . sodium chloride Stopped (12/22/13 0245)  . sodium chloride Stopped (12/30/13 1157)   PRN Meds:.acetaminophen, acetaminophen, alum & mag hydroxide-simeth, HYDROmorphone (DILAUDID) injection, ondansetron (ZOFRAN) IV, ondansetron, oxyCODONE, phenol, sodium chloride    Objective: Weight change:   Intake/Output Summary (Last 24 hours) at 12/31/13 1420 Last data filed at 12/31/13 1100  Gross per 24 hour  Intake 5913.75 ml  Output   2650 ml  Net 3263.75 ml   Blood pressure 109/70, pulse 77, temperature 98.1 F (36.7 C), temperature source Oral, resp. rate 16, height 6' (1.829 m), weight 226 lb (102.513 kg), SpO2 100.00%. Temp:  [97.5 F (36.4 C)-98.1 F (36.7 C)] 98.1 F (36.7 C) (08/18 0538) Pulse Rate:  [75-87] 77 (08/18 0538) Resp:  [13-16] 16 (08/18 0538) BP:  (95-151)/(50-84) 109/70 mmHg (08/18 0538) SpO2:  [100 %] 100 % (08/18 0538)  Physical Exam: General: Alert and awake, oriented x3, not in any acute distress. HEENT: anicteric sclera, EOMI CVS regular rate, normal  Chest: No wheezing no respiratory distress Abdomen: soft nontender, nondistended, normal bowel sounds, Skin: wound vaccums in place  Neuro: Quadriplegic  CBC:  Recent Labs Lab 12/25/13 0500 12/26/13 0500 12/28/13 0500 12/31/13 0504  HGB 7.1* 7.3* 7.2* 7.3*  HCT 23.9* 24.3* 23.8* 24.3*  PLT 407* 428* 459* 472*     BMET  Recent Labs  12/31/13 0504  NA 141  K 4.0  CL 105  CO2 27  GLUCOSE 113*  BUN 16  CREATININE 0.50  CALCIUM 8.7     Liver Panel  No results found for this basename: PROT, ALBUMIN, AST, ALT, ALKPHOS, BILITOT, BILIDIR, IBILI,  in the last 72 hours     Sedimentation Rate No results found for this basename: ESRSEDRATE,  in the last 72 hours C-Reactive Protein No results found for this basename: CRP,  in the last 72 hours  Micro Results: Recent Results (from the past 720 hour(s))  CULTURE, BLOOD (ROUTINE X 2)     Status: None   Collection Time    12/21/13 11:35 PM      Result Value Ref Range Status   Specimen Description BLOOD LEFT HAND   Final   Special Requests BOTTLES DRAWN AEROBIC ONLY 5CC   Final   Culture  Setup Time     Final   Value: 12/22/2013 13:02     Performed at [facility]   Culture     Final   Value: NO GROWTH 5 DAYS     Performed at [facility]   Report Status 12/28/2013 FINAL   Final  CULTURE, BLOOD (ROUTINE X 2)     Status: None   Collection Time    12/21/13 11:40 PM      Result Value Ref Range Status   Specimen Description BLOOD LEFT HAND   Final   Special Requests BOTTLES DRAWN AEROBIC ONLY 5CC   Final   Culture  Setup Time     Final   Value: 12/22/2013 13:02     Performed at [facility]   Culture     Final   Value: NO GROWTH 5 DAYS     Performed at [facility]    Report Status 12/28/2013 FINAL   Final  CULTURE, BLOOD (ROUTINE X 2)     Status: None   Collection  Time    12/22/13  6:30 PM      Result Value Ref Range Status   Specimen Description BLOOD RIGHT LINE  5 ML IN [facility] BOTTLE   Final   Special Requests Immunocompromised   Final   Culture  Setup Time     Final   Value: 12/23/2013 00:06     Performed at [facility]   Culture     Final   Value: NO GROWTH 5 DAYS     Performed at [facility]   Report Status 12/29/2013 FINAL   Final  URINE CULTURE     Status: None   Collection Time    12/23/13  6:21 PM      Result Value Ref Range Status   Specimen Description URINE, CATHETERIZED   Final   Special Requests Immunocompromised   Final   Culture  Setup Time     Final   Value: 12/24/2013 00:28     Performed at [facility]     Final   Value: 25,000 COLONIES/ML     Performed at [facility]   Culture     Final   Value: PSEUDOMONAS AERUGINOSA     Performed at [facility]   Report Status 12/26/2013 FINAL   Final   Organism ID, Bacteria PSEUDOMONAS AERUGINOSA   Final  ANAEROBIC CULTURE     Status: None   Collection Time    12/30/13  1:50 PM      Result Value Ref Range Status   Specimen Description SACRAL RIGHT ISCHIUM   Final   Special Requests NONE   Final   Gram Stain     Final   Value: NO WBC SEEN     NO SQUAMOUS EPITHELIAL CELLS SEEN     NO ORGANISMS SEEN     Performed at [facility]   Culture     Final   Value: NO ANAEROBES ISOLATED; CULTURE IN PROGRESS FOR 5 DAYS     Performed at [facility]   Report Status PENDING   Incomplete  CULTURE, ROUTINE-ABSCESS     Status: None   Collection Time    12/30/13  1:50 PM      Result Value Ref Range Status   Specimen Description SACRAL RIGHT ISCHIUM   Final   Special Requests NONE   Final   Gram Stain     Final   Value: NO WBC SEEN     NO SQUAMOUS EPITHELIAL CELLS SEEN     NO ORGANISMS SEEN     Performed at [facility]   Culture     Final   Value: NO GROWTH 1 DAY     Performed at [facility]   Report Status PENDING   Incomplete    Studies/Results: No results found.    Assessment/Plan:  Principal Problem:   Infected decubitus ulcer Active Problems:   Quadriplegia   Seizure disorder   HTN (hypertension)   OSA on CPAP   Anemia   UTI (lower urinary tract infection)   Wound infection   Decubitus ulcer of ischium, stage 3   Decubitus ulcer of sacral region, stage 3   Decubitus ulcer of back, stage 3   Decubitus ulcer of lower extremity, stage 2    [name] [name] is a 48 y.o. male with with Quadraplegia, pelvic osteomyelitis, mx decubitus ulcers with recent worsening drainage from ulcers last week and now foul smelling urine   #  1 Decubitus ulcers imaged with MRI and now pt found to have new abscesses on right near femur and ilium--likely what had previously suddenly started draining. Chronic osteomyelitis present: He is sp surgery yesterday by Dr. [name] with I and D and deep cultures Greatly appreciate Dr. [name] help with this patient.  --continue vanco/rocephin + po flagyl pending culture data to adjust --will plan on 8 weeks of therapy post op  #2 UTI: finished treatment   I will arrange for followup in my clnic in 6 weeks.    LOS: 10 days   Alcide Evener 12/31/2013, 2:20 PM

## 2014-01-01 ENCOUNTER — Encounter (HOSPITAL_COMMUNITY): Payer: Self-pay | Admitting: Plastic Surgery

## 2014-01-01 NOTE — Progress Notes (Signed)
[place] for Infectious Disease  # 3 vancomycin # 3 rocephin  # 3 flagyl  Subjective: [name]   Antibiotics:  Anti-infectives   Start     Dose/Rate Route Frequency Ordered Stop   12/31/13 0600  vancomycin (VANCOCIN) IVPB 1000 mg/200 mL premix     1,000 mg 200 mL/hr over 60 Minutes Intravenous Every 12 hours 12/30/13 1612     12/30/13 1700  vancomycin (VANCOCIN) 2,500 mg in sodium chloride 0.9 % 500 mL IVPB     2,500 mg 250 mL/hr over 120 Minutes Intravenous  Once 12/30/13 1612 12/30/13 1932   12/30/13 1600  cefTRIAXone (ROCEPHIN) 2 g in dextrose 5 % 50 mL IVPB     2 g 100 mL/hr over 30 Minutes Intravenous Every 24 hours 12/30/13 1538     12/30/13 1545  metroNIDAZOLE (FLAGYL) tablet 500 mg     500 mg Oral 3 times per day 12/30/13 1538     12/30/13 1325  polymyxin B 500,000 Units, bacitracin 50,000 Units in sodium chloride irrigation 0.9 % 500 mL irrigation  Status:  Discontinued       As needed 12/30/13 1325 12/30/13 1417   12/28/13 0600  ceFAZolin (ANCEF) IVPB 2 g/50 mL premix     2 g 100 mL/hr over 30 Minutes Intravenous On call to O.R. 12/27/13 1621 12/29/13 0559   12/22/13 1400  vancomycin (VANCOCIN) 1,250 mg in sodium chloride 0.9 % 250 mL IVPB  Status:  Discontinued     1,250 mg 166.7 mL/hr over 90 Minutes Intravenous Every 12 hours 12/22/13 0201 12/22/13 1132   12/22/13 0800  piperacillin-tazobactam (ZOSYN) IVPB 3.375 g  Status:  Discontinued     3.375 g 12.5 mL/hr over 240 Minutes Intravenous Every 8 hours 12/22/13 0146 12/25/13 1628   12/22/13 0600  piperacillin-tazobactam (ZOSYN) IVPB 3.375 g  Status:  Discontinued     3.375 g 100 mL/hr over 30 Minutes Intravenous 3 times per day 12/22/13 0129 12/22/13 0146   12/22/13 0145  vancomycin (VANCOCIN) 2,500 mg in sodium chloride 0.9 % 500 mL IVPB     2,500 mg 250 mL/hr over 120 Minutes Intravenous  Once 12/22/13 0135 12/22/13 0428   12/21/13 2230  piperacillin-tazobactam (ZOSYN) IVPB 3.375 g     3.375  g 100 mL/hr over 30 Minutes Intravenous  Once 12/21/13 2216 12/22/13 1216   12/21/13 2215  vancomycin (VANCOCIN) IVPB 1000 mg/200 mL premix  Status:  Discontinued     1,000 mg 200 mL/hr over 60 Minutes Intravenous  Once 12/21/13 2211 12/22/13 0133      Medications: Scheduled Meds: . amLODipine  2.5 mg Oral q morning - 10a  . baclofen  20 mg Oral QID  . cefTRIAXone (ROCEPHIN)  IV  2 g Intravenous Q24H  . collagenase   Topical Daily  . docusate sodium  100 mg Oral BID  . enoxaparin (LOVENOX) injection  50 mg Subcutaneous Daily  . famotidine  20 mg Oral BID  . feeding supplement (ENSURE COMPLETE)  237 mL Oral BID BM  . feeding supplement (PRO-STAT SUGAR FREE 64)  30 mL Oral BID WC  . metoCLOPramide  10 mg Oral TID AC  . metroNIDAZOLE  500 mg Oral 3 times per day  . multivitamin with minerals  1 tablet Oral q morning - 10a  . nutrition supplement (JUVEN)  1 packet Oral BID BM  . potassium chloride SA  20 mEq Oral BID  . sodium chloride  250 mL Intravenous Once  .  vancomycin  1,000 mg Intravenous Q12H  . vitamin C  500 mg Oral q morning - 10a  . zinc sulfate  220 mg Oral q morning - 10a   Continuous Infusions: . sodium chloride Stopped (12/22/13 0245)  . sodium chloride 75 mL/hr at 01/01/14 1112   PRN Meds:.acetaminophen, acetaminophen, alum & mag hydroxide-simeth, HYDROmorphone (DILAUDID) injection, ondansetron (ZOFRAN) IV, ondansetron, oxyCODONE, phenol, sodium chloride    Objective: Weight change:   Intake/Output Summary (Last 24 hours) at 01/01/14 1603 Last data filed at 01/01/14 1505  Gross per 24 hour  Intake   3150 ml  Output   6075 ml  Net  -2925 ml   Blood pressure 102/64, pulse 100, temperature 98.2 F (36.8 C), temperature source Oral, resp. rate 20, height 6' (1.829 m), weight 226 lb (102.513 kg), SpO2 100.00%. Temp:  [98.2 F (36.8 C)-98.9 F (37.2 C)] 98.2 F (36.8 C) (08/19 1505) Pulse Rate:  [84-100] 100 (08/19 1505) Resp:  [16-20] 20 (08/19 1505) BP:  (102-151)/(53-83) 102/64 mmHg (08/19 1505) SpO2:  [99 %-100 %] 100 % (08/19 1505)  Physical Exam: General: Alert and awake, oriented x3, not in any acute distress. HEENT: anicteric sclera, EOMI CVS regular rate, normal  Chest: No wheezing no respiratory distress Abdomen: soft nontender, nondistended, normal bowel sounds, Skin: wound vaccums in place  Neuro: Quadriplegic  CBC:  Recent Labs Lab 12/26/13 0500 12/28/13 0500 12/31/13 0504  HGB 7.3* 7.2* 7.3*  HCT 24.3* 23.8* 24.3*  PLT 428* 459* 472*     BMET  Recent Labs  12/31/13 0504  NA 141  K 4.0  CL 105  CO2 27  GLUCOSE 113*  BUN 16  CREATININE 0.50  CALCIUM 8.7     Liver Panel  No results found for this basename: PROT, ALBUMIN, AST, ALT, ALKPHOS, BILITOT, BILIDIR, IBILI,  in the last 72 hours     Sedimentation Rate No results found for this basename: ESRSEDRATE,  in the last 72 hours C-Reactive Protein No results found for this basename: CRP,  in the last 72 hours  Micro Results: Recent Results (from the past 720 hour(s))  CULTURE, BLOOD (ROUTINE X 2)     Status: None   Collection Time    12/21/13 11:35 PM      Result Value Ref Range Status   Specimen Description BLOOD LEFT HAND   Final   Special Requests BOTTLES DRAWN AEROBIC ONLY 5CC   Final   Culture  Setup Time     Final   Value: 12/22/2013 13:02     Performed at [facility]   Culture     Final   Value: NO GROWTH 5 DAYS     Performed at [facility]   Report Status 12/28/2013 FINAL   Final  CULTURE, BLOOD (ROUTINE X 2)     Status: None   Collection Time    12/21/13 11:40 PM      Result Value Ref Range Status   Specimen Description BLOOD LEFT HAND   Final   Special Requests BOTTLES DRAWN AEROBIC ONLY 5CC   Final   Culture  Setup Time     Final   Value: 12/22/2013 13:02     Performed at [place]     Final   Value: NO GROWTH 5 DAYS     Performed at [facility]   Report Status 12/28/2013  FINAL   Final  CULTURE, BLOOD (ROUTINE X 2)     Status: None  Collection Time    12/22/13  6:30 PM      Result Value Ref Range Status   Specimen Description BLOOD RIGHT LINE  5 ML IN [facility] BOTTLE   Final   Special Requests Immunocompromised   Final   Culture  Setup Time     Final   Value: 12/23/2013 00:06     Performed at [facility]   Culture     Final   Value: NO GROWTH 5 DAYS     Performed at [facility]   Report Status 12/29/2013 FINAL   Final  URINE CULTURE     Status: None   Collection Time    12/23/13  6:21 PM      Result Value Ref Range Status   Specimen Description URINE, CATHETERIZED   Final   Special Requests Immunocompromised   Final   Culture  Setup Time     Final   Value: 12/24/2013 00:28     Performed at [facility]     Final   Value: 25,000 COLONIES/ML     Performed at [facility]   Culture     Final   Value: PSEUDOMONAS AERUGINOSA     Performed at [facility]   Report Status 12/26/2013 FINAL   Final   Organism ID, Bacteria PSEUDOMONAS AERUGINOSA   Final  ANAEROBIC CULTURE     Status: None   Collection Time    12/30/13  1:50 PM      Result Value Ref Range Status   Specimen Description SACRAL RIGHT ISCHIUM   Final   Special Requests NONE   Final   Gram Stain     Final   Value: NO WBC SEEN     NO SQUAMOUS EPITHELIAL CELLS SEEN     NO ORGANISMS SEEN     Performed at [facility]   Culture     Final   Value: NO ANAEROBES ISOLATED; CULTURE IN PROGRESS FOR 5 DAYS     Performed at [facility]   Report Status PENDING   Incomplete  CULTURE, ROUTINE-ABSCESS     Status: None   Collection Time    12/30/13  1:50 PM      Result Value Ref Range Status   Specimen Description SACRAL RIGHT ISCHIUM   Final   Special Requests NONE   Final   Gram Stain     Final   Value: NO WBC SEEN     NO SQUAMOUS EPITHELIAL CELLS SEEN     NO ORGANISMS SEEN     Performed at [facility]   Culture      Final   Value: Culture reincubated for better growth     Performed at [facility]   Report Status PENDING   Incomplete    Studies/Results: No results found.    Assessment/Plan:  Principal Problem:   Infected decubitus ulcer Active Problems:   Quadriplegia   Seizure disorder   HTN (hypertension)   OSA on CPAP   Anemia   UTI (lower urinary tract infection)   Wound infection   Decubitus ulcer of ischium, stage 3   Decubitus ulcer of sacral region, stage 3   Decubitus ulcer of back, stage 3   Decubitus ulcer of lower extremity, stage 2    [name] [name] is a 48 y.o. male with with Quadraplegia, pelvic osteomyelitis, mx decubitus ulcers with recent worsening drainage from ulcers last week and now foul smelling urine   #  1 Decubitus ulcers imaged with MRI and now pt found to have new abscesses on right near femur and ilium--likely what had previously suddenly started draining. Chronic osteomyelitis present: He is sp surgery yesterday by Dr. [name] with I and D and deep cultures Greatly appreciate Dr. [name] help with this patient.  Spoke with lab and pt likelly growing Coag NEg staph, perhaps GNR (not a pseudomonad) and possibly Enterococcus  --continue vanco/rocephin + po flagyl pending culture data to adjust --will plan on 8 weeks of therapy post op  #2 UTI: finished treatment   I will arrange for followup in my clnic in 6 weeks.    LOS: 11 days   [name] [name] 01/01/2014, 4:03 PM

## 2014-01-01 NOTE — Progress Notes (Signed)
ANTIBIOTIC CONSULT NOTE - Follow up  Pharmacy Consult for Vancomycin Indication: Osteomyelitis  Allergies  Allergen Reactions  . Ditropan [Oxybutynin] Other (See Comments)    hallucinations    Patient Measurements: Height: 6' (182.9 cm) Weight: 226 lb (102.513 kg) IBW/kg (Calculated) : 77.6 Adjusted Body Weight: 88kg  Assessment: [name] [name] with quadriplegia and known osteomyelitis admitted 8/9 with sepsis 2/2 possible pyelonephritis vs new pelvic abscess.  Pt off antibiotics since 8/12 in attempts to improve microbiology collection today (8/17) during bone biopsy and I/D of bilateral stage 3 and 4 ulcers and wound vac placement.  Pharmacy consulted to dose vancomycin and pt also started on ceftriaxone and flagyl for osteomyelitis.    MRI on 8/12 showed new fluid collections in the right buttock, highly suspicious for abscesses, as well as sacral decubitus ulcer without underlying abscess, and also osteolysis and chronic osteomyelitis of both hips. Chronic osteomyelitis deep to the patient's sacral decubitus ulcer is also identified  Antiinfectives 8/9 >> vancomycin >> 8/9 8/9 >> zosyn >> 8/12 8/12 >> fosfomycin x 1 8/17 >> vancomycin >> 8/17 >> ceftriaxone >> 8/17 >> flagyl >>  Labs / vitals Tmax: remains afebrile WBCs: improved to WNL Renal: SCr low/stable, CrCl > 100 ml/min CG/N  Microbiology 8/8 UA: grossly positive for UTI 8/8 blood x2: ngf 8/9 blood: ngf 8/10 urine: 25k Pseudomonas RESISTANT to zosyn (sensitive only to AG, imip) 8/17 abscess: multiple organisms present, none predominant  8/19: D3 vancomycin restart, CTX 2g q24h (MD), flagyl q8h (MD) for osteomyelitis. S/p I&D of sacral wound, bone biopsy, VAC placement.. Renal function stable. F/u abscess culture. Zosyn d/c'd 8/12 in attempts to reduce systemic antibiotics so culture will grow organism. S/p fosfomycin x1 for UTI.   Goal of Therapy:  Vancomycin trough level 15-20 mcg/ml  Plan:  - continue vancomycin  1g IV q12h - vancomycin trough in AM as patient will receive several weeks of therapy - F/u renal fxn, cultures/biopsy, clinical course  Thank you for the consult.  Currie Paris, PharmD, BCPS Pager: [phone] Pharmacy: [phone] 01/01/2014 1:05 PM

## 2014-01-01 NOTE — Progress Notes (Signed)
When RN went in to assess patient at 2130, patient's pads were soaked through with blood. Vitals were taken and BP was 88/46 and HR was 96. Patient stated last time the pads had been changed was before noon. NP on call was notified and no new orders were given.

## 2014-01-01 NOTE — Progress Notes (Signed)
Mod amt of bloody drainage from hip/buttocks wound. Packed wet to dry and covered with ABD pad.

## 2014-01-01 NOTE — Progress Notes (Signed)
Patient ID: [name] [name], male   DOB: 03-Oct-1965, 48 y.o.   MRN: [id number]  [facility] PROGRESS NOTE  Schyler Counsell KDX:[id number] DOB: February 16, 1966 DOA: 12/21/2013 PCP: [name], MD  Brief narrative: 48 year old with C5 quadriplegia with nonhealing sacral wound stage IV, recurrent osteomyelitis, seizure disorder, anemia, OSA on CPAP, history of suprapubic catheter along with colostomy was admitted on 12/22/13 with sepsis secondary to possible pyelonephritis versus new pelvic abscess/ osteomyelitis.   Assessment and Plan:   Sepsis on admission   Possibly secondary to new pelvic abscesses as seen on MRI as well as UTI.   MRI pelvis done on 8/13 with new fluid collection adjacent to right ilium, proximal right femur, ? New abscess  Appreciate ID and plastic sx recommendation.   Patient taken to OR on 8/17 for debridement of sacral / ischial and right shoulder ulcer  Wound VAC placed. Deep tissue cultures sent.Final culture report pending   Started on empiric vancomycin, Rocephin and Flagyl by ID on 8/17.  UTI   Urine cx growing 25k pseudomonas resistant to zosyn. follow ID recommendations. .   Given a dose of fosfomycin on 8/12. off zosyn .  OSA   Cont bedtime CPAP  Microcytic anemia   Last H August 18th, 2015 was 7.3, will repeat CBC in AM  Transfuse if Hg < 7 C5 quadriplegia   Stable. Has deep sacral decub with suprapubic catheter and colostomy status  Hypertension   Continue low dose amlodipine.  Protein calorie malnutrition   Continue supplements   DVT prophylaxis  Diet: Regular    IV Access:   Peripheral IV Procedures and diagnostic studies:    Debridement as noted above  Medical Consultants:   Plastic sx (Dr. [name])  ID (Dr. Drucilla Schmidt) Other Consultants:   Physical therapy  Anti-Infectives:    IV zosyn (8/8-8/12)   IV vancomycin, Rocephin and Flagyl (8/ 17-->  Code Status: Full Family Communication: Pt at bedside Disposition Plan: Home when  final blood cultures are back   HPI/Subjective: No events overnight.   Objective: Filed Vitals:   01/01/14 0554 01/01/14 1020 01/01/14 1042 01/01/14 1505  BP: 151/83 117/70 117/70 102/64  Pulse: 84 88  100  Temp: 98.2 F (36.8 C) 98.2 F (36.8 C)  98.2 F (36.8 C)  TempSrc: Oral Oral  Oral  Resp: 20 16  20   Height:      Weight:      SpO2: 100% 99%  100%    Intake/Output Summary (Last 24 hours) at 01/01/14 1641 Last data filed at 01/01/14 1505  Gross per 24 hour  Intake   3150 ml  Output   6075 ml  Net  -2925 ml    Exam:   General:  Pt is alert, follows commands appropriately, not in acute distress  Cardiovascular: Regular rate and rhythm, no rubs, no gallops  Respiratory: Clear to auscultation bilaterally, no wheezing, diminished breath sounds at bases   Abdomen: Soft, non tender, non distended, bowel sounds present, no guarding  Data Reviewed: Basic Metabolic Panel:  Recent Labs Lab 12/31/13 0504  NA 141  K 4.0  CL 105  CO2 27  GLUCOSE 113*  BUN 16  CREATININE 0.50  CALCIUM 8.7   CBC:  Recent Labs Lab 12/26/13 0500 12/28/13 0500 12/31/13 0504  WBC 10.1 10.6* 9.8  HGB 7.3* 7.2* 7.3*  HCT 24.3* 23.8* 24.3*  MCV 72.5* 73.0* 74.8*  PLT 428* 459* 472*    Recent Results (from the past 240 hour(s))  CULTURE, BLOOD (  ROUTINE X 2)     Status: None   Collection Time    12/22/13  6:30 PM      Result Value Ref Range Status   Specimen Description BLOOD RIGHT LINE  5 ML IN [facility] BOTTLE   Final   Special Requests Immunocompromised   Final   Culture  Setup Time     Final   Value: 12/23/2013 00:06     Performed at [facility]   Culture     Final   Value: NO GROWTH 5 DAYS     Performed at [facility]   Report Status 12/29/2013 FINAL   Final  URINE CULTURE     Status: None   Collection Time    12/23/13  6:21 PM      Result Value Ref Range Status   Specimen Description URINE, CATHETERIZED   Final   Special Requests Immunocompromised    Final   Culture  Setup Time     Final   Value: 12/24/2013 00:28     Performed at [facility] Count     Final   Value: 25,000 COLONIES/ML     Performed at [facility]   Culture     Final   Value: PSEUDOMONAS AERUGINOSA     Performed at [facility]   Report Status 12/26/2013 FINAL   Final   Organism ID, Bacteria PSEUDOMONAS AERUGINOSA   Final  ANAEROBIC CULTURE     Status: None   Collection Time    12/30/13  1:50 PM      Result Value Ref Range Status   Specimen Description SACRAL RIGHT ISCHIUM   Final   Special Requests NONE   Final   Gram Stain     Final   Value: NO WBC SEEN     NO SQUAMOUS EPITHELIAL CELLS SEEN     NO ORGANISMS SEEN     Performed at [facility]   Culture     Final   Value: NO ANAEROBES ISOLATED; CULTURE IN PROGRESS FOR 5 DAYS     Performed at [facility]   Report Status PENDING   Incomplete  CULTURE, ROUTINE-ABSCESS     Status: None   Collection Time    12/30/13  1:50 PM      Result Value Ref Range Status   Specimen Description SACRAL RIGHT ISCHIUM   Final   Special Requests NONE   Final   Gram Stain     Final   Value: NO WBC SEEN     NO SQUAMOUS EPITHELIAL CELLS SEEN     NO ORGANISMS SEEN     Performed at [facility]   Culture     Final   Value: Culture reincubated for better growth     Performed at [facility]   Report Status PENDING   Incomplete     Scheduled Meds: . amLODipine  2.5 mg Oral q morning - 10a  . baclofen  20 mg Oral QID  . cefTRIAXone (ROCEPHIN)  IV  2 g Intravenous Q24H  . collagenase   Topical Daily  . docusate sodium  100 mg Oral BID  . enoxaparin (LOVENOX) injection  50 mg Subcutaneous Daily  . famotidine  20 mg Oral BID  . feeding supplement (ENSURE COMPLETE)  237 mL Oral BID BM  . feeding supplement (PRO-STAT SUGAR FREE 64)  30 mL Oral BID WC  . metoCLOPramide  10 mg Oral TID AC  .  metroNIDAZOLE  500 mg Oral 3 times per day  . multivitamin with  minerals  1 tablet Oral q morning - 10a  . nutrition supplement (JUVEN)  1 packet Oral BID BM  . potassium chloride SA  20 mEq Oral BID  . sodium chloride  250 mL Intravenous Once  . vancomycin  1,000 mg Intravenous Q12H  . vitamin C  500 mg Oral q morning - 10a  . zinc sulfate  220 mg Oral q morning - 10a   Continuous Infusions: . sodium chloride Stopped (12/22/13 0245)  . sodium chloride 75 mL/hr at 01/01/14 1112     Faye Ramsay, MD  [facility] Pager [phone]  If 7PM-7AM, please contact night-coverage [email or web address] Password [email or web address] 01/01/2014, 4:41 PM   LOS: 11 days

## 2014-01-02 DIAGNOSIS — R3919 Other difficulties with micturition: Secondary | ICD-10-CM

## 2014-01-02 LAB — CBC
HCT: 17.2 % — ABNORMAL LOW (ref 39.0–52.0)
HCT: 25.8 % — ABNORMAL LOW (ref 39.0–52.0)
HEMOGLOBIN: 8.3 g/dL — AB (ref 13.0–17.0)
Hemoglobin: 5.1 g/dL — CL (ref 13.0–17.0)
MCH: 22.1 pg — ABNORMAL LOW (ref 26.0–34.0)
MCH: 24.6 pg — AB (ref 26.0–34.0)
MCHC: 29.7 g/dL — ABNORMAL LOW (ref 30.0–36.0)
MCHC: 32.2 g/dL (ref 30.0–36.0)
MCV: 74.5 fL — ABNORMAL LOW (ref 78.0–100.0)
MCV: 76.6 fL — ABNORMAL LOW (ref 78.0–100.0)
PLATELETS: 451 10*3/uL — AB (ref 150–400)
Platelets: 409 10*3/uL — ABNORMAL HIGH (ref 150–400)
RBC: 2.31 MIL/uL — AB (ref 4.22–5.81)
RBC: 3.37 MIL/uL — ABNORMAL LOW (ref 4.22–5.81)
RDW: 19.9 % — AB (ref 11.5–15.5)
RDW: 19 % — AB (ref 11.5–15.5)
WBC: 11.4 10*3/uL — ABNORMAL HIGH (ref 4.0–10.5)
WBC: 13.5 10*3/uL — ABNORMAL HIGH (ref 4.0–10.5)

## 2014-01-02 LAB — BASIC METABOLIC PANEL
ANION GAP: 8 (ref 5–15)
BUN: 22 mg/dL (ref 6–23)
CO2: 26 mEq/L (ref 19–32)
Calcium: 8.4 mg/dL (ref 8.4–10.5)
Chloride: 106 mEq/L (ref 96–112)
Creatinine, Ser: 0.5 mg/dL (ref 0.50–1.35)
GFR calc Af Amer: >90 mL/min (ref >90)
GFR calc non Af Amer: >90 mL/min (ref >90)
Glucose, Bld: 99 mg/dL (ref 70–99)
POTASSIUM: 4.3 meq/L (ref 3.7–5.3)
SODIUM: 140 meq/L (ref 137–147)

## 2014-01-02 LAB — HEMOGLOBIN AND HEMATOCRIT, BLOOD
HCT: 17.8 % — ABNORMAL LOW (ref 39.0–52.0)
Hemoglobin: 5.4 g/dL — CL (ref 13.0–17.0)

## 2014-01-02 LAB — PREPARE RBC (CROSSMATCH)

## 2014-01-02 LAB — VANCOMYCIN, TROUGH: Vancomycin Tr: 13.5 ug/mL (ref 10.0–20.0)

## 2014-01-02 MED ORDER — VANCOMYCIN HCL 10 G IV SOLR
1250.0000 mg | Freq: Two times a day (BID) | INTRAVENOUS | Status: DC
  Administered 2014-01-02 – 2014-01-03 (×3): 1250 mg via INTRAVENOUS
  Filled 2014-01-02 (×4): qty 1250

## 2014-01-02 MED ORDER — SODIUM CHLORIDE 0.9 % IV SOLN: Freq: Once | INTRAVENOUS | Status: DC

## 2014-01-02 NOTE — Progress Notes (Signed)
Per MD orders pt. Was transfused 3 units of RBC's. Pt. Tolerated procedure well. Orders were placed post transfusion CBC. Will continue to monitor.

## 2014-01-02 NOTE — Progress Notes (Signed)
ANTIBIOTIC CONSULT NOTE - FOLLOW UP  Pharmacy Consult for Vancomycin Indication: Osteomyelitis  Allergies  Allergen Reactions  . Ditropan [Oxybutynin] Other (See Comments)    hallucinations    Patient Measurements: Height: 6' (182.9 cm) Weight: 226 lb (102.513 kg) IBW/kg (Calculated) : 77.6 Adjusted Body Weight:   Vital Signs: Temp: 98.2 F (36.8 C) (08/20 0205) Temp src: Axillary (08/20 0205) BP: 111/57 mmHg (08/20 0205) Pulse Rate: 86 (08/20 0205) Intake/Output from previous day: 08/19 0701 - 08/20 0700 In: 4573.8 [P.O.:2880; I.V.:1393.8; IV Piggyback:300] Out: 2350 [Urine:2200; Drains:50; Stool:100] Intake/Output from this shift: Total I/O In: 2533.8 [P.O.:1440; I.V.:793.8; IV Piggyback:300] Out: 700 [Urine:600; Stool:100]  Labs:  Recent Labs  12/31/13 0504 01/02/14 0500  WBC 9.8  --   HGB 7.3*  --   PLT 472*  --   CREATININE 0.50 0.50   Estimated Creatinine Clearance: 141.4 ml/min (by C-G formula based on Cr of 0.5).  Recent Labs  01/02/14 0500  VANCOTROUGH 13.5     Microbiology: Recent Results (from the past 720 hour(s))  CULTURE, BLOOD (ROUTINE X 2)     Status: None   Collection Time    12/21/13 11:35 PM      Result Value Ref Range Status   Specimen Description BLOOD LEFT HAND   Final   Special Requests BOTTLES DRAWN AEROBIC ONLY 5CC   Final   Culture  Setup Time     Final   Value: 12/22/2013 13:02     Performed at [facility]   Culture     Final   Value: NO GROWTH 5 DAYS     Performed at [facility]   Report Status 12/28/2013 FINAL   Final  CULTURE, BLOOD (ROUTINE X 2)     Status: None   Collection Time    12/21/13 11:40 PM      Result Value Ref Range Status   Specimen Description BLOOD LEFT HAND   Final   Special Requests BOTTLES DRAWN AEROBIC ONLY 5CC   Final   Culture  Setup Time     Final   Value: 12/22/2013 13:02     Performed at [facility]   Culture     Final   Value: NO GROWTH 5 DAYS     Performed  at [facility]   Report Status 12/28/2013 FINAL   Final  CULTURE, BLOOD (ROUTINE X 2)     Status: None   Collection Time    12/22/13  6:30 PM      Result Value Ref Range Status   Specimen Description BLOOD RIGHT LINE  5 ML IN [facility] BOTTLE   Final   Special Requests Immunocompromised   Final   Culture  Setup Time     Final   Value: 12/23/2013 00:06     Performed at [facility]   Culture     Final   Value: NO GROWTH 5 DAYS     Performed at [facility]   Report Status 12/29/2013 FINAL   Final  URINE CULTURE     Status: None   Collection Time    12/23/13  6:21 PM      Result Value Ref Range Status   Specimen Description URINE, CATHETERIZED   Final   Special Requests Immunocompromised   Final   Culture  Setup Time     Final   Value: 12/24/2013 00:28     Performed at [facility]  Final   Value: 25,000 COLONIES/ML     Performed at [facility]   Culture     Final   Value: PSEUDOMONAS AERUGINOSA     Performed at [facility]   Report Status 12/26/2013 FINAL   Final   Organism ID, Bacteria PSEUDOMONAS AERUGINOSA   Final  ANAEROBIC CULTURE     Status: None   Collection Time    12/30/13  1:50 PM      Result Value Ref Range Status   Specimen Description SACRAL RIGHT ISCHIUM   Final   Special Requests NONE   Final   Gram Stain     Final   Value: NO WBC SEEN     NO SQUAMOUS EPITHELIAL CELLS SEEN     NO ORGANISMS SEEN     Performed at [facility]   Culture     Final   Value: NO ANAEROBES ISOLATED; CULTURE IN PROGRESS FOR 5 DAYS     Performed at [facility]   Report Status PENDING   Incomplete  CULTURE, ROUTINE-ABSCESS     Status: None   Collection Time    12/30/13  1:50 PM      Result Value Ref Range Status   Specimen Description SACRAL RIGHT ISCHIUM   Final   Special Requests NONE   Final   Gram Stain     Final   Value: NO WBC SEEN     NO SQUAMOUS EPITHELIAL CELLS SEEN     NO ORGANISMS  SEEN     Performed at [facility]   Culture     Final   Value: Culture reincubated for better growth     Performed at [facility]   Report Status PENDING   Incomplete    Anti-infectives   Start     Dose/Rate Route Frequency Ordered Stop   12/31/13 0600  vancomycin (VANCOCIN) IVPB 1000 mg/200 mL premix     1,000 mg 200 mL/hr over 60 Minutes Intravenous Every 12 hours 12/30/13 1612     12/30/13 1700  vancomycin (VANCOCIN) 2,500 mg in sodium chloride 0.9 % 500 mL IVPB     2,500 mg 250 mL/hr over 120 Minutes Intravenous  Once 12/30/13 1612 12/30/13 1932   12/30/13 1600  cefTRIAXone (ROCEPHIN) 2 g in dextrose 5 % 50 mL IVPB     2 g 100 mL/hr over 30 Minutes Intravenous Every 24 hours 12/30/13 1538     12/30/13 1545  metroNIDAZOLE (FLAGYL) tablet 500 mg     500 mg Oral 3 times per day 12/30/13 1538     12/30/13 1325  polymyxin B 500,000 Units, bacitracin 50,000 Units in sodium chloride irrigation 0.9 % 500 mL irrigation  Status:  Discontinued       As needed 12/30/13 1325 12/30/13 1417   12/28/13 0600  ceFAZolin (ANCEF) IVPB 2 g/50 mL premix     2 g 100 mL/hr over 30 Minutes Intravenous On call to O.R. 12/27/13 1621 12/29/13 0559   12/22/13 1400  vancomycin (VANCOCIN) 1,250 mg in sodium chloride 0.9 % 250 mL IVPB  Status:  Discontinued     1,250 mg 166.7 mL/hr over 90 Minutes Intravenous Every 12 hours 12/22/13 0201 12/22/13 1132   12/22/13 0800  piperacillin-tazobactam (ZOSYN) IVPB 3.375 g  Status:  Discontinued     3.375 g 12.5 mL/hr over 240 Minutes Intravenous Every 8 hours 12/22/13 0146 12/25/13 1628   12/22/13 0600  piperacillin-tazobactam (ZOSYN) IVPB 3.375 g  Status:  Discontinued     3.375 g 100 mL/hr over 30 Minutes Intravenous 3 times per day 12/22/13 0129 12/22/13 0146   12/22/13 0145  vancomycin (VANCOCIN) 2,500 mg in sodium chloride 0.9 % 500 mL IVPB     2,500 mg 250 mL/hr over 120 Minutes Intravenous  Once 12/22/13 0135 12/22/13 0428   12/21/13 2230   piperacillin-tazobactam (ZOSYN) IVPB 3.375 g     3.375 g 100 mL/hr over 30 Minutes Intravenous  Once 12/21/13 2216 12/22/13 1216   12/21/13 2215  vancomycin (VANCOCIN) IVPB 1000 mg/200 mL premix  Status:  Discontinued     1,000 mg 200 mL/hr over 60 Minutes Intravenous  Once 12/21/13 2211 12/22/13 0133      Assessment: Patient with vancomycin level low on 1gm iv q12hr, prior doses charted correctly.  Goal of Therapy:  Vancomycin trough level 15-20 mcg/ml  Plan:  Measure antibiotic drug levels at steady state Follow up culture results Change to vancomycin 1250mg  iv q12hr  [name] [name], [name] [name] 01/02/2014,5:57 AM

## 2014-01-02 NOTE — Progress Notes (Signed)
CRITICAL VALUE ALERT  Critical value received: hgb 5.1  Date of notification:  01/02/14  Time of notification:  0605  Critical value read back:Yes.    Nurse who received alert:  Reynold Bowen, RN  MD notified (1st page):  Baltazar Najjar  Time of first page:  [phone]  MD notified (2nd page):  Time of second page:  Responding MD:  Baltazar Najjar  Time MD responded:  [phone]   New orders given for blood transfusion.

## 2014-01-02 NOTE — Progress Notes (Signed)
[facility]  # 4  vancomycin # 4 rocephin  # 4 flagyl  Subjective: [name] is receiving blood today   Antibiotics:  Anti-infectives   Start     Dose/Rate Route Frequency Ordered Stop   01/02/14 0600  vancomycin (VANCOCIN) 1,250 mg in sodium chloride 0.9 % 250 mL IVPB     1,250 mg 166.7 mL/hr over 90 Minutes Intravenous Every 12 hours 01/02/14 0559     12/31/13 0600  vancomycin (VANCOCIN) IVPB 1000 mg/200 mL premix  Status:  Discontinued     1,000 mg 200 mL/hr over 60 Minutes Intravenous Every 12 hours 12/30/13 1612 01/02/14 0559   12/30/13 1700  vancomycin (VANCOCIN) 2,500 mg in sodium chloride 0.9 % 500 mL IVPB     2,500 mg 250 mL/hr over 120 Minutes Intravenous  Once 12/30/13 1612 12/30/13 1932   12/30/13 1600  cefTRIAXone (ROCEPHIN) 2 g in dextrose 5 % 50 mL IVPB     2 g 100 mL/hr over 30 Minutes Intravenous Every 24 hours 12/30/13 1538     12/30/13 1545  metroNIDAZOLE (FLAGYL) tablet 500 mg     500 mg Oral 3 times per day 12/30/13 1538     12/30/13 1325  polymyxin B 500,000 Units, bacitracin 50,000 Units in sodium chloride irrigation 0.9 % 500 mL irrigation  Status:  Discontinued       As needed 12/30/13 1325 12/30/13 1417   12/28/13 0600  ceFAZolin (ANCEF) IVPB 2 g/50 mL premix     2 g 100 mL/hr over 30 Minutes Intravenous On call to O.R. 12/27/13 1621 12/29/13 0559   12/22/13 1400  vancomycin (VANCOCIN) 1,250 mg in sodium chloride 0.9 % 250 mL IVPB  Status:  Discontinued     1,250 mg 166.7 mL/hr over 90 Minutes Intravenous Every 12 hours 12/22/13 0201 12/22/13 1132   12/22/13 0800  piperacillin-tazobactam (ZOSYN) IVPB 3.375 g  Status:  Discontinued     3.375 g 12.5 mL/hr over 240 Minutes Intravenous Every 8 hours 12/22/13 0146 12/25/13 1628   12/22/13 0600  piperacillin-tazobactam (ZOSYN) IVPB 3.375 g  Status:  Discontinued     3.375 g 100 mL/hr over 30 Minutes Intravenous 3 times per day 12/22/13 0129 12/22/13 0146   12/22/13  0145  vancomycin (VANCOCIN) 2,500 mg in sodium chloride 0.9 % 500 mL IVPB     2,500 mg 250 mL/hr over 120 Minutes Intravenous  Once 12/22/13 0135 12/22/13 0428   12/21/13 2230  piperacillin-tazobactam (ZOSYN) IVPB 3.375 g     3.375 g 100 mL/hr over 30 Minutes Intravenous  Once 12/21/13 2216 12/22/13 1216   12/21/13 2215  vancomycin (VANCOCIN) IVPB 1000 mg/200 mL premix  Status:  Discontinued     1,000 mg 200 mL/hr over 60 Minutes Intravenous  Once 12/21/13 2211 12/22/13 0133      Medications: Scheduled Meds: . sodium chloride   Intravenous Once  . amLODipine  2.5 mg Oral q morning - 10a  . baclofen  20 mg Oral QID  . cefTRIAXone (ROCEPHIN)  IV  2 g Intravenous Q24H  . collagenase   Topical Daily  . docusate sodium  100 mg Oral BID  . enoxaparin (LOVENOX) injection  50 mg Subcutaneous Daily  . famotidine  20 mg Oral BID  . feeding supplement (ENSURE COMPLETE)  237 mL Oral BID BM  . feeding supplement (PRO-STAT SUGAR FREE 64)  30 mL Oral BID WC  . metoCLOPramide  10 mg Oral TID AC  .  metroNIDAZOLE  500 mg Oral 3 times per day  . multivitamin with minerals  1 tablet Oral q morning - 10a  . nutrition supplement (JUVEN)  1 packet Oral BID BM  . potassium chloride SA  20 mEq Oral BID  . sodium chloride  250 mL Intravenous Once  . vancomycin  1,250 mg Intravenous Q12H  . vitamin C  500 mg Oral q morning - 10a  . zinc sulfate  220 mg Oral q morning - 10a   Continuous Infusions:   PRN Meds:.acetaminophen, acetaminophen, alum & mag hydroxide-simeth, HYDROmorphone (DILAUDID) injection, ondansetron (ZOFRAN) IV, ondansetron, oxyCODONE, phenol, sodium chloride    Objective: Weight change:   Intake/Output Summary (Last 24 hours) at 01/02/14 1851 Last data filed at 01/02/14 1841  Gross per 24 hour  Intake 4672.5 ml  Output   4600 ml  Net   72.5 ml   Blood pressure 136/63, pulse 90, temperature 98.4 F (36.9 C), temperature source Oral, resp. rate 16, height 6' (1.829 m), weight 226  lb (102.513 kg), SpO2 100.00%. Temp:  [97.9 F (36.6 C)-98.5 F (36.9 C)] 98.4 F (36.9 C) (08/20 1532) Pulse Rate:  [84-98] 90 (08/20 1532) Resp:  [16-20] 16 (08/20 1532) BP: (85-136)/(41-73) 136/63 mmHg (08/20 1532) SpO2:  [98 %-100 %] 100 % (08/20 1532)  Physical Exam: General: Alert and awake, oriented x3, not in any acute distress. HEENT: anicteric sclera, EOMI CVS regular rate, normal  Chest: No wheezing no respiratory distress Abdomen: soft nontender, nondistended, normal bowel sounds, Skin: wound vaccums in place  Neuro: Quadriplegic  CBC:  Recent Labs Lab 12/28/13 0500 12/31/13 0504 01/02/14 0500 01/02/14 0625 01/02/14 1645  HGB 7.2* 7.3* 5.1* 5.4* 8.3*  HCT 23.8* 24.3* 17.2* 17.8* 25.8*  PLT 459* 472* 409*  --  451*     BMET  Recent Labs  12/31/13 0504 01/02/14 0500  NA 141 140  K 4.0 4.3  CL 105 106  CO2 27 26  GLUCOSE 113* 99  BUN 16 22  CREATININE 0.50 0.50  CALCIUM 8.7 8.4     Liver Panel  No results found for this basename: PROT, ALBUMIN, AST, ALT, ALKPHOS, BILITOT, BILIDIR, IBILI,  in the last 72 hours     Sedimentation Rate No results found for this basename: ESRSEDRATE,  in the last 72 hours C-Reactive Protein No results found for this basename: CRP,  in the last 72 hours  Micro Results: Recent Results (from the past 720 hour(s))  CULTURE, BLOOD (ROUTINE X 2)     Status: None   Collection Time    12/21/13 11:35 PM      Result Value Ref Range Status   Specimen Description BLOOD LEFT HAND   Final   Special Requests BOTTLES DRAWN AEROBIC ONLY 5CC   Final   Culture  Setup Time     Final   Value: 12/22/2013 13:02     Performed at [facility]   Culture     Final   Value: NO GROWTH 5 DAYS     Performed at [facility]   Report Status 12/28/2013 FINAL   Final  CULTURE, BLOOD (ROUTINE X 2)     Status: None   Collection Time    12/21/13 11:40 PM      Result Value Ref Range Status   Specimen Description BLOOD  LEFT HAND   Final   Special Requests BOTTLES DRAWN AEROBIC ONLY 5CC   Final   Culture  Setup Time  Final   Value: 12/22/2013 13:02     Performed at [facility]   Culture     Final   Value: NO GROWTH 5 DAYS     Performed at [facility]   Report Status 12/28/2013 FINAL   Final  CULTURE, BLOOD (ROUTINE X 2)     Status: None   Collection Time    12/22/13  6:30 PM      Result Value Ref Range Status   Specimen Description BLOOD RIGHT LINE  5 ML IN [facility] BOTTLE   Final   Special Requests Immunocompromised   Final   Culture  Setup Time     Final   Value: 12/23/2013 00:06     Performed at [facility]   Culture     Final   Value: NO GROWTH 5 DAYS     Performed at [facility]   Report Status 12/29/2013 FINAL   Final  URINE CULTURE     Status: None   Collection Time    12/23/13  6:21 PM      Result Value Ref Range Status   Specimen Description URINE, CATHETERIZED   Final   Special Requests Immunocompromised   Final   Culture  Setup Time     Final   Value: 12/24/2013 00:28     Performed at [facility]     Final   Value: 25,000 COLONIES/ML     Performed at [facility]   Culture     Final   Value: PSEUDOMONAS AERUGINOSA     Performed at [facility]   Report Status 12/26/2013 FINAL   Final   Organism ID, Bacteria PSEUDOMONAS AERUGINOSA   Final  ANAEROBIC CULTURE     Status: None   Collection Time    12/30/13  1:50 PM      Result Value Ref Range Status   Specimen Description SACRAL RIGHT ISCHIUM   Final   Special Requests NONE   Final   Gram Stain     Final   Value: NO WBC SEEN     NO SQUAMOUS EPITHELIAL CELLS SEEN     NO ORGANISMS SEEN     Performed at [facility]   Culture     Final   Value: NO ANAEROBES ISOLATED; CULTURE IN PROGRESS FOR 5 DAYS     Performed at [facility]   Report Status PENDING   Incomplete  CULTURE, ROUTINE-ABSCESS     Status: None   Collection Time     12/30/13  1:50 PM      Result Value Ref Range Status   Specimen Description SACRAL RIGHT ISCHIUM   Final   Special Requests NONE   Final   Gram Stain     Final   Value: NO WBC SEEN     NO SQUAMOUS EPITHELIAL CELLS SEEN     NO ORGANISMS SEEN     Performed at [facility]   Culture     Final   Value: FEW GRAM NEGATIVE RODS     FEW ENTEROCOCCUS SPECIES     Performed at [facility]   Report Status PENDING   Incomplete    Studies/Results: No results found.    Assessment/Plan:  Principal Problem:   Infected decubitus ulcer Active Problems:   Quadriplegia   Seizure disorder   HTN (hypertension)   OSA on CPAP   Anemia   UTI (lower urinary tract infection)  Wound infection   Decubitus ulcer of ischium, stage 3   Decubitus ulcer of sacral region, stage 3   Decubitus ulcer of back, stage 3   Decubitus ulcer of lower extremity, stage 2    [name] [name] is a 48 y.o. male with with Quadraplegia, pelvic osteomyelitis, mx decubitus ulcers with recent worsening drainage from ulcers last week and now foul smelling urine   #1 Decubitus ulcers imaged with MRI and now pt found to have new abscesses on right near femur and ilium--likely what had previously suddenly started draining. Chronic osteomyelitis present: He is sp surgery yesterday by Dr. [name] with I and D and deep cultures Greatly appreciate Dr. [name] help with this patient.  Spoke with lab and pt likelly growingGNR (not a pseudomonad) and Enterococcus  -IF he has AMP sensitive Enterococcus and GNR is unasyn sensitive, consider simplify to unasyn 3grams IV q 6 hours  --will plan on 8 weeks of therapy post op   I will arrange for followup in my clnic in 6 weeks.    LOS: 12 days   Alcide Evener 01/02/2014, 6:51 PM

## 2014-01-02 NOTE — Progress Notes (Signed)
RT placed patient on CPAP on 19 cmH2O.  Patient is tolerating the CPAP at this time. RT will continue to monitor.

## 2014-01-02 NOTE — Progress Notes (Addendum)
Patient ID: [name] [name], male   DOB: Jul 25, 1965, 48 y.o.   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 MRI pelvis 12/24/2013 showed new fluid collections in the right buttock highly suspicious for abscesses. Small fluid collection was also seen in the left hip worrisome for abscess. Sacral decubitus ulcer was without underlying abscess. Chronic osteomyelitis deep to the patient's sacral decubitus ulcer was also identified.Pt underwent debridement of sacral and ischial ulcers. ID is assisting with antibiotic management.   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 Small fluid collection was also seen in the left hip worrisome for abscess. Sacral decubitus ulcer was without underlying abscess. Chronic osteomyelitis deep to the patient's sacral decubitus ulcer was also identified.Patient underwent debridement of sacral and ischial ulcers by Dr. [name] on 12/30/2013 with wound VAC placement. Continue current antibiotic regimen as recommended by Dr. Drucilla Schmidt of ID. Vancomycin, rocephin and flagyl (started on 12/30/2013). Pending culutre data these abx may be adjusted. Plan at this time is to continue abx 8 weeks post- operatively.   Active Problems: Pseudomonas UTI   Pt completed treatment with zosyn.   Given a dose of fosfomycin on 8/12. off zosyn .  Obesity hypoventilation syndrome,  obstructive sleep apnea  Continue bedtime CPAP  Microcytic anemia   Hemoglobin drop noted 7.3 to 5.1  We will transfuse 3 units PRBC today C5 quadriplegia  Stable. Has deep sacral decub with suprapubic catheter and colostomy in place.  Hypertension  Continue low dose amlodipine.  Moderate protein calorie malnutrition  In the context of chronic illness, decubitus ulcers and osteomyelitis  Continue nutritional supplements  DVT prophylaxis   SCD's while pt is in hospital  Code Status: Full  Family Communication: family not at the bedside this am  Disposition Plan: Home when final blood cultures are back    IV Access:    Central line double lumen right IJ (100 days) Central line triple lumen Left IJ (10 days) Procedures and diagnostic studies:    Sacral (4 x 4 x .5 cm), ischial (4 x 3 x 10cm) ulcer- debridement soft tissue, skin, muscle and bone - 12/30/2013. Medical Consultants:   Plastic sx (Dr. [name])  ID (Dr. Flossie Dibble) Other Consultants:   Physical therapy  Anti-Infectives:    IV zosyn (8/8-8/12)   IV vancomycin, Rocephin and Flagyl 8/ 17-->  Faye Ramsay, MD  [facility] Pager [phone]  If 7PM-7AM, please contact night-coverage [email or web address] Password [facility] 01/02/2014, 9:47 AM   LOS: 12 days   HPI/Subjective: No events overnight.   Objective: Filed Vitals:   01/02/14 2585 01/02/14 0811 01/02/14 0834 01/02/14 0934  BP: 96/41 85/56 123/69 100/50  Pulse: 88 90 85 94  Temp: 97.9 F (36.6 C) 98.2 F (36.8 C) 98.5 F (36.9 C) 98.3 F (36.8 C)  TempSrc: Oral Oral Oral Oral  Resp: 16 16 16 16   Height:      Weight:      SpO2: 100% 99% 99% 100%    Intake/Output Summary (Last 24  hours) at 01/02/14 0947 Last data filed at 01/02/14 2633  Gross per 24 hour  Intake 4737.5 ml  Output   2800 ml  Net 1937.5 ml    Exam:   General:  Pt is alert, follows commands appropriately, not in acute distress  Cardiovascular: Regular rate and rhythm,  S1/S2, no murmurs, no rubs, no gallops  Respiratory: Clear to auscultation bilaterally, no wheezing, no crackles, no rhonchi  Abdomen: Soft, non tender, non distended, bowel sounds present, no guarding  Extremities: sacral decubitus ulcer, pulses DP and PT palpable bilaterally  Neuro: Grossly nonfocal  Data Reviewed: Basic Metabolic Panel:  Recent Labs Lab 12/31/13 0504 01/02/14 0500  NA 141 140  K 4.0 4.3  CL 105 106  CO2 27 26  GLUCOSE 113* 99  BUN 16 22  CREATININE 0.50 0.50  CALCIUM 8.7 8.4   Liver Function Tests: No results found for this basename: AST, ALT, ALKPHOS, BILITOT, PROT, ALBUMIN,  in the last 168 hours No results found for this basename: LIPASE, AMYLASE,  in the last 168 hours No results found for this basename: AMMONIA,  in the last 168 hours CBC:  Recent Labs Lab 12/28/13 0500 12/31/13 0504 01/02/14 0500 01/02/14 0625  WBC 10.6* 9.8 11.4*  --   HGB 7.2* 7.3* 5.1* 5.4*  HCT 23.8* 24.3* 17.2* 17.8*  MCV 73.0* 74.8* 74.5*  --   PLT 459* 472* 409*  --    Cardiac Enzymes: No results found for this basename: CKTOTAL, CKMB, CKMBINDEX, TROPONINI,  in the last 168 hours BNP: No components found with this basename: POCBNP,  CBG: No results found for this basename: GLUCAP,  in the last 168 hours  URINE CULTURE     Status: None   Collection Time    12/23/13  6:21 PM      Result Value Ref Range Status   Specimen Description URINE, CATHETERIZED   Final   Value: PSEUDOMONAS AERUGINOSA     Performed at [facility]   Report Status 12/26/2013 FINAL   Final   Organism ID, Bacteria PSEUDOMONAS AERUGINOSA   Final  ANAEROBIC CULTURE     Status: None   Collection Time    12/30/13  1:50 PM      Result Value Ref Range Status   Specimen Description SACRAL RIGHT ISCHIUM   Final     NO ORGANISMS SEEN     Performed at [facility]   Culture     Final   Value: NO ANAEROBES ISOLATED; CULTURE IN PROGRESS FOR 5 DAYS     Performed at [facility]   Report Status PENDING   Incomplete  CULTURE, ROUTINE-ABSCESS     Status: None   Collection Time    12/30/13  1:50 PM      Result Value Ref Range Status   Specimen Description SACRAL RIGHT ISCHIUM   Final     NO ORGANISMS SEEN     Performed at [facility]   Culture     Final   Value: Culture reincubated for better growth     Performed at [facility]   Report Status PENDING   Incomplete     Scheduled Meds: . amLODipine  2.5 mg Oral q morning - 10a  . baclofen  20 mg Oral QID  . (ROCEPHIN)  IV  2 g Intravenous Q24H  . collagenase   Topical Daily  . docusate sodium  100 mg Oral BID  . enoxaparin (LOVENOX)   50  mg Subcutaneous Daily  . famotidine  20 mg Oral BID  .  (ENSURE COMPLETE)  237 mL Oral BID BM  .  (PRO-STA  30 mL Oral BID WC  . metoCLOPramide  10 mg Oral TID AC  . metroNIDAZOLE  500 mg Oral 3 times per day  . multivitamin   1 tablet Oral q morning - 10a  . nutrition supplement  1 packet Oral BID BM  . potassium chloride SA  20 mEq Oral BID  . vancomycin  1,250 mg Intravenous Q12H  . vitamin C  500 mg Oral q morning - 10a  . zinc sulfate  220 mg Oral q morning - 10a   Continuous Infusions: . sodium chloride Stopped (12/22/13 0245)  . sodium chloride 75 mL/hr at 01/01/14 1112

## 2014-01-03 ENCOUNTER — Inpatient Hospital Stay (HOSPITAL_COMMUNITY): Payer: Medicare Other

## 2014-01-03 LAB — CBC
HEMATOCRIT: 25.6 % — AB (ref 39.0–52.0)
Hemoglobin: 8.2 g/dL — ABNORMAL LOW (ref 13.0–17.0)
MCH: 24.8 pg — AB (ref 26.0–34.0)
MCHC: 32 g/dL (ref 30.0–36.0)
MCV: 77.6 fL — AB (ref 78.0–100.0)
Platelets: 418 10*3/uL — ABNORMAL HIGH (ref 150–400)
RBC: 3.3 MIL/uL — ABNORMAL LOW (ref 4.22–5.81)
RDW: 19.2 % — ABNORMAL HIGH (ref 11.5–15.5)
WBC: 12.2 10*3/uL — ABNORMAL HIGH (ref 4.0–10.5)

## 2014-01-03 LAB — TYPE AND SCREEN
ABO/RH(D): B POS
ANTIBODY SCREEN: NEGATIVE
UNIT DIVISION: 0
UNIT DIVISION: 0
Unit division: 0

## 2014-01-03 LAB — BASIC METABOLIC PANEL
Anion gap: 8 (ref 5–15)
BUN: 16 mg/dL (ref 6–23)
CALCIUM: 8.9 mg/dL (ref 8.4–10.5)
CO2: 26 mEq/L (ref 19–32)
CREATININE: 0.43 mg/dL — AB (ref 0.50–1.35)
Chloride: 107 mEq/L (ref 96–112)
Glucose, Bld: 103 mg/dL — ABNORMAL HIGH (ref 70–99)
Potassium: 4.1 mEq/L (ref 3.7–5.3)
Sodium: 141 mEq/L (ref 137–147)

## 2014-01-03 MED ORDER — GENTAMICIN SULFATE 40 MG/ML IJ SOLN
600.0000 mg | INTRAVENOUS | Status: DC
  Administered 2014-01-03 – 2014-01-06 (×4): 600 mg via INTRAVENOUS
  Filled 2014-01-03 (×4): qty 15

## 2014-01-03 MED ORDER — TIGECYCLINE 50 MG IV SOLR
50.0000 mg | Freq: Two times a day (BID) | INTRAVENOUS | Status: DC
  Administered 2014-01-03 – 2014-01-06 (×6): 50 mg via INTRAVENOUS
  Filled 2014-01-03 (×7): qty 100

## 2014-01-03 MED ORDER — TIGECYCLINE 50 MG IV SOLR
100.0000 mg | Freq: Once | INTRAVENOUS | Status: AC
  Administered 2014-01-03: 100 mg via INTRAVENOUS
  Filled 2014-01-03: qty 100

## 2014-01-03 MED ORDER — LIDOCAINE HCL 1 % IJ SOLN
INTRAMUSCULAR | Status: AC
  Filled 2014-01-03: qty 20
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
## 2014-01-03 NOTE — Progress Notes (Signed)
RT placed pt on CPAP at 19cmH2O per home settings via ffm. Sterile water was added for humidification. Pt looks comfortable and is tolerating CPAP well at this time. RT will continue to monitor as needed.

## 2014-01-03 NOTE — Procedures (Signed)
Procedure:  Tunneled central venous catheter placement Access:  Right IJ vein 6 Fr, DL Power Line placed with tip at cavoatrial junction.  OK to use.

## 2014-01-03 NOTE — Progress Notes (Signed)
Patient ID: [name] [name], male   DOB: 1965/11/19, 48 y.o.   MRN: [id number] [facility] PROGRESS NOTE  [name] [name] [id number] DOB: 1966/03/09 DOA: 12/21/2013 PCP: [name], MD  Brief narrative: 48 year old with C5 quadriplegia with nonhealing sacral wound stage IV, recurrent osteomyelitis, seizure disorder, anemia, OSA on CPAP, history of suprapubic catheter along with colostomy who presented to [facility] on 12/22/13 with sepsis secondary to possible pyelonephritis versus new pelvic abscess/ osteomyelitis. MRI pelvis 12/24/2013 showed new fluid collections in the right buttock highly suspicious for abscesses. Small fluid collection was also seen in the left hip worrisome for abscess. Sacral decubitus ulcer was without underlying abscess. Chronic osteomyelitis deep to the patient's sacral decubitus ulcer was also identified.Pt underwent debridement of sacral and ischial ulcers. ID is assisting with antibiotic management.   Assessment and Plan:   Principal Problem:  Sepsis secondary to acute on chronic pelvic osteomyelitis and multiple decubitus ulcers  MRI pelvis was done 12/24/2013 with findings of new fluid collections in the right buttock highly suspicious for abscesses. Small fluid collection was also seen in the left hip worrisome for abscess. Sacral decubitus ulcer was without underlying abscess. Chronic osteomyelitis deep to the patient's sacral decubitus ulcer was also identified.Patient underwent debridement of sacral and ischial ulcers by Dr. [name] on 12/30/2013 with wound VAC placement. Wound culutre from sacral /right ischium abscess growing acinetobacter and enterococcus. Continue antibiotic regimen as recommended by Dr. Drucilla Schmidt of ID. Vancomycin, rocephin and flagyl, started on 12/30/2013 and stopped 01/03/2014 as sensitivity report from an abscess culture came with with intermediate sens to Unasyn for Acinetobacter. ID recommended tigecycline. Order in place. Pt requires 8 weeks of abx  therapy per ID recommendations.   Active Problems:  Pseudomonas UTI   Pt completed treatment with zosyn.   Given a dose of fosfomycin on 8/12. Now off zosyn .  Obesity hypoventilation syndrome, obstructive sleep apnea   Continue bedtime CPAP  Microcytic anemia   Hemoglobin drop noted 7.3 to 5.1 on 01/02/2014. Status post 3 units PRBC blood transfusion. With post- transfusion hemoglobin 8.3. C5 quadriplegia   Stable. Has deep sacral decub with suprapubic catheter and colostomy. Hypertension   Continue low dose amlodipine.  Moderate protein calorie malnutrition   In the context of chronic illness, decubitus ulcers and osteomyelitis   Continue nutritional supplements  DVT prophylaxis   SCD's while pt is in hospital due to risk of bleed, anemia   Code Status: Full  Family Communication: family not at the bedside this am  Disposition Plan: Home when stable    IV Access:    Central line triple lumen Left   Procedures and diagnostic studies:    Sacral (4 x 4 x .5 cm), ischial (4 x 3 x 10cm) ulcer- debridement soft tissue, skin, muscle and bone - 12/30/2013.  Medical Consultants:   Plastic sx (Dr. Lyndee Leo Sanger)  ID (Dr. Flossie Dibble)  Other Consultants:   Physical therapy   Anti-Infectives:    IV zosyn (8/8-8/12)   IV vancomycin, Rocephin and Flagyl 8/ 17--> 01/03/2014  Tigecycline 01/03/2014 -->   Faye Ramsay, MD  [facility] Pager [phone]  If 7PM-7AM, please contact night-coverage [email or web address] Password [email or web address] 01/03/2014, 10:40 AM   LOS: 13 days   HPI/Subjective: No events overnight.   Objective: Filed Vitals:   01/02/14 1447 01/02/14 1532 01/02/14 2030 01/03/14 0610  BP: 124/73 136/63 128/63 140/83  Pulse: 98 90 102 84  Temp: 98.2 F (36.8 C) 98.4 F (36.9 C) 98.5 F (36.9 C) 98.1 F (  36.7 C)  TempSrc: Oral Oral Oral Oral  Resp: 16 16 16 16   Height:      Weight:      SpO2: 100% 100% 98% 100%    Intake/Output Summary (Last 24 hours) at  01/03/14 1040 Last data filed at 01/03/14 1007  Gross per 24 hour  Intake   1345 ml  Output   6400 ml  Net  -5055 ml    Exam:   General:  Pt is not in acute distress  Cardiovascular: Regular rate and rhythm, S1/S2 appreciated   Respiratory: Clear to auscultation bilaterally, no wheezing, no crackles, no rhonchi  Abdomen: Soft, non tender, non distended, bowel sounds present, (+) colostomy, (+) suprapubic catheter   Extremities: sacral decubitus ulcer with wound vac in place  Neuro: Grossly nonfocal  Data Reviewed: Basic Metabolic Panel:  Recent Labs Lab 12/31/13 0504 01/02/14 0500 01/03/14 0515  NA 141 140 141  K 4.0 4.3 4.1  CL 105 106 107  CO2 27 26 26   GLUCOSE 113* 99 103*  BUN 16 22 16   CREATININE 0.50 0.50 0.43*  CALCIUM 8.7 8.4 8.9   Liver Function Tests: No results found for this basename: AST, ALT, ALKPHOS, BILITOT, PROT, ALBUMIN,  in the last 168 hours No results found for this basename: LIPASE, AMYLASE,  in the last 168 hours No results found for this basename: AMMONIA,  in the last 168 hours CBC:  Recent Labs Lab 12/28/13 0500 12/31/13 0504 01/02/14 0500 01/02/14 0625 01/02/14 1645 01/03/14 0515  WBC 10.6* 9.8 11.4*  --  13.5* 12.2*  HGB 7.2* 7.3* 5.1* 5.4* 8.3* 8.2*  HCT 23.8* 24.3* 17.2* 17.8* 25.8* 25.6*  MCV 73.0* 74.8* 74.5*  --  76.6* 77.6*  PLT 459* 472* 409*  --  451* 418*   Cardiac Enzymes: No results found for this basename: CKTOTAL, CKMB, CKMBINDEX, TROPONINI,  in the last 168 hours BNP: No components found with this basename: POCBNP,  CBG: No results found for this basename: GLUCAP,  in the last 168 hours  ANAEROBIC CULTURE     Status: None   Collection Time    12/30/13  1:50 PM      Result Value Ref Range Status   Specimen Description SACRAL RIGHT ISCHIUM   Final   Value: NO ANAEROBES ISOLATED; CULTURE IN PROGRESS FOR 5 DAYS     Performed at [facility]   Report Status PENDING   Incomplete  CULTURE,  ROUTINE-ABSCESS     Status: None   Collection Time    12/30/13  1:50 PM      Result Value Ref Range Status   Specimen Description SACRAL RIGHT ISCHIUM   Final   Value: FEW ACINETOBACTER CALCOACETICUS/BAUMANNII COMPLEX     FEW ENTEROCOCCUS SPECIES     Performed at [facility]   Report Status 01/03/2014 FINAL   Final     Scheduled Meds: . amLODipine  2.5 mg Oral q morning - 10a  . baclofen  20 mg Oral QID  . collagenase   Topical Daily  . docusate sodium  100 mg Oral BID  . enoxaparin (LOVENOX) i  50 mg Subcutaneous Daily  . famotidine  20 mg Oral BID  . feeding supplement   237 mL Oral BID BM  . feeding supplement   30 mL Oral BID WC  . metoCLOPramide  10 mg Oral TID AC  . multivitamin   1 tablet Oral q morning - 10a  . nutrition supplement   1 packet  Oral BID BM  . potassium chloride SA  20 mEq Oral BID  . tigecycline (TYGACIL) IVPB  100 mg Intravenous Once   Followed by  . tigecycline (TYGACIL)   50 mg Intravenous Q12H  . vitamin C  500 mg Oral q morning - 10a  . zinc sulfate  220 mg Oral q morning - 10a   Continuous Infusions:

## 2014-01-03 NOTE — Plan of Care (Signed)
Problem: Phase I Progression Outcomes Goal: Voiding-avoid urinary catheter unless indicated Outcome: Not Applicable Date Met:  21/79/81 Patient with chronic suprapubic catheter.

## 2014-01-03 NOTE — Progress Notes (Signed)
[place] [facility]  # 5 vancomycin # 5 rocephin  # 5 flagyl  Subjective: [name]    Antibiotics:  Anti-infectives   Start     Dose/Rate Route Frequency Ordered Stop   01/03/14 2156  tigecycline (TYGACIL) 50 mg in sodium chloride 0.9 % 100 mL IVPB     50 mg 200 mL/hr over 30 Minutes Intravenous Every 12 hours 01/03/14 0956     01/03/14 1200  gentamicin (GARAMYCIN) 600 mg in dextrose 5 % 100 mL IVPB     600 mg 115 mL/hr over 60 Minutes Intravenous Every 24 hours 01/03/14 1046     01/03/14 1000  tigecycline (TYGACIL) 100 mg in sodium chloride 0.9 % 100 mL IVPB     100 mg 200 mL/hr over 30 Minutes Intravenous  Once 01/03/14 0956 01/03/14 1133   01/02/14 0600  vancomycin (VANCOCIN) 1,250 mg in sodium chloride 0.9 % 250 mL IVPB  Status:  Discontinued     1,250 mg 166.7 mL/hr over 90 Minutes Intravenous Every 12 hours 01/02/14 0559 01/03/14 0956   12/31/13 0600  vancomycin (VANCOCIN) IVPB 1000 mg/200 mL premix  Status:  Discontinued     1,000 mg 200 mL/hr over 60 Minutes Intravenous Every 12 hours 12/30/13 1612 01/02/14 0559   12/30/13 1700  vancomycin (VANCOCIN) 2,500 mg in sodium chloride 0.9 % 500 mL IVPB     2,500 mg 250 mL/hr over 120 Minutes Intravenous  Once 12/30/13 1612 12/30/13 1932   12/30/13 1600  cefTRIAXone (ROCEPHIN) 2 g in dextrose 5 % 50 mL IVPB  Status:  Discontinued     2 g 100 mL/hr over 30 Minutes Intravenous Every 24 hours 12/30/13 1538 01/03/14 0956   12/30/13 1545  metroNIDAZOLE (FLAGYL) tablet 500 mg  Status:  Discontinued     500 mg Oral 3 times per day 12/30/13 1538 01/03/14 0956   12/30/13 1325  polymyxin B 500,000 Units, bacitracin 50,000 Units in sodium chloride irrigation 0.9 % 500 mL irrigation  Status:  Discontinued       As needed 12/30/13 1325 12/30/13 1417   12/28/13 0600  ceFAZolin (ANCEF) IVPB 2 g/50 mL premix     2 g 100 mL/hr over 30 Minutes Intravenous On call to O.R. 12/27/13 1621 12/29/13 0559   12/22/13  1400  vancomycin (VANCOCIN) 1,250 mg in sodium chloride 0.9 % 250 mL IVPB  Status:  Discontinued     1,250 mg 166.7 mL/hr over 90 Minutes Intravenous Every 12 hours 12/22/13 0201 12/22/13 1132   12/22/13 0800  piperacillin-tazobactam (ZOSYN) IVPB 3.375 g  Status:  Discontinued     3.375 g 12.5 mL/hr over 240 Minutes Intravenous Every 8 hours 12/22/13 0146 12/25/13 1628   12/22/13 0600  piperacillin-tazobactam (ZOSYN) IVPB 3.375 g  Status:  Discontinued     3.375 g 100 mL/hr over 30 Minutes Intravenous 3 times per day 12/22/13 0129 12/22/13 0146   12/22/13 0145  vancomycin (VANCOCIN) 2,500 mg in sodium chloride 0.9 % 500 mL IVPB     2,500 mg 250 mL/hr over 120 Minutes Intravenous  Once 12/22/13 0135 12/22/13 0428   12/21/13 2230  piperacillin-tazobactam (ZOSYN) IVPB 3.375 g     3.375 g 100 mL/hr over 30 Minutes Intravenous  Once 12/21/13 2216 12/22/13 1216   12/21/13 2215  vancomycin (VANCOCIN) IVPB 1000 mg/200 mL premix  Status:  Discontinued     1,000 mg 200 mL/hr over 60 Minutes Intravenous  Once 12/21/13 2211 12/22/13 0133  Medications: Scheduled Meds: . sodium chloride   Intravenous Once  . amLODipine  2.5 mg Oral q morning - 10a  . baclofen  20 mg Oral QID  . collagenase   Topical Daily  . docusate sodium  100 mg Oral BID  . enoxaparin (LOVENOX) injection  50 mg Subcutaneous Daily  . famotidine  20 mg Oral BID  . feeding supplement (ENSURE COMPLETE)  237 mL Oral BID BM  . feeding supplement (PRO-STAT SUGAR FREE 64)  30 mL Oral BID WC  . gentamicin  600 mg Intravenous Q24H  . metoCLOPramide  10 mg Oral TID AC  . multivitamin with minerals  1 tablet Oral q morning - 10a  . nutrition supplement (JUVEN)  1 packet Oral BID BM  . potassium chloride SA  20 mEq Oral BID  . sodium chloride  250 mL Intravenous Once  . tigecycline (TYGACIL) IVPB  50 mg Intravenous Q12H  . vitamin C  500 mg Oral q morning - 10a  . zinc sulfate  220 mg Oral q morning - 10a   Continuous  Infusions:   PRN Meds:.acetaminophen, acetaminophen, alum & mag hydroxide-simeth, HYDROmorphone (DILAUDID) injection, ondansetron (ZOFRAN) IV, ondansetron, oxyCODONE, phenol, sodium chloride    Objective: Weight change:   Intake/Output Summary (Last 24 hours) at 01/03/14 1214 Last data filed at 01/03/14 1007  Gross per 24 hour  Intake 1182.5 ml  Output   6400 ml  Net -5217.5 ml   Blood pressure 140/83, pulse 84, temperature 98.1 F (36.7 C), temperature source Oral, resp. rate 16, height 6' (1.829 m), weight 226 lb (102.513 kg), SpO2 100.00%. Temp:  [98.1 F (36.7 C)-98.5 F (36.9 C)] 98.1 F (36.7 C) (08/21 0610) Pulse Rate:  [84-102] 84 (08/21 0610) Resp:  [16] 16 (08/21 0610) BP: (114-140)/(52-83) 140/83 mmHg (08/21 0610) SpO2:  [98 %-100 %] 100 % (08/21 0610)  Physical Exam: General: Alert and awake, oriented x3, not in any acute distress. HEENT: anicteric sclera, EOMI CVS regular rate, normal  Chest: No wheezing no respiratory distress Abdomen: soft nontender, nondistended, normal bowel sounds, Skin: wound vaccums in place  Neuro: Quadriplegic  CBC:  Recent Labs Lab 12/28/13 0500 12/31/13 0504 01/02/14 0500 01/02/14 0625 01/02/14 1645 01/03/14 0515  HGB 7.2* 7.3* 5.1* 5.4* 8.3* 8.2*  HCT 23.8* 24.3* 17.2* 17.8* 25.8* 25.6*  PLT 459* 472* 409*  --  451* 418*     BMET  Recent Labs  01/02/14 0500 01/03/14 0515  NA 140 141  K 4.3 4.1  CL 106 107  CO2 26 26  GLUCOSE 99 103*  BUN 22 16  CREATININE 0.50 0.43*  CALCIUM 8.4 8.9     Liver Panel  No results found for this basename: PROT, ALBUMIN, AST, ALT, ALKPHOS, BILITOT, BILIDIR, IBILI,  in the last 72 hours     Sedimentation Rate No results found for this basename: ESRSEDRATE,  in the last 72 hours C-Reactive Protein No results found for this basename: CRP,  in the last 72 hours  Micro Results: Recent Results (from the past 720 hour(s))  CULTURE, BLOOD (ROUTINE X 2)     Status: None    Collection Time    12/21/13 11:35 PM      Result Value Ref Range Status   Specimen Description BLOOD LEFT HAND   Final   Special Requests BOTTLES DRAWN AEROBIC ONLY 5CC   Final   Culture  Setup Time     Final   Value: 12/22/2013 13:02  Performed at [facility]     Final   Value: NO GROWTH 5 DAYS     Performed at [facility]   Report Status 12/28/2013 FINAL   Final  CULTURE, BLOOD (ROUTINE X 2)     Status: None   Collection Time    12/21/13 11:40 PM      Result Value Ref Range Status   Specimen Description BLOOD LEFT HAND   Final   Special Requests BOTTLES DRAWN AEROBIC ONLY 5CC   Final   Culture  Setup Time     Final   Value: 12/22/2013 13:02     Performed at [facility]   Culture     Final   Value: NO GROWTH 5 DAYS     Performed at [facility]   Report Status 12/28/2013 FINAL   Final  CULTURE, BLOOD (ROUTINE X 2)     Status: None   Collection Time    12/22/13  6:30 PM      Result Value Ref Range Status   Specimen Description BLOOD RIGHT LINE  5 ML IN [facility] BOTTLE   Final   Special Requests Immunocompromised   Final   Culture  Setup Time     Final   Value: 12/23/2013 00:06     Performed at [facility]   Culture     Final   Value: NO GROWTH 5 DAYS     Performed at [facility]   Report Status 12/29/2013 FINAL   Final  URINE CULTURE     Status: None   Collection Time    12/23/13  6:21 PM      Result Value Ref Range Status   Specimen Description URINE, CATHETERIZED   Final   Special Requests Immunocompromised   Final   Culture  Setup Time     Final   Value: 12/24/2013 00:28     Performed at [facility]     Final   Value: 25,000 COLONIES/ML     Performed at [facility]   Culture     Final   Value: PSEUDOMONAS AERUGINOSA     Performed at [facility]   Report Status 12/26/2013 FINAL   Final   Organism ID, Bacteria PSEUDOMONAS AERUGINOSA   Final  ANAEROBIC CULTURE      Status: None   Collection Time    12/30/13  1:50 PM      Result Value Ref Range Status   Specimen Description SACRAL RIGHT ISCHIUM   Final   Special Requests NONE   Final   Gram Stain     Final   Value: NO WBC SEEN     NO SQUAMOUS EPITHELIAL CELLS SEEN     NO ORGANISMS SEEN     Performed at [facility]   Culture     Final   Value: NO ANAEROBES ISOLATED; CULTURE IN PROGRESS FOR 5 DAYS     Performed at [facility]   Report Status PENDING   Incomplete  CULTURE, ROUTINE-ABSCESS     Status: None   Collection Time    12/30/13  1:50 PM      Result Value Ref Range Status   Specimen Description SACRAL RIGHT ISCHIUM   Final   Special Requests NONE   Final   Gram Stain     Final   Value: NO WBC SEEN     NO SQUAMOUS EPITHELIAL CELLS SEEN  NO ORGANISMS SEEN   Culture     Final   Value: FEW ACINETOBACTER CALCOACETICUS/BAUMANNII COMPLEX     FEW ENTEROCOCCUS SPECIES   Report Status 01/03/2014 FINAL   Final   Organism ID, Bacteria ACINETOBACTER CALCOACETICUS/BAUMANNII COMPLEX   Final   Organism ID, Bacteria ENTEROCOCCUS SPECIES   Final    Studies/Results: No results found.    Assessment/Plan:  Principal Problem:   Infected decubitus ulcer Active Problems:   Quadriplegia   Seizure disorder   HTN (hypertension)   OSA on CPAP   Anemia   UTI (lower urinary tract infection)   Wound infection   Decubitus ulcer of ischium, stage 3   Decubitus ulcer of sacral region, stage 3   Decubitus ulcer of back, stage 3   Decubitus ulcer of lower extremity, stage 2    [name] [name] is a 48 y.o. male with with Quadraplegia, pelvic osteomyelitis, mx decubitus ulcers with recent worsening drainage from ulcers last week and now foul smelling urine. He is sp I and D by Dr. [name] for osteo and abscesses in pelvic region.  #1 Decubitus ulcers imaged with MRI and nowfound to have new abscesses on right near femur and ilium--likely what had previously suddenly started draining.  Chronic osteomyelitis present: He is sp surgery  by Dr. [name] with I and D and deep cultures Greatly appreciate Dr. [name] help with this patient.  CULTURES HAVE GROWN AMP SENSITIVE ENTEROCOCCUS AND MDR ACINETOBACTER   --I WILL CHANGE TO TYGACIL AND GENTAMICIN  --HAVE ASKED LAB TO ADD TYGACIL STRIP, CHECK COLISTIN MIC, CHECK ZERBAXA SENSITIVITY  --I WOULD LIKE PT TO BE OBSERVED WHILE ON AMINIOGLYCOSIDE  DUE TO RISK OF NEPHROTOXICITY. IF WE CAN USE ZERBAXA AND ORGANISM NOT SENSITIVE TO TYGACIL INPARTICULAR, THEN THE ZERBAXA WILL BE PREFERABLE IN THESE DEEP INFECTION--THOUGH NOT CHEAP!  Dr. Megan Salon will be covering this weekend for questions and will followup on the culture Sensis. I will be back on Monday.       LOS: 13 days   Alcide Evener 01/03/2014, 12:14 PM

## 2014-01-04 DIAGNOSIS — B9689 Other specified bacterial agents as the cause of diseases classified elsewhere: Secondary | ICD-10-CM

## 2014-01-04 DIAGNOSIS — M869 Osteomyelitis, unspecified: Secondary | ICD-10-CM

## 2014-01-04 LAB — ANAEROBIC CULTURE: GRAM STAIN: NONE SEEN

## 2014-01-04 LAB — CULTURE, ROUTINE-ABSCESS: Gram Stain: NONE SEEN

## 2014-01-04 LAB — GENTAMICIN LEVEL, RANDOM: GENTAMICIN RM: 2.8 ug/mL

## 2014-01-04 NOTE — Progress Notes (Addendum)
Patient ID: [name] [name], male   DOB: 09-08-1965, 48 y.o.   MRN: [id number]  [facility] PROGRESS NOTE  [name] [name] PIR:[id number] DOB: 1965-10-26 DOA: 12/21/2013 PCP: [name], MD  Brief narrative:  48 year old with C5 quadriplegia with nonhealing sacral wound stage IV, recurrent osteomyelitis, seizure disorder, anemia, OSA on CPAP, history of suprapubic catheter along with colostomy who presented to [facility] on 12/22/13 with sepsis secondary to possible pyelonephritis versus new pelvic abscess/ osteomyelitis. MRI pelvis 12/24/2013 showed new fluid collections in the right buttock highly suspicious for abscesses. Small fluid collection was also seen in the left hip worrisome for abscess. Sacral decubitus ulcer was without underlying abscess. Chronic osteomyelitis deep to the patient's sacral decubitus ulcer was also identified.Pt underwent debridement of sacral and ischial ulcers. ID is assisting with antibiotic management.   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 Small fluid collection was also seen in the left hip ? abscess. Sacral decubitus ulcer was without underlying abscess. Chronic osteomyelitis deep to the sacral decubitus ulcer present.  Patient underwent debridement of sacral and ischial ulcers by Dr. [name] on 12/30/2013 with wound VAC placement. Wound culutre from sacral /right ischium abscess growing acinetobacter and enterococcus.  Continue antibiotic regimen as recommended by Dr. Drucilla Schmidt of ID. Vancomycin, rocephin and flagyl, started on 12/30/2013 and stopped 01/03/2014 as sensitivity report from an abscess culture came with with intermediate sens to Unasyn for Acinetobacter. ID recommended tigecycline and gentamycin. Pt requires 8 weeks of abx therapy per ID recommendations. Active Problems:   Pseudomonas UTI  Pt completed treatment with zosyn.  Given a dose of fosfomycin on 8/12. Now off zosyn .  Obesity hypoventilation syndrome, obstructive sleep apnea  Continue bedtime CPAP  Microcytic anemia  Hemoglobin drop noted 7.3 to 5.1 on 01/02/2014. Status post 3 units PRBC blood transfusion 8/20, with post- transfusion hemoglobin 8.3. Hg and Hct remain stable over the past 24 hours  C5 quadriplegia  Stable. Has deep sacral decub with suprapubic catheter and colostomy. Hypertension  Continue low dose amlodipine.  Moderate protein calorie malnutrition  In the context of chronic illness, decubitus ulcers and osteomyelitis  Continue nutritional supplements  DVT prophylaxis  Resumed Lovenox 8/21  Code Status: Full  Family Communication: family not at the bedside this am  Disposition Plan: Home when stable   IV Access:   Central line triple lumen Left  Procedures and diagnostic studies:    Sacral (4 x 4 x .5 cm), ischial (4 x 3 x 10cm) ulcer- debridement soft tissue, skin, muscle and bone - 12/30/2013.  Medical Consultants:   Plastic sx (Dr. Lyndee Leo Sanger)  ID (Dr. Flossie Dibble)   Other Consultants:   Physical therapy   Anti-Infectives:    IV zosyn (8/8-8/12)   IV vancomycin, Rocephin and Flagyl 8/ 17--> 01/03/2014   Tigecycline 01/03/2014 -->  Gentamycin 8/21 -->   Faye Ramsay, MD  [facility] Pager [phone]  If 7PM-7AM, please contact night-coverage [email or web address] Password [facility] 01/04/2014, 10:44 AM   LOS: 14 days   HPI/Subjective: No events overnight.   Objective: Filed Vitals:   01/03/14 1447 01/03/14 2057 01/03/14 2242 01/04/14 0626  BP: 127/68 141/76  98/51  Pulse: 96 94 89 76  Temp: 98.6 F (37 C) 98.1 F (36.7 C)  98.1 F (36.7 C)  TempSrc: Oral Oral  Oral  Resp: 18  18 18 18   Height:      Weight:      SpO2: 100% 99% 97% 100%    Intake/Output Summary (Last 24 hours) at 01/04/14 1044 Last data filed at 01/04/14 5974  Gross per 24 hour   Intake    675 ml  Output   2650 ml  Net  -1975 ml    Exam:   General:  Pt is alert, follows commands appropriately, not in acute distress  Cardiovascular: Regular rate and rhythm, S1/S2, no murmurs, no rubs, no gallops  Respiratory: Clear to auscultation bilaterally, no wheezing, diminished breath sounds at bases   Abdomen: Soft, non tender, non distended, bowel sounds present, no guarding  Extremities: vac's in place   Data Reviewed: Basic Metabolic Panel:  Recent Labs Lab 12/31/13 0504 01/02/14 0500 01/03/14 0515  NA 141 140 141  K 4.0 4.3 4.1  CL 105 106 107  CO2 27 26 26   GLUCOSE 113* 99 103*  BUN 16 22 16   CREATININE 0.50 0.50 0.43*  CALCIUM 8.7 8.4 8.9   CBC:  Recent Labs Lab 12/31/13 0504 01/02/14 0500 01/02/14 0625 01/02/14 1645 01/03/14 0515  WBC 9.8 11.4*  --  13.5* 12.2*  HGB 7.3* 5.1* 5.4* 8.3* 8.2*  HCT 24.3* 17.2* 17.8* 25.8* 25.6*  MCV 74.8* 74.5*  --  76.6* 77.6*  PLT 472* 409*  --  451* 418*   Recent Results (from the past 240 hour(s))  ANAEROBIC CULTURE     Status: None   Collection Time    12/30/13  1:50 PM      Result Value Ref Range Status   Specimen Description SACRAL RIGHT ISCHIUM   Final   Special Requests NONE   Final   Gram Stain     Final   Value: NO WBC SEEN     NO SQUAMOUS EPITHELIAL CELLS SEEN     NO ORGANISMS SEEN     Performed at [facility]   Culture     Final   Value: NO ANAEROBES ISOLATED; CULTURE IN PROGRESS FOR 5 DAYS     Performed at [facility]   Report Status PENDING   Incomplete  CULTURE, ROUTINE-ABSCESS     Status: None   Collection Time    12/30/13  1:50 PM      Result Value Ref Range Status   Specimen Description SACRAL RIGHT ISCHIUM   Final   Special Requests NONE   Final   Gram Stain     Final   Value: NO WBC SEEN     NO SQUAMOUS EPITHELIAL CELLS SEEN     NO ORGANISMS SEEN     Performed at [facility]   Culture     Final   Value: FEW ACINETOBACTER  CALCOACETICUS/BAUMANNII COMPLEX     1 Note: ZERBAXA 62mcg/mL ETEST results for this drug are "FOR INVESTIGATIONAL USE ONLY" and should NOT be used for clinical purposes. COLISTIN SENSITIVE 0.25ug/mL TIGECYCLINE SENSITIVE     FEW ENTEROCOCCUS SPECIES     Performed at [facility]   Report Status 01/04/2014 FINAL   Final   Organism ID, Bacteria ACINETOBACTER CALCOACETICUS/BAUMANNII COMPLEX   Final   Organism ID, Bacteria ENTEROCOCCUS SPECIES   Final   Organism ID, Bacteria ENTEROCOCCUS SPECIES   Final     Scheduled Meds: . sodium chloride   Intravenous Once  . amLODipine  2.5 mg Oral q morning - 10a  . baclofen  20 mg Oral QID  . collagenase  Topical Daily  . docusate sodium  100 mg Oral BID  . enoxaparin (LOVENOX) injection  50 mg Subcutaneous Daily  . famotidine  20 mg Oral BID  . feeding supplement (ENSURE COMPLETE)  237 mL Oral BID BM  . feeding supplement (PRO-STAT SUGAR FREE 64)  30 mL Oral BID WC  . gentamicin  600 mg Intravenous Q24H  . metoCLOPramide  10 mg Oral TID AC  . multivitamin with minerals  1 tablet Oral q morning - 10a  . nutrition supplement (JUVEN)  1 packet Oral BID BM  . potassium chloride SA  20 mEq Oral BID  . sodium chloride  250 mL Intravenous Once  . tigecycline (TYGACIL) IVPB  50 mg Intravenous Q12H  . vitamin C  500 mg Oral q morning - 10a  . zinc sulfate  220 mg Oral q morning - 10a   Continuous Infusions:

## 2014-01-04 NOTE — Progress Notes (Signed)
Pt placed on CPAP via FFM set at 19 CMH20 per home settings.  Pt tolerating well at this time, RT to monitor and assess as needed.

## 2014-01-04 NOTE — Progress Notes (Signed)
Patient ID: [name] [name], male   DOB: 1966-04-10, 48 y.o.   MRN: [id number]         [facility]    Date of Admission:  12/21/2013    Total days of antibiotics 6         Day 2 tigecycline        Day 2 gentamicin         Principal Problem:   Infected decubitus ulcer Active Problems:   Quadriplegia   Seizure disorder   HTN (hypertension)   OSA on CPAP   Anemia   UTI (lower urinary tract infection)   Wound infection   Decubitus ulcer of ischium, stage 3   Decubitus ulcer of sacral region, stage 3   Decubitus ulcer of back, stage 3   Decubitus ulcer of lower extremity, stage 2   . sodium chloride   Intravenous Once  . amLODipine  2.5 mg Oral q morning - 10a  . baclofen  20 mg Oral QID  . collagenase   Topical Daily  . docusate sodium  100 mg Oral BID  . enoxaparin (LOVENOX) injection  50 mg Subcutaneous Daily  . famotidine  20 mg Oral BID  . feeding supplement (ENSURE COMPLETE)  237 mL Oral BID BM  . feeding supplement (PRO-STAT SUGAR FREE 64)  30 mL Oral BID WC  . gentamicin  600 mg Intravenous Q24H  . metoCLOPramide  10 mg Oral TID AC  . multivitamin with minerals  1 tablet Oral q morning - 10a  . nutrition supplement (JUVEN)  1 packet Oral BID BM  . potassium chloride SA  20 mEq Oral BID  . sodium chloride  250 mL Intravenous Once  . tigecycline (TYGACIL) IVPB  50 mg Intravenous Q12H  . vitamin C  500 mg Oral q morning - 10a  . zinc sulfate  220 mg Oral q morning - 10a   Objective: Temp:  [98.1 F (36.7 C)-98.6 F (37 C)] 98.1 F (36.7 C) (08/22 0626) Pulse Rate:  [76-96] 76 (08/22 0626) Resp:  [18] 18 (08/22 0626) BP: (98-141)/(51-76) 98/51 mmHg (08/22 0626) SpO2:  [97 %-100 %] 100 % (08/22 0626)  Lab Results Lab Results  Component Value Date   WBC 12.2* 01/03/2014   HGB 8.2* 01/03/2014   HCT 25.6* 01/03/2014   MCV 77.6* 01/03/2014   PLT 418* 01/03/2014    Lab Results  Component Value Date   CREATININE 0.43* 01/03/2014   BUN 16  01/03/2014   NA 141 01/03/2014   K 4.1 01/03/2014   CL 107 01/03/2014   CO2 26 01/03/2014     Microbiology: Recent Results (from the past 240 hour(s))  ANAEROBIC CULTURE     Status: None   Collection Time    12/30/13  1:50 PM      Result Value Ref Range Status   Specimen Description SACRAL RIGHT ISCHIUM   Final   Special Requests NONE   Final   Gram Stain     Final   Value: NO WBC SEEN     NO SQUAMOUS EPITHELIAL CELLS SEEN     NO ORGANISMS SEEN     Performed at [facility]   Culture     Final   Value: NO ANAEROBES ISOLATED     Performed at [facility]   Report Status 01/04/2014 FINAL   Final  CULTURE, ROUTINE-ABSCESS     Status: None   Collection Time    12/30/13  1:50 PM  Result Value Ref Range Status   Specimen Description SACRAL RIGHT ISCHIUM   Final   Special Requests NONE   Final   Gram Stain     Final   Value: NO WBC SEEN     NO SQUAMOUS EPITHELIAL CELLS SEEN     NO ORGANISMS SEEN     Performed at [facility]   Culture     Final   Value: FEW ACINETOBACTER CALCOACETICUS/BAUMANNII COMPLEX     1 Note: ZERBAXA 63mcg/mL ETEST results for this drug are "FOR INVESTIGATIONAL USE ONLY" and should NOT be used for clinical purposes. COLISTIN SENSITIVE 0.25ug/mL TIGECYCLINE SENSITIVE     FEW ENTEROCOCCUS SPECIES     Performed at [facility]   Report Status 01/04/2014 FINAL   Final   Organism ID, Bacteria ACINETOBACTER CALCOACETICUS/BAUMANNII COMPLEX   Final   Organism ID, Bacteria ENTEROCOCCUS SPECIES   Final   Organism ID, Bacteria ENTEROCOCCUS SPECIES   Final   Assessment: His multidrug resistant Acinetobacter is sensitive to tigecycline. I will continue it along with gentamicin for his polymicrobial sacral osteomyelitis.  Plan: 1. Continue tigecycline and gentamicin 2. We will followup on Monday, 01/06/2014  Michel Bickers, MD [facility] [phone] pager   [phone] cell 01/04/2014,  12:08 PM

## 2014-01-04 NOTE — Progress Notes (Signed)
ANTIBIOTIC CONSULT NOTE - FOLLOW UP  Pharmacy Consult for Gentamicin Indication: acinetobacter in abscess culture  Allergies  Allergen Reactions  . Ditropan [Oxybutynin] Other (See Comments)    hallucinations    Patient Measurements: Height: 6' (182.9 cm) Weight: 226 lb (102.513 kg) IBW/kg (Calculated) : 77.6 Adjusted Body Weight:   Vital Signs: Temp: 98.1 F (36.7 C) (08/22 0626) Temp src: Oral (08/22 0626) BP: 98/51 mmHg (08/22 0626) Pulse Rate: 76 (08/22 0626) Intake/Output from previous day: 08/21 0701 - 08/22 0700 In: 1015 [P.O.:720; IV Piggyback:215] Out: 4081 [Urine:3850] Intake/Output from this shift: Total I/O In: -  Out: 1600 [Urine:1600]  Labs:  Recent Labs  01/02/14 0500 01/02/14 0625 01/02/14 1645 01/03/14 0515  WBC 11.4*  --  13.5* 12.2*  HGB 5.1* 5.4* 8.3* 8.2*  PLT 409*  --  451* 418*  CREATININE 0.50  --   --  0.43*   Estimated Creatinine Clearance: 141.4 ml/min (by C-G formula based on Cr of 0.43).  Recent Labs  01/02/14 0500 01/03/14 2330  VANCOTROUGH 13.5  --   GENTRANDOM  --  2.8     Microbiology: Recent Results (from the past 720 hour(s))  CULTURE, BLOOD (ROUTINE X 2)     Status: None   Collection Time    12/21/13 11:35 PM      Result Value Ref Range Status   Specimen Description BLOOD LEFT HAND   Final   Special Requests BOTTLES DRAWN AEROBIC ONLY 5CC   Final   Culture  Setup Time     Final   Value: 12/22/2013 13:02     Performed at [facility]   Culture     Final   Value: NO GROWTH 5 DAYS     Performed at [facility]   Report Status 12/28/2013 FINAL   Final  CULTURE, BLOOD (ROUTINE X 2)     Status: None   Collection Time    12/21/13 11:40 PM      Result Value Ref Range Status   Specimen Description BLOOD LEFT HAND   Final   Special Requests BOTTLES DRAWN AEROBIC ONLY 5CC   Final   Culture  Setup Time     Final   Value: 12/22/2013 13:02     Performed at [facility]   Culture     Final   Value: NO GROWTH 5 DAYS     Performed at [facility]   Report Status 12/28/2013 FINAL   Final  CULTURE, BLOOD (ROUTINE X 2)     Status: None   Collection Time    12/22/13  6:30 PM      Result Value Ref Range Status   Specimen Description BLOOD RIGHT LINE  5 ML IN [facility] BOTTLE   Final   Special Requests Immunocompromised   Final   Culture  Setup Time     Final   Value: 12/23/2013 00:06     Performed at [facility]   Culture     Final   Value: NO GROWTH 5 DAYS     Performed at [facility]   Report Status 12/29/2013 FINAL   Final  URINE CULTURE     Status: None   Collection Time    12/23/13  6:21 PM      Result Value Ref Range Status   Specimen Description URINE, CATHETERIZED   Final   Special Requests Immunocompromised   Final   Culture  Setup Time     Final  Value: 12/24/2013 00:28     Performed at [place]     Final   Value: 25,000 COLONIES/ML     Performed at [facility]   Culture     Final   Value: PSEUDOMONAS AERUGINOSA     Performed at [facility]   Report Status 12/26/2013 FINAL   Final   Organism ID, Bacteria PSEUDOMONAS AERUGINOSA   Final  ANAEROBIC CULTURE     Status: None   Collection Time    12/30/13  1:50 PM      Result Value Ref Range Status   Specimen Description SACRAL RIGHT ISCHIUM   Final   Special Requests NONE   Final   Gram Stain     Final   Value: NO WBC SEEN     NO SQUAMOUS EPITHELIAL CELLS SEEN     NO ORGANISMS SEEN     Performed at [facility]   Culture     Final   Value: NO ANAEROBES ISOLATED; CULTURE IN PROGRESS FOR 5 DAYS     Performed at [facility]   Report Status PENDING   Incomplete  CULTURE, ROUTINE-ABSCESS     Status: None   Collection Time    12/30/13  1:50 PM      Result Value Ref Range Status   Specimen Description SACRAL RIGHT ISCHIUM   Final   Special Requests NONE   Final   Gram Stain     Final   Value: NO WBC SEEN     NO SQUAMOUS  EPITHELIAL CELLS SEEN     NO ORGANISMS SEEN   Culture     Final   Value: FEW ACINETOBACTER CALCOACETICUS/BAUMANNII COMPLEX     FEW ENTEROCOCCUS SPECIES   Report Status 01/03/2014 FINAL   Final   Organism ID, Bacteria ACINETOBACTER CALCOACETICUS/BAUMANNII COMPLEX   Final   Organism ID, Bacteria ENTEROCOCCUS SPECIES   Final    Anti-infectives   Start     Dose/Rate Route Frequency Ordered Stop   01/03/14 2156  tigecycline (TYGACIL) 50 mg in sodium chloride 0.9 % 100 mL IVPB     50 mg 200 mL/hr over 30 Minutes Intravenous Every 12 hours 01/03/14 0956     01/03/14 1200  gentamicin (GARAMYCIN) 600 mg in dextrose 5 % 100 mL IVPB     600 mg 115 mL/hr over 60 Minutes Intravenous Every 24 hours 01/03/14 1046     01/03/14 1000  tigecycline (TYGACIL) 100 mg in sodium chloride 0.9 % 100 mL IVPB     100 mg 200 mL/hr over 30 Minutes Intravenous  Once 01/03/14 0956 01/03/14 1133   01/02/14 0600  vancomycin (VANCOCIN) 1,250 mg in sodium chloride 0.9 % 250 mL IVPB  Status:  Discontinued     1,250 mg 166.7 mL/hr over 90 Minutes Intravenous Every 12 hours 01/02/14 0559 01/03/14 0956   12/31/13 0600  vancomycin (VANCOCIN) IVPB 1000 mg/200 mL premix  Status:  Discontinued     1,000 mg 200 mL/hr over 60 Minutes Intravenous Every 12 hours 12/30/13 1612 01/02/14 0559   12/30/13 1700  vancomycin (VANCOCIN) 2,500 mg in sodium chloride 0.9 % 500 mL IVPB     2,500 mg 250 mL/hr over 120 Minutes Intravenous  Once 12/30/13 1612 12/30/13 1932   12/30/13 1600  cefTRIAXone (ROCEPHIN) 2 g in dextrose 5 % 50 mL IVPB  Status:  Discontinued     2 g 100 mL/hr over 30 Minutes Intravenous Every 24 hours  12/30/13 1538 01/03/14 0956   12/30/13 1545  metroNIDAZOLE (FLAGYL) tablet 500 mg  Status:  Discontinued     500 mg Oral 3 times per day 12/30/13 1538 01/03/14 0956   12/30/13 1325  polymyxin B 500,000 Units, bacitracin 50,000 Units in sodium chloride irrigation 0.9 % 500 mL irrigation  Status:  Discontinued       As  needed 12/30/13 1325 12/30/13 1417   12/28/13 0600  ceFAZolin (ANCEF) IVPB 2 g/50 mL premix     2 g 100 mL/hr over 30 Minutes Intravenous On call to O.R. 12/27/13 1621 12/29/13 0559   12/22/13 1400  vancomycin (VANCOCIN) 1,250 mg in sodium chloride 0.9 % 250 mL IVPB  Status:  Discontinued     1,250 mg 166.7 mL/hr over 90 Minutes Intravenous Every 12 hours 12/22/13 0201 12/22/13 1132   12/22/13 0800  piperacillin-tazobactam (ZOSYN) IVPB 3.375 g  Status:  Discontinued     3.375 g 12.5 mL/hr over 240 Minutes Intravenous Every 8 hours 12/22/13 0146 12/25/13 1628   12/22/13 0600  piperacillin-tazobactam (ZOSYN) IVPB 3.375 g  Status:  Discontinued     3.375 g 100 mL/hr over 30 Minutes Intravenous 3 times per day 12/22/13 0129 12/22/13 0146   12/22/13 0145  vancomycin (VANCOCIN) 2,500 mg in sodium chloride 0.9 % 500 mL IVPB     2,500 mg 250 mL/hr over 120 Minutes Intravenous  Once 12/22/13 0135 12/22/13 0428   12/21/13 2230  piperacillin-tazobactam (ZOSYN) IVPB 3.375 g     3.375 g 100 mL/hr over 30 Minutes Intravenous  Once 12/21/13 2216 12/22/13 1216   12/21/13 2215  vancomycin (VANCOCIN) IVPB 1000 mg/200 mL premix  Status:  Discontinued     1,000 mg 200 mL/hr over 60 Minutes Intravenous  Once 12/21/13 2211 12/22/13 0133      Assessment: Patient with gentamicin level at goal for q24hr dosing.  Goal of Therapy:  Dosing per [place] [facility]  Plan:  Follow up culture results Continue with current dosing  Nani Skillern [place] 01/04/2014,6:33 AM

## 2014-01-05 LAB — CBC
HCT: 26.2 % — ABNORMAL LOW (ref 39.0–52.0)
Hemoglobin: 8.2 g/dL — ABNORMAL LOW (ref 13.0–17.0)
MCH: 24.7 pg — AB (ref 26.0–34.0)
MCHC: 31.3 g/dL (ref 30.0–36.0)
MCV: 78.9 fL (ref 78.0–100.0)
PLATELETS: 452 10*3/uL — AB (ref 150–400)
RBC: 3.32 MIL/uL — AB (ref 4.22–5.81)
RDW: 21.4 % — ABNORMAL HIGH (ref 11.5–15.5)
WBC: 10 10*3/uL (ref 4.0–10.5)

## 2014-01-05 LAB — BASIC METABOLIC PANEL
ANION GAP: 10 (ref 5–15)
BUN: 25 mg/dL — ABNORMAL HIGH (ref 6–23)
CALCIUM: 8.8 mg/dL (ref 8.4–10.5)
CO2: 25 meq/L (ref 19–32)
Chloride: 106 mEq/L (ref 96–112)
Creatinine, Ser: 0.52 mg/dL (ref 0.50–1.35)
GFR calc Af Amer: >90 mL/min (ref >90)
GFR calc non Af Amer: >90 mL/min (ref >90)
Glucose, Bld: 94 mg/dL (ref 70–99)
POTASSIUM: 4.6 meq/L (ref 3.7–5.3)
SODIUM: 141 meq/L (ref 137–147)

## 2014-01-05 NOTE — Progress Notes (Signed)
Patient ID: [name] [name], male   DOB: 1965-11-05, 48 y.o.   MRN: [id number]  [facility] PROGRESS NOTE  [name] [name] AOZ:[id number] DOB: 1965/07/25 DOA: 12/21/2013 PCP: [name] Greenland, MD  Brief narrative:  48 year old with C5 quadriplegia with nonhealing sacral wound stage IV, recurrent osteomyelitis, seizure disorder, anemia, OSA on CPAP, history of suprapubic catheter along with colostomy who presented to [facility] on 12/22/13 with sepsis secondary to possible pyelonephritis versus new pelvic abscess/ osteomyelitis. MRI pelvis 12/24/2013 showed new fluid collections in the right buttock highly suspicious for abscesses. Small fluid collection was also seen in the left hip worrisome for abscess. Sacral decubitus ulcer was without underlying abscess. Chronic osteomyelitis deep to the patient's sacral decubitus ulcer was also identified.Pt underwent debridement of sacral and ischial ulcers. ID is assisting with antibiotic management.   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 Small fluid collection was also seen in the left hip ? abscess. Sacral decubitus ulcer was without underlying abscess. Chronic osteomyelitis deep to the sacral decubitus ulcer present.  Patient underwent debridement of sacral and ischial ulcers by Dr. [name] on 12/30/2013 with wound VAC placement. Wound culutre from sacral /right ischium abscess growing acinetobacter and enterococcus.  Continue antibiotic regimen as recommended by Dr. Drucilla Schmidt of ID. Vancomycin, rocephin and flagyl, started on 12/30/2013 and stopped 01/03/2014 as sensitivity report from an abscess culture came with with intermediate sens to Unasyn for Acinetobacter. ID recommended tigecycline and gentamycin. Pt requires 8 weeks of abx therapy per ID recommendations. PT clinically  improving and WBC now stable and WNL  Active Problems:  Pseudomonas UTI  Pt completed treatment with zosyn.  Given a dose of fosfomycin on 8/12. Now off zosyn .  Obesity hypoventilation syndrome, obstructive sleep apnea  Continue bedtime CPAP  Microcytic anemia  Hemoglobin drop noted 7.3 to 5.1 on 01/02/2014. Status post 3 units PRBC blood transfusion 8/20, with post- transfusion hemoglobin 8.3.  Hg and Hct remain stable over the past 24 hours  C5 quadriplegia  Stable. Has deep sacral decub with suprapubic catheter and colostomy. Hypertension  Continue low dose amlodipine.  Suprapubic cath   Came out, urology consulted  Moderate protein calorie malnutrition  In the context of chronic illness, decubitus ulcers and osteomyelitis  Continue nutritional supplements  DVT prophylaxis  Resumed Lovenox 8/21  Code Status: Full  Family Communication: family not at the bedside this am  Disposition Plan: Home when stable   IV Access:   Central line triple lumen Left  Procedures and diagnostic studies:   Sacral (4 x 4 x .5 cm), ischial (4 x 3 x 10cm) ulcer- debridement soft tissue, skin, muscle and bone - 12/30/2013.  Medical Consultants:   Plastic sx (Dr. Lyndee Leo Sanger)  ID (Dr. Flossie Dibble)   Other Consultants:   Physical therapy   Anti-Infectives:   IV zosyn (8/8-8/12)  IV vancomycin, Rocephin and Flagyl 8/ 17--> 01/03/2014  Tigecycline 01/03/2014 -->  Gentamycin 8/21 -->    Faye Ramsay, MD  [facility] Pager [phone]  If 7PM-7AM, please contact night-coverage [email or web address] Password [email or web address] 01/05/2014, 8:04 AM   LOS: 15 days   HPI/Subjective: No events overnight.   Objective: Filed Vitals:   01/04/14 0626 01/04/14 1400 01/04/14 2112 01/05/14 0535  BP: 98/51 94/62 96/46  127/86  Pulse: 76 94 90 67  Temp: 98.1 F (36.7 C) 98.5  F (36.9 C) 98.4 F (36.9 C) 98.2 F (36.8 C)  TempSrc: Oral Oral Oral Axillary  Resp: 18 18 20 20   Height:      Weight:      SpO2: 100% 99%  99% 100%    Intake/Output Summary (Last 24 hours) at 01/05/14 0804 Last data filed at 01/05/14 6295  Gross per 24 hour  Intake   1920 ml  Output   4726 ml  Net  -2806 ml    Exam:   General:  Pt is alert, follows commands appropriately, not in acute distress  Cardiovascular: Regular rate and rhythm, S1/S2, no murmurs, no rubs, no gallops  Respiratory: Clear to auscultation bilaterally, no wheezing, no crackles, no rhonchi  Abdomen: Soft, non tender, non distended, bowel sounds present, no guarding   Data Reviewed: Basic Metabolic Panel:  Recent Labs Lab 12/31/13 0504 01/02/14 0500 01/03/14 0515 01/05/14 0515  NA 141 140 141 141  K 4.0 4.3 4.1 4.6  CL 105 106 107 106  CO2 27 26 26 25   GLUCOSE 113* 99 103* 94  BUN 16 22 16  25*  CREATININE 0.50 0.50 0.43* 0.52  CALCIUM 8.7 8.4 8.9 8.8   CBC:  Recent Labs Lab 12/31/13 0504 01/02/14 0500 01/02/14 0625 01/02/14 1645 01/03/14 0515 01/05/14 0515  WBC 9.8 11.4*  --  13.5* 12.2* 10.0  HGB 7.3* 5.1* 5.4* 8.3* 8.2* 8.2*  HCT 24.3* 17.2* 17.8* 25.8* 25.6* 26.2*  MCV 74.8* 74.5*  --  76.6* 77.6* 78.9  PLT 472* 409*  --  451* 418* 452*     Recent Results (from the past 240 hour(s))  ANAEROBIC CULTURE     Status: None   Collection Time    12/30/13  1:50 PM      Result Value Ref Range Status   Specimen Description SACRAL RIGHT ISCHIUM   Final   Special Requests NONE   Final   Gram Stain     Final   Value: NO WBC SEEN     NO SQUAMOUS EPITHELIAL CELLS SEEN     NO ORGANISMS SEEN     Performed at [facility]   Culture     Final   Value: NO ANAEROBES ISOLATED     Performed at [facility]   Report Status 01/04/2014 FINAL   Final  CULTURE, ROUTINE-ABSCESS     Status: None   Collection Time    12/30/13  1:50 PM      Result Value Ref Range Status   Specimen Description SACRAL RIGHT ISCHIUM   Final   Special Requests NONE   Final   Gram Stain     Final   Value: NO WBC SEEN     NO SQUAMOUS  EPITHELIAL CELLS SEEN     NO ORGANISMS SEEN     Performed at [facility]   Culture     Final   Value: FEW ACINETOBACTER CALCOACETICUS/BAUMANNII COMPLEX     1 Note: ZERBAXA 83mcg/mL ETEST results for this drug are "FOR INVESTIGATIONAL USE ONLY" and should NOT be used for clinical purposes. COLISTIN SENSITIVE 0.25ug/mL TIGECYCLINE SENSITIVE     FEW ENTEROCOCCUS SPECIES     Performed at [facility]   Report Status 01/04/2014 FINAL   Final   Organism ID, Bacteria ACINETOBACTER CALCOACETICUS/BAUMANNII COMPLEX   Final   Organism ID, Bacteria ENTEROCOCCUS SPECIES   Final   Organism ID, Bacteria ENTEROCOCCUS SPECIES   Final     Scheduled Meds: . sodium chloride  Intravenous Once  . amLODipine  2.5 mg Oral q morning - 10a  . baclofen  20 mg Oral QID  . collagenase   Topical Daily  . docusate sodium  100 mg Oral BID  . enoxaparin (LOVENOX) injection  50 mg Subcutaneous Daily  . famotidine  20 mg Oral BID  . feeding supplement (ENSURE COMPLETE)  237 mL Oral BID BM  . feeding supplement (PRO-STAT SUGAR FREE 64)  30 mL Oral BID WC  . gentamicin  600 mg Intravenous Q24H  . metoCLOPramide  10 mg Oral TID AC  . multivitamin with minerals  1 tablet Oral q morning - 10a  . nutrition supplement (JUVEN)  1 packet Oral BID BM  . potassium chloride SA  20 mEq Oral BID  . sodium chloride  250 mL Intravenous Once  . tigecycline (TYGACIL) IVPB  50 mg Intravenous Q12H  . vitamin C  500 mg Oral q morning - 10a  . zinc sulfate  220 mg Oral q morning - 10a   Continuous Infusions:
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
## 2014-01-06 ENCOUNTER — Encounter (HOSPITAL_BASED_OUTPATIENT_CLINIC_OR_DEPARTMENT_OTHER): Payer: Medicare Other | Attending: Plastic Surgery

## 2014-01-06 DIAGNOSIS — L8994 Pressure ulcer of unspecified site, stage 4: Secondary | ICD-10-CM | POA: Insufficient documentation

## 2014-01-06 DIAGNOSIS — L89309 Pressure ulcer of unspecified buttock, unspecified stage: Secondary | ICD-10-CM | POA: Insufficient documentation

## 2014-01-06 DIAGNOSIS — L89109 Pressure ulcer of unspecified part of back, unspecified stage: Secondary | ICD-10-CM | POA: Insufficient documentation

## 2014-01-06 DIAGNOSIS — L89899 Pressure ulcer of other site, unspecified stage: Secondary | ICD-10-CM | POA: Insufficient documentation

## 2014-01-06 DIAGNOSIS — L8993 Pressure ulcer of unspecified site, stage 3: Secondary | ICD-10-CM | POA: Insufficient documentation

## 2014-01-06 LAB — BASIC METABOLIC PANEL
ANION GAP: 11 (ref 5–15)
BUN: 21 mg/dL (ref 6–23)
CHLORIDE: 104 meq/L (ref 96–112)
CO2: 25 mEq/L (ref 19–32)
Calcium: 8.8 mg/dL (ref 8.4–10.5)
Creatinine, Ser: 0.43 mg/dL — ABNORMAL LOW (ref 0.50–1.35)
GFR calc non Af Amer: >90 mL/min (ref >90)
Glucose, Bld: 104 mg/dL — ABNORMAL HIGH (ref 70–99)
Potassium: 4.5 mEq/L (ref 3.7–5.3)
Sodium: 140 mEq/L (ref 137–147)

## 2014-01-06 LAB — CBC
HEMATOCRIT: 27.8 % — AB (ref 39.0–52.0)
Hemoglobin: 8.5 g/dL — ABNORMAL LOW (ref 13.0–17.0)
MCH: 24.5 pg — ABNORMAL LOW (ref 26.0–34.0)
MCHC: 30.6 g/dL (ref 30.0–36.0)
MCV: 80.1 fL (ref 78.0–100.0)
PLATELETS: 422 10*3/uL — AB (ref 150–400)
RBC: 3.47 MIL/uL — ABNORMAL LOW (ref 4.22–5.81)
RDW: 21.6 % — AB (ref 11.5–15.5)
WBC: 10.4 10*3/uL (ref 4.0–10.5)

## 2014-01-06 MED ORDER — GENTAMICIN SULFATE 40 MG/ML IJ SOLN: 600.0000 mg | INTRAVENOUS | Status: DC

## 2014-01-06 MED ORDER — TIGECYCLINE 50 MG IV SOLR: 50.0000 mg | Freq: Two times a day (BID) | INTRAVENOUS | Status: DC

## 2014-01-06 MED ORDER — OXYCODONE HCL 5 MG PO TABS: 5.0000 mg | ORAL_TABLET | ORAL | Status: DC | PRN

## 2014-01-06 MED ORDER — DEXTROSE 5 % IV SOLN: 600.0000 mg | INTRAVENOUS | Status: DC

## 2014-01-06 NOTE — Progress Notes (Signed)
Pt discharged home via [facility]; pt is alert and oriented times 4; aware of discharge and understands all discharge instructions; unable to sign to quadraplegia; wound vac supplies sent home with family friend; home health set up and arranged; sacral dressing changed prior to discharge; IV flushed with heparin per protocal; awaiting transport at this time

## 2014-01-06 NOTE — Discharge Summary (Addendum)
Physician Discharge Summary  [name] [name] OEU:[id number] DOB: September 07, 1965 DOA: 12/21/2013  PCP: [name], MD  Admit date: 12/21/2013 Discharge date: 01/06/2014  Recommendations for Outpatient Follow-up:  1. Pt will need to follow up with PCP in 2-3 weeks post discharge 2. HHRN for IV ABX tigecycline to continue until October 17th. 2015 3. Wound vac dressing changes 4. Pt needs BMP checked weekly starting tomorrow 01/07/2014 and faxed to Dr. Tommy Medal at [phone] 5. Pt unsafe to travel by any other means other than ambulance   Discharge Diagnoses: Infected decubitus ulcer  Principal Problem:   Infected decubitus ulcer Active Problems:   Quadriplegia   Seizure disorder   HTN (hypertension)   OSA on CPAP   Anemia   UTI (lower urinary tract infection)   Wound infection   Decubitus ulcer of ischium, stage 3   Decubitus ulcer of sacral region, stage 3   Decubitus ulcer of back, stage 3   Decubitus ulcer of lower extremity, stage 2  Discharge Condition: Stable  Diet recommendation: Heart healthy diet discussed in details   Brief narrative:  48 year old with C5 quadriplegia with nonhealing sacral wound stage IV, recurrent osteomyelitis, seizure disorder, anemia, OSA on CPAP, history of suprapubic catheter along with colostomy who presented to [facility] on 12/22/13 with sepsis secondary to possible pyelonephritis versus new pelvic abscess/ osteomyelitis. MRI pelvis 12/24/2013 showed new fluid collections in the right buttock highly suspicious for abscesses. Small fluid collection was also seen in the left hip worrisome for abscess. Sacral decubitus ulcer was without underlying abscess. Chronic osteomyelitis deep to the patient's sacral decubitus ulcer was also identified.Pt underwent debridement of sacral and ischial ulcers. ID is assisting with antibiotic management.   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 Small fluid collection was also seen in the left hip ? abscess. Sacral decubitus ulcer was without underlying abscess. Chronic osteomyelitis deep to the sacral decubitus ulcer present.  Patient underwent debridement of sacral and ischial ulcers by Dr. [name] on 12/30/2013 with wound VAC placement. Wound culutre from sacral /right ischium abscess growing acinetobacter and enterococcus.  Continue antibiotic regimen as recommended by Dr. Drucilla Schmidt of ID. Vancomycin, rocephin and flagyl, started on 12/30/2013 and stopped 01/03/2014 as sensitivity report from an abscess culture came with with intermediate sens to Unasyn for Acinetobacter. ID recommended tigecycline Continue Until October 17th, 2015 Pt needs weekly BMP to be faxed to Dr. Tommy Medal as noted above PT clinically improving and WBC now stable and WNL  Active Problems:  Pseudomonas UTI  Pt completed treatment with zosyn.  Given a dose of fosfomycin on 8/12. Now off zosyn .  Obesity hypoventilation syndrome, obstructive sleep apnea  Continue bedtime CPAP  Microcytic anemia  Hemoglobin drop noted 7.3 to 5.1 on 01/02/2014. Status post 3 units PRBC blood transfusion 8/20, with post- transfusion hemoglobin 8.3.  Hg and Hct remain stable over the past 24 hours  C5 quadriplegia  Stable. Has deep sacral decub with suprapubic catheter and colostomy. Hypertension  Continue low dose amlodipine.  Suprapubic cath  Came out, urology replaced, appreciate assistance  Moderate protein calorie malnutrition  In the context of chronic illness, decubitus ulcers and osteomyelitis  Continue nutritional supplements   Code Status: Full  Family Communication: family not at the bedside this am  Disposition Plan: Home   IV Access:  Central line triple lumen Left  Procedures and diagnostic studies:   Sacral (4 x 4 x .5 cm), ischial (4 x 3 x 10cm) ulcer-  debridement soft tissue, skin, muscle and bone - 12/30/2013.   Medical Consultants:   Plastic sx (Dr. Lyndee Leo Sanger)  ID (Dr. Flossie Dibble)   Other Consultants:   Physical therapy   Anti-Infectives:   IV zosyn (8/8-8/12)  IV vancomycin, Rocephin and Flagyl 8/ 17--> 01/03/2014  Tigecycline 01/03/2014 --> 02/15/2014 Gentamycin 8/21 --> 8/24  Discharge Exam: Filed Vitals:   01/06/14 0524  BP: 123/77  Pulse: 88  Temp: 99.6 F (37.6 C)  Resp: 20   Filed Vitals:   01/05/14 0535 01/05/14 1610 01/05/14 2029 01/06/14 0524  BP: 127/86 104/60 101/58 123/77  Pulse: 67 97 101 88  Temp: 98.2 F (36.8 C) 98.3 F (36.8 C) 98.7 F (37.1 C) 99.6 F (37.6 C)  TempSrc: Axillary Oral Oral Axillary  Resp: 20 18 20 20   Height:      Weight:      SpO2: 100% 98% 99% 99%    General: Pt is alert, follows commands appropriately, not in acute distress Cardiovascular: Regular rate and rhythm, S1/S2 +, no murmurs, no rubs, no gallops Respiratory: Clear to auscultation bilaterally, no wheezing, no crackles, no rhonchi Abdominal: Soft, non tender, non distended, bowel sounds +, no guarding  Discharge Instructions  Discharge Instructions   Diet - low sodium heart healthy    Complete by:  As directed      Increase activity slowly    Complete by:  As directed             Medication List    STOP taking these medications       sulfamethoxazole-trimethoprim 400-80 MG per tablet  Commonly known as:  BACTRIM,SEPTRA      TAKE these medications       amLODipine 2.5 MG tablet  Commonly known as:  NORVASC  Take 1 tablet (2.5 mg total) by mouth every morning.     baclofen 20 MG tablet  Commonly known as:  LIORESAL  Take 20 mg by mouth 4 (four) times daily.     collagenase ointment  Commonly known as:  SANTYL  Apply 1 application topically daily.     docusate sodium 100 MG capsule  Commonly known as:  COLACE  Take 100 mg by mouth 2 (two) times daily.     famotidine 20 MG tablet   Commonly known as:  PEPCID  Take 20 mg by mouth 2 (two) times daily.     furosemide 20 MG tablet  Commonly known as:  LASIX  Take 1 tablet (20 mg total) by mouth daily.     meloxicam 7.5 MG tablet  Commonly known as:  MOBIC  Take 7.5 mg by mouth 2 (two) times daily as needed for pain.     metoCLOPramide 10 MG tablet  Commonly known as:  REGLAN  Take 1 tablet (10 mg total) by mouth 3 (three) times daily before meals.     multivitamin with minerals Tabs tablet  Take 1 tablet by mouth every morning.     feeding supplement Liqd  Take 1 Container by mouth 3 (three) times daily between meals.     nutrition supplement (JUVEN) Pack  Take 1 packet by mouth 2 (two) times daily between meals.     oxyCODONE 5 MG immediate release tablet  Commonly known as:  Oxy IR/ROXICODONE  Take 1 tablet (5 mg total) by mouth every  4 (four) hours as needed for moderate pain.     potassium chloride SA 20 MEQ tablet  Commonly known as:  K-DUR,KLOR-CON  Take 20 mEq by mouth 2 (two) times daily.     tigecycline 50 mg in sodium chloride 0.9 % 100 mL  Inject 50 mg into the vein every 12 (twelve) hours. Continue until October 17th, 2015     vitamin C 500 MG tablet  Commonly known as:  ASCORBIC ACID  Take 500 mg by mouth every morning.     zinc sulfate 220 MG capsule  Take 220 mg by mouth every morning.           Follow-up Information   Follow up with [facility]              In 1 week.   Contact information:   [address] [place] [address] [phone]      Follow up with [facility] (home health nurse)    Contact information:   [address] [place] [address] [phone]       Schedule an appointment as soon as possible for a visit with [name], MD.   Specialty:  Internal Medicine   Contact information:   [address] [place] [address] [phone]       Follow up with  Faye Ramsay, MD. (As needed, call my cell phone [phone])    Specialty:  Internal Medicine   Contact information:   [address] [place] [address] [phone]        The results of significant diagnostics from this hospitalization (including imaging, microbiology, ancillary and laboratory) are listed below for reference.     Microbiology: Recent Results (from the past 240 hour(s))  ANAEROBIC CULTURE     Status: None   Collection Time    12/30/13  1:50 PM      Result Value Ref Range Status   Specimen Description SACRAL RIGHT ISCHIUM   Final   Special Requests NONE   Final   Gram Stain     Final   Value: NO WBC SEEN     NO SQUAMOUS EPITHELIAL CELLS SEEN     NO ORGANISMS SEEN     Performed at [facility]   Culture     Final   Value: NO ANAEROBES ISOLATED     Performed at [facility]   Report Status 01/04/2014 FINAL   Final  CULTURE, ROUTINE-ABSCESS     Status: None   Collection Time    12/30/13  1:50 PM      Result Value Ref Range Status   Specimen Description SACRAL RIGHT ISCHIUM   Final   Special Requests NONE   Final   Gram Stain     Final   Value: NO WBC SEEN     NO SQUAMOUS EPITHELIAL CELLS SEEN     NO ORGANISMS SEEN     Performed at [facility]   Culture     Final   Value: FEW ACINETOBACTER CALCOACETICUS/BAUMANNII COMPLEX     1 Note: ZERBAXA 24mcg/mL ETEST results for this drug are "FOR INVESTIGATIONAL USE ONLY" and should NOT be used for clinical purposes. COLISTIN SENSITIVE 0.25ug/mL TIGECYCLINE SENSITIVE     FEW ENTEROCOCCUS SPECIES     Performed at [facility]   Report Status 01/04/2014 FINAL   Final   Organism ID, Bacteria ACINETOBACTER CALCOACETICUS/BAUMANNII COMPLEX  Final   Organism ID, Bacteria ENTEROCOCCUS SPECIES   Final   Organism ID, Bacteria ENTEROCOCCUS SPECIES   Final     Labs: Basic Metabolic Panel:  Recent Labs Lab 12/31/13 0504 01/02/14 0500 01/03/14 0515  01/05/14 0515 01/06/14 0515  NA 141 140 141 141 140  K 4.0 4.3 4.1 4.6 4.5  CL 105 106 107 106 104  CO2 27 26 26 25 25   GLUCOSE 113* 99 103* 94 104*  BUN 16 22 16  25* 21  CREATININE 0.50 0.50 0.43* 0.52 0.43*  CALCIUM 8.7 8.4 8.9 8.8 8.8   Liver Function Tests: No results found for this basename: AST, ALT, ALKPHOS, BILITOT, PROT, ALBUMIN,  in the last 168 hours No results found for this basename: LIPASE, AMYLASE,  in the last 168 hours No results found for this basename: AMMONIA,  in the last 168 hours CBC:  Recent Labs Lab 01/02/14 0500 01/02/14 0625 01/02/14 1645 01/03/14 0515 01/05/14 0515 01/06/14 0515  WBC 11.4*  --  13.5* 12.2* 10.0 10.4  HGB 5.1* 5.4* 8.3* 8.2* 8.2* 8.5*  HCT 17.2* 17.8* 25.8* 25.6* 26.2* 27.8*  MCV 74.5*  --  76.6* 77.6* 78.9 80.1  PLT 409*  --  451* 418* 452* 422*   Cardiac Enzymes: No results found for this basename: CKTOTAL, CKMB, CKMBINDEX, TROPONINI,  in the last 168 hours BNP: BNP (last 3 results)  Recent Labs  07/19/13 1938  PROBNP 79.5   CBG: No results found for this basename: GLUCAP,  in the last 168 hours   SIGNED: Time coordinating discharge: Over 30 minutes  Faye Ramsay, MD  [facility] 01/06/2014, 10:40 AM Pager [phone]  If 7PM-7AM, please contact night-coverage [email or web address] Password [email or web address]

## 2014-01-06 NOTE — Discharge Instructions (Signed)
Vacuum-Assisted Closure Therapy Vacuum-assisted closure (VAC) therapy uses a device that removes fluid and germs from wounds to help them heal. It is used on wounds that cannot be closed with stitches. They often heal slowly. Vacuum-assisted therapy helps the wound stay clean and healthy while the open wound slowly grows back together. Vacuum-assisted closure therapy uses a bandage (dressing) that is made of foam. It is put inside the wound. Then, a drape is placed over the wound. This drape sticks to your skin to keep air out, and to protect the wound. A tube is hooked up to a small pump and is attached to the drape. The pump sucks out the fluid and germs. Vacuum-assisted closure therapy can also help reduce the bad smell that comes from the wound. HOW DOES IT WORK?  The vacuum pump pulls fluid through the foam dressing. The dressing may wrinkle during this process. The fluid goes into the tube and away from the wound. The fluid then goes into a container. The fluid in the container must be replaced if it is full or at least once a week, even if the container is not full. The pulling from the pump helps to close the wound and bring better circulation to the wound area. The foam dressing covers and protects the wound. It helps your wound heal faster.  HOW DOES IT FEEL?   You might feel a little pulling when the pump is on.  You might also feel a mild vibrating sensation.  You might feel some discomfort when the dressing is taken off. CAN I MOVE AROUND WITH VACUUM-ASSISTED CLOSURE THERAPY? Yes, it has a backup battery which is used when the machine is not plugged in, as long as the battery is working, you can move freely. WHAT ARE SOME THINGS I MUST KNOW?  Do not turn off the pump yourself, unless instructed to do so by your healthcare provider, such as for bathing.  Do not take off the dressing yourself, unless instructed to do so by your caregiver.  You can wash or shower with the dressing.  However, do not take the pump into the shower. Make sure the wound dressing is protected and covered with plastic. The wound area must stay dry.  Do not turn off the pump for more than 2 hours. If the pump is off for more than 2 hours, your nurse must change your dressing.  Check frequently that the machine is on, that the machine indicates the therapy is on, and that all clamps are open. THE ALARM IS SOUNDING! WHAT SHOULD I DO?   Stay calm.  Do not turn off the pump or do anything with the dressing.  Call your clinic or caregiver right away if the alarm goes off and you cannot fix the problem. Some reasons the alarm might go off include:  The fluid collection container is full.  The battery is low.  The dressing has a leak.  Explain to your caregiver what is happening. Follow the instructions you receive. WHEN SHOULD I CALL FOR HELP?   You have severe pain.  You have difficulty breathing.  You have bleeding that will not stop.  Your wound smells bad.  You have redness, swelling, or fluid leaking from your wound.  Your alarm goes off and you do not know what to do.  You have a fever.  Your wound itches severely.  Your dressing changes are often painful or bleeding often occurs.  You have diarrhea.  You have a sore throat.  You have a rash around the dressing or anywhere else on your body.  You feel nauseous.  You feel dizzy or weak.  The [facility] machine has been off for more than 2 hours. HOW DO I GET READY TO GO HOME WITH A PUMP?  A trained caregiver will talk to you and answer your questions about your vacuum-assisted closure therapy before you go home. He or she will explain what to expect. A caregiver will come to your home to apply the pump and care for your wound. The at-home caregiver will be available for questions and will come back for the scheduled dressing changes, usually every 48-72 hours (or more often for severely infected wounds). Your at-home  caregiver will also come if you are having an unexpected problem. If you have questions or do not know what to do when you go home, talk to your healthcare provider. Document Released: 04/14/2008 Document Revised: 01/02/2013 Document Reviewed: 04/15/2011 [facility] Patient Information 2015 [place], [place]. This information is not intended to replace advice given to you by your health care provider. Make sure you discuss any questions you have with your health care provider.

## 2014-01-06 NOTE — Progress Notes (Signed)
CSW received notification from RN that pt needing ambulance transport to home.  CSW met with pt at bedside to confirm address.  CSW faxed pt clinicals to [facility] and will contact [facility] with ambulance authorization once received.  CSW arranged ambulance transport to home via [facility] scheduled for 2 pm.  No further social work needs identified.  CSW signing off.   Alison Murray, MSW, [place] [facility] [phone]

## 2014-01-06 NOTE — Progress Notes (Signed)
[place] [facility]  Day 4 tigecycline  Day 4  gentamicin   Subjective: [name]    Antibiotics:  Anti-infectives   Start     Dose/Rate Route Frequency Ordered Stop   01/06/14 0000  tigecycline 50 mg in sodium chloride 0.9 % 100 mL  Status:  Discontinued     50 mg 200 mL/hr over 30 Minutes Intravenous Every 12 hours 01/06/14 1020 01/06/14    01/06/14 0000  gentamicin 600 mg in dextrose 5 % 100 mL  Status:  Discontinued     600 mg 115 mL/hr over 60 Minutes Intravenous Every 24 hours 01/06/14 1020 01/06/14    01/06/14 0000  gentamicin 600 mg in dextrose 5 % 100 mL     600 mg 115 mL/hr over 60 Minutes Intravenous Every 24 hours 01/06/14 1039     01/06/14 0000  tigecycline 50 mg in sodium chloride 0.9 % 100 mL     50 mg 200 mL/hr over 30 Minutes Intravenous Every 12 hours 01/06/14 1039     01/03/14 2156  tigecycline (TYGACIL) 50 mg in sodium chloride 0.9 % 100 mL IVPB     50 mg 200 mL/hr over 30 Minutes Intravenous Every 12 hours 01/03/14 0956     01/03/14 1200  gentamicin (GARAMYCIN) 600 mg in dextrose 5 % 100 mL IVPB     600 mg 115 mL/hr over 60 Minutes Intravenous Every 24 hours 01/03/14 1046     01/03/14 1000  tigecycline (TYGACIL) 100 mg in sodium chloride 0.9 % 100 mL IVPB     100 mg 200 mL/hr over 30 Minutes Intravenous  Once 01/03/14 0956 01/03/14 1133   01/02/14 0600  vancomycin (VANCOCIN) 1,250 mg in sodium chloride 0.9 % 250 mL IVPB  Status:  Discontinued     1,250 mg 166.7 mL/hr over 90 Minutes Intravenous Every 12 hours 01/02/14 0559 01/03/14 0956   12/31/13 0600  vancomycin (VANCOCIN) IVPB 1000 mg/200 mL premix  Status:  Discontinued     1,000 mg 200 mL/hr over 60 Minutes Intravenous Every 12 hours 12/30/13 1612 01/02/14 0559   12/30/13 1700  vancomycin (VANCOCIN) 2,500 mg in sodium chloride 0.9 % 500 mL IVPB     2,500 mg 250 mL/hr over 120 Minutes Intravenous  Once 12/30/13 1612 12/30/13 1932   12/30/13 1600  cefTRIAXone (ROCEPHIN)  2 g in dextrose 5 % 50 mL IVPB  Status:  Discontinued     2 g 100 mL/hr over 30 Minutes Intravenous Every 24 hours 12/30/13 1538 01/03/14 0956   12/30/13 1545  metroNIDAZOLE (FLAGYL) tablet 500 mg  Status:  Discontinued     500 mg Oral 3 times per day 12/30/13 1538 01/03/14 0956   12/30/13 1325  polymyxin B 500,000 Units, bacitracin 50,000 Units in sodium chloride irrigation 0.9 % 500 mL irrigation  Status:  Discontinued       As needed 12/30/13 1325 12/30/13 1417   12/28/13 0600  ceFAZolin (ANCEF) IVPB 2 g/50 mL premix     2 g 100 mL/hr over 30 Minutes Intravenous On call to O.R. 12/27/13 1621 12/29/13 0559   12/22/13 1400  vancomycin (VANCOCIN) 1,250 mg in sodium chloride 0.9 % 250 mL IVPB  Status:  Discontinued     1,250 mg 166.7 mL/hr over 90 Minutes Intravenous Every 12 hours 12/22/13 0201 12/22/13 1132   12/22/13 0800  piperacillin-tazobactam (ZOSYN) IVPB 3.375 g  Status:  Discontinued     3.375 g 12.5 mL/hr over 240  Minutes Intravenous Every 8 hours 12/22/13 0146 12/25/13 1628   12/22/13 0600  piperacillin-tazobactam (ZOSYN) IVPB 3.375 g  Status:  Discontinued     3.375 g 100 mL/hr over 30 Minutes Intravenous 3 times per day 12/22/13 0129 12/22/13 0146   12/22/13 0145  vancomycin (VANCOCIN) 2,500 mg in sodium chloride 0.9 % 500 mL IVPB     2,500 mg 250 mL/hr over 120 Minutes Intravenous  Once 12/22/13 0135 12/22/13 0428   12/21/13 2230  piperacillin-tazobactam (ZOSYN) IVPB 3.375 g     3.375 g 100 mL/hr over 30 Minutes Intravenous  Once 12/21/13 2216 12/22/13 1216   12/21/13 2215  vancomycin (VANCOCIN) IVPB 1000 mg/200 mL premix  Status:  Discontinued     1,000 mg 200 mL/hr over 60 Minutes Intravenous  Once 12/21/13 2211 12/22/13 0133      Medications: Scheduled Meds: . sodium chloride   Intravenous Once  . amLODipine  2.5 mg Oral q morning - 10a  . baclofen  20 mg Oral QID  . collagenase   Topical Daily  . docusate sodium  100 mg Oral BID  . enoxaparin (LOVENOX) injection   50 mg Subcutaneous Daily  . famotidine  20 mg Oral BID  . feeding supplement (ENSURE COMPLETE)  237 mL Oral BID BM  . feeding supplement (PRO-STAT SUGAR FREE 64)  30 mL Oral BID WC  . gentamicin  600 mg Intravenous Q24H  . metoCLOPramide  10 mg Oral TID AC  . multivitamin with minerals  1 tablet Oral q morning - 10a  . nutrition supplement (JUVEN)  1 packet Oral BID BM  . potassium chloride SA  20 mEq Oral BID  . sodium chloride  250 mL Intravenous Once  . tigecycline (TYGACIL) IVPB  50 mg Intravenous Q12H  . vitamin C  500 mg Oral q morning - 10a  . zinc sulfate  220 mg Oral q morning - 10a   Continuous Infusions:   PRN Meds:.acetaminophen, acetaminophen, alum & mag hydroxide-simeth, HYDROmorphone (DILAUDID) injection, ondansetron (ZOFRAN) IV, ondansetron, oxyCODONE, phenol, sodium chloride    Objective: Weight change:   Intake/Output Summary (Last 24 hours) at 01/06/14 1426 Last data filed at 01/06/14 1344  Gross per 24 hour  Intake   2020 ml  Output   5501 ml  Net  -3481 ml   Blood pressure 123/77, pulse 88, temperature 99.6 F (37.6 C), temperature source Axillary, resp. rate 20, height 6' (1.829 m), weight 226 lb (102.513 kg), SpO2 99.00%. Temp:  [98.3 F (36.8 C)-99.6 F (37.6 C)] 99.6 F (37.6 C) (08/24 0524) Pulse Rate:  [88-101] 88 (08/24 0524) Resp:  [18-20] 20 (08/24 0524) BP: (101-123)/(58-77) 123/77 mmHg (08/24 0524) SpO2:  [98 %-99 %] 99 % (08/24 0524)  Physical Exam: General: Alert and awake, oriented x3, not in any acute distress. HEENT: anicteric sclera, EOMI CVS regular rate, normal  Chest: No wheezing no respiratory distress Abdomen: soft nontender, nondistended, normal bowel sounds, Skin: wound vaccums in place  Neuro: Quadriplegic  CBC:  Recent Labs Lab 01/02/14 0500 01/02/14 0625 01/02/14 1645 01/03/14 0515 01/05/14 0515 01/06/14 0515  HGB 5.1* 5.4* 8.3* 8.2* 8.2* 8.5*  HCT 17.2* 17.8* 25.8* 25.6* 26.2* 27.8*  PLT 409*  --  451*  418* 452* 422*     BMET  Recent Labs  01/05/14 0515 01/06/14 0515  NA 141 140  K 4.6 4.5  CL 106 104  CO2 25 25  GLUCOSE 94 104*  BUN 25* 21  CREATININE 0.52 0.43*  CALCIUM 8.8 8.8     Liver Panel  No results found for this basename: PROT, ALBUMIN, AST, ALT, ALKPHOS, BILITOT, BILIDIR, IBILI,  in the last 72 hours     Sedimentation Rate No results found for this basename: ESRSEDRATE,  in the last 72 hours C-Reactive Protein No results found for this basename: CRP,  in the last 72 hours  Micro Results: Recent Results (from the past 720 hour(s))  CULTURE, BLOOD (ROUTINE X 2)     Status: None   Collection Time    12/21/13 11:35 PM      Result Value Ref Range Status   Specimen Description BLOOD LEFT HAND   Final   Special Requests BOTTLES DRAWN AEROBIC ONLY 5CC   Final   Culture  Setup Time     Final   Value: 12/22/2013 13:02     Performed at [facility]   Culture     Final   Value: NO GROWTH 5 DAYS     Performed at [facility]   Report Status 12/28/2013 FINAL   Final  CULTURE, BLOOD (ROUTINE X 2)     Status: None   Collection Time    12/21/13 11:40 PM      Result Value Ref Range Status   Specimen Description BLOOD LEFT HAND   Final   Special Requests BOTTLES DRAWN AEROBIC ONLY 5CC   Final   Culture  Setup Time     Final   Value: 12/22/2013 13:02     Performed at [facility]   Culture     Final   Value: NO GROWTH 5 DAYS     Performed at [facility]   Report Status 12/28/2013 FINAL   Final  CULTURE, BLOOD (ROUTINE X 2)     Status: None   Collection Time    12/22/13  6:30 PM      Result Value Ref Range Status   Specimen Description BLOOD RIGHT LINE  5 ML IN [facility] BOTTLE   Final   Special Requests Immunocompromised   Final   Culture  Setup Time     Final   Value: 12/23/2013 00:06     Performed at [facility]   Culture     Final   Value: NO GROWTH 5 DAYS     Performed at [facility]   Report Status  12/29/2013 FINAL   Final  URINE CULTURE     Status: None   Collection Time    12/23/13  6:21 PM      Result Value Ref Range Status   Specimen Description URINE, CATHETERIZED   Final   Special Requests Immunocompromised   Final   Culture  Setup Time     Final   Value: 12/24/2013 00:28     Performed at [place]     Final   Value: 25,000 COLONIES/ML     Performed at [facility]   Culture     Final   Value: PSEUDOMONAS AERUGINOSA     Performed at [facility]   Report Status 12/26/2013 FINAL   Final   Organism ID, Bacteria PSEUDOMONAS AERUGINOSA   Final  ANAEROBIC CULTURE     Status: None   Collection Time    12/30/13  1:50 PM      Result Value Ref Range Status   Specimen Description SACRAL RIGHT ISCHIUM   Final   Special Requests NONE   Final  Gram Stain     Final   Value: NO WBC SEEN     NO SQUAMOUS EPITHELIAL CELLS SEEN     NO ORGANISMS SEEN     Performed at [facility]   Culture     Final   Value: NO ANAEROBES ISOLATED     Performed at [facility]   Report Status 01/04/2014 FINAL   Final  CULTURE, ROUTINE-ABSCESS     Status: None   Collection Time    12/30/13  1:50 PM      Result Value Ref Range Status   Specimen Description SACRAL RIGHT ISCHIUM   Final   Special Requests NONE   Final   Gram Stain     Final   Value: NO WBC SEEN     NO SQUAMOUS EPITHELIAL CELLS SEEN     NO ORGANISMS SEEN     Performed at [facility]   Culture     Final   Value: FEW ACINETOBACTER CALCOACETICUS/BAUMANNII COMPLEX     1 Note: ZERBAXA 49mcg/mL ETEST results for this drug are "FOR INVESTIGATIONAL USE ONLY" and should NOT be used for clinical purposes. COLISTIN SENSITIVE 0.25ug/mL TIGECYCLINE SENSITIVE     FEW ENTEROCOCCUS SPECIES     Performed at [facility]   Report Status 01/04/2014 FINAL   Final   Organism ID, Bacteria ACINETOBACTER CALCOACETICUS/BAUMANNII COMPLEX   Final   Organism ID, Bacteria ENTEROCOCCUS  SPECIES   Final   Organism ID, Bacteria ENTEROCOCCUS SPECIES   Final    Studies/Results: No results found.    Assessment/Plan:  Principal Problem:   Infected decubitus ulcer Active Problems:   Quadriplegia   Seizure disorder   HTN (hypertension)   OSA on CPAP   Anemia   UTI (lower urinary tract infection)   Wound infection   Decubitus ulcer of ischium, stage 3   Decubitus ulcer of sacral region, stage 3   Decubitus ulcer of back, stage 3   Decubitus ulcer of lower extremity, stage 2    [name] [name] is a 48 y.o. male with with Quadraplegia, pelvic osteomyelitis, mx decubitus ulcers with recent worsening drainage from ulcers last week and now foul smelling urine. He is sp I and D by Dr. [name] for osteo and abscesses in pelvic region.  #1 Decubitus ulcers imaged with MRI and nowfound to have new abscesses on right near femur and ilium--likely what had previously suddenly started draining. Chronic osteomyelitis present: He is sp surgery  by Dr. [name] with I and D and deep cultures Greatly appreciate Dr. [name] help with this patient.  CONTINUE TYGACIL AND WILL DROP THE GENTAMICIN TO AVOID NEPHROTOXICITY  TREAT FOR 8 WEEKS  HE ALREADY HAS FOLLOWUP WITH DR. HATCHER IN EARLY September 9th  Fax weekly cbc bmp to [name] @ [phone].      LOS: 16 days   Alcide Evener 01/06/2014, 2:26 PM

## 2014-01-06 NOTE — Progress Notes (Signed)
ANTIBIOTIC CONSULT NOTE - Follow up  Pharmacy Consult for gentamicin Indication: acinetobacter in abscess culture  Allergies  Allergen Reactions  . Ditropan [Oxybutynin] Other (See Comments)    hallucinations    Patient Measurements: Height: 6' (182.9 cm) Weight: 226 lb (102.513 kg) IBW/kg (Calculated) : 77.6 Adjusted Body Weight: 88kg   Recent Labs  01/03/14 2330  GENTRANDOM 2.8     Assessment: 48 y.o. male admitted 8/9 for evaluation of strong smelling dark urine, and drainage from his buttocks wounds, known osteomyelitis. Quadriparetic, well known to Psychiatric nurse. He saw his wound management doctor recently for evaluation and treatment of his chronic sacral decubiti. Patient is well-known to Pharmacy for antibitoic consults, Pharmacy now consulted to dose gentamicin for MDR acinetobacter in abscess culture. ID is following and dosing tigecycline.  Will use gentamicin extended interval dosing (7mg /kg ABW) q24h with CrCl > 18ml/min. Will get gentamicin trough value prior to second dose as patient is quadriplegic and CrCl is likely overestimated using CG formula.  Antiinfectives 8/9 >> vancomycin >> 8/9 8/9 >> zosyn >> 8/12 8/12 >> fosfomycin x 1 8/17 >> vancomycin >> 8/21 8/17 >> ceftriaxone >> 8/21 8/17 >> flagyl >> 8/21 8/21 >> tigecycline >> 8/21 >> gentamicin >>  Labs / vitals Tmax: remains afebrile WBCs: remains elevated, 12.4 Renal: SCr low/stable, CrCl > 100 ml/min CG/N  Microbiology 8/8 UA: grossly positive for UTI 8/8 blood x2: NGF 8/9 blood: NGF 8/10 urine: 25k Pseudomonas RESISTANT to zosyn (sensitive only to AG, imip) 8/17 abscess: few acinetobacter calcoaceticus/baumannii (S=AG, colisitn, tigecycline, Zerbaxa), few enterococcus (S=amp,vanc). No anaerobes. --- MRI on 8/12 showed new fluid collections in the right buttock, highly suspicious for abscesses, as well as sacral decubitus ulcer without underlying abscess, and also osteolysis and chronic  osteomyelitis of both hips. Chronic osteomyelitis deep to the patient's sacral decubitus ulcer is also identified.  Dose changes / levels 8/20 VT @0500 = 13.5 on 1g q12h, incr to 1250 q12h 8/21 gent level 10.5hrs after dose = 2.8 on 600mg  q24h, cont same   Goal of Therapy:  Gentamicin trough < 2 mcg/ml  Plan:  - continue gentamicin 600mg  IV q24h - follow-up clinical course, culture results, renal function - follow-up antibiotic de-escalation and length of therapy  Thank you for the consult.  Currie Paris, PharmD, BCPS Pager: [phone] Pharmacy: [phone] 01/06/2014 1:22 PM

## 2014-01-06 NOTE — Care Management (Signed)
CARE MANAGEMENT NOTE 01/06/2014  Patient:  Roye,Jonatan   Account Number:  [id number]  Date Initiated:  12/25/2013  Documentation initiated by:  [facility]  Subjective/Objective Assessment:   adm: UTI/decubitus ulcer     Action/Plan:   discharge planning   Anticipated DC Date:  01/06/2014   Anticipated DC Plan:  [place]  CM consult      [facility] Choice  Resumption Of Svcs/PTA Provider   Choice offered to / List presented to:     DME arranged  VAC      DME agency  [facility]     HH arranged  HH-1 RN      [place].   Status of service:  In process, will continue to follow Medicare Important Message given?  YES (If response is "NO", the following Medicare IM given date fields will be blank) Date Medicare IM given:  12/24/2013 Medicare IM given by:   Date Additional Medicare IM given:  01/06/2014 Additional Medicare IM given by:  Leafy Kindle  Discharge Disposition:  [place]  Per UR Regulation:  Reviewed for med. necessity/level of care/duration of stay  If discussed at [place] of Stay Meetings, dates discussed:    Comments:  01/06/14 Marney Doctor RN,BSN,NCM [facility] home vac in patient room.  [facility] Erasmo Downer called to confirm patient discharge and confirm all HH needs in place.  SW alerted for ambulance transport.  Patient appreciative of CM services.  01/02/14 16:10 CM called [facility] rep, Rickie to confirm fax number.  CM faxed order form, facesheet, OP note, measurements, and H&P to [place].  CM will continue to follow.  Mariane Masters, BSN, [place].  01/01/14 12:00 CM spoke with pt who states he has had a wound Vac before from [facility] and home IV ABX.  CM waiting for IV ABX orders.  CM called [facility] rep for IV ABX arrangements. CM placed [facility] request form on shadow chart for MD to complete.  Will continue to follow for disposition.  Mariane Masters, BSN, [place] [phone].  12/25/13 08:00 Pt is from home  with personal caretaker (paraplegia); and is active with [facility] for [facility].  Will follow for disposition needs.  Have requested resumption order for MD for [facility].  Mariane Masters, BSN, CM [phone].

## 2014-01-07 ENCOUNTER — Telehealth: Payer: Self-pay | Admitting: *Deleted

## 2014-01-07 ENCOUNTER — Telehealth: Payer: Self-pay | Admitting: Physician Assistant

## 2014-01-07 NOTE — Telephone Encounter (Signed)
Delena Serve, RN with [place] called to clarify [place] orders. Patient went home with [facility] dressing in place, but did not have the same negative pressure machine when arrived home, so the [facility] removed the VAC dressing and applied a saline wet to dry dressing to the area and called to clarify orders. Dr.Sanger was notified of same and Dr. [name] has recommended continue saline wet to dry dressings until she can re-evaluate the areas in the [facility] next week.

## 2014-01-07 NOTE — Telephone Encounter (Signed)
Call from [place] with [place]. When she went out for IV training with patient he told her that Dr. Tommy Medal had said to stop the Gentomycin when he was discharged from the hospital. There is a hospital note stating this but the pharmacy did not have this order. Michaell Cowing with pharmacy to page Dr. Tommy Medal. Per note he is to continue the tigecycline. [name]

## 2014-01-13 DIAGNOSIS — L8993 Pressure ulcer of unspecified site, stage 3: Secondary | ICD-10-CM | POA: Diagnosis not present

## 2014-01-13 DIAGNOSIS — L8994 Pressure ulcer of unspecified site, stage 4: Secondary | ICD-10-CM | POA: Diagnosis not present

## 2014-01-13 DIAGNOSIS — L89309 Pressure ulcer of unspecified buttock, unspecified stage: Secondary | ICD-10-CM | POA: Diagnosis not present

## 2014-01-13 DIAGNOSIS — L89109 Pressure ulcer of unspecified part of back, unspecified stage: Secondary | ICD-10-CM | POA: Diagnosis present

## 2014-01-13 DIAGNOSIS — L89899 Pressure ulcer of other site, unspecified stage: Secondary | ICD-10-CM | POA: Diagnosis not present

## 2014-01-14 NOTE — Progress Notes (Signed)
[facility]  NAME:  [name] [name], [name] [name] NO.:  [id number]  MEDICAL RECORD NO.:  [id number]      DATE OF BIRTH:  01-29-1966  PHYSICIAN:  [name], DO       VISIT DATE:  01/13/2014                                  OFFICE VISIT   The patient is a 48 year old male who is here for followup on his sacral and back ulcers.  He was admitted and he underwent debridement with ACell placement and VAC on the back.  He is back at home doing very well and is pleasant as usual.  There is no change in his medications or social history.  He is alert, oriented, cooperative, not in any distress.  The wounds appear to be slightly improved and clean.  No sign of infection.  No drainage.  We will continue with local dressing care. We will do collagen on the sacral area and the back and then in addition the VAC on the back.     [name], DO     CS/MEDQ  D:  01/13/2014  T:  01/14/2014  Job:  [id number]

## 2014-01-22 ENCOUNTER — Ambulatory Visit: Payer: Medicare Other | Admitting: Infectious Diseases

## 2014-02-04 ENCOUNTER — Encounter: Payer: Self-pay | Admitting: Infectious Disease

## 2014-02-05 ENCOUNTER — Encounter: Payer: Self-pay | Admitting: Infectious Disease

## 2014-02-10 ENCOUNTER — Encounter (HOSPITAL_BASED_OUTPATIENT_CLINIC_OR_DEPARTMENT_OTHER): Payer: Medicare Other | Attending: Plastic Surgery

## 2014-02-10 DIAGNOSIS — L89109 Pressure ulcer of unspecified part of back, unspecified stage: Secondary | ICD-10-CM | POA: Insufficient documentation

## 2014-02-10 DIAGNOSIS — L8990 Pressure ulcer of unspecified site, unspecified stage: Secondary | ICD-10-CM | POA: Insufficient documentation

## 2014-02-11 NOTE — Progress Notes (Signed)
[facility]  NAME:  [name] [name], [name] [name] NO.:  [id number]  MEDICAL RECORD NO.:  [id number]      DATE OF BIRTH:  Jun 17, 1965  PHYSICIAN:  [name], DO       VISIT DATE:  02/10/2014                                  OFFICE VISIT   The patient is a 48 year old male who is here for followup on his sacral ulcers and ischial ulcers.  He has the VAC on.  He does have some home health and some help at home.  He has the air mattress bed, but it has been giving him some trouble lately.  REVIEW OF SYSTEMS:  Negative.  Social history and medications are unchanged.  On exam, he is very pleasant.  His pupils are equal.  His breathing is unlabored.  His heart rate is regular.  The wounds, they are all noted in the chart for their size and description.  Debridement was done, there showing some signs of improvement.  We will check a prealbumin. We will continue with the VAC, offloading, check on his air mattress bed.  Continue protein and follow up in 3 weeks.     [name], DO     CS/MEDQ  D:  02/10/2014  T:  02/11/2014  Job:  [id number]

## 2014-02-17 ENCOUNTER — Encounter: Payer: Self-pay | Admitting: Infectious Diseases

## 2014-02-17 ENCOUNTER — Ambulatory Visit (INDEPENDENT_AMBULATORY_CARE_PROVIDER_SITE_OTHER): Payer: Medicare Other | Admitting: Infectious Diseases

## 2014-02-17 VITALS — BP 86/50 | HR 102 | Temp 98.5°F

## 2014-02-17 DIAGNOSIS — L89154 Pressure ulcer of sacral region, stage 4: Secondary | ICD-10-CM

## 2014-02-17 DIAGNOSIS — Z23 Encounter for immunization: Secondary | ICD-10-CM

## 2014-02-17 NOTE — Progress Notes (Signed)
Subjective:    Patient ID: [name] [name], male    DOB: 1965/07/12, 48 y.o.   MRN: [id number]  HPI 48 y.o. M with quadraplegia (C5 fracture 1991) and nonhealing sacral wound for the past 3 years. He has had multiple prolonged courses of antibiotic therapy recently. He was hospitalized on August 02, 2011 with right hip osteomyelitis. Was transferred to [facility] where a hip aspirate grew methicillin sensitive coagulase-negative staph and Candida. He received a long course of IV ertapenem and oral fluconazole. He was readmitted from April 19-25 and switched to vancomycin and ertapenem and received continued therapy. He was admitted again from June 25 to July 2. Bone biopsy grew Enterobacter and he was started on ertapenem again received 8 weeks of therapy completing that on September 1st. He was seen by Dr. Megan Salon and Dr. Baxter Flattery and they felt more likely admission complaints of fever, etc were related to pseudomonas UTI rather than his chronic pelvic infection.  He then returned 02-20-13 with fevers. He was found on CT to have an abscess 6.8 x 3.6 x 5.9 c with gas in hip joint, left side, with no changes in chronic right sacral decub and air tracking to right hip joint. He had urine and blood cultures drawn and placed on vancomycin and imipenem.  His UCx grew > 100k E cloacae (S-aminoglycosides, FLQ, Bactrim) for which he received fosfomycin.  His abscess was aspirated by IR on 02-22-13 and grew E coli (R flq and bactrim). He was treated with IV ceftriaxone and oral flagyl with a plan for 6 weeks of therapy (end Nov 19).  He was seen in ID clinic in f/u 03-25-13. He was doing well.  He was seen in ID clinic 04-24-13 with concerns about increasing wound d/c. He was sent to [facility], not restated on anbx.  Was seen in ID on 05-27-13, he was concerned about increasing wound d/c. He was kept off anbx. He continued to have f/u with [facility].  On 06-21-13 he was admitted to the hospital  with fever. He was started on vanco/zosyn. He underwent MRI: 1. There are chronic destructive osseous changes of bilateral hips with irregular soft tissue enhancement within bilateral hip joints and adjacent fluid collections. There is no significant marrow signal abnormality. The overall appearance is consistent with chronic osteomyelitis with the fluid collections concerning for abscesses. Overall there is no significant changes in the appearance of bilateral hips compared with the CT of the pelvis performed on 02/27/2013. 2. There is a sacral decubitus ulcer overlying the left SI joint with mild marrow edema of the posterior left sacrum and ilium with enhancement concerning for osteomyelitis. He underwent IR drain of his fluid collection, Cx was (-). He had UCx which grew Providencia, Pseudomonas. He was d/c home on 2-16. He was seen in ID clinic on 3-16 and was doing well. He had surgical f/u (not a surgical candidate) and was then transitioned to PO doxy (08-13-13).  He had ID f/u planned for late April but changed this to get second opinion at [facility]. He was continued on his doxy until admitted.  Comes to ED 5-12 worsening decubitus drainage. He was told this had increased particulate d/c. In ED was found to be hypotensive and WBC 11. His pelvic MRI was repeated and felt to be unchanged from his previous in October 2014. He was started on vanco/zosyn. Since admission has been noted to be more anemic.  He had Bcx 5-11 (ngtd), pyuria/bacturia on UA,  UCx (Providencia), Wound C (staph aureus).  He was transitioned to ceftriaxone alone for a short course (~ 1 week) then transitioned to PO bactrim. Was able to be d/c home on 09-30-13. At ID f/u he was contiued on PO bactrim, doing well.  On 12-21-13 he came to [facility] with increasing wound d/c and foul urine. He was found to have new abscess near R femur, ileum. He was taken to OR by Dr [name] and had debridement. His OR Cx grew A baumanii (S- colistin, tygacil) and  Enterococcus (S- vanco, amp). His UCx grew P. Aeruginosa (S- gent, imipenem, tobra). He also had wound on his R shoulder, prev wound vac.  He will complete 8 weeks of this 12-21-13. He had f/u with Dr [name] last week and was felt to be doing well. His wound has been documented in terms of size by home health. Has been collagen placed on his sacral wound.  No f/c. No problems with his PIC line.    Review of Systems     Objective:   Physical Exam  Constitutional: He appears well-developed and well-nourished.  HENT:  Mouth/Throat: No oropharyngeal exudate.  Eyes: EOM are normal. Pupils are equal, round, and reactive to light.  Cardiovascular: Normal rate, regular rhythm and normal heart sounds.   Pulmonary/Chest: Effort normal and breath sounds normal.  R chest PIC line is clean, non-tender.   Skin:  I am unable to see his decubitus wounds as pt is not able to get out of motorized wheel chair.           Assessment & Plan:

## 2014-02-17 NOTE — Assessment & Plan Note (Signed)
Greatly appreciate Dr Leafy Ro continued f/u. He will complete his anbx as planned on 02-20-14. He is not aware of any plan for muscle flap or skin grafting into his wound. There are no good oral agents for the acinetobacter, the enterococcus could be treated with amox. Will see him back in 2 months.

## 2014-02-27 ENCOUNTER — Telehealth: Payer: Self-pay | Admitting: *Deleted

## 2014-02-27 NOTE — Telephone Encounter (Signed)
Nurse caring for the patient called to advise he is done with his IV medication and due to where and how his PICC is placed he will need to have an appt at IR to have it removed. She advised to call the patient once the appt is set. Advised her as soon as the doctor advises ok and puts in the order to have it removed we will make the appt and give the patient a call.

## 2014-03-01 IMAGING — CR DG CHEST 2V
2 series · 2 of 2 positions shown · non-contrast
Comparison: 07/01/2011

CLINICAL DATA: Fever.  Quadriplegia.

CHEST - 2 VIEW

[w chest lat]
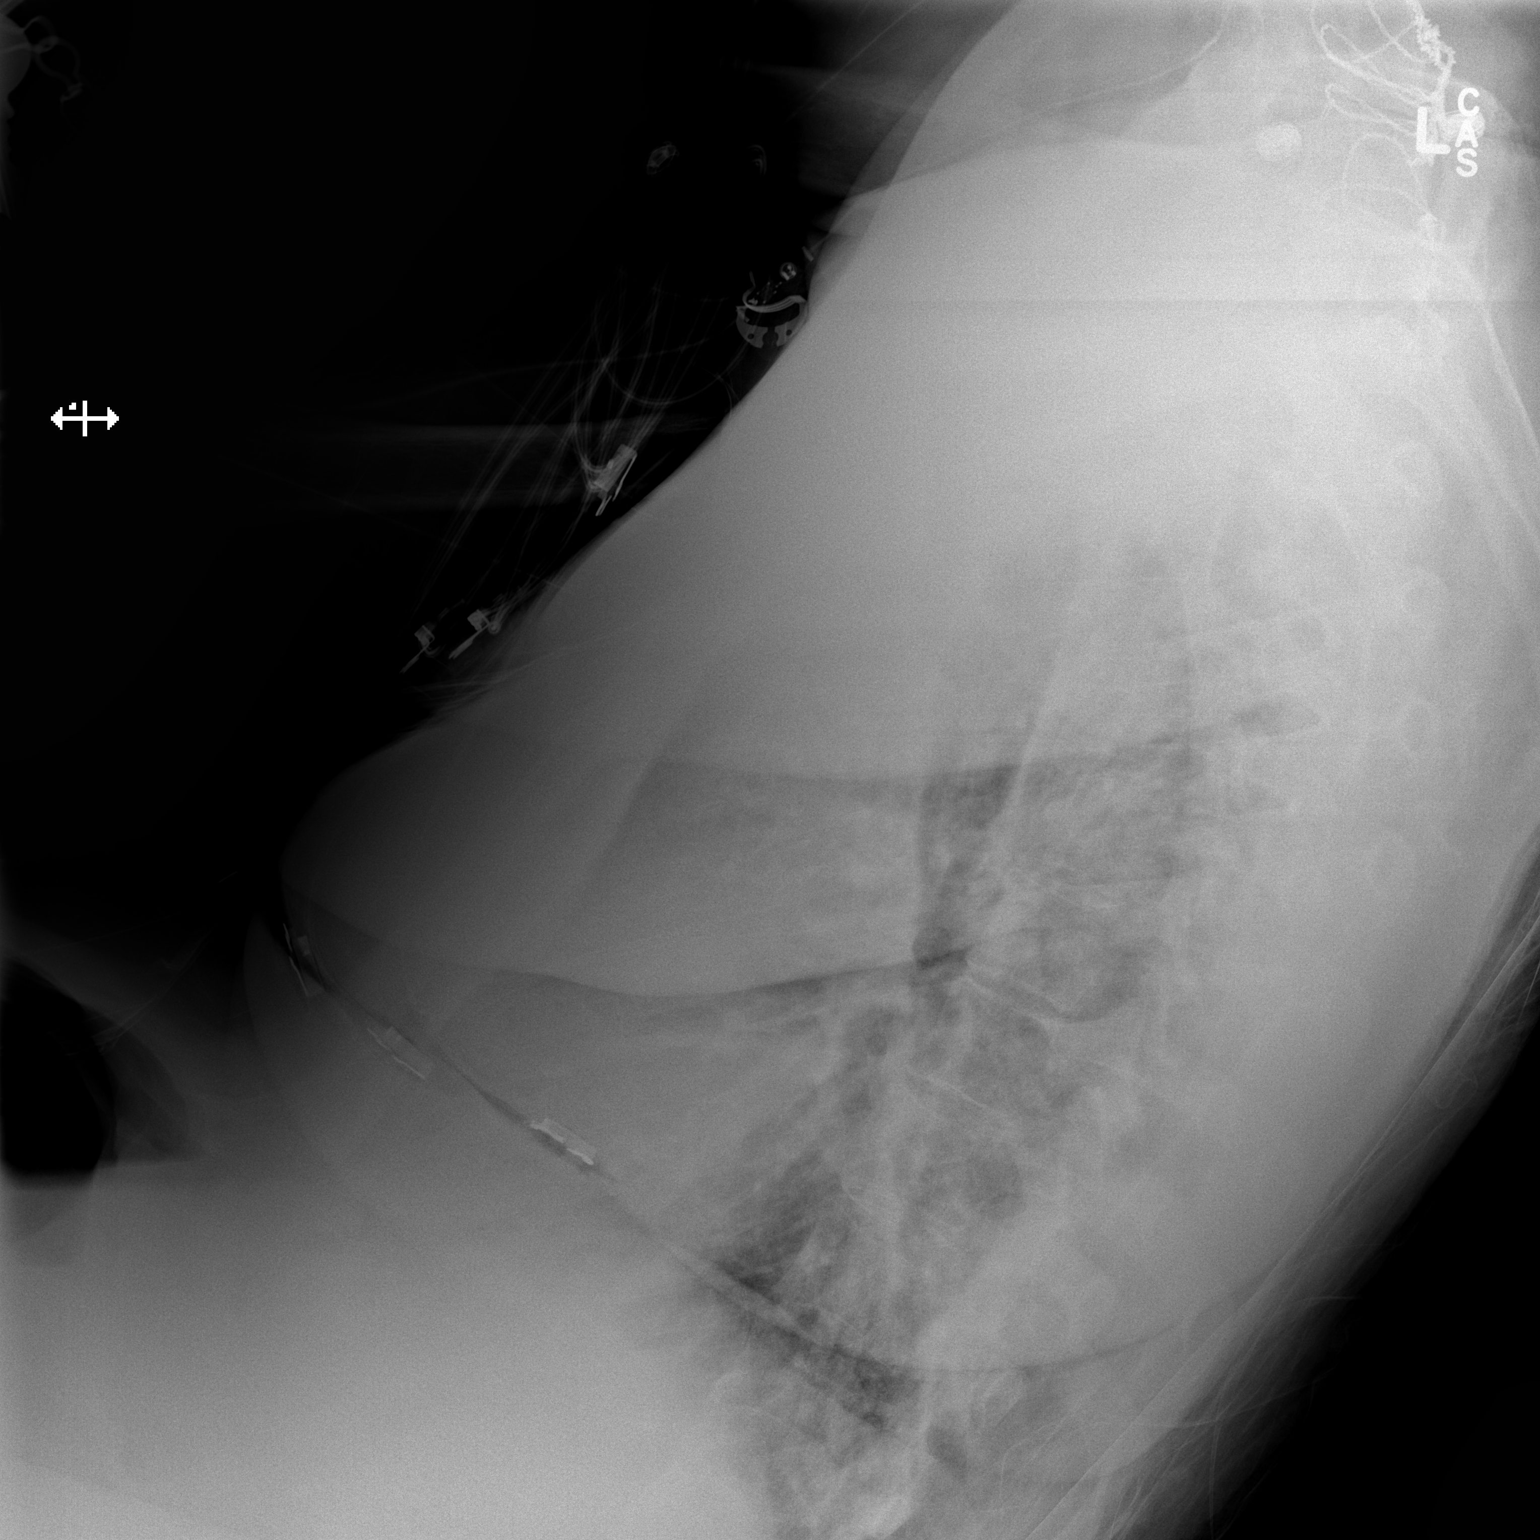

[x chest ap]
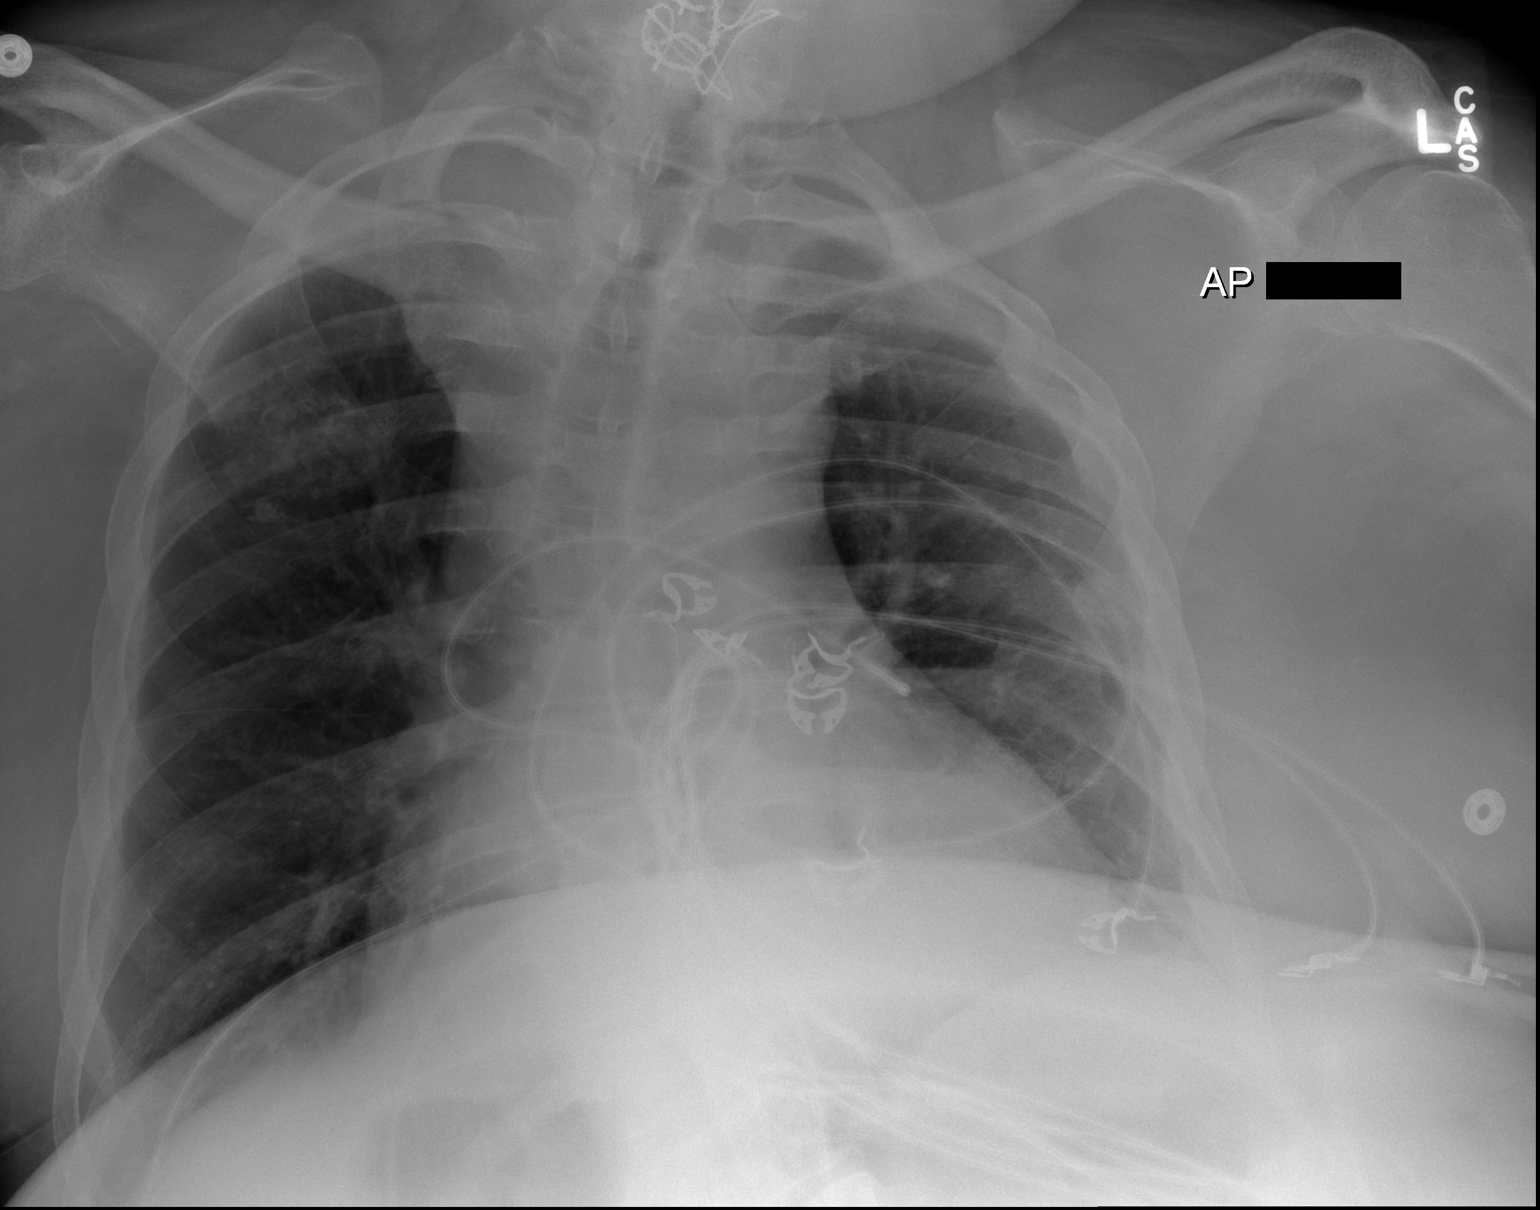

[2 of 2 positions shown; findings below may reference images not displayed]

FINDINGS: Lung bases are partially obscured by the patient's
protruding abdominal tissues.

Left pleural thickening may be from pleural adipose tissue or
pleural effusion.  Bony irregularity along the inferior tip of
right scapula is again noted.

Cardiomegaly is present.  Old left rib deformities are noted.
Pleural thickening noted at the right lung apex.

Motion artifact and body habitus obscuring the lungs on the lateral
projection.  There may be some faint left lower lobe airspace
opacity.
IMPRESSION: 1.  Equivocal left lower lobe airspace opacity, although the left
hemidiaphragm remains well seen. This could represent atelectasis
or pneumonia.
2.  Bony deformities include stable left rib deformities and stable
right inferior scapular deformity.
3.  The lung bases are partially obscured by the patient's
protuberant abdominal tissues.
4.  Mild cardiomegaly.
5.  Pleural thickening at the right lung apex and along the left
chest, possibly some pleural adipose tissue or pleural fluid.
5.  Body habitus and motion artifact reduce diagnostic sensitivity
and specificity.

## 2014-03-04 ENCOUNTER — Other Ambulatory Visit: Payer: Self-pay | Admitting: Infectious Diseases

## 2014-03-04 DIAGNOSIS — L8990 Pressure ulcer of unspecified site, unspecified stage: Secondary | ICD-10-CM

## 2014-03-04 NOTE — Telephone Encounter (Signed)
Sub-clavian PICC - needs radiology removal appt.  MD please advise ASAP.  Last IV antibiotic over a week ago.  Rn worried about additional infection to sacral wound.  Please advise.

## 2014-03-04 NOTE — Telephone Encounter (Signed)
Please remove PIC thanks

## 2014-03-05 ENCOUNTER — Other Ambulatory Visit: Payer: Self-pay | Admitting: Infectious Diseases

## 2014-03-05 DIAGNOSIS — M869 Osteomyelitis, unspecified: Secondary | ICD-10-CM

## 2014-03-05 NOTE — Telephone Encounter (Signed)
Scheduled with Anderson Malta at [facility] for tomorrow 03/06/14 at 12:30 PM. Patient has been notified.

## 2014-03-06 ENCOUNTER — Ambulatory Visit (HOSPITAL_COMMUNITY)
Admission: RE | Admit: 2014-03-06 | Discharge: 2014-03-06 | Disposition: A | Discharge status: Home / Self Care | Payer: Medicare Other | Source: Ambulatory Visit | Attending: Infectious Diseases | Admitting: Infectious Diseases

## 2014-03-06 DIAGNOSIS — Z452 Encounter for adjustment and management of vascular access device: Secondary | ICD-10-CM | POA: Diagnosis present

## 2014-03-06 DIAGNOSIS — M869 Osteomyelitis, unspecified: Secondary | ICD-10-CM

## 2014-03-06 MED ORDER — CHLORHEXIDINE GLUCONATE 4 % EX LIQD
CUTANEOUS | Status: AC
  Filled 2014-03-06: qty 15

## 2014-03-06 MED ORDER — LIDOCAINE HCL 1 % IJ SOLN
INTRAMUSCULAR | Status: DC
Start: 2014-03-06 — End: 2014-03-07
  Filled 2014-03-06: qty 20

## 2014-03-06 NOTE — Telephone Encounter (Signed)
[facility] Rn informed that PICC to be removed.

## 2014-03-17 ENCOUNTER — Encounter (HOSPITAL_BASED_OUTPATIENT_CLINIC_OR_DEPARTMENT_OTHER): Payer: Medicare Other | Attending: Plastic Surgery

## 2014-03-24 ENCOUNTER — Encounter (HOSPITAL_BASED_OUTPATIENT_CLINIC_OR_DEPARTMENT_OTHER): Payer: Medicare Other | Attending: Plastic Surgery

## 2014-03-24 DIAGNOSIS — L98419 Non-pressure chronic ulcer of buttock with unspecified severity: Secondary | ICD-10-CM | POA: Diagnosis present

## 2014-03-24 NOTE — Progress Notes (Signed)
[facility]  NAME:  COPPER, BASNETT NO.:  [id number]  MEDICAL RECORD NO.:  [id number]      DATE OF BIRTH:  [birth year]  PHYSICIAN:  [name], DO       VISIT DATE:  03/24/2014                                  OFFICE VISIT   The patient is a 48 year old male who is here for followup on his multiple sacral ischial ulcers.  There is no change in his medications or social history.  He states that he does have some help at home.  He is extremely pleasant and not in any distress.  His pupils are equal.  His breathing is unlabored.  His heart rate is regular.  The wounds are better than I have ever seen them.  They are filling in and almost flushed with the rest of the skin.  There was 1 area that I debrided with the curette and agree with continuing collagen VAC and follow up in 6 weeks.  Also, stressed the importance of protein which he states he has been doing well.  Also, he was evaluated for wheelchair padding and bed padding, and it appears that he is eligible for upgrade or at least new, and I will fill out the forms as soon as I get him.     [name], DO     CS/MEDQ  D:  03/24/2014  T:  03/24/2014  Job:  [id number]

## 2014-04-14 ENCOUNTER — Ambulatory Visit: Payer: Medicare Other | Admitting: Infectious Diseases

## 2014-04-17 ENCOUNTER — Encounter (HOSPITAL_COMMUNITY): Payer: Self-pay | Admitting: Emergency Medicine

## 2014-04-17 ENCOUNTER — Inpatient Hospital Stay (HOSPITAL_COMMUNITY): Payer: Medicare Other

## 2014-04-17 ENCOUNTER — Emergency Department (HOSPITAL_COMMUNITY): Payer: Medicare Other

## 2014-04-17 ENCOUNTER — Inpatient Hospital Stay (HOSPITAL_COMMUNITY)
Admission: EM | Admit: 2014-04-17 | Discharge: 2014-04-22 | DRG: 388 | Disposition: A | Discharge status: Home / Self Care | Payer: Medicare Other | Attending: Internal Medicine | Admitting: Internal Medicine

## 2014-04-17 DIAGNOSIS — K567 Ileus, unspecified: Principal | ICD-10-CM

## 2014-04-17 DIAGNOSIS — L89154 Pressure ulcer of sacral region, stage 4: Secondary | ICD-10-CM | POA: Diagnosis present

## 2014-04-17 DIAGNOSIS — R600 Localized edema: Secondary | ICD-10-CM | POA: Diagnosis present

## 2014-04-17 DIAGNOSIS — J9610 Chronic respiratory failure, unspecified whether with hypoxia or hypercapnia: Secondary | ICD-10-CM | POA: Diagnosis present

## 2014-04-17 DIAGNOSIS — G8250 Quadriplegia, unspecified: Secondary | ICD-10-CM | POA: Diagnosis present

## 2014-04-17 DIAGNOSIS — Z6831 Body mass index (BMI) 31.0-31.9, adult: Secondary | ICD-10-CM | POA: Diagnosis not present

## 2014-04-17 DIAGNOSIS — K8020 Calculus of gallbladder without cholecystitis without obstruction: Secondary | ICD-10-CM | POA: Diagnosis present

## 2014-04-17 DIAGNOSIS — K3184 Gastroparesis: Secondary | ICD-10-CM | POA: Diagnosis present

## 2014-04-17 DIAGNOSIS — Z87440 Personal history of urinary (tract) infections: Secondary | ICD-10-CM

## 2014-04-17 DIAGNOSIS — D473 Essential (hemorrhagic) thrombocythemia: Secondary | ICD-10-CM | POA: Diagnosis present

## 2014-04-17 DIAGNOSIS — K219 Gastro-esophageal reflux disease without esophagitis: Secondary | ICD-10-CM | POA: Diagnosis present

## 2014-04-17 DIAGNOSIS — G4733 Obstructive sleep apnea (adult) (pediatric): Secondary | ICD-10-CM

## 2014-04-17 DIAGNOSIS — L89113 Pressure ulcer of right upper back, stage 3: Secondary | ICD-10-CM | POA: Diagnosis present

## 2014-04-17 DIAGNOSIS — D638 Anemia in other chronic diseases classified elsewhere: Secondary | ICD-10-CM | POA: Diagnosis present

## 2014-04-17 DIAGNOSIS — E669 Obesity, unspecified: Secondary | ICD-10-CM | POA: Diagnosis present

## 2014-04-17 DIAGNOSIS — L8990 Pressure ulcer of unspecified site, unspecified stage: Secondary | ICD-10-CM

## 2014-04-17 DIAGNOSIS — Z888 Allergy status to other drugs, medicaments and biological substances status: Secondary | ICD-10-CM

## 2014-04-17 DIAGNOSIS — R112 Nausea with vomiting, unspecified: Secondary | ICD-10-CM

## 2014-04-17 DIAGNOSIS — Z8711 Personal history of peptic ulcer disease: Secondary | ICD-10-CM

## 2014-04-17 DIAGNOSIS — J45909 Unspecified asthma, uncomplicated: Secondary | ICD-10-CM | POA: Diagnosis present

## 2014-04-17 DIAGNOSIS — I10 Essential (primary) hypertension: Secondary | ICD-10-CM

## 2014-04-17 DIAGNOSIS — G40909 Epilepsy, unspecified, not intractable, without status epilepticus: Secondary | ICD-10-CM | POA: Diagnosis present

## 2014-04-17 DIAGNOSIS — R140 Abdominal distension (gaseous): Secondary | ICD-10-CM

## 2014-04-17 DIAGNOSIS — Z933 Colostomy status: Secondary | ICD-10-CM

## 2014-04-17 DIAGNOSIS — I878 Other specified disorders of veins: Secondary | ICD-10-CM

## 2014-04-17 DIAGNOSIS — Z9359 Other cystostomy status: Secondary | ICD-10-CM

## 2014-04-17 DIAGNOSIS — R1110 Vomiting, unspecified: Secondary | ICD-10-CM

## 2014-04-17 LAB — CBC WITH DIFFERENTIAL/PLATELET
Basophils Absolute: 0 10*3/uL (ref 0.0–0.1)
Basophils Relative: 0 % (ref 0–1)
Eosinophils Absolute: 0 10*3/uL (ref 0.0–0.7)
Eosinophils Relative: 0 % (ref 0–5)
HCT: 32.9 % — ABNORMAL LOW (ref 39.0–52.0)
HEMOGLOBIN: 10.4 g/dL — AB (ref 13.0–17.0)
LYMPHS ABS: 0.8 10*3/uL (ref 0.7–4.0)
LYMPHS PCT: 6 % — AB (ref 12–46)
MCH: 24.5 pg — ABNORMAL LOW (ref 26.0–34.0)
MCHC: 31.6 g/dL (ref 30.0–36.0)
MCV: 77.4 fL — ABNORMAL LOW (ref 78.0–100.0)
MONOS PCT: 8 % (ref 3–12)
Monocytes Absolute: 0.9 10*3/uL (ref 0.1–1.0)
NEUTROS PCT: 86 % — AB (ref 43–77)
Neutro Abs: 9.9 10*3/uL — ABNORMAL HIGH (ref 1.7–7.7)
Platelets: 410 10*3/uL — ABNORMAL HIGH (ref 150–400)
RBC: 4.25 MIL/uL (ref 4.22–5.81)
RDW: 16.3 % — ABNORMAL HIGH (ref 11.5–15.5)
WBC: 11.7 10*3/uL — ABNORMAL HIGH (ref 4.0–10.5)

## 2014-04-17 LAB — COMPREHENSIVE METABOLIC PANEL
ALT: 13 U/L (ref 0–53)
ANION GAP: 19 — AB (ref 5–15)
AST: 14 U/L (ref 0–37)
Albumin: 3.5 g/dL (ref 3.5–5.2)
Alkaline Phosphatase: 94 U/L (ref 39–117)
BUN: 25 mg/dL — AB (ref 6–23)
CO2: 24 mEq/L (ref 19–32)
Calcium: 9.5 mg/dL (ref 8.4–10.5)
Chloride: 103 mEq/L (ref 96–112)
Creatinine, Ser: 0.53 mg/dL (ref 0.50–1.35)
GFR calc Af Amer: >90 mL/min (ref >90)
GFR calc non Af Amer: >90 mL/min (ref >90)
GLUCOSE: 136 mg/dL — AB (ref 70–99)
Potassium: 4.3 mEq/L (ref 3.7–5.3)
Sodium: 146 mEq/L (ref 137–147)
Total Protein: 8.9 g/dL — ABNORMAL HIGH (ref 6.0–8.3)

## 2014-04-17 LAB — URINE MICROSCOPIC-ADD ON

## 2014-04-17 LAB — URINALYSIS, ROUTINE W REFLEX MICROSCOPIC
BILIRUBIN URINE: NEGATIVE
Glucose, UA: NEGATIVE mg/dL
Hgb urine dipstick: NEGATIVE
KETONES UR: NEGATIVE mg/dL
Nitrite: NEGATIVE
Protein, ur: 100 mg/dL — AB
SPECIFIC GRAVITY, URINE: 1.019 (ref 1.005–1.030)
UROBILINOGEN UA: 0.2 mg/dL (ref 0.0–1.0)
pH: 8.5 — ABNORMAL HIGH (ref 5.0–8.0)

## 2014-04-17 LAB — I-STAT CG4 LACTIC ACID, ED: Lactic Acid, Venous: 0.94 mmol/L (ref 0.5–2.2)

## 2014-04-17 MED ORDER — METHOCARBAMOL 1000 MG/10ML IJ SOLN
1000.0000 mg | Freq: Three times a day (TID) | INTRAVENOUS | Status: DC
  Administered 2014-04-17 – 2014-04-22 (×16): 1000 mg via INTRAVENOUS
  Filled 2014-04-17 (×18): qty 10

## 2014-04-17 MED ORDER — LIDOCAINE HCL 1 % IJ SOLN
INTRAMUSCULAR | Status: AC
  Filled 2014-04-17: qty 20

## 2014-04-17 MED ORDER — FAMOTIDINE IN NACL 20-0.9 MG/50ML-% IV SOLN
20.0000 mg | Freq: Two times a day (BID) | INTRAVENOUS | Status: DC
  Administered 2014-04-17 – 2014-04-22 (×10): 20 mg via INTRAVENOUS
  Filled 2014-04-17 (×11): qty 50

## 2014-04-17 MED ORDER — SODIUM CHLORIDE 0.9 % IJ SOLN
10.0000 mL | INTRAMUSCULAR | Status: DC | PRN
  Administered 2014-04-18 – 2014-04-22 (×5): 10 mL
  Filled 2014-04-17 (×5): qty 40

## 2014-04-17 MED ORDER — PANTOPRAZOLE SODIUM 40 MG IV SOLR
40.0000 mg | INTRAVENOUS | Status: DC
  Administered 2014-04-17 – 2014-04-22 (×6): 40 mg via INTRAVENOUS
  Filled 2014-04-17 (×6): qty 40

## 2014-04-17 MED ORDER — ONDANSETRON HCL 4 MG PO TABS: 4.0000 mg | ORAL_TABLET | Freq: Four times a day (QID) | ORAL | Status: DC | PRN

## 2014-04-17 MED ORDER — POTASSIUM CHLORIDE IN NACL 20-0.9 MEQ/L-% IV SOLN
INTRAVENOUS | Status: DC
  Administered 2014-04-17 – 2014-04-21 (×7): via INTRAVENOUS
  Filled 2014-04-17 (×17): qty 1000

## 2014-04-17 MED ORDER — ONDANSETRON HCL 4 MG/2ML IJ SOLN: 4.0000 mg | Freq: Three times a day (TID) | INTRAMUSCULAR | Status: AC | PRN

## 2014-04-17 MED ORDER — SODIUM CHLORIDE 0.9 % IV BOLUS (SEPSIS): 500.0000 mL | Freq: Once | INTRAVENOUS | Status: DC

## 2014-04-17 MED ORDER — MORPHINE SULFATE 2 MG/ML IJ SOLN: 2.0000 mg | INTRAMUSCULAR | Status: DC | PRN

## 2014-04-17 MED ORDER — GI COCKTAIL ~~LOC~~
30.0000 mL | Freq: Once | ORAL | Status: AC
  Administered 2014-04-17: 30 mL via ORAL
  Filled 2014-04-17: qty 30

## 2014-04-17 MED ORDER — METOPROLOL TARTRATE 1 MG/ML IV SOLN
2.5000 mg | Freq: Four times a day (QID) | INTRAVENOUS | Status: DC
  Administered 2014-04-17 – 2014-04-22 (×21): 2.5 mg via INTRAVENOUS
  Filled 2014-04-17 (×25): qty 5

## 2014-04-17 MED ORDER — GI COCKTAIL ~~LOC~~
30.0000 mL | Freq: Three times a day (TID) | ORAL | Status: DC | PRN
  Filled 2014-04-17: qty 30

## 2014-04-17 MED ORDER — SODIUM CHLORIDE 0.9 % IV SOLN
INTRAVENOUS | Status: DC
  Administered 2014-04-17 – 2014-04-22 (×2): via INTRAVENOUS

## 2014-04-17 MED ORDER — IOHEXOL 300 MG/ML  SOLN
50.0000 mL | Freq: Once | INTRAMUSCULAR | Status: AC | PRN
  Administered 2014-04-17: 50 mL via ORAL

## 2014-04-17 MED ORDER — METOCLOPRAMIDE HCL 5 MG/ML IJ SOLN
10.0000 mg | Freq: Four times a day (QID) | INTRAMUSCULAR | Status: DC
  Administered 2014-04-17 – 2014-04-22 (×21): 10 mg via INTRAVENOUS
  Filled 2014-04-17 (×24): qty 2

## 2014-04-17 MED ORDER — ONDANSETRON HCL 4 MG/2ML IJ SOLN: 4.0000 mg | Freq: Once | INTRAMUSCULAR | Status: DC

## 2014-04-17 MED ORDER — ENOXAPARIN SODIUM 60 MG/0.6ML ~~LOC~~ SOLN
50.0000 mg | SUBCUTANEOUS | Status: DC
  Administered 2014-04-17 – 2014-04-21 (×5): 50 mg via SUBCUTANEOUS
  Filled 2014-04-17 (×7): qty 0.6

## 2014-04-17 MED ORDER — ONDANSETRON HCL 4 MG/2ML IJ SOLN
4.0000 mg | Freq: Four times a day (QID) | INTRAMUSCULAR | Status: DC | PRN
  Administered 2014-04-17 – 2014-04-19 (×2): 4 mg via INTRAVENOUS
  Filled 2014-04-17 (×2): qty 2

## 2014-04-17 MED ORDER — ACETAMINOPHEN 325 MG PO TABS
650.0000 mg | ORAL_TABLET | Freq: Once | ORAL | Status: AC
  Administered 2014-04-17: 650 mg via ORAL
  Filled 2014-04-17: qty 2

## 2014-04-17 NOTE — ED Notes (Signed)
Delsa Sale RN attempted an ultrasound IV and lab draw without success. Will notify PA.

## 2014-04-17 NOTE — ED Notes (Signed)
Patient transported to CT 

## 2014-04-17 NOTE — ED Notes (Signed)
Bed: RE32 Expected date:  Expected time:  Means of arrival:  Comments: EMS 48 yo male quad/nausea and vomiting x 1-temp of 100.7

## 2014-04-17 NOTE — ED Notes (Signed)
Once again pt refused NG tube insertion.

## 2014-04-17 NOTE — ED Notes (Addendum)
Pt is a quadripalegic that lives at home. Pt states that he began having nausea and running a low grade fever last night. States that when he woke up his fever hit 100.78F. Pt had 659ml+ of emesis en route and was given 4 mg IM Zofran. Denied any diarrhea or evidence of blood in his emesis. Pt has several pressure ulcers on his sacrum and one on his shoulder.

## 2014-04-17 NOTE — ED Notes (Signed)
Pt transferred to IR 

## 2014-04-17 NOTE — ED Notes (Signed)
IV team RN called back stating we need to call IR for PICC line for this pt.

## 2014-04-17 NOTE — ED Notes (Signed)
Patient transported to X-ray 

## 2014-04-17 NOTE — Progress Notes (Signed)
RT placed pt on CPAP at 19cmH2O per home settings via ffm. Sterile water was added for humidification. Pt states he is comfortable and is tolerating CPAP well at this time. RT will continue to monitor as needed.

## 2014-04-17 NOTE — ED Notes (Signed)
Tried to get blood but was unable to

## 2014-04-17 NOTE — Procedures (Signed)
Poor venous access and central line needed.  Placed a right IJ dual lumen catheter with [place] and fluoroscopy.  Tip at SVC/RA junction and catheter is ready to use.   No immediate complication.  See dictated report.

## 2014-04-17 NOTE — ED Notes (Signed)
Spoke with IR regarding PICC line insertion, they stated they have pt on their waiting list but are unsure how long it will be before they can do it. Explained that pt has no other access, they voiced understanding, however stated it might be "longer than few hours". Will notify PA

## 2014-04-17 NOTE — ED Notes (Signed)
Attempted PIV start and blood draw, no success. Another RN to attempt ultrasound IV start.
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
## 2014-04-17 NOTE — ED Notes (Signed)
Attempted to insert NG tube per order, however pt sts he feels "pretty good" at this time, denies nausea and wants to "hold off with tube until very last moment". Pt sts he will let me know when nausea returns but at this time he wants to refuse NG tube. Explained to pt that according to his x-rays he needs NG tube for severe abdominal distension, he voiced understanding and stated "I know I will have to have just not right now please"  PA Courtney notified and agreed it's okay for pt to wait and let us know when he is ready

## 2014-04-17 NOTE — ED Notes (Signed)
Admitting MD, Dr Wynelle Cleveland requests that pt stays in the ED until PICC line is placed

## 2014-04-17 NOTE — ED Notes (Signed)
Called second time to give report to fifth floor, once again they stated they will call back when ready.

## 2014-04-17 NOTE — ED Notes (Signed)
Attempted to call report to floor, per secretary they will call back in 15 minutes.

## 2014-04-17 NOTE — ED Notes (Signed)
Dr Ralene Bathe at bedside to attempt ultrasound IV

## 2014-04-17 NOTE — ED Notes (Signed)
Pt has wound vac to pressure ulcer on his right shoulder and stage IV ulcer to sacrum with what appears to be wet to dry packing and gauze dressing.

## 2014-04-17 NOTE — ED Notes (Signed)
Pt finished drinking oral contrast, CT notified.  

## 2014-04-17 NOTE — ED Notes (Signed)
Bedside report received from previous RN, Kara Dies

## 2014-04-17 NOTE — ED Notes (Signed)
Admitting MD at bedside.

## 2014-04-17 NOTE — ED Notes (Signed)
Dr Ralene Bathe unable to start ultrasound PIV.
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
## 2014-04-18 ENCOUNTER — Inpatient Hospital Stay (HOSPITAL_COMMUNITY): Payer: Medicare Other

## 2014-04-18 DIAGNOSIS — G8250 Quadriplegia, unspecified: Secondary | ICD-10-CM

## 2014-04-18 LAB — URINE CULTURE: Special Requests: NORMAL

## 2014-04-18 LAB — CBC
HEMATOCRIT: 28.2 % — AB (ref 39.0–52.0)
Hemoglobin: 8.5 g/dL — ABNORMAL LOW (ref 13.0–17.0)
MCH: 23.9 pg — ABNORMAL LOW (ref 26.0–34.0)
MCHC: 30.1 g/dL (ref 30.0–36.0)
MCV: 79.2 fL (ref 78.0–100.0)
PLATELETS: 399 10*3/uL (ref 150–400)
RBC: 3.56 MIL/uL — ABNORMAL LOW (ref 4.22–5.81)
RDW: 16.6 % — AB (ref 11.5–15.5)
WBC: 14.9 10*3/uL — AB (ref 4.0–10.5)

## 2014-04-18 LAB — BASIC METABOLIC PANEL
ANION GAP: 12 (ref 5–15)
BUN: 25 mg/dL — ABNORMAL HIGH (ref 6–23)
CO2: 24 meq/L (ref 19–32)
Calcium: 8.7 mg/dL (ref 8.4–10.5)
Chloride: 102 mEq/L (ref 96–112)
Creatinine, Ser: 0.56 mg/dL (ref 0.50–1.35)
GFR calc Af Amer: >90 mL/min (ref >90)
Glucose, Bld: 100 mg/dL — ABNORMAL HIGH (ref 70–99)
Potassium: 4.1 mEq/L (ref 3.7–5.3)
SODIUM: 138 meq/L (ref 137–147)

## 2014-04-18 MED ORDER — BOOST / RESOURCE BREEZE PO LIQD
1.0000 | Freq: Two times a day (BID) | ORAL | Status: DC
  Administered 2014-04-18 – 2014-04-19 (×2): 1 via ORAL
  Administered 2014-04-20: 11:00:00 via ORAL
  Administered 2014-04-20 – 2014-04-21 (×2): 1 via ORAL

## 2014-04-18 MED ORDER — POLYETHYLENE GLYCOL 3350 17 G PO PACK
17.0000 g | PACK | Freq: Two times a day (BID) | ORAL | Status: DC
  Administered 2014-04-18 – 2014-04-19 (×3): 17 g via ORAL
  Filled 2014-04-18 (×4): qty 1

## 2014-04-18 MED ORDER — ACETAMINOPHEN 325 MG PO TABS
650.0000 mg | ORAL_TABLET | Freq: Once | ORAL | Status: AC
  Administered 2014-04-19: 650 mg via ORAL

## 2014-04-18 MED ORDER — CETYLPYRIDINIUM CHLORIDE 0.05 % MT LIQD
7.0000 mL | Freq: Two times a day (BID) | OROMUCOSAL | Status: DC
  Administered 2014-04-18 – 2014-04-22 (×9): 7 mL via OROMUCOSAL

## 2014-04-18 MED ORDER — PRO-STAT SUGAR FREE PO LIQD
30.0000 mL | Freq: Three times a day (TID) | ORAL | Status: DC
  Administered 2014-04-19 (×2): 30 mL via ORAL
  Filled 2014-04-18 (×11): qty 30

## 2014-04-18 MED ORDER — ACETAMINOPHEN 325 MG PO TABS
ORAL_TABLET | ORAL | Status: AC
  Filled 2014-04-18: qty 2

## 2014-04-18 NOTE — Progress Notes (Signed)
04/18/14 Edwyna Shell RN BSN CM [phone] Patient stated that he lives at home with hired 24/7 caregivers and [facility] visits 3x/week for wound care and on the other days the caregivers perform wound care. The patient stated that he plans on returning home with current resources upon discharge. Contacted [facility] liaison, Cyril Mourning, and she confirmed that the patient is active with [facility] RN 3x/week. Will continue to follow.
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
## 2014-04-18 NOTE — Progress Notes (Signed)
RT placed patient on CPAP. Patients home setting is 19 cmH2O. Sterile water added to water chamber for humidification. Patient is tolerating well at this time. RT will continue to monitor as needed,
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
## 2014-04-18 NOTE — Consult Note (Addendum)
WOC wound consult note Reason for Consult: Consult requested for right shoulder and sacrum/ischium wounds.  Pt is familiar to [facility] team from multiple previous admissions.  In August, he had ACell applied to both wounds by Dr [name] of the plastics team according to EMR.  He is followed at the outpatient wound care center and is due for an appointment there in 2 weeks.  He is very well-informed regarding his wound care plan. He states that he has been using negative pressure to right shoulder prior to admission but his home machine lost its charge in the ER and his caretaker took it home. He is using collagen to the sacrum wound prior to admission; this topical treatment is not available in the [facility].  Plan to substitute Aquacel for dressing changes to sacrum and he can resume previous plan of care after discharge. Wound type: Right shoulder chronic stage 3: Sacrum/Ischium chronic stage 4 Pressure Ulcer POA: Yes Measurement: Sacrum/Ischium difficult to measure R/T multiple open areas which are connected.  Approx 30X20X5 cm, depth is from the deepest section.  Bone visible, wound bed 90% red, 10% yellow, strong odor consistent with psudomonias and large amt red drainage.  No active bleeding noted.  Right posterior shoulder 2X3X1cm with undermining to 1 cm on wound edges. 90% red, 10% yellow, small amt tan drainage, no odor. Applied one piece black foam to 11mm cont suction.  Pt tolerated without C/O pain.  Bridged trackpad to right anterior chest to reduce pressure to site. Dressing change will be due Monday. Dressing procedure/placement/frequency: Refer to plan above.  Pt can have moist gauze dressing applied when ready to discharge home and Vac can be resumed by the home health nurse. He can follow-up with the outpatient wound care center after discharge.Pt on low-airloss bed to reduce pressure. Pt has a colostomy pouch which is intact with good seal; supplies ordered to the room for bedside  nurse or patient use. Please re-consult if further assistance is needed.  Thank-you,  Julien Girt MSN, [place], [place], [place], [place]

## 2014-04-19 LAB — CBC
HCT: 29.1 % — ABNORMAL LOW (ref 39.0–52.0)
HEMOGLOBIN: 8.7 g/dL — AB (ref 13.0–17.0)
MCH: 24 pg — AB (ref 26.0–34.0)
MCHC: 29.9 g/dL — ABNORMAL LOW (ref 30.0–36.0)
MCV: 80.4 fL (ref 78.0–100.0)
Platelets: 415 10*3/uL — ABNORMAL HIGH (ref 150–400)
RBC: 3.62 MIL/uL — ABNORMAL LOW (ref 4.22–5.81)
RDW: 16.7 % — ABNORMAL HIGH (ref 11.5–15.5)
WBC: 11.4 10*3/uL — ABNORMAL HIGH (ref 4.0–10.5)

## 2014-04-19 MED ORDER — POLYETHYLENE GLYCOL 3350 17 G PO PACK
17.0000 g | PACK | Freq: Three times a day (TID) | ORAL | Status: DC
  Filled 2014-04-19 (×5): qty 1
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
## 2014-04-20 LAB — BASIC METABOLIC PANEL
Anion gap: 15 (ref 5–15)
BUN: 10 mg/dL (ref 6–23)
CHLORIDE: 106 meq/L (ref 96–112)
CO2: 20 mEq/L (ref 19–32)
Calcium: 9 mg/dL (ref 8.4–10.5)
Creatinine, Ser: 0.43 mg/dL — ABNORMAL LOW (ref 0.50–1.35)
GFR calc Af Amer: >90 mL/min (ref >90)
GFR calc non Af Amer: >90 mL/min (ref >90)
GLUCOSE: 89 mg/dL (ref 70–99)
Potassium: 4.1 mEq/L (ref 3.7–5.3)
Sodium: 141 mEq/L (ref 137–147)

## 2014-04-20 MED ORDER — DOCUSATE SODIUM 100 MG PO CAPS
200.0000 mg | ORAL_CAPSULE | Freq: Every day | ORAL | Status: DC
  Administered 2014-04-20 – 2014-04-21 (×2): 200 mg via ORAL
  Filled 2014-04-20 (×3): qty 2

## 2014-04-20 MED ORDER — POLYETHYLENE GLYCOL 3350 17 G PO PACK
17.0000 g | PACK | Freq: Two times a day (BID) | ORAL | Status: DC | PRN
  Filled 2014-04-20: qty 1

## 2014-04-20 NOTE — Progress Notes (Signed)
Spoke with pt regarding cpap.  Pt requested that I come back after 2330 to assist with cpap.  This RT will be back at that time.

## 2014-04-20 NOTE — Progress Notes (Signed)
UR Completed.  Takya Vandivier Jane [phone] 04/20/2014  
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
## 2014-04-21 MED ORDER — POLYETHYLENE GLYCOL 3350 17 G PO PACK
17.0000 g | PACK | Freq: Every day | ORAL | Status: DC | PRN
  Filled 2014-04-21: qty 1

## 2014-04-21 MED ORDER — ADULT MULTIVITAMIN W/MINERALS CH
1.0000 | ORAL_TABLET | Freq: Every day | ORAL | Status: DC
  Administered 2014-04-21 – 2014-04-22 (×2): 1 via ORAL
  Filled 2014-04-21 (×2): qty 1

## 2014-04-21 MED ORDER — JUVEN PO PACK
1.0000 | PACK | Freq: Three times a day (TID) | ORAL | Status: DC
  Administered 2014-04-21 – 2014-04-22 (×4): 1 via ORAL
  Filled 2014-04-21 (×5): qty 1

## 2014-04-21 MED ORDER — POLYETHYLENE GLYCOL 3350 17 G PO PACK
17.0000 g | PACK | Freq: Every day | ORAL | Status: DC
Start: 2014-04-21 — End: 2014-04-22
  Administered 2014-04-21 – 2014-04-22 (×2): 17 g via ORAL
  Filled 2014-04-21 (×2): qty 1

## 2014-04-21 MED ORDER — BOOST PLUS PO LIQD
237.0000 mL | Freq: Two times a day (BID) | ORAL | Status: DC
  Administered 2014-04-21 – 2014-04-22 (×3): 237 mL via ORAL
  Filled 2014-04-21 (×3): qty 237
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
## 2014-04-21 NOTE — Progress Notes (Signed)
Patient ID: Bartholomew Ramesh, male   DOB: Nov 17, 1965, 48 y.o.   MRN: [id number] [facility] Progress Note  Jex Strausbaugh 48 y.o. 1966/01/11   Subjective: Feels a little better. States belching at baseline. Denies vomiting or abdominal pain. On Pepcid. Tolerating clears. +Flatus. Denies any BMs in colostomy since Saturday night.   Objective: Vital signs in last 24 hours: Filed Vitals:   04/21/14 0625  BP: 138/72  Pulse: 85  Temp: 98.2 F (36.8 C)  Resp: 20    Physical Exam: Gen: alert, no acute distress Abd: distended, decreased bowel sounds, colostomy present  Lab Results:  Recent Labs  04/20/14 0350  NA 141  K 4.1  CL 106  CO2 20  GLUCOSE 89  BUN 10  CREATININE 0.43*  CALCIUM 9.0   No results for input(s): AST, ALT, ALKPHOS, BILITOT, PROT, ALBUMIN in the last 72 hours.  Recent Labs  04/19/14 0813  WBC 11.4*  HGB 8.7*  HCT 29.1*  MCV 80.4  PLT 415*   No results for input(s): LABPROT, INR in the last 72 hours.    Assessment/Plan: Gastric distention - continue supportive care; Continue Reglan; If vomiting develops, then will need an NG tube. Would not advance beyond liquid diet at this time. Will follow.   [place] C. 04/21/2014, 10:01 AM

## 2014-04-21 NOTE — Consult Note (Signed)
WOC wound follow up Wound type: right shoulder wound Measurement: 2cm x 3cm x 1cm with undermining at 12 o'clock   Wound bed: mostly clean, 10% yellow in base. Oozing with dressing change  Drainage (amount, consistency, odor) minimal, serosanguineous  Periwound: intact, new area of MARSI from [facility] drape under the patients axilla. Protected with foam dressing. Dressing procedure/placement/frequency: Wound filled with black foam, bridged to the right upper shoulder. Pt tolerated well. Seal at 169mmHG. Bedside nursing to change sacral dressings.    Discussed POC with patient and bedside nurse.  Re consult if needed, will not follow at this time. Thanks  Kortlynn Poust [place], [place] [phone])

## 2014-04-22 LAB — BASIC METABOLIC PANEL
Anion gap: 10 (ref 5–15)
BUN: 6 mg/dL (ref 6–23)
CO2: 22 meq/L (ref 19–32)
CREATININE: 0.45 mg/dL — AB (ref 0.50–1.35)
Calcium: 8.8 mg/dL (ref 8.4–10.5)
Chloride: 106 mEq/L (ref 96–112)
GFR calc Af Amer: >90 mL/min (ref >90)
Glucose, Bld: 93 mg/dL (ref 70–99)
Potassium: 3.4 mEq/L — ABNORMAL LOW (ref 3.7–5.3)
SODIUM: 138 meq/L (ref 137–147)

## 2014-04-22 LAB — CBC
HCT: 25.8 % — ABNORMAL LOW (ref 39.0–52.0)
HEMOGLOBIN: 7.9 g/dL — AB (ref 13.0–17.0)
MCH: 24.2 pg — AB (ref 26.0–34.0)
MCHC: 30.6 g/dL (ref 30.0–36.0)
MCV: 78.9 fL (ref 78.0–100.0)
Platelets: 386 10*3/uL (ref 150–400)
RBC: 3.27 MIL/uL — AB (ref 4.22–5.81)
RDW: 16.1 % — ABNORMAL HIGH (ref 11.5–15.5)
WBC: 8 10*3/uL (ref 4.0–10.5)

## 2014-04-22 MED ORDER — POLYETHYLENE GLYCOL 3350 17 G PO PACK: 17.0000 g | PACK | Freq: Every day | ORAL | Status: DC

## 2014-04-22 MED ORDER — FUROSEMIDE 20 MG PO TABS: 20.0000 mg | ORAL_TABLET | Freq: Every day | ORAL | Status: DC | PRN

## 2014-04-22 MED ORDER — POTASSIUM CHLORIDE CRYS ER 20 MEQ PO TBCR
40.0000 meq | EXTENDED_RELEASE_TABLET | Freq: Once | ORAL | Status: AC
  Administered 2014-04-22: 40 meq via ORAL
  Filled 2014-04-22: qty 2

## 2014-04-22 MED ORDER — POTASSIUM CHLORIDE CRYS ER 20 MEQ PO TBCR: 40.0000 meq | EXTENDED_RELEASE_TABLET | Freq: Every day | ORAL | Status: DC | PRN

## 2014-04-22 MED ORDER — POLYETHYLENE GLYCOL 3350 17 G PO PACK: 17.0000 g | PACK | Freq: Every day | ORAL | Status: DC | PRN

## 2014-04-22 NOTE — Progress Notes (Signed)
Patient ID: [name] [name], male   DOB: 1965/05/26, 48 y.o.   MRN: [id number] [facility]enter[facility] Progress Note  [name] [name] 48 y.o. 10-22-1965   Subjective: 3 BMs in ostomy overnight. Feels better. No abdominal pain. Tolerating liquid diet. Wants food.  Objective: Vital signs in last 24 hours: Filed Vitals:   04/22/14 0516  BP: 166/90  Pulse: 70  Temp: 98.2 F (36.8 C)  Resp: 22    Physical Exam: Gen: alert, no acute distress Abd: much softer, nontender, loose stool in ostomy  Lab Results:  Recent Labs  04/20/14 0350 04/22/14 0507  NA 141 138  K 4.1 3.4*  CL 106 106  CO2 20 22  GLUCOSE 89 93  BUN 10 6  CREATININE 0.43* 0.45*  CALCIUM 9.0 8.8   No results for input(s): AST, ALT, ALKPHOS, BILITOT, PROT, ALBUMIN in the last 72 hours.  Recent Labs  04/22/14 0507  WBC 8.0  HGB 7.9*  HCT 25.8*  MCV 78.9  PLT 386   No results for input(s): LABPROT, INR in the last 72 hours.    Assessment/Plan: Resolving Ileus - advance diet; supportive care. Will sign off. Call if questions.   [place] C. 04/22/2014, 9:11 AM

## 2014-04-22 NOTE — Progress Notes (Signed)
Clinical Social Work Department BRIEF PSYCHOSOCIAL ASSESSMENT 04/22/2014  Patient:  [name] [name],[name] [name]     Account Number:  [id number]     Admit date:  04/17/2014  Clinical Social Worker:  Earlie Server  Date/Time:  04/22/2014 02:15 PM  Referred by:  Physician  Date Referred:  04/22/2014 Referred for  Transportation assistance   Other Referral:   Interview type:  Patient Other interview type:    PSYCHOSOCIAL DATA Living Status:  FAMILY Admitted from facility:   Level of care:   Primary support name:  [name] Primary support relationship to patient:  FRIEND Degree of support available:   Adequate    CURRENT CONCERNS Current Concerns  Other - See comment   Other Concerns:   Transportation    SOCIAL WORK ASSESSMENT / PLAN CSW received referral in order to assist with transportation home. CSW reviewed chart and spoke with bedside RN who reports patient should be ready to DC after dinner tonight.    CSW met with patient at bedside. Patient reports that he lives at home with friends and family and plans to return today. Patient reports that he has keys to get into the house and is ready to DC when MD allows. Patient reports that [facility] takes him home and verified address. CSW completed [facility] forms and placed in chart. RN aware to call [phone] opt. 3 to arrange transportation when patient is ready.    CSW is signing off but available if needed.   Assessment/plan status:  No Further Intervention Required Other assessment/ plan:   Information/referral to community resources:   [facility] information    PATIENT'S/FAMILY'S RESPONSE TO PLAN OF CARE: Patient alert and oriented. Patient thanked CSW for assistance and agreeable to [facility] despite no guarantee of payment. Patient reports that his friends and family are very supportive and will be home to assist him at DC. Patient thanked CSW for assistance but reports no further needs.      [place], [place] [phone]

## 2014-04-22 NOTE — Progress Notes (Signed)
Verbally discussed d/c instructions and meds with patient. He verbally stated he had no questions. He tolerated his dinner without any nausea and pain. Ambulance has been called to pick up patient to take him home.
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
## 2014-04-22 NOTE — Plan of Care (Signed)
Problem: Discharge Progression Outcomes Goal: Discharge plan in place and appropriate Outcome: Completed/Met Date Met:  04/22/14 Goal: Pain controlled with appropriate interventions Outcome: Completed/Met Date Met:  04/22/14 Goal: Hemodynamically stable Outcome: Completed/Met Date Met:  12/82/08 Goal: Complications resolved/controlled Outcome: Completed/Met Date Met:  04/22/14 Goal: Tolerating diet Outcome: Completed/Met Date Met:  04/22/14 Goal: Activity appropriate for discharge plan Outcome: Completed/Met Date Met:  04/22/14 Goal: Other Discharge Outcomes/Goals Outcome: Completed/Met Date Met:  04/22/14

## 2014-04-22 NOTE — Progress Notes (Signed)
CARE MANAGEMENT NOTE 04/22/2014  Patient:  [name] [name],[name] [name]   Account Number:  [id number]  Date Initiated:  04/18/2014  Documentation initiated by:  Edwyna Shell  Subjective/Objective Assessment:   48 yo quadrapalegic male admitted with ileus and hx of gastroparesis from home with caregiver and [facility] RN, and has all necessary DME in home     Action/Plan:   discharge planning   Anticipated DC Date:  04/23/2014   Anticipated DC Plan:  [place]  CM consult      Choice offered to / List presented to:             Status of service:  In process, will continue to follow Medicare Important Message given?  YES (If response is "NO", the following Medicare IM given date fields will be blank) Date Medicare IM given:  04/22/2014 Medicare IM given by:  Edwyna Shell Date Additional Medicare IM given:   Additional Medicare IM given by:    Discharge Disposition:  [place]  Per UR Regulation:    If discussed at Long Length of Stay Meetings, dates discussed:   04/22/2014    Comments:  04/22/14 Edwyna Shell RN BSN CM [phone] Patient stated that he did come in with his wound vac but it is now at his house. He stated that in the past when he is discharged from the hospital the RN applies a temporary dressing and then once he is at home [facility] RN follows up and applies wound vac. Will continue to follow  04/18/14 Edwyna Shell RN BSN CM [phone] Patient stated that he lives at home with caregiver and [facility] RN 3x/week for dressing changes. He has a hospital bed with APP, hoyer lift, wound vac, wc in the home and has no other DME needs. Confirmed with [facility] that patient is active with [facility] RN services. Will continue to follow

## 2014-04-23 LAB — CULTURE, BLOOD (ROUTINE X 2): Culture: NO GROWTH

## 2014-04-24 LAB — CULTURE, BLOOD (ROUTINE X 2): Culture: NO GROWTH

## 2014-04-25 IMAGING — CR DG CHEST 2V
4 series · 4 of 4 positions shown · non-contrast
Comparison: Multiple, most recent 07/18/2011

CLINICAL DATA: Shortness of breath, cough

CHEST - 2 VIEW

[w chest lat (1 of 2)]
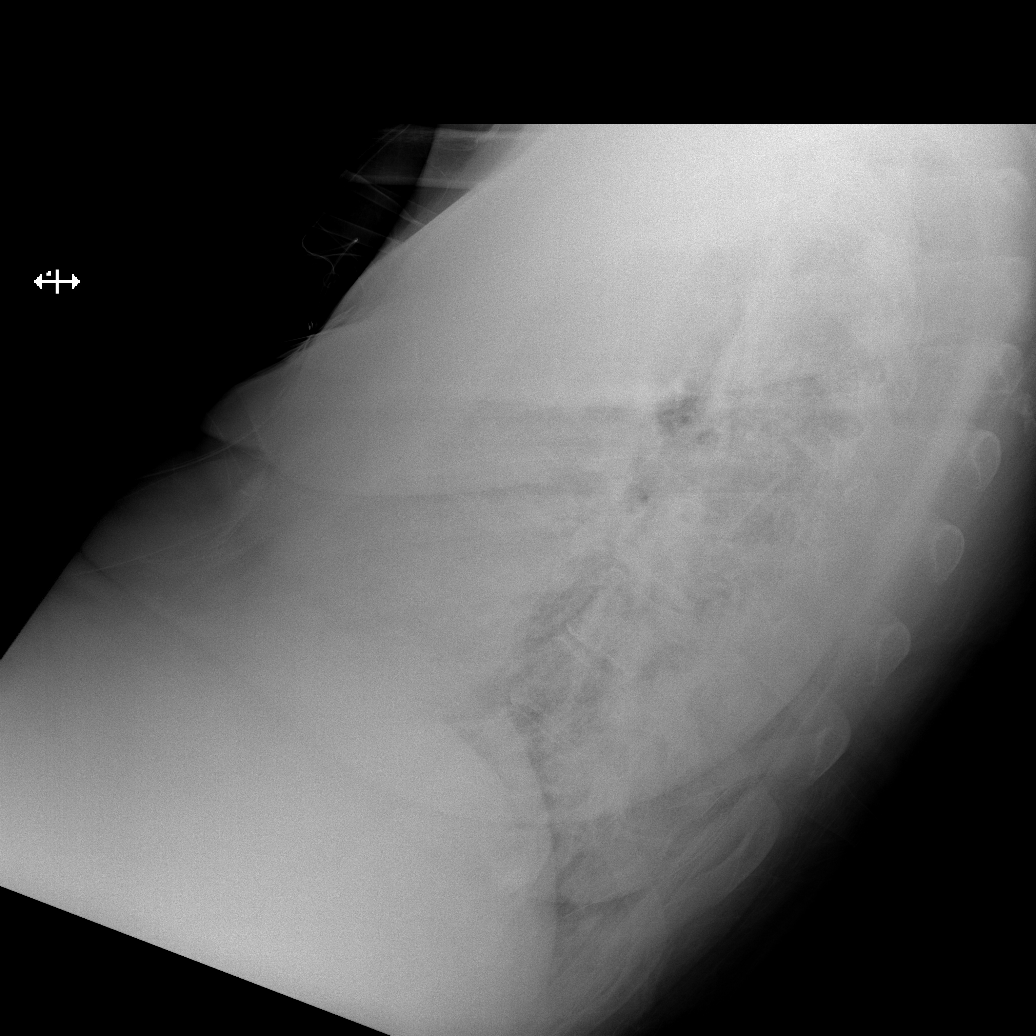

[w chest lat (2 of 2)]
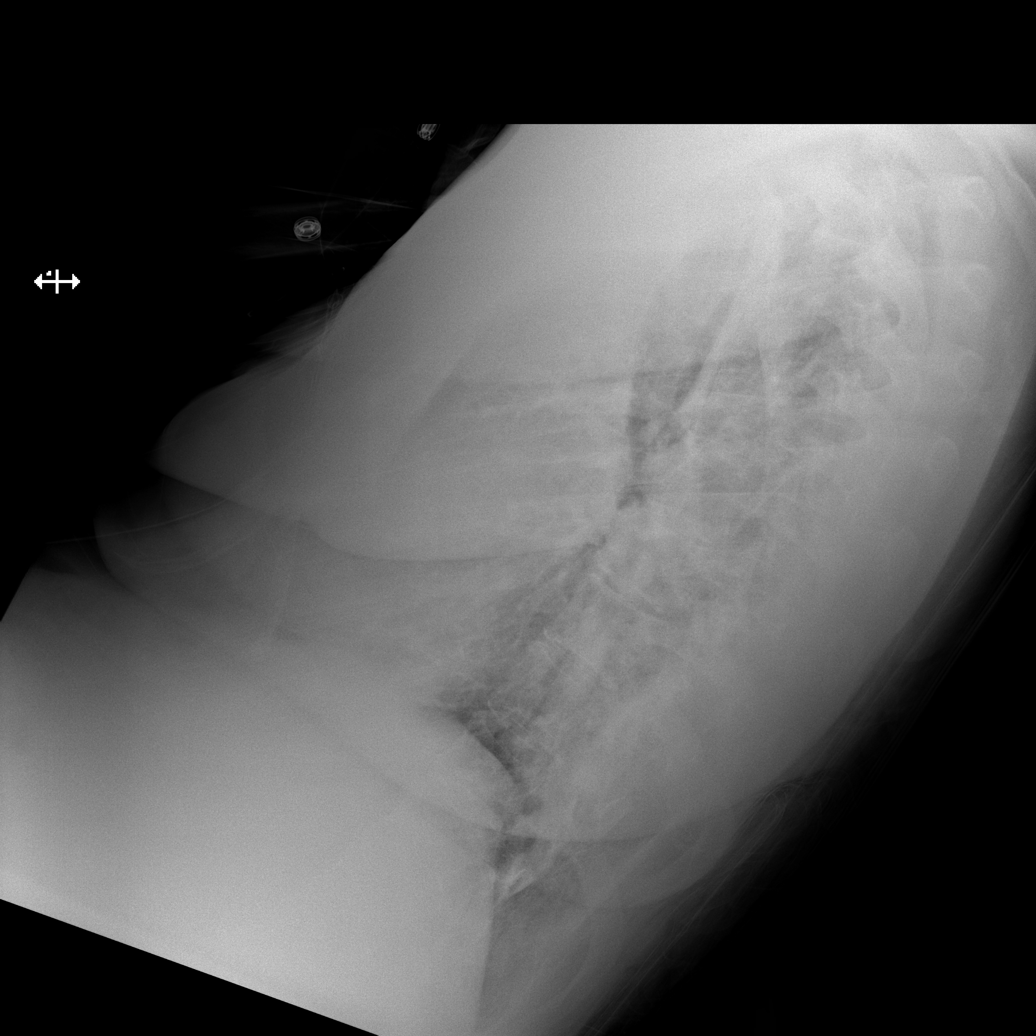

[x chest ap (1 of 2)]
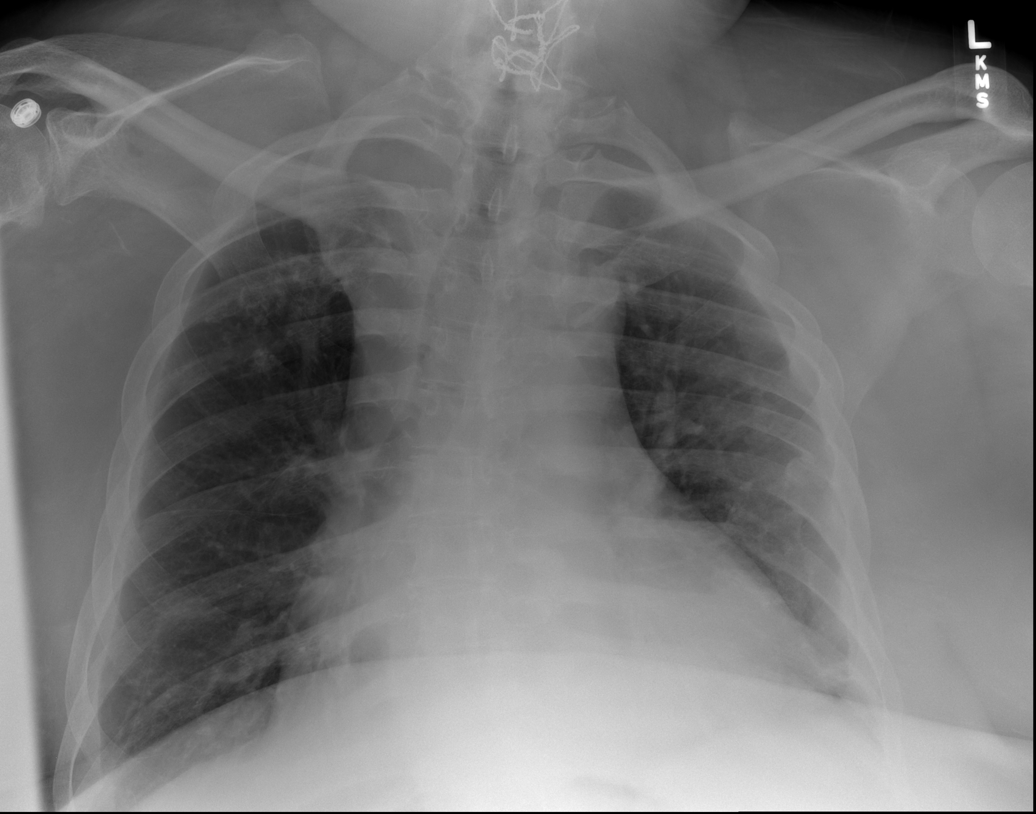

[x chest ap (2 of 2)]
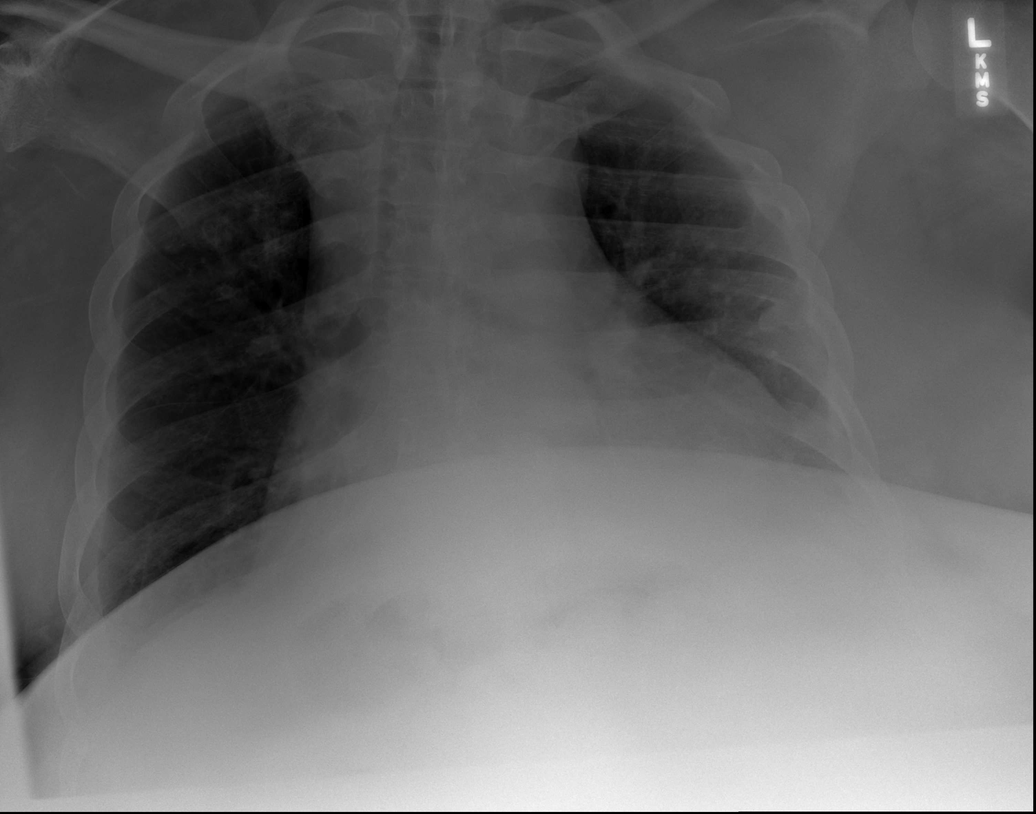

[4 of 4 positions shown; findings below may reference images not displayed]

FINDINGS: Prominent cardiomediastinal contours, similar to prior.
Nodular suprahilar opacities are similar to priors.  Due to
limitations in patient positioning, evaluation of the lung
bases/retrocardiac space is limited and a consolidation is not
excluded.  The appearance is similar to the comparison exam.
Sequelae of prior rib fractures again noted on the left.  Right
apical pleural thickening is similar to prior.  No pneumothorax
identified.  Partially imaged cervical fusion hardware.
IMPRESSION: Prominent cardiomediastinal contours, similar to prior.

Possible retrocardiac process such as atelectasis or pneumonia.

Evaluation is degraded by positioning and patient body habitus.

## 2014-04-30 ENCOUNTER — Encounter: Payer: Self-pay | Admitting: Infectious Diseases

## 2014-04-30 ENCOUNTER — Ambulatory Visit (INDEPENDENT_AMBULATORY_CARE_PROVIDER_SITE_OTHER): Payer: Medicare Other | Admitting: Infectious Diseases

## 2014-04-30 VITALS — BP 129/83 | HR 99 | Temp 99.0°F

## 2014-04-30 DIAGNOSIS — L89154 Pressure ulcer of sacral region, stage 4: Secondary | ICD-10-CM
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
## 2014-04-30 NOTE — Assessment & Plan Note (Signed)
I reviewed photos sent by his home health agency. They are low resolution but do not show any thing abnormal except for his chronic wounds. Will await his f/u visit with Dr [name]. Will see him back in 6-8 weeks

## 2014-05-05 ENCOUNTER — Encounter (HOSPITAL_BASED_OUTPATIENT_CLINIC_OR_DEPARTMENT_OTHER): Payer: Medicare Other | Attending: Plastic Surgery

## 2014-05-05 ENCOUNTER — Encounter: Payer: Self-pay | Admitting: Infectious Disease

## 2014-05-05 DIAGNOSIS — L89154 Pressure ulcer of sacral region, stage 4: Secondary | ICD-10-CM | POA: Diagnosis present

## 2014-05-10 ENCOUNTER — Encounter (HOSPITAL_COMMUNITY): Payer: Self-pay | Admitting: Emergency Medicine

## 2014-05-10 ENCOUNTER — Emergency Department (HOSPITAL_COMMUNITY)
Admission: EM | Admit: 2014-05-10 | Discharge: 2014-05-10 | Disposition: A | Discharge status: Home / Self Care | Payer: Medicare Other | Attending: Emergency Medicine | Admitting: Emergency Medicine

## 2014-05-10 ENCOUNTER — Emergency Department (HOSPITAL_COMMUNITY): Payer: Medicare Other

## 2014-05-10 DIAGNOSIS — Z79899 Other long term (current) drug therapy: Secondary | ICD-10-CM | POA: Diagnosis not present

## 2014-05-10 DIAGNOSIS — Z872 Personal history of diseases of the skin and subcutaneous tissue: Secondary | ICD-10-CM | POA: Insufficient documentation

## 2014-05-10 DIAGNOSIS — T8351XA Infection and inflammatory reaction due to indwelling urinary catheter, initial encounter: Secondary | ICD-10-CM | POA: Insufficient documentation

## 2014-05-10 DIAGNOSIS — Z8739 Personal history of other diseases of the musculoskeletal system and connective tissue: Secondary | ICD-10-CM | POA: Insufficient documentation

## 2014-05-10 DIAGNOSIS — I10 Essential (primary) hypertension: Secondary | ICD-10-CM | POA: Diagnosis not present

## 2014-05-10 DIAGNOSIS — R110 Nausea: Secondary | ICD-10-CM

## 2014-05-10 DIAGNOSIS — T83511A Infection and inflammatory reaction due to indwelling urethral catheter, initial encounter: Secondary | ICD-10-CM

## 2014-05-10 DIAGNOSIS — J45909 Unspecified asthma, uncomplicated: Secondary | ICD-10-CM | POA: Insufficient documentation

## 2014-05-10 DIAGNOSIS — Z8781 Personal history of (healed) traumatic fracture: Secondary | ICD-10-CM | POA: Diagnosis not present

## 2014-05-10 DIAGNOSIS — N390 Urinary tract infection, site not specified: Secondary | ICD-10-CM

## 2014-05-10 DIAGNOSIS — Z8619 Personal history of other infectious and parasitic diseases: Secondary | ICD-10-CM | POA: Insufficient documentation

## 2014-05-10 DIAGNOSIS — Y846 Urinary catheterization as the cause of abnormal reaction of the patient, or of later complication, without mention of misadventure at the time of the procedure: Secondary | ICD-10-CM | POA: Diagnosis not present

## 2014-05-10 DIAGNOSIS — D509 Iron deficiency anemia, unspecified: Secondary | ICD-10-CM | POA: Diagnosis not present

## 2014-05-10 DIAGNOSIS — Z8719 Personal history of other diseases of the digestive system: Secondary | ICD-10-CM | POA: Diagnosis not present

## 2014-05-10 DIAGNOSIS — Z8669 Personal history of other diseases of the nervous system and sense organs: Secondary | ICD-10-CM | POA: Insufficient documentation

## 2014-05-10 LAB — COMPREHENSIVE METABOLIC PANEL
ALK PHOS: 81 U/L (ref 39–117)
ALT: 12 U/L (ref 0–53)
AST: 15 U/L (ref 0–37)
Albumin: 3.2 g/dL — ABNORMAL LOW (ref 3.5–5.2)
Anion gap: 7 (ref 5–15)
BUN: 17 mg/dL (ref 6–23)
CALCIUM: 8.8 mg/dL (ref 8.4–10.5)
CO2: 25 mmol/L (ref 19–32)
Chloride: 107 mEq/L (ref 96–112)
Creatinine, Ser: 0.43 mg/dL — ABNORMAL LOW (ref 0.50–1.35)
GFR calc non Af Amer: >90 mL/min (ref >90)
GLUCOSE: 111 mg/dL — AB (ref 70–99)
POTASSIUM: 4.3 mmol/L (ref 3.5–5.1)
Sodium: 139 mmol/L (ref 135–145)
Total Bilirubin: 0.4 mg/dL (ref 0.3–1.2)
Total Protein: 8 g/dL (ref 6.0–8.3)

## 2014-05-10 LAB — URINALYSIS, ROUTINE W REFLEX MICROSCOPIC
BILIRUBIN URINE: NEGATIVE
Bilirubin Urine: NEGATIVE
GLUCOSE, UA: NEGATIVE mg/dL
Glucose, UA: NEGATIVE mg/dL
Ketones, ur: NEGATIVE mg/dL
Ketones, ur: NEGATIVE mg/dL
Nitrite: POSITIVE — AB
Nitrite: NEGATIVE
PH: 6 (ref 5.0–8.0)
Protein, ur: 30 mg/dL — AB
Protein, ur: 30 mg/dL — AB
SPECIFIC GRAVITY, URINE: 1.017 (ref 1.005–1.030)
Specific Gravity, Urine: 1.02 (ref 1.005–1.030)
UROBILINOGEN UA: 0.2 mg/dL (ref 0.0–1.0)
Urobilinogen, UA: 0.2 mg/dL (ref 0.0–1.0)
pH: 8 (ref 5.0–8.0)

## 2014-05-10 LAB — URINE MICROSCOPIC-ADD ON

## 2014-05-10 LAB — LIPASE, BLOOD: Lipase: 20 U/L (ref 11–59)

## 2014-05-10 LAB — CBC WITH DIFFERENTIAL/PLATELET
BASOS PCT: 0 % (ref 0–1)
Basophils Absolute: 0 10*3/uL (ref 0.0–0.1)
EOS ABS: 0.3 10*3/uL (ref 0.0–0.7)
EOS PCT: 2 % (ref 0–5)
HEMATOCRIT: 30.1 % — AB (ref 39.0–52.0)
Hemoglobin: 9.1 g/dL — ABNORMAL LOW (ref 13.0–17.0)
LYMPHS ABS: 1.7 10*3/uL (ref 0.7–4.0)
Lymphocytes Relative: 14 % (ref 12–46)
MCH: 23.9 pg — AB (ref 26.0–34.0)
MCHC: 30.2 g/dL (ref 30.0–36.0)
MCV: 79 fL (ref 78.0–100.0)
MONO ABS: 1.3 10*3/uL — AB (ref 0.1–1.0)
MONOS PCT: 11 % (ref 3–12)
Neutro Abs: 9 10*3/uL — ABNORMAL HIGH (ref 1.7–7.7)
Neutrophils Relative %: 73 % (ref 43–77)
Platelets: 361 10*3/uL (ref 150–400)
RBC: 3.81 MIL/uL — AB (ref 4.22–5.81)
RDW: 17 % — ABNORMAL HIGH (ref 11.5–15.5)
WBC: 12.4 10*3/uL — ABNORMAL HIGH (ref 4.0–10.5)

## 2014-05-10 MED ORDER — CIPROFLOXACIN HCL 500 MG PO TABS: 500.0000 mg | ORAL_TABLET | Freq: Two times a day (BID) | ORAL | Status: DC

## 2014-05-10 MED ORDER — ONDANSETRON 4 MG PO TBDP
4.0000 mg | ORAL_TABLET | Freq: Once | ORAL | Status: AC
  Administered 2014-05-10: 4 mg via ORAL
  Filled 2014-05-10: qty 1

## 2014-05-10 NOTE — ED Notes (Signed)
Bed: EU23 Expected date: 05/10/14 Expected time: 10:55 AM Means of arrival: Ambulance Comments: N/V ?UTI

## 2014-05-10 NOTE — ED Notes (Signed)
Unable to obtain blood draw, hospital phlebotomy paged.
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
## 2014-05-10 NOTE — Discharge Instructions (Signed)
Catheter-Associated Urinary Tract Infection FAQs °WHAT IS "CATHETER-ASSOCIATED" URINARY TRACT INFECTION? °A urinary tract infection (also called "UTI") is an infection in the urinary system, which includes the bladder (which stores the urine) and the kidneys (which filter the blood to make urine). Germs (for example, bacteria or yeasts) do not normally live in these areas; but if germs are introduced, an infection can occur. If you have a urinary catheter, germs can travel along the catheter and cause an infection in your bladder or your kidney; in that case it is called a catheter-associated urinary tract infection (or "CA-UTI").  °WHAT IS A URINARY CATHETER? °A urinary catheter is a thin tube placed in the bladder to drain urine. Urine drains through the tube into a bag that collects the urine. A urinary  °catheter may be used: °· If you are not able to urinate on your own. °· To measure the amount of urine that you make, for example, during intensive care. °· During and after some types of surgery. °· During some tests of the kidneys and bladder . °People with urinary catheters have a much higher chance of getting a urinary tract infection than people who don't have a catheter. °HOW DO I GET A CATHETER-ASSOCIATED URINARY TRACT INFECTION (CA-UTI)? °If germs enter the urinary tract, they may cause an infection. Many of the germs that cause a catheter-associated urinary tract infection are common germs found in your intestines that do not usually cause an infection there. Germs can enter the urinary tract when the catheter is being put in or while the catheter remains in the bladder.  °WHAT ARE THE SYMPTOMS OF A URINARY TRACT INFECTION?  °Some of the common symptoms of a urinary tract infection are: °· Burning or pain in the lower abdomen (that is, below the stomach). °· Fever. °· Bloody urine may be a sign of infection, but is also caused by other problems . °· Burning during urination or an increase in the  frequency of urination after the catheter is removed. °Sometimes people with catheter-associated urinary tract infections do not have these symptoms of infection. °CAN CATHETER-ASSOCIATED URINARY TRACT INFECTIONS BE TREATED? °Yes, most catheter-associated urinary tract infections can be treated with antibiotics and removal or change of the catheter. Your doctor will determine which antibiotic is best for you.  °WHAT ARE SOME OF THE THINGS THAT HOSPITALS ARE DOING TO PREVENT CATHETER-ASSOCIATED URINARY TRACT INFECTIONS? °To prevent urinary tract infections, doctors and nurses take the following actions.  °Catheter insertion °· Catheters are put in only when necessary and they are removed as soon as possible. °· Only properly trained persons insert catheters using sterile ("clean") technique. °· The skin in the area where the catheter will be inserted is cleaned before inserting the catheter. °· Other methods to drain the urine are sometimes used, such as: °¨ External catheters in men (these look like condoms and are placed over the penis rather than into the penis) °¨ Putting a temporary catheter in to drain the urine and removing it right away. This is called intermittent urethral catheterization. °Catheter care °· Healthcare providers clean their hands by washing them with soap and water or using an alcohol-based hand rub before and after touching your catheter. °¨ If you do not see your providers clean their hands, please ask them to do so. °· Avoid disconnecting the catheter and drain tube. This helps to prevent germs from getting into the catheter tube. °· The catheter is secured to the leg to prevent pulling on the   catheter. °· Avoid twisting or kinking the catheter. °· Keep the bag lower than the bladder to prevent urine from backflowing to the bladder. °· Empty the bag regularly. The drainage spout should not touch anything while emptying the bag. °WHAT CAN I DO TO HELP PREVENT CATHETER-ASSOCIATED URINARY  TRACT INFECTIONS IF I HAVE A CATHETER? °· Always clean your hands before and after doing catheter care. °· Always keep your urine bag below the level of your bladder. °· Do not tug or pull on the tubing. °· Do not twist or kink the catheter tubing. °· Ask your healthcare provider each day if you still need the catheter. °WHAT DO I NEED TO DO WHEN I GO HOME FROM THE HOSPITAL? °· If you will be going home with a catheter, your doctor or nurse should explain everything you need to know about taking care of the catheter. Make sure you understand how to care for it before you leave the hospital. °· If you develop any of the symptoms of a urinary tract infection, such as burning or pain in the lower abdomen, fever, or an increase in the frequency of urination, contact your doctor or nurse immediately. °· Before you go home, make sure you know who to contact if you have questions or problems after you get home. °If you have questions, please ask your doctor or nurse. °Developed and co-sponsored by The Society for Healthcare Epidemiology of America (SHEA); Infectious Diseases Society of America (IDSA); The American Hospital Association; Association for Professionals in Infection Control and Epidemiology (APIC); Center for Disease Control (CDC); and The Joint Commission °Document Released: 01/25/2012 Document Reviewed: 01/25/2012 °[employer]® Patient Information ©2015 [employer]. This information is not intended to replace advice given to you by your health care provider. Make sure you discuss any questions you have with your health care provider. ° °

## 2014-05-10 NOTE — ED Notes (Signed)
MAIN LAB TRYING TO DRAW BLOOD

## 2014-05-10 NOTE — ED Notes (Signed)
Pt here from home with c/o of nausea no vomiting. Sediment in urine, loss of appetite. Gastric discomfort, more with movement.

## 2014-05-12 LAB — URINE CULTURE: Colony Count: >=100000

## 2014-05-14 ENCOUNTER — Emergency Department (HOSPITAL_COMMUNITY)
Admission: EM | Admit: 2014-05-14 | Discharge: 2014-05-15 | Disposition: A | Discharge status: Home / Self Care | Payer: Medicare Other | Attending: Emergency Medicine | Admitting: Emergency Medicine

## 2014-05-14 ENCOUNTER — Encounter (HOSPITAL_COMMUNITY): Payer: Self-pay | Admitting: *Deleted

## 2014-05-14 DIAGNOSIS — T83511D Infection and inflammatory reaction due to indwelling urethral catheter, subsequent encounter: Secondary | ICD-10-CM

## 2014-05-14 DIAGNOSIS — Z435 Encounter for attention to cystostomy: Secondary | ICD-10-CM

## 2014-05-14 DIAGNOSIS — Z872 Personal history of diseases of the skin and subcutaneous tissue: Secondary | ICD-10-CM | POA: Insufficient documentation

## 2014-05-14 DIAGNOSIS — Z8619 Personal history of other infectious and parasitic diseases: Secondary | ICD-10-CM | POA: Diagnosis not present

## 2014-05-14 DIAGNOSIS — N390 Urinary tract infection, site not specified: Secondary | ICD-10-CM | POA: Diagnosis not present

## 2014-05-14 DIAGNOSIS — I10 Essential (primary) hypertension: Secondary | ICD-10-CM | POA: Diagnosis not present

## 2014-05-14 DIAGNOSIS — R32 Unspecified urinary incontinence: Secondary | ICD-10-CM | POA: Diagnosis present

## 2014-05-14 DIAGNOSIS — Z466 Encounter for fitting and adjustment of urinary device: Secondary | ICD-10-CM | POA: Diagnosis not present

## 2014-05-14 DIAGNOSIS — Z8719 Personal history of other diseases of the digestive system: Secondary | ICD-10-CM | POA: Insufficient documentation

## 2014-05-14 DIAGNOSIS — Z79899 Other long term (current) drug therapy: Secondary | ICD-10-CM | POA: Insufficient documentation

## 2014-05-14 DIAGNOSIS — Z862 Personal history of diseases of the blood and blood-forming organs and certain disorders involving the immune mechanism: Secondary | ICD-10-CM | POA: Insufficient documentation

## 2014-05-14 DIAGNOSIS — J45909 Unspecified asthma, uncomplicated: Secondary | ICD-10-CM | POA: Insufficient documentation

## 2014-05-14 DIAGNOSIS — Z8739 Personal history of other diseases of the musculoskeletal system and connective tissue: Secondary | ICD-10-CM | POA: Diagnosis not present

## 2014-05-14 DIAGNOSIS — G8250 Quadriplegia, unspecified: Secondary | ICD-10-CM | POA: Insufficient documentation

## 2014-05-14 DIAGNOSIS — G4733 Obstructive sleep apnea (adult) (pediatric): Secondary | ICD-10-CM | POA: Diagnosis not present

## 2014-05-14 DIAGNOSIS — Z791 Long term (current) use of non-steroidal anti-inflammatories (NSAID): Secondary | ICD-10-CM | POA: Diagnosis not present
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
## 2014-05-14 NOTE — ED Notes (Signed)
Per [facility] - pt from home - pt and family state upon turning pt, pt's foley noted to have become displaced. Pt is a quadriplegic, denies any pain at present - denies noting any trauma or blood at urethra.

## 2014-05-14 NOTE — ED Notes (Signed)
Bed: WA06 Expected date: 05/14/14 Expected time: 10:36 PM Means of arrival: Ambulance Comments: 48 yo M  Foley cath displaced

## 2014-05-15 LAB — I-STAT CHEM 8, ED
BUN: 16 mg/dL (ref 6–23)
CHLORIDE: 105 meq/L (ref 96–112)
CREATININE: 0.5 mg/dL (ref 0.50–1.35)
Calcium, Ion: 1.19 mmol/L (ref 1.12–1.23)
Glucose, Bld: 111 mg/dL — ABNORMAL HIGH (ref 70–99)
HCT: 29 % — ABNORMAL LOW (ref 39.0–52.0)
Hemoglobin: 9.9 g/dL — ABNORMAL LOW (ref 13.0–17.0)
POTASSIUM: 3.6 mmol/L (ref 3.5–5.1)
Sodium: 140 mmol/L (ref 135–145)
TCO2: 21 mmol/L (ref 0–100)

## 2014-05-15 LAB — URINALYSIS, ROUTINE W REFLEX MICROSCOPIC
Bilirubin Urine: NEGATIVE
GLUCOSE, UA: NEGATIVE mg/dL
HGB URINE DIPSTICK: NEGATIVE
Ketones, ur: NEGATIVE mg/dL
Nitrite: NEGATIVE
PH: 5.5 (ref 5.0–8.0)
Protein, ur: NEGATIVE mg/dL
Specific Gravity, Urine: 1.017 (ref 1.005–1.030)
UROBILINOGEN UA: 0.2 mg/dL (ref 0.0–1.0)

## 2014-05-15 LAB — URINE MICROSCOPIC-ADD ON

## 2014-05-15 NOTE — ED Notes (Signed)
Pt transported home via PT[facility] 

## 2014-05-15 NOTE — ED Notes (Signed)
Suprapubic catheter placed by Dr. Vanita Panda- assisted by this RN and Marissa, [facility] - no difficulty w/ insertion, immediate urine noted s/p insertion. No gross hematuria noted. 22french foley catheter placed a suprapubic ostomy.

## 2014-05-15 NOTE — Discharge Instructions (Signed)
Please call your doctor for a followup appointment within 24-48 hours. When you talk to your doctor please let them know that you were seen in the emergency department and have them acquire all of your records so that they can discuss the findings with you and formulate a treatment plan to fully care for your new and ongoing problems. Please call and set up an appointment with your primary care provider to be seen and reassessed Please check the skin around the catheter-if you start to see pus drainage, breakdown of the skin, irritation, itching, bleeding-please report back to the Emergency Department immediately Please closely monitor symptoms Since this is a 4 French catheter, may need the catheter to be replaced with an approximately 24-48 hours. Please closely monitor. High risk of suprapubic catheter falling out again.  Please continue to monitor symptoms closely and if symptoms are to worsen or change (fever greater than 101, chills, sweating, nausea, vomiting, chest pain, shortness of breathe, difficulty breathing, weakness, numbness, tingling, worsening or changes to pain pattern, abdominal pain, decreased urine coming out of the catheter, catheter falls out, blood in the catheter, colostomy bag malfunctions) please report back to the Emergency Department immediately.  Suprapubic Catheter Replacement Care After Refer to this sheet in the next few weeks. These instructions provide you with information on caring for yourself after changing your catheter. Your caregiver may also give you more specific instructions. Call your caregiver if you have any problems or questions after you change your catheter. HOME CARE INSTRUCTIONS   Take all medicines prescribed by your caregiver. Follow the directions carefully.  Drink 8 glasses of water every day. This produces good urine flow.  Check the skin around your catheter a few times every day. Watch for redness and swelling. Look for any fluids coming  out of the opening.  Do not use powder or cream around the catheter opening.  Do not take tub baths or use pools or hot tubs.  Keep all follow-up appointments. SEEK MEDICAL CARE IF:   You leak urine.  Your skin around the catheter becomes red or sore.  Your urine flow slows down.  Your urine gets cloudy or smelly. SEEK IMMEDIATE MEDICAL CARE IF:   You have chills, nausea, or back pain.  You have trouble changing your catheter.  Your catheter comes out.  You have blood in your urine.  You have no urine flow for 1 hour.  You have a fever. Document Released: 01/18/2011 Document Revised: 07/25/2011 Document Reviewed: 01/18/2011 [facility] [address] Patient Information 2015 [place], [place]. This information is not intended to replace advice given to you by your health care provider. Make sure you discuss any questions you have with your health care provider.

## 2014-05-18 LAB — URINE CULTURE
Colony Count: >=100000
SPECIAL REQUESTS: NORMAL

## 2014-05-19 ENCOUNTER — Telehealth (HOSPITAL_BASED_OUTPATIENT_CLINIC_OR_DEPARTMENT_OTHER): Payer: Self-pay | Admitting: Emergency Medicine

## 2014-05-19 NOTE — Telephone Encounter (Signed)
Post ED Visit - Positive Culture Follow-up  Culture report reviewed by antimicrobial stewardship pharmacist: []  Wes [place], Pharm.D., BCPS [x]  Heide Guile, Pharm.D., BCPS []  Alycia Rossetti, Pharm.D., BCPS []  [place], Pharm.D., BCPS, AAHIVP []  Legrand [place], Pharm.D., BCPS, AAHIVP []  Isac Sarna, Pharm.D., BCPS  Positive urine culture Enterococcus and providencia Treated with none, already on cipro and resistance noted, culture shows multiple species, no further patient follow-up is required at this time.  Hazle Nordmann 05/19/2014, 11:55 AM

## 2014-05-24 ENCOUNTER — Encounter (HOSPITAL_COMMUNITY): Payer: Self-pay | Admitting: Emergency Medicine

## 2014-05-24 ENCOUNTER — Emergency Department (HOSPITAL_COMMUNITY)
Admission: EM | Admit: 2014-05-24 | Discharge: 2014-05-24 | Disposition: A | Discharge status: Home / Self Care | Payer: Medicare Other | Attending: Emergency Medicine | Admitting: Emergency Medicine

## 2014-05-24 DIAGNOSIS — T83018A Breakdown (mechanical) of other indwelling urethral catheter, initial encounter: Secondary | ICD-10-CM | POA: Insufficient documentation

## 2014-05-24 DIAGNOSIS — I10 Essential (primary) hypertension: Secondary | ICD-10-CM | POA: Insufficient documentation

## 2014-05-24 DIAGNOSIS — T83010A Breakdown (mechanical) of cystostomy catheter, initial encounter: Secondary | ICD-10-CM

## 2014-05-24 DIAGNOSIS — Z872 Personal history of diseases of the skin and subcutaneous tissue: Secondary | ICD-10-CM | POA: Diagnosis not present

## 2014-05-24 DIAGNOSIS — Z8619 Personal history of other infectious and parasitic diseases: Secondary | ICD-10-CM | POA: Diagnosis not present

## 2014-05-24 DIAGNOSIS — J45909 Unspecified asthma, uncomplicated: Secondary | ICD-10-CM | POA: Insufficient documentation

## 2014-05-24 DIAGNOSIS — Z8781 Personal history of (healed) traumatic fracture: Secondary | ICD-10-CM | POA: Insufficient documentation

## 2014-05-24 DIAGNOSIS — D649 Anemia, unspecified: Secondary | ICD-10-CM | POA: Diagnosis not present

## 2014-05-24 DIAGNOSIS — Z8669 Personal history of other diseases of the nervous system and sense organs: Secondary | ICD-10-CM | POA: Diagnosis not present

## 2014-05-24 DIAGNOSIS — K259 Gastric ulcer, unspecified as acute or chronic, without hemorrhage or perforation: Secondary | ICD-10-CM | POA: Diagnosis not present

## 2014-05-24 DIAGNOSIS — Z79899 Other long term (current) drug therapy: Secondary | ICD-10-CM | POA: Diagnosis not present

## 2014-05-24 DIAGNOSIS — Y846 Urinary catheterization as the cause of abnormal reaction of the patient, or of later complication, without mention of misadventure at the time of the procedure: Secondary | ICD-10-CM | POA: Diagnosis not present

## 2014-05-24 DIAGNOSIS — Z8739 Personal history of other diseases of the musculoskeletal system and connective tissue: Secondary | ICD-10-CM | POA: Diagnosis not present

## 2014-05-24 DIAGNOSIS — Z87440 Personal history of urinary (tract) infections: Secondary | ICD-10-CM | POA: Insufficient documentation

## 2014-05-24 NOTE — ED Notes (Signed)
Bed: PN30 Expected date:  Expected time:  Means of arrival:  Comments: EMS 50yo M, pulled out F/C

## 2014-05-24 NOTE — ED Notes (Signed)
Pt. Is for discharge, [facility] called for transport.
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
## 2014-05-24 NOTE — ED Notes (Addendum)
Per EMS, pt. From home with complaint of supra pubic catheter pulled out by accident at around 1030 this evening, pt. Has quadriplegia, denied of any pain nor distress, alert and oriented x3. Stated that his supra pubic cath was also accidentally pulled out a week ago.

## 2014-05-24 NOTE — ED Notes (Signed)
Awaiting for PTAR.

## 2014-05-29 ENCOUNTER — Encounter (HOSPITAL_COMMUNITY): Payer: Self-pay | Admitting: Orthopedic Surgery

## 2014-06-09 ENCOUNTER — Encounter (HOSPITAL_BASED_OUTPATIENT_CLINIC_OR_DEPARTMENT_OTHER): Payer: Medicare Other | Attending: Plastic Surgery

## 2014-06-18 ENCOUNTER — Ambulatory Visit (INDEPENDENT_AMBULATORY_CARE_PROVIDER_SITE_OTHER): Payer: Medicare Other | Admitting: Infectious Diseases

## 2014-06-18 ENCOUNTER — Encounter: Payer: Self-pay | Admitting: Infectious Diseases

## 2014-06-18 VITALS — BP 97/66 | HR 111 | Temp 98.1°F

## 2014-06-18 DIAGNOSIS — L89112 Pressure ulcer of right upper back, stage 2: Secondary | ICD-10-CM

## 2014-06-18 DIAGNOSIS — E43 Unspecified severe protein-calorie malnutrition: Secondary | ICD-10-CM

## 2014-06-18 DIAGNOSIS — L89154 Pressure ulcer of sacral region, stage 4: Secondary | ICD-10-CM

## 2014-06-18 DIAGNOSIS — L89119 Pressure ulcer of right upper back, unspecified stage: Secondary | ICD-10-CM | POA: Insufficient documentation

## 2014-06-18 NOTE — Progress Notes (Signed)
Subjective:    Patient ID: [name] [name], male    DOB: 11/03/1965, 49 y.o.   MRN: [id number]  HPI 49 y.o. M with quadraplegia (C5 fracture 1991) and nonhealing sacral wound for the past 3-4 years. He has had multiple prolonged courses of antibiotic therapy. He was hospitalized on August 02, 2011 with right hip osteomyelitis. Was transferred to [facility] where a hip aspirate grew methicillin sensitive coagulase-negative staph and Candida. He received a long course of IV ertapenem and oral fluconazole. He was readmitted from April 19-25 and switched to vancomycin and ertapenem and received continued therapy. He was admitted again from June 25 to July 2. Bone biopsy grew Enterobacter and he was started on ertapenem again received 8 weeks of therapy completing that on September 1st. He was seen by Dr. Megan Salon and Dr. Baxter Flattery and they felt more likely admission complaints of fever, etc were related to pseudomonas UTI rather than his chronic pelvic infection.  He then returned 02-20-13 with fevers. He was found on CT to have an abscess 6.8 x 3.6 x 5.9 c with gas in hip joint, left side, with no changes in chronic right sacral decub and air tracking to right hip joint. He had urine and blood cultures drawn and placed on vancomycin and imipenem.  His UCx grew > 100k E cloacae (S-aminoglycosides, FLQ, Bactrim) for which he received fosfomycin.  His abscess was aspirated by IR on 02-22-13 and grew E coli (R flq and bactrim). He was treated with IV ceftriaxone and oral flagyl with a plan for 6 weeks of therapy (end Nov 19).  He was seen in ID clinic 04-24-13 with concerns about increasing wound d/c. He was sent to [facility], not restated on anbx.  Was seen in ID on 05-27-13, he was concerned about increasing wound d/c. He was kept off anbx. He continued to have f/u with [facility].  On 06-21-13 he was admitted to the hospital with fever. He was started on vanco/zosyn. He underwent MRI: 1.  There are chronic destructive osseous changes of bilateral hips with irregular soft tissue enhancement within bilateral hip joints and adjacent fluid collections. There is no significant marrow signal abnormality. The overall appearance is consistent with chronic osteomyelitis with the fluid collections concerning for abscesses. Overall there is no significant changes in the appearance of bilateral hips compared with the CT of the pelvis performed on 02/27/2013. 2. There is a sacral decubitus ulcer overlying the left SI joint with mild marrow edema of the posterior left sacrum and ilium with enhancement concerning for osteomyelitis. He underwent IR drain of his fluid collection, Cx was (-). He had UCx which grew Providencia, Pseudomonas. He was d/c home on 2-16. He was seen in ID clinic on 3-16 and was doing well. He had surgical f/u (not a surgical candidate) and was then transitioned to PO doxy (08-13-13).  He had ID f/u planned for late April 2015 but changed this to get second opinion at [facility]. He was continued on his doxy until admitted.  Comes to ED 5-12 worsening decubitus drainage. He was told this had increased particulate d/c. In ED was found to be hypotensive and WBC 11. His pelvic MRI was repeated and felt to be unchanged from his previous in October 2014. He was started on vanco/zosyn. Since admission has been noted to be more anemic.  He had Bcx 5-11 (ngtd), pyuria/bacturia on UA, UCx (Providencia), Wound C (staph aureus).  He was transitioned to ceftriaxone alone for  a short course (~ 1 week) then transitioned to PO bactrim. Was able to be d/c home on 09-30-13. At ID f/u he was contiued on PO bactrim, doing well.  On 12-21-13 he came to [facility] with increasing wound d/c and foul urine. He was found to have new abscess near R femur, ileum. He was taken to OR by Dr [name] and had debridement. His OR Cx grew A baumanii (S- colistin, tygacil) and Enterococcus (S- vanco, amp). His UCx grew P. Aeruginosa (S-  gent, imipenem, tobra). He also had wound on his R shoulder, prev wound vac.  He will complete 8 weeks of this 12-21-13.  He returned to hospital on 12-3 with abd pain and distention. He was treated conservatively. He was eval by [facility] for his chronic sacral wound as well as his R shoulder decubitus. His WBC went to 14 in the hospital and he was treated briefly with tygacil. His Cx were (-). He was afebrile. He was not d/c on anbx.   He had ID f/u 12-16 and was felt to be doing well.  Today he is without complaints. He has pictures of his wound on his tablet, taken by his home nurse. He has a deep-wound os on his L hip. Other wounds appear clean, there are few areas which appear to have adherent dark eschar. The drainage has been intermittent except his hip which has been increasing.  States that he is just getting collagen on his wounds, not santyl.  No fever or chills. Has plastics f/u on 06-23-14.    Review of Systems     Objective:   Physical Exam  Constitutional: He appears well-developed and well-nourished.  Musculoskeletal:       Arms:         Assessment & Plan:

## 2014-06-18 NOTE — Assessment & Plan Note (Addendum)
Unfortunately, not able to visualize his wounds on his sacrum with significant nursing support and a hoyer lift.  He has f/u with Dr [name] next week, if she feels that his wound is worsening, would be glad to restart anbx. For now, will try to continue the current strategy of limiting his anbx exposure.  He is getting key elements for his wound healing- off loading/air flow mattress, good home wound care, protein supplementation.  Will see him back in 3 months.

## 2014-06-18 NOTE — Assessment & Plan Note (Signed)
He is taking protein supplements. Will be key to his helaing.

## 2014-06-18 NOTE — Assessment & Plan Note (Signed)
This appears to be healing well. Appreciate home health wound care.

## 2014-06-30 ENCOUNTER — Encounter (HOSPITAL_BASED_OUTPATIENT_CLINIC_OR_DEPARTMENT_OTHER): Payer: Medicare Other | Attending: Internal Medicine

## 2014-07-15 ENCOUNTER — Emergency Department (HOSPITAL_COMMUNITY)
Admission: EM | Admit: 2014-07-15 | Discharge: 2014-07-15 | Disposition: A | Discharge status: Home / Self Care | Payer: Medicare Other | Source: Home / Self Care | Attending: Emergency Medicine | Admitting: Emergency Medicine

## 2014-07-15 ENCOUNTER — Emergency Department (HOSPITAL_COMMUNITY): Payer: Medicare Other

## 2014-07-15 ENCOUNTER — Encounter (HOSPITAL_COMMUNITY): Payer: Self-pay | Admitting: Emergency Medicine

## 2014-07-15 DIAGNOSIS — L89150 Pressure ulcer of sacral region, unstageable: Secondary | ICD-10-CM | POA: Insufficient documentation

## 2014-07-15 DIAGNOSIS — R509 Fever, unspecified: Secondary | ICD-10-CM

## 2014-07-15 DIAGNOSIS — Z87440 Personal history of urinary (tract) infections: Secondary | ICD-10-CM | POA: Insufficient documentation

## 2014-07-15 DIAGNOSIS — Z8781 Personal history of (healed) traumatic fracture: Secondary | ICD-10-CM | POA: Insufficient documentation

## 2014-07-15 DIAGNOSIS — L89300 Pressure ulcer of unspecified buttock, unstageable: Secondary | ICD-10-CM | POA: Insufficient documentation

## 2014-07-15 DIAGNOSIS — Z79899 Other long term (current) drug therapy: Secondary | ICD-10-CM | POA: Insufficient documentation

## 2014-07-15 DIAGNOSIS — Z8739 Personal history of other diseases of the musculoskeletal system and connective tissue: Secondary | ICD-10-CM | POA: Insufficient documentation

## 2014-07-15 DIAGNOSIS — R110 Nausea: Secondary | ICD-10-CM | POA: Diagnosis not present

## 2014-07-15 DIAGNOSIS — R5381 Other malaise: Secondary | ICD-10-CM

## 2014-07-15 DIAGNOSIS — G8250 Quadriplegia, unspecified: Secondary | ICD-10-CM | POA: Insufficient documentation

## 2014-07-15 DIAGNOSIS — L8990 Pressure ulcer of unspecified site, unspecified stage: Secondary | ICD-10-CM

## 2014-07-15 DIAGNOSIS — L89154 Pressure ulcer of sacral region, stage 4: Secondary | ICD-10-CM | POA: Diagnosis not present

## 2014-07-15 DIAGNOSIS — J45909 Unspecified asthma, uncomplicated: Secondary | ICD-10-CM | POA: Insufficient documentation

## 2014-07-15 DIAGNOSIS — Z8619 Personal history of other infectious and parasitic diseases: Secondary | ICD-10-CM | POA: Insufficient documentation

## 2014-07-15 DIAGNOSIS — D649 Anemia, unspecified: Secondary | ICD-10-CM | POA: Insufficient documentation

## 2014-07-15 DIAGNOSIS — I10 Essential (primary) hypertension: Secondary | ICD-10-CM | POA: Insufficient documentation

## 2014-07-15 LAB — URINALYSIS, ROUTINE W REFLEX MICROSCOPIC
Bilirubin Urine: NEGATIVE
GLUCOSE, UA: NEGATIVE mg/dL
Ketones, ur: NEGATIVE mg/dL
LEUKOCYTES UA: NEGATIVE
Nitrite: NEGATIVE
PROTEIN: NEGATIVE mg/dL
Specific Gravity, Urine: 1.001 — ABNORMAL LOW (ref 1.005–1.030)
Urobilinogen, UA: 0.2 mg/dL (ref 0.0–1.0)
pH: 7 (ref 5.0–8.0)

## 2014-07-15 LAB — CBC WITH DIFFERENTIAL/PLATELET
BASOS PCT: 0 % (ref 0–1)
Basophils Absolute: 0 10*3/uL (ref 0.0–0.1)
Eosinophils Absolute: 0.4 10*3/uL (ref 0.0–0.7)
Eosinophils Relative: 4 % (ref 0–5)
HCT: 28.4 % — ABNORMAL LOW (ref 39.0–52.0)
HEMOGLOBIN: 8.3 g/dL — AB (ref 13.0–17.0)
Lymphocytes Relative: 18 % (ref 12–46)
Lymphs Abs: 1.9 10*3/uL (ref 0.7–4.0)
MCH: 21.6 pg — AB (ref 26.0–34.0)
MCHC: 29.2 g/dL — AB (ref 30.0–36.0)
MCV: 74 fL — AB (ref 78.0–100.0)
Monocytes Absolute: 1.1 10*3/uL — ABNORMAL HIGH (ref 0.1–1.0)
Monocytes Relative: 10 % (ref 3–12)
Neutro Abs: 7.3 10*3/uL (ref 1.7–7.7)
Neutrophils Relative %: 68 % (ref 43–77)
Platelets: 519 10*3/uL — ABNORMAL HIGH (ref 150–400)
RBC: 3.84 MIL/uL — ABNORMAL LOW (ref 4.22–5.81)
RDW: 17.1 % — ABNORMAL HIGH (ref 11.5–15.5)
WBC: 10.8 10*3/uL — AB (ref 4.0–10.5)

## 2014-07-15 LAB — COMPREHENSIVE METABOLIC PANEL
ALK PHOS: 74 U/L (ref 39–117)
ALT: 14 U/L (ref 0–53)
ANION GAP: 4 — AB (ref 5–15)
AST: 21 U/L (ref 0–37)
Albumin: 2.9 g/dL — ABNORMAL LOW (ref 3.5–5.2)
BUN: 17 mg/dL (ref 6–23)
CALCIUM: 8.6 mg/dL (ref 8.4–10.5)
CO2: 26 mmol/L (ref 19–32)
Chloride: 105 mmol/L (ref 96–112)
Creatinine, Ser: 0.51 mg/dL (ref 0.50–1.35)
Glucose, Bld: 99 mg/dL (ref 70–99)
Potassium: 4.1 mmol/L (ref 3.5–5.1)
SODIUM: 135 mmol/L (ref 135–145)
Total Bilirubin: 0.2 mg/dL — ABNORMAL LOW (ref 0.3–1.2)
Total Protein: 8 g/dL (ref 6.0–8.3)

## 2014-07-15 LAB — URINE MICROSCOPIC-ADD ON

## 2014-07-15 MED ORDER — SULFAMETHOXAZOLE-TRIMETHOPRIM 800-160 MG PO TABS: 1.0000 | ORAL_TABLET | Freq: Two times a day (BID) | ORAL | Status: DC

## 2014-07-15 MED ORDER — GI COCKTAIL ~~LOC~~
30.0000 mL | Freq: Once | ORAL | Status: AC
  Administered 2014-07-15: 30 mL via ORAL
  Filled 2014-07-15: qty 30

## 2014-07-15 MED ORDER — SODIUM CHLORIDE 0.9 % IV BOLUS (SEPSIS)
1000.0000 mL | Freq: Once | INTRAVENOUS | Status: AC
  Administered 2014-07-15: 1000 mL via INTRAVENOUS

## 2014-07-15 MED ORDER — CEPHALEXIN 500 MG PO CAPS: 500.0000 mg | ORAL_CAPSULE | Freq: Four times a day (QID) | ORAL | Status: DC

## 2014-07-15 NOTE — ED Notes (Signed)
Per EMS pt complaint of "not feeling well" x1 day. Pt reports fever, increased thrist, and decreased appetite similar to past UTIs. Catheter in place for chronic use is cloudy.

## 2014-07-15 NOTE — ED Notes (Signed)
Patient transported to X-ray 

## 2014-07-15 NOTE — Discharge Instructions (Signed)
Cellulitis °Cellulitis is an infection of the skin and the tissue beneath it. The infected area is usually red and tender. Cellulitis occurs most often in the arms and lower legs.  °CAUSES  °Cellulitis is caused by bacteria that enter the skin through cracks or cuts in the skin. The most common types of bacteria that cause cellulitis are staphylococci and streptococci. °SIGNS AND SYMPTOMS  °· Redness and warmth. °· Swelling. °· Tenderness or pain. °· Fever. °DIAGNOSIS  °Your health care provider can usually determine what is wrong based on a physical exam. Blood tests may also be done. °TREATMENT  °Treatment usually involves taking an antibiotic medicine. °HOME CARE INSTRUCTIONS  °· Take your antibiotic medicine as directed by your health care provider. Finish the antibiotic even if you start to feel better. °· Keep the infected arm or leg elevated to reduce swelling. °· Apply a warm cloth to the affected area up to 4 times per day to relieve pain. °· Take medicines only as directed by your health care provider. °· Keep all follow-up visits as directed by your health care provider. °SEEK MEDICAL CARE IF:  °· You notice red streaks coming from the infected area. °· Your red area gets larger or turns dark in color. °· Your bone or joint underneath the infected area becomes painful after the skin has healed. °· Your infection returns in the same area or another area. °· You notice a swollen bump in the infected area. °· You develop new symptoms. °· You have a fever. °SEEK IMMEDIATE MEDICAL CARE IF:  °· You feel very sleepy. °· You develop vomiting or diarrhea. °· You have a general ill feeling (malaise) with muscle aches and pains. °MAKE SURE YOU:  °· Understand these instructions. °· Will watch your condition. °· Will get help right away if you are not doing well or get worse. °Document Released: 02/09/2005 Document Revised: 09/16/2013 Document Reviewed: 07/18/2011 °[employer]® Patient Information ©2015 [employer].  This information is not intended to replace advice given to you by your health care provider. Make sure you discuss any questions you have with your health care provider. ° °

## 2014-07-15 NOTE — ED Notes (Signed)
Bed: [facility] Expected date:  Expected time:  Means of arrival:  Comments: EMS-feeling sick
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
## 2014-07-16 ENCOUNTER — Encounter (HOSPITAL_COMMUNITY): Payer: Self-pay

## 2014-07-16 ENCOUNTER — Inpatient Hospital Stay (HOSPITAL_COMMUNITY)
Admission: EM | Admit: 2014-07-16 | Discharge: 2014-07-19 | DRG: 592 | Disposition: A | Discharge status: Home Health | Payer: Medicare Other | Attending: Internal Medicine | Admitting: Internal Medicine

## 2014-07-16 DIAGNOSIS — Z981 Arthrodesis status: Secondary | ICD-10-CM

## 2014-07-16 DIAGNOSIS — T798XXA Other early complications of trauma, initial encounter: Secondary | ICD-10-CM

## 2014-07-16 DIAGNOSIS — T148XXA Other injury of unspecified body region, initial encounter: Secondary | ICD-10-CM

## 2014-07-16 DIAGNOSIS — I10 Essential (primary) hypertension: Secondary | ICD-10-CM | POA: Diagnosis present

## 2014-07-16 DIAGNOSIS — L89154 Pressure ulcer of sacral region, stage 4: Principal | ICD-10-CM

## 2014-07-16 DIAGNOSIS — G8250 Quadriplegia, unspecified: Secondary | ICD-10-CM

## 2014-07-16 DIAGNOSIS — S14105S Unspecified injury at C5 level of cervical spinal cord, sequela: Secondary | ICD-10-CM

## 2014-07-16 DIAGNOSIS — B9620 Unspecified Escherichia coli [E. coli] as the cause of diseases classified elsewhere: Secondary | ICD-10-CM | POA: Diagnosis present

## 2014-07-16 DIAGNOSIS — Z9359 Other cystostomy status: Secondary | ICD-10-CM

## 2014-07-16 DIAGNOSIS — G4733 Obstructive sleep apnea (adult) (pediatric): Secondary | ICD-10-CM | POA: Diagnosis present

## 2014-07-16 DIAGNOSIS — K219 Gastro-esophageal reflux disease without esophagitis: Secondary | ICD-10-CM | POA: Diagnosis present

## 2014-07-16 DIAGNOSIS — N390 Urinary tract infection, site not specified: Secondary | ICD-10-CM

## 2014-07-16 DIAGNOSIS — Z79899 Other long term (current) drug therapy: Secondary | ICD-10-CM

## 2014-07-16 DIAGNOSIS — L089 Local infection of the skin and subcutaneous tissue, unspecified: Secondary | ICD-10-CM | POA: Diagnosis present

## 2014-07-16 DIAGNOSIS — Z9350 Unspecified cystostomy status: Secondary | ICD-10-CM

## 2014-07-16 DIAGNOSIS — Z6831 Body mass index (BMI) 31.0-31.9, adult: Secondary | ICD-10-CM

## 2014-07-16 DIAGNOSIS — Z87440 Personal history of urinary (tract) infections: Secondary | ICD-10-CM

## 2014-07-16 DIAGNOSIS — D638 Anemia in other chronic diseases classified elsewhere: Secondary | ICD-10-CM | POA: Diagnosis present

## 2014-07-16 DIAGNOSIS — Z933 Colostomy status: Secondary | ICD-10-CM

## 2014-07-16 DIAGNOSIS — B965 Pseudomonas (aeruginosa) (mallei) (pseudomallei) as the cause of diseases classified elsewhere: Secondary | ICD-10-CM | POA: Diagnosis present

## 2014-07-16 HISTORY — DX: Unspecified convulsions: R56.9

## 2014-07-16 HISTORY — DX: Pneumonia, unspecified organism: J18.9

## 2014-07-16 HISTORY — DX: Depression, unspecified: F32.A

## 2014-07-16 HISTORY — DX: Gastro-esophageal reflux disease without esophagitis: K21.9

## 2014-07-16 HISTORY — DX: Major depressive disorder, single episode, unspecified: F32.9

## 2014-07-16 HISTORY — DX: Dependence on other enabling machines and devices: Z99.89

## 2014-07-16 HISTORY — DX: Obstructive sleep apnea (adult) (pediatric): G47.33

## 2014-07-16 LAB — LIPASE, BLOOD: LIPASE: 27 U/L (ref 11–59)

## 2014-07-16 LAB — COMPREHENSIVE METABOLIC PANEL
ALT: 13 U/L (ref 0–53)
AST: 14 U/L (ref 0–37)
Albumin: 2.6 g/dL — ABNORMAL LOW (ref 3.5–5.2)
Alkaline Phosphatase: 66 U/L (ref 39–117)
Anion gap: 12 (ref 5–15)
BUN: 14 mg/dL (ref 6–23)
CALCIUM: 8.4 mg/dL (ref 8.4–10.5)
CO2: 24 mmol/L (ref 19–32)
CREATININE: 0.57 mg/dL (ref 0.50–1.35)
Chloride: 104 mmol/L (ref 96–112)
GFR calc Af Amer: >90 mL/min (ref >90)
GFR calc non Af Amer: >90 mL/min (ref >90)
GLUCOSE: 94 mg/dL (ref 70–99)
Potassium: 3.7 mmol/L (ref 3.5–5.1)
SODIUM: 140 mmol/L (ref 135–145)
Total Bilirubin: 0.5 mg/dL (ref 0.3–1.2)
Total Protein: 7.5 g/dL (ref 6.0–8.3)

## 2014-07-16 LAB — CBC WITH DIFFERENTIAL/PLATELET
BASOS PCT: 0 % (ref 0–1)
Basophils Absolute: 0 10*3/uL (ref 0.0–0.1)
Eosinophils Absolute: 0 10*3/uL (ref 0.0–0.7)
Eosinophils Relative: 0 % (ref 0–5)
HCT: 27.8 % — ABNORMAL LOW (ref 39.0–52.0)
HEMOGLOBIN: 8.4 g/dL — AB (ref 13.0–17.0)
LYMPHS PCT: 11 % — AB (ref 12–46)
Lymphs Abs: 1.4 10*3/uL (ref 0.7–4.0)
MCH: 21.9 pg — AB (ref 26.0–34.0)
MCHC: 30.2 g/dL (ref 30.0–36.0)
MCV: 72.6 fL — ABNORMAL LOW (ref 78.0–100.0)
MONO ABS: 1.2 10*3/uL — AB (ref 0.1–1.0)
Monocytes Relative: 10 % (ref 3–12)
NEUTROS PCT: 79 % — AB (ref 43–77)
Neutro Abs: 9.7 10*3/uL — ABNORMAL HIGH (ref 1.7–7.7)
PLATELETS: 477 10*3/uL — AB (ref 150–400)
RBC: 3.83 MIL/uL — AB (ref 4.22–5.81)
RDW: 17 % — ABNORMAL HIGH (ref 11.5–15.5)
WBC: 12.3 10*3/uL — ABNORMAL HIGH (ref 4.0–10.5)

## 2014-07-16 LAB — URINALYSIS, ROUTINE W REFLEX MICROSCOPIC
Bilirubin Urine: NEGATIVE
Glucose, UA: NEGATIVE mg/dL
Hgb urine dipstick: NEGATIVE
Ketones, ur: 15 mg/dL — AB
NITRITE: POSITIVE — AB
PH: 6 (ref 5.0–8.0)
Protein, ur: 100 mg/dL — AB
SPECIFIC GRAVITY, URINE: 1.025 (ref 1.005–1.030)
UROBILINOGEN UA: 0.2 mg/dL (ref 0.0–1.0)

## 2014-07-16 LAB — I-STAT CG4 LACTIC ACID, ED: Lactic Acid, Venous: 1.11 mmol/L (ref 0.5–2.0)

## 2014-07-16 LAB — URINE MICROSCOPIC-ADD ON

## 2014-07-16 MED ORDER — ACETAMINOPHEN 325 MG PO TABS: 650.0000 mg | ORAL_TABLET | Freq: Four times a day (QID) | ORAL | Status: DC | PRN

## 2014-07-16 MED ORDER — METOCLOPRAMIDE HCL 10 MG PO TABS
10.0000 mg | ORAL_TABLET | Freq: Three times a day (TID) | ORAL | Status: DC
  Administered 2014-07-16 – 2014-07-19 (×9): 10 mg via ORAL
  Filled 2014-07-16 (×8): qty 1

## 2014-07-16 MED ORDER — SODIUM CHLORIDE 0.9 % IV SOLN: 250.0000 mL | INTRAVENOUS | Status: DC | PRN

## 2014-07-16 MED ORDER — OXYCODONE HCL 5 MG PO TABS: 10.0000 mg | ORAL_TABLET | Freq: Four times a day (QID) | ORAL | Status: DC | PRN

## 2014-07-16 MED ORDER — BOOST HIGH PROTEIN PO LIQD
1.0000 | Freq: Three times a day (TID) | ORAL | Status: DC
  Administered 2014-07-16 – 2014-07-19 (×5): 237 mL via ORAL
  Filled 2014-07-16 (×3): qty 237

## 2014-07-16 MED ORDER — AMLODIPINE BESYLATE 2.5 MG PO TABS
2.5000 mg | ORAL_TABLET | Freq: Every morning | ORAL | Status: DC
  Administered 2014-07-17 – 2014-07-19 (×3): 2.5 mg via ORAL
  Filled 2014-07-16 (×3): qty 1

## 2014-07-16 MED ORDER — SODIUM CHLORIDE 0.9 % IV BOLUS (SEPSIS)
500.0000 mL | Freq: Once | INTRAVENOUS | Status: AC
  Administered 2014-07-16: 500 mL via INTRAVENOUS

## 2014-07-16 MED ORDER — FAMOTIDINE 20 MG PO TABS
20.0000 mg | ORAL_TABLET | Freq: Two times a day (BID) | ORAL | Status: DC
  Administered 2014-07-16 – 2014-07-19 (×6): 20 mg via ORAL
  Filled 2014-07-16 (×7): qty 1

## 2014-07-16 MED ORDER — CEFTRIAXONE SODIUM IN DEXTROSE 20 MG/ML IV SOLN
1.0000 g | INTRAVENOUS | Status: DC
  Administered 2014-07-17 – 2014-07-19 (×3): 1 g via INTRAVENOUS
  Filled 2014-07-16 (×3): qty 50

## 2014-07-16 MED ORDER — OXYCODONE HCL 10 MG PO TABS: 10.0000 mg | ORAL_TABLET | Freq: Four times a day (QID) | ORAL | Status: DC | PRN

## 2014-07-16 MED ORDER — CEFTRIAXONE SODIUM 1 G IJ SOLR
1.0000 g | Freq: Once | INTRAMUSCULAR | Status: AC
  Administered 2014-07-16: 1 g via INTRAVENOUS
  Filled 2014-07-16: qty 10

## 2014-07-16 MED ORDER — ONDANSETRON HCL 4 MG/2ML IJ SOLN: 4.0000 mg | Freq: Four times a day (QID) | INTRAMUSCULAR | Status: DC | PRN

## 2014-07-16 MED ORDER — POLYETHYLENE GLYCOL 3350 17 G PO PACK
17.0000 g | PACK | Freq: Every day | ORAL | Status: DC
  Administered 2014-07-16 – 2014-07-19 (×3): 17 g via ORAL
  Filled 2014-07-16 (×3): qty 1

## 2014-07-16 MED ORDER — ACETAMINOPHEN 650 MG RE SUPP: 650.0000 mg | Freq: Four times a day (QID) | RECTAL | Status: DC | PRN

## 2014-07-16 MED ORDER — SODIUM CHLORIDE 0.9 % IJ SOLN
3.0000 mL | Freq: Two times a day (BID) | INTRAMUSCULAR | Status: DC
  Administered 2014-07-18 – 2014-07-19 (×2): 3 mL via INTRAVENOUS

## 2014-07-16 MED ORDER — BACLOFEN 20 MG PO TABS
20.0000 mg | ORAL_TABLET | Freq: Four times a day (QID) | ORAL | Status: DC
  Administered 2014-07-16 – 2014-07-19 (×10): 20 mg via ORAL
  Filled 2014-07-16 (×10): qty 1
  Filled 2014-07-16: qty 2

## 2014-07-16 MED ORDER — DARIFENACIN HYDROBROMIDE ER 15 MG PO TB24
15.0000 mg | ORAL_TABLET | Freq: Every day | ORAL | Status: DC
  Administered 2014-07-16 – 2014-07-19 (×4): 15 mg via ORAL
  Filled 2014-07-16 (×4): qty 1

## 2014-07-16 MED ORDER — ONDANSETRON HCL 4 MG/2ML IJ SOLN
4.0000 mg | Freq: Once | INTRAMUSCULAR | Status: AC
  Administered 2014-07-16: 4 mg via INTRAVENOUS
  Filled 2014-07-16: qty 2

## 2014-07-16 MED ORDER — HEPARIN SODIUM (PORCINE) 5000 UNIT/ML IJ SOLN
5000.0000 [IU] | Freq: Three times a day (TID) | INTRAMUSCULAR | Status: DC
  Administered 2014-07-16 – 2014-07-19 (×7): 5000 [IU] via SUBCUTANEOUS
  Filled 2014-07-16 (×8): qty 1

## 2014-07-16 MED ORDER — SODIUM CHLORIDE 0.9 % IJ SOLN
3.0000 mL | INTRAMUSCULAR | Status: DC | PRN
  Administered 2014-07-19: 3 mL via INTRAVENOUS
  Filled 2014-07-16: qty 3

## 2014-07-16 MED ORDER — ONDANSETRON HCL 4 MG PO TABS: 4.0000 mg | ORAL_TABLET | Freq: Four times a day (QID) | ORAL | Status: DC | PRN

## 2014-07-16 NOTE — ED Notes (Addendum)
Phlebotomy at the bedside. Attempting to get Blood Cultures and Blood.

## 2014-07-16 NOTE — Progress Notes (Signed)
Placed patient on CPAP for the night.  Patient is tolerating well at this time. 

## 2014-07-16 NOTE — ED Notes (Signed)
Waiting for IV placement. Patient remains on the cardiac monitor. Patient made aware of the plan of care.
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
## 2014-07-16 NOTE — ED Notes (Signed)
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 Caregiver called EMS after patient vomited up black dark vomit this morning. Patient has not vomited or been nauseous with PTAR. Patient reports having yellow purulent drainage coming from wounds on his back. Patient was checked at [facility] yesterday for thirst. HE could not get enough to drink. Patient did an Xray of hip and chest with no results and blood work was drawn. Patient is Alert and Oriented x4 upon arrival. Vitals per PTAR: 118/70, 97% on RA, 20 RR, 92 HR.
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
## 2014-07-16 NOTE — ED Notes (Signed)
Unable to gain access of IV.

## 2014-07-17 DIAGNOSIS — Z6831 Body mass index (BMI) 31.0-31.9, adult: Secondary | ICD-10-CM | POA: Diagnosis not present

## 2014-07-17 DIAGNOSIS — Z981 Arthrodesis status: Secondary | ICD-10-CM | POA: Diagnosis not present

## 2014-07-17 DIAGNOSIS — L89154 Pressure ulcer of sacral region, stage 4: Secondary | ICD-10-CM | POA: Diagnosis present

## 2014-07-17 DIAGNOSIS — B965 Pseudomonas (aeruginosa) (mallei) (pseudomallei) as the cause of diseases classified elsewhere: Secondary | ICD-10-CM | POA: Diagnosis present

## 2014-07-17 DIAGNOSIS — N390 Urinary tract infection, site not specified: Secondary | ICD-10-CM | POA: Diagnosis present

## 2014-07-17 DIAGNOSIS — I10 Essential (primary) hypertension: Secondary | ICD-10-CM | POA: Diagnosis present

## 2014-07-17 DIAGNOSIS — Z933 Colostomy status: Secondary | ICD-10-CM | POA: Diagnosis not present

## 2014-07-17 DIAGNOSIS — G4733 Obstructive sleep apnea (adult) (pediatric): Secondary | ICD-10-CM | POA: Diagnosis present

## 2014-07-17 DIAGNOSIS — R110 Nausea: Secondary | ICD-10-CM | POA: Diagnosis present

## 2014-07-17 DIAGNOSIS — K219 Gastro-esophageal reflux disease without esophagitis: Secondary | ICD-10-CM | POA: Diagnosis present

## 2014-07-17 DIAGNOSIS — T798XXA Other early complications of trauma, initial encounter: Secondary | ICD-10-CM | POA: Diagnosis not present

## 2014-07-17 DIAGNOSIS — G8250 Quadriplegia, unspecified: Secondary | ICD-10-CM | POA: Diagnosis present

## 2014-07-17 DIAGNOSIS — Z79899 Other long term (current) drug therapy: Secondary | ICD-10-CM | POA: Diagnosis not present

## 2014-07-17 DIAGNOSIS — Z9350 Unspecified cystostomy status: Secondary | ICD-10-CM | POA: Diagnosis not present

## 2014-07-17 DIAGNOSIS — Z9359 Other cystostomy status: Secondary | ICD-10-CM | POA: Diagnosis not present

## 2014-07-17 DIAGNOSIS — B9620 Unspecified Escherichia coli [E. coli] as the cause of diseases classified elsewhere: Secondary | ICD-10-CM | POA: Diagnosis present

## 2014-07-17 DIAGNOSIS — Z87440 Personal history of urinary (tract) infections: Secondary | ICD-10-CM | POA: Diagnosis not present

## 2014-07-17 DIAGNOSIS — D638 Anemia in other chronic diseases classified elsewhere: Secondary | ICD-10-CM | POA: Diagnosis present

## 2014-07-17 DIAGNOSIS — S14105S Unspecified injury at C5 level of cervical spinal cord, sequela: Secondary | ICD-10-CM | POA: Diagnosis not present

## 2014-07-17 LAB — BASIC METABOLIC PANEL
Anion gap: 8 (ref 5–15)
BUN: 19 mg/dL (ref 6–23)
CHLORIDE: 103 mmol/L (ref 96–112)
CO2: 27 mmol/L (ref 19–32)
CREATININE: 0.68 mg/dL (ref 0.50–1.35)
Calcium: 8.4 mg/dL (ref 8.4–10.5)
GFR calc Af Amer: >90 mL/min (ref >90)
GFR calc non Af Amer: >90 mL/min (ref >90)
Glucose, Bld: 84 mg/dL (ref 70–99)
POTASSIUM: 3.8 mmol/L (ref 3.5–5.1)
Sodium: 138 mmol/L (ref 135–145)

## 2014-07-17 LAB — CBC
HEMATOCRIT: 26.1 % — AB (ref 39.0–52.0)
Hemoglobin: 7.7 g/dL — ABNORMAL LOW (ref 13.0–17.0)
MCH: 21.9 pg — ABNORMAL LOW (ref 26.0–34.0)
MCHC: 29.5 g/dL — AB (ref 30.0–36.0)
MCV: 74.1 fL — AB (ref 78.0–100.0)
Platelets: 487 10*3/uL — ABNORMAL HIGH (ref 150–400)
RBC: 3.52 MIL/uL — ABNORMAL LOW (ref 4.22–5.81)
RDW: 17.4 % — AB (ref 11.5–15.5)
WBC: 11.4 10*3/uL — ABNORMAL HIGH (ref 4.0–10.5)

## 2014-07-17 MED ORDER — COLLAGENASE 250 UNIT/GM EX OINT
TOPICAL_OINTMENT | Freq: Every day | CUTANEOUS | Status: DC
  Administered 2014-07-17 – 2014-07-19 (×3): via TOPICAL
  Filled 2014-07-17: qty 30

## 2014-07-17 MED ORDER — BOOST PLUS PO LIQD
237.0000 mL | Freq: Three times a day (TID) | ORAL | Status: DC
  Administered 2014-07-17 – 2014-07-19 (×6): 237 mL via ORAL
  Filled 2014-07-17 (×10): qty 237

## 2014-07-17 MED ORDER — JUVEN PO PACK
1.0000 | PACK | Freq: Two times a day (BID) | ORAL | Status: DC
  Administered 2014-07-17 – 2014-07-19 (×4): 1 via ORAL
  Filled 2014-07-17 (×5): qty 1
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
## 2014-07-17 NOTE — Progress Notes (Signed)
UR completed 

## 2014-07-17 NOTE — Progress Notes (Signed)
Chaplain responded to spiritual care consult for pt requesting prayer. Chaplain prayed for medical conditions and direction per pt request. Pt hopes to have some answers for how he is feeling during this admission. Page chaplain as needed.    07/17/14 1000  Clinical Encounter Type  Visited With Patient  Visit Type Initial;Spiritual support  Referral From Nurse  Spiritual Encounters  Spiritual Needs Emotional;Prayer  Stress Factors  Patient Stress Factors Health changes;Lack of knowledge  Marcelino Scot 07/17/2014 10:01 AM

## 2014-07-17 NOTE — Progress Notes (Signed)
Placed pt. On Cpap full fask mask setting of 15 CMH20.  Tolerating well no issues to report.

## 2014-07-17 NOTE — Consult Note (Signed)
WOC wound consult note Pt well known to the [facility] team from multiple previous admissions.  Pt with chronic Stage IV wounds over the sacrum and bilateral ischial areas. Extensive surgical intervention on this same area including flaps and grafts.  He is followed by plastic surgery at the [facility] wound care center/Dr. Sanger.  He has a [facility] and private duty care at home. Pt with chronic known osteomylitis. Reason for Consult: pressure ulcers  Wound type: Stage IV Pressure Ulcer sacrum: 4cm x 10 cm x 0.1cm- clean, pink, moist, 100% granulation tissue Stage IV Pressure ulcer right hip: 2.0cm x 2.0cm x 7cm deep, tunneling with macerated wound edges and pink base Stage IV Pressure Ulcer left distal buttock: 2.5cm x 6.0cm x 0.1cm-clean, pink moist Unstageable Pressure ulcer/combination MASD - perineal region and scrotum, mostly yellow/grey slough difficult to measure due to extension over this area and also anterior on the scrotum. Shoulder: did not assess this today during my assessment, last seen by this [facility] nurse 04/22/15 was Stage III so therefore this would still be Stage III but in a healing phase.  Pressure Ulcer POA: Yes Measurement:see above  Wound bed:see above  Drainage (amount, consistency, odor) heavy, yellow, serosanguinous. Some odor that is normal for this patient's drainage. New area on the scrotum and perineum may have increased odor due to necrotic tissue.  Minimal at the shoulder site  Periwound: large area of re-epithelialized skin over the sacrum, buttocks, and disfigured areas due to multiple surgeries.  Dressing procedure/placement/frequency: 1. Saline dressings for the clean, superficial areas of the sacrum and left buttock 2. Silver hydrofiber for the tunneling area on the right buttock/hip 3. Enzymatic debridement ointment for the scrotal and perineal areas to clear the necrotic tissue 4. Silicone foam to protect and insulate the right shoulder wound.  Low air loss mattress  for pressure redistribution ordered.  Pt has Prevalon boots at home and we will float heels during this admission.  Optimize protein for wound healing. FU with Dr. [name] and the wound care center at the time of DC.  [place] team will follow along with you for weekly wound assessments.  Please notify me of any acute changes in the wounds or any new areas of concerns Para March RN,CWOCN [phone]

## 2014-07-17 NOTE — Progress Notes (Signed)
[facility]  Patient Status: Active (receiving services up to time of hospitalization)  [facility] is providing the following services: RN  If patient discharges after hours, please call [phone].   [name] [name] 07/17/2014, 10:20 AM

## 2014-07-17 NOTE — Progress Notes (Signed)
PROGRESS NOTE  [name] [name] [id number] DOB: Dec 08, 1965 DOA: 07/16/2014 PCP: [name], MD  HPI/Recap of past 24 hours: Feeling better, no n/v, denies pain.  Assessment/Plan: Principal Problem:   Wound infection Active Problems:   Quadriplegia   Sacral decubitus ulcer, stage IV   Suprapubic catheter   UTI (lower urinary tract infection)  1. UTI. Urine culture with g-rod, on rocephin. 2. Sacral wounds with some extension to the scrotum. Concern for early infection. Lactic acid was normal. Hemodynamics are stable. 3. Vomiting, no recurrence at this point. No history of bleeding. Monitor clinically. 4. Suprapubic catheter, abnormal urinalysis, possible UTI but also consider colonization, f/u culture.  5. Chronic normocytic anemia appears to be at baseline.  6. GERD 7. Quadriplegia, total care 8. Morbid obesity  9. OSA   Code Status: full code DVT prophylaxis: Lovenox Family Communication: patient   Disposition Plan: home when culture result finalized, patient has 24/7 care at home   Consultants:  none  Procedures:  none  Antibiotics:  rocephin   Objective: BP 149/85 mmHg  Pulse 84  Temp(Src) 98.6 F (37 C) (Oral)  Resp 18  SpO2 100%  Intake/Output Summary (Last 24 hours) at 07/17/14 1715 Last data filed at 07/17/14 0913  Gross per 24 hour  Intake   1060 ml  Output   1400 ml  Net   -340 ml   There were no vitals filed for this visit.  Exam: 10. General: Examined in the emergency department. Appears calm and comfortable 11. Eyes: PERRL, normal lids, irises  12. ENT: grossly normal hearing, lips & tongue 13. Neck: no LAD, masses or thyromegaly 14. Cardiovascular: RRR, no m/r/g. No LE edema. 15. Respiratory: CTA bilaterally, no w/r/r. Normal respiratory effort. 16. Abdomen: soft, ntnd. +superpubic catheter, + colosotomy 17. Skin: Sacral decubiti noted. There is significant skin breakdown extending to the scrotum without definite  features of acute infection. 18. 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 19. Psychiatric: grossly normal mood and affect, speech fluent and appropriate 20. Neurologic: As above   Data Reviewed: Basic Metabolic Panel:  Recent Labs Lab 07/15/14 1954 07/16/14 1328 07/17/14 0430  NA 135 140 138  K 4.1 3.7 3.8  CL 105 104 103  CO2 26 24 27   GLUCOSE 99 94 84  BUN 17 14 19   CREATININE 0.51 0.57 0.68  CALCIUM 8.6 8.4 8.4   Liver Function Tests:  Recent Labs Lab 07/15/14 1954 07/16/14 1328  AST 21 14  ALT 14 13  ALKPHOS 74 66  BILITOT 0.2* 0.5  PROT 8.0 7.5  ALBUMIN 2.9* 2.6*    Recent Labs Lab 07/16/14 1328  LIPASE 27   No results for input(s): AMMONIA in the last 168 hours. CBC:  Recent Labs Lab 07/15/14 1954 07/16/14 1328 07/17/14 0430  WBC 10.8* 12.3* 11.4*  NEUTROABS 7.3 9.7*  --   HGB 8.3* 8.4* 7.7*  HCT 28.4* 27.8* 26.1*  MCV 74.0* 72.6* 74.1*  PLT 519* 477* 487*   Cardiac Enzymes:   No results for input(s): CKTOTAL, CKMB, CKMBINDEX, TROPONINI in the last 168 hours. BNP (last 3 results) No results for input(s): BNP in the last 8760 hours.  ProBNP (last 3 results)  Recent Labs  07/19/13 1938  PROBNP 79.5    CBG: No results for input(s): GLUCAP in the last 168 hours.  Recent Results (from the past 240 hour(s))  Urine culture     Status: None (Preliminary result)   Collection Time: 07/15/14  8:18 PM  Result Value Ref Range Status   Specimen Description URINE, RANDOM  Final   Special Requests NONE  Final   Colony Count   Final    >=100,000 COLONIES/ML Performed at [facility]    Culture   Final    [place] Performed at [facility]    Report Status PENDING  Incomplete  Culture, blood (routine x 2)     Status: None (Preliminary result)   Collection Time: 07/16/14  1:20 PM  Result Value Ref Range Status   Specimen Description BLOOD HAND LEFT  Final    Special Requests BOTTLES DRAWN AEROBIC AND ANAEROBIC 5CC  Final   Culture   Final           BLOOD CULTURE RECEIVED NO GROWTH TO DATE CULTURE WILL BE HELD FOR 5 DAYS BEFORE ISSUING A FINAL NEGATIVE REPORT Performed at [facility]    Report Status PENDING  Incomplete  Culture, blood (routine x 2)     Status: None (Preliminary result)   Collection Time: 07/16/14  1:45 PM  Result Value Ref Range Status   Specimen Description BLOOD HAND LEFT  Final   Special Requests BOTTLES DRAWN AEROBIC AND ANAEROBIC 5CC  Final   Culture   Final           BLOOD CULTURE RECEIVED NO GROWTH TO DATE CULTURE WILL BE HELD FOR 5 DAYS BEFORE ISSUING A FINAL NEGATIVE REPORT Performed at [facility]    Report Status PENDING  Incomplete     Studies: No results found.  Scheduled Meds: . amLODipine  2.5 mg Oral q morning - 10a  . baclofen  20 mg Oral QID  . cefTRIAXone (ROCEPHIN)  IV  1 g Intravenous Q24H  . collagenase   Topical Daily  . darifenacin  15 mg Oral Daily  . famotidine  20 mg Oral BID  . feeding supplement  1 Container Oral TID BM  . heparin  5,000 Units Subcutaneous 3 times per day  . lactose free nutrition  237 mL Oral TID WC  . metoCLOPramide  10 mg Oral TID AC  . nutrition supplement (JUVEN)  1 packet Oral BID BM  . polyethylene glycol  17 g Oral Daily  . sodium chloride  3 mL Intravenous Q12H    Continuous Infusions: none     Zanetta Dehaan  [facility] Pager [phone]. If 7PM-7AM, please contact night-coverage at [email or web address], password [facility] 07/17/2014, 5:15 PM  LOS: 1 day

## 2014-07-18 LAB — URINE CULTURE: Colony Count: >=100000

## 2014-07-18 LAB — BASIC METABOLIC PANEL
Anion gap: 11 (ref 5–15)
BUN: 14 mg/dL (ref 6–23)
CALCIUM: 8.8 mg/dL (ref 8.4–10.5)
CHLORIDE: 102 mmol/L (ref 96–112)
CO2: 24 mmol/L (ref 19–32)
CREATININE: 0.49 mg/dL — AB (ref 0.50–1.35)
GFR calc Af Amer: >90 mL/min (ref >90)
GFR calc non Af Amer: >90 mL/min (ref >90)
GLUCOSE: 98 mg/dL (ref 70–99)
Potassium: 3.9 mmol/L (ref 3.5–5.1)
Sodium: 137 mmol/L (ref 135–145)

## 2014-07-18 LAB — CBC
HCT: 28 % — ABNORMAL LOW (ref 39.0–52.0)
Hemoglobin: 8.1 g/dL — ABNORMAL LOW (ref 13.0–17.0)
MCH: 21.5 pg — ABNORMAL LOW (ref 26.0–34.0)
MCHC: 28.9 g/dL — AB (ref 30.0–36.0)
MCV: 74.5 fL — ABNORMAL LOW (ref 78.0–100.0)
Platelets: 523 10*3/uL — ABNORMAL HIGH (ref 150–400)
RBC: 3.76 MIL/uL — ABNORMAL LOW (ref 4.22–5.81)
RDW: 17.3 % — ABNORMAL HIGH (ref 11.5–15.5)
WBC: 9.6 10*3/uL (ref 4.0–10.5)

## 2014-07-18 NOTE — Care Management Note (Addendum)
    Page 1 of 1   07/20/2014     8:16:10 AM CARE MANAGEMENT NOTE 07/20/2014  Patient:  Offerdahl,Jaydee   Account Number:  [id number]  Date Initiated:  07/18/2014  Documentation initiated by:  Magdalen Spatz  Subjective/Objective Assessment:   adm: Nausea     Action/Plan:   discharge planning   Anticipated DC Date:  07/21/2014   Anticipated DC Plan:  [place]         [facility] [place] [facility]  Resumption Of Svcs/PTA Provider   Choice offered to / List presented to:          [facility] arranged  HH-1 RN      [place].   Status of service:   Medicare Important Message given?  YES (If response is "NO", the following Medicare IM given date fields will be blank) Date Medicare IM given:  07/18/2014 Medicare IM given by:  Magdalen Spatz Date Additional Medicare IM given:   Additional Medicare IM given by:    Discharge Disposition:  [place]  Per UR Regulation:  Reviewed for med. necessity/level of care/duration of stay  If discussed at [place] of Stay Meetings, dates discussed:    Comments:  07/19/14 10:00 CM requested resumption orders.  CM notified [facility] rep, Pura Spice of resumption orders and discharge today. No other CM needs were communicated.  Mariane Masters, BSN, [place] [phone].  07-18-14 Patient has had [place] in past . Magdalen Spatz RN BSN

## 2014-07-18 NOTE — Progress Notes (Signed)
PROGRESS NOTE  [name] [name] [id number] DOB: March 06, 1966 DOA: 07/16/2014 PCP: [name], MD  HPI/Recap of past 24 hours: Continue to Feel better, no n/v, denies pain.   Assessment/Plan: Principal Problem:   Wound infection Active Problems:   Quadriplegia   Sacral decubitus ulcer, stage IV   Suprapubic catheter   UTI (lower urinary tract infection)  1. UTI. Urine culture with g-rod, on rocephin. Discuss with patient, patient prefer to have urine culture resulted prior to discharge, patient is doing much better, likely responding to current therapy, likely will be discharged tomorrow with oral abx. 2. Sacral wounds with some extension to the scrotum. Concern for early infection. Lactic acid was normal. Hemodynamics are stable. 3. Vomiting, no recurrence at this point. No history of bleeding. Monitor clinically. 4. Suprapubic catheter, abnormal urinalysis, possible UTI but also consider colonization, f/u culture.  5. Chronic normocytic anemia appears to be at baseline.  6. GERD 7. Quadriplegia, total care 8. Morbid obesity  9. OSA   Code Status: full code DVT prophylaxis: Lovenox Family Communication: patient   Disposition Plan: home when culture result finalized, patient has 24/7 care at home   Consultants:  none  Procedures:  none  Antibiotics:  rocephin   Objective: BP 99/57 mmHg  Pulse 80  Temp(Src) 97.9 F (36.6 C) (Oral)  Resp 16  SpO2 100%  Intake/Output Summary (Last 24 hours) at 07/18/14 1948 Last data filed at 07/18/14 1844  Gross per 24 hour  Intake    840 ml  Output   1950 ml  Net  -1110 ml   There were no vitals filed for this visit.  Exam: 10. General: Examined in the emergency department. Appears calm and comfortable 11. Eyes: PERRL, normal lids, irises  12. ENT: grossly normal hearing, lips & tongue 13. Neck: no LAD, masses or thyromegaly 14. Cardiovascular: RRR, no m/r/g. No LE edema. 15. Respiratory: CTA bilaterally,  no w/r/r. Normal respiratory effort. 16. Abdomen: soft, ntnd. +superpubic catheter, + colosotomy 17. Skin: Sacral decubiti noted. There is significant skin breakdown extending to the scrotum without definite features of acute infection. 18. 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 19. Psychiatric: grossly normal mood and affect, speech fluent and appropriate 20. Neurologic: As above   Data Reviewed: Basic Metabolic Panel:  Recent Labs Lab 07/15/14 1954 07/16/14 1328 07/17/14 0430 07/18/14 0612  NA 135 140 138 137  K 4.1 3.7 3.8 3.9  CL 105 104 103 102  CO2 26 24 27 24   GLUCOSE 99 94 84 98  BUN 17 14 19 14   CREATININE 0.51 0.57 0.68 0.49*  CALCIUM 8.6 8.4 8.4 8.8   Liver Function Tests:  Recent Labs Lab 07/15/14 1954 07/16/14 1328  AST 21 14  ALT 14 13  ALKPHOS 74 66  BILITOT 0.2* 0.5  PROT 8.0 7.5  ALBUMIN 2.9* 2.6*    Recent Labs Lab 07/16/14 1328  LIPASE 27   No results for input(s): AMMONIA in the last 168 hours. CBC:  Recent Labs Lab 07/15/14 1954 07/16/14 1328 07/17/14 0430 07/18/14 0612  WBC 10.8* 12.3* 11.4* 9.6  NEUTROABS 7.3 9.7*  --   --   HGB 8.3* 8.4* 7.7* 8.1*  HCT 28.4* 27.8* 26.1* 28.0*  MCV 74.0* 72.6* 74.1* 74.5*  PLT 519* 477* 487* 523*   Cardiac Enzymes:   No results for input(s): CKTOTAL, CKMB, CKMBINDEX, TROPONINI in the last 168 hours. BNP (last 3 results) No results for input(s): BNP in the last 8760 hours.  ProBNP (last 3 results)  Recent Labs  07/19/13 1938  PROBNP 79.5    CBG: No results for input(s): GLUCAP in the last 168 hours.  Recent Results (from the past 240 hour(s))  Urine culture     Status: None   Collection Time: 07/15/14  8:18 PM  Result Value Ref Range Status   Specimen Description URINE, RANDOM  Final   Special Requests NONE  Final   Colony Count   Final    >=100,000 COLONIES/ML Performed at [facility]    Culture   Final     PSEUDOMONAS AERUGINOSA Performed at [facility]    Report Status 07/18/2014 FINAL  Final   Organism ID, Bacteria PSEUDOMONAS AERUGINOSA  Final      Susceptibility   Pseudomonas aeruginosa - MIC*    CEFEPIME 4 SENSITIVE Sensitive     CEFTAZIDIME 4 SENSITIVE Sensitive     CIPROFLOXACIN 2 INTERMEDIATE Intermediate     GENTAMICIN <=1 SENSITIVE Sensitive     IMIPENEM >=16 RESISTANT Resistant     PIP/TAZO 8 SENSITIVE Sensitive     TOBRAMYCIN <=1 SENSITIVE Sensitive     * PSEUDOMONAS AERUGINOSA  Culture, blood (routine x 2)     Status: None (Preliminary result)   Collection Time: 07/16/14  1:20 PM  Result Value Ref Range Status   Specimen Description BLOOD HAND LEFT  Final   Special Requests BOTTLES DRAWN AEROBIC AND ANAEROBIC 5CC  Final   Culture   Final           BLOOD CULTURE RECEIVED NO GROWTH TO DATE CULTURE WILL BE HELD FOR 5 DAYS BEFORE ISSUING A FINAL NEGATIVE REPORT Performed at [facility]    Report Status PENDING  Incomplete  Culture, blood (routine x 2)     Status: None (Preliminary result)   Collection Time: 07/16/14  1:45 PM  Result Value Ref Range Status   Specimen Description BLOOD HAND LEFT  Final   Special Requests BOTTLES DRAWN AEROBIC AND ANAEROBIC 5CC  Final   Culture   Final           BLOOD CULTURE RECEIVED NO GROWTH TO DATE CULTURE WILL BE HELD FOR 5 DAYS BEFORE ISSUING A FINAL NEGATIVE REPORT Performed at [facility]    Report Status PENDING  Incomplete  Urine culture     Status: None   Collection Time: 07/16/14  2:05 PM  Result Value Ref Range Status   Specimen Description URINE, RANDOM  Final   Special Requests NONE  Final   Colony Count   Final    >=100,000 COLONIES/ML Performed at [facility]    Culture   Final    ESCHERICHIA COLI Performed at [facility]    Report Status 07/18/2014 FINAL  Final   Organism ID, Bacteria ESCHERICHIA COLI  Final      Susceptibility   Escherichia coli - MIC*     AMPICILLIN <=2 SENSITIVE Sensitive     CEFAZOLIN <=4 SENSITIVE Sensitive     CEFTRIAXONE <=1 SENSITIVE Sensitive     CIPROFLOXACIN >=4 RESISTANT Resistant     GENTAMICIN <=1 SENSITIVE Sensitive     LEVOFLOXACIN >=8 RESISTANT Resistant     NITROFURANTOIN <=16 SENSITIVE Sensitive     TOBRAMYCIN <=1 SENSITIVE Sensitive     TRIMETH/SULFA >=320 RESISTANT Resistant     PIP/TAZO <=4 SENSITIVE Sensitive     * ESCHERICHIA COLI     Studies: No results found.  Scheduled Meds: . amLODipine  2.5  mg Oral q morning - 10a  . baclofen  20 mg Oral QID  . cefTRIAXone (ROCEPHIN)  IV  1 g Intravenous Q24H  . collagenase   Topical Daily  . darifenacin  15 mg Oral Daily  . famotidine  20 mg Oral BID  . feeding supplement  1 Container Oral TID BM  . heparin  5,000 Units Subcutaneous 3 times per day  . lactose free nutrition  237 mL Oral TID WC  . metoCLOPramide  10 mg Oral TID AC  . nutrition supplement (JUVEN)  1 packet Oral BID BM  . polyethylene glycol  17 g Oral Daily  . sodium chloride  3 mL Intravenous Q12H    Continuous Infusions: none     [name] [name]  [facility] Pager [phone]. If 7PM-7AM, please contact night-coverage at [email or web address], password [facility] 07/18/2014, 7:48 PM  LOS: 2 days

## 2014-07-19 MED ORDER — CEFDINIR 300 MG PO CAPS: 300.0000 mg | ORAL_CAPSULE | Freq: Two times a day (BID) | ORAL | Status: DC

## 2014-07-19 MED ORDER — FLORANEX PO PACK: 1.0000 g | PACK | Freq: Three times a day (TID) | ORAL | Status: DC

## 2014-07-19 NOTE — Clinical Social Work Note (Signed)
CSW made aware patient needs EMS coordination home by [facility]. RN verified home address and CSW prepared transportation packet. Packet was provided to RN. CSW to arrange transportation via [place]. No further needs. CSW signing off.  [place], [place] Weekend Clinical Social Worker [phone]

## 2014-07-19 NOTE — Discharge Summary (Signed)
Discharge Summary  [name] [name] BOF:[id number] DOB: 1965-10-23  PCP: [name], MD  Admit date: 07/16/2014 Discharge date: 07/19/2014  Time spent: less than 30mins  Recommendations for Outpatient Follow-up:  1. F/u with pcp in 1wk 2. F/u with plastic surgery Dr. [name] for wound care 3. F/u with infectious disease as previously scheduled  Discharge Diagnoses:  Active Hospital Problems   Diagnosis Date Noted  . Wound infection 12/22/2013  . UTI (lower urinary tract infection) 06/21/2013  . Sacral decubitus ulcer, stage IV 04/22/2012  . Suprapubic catheter 04/22/2012  . Quadriplegia 07/23/2011    Resolved Hospital Problems   Diagnosis Date Noted Date Resolved  No resolved problems to display.    Discharge Condition: stable, improved  Diet recommendation: heart healthy diet  Filed Weights   07/18/14 2030  Weight: 104.327 kg (230 lb)    History of present illness:  49 year old man presented to the emergency department with complaints of ongoing nausea and poor oral intake. He was sent to the emergency department by his wound care nurse was concerned about purulent drainage from wound. Initial evaluation in emergency department was concerning for possible wound infection or UTI.  Patient was seen in the emergency department at [facility] 3/1 or extensive workup was performed which was relatively unrevealing. The patient was discharged with antibiotics to use as needed, however there was no evidence of significant infection at that time.  Patient has chronic wounds with a history of chronic osteomyelitis, he has had some increased drainage from his wounds particularly the wound extending anteriorly. No definite fever is felt hot at home. He has had poor oral intake and nausea for the last 24 hours. Remainder of ROS as below.  In the emergency department afebrile, vital signs stable. No hypoxia. Complete metabolic panel unremarkable, lactic acid normal. Hemoglobin stable 8.4,  WBC modestly elevated 12.3. Urinalysis grossly positive. Blood cultures pending. Chest x-ray no acute disease.   Hospital Course:  Principal Problem:   Wound infection Active Problems:   Quadriplegia   Sacral decubitus ulcer, stage IV   Suprapubic catheter   UTI (lower urinary tract infection) 10.  UTI. Urine culture from 3/1 grew pseudomonas, from 3/2 grew ecoli, both sensitive to 3rd generation cephalosporin. Patient received rocephinx4doses in the hospital, with good response, n/v resolved, leukocytosis resolved. will prescribe cefdinir for three more days to cover complicated uti.  Sacral wounds with some extension to the scrotum. Concern for early infection? Lactic acid was normal. Hemodynamics are stable. Wound care consulted appreciated, patient is to see plastic surgery Dr. [name] in 1wk.  Chronic normocytic anemia appears to be at baseline.  GERD Quadriplegia, with superpubic catheter, colostomy, total care Morbid obesity  OSA   Code Status: full code  Family Communication: patient   Disposition Plan: home , resume home health   Consultants:  Wound  care  Procedures:  none  Antibiotics: rocephinx4doses   Discharge Exam: BP 136/88 mmHg  Pulse 72  Temp(Src) 97.7 F (36.5 C) (Axillary)  Resp 18  Ht 6' (1.829 m)  Wt 104.327 kg (230 lb)  BMI 31.19 kg/m2  SpO2 100%  General: Examined in the emergency department. Appears calm and comfortable Eyes: PERRL, normal lids, irises  ENT: grossly normal hearing, lips & tongue Neck: no LAD, masses or thyromegaly Cardiovascular: RRR, no m/r/g. No LE edema. Respiratory: CTA bilaterally, no w/r/r. Normal respiratory effort. Abdomen: soft, ntnd. +superpubic catheter, + colosotomy Skin: Sacral decubiti noted. There is significant skin breakdown extending to the scrotum without definite  features of acute infection. Musculoskeletal: Bilateral lower extremity contractures. Bilateral upper extremity  contractures. Psychiatric: grossly normal mood and affect, speech fluent and appropriate Neurologic: As above   Discharge Instructions You were cared for by a hospitalist during your hospital stay. If you have any questions about your discharge medications or the care you received while you were in the hospital after you are discharged, you can call the unit and asked to speak with the hospitalist on call if the hospitalist that took care of you is not available. Once you are discharged, your primary care physician will handle any further medical issues. Please note that NO REFILLS for any discharge medications will be authorized once you are discharged, as it is imperative that you return to your primary care physician (or establish a relationship with a primary care physician if you do not have one) for your aftercare needs so that they can reassess your need for medications and monitor your lab values.      Discharge Instructions    Diet - low sodium heart healthy    Complete by:  As directed      Increase activity slowly    Complete by:  As directed             Medication List    STOP taking these medications        cephALEXin 500 MG capsule  Commonly known as:  KEFLEX     sulfamethoxazole-trimethoprim 800-160 MG per tablet  Commonly known as:  SEPTRA DS      TAKE these medications        acetaminophen 500 MG tablet  Commonly known as:  TYLENOL  Take 1,000 mg by mouth every 6 (six) hours as needed.     amLODipine 2.5 MG tablet  Commonly known as:  NORVASC  Take 1 tablet (2.5 mg total) by mouth every morning.     baclofen 20 MG tablet  Commonly known as:  LIORESAL  Take 20 mg by mouth 4 (four) times daily.     cefdinir 300 MG capsule  Commonly known as:  OMNICEF  Take 1 capsule (300 mg total) by mouth 2 (two) times daily.     collagenase ointment  Commonly known as:  SANTYL  Apply 1 application topically daily.     docusate sodium 100 MG capsule  Commonly known  as:  COLACE  Take 100 mg by mouth 2 (two) times daily as needed for mild constipation.     famotidine 20 MG tablet  Commonly known as:  PEPCID  Take 20 mg by mouth 2 (two) times daily.     furosemide 20 MG tablet  Commonly known as:  LASIX  Take 1 tablet (20 mg total) by mouth daily as needed.     lactobacillus Pack  Take 1 packet (1 g total) by mouth 3 (three) times daily with meals.     meloxicam 7.5 MG tablet  Commonly known as:  MOBIC  Take 7.5 mg by mouth 2 (two) times daily as needed for pain.     metoCLOPramide 10 MG tablet  Commonly known as:  REGLAN  Take 1 tablet (10 mg total) by mouth 3 (three) times daily before meals.     multivitamin with minerals Tabs tablet  Take 1 tablet by mouth every morning.     feeding supplement Liqd  Take 1 Container by mouth 3 (three) times daily between meals.     nutrition supplement (JUVEN) Pack  Take 1 packet by mouth  2 (two) times daily between meals.     Oxycodone HCl 10 MG Tabs  Take 10 mg by mouth every 6 (six) hours as needed (for pain).     polyethylene glycol packet  Commonly known as:  MIRALAX / GLYCOLAX  Take 17 g by mouth daily.     potassium chloride SA 20 MEQ tablet  Commonly known as:  K-DUR,KLOR-CON  Take 2 tablets (40 mEq total) by mouth daily as needed.     VESICARE 10 MG tablet  Generic drug:  solifenacin  Take 10 mg by mouth daily.     vitamin C 500 MG tablet  Commonly known as:  ASCORBIC ACID  Take 500 mg by mouth every morning.     zinc sulfate 220 MG capsule  Take 220 mg by mouth every morning.       Allergies  Allergen Reactions  . Ditropan [Oxybutynin] Other (See Comments)    hallucinations   Follow-up Information    Follow up with [name], MD In 1 week.   Specialty:  Internal Medicine   Contact information:   [address] [place] [address] [phone]       Follow up with [facility], DO In 2 weeks.   Specialty:  Plastic Surgery   Contact  information:   [place] [address] [phone]        The results of significant diagnostics from this hospitalization (including imaging, microbiology, ancillary and laboratory) are listed below for reference.    Significant Diagnostic Studies: Dg Chest 2 View  07/15/2014   CLINICAL DATA:  Fever and chills.  Patient is paraplegic.  EXAM: CHEST  2 VIEW  COMPARISON:  05/10/2014  FINDINGS: Chronic densities in the right upper chest have been present since 07/18/2011. Heart size is upper limits of normal but unchanged. The trachea is midline. Surgical wires in the lower cervical spine. Lateral view is very limited. Visualized lungs are clear. Lung bases are incompletely imaged.  IMPRESSION: No acute chest abnormality.   Electronically Signed   By: Markus Daft M.D.   On: 07/15/2014 20:20   Dg Pelvis 1-2 Views  07/15/2014   CLINICAL DATA:  Fever.  Sacral decubitus ulcers.  EXAM: PELVIS - 1-2 VIEW  COMPARISON:  Radiographs dated 05/10/2014 and CT scan dated 04/17/2014  FINDINGS: There is severe chronic degenerative erosive and dystrophic changes of both hips with chronic dislocation of both proximal femurs. Previous resection or erosion of right inferior pubic ramus adjacent to the decubitus ulcer. Chronic fusion of both sacroiliac joints.  Chronic deformity of the right iliac bone.  IMPRESSION: No appreciable change in the appearance of the pelvis and hips since the prior studies of 05/10/2014 and 04/17/2014.   Electronically Signed   By: Lorriane Shire M.D.   On: 07/15/2014 20:21    Microbiology: Recent Results (from the past 240 hour(s))  Urine culture     Status: None   Collection Time: 07/15/14  8:18 PM  Result Value Ref Range Status   Specimen Description URINE, RANDOM  Final   Special Requests NONE  Final   Colony Count   Final    >=100,000 COLONIES/ML Performed at [facility]    Culture   Final    PSEUDOMONAS AERUGINOSA Performed at [facility]    Report Status 07/18/2014 FINAL  Final   Organism ID, Bacteria PSEUDOMONAS AERUGINOSA  Final      Susceptibility   Pseudomonas aeruginosa - MIC*  CEFEPIME 4 SENSITIVE Sensitive     CEFTAZIDIME 4 SENSITIVE Sensitive     CIPROFLOXACIN 2 INTERMEDIATE Intermediate     GENTAMICIN <=1 SENSITIVE Sensitive     IMIPENEM >=16 RESISTANT Resistant     PIP/TAZO 8 SENSITIVE Sensitive     TOBRAMYCIN <=1 SENSITIVE Sensitive     * PSEUDOMONAS AERUGINOSA  Culture, blood (routine x 2)     Status: None (Preliminary result)   Collection Time: 07/16/14  1:20 PM  Result Value Ref Range Status   Specimen Description BLOOD HAND LEFT  Final   Special Requests BOTTLES DRAWN AEROBIC AND ANAEROBIC 5CC  Final   Culture   Final           BLOOD CULTURE RECEIVED NO GROWTH TO DATE CULTURE WILL BE HELD FOR 5 DAYS BEFORE ISSUING A FINAL NEGATIVE REPORT Performed at [facility]    Report Status PENDING  Incomplete  Culture, blood (routine x 2)     Status: None (Preliminary result)   Collection Time: 07/16/14  1:45 PM  Result Value Ref Range Status   Specimen Description BLOOD HAND LEFT  Final   Special Requests BOTTLES DRAWN AEROBIC AND ANAEROBIC 5CC  Final   Culture   Final           BLOOD CULTURE RECEIVED NO GROWTH TO DATE CULTURE WILL BE HELD FOR 5 DAYS BEFORE ISSUING A FINAL NEGATIVE REPORT Performed at [facility]    Report Status PENDING  Incomplete  Urine culture     Status: None   Collection Time: 07/16/14  2:05 PM  Result Value Ref Range Status   Specimen Description URINE, RANDOM  Final   Special Requests NONE  Final   Colony Count   Final    >=100,000 COLONIES/ML Performed at [facility]    Culture   Final    ESCHERICHIA COLI Performed at [facility]    Report Status 07/18/2014 FINAL  Final   Organism ID, Bacteria ESCHERICHIA COLI  Final      Susceptibility   Escherichia coli - MIC*    AMPICILLIN <=2 SENSITIVE Sensitive     CEFAZOLIN <=4  SENSITIVE Sensitive     CEFTRIAXONE <=1 SENSITIVE Sensitive     CIPROFLOXACIN >=4 RESISTANT Resistant     GENTAMICIN <=1 SENSITIVE Sensitive     LEVOFLOXACIN >=8 RESISTANT Resistant     NITROFURANTOIN <=16 SENSITIVE Sensitive     TOBRAMYCIN <=1 SENSITIVE Sensitive     TRIMETH/SULFA >=320 RESISTANT Resistant     PIP/TAZO <=4 SENSITIVE Sensitive     * ESCHERICHIA COLI     Labs: Basic Metabolic Panel:  Recent Labs Lab 07/15/14 1954 07/16/14 1328 07/17/14 0430 07/18/14 0612  NA 135 140 138 137  K 4.1 3.7 3.8 3.9  CL 105 104 103 102  CO2 26 24 27 24   GLUCOSE 99 94 84 98  BUN 17 14 19 14   CREATININE 0.51 0.57 0.68 0.49*  CALCIUM 8.6 8.4 8.4 8.8   Liver Function Tests:  Recent Labs Lab 07/15/14 1954 07/16/14 1328  AST 21 14  ALT 14 13  ALKPHOS 74 66  BILITOT 0.2* 0.5  PROT 8.0 7.5  ALBUMIN 2.9* 2.6*    Recent Labs Lab 07/16/14 1328  LIPASE 27   No results for input(s): AMMONIA in the last 168 hours. CBC:  Recent Labs Lab 07/15/14 1954 07/16/14 1328 07/17/14 0430 07/18/14 0612  WBC 10.8* 12.3* 11.4* 9.6  NEUTROABS 7.3 9.7*  --   --  HGB 8.3* 8.4* 7.7* 8.1*  HCT 28.4* 27.8* 26.1* 28.0*  MCV 74.0* 72.6* 74.1* 74.5*  PLT 519* 477* 487* 523*   Cardiac Enzymes: No results for input(s): CKTOTAL, CKMB, CKMBINDEX, TROPONINI in the last 168 hours. BNP: BNP (last 3 results) No results for input(s): BNP in the last 8760 hours.  ProBNP (last 3 results)  Recent Labs  07/19/13 1938  PROBNP 79.5    CBG: No results for input(s): GLUCAP in the last 168 hours.     Signed:  Dhrithi Riche  [facility] 07/19/2014, 10:40 AM

## 2014-07-19 NOTE — Progress Notes (Signed)
Discharge instructions gone over. Home medications gone over. Prescriptions electronically sent. Patient is being discharged with home with [place], RN. Follow up appointments to be made. Patient verbalized understanding of instructions. Patient will transport home via ambulance and transportation has been notified.

## 2014-07-22 LAB — CULTURE, BLOOD (ROUTINE X 2)
Culture: NO GROWTH
Culture: NO GROWTH

## 2014-07-28 ENCOUNTER — Encounter (HOSPITAL_BASED_OUTPATIENT_CLINIC_OR_DEPARTMENT_OTHER): Payer: Medicare Other | Attending: Plastic Surgery

## 2014-07-28 DIAGNOSIS — L89119 Pressure ulcer of right upper back, unspecified stage: Secondary | ICD-10-CM | POA: Insufficient documentation

## 2014-07-28 DIAGNOSIS — L89159 Pressure ulcer of sacral region, unspecified stage: Secondary | ICD-10-CM | POA: Diagnosis not present

## 2014-07-28 DIAGNOSIS — L89154 Pressure ulcer of sacral region, stage 4: Secondary | ICD-10-CM | POA: Diagnosis present

## 2014-07-28 DIAGNOSIS — L89899 Pressure ulcer of other site, unspecified stage: Secondary | ICD-10-CM | POA: Diagnosis not present

## 2014-07-28 DIAGNOSIS — T8389XA Other specified complication of genitourinary prosthetic devices, implants and grafts, initial encounter: Secondary | ICD-10-CM | POA: Insufficient documentation

## 2014-08-03 IMAGING — CR DG ABDOMEN 1V
3 series · 3 of 3 positions shown · non-contrast
Comparison: 02/25/2011

CLINICAL DATA: Upper abdominal pain for 3 days.

ABDOMEN - 1 VIEW

[x abdomen supine (1 of 3)]
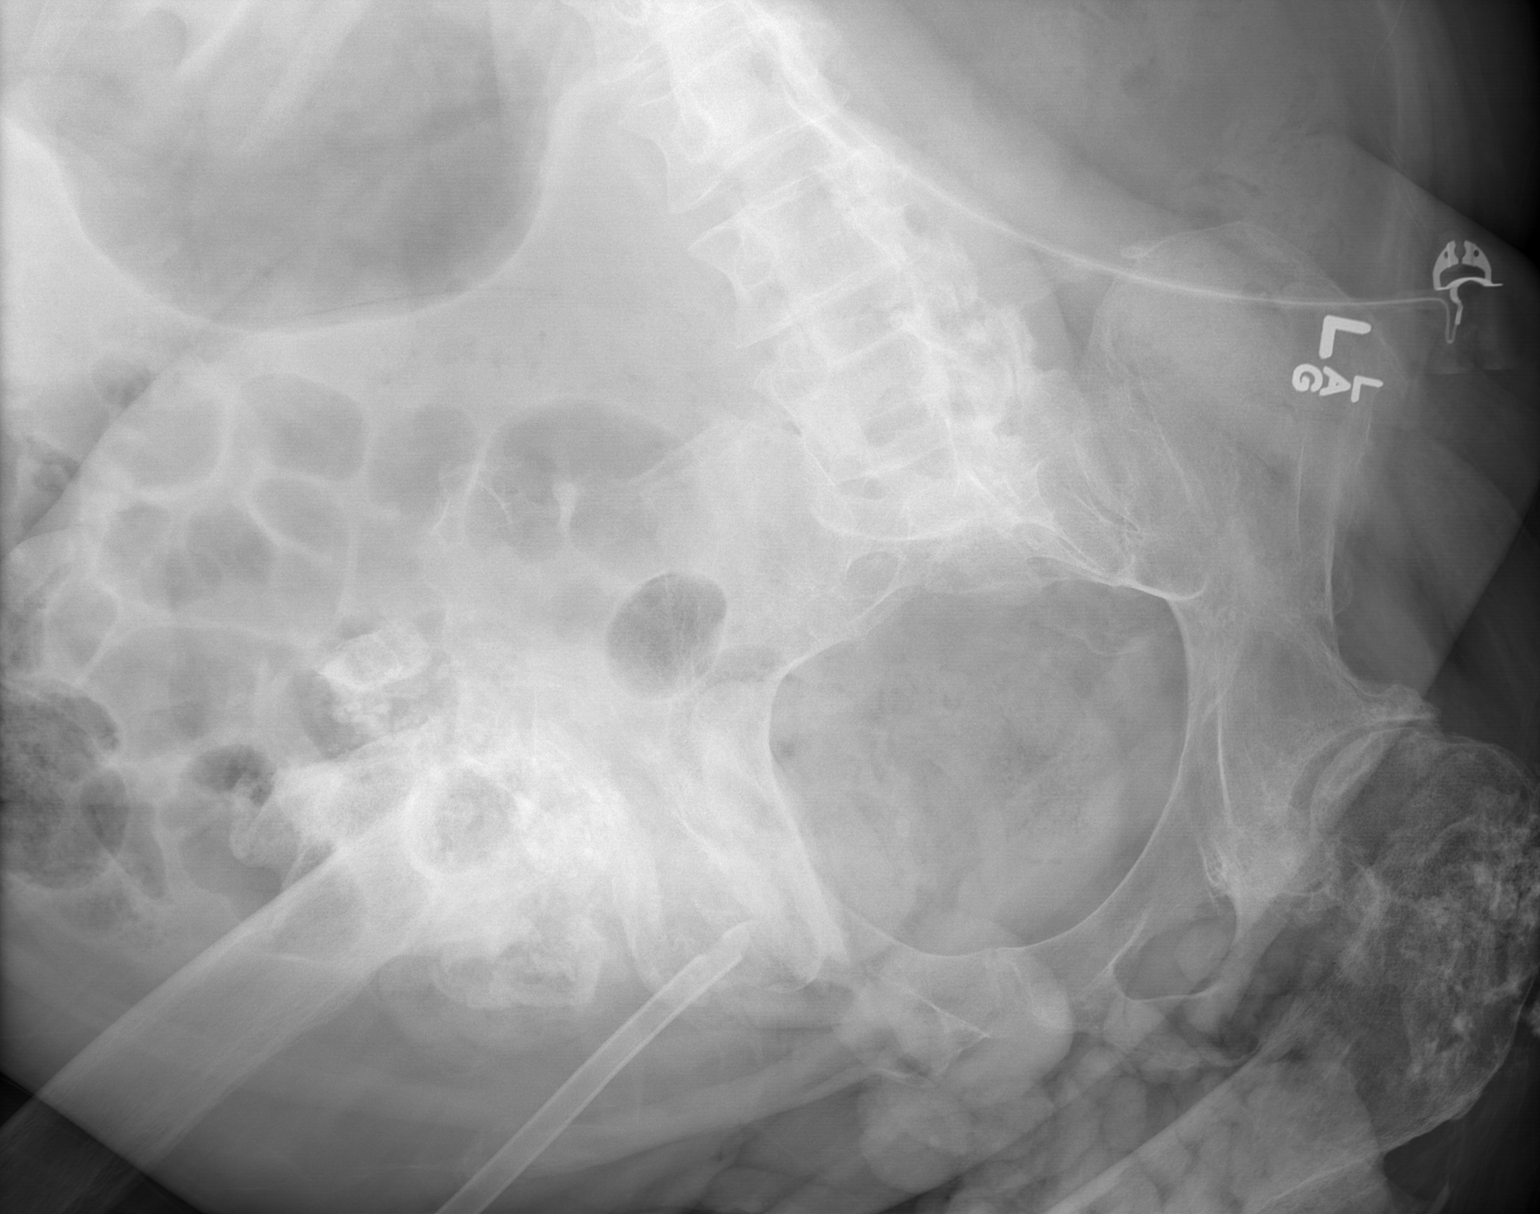

[x abdomen supine (2 of 3)]
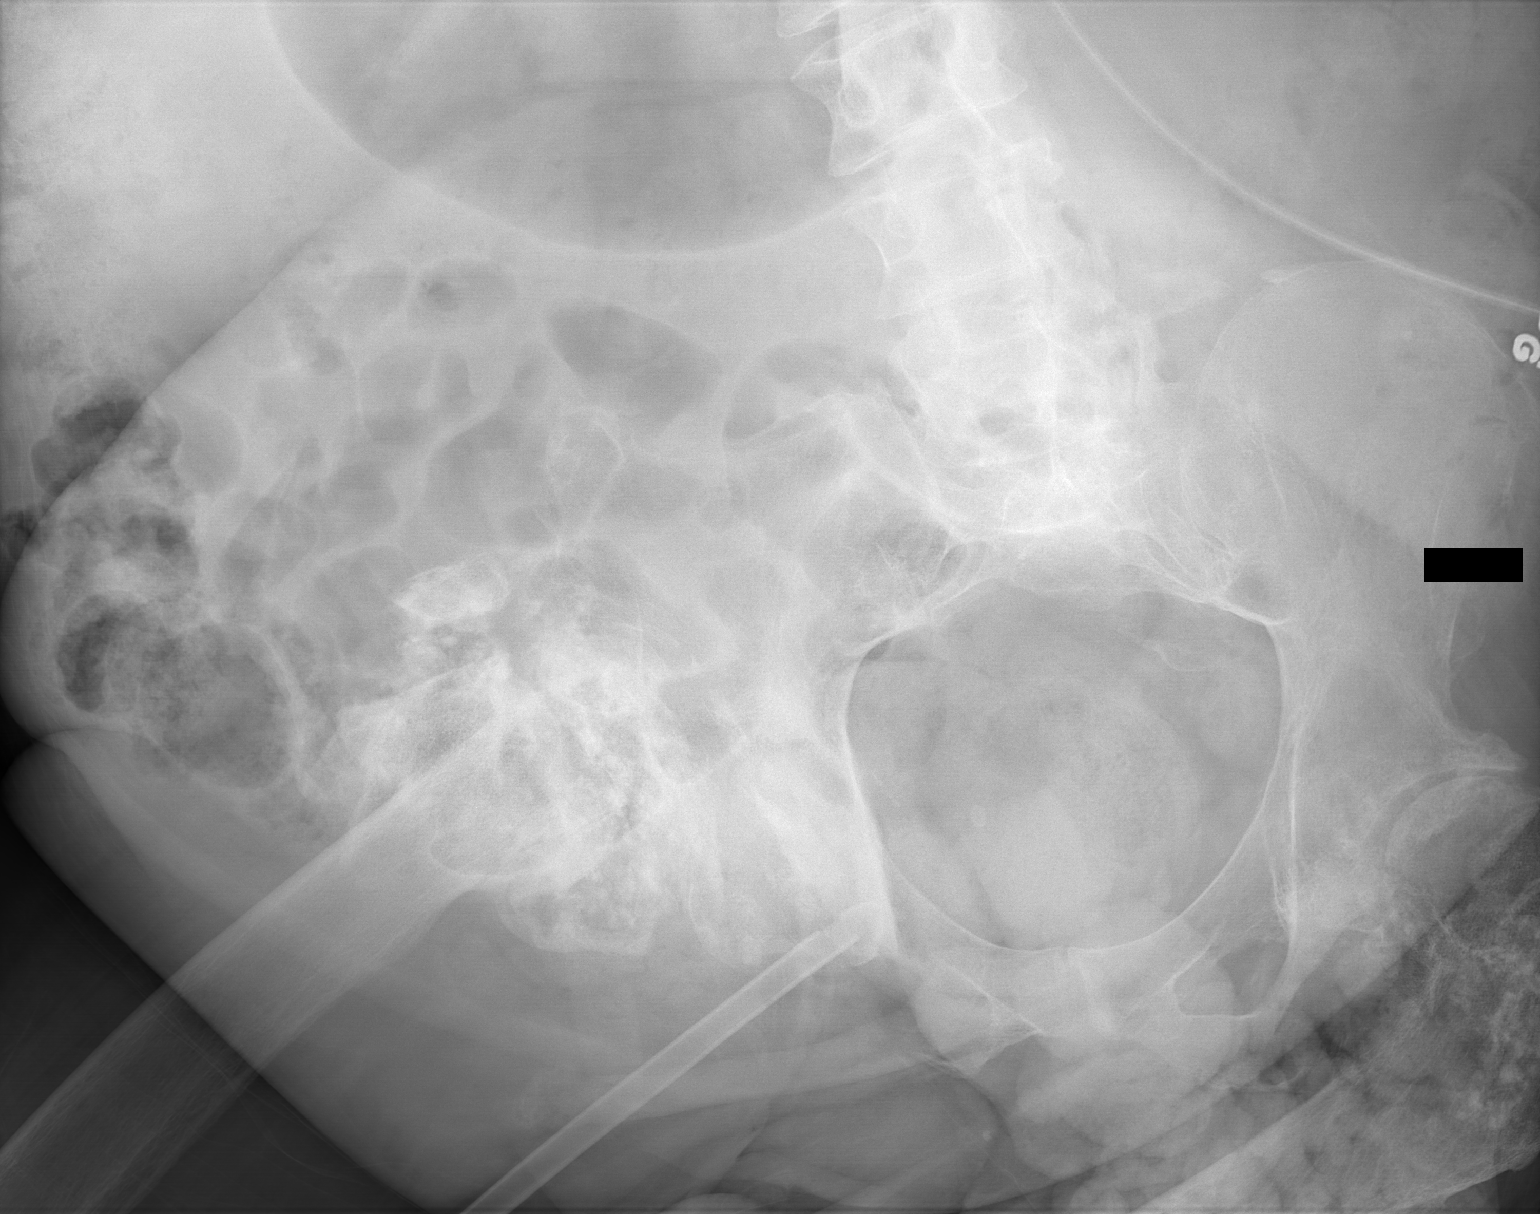

[x abdomen supine (3 of 3)]
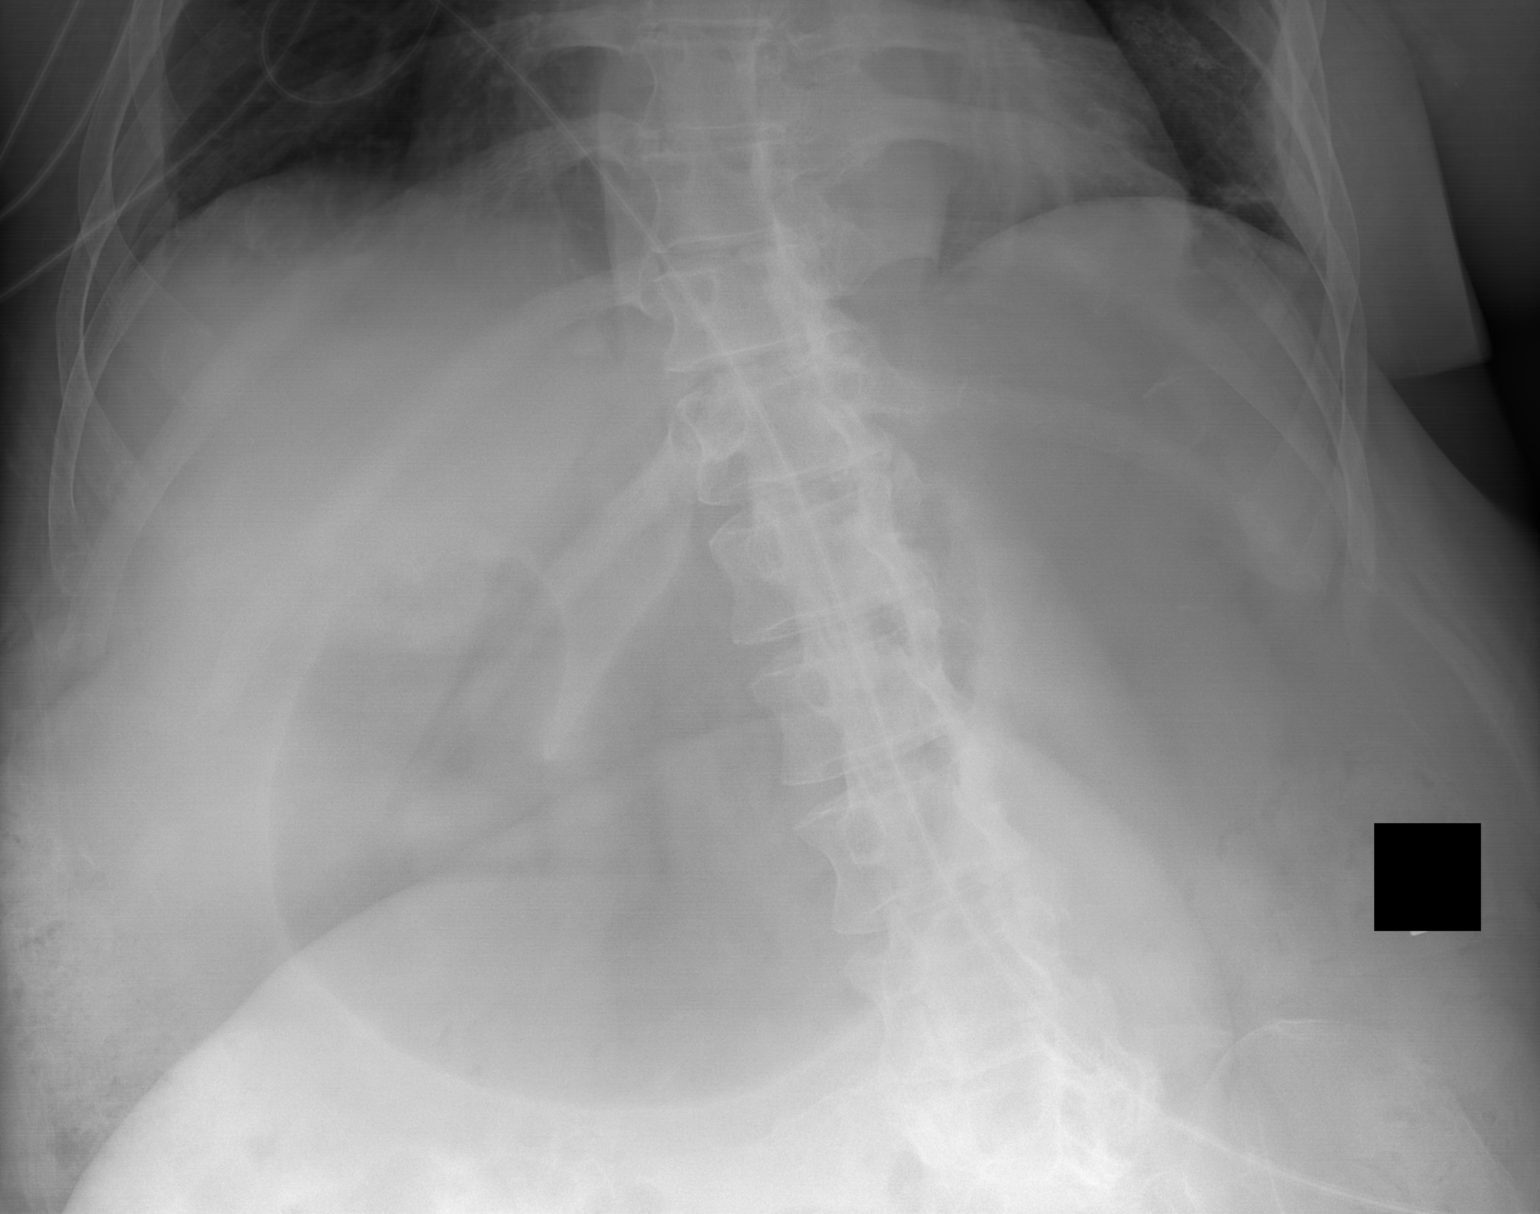

[3 of 3 positions shown; findings below may reference images not displayed]

FINDINGS: Moderate gaseous distension of the stomach.  This could
be due to dysmotility, gastroparesis, or outlet obstruction.
Scattered gas and stool in the colon and small bowel.  No small or
large bowel distension.  A large amount of stool appears to be in
the rectosigmoid region.  Marked degenerative changes and
deformities in the hips, similar to previous study and likely
chronic.
IMPRESSION: Gaseous distension of the stomach.  No evidence of bowel
obstruction.  Chronic bony changes in the hips.

## 2014-08-11 DIAGNOSIS — L89899 Pressure ulcer of other site, unspecified stage: Secondary | ICD-10-CM | POA: Diagnosis not present

## 2014-08-11 DIAGNOSIS — L89159 Pressure ulcer of sacral region, unspecified stage: Secondary | ICD-10-CM | POA: Diagnosis not present

## 2014-08-11 DIAGNOSIS — L89119 Pressure ulcer of right upper back, unspecified stage: Secondary | ICD-10-CM | POA: Diagnosis not present

## 2014-08-11 DIAGNOSIS — L89154 Pressure ulcer of sacral region, stage 4: Secondary | ICD-10-CM | POA: Diagnosis not present

## 2014-08-20 NOTE — Progress Notes (Signed)
Please put orders in Epic for same day surgery 08-25-14 Thanks

## 2014-08-21 ENCOUNTER — Encounter (HOSPITAL_BASED_OUTPATIENT_CLINIC_OR_DEPARTMENT_OTHER): Payer: Self-pay | Admitting: *Deleted

## 2014-08-26 ENCOUNTER — Other Ambulatory Visit: Payer: Self-pay | Admitting: Plastic Surgery

## 2014-08-26 DIAGNOSIS — L98493 Non-pressure chronic ulcer of skin of other sites with necrosis of muscle: Secondary | ICD-10-CM

## 2014-08-27 ENCOUNTER — Encounter (HOSPITAL_COMMUNITY): Payer: Self-pay | Admitting: *Deleted

## 2014-08-27 MED ORDER — CEFAZOLIN SODIUM-DEXTROSE 2-3 GM-% IV SOLR
2.0000 g | INTRAVENOUS | Status: AC
  Administered 2014-08-28: 2 g via INTRAVENOUS
  Filled 2014-08-27: qty 50

## 2014-08-27 NOTE — Progress Notes (Signed)
Pt denies SOB, chest pain, and being under the care of a cardiologist. Pt denies having a stress test, echo and cardiac cath. Pt made aware to stop taking otc vitamins, Aspirin and herbal medications. Do not take any NSAIDs ie: Ibuprofen, Advil, Naproxen or any medication containing Aspirin. Pt verbalized understanding of all pre-op instructions.

## 2014-08-28 ENCOUNTER — Observation Stay (HOSPITAL_BASED_OUTPATIENT_CLINIC_OR_DEPARTMENT_OTHER)
Admission: RE | Admit: 2014-08-28 | Discharge: 2014-08-29 | Disposition: A | Discharge status: Home Health | Payer: Medicare Other | Source: Ambulatory Visit | Attending: Plastic Surgery | Admitting: Plastic Surgery

## 2014-08-28 ENCOUNTER — Encounter (HOSPITAL_COMMUNITY): Payer: Self-pay | Admitting: Critical Care Medicine

## 2014-08-28 ENCOUNTER — Encounter (HOSPITAL_COMMUNITY)
Admission: RE | Disposition: A | Discharge status: Home Health | Payer: Self-pay | Source: Ambulatory Visit | Attending: Plastic Surgery

## 2014-08-28 ENCOUNTER — Ambulatory Visit (HOSPITAL_COMMUNITY): Payer: Medicare Other | Admitting: Anesthesiology

## 2014-08-28 DIAGNOSIS — Z79891 Long term (current) use of opiate analgesic: Secondary | ICD-10-CM | POA: Insufficient documentation

## 2014-08-28 DIAGNOSIS — Z6830 Body mass index (BMI) 30.0-30.9, adult: Secondary | ICD-10-CM | POA: Insufficient documentation

## 2014-08-28 DIAGNOSIS — F329 Major depressive disorder, single episode, unspecified: Secondary | ICD-10-CM | POA: Diagnosis not present

## 2014-08-28 DIAGNOSIS — G8250 Quadriplegia, unspecified: Secondary | ICD-10-CM | POA: Diagnosis not present

## 2014-08-28 DIAGNOSIS — L98493 Non-pressure chronic ulcer of skin of other sites with necrosis of muscle: Secondary | ICD-10-CM

## 2014-08-28 DIAGNOSIS — K219 Gastro-esophageal reflux disease without esophagitis: Secondary | ICD-10-CM | POA: Diagnosis not present

## 2014-08-28 DIAGNOSIS — L98479 Non-pressure chronic ulcer of groin with unspecified severity: Secondary | ICD-10-CM | POA: Diagnosis present

## 2014-08-28 DIAGNOSIS — I10 Essential (primary) hypertension: Secondary | ICD-10-CM | POA: Diagnosis not present

## 2014-08-28 DIAGNOSIS — L98499 Non-pressure chronic ulcer of skin of other sites with unspecified severity: Principal | ICD-10-CM | POA: Insufficient documentation

## 2014-08-28 DIAGNOSIS — E662 Morbid (severe) obesity with alveolar hypoventilation: Secondary | ICD-10-CM | POA: Insufficient documentation

## 2014-08-28 HISTORY — PX: DEBRIDEMENT AND CLOSURE WOUND: SHX5614

## 2014-08-28 HISTORY — DX: Non-pressure chronic ulcer of skin of other sites with unspecified severity: L98.499

## 2014-08-28 LAB — SURGICAL PCR SCREEN
MRSA, PCR: NEGATIVE
Staphylococcus aureus: NEGATIVE

## 2014-08-28 LAB — BASIC METABOLIC PANEL
ANION GAP: 10 (ref 5–15)
BUN: 14 mg/dL (ref 6–23)
CO2: 24 mmol/L (ref 19–32)
Calcium: 8.8 mg/dL (ref 8.4–10.5)
Chloride: 104 mmol/L (ref 96–112)
Creatinine, Ser: 0.41 mg/dL — ABNORMAL LOW (ref 0.50–1.35)
GFR calc non Af Amer: >90 mL/min (ref >90)
Glucose, Bld: 111 mg/dL — ABNORMAL HIGH (ref 70–99)
POTASSIUM: 4 mmol/L (ref 3.5–5.1)
Sodium: 138 mmol/L (ref 135–145)

## 2014-08-28 LAB — CBC
HCT: 25.9 % — ABNORMAL LOW (ref 39.0–52.0)
Hemoglobin: 7.6 g/dL — ABNORMAL LOW (ref 13.0–17.0)
MCH: 20.9 pg — AB (ref 26.0–34.0)
MCHC: 29.3 g/dL — AB (ref 30.0–36.0)
MCV: 71.2 fL — AB (ref 78.0–100.0)
Platelets: 574 10*3/uL — ABNORMAL HIGH (ref 150–400)
RBC: 3.64 MIL/uL — ABNORMAL LOW (ref 4.22–5.81)
RDW: 17.9 % — ABNORMAL HIGH (ref 11.5–15.5)
WBC: 14.7 10*3/uL — ABNORMAL HIGH (ref 4.0–10.5)

## 2014-08-28 LAB — GLUCOSE, CAPILLARY: Glucose-Capillary: 89 mg/dL (ref 70–99)

## 2014-08-28 SURGERY — DEBRIDEMENT, WOUND, WITH CLOSURE
Anesthesia: General | Site: Groin | Laterality: Right

## 2014-08-28 MED ORDER — DOUBLE ANTIBIOTIC 500-10000 UNIT/GM EX OINT
TOPICAL_OINTMENT | CUTANEOUS | Status: AC
  Filled 2014-08-28: qty 1

## 2014-08-28 MED ORDER — FENTANYL CITRATE 0.05 MG/ML IJ SOLN
INTRAMUSCULAR | Status: AC
  Filled 2014-08-28: qty 5

## 2014-08-28 MED ORDER — POLYMYXIN B SULFATE 500000 UNITS IJ SOLR
INTRAMUSCULAR | Status: DC | PRN
  Administered 2014-08-28: 1000 mL

## 2014-08-28 MED ORDER — MUPIROCIN 2 % EX OINT
1.0000 "application " | TOPICAL_OINTMENT | Freq: Once | CUTANEOUS | Status: AC
  Administered 2014-08-28: 1 via TOPICAL

## 2014-08-28 MED ORDER — LIDOCAINE HCL (CARDIAC) 10 MG/ML IV SOLN
INTRAVENOUS | Status: DC | PRN
  Administered 2014-08-28: 70 mg via INTRAVENOUS

## 2014-08-28 MED ORDER — ONDANSETRON HCL 4 MG/2ML IJ SOLN
INTRAMUSCULAR | Status: AC
  Filled 2014-08-28: qty 2

## 2014-08-28 MED ORDER — OXYCODONE HCL 5 MG PO TABS
ORAL_TABLET | ORAL | Status: AC
  Filled 2014-08-28: qty 1

## 2014-08-28 MED ORDER — HYDROMORPHONE HCL 1 MG/ML IJ SOLN: 0.2500 mg | INTRAMUSCULAR | Status: DC | PRN

## 2014-08-28 MED ORDER — HEPARIN SODIUM (PORCINE) 1000 UNIT/ML IJ SOLN
INTRAMUSCULAR | Status: AC
  Filled 2014-08-28: qty 1

## 2014-08-28 MED ORDER — PROPOFOL 10 MG/ML IV BOLUS
INTRAVENOUS | Status: DC | PRN
  Administered 2014-08-28 (×2): 100 mg via INTRAVENOUS

## 2014-08-28 MED ORDER — PANTOPRAZOLE SODIUM 40 MG IV SOLR: 40.0000 mg | Freq: Every day | INTRAVENOUS | Status: DC

## 2014-08-28 MED ORDER — SODIUM CHLORIDE 0.9 % IV SOLN: 3.0000 g | Freq: Four times a day (QID) | INTRAVENOUS | Status: DC

## 2014-08-28 MED ORDER — OXYCODONE HCL 5 MG/5ML PO SOLN: 5.0000 mg | Freq: Once | ORAL | Status: AC | PRN

## 2014-08-28 MED ORDER — HYDROCODONE-ACETAMINOPHEN 5-325 MG PO TABS: 1.0000 | ORAL_TABLET | ORAL | Status: DC | PRN

## 2014-08-28 MED ORDER — SODIUM CHLORIDE 0.9 % IV SOLN
INTRAVENOUS | Status: DC
  Filled 2014-08-28: qty 1

## 2014-08-28 MED ORDER — MUPIROCIN 2 % EX OINT
TOPICAL_OINTMENT | CUTANEOUS | Status: AC
  Filled 2014-08-28: qty 22

## 2014-08-28 MED ORDER — OXYCODONE HCL 5 MG PO TABS
5.0000 mg | ORAL_TABLET | Freq: Once | ORAL | Status: AC | PRN
  Administered 2014-08-28: 5 mg via ORAL

## 2014-08-28 MED ORDER — ACETAMINOPHEN 325 MG PO TABS
ORAL_TABLET | ORAL | Status: AC
Start: 2014-08-28 — End: 2014-08-29
  Filled 2014-08-28: qty 2

## 2014-08-28 MED ORDER — PANTOPRAZOLE SODIUM 40 MG PO TBEC
40.0000 mg | DELAYED_RELEASE_TABLET | Freq: Every day | ORAL | Status: DC
  Administered 2014-08-28: 40 mg via ORAL
  Filled 2014-08-28 (×2): qty 1

## 2014-08-28 MED ORDER — PROPOFOL 10 MG/ML IV BOLUS
INTRAVENOUS | Status: AC
  Filled 2014-08-28: qty 20

## 2014-08-28 MED ORDER — MEPERIDINE HCL 25 MG/ML IJ SOLN: 6.2500 mg | INTRAMUSCULAR | Status: DC | PRN

## 2014-08-28 MED ORDER — MIDAZOLAM HCL 2 MG/2ML IJ SOLN
INTRAMUSCULAR | Status: AC
  Filled 2014-08-28: qty 2

## 2014-08-28 MED ORDER — AMOXICILLIN-POT CLAVULANATE 875-125 MG PO TABS
1.0000 | ORAL_TABLET | Freq: Two times a day (BID) | ORAL | Status: DC
  Administered 2014-08-28: 1 via ORAL
  Filled 2014-08-28: qty 1

## 2014-08-28 MED ORDER — SODIUM CHLORIDE 0.9 % IR SOLN: Status: DC | PRN

## 2014-08-28 MED ORDER — ONDANSETRON HCL 4 MG/2ML IJ SOLN
INTRAMUSCULAR | Status: DC | PRN
Start: 2014-08-28 — End: 2014-08-28
  Administered 2014-08-28: 4 mg via INTRAVENOUS

## 2014-08-28 MED ORDER — MIDAZOLAM HCL 5 MG/5ML IJ SOLN
INTRAMUSCULAR | Status: DC | PRN
  Administered 2014-08-28: 1 mg via INTRAVENOUS

## 2014-08-28 MED ORDER — LACTATED RINGERS IV SOLN
INTRAVENOUS | Status: DC | PRN
  Administered 2014-08-28: 14:00:00 via INTRAVENOUS

## 2014-08-28 MED ORDER — PHENYLEPHRINE HCL 10 MG/ML IJ SOLN
INTRAMUSCULAR | Status: DC | PRN
  Administered 2014-08-28 (×4): 80 ug via INTRAVENOUS

## 2014-08-28 MED ORDER — MORPHINE SULFATE 4 MG/ML IJ SOLN: 4.0000 mg | INTRAMUSCULAR | Status: DC | PRN

## 2014-08-28 MED ORDER — POLYETHYLENE GLYCOL 3350 17 G PO PACK
17.0000 g | PACK | Freq: Every day | ORAL | Status: DC | PRN
  Filled 2014-08-28: qty 1

## 2014-08-28 MED ORDER — PROMETHAZINE HCL 25 MG/ML IJ SOLN: 6.2500 mg | INTRAMUSCULAR | Status: DC | PRN

## 2014-08-28 MED ORDER — FENTANYL CITRATE (PF) 250 MCG/5ML IJ SOLN
INTRAMUSCULAR | Status: DC | PRN
  Administered 2014-08-28 (×2): 25 ug via INTRAVENOUS

## 2014-08-28 MED ORDER — SENNA 8.6 MG PO TABS
1.0000 | ORAL_TABLET | Freq: Two times a day (BID) | ORAL | Status: DC
Start: 2014-08-28 — End: 2014-08-29
  Administered 2014-08-28: 8.6 mg via ORAL
  Filled 2014-08-28 (×2): qty 1

## 2014-08-28 SURGICAL SUPPLY — 41 items
APL SKNCLS STERI-STRIP NONHPOA (GAUZE/BANDAGES/DRESSINGS) ×1
BAG DECANTER FOR FLEXI CONT (MISCELLANEOUS) IMPLANT
BENZOIN TINCTURE PRP APPL 2/3 (GAUZE/BANDAGES/DRESSINGS) ×2 IMPLANT
CANISTER SUCTION 2500CC (MISCELLANEOUS) ×2 IMPLANT
CONT SPEC STER OR (MISCELLANEOUS) IMPLANT
COVER SURGICAL LIGHT HANDLE (MISCELLANEOUS) ×2 IMPLANT
DRAPE IMP U-DRAPE 54X76 (DRAPES) ×2 IMPLANT
DRAPE INCISE IOBAN 66X45 STRL (DRAPES) IMPLANT
DRAPE LAPAROSCOPIC ABDOMINAL (DRAPES) IMPLANT
DRAPE PED LAPAROTOMY (DRAPES) ×2 IMPLANT
DRAPE PROXIMA HALF (DRAPES) IMPLANT
DRSG ADAPTIC 3X8 NADH LF (GAUZE/BANDAGES/DRESSINGS) IMPLANT
DRSG MEPILEX BORDER 4X8 (GAUZE/BANDAGES/DRESSINGS) ×1 IMPLANT
DRSG PAD ABDOMINAL 8X10 ST (GAUZE/BANDAGES/DRESSINGS) ×1 IMPLANT
DRSG VAC ATS LRG SENSATRAC (GAUZE/BANDAGES/DRESSINGS) IMPLANT
DRSG VAC ATS MED SENSATRAC (GAUZE/BANDAGES/DRESSINGS) IMPLANT
DRSG VAC ATS SM SENSATRAC (GAUZE/BANDAGES/DRESSINGS) IMPLANT
ELECT CAUTERY BLADE 6.4 (BLADE) IMPLANT
ELECT REM PT RETURN 9FT ADLT (ELECTROSURGICAL) ×2
ELECTRODE REM PT RTRN 9FT ADLT (ELECTROSURGICAL) ×1 IMPLANT
GAUZE SPONGE 4X4 12PLY STRL (GAUZE/BANDAGES/DRESSINGS) ×2 IMPLANT
GLOVE BIO SURGEON STRL SZ 6.5 (GLOVE) ×2 IMPLANT
GOWN STRL REUS W/ TWL LRG LVL3 (GOWN DISPOSABLE) ×3 IMPLANT
GOWN STRL REUS W/TWL LRG LVL3 (GOWN DISPOSABLE) ×6
KIT BASIN OR (CUSTOM PROCEDURE TRAY) ×2 IMPLANT
KIT ROOM TURNOVER OR (KITS) ×2 IMPLANT
MATRIX WOUND MESHED 2X2 (Tissue) IMPLANT
NS IRRIG 1000ML POUR BTL (IV SOLUTION) ×2 IMPLANT
PACK GENERAL/GYN (CUSTOM PROCEDURE TRAY) ×2 IMPLANT
PACK UNIVERSAL I (CUSTOM PROCEDURE TRAY) ×2 IMPLANT
PAD ARMBOARD 7.5X6 YLW CONV (MISCELLANEOUS) ×4 IMPLANT
SPONGE GAUZE 4X4 12PLY STER LF (GAUZE/BANDAGES/DRESSINGS) ×1 IMPLANT
STAPLER VISISTAT 35W (STAPLE) ×2 IMPLANT
SURGILUBE 2OZ TUBE FLIPTOP (MISCELLANEOUS) IMPLANT
SUT VIC AB 5-0 PS2 18 (SUTURE) ×2 IMPLANT
SWAB COLLECTION DEVICE MRSA (MISCELLANEOUS) IMPLANT
TAPE CLOTH SURG 4X10 WHT LF (GAUZE/BANDAGES/DRESSINGS) ×1 IMPLANT
TOWEL OR 17X26 10 PK STRL BLUE (TOWEL DISPOSABLE) ×2 IMPLANT
TUBE ANAEROBIC SPECIMEN COL (MISCELLANEOUS) IMPLANT
UNDERPAD 30X30 INCONTINENT (UNDERPADS AND DIAPERS) ×2 IMPLANT
WOUND MATRIX MESHED 2X2 (Tissue) ×1 IMPLANT

## 2014-08-28 NOTE — Brief Op Note (Addendum)
08/28/2014  2:39 PM  PATIENT:  [name] [name]  49 y.o. male  PRE-OPERATIVE DIAGNOSIS:  Right Groin Ulcer  POST-OPERATIVE DIAGNOSIS:  Right Groin Ulcer  PROCEDURE:  Procedure(s): RIGHT GROIN DEBRIDEMENT WITH INTEGRA PLACEMENT (Right)  SURGEON:  Surgeon(s) and Role:    * Claire Sanger, DO - Primary  PHYSICIAN ASSISTANT: Shawn Rayburn, PA  ASSISTANTS: none   ANESTHESIA:   general  EBL:     BLOOD ADMINISTERED:none  DRAINS: none   LOCAL MEDICATIONS USED:  NONE  SPECIMEN:  Source of Specimen:  right groin and right pelvic bone  DISPOSITION OF SPECIMEN:  micro  COUNTS:  YES  TOURNIQUET:  * No tourniquets in log *  DICTATION: .Dragon Dictation  PLAN OF CARE: Discharge to home after PACU  PATIENT DISPOSITION:  PACU - hemodynamically stable.   Delay start of Pharmacological VTE agent (>24hrs) due to surgical blood loss or risk of bleeding: no

## 2014-08-28 NOTE — Progress Notes (Signed)
Call to Dr. Lissa Hoard, informed that the peripheral IV attempt was unsuccessful. Calls to CRNA's - not available.

## 2014-08-28 NOTE — Anesthesia Postprocedure Evaluation (Signed)
Anesthesia Post Note  Patient: [name] [name]  Procedure(s) Performed: Procedure(s) (LRB): RIGHT GROIN DEBRIDEMENT WITH INTEGRA PLACEMENT (Right)  Anesthesia type: General  Patient location: PACU  Post pain: Pain level controlled  Post assessment: Post-op Vital signs reviewed  Last Vitals: BP 99/58 mmHg  Pulse 94  Temp(Src) 37 C (Oral)  Resp 14  Ht 5\' 11"  (1.803 m)  Wt 219 lb (99.338 kg)  BMI 30.56 kg/m2  SpO2 98%  Post vital signs: Reviewed  Level of consciousness: sedated  Complications: No apparent anesthesia complications

## 2014-08-28 NOTE — Anesthesia Procedure Notes (Signed)
Procedure Name: LMA Insertion Date/Time: 08/28/2014 1:57 PM Performed by: Merrilyn Puma B Pre-anesthesia Checklist: Patient identified, Emergency Drugs available, Suction available, Patient being monitored and Timeout performed Patient Re-evaluated:Patient Re-evaluated prior to inductionOxygen Delivery Method: Circle system utilized Preoxygenation: Pre-oxygenation with 100% oxygen Intubation Type: IV induction Ventilation: Mask ventilation without difficulty LMA: LMA inserted LMA Size: 5.0 Number of attempts: 1 Placement Confirmation: positive ETCO2 Tube secured with: Tape Dental Injury: Teeth and Oropharynx as per pre-operative assessment

## 2014-08-28 NOTE — Anesthesia Preprocedure Evaluation (Addendum)
Anesthesia Evaluation  Patient identified by MRN, date of birth, ID band Patient awake    Reviewed: Allergy & Precautions, NPO status , Patient's Chart, lab work & pertinent test results  Airway Mallampati: III  TM Distance: >3 FB Neck ROM: Full    Dental no notable dental hx.    Pulmonary sleep apnea , pneumonia -,  breath sounds clear to auscultation  Pulmonary exam normal       Cardiovascular hypertension, Pt. on medications + Peripheral Vascular Disease negative cardio ROS  Rhythm:Regular Rate:Normal     Neuro/Psych Seizures -,  PSYCHIATRIC DISORDERS Depression    GI/Hepatic Neg liver ROS, GERD-  Medicated,  Endo/Other  negative endocrine ROS  Renal/GU negative Renal ROS     Musculoskeletal negative musculoskeletal ROS (+)   Abdominal   Peds  Hematology negative hematology ROS (+) anemia ,   Anesthesia Other Findings   Reproductive/Obstetrics                            Anesthesia Physical Anesthesia Plan  ASA: III  Anesthesia Plan: General   Post-op Pain Management:    Induction: Intravenous  Airway Management Planned: LMA  Additional Equipment: None  Intra-op Plan:   Post-operative Plan: Extubation in OR  Informed Consent: I have reviewed the patients History and Physical, chart, labs and discussed the procedure including the risks, benefits and alternatives for the proposed anesthesia with the patient or authorized representative who has indicated his/her understanding and acceptance.   Dental advisory given  Plan Discussed with: CRNA  Anesthesia Plan Comments: (Very difficult IV stick. U/S left AC IV obtained by Leopoldo Mazzie. Could likely stick blind given the size of the vein.)       Anesthesia Quick Evaluation

## 2014-08-28 NOTE — Op Note (Signed)
Operative Note   DATE OF OPERATION: 08/28/2014  LOCATION: [facility] outpatient  SURGICAL DIVISION: Plastic Surgery  PREOPERATIVE DIAGNOSES:  Right groin ulcer ulcers   POSTOPERATIVE DIAGNOSES:  same  PROCEDURE:  Preparation of right groin ulcer 6 x 6 cm for placement of Integra (5 x 5 cm sheet) with debridement soft tissue, skin, muscle and bone.  SURGEON: [name], DO  ASSISTANT: Shawn Rayburn, PA  ANESTHESIA:  General.   COMPLICATIONS: None.   INDICATIONS FOR PROCEDURE:  The patient, [name] [name] is a 49 y.o. male born on 1966/04/15, is here for treatment of a right groin ulcer. MRN: [id number]  CONSENT:  Informed consent was obtained directly from the patient. Risks, benefits and alternatives were fully discussed. Specific risks including but not limited to bleeding, infection, hematoma, seroma, scarring, pain, infection, contracture, asymmetry, wound healing problems, and need for further surgery were all discussed. The patient did have an ample opportunity to have questions answered to satisfaction.   DESCRIPTION OF PROCEDURE:  The patient was taken to the operating room. The patient's operative site was prepped and draped in a sterile fashion. A time out was performed and all information was confirmed to be correct.  General anesthesia was administered.  The right groin was debrided with the #10 blade of nonviable skin, subcutaneous tissue and muscle.  There was exposed bone noted and this was debrided with a ronguer.  Gram stain, culture and sensitivities were sent.  The area was irrigated with antibiotic solution.  Hemostasis was achieved with electrocautery. The entire integra sheet was applied to the area and secured with 5-0 vicryl. Gauze was applied and an ABD.   The patient tolerated the procedure well.  There were no complications. The patient was allowed to wake from anesthesia, extubated and taken to the recovery room in satisfactory condition.
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
## 2014-08-28 NOTE — Transfer of Care (Signed)
Immediate Anesthesia Transfer of Care Note  Patient: [name] [name]  Procedure(s) Performed: Procedure(s): RIGHT GROIN DEBRIDEMENT WITH INTEGRA PLACEMENT (Right)  Patient Location: PACU  Anesthesia Type:General  Level of Consciousness: awake and alert   Airway & Oxygen Therapy: Patient Spontanous Breathing and Patient connected to nasal cannula oxygen  Post-op Assessment: Report given to RN, Post -op Vital signs reviewed and stable and Patient moving all extremities X 4  Post vital signs: Reviewed and stable  Last Vitals:  Filed Vitals:   08/28/14 1102  BP: 84/53  Pulse: 94  Temp: 36.6 C  Resp: 18  BP 115/77, HR 84, Sats 100% on 2LNC, rr 14  Complications: No apparent anesthesia complications

## 2014-08-28 NOTE — Discharge Instructions (Addendum)
Wet to dry dressings to the right groin daily.  Triple antibiotic ointment to the scrotum daily.

## 2014-08-28 NOTE — Progress Notes (Signed)
Pt d/c cancelled per Dr Lissa Hoard because  Patient received  general anesthesia today and uses public transportation. Patient has to use public transportation [facility] or city bus due to his disability and size of wheelchair. Dr Leafy Ro PA notified and orders received to admit patient overnight and patient may leave first thing in AM via public transportation. [facility] notified and pt to have pick-up between 9:15-9:45 ( [phone])

## 2014-08-29 ENCOUNTER — Telehealth: Payer: Self-pay | Admitting: Hematology

## 2014-08-29 NOTE — Telephone Encounter (Signed)
new patient app-s/w patient and gave np appt for 05/24 @ 10:30 w/Dr. Burr Medico Referring donna odem, np  Dx- anemia

## 2014-08-29 NOTE — Progress Notes (Signed)
Discussed discharge summary with patient. Reviewed all medications with patient. Patient ready for discharge and is waiting in lobby for [facility] transport home.

## 2014-08-29 NOTE — Telephone Encounter (Signed)
NEW PATIENT APPT-LEFT MESSAGE FOR PATIENT TO RETURN CALL TO SCHEDULE NP APPT

## 2014-08-30 LAB — WOUND CULTURE: GRAM STAIN: NONE SEEN

## 2014-08-31 LAB — TISSUE CULTURE

## 2014-08-31 IMAGING — CR DG LUMBAR SPINE 1V
1 series · 1 of 1 positions shown · non-contrast
Comparison: 12/20/2011.

CLINICAL DATA: Decubitus ulcer.

LUMBAR SPINE - 1 VIEW

[AP]
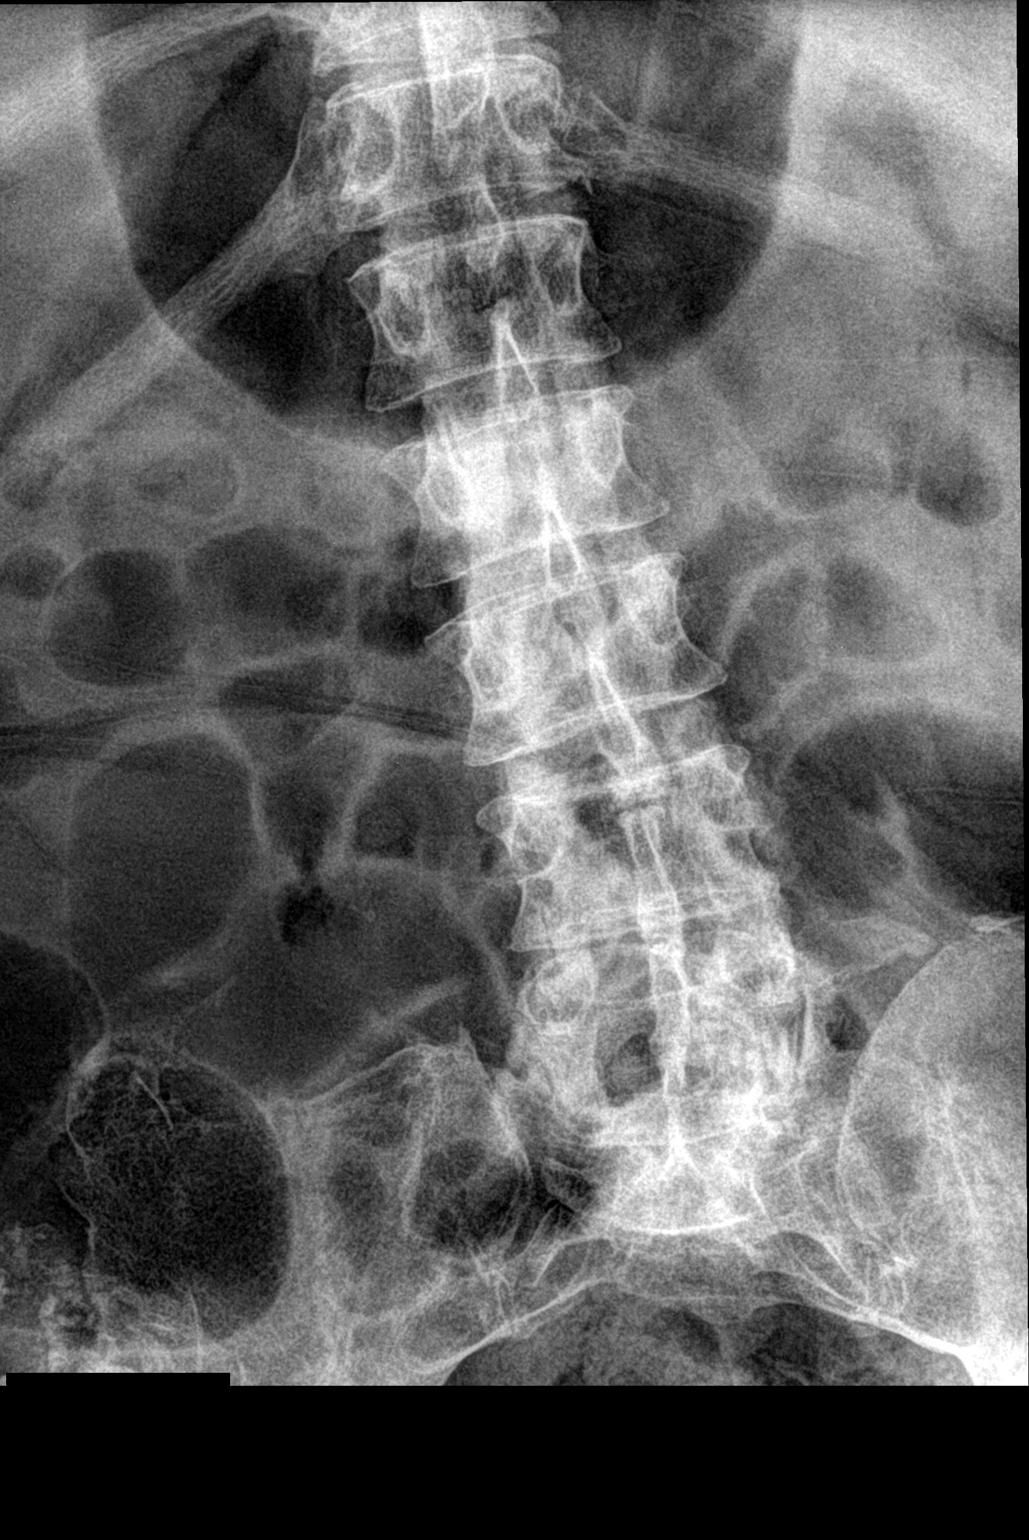

[1 of 1 positions shown; findings below may reference images not displayed]

FINDINGS: Prominent amount of stool projects over the pelvis.  This
limits detection of osseous injury.  The full extent of the sacrum
is not included on present examination.  If clinically desired, CT
imaging or MR would prove helpful.

Destruction of the right hip seen at the edge of the field of view.
IMPRESSION: Limited evaluation for detection of a sacral decubitus ulcer and
associated osteomyelitis.  CT or MR Carbol be considered for further
delineation.

## 2014-08-31 IMAGING — CR DG KNEE 1-2V PORT*L*
1 series · 2 of 2 positions shown · non-contrast
Comparison: None.

CLINICAL DATA: Left knee wound

PORTABLE LEFT KNEE - 1-2 VIEW

[Series 1: AP · left · 2 of 2 slices shown]
[im 1/2]
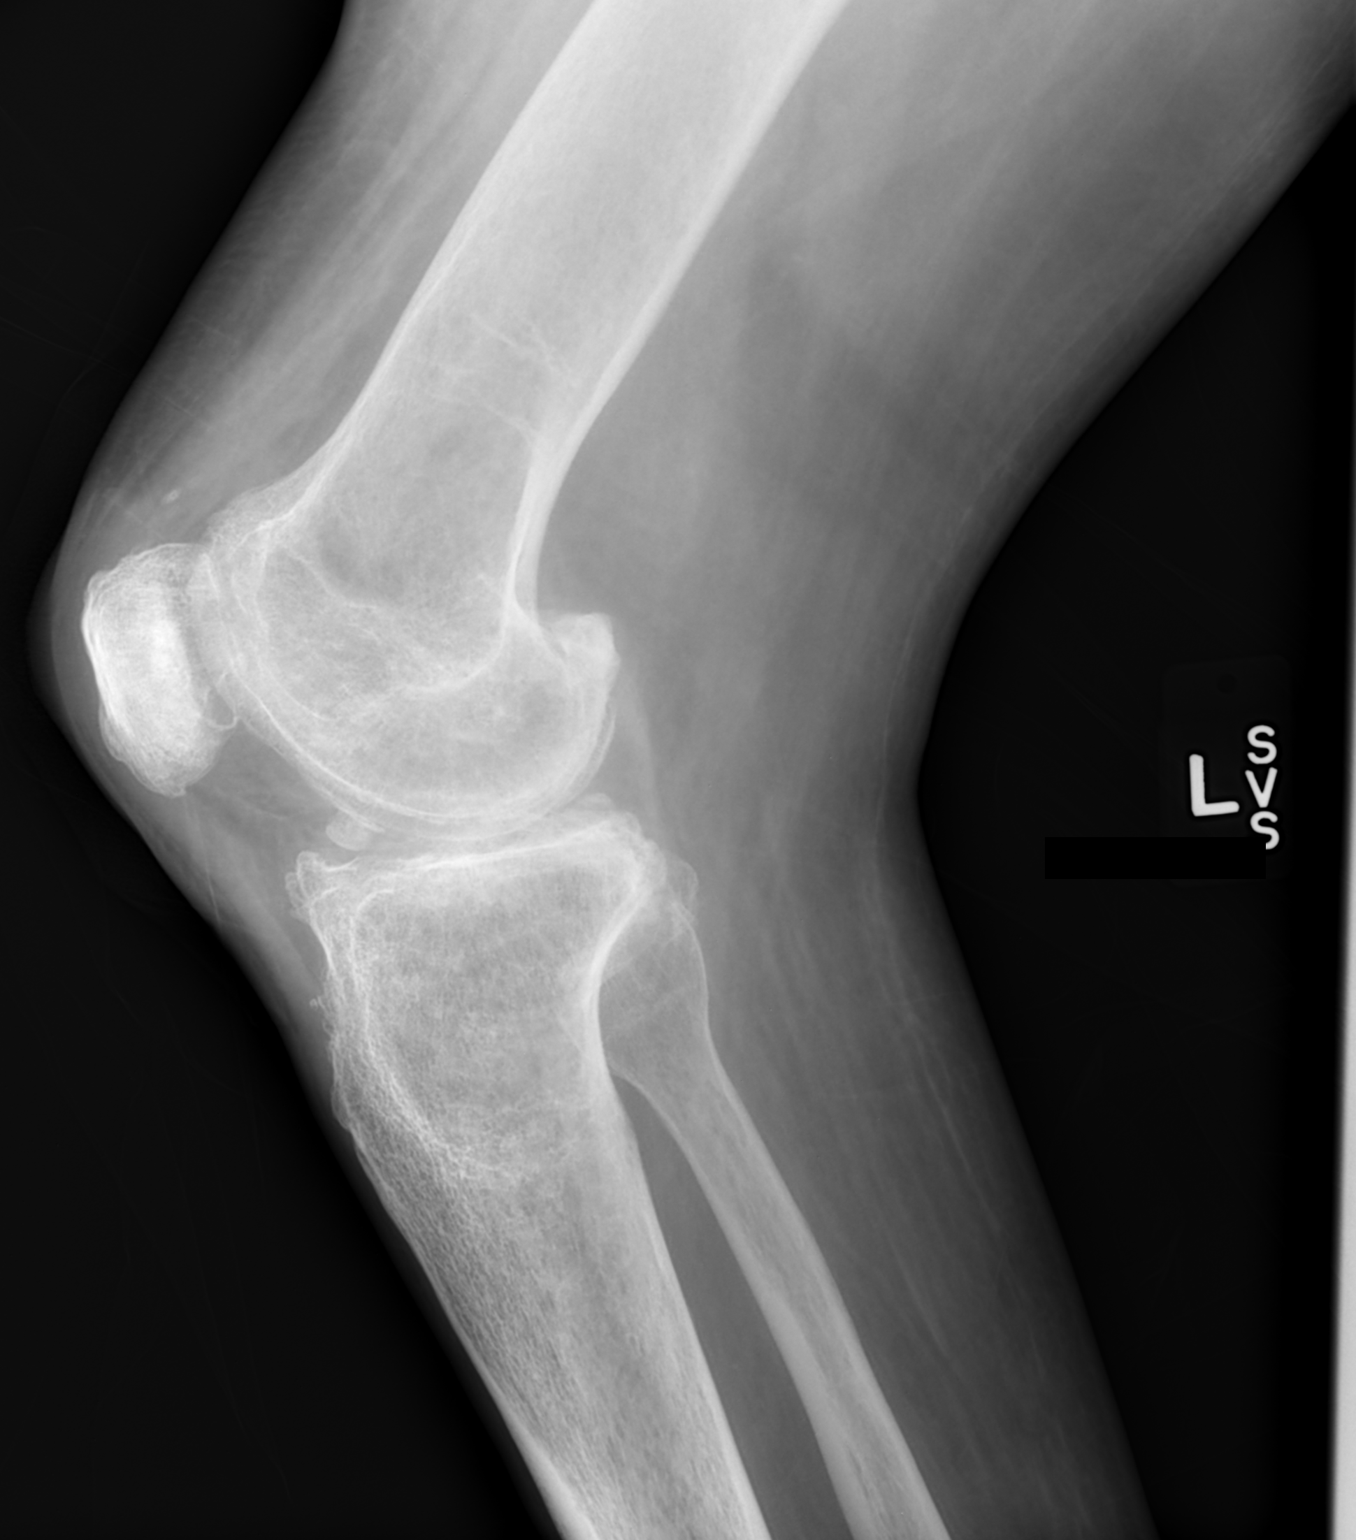
[im 2/2]
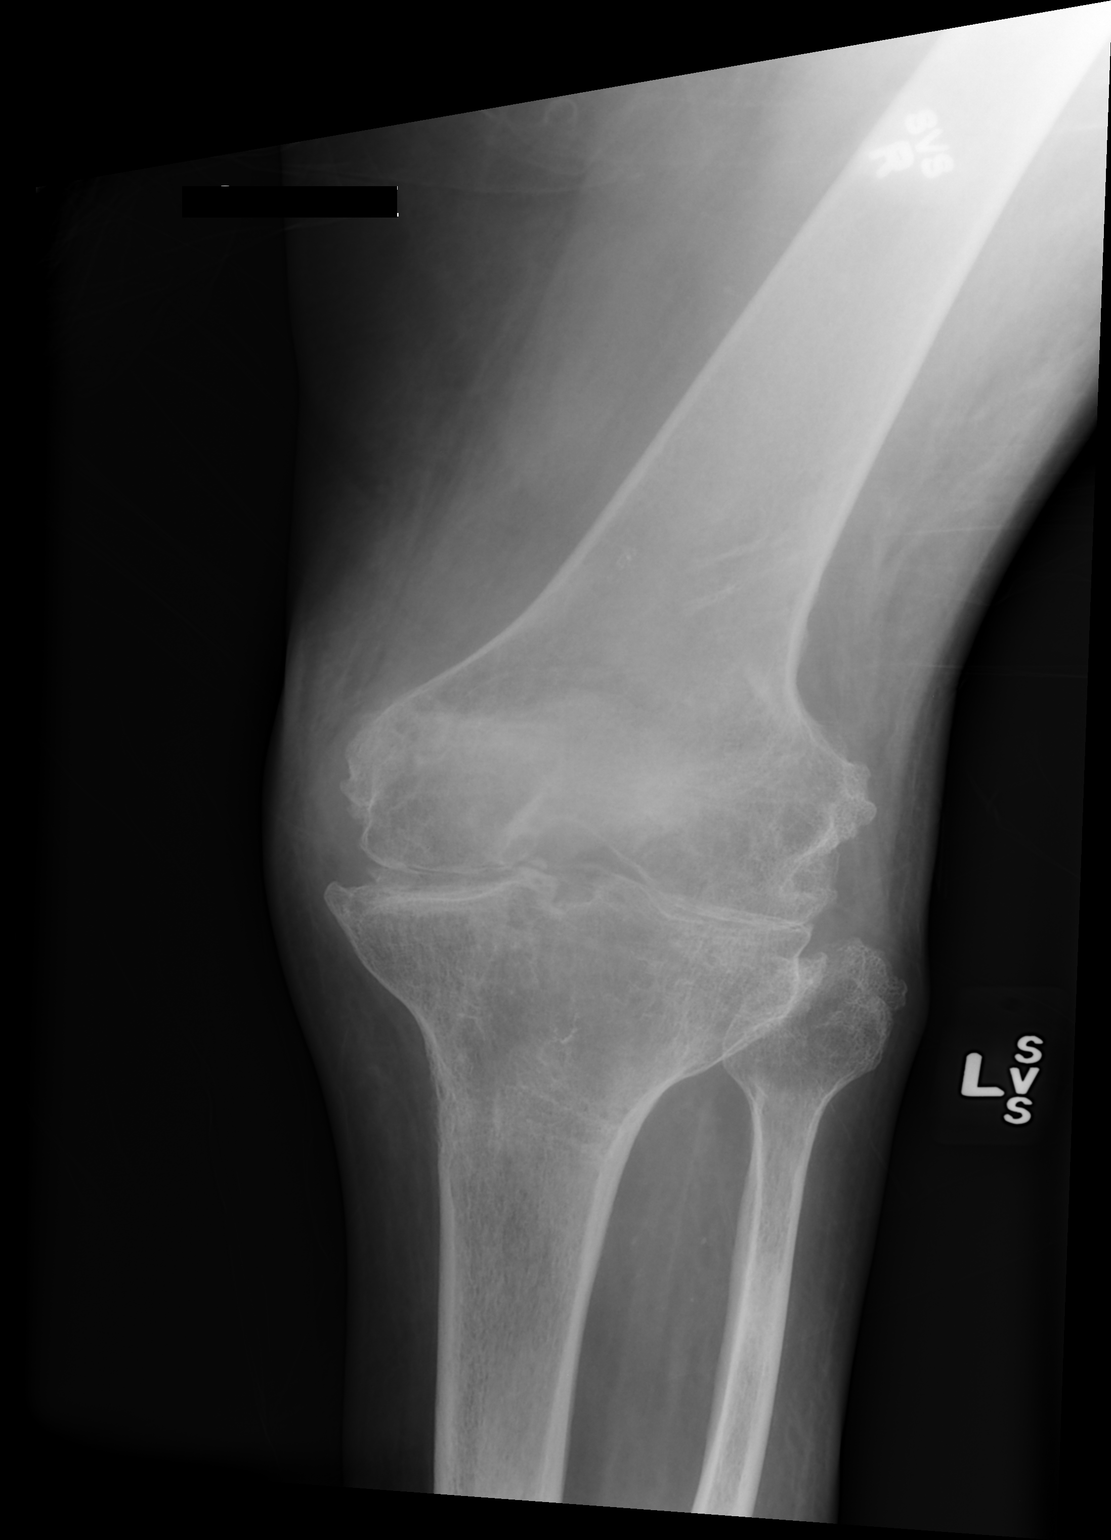

[2 of 2 positions shown; findings below may reference images not displayed]

FINDINGS: There is severe joint space narrowing of the medial and
lateral compartments with essentially bone on bone positioning.
There is severe osteophytosis of the medial and lateral
compartment.  There is no significant suprapatellar joint effusion.
No subcutaneous gas.  No clear evidence of osseous erosion.
IMPRESSION: 1..  Severe joint space narrowing of the medial and lateral
compartments suggests severe osteoarthritis.
2.  No clear evidence of bone infection or soft tissue infection.

## 2014-09-01 ENCOUNTER — Encounter (HOSPITAL_COMMUNITY): Payer: Self-pay | Admitting: Plastic Surgery

## 2014-09-01 ENCOUNTER — Encounter (HOSPITAL_BASED_OUTPATIENT_CLINIC_OR_DEPARTMENT_OTHER): Payer: Medicare Other | Attending: Plastic Surgery

## 2014-09-01 DIAGNOSIS — L89894 Pressure ulcer of other site, stage 4: Secondary | ICD-10-CM | POA: Insufficient documentation

## 2014-09-01 DIAGNOSIS — L89154 Pressure ulcer of sacral region, stage 4: Secondary | ICD-10-CM | POA: Diagnosis present

## 2014-09-01 DIAGNOSIS — L89153 Pressure ulcer of sacral region, stage 3: Secondary | ICD-10-CM | POA: Insufficient documentation

## 2014-09-01 DIAGNOSIS — L89113 Pressure ulcer of right upper back, stage 3: Secondary | ICD-10-CM | POA: Diagnosis not present

## 2014-09-01 DIAGNOSIS — L89893 Pressure ulcer of other site, stage 3: Secondary | ICD-10-CM | POA: Diagnosis not present

## 2014-09-01 DIAGNOSIS — L89892 Pressure ulcer of other site, stage 2: Secondary | ICD-10-CM | POA: Diagnosis not present

## 2014-09-01 IMAGING — CT CT PELVIS W/ CM
2 of 4 series · 16 of 46 positions shown, 18 images · IV contrast (OMNIPAQUE)
Comparison: None.

CLINICAL DATA: Multiple sacral decubitus ulcers with infection.
Evaluate for undrained abscess or evidence of osteomyelitis

CT PELVIS WITH CONTRAST
TECHNIQUE: Multidetector CT imaging of the pelvis was performed
using the standard protocol following the bolus administration of
intravenous contrast.
Contrast: 100mL OMNIPAQUE IOHEXOL 300 MG/ML  SOLN

[Series 2: pelvis st · axial · 0.86mm/px · z∈[-206,+68]mm · 13 of 61 slices shown, 15 images]
[im 3/61  soft-tissue]
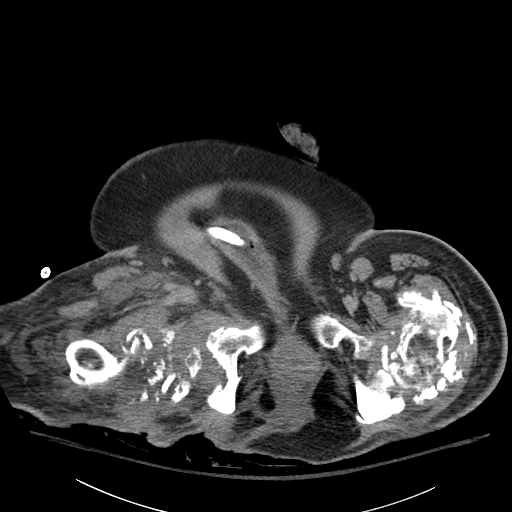
[im 3/61  bone]
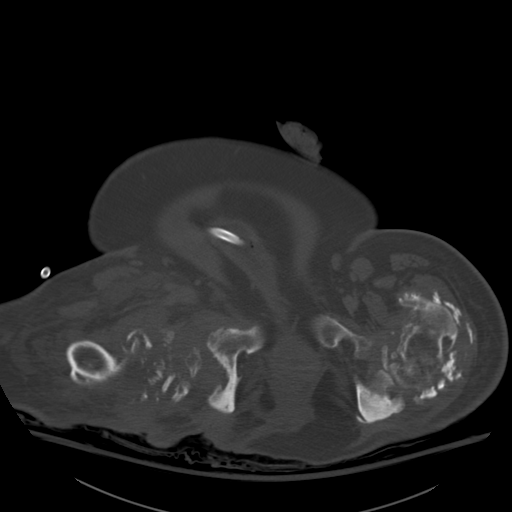
[im 8/61  soft-tissue]
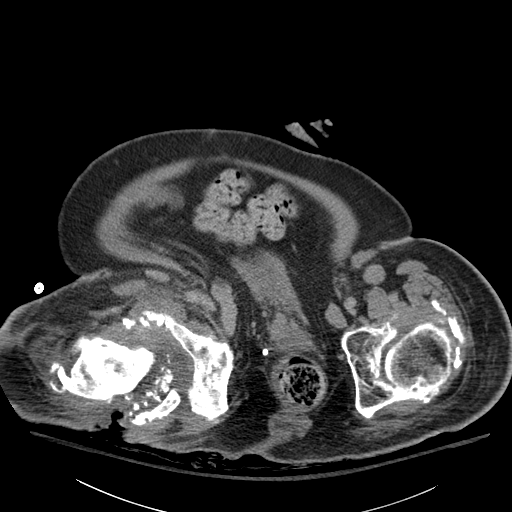
[im 13/61  soft-tissue]
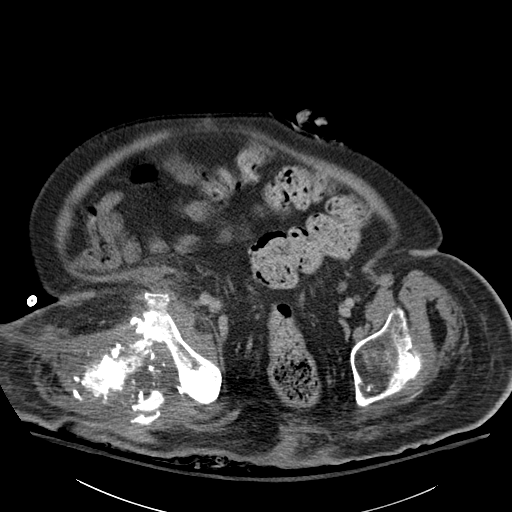
[im 18/61  soft-tissue]
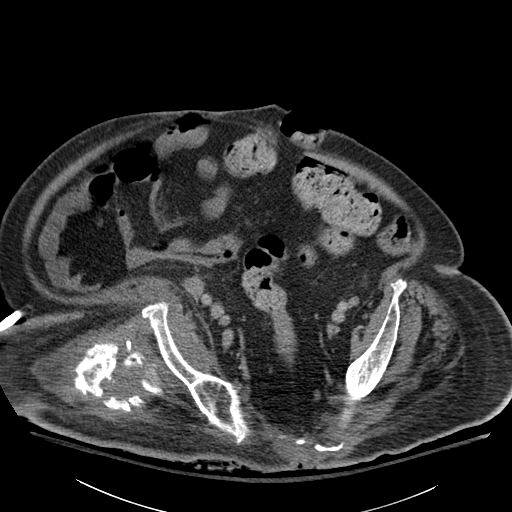
[im 21/61  soft-tissue]
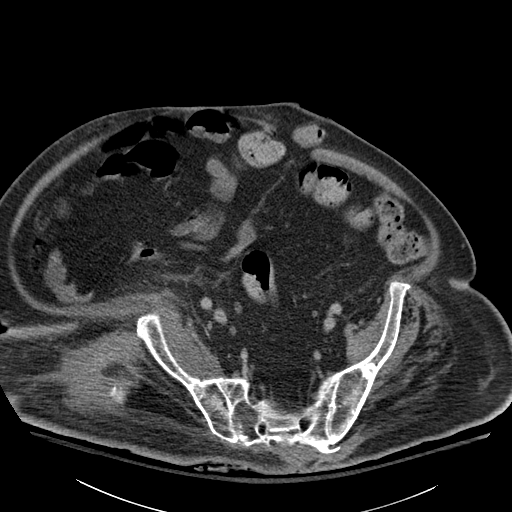
[im 26/61  soft-tissue]
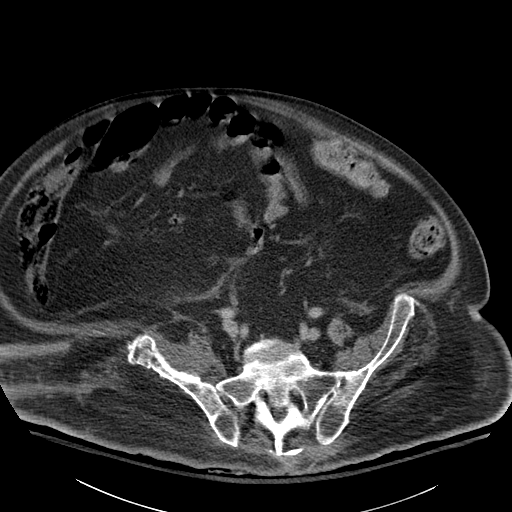
[im 31/61  soft-tissue]
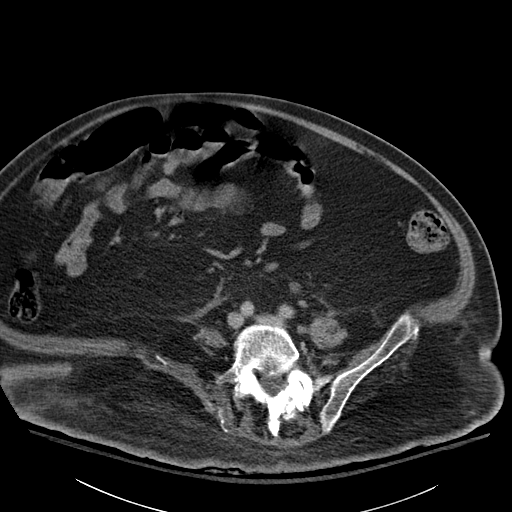
[im 36/61  soft-tissue]
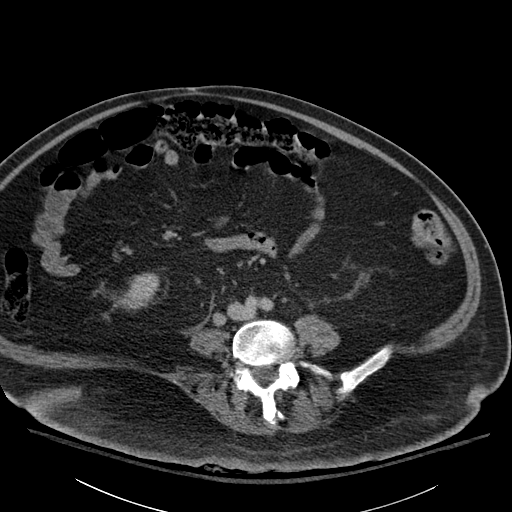
[im 41/61  soft-tissue]
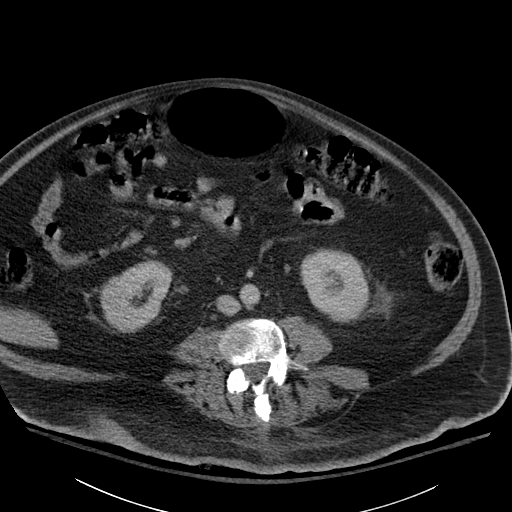
[im 41/61  bone]
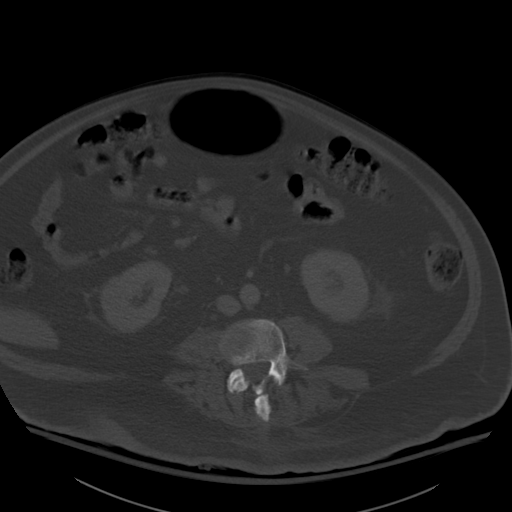
[im 43/61  soft-tissue]
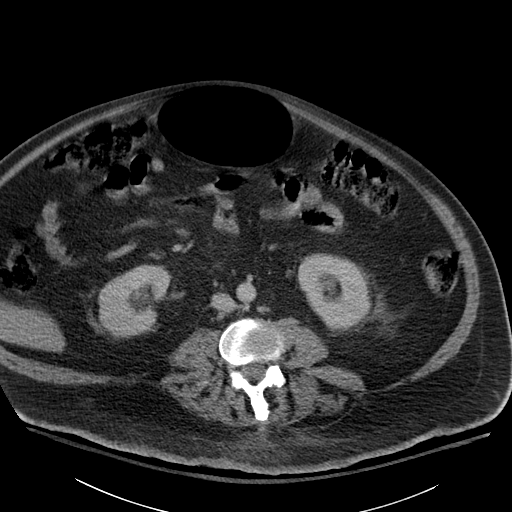
[im 48/61  soft-tissue]
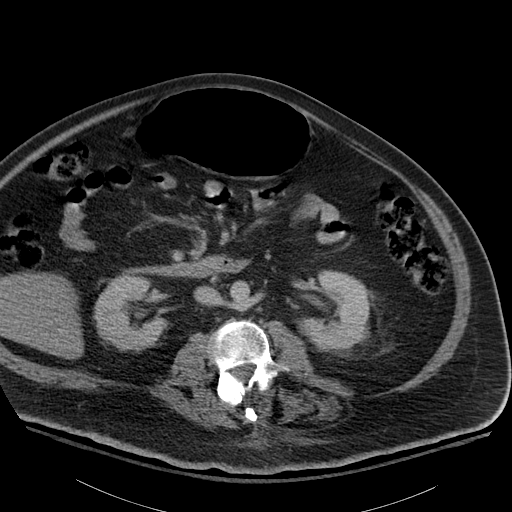
[im 53/61  soft-tissue]
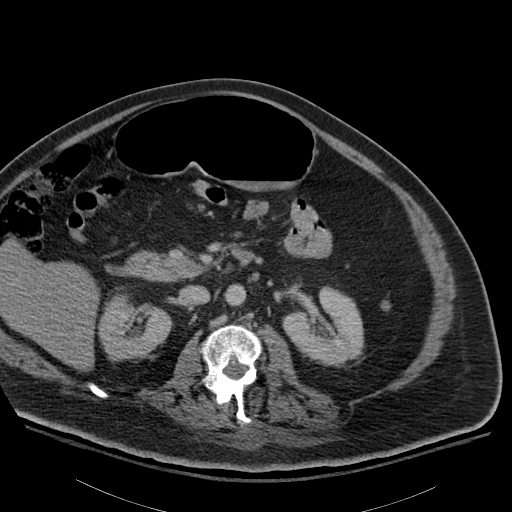
[im 58/61  soft-tissue]
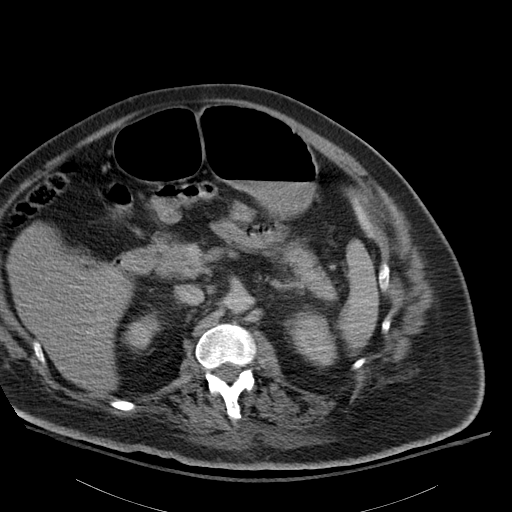

[Series 602: cor · coronal · 0.86mm/px · 3 of 168 slices shown]
[im 56/168  soft-tissue]
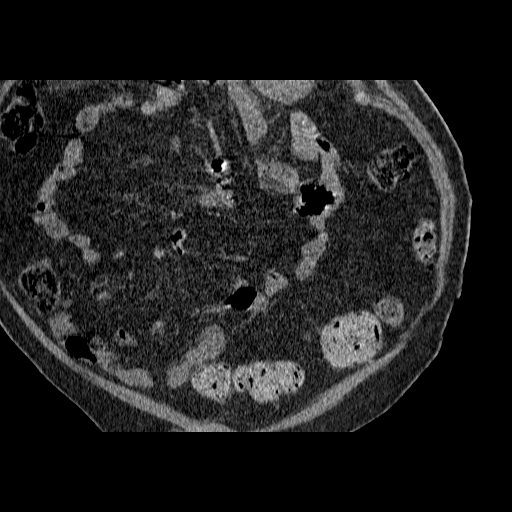
[im 75/168  soft-tissue]
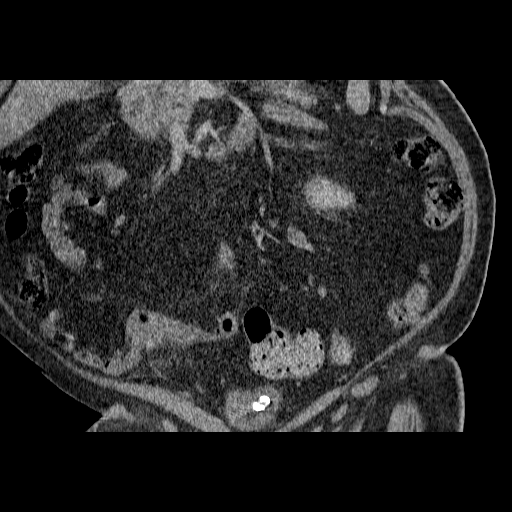
[im 93/168  soft-tissue]
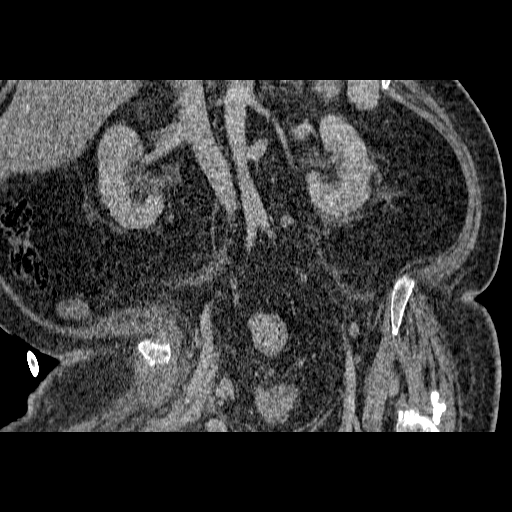

[16 of 46 positions shown; findings below may reference images not displayed]

FINDINGS: There is prominent fascial laxity of the lower abdominal
wall.  Incompletely visualized, a suprapubic tube is visualized in
situ..  The patient has a sigmoid end colostomy with a long
Hartmann's pouch.  Borderline lymphadenopathy is seen in long both
pelvic sidewalls.

Both hips are markedly dysmorphic with chronic dislocation noted on
the right. A posterior decubitus ulcer on the right tracks deep to
the level of the dysmorphic right femoral head.  The gas filled
tract can be followed to within 2 mm of the cortical bone which
appears to have some cortical thickening and irregularity at this
level.  As such, imaging features must be considered suspicious for
osteomyelitis.

There is a 3.2 x 3.6 cm fluid collection inferomedial to the
dysmorphic right femoral head.  Multiple bone/ossific fragments are
seen in the same region.

There is skin irregularity and thinning of the subcutaneous fat
overlying both ischial tuberosities.  Overall, there is prominent
sclerosis of both ischial tuberosities and this sclerotic change
extends up into the innominate bone of the right hip.  There is no
evidence for focal fluid collection in the region of either ischial
tuberosity or the left hip.
IMPRESSION: Prominent sclerotic change in both ischial tuberosities deep to
soft tissue changes consistent with decubitus ulcers. While bony
mineralization in the anatomic pelvis is diffusely heterogeneous,
this level of sclerosis would be consistent with age indeterminate
bony infection.

The dysmorphic, chronically dislocated right hip has a gas filled
tract which extends from the posterior skin deep to the level of
the dysmorphic femoral head.  The gas can be seen tracking to
within 2 mm of the cortical bone which shows some underlying focal
sclerosis and irregularity.  Bony infection at this level is
considered likely.

3.2 x 3.6 cm low attenuating apparent fluid collection is seen just
inferomedial to the dysmorphic right femoral head. Although there
is no rim enhancement with gas within the collection,
superinfection is not excluded by imaging.

## 2014-09-02 ENCOUNTER — Encounter (HOSPITAL_COMMUNITY): Payer: Self-pay | Admitting: Emergency Medicine

## 2014-09-02 ENCOUNTER — Emergency Department (HOSPITAL_COMMUNITY): Payer: Medicare Other

## 2014-09-02 ENCOUNTER — Inpatient Hospital Stay (HOSPITAL_COMMUNITY)
Admission: EM | Admit: 2014-09-02 | Discharge: 2014-09-08 | DRG: 602 | Disposition: A | Discharge status: Home Health | Payer: Medicare Other | Attending: Internal Medicine | Admitting: Internal Medicine

## 2014-09-02 DIAGNOSIS — J9610 Chronic respiratory failure, unspecified whether with hypoxia or hypercapnia: Secondary | ICD-10-CM | POA: Diagnosis present

## 2014-09-02 DIAGNOSIS — D509 Iron deficiency anemia, unspecified: Secondary | ICD-10-CM | POA: Diagnosis present

## 2014-09-02 DIAGNOSIS — L89103 Pressure ulcer of unspecified part of back, stage 3: Secondary | ICD-10-CM | POA: Diagnosis present

## 2014-09-02 DIAGNOSIS — E43 Unspecified severe protein-calorie malnutrition: Secondary | ICD-10-CM | POA: Diagnosis present

## 2014-09-02 DIAGNOSIS — M8660 Other chronic osteomyelitis, unspecified site: Secondary | ICD-10-CM | POA: Diagnosis present

## 2014-09-02 DIAGNOSIS — Z6828 Body mass index (BMI) 28.0-28.9, adult: Secondary | ICD-10-CM | POA: Diagnosis not present

## 2014-09-02 DIAGNOSIS — G4733 Obstructive sleep apnea (adult) (pediatric): Secondary | ICD-10-CM | POA: Diagnosis present

## 2014-09-02 DIAGNOSIS — M009 Pyogenic arthritis, unspecified: Secondary | ICD-10-CM | POA: Diagnosis present

## 2014-09-02 DIAGNOSIS — Z8701 Personal history of pneumonia (recurrent): Secondary | ICD-10-CM | POA: Diagnosis not present

## 2014-09-02 DIAGNOSIS — Z803 Family history of malignant neoplasm of breast: Secondary | ICD-10-CM

## 2014-09-02 DIAGNOSIS — G8250 Quadriplegia, unspecified: Secondary | ICD-10-CM | POA: Diagnosis present

## 2014-09-02 DIAGNOSIS — B965 Pseudomonas (aeruginosa) (mallei) (pseudomallei) as the cause of diseases classified elsewhere: Secondary | ICD-10-CM | POA: Diagnosis present

## 2014-09-02 DIAGNOSIS — L89159 Pressure ulcer of sacral region, unspecified stage: Secondary | ICD-10-CM | POA: Diagnosis present

## 2014-09-02 DIAGNOSIS — D649 Anemia, unspecified: Secondary | ICD-10-CM

## 2014-09-02 DIAGNOSIS — L89303 Pressure ulcer of unspecified buttock, stage 3: Secondary | ICD-10-CM | POA: Diagnosis present

## 2014-09-02 DIAGNOSIS — Z9359 Other cystostomy status: Secondary | ICD-10-CM | POA: Diagnosis not present

## 2014-09-02 DIAGNOSIS — G40909 Epilepsy, unspecified, not intractable, without status epilepticus: Secondary | ICD-10-CM | POA: Diagnosis present

## 2014-09-02 DIAGNOSIS — M899 Disorder of bone, unspecified: Secondary | ICD-10-CM | POA: Diagnosis present

## 2014-09-02 DIAGNOSIS — Z87440 Personal history of urinary (tract) infections: Secondary | ICD-10-CM | POA: Diagnosis not present

## 2014-09-02 DIAGNOSIS — Z888 Allergy status to other drugs, medicaments and biological substances status: Secondary | ICD-10-CM | POA: Diagnosis not present

## 2014-09-02 DIAGNOSIS — T798XXD Other early complications of trauma, subsequent encounter: Secondary | ICD-10-CM | POA: Diagnosis not present

## 2014-09-02 DIAGNOSIS — Z981 Arthrodesis status: Secondary | ICD-10-CM

## 2014-09-02 DIAGNOSIS — I10 Essential (primary) hypertension: Secondary | ICD-10-CM | POA: Diagnosis present

## 2014-09-02 DIAGNOSIS — M86659 Other chronic osteomyelitis, unspecified thigh: Secondary | ICD-10-CM | POA: Diagnosis not present

## 2014-09-02 DIAGNOSIS — R509 Fever, unspecified: Secondary | ICD-10-CM

## 2014-09-02 DIAGNOSIS — M869 Osteomyelitis, unspecified: Secondary | ICD-10-CM

## 2014-09-02 DIAGNOSIS — Z933 Colostomy status: Secondary | ICD-10-CM

## 2014-09-02 DIAGNOSIS — L0889 Other specified local infections of the skin and subcutaneous tissue: Secondary | ICD-10-CM | POA: Diagnosis not present

## 2014-09-02 DIAGNOSIS — T148XXA Other injury of unspecified body region, initial encounter: Secondary | ICD-10-CM

## 2014-09-02 DIAGNOSIS — D508 Other iron deficiency anemias: Secondary | ICD-10-CM

## 2014-09-02 DIAGNOSIS — K219 Gastro-esophageal reflux disease without esophagitis: Secondary | ICD-10-CM | POA: Diagnosis present

## 2014-09-02 DIAGNOSIS — N390 Urinary tract infection, site not specified: Secondary | ICD-10-CM | POA: Diagnosis present

## 2014-09-02 DIAGNOSIS — S31000A Unspecified open wound of lower back and pelvis without penetration into retroperitoneum, initial encounter: Secondary | ICD-10-CM

## 2014-09-02 DIAGNOSIS — Z79899 Other long term (current) drug therapy: Secondary | ICD-10-CM

## 2014-09-02 DIAGNOSIS — Z79891 Long term (current) use of opiate analgesic: Secondary | ICD-10-CM | POA: Diagnosis not present

## 2014-09-02 DIAGNOSIS — F329 Major depressive disorder, single episode, unspecified: Secondary | ICD-10-CM | POA: Diagnosis present

## 2014-09-02 DIAGNOSIS — M8610 Other acute osteomyelitis, unspecified site: Secondary | ICD-10-CM | POA: Insufficient documentation

## 2014-09-02 DIAGNOSIS — L89154 Pressure ulcer of sacral region, stage 4: Secondary | ICD-10-CM | POA: Diagnosis present

## 2014-09-02 DIAGNOSIS — R0600 Dyspnea, unspecified: Secondary | ICD-10-CM

## 2014-09-02 DIAGNOSIS — E669 Obesity, unspecified: Secondary | ICD-10-CM | POA: Diagnosis present

## 2014-09-02 DIAGNOSIS — Z8711 Personal history of peptic ulcer disease: Secondary | ICD-10-CM

## 2014-09-02 DIAGNOSIS — T798XXA Other early complications of trauma, initial encounter: Secondary | ICD-10-CM

## 2014-09-02 DIAGNOSIS — L089 Local infection of the skin and subcutaneous tissue, unspecified: Secondary | ICD-10-CM | POA: Diagnosis present

## 2014-09-02 DIAGNOSIS — D638 Anemia in other chronic diseases classified elsewhere: Secondary | ICD-10-CM | POA: Diagnosis present

## 2014-09-02 DIAGNOSIS — T8189XA Other complications of procedures, not elsewhere classified, initial encounter: Secondary | ICD-10-CM | POA: Diagnosis not present

## 2014-09-02 LAB — URINALYSIS, ROUTINE W REFLEX MICROSCOPIC
Bilirubin Urine: NEGATIVE
Glucose, UA: NEGATIVE mg/dL
Hgb urine dipstick: NEGATIVE
Ketones, ur: NEGATIVE mg/dL
Nitrite: POSITIVE — AB
PH: 6.5 (ref 5.0–8.0)
Protein, ur: NEGATIVE mg/dL
Specific Gravity, Urine: 1.013 (ref 1.005–1.030)
Urobilinogen, UA: 0.2 mg/dL (ref 0.0–1.0)

## 2014-09-02 LAB — CBC WITH DIFFERENTIAL/PLATELET
BASOS ABS: 0 10*3/uL (ref 0.0–0.1)
Basophils Relative: 0 % (ref 0–1)
Eosinophils Absolute: 0 10*3/uL (ref 0.0–0.7)
Eosinophils Relative: 0 % (ref 0–5)
HCT: 26.7 % — ABNORMAL LOW (ref 39.0–52.0)
Hemoglobin: 7.7 g/dL — ABNORMAL LOW (ref 13.0–17.0)
Lymphocytes Relative: 11 % — ABNORMAL LOW (ref 12–46)
Lymphs Abs: 2 10*3/uL (ref 0.7–4.0)
MCH: 20.4 pg — AB (ref 26.0–34.0)
MCHC: 28.8 g/dL — ABNORMAL LOW (ref 30.0–36.0)
MCV: 70.8 fL — ABNORMAL LOW (ref 78.0–100.0)
MONO ABS: 1.8 10*3/uL — AB (ref 0.1–1.0)
Monocytes Relative: 10 % (ref 3–12)
NEUTROS PCT: 79 % — AB (ref 43–77)
Neutro Abs: 14 10*3/uL — ABNORMAL HIGH (ref 1.7–7.7)
PLATELETS: 708 10*3/uL — AB (ref 150–400)
RBC: 3.77 MIL/uL — ABNORMAL LOW (ref 4.22–5.81)
RDW: 17.4 % — ABNORMAL HIGH (ref 11.5–15.5)
WBC: 17.8 10*3/uL — AB (ref 4.0–10.5)

## 2014-09-02 LAB — URINE MICROSCOPIC-ADD ON

## 2014-09-02 LAB — I-STAT CHEM 8, ED
BUN: 21 mg/dL (ref 6–23)
CALCIUM ION: 1.11 mmol/L — AB (ref 1.12–1.23)
Chloride: 101 mmol/L (ref 96–112)
Creatinine, Ser: 0.7 mg/dL (ref 0.50–1.35)
Glucose, Bld: 128 mg/dL — ABNORMAL HIGH (ref 70–99)
HEMATOCRIT: 30 % — AB (ref 39.0–52.0)
Hemoglobin: 10.2 g/dL — ABNORMAL LOW (ref 13.0–17.0)
Potassium: 4.7 mmol/L (ref 3.5–5.1)
Sodium: 136 mmol/L (ref 135–145)
TCO2: 25 mmol/L (ref 0–100)

## 2014-09-02 LAB — I-STAT CG4 LACTIC ACID, ED: LACTIC ACID, VENOUS: 0.99 mmol/L (ref 0.5–2.0)

## 2014-09-02 IMAGING — US US RENAL
1 series · 14 of 20 positions shown · non-contrast
Comparison: CT pelvis 01/18/2012.

CLINICAL DATA: Chronic cystitis.  Neurogenic bladder.

RENAL/URINARY TRACT ULTRASOUND COMPLETE

[Series 1: us renal · 0.43mm/px · 14 of 20 slices shown]
[im 1/20]
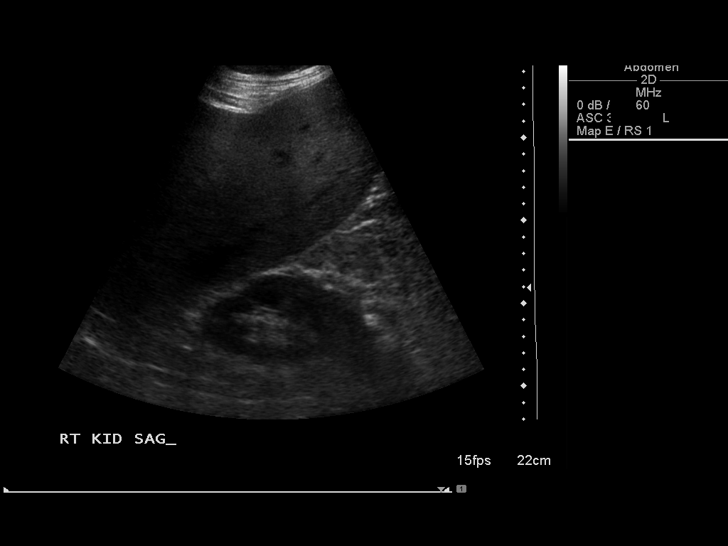
[im 3/20]
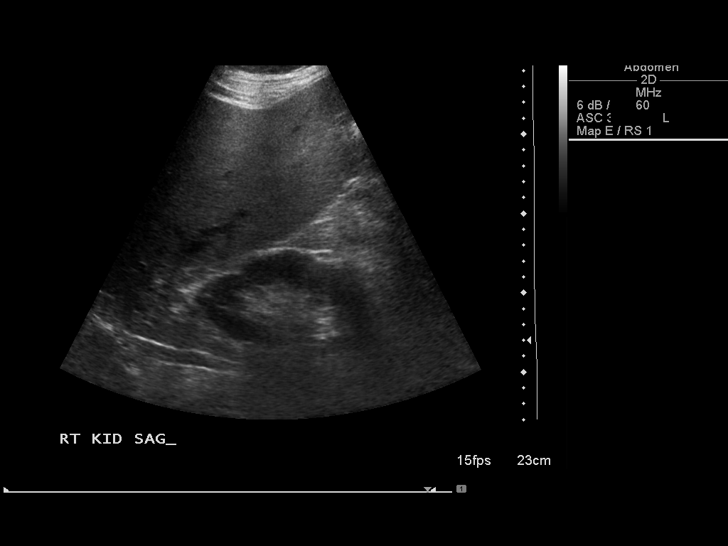
[im 4/20]
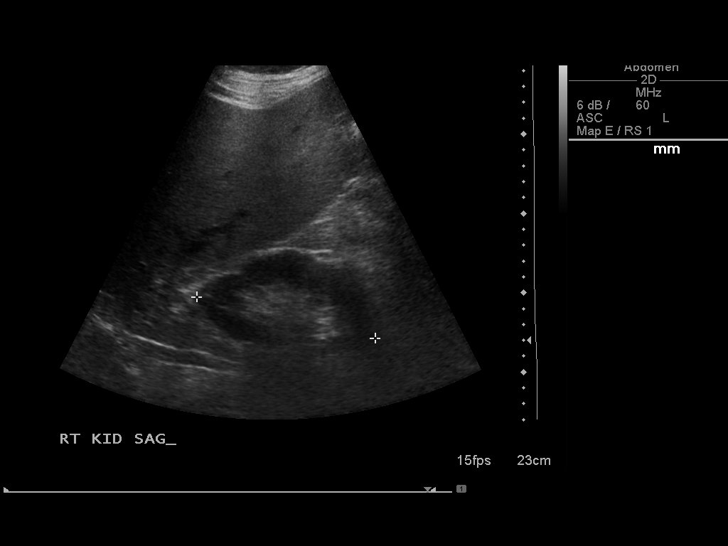
[im 6/20]
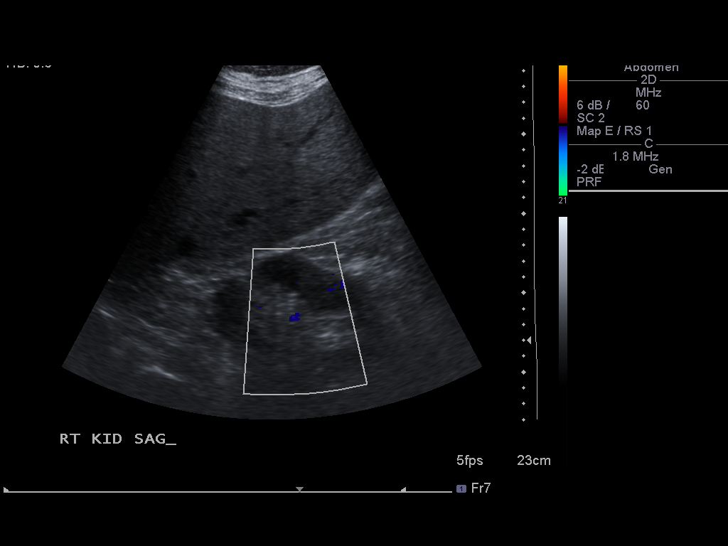
[im 7/20]
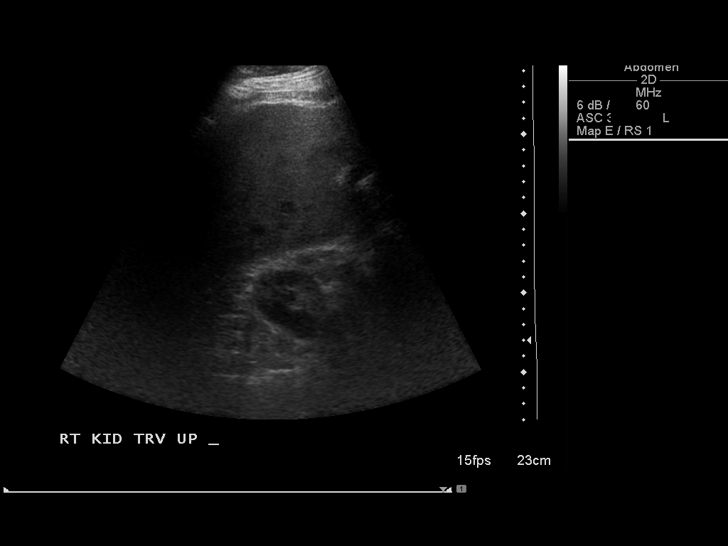
[im 8/20]
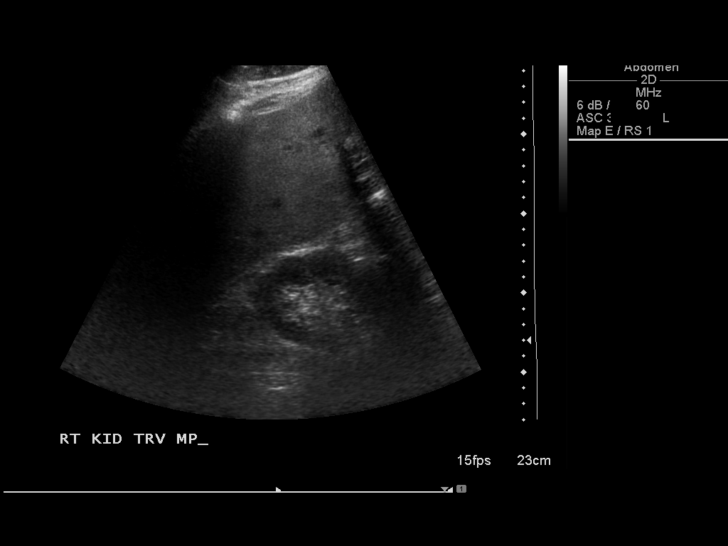
[im 10/20]
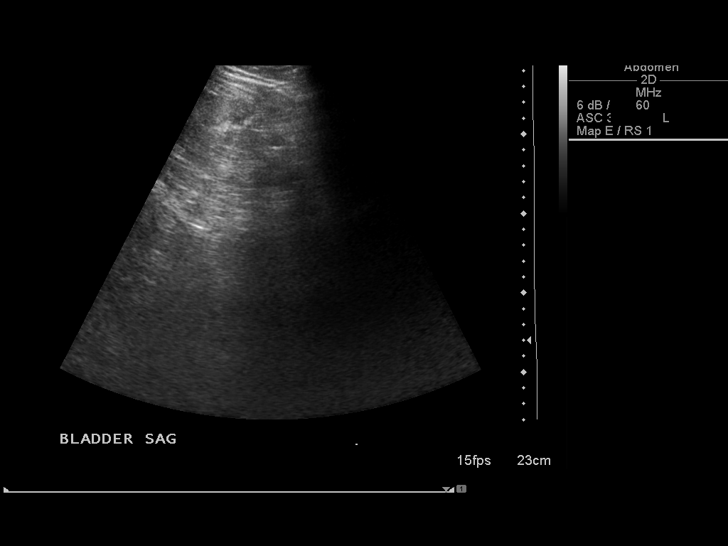
[im 11/20]
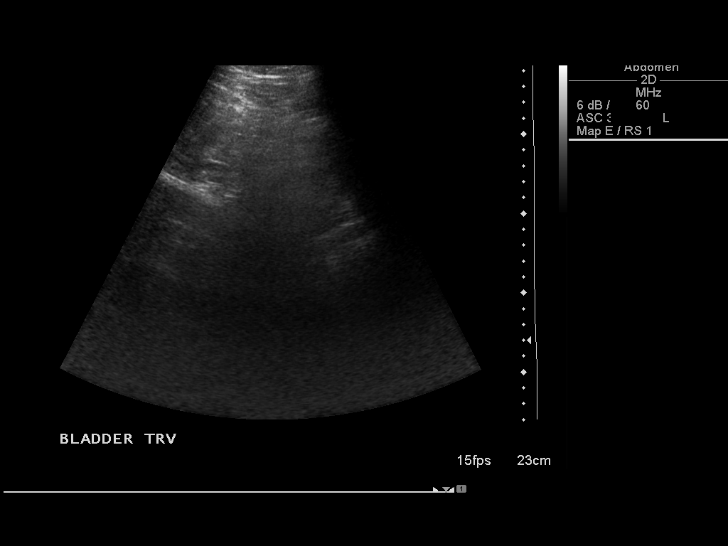
[im 13/20]
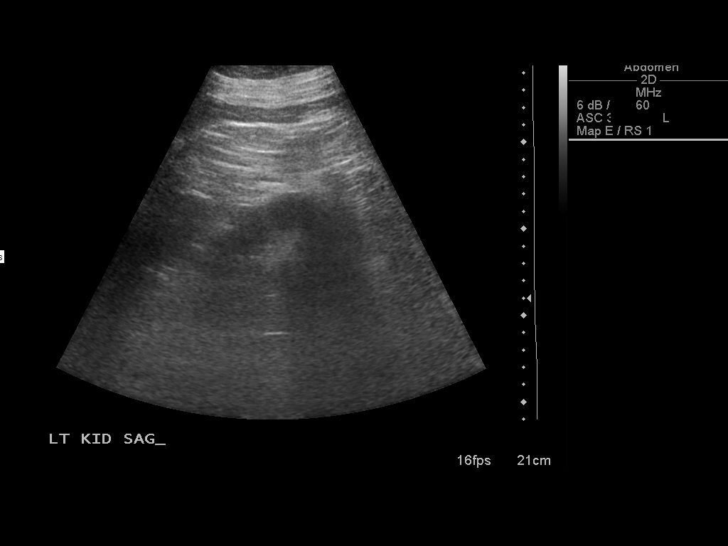
[im 14/20]
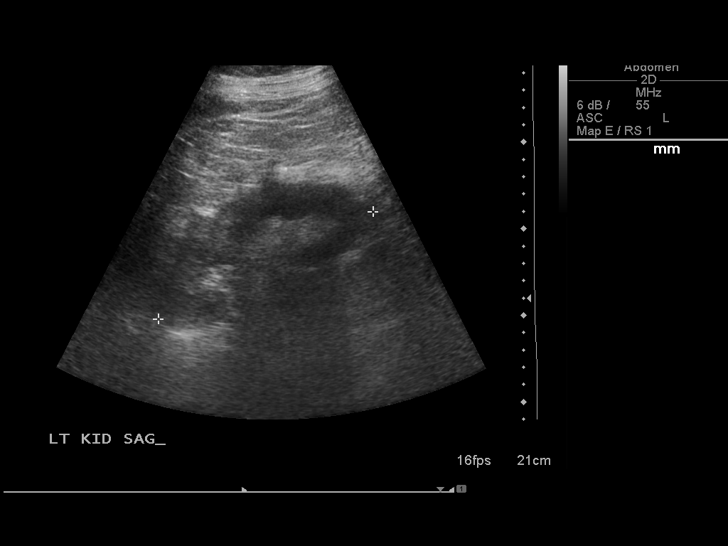
[im 16/20]
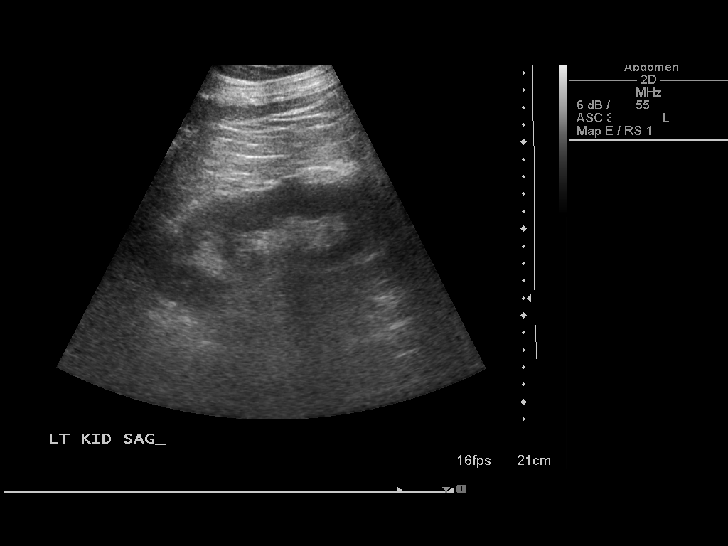
[im 17/20]
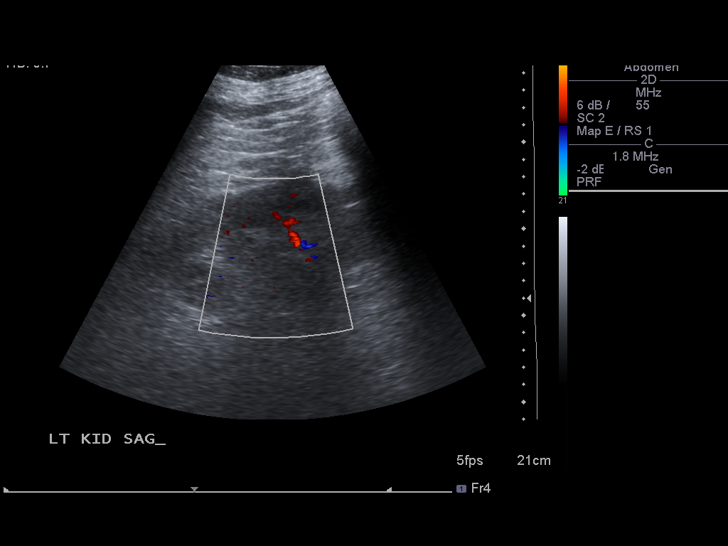
[im 18/20]
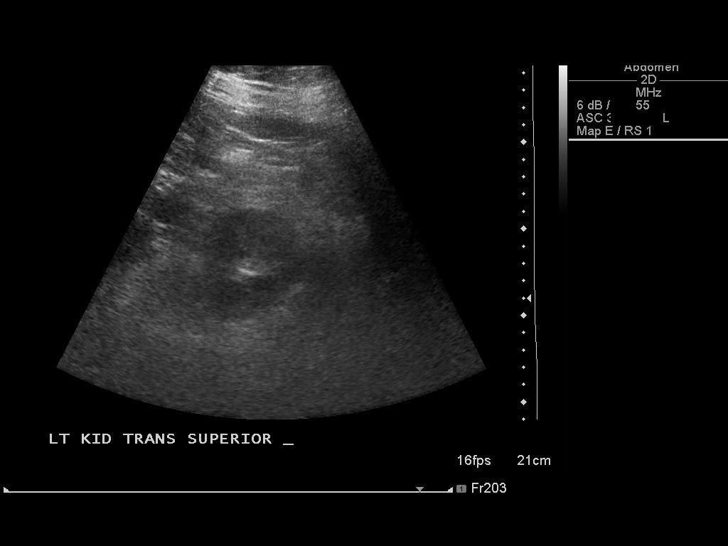
[im 20/20]
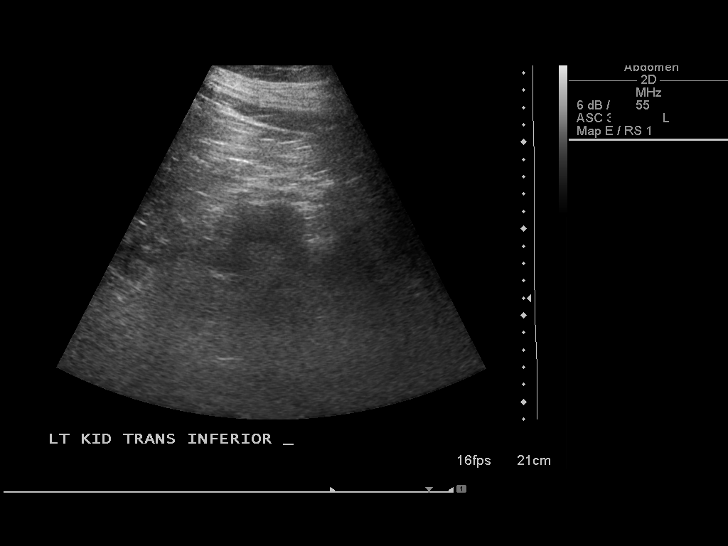

[14 of 20 positions shown; findings below may reference images not displayed]

FINDINGS: Right Kidney:  Measures 11.5 cm and appears normal without stone,
mass or hydronephrosis.

Left Kidney:  Measures 13.9 cm and appears normal without stone,
mass or hydronephrosis.

Bladder:  Completely decompressed with a suprapubic catheter in
place.
IMPRESSION: Normal-appearing kidneys.  Suprapubic catheter noted.

## 2014-09-02 MED ORDER — DARIFENACIN HYDROBROMIDE ER 15 MG PO TB24
15.0000 mg | ORAL_TABLET | Freq: Every day | ORAL | Status: DC
  Administered 2014-09-03 – 2014-09-08 (×6): 15 mg via ORAL
  Filled 2014-09-02 (×6): qty 1

## 2014-09-02 MED ORDER — BOOST PLUS PO LIQD
1.0000 | Freq: Three times a day (TID) | ORAL | Status: DC
  Administered 2014-09-03 – 2014-09-08 (×15): 237 mL via ORAL
  Filled 2014-09-02 (×18): qty 237

## 2014-09-02 MED ORDER — ONDANSETRON HCL 4 MG/2ML IJ SOLN: 4.0000 mg | Freq: Three times a day (TID) | INTRAMUSCULAR | Status: DC | PRN

## 2014-09-02 MED ORDER — PIPERACILLIN-TAZOBACTAM 3.375 G IVPB
3.3750 g | Freq: Once | INTRAVENOUS | Status: AC
  Administered 2014-09-03: 3.375 g via INTRAVENOUS
  Filled 2014-09-02: qty 50

## 2014-09-02 MED ORDER — HYDROMORPHONE HCL 1 MG/ML IJ SOLN
0.5000 mg | INTRAMUSCULAR | Status: DC | PRN
  Filled 2014-09-02: qty 1

## 2014-09-02 MED ORDER — IOHEXOL 300 MG/ML  SOLN
25.0000 mL | Freq: Once | INTRAMUSCULAR | Status: AC | PRN
  Administered 2014-09-02: 25 mL via ORAL

## 2014-09-02 MED ORDER — METOCLOPRAMIDE HCL 10 MG PO TABS
10.0000 mg | ORAL_TABLET | Freq: Three times a day (TID) | ORAL | Status: DC
  Administered 2014-09-03 – 2014-09-08 (×16): 10 mg via ORAL
  Filled 2014-09-02 (×19): qty 1

## 2014-09-02 MED ORDER — VANCOMYCIN HCL 10 G IV SOLR
1250.0000 mg | Freq: Once | INTRAVENOUS | Status: AC
  Administered 2014-09-03: 1250 mg via INTRAVENOUS
  Filled 2014-09-02: qty 1250

## 2014-09-02 MED ORDER — GI COCKTAIL ~~LOC~~
30.0000 mL | Freq: Once | ORAL | Status: AC
  Administered 2014-09-02: 30 mL via ORAL
  Filled 2014-09-02: qty 30

## 2014-09-02 MED ORDER — FAMOTIDINE 20 MG PO TABS
20.0000 mg | ORAL_TABLET | Freq: Two times a day (BID) | ORAL | Status: DC
  Administered 2014-09-03 – 2014-09-08 (×11): 20 mg via ORAL
  Filled 2014-09-02 (×13): qty 1

## 2014-09-02 MED ORDER — JUVEN PO PACK
1.0000 | PACK | Freq: Two times a day (BID) | ORAL | Status: DC
  Administered 2014-09-03 – 2014-09-08 (×11): 1 via ORAL
  Filled 2014-09-02 (×12): qty 1

## 2014-09-02 MED ORDER — POLYETHYLENE GLYCOL 3350 17 G PO PACK
17.0000 g | PACK | Freq: Every day | ORAL | Status: DC
  Administered 2014-09-03 – 2014-09-08 (×6): 17 g via ORAL
  Filled 2014-09-02 (×6): qty 1

## 2014-09-02 MED ORDER — ONDANSETRON HCL 4 MG PO TABS: 4.0000 mg | ORAL_TABLET | Freq: Four times a day (QID) | ORAL | Status: DC | PRN

## 2014-09-02 MED ORDER — ZINC SULFATE 220 (50 ZN) MG PO CAPS
220.0000 mg | ORAL_CAPSULE | Freq: Every morning | ORAL | Status: DC
  Administered 2014-09-03 – 2014-09-08 (×6): 220 mg via ORAL
  Filled 2014-09-02 (×6): qty 1

## 2014-09-02 MED ORDER — FLORANEX PO PACK
1.0000 g | PACK | Freq: Three times a day (TID) | ORAL | Status: DC
  Administered 2014-09-03 – 2014-09-08 (×11): 1 g via ORAL
  Filled 2014-09-02 (×21): qty 1

## 2014-09-02 MED ORDER — VITAMIN C 500 MG PO TABS
500.0000 mg | ORAL_TABLET | Freq: Every morning | ORAL | Status: DC
  Administered 2014-09-03 – 2014-09-08 (×6): 500 mg via ORAL
  Filled 2014-09-02 (×6): qty 1

## 2014-09-02 MED ORDER — OXYCODONE HCL 5 MG PO TABS: 10.0000 mg | ORAL_TABLET | Freq: Four times a day (QID) | ORAL | Status: DC | PRN

## 2014-09-02 MED ORDER — ENOXAPARIN SODIUM 40 MG/0.4ML ~~LOC~~ SOLN
40.0000 mg | Freq: Every day | SUBCUTANEOUS | Status: DC
  Administered 2014-09-03 – 2014-09-07 (×5): 40 mg via SUBCUTANEOUS
  Filled 2014-09-02 (×7): qty 0.4

## 2014-09-02 MED ORDER — ALUM & MAG HYDROXIDE-SIMETH 200-200-20 MG/5ML PO SUSP: 30.0000 mL | Freq: Four times a day (QID) | ORAL | Status: DC | PRN

## 2014-09-02 MED ORDER — DOCUSATE SODIUM 100 MG PO CAPS: 100.0000 mg | ORAL_CAPSULE | Freq: Two times a day (BID) | ORAL | Status: DC | PRN

## 2014-09-02 MED ORDER — ADULT MULTIVITAMIN W/MINERALS CH
1.0000 | ORAL_TABLET | Freq: Every morning | ORAL | Status: DC
  Administered 2014-09-03 – 2014-09-08 (×6): 1 via ORAL
  Filled 2014-09-02 (×6): qty 1

## 2014-09-02 MED ORDER — ACETAMINOPHEN 325 MG PO TABS
650.0000 mg | ORAL_TABLET | Freq: Four times a day (QID) | ORAL | Status: DC | PRN
  Administered 2014-09-03 – 2014-09-05 (×2): 650 mg via ORAL
  Filled 2014-09-02 (×2): qty 2

## 2014-09-02 MED ORDER — BACLOFEN 20 MG PO TABS
20.0000 mg | ORAL_TABLET | Freq: Four times a day (QID) | ORAL | Status: DC
  Administered 2014-09-03 – 2014-09-08 (×21): 20 mg via ORAL
  Filled 2014-09-02 (×25): qty 1

## 2014-09-02 MED ORDER — ONDANSETRON HCL 4 MG/2ML IJ SOLN
4.0000 mg | Freq: Four times a day (QID) | INTRAMUSCULAR | Status: DC | PRN
  Administered 2014-09-03: 4 mg via INTRAVENOUS
  Filled 2014-09-02: qty 2

## 2014-09-02 MED ORDER — ACETAMINOPHEN 650 MG RE SUPP: 650.0000 mg | Freq: Four times a day (QID) | RECTAL | Status: DC | PRN

## 2014-09-02 MED ORDER — SODIUM CHLORIDE 0.9 % IV SOLN: Freq: Once | INTRAVENOUS | Status: DC

## 2014-09-02 MED ORDER — SODIUM CHLORIDE 0.9 % IV SOLN
INTRAVENOUS | Status: DC
  Administered 2014-09-03 – 2014-09-04 (×2): 50 mL/h via INTRAVENOUS
  Administered 2014-09-04: 19:00:00 via INTRAVENOUS
  Administered 2014-09-04: 1000 mL via INTRAVENOUS
  Administered 2014-09-06 – 2014-09-08 (×4): via INTRAVENOUS

## 2014-09-02 MED ORDER — AMLODIPINE BESYLATE 2.5 MG PO TABS
2.5000 mg | ORAL_TABLET | Freq: Every morning | ORAL | Status: DC
  Filled 2014-09-02: qty 1
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
## 2014-09-02 NOTE — ED Notes (Signed)
Bed: WA01 Expected date:  Expected time:  Means of arrival:  Comments: EMS-fever/tachy

## 2014-09-02 NOTE — Progress Notes (Signed)
[facility] consulted to speak to patient regarding medication assistance.  Patient has [employer].  Patient reports his deductible was increased and he is having difficulty affording certain medications.  Patient unable to say which medications.  EDCM suggested patient ask pcp for samples of medications to offer assistance if available.  EDCM unable to assist with medications dues to patient has insurance.  No further EDCM needs at this time.

## 2014-09-02 NOTE — ED Notes (Signed)
Report called to 5th floor, pt waiting in ED for CT to be completed before transport.
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
## 2014-09-02 NOTE — ED Notes (Signed)
Ultrasound IV unsuccessful.

## 2014-09-02 NOTE — ED Notes (Addendum)
Per ems pt from Group home, per pt he woke up this Am with chills, fever and nausea. Per ems per pt he had incision debridement yesterday and received a call from Dr reporting positive culture of wound. Pt presents with suprabubic catheter and colostomy bag placed prior to arrival.  Pt quadriplegic alert and oriented. BP 92/64 HR 118 RR20,  98% RA.

## 2014-09-03 DIAGNOSIS — M899 Disorder of bone, unspecified: Secondary | ICD-10-CM | POA: Diagnosis present

## 2014-09-03 DIAGNOSIS — M86659 Other chronic osteomyelitis, unspecified thigh: Secondary | ICD-10-CM

## 2014-09-03 DIAGNOSIS — L89103 Pressure ulcer of unspecified part of back, stage 3: Secondary | ICD-10-CM

## 2014-09-03 DIAGNOSIS — M869 Osteomyelitis, unspecified: Secondary | ICD-10-CM

## 2014-09-03 DIAGNOSIS — N390 Urinary tract infection, site not specified: Secondary | ICD-10-CM

## 2014-09-03 DIAGNOSIS — G8250 Quadriplegia, unspecified: Secondary | ICD-10-CM

## 2014-09-03 DIAGNOSIS — L89303 Pressure ulcer of unspecified buttock, stage 3: Secondary | ICD-10-CM

## 2014-09-03 LAB — RETICULOCYTES
RBC.: 3.45 MIL/uL — ABNORMAL LOW (ref 4.22–5.81)
Retic Count, Absolute: 55.2 10*3/uL (ref 19.0–186.0)
Retic Ct Pct: 1.6 % (ref 0.4–3.1)

## 2014-09-03 LAB — BASIC METABOLIC PANEL
ANION GAP: 8 (ref 5–15)
BUN: 19 mg/dL (ref 6–23)
CHLORIDE: 103 mmol/L (ref 96–112)
CO2: 26 mmol/L (ref 19–32)
Calcium: 8.3 mg/dL — ABNORMAL LOW (ref 8.4–10.5)
Creatinine, Ser: 0.5 mg/dL (ref 0.50–1.35)
GFR calc Af Amer: >90 mL/min (ref >90)
GFR calc non Af Amer: >90 mL/min (ref >90)
Glucose, Bld: 109 mg/dL — ABNORMAL HIGH (ref 70–99)
Potassium: 4.6 mmol/L (ref 3.5–5.1)
SODIUM: 137 mmol/L (ref 135–145)

## 2014-09-03 LAB — CBC
HCT: 24.4 % — ABNORMAL LOW (ref 39.0–52.0)
HEMOGLOBIN: 7.1 g/dL — AB (ref 13.0–17.0)
MCH: 20.7 pg — ABNORMAL LOW (ref 26.0–34.0)
MCHC: 29.1 g/dL — AB (ref 30.0–36.0)
MCV: 71.1 fL — ABNORMAL LOW (ref 78.0–100.0)
Platelets: 574 10*3/uL — ABNORMAL HIGH (ref 150–400)
RBC: 3.43 MIL/uL — ABNORMAL LOW (ref 4.22–5.81)
RDW: 17.6 % — ABNORMAL HIGH (ref 11.5–15.5)
WBC: 14.3 10*3/uL — ABNORMAL HIGH (ref 4.0–10.5)

## 2014-09-03 LAB — FOLATE: Folate: >20 ng/mL

## 2014-09-03 LAB — IRON AND TIBC: UIBC: 133 ug/dL (ref 125–400)

## 2014-09-03 LAB — VITAMIN B12: VITAMIN B 12: 1259 pg/mL — AB (ref 211–911)

## 2014-09-03 LAB — FERRITIN: Ferritin: 568 ng/mL — ABNORMAL HIGH (ref 22–322)

## 2014-09-03 MED ORDER — DAKINS (1/4 STRENGTH) 0.125 % EX SOLN
Freq: Every morning | CUTANEOUS | Status: DC
Start: 2014-09-03 — End: 2014-09-05
  Filled 2014-09-03: qty 473

## 2014-09-03 MED ORDER — DEXTROSE 5 % IV SOLN
2.0000 g | Freq: Two times a day (BID) | INTRAVENOUS | Status: DC
  Administered 2014-09-03 – 2014-09-04 (×3): 2 g via INTRAVENOUS
  Filled 2014-09-03 (×3): qty 2

## 2014-09-03 MED ORDER — SODIUM CHLORIDE 0.9 % IV SOLN
1250.0000 mg | Freq: Two times a day (BID) | INTRAVENOUS | Status: DC
  Administered 2014-09-03 – 2014-09-04 (×3): 1250 mg via INTRAVENOUS
  Filled 2014-09-03 (×4): qty 1250

## 2014-09-03 MED ORDER — PIPERACILLIN-TAZOBACTAM 3.375 G IVPB
3.3750 g | Freq: Three times a day (TID) | INTRAVENOUS | Status: DC
  Administered 2014-09-03: 3.375 g via INTRAVENOUS
  Filled 2014-09-03 (×2): qty 50

## 2014-09-03 MED ORDER — METRONIDAZOLE 500 MG PO TABS
500.0000 mg | ORAL_TABLET | Freq: Three times a day (TID) | ORAL | Status: DC
Start: 2014-09-03 — End: 2014-09-08
  Administered 2014-09-03 – 2014-09-08 (×15): 500 mg via ORAL
  Filled 2014-09-03 (×17): qty 1

## 2014-09-03 NOTE — Consult Note (Addendum)
WOC wound consult note Reason for Consult: Chronic pressure ulcers to buttocks and sacrum.  New ulcer to right inguinal area.  Recently debrided.  Osteo present.  Wound type: Chronic Stage IV pressure ulcers to sacrum and buttocks.  New right inguinal wound recently debrided.  Osteo present  Pressure Ulcer POA: Yes Measurement: Left buttocks and sacrum:  2 cm x 15 cm x 7.5 cm Right buttocks 11 cm x 5 cm x 0.3 cm Right inguinal wound 3.3 cm x 2.5 cm x 3 cm Wound bed: 100% pale pink and friable, nongranulating.  Drainage (amount, consistency, odor) Heavy serosanguinous drainage.  Musty odor Periwound: Intact Dressing procedure/placement/frequency:Cleanse ulcers to sacrum, buttocks and right inguinal wounds with NS and pat gently dry.  Gently fill wound with Dakin's moistened kerlix and cover with ABD pads and tape.  Change daily.  Bedside nurses may perform.  Will not follow at this time.  Please re-consult if needed.  Domenic Moras RN BSN [place] Pager [phone]

## 2014-09-03 NOTE — Progress Notes (Signed)
[facility]  Patient Status: Active (receiving services up to time of hospitalization)  [facility] is providing the following services: RN, Currently on home IV Zosyn  If patient discharges after hours, please call [phone].   Lurlean Leyden 09/03/2014, 5:10 PM

## 2014-09-03 NOTE — Progress Notes (Signed)
Spoke with primary RN from night shift regarding PICC order still being active for IV Team to insert PICC.   Interventional Radiology has to insert his PICCs due to occlusions.

## 2014-09-03 NOTE — Progress Notes (Signed)
Utilization review completed.  
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
## 2014-09-03 NOTE — Progress Notes (Signed)
PROGRESS NOTE  [name] [name] WJX:[id number] DOB: Jul 27, 1965 DOA: 09/02/2014 PCP: [name], MD  HPI: 49 yo M with history of quadriplegia and multiple decubitus wounds, presented to the ED on 4/19 with fever and chills over the last 24 hours, with concerns for decubitus ulcer infections as well as a UTI.   Subjective / 24 H Interval events - Patient feeling better this morning, denies any further fever or chills, has no nausea/chest pain/shortness of breath  Assessment/Plan: Principal Problem:   Osteomyelitis Active Problems:   Quadriplegia   Seizure disorder   Sacral decubitus ulcer, stage IV   Microcytic anemia   Complicated UTI (urinary tract infection)   Wound infection   Decubitus ulcer of ischium, stage 3   Decubitus ulcer of back, stage 3   Open wound of pelvic region with complication   Sacral decubitus ulcer   Osteomyelitis /wound infection with multiple sacral decubitus ulcers  - Overall very difficult situation, he definitely needs antibiotics right now, looks like a bone sample done by Dr. [name] during surgery for right groin ulcer last week grew Pseudomonas.  - It appears that there is communication with the right hip with gas present in the right hip per CT scan. I have consulted Orthopedic surgery for further evaluation.  - plastic surgery following - consulted ID today   Chronic quadriplegia - continue home medications  Complicated UTI  - antibiotics as above, urine culture pending  Microcytic anemia  - likely anemia of chronic disease - monitor, transfuse for Hb < 7  HTN  - soft, BPs, hold Norvasc fornow   Diet: Diet regular Room service appropriate?: Yes; Fluid consistency:: Thin Fluids: NS DVT Prophylaxis: Lovenox  Code Status: Full Code Family Communication: d/w wife bedside  Disposition Plan: remain inpatient  Consultants:  Plastic surgery  Orthopedic surgery   Infectious disease  Procedures:  None     Antibiotics  Anti-infectives    Start     Dose/Rate Route Frequency Ordered Stop   09/03/14 1400  metroNIDAZOLE (FLAGYL) tablet 500 mg     500 mg Oral 3 times per day 09/03/14 1138     09/03/14 1145  ceFEPIme (MAXIPIME) 2 g in dextrose 5 % 50 mL IVPB     2 g 100 mL/hr over 30 Minutes Intravenous Every 12 hours 09/03/14 1138     09/03/14 1100  vancomycin (VANCOCIN) 1,250 mg in sodium chloride 0.9 % 250 mL IVPB     1,250 mg 166.7 mL/hr over 90 Minutes Intravenous Every 12 hours 09/03/14 1006     09/03/14 0600  piperacillin-tazobactam (ZOSYN) IVPB 3.375 g  Status:  Discontinued     3.375 g 12.5 mL/hr over 240 Minutes Intravenous 3 times per day 09/03/14 0454 09/03/14 1138   09/02/14 2330  vancomycin (VANCOCIN) 1,250 mg in sodium chloride 0.9 % 250 mL IVPB     1,250 mg 166.7 mL/hr over 90 Minutes Intravenous  Once 09/02/14 2326 09/03/14 0201   09/02/14 1945  piperacillin-tazobactam (ZOSYN) IVPB 3.375 g     3.375 g 12.5 mL/hr over 240 Minutes Intravenous  Once 09/02/14 1938 09/03/14 0430       Studies  Ct Abdomen Pelvis Wo Contrast  09/02/2014   CLINICAL DATA:  Woke up today with chills, fever and nausea. Infected wound. Suprapubic catheter an colostomy. Quadriplegia.  EXAM: CT ABDOMEN AND PELVIS WITHOUT CONTRAST  TECHNIQUE: Multidetector CT imaging of the abdomen and pelvis was performed following the standard protocol without IV contrast.  COMPARISON:  04/17/2014  FINDINGS: There are small pleural effusions layering dependently. Mild dependent pulmonary atelectasis, left more than right. Probable mild chronic bronchiectasis in the left lower lobe.  The liver has a normal appearance without contrast. No calcified gallstones. The spleen is normal. The pancreas is normal. The adrenal glands are normal. The kidneys are normal. No cyst, mass, stone or hydronephrosis. The aorta and IVC are normal. No retroperitoneal mass or adenopathy. No free intraperitoneal fluid or air. There is an ostomy  in the left lower quadrant. No complications seen relative to that. There is a suprapubic tube. There is no evidence of intra abdominal abscess or complication. The appendix is normal. There are chronic pressure ulcerations/sacral decubital ulceration and there are chronic bony changes of the pelvis and hip related to chronic inflammation. This includes the right hip joint which shows open communication with air within the joint.  IMPRESSION: Small bilateral effusions with dependent atelectasis.  No acute abdominal organ pathology. No complications seen related to the suprapubic bladder catheter or the left abdominal ostomy.  Extensive sacral decubitus ulcers with chronic inflammatory changes of the bones of the pelvis and destructive changes of the hips. Air/ gas present within the right hip today, apparently in direct communication with an ulceration, and implying septic joint.   Electronically Signed   By: Nelson Chimes M.D.   On: 09/02/2014 23:11   Dg Chest Port 1 View  09/02/2014   CLINICAL DATA:  Hypertension, awoke with chills, fever and nausea today, had incision and debridement yesterday, quadriplegic, hypertension, GERD  EXAM: PORTABLE CHEST - 1 VIEW  COMPARISON:  Portable exam 1655 hours compared to 07/15/2014  FINDINGS: Enlargement of cardiac silhouette.  Slightly prominent superior mediastinum unchanged.  Pulmonary vascularity normal.  Minimal RIGHT basilar atelectasis.  Lungs otherwise clear.  No pleural effusion or pneumothorax.  Lytic bone lesion 4.4 cm length posterior RIGHT fifth rib.  Exophytic sclerotic bone lesion versus extrapleural density lateral mid LEFT chest unchanged.  No other definite focal osseous abnormalities.  IMPRESSION: Enlargement of cardiac silhouette with minimal RIGHT basilar atelectasis.  Lytic bone lesion posterior RIGHT fifth rib, question myeloma or a lytic osseous metastasis.   Electronically Signed   By: Lavonia Dana M.D.   On: 09/02/2014 17:10    Objective  Filed  Vitals:   09/03/14 0035 09/03/14 0542 09/03/14 1000 09/03/14 1105  BP:  92/68 90/48 90/48   Pulse:  98 110   Temp:  99.3 F (37.4 C) 99.7 F (37.6 C)   TempSrc:  Oral Oral   Resp:  18 18   Height: 6' (1.829 m)     Weight: 96.5 kg (212 lb 11.9 oz)     SpO2:  98% 94%     Intake/Output Summary (Last 24 hours) at 09/03/14 1149 Last data filed at 09/03/14 1000  Gross per 24 hour  Intake 374.17 ml  Output   1520 ml  Net -1145.83 ml   Filed Weights   09/03/14 0035  Weight: 96.5 kg (212 lb 11.9 oz)    Exam:  General:  NAD, pleasant   HEENT: no scleral icterus, PERRL  Cardiovascular: RRR without MRG, 2+ peripheral pulses, no edema  Respiratory: CTA biL, good air movement, no wheezing, no crackles, no rales  Abdomen: soft, non tender, BS +, no guarding  MSK/Extremities: no clubbing/cyanosis, sacral bilateral ischial ulcers, no drainage  Skin: no rashes  Neuro: extensive spasticity   Data Reviewed: Basic Metabolic Panel:  Recent Labs Lab 08/28/14 1249 09/02/14 1918 09/03/14 0448  NA 138 136 137  K 4.0 4.7 4.6  CL 104 101 103  CO2 24  --  26  GLUCOSE 111* 128* 109*  BUN 14 21 19   CREATININE 0.41* 0.70 0.50  CALCIUM 8.8  --  8.3*   CBC:  Recent Labs Lab 08/28/14 1249 09/02/14 1905 09/02/14 1918 09/03/14 0448  WBC 14.7* 17.8*  --  14.3*  NEUTROABS  --  14.0*  --   --   HGB 7.6* 7.7* 10.2* 7.1*  HCT 25.9* 26.7* 30.0* 24.4*  MCV 71.2* 70.8*  --  71.1*  PLT 574* 708*  --  574*   CBG:  Recent Labs Lab 08/28/14 1830  GLUCAP 89    Recent Results (from the past 240 hour(s))  Anaerobic culture     Status: None (Preliminary result)   Collection Time: 08/28/14  2:22 PM  Result Value Ref Range Status   Specimen Description WOUND RIGHT GROIN  Final   Special Requests NONE PART A   Final   Gram Stain   Final    RARE WBC PRESENT, PREDOMINANTLY PMN NO SQUAMOUS EPITHELIAL CELLS SEEN FEW GRAM POSITIVE COCCI IN PAIRS Performed at [facility]     Culture   Final    NO ANAEROBES ISOLATED; CULTURE IN PROGRESS FOR 5 DAYS Performed at [facility]    Report Status PENDING  Incomplete  Wound culture     Status: None   Collection Time: 08/28/14  2:22 PM  Result Value Ref Range Status   Specimen Description WOUND RIGHT GROIN  Final   Special Requests  PART A  Final   Gram Stain   Final    NO WBC SEEN NO SQUAMOUS EPITHELIAL CELLS SEEN NO ORGANISMS SEEN Performed at [facility]    Culture   Final    MULTIPLE ORGANISMS PRESENT, NONE PREDOMINANT Note: NO STAPHYLOCOCCUS AUREUS ISOLATED NO GROUP A STREP (S.PYOGENES) ISOLATED Performed at [facility]    Report Status 08/30/2014 FINAL  Final  Anaerobic culture     Status: None (Preliminary result)   Collection Time: 08/28/14  2:27 PM  Result Value Ref Range Status   Specimen Description TISSUE RIGHT GROIN  Final   Special Requests PART B  Final   Gram Stain   Final    NO WBC SEEN NO SQUAMOUS EPITHELIAL CELLS SEEN NO ORGANISMS SEEN Performed at [facility]    Culture   Final    NO ANAEROBES ISOLATED; CULTURE IN PROGRESS FOR 5 DAYS Performed at [facility]    Report Status PENDING  Incomplete  Tissue culture     Status: None   Collection Time: 08/28/14  2:27 PM  Result Value Ref Range Status   Specimen Description TISSUE RIGHT GROIN  Final   Special Requests PART B  Final   Gram Stain   Final    RARE WBC PRESENT, PREDOMINANTLY PMN NO SQUAMOUS EPITHELIAL CELLS SEEN NO ORGANISMS SEEN Performed at [facility]    Culture   Final    MODERATE PSEUDOMONAS AERUGINOSA Performed at [facility]    Report Status 08/31/2014 FINAL  Final   Organism ID, Bacteria PSEUDOMONAS AERUGINOSA  Final      Susceptibility   Pseudomonas aeruginosa - MIC*    CEFEPIME 8 SENSITIVE Sensitive     CEFTAZIDIME 8 SENSITIVE Sensitive     CIPROFLOXACIN 1 SENSITIVE Sensitive     GENTAMICIN 8 INTERMEDIATE Intermediate     IMIPENEM <=0.25  SENSITIVE Sensitive  TOBRAMYCIN <=1 SENSITIVE Sensitive     * MODERATE PSEUDOMONAS AERUGINOSA  Surgical pcr screen     Status: None   Collection Time: 08/28/14  2:35 PM  Result Value Ref Range Status   MRSA, PCR NEGATIVE NEGATIVE Final   Staphylococcus aureus NEGATIVE NEGATIVE Final    Comment:        The Xpert SA Assay (FDA approved for NASAL specimens in patients over 64 years of age), is one component of a comprehensive surveillance program.  Test performance has been validated by [facility] for patients greater than or equal to 12 year old. It is not intended to diagnose infection nor to guide or monitor treatment.      Scheduled Meds: . amLODipine  2.5 mg Oral q morning - 10a  . baclofen  20 mg Oral QID  . ceFEPime (MAXIPIME) IV  2 g Intravenous Q12H  . darifenacin  15 mg Oral Daily  . enoxaparin (LOVENOX) injection  40 mg Subcutaneous QHS  . famotidine  20 mg Oral BID  . lactobacillus  1 g Oral TID WC  . lactose free nutrition  1 Container Oral TID BM  . metoCLOPramide  10 mg Oral TID AC  . metroNIDAZOLE  500 mg Oral 3 times per day  . multivitamin with minerals  1 tablet Oral q morning - 10a  . nutrition supplement (JUVEN)  1 packet Oral BID BM  . polyethylene glycol  17 g Oral Daily  . vancomycin  1,250 mg Intravenous Q12H  . vitamin C  500 mg Oral q morning - 10a  . zinc sulfate  220 mg Oral q morning - 10a   Continuous Infusions: . sodium chloride 50 mL/hr (09/03/14 0031)    Time spent: 35 minutes in patient evaluation, extensive chart review and recent surgery notes review, discussing with family at bedside and consulting ID and orthopedic surgery.    Marzetta Board, MD [facility] Pager [phone]. If 7 PM - 7 AM, please contact night-coverage at [email or web address], password [facility] 09/03/2014, 11:49 AM  LOS: 1 day

## 2014-09-03 NOTE — Progress Notes (Signed)
Patient has just severe and extensive decube to across gluteal and perineal and groin, in various stages of breakdown and healing.  Zurich Carreno Joya Gaskins 09/03/14, 0923.

## 2014-09-03 NOTE — Progress Notes (Signed)
ANTIBIOTIC CONSULT NOTE - FOLLOW UP  Pharmacy Consult for vancomycin and zosyn Indication: wound infection and osteomyletis  Allergies  Allergen Reactions  . Ditropan [Oxybutynin] Other (See Comments)    hallucinations    Patient Measurements: Height: 6' (182.9 cm) Weight: 212 lb 11.9 oz (96.5 kg) IBW/kg (Calculated) : 77.6  Vital Signs: Temp: 99.3 F (37.4 C) (04/20 0542) Temp Source: Oral (04/20 0542) BP: 92/68 mmHg (04/20 0542) Pulse Rate: 98 (04/20 0542) Intake/Output from previous day: 04/19 0701 - 04/20 0700 In: 374.2 [I.V.:124.2; IV Piggyback:250] Out: 1270 [Urine:1270] Intake/Output from this shift: Total I/O In: -  Out: 150 [Urine:150]  Labs:  Recent Labs  09/02/14 1905 09/02/14 1918 09/03/14 0448  WBC 17.8*  --  14.3*  HGB 7.7* 10.2* 7.1*  PLT 708*  --  574*  CREATININE  --  0.70 0.50   Estimated Creatinine Clearance: 136.1 mL/min (by C-G formula based on Cr of 0.5). No results for input(s): VANCOTROUGH, VANCOPEAK, VANCORANDOM, GENTTROUGH, GENTPEAK, GENTRANDOM, TOBRATROUGH, TOBRAPEAK, TOBRARND, AMIKACINPEAK, AMIKACINTROU, AMIKACIN in the last 72 hours.   Microbiology: Recent Results (from the past 720 hour(s))  Anaerobic culture     Status: None (Preliminary result)   Collection Time: 08/28/14  2:22 PM  Result Value Ref Range Status   Specimen Description WOUND RIGHT GROIN  Final   Special Requests NONE PART A   Final   Gram Stain   Final    RARE WBC PRESENT, PREDOMINANTLY PMN NO SQUAMOUS EPITHELIAL CELLS SEEN FEW GRAM POSITIVE COCCI IN PAIRS Performed at [facility]    Culture   Final    NO ANAEROBES ISOLATED; CULTURE IN PROGRESS FOR 5 DAYS Performed at [facility]    Report Status PENDING  Incomplete  Wound culture     Status: None   Collection Time: 08/28/14  2:22 PM  Result Value Ref Range Status   Specimen Description WOUND RIGHT GROIN  Final   Special Requests  PART A  Final   Gram Stain   Final    NO WBC SEEN NO  SQUAMOUS EPITHELIAL CELLS SEEN NO ORGANISMS SEEN Performed at [facility]    Culture   Final    MULTIPLE ORGANISMS PRESENT, NONE PREDOMINANT Note: NO STAPHYLOCOCCUS AUREUS ISOLATED NO GROUP A STREP (S.PYOGENES) ISOLATED Performed at [facility]    Report Status 08/30/2014 FINAL  Final  Anaerobic culture     Status: None (Preliminary result)   Collection Time: 08/28/14  2:27 PM  Result Value Ref Range Status   Specimen Description TISSUE RIGHT GROIN  Final   Special Requests PART B  Final   Gram Stain   Final    NO WBC SEEN NO SQUAMOUS EPITHELIAL CELLS SEEN NO ORGANISMS SEEN Performed at [facility]    Culture   Final    NO ANAEROBES ISOLATED; CULTURE IN PROGRESS FOR 5 DAYS Performed at [facility]    Report Status PENDING  Incomplete  Tissue culture     Status: None   Collection Time: 08/28/14  2:27 PM  Result Value Ref Range Status   Specimen Description TISSUE RIGHT GROIN  Final   Special Requests PART B  Final   Gram Stain   Final    RARE WBC PRESENT, PREDOMINANTLY PMN NO SQUAMOUS EPITHELIAL CELLS SEEN NO ORGANISMS SEEN Performed at [facility]    Culture   Final    MODERATE PSEUDOMONAS AERUGINOSA Performed at [facility]    Report Status 08/31/2014 FINAL  Final   Organism ID, Bacteria PSEUDOMONAS AERUGINOSA  Final      Susceptibility   Pseudomonas aeruginosa - MIC*    CEFEPIME 8 SENSITIVE Sensitive     CEFTAZIDIME 8 SENSITIVE Sensitive     CIPROFLOXACIN 1 SENSITIVE Sensitive     GENTAMICIN 8 INTERMEDIATE Intermediate     IMIPENEM <=0.25 SENSITIVE Sensitive     TOBRAMYCIN <=1 SENSITIVE Sensitive     * MODERATE PSEUDOMONAS AERUGINOSA  Surgical pcr screen     Status: None   Collection Time: 08/28/14  2:35 PM  Result Value Ref Range Status   MRSA, PCR NEGATIVE NEGATIVE Final   Staphylococcus aureus NEGATIVE NEGATIVE Final    Comment:        The Xpert SA Assay (FDA approved for NASAL specimens in patients  over 15 years of age), is one component of a comprehensive surveillance program.  Test performance has been validated by [facility] for patients greater than or equal to 87 year old. It is not intended to diagnose infection nor to guide or monitor treatment.     Anti-infectives    Start     Dose/Rate Route Frequency Ordered Stop   09/03/14 0600  piperacillin-tazobactam (ZOSYN) IVPB 3.375 g     3.375 g 12.5 mL/hr over 240 Minutes Intravenous 3 times per day 09/03/14 0454     09/02/14 2330  vancomycin (VANCOCIN) 1,250 mg in sodium chloride 0.9 % 250 mL IVPB     1,250 mg 166.7 mL/hr over 90 Minutes Intravenous  Once 09/02/14 2326 09/03/14 0201   09/02/14 1945  piperacillin-tazobactam (ZOSYN) IVPB 3.375 g     3.375 g 12.5 mL/hr over 240 Minutes Intravenous  Once 09/02/14 1938 09/03/14 0430      Assessment: Patient is a 49 y.o M with quadriplegia and sacral/right groin ulcer.  scr 0.50 (quadriplegic), crcl~100  4/19 zosyn>> 4/20 vanc>>  4/19 ucx: 4/14 right groin tissue: moderate pseudomonas (sens to everything except intermediate to gent)   Goal of Therapy:  Vancomycin trough level 15-20 mcg/ml  Plan:  - cont zosyn EI - vancomycin 1250mg  IV q12h (based on prior experience with admit in august 2015). Will check level at steady state  Glenroy Crossen P 09/03/2014,9:58 AM
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
## 2014-09-03 NOTE — Progress Notes (Addendum)
Pt placed on Auto CPAP 12-19 CMH20 via [facility] per Pt home use.  Pt tolerating well at this time, RT to monitor and assess as needed.
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
## 2014-09-04 ENCOUNTER — Inpatient Hospital Stay (HOSPITAL_COMMUNITY): Payer: Medicare Other

## 2014-09-04 DIAGNOSIS — R509 Fever, unspecified: Secondary | ICD-10-CM

## 2014-09-04 DIAGNOSIS — T8189XA Other complications of procedures, not elsewhere classified, initial encounter: Secondary | ICD-10-CM

## 2014-09-04 DIAGNOSIS — D509 Iron deficiency anemia, unspecified: Secondary | ICD-10-CM

## 2014-09-04 DIAGNOSIS — M899 Disorder of bone, unspecified: Secondary | ICD-10-CM

## 2014-09-04 LAB — ANAEROBIC CULTURE: Gram Stain: NONE SEEN

## 2014-09-04 LAB — BASIC METABOLIC PANEL
Anion gap: 8 (ref 5–15)
BUN: 19 mg/dL (ref 6–23)
CALCIUM: 7.9 mg/dL — AB (ref 8.4–10.5)
CO2: 23 mmol/L (ref 19–32)
CREATININE: 0.55 mg/dL (ref 0.50–1.35)
Chloride: 104 mmol/L (ref 96–112)
GFR calc Af Amer: >90 mL/min (ref >90)
GFR calc non Af Amer: >90 mL/min (ref >90)
GLUCOSE: 148 mg/dL — AB (ref 70–99)
Potassium: 3.5 mmol/L (ref 3.5–5.1)
Sodium: 135 mmol/L (ref 135–145)

## 2014-09-04 LAB — CBC
HCT: 22.3 % — ABNORMAL LOW (ref 39.0–52.0)
Hemoglobin: 6.5 g/dL — CL (ref 13.0–17.0)
MCH: 20.8 pg — ABNORMAL LOW (ref 26.0–34.0)
MCHC: 29.1 g/dL — ABNORMAL LOW (ref 30.0–36.0)
MCV: 71.5 fL — ABNORMAL LOW (ref 78.0–100.0)
Platelets: 494 10*3/uL — ABNORMAL HIGH (ref 150–400)
RBC: 3.12 MIL/uL — ABNORMAL LOW (ref 4.22–5.81)
RDW: 17.6 % — ABNORMAL HIGH (ref 11.5–15.5)
WBC: 13.5 10*3/uL — ABNORMAL HIGH (ref 4.0–10.5)

## 2014-09-04 LAB — PREPARE RBC (CROSSMATCH)

## 2014-09-04 LAB — PREALBUMIN: PREALBUMIN: 8 mg/dL — AB (ref 21–43)

## 2014-09-04 MED ORDER — CIPROFLOXACIN HCL 750 MG PO TABS
750.0000 mg | ORAL_TABLET | Freq: Two times a day (BID) | ORAL | Status: DC
  Administered 2014-09-04 – 2014-09-08 (×8): 750 mg via ORAL
  Filled 2014-09-04 (×11): qty 1

## 2014-09-04 MED ORDER — SODIUM CHLORIDE 0.9 % IV SOLN: Freq: Once | INTRAVENOUS | Status: DC

## 2014-09-04 MED ORDER — LIDOCAINE HCL 1 % IJ SOLN
INTRAMUSCULAR | Status: AC
  Filled 2014-09-04: qty 20

## 2014-09-04 MED ORDER — LIDOCAINE HCL (PF) 1 % IJ SOLN
INTRAMUSCULAR | Status: DC
Start: 2014-09-04 — End: 2014-09-04
  Filled 2014-09-04: qty 30

## 2014-09-04 NOTE — Plan of Care (Signed)
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  Goal: Voiding-avoid urinary catheter unless indicated Outcome: Completed/Met Date Met:  09/04/14 Pt has suprapubic cath.
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
## 2014-09-04 NOTE — Procedures (Signed)
Interventional Radiology Procedure Note  Procedure: Placement of a right IJ approach double lumen 26F catheter, 18cm length.   Tip is positioned at the superior cavoatrial junction and catheter is ready for immediate use.  Complications: No immediate Recommendations:  - Ok to shower tomorrow - Do not submerge  - Routine line care   Signed,  Dulcy Fanny. Earleen Newport, DO

## 2014-09-04 NOTE — Progress Notes (Signed)
CARE MANAGEMENT NOTE 09/04/2014  Patient:  [name] [name], [name] [name]   Account Number:  [id number]  Date Initiated:  09/04/2014  Documentation initiated by:  Karl Bales  Subjective/Objective Assessment:   Pt admitted with wound infection     Action/Plan:   from home   Anticipated DC Date:  09/06/2014   Anticipated DC Plan:  [place]  CM consult      Choice offered to / List presented to:  C-1 Patient        [place] arranged  HH-1 RN      [place].   Status of service:  In process, will continue to follow Medicare Important Message given?   (If response is "NO", the following Medicare IM given date fields will be blank) Date Medicare IM given:   Medicare IM given by:   Date Additional Medicare IM given:   Additional Medicare IM given by:    Discharge Disposition:  [place]  Per UR Regulation:  Reviewed for med. necessity/level of care/duration of stay  If discussed at [place] of Stay Meetings, dates discussed:    Comments:  09/04/14 MMcGibboney, RN, BSN Spoke with pt concerning medications.  Pt states that he spoke with CM in ED concerning that issue.

## 2014-09-04 NOTE — Progress Notes (Signed)
ANTIBIOTIC CONSULT NOTE - FOLLOW UP  Pharmacy Consult for vancomycin  Indication: wound infection and osteomyletis  Allergies  Allergen Reactions  . Ditropan [Oxybutynin] Other (See Comments)    hallucinations    Patient Measurements: Height: 6' (182.9 cm) Weight: 212 lb 11.9 oz (96.5 kg) IBW/kg (Calculated) : 77.6  Vital Signs: Temp: 97.4 F (36.3 C) (04/21 1000) Temp Source: Oral (04/21 1000) BP: 84/56 mmHg (04/21 1000) Pulse Rate: 105 (04/21 1000) Intake/Output from previous day: 04/20 0701 - 04/21 0700 In: 2070 [P.O.:720; I.V.:1350] Out: 2800 [Urine:2100; Stool:700] Intake/Output from this shift: Total I/O In: 120 [P.O.:120] Out: 200 [Urine:200]  Labs:  Recent Labs  09/02/14 1905 09/02/14 1918 09/03/14 0448 09/04/14 1216  WBC 17.8*  --  14.3* 13.5*  HGB 7.7* 10.2* 7.1* 6.5*  PLT 708*  --  574* 494*  CREATININE  --  0.70 0.50 0.55   Estimated Creatinine Clearance: 136.1 mL/min (by C-G formula based on Cr of 0.55). No results for input(s): VANCOTROUGH, VANCOPEAK, VANCORANDOM, GENTTROUGH, GENTPEAK, GENTRANDOM, TOBRATROUGH, TOBRAPEAK, TOBRARND, AMIKACINPEAK, AMIKACINTROU, AMIKACIN in the last 72 hours.   Microbiology: Recent Results (from the past 720 hour(s))  Anaerobic culture     Status: None   Collection Time: 08/28/14  2:22 PM  Result Value Ref Range Status   Specimen Description WOUND RIGHT GROIN  Final   Special Requests NONE PART A   Final   Gram Stain   Final    RARE WBC PRESENT, PREDOMINANTLY PMN NO SQUAMOUS EPITHELIAL CELLS SEEN FEW GRAM POSITIVE COCCI IN PAIRS Performed at [facility]    Culture   Final    NO ANAEROBES ISOLATED Performed at [facility]    Report Status 09/04/2014 FINAL  Final  Wound culture     Status: None   Collection Time: 08/28/14  2:22 PM  Result Value Ref Range Status   Specimen Description WOUND RIGHT GROIN  Final   Special Requests  PART A  Final   Gram Stain   Final    NO WBC SEEN NO  SQUAMOUS EPITHELIAL CELLS SEEN NO ORGANISMS SEEN Performed at [facility]    Culture   Final    MULTIPLE ORGANISMS PRESENT, NONE PREDOMINANT Note: NO STAPHYLOCOCCUS AUREUS ISOLATED NO GROUP A STREP (S.PYOGENES) ISOLATED Performed at [facility]    Report Status 08/30/2014 FINAL  Final  Anaerobic culture     Status: None   Collection Time: 08/28/14  2:27 PM  Result Value Ref Range Status   Specimen Description TISSUE RIGHT GROIN  Final   Special Requests PART B  Final   Gram Stain   Final    NO WBC SEEN NO SQUAMOUS EPITHELIAL CELLS SEEN NO ORGANISMS SEEN Performed at [facility]    Culture   Final    NO ANAEROBES ISOLATED Performed at [facility]    Report Status 09/04/2014 FINAL  Final  Tissue culture     Status: None   Collection Time: 08/28/14  2:27 PM  Result Value Ref Range Status   Specimen Description TISSUE RIGHT GROIN  Final   Special Requests PART B  Final   Gram Stain   Final    RARE WBC PRESENT, PREDOMINANTLY PMN NO SQUAMOUS EPITHELIAL CELLS SEEN NO ORGANISMS SEEN Performed at [facility]    Culture   Final    MODERATE PSEUDOMONAS AERUGINOSA Performed at [facility]    Report Status 08/31/2014 FINAL  Final   Organism ID, Bacteria PSEUDOMONAS AERUGINOSA  Final      Susceptibility   Pseudomonas aeruginosa - MIC*    CEFEPIME 8 SENSITIVE Sensitive     CEFTAZIDIME 8 SENSITIVE Sensitive     CIPROFLOXACIN 1 SENSITIVE Sensitive     GENTAMICIN 8 INTERMEDIATE Intermediate     IMIPENEM <=0.25 SENSITIVE Sensitive     TOBRAMYCIN <=1 SENSITIVE Sensitive     * MODERATE PSEUDOMONAS AERUGINOSA  Surgical pcr screen     Status: None   Collection Time: 08/28/14  2:35 PM  Result Value Ref Range Status   MRSA, PCR NEGATIVE NEGATIVE Final   Staphylococcus aureus NEGATIVE NEGATIVE Final    Comment:        The Xpert SA Assay (FDA approved for NASAL specimens in patients over 13 years of age), is one component of a  comprehensive surveillance program.  Test performance has been validated by [facility] for patients greater than or equal to 75 year old. It is not intended to diagnose infection nor to guide or monitor treatment.   Urine culture     Status: None (Preliminary result)   Collection Time: 09/02/14  4:46 PM  Result Value Ref Range Status   Specimen Description URINE, CLEAN CATCH  Final   Special Requests Normal  Final   Culture   Final    Culture reincubated for better growth Performed at [facility]    Report Status PENDING  Incomplete    Anti-infectives    Start     Dose/Rate Route Frequency Ordered Stop   09/03/14 1400  metroNIDAZOLE (FLAGYL) tablet 500 mg     500 mg Oral 3 times per day 09/03/14 1138     09/03/14 1200  ceFEPIme (MAXIPIME) 2 g in dextrose 5 % 50 mL IVPB     2 g 100 mL/hr over 30 Minutes Intravenous Every 12 hours 09/03/14 1138     09/03/14 1100  vancomycin (VANCOCIN) 1,250 mg in sodium chloride 0.9 % 250 mL IVPB     1,250 mg 166.7 mL/hr over 90 Minutes Intravenous Every 12 hours 09/03/14 1006     09/03/14 0600  piperacillin-tazobactam (ZOSYN) IVPB 3.375 g  Status:  Discontinued     3.375 g 12.5 mL/hr over 240 Minutes Intravenous 3 times per day 09/03/14 0454 09/03/14 1138   09/02/14 2330  vancomycin (VANCOCIN) 1,250 mg in sodium chloride 0.9 % 250 mL IVPB     1,250 mg 166.7 mL/hr over 90 Minutes Intravenous  Once 09/02/14 2326 09/03/14 0201   09/02/14 1945  piperacillin-tazobactam (ZOSYN) IVPB 3.375 g     3.375 g 12.5 mL/hr over 240 Minutes Intravenous  Once 09/02/14 1938 09/03/14 0430      Assessment: Patient is a 49 y.o M with quadriplegia and sacral/right groin ulcer.  scr 0.50 (quadriplegic), crcl~100  4/20 vanc>> 4/19 zosyn>> 4/20 4/20 cefepime >> 4/20 flagyl po>>  4/19 ucx: 4/14 right groin tissue: moderate pseudomonas (sens to everything except intermediate to gent)   Goal of Therapy:  Vancomycin trough level 15-20  mcg/ml  Plan:  - vancomycin 1250mg  IV q12h (based on prior experience with admit in august 2015).  -check vanc trough tonight before 2300 dose  Dolly Rias RPh 09/04/2014, 1:22 PM Pager [phone]
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
## 2014-09-04 NOTE — Progress Notes (Signed)
PROGRESS NOTE  [name] [name] DTO:[id number] DOB: 1965-12-11 DOA: 09/02/2014 PCP: [name], MD  HPI: 49 yo M with history of quadriplegia and multiple decubitus wounds, presented to the ED on 4/19 with fever and chills over the last 24 hours, with concerns for decubitus ulcer infections as well as a UTI.   Subjective / 24 H Interval events - no chest pain, lightheadedness/dizziness  Assessment/Plan: Principal Problem:   Wound infection Active Problems:   Obesity   Quadriplegia   Seizure disorder   Sacral decubitus ulcer, stage IV   Microcytic anemia   S/P colostomy   Suprapubic catheter   Complicated UTI (urinary tract infection)   Chronic osteomyelitis, pelvic region and thigh   Severe protein-calorie malnutrition   Decubitus ulcer of ischium, stage 3   Decubitus ulcer of back, stage 3   Sacral decubitus ulcer   Lytic lesion of bone on x-ray   Osteomyelitis /wound infection with multiple sacral decubitus ulcers  - Overall very difficult situation, he definitely needs antibiotics right now, looks like a bone sample done by Dr. [name] during surgery for right groin ulcer last week grew Pseudomonas.  - It appears that there is communication with the right hip with gas present in the right hip per CT scan. Dr. Ronnie Derby consulted and saw patient 4/20. - plastic surgery evaluated patient, I have discussed with Dr. Iran Planas today, patient will need to be evaluated at a tertiary center for more advanced surgical approach, this does not need to be done urgently and can be done as outpatient - ID following as well, I have discussed with Dr. Megan Salon again today regarding this care  Lytic lesion posterior right 5th rib - I have ordered SPEP/UPEP, will need further evaluation as an outpatient as well   Microcytic anemia  - likely anemia of chronic disease, Hb 6.5 this morning,  - no evidence of GI bleed - transfuse 2U pRBC today.  - Difficult access, will place PICC Line with  IR.  Chronic quadriplegia - continue home medications  Complicated UTI  - antibiotics as above, urine culture pending  HTN  - soft, BPs, hold Norvasc for now   Diet: Diet regular Room service appropriate?: Yes; Fluid consistency:: Thin Fluids: NS DVT Prophylaxis: Lovenox  Code Status: Full Code Family Communication: no family bedside today  Disposition Plan: remain inpatient  Consultants:  Plastic surgery  Orthopedic surgery   Infectious disease  Procedures:  None    Antibiotics Cefepime 4/20 >> Metronidazole 4/20 >> Vancomycin 4/20    Studies  1. Ct Abdomen Pelvis Wo Contrast 09/02/2014 Small bilateral effusions with dependent atelectasis.  No acute abdominal organ pathology. No complications seen related to the suprapubic bladder catheter or the left abdominal ostomy.  Extensive sacral decubitus ulcers with chronic inflammatory changes of the bones of the pelvis and destructive changes of the hips. Air/ gas present within the right hip today, apparently in direct communication with an ulceration, and implying septic joint.   Electronically Signed   By: Nelson Chimes M.D.   On: 09/02/2014 23:11   2. Dg Chest Port 1 View 09/02/2014 Enlargement of cardiac silhouette with minimal RIGHT basilar atelectasis.  Lytic bone lesion posterior RIGHT fifth rib, question myeloma or a lytic osseous metastasis.   Electronically Signed   By: Lavonia Dana M.D.   On: 09/02/2014 17:10    Objective  Filed Vitals:   09/04/14 0248 09/04/14 0621 09/04/14 1000 09/04/14 1400  BP: 103/60 102/52 84/56 123/69  Pulse: 102  101 105 109  Temp: 98.9 F (37.2 C) 99.2 F (37.3 C) 97.4 F (36.3 C) 99.8 F (37.7 C)  TempSrc: Axillary Axillary Oral Oral  Resp: 20 20 18 18   Height:      Weight:      SpO2: 97% 99% 96% 100%    Intake/Output Summary (Last 24 hours) at 09/04/14 1512 Last data filed at 09/04/14 1400  Gross per 24 hour  Intake   2190 ml  Output   2850 ml  Net   -660 ml   Filed  Weights   09/03/14 0035  Weight: 96.5 kg (212 lb 11.9 oz)    Exam:  General:  NAD, pleasant   HEENT: no scleral icterus, PERRL  Cardiovascular: RRR without MRG, 2+ peripheral pulses, no edema  Respiratory: CTA biL, good air movement, no wheezing, no crackles, no rales  Abdomen: soft, non tender, BS +, no guarding  MSK/Extremities: no clubbing/cyanosis, sacral bilateral ischial ulcers, no drainage  Skin: no rashes  Neuro: extensive spasticity   Data Reviewed: Basic Metabolic Panel:  Recent Labs Lab 09/02/14 1918 09/03/14 0448 09/04/14 1216  NA 136 137 135  K 4.7 4.6 3.5  CL 101 103 104  CO2  --  26 23  GLUCOSE 128* 109* 148*  BUN 21 19 19   CREATININE 0.70 0.50 0.55  CALCIUM  --  8.3* 7.9*   CBC:  Recent Labs Lab 09/02/14 1905 09/02/14 1918 09/03/14 0448 09/04/14 1216  WBC 17.8*  --  14.3* 13.5*  NEUTROABS 14.0*  --   --   --   HGB 7.7* 10.2* 7.1* 6.5*  HCT 26.7* 30.0* 24.4* 22.3*  MCV 70.8*  --  71.1* 71.5*  PLT 708*  --  574* 494*   CBG:  Recent Labs Lab 08/28/14 1830  GLUCAP 89    Recent Results (from the past 240 hour(s))  Anaerobic culture     Status: None   Collection Time: 08/28/14  2:22 PM  Result Value Ref Range Status   Specimen Description WOUND RIGHT GROIN  Final   Special Requests NONE PART A   Final   Gram Stain   Final    RARE WBC PRESENT, PREDOMINANTLY PMN NO SQUAMOUS EPITHELIAL CELLS SEEN FEW GRAM POSITIVE COCCI IN PAIRS Performed at [facility]    Culture   Final    NO ANAEROBES ISOLATED Performed at [facility]    Report Status 09/04/2014 FINAL  Final  Wound culture     Status: None   Collection Time: 08/28/14  2:22 PM  Result Value Ref Range Status   Specimen Description WOUND RIGHT GROIN  Final   Special Requests  PART A  Final   Gram Stain   Final    NO WBC SEEN NO SQUAMOUS EPITHELIAL CELLS SEEN NO ORGANISMS SEEN Performed at [facility]    Culture   Final    MULTIPLE ORGANISMS  PRESENT, NONE PREDOMINANT Note: NO STAPHYLOCOCCUS AUREUS ISOLATED NO GROUP A STREP (S.PYOGENES) ISOLATED Performed at [facility]    Report Status 08/30/2014 FINAL  Final  Anaerobic culture     Status: None   Collection Time: 08/28/14  2:27 PM  Result Value Ref Range Status   Specimen Description TISSUE RIGHT GROIN  Final   Special Requests PART B  Final   Gram Stain   Final    NO WBC SEEN NO SQUAMOUS EPITHELIAL CELLS SEEN NO ORGANISMS SEEN Performed at [employer] [facility]  Final    NO ANAEROBES ISOLATED Performed at [facility]    Report Status 09/04/2014 FINAL  Final  Tissue culture     Status: None   Collection Time: 08/28/14  2:27 PM  Result Value Ref Range Status   Specimen Description TISSUE RIGHT GROIN  Final   Special Requests PART B  Final   Gram Stain   Final    RARE WBC PRESENT, PREDOMINANTLY PMN NO SQUAMOUS EPITHELIAL CELLS SEEN NO ORGANISMS SEEN Performed at [facility]    Culture   Final    MODERATE PSEUDOMONAS AERUGINOSA Performed at [facility]    Report Status 08/31/2014 FINAL  Final   Organism ID, Bacteria PSEUDOMONAS AERUGINOSA  Final      Susceptibility   Pseudomonas aeruginosa - MIC*    CEFEPIME 8 SENSITIVE Sensitive     CEFTAZIDIME 8 SENSITIVE Sensitive     CIPROFLOXACIN 1 SENSITIVE Sensitive     GENTAMICIN 8 INTERMEDIATE Intermediate     IMIPENEM <=0.25 SENSITIVE Sensitive     TOBRAMYCIN <=1 SENSITIVE Sensitive     * MODERATE PSEUDOMONAS AERUGINOSA  Surgical pcr screen     Status: None   Collection Time: 08/28/14  2:35 PM  Result Value Ref Range Status   MRSA, PCR NEGATIVE NEGATIVE Final   Staphylococcus aureus NEGATIVE NEGATIVE Final    Comment:        The Xpert SA Assay (FDA approved for NASAL specimens in patients over 28 years of age), is one component of a comprehensive surveillance program.  Test performance has been validated by [facility] for patients greater than or equal to  64 year old. It is not intended to diagnose infection nor to guide or monitor treatment.   Urine culture     Status: None (Preliminary result)   Collection Time: 09/02/14  4:46 PM  Result Value Ref Range Status   Specimen Description URINE, CLEAN CATCH  Final   Special Requests Normal  Final   Culture   Final    Culture reincubated for better growth Performed at [facility]    Report Status PENDING  Incomplete     Scheduled Meds: . sodium chloride   Intravenous Once  . baclofen  20 mg Oral QID  . ceFEPime (MAXIPIME) IV  2 g Intravenous Q12H  . darifenacin  15 mg Oral Daily  . enoxaparin (LOVENOX) injection  40 mg Subcutaneous QHS  . famotidine  20 mg Oral BID  . lactobacillus  1 g Oral TID WC  . lactose free nutrition  1 Container Oral TID BM  . metoCLOPramide  10 mg Oral TID AC  . metroNIDAZOLE  500 mg Oral 3 times per day  . multivitamin with minerals  1 tablet Oral q morning - 10a  . nutrition supplement (JUVEN)  1 packet Oral BID BM  . polyethylene glycol  17 g Oral Daily  . sodium hypochlorite   Irrigation q morning - 10a  . vancomycin  1,250 mg Intravenous Q12H  . vitamin C  500 mg Oral q morning - 10a  . zinc sulfate  220 mg Oral q morning - 10a   Continuous Infusions: . sodium chloride 50 mL/hr (09/04/14 0438)    Marzetta Board, MD [facility] Pager [phone]. If 7 PM - 7 AM, please contact night-coverage at [email or web address], password [facility] 09/04/2014, 3:12 PM  LOS: 2 days

## 2014-09-04 NOTE — Progress Notes (Signed)
Pt wants CPAP at around 2330, RT to monitor and assess as needed.

## 2014-09-04 NOTE — Progress Notes (Signed)
Pt noted to only have IO access. Blood drawn by lab and noted to have Hemoglobin 6.5. Paged Dr. Cruzita Lederer and received orders to give blood. Paged Dr. Cruzita Lederer again to obtain an order for another access as he cannot have blood through the access he has and IV team has attempted to place an IV prior. Order obtained. Contacted Interventional Radiology to have PICC placed. IR stated that if we could bring the pt down to them they could perform the procedure. Two CNAs began process of taking pt down to IR and noted his colostomy to have a leak. I changed the colostomy bag. During this change, the secretary contacted myself and informed me that the physician in IR had left for the day. Assistant Director Anderson Malta contacted IR to resolve issue. She found that there is a physician who rounds at the hospitals and needed to be paged by the attending to have this done. I paged Dr. Cruzita Lederer again to update him and have him page the IR MD. Dr. Cruzita Lederer called back and is going to work on resolving this issue.

## 2014-09-05 ENCOUNTER — Inpatient Hospital Stay (HOSPITAL_COMMUNITY): Payer: Medicare Other

## 2014-09-05 DIAGNOSIS — T798XXD Other early complications of trauma, subsequent encounter: Secondary | ICD-10-CM

## 2014-09-05 LAB — PROTEIN ELECTROPHORESIS, SERUM
A/G Ratio: 0.5 — ABNORMAL LOW (ref 0.7–2.0)
ALBUMIN ELP: 2.1 g/dL — AB (ref 3.2–5.6)
ALPHA-1-GLOBULIN: 0.4 g/dL (ref 0.1–0.4)
ALPHA-2-GLOBULIN: 0.8 g/dL (ref 0.4–1.2)
Beta Globulin: 1 g/dL (ref 0.6–1.3)
GAMMA GLOBULIN: 1.9 g/dL — AB (ref 0.5–1.6)
Globulin, Total: 4.1 g/dL (ref 2.0–4.5)
Total Protein ELP: 6.2 g/dL (ref 6.0–8.5)

## 2014-09-05 LAB — CBC
HEMATOCRIT: 26.4 % — AB (ref 39.0–52.0)
HEMOGLOBIN: 7.9 g/dL — AB (ref 13.0–17.0)
MCH: 21.8 pg — AB (ref 26.0–34.0)
MCHC: 29.9 g/dL — AB (ref 30.0–36.0)
MCV: 72.7 fL — ABNORMAL LOW (ref 78.0–100.0)
Platelets: 438 10*3/uL — ABNORMAL HIGH (ref 150–400)
RBC: 3.63 MIL/uL — ABNORMAL LOW (ref 4.22–5.81)
RDW: 17.5 % — ABNORMAL HIGH (ref 11.5–15.5)
WBC: 15.9 10*3/uL — ABNORMAL HIGH (ref 4.0–10.5)

## 2014-09-05 LAB — BASIC METABOLIC PANEL
Anion gap: 6 (ref 5–15)
BUN: 15 mg/dL (ref 6–23)
CO2: 21 mmol/L (ref 19–32)
CREATININE: 0.44 mg/dL — AB (ref 0.50–1.35)
Calcium: 7.7 mg/dL — ABNORMAL LOW (ref 8.4–10.5)
Chloride: 104 mmol/L (ref 96–112)
Glucose, Bld: 98 mg/dL (ref 70–99)
Potassium: 3.5 mmol/L (ref 3.5–5.1)
Sodium: 131 mmol/L — ABNORMAL LOW (ref 135–145)

## 2014-09-05 LAB — PREPARE RBC (CROSSMATCH)

## 2014-09-05 MED ORDER — SODIUM CHLORIDE 0.9 % IV SOLN
Freq: Once | INTRAVENOUS | Status: AC
  Administered 2014-09-05: 14:00:00 via INTRAVENOUS

## 2014-09-05 MED ORDER — DAKINS (1/4 STRENGTH) 0.125 % EX SOLN
Freq: Every morning | CUTANEOUS | Status: AC
  Administered 2014-09-06 – 2014-09-07 (×2)
  Filled 2014-09-05 (×2): qty 473

## 2014-09-05 NOTE — Plan of Care (Signed)
Problem: Phase I Progression Outcomes Goal: OOB as tolerated unless otherwise ordered Outcome: Not Applicable Date Met:  18/40/37 Pt is paralyzed and unable to get out of bed.  Goal: Initial discharge plan identified Outcome: Completed/Met Date Met:  09/05/14 Pt plans to go back home with caregiver.  Goal: Voiding-avoid urinary catheter unless indicated Outcome: Completed/Met Date Met:  09/05/14 Pt has suprapubic cath.

## 2014-09-05 NOTE — Progress Notes (Addendum)
PROGRESS NOTE  [name] [name] HBZ:[id number] DOB: October 26, 1965 DOA: 09/02/2014 PCP: [name], MD  HPI: 49 yo M with history of quadriplegia and multiple decubitus wounds, presented to the ED on 4/19 with fever and chills over the last 24 hours, with concerns for decubitus ulcer infections as well as a UTI.   Subjective / 24 H Interval events - feels more energetic today, appetite is better - denies abdominal pain, nausea - no chest pain/dyspnea  Assessment/Plan: Principal Problem:   Wound infection Active Problems:   Obesity   Quadriplegia   Seizure disorder   Sacral decubitus ulcer, stage IV   Microcytic anemia   S/P colostomy   Suprapubic catheter   Complicated UTI (urinary tract infection)   Chronic osteomyelitis, pelvic region and thigh   Severe protein-calorie malnutrition   Decubitus ulcer of ischium, stage 3   Decubitus ulcer of back, stage 3   Sacral decubitus ulcer   Lytic lesion of bone on x-ray   Osteomyelitis /wound infection with multiple sacral decubitus ulcers  - Overall very difficult situation, he definitely needs antibiotics right now, looks like a bone sample done by Dr. [name] during surgery for right groin ulcer last week grew Pseudomonas.  - It appears that there is communication with the right hip with gas present in the right hip per CT scan. Dr. Ronnie Derby consulted and saw patient 4/20, no need for acute interventions - plastic surgery evaluated patient, I have discussed with Dr. Iran Planas 4/21, patient will need to be evaluated at a tertiary center for more advanced surgical approach, likely multidisciplinary, this does not need to be done urgently and can be done as outpatient - ID following, patient was transitioned to oral antibiotics 4/21 and doing well since  Lytic lesion posterior right 5th rib - I have ordered SPEP/UPEP, will need further evaluation as an outpatient as well - he has an appointment with Dr. Burr Medico for his anemia as  well  Microcytic anemia  - likely anemia of chronic disease, Hb 6.5 on 4/21, received 2U with improvement but lower as expected.  - no evidence of GI bleed  - transfuse an additional unit today, total 3U pRBC  - Difficult access, s/p PICC Line with IR.  Chronic quadriplegia - continue home medications  Complicated UTI vs colonization - antibiotics as above, urine culture pending, preliminary with GNRs  HTN  - soft, BPs, hold Norvasc for now   Diet: Diet regular Room service appropriate?: Yes; Fluid consistency:: Thin Fluids: NS DVT Prophylaxis: Lovenox  Code Status: Full Code Family Communication: no family bedside today  Disposition Plan: remain inpatient, home 1-2 days  Consultants:  Plastic surgery  Orthopedic surgery   Infectious disease  Procedures:  None    Antibiotics Cefepime 4/20 >> Metronidazole 4/20 >> Vancomycin 4/20    Studies  1. Ct Abdomen Pelvis Wo Contrast 09/02/2014 Small bilateral effusions with dependent atelectasis.  No acute abdominal organ pathology. No complications seen related to the suprapubic bladder catheter or the left abdominal ostomy.  Extensive sacral decubitus ulcers with chronic inflammatory changes of the bones of the pelvis and destructive changes of the hips. Air/ gas present within the right hip today, apparently in direct communication with an ulceration, and implying septic joint.   Electronically Signed   By: Nelson Chimes M.D.   On: 09/02/2014 23:11   2. Dg Chest Port 1 View 09/02/2014 Enlargement of cardiac silhouette with minimal RIGHT basilar atelectasis.  Lytic bone lesion posterior RIGHT fifth rib, question  myeloma or a lytic osseous metastasis.   Electronically Signed   By: Lavonia Dana M.D.   On: 09/02/2014 17:10    Objective  Filed Vitals:   09/05/14 0035 09/05/14 0304 09/05/14 0415 09/05/14 0600  BP: 104/73 96/54 90/45  115/51  Pulse: 102 97 98 101  Temp: 98.8 F (37.1 C) 98.4 F (36.9 C) 98 F (36.7 C) 99.5  F (37.5 C)  TempSrc: Oral Oral Oral Oral  Resp: 19 19 19 18   Height:      Weight:      SpO2: 90% 98% 100% 93%    Intake/Output Summary (Last 24 hours) at 09/05/14 1210 Last data filed at 09/05/14 1000  Gross per 24 hour  Intake 3499.33 ml  Output   2750 ml  Net 749.33 ml   Filed Weights   09/03/14 0035  Weight: 96.5 kg (212 lb 11.9 oz)   Exam:  General:  NAD, pleasant   HEENT: no scleral icterus, PERRL  Cardiovascular: RRR without MRG, 2+ peripheral pulses, no edema  Respiratory: CTA biL, good air movement, no wheezing, no crackles, no rales  Abdomen: soft, non tender, BS +, no guarding  MSK/Extremities: no clubbing/cyanosis  Skin: no rashes  Neuro: extensive spasticity   Data Reviewed: Basic Metabolic Panel:  Recent Labs Lab 09/02/14 1918 09/03/14 0448 09/04/14 1216 09/05/14 0515  NA 136 137 135 131*  K 4.7 4.6 3.5 3.5  CL 101 103 104 104  CO2  --  26 23 21   GLUCOSE 128* 109* 148* 98  BUN 21 19 19 15   CREATININE 0.70 0.50 0.55 0.44*  CALCIUM  --  8.3* 7.9* 7.7*   CBC:  Recent Labs Lab 09/02/14 1905 09/02/14 1918 09/03/14 0448 09/04/14 1216 09/05/14 0515  WBC 17.8*  --  14.3* 13.5* 15.9*  NEUTROABS 14.0*  --   --   --   --   HGB 7.7* 10.2* 7.1* 6.5* 7.9*  HCT 26.7* 30.0* 24.4* 22.3* 26.4*  MCV 70.8*  --  71.1* 71.5* 72.7*  PLT 708*  --  574* 494* 438*   CBG: No results for input(s): GLUCAP in the last 168 hours.  Recent Results (from the past 240 hour(s))  Anaerobic culture     Status: None   Collection Time: 08/28/14  2:22 PM  Result Value Ref Range Status   Specimen Description WOUND RIGHT GROIN  Final   Special Requests NONE PART A   Final   Gram Stain   Final    RARE WBC PRESENT, PREDOMINANTLY PMN NO SQUAMOUS EPITHELIAL CELLS SEEN FEW GRAM POSITIVE COCCI IN PAIRS Performed at [facility]    Culture   Final    NO ANAEROBES ISOLATED Performed at [facility]    Report Status 09/04/2014 FINAL  Final  Wound  culture     Status: None   Collection Time: 08/28/14  2:22 PM  Result Value Ref Range Status   Specimen Description WOUND RIGHT GROIN  Final   Special Requests  PART A  Final   Gram Stain   Final    NO WBC SEEN NO SQUAMOUS EPITHELIAL CELLS SEEN NO ORGANISMS SEEN Performed at [facility]    Culture   Final    MULTIPLE ORGANISMS PRESENT, NONE PREDOMINANT Note: NO STAPHYLOCOCCUS AUREUS ISOLATED NO GROUP A STREP (S.PYOGENES) ISOLATED Performed at [facility]    Report Status 08/30/2014 FINAL  Final  Anaerobic culture     Status: None   Collection Time: 08/28/14  2:27  PM  Result Value Ref Range Status   Specimen Description TISSUE RIGHT GROIN  Final   Special Requests PART B  Final   Gram Stain   Final    NO WBC SEEN NO SQUAMOUS EPITHELIAL CELLS SEEN NO ORGANISMS SEEN Performed at [facility]    Culture   Final    NO ANAEROBES ISOLATED Performed at [facility]    Report Status 09/04/2014 FINAL  Final  Tissue culture     Status: None   Collection Time: 08/28/14  2:27 PM  Result Value Ref Range Status   Specimen Description TISSUE RIGHT GROIN  Final   Special Requests PART B  Final   Gram Stain   Final    RARE WBC PRESENT, PREDOMINANTLY PMN NO SQUAMOUS EPITHELIAL CELLS SEEN NO ORGANISMS SEEN Performed at [facility]    Culture   Final    MODERATE PSEUDOMONAS AERUGINOSA Performed at [facility]    Report Status 08/31/2014 FINAL  Final   Organism ID, Bacteria PSEUDOMONAS AERUGINOSA  Final      Susceptibility   Pseudomonas aeruginosa - MIC*    CEFEPIME 8 SENSITIVE Sensitive     CEFTAZIDIME 8 SENSITIVE Sensitive     CIPROFLOXACIN 1 SENSITIVE Sensitive     GENTAMICIN 8 INTERMEDIATE Intermediate     IMIPENEM <=0.25 SENSITIVE Sensitive     TOBRAMYCIN <=1 SENSITIVE Sensitive     * MODERATE PSEUDOMONAS AERUGINOSA  Surgical pcr screen     Status: None   Collection Time: 08/28/14  2:35 PM  Result Value Ref Range Status    MRSA, PCR NEGATIVE NEGATIVE Final   Staphylococcus aureus NEGATIVE NEGATIVE Final    Comment:        The Xpert SA Assay (FDA approved for NASAL specimens in patients over 37 years of age), is one component of a comprehensive surveillance program.  Test performance has been validated by [facility] for patients greater than or equal to 73 year old. It is not intended to diagnose infection nor to guide or monitor treatment.   Urine culture     Status: None (Preliminary result)   Collection Time: 09/02/14  4:46 PM  Result Value Ref Range Status   Specimen Description URINE, CLEAN CATCH  Final   Special Requests Normal  Final   Colony Count   Final    >=100,000 COLONIES/ML Performed at [facility]    Culture   Final    [place] Performed at [facility]    Report Status PENDING  Incomplete     Scheduled Meds: . sodium chloride   Intravenous Once  . sodium chloride   Intravenous Once  . baclofen  20 mg Oral QID  . ciprofloxacin  750 mg Oral BID  . darifenacin  15 mg Oral Daily  . enoxaparin (LOVENOX) injection  40 mg Subcutaneous QHS  . famotidine  20 mg Oral BID  . lactobacillus  1 g Oral TID WC  . lactose free nutrition  1 Container Oral TID BM  . metoCLOPramide  10 mg Oral TID AC  . metroNIDAZOLE  500 mg Oral 3 times per day  . multivitamin with minerals  1 tablet Oral q morning - 10a  . nutrition supplement (JUVEN)  1 packet Oral BID BM  . polyethylene glycol  17 g Oral Daily  . sodium hypochlorite   Irrigation q morning - 10a  . vitamin C  500 mg Oral q morning - 10a  .  zinc sulfate  220 mg Oral q morning - 10a   Continuous Infusions: . sodium chloride 1,000 mL (09/04/14 1941)    Marzetta Board, MD [facility] Pager [phone]. If 7 PM - 7 AM, please contact night-coverage at [email or web address], password [facility] 09/05/2014, 12:10 PM  LOS: 3 days
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
## 2014-09-05 NOTE — Progress Notes (Signed)
Pt placed on Auto CPAP 12-19 CMH20 via [facility].  Pt tolerating well at this time, RT to monitor and assess as needed.

## 2014-09-06 DIAGNOSIS — Z9359 Other cystostomy status: Secondary | ICD-10-CM

## 2014-09-06 LAB — TYPE AND SCREEN
ABO/RH(D): B POS
ANTIBODY SCREEN: NEGATIVE
UNIT DIVISION: 0
UNIT DIVISION: 0
Unit division: 0

## 2014-09-06 LAB — OCCULT BLOOD X 1 CARD TO LAB, STOOL: FECAL OCCULT BLD: POSITIVE — AB

## 2014-09-06 LAB — CBC
HCT: 28.1 % — ABNORMAL LOW (ref 39.0–52.0)
HEMOGLOBIN: 8.5 g/dL — AB (ref 13.0–17.0)
MCH: 22.1 pg — AB (ref 26.0–34.0)
MCHC: 30.2 g/dL (ref 30.0–36.0)
MCV: 73 fL — ABNORMAL LOW (ref 78.0–100.0)
Platelets: 434 10*3/uL — ABNORMAL HIGH (ref 150–400)
RBC: 3.85 MIL/uL — ABNORMAL LOW (ref 4.22–5.81)
RDW: 18 % — AB (ref 11.5–15.5)
WBC: 16.8 10*3/uL — ABNORMAL HIGH (ref 4.0–10.5)

## 2014-09-06 LAB — BASIC METABOLIC PANEL
ANION GAP: 5 (ref 5–15)
BUN: 16 mg/dL (ref 6–23)
CALCIUM: 7.8 mg/dL — AB (ref 8.4–10.5)
CO2: 25 mmol/L (ref 19–32)
CREATININE: 0.36 mg/dL — AB (ref 0.50–1.35)
Chloride: 104 mmol/L (ref 96–112)
GFR calc non Af Amer: >90 mL/min (ref >90)
Glucose, Bld: 105 mg/dL — ABNORMAL HIGH (ref 70–99)
Potassium: 3.4 mmol/L — ABNORMAL LOW (ref 3.5–5.1)
Sodium: 134 mmol/L — ABNORMAL LOW (ref 135–145)

## 2014-09-06 LAB — URINE CULTURE
Colony Count: >=100000
Special Requests: NORMAL

## 2014-09-06 MED ORDER — SODIUM CHLORIDE 0.9 % IJ SOLN
10.0000 mL | INTRAMUSCULAR | Status: DC | PRN
  Administered 2014-09-06 – 2014-09-08 (×3): 10 mL
  Filled 2014-09-06 (×3): qty 40

## 2014-09-06 MED ORDER — SIMETHICONE 80 MG PO CHEW
80.0000 mg | CHEWABLE_TABLET | Freq: Four times a day (QID) | ORAL | Status: DC | PRN
  Administered 2014-09-06: 80 mg via ORAL
  Filled 2014-09-06 (×2): qty 1

## 2014-09-06 MED ORDER — FUROSEMIDE 10 MG/ML IJ SOLN
20.0000 mg | Freq: Once | INTRAMUSCULAR | Status: AC
  Administered 2014-09-06: 20 mg via INTRAVENOUS
  Filled 2014-09-06: qty 2

## 2014-09-06 MED ORDER — IPRATROPIUM-ALBUTEROL 0.5-2.5 (3) MG/3ML IN SOLN: 3.0000 mL | Freq: Four times a day (QID) | RESPIRATORY_TRACT | Status: DC | PRN

## 2014-09-06 NOTE — Plan of Care (Signed)
Problem: Phase II Progression Outcomes Goal: Progress activity as tolerated unless otherwise ordered Outcome: Not Applicable Date Met:  49/97/18 quadriplegic

## 2014-09-06 NOTE — Progress Notes (Signed)
Pt for 2D Echo. Contacted Candace in vascular lab & she said the test would probably be done tomorrow. Mansel Strother, [employer]

## 2014-09-06 NOTE — Plan of Care (Signed)
Problem: Phase II Progression Outcomes Goal: Foley discontinued Outcome: Not Applicable Date Met:  93/96/88 Pt has chronic suprapubic Cath

## 2014-09-06 NOTE — Progress Notes (Signed)
PROGRESS NOTE  [name] [name]:[id number] DOB: 11/07/1965 DOA: 09/02/2014 PCP: [name], MD  HPI: 49 yo M with history of quadriplegia and multiple decubitus wounds, presented to the ED on 4/19 with fever and chills over the last 24 hours, with concerns for decubitus ulcer infections as well as a UTI.   Subjective / 24 H Interval events - feels more energetic today, appetite is better - denies abdominal pain, nausea - no chest pain/dyspnea  Assessment/Plan: Principal Problem:   Wound infection Active Problems:   Obesity   Quadriplegia   Seizure disorder   Sacral decubitus ulcer, stage IV   Microcytic anemia   S/P colostomy   Suprapubic catheter   Complicated UTI (urinary tract infection)   Chronic osteomyelitis, pelvic region and thigh   Severe protein-calorie malnutrition   Decubitus ulcer of ischium, stage 3   Decubitus ulcer of back, stage 3   Sacral decubitus ulcer   Lytic lesion of bone on x-ray   Osteomyelitis /wound infection with multiple sacral decubitus ulcers  - Overall very difficult situation, bone sample done by Dr. [name] during surgery for right groin grew Pseudomonas.  - It appears that there is communication with the right hip with gas present in the right hip per CT scan. Dr. Ronnie Derby from orthopedic surgery consulted and saw patient 4/20, no need for acute interventions - plastic surgery evaluated patient on 4/20, I have discussed with Dr. Iran Planas 4/21, patient will need to be referred to a tertiary center for more advanced surgical approach, likely multidisciplinary, this does not need to be done urgently and can be done as outpatient. I think Dr. [name] will try and set that up. - ID following, patient was transitioned to oral antibiotics 4/21, Dr. Megan Salon OK with intermittent low grade temp due to ongoing polymicrobial infection.  - febrile last night on 4/22, had chills, afebrile this morning. Discussed with Dr. Linus Salmons from ID today regarding  fevers, patient not hypotensive or septic appearing, will continue oral antibiotics and closely monitor. Will send blood cultures if febrile again - will obtain a 2D echo today given recurrent fevers.  - will need to continue his Ciprofloxacin and Metronidazole at least through ID appointment on 10/15/2014 with [name]  Lytic lesion posterior right 5th rib - obtained SPEP/UPEP, resulted, no evidence of monoclonal protein, UPEP final pending - will need further evaluation as an outpatient as well - he has an appointment with Dr. Burr Medico on 5/24  Microcytic anemia  - likely anemia of chronic disease, Hb 6.5 on 4/21, received 2U with improvement but lower as expected, s/p an additional unit 4/22, Hb 8.5 today.  - no evidence of GI bleed  - Difficult access, s/p PICC Line with IR.  Chronic quadriplegia - continue home medications  Complicated UTI vs colonization - antibiotics as above, urine culture resulted today and showed Pseudomonas and Enterobacter, OK with current antibiotic regimen per ID.  - ? Whether this is colonizer vs true UTI, favor colonozation  HTN  - soft, BPs, hold Norvasc for now   Diet: Diet regular Room service appropriate?: Yes; Fluid consistency:: Thin Fluids: NS DVT Prophylaxis: Lovenox  Code Status: Full Code Family Communication: no family bedside today  Disposition Plan: remain inpatient, home 1-2 days if afebrile and 2D echo OK  Consultants:  Plastic surgery  Orthopedic surgery   Infectious disease  Procedures:  None    Antibiotics Cefepime 4/20 >> 4/21 Vancomycin 4/20 >> 4/21  Ciprofloxacin 4/21 >> Metronidazole 4/21 >>  Studies  1. Ct Abdomen Pelvis Wo Contrast 09/02/2014 Small bilateral effusions with dependent atelectasis.  No acute abdominal organ pathology. No complications seen related to the suprapubic bladder catheter or the left abdominal ostomy.  Extensive sacral decubitus ulcers with chronic inflammatory changes of the bones of  the pelvis and destructive changes of the hips. Air/ gas present within the right hip today, apparently in direct communication with an ulceration, and implying septic joint.   Electronically Signed   By: Nelson Chimes M.D.   On: 09/02/2014 23:11   2. Dg Chest Port 1 View 09/02/2014 Enlargement of cardiac silhouette with minimal RIGHT basilar atelectasis.  Lytic bone lesion posterior RIGHT fifth rib, question myeloma or a lytic osseous metastasis.   Electronically Signed   By: Lavonia Dana M.D.   On: 09/02/2014 17:10    Objective  Filed Vitals:   09/05/14 2147 09/05/14 2311 09/06/14 0600 09/06/14 1107  BP:   146/68 97/67  Pulse: 108  88 97  Temp: 100.8 F (38.2 C) 99.6 F (37.6 C) 99.4 F (37.4 C) 98.6 F (37 C)  TempSrc: Oral  Axillary Oral  Resp: 28  20 18   Height:      Weight:      SpO2: 96%  100% 93%    Intake/Output Summary (Last 24 hours) at 09/06/14 1213 Last data filed at 09/06/14 1000  Gross per 24 hour  Intake 2782.17 ml  Output   4027 ml  Net -1244.83 ml   Filed Weights   09/03/14 0035  Weight: 96.5 kg (212 lb 11.9 oz)   Exam:  General:  NAD, pleasant   HEENT: no scleral icterus  Cardiovascular: RRR without MRG, 2+ peripheral pulses, no edema  Respiratory: CTA biL, good air movement, no wheezing, no crackles, no rales  Abdomen: soft, non tender, BS +, no guarding  MSK/Extremities: no clubbing/cyanosis, wounds not evaluated today   Skin: no rashes  Neuro: extensive spasticity   Data Reviewed: Basic Metabolic Panel:  Recent Labs Lab 09/02/14 1918 09/03/14 0448 09/04/14 1216 09/05/14 0515 09/06/14 0600  NA 136 137 135 131* 134*  K 4.7 4.6 3.5 3.5 3.4*  CL 101 103 104 104 104  CO2  --  26 23 21 25   GLUCOSE 128* 109* 148* 98 105*  BUN 21 19 19 15 16   CREATININE 0.70 0.50 0.55 0.44* 0.36*  CALCIUM  --  8.3* 7.9* 7.7* 7.8*   CBC:  Recent Labs Lab 09/02/14 1905 09/02/14 1918 09/03/14 0448 09/04/14 1216 09/05/14 0515 09/06/14 0600  WBC  17.8*  --  14.3* 13.5* 15.9* 16.8*  NEUTROABS 14.0*  --   --   --   --   --   HGB 7.7* 10.2* 7.1* 6.5* 7.9* 8.5*  HCT 26.7* 30.0* 24.4* 22.3* 26.4* 28.1*  MCV 70.8*  --  71.1* 71.5* 72.7* 73.0*  PLT 708*  --  574* 494* 438* 434*   CBG: No results for input(s): GLUCAP in the last 168 hours.  Recent Results (from the past 240 hour(s))  Anaerobic culture     Status: None   Collection Time: 08/28/14  2:22 PM  Result Value Ref Range Status   Specimen Description WOUND RIGHT GROIN  Final   Special Requests NONE PART A   Final   Gram Stain   Final    RARE WBC PRESENT, PREDOMINANTLY PMN NO SQUAMOUS EPITHELIAL CELLS SEEN FEW GRAM POSITIVE COCCI IN PAIRS Performed at [facility]    Culture   Final  NO ANAEROBES ISOLATED Performed at [facility]    Report Status 09/04/2014 FINAL  Final  Wound culture     Status: None   Collection Time: 08/28/14  2:22 PM  Result Value Ref Range Status   Specimen Description WOUND RIGHT GROIN  Final   Special Requests  PART A  Final   Gram Stain   Final    NO WBC SEEN NO SQUAMOUS EPITHELIAL CELLS SEEN NO ORGANISMS SEEN Performed at [facility]    Culture   Final    MULTIPLE ORGANISMS PRESENT, NONE PREDOMINANT Note: NO STAPHYLOCOCCUS AUREUS ISOLATED NO GROUP A STREP (S.PYOGENES) ISOLATED Performed at [facility]    Report Status 08/30/2014 FINAL  Final  Anaerobic culture     Status: None   Collection Time: 08/28/14  2:27 PM  Result Value Ref Range Status   Specimen Description TISSUE RIGHT GROIN  Final   Special Requests PART B  Final   Gram Stain   Final    NO WBC SEEN NO SQUAMOUS EPITHELIAL CELLS SEEN NO ORGANISMS SEEN Performed at [facility]    Culture   Final    NO ANAEROBES ISOLATED Performed at [facility]    Report Status 09/04/2014 FINAL  Final  Tissue culture     Status: None   Collection Time: 08/28/14  2:27 PM  Result Value Ref Range Status   Specimen Description  TISSUE RIGHT GROIN  Final   Special Requests PART B  Final   Gram Stain   Final    RARE WBC PRESENT, PREDOMINANTLY PMN NO SQUAMOUS EPITHELIAL CELLS SEEN NO ORGANISMS SEEN Performed at [facility]    Culture   Final    MODERATE PSEUDOMONAS AERUGINOSA Performed at [facility]    Report Status 08/31/2014 FINAL  Final   Organism ID, Bacteria PSEUDOMONAS AERUGINOSA  Final      Susceptibility   Pseudomonas aeruginosa - MIC*    CEFEPIME 8 SENSITIVE Sensitive     CEFTAZIDIME 8 SENSITIVE Sensitive     CIPROFLOXACIN 1 SENSITIVE Sensitive     GENTAMICIN 8 INTERMEDIATE Intermediate     IMIPENEM <=0.25 SENSITIVE Sensitive     TOBRAMYCIN <=1 SENSITIVE Sensitive     * MODERATE PSEUDOMONAS AERUGINOSA  Surgical pcr screen     Status: None   Collection Time: 08/28/14  2:35 PM  Result Value Ref Range Status   MRSA, PCR NEGATIVE NEGATIVE Final   Staphylococcus aureus NEGATIVE NEGATIVE Final    Comment:        The Xpert SA Assay (FDA approved for NASAL specimens in patients over 41 years of age), is one component of a comprehensive surveillance program.  Test performance has been validated by [facility] for patients greater than or equal to 48 year old. It is not intended to diagnose infection nor to guide or monitor treatment.   Urine culture     Status: None   Collection Time: 09/02/14  4:46 PM  Result Value Ref Range Status   Specimen Description URINE, CLEAN CATCH  Final   Special Requests Normal  Final   Colony Count   Final    >=100,000 COLONIES/ML Performed at [facility]    Culture   Final    ENTEROBACTER CLOACAE PSEUDOMONAS AERUGINOSA Performed at [facility]    Report Status 09/06/2014 FINAL  Final   Organism ID, Bacteria ENTEROBACTER CLOACAE  Final   Organism ID, Bacteria PSEUDOMONAS AERUGINOSA  Final  Susceptibility   Enterobacter cloacae - MIC*    CEFAZOLIN >=64 RESISTANT Resistant     CEFTRIAXONE >=64 RESISTANT Resistant       CIPROFLOXACIN <=0.25 SENSITIVE Sensitive     GENTAMICIN <=1 SENSITIVE Sensitive     LEVOFLOXACIN <=0.12 SENSITIVE Sensitive     NITROFURANTOIN 32 SENSITIVE Sensitive     TOBRAMYCIN <=1 SENSITIVE Sensitive     TRIMETH/SULFA <=20 SENSITIVE Sensitive     PIP/TAZO >=128 RESISTANT Resistant     * ENTEROBACTER CLOACAE   Pseudomonas aeruginosa - MIC*    CEFEPIME 16 INTERMEDIATE Intermediate     CEFTAZIDIME 32 RESISTANT Resistant     CIPROFLOXACIN 2 INTERMEDIATE Intermediate     GENTAMICIN 8 INTERMEDIATE Intermediate     IMIPENEM 2 SENSITIVE Sensitive     TOBRAMYCIN <=1 SENSITIVE Sensitive     * PSEUDOMONAS AERUGINOSA     Scheduled Meds: . baclofen  20 mg Oral QID  . ciprofloxacin  750 mg Oral BID  . darifenacin  15 mg Oral Daily  . enoxaparin (LOVENOX) injection  40 mg Subcutaneous QHS  . famotidine  20 mg Oral BID  . lactobacillus  1 g Oral TID WC  . lactose free nutrition  1 Container Oral TID BM  . metoCLOPramide  10 mg Oral TID AC  . metroNIDAZOLE  500 mg Oral 3 times per day  . multivitamin with minerals  1 tablet Oral q morning - 10a  . nutrition supplement (JUVEN)  1 packet Oral BID BM  . polyethylene glycol  17 g Oral Daily  . sodium hypochlorite   Irrigation q morning - 10a  . vitamin C  500 mg Oral q morning - 10a  . zinc sulfate  220 mg Oral q morning - 10a   Continuous Infusions: . sodium chloride 50 mL/hr at 09/06/14 0015    Marzetta Board, MD [facility] Pager [phone]. If 7 PM - 7 AM, please contact night-coverage at [email or web address], password [facility] 09/06/2014, 12:13 PM  LOS: 4 days

## 2014-09-07 DIAGNOSIS — R509 Fever, unspecified: Secondary | ICD-10-CM

## 2014-09-07 LAB — BASIC METABOLIC PANEL
Anion gap: 6 (ref 5–15)
BUN: 14 mg/dL (ref 6–23)
CALCIUM: 8.2 mg/dL — AB (ref 8.4–10.5)
CO2: 26 mmol/L (ref 19–32)
Chloride: 107 mmol/L (ref 96–112)
Creatinine, Ser: 0.36 mg/dL — ABNORMAL LOW (ref 0.50–1.35)
GFR calc Af Amer: >90 mL/min (ref >90)
GLUCOSE: 96 mg/dL (ref 70–99)
Potassium: 3.3 mmol/L — ABNORMAL LOW (ref 3.5–5.1)
Sodium: 139 mmol/L (ref 135–145)

## 2014-09-07 LAB — CBC
HCT: 28.3 % — ABNORMAL LOW (ref 39.0–52.0)
HEMOGLOBIN: 8.6 g/dL — AB (ref 13.0–17.0)
MCH: 22.4 pg — ABNORMAL LOW (ref 26.0–34.0)
MCHC: 30.4 g/dL (ref 30.0–36.0)
MCV: 73.7 fL — AB (ref 78.0–100.0)
Platelets: 428 10*3/uL — ABNORMAL HIGH (ref 150–400)
RBC: 3.84 MIL/uL — ABNORMAL LOW (ref 4.22–5.81)
RDW: 18.4 % — ABNORMAL HIGH (ref 11.5–15.5)
WBC: 13 10*3/uL — ABNORMAL HIGH (ref 4.0–10.5)

## 2014-09-07 NOTE — Progress Notes (Signed)
Placed patient on CPAP for the night.  Patient is tolerating well at this time. 

## 2014-09-07 NOTE — Progress Notes (Signed)
  Echocardiogram 2D Echocardiogram has been performed.  Lysle Rubens 09/07/2014, 11:25 AM

## 2014-09-07 NOTE — Progress Notes (Signed)
[facility] PROGRESS NOTE Interim History: 49 year old male with past medical history of quadriplegic and sacral decubitus ulcer became in with fever and chills he was started on bank and cefepime on 09/03/2014 D escalated to Cipro and Flagyl on 09/04/2014, orthopedic surgery Dr. Lorre Nick was consulted was outpatient and recommended plastic surgery consult. Dr. [name] perform a bone biopsy that grew Pseudomonas 4.14.2016. ID was consulted and recommended transition the patient to Cipro and Flagyl.   Assessment/Plan: Osteomyelitis/wound infection with multiple sacral decubitus ulcer: - It appears there is indication of the right hip with a gas present on CT scan for Dr. Lorre Nick orthopedics which was consulted on 09/03/2014. He recommended to consult plastic surgery who evaluated the patient on 09/03/2014 they recommended the patient be transferred to a tertiary center, this does not have to be urgent. And he could be done as an outpatient. Dr. [name] perform a biopsy on 08/28/2014. - ID was consulted who recommended to transition to oral antibiotics on 09/04/2014. - Has been spiking low temp fevers, I was discussed with Dr. Linus Salmons, That unless the patient becomes hypotensive we continue on oral antibiotics.   Lytic lesion posterior right 5th rib - obtained SPEP/UPEP, resulted, no evidence of monoclonal protein, UPEP final pending - he has an appointment with Dr. Burr Medico on 5/24  Microcytic anemia  - likely anemia of chronic disease. - no evidence of GI bleed  - Difficult access, s/p PICC Line with IR.  Chronic quadriplegia - continue home medications  Complicated UTI vs colonization - antibiotics as above, urine culture resulted today and showed Pseudomonas and Enterobacter, OK with current antibiotic regimen per ID.   Essential HTN  - soft, BPs, hold Norvasc for now - Resume As an Outpatient.  Code Status: Full Code Family Communication: no family bedside today  Disposition Plan:  remain inpatient, home 1 days if afebrile and 2D echo OK   Consultants:  Plastic surgery  Orthopedic surgery   Infectious disease  Procedures:  None   Antibiotics Cefepime 4/20 >> 4/21 Vancomycin 4/20 >> 4/21  Ciprofloxacin 4/21 >> Metronidazole 4/21 >>   Studies  1. Ct Abdomen Pelvis Wo Contrast 09/02/2014 Small bilateral effusions with dependent atelectasis. No acute abdominal organ pathology. No complications seen related to the suprapubic bladder catheter or the left abdominal ostomy. Extensive sacral decubitus ulcers with chronic inflammatory changes of the bones of the pelvis and destructive changes of the hips. Air/ gas present within the right hip today, apparently in direct communication with an ulceration, and implying septic joint. Electronically Signed By: Nelson Chimes M.D. On: 09/02/2014 23:11   2. Dg Chest Port 1 View 09/02/2014 Enlargement of cardiac silhouette with minimal RIGHT basilar atelectasis. Lytic bone lesion posterior RIGHT fifth rib, question myeloma or a lytic osseous metastasis. Electronically Signed By: Lavonia Dana M.D. On: 09/02/2014 17:10   HPI/Subjective: NO complains  Objective: Filed Vitals:   09/06/14 1548 09/06/14 1910 09/06/14 2215 09/07/14 0432  BP: 105/71 128/62 101/54 131/70  Pulse: 92 94 91 84  Temp: 99.9 F (37.7 C) 100.9 F (38.3 C) 99 F (37.2 C) 100.1 F (37.8 C)  TempSrc: Oral Axillary Oral Axillary  Resp: 20 20 20    Height:      Weight:      SpO2: 96% 93% 96% 98%    Intake/Output Summary (Last 24 hours) at 09/07/14 1058 Last data filed at 09/07/14 0910  Gross per 24 hour  Intake 2802.83 ml  Output   3685 ml  Net -882.17 ml  Filed Weights   09/03/14 0035  Weight: 96.5 kg (212 lb 11.9 oz)    Exam:  General: Alert, awake, oriented x3, in no acute distress.  HEENT: No bruits, no goiter.  Heart: Regular rate and rhythm. Lungs: Good air movement, clear Abdomen: Soft, nontender,  nondistended, positive bowel sounds.  Neuro: Grossly intact, nonfocal.   Data Reviewed: Basic Metabolic Panel:  Recent Labs Lab 09/03/14 0448 09/04/14 1216 09/05/14 0515 09/06/14 0600 09/07/14 0430  NA 137 135 131* 134* 139  K 4.6 3.5 3.5 3.4* 3.3*  CL 103 104 104 104 107  CO2 26 23 21 25 26   GLUCOSE 109* 148* 98 105* 96  BUN 19 19 15 16 14   CREATININE 0.50 0.55 0.44* 0.36* 0.36*  CALCIUM 8.3* 7.9* 7.7* 7.8* 8.2*   Liver Function Tests: No results for input(s): AST, ALT, ALKPHOS, BILITOT, PROT, ALBUMIN in the last 168 hours. No results for input(s): LIPASE, AMYLASE in the last 168 hours. No results for input(s): AMMONIA in the last 168 hours. CBC:  Recent Labs Lab 09/02/14 1905  09/03/14 0448 09/04/14 1216 09/05/14 0515 09/06/14 0600 09/07/14 0430  WBC 17.8*  --  14.3* 13.5* 15.9* 16.8* 13.0*  NEUTROABS 14.0*  --   --   --   --   --   --   HGB 7.7*  < > 7.1* 6.5* 7.9* 8.5* 8.6*  HCT 26.7*  < > 24.4* 22.3* 26.4* 28.1* 28.3*  MCV 70.8*  --  71.1* 71.5* 72.7* 73.0* 73.7*  PLT 708*  --  574* 494* 438* 434* 428*  < > = values in this interval not displayed. Cardiac Enzymes: No results for input(s): CKTOTAL, CKMB, CKMBINDEX, TROPONINI in the last 168 hours. BNP (last 3 results) No results for input(s): BNP in the last 8760 hours.  ProBNP (last 3 results) No results for input(s): PROBNP in the last 8760 hours.  CBG: No results for input(s): GLUCAP in the last 168 hours.  Recent Results (from the past 240 hour(s))  Anaerobic culture     Status: None   Collection Time: 08/28/14  2:22 PM  Result Value Ref Range Status   Specimen Description WOUND RIGHT GROIN  Final   Special Requests NONE PART A   Final   Gram Stain   Final    RARE WBC PRESENT, PREDOMINANTLY PMN NO SQUAMOUS EPITHELIAL CELLS SEEN FEW GRAM POSITIVE COCCI IN PAIRS Performed at [facility]    Culture   Final    NO ANAEROBES ISOLATED Performed at [facility]    Report Status  09/04/2014 FINAL  Final  Wound culture     Status: None   Collection Time: 08/28/14  2:22 PM  Result Value Ref Range Status   Specimen Description WOUND RIGHT GROIN  Final   Special Requests  PART A  Final   Gram Stain   Final    NO WBC SEEN NO SQUAMOUS EPITHELIAL CELLS SEEN NO ORGANISMS SEEN Performed at [facility]    Culture   Final    MULTIPLE ORGANISMS PRESENT, NONE PREDOMINANT Note: NO STAPHYLOCOCCUS AUREUS ISOLATED NO GROUP A STREP (S.PYOGENES) ISOLATED Performed at [facility]    Report Status 08/30/2014 FINAL  Final  Anaerobic culture     Status: None   Collection Time: 08/28/14  2:27 PM  Result Value Ref Range Status   Specimen Description TISSUE RIGHT GROIN  Final   Special Requests PART B  Final   Gram Stain   Final  NO WBC SEEN NO SQUAMOUS EPITHELIAL CELLS SEEN NO ORGANISMS SEEN Performed at [facility]    Culture   Final    NO ANAEROBES ISOLATED Performed at [facility]    Report Status 09/04/2014 FINAL  Final  Tissue culture     Status: None   Collection Time: 08/28/14  2:27 PM  Result Value Ref Range Status   Specimen Description TISSUE RIGHT GROIN  Final   Special Requests PART B  Final   Gram Stain   Final    RARE WBC PRESENT, PREDOMINANTLY PMN NO SQUAMOUS EPITHELIAL CELLS SEEN NO ORGANISMS SEEN Performed at [facility]    Culture   Final    MODERATE PSEUDOMONAS AERUGINOSA Performed at [facility]    Report Status 08/31/2014 FINAL  Final   Organism ID, Bacteria PSEUDOMONAS AERUGINOSA  Final      Susceptibility   Pseudomonas aeruginosa - MIC*    CEFEPIME 8 SENSITIVE Sensitive     CEFTAZIDIME 8 SENSITIVE Sensitive     CIPROFLOXACIN 1 SENSITIVE Sensitive     GENTAMICIN 8 INTERMEDIATE Intermediate     IMIPENEM <=0.25 SENSITIVE Sensitive     TOBRAMYCIN <=1 SENSITIVE Sensitive     * MODERATE PSEUDOMONAS AERUGINOSA  Surgical pcr screen     Status: None   Collection Time: 08/28/14  2:35 PM    Result Value Ref Range Status   MRSA, PCR NEGATIVE NEGATIVE Final   Staphylococcus aureus NEGATIVE NEGATIVE Final    Comment:        The Xpert SA Assay (FDA approved for NASAL specimens in patients over 49 years of age), is one component of a comprehensive surveillance program.  Test performance has been validated by [facility] for patients greater than or equal to 58 year old. It is not intended to diagnose infection nor to guide or monitor treatment.   Urine culture     Status: None   Collection Time: 09/02/14  4:46 PM  Result Value Ref Range Status   Specimen Description URINE, CLEAN CATCH  Final   Special Requests Normal  Final   Colony Count   Final    >=100,000 COLONIES/ML Performed at [facility]    Culture   Final    ENTEROBACTER CLOACAE PSEUDOMONAS AERUGINOSA Performed at [facility]    Report Status 09/06/2014 FINAL  Final   Organism ID, Bacteria ENTEROBACTER CLOACAE  Final   Organism ID, Bacteria PSEUDOMONAS AERUGINOSA  Final      Susceptibility   Enterobacter cloacae - MIC*    CEFAZOLIN >=64 RESISTANT Resistant     CEFTRIAXONE >=64 RESISTANT Resistant     CIPROFLOXACIN <=0.25 SENSITIVE Sensitive     GENTAMICIN <=1 SENSITIVE Sensitive     LEVOFLOXACIN <=0.12 SENSITIVE Sensitive     NITROFURANTOIN 32 SENSITIVE Sensitive     TOBRAMYCIN <=1 SENSITIVE Sensitive     TRIMETH/SULFA <=20 SENSITIVE Sensitive     PIP/TAZO >=128 RESISTANT Resistant     * ENTEROBACTER CLOACAE   Pseudomonas aeruginosa - MIC*    CEFEPIME 16 INTERMEDIATE Intermediate     CEFTAZIDIME 32 RESISTANT Resistant     CIPROFLOXACIN 2 INTERMEDIATE Intermediate     GENTAMICIN 8 INTERMEDIATE Intermediate     IMIPENEM 2 SENSITIVE Sensitive     TOBRAMYCIN <=1 SENSITIVE Sensitive     * PSEUDOMONAS AERUGINOSA  Culture, blood (single)     Status: None (Preliminary result)   Collection Time: 09/05/14  9:25 PM  Result Value Ref Range Status  Specimen Description BLOOD LEFT HAND   Final   Special Requests BOTTLES DRAWN AEROBIC ONLY 5CC  Final   Culture   Final           BLOOD CULTURE RECEIVED NO GROWTH TO DATE CULTURE WILL BE HELD FOR 5 DAYS BEFORE ISSUING A FINAL NEGATIVE REPORT Performed at [facility]    Report Status PENDING  Incomplete  Culture, blood (single)     Status: None (Preliminary result)   Collection Time: 09/05/14  9:30 PM  Result Value Ref Range Status   Specimen Description BLOOD RIGHT HAND  Final   Special Requests BOTTLES DRAWN AEROBIC ONLY 5CC  Final   Culture   Final           BLOOD CULTURE RECEIVED NO GROWTH TO DATE CULTURE WILL BE HELD FOR 5 DAYS BEFORE ISSUING A FINAL NEGATIVE REPORT Performed at [facility]    Report Status PENDING  Incomplete     Studies: Dg Chest Port 1 View  09/06/2014   CLINICAL DATA:  Acute onset of shortness of breath. Initial encounter.  EXAM: PORTABLE CHEST - 1 VIEW  COMPARISON:  Chest radiograph performed 09/02/2014  FINDINGS: The lungs are well-aerated. Small bilateral pleural effusions are seen, with underlying vascular congestion and mildly increased interstitial markings, possibly reflecting mild interstitial edema. No pneumothorax is identified.  The cardiomediastinal silhouette is mildly enlarged. No acute osseous abnormalities are seen. A right-sided IJ line is noted ending about the mid to distal SVC. Cervical spinal fusion hardware is partially characterized.  IMPRESSION: Small bilateral pleural effusions, with underlying vascular congestion and mild interstitial markings, possibly reflecting mild interstitial edema. Mild cardiomegaly noted.   Electronically Signed   By: Garald Balding M.D.   On: 09/06/2014 01:03    Scheduled Meds: . baclofen  20 mg Oral QID  . ciprofloxacin  750 mg Oral BID  . darifenacin  15 mg Oral Daily  . enoxaparin (LOVENOX) injection  40 mg Subcutaneous QHS  . famotidine  20 mg Oral BID  . lactobacillus  1 g Oral TID WC  . lactose free nutrition  1 Container  Oral TID BM  . metoCLOPramide  10 mg Oral TID AC  . metroNIDAZOLE  500 mg Oral 3 times per day  . multivitamin with minerals  1 tablet Oral q morning - 10a  . nutrition supplement (JUVEN)  1 packet Oral BID BM  . polyethylene glycol  17 g Oral Daily  . sodium hypochlorite   Irrigation q morning - 10a  . vitamin C  500 mg Oral q morning - 10a  . zinc sulfate  220 mg Oral q morning - 10a   Continuous Infusions: . sodium chloride 50 mL/hr at 09/06/14 1945    Time Spent: 15 min   Charlynne Cousins  [facility] Pager [phone]. If 7PM-7AM, please contact night-coverage at [email or web address], password [facility] 09/07/2014, 10:58 AM  LOS: 5 days

## 2014-09-07 NOTE — Progress Notes (Signed)
RT placed patient on CPAP. Setting is auto 12-19 cmH2O. Sterile water added to water chamber for humidification. Patient is tolerating well. RT will continue to monitor as needed.

## 2014-09-07 NOTE — Plan of Care (Signed)
Problem: Phase III Progression Outcomes Goal: Activity at appropriate level-compared to baseline (UP IN CHAIR FOR HEMODIALYSIS)  Outcome: Not Applicable Date Met:  02/20/37 quadriplegic Goal: Voiding independently Outcome: Not Applicable Date Met:  78/28/07 Suprapubic catheter

## 2014-09-07 NOTE — Plan of Care (Signed)
Problem: Phase III Progression Outcomes Goal: Foley discontinued Outcome: Not Applicable Date Met:  28/24/17 Suprapubic catheter to stay in

## 2014-09-08 DIAGNOSIS — E669 Obesity, unspecified: Secondary | ICD-10-CM

## 2014-09-08 LAB — UIFE/LIGHT CHAINS/TP QN, 24-HR UR
ALBUMIN, U: DETECTED
ALPHA 1 UR: DETECTED — AB
Alpha 2, Urine: DETECTED — AB
BETA UR: DETECTED — AB
Gamma Globulin, Urine: DETECTED — AB
Total Protein, Urine: 47 mg/dL — ABNORMAL HIGH (ref 5–25)

## 2014-09-08 MED ORDER — CIPROFLOXACIN HCL 750 MG PO TABS: 750.0000 mg | ORAL_TABLET | Freq: Two times a day (BID) | ORAL | Status: DC

## 2014-09-08 MED ORDER — METRONIDAZOLE 500 MG PO TABS: 500.0000 mg | ORAL_TABLET | Freq: Three times a day (TID) | ORAL | Status: DC

## 2014-09-08 NOTE — Plan of Care (Signed)
Problem: Phase III Progression Outcomes Goal: Activity at appropriate level-compared to baseline (UP IN CHAIR FOR HEMODIALYSIS)  Outcome: Completed/Met Date Met:  09/08/14 Pt is paralyzed but is normal to his baseline.  Goal: Voiding independently Outcome: Completed/Met Date Met:  09/08/14 Pt has suprapubic catheter.

## 2014-09-08 NOTE — Discharge Summary (Signed)
Physician Discharge Summary  [name] [name] [id number] DOB: 09-Aug-1965 DOA: 09/02/2014  PCP: [name], MD  Admit date: 09/02/2014 Discharge date: 09/08/2014  Time spent: 35 minutes  Recommendations for Outpatient Follow-up:  1. Prior to discharge home health services for RN to provide wound care 2. Follow-up on CBC and BMP on hospital follow-up visit  Discharge Diagnoses:  Principal Problem:   Wound infection Active Problems:   Obesity   Quadriplegia   Seizure disorder   Sacral decubitus ulcer, stage IV   Microcytic anemia   S/P colostomy   Suprapubic catheter   Complicated UTI (urinary tract infection)   Chronic osteomyelitis, pelvic region and thigh   Severe protein-calorie malnutrition   Decubitus ulcer of ischium, stage 3   Decubitus ulcer of back, stage 3   Sacral decubitus ulcer   Lytic lesion of bone on x-ray   Discharge Condition: Stable  Diet recommendation: Heart healthy  Filed Weights   09/03/14 0035 09/08/14 0332  Weight: 96.5 kg (212 lb 11.9 oz) 99.791 kg (220 lb)    History of present illness:  [name] [name] is a 49 y.o. male with Quadriplegia and Multiple Decubitus wounds who presents to the ED with complaints of fevers and chills over the past 24 hours and results of his Right Groin/pelvic wound culture which were positive for Pseudomonas. In the ED ,his wounds was found to have foul smelling drainage. He was also Found to have a UTI and was placed on IV Zosyn in the ED and referred for medical admission.   Hospital Course:  Patient is a pleasant 49 year old general with a past medical history of quadriplegia, decubitus ulcers, history of right groin ulcers undergoing surgical debridement by Dr. [name] on 08/28/2014, admitted to the medicine service on 09/02/2014 with he presented with fevers and chills. Initial workup included a CT scan of abdomen and pelvis performed on 09/02/2014 that showed extensive sacral decubitus ulcers with chronic  inflammatory changes of the bones of the pelvis and destructive changes of the hips. Given the possibility of septic joint involving right hip Dr. Vickey Huger of orthopedic surgery was consulted. It was felt that further surgical intervention should take place at a tertiary care facility but that at this point no orthopedic intervention was necessary. It was felt that decubitus does communicate with the right hip joint however right hip joint did not appear to be overtly infected. From an ID standpoint he was treated with IV cefepime, vancomycin and Flagyl. Patient was evaluated by Dr. Megan Salon of infectious disease. On 09/04/2014 patient was transition to oral antimicrobial therapy with ciprofloxacin and Flagyl. Patient showed gradual clinical improvement. By 09/08/2014 he had been afebrile for 24 hours. He was discharged home in stable condition. Home health services were ordered for wound care. She was given a one-month supply of oral ciprofloxacin and Flagyl. Have set up a follow-up appointment with infectious disease in 2 weeks.    Consultations:  Orthopedic surgery  Plastic surgery  Infectious disease  Discharge Exam: Filed Vitals:   09/08/14 1000  BP: 98/53  Pulse: 93  Temp: 98.5 F (36.9 C)  Resp: 20    General: Alert, awake, oriented x3, in no acute distress.  HEENT: No bruits, no goiter.  Heart: Regular rate and rhythm. Lungs: Good air movement, clear Abdomen: Soft, nontender, nondistended, positive bowel sounds.  Neuro: Grossly intact, nonfocal. Musculoskeletal: Patient having a right groin wound that is packed, I do not observe evidence of infection, there is no purulence.  Discharge  Instructions   Discharge Instructions    Call MD for:  difficulty breathing, headache or visual disturbances    Complete by:  As directed      Call MD for:  extreme fatigue    Complete by:  As directed      Call MD for:  hives    Complete by:  As directed      Call MD for:   persistant dizziness or light-headedness    Complete by:  As directed      Call MD for:  persistant nausea and vomiting    Complete by:  As directed      Call MD for:  redness, tenderness, or signs of infection (pain, swelling, redness, odor or green/yellow discharge around incision site)    Complete by:  As directed      Call MD for:  severe uncontrolled pain    Complete by:  As directed      Call MD for:  temperature >100.4    Complete by:  As directed      Diet - low sodium heart healthy    Complete by:  As directed      Increase activity slowly    Complete by:  As directed           Current Discharge Medication List    START taking these medications   Details  ciprofloxacin (CIPRO) 750 MG tablet Take 1 tablet (750 mg total) by mouth 2 (two) times daily. Qty: 60 tablet, Refills: 1    metroNIDAZOLE (FLAGYL) 500 MG tablet Take 1 tablet (500 mg total) by mouth 3 (three) times daily. Qty: 90 tablet, Refills: 1      CONTINUE these medications which have NOT CHANGED   Details  acetaminophen (TYLENOL) 500 MG tablet Take 1,000 mg by mouth every 6 (six) hours as needed (pain).     baclofen (LIORESAL) 20 MG tablet Take 20 mg by mouth 4 (four) times daily.     docusate sodium (COLACE) 100 MG capsule Take 100 mg by mouth 2 (two) times daily as needed for mild constipation.     famotidine (PEPCID) 20 MG tablet Take 20 mg by mouth 2 (two) times daily.      feeding supplement (BOOST HIGH PROTEIN) LIQD Take 1 Container by mouth 3 (three) times daily between meals.    lactobacillus (FLORANEX/LACTINEX) PACK Take 1 packet (1 g total) by mouth 3 (three) times daily with meals. Qty: 30 packet, Refills: 0    meloxicam (MOBIC) 7.5 MG tablet Take 7.5 mg by mouth 2 (two) times daily as needed for pain.    metoCLOPramide (REGLAN) 10 MG tablet Take 1 tablet (10 mg total) by mouth 3 (three) times daily before meals. Qty: 90 tablet, Refills: 0    Multiple Vitamin (MULTIVITAMIN WITH MINERALS)  TABS Take 1 tablet by mouth every morning.     nutrition supplement, JUVEN, (JUVEN) PACK Take 1 packet by mouth 2 (two) times daily between meals. Refills: 0    Oxycodone HCl 10 MG TABS Take 10 mg by mouth every 6 (six) hours as needed (for pain).    polyethylene glycol (MIRALAX / GLYCOLAX) packet Take 17 g by mouth daily. Qty: 14 each, Refills: 0    potassium chloride SA (K-DUR,KLOR-CON) 20 MEQ tablet Take 2 tablets (40 mEq total) by mouth daily as needed.    VESICARE 10 MG tablet Take 10 mg by mouth daily. Refills: 6    vitamin C (ASCORBIC ACID) 500 MG tablet Take  500 mg by mouth every morning.     zinc sulfate 220 MG capsule Take 220 mg by mouth every morning.    collagenase (SANTYL) ointment Apply 1 application topically daily. Qty: 15 g, Refills: 0      STOP taking these medications     amLODipine (NORVASC) 2.5 MG tablet      furosemide (LASIX) 20 MG tablet        Allergies  Allergen Reactions  . Ditropan [Oxybutynin] Other (See Comments)    hallucinations   Follow-up Information    Follow up with [name], MD In 1 week.   Specialty:  Internal Medicine   Contact information:   [address] [place] [address] [phone]       Follow up with Scharlene Gloss, MD.   Specialty:  Infectious Diseases   Contact information:   [address] [address] [phone]       Follow up with [facility], DO In 2 weeks.   Specialty:  Plastic Surgery   Contact information:   [place] [address] [phone]        The results of significant diagnostics from this hospitalization (including imaging, microbiology, ancillary and laboratory) are listed below for reference.    Significant Diagnostic Studies: Ct Abdomen Pelvis Wo Contrast  09/02/2014   CLINICAL DATA:  Woke up today with chills, fever and nausea. Infected wound. Suprapubic catheter an colostomy. Quadriplegia.  EXAM: CT ABDOMEN AND  PELVIS WITHOUT CONTRAST  TECHNIQUE: Multidetector CT imaging of the abdomen and pelvis was performed following the standard protocol without IV contrast.  COMPARISON:  04/17/2014  FINDINGS: There are small pleural effusions layering dependently. Mild dependent pulmonary atelectasis, left more than right. Probable mild chronic bronchiectasis in the left lower lobe.  The liver has a normal appearance without contrast. No calcified gallstones. The spleen is normal. The pancreas is normal. The adrenal glands are normal. The kidneys are normal. No cyst, mass, stone or hydronephrosis. The aorta and IVC are normal. No retroperitoneal mass or adenopathy. No free intraperitoneal fluid or air. There is an ostomy in the left lower quadrant. No complications seen relative to that. There is a suprapubic tube. There is no evidence of intra abdominal abscess or complication. The appendix is normal. There are chronic pressure ulcerations/sacral decubital ulceration and there are chronic bony changes of the pelvis and hip related to chronic inflammation. This includes the right hip joint which shows open communication with air within the joint.  IMPRESSION: Small bilateral effusions with dependent atelectasis.  No acute abdominal organ pathology. No complications seen related to the suprapubic bladder catheter or the left abdominal ostomy.  Extensive sacral decubitus ulcers with chronic inflammatory changes of the bones of the pelvis and destructive changes of the hips. Air/ gas present within the right hip today, apparently in direct communication with an ulceration, and implying septic joint.   Electronically Signed   By: Nelson Chimes M.D.   On: 09/02/2014 23:11   Dg Chest Port 1 View  09/06/2014   CLINICAL DATA:  Acute onset of shortness of breath. Initial encounter.  EXAM: PORTABLE CHEST - 1 VIEW  COMPARISON:  Chest radiograph performed 09/02/2014  FINDINGS: The lungs are well-aerated. Small bilateral pleural effusions are  seen, with underlying vascular congestion and mildly increased interstitial markings, possibly reflecting mild interstitial edema. No pneumothorax is identified.  The cardiomediastinal silhouette is mildly enlarged. No acute osseous abnormalities are seen. A right-sided IJ line  is noted ending about the mid to distal SVC. Cervical spinal fusion hardware is partially characterized.  IMPRESSION: Small bilateral pleural effusions, with underlying vascular congestion and mild interstitial markings, possibly reflecting mild interstitial edema. Mild cardiomegaly noted.   Electronically Signed   By: Garald Balding M.D.   On: 09/06/2014 01:03   Dg Chest Port 1 View  09/02/2014   CLINICAL DATA:  Hypertension, awoke with chills, fever and nausea today, had incision and debridement yesterday, quadriplegic, hypertension, GERD  EXAM: PORTABLE CHEST - 1 VIEW  COMPARISON:  Portable exam 1655 hours compared to 07/15/2014  FINDINGS: Enlargement of cardiac silhouette.  Slightly prominent superior mediastinum unchanged.  Pulmonary vascularity normal.  Minimal RIGHT basilar atelectasis.  Lungs otherwise clear.  No pleural effusion or pneumothorax.  Lytic bone lesion 4.4 cm length posterior RIGHT fifth rib.  Exophytic sclerotic bone lesion versus extrapleural density lateral mid LEFT chest unchanged.  No other definite focal osseous abnormalities.  IMPRESSION: Enlargement of cardiac silhouette with minimal RIGHT basilar atelectasis.  Lytic bone lesion posterior RIGHT fifth rib, question myeloma or a lytic osseous metastasis.   Electronically Signed   By: Lavonia Dana M.D.   On: 09/02/2014 17:10    Microbiology: Recent Results (from the past 240 hour(s))  Urine culture     Status: None   Collection Time: 09/02/14  4:46 PM  Result Value Ref Range Status   Specimen Description URINE, CLEAN CATCH  Final   Special Requests Normal  Final   Colony Count   Final    >=100,000 COLONIES/ML Performed at [facility]     Culture   Final    ENTEROBACTER CLOACAE PSEUDOMONAS AERUGINOSA Performed at [facility]    Report Status 09/06/2014 FINAL  Final   Organism ID, Bacteria ENTEROBACTER CLOACAE  Final   Organism ID, Bacteria PSEUDOMONAS AERUGINOSA  Final      Susceptibility   Enterobacter cloacae - MIC*    CEFAZOLIN >=64 RESISTANT Resistant     CEFTRIAXONE >=64 RESISTANT Resistant     CIPROFLOXACIN <=0.25 SENSITIVE Sensitive     GENTAMICIN <=1 SENSITIVE Sensitive     LEVOFLOXACIN <=0.12 SENSITIVE Sensitive     NITROFURANTOIN 32 SENSITIVE Sensitive     TOBRAMYCIN <=1 SENSITIVE Sensitive     TRIMETH/SULFA <=20 SENSITIVE Sensitive     PIP/TAZO >=128 RESISTANT Resistant     * ENTEROBACTER CLOACAE   Pseudomonas aeruginosa - MIC*    CEFEPIME 16 INTERMEDIATE Intermediate     CEFTAZIDIME 32 RESISTANT Resistant     CIPROFLOXACIN 2 INTERMEDIATE Intermediate     GENTAMICIN 8 INTERMEDIATE Intermediate     IMIPENEM 2 SENSITIVE Sensitive     TOBRAMYCIN <=1 SENSITIVE Sensitive     * PSEUDOMONAS AERUGINOSA  Culture, blood (single)     Status: None (Preliminary result)   Collection Time: 09/05/14  9:25 PM  Result Value Ref Range Status   Specimen Description BLOOD LEFT HAND  Final   Special Requests BOTTLES DRAWN AEROBIC ONLY 5CC  Final   Culture   Final           BLOOD CULTURE RECEIVED NO GROWTH TO DATE CULTURE WILL BE HELD FOR 5 DAYS BEFORE ISSUING A FINAL NEGATIVE REPORT Performed at [facility]    Report Status PENDING  Incomplete  Culture, blood (single)     Status: None (Preliminary result)   Collection Time: 09/05/14  9:30 PM  Result Value Ref Range Status   Specimen Description BLOOD RIGHT HAND  Final  Special Requests BOTTLES DRAWN AEROBIC ONLY 5CC  Final   Culture   Final           BLOOD CULTURE RECEIVED NO GROWTH TO DATE CULTURE WILL BE HELD FOR 5 DAYS BEFORE ISSUING A FINAL NEGATIVE REPORT Performed at [facility]    Report Status PENDING  Incomplete      Labs: Basic Metabolic Panel:  Recent Labs Lab 09/03/14 0448 09/04/14 1216 09/05/14 0515 09/06/14 0600 09/07/14 0430  NA 137 135 131* 134* 139  K 4.6 3.5 3.5 3.4* 3.3*  CL 103 104 104 104 107  CO2 26 23 21 25 26   GLUCOSE 109* 148* 98 105* 96  BUN 19 19 15 16 14   CREATININE 0.50 0.55 0.44* 0.36* 0.36*  CALCIUM 8.3* 7.9* 7.7* 7.8* 8.2*   Liver Function Tests: No results for input(s): AST, ALT, ALKPHOS, BILITOT, PROT, ALBUMIN in the last 168 hours. No results for input(s): LIPASE, AMYLASE in the last 168 hours. No results for input(s): AMMONIA in the last 168 hours. CBC:  Recent Labs Lab 09/02/14 1905  09/03/14 0448 09/04/14 1216 09/05/14 0515 09/06/14 0600 09/07/14 0430  WBC 17.8*  --  14.3* 13.5* 15.9* 16.8* 13.0*  NEUTROABS 14.0*  --   --   --   --   --   --   HGB 7.7*  < > 7.1* 6.5* 7.9* 8.5* 8.6*  HCT 26.7*  < > 24.4* 22.3* 26.4* 28.1* 28.3*  MCV 70.8*  --  71.1* 71.5* 72.7* 73.0* 73.7*  PLT 708*  --  574* 494* 438* 434* 428*  < > = values in this interval not displayed. Cardiac Enzymes: No results for input(s): CKTOTAL, CKMB, CKMBINDEX, TROPONINI in the last 168 hours. BNP: BNP (last 3 results) No results for input(s): BNP in the last 8760 hours.  ProBNP (last 3 results) No results for input(s): PROBNP in the last 8760 hours.  CBG: No results for input(s): GLUCAP in the last 168 hours.     SignedKelvin Cellar  [facility] 09/08/2014, 2:14 PM

## 2014-09-12 LAB — CULTURE, BLOOD (SINGLE)
CULTURE: NO GROWTH
CULTURE: NO GROWTH

## 2014-09-15 ENCOUNTER — Encounter (HOSPITAL_BASED_OUTPATIENT_CLINIC_OR_DEPARTMENT_OTHER): Payer: Medicare Other | Attending: Plastic Surgery

## 2014-09-15 DIAGNOSIS — L89893 Pressure ulcer of other site, stage 3: Secondary | ICD-10-CM | POA: Diagnosis not present

## 2014-09-15 DIAGNOSIS — L89892 Pressure ulcer of other site, stage 2: Secondary | ICD-10-CM | POA: Diagnosis not present

## 2014-09-15 DIAGNOSIS — L89102 Pressure ulcer of unspecified part of back, stage 2: Secondary | ICD-10-CM | POA: Insufficient documentation

## 2014-09-15 DIAGNOSIS — L89153 Pressure ulcer of sacral region, stage 3: Secondary | ICD-10-CM | POA: Diagnosis not present

## 2014-09-17 ENCOUNTER — Ambulatory Visit: Payer: Medicare Other | Admitting: Infectious Diseases

## 2014-09-21 IMAGING — CR DG CHEST 2V
3 series · 3 of 3 positions shown · non-contrast
Comparison: 01/17/2012

CLINICAL DATA: Verify PICC line placement

CHEST - 2 VIEW

[x chest ap (1 of 2)]
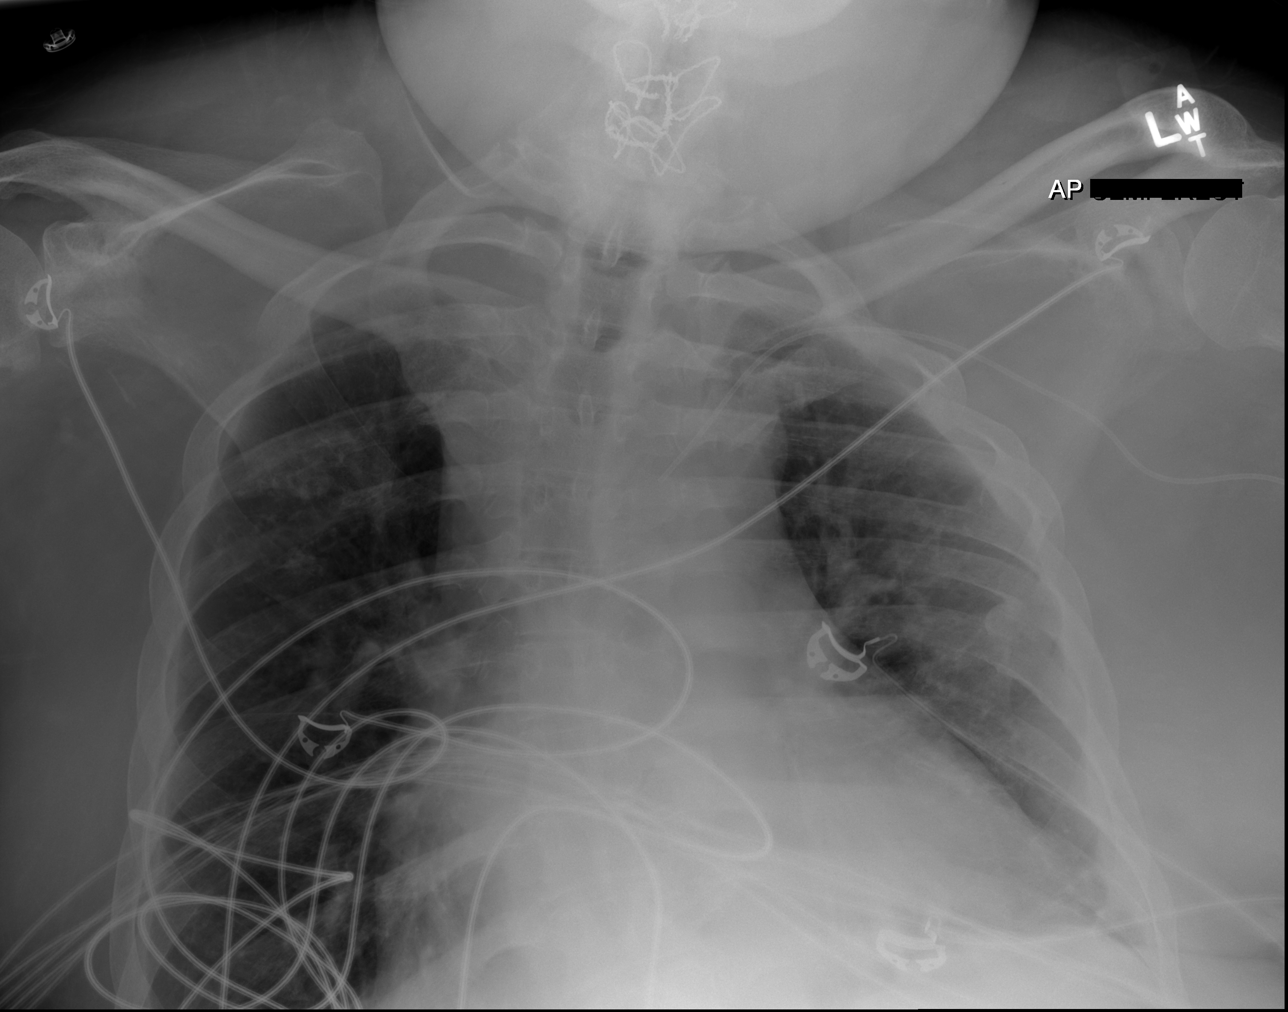

[x chest ap (2 of 2)]
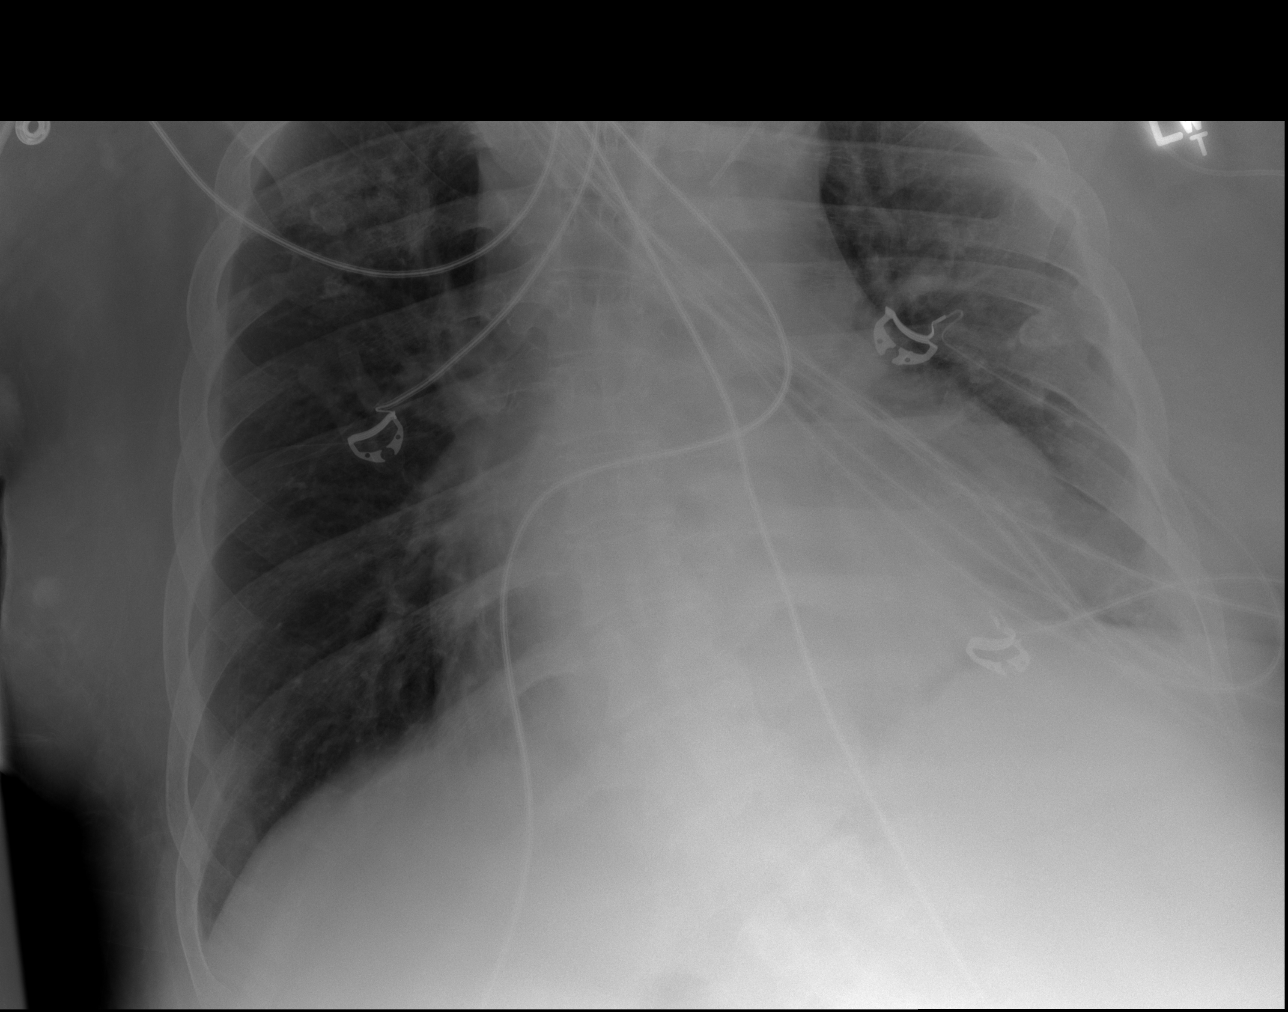

[w chest lat]
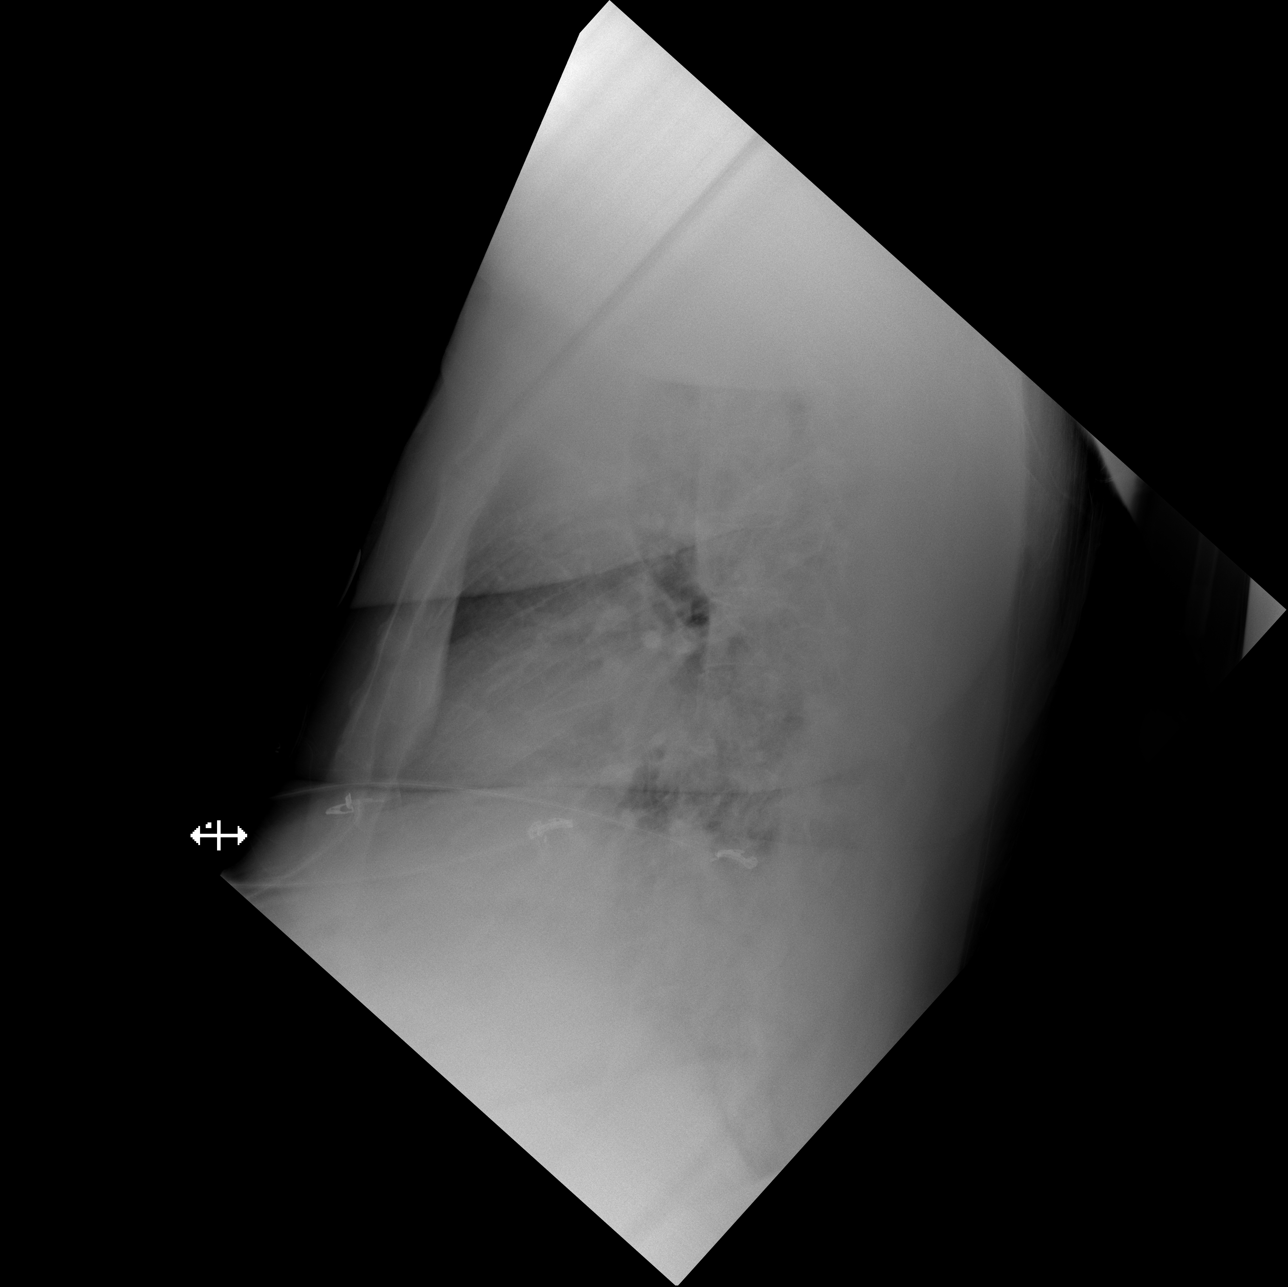

[3 of 3 positions shown; findings below may reference images not displayed]

FINDINGS: PICC line from left arm approach lies with its tip in the
left innominate vein and needs to be advanced 10-11 cm.  Recommend
advancement and re-imaging.

The heart is enlarged.  Right upper lobe ill-defined lesion appears
stable.  There is no pneumothorax or effusion.  No other pulmonary
opacities.  Previous  right internal jugular central line no longer
visible. Previous neck surgery.
IMPRESSION: PICC line from left arm approach lies with its tip in the left
innominate vein and needs to be advanced 10-11 cm. to be at the
cavoatrial junction.

## 2014-09-22 IMAGING — XA IR FLUORO GUIDE CV LINE*L*
1 series · 2 of 2 positions shown · non-contrast
Comparison: none

CLINICAL DATA: Poor venous access, need for IV antibiotics,
recently placed left upper extremity PICC line has been displaced

[Series 300: line placements · 2 of 2 slices shown]
[im 1/2]
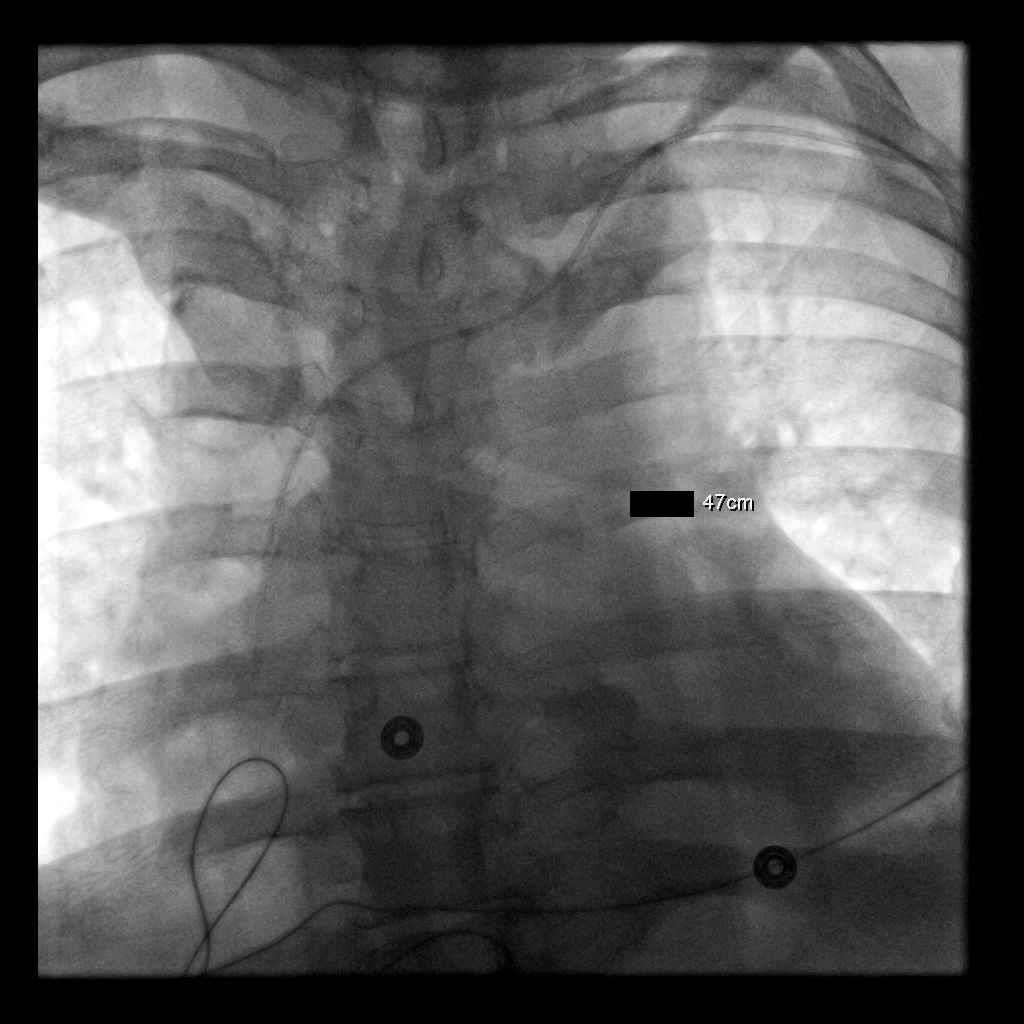
[im 2/2]
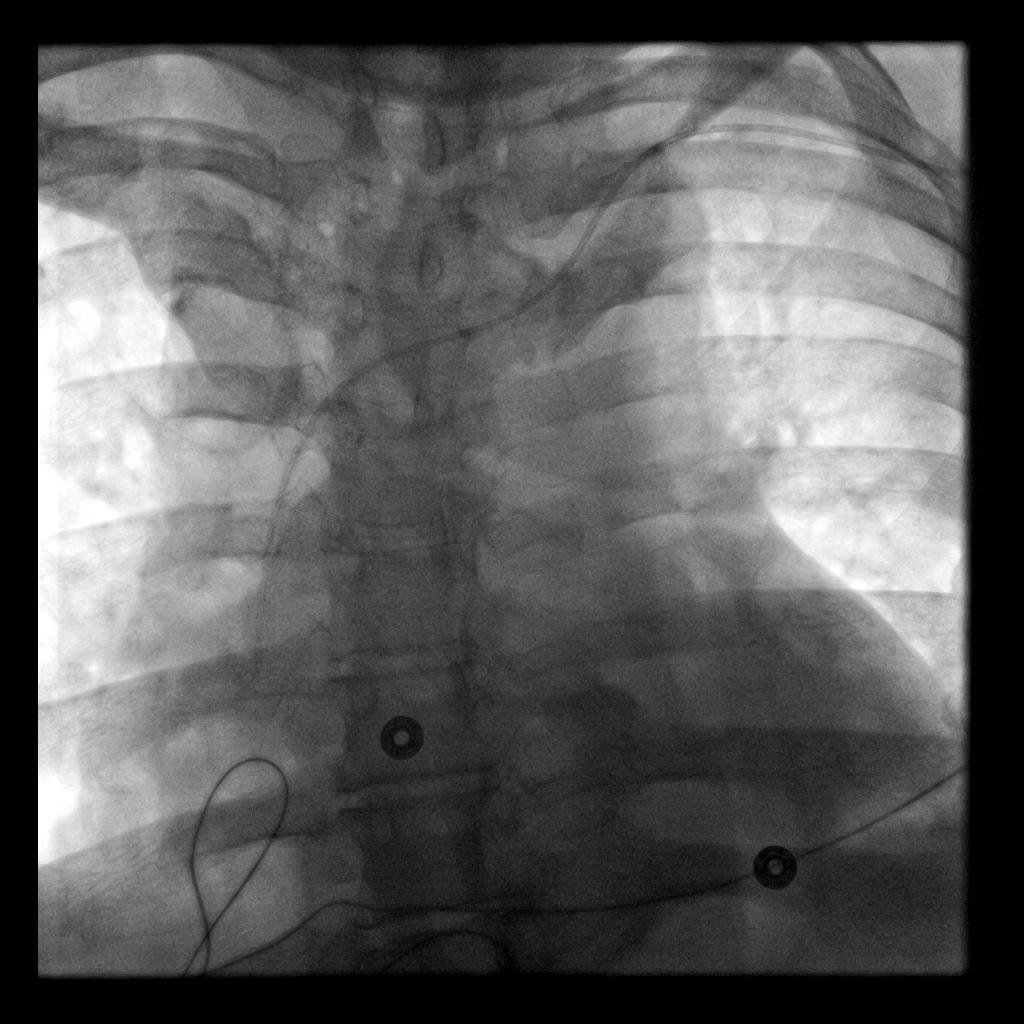

[2 of 2 positions shown; findings below may reference images not displayed]

PICC LINE PLACEMENT WITH ULTRASOUND AND FLUOROSCOPIC  GUIDANCE

The left arm was prepped with chlorhexidine, draped in the usual
sterile fashion using maximum barrier technique (cap and mask,
sterile gown, sterile gloves, large sterile sheet, hand hygiene and
cutaneous antisepsis) and infiltrated locally with 1% Lidocaine.

The existing PICC was retracted, cut, and a guidewire was placed
using fluroscopy to confirm intravenous location. The original PICC
was then completely removed and a new peel-a-way sheath was placed
over the guidewire, through which a five French single lumen PICC
trimmed to 47 cm was advanced, positioned with its tip at the lower
SVC/right atrial junction.  Fluoroscopy during the procedure and
fluoro spot radiograph confirms appropriate catheter position.  The
catheter was flushed, secured to the skin with Prolene sutures, and
covered with a sterile dressing.

Complications:  None
IMPRESSION: Successful left arm PICC line exchange using fluoroscopic guidance.
The catheter is ready for use.

Read by Rylee Maignan

## 2014-10-02 NOTE — Discharge Summary (Signed)
Physician Discharge Summary  Patient ID: [name] [name] MRN: [id number] DOB/AGE: [birth year] 49 y.o.  Admit date: 08/28/2014 Discharge date: 10/02/2014  Admission Diagnoses:  Discharge Diagnoses:  Active Problems:   Right groin ulcer   Discharged Condition: good  Hospital Course: The patient was take to the OR and underwent debridement.  He was managed on the medical surgical floor after surgery.  Consults: None  Significant Diagnostic Studies: none  Treatments: surgery  Discharge Exam: Blood pressure 132/73, pulse 110, temperature 99.6 F (37.6 C), temperature source Oral, resp. rate 18, height 5\' 11"  (1.803 m), weight 99.338 kg (219 lb), SpO2 96 %. The incision / wound site had the dressing in place.  Disposition: 06-Home-Health Care Svc     Medication List    STOP taking these medications        amLODipine 2.5 MG tablet  Commonly known as:  NORVASC     cefdinir 300 MG capsule  Commonly known as:  OMNICEF     furosemide 20 MG tablet  Commonly known as:  LASIX      TAKE these medications        acetaminophen 500 MG tablet  Commonly known as:  TYLENOL  Take 1,000 mg by mouth every 6 (six) hours as needed (pain).     baclofen 20 MG tablet  Commonly known as:  LIORESAL  Take 20 mg by mouth 4 (four) times daily.     collagenase ointment  Commonly known as:  SANTYL  Apply 1 application topically daily.     docusate sodium 100 MG capsule  Commonly known as:  COLACE  Take 100 mg by mouth 2 (two) times daily as needed for mild constipation.     famotidine 20 MG tablet  Commonly known as:  PEPCID  Take 20 mg by mouth 2 (two) times daily.     lactobacillus Pack  Take 1 packet (1 g total) by mouth 3 (three) times daily with meals.     meloxicam 7.5 MG tablet  Commonly known as:  MOBIC  Take 7.5 mg by mouth 2 (two) times daily as needed for pain.     metoCLOPramide 10 MG tablet  Commonly known as:  REGLAN  Take 1 tablet (10 mg total) by mouth 3 (three)  times daily before meals.     multivitamin with minerals Tabs tablet  Take 1 tablet by mouth every morning.     feeding supplement Liqd  Take 1 Container by mouth 3 (three) times daily between meals.     nutrition supplement (JUVEN) Pack  Take 1 packet by mouth 2 (two) times daily between meals.     Oxycodone HCl 10 MG Tabs  Take 10 mg by mouth every 6 (six) hours as needed (for pain).     polyethylene glycol packet  Commonly known as:  MIRALAX / GLYCOLAX  Take 17 g by mouth daily.     potassium chloride SA 20 MEQ tablet  Commonly known as:  K-DUR,KLOR-CON  Take 2 tablets (40 mEq total) by mouth daily as needed.     VESICARE 10 MG tablet  Generic drug:  solifenacin  Take 10 mg by mouth daily.     vitamin C 500 MG tablet  Commonly known as:  ASCORBIC ACID  Take 500 mg by mouth every morning.     zinc sulfate 220 MG capsule  Take 220 mg by mouth every morning.           Follow-up Information    Follow  up with [facility]              In 2 weeks.   Contact information:   [address] [phone]      SignedTheodoro Kos 10/02/2014, 12:51 PM

## 2014-10-07 ENCOUNTER — Ambulatory Visit (HOSPITAL_BASED_OUTPATIENT_CLINIC_OR_DEPARTMENT_OTHER): Payer: Medicare Other | Admitting: Hematology

## 2014-10-07 ENCOUNTER — Encounter: Payer: Self-pay | Admitting: Hematology

## 2014-10-07 ENCOUNTER — Ambulatory Visit: Payer: Medicare Other

## 2014-10-07 ENCOUNTER — Telehealth: Payer: Self-pay | Admitting: Hematology

## 2014-10-07 ENCOUNTER — Ambulatory Visit (HOSPITAL_BASED_OUTPATIENT_CLINIC_OR_DEPARTMENT_OTHER): Payer: Medicare Other

## 2014-10-07 VITALS — BP 113/72 | HR 93 | Temp 97.8°F | Resp 18

## 2014-10-07 DIAGNOSIS — D509 Iron deficiency anemia, unspecified: Secondary | ICD-10-CM

## 2014-10-07 DIAGNOSIS — D649 Anemia, unspecified: Secondary | ICD-10-CM

## 2014-10-07 LAB — COMPREHENSIVE METABOLIC PANEL (CC13)
ALT: 7 U/L (ref 0–55)
AST: 16 U/L (ref 5–34)
Albumin: 2.8 g/dL — ABNORMAL LOW (ref 3.5–5.0)
Alkaline Phosphatase: 80 U/L (ref 40–150)
Anion Gap: 12 meq/L — ABNORMAL HIGH (ref 3–11)
BUN: 8.7 mg/dL (ref 7.0–26.0)
CO2: 22 meq/L (ref 22–29)
Calcium: 9.1 mg/dL (ref 8.4–10.4)
Chloride: 103 meq/L (ref 98–109)
Creatinine: 0.6 mg/dL — ABNORMAL LOW (ref 0.7–1.3)
EGFR: >90 ml/min/1.73 m2
Glucose: 117 mg/dL (ref 70–140)
Potassium: 3.7 meq/L (ref 3.5–5.1)
Sodium: 137 meq/L (ref 136–145)
Total Bilirubin: <0.2 mg/dL (ref 0.20–1.20)
Total Protein: 8 g/dL (ref 6.4–8.3)

## 2014-10-07 LAB — CBC & DIFF AND RETIC
BASO%: 0.2 % (ref 0.0–2.0)
Basophils Absolute: 0 10e3/uL (ref 0.0–0.1)
EOS%: 0.8 % (ref 0.0–7.0)
Eosinophils Absolute: 0.1 10e3/uL (ref 0.0–0.5)
HCT: 29.7 % — ABNORMAL LOW (ref 38.4–49.9)
HGB: 9.1 g/dL — ABNORMAL LOW (ref 13.0–17.1)
Immature Retic Fract: 11.6 % — ABNORMAL HIGH (ref 3.00–10.60)
LYMPH%: 10.7 % — ABNORMAL LOW (ref 14.0–49.0)
MCH: 22.7 pg — ABNORMAL LOW (ref 27.2–33.4)
MCHC: 30.6 g/dL — ABNORMAL LOW (ref 32.0–36.0)
MCV: 74.1 fL — ABNORMAL LOW (ref 79.3–98.0)
MONO#: 1 10e3/uL — ABNORMAL HIGH (ref 0.1–0.9)
MONO%: 8.1 % (ref 0.0–14.0)
NEUT#: 10.1 10e3/uL — ABNORMAL HIGH (ref 1.5–6.5)
NEUT%: 80.2 % — ABNORMAL HIGH (ref 39.0–75.0)
Platelets: 528 10e3/uL — ABNORMAL HIGH (ref 140–400)
RBC: 4.01 10e6/uL — ABNORMAL LOW (ref 4.20–5.82)
RDW: 20.8 % — ABNORMAL HIGH (ref 11.0–14.6)
Retic %: 1.11 % (ref 0.80–1.80)
Retic Ct Abs: 44.51 10e3/uL (ref 34.80–93.90)
WBC: 12.6 10e3/uL — ABNORMAL HIGH (ref 4.0–10.3)
lymph#: 1.4 10e3/uL (ref 0.9–3.3)

## 2014-10-07 LAB — MORPHOLOGY: PLT EST: INCREASED

## 2014-10-07 LAB — TSH CHCC: TSH: 1.482 m[IU]/L (ref 0.320–4.118)

## 2014-10-07 LAB — LACTATE DEHYDROGENASE (CC13): LDH: 114 U/L — AB (ref 125–245)

## 2014-10-07 MED ORDER — FERROUS SULFATE 325 (65 FE) MG PO TBEC: 325.0000 mg | DELAYED_RELEASE_TABLET | Freq: Three times a day (TID) | ORAL | Status: DC

## 2014-10-07 NOTE — Progress Notes (Signed)
Checked in new pt with no financial concerns. °

## 2014-10-07 NOTE — Telephone Encounter (Signed)
Patient sent back to lab and given avs report and appointments for June.
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
## 2014-10-08 ENCOUNTER — Emergency Department (HOSPITAL_COMMUNITY): Payer: Medicare Other

## 2014-10-08 ENCOUNTER — Encounter (HOSPITAL_COMMUNITY): Payer: Self-pay | Admitting: Emergency Medicine

## 2014-10-08 ENCOUNTER — Inpatient Hospital Stay (HOSPITAL_COMMUNITY)
Admission: EM | Admit: 2014-10-08 | Discharge: 2014-10-10 | DRG: 377 | Disposition: A | Discharge status: Home Health | Payer: Medicare Other | Attending: Internal Medicine | Admitting: Internal Medicine

## 2014-10-08 DIAGNOSIS — K449 Diaphragmatic hernia without obstruction or gangrene: Secondary | ICD-10-CM | POA: Diagnosis present

## 2014-10-08 DIAGNOSIS — R112 Nausea with vomiting, unspecified: Secondary | ICD-10-CM | POA: Diagnosis present

## 2014-10-08 DIAGNOSIS — R578 Other shock: Secondary | ICD-10-CM

## 2014-10-08 DIAGNOSIS — Z833 Family history of diabetes mellitus: Secondary | ICD-10-CM

## 2014-10-08 DIAGNOSIS — K210 Gastro-esophageal reflux disease with esophagitis: Secondary | ICD-10-CM | POA: Diagnosis present

## 2014-10-08 DIAGNOSIS — K2901 Acute gastritis with bleeding: Secondary | ICD-10-CM | POA: Diagnosis not present

## 2014-10-08 DIAGNOSIS — Z8701 Personal history of pneumonia (recurrent): Secondary | ICD-10-CM | POA: Diagnosis not present

## 2014-10-08 DIAGNOSIS — L89314 Pressure ulcer of right buttock, stage 4: Secondary | ICD-10-CM | POA: Diagnosis present

## 2014-10-08 DIAGNOSIS — G8250 Quadriplegia, unspecified: Secondary | ICD-10-CM | POA: Diagnosis not present

## 2014-10-08 DIAGNOSIS — K3184 Gastroparesis: Secondary | ICD-10-CM | POA: Diagnosis present

## 2014-10-08 DIAGNOSIS — I10 Essential (primary) hypertension: Secondary | ICD-10-CM | POA: Diagnosis present

## 2014-10-08 DIAGNOSIS — Z933 Colostomy status: Secondary | ICD-10-CM

## 2014-10-08 DIAGNOSIS — Z79891 Long term (current) use of opiate analgesic: Secondary | ICD-10-CM | POA: Diagnosis not present

## 2014-10-08 DIAGNOSIS — R110 Nausea: Secondary | ICD-10-CM | POA: Diagnosis not present

## 2014-10-08 DIAGNOSIS — Z87440 Personal history of urinary (tract) infections: Secondary | ICD-10-CM | POA: Diagnosis not present

## 2014-10-08 DIAGNOSIS — Z79899 Other long term (current) drug therapy: Secondary | ICD-10-CM | POA: Diagnosis not present

## 2014-10-08 DIAGNOSIS — J9610 Chronic respiratory failure, unspecified whether with hypoxia or hypercapnia: Secondary | ICD-10-CM | POA: Diagnosis present

## 2014-10-08 DIAGNOSIS — L8994 Pressure ulcer of unspecified site, stage 4: Secondary | ICD-10-CM | POA: Diagnosis present

## 2014-10-08 DIAGNOSIS — K920 Hematemesis: Principal | ICD-10-CM | POA: Diagnosis present

## 2014-10-08 DIAGNOSIS — R000 Tachycardia, unspecified: Secondary | ICD-10-CM | POA: Diagnosis present

## 2014-10-08 DIAGNOSIS — Z803 Family history of malignant neoplasm of breast: Secondary | ICD-10-CM | POA: Diagnosis not present

## 2014-10-08 DIAGNOSIS — K5900 Constipation, unspecified: Secondary | ICD-10-CM | POA: Diagnosis present

## 2014-10-08 DIAGNOSIS — D72829 Elevated white blood cell count, unspecified: Secondary | ICD-10-CM | POA: Diagnosis present

## 2014-10-08 DIAGNOSIS — K259 Gastric ulcer, unspecified as acute or chronic, without hemorrhage or perforation: Secondary | ICD-10-CM | POA: Diagnosis present

## 2014-10-08 DIAGNOSIS — D62 Acute posthemorrhagic anemia: Secondary | ICD-10-CM | POA: Diagnosis present

## 2014-10-08 DIAGNOSIS — Z888 Allergy status to other drugs, medicaments and biological substances status: Secondary | ICD-10-CM | POA: Diagnosis not present

## 2014-10-08 DIAGNOSIS — K5909 Other constipation: Secondary | ICD-10-CM | POA: Diagnosis not present

## 2014-10-08 DIAGNOSIS — G4733 Obstructive sleep apnea (adult) (pediatric): Secondary | ICD-10-CM | POA: Diagnosis present

## 2014-10-08 DIAGNOSIS — K2970 Gastritis, unspecified, without bleeding: Secondary | ICD-10-CM | POA: Diagnosis present

## 2014-10-08 DIAGNOSIS — R579 Shock, unspecified: Secondary | ICD-10-CM | POA: Diagnosis present

## 2014-10-08 DIAGNOSIS — E662 Morbid (severe) obesity with alveolar hypoventilation: Secondary | ICD-10-CM | POA: Diagnosis present

## 2014-10-08 DIAGNOSIS — R569 Unspecified convulsions: Secondary | ICD-10-CM | POA: Diagnosis present

## 2014-10-08 DIAGNOSIS — Z8711 Personal history of peptic ulcer disease: Secondary | ICD-10-CM | POA: Diagnosis not present

## 2014-10-08 DIAGNOSIS — K922 Gastrointestinal hemorrhage, unspecified: Secondary | ICD-10-CM | POA: Diagnosis present

## 2014-10-08 DIAGNOSIS — K264 Chronic or unspecified duodenal ulcer with hemorrhage: Secondary | ICD-10-CM | POA: Diagnosis not present

## 2014-10-08 DIAGNOSIS — R140 Abdominal distension (gaseous): Secondary | ICD-10-CM

## 2014-10-08 LAB — PREPARE RBC (CROSSMATCH)

## 2014-10-08 LAB — CBC WITH DIFFERENTIAL/PLATELET
BASOS PCT: 0 % (ref 0–1)
Basophils Absolute: 0 10*3/uL (ref 0.0–0.1)
EOS ABS: 0 10*3/uL (ref 0.0–0.7)
Eosinophils Relative: 0 % (ref 0–5)
HEMATOCRIT: 32.1 % — AB (ref 39.0–52.0)
HEMOGLOBIN: 9.8 g/dL — AB (ref 13.0–17.0)
LYMPHS PCT: 7 % — AB (ref 12–46)
Lymphs Abs: 1.3 10*3/uL (ref 0.7–4.0)
MCH: 22.6 pg — ABNORMAL LOW (ref 26.0–34.0)
MCHC: 30.5 g/dL (ref 30.0–36.0)
MCV: 74 fL — ABNORMAL LOW (ref 78.0–100.0)
MONOS PCT: 8 % (ref 3–12)
Monocytes Absolute: 1.5 10*3/uL — ABNORMAL HIGH (ref 0.1–1.0)
NEUTROS PCT: 85 % — AB (ref 43–77)
Neutro Abs: 16.4 10*3/uL — ABNORMAL HIGH (ref 1.7–7.7)
Platelets: 546 10*3/uL — ABNORMAL HIGH (ref 150–400)
RBC: 4.34 MIL/uL (ref 4.22–5.81)
RDW: 20.8 % — AB (ref 11.5–15.5)
WBC: 19.2 10*3/uL — ABNORMAL HIGH (ref 4.0–10.5)

## 2014-10-08 LAB — COMPREHENSIVE METABOLIC PANEL
ALK PHOS: 79 U/L (ref 38–126)
ALT: 12 U/L — ABNORMAL LOW (ref 17–63)
ANION GAP: 11 (ref 5–15)
AST: 16 U/L (ref 15–41)
Albumin: 3.1 g/dL — ABNORMAL LOW (ref 3.5–5.0)
BUN: 22 mg/dL — AB (ref 6–20)
CALCIUM: 8.7 mg/dL — AB (ref 8.9–10.3)
CHLORIDE: 101 mmol/L (ref 101–111)
CO2: 22 mmol/L (ref 22–32)
Creatinine, Ser: 0.63 mg/dL (ref 0.61–1.24)
GFR calc Af Amer: >60 mL/min (ref >60)
GFR calc non Af Amer: >60 mL/min (ref >60)
GLUCOSE: 145 mg/dL — AB (ref 65–99)
Potassium: 3.7 mmol/L (ref 3.5–5.1)
SODIUM: 134 mmol/L — AB (ref 135–145)
Total Bilirubin: 0.2 mg/dL — ABNORMAL LOW (ref 0.3–1.2)
Total Protein: 8.2 g/dL — ABNORMAL HIGH (ref 6.5–8.1)

## 2014-10-08 LAB — URINALYSIS, ROUTINE W REFLEX MICROSCOPIC
Bilirubin Urine: NEGATIVE
GLUCOSE, UA: NEGATIVE mg/dL
HGB URINE DIPSTICK: NEGATIVE
Ketones, ur: 15 mg/dL — AB
NITRITE: NEGATIVE
PROTEIN: NEGATIVE mg/dL
SPECIFIC GRAVITY, URINE: 1.018 (ref 1.005–1.030)
UROBILINOGEN UA: 0.2 mg/dL (ref 0.0–1.0)
pH: 6 (ref 5.0–8.0)

## 2014-10-08 LAB — I-STAT CG4 LACTIC ACID, ED
Lactic Acid, Venous: 1.02 mmol/L (ref 0.5–2.0)
Lactic Acid, Venous: 0.53 mmol/L (ref 0.5–2.0)

## 2014-10-08 LAB — CBC
HCT: 30.9 % — ABNORMAL LOW (ref 39.0–52.0)
Hemoglobin: 9.9 g/dL — ABNORMAL LOW (ref 13.0–17.0)
MCH: 24.6 pg — AB (ref 26.0–34.0)
MCHC: 32 g/dL (ref 30.0–36.0)
MCV: 76.9 fL — ABNORMAL LOW (ref 78.0–100.0)
PLATELETS: 409 10*3/uL — AB (ref 150–400)
RBC: 4.02 MIL/uL — AB (ref 4.22–5.81)
RDW: 20.4 % — AB (ref 11.5–15.5)
WBC: 12.7 10*3/uL — ABNORMAL HIGH (ref 4.0–10.5)

## 2014-10-08 LAB — MRSA PCR SCREENING: MRSA by PCR: NEGATIVE

## 2014-10-08 LAB — URINE MICROSCOPIC-ADD ON

## 2014-10-08 LAB — LIPASE, BLOOD: Lipase: 31 U/L (ref 22–51)

## 2014-10-08 LAB — HEMOGLOBIN AND HEMATOCRIT, BLOOD
HEMATOCRIT: 25.5 % — AB (ref 39.0–52.0)
Hemoglobin: 7.8 g/dL — ABNORMAL LOW (ref 13.0–17.0)

## 2014-10-08 MED ORDER — IOHEXOL 300 MG/ML  SOLN
100.0000 mL | Freq: Once | INTRAMUSCULAR | Status: AC | PRN
Start: 2014-10-08 — End: 2014-10-08
  Administered 2014-10-08: 100 mL via INTRAVENOUS

## 2014-10-08 MED ORDER — SODIUM CHLORIDE 0.9 % IV BOLUS (SEPSIS)
1000.0000 mL | Freq: Once | INTRAVENOUS | Status: AC
  Administered 2014-10-08: 1000 mL via INTRAVENOUS

## 2014-10-08 MED ORDER — MORPHINE SULFATE 2 MG/ML IJ SOLN: 1.0000 mg | INTRAMUSCULAR | Status: DC | PRN

## 2014-10-08 MED ORDER — IPRATROPIUM-ALBUTEROL 0.5-2.5 (3) MG/3ML IN SOLN
3.0000 mL | Freq: Three times a day (TID) | RESPIRATORY_TRACT | Status: DC
  Administered 2014-10-08 (×2): 3 mL via RESPIRATORY_TRACT
  Filled 2014-10-08 (×2): qty 3

## 2014-10-08 MED ORDER — PANTOPRAZOLE SODIUM 40 MG IV SOLR
40.0000 mg | Freq: Two times a day (BID) | INTRAVENOUS | Status: DC
  Administered 2014-10-08 – 2014-10-10 (×4): 40 mg via INTRAVENOUS
  Filled 2014-10-08 (×4): qty 40

## 2014-10-08 MED ORDER — ONDANSETRON HCL 4 MG/2ML IJ SOLN
4.0000 mg | Freq: Once | INTRAMUSCULAR | Status: AC
  Administered 2014-10-08: 4 mg via INTRAVENOUS
  Filled 2014-10-08: qty 2

## 2014-10-08 MED ORDER — SODIUM CHLORIDE 0.9 % IV SOLN
Freq: Once | INTRAVENOUS | Status: AC
  Administered 2014-10-08: 15:00:00 via INTRAVENOUS

## 2014-10-08 MED ORDER — SODIUM CHLORIDE 0.9 % IV SOLN
1000.0000 mL | INTRAVENOUS | Status: DC
  Administered 2014-10-08 – 2014-10-09 (×2): 1000 mL via INTRAVENOUS

## 2014-10-08 MED ORDER — CHLORHEXIDINE GLUCONATE 0.12 % MT SOLN
15.0000 mL | Freq: Two times a day (BID) | OROMUCOSAL | Status: DC
  Administered 2014-10-08 – 2014-10-10 (×2): 15 mL via OROMUCOSAL
  Filled 2014-10-08 (×2): qty 15

## 2014-10-08 MED ORDER — CETYLPYRIDINIUM CHLORIDE 0.05 % MT LIQD
7.0000 mL | Freq: Two times a day (BID) | OROMUCOSAL | Status: DC
  Administered 2014-10-09 – 2014-10-10 (×2): 7 mL via OROMUCOSAL

## 2014-10-08 MED ORDER — IPRATROPIUM-ALBUTEROL 0.5-2.5 (3) MG/3ML IN SOLN
3.0000 mL | Freq: Two times a day (BID) | RESPIRATORY_TRACT | Status: DC
  Administered 2014-10-09 – 2014-10-10 (×3): 3 mL via RESPIRATORY_TRACT
  Filled 2014-10-08 (×3): qty 3

## 2014-10-08 MED ORDER — IOHEXOL 300 MG/ML  SOLN
50.0000 mL | Freq: Once | INTRAMUSCULAR | Status: AC | PRN
  Administered 2014-10-08: 50 mL via ORAL

## 2014-10-08 MED ORDER — ONDANSETRON HCL 4 MG/2ML IJ SOLN: 4.0000 mg | Freq: Four times a day (QID) | INTRAMUSCULAR | Status: DC | PRN

## 2014-10-08 MED ORDER — FAMOTIDINE IN NACL 20-0.9 MG/50ML-% IV SOLN
20.0000 mg | Freq: Two times a day (BID) | INTRAVENOUS | Status: DC
  Administered 2014-10-08 – 2014-10-10 (×4): 20 mg via INTRAVENOUS
  Filled 2014-10-08 (×4): qty 50
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
## 2014-10-08 NOTE — ED Notes (Signed)
Pt had what appears to be a vagal response directly prior to IV insertion. BP dropped to 60/23 then to 50/34. Alerted MD who came to evaluate patient and began a fluid bolus. Pt stated he did feel dizzy, light headed, and had some blurred vision when symptoms started. At this time BP has risen to 75/50 at this time and patient states he is starting to feel a little better.
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
## 2014-10-08 NOTE — Progress Notes (Signed)
Pt wants to attempt CPAP tonight even with the NG tube in.  Pt wants CPAP at around 2330, RT to monitor and assess as needed.

## 2014-10-08 NOTE — ED Notes (Signed)
Pt beginning to receive blood product at this time, unable to obtain labs at this time. RN is aware
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
## 2014-10-08 NOTE — ED Notes (Signed)
Inserted NG tube, no complications. Auscultated for air at upper left abd quadrant, aspirated gastric fluid, attached to suction and pulled off 400 ml coffee ground like emesis, set suction to 100 mmHg intermittently. Alerted MD to emesis. He stated they would get a scan to verify placement.

## 2014-10-08 NOTE — Progress Notes (Signed)
Patient's computer chart was reviewed as well as previous endoscopy and case discussed with the ER physician and have scheduled him for a endoscopy tomorrow at 10:30 and please call us sooner if question or problem before that

## 2014-10-08 NOTE — ED Notes (Signed)
Bed: WA02 Expected date:  Expected time:  Means of arrival:  Comments: NV abd pain

## 2014-10-08 NOTE — ED Notes (Signed)
Emptied 875 ml urine out of suprapubic cath bag

## 2014-10-08 NOTE — Consult Note (Addendum)
WOC wound consult note Reason for Consult: Chronic Stage 4 pressure injuries. Patient is well known to our department from previous admissions.  He is followed by the outpatient wound care center by Dr. [name] (Plastics) and surgical repair of the areas with chronic osteomyelitis is being considered either at [facility] or [facility] in [place], [place].  Patient has a paid caregiver in the home. Current ulcerations are distributed within a massive area of skin loss that has been replaced both from healing and regeneration and by various types of skin grafting. Due to previous surgeries, anatomical landmarks are altered. Wound type:Pressure Pressure Ulcer POA: Yes Measurement: Sacrum: 5cm x 12cm x 0.2cm (clean, pink, non-granulating), Stage 4 Right hip: 3cm x 3cm x 5cm tunneling wound with dark red tissue, moist base.  Stage 4 Left buttock:3cm x 7cm x 0.2cm clean, pink, moist (non-granulating) Left groin: 5cm x 4cm x 0.2cm 60% red, moist 40% covered with thin yellow slough Wound bed: See above Drainage (amount, consistency, odor) Wound had not been changed since yesterday and are wet with light yellow exudate and have a musty, strong odor.  This dissipates with wound cleansing. Periwound:intact, moist, macerated Dressing procedure/placement/frequency: I will implement conservative dressing orders (normal saline continually moist) for all areas except the left groin, where I will use a silver hydrofiber dressing.  Dressings to be changed twice daily.  Patient has Materials engineer at home and does not wish to lose them during the admission, so we will float the heels while here and resume Prevalon Boots at home.  He requires a Bariatric bed and one is provided today. Nursing staff is reminded to minimize bed linens between patient and therapeutic mattress with low air loss feature. Patient to follow up with the outpatient wound care center upon discharge.  Patient and caregiver are in agreement with the  established POC. LLQ Colostomy is assessed through intact pouching system. Patient and caregiver state there there are no ostomy needs at this time except for additional supplies while in house.  I will order the patient's preferred supply (2-piece, 2 and 3/4 inch ostomy pouching system) to the bedside.  I will also supply skin barrier rings and Nursing orders to empty pouch when 1/3 to 1/2 full, change twice weekly and PRN. [place] nursing team will not follow routinely, but will remain available to this patient, the nursing and medical teams.  Please re-consult if needed. Thanks, Maudie Flakes, MSN, RN, [place], [place], [place] [phone])

## 2014-10-08 NOTE — Progress Notes (Signed)
Per Dr. Erlinda Hong, for night shift, call NP/MD for SBP<90

## 2014-10-09 ENCOUNTER — Encounter (HOSPITAL_COMMUNITY)
Admission: EM | Disposition: A | Discharge status: Home Health | Payer: Self-pay | Source: Home / Self Care | Attending: Internal Medicine

## 2014-10-09 ENCOUNTER — Inpatient Hospital Stay (HOSPITAL_COMMUNITY): Payer: Medicare Other | Admitting: Anesthesiology

## 2014-10-09 ENCOUNTER — Encounter (HOSPITAL_COMMUNITY): Payer: Self-pay

## 2014-10-09 DIAGNOSIS — L8994 Pressure ulcer of unspecified site, stage 4: Secondary | ICD-10-CM

## 2014-10-09 DIAGNOSIS — K920 Hematemesis: Principal | ICD-10-CM

## 2014-10-09 DIAGNOSIS — K264 Chronic or unspecified duodenal ulcer with hemorrhage: Secondary | ICD-10-CM

## 2014-10-09 HISTORY — PX: ESOPHAGOGASTRODUODENOSCOPY (EGD) WITH PROPOFOL: SHX5813

## 2014-10-09 LAB — COMPREHENSIVE METABOLIC PANEL
ALBUMIN: 2.6 g/dL — AB (ref 3.5–5.0)
ALK PHOS: 60 U/L (ref 38–126)
ALT: 10 U/L — AB (ref 17–63)
ANION GAP: 9 (ref 5–15)
AST: 11 U/L — ABNORMAL LOW (ref 15–41)
BUN: 10 mg/dL (ref 6–20)
CO2: 23 mmol/L (ref 22–32)
CREATININE: 0.34 mg/dL — AB (ref 0.61–1.24)
Calcium: 8.3 mg/dL — ABNORMAL LOW (ref 8.9–10.3)
Chloride: 108 mmol/L (ref 101–111)
GFR calc non Af Amer: >60 mL/min (ref >60)
Glucose, Bld: 92 mg/dL (ref 65–99)
POTASSIUM: 3.4 mmol/L — AB (ref 3.5–5.1)
Sodium: 140 mmol/L (ref 135–145)
Total Bilirubin: 0.6 mg/dL (ref 0.3–1.2)
Total Protein: 6.4 g/dL — ABNORMAL LOW (ref 6.5–8.1)

## 2014-10-09 LAB — TYPE AND SCREEN
ABO/RH(D): B POS
Antibody Screen: NEGATIVE
UNIT DIVISION: 0
Unit division: 0

## 2014-10-09 LAB — LACTIC ACID, PLASMA: Lactic Acid, Venous: 0.6 mmol/L (ref 0.5–2.0)

## 2014-10-09 LAB — CBC
HCT: 29.8 % — ABNORMAL LOW (ref 39.0–52.0)
Hemoglobin: 9.4 g/dL — ABNORMAL LOW (ref 13.0–17.0)
MCH: 24.3 pg — AB (ref 26.0–34.0)
MCHC: 31.5 g/dL (ref 30.0–36.0)
MCV: 77 fL — ABNORMAL LOW (ref 78.0–100.0)
Platelets: 426 10*3/uL — ABNORMAL HIGH (ref 150–400)
RBC: 3.87 MIL/uL — ABNORMAL LOW (ref 4.22–5.81)
RDW: 20.4 % — AB (ref 11.5–15.5)
WBC: 11.9 10*3/uL — AB (ref 4.0–10.5)

## 2014-10-09 LAB — PROTIME-INR
INR: 1.25 (ref 0.00–1.49)
Prothrombin Time: 15.9 seconds — ABNORMAL HIGH (ref 11.6–15.2)

## 2014-10-09 SURGERY — ESOPHAGOGASTRODUODENOSCOPY (EGD) WITH PROPOFOL
Anesthesia: Monitor Anesthesia Care

## 2014-10-09 MED ORDER — PROPOFOL 10 MG/ML IV BOLUS
INTRAVENOUS | Status: AC
  Filled 2014-10-09: qty 20

## 2014-10-09 MED ORDER — LACTATED RINGERS IV SOLN
INTRAVENOUS | Status: DC | PRN
  Administered 2014-10-09: 11:00:00 via INTRAVENOUS

## 2014-10-09 MED ORDER — PHENYLEPHRINE HCL 10 MG/ML IJ SOLN
INTRAMUSCULAR | Status: DC | PRN
  Administered 2014-10-09: 100 ug via INTRAVENOUS
  Administered 2014-10-09: 80 ug via INTRAVENOUS

## 2014-10-09 MED ORDER — FENTANYL CITRATE (PF) 100 MCG/2ML IJ SOLN: 25.0000 ug | INTRAMUSCULAR | Status: DC | PRN

## 2014-10-09 MED ORDER — PHENYLEPHRINE 40 MCG/ML (10ML) SYRINGE FOR IV PUSH (FOR BLOOD PRESSURE SUPPORT)
PREFILLED_SYRINGE | INTRAVENOUS | Status: AC
  Filled 2014-10-09: qty 10

## 2014-10-09 MED ORDER — PROPOFOL INFUSION 10 MG/ML OPTIME
INTRAVENOUS | Status: DC | PRN
  Administered 2014-10-09: 100 ug/kg/min via INTRAVENOUS

## 2014-10-09 MED ORDER — PROPOFOL 10 MG/ML IV BOLUS
INTRAVENOUS | Status: AC
  Filled 2014-10-09: qty 40

## 2014-10-09 MED ORDER — LIDOCAINE HCL (CARDIAC) 20 MG/ML IV SOLN
INTRAVENOUS | Status: DC | PRN
  Administered 2014-10-09: 30 mg via INTRAVENOUS

## 2014-10-09 MED ORDER — POTASSIUM CHLORIDE 10 MEQ/100ML IV SOLN
10.0000 meq | INTRAVENOUS | Status: AC
  Administered 2014-10-09 (×4): 10 meq via INTRAVENOUS
  Filled 2014-10-09 (×3): qty 100

## 2014-10-09 MED ORDER — ALBUTEROL SULFATE (2.5 MG/3ML) 0.083% IN NEBU
2.5000 mg | INHALATION_SOLUTION | RESPIRATORY_TRACT | Status: DC | PRN
  Administered 2014-10-09: 2.5 mg via RESPIRATORY_TRACT
  Filled 2014-10-09: qty 3

## 2014-10-09 MED ORDER — LIDOCAINE HCL (CARDIAC) 20 MG/ML IV SOLN
INTRAVENOUS | Status: AC
  Filled 2014-10-09: qty 5

## 2014-10-09 MED ORDER — SODIUM CHLORIDE 0.9 % IV SOLN: INTRAVENOUS | Status: DC

## 2014-10-09 MED ORDER — SALINE SPRAY 0.65 % NA SOLN
1.0000 | NASAL | Status: DC | PRN
  Filled 2014-10-09: qty 44

## 2014-10-09 MED ORDER — BUTAMBEN-TETRACAINE-BENZOCAINE 2-2-14 % EX AERO
INHALATION_SPRAY | CUTANEOUS | Status: DC | PRN
  Administered 2014-10-09: 2 via TOPICAL

## 2014-10-09 SURGICAL SUPPLY — 15 items

## 2014-10-09 NOTE — Progress Notes (Signed)
[facility]  Patient Status: Active (receiving services up to time of hospitalization)  [facility] is providing the following services: RN  If patient discharges after hours, please call [phone].   Lurlean Leyden 10/09/2014, 9:28 AM
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
## 2014-10-09 NOTE — Anesthesia Postprocedure Evaluation (Signed)
  Anesthesia Post-op Note  Patient: [name] [name]  Procedure(s) Performed: Procedure(s): ESOPHAGOGASTRODUODENOSCOPY (EGD) WITH PROPOFOL (N/A)  Patient Location: PACU and Endoscopy Unit  Anesthesia Type:MAC  Level of Consciousness: awake  Airway and Oxygen Therapy: Patient Spontanous Breathing  Post-op Pain: mild  Post-op Assessment: Post-op Vital signs reviewed  Post-op Vital Signs: Reviewed  Last Vitals:  Filed Vitals:   10/09/14 1132  BP: 101/36  Pulse:   Temp:   Resp: 20    Complications: No apparent anesthesia complications
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
## 2014-10-09 NOTE — Anesthesia Preprocedure Evaluation (Addendum)
Anesthesia Evaluation  Patient identified by MRN, date of birth, ID band Patient awake    Airway Mallampati: II  TM Distance: >3 FB Neck ROM: Full    Dental   Pulmonary sleep apnea , pneumonia -,  breath sounds clear to auscultation        Cardiovascular hypertension, + Peripheral Vascular Disease Rhythm:Regular Rate:Normal     Neuro/Psych    GI/Hepatic Neg liver ROS, GERD-  ,  Endo/Other  negative endocrine ROS  Renal/GU negative Renal ROS     Musculoskeletal   Abdominal   Peds  Hematology   Anesthesia Other Findings   Reproductive/Obstetrics                           Anesthesia Physical Anesthesia Plan  ASA: III  Anesthesia Plan: MAC   Post-op Pain Management:    Induction: Intravenous  Airway Management Planned:   Additional Equipment:   Intra-op Plan:   Post-operative Plan:   Informed Consent: I have reviewed the patients History and Physical, chart, labs and discussed the procedure including the risks, benefits and alternatives for the proposed anesthesia with the patient or authorized representative who has indicated his/her understanding and acceptance.   Dental advisory given  Plan Discussed with: CRNA and Anesthesiologist  Anesthesia Plan Comments:         Anesthesia Quick Evaluation

## 2014-10-09 NOTE — Op Note (Signed)
[facility] [place], [address]   ENDOSCOPY PROCEDURE REPORT  PATIENT: [name] [name], [name] [name]  MR#: [id number] BIRTHDATE: [birth year] , 69  yrs. old GENDER: male ENDOSCOPIST: Clarene Essex, MD REFERRED BY: PROCEDURE DATE:  2014/10/11 PROCEDURE:  EGD, diagnostic ASA CLASS:     Class III INDICATIONS:  hematemesis. MEDICATIONS: Propofol 200 mg IV and Lidocaine 30 mg IV phenylephrine 120 mics TOPICAL ANESTHETIC: Cetacaine Spray  DESCRIPTION OF PROCEDURE: After the risks benefits and alternatives of the procedure were thoroughly explained, informed consent was obtained.  Theregular endoscope was inserted first and an endoscopy was done and then the ultraslim Pentax Gastroscope [id number] endoscope was introduced through the mouth and advanced to the proximal jejunum , Without limitations.  The instrument was slowly withdrawn as the mucosa was fully examined. Estimated blood loss is zero unless otherwise noted in this procedure report.    the findings are recorded below       Retroflexed views revealed a hiatal hernia.     The scope was then withdrawn from the patient and the procedure completed.  COMPLICATIONS: There were no immediate complications.  ENDOSCOPIC IMPRESSION: 1. Small hiatal hernia with severe distal and mid ulcerative esophagitis 2. Few lesser curve ulcers and gastritis some of which was NG tube trauma 3. No signs of active bleeding 4. Compression of second portion of the duodenum so we elected to place the ultraslim scope after endoscopy was completed with the regular scope   and advanced to the proximal jejunum without additional findings  RECOMMENDATIONS:keep NG tube out for now can replace when necessary slowly advance diet and would recommend twice a day pump inhibitors for one month and then can decrease him to once a day and would recommend a repeat endoscopy in roughly 2 months and call me sooner when necessary or follow-up in the  office when necessary sooner  REPEAT EXAM: in roughly 2 months as above  eSigned:  Clarene Essex, MD Oct 11, 2014 11:41 AM    CC:  CPT CODES: ICD CODES:  The ICD and CPT codes recommended by this software are interpretations from the data that the clinical staff has captured with the software.  The verification of the translation of this report to the ICD and CPT codes and modifiers is the sole responsibility of the health care institution and practicing physician where this report was generated.  [employer] will not be held responsible for the validity of the ICD and CPT codes included on this report.  AMA assumes no liability for data contained or not contained herein. CPT is a Designer, television/film set of the [employer].  PATIENT NAME:  Abdulla, Pooley MR#: [id number]

## 2014-10-09 NOTE — Transfer of Care (Signed)
Immediate Anesthesia Transfer of Care Note  Patient: [name] [name]  Procedure(s) Performed: Procedure(s): ESOPHAGOGASTRODUODENOSCOPY (EGD) WITH PROPOFOL (N/A)  Patient Location: PACU  Anesthesia Type:MAC  Level of Consciousness: awake and alert   Airway & Oxygen Therapy: Patient Spontanous Breathing and Patient connected to nasal cannula oxygen  Post-op Assessment: Report given to RN and Post -op Vital signs reviewed and stable  Post vital signs: Reviewed and stable  Last Vitals:  Filed Vitals:   10/09/14 0944  BP: 121/55  Pulse: 90  Temp: 36.8 C  Resp: 16    Complications: No apparent anesthesia complications

## 2014-10-09 NOTE — Progress Notes (Signed)
Pt wants CPAP at around 2330, RT to monitor and assess as needed.

## 2014-10-10 ENCOUNTER — Encounter (HOSPITAL_COMMUNITY): Payer: Self-pay | Admitting: Gastroenterology

## 2014-10-10 DIAGNOSIS — R110 Nausea: Secondary | ICD-10-CM

## 2014-10-10 DIAGNOSIS — K2901 Acute gastritis with bleeding: Secondary | ICD-10-CM

## 2014-10-10 DIAGNOSIS — K5909 Other constipation: Secondary | ICD-10-CM

## 2014-10-10 LAB — CBC
HCT: 29.7 % — ABNORMAL LOW (ref 39.0–52.0)
Hemoglobin: 9.1 g/dL — ABNORMAL LOW (ref 13.0–17.0)
MCH: 23.8 pg — ABNORMAL LOW (ref 26.0–34.0)
MCHC: 30.6 g/dL (ref 30.0–36.0)
MCV: 77.5 fL — ABNORMAL LOW (ref 78.0–100.0)
Platelets: 372 10*3/uL (ref 150–400)
RBC: 3.83 MIL/uL — AB (ref 4.22–5.81)
RDW: 20.6 % — ABNORMAL HIGH (ref 11.5–15.5)
WBC: 9.8 10*3/uL (ref 4.0–10.5)

## 2014-10-10 LAB — OCCULT BLOOD X 1 CARD TO LAB, STOOL: Fecal Occult Bld: POSITIVE — AB

## 2014-10-10 LAB — HEMOGLOBINOPATHY EVALUATION
HGB F QUANT: 0 % (ref 0.0–2.0)
Hemoglobin Other: 0 %
Hgb A2 Quant: 2.4 % (ref 2.2–3.2)
Hgb A: 97.6 % (ref 96.8–97.8)
Hgb S Quant: 0 %

## 2014-10-10 LAB — METHYLMALONIC ACID, SERUM

## 2014-10-10 LAB — TRANSFERRIN RECEPTOR, SOLUABLE: Transferrin Receptor, Soluble: 1.21 mg/L (ref 0.76–1.76)

## 2014-10-10 LAB — TESTOSTERONE: Testosterone: 141 ng/dL — ABNORMAL LOW (ref 300–890)

## 2014-10-10 LAB — ERYTHROPOIETIN: Erythropoietin: 11.5 m[IU]/mL (ref 2.6–18.5)

## 2014-10-10 MED ORDER — PANTOPRAZOLE SODIUM 40 MG PO TBEC: 40.0000 mg | DELAYED_RELEASE_TABLET | Freq: Two times a day (BID) | ORAL | Status: DC

## 2014-10-10 MED ORDER — PEG-KCL-NACL-NASULF-NA ASC-C 100 G PO SOLR
1.0000 | Freq: Once | ORAL | Status: AC
  Administered 2014-10-10: 200 g via ORAL
  Filled 2014-10-10: qty 1

## 2014-10-10 MED ORDER — POLYETHYLENE GLYCOL 3350 17 G PO PACK: 17.0000 g | PACK | Freq: Every day | ORAL | Status: DC

## 2014-10-10 NOTE — Progress Notes (Signed)
[name] 9:36 AM  Subjective: Patient is doing much better without any signs of bleeding and no new complaints and wants to eat and we discussed his medicine with the patient and his caretaker and my nurse to try to assist with getting it for him  Objective: Vital signs stable afebrile no acute distress abdomen is soft nontender not distended hemoglobin stable  Assessment: Esophageal and gastric ulcers  Plan: Twice a day pump inhibitors in one month we can try to decrease in the once a day otherwise slowly advance diet and hopefully home soon and either see me in the office in one month or just set up an outpatient endoscopy in 2-3 months to document healing and please call us if we can be of any further assistance with this hospital stay Dr. b available this weekend  [facility]  Pager [phone] After 5PM or if no answer call [phone]
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
## 2014-10-11 NOTE — Progress Notes (Signed)
NCM received call from [facility] An rep for [facility]. ROC orders needed for [facility] RN. Notified attending and verbal order received. Orders entered into EPIC. Jonnie Finner RN CCM Case Mgmt phone [phone]

## 2014-10-13 IMAGING — CT CT ABD-PELV W/ CM
3 of 6 series · 14 of 32 positions shown, 19 images · IV contrast (100ml omni 300)
Comparison: 01/18/2012

CLINICAL DATA: Follow up infected decubitus ulcer with
osteomyelitis of pelvis and femoral heads

CT ABDOMEN AND PELVIS WITH CONTRAST
TECHNIQUE: Multidetector CT imaging of the abdomen and pelvis was
performed following the standard protocol during bolus
administration of intravenous contrast.
Contrast: 100mL OMNIPAQUE IOHEXOL 300 MG/ML  SOLN

[Series 2: routine abdomen · axial · 0.98mm/px · z∈[-362,-70]mm · 5 of 164 slices shown, 10 images]
[im 28/164  soft-tissue]
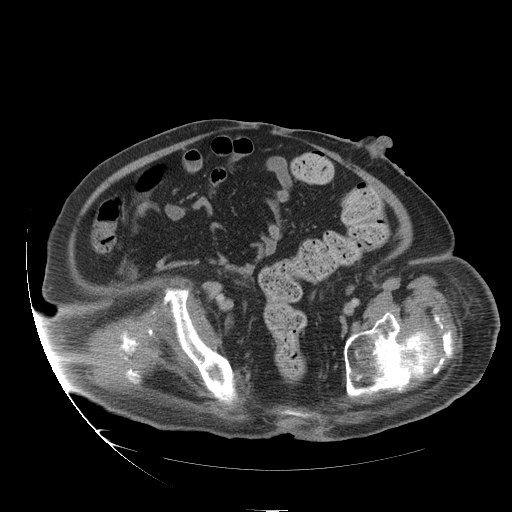
[im 28/164  bone]
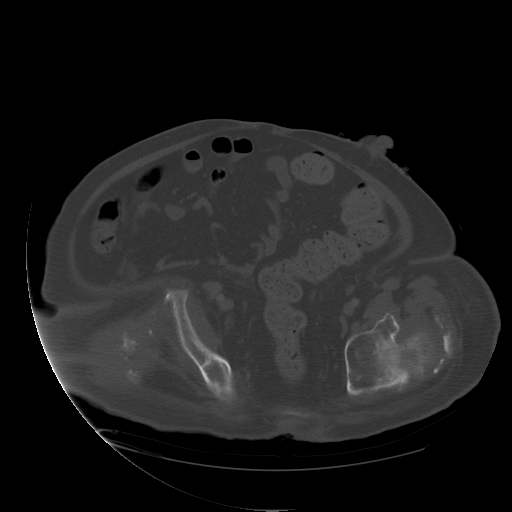
[im 55/164  soft-tissue]
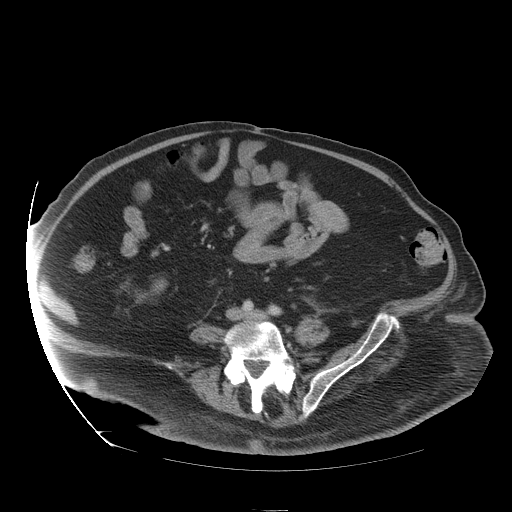
[im 55/164  lung]
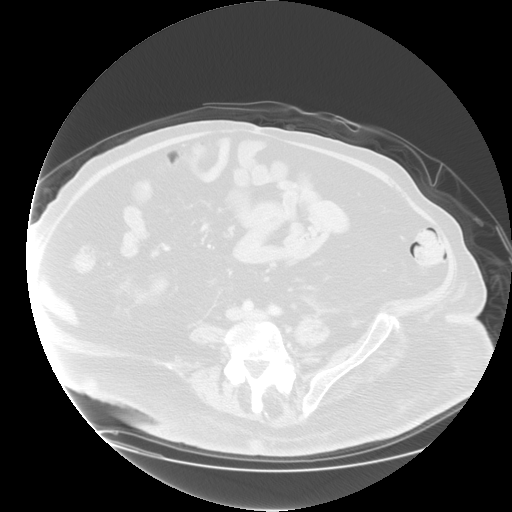
[im 82/164  soft-tissue]
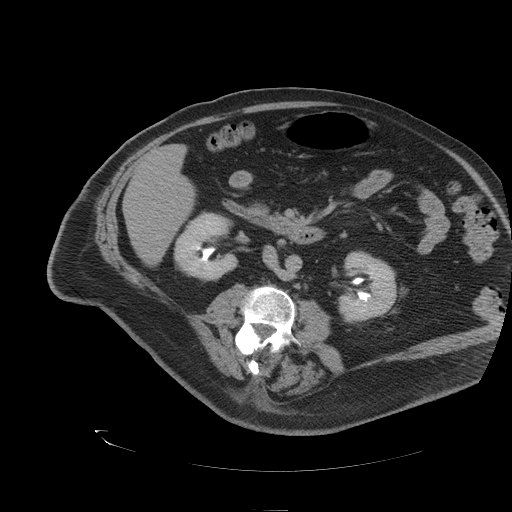
[im 82/164  lung]
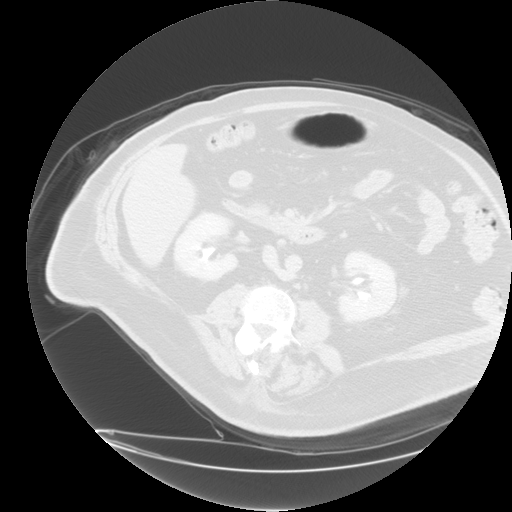
[im 109/164  soft-tissue]
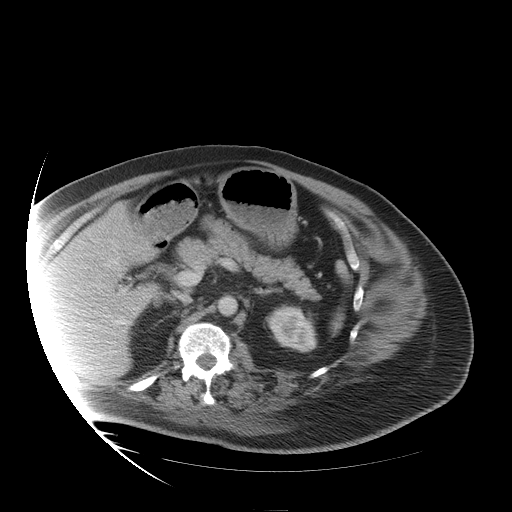
[im 109/164  lung]
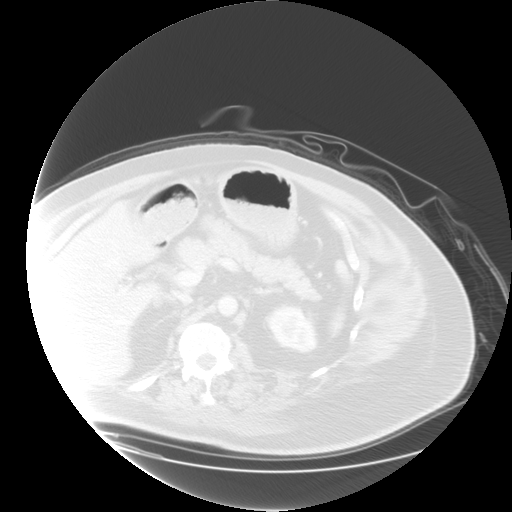
[im 136/164  soft-tissue]
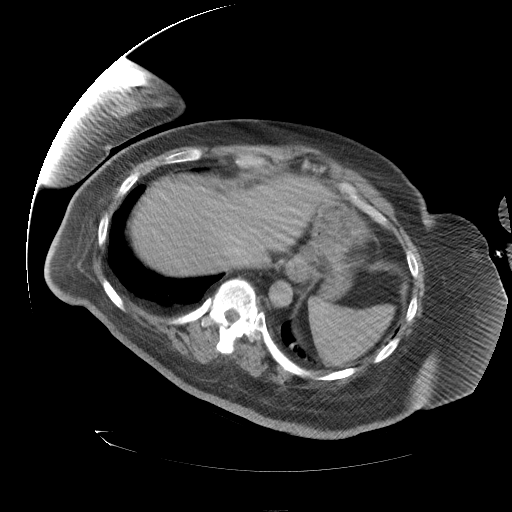
[im 136/164  lung]
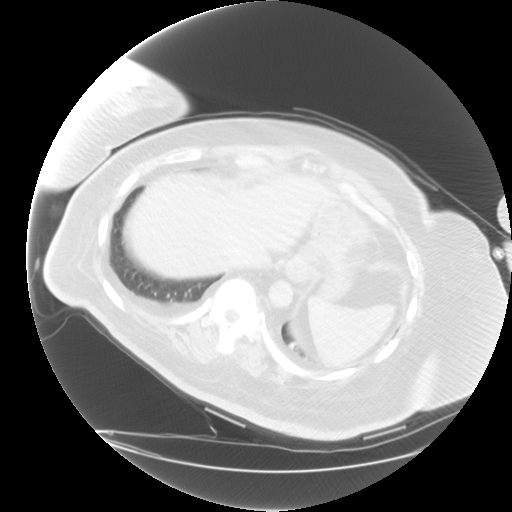

[Series 101: coronals repeat scan · coronal · 0.98mm/px · 3 of 175 slices shown]
[im 35/175  soft-tissue]
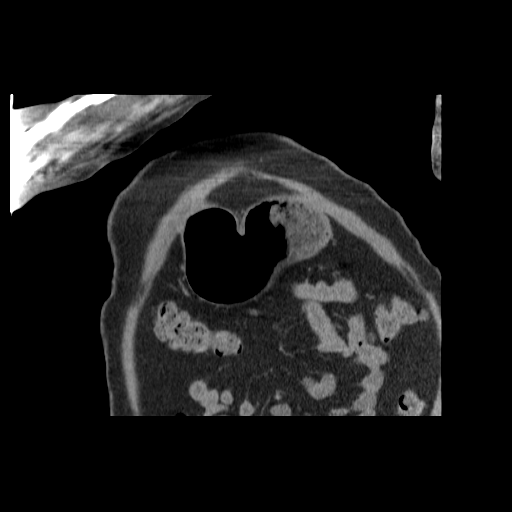
[im 70/175  soft-tissue]
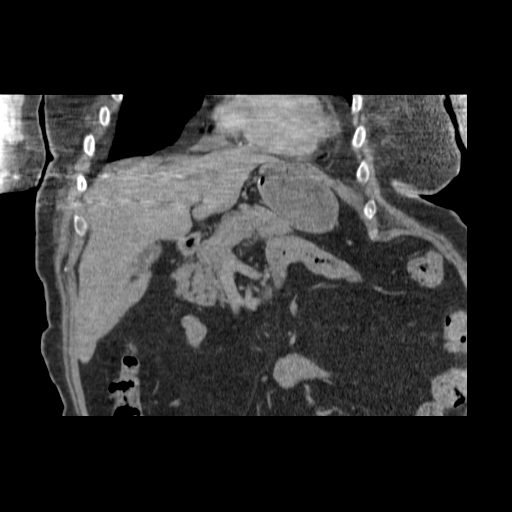
[im 105/175  soft-tissue]
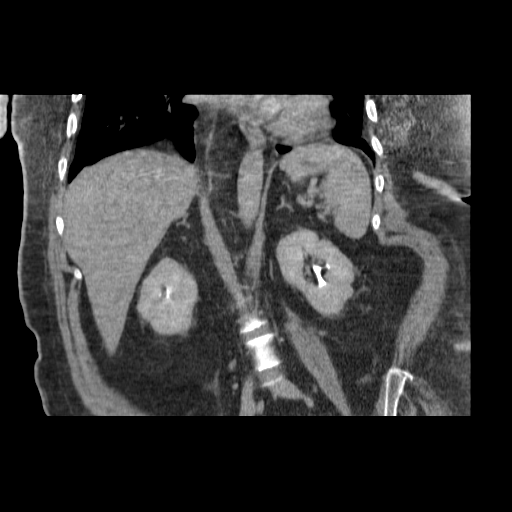

[Series 103: sagittals repeat scan · sagittal · 0.98mm/px · 6 of 221 slices shown]
[im 32/221  soft-tissue]
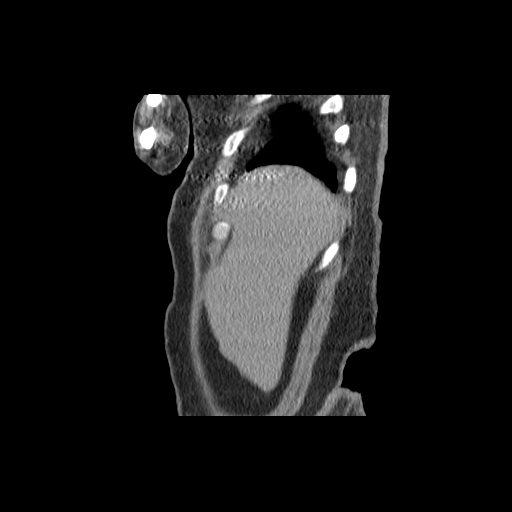
[im 63/221  soft-tissue]
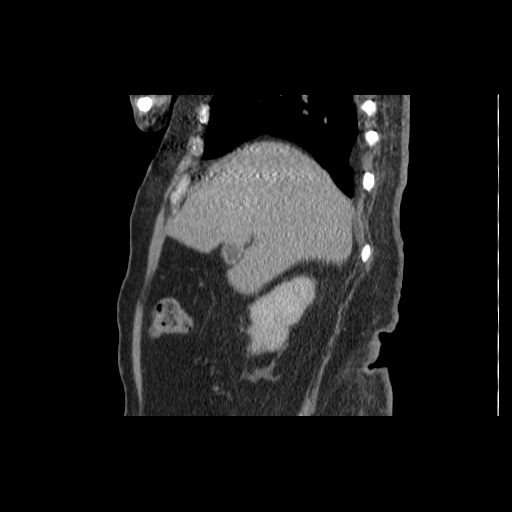
[im 95/221  soft-tissue]
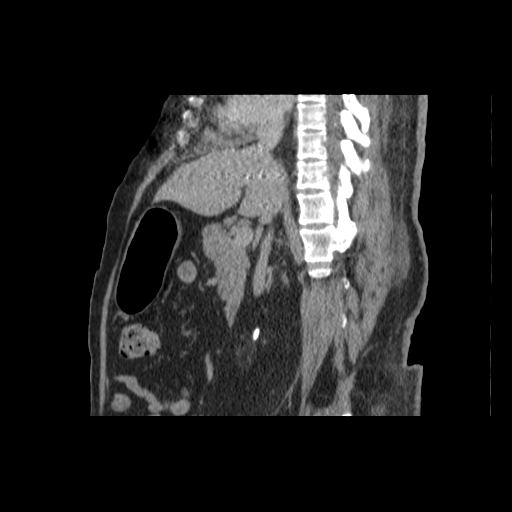
[im 126/221  soft-tissue]
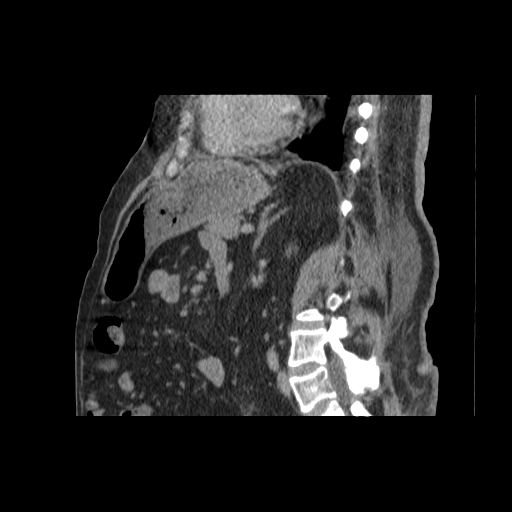
[im 158/221  soft-tissue]
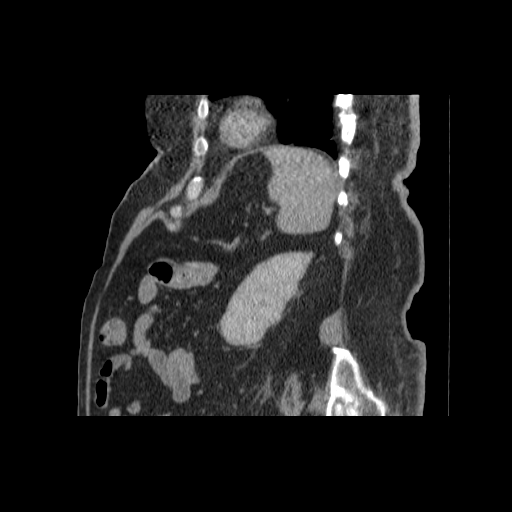
[im 189/221  soft-tissue]
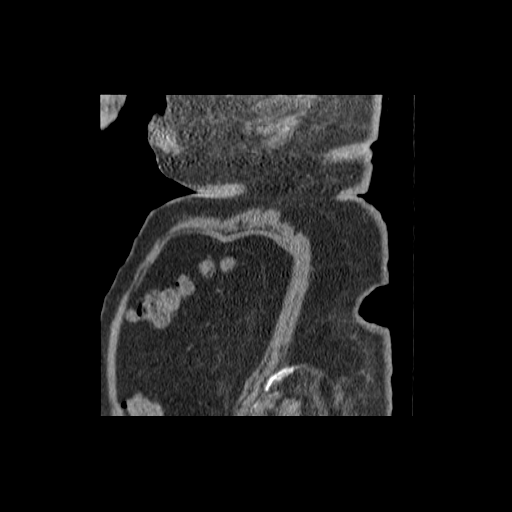

[14 of 32 positions shown; findings below may reference images not displayed]

FINDINGS: Dysmorphic, chronically dislocated right hip.  Associated
decubitus ulcer (series 2/image 86) with overlying cortical
irregularity (series 2/image 85), likely reflecting acute on
chronic osteomyelitis.

Osseous sclerosis involving the bilateral ischial tuberosity
(series 2/image 84), unchanged, likely reflecting at least chronic
osteomyelitis.

Mildly heterogeneous sclerosis of the remaining pelvis,
nonspecific, although noting stable irregularity of the right iliac
crest (series 2/image 66).

Dysmorphic left hip (series 2/image 78), unchanged, without
definite superimposed findings to suggest acute osteomyelitis.

Indwelling suprapubic bladder catheter, although possibly mildly
withdrawn with its balloon in the region of the lower abdominal
musculature (sagittal image 79).  However, the distal tip is likely
still present within the bladder.

Additional findings:
--mild dependent atelectasis in the left lower lobe
--tiny hiatal hernia
--cholelithiasis, without associated inflammatory changes
--left lower quadrant colostomy
IMPRESSION: Suspected acute on chronic osteomyelitis of the right hip, grossly
unchanged.  Associated decubitus ulcer.

Suspected chronic osteomyelitis involving the bilateral ischial
tuberosities, stable.

Possible mildly malpositioned/withdrawn suprapubic bladder
catheter, as above.

## 2014-10-15 ENCOUNTER — Encounter: Payer: Self-pay | Admitting: Infectious Diseases

## 2014-10-15 ENCOUNTER — Ambulatory Visit (INDEPENDENT_AMBULATORY_CARE_PROVIDER_SITE_OTHER): Payer: Medicare Other | Admitting: Infectious Diseases

## 2014-10-15 VITALS — BP 110/76 | HR 94 | Temp 98.5°F | Ht 72.0 in | Wt 207.0 lb

## 2014-10-15 DIAGNOSIS — L89154 Pressure ulcer of sacral region, stage 4: Secondary | ICD-10-CM

## 2014-10-15 LAB — CULTURE, BLOOD (ROUTINE X 2)
CULTURE: NO GROWTH
CULTURE: NO GROWTH
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
## 2014-10-15 NOTE — Assessment & Plan Note (Signed)
Will continue him on his po anbx for 2 more months.  He will work with surgery to define his surgery and to set a date. I have cautioned him that a flap would help heal the underlying tissue. I cannot guarantee that it would heal his osteo. I reccomended that he get put on IV anbx for 4 weeks prior to flap to try and improve his chances of placing his flap into a clean bed.

## 2014-10-20 ENCOUNTER — Encounter (HOSPITAL_BASED_OUTPATIENT_CLINIC_OR_DEPARTMENT_OTHER): Payer: Medicare Other | Attending: Plastic Surgery

## 2014-10-20 DIAGNOSIS — L89894 Pressure ulcer of other site, stage 4: Secondary | ICD-10-CM | POA: Diagnosis not present

## 2014-10-20 DIAGNOSIS — L89323 Pressure ulcer of left buttock, stage 3: Secondary | ICD-10-CM | POA: Insufficient documentation

## 2014-10-20 DIAGNOSIS — M869 Osteomyelitis, unspecified: Secondary | ICD-10-CM | POA: Diagnosis not present

## 2014-10-20 DIAGNOSIS — L89314 Pressure ulcer of right buttock, stage 4: Secondary | ICD-10-CM | POA: Insufficient documentation

## 2014-10-20 DIAGNOSIS — L89153 Pressure ulcer of sacral region, stage 3: Secondary | ICD-10-CM | POA: Insufficient documentation

## 2014-10-30 ENCOUNTER — Telehealth: Payer: Self-pay | Admitting: *Deleted

## 2014-10-30 IMAGING — CR DG CHEST 1V PORT
1 series · 1 of 1 positions shown · non-contrast
Comparison: 02/07/2012

CLINICAL DATA: Chest pain.

PORTABLE CHEST - 1 VIEW

[AP]
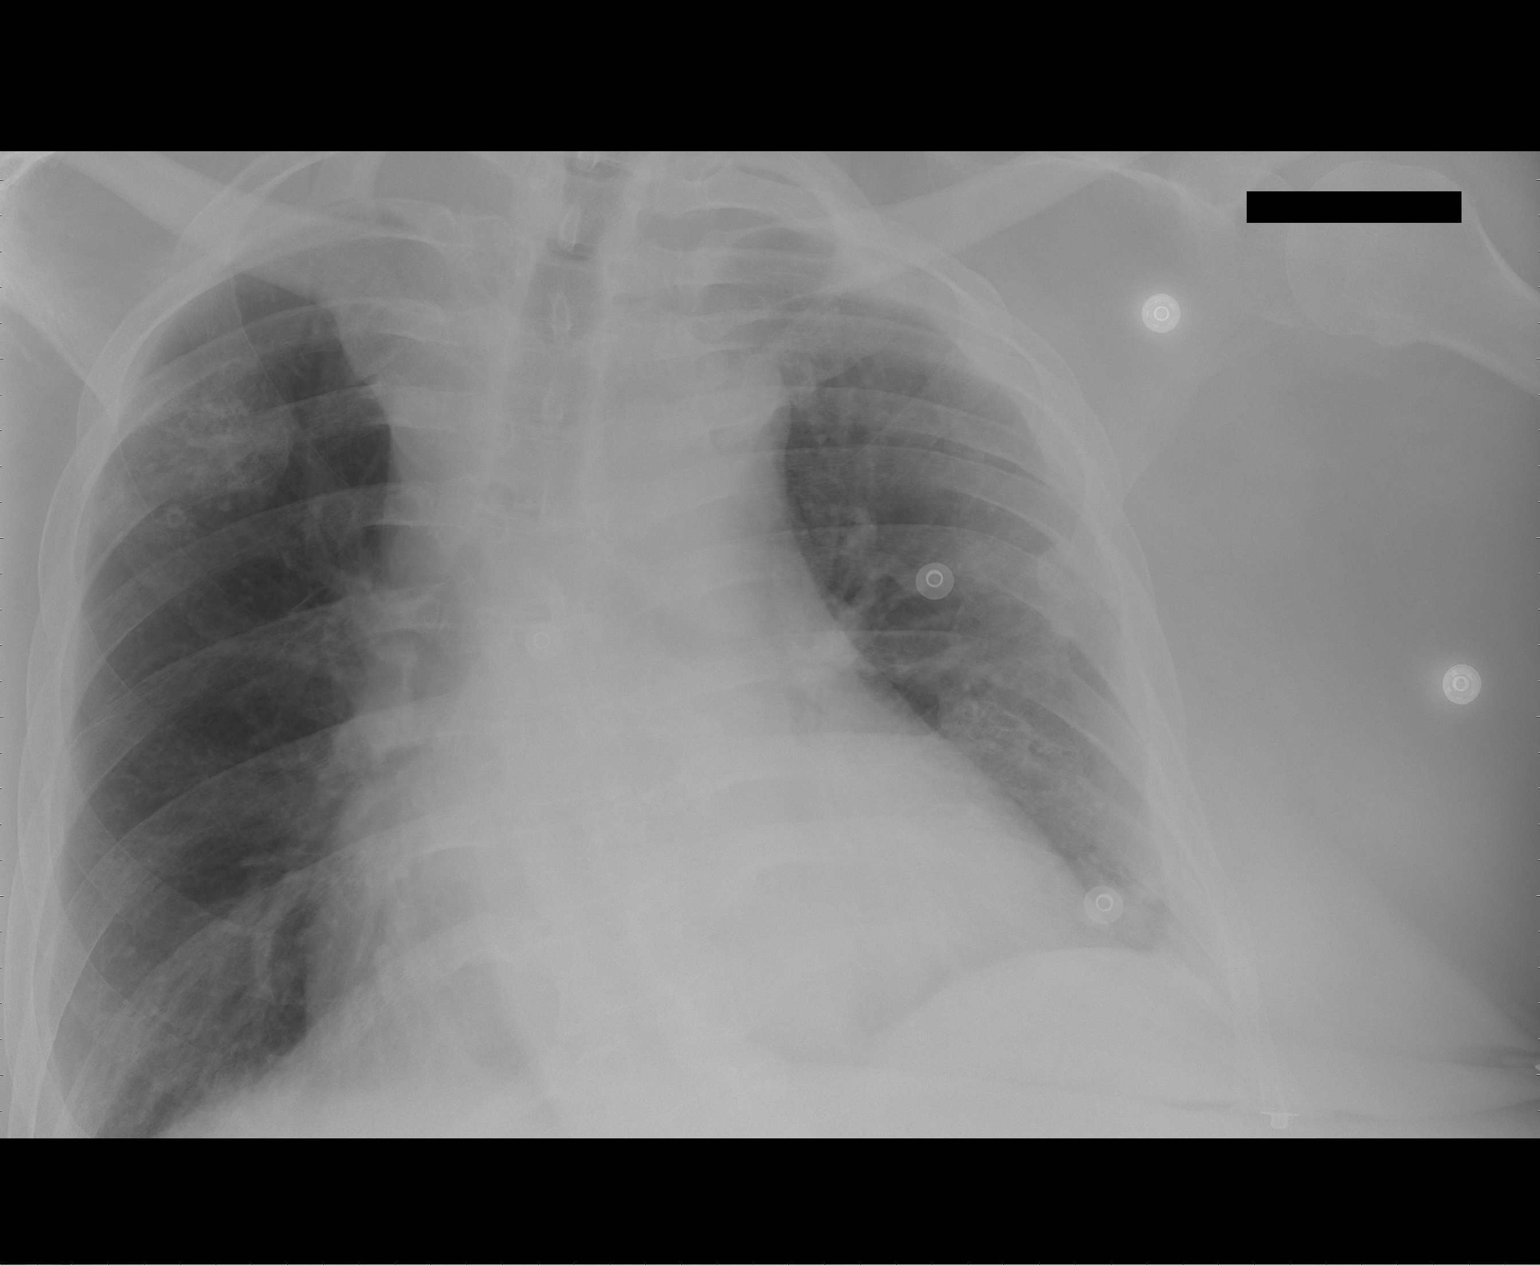

[1 of 1 positions shown; findings below may reference images not displayed]

FINDINGS: Shallow inspiration.  Mild cardiac enlargement with
normal pulmonary vascularity.  Patchy calcifications projected over
the right upper lung correspond to soft tissue calcifications on
previous chest CT from 10/26/2010.  Increased density in the right
apex may represent pleural thickening and is stable since previous
study.  Old left rib fractures.  Thoracic scoliosis convex towards
the right.  No focal airspace consolidation.  No blunting of
costophrenic angles.  No pneumothorax.  Interval removal of left
PICC catheter.  Postoperative changes in the cervical spine.
IMPRESSION: No evidence of active pulmonary disease.  Chronic changes as
discussed.  Mild cardiac enlargement.

## 2014-10-30 NOTE — Telephone Encounter (Signed)
Patient called to let [name] know that he will be having surgery on 12/08/14. MD wanted him on antibiotics prior to the surgery; please advise.

## 2014-11-04 ENCOUNTER — Encounter: Payer: Self-pay | Admitting: Hematology

## 2014-11-04 ENCOUNTER — Telehealth: Payer: Self-pay | Admitting: Hematology

## 2014-11-04 ENCOUNTER — Ambulatory Visit (HOSPITAL_BASED_OUTPATIENT_CLINIC_OR_DEPARTMENT_OTHER): Payer: Medicare Other | Admitting: Hematology

## 2014-11-04 ENCOUNTER — Telehealth: Payer: Self-pay | Admitting: Licensed Clinical Social Worker

## 2014-11-04 ENCOUNTER — Telehealth: Payer: Self-pay | Admitting: *Deleted

## 2014-11-04 ENCOUNTER — Other Ambulatory Visit (HOSPITAL_BASED_OUTPATIENT_CLINIC_OR_DEPARTMENT_OTHER): Payer: Medicare Other

## 2014-11-04 VITALS — BP 109/66 | HR 73 | Temp 98.1°F | Resp 18 | Ht 72.0 in

## 2014-11-04 DIAGNOSIS — D638 Anemia in other chronic diseases classified elsewhere: Secondary | ICD-10-CM

## 2014-11-04 DIAGNOSIS — D649 Anemia, unspecified: Secondary | ICD-10-CM

## 2014-11-04 DIAGNOSIS — D509 Iron deficiency anemia, unspecified: Secondary | ICD-10-CM

## 2014-11-04 LAB — CBC & DIFF AND RETIC
BASO%: 0.2 % (ref 0.0–2.0)
Basophils Absolute: 0 10*3/uL (ref 0.0–0.1)
EOS%: 1.7 % (ref 0.0–7.0)
Eosinophils Absolute: 0.2 10*3/uL (ref 0.0–0.5)
HCT: 33 % — ABNORMAL LOW (ref 38.4–49.9)
HGB: 10.5 g/dL — ABNORMAL LOW (ref 13.0–17.1)
IMMATURE RETIC FRACT: 4.3 % (ref 3.00–10.60)
LYMPH%: 11.8 % — ABNORMAL LOW (ref 14.0–49.0)
MCH: 24.4 pg — AB (ref 27.2–33.4)
MCHC: 31.8 g/dL — ABNORMAL LOW (ref 32.0–36.0)
MCV: 76.6 fL — AB (ref 79.3–98.0)
MONO#: 1.2 10*3/uL — ABNORMAL HIGH (ref 0.1–0.9)
MONO%: 9.9 % (ref 0.0–14.0)
NEUT#: 9.2 10*3/uL — ABNORMAL HIGH (ref 1.5–6.5)
NEUT%: 76.4 % — AB (ref 39.0–75.0)
PLATELETS: 440 10*3/uL — AB (ref 140–400)
RBC: 4.31 10*6/uL (ref 4.20–5.82)
RDW: 20.6 % — ABNORMAL HIGH (ref 11.0–14.6)
Retic %: 0.8 % (ref 0.80–1.80)
Retic Ct Abs: 34.48 10*3/uL — ABNORMAL LOW (ref 34.80–93.90)
WBC: 12.1 10*3/uL — AB (ref 4.0–10.3)
lymph#: 1.4 10*3/uL (ref 0.9–3.3)

## 2014-11-04 LAB — IRON AND TIBC CHCC
%SAT: 14 % — ABNORMAL LOW (ref 20–55)
IRON: 19 ug/dL — AB (ref 42–163)
TIBC: 138 ug/dL — AB (ref 202–409)
UIBC: 119 ug/dL (ref 117–376)

## 2014-11-04 NOTE — Telephone Encounter (Signed)
4 weeks before surgery would be next week.  Check with [name] then to be set up for Monday the 27th.  I have no idea what antibiotic he was thinking of.

## 2014-11-04 NOTE — Telephone Encounter (Signed)
Per staff phone call and POF I have schedueld appts. Scheduler advised of appts.  JMW  

## 2014-11-04 NOTE — Telephone Encounter (Signed)
[name] please advise.

## 2014-11-04 NOTE — Telephone Encounter (Signed)
Patient called stating that his surgery for his flap for decubitis ulcer is 12/08/14 and per Dr. Algis Downs note he recommended him to have PICC placed and antibiotics started 4 weeks prior to surgery.  Please advise if we are to set up and if so what antibiotics? Patient is wanting this setup.
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
## 2014-11-04 NOTE — Telephone Encounter (Signed)
error 

## 2014-11-04 NOTE — Telephone Encounter (Signed)
per pof to sch pt appt-ME sch gera appts-gave pt copy of avs

## 2014-11-05 ENCOUNTER — Telehealth: Payer: Self-pay | Admitting: Hematology

## 2014-11-05 NOTE — Telephone Encounter (Signed)
s.w. pt and confirmed appt....pt ok and aware °

## 2014-11-06 ENCOUNTER — Encounter: Payer: Self-pay | Admitting: Physical Therapy

## 2014-11-06 ENCOUNTER — Ambulatory Visit: Payer: Medicare Other | Attending: Internal Medicine | Admitting: Physical Therapy

## 2014-11-06 DIAGNOSIS — G8250 Quadriplegia, unspecified: Secondary | ICD-10-CM | POA: Diagnosis present

## 2014-11-06 NOTE — Therapy (Signed)
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 medical provider or manufacturer. The therapist will not receive remuneration of any kind for the equipment recommended in this evaluation.   Patient has mobility limitation that significantly impairs safe, timely participation in one or more mobility related ADL's.  (bathing, toileting, feeding, dressing, grooming, moving from room to room)                                                             [x]  Yes []  No Will mobility device sufficiently improve ability to participate and/or be aided in participation of MRADL's?         [x]  Yes []  No Can limitation be compensated for with use of a cane or walker?                                                                                []  Yes [x]  No Does patient or caregiver demonstrate ability/potential ability & willingness to safely use the mobility device?   [x]  Yes []  No Does patient's home environment support use of recommended mobility device?                                                    [x]  Yes []  No Does patient have sufficient upper extremity function necessary to functionally propel a manual wheelchair?    []  Yes [x]  No Does patient have sufficient strength and trunk stability to safely operate a POV (scooter)?                                  []  Yes [x]  No Does patient need additional features/benefits provided by a power wheelchair for  MRADL's in the home?       [x]  Yes []  No Does the patient demonstrate the ability to safely use  a power wheelchair?                                                              [x]  Yes []  No  Therapist Name Printed: Guido Sander, PT Date: 11-28-2014  Therapist's Signature:   Date:   Supplier's Name Printed: Mammie Lorenzo Date: 2014-11-28  Supplier's Signature:   Date:  Patient/Caregiver Signature:   Date:     This is to certify that I have read this evaluation and do agree with the content within:    Physician's Name Printed: Dr. [name]  Physician's Signature:  Date:                                              G-Codes - 11/28/14 1642    Functional Assessment Tool Used pt. is dependent for all  ADL's and mobility - requires use of power wheelchair for mobility   Functional Limitation Mobility: Walking and moving around   Mobility: Walking and Moving Around Current Status [phone]) At least 80 percent but less than 100 percent impaired, limited or restricted   Mobility: Walking and Moving Around Goal Status [id number]) At least 80 percent but less than 100 percent impaired, limited or restricted   Mobility: Walking and Moving Around Discharge Status [phone]) At least 80 percent but less than 100 percent impaired, limited or restricted       Problem List Patient Active Problem List   Diagnosis Date Noted  . GI bleed 10/08/2014  . Hematemesis 10/08/2014  . Lytic lesion of bone on x-ray 09/03/2014  . Osteomyelitis 09/02/2014  . Open wound of pelvic region with complication   . Sacral decubitus ulcer   . Right groin ulcer 08/28/2014  . Pressure ulcer of right upper back 06/18/2014  . Ileus 04/17/2014  . Wound infection 12/22/2013  . Decubitus ulcer of ischium, stage 3 12/22/2013  . Decubitus ulcer of sacral region, stage 3 12/22/2013  . Decubitus ulcer of back, stage 3 12/22/2013  . Decubitus ulcer of lower extremity, stage 2 12/22/2013  .  UTI (lower urinary tract infection) 06/21/2013  . Severe protein-calorie malnutrition 03/25/2013  . Sepsis 11/19/2012  . Anemia 08/07/2012  . Chronic osteomyelitis, pelvic region and thigh 08/06/2012  . OSA on CPAP 07/11/2012  . Complicated UTI (urinary tract infection) 05/04/2012  . Sacral decubitus ulcer, stage IV 04/22/2012  . Microcytic anemia 04/22/2012  . S/P colostomy 04/22/2012  . Suprapubic catheter 04/22/2012  . Hyponatremia 01/17/2012  . Leukocytosis 01/17/2012  . Seizure disorder   . HTN (hypertension)   . Quadriplegia 07/23/2011  . Obesity 07/19/2011  . Recurrent pneumonia 12/24/2010  . OSA (obstructive sleep apnea) 12/24/2010  . PVD 03/11/2010    Alda Lea, PT 11-28-14, 4:48 PM  [facility] [address] [place], [place], [address] Phone: [phone]   Fax:  [phone]

## 2014-11-07 ENCOUNTER — Ambulatory Visit: Payer: Medicare Other

## 2014-11-07 ENCOUNTER — Ambulatory Visit (HOSPITAL_BASED_OUTPATIENT_CLINIC_OR_DEPARTMENT_OTHER): Payer: Medicare Other

## 2014-11-07 VITALS — BP 110/68 | HR 81 | Temp 98.2°F | Resp 20

## 2014-11-07 DIAGNOSIS — D649 Anemia, unspecified: Secondary | ICD-10-CM

## 2014-11-07 DIAGNOSIS — D509 Iron deficiency anemia, unspecified: Secondary | ICD-10-CM

## 2014-11-07 MED ORDER — SODIUM CHLORIDE 0.9 % IV SOLN
Freq: Once | INTRAVENOUS | Status: AC
  Administered 2014-11-07: 1 mL via INTRAVENOUS

## 2014-11-07 MED ORDER — FERUMOXYTOL INJECTION 510 MG/17 ML
510.0000 mg | Freq: Once | INTRAVENOUS | Status: AC
  Administered 2014-11-07: 510 mg via INTRAVENOUS
  Filled 2014-11-07: qty 17

## 2014-11-07 NOTE — Patient Instructions (Signed)

## 2014-11-07 NOTE — Progress Notes (Signed)
Pt tolerated 1st feraheme well without difficulty

## 2014-11-11 ENCOUNTER — Other Ambulatory Visit: Payer: Self-pay | Admitting: *Deleted

## 2014-11-11 ENCOUNTER — Telehealth: Payer: Self-pay | Admitting: Hematology

## 2014-11-11 ENCOUNTER — Other Ambulatory Visit: Payer: Self-pay | Admitting: Licensed Clinical Social Worker

## 2014-11-11 DIAGNOSIS — M869 Osteomyelitis, unspecified: Secondary | ICD-10-CM

## 2014-11-11 DIAGNOSIS — D649 Anemia, unspecified: Secondary | ICD-10-CM

## 2014-11-11 MED ORDER — FERROUS SULFATE 325 (65 FE) MG PO TBEC: 325.0000 mg | DELAYED_RELEASE_TABLET | Freq: Three times a day (TID) | ORAL | Status: DC

## 2014-11-11 NOTE — Telephone Encounter (Signed)
pt called and needed to know if he needs to get new prescription...i transferred him to desk nurse

## 2014-11-11 NOTE — Telephone Encounter (Signed)
[name] [name] was able to place the order. Just need the meds added to [facility] for short stay.

## 2014-11-11 NOTE — Telephone Encounter (Signed)
Sorry we also need the order for picc line in [facility]

## 2014-11-11 NOTE — Telephone Encounter (Signed)
He needs to be on merrem and vanco until he has surgery He needs PIC line

## 2014-11-11 NOTE — Telephone Encounter (Signed)
Need dosing and length of therapy

## 2014-11-12 ENCOUNTER — Other Ambulatory Visit: Payer: Self-pay | Admitting: Infectious Diseases

## 2014-11-12 DIAGNOSIS — L89154 Pressure ulcer of sacral region, stage 4: Secondary | ICD-10-CM

## 2014-11-13 ENCOUNTER — Ambulatory Visit (HOSPITAL_COMMUNITY): Payer: Medicare Other

## 2014-11-14 ENCOUNTER — Ambulatory Visit: Payer: Medicare Other

## 2014-11-14 NOTE — Patient Instructions (Signed)
Unable to receive Feraheme today due to IV access unobtainable. Please return after PICC insertion for Feraheme.

## 2014-11-14 NOTE — Progress Notes (Signed)
Patient was stuck 5 times for attempts at peripheral IV access. Access was unable to be obtained. Noted patient is scheduled for PICC insertion on Tuesday July 5th. Appointment for Shirlean Kelly will be rescheduled to July 6th. Email message sent to Dr. Burr Medico and her nurses.

## 2014-11-15 ENCOUNTER — Emergency Department (HOSPITAL_COMMUNITY)
Admission: EM | Admit: 2014-11-15 | Discharge: 2014-11-15 | Disposition: A | Discharge status: Home / Self Care | Payer: Medicare Other | Attending: Emergency Medicine | Admitting: Emergency Medicine

## 2014-11-15 ENCOUNTER — Encounter (HOSPITAL_COMMUNITY): Payer: Self-pay | Admitting: Emergency Medicine

## 2014-11-15 ENCOUNTER — Emergency Department (HOSPITAL_COMMUNITY): Payer: Medicare Other

## 2014-11-15 DIAGNOSIS — Z8619 Personal history of other infectious and parasitic diseases: Secondary | ICD-10-CM | POA: Diagnosis not present

## 2014-11-15 DIAGNOSIS — Z87440 Personal history of urinary (tract) infections: Secondary | ICD-10-CM | POA: Diagnosis not present

## 2014-11-15 DIAGNOSIS — Z8659 Personal history of other mental and behavioral disorders: Secondary | ICD-10-CM | POA: Insufficient documentation

## 2014-11-15 DIAGNOSIS — Z79899 Other long term (current) drug therapy: Secondary | ICD-10-CM | POA: Diagnosis not present

## 2014-11-15 DIAGNOSIS — R109 Unspecified abdominal pain: Secondary | ICD-10-CM | POA: Diagnosis present

## 2014-11-15 DIAGNOSIS — Z8709 Personal history of other diseases of the respiratory system: Secondary | ICD-10-CM | POA: Diagnosis not present

## 2014-11-15 DIAGNOSIS — D649 Anemia, unspecified: Secondary | ICD-10-CM | POA: Insufficient documentation

## 2014-11-15 DIAGNOSIS — I10 Essential (primary) hypertension: Secondary | ICD-10-CM | POA: Diagnosis not present

## 2014-11-15 DIAGNOSIS — K219 Gastro-esophageal reflux disease without esophagitis: Secondary | ICD-10-CM | POA: Insufficient documentation

## 2014-11-15 DIAGNOSIS — G4733 Obstructive sleep apnea (adult) (pediatric): Secondary | ICD-10-CM | POA: Diagnosis not present

## 2014-11-15 DIAGNOSIS — K3189 Other diseases of stomach and duodenum: Secondary | ICD-10-CM | POA: Diagnosis not present

## 2014-11-15 DIAGNOSIS — Z9981 Dependence on supplemental oxygen: Secondary | ICD-10-CM | POA: Insufficient documentation

## 2014-11-15 DIAGNOSIS — Z792 Long term (current) use of antibiotics: Secondary | ICD-10-CM | POA: Insufficient documentation

## 2014-11-15 DIAGNOSIS — Z872 Personal history of diseases of the skin and subcutaneous tissue: Secondary | ICD-10-CM | POA: Diagnosis not present

## 2014-11-15 DIAGNOSIS — Q899 Congenital malformation, unspecified: Secondary | ICD-10-CM | POA: Diagnosis not present

## 2014-11-15 LAB — URINALYSIS, ROUTINE W REFLEX MICROSCOPIC
BILIRUBIN URINE: NEGATIVE
Glucose, UA: NEGATIVE mg/dL
HGB URINE DIPSTICK: NEGATIVE
KETONES UR: NEGATIVE mg/dL
NITRITE: POSITIVE — AB
Protein, ur: NEGATIVE mg/dL
SPECIFIC GRAVITY, URINE: 1.011 (ref 1.005–1.030)
UROBILINOGEN UA: 0.2 mg/dL (ref 0.0–1.0)
pH: 5.5 (ref 5.0–8.0)

## 2014-11-15 LAB — COMPREHENSIVE METABOLIC PANEL
ALT: 8 U/L — AB (ref 17–63)
AST: 15 U/L (ref 15–41)
Albumin: 2.7 g/dL — ABNORMAL LOW (ref 3.5–5.0)
Alkaline Phosphatase: 76 U/L (ref 38–126)
Anion gap: 7 (ref 5–15)
BILIRUBIN TOTAL: 0.5 mg/dL (ref 0.3–1.2)
BUN: 13 mg/dL (ref 6–20)
CO2: 25 mmol/L (ref 22–32)
Calcium: 8.8 mg/dL — ABNORMAL LOW (ref 8.9–10.3)
Chloride: 103 mmol/L (ref 101–111)
Creatinine, Ser: 0.47 mg/dL — ABNORMAL LOW (ref 0.61–1.24)
Glucose, Bld: 84 mg/dL (ref 65–99)
Potassium: 4.3 mmol/L (ref 3.5–5.1)
SODIUM: 135 mmol/L (ref 135–145)
TOTAL PROTEIN: 7.6 g/dL (ref 6.5–8.1)

## 2014-11-15 LAB — CBC WITH DIFFERENTIAL/PLATELET
BASOS ABS: 0 10*3/uL (ref 0.0–0.1)
Basophils Relative: 0 % (ref 0–1)
EOS ABS: 0.4 10*3/uL (ref 0.0–0.7)
Eosinophils Relative: 4 % (ref 0–5)
HEMATOCRIT: 32.8 % — AB (ref 39.0–52.0)
Hemoglobin: 10.3 g/dL — ABNORMAL LOW (ref 13.0–17.0)
Lymphocytes Relative: 16 % (ref 12–46)
Lymphs Abs: 1.6 10*3/uL (ref 0.7–4.0)
MCH: 23.8 pg — ABNORMAL LOW (ref 26.0–34.0)
MCHC: 31.4 g/dL (ref 30.0–36.0)
MCV: 75.8 fL — ABNORMAL LOW (ref 78.0–100.0)
MONO ABS: 1.3 10*3/uL — AB (ref 0.1–1.0)
Monocytes Relative: 14 % — ABNORMAL HIGH (ref 3–12)
NEUTROS PCT: 66 % (ref 43–77)
Neutro Abs: 6.4 10*3/uL (ref 1.7–7.7)
Platelets: 380 10*3/uL (ref 150–400)
RBC: 4.33 MIL/uL (ref 4.22–5.81)
RDW: 20.1 % — AB (ref 11.5–15.5)
WBC: 9.8 10*3/uL (ref 4.0–10.5)

## 2014-11-15 LAB — URINE MICROSCOPIC-ADD ON

## 2014-11-15 LAB — LIPASE, BLOOD: Lipase: 32 U/L (ref 22–51)

## 2014-11-15 LAB — I-STAT TROPONIN, ED: Troponin i, poc: 0 ng/mL (ref 0.00–0.08)

## 2014-11-15 MED ORDER — ONDANSETRON 4 MG PO TBDP
4.0000 mg | ORAL_TABLET | Freq: Once | ORAL | Status: AC
  Administered 2014-11-15: 4 mg via ORAL
  Filled 2014-11-15: qty 1

## 2014-11-15 MED ORDER — PROMETHAZINE HCL 25 MG/ML IJ SOLN
12.5000 mg | Freq: Once | INTRAMUSCULAR | Status: AC
  Administered 2014-11-15: 12.5 mg via INTRAMUSCULAR
  Filled 2014-11-15: qty 1

## 2014-11-15 MED ORDER — SODIUM CHLORIDE 0.9 % IV SOLN: INTRAVENOUS | Status: DC

## 2014-11-15 MED ORDER — PANTOPRAZOLE SODIUM 40 MG PO TBEC
40.0000 mg | DELAYED_RELEASE_TABLET | Freq: Every day | ORAL | Status: DC
  Administered 2014-11-15: 40 mg via ORAL
  Filled 2014-11-15: qty 1

## 2014-11-15 MED ORDER — IOHEXOL 300 MG/ML  SOLN
50.0000 mL | Freq: Once | INTRAMUSCULAR | Status: AC | PRN
  Administered 2014-11-15: 50 mL via ORAL

## 2014-11-15 NOTE — ED Provider Notes (Signed)
Patient discussed with Dr. [name]. The patient has gastric distention on his CT. Recent diagnosis of ulcer disease. Off of his meds for the last 10 days. 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 Not vomiting. Still passing liquids and flatus through his ostomy. Plan is home, metoclopramide, BMP Protonix, small frequent meals, primary care follow-up.  Tanna Furry, MD 11/15/14 [phone]

## 2014-11-15 NOTE — ED Notes (Signed)
IV team at bedside 

## 2014-11-15 NOTE — ED Notes (Signed)
Pt arrives via EMs from home is quadriplegic began having upper abdominal pain and nausea last night approx 8pm. Pt reports hx of gastric and esophageal ulcers. Out of Protonix currently. Denies fever. LBM today, normal.

## 2014-11-15 NOTE — Discharge Instructions (Signed)
Small frequent meals.  Do not eat large meals. Continue Protonix am and pm Metoclopramide before each meal and before bed. Follow up with your PCP.

## 2014-11-15 NOTE — Progress Notes (Signed)
Attempt PIV for CT scan.  Assess both arms fully with ultrasound.  Unable to find any suitable veins.

## 2014-11-15 NOTE — ED Notes (Signed)
PT[facility] Called @ [phone].
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
## 2014-11-18 ENCOUNTER — Ambulatory Visit (HOSPITAL_COMMUNITY)
Admission: RE | Admit: 2014-11-18 | Discharge: 2014-11-18 | Disposition: A | Discharge status: Home / Self Care | Payer: Medicare Other | Source: Ambulatory Visit | Attending: Infectious Diseases | Admitting: Infectious Diseases

## 2014-11-18 ENCOUNTER — Telehealth: Payer: Self-pay | Admitting: *Deleted

## 2014-11-18 ENCOUNTER — Encounter (HOSPITAL_COMMUNITY): Payer: Self-pay | Admitting: Emergency Medicine

## 2014-11-18 ENCOUNTER — Emergency Department (HOSPITAL_COMMUNITY)
Admission: EM | Admit: 2014-11-18 | Discharge: 2014-11-18 | Disposition: A | Discharge status: Home / Self Care | Payer: Medicare Other | Attending: Emergency Medicine | Admitting: Emergency Medicine

## 2014-11-18 ENCOUNTER — Other Ambulatory Visit: Payer: Self-pay | Admitting: Infectious Diseases

## 2014-11-18 DIAGNOSIS — Z933 Colostomy status: Secondary | ICD-10-CM | POA: Diagnosis not present

## 2014-11-18 DIAGNOSIS — I10 Essential (primary) hypertension: Secondary | ICD-10-CM | POA: Insufficient documentation

## 2014-11-18 DIAGNOSIS — M869 Osteomyelitis, unspecified: Secondary | ICD-10-CM | POA: Diagnosis present

## 2014-11-18 DIAGNOSIS — Z872 Personal history of diseases of the skin and subcutaneous tissue: Secondary | ICD-10-CM | POA: Insufficient documentation

## 2014-11-18 DIAGNOSIS — Z9981 Dependence on supplemental oxygen: Secondary | ICD-10-CM | POA: Insufficient documentation

## 2014-11-18 DIAGNOSIS — F329 Major depressive disorder, single episode, unspecified: Secondary | ICD-10-CM | POA: Diagnosis not present

## 2014-11-18 DIAGNOSIS — Z79899 Other long term (current) drug therapy: Secondary | ICD-10-CM | POA: Insufficient documentation

## 2014-11-18 DIAGNOSIS — Z8701 Personal history of pneumonia (recurrent): Secondary | ICD-10-CM | POA: Diagnosis not present

## 2014-11-18 DIAGNOSIS — K219 Gastro-esophageal reflux disease without esophagitis: Secondary | ICD-10-CM | POA: Diagnosis not present

## 2014-11-18 DIAGNOSIS — Z792 Long term (current) use of antibiotics: Secondary | ICD-10-CM | POA: Diagnosis not present

## 2014-11-18 DIAGNOSIS — Z8709 Personal history of other diseases of the respiratory system: Secondary | ICD-10-CM | POA: Diagnosis not present

## 2014-11-18 DIAGNOSIS — R1013 Epigastric pain: Secondary | ICD-10-CM

## 2014-11-18 DIAGNOSIS — G4733 Obstructive sleep apnea (adult) (pediatric): Secondary | ICD-10-CM | POA: Insufficient documentation

## 2014-11-18 LAB — CBC WITH DIFFERENTIAL/PLATELET
Basophils Absolute: 0 10*3/uL (ref 0.0–0.1)
Basophils Relative: 0 % (ref 0–1)
Eosinophils Absolute: 0.4 10*3/uL (ref 0.0–0.7)
Eosinophils Relative: 3 % (ref 0–5)
HEMATOCRIT: 30.3 % — AB (ref 39.0–52.0)
HEMOGLOBIN: 9.4 g/dL — AB (ref 13.0–17.0)
LYMPHS PCT: 11 % — AB (ref 12–46)
Lymphs Abs: 1.2 10*3/uL (ref 0.7–4.0)
MCH: 24.1 pg — ABNORMAL LOW (ref 26.0–34.0)
MCHC: 31 g/dL (ref 30.0–36.0)
MCV: 77.7 fL — ABNORMAL LOW (ref 78.0–100.0)
MONO ABS: 1.3 10*3/uL — AB (ref 0.1–1.0)
MONOS PCT: 12 % (ref 3–12)
Neutro Abs: 8 10*3/uL — ABNORMAL HIGH (ref 1.7–7.7)
Neutrophils Relative %: 74 % (ref 43–77)
Platelets: 473 10*3/uL — ABNORMAL HIGH (ref 150–400)
RBC: 3.9 MIL/uL — ABNORMAL LOW (ref 4.22–5.81)
RDW: 19.6 % — ABNORMAL HIGH (ref 11.5–15.5)
WBC: 10.8 10*3/uL — AB (ref 4.0–10.5)

## 2014-11-18 LAB — LIPASE, BLOOD: Lipase: 34 U/L (ref 22–51)

## 2014-11-18 LAB — COMPREHENSIVE METABOLIC PANEL
ALT: 8 U/L — AB (ref 17–63)
AST: 13 U/L — AB (ref 15–41)
Albumin: 2.9 g/dL — ABNORMAL LOW (ref 3.5–5.0)
Alkaline Phosphatase: 87 U/L (ref 38–126)
Anion gap: 9 (ref 5–15)
BILIRUBIN TOTAL: 0.4 mg/dL (ref 0.3–1.2)
BUN: 13 mg/dL (ref 6–20)
CHLORIDE: 107 mmol/L (ref 101–111)
CO2: 24 mmol/L (ref 22–32)
Calcium: 8.7 mg/dL — ABNORMAL LOW (ref 8.9–10.3)
Creatinine, Ser: 0.57 mg/dL — ABNORMAL LOW (ref 0.61–1.24)
Glucose, Bld: 108 mg/dL — ABNORMAL HIGH (ref 65–99)
Potassium: 3.6 mmol/L (ref 3.5–5.1)
Sodium: 140 mmol/L (ref 135–145)
Total Protein: 8 g/dL (ref 6.5–8.1)

## 2014-11-18 MED ORDER — SUCRALFATE 1 G PO TABS
1.0000 g | ORAL_TABLET | Freq: Once | ORAL | Status: AC
  Administered 2014-11-18: 1 g via ORAL
  Filled 2014-11-18: qty 1

## 2014-11-18 MED ORDER — HEPARIN SOD (PORK) LOCK FLUSH 100 UNIT/ML IV SOLN
INTRAVENOUS | Status: AC
  Administered 2014-11-18: 500 [IU]
  Filled 2014-11-18: qty 10

## 2014-11-18 MED ORDER — FAMOTIDINE 20 MG PO TABS
40.0000 mg | ORAL_TABLET | Freq: Once | ORAL | Status: AC
  Administered 2014-11-18: 40 mg via ORAL
  Filled 2014-11-18: qty 2

## 2014-11-18 MED ORDER — LIDOCAINE HCL 1 % IJ SOLN
INTRAMUSCULAR | Status: AC
  Filled 2014-11-18: qty 20

## 2014-11-18 MED ORDER — SUCRALFATE 1 G PO TABS: 1.0000 g | ORAL_TABLET | Freq: Four times a day (QID) | ORAL | Status: DC

## 2014-11-18 MED ORDER — GI COCKTAIL ~~LOC~~
30.0000 mL | Freq: Once | ORAL | Status: AC
  Administered 2014-11-18: 30 mL via ORAL
  Filled 2014-11-18: qty 30

## 2014-11-18 NOTE — ED Notes (Signed)
Per [facility] order per [name] to have PICC line placed today; ED will discharge to IR; IR will discharge via [facility].

## 2014-11-18 NOTE — Telephone Encounter (Signed)
Patient left message to move appt from tomorrow to Friday. I have moved appts and left patient a message

## 2014-11-18 NOTE — Discharge Instructions (Signed)

## 2014-11-18 NOTE — ED Notes (Signed)
[place] at bedside. 

## 2014-11-18 NOTE — ED Notes (Addendum)
Per EMS pt complaint of abdominal pain with continued nausea; evaluated for same Saturday. Was instructed liquid diet; ate salad with [place] dressing and thinks may have aggravated pain. Pt continues to report buttock wound and has appointment for PICC placement and IV antibiotic.

## 2014-11-18 NOTE — Procedures (Signed)
Placement of left arm PICC.  Left brachial vein.  Tip at SVC/RA junction.  Length = 49 cm.  Ready to use.  No immediate complication.  Minimal blood loss.

## 2014-11-18 NOTE — ED Notes (Signed)
Bed: WA25 Expected date:  Expected time:  Means of arrival:  Comments: EMS/abd. pain 
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
## 2014-11-18 NOTE — ED Notes (Signed)
Lynnda Shields at bedside attempting [place] IV.

## 2014-11-18 NOTE — ED Notes (Addendum)
Pt pending discharge at present time; per pt had appointment for PICC line placement and IV antibiotic at [facility] today at 1230 and IR from [facility] is trying to have it transferred here. Per [facility] IR will call back to confirm whether pt to be discharged to IR or home.

## 2014-11-18 NOTE — Discharge Instructions (Signed)
PICC Home Guide A peripherally inserted central catheter (PICC) is a long, thin, flexible tube that is inserted into a vein in the upper arm. It is a form of intravenous (IV) access. It is considered to be a "central" line because the tip of the PICC ends in a large vein in your chest. This large vein is called the superior vena cava (SVC). The PICC tip ends in the SVC because there is a lot of blood flow in the SVC. This allows medicines and IV fluids to be quickly distributed throughout the body. The PICC is inserted using a sterile technique by a specially trained nurse or physician. After the PICC is inserted, a chest X-ray exam is done to be sure it is in the correct place.  A PICC may be placed for different reasons, such as:  To give medicines and liquid nutrition that can only be given through a central line. Examples are:  Certain antibiotic treatments.  Chemotherapy.  Total parenteral nutrition (TPN).  To take frequent blood samples.  To give IV fluids and blood products.  If there is difficulty placing a peripheral intravenous (PIV) catheter. If taken care of properly, a PICC can remain in place for several months. A PICC can also allow a person to go home from the hospital early. Medicine and PICC care can be managed at home by a family member or home health care team. WHAT PROBLEMS CAN HAPPEN WHEN I HAVE A PICC? Problems with a PICC can occasionally occur. These may include the following:  A blood clot (thrombus) forming in or at the tip of the PICC. This can cause the PICC to become clogged. A clot-dissolving medicine called tissue plasminogen activator (tPA) can be given through the PICC to help break up the clot.  Inflammation of the vein (phlebitis) in which the PICC is placed. Signs of inflammation may include redness, pain at the insertion site, red streaks, or being able to feel a "cord" in the vein where the PICC is located.  Infection in the PICC or at the insertion  site. Signs of infection may include fever, chills, redness, swelling, or pus drainage from the PICC insertion site.  PICC movement (malposition). The PICC tip may move from its original position due to excessive physical activity, forceful coughing, sneezing, or vomiting.  A break or cut in the PICC. It is important to not use scissors near the PICC.  Nerve or tendon irritation or injury during PICC insertion. WHAT SHOULD I KEEP IN MIND ABOUT ACTIVITIES WHEN I HAVE A PICC?  You may bend your arm and move it freely. If your PICC is near or at the bend of your elbow, avoid activity with repeated motion at the elbow.  Rest at home for the remainder of the day following PICC line insertion.  Avoid lifting heavy objects as instructed by your health care provider.  Avoid using a crutch with the arm on the same side as your PICC. You may need to use a walker. WHAT SHOULD I KNOW ABOUT MY PICC DRESSING?  Keep your PICC bandage (dressing) clean and dry to prevent infection.  Ask your health care provider when you may shower. Ask your health care provider to teach you how to wrap the PICC when you do take a shower.  Change the PICC dressing as instructed by your health care provider.  Change your PICC dressing if it becomes loose or wet. WHAT SHOULD I KNOW ABOUT PICC CARE?  Check the PICC insertion site   daily for leakage, redness, swelling, or pain.  Do not take a bath, swim, or use hot tubs when you have a PICC. Cover PICC line with clear plastic wrap and tape to keep it dry while showering.  Flush the PICC as directed by your health care provider. Let your health care provider know right away if the PICC is difficult to flush or does not flush. Do not use force to flush the PICC.  Do not use a syringe that is less than 10 mL to flush the PICC.  Never pull or tug on the PICC.  Avoid blood pressure checks on the arm with the PICC.  Keep your PICC identification card with you at all  times.  Do not take the PICC out yourself. Only a trained clinical professional should remove the PICC. SEEK IMMEDIATE MEDICAL CARE IF:  Your PICC is accidentally pulled all the way out. If this happens, cover the insertion site with a bandage or gauze dressing. Do not throw the PICC away. Your health care provider will need to inspect it.  Your PICC was tugged or pulled and has partially come out. Do not  push the PICC back in.  There is any type of drainage, redness, or swelling where the PICC enters the skin.  You cannot flush the PICC, it is difficult to flush, or the PICC leaks around the insertion site when it is flushed.  You hear a "flushing" sound when the PICC is flushed.  You have pain, discomfort, or numbness in your arm, shoulder, or jaw on the same side as the PICC.  You feel your heart "racing" or skipping beats.  You notice a hole or tear in the PICC.  You develop chills or a fever. MAKE SURE YOU:   Understand these instructions.  Will watch your condition.  Will get help right away if you are not doing well or get worse. Document Released: 11/06/2002 Document Revised: 09/16/2013 Document Reviewed: 01/07/2013 [employer] Patient Information 2015 [employer]. This information is not intended to replace advice given to you by your health care provider. Make sure you discuss any questions you have with your health care provider.  

## 2014-11-19 ENCOUNTER — Ambulatory Visit: Payer: Medicare Other

## 2014-11-21 ENCOUNTER — Ambulatory Visit (HOSPITAL_BASED_OUTPATIENT_CLINIC_OR_DEPARTMENT_OTHER): Payer: Medicare Other

## 2014-11-21 ENCOUNTER — Ambulatory Visit (HOSPITAL_BASED_OUTPATIENT_CLINIC_OR_DEPARTMENT_OTHER): Payer: Medicare Other | Admitting: Nurse Practitioner

## 2014-11-21 ENCOUNTER — Encounter: Payer: Self-pay | Admitting: Nurse Practitioner

## 2014-11-21 VITALS — BP 120/66 | HR 104 | Resp 20

## 2014-11-21 DIAGNOSIS — Z452 Encounter for adjustment and management of vascular access device: Secondary | ICD-10-CM

## 2014-11-21 DIAGNOSIS — D509 Iron deficiency anemia, unspecified: Secondary | ICD-10-CM | POA: Diagnosis not present

## 2014-11-21 DIAGNOSIS — D649 Anemia, unspecified: Secondary | ICD-10-CM

## 2014-11-21 DIAGNOSIS — T7840XA Allergy, unspecified, initial encounter: Secondary | ICD-10-CM

## 2014-11-21 MED ORDER — SODIUM CHLORIDE 0.9 % IV SOLN
510.0000 mg | Freq: Once | INTRAVENOUS | Status: AC
  Administered 2014-11-21: 510 mg via INTRAVENOUS
  Filled 2014-11-21: qty 17

## 2014-11-21 MED ORDER — METHYLPREDNISOLONE SODIUM SUCC 125 MG IJ SOLR
125.0000 mg | Freq: Once | INTRAMUSCULAR | Status: AC
  Administered 2014-11-21: 125 mg via INTRAVENOUS

## 2014-11-21 MED ORDER — FAMOTIDINE IN NACL 20-0.9 MG/50ML-% IV SOLN
20.0000 mg | Freq: Two times a day (BID) | INTRAVENOUS | Status: DC
  Administered 2014-11-21: 20 mg via INTRAVENOUS

## 2014-11-21 MED ORDER — ALTEPLASE 2 MG IJ SOLR
2.0000 mg | Freq: Once | INTRAMUSCULAR | Status: AC | PRN
  Administered 2014-11-21: 2 mg
  Filled 2014-11-21: qty 2

## 2014-11-21 MED ORDER — SODIUM CHLORIDE 0.9 % IV SOLN
INTRAVENOUS | Status: DC
  Administered 2014-11-21: 16:00:00 via INTRAVENOUS

## 2014-11-21 MED ORDER — DIPHENHYDRAMINE HCL 50 MG/ML IJ SOLN
25.0000 mg | Freq: Once | INTRAMUSCULAR | Status: DC
Start: 2014-11-21 — End: 2014-11-21

## 2014-11-21 MED ORDER — DIPHENHYDRAMINE HCL 50 MG/ML IJ SOLN
50.0000 mg | Freq: Once | INTRAMUSCULAR | Status: AC
  Administered 2014-11-21: 50 mg via INTRAVENOUS

## 2014-11-21 NOTE — Assessment & Plan Note (Signed)
Patient has a history of chronic iron deficiency anemia.  He received his first cycle of Feraheme on 11/07/2014 with no difficulty whatsoever.  He is scheduled to initiate Aranesp injections on 12/04/2014.  Hemoglobin fairly stable at 9.4 today.  Patient developed a fairly severe hypersensitivity reaction with today's second cycle of Feraheme infusion.  Patient became lightheaded and dizzy; with complaint of some chest tightness and mild shortness of breath.  There was questionable near-syncope for a brief few seconds.  Feraheme infusion was held; and O2 place via nasal cannula.  Vital signs revealed hypertension.  Patient was given Benadryl 50 mg, Pepcid 20 mg, and site Medrol 125 mg IV per protocol.  Rapid response are in.  Called for further evaluation and management.  All symptoms eventually cleared completely; and patient was back to his baseline.  All vitals returned to his baseline as well.  Decision was made to hold any further Feraheme infusion today.  Gave instructions to both patient and his caregiver regarding further management with Benadryl 50 mg every 6 hours and Pepcid 20 mg every 12 hours for any mild recurring symptoms.  Also strongly encouraged with patient and his caregiver to return directly to the emergency department or call EMS if patient develops any worsening symptoms whatsoever.

## 2014-11-21 NOTE — Progress Notes (Signed)
1600 - pt reports feeling light headed.  Feraheme infusion stopped.  Pt unresponsive for approximately 15-20 seconds.   1602 - 50 mg Benadryl 1602 - Cyndy Bacon to chairside.  Pt c/o lightheaded, dizziness, COB, tightness in chest, stomach spasms/tightness, face tingling.  Pt placed on 2L O2 by nasal cannula.   1603 - VS - see Epic 1603 - 125 mg solumedrol 1605 - VS - see Epic 1606 - 20 mg pepcid 8588 - Pt noted to be diaphoretic.   1609 - VS - see Epic 1614 - VS - see FOYD 7412 - Rapid Response RN to chairside. 1630 - Pt reports feeling shaky/chills - warm blankets provided. 1705 - VS - see Epic.  Pt reports feeling better.  Per Lynnda Shields - pt ok to discharge home.  Instructed to take Benadryl for symptom recurrence.  If symptoms worsen or do not resolve, pt to go to ED.  Pt and caregiver voiced understanding.

## 2014-11-21 NOTE — Assessment & Plan Note (Addendum)
Patient has a history of chronic iron deficiency anemia.  He received his first cycle of Feraheme on 11/07/2014.  He is scheduled to initiate Aranesp injections on 12/04/2014.  Hemoglobin fairly stable at 9.4 today.  Patient developed a fairly severe hypersensitivity reaction with today's second cycle of Feraheme infusion.  All symptoms managed per hypersensitivity protocol; but decision was made to hold any further Feraheme infusion for the day.  Patient is scheduled to return on 12/04/2014 for labs, visit, and his first Aranesp injection.
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
## 2014-11-24 ENCOUNTER — Encounter (HOSPITAL_BASED_OUTPATIENT_CLINIC_OR_DEPARTMENT_OTHER): Payer: Medicare Other | Attending: Plastic Surgery

## 2014-12-01 ENCOUNTER — Telehealth: Payer: Self-pay | Admitting: Hematology

## 2014-12-01 ENCOUNTER — Telehealth: Payer: Self-pay | Admitting: *Deleted

## 2014-12-01 NOTE — Telephone Encounter (Signed)
Patient called to advise his muscle flap surgery has been cancelled (states it was deemed "too invasive").  He has a PICC and was receiving 4 weeks of pre-op IV antibiotics through 12/07/14.  Please advise if he should continue the IV merrem and vancomycin and if he should keep the PICC or have all discontinued.  Please advise if there should be any oral antibiotics. Landis Gandy, RN

## 2014-12-01 NOTE — Telephone Encounter (Signed)
s.w. pt and confirmed appt....pt ok and aware °

## 2014-12-02 NOTE — Telephone Encounter (Signed)
Verbal order given to [facility] at [facility] to pull PICC per [name].  Patient's vancomycin trough today was 43.7 - pharmacy aware, unsure if this was a true trough.  BUN slightly elevated at 26, creatinine WNL at 0.95, glucose low at 68.  RN spoke with patient, he denied s/s, only reported "feeling a little loopy - I was singing with songs on the TV" for a short period yesterday. This resolved with a meal.  Pt states the surgeon told him the osteomyelitis is in his pelvis, cure would involve . Patient's ESR and CRP remain elevated (71, 7.1). He is scheduled for follow up 8/8.  Please advise if he should be on any oral antibiotics in the mean time.

## 2014-12-02 NOTE — Telephone Encounter (Signed)
He can stop.

## 2014-12-02 NOTE — Telephone Encounter (Signed)
Ok to pull PICC?

## 2014-12-04 ENCOUNTER — Encounter: Payer: Self-pay | Admitting: Hematology

## 2014-12-04 ENCOUNTER — Ambulatory Visit (HOSPITAL_BASED_OUTPATIENT_CLINIC_OR_DEPARTMENT_OTHER): Payer: Medicare Other | Admitting: Hematology

## 2014-12-04 ENCOUNTER — Telehealth: Payer: Self-pay | Admitting: Hematology

## 2014-12-04 ENCOUNTER — Other Ambulatory Visit (HOSPITAL_BASED_OUTPATIENT_CLINIC_OR_DEPARTMENT_OTHER): Payer: Medicare Other

## 2014-12-04 ENCOUNTER — Ambulatory Visit (HOSPITAL_BASED_OUTPATIENT_CLINIC_OR_DEPARTMENT_OTHER): Payer: Medicare Other

## 2014-12-04 VITALS — BP 110/59 | HR 80 | Temp 97.6°F | Resp 19

## 2014-12-04 DIAGNOSIS — D649 Anemia, unspecified: Secondary | ICD-10-CM

## 2014-12-04 DIAGNOSIS — D509 Iron deficiency anemia, unspecified: Secondary | ICD-10-CM

## 2014-12-04 DIAGNOSIS — D638 Anemia in other chronic diseases classified elsewhere: Secondary | ICD-10-CM

## 2014-12-04 DIAGNOSIS — G8250 Quadriplegia, unspecified: Secondary | ICD-10-CM

## 2014-12-04 LAB — CBC & DIFF AND RETIC
BASO%: 0.5 % (ref 0.0–2.0)
Basophils Absolute: 0 10*3/uL (ref 0.0–0.1)
EOS%: 5.3 % (ref 0.0–7.0)
Eosinophils Absolute: 0.4 10*3/uL (ref 0.0–0.5)
HCT: 32.1 % — ABNORMAL LOW (ref 38.4–49.9)
HGB: 10 g/dL — ABNORMAL LOW (ref 13.0–17.1)
Immature Retic Fract: 6.7 % (ref 3.00–10.60)
LYMPH%: 17.7 % (ref 14.0–49.0)
MCH: 24.4 pg — AB (ref 27.2–33.4)
MCHC: 31.2 g/dL — ABNORMAL LOW (ref 32.0–36.0)
MCV: 78.5 fL — AB (ref 79.3–98.0)
MONO#: 0.8 10*3/uL (ref 0.1–0.9)
MONO%: 10.3 % (ref 0.0–14.0)
NEUT#: 5.1 10*3/uL (ref 1.5–6.5)
NEUT%: 66.2 % (ref 39.0–75.0)
Platelets: 376 10*3/uL (ref 140–400)
RBC: 4.09 10*6/uL — ABNORMAL LOW (ref 4.20–5.82)
RDW: 18.2 % — ABNORMAL HIGH (ref 11.0–14.6)
Retic %: 0.99 % (ref 0.80–1.80)
Retic Ct Abs: 40.49 10*3/uL (ref 34.80–93.90)
WBC: 7.8 10*3/uL (ref 4.0–10.3)
lymph#: 1.4 10*3/uL (ref 0.9–3.3)
nRBC: 0 % (ref 0–0)

## 2014-12-04 MED ORDER — DARBEPOETIN ALFA 100 MCG/0.5ML IJ SOSY
100.0000 ug | PREFILLED_SYRINGE | Freq: Once | INTRAMUSCULAR | Status: AC
  Administered 2014-12-04: 100 ug via SUBCUTANEOUS
  Filled 2014-12-04: qty 0.5

## 2014-12-04 NOTE — Patient Instructions (Signed)
Darbepoetin Alfa injection What is this medicine? DARBEPOETIN ALFA (dar be POE e tin AL fa) helps your body make more red blood cells. It is used to treat anemia caused by chronic kidney failure and chemotherapy. This medicine may be used for other purposes; ask your health care provider or pharmacist if you have questions. COMMON BRAND NAME(S): Aranesp What should I tell my health care provider before I take this medicine? They need to know if you have any of these conditions: -blood clotting disorders or history of blood clots -cancer patient not on chemotherapy -cystic fibrosis -heart disease, such as angina, heart failure, or a history of a heart attack -hemoglobin level of 12 g/dL or greater -high blood pressure -low levels of folate, iron, or vitamin B12 -seizures -an unusual or allergic reaction to darbepoetin, erythropoietin, albumin, hamster proteins, latex, other medicines, foods, dyes, or preservatives -pregnant or trying to get pregnant -breast-feeding How should I use this medicine? This medicine is for injection into a vein or under the skin. It is usually given by a health care professional in a hospital or clinic setting. If you get this medicine at home, you will be taught how to prepare and give this medicine. Do not shake the solution before you withdraw a dose. Use exactly as directed. Take your medicine at regular intervals. Do not take your medicine more often than directed. It is important that you put your used needles and syringes in a special sharps container. Do not put them in a trash can. If you do not have a sharps container, call your pharmacist or healthcare provider to get one. Talk to your pediatrician regarding the use of this medicine in children. While this medicine may be used in children as young as 1 year for selected conditions, precautions do apply. Overdosage: If you think you have taken too much of this medicine contact a poison control center or  emergency room at once. NOTE: This medicine is only for you. Do not share this medicine with others. What if I miss a dose? If you miss a dose, take it as soon as you can. If it is almost time for your next dose, take only that dose. Do not take double or extra doses. What may interact with this medicine? Do not take this medicine with any of the following medications: -epoetin alfa This list may not describe all possible interactions. Give your health care provider a list of all the medicines, herbs, non-prescription drugs, or dietary supplements you use. Also tell them if you smoke, drink alcohol, or use illegal drugs. Some items may interact with your medicine. What should I watch for while using this medicine? Visit your prescriber or health care professional for regular checks on your progress and for the needed blood tests and blood pressure measurements. It is especially important for the doctor to make sure your hemoglobin level is in the desired range, to limit the risk of potential side effects and to give you the best benefit. Keep all appointments for any recommended tests. Check your blood pressure as directed. Ask your doctor what your blood pressure should be and when you should contact him or her. As your body makes more red blood cells, you may need to take iron, folic acid, or vitamin B supplements. Ask your doctor or health care provider which products are right for you. If you have kidney disease continue dietary restrictions, even though this medication can make you feel better. Talk with your doctor or health   care professional about the foods you eat and the vitamins that you take. What side effects may I notice from receiving this medicine? Side effects that you should report to your doctor or health care professional as soon as possible: -allergic reactions like skin rash, itching or hives, swelling of the face, lips, or tongue -breathing problems -changes in vision -chest  pain -confusion, trouble speaking or understanding -feeling faint or lightheaded, falls -high blood pressure -muscle aches or pains -pain, swelling, warmth in the leg -rapid weight gain -severe headaches -sudden numbness or weakness of the face, arm or leg -trouble walking, dizziness, loss of balance or coordination -seizures (convulsions) -swelling of the ankles, feet, hands -unusually weak or tired Side effects that usually do not require medical attention (report to your doctor or health care professional if they continue or are bothersome): -diarrhea -fever, chills (flu-like symptoms) -headaches -nausea, vomiting -redness, stinging, or swelling at site where injected This list may not describe all possible side effects. Call your doctor for medical advice about side effects. You may report side effects to FDA at 1-800-FDA-1088. Where should I keep my medicine? Keep out of the reach of children. Store in a refrigerator between 2 and 8 degrees C (36 and 46 degrees F). Do not freeze. Do not shake. Throw away any unused portion if using a single-dose vial. Throw away any unused medicine after the expiration date. NOTE: This sheet is a summary. It may not cover all possible information. If you have questions about this medicine, talk to your doctor, pharmacist, or health care provider.  2015, Elsevier/Gold Standard. (2008-04-15 10:23:57)  
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
## 2014-12-04 NOTE — Telephone Encounter (Signed)
Gave patient avs report and appointments for September.  °

## 2014-12-05 IMAGING — XA IR FLUORO GUIDE CV LINE*R*
1 series · 1 of 1 positions shown · non-contrast
Comparison: none

CLINICAL DATA: 46-year-old with sepsis.

[Series 1: fl cto · 1 of 1 slices shown]
[im 1/1]
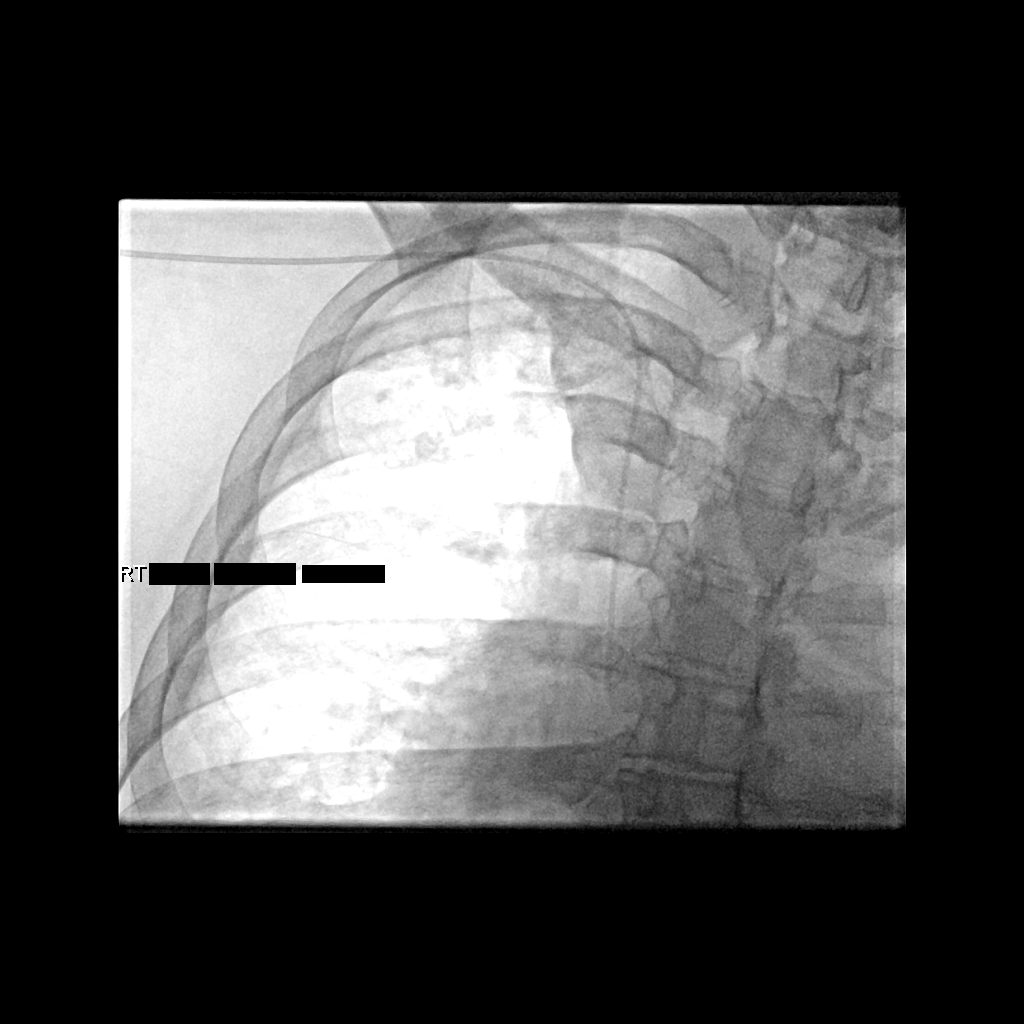

[1 of 1 positions shown; findings below may reference images not displayed]

PICC LINE PLACEMENT WITH ULTRASOUND AND FLUOROSCOPIC  GUIDANCE

Fluoroscopy Time: 1.1 minutes.

The right arm was prepped with chlorhexidine, draped in the usual
sterile fashion using maximum barrier technique (cap and mask,
sterile gown, sterile gloves, large sterile sheet, hand hygiene and
cutaneous antisepsis) and infiltrated locally with 1% Lidocaine.

Ultrasound demonstrated patency of the right brachial vein, and
this was documented with an image.  Under real-time ultrasound
guidance, this vein was accessed with a 21 gauge micropuncture
needle and image documentation was performed.  The needle was
exchanged over a guidewire for a peel-away sheath through which a
five French dual lumen PICC trimmed to 52 cm was advanced,
positioned with its tip at the lower SVC/right atrial junction.
Fluoroscopy during the procedure and fluoro spot radiograph
confirms appropriate catheter position.  The catheter was flushed,
secured to the skin with Prolene sutures, and covered with a
sterile dressing.

Complications:  None
IMPRESSION: Successful right arm PICC line placement with ultrasound and
fluoroscopic guidance.  The catheter is ready for use.

## 2014-12-07 ENCOUNTER — Emergency Department (HOSPITAL_COMMUNITY): Payer: Medicare Other

## 2014-12-07 ENCOUNTER — Encounter (HOSPITAL_COMMUNITY): Payer: Self-pay | Admitting: Emergency Medicine

## 2014-12-07 ENCOUNTER — Inpatient Hospital Stay (HOSPITAL_COMMUNITY)
Admission: EM | Admit: 2014-12-07 | Discharge: 2014-12-15 | DRG: 922 | Disposition: A | Discharge status: Home Health | Payer: Medicare Other | Attending: Internal Medicine | Admitting: Internal Medicine

## 2014-12-07 DIAGNOSIS — M8668 Other chronic osteomyelitis, other site: Secondary | ICD-10-CM | POA: Diagnosis present

## 2014-12-07 DIAGNOSIS — K2970 Gastritis, unspecified, without bleeding: Secondary | ICD-10-CM | POA: Diagnosis present

## 2014-12-07 DIAGNOSIS — J9610 Chronic respiratory failure, unspecified whether with hypoxia or hypercapnia: Secondary | ICD-10-CM | POA: Diagnosis present

## 2014-12-07 DIAGNOSIS — G40909 Epilepsy, unspecified, not intractable, without status epilepticus: Secondary | ICD-10-CM | POA: Diagnosis present

## 2014-12-07 DIAGNOSIS — A419 Sepsis, unspecified organism: Secondary | ICD-10-CM | POA: Diagnosis present

## 2014-12-07 DIAGNOSIS — D509 Iron deficiency anemia, unspecified: Secondary | ICD-10-CM | POA: Diagnosis present

## 2014-12-07 DIAGNOSIS — Z993 Dependence on wheelchair: Secondary | ICD-10-CM

## 2014-12-07 DIAGNOSIS — Y92009 Unspecified place in unspecified non-institutional (private) residence as the place of occurrence of the external cause: Secondary | ICD-10-CM

## 2014-12-07 DIAGNOSIS — Z803 Family history of malignant neoplasm of breast: Secondary | ICD-10-CM

## 2014-12-07 DIAGNOSIS — E662 Morbid (severe) obesity with alveolar hypoventilation: Secondary | ICD-10-CM | POA: Diagnosis present

## 2014-12-07 DIAGNOSIS — B957 Other staphylococcus as the cause of diseases classified elsewhere: Secondary | ICD-10-CM | POA: Diagnosis present

## 2014-12-07 DIAGNOSIS — E860 Dehydration: Secondary | ICD-10-CM | POA: Diagnosis present

## 2014-12-07 DIAGNOSIS — I10 Essential (primary) hypertension: Secondary | ICD-10-CM | POA: Diagnosis present

## 2014-12-07 DIAGNOSIS — Z833 Family history of diabetes mellitus: Secondary | ICD-10-CM

## 2014-12-07 DIAGNOSIS — R509 Fever, unspecified: Secondary | ICD-10-CM

## 2014-12-07 DIAGNOSIS — T670XXA Heatstroke and sunstroke, initial encounter: Principal | ICD-10-CM | POA: Diagnosis present

## 2014-12-07 DIAGNOSIS — E876 Hypokalemia: Secondary | ICD-10-CM | POA: Diagnosis present

## 2014-12-07 DIAGNOSIS — G4733 Obstructive sleep apnea (adult) (pediatric): Secondary | ICD-10-CM | POA: Diagnosis present

## 2014-12-07 DIAGNOSIS — N390 Urinary tract infection, site not specified: Secondary | ICD-10-CM | POA: Diagnosis present

## 2014-12-07 DIAGNOSIS — E43 Unspecified severe protein-calorie malnutrition: Secondary | ICD-10-CM | POA: Diagnosis present

## 2014-12-07 DIAGNOSIS — Z6827 Body mass index (BMI) 27.0-27.9, adult: Secondary | ICD-10-CM

## 2014-12-07 DIAGNOSIS — E871 Hypo-osmolality and hyponatremia: Secondary | ICD-10-CM | POA: Diagnosis present

## 2014-12-07 DIAGNOSIS — T675XXA Heat exhaustion, unspecified, initial encounter: Secondary | ICD-10-CM | POA: Diagnosis present

## 2014-12-07 DIAGNOSIS — Z79899 Other long term (current) drug therapy: Secondary | ICD-10-CM

## 2014-12-07 DIAGNOSIS — M86659 Other chronic osteomyelitis, unspecified thigh: Secondary | ICD-10-CM | POA: Diagnosis present

## 2014-12-07 DIAGNOSIS — K3184 Gastroparesis: Secondary | ICD-10-CM | POA: Diagnosis present

## 2014-12-07 DIAGNOSIS — Z87440 Personal history of urinary (tract) infections: Secondary | ICD-10-CM

## 2014-12-07 DIAGNOSIS — R110 Nausea: Secondary | ICD-10-CM | POA: Diagnosis not present

## 2014-12-07 DIAGNOSIS — Z8701 Personal history of pneumonia (recurrent): Secondary | ICD-10-CM

## 2014-12-07 DIAGNOSIS — R6510 Systemic inflammatory response syndrome (SIRS) of non-infectious origin without acute organ dysfunction: Secondary | ICD-10-CM

## 2014-12-07 DIAGNOSIS — L89154 Pressure ulcer of sacral region, stage 4: Secondary | ICD-10-CM | POA: Diagnosis present

## 2014-12-07 DIAGNOSIS — D638 Anemia in other chronic diseases classified elsewhere: Secondary | ICD-10-CM | POA: Diagnosis present

## 2014-12-07 DIAGNOSIS — G8250 Quadriplegia, unspecified: Secondary | ICD-10-CM | POA: Diagnosis present

## 2014-12-07 DIAGNOSIS — R6521 Severe sepsis with septic shock: Secondary | ICD-10-CM | POA: Diagnosis present

## 2014-12-07 DIAGNOSIS — Z888 Allergy status to other drugs, medicaments and biological substances status: Secondary | ICD-10-CM

## 2014-12-07 DIAGNOSIS — F329 Major depressive disorder, single episode, unspecified: Secondary | ICD-10-CM | POA: Diagnosis present

## 2014-12-07 DIAGNOSIS — K5900 Constipation, unspecified: Secondary | ICD-10-CM | POA: Diagnosis present

## 2014-12-07 DIAGNOSIS — X30XXXA Exposure to excessive natural heat, initial encounter: Secondary | ICD-10-CM | POA: Diagnosis present

## 2014-12-07 DIAGNOSIS — R7881 Bacteremia: Secondary | ICD-10-CM | POA: Insufficient documentation

## 2014-12-07 DIAGNOSIS — Z9359 Other cystostomy status: Secondary | ICD-10-CM

## 2014-12-07 DIAGNOSIS — L97309 Non-pressure chronic ulcer of unspecified ankle with unspecified severity: Secondary | ICD-10-CM | POA: Diagnosis present

## 2014-12-07 DIAGNOSIS — Z8711 Personal history of peptic ulcer disease: Secondary | ICD-10-CM

## 2014-12-07 DIAGNOSIS — Z933 Colostomy status: Secondary | ICD-10-CM

## 2014-12-07 DIAGNOSIS — K210 Gastro-esophageal reflux disease with esophagitis: Secondary | ICD-10-CM | POA: Diagnosis present

## 2014-12-07 LAB — CBC WITH DIFFERENTIAL/PLATELET
Basophils Absolute: 0 10*3/uL (ref 0.0–0.1)
Basophils Relative: 0 % (ref 0–1)
Eosinophils Absolute: 0.7 10*3/uL (ref 0.0–0.7)
Eosinophils Relative: 6 % — ABNORMAL HIGH (ref 0–5)
HCT: 28.9 % — ABNORMAL LOW (ref 39.0–52.0)
Hemoglobin: 9.1 g/dL — ABNORMAL LOW (ref 13.0–17.0)
Lymphocytes Relative: 19 % (ref 12–46)
Lymphs Abs: 2 10*3/uL (ref 0.7–4.0)
MCH: 24.1 pg — AB (ref 26.0–34.0)
MCHC: 31.5 g/dL (ref 30.0–36.0)
MCV: 76.7 fL — ABNORMAL LOW (ref 78.0–100.0)
MONOS PCT: 8 % (ref 3–12)
Monocytes Absolute: 0.9 10*3/uL (ref 0.1–1.0)
Neutro Abs: 7.3 10*3/uL (ref 1.7–7.7)
Neutrophils Relative %: 67 % (ref 43–77)
PLATELETS: 346 10*3/uL (ref 150–400)
RBC: 3.77 MIL/uL — AB (ref 4.22–5.81)
RDW: 16.8 % — ABNORMAL HIGH (ref 11.5–15.5)
WBC: 11 10*3/uL — AB (ref 4.0–10.5)

## 2014-12-07 MED ORDER — SODIUM CHLORIDE 0.9 % IV BOLUS (SEPSIS)
1000.0000 mL | Freq: Once | INTRAVENOUS | Status: AC
  Administered 2014-12-07: 1000 mL via INTRAVENOUS

## 2014-12-07 MED ORDER — ACETAMINOPHEN 500 MG PO TABS
1000.0000 mg | ORAL_TABLET | Freq: Once | ORAL | Status: AC
  Administered 2014-12-07: 1000 mg via ORAL
  Filled 2014-12-07: qty 2

## 2014-12-07 MED ORDER — IBUPROFEN 800 MG PO TABS
800.0000 mg | ORAL_TABLET | Freq: Once | ORAL | Status: AC
  Administered 2014-12-07: 800 mg via ORAL
  Filled 2014-12-07: qty 1

## 2014-12-07 MED ORDER — PIPERACILLIN-TAZOBACTAM 3.375 G IVPB 30 MIN
3.3750 g | Freq: Once | INTRAVENOUS | Status: AC
  Administered 2014-12-07: 3.375 g via INTRAVENOUS
  Filled 2014-12-07: qty 50

## 2014-12-07 MED ORDER — VANCOMYCIN HCL IN DEXTROSE 1-5 GM/200ML-% IV SOLN
1000.0000 mg | Freq: Once | INTRAVENOUS | Status: AC
  Administered 2014-12-08: 1000 mg via INTRAVENOUS
  Filled 2014-12-07: qty 200

## 2014-12-07 NOTE — ED Notes (Signed)
Pt provided with an ice pack on neck.
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
## 2014-12-07 NOTE — ED Notes (Signed)
Cool rag placed on pt's head per request. Alert and oriented. Will recheck temperature later.
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
## 2014-12-07 NOTE — ED Notes (Signed)
RT called for ABG and additional labs. Xray at bedside.

## 2014-12-07 NOTE — ED Notes (Signed)
Pt lives at home and has had nausea since 1pm today. Pt has not had air conditioning in the house x 2 days. No active vomiting. Alert and oriented per norm. Quadriplegic.

## 2014-12-07 NOTE — ED Notes (Signed)
Bed: XL21 Expected date: 12/07/14 Expected time: 9:45 PM Means of arrival: Ambulance Comments: 49 yo M  Nausea, quadraplegic

## 2014-12-07 NOTE — ED Notes (Signed)
Main lab at bedside 

## 2014-12-07 NOTE — ED Notes (Signed)
IV team and lab at bedside.

## 2014-12-08 ENCOUNTER — Inpatient Hospital Stay (HOSPITAL_COMMUNITY): Payer: Medicare Other

## 2014-12-08 ENCOUNTER — Encounter (HOSPITAL_COMMUNITY): Payer: Self-pay | Admitting: Internal Medicine

## 2014-12-08 DIAGNOSIS — J9610 Chronic respiratory failure, unspecified whether with hypoxia or hypercapnia: Secondary | ICD-10-CM | POA: Diagnosis present

## 2014-12-08 DIAGNOSIS — T675XXA Heat exhaustion, unspecified, initial encounter: Secondary | ICD-10-CM | POA: Diagnosis present

## 2014-12-08 DIAGNOSIS — Z87440 Personal history of urinary (tract) infections: Secondary | ICD-10-CM | POA: Diagnosis not present

## 2014-12-08 DIAGNOSIS — M86659 Other chronic osteomyelitis, unspecified thigh: Secondary | ICD-10-CM | POA: Diagnosis present

## 2014-12-08 DIAGNOSIS — R6521 Severe sepsis with septic shock: Secondary | ICD-10-CM | POA: Diagnosis present

## 2014-12-08 DIAGNOSIS — Z888 Allergy status to other drugs, medicaments and biological substances status: Secondary | ICD-10-CM | POA: Diagnosis not present

## 2014-12-08 DIAGNOSIS — X30XXXA Exposure to excessive natural heat, initial encounter: Secondary | ICD-10-CM | POA: Diagnosis present

## 2014-12-08 DIAGNOSIS — T670XXA Heatstroke and sunstroke, initial encounter: Secondary | ICD-10-CM | POA: Diagnosis present

## 2014-12-08 DIAGNOSIS — E43 Unspecified severe protein-calorie malnutrition: Secondary | ICD-10-CM | POA: Diagnosis present

## 2014-12-08 DIAGNOSIS — E860 Dehydration: Secondary | ICD-10-CM | POA: Diagnosis present

## 2014-12-08 DIAGNOSIS — G4733 Obstructive sleep apnea (adult) (pediatric): Secondary | ICD-10-CM | POA: Diagnosis present

## 2014-12-08 DIAGNOSIS — K5900 Constipation, unspecified: Secondary | ICD-10-CM | POA: Diagnosis present

## 2014-12-08 DIAGNOSIS — G40909 Epilepsy, unspecified, not intractable, without status epilepticus: Secondary | ICD-10-CM | POA: Diagnosis present

## 2014-12-08 DIAGNOSIS — Z803 Family history of malignant neoplasm of breast: Secondary | ICD-10-CM | POA: Diagnosis not present

## 2014-12-08 DIAGNOSIS — F329 Major depressive disorder, single episode, unspecified: Secondary | ICD-10-CM | POA: Diagnosis present

## 2014-12-08 DIAGNOSIS — Z79899 Other long term (current) drug therapy: Secondary | ICD-10-CM | POA: Diagnosis not present

## 2014-12-08 DIAGNOSIS — R110 Nausea: Secondary | ICD-10-CM | POA: Diagnosis present

## 2014-12-08 DIAGNOSIS — Z993 Dependence on wheelchair: Secondary | ICD-10-CM | POA: Diagnosis not present

## 2014-12-08 DIAGNOSIS — E876 Hypokalemia: Secondary | ICD-10-CM | POA: Diagnosis present

## 2014-12-08 DIAGNOSIS — N390 Urinary tract infection, site not specified: Secondary | ICD-10-CM | POA: Diagnosis present

## 2014-12-08 DIAGNOSIS — Z6827 Body mass index (BMI) 27.0-27.9, adult: Secondary | ICD-10-CM | POA: Diagnosis not present

## 2014-12-08 DIAGNOSIS — A419 Sepsis, unspecified organism: Secondary | ICD-10-CM | POA: Diagnosis present

## 2014-12-08 DIAGNOSIS — L89154 Pressure ulcer of sacral region, stage 4: Secondary | ICD-10-CM | POA: Diagnosis present

## 2014-12-08 DIAGNOSIS — K3184 Gastroparesis: Secondary | ICD-10-CM | POA: Diagnosis present

## 2014-12-08 DIAGNOSIS — B957 Other staphylococcus as the cause of diseases classified elsewhere: Secondary | ICD-10-CM | POA: Diagnosis present

## 2014-12-08 DIAGNOSIS — E662 Morbid (severe) obesity with alveolar hypoventilation: Secondary | ICD-10-CM | POA: Diagnosis present

## 2014-12-08 DIAGNOSIS — Z833 Family history of diabetes mellitus: Secondary | ICD-10-CM | POA: Diagnosis not present

## 2014-12-08 DIAGNOSIS — G8250 Quadriplegia, unspecified: Secondary | ICD-10-CM | POA: Diagnosis present

## 2014-12-08 DIAGNOSIS — K210 Gastro-esophageal reflux disease with esophagitis: Secondary | ICD-10-CM | POA: Diagnosis present

## 2014-12-08 DIAGNOSIS — D638 Anemia in other chronic diseases classified elsewhere: Secondary | ICD-10-CM | POA: Diagnosis present

## 2014-12-08 DIAGNOSIS — Z933 Colostomy status: Secondary | ICD-10-CM | POA: Diagnosis not present

## 2014-12-08 DIAGNOSIS — Z8711 Personal history of peptic ulcer disease: Secondary | ICD-10-CM | POA: Diagnosis not present

## 2014-12-08 DIAGNOSIS — Z8701 Personal history of pneumonia (recurrent): Secondary | ICD-10-CM | POA: Diagnosis not present

## 2014-12-08 DIAGNOSIS — L97309 Non-pressure chronic ulcer of unspecified ankle with unspecified severity: Secondary | ICD-10-CM | POA: Diagnosis present

## 2014-12-08 DIAGNOSIS — I10 Essential (primary) hypertension: Secondary | ICD-10-CM | POA: Diagnosis present

## 2014-12-08 DIAGNOSIS — M8668 Other chronic osteomyelitis, other site: Secondary | ICD-10-CM | POA: Diagnosis present

## 2014-12-08 DIAGNOSIS — D509 Iron deficiency anemia, unspecified: Secondary | ICD-10-CM | POA: Diagnosis present

## 2014-12-08 DIAGNOSIS — L89159 Pressure ulcer of sacral region, unspecified stage: Secondary | ICD-10-CM | POA: Diagnosis not present

## 2014-12-08 DIAGNOSIS — Y92009 Unspecified place in unspecified non-institutional (private) residence as the place of occurrence of the external cause: Secondary | ICD-10-CM | POA: Diagnosis not present

## 2014-12-08 DIAGNOSIS — K2970 Gastritis, unspecified, without bleeding: Secondary | ICD-10-CM | POA: Diagnosis present

## 2014-12-08 DIAGNOSIS — B965 Pseudomonas (aeruginosa) (mallei) (pseudomallei) as the cause of diseases classified elsewhere: Secondary | ICD-10-CM | POA: Diagnosis not present

## 2014-12-08 DIAGNOSIS — R7881 Bacteremia: Secondary | ICD-10-CM | POA: Diagnosis not present

## 2014-12-08 DIAGNOSIS — R509 Fever, unspecified: Secondary | ICD-10-CM | POA: Diagnosis not present

## 2014-12-08 DIAGNOSIS — E871 Hypo-osmolality and hyponatremia: Secondary | ICD-10-CM | POA: Diagnosis present

## 2014-12-08 LAB — URINALYSIS, ROUTINE W REFLEX MICROSCOPIC
BILIRUBIN URINE: NEGATIVE
Glucose, UA: NEGATIVE mg/dL
Ketones, ur: NEGATIVE mg/dL
Nitrite: NEGATIVE
PROTEIN: NEGATIVE mg/dL
SPECIFIC GRAVITY, URINE: 1.003 — AB (ref 1.005–1.030)
UROBILINOGEN UA: 0.2 mg/dL (ref 0.0–1.0)
pH: 6 (ref 5.0–8.0)

## 2014-12-08 LAB — COMPREHENSIVE METABOLIC PANEL
ALBUMIN: 2.7 g/dL — AB (ref 3.5–5.0)
ALT: 13 U/L — ABNORMAL LOW (ref 17–63)
ALT: 11 U/L — ABNORMAL LOW (ref 17–63)
ANION GAP: 8 (ref 5–15)
AST: 15 U/L (ref 15–41)
AST: 13 U/L — ABNORMAL LOW (ref 15–41)
Albumin: 3.3 g/dL — ABNORMAL LOW (ref 3.5–5.0)
Alkaline Phosphatase: 80 U/L (ref 38–126)
Alkaline Phosphatase: 65 U/L (ref 38–126)
Anion gap: 9 (ref 5–15)
BUN: 32 mg/dL — AB (ref 6–20)
BUN: 28 mg/dL — ABNORMAL HIGH (ref 6–20)
CO2: 22 mmol/L (ref 22–32)
CO2: 23 mmol/L (ref 22–32)
Calcium: 8.4 mg/dL — ABNORMAL LOW (ref 8.9–10.3)
Calcium: 8.1 mg/dL — ABNORMAL LOW (ref 8.9–10.3)
Chloride: 99 mmol/L — ABNORMAL LOW (ref 101–111)
Chloride: 104 mmol/L (ref 101–111)
Creatinine, Ser: 1 mg/dL (ref 0.61–1.24)
Creatinine, Ser: 1.01 mg/dL (ref 0.61–1.24)
GFR calc Af Amer: >60 mL/min (ref >60)
GFR calc Af Amer: >60 mL/min (ref >60)
GFR calc non Af Amer: >60 mL/min (ref >60)
GFR calc non Af Amer: >60 mL/min (ref >60)
GLUCOSE: 94 mg/dL (ref 65–99)
Glucose, Bld: 94 mg/dL (ref 65–99)
Potassium: 3.5 mmol/L (ref 3.5–5.1)
Potassium: 3.4 mmol/L — ABNORMAL LOW (ref 3.5–5.1)
SODIUM: 130 mmol/L — AB (ref 135–145)
SODIUM: 135 mmol/L (ref 135–145)
TOTAL PROTEIN: 7.9 g/dL (ref 6.5–8.1)
Total Bilirubin: 0.6 mg/dL (ref 0.3–1.2)
Total Bilirubin: 0.5 mg/dL (ref 0.3–1.2)
Total Protein: 6.5 g/dL (ref 6.5–8.1)

## 2014-12-08 LAB — BLOOD GAS, ARTERIAL
ACID-BASE DEFICIT: 2.5 mmol/L — AB (ref 0.0–2.0)
Bicarbonate: 21.2 mEq/L (ref 20.0–24.0)
DRAWN BY: 232811
FIO2: 0.21 %
O2 Saturation: 98.5 %
PATIENT TEMPERATURE: 99.5
PCO2 ART: 35.1 mmHg (ref 35.0–45.0)
PO2 ART: 129 mmHg — AB (ref 80.0–100.0)
TCO2: 19.9 mmol/L (ref 0–100)
pH, Arterial: 7.401 (ref 7.350–7.450)

## 2014-12-08 LAB — PREALBUMIN: PREALBUMIN: 15.2 mg/dL — AB (ref 18–38)

## 2014-12-08 LAB — MAGNESIUM: Magnesium: 1.3 mg/dL — ABNORMAL LOW (ref 1.7–2.4)

## 2014-12-08 LAB — CBC
HCT: 24.5 % — ABNORMAL LOW (ref 39.0–52.0)
HEMOGLOBIN: 7.4 g/dL — AB (ref 13.0–17.0)
MCH: 23.2 pg — ABNORMAL LOW (ref 26.0–34.0)
MCHC: 30.2 g/dL (ref 30.0–36.0)
MCV: 76.8 fL — AB (ref 78.0–100.0)
Platelets: 309 10*3/uL (ref 150–400)
RBC: 3.19 MIL/uL — ABNORMAL LOW (ref 4.22–5.81)
RDW: 16.7 % — ABNORMAL HIGH (ref 11.5–15.5)
WBC: 9.2 10*3/uL (ref 4.0–10.5)

## 2014-12-08 LAB — TSH: TSH: 2.141 u[IU]/mL (ref 0.350–4.500)

## 2014-12-08 LAB — I-STAT CG4 LACTIC ACID, ED: Lactic Acid, Venous: <0.3 mmol/L — ABNORMAL LOW (ref 0.5–2.0)

## 2014-12-08 LAB — LACTIC ACID, PLASMA
Lactic Acid, Venous: 0.8 mmol/L (ref 0.5–2.0)
Lactic Acid, Venous: 1.2 mmol/L (ref 0.5–2.0)

## 2014-12-08 LAB — PROCALCITONIN: Procalcitonin: 0.13 ng/mL

## 2014-12-08 LAB — URINE MICROSCOPIC-ADD ON

## 2014-12-08 LAB — PHOSPHORUS: Phosphorus: 4 mg/dL (ref 2.5–4.6)

## 2014-12-08 MED ORDER — ADULT MULTIVITAMIN W/MINERALS CH
1.0000 | ORAL_TABLET | Freq: Every day | ORAL | Status: DC
  Administered 2014-12-08 – 2014-12-15 (×8): 1 via ORAL
  Filled 2014-12-08 (×8): qty 1

## 2014-12-08 MED ORDER — ACETAMINOPHEN 650 MG RE SUPP: 650.0000 mg | Freq: Four times a day (QID) | RECTAL | Status: DC | PRN

## 2014-12-08 MED ORDER — HYDROCODONE-ACETAMINOPHEN 5-325 MG PO TABS: 1.0000 | ORAL_TABLET | ORAL | Status: DC | PRN

## 2014-12-08 MED ORDER — CALCIUM CARBONATE ANTACID 500 MG PO CHEW
1.0000 | CHEWABLE_TABLET | Freq: Three times a day (TID) | ORAL | Status: DC | PRN
  Administered 2014-12-08: 200 mg via ORAL
  Filled 2014-12-08: qty 1

## 2014-12-08 MED ORDER — DARIFENACIN HYDROBROMIDE ER 7.5 MG PO TB24
7.5000 mg | ORAL_TABLET | Freq: Every day | ORAL | Status: DC
  Administered 2014-12-08 – 2014-12-15 (×8): 7.5 mg via ORAL
  Filled 2014-12-08 (×10): qty 1

## 2014-12-08 MED ORDER — MAGNESIUM SULFATE 2 GM/50ML IV SOLN
2.0000 g | Freq: Once | INTRAVENOUS | Status: AC
  Administered 2014-12-08: 2 g via INTRAVENOUS
  Filled 2014-12-08: qty 50

## 2014-12-08 MED ORDER — SODIUM CHLORIDE 0.9 % IV BOLUS (SEPSIS)
1000.0000 mL | Freq: Once | INTRAVENOUS | Status: AC
  Administered 2014-12-08: 1000 mL via INTRAVENOUS

## 2014-12-08 MED ORDER — SUCRALFATE 1 G PO TABS
1.0000 g | ORAL_TABLET | Freq: Four times a day (QID) | ORAL | Status: DC
  Administered 2014-12-08 – 2014-12-15 (×30): 1 g via ORAL
  Filled 2014-12-08 (×35): qty 1

## 2014-12-08 MED ORDER — ACETAMINOPHEN 325 MG PO TABS: 650.0000 mg | ORAL_TABLET | Freq: Four times a day (QID) | ORAL | Status: DC | PRN

## 2014-12-08 MED ORDER — IOHEXOL 300 MG/ML  SOLN
100.0000 mL | Freq: Once | INTRAMUSCULAR | Status: AC | PRN
  Administered 2014-12-08: 100 mL via INTRAVENOUS

## 2014-12-08 MED ORDER — POTASSIUM CHLORIDE CRYS ER 20 MEQ PO TBCR
40.0000 meq | EXTENDED_RELEASE_TABLET | Freq: Once | ORAL | Status: AC
  Administered 2014-12-08: 40 meq via ORAL
  Filled 2014-12-08: qty 2

## 2014-12-08 MED ORDER — ENOXAPARIN SODIUM 40 MG/0.4ML ~~LOC~~ SOLN
40.0000 mg | SUBCUTANEOUS | Status: DC
  Administered 2014-12-08 – 2014-12-15 (×8): 40 mg via SUBCUTANEOUS
  Filled 2014-12-08 (×8): qty 0.4

## 2014-12-08 MED ORDER — SODIUM CHLORIDE 0.9 % IJ SOLN
3.0000 mL | Freq: Two times a day (BID) | INTRAMUSCULAR | Status: DC
  Administered 2014-12-08: 3 mL via INTRAVENOUS

## 2014-12-08 MED ORDER — JUVEN PO PACK
1.0000 | PACK | Freq: Two times a day (BID) | ORAL | Status: DC
  Administered 2014-12-09 – 2014-12-15 (×14): 1 via ORAL
  Filled 2014-12-08 (×17): qty 1

## 2014-12-08 MED ORDER — POTASSIUM CHLORIDE CRYS ER 20 MEQ PO TBCR: 40.0000 meq | EXTENDED_RELEASE_TABLET | Freq: Two times a day (BID) | ORAL | Status: DC

## 2014-12-08 MED ORDER — PIPERACILLIN-TAZOBACTAM 3.375 G IVPB 30 MIN: 3.3750 g | Freq: Three times a day (TID) | INTRAVENOUS | Status: DC

## 2014-12-08 MED ORDER — PIPERACILLIN-TAZOBACTAM 3.375 G IVPB
3.3750 g | Freq: Three times a day (TID) | INTRAVENOUS | Status: DC
  Administered 2014-12-08 – 2014-12-15 (×23): 3.375 g via INTRAVENOUS
  Filled 2014-12-08 (×24): qty 50

## 2014-12-08 MED ORDER — IOHEXOL 300 MG/ML  SOLN
50.0000 mL | Freq: Once | INTRAMUSCULAR | Status: AC | PRN
  Administered 2014-12-08: 50 mL via ORAL

## 2014-12-08 MED ORDER — BACLOFEN 10 MG PO TABS
20.0000 mg | ORAL_TABLET | Freq: Four times a day (QID) | ORAL | Status: DC
  Administered 2014-12-08 (×4): 20 mg via ORAL
  Filled 2014-12-08 (×4): qty 2

## 2014-12-08 MED ORDER — METOCLOPRAMIDE HCL 5 MG/ML IJ SOLN
5.0000 mg | Freq: Four times a day (QID) | INTRAMUSCULAR | Status: DC
  Administered 2014-12-08 – 2014-12-15 (×29): 5 mg via INTRAVENOUS
  Filled 2014-12-08 (×29): qty 2

## 2014-12-08 MED ORDER — MAGNESIUM CITRATE PO SOLN
1.0000 | Freq: Once | ORAL | Status: AC
  Administered 2014-12-08: 1 via ORAL

## 2014-12-08 MED ORDER — ONDANSETRON HCL 4 MG/2ML IJ SOLN
4.0000 mg | Freq: Four times a day (QID) | INTRAMUSCULAR | Status: DC | PRN
  Administered 2014-12-08: 4 mg via INTRAVENOUS
  Filled 2014-12-08: qty 2

## 2014-12-08 MED ORDER — PANTOPRAZOLE SODIUM 40 MG IV SOLR
40.0000 mg | Freq: Every day | INTRAVENOUS | Status: DC
  Administered 2014-12-08: 40 mg via INTRAVENOUS
  Filled 2014-12-08: qty 40

## 2014-12-08 MED ORDER — DOCUSATE SODIUM 100 MG PO CAPS: 100.0000 mg | ORAL_CAPSULE | Freq: Two times a day (BID) | ORAL | Status: DC | PRN

## 2014-12-08 MED ORDER — GI COCKTAIL ~~LOC~~
30.0000 mL | Freq: Two times a day (BID) | ORAL | Status: DC | PRN
  Administered 2014-12-08: 30 mL via ORAL
  Filled 2014-12-08: qty 30

## 2014-12-08 MED ORDER — ONDANSETRON HCL 4 MG PO TABS: 4.0000 mg | ORAL_TABLET | Freq: Four times a day (QID) | ORAL | Status: DC | PRN

## 2014-12-08 MED ORDER — SODIUM CHLORIDE 0.9 % IV SOLN
INTRAVENOUS | Status: AC
  Administered 2014-12-08: 04:00:00 via INTRAVENOUS

## 2014-12-08 MED ORDER — BOOST PO LIQD
237.0000 mL | Freq: Three times a day (TID) | ORAL | Status: DC
  Administered 2014-12-08 – 2014-12-15 (×18): 237 mL via ORAL
  Filled 2014-12-08 (×25): qty 237

## 2014-12-08 MED ORDER — SODIUM CHLORIDE 0.9 % IV BOLUS (SEPSIS)
500.0000 mL | Freq: Once | INTRAVENOUS | Status: AC
  Administered 2014-12-08: 500 mL via INTRAVENOUS

## 2014-12-08 NOTE — Progress Notes (Signed)
Upon patient's arrival to unit, BP was low. BP was obtained in all extremities manually and using automatic cuff. BP was consistently in 50s-60s. RRT was paged and hospitalist was called. 1 L bolus was given and BP rose to115/80 with patient in semi-fowlers position. 500 cc bolus was given. BP remains stable for now. Will continue to monitor.

## 2014-12-08 NOTE — Progress Notes (Signed)
Initial Nutrition Assessment  DOCUMENTATION CODES:   Not applicable  INTERVENTION:  - Continue Boost Plus TID, each supplement provides 360 kcal, 14 grams of protein - Will order Juven BID, each supplement provides 80 kcal, 14 grams of amino acids - Will order multivitamin - RD will continue to monitor for needs  NUTRITION DIAGNOSIS:   Increased nutrient needs related to wound healing as evidenced by estimated needs.  GOAL:   Patient will meet greater than or equal to 90% of their needs  MONITOR:   PO intake, Supplement acceptance, Weight trends, Labs  REASON FOR ASSESSMENT:   Malnutrition Screening Tool  ASSESSMENT:  49 y.o. 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   Presented with nausea for one day. he has no a condition in his home for past 2 days with a temperature as going up to 90s inside the house. Patient felt overheated in emergency department was noted to be febrile up to 101.8 with heart rate up to 116, WBC elevated at 11. Patient was also noted to be hypotensive with blood pressure down to 92/45. Met criteria for sepsis. Now BP up 124/78 after 2 L of NS.  Pt seen for MST. BMI indicates overweight status. Pt reports he had breakfast this AM which consisted of egg whites, bagel with cream cheese, and orange juice. PTA he had a good appetite until 2 days ago when his [facility] stopped working and he  began to feel nauseated from the heat. Pt reports that weight was stable PTA. Per weight hx review, pt has lost 25 lbs ( 11% body weight) in the past almost 5 months which is significant for time frame.   At home he would eat breakfast, dinner, and have snacks during the day. He was drinking Boost and using Juven as well as incorporating protein-rich foods in meals and snacks and caregiver would add protein powder to items as well to assist with wound healing.  Unable to determine if pt is meeting needs at this time but not likely. No muscle or fat wasting at this time. Medications reviewed. Labs reviewed; K: 3.4 mmol/L, BUN elevated, Ca: 8.1 mg/dL, Mg: 1.3 mg/dL.    Diet Order:  Diet regular Room service appropriate?: Yes; Fluid consistency:: Thin  Skin:  Stage 4 pressure ulcers to bilateral buttocks and sacrum  Last BM:  PTA  Height:   Ht Readings from Last 1 Encounters:  12/08/14 6' (1.829 m)    Weight:   Wt Readings from Last 1 Encounters:  12/08/14 205 lb 0.4 oz (93 kg)    Ideal Body Weight:  80.91 kg (kg)  Wt Readings from Last 10 Encounters:  12/08/14 205 lb 0.4 oz (93 kg)  11/18/14 250 lb (113.399 kg)  10/15/14 207 lb (93.895 kg)  10/08/14 207 lb 7.3 oz (94.1 kg)  09/08/14 220 lb (99.791 kg)  08/28/14 219 lb (99.338 kg)  07/18/14 230 lb (104.327 kg)  04/17/14 230 lb (104.327 kg)  12/22/13 226 lb (102.513 kg)  09/27/13 275 lb 9.2 oz (125 kg)    BMI:  Body mass index is 27.8 kg/(m^2).  Estimated Nutritional Needs:   Kcal:  2600-2800  Protein:  150-160 grams  Fluid:  2.5 L/day  EDUCATION NEEDS:   No education needs identified at this time     [name], RD, LDN Inpatient Clinical Dietitian Pager # [phone] After hours/weekend pager # [phone]
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
## 2014-12-08 NOTE — Consult Note (Signed)
WOC ostomy consult note Patient well known to our department from previous admissions. Last seen by this writer in May 2016.  States that the reason for this admission is that he became overheated at home. Ostomy (diverting) is stable. Stoma type/location: LLQ colostomy Stomal assessment/size: Not seen today Peristomal assessment: Not seen today Treatment options for stomal/peristomal skin: not seen today; patient reports no leakage using current system Output: dark brown stool with flatus Ostomy pouching: 2pc.  Education provided: No needs at this time except for additional pouching supplies. Supplies ordered to bedside and nursing orders for routine care of ostomy with twice weekly changes.  WOC wound consult note Patient is followed by Dr. [name] (Plastics). It is recommend that he continue to follow up with her for direction and POC in wound care following discharge. Patient is followed by [facility] and has a paid caregiver in the home. 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 Consequently, the expected anatomical landmarks are difficult to ascertain.  Reason for Consult: Provision of therapeutic sleep surface (constant low pressure with low air loss feature), orders for ostomy supplies and wound care orders Wound type:Pressure Pressure Ulcer POA: Yes Measurement: Sacrum: 3cm x 5cm x 0.2cm, clean, pink, non-granulating. Stage 4 Coccyx: 8cm x 91m x 0.2cm, clean, pink, granulating. Stage 4 Right IT: 2.5cm x 3cm x 6cm. Dark red wound base, moist, non-granulating. Stage 4 Right groin: 4cm x 6cm x 0.2cm. Red, moist wound bed, non-granulating. Stage 4 Left IT: 3cm x 3cm x 5cm Red, moist wound bed, non-granulating. Stage 4 Right lateral LE at malleolus (Distal):  4cm x 2cm area with 2 ulcers, the largest of which measures  1.5cm x 1cm x 0.2cm. Red, moist, non-granulating. Full thickness. Stage 3 Right lateral LE at calf (proximal): 0.5cm x 3cm x 0.2cm. Full thickness. 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 Wound bed:As described above Drainage (amount, consistency, odor) All wounds with moderate amounts of serous to light yellow exudate with mild (musty) odor. Periwound: Dressing procedure/placement/frequency: Twice daily saline dressings (conservative care) are implemented while in house. Prevalon boots (bilateral) and therapeutic sleep surface (bariatric CLP with low air loss feature) provided.  Patient instructed to minimize time spent with [facility] elevated above 30 degree angle. He indicates understanding of instruction.  [facility] nursing team will not follow, but will remain available to this patient, the nursing and medical team.  Please re-consult if needed. Thanks, Maudie Flakes, MSN, RN, [place], [place], [place] [facility] [place] [phone])

## 2014-12-08 NOTE — ED Notes (Signed)
MD at bedside. 

## 2014-12-08 NOTE — Progress Notes (Signed)
[facility] PROGRESS NOTE  [name] [name] ZYS:[id number] DOB: 10/25/1965 DOA: 12/07/2014 PCP: [name], MD  Assessment/Plan: 1. Suspected heat exhaustion vs heat stroke 1. History of prolonged exposure to 90+ degree temps indoors with broken air conditioner 2. Continued on IVF as tolerated 3. Body temp normal since admit, was initially elevated 2. Sacral decub ulcer, stage 4 1. Stable 2. WOC consulted 3. Chronic osteomyelitis 1. Stable and chronic 2. No evidence of acute infection on CT 3. Followed by Dr. [name] 4. Severe protein calorie malnutrition 1. Stable 5. Dehydration 1. IVF as tolerated 2. Pt has reported feeling better 6. Questionable sepsis 1. Empiric vancomycin and zosyn was started, will cont for now 2. Pending blood and urine cultures  Code Status: Full Family Communication: Pt in room Disposition Plan: Pending   Consultants:  WOC  Procedures:    Antibiotics:  Vancomycin 7/24>>>  Zosyn 7/24>>>  HPI/Subjective: Feels better today. Denies abd pain  Objective: Filed Vitals:   12/08/14 0502 12/08/14 0607 12/08/14 1006 12/08/14 1413  BP: 115/80 129/73 92/60 126/79  Pulse:   95 93  Temp:   97.7 F (36.5 C) 99.1 F (37.3 C)  TempSrc:   Oral Oral  Resp:   20 20  Height:      Weight:      SpO2:   100% 98%    Intake/Output Summary (Last 24 hours) at 12/08/14 1538 Last data filed at 12/08/14 1420  Gross per 24 hour  Intake    120 ml  Output   3225 ml  Net  -3105 ml   Filed Weights   12/08/14 0321  Weight: 93 kg (205 lb 0.4 oz)    Exam:   General:  Awake, in nad  Cardiovascular: regular, s1, s2  Respiratory: normal resp effort, no wheezing  Abdomen: soft, obese, nondistended  Musculoskeletal: perfused, no clubbing   Data Reviewed: Basic Metabolic Panel:  Recent Labs Lab 12/07/14 2248 12/08/14 0515  NA 130* 135  K 3.5 3.4*  CL 99* 104  CO2 22 23  GLUCOSE 94 94  BUN 32* 28*  CREATININE 1.00 1.01  CALCIUM  8.4* 8.1*  MG  --  1.3*  PHOS  --  4.0   Liver Function Tests:  Recent Labs Lab 12/07/14 2248 12/08/14 0515  AST 15 13*  ALT 13* 11*  ALKPHOS 80 65  BILITOT 0.6 0.5  PROT 7.9 6.5  ALBUMIN 3.3* 2.7*   No results for input(s): LIPASE, AMYLASE in the last 168 hours. No results for input(s): AMMONIA in the last 168 hours. CBC:  Recent Labs Lab 12/04/14 1247 12/07/14 2248 12/08/14 0515  WBC 7.8 11.0* 9.2  NEUTROABS 5.1 7.3  --   HGB 10.0* 9.1* 7.4*  HCT 32.1* 28.9* 24.5*  MCV 78.5* 76.7* 76.8*  PLT 376 346 309   Cardiac Enzymes: No results for input(s): CKTOTAL, CKMB, CKMBINDEX, TROPONINI in the last 168 hours. BNP (last 3 results) No results for input(s): BNP in the last 8760 hours.  ProBNP (last 3 results) No results for input(s): PROBNP in the last 8760 hours.  CBG: No results for input(s): GLUCAP in the last 168 hours.  No results found for this or any previous visit (from the past 240 hour(s)).   Studies: Dg Abd 1 View  12/08/2014   CLINICAL DATA:  Nausea, onset 13:00  EXAM: ABDOMEN - 1 VIEW  COMPARISON:  CT 11/15/2014  FINDINGS: There is prominent gastric distention with air. There is a milder degree of gaseous distention  of small and large bowel. No free air is evident.  IMPRESSION: Gaseous distention of the entire bowel, most prominent in the stomach. This is probably worsened from 11/15/2014. Gastritis or gastric paresis is a possibility. Small bowel obstruction is not likely.   Electronically Signed   By: Andreas Newport M.D.   On: 12/08/2014 00:08   Ct Abdomen Pelvis W Contrast  12/08/2014   CLINICAL DATA:  Fever, nausea. 49 year old male quadriplegic with pressure ulcers.  EXAM: CT ABDOMEN AND PELVIS WITH CONTRAST  TECHNIQUE: Multidetector CT imaging of the abdomen and pelvis was performed using the standard protocol following bolus administration of intravenous contrast.  CONTRAST:  158mL OMNIPAQUE IOHEXOL 300 MG/ML  SOLN  COMPARISON:  Abdominal  radiographs 12/07/2014, most recent CT 11/15/2014  FINDINGS: Lower chest: Bilateral small pleural effusions are noted with associated compressive atelectasis. Heart size upper limits of normal. Trace pericardial fluid.  Hepatobiliary: Liver and gallbladder are grossly unremarkable.  Pancreas: Normal  Spleen: Normal  Adrenals/Urinary Tract: Normal appearance to the adrenal glands and kidneys bilaterally. A suprapubic Foley catheter is in place and the bladder is decompressed. No radiopaque renal or ureteral calculus.  Stomach/Bowel: Normal appendix. Rectal wall thickness at upper limits of normal with overlying decubitus ulcer reidentified. No bowel dilatation. Interval resolution of previously seen gaseous bowel and stomach distension.  Vascular/Lymphatic: Mild atheromatous aortic calcification without aneurysm. Small retroperitoneal nodes are incidentally noted measuring 7 mm and smaller, without lymphadenopathy. Bilateral, right greater than left, inguinal lymph nodes with subjective mild cortical prominence noted but normal size overall.  Other: Left lower quadrant ostomy.  No free air or fluid.  Musculoskeletal: Gross osseous deformity of the proximal femurs and pelvis reidentified with large overlying soft tissue defects and subcutaneous gas compatible with ulceration and underlying osteomyelitis, chronic.  IMPRESSION: No evidence for intra-abdominal or pelvic infectious process.   Electronically Signed   By: Conchita Paris M.D.   On: 12/08/2014 08:29   Dg Chest Port 1 View  12/07/2014   CLINICAL DATA:  Dyspnea.  Recent fever, nausea and hypotension.  EXAM: PORTABLE CHEST - 1 VIEW  COMPARISON:  09/05/2014  FINDINGS: There is mild curvilinear left base opacity due to scarring or atelectasis. There is no confluent airspace consolidation. Heart size is mildly enlarged and unchanged. Pulmonary vasculature is normal. There is no large effusion.  IMPRESSION: Mild linear scarring or atelectasis in the left base.    Electronically Signed   By: Andreas Newport M.D.   On: 12/07/2014 23:47    Scheduled Meds: . baclofen  20 mg Oral QID  . darifenacin  7.5 mg Oral Daily  . enoxaparin (LOVENOX) injection  40 mg Subcutaneous Q24H  . lactose free nutrition  237 mL Oral TID BM  . metoCLOPramide (REGLAN) injection  5 mg Intravenous 4 times per day  . multivitamin with minerals  1 tablet Oral Daily  . nutrition supplement (JUVEN)  1 packet Oral BID BM  . pantoprazole (PROTONIX) IV  40 mg Intravenous QHS  . piperacillin-tazobactam (ZOSYN)  IV  3.375 g Intravenous 3 times per day  . sodium chloride  3 mL Intravenous Q12H  . sucralfate  1 g Oral QID   Continuous Infusions:   Active Problems:   Quadriplegia   Hyponatremia   Seizure disorder   HTN (hypertension)   Sacral decubitus ulcer, stage IV   Suprapubic catheter   Chronic osteomyelitis, pelvic region and thigh   Sepsis   Severe protein-calorie malnutrition   Dehydration   Dorothye Berni,  [facility] Pager [phone]. If 7PM-7AM, please contact night-coverage at [email or web address], password [facility] 12/08/2014, 3:38 PM  LOS: 0 days

## 2014-12-09 DIAGNOSIS — G40909 Epilepsy, unspecified, not intractable, without status epilepticus: Secondary | ICD-10-CM

## 2014-12-09 DIAGNOSIS — G8250 Quadriplegia, unspecified: Secondary | ICD-10-CM

## 2014-12-09 DIAGNOSIS — E871 Hypo-osmolality and hyponatremia: Secondary | ICD-10-CM

## 2014-12-09 DIAGNOSIS — I10 Essential (primary) hypertension: Secondary | ICD-10-CM

## 2014-12-09 LAB — BASIC METABOLIC PANEL
Anion gap: 8 (ref 5–15)
BUN: 19 mg/dL (ref 6–20)
CALCIUM: 8.1 mg/dL — AB (ref 8.9–10.3)
CO2: 24 mmol/L (ref 22–32)
CREATININE: 0.95 mg/dL (ref 0.61–1.24)
Chloride: 106 mmol/L (ref 101–111)
GFR calc Af Amer: >60 mL/min (ref >60)
GFR calc non Af Amer: >60 mL/min (ref >60)
Glucose, Bld: 89 mg/dL (ref 65–99)
POTASSIUM: 3.5 mmol/L (ref 3.5–5.1)
SODIUM: 138 mmol/L (ref 135–145)

## 2014-12-09 LAB — CBC
HCT: 24.8 % — ABNORMAL LOW (ref 39.0–52.0)
HEMOGLOBIN: 7.7 g/dL — AB (ref 13.0–17.0)
MCH: 24.5 pg — ABNORMAL LOW (ref 26.0–34.0)
MCHC: 31 g/dL (ref 30.0–36.0)
MCV: 79 fL (ref 78.0–100.0)
PLATELETS: 302 10*3/uL (ref 150–400)
RBC: 3.14 MIL/uL — ABNORMAL LOW (ref 4.22–5.81)
RDW: 17 % — AB (ref 11.5–15.5)
WBC: 6.2 10*3/uL (ref 4.0–10.5)

## 2014-12-09 LAB — LACTIC ACID, PLASMA
Lactic Acid, Venous: 0.9 mmol/L (ref 0.5–2.0)
Lactic Acid, Venous: 1.6 mmol/L (ref 0.5–2.0)

## 2014-12-09 MED ORDER — SODIUM CHLORIDE 0.9 % IV BOLUS (SEPSIS)
1000.0000 mL | Freq: Once | INTRAVENOUS | Status: AC
  Administered 2014-12-09: 1000 mL via INTRAVENOUS

## 2014-12-09 MED ORDER — VANCOMYCIN HCL 10 G IV SOLR
1500.0000 mg | INTRAVENOUS | Status: AC
  Administered 2014-12-09: 1500 mg via INTRAVENOUS
  Filled 2014-12-09: qty 1500

## 2014-12-09 MED ORDER — SODIUM CHLORIDE 0.9 % IV BOLUS (SEPSIS)
500.0000 mL | Freq: Once | INTRAVENOUS | Status: AC
Start: 2014-12-09 — End: 2014-12-09
  Administered 2014-12-09: 500 mL via INTRAVENOUS

## 2014-12-09 MED ORDER — PANTOPRAZOLE SODIUM 40 MG PO TBEC
40.0000 mg | DELAYED_RELEASE_TABLET | Freq: Every day | ORAL | Status: DC
  Administered 2014-12-09 – 2014-12-15 (×7): 40 mg via ORAL
  Filled 2014-12-09 (×7): qty 1

## 2014-12-09 MED ORDER — VANCOMYCIN HCL 10 G IV SOLR
1250.0000 mg | INTRAVENOUS | Status: DC
  Administered 2014-12-11 – 2014-12-14 (×4): 1250 mg via INTRAVENOUS
  Filled 2014-12-09 (×5): qty 1250

## 2014-12-09 NOTE — Progress Notes (Signed)
CSW received consult re: patient's air conditioning. CSW met with patient who confirmed that the air conditioner has been fixed as of yesterday, but they have been making arrangements to move to another house on Thursday. Patient will need non-emergency ambulance transportation home at discharge. CSW to confirm with patient which address he will be going to once he is discharged.      Raynaldo Opitz, [facility] Clinical Social Worker cell #: [phone]

## 2014-12-09 NOTE — Progress Notes (Addendum)
[facility] PROGRESS NOTE  [name] AVW:[id number] DOB: 09-04-1965 DOA: 12/07/2014 PCP: [name], MD   Off Service Summary: [phone] with hx of paraplegia who presents with fevers and prolonged exposure to 90+ degree temps. The patient was admitted with concerns of heat exhaustion. The patient's course was complicated by bouts of fluid responsive hypotension. Pt later found to have 2/2 gm pos organisms in blood and has been continued on vanc and zosyn. Awaiting speciation of organisms.  Assessment/Plan: 1. Suspected heat exhaustion vs heat stroke 1. History of prolonged exposure to 90+ degree temps indoors with broken air conditioner 2. Continued on IVF as tolerated 3. Pt has remained afebrile since admission 2. GM pos bacteremia 1. Gm pos organisms in clusters noted on 2/2 cultures 2. On empiric vanc and zosyn 3. Await speciation and sensitivities 3. Sacral decub ulcer, stage 4 1. Stable 2. WOC consulted 4. Chronic osteomyelitis 1. Stable and chronic 2. No evidence of acute infection on CT 3. Followed by Dr. [name] 5. Severe protein calorie malnutrition 1. Stable 6. Dehydration 1. IVF as tolerated 2. Pt has reported feeling better 7. Possible sepsis with shock 1. Empiric vancomycin and zosyn was started, will cont for now 2. Pt periodically hypotensive, responsive to IVF boluses 3. Pending lactate - drawn, awaiting results  Code Status: Full Family Communication: Pt in room Disposition Plan: Pending   Consultants:  WOC  Procedures:    Antibiotics:  Vancomycin 7/24>>>  Zosyn 7/24>>>  HPI/Subjective: No complaints. Pt denies abd pain or sob  Objective: Filed Vitals:   12/09/14 0350 12/09/14 0612 12/09/14 1040 12/09/14 1227  BP: 80/44 99/53 79/43  97/59  Pulse:  82 84 80  Temp:  97.5 F (36.4 C) 98.5 F (36.9 C)   TempSrc:  Oral Oral   Resp:  16 20   Height:      Weight:      SpO2:  100% 100%     Intake/Output Summary (Last 24 hours) at  12/09/14 1316 Last data filed at 12/09/14 1000  Gross per 24 hour  Intake    770 ml  Output   2775 ml  Net  -2005 ml   Filed Weights   12/08/14 0321  Weight: 93 kg (205 lb 0.4 oz)    Exam:   General:  Awake, laying in bed talking on phone, in nad  Cardiovascular: regular, s1, s2  Respiratory: normal resp effort, no wheezing  Abdomen: soft, obese, nondistended  Musculoskeletal: perfused, no clubbing, no cyanosis  Data Reviewed: Basic Metabolic Panel:  Recent Labs Lab 12/07/14 2248 12/08/14 0515 12/09/14 0500  NA 130* 135 138  [name] 3.5 3.4* 3.5  CL 99* 104 106  CO2 22 23 24   GLUCOSE 94 94 89  BUN 32* 28* 19  CREATININE 1.00 1.01 0.95  CALCIUM 8.4* 8.1* 8.1*  MG  --  1.3*  --   PHOS  --  4.0  --    Liver Function Tests:  Recent Labs Lab 12/07/14 2248 12/08/14 0515  AST 15 13*  ALT 13* 11*  ALKPHOS 80 65  BILITOT 0.6 0.5  PROT 7.9 6.5  ALBUMIN 3.3* 2.7*   No results for input(s): LIPASE, AMYLASE in the last 168 hours. No results for input(s): AMMONIA in the last 168 hours. CBC:  Recent Labs Lab 12/04/14 1247 12/07/14 2248 12/08/14 0515 12/09/14 0500  WBC 7.8 11.0* 9.2 6.2  NEUTROABS 5.1 7.3  --   --   HGB 10.0* 9.1* 7.4* 7.7*  HCT 32.1*  28.9* 24.5* 24.8*  MCV 78.5* 76.7* 76.8* 79.0  PLT 376 346 309 302   Cardiac Enzymes: No results for input(s): CKTOTAL, CKMB, CKMBINDEX, TROPONINI in the last 168 hours. BNP (last 3 results) No results for input(s): BNP in the last 8760 hours.  ProBNP (last 3 results) No results for input(s): PROBNP in the last 8760 hours.  CBG: No results for input(s): GLUCAP in the last 168 hours.  Recent Results (from the past 240 hour(s))  Blood Culture (routine x 2)     Status: None (Preliminary result)   Collection Time: 12/07/14 10:48 PM  Result Value Ref Range Status   Specimen Description BLOOD RIGHT HAND  Final   Special Requests BOTTLES DRAWN AEROBIC ONLY 3CC  Final   Culture  Setup Time   Final    GRAM  POSITIVE COCCI IN CLUSTERS AEROBIC BOTTLE ONLY CRITICAL RESULT CALLED TO, READ BACK BY AND VERIFIED WITH: Donne Anon RN [id number] GREENR CONFIRMED BY Joaquim Lai Performed at [facility]    Culture PENDING  Incomplete   Report Status PENDING  Incomplete  Blood Culture (routine x 2)     Status: None (Preliminary result)   Collection Time: 12/07/14 11:34 PM  Result Value Ref Range Status   Specimen Description BLOOD RIGHT ARM  Final   Special Requests BOTTLES DRAWN AEROBIC ONLY 3CC  Final   Culture  Setup Time   Final    GRAM POSITIVE COCCI IN CLUSTERS AEROBIC BOTTLE ONLY CRITICAL RESULT CALLED TO, READ BACK BY AND VERIFIED WITH: Donne Anon RN [phone] GREEN R CONFIRMED BY T. [place]  Performed at [facility]    Culture PENDING  Incomplete   Report Status PENDING  Incomplete  Urine culture     Status: None (Preliminary result)   Collection Time: 12/07/14 11:39 PM  Result Value Ref Range Status   Specimen Description URINE, CATHETERIZED  Final   Special Requests NONE  Final   Culture   Final    >=100,000 COLONIES/mL GRAM NEGATIVE RODS Performed at [facility]    Report Status PENDING  Incomplete     Studies: Dg Abd 1 View  12/08/2014   CLINICAL DATA:  Nausea, onset 13:00  EXAM: ABDOMEN - 1 VIEW  COMPARISON:  CT 11/15/2014  FINDINGS: There is prominent gastric distention with air. There is a milder degree of gaseous distention of small and large bowel. No free air is evident.  IMPRESSION: Gaseous distention of the entire bowel, most prominent in the stomach. This is probably worsened from 11/15/2014. Gastritis or gastric paresis is a possibility. Small bowel obstruction is not likely.   Electronically Signed   By: Andreas Newport M.D.   On: 12/08/2014 00:08   Ct Abdomen Pelvis W Contrast  12/08/2014   CLINICAL DATA:  Fever, nausea. 49 year old male quadriplegic with pressure ulcers.  EXAM: CT ABDOMEN AND PELVIS WITH CONTRAST  TECHNIQUE:  Multidetector CT imaging of the abdomen and pelvis was performed using the standard protocol following bolus administration of intravenous contrast.  CONTRAST:  140mL OMNIPAQUE IOHEXOL 300 MG/ML  SOLN  COMPARISON:  Abdominal radiographs 12/07/2014, most recent CT 11/15/2014  FINDINGS: Lower chest: Bilateral small pleural effusions are noted with associated compressive atelectasis. Heart size upper limits of normal. Trace pericardial fluid.  Hepatobiliary: Liver and gallbladder are grossly unremarkable.  Pancreas: Normal  Spleen: Normal  Adrenals/Urinary Tract: Normal appearance to the adrenal glands and kidneys bilaterally. A suprapubic Foley catheter is in place and the bladder is  decompressed. No radiopaque renal or ureteral calculus.  Stomach/Bowel: Normal appendix. Rectal wall thickness at upper limits of normal with overlying decubitus ulcer reidentified. No bowel dilatation. Interval resolution of previously seen gaseous bowel and stomach distension.  Vascular/Lymphatic: Mild atheromatous aortic calcification without aneurysm. Small retroperitoneal nodes are incidentally noted measuring 7 mm and smaller, without lymphadenopathy. Bilateral, right greater than left, inguinal lymph nodes with subjective mild cortical prominence noted but normal size overall.  Other: Left lower quadrant ostomy.  No free air or fluid.  Musculoskeletal: Gross osseous deformity of the proximal femurs and pelvis reidentified with large overlying soft tissue defects and subcutaneous gas compatible with ulceration and underlying osteomyelitis, chronic.  IMPRESSION: No evidence for intra-abdominal or pelvic infectious process.   Electronically Signed   By: Conchita Paris M.D.   On: 12/08/2014 08:29   Dg Chest Port 1 View  12/07/2014   CLINICAL DATA:  Dyspnea.  Recent fever, nausea and hypotension.  EXAM: PORTABLE CHEST - 1 VIEW  COMPARISON:  09/05/2014  FINDINGS: There is mild curvilinear left base opacity due to scarring or  atelectasis. There is no confluent airspace consolidation. Heart size is mildly enlarged and unchanged. Pulmonary vasculature is normal. There is no large effusion.  IMPRESSION: Mild linear scarring or atelectasis in the left base.   Electronically Signed   By: Andreas Newport M.D.   On: 12/07/2014 23:47    Scheduled Meds: . darifenacin  7.5 mg Oral Daily  . enoxaparin (LOVENOX) injection  40 mg Subcutaneous Q24H  . lactose free nutrition  237 mL Oral TID BM  . metoCLOPramide (REGLAN) injection  5 mg Intravenous 4 times per day  . multivitamin with minerals  1 tablet Oral Daily  . nutrition supplement (JUVEN)  1 packet Oral BID BM  . pantoprazole  40 mg Oral Daily  . piperacillin-tazobactam (ZOSYN)  IV  3.375 g Intravenous 3 times per day  . sodium chloride  3 mL Intravenous Q12H  . sucralfate  1 g Oral QID  . [START ON 12/10/2014] vancomycin  1,250 mg Intravenous Q24H  . vancomycin  1,500 mg Intravenous NOW   Continuous Infusions:   Active Problems:   Quadriplegia   Hyponatremia   Seizure disorder   HTN (hypertension)   Sacral decubitus ulcer, stage IV   Suprapubic catheter   Chronic osteomyelitis, pelvic region and thigh   Sepsis   Severe protein-calorie malnutrition   Dehydration   [name] [name]  [facility] Pager [phone]. If 7PM-7AM, please contact night-coverage at [email or web address], password [email or web address] 12/09/2014, 1:16 PM  LOS: 1 day

## 2014-12-09 NOTE — Progress Notes (Signed)
PHARMACIST - PHYSICIAN COMMUNICATION Note: Policy Excludes:  Esophagectomy patients  DR:  [facility] CONCERNING: IV to Oral Route Change Policy  RECOMMENDATION: This patient is receiving pantoprazole by the intravenous route.  Based on criteria approved by the Pharmacy and Therapeutics Committee, the intravenous medication(s) is/are being converted to the equivalent oral dose form(s).   DESCRIPTION: These criteria include:  The patient is eating (either orally or via tube) and/or has been taking other orally administered medications for a least 24 hours  The patient has no evidence of active gastrointestinal bleeding or impaired GI absorption (gastrectomy, short bowel, patient on TNA or NPO).  If you have questions about this conversion, please contact the Pharmacy Department  []   [phone] )  [name] [name] []   [phone] )  [facility] []   [phone] )  [facility] []   [phone] )  [facility] [x]   [phone] )  [place], [place] 12/09/2014 12:19 PM

## 2014-12-09 NOTE — Progress Notes (Signed)
CRITICAL VALUE ALERT  Critical value received:  Blood Culture- G Negative Cocci in Clusters in both anaerobic and aerobic  Date of notification:  12/09/2014  Time of notification:  6:49 AM  Critical value read back:Yes.    Nurse who received alert:  Trinna Balloon, RN  MD notified (1st page):  Hospitalist  Time of first page:  6:50 AM
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
## 2014-12-09 NOTE — Progress Notes (Signed)
[facility]  Patient Status: Active (receiving services up to time of hospitalization)  [facility] is providing the following services: RN  If patient discharges after hours, please call [phone].   [name] [name] 12/09/2014, 9:18 AM

## 2014-12-10 DIAGNOSIS — N390 Urinary tract infection, site not specified: Secondary | ICD-10-CM

## 2014-12-10 DIAGNOSIS — B965 Pseudomonas (aeruginosa) (mallei) (pseudomallei) as the cause of diseases classified elsewhere: Secondary | ICD-10-CM

## 2014-12-10 DIAGNOSIS — D638 Anemia in other chronic diseases classified elsewhere: Secondary | ICD-10-CM

## 2014-12-10 DIAGNOSIS — R7881 Bacteremia: Secondary | ICD-10-CM

## 2014-12-10 DIAGNOSIS — L89159 Pressure ulcer of sacral region, unspecified stage: Secondary | ICD-10-CM

## 2014-12-10 DIAGNOSIS — M86659 Other chronic osteomyelitis, unspecified thigh: Secondary | ICD-10-CM

## 2014-12-10 LAB — MAGNESIUM: Magnesium: 1.7 mg/dL (ref 1.7–2.4)

## 2014-12-10 LAB — CBC
HEMATOCRIT: 24.9 % — AB (ref 39.0–52.0)
Hemoglobin: 7.5 g/dL — ABNORMAL LOW (ref 13.0–17.0)
MCH: 24.1 pg — ABNORMAL LOW (ref 26.0–34.0)
MCHC: 30.1 g/dL (ref 30.0–36.0)
MCV: 80.1 fL (ref 78.0–100.0)
Platelets: 300 10*3/uL (ref 150–400)
RBC: 3.11 MIL/uL — ABNORMAL LOW (ref 4.22–5.81)
RDW: 17.2 % — ABNORMAL HIGH (ref 11.5–15.5)
WBC: 6.5 10*3/uL (ref 4.0–10.5)

## 2014-12-10 LAB — BASIC METABOLIC PANEL
ANION GAP: 8 (ref 5–15)
BUN: 23 mg/dL — ABNORMAL HIGH (ref 6–20)
CO2: 24 mmol/L (ref 22–32)
CREATININE: 0.89 mg/dL (ref 0.61–1.24)
Calcium: 8.5 mg/dL — ABNORMAL LOW (ref 8.9–10.3)
Chloride: 110 mmol/L (ref 101–111)
GFR calc Af Amer: >60 mL/min (ref >60)
Glucose, Bld: 113 mg/dL — ABNORMAL HIGH (ref 65–99)
Potassium: 4.1 mmol/L (ref 3.5–5.1)
Sodium: 142 mmol/L (ref 135–145)

## 2014-12-10 LAB — PROCALCITONIN: Procalcitonin: <0.1 ng/mL

## 2014-12-10 NOTE — Care Management Important Message (Signed)
Important Message  Patient Details IM Letter given to Cookie/ RN Case Manager to present to patientImportant Message  Patient Details  Name: BRYTON ROMAGNOLI MRN: [id number] Date of Birth: Oct 27, 1965   Medicare Important Message Given:  Yes-second notification given    [name] 12/10/2014, 12:42 PM Name: ANANTH FIALLOS MRN: [id number] Date of Birth: Jul 31, 1965   Medicare Important Message Given:  Yes-second notification given    [name] 12/10/2014, 12:41 PM
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
## 2014-12-10 NOTE — Progress Notes (Addendum)
[facility] PROGRESS NOTE  KAISYN MILLEA SWF:[id number] DOB: 1966/03/13 DOA: 12/07/2014 PCP: [name], MD  Infectious disease: Dr. [name]  Interim summary [phone] with hx of quadriplegia, chronic suprapubic catheter & LLQ colostomy, stage IV sacral decubitus ulcer/osteomyelitis, HTN, OSA on CPAP, was on IV meropenem and vancomycin via PICC line-both were discontinued a week ago after planned surgery at [facility] was canceled, now presented with fevers after prolonged exposure to 90+ degree temps. The patient was admitted with concerns of heat exhaustion. The patient's course was complicated by bouts of fluid responsive hypotension. Pt later found to have 2/2 gm pos organisms in blood and has been continued on vanc and zosyn. Awaiting speciation of organisms. ID consulted on 7/27  Assessment/Plan: Suspected heat exhaustion vs heat stroke - History of prolonged exposure to 90+ degree temps indoors with broken air conditioner - Resolved  GM pos bacteremia - Gm pos organisms in clusters noted on 2/2 cultures - On empiric vanc and zosyn - Await speciation and sensitivities - ID consulted on 7/27 for assistance with management  Sacral decub ulcer, stage 4 - Stable - WOC consultation appreciated-seen on 7/25. Patient has multiple stage IV wounds on sacrum, coccyx, right ischial tuberosity, right groin, left ischial tuberosity and stage III wounds on right lateral malleolus, stage II on right lateral calf  Chronic osteomyelitis - Stable and chronic - No evidence of acute infection on CT - Followed by Dr. [name] - As per patient's report, was supposed to have extensive pelvic surgery and lower extremity amputations at [facility] which was canceled-? Felt to be too aggressive. - ID consulted on 7/27  Severe protein calorie malnutrition Stable  Dehydration - Resolved after IV fluids  Possible sepsis with shock - Empiric vancomycin and zosyn was started, will  cont for now - Periodic soft blood pressures-possibly autonomic dysregulation. Asymptomatic. Lactate has normalized.  Hypomagnesemia/hypokalemia - Replace as needed and follow  Chronic anemia/anemia of chronic disease - Stable. Follow CBCs periodically and transfuse if hemoglobin less than 7 g per DL.  Quadriplegia  History of gastritis and esophagitis - Continue sucralfate and PPI  Essential hypertension - Controlled  OSA - Continue nightly CPAP     DVT prophylaxis: Lovenox Code Status: Full Family Communication: None at bedside Disposition Plan: To be determined. Not medically stable for discharge.   Consultants:  WOC  Infectious disease  Procedures:  Chronic indwelling suprapubic catheter: Changed sometime in June 2016  LLQ colostomy  Antibiotics:  Vancomycin 7/24>>>  Zosyn 7/24>>>  HPI/Subjective: Denies any specific complaints.  Objective: Filed Vitals:   12/09/14 1227 12/09/14 1339 12/09/14 2315 12/10/14 0631  BP: 97/59 105/50 113/60 140/80  Pulse: 80 90 89 82  Temp:  98.2 F (36.8 C) 99.2 F (37.3 C) 99.2 F (37.3 C)  TempSrc:  Oral Oral Oral  Resp:  20 20 20   Height:      Weight:      SpO2:  99% 97% 99%    Intake/Output Summary (Last 24 hours) at 12/10/14 1434 Last data filed at 12/10/14 1307  Gross per 24 hour  Intake   1210 ml  Output   4550 ml  Net  -3340 ml   Filed Weights   12/08/14 0321  Weight: 93 kg (205 lb 0.4 oz)    Exam:   General:  Pleasant young male, overweight, lying comfortably propped up in bed.  Cardiovascular: S1 and S2 heard, RRR. No JVD, murmurs or pedal edema.  Respiratory: Clear to auscultation. No  increased work of breathing.  Abdomen: soft, obese, nondistended. Suprapubic catheter intact. LLQ colostomy draining liquid green stools.  CNS: Alert and oriented. No cranial nerve deficits  Extremities: Lower extremity grade 0 x 5 power. Upper extremities grade 2-3 x 5 power. Contractures of  hands.  Data Reviewed: Basic Metabolic Panel:  Recent Labs Lab 12/07/14 2248 12/08/14 0515 12/09/14 0500 12/10/14 0431  NA 130* 135 138 142  K 3.5 3.4* 3.5 4.1  CL 99* 104 106 110  CO2 22 23 24 24   GLUCOSE 94 94 89 113*  BUN 32* 28* 19 23*  CREATININE 1.00 1.01 0.95 0.89  CALCIUM 8.4* 8.1* 8.1* 8.5*  MG  --  1.3*  --   --   PHOS  --  4.0  --   --    Liver Function Tests:  Recent Labs Lab 12/07/14 2248 12/08/14 0515  AST 15 13*  ALT 13* 11*  ALKPHOS 80 65  BILITOT 0.6 0.5  PROT 7.9 6.5  ALBUMIN 3.3* 2.7*   No results for input(s): LIPASE, AMYLASE in the last 168 hours. No results for input(s): AMMONIA in the last 168 hours. CBC:  Recent Labs Lab 12/04/14 1247 12/07/14 2248 12/08/14 0515 12/09/14 0500 12/10/14 0431  WBC 7.8 11.0* 9.2 6.2 6.5  NEUTROABS 5.1 7.3  --   --   --   HGB 10.0* 9.1* 7.4* 7.7* 7.5*  HCT 32.1* 28.9* 24.5* 24.8* 24.9*  MCV 78.5* 76.7* 76.8* 79.0 80.1  PLT 376 346 309 302 300   Cardiac Enzymes: No results for input(s): CKTOTAL, CKMB, CKMBINDEX, TROPONINI in the last 168 hours. BNP (last 3 results) No results for input(s): BNP in the last 8760 hours.  ProBNP (last 3 results) No results for input(s): PROBNP in the last 8760 hours.  CBG: No results for input(s): GLUCAP in the last 168 hours.  Recent Results (from the past 240 hour(s))  Blood Culture (routine x 2)     Status: None (Preliminary result)   Collection Time: 12/07/14 10:48 PM  Result Value Ref Range Status   Specimen Description BLOOD RIGHT HAND  Final   Special Requests BOTTLES DRAWN AEROBIC ONLY 3CC  Final   Culture  Setup Time   Final    GRAM POSITIVE COCCI IN CLUSTERS AEROBIC BOTTLE ONLY CRITICAL RESULT CALLED TO, READ BACK BY AND VERIFIED WITH: Donne Anon RN [phone] [place]    Culture   Final    GRAM POSITIVE COCCI Performed at [facility]    Report Status PENDING  Incomplete  Blood Culture (routine x 2)     Status:  None (Preliminary result)   Collection Time: 12/07/14 11:34 PM  Result Value Ref Range Status   Specimen Description BLOOD RIGHT ARM  Final   Special Requests BOTTLES DRAWN AEROBIC ONLY 3CC  Final   Culture  Setup Time   Final    GRAM POSITIVE COCCI IN CLUSTERS AEROBIC BOTTLE ONLY CRITICAL RESULT CALLED TO, READ BACK BY AND VERIFIED WITH: Donne Anon RN [phone] GREEN R CONFIRMED BY T. [place]     Culture   Final    GRAM POSITIVE COCCI Performed at [facility]    Report Status PENDING  Incomplete  Urine culture     Status: None (Preliminary result)   Collection Time: 12/07/14 11:39 PM  Result Value Ref Range Status   Specimen Description URINE, CATHETERIZED  Final   Special Requests NONE  Final   Culture   Final    >=  100,000 COLONIES/mL PSEUDOMONAS AERUGINOSA 60,000 COLONIES/ml GRAM NEGATIVE RODS Performed at [facility]    Report Status PENDING  Incomplete     Studies: No results found.  Scheduled Meds: . darifenacin  7.5 mg Oral Daily  . enoxaparin (LOVENOX) injection  40 mg Subcutaneous Q24H  . lactose free nutrition  237 mL Oral TID BM  . metoCLOPramide (REGLAN) injection  5 mg Intravenous 4 times per day  . multivitamin with minerals  1 tablet Oral Daily  . nutrition supplement (JUVEN)  1 packet Oral BID BM  . pantoprazole  40 mg Oral Daily  . piperacillin-tazobactam (ZOSYN)  IV  3.375 g Intravenous 3 times per day  . sodium chloride  3 mL Intravenous Q12H  . sucralfate  1 g Oral QID  . vancomycin  1,250 mg Intravenous Q24H   Continuous Infusions:   Active Problems:   Quadriplegia   Hyponatremia   Seizure disorder   HTN (hypertension)   Sacral decubitus ulcer, stage IV   Suprapubic catheter   Chronic osteomyelitis, pelvic region and thigh   Sepsis   Severe protein-calorie malnutrition   Dehydration    Alexina Niccoli, MD, FACP, FHM. [facility] Pager [phone]  If 7PM-7AM, please contact  night-coverage [email or web address] Password [facility] 12/10/2014, 2:34 PM   LOS: 2 days

## 2014-12-11 ENCOUNTER — Inpatient Hospital Stay (HOSPITAL_COMMUNITY): Payer: Medicare Other

## 2014-12-11 DIAGNOSIS — R509 Fever, unspecified: Secondary | ICD-10-CM

## 2014-12-11 DIAGNOSIS — B957 Other staphylococcus as the cause of diseases classified elsewhere: Secondary | ICD-10-CM

## 2014-12-11 DIAGNOSIS — B9689 Other specified bacterial agents as the cause of diseases classified elsewhere: Secondary | ICD-10-CM

## 2014-12-11 DIAGNOSIS — L8990 Pressure ulcer of unspecified site, unspecified stage: Secondary | ICD-10-CM

## 2014-12-11 DIAGNOSIS — R7881 Bacteremia: Secondary | ICD-10-CM | POA: Insufficient documentation

## 2014-12-11 LAB — CBC
HEMATOCRIT: 26.9 % — AB (ref 39.0–52.0)
Hemoglobin: 7.9 g/dL — ABNORMAL LOW (ref 13.0–17.0)
MCH: 23.2 pg — ABNORMAL LOW (ref 26.0–34.0)
MCHC: 29.4 g/dL — ABNORMAL LOW (ref 30.0–36.0)
MCV: 79.1 fL (ref 78.0–100.0)
Platelets: 360 10*3/uL (ref 150–400)
RBC: 3.4 MIL/uL — ABNORMAL LOW (ref 4.22–5.81)
RDW: 17 % — ABNORMAL HIGH (ref 11.5–15.5)
WBC: 9.1 10*3/uL (ref 4.0–10.5)

## 2014-12-11 LAB — CULTURE, BLOOD (ROUTINE X 2)

## 2014-12-11 LAB — URINE CULTURE: Culture: >=100000

## 2014-12-11 MED ORDER — PRO-STAT SUGAR FREE PO LIQD
30.0000 mL | Freq: Two times a day (BID) | ORAL | Status: DC
  Administered 2014-12-11 – 2014-12-15 (×8): 30 mL via ORAL
  Filled 2014-12-11 (×16): qty 30

## 2014-12-11 NOTE — Progress Notes (Signed)
Echocardiogram 2D Echocardiogram has been performed.  12/11/2014 2:47 PM Maudry Mayhew, RVT, RDCS, RDMS

## 2014-12-11 NOTE — Progress Notes (Addendum)
[facility] PROGRESS NOTE  [name] [name] DZH:[id number] DOB: 05/31/1965 DOA: 12/07/2014 PCP: [name], MD  Infectious disease: Dr. [name]  Interim summary [phone] with hx of quadriplegia, chronic suprapubic catheter & LLQ colostomy, stage IV sacral decubitus ulcer/osteomyelitis, HTN, OSA on CPAP, was on IV meropenem and vancomycin via PICC line-both were discontinued a week ago after planned surgery at [facility] was canceled, now presented with fevers after prolonged exposure to 90+ degree temps. The patient was admitted with concerns of heat exhaustion. The patient's course was complicated by bouts of fluid responsive hypotension. Pt later found to have 2/2 gm pos organisms in blood and has been continued on vanc and zosyn. Awaiting speciation of organisms. ID consulted on 7/27  Assessment/Plan: Suspected heat exhaustion vs heat stroke - History of prolonged exposure to 90+ degree temps indoors with broken air conditioner - Resolved  MRSE bacteremia - On empiric vanc - ID consultation and follow-up appreciated - As per ID, if TTE negative, would give 2 weeks of vancomycin - Surveillance blood cultures 7/27: Negative to date.  Sacral decub ulcer, stage 4 - Stable - WOC consultation appreciated-seen on 7/25. Patient has multiple stage IV wounds on sacrum, coccyx, right ischial tuberosity, right groin, left ischial tuberosity and stage III wounds on right lateral malleolus, stage II on right lateral calf - Consulted Dr. [name], plastic surgeon on 12/11/14  Chronic osteomyelitis - Stable and chronic - No evidence of acute infection on CT - Followed by Dr. [name] - As per patient's report, was supposed to have extensive pelvic surgery and lower extremity amputations at [facility] which was canceled-? Felt to be too aggressive. - ID input appreciated. Consulted Dr. [name] on 7/28-await recommendations.  Severe protein calorie  malnutrition Stable  Dehydration - Resolved after IV fluids  Possible sepsis with shock - Empiric vancomycin and zosyn was started, will cont for now - shock resolved.  Hypomagnesemia/hypokalemia - Replace as needed and follow  Chronic anemia/anemia of chronic disease - Stable. Follow CBCs periodically and transfuse if hemoglobin less than 7 g per DL.  Quadriplegia  History of gastritis and esophagitis - Continue sucralfate and PPI  Essential hypertension - Controlled  OSA - Continue nightly CPAP   UTI  - >100 K Pseudomonas Synthroid and Providencia - Zosyn for 1 week  Constipation  -  KUB without acute findings.      DVT prophylaxis: Lovenox Code Status: Full Family Communication: None at bedside Disposition Plan: To be determined. Not medically stable for discharge.   Consultants:  WOC  Infectious disease  Procedures:  Chronic indwelling suprapubic catheter: Changed sometime in June 2016  LLQ colostomy  Antibiotics:  Vancomycin 7/24>>>  Zosyn 7/24>>>  HPI/Subjective: Denies any specific complaints.  Objective: Filed Vitals:   12/10/14 1449 12/10/14 2128 12/11/14 0552 12/11/14 1530  BP: 132/64 154/77 142/72 137/77  Pulse: 87 74 63 60  Temp: 98.9 F (37.2 C) 98.8 F (37.1 C) 97.6 F (36.4 C) 98.1 F (36.7 C)  TempSrc: Oral Oral Oral Oral  Resp: 17 18 16 18   Height:      Weight:      SpO2: 98% 97% 100% 100%    Intake/Output Summary (Last 24 hours) at 12/11/14 1725 Last data filed at 12/11/14 1700  Gross per 24 hour  Intake   1170 ml  Output   5575 ml  Net  -4405 ml   Filed Weights   12/08/14 0321  Weight: 93 kg (205 lb 0.4 oz)  Exam:   General:  Pleasant young male, overweight, lying comfortably propped up in bed.  Cardiovascular: S1 and S2 heard, RRR. No JVD, murmurs or pedal edema. Telemetry: Sinus rhythm.  Respiratory: Clear to auscultation. No increased work of breathing.  Abdomen: soft, obese, nondistended.  Suprapubic catheter intact. LLQ colostomy draining liquid green stools.  CNS: Alert and oriented. No cranial nerve deficits  Extremities: Lower extremity grade 0 x 5 power. Upper extremities grade 2-3 x 5 power. Contractures of hands.  Data Reviewed: Basic Metabolic Panel:  Recent Labs Lab 12/07/14 2248 12/08/14 0515 12/09/14 0500 12/10/14 0430 12/10/14 0431  NA 130* 135 138  --  142  K 3.5 3.4* 3.5  --  4.1  CL 99* 104 106  --  110  CO2 22 23 24   --  24  GLUCOSE 94 94 89  --  113*  BUN 32* 28* 19  --  23*  CREATININE 1.00 1.01 0.95  --  0.89  CALCIUM 8.4* 8.1* 8.1*  --  8.5*  MG  --  1.3*  --  1.7  --   PHOS  --  4.0  --   --   --    Liver Function Tests:  Recent Labs Lab 12/07/14 2248 12/08/14 0515  AST 15 13*  ALT 13* 11*  ALKPHOS 80 65  BILITOT 0.6 0.5  PROT 7.9 6.5  ALBUMIN 3.3* 2.7*   No results for input(s): LIPASE, AMYLASE in the last 168 hours. No results for input(s): AMMONIA in the last 168 hours. CBC:  Recent Labs Lab 12/07/14 2248 12/08/14 0515 12/09/14 0500 12/10/14 0431 12/11/14 0600  WBC 11.0* 9.2 6.2 6.5 9.1  NEUTROABS 7.3  --   --   --   --   HGB 9.1* 7.4* 7.7* 7.5* 7.9*  HCT 28.9* 24.5* 24.8* 24.9* 26.9*  MCV 76.7* 76.8* 79.0 80.1 79.1  PLT 346 309 302 300 360   Cardiac Enzymes: No results for input(s): CKTOTAL, CKMB, CKMBINDEX, TROPONINI in the last 168 hours. BNP (last 3 results) No results for input(s): BNP in the last 8760 hours.  ProBNP (last 3 results) No results for input(s): PROBNP in the last 8760 hours.  CBG: No results for input(s): GLUCAP in the last 168 hours.  Recent Results (from the past 240 hour(s))  Blood Culture (routine x 2)     Status: None   Collection Time: 12/07/14 10:48 PM  Result Value Ref Range Status   Specimen Description BLOOD RIGHT HAND  Final   Special Requests BOTTLES DRAWN AEROBIC ONLY 3CC  Final   Culture  Setup Time   Final    GRAM POSITIVE COCCI IN CLUSTERS AEROBIC BOTTLE  ONLY CRITICAL RESULT CALLED TO, READ BACK BY AND VERIFIED WITH: Donne Anon RN [phone] [place]    Culture   Final    STAPHYLOCOCCUS SPECIES (COAGULASE NEGATIVE) Performed at [facility]    Report Status 12/11/2014 FINAL  Final   Organism ID, Bacteria STAPHYLOCOCCUS SPECIES (COAGULASE NEGATIVE)  Final      Susceptibility   Staphylococcus species (coagulase negative) - MIC*    CIPROFLOXACIN >=8 RESISTANT Resistant     ERYTHROMYCIN >=8 RESISTANT Resistant     GENTAMICIN <=0.5 SENSITIVE Sensitive     OXACILLIN >=4 RESISTANT Resistant     TETRACYCLINE <=1 SENSITIVE Sensitive     VANCOMYCIN 1 SENSITIVE Sensitive     TRIMETH/SULFA <=10 SENSITIVE Sensitive     CLINDAMYCIN <=0.25 INTERMEDIATE Intermediate  RIFAMPIN <=0.5 SENSITIVE Sensitive     Inducible Clindamycin NEGATIVE Sensitive     * STAPHYLOCOCCUS SPECIES (COAGULASE NEGATIVE)  Blood Culture (routine x 2)     Status: None   Collection Time: 12/07/14 11:34 PM  Result Value Ref Range Status   Specimen Description BLOOD RIGHT ARM  Final   Special Requests BOTTLES DRAWN AEROBIC ONLY 3CC  Final   Culture  Setup Time   Final    GRAM POSITIVE COCCI IN CLUSTERS AEROBIC BOTTLE ONLY CRITICAL RESULT CALLED TO, READ BACK BY AND VERIFIED WITH: Donne Anon RN [phone] GREEN R CONFIRMED BY T. [place]     Culture   Final    STAPHYLOCOCCUS SPECIES (COAGULASE NEGATIVE) SUSCEPTIBILITIES PERFORMED ON PREVIOUS CULTURE WITHIN THE LAST 5 DAYS. Performed at [facility]    Report Status 12/11/2014 FINAL  Final  Urine culture     Status: None   Collection Time: 12/07/14 11:39 PM  Result Value Ref Range Status   Specimen Description URINE, CATHETERIZED  Final   Special Requests NONE  Final   Culture   Final    >=100,000 COLONIES/mL PSEUDOMONAS AERUGINOSA 60,000 COLONIES/ml PROVIDENCIA STUARTII Performed at [facility]    Report Status 12/11/2014 FINAL  Final   Organism ID, Bacteria  PSEUDOMONAS AERUGINOSA  Final   Organism ID, Bacteria PROVIDENCIA STUARTII  Final      Susceptibility   Pseudomonas aeruginosa - MIC*    CEFTAZIDIME 4 SENSITIVE Sensitive     CIPROFLOXACIN >=4 RESISTANT Resistant     GENTAMICIN <=1 SENSITIVE Sensitive     IMIPENEM >=16 RESISTANT Resistant     PIP/TAZO 16 SENSITIVE Sensitive     CEFEPIME 8 SENSITIVE Sensitive     * >=100,000 COLONIES/mL PSEUDOMONAS AERUGINOSA   Providencia stuartii - MIC*    AMPICILLIN >=32 RESISTANT Resistant     CEFAZOLIN >=64 RESISTANT Resistant     CEFTRIAXONE <=1 SENSITIVE Sensitive     CIPROFLOXACIN >=4 RESISTANT Resistant     GENTAMICIN 8 RESISTANT Resistant     IMIPENEM 1 SENSITIVE Sensitive     NITROFURANTOIN 128 RESISTANT Resistant     TRIMETH/SULFA 160 RESISTANT Resistant     AMPICILLIN/SULBACTAM >=32 RESISTANT Resistant     PIP/TAZO <=4 SENSITIVE Sensitive     * 60,000 COLONIES/ml PROVIDENCIA STUARTII  Culture, blood (routine x 2)     Status: None (Preliminary result)   Collection Time: 12/10/14  7:30 PM  Result Value Ref Range Status   Specimen Description BLOOD RIGHT HAND  Final   Special Requests BOTTLES DRAWN AEROBIC ONLY 5ML  Final   Culture   Final    NO GROWTH < 24 HOURS Performed at [facility]    Report Status PENDING  Incomplete     Studies: Dg Abd Portable 1v  12/11/2014   CLINICAL DATA:  Constipation, quadriplegia.  EXAM: PORTABLE ABDOMEN - 1 VIEW  COMPARISON:  Comparison is an acute abdomen series dated 10/08/2014.  FINDINGS: Overall bowel gas pattern is nonobstructive with no dilated small or large bowel loops identified. There is moderate gaseous distention of the stomach but this is less distention than what was seen on the previous exam. There is no evidence of soft tissue mass or abnormal fluid collection within the abdomen or pelvis. No evidence of free intraperitoneal air.  Again noted are the severe degenerative/dystrophic changes at each hip joint. No acute osseous  abnormality identified.  IMPRESSION: No evidence of acute intra-abdominal or intrapelvic abnormality identified. No evidence of small  or large bowel obstruction. There is moderate gaseous distention of the stomach but this is improved compared to the previous exam.   Electronically Signed   By: Franki Cabot M.D.   On: 12/11/2014 15:30    Scheduled Meds: . darifenacin  7.5 mg Oral Daily  . enoxaparin (LOVENOX) injection  40 mg Subcutaneous Q24H  . feeding supplement (PRO-STAT SUGAR FREE 64)  30 mL Oral BID  . lactose free nutrition  237 mL Oral TID BM  . metoCLOPramide (REGLAN) injection  5 mg Intravenous 4 times per day  . multivitamin with minerals  1 tablet Oral Daily  . nutrition supplement (JUVEN)  1 packet Oral BID BM  . pantoprazole  40 mg Oral Daily  . piperacillin-tazobactam (ZOSYN)  IV  3.375 g Intravenous 3 times per day  . sodium chloride  3 mL Intravenous Q12H  . sucralfate  1 g Oral QID  . vancomycin  1,250 mg Intravenous Q24H   Continuous Infusions:   Active Problems:   Quadriplegia   Hyponatremia   Seizure disorder   HTN (hypertension)   Sacral decubitus ulcer, stage IV   Suprapubic catheter   Chronic osteomyelitis, pelvic region and thigh   Sepsis   Severe protein-calorie malnutrition   Dehydration  Time: 20 minutes.   Vernell Leep, MD, FACP, FHM. [facility] Pager [phone]  If 7PM-7AM, please contact night-coverage [email or web address] Password [email or web address] 12/11/2014, 5:25 PM   LOS: 3 days

## 2014-12-11 NOTE — Progress Notes (Signed)
Nutrition Follow-up  DOCUMENTATION CODES:   Not applicable  INTERVENTION:  - Continue Boost Plus TID and Juven BID - Will order Prostat BID, each supplement provides 100 kcal and 15 grams of protein - RD will continue to monitor for needs  NUTRITION DIAGNOSIS:   Increased nutrient needs related to wound healing as evidenced by estimated needs. -ongoing  GOAL:   Patient will meet greater than or equal to 90% of their needs -likely met on average  MONITOR:   PO intake, Supplement acceptance, Weight trends, Labs  ASSESSMENT:  49 y.o. male has a past medical history of History of UTI; Decubitus ulcer, stage IV; HTN (hypertension); Quadriplegia; Acute respiratory failure; History of sepsis; History of gastritis; History of gastric ulcer; History of esophagitis; History of small bowel obstruction (June 2009); Osteomyelitis of vertebra of sacral and sacrococcygeal region; Morbid obesity; Coagulase-negative staphylococcal infection; Chronic respiratory failure; Normocytic anemia; GERD (gastroesophageal reflux disease); Depression; HCAP (healthcare-associated pneumonia) (?2006); Obstructive sleep apnea on CPAP; Seizures (1999 x 1); and Right groin ulcer.   Presented with nausea for one day. he has no a condition in his home for past 2 days with a temperature as going up to 90s inside the house. Patient felt overheated in emergency department was noted to be febrile up to 101.8 with heart rate up to 116, WBC elevated at 11. Patient was also noted to be hypotensive with blood pressure down to 92/45. Met criteria for sepsis. Now BP up 124/78 after 2 L of NS.  Pt has been eating 100% of meals since last assessment. He states he was able to eat breakfast this AM without issue. He has been consuming supplements; he was consuming Boost Plus and Juven at home. Will add Prostat to increase protein intake for wound healing.  Meeting needs on average. Medications reviewed. Labs reviewed; BUN elevated,  Ca: 8.5 mg/dL.  Diet Order:  Diet regular Room service appropriate?: Yes; Fluid consistency:: Thin  Skin:  Stage 4 pressure ulcers to bilateral buttocks and sacrum  Last BM:  PTA  Height:   Ht Readings from Last 1 Encounters:  12/08/14 6' (1.829 m)    Weight:   Wt Readings from Last 1 Encounters:  12/08/14 205 lb 0.4 oz (93 kg)    Ideal Body Weight:  80.91 kg (kg)  BMI:  Body mass index is 27.8 kg/(m^2).  Estimated Nutritional Needs:   Kcal:  2600-2800  Protein:  150-160 grams  Fluid:  2.5 L/day  EDUCATION NEEDS:   No education needs identified at this time     [name], RD, LDN Inpatient Clinical Dietitian Pager # [phone] After hours/weekend pager # [phone]

## 2014-12-11 NOTE — Progress Notes (Signed)
INFECTIOUS DISEASE PROGRESS NOTE  ID: [name] [name] is a 49 y.o. male with  Active Problems:   Quadriplegia   Hyponatremia   Seizure disorder   HTN (hypertension)   Sacral decubitus ulcer, stage IV   Suprapubic catheter   Chronic osteomyelitis, pelvic region and thigh   Sepsis   Severe protein-calorie malnutrition   Dehydration  Subjective: Without complaints  Abtx:  Anti-infectives    Start     Dose/Rate Route Frequency Ordered Stop   12/10/14 0600  vancomycin (VANCOCIN) 1,250 mg in sodium chloride 0.9 % 250 mL IVPB     1,250 mg 166.7 mL/hr over 90 Minutes Intravenous Every 24 hours 12/09/14 1213     12/09/14 1300  vancomycin (VANCOCIN) 1,500 mg in sodium chloride 0.9 % 500 mL IVPB     1,500 mg 250 mL/hr over 120 Minutes Intravenous NOW 12/09/14 1213 12/09/14 1450   12/08/14 0600  piperacillin-tazobactam (ZOSYN) IVPB 3.375 g  Status:  Discontinued     3.375 g 100 mL/hr over 30 Minutes Intravenous 3 times per day 12/08/14 0320 12/08/14 0337   12/08/14 0600  piperacillin-tazobactam (ZOSYN) IVPB 3.375 g     3.375 g 12.5 mL/hr over 240 Minutes Intravenous 3 times per day 12/08/14 0338     12/07/14 2330  vancomycin (VANCOCIN) IVPB 1000 mg/200 mL premix     1,000 mg 200 mL/hr over 60 Minutes Intravenous  Once 12/07/14 2325 12/08/14 0214   12/07/14 2330  piperacillin-tazobactam (ZOSYN) IVPB 3.375 g     3.375 g 100 mL/hr over 30 Minutes Intravenous  Once 12/07/14 2325 12/08/14 0114      Medications:  Scheduled: . darifenacin  7.5 mg Oral Daily  . enoxaparin (LOVENOX) injection  40 mg Subcutaneous Q24H  . feeding supplement (PRO-STAT SUGAR FREE 64)  30 mL Oral BID  . lactose free nutrition  237 mL Oral TID BM  . metoCLOPramide (REGLAN) injection  5 mg Intravenous 4 times per day  . multivitamin with minerals  1 tablet Oral Daily  . nutrition supplement (JUVEN)  1 packet Oral BID BM  . pantoprazole  40 mg Oral Daily  . piperacillin-tazobactam (ZOSYN)  IV  3.375 g  Intravenous 3 times per day  . sodium chloride  3 mL Intravenous Q12H  . sucralfate  1 g Oral QID  . vancomycin  1,250 mg Intravenous Q24H    Objective: Vital signs in last 24 hours: Temp:  [97.6 F (36.4 C)-98.8 F (37.1 C)] 97.6 F (36.4 C) (07/28 0552) Pulse Rate:  [63-74] 63 (07/28 0552) Resp:  [16-18] 16 (07/28 0552) BP: (142-154)/(72-77) 142/72 mmHg (07/28 0552) SpO2:  [97 %-100 %] 100 % (07/28 0552)   General appearance: alert, cooperative and no distress Resp: clear to auscultation bilaterally Cardio: regular rate and rhythm GI: normal findings: bowel sounds normal, soft, non-tender and distension unchanged  Lab Results  Recent Labs  12/09/14 0500 12/10/14 0431 12/11/14 0600  WBC 6.2 6.5 9.1  HGB 7.7* 7.5* 7.9*  HCT 24.8* 24.9* 26.9*  NA 138 142  --   K 3.5 4.1  --   CL 106 110  --   CO2 24 24  --   BUN 19 23*  --   CREATININE 0.95 0.89  --    Liver Panel No results for input(s): PROT, ALBUMIN, AST, ALT, ALKPHOS, BILITOT, BILIDIR, IBILI in the last 72 hours. Sedimentation Rate No results for input(s): ESRSEDRATE in the last 72 hours. C-Reactive Protein No results for input(s): CRP  in the last 72 hours.  Microbiology: Recent Results (from the past 240 hour(s))  Blood Culture (routine x 2)     Status: None   Collection Time: 12/07/14 10:48 PM  Result Value Ref Range Status   Specimen Description BLOOD RIGHT HAND  Final   Special Requests BOTTLES DRAWN AEROBIC ONLY 3CC  Final   Culture  Setup Time   Final    GRAM POSITIVE COCCI IN CLUSTERS AEROBIC BOTTLE ONLY CRITICAL RESULT CALLED TO, READ BACK BY AND VERIFIED WITH: Donne Anon RN [phone] GREENR CONFIRMED BY T. [place]    Culture   Final    STAPHYLOCOCCUS SPECIES (COAGULASE NEGATIVE) Performed at [facility]    Report Status 12/11/2014 FINAL  Final   Organism ID, Bacteria STAPHYLOCOCCUS SPECIES (COAGULASE NEGATIVE)  Final      Susceptibility   Staphylococcus species (coagulase  negative) - MIC*    CIPROFLOXACIN >=8 RESISTANT Resistant     ERYTHROMYCIN >=8 RESISTANT Resistant     GENTAMICIN <=0.5 SENSITIVE Sensitive     OXACILLIN >=4 RESISTANT Resistant     TETRACYCLINE <=1 SENSITIVE Sensitive     VANCOMYCIN 1 SENSITIVE Sensitive     TRIMETH/SULFA <=10 SENSITIVE Sensitive     CLINDAMYCIN <=0.25 INTERMEDIATE Intermediate     RIFAMPIN <=0.5 SENSITIVE Sensitive     Inducible Clindamycin NEGATIVE Sensitive     * STAPHYLOCOCCUS SPECIES (COAGULASE NEGATIVE)  Blood Culture (routine x 2)     Status: None   Collection Time: 12/07/14 11:34 PM  Result Value Ref Range Status   Specimen Description BLOOD RIGHT ARM  Final   Special Requests BOTTLES DRAWN AEROBIC ONLY 3CC  Final   Culture  Setup Time   Final    GRAM POSITIVE COCCI IN CLUSTERS AEROBIC BOTTLE ONLY CRITICAL RESULT CALLED TO, READ BACK BY AND VERIFIED WITH: Donne Anon RN [phone] GREEN R CONFIRMED BY T. [place]     Culture   Final    STAPHYLOCOCCUS SPECIES (COAGULASE NEGATIVE) SUSCEPTIBILITIES PERFORMED ON PREVIOUS CULTURE WITHIN THE LAST 5 DAYS. Performed at [facility]    Report Status 12/11/2014 FINAL  Final  Urine culture     Status: None   Collection Time: 12/07/14 11:39 PM  Result Value Ref Range Status   Specimen Description URINE, CATHETERIZED  Final   Special Requests NONE  Final   Culture   Final    >=100,000 COLONIES/mL PSEUDOMONAS AERUGINOSA 60,000 COLONIES/ml PROVIDENCIA STUARTII Performed at [facility]    Report Status 12/11/2014 FINAL  Final   Organism ID, Bacteria PSEUDOMONAS AERUGINOSA  Final   Organism ID, Bacteria PROVIDENCIA STUARTII  Final      Susceptibility   Pseudomonas aeruginosa - MIC*    CEFTAZIDIME 4 SENSITIVE Sensitive     CIPROFLOXACIN >=4 RESISTANT Resistant     GENTAMICIN <=1 SENSITIVE Sensitive     IMIPENEM >=16 RESISTANT Resistant     PIP/TAZO 16 SENSITIVE Sensitive     CEFEPIME 8 SENSITIVE Sensitive     * >=100,000 COLONIES/mL  PSEUDOMONAS AERUGINOSA   Providencia stuartii - MIC*    AMPICILLIN >=32 RESISTANT Resistant     CEFAZOLIN >=64 RESISTANT Resistant     CEFTRIAXONE <=1 SENSITIVE Sensitive     CIPROFLOXACIN >=4 RESISTANT Resistant     GENTAMICIN 8 RESISTANT Resistant     IMIPENEM 1 SENSITIVE Sensitive     NITROFURANTOIN 128 RESISTANT Resistant     TRIMETH/SULFA 160 RESISTANT Resistant     AMPICILLIN/SULBACTAM >=32 RESISTANT Resistant  PIP/TAZO <=4 SENSITIVE Sensitive     * 60,000 COLONIES/ml PROVIDENCIA STUARTII  Culture, blood (routine x 2)     Status: None (Preliminary result)   Collection Time: 12/10/14  7:30 PM  Result Value Ref Range Status   Specimen Description BLOOD RIGHT HAND  Final   Special Requests BOTTLES DRAWN AEROBIC ONLY 5ML  Final   Culture   Final    NO GROWTH < 24 HOURS Performed at [facility]    Report Status PENDING  Incomplete    Studies/Results: No results found.   Assessment/Plan: Sepsis Continue his vanco/zosyn  Bacteremia- MRSE Repeat BCx so far ngtd Check TTE (pending)  Provided TTE is negative, would give 2 weeks vanco  UTI > 100k pseudomonas and providencia both sensitive to Zosyn  Would give zosyn for 1 week.  No clear if this is colonization vs active infection.  Decubitus Ulcer Appreciate WOC f/u Consider MRI of pelvis, ask Dr [name] to eval and comment on this.   Drug monitoring Will f/u Cr (normal) F/u vanco trough  Total days of antibiotics: 4 vanco/zosyn (7-24)  Available as needed Please have him f/u in ID clinic with me in 2-3 weeks         [name] Infectious Diseases (pager) [phone] [email or web address] 12/11/2014, 3:10 PM  LOS: 3 days

## 2014-12-12 ENCOUNTER — Encounter (HOSPITAL_COMMUNITY): Payer: Self-pay | Admitting: Plastic Surgery

## 2014-12-12 LAB — PROCALCITONIN: Procalcitonin: 0.1 ng/mL

## 2014-12-12 NOTE — Progress Notes (Signed)
[facility] bed are no longer available for in the home use/[facility] [phone] related to third party. Several calls made to other companies, bed is not available for home use.
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
## 2014-12-12 NOTE — Progress Notes (Signed)
Pt placed on CPAP via FFM, current settings are 19 CMH20 per Pt home setting.  Pt tolerating well at this time, RT to monitor and assess as needed.

## 2014-12-12 NOTE — Progress Notes (Signed)
Taking on care. I agree with the previous nurses assessment and will continue to monitor.

## 2014-12-12 NOTE — Progress Notes (Signed)
Pt wants CPAP at around 2330.  RT to monitor and assess as needed.

## 2014-12-13 ENCOUNTER — Inpatient Hospital Stay (HOSPITAL_COMMUNITY): Payer: Medicare Other

## 2014-12-13 LAB — VANCOMYCIN, TROUGH: VANCOMYCIN TR: 14 ug/mL (ref 10.0–20.0)

## 2014-12-13 MED ORDER — LIDOCAINE HCL 1 % IJ SOLN
INTRAMUSCULAR | Status: AC
  Filled 2014-12-13: qty 20

## 2014-12-13 NOTE — Procedures (Signed)
Interventional Radiology Procedure Note  Procedure: Placement of a left brachial vein approach double lumen PICC.  51cm.  Power injectable.   Tip is positioned at the superior cavoatrial junction and catheter is ready for immediate use.  Complications: None Recommendations:  - Ok to shower tomorrow - Do not submerge   - Routine line care   Signed,  Dulcy Fanny. Earleen Newport, DO
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
## 2014-12-14 DIAGNOSIS — L89154 Pressure ulcer of sacral region, stage 4: Secondary | ICD-10-CM

## 2014-12-14 MED ORDER — POTASSIUM CHLORIDE CRYS ER 20 MEQ PO TBCR: 40.0000 meq | EXTENDED_RELEASE_TABLET | Freq: Every day | ORAL | Status: DC | PRN

## 2014-12-14 MED ORDER — VANCOMYCIN HCL 10 G IV SOLR
1250.0000 mg | INTRAVENOUS | Status: DC
  Administered 2014-12-15: 1250 mg via INTRAVENOUS
  Filled 2014-12-14: qty 1250

## 2014-12-14 MED ORDER — SODIUM CHLORIDE 0.9 % IJ SOLN
10.0000 mL | INTRAMUSCULAR | Status: DC | PRN
  Administered 2014-12-15 (×2): 20 mL
  Filled 2014-12-14 (×2): qty 40

## 2014-12-14 MED ORDER — VANCOMYCIN HCL 10 G IV SOLR: 1250.0000 mg | INTRAVENOUS | Status: DC

## 2014-12-14 NOTE — Progress Notes (Signed)
Pt reports that his specialized bed for home has been delivered, just needs to be set up. He will call his caregiver in the morning to verify set up of bed so he can be discharge home. Will continue to monitor.

## 2014-12-14 NOTE — Discharge Summary (Addendum)
Physician Discharge Summary  [name] [name] MPN:[id number] DOB: [birth year] DOA: 12/07/2014  PCP: [name] Greenland, MD  Admit date: 12/07/2014 Discharge date: 12/14/2014  Time spent: Greater than 30 minutes  Recommendations for Outpatient Follow-up:  1. Dr. Glendale [name], PCP in 1 week 2. Dr. Theodoro [name], Plastic Surgery: Patient advised to call for appointment. He missed an appointment while hospitalized. 3. Dr. Bobby [name], ID in 2-3 weeks. Please follow final surveillance blood culture results from 7/27 that were sent from the hospital. 4. Home health RN for dressing changes, IV antibiotics and labs (CBC, CMP and vancomycin trough on 12/17/14-forward results to Dr. Bobby [name], ID) 5. PICC line was placed for IV antibiotics on 12/13/14. Please coordinate with Dr. Johnnye [name] as to timing of removal of PICC line post completion of IV antibiotics.   Discharge Diagnoses:  Active Problems:   Quadriplegia   Hyponatremia   Seizure disorder   HTN (hypertension)   Sacral decubitus ulcer, stage IV   Suprapubic catheter   Chronic osteomyelitis, pelvic region and thigh   Sepsis   Severe protein-calorie malnutrition   Dehydration   Bacteremia   Discharge Condition: Improved & Stable  Diet recommendation: Heart healthy diet.  Filed Weights   12/08/14 0321  Weight: 93 kg (205 lb 0.4 oz)    History of present illness:  49yo with hx of quadriplegia, chronic suprapubic catheter & LLQ colostomy, stage IV sacral decubitus ulcer/osteomyelitis, HTN, OSA on CPAP, was on IV meropenem and vancomycin via PICC line-both were discontinued a week ago after planned surgery at [facility] was canceled, now presented with fevers after prolonged exposure to 90+ degree temps. The patient was admitted with concerns of heat exhaustion. The patient's course was complicated by bouts of fluid responsive hypotension. Confirmed to have coagulase-negative staph bacteremia and UTI.  Hospital Course:    Suspected heat exhaustion vs heat stroke - History of prolonged exposure to 90+ degree temps indoors with broken air conditioner - Resolved  Coagulase Negative staphylococcal (MRSE) bacteremia - On empiric vanc since 7/24 but missed on 7/25. - ID consultation and follow-up appreciated - As per ID: If TTE negative, total 2 weeks of IV vancomycin. TTE: No vegetations reported.  - Surveillance blood culture 1 on 7/27: Negative to date. Blood cultures  2 on 12/07/14: Coagulase negative staph - PICC placed by IR on 7/30. - Complete IV vancomycin on 12/22/14. Repeat vancomycin trough on 12/17/14. Defer decision regarding removing PICC line to Dr. Johnnye [name] and outpatient follow-up.  Sacral decub ulcer, stage 4 - Stable - WOC consultation appreciated-seen on 7/25. Patient has multiple stage IV wounds on sacrum, coccyx, right ischial tuberosity, right groin, left ischial tuberosity and stage III wounds on right lateral malleolus, stage II on right lateral calf - Seen patient's sacral wounds along with plastic surgeon Dr. [name] anger on 7/29: Patient seen at [facility] for possible hemipelvectomy but patient decided to wait on procedure. Ulcers have improved. - Per plastic surgery: Continue to maximize nutritional status, turn off areas regularly, protein maximization, vitamins and wet to dry dressings 2 - 3 times a day. Recommend [place] bed for home (as per case management, Cl[place]ron bed no longer available for home use).  Chronic osteomyelitis - Stable and chronic - No evidence of acute infection on CT - Followed by Dr. Migdalia [name] - As per patient's report, was supposed to have extensive pelvic surgery and lower extremity amputations at [facility] which was canceled-? Felt to be too aggressive. - see above  Severe protein  calorie malnutrition Stable. Continue nutritional supplements at home.  Dehydration - Resolved after IV fluids  Possible sepsis with shock - Empiric vancomycin and  zosyn was started, will cont for now - shock resolved.  Hypomagnesemia/hypokalemia - Replace as needed and follow  Chronic anemia/anemia of chronic disease - Stable. Follow CBCs periodically and transfuse if hemoglobin less than 7 g per DL.  Quadriplegia  History of gastritis and esophagitis - Continue sucralfate and PPI  Essential hypertension - Controlled. Amlodipine had been held in the hospital. Blood pressures intermittently starting to creep up. Resume amlodipine at discharge.  OSA - Continue nightly CPAP  UTI  - >100 K Pseudomonas Synthroid and Providencia - Patient has completed 1 week of IV Zosyn and will discontinue at discharge.  Constipation  - KUB without acute findings.   Consultants:  WOC  Infectious disease  Plastic surgery  IR  Procedures:  Chronic indwelling suprapubic catheter: Changed sometime in June 2016  LLQ colostomy  Fluoroscopy-guided PICC line 7/30   Discharge Exam:  Complaints:  Patient denies any complaints. States that his new home is now ready for him to move back in.  Filed Vitals:   12/13/14 1216 12/13/14 2148 12/13/14 2348 12/14/14 0648  BP: 121/70 126/69  145/82  Pulse: 101 97 94 87  Temp: 99.2 F (37.3 C) 99.4 F (37.4 C)    TempSrc: Oral Oral    Resp: 20 20 18 18   Height:      Weight:      SpO2: 97% 99% 99% 96%     General: Pleasant young male, overweight, lying comfortably propped up in bed.  Cardiovascular: S1 and S2 heard, RRR. No JVD, murmurs or pedal edema.   Respiratory: Clear to auscultation. No increased work of breathing.  Abdomen: soft, obese, nondistended. Suprapubic catheter intact. LLQ colostomy draining liquid green stools.  CNS: Alert and oriented. No cranial nerve deficits  Extremities: Lower extremity grade 0 x 5 power. Upper extremities grade 2-3 x 5 power. Contractures of hands.  Skin: Extensive sacral wounds, examined with Dr. Migdalia [name] on 7/29 -appear clean  Discharge  Instructions      Discharge Instructions    Call MD for:  difficulty breathing, headache or visual disturbances    Complete by:  As directed      Call MD for:  extreme fatigue    Complete by:  As directed      Call MD for:  hives    Complete by:  As directed      Call MD for:  persistant dizziness or light-headedness    Complete by:  As directed      Call MD for:  persistant nausea and vomiting    Complete by:  As directed      Call MD for:  redness, tenderness, or signs of infection (pain, swelling, redness, odor or green/yellow discharge around incision site)    Complete by:  As directed      Call MD for:  severe uncontrolled pain    Complete by:  As directed      Call MD for:  temperature >100.4    Complete by:  As directed      Diet - low sodium heart healthy    Complete by:  As directed      Discharge wound care:    Complete by:  As directed   1) Change positions and turn off bed sore areas regularly (every 2 hours if possible) 2) Change wet to dry dressings 2 -  3 times a day.     Increase activity slowly    Complete by:  As directed             Medication List    TAKE these medications        acetaminophen 500 MG tablet  Commonly known as:  TYLENOL  Take 1,000 mg by mouth every 6 (six) hours as needed (pain).     amLODipine 2.5 MG tablet  Commonly known as:  NORVASC  Take 2.5 mg by mouth every morning.     baclofen 20 MG tablet  Commonly known as:  LIORESAL  Take 20 mg by mouth 4 (four) times daily.     BEANO PO  Take 1 capsule by mouth 2 (two) times daily.     docusate sodium 100 MG capsule  Commonly known as:  COLACE  Take 100 mg by mouth 2 (two) times daily as needed for mild constipation.     ferrous sulfate 325 (65 FE) MG EC tablet  Take 1 tablet (325 mg total) by mouth 3 (three) times daily with meals.     furosemide 20 MG tablet  Commonly known as:  LASIX  Take 20 mg by mouth daily. As needed for fluid. Take with Klor-Con     metoCLOPramide  10 MG tablet  Commonly known as:  REGLAN  Take 1 tablet (10 mg total) by mouth 3 (three) times daily before meals.     multivitamin with minerals Tabs tablet  Take 1 tablet by mouth every morning.     lactose free nutrition Liqd  Take 237 mLs by mouth 3 (three) times daily between meals.     nutrition supplement (JUVEN) Pack  Take 1 packet by mouth 2 (two) times daily between meals.     pantoprazole 40 MG tablet  Commonly known as:  PROTONIX  Take 1 tablet (40 mg total) by mouth 2 (two) times daily before a meal.     potassium chloride SA 20 MEQ tablet  Commonly known as:  K-DUR,KLOR-CON  Take 2 tablets (40 mEq total) by mouth daily as needed (take with lasix for fluid).     sucralfate 1 G tablet  Commonly known as:  CARAFATE  Take 1 tablet (1 g total) by mouth 4 (four) times daily.     vancomycin 1,250 mg in sodium chloride 0.9 % 250 mL  Inject 1,250 mg into the vein daily. Complete on 12/22/2014.     VESICARE 10 MG tablet  Generic drug:  solifenacin  Take 10 mg by mouth daily.     vitamin C 500 MG tablet  Commonly known as:  ASCORBIC ACID  Take 500 mg by mouth every morning.       Follow-up Information    Follow up with [facility].   Why:  Home Health RN- for dressing changes, IV antibiotics & labs (repeat CBC, CMP and vancomycin trough on 12/17/14 and forward results to Dr. Bobby [name] with [facility] for Infectious Diseases)   Contact information:   [address] [place] [address] [phone]       Follow up with [name] Greenland, MD. Schedule an appointment as soon as possible for a visit in 1 week.   Specialty:  Internal Medicine   Contact information:   [address] [place] [address] [phone]       Schedule an appointment as soon as possible for a visit with [facility], DO.   Specialty:  Plastic Surgery  Contact information:   [address] [place] [address] [phone]         The results of significant diagnostics from this hospitalization (including imaging, microbiology, ancillary and laboratory) are listed below for reference.    Significant Diagnostic Studies: Ct Abdomen Pelvis Wo Contrast  11/15/2014   CLINICAL DATA:  Abdominal pain with nausea and vomiting. Quadriplegia. History of gastric ulcers. Symptoms developed acutely.  EXAM: CT ABDOMEN AND PELVIS WITHOUT CONTRAST  TECHNIQUE: Multidetector CT imaging of the abdomen and pelvis was performed following the standard protocol without IV contrast.  COMPARISON:  10/08/2014  FINDINGS: Small amount of pleural fluid on the right layering dependently. Mild dependent pulmonary atelectasis.  The liver has a normal appearance without contrast. No calcified gallstones or gross evidence of gallbladder inflammation. The spleen is normal. The pancreas is normal. The adrenal glands are normal. The kidneys are normal. No cyst, mass, stone or hydronephrosis. The aorta and IVC are normal. No retroperitoneal mass or lymphadenopathy. No free intraperitoneal fluid or air. The stomach is distended with fluid and air. One could question thickening in the antral region that could go along with peptic disease. No evidence of perforated ulcer. The remainder of the intestine appears normal without dilatation. There is a left lower quadrant colostomy. Mucous fistula contains fecal matter. Cystostomy tube in the bladder appears unremarkable. Extensive decubitus ulcers with communication with the right hip joint and chronic arthro pathic changes of the hips again noted.  IMPRESSION: The stomach is distended with fluid and air. One could question thickening in the gastric antrum that could go along with peptic inflammation. No sign of penetrating or perforated ulcer. This is not a high reliability finding by CT. The remainder the intestine appears unremarkable. No complication seen relative to the colostomy or mucous fistula.   Electronically Signed    By: Nelson Chimes M.D.   On: 11/15/2014 18:19   Dg Abd 1 View  12/08/2014   CLINICAL DATA:  Nausea, onset 13:00  EXAM: ABDOMEN - 1 VIEW  COMPARISON:  CT 11/15/2014  FINDINGS: There is prominent gastric distention with air. There is a milder degree of gaseous distention of small and large bowel. No free air is evident.  IMPRESSION: Gaseous distention of the entire bowel, most prominent in the stomach. This is probably worsened from 11/15/2014. Gastritis or gastric paresis is a possibility. Small bowel obstruction is not likely.   Electronically Signed   By: Andreas Newport M.D.   On: 12/08/2014 00:08   Ct Abdomen Pelvis W Contrast  12/08/2014   CLINICAL DATA:  Fever, nausea. 49 year old male quadriplegic with pressure ulcers.  EXAM: CT ABDOMEN AND PELVIS WITH CONTRAST  TECHNIQUE: Multidetector CT imaging of the abdomen and pelvis was performed using the standard protocol following bolus administration of intravenous contrast.  CONTRAST:  138mL OMNIPAQUE IOHEXOL 300 MG/ML  SOLN  COMPARISON:  Abdominal radiographs 12/07/2014, most recent CT 11/15/2014  FINDINGS: Lower chest: Bilateral small pleural effusions are noted with associated compressive atelectasis. Heart size upper limits of normal. Trace pericardial fluid.  Hepatobiliary: Liver and gallbladder are grossly unremarkable.  Pancreas: Normal  Spleen: Normal  Adrenals/Urinary Tract: Normal appearance to the adrenal glands and kidneys bilaterally. A suprapubic Foley catheter is in place and the bladder is decompressed. No radiopaque renal or ureteral calculus.  Stomach/Bowel: Normal appendix. Rectal wall thickness at upper limits of normal with overlying decubitus ulcer reidentified. No bowel dilatation. Interval resolution of previously seen gaseous bowel and stomach distension.  Vascular/Lymphatic: Mild atheromatous aortic calcification without aneurysm. Small retroperitoneal nodes are incidentally noted measuring 7 mm and smaller, without  lymphadenopathy. Bilateral, right greater than left, inguinal lymph nodes with subjective mild cortical prominence noted but normal size overall.  Other: Left lower quadrant ostomy.  No free air or fluid.  Musculoskeletal: Gross osseous deformity of the proximal femurs and pelvis reidentified with large overlying soft tissue defects and subcutaneous gas compatible with ulceration and underlying osteomyelitis, chronic.  IMPRESSION: No evidence for intra-abdominal or pelvic infectious process.   Electronically Signed   By: Conchita Paris M.D.   On: 12/08/2014 08:29   Ir Fluoro Guide Cv Line Left  11/20/2014   CLINICAL DATA:  49 year old with osteomyelitis of the pelvic region. PICC line needed for antibiotics.  EXAM: PLACEMENT OF A PERIPHERALLY INSERTED CENTRAL VENOUS CATHETER WITH ULTRASOUND AND FLUOROSCOPIC GUIDANCE  FLUOROSCOPY TIME:  36 seconds, 7 mGy  TECHNIQUE: The procedure was explained to the patient. The risks and benefits of the procedure were discussed and the patient's questions were addressed. Informed consent was obtained from the patient. The left arm was prepped with chlorhexidine, draped in the usual sterile fashion using maximum barrier technique (cap and mask, sterile gown, sterile gloves, large sterile sheet, hand hygiene and cutaneous antiseptic). Local anesthesia was attained by infiltration with 1% lidocaine.  Ultrasound demonstrated patency of the left brachial vein, and this was documented with an image. Under real-time ultrasound guidance, this vein was accessed with a 21 gauge micropuncture needle and image documentation was performed. The needle was exchanged over a guidewire for a peel-away sheath through which a 49 cm 5 [place] dual lumen power injectable PICC was advanced, and positioned with its tip at the lower SVC/right atrial junction. Fluoroscopy during the procedure and fluoro spot radiograph confirms appropriate catheter position. The catheter was flushed, secured to the skin  with Prolene sutures, and covered with a sterile dressing.  Estimated blood loss: Minimal  COMPLICATIONS: None.  The patient tolerated the procedure well.  IMPRESSION: Successful placement of a left arm PICC with sonographic and fluoroscopic guidance. The catheter is ready for use.   Electronically Signed   By: Markus Daft M.D.   On: 11/20/2014 09:20   Ir US Guide Vasc Access Left  11/20/2014   CLINICAL DATA:  49 year old with osteomyelitis of the pelvic region. PICC line needed for antibiotics.  EXAM: PLACEMENT OF A PERIPHERALLY INSERTED CENTRAL VENOUS CATHETER WITH ULTRASOUND AND FLUOROSCOPIC GUIDANCE  FLUOROSCOPY TIME:  36 seconds, 7 mGy  TECHNIQUE: The procedure was explained to the patient. The risks and benefits of the procedure were discussed and the patient's questions were addressed. Informed consent was obtained from the patient. The left arm was prepped with chlorhexidine, draped in the usual sterile fashion using maximum barrier technique (cap and mask, sterile gown, sterile gloves, large sterile sheet, hand hygiene and cutaneous antiseptic). Local anesthesia was attained by infiltration with 1% lidocaine.  Ultrasound demonstrated patency of the left brachial vein, and this was documented with an image. Under real-time ultrasound guidance, this vein was accessed with a 21 gauge micropuncture needle and image documentation was performed. The needle was exchanged over a guidewire for a peel-away sheath through which a 49 cm 5 [place] dual lumen power injectable PICC was advanced, and positioned with its tip at the lower SVC/right atrial junction. Fluoroscopy during the procedure and fluoro spot radiograph confirms appropriate catheter position. The catheter was flushed, secured to the skin with Prolene sutures, and covered with a sterile  dressing.  Estimated blood loss: Minimal  COMPLICATIONS: None.  The patient tolerated the procedure well.  IMPRESSION: Successful placement of a left arm PICC with  sonographic and fluoroscopic guidance. The catheter is ready for use.   Electronically Signed   By: Markus Daft M.D.   On: 11/20/2014 09:20   Dg Chest Port 1 View  12/07/2014   CLINICAL DATA:  Dyspnea.  Recent fever, nausea and hypotension.  EXAM: PORTABLE CHEST - 1 VIEW  COMPARISON:  09/05/2014  FINDINGS: There is mild curvilinear left base opacity due to scarring or atelectasis. There is no confluent airspace consolidation. Heart size is mildly enlarged and unchanged. Pulmonary vasculature is normal. There is no large effusion.  IMPRESSION: Mild linear scarring or atelectasis in the left base.   Electronically Signed   By: Andreas Newport M.D.   On: 12/07/2014 23:47   Dg Abd Portable 1v  12/11/2014   CLINICAL DATA:  Constipation, quadriplegia.  EXAM: PORTABLE ABDOMEN - 1 VIEW  COMPARISON:  Comparison is an acute abdomen series dated 10/08/2014.  FINDINGS: Overall bowel gas pattern is nonobstructive with no dilated small or large bowel loops identified. There is moderate gaseous distention of the stomach but this is less distention than what was seen on the previous exam. There is no evidence of soft tissue mass or abnormal fluid collection within the abdomen or pelvis. No evidence of free intraperitoneal air.  Again noted are the severe degenerative/dystrophic changes at each hip joint. No acute osseous abnormality identified.  IMPRESSION: No evidence of acute intra-abdominal or intrapelvic abnormality identified. No evidence of small or large bowel obstruction. There is moderate gaseous distention of the stomach but this is improved compared to the previous exam.   Electronically Signed   By: Franki Cabot M.D.   On: 12/11/2014 15:30    Microbiology: Recent Results (from the past 240 hour(s))  Blood Culture (routine x 2)     Status: None   Collection Time: 12/07/14 10:48 PM  Result Value Ref Range Status   Specimen Description BLOOD RIGHT HAND  Final   Special Requests BOTTLES DRAWN AEROBIC ONLY  3CC  Final   Culture  Setup Time   Final    GRAM POSITIVE COCCI IN CLUSTERS AEROBIC BOTTLE ONLY CRITICAL RESULT CALLED TO, READ BACK BY AND VERIFIED WITH: Donne Anon RN [phone] GREENR CONFIRMED BY T. [place]    Culture   Final    STAPHYLOCOCCUS SPECIES (COAGULASE NEGATIVE) Performed at [facility]    Report Status 12/11/2014 FINAL  Final   Organism ID, Bacteria STAPHYLOCOCCUS SPECIES (COAGULASE NEGATIVE)  Final      Susceptibility   Staphylococcus species (coagulase negative) - MIC*    CIPROFLOXACIN >=8 RESISTANT Resistant     ERYTHROMYCIN >=8 RESISTANT Resistant     GENTAMICIN <=0.5 SENSITIVE Sensitive     OXACILLIN >=4 RESISTANT Resistant     TETRACYCLINE <=1 SENSITIVE Sensitive     VANCOMYCIN 1 SENSITIVE Sensitive     TRIMETH/SULFA <=10 SENSITIVE Sensitive     CLINDAMYCIN <=0.25 INTERMEDIATE Intermediate     RIFAMPIN <=0.5 SENSITIVE Sensitive     Inducible Clindamycin NEGATIVE Sensitive     * STAPHYLOCOCCUS SPECIES (COAGULASE NEGATIVE)  Blood Culture (routine x 2)     Status: None   Collection Time: 12/07/14 11:34 PM  Result Value Ref Range Status   Specimen Description BLOOD RIGHT ARM  Final   Special Requests BOTTLES DRAWN AEROBIC ONLY 3CC  Final   Culture  Setup Time  Final    GRAM POSITIVE COCCI IN CLUSTERS AEROBIC BOTTLE ONLY CRITICAL RESULT CALLED TO, READ BACK BY AND VERIFIED WITH: Donne Anon RN [phone] GREEN R CONFIRMED BY T. [place]     Culture   Final    STAPHYLOCOCCUS SPECIES (COAGULASE NEGATIVE) SUSCEPTIBILITIES PERFORMED ON PREVIOUS CULTURE WITHIN THE LAST 5 DAYS. Performed at [facility]    Report Status 12/11/2014 FINAL  Final  Urine culture     Status: None   Collection Time: 12/07/14 11:39 PM  Result Value Ref Range Status   Specimen Description URINE, CATHETERIZED  Final   Special Requests NONE  Final   Culture   Final    >=100,000 COLONIES/mL PSEUDOMONAS AERUGINOSA 60,000 COLONIES/ml PROVIDENCIA STUARTII Performed  at [facility]    Report Status 12/11/2014 FINAL  Final   Organism ID, Bacteria PSEUDOMONAS AERUGINOSA  Final   Organism ID, Bacteria PROVIDENCIA STUARTII  Final      Susceptibility   Pseudomonas aeruginosa - MIC*    CEFTAZIDIME 4 SENSITIVE Sensitive     CIPROFLOXACIN >=4 RESISTANT Resistant     GENTAMICIN <=1 SENSITIVE Sensitive     IMIPENEM >=16 RESISTANT Resistant     PIP/TAZO 16 SENSITIVE Sensitive     CEFEPIME 8 SENSITIVE Sensitive     * >=100,000 COLONIES/mL PSEUDOMONAS AERUGINOSA   Providencia stuartii - MIC*    AMPICILLIN >=32 RESISTANT Resistant     CEFAZOLIN >=64 RESISTANT Resistant     CEFTRIAXONE <=1 SENSITIVE Sensitive     CIPROFLOXACIN >=4 RESISTANT Resistant     GENTAMICIN 8 RESISTANT Resistant     IMIPENEM 1 SENSITIVE Sensitive     NITROFURANTOIN 128 RESISTANT Resistant     TRIMETH/SULFA 160 RESISTANT Resistant     AMPICILLIN/SULBACTAM >=32 RESISTANT Resistant     PIP/TAZO <=4 SENSITIVE Sensitive     * 60,000 COLONIES/ml PROVIDENCIA STUARTII  Culture, blood (routine x 2)     Status: None (Preliminary result)   Collection Time: 12/10/14  7:30 PM  Result Value Ref Range Status   Specimen Description BLOOD RIGHT HAND  Final   Special Requests BOTTLES DRAWN AEROBIC ONLY 5ML  Final   Culture   Final    NO GROWTH 3 DAYS Performed at [facility]    Report Status PENDING  Incomplete     Labs: Basic Metabolic Panel:  Recent Labs Lab 12/07/14 2248 12/08/14 0515 12/09/14 0500 12/10/14 0430 12/10/14 0431  NA 130* 135 138  --  142  K 3.5 3.4* 3.5  --  4.1  CL 99* 104 106  --  110  CO2 22 23 24   --  24  GLUCOSE 94 94 89  --  113*  BUN 32* 28* 19  --  23*  CREATININE 1.00 1.01 0.95  --  0.89  CALCIUM 8.4* 8.1* 8.1*  --  8.5*  MG  --  1.3*  --  1.7  --   PHOS  --  4.0  --   --   --    Liver Function Tests:  Recent Labs Lab 12/07/14 2248 12/08/14 0515  AST 15 13*  ALT 13* 11*  ALKPHOS 80 65  BILITOT 0.6 0.5  PROT 7.9 6.5  ALBUMIN  3.3* 2.7*   No results for input(s): LIPASE, AMYLASE in the last 168 hours. No results for input(s): AMMONIA in the last 168 hours. CBC:  Recent Labs Lab 12/07/14 2248 12/08/14 0515 12/09/14 0500 12/10/14 0431 12/11/14 0600  WBC 11.0* 9.2 6.2 6.5 9.1  NEUTROABS 7.3  --   --   --   --   HGB 9.1* 7.4* 7.7* 7.5* 7.9*  HCT 28.9* 24.5* 24.8* 24.9* 26.9*  MCV 76.7* 76.8* 79.0 80.1 79.1  PLT 346 309 302 300 360   Cardiac Enzymes: No results for input(s): CKTOTAL, CKMB, CKMBINDEX, TROPONINI in the last 168 hours. BNP: BNP (last 3 results) No results for input(s): BNP in the last 8760 hours.  ProBNP (last 3 results) No results for input(s): PROBNP in the last 8760 hours.  CBG: No results for input(s): GLUCAP in the last 168 hours.    Signed:  Vernell Leep, MD, FACP, FHM. [facility] Pager [phone]  If 7PM-7AM, please contact night-coverage [email or web address] Password [facility] 12/14/2014, 11:48 AM

## 2014-12-14 NOTE — Discharge Instructions (Signed)
Bacteremia °Bacteremia occurs when bacteria get in your blood. Normal blood does not usually have bacteria. Bacteremia is one way infections can spread from one part of the body to another. °CAUSES  °· Causes may include anything that allows bacteria to get into the body. Examples are: °· Catheters. °· Intravenous (IV) access tubes. °· Cuts or scrapes of the skin. °· Temporary bacteremia may occur during dental procedures, while brushing your teeth, or during a bowel movement. This rarely causes any symptoms or medical problems. °· Bacteria may also get in the bloodstream as a complication of a bacterial infection elsewhere. This includes infected wounds and bacterial infections of the: °· Lungs (pneumonia). °· Kidneys (pyelonephritis). °· Intestines (enteritis, colitis). °· Organs in the abdomen (appendicitis, cholecystitis, diverticulitis). °SYMPTOMS  °The body is usually able to clear small numbers of bacteria out of the blood quickly. Brief bacteremia usually does not cause problems.  °· Problems can occur if the bacteria start to grow in number or spread to other parts of the body. If the bacteria start growing, you may develop: °· Chills. °· Fever. °· Nausea. °· Vomiting. °· Sweating. °· Lightheadedness and low blood pressure. °· Pain. °· If bacteria start to grow in the linings around the brain, it is called meningitis. This can cause severe headaches, many other problems, and even death. °· If bacteria start to grow in a joint, it causes arthritis with painful joints. If bacteria start to grow in a bone, it is called osteomyelitis. °· Bacteria from the blood can also cause sores (abscesses) in many organs, such as the muscle, liver, spleen, lungs, brain, and kidneys. °DIAGNOSIS  °· This condition is diagnosed by cultures of the blood. °· Cultures may also be taken from other parts of the body that are thought to be causing the bacteremia. A small piece of tissue, fluid, or other product of the body is  sampled. The sample is then put on a growth plate to see if any bacteria grows. °· Other lab tests may be done and the results may be abnormal. °TREATMENT  °Treatment requires a stay in the hospital. You will be given antibiotic medicine through an IV access tube. °PREVENTION  °People with an increased risk of developing bacteremia or complications may be given antibiotics before certain procedures. Examples are: °· A person with a heart murmur or artificial heart valve, before having his or her teeth cleaned. °· Before having a surgical or other invasive procedure. °· Before having a bowel procedure. °Document Released: 02/13/2006 Document Revised: 07/25/2011 Document Reviewed: 11/25/2010 °ExitCare® Patient Information ©2015 [employer]. This information is not intended to replace advice given to you by your health care provider. Make sure you discuss any questions you have with your health care provider. °Urinary Tract Infection °Urinary tract infections (UTIs) can develop anywhere along your urinary tract. Your urinary tract is your body's drainage system for removing wastes and extra water. Your urinary tract includes two kidneys, two ureters, a bladder, and a urethra. Your kidneys are a pair of bean-shaped organs. Each kidney is about the size of your fist. They are located below your ribs, one on each side of your spine. °CAUSES °Infections are caused by microbes, which are microscopic organisms, including fungi, viruses, and bacteria. These organisms are so small that they can only be seen through a microscope. Bacteria are the microbes that most commonly cause UTIs. °SYMPTOMS  °Symptoms of UTIs may vary by age and gender of the patient and by the location of   the infection. Symptoms in young women typically include a frequent and intense urge to urinate and a painful, burning feeling in the bladder or urethra during urination. Older women and men are more likely to be tired, shaky, and weak and have muscle  aches and abdominal pain. A fever may mean the infection is in your kidneys. Other symptoms of a kidney infection include pain in your back or sides below the ribs, nausea, and vomiting. °DIAGNOSIS °To diagnose a UTI, your caregiver will ask you about your symptoms. Your caregiver also will ask to provide a urine sample. The urine sample will be tested for bacteria and white blood cells. White blood cells are made by your body to help fight infection. °TREATMENT  °Typically, UTIs can be treated with medication. Because most UTIs are caused by a bacterial infection, they usually can be treated with the use of antibiotics. The choice of antibiotic and length of treatment depend on your symptoms and the type of bacteria causing your infection. °HOME CARE INSTRUCTIONS °· If you were prescribed antibiotics, take them exactly as your caregiver instructs you. Finish the medication even if you feel better after you have only taken some of the medication. °· Drink enough water and fluids to keep your urine clear or pale yellow. °· Avoid caffeine, tea, and carbonated beverages. They tend to irritate your bladder. °· Empty your bladder often. Avoid holding urine for long periods of time. °· Empty your bladder before and after sexual intercourse. °· After a bowel movement, women should cleanse from front to back. Use each tissue only once. °SEEK MEDICAL CARE IF:  °· You have back pain. °· You develop a fever. °· Your symptoms do not begin to resolve within 3 days. °SEEK IMMEDIATE MEDICAL CARE IF:  °· You have severe back pain or lower abdominal pain. °· You develop chills. °· You have nausea or vomiting. °· You have continued burning or discomfort with urination. °MAKE SURE YOU:  °· Understand these instructions. °· Will watch your condition. °· Will get help right away if you are not doing well or get worse. °Document Released: 02/09/2005 Document Revised: 11/01/2011 Document Reviewed: 06/10/2011 °ExitCare® Patient Information  ©2015 [employer]. This information is not intended to replace advice given to you by your health care provider. Make sure you discuss any questions you have with your health care provider. ° °

## 2014-12-14 NOTE — Care Management Important Message (Signed)
Important Message  Patient Details  Name: [name] [name] MRN: [id number] Date of Birth: 07-16-1965   Medicare Important Message Given:  Yes-third notification given    Apolonio Schneiders, RN 12/14/2014, 3:11 PM

## 2014-12-14 NOTE — Care Management Note (Signed)
Case Management Note  Patient Details  Name: [name] [name] MRN: [id number] Date of Birth: [birth year]  Subjective/Objective:                   Sepsis  Action/Plan:  Home Health Expected Discharge Date:   12/14/14               Expected Discharge Plan:     In-House Referral:     Discharge planning Services  CM Consult  Post Acute Care Choice:  Home Health Choice offered to:  Patient  DME Arranged:    DME Agency:     HH Arranged:  RN, IV Antibiotics HH Agency:  [facility]  Status of Service:  Completed, signed off  Medicare Important Message Given:  Yes-second notification given Date Medicare IM Given:    Medicare IM give by:    Date Additional Medicare IM Given:    Additional Medicare Important Message give by:     If discussed at [place] of Stay Meetings, dates discussed:    Additional Comments:  CM spoke with patient at the bedside. His bed has been moved to his new residence. His room is being arranged this morning. He feels he will be able to return home this afternoon. Tifanny at [facility] notified of plan for discharge today. [facility]  RN will be scheduled to visit this evening around 2000. Will fax prescriptions to [facility].  Apolonio Schneiders, RN 12/14/2014, 11:05 AM

## 2014-12-14 NOTE — Care Management (Signed)
CM spoke with patient. He states the motor for his bed is in a box. The caregiver is not sure which box the motor is packed in but is looking for it. She is able to attach the motor to the bed once she locates it. CM offered to call the caregiver. He declined stating she is upset with the previous landlord. It would not be a good time to call her. Discussed the possibility of his insurance not covering additional days if he ready for discharge. He verbalizes understanding. Attempted to contact [facility]. Presenter, broadcasting BSN CCM

## 2014-12-15 ENCOUNTER — Encounter (HOSPITAL_BASED_OUTPATIENT_CLINIC_OR_DEPARTMENT_OTHER): Payer: Medicare Other | Attending: Plastic Surgery

## 2014-12-15 ENCOUNTER — Ambulatory Visit: Payer: Medicare Other | Admitting: Infectious Diseases

## 2014-12-15 DIAGNOSIS — L89154 Pressure ulcer of sacral region, stage 4: Secondary | ICD-10-CM | POA: Insufficient documentation

## 2014-12-15 DIAGNOSIS — M8610 Other acute osteomyelitis, unspecified site: Secondary | ICD-10-CM | POA: Insufficient documentation

## 2014-12-15 DIAGNOSIS — S31109D Unspecified open wound of abdominal wall, unspecified quadrant without penetration into peritoneal cavity, subsequent encounter: Secondary | ICD-10-CM | POA: Insufficient documentation

## 2014-12-15 DIAGNOSIS — L89153 Pressure ulcer of sacral region, stage 3: Secondary | ICD-10-CM | POA: Insufficient documentation

## 2014-12-15 DIAGNOSIS — X58XXXD Exposure to other specified factors, subsequent encounter: Secondary | ICD-10-CM | POA: Insufficient documentation

## 2014-12-15 LAB — CREATININE, SERUM
CREATININE: 0.56 mg/dL — AB (ref 0.61–1.24)
GFR calc Af Amer: >60 mL/min (ref >60)
GFR calc non Af Amer: >60 mL/min (ref >60)

## 2014-12-15 LAB — CULTURE, BLOOD (ROUTINE X 2): Culture: NO GROWTH

## 2014-12-15 MED ORDER — HEPARIN SOD (PORK) LOCK FLUSH 100 UNIT/ML IV SOLN
250.0000 [IU] | INTRAVENOUS | Status: AC | PRN
  Administered 2014-12-15 (×2): 250 [IU]

## 2014-12-15 NOTE — Progress Notes (Signed)
Dressings to buttocks /sacrum changed.dressing to RLE and right upper back changed;skin dry flaky

## 2014-12-15 NOTE — Progress Notes (Addendum)
Patient was scheduled for discharge on 12/14/14. However there was some issue about motor for his home bed and he could not leave yesterday. All home arrangements have apparently been made for today. Patient has no new complaints. Vital signs and physical exam stable and unchanged. Surveillance blood culture remains negative to date. Plan to DC home today.  Vernell Leep, MD, FACP, FHM. [facility] Pager [phone]  If 7PM-7AM, please contact night-coverage [email or web address] Password [email or web address] 12/15/2014, 1:53 PM

## 2014-12-15 NOTE — Care Management Important Message (Signed)
Important Message  Patient Details  Name: DOMINIC MAHANEY MRN: [id number] Date of Birth: 02-02-1966   Medicare Important Message Given:  Yes-fourth notification given    Shelda Altes 12/15/2014, 2:55 [place] Message  Patient Details  Name: CORAN DIPAOLA MRN: [id number] Date of Birth: Jul 27, 1965   Medicare Important Message Given:  Yes-fourth notification given    Shelda Altes 12/15/2014, 2:55 PM

## 2014-12-15 NOTE — Progress Notes (Signed)
CSW consulted for transportation needs. Patient will need non-emergency ambulance transport home. CSW confirmed home address with patient [phone] [place], [place]).   [facility] called for transport to pickup at 2pm to give time for family to get bed setup at his new residence.   No other CSW needs identified - CSW signing off.   Raynaldo Opitz, [facility] Clinical Social Worker cell #: [phone]

## 2014-12-15 NOTE — Progress Notes (Signed)
Pt states he has already made his f/u appts w/ the MD's listed on d/c AVS

## 2014-12-15 NOTE — Progress Notes (Signed)
Spoke with pt who states that he does not want to appeal hospital stay at this time with [facility].  Pt was instructed to call [facility] to rely this information.

## 2014-12-17 IMAGING — MR MR PELVIS WO/W CM
4 of 8 series · 18 of 48 positions shown · IV contrast (multihance)
Comparison: Pelvic CTs 02/29/2012 and 01/18/2012.

CLINICAL DATA: Sacral decubitus ulcer with foul-smelling discharge.
History of osteomyelitis treated with antibiotics.

MRI PELVIS WITHOUT AND WITH CONTRAST
TECHNIQUE: Multiplanar multisequence MR imaging of the pelvis was
performed both before and after administration of intravenous
contrast.
Contrast: 20mL MULTIHANCE GADOBENATE DIMEGLUMINE 529 MG/ML IV SOLN

[Series 3: T1 · axial · 6.0mm · 0.82mm/px · z∈[-104,+121]mm · 7 of 31 slices shown (1 of 2)]
[im 1/31]
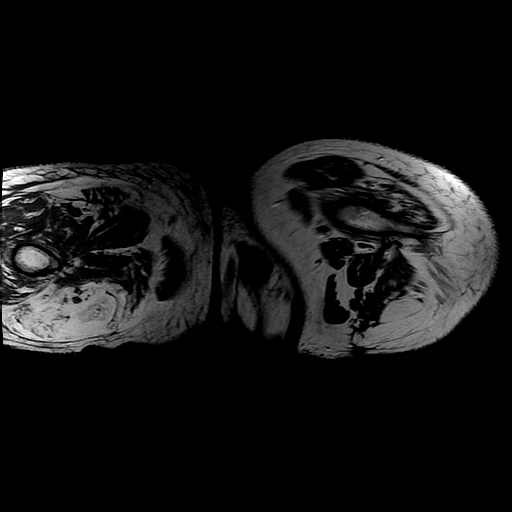
[im 6/31]
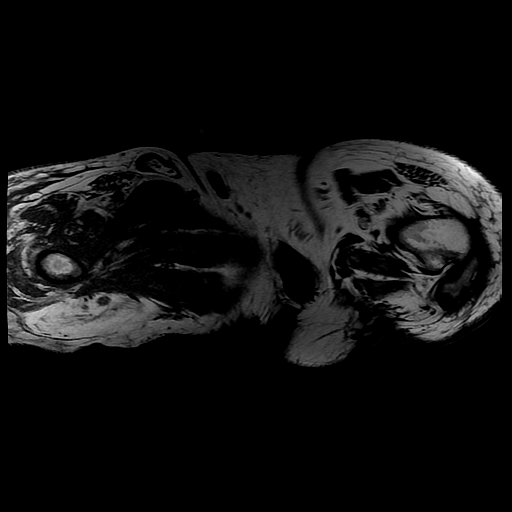
[im 11/31]
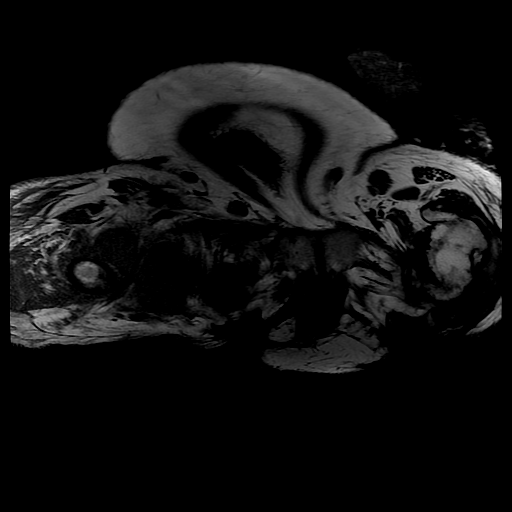
[im 16/31]
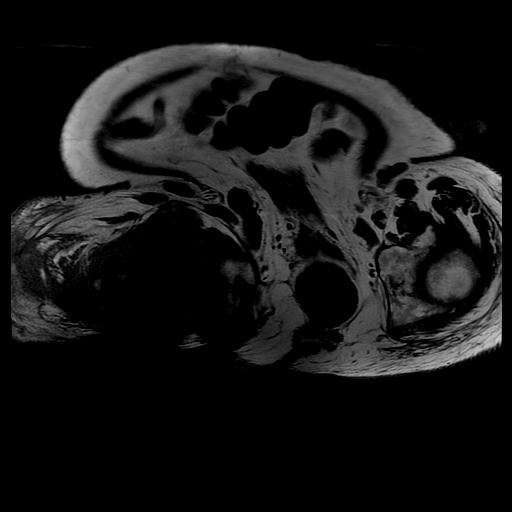
[im 21/31]
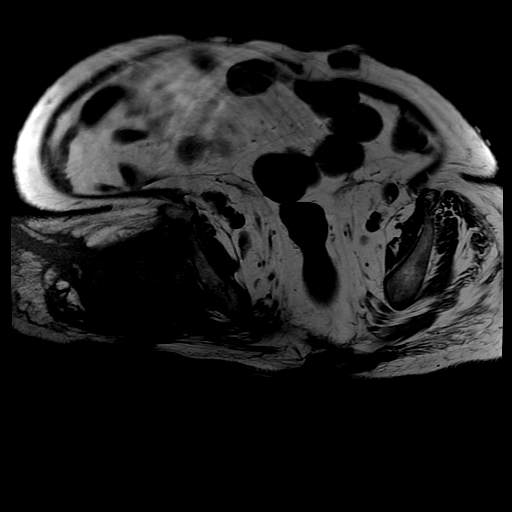
[im 26/31]
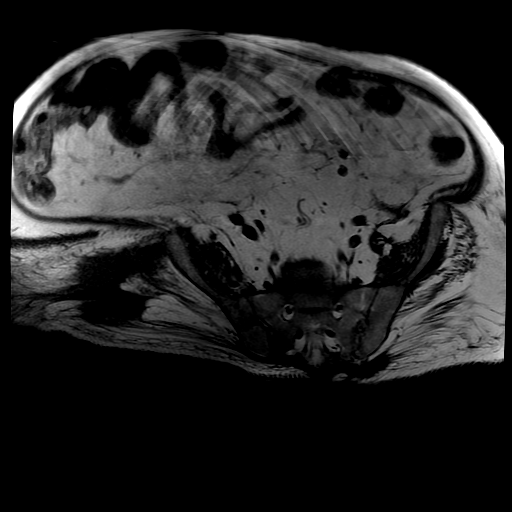
[im 31/31]
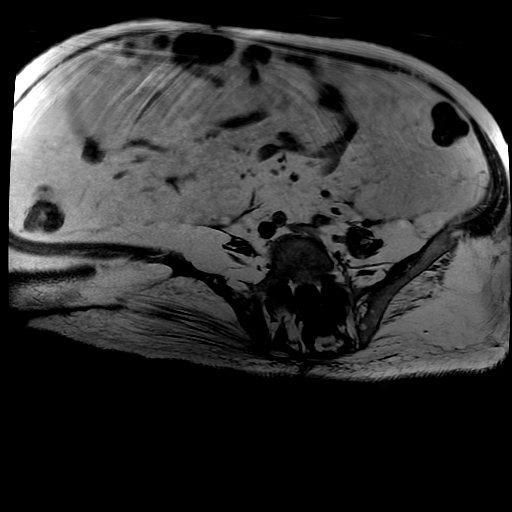

[Series 4: T2 fat-sat · axial · 6.0mm · 0.82mm/px · z∈[-104,+121]mm · 5 of 31 slices shown]
[im 1/31]
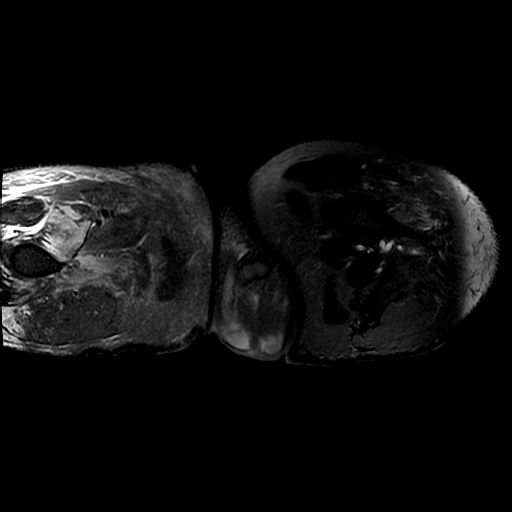
[im 7/31]
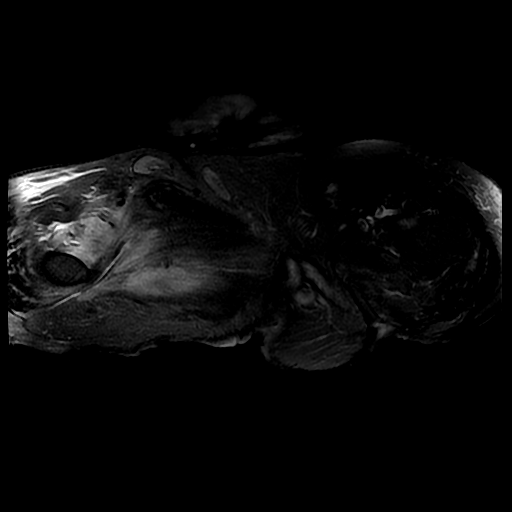
[im 13/31]
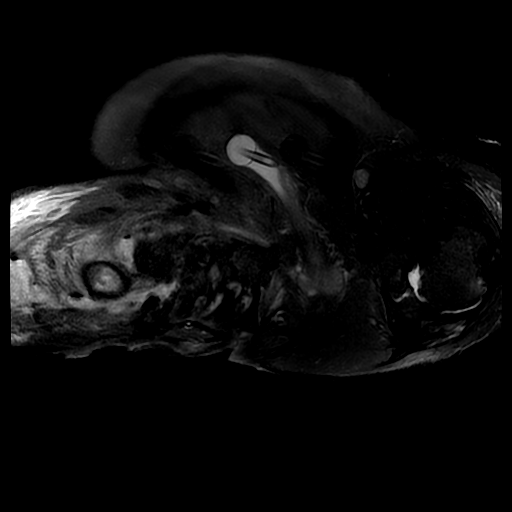
[im 19/31]
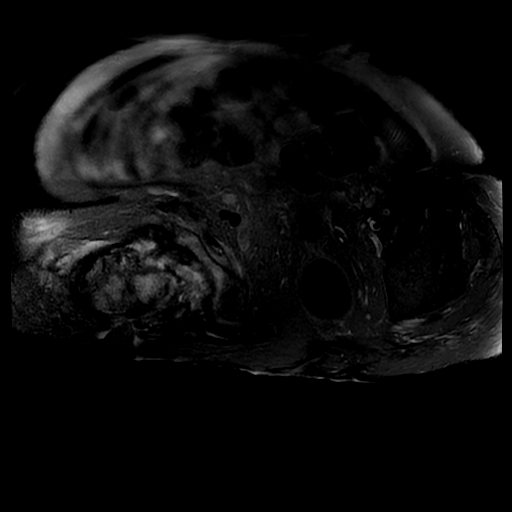
[im 31/31]
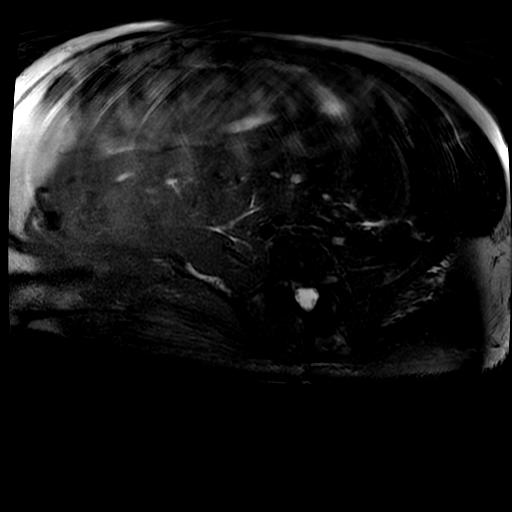

[Series 5: T1 fat-sat · axial · non-contrast · 6.0mm · 0.82mm/px · z∈[-59,+121]mm · 3 of 31 slices shown]
[im 7/31]
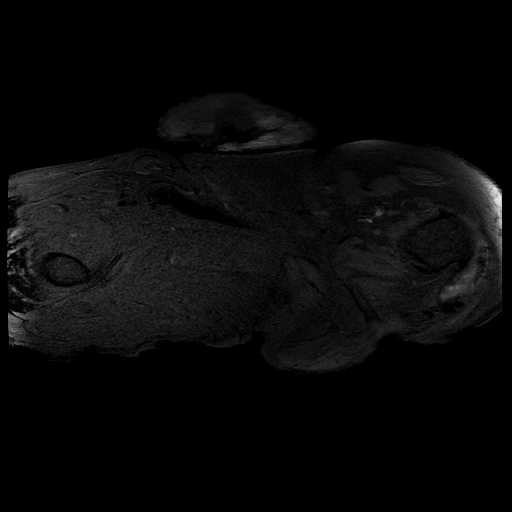
[im 19/31]
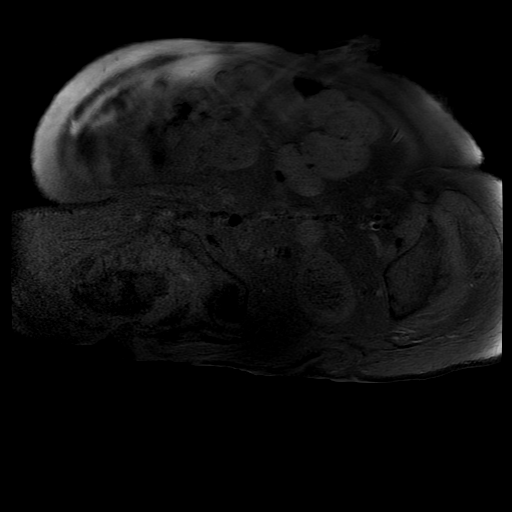
[im 31/31]
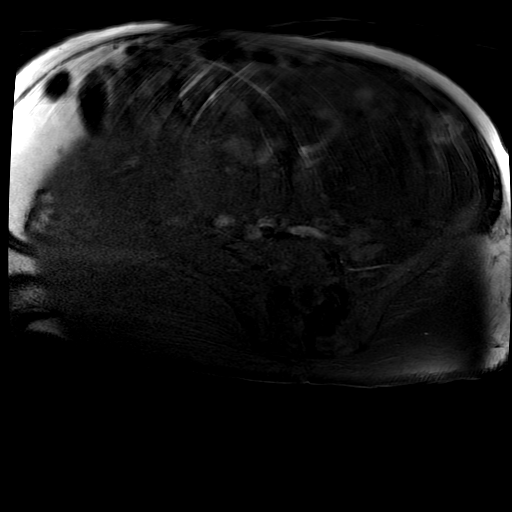

[Series 6: T1 · coronal · 6.0mm · 0.86mm/px · 3 of 26 slices shown (2 of 2)]
[im 1/26]
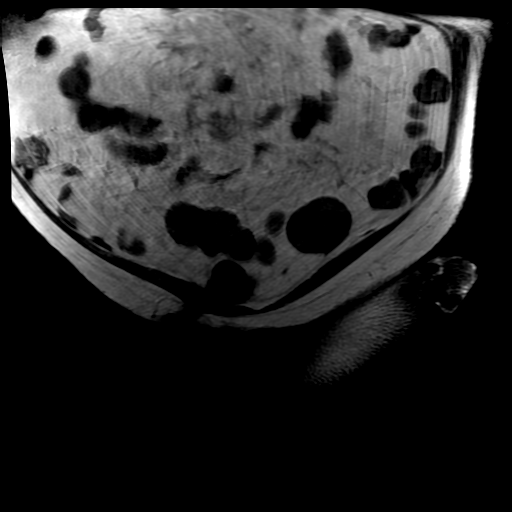
[im 13/26]
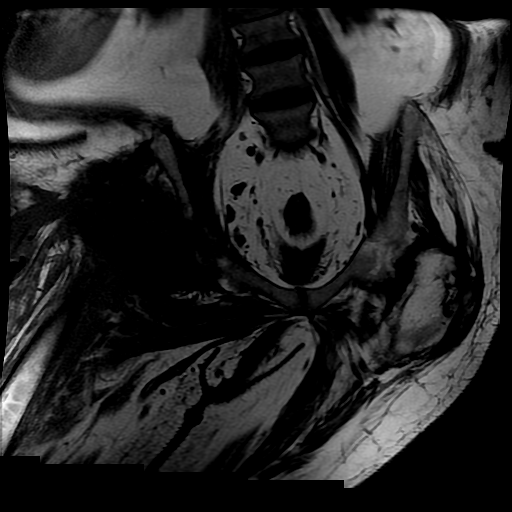
[im 26/26]
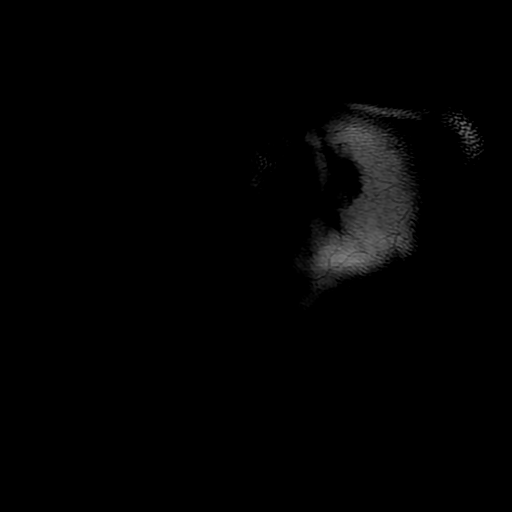

[18 of 48 positions shown; findings below may reference images not displayed]

FINDINGS: There is chronic dislocation of the right hip and chronic
extensive fragmentation of the right femoral head and neck.  There
is extensive heterotopic ossification surrounding the dislocated
femoral head.  There is a deep decubitus ulcer posterior to the
dislocated proximal femur which extends to the bone as on the prior
CT. There are multiple peripherally enhancing fluid collections
associated with the dislocated femoral head and lateral to the
proximal femur.  The latter are incompletely imaged.  There is
diffuse surrounding synovial and muscular enhancement. Edema and
enhancement track along the right iliacus muscle.

There is irregular sclerosis, edema and enhancement within the
proximal right femur.  Similar involvement is present within the
right iliac bone and acetabulum.  There is chronic deformity of the
right ischium.

The sacroiliac joints are chronically fused without adjacent marrow
edema.  There is chronic deformity of the proximal left femur with
surrounding heterotopic ossification.  The left femur demonstrates
no suspicious marrow signal or enhancement.

The patient has a descending colostomy and indwelling bladder
catheter.  Multiple prominent pelvic and inguinal lymph nodes are
present, likely reactive and similar to prior CT.  No intrapelvic
fluid collections are identified.
IMPRESSION: 1.  Chronic dislocation of the right hip and fragmentation of the
proximal right femur are similar to prior CT.  Comparison between
modalities is limited.
2.  Sclerosis, edema and enhancement within the proximal femur and
adjacent acetabulum are consistent with chronic osteomyelitis.
Superimposed acute osteomyelitis is likely.
3.  Extensive inflammatory changes surrounding the right hip with
large irregular peripherally enhancing fluid collections, synovial
enhancement and muscular enhancement adjacent to a large decubitus
ulcer.  These findings are consistent with chronic soft tissue
infection with abscess formation.

## 2014-12-17 IMAGING — CR DG CHEST 1V PORT
2 series · 2 of 2 positions shown · non-contrast
Comparison: 04/22/2012, 10/26/2010 CT

CLINICAL DATA: Congestion, chills

PORTABLE CHEST - 1 VIEW

[AP (1 of 2)]
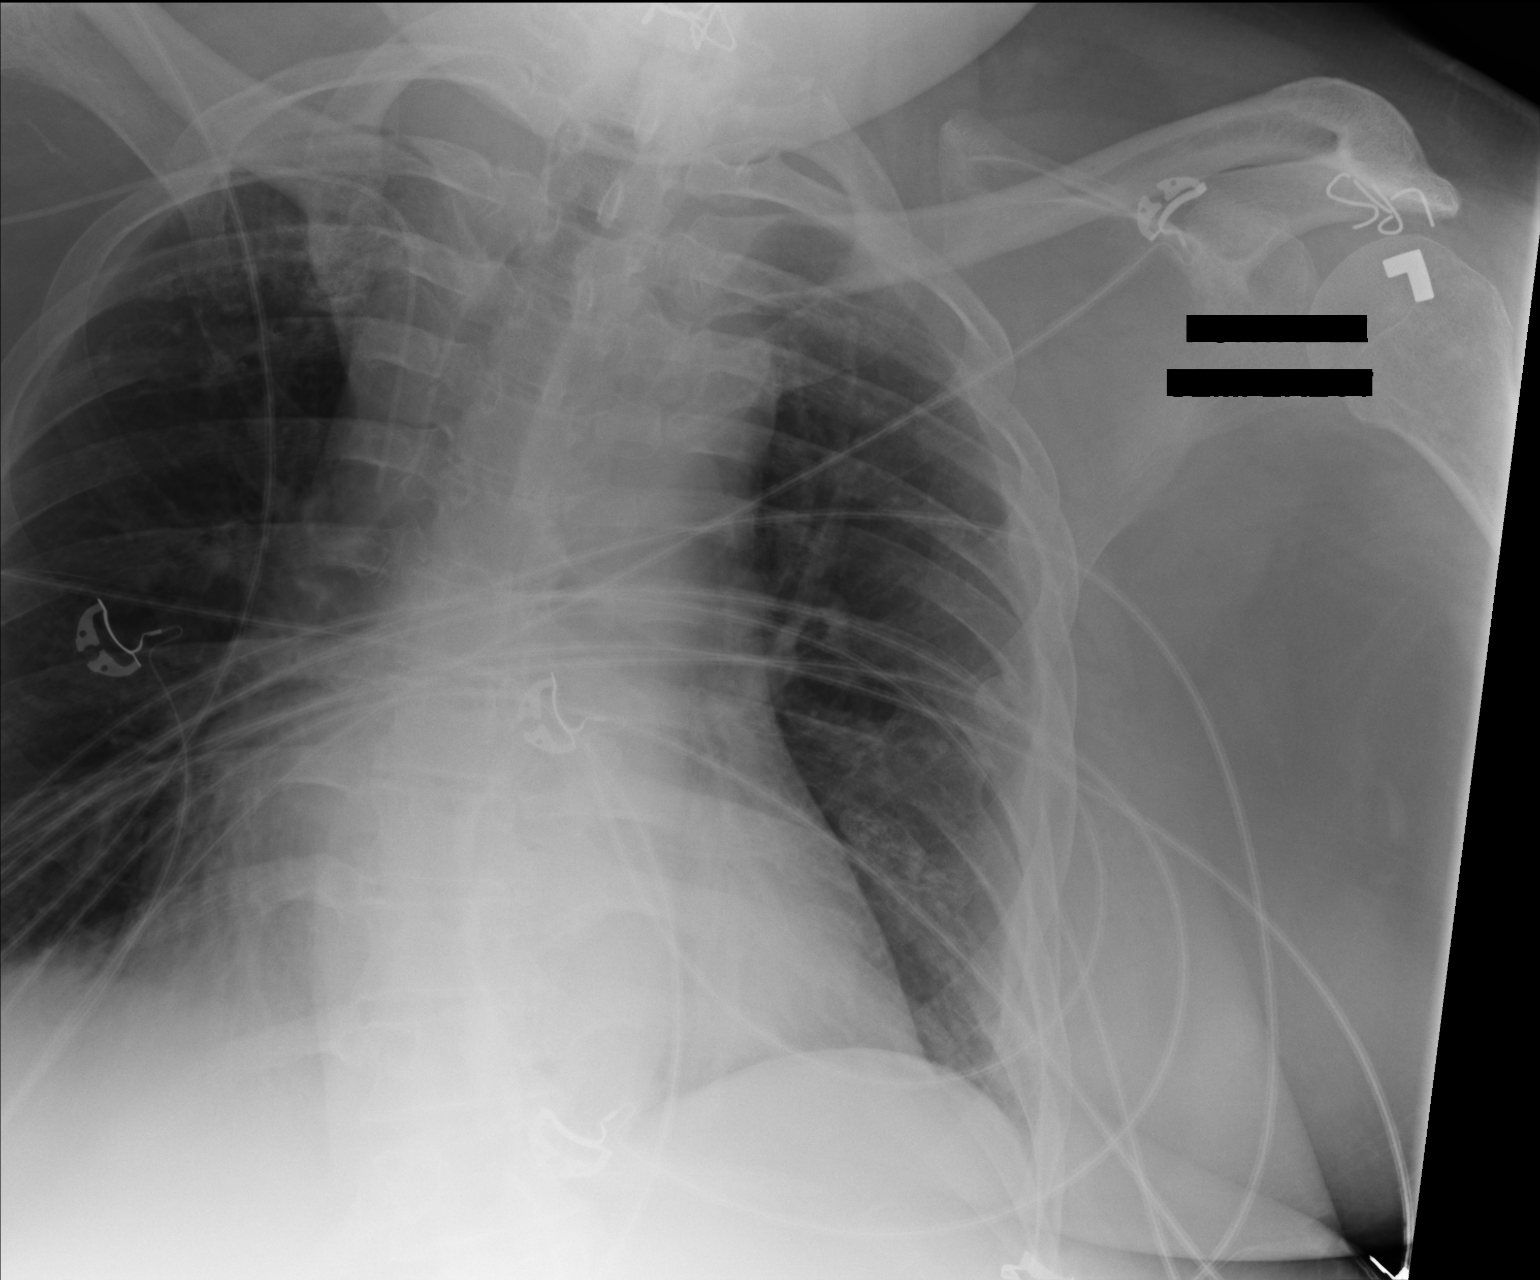

[AP (2 of 2)]
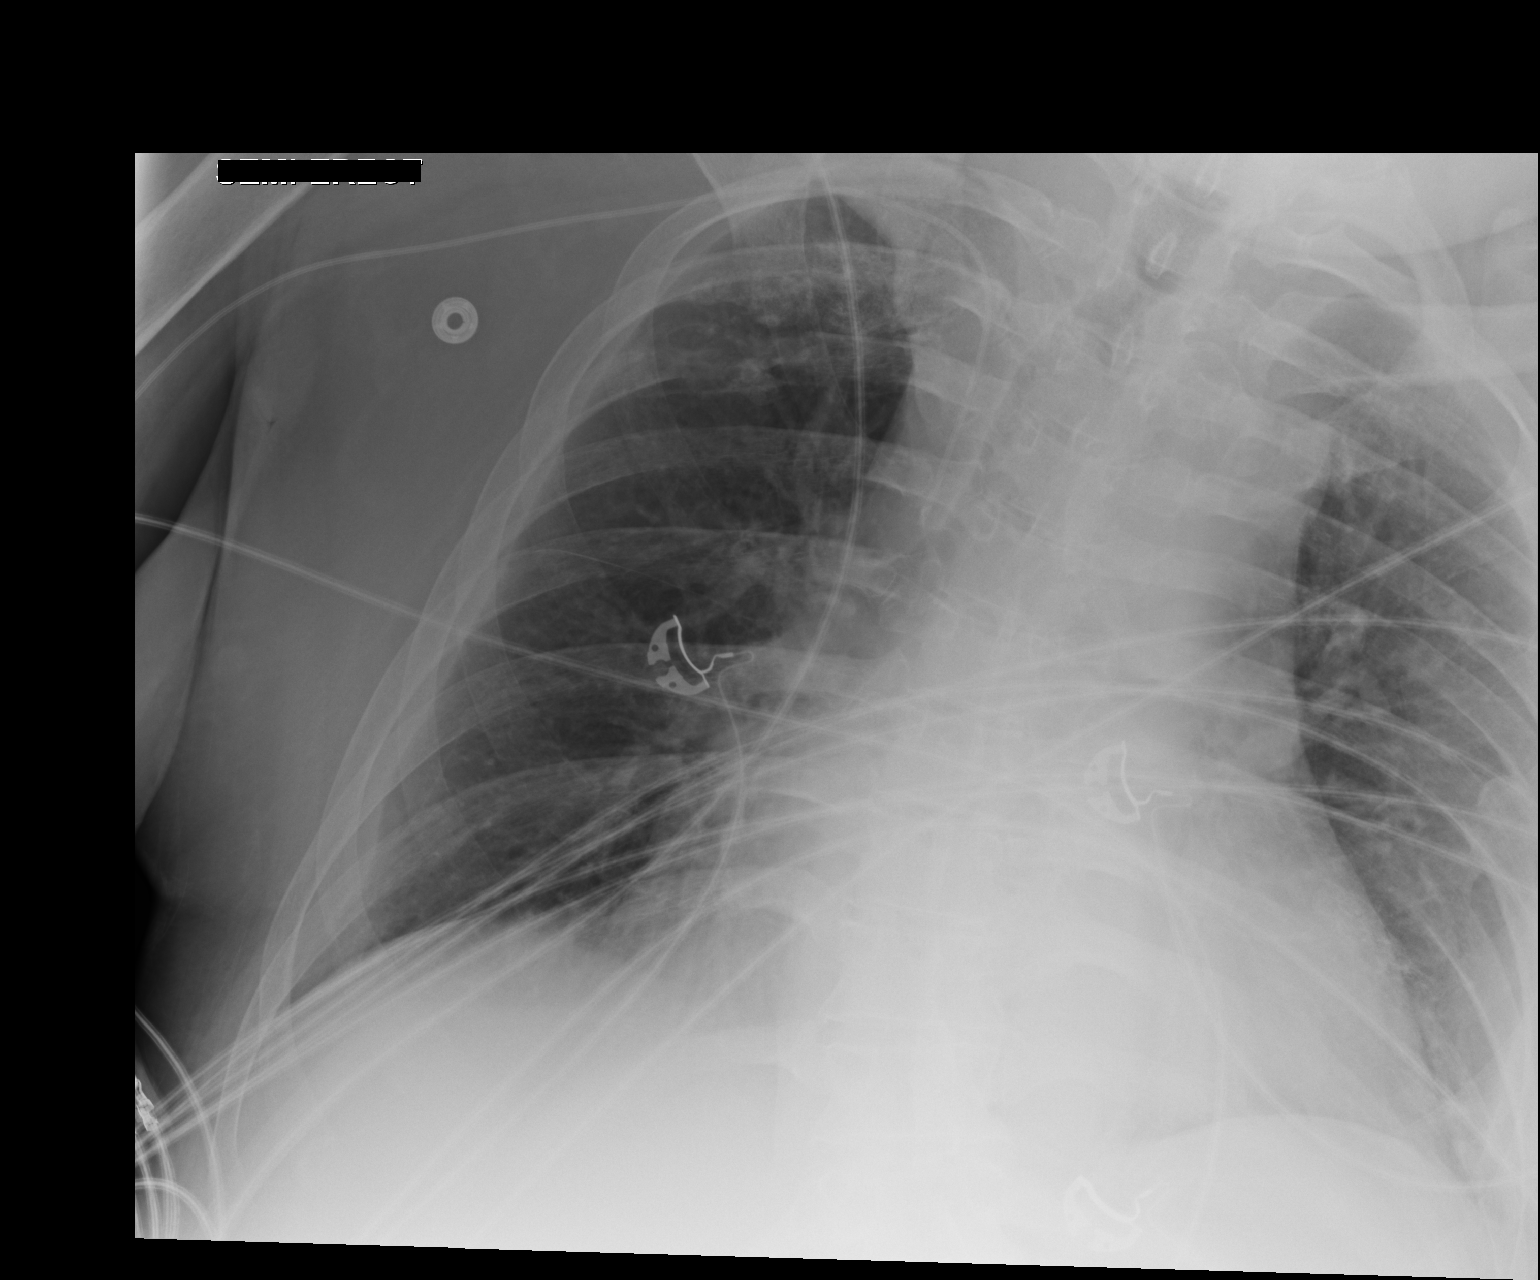

[2 of 2 positions shown; findings below may reference images not displayed]

FINDINGS: Prominent cardiomediastinal contours. Multiple small
calcific densities projecting over the right upper lung and
scapula, likely correspond to post-traumatic changes described on
prior CT. Mild dependent atelectasis.  No pleural effusion or
pneumothorax.  Prior left rib fractures again noted.  Mild
rightward thoracic spine curvature may be exaggerated by
positioning. Right PICC tip projects over the proximal SVC.
IMPRESSION: Chronic changes as above.

Cardiomediastinal prominence is similar to prior.

## 2014-12-22 ENCOUNTER — Encounter: Payer: Self-pay | Admitting: Infectious Diseases

## 2014-12-22 ENCOUNTER — Ambulatory Visit (INDEPENDENT_AMBULATORY_CARE_PROVIDER_SITE_OTHER): Payer: Medicare Other | Admitting: Infectious Diseases

## 2014-12-22 VITALS — BP 111/82 | HR 118 | Temp 97.4°F

## 2014-12-22 DIAGNOSIS — N390 Urinary tract infection, site not specified: Secondary | ICD-10-CM

## 2014-12-22 DIAGNOSIS — T798XXD Other early complications of trauma, subsequent encounter: Secondary | ICD-10-CM | POA: Diagnosis not present

## 2014-12-22 DIAGNOSIS — R7881 Bacteremia: Secondary | ICD-10-CM

## 2014-12-22 DIAGNOSIS — L89154 Pressure ulcer of sacral region, stage 4: Secondary | ICD-10-CM

## 2014-12-22 IMAGING — CR DG ABDOMEN 1V
3 series · 3 of 3 positions shown · non-contrast
Comparison: CT 02/29/2012

CLINICAL DATA: Vomiting

ABDOMEN - 1 VIEW

[t abdomen supine (1 of 3)]
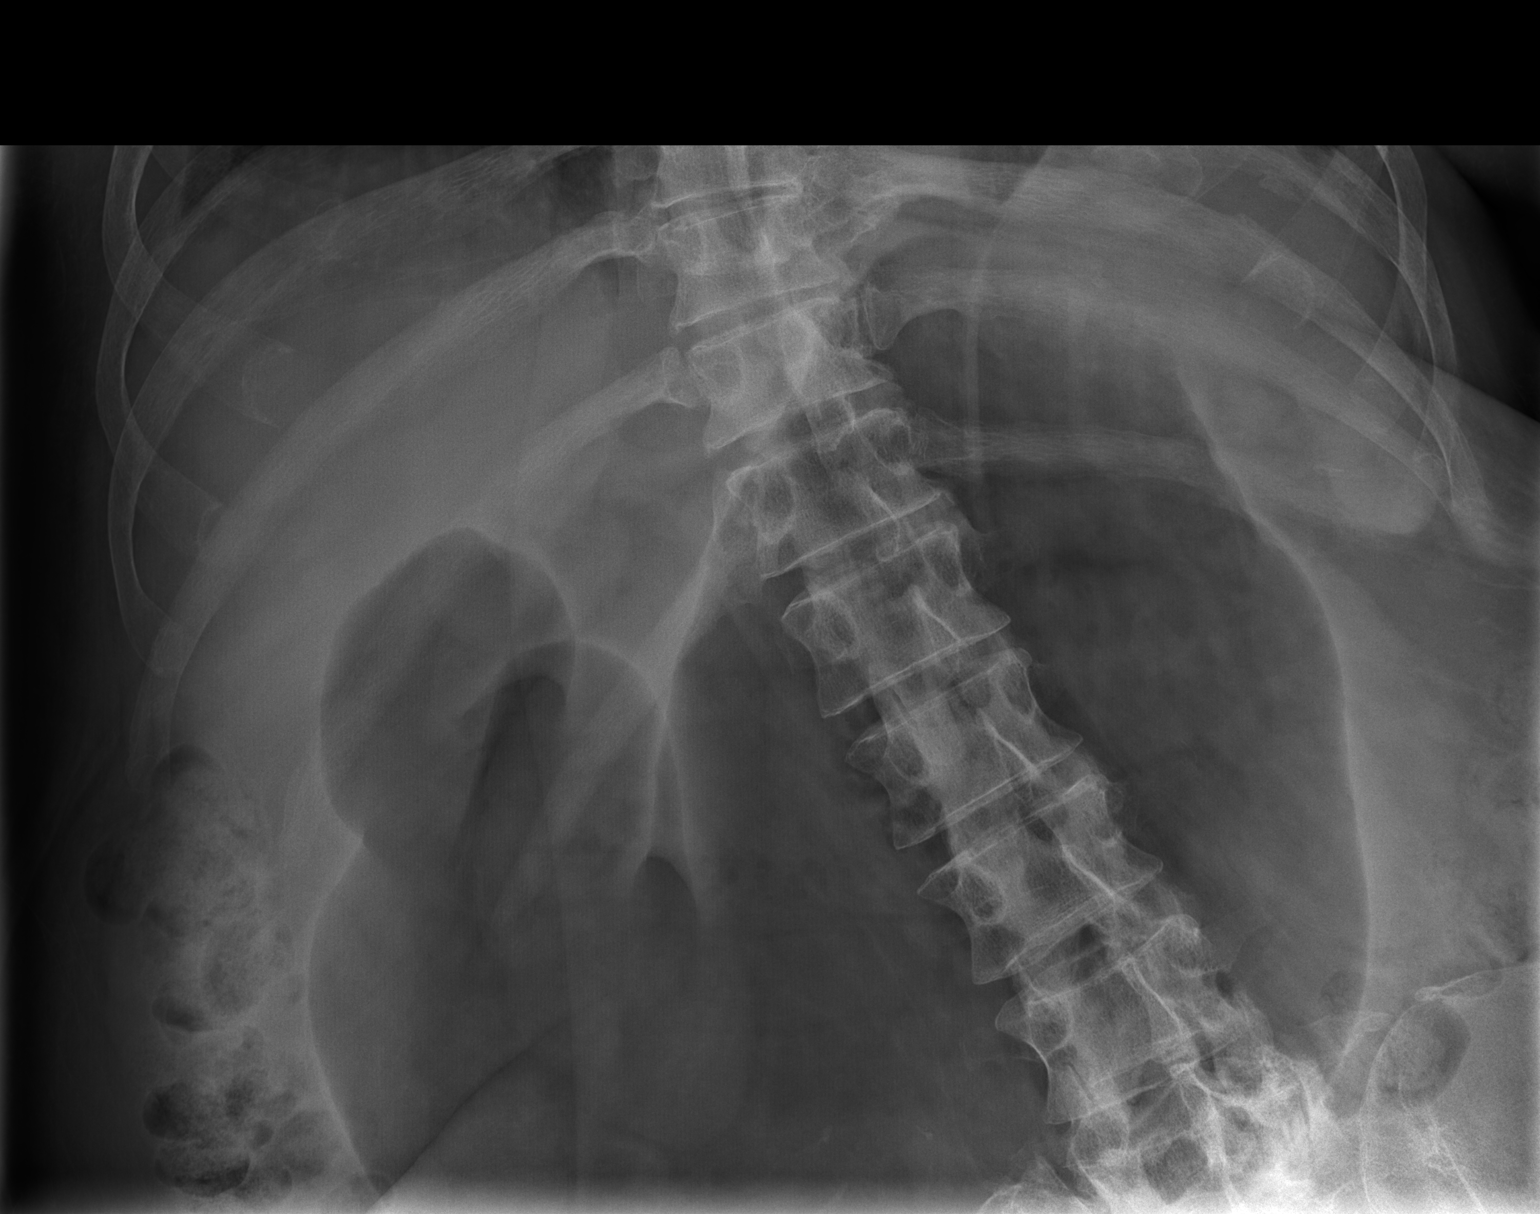

[t abdomen supine (2 of 3)]
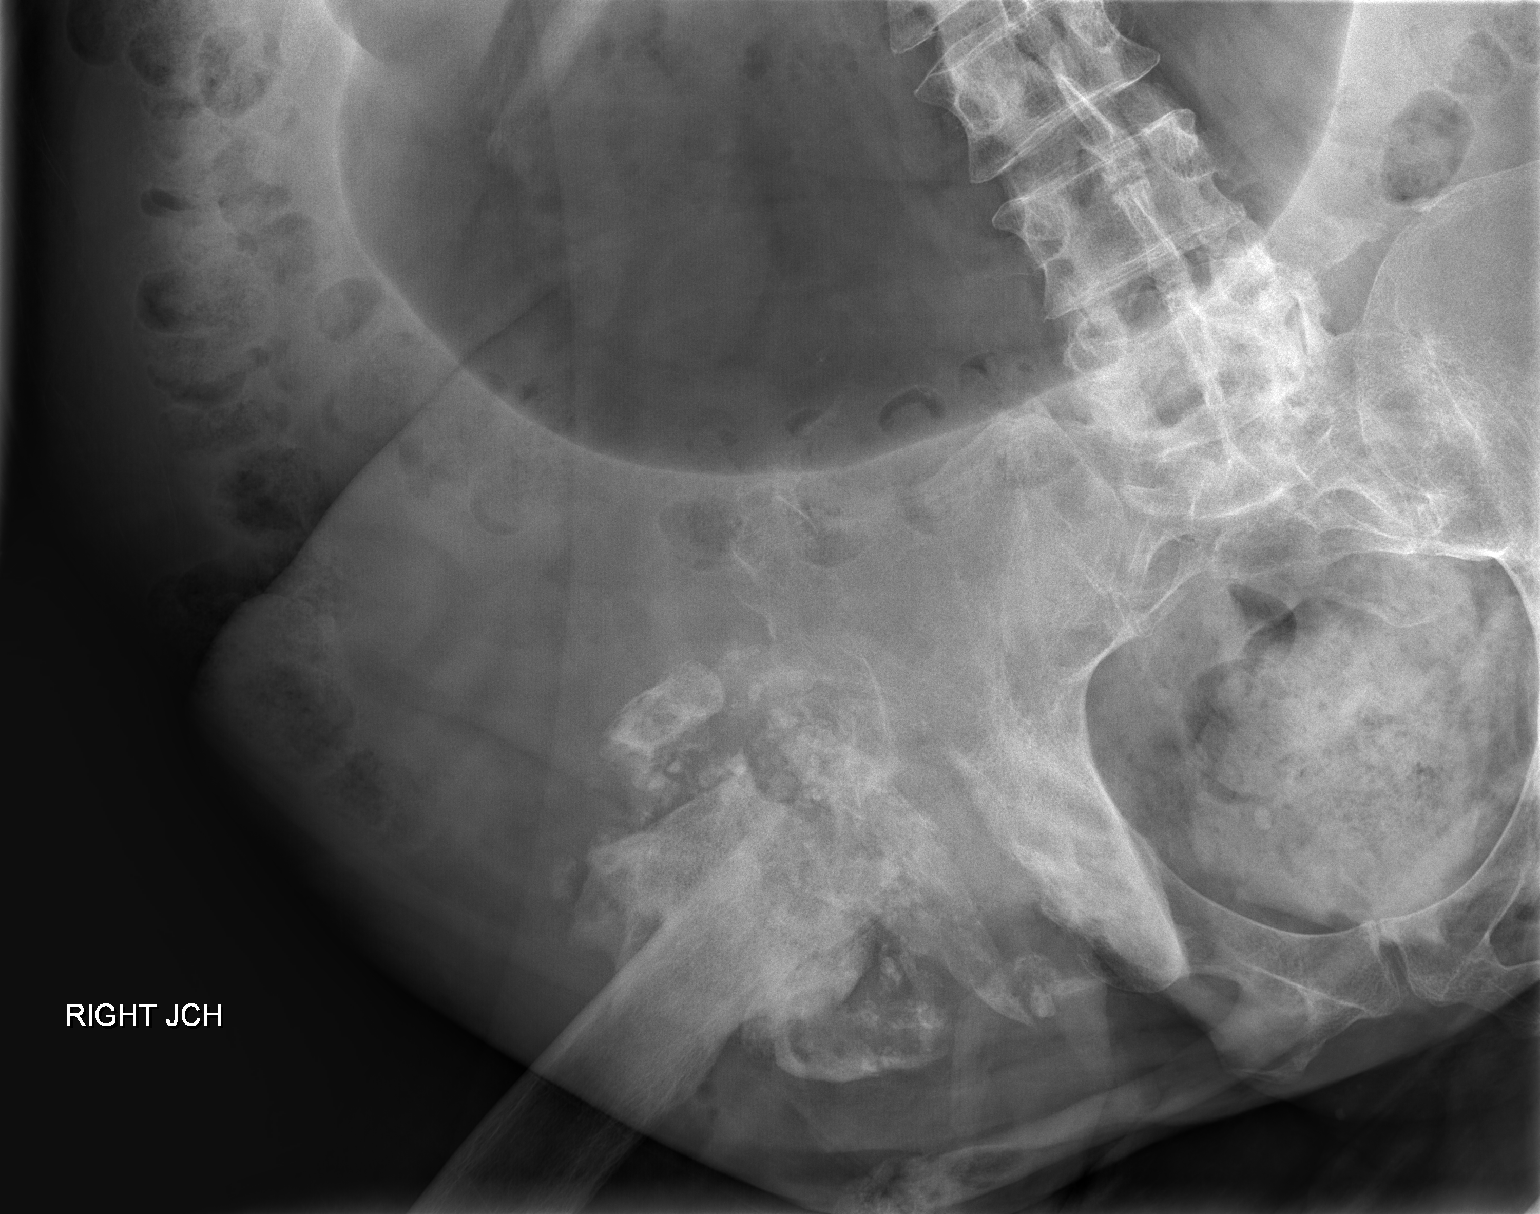

[t abdomen supine (3 of 3)]
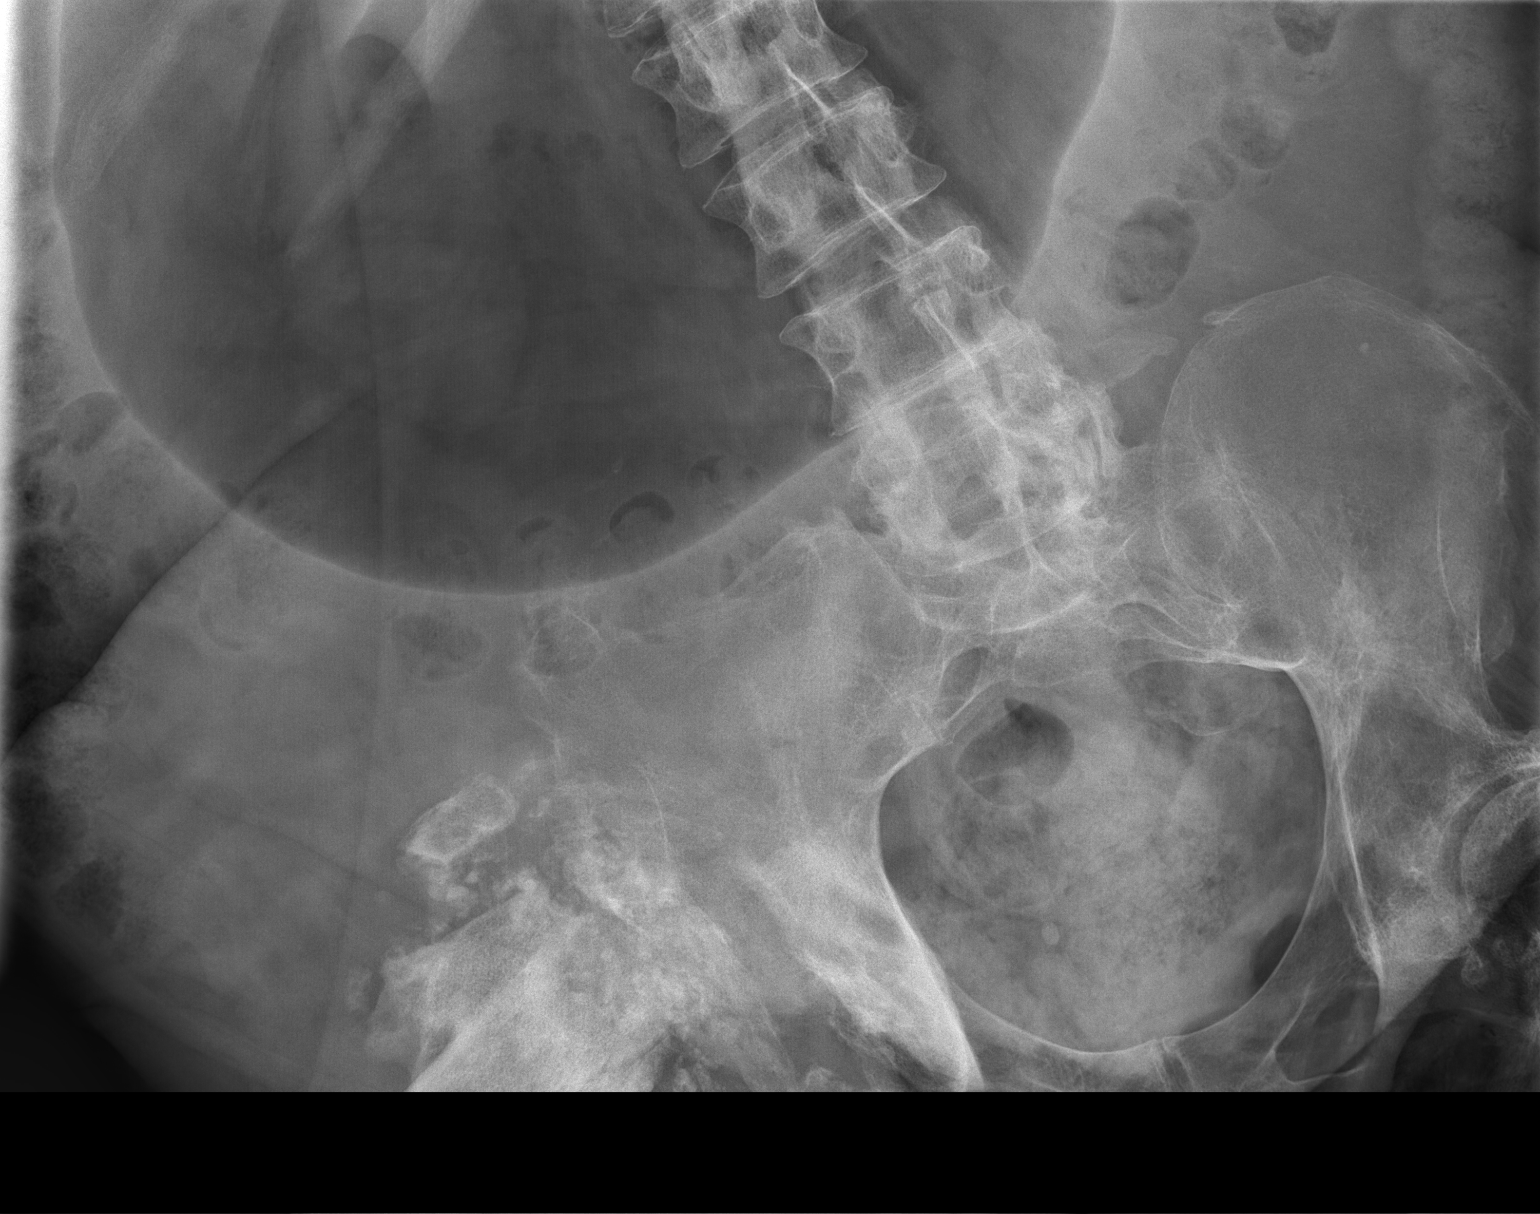

[3 of 3 positions shown; findings below may reference images not displayed]

FINDINGS: There is massive gaseous distention of the stomach to the
approximate level of the duodenal bulb.  Remote fracture deformity
with probable neuropathic change proximal right femur and pelvis,
and changed.  No small or large bowel dilatation.  No free air
accounting for supine technique.
IMPRESSION: Massive gastric gaseous distention which could reflect gastric
outlet obstruction, gastroparesis, or ingested air less likely.

## 2014-12-22 NOTE — Progress Notes (Signed)
Subjective:    Patient ID: [name] [name], male    DOB: 10-23-1965, 49 y.o.   MRN: [id number]  HPI 49 y.o. M with quadraplegia (C5 fracture 1988) and nonhealing sacral wound for the past 4-5 years.  August 02, 2011 with right hip osteomyelitis. aspirate grew methicillin sensitive coagulase-negative staph and Candida. [name])  June 25 to July 2. Bone biopsy grew Enterobacter and he was started on ertapenem again received 8 weeks of therapy completing that on September 1st. 02-20-13 CT to have an abscess 6.8 x 3.6 x 5.9 cm with gas in hip joint, left side, with no changes in chronic right sacral decub and air tracking to right hip joint. He had urine and blood cultures drawn and placed on vancomycin and imipenem. His UCx grew > 100k E cloacae (S-aminoglycosides, FLQ, Bactrim) for which he received fosfomycin.  His abscess was aspirated by IR on 02-22-13 and grew E coli (R flq and bactrim). He was treated with IV ceftriaxone and oral flagyl 06-21-13 he was admitted to the hospital with fever. He was started on vanco/zosyn. He underwent MRI: overall appearance is consistent with chronic osteomyelitis with the fluid collections concerning for abscesses. He underwent IR drain of his fluid collection, Cx was (-). He had UCx which grew Providencia, Pseudomonas. 09-24-13 with worsening decubitus drainage. UCx (Providencia), Wound C (staph aureus).  12-21-13 he came to [facility] with increasing wound d/c and foul urine. He was found to have new abscess near R femur, ileum. He was taken to OR by Dr [name] and had debridement. His OR Cx grew A baumanii (S- colistin, tygacil) and Enterococcus (S- vanco, amp). His UCx grew P. Aeruginosa (S- gent, imipenem, tobra). He also had wound on his R shoulder, prev wound vac.  08-28-14 with new R groin ulcer which was felt to be attributed to his foley catheter leaking. He underwent debridement of this. His g/s was polymicrobial and his Cx grew pseudmonas. He was started on  cipro/flagyl.   He has been eval at [facility] for possible muscle flap. He was schedule to undergo this and was started on anbx to try to sterilize his wound bed as much as possible prior to the procedure. He was started on vanco/merrem on 6-16. By 7-19, we were notified that his surgery had been canceled. His anbx were stopped and his PIC was ordered to be pulled by his home health agency.  He returns to the hospital 7-24 with hypotension and temp 101.8. WBC was normal.  CT scan of abd/pelvis did not show abscess or infection.  He was started on vanco/zosyn. He is now noted to have 2/2 BCx and ultimately grew MRSE.  His UCx grew Pseudomonas and Providencia.  He was to receive vanco for 2 weeks, 1 week zosyn. He was d/c home on 8-1. Vanco end date 8-8.  His course has since been complicated by having heat stroke after his housing lost [facility].  He has been told by [facility] that he would need removal of his LE and pelvis to cure his decubitus. This surgery has been declined.  He still getting home wound care. States his wounds are at their baseline per his home health care.  No problems with his PIC line.  No problems with his ostomy.  Has new housing, [place] bus [place]".   Review of Systems  Constitutional: Negative for fever and chills.  see above, otherwise 12 points negative.      Objective:   Physical Exam  Constitutional: He appears well-developed and well-nourished.  HENT:  Mouth/Throat: No oropharyngeal exudate.  Eyes: EOM are normal. Pupils are equal, round, and reactive to light.  Neck: Neck supple.  Cardiovascular: Normal rate, regular rhythm and normal heart sounds.   Pulmonary/Chest: Effort normal and breath sounds normal.  Abdominal: Soft. Bowel sounds are normal. There is no tenderness. There is no rebound.  Musculoskeletal:  LUE PIC is clean, non-tender, no d/c.   Lymphadenopathy:    He has no cervical adenopathy.      Assessment & Plan:

## 2014-12-22 NOTE — Progress Notes (Signed)
RN received verbal order to discontinue the patient's PICC line.  Patient identified with name and date of birth. PICC dressing removed, site unremarkable.  Sutures removed without difficulty.  PICC line removed using sterile procedure @ 1240. PICC length equal to that noted in patient's hospital chart of 51 cm. Sterile petroleum gauze + sterile 4X4 applied to PICC site, pressure applied for 10 minutes and covered with Medipore tape as a pressure dressing. Patient tolerated procedure without complaints.  Patient instructed to limit use of arm for 1 hour. Patient instructed that the pressure dressing should remain in place for 24 hours. Patient verbalized understanding of these instructions.

## 2014-12-22 NOTE — Assessment & Plan Note (Signed)
has been treated (colonized?). Will continue to watch.

## 2014-12-22 NOTE — Assessment & Plan Note (Signed)
Has completed his therapy.  Will stop his anbx, pull his PIC.  Consider repeat BCx at his f/u visit.

## 2014-12-22 NOTE — Addendum Note (Signed)
Addended by: Lorne Skeens D on: 12/22/2014 12:44 PM   Modules accepted: Orders

## 2014-12-22 NOTE — Assessment & Plan Note (Signed)
Appears to be at baseline.  If [name] is not an option, could consider [facility] or [facility]? Appreciate Dr Leafy Ro f/u

## 2014-12-22 NOTE — Assessment & Plan Note (Signed)
Appreciate Dr Leafy Ro f/u.  See above Will see him back in 3 months.

## 2014-12-23 IMAGING — CR DG ABDOMEN 1V
2 series · 2 of 2 positions shown · non-contrast
Comparison: Yesterday

CLINICAL DATA: Nausea

ABDOMEN - 1 VIEW

[t abdomen supine (1 of 2)]
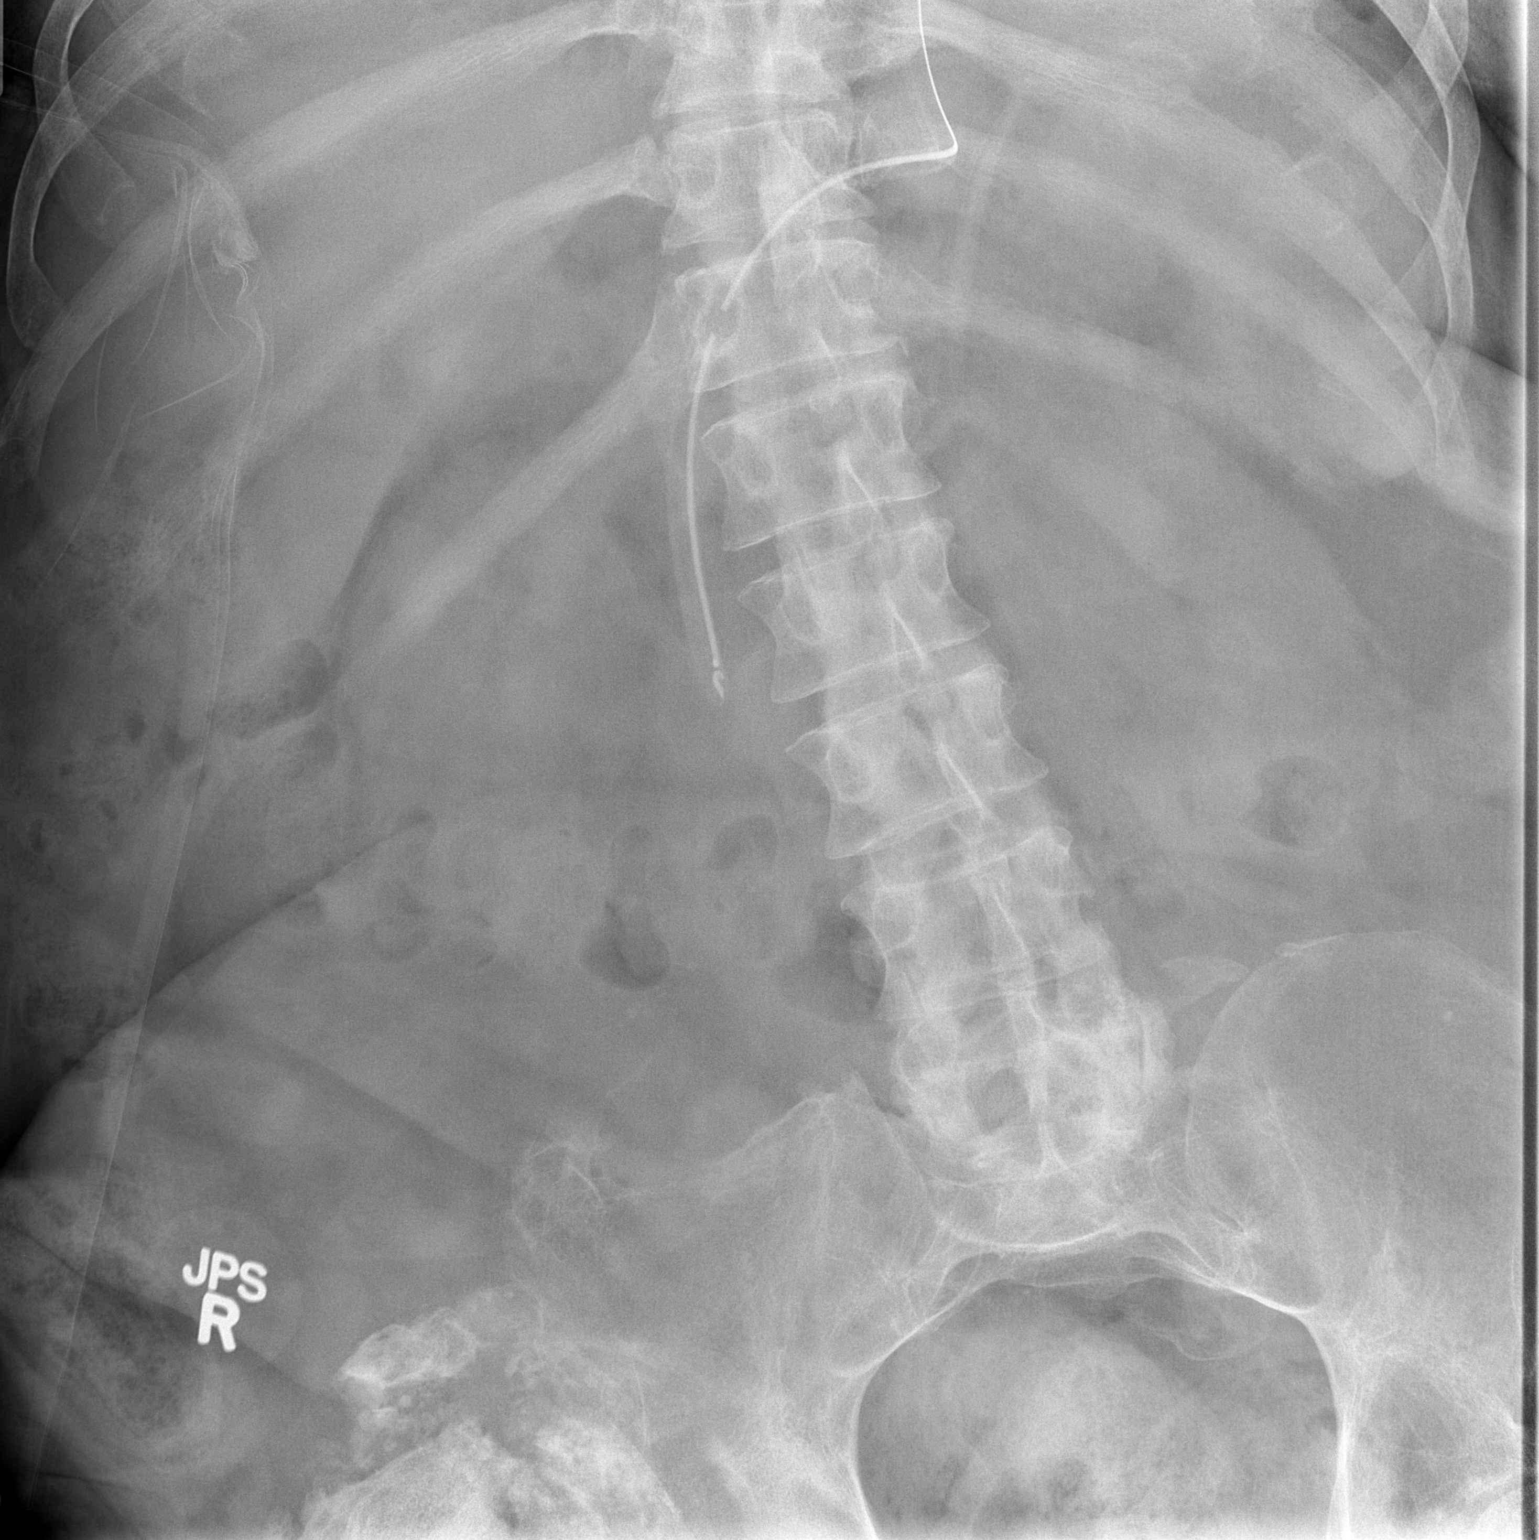

[t abdomen supine (2 of 2)]
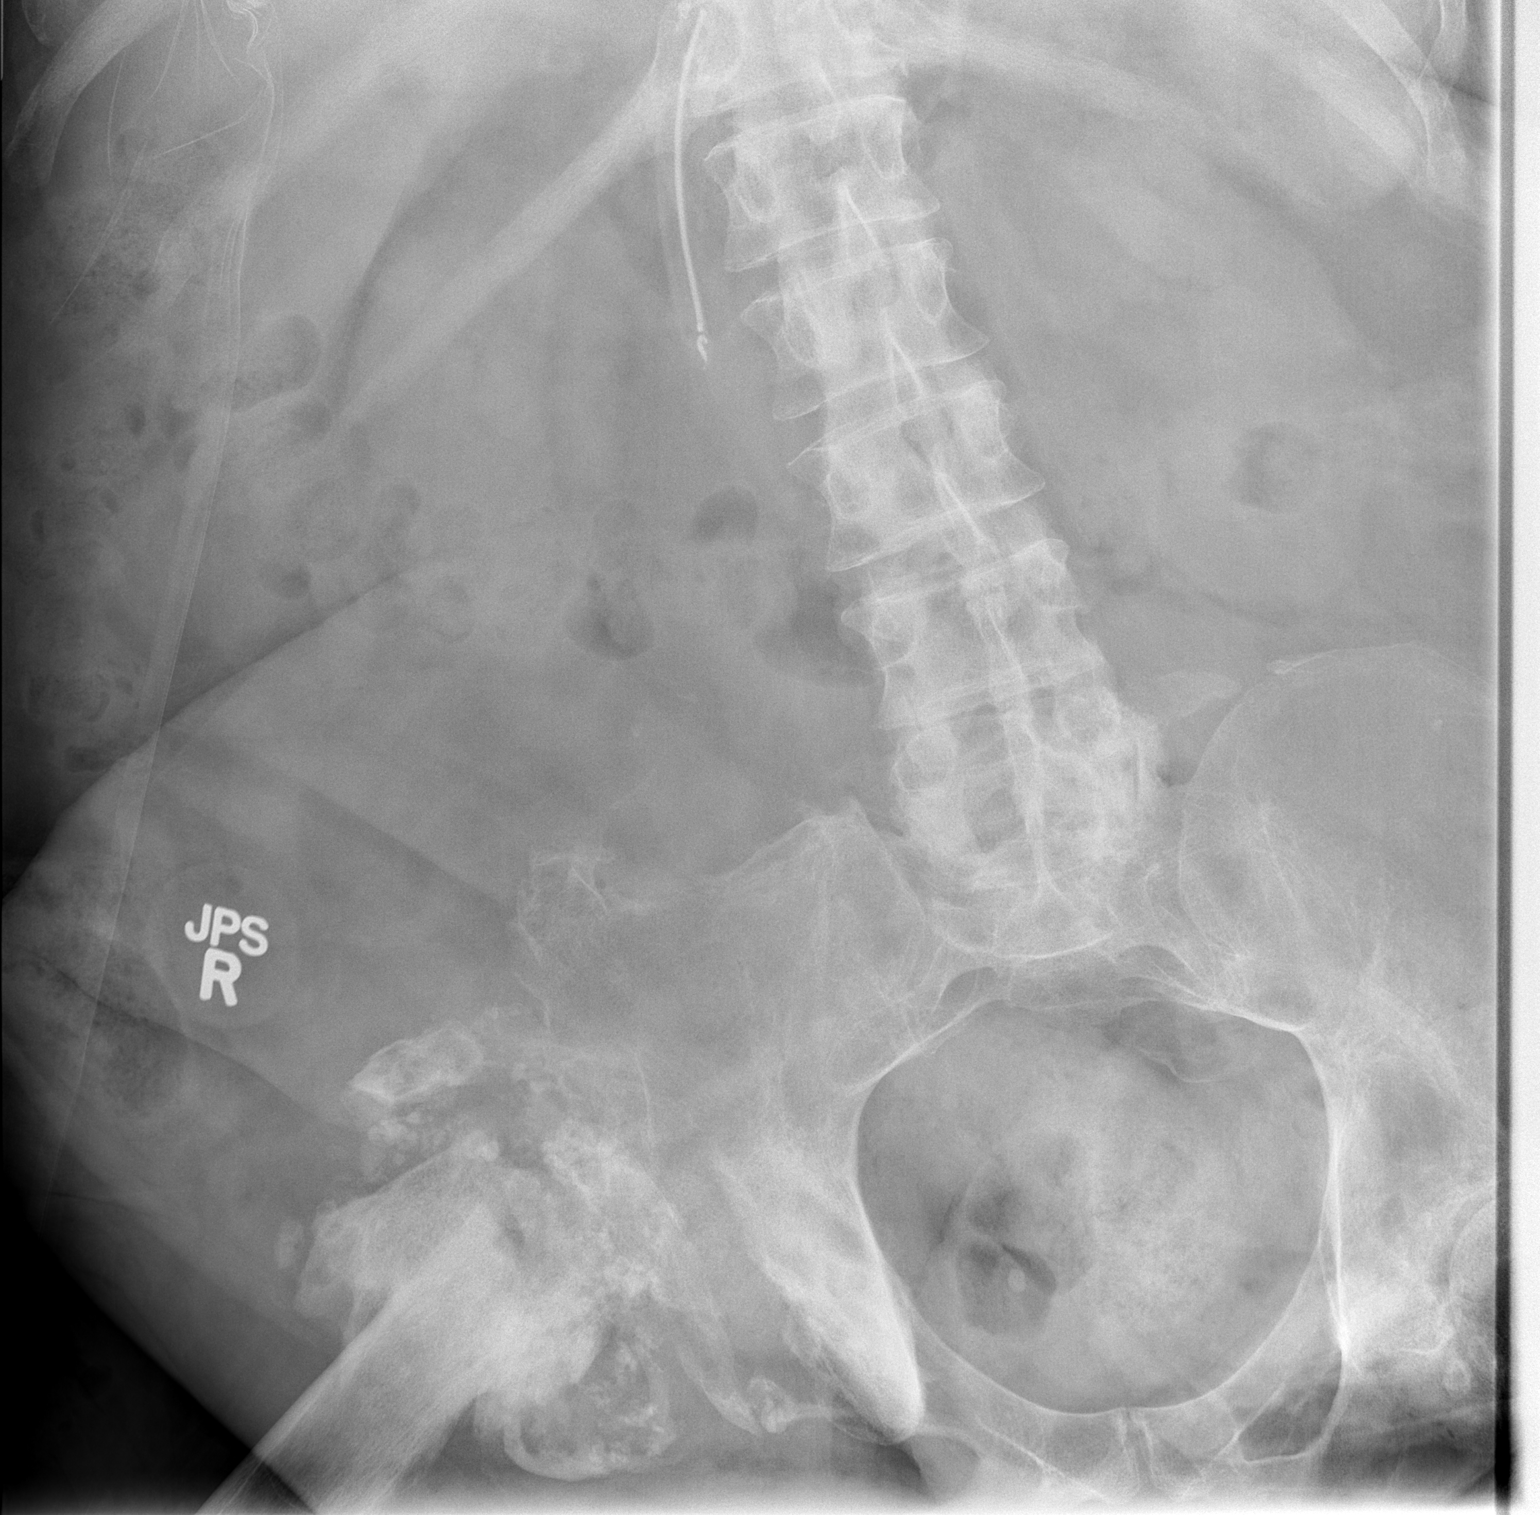

[2 of 2 positions shown; findings below may reference images not displayed]

FINDINGS: NG tube placed.  Gastric distention has resolved.  No
disproportionate dilatation of bowel. Chronic dislocation of the
right hip joint with severe neuropathic deformity of the proximal
right femur and right hemi pelvis.
IMPRESSION: Gastric distention resolved after NG tube placement.

## 2014-12-24 IMAGING — CR DG ABDOMEN 1V
3 series · 3 of 3 positions shown · non-contrast
Comparison: 05/10/2012

CLINICAL DATA: Ileus

ABDOMEN - 1 VIEW

[t abdomen supine (1 of 3)]
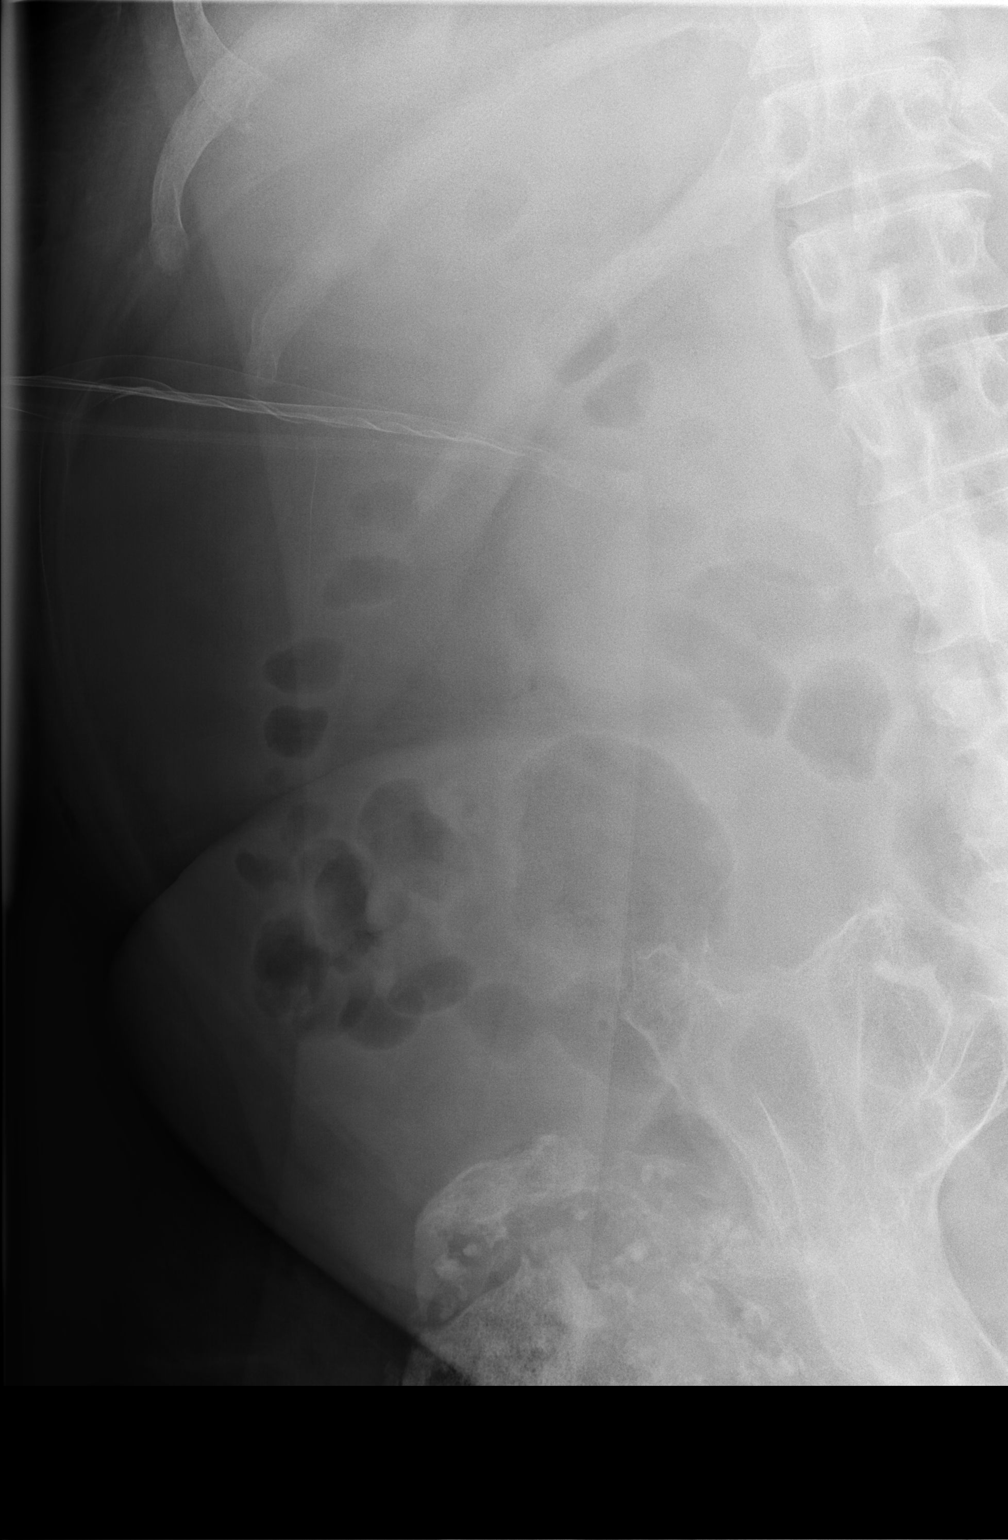

[t abdomen supine (2 of 3)]
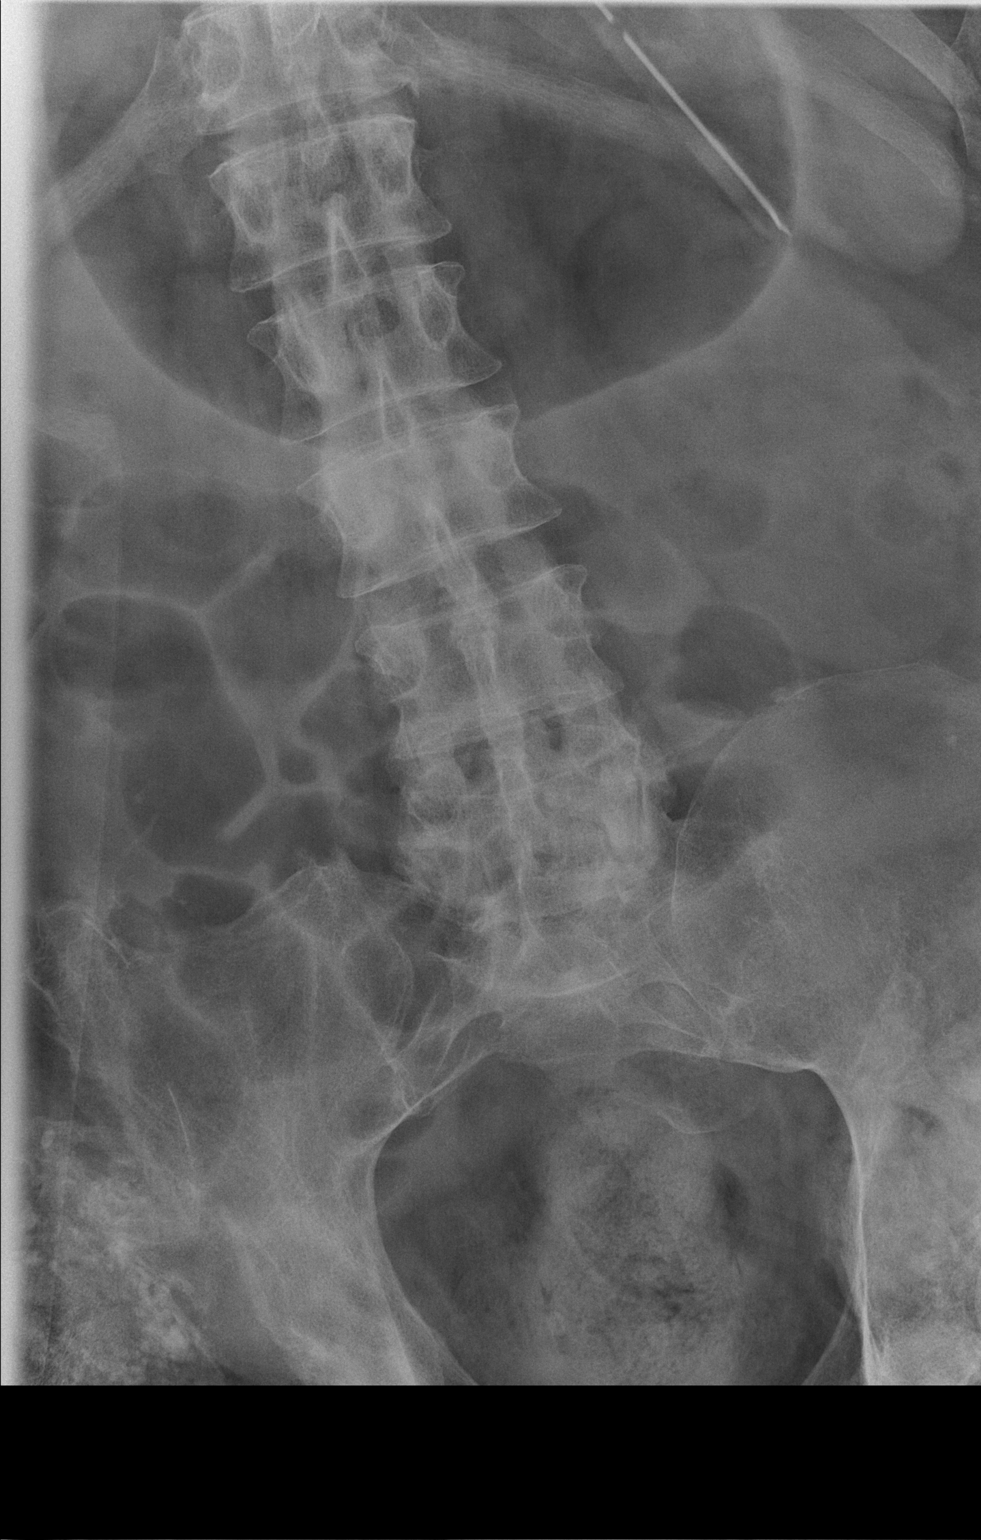

[t abdomen supine (3 of 3)]
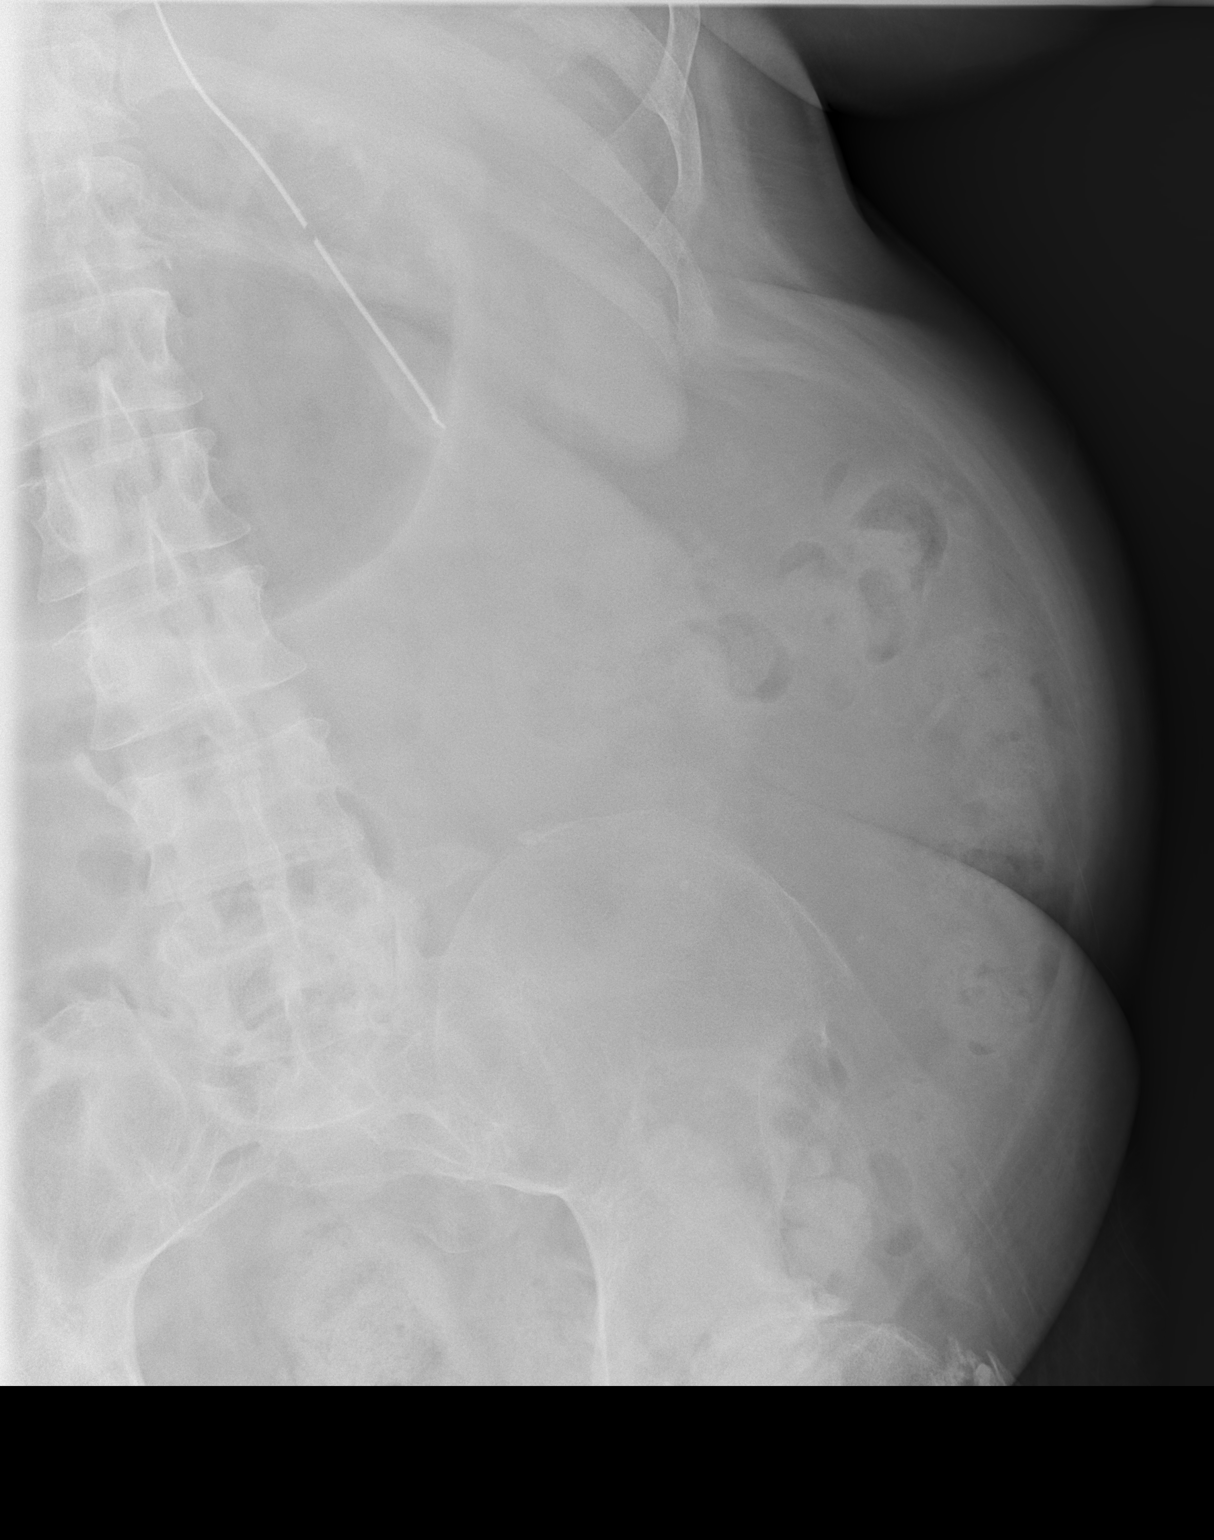

[3 of 3 positions shown; findings below may reference images not displayed]

FINDINGS: Tube tip remains in the stomach.  The stomach is
distended.  Gas filled loops of small and large bowel are present
without disproportionate dilatation.  Pelvic deformity is again
noted.
IMPRESSION: Gastric distention has developed despite NG tube position in the
stomach. Low wall suction may be helpful.

No evidence of small bowel obstruction or small bowel ileus.

## 2014-12-25 ENCOUNTER — Telehealth: Payer: Self-pay

## 2014-12-25 NOTE — Telephone Encounter (Signed)
lvm rx will be faxed to [facility]

## 2014-12-25 NOTE — Telephone Encounter (Signed)
Faxed order for lab draw to [facility]   [phone].

## 2014-12-25 NOTE — Telephone Encounter (Signed)
Pt called stating Dr Burr Medico wrote a Rx for his home health person to draw his blood. He has lost the Rx. Per Dr Ernestina Penna OV note this was for CBC/diff/retic. Please rewrite Rx and fax to [facility] at [phone]

## 2014-12-25 NOTE — Telephone Encounter (Signed)
Will fax the order on prescription pad  Truitt Merle

## 2014-12-26 IMAGING — CR DG ABD PORTABLE 1V
2 series · 2 of 2 positions shown · non-contrast
Comparison: Abdominal radiographs, the most recent of which was
performed 05/11/2012

CLINICAL DATA: Vomiting and abdominal distension.

PORTABLE ABDOMEN - 1 VIEW

[AP (1 of 2)]
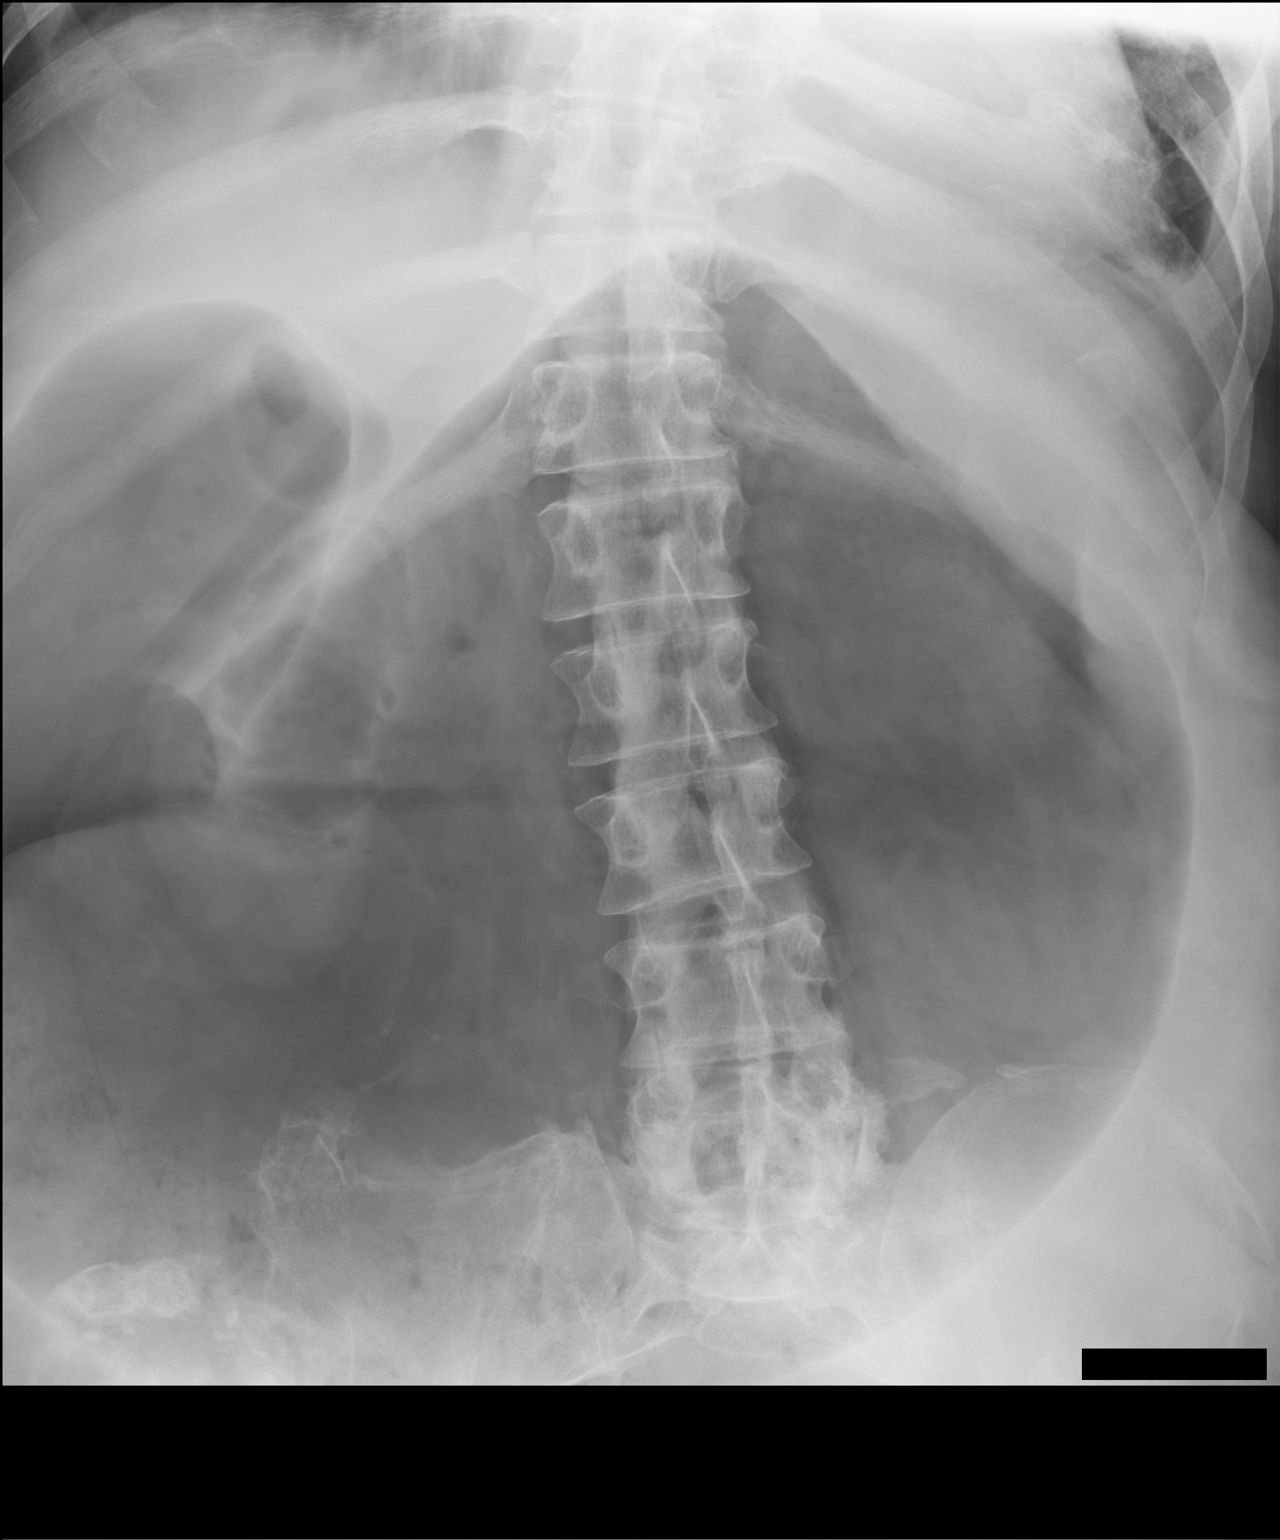

[AP (2 of 2)]
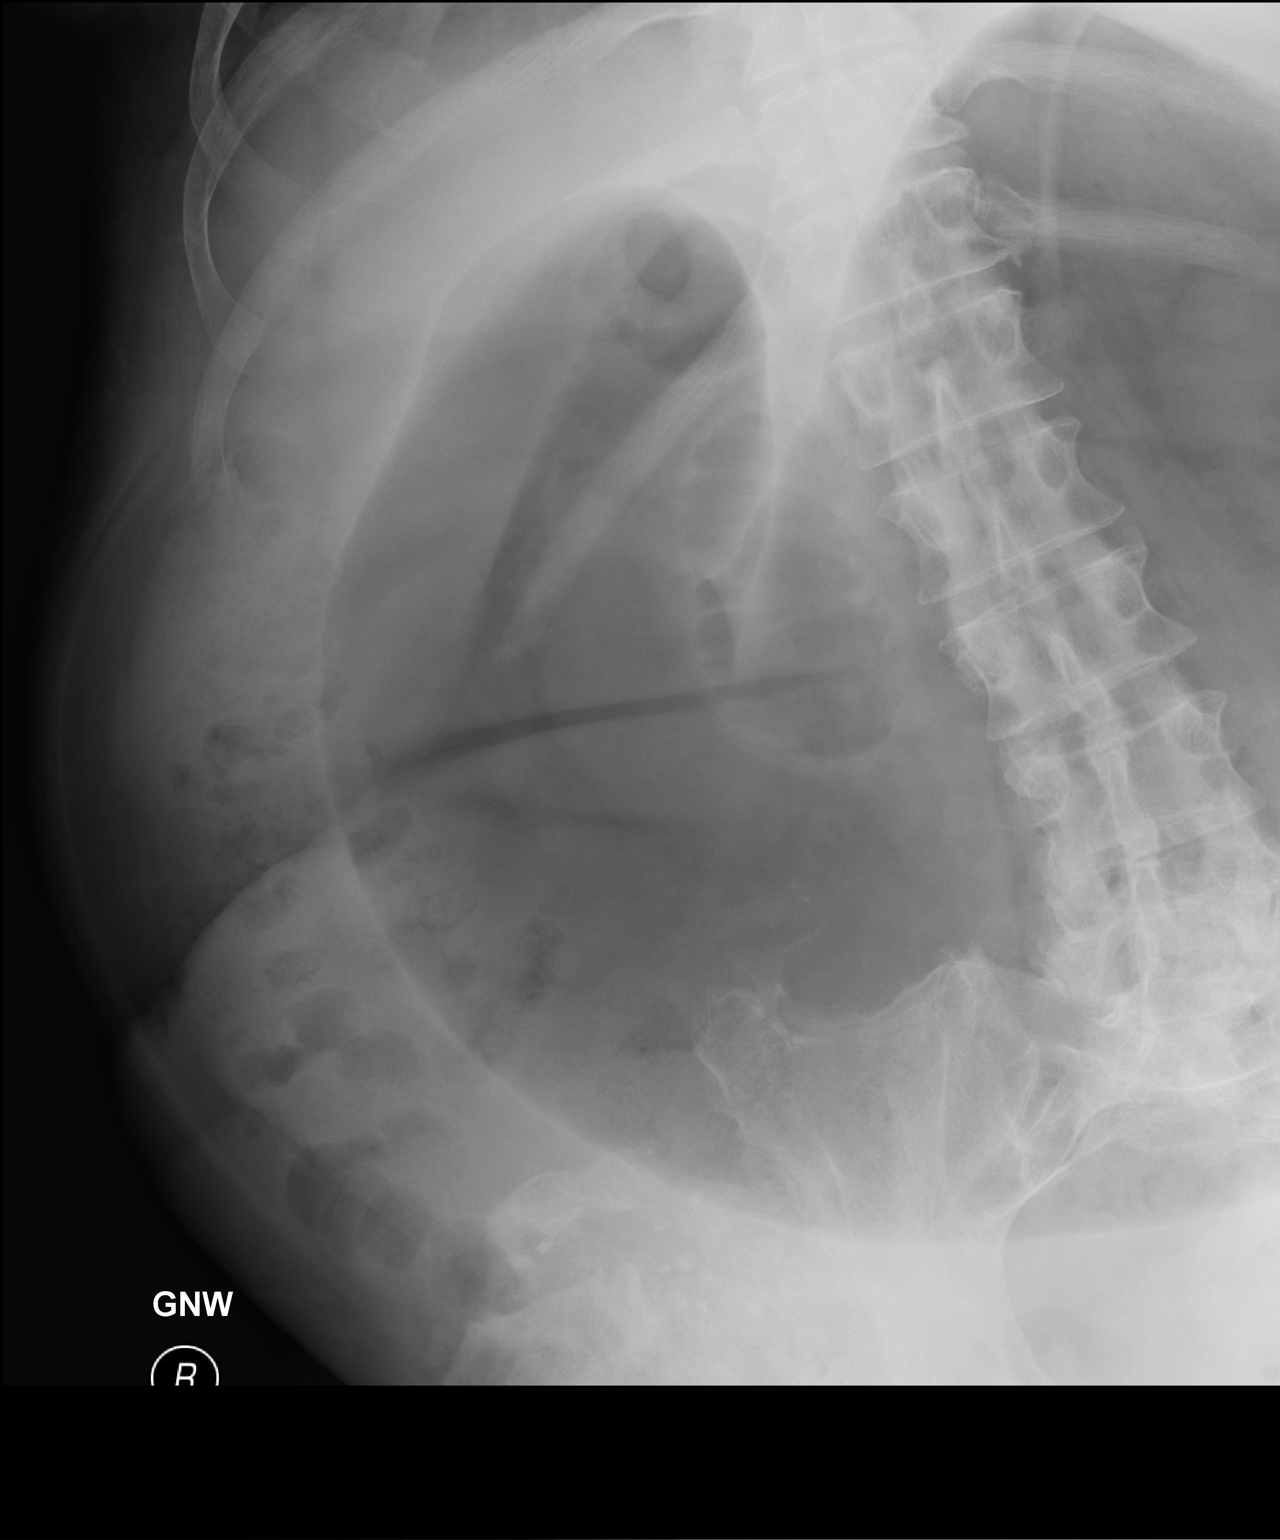

[2 of 2 positions shown; findings below may reference images not displayed]

FINDINGS: There is recurrent massive distension of the stomach,
following removal of the patient's enteric tube.  As significant
air is seen within the first and second segment of the duodenum,
this may reflect gastroparesis or less likely obstruction at the
level of the duodenum.

The visualized bowel gas pattern is otherwise grossly unremarkable.
No free intra-abdominal air is identified, though evaluation is
limited on a supine views.  No acute osseous abnormalities are
seen.  A small left pleural effusion is seen; mild bibasilar
opacities likely reflect atelectasis.
IMPRESSION: 1.  Recurrent massive distension of the stomach, following removal
of the patient's enteric tube.  As significant air is seen within
the proximal duodenum, this may reflect gastroparesis, or less
likely obstruction at the third segment of the duodenum.  Recommend
immediate placement of enteric tube.
2.  Small left pleural effusion seen; mild bibasilar opacities
likely reflect atelectasis.

to Nursing on [REDACTED], who verbally acknowledged these results.

## 2014-12-28 ENCOUNTER — Encounter (HOSPITAL_COMMUNITY): Payer: Self-pay | Admitting: Nurse Practitioner

## 2014-12-28 ENCOUNTER — Emergency Department (HOSPITAL_COMMUNITY)
Admission: EM | Admit: 2014-12-28 | Discharge: 2014-12-29 | Disposition: A | Discharge status: Home / Self Care | Payer: Medicare Other | Attending: Emergency Medicine | Admitting: Emergency Medicine

## 2014-12-28 DIAGNOSIS — K625 Hemorrhage of anus and rectum: Secondary | ICD-10-CM | POA: Diagnosis not present

## 2014-12-28 DIAGNOSIS — Z8739 Personal history of other diseases of the musculoskeletal system and connective tissue: Secondary | ICD-10-CM | POA: Diagnosis not present

## 2014-12-28 DIAGNOSIS — K219 Gastro-esophageal reflux disease without esophagitis: Secondary | ICD-10-CM | POA: Diagnosis not present

## 2014-12-28 DIAGNOSIS — G4733 Obstructive sleep apnea (adult) (pediatric): Secondary | ICD-10-CM | POA: Diagnosis not present

## 2014-12-28 DIAGNOSIS — Z872 Personal history of diseases of the skin and subcutaneous tissue: Secondary | ICD-10-CM | POA: Insufficient documentation

## 2014-12-28 DIAGNOSIS — Z87440 Personal history of urinary (tract) infections: Secondary | ICD-10-CM | POA: Diagnosis not present

## 2014-12-28 DIAGNOSIS — Z8701 Personal history of pneumonia (recurrent): Secondary | ICD-10-CM | POA: Diagnosis not present

## 2014-12-28 DIAGNOSIS — Z8709 Personal history of other diseases of the respiratory system: Secondary | ICD-10-CM | POA: Diagnosis not present

## 2014-12-28 DIAGNOSIS — Z79899 Other long term (current) drug therapy: Secondary | ICD-10-CM | POA: Diagnosis not present

## 2014-12-28 DIAGNOSIS — Z8669 Personal history of other diseases of the nervous system and sense organs: Secondary | ICD-10-CM | POA: Insufficient documentation

## 2014-12-28 DIAGNOSIS — Z8619 Personal history of other infectious and parasitic diseases: Secondary | ICD-10-CM | POA: Insufficient documentation

## 2014-12-28 DIAGNOSIS — D649 Anemia, unspecified: Secondary | ICD-10-CM | POA: Insufficient documentation

## 2014-12-28 DIAGNOSIS — I10 Essential (primary) hypertension: Secondary | ICD-10-CM | POA: Insufficient documentation

## 2014-12-28 DIAGNOSIS — R112 Nausea with vomiting, unspecified: Secondary | ICD-10-CM | POA: Diagnosis present

## 2014-12-28 DIAGNOSIS — Z9981 Dependence on supplemental oxygen: Secondary | ICD-10-CM | POA: Insufficient documentation

## 2014-12-28 LAB — I-STAT CHEM 8, ED
BUN: 33 mg/dL — ABNORMAL HIGH (ref 6–20)
CALCIUM ION: 1.21 mmol/L (ref 1.12–1.23)
CHLORIDE: 102 mmol/L (ref 101–111)
CREATININE: 0.7 mg/dL (ref 0.61–1.24)
GLUCOSE: 111 mg/dL — AB (ref 65–99)
HCT: 33 % — ABNORMAL LOW (ref 39.0–52.0)
Hemoglobin: 11.2 g/dL — ABNORMAL LOW (ref 13.0–17.0)
POTASSIUM: 4.2 mmol/L (ref 3.5–5.1)
SODIUM: 135 mmol/L (ref 135–145)
TCO2: 21 mmol/L (ref 0–100)

## 2014-12-28 LAB — POC OCCULT BLOOD, ED: Fecal Occult Bld: POSITIVE — AB

## 2014-12-28 MED ORDER — METOCLOPRAMIDE HCL 10 MG PO TABS
10.0000 mg | ORAL_TABLET | Freq: Three times a day (TID) | ORAL | Status: DC
  Administered 2014-12-29 (×3): 10 mg via ORAL
  Filled 2014-12-28 (×3): qty 1

## 2014-12-28 MED ORDER — FERROUS SULFATE 325 (65 FE) MG PO TABS
325.0000 mg | ORAL_TABLET | Freq: Three times a day (TID) | ORAL | Status: DC
  Administered 2014-12-29 (×3): 325 mg via ORAL
  Filled 2014-12-28 (×4): qty 1

## 2014-12-28 MED ORDER — DOCUSATE SODIUM 100 MG PO CAPS
100.0000 mg | ORAL_CAPSULE | Freq: Two times a day (BID) | ORAL | Status: DC | PRN
  Administered 2014-12-29: 100 mg via ORAL
  Filled 2014-12-28: qty 1

## 2014-12-28 MED ORDER — FUROSEMIDE 40 MG PO TABS
20.0000 mg | ORAL_TABLET | Freq: Every day | ORAL | Status: DC
  Administered 2014-12-29: 20 mg via ORAL
  Filled 2014-12-28: qty 1

## 2014-12-28 MED ORDER — PANTOPRAZOLE SODIUM 40 MG PO TBEC
40.0000 mg | DELAYED_RELEASE_TABLET | Freq: Two times a day (BID) | ORAL | Status: DC
  Administered 2014-12-29 (×2): 40 mg via ORAL
  Filled 2014-12-28 (×2): qty 1

## 2014-12-28 MED ORDER — ADULT MULTIVITAMIN W/MINERALS CH
1.0000 | ORAL_TABLET | Freq: Every morning | ORAL | Status: DC
  Administered 2014-12-29: 1 via ORAL
  Filled 2014-12-28: qty 1

## 2014-12-28 MED ORDER — POTASSIUM CHLORIDE CRYS ER 20 MEQ PO TBCR: 40.0000 meq | EXTENDED_RELEASE_TABLET | Freq: Every day | ORAL | Status: DC | PRN

## 2014-12-28 MED ORDER — AMLODIPINE BESYLATE 2.5 MG PO TABS
2.5000 mg | ORAL_TABLET | Freq: Every day | ORAL | Status: DC
  Administered 2014-12-29 (×2): 2.5 mg via ORAL
  Filled 2014-12-28 (×2): qty 1

## 2014-12-28 MED ORDER — GI COCKTAIL ~~LOC~~
30.0000 mL | Freq: Once | ORAL | Status: AC
  Administered 2014-12-28: 30 mL via ORAL
  Filled 2014-12-28: qty 30

## 2014-12-28 MED ORDER — JUVEN PO PACK
1.0000 | PACK | Freq: Two times a day (BID) | ORAL | Status: DC
  Filled 2014-12-28 (×2): qty 1

## 2014-12-28 MED ORDER — RANITIDINE HCL 15 MG/ML PO SYRP
75.0000 mg | ORAL_SOLUTION | Freq: Once | ORAL | Status: AC
  Administered 2014-12-28: 75 mg via ORAL
  Filled 2014-12-28: qty 5

## 2014-12-28 MED ORDER — BACLOFEN 20 MG PO TABS
20.0000 mg | ORAL_TABLET | Freq: Four times a day (QID) | ORAL | Status: DC
  Administered 2014-12-29 (×4): 20 mg via ORAL
  Filled 2014-12-28 (×5): qty 1

## 2014-12-28 MED ORDER — SUCRALFATE 1 G PO TABS
1.0000 g | ORAL_TABLET | Freq: Four times a day (QID) | ORAL | Status: DC
  Administered 2014-12-29 (×4): 1 g via ORAL
  Filled 2014-12-28 (×4): qty 1
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
## 2014-12-28 NOTE — ED Notes (Signed)
Bed: HQ30 Expected date: 12/28/14 Expected time: 6:02 PM Means of arrival: Ambulance Comments: N/V BEDBUGS

## 2014-12-28 NOTE — ED Notes (Signed)
Patient came into ED today from home d/t N/V/D x 2 days and bloody stools. He also c/o some minor discomfort in abdomen

## 2014-12-29 NOTE — Clinical Social Work Note (Signed)
Clinical Social Work Assessment  Patient Details  Name: [name] [name] MRN: [id number] Date of Birth: 16-Dec-1965  Date of referral:  12/29/14               Reason for consult:  Housing Concerns/Homelessness                Permission sought to share information with:  Case Manager, Other (caregiver, [name] [name] ) Permission granted to share information::  Yes, Verbal Permission Granted  Name::     [name] [name]   Agency::  [facility]  Relationship::  Caregiver/live in   Contact Information:  [phone]  Housing/Transportation Living arrangements for the past 2 months:    Source of Information:  Patient, Other (Comment Required) (care giver) Patient Interpreter Needed:  None Criminal Activity/Legal Involvement Pertinent to Current Situation/Hospitalization:    Significant Relationships:  [employer] Lives with:  Self, Other (Comment) (caregiver) Do you feel safe going back to the place where you live?  Yes Need for family participation in patient care:  No (Coment)  Care giving concerns:  Patient came in after bed bugs found on medical air mattress   Social Worker assessment / plan: CSW met with pt at bedside. Pt shared that he lives at home with his caregiver who provides 24/7 care. Patient states he sleeps on a medical air mattress from [facility] and bed bugs were found in Velcro and fabric pieces of air mattress, especially near the head of the bed. Patient shares that his caregiver helped clean the mattress and got the mattress outside. They called [facility] but they have not returned the call. Earlier in the week when the bed needed repair, they were told by [facility], pt now owns the bed and any other repairs or needs would be out of pocket. Patient is unsure if he can get a new bed. Patient gave CSW permission to call pt caregiver, [name] [name]. CSW spoke with Mrs. [name] who states that wound care doctor was looking into patient changing his bed to a sand bed  to help with wounds but they have no heard anything specific. Per caregiver, pt to have follow up wound care appointment some time today and is calling to clarify appointment.  Pt family are able to buy a new air mattress but not a medical air mattress out of pocket.     Employment status:  Disabled (Comment on whether or not currently receiving Disability) Insurance information:  Managed Medicare PT Recommendations:  Not assessed at this time Information / Referral to community resources:     Patient/Family's Response to care: Pt and caregiver are appreciative of any support and assistance with new medical air mattress  Patient/Family's Understanding of and Emotional Response to Diagnosis, Current Treatment, and Prognosis:  Patient and patietn caregiver aware that insurance may or may not be able to assist with a new mattress.  Emotional Assessment Appearance:  Appears stated age Attitude/Demeanor/Rapport:   (calm and coopeartive) Affect (typically observed):  Accepting Orientation:  Oriented to Self, Oriented to Place, Oriented to Situation, Oriented to  Time Alcohol / Substance use:   Psych involvement (Current and /or in the community):  No (Comment)  Discharge Needs  Concerns to be addressed:  Discharge Planning Concerns Readmission within the last 30 days:  No Current discharge risk:  None Barriers to Discharge:  Equipment Delay   [name] [name], [place], LCSW 12/29/2014, 8:39 AM

## 2014-12-29 NOTE — Progress Notes (Signed)
Pt pending returning home with [facility] and equipment vs. snf placement for wound care pending fumigation of home so that pt can receive new equipment.   MD consulted wound care.   CSW spoke with ED secretary, wound care paged 2x, and trying for 3rd time.   Belia Heman, [place] Work  [employer] [phone]

## 2014-12-29 NOTE — ED Notes (Addendum)
Pt received phone call and has hoyer lift coming in 2 - 5 days.

## 2014-12-29 NOTE — ED Notes (Signed)
Bed cannot be delivered until house is exterminated again. [name] [name] Case manager present to speak to pt and family.

## 2014-12-29 NOTE — Consult Note (Signed)
WOC wound consult note Reason for Consult: Well known to [facility] team.  Nonhealing full thickness ulcers, present on admission.  Asking WOC team to complete dressing changes since he missed wound care for today.  Wound type:Chronic full thickness pressure ulcers Pressure Ulcer POA: Yes Measurement: Sacrum: 3cm x 5cm x 0.2cm, clean, pink, non-granulating. Stage 4 Coccyx: 8cm x 37m x 0.2cm, clean, pink, granulating. Stage 4 Right IT: 2.5cm x 3cm x 6cm. Dark red wound base, moist, non-granulating. Stage 4 Right groin: 4cm x 6cm x 0.2cm. Red, moist wound bed, non-granulating. Stage 4 Left IT: 3cm x 3cm x 5cm Red, moist wound bed, non-granulating. Stage 4 Wound bed:100% pale pink non granulating Drainage (amount, consistency, odor) Moderate serosanguinous  Musty odor Periwound:Scarring from previous ulcers Dressing procedure/placement/frequency:NS dressings applied to existing wounds.  Colostomy pouch intact  No further needs at this time.    Will not follow at this time.  Please re-consult if needed.  Domenic Moras RN BSN [place] Pager [phone]

## 2014-12-29 NOTE — Care Management Note (Addendum)
Case Management Note  Patient Details  Name: [name] [name] MRN: [id number] Date of Birth: 1965/12/14  Subjective/Objective:           49 yr old male [place] resident covered by [employer] states his hoyer lift pad disposed of "last night" Pt states he uses [facility] to for his air mattress and [facility] for his hoyer lift.  [facility] ED director to assist with hoyer lift pad    Action/Plan:  CM received message from ED SW Cm spoke with pt. Spoke with [name] [name] about pt DME and orders needed Spoke with [name] [name] of [facility] DME dept about replacing pt hoyer lift pad and how to order in EPIC.   [name] [name] entered orders in [facility] for air mattress and hoyer lift pad.  Updated ED SW.  Spoke with ED charge RN, ED CNA and ED director about hoyer lift pad.  Left a message for [name] [name] of [facility] about replacement of hoyer lift pad.   [place] spoke with [name] [name] at [facility] wound clinic to see if it was ok for pt to be delayed with his 1030 appt.  [name] [name] confirms pt can be taken to clinic from [facility] ED.  Reported pt generally came to ED in his electric w/c but pt does not have it today in [facility] ED so he can be brought in stretcher.  Cm had ED RN updated via nursing headset 1037 CM called [facility] and spoke with [name] [name] and [name] [name] ([phone] or [phone]) about process to get pt a new air mattress. [name] [name] recalls receiving a call "earl;ier this morning about this" [name] [name] states he needs information from the home health agency about the wound progress and a receipt for the home extermination faxed to 1 [phone] to assist pt to qualify for an upgrade in his air mattress 1100 CM spoke with [name] [name] of [facility]) about pt home health services and need for home health to fax information to [facility] to assist with an upgraded air mattress processing for pt. [name] [name] agreed to have [facility] assist. Confirms pt was receiving only a HHRN for  wound care.   1120 spoke with pt, ED RN, and male support system about need to have home exterminated prior to any agency coming to the home to delivery DME Provided [name] [name] fax number so if there is a receipt it can be faxed Cm had to spell out a lot of information for male and repeat things for understanding  1150 Cm spoke with [name] [name] and [name] [name] at [facility] wound care clinic about pt delay in getting to wound center.  [name] [name] recommended rescheduling appt to Monday January 05 2015 at 0930 this was done   1215 Pt unable to get to wound care center prior to 1200 staff left and stretcher to large for elevator Return to [facility] ED. CM and ED SW spoke pt about his options Pt states he does not prefer facility but not sure when mattress will arrive to home  1240 left another message for [facility], [name] [name] about hoyer lift pad  1246 spoke with ED SW about hoyer life pad and pending call from [facility] DME staff 1324 CM spoke with [name] [name] Pt's caregiver at [phone] per pt request She states pt is welcome to return to her home or to go to snf It is his choice She reports having the home exterminated last Tuesday and found bugs in  pt velcro of his mattress 12/28/14 and will not be returning the mattress to the home. Reports that the original mattress for the [facility] bed is on the pt's bed at this time.  Reports concerns with response from [facility] and unable to offer average time for deliveries from them.  Reports the home will be treated again on next week,  Reports her husband faxed the receipts to [facility] Cm gave her [name] [name] toll free number [phone] ext [phone] and his fax number [phone] to make sure it was faxed.  Reports the hoyer lift pad that was at [facility] was one she had purchased from someone out of pocket and the original one is torn/frail at the home.  [name] [name] to call and speak with pt  1337 Spoke with [name] [name] about [name] [name] lift Cm to inquire about the model/make of pt hoyer 89 Cm contacted  by [facility] DME coordinator stating pt able to get hoyer lift pad via his insurance coverage without issues.  Reports pt should be able to get pad today. Cm updated ED director, SW and ED RN 1430 CM and ED SW spoke with pt who agreed on d/c home with DME and [facility] services RN/SW to assist with other needs 1510 Left ED director a message about brace after ED SW mentioned  1553 CM contacted by [facility] DME coordinator after checking for hoyer lift pad at retail store to be picked up by caregiver- not able to obtain at [place] when pt was called earlier by a male 2-3 days is the disclaimer but lift pad should be delivered by [employer] by 12/30/14- ED SW & pt updated Pt states he is ok with this.   [place] spoke with [name] [name] caregiver about pt d/c home, hoyer life pad delivery, wound care nurse saw pt. [name] [name] states she has a blow up mattress she could put on pt bed if he requests but she believes the original mattress should be ok until new mattress received from [facility] She is ok with hoyer life pad.  Noted wound care RN notes in [place] ED RN updated CM spoke with [name] [name]  Expected Discharge Date:   12/29/14               Expected Discharge Plan:    home with DME and home services In-House Referral:   ED SW   Discharge planning Services   home with DME and home services  Post Acute Care Choice:   home with DME and home services Choice offered to:   pt   DME Arranged:   hoyer lift pad DME Agency:   [facility]  DME Arranged: air mattress   DME Agency:   [facility]   HH Arranged:   [place], RN, Aide [place] Agency:   [facility]   Status of Service:   completion   Additional Comments:  [name] Lis, RN 12/29/2014, 9:47 AM

## 2014-12-29 NOTE — ED Notes (Signed)
WOUND CARE RN IN ROOM WITNESSED BY FAITH MARIE. WOUND CARE/ WOUND DRESSING COMPLETE PERFORMED. PT CAN BE DISCHARGED.

## 2014-12-29 NOTE — Progress Notes (Signed)
CSW consulted with RN CM regarding medical air mattress.   Belia Heman, [place] Work  [employer] [phone]

## 2014-12-29 NOTE — ED Notes (Signed)
Left message on social workers confidential line about patient is still in the [place] due to needing social work(the reason why was left also).

## 2014-12-29 NOTE — Progress Notes (Signed)
CSW received consult for patient, regarding bed bugs and current living condition. CSW to assess patient and identify appropriate resources.  [name] [name], [place] Work  [employer] [phone]

## 2014-12-29 NOTE — ED Notes (Signed)
RN LM for Kim with Case Management to clarify status of Pts mattress.

## 2014-12-30 ENCOUNTER — Encounter: Payer: Self-pay | Admitting: Infectious Diseases

## 2014-12-30 IMAGING — CR DG ABDOMEN 2V
3 series · 3 of 3 positions shown · non-contrast
Comparison: 05/13/2012

CLINICAL DATA: Follow up of ileus with gastric distention.

ABDOMEN - 2 VIEW

[w abdomen decub (1 of 2)]
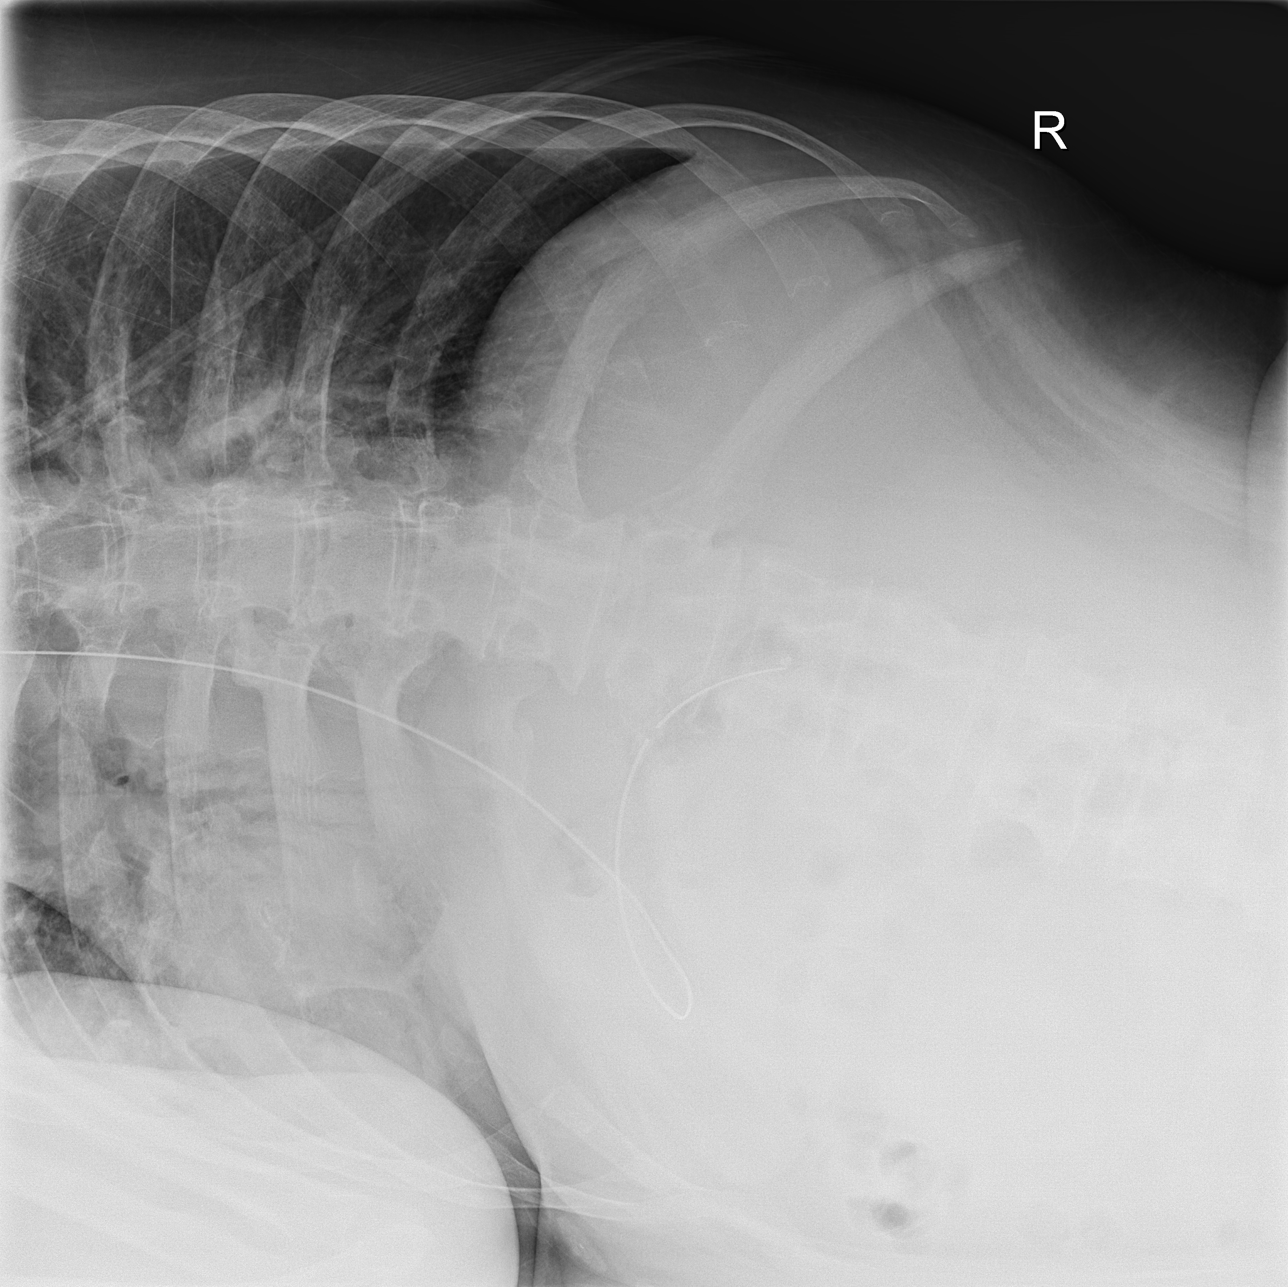

[w abdomen decub (2 of 2)]
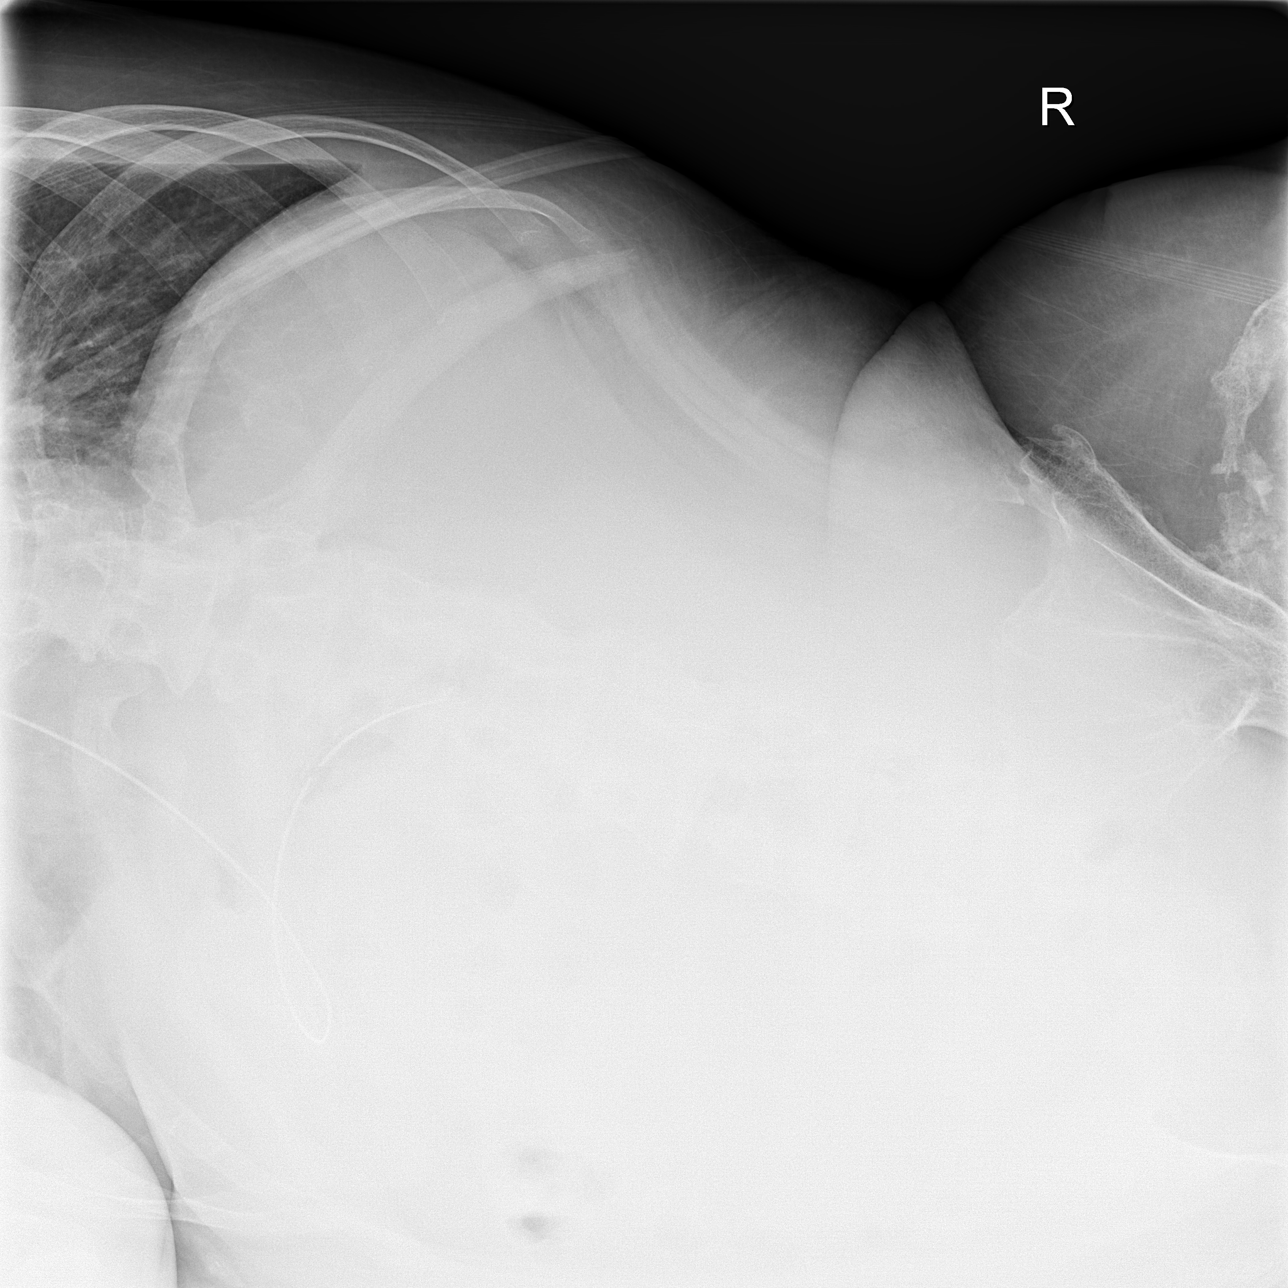

[x abdomen supine]
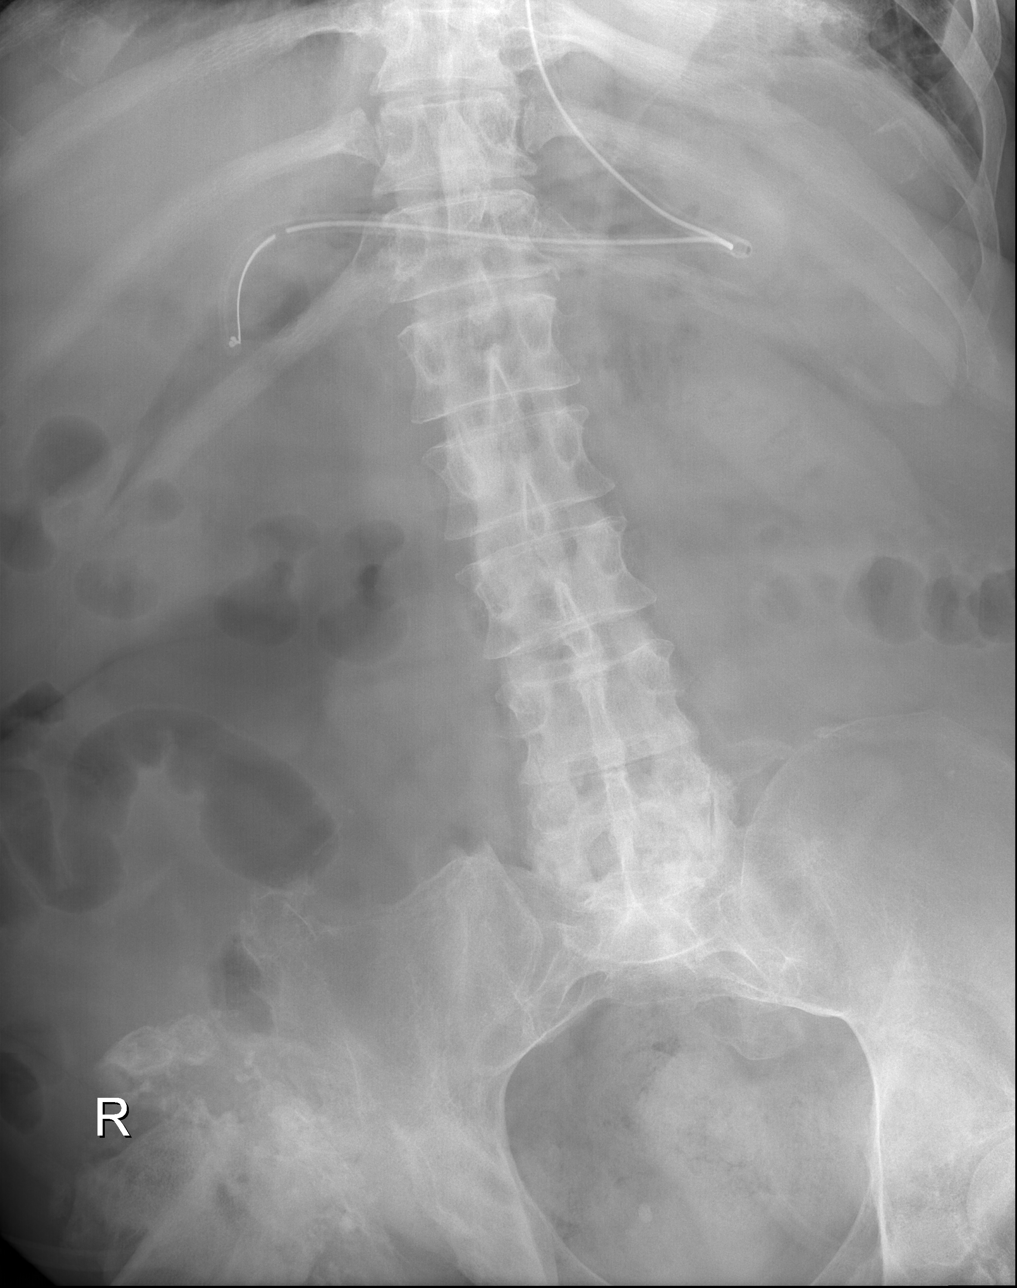

[3 of 3 positions shown; findings below may reference images not displayed]

FINDINGS: 2 right side up decubitus views.  These demonstrate no
free intraperitoneal air or significant air fluid levels.  There is
trace right-sided pleural fluid or thickening.

The supine view demonstrates placement of a nasogastric tube, which
terminates in the antropyloric region.  Resolution of gastric
distention.  Nonspecific small bowel dilatation within the right
side of the abdomen up to 3.9 cm.  Colonic gas also identified.
Distal stool.

Osseous fragmentation about the right acetabulum is suboptimally
evaluated.
IMPRESSION: Placement of a nasogastric tube with resolution of gastric
distention.

Nonspecific mild small bowel dilatation in the right side of the
abdomen.

## 2015-01-06 DIAGNOSIS — L89153 Pressure ulcer of sacral region, stage 3: Secondary | ICD-10-CM | POA: Diagnosis not present

## 2015-01-06 DIAGNOSIS — X58XXXD Exposure to other specified factors, subsequent encounter: Secondary | ICD-10-CM | POA: Diagnosis not present

## 2015-01-06 DIAGNOSIS — L89154 Pressure ulcer of sacral region, stage 4: Secondary | ICD-10-CM | POA: Diagnosis present

## 2015-01-06 DIAGNOSIS — M8610 Other acute osteomyelitis, unspecified site: Secondary | ICD-10-CM | POA: Diagnosis not present

## 2015-01-06 DIAGNOSIS — S31109D Unspecified open wound of abdominal wall, unspecified quadrant without penetration into peritoneal cavity, subsequent encounter: Secondary | ICD-10-CM | POA: Diagnosis not present

## 2015-01-12 ENCOUNTER — Telehealth: Payer: Self-pay | Admitting: *Deleted

## 2015-01-12 NOTE — Telephone Encounter (Signed)
Report from [facility] RN.  [place] RN assessed the pt at home visit this afternoon.  Wound without significant odor, temp 99.4, respiratory effort normal, breath sounds clear, urine clear, slight usual sediment, slight dry cough.  [facility] RN reinforced that if temperature increases, urinary output decreases or any other signs or symptoms of infection occur that the pt should go to the [facility] ED.

## 2015-01-12 NOTE — Telephone Encounter (Signed)
Please have him come in if anbx felt to be needed thanks

## 2015-01-12 NOTE — Telephone Encounter (Signed)
Unable to provide appt in the next 2 weeks.  RN left message for the pt.  RN advised pt to go to [place] for evaluation if he is  able to obtain an appt. RN advised pt that if he is having a chills/fever, decreased urine output, increased urine odor, increased pus-like  drainage from wound or any other symptoms of infection he is advised to go to the [facility] ED for evaluation.    RN spoke with the [facility] RN going out to the pt's home this afternoon.  She is going to evaluate the pt and call [facility] back this afternoon.

## 2015-01-12 NOTE — Telephone Encounter (Signed)
Pt was told by [facility] RN last week, sacral wound has "smell."  Pt has left a message at Dr. Leafy Ro office about the "smell."  Pt wondering if he should be placed on an antibiotic.  MD please advise.

## 2015-01-13 NOTE — Telephone Encounter (Signed)
Thanks  hold appt for now

## 2015-01-14 ENCOUNTER — Telehealth: Payer: Self-pay | Admitting: Infectious Diseases

## 2015-01-14 DIAGNOSIS — M4628 Osteomyelitis of vertebra, sacral and sacrococcygeal region: Secondary | ICD-10-CM

## 2015-01-14 MED ORDER — DOXYCYCLINE HYCLATE 100 MG PO TABS: 100.0000 mg | ORAL_TABLET | Freq: Two times a day (BID) | ORAL | Status: DC

## 2015-01-14 NOTE — Telephone Encounter (Signed)
Patient called to ask if there needed to be any additional precautions for the wound site or casual touch now that they know it is MRSA. Advised him gloves and proper hand washing and disposal of wound care materials. That he would need to make sure he washed any linens in hot water and just be careful and they should be fine.

## 2015-01-14 NOTE — Telephone Encounter (Signed)
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 (S- tet, vanco, rif, zyvox, clinda, gent, R- bactrim).  He states his care giver also noted odor to his wound.   Will call in rx for 3 months doxy.

## 2015-01-16 ENCOUNTER — Other Ambulatory Visit: Payer: Self-pay | Admitting: Gastroenterology

## 2015-01-17 NOTE — Addendum Note (Signed)
Addended byClarene Essex on: 01/17/2015 08:13 AM   Modules accepted: Orders

## 2015-01-20 ENCOUNTER — Encounter (HOSPITAL_COMMUNITY): Payer: Self-pay | Admitting: *Deleted

## 2015-01-20 NOTE — Progress Notes (Signed)
Pt denies any cardiac history or chest pain. Pt is a quadriplegic, will be arriving by [facility] bus in AM. States he does have caregivers at home who will be with him for 24 hours after procedure. 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 Pt requests that a [facility] lift be available for him to transfer from wheelchair to bed.

## 2015-01-21 ENCOUNTER — Ambulatory Visit (HOSPITAL_COMMUNITY)
Admission: RE | Admit: 2015-01-21 | Discharge: 2015-01-21 | Disposition: A | Discharge status: Home / Self Care | Payer: Medicare Other | Source: Ambulatory Visit | Attending: Gastroenterology | Admitting: Gastroenterology

## 2015-01-21 ENCOUNTER — Encounter (HOSPITAL_COMMUNITY)
Admission: RE | Disposition: A | Discharge status: Home / Self Care | Payer: Self-pay | Source: Ambulatory Visit | Attending: Gastroenterology

## 2015-01-21 ENCOUNTER — Ambulatory Visit (HOSPITAL_COMMUNITY): Payer: Medicare Other | Admitting: Anesthesiology

## 2015-01-21 ENCOUNTER — Encounter (HOSPITAL_COMMUNITY): Payer: Self-pay | Admitting: Gastroenterology

## 2015-01-21 DIAGNOSIS — K219 Gastro-esophageal reflux disease without esophagitis: Secondary | ICD-10-CM | POA: Insufficient documentation

## 2015-01-21 DIAGNOSIS — Z538 Procedure and treatment not carried out for other reasons: Secondary | ICD-10-CM | POA: Insufficient documentation

## 2015-01-21 SURGERY — CANCELLED PROCEDURE
Anesthesia: Monitor Anesthesia Care

## 2015-01-21 MED ORDER — SODIUM CHLORIDE 0.9 % IV SOLN: INTRAVENOUS | Status: DC

## 2015-01-21 MED ORDER — LACTATED RINGERS IV SOLN: INTRAVENOUS | Status: DC

## 2015-01-21 NOTE — Progress Notes (Signed)
[name] [name] 9:13 AM  Subjective: Patient doing well for him without any new problems since I saw him recently in the office  Objective: Vital signs stable afebrile no acute distress exam please see preassessment evaluation  Assessment: Chronic significant reflux with esophageal ulcers want to reevaluate  Plan: Okay to proceed with endoscopy today with anesthesia assistance  [facility]  Pager [phone] After 5PM or if no answer call [phone]

## 2015-01-21 NOTE — Anesthesia Preprocedure Evaluation (Addendum)
Anesthesia Evaluation  Patient identified by MRN, date of birth, ID band Patient awake    Reviewed: Allergy & Precautions, NPO status , Patient's Chart, lab work & pertinent test results  History of Anesthesia Complications Negative for: history of anesthetic complications  Airway Mallampati: III  TM Distance: >3 FB Neck ROM: Full    Dental no notable dental hx. (+) Dental Advisory Given, Teeth Intact   Pulmonary sleep apnea ,    Pulmonary exam normal breath sounds clear to auscultation       Cardiovascular Exercise Tolerance: Poor hypertension, + Peripheral Vascular Disease  (-) Past MI Normal cardiovascular exam Rhythm:Regular Rate:Normal     Neuro/Psych Seizures -,  PSYCHIATRIC DISORDERS Depression quadriplegia    GI/Hepatic Neg liver ROS, GERD  ,  Endo/Other  negative endocrine ROS  Renal/GU negative Renal ROS     Musculoskeletal   Abdominal   Peds  Hematology  (+) Blood dyscrasia, anemia ,   Anesthesia Other Findings   Reproductive/Obstetrics                            Anesthesia Physical  Anesthesia Plan  ASA: III  Anesthesia Plan: MAC   Post-op Pain Management:    Induction: Intravenous  Airway Management Planned:   Additional Equipment:   Intra-op Plan:   Post-operative Plan:   Informed Consent: I have reviewed the patients History and Physical, chart, labs and discussed the procedure including the risks, benefits and alternatives for the proposed anesthesia with the patient or authorized representative who has indicated his/her understanding and acceptance.   Dental advisory given  Plan Discussed with: CRNA and Anesthesiologist  Anesthesia Plan Comments:         Anesthesia Quick Evaluation

## 2015-01-21 NOTE — Progress Notes (Signed)
Unable to get a IV despite ultrasound and even central line placement by anesthesia team and multiple sticks and have canceled elective endoscopy for today and have recommended a Port-A-Cath for long-term use and then call me to set up repeat endoscopy one month after Port-A-Cath placement or when convenient and call me sooner when necessary and I've asked my nurse to call Dr. Baird Cancer his primary care doctor and let her know

## 2015-01-21 NOTE — Transfer of Care (Signed)
Immediate Anesthesia Transfer of Care Note  Patient: [name] [name]  Procedure(s) Performed: Procedure(s): ESOPHAGOGASTRODUODENOSCOPY (EGD) (N/A)  Patient Location: PACU and Endoscopy Unit  Anesthesia Type:MAC  Level of Consciousness: awake, alert  and oriented  Airway & Oxygen Therapy: Patient Spontanous Breathing  Post-op Assessment: Report given to RN and Post -op Vital signs reviewed and stable  Post vital signs: Reviewed and stable  Last Vitals:  Filed Vitals:   01/21/15 0922  BP: 100/68  Pulse: 116  Temp: 36.6 C  Resp: 12    Complications: No apparent anesthesia complications

## 2015-01-21 NOTE — Addendum Note (Signed)
Addendum  created 01/21/15 1207 by Scheryl Darter, CRNA   Modules edited: Anesthesia Events, Anesthesia Flowsheet, Narrator   Narrator:  Narrator: Event Log Edited

## 2015-01-26 ENCOUNTER — Encounter (HOSPITAL_BASED_OUTPATIENT_CLINIC_OR_DEPARTMENT_OTHER): Payer: Medicare Other | Attending: Plastic Surgery

## 2015-01-26 DIAGNOSIS — L89314 Pressure ulcer of right buttock, stage 4: Secondary | ICD-10-CM | POA: Insufficient documentation

## 2015-01-26 DIAGNOSIS — M869 Osteomyelitis, unspecified: Secondary | ICD-10-CM | POA: Diagnosis not present

## 2015-01-26 DIAGNOSIS — L89323 Pressure ulcer of left buttock, stage 3: Secondary | ICD-10-CM | POA: Insufficient documentation

## 2015-01-26 DIAGNOSIS — L89894 Pressure ulcer of other site, stage 4: Secondary | ICD-10-CM | POA: Insufficient documentation

## 2015-01-26 DIAGNOSIS — L89152 Pressure ulcer of sacral region, stage 2: Secondary | ICD-10-CM | POA: Diagnosis not present

## 2015-01-26 DIAGNOSIS — L89892 Pressure ulcer of other site, stage 2: Secondary | ICD-10-CM | POA: Diagnosis not present

## 2015-01-26 DIAGNOSIS — L89522 Pressure ulcer of left ankle, stage 2: Secondary | ICD-10-CM | POA: Diagnosis not present

## 2015-01-29 ENCOUNTER — Encounter: Payer: Medicare Other | Admitting: Hematology

## 2015-01-29 ENCOUNTER — Other Ambulatory Visit: Payer: Medicare Other

## 2015-01-29 ENCOUNTER — Ambulatory Visit: Payer: Medicare Other

## 2015-01-29 ENCOUNTER — Telehealth: Payer: Self-pay | Admitting: Hematology

## 2015-01-29 ENCOUNTER — Encounter: Payer: Self-pay | Admitting: Hematology

## 2015-01-29 NOTE — Telephone Encounter (Signed)
pt called to r/s appt...done....pt ok and aware °

## 2015-01-29 NOTE — Progress Notes (Signed)
This encounter was created in error - please disregard.

## 2015-02-02 ENCOUNTER — Ambulatory Visit (HOSPITAL_BASED_OUTPATIENT_CLINIC_OR_DEPARTMENT_OTHER): Payer: Medicare Other | Admitting: Hematology

## 2015-02-02 ENCOUNTER — Telehealth: Payer: Self-pay | Admitting: Hematology

## 2015-02-02 ENCOUNTER — Ambulatory Visit (HOSPITAL_BASED_OUTPATIENT_CLINIC_OR_DEPARTMENT_OTHER): Payer: Medicare Other

## 2015-02-02 ENCOUNTER — Other Ambulatory Visit (HOSPITAL_BASED_OUTPATIENT_CLINIC_OR_DEPARTMENT_OTHER): Payer: Medicare Other

## 2015-02-02 ENCOUNTER — Encounter: Payer: Self-pay | Admitting: Hematology

## 2015-02-02 VITALS — BP 91/55 | HR 102 | Temp 98.5°F | Resp 17

## 2015-02-02 DIAGNOSIS — D649 Anemia, unspecified: Secondary | ICD-10-CM

## 2015-02-02 DIAGNOSIS — D509 Iron deficiency anemia, unspecified: Secondary | ICD-10-CM

## 2015-02-02 DIAGNOSIS — D473 Essential (hemorrhagic) thrombocythemia: Secondary | ICD-10-CM | POA: Diagnosis not present

## 2015-02-02 DIAGNOSIS — D638 Anemia in other chronic diseases classified elsewhere: Secondary | ICD-10-CM | POA: Diagnosis not present

## 2015-02-02 LAB — CBC & DIFF AND RETIC
BASO%: 0.3 % (ref 0.0–2.0)
Basophils Absolute: 0 10*3/uL (ref 0.0–0.1)
EOS%: 3.3 % (ref 0.0–7.0)
Eosinophils Absolute: 0.3 10*3/uL (ref 0.0–0.5)
HEMATOCRIT: 27.6 % — AB (ref 38.4–49.9)
HGB: 8.4 g/dL — ABNORMAL LOW (ref 13.0–17.1)
Immature Retic Fract: 11.5 % — ABNORMAL HIGH (ref 3.00–10.60)
LYMPH#: 2 10*3/uL (ref 0.9–3.3)
LYMPH%: 19.7 % (ref 14.0–49.0)
MCH: 24 pg — AB (ref 27.2–33.4)
MCHC: 30.4 g/dL — AB (ref 32.0–36.0)
MCV: 78.9 fL — ABNORMAL LOW (ref 79.3–98.0)
MONO#: 1.4 10*3/uL — ABNORMAL HIGH (ref 0.1–0.9)
MONO%: 14.1 % — ABNORMAL HIGH (ref 0.0–14.0)
NEUT#: 6.4 10*3/uL (ref 1.5–6.5)
NEUT%: 62.6 % (ref 39.0–75.0)
PLATELETS: 554 10*3/uL — AB (ref 140–400)
RBC: 3.5 10*6/uL — ABNORMAL LOW (ref 4.20–5.82)
RDW: 15.5 % — ABNORMAL HIGH (ref 11.0–14.6)
RETIC %: 1.5 % (ref 0.80–1.80)
Retic Ct Abs: 52.5 10*3/uL (ref 34.80–93.90)
WBC: 10.2 10*3/uL (ref 4.0–10.3)
nRBC: 0 % (ref 0–0)

## 2015-02-02 MED ORDER — DARBEPOETIN ALFA 100 MCG/0.5ML IJ SOSY
100.0000 ug | PREFILLED_SYRINGE | Freq: Once | INTRAMUSCULAR | Status: AC
  Administered 2015-02-02: 100 ug via SUBCUTANEOUS
  Filled 2015-02-02: qty 0.5
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
## 2015-02-02 NOTE — Telephone Encounter (Signed)
per pof to sch pt appt-gave pt copy fo avs °

## 2015-02-26 ENCOUNTER — Encounter: Payer: Self-pay | Admitting: Infectious Diseases

## 2015-03-02 ENCOUNTER — Encounter (HOSPITAL_BASED_OUTPATIENT_CLINIC_OR_DEPARTMENT_OTHER): Payer: Medicare Other | Attending: Plastic Surgery

## 2015-03-02 DIAGNOSIS — L89892 Pressure ulcer of other site, stage 2: Secondary | ICD-10-CM | POA: Insufficient documentation

## 2015-03-02 DIAGNOSIS — L89894 Pressure ulcer of other site, stage 4: Secondary | ICD-10-CM | POA: Diagnosis not present

## 2015-03-02 DIAGNOSIS — L89890 Pressure ulcer of other site, unstageable: Secondary | ICD-10-CM | POA: Diagnosis not present

## 2015-03-02 DIAGNOSIS — M869 Osteomyelitis, unspecified: Secondary | ICD-10-CM | POA: Insufficient documentation

## 2015-03-02 DIAGNOSIS — L89154 Pressure ulcer of sacral region, stage 4: Secondary | ICD-10-CM | POA: Insufficient documentation

## 2015-03-02 DIAGNOSIS — L89520 Pressure ulcer of left ankle, unstageable: Secondary | ICD-10-CM | POA: Insufficient documentation

## 2015-03-02 DIAGNOSIS — L89324 Pressure ulcer of left buttock, stage 4: Secondary | ICD-10-CM | POA: Insufficient documentation

## 2015-03-05 IMAGING — CR DG CHEST 1V
1 series · 1 of 1 positions shown · non-contrast
Comparison: 05/04/2012

CLINICAL DATA: Short of breath and fever

CHEST - 1 VIEW

[AP]
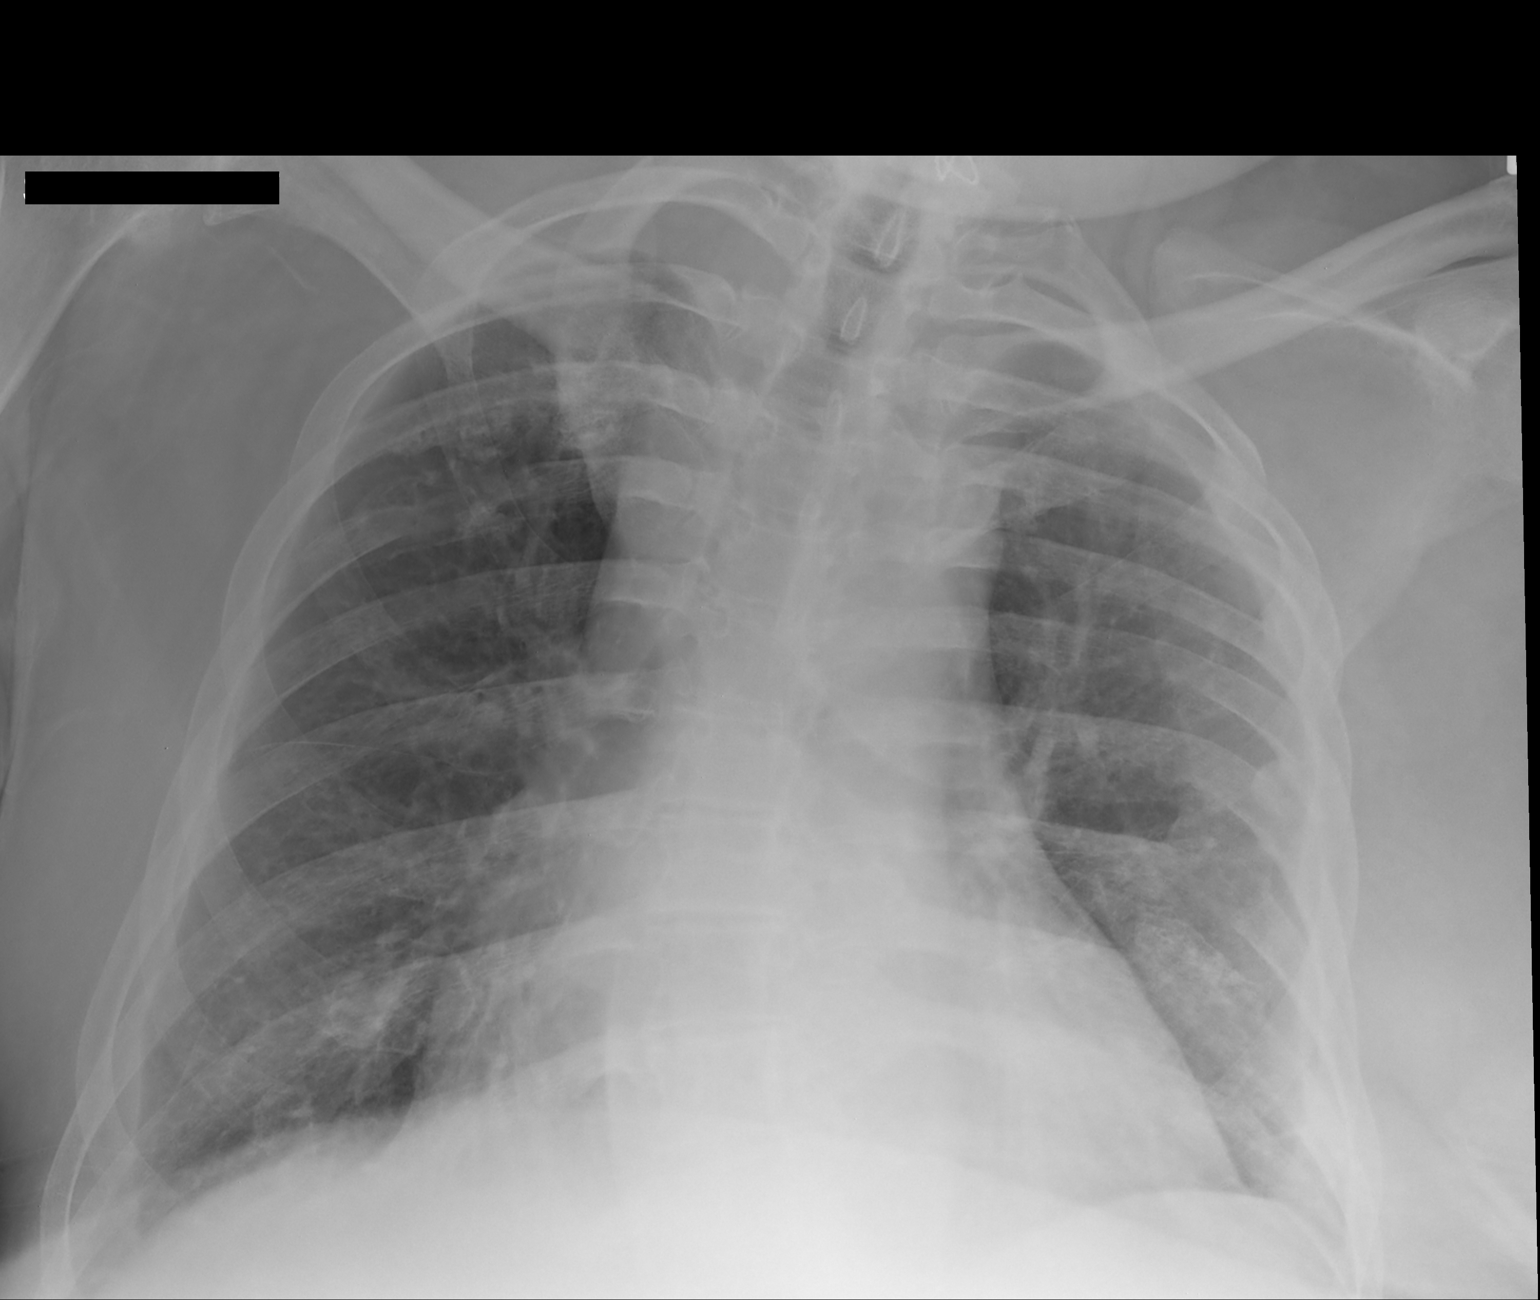

[1 of 1 positions shown; findings below may reference images not displayed]

FINDINGS: Cardiac enlargement without heart failure.  Small pleural
effusions bilaterally.  No definite heart failure or edema.
Negative for pneumonia.
IMPRESSION: Small bilateral pleural effusions without definite heart failure or
pneumonia.

## 2015-03-07 IMAGING — CR DG ABDOMEN 1V
1 series · 1 of 1 positions shown · non-contrast
Comparison: 05/17/2012

CLINICAL DATA: Suprapubic catheter placement

ABDOMEN - 1 VIEW

[x abdomen supine]
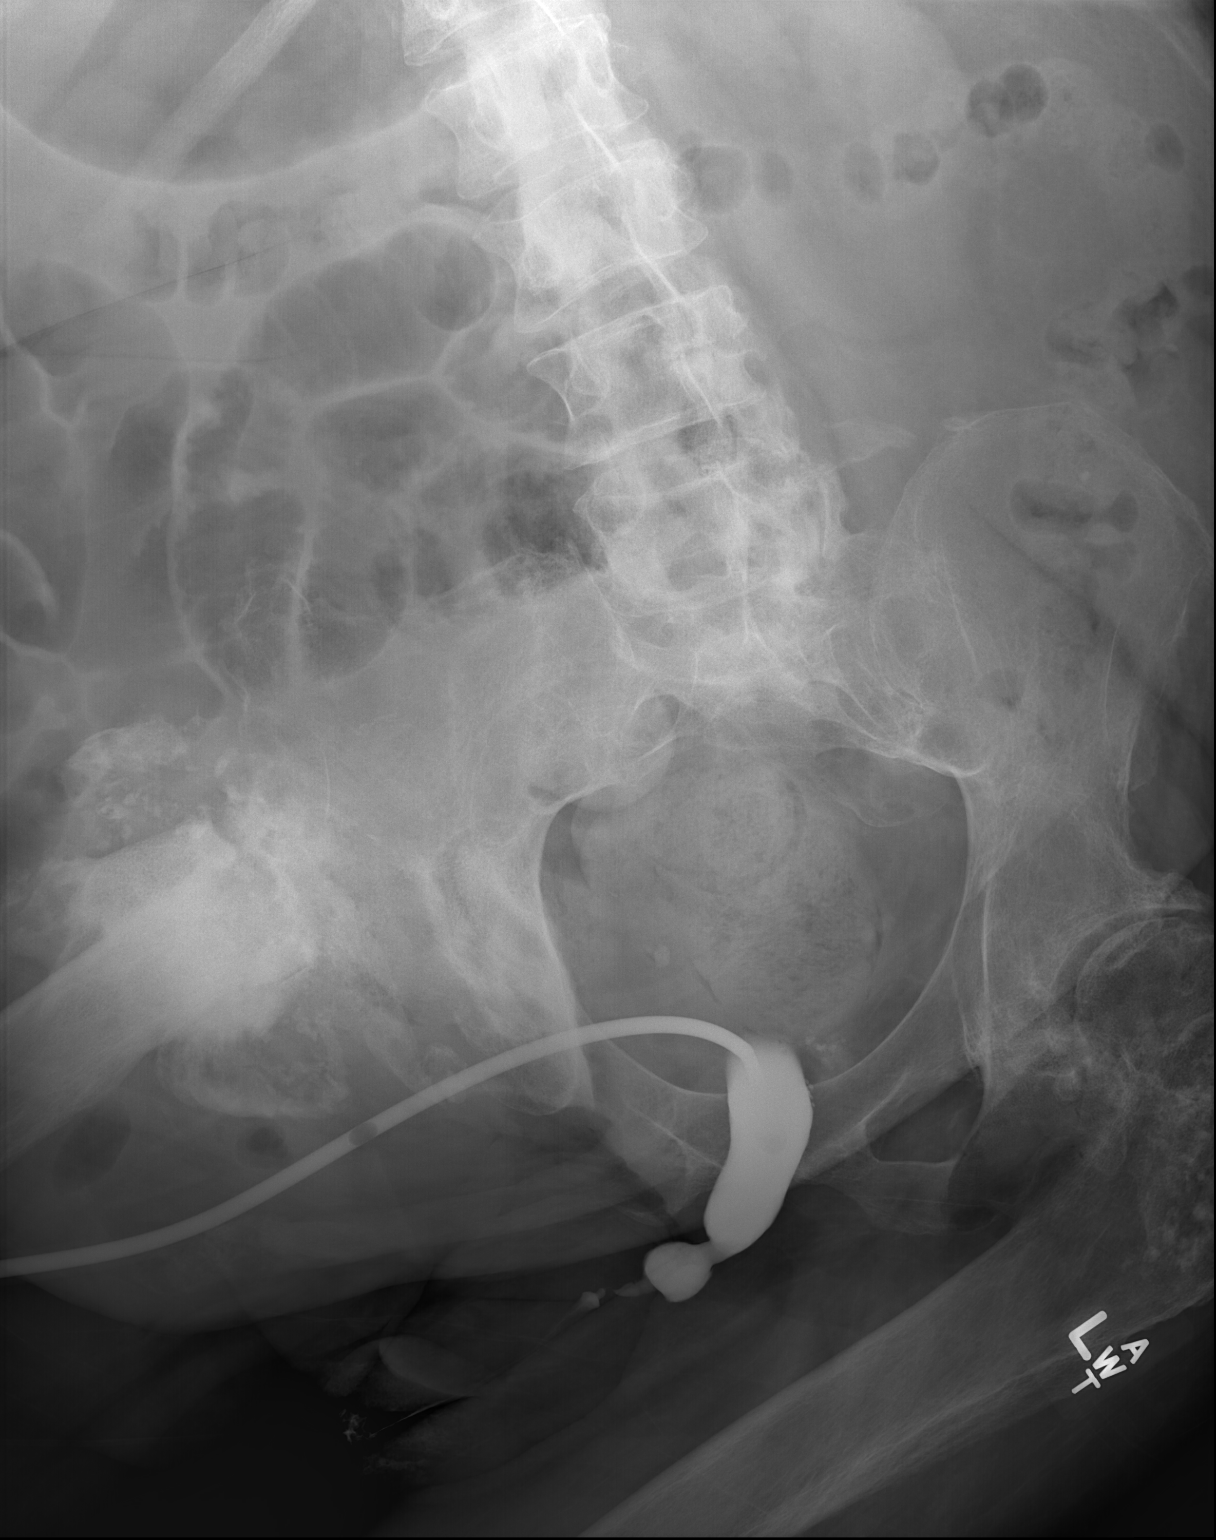

[1 of 1 positions shown; findings below may reference images not displayed]

FINDINGS: 50 ml of Omnipaque-T55 was instilled by gravity drip into the
replaced suprapubic catheter.
Contrast opacifies a dilated proximal urethra.
Small amount of contrast is seen transiting the penile urethra.
Remainder of bladder is not visualized.
Increased stool in rectum.
Nonspecific bowel gas pattern.
Severe osseous demineralization with extensive deformity and
destruction of the right hip.
IMPRESSION: Instilled contrast through the suprapubic catheter opacifies a
dilated proximal urethra.

## 2015-03-12 ENCOUNTER — Telehealth: Payer: Self-pay | Admitting: Hematology

## 2015-03-12 NOTE — Telephone Encounter (Signed)
pt called to r/s appt done...pt ok and ware of new d.t

## 2015-03-15 ENCOUNTER — Emergency Department (HOSPITAL_COMMUNITY): Payer: Medicare Other

## 2015-03-15 ENCOUNTER — Inpatient Hospital Stay (HOSPITAL_COMMUNITY)
Admission: EM | Admit: 2015-03-15 | Discharge: 2015-03-20 | DRG: 698 | Disposition: A | Discharge status: Home / Self Care | Payer: Medicare Other | Attending: Internal Medicine | Admitting: Internal Medicine

## 2015-03-15 ENCOUNTER — Encounter (HOSPITAL_COMMUNITY): Payer: Self-pay

## 2015-03-15 DIAGNOSIS — E876 Hypokalemia: Secondary | ICD-10-CM | POA: Diagnosis present

## 2015-03-15 DIAGNOSIS — L89154 Pressure ulcer of sacral region, stage 4: Secondary | ICD-10-CM | POA: Diagnosis present

## 2015-03-15 DIAGNOSIS — Z792 Long term (current) use of antibiotics: Secondary | ICD-10-CM

## 2015-03-15 DIAGNOSIS — E662 Morbid (severe) obesity with alveolar hypoventilation: Secondary | ICD-10-CM | POA: Diagnosis present

## 2015-03-15 DIAGNOSIS — B999 Unspecified infectious disease: Secondary | ICD-10-CM

## 2015-03-15 DIAGNOSIS — G4733 Obstructive sleep apnea (adult) (pediatric): Secondary | ICD-10-CM | POA: Diagnosis present

## 2015-03-15 DIAGNOSIS — Z9359 Other cystostomy status: Secondary | ICD-10-CM

## 2015-03-15 DIAGNOSIS — Z933 Colostomy status: Secondary | ICD-10-CM

## 2015-03-15 DIAGNOSIS — R1110 Vomiting, unspecified: Secondary | ICD-10-CM | POA: Diagnosis present

## 2015-03-15 DIAGNOSIS — Z888 Allergy status to other drugs, medicaments and biological substances status: Secondary | ICD-10-CM

## 2015-03-15 DIAGNOSIS — K219 Gastro-esophageal reflux disease without esophagitis: Secondary | ICD-10-CM | POA: Diagnosis present

## 2015-03-15 DIAGNOSIS — D638 Anemia in other chronic diseases classified elsewhere: Secondary | ICD-10-CM | POA: Diagnosis present

## 2015-03-15 DIAGNOSIS — A419 Sepsis, unspecified organism: Secondary | ICD-10-CM | POA: Diagnosis present

## 2015-03-15 DIAGNOSIS — T83518A Infection and inflammatory reaction due to other urinary catheter, initial encounter: Principal | ICD-10-CM | POA: Diagnosis present

## 2015-03-15 DIAGNOSIS — M86659 Other chronic osteomyelitis, unspecified thigh: Secondary | ICD-10-CM | POA: Diagnosis present

## 2015-03-15 DIAGNOSIS — M4628 Osteomyelitis of vertebra, sacral and sacrococcygeal region: Secondary | ICD-10-CM

## 2015-03-15 DIAGNOSIS — I10 Essential (primary) hypertension: Secondary | ICD-10-CM | POA: Diagnosis present

## 2015-03-15 DIAGNOSIS — Z8619 Personal history of other infectious and parasitic diseases: Secondary | ICD-10-CM | POA: Insufficient documentation

## 2015-03-15 DIAGNOSIS — N390 Urinary tract infection, site not specified: Secondary | ICD-10-CM | POA: Diagnosis present

## 2015-03-15 DIAGNOSIS — M86651 Other chronic osteomyelitis, right thigh: Secondary | ICD-10-CM | POA: Diagnosis present

## 2015-03-15 DIAGNOSIS — J9610 Chronic respiratory failure, unspecified whether with hypoxia or hypercapnia: Secondary | ICD-10-CM | POA: Diagnosis present

## 2015-03-15 DIAGNOSIS — I739 Peripheral vascular disease, unspecified: Secondary | ICD-10-CM | POA: Diagnosis present

## 2015-03-15 DIAGNOSIS — G8250 Quadriplegia, unspecified: Secondary | ICD-10-CM | POA: Diagnosis present

## 2015-03-15 DIAGNOSIS — R112 Nausea with vomiting, unspecified: Secondary | ICD-10-CM

## 2015-03-15 DIAGNOSIS — D649 Anemia, unspecified: Secondary | ICD-10-CM | POA: Diagnosis present

## 2015-03-15 DIAGNOSIS — R109 Unspecified abdominal pain: Secondary | ICD-10-CM | POA: Insufficient documentation

## 2015-03-15 DIAGNOSIS — K5909 Other constipation: Secondary | ICD-10-CM | POA: Diagnosis present

## 2015-03-15 DIAGNOSIS — D509 Iron deficiency anemia, unspecified: Secondary | ICD-10-CM | POA: Diagnosis present

## 2015-03-15 DIAGNOSIS — E43 Unspecified severe protein-calorie malnutrition: Secondary | ICD-10-CM | POA: Diagnosis present

## 2015-03-15 DIAGNOSIS — K5900 Constipation, unspecified: Secondary | ICD-10-CM | POA: Diagnosis present

## 2015-03-15 LAB — COMPREHENSIVE METABOLIC PANEL
ALK PHOS: 74 U/L (ref 38–126)
ALT: 11 U/L — AB (ref 17–63)
AST: 14 U/L — ABNORMAL LOW (ref 15–41)
Albumin: 2.2 g/dL — ABNORMAL LOW (ref 3.5–5.0)
Anion gap: 11 (ref 5–15)
BILIRUBIN TOTAL: 0.5 mg/dL (ref 0.3–1.2)
BUN: 22 mg/dL — ABNORMAL HIGH (ref 6–20)
CALCIUM: 8.2 mg/dL — AB (ref 8.9–10.3)
CO2: 26 mmol/L (ref 22–32)
CREATININE: 0.83 mg/dL (ref 0.61–1.24)
Chloride: 100 mmol/L — ABNORMAL LOW (ref 101–111)
GFR calc non Af Amer: >60 mL/min (ref >60)
Glucose, Bld: 118 mg/dL — ABNORMAL HIGH (ref 65–99)
Potassium: 3.7 mmol/L (ref 3.5–5.1)
Sodium: 137 mmol/L (ref 135–145)
TOTAL PROTEIN: 6.7 g/dL (ref 6.5–8.1)

## 2015-03-15 LAB — I-STAT CHEM 8, ED
BUN: 27 mg/dL — AB (ref 6–20)
Calcium, Ion: 1.06 mmol/L — ABNORMAL LOW (ref 1.12–1.23)
Chloride: 103 mmol/L (ref 101–111)
Creatinine, Ser: 0.8 mg/dL (ref 0.61–1.24)
Glucose, Bld: 118 mg/dL — ABNORMAL HIGH (ref 65–99)
HEMATOCRIT: 27 % — AB (ref 39.0–52.0)
HEMOGLOBIN: 9.2 g/dL — AB (ref 13.0–17.0)
POTASSIUM: 4 mmol/L (ref 3.5–5.1)
SODIUM: 141 mmol/L (ref 135–145)
TCO2: 26 mmol/L (ref 0–100)

## 2015-03-15 LAB — I-STAT CREATININE, ED: Creatinine, Ser: 0.9 mg/dL (ref 0.61–1.24)

## 2015-03-15 LAB — LIPASE, BLOOD: Lipase: 22 U/L (ref 11–51)

## 2015-03-15 MED ORDER — SODIUM CHLORIDE 0.9 % IV BOLUS (SEPSIS)
1000.0000 mL | Freq: Once | INTRAVENOUS | Status: AC
  Administered 2015-03-15: 1000 mL via INTRAVENOUS

## 2015-03-15 MED ORDER — ONDANSETRON HCL 4 MG/2ML IJ SOLN
4.0000 mg | Freq: Once | INTRAMUSCULAR | Status: AC
  Administered 2015-03-16: 4 mg via INTRAVENOUS
  Filled 2015-03-15: qty 2

## 2015-03-15 MED ORDER — SODIUM CHLORIDE 0.9 % IV BOLUS (SEPSIS): 1000.0000 mL | Freq: Once | INTRAVENOUS | Status: DC

## 2015-03-15 MED ORDER — ONDANSETRON 4 MG PO TBDP
8.0000 mg | ORAL_TABLET | Freq: Once | ORAL | Status: AC
  Administered 2015-03-15: 8 mg via ORAL
  Filled 2015-03-15: qty 2

## 2015-03-15 MED ORDER — ONDANSETRON 4 MG PO TBDP
4.0000 mg | ORAL_TABLET | Freq: Once | ORAL | Status: AC
  Administered 2015-03-15: 4 mg via ORAL
  Filled 2015-03-15: qty 1

## 2015-03-15 NOTE — ED Notes (Signed)
Pt able to tolerate 2 sips of oral contrast. Refusing to drink any more.
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
## 2015-03-15 NOTE — ED Notes (Signed)
To room via EMS.  Onset last night nausea, weakness.  Onset this morning vomiting and dry heaving.  Pt is a C5 complete quad.  No complaints of pain.  PTAR attempted IV x 2, pt reports he usually needs to have a PICC.

## 2015-03-16 ENCOUNTER — Encounter (HOSPITAL_COMMUNITY): Payer: Self-pay | Admitting: General Practice

## 2015-03-16 ENCOUNTER — Emergency Department (HOSPITAL_COMMUNITY): Payer: Medicare Other

## 2015-03-16 ENCOUNTER — Other Ambulatory Visit: Payer: Medicare Other

## 2015-03-16 ENCOUNTER — Ambulatory Visit: Payer: Medicare Other

## 2015-03-16 ENCOUNTER — Inpatient Hospital Stay (HOSPITAL_COMMUNITY): Payer: Medicare Other

## 2015-03-16 DIAGNOSIS — B999 Unspecified infectious disease: Secondary | ICD-10-CM | POA: Diagnosis present

## 2015-03-16 DIAGNOSIS — I10 Essential (primary) hypertension: Secondary | ICD-10-CM | POA: Diagnosis present

## 2015-03-16 DIAGNOSIS — K5900 Constipation, unspecified: Secondary | ICD-10-CM | POA: Diagnosis present

## 2015-03-16 DIAGNOSIS — E876 Hypokalemia: Secondary | ICD-10-CM | POA: Diagnosis present

## 2015-03-16 DIAGNOSIS — G8250 Quadriplegia, unspecified: Secondary | ICD-10-CM

## 2015-03-16 DIAGNOSIS — K5909 Other constipation: Secondary | ICD-10-CM | POA: Diagnosis present

## 2015-03-16 DIAGNOSIS — R1110 Vomiting, unspecified: Secondary | ICD-10-CM | POA: Diagnosis present

## 2015-03-16 DIAGNOSIS — D500 Iron deficiency anemia secondary to blood loss (chronic): Secondary | ICD-10-CM | POA: Diagnosis not present

## 2015-03-16 DIAGNOSIS — L89619 Pressure ulcer of right heel, unspecified stage: Secondary | ICD-10-CM | POA: Diagnosis not present

## 2015-03-16 DIAGNOSIS — I739 Peripheral vascular disease, unspecified: Secondary | ICD-10-CM | POA: Diagnosis present

## 2015-03-16 DIAGNOSIS — F502 Bulimia nervosa: Secondary | ICD-10-CM | POA: Diagnosis not present

## 2015-03-16 DIAGNOSIS — L89629 Pressure ulcer of left heel, unspecified stage: Secondary | ICD-10-CM

## 2015-03-16 DIAGNOSIS — T83518D Infection and inflammatory reaction due to other urinary catheter, subsequent encounter: Secondary | ICD-10-CM | POA: Diagnosis not present

## 2015-03-16 DIAGNOSIS — B9689 Other specified bacterial agents as the cause of diseases classified elsewhere: Secondary | ICD-10-CM | POA: Diagnosis not present

## 2015-03-16 DIAGNOSIS — R109 Unspecified abdominal pain: Secondary | ICD-10-CM | POA: Diagnosis not present

## 2015-03-16 DIAGNOSIS — D638 Anemia in other chronic diseases classified elsewhere: Secondary | ICD-10-CM | POA: Diagnosis present

## 2015-03-16 DIAGNOSIS — L89154 Pressure ulcer of sacral region, stage 4: Secondary | ICD-10-CM

## 2015-03-16 DIAGNOSIS — G43A0 Cyclical vomiting, not intractable: Secondary | ICD-10-CM | POA: Diagnosis not present

## 2015-03-16 DIAGNOSIS — M86659 Other chronic osteomyelitis, unspecified thigh: Secondary | ICD-10-CM | POA: Diagnosis not present

## 2015-03-16 DIAGNOSIS — Z960 Presence of urogenital implants: Secondary | ICD-10-CM

## 2015-03-16 DIAGNOSIS — Z888 Allergy status to other drugs, medicaments and biological substances status: Secondary | ICD-10-CM | POA: Diagnosis not present

## 2015-03-16 DIAGNOSIS — D509 Iron deficiency anemia, unspecified: Secondary | ICD-10-CM | POA: Diagnosis present

## 2015-03-16 DIAGNOSIS — A419 Sepsis, unspecified organism: Secondary | ICD-10-CM | POA: Diagnosis present

## 2015-03-16 DIAGNOSIS — Z8619 Personal history of other infectious and parasitic diseases: Secondary | ICD-10-CM | POA: Diagnosis not present

## 2015-03-16 DIAGNOSIS — M8668 Other chronic osteomyelitis, other site: Secondary | ICD-10-CM | POA: Diagnosis not present

## 2015-03-16 DIAGNOSIS — N390 Urinary tract infection, site not specified: Secondary | ICD-10-CM | POA: Diagnosis present

## 2015-03-16 DIAGNOSIS — R8299 Other abnormal findings in urine: Secondary | ICD-10-CM

## 2015-03-16 DIAGNOSIS — K219 Gastro-esophageal reflux disease without esophagitis: Secondary | ICD-10-CM | POA: Diagnosis present

## 2015-03-16 DIAGNOSIS — Z933 Colostomy status: Secondary | ICD-10-CM | POA: Diagnosis not present

## 2015-03-16 DIAGNOSIS — R112 Nausea with vomiting, unspecified: Secondary | ICD-10-CM

## 2015-03-16 DIAGNOSIS — J9610 Chronic respiratory failure, unspecified whether with hypoxia or hypercapnia: Secondary | ICD-10-CM | POA: Diagnosis present

## 2015-03-16 DIAGNOSIS — T83518A Infection and inflammatory reaction due to other urinary catheter, initial encounter: Secondary | ICD-10-CM | POA: Diagnosis present

## 2015-03-16 DIAGNOSIS — Z792 Long term (current) use of antibiotics: Secondary | ICD-10-CM

## 2015-03-16 DIAGNOSIS — E662 Morbid (severe) obesity with alveolar hypoventilation: Secondary | ICD-10-CM | POA: Diagnosis present

## 2015-03-16 DIAGNOSIS — D520 Dietary folate deficiency anemia: Secondary | ICD-10-CM | POA: Diagnosis not present

## 2015-03-16 DIAGNOSIS — R1114 Bilious vomiting: Secondary | ICD-10-CM | POA: Diagnosis not present

## 2015-03-16 DIAGNOSIS — M86651 Other chronic osteomyelitis, right thigh: Secondary | ICD-10-CM | POA: Diagnosis present

## 2015-03-16 DIAGNOSIS — G4733 Obstructive sleep apnea (adult) (pediatric): Secondary | ICD-10-CM | POA: Diagnosis not present

## 2015-03-16 DIAGNOSIS — E43 Unspecified severe protein-calorie malnutrition: Secondary | ICD-10-CM | POA: Diagnosis present

## 2015-03-16 DIAGNOSIS — R8290 Unspecified abnormal findings in urine: Secondary | ICD-10-CM | POA: Diagnosis not present

## 2015-03-16 LAB — CBC WITH DIFFERENTIAL/PLATELET
BASOS ABS: 0 10*3/uL (ref 0.0–0.1)
BASOS PCT: 0 %
Eosinophils Absolute: 0.5 10*3/uL (ref 0.0–0.7)
Eosinophils Relative: 3 %
HEMATOCRIT: 22.1 % — AB (ref 39.0–52.0)
HEMOGLOBIN: 6.4 g/dL — AB (ref 13.0–17.0)
LYMPHS PCT: 5 %
Lymphs Abs: 0.7 10*3/uL (ref 0.7–4.0)
MCH: 22.5 pg — ABNORMAL LOW (ref 26.0–34.0)
MCHC: 29 g/dL — ABNORMAL LOW (ref 30.0–36.0)
MCV: 77.8 fL — AB (ref 78.0–100.0)
MONO ABS: 1.1 10*3/uL — AB (ref 0.1–1.0)
Monocytes Relative: 7 %
NEUTROS ABS: 12.7 10*3/uL — AB (ref 1.7–7.7)
NEUTROS PCT: 85 %
Platelets: 589 10*3/uL — ABNORMAL HIGH (ref 150–400)
RBC: 2.84 MIL/uL — AB (ref 4.22–5.81)
RDW: 16.3 % — AB (ref 11.5–15.5)
WBC: 14.9 10*3/uL — ABNORMAL HIGH (ref 4.0–10.5)

## 2015-03-16 LAB — URINALYSIS, ROUTINE W REFLEX MICROSCOPIC
Glucose, UA: NEGATIVE mg/dL
Ketones, ur: 15 mg/dL — AB
NITRITE: NEGATIVE
PH: 7.5 (ref 5.0–8.0)
Protein, ur: 30 mg/dL — AB
SPECIFIC GRAVITY, URINE: 1.02 (ref 1.005–1.030)
UROBILINOGEN UA: 1 mg/dL (ref 0.0–1.0)

## 2015-03-16 LAB — URINE MICROSCOPIC-ADD ON

## 2015-03-16 LAB — PROTIME-INR
INR: 1.35 (ref 0.00–1.49)
PROTHROMBIN TIME: 16.8 s — AB (ref 11.6–15.2)

## 2015-03-16 LAB — APTT: APTT: 21 s — AB (ref 24–37)

## 2015-03-16 LAB — PROCALCITONIN: PROCALCITONIN: 0.17 ng/mL

## 2015-03-16 LAB — LACTIC ACID, PLASMA: LACTIC ACID, VENOUS: 0.7 mmol/L (ref 0.5–2.0)

## 2015-03-16 IMAGING — CR DG CHEST 2V
3 series · 3 of 3 positions shown · non-contrast
Comparison: 07/21/2012

CLINICAL DATA: Fever and vomiting

CHEST - 2 VIEW

[w chest lat]
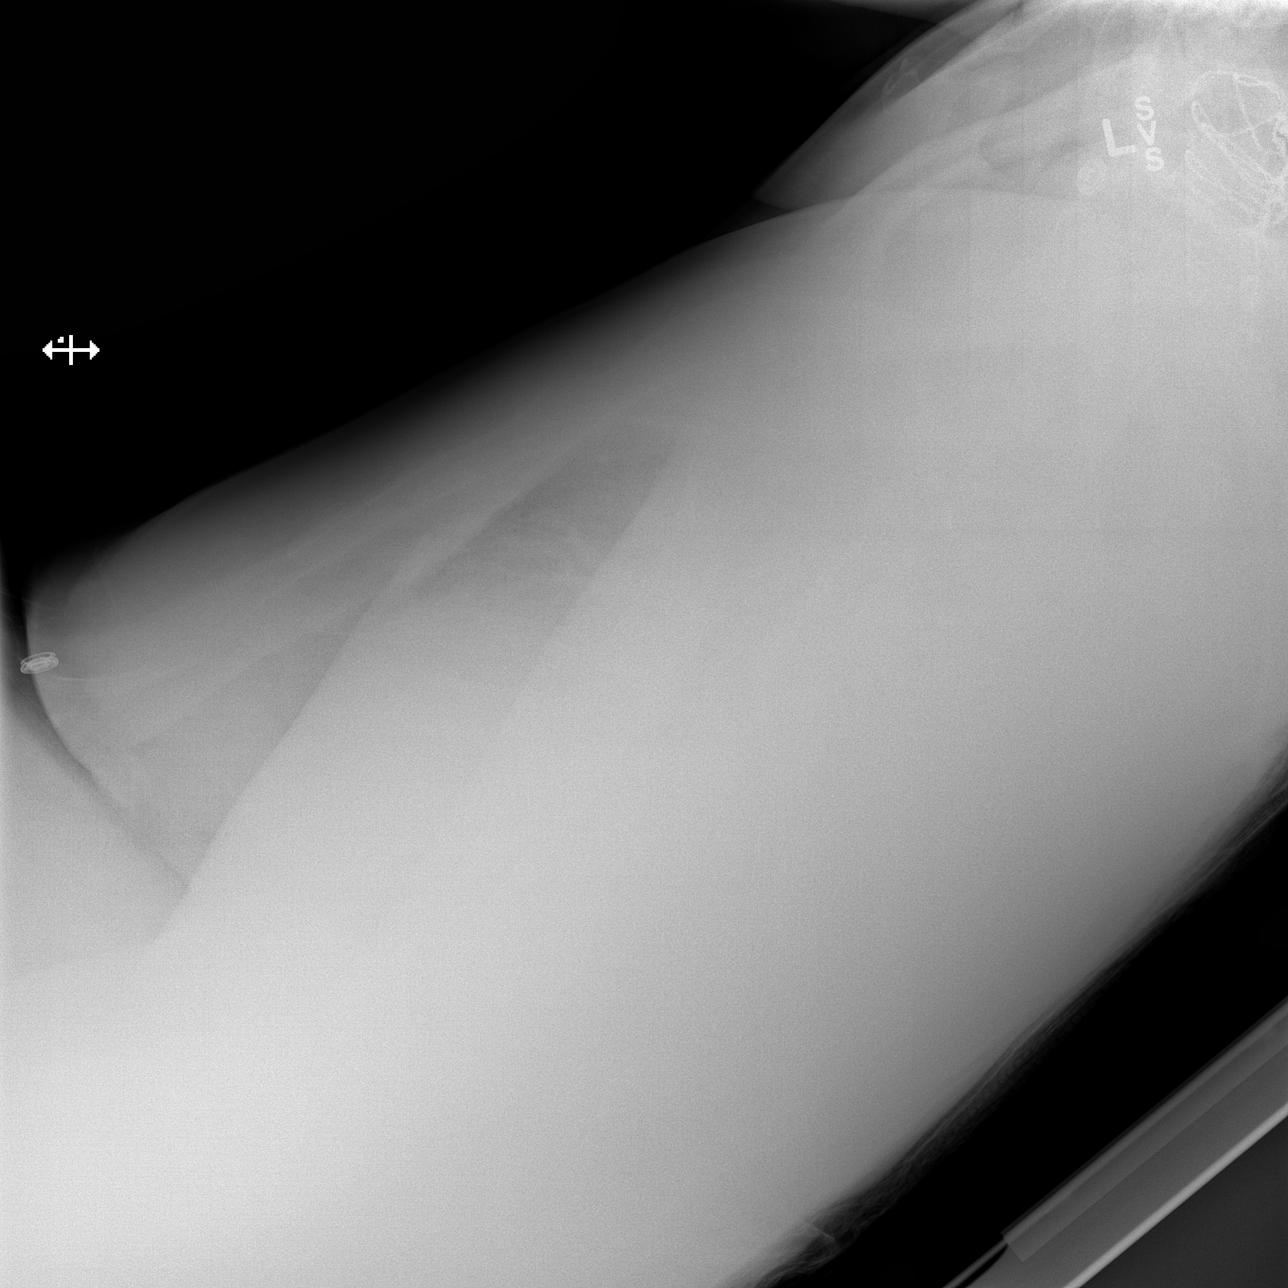

[x chest ap (1 of 2)]
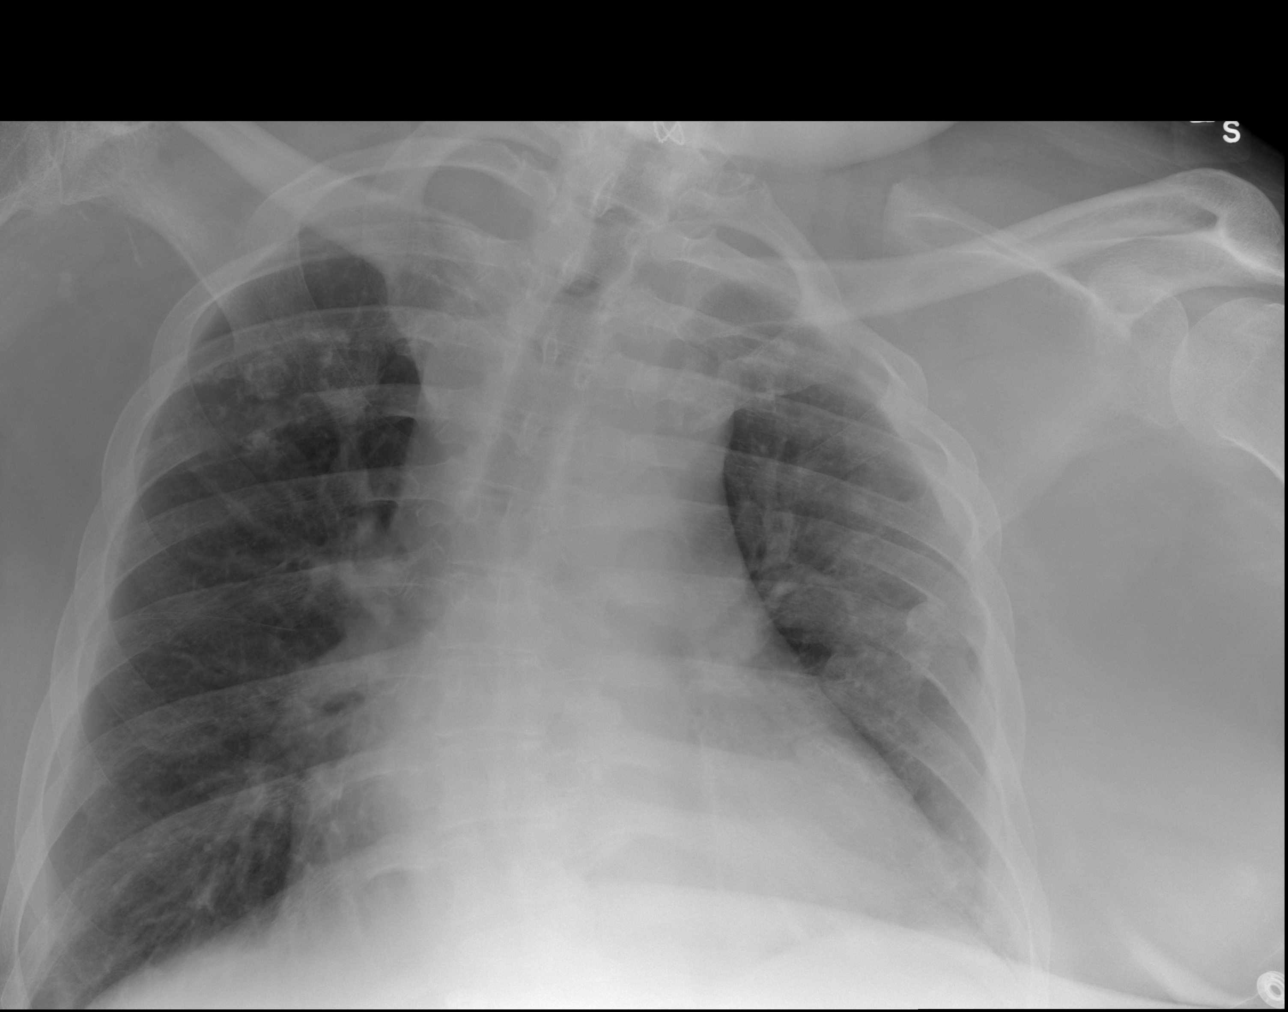

[x chest ap (2 of 2)]
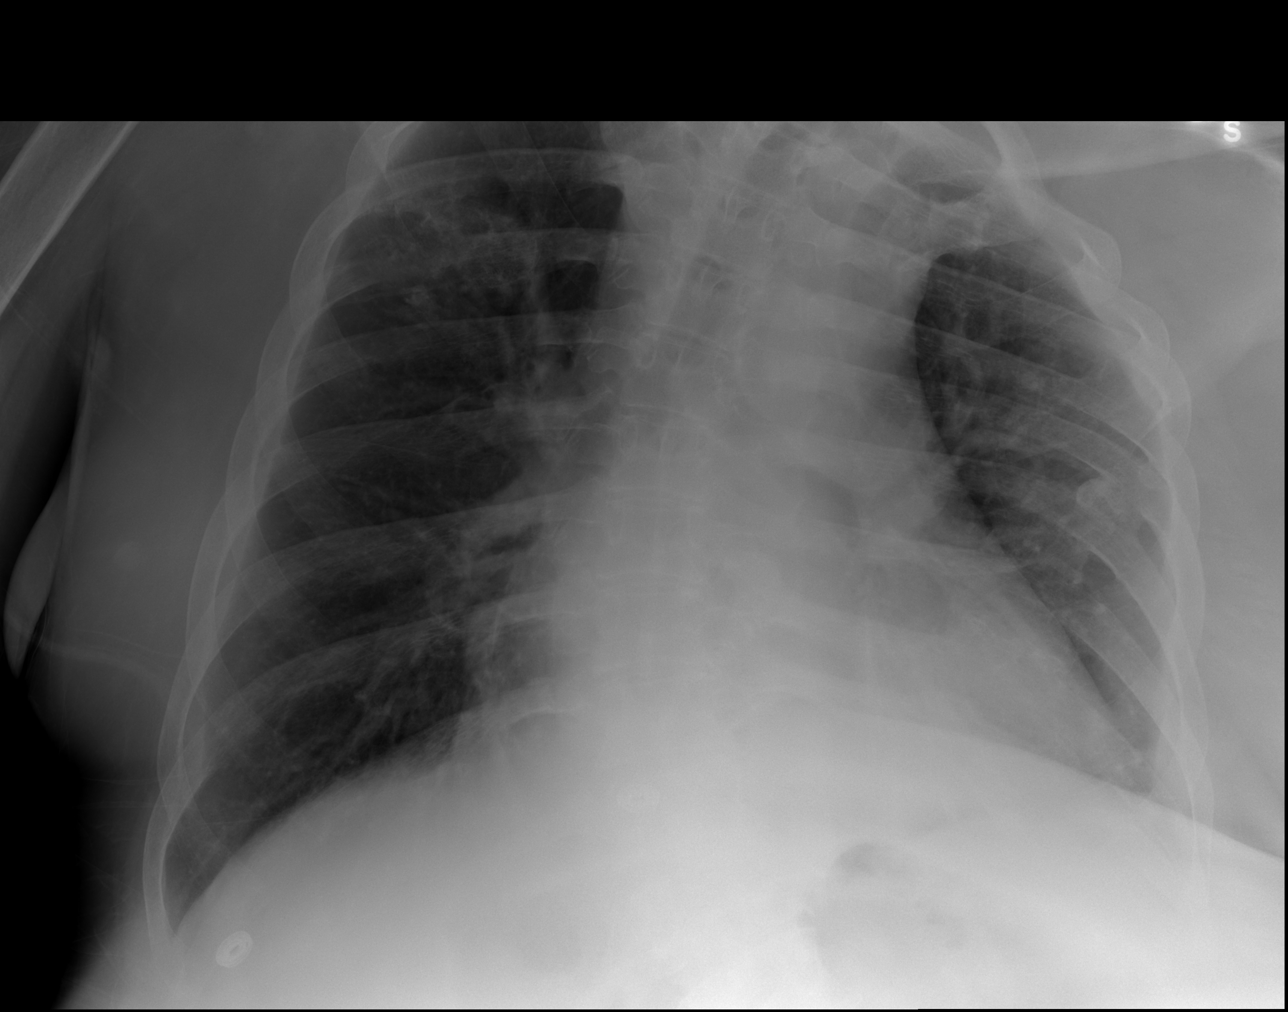

[3 of 3 positions shown; findings below may reference images not displayed]

FINDINGS: Nodular scarring with calcifications in the upper lungs
suggesting postinflammatory change.  This is stable.  Shallow
inspiration.  Cardiac enlargement.  Bilateral pleural thickening in
the apices.  Old left rib fractures.  No pulmonary vascular
congestion or edema.  No focal consolidation.  No apparent blunting
of costophrenic angles although positioning limits evaluation.
Postoperative changes in the cervical spine.
IMPRESSION: Chronic changes are stable since previous study.  Mild cardiac
enlargement.  No evidence of vascular congestion, edema, or
consolidation.

## 2015-03-16 MED ORDER — HEPARIN SODIUM (PORCINE) 5000 UNIT/ML IJ SOLN
5000.0000 [IU] | Freq: Three times a day (TID) | INTRAMUSCULAR | Status: DC
  Administered 2015-03-16 – 2015-03-20 (×13): 5000 [IU] via SUBCUTANEOUS
  Filled 2015-03-16 (×11): qty 1

## 2015-03-16 MED ORDER — SODIUM CHLORIDE 0.9 % IJ SOLN
10.0000 mL | INTRAMUSCULAR | Status: DC | PRN
  Administered 2015-03-20: 10 mL
  Filled 2015-03-16: qty 40

## 2015-03-16 MED ORDER — SODIUM CHLORIDE 0.9 % IV SOLN
500.0000 mg | Freq: Four times a day (QID) | INTRAVENOUS | Status: DC
  Filled 2015-03-16 (×2): qty 500

## 2015-03-16 MED ORDER — COLLAGENASE 250 UNIT/GM EX OINT
TOPICAL_OINTMENT | Freq: Every day | CUTANEOUS | Status: DC
  Administered 2015-03-16 – 2015-03-20 (×4): via TOPICAL
  Filled 2015-03-16 (×2): qty 30

## 2015-03-16 MED ORDER — ACETAMINOPHEN 650 MG RE SUPP: 650.0000 mg | Freq: Four times a day (QID) | RECTAL | Status: DC | PRN

## 2015-03-16 MED ORDER — MAGNESIUM CITRATE PO SOLN
1.0000 | Freq: Once | ORAL | Status: AC
  Administered 2015-03-16: 1 via ORAL
  Filled 2015-03-16: qty 296

## 2015-03-16 MED ORDER — SODIUM CHLORIDE 0.9 % IV SOLN
INTRAVENOUS | Status: DC
  Administered 2015-03-16 – 2015-03-17 (×3): via INTRAVENOUS

## 2015-03-16 MED ORDER — DOCUSATE SODIUM 100 MG PO CAPS: 100.0000 mg | ORAL_CAPSULE | Freq: Two times a day (BID) | ORAL | Status: DC | PRN

## 2015-03-16 MED ORDER — SODIUM CHLORIDE 0.9 % IJ SOLN
10.0000 mL | Freq: Two times a day (BID) | INTRAMUSCULAR | Status: DC
  Administered 2015-03-17: 10 mL
  Administered 2015-03-17: 20 mL
  Administered 2015-03-18: 10 mL
  Administered 2015-03-20: 20 mL

## 2015-03-16 MED ORDER — IOHEXOL 300 MG/ML  SOLN
50.0000 mL | Freq: Once | INTRAMUSCULAR | Status: DC | PRN
  Administered 2015-03-16: 5 mL via INTRAVENOUS
  Filled 2015-03-16: qty 50

## 2015-03-16 MED ORDER — ONDANSETRON 4 MG PO TBDP: 4.0000 mg | ORAL_TABLET | Freq: Three times a day (TID) | ORAL | Status: DC | PRN

## 2015-03-16 MED ORDER — VANCOMYCIN HCL IN DEXTROSE 1-5 GM/200ML-% IV SOLN: 1000.0000 mg | Freq: Once | INTRAVENOUS | Status: DC

## 2015-03-16 MED ORDER — SODIUM CHLORIDE 0.9 % IV BOLUS (SEPSIS)
1000.0000 mL | Freq: Once | INTRAVENOUS | Status: AC
  Administered 2015-03-16: 1000 mL via INTRAVENOUS

## 2015-03-16 MED ORDER — BACLOFEN 10 MG PO TABS
20.0000 mg | ORAL_TABLET | Freq: Four times a day (QID) | ORAL | Status: DC
  Administered 2015-03-16 – 2015-03-17 (×5): 20 mg via ORAL
  Filled 2015-03-16 (×6): qty 2

## 2015-03-16 MED ORDER — LIDOCAINE HCL 1 % IJ SOLN
INTRAMUSCULAR | Status: AC
  Filled 2015-03-16: qty 20

## 2015-03-16 MED ORDER — ACETAMINOPHEN 325 MG PO TABS
650.0000 mg | ORAL_TABLET | Freq: Four times a day (QID) | ORAL | Status: DC | PRN
  Administered 2015-03-17: 650 mg via ORAL
  Filled 2015-03-16: qty 2

## 2015-03-16 MED ORDER — SODIUM CHLORIDE 0.9 % IJ SOLN
3.0000 mL | Freq: Two times a day (BID) | INTRAMUSCULAR | Status: DC
  Administered 2015-03-17: 3 mL via INTRAVENOUS
  Administered 2015-03-18: 10 mL via INTRAVENOUS
  Administered 2015-03-19: 3 mL via INTRAVENOUS

## 2015-03-16 MED ORDER — PROMETHAZINE HCL 25 MG/ML IJ SOLN: 25.0000 mg | Freq: Four times a day (QID) | INTRAMUSCULAR | Status: DC | PRN

## 2015-03-16 MED ORDER — SODIUM CHLORIDE 0.9 % IV SOLN
1750.0000 mg | Freq: Once | INTRAVENOUS | Status: DC
  Filled 2015-03-16: qty 1750

## 2015-03-16 MED ORDER — BOOST PO LIQD
237.0000 mL | Freq: Three times a day (TID) | ORAL | Status: DC
  Administered 2015-03-18 – 2015-03-20 (×7): 237 mL via ORAL
  Filled 2015-03-16 (×16): qty 237

## 2015-03-16 MED ORDER — METOCLOPRAMIDE HCL 5 MG/ML IJ SOLN
10.0000 mg | Freq: Once | INTRAMUSCULAR | Status: AC
  Administered 2015-03-16: 10 mg via INTRAVENOUS
  Filled 2015-03-16: qty 2

## 2015-03-16 MED ORDER — ONDANSETRON HCL 4 MG/2ML IJ SOLN
INTRAMUSCULAR | Status: AC
  Administered 2015-03-16: 4 mg via INTRAVENOUS
  Filled 2015-03-16: qty 2

## 2015-03-16 MED ORDER — BOOST PLUS PO LIQD
237.0000 mL | Freq: Two times a day (BID) | ORAL | Status: DC
  Administered 2015-03-18 – 2015-03-20 (×3): 237 mL via ORAL
  Filled 2015-03-16 (×11): qty 237

## 2015-03-16 MED ORDER — PIPERACILLIN-TAZOBACTAM 3.375 G IVPB
3.3750 g | Freq: Three times a day (TID) | INTRAVENOUS | Status: DC
  Administered 2015-03-16 – 2015-03-20 (×11): 3.375 g via INTRAVENOUS
  Filled 2015-03-16 (×17): qty 50

## 2015-03-16 MED ORDER — SODIUM CHLORIDE 0.9 % IV BOLUS (SEPSIS)
1000.0000 mL | Freq: Once | INTRAVENOUS | Status: DC
Start: 2015-03-16 — End: 2015-03-20

## 2015-03-16 MED ORDER — SODIUM CHLORIDE 0.9 % IV SOLN
Freq: Once | INTRAVENOUS | Status: AC
  Administered 2015-03-17: 02:00:00 via INTRAVENOUS

## 2015-03-16 MED ORDER — INFLUENZA VAC SPLIT QUAD 0.5 ML IM SUSY
0.5000 mL | PREFILLED_SYRINGE | INTRAMUSCULAR | Status: AC
  Administered 2015-03-17: 0.5 mL via INTRAMUSCULAR
  Filled 2015-03-16: qty 0.5

## 2015-03-16 MED ORDER — ONDANSETRON HCL 4 MG/2ML IJ SOLN
4.0000 mg | Freq: Three times a day (TID) | INTRAMUSCULAR | Status: AC | PRN
  Administered 2015-03-16: 4 mg via INTRAVENOUS

## 2015-03-16 MED ORDER — SUCRALFATE 1 G PO TABS
1.0000 g | ORAL_TABLET | Freq: Four times a day (QID) | ORAL | Status: DC
  Administered 2015-03-16 – 2015-03-20 (×18): 1 g via ORAL
  Filled 2015-03-16 (×17): qty 1

## 2015-03-16 MED ORDER — PANTOPRAZOLE SODIUM 40 MG PO TBEC
40.0000 mg | DELAYED_RELEASE_TABLET | Freq: Two times a day (BID) | ORAL | Status: DC
  Administered 2015-03-16 – 2015-03-20 (×9): 40 mg via ORAL
  Filled 2015-03-16 (×9): qty 1

## 2015-03-16 MED ORDER — PRO-STAT SUGAR FREE PO LIQD
30.0000 mL | Freq: Two times a day (BID) | ORAL | Status: DC
  Administered 2015-03-20: 30 mL via ORAL
  Filled 2015-03-16 (×4): qty 30

## 2015-03-16 MED ORDER — PIPERACILLIN-TAZOBACTAM 3.375 G IVPB 30 MIN
3.3750 g | Freq: Once | INTRAVENOUS | Status: AC
  Administered 2015-03-16: 3.375 g via INTRAVENOUS
  Filled 2015-03-16: qty 50

## 2015-03-16 NOTE — ED Notes (Signed)
Pt given ice water ok per RN Lennette Bihari

## 2015-03-16 NOTE — ED Notes (Signed)
PA made aware of pt's inability to drink contrast. PA requesting to go ahead and CT abdomen. CT made aware.

## 2015-03-16 NOTE — Progress Notes (Signed)
Called [phone] for report X2. No answer. Will retry again. [name] [name]
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
## 2015-03-16 NOTE — ED Provider Notes (Signed)
patient is still having persistent nausea and vomiting.  CT scan shows the patient is constipated.  He will be given subsets enema through his colostomy Patient has been given to subsets enema with little results.  He will be given by mouth makes it checked.  He states he does feel slightly better and is tolerating sips of water  Junius Creamer, NP 03/16/15 [place], MD 03/16/15 [phone]

## 2015-03-16 NOTE — ED Provider Notes (Signed)
Physical Exam  Constitutional: He is oriented to person, place, and time. He appears well-developed and well-nourished. No distress. Diaphoretic HENT:  Head: Normocephalic and atraumatic.  Mouth/Throat: Oropharynx is clear and moist.  Eyes: Conjunctivae and EOM are normal. Pupils are equal, round, and reactive to light.  Neck: Normal range of motion. Neck supple. No JVD Cardiovascular:   Regular rhythm, tachycardic and no M, G, R Pulmonary/Chest: Effort normal and breath sounds normal. No respiratory distress. He has no wheezes.  Abdominal: abdomen soft, BSx4 Musculoskeletal: He exhibits no edema.  Limited movement of extremities at baseline  Neurological: He is alert and oriented to person, place, and time.  Skin: Skin is warm, decreased skin turgor, diaphoretic Psychiatric: He has a normal mood and affect.  Nursing note and vitals reviewed.   Patient is a 50 year old male, has been in the ER for complaints of vomiting and weakness.  Patient was given to me at shift change, currently patient has been in the ER for roughly 12 hours. The patient had not had any response to enemas through his colostomy, and at shift change the plan was to administer mag citrate and wait for BM.  If the pt did not pass any stool he was to be admitted.  At the time of my exam patient was mildly tachycardic at 114 heart rate, hypotensive with systolic blood pressure in the 80s.  He had no documented temperature. He did not complain of pain, only complained of dyspepsia.  Stated that he has been weak and lightheaded with approximately 10 episodes of vomiting since early yesterday morning.  He has had a few vomiting episodes since being in the ER.  He was unable to drink contrast for the CT study, however he states that at approx 5 AM he was able to take a few sips of water and drink the mag citrate without difficulty and without further vomiting.    Lactic acid, CBC, blood cultures and urinalysis were added onto his  workup, as well as another liter of fluids.  The patient's case was reviewed with Dr. Noemi Chapel, who is in agreement with the additional orders and plan at this time. Patient will be admitted for further work up and treatment.  Current Vitals are as follows:  Filed Vitals:   03/16/15 0700 03/16/15 0715 03/16/15 0730 03/16/15 0800  BP: 84/62  82/51 87/58  Pulse:  114 103 110  Resp:    18  SpO2:  99% 100% 98%   Pt has had multiple attempts to collect labs, including 2 by phlebotomy, IV team, and I have personally looked with [place] for venous access.  Pt may need central line or PICC line.  PICC was ordered as urgent, with consult to IR for eval and management.  I spoke with Dr. Earleen Newport regarding the pt, I also spoke with the IR desk, who stated that they would be able to get to the pt at approx 3pm.  Dr. Sabra Heck has contacted hospitalist for admission for possible sepsis, with possible sources at this time including UTI, osteo, decubitus ulcers, intraabdominal.   BMP unremarkable, CBC is not yet obtained (per lab, CBC from last night clotted), UA is positive for UTI.  Pt has been in the ER for approximately 15 hours, with only 200 mL's in urine bag, indicating possible risk for kidney injury (although initial labs were not concerning for AKI).  A third L of IVF has been ordered.  Dr. Sabra Heck has added broad spectrum Abx coverage.  Dr. Saralyn Pilar to admit to tele.  He requested a surgical consult.  I spoke to the PA, [place] from [place], who reviewed the pt's CT and stated that the pt seemed more appropriate for GI consult.    The pt was admitted to the stepdown unit by Dr. Saralyn Pilar for continued workup and management.   Delsa Grana, PA-C 03/17/15 0740  Noemi Chapel, MD 03/18/15 [phone]
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
## 2015-03-16 NOTE — ED Provider Notes (Signed)
PICC team consult for access, the patient remains to have borderline hypotension and tachycardia per his baseline, broad-spectrum antibiotic ordered for urinary tract infection that may be causing sepsis. Discussed with the hospitalist who will admit the patient to the hospital.  Medical screening examination/treatment/procedure(s) were conducted as a shared visit with non-physician practitioner(s) and myself.  I personally evaluated the patient during the encounter.  Clinical Impression:   Final diagnoses:  Abdominal pain, unspecified abdominal location  Nausea and vomiting, vomiting of unspecified type         Noemi Chapel, MD 03/16/15 1055

## 2015-03-16 NOTE — Progress Notes (Signed)
03/16/2015 Patient transfer form the emergency room to 2central he is alert, orient and bedbound. Patient is a quadraplegic. He have stage 4 on sacrum heel and necrotic area under foot bottom. Left abdominal foles excoriated and peri area have wound noted. [facility] nurse was present at bed side and wound was measure and change. He have a super pubic cath and Left lower abdomen colostomy.  The colostomy have dark loose stool. It was empty and change by [facility] nurse. [facility] RN.

## 2015-03-16 NOTE — ED Notes (Signed)
Per. Dr. Sabra Heck. This patient has not had a documented temp in 12 hours, a urinalysis or a CBC.  I collected urine from catheter and took a oral and rectal temp.  Documented appropriately.  Lab is drawing cultures, CBC, and lactic acid now.
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
## 2015-03-16 NOTE — Progress Notes (Signed)
Utilization Review Completed.  

## 2015-03-16 NOTE — Procedures (Signed)
Placement of left brachial PICC.  Tip at SVC/RA junction.  Catheter is ready to use.  No immediate complication.

## 2015-03-16 NOTE — Progress Notes (Signed)
CRITICAL VALUE ALERT  Critical value received:  Hemoglobin 6.4  Date of notification:  03/16/2015   Time of notification:  1953  Critical value read back: yes  Nurse who received alert:  Reatha Armour RN  MD notified (1st page):  Chaney Malling  Time of first page:  8:25 PM   MD notified (2nd page):  Time of second page: 9:40 PM   Responding MD:    Time MD responded:  2142

## 2015-03-16 NOTE — Consult Note (Addendum)
WOC ostomy consult note Pt is familiar to [facility] team from previous admissions, last seen in August.  Colostomy has been in place for many years Stoma type/location: LLQ colostomy Stomal assessment/size: Red and viable, above skin level, 1 1/4 inches Peristomal assessment: intact skin surrounding Output: Large dark brown liquid stool Ostomy pouching: 2pc.  Education provided: Current pouch very full and had leaked onto barrier.  Applied new 2 piece system and emptied approx 75cc liquid stool. Supplies ordered to bedside and nursing orders for routine care of ostomy with twice weekly changes.  WOC wound consult note Patient is followed by Dr. [name] (Plastics) as an outpatient. It is recommend that he continue to follow up with her for direction and POC in wound care following discharge. Patient is followed by [place] and has a paid caregiver in the home. The Stage 4 pressure injuries presenting today are chronic, non-healing and exist in an extensive area of skin loss that has numerous skin grafts and flaps as well as previous wound healing with pink dry scar tissue intact in some areas. The expected anatomical landmarks are difficult to locate or use as reference points. Reason for Consult: Consult requested for multiple chronic wounds. Wound type:Pressure injuries Pressure Ulcer POA: Yes Measurement: All measurements are approximations, since it is difficult to determine locations since they are almost all connected together and pt cannot be held over for very long to obtain accurate measurements. Sacrum: 12X14 x 1 cm, 90% red, 10% patchy areas of eschar; mod amt tan drainage, no odor: Stage 4 Coccyx: 10cm x 78m x 1cm, 90% red, 10% patchy areas of eschar; mod amt tan drainage, no odor: Stage 4 Right groin: 4X4X1cm. Red, moist wound bed, mod amt tan drainage, no odor: full thickness Right ischium: 4cm x 4cm x 6cm. Red, moist wound bed, mod amt tan drainage, no odor: Stage 4 Left  ischium: 5cm x 10cm x 5cm Red, moist wound bed, mod amt tan drainage, no odor: Stage 4 Left upper hip:1X1cm 100% slough, no odor or drainage: Unstageable Left inner abd skin fold: partial thickness skin less fissure, pink and moist, 4X.1X.1cm, sm amt yellow drainage, no odo.r Left heel: 4cm x 2X.2cm area, 90% red, 20% patchy slough, bone palpable, sm amt tan drainage, no odor: Stage 4 Right plantar foot: .8X.8cm, 100% slough, no odor or drainage: unstageable Left hand: .5X.5X.3cm, 50% red, 50% yellow slough, sm amt yellow drainage, no odor: Stage 3 device R/T hand splint. Dressing procedure/placement/frequency: Air mattress ordered for pressure reduction, and nutrition consult has already been ordered to optimize nutrition. Float heels to reduce pressure. Daily saline dressings have been previously ordered to the extensive wounds; continue present plan of care.  Santyl ointment for chemical debridement of nonviable tissue to left hip, left heel, right foot, and left hand.  Pt can resume follow-up with plastic surgery team after discharge. Pt is well-informed regarding topical treatment and verbalizes understanding.  More than one hour spent performing this consult. Please re-consult if further assistance is needed.  Thank-you,  Julien Girt MSN, [place], [place], [place], [place]

## 2015-03-16 NOTE — ED Notes (Signed)
Attempted report X1

## 2015-03-17 ENCOUNTER — Inpatient Hospital Stay (HOSPITAL_COMMUNITY): Payer: Medicare Other

## 2015-03-17 DIAGNOSIS — D500 Iron deficiency anemia secondary to blood loss (chronic): Secondary | ICD-10-CM

## 2015-03-17 DIAGNOSIS — T83518A Infection and inflammatory reaction due to other urinary catheter, initial encounter: Secondary | ICD-10-CM | POA: Diagnosis not present

## 2015-03-17 DIAGNOSIS — M86651 Other chronic osteomyelitis, right thigh: Secondary | ICD-10-CM

## 2015-03-17 DIAGNOSIS — I10 Essential (primary) hypertension: Secondary | ICD-10-CM

## 2015-03-17 DIAGNOSIS — G4733 Obstructive sleep apnea (adult) (pediatric): Secondary | ICD-10-CM

## 2015-03-17 DIAGNOSIS — R8290 Unspecified abnormal findings in urine: Secondary | ICD-10-CM

## 2015-03-17 LAB — URINE CULTURE

## 2015-03-17 LAB — CBC
HEMATOCRIT: 25.6 % — AB (ref 39.0–52.0)
HEMOGLOBIN: 7.8 g/dL — AB (ref 13.0–17.0)
MCH: 24.4 pg — ABNORMAL LOW (ref 26.0–34.0)
MCHC: 30.5 g/dL (ref 30.0–36.0)
MCV: 80 fL (ref 78.0–100.0)
Platelets: 495 10*3/uL — ABNORMAL HIGH (ref 150–400)
RBC: 3.2 MIL/uL — ABNORMAL LOW (ref 4.22–5.81)
RDW: 16 % — AB (ref 11.5–15.5)
WBC: 15.9 10*3/uL — AB (ref 4.0–10.5)

## 2015-03-17 LAB — BASIC METABOLIC PANEL
ANION GAP: 9 (ref 5–15)
BUN: 20 mg/dL (ref 6–20)
CHLORIDE: 109 mmol/L (ref 101–111)
CO2: 26 mmol/L (ref 22–32)
Calcium: 8.4 mg/dL — ABNORMAL LOW (ref 8.9–10.3)
Creatinine, Ser: 0.62 mg/dL (ref 0.61–1.24)
GFR calc Af Amer: >60 mL/min (ref >60)
GLUCOSE: 119 mg/dL — AB (ref 65–99)
POTASSIUM: 3.4 mmol/L — AB (ref 3.5–5.1)
Sodium: 144 mmol/L (ref 135–145)

## 2015-03-17 LAB — MRSA PCR SCREENING: MRSA by PCR: INVALID — AB

## 2015-03-17 LAB — PREPARE RBC (CROSSMATCH)

## 2015-03-17 MED ORDER — BACLOFEN 10 MG PO TABS
10.0000 mg | ORAL_TABLET | Freq: Four times a day (QID) | ORAL | Status: DC
  Administered 2015-03-17 – 2015-03-20 (×13): 10 mg via ORAL
  Filled 2015-03-17 (×13): qty 1

## 2015-03-17 MED ORDER — SODIUM CHLORIDE 0.9 % IV BOLUS (SEPSIS)
500.0000 mL | Freq: Once | INTRAVENOUS | Status: AC
  Administered 2015-03-17: 500 mL via INTRAVENOUS

## 2015-03-17 MED ORDER — POTASSIUM CHLORIDE CRYS ER 20 MEQ PO TBCR
40.0000 meq | EXTENDED_RELEASE_TABLET | Freq: Once | ORAL | Status: AC
  Administered 2015-03-17: 40 meq via ORAL
  Filled 2015-03-17: qty 2

## 2015-03-17 MED ORDER — ONDANSETRON HCL 4 MG/2ML IJ SOLN: 4.0000 mg | Freq: Three times a day (TID) | INTRAMUSCULAR | Status: DC | PRN

## 2015-03-17 NOTE — Progress Notes (Signed)
03/17/2015 influenza vaccine given in left deltoid at 1015 am. Lot #[id number] and Expire date 10/28/15. [employer] claims handler.

## 2015-03-17 NOTE — Procedures (Signed)
Pt placed on hospital CPAP with home setting.  Pt is tolerating well and resting comfortably.

## 2015-03-17 NOTE — Progress Notes (Signed)
    [place] for Infectious Disease   Reason for visit: Follow up on urinary tract infection  Interval History: he has a stable WBC, afebrile, feels somewhat better.  No chills.  Urine culture pending.  Was seen by [facility] and has multiple wounds, though no obvious infection.    Physical Exam: Constitutional:  Filed Vitals:   03/17/15 0800  BP: 160/110  Pulse:   Temp:   Resp: 18  ; patient appears in NAD Eyes: anicteric HENT: no thrush Respiratory: Normal respiratory effort; CTA B Cardiovascular: RRR  Constitutional: negative for chills Gastrointestinal: negative for diarrhea  Lab Results  Component Value Date   WBC 15.9* 03/17/2015   HGB 7.8* 03/17/2015   HCT 25.6* 03/17/2015   MCV 80.0 03/17/2015   PLT 495* 03/17/2015    Lab Results  Component Value Date   CREATININE 0.80 03/15/2015   BUN 27* 03/15/2015   NA 141 03/15/2015   K 4.0 03/15/2015   CL 103 03/15/2015   CO2 26 03/15/2015    Lab Results  Component Value Date   ALT 11* 03/15/2015   AST 14* 03/15/2015   ALKPHOS 74 03/15/2015     Microbiology: Recent Results (from the past 240 hour(s))  MRSA PCR Screening     Status: Abnormal   Collection Time: 03/16/15  9:57 PM  Result Value Ref Range Status   MRSA by PCR INVALID RESULTS, SPECIMEN SENT FOR CULTURE (A) NEGATIVE Final    Comment: CALLED TO GARCIA,K RN [id number] 03/17/15 MITCHELL,L        The GeneXpert MRSA Assay (FDA approved for NASAL specimens only), is one component of a comprehensive MRSA colonization surveillance program. It is not intended to diagnose MRSA infection nor to guide or monitor treatment for MRSA infections.   MRSA culture     Status: None (Preliminary result)   Collection Time: 03/16/15  9:57 PM  Result Value Ref Range Status   Specimen Description NASAL SWAB  Final   Special Requests NONE  Final   Culture   Final    NO SUSPICIOUS COLONIES, CONTINUING TO HOLD Performed at [facility]    Report Status PENDING   Incomplete    Impression:  1. Probable UTI 2.  History of MDROs  Plan: 1.  Continue with zosyn pending ID 2. Isolation for MDRO 3. Change suprapubic catheter

## 2015-03-17 NOTE — Procedures (Signed)
Pt placed on CPAP.  Tolerating well and resting comfortably.

## 2015-03-17 NOTE — Progress Notes (Signed)
03/17/2015 no urine noted in super pubic cath bag, was draining around site. Rn call rehab at [place]  to made them aware and let Misty RN from [facility] to consult rehab about the super pubic cath no urine in bag. [facility] RN.

## 2015-03-17 NOTE — Procedures (Signed)
RT notified by RN due to constant desats by pt.  RT placed 3L inline with CPAP.

## 2015-03-17 NOTE — Clinical Documentation Improvement (Signed)
Infectious Disease  A cause and effect relationship may not be assumed and must be documented by a provider.  Please clarify the relationship, if any, between Sepsis/ UTI and Suprapubic Catheter.  Are the conditions:  Due to or associated with each other  Unrelated to each other  Other  Clinically Undetermined  Please update your documentation within the medical record to reflect your response to this query.   Supporting Information (As per notes):"paraplegia, indwelling suprapubic catheter" & "Sepsis/UTI" ED vitals: BP 93/36 mmHg  Pulse 114  Resp 19  SpO2 100%  Please exercise your independent, professional judgment when responding. A specific answer is not anticipated or expected.  Thank You, Alessandra Grout, RN, BSN, CCDS,Clinical Documentation Specialist:  [phone]  [phone]=[facility] [place]- Health Information Management

## 2015-03-17 NOTE — Progress Notes (Signed)
03/17/2015 8421 patient systolic blood pressure was 80's to 03'X and diastolic blood pressure was 50 to 60. Patient was also complaining of dizziness. Notified Dr Candiss Norse order was given for 500 cc bolus normal Saline. Blood pressure was reassess after bolus and systolic blood pressure was in the 80's. Dr Candiss Norse was made aware. [facility] RN.

## 2015-03-17 NOTE — Progress Notes (Addendum)
Patient Demographics:    [name] [name], is a 49 y.o. male, DOB - 1966/01/15, LPF:[id number]  Admit date - 03/15/2015   Admitting Physician Waldemar Dickens, MD  Outpatient Primary MD for the patient is [name], MD  LOS - 1   Chief Complaint  Patient presents with  . Emesis  . Weakness        Subjective:    [name] [name] today has, No headache, No chest pain, No abdominal pain - No Nausea, No new weakness tingling or numbness, No Cough - SOB.     Assessment  & Plan :      1. Sepsis due to UTI causing nausea vomiting in a patient with Chr.Suprapubic catheter . ID on board, empiric Zosyn being continued, suprapubic catheter to be changed, monitor cultures further recommendation per ID for sepsis.  2. Nausea vomiting. Likely due to #1 above, CT scan abdomen and pelvis shows no obstruction, clear liquid diet and monitor.   3. 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 Kindly see wound care consult note for all details. For now supportive care, he follows with plastic surgeon Dr. Jamal Collin and will continue to do so post discharge. Air overlay bed provided.   4. Constipation. After enemas in ED has ostomy output, will place on bowel regimen and monitor. For now clear liquid diet.   5. Chronic microcytic anemia. No need for transfusion, on PPI, outpatient workup by PCP. Chronically on iron supplementation.   6. GERD. On PPI continue.   7. OSA. C Pap at night continue.    Code Status : Full code  Family Communication  : None present  Disposition Plan  : SNF in a few days  Consults  :  ID, wound care  Procedures  :  CT abdomen and pelvis unremarkable, PICC line placement  DVT Prophylaxis  :  Heparin   Lab Results  Component  Value Date   PLT 495* 03/17/2015    Inpatient Medications  Scheduled Meds: . baclofen  20 mg Oral QID  . collagenase   Topical Daily  . feeding supplement (PRO-STAT SUGAR FREE 64)  30 mL Oral BID  . heparin  5,000 Units Subcutaneous 3 times per day  . lactose free nutrition  237 mL Oral BID BM  . lactose free nutrition  237 mL Oral TID BM  . pantoprazole  40 mg Oral BID AC  . piperacillin-tazobactam (ZOSYN)  IV  3.375 g Intravenous Q8H  . potassium chloride  40 mEq Oral Once  . sodium chloride  1,000 mL Intravenous Once  . sodium chloride  10-40 mL Intracatheter Q12H  . sodium chloride  3 mL Intravenous Q12H  . sucralfate  1 g Oral QID   Continuous Infusions: . sodium chloride 100 mL/hr at 03/17/15 0931   PRN Meds:.acetaminophen **OR** acetaminophen, docusate sodium, iohexol, ondansetron (ZOFRAN) IV, promethazine, sodium chloride  Antibiotics  :     Anti-infectives    Start     Dose/Rate Route Frequency Ordered Stop   03/16/15 1800  vancomycin (VANCOCIN) 1,750 mg in sodium chloride 0.9 % 500 mL IVPB  Status:  Discontinued     1,750 mg 250 mL/hr over 120 Minutes Intravenous  Once 03/16/15 1015  03/16/15 1037   03/16/15 1750  vancomycin (VANCOCIN) IVPB 1000 mg/200 mL premix  Status:  Discontinued     1,000 mg 200 mL/hr over 60 Minutes Intravenous  Once 03/16/15 1014 03/16/15 1015   03/16/15 1600  piperacillin-tazobactam (ZOSYN) IVPB 3.375 g     3.375 g 12.5 mL/hr over 240 Minutes Intravenous Every 8 hours 03/16/15 1411     03/16/15 1200  imipenem-cilastatin (PRIMAXIN) 500 mg in sodium chloride 0.9 % 100 mL IVPB  Status:  Discontinued     500 mg 200 mL/hr over 30 Minutes Intravenous Every 6 hours 03/16/15 1049 03/16/15 1405   03/16/15 1015  vancomycin (VANCOCIN) IVPB 1000 mg/200 mL premix  Status:  Discontinued     1,000 mg 200 mL/hr over 60 Minutes Intravenous  Once 03/16/15 1009 03/16/15 1014   03/16/15 0945  piperacillin-tazobactam (ZOSYN) IVPB 3.375 g     3.375 g 100  mL/hr over 30 Minutes Intravenous  Once 03/16/15 0939 03/16/15 1130        Objective:   Filed Vitals:   03/17/15 0559 03/17/15 0600 03/17/15 0700 03/17/15 0800  BP: 88/62 107/66 111/52 160/110  Pulse:   103   Temp: 97.7 F (36.5 C)  98 F (36.7 C)   TempSrc: Oral  Oral   Resp: 25  18 18   Height:      Weight:      SpO2: 99%  97%     Wt Readings from Last 3 Encounters:  03/16/15 88.6 kg (195 lb 5.2 oz)  01/21/15 92.987 kg (205 lb)  12/08/14 93 kg (205 lb 0.4 oz)     Intake/Output Summary (Last 24 hours) at 03/17/15 1239 Last data filed at 03/17/15 1324  Gross per 24 hour  Intake 1358.33 ml  Output    400 ml  Net 958.33 ml     Physical Exam  Awake Alert, Oriented X 3, No new F.N deficits, multiple chronic contractures, Normal affect [place].AT,PERRAL Supple Neck,No JVD, No cervical lymphadenopathy appriciated.  Symmetrical Chest wall movement, Good air movement bilaterally, CTAB RRR,No Gallops,Rubs or new Murmurs, No Parasternal Heave +ve B.Sounds, Abd Soft, No tenderness, No organomegaly appriciated, No rebound - guarding or rigidity. Colostomy in place. No Cyanosis, Clubbing or edema, No new Rash or bruise  Multiple sacral, coccyx, bilateral feet decubitus ulcers kindly see wound care notes for all details The pubic catheter in place    Data Review:   Micro Results Recent Results (from the past 240 hour(s))  Urine culture     Status: None   Collection Time: 03/16/15  8:35 AM  Result Value Ref Range Status   Specimen Description URINE, RANDOM  Final   Special Requests ADDED [id number]  Final   Culture MULTIPLE SPECIES PRESENT, SUGGEST RECOLLECTION  Final   Report Status 03/17/2015 FINAL  Final  MRSA PCR Screening     Status: Abnormal   Collection Time: 03/16/15  9:57 PM  Result Value Ref Range Status   MRSA by PCR INVALID RESULTS, SPECIMEN SENT FOR CULTURE (A) NEGATIVE Final    Comment: CALLED TO GARCIA,K RN 0040 03/17/15 MITCHELL,L        The GeneXpert MRSA  Assay (FDA approved for NASAL specimens only), is one component of a comprehensive MRSA colonization surveillance program. It is not intended to diagnose MRSA infection nor to guide or monitor treatment for MRSA infections.   MRSA culture     Status: None (Preliminary result)   Collection Time: 03/16/15  9:57 PM  Result Value  Ref Range Status   Specimen Description NASAL SWAB  Final   Special Requests NONE  Final   Culture   Final    NO SUSPICIOUS COLONIES, CONTINUING TO HOLD Performed at [facility]    Report Status PENDING  Incomplete    Radiology Reports Ct Abdomen Pelvis Wo Contrast  03/16/2015  CLINICAL DATA:  Nausea and vomiting. Abdominal pain. Quadriplegia, stage IV decubitus ulcers. EXAM: CT ABDOMEN AND PELVIS WITHOUT CONTRAST TECHNIQUE: Multidetector CT imaging of the abdomen and pelvis was performed following the standard protocol without IV contrast. COMPARISON:  CT abdomen and pelvis December 08, 2014 FINDINGS: LUNG BASES: Similar LEFT lung base pleural thickening, old LEFT rib fractures. Minimal RIGHT pleural thickening is unchanged. Included heart size is normal, no pericardial effusions. KIDNEYS/BLADDER: Kidneys are orthotopic, demonstrating normal size and morphology. No nephrolithiasis, hydronephrosis; limited assessment for renal masses on this nonenhanced examination. The unopacified ureters are normal in course and caliber. Urinary bladder is decompressed, via suprapubic catheter. SOLID ORGANS: The liver, spleen, gallbladder, pancreas are unremarkable for this non-contrast examination. Nodular thickening of the bilateral funeral glands, suggesting hyperplasia. GASTROINTESTINAL TRACT: Small to moderate hiatal hernia. The stomach, small and large bowel are normal in course and caliber without inflammatory changes, the sensitivity may be decreased by lack of enteric contrast. LEFT lower quadrant colostomy. Moderate amount of retained large bowel stool in pouch.  PERITONEUM/RETROPERITONEUM: Aortoiliac vessels are normal in course and caliber, mild calcific atherosclerosis. No lymphadenopathy by CT size criteria. Internal reproductive organs are unremarkable. No intraperitoneal free fluid nor free air. SOFT TISSUES/ OSSEOUS STRUCTURES: Destructive changes of bilateral hips, and pelvic bones with heterotopic ossification, RIGHT hip intra-articular gas and multiple ulcers/sinus tracts. IMPRESSION: No urolithiasis, obstructive uropathy nor acute intra-abdominal/pelvic process. 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 LEFT lower quadrant colostomy. However, moderate amount of retained large bowel stool within pouch. This is likely residual fecal material though, if there is a clinical concern for fistula, rectal contrast may be indicated. Electronically Signed   By: Elon Alas M.D.   On: 03/16/2015 02:44   Abd 1 View (kub)  03/17/2015  CLINICAL DATA:  Constipation. EXAM: ABDOMEN - 1 VIEW COMPARISON:  CT 03/16/2015.  KUB 03/15/2015. FINDINGS: Prominent amount of stool remains in the left colon and rectosigmoid. Mild small large bowel distention. Adynamic ileus cannot be excluded. Moderate gastric distention. Degenerative changes lumbar spine. Deformity of the hips again noted. IMPRESSION: 1. Prominent stool remains in the left colon and rectosigmoid suggesting constipation. 2. Slightly prominent loops of small and large bowel. Adynamic ileus cannot be excluded. 3. Mild gastric distention. Electronically Signed   By: Marcello Moores  Register   On: 03/17/2015 07:38   Ir Fluoro Guide Cv Line Left  03/16/2015  CLINICAL DATA:  49 year old with recurrent urinary tract infections and poor venous access. EXAM: PLACEMENT OF PERIPHERALLY INSERTED CENTRAL VENOUS CATHETER (PICC) WITH ULTRASOUND AND FLUOROSCOPIC GUIDANCE FLUOROSCOPY TIME:  3.0 minutes, 34.5 mGy TECHNIQUE: The procedure was explained to the  patient. The risks and benefits of the procedure were discussed and the patient's questions were addressed. Informed consent was obtained from the patient. The left arm was prepped with chlorhexidine, draped in the usual sterile fashion using maximum barrier technique (cap and mask, sterile gown, sterile gloves, large sterile sheet, hand hygiene and cutaneous antiseptic). Local anesthesia was attained by infiltration with 1% lidocaine. Ultrasound demonstrated patency of the left brachial vein, and this was documented with an image. Under  real-time ultrasound guidance, this vein was accessed with a 21 gauge micropuncture needle and image documentation was performed. The needle was exchanged over a guidewire for a peel-away sheath. A 49 cm dual lumen Power PICC line was advanced through the sheath but would not easily advance beyond the axilla. Therefore, contrast was injected to visualize the venous anatomy. Eventually, catheter was advanced central to the left axillary vein by using a micro wire through each lumen. Catheter tip was eventually placed at the superior cavoatrial junction. Catheter was sutured to the skin. Findings: There is a patent left brachial vein. There appears to be stenosis at the left axillary line likely related to prior PICC lines. Catheter tip at the superior cavoatrial junction. Estimated blood loss: Minimal CONTRAST:  5 mL Omnipaque 546 COMPLICATIONS: None.  The patient tolerated the procedure well. IMPRESSION: Successful placement of a left PICC line with ultrasound and fluoroscopic guidance. PICC line placement was successful but the placement was difficult due to narrowing in the left axillary vein and likely related to prior line placements. Placement of a left arm PICC line in the future may be difficult. Electronically Signed   By: Markus Daft M.D.   On: 03/16/2015 17:34   Ir US Guide Vasc Access Left  03/16/2015  CLINICAL DATA:  49 year old with recurrent urinary tract infections  and poor venous access. EXAM: PLACEMENT OF PERIPHERALLY INSERTED CENTRAL VENOUS CATHETER (PICC) WITH ULTRASOUND AND FLUOROSCOPIC GUIDANCE FLUOROSCOPY TIME:  3.0 minutes, 34.5 mGy TECHNIQUE: The procedure was explained to the patient. The risks and benefits of the procedure were discussed and the patient's questions were addressed. Informed consent was obtained from the patient. The left arm was prepped with chlorhexidine, draped in the usual sterile fashion using maximum barrier technique (cap and mask, sterile gown, sterile gloves, large sterile sheet, hand hygiene and cutaneous antiseptic). Local anesthesia was attained by infiltration with 1% lidocaine. Ultrasound demonstrated patency of the left brachial vein, and this was documented with an image. Under real-time ultrasound guidance, this vein was accessed with a 21 gauge micropuncture needle and image documentation was performed. The needle was exchanged over a guidewire for a peel-away sheath. A 49 cm dual lumen Power PICC line was advanced through the sheath but would not easily advance beyond the axilla. Therefore, contrast was injected to visualize the venous anatomy. Eventually, catheter was advanced central to the left axillary vein by using a micro wire through each lumen. Catheter tip was eventually placed at the superior cavoatrial junction. Catheter was sutured to the skin. Findings: There is a patent left brachial vein. There appears to be stenosis at the left axillary line likely related to prior PICC lines. Catheter tip at the superior cavoatrial junction. Estimated blood loss: Minimal CONTRAST:  5 mL Omnipaque 568 COMPLICATIONS: None.  The patient tolerated the procedure well. IMPRESSION: Successful placement of a left PICC line with ultrasound and fluoroscopic guidance. PICC line placement was successful but the placement was difficult due to narrowing in the left axillary vein and likely related to prior line placements. Placement of a left arm  PICC line in the future may be difficult. Electronically Signed   By: Markus Daft M.D.   On: 03/16/2015 17:34   Dg Chest Port 1 View  03/16/2015  CLINICAL DATA:  Sepsis.  Nausea and vomiting. EXAM: PORTABLE CHEST 1 VIEW COMPARISON:  03/15/2015. Multiple previous chest radiographs. Chest CT 10/26/2014 FINDINGS: Artifact overlies chest. Heart and mediastinal shadows are normal. There slight worsening of volume loss in  the left lower lobe compared to the previous study. Lytic change of the right posterior fifth rib with areas of adjacent calcification probably relate to pressure erosion of the rib because in adjacent exostosis from the scapula. There could be local symptoms in this region related to that. IMPRESSION: Slight worsening of volume loss in the left lower lobe since yesterday. Chronic lucent bone changes of the right posterior fifth rib and soft tissues presumably related to exostosis of the scapula with chronic chest wall changes. Electronically Signed   By: Nelson Chimes M.D.   On: 03/16/2015 10:36   Dg Abd Acute W/chest  03/15/2015  CLINICAL DATA:  Nausea for one day; quadriplegia EXAM: DG ABDOMEN ACUTE W/ 1V CHEST COMPARISON:  None. FINDINGS: Nonobstructive gas pattern. Moderate fecal retention. Mild stool distension of the rectum. Severe chronic bilateral hip dysplasia.No free air. IMPRESSION: Constipation.  No acute findings. Electronically Signed   By: Skipper Cliche M.D.   On: 03/15/2015 17:03     CBC  Recent Labs Lab 03/15/15 2259 03/16/15 1912 03/17/15 0835  WBC  --  14.9* 15.9*  HGB 9.2* 6.4* 7.8*  HCT 27.0* 22.1* 25.6*  PLT  --  589* 495*  MCV  --  77.8* 80.0  MCH  --  22.5* 24.4*  MCHC  --  29.0* 30.5  RDW  --  16.3* 16.0*  LYMPHSABS  --  0.7  --   MONOABS  --  1.1*  --   EOSABS  --  0.5  --   BASOSABS  --  0.0  --     Chemistries   Recent Labs Lab 03/15/15 2255 03/15/15 2258 03/15/15 2259 03/17/15 0835  NA 137  --  141 144  K 3.7  --  4.0 3.4*  CL 100*   --  103 109  CO2 26  --   --  26  GLUCOSE 118*  --  118* 119*  BUN 22*  --  27* 20  CREATININE 0.83 0.90 0.80 0.62  CALCIUM 8.2*  --   --  8.4*  AST 14*  --   --   --   ALT 11*  --   --   --   ALKPHOS 74  --   --   --   BILITOT 0.5  --   --   --    ------------------------------------------------------------------------------------------------------------------ estimated creatinine clearance is 120.3 mL/min (by C-G formula based on Cr of 0.62). ------------------------------------------------------------------------------------------------------------------ No results for input(s): HGBA1C in the last 72 hours. ------------------------------------------------------------------------------------------------------------------ No results for input(s): CHOL, HDL, LDLCALC, TRIG, CHOLHDL, LDLDIRECT in the last 72 hours. ------------------------------------------------------------------------------------------------------------------ No results for input(s): TSH, T4TOTAL, T3FREE, THYROIDAB in the last 72 hours.  Invalid input(s): FREET3 ------------------------------------------------------------------------------------------------------------------ No results for input(s): VITAMINB12, FOLATE, FERRITIN, TIBC, IRON, RETICCTPCT in the last 72 hours.  Coagulation profile  Recent Labs Lab 03/16/15 1912  INR 1.35    No results for input(s): DDIMER in the last 72 hours.  Cardiac Enzymes No results for input(s): CKMB, TROPONINI, MYOGLOBIN in the last 168 hours.  Invalid input(s): CK ------------------------------------------------------------------------------------------------------------------ Invalid input(s): POCBNP   Time Spent in minutes   35   Cloyd Ragas K M.D on 03/17/2015 at 12:39 PM  Between 7am to 7pm - Pager - [phone]  After 7pm go to [email or web address] - password [facility]  [facility] -  Office  [phone]

## 2015-03-18 DIAGNOSIS — N390 Urinary tract infection, site not specified: Secondary | ICD-10-CM

## 2015-03-18 DIAGNOSIS — Z9359 Other cystostomy status: Secondary | ICD-10-CM

## 2015-03-18 DIAGNOSIS — K5900 Constipation, unspecified: Secondary | ICD-10-CM

## 2015-03-18 DIAGNOSIS — T83518D Infection and inflammatory reaction due to other urinary catheter, subsequent encounter: Secondary | ICD-10-CM

## 2015-03-18 LAB — MRSA CULTURE

## 2015-03-18 LAB — CBC
HEMATOCRIT: 23.4 % — AB (ref 39.0–52.0)
HEMOGLOBIN: 7.1 g/dL — AB (ref 13.0–17.0)
MCH: 24.3 pg — ABNORMAL LOW (ref 26.0–34.0)
MCHC: 30.3 g/dL (ref 30.0–36.0)
MCV: 80.1 fL (ref 78.0–100.0)
Platelets: 449 10*3/uL — ABNORMAL HIGH (ref 150–400)
RBC: 2.92 MIL/uL — AB (ref 4.22–5.81)
RDW: 16.6 % — ABNORMAL HIGH (ref 11.5–15.5)
WBC: 9.3 10*3/uL (ref 4.0–10.5)

## 2015-03-18 LAB — BASIC METABOLIC PANEL
Anion gap: 5 (ref 5–15)
BUN: 12 mg/dL (ref 6–20)
CHLORIDE: 106 mmol/L (ref 101–111)
CO2: 26 mmol/L (ref 22–32)
Calcium: 8.5 mg/dL — ABNORMAL LOW (ref 8.9–10.3)
Creatinine, Ser: 0.57 mg/dL — ABNORMAL LOW (ref 0.61–1.24)
GFR calc non Af Amer: >60 mL/min (ref >60)
Glucose, Bld: 100 mg/dL — ABNORMAL HIGH (ref 65–99)
POTASSIUM: 3.3 mmol/L — AB (ref 3.5–5.1)
SODIUM: 137 mmol/L (ref 135–145)

## 2015-03-18 LAB — FOLATE: FOLATE: 9.4 ng/mL (ref >5.9)

## 2015-03-18 LAB — IRON AND TIBC
IRON: 15 ug/dL — AB (ref 45–182)
Saturation Ratios: 13 % — ABNORMAL LOW (ref 17.9–39.5)
TIBC: 119 ug/dL — AB (ref 250–450)
UIBC: 104 ug/dL

## 2015-03-18 LAB — RETICULOCYTES
RBC.: 2.93 MIL/uL — ABNORMAL LOW (ref 4.22–5.81)
RETIC CT PCT: 2.4 % (ref 0.4–3.1)
Retic Count, Absolute: 70.3 10*3/uL (ref 19.0–186.0)

## 2015-03-18 LAB — MAGNESIUM: MAGNESIUM: 1.8 mg/dL (ref 1.7–2.4)

## 2015-03-18 LAB — FERRITIN: Ferritin: 751 ng/mL — ABNORMAL HIGH (ref 24–336)

## 2015-03-18 LAB — VITAMIN B12: Vitamin B-12: 1387 pg/mL — ABNORMAL HIGH (ref 180–914)

## 2015-03-18 MED ORDER — MAGNESIUM SULFATE IN D5W 10-5 MG/ML-% IV SOLN
1.0000 g | Freq: Once | INTRAVENOUS | Status: AC
  Administered 2015-03-18: 1 g via INTRAVENOUS
  Filled 2015-03-18: qty 100

## 2015-03-18 MED ORDER — SODIUM CHLORIDE 0.9 % IV BOLUS (SEPSIS)
500.0000 mL | Freq: Once | INTRAVENOUS | Status: AC
  Administered 2015-03-18: 500 mL via INTRAVENOUS

## 2015-03-18 MED ORDER — SODIUM CHLORIDE 0.9 % IV SOLN
INTRAVENOUS | Status: DC
  Administered 2015-03-18 (×2): via INTRAVENOUS

## 2015-03-18 MED ORDER — DARIFENACIN HYDROBROMIDE ER 15 MG PO TB24
15.0000 mg | ORAL_TABLET | Freq: Every day | ORAL | Status: DC
  Administered 2015-03-18 – 2015-03-20 (×3): 15 mg via ORAL
  Filled 2015-03-18 (×3): qty 1

## 2015-03-18 MED ORDER — POTASSIUM CHLORIDE 10 MEQ/100ML IV SOLN
10.0000 meq | INTRAVENOUS | Status: AC
  Administered 2015-03-18 (×4): 10 meq via INTRAVENOUS
  Filled 2015-03-18 (×4): qty 100
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
## 2015-03-18 NOTE — Progress Notes (Signed)
No urine output from suprapubic catheter has been collected since catheter was changed at 1622 on 11/01. Urine is leaking around insertion site. Bladder scan showed 189cc. [place] floor was called to assess the suprapubic catheter which is the unit that are certified to remove/insert suprapubic catheters. [facility] staff said they were unable change catheter at this time due to not knowing the size of the previous catheter. Rapid response irrigated and 10cc of red fluid with small clots of blood was removed from catheter tubing. Currently, the pt's abdomen has disposable chucks to keep the urine from leaking into pt's wounds. Schorr, NP notified. Will continue to monitor.

## 2015-03-18 NOTE — Progress Notes (Signed)
    [place] [facility] for Infectious Disease   Reason for visit: Follow up on complicated UTI  Interval History: urine culture is multiple organisms.  WBC has normalized, afebrile.  History of MDRO Pseudomonas.   Physical Exam: Constitutional:  Filed Vitals:   03/18/15 1600  BP: 77/52  Pulse: 88  Temp: 98.5 F (36.9 C)  Resp: 26  ; patient appears in NAD Eyes: anicteric HENT: no thrush Respiratory: Normal respiratory effort; CTA B Cardiovascular: RRR  Constitutional: negative for fevers and chills Gastrointestinal: negative for diarrhea  Lab Results  Component Value Date   WBC 9.3 03/18/2015   HGB 7.1* 03/18/2015   HCT 23.4* 03/18/2015   MCV 80.1 03/18/2015   PLT 449* 03/18/2015    Lab Results  Component Value Date   CREATININE 0.57* 03/18/2015   BUN 12 03/18/2015   NA 137 03/18/2015   K 3.3* 03/18/2015   CL 106 03/18/2015   CO2 26 03/18/2015    Lab Results  Component Value Date   ALT 11* 03/15/2015   AST 14* 03/15/2015   ALKPHOS 74 03/15/2015     Microbiology: Recent Results (from the past 240 hour(s))  Urine culture     Status: None   Collection Time: 03/16/15  8:35 AM  Result Value Ref Range Status   Specimen Description URINE, RANDOM  Final   Special Requests ADDED [id number]  Final   Culture MULTIPLE SPECIES PRESENT, SUGGEST RECOLLECTION  Final   Report Status 03/17/2015 FINAL  Final  Culture, blood (routine x 2)     Status: None (Preliminary result)   Collection Time: 03/16/15  7:12 PM  Result Value Ref Range Status   Specimen Description BLOOD RIGHT ARM  Final   Special Requests IN PEDIATRIC BOTTLE 1CC  Final   Culture NO GROWTH 2 DAYS  Final   Report Status PENDING  Incomplete  Culture, blood (routine x 2)     Status: None (Preliminary result)   Collection Time: 03/16/15  7:23 PM  Result Value Ref Range Status   Specimen Description BLOOD RIGHT PICC LINE  Final   Special Requests BOTTLES DRAWN AEROBIC AND ANAEROBIC 10CC  Final   Culture NO  GROWTH 2 DAYS  Final   Report Status PENDING  Incomplete  MRSA PCR Screening     Status: Abnormal   Collection Time: 03/16/15  9:57 PM  Result Value Ref Range Status   MRSA by PCR INVALID RESULTS, SPECIMEN SENT FOR CULTURE (A) NEGATIVE Final    Comment: CALLED TO GARCIA,K RN 0040 03/17/15 MITCHELL,L        The GeneXpert MRSA Assay (FDA approved for NASAL specimens only), is one component of a comprehensive MRSA colonization surveillance program. It is not intended to diagnose MRSA infection nor to guide or monitor treatment for MRSA infections.   MRSA culture     Status: None   Collection Time: 03/16/15  9:57 PM  Result Value Ref Range Status   Specimen Description NASAL SWAB  Final   Special Requests NONE  Final   Culture NOMRSA Performed at [facility]   Final   Report Status 03/18/2015 FINAL  Final    Impression:  1. Complicated UTI, culture negative, from suprapubic catheter as the source, now changed. 2.  Suprapubic catheter with leaking.   3. quadriplegia  Plan: 1. Continue zosyn for 7 more days from today through Nov 8th based on previous cultures.

## 2015-03-18 NOTE — Progress Notes (Signed)
Text page results of bladder scan to DR Candiss Norse Results was 0 in 4 area of Bladder

## 2015-03-18 NOTE — Progress Notes (Signed)
Patient's primary RN called 4W for assistance with suprapubic catheter as it was leaking around site.  4W charge RN and another floor RN went to assess catheter; checked balloon and site leakage, redressed site, no urine output noted. Bladder scanned prior to arrival for 120ml.  Primary RN consulted rapid response for further assistance.  Catheter irrigated with 30cc saline, 35cc bloody urine noted with minimal sediment.  Rapid Response RN consulted with [facility] and staff agree to further assess in morning.  Site redressed prior to leaving.  Primary RN to continue monitoring.

## 2015-03-18 NOTE — Progress Notes (Signed)
Pt is experiencing some confusion tonight. Pt keeps saying, "I have too many tires. Help me with these tires". Pt was able to follow commands and answer questions. He is alert to self. Disoriented to time, place, and situation. No agitation at this time. Schorr, NP notified. Will continue to monitor.

## 2015-03-18 NOTE — Progress Notes (Signed)
Text page to Dr Candiss Norse to make aware of certified RN's came about 9 am to replace supra pubic catheter and only 2 cc of yellow urine return see their progress note. Will repeat bladder scan & text age results to Dr Ronnie Derby

## 2015-03-18 NOTE — Progress Notes (Signed)
Patient Demographics:    [name] [name], is a 49 y.o. male, DOB - May 04, 1966, JIR:[id number]  Admit date - 03/15/2015   Admitting Physician Waldemar Dickens, MD  Outpatient Primary MD for the patient is [name], MD  LOS - 2   Chief Complaint  Patient presents with  . Emesis  . Weakness        Subjective:    [name] today has, No headache, No chest pain, No abdominal pain - No Nausea, No new weakness tingling or numbness, No Cough - SOB.     Assessment  & Plan :      1. Sepsis due to UTI causing nausea vomiting in a patient with Chr.Suprapubic catheter . ID on board, empiric Zosyn being continued, suprapubic catheter to be changed, and cultures negative thus far and urine cultures appear to be from a poor specimen. Since he has clinically responded well to Zosyn will continue.  Suprapubic catheter was changed on 03/17/2015 by nursing staff and there is some suggestion of leak, it will be exchanged on 03/18/2015.   2. Nausea vomiting. Likely due to #1 above, CT scan abdomen and pelvis shows no obstruction, and gums resolved now on regular diet.   3. 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 Kindly see wound care consult note for all details. For now supportive care, he follows with plastic surgeon Dr. [name] and will continue to do so post discharge. Air overlay bed provided.   4. Constipation. Resolved after bowel regimen, good stool output in the colostomy bag.   5. Chronic microcytic anemia + Dilution from IVF. No need for transfusion yet, on PPI, will check anemia panel may require IV iron or PRBC transfusion. Chronically on iron supplementation.   6. GERD. On PPI continue.   7. OSA. C Pap at night continue.   8.  Hypokalemia. replaced.    Code Status : Full code  Family Communication  : None present  Disposition Plan  : SNF in a few days  Consults  :  ID, wound care  Procedures  :  CT abdomen and pelvis unremarkable, PICC line placement  DVT Prophylaxis  :  Heparin   Lab Results  Component Value Date   PLT 449* 03/18/2015    Inpatient Medications  Scheduled Meds: . baclofen  10 mg Oral QID  . collagenase   Topical Daily  . feeding supplement (PRO-STAT SUGAR FREE 64)  30 mL Oral BID  . heparin  5,000 Units Subcutaneous 3 times per day  . lactose free nutrition  237 mL Oral BID BM  . lactose free nutrition  237 mL Oral TID BM  . magnesium sulfate 1 - 4 g bolus IVPB  1 g Intravenous Once  . pantoprazole  40 mg Oral BID AC  . piperacillin-tazobactam (ZOSYN)  IV  3.375 g Intravenous Q8H  . potassium chloride  10 mEq Intravenous Q1 Hr x 4  . sodium chloride  1,000 mL Intravenous Once  . sodium chloride  500 mL Intravenous Once  . sodium chloride  10-40 mL Intracatheter Q12H  . sodium chloride  3 mL Intravenous Q12H  . sucralfate  1 g Oral QID   Continuous Infusions: . sodium chloride  PRN Meds:.acetaminophen **OR** acetaminophen, docusate sodium, iohexol, ondansetron (ZOFRAN) IV, promethazine, sodium chloride  Antibiotics  :     Anti-infectives    Start     Dose/Rate Route Frequency Ordered Stop   03/16/15 1800  vancomycin (VANCOCIN) 1,750 mg in sodium chloride 0.9 % 500 mL IVPB  Status:  Discontinued     1,750 mg 250 mL/hr over 120 Minutes Intravenous  Once 03/16/15 1015 03/16/15 1037   03/16/15 1750  vancomycin (VANCOCIN) IVPB 1000 mg/200 mL premix  Status:  Discontinued     1,000 mg 200 mL/hr over 60 Minutes Intravenous  Once 03/16/15 1014 03/16/15 1015   03/16/15 1600  piperacillin-tazobactam (ZOSYN) IVPB 3.375 g     3.375 g 12.5 mL/hr over 240 Minutes Intravenous Every 8 hours 03/16/15 1411     03/16/15 1200  imipenem-cilastatin (PRIMAXIN) 500 mg in sodium chloride  0.9 % 100 mL IVPB  Status:  Discontinued     500 mg 200 mL/hr over 30 Minutes Intravenous Every 6 hours 03/16/15 1049 03/16/15 1405   03/16/15 1015  vancomycin (VANCOCIN) IVPB 1000 mg/200 mL premix  Status:  Discontinued     1,000 mg 200 mL/hr over 60 Minutes Intravenous  Once 03/16/15 1009 03/16/15 1014   03/16/15 0945  piperacillin-tazobactam (ZOSYN) IVPB 3.375 g     3.375 g 100 mL/hr over 30 Minutes Intravenous  Once 03/16/15 0939 03/16/15 1130        Objective:   Filed Vitals:   03/17/15 2004 03/17/15 2304 03/18/15 0427 03/18/15 0800  BP: 95/58 92/50 93/55  88/59  Pulse: 84 77 76   Temp: 97.9 F (36.6 C) 98.1 F (36.7 C) 97.1 F (36.2 C)   TempSrc: Oral Axillary Axillary   Resp: 15 12 17 22   Height:      Weight:      SpO2: 98% 100% 100%     Wt Readings from Last 3 Encounters:  03/16/15 88.6 kg (195 lb 5.2 oz)  01/21/15 92.987 kg (205 lb)  12/08/14 93 kg (205 lb 0.4 oz)     Intake/Output Summary (Last 24 hours) at 03/18/15 0843 Last data filed at 03/18/15 0200  Gross per 24 hour  Intake 1829.17 ml  Output    500 ml  Net 1329.17 ml     Physical Exam  Awake Alert, Oriented X 3, No new F.N deficits, multiple chronic contractures, Normal affect [place].AT,PERRAL Supple Neck,No JVD, No cervical lymphadenopathy appriciated.  Symmetrical Chest wall movement, Good air movement bilaterally, CTAB RRR,No Gallops,Rubs or new Murmurs, No Parasternal Heave +ve B.Sounds, Abd Soft, No tenderness, No organomegaly appriciated, No rebound - guarding or rigidity. Colostomy in place. No Cyanosis, Clubbing or edema, No new Rash or bruise  Multiple sacral, coccyx, bilateral feet decubitus ulcers kindly see wound care notes for all details The pubic catheter in place    Data Review:   Micro Results Recent Results (from the past 240 hour(s))  Urine culture     Status: None   Collection Time: 03/16/15  8:35 AM  Result Value Ref Range Status   Specimen Description URINE, RANDOM   Final   Special Requests ADDED [id number]  Final   Culture MULTIPLE SPECIES PRESENT, SUGGEST RECOLLECTION  Final   Report Status 03/17/2015 FINAL  Final  Culture, blood (routine x 2)     Status: None (Preliminary result)   Collection Time: 03/16/15  7:12 PM  Result Value Ref Range Status   Specimen Description BLOOD RIGHT ARM  Final   Special Requests  IN PEDIATRIC BOTTLE 1CC  Final   Culture NO GROWTH < 24 HOURS  Final   Report Status PENDING  Incomplete  Culture, blood (routine x 2)     Status: None (Preliminary result)   Collection Time: 03/16/15  7:23 PM  Result Value Ref Range Status   Specimen Description BLOOD RIGHT PICC LINE  Final   Special Requests BOTTLES DRAWN AEROBIC AND ANAEROBIC 10CC  Final   Culture NO GROWTH < 24 HOURS  Final   Report Status PENDING  Incomplete  MRSA PCR Screening     Status: Abnormal   Collection Time: 03/16/15  9:57 PM  Result Value Ref Range Status   MRSA by PCR INVALID RESULTS, SPECIMEN SENT FOR CULTURE (A) NEGATIVE Final    Comment: CALLED TO GARCIA,K RN 0040 03/17/15 MITCHELL,L        The GeneXpert MRSA Assay (FDA approved for NASAL specimens only), is one component of a comprehensive MRSA colonization surveillance program. It is not intended to diagnose MRSA infection nor to guide or monitor treatment for MRSA infections.   MRSA culture     Status: None (Preliminary result)   Collection Time: 03/16/15  9:57 PM  Result Value Ref Range Status   Specimen Description NASAL SWAB  Final   Special Requests NONE  Final   Culture   Final    NO SUSPICIOUS COLONIES, CONTINUING TO HOLD Performed at [facility]    Report Status PENDING  Incomplete    Radiology Reports Ct Abdomen Pelvis Wo Contrast  03/16/2015  CLINICAL DATA:  Nausea and vomiting. Abdominal pain. Quadriplegia, stage IV decubitus ulcers. EXAM: CT ABDOMEN AND PELVIS WITHOUT CONTRAST TECHNIQUE: Multidetector CT imaging of the abdomen and pelvis was performed following  the standard protocol without IV contrast. COMPARISON:  CT abdomen and pelvis December 08, 2014 FINDINGS: LUNG BASES: Similar LEFT lung base pleural thickening, old LEFT rib fractures. Minimal RIGHT pleural thickening is unchanged. Included heart size is normal, no pericardial effusions. KIDNEYS/BLADDER: Kidneys are orthotopic, demonstrating normal size and morphology. No nephrolithiasis, hydronephrosis; limited assessment for renal masses on this nonenhanced examination. The unopacified ureters are normal in course and caliber. Urinary bladder is decompressed, via suprapubic catheter. SOLID ORGANS: The liver, spleen, gallbladder, pancreas are unremarkable for this non-contrast examination. Nodular thickening of the bilateral funeral glands, suggesting hyperplasia. GASTROINTESTINAL TRACT: Small to moderate hiatal hernia. The stomach, small and large bowel are normal in course and caliber without inflammatory changes, the sensitivity may be decreased by lack of enteric contrast. LEFT lower quadrant colostomy. Moderate amount of retained large bowel stool in pouch. PERITONEUM/RETROPERITONEUM: Aortoiliac vessels are normal in course and caliber, mild calcific atherosclerosis. No lymphadenopathy by CT size criteria. Internal reproductive organs are unremarkable. No intraperitoneal free fluid nor free air. SOFT TISSUES/ OSSEOUS STRUCTURES: Destructive changes of bilateral hips, and pelvic bones with heterotopic ossification, RIGHT hip intra-articular gas and multiple ulcers/sinus tracts. IMPRESSION: No urolithiasis, obstructive uropathy nor acute intra-abdominal/pelvic process. 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 LEFT lower quadrant colostomy. However, moderate amount of retained large bowel stool within pouch. This is likely residual fecal material though, if there is a clinical concern for fistula, rectal contrast may be indicated.  Electronically Signed   By: Elon Alas M.D.   On: 03/16/2015 02:44   Abd 1 View (kub)  03/17/2015  CLINICAL DATA:  Constipation. EXAM: ABDOMEN - 1 VIEW COMPARISON:  CT 03/16/2015.  KUB 03/15/2015. FINDINGS: Prominent amount of stool  remains in the left colon and rectosigmoid. Mild small large bowel distention. Adynamic ileus cannot be excluded. Moderate gastric distention. Degenerative changes lumbar spine. Deformity of the hips again noted. IMPRESSION: 1. Prominent stool remains in the left colon and rectosigmoid suggesting constipation. 2. Slightly prominent loops of small and large bowel. Adynamic ileus cannot be excluded. 3. Mild gastric distention. Electronically Signed   By: Marcello Moores  Register   On: 03/17/2015 07:38   Ir Fluoro Guide Cv Line Left  03/16/2015  CLINICAL DATA:  49 year old with recurrent urinary tract infections and poor venous access. EXAM: PLACEMENT OF PERIPHERALLY INSERTED CENTRAL VENOUS CATHETER (PICC) WITH ULTRASOUND AND FLUOROSCOPIC GUIDANCE FLUOROSCOPY TIME:  3.0 minutes, 34.5 mGy TECHNIQUE: The procedure was explained to the patient. The risks and benefits of the procedure were discussed and the patient's questions were addressed. Informed consent was obtained from the patient. The left arm was prepped with chlorhexidine, draped in the usual sterile fashion using maximum barrier technique (cap and mask, sterile gown, sterile gloves, large sterile sheet, hand hygiene and cutaneous antiseptic). Local anesthesia was attained by infiltration with 1% lidocaine. Ultrasound demonstrated patency of the left brachial vein, and this was documented with an image. Under real-time ultrasound guidance, this vein was accessed with a 21 gauge micropuncture needle and image documentation was performed. The needle was exchanged over a guidewire for a peel-away sheath. A 49 cm dual lumen Power PICC line was advanced through the sheath but would not easily advance beyond the axilla. Therefore,  contrast was injected to visualize the venous anatomy. Eventually, catheter was advanced central to the left axillary vein by using a micro wire through each lumen. Catheter tip was eventually placed at the superior cavoatrial junction. Catheter was sutured to the skin. Findings: There is a patent left brachial vein. There appears to be stenosis at the left axillary line likely related to prior PICC lines. Catheter tip at the superior cavoatrial junction. Estimated blood loss: Minimal CONTRAST:  5 mL Omnipaque 102 COMPLICATIONS: None.  The patient tolerated the procedure well. IMPRESSION: Successful placement of a left PICC line with ultrasound and fluoroscopic guidance. PICC line placement was successful but the placement was difficult due to narrowing in the left axillary vein and likely related to prior line placements. Placement of a left arm PICC line in the future may be difficult. Electronically Signed   By: Markus Daft M.D.   On: 03/16/2015 17:34   Ir US Guide Vasc Access Left  03/16/2015  CLINICAL DATA:  49 year old with recurrent urinary tract infections and poor venous access. EXAM: PLACEMENT OF PERIPHERALLY INSERTED CENTRAL VENOUS CATHETER (PICC) WITH ULTRASOUND AND FLUOROSCOPIC GUIDANCE FLUOROSCOPY TIME:  3.0 minutes, 34.5 mGy TECHNIQUE: The procedure was explained to the patient. The risks and benefits of the procedure were discussed and the patient's questions were addressed. Informed consent was obtained from the patient. The left arm was prepped with chlorhexidine, draped in the usual sterile fashion using maximum barrier technique (cap and mask, sterile gown, sterile gloves, large sterile sheet, hand hygiene and cutaneous antiseptic). Local anesthesia was attained by infiltration with 1% lidocaine. Ultrasound demonstrated patency of the left brachial vein, and this was documented with an image. Under real-time ultrasound guidance, this vein was accessed with a 21 gauge micropuncture needle and  image documentation was performed. The needle was exchanged over a guidewire for a peel-away sheath. A 49 cm dual lumen Power PICC line was advanced through the sheath but would not easily advance beyond the axilla. Therefore, contrast  was injected to visualize the venous anatomy. Eventually, catheter was advanced central to the left axillary vein by using a micro wire through each lumen. Catheter tip was eventually placed at the superior cavoatrial junction. Catheter was sutured to the skin. Findings: There is a patent left brachial vein. There appears to be stenosis at the left axillary line likely related to prior PICC lines. Catheter tip at the superior cavoatrial junction. Estimated blood loss: Minimal CONTRAST:  5 mL Omnipaque 993 COMPLICATIONS: None.  The patient tolerated the procedure well. IMPRESSION: Successful placement of a left PICC line with ultrasound and fluoroscopic guidance. PICC line placement was successful but the placement was difficult due to narrowing in the left axillary vein and likely related to prior line placements. Placement of a left arm PICC line in the future may be difficult. Electronically Signed   By: Markus Daft M.D.   On: 03/16/2015 17:34   Dg Chest Port 1 View  03/16/2015  CLINICAL DATA:  Sepsis.  Nausea and vomiting. EXAM: PORTABLE CHEST 1 VIEW COMPARISON:  03/15/2015. Multiple previous chest radiographs. Chest CT 10/26/2014 FINDINGS: Artifact overlies chest. Heart and mediastinal shadows are normal. There slight worsening of volume loss in the left lower lobe compared to the previous study. Lytic change of the right posterior fifth rib with areas of adjacent calcification probably relate to pressure erosion of the rib because in adjacent exostosis from the scapula. There could be local symptoms in this region related to that. IMPRESSION: Slight worsening of volume loss in the left lower lobe since yesterday. Chronic lucent bone changes of the right posterior fifth rib and  soft tissues presumably related to exostosis of the scapula with chronic chest wall changes. Electronically Signed   By: Nelson Chimes M.D.   On: 03/16/2015 10:36   Dg Abd Acute W/chest  03/15/2015  CLINICAL DATA:  Nausea for one day; quadriplegia EXAM: DG ABDOMEN ACUTE W/ 1V CHEST COMPARISON:  None. FINDINGS: Nonobstructive gas pattern. Moderate fecal retention. Mild stool distension of the rectum. Severe chronic bilateral hip dysplasia.No free air. IMPRESSION: Constipation.  No acute findings. Electronically Signed   By: Skipper Cliche M.D.   On: 03/15/2015 17:03     CBC  Recent Labs Lab 03/15/15 2259 03/16/15 1912 03/17/15 0835 03/18/15 0636  WBC  --  14.9* 15.9* 9.3  HGB 9.2* 6.4* 7.8* 7.1*  HCT 27.0* 22.1* 25.6* 23.4*  PLT  --  589* 495* 449*  MCV  --  77.8* 80.0 80.1  MCH  --  22.5* 24.4* 24.3*  MCHC  --  29.0* 30.5 30.3  RDW  --  16.3* 16.0* 16.6*  LYMPHSABS  --  0.7  --   --   MONOABS  --  1.1*  --   --   EOSABS  --  0.5  --   --   BASOSABS  --  0.0  --   --     Chemistries   Recent Labs Lab 03/15/15 2255 03/15/15 2258 03/15/15 2259 03/17/15 0835 03/18/15 0636  NA 137  --  141 144 137  K 3.7  --  4.0 3.4* 3.3*  CL 100*  --  103 109 106  CO2 26  --   --  26 26  GLUCOSE 118*  --  118* 119* 100*  BUN 22*  --  27* 20 12  CREATININE 0.83 0.90 0.80 0.62 0.57*  CALCIUM 8.2*  --   --  8.4* 8.5*  MG  --   --   --   --  1.8  AST 14*  --   --   --   --   ALT 11*  --   --   --   --   ALKPHOS 74  --   --   --   --   BILITOT 0.5  --   --   --   --    ------------------------------------------------------------------------------------------------------------------ estimated creatinine clearance is 120.3 mL/min (by C-G formula based on Cr of 0.57). ------------------------------------------------------------------------------------------------------------------ No results for input(s): HGBA1C in the last 72  hours. ------------------------------------------------------------------------------------------------------------------ No results for input(s): CHOL, HDL, LDLCALC, TRIG, CHOLHDL, LDLDIRECT in the last 72 hours. ------------------------------------------------------------------------------------------------------------------ No results for input(s): TSH, T4TOTAL, T3FREE, THYROIDAB in the last 72 hours.  Invalid input(s): FREET3 ------------------------------------------------------------------------------------------------------------------ No results for input(s): VITAMINB12, FOLATE, FERRITIN, TIBC, IRON, RETICCTPCT in the last 72 hours.  Coagulation profile  Recent Labs Lab 03/16/15 1912  INR 1.35    No results for input(s): DDIMER in the last 72 hours.  Cardiac Enzymes No results for input(s): CKMB, TROPONINI, MYOGLOBIN in the last 168 hours.  Invalid input(s): CK ------------------------------------------------------------------------------------------------------------------ Invalid input(s): POCBNP   Time Spent in minutes   35   Laurena Valko K M.D on 03/18/2015 at 8:43 AM  Between 7am to 7pm - Pager - [phone]  After 7pm go to [email or web address] - password [email or web address]  [facility] -  Office  [phone]

## 2015-03-19 ENCOUNTER — Telehealth: Payer: Self-pay | Admitting: Hematology

## 2015-03-19 DIAGNOSIS — F502 Bulimia nervosa: Secondary | ICD-10-CM

## 2015-03-19 DIAGNOSIS — R1114 Bilious vomiting: Secondary | ICD-10-CM

## 2015-03-19 DIAGNOSIS — D520 Dietary folate deficiency anemia: Secondary | ICD-10-CM

## 2015-03-19 LAB — BASIC METABOLIC PANEL
ANION GAP: 6 (ref 5–15)
BUN: 6 mg/dL (ref 6–20)
CALCIUM: 7.8 mg/dL — AB (ref 8.9–10.3)
CO2: 23 mmol/L (ref 22–32)
Chloride: 110 mmol/L (ref 101–111)
Creatinine, Ser: 0.41 mg/dL — ABNORMAL LOW (ref 0.61–1.24)
GFR calc Af Amer: >60 mL/min (ref >60)
GLUCOSE: 92 mg/dL (ref 65–99)
Potassium: 3.3 mmol/L — ABNORMAL LOW (ref 3.5–5.1)
Sodium: 139 mmol/L (ref 135–145)

## 2015-03-19 LAB — HEMOGLOBIN AND HEMATOCRIT, BLOOD
HCT: 22 % — ABNORMAL LOW (ref 39.0–52.0)
Hemoglobin: 6.7 g/dL — CL (ref 13.0–17.0)

## 2015-03-19 LAB — PREPARE RBC (CROSSMATCH)

## 2015-03-19 IMAGING — RF DG FLUORO GUIDE NDL PLC/BX
1 series · 1 of 1 positions shown · non-contrast
Comparison: none

CLINICAL DATA: Enlarging right hip effusion

RIGHT HIP ASPIRATION UNDER FLUOROSCOPY
TECHNIQUE: The procedure, risks (including but not limited to
bleeding, infection, organ damage), benefits, and alternatives were
explained to the patient.  Questions regarding the procedure were
encouraged and answered.  The patient understands and consents to
the procedure. An appropriate skin entry site was determined under
fluoroscopy.  Site was marked, prepped with Betadine, draped in
usual sterile fashion, infiltrated locally with 1% lidocaine. A 18
gauge spinal needle was advanced to the right hip joint. 4 ml
bloody fluid were aspirated, sent for the requested laboratory
studies.  The patient tolerated procedure well, with no immediate
complication.
Fluoro time: 16 seconds

[Series 1: run · 1 of 1 slices shown]
[im 1/1]
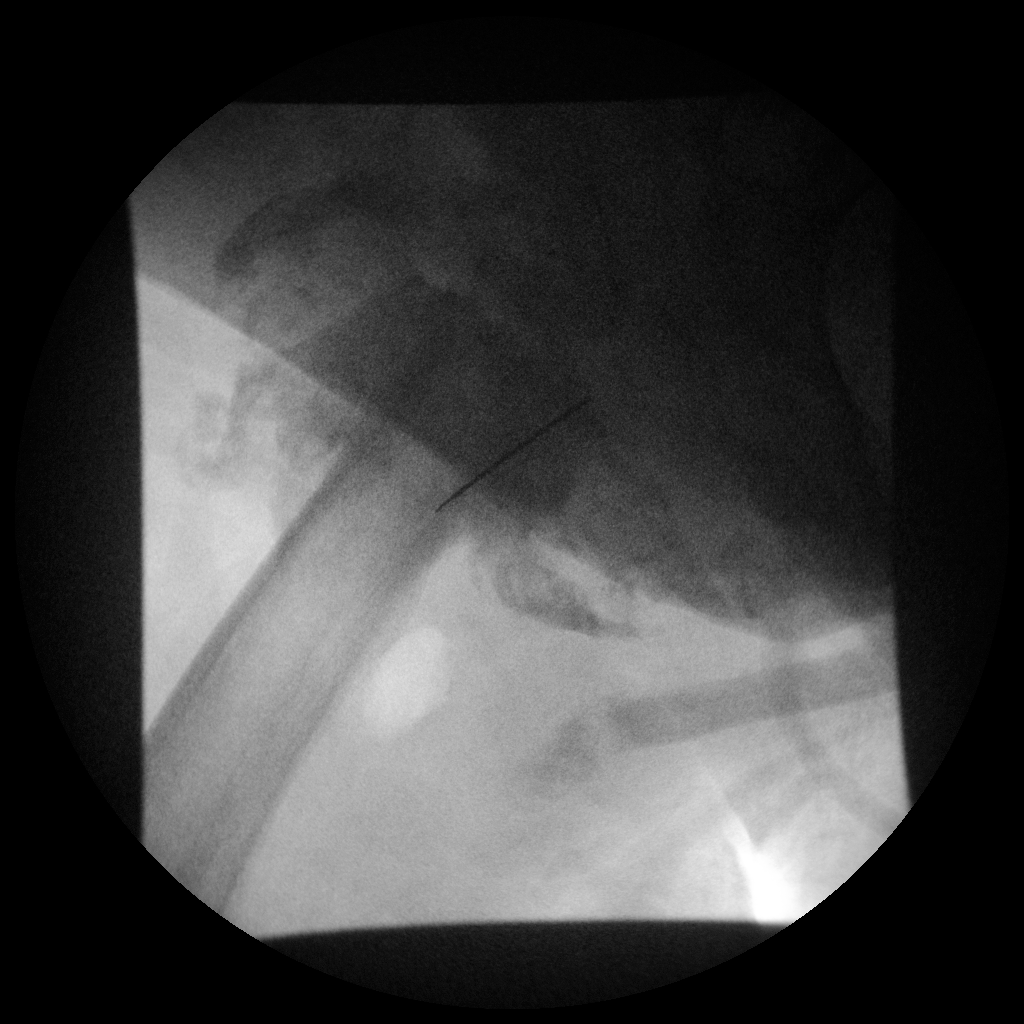

[1 of 1 positions shown; findings below may reference images not displayed]

IMPRESSION: 1.  Technically successful right hip aspiration  under fluoroscopy

## 2015-03-19 MED ORDER — DOCUSATE SODIUM 100 MG PO CAPS
200.0000 mg | ORAL_CAPSULE | Freq: Two times a day (BID) | ORAL | Status: DC
  Administered 2015-03-19 – 2015-03-20 (×2): 200 mg via ORAL
  Filled 2015-03-19 (×2): qty 2

## 2015-03-19 MED ORDER — SODIUM CHLORIDE 0.9 % IV SOLN
Freq: Once | INTRAVENOUS | Status: AC
  Administered 2015-03-19: 250 mL via INTRAVENOUS

## 2015-03-19 MED ORDER — DIPHENHYDRAMINE HCL 50 MG/ML IJ SOLN: 25.0000 mg | Freq: Four times a day (QID) | INTRAMUSCULAR | Status: DC | PRN

## 2015-03-19 NOTE — Care Management Note (Signed)
Case Management Note  Patient Details  Name: [name] [name] MRN: [id number] Date of Birth: 1965/11/07  Subjective/Objective:    Pt lives with caregiver, is active with [facility] for weekly nursing visits.  TC to [facility] liaison re plan to discharge home on IV antibx.                            Expected Discharge Plan:  [place]  Discharge planning Services  CM Consult  Post Acute Care Choice:  Resumption of Svcs/PTA Provider  HH Arranged:  RN [facility] Agency:  [facility]  Status of Service:  In process, will continue to follow  Girard Cooter, RN 03/19/2015, 2:25 PM

## 2015-03-19 NOTE — Progress Notes (Signed)
Late entry Patient transferred from [facility]. CPAP, phone, computer at bedside. Multiple pressure ulcers on air overlay mattress.

## 2015-03-19 NOTE — Progress Notes (Signed)
ANTIBIOTIC CONSULT NOTE  Pharmacy Consult for Zosyn Indication: Sepsis    Allergies  Allergen Reactions  . Ditropan [Oxybutynin] Other (See Comments)    hallucinations    Patient Measurements: Height: 5\' 11"  (180.3 cm) Weight: 195 lb 5.2 oz (88.6 kg) IBW/kg (Calculated) : 75.3  Vital Signs: Temp: 98.3 F (36.8 C) (11/03 1015) Temp Source: Oral (11/03 1015) BP: 87/54 mmHg (11/03 1015) Pulse Rate: 84 (11/03 0900) Intake/Output from previous day: 11/02 0701 - 11/03 0700 In: 3787.8 [P.O.:1437; I.V.:1200.8; IV Piggyback:1150] Out: 3614 [Urine:1125; Stool:300] Intake/Output from this shift: Total I/O In: 510 [P.O.:480; Blood:30] Out: 150 [Urine:150]  Labs:  Recent Labs  03/16/15 1912 03/17/15 0835 03/18/15 0636 03/19/15 0634  WBC 14.9* 15.9* 9.3  --   HGB 6.4* 7.8* 7.1* 6.7*  PLT 589* 495* 449*  --   CREATININE  --  0.62 0.57* 0.41*   Estimated Creatinine Clearance: 120.3 mL/min (by C-G formula based on Cr of 0.41). No results for input(s): VANCOTROUGH, VANCOPEAK, VANCORANDOM, GENTTROUGH, GENTPEAK, GENTRANDOM, TOBRATROUGH, TOBRAPEAK, TOBRARND, AMIKACINPEAK, AMIKACINTROU, AMIKACIN in the last 72 hours.   Microbiology: Recent Results (from the past 720 hour(s))  Urine culture     Status: None   Collection Time: 03/16/15  8:35 AM  Result Value Ref Range Status   Specimen Description URINE, RANDOM  Final   Special Requests ADDED [id number]  Final   Culture MULTIPLE SPECIES PRESENT, SUGGEST RECOLLECTION  Final   Report Status 03/17/2015 FINAL  Final  Culture, blood (routine x 2)     Status: None (Preliminary result)   Collection Time: 03/16/15  7:12 PM  Result Value Ref Range Status   Specimen Description BLOOD RIGHT ARM  Final   Special Requests IN PEDIATRIC BOTTLE 1CC  Final   Culture NO GROWTH 2 DAYS  Final   Report Status PENDING  Incomplete  Culture, blood (routine x 2)     Status: None (Preliminary result)   Collection Time: 03/16/15  7:23 PM  Result Value  Ref Range Status   Specimen Description BLOOD RIGHT PICC LINE  Final   Special Requests BOTTLES DRAWN AEROBIC AND ANAEROBIC 10CC  Final   Culture NO GROWTH 2 DAYS  Final   Report Status PENDING  Incomplete  MRSA PCR Screening     Status: Abnormal   Collection Time: 03/16/15  9:57 PM  Result Value Ref Range Status   MRSA by PCR INVALID RESULTS, SPECIMEN SENT FOR CULTURE (A) NEGATIVE Final    Comment: CALLED TO GARCIA,K RN 0040 03/17/15 MITCHELL,L        The GeneXpert MRSA Assay (FDA approved for NASAL specimens only), is one component of a comprehensive MRSA colonization surveillance program. It is not intended to diagnose MRSA infection nor to guide or monitor treatment for MRSA infections.   MRSA culture     Status: None   Collection Time: 03/16/15  9:57 PM  Result Value Ref Range Status   Specimen Description NASAL SWAB  Final   Special Requests NONE  Final   Culture NOMRSA Performed at [facility]   Final   Report Status 03/18/2015 FINAL  Final   Assessment: 34 YOM with quadriplegia who presented to the ED with vomiting and weakness on 10/30. Pharmacy consulted to start IV Zosyn for sepsis with likely UTI. Urology has seen and suprapubic catheter has been changed. Based on previous cultures, Zosyn is best option (has history of MDROs). No new growth on cultures from this admission.  Improvement in sCr 0.8>0.4, CrCl >  100 mL/min (though difficult to tell true renal function given quadriplegia).   WBC now normal, afebrile.  Goal of Therapy:  Eradication of infection  Plan:  -Zosyn 3.375 gm IV q8h EI- to stop after doses on 11/8 as per ID recommendations (stop date entered) -Monitor renal function and clinical progression  Drey Shaff D. Verley Pariseau, PharmD, BCPS Clinical Pharmacist Pager: [phone] 03/19/2015 11:50 AM

## 2015-03-19 NOTE — Progress Notes (Signed)
Called report to [address] [place] spoke to [employer]   Pt will transfer per bed.

## 2015-03-19 NOTE — Progress Notes (Addendum)
Attempted to receive report awaiting call back.  Report received from [place] on 2C.

## 2015-03-19 NOTE — Progress Notes (Addendum)
Pt transferred per bed to [address] to room 29 with all belongings including glasses, laptop & charger, cell phone and charger.

## 2015-03-19 NOTE — Telephone Encounter (Signed)
pt called to cx appt due to in hospital....pt ok and aware to call to r/s

## 2015-03-19 NOTE — Progress Notes (Signed)
Patient Demographics:    [name] [name], is a 49 y.o. male, DOB - April 28, 1966, VQX:[id number]  Admit date - 03/15/2015   Admitting Physician Waldemar Dickens, MD  Outpatient Primary MD for the patient is [name], MD  LOS - 3   Chief Complaint  Patient presents with  . Emesis  . Weakness        Subjective:    [name] today has, No headache, No chest pain, No abdominal pain - No Nausea, No new weakness tingling or numbness, No Cough - SOB.     Assessment  & Plan :      1. Sepsis due to UTI causing nausea vomiting in a patient with Chr.Suprapubic catheter . ID on board, empiric Zosyn being continued, suprapubic catheter to be changed, and cultures negative thus far and urine cultures appear to be from a poor specimen. Since he has clinically responded well to Zosyn will continue. Already has a PICC line. Plan is to discharge tomorrow with one more week of IV Zosyn at home. Case management consulted and home RN requested.  Suprapubic catheter was changed on 03/17/2015 by nursing staff and there is some suggestion of leak, as was corrected by urology on 03/18/2015.    2. Nausea vomiting. Likely due to #1 above, CT scan abdomen and pelvis shows no obstruction, and gums resolved now on regular diet.   3. 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 Kindly see wound care consult note for all details. For now supportive care, he follows with plastic surgeon Dr. [name] and will continue to do so post discharge. Air overlay bed provided.   4. Constipation. Resolved after bowel regimen, good stool output in the colostomy bag.   5. Chronic microcytic anemia + Dilution from IVF. No need for transfusion yet, on PPI, inconclusive anemia panel, will  receive 1 unit of packed RBC on 03/19/2015. Chronically on iron supplementation which will be continued.   6. GERD. On PPI continue.   7. OSA. C Pap at night continue.   8. Hypokalemia. replaced.    Code Status : Full code  Family Communication  : None present  Disposition Plan  : Discharge home with home health RN with PICC line and one week of IV antibiotics on 03/20/2015.  Consults  :  ID, wound care, urology  Procedures  :  CT abdomen and pelvis unremarkable, PICC line placement  DVT Prophylaxis  :  Heparin   Lab Results  Component Value Date   PLT 449* 03/18/2015    Inpatient Medications  Scheduled Meds: . sodium chloride   Intravenous Once  . baclofen  10 mg Oral QID  . collagenase   Topical Daily  . darifenacin  15 mg Oral Daily  . feeding supplement (PRO-STAT SUGAR FREE 64)  30 mL Oral BID  . heparin  5,000 Units Subcutaneous 3 times per day  . lactose free nutrition  237 mL Oral BID BM  . lactose free nutrition  237 mL Oral TID BM  . pantoprazole  40 mg Oral BID AC  . piperacillin-tazobactam (ZOSYN)  IV  3.375 g Intravenous Q8H  . sodium chloride  1,000 mL Intravenous Once  . sodium chloride  10-40 mL  Intracatheter Q12H  . sodium chloride  3 mL Intravenous Q12H  . sucralfate  1 g Oral QID   Continuous Infusions:   PRN Meds:.acetaminophen **OR** acetaminophen, diphenhydrAMINE, docusate sodium, iohexol, ondansetron (ZOFRAN) IV, promethazine, sodium chloride  Antibiotics  :     Anti-infectives    Start     Dose/Rate Route Frequency Ordered Stop   03/16/15 1800  vancomycin (VANCOCIN) 1,750 mg in sodium chloride 0.9 % 500 mL IVPB  Status:  Discontinued     1,750 mg 250 mL/hr over 120 Minutes Intravenous  Once 03/16/15 1015 03/16/15 1037   03/16/15 1750  vancomycin (VANCOCIN) IVPB 1000 mg/200 mL premix  Status:  Discontinued     1,000 mg 200 mL/hr over 60 Minutes Intravenous  Once 03/16/15 1014 03/16/15 1015   03/16/15 1600  piperacillin-tazobactam  (ZOSYN) IVPB 3.375 g     3.375 g 12.5 mL/hr over 240 Minutes Intravenous Every 8 hours 03/16/15 1411     03/16/15 1200  imipenem-cilastatin (PRIMAXIN) 500 mg in sodium chloride 0.9 % 100 mL IVPB  Status:  Discontinued     500 mg 200 mL/hr over 30 Minutes Intravenous Every 6 hours 03/16/15 1049 03/16/15 1405   03/16/15 1015  vancomycin (VANCOCIN) IVPB 1000 mg/200 mL premix  Status:  Discontinued     1,000 mg 200 mL/hr over 60 Minutes Intravenous  Once 03/16/15 1009 03/16/15 1014   03/16/15 0945  piperacillin-tazobactam (ZOSYN) IVPB 3.375 g     3.375 g 100 mL/hr over 30 Minutes Intravenous  Once 03/16/15 0939 03/16/15 1130        Objective:   Filed Vitals:   03/18/15 2000 03/19/15 0000 03/19/15 0038 03/19/15 0410  BP: 105/83 102/64  91/58  Pulse:   87 86  Temp: 98.4 F (36.9 C) 98.9 F (37.2 C)  98.6 F (37 C)  TempSrc: Oral Axillary  Oral  Resp: 19 23 20 16   Height:      Weight:      SpO2: 100% 100% 100% 100%    Wt Readings from Last 3 Encounters:  03/16/15 88.6 kg (195 lb 5.2 oz)  01/21/15 92.987 kg (205 lb)  12/08/14 93 kg (205 lb 0.4 oz)     Intake/Output Summary (Last 24 hours) at 03/19/15 0925 Last data filed at 03/19/15 0840  Gross per 24 hour  Intake 3387.83 ml  Output   1575 ml  Net 1812.83 ml     Physical Exam  Awake Alert, Oriented X 3, No new F.N deficits, multiple chronic contractures, Normal affect [place].AT,PERRAL Supple Neck,No JVD, No cervical lymphadenopathy appriciated.  Symmetrical Chest wall movement, Good air movement bilaterally, CTAB RRR,No Gallops,Rubs or new Murmurs, No Parasternal Heave +ve B.Sounds, Abd Soft, No tenderness, No organomegaly appriciated, No rebound - guarding or rigidity. Colostomy in place. No Cyanosis, Clubbing or edema, No new Rash or bruise  Multiple sacral, coccyx, bilateral feet decubitus ulcers kindly see wound care notes for all details The pubic catheter in place    Data Review:   Micro Results Recent  Results (from the past 240 hour(s))  Urine culture     Status: None   Collection Time: 03/16/15  8:35 AM  Result Value Ref Range Status   Specimen Description URINE, RANDOM  Final   Special Requests ADDED [id number] 1120  Final   Culture MULTIPLE SPECIES PRESENT, SUGGEST RECOLLECTION  Final   Report Status 03/17/2015 FINAL  Final  Culture, blood (routine x 2)     Status: None (Preliminary result)  Collection Time: 03/16/15  7:12 PM  Result Value Ref Range Status   Specimen Description BLOOD RIGHT ARM  Final   Special Requests IN PEDIATRIC BOTTLE 1CC  Final   Culture NO GROWTH 2 DAYS  Final   Report Status PENDING  Incomplete  Culture, blood (routine x 2)     Status: None (Preliminary result)   Collection Time: 03/16/15  7:23 PM  Result Value Ref Range Status   Specimen Description BLOOD RIGHT PICC LINE  Final   Special Requests BOTTLES DRAWN AEROBIC AND ANAEROBIC 10CC  Final   Culture NO GROWTH 2 DAYS  Final   Report Status PENDING  Incomplete  MRSA PCR Screening     Status: Abnormal   Collection Time: 03/16/15  9:57 PM  Result Value Ref Range Status   MRSA by PCR INVALID RESULTS, SPECIMEN SENT FOR CULTURE (A) NEGATIVE Final    Comment: CALLED TO GARCIA,K RN 0040 03/17/15 MITCHELL,L        The GeneXpert MRSA Assay (FDA approved for NASAL specimens only), is one component of a comprehensive MRSA colonization surveillance program. It is not intended to diagnose MRSA infection nor to guide or monitor treatment for MRSA infections.   MRSA culture     Status: None   Collection Time: 03/16/15  9:57 PM  Result Value Ref Range Status   Specimen Description NASAL SWAB  Final   Special Requests NONE  Final   Culture NOMRSA Performed at [facility]   Final   Report Status 03/18/2015 FINAL  Final    Radiology Reports Ct Abdomen Pelvis Wo Contrast  03/16/2015  CLINICAL DATA:  Nausea and vomiting. Abdominal pain. Quadriplegia, stage IV decubitus ulcers. EXAM: CT ABDOMEN AND  PELVIS WITHOUT CONTRAST TECHNIQUE: Multidetector CT imaging of the abdomen and pelvis was performed following the standard protocol without IV contrast. COMPARISON:  CT abdomen and pelvis December 08, 2014 FINDINGS: LUNG BASES: Similar LEFT lung base pleural thickening, old LEFT rib fractures. Minimal RIGHT pleural thickening is unchanged. Included heart size is normal, no pericardial effusions. KIDNEYS/BLADDER: Kidneys are orthotopic, demonstrating normal size and morphology. No nephrolithiasis, hydronephrosis; limited assessment for renal masses on this nonenhanced examination. The unopacified ureters are normal in course and caliber. Urinary bladder is decompressed, via suprapubic catheter. SOLID ORGANS: The liver, spleen, gallbladder, pancreas are unremarkable for this non-contrast examination. Nodular thickening of the bilateral funeral glands, suggesting hyperplasia. GASTROINTESTINAL TRACT: Small to moderate hiatal hernia. The stomach, small and large bowel are normal in course and caliber without inflammatory changes, the sensitivity may be decreased by lack of enteric contrast. LEFT lower quadrant colostomy. Moderate amount of retained large bowel stool in pouch. PERITONEUM/RETROPERITONEUM: Aortoiliac vessels are normal in course and caliber, mild calcific atherosclerosis. No lymphadenopathy by CT size criteria. Internal reproductive organs are unremarkable. No intraperitoneal free fluid nor free air. SOFT TISSUES/ OSSEOUS STRUCTURES: Destructive changes of bilateral hips, and pelvic bones with heterotopic ossification, RIGHT hip intra-articular gas and multiple ulcers/sinus tracts. IMPRESSION: No urolithiasis, obstructive uropathy nor acute intra-abdominal/pelvic process. 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 LEFT lower quadrant colostomy. However, moderate amount of retained large bowel stool within pouch. This is likely residual  fecal material though, if there is a clinical concern for fistula, rectal contrast may be indicated. Electronically Signed   By: Elon Alas M.D.   On: 03/16/2015 02:44   Abd 1 View (kub)  03/17/2015  CLINICAL DATA:  Constipation. EXAM: ABDOMEN - 1  VIEW COMPARISON:  CT 03/16/2015.  KUB 03/15/2015. FINDINGS: Prominent amount of stool remains in the left colon and rectosigmoid. Mild small large bowel distention. Adynamic ileus cannot be excluded. Moderate gastric distention. Degenerative changes lumbar spine. Deformity of the hips again noted. IMPRESSION: 1. Prominent stool remains in the left colon and rectosigmoid suggesting constipation. 2. Slightly prominent loops of small and large bowel. Adynamic ileus cannot be excluded. 3. Mild gastric distention. Electronically Signed   By: Marcello Moores  Register   On: 03/17/2015 07:38   Ir Fluoro Guide Cv Line Left  03/16/2015  CLINICAL DATA:  49 year old with recurrent urinary tract infections and poor venous access. EXAM: PLACEMENT OF PERIPHERALLY INSERTED CENTRAL VENOUS CATHETER (PICC) WITH ULTRASOUND AND FLUOROSCOPIC GUIDANCE FLUOROSCOPY TIME:  3.0 minutes, 34.5 mGy TECHNIQUE: The procedure was explained to the patient. The risks and benefits of the procedure were discussed and the patient's questions were addressed. Informed consent was obtained from the patient. The left arm was prepped with chlorhexidine, draped in the usual sterile fashion using maximum barrier technique (cap and mask, sterile gown, sterile gloves, large sterile sheet, hand hygiene and cutaneous antiseptic). Local anesthesia was attained by infiltration with 1% lidocaine. Ultrasound demonstrated patency of the left brachial vein, and this was documented with an image. Under real-time ultrasound guidance, this vein was accessed with a 21 gauge micropuncture needle and image documentation was performed. The needle was exchanged over a guidewire for a peel-away sheath. A 49 cm dual lumen Power  PICC line was advanced through the sheath but would not easily advance beyond the axilla. Therefore, contrast was injected to visualize the venous anatomy. Eventually, catheter was advanced central to the left axillary vein by using a micro wire through each lumen. Catheter tip was eventually placed at the superior cavoatrial junction. Catheter was sutured to the skin. Findings: There is a patent left brachial vein. There appears to be stenosis at the left axillary line likely related to prior PICC lines. Catheter tip at the superior cavoatrial junction. Estimated blood loss: Minimal CONTRAST:  5 mL Omnipaque 235 COMPLICATIONS: None.  The patient tolerated the procedure well. IMPRESSION: Successful placement of a left PICC line with ultrasound and fluoroscopic guidance. PICC line placement was successful but the placement was difficult due to narrowing in the left axillary vein and likely related to prior line placements. Placement of a left arm PICC line in the future may be difficult. Electronically Signed   By: Markus Daft M.D.   On: 03/16/2015 17:34   Ir US Guide Vasc Access Left  03/16/2015  CLINICAL DATA:  49 year old with recurrent urinary tract infections and poor venous access. EXAM: PLACEMENT OF PERIPHERALLY INSERTED CENTRAL VENOUS CATHETER (PICC) WITH ULTRASOUND AND FLUOROSCOPIC GUIDANCE FLUOROSCOPY TIME:  3.0 minutes, 34.5 mGy TECHNIQUE: The procedure was explained to the patient. The risks and benefits of the procedure were discussed and the patient's questions were addressed. Informed consent was obtained from the patient. The left arm was prepped with chlorhexidine, draped in the usual sterile fashion using maximum barrier technique (cap and mask, sterile gown, sterile gloves, large sterile sheet, hand hygiene and cutaneous antiseptic). Local anesthesia was attained by infiltration with 1% lidocaine. Ultrasound demonstrated patency of the left brachial vein, and this was documented with an image.  Under real-time ultrasound guidance, this vein was accessed with a 21 gauge micropuncture needle and image documentation was performed. The needle was exchanged over a guidewire for a peel-away sheath. A 49 cm dual lumen Power PICC line was advanced  through the sheath but would not easily advance beyond the axilla. Therefore, contrast was injected to visualize the venous anatomy. Eventually, catheter was advanced central to the left axillary vein by using a micro wire through each lumen. Catheter tip was eventually placed at the superior cavoatrial junction. Catheter was sutured to the skin. Findings: There is a patent left brachial vein. There appears to be stenosis at the left axillary line likely related to prior PICC lines. Catheter tip at the superior cavoatrial junction. Estimated blood loss: Minimal CONTRAST:  5 mL Omnipaque 128 COMPLICATIONS: None.  The patient tolerated the procedure well. IMPRESSION: Successful placement of a left PICC line with ultrasound and fluoroscopic guidance. PICC line placement was successful but the placement was difficult due to narrowing in the left axillary vein and likely related to prior line placements. Placement of a left arm PICC line in the future may be difficult. Electronically Signed   By: Markus Daft M.D.   On: 03/16/2015 17:34   Dg Chest Port 1 View  03/16/2015  CLINICAL DATA:  Sepsis.  Nausea and vomiting. EXAM: PORTABLE CHEST 1 VIEW COMPARISON:  03/15/2015. Multiple previous chest radiographs. Chest CT 10/26/2014 FINDINGS: Artifact overlies chest. Heart and mediastinal shadows are normal. There slight worsening of volume loss in the left lower lobe compared to the previous study. Lytic change of the right posterior fifth rib with areas of adjacent calcification probably relate to pressure erosion of the rib because in adjacent exostosis from the scapula. There could be local symptoms in this region related to that. IMPRESSION: Slight worsening of volume loss in  the left lower lobe since yesterday. Chronic lucent bone changes of the right posterior fifth rib and soft tissues presumably related to exostosis of the scapula with chronic chest wall changes. Electronically Signed   By: Nelson Chimes M.D.   On: 03/16/2015 10:36   Dg Abd Acute W/chest  03/15/2015  CLINICAL DATA:  Nausea for one day; quadriplegia EXAM: DG ABDOMEN ACUTE W/ 1V CHEST COMPARISON:  None. FINDINGS: Nonobstructive gas pattern. Moderate fecal retention. Mild stool distension of the rectum. Severe chronic bilateral hip dysplasia.No free air. IMPRESSION: Constipation.  No acute findings. Electronically Signed   By: Skipper Cliche M.D.   On: 03/15/2015 17:03     CBC  Recent Labs Lab 03/15/15 2259 03/16/15 1912 03/17/15 0835 03/18/15 0636 03/19/15 0634  WBC  --  14.9* 15.9* 9.3  --   HGB 9.2* 6.4* 7.8* 7.1* 6.7*  HCT 27.0* 22.1* 25.6* 23.4* 22.0*  PLT  --  589* 495* 449*  --   MCV  --  77.8* 80.0 80.1  --   MCH  --  22.5* 24.4* 24.3*  --   MCHC  --  29.0* 30.5 30.3  --   RDW  --  16.3* 16.0* 16.6*  --   LYMPHSABS  --  0.7  --   --   --   MONOABS  --  1.1*  --   --   --   EOSABS  --  0.5  --   --   --   BASOSABS  --  0.0  --   --   --     Chemistries   Recent Labs Lab 03/15/15 2255 03/15/15 2258 03/15/15 2259 03/17/15 0835 03/18/15 0636 03/19/15 0634  NA 137  --  141 144 137 139  K 3.7  --  4.0 3.4* 3.3* 3.3*  CL 100*  --  103 109 106 110  CO2 26  --   --  26 26 23   GLUCOSE 118*  --  118* 119* 100* 92  BUN 22*  --  27* 20 12 6   CREATININE 0.83 0.90 0.80 0.62 0.57* 0.41*  CALCIUM 8.2*  --   --  8.4* 8.5* 7.8*  MG  --   --   --   --  1.8  --   AST 14*  --   --   --   --   --   ALT 11*  --   --   --   --   --   ALKPHOS 74  --   --   --   --   --   BILITOT 0.5  --   --   --   --   --    ------------------------------------------------------------------------------------------------------------------ estimated creatinine clearance is 120.3 mL/min (by C-G formula  based on Cr of 0.41). ------------------------------------------------------------------------------------------------------------------ No results for input(s): HGBA1C in the last 72 hours. ------------------------------------------------------------------------------------------------------------------ No results for input(s): CHOL, HDL, LDLCALC, TRIG, CHOLHDL, LDLDIRECT in the last 72 hours. ------------------------------------------------------------------------------------------------------------------ No results for input(s): TSH, T4TOTAL, T3FREE, THYROIDAB in the last 72 hours.  Invalid input(s): FREET3 ------------------------------------------------------------------------------------------------------------------  Recent Labs  03/18/15 1250  VITAMINB12 1387*  FOLATE 9.4  FERRITIN 751*  TIBC 119*  IRON 15*  RETICCTPCT 2.4    Coagulation profile  Recent Labs Lab 03/16/15 1912  INR 1.35    No results for input(s): DDIMER in the last 72 hours.  Cardiac Enzymes No results for input(s): CKMB, TROPONINI, MYOGLOBIN in the last 168 hours.  Invalid input(s): CK ------------------------------------------------------------------------------------------------------------------ Invalid input(s): POCBNP   Time Spent in minutes   35   Shondell Fabel K M.D on 03/19/2015 at 9:25 AM  Between 7am to 7pm - Pager - [phone]  After 7pm go to [email or web address] - password [facility]  [facility] -  Office  [phone]

## 2015-03-20 ENCOUNTER — Ambulatory Visit: Payer: Medicare Other

## 2015-03-20 ENCOUNTER — Other Ambulatory Visit: Payer: Medicare Other

## 2015-03-20 DIAGNOSIS — G43A0 Cyclical vomiting, not intractable: Secondary | ICD-10-CM

## 2015-03-20 DIAGNOSIS — R109 Unspecified abdominal pain: Secondary | ICD-10-CM | POA: Insufficient documentation

## 2015-03-20 LAB — TYPE AND SCREEN
ABO/RH(D): B POS
Antibody Screen: NEGATIVE
Unit division: 0
Unit division: 0

## 2015-03-20 LAB — HEMOGLOBIN AND HEMATOCRIT, BLOOD
HEMATOCRIT: 25.8 % — AB (ref 39.0–52.0)
HEMOGLOBIN: 8.1 g/dL — AB (ref 13.0–17.0)

## 2015-03-20 LAB — MAGNESIUM: Magnesium: 1.8 mg/dL (ref 1.7–2.4)

## 2015-03-20 LAB — POTASSIUM: POTASSIUM: 3.2 mmol/L — AB (ref 3.5–5.1)

## 2015-03-20 MED ORDER — HEPARIN SOD (PORK) LOCK FLUSH 100 UNIT/ML IV SOLN
250.0000 [IU] | INTRAVENOUS | Status: DC | PRN
  Administered 2015-03-20: 500 [IU]

## 2015-03-20 MED ORDER — DOCUSATE SODIUM 100 MG PO CAPS: 200.0000 mg | ORAL_CAPSULE | Freq: Two times a day (BID) | ORAL | Status: DC

## 2015-03-20 MED ORDER — PIPERACILLIN-TAZOBACTAM 3.375 G IVPB: 3.3750 g | Freq: Three times a day (TID) | INTRAVENOUS | Status: DC

## 2015-03-20 MED ORDER — POTASSIUM CHLORIDE CRYS ER 20 MEQ PO TBCR
40.0000 meq | EXTENDED_RELEASE_TABLET | ORAL | Status: AC
  Administered 2015-03-20 (×2): 40 meq via ORAL
  Filled 2015-03-20 (×2): qty 2

## 2015-03-20 MED ORDER — MAGNESIUM SULFATE 2 GM/50ML IV SOLN
2.0000 g | Freq: Once | INTRAVENOUS | Status: AC
  Administered 2015-03-20: 2 g via INTRAVENOUS
  Filled 2015-03-20: qty 50

## 2015-03-20 MED ORDER — DOXYCYCLINE HYCLATE 100 MG PO TABS: 100.0000 mg | ORAL_TABLET | Freq: Two times a day (BID) | ORAL | Status: DC

## 2015-03-20 NOTE — Discharge Instructions (Signed)
Follow with Primary MD [name], MD in 3-4 days   Get CBC, CMP, 2 view Chest X ray checked  by Primary MD next visit.    Activity: As tolerated with Full fall precautions use walker/cane & assistance as needed   Disposition Home     Diet: Heart Healthy    For Heart failure patients - Check your Weight same time everyday, if you gain over 2 pounds, or you develop in leg swelling, experience more shortness of breath or chest pain, call your Primary MD immediately. Follow Cardiac Low Salt Diet and 1.5 lit/day fluid restriction.   On your next visit with your primary care physician please Get Medicines reviewed and adjusted.   Please request your Prim.MD to go over all Hospital Tests and Procedure/Radiological results at the follow up, please get all Hospital records sent to your Prim MD by signing hospital release before you go home.   If you experience worsening of your admission symptoms, develop shortness of breath, life threatening emergency, suicidal or homicidal thoughts you must seek medical attention immediately by calling 911 or calling your MD immediately  if symptoms less severe.  You Must read complete instructions/literature along with all the possible adverse reactions/side effects for all the Medicines you take and that have been prescribed to you. Take any new Medicines after you have completely understood and accpet all the possible adverse reactions/side effects.   Do not drive, operating heavy machinery, perform activities at heights, swimming or participation in water activities or provide baby sitting services if your were admitted for syncope or siezures until you have seen by Primary MD or a Neurologist and advised to do so again.  Do not drive when taking Pain medications.    Do not take more than prescribed Pain, Sleep and Anxiety Medications  Special Instructions: If you have smoked or chewed Tobacco  in the last 2 yrs please stop smoking, stop any  regular Alcohol  and or any Recreational drug use.  Wear Seat belts while driving.   Please note  You were cared for by a hospitalist during your hospital stay. If you have any questions about your discharge medications or the care you received while you were in the hospital after you are discharged, you can call the unit and asked to speak with the hospitalist on call if the hospitalist that took care of you is not available. Once you are discharged, your primary care physician will handle any further medical issues. Please note that NO REFILLS for any discharge medications will be authorized once you are discharged, as it is imperative that you return to your primary care physician (or establish a relationship with a primary care physician if you do not have one) for your aftercare needs so that they can reassess your need for medications and monitor your lab values.

## 2015-03-20 NOTE — Care Management Note (Addendum)
Case Management Note  Patient Details  Name: [name] [name] MRN: [id number] Date of Birth: 19-Apr-1966  Subjective/Objective:      Patient is active with [name] , will resume for iv abx, also will have pt, aide and Education officer, museum.  [name] with [facility] notified.  Patient will need ptar transport CSW aware.  Patient is for dc today.   Patient states he is having issues with his cpap machine and he has this with [facility].  NCM called [facility] and they directed me to  [facility] [phone].  NCM spoke with Jeani Hawking, informed her that patient is having issues with his cpap machine and she states she will put a ticket in and a rep will be out to see patient next week. NCM spoke with patient's care giver Arlee Muslim and gave her this information about the cpap and informed her that the CSW will have ptar transport for patient after 4 pm.             Action/Plan:   Expected Discharge Date:                  Expected Discharge Plan:  [place]  In-House Referral:  Clinical Social Work  Discharge planning Services  CM Consult  Post Acute Care Choice:  Resumption of Svcs/PTA Provider Choice offered to:     DME Arranged:    DME Agency:     HH Arranged:  RN, PT, Nurse's Aide, Social Work [employer] Agency:  [place]  Status of Service:  Completed, signed off  Medicare Important Message Given:    Date Medicare IM Given:    Medicare IM give by:    Date Additional Medicare IM Given:    Additional Medicare Important Message give by:     If discussed at [place] of Stay Meetings, dates discussed:    Additional Comments:  [name], RN 03/20/2015, 12:08 PM

## 2015-03-20 NOTE — Progress Notes (Signed)
Nsg Discharge Note  Admit Date:  03/15/2015 Discharge date: 03/20/2015   [name] [name] to be D/C'd Home per MD order.  AVS completed.  Copy for chart, and copy for patient signed, and dated. Patient/caregiver able to verbalize understanding.  Discharge Medication:   Medication List    TAKE these medications        amLODipine 2.5 MG tablet  Commonly known as:  NORVASC  Take 2.5 mg by mouth every morning.     baclofen 20 MG tablet  Commonly known as:  LIORESAL  Take 20 mg by mouth 4 (four) times daily.     collagenase ointment  Commonly known as:  SANTYL  Apply 1 application topically daily.     docusate sodium 100 MG capsule  Commonly known as:  COLACE  Take 100 mg by mouth 2 (two) times daily as needed for mild constipation.     docusate sodium 100 MG capsule  Commonly known as:  COLACE  Take 2 capsules (200 mg total) by mouth 2 (two) times daily.     doxycycline 100 MG tablet  Commonly known as:  VIBRA-TABS  Take 1 tablet (100 mg total) by mouth 2 (two) times daily.  Start taking on:  03/28/2015     ferrous sulfate 325 (65 FE) MG EC tablet  Take 1 tablet (325 mg total) by mouth 3 (three) times daily with meals.     furosemide 20 MG tablet  Commonly known as:  LASIX  Take 20 mg by mouth daily. As needed for fluid. Take with Klor-Con     metoCLOPramide 10 MG tablet  Commonly known as:  REGLAN  Take 1 tablet (10 mg total) by mouth 3 (three) times daily before meals.     multivitamin with minerals Tabs tablet  Take 1 tablet by mouth every morning.     lactose free nutrition Liqd  Take 237 mLs by mouth 3 (three) times daily between meals.     nutrition supplement (JUVEN) Pack  Take 1 packet by mouth 2 (two) times daily between meals.     ondansetron 4 MG disintegrating tablet  Commonly known as:  ZOFRAN ODT  Take 1 tablet (4 mg total) by mouth every 8 (eight) hours as needed for nausea.     pantoprazole 40 MG tablet  Commonly known as:  PROTONIX  Take 1  tablet (40 mg total) by mouth 2 (two) times daily before a meal.     piperacillin-tazobactam 3.375 GM/50ML IVPB  Commonly known as:  ZOSYN  Inject 50 mLs (3.375 g total) into the vein every 8 (eight) hours. Provide 7 day supply total     potassium chloride SA 20 MEQ tablet  Commonly known as:  K-DUR,KLOR-CON  Take 2 tablets (40 mEq total) by mouth daily as needed (take with lasix for fluid).     sucralfate 1 G tablet  Commonly known as:  CARAFATE  Take 1 tablet (1 g total) by mouth 4 (four) times daily.     VESICARE 10 MG tablet  Generic drug:  solifenacin  Take 10 mg by mouth daily.     vitamin C 500 MG tablet  Commonly known as:  ASCORBIC ACID  Take 500 mg by mouth every morning.     Zinc 50 MG Tabs  Take 50 mg by mouth 2 (two) times daily.        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  IV catheter discontinued intact. Site without signs and symptoms of complications - no redness or edema noted at insertion site, patient denies c/o pain - only slight tenderness at site.  Dressing with slight pressure applied.  D/c Instructions-Education: Discharge instructions given to patient/family with verbalized understanding. D/c education completed with patient/family including follow up instructions, medication list, d/c activities limitations if indicated, with other d/c instructions as indicated by MD - patient able to verbalize understanding, all questions fully answered. Patient transported via [facility] to home  [name], RN 03/20/2015 6:42 PM

## 2015-03-20 NOTE — Care Management Important Message (Signed)
Important Message  Patient Details  Name: [name] [name] MRN: [id number] Date of Birth: 10-28-1965   Medicare Important Message Given:  Yes-second notification given    [name], RN 03/20/2015, 12:11 PMImportant Message  Patient Details  Name: [name] [name] MRN: [id number] Date of Birth: December 20, 1965   Medicare Important Message Given:  Yes-second notification given    [name], RN 03/20/2015, 12:10 PM

## 2015-03-20 NOTE — Discharge Summary (Signed)
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 03/20/15 0826  Gross per 24 hour  Intake   1059 ml  Output    575 ml  Net    484 ml    Exam Awake Alert, Oriented x 3, No new F.N deficits, Normal affect Valentine.AT,PERRAL Supple Neck,No JVD, No cervical lymphadenopathy appriciated.  Symmetrical Chest wall movement, Good air movement bilaterally, CTAB RRR,No Gallops,Rubs or new Murmurs, No Parasternal Heave +ve B.Sounds, Abd Soft, Non tender, No organomegaly appriciated, No rebound -guarding or rigidity. No Cyanosis, Clubbing or edema, No new Rash or bruise   Data Review   CBC w Diff: Lab Results  Component Value Date   WBC 9.3 03/18/2015   WBC 10.2 02/02/2015   HGB 8.1* 03/20/2015   HGB 8.4* 02/02/2015   HCT 25.8* 03/20/2015   HCT 27.6* 02/02/2015   PLT 449* 03/18/2015   PLT 554* 02/02/2015   LYMPHOPCT 5 03/16/2015   LYMPHOPCT 19.7  02/02/2015   MONOPCT 7 03/16/2015   MONOPCT 14.1* 02/02/2015   EOSPCT 3 03/16/2015   EOSPCT 3.3 02/02/2015   BASOPCT 0 03/16/2015   BASOPCT 0.3 02/02/2015    CMP: Lab Results  Component Value Date   NA 139 03/19/2015   NA 137 10/07/2014   K 3.2* 03/20/2015   K 3.7 10/07/2014   CL 110 03/19/2015   CO2 23 03/19/2015   CO2 22 10/07/2014   BUN 6 03/19/2015   BUN 8.7 10/07/2014   CREATININE 0.41* 03/19/2015   CREATININE 0.6* 10/07/2014   CREATININE 0.70 06/12/2012   PROT 6.7 03/15/2015   PROT 8.0 10/07/2014   ALBUMIN 2.2* 03/15/2015   ALBUMIN 2.8* 10/07/2014   BILITOT 0.5 03/15/2015   BILITOT <0.20 10/07/2014   ALKPHOS 74 03/15/2015   ALKPHOS 80 10/07/2014   AST 14* 03/15/2015   AST 16 10/07/2014   ALT 11* 03/15/2015   ALT 7 10/07/2014  .   Total Time in preparing paper work, data evaluation and todays exam - 35 minutes  Thurnell Lose M.D on 03/20/2015 at 9:58 AM  [facility]   Office  [phone]

## 2015-03-20 NOTE — Progress Notes (Signed)
Dressing changes done patient tolerated well
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
## 2015-03-20 NOTE — Progress Notes (Signed)
Pharmacist Provided - Patient Medication Education Prior to Discharge   [name] [name] is an 49 y.o. male who presented to [facility] on 03/15/2015 with a chief complaint of  Chief Complaint  Patient presents with  . Emesis  . Weakness      [x]  Patient will be discharged with 3 new medications  The following medications were discussed with the patient: Zosyn, doxycycline, ferrous sulfate  Antibiotics at discharge: [x]  Yes    []  No  Allergy Assessment Completed and Updated: [x]  Yes    []  No Identified Patient Allergies:  Allergies  Allergen Reactions  . Ditropan [Oxybutynin] Other (See Comments)    hallucinations     Barriers to Obtaining Medications: []  Yes [x]  No  Assessment: [name] [name] is a 49yo M who will be discharged on one week of Zosyn for treatment of his recurrent UTI/chronic osteomyelits. Home health will assist him with his IV antibiotics at home. He is also chronically on doxycycline. I educated the patient on the importance of separating his ferrous sulfate from his doxycycline. I recommended he take his doxycycline 2 hours prior to his iron or 4 hours after. He understood and did not have any additional questions.  Time spent preparing for discharge counseling: 20 minutes Time spent counseling patient: 20 minutes  [name] [name], PharmD. PGY-1 Pharmacy Resident Pager: [phone] 03/20/2015, 3:34 PM

## 2015-03-21 LAB — CULTURE, BLOOD (ROUTINE X 2)
CULTURE: NO GROWTH
CULTURE: NO GROWTH

## 2015-03-25 ENCOUNTER — Ambulatory Visit: Payer: Medicare Other | Admitting: Infectious Diseases

## 2015-03-30 ENCOUNTER — Encounter (HOSPITAL_BASED_OUTPATIENT_CLINIC_OR_DEPARTMENT_OTHER): Payer: Medicare Other | Attending: Plastic Surgery

## 2015-03-30 DIAGNOSIS — M8618 Other acute osteomyelitis, other site: Secondary | ICD-10-CM | POA: Diagnosis not present

## 2015-03-30 DIAGNOSIS — L89324 Pressure ulcer of left buttock, stage 4: Secondary | ICD-10-CM | POA: Insufficient documentation

## 2015-03-30 DIAGNOSIS — L89892 Pressure ulcer of other site, stage 2: Secondary | ICD-10-CM | POA: Diagnosis not present

## 2015-03-30 DIAGNOSIS — L89154 Pressure ulcer of sacral region, stage 4: Secondary | ICD-10-CM | POA: Insufficient documentation

## 2015-03-30 DIAGNOSIS — L89620 Pressure ulcer of left heel, unstageable: Secondary | ICD-10-CM | POA: Insufficient documentation

## 2015-03-30 DIAGNOSIS — L89890 Pressure ulcer of other site, unstageable: Secondary | ICD-10-CM | POA: Diagnosis not present

## 2015-04-01 ENCOUNTER — Ambulatory Visit (INDEPENDENT_AMBULATORY_CARE_PROVIDER_SITE_OTHER): Payer: Medicare Other | Admitting: Infectious Diseases

## 2015-04-01 VITALS — BP 111/80 | HR 103 | Temp 98.6°F

## 2015-04-01 DIAGNOSIS — N390 Urinary tract infection, site not specified: Secondary | ICD-10-CM | POA: Diagnosis not present

## 2015-04-01 DIAGNOSIS — L89154 Pressure ulcer of sacral region, stage 4: Secondary | ICD-10-CM | POA: Diagnosis not present

## 2015-04-01 LAB — CBC
HEMATOCRIT: 28.6 % — AB (ref 39.0–52.0)
HEMOGLOBIN: 8.8 g/dL — AB (ref 13.0–17.0)
MCH: 23.7 pg — AB (ref 26.0–34.0)
MCHC: 30.8 g/dL (ref 30.0–36.0)
MCV: 77.1 fL — ABNORMAL LOW (ref 78.0–100.0)
MPV: 9 fL (ref 8.6–12.4)
Platelets: 512 10*3/uL — ABNORMAL HIGH (ref 150–400)
RBC: 3.71 MIL/uL — ABNORMAL LOW (ref 4.22–5.81)
RDW: 17.4 % — ABNORMAL HIGH (ref 11.5–15.5)
WBC: 15.6 10*3/uL — ABNORMAL HIGH (ref 4.0–10.5)

## 2015-04-01 LAB — BASIC METABOLIC PANEL
BUN: 12 mg/dL (ref 7–25)
CHLORIDE: 101 mmol/L (ref 98–110)
CO2: 23 mmol/L (ref 20–31)
CREATININE: 0.41 mg/dL — AB (ref 0.60–1.35)
Calcium: 8.5 mg/dL — ABNORMAL LOW (ref 8.6–10.3)
GLUCOSE: 126 mg/dL — AB (ref 65–99)
Potassium: 4.2 mmol/L (ref 3.5–5.3)
Sodium: 136 mmol/L (ref 135–146)

## 2015-04-01 LAB — MAGNESIUM: Magnesium: 1.4 mg/dL — ABNORMAL LOW (ref 1.5–2.5)

## 2015-04-01 LAB — C-REACTIVE PROTEIN: CRP: 14.6 mg/dL — AB (ref <0.60)
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
## 2015-04-01 NOTE — Assessment & Plan Note (Signed)
He appears to be doing well.  Will cont to f/u with Dr [name]. Watch off anbx at this point.  Local care only.

## 2015-04-01 NOTE — Assessment & Plan Note (Signed)
Appears to be resolved.  Will recheck only when sx.

## 2015-04-02 ENCOUNTER — Encounter: Payer: Self-pay | Admitting: Infectious Diseases

## 2015-04-02 LAB — SEDIMENTATION RATE: Sed Rate: 67 mm/hr — ABNORMAL HIGH (ref 0–15)

## 2015-04-16 IMAGING — XA IR FLUORO GUIDE CV LINE*L*
1 series · 1 of 1 positions shown · non-contrast
Comparison: none

PICC PLACEMENT WITH ULTRASOUND AND FLUOROSCOPIC  GUIDANCE
CLINICAL HISTORY: 47-year-old male with paraplegia and chronic hip
infection requiring long-term intravenous antibiotic and antifungal
medications.  This previously placed PICC catheter was accidentally
dislodged last night.  He presents to the emergency department for
PICC replacement.

[Series 300: ir fluoro guide cv line*r* · 1 of 1 slices shown]
[im 1/1]
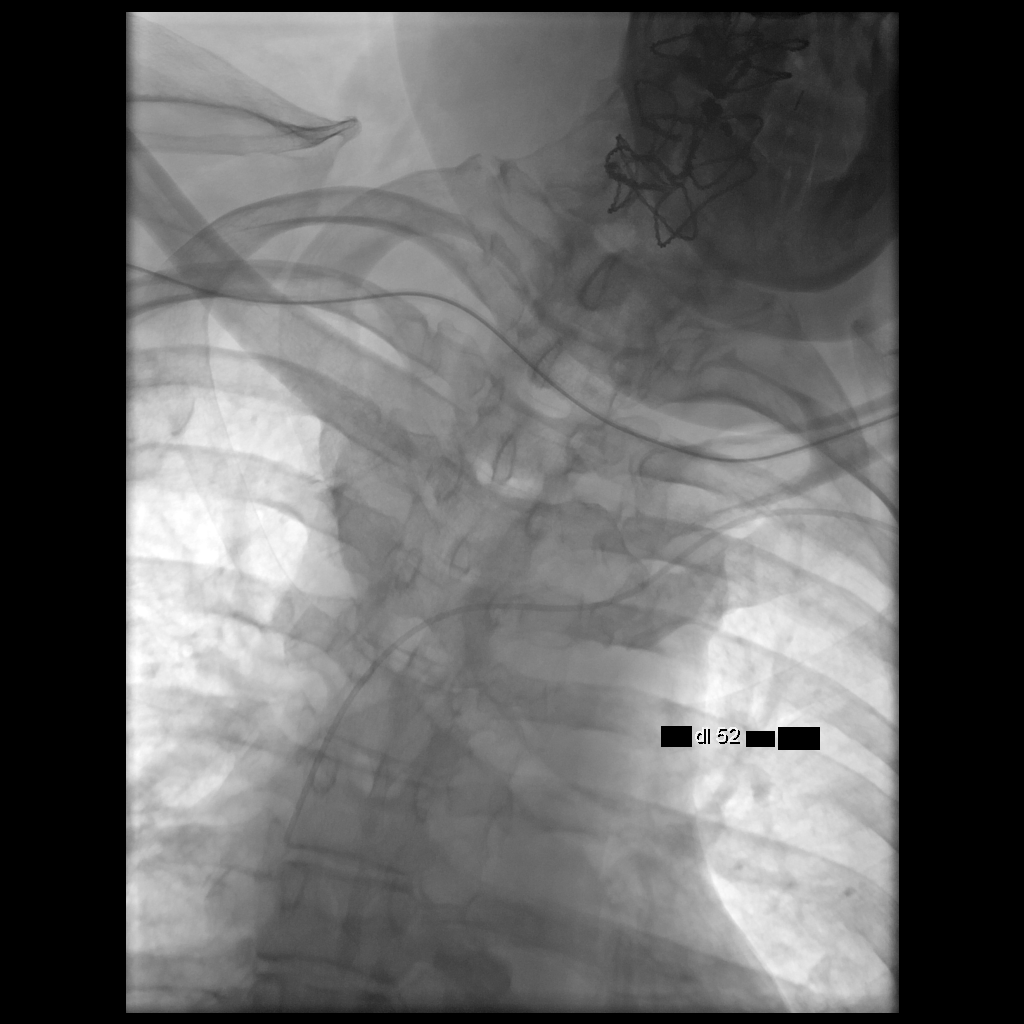

[1 of 1 positions shown; findings below may reference images not displayed]

Fluoroscopy Time: 42 seconds

Procedure:

The left arm was prepped with chlorhexidine, draped in the usual
sterile fashion using maximum barrier technique (cap and mask,
sterile gown, sterile gloves, large sterile sheet, hand hygiene and
cutaneous antiseptic).  Local anesthesia was attained by
infiltration with 1% lidocaine.

Ultrasound demonstrated patency of the left brachial vein, and this
was documented with an image.  Under real-time ultrasound guidance,
this vein was accessed with a 21 gauge micropuncture needle and
image documentation was performed.  The needle was exchanged over a
guidewire for a peel-away sheath through which a 52 cm 5 French
dual lumen power injectable PICC was advanced, and positioned with
its tip at the lower SVC/right atrial junction.  Fluoroscopy during
the procedure and fluoro spot radiograph confirms appropriate
catheter position.  The catheter was flushed, secured to the skin
with Prolene sutures, and covered with a sterile dressing.

Complications:  None.  The patient tolerated the procedure well.
IMPRESSION: Successful placement of a left arm PICC with sonographic and
fluoroscopic guidance.  The catheter is ready for use.

[REDACTED]

## 2015-04-16 IMAGING — US IR FLUORO GUIDE CV LINE*L*
1 series · 1 of 1 positions shown · non-contrast
Comparison: none

PICC PLACEMENT WITH ULTRASOUND AND FLUOROSCOPIC  GUIDANCE
CLINICAL HISTORY: 47-year-old male with paraplegia and chronic hip
infection requiring long-term intravenous antibiotic and antifungal
medications.  This previously placed PICC catheter was accidentally
dislodged last night.  He presents to the emergency department for
PICC replacement.

[Series 1: ir fluoro guide cv line*left* · 1 of 1 slices shown]
[im 1/1]
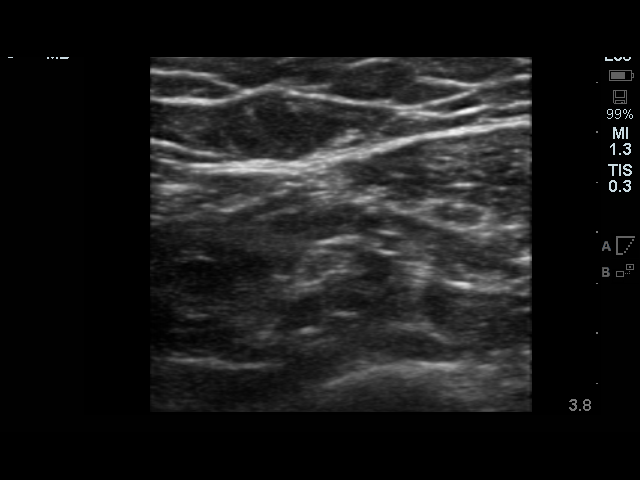

[1 of 1 positions shown; findings below may reference images not displayed]

Fluoroscopy Time: 42 seconds

Procedure:

The left arm was prepped with chlorhexidine, draped in the usual
sterile fashion using maximum barrier technique (cap and mask,
sterile gown, sterile gloves, large sterile sheet, hand hygiene and
cutaneous antiseptic).  Local anesthesia was attained by
infiltration with 1% lidocaine.

Ultrasound demonstrated patency of the left brachial vein, and this
was documented with an image.  Under real-time ultrasound guidance,
this vein was accessed with a 21 gauge micropuncture needle and
image documentation was performed.  The needle was exchanged over a
guidewire for a peel-away sheath through which a 52 cm 5 French
dual lumen power injectable PICC was advanced, and positioned with
its tip at the lower SVC/right atrial junction.  Fluoroscopy during
the procedure and fluoro spot radiograph confirms appropriate
catheter position.  The catheter was flushed, secured to the skin
with Prolene sutures, and covered with a sterile dressing.

Complications:  None.  The patient tolerated the procedure well.
IMPRESSION: Successful placement of a left arm PICC with sonographic and
fluoroscopic guidance.  The catheter is ready for use.

[REDACTED]

## 2015-04-27 ENCOUNTER — Other Ambulatory Visit: Payer: Self-pay

## 2015-04-27 ENCOUNTER — Inpatient Hospital Stay (HOSPITAL_COMMUNITY)
Admission: EM | Admit: 2015-04-27 | Discharge: 2015-05-05 | DRG: 871 | Disposition: A | Discharge status: Home Health | Payer: Medicare Other | Attending: Internal Medicine | Admitting: Internal Medicine

## 2015-04-27 ENCOUNTER — Ambulatory Visit (HOSPITAL_BASED_OUTPATIENT_CLINIC_OR_DEPARTMENT_OTHER): Payer: Medicare Other | Admitting: Hematology

## 2015-04-27 ENCOUNTER — Ambulatory Visit (HOSPITAL_BASED_OUTPATIENT_CLINIC_OR_DEPARTMENT_OTHER): Payer: Medicare Other

## 2015-04-27 ENCOUNTER — Encounter (HOSPITAL_COMMUNITY): Payer: Self-pay

## 2015-04-27 ENCOUNTER — Other Ambulatory Visit (HOSPITAL_BASED_OUTPATIENT_CLINIC_OR_DEPARTMENT_OTHER): Payer: Medicare Other

## 2015-04-27 ENCOUNTER — Emergency Department (HOSPITAL_COMMUNITY): Payer: Medicare Other

## 2015-04-27 ENCOUNTER — Telehealth: Payer: Self-pay | Admitting: Hematology

## 2015-04-27 VITALS — BP 82/49 | HR 124 | Temp 99.1°F | Resp 18 | Ht 71.0 in

## 2015-04-27 DIAGNOSIS — Z803 Family history of malignant neoplasm of breast: Secondary | ICD-10-CM | POA: Diagnosis not present

## 2015-04-27 DIAGNOSIS — M86651 Other chronic osteomyelitis, right thigh: Secondary | ICD-10-CM

## 2015-04-27 DIAGNOSIS — E876 Hypokalemia: Secondary | ICD-10-CM | POA: Diagnosis present

## 2015-04-27 DIAGNOSIS — L89612 Pressure ulcer of right heel, stage 2: Secondary | ICD-10-CM | POA: Diagnosis present

## 2015-04-27 DIAGNOSIS — Z79899 Other long term (current) drug therapy: Secondary | ICD-10-CM | POA: Diagnosis not present

## 2015-04-27 DIAGNOSIS — A419 Sepsis, unspecified organism: Principal | ICD-10-CM | POA: Diagnosis present

## 2015-04-27 DIAGNOSIS — N390 Urinary tract infection, site not specified: Secondary | ICD-10-CM | POA: Diagnosis present

## 2015-04-27 DIAGNOSIS — K219 Gastro-esophageal reflux disease without esophagitis: Secondary | ICD-10-CM | POA: Diagnosis present

## 2015-04-27 DIAGNOSIS — Z888 Allergy status to other drugs, medicaments and biological substances status: Secondary | ICD-10-CM | POA: Diagnosis not present

## 2015-04-27 DIAGNOSIS — K5900 Constipation, unspecified: Secondary | ICD-10-CM | POA: Diagnosis present

## 2015-04-27 DIAGNOSIS — J9610 Chronic respiratory failure, unspecified whether with hypoxia or hypercapnia: Secondary | ICD-10-CM | POA: Diagnosis present

## 2015-04-27 DIAGNOSIS — D72829 Elevated white blood cell count, unspecified: Secondary | ICD-10-CM | POA: Diagnosis not present

## 2015-04-27 DIAGNOSIS — D509 Iron deficiency anemia, unspecified: Secondary | ICD-10-CM | POA: Diagnosis present

## 2015-04-27 DIAGNOSIS — L89629 Pressure ulcer of left heel, unspecified stage: Secondary | ICD-10-CM | POA: Diagnosis present

## 2015-04-27 DIAGNOSIS — I10 Essential (primary) hypertension: Secondary | ICD-10-CM | POA: Diagnosis present

## 2015-04-27 DIAGNOSIS — G8250 Quadriplegia, unspecified: Secondary | ICD-10-CM

## 2015-04-27 DIAGNOSIS — G4733 Obstructive sleep apnea (adult) (pediatric): Secondary | ICD-10-CM | POA: Diagnosis present

## 2015-04-27 DIAGNOSIS — R509 Fever, unspecified: Secondary | ICD-10-CM | POA: Diagnosis not present

## 2015-04-27 DIAGNOSIS — Z933 Colostomy status: Secondary | ICD-10-CM | POA: Diagnosis not present

## 2015-04-27 DIAGNOSIS — Z833 Family history of diabetes mellitus: Secondary | ICD-10-CM | POA: Diagnosis not present

## 2015-04-27 DIAGNOSIS — D649 Anemia, unspecified: Secondary | ICD-10-CM

## 2015-04-27 DIAGNOSIS — N319 Neuromuscular dysfunction of bladder, unspecified: Secondary | ICD-10-CM | POA: Diagnosis present

## 2015-04-27 DIAGNOSIS — T83511A Infection and inflammatory reaction due to indwelling urethral catheter, initial encounter: Secondary | ICD-10-CM | POA: Diagnosis present

## 2015-04-27 DIAGNOSIS — S31000A Unspecified open wound of lower back and pelvis without penetration into retroperitoneum, initial encounter: Secondary | ICD-10-CM

## 2015-04-27 DIAGNOSIS — D473 Essential (hemorrhagic) thrombocythemia: Secondary | ICD-10-CM

## 2015-04-27 DIAGNOSIS — Y846 Urinary catheterization as the cause of abnormal reaction of the patient, or of later complication, without mention of misadventure at the time of the procedure: Secondary | ICD-10-CM | POA: Diagnosis present

## 2015-04-27 DIAGNOSIS — D638 Anemia in other chronic diseases classified elsewhere: Secondary | ICD-10-CM | POA: Diagnosis present

## 2015-04-27 DIAGNOSIS — M8668 Other chronic osteomyelitis, other site: Secondary | ICD-10-CM | POA: Diagnosis present

## 2015-04-27 DIAGNOSIS — L89154 Pressure ulcer of sacral region, stage 4: Secondary | ICD-10-CM | POA: Diagnosis present

## 2015-04-27 DIAGNOSIS — I959 Hypotension, unspecified: Secondary | ICD-10-CM | POA: Diagnosis present

## 2015-04-27 DIAGNOSIS — M86659 Other chronic osteomyelitis, unspecified thigh: Secondary | ICD-10-CM | POA: Diagnosis present

## 2015-04-27 DIAGNOSIS — Z87440 Personal history of urinary (tract) infections: Secondary | ICD-10-CM

## 2015-04-27 DIAGNOSIS — Z8711 Personal history of peptic ulcer disease: Secondary | ICD-10-CM | POA: Diagnosis not present

## 2015-04-27 DIAGNOSIS — R6520 Severe sepsis without septic shock: Secondary | ICD-10-CM

## 2015-04-27 DIAGNOSIS — L89103 Pressure ulcer of unspecified part of back, stage 3: Secondary | ICD-10-CM | POA: Diagnosis present

## 2015-04-27 DIAGNOSIS — R6521 Severe sepsis with septic shock: Secondary | ICD-10-CM | POA: Diagnosis present

## 2015-04-27 DIAGNOSIS — Z8701 Personal history of pneumonia (recurrent): Secondary | ICD-10-CM | POA: Diagnosis not present

## 2015-04-27 LAB — CBG MONITORING, ED: GLUCOSE-CAPILLARY: 81 mg/dL (ref 65–99)

## 2015-04-27 LAB — CBC & DIFF AND RETIC
BASO%: 0.3 % (ref 0.0–2.0)
Basophils Absolute: 0.1 10*3/uL (ref 0.0–0.1)
EOS%: 0.8 % (ref 0.0–7.0)
Eosinophils Absolute: 0.2 10*3/uL (ref 0.0–0.5)
HEMATOCRIT: 29.8 % — AB (ref 38.4–49.9)
HGB: 9.1 g/dL — ABNORMAL LOW (ref 13.0–17.1)
IMMATURE RETIC FRACT: 21.1 % — AB (ref 3.00–10.60)
LYMPH%: 7.2 % — ABNORMAL LOW (ref 14.0–49.0)
MCH: 23.4 pg — ABNORMAL LOW (ref 27.2–33.4)
MCHC: 30.5 g/dL — AB (ref 32.0–36.0)
MCV: 76.6 fL — ABNORMAL LOW (ref 79.3–98.0)
MONO#: 2.1 10*3/uL — ABNORMAL HIGH (ref 0.1–0.9)
MONO%: 10.4 % (ref 0.0–14.0)
NEUT#: 16.1 10*3/uL — ABNORMAL HIGH (ref 1.5–6.5)
NEUT%: 81.3 % — ABNORMAL HIGH (ref 39.0–75.0)
PLATELETS: 694 10*3/uL — AB (ref 140–400)
RBC: 3.89 10*6/uL — AB (ref 4.20–5.82)
RDW: 17 % — AB (ref 11.0–14.6)
RETIC %: 1.7 % (ref 0.80–1.80)
Retic Ct Abs: 66.13 10*3/uL (ref 34.80–93.90)
WBC: 19.8 10*3/uL — AB (ref 4.0–10.3)
lymph#: 1.4 10*3/uL (ref 0.9–3.3)
nRBC: 0 % (ref 0–0)

## 2015-04-27 LAB — CBC WITH DIFFERENTIAL/PLATELET
BASOS ABS: 0 10*3/uL (ref 0.0–0.1)
BASOS PCT: 0 %
Eosinophils Absolute: 0 10*3/uL (ref 0.0–0.7)
Eosinophils Relative: 0 %
HEMATOCRIT: 25.8 % — AB (ref 39.0–52.0)
HEMOGLOBIN: 7.7 g/dL — AB (ref 13.0–17.0)
LYMPHS PCT: 4 %
Lymphs Abs: 0.7 10*3/uL (ref 0.7–4.0)
MCH: 22.9 pg — ABNORMAL LOW (ref 26.0–34.0)
MCHC: 29.8 g/dL — ABNORMAL LOW (ref 30.0–36.0)
MCV: 76.8 fL — AB (ref 78.0–100.0)
MONOS PCT: 10 %
Monocytes Absolute: 1.7 10*3/uL — ABNORMAL HIGH (ref 0.1–1.0)
NEUTROS ABS: 15 10*3/uL — AB (ref 1.7–7.7)
NEUTROS PCT: 86 %
Platelets: 692 10*3/uL — ABNORMAL HIGH (ref 150–400)
RBC: 3.36 MIL/uL — ABNORMAL LOW (ref 4.22–5.81)
RDW: 16.8 % — ABNORMAL HIGH (ref 11.5–15.5)
WBC: 17.5 10*3/uL — ABNORMAL HIGH (ref 4.0–10.5)

## 2015-04-27 LAB — URINALYSIS, ROUTINE W REFLEX MICROSCOPIC
Glucose, UA: NEGATIVE mg/dL
Hgb urine dipstick: NEGATIVE
KETONES UR: NEGATIVE mg/dL
NITRITE: POSITIVE — AB
PROTEIN: 30 mg/dL — AB
SPECIFIC GRAVITY, URINE: 1.021 (ref 1.005–1.030)
pH: 8.5 — ABNORMAL HIGH (ref 5.0–8.0)

## 2015-04-27 LAB — COMPREHENSIVE METABOLIC PANEL
ALT: 11 U/L — AB (ref 17–63)
AST: 20 U/L (ref 15–41)
Albumin: 2.6 g/dL — ABNORMAL LOW (ref 3.5–5.0)
Alkaline Phosphatase: 80 U/L (ref 38–126)
Anion gap: 10 (ref 5–15)
BILIRUBIN TOTAL: 0.2 mg/dL — AB (ref 0.3–1.2)
BUN: 16 mg/dL (ref 6–20)
CALCIUM: 8.9 mg/dL (ref 8.9–10.3)
CO2: 24 mmol/L (ref 22–32)
Chloride: 105 mmol/L (ref 101–111)
Creatinine, Ser: 0.67 mg/dL (ref 0.61–1.24)
Glucose, Bld: 135 mg/dL — ABNORMAL HIGH (ref 65–99)
POTASSIUM: 3.5 mmol/L (ref 3.5–5.1)
Sodium: 139 mmol/L (ref 135–145)
TOTAL PROTEIN: 7.7 g/dL (ref 6.5–8.1)

## 2015-04-27 LAB — URINE MICROSCOPIC-ADD ON

## 2015-04-27 LAB — FERRITIN: Ferritin: 1838 ng/ml — ABNORMAL HIGH (ref 22–316)

## 2015-04-27 LAB — I-STAT CG4 LACTIC ACID, ED: Lactic Acid, Venous: 2.65 mmol/L (ref 0.5–2.0)

## 2015-04-27 LAB — IRON AND TIBC
%SAT: 9 % — ABNORMAL LOW (ref 20–55)
IRON: 14 ug/dL — AB (ref 42–163)
TIBC: 161 ug/dL — AB (ref 202–409)
UIBC: 147 ug/dL (ref 117–376)

## 2015-04-27 MED ORDER — BOOST PO LIQD
237.0000 mL | Freq: Three times a day (TID) | ORAL | Status: DC
  Administered 2015-04-29 – 2015-05-05 (×16): 237 mL via ORAL
  Filled 2015-04-27 (×24): qty 237

## 2015-04-27 MED ORDER — METOCLOPRAMIDE HCL 10 MG PO TABS
10.0000 mg | ORAL_TABLET | Freq: Three times a day (TID) | ORAL | Status: DC
  Administered 2015-04-28 – 2015-05-05 (×23): 10 mg via ORAL
  Filled 2015-04-27 (×23): qty 1

## 2015-04-27 MED ORDER — HEPARIN SODIUM (PORCINE) 5000 UNIT/ML IJ SOLN
5000.0000 [IU] | Freq: Three times a day (TID) | INTRAMUSCULAR | Status: DC
  Administered 2015-04-28 – 2015-05-05 (×23): 5000 [IU] via SUBCUTANEOUS
  Filled 2015-04-27 (×26): qty 1

## 2015-04-27 MED ORDER — ONDANSETRON 4 MG PO TBDP
4.0000 mg | ORAL_TABLET | Freq: Three times a day (TID) | ORAL | Status: DC | PRN
  Filled 2015-04-27: qty 1

## 2015-04-27 MED ORDER — SUCRALFATE 1 G PO TABS
1.0000 g | ORAL_TABLET | Freq: Four times a day (QID) | ORAL | Status: DC
Start: 2015-04-27 — End: 2015-05-05
  Administered 2015-04-28 – 2015-05-05 (×31): 1 g via ORAL
  Filled 2015-04-27 (×31): qty 1

## 2015-04-27 MED ORDER — DARBEPOETIN ALFA 100 MCG/0.5ML IJ SOSY
100.0000 ug | PREFILLED_SYRINGE | Freq: Once | INTRAMUSCULAR | Status: AC
  Administered 2015-04-27: 100 ug via SUBCUTANEOUS
  Filled 2015-04-27: qty 0.5

## 2015-04-27 MED ORDER — VANCOMYCIN HCL IN DEXTROSE 1-5 GM/200ML-% IV SOLN
1000.0000 mg | Freq: Once | INTRAVENOUS | Status: AC
  Administered 2015-04-27: 1000 mg via INTRAVENOUS
  Filled 2015-04-27: qty 200

## 2015-04-27 MED ORDER — COLLAGENASE 250 UNIT/GM EX OINT
1.0000 "application " | TOPICAL_OINTMENT | Freq: Every day | CUTANEOUS | Status: DC
  Administered 2015-04-28 – 2015-05-05 (×8): 1 via TOPICAL
  Filled 2015-04-27 (×3): qty 30

## 2015-04-27 MED ORDER — BACLOFEN 10 MG PO TABS
20.0000 mg | ORAL_TABLET | Freq: Four times a day (QID) | ORAL | Status: DC
  Administered 2015-04-28 – 2015-05-04 (×25): 20 mg via ORAL
  Administered 2015-05-04: 10 mg via ORAL
  Administered 2015-05-04 – 2015-05-05 (×5): 20 mg via ORAL
  Filled 2015-04-27 (×3): qty 2
  Filled 2015-04-27: qty 1
  Filled 2015-04-27 (×15): qty 2
  Filled 2015-04-27: qty 1
  Filled 2015-04-27 (×6): qty 2
  Filled 2015-04-27: qty 1
  Filled 2015-04-27 (×2): qty 2
  Filled 2015-04-27 (×2): qty 1
  Filled 2015-04-27 (×5): qty 2

## 2015-04-27 MED ORDER — FERROUS SULFATE 325 (65 FE) MG PO TABS
325.0000 mg | ORAL_TABLET | Freq: Three times a day (TID) | ORAL | Status: DC
  Administered 2015-04-28 – 2015-05-05 (×23): 325 mg via ORAL
  Filled 2015-04-27 (×26): qty 1

## 2015-04-27 MED ORDER — SODIUM CHLORIDE 0.9 % IV SOLN
INTRAVENOUS | Status: DC
  Administered 2015-04-27 – 2015-05-03 (×8): via INTRAVENOUS

## 2015-04-27 MED ORDER — SODIUM CHLORIDE 0.9 % IV BOLUS (SEPSIS)
1000.0000 mL | INTRAVENOUS | Status: AC
  Administered 2015-04-27 (×3): 1000 mL via INTRAVENOUS

## 2015-04-27 MED ORDER — DOCUSATE SODIUM 100 MG PO CAPS
200.0000 mg | ORAL_CAPSULE | Freq: Two times a day (BID) | ORAL | Status: DC
  Administered 2015-04-28 – 2015-05-05 (×16): 200 mg via ORAL
  Filled 2015-04-27 (×16): qty 2

## 2015-04-27 MED ORDER — OXYCODONE HCL 5 MG PO TABS: 10.0000 mg | ORAL_TABLET | Freq: Three times a day (TID) | ORAL | Status: DC | PRN

## 2015-04-27 MED ORDER — ADULT MULTIVITAMIN W/MINERALS CH
1.0000 | ORAL_TABLET | Freq: Every morning | ORAL | Status: DC
  Administered 2015-04-28 – 2015-05-05 (×8): 1 via ORAL
  Filled 2015-04-27 (×9): qty 1

## 2015-04-27 MED ORDER — ACETAMINOPHEN 500 MG PO TABS
1000.0000 mg | ORAL_TABLET | Freq: Once | ORAL | Status: AC
  Administered 2015-04-27: 1000 mg via ORAL
  Filled 2015-04-27: qty 2

## 2015-04-27 MED ORDER — JUVEN PO PACK
1.0000 | PACK | Freq: Two times a day (BID) | ORAL | Status: DC
  Administered 2015-04-28: 1 via ORAL
  Filled 2015-04-27 (×4): qty 1

## 2015-04-27 MED ORDER — PANTOPRAZOLE SODIUM 40 MG PO TBEC
40.0000 mg | DELAYED_RELEASE_TABLET | Freq: Two times a day (BID) | ORAL | Status: DC
  Administered 2015-04-28 – 2015-05-05 (×15): 40 mg via ORAL
  Filled 2015-04-27 (×15): qty 1

## 2015-04-27 MED ORDER — PIPERACILLIN-TAZOBACTAM 3.375 G IVPB
3.3750 g | Freq: Once | INTRAVENOUS | Status: AC
  Administered 2015-04-27: 3.375 g via INTRAVENOUS
  Filled 2015-04-27: qty 50

## 2015-04-27 NOTE — ED Notes (Signed)
Writer attempted to draw I-stat, unsuccessful attempt

## 2015-04-27 NOTE — ED Notes (Signed)
Pt presents with c/o hypotension. Pt reports he went to the [place] for an anemia shot and was told that he was hypertensive. Cancer center brought pt over to ER and left him in the lobby to check in and be seen by a physician. BP of 82/49 at cancer center. Pt c/o being cold and clammy at this time.

## 2015-04-27 NOTE — ED Notes (Signed)
Dr. Oleta Mouse notified that we were unable to get IV access. IV team unsuccessful.

## 2015-04-27 NOTE — ED Notes (Signed)
Main lab called for iv stick.

## 2015-04-27 NOTE — ED Notes (Signed)
IV team at bedside 

## 2015-04-27 NOTE — ED Notes (Signed)
MD made aware of hypotension.
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
## 2015-04-27 NOTE — ED Notes (Signed)
RN and EDP made aware of critical result

## 2015-04-27 NOTE — ED Notes (Signed)
Dr. Liu at bedside attempting US IV 
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
## 2015-04-27 NOTE — Telephone Encounter (Signed)
per pof to sch pt appt-gave pt copy of avs °

## 2015-04-27 NOTE — ED Notes (Signed)
I ATTEMPTED TWICE TO COLLECT LABS ON PATIENT AND WAS UNSUCCESSFUL.

## 2015-04-28 ENCOUNTER — Inpatient Hospital Stay (HOSPITAL_COMMUNITY): Payer: Medicare Other

## 2015-04-28 LAB — MRSA PCR SCREENING: MRSA BY PCR: NEGATIVE

## 2015-04-28 LAB — PREPARE RBC (CROSSMATCH)

## 2015-04-28 LAB — I-STAT CG4 LACTIC ACID, ED: Lactic Acid, Venous: 0.71 mmol/L (ref 0.5–2.0)

## 2015-04-28 MED ORDER — SODIUM CHLORIDE 0.9 % IV SOLN
Freq: Once | INTRAVENOUS | Status: AC
  Administered 2015-04-28: 21:00:00 via INTRAVENOUS

## 2015-04-28 MED ORDER — ACETAMINOPHEN 325 MG PO TABS
650.0000 mg | ORAL_TABLET | ORAL | Status: DC | PRN
  Administered 2015-04-28: 650 mg via ORAL
  Filled 2015-04-28: qty 2

## 2015-04-28 MED ORDER — VANCOMYCIN HCL IN DEXTROSE 1-5 GM/200ML-% IV SOLN
1000.0000 mg | Freq: Two times a day (BID) | INTRAVENOUS | Status: DC
  Administered 2015-04-28 – 2015-04-30 (×5): 1000 mg via INTRAVENOUS
  Filled 2015-04-28 (×6): qty 200

## 2015-04-28 MED ORDER — LIDOCAINE HCL 1 % IJ SOLN
INTRAMUSCULAR | Status: AC
  Filled 2015-04-28: qty 20

## 2015-04-28 MED ORDER — PIPERACILLIN-TAZOBACTAM 3.375 G IVPB
3.3750 g | Freq: Three times a day (TID) | INTRAVENOUS | Status: DC
  Administered 2015-04-28 – 2015-05-05 (×22): 3.375 g via INTRAVENOUS
  Filled 2015-04-28 (×23): qty 50

## 2015-04-28 NOTE — ED Notes (Addendum)
Writer called main lab for am blood work. Pt is a very difficult stick.
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
## 2015-04-28 NOTE — Progress Notes (Signed)
Notified [facility] of pt admission so pt can be followed for d/c needs

## 2015-04-28 NOTE — Consult Note (Signed)
WOC wound consult note Reason for Consult: Well known to our department for wound care; seen several times each year for chronic pressure injury assessment. New area at right thoracic chest is suspicious for infectious process. Wound type:Pressure Pressure Ulcer POA: Yes Measurement: left heel:  4cm x 3cm x 0.2cm 100% pink Right 5th metatarsal head:  1.5cm x 1cm with unknown depth due to the presence of necrotic tissue obscuring depth. Right medial heel:  5.5cm x 2cm:  Distal end is a serum-filled blister, proximal end is partial thickness tissue loss. Right thoracic area:  8cm x 8cm area of soft brown eschar with odor consistent with necrotic tissue. Slightly fluctuant. Left IT: 7cm x 10cm x 1cm red, moist with undermining at 5 o'clock measuring 4cm  75% red, 25% eschar Sacrum: 10cm x 20cm x 3cm 75% red, 25% eschar Right IT: 14cm x 12cm x 9cm at 3 o'clock 75% red, 25% yellow or grey eschar Right lateral LE:  1.5cm x 1cm x 0.2cm 100% pink Wound bed:  Drainage (amount, consistency, odor) All areas with serous exudate, musty odor Periwound: Intact, dry Dressing procedure/placement/frequency: The area of greatest concern is the new (approximately 1 month, according to patient) area on the right thoracic area.  This 8cm x 8cm area of soft brown eschar is slightly fluctuant and should be evaluated by surgery or Plastics.  Patient is followed closely by Dr. [name] (he had an appointment there this week than was moved to January 19th). Patient refuses pressure redistribution heel boots as he says he has a relative new pair at home.  We will float heels while here. The buttocks are completely affected with pressure injuries and the anatomy is significantly altered.  For this reason, it is difficult to describe without photography the landmarks and most measurements are estimations. Orders are on chart for the obtaining of a mattress replacement with low air loss feature upon discharge from the unit.   He is on a therapeutic mattress with low air loss feature while in ICU.  Total time spent in assessment, dressing and measuring of wounds is 75 minutes, not inclusive of documentation.  [facility] nursing team will not follow, but will remain available to this patient, the nursing and medical teams.  Please re-consult if needed. Thanks, Maudie Flakes, MSN, RN, [place], [facility] Abbott  Pager# [phone]

## 2015-04-28 NOTE — ED Notes (Signed)
Lab unsuccessful with blood draw attempt. Will notify hospitalist and need for PICC line.

## 2015-04-28 NOTE — ED Notes (Addendum)
Pt's transfer on hold to ICU until further notice, per JM.

## 2015-04-28 NOTE — Procedures (Signed)
Placement of left brachial PICC.  Length = 53 cm.  Tip at SVC/RA junction.  Ready to use.  No immediate complication.
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
## 2015-04-28 NOTE — ED Notes (Signed)
Report given to [place], ICU. Pt ready for transport.

## 2015-04-29 DIAGNOSIS — K219 Gastro-esophageal reflux disease without esophagitis: Secondary | ICD-10-CM

## 2015-04-29 DIAGNOSIS — L8990 Pressure ulcer of unspecified site, unspecified stage: Secondary | ICD-10-CM

## 2015-04-29 DIAGNOSIS — G4733 Obstructive sleep apnea (adult) (pediatric): Secondary | ICD-10-CM

## 2015-04-29 LAB — CBC
HEMATOCRIT: 24.7 % — AB (ref 39.0–52.0)
HEMOGLOBIN: 7.7 g/dL — AB (ref 13.0–17.0)
MCH: 24.5 pg — ABNORMAL LOW (ref 26.0–34.0)
MCHC: 31.2 g/dL (ref 30.0–36.0)
MCV: 78.7 fL (ref 78.0–100.0)
Platelets: 405 10*3/uL — ABNORMAL HIGH (ref 150–400)
RBC: 3.14 MIL/uL — ABNORMAL LOW (ref 4.22–5.81)
RDW: 16.2 % — ABNORMAL HIGH (ref 11.5–15.5)
WBC: 9.7 10*3/uL (ref 4.0–10.5)

## 2015-04-29 LAB — BASIC METABOLIC PANEL
ANION GAP: 7 (ref 5–15)
BUN: 10 mg/dL (ref 6–20)
CO2: 24 mmol/L (ref 22–32)
Calcium: 8 mg/dL — ABNORMAL LOW (ref 8.9–10.3)
Chloride: 109 mmol/L (ref 101–111)
Creatinine, Ser: 0.38 mg/dL — ABNORMAL LOW (ref 0.61–1.24)
GFR calc Af Amer: >60 mL/min (ref >60)
GLUCOSE: 101 mg/dL — AB (ref 65–99)
POTASSIUM: 2.7 mmol/L — AB (ref 3.5–5.1)
Sodium: 140 mmol/L (ref 135–145)

## 2015-04-29 LAB — PREPARE RBC (CROSSMATCH)

## 2015-04-29 MED ORDER — POTASSIUM CHLORIDE CRYS ER 20 MEQ PO TBCR
40.0000 meq | EXTENDED_RELEASE_TABLET | Freq: Two times a day (BID) | ORAL | Status: AC
  Administered 2015-04-29 (×2): 40 meq via ORAL
  Filled 2015-04-29 (×2): qty 2

## 2015-04-29 MED ORDER — JUVEN PO PACK
2.0000 | PACK | Freq: Two times a day (BID) | ORAL | Status: DC
  Administered 2015-04-30 – 2015-05-05 (×6): 2 via ORAL
  Filled 2015-04-29 (×15): qty 2

## 2015-04-29 MED ORDER — POTASSIUM CHLORIDE 20 MEQ/15ML (10%) PO SOLN
40.0000 meq | Freq: Once | ORAL | Status: AC
  Administered 2015-04-29: 40 meq via ORAL
  Filled 2015-04-29: qty 30

## 2015-04-29 MED ORDER — SODIUM CHLORIDE 0.9 % IV SOLN: Freq: Once | INTRAVENOUS | Status: DC

## 2015-04-29 NOTE — Progress Notes (Signed)
Pt placed on CPAP qhs.  Machine plugged into red outlet, humidifier filled with sterile water, and machine set to CPAP of 19 cm H2O per Pt stated home settings.  Pt resting stable and comfortable.  RT will continue to monitor as needed.

## 2015-04-29 NOTE — Care Management Note (Signed)
Case Management Note  Patient Details  Name: MINORU QUAIN MRN: [id number] Date of Birth: [birth year]  Subjective/Objective:                Anemia and hypotension    Action/Plan: Date: April 29, 2015 Chart reviewed for concurrent status and case management needs. Will continue to follow patient for changes and needs: Velva Harman, RN, BSN, [place]   [phone]   Expected Discharge Date:   (unknown)               Expected Discharge Plan:  [place]  In-House Referral:  NA  Discharge planning Services  CM Consult  Post Acute Care Choice:  NA Choice offered to:  NA  DME Arranged:    DME Agency:     HH Arranged:    [place] Agency:     Status of Service:  In process, will continue to follow  Medicare Important Message Given:    Date Medicare IM Given:    Medicare IM give by:    Date Additional Medicare IM Given:    Additional Medicare Important Message give by:     If discussed at [place] of Stay Meetings, dates discussed:    Additional Comments:  Leeroy Cha, RN 04/29/2015, 8:55 AM

## 2015-04-29 NOTE — Progress Notes (Signed)
Initial Nutrition Assessment  DOCUMENTATION CODES:   Not applicable  INTERVENTION:  -Ensure Enlive po BID, each supplement provides 350 kcal and 20 grams of protein -Diet Advancement per MD -Replete K, Mg, per MD   NUTRITION DIAGNOSIS:   Increased nutrient needs related to wound healing as evidenced by estimated needs.  GOAL:   Patient will meet greater than or equal to 90% of their needs  MONITOR:   PO intake, Supplement acceptance, I & O's, Labs  REASON FOR ASSESSMENT:   Malnutrition Screening Tool    ASSESSMENT:   [name] [name] is a 49 y.o. male with h/o quadriplegia, complicated by stage 4 decubitus ulcers with chronic osteomyelitis, numerous infections and development of MDRO over the past year especially. He was being seen at the [facility] today for an anemia shot (patient is being seen by hematology for anemia of chronic disease), and they noted that his BP was low. Per home health aids his decubitus ulcers had been putting out more foul smelling drainage of late (as they tend to do when his osteomyelitis flares up).  Pt was in rad during visit.  Unable to complete Nutrition-Focused physical exam at this time.  Per chart, pt does not have decreased appetite. Pt does exhibit 47#/19% severe wt loss in 5 months - likely related to stg IV ulcers and osteomyelitis.  Pt needs increased significantly, will provide juven 2 packets x2 for increased protein needs.  Labs: K 2.7, Ca 8, Mg 1.4, Medications reviewed.   Diet Order:  Diet regular Room service appropriate?: Yes; Fluid consistency:: Thin  Skin:   Multiple Stg 4 decubitus ulcers, chronic osteomyelitis.  Last BM:  04/28/2015  Height:   Ht Readings from Last 1 Encounters:  04/28/15 6\' 1"  (1.854 m)    Weight:   Wt Readings from Last 1 Encounters:  04/28/15 203 lb 14.4 oz (92.488 kg)    Ideal Body Weight:  83.64 kg  BMI:  Body mass index is 26.91 kg/(m^2).  Estimated Nutritional Needs:    Kcal:  1800-2100 calories  Protein:  80-92 grams  Fluid:  >/= 1.8L  EDUCATION NEEDS:   No education needs identified at this time  Satira Anis. Marce Schartz, MS, RD LDN After Hours/Weekend Pager [phone]

## 2015-04-29 NOTE — Progress Notes (Signed)
CRITICAL VALUE ALERT  Critical value received: Potassium 2.7  Date of notification:  04/29/15    Time of notification:  0630  Critical value read back:Yes.    Nurse who received alert:  S.Abbigail Anstey,RN  MD notified (1st page):  Maryjane Hurter  Time of first page:  [phone]  MD notified (2nd page):  Time of second page:  Responding MD:  Maryjane Hurter  Time MD responded:  [phone]
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
## 2015-04-30 DIAGNOSIS — E876 Hypokalemia: Secondary | ICD-10-CM

## 2015-04-30 DIAGNOSIS — D72829 Elevated white blood cell count, unspecified: Secondary | ICD-10-CM

## 2015-04-30 LAB — CBC
HCT: 28.2 % — ABNORMAL LOW (ref 39.0–52.0)
Hemoglobin: 8.9 g/dL — ABNORMAL LOW (ref 13.0–17.0)
MCH: 25.4 pg — AB (ref 26.0–34.0)
MCHC: 31.6 g/dL (ref 30.0–36.0)
MCV: 80.6 fL (ref 78.0–100.0)
PLATELETS: 464 10*3/uL — AB (ref 150–400)
RBC: 3.5 MIL/uL — ABNORMAL LOW (ref 4.22–5.81)
RDW: 16.4 % — AB (ref 11.5–15.5)
WBC: 12.5 10*3/uL — AB (ref 4.0–10.5)

## 2015-04-30 LAB — TYPE AND SCREEN
ABO/RH(D): B POS
Antibody Screen: NEGATIVE
UNIT DIVISION: 0
Unit division: 0
Unit division: 0

## 2015-04-30 LAB — BASIC METABOLIC PANEL
Anion gap: 7 (ref 5–15)
BUN: 9 mg/dL (ref 6–20)
CALCIUM: 8.2 mg/dL — AB (ref 8.9–10.3)
CHLORIDE: 110 mmol/L (ref 101–111)
CO2: 25 mmol/L (ref 22–32)
CREATININE: 0.48 mg/dL — AB (ref 0.61–1.24)
GFR calc Af Amer: >60 mL/min (ref >60)
GFR calc non Af Amer: >60 mL/min (ref >60)
Glucose, Bld: 102 mg/dL — ABNORMAL HIGH (ref 65–99)
Potassium: 3.8 mmol/L (ref 3.5–5.1)
SODIUM: 142 mmol/L (ref 135–145)
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
## 2015-04-30 NOTE — Progress Notes (Signed)
Placed pt on Cpap 19.0 cmh20.  Tolerating well no issues to report.

## 2015-04-30 NOTE — Progress Notes (Signed)
[facility]  Patient Status: Active with [facility]    [facility] is providing the following services: HHRN and now will add home infusion pharmacy for home IV ABX.  [name] [name] has been with [facility] for home IV ABX on numerous occasions.  [name], his primary caregiver, is well versed with  IV ABX administration and will support [name] [name] at DC for set up and administration as ordered.  [facility] hospital team will follow [name] [name] while an inpatient to support DC home when ordered.  If patient discharges after hours, please call [phone].   Larry Sierras 04/30/2015, 8:06 AM

## 2015-04-30 NOTE — Care Management Important Message (Signed)
Important Message  Patient Details IM Letter given to [name]/Case Manager to present to [place] Message  Patient Details  Name: [name] [name] MRN: [id number] Date of Birth: [birth year]   Medicare Important Message Given:  Yes    [name] 04/30/2015, 12:10 PM Name: [name] [name] MRN: [id number] Date of Birth: Mar 30, 1966   Medicare Important Message Given:  Yes    [name] 04/30/2015, 12:09 PM

## 2015-04-30 NOTE — Progress Notes (Signed)
Report given to [place], Therapist, sports and all questions answered. Pt transferred via bed with all belongings and electric wheelchair.

## 2015-04-30 NOTE — Progress Notes (Signed)
Pharmacy Antibiotic Follow-up Note  [name] [name] is a 49 y.o. year-old male admitted on 04/27/2015.  The patient is currently on day 4 of Vanc and Zosyn for sepsis, suspected UTI, osteomyelitis.  Assessment/Plan: SCr <1(parapalegic), CrCl >100CG, ~100N(rounding SCr to 0.8). Hypotension resolved. Urine growing Providencia sensitive to Zosyn.  Paged Dr. Dyann Kief suggesting stopping Vanc and he did so.   Temp (24hrs), Avg:98.7 F (37.1 C), Min:98.1 F (36.7 C), Max:98.9 F (37.2 C)   Recent Labs Lab 04/27/15 1342 04/27/15 1657 04/29/15 0540 04/30/15 0448  WBC 19.8* 17.5* 9.7 12.5*    Recent Labs Lab 04/27/15 1657 04/29/15 0540 04/30/15 0448  CREATININE 0.67 0.38* 0.48*   Estimated Creatinine Clearance: 127.6 mL/min (by C-G formula based on Cr of 0.48).    Allergies  Allergen Reactions  . Ditropan [Oxybutynin] Other (See Comments)    hallucinations    Antimicrobials this admission: Zosyn 12/12 >>  Vanc 12/12 >> 12/15  Levels/dose changes this admission:   Microbiology results: 12/12 BCx: ngtd 12/12 UCx: small amounts of Proteus. Significant Providencia, sens to CTX, imipenem, Zosyn  12/13 MRSA PCR: neg  Thank you for allowing pharmacy to be a part of this patient's care.  Romeo Rabon, PharmD, pager [phone]. 04/30/2015,12:24 PM.

## 2015-05-01 LAB — CBC
HCT: 27.3 % — ABNORMAL LOW (ref 39.0–52.0)
Hemoglobin: 8.7 g/dL — ABNORMAL LOW (ref 13.0–17.0)
MCH: 25.7 pg — ABNORMAL LOW (ref 26.0–34.0)
MCHC: 31.9 g/dL (ref 30.0–36.0)
MCV: 80.8 fL (ref 78.0–100.0)
Platelets: 466 10*3/uL — ABNORMAL HIGH (ref 150–400)
RBC: 3.38 MIL/uL — ABNORMAL LOW (ref 4.22–5.81)
RDW: 17.1 % — AB (ref 11.5–15.5)
WBC: 12.2 10*3/uL — ABNORMAL HIGH (ref 4.0–10.5)

## 2015-05-01 LAB — BASIC METABOLIC PANEL
ANION GAP: 7 (ref 5–15)
BUN: 11 mg/dL (ref 6–20)
CALCIUM: 8.2 mg/dL — AB (ref 8.9–10.3)
CO2: 24 mmol/L (ref 22–32)
Chloride: 109 mmol/L (ref 101–111)
Creatinine, Ser: 0.45 mg/dL — ABNORMAL LOW (ref 0.61–1.24)
GFR calc Af Amer: >60 mL/min (ref >60)
GLUCOSE: 126 mg/dL — AB (ref 65–99)
Potassium: 3 mmol/L — ABNORMAL LOW (ref 3.5–5.1)
Sodium: 140 mmol/L (ref 135–145)

## 2015-05-01 LAB — URINE CULTURE: Culture: >=100000

## 2015-05-01 LAB — MAGNESIUM: MAGNESIUM: 1 mg/dL — AB (ref 1.7–2.4)

## 2015-05-01 MED ORDER — POTASSIUM CHLORIDE CRYS ER 20 MEQ PO TBCR
40.0000 meq | EXTENDED_RELEASE_TABLET | ORAL | Status: AC
  Administered 2015-05-01 (×3): 40 meq via ORAL
  Filled 2015-05-01 (×3): qty 2

## 2015-05-01 MED ORDER — FOSFOMYCIN TROMETHAMINE 3 G PO PACK
3.0000 g | PACK | Freq: Once | ORAL | Status: AC
  Administered 2015-05-01: 3 g via ORAL
  Filled 2015-05-01: qty 3

## 2015-05-01 NOTE — Progress Notes (Signed)
RT placed patient on CPAP. Patient home setting is 19 cmH2O. Steril water added to water chamber for humidification. RT will continue to monitor.
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
## 2015-05-02 ENCOUNTER — Encounter (HOSPITAL_COMMUNITY): Payer: Self-pay | Admitting: Plastic Surgery

## 2015-05-02 ENCOUNTER — Encounter: Payer: Self-pay | Admitting: Hematology

## 2015-05-02 LAB — CBC
HCT: 28.3 % — ABNORMAL LOW (ref 39.0–52.0)
HEMOGLOBIN: 8.8 g/dL — AB (ref 13.0–17.0)
MCH: 25.6 pg — AB (ref 26.0–34.0)
MCHC: 31.1 g/dL (ref 30.0–36.0)
MCV: 82.3 fL (ref 78.0–100.0)
Platelets: 501 10*3/uL — ABNORMAL HIGH (ref 150–400)
RBC: 3.44 MIL/uL — AB (ref 4.22–5.81)
RDW: 17.6 % — ABNORMAL HIGH (ref 11.5–15.5)
WBC: 12.3 10*3/uL — ABNORMAL HIGH (ref 4.0–10.5)

## 2015-05-02 LAB — BASIC METABOLIC PANEL
ANION GAP: 7 (ref 5–15)
BUN: 12 mg/dL (ref 6–20)
CHLORIDE: 110 mmol/L (ref 101–111)
CO2: 27 mmol/L (ref 22–32)
Calcium: 8.6 mg/dL — ABNORMAL LOW (ref 8.9–10.3)
Creatinine, Ser: 0.35 mg/dL — ABNORMAL LOW (ref 0.61–1.24)
GFR calc non Af Amer: >60 mL/min (ref >60)
GLUCOSE: 114 mg/dL — AB (ref 65–99)
Potassium: 4.4 mmol/L (ref 3.5–5.1)
Sodium: 144 mmol/L (ref 135–145)

## 2015-05-02 LAB — CULTURE, BLOOD (ROUTINE X 2): Culture: NO GROWTH

## 2015-05-02 MED ORDER — MAGNESIUM SULFATE 2 GM/50ML IV SOLN
2.0000 g | Freq: Once | INTRAVENOUS | Status: AC
  Administered 2015-05-02: 2 g via INTRAVENOUS
  Filled 2015-05-02: qty 50
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
## 2015-05-02 NOTE — Progress Notes (Signed)
RT placed patient on CPAP. Sterile water added to water chamber for humidification. RT will continue to monitor.

## 2015-05-02 NOTE — Progress Notes (Signed)
Pt wants CPAP at around midnight, RT to monitor and assess as needed.  

## 2015-05-03 LAB — BASIC METABOLIC PANEL
Anion gap: 6 (ref 5–15)
BUN: 12 mg/dL (ref 6–20)
CHLORIDE: 111 mmol/L (ref 101–111)
CO2: 27 mmol/L (ref 22–32)
Calcium: 8.6 mg/dL — ABNORMAL LOW (ref 8.9–10.3)
Creatinine, Ser: 0.43 mg/dL — ABNORMAL LOW (ref 0.61–1.24)
GFR calc Af Amer: >60 mL/min (ref >60)
GLUCOSE: 157 mg/dL — AB (ref 65–99)
POTASSIUM: 3.5 mmol/L (ref 3.5–5.1)
Sodium: 144 mmol/L (ref 135–145)

## 2015-05-03 LAB — CULTURE, BLOOD (ROUTINE X 2): Culture: NO GROWTH

## 2015-05-03 LAB — MAGNESIUM: Magnesium: 1.6 mg/dL — ABNORMAL LOW (ref 1.7–2.4)

## 2015-05-03 MED ORDER — MAGNESIUM OXIDE 400 (241.3 MG) MG PO TABS
400.0000 mg | ORAL_TABLET | Freq: Every day | ORAL | Status: DC
  Administered 2015-05-03 – 2015-05-05 (×3): 400 mg via ORAL
  Filled 2015-05-03 (×3): qty 1
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
## 2015-05-03 NOTE — Progress Notes (Signed)
Pharmacy Antibiotic Follow-up Note  [name] [name] is a 49 y.o. year-old male admitted on 04/27/2015.  The patient is currently on day 7 of Zosyn for septic shock due to UTI and presumed flare from chronic osteomyelitis.  Assessment/Plan: Received 3 days of vanc and fosfomycin x1 to complete treatment of enterococcus UTI.   SCr <1(quadpalegic), stable renal fxn, CrCl >20 ml/min, temperature low, WBC sl elevated, plan for possible I/D this week.    Continue Zosyn 3.375g IV q8h (infuse over 4 hours).   Temp (24hrs), Avg:97.5 F (36.4 C), Min:96.6 F (35.9 C), Max:98.3 F (36.8 C)   Recent Labs Lab 04/27/15 1657 04/29/15 0540 04/30/15 0448 05/01/15 0537 05/02/15 0510  WBC 17.5* 9.7 12.5* 12.2* 12.3*     Recent Labs Lab 04/29/15 0540 04/30/15 0448 05/01/15 0537 05/02/15 0510 05/03/15 0553  CREATININE 0.38* 0.48* 0.45* 0.35* 0.43*   Estimated Creatinine Clearance: 139.3 mL/min (by C-G formula based on Cr of 0.43).    Allergies  Allergen Reactions  . Ditropan [Oxybutynin] Other (See Comments)    hallucinations    Antimicrobials this admission: Zosyn 12/12 >>  Vanc 12/12 >> 12/15  Microbiology results: 12/12 BCx: NGF 12/12 urine:  1) 50k Proteus: resistant only to nitro 2) >100k Providencia: sens to CTX, imipenem, Zosyn  3) 50k Pseudomonas: resistant to Primaxin and intermediate to Cipro 4) > 100k entercoccus: sensitive only to vanc 12/13 MRSA PCR: neg  Thank you for allowing pharmacy to be a part of this patient's care.  Ralene Bathe, PharmD, BCPS 05/03/2015, 1:15 PM  Pager: [phone]

## 2015-05-03 NOTE — Progress Notes (Signed)
RT at bedside to place CPAP but RN wants me to hold off until Pt gets bath and dressing change.  Pt in agreement, RN to call when finished.  RT to monitor and assess as needed.

## 2015-05-04 ENCOUNTER — Encounter (HOSPITAL_BASED_OUTPATIENT_CLINIC_OR_DEPARTMENT_OTHER): Payer: Medicare Other

## 2015-05-04 NOTE — Care Management Note (Signed)
Case Management Note  Patient Details  Name: [name] [name] MRN: [id number] Date of Birth: 07/11/1965  Subjective/Objective:     Septic shock d/t UTI               Action/Plan: Pt was active with [facility] for IV abx. Will continue to follow for dc planning.   Expected Discharge Date:   (unknown)               Expected Discharge Plan:  [place]  In-House Referral:  NA  Discharge planning Services  CM Consult  Post Acute Care Choice:  Home Health, Resumption of Svcs/PTA Provider Choice offered to:  Patient  DME Arranged:    DME Agency:     HH Arranged:  RN [place] Agency:  [place]  Status of Service:  In process, will continue to follow  Medicare Important Message Given:  Yes Date Medicare IM Given:    Medicare IM give by:    Date Additional Medicare IM Given:    Additional Medicare Important Message give by:     If discussed at [place] of Stay Meetings, dates discussed:    Additional Comments:  Erenest Rasher, RN 05/04/2015, 12:41 PM

## 2015-05-04 NOTE — Care Management Important Message (Signed)
Important Message  Patient Deta IM Letter given to [name]/Case Manager to present to Patient Name: [name] [name] MRN: [id number] Date of Birth: Sep 29, 1965   Medicare Important Message Given:  Yes    [name] 05/04/2015, 11:29 AM
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
## 2015-05-05 DIAGNOSIS — I959 Hypotension, unspecified: Secondary | ICD-10-CM | POA: Insufficient documentation

## 2015-05-05 DIAGNOSIS — N390 Urinary tract infection, site not specified: Secondary | ICD-10-CM | POA: Insufficient documentation

## 2015-05-05 MED ORDER — CEFPODOXIME PROXETIL 200 MG PO TABS: 200.0000 mg | ORAL_TABLET | Freq: Two times a day (BID) | ORAL | Status: DC

## 2015-05-05 MED ORDER — SUCRALFATE 1 G PO TABS: 1.0000 g | ORAL_TABLET | Freq: Four times a day (QID) | ORAL | Status: DC

## 2015-05-05 MED ORDER — MAGNESIUM OXIDE 400 (241.3 MG) MG PO TABS: 400.0000 mg | ORAL_TABLET | Freq: Every day | ORAL | Status: DC
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
## 2015-05-05 NOTE — Progress Notes (Signed)
Per [facility] rep pt was receiving HHRN/PT/CSW prior to admission. [facility] HHRN was doing wound care per patient. Resumption of care orders received from MD. [facility] rep alerted of discharge today. No other CM needs communicated. Marney Doctor RN,BSN,NCM [phone]

## 2015-05-05 NOTE — Progress Notes (Signed)
RT placed patient on CPAP. Sterile water added to water chamber for humidification. Patient is tolerating well. RT will continue to monitor. 

## 2015-05-12 ENCOUNTER — Encounter (HOSPITAL_COMMUNITY): Payer: Self-pay | Admitting: Emergency Medicine

## 2015-05-12 ENCOUNTER — Inpatient Hospital Stay (HOSPITAL_COMMUNITY)
Admission: EM | Admit: 2015-05-12 | Discharge: 2015-05-19 | DRG: 871 | Disposition: A | Discharge status: Home Health | Payer: Medicare Other | Attending: Internal Medicine | Admitting: Internal Medicine

## 2015-05-12 ENCOUNTER — Emergency Department (HOSPITAL_COMMUNITY): Payer: Medicare Other

## 2015-05-12 DIAGNOSIS — N390 Urinary tract infection, site not specified: Secondary | ICD-10-CM | POA: Diagnosis present

## 2015-05-12 DIAGNOSIS — A419 Sepsis, unspecified organism: Secondary | ICD-10-CM | POA: Diagnosis present

## 2015-05-12 DIAGNOSIS — L89103 Pressure ulcer of unspecified part of back, stage 3: Secondary | ICD-10-CM | POA: Diagnosis present

## 2015-05-12 DIAGNOSIS — D509 Iron deficiency anemia, unspecified: Secondary | ICD-10-CM | POA: Diagnosis present

## 2015-05-12 DIAGNOSIS — L89303 Pressure ulcer of unspecified buttock, stage 3: Secondary | ICD-10-CM | POA: Diagnosis present

## 2015-05-12 DIAGNOSIS — G8250 Quadriplegia, unspecified: Secondary | ICD-10-CM | POA: Diagnosis present

## 2015-05-12 DIAGNOSIS — Z9989 Dependence on other enabling machines and devices: Secondary | ICD-10-CM

## 2015-05-12 DIAGNOSIS — Z9359 Other cystostomy status: Secondary | ICD-10-CM

## 2015-05-12 DIAGNOSIS — D649 Anemia, unspecified: Secondary | ICD-10-CM | POA: Diagnosis present

## 2015-05-12 DIAGNOSIS — Z933 Colostomy status: Secondary | ICD-10-CM

## 2015-05-12 DIAGNOSIS — G4733 Obstructive sleep apnea (adult) (pediatric): Secondary | ICD-10-CM | POA: Diagnosis present

## 2015-05-12 DIAGNOSIS — A4152 Sepsis due to Pseudomonas: Principal | ICD-10-CM | POA: Diagnosis present

## 2015-05-12 DIAGNOSIS — R32 Unspecified urinary incontinence: Secondary | ICD-10-CM | POA: Diagnosis present

## 2015-05-12 DIAGNOSIS — Z833 Family history of diabetes mellitus: Secondary | ICD-10-CM

## 2015-05-12 DIAGNOSIS — D638 Anemia in other chronic diseases classified elsewhere: Secondary | ICD-10-CM | POA: Diagnosis present

## 2015-05-12 DIAGNOSIS — Z87440 Personal history of urinary (tract) infections: Secondary | ICD-10-CM | POA: Diagnosis not present

## 2015-05-12 DIAGNOSIS — I10 Essential (primary) hypertension: Secondary | ICD-10-CM | POA: Diagnosis present

## 2015-05-12 DIAGNOSIS — L89159 Pressure ulcer of sacral region, unspecified stage: Secondary | ICD-10-CM | POA: Diagnosis present

## 2015-05-12 DIAGNOSIS — E876 Hypokalemia: Secondary | ICD-10-CM | POA: Diagnosis present

## 2015-05-12 DIAGNOSIS — D72829 Elevated white blood cell count, unspecified: Secondary | ICD-10-CM | POA: Diagnosis present

## 2015-05-12 DIAGNOSIS — I959 Hypotension, unspecified: Secondary | ICD-10-CM | POA: Diagnosis present

## 2015-05-12 DIAGNOSIS — Z8619 Personal history of other infectious and parasitic diseases: Secondary | ICD-10-CM | POA: Diagnosis present

## 2015-05-12 LAB — COMPREHENSIVE METABOLIC PANEL
ALK PHOS: 80 U/L (ref 38–126)
ALT: 12 U/L — AB (ref 17–63)
AST: 19 U/L (ref 15–41)
Albumin: 2.5 g/dL — ABNORMAL LOW (ref 3.5–5.0)
Anion gap: 9 (ref 5–15)
BILIRUBIN TOTAL: 0.4 mg/dL (ref 0.3–1.2)
BUN: 23 mg/dL — AB (ref 6–20)
CALCIUM: 9 mg/dL (ref 8.9–10.3)
CO2: 29 mmol/L (ref 22–32)
CREATININE: 0.56 mg/dL — AB (ref 0.61–1.24)
Chloride: 104 mmol/L (ref 101–111)
GFR calc Af Amer: >60 mL/min (ref >60)
Glucose, Bld: 121 mg/dL — ABNORMAL HIGH (ref 65–99)
Potassium: 3.4 mmol/L — ABNORMAL LOW (ref 3.5–5.1)
Sodium: 142 mmol/L (ref 135–145)
TOTAL PROTEIN: 7.2 g/dL (ref 6.5–8.1)

## 2015-05-12 LAB — URINALYSIS, ROUTINE W REFLEX MICROSCOPIC
Bilirubin Urine: NEGATIVE
GLUCOSE, UA: NEGATIVE mg/dL
Ketones, ur: NEGATIVE mg/dL
Nitrite: NEGATIVE
PROTEIN: 30 mg/dL — AB
Specific Gravity, Urine: 1.013 (ref 1.005–1.030)
pH: 6.5 (ref 5.0–8.0)

## 2015-05-12 LAB — CBC WITH DIFFERENTIAL/PLATELET
BASOS ABS: 0 10*3/uL (ref 0.0–0.1)
Basophils Relative: 0 %
Eosinophils Absolute: 0.3 10*3/uL (ref 0.0–0.7)
Eosinophils Relative: 2 %
HEMATOCRIT: 30.5 % — AB (ref 39.0–52.0)
Hemoglobin: 9.3 g/dL — ABNORMAL LOW (ref 13.0–17.0)
LYMPHS ABS: 1 10*3/uL (ref 0.7–4.0)
LYMPHS PCT: 7 %
MCH: 24.4 pg — ABNORMAL LOW (ref 26.0–34.0)
MCHC: 30.5 g/dL (ref 30.0–36.0)
MCV: 80.1 fL (ref 78.0–100.0)
Monocytes Absolute: 2.1 10*3/uL — ABNORMAL HIGH (ref 0.1–1.0)
Monocytes Relative: 15 %
NEUTROS ABS: 10.9 10*3/uL — AB (ref 1.7–7.7)
Neutrophils Relative %: 76 %
Platelets: 511 10*3/uL — ABNORMAL HIGH (ref 150–400)
RBC: 3.81 MIL/uL — AB (ref 4.22–5.81)
RDW: 17.9 % — ABNORMAL HIGH (ref 11.5–15.5)
WBC: 14.3 10*3/uL — AB (ref 4.0–10.5)

## 2015-05-12 LAB — URINE MICROSCOPIC-ADD ON

## 2015-05-12 LAB — MAGNESIUM: Magnesium: 1.8 mg/dL (ref 1.7–2.4)

## 2015-05-12 LAB — I-STAT CG4 LACTIC ACID, ED: LACTIC ACID, VENOUS: 1.07 mmol/L (ref 0.5–2.0)

## 2015-05-12 MED ORDER — PIPERACILLIN-TAZOBACTAM 3.375 G IVPB 30 MIN
3.3750 g | Freq: Once | INTRAVENOUS | Status: AC
Start: 2015-05-12 — End: 2015-05-12
  Administered 2015-05-12: 3.375 g via INTRAVENOUS
  Filled 2015-05-12: qty 50

## 2015-05-12 MED ORDER — SODIUM CHLORIDE 0.9 % IV BOLUS (SEPSIS)
1000.0000 mL | Freq: Once | INTRAVENOUS | Status: AC
  Administered 2015-05-12: 1000 mL via INTRAVENOUS

## 2015-05-12 MED ORDER — VANCOMYCIN HCL IN DEXTROSE 1-5 GM/200ML-% IV SOLN
1000.0000 mg | Freq: Once | INTRAVENOUS | Status: AC
  Administered 2015-05-13: 1000 mg via INTRAVENOUS
  Filled 2015-05-12 (×2): qty 200

## 2015-05-12 MED ORDER — SODIUM CHLORIDE 0.9 % IV SOLN
1000.0000 mL | INTRAVENOUS | Status: DC
  Administered 2015-05-12: 1000 mL via INTRAVENOUS
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
## 2015-05-12 NOTE — ED Notes (Signed)
RN called floor 4 time to give report; floor picked up but no one spoke; on 4th call, line was busy; will try to call again in 10 minutes

## 2015-05-12 NOTE — ED Notes (Signed)
Bed: WA22 Expected date:  Expected time:  Means of arrival:  Comments: EMS 
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
## 2015-05-12 NOTE — ED Notes (Signed)
Pt currently having a procedure done. Blood draw delayed.

## 2015-05-12 NOTE — ED Notes (Signed)
Only 1 blood culture was able to be collected; pt was stuck several times with no success so antibiotics were started to prevent delay in care

## 2015-05-12 NOTE — ED Notes (Signed)
Pt from home, paraplegic since 1988, reports suprapubic catheter dislodged. Per ems clean and dry site. Alert and oriented x 4.

## 2015-05-13 ENCOUNTER — Encounter (HOSPITAL_COMMUNITY): Payer: Self-pay | Admitting: *Deleted

## 2015-05-13 DIAGNOSIS — E876 Hypokalemia: Secondary | ICD-10-CM

## 2015-05-13 DIAGNOSIS — G4733 Obstructive sleep apnea (adult) (pediatric): Secondary | ICD-10-CM

## 2015-05-13 DIAGNOSIS — I959 Hypotension, unspecified: Secondary | ICD-10-CM

## 2015-05-13 DIAGNOSIS — I10 Essential (primary) hypertension: Secondary | ICD-10-CM

## 2015-05-13 DIAGNOSIS — N390 Urinary tract infection, site not specified: Secondary | ICD-10-CM

## 2015-05-13 DIAGNOSIS — Z9359 Other cystostomy status: Secondary | ICD-10-CM

## 2015-05-13 DIAGNOSIS — A419 Sepsis, unspecified organism: Secondary | ICD-10-CM

## 2015-05-13 DIAGNOSIS — L89154 Pressure ulcer of sacral region, stage 4: Secondary | ICD-10-CM

## 2015-05-13 DIAGNOSIS — G8250 Quadriplegia, unspecified: Secondary | ICD-10-CM

## 2015-05-13 LAB — COMPREHENSIVE METABOLIC PANEL
ALT: 12 U/L — AB (ref 17–63)
AST: 23 U/L (ref 15–41)
Albumin: 2.3 g/dL — ABNORMAL LOW (ref 3.5–5.0)
Alkaline Phosphatase: 70 U/L (ref 38–126)
Anion gap: 11 (ref 5–15)
BILIRUBIN TOTAL: 0.4 mg/dL (ref 0.3–1.2)
BUN: 21 mg/dL — ABNORMAL HIGH (ref 6–20)
CALCIUM: 8.5 mg/dL — AB (ref 8.9–10.3)
CHLORIDE: 105 mmol/L (ref 101–111)
CO2: 24 mmol/L (ref 22–32)
CREATININE: 0.55 mg/dL — AB (ref 0.61–1.24)
Glucose, Bld: 142 mg/dL — ABNORMAL HIGH (ref 65–99)
Potassium: 2.8 mmol/L — ABNORMAL LOW (ref 3.5–5.1)
Sodium: 140 mmol/L (ref 135–145)
TOTAL PROTEIN: 6.6 g/dL (ref 6.5–8.1)

## 2015-05-13 LAB — CBC WITH DIFFERENTIAL/PLATELET
Basophils Absolute: 0 10*3/uL (ref 0.0–0.1)
Basophils Relative: 0 %
EOS PCT: 3 %
Eosinophils Absolute: 0.3 10*3/uL (ref 0.0–0.7)
HEMATOCRIT: 29.3 % — AB (ref 39.0–52.0)
Hemoglobin: 8.9 g/dL — ABNORMAL LOW (ref 13.0–17.0)
LYMPHS ABS: 1.8 10*3/uL (ref 0.7–4.0)
LYMPHS PCT: 16 %
MCH: 24.6 pg — AB (ref 26.0–34.0)
MCHC: 30.4 g/dL (ref 30.0–36.0)
MCV: 80.9 fL (ref 78.0–100.0)
MONO ABS: 1.6 10*3/uL — AB (ref 0.1–1.0)
Monocytes Relative: 14 %
NEUTROS ABS: 7.3 10*3/uL (ref 1.7–7.7)
Neutrophils Relative %: 67 %
PLATELETS: 480 10*3/uL — AB (ref 150–400)
RBC: 3.62 MIL/uL — AB (ref 4.22–5.81)
RDW: 18 % — ABNORMAL HIGH (ref 11.5–15.5)
WBC: 11 10*3/uL — AB (ref 4.0–10.5)

## 2015-05-13 LAB — MAGNESIUM: MAGNESIUM: 1.7 mg/dL (ref 1.7–2.4)

## 2015-05-13 MED ORDER — ADULT MULTIVITAMIN W/MINERALS CH
1.0000 | ORAL_TABLET | Freq: Every morning | ORAL | Status: DC
  Administered 2015-05-13 – 2015-05-19 (×7): 1 via ORAL
  Filled 2015-05-13 (×7): qty 1

## 2015-05-13 MED ORDER — FERROUS SULFATE 325 (65 FE) MG PO TABS
325.0000 mg | ORAL_TABLET | Freq: Three times a day (TID) | ORAL | Status: DC
  Administered 2015-05-13 – 2015-05-19 (×21): 325 mg via ORAL
  Filled 2015-05-13 (×21): qty 1

## 2015-05-13 MED ORDER — BOOST PO LIQD
237.0000 mL | Freq: Two times a day (BID) | ORAL | Status: DC
  Administered 2015-05-13 – 2015-05-19 (×12): 237 mL via ORAL
  Filled 2015-05-13 (×13): qty 237

## 2015-05-13 MED ORDER — PIPERACILLIN-TAZOBACTAM 3.375 G IVPB
3.3750 g | Freq: Three times a day (TID) | INTRAVENOUS | Status: DC
  Administered 2015-05-13 – 2015-05-19 (×20): 3.375 g via INTRAVENOUS
  Filled 2015-05-13 (×21): qty 50

## 2015-05-13 MED ORDER — VANCOMYCIN HCL IN DEXTROSE 1-5 GM/200ML-% IV SOLN
1000.0000 mg | Freq: Three times a day (TID) | INTRAVENOUS | Status: DC
  Filled 2015-05-13: qty 200

## 2015-05-13 MED ORDER — DOCUSATE SODIUM 100 MG PO CAPS
200.0000 mg | ORAL_CAPSULE | Freq: Two times a day (BID) | ORAL | Status: DC
  Administered 2015-05-13 – 2015-05-19 (×13): 200 mg via ORAL
  Filled 2015-05-13 (×16): qty 2

## 2015-05-13 MED ORDER — SUCRALFATE 1 G PO TABS
1.0000 g | ORAL_TABLET | Freq: Four times a day (QID) | ORAL | Status: DC
  Administered 2015-05-13 – 2015-05-19 (×27): 1 g via ORAL
  Filled 2015-05-13 (×28): qty 1

## 2015-05-13 MED ORDER — COLLAGENASE 250 UNIT/GM EX OINT
1.0000 "application " | TOPICAL_OINTMENT | Freq: Every day | CUTANEOUS | Status: DC
  Administered 2015-05-13 – 2015-05-18 (×6): 1 via TOPICAL
  Filled 2015-05-13 (×3): qty 30

## 2015-05-13 MED ORDER — BOOST PO LIQD
237.0000 mL | Freq: Three times a day (TID) | ORAL | Status: DC
  Administered 2015-05-13: 237 mL via ORAL
  Filled 2015-05-13 (×2): qty 237

## 2015-05-13 MED ORDER — CHLORHEXIDINE GLUCONATE CLOTH 2 % EX PADS
6.0000 | MEDICATED_PAD | Freq: Every day | CUTANEOUS | Status: DC
  Administered 2015-05-13 – 2015-05-19 (×7): 6 via TOPICAL

## 2015-05-13 MED ORDER — POTASSIUM CHLORIDE CRYS ER 20 MEQ PO TBCR: 40.0000 meq | EXTENDED_RELEASE_TABLET | Freq: Every day | ORAL | Status: DC | PRN

## 2015-05-13 MED ORDER — POTASSIUM CHLORIDE CRYS ER 20 MEQ PO TBCR
40.0000 meq | EXTENDED_RELEASE_TABLET | Freq: Three times a day (TID) | ORAL | Status: AC
  Administered 2015-05-13 (×3): 40 meq via ORAL
  Filled 2015-05-13 (×3): qty 2

## 2015-05-13 MED ORDER — POTASSIUM CHLORIDE IN NACL 20-0.9 MEQ/L-% IV SOLN
INTRAVENOUS | Status: DC
  Administered 2015-05-13 – 2015-05-15 (×6): via INTRAVENOUS
  Filled 2015-05-13 (×10): qty 1000

## 2015-05-13 MED ORDER — VANCOMYCIN HCL IN DEXTROSE 1-5 GM/200ML-% IV SOLN
1000.0000 mg | Freq: Three times a day (TID) | INTRAVENOUS | Status: DC
  Administered 2015-05-13 – 2015-05-14 (×5): 1000 mg via INTRAVENOUS
  Filled 2015-05-13 (×5): qty 200

## 2015-05-13 MED ORDER — DARIFENACIN HYDROBROMIDE ER 15 MG PO TB24
15.0000 mg | ORAL_TABLET | Freq: Every day | ORAL | Status: DC
  Administered 2015-05-13 – 2015-05-19 (×7): 15 mg via ORAL
  Filled 2015-05-13 (×7): qty 1

## 2015-05-13 MED ORDER — POTASSIUM CHLORIDE CRYS ER 10 MEQ PO TBCR
30.0000 meq | EXTENDED_RELEASE_TABLET | Freq: Once | ORAL | Status: AC
  Administered 2015-05-13: 30 meq via ORAL
  Filled 2015-05-13: qty 1

## 2015-05-13 MED ORDER — ONDANSETRON HCL 4 MG PO TABS: 4.0000 mg | ORAL_TABLET | Freq: Four times a day (QID) | ORAL | Status: DC | PRN

## 2015-05-13 MED ORDER — ENSURE ENLIVE PO LIQD
237.0000 mL | ORAL | Status: DC
  Administered 2015-05-13 – 2015-05-19 (×6): 237 mL via ORAL

## 2015-05-13 MED ORDER — METOCLOPRAMIDE HCL 10 MG PO TABS
10.0000 mg | ORAL_TABLET | Freq: Three times a day (TID) | ORAL | Status: DC
  Administered 2015-05-13 – 2015-05-19 (×21): 10 mg via ORAL
  Filled 2015-05-13 (×22): qty 1

## 2015-05-13 MED ORDER — JUVEN PO PACK
1.0000 | PACK | Freq: Two times a day (BID) | ORAL | Status: DC
  Administered 2015-05-13 – 2015-05-18 (×12): 1 via ORAL
  Filled 2015-05-13 (×14): qty 1

## 2015-05-13 MED ORDER — PANTOPRAZOLE SODIUM 40 MG PO TBEC
40.0000 mg | DELAYED_RELEASE_TABLET | Freq: Two times a day (BID) | ORAL | Status: DC
  Administered 2015-05-13 – 2015-05-19 (×14): 40 mg via ORAL
  Filled 2015-05-13 (×15): qty 1

## 2015-05-13 MED ORDER — SODIUM CHLORIDE 0.9 % IJ SOLN
3.0000 mL | Freq: Two times a day (BID) | INTRAMUSCULAR | Status: DC
  Administered 2015-05-13 – 2015-05-17 (×3): 3 mL via INTRAVENOUS

## 2015-05-13 MED ORDER — DOXYCYCLINE HYCLATE 100 MG PO TABS
100.0000 mg | ORAL_TABLET | Freq: Two times a day (BID) | ORAL | Status: DC
  Administered 2015-05-13: 100 mg via ORAL
  Filled 2015-05-13: qty 1

## 2015-05-13 MED ORDER — OXYCODONE HCL 5 MG PO TABS: 10.0000 mg | ORAL_TABLET | Freq: Three times a day (TID) | ORAL | Status: DC | PRN

## 2015-05-13 MED ORDER — ONDANSETRON HCL 4 MG/2ML IJ SOLN: 4.0000 mg | Freq: Four times a day (QID) | INTRAMUSCULAR | Status: DC | PRN

## 2015-05-13 MED ORDER — VITAMIN C 500 MG PO TABS
500.0000 mg | ORAL_TABLET | Freq: Every morning | ORAL | Status: DC
  Administered 2015-05-13 – 2015-05-19 (×7): 500 mg via ORAL
  Filled 2015-05-13 (×7): qty 1

## 2015-05-13 MED ORDER — ENOXAPARIN SODIUM 40 MG/0.4ML ~~LOC~~ SOLN
40.0000 mg | SUBCUTANEOUS | Status: DC
  Administered 2015-05-13 – 2015-05-19 (×7): 40 mg via SUBCUTANEOUS
  Filled 2015-05-13 (×7): qty 0.4

## 2015-05-13 MED ORDER — ZINC SULFATE 220 (50 ZN) MG PO CAPS
220.0000 mg | ORAL_CAPSULE | Freq: Two times a day (BID) | ORAL | Status: DC
  Administered 2015-05-13 – 2015-05-19 (×13): 220 mg via ORAL
  Filled 2015-05-13 (×14): qty 1

## 2015-05-13 MED ORDER — BACLOFEN 20 MG PO TABS
20.0000 mg | ORAL_TABLET | Freq: Four times a day (QID) | ORAL | Status: DC
  Administered 2015-05-13 – 2015-05-19 (×27): 20 mg via ORAL
  Filled 2015-05-13 (×28): qty 1

## 2015-05-13 MED ORDER — MAGNESIUM OXIDE 400 (241.3 MG) MG PO TABS
400.0000 mg | ORAL_TABLET | Freq: Every day | ORAL | Status: DC
  Administered 2015-05-13 – 2015-05-19 (×7): 400 mg via ORAL
  Filled 2015-05-13 (×7): qty 1

## 2015-05-13 NOTE — Care Management Note (Signed)
Case Management Note  Patient Details  Name: [name] [name] MRN: [id number] Date of Birth: 21-Nov-1965  Subjective/Objective:   49 y/o m admitted w/Sepsis. Readmit-12/12-12/20-sepsis. From home.Active w/[facility] HHRN/PT/OT/social Insurance underwriter.Recc-resume HHC if needed.                 Action/Plan:d/c plan home w/HHC.   Expected Discharge Date:                  Expected Discharge Plan:  [place]  In-House Referral:     Discharge planning Services  CM Consult  Post Acute Care Choice:  Home Health (Active w/[facility]-HHRN/PT/OT/MSW) Choice offered to:     DME Arranged:    DME Agency:     HH Arranged:    HH Agency:     Status of Service:  In process, will continue to follow  Medicare Important Message Given:    Date Medicare IM Given:    Medicare IM give by:    Date Additional Medicare IM Given:    Additional Medicare Important Message give by:     If discussed at [place] of Stay Meetings, dates discussed:    Additional Comments:  Dessa Phi, RN 05/13/2015, 12:52 PM

## 2015-05-13 NOTE — Progress Notes (Addendum)
Progress Note   [name] [id number] DOB: 04/06/1966 DOA: 05/12/2015 PCP: [name], MD   Brief Narrative:   [name] [name] is an 49 y.o. male with a PMH of quadriplegia status post suprapubic catheter with frequent UTIs with multi-drug-resistant organisms, status post colostomy, OSA on C Pap, hypertension, decubitus ulcers, and chronic anemia who was admitted 05/12/15 after his catheter became dislodged while being turned. Upon initial assessment, the patient was noted to be tachycardic, febrile and hypotensive.  Assessment/Plan:   Principal Problem:   Sepsis secondary to UTI ([place]) - Given known history of MDR's, patient was admitted and placed on broad-spectrum antibiotics: Zosyn/vancomycin. - Continue IV fluids to maintain blood pressure. - Follow-up blood, urine cultures.  Active Problems:   Quadriplegia ([place]) - Continue supportive care. - Continue baclofen.    Hypotension - Blood pressure soft, continue IV fluids.    Suprapubic catheter ([place]) - Continue Enablex.    OSA on CPAP - Continue C Pap daily at bedtime.    Sacral decubitus ulcer - Continue Santyl.    Hypokalemia - Replete. Check magnesium.    DVT Prophylaxis - Lovenox ordered.   Family Communication/Anticipated D/C date and plan/Code Status   Family Communication: No family at the bedside. Disposition Plan: Home when stable. Has a caregiver. Anticipated D/C date:   2-3 days depending on culture results. Code Status: Full code.   IV Access:    Peripheral IV   Procedures and diagnostic studies:   Dg Chest 2 View  05/12/2015  CLINICAL DATA:  Hypotension EXAM: CHEST - 2 VIEW COMPARISON:  04/27/2015 FINDINGS: Cardiac shadow is mildly enlarged but stable. Chronic changes involving the right fifth rib and scapula are again seen and stable. No focal infiltrate or sizable effusion is noted. IMPRESSION: Chronic changes without acute abnormality. Electronically Signed   By: Inez Catalina  M.D.   On: 05/12/2015 20:49     Medical Consultants:    None.  Anti-Infectives:   Anti-infectives    Start     Dose/Rate Route Frequency Ordered Stop   05/13/15 1000  doxycycline (VIBRA-TABS) tablet 100 mg     100 mg Oral 2 times daily 05/13/15 0049     05/13/15 0800  vancomycin (VANCOCIN) IVPB 1000 mg/200 mL premix     1,000 mg 200 mL/hr over 60 Minutes Intravenous Every 8 hours 05/13/15 0559     05/13/15 0600  piperacillin-tazobactam (ZOSYN) IVPB 3.375 g     3.375 g 12.5 mL/hr over 240 Minutes Intravenous 3 times per day 05/13/15 0557     05/13/15 0600  vancomycin (VANCOCIN) IVPB 1000 mg/200 mL premix  Status:  Discontinued     1,000 mg 200 mL/hr over 60 Minutes Intravenous Every 8 hours 05/13/15 0557 05/13/15 0558   05/12/15 2215  piperacillin-tazobactam (ZOSYN) IVPB 3.375 g     3.375 g 100 mL/hr over 30 Minutes Intravenous  Once 05/12/15 2202 05/12/15 2248   05/12/15 2215  vancomycin (VANCOCIN) IVPB 1000 mg/200 mL premix     1,000 mg 200 mL/hr over 60 Minutes Intravenous  Once 05/12/15 2202 05/13/15 0310      Subjective:   [name] [name] denies pain, dyspnea, says his appetite is good.  Objective:    Filed Vitals:   05/12/15 2100 05/12/15 2341 05/13/15 0229 05/13/15 0639  BP: 102/61 108/70  94/42  Pulse: 97 104  95  Temp:    99.3 F (37.4 C)  TempSrc:    Oral  Resp: 22 28  20  Height:   6' (1.829 m)   Weight:   92 kg (202 lb 13.2 oz)   SpO2: 95% 97%  100%    Intake/Output Summary (Last 24 hours) at 05/13/15 0831 Last data filed at 05/13/15 0656  Gross per 24 hour  Intake 969.17 ml  Output    300 ml  Net 669.17 ml   Filed Weights   05/13/15 0229  Weight: 92 kg (202 lb 13.2 oz)    Exam: Gen:  NAD Cardiovascular: Tachycardic, No M/R/G Respiratory:  Lungs CTAB Gastrointestinal:  Abdomen soft, NT/ND, + BS Extremities:  No C/E/C   Data Reviewed:    Labs: Basic Metabolic Panel:  Recent Labs Lab 05/12/15 1928 05/13/15 0145  NA 142 140  K  3.4* 2.8*  CL 104 105  CO2 29 24  GLUCOSE 121* 142*  BUN 23* 21*  CREATININE 0.56* 0.55*  CALCIUM 9.0 8.5*  MG 1.8  --    GFR Estimated Creatinine Clearance: 123.9 mL/min (by C-G formula based on Cr of 0.55). Liver Function Tests:  Recent Labs Lab 05/12/15 1928 05/13/15 0145  AST 19 23  ALT 12* 12*  ALKPHOS 80 70  BILITOT 0.4 0.4  PROT 7.2 6.6  ALBUMIN 2.5* 2.3*   CBC:  Recent Labs Lab 05/12/15 1928 05/13/15 0145  WBC 14.3* 11.0*  NEUTROABS 10.9* 7.3  HGB 9.3* 8.9*  HCT 30.5* 29.3*  MCV 80.1 80.9  PLT 511* 480*   Sepsis Labs:  Recent Labs Lab 05/12/15 1919 05/12/15 1928 05/13/15 0145  WBC  --  14.3* 11.0*  LATICACIDVEN 1.07  --   --    Microbiology No results found for this or any previous visit (from the past 240 hour(s)).   Medications:   . baclofen  20 mg Oral QID  . collagenase  1 application Topical Daily  . darifenacin  15 mg Oral Daily  . docusate sodium  200 mg Oral BID  . doxycycline  100 mg Oral BID  . enoxaparin (LOVENOX) injection  40 mg Subcutaneous Q24H  . ferrous sulfate  325 mg Oral TID WC  . lactose free nutrition  237 mL Oral TID BM  . magnesium oxide  400 mg Oral Daily  . metoCLOPramide  10 mg Oral TID AC  . multivitamin with minerals  1 tablet Oral q morning - 10a  . nutrition supplement (JUVEN)  1 packet Oral BID BM  . pantoprazole  40 mg Oral BID AC  . piperacillin-tazobactam (ZOSYN)  IV  3.375 g Intravenous 3 times per day  . sodium chloride  3 mL Intravenous Q12H  . sucralfate  1 g Oral QID  . vancomycin  1,000 mg Intravenous Q8H  . vitamin C  500 mg Oral q morning - 10a  . zinc sulfate  220 mg Oral BID   Continuous Infusions: . 0.9 % NaCl with KCl 20 mEq / L 125 mL/hr at 05/13/15 0210    Time spent: 25 minutes.   LOS: 1 day   Rayquon Uselman  [facility] Pager [phone]. If unable to reach me by pager, please call my cell phone at [phone].  *Please refer to [email or web address], password [email or web address] to get updated  schedule on who will round on this patient, as hospitalists switch teams weekly. If 7PM-7AM, please contact night-coverage at [email or web address], password [email or web address] for any overnight needs.  05/13/2015, 8:31 AM
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
## 2015-05-13 NOTE — Progress Notes (Signed)
Pt refused cpap at this time and stated he will wear it tonight.  Pt was advised that RT is available should he change his mind.  RN aware.

## 2015-05-13 NOTE — Progress Notes (Signed)
Patient came up from ED without an IV access. IV antibiotic due at this time has not been started. Waiting for IV team to assess patient for IV stick.

## 2015-05-13 NOTE — Progress Notes (Signed)
Initial Nutrition Assessment  DOCUMENTATION CODES:   Not applicable  INTERVENTION:  - Continue Boost Plus BID, each supplement provides 360 kcal and 14 grams of protein. - Continue Juven BID, each supplement provides 80 kcal and 14 grams of amino acids - Will order Ensure Enlive once/day, this supplement provides 350 kcal and 20 grams of protein. - RD will continue to monitor for needs  NUTRITION DIAGNOSIS:   Increased nutrient needs related to wound healing as evidenced by estimated needs.  GOAL:   Patient will meet greater than or equal to 90% of their needs  MONITOR:   PO intake, Supplement acceptance, Weight trends, Labs, Skin, I & O's  REASON FOR ASSESSMENT:   Malnutrition Screening Tool  ASSESSMENT:   49 y.o. male with a past medical history of quadriplegia, status post suprapubic catheter placement, status post colostomy placement, obstructive sleep apnea on CPAP, hypertension, decubiti ulcers, frequent UTIs, anemia who was brought in via EMS due to having his catheter accidentally dislodged while he was being turned for pressure ulcers care by one of his caregivers.   Pt seen for MST. BMI indicates overweight status. Pt reports that he ate almost 100% of breakfast this AM which consisted of egg white omelet, grits, and fruit. Pt states that he had a good appetite PTA and that his caregiver prepares meals for him as well as feeds him. Pt states that he was consuming Boost and Juven PTA and that he likes this supplement. Pt denies abdominal pain or nausea currently or PTA. He denies any chewing or swallowing difficulties.   Pt reports that he has been losing weight recently with some fluctuation over the past 1 month. He states that his caregiver has been preparing healthier food options and he has also been receiving medication to help with previous issues with impaction. Pt feels that these changes have been contributing to weight loss. Mild fat wasting to upper arm with no  other muscle or fat wasting present. Per chart review, pt gained 22 lbs from 03/16/15 to 05/05/15 and then lost 15 lbs (7% body weight) from 12/20-12/28. This weight loss is significant for time frame; will continue to monitor fluctuations.  Will decrease Boost to BID and trial Ensure Enlive once/day given increased protein in this supplement. Pt likely to meet needs. Medications reviewed. Labs reviewed; K: 3.4 mmol/L, BUN/creatinine elevated but trending down, Ca: 8.5 mg/dL.   Diet Order:  Diet Heart Room service appropriate?: Yes; Fluid consistency:: Thin  Skin:   R heel blister (04/27/15), Unknown location with soft brown eschar, R leg pressure ulcer with no documented stage, Full thickness L heel pressure ulcer, Stage 4 ischial tuberosity pressure ulcer, Unstageable pressure ulcer to R unknown area  Last BM:  12/28  Height:   Ht Readings from Last 1 Encounters:  05/13/15 6' (1.829 m)    Weight:   Wt Readings from Last 1 Encounters:  05/13/15 202 lb 13.2 oz (92 kg)    Ideal Body Weight:  80.91 kg (kg)  BMI:  Body mass index is 27.5 kg/(m^2).  Estimated Nutritional Needs:   Kcal:  2100-2300  Protein:  115-125 grams  Fluid:  2.2 L/day  EDUCATION NEEDS:   No education needs identified at this time     [name] [name], RD, LDN Inpatient Clinical Dietitian Pager # [phone] After hours/weekend pager # [phone]

## 2015-05-13 NOTE — Progress Notes (Signed)
[facility]  Patient Status: Active (receiving services up to time of hospitalization)  [facility] is providing the following services: RN, PT, OT and MSW  If patient discharges after hours, please call [phone].   [name] [name] 05/13/2015, 9:53 AM

## 2015-05-14 LAB — CBC
HCT: 28.7 % — ABNORMAL LOW (ref 39.0–52.0)
Hemoglobin: 8.8 g/dL — ABNORMAL LOW (ref 13.0–17.0)
MCH: 24.9 pg — AB (ref 26.0–34.0)
MCHC: 30.7 g/dL (ref 30.0–36.0)
MCV: 81.1 fL (ref 78.0–100.0)
PLATELETS: 486 10*3/uL — AB (ref 150–400)
RBC: 3.54 MIL/uL — AB (ref 4.22–5.81)
RDW: 18 % — ABNORMAL HIGH (ref 11.5–15.5)
WBC: 8 10*3/uL (ref 4.0–10.5)

## 2015-05-14 LAB — BASIC METABOLIC PANEL
Anion gap: 9 (ref 5–15)
BUN: 18 mg/dL (ref 6–20)
CALCIUM: 8.7 mg/dL — AB (ref 8.9–10.3)
CO2: 24 mmol/L (ref 22–32)
CREATININE: 0.43 mg/dL — AB (ref 0.61–1.24)
Chloride: 107 mmol/L (ref 101–111)
GFR calc Af Amer: >60 mL/min (ref >60)
GLUCOSE: 113 mg/dL — AB (ref 65–99)
POTASSIUM: 4.8 mmol/L (ref 3.5–5.1)
Sodium: 140 mmol/L (ref 135–145)

## 2015-05-14 LAB — VANCOMYCIN, TROUGH: Vancomycin Tr: 22 ug/mL — ABNORMAL HIGH (ref 10.0–20.0)

## 2015-05-14 MED ORDER — VANCOMYCIN HCL IN DEXTROSE 1-5 GM/200ML-% IV SOLN
1000.0000 mg | Freq: Two times a day (BID) | INTRAVENOUS | Status: DC
  Administered 2015-05-15: 1000 mg via INTRAVENOUS
  Filled 2015-05-14 (×2): qty 200

## 2015-05-14 NOTE — Progress Notes (Signed)
Progress Note   [name] [name] [id number] DOB: Apr 26, 1966 DOA: 05/12/2015 PCP: [name], MD   Brief Narrative:   [name] [name] is an 49 y.o. male with a PMH of quadriplegia status post suprapubic catheter with frequent UTIs with multi-drug-resistant organisms, status post colostomy, OSA on C Pap, hypertension, decubitus ulcers, and chronic anemia who was admitted 05/12/15 after his catheter became dislodged while being turned. Upon initial assessment, the patient was noted to be tachycardic, febrile and hypotensive.  Assessment/Plan:   Principal Problem:   Sepsis secondary to UTI ([place]) - Given known history of MDR's, patient was admitted and placed on broad-spectrum antibiotics: Zosyn/vancomycin. - Continue IV fluids to maintain blood pressure. - Follow-up blood, urine cultures.  Active Problems:   Quadriplegia ([place]) - Continue supportive care. - Continue baclofen.    Hypotension - Blood pressure improved.    Suprapubic catheter ([place]) - Continue Enablex.    OSA on CPAP - Continue C Pap daily at bedtime.    Sacral decubitus ulcer - Continue Santyl.    Hypokalemia - Repleted. Magnesium WNL.    DVT Prophylaxis - Lovenox ordered.   Family Communication/Anticipated D/C date and plan/Code Status   Family Communication: No family at the bedside. Disposition Plan: Home when stable. Has a caregiver. Anticipated D/C date:   2 days depending on culture results. Code Status: Full code.   IV Access:    Peripheral IV   Procedures and diagnostic studies:   Dg Chest 2 View  05/12/2015  CLINICAL DATA:  Hypotension EXAM: CHEST - 2 VIEW COMPARISON:  04/27/2015 FINDINGS: Cardiac shadow is mildly enlarged but stable. Chronic changes involving the right fifth rib and scapula are again seen and stable. No focal infiltrate or sizable effusion is noted. IMPRESSION: Chronic changes without acute abnormality. Electronically Signed   By: Inez Catalina M.D.   On:  05/12/2015 20:49     Medical Consultants:    None.  Anti-Infectives:    Doxycycline 05/13/15---> 05/13/15  Zosyn 05/12/15--->  Vancomycin 05/12/15--->   Subjective:   [name] [name] denies pain, dyspnea, says his appetite is good. [name] except for some loose stools in his colostomy bag x 1.  Stools are usually formed.  Objective:    Filed Vitals:   05/13/15 1351 05/13/15 2243 05/14/15 0035 05/14/15 0648  BP: 134/75 121/70  142/82  Pulse: 105   98  Temp: 98.5 F (36.9 C) 99.8 F (37.7 C)  98.8 F (37.1 C)  TempSrc: Oral Oral  Axillary  Resp: 18 18 18 20   Height:      Weight:      SpO2: 100% 99%  100%    Intake/Output Summary (Last 24 hours) at 05/14/15 0811 Last data filed at 05/14/15 0700  Gross per 24 hour  Intake   5075 ml  Output   4052 ml  Net   1023 ml   Filed Weights   05/13/15 0229  Weight: 92 kg (202 lb 13.2 oz)    Exam: Gen:  NAD Cardiovascular: Tachycardic, No M/R/G Respiratory:  Lungs CTAB Gastrointestinal:  Abdomen soft, NT/ND, + BS Extremities:  No C/E/C   Data Reviewed:    Labs: Basic Metabolic Panel:  Recent Labs Lab 05/12/15 1928 05/13/15 0145 05/14/15 0505  NA 142 140 140  K 3.4* 2.8* 4.8  CL 104 105 107  CO2 29 24 24   GLUCOSE 121* 142* 113*  BUN 23* 21* 18  CREATININE 0.56* 0.55* 0.43*  CALCIUM 9.0 8.5* 8.7*  MG 1.8 1.7  --    GFR Estimated Creatinine Clearance: 123.9 mL/min (by C-G formula based on Cr of 0.43). Liver Function Tests:  Recent Labs Lab 05/12/15 1928 05/13/15 0145  AST 19 23  ALT 12* 12*  ALKPHOS 80 70  BILITOT 0.4 0.4  PROT 7.2 6.6  ALBUMIN 2.5* 2.3*   CBC:  Recent Labs Lab 05/12/15 1928 05/13/15 0145 05/14/15 0505  WBC 14.3* 11.0* 8.0  NEUTROABS 10.9* 7.3  --   HGB 9.3* 8.9* 8.8*  HCT 30.5* 29.3* 28.7*  MCV 80.1 80.9 81.1  PLT 511* 480* 486*   Sepsis Labs:  Recent Labs Lab 05/12/15 1919 05/12/15 1928 05/13/15 0145 05/14/15 0505  WBC  --  14.3* 11.0* 8.0    LATICACIDVEN 1.07  --   --   --    Microbiology Recent Results (from the past 240 hour(s))  Blood Culture (routine x 2)     Status: None (Preliminary result)   Collection Time: 05/12/15  8:54 PM  Result Value Ref Range Status   Specimen Description BLOOD RIGHT FINGER  Final   Special Requests IN PEDIATRIC BOTTLE 3CC  Final   Culture   Final    NO GROWTH < 24 HOURS Performed at [facility]    Report Status PENDING  Incomplete     Medications:   . baclofen  20 mg Oral QID  . Chlorhexidine Gluconate Cloth  6 each Topical Q0600  . collagenase  1 application Topical Daily  . darifenacin  15 mg Oral Daily  . docusate sodium  200 mg Oral BID  . enoxaparin (LOVENOX) injection  40 mg Subcutaneous Q24H  . feeding supplement (ENSURE ENLIVE)  237 mL Oral Q24H  . ferrous sulfate  325 mg Oral TID WC  . lactose free nutrition  237 mL Oral BID BM  . magnesium oxide  400 mg Oral Daily  . metoCLOPramide  10 mg Oral TID AC  . multivitamin with minerals  1 tablet Oral q morning - 10a  . nutrition supplement (JUVEN)  1 packet Oral BID BM  . pantoprazole  40 mg Oral BID AC  . piperacillin-tazobactam (ZOSYN)  IV  3.375 g Intravenous 3 times per day  . sodium chloride  3 mL Intravenous Q12H  . sucralfate  1 g Oral QID  . vancomycin  1,000 mg Intravenous Q8H  . vitamin C  500 mg Oral q morning - 10a  . zinc sulfate  220 mg Oral BID   Continuous Infusions: . 0.9 % NaCl with KCl 20 mEq / L 125 mL/hr at 05/14/15 0634    Time spent: 25 minutes.   LOS: 2 days   RAMA,CHRISTINA  [facility] Pager [phone]. If unable to reach me by pager, please call my cell phone at [phone].  *Please refer to [email or web address], password [email or web address] to get updated schedule on who will round on this patient, as hospitalists switch teams weekly. If 7PM-7AM, please contact night-coverage at [email or web address], password [email or web address] for any overnight needs.  05/14/2015, 8:11 AM

## 2015-05-14 NOTE — Progress Notes (Signed)
Pharmacy Antibiotic Follow-up Note  [name] [name] is a 49 y.o. year-old male admitted on 05/12/2015.  The patient is currently on day 2 of Vancomycin and Zosyn for sepsis, UTI.  His PMH includes quadriplegia, suprapubic catheter, colostomy, and frequent MDRO UTI's.  He recently had his catheter dislodged while being turned for chronic sacral pressure ulcers.  Assessment/Plan:  Continue Zosyn 3.375g IV Q8H infused over 4hrs.  Continue Vancomycin 1g IV q8h.  Measure Vanc trough at steady state.  Follow up renal fxn, culture results, and clinical course.   Temp (24hrs), Avg:99 F (37.2 C), Min:98.5 F (36.9 C), Max:99.8 F (37.7 C)   Recent Labs Lab 05/12/15 1928 05/13/15 0145 05/14/15 0505  WBC 14.3* 11.0* 8.0    Recent Labs Lab 05/12/15 1928 05/13/15 0145 05/14/15 0505  CREATININE 0.56* 0.55* 0.43*   Estimated Creatinine Clearance: 123.9 mL/min (by C-G formula based on Cr of 0.43).    Allergies  Allergen Reactions  . Ditropan [Oxybutynin] Other (See Comments)    hallucinations    Antimicrobials this admission: 12/20 (PTA) >> Doxy >> 12/28 12/28 >> Zosyn >> 12/28 >> Vanc >>  Levels/dose changes this admission: 12/29 1700 VT pending  Microbiology results: 12/27 BCx: ngtd 12/28 BCx: ngtd  12/27 UCx: reincubated   Thank you for allowing pharmacy to be a part of this patient's care. Gretta Arab PharmD, BCPS Pager [phone] 05/14/2015 1:36 PM

## 2015-05-14 NOTE — Progress Notes (Signed)
Pharmacy - Brief Note (vancomycin trough follow-up)  Please refer to pharmacist's full note from earlier today for details.  12/29 1700 vanco trough = 22 Mcg/ml on 1gm q8h prior to 6th dose  Assessment/Plan:  Vancomycin trough supratherapeutic on 1gm IV q8h, treating UTI so goal trough is 10-42mcg/ml.  Change to 1gm IV q12h for new estimated trough = 79mcg/ml  Doreene Eland, PharmD, BCPS.   Pager: [phone] 05/14/2015 5:36 PM

## 2015-05-15 LAB — BASIC METABOLIC PANEL
ANION GAP: 8 (ref 5–15)
BUN: 15 mg/dL (ref 6–20)
CHLORIDE: 109 mmol/L (ref 101–111)
CO2: 22 mmol/L (ref 22–32)
Calcium: 8.9 mg/dL (ref 8.9–10.3)
Creatinine, Ser: 0.4 mg/dL — ABNORMAL LOW (ref 0.61–1.24)
GFR calc Af Amer: >60 mL/min (ref >60)
GLUCOSE: 102 mg/dL — AB (ref 65–99)
POTASSIUM: 4.3 mmol/L (ref 3.5–5.1)
Sodium: 139 mmol/L (ref 135–145)

## 2015-05-15 LAB — CBC
HEMATOCRIT: 29.1 % — AB (ref 39.0–52.0)
HEMOGLOBIN: 8.8 g/dL — AB (ref 13.0–17.0)
MCH: 24.7 pg — ABNORMAL LOW (ref 26.0–34.0)
MCHC: 30.2 g/dL (ref 30.0–36.0)
MCV: 81.7 fL (ref 78.0–100.0)
Platelets: 496 10*3/uL — ABNORMAL HIGH (ref 150–400)
RBC: 3.56 MIL/uL — ABNORMAL LOW (ref 4.22–5.81)
RDW: 18 % — AB (ref 11.5–15.5)
WBC: 8.2 10*3/uL (ref 4.0–10.5)

## 2015-05-15 MED ORDER — POLYETHYLENE GLYCOL 3350 17 G PO PACK
17.0000 g | PACK | Freq: Once | ORAL | Status: AC
Start: 2015-05-15 — End: 2015-05-15
  Administered 2015-05-15: 17 g via ORAL
  Filled 2015-05-15: qty 1

## 2015-05-15 NOTE — Progress Notes (Signed)
Progress Note   [name] [name] [id number] DOB: Sep 20, 1965 DOA: 05/12/2015 PCP: [name], MD   Brief Narrative:   [name] [name] is an 49 y.o. male with a PMH of quadriplegia status post suprapubic catheter with frequent UTIs with multi-drug-resistant organisms, status post colostomy, OSA on C Pap, hypertension, decubitus ulcers, and chronic anemia who was admitted 05/12/15 after his catheter became dislodged while being turned. Upon initial assessment, the patient was noted to be tachycardic, febrile and hypotensive.  Assessment/Plan:   Principal Problem:   Sepsis secondary to UTI ([place]) - Given known history of MDR's, patient was admitted and placed on broad-spectrum antibiotics: Zosyn/vancomycin. - Blood pressure much better, discontinue IV fluids. - Follow-up blood, urine cultures. Preliminary urine culture growing Pseudomonas, sensitivities pending. Discontinue vancomycin.  Active Problems:   Quadriplegia ([place]) - Continue supportive care. - Continue baclofen.    Hypotension - Blood pressure improved. KVO IV fluids.    Suprapubic catheter ([place]) - Continue Enablex.    OSA on CPAP - Continue C Pap daily at bedtime.    Sacral decubitus ulcer - Continue Santyl.    Hypokalemia - Repleted. Magnesium WNL.    DVT Prophylaxis - Lovenox ordered.   Family Communication/Anticipated D/C date and plan/Code Status   Family Communication: No family at the bedside. Disposition Plan: Home when stable. Has a caregiver. Anticipated D/C date:   Tomorrow if sensitivities back from urine culture. Code Status: Full code.   IV Access:    Peripheral IV   Procedures and diagnostic studies:   Dg Chest 2 View  05/12/2015  CLINICAL DATA:  Hypotension EXAM: CHEST - 2 VIEW COMPARISON:  04/27/2015 FINDINGS: Cardiac shadow is mildly enlarged but stable. Chronic changes involving the right fifth rib and scapula are again seen and stable. No focal infiltrate or sizable  effusion is noted. IMPRESSION: Chronic changes without acute abnormality. Electronically Signed   By: Inez Catalina M.D.   On: 05/12/2015 20:49     Medical Consultants:    None.  Anti-Infectives:    Doxycycline 05/13/15---> 05/13/15  Zosyn 05/12/15--->  Vancomycin 05/12/15---> 05/15/15   Subjective:   [name] [name] is without complaints today. No further episodes of diarrhea.  Objective:    Filed Vitals:   05/14/15 2244 05/15/15 0200 05/15/15 0658 05/15/15 1314  BP: 115/62  138/81 131/73  Pulse: 109  99 95  Temp: 99.4 F (37.4 C)  98.2 F (36.8 C) 98.3 F (36.8 C)  TempSrc: Oral  Axillary Oral  Resp:  18 18 18   Height:      Weight:      SpO2: 99%  100% 100%    Intake/Output Summary (Last 24 hours) at 05/15/15 1514 Last data filed at 05/15/15 1315  Gross per 24 hour  Intake   4790 ml  Output   3700 ml  Net   1090 ml   Filed Weights   05/13/15 0229  Weight: 92 kg (202 lb 13.2 oz)    Exam: Gen:  NAD Cardiovascular: RRR, No M/R/G Respiratory:  Lungs CTAB Gastrointestinal:  Abdomen soft, NT/ND, + BS Extremities:  No C/E/C   Data Reviewed:    Labs: Basic Metabolic Panel:  Recent Labs Lab 05/12/15 1928 05/13/15 0145 05/14/15 0505 05/15/15 0546  NA 142 140 140 139  K 3.4* 2.8* 4.8 4.3  CL 104 105 107 109  CO2 29 24 24 22   GLUCOSE 121* 142* 113* 102*  BUN 23* 21* 18 15  CREATININE 0.56* 0.55* 0.43* 0.40*  CALCIUM 9.0 8.5* 8.7* 8.9  MG 1.8 1.7  --   --    GFR Estimated Creatinine Clearance: 123.9 mL/min (by C-G formula based on Cr of 0.4). Liver Function Tests:  Recent Labs Lab 05/12/15 1928 05/13/15 0145  AST 19 23  ALT 12* 12*  ALKPHOS 80 70  BILITOT 0.4 0.4  PROT 7.2 6.6  ALBUMIN 2.5* 2.3*   CBC:  Recent Labs Lab 05/12/15 1928 05/13/15 0145 05/14/15 0505 05/15/15 0546  WBC 14.3* 11.0* 8.0 8.2  NEUTROABS 10.9* 7.3  --   --   HGB 9.3* 8.9* 8.8* 8.8*  HCT 30.5* 29.3* 28.7* 29.1*  MCV 80.1 80.9 81.1 81.7  PLT 511* 480*  486* 496*   Sepsis Labs:  Recent Labs Lab 05/12/15 1919 05/12/15 1928 05/13/15 0145 05/14/15 0505 05/15/15 0546  WBC  --  14.3* 11.0* 8.0 8.2  LATICACIDVEN 1.07  --   --   --   --    Microbiology Recent Results (from the past 240 hour(s))  Blood Culture (routine x 2)     Status: None (Preliminary result)   Collection Time: 05/12/15  8:54 PM  Result Value Ref Range Status   Specimen Description BLOOD RIGHT FINGER  Final   Special Requests IN PEDIATRIC BOTTLE 3CC  Final   Culture   Final    NO GROWTH 3 DAYS Performed at [facility]    Report Status PENDING  Incomplete  Urine culture     Status: None (Preliminary result)   Collection Time: 05/12/15  9:18 PM  Result Value Ref Range Status   Specimen Description URINE, SUPRAPUBIC  Final   Special Requests NONE  Final   Culture   Final    >=100,000 COLONIES/mL PSEUDOMONAS AERUGINOSA Performed at [facility]    Report Status PENDING  Incomplete  Blood Culture (routine x 2)     Status: None (Preliminary result)   Collection Time: 05/13/15  1:45 AM  Result Value Ref Range Status   Specimen Description BLOOD RIGHT FINGER  Final   Special Requests IN PEDIATRIC BOTTLE  Final   Culture   Final    NO GROWTH 2 DAYS Performed at [facility]    Report Status PENDING  Incomplete     Medications:   . baclofen  20 mg Oral QID  . Chlorhexidine Gluconate Cloth  6 each Topical Q0600  . collagenase  1 application Topical Daily  . darifenacin  15 mg Oral Daily  . docusate sodium  200 mg Oral BID  . enoxaparin (LOVENOX) injection  40 mg Subcutaneous Q24H  . feeding supplement (ENSURE ENLIVE)  237 mL Oral Q24H  . ferrous sulfate  325 mg Oral TID WC  . lactose free nutrition  237 mL Oral BID BM  . magnesium oxide  400 mg Oral Daily  . metoCLOPramide  10 mg Oral TID AC  . multivitamin with minerals  1 tablet Oral q morning - 10a  . nutrition supplement (JUVEN)  1 packet Oral BID BM  . pantoprazole  40 mg  Oral BID AC  . piperacillin-tazobactam (ZOSYN)  IV  3.375 g Intravenous 3 times per day  . sodium chloride  3 mL Intravenous Q12H  . sucralfate  1 g Oral QID  . vitamin C  500 mg Oral q morning - 10a  . zinc sulfate  220 mg Oral BID   Continuous Infusions: . 0.9 % NaCl with KCl 20 mEq / L 10 mL/hr at 05/15/15 0849  Time spent: 25 minutes.   LOS: 3 days   [facility] Pager [phone]. If unable to reach me by pager, please call my cell phone at [phone].  *Please refer to [email or web address], password [email or web address] to get updated schedule on who will round on this patient, as hospitalists switch teams weekly. If 7PM-7AM, please contact night-coverage at [email or web address], password [email or web address] for any overnight needs.  05/15/2015, 3:14 PM

## 2015-05-15 NOTE — Care Management Important Message (Signed)
Important Message  Patient Details IM Letter given to [name]/Case Manager to present to [place] Message  Patient Details  Name: [name] [name] MRN: [id number] Date of Birth: 07-31-1965   Medicare Important Message Given:  Yes    [name] 05/15/2015, 11:32 AM Name: [name] [name] MRN: [id number] Date of Birth: [birth year]   Medicare Important Message Given:  Yes    [name] 05/15/2015, 11:31 AM

## 2015-05-16 LAB — URINE CULTURE

## 2015-05-16 NOTE — Progress Notes (Signed)
Progress Note   [name] [name] [id number] DOB: 10-08-1965 DOA: 05/12/2015 PCP: [name], MD   Brief Narrative:   [name] [name] is an 49 y.o. male with a PMH of quadriplegia status post suprapubic catheter with frequent UTIs with multi-drug-resistant organisms, status post colostomy, OSA on C Pap, hypertension, decubitus ulcers, and chronic anemia who was admitted 05/12/15 after his catheter became dislodged while being turned. Upon initial assessment, the patient was noted to be tachycardic, febrile and hypotensive.  Assessment/Plan:   Principal Problem:   Sepsis secondary to UTI [facility]) - Given known history of MDR's, patient was admitted and placed on broad-spectrum antibiotics: Zosyn/vancomycin. - Blood pressure improved with fluid volume resuscitation. - Vancomycin discontinued 05/15/15. Urine cultures positive for Pseudomonas. - Unfortunately, oral antibiotics not an option. We'll treat with IV Zosyn for 7 days total.  Active Problems:   Quadriplegia ([place]) - Continue supportive care. - Continue baclofen.    Hypotension - Blood pressure improved. IV fluids now at [facility].    Suprapubic catheter ([place]) - Continue Enablex.    OSA on CPAP - Continue C Pap daily at bedtime.    Sacral decubitus ulcer - Continue Santyl.    Hypokalemia - Repleted. Magnesium WNL.    DVT Prophylaxis - Lovenox ordered.   Family Communication/Anticipated D/C date and plan/Code Status   Family Communication: No family at the bedside. Disposition Plan: Home when stable. Has a caregiver. Anticipated D/C date:   Anticipated 7 day hospital stay, currently day 4. Code Status: Full code.   IV Access:    Peripheral IV   Procedures and diagnostic studies:   Dg Chest 2 View  05/12/2015  CLINICAL DATA:  Hypotension EXAM: CHEST - 2 VIEW COMPARISON:  04/27/2015 FINDINGS: Cardiac shadow is mildly enlarged but stable. Chronic changes involving the right fifth rib and scapula are again  seen and stable. No focal infiltrate or sizable effusion is noted. IMPRESSION: Chronic changes without acute abnormality. Electronically Signed   By: Inez Catalina M.D.   On: 05/12/2015 20:49     Medical Consultants:    None.  Anti-Infectives:    Doxycycline 05/13/15---> 05/13/15  Zosyn 05/12/15--->  Vancomycin 05/12/15---> 05/15/15   Subjective:   [name] [name] is without complaints today. No further episodes of diarrhea. Appetite good.  Objective:    Filed Vitals:   05/15/15 0658 05/15/15 1314 05/15/15 2215 05/16/15 0507  BP: 138/81 131/73 119/52 138/88  Pulse: 99 95 111 87  Temp: 98.2 F (36.8 C) 98.3 F (36.8 C) 98.3 F (36.8 C) 98.8 F (37.1 C)  TempSrc: Axillary Oral Oral Oral  Resp: 18 18 18 24   Height:      Weight:      SpO2: 100% 100% 98% 100%    Intake/Output Summary (Last 24 hours) at 05/16/15 1102 Last data filed at 05/16/15 0511  Gross per 24 hour  Intake 1788.92 ml  Output   4850 ml  Net -3061.08 ml   Filed Weights   05/13/15 0229  Weight: 92 kg (202 lb 13.2 oz)    Exam: Gen:  NAD Cardiovascular: RRR, No M/R/G Respiratory:  Lungs CTAB Gastrointestinal:  Abdomen soft, NT/ND, + BS Extremities:  No C/E/C   Data Reviewed:    Labs: Basic Metabolic Panel:  Recent Labs Lab 05/12/15 1928 05/13/15 0145 05/14/15 0505 05/15/15 0546  NA 142 140 140 139  K 3.4* 2.8* 4.8 4.3  CL 104 105 107 109  CO2 29 24 24 22   GLUCOSE 121*  142* 113* 102*  BUN 23* 21* 18 15  CREATININE 0.56* 0.55* 0.43* 0.40*  CALCIUM 9.0 8.5* 8.7* 8.9  MG 1.8 1.7  --   --    GFR Estimated Creatinine Clearance: 123.9 mL/min (by C-G formula based on Cr of 0.4). Liver Function Tests:  Recent Labs Lab 05/12/15 1928 05/13/15 0145  AST 19 23  ALT 12* 12*  ALKPHOS 80 70  BILITOT 0.4 0.4  PROT 7.2 6.6  ALBUMIN 2.5* 2.3*   CBC:  Recent Labs Lab 05/12/15 1928 05/13/15 0145 05/14/15 0505 05/15/15 0546  WBC 14.3* 11.0* 8.0 8.2  NEUTROABS 10.9* 7.3  --    --   HGB 9.3* 8.9* 8.8* 8.8*  HCT 30.5* 29.3* 28.7* 29.1*  MCV 80.1 80.9 81.1 81.7  PLT 511* 480* 486* 496*   Sepsis Labs:  Recent Labs Lab 05/12/15 1919 05/12/15 1928 05/13/15 0145 05/14/15 0505 05/15/15 0546  WBC  --  14.3* 11.0* 8.0 8.2  LATICACIDVEN 1.07  --   --   --   --    Microbiology Recent Results (from the past 240 hour(s))  Blood Culture (routine x 2)     Status: None (Preliminary result)   Collection Time: 05/12/15  8:54 PM  Result Value Ref Range Status   Specimen Description BLOOD RIGHT FINGER  Final   Special Requests IN PEDIATRIC BOTTLE 3CC  Final   Culture   Final    NO GROWTH 4 DAYS Performed at [name]    Report Status PENDING  Incomplete  Urine culture     Status: None   Collection Time: 05/12/15  9:18 PM  Result Value Ref Range Status   Specimen Description URINE, SUPRAPUBIC  Final   Special Requests NONE  Final   Culture   Final    >=100,000 COLONIES/mL PSEUDOMONAS AERUGINOSA Performed at [facility]    Report Status 05/16/2015 FINAL  Final   Organism ID, Bacteria PSEUDOMONAS AERUGINOSA  Final      Susceptibility   Pseudomonas aeruginosa - MIC*    CEFTAZIDIME 4 SENSITIVE Sensitive     CIPROFLOXACIN 2 INTERMEDIATE Intermediate     GENTAMICIN <=1 SENSITIVE Sensitive     IMIPENEM >=16 RESISTANT Resistant     PIP/TAZO 16 SENSITIVE Sensitive     * >=100,000 COLONIES/mL PSEUDOMONAS AERUGINOSA  Blood Culture (routine x 2)     Status: None (Preliminary result)   Collection Time: 05/13/15  1:45 AM  Result Value Ref Range Status   Specimen Description BLOOD RIGHT FINGER  Final   Special Requests IN PEDIATRIC BOTTLE  Final   Culture   Final    NO GROWTH 3 DAYS Performed at [facility]    Report Status PENDING  Incomplete     Medications:   . baclofen  20 mg Oral QID  . Chlorhexidine Gluconate Cloth  6 each Topical Q0600  . collagenase  1 application Topical Daily  . darifenacin  15 mg Oral Daily  . docusate  sodium  200 mg Oral BID  . enoxaparin (LOVENOX) injection  40 mg Subcutaneous Q24H  . feeding supplement (ENSURE ENLIVE)  237 mL Oral Q24H  . ferrous sulfate  325 mg Oral TID WC  . lactose free nutrition  237 mL Oral BID BM  . magnesium oxide  400 mg Oral Daily  . metoCLOPramide  10 mg Oral TID AC  . multivitamin with minerals  1 tablet Oral q morning - 10a  . nutrition supplement (JUVEN)  1 packet  Oral BID BM  . pantoprazole  40 mg Oral BID AC  . piperacillin-tazobactam (ZOSYN)  IV  3.375 g Intravenous 3 times per day  . sodium chloride  3 mL Intravenous Q12H  . sucralfate  1 g Oral QID  . vitamin C  500 mg Oral q morning - 10a  . zinc sulfate  220 mg Oral BID   Continuous Infusions: . 0.9 % NaCl with KCl 20 mEq / L 10 mL/hr at 05/15/15 2209    Time spent: 25 minutes.   LOS: 4 days   [facility] Pager [phone]. If unable to reach me by pager, please call my cell phone at [phone].  *Please refer to [email or web address], password [email or web address] to get updated schedule on who will round on this patient, as hospitalists switch teams weekly. If 7PM-7AM, please contact night-coverage at [email or web address], password [email or web address] for any overnight needs.  05/16/2015, 11:02 AM

## 2015-05-17 LAB — CULTURE, BLOOD (ROUTINE X 2): Culture: NO GROWTH

## 2015-05-17 NOTE — Progress Notes (Signed)
Progress Note   [name] [name] [id number] DOB: [birth year] DOA: 05/12/2015 PCP: [name], MD   Brief Narrative:   [name] [name] is an 50 y.o. male with a PMH of quadriplegia status post suprapubic catheter with frequent UTIs with multi-drug-resistant organisms, status post colostomy, OSA on C Pap, hypertension, decubitus ulcers, and chronic anemia who was admitted 05/12/15 after his catheter became dislodged while being turned. Upon initial assessment, the patient was noted to be tachycardic, febrile and hypotensive.  Assessment/Plan:   Principal Problem:   Sepsis secondary to UTI [facility]) - Given known history of MDR's, patient was admitted and placed on broad-spectrum antibiotics: Zosyn/vancomycin. - Blood pressure improved with fluid volume resuscitation. - Vancomycin discontinued 05/15/15. Urine cultures positive for Pseudomonas. - Unfortunately, oral antibiotics not an option. We'll treat with IV Zosyn for 7 days total.  Active Problems:   Quadriplegia ([place]) - Continue supportive care. - Continue baclofen.    Hypotension - Blood pressure improved. IV fluids now at [facility].    Suprapubic catheter ([place]) - Continue Enablex.    OSA on CPAP - Continue C Pap daily at bedtime.    Sacral decubitus ulcer - Continue Santyl.    Hypokalemia - Repleted. Magnesium WNL.    DVT Prophylaxis - Lovenox ordered.   Family Communication/Anticipated D/C date and plan/Code Status   Family Communication: No family at the bedside. Disposition Plan: Home when stable. Has a caregiver. Anticipated D/C date:   Anticipated 7 day hospital stay, currently day 5. Code Status: Full code.   IV Access:    Peripheral IV   Procedures and diagnostic studies:   Dg Chest 2 View  05/12/2015  CLINICAL DATA:  Hypotension EXAM: CHEST - 2 VIEW COMPARISON:  04/27/2015 FINDINGS: Cardiac shadow is mildly enlarged but stable. Chronic changes involving the right fifth rib and scapula are again  seen and stable. No focal infiltrate or sizable effusion is noted. IMPRESSION: Chronic changes without acute abnormality. Electronically Signed   By: Inez Catalina M.D.   On: 05/12/2015 20:49     Medical Consultants:    None.  Anti-Infectives:    Doxycycline 05/13/15---> 05/13/15  Zosyn 05/12/15--->  Vancomycin 05/12/15---> 05/15/15   Subjective:   [name] [name] is without complaints today. No further episodes of diarrhea. Appetite remains good.  Objective:    Filed Vitals:   05/16/15 0507 05/16/15 1444 05/16/15 2222 05/17/15 0514  BP: 138/88 115/70 105/62 118/57  Pulse: 87 100 104 85  Temp: 98.8 F (37.1 C) 98.4 F (36.9 C) 98.5 F (36.9 C) 97.9 F (36.6 C)  TempSrc: Oral Oral Oral Axillary  Resp: 24 18 20 18   Height:      Weight:      SpO2: 100% 100% 99% 100%    Intake/Output Summary (Last 24 hours) at 05/17/15 0840 Last data filed at 05/17/15 0700  Gross per 24 hour  Intake   1480 ml  Output   3350 ml  Net  -1870 ml   Filed Weights   05/13/15 0229  Weight: 92 kg (202 lb 13.2 oz)    Exam: Gen:  NAD Cardiovascular: RRR, No M/R/G Respiratory:  Lungs CTAB Gastrointestinal:  Abdomen soft, NT/ND, + BS Extremities:  2-3+ edema, Prevlon boots on   Data Reviewed:    Labs: Basic Metabolic Panel:  Recent Labs Lab 05/12/15 1928 05/13/15 0145 05/14/15 0505 05/15/15 0546  NA 142 140 140 139  K 3.4* 2.8* 4.8 4.3  CL 104 105 107 109  CO2  29 24 24 22   GLUCOSE 121* 142* 113* 102*  BUN 23* 21* 18 15  CREATININE 0.56* 0.55* 0.43* 0.40*  CALCIUM 9.0 8.5* 8.7* 8.9  MG 1.8 1.7  --   --    GFR Estimated Creatinine Clearance: 123.9 mL/min (by C-G formula based on Cr of 0.4). Liver Function Tests:  Recent Labs Lab 05/12/15 1928 05/13/15 0145  AST 19 23  ALT 12* 12*  ALKPHOS 80 70  BILITOT 0.4 0.4  PROT 7.2 6.6  ALBUMIN 2.5* 2.3*   CBC:  Recent Labs Lab 05/12/15 1928 05/13/15 0145 05/14/15 0505 05/15/15 0546  WBC 14.3* 11.0* 8.0 8.2   NEUTROABS 10.9* 7.3  --   --   HGB 9.3* 8.9* 8.8* 8.8*  HCT 30.5* 29.3* 28.7* 29.1*  MCV 80.1 80.9 81.1 81.7  PLT 511* 480* 486* 496*   Sepsis Labs:  Recent Labs Lab 05/12/15 1919 05/12/15 1928 05/13/15 0145 05/14/15 0505 05/15/15 0546  WBC  --  14.3* 11.0* 8.0 8.2  LATICACIDVEN 1.07  --   --   --   --    Microbiology Recent Results (from the past 240 hour(s))  Blood Culture (routine x 2)     Status: None (Preliminary result)   Collection Time: 05/12/15  8:54 PM  Result Value Ref Range Status   Specimen Description BLOOD RIGHT FINGER  Final   Special Requests IN PEDIATRIC BOTTLE 3CC  Final   Culture   Final    NO GROWTH 4 DAYS Performed at [facility]    Report Status PENDING  Incomplete  Urine culture     Status: None   Collection Time: 05/12/15  9:18 PM  Result Value Ref Range Status   Specimen Description URINE, SUPRAPUBIC  Final   Special Requests NONE  Final   Culture   Final    >=100,000 COLONIES/mL PSEUDOMONAS AERUGINOSA Performed at [facility]    Report Status 05/16/2015 FINAL  Final   Organism ID, Bacteria PSEUDOMONAS AERUGINOSA  Final      Susceptibility   Pseudomonas aeruginosa - MIC*    CEFTAZIDIME 4 SENSITIVE Sensitive     CIPROFLOXACIN 2 INTERMEDIATE Intermediate     GENTAMICIN <=1 SENSITIVE Sensitive     IMIPENEM >=16 RESISTANT Resistant     PIP/TAZO 16 SENSITIVE Sensitive     * >=100,000 COLONIES/mL PSEUDOMONAS AERUGINOSA  Blood Culture (routine x 2)     Status: None (Preliminary result)   Collection Time: 05/13/15  1:45 AM  Result Value Ref Range Status   Specimen Description BLOOD RIGHT FINGER  Final   Special Requests IN PEDIATRIC BOTTLE  Final   Culture   Final    NO GROWTH 3 DAYS Performed at [facility]    Report Status PENDING  Incomplete     Medications:   . baclofen  20 mg Oral QID  . Chlorhexidine Gluconate Cloth  6 each Topical Q0600  . collagenase  1 application Topical Daily  . darifenacin  15  mg Oral Daily  . docusate sodium  200 mg Oral BID  . enoxaparin (LOVENOX) injection  40 mg Subcutaneous Q24H  . feeding supplement (ENSURE ENLIVE)  237 mL Oral Q24H  . ferrous sulfate  325 mg Oral TID WC  . lactose free nutrition  237 mL Oral BID BM  . magnesium oxide  400 mg Oral Daily  . metoCLOPramide  10 mg Oral TID AC  . multivitamin with minerals  1 tablet Oral q morning - 10a  .  nutrition supplement (JUVEN)  1 packet Oral BID BM  . pantoprazole  40 mg Oral BID AC  . piperacillin-tazobactam (ZOSYN)  IV  3.375 g Intravenous 3 times per day  . sodium chloride  3 mL Intravenous Q12H  . sucralfate  1 g Oral QID  . vitamin C  500 mg Oral q morning - 10a  . zinc sulfate  220 mg Oral BID   Continuous Infusions: . 0.9 % NaCl with KCl 20 mEq / L 10 mL/hr at 05/15/15 2209    Time spent: 25 minutes.   LOS: 5 days   [facility] Pager [phone]. If unable to reach me by pager, please call my cell phone at [phone].  *Please refer to [email or web address], password [email or web address] to get updated schedule on who will round on this patient, as hospitalists switch teams weekly. If 7PM-7AM, please contact night-coverage at [email or web address], password [email or web address] for any overnight needs.  05/17/2015, 8:40 AM

## 2015-05-17 NOTE — Progress Notes (Signed)
Pharmacy Antibiotic Follow-up Note  [name] [name] is a 50 y.o. year-old male admitted on 05/12/2015.  The patient is currently on day #5 of 7 of Zosyn for sepsis 2/2 Pseudomonas UTI.  His PMH includes quadriplegia, suprapubic catheter, colostomy, and frequent MDRO UTI's.  He recently had his catheter dislodged while being turned for chronic sacral pressure ulcers.  Assessment/Plan: Continue Zosyn 3.375g IV Q8H infused over 4hrs.  Will place stop date of 05/19/15 for 7 day treatment as indicated in MD notes.  SCr has been stable so do not anticipate further dose adjustments.  Pharmacy will sign off at this time.   Temp (24hrs), Avg:98.3 F (36.8 C), Min:97.9 F (36.6 C), Max:98.5 F (36.9 C)   Recent Labs Lab 05/12/15 1928 05/13/15 0145 05/14/15 0505 05/15/15 0546  WBC 14.3* 11.0* 8.0 8.2     Recent Labs Lab 05/12/15 1928 05/13/15 0145 05/14/15 0505 05/15/15 0546  CREATININE 0.56* 0.55* 0.43* 0.40*   Estimated Creatinine Clearance: 123.9 mL/min (by C-G formula based on Cr of 0.4).    Allergies  Allergen Reactions  . Ditropan [Oxybutynin] Other (See Comments)    hallucinations    Antimicrobials this admission: 12/20 (PTA) >> Doxy >> 12/28 12/28 >> Zosyn >> (1/3) 12/28 >> Vanc >> 12/30  Levels/dose changes this admission: 12/29 1700 VT = 22 on 1g q8h (before 6th dose), dose changed 1gm q12h  (goal 10-15 for UTI)  Microbiology results: 12/27 BCx: ngtd 12/28 BCx: ngtd  12/27 UCx: >100k Pseudomonas (S= ceftaz, zosyn, gent; R= primaxin; I= cipro)  Thank you for allowing pharmacy to be a part of this patient's care.  Hershal Coria, PharmD, BCPS Pager: [phone] 05/17/2015 1:25 PM

## 2015-05-18 DIAGNOSIS — D638 Anemia in other chronic diseases classified elsewhere: Secondary | ICD-10-CM

## 2015-05-18 DIAGNOSIS — L89303 Pressure ulcer of unspecified buttock, stage 3: Secondary | ICD-10-CM

## 2015-05-18 DIAGNOSIS — Z8619 Personal history of other infectious and parasitic diseases: Secondary | ICD-10-CM

## 2015-05-18 DIAGNOSIS — L89103 Pressure ulcer of unspecified part of back, stage 3: Secondary | ICD-10-CM

## 2015-05-18 DIAGNOSIS — A4152 Sepsis due to Pseudomonas: Principal | ICD-10-CM

## 2015-05-18 LAB — CULTURE, BLOOD (ROUTINE X 2): CULTURE: NO GROWTH

## 2015-05-18 NOTE — Progress Notes (Signed)
Progress Note   [name] [name] [id number] DOB: 12-28-1965 DOA: 05/12/2015 PCP: [name], MD   Brief Narrative:   [name] [name] is an 50 y.o. male with a PMH of quadriplegia status post suprapubic catheter with frequent UTIs with multi-drug-resistant organisms, status post colostomy, OSA on C Pap, hypertension, decubitus ulcers, and chronic anemia who was admitted 05/12/15 after his catheter became dislodged while being turned. Upon initial assessment, the patient was noted to be tachycardic, febrile and hypotensive.  Assessment/Plan:   Principal Problem:   Sepsis secondary to Pseudomonas UTI ([place]) / Complicated UTI - Given known history of MDR's, patient was admitted and placed on broad-spectrum antibiotics: Zosyn/vancomycin. - Blood pressure improved with fluid volume resuscitation. - Vancomycin discontinued 05/15/15. Urine cultures positive for Pseudomonas. - Unfortunately, oral antibiotics not an option. We'll treat with IV Zosyn for 7 days total.  Active Problems:   Anemia of chronic disease and iron deficiency - Hemoglobin stable.    Quadriplegia ([place]) - Continue supportive care. - Continue baclofen.    Hypotension - Blood pressure improved. IV fluids now at [facility].    Suprapubic catheter ([place]) - Continue Enablex.    OSA on CPAP - Continue C Pap daily at bedtime.    Decubitus ulcer of ischium, sacrum back and lower extremities - Continue Santyl & wound care per wound care RN recommendations. - Provided with a pressure reduction mattress & protectors to LEs.    Hypokalemia - Repleted. Magnesium WNL.    DVT Prophylaxis - Lovenox ordered.   Family Communication/Anticipated D/C date and plan/Code Status   Family Communication: No family at the bedside. Disposition Plan: Home after antibiotic course completed. Has a caregiver. Anticipated D/C date:   Anticipated 7 day hospital stay, currently day 6. Code Status: Full code.   IV Access:     Peripheral IV   Procedures and diagnostic studies:   Dg Chest 2 View  05/12/2015  CLINICAL DATA:  Hypotension EXAM: CHEST - 2 VIEW COMPARISON:  04/27/2015 FINDINGS: Cardiac shadow is mildly enlarged but stable. Chronic changes involving the right fifth rib and scapula are again seen and stable. No focal infiltrate or sizable effusion is noted. IMPRESSION: Chronic changes without acute abnormality. Electronically Signed   By: Inez Catalina M.D.   On: 05/12/2015 20:49     Medical Consultants:    None.  Anti-Infectives:    Doxycycline 05/13/15---> 05/13/15  Zosyn 05/12/15--->  Vancomycin 05/12/15---> 05/15/15   Subjective:   [name] remains without complaints. No further episodes of diarrhea. Appetite remains good.  No pain, or N/V.  Objective:    Filed Vitals:   05/17/15 0514 05/17/15 1333 05/17/15 2103 05/18/15 0558  BP: 118/57 134/73 117/63 92/62  Pulse: 85 90 101 94  Temp: 97.9 F (36.6 C) 98.5 F (36.9 C) 98.4 F (36.9 C) 98.8 F (37.1 C)  TempSrc: Axillary Oral Oral Oral  Resp: 18 20 18 16   Height:      Weight:      SpO2: 100% 99% 100% 100%    Intake/Output Summary (Last 24 hours) at 05/18/15 0729 Last data filed at 05/18/15 0700  Gross per 24 hour  Intake   1830 ml  Output   2500 ml  Net   -670 ml   Filed Weights   05/13/15 0229  Weight: 92 kg (202 lb 13.2 oz)    Exam: Gen:  NAD Cardiovascular: RRR, No M/R/G Respiratory:  Lungs CTAB Gastrointestinal:  Abdomen soft, NT/ND, + BS Extremities:  2-3+ edema, Prevlon boots on   Data Reviewed:    Labs: Basic Metabolic Panel:  Recent Labs Lab 05/12/15 1928 05/13/15 0145 05/14/15 0505 05/15/15 0546  NA 142 140 140 139  K 3.4* 2.8* 4.8 4.3  CL 104 105 107 109  CO2 29 24 24 22   GLUCOSE 121* 142* 113* 102*  BUN 23* 21* 18 15  CREATININE 0.56* 0.55* 0.43* 0.40*  CALCIUM 9.0 8.5* 8.7* 8.9  MG 1.8 1.7  --   --    GFR Estimated Creatinine Clearance: 123.9 mL/min (by C-G formula  based on Cr of 0.4). Liver Function Tests:  Recent Labs Lab 05/12/15 1928 05/13/15 0145  AST 19 23  ALT 12* 12*  ALKPHOS 80 70  BILITOT 0.4 0.4  PROT 7.2 6.6  ALBUMIN 2.5* 2.3*   CBC:  Recent Labs Lab 05/12/15 1928 05/13/15 0145 05/14/15 0505 05/15/15 0546  WBC 14.3* 11.0* 8.0 8.2  NEUTROABS 10.9* 7.3  --   --   HGB 9.3* 8.9* 8.8* 8.8*  HCT 30.5* 29.3* 28.7* 29.1*  MCV 80.1 80.9 81.1 81.7  PLT 511* 480* 486* 496*   Sepsis Labs:  Recent Labs Lab 05/12/15 1919 05/12/15 1928 05/13/15 0145 05/14/15 0505 05/15/15 0546  WBC  --  14.3* 11.0* 8.0 8.2  LATICACIDVEN 1.07  --   --   --   --    Microbiology Recent Results (from the past 240 hour(s))  Blood Culture (routine x 2)     Status: None   Collection Time: 05/12/15  8:54 PM  Result Value Ref Range Status   Specimen Description BLOOD RIGHT FINGER  Final   Special Requests IN PEDIATRIC BOTTLE 3CC  Final   Culture   Final    NO GROWTH 5 DAYS Performed at [facility]    Report Status 05/17/2015 FINAL  Final  Urine culture     Status: None   Collection Time: 05/12/15  9:18 PM  Result Value Ref Range Status   Specimen Description URINE, SUPRAPUBIC  Final   Special Requests NONE  Final   Culture   Final    >=100,000 COLONIES/mL PSEUDOMONAS AERUGINOSA Performed at [facility]    Report Status 05/16/2015 FINAL  Final   Organism ID, Bacteria PSEUDOMONAS AERUGINOSA  Final      Susceptibility   Pseudomonas aeruginosa - MIC*    CEFTAZIDIME 4 SENSITIVE Sensitive     CIPROFLOXACIN 2 INTERMEDIATE Intermediate     GENTAMICIN <=1 SENSITIVE Sensitive     IMIPENEM >=16 RESISTANT Resistant     PIP/TAZO 16 SENSITIVE Sensitive     * >=100,000 COLONIES/mL PSEUDOMONAS AERUGINOSA  Blood Culture (routine x 2)     Status: None (Preliminary result)   Collection Time: 05/13/15  1:45 AM  Result Value Ref Range Status   Specimen Description BLOOD RIGHT FINGER  Final   Special Requests IN PEDIATRIC BOTTLE  Final    Culture   Final    NO GROWTH 4 DAYS Performed at [facility]    Report Status PENDING  Incomplete     Medications:   . baclofen  20 mg Oral QID  . Chlorhexidine Gluconate Cloth  6 each Topical Q0600  . collagenase  1 application Topical Daily  . darifenacin  15 mg Oral Daily  . docusate sodium  200 mg Oral BID  . enoxaparin (LOVENOX) injection  40 mg Subcutaneous Q24H  . feeding supplement (ENSURE ENLIVE)  237 mL Oral Q24H  . ferrous sulfate  325  mg Oral TID WC  . lactose free nutrition  237 mL Oral BID BM  . magnesium oxide  400 mg Oral Daily  . metoCLOPramide  10 mg Oral TID AC  . multivitamin with minerals  1 tablet Oral q morning - 10a  . nutrition supplement (JUVEN)  1 packet Oral BID BM  . pantoprazole  40 mg Oral BID AC  . piperacillin-tazobactam (ZOSYN)  IV  3.375 g Intravenous 3 times per day  . sodium chloride  3 mL Intravenous Q12H  . sucralfate  1 g Oral QID  . vitamin C  500 mg Oral q morning - 10a  . zinc sulfate  220 mg Oral BID   Continuous Infusions: . 0.9 % NaCl with KCl 20 mEq / L 10 mL/hr at 05/15/15 2209    Time spent: 25 minutes.   LOS: 6 days   [facility] Pager [phone]. If unable to reach me by pager, please call my cell phone at [phone].  *Please refer to [email or web address], password [email or web address] to get updated schedule on who will round on this patient, as hospitalists switch teams weekly. If 7PM-7AM, please contact night-coverage at [email or web address], password [email or web address] for any overnight needs.  05/18/2015, 7:29 AM

## 2015-05-18 NOTE — Progress Notes (Signed)
RT placed patient on CPAP. Patient home setting is 19 cmH2O. Sterile water added to water chamber for humidification. Patient is tolerating well. RT will continue to monitor.

## 2015-05-19 NOTE — Progress Notes (Signed)
CSW consulted for transportation needs. Patient will need non-emergency ambulance transport home. RN, Hinton Dyer confirmed home address with patient.   [facility] called for transport to pickup at 6:30pm.   No other CSW needs identified - CSW signing off.   [name] [name], [facility] Clinical Social Worker cell #: [phone]

## 2015-05-19 NOTE — Discharge Summary (Signed)
Physician Discharge Summary  [name] [name] [id number] DOB: [birth year] DOA: 05/12/2015  PCP: [name], MD  Admit date: 05/12/2015 Discharge date: 05/19/2015   Recommendations for Outpatient Follow-Up:   1. Home health nursing services resumed.   Discharge Diagnosis:   Principal Problem:    Sepsis secondary to complicated Pseudomonas UTI ([address]) Active Problems:    Quadriplegia ([name]    Leukocytosis    HTN (hypertension)    Suprapubic catheter ([name]    Complicated UTI (urinary tract infection)    OSA on CPAP    Decubitus ulcer of ischium, stage 3 ([name]    Decubitus ulcer of back, stage 3 ([name]    Sacral decubitus ulcer    History of MDR Pseudomonas aeruginosa infection    Anemia of chronic disease    Hypokalemia    Urinary tract infectious disease    Hypotension   Discharge disposition:  Home.    Discharge Condition: Improved.  Diet recommendation: Low sodium, heart healthy.    Wound care: Wound care to pressure injuries at left heel, right 5th metatarsal head, right medial heel, right lateral LE, Right thoracic area, left IT, right IT, sacrum and right groin:  Cleanse with NS, pat gently dry.   Cover areas with NS dampened gauze (use 4x4s to feet and Kerlix roll gauze to buttocks) placing collagenase ointment in a 1/8 inch layer over areas of necrotic tissue (eschar). Top with dry gauze or ABDs and tape or secure with dry Kerlix wrap (feet). Change daily.    History of Present Illness:   [name] [name] is an 50 y.o. male with a PMH of quadriplegia status post suprapubic catheter with frequent UTIs with multi-drug-resistant organisms, status post colostomy, OSA on C Pap, hypertension, decubitus ulcers, and chronic anemia who was admitted 05/12/15 after his catheter became dislodged while being turned. Upon initial assessment, the patient was noted to be tachycardic, febrile and hypotensive.  Hospital Course by Problem:   Principal Problem:   Sepsis secondary to Pseudomonas UTI ([place]) / Complicated UTI - Given known history of MDR's, patient was admitted and placed on broad-spectrum antibiotics: Zosyn/vancomycin. - Blood pressure improved with fluid volume resuscitation. - Vancomycin discontinued 05/15/15. Urine cultures positive for Pseudomonas. - Unfortunately, oral antibiotics were not an option. Completed a course of 7 days of therapy with Zosyn.  Active Problems:  Anemia of chronic disease and iron deficiency - Hemoglobin stable.   Quadriplegia ([place]) - Continue supportive care with a pressure reduction mattress. - Continue baclofen.   Hypotension - Blood pressure improved status post IV fluid resuscitation.   Suprapubic catheter ([place]) - Continue Enablex.   OSA on CPAP - Continue C Pap daily at bedtime.   Decubitus ulcer of ischium, sacrum back and lower extremities - Continue wound care per wound care RN recommendations. - Provided with a pressure reduction mattress & protectors to LEs.   Hypokalemia - Repleted. Magnesium WNL.    Medical Consultants:    None.   Discharge Exam:   Filed Vitals:   05/19/15 0525 05/19/15 1342  BP: 126/67 137/71  Pulse: 94 95  Temp: 98.4 F (36.9 C) 98.3 F (36.8 C)  Resp:  18   Filed Vitals:   05/18/15 2053 05/19/15 0020 05/19/15 0525 05/19/15 1342  BP: 104/54  126/67 137/71  Pulse: 102 110 94 95  Temp: 98 F (36.7 C)  98.4 F (36.9 C) 98.3 F (36.8 C)  TempSrc: Oral  Oral Oral  Resp: 16 18  18   Height:  Weight:      SpO2: 100% 99% 100% 100%    Gen:  NAD Cardiovascular:  RRR, No M/R/G Respiratory: Lungs CTAB Gastrointestinal: Abdomen soft, NT/ND with normal active bowel sounds. Extremities: No C/E/C   The results of significant diagnostics from this hospitalization (including imaging, microbiology, ancillary and laboratory) are listed below for reference.     Procedures and Diagnostic Studies:   Dg Chest 2 View  05/12/2015  CLINICAL  DATA:  Hypotension EXAM: CHEST - 2 VIEW COMPARISON:  04/27/2015 FINDINGS: Cardiac shadow is mildly enlarged but stable. Chronic changes involving the right fifth rib and scapula are again seen and stable. No focal infiltrate or sizable effusion is noted. IMPRESSION: Chronic changes without acute abnormality. Electronically Signed   By: Inez Catalina M.D.   On: 05/12/2015 20:49     Labs:   Basic Metabolic Panel:  Recent Labs Lab 05/12/15 1928 05/13/15 0145 05/14/15 0505 05/15/15 0546  NA 142 140 140 139  K 3.4* 2.8* 4.8 4.3  CL 104 105 107 109  CO2 29 24 24 22   GLUCOSE 121* 142* 113* 102*  BUN 23* 21* 18 15  CREATININE 0.56* 0.55* 0.43* 0.40*  CALCIUM 9.0 8.5* 8.7* 8.9  MG 1.8 1.7  --   --    GFR Estimated Creatinine Clearance: 123.9 mL/min (by C-G formula based on Cr of 0.4). Liver Function Tests:  Recent Labs Lab 05/12/15 1928 05/13/15 0145  AST 19 23  ALT 12* 12*  ALKPHOS 80 70  BILITOT 0.4 0.4  PROT 7.2 6.6  ALBUMIN 2.5* 2.3*   CBC:  Recent Labs Lab 05/12/15 1928 05/13/15 0145 05/14/15 0505 05/15/15 0546  WBC 14.3* 11.0* 8.0 8.2  NEUTROABS 10.9* 7.3  --   --   HGB 9.3* 8.9* 8.8* 8.8*  HCT 30.5* 29.3* 28.7* 29.1*  MCV 80.1 80.9 81.1 81.7  PLT 511* 480* 486* 496*   Microbiology Recent Results (from the past 240 hour(s))  Blood Culture (routine x 2)     Status: None   Collection Time: 05/12/15  8:54 PM  Result Value Ref Range Status   Specimen Description BLOOD RIGHT FINGER  Final   Special Requests IN PEDIATRIC BOTTLE 3CC  Final   Culture   Final    NO GROWTH 5 DAYS Performed at [facility]    Report Status 05/17/2015 FINAL  Final  Urine culture     Status: None   Collection Time: 05/12/15  9:18 PM  Result Value Ref Range Status   Specimen Description URINE, SUPRAPUBIC  Final   Special Requests NONE  Final   Culture   Final    >=100,000 COLONIES/mL PSEUDOMONAS AERUGINOSA Performed at [facility]    Report Status 05/16/2015  FINAL  Final   Organism ID, Bacteria PSEUDOMONAS AERUGINOSA  Final      Susceptibility   Pseudomonas aeruginosa - MIC*    CEFTAZIDIME 4 SENSITIVE Sensitive     CIPROFLOXACIN 2 INTERMEDIATE Intermediate     GENTAMICIN <=1 SENSITIVE Sensitive     IMIPENEM >=16 RESISTANT Resistant     PIP/TAZO 16 SENSITIVE Sensitive     * >=100,000 COLONIES/mL PSEUDOMONAS AERUGINOSA  Blood Culture (routine x 2)     Status: None   Collection Time: 05/13/15  1:45 AM  Result Value Ref Range Status   Specimen Description BLOOD RIGHT FINGER  Final   Special Requests IN PEDIATRIC BOTTLE  Final   Culture   Final    NO GROWTH 5 DAYS Performed at  [facility]    Report Status 05/18/2015 FINAL  Final     Discharge Instructions:   Discharge Instructions    Call MD for:  extreme fatigue    Complete by:  As directed      Call MD for:  severe uncontrolled pain    Complete by:  As directed      Call MD for:  temperature >100.4    Complete by:  As directed      Diet - low sodium heart healthy    Complete by:  As directed      Face-to-face encounter (required for Medicare/Medicaid patients)    Complete by:  As directed   I Illana Nolting certify that this patient is under my care and that I, or a nurse practitioner or physician's assistant working with me, had a face-to-face encounter that meets the physician face-to-face encounter requirements with this patient on 05/19/2015. The encounter with the patient was in whole, or in part for the following medical condition(s) which is the primary reason for home health care (List medical condition): Paraplegia.  Needs complex wound care, assessment, assistance with ADLs. Please monitor BP and provide a electronic BP meter if possible. Silvadene dressings daily. Wound care to pressure injuries at left heel, right 5th metatarsal head, right medial heel, right lateral LE, Right thoracic area, left IT, right IT, sacrum and right groin:  Cleanse with NS, pat gently dry.    Cover areas with NS dampened gauze (use 4x4s to feet and Kerlix roll gauze to buttocks) placing collagenase ointment in a 1/8 inch layer over areas of necrotic tissue (eschar). Top with dry gauze or ABDs and tape or secure with dry.  The encounter with the patient was in whole, or in part, for the following medical condition, which is the primary reason for home health care:  Paraplegia, sepsis, complicated UTI  I certify that, based on my findings, the following services are medically necessary home health services:   Nursing Physical therapy    Reason for Medically Necessary Home Health Services:   Skilled Nursing- Skilled Assessment/Observation Skilled Nursing- Complex Wound Care Therapy- Home Adaptation to Facilitate Safety Therapy- Therapeutic Exercises to Increase Strength and Endurance    My clinical findings support the need for the above services:  Unable to leave home safely without assistance and/or assistive device  Further, I certify that my clinical findings support that this patient is homebound due to:  Unable to leave home safely without assistance     Home Health    Complete by:  As directed   To provide the following care/treatments:   PT OT [place] work       Increase activity slowly    Complete by:  As directed             Medication List    STOP taking these medications        cefpodoxime 200 MG tablet  Commonly known as:  VANTIN     doxycycline 100 MG tablet  Commonly known as:  VIBRA-TABS      TAKE these medications        baclofen 20 MG tablet  Commonly known as:  LIORESAL  Take 20 mg by mouth 4 (four) times daily.     collagenase ointment  Commonly known as:  SANTYL  Apply 1 application topically daily.     docusate sodium 100 MG capsule  Commonly known as:  COLACE  Take 2 capsules (200 mg  total) by mouth 2 (two) times daily.     ferrous sulfate 325 (65 FE) MG EC tablet  Take 1 tablet (325 mg total) by mouth 3 (three)  times daily with meals.     furosemide 20 MG tablet  Commonly known as:  LASIX  Take 20 mg by mouth daily. Take with Klor-Con     magnesium oxide 400 (241.3 Mg) MG tablet  Commonly known as:  MAG-OX  Take 1 tablet (400 mg total) by mouth daily.     metoCLOPramide 10 MG tablet  Commonly known as:  REGLAN  Take 1 tablet (10 mg total) by mouth 3 (three) times daily before meals.     multivitamin with minerals Tabs tablet  Take 1 tablet by mouth every morning.     lactose free nutrition Liqd  Take 237 mLs by mouth 3 (three) times daily between meals.     nutrition supplement (JUVEN) Pack  Take 1 packet by mouth 2 (two) times daily between meals.     ondansetron 4 MG disintegrating tablet  Commonly known as:  ZOFRAN ODT  Take 1 tablet (4 mg total) by mouth every 8 (eight) hours as needed for nausea.     Oxycodone HCl 10 MG Tabs  Take 10 mg by mouth every 8 (eight) hours as needed (pain).     pantoprazole 40 MG tablet  Commonly known as:  PROTONIX  Take 1 tablet (40 mg total) by mouth 2 (two) times daily before a meal.     potassium chloride SA 20 MEQ tablet  Commonly known as:  K-DUR,KLOR-CON  Take 2 tablets (40 mEq total) by mouth daily as needed (take with lasix for fluid).     sucralfate 1 g tablet  Commonly known as:  CARAFATE  Take 1 tablet (1 g total) by mouth 4 (four) times daily.     VESICARE 10 MG tablet  Generic drug:  solifenacin  Take 10 mg by mouth daily.     vitamin C 500 MG tablet  Commonly known as:  ASCORBIC ACID  Take 500 mg by mouth every morning.     Zinc 50 MG Tabs  Take 50 mg by mouth 2 (two) times daily.           Follow-up Information    Follow up with [name], MD. Schedule an appointment as soon as possible for a visit in 3 weeks.   Specialty:  Internal Medicine   Why:  Hospital follow up.   Contact information:   [address] [place] [address] [phone]       Follow up with [place].   Why:  HHRN/PT/OT/aide/sw   Contact information:   [address] [place] [address] [phone]        Time coordinating discharge: 35 minutes.  Signed:  Demaree Liberto  Pager [phone] [facility] 05/19/2015, 2:33 PM

## 2015-05-19 NOTE — Progress Notes (Signed)
RT filled patient's CPAP with sterile water and placed patient on cpap. Patient is resting comfortably.

## 2015-05-19 NOTE — Care Management Important Message (Signed)
Important Message  Patient Details IM Letter given to [name]/Case Manager to present to [place] Message  Patient Details  Name: [name] [name] MRN: [id number] Date of Birth: Sep 26, 1965   Medicare Important Message Given:  Yes    [name] 05/19/2015, 2:22 PM Name: [name] [name] MRN: [id number] Date of Birth: 01-02-1966   Medicare Important Message Given:  Yes    [name] 05/19/2015, 2:22 PM

## 2015-05-19 NOTE — Care Management Note (Signed)
Case Management Note  Patient Details  Name: [name] [name] MRN: [id number] Date of Birth: 07-21-1965  Subjective/Objective:[facility] aware of d/c & HHC orders.Ambulance transp-CSW managing.Will receive [place] services-[facility] aware.                    Action/Plan:d/c home w/HHC.   Expected Discharge Date:                  Expected Discharge Plan:  [place]  In-House Referral:     Discharge planning Services  CM Consult  Post Acute Care Choice:  [place] (Active w/[facility]-HHRN/PT/OT/MSW) Choice offered to:     DME Arranged:    DME Agency:     HH Arranged:  RN, PT, OT, Nurse's Aide, Social Work [employer] Agency:  [place]  Status of Service:  Completed, signed off  Medicare Important Message Given:  Yes Date Medicare IM Given:    Medicare IM give by:    Date Additional Medicare IM Given:    Additional Medicare Important Message give by:     If discussed at [place] of Stay Meetings, dates discussed:    Additional Comments:  Dessa Phi, RN 05/19/2015, 1:09 PM

## 2015-05-25 ENCOUNTER — Encounter (HOSPITAL_BASED_OUTPATIENT_CLINIC_OR_DEPARTMENT_OTHER): Payer: Medicare Other | Attending: Internal Medicine

## 2015-05-28 ENCOUNTER — Inpatient Hospital Stay (HOSPITAL_COMMUNITY)
Admission: EM | Admit: 2015-05-28 | Discharge: 2015-06-12 | DRG: 871 | Disposition: A | Discharge status: Home Health | Payer: Medicare Other | Attending: Internal Medicine | Admitting: Internal Medicine

## 2015-05-28 ENCOUNTER — Encounter (HOSPITAL_COMMUNITY): Payer: Self-pay | Admitting: Nurse Practitioner

## 2015-05-28 ENCOUNTER — Emergency Department (HOSPITAL_COMMUNITY): Payer: Medicare Other

## 2015-05-28 DIAGNOSIS — Z933 Colostomy status: Secondary | ICD-10-CM

## 2015-05-28 DIAGNOSIS — G8250 Quadriplegia, unspecified: Secondary | ICD-10-CM | POA: Diagnosis present

## 2015-05-28 DIAGNOSIS — A419 Sepsis, unspecified organism: Secondary | ICD-10-CM | POA: Diagnosis present

## 2015-05-28 DIAGNOSIS — R110 Nausea: Secondary | ICD-10-CM | POA: Diagnosis not present

## 2015-05-28 DIAGNOSIS — A4150 Gram-negative sepsis, unspecified: Secondary | ICD-10-CM | POA: Diagnosis not present

## 2015-05-28 DIAGNOSIS — G4733 Obstructive sleep apnea (adult) (pediatric): Secondary | ICD-10-CM | POA: Diagnosis present

## 2015-05-28 DIAGNOSIS — Z8619 Personal history of other infectious and parasitic diseases: Secondary | ICD-10-CM | POA: Diagnosis present

## 2015-05-28 DIAGNOSIS — Z789 Other specified health status: Secondary | ICD-10-CM

## 2015-05-28 DIAGNOSIS — L89154 Pressure ulcer of sacral region, stage 4: Secondary | ICD-10-CM | POA: Diagnosis present

## 2015-05-28 DIAGNOSIS — L89119 Pressure ulcer of right upper back, unspecified stage: Secondary | ICD-10-CM | POA: Diagnosis present

## 2015-05-28 DIAGNOSIS — Z7401 Bed confinement status: Secondary | ICD-10-CM

## 2015-05-28 DIAGNOSIS — A498 Other bacterial infections of unspecified site: Secondary | ICD-10-CM | POA: Diagnosis present

## 2015-05-28 DIAGNOSIS — N3289 Other specified disorders of bladder: Secondary | ICD-10-CM | POA: Diagnosis not present

## 2015-05-28 DIAGNOSIS — Z8701 Personal history of pneumonia (recurrent): Secondary | ICD-10-CM

## 2015-05-28 DIAGNOSIS — E876 Hypokalemia: Secondary | ICD-10-CM | POA: Diagnosis not present

## 2015-05-28 DIAGNOSIS — R7881 Bacteremia: Secondary | ICD-10-CM | POA: Diagnosis present

## 2015-05-28 DIAGNOSIS — M86659 Other chronic osteomyelitis, unspecified thigh: Secondary | ICD-10-CM | POA: Diagnosis present

## 2015-05-28 DIAGNOSIS — D638 Anemia in other chronic diseases classified elsewhere: Secondary | ICD-10-CM | POA: Diagnosis present

## 2015-05-28 DIAGNOSIS — R627 Adult failure to thrive: Secondary | ICD-10-CM | POA: Diagnosis present

## 2015-05-28 DIAGNOSIS — Z515 Encounter for palliative care: Secondary | ICD-10-CM

## 2015-05-28 DIAGNOSIS — J9610 Chronic respiratory failure, unspecified whether with hypoxia or hypercapnia: Secondary | ICD-10-CM | POA: Diagnosis present

## 2015-05-28 DIAGNOSIS — Z9989 Dependence on other enabling machines and devices: Secondary | ICD-10-CM

## 2015-05-28 DIAGNOSIS — M8638 Chronic multifocal osteomyelitis, other site: Secondary | ICD-10-CM | POA: Diagnosis present

## 2015-05-28 DIAGNOSIS — R6521 Severe sepsis with septic shock: Secondary | ICD-10-CM | POA: Diagnosis present

## 2015-05-28 DIAGNOSIS — L89214 Pressure ulcer of right hip, stage 4: Secondary | ICD-10-CM | POA: Diagnosis present

## 2015-05-28 DIAGNOSIS — D72829 Elevated white blood cell count, unspecified: Secondary | ICD-10-CM | POA: Diagnosis present

## 2015-05-28 DIAGNOSIS — Z66 Do not resuscitate: Secondary | ICD-10-CM | POA: Diagnosis present

## 2015-05-28 DIAGNOSIS — N390 Urinary tract infection, site not specified: Secondary | ICD-10-CM | POA: Diagnosis present

## 2015-05-28 DIAGNOSIS — Z87440 Personal history of urinary (tract) infections: Secondary | ICD-10-CM

## 2015-05-28 DIAGNOSIS — L89892 Pressure ulcer of other site, stage 2: Secondary | ICD-10-CM | POA: Diagnosis present

## 2015-05-28 DIAGNOSIS — L89623 Pressure ulcer of left heel, stage 3: Secondary | ICD-10-CM | POA: Diagnosis present

## 2015-05-28 DIAGNOSIS — A408 Other streptococcal sepsis: Secondary | ICD-10-CM

## 2015-05-28 LAB — URINALYSIS, ROUTINE W REFLEX MICROSCOPIC
Glucose, UA: NEGATIVE mg/dL
KETONES UR: NEGATIVE mg/dL
Nitrite: NEGATIVE
PROTEIN: 100 mg/dL — AB
Specific Gravity, Urine: 1.022 (ref 1.005–1.030)
pH: 5 (ref 5.0–8.0)

## 2015-05-28 LAB — COMPREHENSIVE METABOLIC PANEL
ALT: 17 U/L (ref 17–63)
AST: 21 U/L (ref 15–41)
Albumin: 2.6 g/dL — ABNORMAL LOW (ref 3.5–5.0)
Alkaline Phosphatase: 77 U/L (ref 38–126)
Anion gap: 15 (ref 5–15)
BUN: 28 mg/dL — AB (ref 6–20)
CO2: 22 mmol/L (ref 22–32)
Calcium: 8.7 mg/dL — ABNORMAL LOW (ref 8.9–10.3)
Chloride: 102 mmol/L (ref 101–111)
Creatinine, Ser: 0.86 mg/dL (ref 0.61–1.24)
GFR calc Af Amer: >60 mL/min (ref >60)
GFR calc non Af Amer: >60 mL/min (ref >60)
Glucose, Bld: 134 mg/dL — ABNORMAL HIGH (ref 65–99)
POTASSIUM: 3.2 mmol/L — AB (ref 3.5–5.1)
Sodium: 139 mmol/L (ref 135–145)
TOTAL PROTEIN: 7.6 g/dL (ref 6.5–8.1)
Total Bilirubin: 0.5 mg/dL (ref 0.3–1.2)

## 2015-05-28 LAB — CBC WITH DIFFERENTIAL/PLATELET
BASOS ABS: 0 10*3/uL (ref 0.0–0.1)
Basophils Relative: 0 %
Eosinophils Absolute: 0 10*3/uL (ref 0.0–0.7)
Eosinophils Relative: 0 %
HCT: 29.7 % — ABNORMAL LOW (ref 39.0–52.0)
Hemoglobin: 9.3 g/dL — ABNORMAL LOW (ref 13.0–17.0)
LYMPHS ABS: 1.2 10*3/uL (ref 0.7–4.0)
Lymphocytes Relative: 4 %
MCH: 24.4 pg — AB (ref 26.0–34.0)
MCHC: 31.3 g/dL (ref 30.0–36.0)
MCV: 78 fL (ref 78.0–100.0)
MONO ABS: 3 10*3/uL — AB (ref 0.1–1.0)
MONOS PCT: 10 %
Neutro Abs: 25.7 10*3/uL — ABNORMAL HIGH (ref 1.7–7.7)
Neutrophils Relative %: 86 %
PLATELETS: 579 10*3/uL — AB (ref 150–400)
RBC: 3.81 MIL/uL — AB (ref 4.22–5.81)
RDW: 18.1 % — ABNORMAL HIGH (ref 11.5–15.5)
WBC: 29.9 10*3/uL — ABNORMAL HIGH (ref 4.0–10.5)

## 2015-05-28 LAB — LIPASE, BLOOD: LIPASE: 19 U/L (ref 11–51)

## 2015-05-28 LAB — URINE MICROSCOPIC-ADD ON

## 2015-05-28 LAB — I-STAT CG4 LACTIC ACID, ED: Lactic Acid, Venous: 1.46 mmol/L (ref 0.5–2.0)

## 2015-05-28 MED ORDER — SODIUM CHLORIDE 0.9 % IV BOLUS (SEPSIS)
2000.0000 mL | Freq: Once | INTRAVENOUS | Status: AC
  Administered 2015-05-29: 2000 mL via INTRAVENOUS

## 2015-05-28 MED ORDER — SODIUM CHLORIDE 0.9 % IV BOLUS (SEPSIS)
1000.0000 mL | Freq: Once | INTRAVENOUS | Status: AC
  Administered 2015-05-29: 1000 mL via INTRAVENOUS

## 2015-05-28 NOTE — ED Notes (Signed)
Attempted to collect labs, unable, pt is very difficult stick.

## 2015-05-28 NOTE — ED Notes (Signed)
Bed: [id number] Expected date:  Expected time:  Means of arrival:  Comments: EMS- UTI/Septic?/paraplegic

## 2015-05-28 NOTE — ED Notes (Signed)
Pt reports ostomy that is not filling up as much as usual.  Pt also reports Nausea and dry heaves.  Denies vomiting or diarrhea or abd pain.  Pt reports still passing gas though.  States this all started around 12 noon today.

## 2015-05-28 NOTE — ED Notes (Signed)
Patient presents today with complaints of dizziness and nausea as well as feelings of chills.
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
## 2015-05-29 ENCOUNTER — Encounter (HOSPITAL_COMMUNITY): Payer: Self-pay | Admitting: Radiology

## 2015-05-29 ENCOUNTER — Inpatient Hospital Stay (HOSPITAL_COMMUNITY): Payer: Medicare Other

## 2015-05-29 DIAGNOSIS — L89214 Pressure ulcer of right hip, stage 4: Secondary | ICD-10-CM

## 2015-05-29 DIAGNOSIS — Z8701 Personal history of pneumonia (recurrent): Secondary | ICD-10-CM | POA: Diagnosis not present

## 2015-05-29 DIAGNOSIS — N390 Urinary tract infection, site not specified: Secondary | ICD-10-CM | POA: Diagnosis present

## 2015-05-29 DIAGNOSIS — D72829 Elevated white blood cell count, unspecified: Secondary | ICD-10-CM | POA: Diagnosis not present

## 2015-05-29 DIAGNOSIS — R627 Adult failure to thrive: Secondary | ICD-10-CM | POA: Diagnosis present

## 2015-05-29 DIAGNOSIS — L89112 Pressure ulcer of right upper back, stage 2: Secondary | ICD-10-CM

## 2015-05-29 DIAGNOSIS — Z960 Presence of urogenital implants: Secondary | ICD-10-CM | POA: Diagnosis not present

## 2015-05-29 DIAGNOSIS — R6520 Severe sepsis without septic shock: Secondary | ICD-10-CM | POA: Diagnosis not present

## 2015-05-29 DIAGNOSIS — B998 Other infectious disease: Secondary | ICD-10-CM | POA: Diagnosis not present

## 2015-05-29 DIAGNOSIS — R6521 Severe sepsis with septic shock: Secondary | ICD-10-CM | POA: Diagnosis present

## 2015-05-29 DIAGNOSIS — N3289 Other specified disorders of bladder: Secondary | ICD-10-CM | POA: Diagnosis not present

## 2015-05-29 DIAGNOSIS — J9610 Chronic respiratory failure, unspecified whether with hypoxia or hypercapnia: Secondary | ICD-10-CM | POA: Diagnosis present

## 2015-05-29 DIAGNOSIS — G4733 Obstructive sleep apnea (adult) (pediatric): Secondary | ICD-10-CM | POA: Diagnosis present

## 2015-05-29 DIAGNOSIS — L89623 Pressure ulcer of left heel, stage 3: Secondary | ICD-10-CM | POA: Diagnosis present

## 2015-05-29 DIAGNOSIS — D638 Anemia in other chronic diseases classified elsewhere: Secondary | ICD-10-CM | POA: Diagnosis present

## 2015-05-29 DIAGNOSIS — L89154 Pressure ulcer of sacral region, stage 4: Secondary | ICD-10-CM | POA: Diagnosis present

## 2015-05-29 DIAGNOSIS — L89610 Pressure ulcer of right heel, unstageable: Secondary | ICD-10-CM

## 2015-05-29 DIAGNOSIS — B9689 Other specified bacterial agents as the cause of diseases classified elsewhere: Secondary | ICD-10-CM

## 2015-05-29 DIAGNOSIS — M8669 Other chronic osteomyelitis, multiple sites: Secondary | ICD-10-CM | POA: Diagnosis not present

## 2015-05-29 DIAGNOSIS — A419 Sepsis, unspecified organism: Secondary | ICD-10-CM | POA: Diagnosis not present

## 2015-05-29 DIAGNOSIS — Z87440 Personal history of urinary (tract) infections: Secondary | ICD-10-CM | POA: Diagnosis not present

## 2015-05-29 DIAGNOSIS — R7881 Bacteremia: Secondary | ICD-10-CM | POA: Diagnosis not present

## 2015-05-29 DIAGNOSIS — L89890 Pressure ulcer of other site, unstageable: Secondary | ICD-10-CM

## 2015-05-29 DIAGNOSIS — Z7401 Bed confinement status: Secondary | ICD-10-CM | POA: Diagnosis not present

## 2015-05-29 DIAGNOSIS — Z933 Colostomy status: Secondary | ICD-10-CM | POA: Diagnosis not present

## 2015-05-29 DIAGNOSIS — A4150 Gram-negative sepsis, unspecified: Secondary | ICD-10-CM | POA: Diagnosis present

## 2015-05-29 DIAGNOSIS — R110 Nausea: Secondary | ICD-10-CM | POA: Diagnosis present

## 2015-05-29 DIAGNOSIS — L89892 Pressure ulcer of other site, stage 2: Secondary | ICD-10-CM | POA: Diagnosis present

## 2015-05-29 DIAGNOSIS — A4189 Other specified sepsis: Secondary | ICD-10-CM | POA: Diagnosis not present

## 2015-05-29 DIAGNOSIS — G8250 Quadriplegia, unspecified: Secondary | ICD-10-CM | POA: Diagnosis present

## 2015-05-29 DIAGNOSIS — L89899 Pressure ulcer of other site, unspecified stage: Secondary | ICD-10-CM | POA: Diagnosis not present

## 2015-05-29 DIAGNOSIS — Z8619 Personal history of other infectious and parasitic diseases: Secondary | ICD-10-CM | POA: Diagnosis not present

## 2015-05-29 DIAGNOSIS — M8638 Chronic multifocal osteomyelitis, other site: Secondary | ICD-10-CM | POA: Diagnosis present

## 2015-05-29 DIAGNOSIS — E876 Hypokalemia: Secondary | ICD-10-CM | POA: Diagnosis not present

## 2015-05-29 DIAGNOSIS — M86659 Other chronic osteomyelitis, unspecified thigh: Secondary | ICD-10-CM | POA: Diagnosis not present

## 2015-05-29 DIAGNOSIS — L89520 Pressure ulcer of left ankle, unstageable: Secondary | ICD-10-CM

## 2015-05-29 DIAGNOSIS — Z66 Do not resuscitate: Secondary | ICD-10-CM | POA: Diagnosis present

## 2015-05-29 DIAGNOSIS — R509 Fever, unspecified: Secondary | ICD-10-CM | POA: Diagnosis not present

## 2015-05-29 DIAGNOSIS — Z515 Encounter for palliative care: Secondary | ICD-10-CM | POA: Diagnosis not present

## 2015-05-29 LAB — MRSA PCR SCREENING: MRSA by PCR: INVALID — AB

## 2015-05-29 LAB — COMPREHENSIVE METABOLIC PANEL
ALBUMIN: 2.2 g/dL — AB (ref 3.5–5.0)
ALT: 25 U/L (ref 17–63)
AST: 30 U/L (ref 15–41)
Alkaline Phosphatase: 71 U/L (ref 38–126)
Anion gap: 8 (ref 5–15)
BUN: 20 mg/dL (ref 6–20)
CHLORIDE: 109 mmol/L (ref 101–111)
CO2: 23 mmol/L (ref 22–32)
Calcium: 7.8 mg/dL — ABNORMAL LOW (ref 8.9–10.3)
Creatinine, Ser: 0.52 mg/dL — ABNORMAL LOW (ref 0.61–1.24)
GFR calc Af Amer: >60 mL/min (ref >60)
Glucose, Bld: 118 mg/dL — ABNORMAL HIGH (ref 65–99)
POTASSIUM: 3.3 mmol/L — AB (ref 3.5–5.1)
SODIUM: 140 mmol/L (ref 135–145)
Total Bilirubin: 0.8 mg/dL (ref 0.3–1.2)
Total Protein: 6.2 g/dL — ABNORMAL LOW (ref 6.5–8.1)

## 2015-05-29 LAB — BASIC METABOLIC PANEL WITH GFR
Anion gap: 9 (ref 5–15)
BUN: 26 mg/dL — ABNORMAL HIGH (ref 6–20)
CO2: 23 mmol/L (ref 22–32)
Calcium: 8.2 mg/dL — ABNORMAL LOW (ref 8.9–10.3)
Chloride: 111 mmol/L (ref 101–111)
Creatinine, Ser: 0.71 mg/dL (ref 0.61–1.24)
GFR calc Af Amer: >60 mL/min
GFR calc non Af Amer: >60 mL/min
Glucose, Bld: 140 mg/dL — ABNORMAL HIGH (ref 65–99)
Potassium: 3.3 mmol/L — ABNORMAL LOW (ref 3.5–5.1)
Sodium: 143 mmol/L (ref 135–145)

## 2015-05-29 LAB — CBC
HCT: 25.7 % — ABNORMAL LOW (ref 39.0–52.0)
Hemoglobin: 7.8 g/dL — ABNORMAL LOW (ref 13.0–17.0)
MCH: 24.1 pg — ABNORMAL LOW (ref 26.0–34.0)
MCHC: 30.4 g/dL (ref 30.0–36.0)
MCV: 79.3 fL (ref 78.0–100.0)
PLATELETS: 401 10*3/uL — AB (ref 150–400)
RBC: 3.24 MIL/uL — AB (ref 4.22–5.81)
RDW: 18.2 % — AB (ref 11.5–15.5)
WBC: 12.5 10*3/uL — AB (ref 4.0–10.5)

## 2015-05-29 LAB — CORTISOL: CORTISOL PLASMA: 33 ug/dL

## 2015-05-29 LAB — LACTIC ACID, PLASMA
LACTIC ACID, VENOUS: 0.7 mmol/L (ref 0.5–2.0)
LACTIC ACID, VENOUS: 0.8 mmol/L (ref 0.5–2.0)
Lactic Acid, Venous: 0.7 mmol/L (ref 0.5–2.0)

## 2015-05-29 LAB — APTT: aPTT: 41 seconds — ABNORMAL HIGH (ref 24–37)

## 2015-05-29 LAB — PROTIME-INR
INR: 1.35 (ref 0.00–1.49)
INR: 1.44 (ref 0.00–1.49)
PROTHROMBIN TIME: 17.6 s — AB (ref 11.6–15.2)
Prothrombin Time: 16.8 s — ABNORMAL HIGH (ref 11.6–15.2)

## 2015-05-29 LAB — TYPE AND SCREEN
ABO/RH(D): B POS
Antibody Screen: NEGATIVE

## 2015-05-29 LAB — C-REACTIVE PROTEIN: CRP: 21.8 mg/dL — AB (ref <1.0)

## 2015-05-29 LAB — SEDIMENTATION RATE: Sed Rate: 83 mm/hr — ABNORMAL HIGH (ref 0–16)

## 2015-05-29 LAB — C DIFFICILE QUICK SCREEN W PCR REFLEX
C Diff antigen: NEGATIVE
C Diff interpretation: NEGATIVE
C Diff toxin: NEGATIVE

## 2015-05-29 LAB — I-STAT CG4 LACTIC ACID, ED: Lactic Acid, Venous: 0.61 mmol/L (ref 0.5–2.0)

## 2015-05-29 LAB — TROPONIN I: Troponin I: <0.03 ng/mL (ref <0.031)

## 2015-05-29 LAB — PROCALCITONIN: PROCALCITONIN: 1.1 ng/mL

## 2015-05-29 MED ORDER — ADULT MULTIVITAMIN W/MINERALS CH
1.0000 | ORAL_TABLET | Freq: Every morning | ORAL | Status: DC
  Administered 2015-05-30 – 2015-06-12 (×14): 1 via ORAL
  Filled 2015-05-29 (×14): qty 1

## 2015-05-29 MED ORDER — SODIUM CHLORIDE 0.9 % IV BOLUS (SEPSIS)
1000.0000 mL | Freq: Once | INTRAVENOUS | Status: AC
  Administered 2015-05-29: 1000 mL via INTRAVENOUS

## 2015-05-29 MED ORDER — BACLOFEN 20 MG PO TABS
20.0000 mg | ORAL_TABLET | Freq: Four times a day (QID) | ORAL | Status: DC
  Administered 2015-05-30 – 2015-06-12 (×52): 20 mg via ORAL
  Filled 2015-05-29 (×10): qty 1
  Filled 2015-05-29: qty 2
  Filled 2015-05-29 (×6): qty 1
  Filled 2015-05-29: qty 2
  Filled 2015-05-29 (×5): qty 1
  Filled 2015-05-29: qty 2
  Filled 2015-05-29 (×9): qty 1
  Filled 2015-05-29: qty 2
  Filled 2015-05-29 (×19): qty 1
  Filled 2015-05-29: qty 2
  Filled 2015-05-29 (×9): qty 1
  Filled 2015-05-29: qty 2
  Filled 2015-05-29: qty 1

## 2015-05-29 MED ORDER — HYDROCORTISONE NA SUCCINATE PF 100 MG IJ SOLR
100.0000 mg | Freq: Once | INTRAMUSCULAR | Status: AC
  Administered 2015-05-29: 100 mg via INTRAVENOUS
  Filled 2015-05-29: qty 2

## 2015-05-29 MED ORDER — METOCLOPRAMIDE HCL 10 MG PO TABS
10.0000 mg | ORAL_TABLET | Freq: Three times a day (TID) | ORAL | Status: DC
  Administered 2015-05-30 – 2015-06-12 (×40): 10 mg via ORAL
  Filled 2015-05-29 (×50): qty 1

## 2015-05-29 MED ORDER — ZINC SULFATE 220 (50 ZN) MG PO CAPS
220.0000 mg | ORAL_CAPSULE | Freq: Two times a day (BID) | ORAL | Status: DC
  Administered 2015-05-30 – 2015-06-12 (×27): 220 mg via ORAL
  Filled 2015-05-29 (×30): qty 1

## 2015-05-29 MED ORDER — LORAZEPAM 2 MG/ML IJ SOLN
0.5000 mg | INTRAMUSCULAR | Status: DC | PRN
  Administered 2015-05-29: 0.5 mg via INTRAVENOUS
  Filled 2015-05-29: qty 1

## 2015-05-29 MED ORDER — POTASSIUM CHLORIDE CRYS ER 20 MEQ PO TBCR
40.0000 meq | EXTENDED_RELEASE_TABLET | Freq: Once | ORAL | Status: AC
  Administered 2015-05-29: 40 meq via ORAL
  Filled 2015-05-29: qty 2

## 2015-05-29 MED ORDER — LEVOFLOXACIN IN D5W 750 MG/150ML IV SOLN
750.0000 mg | INTRAVENOUS | Status: DC
  Administered 2015-05-29: 750 mg via INTRAVENOUS
  Filled 2015-05-29: qty 150

## 2015-05-29 MED ORDER — FERROUS SULFATE 325 (65 FE) MG PO TABS
325.0000 mg | ORAL_TABLET | Freq: Three times a day (TID) | ORAL | Status: DC
  Administered 2015-05-30 – 2015-06-12 (×40): 325 mg via ORAL
  Filled 2015-05-29 (×43): qty 1

## 2015-05-29 MED ORDER — ONDANSETRON HCL 4 MG/2ML IJ SOLN
4.0000 mg | Freq: Four times a day (QID) | INTRAMUSCULAR | Status: DC | PRN
  Administered 2015-05-29 – 2015-06-06 (×4): 4 mg via INTRAVENOUS
  Filled 2015-05-29 (×4): qty 2

## 2015-05-29 MED ORDER — ACETAMINOPHEN 325 MG PO TABS
650.0000 mg | ORAL_TABLET | Freq: Four times a day (QID) | ORAL | Status: DC | PRN
  Administered 2015-05-29 – 2015-06-08 (×2): 650 mg via ORAL
  Filled 2015-05-29 (×2): qty 2

## 2015-05-29 MED ORDER — OXYCODONE HCL 5 MG PO TABS
10.0000 mg | ORAL_TABLET | Freq: Three times a day (TID) | ORAL | Status: DC | PRN
  Administered 2015-05-30 – 2015-06-06 (×2): 10 mg via ORAL
  Filled 2015-05-29 (×2): qty 2

## 2015-05-29 MED ORDER — DARIFENACIN HYDROBROMIDE ER 15 MG PO TB24
15.0000 mg | ORAL_TABLET | Freq: Every day | ORAL | Status: DC
  Administered 2015-05-30 – 2015-06-12 (×14): 15 mg via ORAL
  Filled 2015-05-29 (×15): qty 1

## 2015-05-29 MED ORDER — DOCUSATE SODIUM 100 MG PO CAPS
200.0000 mg | ORAL_CAPSULE | Freq: Two times a day (BID) | ORAL | Status: DC
  Administered 2015-05-30 – 2015-06-12 (×27): 200 mg via ORAL
  Filled 2015-05-29 (×32): qty 2

## 2015-05-29 MED ORDER — IOHEXOL 300 MG/ML  SOLN
100.0000 mL | Freq: Once | INTRAMUSCULAR | Status: AC | PRN
  Administered 2015-05-29: 100 mL via INTRAVENOUS

## 2015-05-29 MED ORDER — SUCRALFATE 1 G PO TABS
1.0000 g | ORAL_TABLET | Freq: Four times a day (QID) | ORAL | Status: DC
  Administered 2015-05-30 – 2015-06-12 (×52): 1 g via ORAL
  Filled 2015-05-29 (×57): qty 1

## 2015-05-29 MED ORDER — PIPERACILLIN-TAZOBACTAM 3.375 G IVPB
3.3750 g | Freq: Once | INTRAVENOUS | Status: AC
  Administered 2015-05-29: 3.375 g via INTRAVENOUS
  Filled 2015-05-29: qty 50

## 2015-05-29 MED ORDER — ZINC 50 MG PO TABS: 50.0000 mg | ORAL_TABLET | Freq: Two times a day (BID) | ORAL | Status: DC

## 2015-05-29 MED ORDER — VANCOMYCIN HCL IN DEXTROSE 1-5 GM/200ML-% IV SOLN
1000.0000 mg | Freq: Three times a day (TID) | INTRAVENOUS | Status: DC
  Administered 2015-05-29 – 2015-05-30 (×5): 1000 mg via INTRAVENOUS
  Filled 2015-05-29 (×5): qty 200

## 2015-05-29 MED ORDER — VITAMIN C 500 MG PO TABS
500.0000 mg | ORAL_TABLET | Freq: Every morning | ORAL | Status: DC
  Administered 2015-05-30 – 2015-06-12 (×14): 500 mg via ORAL
  Filled 2015-05-29 (×14): qty 1

## 2015-05-29 MED ORDER — SODIUM CHLORIDE 0.9 % IJ SOLN
3.0000 mL | Freq: Two times a day (BID) | INTRAMUSCULAR | Status: DC
  Administered 2015-05-29 – 2015-06-09 (×8): 3 mL via INTRAVENOUS

## 2015-05-29 MED ORDER — PIPERACILLIN-TAZOBACTAM 3.375 G IVPB
3.3750 g | Freq: Three times a day (TID) | INTRAVENOUS | Status: DC
  Filled 2015-05-29: qty 50

## 2015-05-29 MED ORDER — PIPERACILLIN-TAZOBACTAM 3.375 G IVPB
3.3750 g | Freq: Three times a day (TID) | INTRAVENOUS | Status: DC
  Administered 2015-05-29 – 2015-06-12 (×43): 3.375 g via INTRAVENOUS
  Filled 2015-05-29 (×47): qty 50

## 2015-05-29 MED ORDER — LEVOFLOXACIN IN D5W 750 MG/150ML IV SOLN: 750.0000 mg | INTRAVENOUS | Status: DC

## 2015-05-29 MED ORDER — HYDROCORTISONE NA SUCCINATE PF 100 MG IJ SOLR
50.0000 mg | Freq: Four times a day (QID) | INTRAMUSCULAR | Status: DC
  Administered 2015-05-29 – 2015-05-31 (×7): 50 mg via INTRAVENOUS
  Filled 2015-05-29 (×7): qty 2

## 2015-05-29 MED ORDER — SODIUM CHLORIDE 0.9 % IV SOLN
INTRAVENOUS | Status: DC
  Administered 2015-05-29 – 2015-05-31 (×2): via INTRAVENOUS

## 2015-05-29 MED ORDER — COLLAGENASE 250 UNIT/GM EX OINT
TOPICAL_OINTMENT | Freq: Every day | CUTANEOUS | Status: DC
  Administered 2015-05-30 – 2015-06-03 (×4): via TOPICAL
  Administered 2015-06-04 – 2015-06-05 (×2): 1 via TOPICAL
  Administered 2015-06-06 – 2015-06-12 (×7): via TOPICAL
  Filled 2015-05-29 (×8): qty 30

## 2015-05-29 MED ORDER — MAGNESIUM OXIDE 400 (241.3 MG) MG PO TABS
400.0000 mg | ORAL_TABLET | Freq: Every day | ORAL | Status: DC
  Administered 2015-05-30 – 2015-06-12 (×14): 400 mg via ORAL
  Filled 2015-05-29 (×15): qty 1

## 2015-05-29 MED ORDER — MIRABEGRON ER 25 MG PO TB24
25.0000 mg | ORAL_TABLET | Freq: Every day | ORAL | Status: DC
  Administered 2015-05-30 – 2015-06-12 (×14): 25 mg via ORAL
  Filled 2015-05-29 (×15): qty 1

## 2015-05-29 MED ORDER — ENOXAPARIN SODIUM 40 MG/0.4ML ~~LOC~~ SOLN
40.0000 mg | SUBCUTANEOUS | Status: DC
  Administered 2015-05-29 – 2015-06-06 (×9): 40 mg via SUBCUTANEOUS
  Filled 2015-05-29 (×11): qty 0.4

## 2015-05-29 MED ORDER — PANTOPRAZOLE SODIUM 40 MG PO TBEC
40.0000 mg | DELAYED_RELEASE_TABLET | Freq: Two times a day (BID) | ORAL | Status: DC
  Administered 2015-05-30 – 2015-06-12 (×28): 40 mg via ORAL
  Filled 2015-05-29 (×38): qty 1

## 2015-05-29 MED ORDER — PROMETHAZINE HCL 25 MG/ML IJ SOLN
12.5000 mg | Freq: Four times a day (QID) | INTRAMUSCULAR | Status: DC | PRN
  Administered 2015-05-29: 12.5 mg via INTRAVENOUS
  Filled 2015-05-29 (×2): qty 1

## 2015-05-29 MED ORDER — IOHEXOL 300 MG/ML  SOLN
25.0000 mL | Freq: Once | INTRAMUSCULAR | Status: AC | PRN
  Administered 2015-05-29: 50 mL via ORAL

## 2015-05-29 MED ORDER — SODIUM CHLORIDE 0.9 % IV BOLUS (SEPSIS)
1000.0000 mL | Freq: Once | INTRAVENOUS | Status: AC
Start: 2015-05-29 — End: 2015-05-29
  Administered 2015-05-29: 1000 mL via INTRAVENOUS

## 2015-05-29 MED ORDER — ACETAMINOPHEN 650 MG RE SUPP: 650.0000 mg | Freq: Four times a day (QID) | RECTAL | Status: DC | PRN

## 2015-05-29 MED ORDER — SODIUM CHLORIDE 0.9 % IV BOLUS (SEPSIS): 500.0000 mL | Freq: Once | INTRAVENOUS | Status: DC
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
## 2015-05-29 NOTE — Progress Notes (Signed)
Patient feeling nauseated, unrelieved by PRN Zofran. Patient took small sips of water and began to dry heave. Patient refused all PO medications, but was willing to take PRN Tyelonol for his fever. Patient also began to dry heave when being turned and requested to lay supine with his HOB approximately at 45 degrees until his nausea subsides. Will continue to monitor.

## 2015-05-29 NOTE — Procedures (Addendum)
Interventional Radiology Procedure Note  Procedure: Bedside US guided placement of a right IJ approach triple lumen central venous catheter.  CXR pending.   Complications: None Recommendations:  - CXR pending - Do not submerge  - Routine line care   Signed,  Dulcy Fanny. Earleen Newport, DO    Indication: Septic shock Procedure: Sterile, US guided central catheter placement.  Full informed consent was obtained. Patient was identified and a time-out was performed.   Usual sterile technique was used with sterile gown and drape.  US guided technique was used to place a triple lumen central venous catheter into the right IJ vein.  Seldinger technique.  Line was sutured in place with prolene. Sterile dressing applied.  Patient was hemodynamically stable throughout.  No complications.  No significant blood loss.  No specimen.   CXR is pending to confirm the tip of cathter.

## 2015-05-29 NOTE — Consult Note (Signed)
WOC wound consult note Reason for Consult: pressure ulcers Patient well known to the [facility] team from previous admissions.  Wound type: 1. 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  Anatomy for this patient is quite distorted from his disease, long term immobility and previous flap surgery.  This area is 25c mx 18cm (estimate) with a deep area over the right hip that is 6cm  And fluctuant in the base.  3 different areas over this wound are necrotic with yellow slough. Central, at the edge of the deepest part on the right hip, and at the left distal edge of the wound.  2. Stage 3 Pressure injury left heel 3cm x 2cm x 0.2cm; clean, pink, moist  3. Unstageable Pressure injury: left lateral ankle 3.5cm x 1.0cm x 0.2cm; 90% yellow slough  4. Unstageable Pressure injury: Right medial heel 0.5cmx 0.5cm x 0; 100% black small eschar  5. Unstageable Pressure injury: right lateral calf; 0.2cm x 0.2cm x 0.2cm ; 100% yellow base  6. Right lateral trunk; Unstageable Pressure injury; caregiver at the bedside reports that in the bed they currently are using they can not turn him and lift him appropriately and they have to leave the hoyer lift mat under him all the time, this area is exactly where the mat hits his trunk. Not fluctuant but adherent. 7cm x 6cm x 0  Pressure Ulcer POA: Yes Measurement: see above Wound bed: no drainage from the LE ulcers or from the right lateral trunk. However the sacrum and hip/large wound has HEAVY drainage but hard to discern odor because the patient is saturated with urine from leaking from his SP tube.  Drainage (amount, consistency, odor)  Periwound: intact Dressing procedure/placement/frequency: Discussed current POC with patient/caregiver.  We do not have collagen inpatient. Will continue the enzymatic debridement ointment inpatient to the necrotic tissue present  sacral/ischium, left leg, right heel and right flank.   Normal saline otherwise. Add air mattress for moisture management and offloading of severe pressure ulcers and to manage moisture with heavily draining wounds.  Patient is currently in the ED on a stretcher, I have requested air mattress ([facility] contacted portable equipment) to deliver bed/mattress to his room in the ED since he is holding for an inpatient bed.  Off load heels with pillows. Patient prefers not to use Prevelon boots.   WOC ostomy consult note Stoma type/location: LLQ, end colostomy Ostomy pouching: 1pc.intact   Education provided: patient dependent in ostomy care.  Placed orders in the computer for ostomy products needed with Kellie Simmering numbers so that when the patient moves to the floor they staff will have the numbers they need.   Discussed POC with patient and bedside nurse.  Re consult if needed, will not follow at this time. Thanks  Osker Ayoub Kellogg, [place] [phone])

## 2015-05-29 NOTE — Progress Notes (Signed)
[facility]  Patient Status: Active (receiving services up to time of hospitalization)  [facility] is providing the following services: RN, OT, SW and HHA.  If patient discharges after hours, please call [phone].   [name] [name] 05/29/2015, 3:48 PM

## 2015-05-29 NOTE — Progress Notes (Signed)
Patient noted to have been admitted and discharged from the hospitall from 12/27 to 01/03.  Previous admitted for sepsis secondary to complicated Pseudomonas UTI.  Patient presents to ED with fever, tachycardia, hypotension.  Per chart review, patient discharged to home previous admission, with home health services arranged with [facility] for RN, PT, OT, aide and social work.  Patient with five admissions within the last six months.  EDCM infromed [facility] representative of patient's admission.  No further EDCM needs at this time.

## 2015-05-29 NOTE — ED Notes (Signed)
patient placed on air mattress. Patient back from CT at this time. The patient is alert and oriented at this time.

## 2015-05-29 NOTE — Progress Notes (Signed)
sbp in the 80's, sinus tachycardia in the low 100's, patient aaox3 , c/o feeling nauseous, chronic leaking suprapubic catheter, some loose stool in colostomy bag,will check  c diff,  reported increased drainage from sacral wound, Iv access obtained, awaiting Ct ab, wound care consult. pccm following,  ID and surgery consulted.
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
## 2015-05-29 NOTE — ED Notes (Signed)
Vancomycin was scanned on previous shift, but not hung at that time because IJ not confirmed at that time. Started at [employer]
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
## 2015-05-30 DIAGNOSIS — R7881 Bacteremia: Secondary | ICD-10-CM | POA: Diagnosis present

## 2015-05-30 LAB — CBC
HEMATOCRIT: 27.1 % — AB (ref 39.0–52.0)
HEMOGLOBIN: 8.1 g/dL — AB (ref 13.0–17.0)
MCH: 24.3 pg — AB (ref 26.0–34.0)
MCHC: 29.9 g/dL — AB (ref 30.0–36.0)
MCV: 81.4 fL (ref 78.0–100.0)
Platelets: 438 10*3/uL — ABNORMAL HIGH (ref 150–400)
RBC: 3.33 MIL/uL — ABNORMAL LOW (ref 4.22–5.81)
RDW: 18.4 % — ABNORMAL HIGH (ref 11.5–15.5)
WBC: 14 10*3/uL — ABNORMAL HIGH (ref 4.0–10.5)

## 2015-05-30 LAB — URINE CULTURE

## 2015-05-30 LAB — BASIC METABOLIC PANEL
Anion gap: 7 (ref 5–15)
BUN: 17 mg/dL (ref 6–20)
CHLORIDE: 112 mmol/L — AB (ref 101–111)
CO2: 24 mmol/L (ref 22–32)
CREATININE: 0.46 mg/dL — AB (ref 0.61–1.24)
Calcium: 8.3 mg/dL — ABNORMAL LOW (ref 8.9–10.3)
GFR calc non Af Amer: >60 mL/min (ref >60)
Glucose, Bld: 146 mg/dL — ABNORMAL HIGH (ref 65–99)
POTASSIUM: 3 mmol/L — AB (ref 3.5–5.1)
Sodium: 143 mmol/L (ref 135–145)

## 2015-05-30 LAB — MAGNESIUM: Magnesium: 1.6 mg/dL — ABNORMAL LOW (ref 1.7–2.4)

## 2015-05-30 LAB — GLUCOSE, CAPILLARY: GLUCOSE-CAPILLARY: 123 mg/dL — AB (ref 65–99)

## 2015-05-30 NOTE — Progress Notes (Signed)
PROGRESS NOTE  [name] [name] [id number] DOB: 1965/08/16 DOA: 05/28/2015 PCP: [name], MD  HPI/Recap of past 24 hours:  Feeling better, nausea has resolved, denies pain  Assessment/Plan: Principal Problem:   Septic shock ([place]) Active Problems:   Quadriplegia ([place])   Leukocytosis   Sacral decubitus ulcer, stage IV ([place])   Complicated UTI (urinary tract infection)   OSA on CPAP   Decubitus ulcer of lower extremity, stage 2   Pressure ulcer of right upper back   History of MDR Pseudomonas aeruginosa infection   Anemia of chronic disease   Sepsis ([place])  Sepsis/septic shock: on abx/ivf, remain in stepdown, critical care/infectious disease following  g- bacteremia: on zosyn.  UTI; with h/o MDRO, on zosyn  Chronic decubitus ulcer with osteo and involvement of hip and pelvis: surgery advised patient [facility] possible [facility] disarticulation, but no urgent surgery needed, appreciate surgery input.  Quadriplegic with colostomy and suprapubic catheter/osa/obesity: at baseline  Code Status: full  Family Communication: patient   Disposition Plan: remain in stepdown, infectious disease recommended palliative care consult on monday   Consultants:  Infectious disease  General surgery  Critical care  Wound care  Procedures:  none  Antibiotics:  vanc/zosyn   Objective: BP 90/53 mmHg  Pulse 81  Temp(Src) 97.6 F (36.4 C) (Oral)  Resp 16  Ht 6' (1.829 m)  Wt 91 kg (200 lb 9.9 oz)  BMI 27.20 kg/m2  SpO2 99%  Intake/Output Summary (Last 24 hours) at 05/30/15 0916 Last data filed at 05/30/15 0600  Gross per 24 hour  Intake   2040 ml  Output    545 ml  Net   1495 ml   Filed Weights   05/28/15 1951 05/29/15 1623  Weight: 91.627 kg (202 lb) 91 kg (200 lb 9.9 oz)    Exam:   General:  NAD  Cardiovascular: RRR  Respiratory: diminished at basis, no wheezing, no rales, no rhonchi  Abdomen: Soft/ND/NT, positive BS,  +colostomy + suprapubic catheter  Musculoskeletal: No Edema,   Neuro: aaox3, chronic quadriplegia, spastic, muscle atrophy  Skin:Large sacral decubitus ulcer with thick yellow malodorous drainage present. Large ulcer to right back as well. Less drainage than at sacrum. Bilateral heel ulcers are Stage 2 and without significant odor or drainage. He has an ulceration at the base of his toes on the right foot that does not have significant drainage either.  Data Reviewed: Basic Metabolic Panel:  Recent Labs Lab 05/28/15 2255 05/29/15 0527 05/29/15 1710 05/30/15 0410  NA 139 143 140 143  K 3.2* 3.3* 3.3* 3.0*  CL 102 111 109 112*  CO2 22 23 23 24   GLUCOSE 134* 140* 118* 146*  BUN 28* 26* 20 17  CREATININE 0.86 0.71 0.52* 0.46*  CALCIUM 8.7* 8.2* 7.8* 8.3*  MG  --   --   --  1.6*   Liver Function Tests:  Recent Labs Lab 05/28/15 2255 05/29/15 1710  AST 21 30  ALT 17 25  ALKPHOS 77 71  BILITOT 0.5 0.8  PROT 7.6 6.2*  ALBUMIN 2.6* 2.2*    Recent Labs Lab 05/28/15 2255  LIPASE 19   No results for input(s): AMMONIA in the last 168 hours. CBC:  Recent Labs Lab 05/28/15 2255 05/29/15 1710 05/30/15 0410  WBC 29.9* 12.5* 14.0*  NEUTROABS 25.7*  --   --   HGB 9.3* 7.8* 8.1*  HCT 29.7* 25.7* 27.1*  MCV 78.0 79.3 81.4  PLT 579* 401* 438*  Cardiac Enzymes:    Recent Labs Lab 05/29/15 1710  TROPONINI <0.03   BNP (last 3 results) No results for input(s): BNP in the last 8760 hours.  ProBNP (last 3 results) No results for input(s): PROBNP in the last 8760 hours.  CBG:  Recent Labs Lab 05/30/15 0721  GLUCAP 123*    Recent Results (from the past 240 hour(s))  Blood Culture (routine x 2)     Status: None (Preliminary result)   Collection Time: 05/28/15 10:55 PM  Result Value Ref Range Status   Specimen Description BLOOD RIGHT FEMORAL ARTERY  Final   Special Requests BOTTLES DRAWN AEROBIC AND ANAEROBIC 5CC  Final   Culture  Setup Time   Final     GRAM NEGATIVE RODS IN BOTH AEROBIC AND ANAEROBIC BOTTLES CRITICAL RESULT CALLED TO, READ BACK BY AND VERIFIED WITH: Cleotilde Neer RN 1911 05/29/15 A BROWNING    Culture   Final    TOO YOUNG TO READ Performed at [facility]    Report Status PENDING  Incomplete  MRSA PCR Screening     Status: Abnormal   Collection Time: 05/29/15  4:29 PM  Result Value Ref Range Status   MRSA by PCR INVALID RESULTS, SPECIMEN SENT FOR CULTURE (A) NEGATIVE Final    Comment: [place] [id number] @ [id number] BY J SCOTTON        The GeneXpert MRSA Assay (FDA approved for NASAL specimens only), is one component of a comprehensive MRSA colonization surveillance program. It is not intended to diagnose MRSA infection nor to guide or monitor treatment for MRSA infections.   C difficile quick scan w PCR reflex     Status: None   Collection Time: 05/29/15  6:49 PM  Result Value Ref Range Status   C Diff antigen NEGATIVE NEGATIVE Final   C Diff toxin NEGATIVE NEGATIVE Final   C Diff interpretation Negative for toxigenic C. difficile  Final     Studies: Ct Abdomen Pelvis W Contrast  05/29/2015  CLINICAL DATA:  50 year old quadriplegic male with large sacral decubitus ulcer, right upper back ulcers. Recent sepsis secondary to Pseudomonas urinary tract infection. Acute onset nausea today with chills, diaphoresis. Increased drainage from his wounds. Initial encounter. EXAM: CT ABDOMEN AND PELVIS WITH CONTRAST TECHNIQUE: Multidetector CT imaging of the abdomen and pelvis was performed using the standard protocol following bolus administration of intravenous contrast. CONTRAST:  120mL OMNIPAQUE IOHEXOL 300 MG/ML SOLN, 12mL OMNIPAQUE IOHEXOL 300 MG/ML SOLN COMPARISON:  CT Abdomen and Pelvis 03/16/2015 and earlier FINDINGS: Stable lung bases with mild to moderate left greater than right atelectasis. No pericardial or pleural effusion. Subacute to chronic appearing posterior left rib fractures are more apparent than in  October (left ninth and tenth posterior ribs). Stable visualized vertebral bodies. New confluent right lateral and posterior chest wall subcutaneous stranding and soft tissue thickening (series 3, image 12). No underlying osseous changes of the adjacent right ribs there may be a 3.5 cm associated fluid collection communicating to the skin surface on series 3, image 25. Severe chronic osteomyelitis at both hips with pathologic fractures appear stable since October. Posterior right hip wound which communicates throughout the joint space re- demonstrated. Interval decreased gas within the distal right psoas muscle. Granulation tissue about both hips appears stable. Chronic sacral decubitus wounds newly tracks to the posterior inferior right SI joint, with subtle osteolysis since October (series 3, image 71). Midline sacral wound otherwise appears stable. Chronic posterior and inferior sacral osteomyelitis with sclerosis otherwise is  stable. Suprapubic catheter, the urinary bladder is diminutive with a small volume of gas. No pelvic free fluid. Chronic distal colostomy colostomy is stable. Stable blind ending sigmoid and rectum with retained stool. Retained stool in the transverse colon. Negative right colon. No dilated small bowel. Small gastric hiatal hernia is unchanged. Negative duodenum. No abdominal free air or free fluid. Liver, gallbladder, spleen, pancreas, and adrenal glands are within normal limits. Major arterial structures appear patent. Renal enhancement and contrast excretion within normal limits. No hydroureter small abdominal lymph nodes are stable. IMPRESSION: 1. Since October new soft tissue wound/inflammation about the right lateral and posterior chest wall with no underlying rib changes. Possible 3.5 cm abscess communicating with the skin surface on series 3, image 25. 2. Progressed sacral decubitus wound since October, now communicating with the posterior inferior right SI joint where subtle  changes of osteomyelitis are new (series 3, image 71). 3. Stable abdominal and pelvic viscera. 4. Severe chronic hip osteomyelitis is stable to mildly improved. 5. Subacute to chronic posttraumatic appearing left ninth and tenth rib fractures were not apparent in October. Electronically Signed   By: Genevie Ann M.D.   On: 05/29/2015 14:43   Dg Chest Port 1 View  05/29/2015  CLINICAL DATA:  Respiratory failure. Central venous catheter placement EXAM: PORTABLE CHEST 1 VIEW COMPARISON:  Study obtained earlier in the day and April 27, 2015 ; May 10, 2014 FINDINGS: Central catheter tip is in the superior vena cava near the cavoatrial junction. No pneumothorax. There is stable asymmetric apical pleural thickening on the right. There is no edema or consolidation. The heart is borderline enlarged with pulmonary vascularity within normal limits. No adenopathy. There is a chronic destructive lesion involving the right posterior fifth rib. There is old rib trauma on the left. IMPRESSION: No central catheter tip in superior vena cava near cavoatrial junction. No pneumothorax. Chronic apical pleural thickening on the right. No lung edema or consolidation. Chronic destructive lesion right posterior fifth rib. Given the history of osteomyelitis elsewhere, question chronic osteomyelitis in this rib. Electronically Signed   By: Lowella Grip III M.D.   On: 05/29/2015 19:50    Scheduled Meds: . baclofen  20 mg Oral QID  . collagenase   Topical Daily  . darifenacin  15 mg Oral Daily  . docusate sodium  200 mg Oral BID  . enoxaparin (LOVENOX) injection  40 mg Subcutaneous Q24H  . ferrous sulfate  325 mg Oral TID WC  . hydrocortisone sod succinate (SOLU-CORTEF) inj  50 mg Intravenous Q6H  . magnesium oxide  400 mg Oral Daily  . metoCLOPramide  10 mg Oral TID AC  . mirabegron ER  25 mg Oral Daily  . multivitamin with minerals  1 tablet Oral q morning - 10a  . pantoprazole  40 mg Oral BID AC  .  piperacillin-tazobactam (ZOSYN)  IV  3.375 g Intravenous Q8H  . sodium chloride  500 mL Intravenous Once  . sodium chloride  3 mL Intravenous Q12H  . sucralfate  1 g Oral QID  . vancomycin  1,000 mg Intravenous 3 times per day  . vitamin C  500 mg Oral q morning - 10a  . zinc sulfate  220 mg Oral BID    Continuous Infusions: . sodium chloride 125 mL/hr at 05/29/15 1900     Time spent: 39mins  Kiyah Demartini MD, PhD  [facility] Pager [phone]. If 7PM-7AM, please contact night-coverage at [email or web address], password [email or web address] 05/30/2015, 9:16 AM  LOS: 1 day
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
## 2015-05-30 NOTE — Consult Note (Signed)
WOC ostomy follow up Stoma type/location: LLQ. This [place] nurse is requested to enter orders for ostomy supplies and stoma care by one of my partners who saw him yesterday. As she mentions, he is well known to our department. I last saw this patient on 04/27/15 (approximately one month ago). Stomal assessment/size: 1 and 1/4 inches round, moist red Peristomal assessment: intact Treatment options for stomal/peristomal skin: Skin barrier ring Output soft brown stool Ostomy pouching: 2pc., 2 and 3/4 inch pouching system with skin barrier ring Education provided: None.  Patient's caregiver manages ostomy and system with recently changed.  Orders are provided for in-house Nursing staff.  Patient is reminded to notify staff with pouching system is 1/3 to 1/2 full of flatus or stool. Enrolled patient in [place] Start Discharge program: No WOC nursing team will not follow, but will remain available to this patient, the nursing and medical teams.  Please re-consult if needed. Thanks, Maudie Flakes, MSN, RN, [place], Arther Abbott  Pager# [phone]

## 2015-05-30 NOTE — Consult Note (Signed)
WOC new consult this am, however WOC assessment and wound care orders written in ED yesterday 05/29/15. Please see consultation notes.   [name] [name], [name] [name]

## 2015-05-30 NOTE — Progress Notes (Signed)
Subjective: No complaints. Nausea resolved  Objective: Vital signs in last 24 hours: Temp:  [97.7 F (36.5 C)-101.4 F (38.6 C)] 97.7 F (36.5 C) (01/14 0400) Pulse Rate:  [60-115] 81 (01/14 0600) Resp:  [15-29] 16 (01/14 0600) BP: (72-152)/(40-96) 90/53 mmHg (01/14 0600) SpO2:  [88 %-100 %] 99 % (01/14 0600) Weight:  [91 kg (200 lb 9.9 oz)] 91 kg (200 lb 9.9 oz) (01/13 1623) Last BM Date: 05/29/15  Intake/Output from previous day: 01/13 0701 - 01/14 0700 In: 2040 [P.O.:140; I.V.:1000; IV Piggyback:900] Out: 545 [Urine:545] Intake/Output this shift:    Chest wall: no palpable fluctuance Pelvic: open wounds communicate with hip joint and SI joint  Lab Results:   Recent Labs  05/29/15 1710 05/30/15 0410  WBC 12.5* 14.0*  HGB 7.8* 8.1*  HCT 25.7* 27.1*  PLT 401* 438*   BMET  Recent Labs  05/29/15 1710 05/30/15 0410  NA 140 143  K 3.3* 3.0*  CL 109 112*  CO2 23 24  GLUCOSE 118* 146*  BUN 20 17  CREATININE 0.52* 0.46*  CALCIUM 7.8* 8.3*   PT/INR  Recent Labs  05/29/15 0527 05/29/15 1710  LABPROT 16.8* 17.6*  INR 1.35 1.44   ABG No results for input(s): PHART, HCO3 in the last 72 hours.  Invalid input(s): PCO2, PO2  Studies/Results: Dg Chest 2 View  05/28/2015  CLINICAL DATA:  Nausea, dizziness and chills today. Initial encounter. EXAM: CHEST  2 VIEW COMPARISON:  PA and lateral chest 05/12/2015. FINDINGS: The lungs are clear. Heart size is upper normal. No pneumothorax or pleural effusion. Scoliosis is noted. IMPRESSION: No acute disease. Electronically Signed   By: Inge Rise M.D.   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 Recent sepsis secondary to Pseudomonas urinary tract infection. Acute onset nausea today with chills, diaphoresis. Increased drainage from his wounds. Initial encounter. EXAM: CT ABDOMEN AND  PELVIS WITH CONTRAST TECHNIQUE: Multidetector CT imaging of the abdomen and pelvis was performed using the standard protocol following bolus administration of intravenous contrast. CONTRAST:  181mL OMNIPAQUE IOHEXOL 300 MG/ML SOLN, 24mL OMNIPAQUE IOHEXOL 300 MG/ML SOLN COMPARISON:  CT Abdomen and Pelvis 03/16/2015 and earlier FINDINGS: Stable lung bases with mild to moderate left greater than right atelectasis. No pericardial or pleural effusion. Subacute to chronic appearing posterior left rib fractures are more apparent than in October (left ninth and tenth posterior ribs). Stable visualized vertebral bodies. New confluent right lateral and posterior chest wall subcutaneous stranding and soft tissue thickening (series 3, image 12). No underlying osseous changes of the adjacent right ribs there may be a 3.5 cm associated fluid collection communicating to the skin surface on series 3, image 25. Severe chronic osteomyelitis at both hips with pathologic fractures appear stable since October. Posterior right hip wound which communicates throughout the joint space re- demonstrated. Interval decreased gas within the distal right psoas muscle. Granulation tissue about both hips appears stable. Chronic sacral decubitus wounds newly tracks to the posterior inferior right SI joint, with subtle osteolysis since October (series 3, image 71). Midline sacral wound otherwise appears stable. Chronic posterior and inferior sacral osteomyelitis with sclerosis otherwise is stable. Suprapubic catheter, the urinary bladder is diminutive with a small volume of gas. No pelvic free fluid. Chronic distal colostomy colostomy is stable. Stable blind ending sigmoid and rectum with retained stool. Retained stool in the transverse colon. Negative right colon.  No dilated small bowel. Small gastric hiatal hernia is unchanged. Negative duodenum. No abdominal free air or free fluid. Liver, gallbladder, spleen, pancreas, and adrenal glands are  within normal limits. Major arterial structures appear patent. Renal enhancement and contrast excretion within normal limits. No hydroureter small abdominal lymph nodes are stable. IMPRESSION: 1. Since October new soft tissue wound/inflammation about the right lateral and posterior chest wall with no underlying rib changes. Possible 3.5 cm abscess communicating with the skin surface on series 3, image 25. 2. Progressed sacral decubitus wound since October, now communicating with the posterior inferior right SI joint where subtle changes of osteomyelitis are new (series 3, image 71). 3. Stable abdominal and pelvic viscera. 4. Severe chronic hip osteomyelitis is stable to mildly improved. 5. Subacute to chronic posttraumatic appearing left ninth and tenth rib fractures were not apparent in October. Electronically Signed   By: Genevie Ann M.D.   On: 05/29/2015 14:43   Dg Chest Port 1 View  05/29/2015  CLINICAL DATA:  Respiratory failure. Central venous catheter placement EXAM: PORTABLE CHEST 1 VIEW COMPARISON:  Study obtained earlier in the day and April 27, 2015 ; May 10, 2014 FINDINGS: Central catheter tip is in the superior vena cava near the cavoatrial junction. No pneumothorax. There is stable asymmetric apical pleural thickening on the right. There is no edema or consolidation. The heart is borderline enlarged with pulmonary vascularity within normal limits. No adenopathy. There is a chronic destructive lesion involving the right posterior fifth rib. There is old rib trauma on the left. IMPRESSION: No central catheter tip in superior vena cava near cavoatrial junction. No pneumothorax. Chronic apical pleural thickening on the right. No lung edema or consolidation. Chronic destructive lesion right posterior fifth rib. Given the history of osteomyelitis elsewhere, question chronic osteomyelitis in this rib. Electronically Signed   By: Lowella Grip III M.D.   On: 05/29/2015 19:50   Dg Chest Portable 1  View  05/29/2015  CLINICAL DATA:  Central line placement. EXAM: PORTABLE CHEST 1 VIEW COMPARISON:  05/28/2015. FINDINGS: Right IJ line in stable position. Cardiomegaly. No pulmonary venous congestion. Low lung volumes with mild basilar atelectasis. No pleural effusion or pneumothorax. Old left posterior rib fractures. IMPRESSION: 1. Right IJ line stable position. 2. Cardiomegaly.  No pulmonary venous congestion. 3. Low lung volumes with mild basilar atelectasis . Electronically Signed   By: Marcello Moores  Register   On: 05/29/2015 07:28    Anti-infectives: Anti-infectives    Start     Dose/Rate Route Frequency Ordered Stop   05/29/15 1200  piperacillin-tazobactam (ZOSYN) IVPB 3.375 g     3.375 g 12.5 mL/hr over 240 Minutes Intravenous Every 8 hours 05/29/15 1019     05/29/15 1000  levofloxacin (LEVAQUIN) IVPB 750 mg  Status:  Discontinued     750 mg 100 mL/hr over 90 Minutes Intravenous Every 24 hours 05/29/15 0907 05/29/15 2139   05/29/15 0800  piperacillin-tazobactam (ZOSYN) IVPB 3.375 g  Status:  Discontinued     3.375 g 12.5 mL/hr over 240 Minutes Intravenous Every 8 hours 05/29/15 0025 05/29/15 1019   05/29/15 0600  levofloxacin (LEVAQUIN) IVPB 750 mg  Status:  Discontinued     750 mg 100 mL/hr over 90 Minutes Intravenous Every 24 hours 05/29/15 0235 05/29/15 0907   05/29/15 0300  vancomycin (VANCOCIN) IVPB 1000 mg/200 mL premix     1,000 mg 200 mL/hr over 60 Minutes Intravenous 3 times per day 05/29/15 0233     05/29/15 0015  piperacillin-tazobactam (  ZOSYN) IVPB 3.375 g     3.375 g 100 mL/hr over 30 Minutes Intravenous  Once 05/29/15 0000 05/29/15 0107      Assessment/Plan: s/p * No surgery found * With osteo and involvement of hip and pelvis I suspect he would need hip disarticulation. This would probably need to be done at an academic center and he already is followed at [facility]. No need for urgent surgery from my standpoint at this moment. Agree with abx and local wound care  LOS: 1 day     TOTH III,Kalysta Kneisley S 05/30/2015

## 2015-05-30 NOTE — Progress Notes (Signed)
Pharmacy Antibiotic Follow-up Note  [name] [name] is a 50 y.o. year-old male admitted on 05/28/2015.  The patient is currently on day 2 of vancomycin and zosyn for sepsis due to GNR bacteremia likely from worsening wounds, osteomyelitis, or complicated UTI (chronic catheter).  Assessment/Plan: Obtain vancomycin trough level tonight and adjust dose per level. Continue Zosyn 3.375g IV q8h (4 hour infusion time).  F/u culture results and ID recommendations.  Temp (24hrs), Avg:99 F (37.2 C), Min:97.6 F (36.4 C), Max:101.4 F (38.6 C)   Recent Labs Lab 05/28/15 2255 05/29/15 1710 05/30/15 0410  WBC 29.9* 12.5* 14.0*    Recent Labs Lab 05/28/15 2255 05/29/15 0527 05/29/15 1710 05/30/15 0410  CREATININE 0.86 0.71 0.52* 0.46*   Estimated Creatinine Clearance: 123.9 mL/min (by C-G formula based on Cr of 0.46).    Allergies  Allergen Reactions  . Ditropan [Oxybutynin] Other (See Comments)    hallucinations    Antimicrobials this admission: 1/13 >> zosyn  >> 1/13 >> vancomycin  >>   1/13 >> levaquin >> 1/13  Levels/dose changes this admission: 1/14 1700 VT ____ on 1g IV q8h regimen  Microbiology results: 1/13 blood: GNR 1/2 so far 1/13 urine: sent 1/13 cdiff neg 1/13 MRSA cx:   Thank you for allowing pharmacy to be a part of this patient's care.  Hershal Coria PharmD 05/30/2015 11:29 AM

## 2015-05-31 LAB — CBC
HCT: 26.8 % — ABNORMAL LOW (ref 39.0–52.0)
HEMOGLOBIN: 8 g/dL — AB (ref 13.0–17.0)
MCH: 24.3 pg — AB (ref 26.0–34.0)
MCHC: 29.9 g/dL — ABNORMAL LOW (ref 30.0–36.0)
MCV: 81.5 fL (ref 78.0–100.0)
Platelets: 444 10*3/uL — ABNORMAL HIGH (ref 150–400)
RBC: 3.29 MIL/uL — AB (ref 4.22–5.81)
RDW: 18.3 % — ABNORMAL HIGH (ref 11.5–15.5)
WBC: 19.9 10*3/uL — AB (ref 4.0–10.5)

## 2015-05-31 LAB — BASIC METABOLIC PANEL
ANION GAP: 8 (ref 5–15)
BUN: 14 mg/dL (ref 6–20)
CHLORIDE: 111 mmol/L (ref 101–111)
CO2: 22 mmol/L (ref 22–32)
Calcium: 8.5 mg/dL — ABNORMAL LOW (ref 8.9–10.3)
Creatinine, Ser: 0.58 mg/dL — ABNORMAL LOW (ref 0.61–1.24)
GFR calc Af Amer: >60 mL/min (ref >60)
Glucose, Bld: 178 mg/dL — ABNORMAL HIGH (ref 65–99)
POTASSIUM: 3.1 mmol/L — AB (ref 3.5–5.1)
SODIUM: 141 mmol/L (ref 135–145)

## 2015-05-31 LAB — MRSA CULTURE

## 2015-05-31 LAB — VANCOMYCIN, TROUGH: VANCOMYCIN TR: 25 ug/mL — AB (ref 10.0–20.0)

## 2015-05-31 MED ORDER — SODIUM CHLORIDE 0.9 % IJ SOLN
10.0000 mL | INTRAMUSCULAR | Status: DC | PRN
  Administered 2015-06-01 (×2): 20 mL
  Administered 2015-06-02 – 2015-06-09 (×13): 10 mL
  Filled 2015-05-31 (×15): qty 40

## 2015-05-31 MED ORDER — HYDROCORTISONE NA SUCCINATE PF 100 MG IJ SOLR
50.0000 mg | Freq: Two times a day (BID) | INTRAMUSCULAR | Status: AC
  Administered 2015-05-31 – 2015-06-01 (×3): 50 mg via INTRAVENOUS
  Filled 2015-05-31: qty 2
  Filled 2015-05-31 (×2): qty 1

## 2015-05-31 MED ORDER — MENTHOL 3 MG MT LOZG
1.0000 | LOZENGE | OROMUCOSAL | Status: DC | PRN
  Filled 2015-05-31: qty 9

## 2015-05-31 MED ORDER — POTASSIUM CHLORIDE CRYS ER 20 MEQ PO TBCR
40.0000 meq | EXTENDED_RELEASE_TABLET | Freq: Once | ORAL | Status: AC
  Administered 2015-05-31: 40 meq via ORAL
  Filled 2015-05-31: qty 2

## 2015-05-31 MED ORDER — VANCOMYCIN HCL IN DEXTROSE 1-5 GM/200ML-% IV SOLN
1000.0000 mg | Freq: Two times a day (BID) | INTRAVENOUS | Status: DC
  Administered 2015-05-31: 1000 mg via INTRAVENOUS
  Filled 2015-05-31: qty 200

## 2015-05-31 NOTE — Progress Notes (Addendum)
PROGRESS NOTE  [name] [name] [id number] DOB: Dec 18, 1965 DOA: 05/28/2015 PCP: [name], MD  HPI/Recap of past 24 hours:  Feeling better, nausea has resolved, denies pain bp fluctuating,   Assessment/Plan: Principal Problem:   Gram-negative bacteremia ([place]) Active Problems:   Quadriplegia ([place])   Leukocytosis   Sacral decubitus ulcer, stage IV ([name]   Complicated UTI (urinary tract infection)   OSA on CPAP   Chronic osteomyelitis, pelvic region and thigh ([place])   Decubitus ulcer of lower extremity, stage 2   Pressure ulcer of right upper back   History of MDR Pseudomonas aeruginosa infection   Anemia of chronic disease   Sepsis ([place])   Septic shock ([place])  Sepsis/septic shock: on abx/ivf, remain in stepdown, critical care/infectious disease following.  Better, d/c ivf. Taper stress dose steroids  g- bacteremia: on zosyn.  UTI; with h/o MDRO, on zosyn  Hypokalemia: replace k, check mag  Chronic decubitus ulcer with osteo and involvement of hip and pelvis: surgery advised patient can follow up at academic center for possible hip disarticulation, but no urgent surgery needed, appreciate surgery input.  Quadriplegic with colostomy and suprapubic catheter/osa/obesity: at baseline   FTT: patient has been quadriplegic for the last 29years, recently, frequent infection/ sepsis/septic shock required multiple hospitalization, MDRO organism, suprapubic cath, chronic decubitus, chronic osteomyelitis has progressed despite abx treatment, general srugery recommended hip disarticulation, patient reported he has been evaluated at [facility] for this and was told this is too high risk surgery. Infectious disease recommended palliative care consult, patient agree to talk to palliative care consult for goal of care discussion.  Code Status: full  Family Communication: patient   Disposition Plan: transfer out of  stepdown, infectious disease recommended palliative care  consult.   Consultants:  Infectious disease  General surgery  Critical care  Wound care  Palliative care  Procedures:  none  Antibiotics:  vanc/zosyn   Objective: BP 128/99 mmHg  Pulse 68  Temp(Src) 97.6 F (36.4 C) (Oral)  Resp 16  Ht 6' (1.829 m)  Wt 91 kg (200 lb 9.9 oz)  BMI 27.20 kg/m2  SpO2 100%  Intake/Output Summary (Last 24 hours) at 05/31/15 [id number] Last data filed at 05/31/15 0500  Gross per 24 hour  Intake   2725 ml  Output    640 ml  Net   2085 ml   Filed Weights   05/28/15 1951 05/29/15 1623  Weight: 91.627 kg (202 lb) 91 kg (200 lb 9.9 oz)    Exam:   General:  NAD  Cardiovascular: RRR  Respiratory: diminished at basis, no wheezing, no rales, no rhonchi  Abdomen: Soft/ND/NT, positive BS, +colostomy + suprapubic catheter  Musculoskeletal: No Edema,   Neuro: aaox3, chronic quadriplegia, spastic, muscle atrophy  Skin:Large sacral decubitus ulcer with thick yellow malodorous drainage present. Large ulcer to right back as well. Less drainage than at sacrum. Bilateral heel ulcers are Stage 2 and without significant odor or drainage. He has an ulceration at the base of his toes on the right foot that does not have significant drainage either.  Data Reviewed: Basic Metabolic Panel:  Recent Labs Lab 05/28/15 2255 05/29/15 0527 05/29/15 1710 05/30/15 0410 05/31/15 0115  NA 139 143 140 143 141  K 3.2* 3.3* 3.3* 3.0* 3.1*  CL 102 111 109 112* 111  CO2 22 23 23 24 22   GLUCOSE 134* 140* 118* 146* 178*  BUN 28* 26* 20 17 14   CREATININE 0.86 0.71 0.52* 0.46* 0.58*  CALCIUM 8.7* 8.2* 7.8*  8.3* 8.5*  MG  --   --   --  1.6*  --    Liver Function Tests:  Recent Labs Lab 05/28/15 2255 05/29/15 1710  AST 21 30  ALT 17 25  ALKPHOS 77 71  BILITOT 0.5 0.8  PROT 7.6 6.2*  ALBUMIN 2.6* 2.2*    Recent Labs Lab 05/28/15 2255  LIPASE 19   No results for input(s): AMMONIA in the last 168 hours. CBC:  Recent Labs Lab  05/28/15 2255 05/29/15 1710 05/30/15 0410 05/31/15 0115  WBC 29.9* 12.5* 14.0* 19.9*  NEUTROABS 25.7*  --   --   --   HGB 9.3* 7.8* 8.1* 8.0*  HCT 29.7* 25.7* 27.1* 26.8*  MCV 78.0 79.3 81.4 81.5  PLT 579* 401* 438* 444*   Cardiac Enzymes:    Recent Labs Lab 05/29/15 1710  TROPONINI <0.03   BNP (last 3 results) No results for input(s): BNP in the last 8760 hours.  ProBNP (last 3 results) No results for input(s): PROBNP in the last 8760 hours.  CBG:  Recent Labs Lab 05/30/15 0721  GLUCAP 123*    Recent Results (from the past 240 hour(s))  Urine culture     Status: None   Collection Time: 05/28/15  8:39 PM  Result Value Ref Range Status   Specimen Description URINE, CATHETERIZED  Final   Special Requests NONE  Final   Culture   Final    MULTIPLE SPECIES PRESENT, SUGGEST RECOLLECTION Performed at [facility]    Report Status 05/30/2015 FINAL  Final  Blood Culture (routine x 2)     Status: None (Preliminary result)   Collection Time: 05/28/15 10:55 PM  Result Value Ref Range Status   Specimen Description BLOOD RIGHT FEMORAL ARTERY  Final   Special Requests BOTTLES DRAWN AEROBIC AND ANAEROBIC 5CC  Final   Culture  Setup Time   Final    GRAM NEGATIVE RODS IN BOTH AEROBIC AND ANAEROBIC BOTTLES CRITICAL RESULT CALLED TO, READ BACK BY AND VERIFIED WITH: Cleotilde Neer RN 1911 05/29/15 A BROWNING    Culture   Final    TOO YOUNG TO READ Performed at [facility]    Report Status PENDING  Incomplete  Blood Culture (routine x 2)     Status: None (Preliminary result)   Collection Time: 05/29/15  6:00 AM  Result Value Ref Range Status   Specimen Description BLOOD CENTRAL LINE  Final   Special Requests BOTTLES DRAWN AEROBIC AND ANAEROBIC 5ML  Final   Culture   Final    NO GROWTH 1 DAY Performed at [facility]    Report Status PENDING  Incomplete  MRSA PCR Screening     Status: Abnormal   Collection Time: 05/29/15  4:29 PM  Result Value Ref Range  Status   MRSA by PCR INVALID RESULTS, SPECIMEN SENT FOR CULTURE (A) NEGATIVE Final    Comment: [place] [id number] @ [id number] BY J SCOTTON        The GeneXpert MRSA Assay (FDA approved for NASAL specimens only), is one component of a comprehensive MRSA colonization surveillance program. It is not intended to diagnose MRSA infection nor to guide or monitor treatment for MRSA infections.   C difficile quick scan w PCR reflex     Status: None   Collection Time: 05/29/15  6:49 PM  Result Value Ref Range Status   C Diff antigen NEGATIVE NEGATIVE Final   C Diff toxin NEGATIVE NEGATIVE Final   C Diff interpretation  Negative for toxigenic C. difficile  Final     Studies: No results found.  Scheduled Meds: . baclofen  20 mg Oral QID  . collagenase   Topical Daily  . darifenacin  15 mg Oral Daily  . docusate sodium  200 mg Oral BID  . enoxaparin (LOVENOX) injection  40 mg Subcutaneous Q24H  . ferrous sulfate  325 mg Oral TID WC  . hydrocortisone sod succinate (SOLU-CORTEF) inj  50 mg Intravenous Q6H  . magnesium oxide  400 mg Oral Daily  . metoCLOPramide  10 mg Oral TID AC  . mirabegron ER  25 mg Oral Daily  . multivitamin with minerals  1 tablet Oral q morning - 10a  . pantoprazole  40 mg Oral BID AC  . piperacillin-tazobactam (ZOSYN)  IV  3.375 g Intravenous Q8H  . potassium chloride  40 mEq Oral Once  . sodium chloride  500 mL Intravenous Once  . sodium chloride  3 mL Intravenous Q12H  . sucralfate  1 g Oral QID  . vancomycin  1,000 mg Intravenous Q12H  . vitamin C  500 mg Oral q morning - 10a  . zinc sulfate  220 mg Oral BID    Continuous Infusions: . sodium chloride 125 mL/hr at 05/31/15 0209     Time spent: 46mins  Kree Armato MD, PhD  [facility] Pager [phone]. If 7PM-7AM, please contact night-coverage at [email or web address], password [facility] 05/31/2015, 8:22 AM  LOS: 2 days

## 2015-05-31 NOTE — Progress Notes (Signed)
Utilization review completed.  

## 2015-05-31 NOTE — Progress Notes (Signed)
Patient ID: [name] [name], male   DOB: 1966/05/13, 50 y.o.   MRN: [id number]         [facility] for Infectious Disease   Total days of antibiotics 4         He has defervesced on empiric antibacterial therapy. He has gram-negative rod bacteremia. I will continue piperacillin tazobactam and stop vancomycin now. His plastic surgeon at [facility] felt that his wounds had progressed to a point where surgery was no longer an option. I would recommend a palliative care consult tomorrow to attempt to establish realistic goals of care.        Michel Bickers, MD [facility] for [facility] [place] [facility] [phone] pager   [phone] cell 05/19/2015, 1:32 PM

## 2015-05-31 NOTE — Progress Notes (Signed)
Rx Brief Antibiotic note:  IV Vancomycin  See 1/14 note by A Runyon for full details  Assessement:  0115 VT=25 on 1Gm IV q8h LD 1/14 1730 (above goal of 15-20 mg/L)  Scr stable  Plan:  Decrease Vancomycin to 1 Gm IV q12h  F/u Scr/cultures/levels as needed  Dorrene German 05/31/2015 2:15 AM

## 2015-05-31 NOTE — Progress Notes (Signed)
Patient is falling asleep and having episodes of apnea that results in oxygen desaturation. Patient is refusing CPAP at this time.

## 2015-06-01 ENCOUNTER — Encounter: Payer: Medicare Other | Admitting: Hematology

## 2015-06-01 ENCOUNTER — Encounter: Payer: Self-pay | Admitting: Hematology

## 2015-06-01 ENCOUNTER — Ambulatory Visit: Payer: Medicare Other

## 2015-06-01 ENCOUNTER — Other Ambulatory Visit: Payer: Medicare Other

## 2015-06-01 DIAGNOSIS — R7989 Other specified abnormal findings of blood chemistry: Secondary | ICD-10-CM | POA: Insufficient documentation

## 2015-06-01 DIAGNOSIS — D75838 Other thrombocytosis: Secondary | ICD-10-CM | POA: Insufficient documentation

## 2015-06-01 LAB — BASIC METABOLIC PANEL
ANION GAP: 9 (ref 5–15)
BUN: 13 mg/dL (ref 6–20)
CHLORIDE: 110 mmol/L (ref 101–111)
CO2: 24 mmol/L (ref 22–32)
Calcium: 8.4 mg/dL — ABNORMAL LOW (ref 8.9–10.3)
Creatinine, Ser: 0.46 mg/dL — ABNORMAL LOW (ref 0.61–1.24)
GFR calc non Af Amer: >60 mL/min (ref >60)
Glucose, Bld: 131 mg/dL — ABNORMAL HIGH (ref 65–99)
Potassium: 2.9 mmol/L — ABNORMAL LOW (ref 3.5–5.1)
Sodium: 143 mmol/L (ref 135–145)

## 2015-06-01 LAB — CBC
HEMATOCRIT: 26.7 % — AB (ref 39.0–52.0)
HEMOGLOBIN: 8 g/dL — AB (ref 13.0–17.0)
MCH: 24.4 pg — ABNORMAL LOW (ref 26.0–34.0)
MCHC: 30 g/dL (ref 30.0–36.0)
MCV: 81.4 fL (ref 78.0–100.0)
Platelets: 463 10*3/uL — ABNORMAL HIGH (ref 150–400)
RBC: 3.28 MIL/uL — ABNORMAL LOW (ref 4.22–5.81)
RDW: 18.4 % — ABNORMAL HIGH (ref 11.5–15.5)
WBC: 17.5 10*3/uL — AB (ref 4.0–10.5)

## 2015-06-01 LAB — CULTURE, BLOOD (ROUTINE X 2)

## 2015-06-01 LAB — MAGNESIUM: Magnesium: 1.6 mg/dL — ABNORMAL LOW (ref 1.7–2.4)

## 2015-06-01 LAB — MRSA PCR SCREENING: MRSA by PCR: NEGATIVE

## 2015-06-01 MED ORDER — MAGNESIUM SULFATE 2 GM/50ML IV SOLN
2.0000 g | Freq: Once | INTRAVENOUS | Status: AC
  Administered 2015-06-01: 2 g via INTRAVENOUS
  Filled 2015-06-01: qty 50

## 2015-06-01 MED ORDER — PREDNISONE 50 MG PO TABS
50.0000 mg | ORAL_TABLET | Freq: Every day | ORAL | Status: DC
  Administered 2015-06-02: 50 mg via ORAL
  Filled 2015-06-01: qty 1

## 2015-06-01 MED ORDER — POTASSIUM CHLORIDE CRYS ER 20 MEQ PO TBCR
40.0000 meq | EXTENDED_RELEASE_TABLET | Freq: Two times a day (BID) | ORAL | Status: AC
  Administered 2015-06-01 (×2): 40 meq via ORAL
  Filled 2015-06-01 (×2): qty 2

## 2015-06-01 NOTE — Progress Notes (Signed)
Patient ID: [name] [name], male   DOB: 26-Jan-1966, 50 y.o.   MRN: [id number]     [facility]      [address] [place], [place] [address]    Phone: [phone] FAX: [phone]     Subjective: Lives at home with family, has [facility] and family member change dressing once daily. Afebrile.  WBC coming down.  Objective:  Vital signs:  Filed Vitals:   05/31/15 1400 05/31/15 1540 05/31/15 2059 06/01/15 0437  BP: 108/54 150/82 117/66 102/68  Pulse: 80 88 78 81  Temp:  98.8 F (37.1 C) 98.1 F (36.7 C) 98.1 F (36.7 C)  TempSrc:  Oral Oral Axillary  Resp: 21 20 20 20   Height:      Weight:      SpO2: 100% 100% 96% 99%    Last BM Date: 05/31/15  Intake/Output   Yesterday:  01/15 0701 - 01/16 0700 In: 2550 [P.O.:2160; I.V.:40; IV Piggyback:350] Out: 1500 [Urine:1300; Stool:200] This shift:     Physical Exam: General: Pt awake/alert/oriented x4 in no acute distress  Skin: sacral decub extending sacrum/ischium/right pelvis.  Essentially clean.  The right hip, superior up to trochanteric region is without puruelent drainage but has necrotic tissue at the base.  Sacrum area with necrosis with visible bone.  80% of the wound is clean.   Problem List:   Principal Problem:   Gram-negative bacteremia ([place]) Active Problems:   Quadriplegia ([place])   Leukocytosis   Sacral decubitus ulcer, stage IV ([name]   Complicated UTI (urinary tract infection)   OSA on CPAP   Chronic osteomyelitis, pelvic region and thigh ([name]   Decubitus ulcer of lower extremity, stage 2   Pressure ulcer of right upper back   History of MDR Pseudomonas aeruginosa infection   Anemia of chronic disease   Sepsis ([place])   Septic shock ([place])    Results:   Labs: Results for orders placed or performed during the hospital encounter of 05/28/15 (from the past 48 hour(s))  CBC     Status: Abnormal   Collection Time: 05/31/15  1:15 AM  Result Value Ref Range   WBC  19.9 (H) 4.0 - 10.5 K/uL   RBC 3.29 (L) 4.22 - 5.81 MIL/uL   Hemoglobin 8.0 (L) 13.0 - 17.0 g/dL   HCT 26.8 (L) 39.0 - 52.0 %   MCV 81.5 78.0 - 100.0 fL   MCH 24.3 (L) 26.0 - 34.0 pg   MCHC 29.9 (L) 30.0 - 36.0 g/dL   RDW 18.3 (H) 11.5 - 15.5 %   Platelets 444 (H) 150 - 400 K/uL  Basic metabolic panel     Status: Abnormal   Collection Time: 05/31/15  1:15 AM  Result Value Ref Range   Sodium 141 135 - 145 mmol/L   Potassium 3.1 (L) 3.5 - 5.1 mmol/L   Chloride 111 101 - 111 mmol/L   CO2 22 22 - 32 mmol/L   Glucose, Bld 178 (H) 65 - 99 mg/dL   BUN 14 6 - 20 mg/dL   Creatinine, Ser 0.58 (L) 0.61 - 1.24 mg/dL   Calcium 8.5 (L) 8.9 - 10.3 mg/dL   GFR calc non Af Amer >60 >60 mL/min   GFR calc Af Amer >60 >60 mL/min    Comment: (NOTE) The eGFR has been calculated using the CKD EPI equation. This calculation has not been validated in all clinical situations. eGFR's persistently <60 mL/min signify possible Chronic Kidney Disease.  Anion gap 8 5 - 15  Vancomycin, trough     Status: Abnormal   Collection Time: 05/31/15  1:15 AM  Result Value Ref Range   Vancomycin Tr 25 (H) 10.0 - 20.0 ug/mL  Wound culture     Status: None (Preliminary result)   Collection Time: 05/31/15  5:02 AM  Result Value Ref Range   Specimen Description SACRAL    Special Requests NONE    Gram Stain      FEW WBC NO SQUAMOUS EPITHELIAL CELLS SEEN FEW GRAM NEGATIVE RODS Performed at [facility]    Culture      Culture reincubated for better growth Performed at [facility]    Report Status PENDING   MRSA PCR Screening     Status: None   Collection Time: 05/31/15 12:05 PM  Result Value Ref Range   MRSA by PCR NEGATIVE NEGATIVE    Comment:        The GeneXpert MRSA Assay (FDA approved for NASAL specimens only), is one component of a comprehensive MRSA colonization surveillance program. It is not intended to diagnose MRSA infection nor to guide or monitor treatment for MRSA  infections. DELTA CHECK NOTED   CBC     Status: Abnormal   Collection Time: 06/01/15  4:10 AM  Result Value Ref Range   WBC 17.5 (H) 4.0 - 10.5 K/uL   RBC 3.28 (L) 4.22 - 5.81 MIL/uL   Hemoglobin 8.0 (L) 13.0 - 17.0 g/dL   HCT 26.7 (L) 39.0 - 52.0 %   MCV 81.4 78.0 - 100.0 fL   MCH 24.4 (L) 26.0 - 34.0 pg   MCHC 30.0 30.0 - 36.0 g/dL   RDW 18.4 (H) 11.5 - 15.5 %   Platelets 463 (H) 150 - 400 K/uL  Basic metabolic panel     Status: Abnormal   Collection Time: 06/01/15  4:10 AM  Result Value Ref Range   Sodium 143 135 - 145 mmol/L   Potassium 2.9 (L) 3.5 - 5.1 mmol/L   Chloride 110 101 - 111 mmol/L   CO2 24 22 - 32 mmol/L   Glucose, Bld 131 (H) 65 - 99 mg/dL   BUN 13 6 - 20 mg/dL   Creatinine, Ser 0.46 (L) 0.61 - 1.24 mg/dL   Calcium 8.4 (L) 8.9 - 10.3 mg/dL   GFR calc non Af Amer >60 >60 mL/min   GFR calc Af Amer >60 >60 mL/min    Comment: (NOTE) The eGFR has been calculated using the CKD EPI equation. This calculation has not been validated in all clinical situations. eGFR's persistently <60 mL/min signify possible Chronic Kidney Disease.    Anion gap 9 5 - 15  Magnesium     Status: Abnormal   Collection Time: 06/01/15  4:10 AM  Result Value Ref Range   Magnesium 1.6 (L) 1.7 - 2.4 mg/dL    Imaging / Studies: No results found.  Medications / Allergies:  Scheduled Meds: . baclofen  20 mg Oral QID  . collagenase   Topical Daily  . darifenacin  15 mg Oral Daily  . docusate sodium  200 mg Oral BID  . enoxaparin (LOVENOX) injection  40 mg Subcutaneous Q24H  . ferrous sulfate  325 mg Oral TID WC  . hydrocortisone sod succinate (SOLU-CORTEF) inj  50 mg Intravenous Q12H  . magnesium oxide  400 mg Oral Daily  . magnesium sulfate 1 - 4 g bolus IVPB  2 g Intravenous Once  . metoCLOPramide  10  mg Oral TID AC  . mirabegron ER  25 mg Oral Daily  . multivitamin with minerals  1 tablet Oral q morning - 10a  . pantoprazole  40 mg Oral BID AC  . piperacillin-tazobactam (ZOSYN)   IV  3.375 g Intravenous Q8H  . potassium chloride  40 mEq Oral BID  . [START ON 06/02/2015] predniSONE  50 mg Oral Q breakfast  . sodium chloride  500 mL Intravenous Once  . sodium chloride  3 mL Intravenous Q12H  . sucralfate  1 g Oral QID  . vitamin C  500 mg Oral q morning - 10a  . zinc sulfate  220 mg Oral BID   Continuous Infusions:  PRN Meds:.acetaminophen **OR** acetaminophen, LORazepam, menthol-cetylpyridinium, ondansetron (ZOFRAN) IV, oxyCODONE, promethazine, sodium chloride  Antibiotics: Anti-infectives    Start     Dose/Rate Route Frequency Ordered Stop   05/31/15 0800  vancomycin (VANCOCIN) IVPB 1000 mg/200 mL premix  Status:  Discontinued     1,000 mg 200 mL/hr over 60 Minutes Intravenous Every 12 hours 05/31/15 0211 05/31/15 1020   05/29/15 1200  piperacillin-tazobactam (ZOSYN) IVPB 3.375 g     3.375 g 12.5 mL/hr over 240 Minutes Intravenous Every 8 hours 05/29/15 1019     05/29/15 1000  levofloxacin (LEVAQUIN) IVPB 750 mg  Status:  Discontinued     750 mg 100 mL/hr over 90 Minutes Intravenous Every 24 hours 05/29/15 0907 05/29/15 2139   05/29/15 0800  piperacillin-tazobactam (ZOSYN) IVPB 3.375 g  Status:  Discontinued     3.375 g 12.5 mL/hr over 240 Minutes Intravenous Every 8 hours 05/29/15 0025 05/29/15 1019   05/29/15 0600  levofloxacin (LEVAQUIN) IVPB 750 mg  Status:  Discontinued     750 mg 100 mL/hr over 90 Minutes Intravenous Every 24 hours 05/29/15 0235 05/29/15 0907   05/29/15 0300  vancomycin (VANCOCIN) IVPB 1000 mg/200 mL premix  Status:  Discontinued     1,000 mg 200 mL/hr over 60 Minutes Intravenous 3 times per day 05/29/15 0233 05/31/15 0211   05/29/15 0015  piperacillin-tazobactam (ZOSYN) IVPB 3.375 g     3.375 g 100 mL/hr over 30 Minutes Intravenous  Once 05/29/15 0000 05/29/15 0107        Assessment/Plan Extensive sacral/ischial/pelvic decubiti-there is a approximately 3x6cm area on the sacrum with necrosis, would benefit from PT hydrotherapy,  but should not keep the patient from discharge if felt stable.  There is a smaller approximately 2x3cm area right buttock with necrosis would again benefit from pt hydrotherapy.   Continue BID wet to to dry dressing changes, frequent turns, high protein diet.  Agree with palliative care consult.  Plastics at [facility] do not have anything to offer the patient according to latest progress notes.  Doubt this is the source of his sepsis.  Has presumed chronic osteomyelitis with visible sacral bone.  Please call [facility] with further assistance.   Erby Pian, [facility] Pager [phone])   06/01/2015 9:33 AM

## 2015-06-01 NOTE — Progress Notes (Signed)
Pharmacy Antibiotic Follow-up Note  [name] [name] is a 50 y.o. year-old male admitted on 05/28/2015.  The patient is currently on day 4 of Zosyn 3.375 grams IV q8h (extended-infusion)  for treatment of osteomyelitis and P.stuartii bacteremia.  Assessment: P. Stuartii blood isolate is S to Zosyn According to plastic surgeon from [facility], wounds have progressed to the point where surgery is no longer an option.  Plan: Continue Zosyn at present dosage for now. Considering palliative care consult to establish goals of care (see I.D. Note from 05/31/15)   Temp (24hrs), Avg:98.3 F (36.8 C), Min:98.1 F (36.7 C), Max:98.8 F (37.1 C)   Recent Labs Lab 05/28/15 2255 05/29/15 1710 05/30/15 0410 05/31/15 0115 06/01/15 0410  WBC 29.9* 12.5* 14.0* 19.9* 17.5*    Recent Labs Lab 05/29/15 0527 05/29/15 1710 05/30/15 0410 05/31/15 0115 06/01/15 0410  CREATININE 0.71 0.52* 0.46* 0.58* 0.46*   Estimated Creatinine Clearance: 123.9 mL/min (by C-G formula based on Cr of 0.46).    Allergies  Allergen Reactions  . Ditropan [Oxybutynin] Other (See Comments)    hallucinations    Antimicrobials this admission: 1/13 >> zosyn  >> 1/13 >> vancomycin  >>  1/15 1/13 >> levaquin >> 1/13  Levels/dose changes this admission: --  Microbiology results: 1/13 blood: 1 of 2 sets growing Providencia stuartii, S to Zosyn, cefepime, ceftriaxone, ceftazidime, imipenem.    Thank you for allowing pharmacy to be a part of this patient's care.  Joycelyn Rua PharmD, BCPS 06/01/2015 12:03 PM

## 2015-06-01 NOTE — Progress Notes (Signed)
PROGRESS NOTE  HELIO TEDFORD [id number] DOB: May 26, 1965 DOA: 05/28/2015 PCP: [name], MD  HPI/Recap of past 24 hours:  Feeling better, denies pain, no n/v, bp stable   Assessment/Plan: Principal Problem:   Gram-negative bacteremia ([place]) Active Problems:   Quadriplegia ([place])   Leukocytosis   Sacral decubitus ulcer, stage IV ([name]   Complicated UTI (urinary tract infection)   OSA on CPAP   Chronic osteomyelitis, pelvic region and thigh ([place])   Decubitus ulcer of lower extremity, stage 2   Pressure ulcer of right upper back   History of MDR Pseudomonas aeruginosa infection   Anemia of chronic disease   Sepsis ([place])   Septic shock ([place])  Sepsis/septic shock:  on abx/ivf, admitted to stepdown, critical care/infectious disease following.  Better, d/c ivf. Taper stress dose steroids Now in tele bed  g- bacteremia: on zosyn.  UTI; with h/o MDRO, on zosyn  Hypokalemia/hypomagnesemia: replace k/mag  Chronic decubitus ulcer with osteo and involvement of hip and pelvis: surgery [facility], but no urgent surgery needed, appreciate surgery input. Surgery recommended hydrotherapy to sacral wound.  Quadriplegic with colostomy and suprapubic catheter/osa/obesity: at baseline   FTT: patient has been quadriplegic for the last 29years, recently, frequent infection/ sepsis/septic shock required multiple hospitalization, MDRO organism, suprapubic cath, chronic decubitus, chronic osteomyelitis has progressed despite abx treatment, general srugery recommended hip disarticulation, patient reported he has been evaluated at [facility] for this and was told this is too high risk surgery. Infectious disease recommended palliative care consult, patient agree to talk to palliative care consult for goal of care discussion.  Code Status: full  Family Communication: patient   Disposition Plan:  infectious disease  recommended palliative care consult. LTAC placement?  Consultants:  Infectious disease  General surgery  Critical care  Wound care  Palliative care  Procedures:  none  Antibiotics:  levaquin x2 dose on admission.  vanc from admission to  1/15  Zosyn from admission   Objective: BP 102/68 mmHg  Pulse 81  Temp(Src) 98.1 F (36.7 C) (Axillary)  Resp 20  Ht 6' (1.829 m)  Wt 91 kg (200 lb 9.9 oz)  BMI 27.20 kg/m2  SpO2 99%  Intake/Output Summary (Last 24 hours) at 06/01/15 0855 Last data filed at 06/01/15 0700  Gross per 24 hour  Intake   2330 ml  Output   1500 ml  Net    830 ml   Filed Weights   05/28/15 1951 05/29/15 1623  Weight: 91.627 kg (202 lb) 91 kg (200 lb 9.9 oz)    Exam:   General:  NAD  Cardiovascular: RRR  Respiratory: diminished at basis, no wheezing, no rales, no rhonchi  Abdomen: Soft/ND/NT, positive BS, +colostomy + suprapubic catheter  Musculoskeletal: No Edema,   Neuro: aaox3, chronic quadriplegia, spastic, muscle atrophy  Skin:Large sacral decubitus ulcer with thick yellow malodorous drainage present. Large ulcer to right back as well. Less drainage than at sacrum. Bilateral heel ulcers are Stage 2 and without significant odor or drainage. He has an ulceration at the base of his toes on the right foot that does not have significant drainage either.  Data Reviewed: Basic Metabolic Panel:  Recent Labs Lab 05/29/15 0527 05/29/15 1710 05/30/15 0410 05/31/15 0115 06/01/15 0410  NA 143 140 143 141 143  K 3.3* 3.3* 3.0* 3.1* 2.9*  CL 111 109 112* 111 110  CO2 23 23 24 22 24   GLUCOSE 140* 118* 146* 178*  131*  BUN 26* 20 17 14 13   CREATININE 0.71 0.52* 0.46* 0.58* 0.46*  CALCIUM 8.2* 7.8* 8.3* 8.5* 8.4*  MG  --   --  1.6*  --  1.6*   Liver Function Tests:  Recent Labs Lab 05/28/15 2255 05/29/15 1710  AST 21 30  ALT 17 25  ALKPHOS 77 71  BILITOT 0.5 0.8  PROT 7.6 6.2*  ALBUMIN 2.6* 2.2*    Recent Labs Lab  05/28/15 2255  LIPASE 19   No results for input(s): AMMONIA in the last 168 hours. CBC:  Recent Labs Lab 05/28/15 2255 05/29/15 1710 05/30/15 0410 05/31/15 0115 06/01/15 0410  WBC 29.9* 12.5* 14.0* 19.9* 17.5*  NEUTROABS 25.7*  --   --   --   --   HGB 9.3* 7.8* 8.1* 8.0* 8.0*  HCT 29.7* 25.7* 27.1* 26.8* 26.7*  MCV 78.0 79.3 81.4 81.5 81.4  PLT 579* 401* 438* 444* 463*   Cardiac Enzymes:    Recent Labs Lab 05/29/15 1710  TROPONINI <0.03   BNP (last 3 results) No results for input(s): BNP in the last 8760 hours.  ProBNP (last 3 results) No results for input(s): PROBNP in the last 8760 hours.  CBG:  Recent Labs Lab 05/30/15 0721  GLUCAP 123*    Recent Results (from the past 240 hour(s))  Urine culture     Status: None   Collection Time: 05/28/15  8:39 PM  Result Value Ref Range Status   Specimen Description URINE, CATHETERIZED  Final   Special Requests NONE  Final   Culture   Final    MULTIPLE SPECIES PRESENT, SUGGEST RECOLLECTION Performed at [facility]    Report Status 05/30/2015 FINAL  Final  Blood Culture (routine x 2)     Status: None (Preliminary result)   Collection Time: 05/28/15 10:55 PM  Result Value Ref Range Status   Specimen Description BLOOD RIGHT FEMORAL ARTERY  Final   Special Requests BOTTLES DRAWN AEROBIC AND ANAEROBIC 5CC  Final   Culture  Setup Time   Final    GRAM NEGATIVE RODS IN BOTH AEROBIC AND ANAEROBIC BOTTLES CRITICAL RESULT CALLED TO, READ BACK BY AND VERIFIED WITH: Cleotilde Neer RN 1911 05/29/15 A BROWNING    Culture   Final    GRAM NEGATIVE RODS Performed at [facility]    Report Status PENDING  Incomplete  Blood Culture (routine x 2)     Status: None (Preliminary result)   Collection Time: 05/29/15  6:00 AM  Result Value Ref Range Status   Specimen Description BLOOD CENTRAL LINE  Final   Special Requests BOTTLES DRAWN AEROBIC AND ANAEROBIC 5ML  Final   Culture   Final    NO GROWTH 2 DAYS Performed at  [facility]    Report Status PENDING  Incomplete  MRSA PCR Screening     Status: Abnormal   Collection Time: 05/29/15  4:29 PM  Result Value Ref Range Status   MRSA by PCR INVALID RESULTS, SPECIMEN SENT FOR CULTURE (A) NEGATIVE Final    Comment: [place] [id number] @ [id number] BY J SCOTTON        The GeneXpert MRSA Assay (FDA approved for NASAL specimens only), is one component of a comprehensive MRSA colonization surveillance program. It is not intended to diagnose MRSA infection nor to guide or monitor treatment for MRSA infections.   MRSA culture     Status: None   Collection Time: 05/29/15  4:30 PM  Result Value Ref Range  Status   Specimen Description NOSE  Final   Special Requests NONE  Final   Culture NOMRSA Performed at [facility]   Final   Report Status 05/31/2015 FINAL  Final  C difficile quick scan w PCR reflex     Status: None   Collection Time: 05/29/15  6:49 PM  Result Value Ref Range Status   C Diff antigen NEGATIVE NEGATIVE Final   C Diff toxin NEGATIVE NEGATIVE Final   C Diff interpretation Negative for toxigenic C. difficile  Final  Wound culture     Status: None (Preliminary result)   Collection Time: 05/31/15  5:02 AM  Result Value Ref Range Status   Specimen Description SACRAL  Final   Special Requests NONE  Final   Gram Stain   Final    FEW WBC NO SQUAMOUS EPITHELIAL CELLS SEEN FEW GRAM NEGATIVE RODS Performed at [facility]    Culture   Final    Culture reincubated for better growth Performed at [facility]    Report Status PENDING  Incomplete  MRSA PCR Screening     Status: None   Collection Time: 05/31/15 12:05 PM  Result Value Ref Range Status   MRSA by PCR NEGATIVE NEGATIVE Final    Comment:        The GeneXpert MRSA Assay (FDA approved for NASAL specimens only), is one component of a comprehensive MRSA colonization surveillance program. It is not intended to diagnose MRSA infection nor to guide  or monitor treatment for MRSA infections. DELTA CHECK NOTED      Studies: No results found.  Scheduled Meds: . baclofen  20 mg Oral QID  . collagenase   Topical Daily  . darifenacin  15 mg Oral Daily  . docusate sodium  200 mg Oral BID  . enoxaparin (LOVENOX) injection  40 mg Subcutaneous Q24H  . ferrous sulfate  325 mg Oral TID WC  . hydrocortisone sod succinate (SOLU-CORTEF) inj  50 mg Intravenous Q12H  . magnesium oxide  400 mg Oral Daily  . magnesium sulfate 1 - 4 g bolus IVPB  2 g Intravenous Once  . metoCLOPramide  10 mg Oral TID AC  . mirabegron ER  25 mg Oral Daily  . multivitamin with minerals  1 tablet Oral q morning - 10a  . pantoprazole  40 mg Oral BID AC  . piperacillin-tazobactam (ZOSYN)  IV  3.375 g Intravenous Q8H  . potassium chloride  40 mEq Oral BID  . [START ON 06/02/2015] predniSONE  50 mg Oral Q breakfast  . sodium chloride  500 mL Intravenous Once  . sodium chloride  3 mL Intravenous Q12H  . sucralfate  1 g Oral QID  . vitamin C  500 mg Oral q morning - 10a  . zinc sulfate  220 mg Oral BID    Continuous Infusions:     Time spent: 34mins  Anuradha Chabot MD, PhD  [facility] Pager [phone]. If 7PM-7AM, please contact night-coverage at [email or web address], password [facility] 06/01/2015, 8:55 AM  LOS: 3 days

## 2015-06-01 NOTE — Progress Notes (Signed)
No show  This encounter was created in error - please disregard.

## 2015-06-01 NOTE — Progress Notes (Signed)
PT Cancellation Note  Patient Details Name: LAMBERTO ELGART MRN: [id number] DOB: [birth year]   Cancelled Treatment:    Reason Eval/Treat Not Completed:  (note Palliative care consult and will await  [place] prior to initiating [facility] therapy.)   Claretha Cooper 06/01/2015, 12:43 PM Tresa Endo PT [phone]

## 2015-06-01 NOTE — Care Management Note (Signed)
Case Management Note  Patient Details  Name: DAIVD KITZMANN MRN: [id number] Date of Birth: 1965-08-02  Subjective/Objective: [facility]-patient meets criteria-they will send to insurance company-awaiting approval. Await palliative cons. [facility] already following.                  Action/Plan:d/c plan likely home w/HHC.   Expected Discharge Date:                  Expected Discharge Plan:  [place]  In-House Referral:     Discharge planning Services  CM Consult  Post Acute Care Choice:    Choice offered to:     DME Arranged:    DME Agency:     HH Arranged:    [place] Agency:     Status of Service:  In process, will continue to follow  Medicare Important Message Given:  Yes Date Medicare IM Given:    Medicare IM give by:    Date Additional Medicare IM Given:    Additional Medicare Important Message give by:     If discussed at [place] of Stay Meetings, dates discussed:    Additional Comments:  Dessa Phi, RN 06/01/2015, 3:13 PM

## 2015-06-01 NOTE — Progress Notes (Signed)
[place] for Infectious Disease    Date of Admission:  05/28/2015   Total days of antibiotics 5        Day 5 piptazo           ID: GEMINI BLANCHETT is a 50 y.o. male with incomplete quadriplegia with chronic folly, and  Chronic decub ulses and osteo presenting with sepsis found to have providencia bacteremia Principal Problem:   Gram-negative bacteremia ([place]) Active Problems:   Quadriplegia ([place])   Leukocytosis   Sacral decubitus ulcer, stage IV ([name]   Complicated UTI (urinary tract infection)   OSA on CPAP   Chronic osteomyelitis, pelvic region and thigh ([place])   Decubitus ulcer of lower extremity, stage 2   Pressure ulcer of right upper back   History of MDR Pseudomonas aeruginosa infection   Anemia of chronic disease   Sepsis ([place])   Septic shock ([name]    Subjective: Afebrile, feeling better.   Medications:  . baclofen  20 mg Oral QID  . collagenase   Topical Daily  . darifenacin  15 mg Oral Daily  . docusate sodium  200 mg Oral BID  . enoxaparin (LOVENOX) injection  40 mg Subcutaneous Q24H  . ferrous sulfate  325 mg Oral TID WC  . magnesium oxide  400 mg Oral Daily  . metoCLOPramide  10 mg Oral TID AC  . mirabegron ER  25 mg Oral Daily  . multivitamin with minerals  1 tablet Oral q morning - 10a  . pantoprazole  40 mg Oral BID AC  . piperacillin-tazobactam (ZOSYN)  IV  3.375 g Intravenous Q8H  . potassium chloride  40 mEq Oral BID  . [START ON 06/02/2015] predniSONE  50 mg Oral Q breakfast  . sodium chloride  500 mL Intravenous Once  . sodium chloride  3 mL Intravenous Q12H  . sucralfate  1 g Oral QID  . vitamin C  500 mg Oral q morning - 10a  . zinc sulfate  220 mg Oral BID    Objective: Vital signs in last 24 hours: Temp:  [97.5 F (36.4 C)-98.1 F (36.7 C)] 97.5 F (36.4 C) (01/16 1322) Pulse Rate:  [78-85] 85 (01/16 1322) Resp:  [18-20] 18 (01/16 1322) BP: (102-120)/(66-80) 120/80 mmHg (01/16 1322) SpO2:  [96 %-99 %] 98 % (01/16  1322) Physical Exam  Constitutional: He is oriented to person, place, and time. He appears well-developed and well-nourished. No distress. Listening on headphones HENT:  Mouth/Throat: Oropharynx is clear and moist. No oropharyngeal exudate.  Cardiovascular: Normal rate, regular rhythm and normal heart sounds. Exam reveals no gallop and no friction rub.  No murmur heard.  Pulmonary/Chest: Effort normal and breath sounds normal. No respiratory distress. He has no wheezes.  Neurological: flaccid paralysis except partial movement to left arm Skin:i  did not examine wounds today Psychiatric: He has a normal mood and affect. His behavior is normal.     Lab Results  Recent Labs  05/31/15 0115 06/01/15 0410  WBC 19.9* 17.5*  HGB 8.0* 8.0*  HCT 26.8* 26.7*  NA 141 143  K 3.1* 2.9*  CL 111 110  CO2 22 24  BUN 14 13  CREATININE 0.58* 0.46*    Microbiology: Wound cx pending  Blood cx 1/2 on 1/13 + providencia S cefepime, ceftaz, pip/tazo, imi, ctx. R cefazolin, R FQ, R sulfa  Assessment/Plan: Providencia bacteremia likely 2/2 chronic foley vs. Decub ulcers= wound cx is pending. Continue with piptazo for now given his wounds.  Recommend to switch out his right IJ since that was placed in setting of sepsis when he had bacteremia. Would treat for a total of 14days   Delainee Tramel, [facility] for Infectious Diseases Cell: [phone] Pager: [phone]  06/01/2015, 5:46 PM

## 2015-06-01 NOTE — Clinical Documentation Improvement (Signed)
Internal Medicine  A cause and effect relationship may not be assumed and must be documented by a provider.  Please clarify the relationship, if any, between "Sepsis" and "leaking suprapubic catheter".  Are the conditions:   Due to or associated with each other  Unrelated to each other  Other  Clinically Undetermined  Please exercise your independent, professional judgment when responding. A specific answer is not anticipated or expected.  Thank You,  Ezekiel Ina RN [place] [phone]

## 2015-06-01 NOTE — Care Management Note (Signed)
Case Management Note  Patient Details  Name: [name] [name] MRN: [id number] Date of Birth: [birth year]  Subjective/Objective: Will explore LTACH-referral to [facility]-await assessment-complex wound care,iv abx. Also noted Palliative cons.                   Action/Plan:d/c plan home.   Expected Discharge Date:                  Expected Discharge Plan:     In-House Referral:     Discharge planning Services     Post Acute Care Choice:    Choice offered to:     DME Arranged:    DME Agency:     HH Arranged:    [place] Agency:     Status of Service:     Medicare Important Message Given:  Yes Date Medicare IM Given:    Medicare IM give by:    Date Additional Medicare IM Given:    Additional Medicare Important Message give by:     If discussed at [place] of Stay Meetings, dates discussed:    Additional Comments:  Dessa Phi, RN 06/01/2015, 11:39 AM

## 2015-06-01 NOTE — Care Management Important Message (Signed)
Important Message  Patient Details IM Letter given to [name]/Case Manager to present to Patient Name: FURKAN GEREN MRN: [id number] Date of Birth: [birth year]   Medicare Important Message Given:  Yes    [name] 06/01/2015, 10:50 AMImportant Message  Patient Details  Name: RUDRANSH ANNEN MRN: [id number] Date of Birth: [birth year]   Medicare Important Message Given:  Yes    [name] 06/01/2015, 10:49 AM

## 2015-06-02 DIAGNOSIS — Z515 Encounter for palliative care: Secondary | ICD-10-CM

## 2015-06-02 LAB — BASIC METABOLIC PANEL
Anion gap: 7 (ref 5–15)
BUN: 14 mg/dL (ref 6–20)
CHLORIDE: 113 mmol/L — AB (ref 101–111)
CO2: 26 mmol/L (ref 22–32)
CREATININE: 0.49 mg/dL — AB (ref 0.61–1.24)
Calcium: 8.6 mg/dL — ABNORMAL LOW (ref 8.9–10.3)
GFR calc Af Amer: >60 mL/min (ref >60)
GFR calc non Af Amer: >60 mL/min (ref >60)
Glucose, Bld: 89 mg/dL (ref 65–99)
POTASSIUM: 3.1 mmol/L — AB (ref 3.5–5.1)
Sodium: 146 mmol/L — ABNORMAL HIGH (ref 135–145)

## 2015-06-02 LAB — MAGNESIUM: Magnesium: 1.9 mg/dL (ref 1.7–2.4)

## 2015-06-02 LAB — WOUND CULTURE

## 2015-06-02 LAB — CBC
HEMATOCRIT: 26.8 % — AB (ref 39.0–52.0)
HEMOGLOBIN: 8 g/dL — AB (ref 13.0–17.0)
MCH: 24.1 pg — AB (ref 26.0–34.0)
MCHC: 29.9 g/dL — AB (ref 30.0–36.0)
MCV: 80.7 fL (ref 78.0–100.0)
Platelets: 476 10*3/uL — ABNORMAL HIGH (ref 150–400)
RBC: 3.32 MIL/uL — ABNORMAL LOW (ref 4.22–5.81)
RDW: 18.4 % — ABNORMAL HIGH (ref 11.5–15.5)
WBC: 18.2 10*3/uL — ABNORMAL HIGH (ref 4.0–10.5)

## 2015-06-02 MED ORDER — UNJURY VANILLA POWDER
8.0000 [oz_av] | Freq: Two times a day (BID) | ORAL | Status: DC
  Administered 2015-06-03: 8 [oz_av] via ORAL
  Filled 2015-06-02 (×3): qty 27

## 2015-06-02 MED ORDER — BOOST PLUS PO LIQD
237.0000 mL | Freq: Three times a day (TID) | ORAL | Status: DC
  Administered 2015-06-02 – 2015-06-08 (×16): 237 mL via ORAL
  Filled 2015-06-02 (×18): qty 237

## 2015-06-02 MED ORDER — PREDNISONE 20 MG PO TABS
20.0000 mg | ORAL_TABLET | Freq: Every day | ORAL | Status: AC
  Administered 2015-06-03: 20 mg via ORAL
  Filled 2015-06-02: qty 1

## 2015-06-02 NOTE — Progress Notes (Signed)
PT Cancellation Note  Patient Details Name: [name] [name] MRN: [id number] DOB: [birth year]   Cancelled Treatment:    Reason Eval/Treat Not Completed: Other (comment) (Palliative care just entered room. Will await GOC and indication for hydrotherapy /wound care. will follow up in AM. t)   Claretha Cooper 06/02/2015, 3:36 PM Tresa Endo PT [phone]
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
## 2015-06-02 NOTE — Care Management Note (Signed)
Case Management Note  Patient Details  Name: [name] [name] MRN: [id number] Date of Birth: [birth year]  Subjective/Objective:   [facility] not in network w/insurance company. [facility] will send info to insurance company-await outcome.  Patient updated.Also await palliative cons.[facility] still following for d/c if home.                 Action/Plan:d/c plan home.   Expected Discharge Date:                  Expected Discharge Plan:  [place]  In-House Referral:     Discharge planning Services  CM Consult  Post Acute Care Choice:    Choice offered to:     DME Arranged:    DME Agency:     HH Arranged:    [place] Agency:     Status of Service:  In process, will continue to follow  Medicare Important Message Given:  Yes Date Medicare IM Given:    Medicare IM give by:    Date Additional Medicare IM Given:    Additional Medicare Important Message give by:     If discussed at [place] of Stay Meetings, dates discussed:    Additional Comments:  Dessa Phi, RN 06/02/2015, 2:06 PM
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
## 2015-06-03 ENCOUNTER — Inpatient Hospital Stay (HOSPITAL_COMMUNITY): Payer: Medicare Other

## 2015-06-03 DIAGNOSIS — A498 Other bacterial infections of unspecified site: Secondary | ICD-10-CM | POA: Diagnosis present

## 2015-06-03 DIAGNOSIS — Z515 Encounter for palliative care: Secondary | ICD-10-CM

## 2015-06-03 LAB — BASIC METABOLIC PANEL
ANION GAP: 4 — AB (ref 5–15)
BUN: 13 mg/dL (ref 6–20)
CALCIUM: 8.4 mg/dL — AB (ref 8.9–10.3)
CO2: 28 mmol/L (ref 22–32)
CREATININE: 0.46 mg/dL — AB (ref 0.61–1.24)
Chloride: 110 mmol/L (ref 101–111)
Glucose, Bld: 108 mg/dL — ABNORMAL HIGH (ref 65–99)
Potassium: 2.9 mmol/L — ABNORMAL LOW (ref 3.5–5.1)
SODIUM: 142 mmol/L (ref 135–145)

## 2015-06-03 LAB — CBC
HCT: 26 % — ABNORMAL LOW (ref 39.0–52.0)
HEMOGLOBIN: 8 g/dL — AB (ref 13.0–17.0)
MCH: 24.6 pg — ABNORMAL LOW (ref 26.0–34.0)
MCHC: 30.8 g/dL (ref 30.0–36.0)
MCV: 80 fL (ref 78.0–100.0)
PLATELETS: 471 10*3/uL — AB (ref 150–400)
RBC: 3.25 MIL/uL — AB (ref 4.22–5.81)
RDW: 18.6 % — ABNORMAL HIGH (ref 11.5–15.5)
WBC: 23.2 10*3/uL — AB (ref 4.0–10.5)

## 2015-06-03 LAB — URINE CULTURE

## 2015-06-03 LAB — CULTURE, BLOOD (ROUTINE X 2): CULTURE: NO GROWTH

## 2015-06-03 LAB — HEMOGLOBIN A1C
HEMOGLOBIN A1C: 5.9 % — AB (ref 4.8–5.6)
MEAN PLASMA GLUCOSE: 123 mg/dL

## 2015-06-03 MED ORDER — POTASSIUM CHLORIDE CRYS ER 20 MEQ PO TBCR
40.0000 meq | EXTENDED_RELEASE_TABLET | Freq: Three times a day (TID) | ORAL | Status: AC
  Administered 2015-06-03 – 2015-06-04 (×3): 40 meq via ORAL
  Filled 2015-06-03 (×3): qty 2

## 2015-06-03 MED ORDER — UNJURY CHOCOLATE CLASSIC POWDER
8.0000 [oz_av] | Freq: Two times a day (BID) | ORAL | Status: DC
  Administered 2015-06-04 – 2015-06-05 (×2): 8 [oz_av] via ORAL
  Filled 2015-06-03 (×11): qty 27

## 2015-06-03 MED ORDER — LIDOCAINE HCL 1 % IJ SOLN
INTRAMUSCULAR | Status: AC
  Filled 2015-06-03: qty 20

## 2015-06-03 MED ORDER — MAGNESIUM SULFATE 2 GM/50ML IV SOLN
2.0000 g | Freq: Once | INTRAVENOUS | Status: AC
  Administered 2015-06-03: 2 g via INTRAVENOUS
  Filled 2015-06-03: qty 50

## 2015-06-03 NOTE — Progress Notes (Signed)
RT placed patient on CPAP. Patient home setting is 19 cmH2O. Sterile water was added to water chamber for humidification. Patient is tolerating well. RT will continue to monitor.
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
## 2015-06-03 NOTE — Care Management Note (Signed)
Case Management Note  Patient Details  Name: MUNG HOUGE MRN: [id number] Date of Birth: 12-Jan-1966  Subjective/Objective:    Noted has picc. ID-iv abx 14 days. If not appropriate for LTACH-still awaiting if auth given, then can plan on home w/HHC, long term iv abx can be arranged with orders.                Action/Plan:d/c plan home/LTACH-awaiting   Expected Discharge Date:                  Expected Discharge Plan:  [place]  In-House Referral:     Discharge planning Services  CM Consult  Post Acute Care Choice:    Choice offered to:     DME Arranged:    DME Agency:     HH Arranged:    HH Agency:     Status of Service:  In process, will continue to follow  Medicare Important Message Given:  Yes Date Medicare IM Given:    Medicare IM give by:    Date Additional Medicare IM Given:    Additional Medicare Important Message give by:     If discussed at [place] of Stay Meetings, dates discussed:    Additional Comments:  Dessa Phi, RN 06/03/2015, 3:01 PM

## 2015-06-03 NOTE — Care Management Note (Signed)
Case Management Note  Patient Details  Name: ERSKIN FEDDER MRN: [id number] Date of Birth: 19-Feb-1966  Subjective/Objective: Noted palliative-patient wants aggressive treatment, full code. Ltach-[facility]--meets criteria, waiting for outcome if insurance will auth.Await response. Patient declines SNF.Active w/[facility]-HHRN-rep still following. If d/c plan is home will need HHRN-specific wound care orders, f62f. PICC today.                 Action/Plan:d/c plan ?LTACH/HHC.   Expected Discharge Date:                  Expected Discharge Plan:  [place]  In-House Referral:     Discharge planning Services  CM Consult  Post Acute Care Choice:    Choice offered to:     DME Arranged:    DME Agency:     HH Arranged:    [place] Agency:     Status of Service:  In process, will continue to follow  Medicare Important Message Given:  Yes Date Medicare IM Given:    Medicare IM give by:    Date Additional Medicare IM Given:    Additional Medicare Important Message give by:     If discussed at [place] of Stay Meetings, dates discussed:    Additional Comments:  Dessa Phi, RN 06/03/2015, 11:02 AM

## 2015-06-03 NOTE — Progress Notes (Addendum)
HYDROTHERAPY NOTE- PLS was initiated today. The patient's dressings and bed were soaked with urine. RN in to assist with dressing changes and linen changes. Noted urine oozing from suprapubic catheter. It is the clinical judgement of this writer that  Due to long standing presence and extensive involvlment of sacral tissue of  Wounds as well as (per chart)  not surgical candidte, PT will provide Hydrotherapy  3x / week- MWF as the prognosis for wounds healing is Guarded.Tresa Endo PT [phone]

## 2015-06-03 NOTE — Progress Notes (Signed)
Pt wants CPAP at around midnight.  RT to monitor and assess as needed.

## 2015-06-03 NOTE — Procedures (Signed)
Successful LUE DL POWER PICC TIP SVC/RA NO COMP READY FOR USE FULL REPORT IN PACS

## 2015-06-03 NOTE — Progress Notes (Addendum)
   06/03/15 1600 HYDROTHERAPY EVALUATION  Subjective Assessment  Subjective I have had this before  Patient and Family Stated Goals agreed to wound care.  Date of Onset (present several months)  Prior Treatments HH  dressing changes  Evaluation and Treatment  Evaluation and Treatment Procedures Explained to Patient/Family Yes  Evaluation and Treatment Procedures agreed to  Wound / Incision (Open or Dehisced) 06/03/15 Other (Comment) Sacrum Other (Comment) large open wound on buttocks with 2 areas within that have slough.  Date First Assessed/Time First Assessed: 06/03/15 1140   Wound Type: (c) Other (Comment)  Location: Sacrum  Location Orientation: (c) Other (Comment)  Wound Description (Comments): large open wound on buttocks with 2 areas within that have slough.  Pr...  Dressing Type ABD;Moist to dry (santyl)  Dressing Changed New  Dressing Status Clean  Dressing Change Frequency Daily  Site / Wound Assessment Bleeding;Granulation tissue;Yellow  % Wound base Red or Granulating 80%  % Wound base Yellow 20%  Peri-wound Assessment Edema  Wound Length (cm) (# 1 inside hole near hip joint 2 cm  # 2 is on L sacrum = 4 )  Wound Width (cm) (# 1 2 cm, # 2 4 cm)  Wound Depth (cm) (# 1 N/a-flap of material, # 2 =.5)  Margins Unattached edges (unapproximated)  Drainage Amount Copious (dressing saturated in urine.)  Drainage Description (urine)  Non-staged Wound Description Full thickness  Treatment Cleansed;Hydrotherapy (Pulse lavage);Packing (Saline gauze) (santyl)  Hydrotherapy  Pulsed lavage therapy - wound location # 1 is R hip location  with eschar, #2 is L sacrum  Pulsed Lavage with Suction (psi) 8 psi  Pulsed Lavage with Suction - Normal Saline Used 1000 mL  Pulsed Lavage Tip Tip with splash shield  Wound Therapy - Assess/Plan/Recommendations  Wound Therapy - Clinical Statement Upon inspection, patient's bed saturated with urine from suprpubic catheter. The dressing on sacrum is  saturated with urine. RN notified. Found to have a large open full thickness wound that is mostly pink with some bleeding  Extending entire width of sacrum, through to scrotum. Open hole at R hip joint. that has 2 areas with slough. The open wound has granulation tissue.. Patient requires 2-3  assist for postioning. The legs are "windswept". Patient is quadriplegic.  Per EPIC noteds,  the wounds are not  amenable to surgicle repair. Patient has had a flap in past. Patient lives at home with 24/7 caregivers. Due to letgth of time that the wounds  have existed, Hydrotherapy will be administered  Monday, Wednesday , Friday.  Wound Therapy - Functional Problem List quadriplegia of longstanding  Factors Delaying/Impairing Wound Healing Altered sensation;Incontinence;Infection - systemic/local;Immobility;Multiple medical problems  Hydrotherapy Plan Patient/family education;Pulsatile lavage with suction (for a trial period.)  Wound Therapy - Frequency 3X / week  Wound Therapy - Current Recommendations Case manager/social work  Wound Plan trial of PLS,  Wound Therapy Goals - Improve the function of patient's integumentary system by progressing the wound(s) through the phases of wound healing by:  Decrease Necrotic Tissue to 10  Decrease Necrotic Tissue - Progress Goal set today  Increase Granulation Tissue to 90  Increase Granulation Tissue - Progress Goal set today  Improve Drainage Characteristics Min  Improve Drainage Characteristics - Progress Goal set today  Goals/treatment plan/discharge plan were made with and agreed upon by patient/family Yes  Time For Goal Achievement 2 weeks  Wound Therapy - Potential for Goals Poor  Tresa Endo PT [phone]

## 2015-06-03 NOTE — Progress Notes (Signed)
[facility] PROGRESS NOTE  KAHLO KLECHA [id number] DOB: [birth year] DOA: 05/28/2015 PCP: [name], MD  Summary 06/03/15: I have seen and examined Mr. [place] at bedside and reviewed his chart. Appreciate ID/palliative care. Patient with quadriplegia being treated for sepsis related to Providencia, also has pseudomonas in urine culture, chronic osteomyelitis, etc. He has worsening leukocytosis despite antibiotics and has been referred to palliative care. Central line was removed today and this may have been source of infection. The concern is that his white count has increased to 23,000 today. We'll continue current antibiotics, follow white count and follow infectious diseases in a.m. patient being considered for LTAC. Plan Gram-negative bacteremia ([name]/Leukocytosis/Sacral decubitus ulcer, stage IV ([name]/Complicated UTI (urinary tract infection)/Chronic osteomyelitis, pelvic region and thigh ([name]/Decubitus ulcer of lower extremity, stage 2/Pressure ulcer of right upper back/History of MDR Pseudomonas aeruginosa infection/Sepsis ([name]/Septic shock ([name]  Continue current antibiotics per ID  Follow white count  Follow ID in a.m. Quadriplegia ([name]/OSA on CPAP/Anemia of chronic disease   Continue supportive measures Hypokalemia/hypomagnesemia  Replenish electrolytes as necessary Code Status: Full Code Family Communication: Family member over the phone Disposition Plan: [place] or home   Consultants:  Palliative care  ID  Procedures:    Antibiotics:  Per ID  HPI/Subjective: No specific complaints  Objective: Filed Vitals:   06/03/15 0427 06/03/15 1456  BP: 124/77 133/80  Pulse: 68 76  Temp: 98 F (36.7 C) 97.8 F (36.6 C)  Resp: 20 20    Intake/Output Summary (Last 24 hours) at 06/03/15 2137 Last data filed at 06/03/15 1859  Gross per 24 hour  Intake   2690 ml  Output   3500 ml  Net   -810 ml   Filed Weights   05/28/15 1951 05/29/15 1623   Weight: 91.627 kg (202 lb) 91 kg (200 lb 9.9 oz)    Exam:   General:  Comfortable at rest.  Cardiovascular: S1-S2 normal. No murmurs. Pulse regular.  Respiratory: Good air entry bilaterally. No rhonchi or rales.  Abdomen: Soft and nontender. Normal bowel sounds. No organomegaly. Suprapubic catheter/colostomy  Musculoskeletal: No pedal edema   Neurological: No acute changes  Data Reviewed: Basic Metabolic Panel:  Recent Labs Lab 05/30/15 0410 05/31/15 0115 06/01/15 0410 06/02/15 0308 06/03/15 0404  NA 143 141 143 146* 142  K 3.0* 3.1* 2.9* 3.1* 2.9*  CL 112* 111 110 113* 110  CO2 24 22 24 26 28   GLUCOSE 146* 178* 131* 89 108*  BUN 17 14 13 14 13   CREATININE 0.46* 0.58* 0.46* 0.49* 0.46*  CALCIUM 8.3* 8.5* 8.4* 8.6* 8.4*  MG 1.6*  --  1.6* 1.9  --    Liver Function Tests:  Recent Labs Lab 05/28/15 2255 05/29/15 1710  AST 21 30  ALT 17 25  ALKPHOS 77 71  BILITOT 0.5 0.8  PROT 7.6 6.2*  ALBUMIN 2.6* 2.2*    Recent Labs Lab 05/28/15 2255  LIPASE 19   No results for input(s): AMMONIA in the last 168 hours. CBC:  Recent Labs Lab 05/28/15 2255  05/30/15 0410 05/31/15 0115 06/01/15 0410 06/02/15 0308 06/03/15 0404  WBC 29.9*  < > 14.0* 19.9* 17.5* 18.2* 23.2*  NEUTROABS 25.7*  --   --   --   --   --   --   HGB 9.3*  < > 8.1* 8.0* 8.0* 8.0* 8.0*  HCT 29.7*  < > 27.1* 26.8* 26.7* 26.8* 26.0*  MCV 78.0  < > 81.4 81.5 81.4 80.7 80.0  PLT 579*  < >  438* 444* 463* 476* 471*  < > = values in this interval not displayed. Cardiac Enzymes:  Recent Labs Lab 05/29/15 1710  TROPONINI <0.03   BNP (last 3 results) No results for input(s): BNP in the last 8760 hours.  ProBNP (last 3 results) No results for input(s): PROBNP in the last 8760 hours.  CBG:  Recent Labs Lab 05/30/15 0721  GLUCAP 123*    Recent Results (from the past 240 hour(s))  Urine culture     Status: None   Collection Time: 05/28/15  8:39 PM  Result Value Ref Range Status    Specimen Description URINE, CATHETERIZED  Final   Special Requests NONE  Final   Culture   Final    MULTIPLE SPECIES PRESENT, SUGGEST RECOLLECTION Performed at [facility]    Report Status 05/30/2015 FINAL  Final  Blood Culture (routine x 2)     Status: None   Collection Time: 05/28/15 10:55 PM  Result Value Ref Range Status   Specimen Description BLOOD RIGHT FEMORAL ARTERY  Final   Special Requests BOTTLES DRAWN AEROBIC AND ANAEROBIC 5CC  Final   Culture  Setup Time   Final    GRAM NEGATIVE RODS IN BOTH AEROBIC AND ANAEROBIC BOTTLES CRITICAL RESULT CALLED TO, READ BACK BY AND VERIFIED WITHCleotilde Neer RN 1911 05/29/15 A BROWNING    Culture   Final    PROVIDENCIA STUARTII Performed at [facility]    Report Status 06/01/2015 FINAL  Final   Organism ID, Bacteria PROVIDENCIA STUARTII  Final      Susceptibility   Providencia stuartii - MIC*    AMPICILLIN >=32 RESISTANT Resistant     CEFAZOLIN >=64 RESISTANT Resistant     CEFEPIME <=1 SENSITIVE Sensitive     CEFTAZIDIME <=1 SENSITIVE Sensitive     CEFTRIAXONE <=1 SENSITIVE Sensitive     CIPROFLOXACIN >=4 RESISTANT Resistant     GENTAMICIN 8 RESISTANT Resistant     IMIPENEM 2 SENSITIVE Sensitive     TRIMETH/SULFA >=320 RESISTANT Resistant     AMPICILLIN/SULBACTAM >=32 RESISTANT Resistant     PIP/TAZO <=4 SENSITIVE Sensitive     * PROVIDENCIA STUARTII  Blood Culture (routine x 2)     Status: None   Collection Time: 05/29/15  6:00 AM  Result Value Ref Range Status   Specimen Description BLOOD CENTRAL LINE  Final   Special Requests BOTTLES DRAWN AEROBIC AND ANAEROBIC 5ML  Final   Culture   Final    NO GROWTH 5 DAYS Performed at [facility]    Report Status 06/03/2015 FINAL  Final  MRSA PCR Screening     Status: Abnormal   Collection Time: 05/29/15  4:29 PM  Result Value Ref Range Status   MRSA by PCR INVALID RESULTS, SPECIMEN SENT FOR CULTURE (A) NEGATIVE Final    Comment: [place] [id number] @ [id number]  BY J SCOTTON        The GeneXpert MRSA Assay (FDA approved for NASAL specimens only), is one component of a comprehensive MRSA colonization surveillance program. It is not intended to diagnose MRSA infection nor to guide or monitor treatment for MRSA infections.   MRSA culture     Status: None   Collection Time: 05/29/15  4:30 PM  Result Value Ref Range Status   Specimen Description NOSE  Final   Special Requests NONE  Final   Culture NOMRSA Performed at [facility]   Final   Report Status 05/31/2015 FINAL  Final  C  difficile quick scan w PCR reflex     Status: None   Collection Time: 05/29/15  6:49 PM  Result Value Ref Range Status   C Diff antigen NEGATIVE NEGATIVE Final   C Diff toxin NEGATIVE NEGATIVE Final   C Diff interpretation Negative for toxigenic C. difficile  Final  Wound culture     Status: None   Collection Time: 05/31/15  5:02 AM  Result Value Ref Range Status   Specimen Description SACRAL  Final   Special Requests NONE  Final   Gram Stain   Final    FEW WBC NO SQUAMOUS EPITHELIAL CELLS SEEN FEW GRAM NEGATIVE RODS Performed at [facility]    Culture   Final    MULTIPLE ORGANISMS PRESENT, NONE PREDOMINANT Note: NO STAPHYLOCOCCUS AUREUS ISOLATED NO GROUP A STREP (S.PYOGENES) ISOLATED Performed at [facility]    Report Status 06/02/2015 FINAL  Final  Urine culture     Status: None   Collection Time: 05/31/15  5:02 AM  Result Value Ref Range Status   Specimen Description URINE, CATHETERIZED  Final   Special Requests NONE  Final   Culture   Final    60,000 COLONIES/ml PSEUDOMONAS AERUGINOSA 60,000 COLONIES/ml PROVIDENCIA STUARTII Performed at [facility]    Report Status 06/03/2015 FINAL  Final   Organism ID, Bacteria PSEUDOMONAS AERUGINOSA  Final   Organism ID, Bacteria PROVIDENCIA STUARTII  Final      Susceptibility   Pseudomonas aeruginosa - MIC*    CEFTAZIDIME 4 SENSITIVE Sensitive     CIPROFLOXACIN 2  INTERMEDIATE Intermediate     GENTAMICIN <=1 SENSITIVE Sensitive     IMIPENEM >=16 RESISTANT Resistant     PIP/TAZO 16 SENSITIVE Sensitive     CEFEPIME 8 SENSITIVE Sensitive     * 60,000 COLONIES/ml PSEUDOMONAS AERUGINOSA   Providencia stuartii - MIC*    AMPICILLIN >=32 RESISTANT Resistant     CEFAZOLIN >=64 RESISTANT Resistant     CEFTRIAXONE <=1 SENSITIVE Sensitive     CIPROFLOXACIN >=4 RESISTANT Resistant     GENTAMICIN 8 RESISTANT Resistant     IMIPENEM 2 SENSITIVE Sensitive     NITROFURANTOIN 128 RESISTANT Resistant     TRIMETH/SULFA >=320 RESISTANT Resistant     AMPICILLIN/SULBACTAM >=32 RESISTANT Resistant     PIP/TAZO <=4 SENSITIVE Sensitive     * 60,000 COLONIES/ml PROVIDENCIA STUARTII  MRSA PCR Screening     Status: None   Collection Time: 05/31/15 12:05 PM  Result Value Ref Range Status   MRSA by PCR NEGATIVE NEGATIVE Final    Comment:        The GeneXpert MRSA Assay (FDA approved for NASAL specimens only), is one component of a comprehensive MRSA colonization surveillance program. It is not intended to diagnose MRSA infection nor to guide or monitor treatment for MRSA infections. DELTA CHECK NOTED      Studies: Ir Fluoro Guide Cv Line Left  06/03/2015  CLINICAL DATA:  Sepsis EXAM: POWER PICC LINE PLACEMENT WITH ULTRASOUND AND FLUOROSCOPIC GUIDANCE FLUOROSCOPY TIME:  2.4 minutes, 12.1 mGy PROCEDURE: The patient was advised of the possible risks and complications and agreed to undergo the procedure. The patient was then brought to the angiographic suite for the procedure. The left arm was prepped with chlorhexidine, draped in the usual sterile fashion using maximum barrier technique (cap and mask, sterile gown, sterile gloves, large sterile sheet, hand hygiene and cutaneous antisepsis) and infiltrated locally with 1% Lidocaine. Ultrasound demonstrated patency of the leftBrachial vein, and  this was documented with an image. Under real-time ultrasound guidance, this vein  was accessed with a 21 gauge micropuncture needle and image documentation was performed. A 0.018 wire was introduced in to the vein. Over this, a 5 [place] double lumen power PICC was advanced to the lower SVC/right atrial junction. Fluoroscopy during the procedure and fluoro spot radiograph confirms appropriate catheter position. The catheter was flushed and covered with a sterile dressing. Catheter length: 40 Complications: None immediate IMPRESSION: Successful left arm power PICC line placement with ultrasound and fluoroscopic guidance. The catheter is ready for use. Electronically Signed   By: Jerilynn Mages.  Shick M.D.   On: 06/03/2015 14:27   Ir US Guide Vasc Access Left  06/03/2015  CLINICAL DATA:  Sepsis EXAM: POWER PICC LINE PLACEMENT WITH ULTRASOUND AND FLUOROSCOPIC GUIDANCE FLUOROSCOPY TIME:  2.4 minutes, 12.1 mGy PROCEDURE: The patient was advised of the possible risks and complications and agreed to undergo the procedure. The patient was then brought to the angiographic suite for the procedure. The left arm was prepped with chlorhexidine, draped in the usual sterile fashion using maximum barrier technique (cap and mask, sterile gown, sterile gloves, large sterile sheet, hand hygiene and cutaneous antisepsis) and infiltrated locally with 1% Lidocaine. Ultrasound demonstrated patency of the leftBrachial vein, and this was documented with an image. Under real-time ultrasound guidance, this vein was accessed with a 21 gauge micropuncture needle and image documentation was performed. A 0.018 wire was introduced in to the vein. Over this, a 5 [place] double lumen power PICC was advanced to the lower SVC/right atrial junction. Fluoroscopy during the procedure and fluoro spot radiograph confirms appropriate catheter position. The catheter was flushed and covered with a sterile dressing. Catheter length: 40 Complications: None immediate IMPRESSION: Successful left arm power PICC line placement with ultrasound and  fluoroscopic guidance. The catheter is ready for use. Electronically Signed   By: Jerilynn Mages.  Shick M.D.   On: 06/03/2015 14:27    Scheduled Meds: . baclofen  20 mg Oral QID  . collagenase   Topical Daily  . darifenacin  15 mg Oral Daily  . docusate sodium  200 mg Oral BID  . enoxaparin (LOVENOX) injection  40 mg Subcutaneous Q24H  . ferrous sulfate  325 mg Oral TID WC  . lactose free nutrition  237 mL Oral TID WC  . lidocaine      . magnesium oxide  400 mg Oral Daily  . magnesium sulfate 1 - 4 g bolus IVPB  2 g Intravenous Once  . metoCLOPramide  10 mg Oral TID AC  . mirabegron ER  25 mg Oral Daily  . multivitamin with minerals  1 tablet Oral q morning - 10a  . pantoprazole  40 mg Oral BID AC  . piperacillin-tazobactam (ZOSYN)  IV  3.375 g Intravenous Q8H  . potassium chloride  40 mEq Oral TID  . protein supplement  8 oz Oral BID BM  . sodium chloride  500 mL Intravenous Once  . sodium chloride  3 mL Intravenous Q12H  . sucralfate  1 g Oral QID  . vitamin C  500 mg Oral q morning - 10a  . zinc sulfate  220 mg Oral BID   Continuous Infusions:    Time spent: 25 minutes    Xylan Sheils  [facility] Pager [phone]. If 7PM-7AM, please contact night-coverage at [email or web address], password [facility] 06/03/2015, 9:37 PM  LOS: 5 days

## 2015-06-04 DIAGNOSIS — A4189 Other specified sepsis: Secondary | ICD-10-CM

## 2015-06-04 LAB — COMPREHENSIVE METABOLIC PANEL
ALK PHOS: 105 U/L (ref 38–126)
ALT: 35 U/L (ref 17–63)
ANION GAP: 8 (ref 5–15)
AST: 27 U/L (ref 15–41)
Albumin: 2.1 g/dL — ABNORMAL LOW (ref 3.5–5.0)
BILIRUBIN TOTAL: 0.2 mg/dL — AB (ref 0.3–1.2)
BUN: 18 mg/dL (ref 6–20)
CALCIUM: 8.6 mg/dL — AB (ref 8.9–10.3)
CO2: 26 mmol/L (ref 22–32)
Chloride: 110 mmol/L (ref 101–111)
Creatinine, Ser: 0.55 mg/dL — ABNORMAL LOW (ref 0.61–1.24)
Glucose, Bld: 96 mg/dL (ref 65–99)
Potassium: 3.2 mmol/L — ABNORMAL LOW (ref 3.5–5.1)
Sodium: 144 mmol/L (ref 135–145)
TOTAL PROTEIN: 5.8 g/dL — AB (ref 6.5–8.1)

## 2015-06-04 LAB — CBC WITH DIFFERENTIAL/PLATELET
Basophils Absolute: 0 10*3/uL (ref 0.0–0.1)
Basophils Relative: 0 %
Eosinophils Absolute: 0.3 10*3/uL (ref 0.0–0.7)
Eosinophils Relative: 2 %
HEMATOCRIT: 25.2 % — AB (ref 39.0–52.0)
HEMOGLOBIN: 7.7 g/dL — AB (ref 13.0–17.0)
LYMPHS ABS: 2.7 10*3/uL (ref 0.7–4.0)
Lymphocytes Relative: 15 %
MCH: 24.8 pg — AB (ref 26.0–34.0)
MCHC: 30.6 g/dL (ref 30.0–36.0)
MCV: 81 fL (ref 78.0–100.0)
MONO ABS: 1.9 10*3/uL — AB (ref 0.1–1.0)
MONOS PCT: 10 %
NEUTROS ABS: 13.5 10*3/uL — AB (ref 1.7–7.7)
NEUTROS PCT: 73 %
Platelets: 460 10*3/uL — ABNORMAL HIGH (ref 150–400)
RBC: 3.11 MIL/uL — ABNORMAL LOW (ref 4.22–5.81)
RDW: 19.1 % — ABNORMAL HIGH (ref 11.5–15.5)
WBC: 18.4 10*3/uL — ABNORMAL HIGH (ref 4.0–10.5)

## 2015-06-04 LAB — MAGNESIUM: Magnesium: 2.1 mg/dL (ref 1.7–2.4)

## 2015-06-04 MED ORDER — POTASSIUM CHLORIDE 20 MEQ/15ML (10%) PO SOLN
40.0000 meq | ORAL | Status: AC
  Administered 2015-06-04 – 2015-06-05 (×2): 40 meq via ORAL
  Filled 2015-06-04 (×2): qty 30

## 2015-06-04 NOTE — Progress Notes (Signed)
Pharmacy Antibiotic Follow-up Note  [name] [name] is a 50 y.o. year-old male admitted on 05/28/2015.  The patient is currently on day #7 of 27 of zosyn for providencia bacteremia.  ID has recommended 14-day course of zosyn.  Assessment/Plan:  Continue zosyn 3.375gm IV q8h over 4h infusion.  Can be changed to 3.375gm IV q6h over 39min infusion at discharge.    Dose adjustment not likely, pharmacy will sign off at this time.   Temp (24hrs), Avg:98.2 F (36.8 C), Min:97.8 F (36.6 C), Max:98.4 F (36.9 C)   Recent Labs Lab 05/31/15 0115 06/01/15 0410 06/02/15 0308 06/03/15 0404 06/04/15 0330  WBC 19.9* 17.5* 18.2* 23.2* 18.4*    Recent Labs Lab 05/31/15 0115 06/01/15 0410 06/02/15 0308 06/03/15 0404 06/04/15 0330  CREATININE 0.58* 0.46* 0.49* 0.46* 0.55*   Estimated Creatinine Clearance: 123.9 mL/min (by C-G formula based on Cr of 0.55).    Allergies  Allergen Reactions  . Ditropan [Oxybutynin] Other (See Comments)    hallucinations    1/13 >> zosyn  >> 1/13 >> vancomycin  >>  1/15 1/13 >> levaquin >> 1/13  1/13 blood: 1 of 2 sets growing Providencia stuartii, S to Zosyn (and cefepime, ceftriaxone among others) 1/13 urine: suggest recollection, multiple species 1/13 cdiff neg/neg 1/15 MRSA PCR nasal screen: negative 1/13 MRSA nasal culture: negative 1/15 sacral wound cx: multiple organisms (non-predominant) 1/15 urine: 60K pseudomonas (S cefepime, ceftaz, genet, pip/tazo), 60K providencia (S ceftriaxone, imi, pip/tazo)  Thank you for allowing pharmacy to be a part of this patient's care.  Doreene Eland, PharmD, BCPS.   Pager: [phone] 06/04/2015 2:54 PM

## 2015-06-04 NOTE — Progress Notes (Signed)
[name] for Infectious Disease    Date of Admission:  05/28/2015   Total days of antibiotics 8        Day 8 piptazo           ID: [name] [name] is a 50 y.o. male with incomplete quadriplegia with chronic folly, and  Chronic decub ulses and osteo presenting with sepsis found to have providencia bacteremia Principal Problem:   Gram-negative bacteremia ([place]) Active Problems:   Quadriplegia ([place])   Leukocytosis   Sacral decubitus ulcer, stage IV ([name]   Complicated UTI (urinary tract infection)   OSA on CPAP   Chronic osteomyelitis, pelvic region and thigh ([place])   Decubitus ulcer of lower extremity, stage 2   Pressure ulcer of right upper back   History of MDR Pseudomonas aeruginosa infection   Anemia of chronic disease   Sepsis ([place])   Septic shock ([place])   Palliative care encounter   Infection, Pseudomonas    Subjective: Afebrile, feeling better.   Interval hx: he has new left arm picc line  Medications:  . baclofen  20 mg Oral QID  . collagenase   Topical Daily  . darifenacin  15 mg Oral Daily  . docusate sodium  200 mg Oral BID  . enoxaparin (LOVENOX) injection  40 mg Subcutaneous Q24H  . ferrous sulfate  325 mg Oral TID WC  . lactose free nutrition  237 mL Oral TID WC  . magnesium oxide  400 mg Oral Daily  . metoCLOPramide  10 mg Oral TID AC  . mirabegron ER  25 mg Oral Daily  . multivitamin with minerals  1 tablet Oral q morning - 10a  . pantoprazole  40 mg Oral BID AC  . piperacillin-tazobactam (ZOSYN)  IV  3.375 g Intravenous Q8H  . potassium chloride  40 mEq Oral TID  . protein supplement  8 oz Oral BID BM  . sodium chloride  500 mL Intravenous Once  . sodium chloride  3 mL Intravenous Q12H  . sucralfate  1 g Oral QID  . vitamin C  500 mg Oral q morning - 10a  . zinc sulfate  220 mg Oral BID    Objective: Vital signs in last 24 hours: Temp:  [98.2 F (36.8 C)-98.4 F (36.9 C)] 98.2 F (36.8 C) (01/19 1251) Pulse Rate:  [86-101] 86 (01/19  1251) Resp:  [18-20] 18 (01/19 1251) BP: (126-130)/(73-85) 130/80 mmHg (01/19 1251) SpO2:  [98 %-100 %] 100 % (01/19 1251) Physical Exam  Constitutional: He is oriented to person, place, and time. He appears well-developed and well-nourished. No distress. Listening on headphones HENT:  Mouth/Throat: Oropharynx is clear and moist. No oropharyngeal exudate.  Cardiovascular: Normal rate, regular rhythm and normal heart sounds. Exam reveals no gallop and no friction rub.  No murmur heard.  Pulmonary/Chest: Effort normal and breath sounds normal. No respiratory distress. He has no wheezes.  Neurological: flaccid paralysis except partial movement to left arm Skin:i  did not examine wounds today Psychiatric: He has a normal mood and affect. His behavior is normal.     Lab Results  Recent Labs  06/03/15 0404 06/04/15 0330  WBC 23.2* 18.4*  HGB 8.0* 7.7*  HCT 26.0* 25.2*  NA 142 144  K 2.9* 3.2*  CL 110 110  CO2 28 26  BUN 13 18  CREATININE 0.46* 0.55*    Microbiology: Ur cx: 60,000 providencia nad 60,000 PsA Blood cx 1/2 on 1/13 + providencia S cefepime, ceftaz, pip/tazo,  imi, ctx. R cefazolin, R FQ, R sulfa Wound cx: multiple organism  Assessment/Plan: Providencia bacteremia likely 2/2 chronic foley =  Continue with piptazo x 14 days using 1/13 as day 1 of 14.   Chronic wounds/decub ulcers = continue with current wound care plan  [facility] Cell: [phone] Pager: [phone]  06/04/2015, 4:59 PM

## 2015-06-04 NOTE — Progress Notes (Signed)
Pt wants CPAP at around midnight.  

## 2015-06-04 NOTE — Progress Notes (Signed)
[name] [name] [id number] DOB: [birth year] DOA: 05/28/2015 PCP: [name] Greenland, MD  Summary&Daily Progress Notes 06/03/15: I have seen and examined [name] [name] at bedside and reviewed his chart. Appreciate ID/palliative care. Patient with quadriplegia being treated for sepsis related to Providencia, also has pseudomonas in urine culture, chronic osteomyelitis, etc. He has worsening leukocytosis despite antibiotics and has been referred to palliative care. Central line was removed today and this may have been source of infection. The concern is that his white count has increased to 23,000 today. We'll continue current antibiotics, follow white count and follow infectious diseases in a.m. patient being considered for LTAC 06/04/15: Appreciate ID/palliative care. White count appears to be improving 23.000>18.000. Potassium remains low at 3.2. Await decision regarding LTAC placement. We will continue current antibiotics. ID recommendations, replenish electrolytes as necessary and plan eventual discharge to LTAC/home with home health services. Problem List Plan  Principal Problem:   Gram-negative bacteremia ([place]) Active Problems:   Quadriplegia ([name]   Leukocytosis   Sacral decubitus ulcer, stage IV ([name]   Complicated UTI (urinary tract infection)   OSA on CPAP   Chronic osteomyelitis, pelvic region and thigh ([name]   Decubitus ulcer of lower extremity, stage 2   Pressure ulcer of right upper back   History of MDR Pseudomonas aeruginosa infection   Anemia of chronic disease   Sepsis ([name]   Septic shock ([name]   Palliative care encounter   Infection, Pseudomonas   Replenish potassium/magnesium as needed  Continue antibiotics per ID  Follow case management regarding placement   Code Status: Full Code Family Communication: Family member over the phone on 06/03/15 Disposition Plan: Ltach or home   Consultants:  Palliative care  ID  Procedures:    Antibiotics:  Per  ID  HPI/Subjective: No complaints today.  Objective: Filed Vitals:   06/04/15 0451 06/04/15 1251  BP: 126/85 130/80  Pulse: 87 86  Temp: 98.3 F (36.8 C) 98.2 F (36.8 C)  Resp: 18 18    Intake/Output Summary (Last 24 hours) at 06/04/15 2000 Last data filed at 06/04/15 1259  Gross per 24 hour  Intake   1110 ml  Output   3150 ml  Net  -2040 ml   Filed Weights   05/28/15 1951 05/29/15 1623  Weight: 91.627 kg (202 lb) 91 kg (200 lb 9.9 oz)    Exam:   General:  Comfortable at rest.  Cardiovascular: S1-S2 normal. No murmurs. Pulse regular.  Respiratory: Good air entry bilaterally. No rhonchi or rales.  Abdomen: Soft and nontender. Normal bowel sounds. No organomegaly.  Musculoskeletal: No pedal edema   Neurological: Intact  Data Reviewed: Basic Metabolic Panel:  Recent Labs Lab 05/30/15 0410 05/31/15 0115 06/01/15 0410 06/02/15 0308 06/03/15 0404 06/04/15 0330  NA 143 141 143 146* 142 144  K 3.0* 3.1* 2.9* 3.1* 2.9* 3.2*  CL 112* 111 110 113* 110 110  CO2 24 22 24 26 28 26   GLUCOSE 146* 178* 131* 89 108* 96  BUN 17 14 13 14 13 18   CREATININE 0.46* 0.58* 0.46* 0.49* 0.46* 0.55*  CALCIUM 8.3* 8.5* 8.4* 8.6* 8.4* 8.6*  MG 1.6*  --  1.6* 1.9  --  2.1   Liver Function Tests:  Recent Labs Lab 05/28/15 2255 05/29/15 1710 06/04/15 0330  AST 21 30 27   ALT 17 25 35  ALKPHOS 77 71 105  BILITOT 0.5 0.8 0.2*  PROT 7.6 6.2* 5.8*  ALBUMIN 2.6* 2.2* 2.1*    Recent Labs Lab 05/28/15 2255  LIPASE 19   No results for input(s): AMMONIA in the last 168 hours. CBC:  Recent Labs Lab 05/28/15 2255  05/31/15 0115 06/01/15 0410 06/02/15 0308 06/03/15 0404 06/04/15 0330  WBC 29.9*  < > 19.9* 17.5* 18.2* 23.2* 18.4*  NEUTROABS 25.7*  --   --   --   --   --  13.5*  HGB 9.3*  < > 8.0* 8.0* 8.0* 8.0* 7.7*  HCT 29.7*  < > 26.8* 26.7* 26.8* 26.0* 25.2*  MCV 78.0  < > 81.5 81.4 80.7 80.0 81.0  PLT 579*  < > 444* 463* 476* 471* 460*  < > = values in this  interval not displayed. Cardiac Enzymes:  Recent Labs Lab 05/29/15 1710  TROPONINI <0.03   BNP (last 3 results) No results for input(s): BNP in the last 8760 hours.  ProBNP (last 3 results) No results for input(s): PROBNP in the last 8760 hours.  CBG:  Recent Labs Lab 05/30/15 0721  GLUCAP 123*    Recent Results (from the past 240 hour(s))  Urine culture     Status: None   Collection Time: 05/28/15  8:39 PM  Result Value Ref Range Status   Specimen Description URINE, CATHETERIZED  Final   Special Requests NONE  Final   Culture   Final    MULTIPLE SPECIES PRESENT, SUGGEST RECOLLECTION Performed at [facility]    Report Status 05/30/2015 FINAL  Final  Blood Culture (routine x 2)     Status: None   Collection Time: 05/28/15 10:55 PM  Result Value Ref Range Status   Specimen Description BLOOD RIGHT FEMORAL ARTERY  Final   Special Requests BOTTLES DRAWN AEROBIC AND ANAEROBIC 5CC  Final   Culture  Setup Time   Final    GRAM NEGATIVE RODS IN BOTH AEROBIC AND ANAEROBIC BOTTLES CRITICAL RESULT CALLED TO, READ BACK BY AND VERIFIED WITHCleotilde Neer RN 1911 05/29/15 A BR[place]    Culture   Final    PROVIDENCIA STUARTII Performed at [facility]    Report Status 06/01/2015 FINAL  Final   Organism ID, Bacteria PROVIDENCIA STUARTII  Final      Susceptibility   Providencia stuartii - MIC*    AMPICILLIN >=32 RESISTANT Resistant     CEFAZOLIN >=64 RESISTANT Resistant     CEFEPIME <=1 SENSITIVE Sensitive     CEFTAZIDIME <=1 SENSITIVE Sensitive     CEFTRIAXONE <=1 SENSITIVE Sensitive     CIPROFLOXACIN >=4 RESISTANT Resistant     GENTAMICIN 8 RESISTANT Resistant     IMIPENEM 2 SENSITIVE Sensitive     TRIMETH/SULFA >=320 RESISTANT Resistant     AMPICILLIN/SULBACTAM >=32 RESISTANT Resistant     PIP/TAZO <=4 SENSITIVE Sensitive     * PROVIDENCIA STUARTII  Blood Culture (routine x 2)     Status: None   Collection Time: 05/29/15  6:00 AM  Result Value Ref Range  Status   Specimen Description BLOOD CENTRAL LINE  Final   Special Requests BOTTLES DRAWN AEROBIC AND ANAEROBIC 5ML  Final   Culture   Final    NO GROWTH 5 DAYS Performed at [facility]    Report Status 06/03/2015 FINAL  Final  MRSA PCR Screening     Status: Abnormal   Collection Time: 05/29/15  4:29 PM  Result Value Ref Range Status   MRSA by PCR INVALID RESULTS, SPECIMEN SENT FOR CULTURE (A) NEGATIVE Final    Comment: [place] [id number] @ [id number] BY J SCOTTON  The GeneXpert MRSA Assay (FDA approved for NASAL specimens only), is one component of a comprehensive MRSA colonization surveillance program. It is not intended to diagnose MRSA infection nor to guide or monitor treatment for MRSA infections.   MRSA culture     Status: None   Collection Time: 05/29/15  4:30 PM  Result Value Ref Range Status   Specimen Description NOSE  Final   Special Requests NONE  Final   Culture NOMRSA Performed at [facility]   Final   Report Status 05/31/2015 FINAL  Final  C difficile quick scan w PCR reflex     Status: None   Collection Time: 05/29/15  6:49 PM  Result Value Ref Range Status   C Diff antigen NEGATIVE NEGATIVE Final   C Diff toxin NEGATIVE NEGATIVE Final   C Diff interpretation Negative for toxigenic C. difficile  Final  Wound culture     Status: None   Collection Time: 05/31/15  5:02 AM  Result Value Ref Range Status   Specimen Description SACRAL  Final   Special Requests NONE  Final   Gram Stain   Final    FEW WBC NO SQUAMOUS EPITHELIAL CELLS SEEN FEW GRAM NEGATIVE RODS Performed at [facility]    Culture   Final    MULTIPLE ORGANISMS PRESENT, NONE PREDOMINANT Note: NO STAPHYLOCOCCUS AUREUS ISOLATED NO GROUP A STREP (S.PYOGENES) ISOLATED Performed at [facility]    Report Status 06/02/2015 FINAL  Final  Urine culture     Status: None   Collection Time: 05/31/15  5:02 AM  Result Value Ref Range Status   Specimen Description  URINE, CATHETERIZED  Final   Special Requests NONE  Final   Culture   Final    60,000 COLONIES/ml PSEUDOMONAS AERUGINOSA 60,000 COLONIES/ml PROVIDENCIA STUARTII Performed at [facility]    Report Status 06/03/2015 FINAL  Final   Organism ID, Bacteria PSEUDOMONAS AERUGINOSA  Final   Organism ID, Bacteria PROVIDENCIA STUARTII  Final      Susceptibility   Pseudomonas aeruginosa - MIC*    CEFTAZIDIME 4 SENSITIVE Sensitive     CIPROFLOXACIN 2 INTERMEDIATE Intermediate     GENTAMICIN <=1 SENSITIVE Sensitive     IMIPENEM >=16 RESISTANT Resistant     PIP/TAZO 16 SENSITIVE Sensitive     CEFEPIME 8 SENSITIVE Sensitive     * 60,000 COLONIES/ml PSEUDOMONAS AERUGINOSA   Providencia stuartii - MIC*    AMPICILLIN >=32 RESISTANT Resistant     CEFAZOLIN >=64 RESISTANT Resistant     CEFTRIAXONE <=1 SENSITIVE Sensitive     CIPROFLOXACIN >=4 RESISTANT Resistant     GENTAMICIN 8 RESISTANT Resistant     IMIPENEM 2 SENSITIVE Sensitive     NITROFURANTOIN 128 RESISTANT Resistant     TRIMETH/SULFA >=320 RESISTANT Resistant     AMPICILLIN/SULBACTAM >=32 RESISTANT Resistant     PIP/TAZO <=4 SENSITIVE Sensitive     * 60,000 COLONIES/ml PROVIDENCIA STUARTII  MRSA PCR Screening     Status: None   Collection Time: 05/31/15 12:05 PM  Result Value Ref Range Status   MRSA by PCR NEGATIVE NEGATIVE Final    Comment:        The GeneXpert MRSA Assay (FDA approved for NASAL specimens only), is one component of a comprehensive MRSA colonization surveillance program. It is not intended to diagnose MRSA infection nor to guide or monitor treatment for MRSA infections. DELTA CHECK NOTED      Studies: Ir Fluoro Guide Cv Line Left  06/03/2015  CLINICAL DATA:  Sepsis EXAM: POWER PICC LINE PLACEMENT WITH ULTRASOUND AND FLUOROSCOPIC GUIDANCE FLUOROSCOPY TIME:  2.4 minutes, 12.1 mGy PROCEDURE: The patient was advised of the possible risks and complications and agreed to undergo the procedure. The patient was  then brought to the angiographic suite for the procedure. The left arm was prepped with chlorhexidine, draped in the usual sterile fashion using maximum barrier technique (cap and mask, sterile gown, sterile gloves, large sterile sheet, hand hygiene and cutaneous antisepsis) and infiltrated locally with 1% Lidocaine. Ultrasound demonstrated patency of the leftBrachial vein, and this was documented with an image. Under real-time ultrasound guidance, this vein was accessed with a 21 gauge micropuncture needle and image documentation was performed. A 0.018 wire was introduced in to the vein. Over this, a 5 [place] double lumen power PICC was advanced to the lower SVC/right atrial junction. Fluoroscopy during the procedure and fluoro spot radiograph confirms appropriate catheter position. The catheter was flushed and covered with a sterile dressing. Catheter length: 40 Complications: None immediate IMPRESSION: Successful left arm power PICC line placement with ultrasound and fluoroscopic guidance. The catheter is ready for use. Electronically Signed   By: Jerilynn Mages.  Shick M.D.   On: 06/03/2015 14:27   Ir US Guide Vasc Access Left  06/03/2015  CLINICAL DATA:  Sepsis EXAM: POWER PICC LINE PLACEMENT WITH ULTRASOUND AND FLUOROSCOPIC GUIDANCE FLUOROSCOPY TIME:  2.4 minutes, 12.1 mGy PROCEDURE: The patient was advised of the possible risks and complications and agreed to undergo the procedure. The patient was then brought to the angiographic suite for the procedure. The left arm was prepped with chlorhexidine, draped in the usual sterile fashion using maximum barrier technique (cap and mask, sterile gown, sterile gloves, large sterile sheet, hand hygiene and cutaneous antisepsis) and infiltrated locally with 1% Lidocaine. Ultrasound demonstrated patency of the leftBrachial vein, and this was documented with an image. Under real-time ultrasound guidance, this vein was accessed with a 21 gauge micropuncture needle and image  documentation was performed. A 0.018 wire was introduced in to the vein. Over this, a 5 [place] double lumen power PICC was advanced to the lower SVC/right atrial junction. Fluoroscopy during the procedure and fluoro spot radiograph confirms appropriate catheter position. The catheter was flushed and covered with a sterile dressing. Catheter length: 40 Complications: None immediate IMPRESSION: Successful left arm power PICC line placement with ultrasound and fluoroscopic guidance. The catheter is ready for use. Electronically Signed   By: Jerilynn Mages.  Shick M.D.   On: 06/03/2015 14:27    Scheduled Meds: . baclofen  20 mg Oral QID  . collagenase   Topical Daily  . darifenacin  15 mg Oral Daily  . docusate sodium  200 mg Oral BID  . enoxaparin (LOVENOX) injection  40 mg Subcutaneous Q24H  . ferrous sulfate  325 mg Oral TID WC  . lactose free nutrition  237 mL Oral TID WC  . magnesium oxide  400 mg Oral Daily  . metoCLOPramide  10 mg Oral TID AC  . mirabegron ER  25 mg Oral Daily  . multivitamin with minerals  1 tablet Oral q morning - 10a  . pantoprazole  40 mg Oral BID AC  . piperacillin-tazobactam (ZOSYN)  IV  3.375 g Intravenous Q8H  . potassium chloride  40 mEq Oral Q4H  . protein supplement  8 oz Oral BID BM  . sodium chloride  500 mL Intravenous Once  . sodium chloride  3 mL Intravenous Q12H  . sucralfate  1 g Oral  QID  . vitamin C  500 mg Oral q morning - 10a  . zinc sulfate  220 mg Oral BID   Continuous Infusions:    Time spent: 25 minutes    [facility] Pager [phone]. If 7PM-7AM, please contact night-coverage at [email or web address], password [facility] 06/04/2015, 8:00 PM  LOS: 6 days

## 2015-06-05 LAB — CBC WITH DIFFERENTIAL/PLATELET
BASOS PCT: 0 %
Basophils Absolute: 0 10*3/uL (ref 0.0–0.1)
EOS ABS: 0.6 10*3/uL (ref 0.0–0.7)
EOS PCT: 3 %
HCT: 25.9 % — ABNORMAL LOW (ref 39.0–52.0)
Hemoglobin: 8 g/dL — ABNORMAL LOW (ref 13.0–17.0)
LYMPHS ABS: 2.3 10*3/uL (ref 0.7–4.0)
Lymphocytes Relative: 12 %
MCH: 24.3 pg — AB (ref 26.0–34.0)
MCHC: 30.9 g/dL (ref 30.0–36.0)
MCV: 78.7 fL (ref 78.0–100.0)
MONOS PCT: 12 %
Monocytes Absolute: 2.2 10*3/uL — ABNORMAL HIGH (ref 0.1–1.0)
Neutro Abs: 13.8 10*3/uL — ABNORMAL HIGH (ref 1.7–7.7)
Neutrophils Relative %: 73 %
Platelets: 485 10*3/uL — ABNORMAL HIGH (ref 150–400)
RBC: 3.29 MIL/uL — ABNORMAL LOW (ref 4.22–5.81)
RDW: 19.7 % — ABNORMAL HIGH (ref 11.5–15.5)
WBC: 18.9 10*3/uL — AB (ref 4.0–10.5)

## 2015-06-05 LAB — COMPREHENSIVE METABOLIC PANEL
ALK PHOS: 103 U/L (ref 38–126)
ALT: 35 U/L (ref 17–63)
AST: 26 U/L (ref 15–41)
Albumin: 2.2 g/dL — ABNORMAL LOW (ref 3.5–5.0)
Anion gap: 7 (ref 5–15)
BUN: 17 mg/dL (ref 6–20)
CALCIUM: 8.3 mg/dL — AB (ref 8.9–10.3)
CO2: 27 mmol/L (ref 22–32)
CREATININE: 0.47 mg/dL — AB (ref 0.61–1.24)
Chloride: 107 mmol/L (ref 101–111)
Glucose, Bld: 111 mg/dL — ABNORMAL HIGH (ref 65–99)
Potassium: 4.5 mmol/L (ref 3.5–5.1)
Sodium: 141 mmol/L (ref 135–145)
Total Bilirubin: 0.4 mg/dL (ref 0.3–1.2)
Total Protein: 6.1 g/dL — ABNORMAL LOW (ref 6.5–8.1)

## 2015-06-05 NOTE — Care Management Note (Signed)
Case Management Note  Patient Details  Name: [name] [name] MRN: [id number] Date of Birth: 10-18-1965  Subjective/Objective: [facility] already following for [facility] specific orders:see treatment sticky note-HHRN-wound care orders.HHRN iv abx(need script-med-dose,freq,duration, if labs,picc flush)                   Action/Plan:d/c home w/HHC-HHRN-wound care, & iv abx   Expected Discharge Date:                  Expected Discharge Plan:  [place]  In-House Referral:     Discharge planning Services  CM Consult  Post Acute Care Choice:  Home Health [facility] active-HHRN-wound care) Choice offered to:  Patient  DME Arranged:    DME Agency:     HH Arranged:  RN [place] Agency:  [place]  Status of Service:  In process, will continue to follow  Medicare Important Message Given:  Yes Date Medicare IM Given:    Medicare IM give by:    Date Additional Medicare IM Given:    Additional Medicare Important Message give by:     If discussed at [place] of Stay Meetings, dates discussed:    Additional Comments:  Dessa Phi, RN 06/05/2015, 3:24 PM

## 2015-06-05 NOTE — Care Management Note (Signed)
Case Management Note  Patient Details  Name: [name] [name] MRN: [id number] Date of Birth: Nov 27, 1965  Subjective/Objective:  [name] [name] from [place] for [address] be managed @ SNF or home per [employer] rep [place].  Patient informed & agree to home w/HHC-[facility] already following rep Karen-HHRN-specific wound care orders, [name]-[facility] iv therapy liason aware & following for iv abx manual script, picc flush/labs per protocal.  Await d/c order.                  Action/Plan:d/c home w/HHC/iv abx.   Expected Discharge Date:                  Expected Discharge Plan:  [place]  In-House Referral:     Discharge planning Services  CM Consult  Post Acute Care Choice:    Choice offered to:  Patient  DME Arranged:    DME Agency:     HH Arranged:  RN, PT [place] Agency:  [place]  Status of Service:  In process, will continue to follow  Medicare Important Message Given:  Yes Date Medicare IM Given:    Medicare IM give by:    Date Additional Medicare IM Given:    Additional Medicare Important Message give by:     If discussed at [place] of Stay Meetings, dates discussed:    Additional Comments:  [name] Phi, RN 06/05/2015, 12:44 PM

## 2015-06-05 NOTE — Care Management Important Message (Signed)
Important Message  Patient Details IM Letter given to [name]/Case Manager to present to Patient Name: [name] [name] MRN: [id number] Date of Birth: 14-Mar-1966   Medicare Important Message Given:  Yes    [name] 06/05/2015, 11:43 AMImportant Message  Patient Details  Name: [name] [name] MRN: [id number] Date of Birth: [birth year]   Medicare Important Message Given:  Yes    [name] 06/05/2015, 11:43 AM

## 2015-06-05 NOTE — Progress Notes (Signed)
[name] [name] [id number] DOB: 1965/09/09 DOA: 05/28/2015 PCP: [name] Greenland, MD  Summary&Daily Progress Notes 06/03/15: I have seen and examined [name] [name] at bedside and reviewed his chart. Appreciate ID/palliative care. Patient with quadriplegia being treated for sepsis related to Providencia, also has pseudomonas in urine culture, chronic osteomyelitis, etc. He has worsening leukocytosis despite antibiotics and has been referred to palliative care. Central line was removed today and this may have been source of infection. The concern is that his white count has increased to 23,000 today. We'll continue current antibiotics, follow white count and follow infectious diseases in [name].m. patient being considered for LTAC 06/04/15: Appreciate ID/palliative care. White count appears to be improving 23.000>18.000. Potassium remains low at 3.2. Await decision regarding LTAC placement. We will continue current antibiotics. ID recommendations, replenish electrolytes as necessary and plan eventual discharge to LTAC/home with home health services. 06/05/15: Appreciate ID. Patient with low grade fever and persistently elevated white count 18.900. This is concerning. Potassium normal. Patient felt lowsy in am. He should complete 14 days of antibiotics per ID. Turned down by [facility], hence plan for home with iv antibiotics/hhs. Problem List Plan  Principal Problem:   Gram-negative bacteremia ([place]) Active Problems:   Quadriplegia ([place])   Leukocytosis   Sacral decubitus ulcer, stage IV ([name]   Complicated UTI (urinary tract infection)   OSA on CPAP   Chronic osteomyelitis, pelvic region and thigh ([name]   Decubitus ulcer of lower extremity, stage 2   Pressure ulcer of right upper back   History of MDR Pseudomonas aeruginosa infection   Anemia of chronic disease   Sepsis ([place])   Septic shock ([place])   Palliative care encounter   Infection, Pseudomonas   Continue Zosyn to complete 2 weeks  Follow white count,  ?change antibiotics if increasing?  Follow case management regarding setting up intravenous antibiotics at home  Code Status: Full Code Family Communication: Family member over the phone on 06/03/15 Disposition Plan: Home early next week if evidence of control of infection.   Consultants:  Palliative care  ID  Procedures:    Antibiotics:  Per ID  HPI/Subjective: Felt lowsy this morning but better now.  Objective: Filed Vitals:   06/05/15 1253 06/05/15 2019  BP: 133/80 111/61  Pulse: 109   Temp: 98.3 F (36.8 C) 99 F (37.2 C)  Resp: 20 20    Intake/Output Summary (Last 24 hours) at 06/05/15 2327 Last data filed at 06/05/15 2325  Gross per 24 hour  Intake   2220 ml  Output   7451 ml  Net  -5231 ml   Filed Weights   05/28/15 1951 05/29/15 1623  Weight: 91.627 kg (202 lb) 91 kg (200 lb 9.9 oz)    Exam:   General:  Comfortable at rest.  Cardiovascular: S1-S2 normal. No murmurs. Pulse regular.  Respiratory: Good air entry bilaterally. No rhonchi or rales.  Abdomen: Soft and nontender. Normal bowel sounds. No organomegaly.  Musculoskeletal: No pedal edema   Neurological: No acute changes.  Data Reviewed: Basic Metabolic Panel:  Recent Labs Lab 05/30/15 0410  06/01/15 0410 06/02/15 0308 06/03/15 0404 06/04/15 0330 06/05/15 0420  NA 143  < > 143 146* 142 144 141  K 3.0*  < > 2.9* 3.1* 2.9* 3.2* 4.5  CL 112*  < > 110 113* 110 110 107  CO2 24  < > 24 26 28 26 27   GLUCOSE 146*  < > 131* 89 108* 96 111*  BUN 17  < > 13  14 13 18 17   CREATININE 0.46*  < > 0.46* 0.49* 0.46* 0.55* 0.47*  CALCIUM 8.3*  < > 8.4* 8.6* 8.4* 8.6* 8.3*  MG 1.6*  --  1.6* 1.9  --  2.1  --   < > = values in this interval not displayed. Liver Function Tests:  Recent Labs Lab 06/04/15 0330 06/05/15 0420  AST 27 26  ALT 35 35  ALKPHOS 105 103  BILITOT 0.2* 0.4  PROT 5.8* 6.1*  ALBUMIN 2.1* 2.2*   No results for input(s): LIPASE, AMYLASE in the last 168 hours. No  results for input(s): AMMONIA in the last 168 hours. CBC:  Recent Labs Lab 06/01/15 0410 06/02/15 0308 06/03/15 0404 06/04/15 0330 06/05/15 0420  WBC 17.5* 18.2* 23.2* 18.4* 18.9*  NEUTROABS  --   --   --  13.5* 13.8*  HGB 8.0* 8.0* 8.0* 7.7* 8.0*  HCT 26.7* 26.8* 26.0* 25.2* 25.9*  MCV 81.4 80.7 80.0 81.0 78.7  PLT 463* 476* 471* 460* 485*   Cardiac Enzymes: No results for input(s): CKTOTAL, CKMB, CKMBINDEX, TROPONINI in the last 168 hours. BNP (last 3 results) No results for input(s): BNP in the last 8760 hours.  ProBNP (last 3 results) No results for input(s): PROBNP in the last 8760 hours.  CBG:  Recent Labs Lab 05/30/15 0721  GLUCAP 123*    Recent Results (from the past 240 hour(s))  Urine culture     Status: None   Collection Time: 05/28/15  8:39 PM  Result Value Ref Range Status   Specimen Description URINE, CATHETERIZED  Final   Special Requests NONE  Final   Culture   Final    MULTIPLE SPECIES PRESENT, SUGGEST RECOLLECTION Performed at [facility]    Report Status 05/30/2015 FINAL  Final  Blood Culture (routine x 2)     Status: None   Collection Time: 05/28/15 10:55 PM  Result Value Ref Range Status   Specimen Description BLOOD RIGHT FEMORAL ARTERY  Final   Special Requests BOTTLES DRAWN AEROBIC AND ANAEROBIC 5CC  Final   Culture  Setup Time   Final    GRAM NEGATIVE RODS IN BOTH AEROBIC AND ANAEROBIC BOTTLES CRITICAL RESULT CALLED TO, READ BACK BY AND VERIFIED WITHCleotilde Neer RN 1911 05/29/15 [name] [name]    Culture   Final    PROVIDENCIA STUARTII Performed at [facility]    Report Status 06/01/2015 FINAL  Final   Organism ID, Bacteria PROVIDENCIA STUARTII  Final      Susceptibility   Providencia stuartii - MIC*    AMPICILLIN >=32 RESISTANT Resistant     CEFAZOLIN >=64 RESISTANT Resistant     CEFEPIME <=1 SENSITIVE Sensitive     CEFTAZIDIME <=1 SENSITIVE Sensitive     CEFTRIAXONE <=1 SENSITIVE Sensitive     CIPROFLOXACIN >=4  RESISTANT Resistant     GENTAMICIN 8 RESISTANT Resistant     IMIPENEM 2 SENSITIVE Sensitive     TRIMETH/SULFA >=320 RESISTANT Resistant     AMPICILLIN/SULBACTAM >=32 RESISTANT Resistant     PIP/TAZO <=4 SENSITIVE Sensitive     * PROVIDENCIA STUARTII  Blood Culture (routine x 2)     Status: None   Collection Time: 05/29/15  6:00 AM  Result Value Ref Range Status   Specimen Description BLOOD CENTRAL LINE  Final   Special Requests BOTTLES DRAWN AEROBIC AND ANAEROBIC 5ML  Final   Culture   Final    NO GROWTH 5 DAYS Performed at [facility]  Report Status 06/03/2015 FINAL  Final  MRSA PCR Screening     Status: Abnormal   Collection Time: 05/29/15  4:29 PM  Result Value Ref Range Status   MRSA by PCR INVALID RESULTS, SPECIMEN SENT FOR CULTURE ([name]) NEGATIVE Final    Comment: BRIE MCNABB,RN [id number] @ [id number] BY J SCOTTON        The GeneXpert MRSA Assay (FDA approved for NASAL specimens only), is one component of [name] comprehensive MRSA colonization surveillance program. It is not intended to diagnose MRSA infection nor to guide or monitor treatment for MRSA infections.   MRSA culture     Status: None   Collection Time: 05/29/15  4:30 PM  Result Value Ref Range Status   Specimen Description NOSE  Final   Special Requests NONE  Final   Culture NOMRSA Performed at [facility]   Final   Report Status 05/31/2015 FINAL  Final  C difficile quick scan w PCR reflex     Status: None   Collection Time: 05/29/15  6:49 PM  Result Value Ref Range Status   C Diff antigen NEGATIVE NEGATIVE Final   C Diff toxin NEGATIVE NEGATIVE Final   C Diff interpretation Negative for toxigenic C. difficile  Final  Wound culture     Status: None   Collection Time: 05/31/15  5:02 AM  Result Value Ref Range Status   Specimen Description SACRAL  Final   Special Requests NONE  Final   Gram Stain   Final    FEW WBC NO SQUAMOUS EPITHELIAL CELLS SEEN FEW GRAM NEGATIVE RODS Performed at [facility]    Culture   Final    MULTIPLE ORGANISMS PRESENT, NONE PREDOMINANT Note: NO STAPHYLOCOCCUS AUREUS ISOLATED NO GROUP [name] STREP (S.PYOGENES) ISOLATED Performed at [facility]    Report Status 06/02/2015 FINAL  Final  Urine culture     Status: None   Collection Time: 05/31/15  5:02 AM  Result Value Ref Range Status   Specimen Description URINE, CATHETERIZED  Final   Special Requests NONE  Final   Culture   Final    60,000 COLONIES/ml PSEUDOMONAS AERUGINOSA 60,000 COLONIES/ml PROVIDENCIA STUARTII Performed at [facility]    Report Status 06/03/2015 FINAL  Final   Organism ID, Bacteria PSEUDOMONAS AERUGINOSA  Final   Organism ID, Bacteria PROVIDENCIA STUARTII  Final      Susceptibility   Pseudomonas aeruginosa - MIC*    CEFTAZIDIME 4 SENSITIVE Sensitive     CIPROFLOXACIN 2 INTERMEDIATE Intermediate     GENTAMICIN <=1 SENSITIVE Sensitive     IMIPENEM >=16 RESISTANT Resistant     PIP/TAZO 16 SENSITIVE Sensitive     CEFEPIME 8 SENSITIVE Sensitive     * 60,000 COLONIES/ml PSEUDOMONAS AERUGINOSA   Providencia stuartii - MIC*    AMPICILLIN >=32 RESISTANT Resistant     CEFAZOLIN >=64 RESISTANT Resistant     CEFTRIAXONE <=1 SENSITIVE Sensitive     CIPROFLOXACIN >=4 RESISTANT Resistant     GENTAMICIN 8 RESISTANT Resistant     IMIPENEM 2 SENSITIVE Sensitive     NITROFURANTOIN 128 RESISTANT Resistant     TRIMETH/SULFA >=320 RESISTANT Resistant     AMPICILLIN/SULBACTAM >=32 RESISTANT Resistant     PIP/TAZO <=4 SENSITIVE Sensitive     * 60,000 COLONIES/ml PROVIDENCIA STUARTII  MRSA PCR Screening     Status: None   Collection Time: 05/31/15 12:05 PM  Result Value Ref Range Status   MRSA by PCR NEGATIVE NEGATIVE Final  Comment:        The GeneXpert MRSA Assay (FDA approved for NASAL specimens only), is one component of [name] comprehensive MRSA colonization surveillance program. It is not intended to diagnose MRSA infection nor to guide or monitor treatment  for MRSA infections. DELTA CHECK NOTED      Studies: No results found.  Scheduled Meds: . baclofen  20 mg Oral QID  . collagenase   Topical Daily  . darifenacin  15 mg Oral Daily  . docusate sodium  200 mg Oral BID  . enoxaparin (LOVENOX) injection  40 mg Subcutaneous Q24H  . ferrous sulfate  325 mg Oral TID WC  . lactose free nutrition  237 mL Oral TID WC  . magnesium oxide  400 mg Oral Daily  . metoCLOPramide  10 mg Oral TID AC  . mirabegron ER  25 mg Oral Daily  . multivitamin with minerals  1 tablet Oral q morning - 10a  . pantoprazole  40 mg Oral BID AC  . piperacillin-tazobactam (ZOSYN)  IV  3.375 g Intravenous Q8H  . protein supplement  8 oz Oral BID BM  . sodium chloride  500 mL Intravenous Once  . sodium chloride  3 mL Intravenous Q12H  . sucralfate  1 g Oral QID  . vitamin C  500 mg Oral q morning - 10a  . zinc sulfate  220 mg Oral BID   Continuous Infusions:    Time spent: 25 minutes    Tola Meas  [facility] Pager [phone]. If 7PM-7AM, please contact night-coverage at [email or web address], password [facility] 06/05/2015, 11:27 PM  LOS: 7 days

## 2015-06-05 NOTE — Progress Notes (Signed)
   06/05/15 1400  Subjective Assessment  Subjective I have had this before  Patient and Family Stated Goals agreed to wound care.  Prior Treatments HH  dressing changes  Evaluation and Treatment  Evaluation and Treatment Procedures Explained to Patient/Family Yes  Evaluation and Treatment Procedures agreed to  Hydrotherapy  Pulsed lavage therapy - wound location # 1 is R hip location  with eschar, #2 is L sacrum  Pulsed Lavage with Suction (psi) 8 psi  Pulsed Lavage with Suction - Normal Saline Used 1000 mL  Pulsed Lavage Tip Tip with splash shield  Wound Therapy - Assess/Plan/Recommendations  Wound Therapy - Clinical Statement pt with significant bleeding today, caution used with pls lavage  Wound Therapy - Functional Problem List quadriplegia of longstanding  Factors Delaying/Impairing Wound Healing Altered sensation;Incontinence;Infection - systemic/local;Immobility;Multiple medical problems  Hydrotherapy Plan Patient/family education;Pulsatile lavage with suction  Wound Therapy - Frequency 3X / week  Wound Therapy - Current Recommendations Case manager/social work  Wound Therapy - Follow Up Recommendations Skilled nursing facility  Wound Plan trial of PLS,  Wound Therapy Goals - Improve the function of patient's integumentary system by progressing the wound(s) through the phases of wound healing by:  Decrease Necrotic Tissue to 10  Decrease Necrotic Tissue - Progress Progressing toward goal  Increase Granulation Tissue to 90  Increase Granulation Tissue - Progress Progressing toward goal  Time For Goal Achievement 2 weeks  Wound Therapy - Potential for Goals Poor

## 2015-06-06 LAB — CBC WITH DIFFERENTIAL/PLATELET
BASOS PCT: 0 %
Basophils Absolute: 0 10*3/uL (ref 0.0–0.1)
Eosinophils Absolute: 0.6 10*3/uL (ref 0.0–0.7)
Eosinophils Relative: 4 %
HEMATOCRIT: 24.5 % — AB (ref 39.0–52.0)
HEMOGLOBIN: 7.4 g/dL — AB (ref 13.0–17.0)
LYMPHS ABS: 1.8 10*3/uL (ref 0.7–4.0)
Lymphocytes Relative: 10 %
MCH: 24.6 pg — ABNORMAL LOW (ref 26.0–34.0)
MCHC: 30.2 g/dL (ref 30.0–36.0)
MCV: 81.4 fL (ref 78.0–100.0)
MONOS PCT: 13 %
Monocytes Absolute: 2.3 10*3/uL — ABNORMAL HIGH (ref 0.1–1.0)
NEUTROS ABS: 13.3 10*3/uL — AB (ref 1.7–7.7)
NEUTROS PCT: 73 %
Platelets: 438 10*3/uL — ABNORMAL HIGH (ref 150–400)
RBC: 3.01 MIL/uL — ABNORMAL LOW (ref 4.22–5.81)
RDW: 20.6 % — ABNORMAL HIGH (ref 11.5–15.5)
WBC: 18 10*3/uL — ABNORMAL HIGH (ref 4.0–10.5)

## 2015-06-06 LAB — COMPREHENSIVE METABOLIC PANEL
ALBUMIN: 2.1 g/dL — AB (ref 3.5–5.0)
ALT: 25 U/L (ref 17–63)
AST: 19 U/L (ref 15–41)
Alkaline Phosphatase: 92 U/L (ref 38–126)
Anion gap: 7 (ref 5–15)
BILIRUBIN TOTAL: 0.5 mg/dL (ref 0.3–1.2)
BUN: 19 mg/dL (ref 6–20)
CHLORIDE: 105 mmol/L (ref 101–111)
CO2: 28 mmol/L (ref 22–32)
Calcium: 8 mg/dL — ABNORMAL LOW (ref 8.9–10.3)
Creatinine, Ser: 0.5 mg/dL — ABNORMAL LOW (ref 0.61–1.24)
GFR calc Af Amer: >60 mL/min (ref >60)
GFR calc non Af Amer: >60 mL/min (ref >60)
GLUCOSE: 123 mg/dL — AB (ref 65–99)
POTASSIUM: 4.2 mmol/L (ref 3.5–5.1)
Sodium: 140 mmol/L (ref 135–145)
TOTAL PROTEIN: 5.7 g/dL — AB (ref 6.5–8.1)

## 2015-06-06 NOTE — Progress Notes (Signed)
[name] [name] [id number] DOB: 1965/06/30 DOA: 05/28/2015 PCP: [name], MD  Summary&Daily Progress Notes 06/03/15: I have seen and examined [name] [name] at bedside and reviewed his chart. Appreciate ID/palliative care. Patient with quadriplegia being treated for sepsis related to Providencia, also has pseudomonas in urine culture, chronic osteomyelitis, etc. He has worsening leukocytosis despite antibiotics and has been referred to palliative care. Central line was removed today and this may have been source of infection. The concern is that his white count has increased to 23,000 today. We'll continue current antibiotics, follow white count and follow infectious diseases in a.m. patient being considered for LTAC 06/04/15: Appreciate ID/palliative care. White count appears to be improving 23.000>18.000. Potassium remains low at 3.2. Await decision regarding LTAC placement. We will continue current antibiotics. ID recommendations, replenish electrolytes as necessary and plan eventual discharge to LTAC/home with home health services. 06/05/15: Appreciate ID. Patient with low grade fever and persistently elevated white count 18.900. This is concerning. Potassium normal. Patient felt lowsy in am. He should complete 14 days of antibiotics per ID. Turned down by [facility], hence plan for home with iv antibiotics/hhs. 06/06/15: White count remains at 18,000 and patient had low-grade fever. Patient nauseated earlier today. Will have to see if patient develops full-blown fever with increasing leukocytosis. Will touch base with ID in a.m. Meanwhile, continue Zosyn and replenish electrolytes as necessary. Problem List Plan  Principal Problem:   Gram-negative bacteremia ([place]) Active Problems:   Quadriplegia ([name]   Leukocytosis   Sacral decubitus ulcer, stage IV ([name]   Complicated UTI (urinary tract infection)   OSA on CPAP   Chronic osteomyelitis, pelvic region and thigh ([name]   Decubitus ulcer of lower  extremity, stage 2   Pressure ulcer of right upper back   History of MDR Pseudomonas aeruginosa infection   Anemia of chronic disease   Sepsis ([name]   Septic shock ([name]   Palliative care encounter   Infection, Pseudomonas   Continue Zosyn per ID   Follow-up ID in a.m.   Pan culture fever   Code Status: Full Code Family Communication: Family member over the phone on 06/03/15 Disposition Plan: Home early next week if evidence of control of infection.   Consultants:  Palliative care  ID  Procedures:    Antibiotics:  Zosyn  HPI/Subjective: Felt nauseated earlier today.  Objective: Filed Vitals:   06/06/15 1347 06/06/15 2100  BP: 110/60 112/62  Pulse: 107 120  Temp: 98 F (36.7 C) 98.8 F (37.1 C)  Resp: 16 18    Intake/Output Summary (Last 24 hours) at 06/06/15 2133 Last data filed at 06/06/15 2100  Gross per 24 hour  Intake   1110 ml  Output   4276 ml  Net  -3166 ml   Filed Weights   05/28/15 1951 05/29/15 1623  Weight: 91.627 kg (202 lb) 91 kg (200 lb 9.9 oz)    Exam:   General:  Comfortable at rest.  Cardiovascular: S1-S2 normal. No murmurs. Pulse regular.  Respiratory: Good air entry bilaterally. No rhonchi or rales.  Abdomen: Soft and nontender. Normal bowel sounds. No organomegaly. Suprapubic catheter in place  Musculoskeletal: No pedal edema   Neurological: No acute changes  Data Reviewed: Basic Metabolic Panel:  Recent Labs Lab 06/01/15 0410 06/02/15 0308 06/03/15 0404 06/04/15 0330 06/05/15 0420 06/06/15 0410  NA 143 146* 142 144 141 140  K 2.9* 3.1* 2.9* 3.2* 4.5 4.2  CL 110 113* 110 110 107 105  CO2 24 26 28  26  27 28  GLUCOSE 131* 89 108* 96 111* 123*  BUN 13 14 13 18 17 19   CREATININE 0.46* 0.49* 0.46* 0.55* 0.47* 0.50*  CALCIUM 8.4* 8.6* 8.4* 8.6* 8.3* 8.0*  MG 1.6* 1.9  --  2.1  --   --    Liver Function Tests:  Recent Labs Lab 06/04/15 0330 06/05/15 0420 06/06/15 0410  AST 27 26 19   ALT 35 35 25   ALKPHOS 105 103 92  BILITOT 0.2* 0.4 0.5  PROT 5.8* 6.1* 5.7*  ALBUMIN 2.1* 2.2* 2.1*   No results for input(s): LIPASE, AMYLASE in the last 168 hours. No results for input(s): AMMONIA in the last 168 hours. CBC:  Recent Labs Lab 06/02/15 0308 06/03/15 0404 06/04/15 0330 06/05/15 0420 06/06/15 0410  WBC 18.2* 23.2* 18.4* 18.9* 18.0*  NEUTROABS  --   --  13.5* 13.8* 13.3*  HGB 8.0* 8.0* 7.7* 8.0* 7.4*  HCT 26.8* 26.0* 25.2* 25.9* 24.5*  MCV 80.7 80.0 81.0 78.7 81.4  PLT 476* 471* 460* 485* 438*   Cardiac Enzymes: No results for input(s): CKTOTAL, CKMB, CKMBINDEX, TROPONINI in the last 168 hours. BNP (last 3 results) No results for input(s): BNP in the last 8760 hours.  ProBNP (last 3 results) No results for input(s): PROBNP in the last 8760 hours.  CBG: No results for input(s): GLUCAP in the last 168 hours.  Recent Results (from the past 240 hour(s))  Urine culture     Status: None   Collection Time: 05/28/15  8:39 PM  Result Value Ref Range Status   Specimen Description URINE, CATHETERIZED  Final   Special Requests NONE  Final   Culture   Final    MULTIPLE SPECIES PRESENT, SUGGEST RECOLLECTION Performed at [facility]    Report Status 05/30/2015 FINAL  Final  Blood Culture (routine x 2)     Status: None   Collection Time: 05/28/15 10:55 PM  Result Value Ref Range Status   Specimen Description BLOOD RIGHT FEMORAL ARTERY  Final   Special Requests BOTTLES DRAWN AEROBIC AND ANAEROBIC 5CC  Final   Culture  Setup Time   Final    GRAM NEGATIVE RODS IN BOTH AEROBIC AND ANAEROBIC BOTTLES CRITICAL RESULT CALLED TO, READ BACK BY AND VERIFIED WITHCleotilde Neer RN 1911 05/29/15 A [facility]    Culture   Final    PROVIDENCIA STUARTII Performed at [facility]    Report Status 06/01/2015 FINAL  Final   Organism ID, Bacteria PROVIDENCIA STUARTII  Final      Susceptibility   Providencia stuartii - MIC*    AMPICILLIN >=32 RESISTANT Resistant     CEFAZOLIN  >=64 RESISTANT Resistant     CEFEPIME <=1 SENSITIVE Sensitive     CEFTAZIDIME <=1 SENSITIVE Sensitive     CEFTRIAXONE <=1 SENSITIVE Sensitive     CIPROFLOXACIN >=4 RESISTANT Resistant     GENTAMICIN 8 RESISTANT Resistant     IMIPENEM 2 SENSITIVE Sensitive     TRIMETH/SULFA >=320 RESISTANT Resistant     AMPICILLIN/SULBACTAM >=32 RESISTANT Resistant     PIP/TAZO <=4 SENSITIVE Sensitive     * PROVIDENCIA STUARTII  Blood Culture (routine x 2)     Status: None   Collection Time: 05/29/15  6:00 AM  Result Value Ref Range Status   Specimen Description BLOOD CENTRAL LINE  Final   Special Requests BOTTLES DRAWN AEROBIC AND ANAEROBIC 5ML  Final   Culture   Final    NO GROWTH 5 DAYS Performed at [facility]    Report Status 06/03/2015 FINAL  Final  MRSA PCR Screening     Status: Abnormal   Collection Time: 05/29/15  4:29 PM  Result Value Ref Range Status   MRSA by PCR INVALID RESULTS, SPECIMEN SENT FOR CULTURE (A) NEGATIVE Final    Comment: BRIE MCNABB,RN [id number] @ [id number] BY J SCOTTON        The GeneXpert MRSA Assay (FDA approved for NASAL specimens only), is one component of a comprehensive MRSA colonization surveillance program. It is not intended to diagnose MRSA infection nor to guide or monitor treatment for MRSA infections.   MRSA culture     Status: None   Collection Time: 05/29/15  4:30 PM  Result Value Ref Range Status   Specimen Description NOSE  Final   Special Requests NONE  Final   Culture NOMRSA Performed at [facility]   Final   Report Status 05/31/2015 FINAL  Final  C difficile quick scan w PCR reflex     Status: None   Collection Time: 05/29/15  6:49 PM  Result Value Ref Range Status   C Diff antigen NEGATIVE NEGATIVE Final   C Diff toxin NEGATIVE NEGATIVE Final   C Diff interpretation Negative for toxigenic C. difficile  Final  Wound culture     Status: None   Collection Time: 05/31/15  5:02 AM  Result Value Ref Range Status   Specimen  Description SACRAL  Final   Special Requests NONE  Final   Gram Stain   Final    FEW WBC NO SQUAMOUS EPITHELIAL CELLS SEEN FEW GRAM NEGATIVE RODS Performed at [facility]    Culture   Final    MULTIPLE ORGANISMS PRESENT, NONE PREDOMINANT Note: NO STAPHYLOCOCCUS AUREUS ISOLATED NO GROUP A STREP (S.PYOGENES) ISOLATED Performed at [facility]    Report Status 06/02/2015 FINAL  Final  Urine culture     Status: None   Collection Time: 05/31/15  5:02 AM  Result Value Ref Range Status   Specimen Description URINE, CATHETERIZED  Final   Special Requests NONE  Final   Culture   Final    60,000 COLONIES/ml PSEUDOMONAS AERUGINOSA 60,000 COLONIES/ml PROVIDENCIA STUARTII Performed at [facility]    Report Status 06/03/2015 FINAL  Final   Organism ID, Bacteria PSEUDOMONAS AERUGINOSA  Final   Organism ID, Bacteria PROVIDENCIA STUARTII  Final      Susceptibility   Pseudomonas aeruginosa - MIC*    CEFTAZIDIME 4 SENSITIVE Sensitive     CIPROFLOXACIN 2 INTERMEDIATE Intermediate     GENTAMICIN <=1 SENSITIVE Sensitive     IMIPENEM >=16 RESISTANT Resistant     PIP/TAZO 16 SENSITIVE Sensitive     CEFEPIME 8 SENSITIVE Sensitive     * 60,000 COLONIES/ml PSEUDOMONAS AERUGINOSA   Providencia stuartii - MIC*    AMPICILLIN >=32 RESISTANT Resistant     CEFAZOLIN >=64 RESISTANT Resistant     CEFTRIAXONE <=1 SENSITIVE Sensitive     CIPROFLOXACIN >=4 RESISTANT Resistant     GENTAMICIN 8 RESISTANT Resistant     IMIPENEM 2 SENSITIVE Sensitive     NITROFURANTOIN 128 RESISTANT Resistant     TRIMETH/SULFA >=320 RESISTANT Resistant     AMPICILLIN/SULBACTAM >=32 RESISTANT Resistant     PIP/TAZO <=4 SENSITIVE Sensitive     * 60,000 COLONIES/ml PROVIDENCIA STUARTII  MRSA PCR Screening     Status: None   Collection Time: 05/31/15 12:05 PM  Result Value Ref Range Status   MRSA by PCR  NEGATIVE NEGATIVE Final    Comment:        The GeneXpert MRSA Assay (FDA approved for NASAL  specimens only), is one component of a comprehensive MRSA colonization surveillance program. It is not intended to diagnose MRSA infection nor to guide or monitor treatment for MRSA infections. DELTA CHECK NOTED      Studies: No results found.  Scheduled Meds: . baclofen  20 mg Oral QID  . collagenase   Topical Daily  . darifenacin  15 mg Oral Daily  . docusate sodium  200 mg Oral BID  . enoxaparin (LOVENOX) injection  40 mg Subcutaneous Q24H  . ferrous sulfate  325 mg Oral TID WC  . lactose free nutrition  237 mL Oral TID WC  . magnesium oxide  400 mg Oral Daily  . metoCLOPramide  10 mg Oral TID AC  . mirabegron ER  25 mg Oral Daily  . multivitamin with minerals  1 tablet Oral q morning - 10a  . pantoprazole  40 mg Oral BID AC  . piperacillin-tazobactam (ZOSYN)  IV  3.375 g Intravenous Q8H  . protein supplement  8 oz Oral BID BM  . sodium chloride  500 mL Intravenous Once  . sodium chloride  3 mL Intravenous Q12H  . sucralfate  1 g Oral QID  . vitamin C  500 mg Oral q morning - 10a  . zinc sulfate  220 mg Oral BID   Continuous Infusions:    Time spent: 25 minutes    Jayten Gabbard  [facility] Pager [phone]. If 7PM-7AM, please contact night-coverage at [email or web address], password [facility] 06/06/2015, 9:33 PM  LOS: 8 days

## 2015-06-07 LAB — CBC WITH DIFFERENTIAL/PLATELET
Basophils Absolute: 0 10*3/uL (ref 0.0–0.1)
Basophils Relative: 0 %
EOS ABS: 0.7 10*3/uL (ref 0.0–0.7)
Eosinophils Relative: 4 %
HCT: 24 % — ABNORMAL LOW (ref 39.0–52.0)
HEMOGLOBIN: 7.1 g/dL — AB (ref 13.0–17.0)
LYMPHS ABS: 1.6 10*3/uL (ref 0.7–4.0)
LYMPHS PCT: 8 %
MCH: 24.3 pg — AB (ref 26.0–34.0)
MCHC: 29.6 g/dL — ABNORMAL LOW (ref 30.0–36.0)
MCV: 82.2 fL (ref 78.0–100.0)
Monocytes Absolute: 2.2 10*3/uL — ABNORMAL HIGH (ref 0.1–1.0)
Monocytes Relative: 11 %
NEUTROS PCT: 77 %
Neutro Abs: 15 10*3/uL — ABNORMAL HIGH (ref 1.7–7.7)
Platelets: 428 10*3/uL — ABNORMAL HIGH (ref 150–400)
RBC: 2.92 MIL/uL — AB (ref 4.22–5.81)
RDW: 20.8 % — ABNORMAL HIGH (ref 11.5–15.5)
WBC: 19.5 10*3/uL — AB (ref 4.0–10.5)

## 2015-06-07 LAB — COMPREHENSIVE METABOLIC PANEL
ALK PHOS: 85 U/L (ref 38–126)
ALT: 19 U/L (ref 17–63)
AST: 18 U/L (ref 15–41)
Albumin: 2.1 g/dL — ABNORMAL LOW (ref 3.5–5.0)
Anion gap: 8 (ref 5–15)
BUN: 18 mg/dL (ref 6–20)
CALCIUM: 8.4 mg/dL — AB (ref 8.9–10.3)
CO2: 30 mmol/L (ref 22–32)
CREATININE: 0.52 mg/dL — AB (ref 0.61–1.24)
Chloride: 105 mmol/L (ref 101–111)
GFR calc non Af Amer: >60 mL/min (ref >60)
Glucose, Bld: 126 mg/dL — ABNORMAL HIGH (ref 65–99)
Potassium: 4 mmol/L (ref 3.5–5.1)
SODIUM: 143 mmol/L (ref 135–145)
Total Bilirubin: 0.2 mg/dL — ABNORMAL LOW (ref 0.3–1.2)
Total Protein: 5.9 g/dL — ABNORMAL LOW (ref 6.5–8.1)

## 2015-06-07 MED ORDER — VANCOMYCIN HCL 10 G IV SOLR
1250.0000 mg | Freq: Two times a day (BID) | INTRAVENOUS | Status: DC
  Administered 2015-06-07 – 2015-06-12 (×10): 1250 mg via INTRAVENOUS
  Filled 2015-06-07: qty 1000
  Filled 2015-06-07: qty 750
  Filled 2015-06-07 (×10): qty 1250

## 2015-06-07 MED ORDER — CIPROFLOXACIN IN D5W 400 MG/200ML IV SOLN
400.0000 mg | Freq: Two times a day (BID) | INTRAVENOUS | Status: DC
  Administered 2015-06-08 (×2): 400 mg via INTRAVENOUS
  Filled 2015-06-07 (×2): qty 200

## 2015-06-07 NOTE — Progress Notes (Signed)
RN placed patient on CPAP.  RT will continue to monitor. 
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
## 2015-06-08 LAB — CBC WITH DIFFERENTIAL/PLATELET
BASOS ABS: 0 10*3/uL (ref 0.0–0.1)
Basophils Relative: 0 %
EOS ABS: 0.5 10*3/uL (ref 0.0–0.7)
EOS PCT: 3 %
HCT: 22.1 % — ABNORMAL LOW (ref 39.0–52.0)
Hemoglobin: 6.8 g/dL — CL (ref 13.0–17.0)
LYMPHS PCT: 10 %
Lymphs Abs: 1.9 10*3/uL (ref 0.7–4.0)
MCH: 24.6 pg — AB (ref 26.0–34.0)
MCHC: 30.8 g/dL (ref 30.0–36.0)
MCV: 80.1 fL (ref 78.0–100.0)
MONO ABS: 2.4 10*3/uL — AB (ref 0.1–1.0)
Monocytes Relative: 13 %
Neutro Abs: 13.4 10*3/uL — ABNORMAL HIGH (ref 1.7–7.7)
Neutrophils Relative %: 74 %
PLATELETS: 406 10*3/uL — AB (ref 150–400)
RBC: 2.76 MIL/uL — ABNORMAL LOW (ref 4.22–5.81)
RDW: 20.6 % — AB (ref 11.5–15.5)
WBC: 18.2 10*3/uL — ABNORMAL HIGH (ref 4.0–10.5)

## 2015-06-08 LAB — COMPREHENSIVE METABOLIC PANEL
ALK PHOS: 84 U/L (ref 38–126)
ALT: 15 U/L — AB (ref 17–63)
AST: 16 U/L (ref 15–41)
Albumin: 2 g/dL — ABNORMAL LOW (ref 3.5–5.0)
Anion gap: 9 (ref 5–15)
BUN: 15 mg/dL (ref 6–20)
CALCIUM: 7.9 mg/dL — AB (ref 8.9–10.3)
CO2: 28 mmol/L (ref 22–32)
CREATININE: 0.4 mg/dL — AB (ref 0.61–1.24)
Chloride: 102 mmol/L (ref 101–111)
GFR calc non Af Amer: >60 mL/min (ref >60)
Glucose, Bld: 125 mg/dL — ABNORMAL HIGH (ref 65–99)
Potassium: 3.6 mmol/L (ref 3.5–5.1)
SODIUM: 139 mmol/L (ref 135–145)
Total Bilirubin: 0.5 mg/dL (ref 0.3–1.2)
Total Protein: 5.6 g/dL — ABNORMAL LOW (ref 6.5–8.1)

## 2015-06-08 LAB — PREPARE RBC (CROSSMATCH)

## 2015-06-08 MED ORDER — BOOST PLUS PO LIQD
237.0000 mL | Freq: Four times a day (QID) | ORAL | Status: DC
  Administered 2015-06-08 – 2015-06-12 (×13): 237 mL via ORAL
  Filled 2015-06-08 (×19): qty 237

## 2015-06-08 MED ORDER — SODIUM CHLORIDE 0.9 % IV SOLN
Freq: Once | INTRAVENOUS | Status: AC
  Administered 2015-06-08: 11:00:00 via INTRAVENOUS

## 2015-06-08 MED ORDER — SODIUM CHLORIDE 0.9 % IV SOLN: Freq: Once | INTRAVENOUS | Status: AC

## 2015-06-08 NOTE — Progress Notes (Signed)
NP on call made aware of lab being unsuccessful in drawing blood cultures from pt per order. Will continue to monitor pt closely. Carnella [place] I

## 2015-06-08 NOTE — Progress Notes (Signed)
   06/08/15 1700 Hydro therapy treatment. [id number]  Subjective Assessment   Subjective no c/o  Patient and Family Stated Goals agreed to wound care.  Date of Onset (present several months)  Prior Treatments HH  dressing changes  Evaluation and Treatment  Evaluation and Treatment Procedures Explained to Patient/Family Yes  Evaluation and Treatment Procedures agreed to  Pressure Ulcer 05/30/15 Stage II -  Partial thickness loss of dermis presenting as a shallow open ulcer with a red, pink wound bed without slough. pressure injury  Date First Assessed/Time First Assessed: 05/30/15 1800   Location: Other (Comment)  Location Orientation: (c) Left  Staging: Stage II -  Partial thickness loss of dermis presenting as a shallow open ulcer with a red, pink wound bed without slough.  Wo...  Dressing Type (Santyl, kerlix, ABD)  Dressing (appeared saturated with ?urine)  Wound / Incision (Open or Dehisced) 06/03/15 Other (Comment) Sacrum Other (Comment) large open wound on buttocks with 2 areas within that have slough.  Date First Assessed/Time First Assessed: 06/03/15 1140   Wound Type: (c) Other (Comment)  Location: Sacrum  Location Orientation: (c) Other (Comment)  Wound Description (Comments): large open wound on buttocks with 2 areas within that have slough.  Pr...  Dressing Type ABD;Moist to dry (santyl)  Dressing Changed New  Dressing Status Clean  Dressing Change Frequency Daily  Site / Wound Assessment Bleeding;Granulation tissue;Yellow  % Wound base Red or Granulating 80%  % Wound base Yellow 20%  % Wound base Other (Comment) (palpable bone)  Peri-wound Assessment Edema  Margins Unattached edges (unapproximated)  Drainage Amount Copious (dressing saturated in urine.)  Drainage Description (urine)  Non-staged Wound Description Full thickness  Hydrotherapy  Pulsed lavage therapy - wound location # 1 is R hip location  With slough, #2 is L sacrum and a smaller one below  With  with  mostly pink below  #2  Pulsed Lavage with Suction (psi) 8 psi  Pulsed Lavage with Suction - Normal Saline Used 1000 mL  Pulsed Lavage Tip Tip with splash shield  Wound Therapy - Assess/Plan/Recommendations  Wound Therapy - Clinical Statement patient's dressing is saturated with drainage, some bleeding from various ares  of the wound.  Wound Therapy - Functional Problem List quadriplegia of longstanding  Factors Delaying/Impairing Wound Healing Altered sensation;Incontinence;Infection - systemic/local;Immobility;Multiple medical problems  Hydrotherapy Plan Patient/family education;Pulsatile lavage with suction (for a trial period.)  Wound Therapy - Frequency 3X / week  Wound Therapy - Current Recommendations Case manager/social work  Wound Therapy - Follow Up Recommendations Skilled nursing facility  Wound Plan trial of PLS,  Wound Therapy Goals - Improve the function of patient's integumentary system by progressing the wound(s) through the phases of wound healing by:  Decrease Necrotic Tissue to 10  Decrease Necrotic Tissue - Progress Progressing toward goal  Increase Granulation Tissue to 90  Increase Granulation Tissue - Progress Progressing toward goal  Improve Drainage Characteristics Min  Goals/treatment plan/discharge plan were made with and agreed upon by patient/family Yes  Time For Goal Achievement 2 weeks  Wound Therapy - Potential for Goals Poor  Tresa Endo PT [phone]
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
## 2015-06-08 NOTE — Progress Notes (Signed)
Temp high this shift was 101.2 and after his first doses of Vancomycin and cipro his temp decreased to 98.7.
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
## 2015-06-08 NOTE — Progress Notes (Signed)
Spoke with pt regarding cpap.  Pt stated he was not ready and asked that I come back around midnight.  This RT will return around that time per pt request.

## 2015-06-08 NOTE — Progress Notes (Signed)
    [facility] for Infectious Disease    Date of Admission:  05/28/2015   Total days of antibiotics 12        Day 12 piptazo           ID: [name] [name] is a 50 y.o. male with incomplete quadriplegia with chronic folly, and  Chronic decub ulses and osteo presenting with sepsis found to have providencia bacteremia Principal Problem:   Gram-negative bacteremia ([place]) Active Problems:   Quadriplegia ([place])   Leukocytosis   Sacral decubitus ulcer, stage IV ([name]   Complicated UTI (urinary tract infection)   OSA on CPAP   Chronic osteomyelitis, pelvic region and thigh ([place])   Decubitus ulcer of lower extremity, stage 2   Pressure ulcer of right upper back   History of MDR Pseudomonas aeruginosa infection   Anemia of chronic disease   Sepsis ([place])   Septic shock ([place])   Palliative care encounter   Infection, Pseudomonas    Subjective: Had fevers up to 101.2 F yesterday.   Interval hx: empirically started on cipro and vanco  Medications:  . baclofen  20 mg Oral QID  . collagenase   Topical Daily  . darifenacin  15 mg Oral Daily  . docusate sodium  200 mg Oral BID  . ferrous sulfate  325 mg Oral TID WC  . lactose free nutrition  237 mL Oral QID  . magnesium oxide  400 mg Oral Daily  . metoCLOPramide  10 mg Oral TID AC  . mirabegron ER  25 mg Oral Daily  . multivitamin with minerals  1 tablet Oral q morning - 10a  . pantoprazole  40 mg Oral BID AC  . piperacillin-tazobactam (ZOSYN)  IV  3.375 g Intravenous Q8H  . sodium chloride  500 mL Intravenous Once  . sodium chloride  3 mL Intravenous Q12H  . sucralfate  1 g Oral QID  . vancomycin  1,250 mg Intravenous Q12H  . vitamin C  500 mg Oral q morning - 10a  . zinc sulfate  220 mg Oral BID    Objective: Vital signs in last 24 hours: Temp:  [98.2 F (36.8 C)-101.2 F (38.4 C)] 99 F (37.2 C) (01/23 1100) Pulse Rate:  [73-114] 114 (01/23 1100) Resp:  [20-22] 22 (01/23 1100) BP: (95-146)/(45-80) 146/76 mmHg (01/23  1100) SpO2:  [95 %-100 %] 100 % (01/23 1100) Patient away from room.    Lab Results  Recent Labs  06/07/15 0505 06/08/15 0409  WBC 19.5* 18.2*  HGB 7.1* 6.8*  HCT 24.0* 22.1*  NA 143 139  K 4.0 3.6  CL 105 102  CO2 30 28  BUN 18 15  CREATININE 0.52* 0.40*    Microbiology: Ur cx: 60,000 providencia nad 60,000 PsA Blood cx 1/2 on 1/13 + providencia S cefepime, ceftaz, pip/tazo, imi, ctx. R cefazolin, R FQ, R sulfa Wound cx: multiple organism  Assessment/Plan: Providencia bacteremia likely related to urinary source (cx+) = currently on day 12 of 14 with piptazo  Fevers = recommend to repeat blood cx. Can continue on vancomycin for now. Recommend to d/c cipro. No need for double coverage, in addition he is colonized with cipro R pathogens. Will continue to monitor to see if need to change his regimen  Chronic wounds/decub ulcers = continue with current wound care plan  Baxter Flattery [facility] Cell: [phone] Pager: [phone]  06/08/2015, 1:31 PM

## 2015-06-08 NOTE — Progress Notes (Signed)
Lab reports HGB has decreased to 6.8 this morning.  [name] [name] on call and notified and am awaiting response.
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
## 2015-06-09 LAB — CBC WITH DIFFERENTIAL/PLATELET
BASOS ABS: 0 10*3/uL (ref 0.0–0.1)
BASOS PCT: 0 %
EOS PCT: 3 %
Eosinophils Absolute: 0.6 10*3/uL (ref 0.0–0.7)
HCT: 27.3 % — ABNORMAL LOW (ref 39.0–52.0)
Hemoglobin: 8.6 g/dL — ABNORMAL LOW (ref 13.0–17.0)
Lymphocytes Relative: 9 %
Lymphs Abs: 1.5 10*3/uL (ref 0.7–4.0)
MCH: 26.1 pg (ref 26.0–34.0)
MCHC: 31.5 g/dL (ref 30.0–36.0)
MCV: 82.7 fL (ref 78.0–100.0)
MONO ABS: 2.2 10*3/uL — AB (ref 0.1–1.0)
Monocytes Relative: 13 %
Neutro Abs: 12.7 10*3/uL — ABNORMAL HIGH (ref 1.7–7.7)
Neutrophils Relative %: 75 %
PLATELETS: 396 10*3/uL (ref 150–400)
RBC: 3.3 MIL/uL — AB (ref 4.22–5.81)
RDW: 19.5 % — AB (ref 11.5–15.5)
WBC: 17 10*3/uL — ABNORMAL HIGH (ref 4.0–10.5)

## 2015-06-09 LAB — TYPE AND SCREEN
ABO/RH(D): B POS
Antibody Screen: NEGATIVE
Unit division: 0
Unit division: 0

## 2015-06-09 LAB — COMPREHENSIVE METABOLIC PANEL
ALBUMIN: 2.1 g/dL — AB (ref 3.5–5.0)
ALT: 15 U/L — ABNORMAL LOW (ref 17–63)
ANION GAP: 7 (ref 5–15)
AST: 15 U/L (ref 15–41)
Alkaline Phosphatase: 96 U/L (ref 38–126)
BUN: 15 mg/dL (ref 6–20)
CHLORIDE: 105 mmol/L (ref 101–111)
CO2: 30 mmol/L (ref 22–32)
Calcium: 8.4 mg/dL — ABNORMAL LOW (ref 8.9–10.3)
Creatinine, Ser: 0.44 mg/dL — ABNORMAL LOW (ref 0.61–1.24)
GFR calc Af Amer: >60 mL/min (ref >60)
GFR calc non Af Amer: >60 mL/min (ref >60)
GLUCOSE: 128 mg/dL — AB (ref 65–99)
POTASSIUM: 3.8 mmol/L (ref 3.5–5.1)
SODIUM: 142 mmol/L (ref 135–145)
TOTAL PROTEIN: 5.9 g/dL — AB (ref 6.5–8.1)
Total Bilirubin: 0.3 mg/dL (ref 0.3–1.2)

## 2015-06-09 LAB — MAGNESIUM: Magnesium: 1.6 mg/dL — ABNORMAL LOW (ref 1.7–2.4)

## 2015-06-09 LAB — PHOSPHORUS: Phosphorus: 3 mg/dL (ref 2.5–4.6)

## 2015-06-09 NOTE — Progress Notes (Signed)
[facility] for Infectious Disease    Date of Admission:  05/28/2015   Total days of antibiotics 13        Day 13piptazo           ID: [name] [name] is a 50 y.o. male with incomplete quadriplegia with chronic folly, and  Chronic decub ulses and osteo presenting with sepsis found to have providencia bacteremia Principal Problem:   Gram-negative bacteremia ([place]) Active Problems:   Quadriplegia ([place])   Leukocytosis   Sacral decubitus ulcer, stage IV ([name]   Complicated UTI (urinary tract infection)   OSA on CPAP   Chronic osteomyelitis, pelvic region and thigh ([place])   Decubitus ulcer of lower extremity, stage 2   Pressure ulcer of right upper back   History of MDR Pseudomonas aeruginosa infection   Anemia of chronic disease   Sepsis ([place])   Septic shock ([place])   Palliative care encounter   Infection, Pseudomonas    Subjective: Had fevers up to 100.1 today. He noticed having chills when undergoing wound care changes/hydrotherapy  Interval hx: had palliative mtg  Medications:  . baclofen  20 mg Oral QID  . collagenase   Topical Daily  . darifenacin  15 mg Oral Daily  . docusate sodium  200 mg Oral BID  . ferrous sulfate  325 mg Oral TID WC  . lactose free nutrition  237 mL Oral QID  . magnesium oxide  400 mg Oral Daily  . metoCLOPramide  10 mg Oral TID AC  . mirabegron ER  25 mg Oral Daily  . multivitamin with minerals  1 tablet Oral q morning - 10a  . pantoprazole  40 mg Oral BID AC  . piperacillin-tazobactam (ZOSYN)  IV  3.375 g Intravenous Q8H  . sodium chloride  500 mL Intravenous Once  . sodium chloride  3 mL Intravenous Q12H  . sucralfate  1 g Oral QID  . vancomycin  1,250 mg Intravenous Q12H  . vitamin C  500 mg Oral q morning - 10a  . zinc sulfate  220 mg Oral BID    Objective: Vital signs in last 24 hours: Temp:  [98.2 F (36.8 C)-100.1 F (37.8 C)] 98.2 F (36.8 C) (01/24 1402) Pulse Rate:  [92-117] 92 (01/24 1402) Resp:  [18-20] 18 (01/24  1402) BP: (106-135)/(56-77) 112/73 mmHg (01/24 1402) SpO2:  [95 %-100 %] 100 % (01/24 1402) Physical Exam  Constitutional: He is oriented to person, place, and time. He appears well-developed and well-nourished. No distress. Towel over his head listening to headphones HENT:  Mouth/Throat: Oropharynx is clear and moist. No oropharyngeal exudate.  Cardiovascular: Normal rate, regular rhythm and normal heart sounds. Exam reveals no gallop and no friction rub.  No murmur heard.  Pulmonary/Chest: Effort normal and breath sounds normal. No respiratory distress. He has no wheezes.  Abdominal: Soft. Bowel sounds are decreased. He exhibits mild distention Lymphadenopathy:  He has no cervical adenopathy.  Neurological: flaccid paralysis of lower extremities, partial movement of upper extremities Skin: wounds are wrapped Psychiatric: He has a normal mood and affect. His behavior is normal.      Lab Results  Recent Labs  06/08/15 0409 06/09/15 0350  WBC 18.2* 17.0*  HGB 6.8* 8.6*  HCT 22.1* 27.3*  NA 139 142  K 3.6 3.8  CL 102 105  CO2 28 30  BUN 15 15  CREATININE 0.40* 0.44*    Erythrocyte Sedimentation Rate     Component Value Date/Time   ESRSEDRATE  83* 05/29/2015 1710     Microbiology: Ur cx: 60,000 providencia nad 60,000 PsA Blood cx 1/2 on 1/13 + providencia S cefepime, ceftaz, pip/tazo, imi, ctx. R cefazolin, R FQ, R sulfa Wound cx: multiple organism  Assessment/Plan: Providencia bacteremia likely related to urinary source (cx+) = currently on day 13  with piptazo  Fevers = recommend to repeat blood cx are pending. Can continue on vancomycin for now.  Will continue to monitor cultures to see if need to change his regimen  Leukocytosis = wbc slightly improved since empirically starting vancomcyin  Chronic wounds/decub ulcers/osteo = continue with current wound care plan. His wounds are very advanced and would not tolerate debridement and healing process for  orthopedic/plastic evaluation. He has numerous GNR pathogen colonization that are becoming more resistant to treat. No oral options. Repeated course of antibiotics will eventually not be sustainable either if he continues to have more resistant pathogen. For now he is on piptazo and vancomycin, plan to treat til beginning of february. Agree with importance to discussion of goals of care.  Baxter Flattery [facility] for Infectious Diseases Cell: [phone] Pager: [phone]  06/09/2015, 4:20 PM

## 2015-06-09 NOTE — Progress Notes (Signed)
Np gave verbal orders to draw 1 set of blood cultures from PICC line since main lab unsuccessful drawing blood cultures x2. Will continue to monitor pt. [name] [name]
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
## 2015-06-09 NOTE — Care Management Important Message (Signed)
Important Message  Patient Details IM Letter given to [name]/Case Manager to present to Patient Name: [name] [name] MRN: [id number] Date of Birth: 03/18/1966   Medicare Important Message Given:  Yes    [name] 06/09/2015, 11:02 AMImportant Message  Patient Details  Name: [name] [name] MRN: [id number] Date of Birth: 1966/02/18   Medicare Important Message Given:  Yes    [name] 06/09/2015, 11:02 AM
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
## 2015-06-10 DIAGNOSIS — Z8619 Personal history of other infectious and parasitic diseases: Secondary | ICD-10-CM

## 2015-06-10 DIAGNOSIS — B965 Pseudomonas (aeruginosa) (mallei) (pseudomallei) as the cause of diseases classified elsewhere: Secondary | ICD-10-CM

## 2015-06-10 DIAGNOSIS — R7881 Bacteremia: Secondary | ICD-10-CM

## 2015-06-10 DIAGNOSIS — M86659 Other chronic osteomyelitis, unspecified thigh: Secondary | ICD-10-CM

## 2015-06-10 DIAGNOSIS — L89892 Pressure ulcer of other site, stage 2: Secondary | ICD-10-CM

## 2015-06-10 DIAGNOSIS — G4733 Obstructive sleep apnea (adult) (pediatric): Secondary | ICD-10-CM

## 2015-06-10 DIAGNOSIS — D638 Anemia in other chronic diseases classified elsewhere: Secondary | ICD-10-CM

## 2015-06-10 LAB — BASIC METABOLIC PANEL
Anion gap: 7 (ref 5–15)
BUN: 13 mg/dL (ref 6–20)
CO2: 29 mmol/L (ref 22–32)
Calcium: 8.2 mg/dL — ABNORMAL LOW (ref 8.9–10.3)
Chloride: 102 mmol/L (ref 101–111)
Creatinine, Ser: 0.38 mg/dL — ABNORMAL LOW (ref 0.61–1.24)
GFR calc Af Amer: >60 mL/min (ref >60)
GLUCOSE: 117 mg/dL — AB (ref 65–99)
POTASSIUM: 3.5 mmol/L (ref 3.5–5.1)
Sodium: 138 mmol/L (ref 135–145)

## 2015-06-10 LAB — MAGNESIUM: Magnesium: 1.6 mg/dL — ABNORMAL LOW (ref 1.7–2.4)

## 2015-06-10 LAB — CBC WITH DIFFERENTIAL/PLATELET
Basophils Absolute: 0 10*3/uL (ref 0.0–0.1)
Basophils Relative: 0 %
EOS PCT: 3 %
Eosinophils Absolute: 0.5 10*3/uL (ref 0.0–0.7)
HEMATOCRIT: 28.5 % — AB (ref 39.0–52.0)
Hemoglobin: 8.7 g/dL — ABNORMAL LOW (ref 13.0–17.0)
LYMPHS ABS: 1.5 10*3/uL (ref 0.7–4.0)
LYMPHS PCT: 8 %
MCH: 25.4 pg — AB (ref 26.0–34.0)
MCHC: 30.5 g/dL (ref 30.0–36.0)
MCV: 83.1 fL (ref 78.0–100.0)
MONO ABS: 2.4 10*3/uL — AB (ref 0.1–1.0)
Monocytes Relative: 14 %
Neutro Abs: 12.8 10*3/uL — ABNORMAL HIGH (ref 1.7–7.7)
Neutrophils Relative %: 75 %
PLATELETS: 424 10*3/uL — AB (ref 150–400)
RBC: 3.43 MIL/uL — AB (ref 4.22–5.81)
RDW: 19.9 % — AB (ref 11.5–15.5)
WBC: 17.2 10*3/uL — ABNORMAL HIGH (ref 4.0–10.5)

## 2015-06-10 LAB — VANCOMYCIN, TROUGH: Vancomycin Tr: 16 ug/mL (ref 10.0–20.0)

## 2015-06-10 MED ORDER — POTASSIUM CHLORIDE CRYS ER 20 MEQ PO TBCR
40.0000 meq | EXTENDED_RELEASE_TABLET | Freq: Once | ORAL | Status: AC
  Administered 2015-06-10: 40 meq via ORAL
  Filled 2015-06-10: qty 2

## 2015-06-10 MED ORDER — MAGNESIUM SULFATE 50 % IJ SOLN
3.0000 g | Freq: Once | INTRAVENOUS | Status: AC
  Administered 2015-06-10: 3 g via INTRAVENOUS
  Filled 2015-06-10: qty 6

## 2015-06-10 NOTE — Progress Notes (Signed)
   06/10/15 1300 Hydrotherapy treatment  Subjective Assessment  Subjective no c/o  Patient and Family Stated Goals agreed to wound care.  Date of Onset (present several months)  Prior Treatments HH  dressing changes  Evaluation and Treatment  Evaluation and Treatment Procedures Explained to Patient/Family Yes  Evaluation and Treatment Procedures agreed to  Pressure Ulcer 05/30/15 Stage II -  Partial thickness loss of dermis presenting as a shallow open ulcer with a red, pink wound bed without slough. pressure injury  Date First Assessed/Time First Assessed: 05/30/15 1800   Location: Other (Comment)  Location Orientation: (c) Left  Staging: Stage II -  Partial thickness loss of dermis presenting as a shallow open ulcer with a red, pink wound bed without slough.  Wo...  Dressing Type (Santyl, kerlix, ABD)  Dressing (appeared saturated with ?urine)  Wound / Incision (Open or Dehisced) 06/03/15 Other (Comment) Sacrum Other (Comment) large open wound on buttocks with 2 areas within that have slough.  Date First Assessed/Time First Assessed: 06/03/15 1140   Wound Type: (c) Other (Comment)  Location: Sacrum  Location Orientation: (c) Other (Comment)  Wound Description (Comments): large open wound on buttocks with 2 areas within that have slough.  Pr...  Dressing Type (santyl)  % Wound base Other (Comment) (palpable bone)  Peri-wound Assessment (maceration around testicles)  Drainage Amount (dressing saturated in urine.)  Drainage Description (urine)  Hydrotherapy  Pulsed lavage therapy - wound location # 1 is R hip location  withslough, #2 is L sacrum and a smaller one with mostly pink below  #2  Pulsed Lavage with Suction (psi) 12 psi  Pulsed Lavage with Suction - Normal Saline Used 1000 mL  Pulsed Lavage Tip Tip with splash shield  Wound Therapy - Assess/Plan/Recommendations  Wound Therapy - Clinical Statement patient's dressing is saturated with urine, towel that was wrapped around the  suprapubic catheter was soaked with urine, bed wet. Active  bleeding from various areas  of the wound.  Wound Therapy - Functional Problem List quadriplegia of longstanding  Factors Delaying/Impairing Wound Healing Altered sensation;Incontinence;Infection - systemic/local;Immobility;Multiple medical problems  Hydrotherapy Plan Patient/family education;Pulsatile lavage with suction (for a trial period.)  Wound Therapy - Frequency 3X / week  Wound Therapy - Current Recommendations Case manager/social work  Wound Therapy - Follow Up Recommendations Skilled nursing facility  Wound Plan trial of PLS,  Wound Therapy Goals - Improve the function of patient's integumentary system by progressing the wound(s) through the phases of wound healing by:  Decrease Necrotic Tissue to 10  Decrease Necrotic Tissue - Progress Progressing toward goal  Increase Granulation Tissue to 90  Increase Granulation Tissue - Progress Progressing toward goal  Improve Drainage Characteristics Min  Improve Drainage Characteristics - Progress Progressing toward goal  Goals/treatment plan/discharge plan were made with and agreed upon by patient/family Yes   yes  yesTime For Goal Achievement 2 weeks  Wound Therapy - Potential for Goals Poor  Tresa Endo PT [phone]

## 2015-06-10 NOTE — Progress Notes (Signed)
Sacral dressings changed by PT while doing hydrotherapy. Marland Kitchen

## 2015-06-10 NOTE — Progress Notes (Signed)
In consultation with palliative care, Chaplain assisted patient in completing Advance Directive.   Healthcare Power of Attorney and Living Will notarized, original and two copies given to patient.  Copy placed in paper chart and Copy given to medical records to be scanned into EPIC.      [place], [place]
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
## 2015-06-10 NOTE — Progress Notes (Signed)
Pharmacy Antibiotic Follow-up Note  [name] [name] is a 50 y.o. year-old male admitted on 05/28/2015.  The patient is currently on day #13 pip/tazo, day #3 vancomycin for providencia bacteremia and vancomycin added 1/22 for new leukocytosis and Fevers. ID reconsulted and seeing patient.  Repeat blood cultures pending.    Assessment/Plan:  Vancomycin trough acceptable today - continue 1250mg  IV q12h  Continue pip/tazo as ordered - plan as per ID  Temp (24hrs), Avg:99.5 F (37.5 C), Min:98.2 F (36.8 C), Max:100.6 F (38.1 C)   Recent Labs Lab 06/06/15 0410 06/07/15 0505 06/08/15 0409 06/09/15 0350 06/10/15 0427  WBC 18.0* 19.5* 18.2* 17.0* 17.2*    Recent Labs Lab 06/06/15 0410 06/07/15 0505 06/08/15 0409 06/09/15 0350 06/10/15 0427  CREATININE 0.50* 0.52* 0.40* 0.44* 0.38*   Estimated Creatinine Clearance: 123.9 mL/min (by C-G formula based on Cr of 0.38).    Allergies  Allergen Reactions  . Ditropan [Oxybutynin] Other (See Comments)    hallucinations    Antimicrobials this admission: 1/13 >> levaquin >> 1/13  1/13 >> vanc >> 1/15, 1/22 >> Vanc >>  1/13 >> zosyn >>  1/22 >> cipro(MD) >> 1/23  Levels/dose changes this admission: 1/15 0100 VT: 25 on vancomycin 1g q8h 1/25 0930 VT: 60mcg/ml on 1250mg  q12h (prior to 5th dose)  Microbiology results: 1/13 blood: 1 of 2 sets growing Providencia stuartii, S to Zosyn (and cefepime, ceftriaxone among others)  1/13 urine: suggest recollection, multiple species  1/13 cdiff neg/neg  1/15 MRSA PCR nasal screen: negative  1/13 MRSA nasal culture: negative  1/15 sacral wound cx: multiple organisms (non-predominant)  1/15 urine: 60K pseudomonas (S cefepime, ceftaz, gent, pip/tazo), 60K providencia (S ceftriaxone, imi, pip/tazo)  1/23 blood: pending  Thank you for allowing pharmacy to be a part of this patient's care.  Doreene Eland, PharmD, BCPS.   Pager: [phone] 06/10/2015 8:06 AM
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
## 2015-06-11 LAB — CBC
HEMATOCRIT: 27.3 % — AB (ref 39.0–52.0)
Hemoglobin: 8.3 g/dL — ABNORMAL LOW (ref 13.0–17.0)
MCH: 25.5 pg — ABNORMAL LOW (ref 26.0–34.0)
MCHC: 30.4 g/dL (ref 30.0–36.0)
MCV: 84 fL (ref 78.0–100.0)
Platelets: 430 10*3/uL — ABNORMAL HIGH (ref 150–400)
RBC: 3.25 MIL/uL — ABNORMAL LOW (ref 4.22–5.81)
RDW: 20.2 % — AB (ref 11.5–15.5)
WBC: 16.5 10*3/uL — ABNORMAL HIGH (ref 4.0–10.5)

## 2015-06-11 LAB — MAGNESIUM: Magnesium: 2 mg/dL (ref 1.7–2.4)

## 2015-06-11 LAB — BASIC METABOLIC PANEL
Anion gap: 9 (ref 5–15)
BUN: 14 mg/dL (ref 6–20)
CALCIUM: 8.3 mg/dL — AB (ref 8.9–10.3)
CO2: 28 mmol/L (ref 22–32)
CREATININE: 0.49 mg/dL — AB (ref 0.61–1.24)
Chloride: 104 mmol/L (ref 101–111)
Glucose, Bld: 104 mg/dL — ABNORMAL HIGH (ref 65–99)
Potassium: 3.7 mmol/L (ref 3.5–5.1)
SODIUM: 141 mmol/L (ref 135–145)
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
## 2015-06-11 NOTE — Progress Notes (Signed)
Placed patient on CPAP for the night.  Patient is tolerating well at this time. 

## 2015-06-12 DIAGNOSIS — B998 Other infectious disease: Secondary | ICD-10-CM

## 2015-06-12 DIAGNOSIS — R509 Fever, unspecified: Secondary | ICD-10-CM

## 2015-06-12 DIAGNOSIS — Z95828 Presence of other vascular implants and grafts: Secondary | ICD-10-CM

## 2015-06-12 DIAGNOSIS — L89899 Pressure ulcer of other site, unspecified stage: Secondary | ICD-10-CM

## 2015-06-12 LAB — CBC WITH DIFFERENTIAL/PLATELET
BASOS ABS: 0 10*3/uL (ref 0.0–0.1)
Basophils Relative: 0 %
Eosinophils Absolute: 0.5 10*3/uL (ref 0.0–0.7)
Eosinophils Relative: 4 %
HEMATOCRIT: 27.7 % — AB (ref 39.0–52.0)
HEMOGLOBIN: 8.4 g/dL — AB (ref 13.0–17.0)
LYMPHS ABS: 1.4 10*3/uL (ref 0.7–4.0)
LYMPHS PCT: 9 %
MCH: 25.4 pg — AB (ref 26.0–34.0)
MCHC: 30.3 g/dL (ref 30.0–36.0)
MCV: 83.7 fL (ref 78.0–100.0)
Monocytes Absolute: 1.3 10*3/uL — ABNORMAL HIGH (ref 0.1–1.0)
Monocytes Relative: 9 %
NEUTROS ABS: 11.6 10*3/uL — AB (ref 1.7–7.7)
Neutrophils Relative %: 78 %
Platelets: 423 10*3/uL — ABNORMAL HIGH (ref 150–400)
RBC: 3.31 MIL/uL — AB (ref 4.22–5.81)
RDW: 20.3 % — ABNORMAL HIGH (ref 11.5–15.5)
WBC: 14.8 10*3/uL — AB (ref 4.0–10.5)

## 2015-06-12 LAB — BASIC METABOLIC PANEL
ANION GAP: 8 (ref 5–15)
BUN: 15 mg/dL (ref 6–20)
CHLORIDE: 105 mmol/L (ref 101–111)
CO2: 28 mmol/L (ref 22–32)
Calcium: 8.3 mg/dL — ABNORMAL LOW (ref 8.9–10.3)
Creatinine, Ser: 0.45 mg/dL — ABNORMAL LOW (ref 0.61–1.24)
GFR calc Af Amer: >60 mL/min (ref >60)
GLUCOSE: 112 mg/dL — AB (ref 65–99)
POTASSIUM: 3.6 mmol/L (ref 3.5–5.1)
Sodium: 141 mmol/L (ref 135–145)

## 2015-06-12 MED ORDER — HEPARIN SOD (PORK) LOCK FLUSH 100 UNIT/ML IV SOLN
250.0000 [IU] | INTRAVENOUS | Status: DC | PRN
  Administered 2015-06-12: 500 [IU]

## 2015-06-12 MED ORDER — DOXYCYCLINE HYCLATE 100 MG PO TABS: 100.0000 mg | ORAL_TABLET | Freq: Two times a day (BID) | ORAL | Status: DC

## 2015-06-12 MED ORDER — DOXYCYCLINE HYCLATE 100 MG PO TABS
100.0000 mg | ORAL_TABLET | Freq: Two times a day (BID) | ORAL | Status: DC
  Administered 2015-06-12: 100 mg via ORAL
  Filled 2015-06-12: qty 1

## 2015-06-12 MED ORDER — PIPERACILLIN-TAZOBACTAM 3.375 G IVPB: 3.3750 g | Freq: Three times a day (TID) | INTRAVENOUS | Status: DC

## 2015-06-12 MED ORDER — ACETAMINOPHEN 325 MG PO TABS: 650.0000 mg | ORAL_TABLET | Freq: Four times a day (QID) | ORAL | Status: DC | PRN

## 2015-06-12 MED ORDER — COLLAGENASE 250 UNIT/GM EX OINT: TOPICAL_OINTMENT | Freq: Every day | CUTANEOUS | Status: DC

## 2015-06-12 NOTE — Care Management Note (Signed)
Case Management Note  Patient Details  Name: [name] [name] MRN: [id number] Date of Birth: 07/04/1965  Subjective/Objective:  Per MD/ID patient will not receive last dose iv zosyn tonight. Nurse may d/c after current iv abx infusion.                  Action/Plan:   Expected Discharge Date:                  Expected Discharge Plan:  [place]  In-House Referral:     Discharge planning Services  CM Consult  Post Acute Care Choice:  Home Health [name] active-HHRN-wound care) Choice offered to:  Patient  DME Arranged:    DME Agency:     HH Arranged:  RN, IV Antibiotics HH Agency:  [place]  Status of Service:  Completed, signed off  Medicare Important Message Given:  Yes Date Medicare IM Given:    Medicare IM give by:    Date Additional Medicare IM Given:    Additional Medicare Important Message give by:     If discussed at [place] of Stay Meetings, dates discussed:    Additional Comments:  Dessa Phi, RN 06/12/2015, 4:51 PM

## 2015-06-12 NOTE — Progress Notes (Signed)
Physical Therapy Hydrotherapy Treatment Note    06/12/15 1140  Subjective Assessment  Subjective Pt pleasant and without complaints.  Patient and Family Stated Goals agreed to wound care.  Date of Onset (present several months)  Prior Treatments HH  dressing changes  Evaluation and Treatment  Evaluation and Treatment Procedures Explained to Patient/Family Yes  Evaluation and Treatment Procedures agreed to  Pressure Ulcer 05/30/15 Stage II -  Partial thickness loss of dermis presenting as a shallow open ulcer with a red, pink wound bed without slough. pressure injury  Date First Assessed/Time First Assessed: 05/30/15 1800   Location: Other (Comment)  Location Orientation: (c) Left  Staging: Stage II -  Partial thickness loss of dermis presenting as a shallow open ulcer with a red, pink wound bed without slough.  Wo...  Dressing Type Moist to dry;ABD (Santyl, kerlix)  Dressing Changed (appeared saturated with ?urine)  Site / Wound Assessment Bleeding;Yellow;Red  Drainage Amount Moderate  Drainage Description Purulent;Serosanguineous  Treatment Hydrotherapy (Pulse lavage);Packing (Saline gauze) (santyl)  Wound / Incision (Open or Dehisced) 06/03/15 Other (Comment) Sacrum Other (Comment) large open wound on buttocks with 2 areas within that have slough.  Date First Assessed/Time First Assessed: 06/03/15 1140   Wound Type: (c) Other (Comment)  Location: Sacrum  Location Orientation: (c) Other (Comment)  Wound Description (Comments): large open wound on buttocks with 2 areas within that have slough.  Pr...  Dressing Type ABD;Moist to dry (santyl, kerlix)  Dressing Changed Changed  % Wound base Red or Granulating 80%  % Wound base Yellow 20%  % Wound base Other (Comment) (palpable bone)  Peri-wound Assessment Other (Comment) (maceration around testicles)  Drainage Amount Copious (dressing saturated in urine)  Drainage Description Serosanguineous (urine)  Treatment Hydrotherapy (Pulse  lavage);Packing (Saline gauze) (santyl)  Hydrotherapy  Pulsed lavage therapy - wound location # 1 is R hip location with slough, increased depth over greater trochanter area, #2 is L sacrum and a smaller patch of slough laterally and inferiorly to #2  Pulsed Lavage with Suction (psi) 8 psi  Pulsed Lavage with Suction - Normal Saline Used 1000 mL  Pulsed Lavage Tip Tip with splash shield  Wound Therapy - Assess/Plan/Recommendations  Wound Therapy - Clinical Statement Pt receiving hydrotherapy on trial basis to assist with removal of nonviable tissue.  Pt has had urine leakage around subrapubic catheter however placed clean dry linen after session.  Wound Therapy - Functional Problem List quadriplegia of longstanding  Factors Delaying/Impairing Wound Healing Altered sensation;Incontinence;Infection - systemic/local;Immobility;Multiple medical problems  Hydrotherapy Plan Patient/family education;Pulsatile lavage with suction (for a trial period.)  Wound Therapy - Frequency 3X / week  Wound Therapy - Current Recommendations Case manager/social work  Wound Therapy - Follow Up Recommendations Skilled nursing facility  Wound Plan trial of PLS  Wound Therapy Goals - Improve the function of patient's integumentary system by progressing the wound(s) through the phases of wound healing by:  Decrease Necrotic Tissue to 10  Decrease Necrotic Tissue - Progress Progressing toward goal  Increase Granulation Tissue to 90  Increase Granulation Tissue - Progress Progressing toward goal  Improve Drainage Characteristics Min  Improve Drainage Characteristics - Progress Progressing toward goal  Goals/treatment plan/discharge plan were made with and agreed upon by patient/family Yes  Time For Goal Achievement 2 weeks  Wound Therapy - Potential for Goals Poor  Time: [id number]  42 minutes  Carmelia Bake, PT, DPT 06/12/2015 Pager: [phone]

## 2015-06-12 NOTE — Progress Notes (Addendum)
[facility] for Infectious Disease    Date of Admission:  05/28/2015   Total days of antibiotics 16        Day 16piptazo           ID: [name] [name] is a 50 y.o. male with incomplete quadriplegia with chronic folly, and  Chronic decub ulses and osteo presenting with sepsis found to have providencia bacteremia Principal Problem:   Gram-negative bacteremia ([place]) Active Problems:   Quadriplegia ([place])   Leukocytosis   Sacral decubitus ulcer, stage IV ([name]   Complicated UTI (urinary tract infection)   OSA on CPAP   Chronic osteomyelitis, pelvic region and thigh ([place])   Decubitus ulcer of lower extremity, stage 2   Pressure ulcer of right upper back   History of MDR Pseudomonas aeruginosa infection   Anemia of chronic disease   Sepsis ([place])   Septic shock ([place])   Palliative care encounter   Infection, Pseudomonas    Subjective: Afebrile x 2 days.     Medications:  . baclofen  20 mg Oral QID  . collagenase   Topical Daily  . darifenacin  15 mg Oral Daily  . docusate sodium  200 mg Oral BID  . ferrous sulfate  325 mg Oral TID WC  . lactose free nutrition  237 mL Oral QID  . magnesium oxide  400 mg Oral Daily  . metoCLOPramide  10 mg Oral TID AC  . mirabegron ER  25 mg Oral Daily  . multivitamin with minerals  1 tablet Oral q morning - 10a  . pantoprazole  40 mg Oral BID AC  . piperacillin-tazobactam (ZOSYN)  IV  3.375 g Intravenous Q8H  . sodium chloride  500 mL Intravenous Once  . sodium chloride  3 mL Intravenous Q12H  . sucralfate  1 g Oral QID  . vancomycin  1,250 mg Intravenous Q12H  . vitamin C  500 mg Oral q morning - 10a  . zinc sulfate  220 mg Oral BID    Objective: Vital signs in last 24 hours: Temp:  [98.7 F (37.1 C)-99.4 F (37.4 C)] 98.7 F (37.1 C) (01/27 0634) Pulse Rate:  [95-116] 95 (01/27 0634) Resp:  [20-22] 20 (01/27 0634) BP: (112-149)/(63-93) 149/93 mmHg (01/27 0634) SpO2:  [96 %-100 %] 96 % (01/27 [id number]) Physical Exam    Constitutional: He is oriented to person, place, and time. He appears well-developed and well-nourished. No distress. Towel over his head listening to headphones HENT:  Mouth/Throat: Oropharynx is clear and moist. No oropharyngeal exudate.  Cardiovascular: Normal rate, regular rhythm and normal heart sounds. Exam reveals no gallop and no friction rub.  No murmur heard.  Pulmonary/Chest: Effort normal and breath sounds normal. No respiratory distress. He has no wheezes.  Abdominal: Soft. Bowel sounds are decreased. He exhibits mild distention Lymphadenopathy:  He has no cervical adenopathy.  Neurological: flaccid paralysis of lower extremities, partial movement of upper extremities Skin: wounds are wrapped Psychiatric: He has a normal mood and affect. His behavior is normal.      Lab Results  Recent Labs  06/11/15 0345 06/12/15 0410  WBC 16.5* 14.8*  HGB 8.3* 8.4*  HCT 27.3* 27.7*  NA 141 141  K 3.7 3.6  CL 104 105  CO2 28 28  BUN 14 15  CREATININE 0.49* 0.45*    Erythrocyte Sedimentation Rate     Component Value Date/Time   ESRSEDRATE 83* 05/29/2015 1710     Microbiology: Ur cx: 60,000 providencia nad  60,000 PsA Blood cx 1/2 on 1/13 + providencia S cefepime, ceftaz, pip/tazo, imi, ctx. R cefazolin, R FQ, R sulfa Wound cx: multiple organism  Assessment/Plan: Providencia bacteremia likely related to urinary source (cx+) = currently on day 16  with piptazo, had planned to extend to 21 days due to wound rather than bacteremia  Fevers = no new sources found. He has been on vancomycin, day 6. We will stop and switch over to doxycycline to treat wounds  Leukocytosis = wbc improving  Chronic wounds/decub ulcers/osteo = continue with current wound care plan. His wounds are very advanced and would not tolerate debridement and healing process for orthopedic/plastic evaluation. He has numerous GNR pathogen colonization that are becoming more resistant to treat. No oral  options. Will keep on piptazo for a total of 21 days, currently day 16. Will switch vanco to doxycycline in order to minimize risk of AKI.   Once he finishes 21 d course of piptazo, recommend he continues on doxycycline 100mg  BID, life long  Appreciated paliative care input to help with goals of care. Patient has advanced wounds that surgery felt would be inoperable thus need to focus on continued maintenance of wound care with intermittent needs ofr antibiotics at this point.  Dr. Linus Salmons available for questions over the weekend.   Baxter Flattery [facility] Cell: [phone] Pager: [phone]  06/12/2015, 12:31 PM

## 2015-06-12 NOTE — Care Management Note (Signed)
Case Management Note  Patient Details  Name: EDMAN KAVA MRN: [id number] Date of Birth: [birth year]  Subjective/Objective:  Spoke to [facility] rep Dara Lords will not able to give tonights iv zosyn dose due to timing-Staff Nurse here will give tonight's dose. [facility] is waiting on manual script for iv abx w/dose/freq/duration to fax to their pharmacy.  Nsg notified. Ambulance transp needed-CSW aware.                Action/Plan:d/c plan home w/HHC.   Expected Discharge Date:                  Expected Discharge Plan:  [place]  In-House Referral:     Discharge planning Services  CM Consult  Post Acute Care Choice:  Home Health [facility] active-HHRN-wound care) Choice offered to:  Patient  DME Arranged:    DME Agency:     HH Arranged:  RN [place] Agency:  [place]  Status of Service:  In process, will continue to follow  Medicare Important Message Given:  Yes Date Medicare IM Given:    Medicare IM give by:    Date Additional Medicare IM Given:    Additional Medicare Important Message give by:     If discussed at [place] of Stay Meetings, dates discussed:    Additional Comments:  Dessa Phi, RN 06/12/2015, 3:35 PM

## 2015-06-12 NOTE — Care Management Note (Signed)
Case Management Note  Patient Details  Name: [name] [name] MRN: [id number] Date of Birth: [birth year]  Subjective/Objective:   [facility] rep aware of HHC-RN-wound care orders, home iv abx-mgmnt,picc flushes, labs per protocal. Awaiting home iv abx manual script. HHRN will make home visit tomorrow if d/c this evening. Patient has appt to f/u w/wound care clinic next week.Ambulance transp needed-CSW aware.                  Action/Plan:d/c plan home w/HHC/iv abx.   Expected Discharge Date:                  Expected Discharge Plan:  [place]  In-House Referral:     Discharge planning Services  CM Consult  Post Acute Care Choice:  Home Health [facility] active-HHRN-wound care) Choice offered to:  Patient  DME Arranged:    DME Agency:     HH Arranged:  RN [place] Agency:  [place]  Status of Service:  In process, will continue to follow  Medicare Important Message Given:  Yes Date Medicare IM Given:    Medicare IM give by:    Date Additional Medicare IM Given:    Additional Medicare Important Message give by:     If discussed at [place] of Stay Meetings, dates discussed:    Additional Comments:  Dessa Phi, RN 06/12/2015, 2:55 PM

## 2015-06-12 NOTE — Progress Notes (Signed)
Pharmacy Antibiotic Follow-up Note  [name] [name] is a 50 y.o. year-old male admitted on 05/28/2015.  The patient is currently on day #15 of vancomycin and zosyn for bacteremia, chronic wounds/decub ulcers/osteo.  Assessment/Plan: - Last vancomycin trough level obtained on 1/25 was therapeutic at 16 (goal 15-20).  Continue vancomycin 1250 mg IV q12h and zosyn 3.375 gm IV q8h (infuse over 4 hours) - Per ID note, plan to treat with abx until beginning of Feb   Temp (24hrs), Avg:98.9 F (37.2 C), Min:98.7 F (37.1 C), Max:99.4 F (37.4 C)   Recent Labs Lab 06/08/15 0409 06/09/15 0350 06/10/15 0427 06/11/15 0345 06/12/15 0410  WBC 18.2* 17.0* 17.2* 16.5* 14.8*    Recent Labs Lab 06/08/15 0409 06/09/15 0350 06/10/15 0427 06/11/15 0345 06/12/15 0410  CREATININE 0.40* 0.44* 0.38* 0.49* 0.45*   Estimated Creatinine Clearance: 123.9 mL/min (by C-G formula based on Cr of 0.45).    Allergies  Allergen Reactions  . Ditropan [Oxybutynin] Other (See Comments)    hallucinations    Antimicrobials this admission: 1/13 >> levaquin >> 1/13 1/13 >> vanc >> 1/15, 1/22 >> Vanc >> 1/13 >> zosyn  MD >> 1/22 >> cipro(MD) >> 1/23  Levels/dose changes this admission: 1/15 0100 VT: 25 on vancomycin 1g q8h 1/25 0930 VT: 90mcg/ml on 1250mg  q12h (prior to 5th dose)  Microbiology results: 1/13 blood: 1 of 2 sets growing Providencia stuartii, S to Zosyn (and cefepime, ceftriaxone among others) 1/13 urine: suggest recollection, multiple species 1/13 cdiff neg/neg 1/15 MRSA PCR nasal screen: negative 1/13 MRSA nasal culture: negative 1/15 sacral wound cx: multiple organisms (non-predominant) 1/15 urine: 60K pseudomonas (S cefepime, ceftaz, gent, pip/tazo), 60K providencia (S ceftriaxone, imi, pip/tazo) 1/23 blood: NGTD  Thank you for allowing pharmacy to be a part of this patient's care.  Lynelle Doctor PharmD 06/12/2015 10:24 AM

## 2015-06-12 NOTE — Care Management Note (Signed)
Case Management Note  Patient Details  Name: RUSHI ZINGG MRN: [id number] Date of Birth: [birth year]  Subjective/Objective:   Per [facility] rep Dara Lords can provide HHRN-wound care [id number], heel, & ostomy as per WOC note-1x a week/then sister does the days home nurse is not there.  Hydrotherapy is NOT a HHC service they can [facility] service is noted on a CM treatment sticky note on 06/05/15.  HHRN can provide iv abx management. Will need iv abx manual script, HHC specific orders for RN-iv abx,picc flushes,lab draws. Patient has been denied LTACH-services can be provided @ SNF level or @ home.  Patient is declining to go to a SNF. Patient has a primary caregiver-sister who has been doing dsg changes prior to admission.  Ambulance transp will be needed.                  Action/Plan:d/c plan home w/HHC.   Expected Discharge Date:                  Expected Discharge Plan:  [place]  In-House Referral:     Discharge planning Services  CM Consult  Post Acute Care Choice:  Home Health [facility] active-HHRN-wound care) Choice offered to:  Patient  DME Arranged:    DME Agency:     HH Arranged:  RN [place] Agency:  [place]  Status of Service:  In process, will continue to follow  Medicare Important Message Given:  Yes Date Medicare IM Given:    Medicare IM give by:    Date Additional Medicare IM Given:    Additional Medicare Important Message give by:     If discussed at [place] of Stay Meetings, dates discussed:    Additional Comments:  Dessa Phi, RN 06/12/2015, 12:12 PM
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
## 2015-06-12 NOTE — Progress Notes (Signed)
[facility]  Patient Status:   Active pt with [facility] prior to this admissioin  [facility] is providing the following services: HHRN and home infusion pharmacy for home IV ABX.  [facility] is prepared for pt DC tonight.  I spoke with [name], patients caregiver. earlier today regarding DC plan for later today and for her to administer next dose of IV ABX around 10 PM tonight.  Caregiver will adjust dose times as needed based on when pt arrives home. [name] has administered IV ABX numerous times in the past.  [facility] will see pt in the home tomorrow for resumption of Home Care services.   If patient discharges after hours, please call [phone].   Larry Sierras 06/12/2015, 9:13 PM

## 2015-06-12 NOTE — Progress Notes (Signed)
CSW was notified by nurse that patient is up for discharge and needs transportation from [place].   CSW contacted non-emergency line and requested patient be picked up EMS.  CSW made nurse aware.  Willette Brace [id number] ED CSW 06/12/2015 6:26 PM

## 2015-06-12 NOTE — Care Management Note (Signed)
Case Management Note  Patient Details  Name: [name] [name] MRN: [id number] Date of Birth: 1965/06/14  Subjective/Objective: [facility]  has the manual scripts to send to their pharmacy.  No further d/c needs.Ambulance transp-CSW.                  Action/Plan:d/c home w/[facility]HC.   Expected Discharge Date:                  Expected Discharge Plan:  [place]  In-House Referral:     Discharge planning Services  CM Consult  Post Acute Care Choice:  Home Health [facility] active-HHRN-wound care) Choice offered to:  Patient  DME Arranged:    DME Agency:     HH Arranged:  RN, IV Antibiotics HH Agency:  [facility]  Status of Service:  Completed, signed off  Medicare Important Message Given:  Yes Date Medicare IM Given:    Medicare IM give by:    Date Additional Medicare IM Given:    Additional Medicare Important Message give by:     If discussed at [place] of Stay Meetings, dates discussed:    Additional Comments:  Dessa Phi, RN 06/12/2015, 4:38 PM

## 2015-06-12 NOTE — Progress Notes (Signed)
Have had many discussions with [name] [name] and family. Helped them complete Advance Directive. He is requesting full aggressive care and care limitations ONLY if persistent vegetative state. Palliative will sign off. Please call for further palliative assistance [phone].  Vinie Sill, NP Palliative Medicine Team Pager # [phone] (M-F 8a-5p) Team Phone # [phone] (Nights/Weekends)

## 2015-06-13 NOTE — Progress Notes (Signed)
Patient was discharged, and taken home by [facility]. Patient's vitals were WNL, All of patient's belongings were in bags. Prescriptions were given to patient and RN went through discharge papers with the patient.

## 2015-06-14 LAB — CULTURE, BLOOD (ROUTINE X 2): CULTURE: NO GROWTH

## 2015-06-22 IMAGING — XA IR FLUORO GUIDE CV LINE*L*
1 series · 1 of 1 positions shown · non-contrast
Comparison: none

INDICATION: Poor venous access

[Series 300: line placements · 1 of 1 slices shown]
[im 1/1]
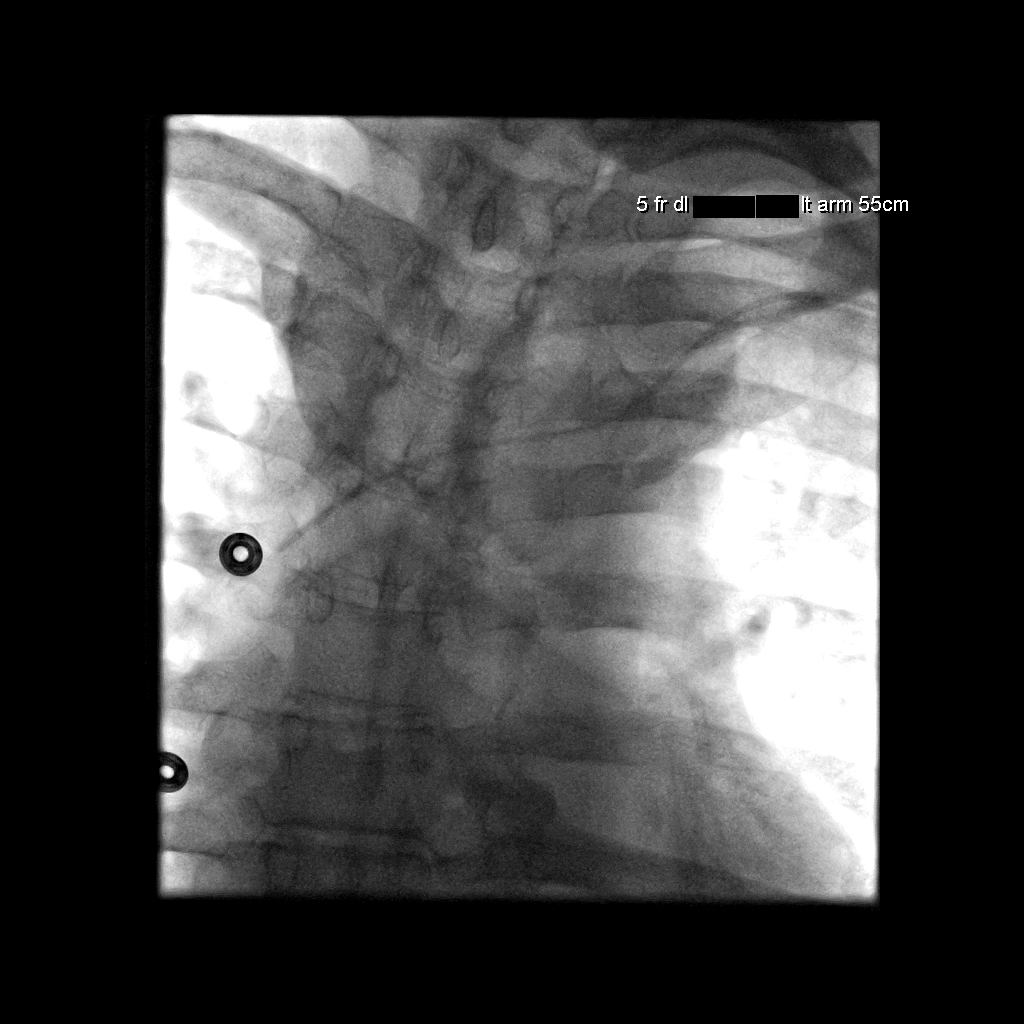

[1 of 1 positions shown; findings below may reference images not displayed]

ULTRASOUND AND FLUORSCOPIC GUIDED PICC LINE INSERTION

Intravenous Medications: None

Contrast: None

Fluoroscopy Time:  1 minute, 18-seconds.

Complications: None immediate

Technique / Findings:

The procedure, risks, benefits, and alternatives were explained to
the patient and informed written consent was obtained.  A timeout
was performed prior to the initiation of the procedure.

Initially, the right upper extremity was prepped with chlorhexidine
in a sterile fashion, and a sterile drape was applied covering the
operative field.  Maximum barrier sterile technique with sterile
gowns and gloves were used for the procedure.  A timeout was
performed prior to the initiation of the procedure.  Local
anesthesia was provided with 1% lidocaine.

Ultrasound scanning failed to delineate a patent cephalic, brachial
or basilic vein within the right upper extremity.  Limited attempt
was made to cannulate a hypertrophied venous collateral within the
right upper arm however this ultimately proved unsuccessful.

As such, the left upper extremity was prepped and draped in the
usual sterile fashion.  Ultrasound scanning failed to delineate
pain and left-sided basilic or cephalic vein.  The left brachial
vein was noted to be atretic, and likely only amendable to access
at the level of the antecubital fossa. As such, under direct
ultrasound guidance, the leftbrachialvein was accessed with a
micropuncture kit after the overlying soft tissues were
anesthetized with 1% lidocaine.  An ultrasound image was saved for
documentation purposes.  A guidewire was advanced to the level of
the superior caval-atrial junction for measurement purposes.  A
peel-away sheath was placed and a 55 cm, 5 French, dual lumen was
inserted to level of the superior aspect of the SVC (maximum
catheter length was reached at this location).  A post procedure
spot fluoroscopic was obtained.  The catheter easily aspirated and
flushed and was sutured in place.  A dressing was placed.  The
patient tolerated the procedure well without immediate post
procedural complication.
IMPRESSION: 1.  Successful ultrasound and fluoroscopic guided placement of a
left brachial vein approach, 55 cm, 5 French,dual lumen PICC with
tip terminating within the superior aspect of the SVC.  The
catheter cannot be advanced further as this is the maximum length
of the PICC line. The PICC line is ready for immediate use.

2.  Nonvisualization of a patent right brachial, basilic or
cephalic vein.

PLAN:

This patient will likely require jugular venous access in the
future.

## 2015-06-22 IMAGING — CR DG CHEST 2V
4 series · 4 of 4 positions shown · non-contrast
Comparison: 09/01/2012

CLINICAL DATA: Hypertension, diabetes, asthma, elevated white count

CHEST - 2 VIEW

[w chest lat (1 of 2)]
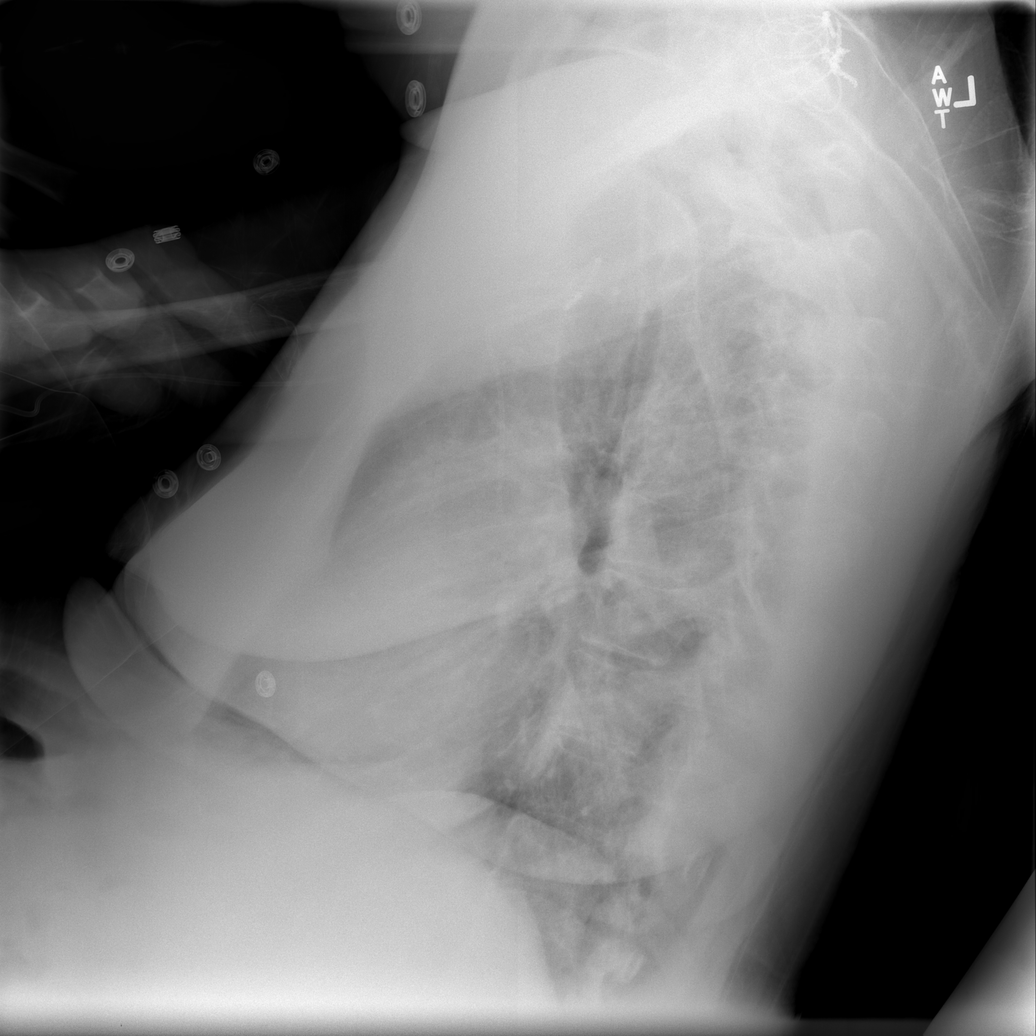

[w chest lat (2 of 2)]
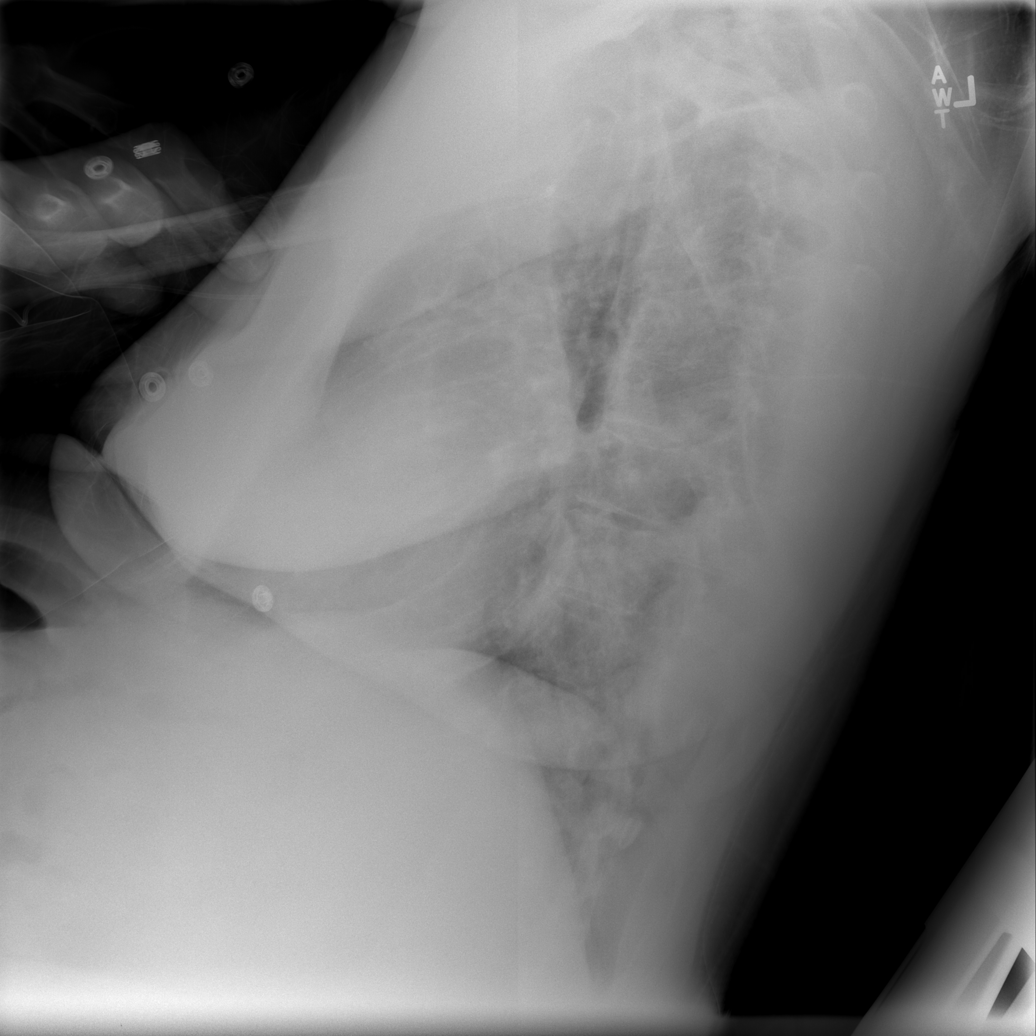

[view not recorded (1 of 2)]
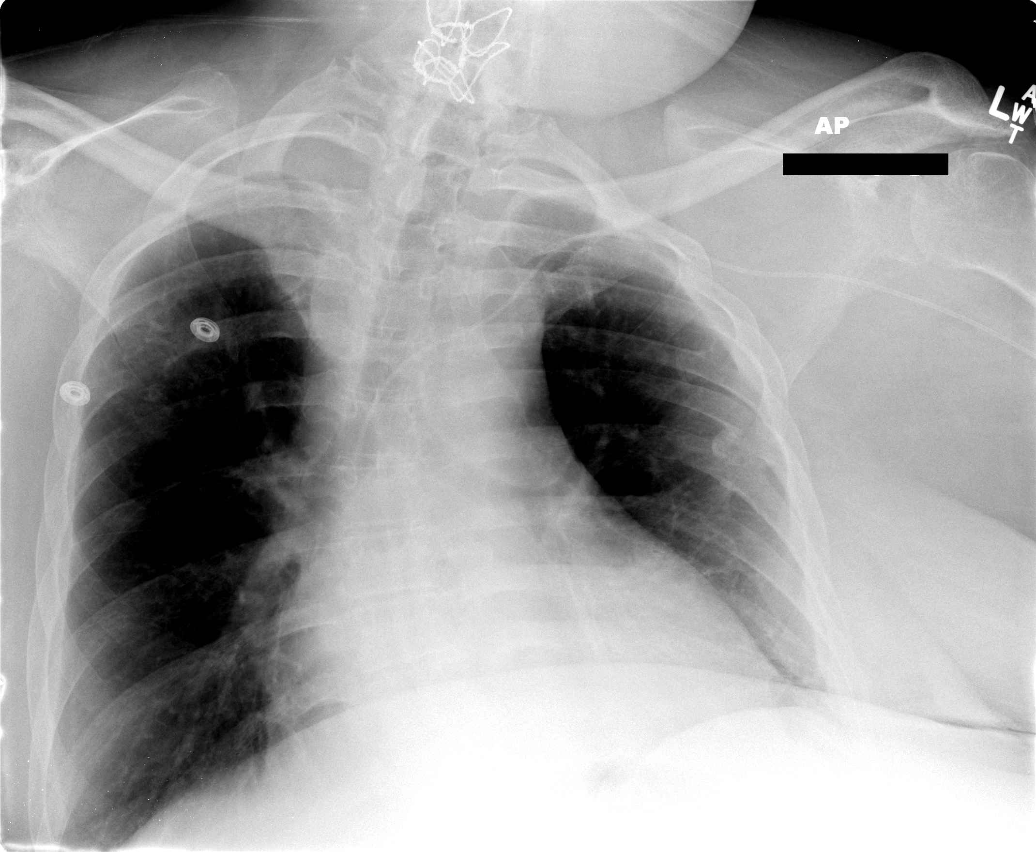

[view not recorded (2 of 2)]
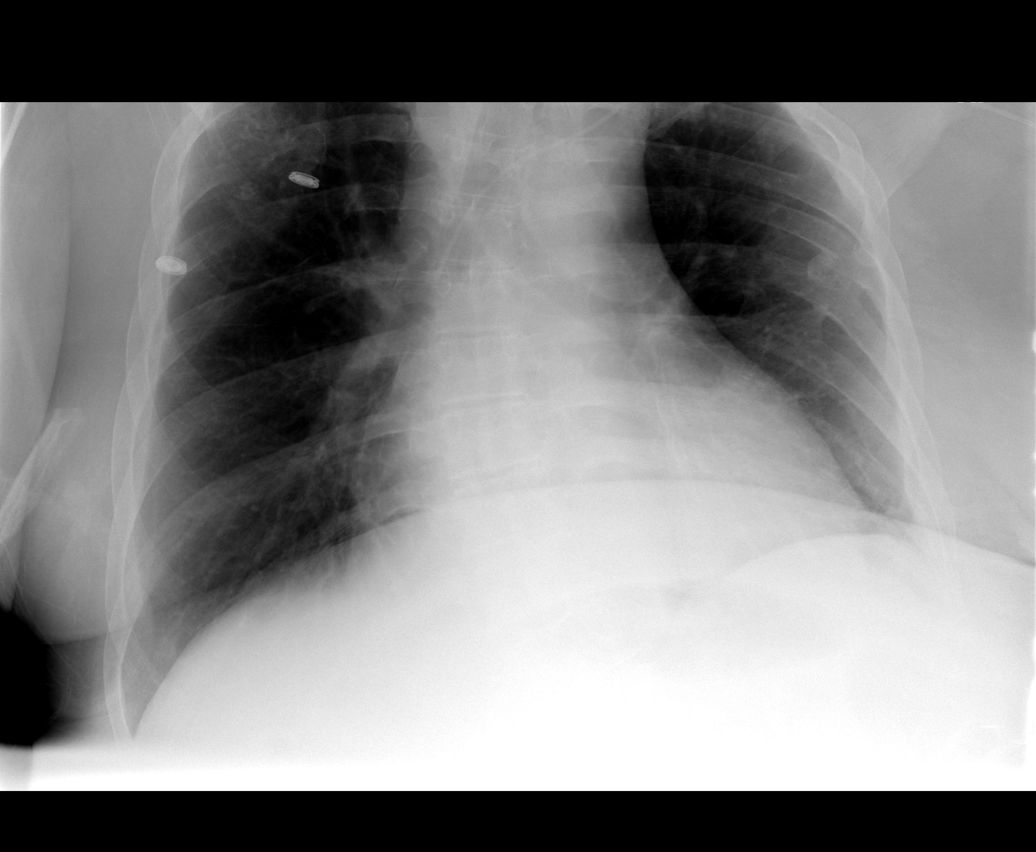

[4 of 4 positions shown; findings below may reference images not displayed]

FINDINGS: Limited exam because of obese body habitus and
positioning.  Left PICC line tip proximal SVC.  Stable cardiomegaly
without CHF or pneumonia.  No focal collapse consolidation.
Limited lateral views because of overlapping soft tissue shadows
and arm positions.  Chronic apical scarring bilaterally.
IMPRESSION: Limited exam but no gross interval change or acute process

## 2015-06-24 DIAGNOSIS — N312 Flaccid neuropathic bladder, not elsewhere classified: Secondary | ICD-10-CM | POA: Diagnosis not present

## 2015-06-25 DIAGNOSIS — M868X0 Other osteomyelitis, multiple sites: Secondary | ICD-10-CM | POA: Diagnosis not present

## 2015-06-25 DIAGNOSIS — A4152 Sepsis due to Pseudomonas: Secondary | ICD-10-CM | POA: Diagnosis not present

## 2015-06-25 DIAGNOSIS — Z09 Encounter for follow-up examination after completed treatment for conditions other than malignant neoplasm: Secondary | ICD-10-CM | POA: Diagnosis not present

## 2015-06-26 ENCOUNTER — Encounter (HOSPITAL_BASED_OUTPATIENT_CLINIC_OR_DEPARTMENT_OTHER): Payer: Medicare Other | Attending: Internal Medicine

## 2015-06-26 DIAGNOSIS — G8250 Quadriplegia, unspecified: Secondary | ICD-10-CM | POA: Insufficient documentation

## 2015-06-26 DIAGNOSIS — L89154 Pressure ulcer of sacral region, stage 4: Secondary | ICD-10-CM | POA: Diagnosis not present

## 2015-06-26 DIAGNOSIS — L89892 Pressure ulcer of other site, stage 2: Secondary | ICD-10-CM | POA: Diagnosis not present

## 2015-06-26 DIAGNOSIS — M8668 Other chronic osteomyelitis, other site: Secondary | ICD-10-CM | POA: Diagnosis not present

## 2015-06-26 DIAGNOSIS — L89890 Pressure ulcer of other site, unstageable: Secondary | ICD-10-CM | POA: Diagnosis not present

## 2015-06-26 DIAGNOSIS — G4733 Obstructive sleep apnea (adult) (pediatric): Secondary | ICD-10-CM | POA: Insufficient documentation

## 2015-06-26 DIAGNOSIS — D649 Anemia, unspecified: Secondary | ICD-10-CM | POA: Diagnosis not present

## 2015-06-26 DIAGNOSIS — L89894 Pressure ulcer of other site, stage 4: Secondary | ICD-10-CM | POA: Diagnosis not present

## 2015-06-26 DIAGNOSIS — L89520 Pressure ulcer of left ankle, unstageable: Secondary | ICD-10-CM | POA: Diagnosis not present

## 2015-06-26 DIAGNOSIS — X35XXXA Volcanic eruption, initial encounter: Secondary | ICD-10-CM | POA: Diagnosis not present

## 2015-06-26 DIAGNOSIS — I10 Essential (primary) hypertension: Secondary | ICD-10-CM | POA: Insufficient documentation

## 2015-06-26 DIAGNOSIS — L89324 Pressure ulcer of left buttock, stage 4: Secondary | ICD-10-CM | POA: Insufficient documentation

## 2015-06-26 DIAGNOSIS — G40909 Epilepsy, unspecified, not intractable, without status epilepticus: Secondary | ICD-10-CM | POA: Diagnosis not present

## 2015-06-26 DIAGNOSIS — L89103 Pressure ulcer of unspecified part of back, stage 3: Secondary | ICD-10-CM | POA: Diagnosis not present

## 2015-07-01 ENCOUNTER — Encounter: Payer: Self-pay | Admitting: Infectious Diseases

## 2015-07-01 ENCOUNTER — Ambulatory Visit (INDEPENDENT_AMBULATORY_CARE_PROVIDER_SITE_OTHER): Payer: Medicare Other | Admitting: Infectious Diseases

## 2015-07-01 VITALS — BP 127/85 | HR 117 | Temp 98.1°F

## 2015-07-01 DIAGNOSIS — E43 Unspecified severe protein-calorie malnutrition: Secondary | ICD-10-CM

## 2015-07-01 DIAGNOSIS — A419 Sepsis, unspecified organism: Secondary | ICD-10-CM | POA: Diagnosis not present

## 2015-07-01 DIAGNOSIS — N342 Other urethritis: Secondary | ICD-10-CM | POA: Diagnosis not present

## 2015-07-01 DIAGNOSIS — L89154 Pressure ulcer of sacral region, stage 4: Secondary | ICD-10-CM | POA: Diagnosis not present

## 2015-07-01 DIAGNOSIS — R6521 Severe sepsis with septic shock: Secondary | ICD-10-CM

## 2015-07-01 MED ORDER — FOSFOMYCIN TROMETHAMINE 3 G PO PACK: 3.0000 g | PACK | Freq: Once | ORAL | Status: DC

## 2015-07-01 NOTE — Assessment & Plan Note (Signed)
Will write him prn fosfomycin.

## 2015-07-01 NOTE — Assessment & Plan Note (Signed)
Will get him with nutrition Will check CBC, CMP, Alb

## 2015-07-01 NOTE — Assessment & Plan Note (Signed)
resolved 
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
## 2015-07-01 NOTE — Assessment & Plan Note (Addendum)
Need to get him nutrition He is correcting his mattress Will continue doxy indefinitely WOC f/u Will give him prn fosfomycin Will see him back in 3 months

## 2015-07-02 ENCOUNTER — Ambulatory Visit: Payer: Medicare Other | Admitting: Infectious Diseases

## 2015-07-02 LAB — CBC
HCT: 41.1 % (ref 39.0–52.0)
HEMOGLOBIN: 13 g/dL (ref 13.0–17.0)
MCH: 25.2 pg — AB (ref 26.0–34.0)
MCHC: 31.6 g/dL (ref 30.0–36.0)
MCV: 79.7 fL (ref 78.0–100.0)
MPV: 9.5 fL (ref 8.6–12.4)
Platelets: 640 10*3/uL — ABNORMAL HIGH (ref 150–400)
RBC: 5.16 MIL/uL (ref 4.22–5.81)
RDW: 18.1 % — ABNORMAL HIGH (ref 11.5–15.5)
WBC: 20.8 10*3/uL — ABNORMAL HIGH (ref 4.0–10.5)

## 2015-07-02 LAB — COMPREHENSIVE METABOLIC PANEL
ALT: 26 U/L (ref 9–46)
AST: 27 U/L (ref 10–40)
Albumin: 3.5 g/dL — ABNORMAL LOW (ref 3.6–5.1)
Alkaline Phosphatase: 112 U/L (ref 40–115)
BUN: 23 mg/dL (ref 7–25)
CO2: 21 mmol/L (ref 20–31)
Calcium: 9.2 mg/dL (ref 8.6–10.3)
Chloride: 102 mmol/L (ref 98–110)
Creat: 0.57 mg/dL — ABNORMAL LOW (ref 0.60–1.35)
Glucose, Bld: 105 mg/dL — ABNORMAL HIGH (ref 65–99)
Potassium: 5.4 mmol/L — ABNORMAL HIGH (ref 3.5–5.3)
Sodium: 137 mmol/L (ref 135–146)
Total Bilirubin: 0.3 mg/dL (ref 0.2–1.2)
Total Protein: 8.1 g/dL (ref 6.1–8.1)

## 2015-07-07 ENCOUNTER — Inpatient Hospital Stay (HOSPITAL_COMMUNITY)
Admission: EM | Admit: 2015-07-07 | Discharge: 2015-07-11 | DRG: 871 | Disposition: A | Discharge status: Home Health | Payer: Medicare Other | Attending: Internal Medicine | Admitting: Internal Medicine

## 2015-07-07 ENCOUNTER — Encounter (HOSPITAL_COMMUNITY): Payer: Self-pay

## 2015-07-07 ENCOUNTER — Emergency Department (HOSPITAL_COMMUNITY): Payer: Medicare Other

## 2015-07-07 DIAGNOSIS — Z452 Encounter for adjustment and management of vascular access device: Secondary | ICD-10-CM | POA: Diagnosis not present

## 2015-07-07 DIAGNOSIS — R6520 Severe sepsis without septic shock: Secondary | ICD-10-CM | POA: Diagnosis present

## 2015-07-07 DIAGNOSIS — N390 Urinary tract infection, site not specified: Secondary | ICD-10-CM | POA: Diagnosis present

## 2015-07-07 DIAGNOSIS — N179 Acute kidney failure, unspecified: Secondary | ICD-10-CM | POA: Diagnosis not present

## 2015-07-07 DIAGNOSIS — Z79899 Other long term (current) drug therapy: Secondary | ICD-10-CM

## 2015-07-07 DIAGNOSIS — Z933 Colostomy status: Secondary | ICD-10-CM

## 2015-07-07 DIAGNOSIS — T07XXXA Unspecified multiple injuries, initial encounter: Secondary | ICD-10-CM

## 2015-07-07 DIAGNOSIS — E872 Acidosis: Secondary | ICD-10-CM | POA: Diagnosis present

## 2015-07-07 DIAGNOSIS — E43 Unspecified severe protein-calorie malnutrition: Secondary | ICD-10-CM | POA: Diagnosis present

## 2015-07-07 DIAGNOSIS — R579 Shock, unspecified: Secondary | ICD-10-CM | POA: Diagnosis not present

## 2015-07-07 DIAGNOSIS — L89520 Pressure ulcer of left ankle, unstageable: Secondary | ICD-10-CM | POA: Diagnosis present

## 2015-07-07 DIAGNOSIS — Z8701 Personal history of pneumonia (recurrent): Secondary | ICD-10-CM

## 2015-07-07 DIAGNOSIS — I10 Essential (primary) hypertension: Secondary | ICD-10-CM | POA: Diagnosis present

## 2015-07-07 DIAGNOSIS — Z9989 Dependence on other enabling machines and devices: Secondary | ICD-10-CM

## 2015-07-07 DIAGNOSIS — R7989 Other specified abnormal findings of blood chemistry: Secondary | ICD-10-CM | POA: Diagnosis present

## 2015-07-07 DIAGNOSIS — L89890 Pressure ulcer of other site, unstageable: Secondary | ICD-10-CM | POA: Diagnosis present

## 2015-07-07 DIAGNOSIS — L89623 Pressure ulcer of left heel, stage 3: Secondary | ICD-10-CM | POA: Diagnosis present

## 2015-07-07 DIAGNOSIS — Z87440 Personal history of urinary (tract) infections: Secondary | ICD-10-CM

## 2015-07-07 DIAGNOSIS — Z8711 Personal history of peptic ulcer disease: Secondary | ICD-10-CM

## 2015-07-07 DIAGNOSIS — A419 Sepsis, unspecified organism: Principal | ICD-10-CM | POA: Diagnosis present

## 2015-07-07 DIAGNOSIS — D649 Anemia, unspecified: Secondary | ICD-10-CM

## 2015-07-07 DIAGNOSIS — L89154 Pressure ulcer of sacral region, stage 4: Secondary | ICD-10-CM | POA: Diagnosis not present

## 2015-07-07 DIAGNOSIS — M86659 Other chronic osteomyelitis, unspecified thigh: Secondary | ICD-10-CM | POA: Diagnosis present

## 2015-07-07 DIAGNOSIS — G40909 Epilepsy, unspecified, not intractable, without status epilepticus: Secondary | ICD-10-CM | POA: Diagnosis present

## 2015-07-07 DIAGNOSIS — L89610 Pressure ulcer of right heel, unstageable: Secondary | ICD-10-CM | POA: Diagnosis present

## 2015-07-07 DIAGNOSIS — M4628 Osteomyelitis of vertebra, sacral and sacrococcygeal region: Secondary | ICD-10-CM | POA: Diagnosis present

## 2015-07-07 DIAGNOSIS — D75838 Other thrombocytosis: Secondary | ICD-10-CM | POA: Diagnosis present

## 2015-07-07 DIAGNOSIS — J9610 Chronic respiratory failure, unspecified whether with hypoxia or hypercapnia: Secondary | ICD-10-CM | POA: Diagnosis not present

## 2015-07-07 DIAGNOSIS — Z888 Allergy status to other drugs, medicaments and biological substances status: Secondary | ICD-10-CM

## 2015-07-07 DIAGNOSIS — G4733 Obstructive sleep apnea (adult) (pediatric): Secondary | ICD-10-CM | POA: Diagnosis present

## 2015-07-07 DIAGNOSIS — Z9359 Other cystostomy status: Secondary | ICD-10-CM

## 2015-07-07 DIAGNOSIS — D7589 Other specified diseases of blood and blood-forming organs: Secondary | ICD-10-CM | POA: Diagnosis present

## 2015-07-07 DIAGNOSIS — T1490 Injury, unspecified: Secondary | ICD-10-CM | POA: Diagnosis not present

## 2015-07-07 DIAGNOSIS — G8250 Quadriplegia, unspecified: Secondary | ICD-10-CM | POA: Diagnosis not present

## 2015-07-07 DIAGNOSIS — R000 Tachycardia, unspecified: Secondary | ICD-10-CM | POA: Diagnosis not present

## 2015-07-07 DIAGNOSIS — I739 Peripheral vascular disease, unspecified: Secondary | ICD-10-CM | POA: Diagnosis present

## 2015-07-07 DIAGNOSIS — F329 Major depressive disorder, single episode, unspecified: Secondary | ICD-10-CM | POA: Diagnosis present

## 2015-07-07 DIAGNOSIS — J90 Pleural effusion, not elsewhere classified: Secondary | ICD-10-CM | POA: Diagnosis not present

## 2015-07-07 DIAGNOSIS — M8660 Other chronic osteomyelitis, unspecified site: Secondary | ICD-10-CM | POA: Diagnosis present

## 2015-07-07 DIAGNOSIS — E611 Iron deficiency: Secondary | ICD-10-CM | POA: Diagnosis present

## 2015-07-07 DIAGNOSIS — E876 Hypokalemia: Secondary | ICD-10-CM | POA: Diagnosis present

## 2015-07-07 DIAGNOSIS — Z803 Family history of malignant neoplasm of breast: Secondary | ICD-10-CM

## 2015-07-07 DIAGNOSIS — T148XXA Other injury of unspecified body region, initial encounter: Secondary | ICD-10-CM

## 2015-07-07 DIAGNOSIS — I517 Cardiomegaly: Secondary | ICD-10-CM | POA: Diagnosis not present

## 2015-07-07 DIAGNOSIS — Z833 Family history of diabetes mellitus: Secondary | ICD-10-CM

## 2015-07-07 DIAGNOSIS — L089 Local infection of the skin and subcutaneous tissue, unspecified: Secondary | ICD-10-CM

## 2015-07-07 DIAGNOSIS — L8990 Pressure ulcer of unspecified site, unspecified stage: Secondary | ICD-10-CM | POA: Diagnosis present

## 2015-07-07 DIAGNOSIS — R112 Nausea with vomiting, unspecified: Secondary | ICD-10-CM | POA: Diagnosis not present

## 2015-07-07 DIAGNOSIS — D638 Anemia in other chronic diseases classified elsewhere: Secondary | ICD-10-CM | POA: Diagnosis present

## 2015-07-07 DIAGNOSIS — K219 Gastro-esophageal reflux disease without esophagitis: Secondary | ICD-10-CM | POA: Diagnosis present

## 2015-07-07 MED ORDER — VANCOMYCIN HCL IN DEXTROSE 1-5 GM/200ML-% IV SOLN
1000.0000 mg | Freq: Once | INTRAVENOUS | Status: AC
  Administered 2015-07-08: 1000 mg via INTRAVENOUS
  Filled 2015-07-07: qty 200

## 2015-07-07 MED ORDER — SODIUM CHLORIDE 0.9 % IV BOLUS (SEPSIS)
500.0000 mL | INTRAVENOUS | Status: AC
  Administered 2015-07-08: 500 mL via INTRAVENOUS

## 2015-07-07 MED ORDER — SODIUM CHLORIDE 0.9 % IV BOLUS (SEPSIS)
1000.0000 mL | Freq: Once | INTRAVENOUS | Status: AC
  Administered 2015-07-08: 1000 mL via INTRAVENOUS

## 2015-07-07 MED ORDER — PIPERACILLIN-TAZOBACTAM 3.375 G IVPB
3.3750 g | Freq: Once | INTRAVENOUS | Status: AC
  Administered 2015-07-08: 3.375 g via INTRAVENOUS
  Filled 2015-07-07: qty 50

## 2015-07-07 NOTE — ED Notes (Signed)
Per [facility], patient c/o N/V since today at 1600.  Patient c/o being hot.  Patient is a quadrapalegic with HX of HTN, Per EMS patient has wounds to her right leg/buttock/ and foot.   Per EMS patient vomited x4 today.  VS 136/93 HR 140 CBG 165, 97 O2.

## 2015-07-07 NOTE — ED Notes (Signed)
IV team consult placed.  Patient difficult stick.  Need blood

## 2015-07-07 NOTE — ED Notes (Signed)
Bed: [id number] Expected date:  Expected time:  Means of arrival:  Comments: Nausea, vomiting
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
## 2015-07-08 ENCOUNTER — Emergency Department (HOSPITAL_COMMUNITY): Payer: Medicare Other

## 2015-07-08 DIAGNOSIS — N179 Acute kidney failure, unspecified: Secondary | ICD-10-CM | POA: Diagnosis present

## 2015-07-08 DIAGNOSIS — G8250 Quadriplegia, unspecified: Secondary | ICD-10-CM | POA: Diagnosis present

## 2015-07-08 DIAGNOSIS — T07XXXA Unspecified multiple injuries, initial encounter: Secondary | ICD-10-CM

## 2015-07-08 DIAGNOSIS — R112 Nausea with vomiting, unspecified: Secondary | ICD-10-CM | POA: Diagnosis present

## 2015-07-08 DIAGNOSIS — Z79899 Other long term (current) drug therapy: Secondary | ICD-10-CM | POA: Diagnosis not present

## 2015-07-08 DIAGNOSIS — L89520 Pressure ulcer of left ankle, unstageable: Secondary | ICD-10-CM | POA: Diagnosis present

## 2015-07-08 DIAGNOSIS — M8660 Other chronic osteomyelitis, unspecified site: Secondary | ICD-10-CM | POA: Diagnosis present

## 2015-07-08 DIAGNOSIS — N390 Urinary tract infection, site not specified: Secondary | ICD-10-CM

## 2015-07-08 DIAGNOSIS — Z9359 Other cystostomy status: Secondary | ICD-10-CM

## 2015-07-08 DIAGNOSIS — M4628 Osteomyelitis of vertebra, sacral and sacrococcygeal region: Secondary | ICD-10-CM | POA: Diagnosis present

## 2015-07-08 DIAGNOSIS — J90 Pleural effusion, not elsewhere classified: Secondary | ICD-10-CM | POA: Diagnosis not present

## 2015-07-08 DIAGNOSIS — I739 Peripheral vascular disease, unspecified: Secondary | ICD-10-CM

## 2015-07-08 DIAGNOSIS — J9610 Chronic respiratory failure, unspecified whether with hypoxia or hypercapnia: Secondary | ICD-10-CM | POA: Diagnosis present

## 2015-07-08 DIAGNOSIS — Z87440 Personal history of urinary (tract) infections: Secondary | ICD-10-CM | POA: Diagnosis not present

## 2015-07-08 DIAGNOSIS — L89890 Pressure ulcer of other site, unstageable: Secondary | ICD-10-CM | POA: Diagnosis present

## 2015-07-08 DIAGNOSIS — E43 Unspecified severe protein-calorie malnutrition: Secondary | ICD-10-CM | POA: Diagnosis present

## 2015-07-08 DIAGNOSIS — E611 Iron deficiency: Secondary | ICD-10-CM | POA: Diagnosis present

## 2015-07-08 DIAGNOSIS — T148XXA Other injury of unspecified body region, initial encounter: Secondary | ICD-10-CM

## 2015-07-08 DIAGNOSIS — G4733 Obstructive sleep apnea (adult) (pediatric): Secondary | ICD-10-CM

## 2015-07-08 DIAGNOSIS — E876 Hypokalemia: Secondary | ICD-10-CM | POA: Diagnosis present

## 2015-07-08 DIAGNOSIS — L89623 Pressure ulcer of left heel, stage 3: Secondary | ICD-10-CM | POA: Diagnosis present

## 2015-07-08 DIAGNOSIS — Z8701 Personal history of pneumonia (recurrent): Secondary | ICD-10-CM | POA: Diagnosis not present

## 2015-07-08 DIAGNOSIS — Z8711 Personal history of peptic ulcer disease: Secondary | ICD-10-CM | POA: Diagnosis not present

## 2015-07-08 DIAGNOSIS — R6520 Severe sepsis without septic shock: Secondary | ICD-10-CM | POA: Diagnosis present

## 2015-07-08 DIAGNOSIS — R7989 Other specified abnormal findings of blood chemistry: Secondary | ICD-10-CM

## 2015-07-08 DIAGNOSIS — D7589 Other specified diseases of blood and blood-forming organs: Secondary | ICD-10-CM | POA: Diagnosis present

## 2015-07-08 DIAGNOSIS — F329 Major depressive disorder, single episode, unspecified: Secondary | ICD-10-CM | POA: Diagnosis present

## 2015-07-08 DIAGNOSIS — A419 Sepsis, unspecified organism: Secondary | ICD-10-CM | POA: Diagnosis not present

## 2015-07-08 DIAGNOSIS — T1490 Injury, unspecified: Secondary | ICD-10-CM | POA: Diagnosis not present

## 2015-07-08 DIAGNOSIS — I10 Essential (primary) hypertension: Secondary | ICD-10-CM | POA: Diagnosis present

## 2015-07-08 DIAGNOSIS — K219 Gastro-esophageal reflux disease without esophagitis: Secondary | ICD-10-CM | POA: Diagnosis present

## 2015-07-08 DIAGNOSIS — D638 Anemia in other chronic diseases classified elsewhere: Secondary | ICD-10-CM | POA: Diagnosis present

## 2015-07-08 DIAGNOSIS — Z888 Allergy status to other drugs, medicaments and biological substances status: Secondary | ICD-10-CM | POA: Diagnosis not present

## 2015-07-08 DIAGNOSIS — Z452 Encounter for adjustment and management of vascular access device: Secondary | ICD-10-CM | POA: Diagnosis not present

## 2015-07-08 DIAGNOSIS — L89154 Pressure ulcer of sacral region, stage 4: Secondary | ICD-10-CM | POA: Diagnosis present

## 2015-07-08 DIAGNOSIS — T148 Other injury of unspecified body region: Secondary | ICD-10-CM

## 2015-07-08 DIAGNOSIS — R579 Shock, unspecified: Secondary | ICD-10-CM | POA: Diagnosis present

## 2015-07-08 DIAGNOSIS — E872 Acidosis: Secondary | ICD-10-CM | POA: Diagnosis present

## 2015-07-08 DIAGNOSIS — L089 Local infection of the skin and subcutaneous tissue, unspecified: Secondary | ICD-10-CM

## 2015-07-08 DIAGNOSIS — G40909 Epilepsy, unspecified, not intractable, without status epilepticus: Secondary | ICD-10-CM | POA: Diagnosis present

## 2015-07-08 DIAGNOSIS — L89610 Pressure ulcer of right heel, unstageable: Secondary | ICD-10-CM | POA: Diagnosis present

## 2015-07-08 DIAGNOSIS — Z803 Family history of malignant neoplasm of breast: Secondary | ICD-10-CM | POA: Diagnosis not present

## 2015-07-08 DIAGNOSIS — M86659 Other chronic osteomyelitis, unspecified thigh: Secondary | ICD-10-CM | POA: Diagnosis not present

## 2015-07-08 DIAGNOSIS — L8990 Pressure ulcer of unspecified site, unspecified stage: Secondary | ICD-10-CM | POA: Diagnosis present

## 2015-07-08 DIAGNOSIS — Z933 Colostomy status: Secondary | ICD-10-CM | POA: Diagnosis not present

## 2015-07-08 DIAGNOSIS — Z833 Family history of diabetes mellitus: Secondary | ICD-10-CM | POA: Diagnosis not present

## 2015-07-08 LAB — CBC WITH DIFFERENTIAL/PLATELET
BASOS PCT: 0 %
Basophils Absolute: 0 10*3/uL (ref 0.0–0.1)
EOS ABS: 0 10*3/uL (ref 0.0–0.7)
Eosinophils Relative: 0 %
HEMATOCRIT: 31.6 % — AB (ref 39.0–52.0)
HEMOGLOBIN: 9.5 g/dL — AB (ref 13.0–17.0)
LYMPHS ABS: 0.9 10*3/uL (ref 0.7–4.0)
Lymphocytes Relative: 4 %
MCH: 24.8 pg — AB (ref 26.0–34.0)
MCHC: 30.1 g/dL (ref 30.0–36.0)
MCV: 82.5 fL (ref 78.0–100.0)
MONOS PCT: 8 %
Monocytes Absolute: 2 10*3/uL — ABNORMAL HIGH (ref 0.1–1.0)
NEUTROS ABS: 21 10*3/uL — AB (ref 1.7–7.7)
NEUTROS PCT: 88 %
Platelets: 779 10*3/uL — ABNORMAL HIGH (ref 150–400)
RBC: 3.83 MIL/uL — AB (ref 4.22–5.81)
RDW: 18.5 % — ABNORMAL HIGH (ref 11.5–15.5)
WBC: 23.9 10*3/uL — AB (ref 4.0–10.5)

## 2015-07-08 LAB — COMPREHENSIVE METABOLIC PANEL
ALT: 23 U/L (ref 17–63)
ANION GAP: 12 (ref 5–15)
AST: 22 U/L (ref 15–41)
Albumin: 2.8 g/dL — ABNORMAL LOW (ref 3.5–5.0)
Alkaline Phosphatase: 89 U/L (ref 38–126)
BUN: 41 mg/dL — ABNORMAL HIGH (ref 6–20)
CHLORIDE: 103 mmol/L (ref 101–111)
CO2: 21 mmol/L — AB (ref 22–32)
CREATININE: 1.23 mg/dL (ref 0.61–1.24)
Calcium: 8.9 mg/dL (ref 8.9–10.3)
Glucose, Bld: 145 mg/dL — ABNORMAL HIGH (ref 65–99)
Potassium: 5.1 mmol/L (ref 3.5–5.1)
SODIUM: 136 mmol/L (ref 135–145)
Total Bilirubin: 0.3 mg/dL (ref 0.3–1.2)
Total Protein: 7.7 g/dL (ref 6.5–8.1)

## 2015-07-08 LAB — URINALYSIS, ROUTINE W REFLEX MICROSCOPIC
Bilirubin Urine: NEGATIVE
Glucose, UA: NEGATIVE mg/dL
Ketones, ur: NEGATIVE mg/dL
NITRITE: NEGATIVE
PH: 7.5 (ref 5.0–8.0)
Protein, ur: 30 mg/dL — AB
SPECIFIC GRAVITY, URINE: 1.012 (ref 1.005–1.030)

## 2015-07-08 LAB — CBC
HCT: 28.8 % — ABNORMAL LOW (ref 39.0–52.0)
Hemoglobin: 8.8 g/dL — ABNORMAL LOW (ref 13.0–17.0)
MCH: 24.9 pg — AB (ref 26.0–34.0)
MCHC: 30.6 g/dL (ref 30.0–36.0)
MCV: 81.4 fL (ref 78.0–100.0)
PLATELETS: 715 10*3/uL — AB (ref 150–400)
RBC: 3.54 MIL/uL — ABNORMAL LOW (ref 4.22–5.81)
RDW: 18.5 % — AB (ref 11.5–15.5)
WBC: 25 10*3/uL — ABNORMAL HIGH (ref 4.0–10.5)

## 2015-07-08 LAB — BASIC METABOLIC PANEL
ANION GAP: 13 (ref 5–15)
BUN: 42 mg/dL — ABNORMAL HIGH (ref 6–20)
CALCIUM: 8.7 mg/dL — AB (ref 8.9–10.3)
CHLORIDE: 102 mmol/L (ref 101–111)
CO2: 20 mmol/L — ABNORMAL LOW (ref 22–32)
CREATININE: 1.38 mg/dL — AB (ref 0.61–1.24)
GFR calc non Af Amer: 59 mL/min — ABNORMAL LOW (ref >60)
Glucose, Bld: 127 mg/dL — ABNORMAL HIGH (ref 65–99)
Potassium: 4.7 mmol/L (ref 3.5–5.1)
SODIUM: 135 mmol/L (ref 135–145)

## 2015-07-08 LAB — GLUCOSE, CAPILLARY: GLUCOSE-CAPILLARY: 110 mg/dL — AB (ref 65–99)

## 2015-07-08 LAB — PROCALCITONIN: Procalcitonin: 1.68 ng/mL

## 2015-07-08 LAB — URINE MICROSCOPIC-ADD ON

## 2015-07-08 LAB — PROTIME-INR
INR: 1.37 (ref 0.00–1.49)
Prothrombin Time: 16.4 seconds — ABNORMAL HIGH (ref 11.6–15.2)

## 2015-07-08 LAB — LACTIC ACID, PLASMA
LACTIC ACID, VENOUS: 1 mmol/L (ref 0.5–2.0)
Lactic Acid, Venous: 1 mmol/L (ref 0.5–2.0)
Lactic Acid, Venous: 0.7 mmol/L (ref 0.5–2.0)
Lactic Acid, Venous: 1.3 mmol/L (ref 0.5–2.0)

## 2015-07-08 LAB — APTT: APTT: 32 s (ref 24–37)

## 2015-07-08 LAB — MRSA PCR SCREENING: MRSA BY PCR: NEGATIVE

## 2015-07-08 MED ORDER — SODIUM CHLORIDE 0.9 % IV SOLN
INTRAVENOUS | Status: DC
  Administered 2015-07-09 – 2015-07-10 (×2): via INTRAVENOUS

## 2015-07-08 MED ORDER — CETYLPYRIDINIUM CHLORIDE 0.05 % MT LIQD
7.0000 mL | Freq: Two times a day (BID) | OROMUCOSAL | Status: DC
  Administered 2015-07-09 – 2015-07-11 (×4): 7 mL via OROMUCOSAL

## 2015-07-08 MED ORDER — VITAMIN C 500 MG PO TABS
500.0000 mg | ORAL_TABLET | Freq: Every morning | ORAL | Status: DC
  Administered 2015-07-08 – 2015-07-11 (×4): 500 mg via ORAL
  Filled 2015-07-08 (×4): qty 1

## 2015-07-08 MED ORDER — SUCRALFATE 1 G PO TABS
1.0000 g | ORAL_TABLET | Freq: Four times a day (QID) | ORAL | Status: DC
  Administered 2015-07-08 – 2015-07-11 (×12): 1 g via ORAL
  Filled 2015-07-08 (×15): qty 1

## 2015-07-08 MED ORDER — JUVEN PO PACK
1.0000 | PACK | Freq: Two times a day (BID) | ORAL | Status: DC
Start: 2015-07-08 — End: 2015-07-11
  Filled 2015-07-08 (×8): qty 1

## 2015-07-08 MED ORDER — PIPERACILLIN-TAZOBACTAM 3.375 G IVPB
3.3750 g | Freq: Three times a day (TID) | INTRAVENOUS | Status: DC
  Administered 2015-07-08 – 2015-07-10 (×7): 3.375 g via INTRAVENOUS
  Filled 2015-07-08 (×7): qty 50

## 2015-07-08 MED ORDER — METOCLOPRAMIDE HCL 10 MG PO TABS
10.0000 mg | ORAL_TABLET | Freq: Three times a day (TID) | ORAL | Status: DC
  Administered 2015-07-08 – 2015-07-11 (×9): 10 mg via ORAL
  Filled 2015-07-08 (×12): qty 1

## 2015-07-08 MED ORDER — ENOXAPARIN SODIUM 40 MG/0.4ML ~~LOC~~ SOLN
40.0000 mg | SUBCUTANEOUS | Status: DC
  Administered 2015-07-08 – 2015-07-11 (×4): 40 mg via SUBCUTANEOUS
  Filled 2015-07-08 (×4): qty 0.4

## 2015-07-08 MED ORDER — ONDANSETRON HCL 4 MG/2ML IJ SOLN: 4.0000 mg | Freq: Three times a day (TID) | INTRAMUSCULAR | Status: DC | PRN

## 2015-07-08 MED ORDER — SODIUM CHLORIDE 0.9 % IV BOLUS (SEPSIS)
1500.0000 mL | Freq: Once | INTRAVENOUS | Status: AC
  Administered 2015-07-08: 1500 mL via INTRAVENOUS

## 2015-07-08 MED ORDER — PANTOPRAZOLE SODIUM 40 MG PO TBEC
40.0000 mg | DELAYED_RELEASE_TABLET | Freq: Two times a day (BID) | ORAL | Status: DC
  Administered 2015-07-08 – 2015-07-11 (×7): 40 mg via ORAL
  Filled 2015-07-08 (×9): qty 1

## 2015-07-08 MED ORDER — COLLAGENASE 250 UNIT/GM EX OINT
TOPICAL_OINTMENT | Freq: Every day | CUTANEOUS | Status: DC
  Filled 2015-07-08: qty 30

## 2015-07-08 MED ORDER — SODIUM CHLORIDE 0.9% FLUSH
3.0000 mL | Freq: Two times a day (BID) | INTRAVENOUS | Status: DC
  Administered 2015-07-08 – 2015-07-10 (×5): 3 mL via INTRAVENOUS

## 2015-07-08 MED ORDER — MIRABEGRON ER 25 MG PO TB24
25.0000 mg | ORAL_TABLET | Freq: Every day | ORAL | Status: DC
  Administered 2015-07-08 – 2015-07-11 (×4): 25 mg via ORAL
  Filled 2015-07-08 (×5): qty 1

## 2015-07-08 MED ORDER — DOCUSATE SODIUM 100 MG PO CAPS: 200.0000 mg | ORAL_CAPSULE | Freq: Two times a day (BID) | ORAL | Status: DC | PRN

## 2015-07-08 MED ORDER — ACETAMINOPHEN 325 MG PO TABS: 650.0000 mg | ORAL_TABLET | Freq: Four times a day (QID) | ORAL | Status: DC | PRN

## 2015-07-08 MED ORDER — COLLAGENASE 250 UNIT/GM EX OINT
TOPICAL_OINTMENT | Freq: Every day | CUTANEOUS | Status: DC
  Administered 2015-07-08 – 2015-07-10 (×3): via TOPICAL
  Filled 2015-07-08 (×2): qty 30

## 2015-07-08 MED ORDER — VANCOMYCIN HCL 10 G IV SOLR
1250.0000 mg | Freq: Two times a day (BID) | INTRAVENOUS | Status: DC
  Filled 2015-07-08: qty 1250

## 2015-07-08 MED ORDER — ZINC SULFATE 220 (50 ZN) MG PO CAPS
220.0000 mg | ORAL_CAPSULE | Freq: Two times a day (BID) | ORAL | Status: DC
Start: 2015-07-08 — End: 2015-07-11
  Administered 2015-07-08 – 2015-07-11 (×7): 220 mg via ORAL
  Filled 2015-07-08 (×10): qty 1

## 2015-07-08 MED ORDER — BOOST PO LIQD
237.0000 mL | Freq: Three times a day (TID) | ORAL | Status: DC
  Administered 2015-07-08 – 2015-07-11 (×8): 237 mL via ORAL
  Filled 2015-07-08 (×12): qty 237

## 2015-07-08 MED ORDER — BACLOFEN 20 MG PO TABS
20.0000 mg | ORAL_TABLET | Freq: Four times a day (QID) | ORAL | Status: DC
  Administered 2015-07-08 – 2015-07-11 (×12): 20 mg via ORAL
  Filled 2015-07-08: qty 2
  Filled 2015-07-08 (×4): qty 1
  Filled 2015-07-08: qty 2
  Filled 2015-07-08 (×9): qty 1
  Filled 2015-07-08: qty 2
  Filled 2015-07-08: qty 1
  Filled 2015-07-08 (×2): qty 2
  Filled 2015-07-08: qty 1
  Filled 2015-07-08 (×3): qty 2
  Filled 2015-07-08: qty 1

## 2015-07-08 MED ORDER — OXYCODONE HCL 5 MG PO TABS: 10.0000 mg | ORAL_TABLET | Freq: Three times a day (TID) | ORAL | Status: DC | PRN

## 2015-07-08 MED ORDER — ADULT MULTIVITAMIN W/MINERALS CH
1.0000 | ORAL_TABLET | Freq: Every morning | ORAL | Status: DC
  Administered 2015-07-08 – 2015-07-11 (×4): 1 via ORAL
  Filled 2015-07-08 (×4): qty 1

## 2015-07-08 MED ORDER — MAGNESIUM OXIDE 400 (241.3 MG) MG PO TABS
400.0000 mg | ORAL_TABLET | Freq: Every day | ORAL | Status: DC
  Administered 2015-07-08 – 2015-07-11 (×4): 400 mg via ORAL
  Filled 2015-07-08 (×4): qty 1

## 2015-07-08 MED ORDER — FERROUS SULFATE 325 (65 FE) MG PO TABS
325.0000 mg | ORAL_TABLET | Freq: Three times a day (TID) | ORAL | Status: DC
  Administered 2015-07-08 – 2015-07-11 (×9): 325 mg via ORAL
  Filled 2015-07-08 (×12): qty 1

## 2015-07-08 MED ORDER — DARIFENACIN HYDROBROMIDE ER 7.5 MG PO TB24: 7.5000 mg | ORAL_TABLET | Freq: Every day | ORAL | Status: DC

## 2015-07-08 MED ORDER — VANCOMYCIN HCL IN DEXTROSE 750-5 MG/150ML-% IV SOLN
750.0000 mg | Freq: Two times a day (BID) | INTRAVENOUS | Status: DC
  Administered 2015-07-08 – 2015-07-09 (×3): 750 mg via INTRAVENOUS
  Filled 2015-07-08 (×3): qty 150

## 2015-07-08 NOTE — Consult Note (Addendum)
WOC ostomy consult note Patient well known to our department for multiple past admissions.  Last seen by this write approximately 5 weeks ago and 1 month prior to that. Patient is followed by Dr. [name] (Plastics) as an outpatient. It is recommend that he continue to follow up with her for direction and POC in wound care following discharge. Stoma type/location: LLQ Colostomy Stomal assessment/size: 1 and 1/4 inches round, moist red Peristomal assessment: intact Treatment options for stomal/peristomal skin: Skin barrier ring Output soft brown stool Ostomy pouching: 2pc., 2 and 3/4 inch pouching system with skin barrier ring Education provided: None. Patient's caregiver manages ostomy and system with recently changed. Orders are provided for in-house Nursing staff. Patient is reminded to notify staff with pouching system is 1/3 to 1/2 full of flatus or stool. 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  It is challenging to articular the anatomical areas involved as patient has had numerous plastic surgery interventions that have significantly altered landmarks. The estimated total area is 30cm x 20cm with a deep tunneled area around the head of the right ischial tuberosity that measures at least 8cm deep and for which the measured depth is positional. There are both red and yellow tissues in these connecting wounds. Left heel:  Stage 3 pressure injury measuring 4cm x 2cm x 0.2cm with pink, moist wound bed Left lateral malleolus: Unstageable pressure injury measuring 3cm x 1cm with yellow slough obscuring wound bed Right medial heel:  Unstageable pressure injury measuring 1cm round with black eschar obscuring wound bed. Right lateral LE:  Unstageable pressure injury measuring 0.2cm round with yellow slough obscuring wound bed. Right lateral trunk:  Unstageable pressure injury:  7cm x 5cm with necrotic tissue obscuring  wound bed. Wound bed:See above Drainage (amount, consistency, odor) No exudate (scant serous) to LEs, moderate to large amounts of serosanguinous to light yellow exudate, particularly from the right ischial tuberosity.  Patient leaks urine from his suprapublic catheter and this mixes with wound exudate. Periwound:Intact with multiples areas of scarring and evidence of previous wound healing. Dressing procedure/placement/frequency:Patient is on a mattress replacement wihile in ICU and I will ensure via order that this therapy continues upon transfer to the med/surg floor. We will float the heels (patient declines pressure redistribution heel boots that he has been provided with on numerous occasions and does not have with him today). Patient uses collagen dressings at home; he is aware that we do not use that here in acute care.  He understands that we will use the collagenase ointment to the area of necrotic tissue in the sacral area/ischial tuberosity, left LE and the right lateral trunk.  NS dressings will be used on all other wounds and are to be changed twice daily.  WOC ostomy consult note Stoma type/location: LLQ colostomy Stomal assessment/size: viewed through pouching system, approximately 1 and 1/4 inches round, red, budded.  Diverting ostomy in place for several years. Peristomal assessment: Not seen today Treatment options for stomal/peristomal skin: skin barrier ring provided. Output: Brown stool Ostomy pouching: 2pc.ostomy pouching system, 2 and 3/4 inches.  Pouch # 649, skin barrier #2 with skin barrier ring [phone]) Education provided: None Enrolled patient in [place] program: No  WOC nursing team will not follow, but will remain available to this patient, the nursing and medical teams.  Please re-consult if needed. Thanks, Maudie Flakes, MSN, RN, [place], Arther Abbott  Pager# [phone]

## 2015-07-08 NOTE — Progress Notes (Signed)
[facility]  Patient Status: Active (receiving services up to time of hospitalization)  [facility] is providing the following services: RN and MSW.  If patient discharges after hours, please call [phone].   [name] [name] 07/08/2015, 2:42 PM

## 2015-07-08 NOTE — Care Management Note (Signed)
Case Management Note  Patient Details  Name: [name] [name] MRN: [id number] Date of Birth: [birth year]  Subjective/Objective:             sepsis       Action/Plan:Date: July 08, 2015 Chart reviewed for concurrent status and case management needs. Will continue to follow patient for changes and needs: Velva Harman, BSN, RN, [place]   [phone]   Expected Discharge Date:                  Expected Discharge Plan:  Home/Self Care  In-House Referral:  NA  Discharge planning Services  CM Consult  Post Acute Care Choice:    Choice offered to:     DME Arranged:    DME Agency:     HH Arranged:    [place] Agency:     Status of Service:  Completed, signed off  Medicare Important Message Given:    Date Medicare IM Given:    Medicare IM give by:    Date Additional Medicare IM Given:    Additional Medicare Important Message give by:     If discussed at [place] of Stay Meetings, dates discussed:    Additional Comments:  Leeroy Cha, RN 07/08/2015, 11:05 AM
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
## 2015-07-08 NOTE — Progress Notes (Signed)
Patients BP 84/57 @ 0823 patient asymptomatic. Dr. Wynelle Cleveland notified no new orders received will continue to monitor.

## 2015-07-08 NOTE — ED Notes (Signed)
Verified IJ is ready for use with Dr. Rolland Porter.

## 2015-07-08 NOTE — ED Notes (Signed)
Verified bed with Megan on 2W.  Patient can go to the floor at 0550.

## 2015-07-08 NOTE — Progress Notes (Signed)
Patient BP 75/45 at 1501. Patient asymptomatic.  Dr. Wynelle Cleveland notified new orders received. Manual BP 98/66 at 1558 results paged to MD. Will continue to monitor and notify MD as needed.

## 2015-07-08 NOTE — Progress Notes (Addendum)
MEDICATION RELATED CONSULT NOTE - INITIAL   Pharmacy Consult for renal adjustment Indication: AKI  Patient Measurements: Height: 5\' 11"  (180.3 cm) Weight: 197 lb 15.6 oz (89.8 kg) IBW/kg (Calculated) : 75.3 Adjusted Body Weight: 81 kg    Estimated Creatinine Clearance: 69.7 mL/min (by C-G formula based on Cr of 1.38).  Medications:  Scheduled:  . antiseptic oral rinse  7 mL Mouth Rinse BID  . baclofen  20 mg Oral QID  . collagenase   Topical Daily  . enoxaparin (LOVENOX) injection  40 mg Subcutaneous Q24H  . ferrous sulfate  325 mg Oral TID WC  . lactose free nutrition  237 mL Oral TID BM  . magnesium oxide  400 mg Oral Daily  . metoCLOPramide  10 mg Oral TID AC  . mirabegron ER  25 mg Oral Daily  . multivitamin with minerals  1 tablet Oral q morning - 10a  . nutrition supplement (JUVEN)  1 packet Oral BID BM  . pantoprazole  40 mg Oral BID AC  . piperacillin-tazobactam (ZOSYN)  IV  3.375 g Intravenous 3 times per day  . sodium chloride flush  3 mL Intravenous Q12H  . sucralfate  1 g Oral QID  . vancomycin  1,250 mg Intravenous Q12H  . vitamin C  500 mg Oral q morning - 10a  . zinc sulfate  220 mg Oral BID   Infusions:  . sodium chloride 75 mL/hr at 07/08/15 0700   PRN: acetaminophen, docusate sodium, ondansetron, oxyCODONE  Assessment: 50 yo quadriplegic male admitted for sepsis.  Baseline SCr ~0.5; now 1.38 and Pharmacy consulted to renally adjust medications.   Expect baseline SCr low d/t quadriplegia, thus current CrCl is overestimate.  Assuming SCr 0.8-1 more accurately reflects baseline SCr, an increase of 2.7x baseline would more accurately correspond to a SCr of 2.2 and CrCl ~ 40 ml/min  Goal of Therapy:  Medication dosing appropriate for true renal function  Plan:   No non-antibiotic medications require dose adjustment currently   No renal adjustment listed for baclofen, but may want to consider reducing dose with persistent hypotension, which can be  exacerbated by baclofen.  Would continue current doses of Lovenox, Reglan, and Myrbetric for now, will monitor daily and adjust as necessary  See abx note for vancomycin/Zosyn dosing  Reuel Boom, PharmD, BCPS Pager: [phone] 07/08/2015, 8:11 AM

## 2015-07-08 NOTE — Progress Notes (Signed)
Pharmacy Antibiotic Note  [name] [name] is a 50 y.o. male admitted on 07/07/2015 with sepsis.  Currently on Day 1 vancomycin dosing  Today, 07/08/2015: Temp: 100.8 on admission WBC: elevated Renal: new AKI; CrCl ~40 (see renal adjust note 2/22 for rationale)  Plan:  Reduce vancomycin to 750 mg IV q12 hr.  Patient had therapeutic VT last month on 1250 mg q12 hr at baseline renal function.  Also note Hx vanc accumulation in [place] as ordered Follow clinical course, renal function, culture results as available Follow for de-escalation of antibiotics and LOT   Height: 5\' 11"  (180.3 cm) Weight: 197 lb 15.6 oz (89.8 kg) IBW/kg (Calculated) : 75.3  Temp (24hrs), Avg:99.8 F (37.7 C), Min:98.7 F (37.1 C), Max:100.8 F (38.2 C)   Recent Labs Lab 07/01/15 1606 07/07/15 2345 07/08/15 0436  WBC 20.8* 23.9* 25.0*  CREATININE 0.57* 1.23 1.38*  LATICACIDVEN  --  1.0 1.0    Estimated Creatinine Clearance: 69.7 mL/min (by C-G formula based on Cr of 1.38).    Allergies  Allergen Reactions  . Ditropan [Oxybutynin] Other (See Comments)    hallucinations   Antimicrobials this admission: Vancomycin 2/21 >> Zosyn 2/21 >>  Dose adjustments this admission: decrease vancomycin to 750 mg q12 for new AKI.   Microbiology results: 2/22 BCx: IP 2/22 UCx: IP  2/22 MRSA PCR: neg Note extensive micro Hx including VRE, acinetobacter, PsA, MRSA, proteus, providencia, citrobacter, klebsiella, enterobacter (no ESBL noted)  Thank you for allowing pharmacy to be a part of this patient's care.  Reuel Boom, PharmD, BCPS Pager: [phone] 07/08/2015, 8:27 AM

## 2015-07-08 NOTE — Progress Notes (Signed)
PT Cancellation Note  Patient Details Name: BERKLEE ZACHAR MRN: [id number] DOB: 08/06/1965   Cancelled Treatment:    Reason Eval/Treat Not Completed: PT screened, no needs identified, will sign off. Pt is total care at baseline and has caregivers at home. Will sign off. Thanks.    Weston Anna, MPT Pager: [phone]
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
## 2015-07-08 NOTE — Progress Notes (Signed)
[facility]  50 year old male with quadriplegia, suprapubic catheter, colostomy, severe decubitus ulcers, OSA on C Pap, seizure disorder and morbid obesity. The patient presented for nausea vomiting and fever. He was recently treated for probable tendency of bacteremia, infected sacral decubitus/sacral osteomyelitis and a UTI with 3 week course of Zosyn. He was also placed on doxycycline for suppressive therapy. In the ER he was noted to have WBC count that was elevated at 23.9 and a temperature of 100.8 and was admitted to the hospital for sepsis.  Subjective: No complaints of cough sore throat and runny nose or earache. Nausea and vomiting have resolved. He has not noted any increase output of stool in his colostomy. He states his urine was initially dark yesterday but he did not notice any pus. No other complaints.  Principal Problem:   Sepsis- fever, hypotension, leukocytosis - normal lactic acid -As mentioned above, recently treated for [facility] here bacteremia, sacral osteomyelitis and possible UTI with Zosyn and also placed on suppressive therapy with doxycycline (recommended by infectious disease) -Now with low-grade fever and leukocytosis once again -Receiving Zosyn and vancomycin -I have asked ID for assistance with management of this complicated case-follow culture results -Unfortunately, a palliative care consult was recommended by ID on his last admission as it was felt that he is at high risk for further wound infections  Active Problems:  AKI- mild acidosis - possibly due to sepsis- follow - on IVF and having a good amount of urine output  Extensive sacral decubitus ulcer, Left mid back ulcer and right foot ulcer - Wound care eval requested - his sacral wound is extensive, extending from lower lumbar area to scrotum- he has had an eval with plastic surgery and unfortunately is not a candidate for further debridement  Quadriplegia with suprapubic catheter and  colostomy -Continue baclofen   OSA on CPAP --Continue Pap at that time  Severe malnutrition -Continue boost and Juven   Reactive thrombocytosis   Debbe Odea, MD Pager: [email or web address]

## 2015-07-08 NOTE — Progress Notes (Signed)
Pharmacy Antibiotic Note  [name] [name] is a 50 y.o. male admitted on 07/07/2015 with sepsis.  Pharmacy has been consulted for Vancomycin dosing.  Plan: Vancomycin 1250mg  IV every q12 hours.  Goal trough 15-20 mcg/mL.  Height: 6' (182.9 cm) Weight: 200 lb 9.9 oz (91 kg) IBW/kg (Calculated) : 77.6  Temp (24hrs), Avg:99.8 F (37.7 C), Min:98.7 F (37.1 C), Max:100.8 F (38.2 C)   Recent Labs Lab 07/01/15 1606 07/07/15 2345 07/08/15 0436  WBC 20.8* 23.9* 25.0*  CREATININE 0.57* 1.23 1.38*  LATICACIDVEN  --  1.0 1.0    Estimated Creatinine Clearance: 71.9 mL/min (by C-G formula based on Cr of 1.38).    Allergies  Allergen Reactions  . Ditropan [Oxybutynin] Other (See Comments)    hallucinations    Antimicrobials this admission: Vancomycin 2/22 >>  Zosyn 2/22 >>   Thank you for allowing pharmacy to be a part of this patient's care.  Nani Skillern Crowford 07/08/2015 6:23 AM

## 2015-07-08 NOTE — Progress Notes (Signed)
Spoke with patient about nocturnal CPAP. He does wish to wear it during this hospital stay, but does not wish to start at this time, while being held in the emergency department. Also, procedure being performed for line placement. He is aware that RT will deliver the equipment to his room, upon his receiving an inpatient bed. RT will continue to follow.

## 2015-07-09 DIAGNOSIS — M86659 Other chronic osteomyelitis, unspecified thigh: Secondary | ICD-10-CM

## 2015-07-09 DIAGNOSIS — L8990 Pressure ulcer of unspecified site, unspecified stage: Secondary | ICD-10-CM

## 2015-07-09 LAB — GLUCOSE, CAPILLARY
GLUCOSE-CAPILLARY: 106 mg/dL — AB (ref 65–99)
Glucose-Capillary: 85 mg/dL (ref 65–99)

## 2015-07-09 LAB — URINE CULTURE

## 2015-07-09 LAB — CBC
HEMATOCRIT: 25.7 % — AB (ref 39.0–52.0)
HEMOGLOBIN: 7.8 g/dL — AB (ref 13.0–17.0)
MCH: 25.2 pg — ABNORMAL LOW (ref 26.0–34.0)
MCHC: 30.4 g/dL (ref 30.0–36.0)
MCV: 83.2 fL (ref 78.0–100.0)
PLATELETS: 554 10*3/uL — AB (ref 150–400)
RBC: 3.09 MIL/uL — AB (ref 4.22–5.81)
RDW: 18.4 % — ABNORMAL HIGH (ref 11.5–15.5)
WBC: 11 10*3/uL — AB (ref 4.0–10.5)

## 2015-07-09 LAB — BASIC METABOLIC PANEL
Anion gap: 7 (ref 5–15)
BUN: 23 mg/dL — AB (ref 6–20)
CHLORIDE: 111 mmol/L (ref 101–111)
CO2: 21 mmol/L — AB (ref 22–32)
CREATININE: 0.56 mg/dL — AB (ref 0.61–1.24)
Calcium: 8.4 mg/dL — ABNORMAL LOW (ref 8.9–10.3)
GFR calc non Af Amer: >60 mL/min (ref >60)
GLUCOSE: 156 mg/dL — AB (ref 65–99)
Potassium: 3.4 mmol/L — ABNORMAL LOW (ref 3.5–5.1)
Sodium: 139 mmol/L (ref 135–145)

## 2015-07-09 MED ORDER — VANCOMYCIN HCL 10 G IV SOLR
1250.0000 mg | Freq: Two times a day (BID) | INTRAVENOUS | Status: DC
  Administered 2015-07-09 – 2015-07-10 (×2): 1250 mg via INTRAVENOUS
  Filled 2015-07-09 (×3): qty 1250

## 2015-07-09 MED ORDER — SODIUM CHLORIDE 0.9 % IV BOLUS (SEPSIS)
500.0000 mL | Freq: Once | INTRAVENOUS | Status: AC
  Administered 2015-07-09: 500 mL via INTRAVENOUS

## 2015-07-09 MED ORDER — POTASSIUM CHLORIDE CRYS ER 20 MEQ PO TBCR
40.0000 meq | EXTENDED_RELEASE_TABLET | Freq: Once | ORAL | Status: AC
  Administered 2015-07-09: 40 meq via ORAL
  Filled 2015-07-09: qty 2

## 2015-07-09 NOTE — Progress Notes (Signed)
Pharmacy Antibiotic Note  [name] [name] is a 50 y.o. male admitted on 07/07/2015 with sepsis.  Currently on Day 2 vancomycin and Zosyn for sepsis d/t UTI vs wound infection  Today, 07/09/2015: Temp: 100.8 on admission, afebrile since WBC: elevated; increasing Renal: new AKI - resolved; CrCl > 90; UOP decent Continues to be hypotensive   Plan:  Resume vancomycin 1250 mg IV q12 hr with improved CrCl.  Will still need to watch for accumulation  Continue Zosyn as ordered Follow clinical course, renal function, culture results as available Follow for de-escalation of antibiotics and LOT   Height: 5\' 11"  (180.3 cm) Weight: 197 lb 15.6 oz (89.8 kg) IBW/kg (Calculated) : 75.3  Temp (24hrs), Avg:98.4 F (36.9 C), Min:97.8 F (36.6 C), Max:98.8 F (37.1 C)   Recent Labs Lab 07/07/15 2345 07/08/15 0436 07/08/15 0719 07/08/15 1550 07/09/15 0727  WBC 23.9* 25.0*  --   --   --   CREATININE 1.23 1.38*  --   --  0.56*  LATICACIDVEN 1.0 1.0 0.7 1.3  --     Estimated Creatinine Clearance: 120.3 mL/min (by C-G formula based on Cr of 0.56).    Allergies  Allergen Reactions  . Ditropan [Oxybutynin] Other (See Comments)    hallucinations   Antimicrobials this admission: Vancomycin 2/21 >> Zosyn 2/21 >> On suppressive doxy PTA  Dose adjustments this admission: 2/22 decrease vancomycin to 750 mg q12 for new AKI.  Patient had therapeutic VT last month on 1250 q12 at baseline renal function.  Also note Hx vanc accumulation in Care everywhere 2/23: readjust vanc for resolved AKI  Microbiology results: 2/22 BCx: IP 2/21 UCx: IP (UA +) 2/22 MRSA PCR: neg Note extensive micro Hx including VRE, acinetobacter, PsA, MRSA, proteus, providencia, citrobacter, klebsiella, enterobacter (no ESBL noted)  Thank you for allowing pharmacy to be a part of this patient's care.  Reuel Boom, PharmD, BCPS Pager: [phone] 07/09/2015, 11:38 AM

## 2015-07-09 NOTE — Progress Notes (Signed)
OT Cancellation Note  Patient Details Name: [name] [name] MRN: [id number] DOB: 07/25/1965   Cancelled Treatment:    Reason Eval/Treat Not Completed: OT screened, no needs identified, will sign off -- Pt is total care at baseline and has caregivers at home. Will sign off. Thanks.   Travian Kerner A 07/09/2015, 7:46 AM

## 2015-07-09 NOTE — Progress Notes (Signed)
Patient ID: [name] [name], male   DOB: May 09, 1966, 50 y.o.   MRN: [id number]         [facility] for Infectious Disease    Date of Admission:  07/07/2015   Total days of antibiotics 2         His blood pressure remains low but he is now afebrile. Admission blood cultures remain negative and his urine culture is contaminated with multiple species. I recommend continuing vancomycin and piperacillin tazobactam pending final cultures. RepeatPalliative Care consult is pending.         Michel Bickers, MD [facility] [place] [facility] [phone] pager   [phone] cell 05/19/2015, 1:32 PM

## 2015-07-09 NOTE — Progress Notes (Signed)
Notified [name] [name] of pt having a BP of 86/50, P-100, pt asymptomatic. Orders received. Pt has no complaints at this time.

## 2015-07-09 NOTE — Progress Notes (Signed)
RT placed patient on CPAP. Home setting is 19 cmH2O. RT placed sterile water in water chamber for humidification. Patient is tolerating well. RT will continue to monitor.

## 2015-07-09 NOTE — Progress Notes (Signed)
Initial Nutrition Assessment  DOCUMENTATION CODES:   Not applicable  INTERVENTION:  Ensure Enlive po BID, each supplement provides 350 kcal and 20 grams of protein Juven 1 Packet BID, each supplement provides 14g protein and 80 calories.   NUTRITION DIAGNOSIS:   Increased nutrient needs related to chronic illness, wound healing as evidenced by estimated needs.  GOAL:   Patient will meet greater than or equal to 90% of their needs  MONITOR:   PO intake, Supplement acceptance, I & O's, Labs, Skin  REASON FOR ASSESSMENT:   Low [place]    ASSESSMENT:   [name] [name] is a 50 y.o. male with PMH of quadriplegia with suprapubic catheter, s/p of ostomy, sepsis, decubitus ulcers,gastric ulcer, obesity, OSA on CPAP, seizure, who presents with nausea, vomiting, fever, and multiple wounds  Spoke with pt at bedside. [name] [name] is well known to RD, to [facility]. He endorses continuing on a high protein diet at home. Good appetite, consumed 100% of meals so far. His weight is still fluctuating between 197-205. No chewing or swallowing problems. On reglan to help resolve nausea, but doesn't seem to affect his eating at this time. Per ID, pt is a recurrent case of septic wounds. Palliative consult is in for goals of care  Nutrition-Focused physical exam completed. Findings are no fat depletion, no muscle depletion, and no edema.   Follow for goals of care.  Diet Order:  Diet regular Room service appropriate?: Yes; Fluid consistency:: Thin  Skin:   6 pressure ulcers stg II-III, from foot to buttocks.  Last BM:  2/22  Height:   Ht Readings from Last 1 Encounters:  07/08/15 5\' 11"  (1.803 m)    Weight:   Wt Readings from Last 1 Encounters:  07/08/15 197 lb 15.6 oz (89.8 kg)    Ideal Body Weight:  78.18 kg  BMI:  Body mass index is 27.62 kg/(m^2).  Estimated Nutritional Needs:   Kcal:  2000-2400 calories  Protein:  115-130 grams  Fluid:  >/= 1.8L  EDUCATION NEEDS:    No education needs identified at this time  Satira Anis. Yuette Putnam, MS, RD LDN After Hours/Weekend Pager [phone]

## 2015-07-09 NOTE — Progress Notes (Signed)
[facility] Progress Note   [name] [name]  [id number]  DOB: 1965/07/19  DOA: 07/07/2015 PCP: [name], MD  Brief narrative: 50 year old male with quadriplegia, suprapubic catheter, colostomy, severe decubitus ulcers, OSA on C Pap, seizure disorder and morbid obesity. The patient presented for nausea vomiting and fever. He was recently treated for probable tendency of bacteremia, infected sacral decubitus/sacral osteomyelitis and a UTI with 3 week course of Zosyn. He was also placed on doxycycline for suppressive therapy. In the ER he was noted to have WBC count that was elevated at 23.9 and a temperature of 100.8 and was admitted to the hospital for sepsis.   Subjective: No complaints of cough, dyspnea, pain, nausea or vomiting.  Assessment/Plan: Sepsis- fever, hypotension, leukocytosis - normal lactic acid -As mentioned above, recently treated for [facility] here bacteremia, sacral osteomyelitis and possible UTI with Zosyn and also placed on suppressive therapy with doxycycline (recommended by infectious disease) -Now with low-grade fever and leukocytosis once again -Receiving Zosyn and vancomycin -I have asked ID for assistance with management of this complicated case-follow culture results -A palliative care consult was recommended by ID on his last admission as it was felt that he is at high risk for further wound infections- I have requested this consult  Active Problems:  AKI- mild acidosis - possibly prerenal and due to sepsis-improving with on IVF - having a good amount of urine output-will cut back on IV fluids  Hypotension -His blood pressures ranging from [id number] to 0000000 systolic however, lactic acid is normal and therefore will hold off on treating this aggressively with pressors  Hypokalemia -Replaced today  Extensive sacral decubitus ulcer, Left mid back ulcer and right foot ulcer - Wound care eval requested - his sacral wound is extensive, extending  from lower lumbar area to scrotum- he has had an eval with plastic surgery and unfortunately is not a candidate for further debridement  Paraplegia with suprapubic catheter and colostomy -Continue baclofen  OSA on CPAP --Continue CPap at that time  Severe malnutrition -Continue boost and Juven  Reactive thrombocytosis    Antibiotics: Anti-infectives    Start     Dose/Rate Route Frequency Ordered Stop   07/09/15 2200  vancomycin (VANCOCIN) 1,250 mg in sodium chloride 0.9 % 250 mL IVPB     1,250 mg 166.7 mL/hr over 90 Minutes Intravenous Every 12 hours 07/09/15 1142     07/08/15 1200  vancomycin (VANCOCIN) 1,250 mg in sodium chloride 0.9 % 250 mL IVPB  Status:  Discontinued     1,250 mg 166.7 mL/hr over 90 Minutes Intravenous Every 12 hours 07/08/15 0622 07/08/15 0838   07/08/15 1200  vancomycin (VANCOCIN) IVPB 750 mg/150 ml premix  Status:  Discontinued     750 mg 150 mL/hr over 60 Minutes Intravenous Every 12 hours 07/08/15 0838 07/09/15 1142   07/08/15 0600  piperacillin-tazobactam (ZOSYN) IVPB 3.375 g     3.375 g 12.5 mL/hr over 240 Minutes Intravenous 3 times per day 07/08/15 0415     07/07/15 2330  vancomycin (VANCOCIN) IVPB 1000 mg/200 mL premix     1,000 mg 200 mL/hr over 60 Minutes Intravenous  Once 07/07/15 2327 07/08/15 0541   07/07/15 2315  piperacillin-tazobactam (ZOSYN) IVPB 3.375 g     3.375 g 12.5 mL/hr over 240 Minutes Intravenous  Once 07/07/15 2312 07/08/15 0410     Code Status:     Code Status Orders        Start     Ordered  07/08/15 0417  Full code   Continuous     07/08/15 0417    Code Status History    Date Active Date Inactive Code Status Order ID Comments User Context   05/29/2015  4:10 PM 06/13/2015 12:35 AM Full Code [id number]  Lily Kocher, MD ED   05/13/2015 12:49 AM 05/19/2015 10:02 PM Full Code [id number]  Reubin Milan, MD Inpatient   04/27/2015  8:19 PM 05/05/2015 10:33 PM Full Code [id number]  Etta Quill, DO ED   03/16/2015   1:56 PM 03/20/2015  9:53 PM Full Code [id number]  Willia Craze, NP Inpatient   12/08/2014  2:19 AM 12/15/2014  5:43 PM Full Code [id number]  Toy Baker, MD ED   10/08/2014  3:46 PM 10/10/2014  8:16 PM Full Code [id number]  Florencia Reasons, MD Inpatient   09/02/2014 11:49 PM 09/08/2014  8:56 PM Full Code [id number]  Theressa Millard, MD Inpatient   08/28/2014  4:42 PM 08/29/2014 12:23 PM Full Code [id number]  Merton, DO Inpatient   07/16/2014  5:37 PM 07/19/2014  5:21 PM Full Code [id number]  Samuella Cota, MD Inpatient   04/17/2014  9:00 PM 04/22/2014 11:48 PM Full Code [id number]  Carylon Perches, MD Inpatient   04/17/2014  4:43 PM 04/17/2014  9:00 PM Full Code [id number]  Debbe Odea, MD ED   01/03/2014  4:17 PM 01/06/2014  5:52 PM Full Code [id number]  Azzie Roup, MD Inpatient   12/22/2013  2:39 AM 01/03/2014  4:17 PM Full Code [id number]  Theressa Millard, MD Inpatient   09/23/2013  9:10 PM 09/28/2013  9:24 PM Full Code [id number]  Shanda Howells, MD ED   09/23/2013  4:28 PM 09/23/2013  9:10 PM Full Code [id number]  Azzie Roup, MD ED   06/28/2013  3:12 PM 07/01/2013 10:16 PM Full Code [id number]  Azzie Roup, MD Inpatient   06/21/2013  9:58 PM 06/28/2013  3:12 PM Full Code [id number]  Etta Quill, DO ED   02/21/2013  6:53 AM 03/02/2013  8:02 PM Full Code [id number]  Phillips Grout, MD Inpatient   01/25/2013  1:36 AM 02/01/2013  6:50 PM Full Code [id number]  Phillips Grout, MD Inpatient   11/07/2012  5:56 PM 11/20/2012  5:09 PM Full Code [id number]  Elmarie Shiley, MD Inpatient   09/01/2012  9:21 PM 09/07/2012  5:28 PM Full Code [id number]  Orvan Falconer, MD Inpatient   07/10/2012  9:33 PM 07/13/2012  8:28 PM Full Code [id number]  Velvet Bathe, MD Inpatient   05/04/2012  9:55 AM 05/22/2012 10:15 PM Full Code [id number]  Gurney Maxin, RN ED   04/22/2012  2:29 PM 04/26/2012  9:44 PM Full Code [id number]  [name], RN Inpatient   01/17/2012  6:20 PM 01/24/2012  8:41 PM Full Code [id number]  Stacie Glaze, RN ED    07/19/2011  1:35 AM 07/23/2011  8:00 PM Full Code [id number]  Marisa Cyphers, RN ED    Advance Directive Documentation        Most Recent Value   Type of Advance Directive  Healthcare Power of Attorney, Living will   Pre-existing out of facility DNR order (yellow form or pink MOST form)     "MOST" Form in Place?       Family Communication: Spoke with his caretaker [name] today Disposition Plan: Continue to follow in step down unit DVT prophylaxis: Lovenox Consultants: Infectious disease Procedures:     Objective: Filed  Weights   07/08/15 0400 07/08/15 0635  Weight: 91 kg (200 lb 9.9 oz) 89.8 kg (197 lb 15.6 oz)    Intake/Output Summary (Last 24 hours) at 07/09/15 1312 Last data filed at 07/09/15 0900  Gross per 24 hour  Intake   2470 ml  Output   1075 ml  Net   1395 ml     Vitals Filed Vitals:   07/09/15 0800 07/09/15 0900 07/09/15 1100 07/09/15 1200  BP:  90/47 100/59 113/64  Pulse:  98 99 96  Temp: 97.8 F (36.6 C)     TempSrc: Oral     Resp:  14 14 16   Height:      Weight:      SpO2:  99% 98% 98%    Exam:  General:  Pt is alert, not in acute distress  HEENT: No icterus, No thrush, oral mucosa moist  Cardiovascular: regular rate and rhythm, S1/S2 No murmur  Respiratory: clear to auscultation bilaterally   Abdomen: Soft, +Bowel sounds, non tender, non distended, no guarding-colostomy with dark brown stool in bag, urostomy with yellow pale urine in bag  MSK: No cyanosis or clubbing- no pedal edema- lower extremities are atrophied, bilateral hands are contracted  Skin: Multiple decubitus ulcers present on lumbosacral area (stage IV-30 cm x 20 cm), right lateral posterior trunk, right lateral lower extremity, right medial heel, left lateral malleolus and left heel which are either stage 3-4 or necrotic and unstageable   Data Reviewed: Basic Metabolic Panel:  Recent Labs Lab 07/07/15 2345 07/08/15 0436 07/09/15 0727  NA 136 135 139  K 5.1 4.7 3.4*  CL  103 102 111  CO2 21* 20* 21*  GLUCOSE 145* 127* 156*  BUN 41* 42* 23*  CREATININE 1.23 1.38* 0.56*  CALCIUM 8.9 8.7* 8.4*   Liver Function Tests:  Recent Labs Lab 07/07/15 2345  AST 22  ALT 23  ALKPHOS 89  BILITOT 0.3  PROT 7.7  ALBUMIN 2.8*   No results for input(s): LIPASE, AMYLASE in the last 168 hours. No results for input(s): AMMONIA in the last 168 hours. CBC:  Recent Labs Lab 07/07/15 2345 07/08/15 0436 07/09/15 1220  WBC 23.9* 25.0* 11.0*  NEUTROABS 21.0*  --   --   HGB 9.5* 8.8* 7.8*  HCT 31.6* 28.8* 25.7*  MCV 82.5 81.4 83.2  PLT 779* 715* 554*   Cardiac Enzymes: No results for input(s): CKTOTAL, CKMB, CKMBINDEX, TROPONINI in the last 168 hours. BNP (last 3 results) No results for input(s): BNP in the last 8760 hours.  ProBNP (last 3 results) No results for input(s): PROBNP in the last 8760 hours.  CBG:  Recent Labs Lab 07/08/15 0744 07/09/15 0803  GLUCAP 110* 85    Recent Results (from the past 240 hour(s))  Urine culture     Status: None   Collection Time: 07/07/15 11:46 PM  Result Value Ref Range Status   Specimen Description URINE, CLEAN CATCH  Final   Special Requests NONE  Final   Culture   Final    MULTIPLE SPECIES PRESENT, SUGGEST RECOLLECTION Performed at [facility]    Report Status 07/09/2015 FINAL  Final  MRSA PCR Screening     Status: None   Collection Time: 07/08/15  6:28 AM  Result Value Ref Range Status   MRSA by PCR NEGATIVE NEGATIVE Final    Comment:        The GeneXpert MRSA Assay (FDA approved for NASAL specimens only), is one component of a comprehensive  MRSA colonization surveillance program. It is not intended to diagnose MRSA infection nor to guide or monitor treatment for MRSA infections.      Studies: Dg Chest Port 1 View  07/08/2015  CLINICAL DATA:  Central line repositioning.  Initial encounter. EXAM: PORTABLE CHEST 1 VIEW COMPARISON:  Chest radiograph performed earlier today at 3:23 a.m.  FINDINGS: The patient's right IJ line is noted ending about the mid to distal SVC. Mildly increased interstitial markings may reflect mild interstitial edema. Vascular congestion is noted. The right costophrenic angle is incompletely imaged on this study. A small right pleural effusion is again noted. No pneumothorax is seen. The cardiomediastinal silhouette is mildly enlarged. No acute osseous abnormalities are seen. Postoperative change is seen overlying the cervical spine. IMPRESSION: 1. Right IJ line noted ending about the mid to distal SVC. 2. Mildly increased interstitial markings may reflect mild interstitial edema. Vascular congestion and mild cardiomegaly noted. Small right pleural effusion again seen. Electronically Signed   By: Garald Balding M.D.   On: 07/08/2015 04:22   Dg Chest Port 1 View  07/08/2015  CLINICAL DATA:  Central line placement.  Initial encounter. EXAM: PORTABLE CHEST 1 VIEW COMPARISON:  Chest radiograph performed 07/07/2015 FINDINGS: The patient's right IJ line is noted ending about the expected location of the tricuspid valve. This should be retracted approximately 12 cm. A small right pleural effusion is noted. Mild vascular congestion is seen, with mildly increased interstitial markings, raising question for minimal interstitial edema. No pneumothorax is seen. The cardiomediastinal silhouette is borderline enlarged. No acute osseous abnormalities are identified. Postoperative change is noted along the lower cervical spine. IMPRESSION: 1. Right IJ line noted ending about the expected location of the tricuspid valve. This should be retracted approximately 12 cm to the cavoatrial junction. 2. Small right pleural effusion. Mild vascular congestion and borderline cardiomegaly. Mildly increased interstitial markings raise question for minimal interstitial edema. These results were called by telephone at the time of interpretation on 07/08/2015 at 3:55 am to Dr. Quintella Reichert, who  verbally acknowledged these results. Electronically Signed   By: Garald Balding M.D.   On: 07/08/2015 03:55   Dg Chest Port 1 View  07/07/2015  CLINICAL DATA:  Nausea and vomiting starting today. EXAM: PORTABLE CHEST 1 VIEW COMPARISON:  05/29/2015 FINDINGS: 2314 hours. Semi upright rotated portable study. No evidence for airspace pulmonary edema or substantial pleural effusion. No focal airspace consolidation. The cardio pericardial silhouette is enlarged. The previously described destructive lesion involving the posterior right fifth rib is superimposed upon the inferior right scapula on today's study. Telemetry leads overlie the chest. IMPRESSION: Cardiomegaly with chronic interstitial changes. No overt pulmonary edema or focal lung consolidation. Destructive lesion involving the right fifth rib is superimposed on the inferior scapula today. Electronically Signed   By: Misty [name] M.D.   On: 07/07/2015 23:29    Scheduled Meds:  Scheduled Meds: . antiseptic oral rinse  7 mL Mouth Rinse BID  . baclofen  20 mg Oral QID  . collagenase   Topical Daily  . enoxaparin (LOVENOX) injection  40 mg Subcutaneous Q24H  . ferrous sulfate  325 mg Oral TID WC  . lactose free nutrition  237 mL Oral TID BM  . magnesium oxide  400 mg Oral Daily  . metoCLOPramide  10 mg Oral TID AC  . mirabegron ER  25 mg Oral Daily  . multivitamin with minerals  1 tablet Oral q morning - 10a  . nutrition supplement (JUVEN)  1 packet Oral BID BM  . pantoprazole  40 mg Oral BID AC  . piperacillin-tazobactam (ZOSYN)  IV  3.375 g Intravenous 3 times per day  . sodium chloride flush  3 mL Intravenous Q12H  . sucralfate  1 g Oral QID  . vancomycin  1,250 mg Intravenous Q12H  . vitamin C  500 mg Oral q morning - 10a  . zinc sulfate  220 mg Oral BID   Continuous Infusions: . sodium chloride 100 mL/hr at 07/09/15 0058    Time spent on care of this patient: 62 min   Paden, MD 07/09/2015, 1:12 PM  LOS: 1 day    [facility] Office  [phone] Pager - Text Page per [email or web address] If 7PM-7AM, please contact night-coverage [email or web address]

## 2015-07-10 ENCOUNTER — Encounter: Payer: Medicare Other | Admitting: Physical Medicine & Rehabilitation

## 2015-07-10 LAB — GLUCOSE, CAPILLARY: Glucose-Capillary: 94 mg/dL (ref 65–99)

## 2015-07-10 MED ORDER — METRONIDAZOLE 500 MG PO TABS
500.0000 mg | ORAL_TABLET | Freq: Three times a day (TID) | ORAL | Status: DC
  Administered 2015-07-10 – 2015-07-11 (×2): 500 mg via ORAL
  Filled 2015-07-10 (×5): qty 1

## 2015-07-10 MED ORDER — CEFIXIME 400 MG PO CAPS
400.0000 mg | ORAL_CAPSULE | Freq: Every day | ORAL | Status: DC
  Administered 2015-07-10 – 2015-07-11 (×2): 400 mg via ORAL
  Filled 2015-07-10 (×2): qty 1
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
## 2015-07-10 NOTE — Progress Notes (Addendum)
[facility] Progress Note   [name] [name]  [id number]  DOB: 13-Mar-1966  DOA: 07/07/2015 PCP: [name], MD  Brief narrative: 50 year old male with quadriplegia, suprapubic catheter, colostomy, severe decubitus ulcers, OSA on C Pap, seizure disorder and morbid obesity. The patient presented for nausea vomiting and fever. He was recently treated for probable tendency of bacteremia, infected sacral decubitus/sacral osteomyelitis and a UTI with 3 week course of Zosyn. He was also placed on doxycycline for suppressive therapy. In the ER he was noted to have WBC count that was elevated at 23.9 and a temperature of 100.8 and was admitted to the hospital for sepsis.   Subjective: No complaints of cough, dyspnea, pain, nausea or vomiting.  Assessment/Plan: Sepsis- fever, hypotension, leukocytosis - normal lactic acid -As mentioned above, recently treated for [facility] here bacteremia, sacral osteomyelitis and possible UTI with Zosyn and also placed on suppressive therapy with doxycycline (recommended by infectious disease) -Now with low-grade fever and leukocytosis once again -Receiving Zosyn and vancomycin -I have asked ID for assistance with management of this complicated case- cultures negative -A palliative care consult was recommended by ID on his last admission as it was felt that he is at high risk for further wound infections- I have requested this consult  Active Problems:  AKI- mild acidosis - possibly prerenal and due to sepsis-improving with on IVF - continues to have a good amount of urine output- eating and drinking well- will stop IVF today  Hypotension -His blood pressures were ranging from [id number] to 0000000 systolic however, lactic acid is normal and therefore held off on treating this aggressively with pressors - SBP improved to low 90-100 range- will transfer out of SDU  Hypokalemia -Replaced  Extensive sacral decubitus ulcer, Left mid back ulcer and right  foot ulcer - Wound care eval requested - his sacral wound is extensive, extending from lower lumbar area to scrotum- he has had an eval with plastic surgery and unfortunately is not a candidate for further debridement  Paraplegia with suprapubic catheter and colostomy -Continue baclofen  AOCD and Iron deficiency - cont Iron replacement  OSA on CPAP --Continue CPap at that time  Severe malnutrition -Continue boost and Juven  Reactive thrombocytosis    Antibiotics: Anti-infectives    Start     Dose/Rate Route Frequency Ordered Stop   07/09/15 2200  vancomycin (VANCOCIN) 1,250 mg in sodium chloride 0.9 % 250 mL IVPB     1,250 mg 166.7 mL/hr over 90 Minutes Intravenous Every 12 hours 07/09/15 1142     07/08/15 1200  vancomycin (VANCOCIN) 1,250 mg in sodium chloride 0.9 % 250 mL IVPB  Status:  Discontinued     1,250 mg 166.7 mL/hr over 90 Minutes Intravenous Every 12 hours 07/08/15 0622 07/08/15 0838   07/08/15 1200  vancomycin (VANCOCIN) IVPB 750 mg/150 ml premix  Status:  Discontinued     750 mg 150 mL/hr over 60 Minutes Intravenous Every 12 hours 07/08/15 0838 07/09/15 1142   07/08/15 0600  piperacillin-tazobactam (ZOSYN) IVPB 3.375 g     3.375 g 12.5 mL/hr over 240 Minutes Intravenous 3 times per day 07/08/15 0415     07/07/15 2330  vancomycin (VANCOCIN) IVPB 1000 mg/200 mL premix     1,000 mg 200 mL/hr over 60 Minutes Intravenous  Once 07/07/15 2327 07/08/15 0541   07/07/15 2315  piperacillin-tazobactam (ZOSYN) IVPB 3.375 g     3.375 g 12.5 mL/hr over 240 Minutes Intravenous  Once 07/07/15 2312 07/08/15 0410  Code Status:     Code Status Orders        Start     Ordered   07/08/15 0417  Full code   Continuous     07/08/15 0417    Code Status History    Family Communication: Spoke with his caretaker [name]   Disposition Plan: Continue to follow in step down unit DVT prophylaxis: Lovenox Consultants: Infectious disease Procedures:     Objective: Filed  Weights   07/08/15 0400 07/08/15 0635  Weight: 91 kg (200 lb 9.9 oz) 89.8 kg (197 lb 15.6 oz)    Intake/Output Summary (Last 24 hours) at 07/10/15 1350 Last data filed at 07/10/15 1011  Gross per 24 hour  Intake   1320 ml  Output   3475 ml  Net  -2155 ml     Vitals Filed Vitals:   07/10/15 1000 07/10/15 1100 07/10/15 1200 07/10/15 1300  BP: 86/45 139/74 76/42 110/53  Pulse: 86 86 88 85  Temp:   97.6 F (36.4 C)   TempSrc:   Oral   Resp: 12 18 16 13   Height:      Weight:      SpO2: 99% 100% 100% 100%    Exam:  General:  Pt is alert, not in acute distress  HEENT: No icterus, No thrush, oral mucosa moist  Cardiovascular: regular rate and rhythm, S1/S2 No murmur  Respiratory: clear to auscultation bilaterally   Abdomen: Soft, +Bowel sounds, non tender, non distended, no guarding-colostomy with dark brown stool in bag, urostomy with yellow pale urine in bag  MSK: No cyanosis or clubbing- no pedal edema- lower extremities are atrophied, bilateral hands are contracted  Skin: Multiple decubitus ulcers present on lumbosacral area (stage IV-30 cm x 20 cm), right lateral posterior trunk, right lateral lower extremity, right medial heel, left lateral malleolus and left heel which are either stage 3-4 or necrotic and unstageable   Data Reviewed: Basic Metabolic Panel:  Recent Labs Lab 07/07/15 2345 07/08/15 0436 07/09/15 0727  NA 136 135 139  K 5.1 4.7 3.4*  CL 103 102 111  CO2 21* 20* 21*  GLUCOSE 145* 127* 156*  BUN 41* 42* 23*  CREATININE 1.23 1.38* 0.56*  CALCIUM 8.9 8.7* 8.4*   Liver Function Tests:  Recent Labs Lab 07/07/15 2345  AST 22  ALT 23  ALKPHOS 89  BILITOT 0.3  PROT 7.7  ALBUMIN 2.8*   No results for input(s): LIPASE, AMYLASE in the last 168 hours. No results for input(s): AMMONIA in the last 168 hours. CBC:  Recent Labs Lab 07/07/15 2345 07/08/15 0436 07/09/15 1220  WBC 23.9* 25.0* 11.0*  NEUTROABS 21.0*  --   --   HGB 9.5* 8.8*  7.8*  HCT 31.6* 28.8* 25.7*  MCV 82.5 81.4 83.2  PLT 779* 715* 554*   Cardiac Enzymes: No results for input(s): CKTOTAL, CKMB, CKMBINDEX, TROPONINI in the last 168 hours. BNP (last 3 results) No results for input(s): BNP in the last 8760 hours.  ProBNP (last 3 results) No results for input(s): PROBNP in the last 8760 hours.  CBG:  Recent Labs Lab 07/08/15 0744 07/09/15 0803 07/09/15 1211 07/10/15 0743  GLUCAP 110* 85 106* 94    Recent Results (from the past 240 hour(s))  Urine culture     Status: None   Collection Time: 07/07/15 11:46 PM  Result Value Ref Range Status   Specimen Description URINE, CLEAN CATCH  Final   Special Requests NONE  Final   Culture  Final    MULTIPLE SPECIES PRESENT, SUGGEST RECOLLECTION Performed at [facility]    Report Status 07/09/2015 FINAL  Final  MRSA PCR Screening     Status: None   Collection Time: 07/08/15  6:28 AM  Result Value Ref Range Status   MRSA by PCR NEGATIVE NEGATIVE Final    Comment:        The GeneXpert MRSA Assay (FDA approved for NASAL specimens only), is one component of a comprehensive MRSA colonization surveillance program. It is not intended to diagnose MRSA infection nor to guide or monitor treatment for MRSA infections.   Blood Culture (routine x 2)     Status: None (Preliminary result)   Collection Time: 07/08/15  7:40 AM  Result Value Ref Range Status   Specimen Description BLOOD RIGHT HAND  Final   Special Requests IN PEDIATRIC BOTTLE 2CC  Final   Culture   Final    NO GROWTH 1 DAY Performed at [facility]    Report Status PENDING  Incomplete  Blood Culture (routine x 2)     Status: None (Preliminary result)   Collection Time: 07/08/15  8:04 AM  Result Value Ref Range Status   Specimen Description BLOOD LEFT HAND  Final   Special Requests IN PEDIATRIC BOTTLE 5CC  Final   Culture   Final    NO GROWTH 1 DAY Performed at [facility]    Report Status PENDING  Incomplete      Studies: No results found.  Scheduled Meds:  Scheduled Meds: . antiseptic oral rinse  7 mL Mouth Rinse BID  . baclofen  20 mg Oral QID  . collagenase   Topical Daily  . enoxaparin (LOVENOX) injection  40 mg Subcutaneous Q24H  . ferrous sulfate  325 mg Oral TID WC  . lactose free nutrition  237 mL Oral TID BM  . magnesium oxide  400 mg Oral Daily  . metoCLOPramide  10 mg Oral TID AC  . mirabegron ER  25 mg Oral Daily  . multivitamin with minerals  1 tablet Oral q morning - 10a  . nutrition supplement (JUVEN)  1 packet Oral BID BM  . pantoprazole  40 mg Oral BID AC  . piperacillin-tazobactam (ZOSYN)  IV  3.375 g Intravenous 3 times per day  . sodium chloride flush  3 mL Intravenous Q12H  . sucralfate  1 g Oral QID  . vancomycin  1,250 mg Intravenous Q12H  . vitamin C  500 mg Oral q morning - 10a  . zinc sulfate  220 mg Oral BID   Continuous Infusions:    Time spent on care of this patient: 35 min   [place], MD 07/10/2015, 1:50 PM  LOS: 2 days   [facility] Office  [phone] Pager - Text Page per [email or web address] If 7PM-7AM, please contact night-coverage [email or web address]

## 2015-07-10 NOTE — Progress Notes (Signed)
   07/10/15 1100  Clinical Encounter Type  Visited With Patient  Visit Type Initial;Spiritual support;Social support;Psychological support  Referral From [facility] visited with pt; pt pleasant disposition and warm smile; very much a person of faith and fighter; discouraged upon hearing that at some point antibiotics will not be effective; anticipating conversation with palliative care on [place]; [place] will follow. Gwynn Burly 11:29 AM

## 2015-07-10 NOTE — Progress Notes (Signed)
Spoke with pt regarding cpap.  Pt stated he was not ready for bed yet and asked that I return around midnight.  This RT will come back at that time per pt request.

## 2015-07-11 DIAGNOSIS — L89154 Pressure ulcer of sacral region, stage 4: Secondary | ICD-10-CM

## 2015-07-11 LAB — BASIC METABOLIC PANEL
Anion gap: 6 (ref 5–15)
BUN: 12 mg/dL (ref 6–20)
CALCIUM: 8.6 mg/dL — AB (ref 8.9–10.3)
CO2: 23 mmol/L (ref 22–32)
CREATININE: 0.48 mg/dL — AB (ref 0.61–1.24)
Chloride: 112 mmol/L — ABNORMAL HIGH (ref 101–111)
Glucose, Bld: 95 mg/dL (ref 65–99)
Potassium: 3.5 mmol/L (ref 3.5–5.1)
SODIUM: 141 mmol/L (ref 135–145)

## 2015-07-11 LAB — CBC
HCT: 23.7 % — ABNORMAL LOW (ref 39.0–52.0)
Hemoglobin: 7.2 g/dL — ABNORMAL LOW (ref 13.0–17.0)
MCH: 25.3 pg — ABNORMAL LOW (ref 26.0–34.0)
MCHC: 30.4 g/dL (ref 30.0–36.0)
MCV: 83.2 fL (ref 78.0–100.0)
PLATELETS: 494 10*3/uL — AB (ref 150–400)
RBC: 2.85 MIL/uL — AB (ref 4.22–5.81)
RDW: 18.3 % — AB (ref 11.5–15.5)
WBC: 10.6 10*3/uL — AB (ref 4.0–10.5)

## 2015-07-11 MED ORDER — SODIUM CHLORIDE 0.9% FLUSH: 10.0000 mL | Freq: Two times a day (BID) | INTRAVENOUS | Status: DC

## 2015-07-11 MED ORDER — CEFIXIME 400 MG PO CAPS: 400.0000 mg | ORAL_CAPSULE | Freq: Every day | ORAL | Status: DC

## 2015-07-11 MED ORDER — METRONIDAZOLE 500 MG PO TABS: 500.0000 mg | ORAL_TABLET | Freq: Three times a day (TID) | ORAL | Status: DC

## 2015-07-11 MED ORDER — FUROSEMIDE 20 MG PO TABS: 20.0000 mg | ORAL_TABLET | Freq: Two times a day (BID) | ORAL | Status: DC | PRN

## 2015-07-11 MED ORDER — SODIUM CHLORIDE 0.9% FLUSH: 10.0000 mL | INTRAVENOUS | Status: DC | PRN

## 2015-07-11 MED ORDER — POTASSIUM CHLORIDE CRYS ER 20 MEQ PO TBCR: 40.0000 meq | EXTENDED_RELEASE_TABLET | Freq: Two times a day (BID) | ORAL | Status: DC | PRN

## 2015-07-11 NOTE — Discharge Summary (Addendum)
Physician Discharge Summary  [name] [name] [id number] DOB: 10-May-1966 DOA: 07/07/2015  PCP: [name], MD  Admit date: 07/07/2015 Discharge date: 07/11/2015  Time spent: 50 minutes  Recommendations for Outpatient Follow-up:  1. Close ID follow-up of wounds  Discharge Condition: Stable    Discharge Diagnoses:  Principal Problem:   Sepsis ([place]) Active Problems:   PVD   Quadriplegia ([place])   Sacral decubitus ulcer, stage IV ([name]   Suprapubic catheter ([name]   OSA on CPAP   Chronic osteomyelitis, pelvic region and thigh ([name]   Reactive thrombocytosis   Multiple wounds   Pressure ulcer   History of present illness:  50 year old male with quadriplegia, suprapubic catheter, colostomy, severe decubitus ulcers, OSA on C Pap, seizure disorder and morbid obesity. The patient presented for nausea vomiting and fever. He was recently treated for [facility] bacteremia, infected sacral decubitus/sacral osteomyelitis and a UTI with 3 week course of Zosyn. He was also placed on doxycycline for suppressive therapy. In the ER he was noted to have WBC count that was elevated at 23.9 and a temperature of 100.8 and was admitted to the hospital for sepsis.   Hospital Course:  Sepsis- fever, hypotension, leukocytosis- due to infected wounds/decubitus ulcers - normal lactic acid -As mentioned above, recently treated for [facility] here bacteremia, sacral osteomyelitis and possible UTI with Zosyn and also placed on suppressive therapy with doxycycline (recommended by infectious disease) -Now returns with low-grade fever and leukocytosis once again suspected to be secondary to infected decubitus ulcers - has been receiving Zosyn and vancomycin in the hospital -I have asked ID for assistance with management of this complicated case- cultures negative- has been transitioned to Cefixime and Flagyl by ID and recommended to stop after 10 days -Leukocytosis has improved from 25,000-10,000 -A  palliative care consult was recommended by ID on his last admission as it was felt that he is at high risk for further wound infections and also high risk for C. difficile colitis from extensive use of antibiotics -this was discussed with the patient and his caretaker in detail who understand this issue -Dr. Megan Salon has arranged for him to follow-up with [name] in the ID clinic   Active Problems:  Extensive sacral decubitus ulcer, Left mid back ulcer and right foot ulcer - Wound care eval requested - his sacral wound is extensive, extending from lower lumbar area, across his buttocks to scrotum-he has underwent plastic surgery procedures for in the past - On his last eval with plastic surgery, it was determined that he is not a candidate for further debridement or plastic surgery  - he should continue to f/u with outpatient wound care center- have recommended that his wounds be cleaned and dressings be changed 2 x day as there is a significant amount of serous discharge from wounds  AKI- mild acidosis - possibly prerenal and due to sepsis-improving with on IVF - continues to have a good amount of urine output- eating and drinking well for IV fluids were discontinued -Have changed furosemide to used only PRN (he uses it for ankle edema) to prevent further renal failure and dehydration  Hypotension -His blood pressures were ranging from [id number] to 0000000 systolic however, lactic acid is normal and therefore I held off on treating this aggressively with pressors - SBP improved to low 90-100 range which appears to be his baseline  Hypokalemia -Replaced  Paraplegia with suprapubic catheter and colostomy -Continue baclofen  AOCD and Iron deficiency - cont Iron replacement  OSA on CPAP --  Continue CPap at that time  Severe malnutrition -Continue boost and Juven  Reactive thrombocytosis   Consultations:  Infectious disease  Discharge Exam: Filed Weights   07/08/15 0400 07/08/15  [id number]  Weight: 91 kg (200 lb 9.9 oz) 89.8 kg (197 lb 15.6 oz)   Filed Vitals:   07/11/15 0022 07/11/15 0530  BP:  92/53  Pulse: 106 96  Temp:  98.7 F (37.1 C)  Resp: 16 16     General: Pt is alert, not in acute distress  HEENT: No icterus, No thrush, oral mucosa moist  Cardiovascular: regular rate and rhythm, S1/S2 No murmur  Respiratory: clear to auscultation bilaterally   Abdomen: Soft, +Bowel sounds, non tender, non distended, no guarding-colostomy with dark brown stool in bag, urostomy with yellow pale urine in bag  MSK: No cyanosis or clubbing- no pedal edema- lower extremities are atrophied, bilateral hands are contracted  Skin: Multiple decubitus ulcers present on lumbosacral area (stage IV-30 cm x 20 cm), right lateral posterior trunk, right lateral lower extremity, right medial heel, left lateral malleolus and left heel which are either stage 3-4 or necrotic and unstageable   Discharge Instructions You were cared for by a hospitalist during your hospital stay. If you have any questions about your discharge medications or the care you received while you were in the hospital after you are discharged, you can call the unit and asked to speak with the hospitalist on call if the hospitalist that took care of you is not available. Once you are discharged, your primary care physician will handle any further medical issues. Please note that NO REFILLS for any discharge medications will be authorized once you are discharged, as it is imperative that you return to your primary care physician (or establish a relationship with a primary care physician if you do not have one) for your aftercare needs so that they can reassess your need for medications and monitor your lab values.      Discharge Instructions    Discharge instructions    Complete by:  As directed   Heart healthy diet Clean the  wounds and change the dressing at least twice a day     Increase activity slowly     Complete by:  As directed             Medication List    STOP taking these medications        doxycycline 100 MG tablet  Commonly known as:  VIBRA-TABS     fosfomycin 3 g Pack  Commonly known as:  MONUROL      TAKE these medications        acetaminophen 325 MG tablet  Commonly known as:  TYLENOL  Take 2 tablets (650 mg total) by mouth every 6 (six) hours as needed for mild pain (or Fever >/= 101).     baclofen 20 MG tablet  Commonly known as:  LIORESAL  Take 20 mg by mouth 4 (four) times daily.     Cefixime 400 MG Caps capsule  Commonly known as:  SUPRAX  Take 1 capsule (400 mg total) by mouth daily.     collagenase ointment  Commonly known as:  SANTYL  Apply topically daily. Apply to right flank wound, sacral/ischial wounds, right medial heel, left medial ankle wounds daily.     docusate sodium 100 MG capsule  Commonly known as:  COLACE  Take 2 capsules (200 mg total) by mouth 2 (two) times daily.     ferrous  sulfate 325 (65 FE) MG EC tablet  Take 1 tablet (325 mg total) by mouth 3 (three) times daily with meals.     furosemide 20 MG tablet  Commonly known as:  LASIX  Take 1 tablet (20 mg total) by mouth 2 (two) times daily as needed for fluid or edema. Take with Klor-Con     magnesium oxide 400 (241.3 Mg) MG tablet  Commonly known as:  MAG-OX  Take 1 tablet (400 mg total) by mouth daily.     metoCLOPramide 10 MG tablet  Commonly known as:  REGLAN  Take 1 tablet (10 mg total) by mouth 3 (three) times daily before meals.     metroNIDAZOLE 500 MG tablet  Commonly known as:  FLAGYL  Take 1 tablet (500 mg total) by mouth every 8 (eight) hours.     multivitamin with minerals Tabs tablet  Take 1 tablet by mouth every morning.     MYRBETRIQ 25 MG Tb24 tablet  Generic drug:  mirabegron ER  Take 25 mg by mouth daily.     lactose free nutrition Liqd  Take 237 mLs by mouth 3 (three) times daily between meals.     nutrition supplement (JUVEN) Pack  Take 1  packet by mouth 2 (two) times daily between meals.     ondansetron 4 MG disintegrating tablet  Commonly known as:  ZOFRAN ODT  Take 1 tablet (4 mg total) by mouth every 8 (eight) hours as needed for nausea.     Oxycodone HCl 10 MG Tabs  Take 10 mg by mouth every 8 (eight) hours as needed (pain). Reported on 07/01/2015     pantoprazole 40 MG tablet  Commonly known as:  PROTONIX  Take 1 tablet (40 mg total) by mouth 2 (two) times daily before a meal.     potassium chloride SA 20 MEQ tablet  Commonly known as:  K-DUR,KLOR-CON  Take 2 tablets (40 mEq total) by mouth 2 (two) times daily as needed (take with lasix for fluid).     sucralfate 1 g tablet  Commonly known as:  CARAFATE  Take 1 tablet (1 g total) by mouth 4 (four) times daily.     VESICARE 10 MG tablet  Generic drug:  solifenacin  Take 10 mg by mouth daily.     vitamin C 500 MG tablet  Commonly known as:  ASCORBIC ACID  Take 500 mg by mouth every morning.     Zinc 50 MG Tabs  Take 50 mg by mouth 2 (two) times daily.       Allergies  Allergen Reactions  . Ditropan [Oxybutynin] Other (See Comments)    hallucinations      The results of significant diagnostics from this hospitalization (including imaging, microbiology, ancillary and laboratory) are listed below for reference.    Significant Diagnostic Studies: Dg Chest Port 1 View  07/08/2015  CLINICAL DATA:  Central line repositioning.  Initial encounter. EXAM: PORTABLE CHEST 1 VIEW COMPARISON:  Chest radiograph performed earlier today at 3:23 a.m. FINDINGS: The patient's right IJ line is noted ending about the mid to distal SVC. Mildly increased interstitial markings may reflect mild interstitial edema. Vascular congestion is noted. The right costophrenic angle is incompletely imaged on this study. A small right pleural effusion is again noted. No pneumothorax is seen. The cardiomediastinal silhouette is mildly enlarged. No acute osseous abnormalities are seen.  Postoperative change is seen overlying the cervical spine. IMPRESSION: 1. Right IJ line noted ending about the mid to distal  SVC. 2. Mildly increased interstitial markings may reflect mild interstitial edema. Vascular congestion and mild cardiomegaly noted. Small right pleural effusion again seen. Electronically Signed   By: Garald Balding M.D.   On: 07/08/2015 04:22   Dg Chest Port 1 View  07/08/2015  CLINICAL DATA:  Central line placement.  Initial encounter. EXAM: PORTABLE CHEST 1 VIEW COMPARISON:  Chest radiograph performed 07/07/2015 FINDINGS: The patient's right IJ line is noted ending about the expected location of the tricuspid valve. This should be retracted approximately 12 cm. A small right pleural effusion is noted. Mild vascular congestion is seen, with mildly increased interstitial markings, raising question for minimal interstitial edema. No pneumothorax is seen. The cardiomediastinal silhouette is borderline enlarged. No acute osseous abnormalities are identified. Postoperative change is noted along the lower cervical spine. IMPRESSION: 1. Right IJ line noted ending about the expected location of the tricuspid valve. This should be retracted approximately 12 cm to the cavoatrial junction. 2. Small right pleural effusion. Mild vascular congestion and borderline cardiomegaly. Mildly increased interstitial markings raise question for minimal interstitial edema. These results were called by telephone at the time of interpretation on 07/08/2015 at 3:55 am to Dr. Quintella Reichert, who verbally acknowledged these results. Electronically Signed   By: Garald Balding M.D.   On: 07/08/2015 03:55   Dg Chest Port 1 View  07/07/2015  CLINICAL DATA:  Nausea and vomiting starting today. EXAM: PORTABLE CHEST 1 VIEW COMPARISON:  05/29/2015 FINDINGS: 2314 hours. Semi upright rotated portable study. No evidence for airspace pulmonary edema or substantial pleural effusion. No focal airspace consolidation. The cardio  pericardial silhouette is enlarged. The previously described destructive lesion involving the posterior right fifth rib is superimposed upon the inferior right scapula on today's study. Telemetry leads overlie the chest. IMPRESSION: Cardiomegaly with chronic interstitial changes. No overt pulmonary edema or focal lung consolidation. Destructive lesion involving the right fifth rib is superimposed on the inferior scapula today. Electronically Signed   By: Misty [name] M.D.   On: 07/07/2015 23:29    Microbiology: Recent Results (from the past 240 hour(s))  Urine culture     Status: None   Collection Time: 07/07/15 11:46 PM  Result Value Ref Range Status   Specimen Description URINE, CLEAN CATCH  Final   Special Requests NONE  Final   Culture   Final    MULTIPLE SPECIES PRESENT, SUGGEST RECOLLECTION Performed at [facility]    Report Status 07/09/2015 FINAL  Final  MRSA PCR Screening     Status: None   Collection Time: 07/08/15  6:28 AM  Result Value Ref Range Status   MRSA by PCR NEGATIVE NEGATIVE Final    Comment:        The GeneXpert MRSA Assay (FDA approved for NASAL specimens only), is one component of a comprehensive MRSA colonization surveillance program. It is not intended to diagnose MRSA infection nor to guide or monitor treatment for MRSA infections.   Blood Culture (routine x 2)     Status: None (Preliminary result)   Collection Time: 07/08/15  7:40 AM  Result Value Ref Range Status   Specimen Description BLOOD RIGHT HAND  Final   Special Requests IN PEDIATRIC BOTTLE 2CC  Final   Culture   Final    NO GROWTH 3 DAYS Performed at [facility]    Report Status PENDING  Incomplete  Blood Culture (routine x 2)     Status: None (Preliminary result)   Collection Time: 07/08/15  8:04  AM  Result Value Ref Range Status   Specimen Description BLOOD LEFT HAND  Final   Special Requests IN PEDIATRIC BOTTLE 5CC  Final   Culture   Final    NO GROWTH 3  DAYS Performed at [facility]    Report Status PENDING  Incomplete     Labs: Basic Metabolic Panel:  Recent Labs Lab 07/07/15 2345 07/08/15 0436 07/09/15 0727 07/11/15 0630  NA 136 135 139 141  K 5.1 4.7 3.4* 3.5  CL 103 102 111 112*  CO2 21* 20* 21* 23  GLUCOSE 145* 127* 156* 95  BUN 41* 42* 23* 12  CREATININE 1.23 1.38* 0.56* 0.48*  CALCIUM 8.9 8.7* 8.4* 8.6*   Liver Function Tests:  Recent Labs Lab 07/07/15 2345  AST 22  ALT 23  ALKPHOS 89  BILITOT 0.3  PROT 7.7  ALBUMIN 2.8*   No results for input(s): LIPASE, AMYLASE in the last 168 hours. No results for input(s): AMMONIA in the last 168 hours. CBC:  Recent Labs Lab 07/07/15 2345 07/08/15 0436 07/09/15 1220 07/11/15 0630  WBC 23.9* 25.0* 11.0* 10.6*  NEUTROABS 21.0*  --   --   --   HGB 9.5* 8.8* 7.8* 7.2*  HCT 31.6* 28.8* 25.7* 23.7*  MCV 82.5 81.4 83.2 83.2  PLT 779* 715* 554* 494*   Cardiac Enzymes: No results for input(s): CKTOTAL, CKMB, CKMBINDEX, TROPONINI in the last 168 hours. BNP: BNP (last 3 results) No results for input(s): BNP in the last 8760 hours.  ProBNP (last 3 results) No results for input(s): PROBNP in the last 8760 hours.  CBG:  Recent Labs Lab 07/08/15 0744 07/09/15 0803 07/09/15 1211 07/10/15 0743  GLUCAP 110* 85 106* 94       Signed:  Debbe Odea, MD [facility] 07/11/2015, 1:05 PM

## 2015-07-11 NOTE — Care Management Note (Addendum)
Case Management Note  Patient Details  Name: [name] [name] MRN: [id number] Date of Birth: 05-07-1966  Subjective/Objective:    Sepsis                Action/Plan: NCM spoke to pt at bedside. States he lives in home with his sister, [name] # [phone]. Pt states he has hospital bed and wheelchair at home. [facility] [address] [facility] [place]. Contacted [facility] on [address] they have medication in stock. [facility] [address] Pt provided Rx to take with him to the pharmacy. Pt states he [facility] medications [facility] that [facility]. Pt active with [facility] RN and SW. Resumption of care orders entered and [facility] aware of scheduled dc home today. CSW contacted for transport.  Expected Discharge Date:  07/11/2015              Expected Discharge Plan:  [place]  In-House Referral: Clinical Social Worker  Discharge planning Services  CM Consult  Post Acute Care Choice:  Home Health, Resumption of Svcs/PTA Provider Choice offered to:  Patient  DME Arranged:  N/A DME Agency:  NA  HH Arranged:  RN, Social Work [employer] Agency:  [place]  Status of Service:  Completed, signed off  Medicare Important Message Given:  Yes Date Medicare IM Given:    Medicare IM give by:    Date Additional Medicare IM Given:    Additional Medicare Important Message give by:     If discussed at [place] of Stay Meetings, dates discussed:    Additional Comments:  Erenest Rasher, RN 07/11/2015, 2:01 PM

## 2015-07-11 NOTE — Care Management Important Message (Signed)
Important Message  Patient Details  Name: [name] [name] MRN: [id number] Date of Birth: 04/11/1966   Medicare Important Message Given:  Yes    Erenest Rasher, RN 07/11/2015, 12:05 PM

## 2015-07-11 NOTE — Progress Notes (Signed)
Pt discharged to home. Left unit on stretcher pushed by ambulance personal.  Dressing change done prior to discharge. AVS and prescriptions given to pt. Pt unable to sign AVS since arms are contracted and pt is quadraplegic. Left in stable condition. No concerns voiced. VWilliams,rn.

## 2015-07-11 NOTE — Progress Notes (Signed)
Utilization review completed.  

## 2015-07-13 LAB — CULTURE, BLOOD (ROUTINE X 2)
CULTURE: NO GROWTH
Culture: NO GROWTH

## 2015-07-17 ENCOUNTER — Encounter: Payer: Medicare Other | Attending: Physical Medicine & Rehabilitation | Admitting: Physical Medicine & Rehabilitation

## 2015-07-17 ENCOUNTER — Encounter: Payer: Self-pay | Admitting: Physical Medicine & Rehabilitation

## 2015-07-17 VITALS — BP 96/63 | HR 100 | Resp 14

## 2015-07-17 DIAGNOSIS — I10 Essential (primary) hypertension: Secondary | ICD-10-CM | POA: Diagnosis not present

## 2015-07-17 DIAGNOSIS — M25512 Pain in left shoulder: Secondary | ICD-10-CM | POA: Diagnosis not present

## 2015-07-17 DIAGNOSIS — M25511 Pain in right shoulder: Secondary | ICD-10-CM | POA: Diagnosis not present

## 2015-07-17 DIAGNOSIS — G4733 Obstructive sleep apnea (adult) (pediatric): Secondary | ICD-10-CM | POA: Insufficient documentation

## 2015-07-17 DIAGNOSIS — M6250 Muscle wasting and atrophy, not elsewhere classified, unspecified site: Secondary | ICD-10-CM | POA: Diagnosis not present

## 2015-07-17 DIAGNOSIS — S12400A Unspecified displaced fracture of fifth cervical vertebra, initial encounter for closed fracture: Secondary | ICD-10-CM | POA: Insufficient documentation

## 2015-07-17 DIAGNOSIS — G894 Chronic pain syndrome: Secondary | ICD-10-CM | POA: Diagnosis not present

## 2015-07-17 DIAGNOSIS — G8250 Quadriplegia, unspecified: Secondary | ICD-10-CM | POA: Diagnosis not present

## 2015-07-17 DIAGNOSIS — Z5181 Encounter for therapeutic drug level monitoring: Secondary | ICD-10-CM | POA: Diagnosis not present

## 2015-07-17 DIAGNOSIS — L89154 Pressure ulcer of sacral region, stage 4: Secondary | ICD-10-CM | POA: Insufficient documentation

## 2015-07-17 DIAGNOSIS — M7522 Bicipital tendinitis, left shoulder: Secondary | ICD-10-CM

## 2015-07-17 DIAGNOSIS — F329 Major depressive disorder, single episode, unspecified: Secondary | ICD-10-CM | POA: Insufficient documentation

## 2015-07-17 DIAGNOSIS — J9600 Acute respiratory failure, unspecified whether with hypoxia or hypercapnia: Secondary | ICD-10-CM | POA: Diagnosis not present

## 2015-07-17 DIAGNOSIS — Z79899 Other long term (current) drug therapy: Secondary | ICD-10-CM

## 2015-07-17 DIAGNOSIS — D649 Anemia, unspecified: Secondary | ICD-10-CM | POA: Insufficient documentation

## 2015-07-17 DIAGNOSIS — R569 Unspecified convulsions: Secondary | ICD-10-CM | POA: Insufficient documentation

## 2015-07-17 DIAGNOSIS — L89159 Pressure ulcer of sacral region, unspecified stage: Secondary | ICD-10-CM | POA: Diagnosis present

## 2015-07-17 DIAGNOSIS — K219 Gastro-esophageal reflux disease without esophagitis: Secondary | ICD-10-CM | POA: Insufficient documentation

## 2015-07-17 DIAGNOSIS — K5660 Unspecified intestinal obstruction: Secondary | ICD-10-CM | POA: Insufficient documentation

## 2015-07-17 DIAGNOSIS — Z8719 Personal history of other diseases of the digestive system: Secondary | ICD-10-CM | POA: Diagnosis not present
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
## 2015-07-17 NOTE — Addendum Note (Signed)
Addended by: Clydene Laming, Thadeus Gandolfi L on: 07/17/2015 03:30 PM   Modules accepted: Orders

## 2015-07-20 ENCOUNTER — Encounter (HOSPITAL_BASED_OUTPATIENT_CLINIC_OR_DEPARTMENT_OTHER): Payer: Medicare Other | Attending: Internal Medicine

## 2015-07-20 DIAGNOSIS — L89894 Pressure ulcer of other site, stage 4: Secondary | ICD-10-CM | POA: Insufficient documentation

## 2015-07-20 DIAGNOSIS — D649 Anemia, unspecified: Secondary | ICD-10-CM | POA: Insufficient documentation

## 2015-07-20 DIAGNOSIS — M8668 Other chronic osteomyelitis, other site: Secondary | ICD-10-CM | POA: Insufficient documentation

## 2015-07-20 DIAGNOSIS — L89620 Pressure ulcer of left heel, unstageable: Secondary | ICD-10-CM | POA: Insufficient documentation

## 2015-07-20 DIAGNOSIS — L89324 Pressure ulcer of left buttock, stage 4: Secondary | ICD-10-CM | POA: Insufficient documentation

## 2015-07-20 DIAGNOSIS — G40909 Epilepsy, unspecified, not intractable, without status epilepticus: Secondary | ICD-10-CM | POA: Diagnosis not present

## 2015-07-20 DIAGNOSIS — L89154 Pressure ulcer of sacral region, stage 4: Secondary | ICD-10-CM | POA: Diagnosis not present

## 2015-07-20 DIAGNOSIS — I10 Essential (primary) hypertension: Secondary | ICD-10-CM | POA: Insufficient documentation

## 2015-07-20 DIAGNOSIS — G4730 Sleep apnea, unspecified: Secondary | ICD-10-CM | POA: Insufficient documentation

## 2015-07-20 DIAGNOSIS — G8250 Quadriplegia, unspecified: Secondary | ICD-10-CM | POA: Insufficient documentation

## 2015-07-20 DIAGNOSIS — X35XXXA Volcanic eruption, initial encounter: Secondary | ICD-10-CM | POA: Diagnosis not present

## 2015-07-21 ENCOUNTER — Telehealth: Payer: Self-pay

## 2015-07-21 DIAGNOSIS — M25512 Pain in left shoulder: Principal | ICD-10-CM

## 2015-07-21 DIAGNOSIS — M25511 Pain in right shoulder: Secondary | ICD-10-CM

## 2015-07-21 NOTE — Telephone Encounter (Signed)
Pt called stating he saw  AP on 07/17/15. He was told that he would have a medication sent over to his pharmacy but he still has not heard anything about it. I looked in the note, it may be Voltaren Gel? Please advise.

## 2015-07-22 MED ORDER — LIDOCAINE 5 % EX PTCH: 1.0000 | MEDICATED_PATCH | CUTANEOUS | Status: DC

## 2015-07-22 MED ORDER — DICLOFENAC SODIUM 1 % TD GEL: 2.0000 g | Freq: Four times a day (QID) | TRANSDERMAL | Status: DC

## 2015-07-22 NOTE — Telephone Encounter (Signed)
I'm not sure what happened.  Can we order both of them for him. Thanks.

## 2015-07-22 NOTE — Telephone Encounter (Signed)
Meds sent to pharmacy. Advised pt. Also let him know that he will be contacted for xrays.

## 2015-07-22 NOTE — Telephone Encounter (Signed)
I looked in the last office visit note. It does not reflect the orders of Voltaren Gel and the patches. Also, they are not in his medication list. It looks like the orders did not go through properly.

## 2015-07-22 NOTE — Telephone Encounter (Signed)
What would you like the sig to be on the Voltaren Gel?

## 2015-07-22 NOTE — Telephone Encounter (Signed)
Voltaren gel and lidoderm patches were ordered.  I'm not sure which one he did not receive.

## 2015-07-22 NOTE — Telephone Encounter (Signed)
2mg  QID to right lateral deltoid and left anterior shoulder

## 2015-07-23 LAB — TOXASSURE SELECT,+ANTIDEPR,UR: PDF: 0

## 2015-07-24 DIAGNOSIS — L89154 Pressure ulcer of sacral region, stage 4: Secondary | ICD-10-CM | POA: Diagnosis not present

## 2015-07-27 NOTE — Progress Notes (Signed)
Urine drug screen for this encounter is consistent for prescribed medication 

## 2015-07-29 DIAGNOSIS — L89610 Pressure ulcer of right heel, unstageable: Secondary | ICD-10-CM | POA: Diagnosis not present

## 2015-07-29 DIAGNOSIS — L89624 Pressure ulcer of left heel, stage 4: Secondary | ICD-10-CM | POA: Diagnosis not present

## 2015-07-29 DIAGNOSIS — G8253 Quadriplegia, C5-C7 complete: Secondary | ICD-10-CM | POA: Diagnosis not present

## 2015-07-29 DIAGNOSIS — L8944 Pressure ulcer of contiguous site of back, buttock and hip, stage 4: Secondary | ICD-10-CM | POA: Diagnosis not present

## 2015-07-29 DIAGNOSIS — L8943 Pressure ulcer of contiguous site of back, buttock and hip, stage 3: Secondary | ICD-10-CM | POA: Diagnosis not present

## 2015-07-29 DIAGNOSIS — L89890 Pressure ulcer of other site, unstageable: Secondary | ICD-10-CM | POA: Diagnosis not present

## 2015-07-29 DIAGNOSIS — S14109S Unspecified injury at unspecified level of cervical spinal cord, sequela: Secondary | ICD-10-CM | POA: Diagnosis not present

## 2015-07-29 DIAGNOSIS — L89893 Pressure ulcer of other site, stage 3: Secondary | ICD-10-CM | POA: Diagnosis not present

## 2015-07-29 DIAGNOSIS — S20401D Unspecified superficial injuries of right back wall of thorax, subsequent encounter: Secondary | ICD-10-CM | POA: Diagnosis not present

## 2015-07-29 DIAGNOSIS — G40909 Epilepsy, unspecified, not intractable, without status epilepticus: Secondary | ICD-10-CM | POA: Diagnosis not present

## 2015-08-03 DIAGNOSIS — L89894 Pressure ulcer of other site, stage 4: Secondary | ICD-10-CM | POA: Diagnosis not present

## 2015-08-03 DIAGNOSIS — L89154 Pressure ulcer of sacral region, stage 4: Secondary | ICD-10-CM | POA: Diagnosis not present

## 2015-08-11 ENCOUNTER — Encounter (HOSPITAL_COMMUNITY): Payer: Self-pay

## 2015-08-11 ENCOUNTER — Inpatient Hospital Stay (HOSPITAL_COMMUNITY)
Admission: EM | Admit: 2015-08-11 | Discharge: 2015-08-25 | DRG: 853 | Disposition: A | Discharge status: Home Health | Payer: Medicare Other | Attending: Internal Medicine | Admitting: Internal Medicine

## 2015-08-11 ENCOUNTER — Emergency Department (HOSPITAL_COMMUNITY): Payer: Medicare Other

## 2015-08-11 DIAGNOSIS — Z23 Encounter for immunization: Secondary | ICD-10-CM

## 2015-08-11 DIAGNOSIS — L89139 Pressure ulcer of right lower back, unspecified stage: Secondary | ICD-10-CM | POA: Diagnosis not present

## 2015-08-11 DIAGNOSIS — L89324 Pressure ulcer of left buttock, stage 4: Secondary | ICD-10-CM | POA: Diagnosis present

## 2015-08-11 DIAGNOSIS — IMO0002 Reserved for concepts with insufficient information to code with codable children: Secondary | ICD-10-CM | POA: Diagnosis present

## 2015-08-11 DIAGNOSIS — L89154 Pressure ulcer of sacral region, stage 4: Secondary | ICD-10-CM | POA: Diagnosis not present

## 2015-08-11 DIAGNOSIS — L89520 Pressure ulcer of left ankle, unstageable: Secondary | ICD-10-CM | POA: Diagnosis present

## 2015-08-11 DIAGNOSIS — R000 Tachycardia, unspecified: Secondary | ICD-10-CM | POA: Diagnosis present

## 2015-08-11 DIAGNOSIS — R112 Nausea with vomiting, unspecified: Secondary | ICD-10-CM | POA: Diagnosis present

## 2015-08-11 DIAGNOSIS — D509 Iron deficiency anemia, unspecified: Secondary | ICD-10-CM | POA: Diagnosis not present

## 2015-08-11 DIAGNOSIS — E876 Hypokalemia: Secondary | ICD-10-CM | POA: Diagnosis not present

## 2015-08-11 DIAGNOSIS — A419 Sepsis, unspecified organism: Secondary | ICD-10-CM | POA: Diagnosis not present

## 2015-08-11 DIAGNOSIS — I4581 Long QT syndrome: Secondary | ICD-10-CM | POA: Diagnosis present

## 2015-08-11 DIAGNOSIS — L89610 Pressure ulcer of right heel, unstageable: Secondary | ICD-10-CM | POA: Diagnosis present

## 2015-08-11 DIAGNOSIS — Z933 Colostomy status: Secondary | ICD-10-CM

## 2015-08-11 DIAGNOSIS — M4620 Osteomyelitis of vertebra, site unspecified: Secondary | ICD-10-CM | POA: Diagnosis present

## 2015-08-11 DIAGNOSIS — R531 Weakness: Secondary | ICD-10-CM | POA: Diagnosis not present

## 2015-08-11 DIAGNOSIS — Z9359 Other cystostomy status: Secondary | ICD-10-CM

## 2015-08-11 DIAGNOSIS — Z8701 Personal history of pneumonia (recurrent): Secondary | ICD-10-CM

## 2015-08-11 DIAGNOSIS — K56609 Unspecified intestinal obstruction, unspecified as to partial versus complete obstruction: Secondary | ICD-10-CM

## 2015-08-11 DIAGNOSIS — L89314 Pressure ulcer of right buttock, stage 4: Secondary | ICD-10-CM | POA: Diagnosis not present

## 2015-08-11 DIAGNOSIS — T148XXA Other injury of unspecified body region, initial encounter: Secondary | ICD-10-CM

## 2015-08-11 DIAGNOSIS — L89890 Pressure ulcer of other site, unstageable: Secondary | ICD-10-CM | POA: Diagnosis present

## 2015-08-11 DIAGNOSIS — Z833 Family history of diabetes mellitus: Secondary | ICD-10-CM

## 2015-08-11 DIAGNOSIS — L89623 Pressure ulcer of left heel, stage 3: Secondary | ICD-10-CM | POA: Diagnosis not present

## 2015-08-11 DIAGNOSIS — M86659 Other chronic osteomyelitis, unspecified thigh: Secondary | ICD-10-CM | POA: Diagnosis not present

## 2015-08-11 DIAGNOSIS — I10 Essential (primary) hypertension: Secondary | ICD-10-CM | POA: Diagnosis present

## 2015-08-11 DIAGNOSIS — R509 Fever, unspecified: Secondary | ICD-10-CM | POA: Diagnosis present

## 2015-08-11 DIAGNOSIS — B9689 Other specified bacterial agents as the cause of diseases classified elsewhere: Secondary | ICD-10-CM | POA: Diagnosis present

## 2015-08-11 DIAGNOSIS — G8250 Quadriplegia, unspecified: Secondary | ICD-10-CM | POA: Diagnosis present

## 2015-08-11 DIAGNOSIS — K567 Ileus, unspecified: Secondary | ICD-10-CM | POA: Diagnosis not present

## 2015-08-11 DIAGNOSIS — L89319 Pressure ulcer of right buttock, unspecified stage: Secondary | ICD-10-CM | POA: Diagnosis not present

## 2015-08-11 DIAGNOSIS — G4733 Obstructive sleep apnea (adult) (pediatric): Secondary | ICD-10-CM | POA: Diagnosis present

## 2015-08-11 DIAGNOSIS — E43 Unspecified severe protein-calorie malnutrition: Secondary | ICD-10-CM | POA: Diagnosis present

## 2015-08-11 DIAGNOSIS — E662 Morbid (severe) obesity with alveolar hypoventilation: Secondary | ICD-10-CM | POA: Diagnosis not present

## 2015-08-11 DIAGNOSIS — N390 Urinary tract infection, site not specified: Secondary | ICD-10-CM | POA: Diagnosis not present

## 2015-08-11 DIAGNOSIS — Z6827 Body mass index (BMI) 27.0-27.9, adult: Secondary | ICD-10-CM

## 2015-08-11 DIAGNOSIS — B965 Pseudomonas (aeruginosa) (mallei) (pseudomallei) as the cause of diseases classified elsewhere: Secondary | ICD-10-CM | POA: Diagnosis present

## 2015-08-11 DIAGNOSIS — R7881 Bacteremia: Secondary | ICD-10-CM | POA: Diagnosis not present

## 2015-08-11 DIAGNOSIS — L089 Local infection of the skin and subcutaneous tissue, unspecified: Secondary | ICD-10-CM

## 2015-08-11 DIAGNOSIS — M4624 Osteomyelitis of vertebra, thoracic region: Secondary | ICD-10-CM | POA: Diagnosis not present

## 2015-08-11 DIAGNOSIS — S31000A Unspecified open wound of lower back and pelvis without penetration into retroperitoneum, initial encounter: Secondary | ICD-10-CM

## 2015-08-11 DIAGNOSIS — K3189 Other diseases of stomach and duodenum: Secondary | ICD-10-CM | POA: Diagnosis not present

## 2015-08-11 DIAGNOSIS — S2190XA Unspecified open wound of unspecified part of thorax, initial encounter: Secondary | ICD-10-CM

## 2015-08-11 DIAGNOSIS — L8990 Pressure ulcer of unspecified site, unspecified stage: Secondary | ICD-10-CM

## 2015-08-11 DIAGNOSIS — Z8711 Personal history of peptic ulcer disease: Secondary | ICD-10-CM

## 2015-08-11 DIAGNOSIS — A498 Other bacterial infections of unspecified site: Secondary | ICD-10-CM | POA: Diagnosis present

## 2015-08-11 DIAGNOSIS — M8660 Other chronic osteomyelitis, unspecified site: Secondary | ICD-10-CM | POA: Diagnosis present

## 2015-08-11 DIAGNOSIS — M869 Osteomyelitis, unspecified: Secondary | ICD-10-CM | POA: Diagnosis not present

## 2015-08-11 DIAGNOSIS — R404 Transient alteration of awareness: Secondary | ICD-10-CM | POA: Diagnosis not present

## 2015-08-11 DIAGNOSIS — L89214 Pressure ulcer of right hip, stage 4: Secondary | ICD-10-CM | POA: Diagnosis present

## 2015-08-11 DIAGNOSIS — K210 Gastro-esophageal reflux disease with esophagitis: Secondary | ICD-10-CM | POA: Diagnosis present

## 2015-08-11 LAB — COMPREHENSIVE METABOLIC PANEL
ALT: 16 U/L — ABNORMAL LOW (ref 17–63)
ANION GAP: 13 (ref 5–15)
AST: 21 U/L (ref 15–41)
Albumin: 2.5 g/dL — ABNORMAL LOW (ref 3.5–5.0)
Alkaline Phosphatase: 84 U/L (ref 38–126)
BUN: 13 mg/dL (ref 6–20)
CHLORIDE: 105 mmol/L (ref 101–111)
CO2: 24 mmol/L (ref 22–32)
Calcium: 9.2 mg/dL (ref 8.9–10.3)
Creatinine, Ser: 0.98 mg/dL (ref 0.61–1.24)
GFR calc non Af Amer: >60 mL/min (ref >60)
Glucose, Bld: 150 mg/dL — ABNORMAL HIGH (ref 65–99)
POTASSIUM: 3.3 mmol/L — AB (ref 3.5–5.1)
SODIUM: 142 mmol/L (ref 135–145)
Total Bilirubin: 0.3 mg/dL (ref 0.3–1.2)
Total Protein: 8.1 g/dL (ref 6.5–8.1)

## 2015-08-11 LAB — CBC WITH DIFFERENTIAL/PLATELET
BASOS ABS: 0 10*3/uL (ref 0.0–0.1)
BASOS PCT: 0 %
Eosinophils Absolute: 0 10*3/uL (ref 0.0–0.7)
Eosinophils Relative: 0 %
HEMATOCRIT: 31.7 % — AB (ref 39.0–52.0)
HEMOGLOBIN: 9.6 g/dL — AB (ref 13.0–17.0)
LYMPHS ABS: 1 10*3/uL (ref 0.7–4.0)
LYMPHS PCT: 4 %
MCH: 23.9 pg — ABNORMAL LOW (ref 26.0–34.0)
MCHC: 30.3 g/dL (ref 30.0–36.0)
MCV: 79.1 fL (ref 78.0–100.0)
MONOS PCT: 9 %
Monocytes Absolute: 2.1 10*3/uL — ABNORMAL HIGH (ref 0.1–1.0)
NEUTROS ABS: 20.1 10*3/uL — AB (ref 1.7–7.7)
NEUTROS PCT: 87 %
Platelets: 575 10*3/uL — ABNORMAL HIGH (ref 150–400)
RBC: 4.01 MIL/uL — AB (ref 4.22–5.81)
RDW: 16.6 % — ABNORMAL HIGH (ref 11.5–15.5)
WBC: 23.3 10*3/uL — ABNORMAL HIGH (ref 4.0–10.5)

## 2015-08-11 LAB — I-STAT CG4 LACTIC ACID, ED: Lactic Acid, Venous: 2.32 mmol/L (ref 0.5–2.0)

## 2015-08-11 MED ORDER — PIPERACILLIN-TAZOBACTAM 3.375 G IVPB 30 MIN
3.3750 g | Freq: Once | INTRAVENOUS | Status: DC
  Filled 2015-08-11: qty 50

## 2015-08-11 MED ORDER — SODIUM CHLORIDE 0.9 % IV BOLUS (SEPSIS)
1000.0000 mL | INTRAVENOUS | Status: AC
  Administered 2015-08-11: 1000 mL via INTRAVENOUS

## 2015-08-11 MED ORDER — VANCOMYCIN HCL IN DEXTROSE 1-5 GM/200ML-% IV SOLN
1000.0000 mg | Freq: Once | INTRAVENOUS | Status: DC
  Filled 2015-08-11: qty 200

## 2015-08-11 NOTE — Progress Notes (Signed)
Pharmacy Antibiotic Note  [name] [name] is a 50 y.o. quadraplegic male admitted on 08/11/2015 with cellulitis.  Pharmacy has been consulted for Vancomycin and Zosyn dosing. CBC and BMET pending   Pt was hospitalized within the past month for osteomyelitis and infected pressure sore. Pt was prescribed Cefixime and Flagyl by ID on 2/25 and counseled to continue the medications for 10 days. Pt also presented with milk AKI during last admission.   Plan: Vancomycin 1g IV x1 in the ED Zosyn 3.375g IV x1 in the ED (30 min inf) F/U labs and further abx dosing  F/U c/s, renal fxn, LOT, VT @ss   Weight: 197 lb (89.359 kg)  Temp (24hrs), Avg:99.9 F (37.7 C), Min:99.9 F (37.7 C), Max:99.9 F (37.7 C)  No results for input(s): WBC, CREATININE, LATICACIDVEN, VANCOTROUGH, VANCOPEAK, VANCORANDOM, GENTTROUGH, GENTPEAK, GENTRANDOM, TOBRATROUGH, TOBRAPEAK, TOBRARND, AMIKACINPEAK, AMIKACINTROU, AMIKACIN in the last 168 hours.  CrCl cannot be calculated (Patient has no serum creatinine result on file.).    Allergies  Allergen Reactions  . Ditropan [Oxybutynin] Other (See Comments)    hallucinations    Antimicrobials this admission: 3/28 Vanc>> 3/28 Zosyn>>  Thank you for allowing pharmacy to be a part of this patient's care.  Toluwani Yadav C. Lennox Grumbles, PharmD Pharmacy Resident  Pager: [phone] 08/11/2015 9:50 PM

## 2015-08-11 NOTE — ED Notes (Signed)
Pt here for possible sepsis pt is quadriplegic, pt recent admit for sepsis, pt reports light headedness and is htn on arrival to ed.
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
## 2015-08-11 NOTE — ED Notes (Signed)
Iv team at bedside, lab at bedside

## 2015-08-12 ENCOUNTER — Encounter (HOSPITAL_COMMUNITY): Payer: Self-pay | Admitting: Family Medicine

## 2015-08-12 ENCOUNTER — Other Ambulatory Visit: Payer: Self-pay | Admitting: Plastic Surgery

## 2015-08-12 DIAGNOSIS — S2190XA Unspecified open wound of unspecified part of thorax, initial encounter: Secondary | ICD-10-CM

## 2015-08-12 DIAGNOSIS — B9689 Other specified bacterial agents as the cause of diseases classified elsewhere: Secondary | ICD-10-CM | POA: Diagnosis not present

## 2015-08-12 DIAGNOSIS — M4624 Osteomyelitis of vertebra, thoracic region: Secondary | ICD-10-CM | POA: Diagnosis not present

## 2015-08-12 DIAGNOSIS — D500 Iron deficiency anemia secondary to blood loss (chronic): Secondary | ICD-10-CM | POA: Diagnosis not present

## 2015-08-12 DIAGNOSIS — L98424 Non-pressure chronic ulcer of back with necrosis of bone: Secondary | ICD-10-CM | POA: Diagnosis not present

## 2015-08-12 DIAGNOSIS — I10 Essential (primary) hypertension: Secondary | ICD-10-CM | POA: Diagnosis present

## 2015-08-12 DIAGNOSIS — K567 Ileus, unspecified: Secondary | ICD-10-CM | POA: Diagnosis not present

## 2015-08-12 DIAGNOSIS — S31000A Unspecified open wound of lower back and pelvis without penetration into retroperitoneum, initial encounter: Secondary | ICD-10-CM

## 2015-08-12 DIAGNOSIS — L89154 Pressure ulcer of sacral region, stage 4: Secondary | ICD-10-CM

## 2015-08-12 DIAGNOSIS — Z23 Encounter for immunization: Secondary | ICD-10-CM | POA: Diagnosis not present

## 2015-08-12 DIAGNOSIS — L89520 Pressure ulcer of left ankle, unstageable: Secondary | ICD-10-CM | POA: Diagnosis present

## 2015-08-12 DIAGNOSIS — R509 Fever, unspecified: Secondary | ICD-10-CM | POA: Diagnosis present

## 2015-08-12 DIAGNOSIS — I4581 Long QT syndrome: Secondary | ICD-10-CM | POA: Diagnosis present

## 2015-08-12 DIAGNOSIS — L89610 Pressure ulcer of right heel, unstageable: Secondary | ICD-10-CM | POA: Diagnosis present

## 2015-08-12 DIAGNOSIS — K3184 Gastroparesis: Secondary | ICD-10-CM | POA: Diagnosis not present

## 2015-08-12 DIAGNOSIS — R112 Nausea with vomiting, unspecified: Secondary | ICD-10-CM | POA: Diagnosis not present

## 2015-08-12 DIAGNOSIS — G8250 Quadriplegia, unspecified: Secondary | ICD-10-CM | POA: Diagnosis not present

## 2015-08-12 DIAGNOSIS — E876 Hypokalemia: Secondary | ICD-10-CM | POA: Diagnosis not present

## 2015-08-12 DIAGNOSIS — L89623 Pressure ulcer of left heel, stage 3: Secondary | ICD-10-CM | POA: Diagnosis not present

## 2015-08-12 DIAGNOSIS — R531 Weakness: Secondary | ICD-10-CM | POA: Diagnosis present

## 2015-08-12 DIAGNOSIS — M25512 Pain in left shoulder: Secondary | ICD-10-CM | POA: Diagnosis not present

## 2015-08-12 DIAGNOSIS — L89890 Pressure ulcer of other site, unstageable: Secondary | ICD-10-CM | POA: Diagnosis present

## 2015-08-12 DIAGNOSIS — L89314 Pressure ulcer of right buttock, stage 4: Secondary | ICD-10-CM | POA: Diagnosis not present

## 2015-08-12 DIAGNOSIS — B965 Pseudomonas (aeruginosa) (mallei) (pseudomallei) as the cause of diseases classified elsewhere: Secondary | ICD-10-CM | POA: Diagnosis not present

## 2015-08-12 DIAGNOSIS — Z9359 Other cystostomy status: Secondary | ICD-10-CM

## 2015-08-12 DIAGNOSIS — D509 Iron deficiency anemia, unspecified: Secondary | ICD-10-CM | POA: Diagnosis not present

## 2015-08-12 DIAGNOSIS — R1084 Generalized abdominal pain: Secondary | ICD-10-CM | POA: Diagnosis not present

## 2015-08-12 DIAGNOSIS — R000 Tachycardia, unspecified: Secondary | ICD-10-CM | POA: Diagnosis present

## 2015-08-12 DIAGNOSIS — K210 Gastro-esophageal reflux disease with esophagitis: Secondary | ICD-10-CM | POA: Diagnosis present

## 2015-08-12 DIAGNOSIS — K3189 Other diseases of stomach and duodenum: Secondary | ICD-10-CM | POA: Diagnosis not present

## 2015-08-12 DIAGNOSIS — Z452 Encounter for adjustment and management of vascular access device: Secondary | ICD-10-CM | POA: Diagnosis not present

## 2015-08-12 DIAGNOSIS — N390 Urinary tract infection, site not specified: Secondary | ICD-10-CM | POA: Diagnosis not present

## 2015-08-12 DIAGNOSIS — Z933 Colostomy status: Secondary | ICD-10-CM | POA: Diagnosis not present

## 2015-08-12 DIAGNOSIS — Z8701 Personal history of pneumonia (recurrent): Secondary | ICD-10-CM | POA: Diagnosis not present

## 2015-08-12 DIAGNOSIS — A419 Sepsis, unspecified organism: Principal | ICD-10-CM

## 2015-08-12 DIAGNOSIS — L89214 Pressure ulcer of right hip, stage 4: Secondary | ICD-10-CM | POA: Diagnosis not present

## 2015-08-12 DIAGNOSIS — K5660 Unspecified intestinal obstruction: Secondary | ICD-10-CM | POA: Diagnosis not present

## 2015-08-12 DIAGNOSIS — Z833 Family history of diabetes mellitus: Secondary | ICD-10-CM | POA: Diagnosis not present

## 2015-08-12 DIAGNOSIS — R933 Abnormal findings on diagnostic imaging of other parts of digestive tract: Secondary | ICD-10-CM | POA: Diagnosis not present

## 2015-08-12 DIAGNOSIS — M4620 Osteomyelitis of vertebra, site unspecified: Secondary | ICD-10-CM | POA: Diagnosis not present

## 2015-08-12 DIAGNOSIS — B999 Unspecified infectious disease: Secondary | ICD-10-CM | POA: Diagnosis not present

## 2015-08-12 DIAGNOSIS — E43 Unspecified severe protein-calorie malnutrition: Secondary | ICD-10-CM | POA: Diagnosis not present

## 2015-08-12 DIAGNOSIS — G4733 Obstructive sleep apnea (adult) (pediatric): Secondary | ICD-10-CM | POA: Diagnosis present

## 2015-08-12 DIAGNOSIS — L89159 Pressure ulcer of sacral region, unspecified stage: Secondary | ICD-10-CM | POA: Diagnosis not present

## 2015-08-12 DIAGNOSIS — E662 Morbid (severe) obesity with alveolar hypoventilation: Secondary | ICD-10-CM | POA: Diagnosis not present

## 2015-08-12 DIAGNOSIS — R7881 Bacteremia: Secondary | ICD-10-CM | POA: Diagnosis not present

## 2015-08-12 DIAGNOSIS — Z8711 Personal history of peptic ulcer disease: Secondary | ICD-10-CM | POA: Diagnosis not present

## 2015-08-12 DIAGNOSIS — M869 Osteomyelitis, unspecified: Secondary | ICD-10-CM | POA: Diagnosis not present

## 2015-08-12 DIAGNOSIS — M8660 Other chronic osteomyelitis, unspecified site: Secondary | ICD-10-CM | POA: Diagnosis present

## 2015-08-12 DIAGNOSIS — Z6827 Body mass index (BMI) 27.0-27.9, adult: Secondary | ICD-10-CM | POA: Diagnosis not present

## 2015-08-12 DIAGNOSIS — L89324 Pressure ulcer of left buttock, stage 4: Secondary | ICD-10-CM | POA: Diagnosis not present

## 2015-08-12 DIAGNOSIS — M86659 Other chronic osteomyelitis, unspecified thigh: Secondary | ICD-10-CM | POA: Diagnosis not present

## 2015-08-12 DIAGNOSIS — M8668 Other chronic osteomyelitis, other site: Secondary | ICD-10-CM | POA: Diagnosis not present

## 2015-08-12 DIAGNOSIS — K219 Gastro-esophageal reflux disease without esophagitis: Secondary | ICD-10-CM | POA: Diagnosis not present

## 2015-08-12 LAB — URINALYSIS, ROUTINE W REFLEX MICROSCOPIC
Bilirubin Urine: NEGATIVE
GLUCOSE, UA: NEGATIVE mg/dL
KETONES UR: NEGATIVE mg/dL
Nitrite: NEGATIVE
PROTEIN: 100 mg/dL — AB
Specific Gravity, Urine: 1.017 (ref 1.005–1.030)
pH: 6 (ref 5.0–8.0)

## 2015-08-12 LAB — COMPREHENSIVE METABOLIC PANEL
ALK PHOS: 63 U/L (ref 38–126)
ALT: 12 U/L — AB (ref 17–63)
AST: 19 U/L (ref 15–41)
Albumin: 2 g/dL — ABNORMAL LOW (ref 3.5–5.0)
Anion gap: 9 (ref 5–15)
BILIRUBIN TOTAL: 0.5 mg/dL (ref 0.3–1.2)
BUN: 14 mg/dL (ref 6–20)
CHLORIDE: 109 mmol/L (ref 101–111)
CO2: 23 mmol/L (ref 22–32)
CREATININE: 0.78 mg/dL (ref 0.61–1.24)
Calcium: 8.4 mg/dL — ABNORMAL LOW (ref 8.9–10.3)
GFR calc Af Amer: >60 mL/min (ref >60)
Glucose, Bld: 96 mg/dL (ref 65–99)
Potassium: 3.4 mmol/L — ABNORMAL LOW (ref 3.5–5.1)
Sodium: 141 mmol/L (ref 135–145)
Total Protein: 5.9 g/dL — ABNORMAL LOW (ref 6.5–8.1)

## 2015-08-12 LAB — CBC
HCT: 24.2 % — ABNORMAL LOW (ref 39.0–52.0)
Hemoglobin: 7.4 g/dL — ABNORMAL LOW (ref 13.0–17.0)
MCH: 24.4 pg — ABNORMAL LOW (ref 26.0–34.0)
MCHC: 30.6 g/dL (ref 30.0–36.0)
MCV: 79.9 fL (ref 78.0–100.0)
PLATELETS: 485 10*3/uL — AB (ref 150–400)
RBC: 3.03 MIL/uL — ABNORMAL LOW (ref 4.22–5.81)
RDW: 16.8 % — AB (ref 11.5–15.5)
WBC: 17 10*3/uL — AB (ref 4.0–10.5)

## 2015-08-12 LAB — MAGNESIUM: Magnesium: 1.5 mg/dL — ABNORMAL LOW (ref 1.7–2.4)

## 2015-08-12 LAB — URINE MICROSCOPIC-ADD ON

## 2015-08-12 LAB — LACTIC ACID, PLASMA
LACTIC ACID, VENOUS: 1.2 mmol/L (ref 0.5–2.0)
Lactic Acid, Venous: 0.9 mmol/L (ref 0.5–2.0)

## 2015-08-12 LAB — I-STAT CG4 LACTIC ACID, ED: Lactic Acid, Venous: 2.58 mmol/L (ref 0.5–2.0)

## 2015-08-12 LAB — MRSA PCR SCREENING: MRSA by PCR: NEGATIVE

## 2015-08-12 MED ORDER — ACETAMINOPHEN 650 MG RE SUPP: 650.0000 mg | Freq: Four times a day (QID) | RECTAL | Status: DC | PRN

## 2015-08-12 MED ORDER — BOOST PO LIQD
237.0000 mL | Freq: Three times a day (TID) | ORAL | Status: DC
  Administered 2015-08-12: 237 mL via ORAL
  Filled 2015-08-12 (×8): qty 237

## 2015-08-12 MED ORDER — SODIUM CHLORIDE 0.9% FLUSH: 3.0000 mL | Freq: Two times a day (BID) | INTRAVENOUS | Status: DC

## 2015-08-12 MED ORDER — IBUPROFEN 200 MG PO TABS
800.0000 mg | ORAL_TABLET | Freq: Once | ORAL | Status: AC
  Administered 2015-08-12: 800 mg via ORAL
  Filled 2015-08-12: qty 4

## 2015-08-12 MED ORDER — COLLAGENASE 250 UNIT/GM EX OINT: TOPICAL_OINTMENT | Freq: Every day | CUTANEOUS | Status: DC

## 2015-08-12 MED ORDER — SODIUM CHLORIDE 0.9 % IV SOLN
INTRAVENOUS | Status: AC
  Administered 2015-08-12: 04:00:00 via INTRAVENOUS

## 2015-08-12 MED ORDER — ONDANSETRON HCL 4 MG/2ML IJ SOLN
4.0000 mg | Freq: Four times a day (QID) | INTRAMUSCULAR | Status: DC | PRN
  Administered 2015-08-16 – 2015-08-17 (×3): 4 mg via INTRAVENOUS
  Filled 2015-08-12 (×2): qty 2

## 2015-08-12 MED ORDER — BACLOFEN 20 MG PO TABS
20.0000 mg | ORAL_TABLET | Freq: Four times a day (QID) | ORAL | Status: DC
  Administered 2015-08-12 – 2015-08-25 (×37): 20 mg via ORAL
  Filled 2015-08-12 (×39): qty 1

## 2015-08-12 MED ORDER — METOCLOPRAMIDE HCL 10 MG PO TABS
10.0000 mg | ORAL_TABLET | Freq: Three times a day (TID) | ORAL | Status: DC
  Administered 2015-08-12 – 2015-08-17 (×15): 10 mg via ORAL
  Filled 2015-08-12 (×16): qty 1

## 2015-08-12 MED ORDER — HYDROCODONE-ACETAMINOPHEN 5-325 MG PO TABS
1.0000 | ORAL_TABLET | ORAL | Status: DC | PRN
  Administered 2015-08-17: 1 via ORAL
  Filled 2015-08-12 (×2): qty 1

## 2015-08-12 MED ORDER — SODIUM CHLORIDE 0.9 % IV BOLUS (SEPSIS)
1000.0000 mL | Freq: Once | INTRAVENOUS | Status: AC
  Administered 2015-08-12: 1000 mL via INTRAVENOUS

## 2015-08-12 MED ORDER — DICLOFENAC SODIUM 1 % TD GEL
2.0000 g | Freq: Four times a day (QID) | TRANSDERMAL | Status: DC
  Filled 2015-08-12: qty 100

## 2015-08-12 MED ORDER — POTASSIUM CHLORIDE CRYS ER 20 MEQ PO TBCR
20.0000 meq | EXTENDED_RELEASE_TABLET | Freq: Two times a day (BID) | ORAL | Status: AC
  Administered 2015-08-12 (×2): 20 meq via ORAL
  Filled 2015-08-12 (×3): qty 1

## 2015-08-12 MED ORDER — ACETAMINOPHEN 325 MG PO TABS
650.0000 mg | ORAL_TABLET | Freq: Four times a day (QID) | ORAL | Status: DC | PRN
  Administered 2015-08-12 – 2015-08-23 (×6): 650 mg via ORAL
  Filled 2015-08-12 (×6): qty 2

## 2015-08-12 MED ORDER — FERROUS SULFATE 325 (65 FE) MG PO TABS
325.0000 mg | ORAL_TABLET | Freq: Three times a day (TID) | ORAL | Status: DC
  Administered 2015-08-12 – 2015-08-17 (×14): 325 mg via ORAL
  Filled 2015-08-12 (×16): qty 1

## 2015-08-12 MED ORDER — JUVEN PO PACK
1.0000 | PACK | Freq: Two times a day (BID) | ORAL | Status: DC
  Administered 2015-08-13 – 2015-08-22 (×5): 1 via ORAL
  Filled 2015-08-12 (×29): qty 1

## 2015-08-12 MED ORDER — DARIFENACIN HYDROBROMIDE ER 15 MG PO TB24
15.0000 mg | ORAL_TABLET | Freq: Every day | ORAL | Status: DC
  Administered 2015-08-12 – 2015-08-25 (×9): 15 mg via ORAL
  Filled 2015-08-12 (×14): qty 1

## 2015-08-12 MED ORDER — DOCUSATE SODIUM 100 MG PO CAPS
200.0000 mg | ORAL_CAPSULE | Freq: Two times a day (BID) | ORAL | Status: DC
  Administered 2015-08-12 – 2015-08-16 (×10): 200 mg via ORAL
  Filled 2015-08-12 (×11): qty 2

## 2015-08-12 MED ORDER — PANTOPRAZOLE SODIUM 40 MG PO TBEC
40.0000 mg | DELAYED_RELEASE_TABLET | Freq: Two times a day (BID) | ORAL | Status: DC
  Administered 2015-08-12 – 2015-08-17 (×11): 40 mg via ORAL
  Filled 2015-08-12 (×12): qty 1

## 2015-08-12 MED ORDER — COLLAGENASE 250 UNIT/GM EX OINT
TOPICAL_OINTMENT | Freq: Every day | CUTANEOUS | Status: DC
  Administered 2015-08-12: 16:00:00 via TOPICAL
  Administered 2015-08-13: 1 via TOPICAL
  Administered 2015-08-14: 2 via TOPICAL
  Administered 2015-08-15 – 2015-08-25 (×11): via TOPICAL
  Filled 2015-08-12 (×8): qty 30

## 2015-08-12 MED ORDER — SODIUM CHLORIDE 0.9 % IV BOLUS (SEPSIS)
500.0000 mL | Freq: Once | INTRAVENOUS | Status: AC
  Administered 2015-08-12: 500 mL via INTRAVENOUS

## 2015-08-12 MED ORDER — DICLOFENAC SODIUM 1 % TD GEL
2.0000 g | Freq: Four times a day (QID) | TRANSDERMAL | Status: DC
  Administered 2015-08-13 – 2015-08-25 (×15): 2 g via TOPICAL
  Filled 2015-08-12 (×2): qty 100

## 2015-08-12 MED ORDER — SENNOSIDES-DOCUSATE SODIUM 8.6-50 MG PO TABS
1.0000 | ORAL_TABLET | Freq: Every evening | ORAL | Status: DC | PRN
  Filled 2015-08-12: qty 1

## 2015-08-12 MED ORDER — MAGNESIUM OXIDE 400 (241.3 MG) MG PO TABS
400.0000 mg | ORAL_TABLET | Freq: Every day | ORAL | Status: DC
  Administered 2015-08-12 – 2015-08-25 (×9): 400 mg via ORAL
  Filled 2015-08-12 (×10): qty 1

## 2015-08-12 MED ORDER — ONDANSETRON HCL 4 MG PO TABS: 4.0000 mg | ORAL_TABLET | Freq: Four times a day (QID) | ORAL | Status: DC | PRN

## 2015-08-12 MED ORDER — SUCRALFATE 1 G PO TABS
1.0000 g | ORAL_TABLET | Freq: Four times a day (QID) | ORAL | Status: DC
  Administered 2015-08-12 – 2015-08-22 (×25): 1 g via ORAL
  Filled 2015-08-12 (×26): qty 1

## 2015-08-12 NOTE — Progress Notes (Addendum)
Pt arrived to [facility], pt placed on low air loss overlay with rotation q 30. Vital signs WNL. Cherly Beach as second RN assisting with patient skin assessment. Pt has multiple decubiti. Sacral wound stage IV, wet to dry dressing reinforced. Lateral right stage IV wound redressed with wet to dry packing. Bilateral lower extremity calf decubiti wounds covered with allevyn dressings. Patient oriented to unit and room. Pt appears to be resting comfortably, will continue to monitor.

## 2015-08-12 NOTE — ED Notes (Signed)
1 blood culture in lab, initial bolus of 1000 ns infusing.

## 2015-08-12 NOTE — ED Notes (Signed)
Fluids Initiated and Per ED PA awaiitng call and decision of ID MD to start ABX

## 2015-08-12 NOTE — Consult Note (Addendum)
WOC consult requested for multiple wounds.  Pt is familiar to [facility] team from recent admission, refer to progress notes on 2/22.  Pt is currently in the ER and it would be difficult to assess wounds.  Will perform consult once he is admitted to a room.   Patient is followed by Dr. [name] (Plastics) as an outpatient. It is recommend that he continue to follow up with her for direction and POC in wound care following discharge.  Stoma type/location: LLQ Colostomy Ostomy pouching: 2pc., 2 and 3/4 inch pouching system with skin barrier ring  Patient's caregiver manages ostomy. Orders are provided for in-house nursing staff.  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 It is challenging to articular the anatomical areas involved as patient has had numerous plastic surgery interventions that have significantly altered landmarks.  Left heel: Stage 3 pressure injury  Left lateral malleolus: Unstageable pressure injury  Right medial heel: Unstageable pressure injury measuring  Right lateral LE: Unstageable pressure injury Right lateral trunk: Unstageable pressure injury  Dressing procedure/placement/frequency: Please order air mattress when pt is transferred to the floor to reduce pressure. Float the heels. Patient uses collagen dressings at home; he is aware that we do not use that here in acute care from previous visit. We will use the collagenase ointment to the area of necrotic tissue in the sacral area/ischial tuberosity, left LE and the right lateral trunk. Moist gauze packing to other wounds daily. Julien Girt MSN, RN, [place], [place], [place]

## 2015-08-12 NOTE — ED Notes (Signed)
Dr danford states to hold urine until cath is replaced.

## 2015-08-12 NOTE — ED Notes (Signed)
Urine held currently due to long term suprapubic cath inplace
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
## 2015-08-12 NOTE — ED Notes (Signed)
Breakfast tray ordered 

## 2015-08-12 NOTE — ED Notes (Signed)
RN and NT assisting admitting MD to assess sacral wound. Pt has large, extensive sacral wound. Packing removed for assessment, packing saturated with yellow exudate. About 7 cm dept to left sacral, would is very extensive to entire sacrum. Wound re-packed with wet gauze, ABD pads placed on top.

## 2015-08-12 NOTE — ED Notes (Signed)
RN's at bedside changing catheter

## 2015-08-12 NOTE — ED Notes (Signed)
Delay in hanging medications due to lack of IV access and MD in ed stating to hold off on fluids and abx until speaking with ID MD

## 2015-08-12 NOTE — Progress Notes (Signed)
   08/12/15 2259  Vitals  Temp (!) 103 F (39.4 C)  Temp Source Oral  BP (!) 91/48 mmHg  BP Location Left Arm  BP Method Automatic  Patient Position (if appropriate) Lying  Pulse Rate (!) 113  Pulse Rate Source Monitor  ECG Heart Rate (!) 113  Resp (!) 30  Paged K sckoor about above temp and BP after 650 tylenol she ordered 500 cc bolus and 800 ibuprophen
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
## 2015-08-12 NOTE — ED Notes (Signed)
RN from 4W to come replace suprapubic, all supplies at bedside.

## 2015-08-13 ENCOUNTER — Inpatient Hospital Stay (HOSPITAL_COMMUNITY): Payer: Medicare Other | Admitting: Certified Registered Nurse Anesthetist

## 2015-08-13 ENCOUNTER — Encounter (HOSPITAL_COMMUNITY)
Admission: EM | Disposition: A | Discharge status: Home Health | Payer: Self-pay | Source: Home / Self Care | Attending: Internal Medicine

## 2015-08-13 ENCOUNTER — Encounter (HOSPITAL_COMMUNITY): Payer: Self-pay | Admitting: Plastic Surgery

## 2015-08-13 HISTORY — PX: DRESSING CHANGE UNDER ANESTHESIA: SHX5237

## 2015-08-13 HISTORY — PX: INCISION AND DRAINAGE OF WOUND: SHX1803

## 2015-08-13 LAB — PREPARE RBC (CROSSMATCH)

## 2015-08-13 SURGERY — IRRIGATION AND DEBRIDEMENT WOUND
Anesthesia: General | Site: Back | Laterality: Right

## 2015-08-13 MED ORDER — ONDANSETRON HCL 4 MG/2ML IJ SOLN
INTRAMUSCULAR | Status: DC | PRN
  Administered 2015-08-13: 4 mg via INTRAVENOUS

## 2015-08-13 MED ORDER — GLYCOPYRROLATE 0.2 MG/ML IJ SOLN
INTRAMUSCULAR | Status: DC | PRN
  Administered 2015-08-13: 0.6 mg via INTRAVENOUS

## 2015-08-13 MED ORDER — ADULT MULTIVITAMIN W/MINERALS CH
1.0000 | ORAL_TABLET | Freq: Every day | ORAL | Status: DC
  Administered 2015-08-14 – 2015-08-16 (×3): 1 via ORAL
  Filled 2015-08-13 (×4): qty 1

## 2015-08-13 MED ORDER — FENTANYL CITRATE (PF) 250 MCG/5ML IJ SOLN
INTRAMUSCULAR | Status: AC
  Filled 2015-08-13: qty 5

## 2015-08-13 MED ORDER — MIDAZOLAM HCL 2 MG/2ML IJ SOLN
INTRAMUSCULAR | Status: AC
  Filled 2015-08-13: qty 2

## 2015-08-13 MED ORDER — LIDOCAINE HCL (CARDIAC) 20 MG/ML IV SOLN
INTRAVENOUS | Status: DC | PRN
  Administered 2015-08-13: 60 mg via INTRAVENOUS

## 2015-08-13 MED ORDER — MIDAZOLAM HCL 5 MG/5ML IJ SOLN
INTRAMUSCULAR | Status: DC | PRN
  Administered 2015-08-13 (×2): 1 mg via INTRAVENOUS

## 2015-08-13 MED ORDER — SODIUM CHLORIDE 0.9 % IR SOLN
Status: DC | PRN
  Administered 2015-08-13: 1000 mL

## 2015-08-13 MED ORDER — GLYCOPYRROLATE 0.2 MG/ML IJ SOLN
INTRAMUSCULAR | Status: AC
  Filled 2015-08-13: qty 3

## 2015-08-13 MED ORDER — NEOSTIGMINE METHYLSULFATE 10 MG/10ML IV SOLN
INTRAVENOUS | Status: AC
  Filled 2015-08-13: qty 1

## 2015-08-13 MED ORDER — FENTANYL CITRATE (PF) 100 MCG/2ML IJ SOLN: 25.0000 ug | INTRAMUSCULAR | Status: DC | PRN

## 2015-08-13 MED ORDER — OXYCODONE HCL 5 MG PO TABS: 5.0000 mg | ORAL_TABLET | Freq: Once | ORAL | Status: DC | PRN

## 2015-08-13 MED ORDER — CETYLPYRIDINIUM CHLORIDE 0.05 % MT LIQD
7.0000 mL | Freq: Two times a day (BID) | OROMUCOSAL | Status: DC
  Administered 2015-08-13 – 2015-08-25 (×19): 7 mL via OROMUCOSAL

## 2015-08-13 MED ORDER — PHENYLEPHRINE HCL 10 MG/ML IJ SOLN
INTRAMUSCULAR | Status: DC | PRN
  Administered 2015-08-13 (×4): 80 ug via INTRAVENOUS
  Administered 2015-08-13: 120 ug via INTRAVENOUS
  Administered 2015-08-13: 80 ug via INTRAVENOUS

## 2015-08-13 MED ORDER — ROCURONIUM BROMIDE 50 MG/5ML IV SOLN
INTRAVENOUS | Status: AC
  Filled 2015-08-13: qty 1

## 2015-08-13 MED ORDER — ACETAMINOPHEN 325 MG PO TABS: 325.0000 mg | ORAL_TABLET | ORAL | Status: DC | PRN

## 2015-08-13 MED ORDER — BOOST PLUS PO LIQD
237.0000 mL | Freq: Three times a day (TID) | ORAL | Status: DC
  Administered 2015-08-13 – 2015-08-25 (×15): 237 mL via ORAL
  Filled 2015-08-13 (×40): qty 237

## 2015-08-13 MED ORDER — SODIUM CHLORIDE 0.9 % IV SOLN
INTRAVENOUS | Status: DC
  Administered 2015-08-13 – 2015-08-15 (×6): via INTRAVENOUS

## 2015-08-13 MED ORDER — SODIUM CHLORIDE 0.9 % IR SOLN
Status: DC | PRN
  Administered 2015-08-13: 500 mL

## 2015-08-13 MED ORDER — ARTIFICIAL TEARS OP OINT
TOPICAL_OINTMENT | OPHTHALMIC | Status: AC
  Filled 2015-08-13: qty 3.5

## 2015-08-13 MED ORDER — PROPOFOL 10 MG/ML IV BOLUS
INTRAVENOUS | Status: AC
  Filled 2015-08-13: qty 40

## 2015-08-13 MED ORDER — PNEUMOCOCCAL VAC POLYVALENT 25 MCG/0.5ML IJ INJ
0.5000 mL | INJECTION | INTRAMUSCULAR | Status: AC
  Administered 2015-08-14: 0.5 mL via INTRAMUSCULAR
  Filled 2015-08-13: qty 0.5
  Filled 2015-08-13: qty 1

## 2015-08-13 MED ORDER — PIPERACILLIN-TAZOBACTAM 3.375 G IVPB
3.3750 g | INTRAVENOUS | Status: DC
  Administered 2015-08-13: 3.375 g via INTRAVENOUS
  Filled 2015-08-13: qty 50

## 2015-08-13 MED ORDER — VANCOMYCIN HCL IN DEXTROSE 1-5 GM/200ML-% IV SOLN
1000.0000 mg | INTRAVENOUS | Status: AC
  Administered 2015-08-13: 1000 mg via INTRAVENOUS
  Filled 2015-08-13: qty 200

## 2015-08-13 MED ORDER — OXYCODONE HCL 5 MG/5ML PO SOLN: 5.0000 mg | Freq: Once | ORAL | Status: DC | PRN

## 2015-08-13 MED ORDER — PHENYLEPHRINE HCL 10 MG/ML IJ SOLN
10.0000 mg | INTRAVENOUS | Status: DC | PRN
  Administered 2015-08-13: 40 ug/min via INTRAVENOUS

## 2015-08-13 MED ORDER — ROCURONIUM BROMIDE 100 MG/10ML IV SOLN
INTRAVENOUS | Status: DC | PRN
  Administered 2015-08-13: 40 mg via INTRAVENOUS

## 2015-08-13 MED ORDER — NEOSTIGMINE METHYLSULFATE 10 MG/10ML IV SOLN
INTRAVENOUS | Status: DC | PRN
  Administered 2015-08-13: 4 mg via INTRAVENOUS

## 2015-08-13 MED ORDER — ACETAMINOPHEN 160 MG/5ML PO SOLN: 325.0000 mg | ORAL | Status: DC | PRN

## 2015-08-13 MED ORDER — LACTATED RINGERS IV SOLN
INTRAVENOUS | Status: DC | PRN
  Administered 2015-08-13: 13:00:00 via INTRAVENOUS

## 2015-08-13 MED ORDER — SODIUM CHLORIDE 0.9 % IV SOLN: Freq: Once | INTRAVENOUS | Status: DC

## 2015-08-13 MED ORDER — ONDANSETRON HCL 4 MG/2ML IJ SOLN
INTRAMUSCULAR | Status: AC
  Filled 2015-08-13: qty 2

## 2015-08-13 MED ORDER — PROPOFOL 10 MG/ML IV BOLUS
INTRAVENOUS | Status: DC | PRN
  Administered 2015-08-13: 30 mg via INTRAVENOUS
  Administered 2015-08-13: 130 mg via INTRAVENOUS

## 2015-08-13 SURGICAL SUPPLY — 40 items
APL SKNCLS STERI-STRIP NONHPOA (GAUZE/BANDAGES/DRESSINGS) ×2
BAG DECANTER FOR FLEXI CONT (MISCELLANEOUS) ×1 IMPLANT
BENZOIN TINCTURE PRP APPL 2/3 (GAUZE/BANDAGES/DRESSINGS) ×3 IMPLANT
BNDG GAUZE ELAST 4 BULKY (GAUZE/BANDAGES/DRESSINGS) ×2 IMPLANT
CANISTER SUCTION 2500CC (MISCELLANEOUS) ×3 IMPLANT
CANISTER WOUND CARE 500ML ATS (WOUND CARE) ×1 IMPLANT
CONT SPEC 4OZ CLIKSEAL STRL BL (MISCELLANEOUS) ×4 IMPLANT
COVER SURGICAL LIGHT HANDLE (MISCELLANEOUS) ×3 IMPLANT
DRAPE IMP U-DRAPE 54X76 (DRAPES) ×3 IMPLANT
DRAPE INCISE IOBAN 66X45 STRL (DRAPES) ×2 IMPLANT
DRAPE LAPAROSCOPIC ABDOMINAL (DRAPES) IMPLANT
DRESSING HYDROCOLLOID 4X4 (GAUZE/BANDAGES/DRESSINGS) ×3 IMPLANT
DRSG CUTIMED SORBACT 7X9 (GAUZE/BANDAGES/DRESSINGS) ×1 IMPLANT
DRSG VAC ATS MED SENSATRAC (GAUZE/BANDAGES/DRESSINGS) ×1 IMPLANT
ELECT REM PT RETURN 9FT ADLT (ELECTROSURGICAL) ×3
ELECTRODE REM PT RTRN 9FT ADLT (ELECTROSURGICAL) ×2 IMPLANT
GAUZE SPONGE 4X4 12PLY STRL (GAUZE/BANDAGES/DRESSINGS) IMPLANT
GLOVE BIO SURGEON STRL SZ 6.5 (GLOVE) ×6 IMPLANT
GOWN STRL REUS W/ TWL LRG LVL3 (GOWN DISPOSABLE) ×6 IMPLANT
GOWN STRL REUS W/TWL LRG LVL3 (GOWN DISPOSABLE) ×9
KIT BASIN OR (CUSTOM PROCEDURE TRAY) ×3 IMPLANT
KIT ROOM TURNOVER OR (KITS) ×3 IMPLANT
MATRIX SURGICAL PSM 10X15CM (Tissue) ×1 IMPLANT
MICROMATRIX 1000MG (Tissue) ×3 IMPLANT
NS IRRIG 1000ML POUR BTL (IV SOLUTION) ×3 IMPLANT
PACK GENERAL/GYN (CUSTOM PROCEDURE TRAY) ×3 IMPLANT
PACK UNIVERSAL I (CUSTOM PROCEDURE TRAY) ×3 IMPLANT
PAD ABD 8X10 STRL (GAUZE/BANDAGES/DRESSINGS) ×1 IMPLANT
PAD ARMBOARD 7.5X6 YLW CONV (MISCELLANEOUS) ×6 IMPLANT
SOLUTION PARTIC MCRMTRX 1000MG (Tissue) IMPLANT
STAPLER VISISTAT 35W (STAPLE) ×3 IMPLANT
SURGILUBE 2OZ TUBE FLIPTOP (MISCELLANEOUS) IMPLANT
SUT VIC AB 5-0 PS2 18 (SUTURE) ×2 IMPLANT
SUT VICRYL 4-0 PS2 18IN ABS (SUTURE) ×1 IMPLANT
SWAB COLLECTION DEVICE MRSA (MISCELLANEOUS) IMPLANT
SWAB CULTURE ESWAB REG 1ML (MISCELLANEOUS) IMPLANT
TAPE PAPER 3X10 WHT MICROPORE (GAUZE/BANDAGES/DRESSINGS) ×1 IMPLANT
TOWEL OR 17X26 10 PK STRL BLUE (TOWEL DISPOSABLE) ×3 IMPLANT
TUBE ANAEROBIC SPECIMEN COL (MISCELLANEOUS) IMPLANT
UNDERPAD 30X30 INCONTINENT (UNDERPADS AND DIAPERS) ×3 IMPLANT

## 2015-08-13 NOTE — Progress Notes (Signed)
   08/12/15 2346  Vitals  Temp (!) 102.8 F (39.3 C)  Pulse Rate (!) 114  Resp (!) 28  cooling blanket placed on patient

## 2015-08-13 NOTE — Progress Notes (Signed)
Placed on tele to monitor  

## 2015-08-13 NOTE — Brief Op Note (Addendum)
08/11/2015 - 08/13/2015  1:40 PM  PATIENT:  [name]  50 y.o. male  PRE-OPERATIVE DIAGNOSIS:  Right thorax ulcer and sacral ulcers  POST-OPERATIVE DIAGNOSIS:  same  PROCEDURE:  Procedure(s): IRRIGATION AND DEBRIDEMENT WOUND (Right)  SURGEON:  Surgeon(s) and Role:    * Claire S Dillingham, DO - Primary  ASSISTANTS: none   ANESTHESIA:   general  EBL:  Total I/O In: 625 [I.V.:625] Out: 751 [Urine:750; Stool:1]  BLOOD ADMINISTERED:none  DRAINS: none   LOCAL MEDICATIONS USED:  NONE  SPECIMEN:  Source of Specimen:  right rib wound  DISPOSITION OF SPECIMEN:  micro  COUNTS:  YES  TOURNIQUET:  * No tourniquets in log *  DICTATION: .Dragon Dictation  PLAN OF CARE: Admit to inpatient   PATIENT DISPOSITION:  PACU - hemodynamically stable.   Delay start of Pharmacological VTE agent (>24hrs) due to surgical blood loss or risk of bleeding: no

## 2015-08-13 NOTE — Progress Notes (Addendum)
   08/13/15 0251  Vitals  Temp 99.8 F (37.7 C)  Temp Source Oral  BP (!) 87/44 mmHg  MAP (mmHg) 58  BP Location Left Arm  BP Method Automatic  Patient Position (if appropriate) Lying  Pulse Rate (!) 105  Pulse Rate Source Monitor  ECG Heart Rate (!) 105  Cardiac Rhythm ST  Resp 20  Paged K schorr to notify of BP  Above and also of critical lab of gram positive cocci in clusters in aerobic bottle. Pt is alert and oriented  And ID is holding off on abx until biopsies can be done to day no new orders given will continue to monitor.  She also said to call back for map less than 55
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
## 2015-08-13 NOTE — Op Note (Signed)
Operative Note   DATE OF OPERATION: 08/13/2015  LOCATION: [facility]  SURGICAL DIVISION: Plastic Surgery  PREOPERATIVE DIAGNOSES:  Right trunk wound (10 x 10 x 2 cm)  POSTOPERATIVE DIAGNOSES:  same  PROCEDURE:  Excisional debridement of right trunk wound (10 x 10 x 2 cm) for placement of Acell (10 x 15 cm sheet and 2 gm powder) and VAC  SURGEON: Claire Sanger Dillingham, DO  ANESTHESIA:  General.   COMPLICATIONS: None.   INDICATIONS FOR PROCEDURE:  The patient, [name] [name] is a 50 y.o. male born on [birth year], is here for treatment of a right trunk wound and sacral wounds.  He has quadriplegia and has chronic wounds.  The right trunk is new. MRN: [id number]  CONSENT:  Informed consent was obtained directly from the patient. Risks, benefits and alternatives were fully discussed. Specific risks including but not limited to bleeding, infection, hematoma, seroma, scarring, pain, infection, contracture, asymmetry, wound healing problems, and need for further surgery were all discussed. The patient did have an ample opportunity to have questions answered to satisfaction.   DESCRIPTION OF PROCEDURE:  The patient was taken to the operating room. SCDs were placed.  IV antibiotics not given due to concerns with infection and biopsy needed. The patient's operative site was prepped and draped in a sterile fashion. A time out was performed and all information was confirmed to be correct.  General anesthesia was administered.  The patient was placed in the left lateral position.  The #10 blade was used to debride the area sharply. The skin, soft tissue and muscle was debrided 10 x 10 cm.  Tissue sent for cultures of soft tissue and bone/rib.  Antibiotic solution was used to debride the area.  The bleeding was controlled with electrocautery.  The Acell powder (2 gm) and sheet (10 x 15 cm) was placed and secured with 5-0 Vicryl.  The sorbact was applied and ky placed.  The vac was placed  and had an excellent seal.  The patient tolerated the procedure well.  There were no complications. The patient was allowed to wake from anesthesia, extubated and taken to the recovery room in satisfactory condition.

## 2015-08-13 NOTE — Interval H&P Note (Signed)
History and Physical Interval Note:  08/13/2015 7:18 AM  [name] [name]  has presented today for surgery, with the diagnosis of Right thorax ulcer  The various methods of treatment have been discussed with the patient and family. After consideration of risks, benefits and other options for treatment, the patient has consented to  Procedure(s): IRRIGATION AND DEBRIDEMENT WOUND (Right) as a surgical intervention .  The patient's history has been reviewed, patient examined, no change in status, stable for surgery.  I have reviewed the patient's chart and labs.  Questions were answered to the patient's satisfaction.     Wallace Going

## 2015-08-13 NOTE — OR Nursing (Signed)
Patient arrived OR 11 with left hand/wrist brace and stillette. Removed for surgery and replaced at surgery end. Patient transferred to PACU with brace and stillette.

## 2015-08-13 NOTE — Transfer of Care (Signed)
Immediate Anesthesia Transfer of Care Note  Patient: [name] [name]  Procedure(s) Performed: Procedure(s) with comments: IRRIGATION AND DEBRIDEMENT WOUND RIGHT LATERAL TORSO (Right) DRESSING CHANGE UNDER ANESTHESIA (N/A) - SACRUM  Patient Location: PACU  Anesthesia Type:General  Level of Consciousness: awake, alert  and oriented  Airway & Oxygen Therapy: Patient Spontanous Breathing and Patient connected to nasal cannula oxygen  Post-op Assessment: Report given to RN and Post -op Vital signs reviewed and stable  Post vital signs: Reviewed and stable  Last Vitals:  Filed Vitals:   08/13/15 1126 08/13/15 1519  BP: 119/89   Pulse: 94   Temp: 36.4 C 36.6 C  Resp: 24     Complications: No apparent anesthesia complications

## 2015-08-13 NOTE — Consult Note (Addendum)
WOC follow-up: Plastics team now following for assessment and plan of care and pt plans for trip to OR this am for flank and sacrum wounds, according to the EMR.   Consult performed for BLE. Pt has several chronic wounds. Left heel with dry callous .5X.5cm, no open wound or drainage; no topical treatment indicated at this time. Left posterior calf with stage 4 pressure injury; 3.5X1.5X.4cm, 85% red, 10% yellow, 5% exposed tendon. Mod amt tan drainage, slight odor.   Right posterior leg with 2 unstageable pressure injuries; 1.5X1X.2cm and 4.5X1.5X.3cm.  Both locations with 90% yellow slough, 10% red, mod amt tan drainage, slight odor.   Right heel with .4X.4cm dry callous .5X.5cm, no open wound or drainage; no topical treatment indicated at this time. Right foot with pink scar tissue to healed wound; .3X.3cm; no topical treatment indicated at this time. Santyl ointment to bilat calf wounds for enzymatic debridement of nonviable tissue.  Float heels to reduce pressure. Please refer to Dr Marla Roe for further questions regarding other wounds.  Please re-consult if further assistance is needed.  Thank-you,  Julien Girt MSN, [place], [place], [place], [place]

## 2015-08-13 NOTE — Progress Notes (Signed)
ROMA KHOSLA [id number] DOB: 1965-10-29 DOA: 08/11/2015 PCP: [name], MD  Brief narrative: 50 y/o ?   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  He has had nonhealing sacral wounds for the past 3-4 years, history of osteomyelitis, and a multiple other infections.  He has NOT been deeemd a good candidate for any type of surguical interevwention by [facility] or Dr. [name] in the past because of lack of Flap and on-going viab ility of thge same in a chronic debilitated and contractured state  Admitted to hospital from ID clinic for ? sepsis from [facility] cause Likely L rib osteo, vs Suprapubic Urine  Vs chronic lower back and Ischial decubiti  Past medical history-As per Problem list Chart reviewed as below-   Consultants:  Plastics  Procedures:  Plastic surgery oin OR 3/29  Antibiotics:  None yet   Subjective   Seen prior to surgery NPO no pain Aware of procedure No diarr   Objective    Interim History: Stoma type/location: LLQ Colostomy Ostomy pouching: 2pc., 2 and 3/4 inch pouching system with skin barrier ring  Patient's caregiver manages ostomy. Orders are provided for in-house nursing staff.    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  It is challenging to articular the anatomical areas involved as patient has had numerous plastic surgery interventions that have significantly altered landmarks.   Left heel:  Stage 3 pressure injury   Left lateral malleolus: Unstageable pressure injury   Right medial heel:  Unstageable pressure injury measuring   Right lateral LE:  Unstageable pressure injury Right lateral trunk:  Unstageable pressure injury   Telemetry: sinus   Objective: Filed Vitals:   08/13/15 1530 08/13/15 1531 08/13/15 1545 08/13/15 1637  BP:  110/80  109/75  Pulse: 87 86  85  Temp:   97.9 F (36.6 C) 97.8 F (36.6 C)  TempSrc:    Oral  Resp: 18 13  17   Height:        Weight:      SpO2: 100% 100%  100%    Intake/Output Summary (Last 24 hours) at 08/13/15 1718 Last data filed at 08/13/15 1637  Gross per 24 hour  Intake 2552.5 ml  Output   2042 ml  Net  510.5 ml    Exam:  General: alert pleasant conversant Cardiovascular: s1 s2 no m/r/g   Data Reviewed: Basic Metabolic Panel:  Recent Labs Lab 08/11/15 2222 08/12/15 0434 08/12/15 0753  NA 142 141  --   K 3.3* 3.4*  --   CL 105 109  --   CO2 24 23  --   GLUCOSE 150* 96  --   BUN 13 14  --   CREATININE 0.98 0.78  --   CALCIUM 9.2 8.4*  --   MG  --   --  1.5*   Liver Function Tests:  Recent Labs Lab 08/11/15 2222 08/12/15 0434  AST 21 19  ALT 16* 12*  ALKPHOS 84 63  BILITOT 0.3 0.5  PROT 8.1 5.9*  ALBUMIN 2.5* 2.0*   No results for input(s): LIPASE, AMYLASE in the last 168 hours. No results for input(s): AMMONIA in the last 168 hours. CBC:  Recent Labs Lab 08/11/15 2222 08/12/15 0434  WBC 23.3* 17.0*  NEUTROABS 20.1*  --   HGB 9.6* 7.4*  HCT 31.7* 24.2*  MCV 79.1 79.9  PLT 575* 485*   Cardiac Enzymes: No results for input(s): CKTOTAL, CKMB, CKMBINDEX, TROPONINI  in the last 168 hours. BNP: Invalid input(s): POCBNP CBG: No results for input(s): GLUCAP in the last 168 hours.  Recent Results (from the past 240 hour(s))  Culture, blood (Routine X 2) w Reflex to ID Panel     Status: None (Preliminary result)   Collection Time: 08/11/15 10:23 PM  Result Value Ref Range Status   Specimen Description BLOOD LEFT HAND  Final   Special Requests IN PEDIATRIC BOTTLE 1ML  Final   Culture  Setup Time   Final    GRAM POSITIVE COCCI IN CLUSTERS AEROBIC BOTTLE ONLY CRITICAL RESULT CALLED TO, READ BACK BY AND VERIFIED WITH: T IRBY,RN @0203  08/13/15 MKELLY    Culture NO GROWTH 1 DAY  Final   Report Status PENDING  Incomplete  Culture, blood (Routine X 2) w Reflex to ID Panel     Status: None (Preliminary result)   Collection Time: 08/12/15  4:15 AM  Result Value Ref  Range Status   Specimen Description BLOOD RIGHT ANTECUBITAL  Final   Special Requests IN PEDIATRIC BOTTLE 3CC  Final   Culture NO GROWTH 1 DAY  Final   Report Status PENDING  Incomplete  Urine culture     Status: None (Preliminary result)   Collection Time: 08/12/15 10:33 AM  Result Value Ref Range Status   Specimen Description URINE, SUPRAPUBIC  Final   Special Requests NONE  Final   Culture CULTURE REINCUBATED FOR BETTER GROWTH  Final   Report Status PENDING  Incomplete  MRSA PCR Screening     Status: None   Collection Time: 08/12/15  2:52 PM  Result Value Ref Range Status   MRSA by PCR NEGATIVE NEGATIVE Final    Comment:        The GeneXpert MRSA Assay (FDA approved for NASAL specimens only), is one component of a comprehensive MRSA colonization surveillance program. It is not intended to diagnose MRSA infection nor to guide or monitor treatment for MRSA infections.      Studies:              All Imaging reviewed and is as per above notation   Scheduled Meds: . sodium chloride   Intravenous Once  . antiseptic oral rinse  7 mL Mouth Rinse BID  . baclofen  20 mg Oral QID  . collagenase   Topical Daily  . darifenacin  15 mg Oral Daily  . diclofenac sodium  2 g Topical QID  . docusate sodium  200 mg Oral BID  . ferrous sulfate  325 mg Oral TID WC  . lactose free nutrition  237 mL Oral TID BM  . magnesium oxide  400 mg Oral Daily  . metoCLOPramide  10 mg Oral TID AC  . [START ON 08/14/2015] multivitamin with minerals  1 tablet Oral Daily  . nutrition supplement (JUVEN)  1 packet Oral BID BM  . pantoprazole  40 mg Oral BID AC  . [START ON 08/14/2015] pneumococcal 23 valent vaccine  0.5 mL Intramuscular Tomorrow-1000  . sucralfate  1 g Oral QID   Continuous Infusions: . sodium chloride 125 mL/hr at 08/13/15 1017     Assessment/Plan:  1. Sepsis-?Rib osteo-for tisseu sampling 3/29.  Appreciate Dr. Royce Macadamia input.  Hold abx.  Await suprapubic cult-for wounds , WOC  nurse to address dressing in conjunction with suggestions from Dr. Rolm Bookbinder you 2. Anemia-mulifactorial-nutitional + chr slow blood loss from sacral wounds-type and screen.  Transfuse per surgery 3. H/o paraplegia-difficult situation-although mentally competent-physically declining-i have broached goals of  care gently-patient is to tell us when "enough is enough"-continue suportive baclofen, monitor Ostomy etc.  Keep on SDU Reassess in am  [name], MD  [facility] Pager [phone] 08/13/2015, 5:18 PM    LOS: 1 day

## 2015-08-13 NOTE — Anesthesia Procedure Notes (Signed)
Procedure Name: Intubation Date/Time: 08/13/2015 1:51 PM Performed by: [place] Pink Pre-anesthesia Checklist: Patient identified, Emergency Drugs available, Suction available, Patient being monitored and Timeout performed Patient Re-evaluated:Patient Re-evaluated prior to inductionOxygen Delivery Method: Circle system utilized Preoxygenation: Pre-oxygenation with 100% oxygen Intubation Type: IV induction Ventilation: Two handed mask ventilation required and Oral airway inserted - appropriate to patient size Laryngoscope Size: Mac and 4 Grade View: Grade II Tube type: Oral Tube size: 7.5 mm Number of attempts: 1 Airway Equipment and Method: Stylet Placement Confirmation: ETT inserted through vocal cords under direct vision,  positive ETCO2 and breath sounds checked- equal and bilateral Secured at: 23 cm Tube secured with: Tape Dental Injury: Teeth and Oropharynx as per pre-operative assessment

## 2015-08-13 NOTE — Progress Notes (Signed)
Nurse unsuccessful at starting a new IV and obtaining type and screen.  Nurse from floorreports that lab was unsuccessful at  obtaining type and screen this morning and that MD was not aware.  Dr. Ermalene Postin notified of Hgb.

## 2015-08-13 NOTE — Anesthesia Preprocedure Evaluation (Signed)
Anesthesia Evaluation  Patient identified by MRN, date of birth, ID band Patient awake    Reviewed: Allergy & Precautions, NPO status , Patient's Chart, lab work & pertinent test results  History of Anesthesia Complications Negative for: history of anesthetic complications  Airway Mallampati: III  TM Distance: >3 FB Neck ROM: Full    Dental no notable dental hx. (+) Dental Advisory Given, Teeth Intact   Pulmonary sleep apnea and Continuous Positive Airway Pressure Ventilation ,    Pulmonary exam normal breath sounds clear to auscultation       Cardiovascular Exercise Tolerance: Poor hypertension, Pt. on medications (-) angina+ Peripheral Vascular Disease  (-) Past MI and (-) CHF Normal cardiovascular exam Rhythm:Regular Rate:Normal     Neuro/Psych Seizures -, Well Controlled,  PSYCHIATRIC DISORDERS Depression quadriplegia    GI/Hepatic Neg liver ROS, GERD  Medicated and Controlled,  Endo/Other  negative endocrine ROS  Renal/GU negative Renal ROS     Musculoskeletal   Abdominal   Peds  Hematology  (+) Blood dyscrasia, anemia ,   Anesthesia Other Findings   Reproductive/Obstetrics                             Anesthesia Physical Anesthesia Plan  ASA: III  Anesthesia Plan: General   Post-op Pain Management:    Induction: Intravenous  Airway Management Planned: Oral ETT  Additional Equipment: None  Intra-op Plan:   Post-operative Plan: Extubation in OR  Informed Consent: I have reviewed the patients History and Physical, chart, labs and discussed the procedure including the risks, benefits and alternatives for the proposed anesthesia with the patient or authorized representative who has indicated his/her understanding and acceptance.   Dental advisory given  Plan Discussed with: CRNA and Surgeon  Anesthesia Plan Comments:         Anesthesia Quick Evaluation

## 2015-08-13 NOTE — Progress Notes (Signed)
[facility]  Patient Status: Active (receiving services up to time of hospitalization)  [facility] is providing the following services: RN and MSW  If patient discharges after hours, please call [phone].   [name] [name] 08/13/2015, 3:25 PM

## 2015-08-14 ENCOUNTER — Encounter (HOSPITAL_COMMUNITY): Payer: Self-pay | Admitting: Plastic Surgery

## 2015-08-14 ENCOUNTER — Encounter: Payer: Medicare Other | Admitting: Physical Medicine & Rehabilitation

## 2015-08-14 DIAGNOSIS — A419 Sepsis, unspecified organism: Secondary | ICD-10-CM | POA: Diagnosis not present

## 2015-08-14 DIAGNOSIS — L89623 Pressure ulcer of left heel, stage 3: Secondary | ICD-10-CM | POA: Diagnosis not present

## 2015-08-14 DIAGNOSIS — L89314 Pressure ulcer of right buttock, stage 4: Secondary | ICD-10-CM | POA: Diagnosis not present

## 2015-08-14 DIAGNOSIS — L89214 Pressure ulcer of right hip, stage 4: Secondary | ICD-10-CM | POA: Diagnosis not present

## 2015-08-14 DIAGNOSIS — G8250 Quadriplegia, unspecified: Secondary | ICD-10-CM | POA: Diagnosis not present

## 2015-08-14 DIAGNOSIS — E43 Unspecified severe protein-calorie malnutrition: Secondary | ICD-10-CM | POA: Diagnosis not present

## 2015-08-14 DIAGNOSIS — E662 Morbid (severe) obesity with alveolar hypoventilation: Secondary | ICD-10-CM | POA: Diagnosis not present

## 2015-08-14 DIAGNOSIS — M25512 Pain in left shoulder: Secondary | ICD-10-CM | POA: Diagnosis not present

## 2015-08-14 DIAGNOSIS — L89154 Pressure ulcer of sacral region, stage 4: Secondary | ICD-10-CM | POA: Diagnosis not present

## 2015-08-14 DIAGNOSIS — M4620 Osteomyelitis of vertebra, site unspecified: Secondary | ICD-10-CM | POA: Diagnosis not present

## 2015-08-14 DIAGNOSIS — L89324 Pressure ulcer of left buttock, stage 4: Secondary | ICD-10-CM | POA: Diagnosis not present

## 2015-08-14 MED ORDER — COLLAGENASE 250 UNIT/GM EX OINT: TOPICAL_OINTMENT | Freq: Every day | CUTANEOUS | Status: DC

## 2015-08-14 MED ORDER — CEFTRIAXONE SODIUM 1 G IJ SOLR
1.0000 g | INTRAMUSCULAR | Status: DC
  Administered 2015-08-14: 1 g via INTRAVENOUS
  Filled 2015-08-14 (×2): qty 10

## 2015-08-14 NOTE — Progress Notes (Signed)
Paged dr Marla Roe to clarify wound vac and other wound others. New orders rec'd. [name] office also mentioned that pt will see Dr. Dellia Nims at the wound center as outpatient. Will continue to monitor.

## 2015-08-14 NOTE — Anesthesia Postprocedure Evaluation (Signed)
Anesthesia Post Note  Patient: [name] [name]  Procedure(s) Performed: Procedure(s) (LRB): IRRIGATION AND DEBRIDEMENT WOUND RIGHT LATERAL TORSO (Right) DRESSING CHANGE UNDER ANESTHESIA (N/A)  Patient location during evaluation: PACU Anesthesia Type: General Level of consciousness: awake Pain management: pain level controlled Vital Signs Assessment: post-procedure vital signs reviewed and stable Respiratory status: spontaneous breathing and respiratory function stable Cardiovascular status: stable Postop Assessment: no signs of nausea or vomiting Anesthetic complications: no    Last Vitals:  Filed Vitals:   08/14/15 0605 08/14/15 0700  BP: 112/67   Pulse: 96   Temp: 37.7 C 37.2 C  Resp: 48     Last Pain:  Filed Vitals:   08/14/15 0949  PainSc: 0-No pain                 Abeeha Twist

## 2015-08-14 NOTE — Progress Notes (Signed)
[name] [name] [id number] DOB: [birth year] DOA: 08/11/2015 PCP: [name], MD  Brief narrative:  50 y/o ?   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  He has had nonhealing sacral wounds for the past 3-4 years, history of osteomyelitis, and a multiple other infections.  He has NOT been deeemd a good candidate for any type of surguical interevwention by [facility] or Dr. [name] in the past because of lack of Flap and on-going viab ility of thge same in a chronic debilitated and contractured state  Admitted to hospital from ID clinic for ? 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 Orders are provided for in-house nursing staff.    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  It is challenging to articular the anatomical areas involved as patient has had numerous plastic surgery interventions that have significantly altered landmarks.   Left heel:  Stage 3 pressure injury   Left lateral malleolus: Unstageable pressure injury   Right medial heel:  Unstageable pressure injury measuring   Right lateral LE:  Unstageable pressure injury Right lateral trunk:  Unstageable pressure injury   Telemetry: sinus   Objective: Filed Vitals:   08/14/15 0700 08/14/15 0830 08/14/15 1114 08/14/15 1536  BP:  119/78 100/54 116/63  Pulse:  92 95 99  Temp: 98.9 F (37.2 C) 98.9 F (37.2 C) 99.2 F (37.3 C) 99.8 F (37.7 C)  TempSrc: Oral Oral Oral Oral  Resp:  19 23 18   Height:       Weight:      SpO2:  99% 100% 99%    Intake/Output Summary (Last 24 hours) at 08/14/15 1853 Last data filed at 08/14/15 1600  Gross per 24 hour  Intake   4120 ml  Output   2700 ml  Net   1420 ml    Exam:  General: alert pleasant conversant Cardiovascular: s1 s2 no m/r/g   Data Reviewed: Basic Metabolic Panel:  Recent Labs Lab 08/11/15 2222 08/12/15 0434 08/12/15 0753  NA 142 141  --   K 3.3* 3.4*  --   CL 105 109  --   CO2 24 23  --   GLUCOSE 150* 96  --   BUN 13 14  --   CREATININE 0.98 0.78  --   CALCIUM 9.2 8.4*  --   MG  --   --  1.5*   Liver Function Tests:  Recent Labs Lab 08/11/15 2222 08/12/15 0434  AST 21 19  ALT 16* 12*  ALKPHOS 84 63  BILITOT 0.3 0.5  PROT 8.1 5.9*  ALBUMIN 2.5* 2.0*   No results for input(s): LIPASE, AMYLASE in the last 168 hours. No results for input(s): AMMONIA in the last 168 hours. CBC:  Recent Labs Lab 08/11/15 2222 08/12/15 0434  WBC 23.3* 17.0*  NEUTROABS 20.1*  --   HGB 9.6* 7.4*  HCT 31.7* 24.2*  MCV 79.1 79.9  PLT 575* 485*   Cardiac Enzymes: No results for input(s): CKTOTAL, CKMB, CKMBINDEX,  TROPONINI in the last 168 hours. BNP: Invalid input(s): POCBNP CBG: No results for input(s): GLUCAP in the last 168 hours.  Recent Results (from the past 240 hour(s))  Culture, blood (Routine X 2) w Reflex to ID Panel     Status: None (Preliminary result)   Collection Time: 08/11/15 10:23 PM  Result Value Ref Range Status   Specimen Description BLOOD LEFT HAND  Final   Special Requests IN PEDIATRIC BOTTLE 1ML  Final   Culture  Setup Time   Final    GRAM POSITIVE COCCI IN CLUSTERS AEROBIC BOTTLE ONLY CRITICAL RESULT CALLED TO, READ BACK BY AND VERIFIED WITH: T IRBY,RN @0203  08/13/15 MKELLY    Culture   Final    STAPHYLOCOCCUS SPECIES (COAGULASE NEGATIVE) THE SIGNIFICANCE OF ISOLATING THIS ORGANISM FROM A SINGLE SET OF BLOOD CULTURES WHEN MULTIPLE SETS ARE DRAWN IS UNCERTAIN. PLEASE NOTIFY THE  MICROBIOLOGY DEPARTMENT WITHIN ONE WEEK IF SPECIATION AND SENSITIVITIES ARE REQUIRED.    Report Status PENDING  Incomplete  Culture, blood (Routine X 2) w Reflex to ID Panel     Status: None (Preliminary result)   Collection Time: 08/12/15  4:15 AM  Result Value Ref Range Status   Specimen Description BLOOD RIGHT ANTECUBITAL  Final   Special Requests IN PEDIATRIC BOTTLE 3CC  Final   Culture NO GROWTH 2 DAYS  Final   Report Status PENDING  Incomplete  Urine culture     Status: None (Preliminary result)   Collection Time: 08/12/15 10:33 AM  Result Value Ref Range Status   Specimen Description URINE, SUPRAPUBIC  Final   Special Requests NONE  Final   Culture   Final    >=100,000 COLONIES/mL GRAM NEGATIVE RODS IDENTIFICATION AND SUSCEPTIBILITIES TO FOLLOW    Report Status PENDING  Incomplete  MRSA PCR Screening     Status: None   Collection Time: 08/12/15  2:52 PM  Result Value Ref Range Status   MRSA by PCR NEGATIVE NEGATIVE Final    Comment:        The GeneXpert MRSA Assay (FDA approved for NASAL specimens only), is one component of a comprehensive MRSA colonization surveillance program. It is not intended to diagnose MRSA infection nor to guide or monitor treatment for MRSA infections.   Tissue culture     Status: None (Preliminary result)   Collection Time: 08/13/15  2:20 PM  Result Value Ref Range Status   Specimen Description TISSUE  Final   Special Requests RIGHT LATERAL TRUNK  Final   Gram Stain   Final    NO WBC SEEN NO ORGANISMS SEEN Performed at [facility]    Culture PENDING  Incomplete   Report Status PENDING  Incomplete  Anaerobic culture     Status: None (Preliminary result)   Collection Time: 08/13/15  2:26 PM  Result Value Ref Range Status   Specimen Description BONE  Final   Special Requests BONE FROM RIGHT RIB  Final   Gram Stain   Final    NO WBC SEEN MODERATE GRAM POSITIVE COCCI IN PAIRS Performed at [facility]    Culture  PENDING  Incomplete   Report Status PENDING  Incomplete  Tissue culture     Status: None (Preliminary result)   Collection Time: 08/13/15  2:26 PM  Result Value Ref Range Status   Specimen Description BONE  Final   Special Requests BONE FROM RIGHT RIB  Final   Gram Stain   Final    NO WBC SEEN MODERATE  GRAM POSITIVE COCCI IN PAIRS Performed at [facility]    Culture NO GROWTH Performed at [facility]   Final   Report Status PENDING  Incomplete     Studies:              All Imaging reviewed and is as per above notation   Scheduled Meds: . sodium chloride   Intravenous Once  . antiseptic oral rinse  7 mL Mouth Rinse BID  . baclofen  20 mg Oral QID  . cefTRIAXone (ROCEPHIN)  IV  1 g Intravenous Q24H  . collagenase   Topical Daily  . darifenacin  15 mg Oral Daily  . diclofenac sodium  2 g Topical QID  . docusate sodium  200 mg Oral BID  . ferrous sulfate  325 mg Oral TID WC  . lactose free nutrition  237 mL Oral TID BM  . magnesium oxide  400 mg Oral Daily  . metoCLOPramide  10 mg Oral TID AC  . multivitamin with minerals  1 tablet Oral Daily  . nutrition supplement (JUVEN)  1 packet Oral BID BM  . pantoprazole  40 mg Oral BID AC  . sucralfate  1 g Oral QID   Continuous Infusions: . sodium chloride 125 mL/hr at 08/14/15 1720     Assessment/Plan:  1. Sepsis-?Rib osteo-for tisseu sampling 3/29.  Appreciate Dr. Royce Macadamia input.  Hold abx.  Await suprapubic cult-for wounds , WOC nurse to address dressing in conjunction with suggestions from Dr. Rolm Bookbinder you 2. Pyelo with gr neg Started empiric rocephin 3/31-narrow as appropriate when able.  No indication currently for PICC as unclear duration/need abx 3. Anemia-mulifactorial-nutitional + chr slow blood loss from sacral wounds-type and screen.  Transfuse per surgery-follow am hemoglobin-Threshold<7 4. GERD-Protonix 40 bid 5. H/o paraplegia-difficult situation-although mentally competent-physically  declining-i have broached goals of care gently-patient is to tell us when "enough is enough"-continue suportive baclofen, monitor Ostomy etc.  tx to med surg Reassess in am  [name], MD  [facility] Pager [phone] 08/14/2015, 6:53 PM    LOS: 2 days

## 2015-08-14 NOTE — Progress Notes (Signed)
Pt transfer from [place], quad, room air, with wound vac on right lateral back at 125, colostomy, suprapubic, continous IVF, left arm brace, scd, multiple wounds with dry and intact dressing, alert and oriented,  Low grade temp,no s/s of resp distress noted, also on tele # 10 verified by another staff.

## 2015-08-14 NOTE — Care Management Note (Addendum)
Case Management Note  Patient Details  Name: ISSAAC CLAYWELL MRN: [id number] Date of Birth: 23-Oct-1965  Subjective/Objective:   Patient is from home with sister who takes care of him.  Patient is active with [facility], for [facility] these orders will need to be resumed at discharge, he will like to continue with [facility]- referral made to [facility] with [facility] he will need ambulance transport home when he is discharged, he will also need [facility] wound vac , which information has been faxed to MD to sign, will need script to be signed as well.  Dr. Erlinda Hong has signed forms and script , these were faxed to [facility] on 3/31.  NCM received call from rep with [facility] stating they received the referral for wound vac,  Will let NCM know on weekend to call and verify status of wound vac.                   Action/Plan:   Expected Discharge Date:                  Expected Discharge Plan:  [place]  In-House Referral:     Discharge planning Services  CM Consult  Post Acute Care Choice:  Home Health, Resumption of Svcs/PTA Provider Choice offered to:     DME Arranged:  Vac DME Agency:  [facility]  HH Arranged:  RN [place] Agency:  [facility]  Status of Service:  Completed, signed off  Medicare Important Message Given:    Date Medicare IM Given:    Medicare IM give by:    Date Additional Medicare IM Given:    Additional Medicare Important Message give by:     If discussed at [place] of Stay Meetings, dates discussed:    Additional Comments:  [name], RN 08/14/2015, 3:42 PM

## 2015-08-15 LAB — CBC WITH DIFFERENTIAL/PLATELET
BASOS PCT: 5 %
Basophils Absolute: 0.7 10*3/uL — ABNORMAL HIGH (ref 0.0–0.1)
EOS PCT: 4 %
Eosinophils Absolute: 0.6 10*3/uL (ref 0.0–0.7)
HEMATOCRIT: 23.3 % — AB (ref 39.0–52.0)
Hemoglobin: 7.1 g/dL — ABNORMAL LOW (ref 13.0–17.0)
Lymphocytes Relative: 11 %
Lymphs Abs: 1.6 10*3/uL (ref 0.7–4.0)
MCH: 24.1 pg — AB (ref 26.0–34.0)
MCHC: 30.5 g/dL (ref 30.0–36.0)
MCV: 79 fL (ref 78.0–100.0)
MONO ABS: 2.3 10*3/uL — AB (ref 0.1–1.0)
MONOS PCT: 16 %
NEUTROS PCT: 64 %
Neutro Abs: 8.9 10*3/uL — ABNORMAL HIGH (ref 1.7–7.7)
Platelets: 438 10*3/uL — ABNORMAL HIGH (ref 150–400)
RBC: 2.95 MIL/uL — AB (ref 4.22–5.81)
RDW: 16.7 % — AB (ref 11.5–15.5)
WBC: 14.1 10*3/uL — AB (ref 4.0–10.5)

## 2015-08-15 LAB — BASIC METABOLIC PANEL
ANION GAP: 8 (ref 5–15)
BUN: 8 mg/dL (ref 6–20)
CHLORIDE: 112 mmol/L — AB (ref 101–111)
CO2: 21 mmol/L — ABNORMAL LOW (ref 22–32)
Calcium: 8 mg/dL — ABNORMAL LOW (ref 8.9–10.3)
Creatinine, Ser: 0.51 mg/dL — ABNORMAL LOW (ref 0.61–1.24)
GFR calc Af Amer: >60 mL/min (ref >60)
GLUCOSE: 101 mg/dL — AB (ref 65–99)
POTASSIUM: 5.2 mmol/L — AB (ref 3.5–5.1)
Sodium: 141 mmol/L (ref 135–145)

## 2015-08-15 LAB — CULTURE, BLOOD (ROUTINE X 2)

## 2015-08-15 LAB — POTASSIUM: Potassium: 4.6 mmol/L (ref 3.5–5.1)

## 2015-08-15 MED ORDER — DEXTROSE 5 % IV SOLN
2.0000 g | Freq: Two times a day (BID) | INTRAVENOUS | Status: DC
  Administered 2015-08-15 – 2015-08-16 (×2): 2 g via INTRAVENOUS
  Filled 2015-08-15 (×5): qty 2

## 2015-08-15 MED ORDER — VANCOMYCIN HCL 10 G IV SOLR
1250.0000 mg | Freq: Two times a day (BID) | INTRAVENOUS | Status: DC
  Administered 2015-08-15 – 2015-08-16 (×2): 1250 mg via INTRAVENOUS
  Filled 2015-08-15 (×5): qty 1250

## 2015-08-15 NOTE — Progress Notes (Signed)
Pt is on cardiac monitoring but there is no order, also notified by iv team that they do not place picc lines on this pt, picc should be placed by IR. MD paged and awaiting response

## 2015-08-15 NOTE — Consult Note (Signed)
WOC wound consult note Reason for Consult: Wounds in groin and foot See note from [facility] nurse D. Barbie Haggis dated 08/13/15 with following instructions:  Patient is being followed by Plastics.  Please contact Dr. Guadlupe Spanish for future questions regarding other wounds. Patient not seen today.  Bedside RN contacted and instructed to call Plastics for these orders. [place] nursing team will not follow, but will remain available to this patient, the nursing and medical teams.  Please re-consult if needed. Thanks, Maudie Flakes, MSN, RN, [place], Arther Abbott  Pager# [phone]

## 2015-08-15 NOTE — Progress Notes (Signed)
[name] [name] [id number] DOB: 05/21/1965 DOA: 08/11/2015 PCP: [name], MD  Brief narrative:  50 y/o ?   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  He has had nonhealing sacral wounds for the past 3-4 years, history of osteomyelitis, and a multiple other infections.  He has NOT been deeemd a good candidate for any type of surguical interevwention by [facility] or Dr. [name] in the past because of lack of Flap and on-going viab ility of thge same in a chronic debilitated and contractured state  Admitted to hospital from ID clinic for ? 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   Past medical history-As per Problem list Chart reviewed as below-   Consultants:  Plastics  Procedures:  Plastic surgery oin OR 3/29  Antibiotics:  None yet   Subjective   Well no issues Has some pain but otherwise is fine Number    Objective    Interim History: Stoma type/location: LLQ Colostomy Ostomy pouching: 2pc., 2 and 3/4 inch pouching system with skin barrier ring  Patient's caregiver manages ostomy. Orders are provided for in-house nursing staff.    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  It is challenging to articular the anatomical areas involved as patient has had numerous plastic surgery interventions that have significantly altered landmarks.   Left heel:  Stage 3 pressure injury   Left lateral malleolus: Unstageable pressure injury   Right medial heel:  Unstageable pressure injury measuring   Right lateral LE:  Unstageable pressure injury Right lateral trunk:  Unstageable pressure injury   Telemetry: sinus   Objective: Filed Vitals:   08/14/15 1536 08/14/15 2052 08/15/15 0543  08/15/15 1402  BP: 116/63 124/68 123/73 131/82  Pulse: 99 108 80 89  Temp: 99.8 F (37.7 C) 102 F (38.9 C) 98.3 F (36.8 C) 98.6 F (37 C)  TempSrc: Oral Oral Oral Oral  Resp: 18 24 24 22   Height:  6' (1.829 m)    Weight:      SpO2: 99% 98% 100% 100%    Intake/Output Summary (Last 24 hours) at 08/15/15 1746 Last data filed at 08/15/15 1403  Gross per 24 hour  Intake 4174.83 ml  Output   4200 ml  Net -25.17 ml    Exam:  General: alert pleasant conversant Cardiovascular: s1 s2 no m/r/g       Data Reviewed: Basic Metabolic Panel:  Recent Labs Lab 08/11/15 2222 08/12/15 0434 08/12/15 0753 08/15/15 0615 08/15/15 0900  NA 142 141  --  141  --   K 3.3* 3.4*  --  5.2* 4.6  CL 105 109  --  112*  --   CO2 24 23  --  21*  --   GLUCOSE 150* 96  --  101*  --   BUN 13 14  --  8  --   CREATININE 0.98 0.78  --  0.51*  --   CALCIUM 9.2 8.4*  --  8.0*  --   MG  --   --  1.5*  --   --    Liver Function Tests:  Recent Labs Lab 08/11/15 2222 08/12/15 0434  AST 21 19  ALT 16* 12*  ALKPHOS 84 63  BILITOT 0.3 0.5  PROT  8.1 5.9*  ALBUMIN 2.5* 2.0*   No results for input(s): LIPASE, AMYLASE in the last 168 hours. No results for input(s): AMMONIA in the last 168 hours. CBC:  Recent Labs Lab 08/11/15 2222 08/12/15 0434 08/15/15 0615  WBC 23.3* 17.0* 14.1*  NEUTROABS 20.1*  --  8.9*  HGB 9.6* 7.4* 7.1*  HCT 31.7* 24.2* 23.3*  MCV 79.1 79.9 79.0  PLT 575* 485* 438*   Cardiac Enzymes: No results for input(s): CKTOTAL, CKMB, CKMBINDEX, TROPONINI in the last 168 hours. BNP: Invalid input(s): POCBNP CBG: No results for input(s): GLUCAP in the last 168 hours.  Recent Results (from the past 240 hour(s))  Culture, blood (Routine X 2) w Reflex to ID Panel     Status: None   Collection Time: 08/11/15 10:23 PM  Result Value Ref Range Status   Specimen Description BLOOD LEFT HAND  Final   Special Requests IN PEDIATRIC BOTTLE 1ML  Final   Culture  Setup Time    Final    GRAM POSITIVE COCCI IN CLUSTERS AEROBIC BOTTLE ONLY CRITICAL RESULT CALLED TO, READ BACK BY AND VERIFIED WITH: T IRBY,RN @0203  08/13/15 MKELLY    Culture   Final    STAPHYLOCOCCUS SPECIES (COAGULASE NEGATIVE) THE SIGNIFICANCE OF ISOLATING THIS ORGANISM FROM A SINGLE SET OF BLOOD CULTURES WHEN MULTIPLE SETS ARE DRAWN IS UNCERTAIN. PLEASE NOTIFY THE MICROBIOLOGY DEPARTMENT WITHIN ONE WEEK IF SPECIATION AND SENSITIVITIES ARE REQUIRED.    Report Status 08/15/2015 FINAL  Final  Culture, blood (Routine X 2) w Reflex to ID Panel     Status: None (Preliminary result)   Collection Time: 08/12/15  4:15 AM  Result Value Ref Range Status   Specimen Description BLOOD RIGHT ANTECUBITAL  Final   Special Requests IN PEDIATRIC BOTTLE 3CC  Final   Culture NO GROWTH 3 DAYS  Final   Report Status PENDING  Incomplete  Urine culture     Status: None (Preliminary result)   Collection Time: 08/12/15 10:33 AM  Result Value Ref Range Status   Specimen Description URINE, SUPRAPUBIC  Final   Special Requests NONE  Final   Culture   Final    60,000 COLONIES/ml ESCHERICHIA COLI 20,000 COLONIES/mL PROVIDENCIA STUARTII 20,000 COLONIES/mL PSEUDOMONAS AERUGINOSA SUSCEPTIBILITIES TO FOLLOW 20,000 COLONIES/mL GRAM NEGATIVE RODS 30,000 COLONIES/mL PROTEUS MIRABILIS    Report Status PENDING  Incomplete   Organism ID, Bacteria ESCHERICHIA COLI  Final   Organism ID, Bacteria PROVIDENCIA STUARTII  Final   Organism ID, Bacteria PROTEUS MIRABILIS  Final      Susceptibility   Escherichia coli - MIC*    AMPICILLIN <=2 SENSITIVE Sensitive     CEFAZOLIN <=4 SENSITIVE Sensitive     CEFTRIAXONE <=1 SENSITIVE Sensitive     CIPROFLOXACIN >=4 RESISTANT Resistant     GENTAMICIN <=1 SENSITIVE Sensitive     IMIPENEM <=0.25 SENSITIVE Sensitive     NITROFURANTOIN <=16 SENSITIVE Sensitive     TRIMETH/SULFA <=20 SENSITIVE Sensitive     AMPICILLIN/SULBACTAM <=2 SENSITIVE Sensitive     PIP/TAZO <=4 SENSITIVE Sensitive      * 60,000 COLONIES/ml ESCHERICHIA COLI   Proteus mirabilis - MIC*    AMPICILLIN <=2 SENSITIVE Sensitive     CEFAZOLIN <=4 SENSITIVE Sensitive     CEFTRIAXONE <=1 SENSITIVE Sensitive     CIPROFLOXACIN <=0.25 SENSITIVE Sensitive     GENTAMICIN <=1 SENSITIVE Sensitive     IMIPENEM 4 SENSITIVE Sensitive     NITROFURANTOIN 128 RESISTANT Resistant     TRIMETH/SULFA <=20 SENSITIVE Sensitive  AMPICILLIN/SULBACTAM <=2 SENSITIVE Sensitive     PIP/TAZO <=4 SENSITIVE Sensitive     * 30,000 COLONIES/mL PROTEUS MIRABILIS   Providencia stuartii - MIC*    AMPICILLIN >=32 RESISTANT Resistant     CEFAZOLIN >=64 RESISTANT Resistant     CEFTRIAXONE <=1 SENSITIVE Sensitive     CIPROFLOXACIN >=4 RESISTANT Resistant     GENTAMICIN >=16 RESISTANT Resistant     IMIPENEM 2 SENSITIVE Sensitive     NITROFURANTOIN 128 RESISTANT Resistant     TRIMETH/SULFA >=320 RESISTANT Resistant     AMPICILLIN/SULBACTAM >=32 RESISTANT Resistant     PIP/TAZO <=4 SENSITIVE Sensitive     * 20,000 COLONIES/mL PROVIDENCIA STUARTII  MRSA PCR Screening     Status: None   Collection Time: 08/12/15  2:52 PM  Result Value Ref Range Status   MRSA by PCR NEGATIVE NEGATIVE Final    Comment:        The GeneXpert MRSA Assay (FDA approved for NASAL specimens only), is one component of a comprehensive MRSA colonization surveillance program. It is not intended to diagnose MRSA infection nor to guide or monitor treatment for MRSA infections.   Anaerobic culture     Status: None (Preliminary result)   Collection Time: 08/13/15  2:20 PM  Result Value Ref Range Status   Specimen Description TISSUE  Final   Special Requests RIGHT LATERAL TRUNK  Final   Gram Stain PENDING  Incomplete   Culture   Final    NO ANAEROBES ISOLATED; CULTURE IN PROGRESS FOR 5 DAYS Performed at [facility]    Report Status PENDING  Incomplete  Tissue culture     Status: None (Preliminary result)   Collection Time: 08/13/15  2:20 PM  Result  Value Ref Range Status   Specimen Description TISSUE  Final   Special Requests RIGHT LATERAL TRUNK  Final   Gram Stain   Final    NO WBC SEEN NO ORGANISMS SEEN Performed at [facility]    Culture   Final    MODERATE GRAM NEGATIVE RODS Performed at [facility]    Report Status PENDING  Incomplete  Anaerobic culture     Status: None (Preliminary result)   Collection Time: 08/13/15  2:26 PM  Result Value Ref Range Status   Specimen Description BONE  Final   Special Requests BONE FROM RIGHT RIB  Final   Gram Stain   Final    NO WBC SEEN MODERATE GRAM POSITIVE COCCI IN PAIRS Performed at [facility]    Culture   Final    NO ANAEROBES ISOLATED; CULTURE IN PROGRESS FOR 5 DAYS Performed at [facility]    Report Status PENDING  Incomplete  Tissue culture     Status: None (Preliminary result)   Collection Time: 08/13/15  2:26 PM  Result Value Ref Range Status   Specimen Description BONE  Final   Special Requests BONE FROM RIGHT RIB  Final   Gram Stain   Final    NO WBC SEEN MODERATE GRAM POSITIVE COCCI IN PAIRS Performed at [facility]    Culture   Final    Culture reincubated for better growth Performed at [facility]    Report Status PENDING  Incomplete     Studies:              All Imaging reviewed and is as per above notation   Scheduled Meds: . sodium chloride   Intravenous Once  . antiseptic oral rinse  7 mL Mouth Rinse BID  . baclofen  20 mg Oral QID  . ceFEPime (MAXIPIME) IV  2 g Intravenous Q12H  . collagenase   Topical Daily  . darifenacin  15 mg Oral Daily  . diclofenac sodium  2 g Topical QID  . docusate sodium  200 mg Oral BID  . ferrous sulfate  325 mg Oral TID WC  . lactose free nutrition  237 mL Oral TID BM  . magnesium oxide  400 mg Oral Daily  . metoCLOPramide  10 mg Oral TID AC  . multivitamin with minerals  1 tablet Oral Daily  . nutrition supplement (JUVEN)  1 packet Oral BID BM  .  pantoprazole  40 mg Oral BID AC  . sucralfate  1 g Oral QID  . vancomycin  1,250 mg Intravenous Q12H   Continuous Infusions: . sodium chloride 125 mL/hr at 08/15/15 0543     Assessment/Plan:  1. Sepsis-?Rib osteo-for tisseu sampling 3/29.  Appreciate Dr. Royce Macadamia input.  Hold abx.  Await suprapubic cult-for wounds , WOC nurse to address dressing in conjunction with suggestions from Dr. Rolm Bookbinder you--setting of wound VAC should be 125 per plastic surgeon. Culture from right rib is pending-wounds reexamined today/1/17 see pictures above. I will contact non-emergently Dr. Lucianne Lei dam of ID who will help [place] determine duration and course of antibiotics which I suspect will be at least 6 weeks if not longer-patient may need suppressive therapy given multiple areas and sources for potential infection-wound culture grew moderate gram-positive cocci in pairs as well as moderate gram-negative rods so transitioned to antibiotics with cefepime and vancomycin 05/17/15 2. Pyelo with gr neg-mpiric rocephin 3/31-see above discussion  No indication currently for PICC as unclear duration/need abx 3. Anemia-mulifactorial-nutitional + chr slow blood loss from sacral wounds-type and screen.  Transfuse per surgery-follow am hemoglobin-Threshold<7 4. GERD-Protonix 40 bid 5. H/o paraplegia-difficult situation-although mentally competent-physically declining-i have broached goals of care gently-patient is to tell us when "enough is enough"-continue suportive baclofen, monitor Ostomy etc.  tx to med surg  Hemodynamically stable-is in need of a tunneled central access per interventional radiology as he cannot get a PICC line this will probably be performed tomorrow/2 I will let infectious disease no about his care so they can comment on antibiotic duration Reassess in am  [name], MD  [facility] Pager [phone] 08/15/2015, 5:46 PM    LOS: 3 days

## 2015-08-15 NOTE — Progress Notes (Signed)
Pharmacy Antibiotic Note  [name] [name] is a 50 y.o. male admitted on 08/11/2015 with rib osteomyelitis and UTI in setting of suprapubic catheter.  Pharmacy has been consulted for vancomycin and cefepime dosing.  PMH significant for quadriplegia, wound VAC on R lateral back, colostomy, suprapubic catheter, multiple non-healing sacral wounds, hx osteo, chronic debilitated and contracted state. R rib bone culture w/ GP cocci in pairs.  Suprapubic Ucx with < 100 k of 4 different bugs - likely colonized.  WBC 23.3>17>14.1, Creat 0.51, Tmax 102 In past admissions: 05/31/15 VT was 25 on 1 gm q8 and 06/10/15 VT 16 on 1250 q12h.   Plan: cefepime 2 gm IV q12 Vancomycin 1250 mg IV q12 F/u renal fxn, wbc, temp, culture data VT soon 2nd to quadriplegia  Height: 6' (182.9 cm) Weight: 203 lb (92.08 kg) IBW/kg (Calculated) : 77.6  Temp (24hrs), Avg:99.7 F (37.6 C), Min:98.3 F (36.8 C), Max:102 F (38.9 C)   Recent Labs Lab 08/11/15 2222 08/11/15 2230 08/12/15 0039 08/12/15 0433 08/12/15 0434 08/12/15 0755 08/15/15 0615  WBC 23.3*  --   --   --  17.0*  --  14.1*  CREATININE 0.98  --   --   --  0.78  --  0.51*  LATICACIDVEN  --  2.32* 2.58* 0.9  --  1.2  --     Estimated Creatinine Clearance: 122.6 mL/min (by C-G formula based on Cr of 0.51).    Allergies  Allergen Reactions  . Ditropan [Oxybutynin] Other (See Comments)    hallucinations    Antimicrobials this admission: CTX 3/31>>4/1 Vanc 3/30 x1  4/1>> Cefepime 4/1>> Zosyn 3/30 X1  Dose adjustments this admission: None  Microbiology results: Cx- blood- - CONS 1/2, urine GNR > 100K 3/30 R Rib bone>> mod GP cocci  in pairs 3/30 Tissue R Rib>>mod GPCocci in pairs 3/30 R lateral trunk tissue: mod GNRs  3/29 suprapubic urine>> 50 k e COLI, sens all x cipro 20 k PROVIDENCIA STUARTII  - sens CTX, imi, zosyn 20 k PSEUDOMONAS AERUG 20 k GNR 30 K PROTEUS MIRABILIS sens all x nitrof  3/29 BC 1 of 2 CNS  Thank you for allowing  pharmacy to be a part of this patient's care.  Eudelia Bunch, Pharm.D. BP:[phone] 08/15/2015 2:58 PM

## 2015-08-16 DIAGNOSIS — R7881 Bacteremia: Secondary | ICD-10-CM | POA: Diagnosis present

## 2015-08-16 DIAGNOSIS — G8250 Quadriplegia, unspecified: Secondary | ICD-10-CM

## 2015-08-16 DIAGNOSIS — M8668 Other chronic osteomyelitis, other site: Secondary | ICD-10-CM

## 2015-08-16 DIAGNOSIS — B9689 Other specified bacterial agents as the cause of diseases classified elsewhere: Secondary | ICD-10-CM

## 2015-08-16 DIAGNOSIS — M4624 Osteomyelitis of vertebra, thoracic region: Secondary | ICD-10-CM

## 2015-08-16 LAB — URINE CULTURE: Culture: 60000

## 2015-08-16 LAB — TISSUE CULTURE: Gram Stain: NONE SEEN

## 2015-08-16 LAB — CBC
HEMATOCRIT: 22.2 % — AB (ref 39.0–52.0)
Hemoglobin: 6.9 g/dL — CL (ref 13.0–17.0)
MCH: 24.6 pg — ABNORMAL LOW (ref 26.0–34.0)
MCHC: 31.1 g/dL (ref 30.0–36.0)
MCV: 79 fL (ref 78.0–100.0)
Platelets: 435 10*3/uL — ABNORMAL HIGH (ref 150–400)
RBC: 2.81 MIL/uL — ABNORMAL LOW (ref 4.22–5.81)
RDW: 16.4 % — AB (ref 11.5–15.5)
WBC: 13 10*3/uL — ABNORMAL HIGH (ref 4.0–10.5)

## 2015-08-16 LAB — TYPE AND SCREEN
ABO/RH(D): B POS
ANTIBODY SCREEN: NEGATIVE

## 2015-08-16 MED ORDER — SODIUM CHLORIDE 0.9 % IV SOLN
Freq: Once | INTRAVENOUS | Status: AC
  Administered 2015-08-16: 19:00:00 via INTRAVENOUS

## 2015-08-16 MED ORDER — DIPHENHYDRAMINE HCL 25 MG PO CAPS
25.0000 mg | ORAL_CAPSULE | Freq: Once | ORAL | Status: AC
  Administered 2015-08-16: 25 mg via ORAL
  Filled 2015-08-16: qty 1

## 2015-08-16 MED ORDER — CEFTRIAXONE SODIUM 2 G IJ SOLR
2.0000 g | INTRAMUSCULAR | Status: DC
  Administered 2015-08-16: 2 g via INTRAVENOUS
  Filled 2015-08-16 (×2): qty 2

## 2015-08-16 MED ORDER — FUROSEMIDE 10 MG/ML IJ SOLN
20.0000 mg | Freq: Once | INTRAMUSCULAR | Status: AC
  Administered 2015-08-16: 20 mg via INTRAVENOUS
  Filled 2015-08-16: qty 2

## 2015-08-16 NOTE — Progress Notes (Signed)
Wound vac has been approved, will be delivered to [id number] in 4hrs from 10:12 am today, per [employer] rep.  [employer] will contact [name] [name]  For a more definite ETA of wound vac.
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
## 2015-08-16 NOTE — Progress Notes (Signed)
Microbiology lab called with results from suprapubic urine culture which is pseudomonas growing drug resistant.

## 2015-08-16 NOTE — Progress Notes (Signed)
CRITICAL VALUE ALERT  Critical value received:  Hbg 6.9  Date of notification:  08/16/15  Time of notification:  [id number]  Critical value read back:Yes.    Nurse who received alert:  Precious Bard, RN  MD notified (1st page):  Nada Libman  Time of first page:   MD notified (2nd page):  Time of second page:  Responding MD:  Nada Libman  Time MD responded:

## 2015-08-17 ENCOUNTER — Inpatient Hospital Stay (HOSPITAL_COMMUNITY): Payer: Medicare Other

## 2015-08-17 ENCOUNTER — Encounter (HOSPITAL_COMMUNITY): Payer: Self-pay | Admitting: Radiology

## 2015-08-17 ENCOUNTER — Encounter (HOSPITAL_BASED_OUTPATIENT_CLINIC_OR_DEPARTMENT_OTHER): Payer: Medicare Other | Attending: Internal Medicine

## 2015-08-17 DIAGNOSIS — B965 Pseudomonas (aeruginosa) (mallei) (pseudomallei) as the cause of diseases classified elsewhere: Secondary | ICD-10-CM

## 2015-08-17 DIAGNOSIS — G8250 Quadriplegia, unspecified: Secondary | ICD-10-CM | POA: Insufficient documentation

## 2015-08-17 DIAGNOSIS — L89324 Pressure ulcer of left buttock, stage 4: Secondary | ICD-10-CM | POA: Insufficient documentation

## 2015-08-17 DIAGNOSIS — I10 Essential (primary) hypertension: Secondary | ICD-10-CM | POA: Insufficient documentation

## 2015-08-17 DIAGNOSIS — G4730 Sleep apnea, unspecified: Secondary | ICD-10-CM | POA: Insufficient documentation

## 2015-08-17 DIAGNOSIS — R112 Nausea with vomiting, unspecified: Secondary | ICD-10-CM | POA: Diagnosis present

## 2015-08-17 DIAGNOSIS — M4624 Osteomyelitis of vertebra, thoracic region: Secondary | ICD-10-CM | POA: Diagnosis present

## 2015-08-17 DIAGNOSIS — L97819 Non-pressure chronic ulcer of other part of right lower leg with unspecified severity: Secondary | ICD-10-CM | POA: Insufficient documentation

## 2015-08-17 DIAGNOSIS — M869 Osteomyelitis, unspecified: Secondary | ICD-10-CM | POA: Insufficient documentation

## 2015-08-17 DIAGNOSIS — A498 Other bacterial infections of unspecified site: Secondary | ICD-10-CM | POA: Diagnosis present

## 2015-08-17 DIAGNOSIS — L89154 Pressure ulcer of sacral region, stage 4: Secondary | ICD-10-CM | POA: Insufficient documentation

## 2015-08-17 DIAGNOSIS — K3189 Other diseases of stomach and duodenum: Secondary | ICD-10-CM

## 2015-08-17 DIAGNOSIS — L89620 Pressure ulcer of left heel, unstageable: Secondary | ICD-10-CM | POA: Insufficient documentation

## 2015-08-17 DIAGNOSIS — L89894 Pressure ulcer of other site, stage 4: Secondary | ICD-10-CM | POA: Insufficient documentation

## 2015-08-17 DIAGNOSIS — S31000A Unspecified open wound of lower back and pelvis without penetration into retroperitoneum, initial encounter: Secondary | ICD-10-CM | POA: Diagnosis present

## 2015-08-17 DIAGNOSIS — IMO0002 Reserved for concepts with insufficient information to code with codable children: Secondary | ICD-10-CM | POA: Diagnosis present

## 2015-08-17 LAB — TYPE AND SCREEN
ABO/RH(D): B POS
Antibody Screen: NEGATIVE
UNIT DIVISION: 0
Unit division: 0

## 2015-08-17 LAB — COMPREHENSIVE METABOLIC PANEL
ALBUMIN: 1.7 g/dL — AB (ref 3.5–5.0)
ALT: 14 U/L — ABNORMAL LOW (ref 17–63)
AST: 13 U/L — AB (ref 15–41)
Alkaline Phosphatase: 70 U/L (ref 38–126)
Anion gap: 10 (ref 5–15)
BILIRUBIN TOTAL: 0.5 mg/dL (ref 0.3–1.2)
BUN: 7 mg/dL (ref 6–20)
CO2: 26 mmol/L (ref 22–32)
Calcium: 8.1 mg/dL — ABNORMAL LOW (ref 8.9–10.3)
Chloride: 106 mmol/L (ref 101–111)
Creatinine, Ser: 0.4 mg/dL — ABNORMAL LOW (ref 0.61–1.24)
GFR calc Af Amer: >60 mL/min (ref >60)
GFR calc non Af Amer: >60 mL/min (ref >60)
GLUCOSE: 112 mg/dL — AB (ref 65–99)
POTASSIUM: 2.6 mmol/L — AB (ref 3.5–5.1)
Sodium: 142 mmol/L (ref 135–145)
Total Protein: 5.8 g/dL — ABNORMAL LOW (ref 6.5–8.1)

## 2015-08-17 LAB — CULTURE, BLOOD (ROUTINE X 2): CULTURE: NO GROWTH

## 2015-08-17 MED ORDER — IOPAMIDOL (ISOVUE-300) INJECTION 61%
INTRAVENOUS | Status: AC
  Administered 2015-08-18: 100 mL
  Filled 2015-08-17: qty 100

## 2015-08-17 MED ORDER — POTASSIUM CHLORIDE 10 MEQ/100ML IV SOLN
10.0000 meq | INTRAVENOUS | Status: AC
  Administered 2015-08-17 – 2015-08-18 (×5): 10 meq via INTRAVENOUS
  Filled 2015-08-17 (×5): qty 100

## 2015-08-17 MED ORDER — ONDANSETRON HCL 4 MG/2ML IJ SOLN
INTRAMUSCULAR | Status: AC
  Filled 2015-08-17: qty 2

## 2015-08-17 MED ORDER — LIDOCAINE HCL 1 % IJ SOLN
INTRAMUSCULAR | Status: AC
  Filled 2015-08-17: qty 20

## 2015-08-17 MED ORDER — PROCHLORPERAZINE EDISYLATE 5 MG/ML IJ SOLN
5.0000 mg | Freq: Four times a day (QID) | INTRAMUSCULAR | Status: DC | PRN
  Administered 2015-08-17 (×2): 5 mg via INTRAVENOUS
  Filled 2015-08-17 (×4): qty 1

## 2015-08-17 MED ORDER — DEXTROSE 5 % IV SOLN
2.0000 g | Freq: Three times a day (TID) | INTRAVENOUS | Status: DC
  Administered 2015-08-17 – 2015-08-25 (×25): 2 g via INTRAVENOUS
  Filled 2015-08-17 (×29): qty 2

## 2015-08-17 MED ORDER — SODIUM CHLORIDE 0.9% FLUSH
10.0000 mL | INTRAVENOUS | Status: DC | PRN
  Administered 2015-08-17: 20 mL
  Administered 2015-08-18: 10 mL
  Administered 2015-08-20: 20 mL
  Administered 2015-08-22 – 2015-08-25 (×3): 10 mL
  Filled 2015-08-17 (×6): qty 40

## 2015-08-17 MED ORDER — IOHEXOL 300 MG/ML  SOLN
25.0000 mL | INTRAMUSCULAR | Status: AC
  Administered 2015-08-17 (×2): 25 mL via ORAL

## 2015-08-17 MED ORDER — DEXTROSE-NACL 5-0.9 % IV SOLN
INTRAVENOUS | Status: DC
  Administered 2015-08-17 – 2015-08-19 (×3): via INTRAVENOUS
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
## 2015-08-17 NOTE — Progress Notes (Signed)
CRITICAL VALUE ALERT  Critical value received: K+ 2.6  Date of notification:  08/17/2015  Time of notification:  9:00pm  Critical value read back:yes  Nurse who received alert: Verdis Frederickson  MD notified (1st page):  Raliegh Ip. Schorr  Time of first page:  9:16  MD notified (2nd page):  Time of second page:  Responding MD:   Time MD responded:
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
## 2015-08-17 NOTE — Progress Notes (Signed)
Subjective: [name] [name] is complaining of abdominal distention and worried that he has an ileus Antibiotics:  Anti-infectives    Start     Dose/Rate Route Frequency Ordered Stop   08/17/15 1400  ceFEPIme (MAXIPIME) 2 g in dextrose 5 % 50 mL IVPB     2 g 100 mL/hr over 30 Minutes Intravenous 3 times per day 08/17/15 1053     08/16/15 2000  cefTRIAXone (ROCEPHIN) 2 g in dextrose 5 % 50 mL IVPB  Status:  Discontinued     2 g 100 mL/hr over 30 Minutes Intravenous Every 24 hours 08/16/15 1846 08/17/15 1052   08/15/15 1800  ceFEPIme (MAXIPIME) 2 g in dextrose 5 % 50 mL IVPB  Status:  Discontinued     2 g 100 mL/hr over 30 Minutes Intravenous Every 12 hours 08/15/15 1443 08/16/15 1846   08/15/15 1800  vancomycin (VANCOCIN) 1,250 mg in sodium chloride 0.9 % 250 mL IVPB  Status:  Discontinued     1,250 mg 166.7 mL/hr over 90 Minutes Intravenous Every 12 hours 08/15/15 1449 08/16/15 1846   08/14/15 1500  cefTRIAXone (ROCEPHIN) 1 g in dextrose 5 % 50 mL IVPB  Status:  Discontinued     1 g 100 mL/hr over 30 Minutes Intravenous Every 24 hours 08/14/15 1339 08/15/15 1438   08/13/15 1451  polymyxin B 500,000 Units, bacitracin 50,000 Units in sodium chloride irrigation 0.9 % 500 mL irrigation  Status:  Discontinued       As needed 08/13/15 1452 08/13/15 1512   08/13/15 1345  vancomycin (VANCOCIN) IVPB 1000 mg/200 mL premix     1,000 mg 200 mL/hr over 60 Minutes Intravenous To Surgery 08/13/15 1334 08/13/15 1500   08/13/15 1345  piperacillin-tazobactam (ZOSYN) IVPB 3.375 g  Status:  Discontinued     3.375 g 12.5 mL/hr over 240 Minutes Intravenous To Surgery 08/13/15 1334 08/13/15 1639   08/11/15 2145  piperacillin-tazobactam (ZOSYN) IVPB 3.375 g  Status:  Discontinued     3.375 g 100 mL/hr over 30 Minutes Intravenous  Once 08/11/15 2136 08/12/15 0020   08/11/15 2145  vancomycin (VANCOCIN) IVPB 1000 mg/200 mL premix  Status:  Discontinued     1,000 mg 200 mL/hr over 60 Minutes Intravenous   Once 08/11/15 2136 08/12/15 0020      Medications: Scheduled Meds: . sodium chloride   Intravenous Once  . antiseptic oral rinse  7 mL Mouth Rinse BID  . baclofen  20 mg Oral QID  . ceFEPime (MAXIPIME) IV  2 g Intravenous 3 times per day  . collagenase   Topical Daily  . darifenacin  15 mg Oral Daily  . diclofenac sodium  2 g Topical QID  . ferrous sulfate  325 mg Oral TID WC  . lactose free nutrition  237 mL Oral TID BM  . lidocaine      . magnesium oxide  400 mg Oral Daily  . metoCLOPramide  10 mg Oral TID AC  . multivitamin with minerals  1 tablet Oral Daily  . nutrition supplement (JUVEN)  1 packet Oral BID BM  . ondansetron      . pantoprazole  40 mg Oral BID AC  . sucralfate  1 g Oral QID   Continuous Infusions: . dextrose 5 % and 0.9% NaCl     PRN Meds:.acetaminophen **OR** acetaminophen, HYDROcodone-acetaminophen, ondansetron **OR** ondansetron (ZOFRAN) IV, prochlorperazine, senna-docusate    Objective: Weight change:   Intake/Output Summary (Last 24 hours) at 08/17/15 1824  Last data filed at 08/17/15 1749  Gross per 24 hour  Intake 2360.42 ml  Output   4550 ml  Net -2189.58 ml   Blood pressure 116/66, pulse 79, temperature 98.8 F (37.1 C), temperature source Oral, resp. rate 22, height 6' (1.829 m), weight 203 lb (92.08 kg), SpO2 98 %. Temp:  [98.4 F (36.9 C)-99.9 F (37.7 C)] 98.8 F (37.1 C) (04/03 1551) Pulse Rate:  [79-95] 79 (04/03 1551) Resp:  [18-22] 22 (04/03 1551) BP: (85-145)/(47-89) 116/66 mmHg (04/03 1551) SpO2:  [98 %-100 %] 98 % (04/03 1551)  Physical Exam: General: Alert and awake, oriented x3, not in any acute distress. HEENT: anicteric sclera, pupils reactive to light and accommodation, EOMI CVS regular rate, normal r,  no murmur rubs or gallops Chest: clear to auscultation bilaterally, no wheezing, rales or rhonchi Abdomen: distended and tender diffusely, MSK: wounds not examined Skin: no rashes  Neuro:  quadraplegic  CBC: . CBC Latest Ref Rng 08/16/2015 08/15/2015 08/12/2015  WBC 4.0 - 10.5 K/uL 13.0(H) 14.1(H) 17.0(H)  Hemoglobin 13.0 - 17.0 g/dL 6.9(LL) 7.1(L) 7.4(L)  Hematocrit 39.0 - 52.0 % 22.2(L) 23.3(L) 24.2(L)  Platelets 150 - 400 K/uL 435(H) 438(H) 485(H)       BMET  Recent Labs  08/15/15 0615 08/15/15 0900  NA 141  --   K 5.2* 4.6  CL 112*  --   CO2 21*  --   GLUCOSE 101*  --   BUN 8  --   CREATININE 0.51*  --   CALCIUM 8.0*  --      Liver Panel  No results for input(s): PROT, ALBUMIN, AST, ALT, ALKPHOS, BILITOT, BILIDIR, IBILI in the last 72 hours.     Sedimentation Rate No results for input(s): ESRSEDRATE in the last 72 hours. C-Reactive Protein No results for input(s): CRP in the last 72 hours.  Micro Results: Recent Results (from the past 720 hour(s))  Culture, blood (Routine X 2) w Reflex to ID Panel     Status: None   Collection Time: 08/11/15 10:23 PM  Result Value Ref Range Status   Specimen Description BLOOD LEFT HAND  Final   Special Requests IN PEDIATRIC BOTTLE 1ML  Final   Culture  Setup Time   Final    GRAM POSITIVE COCCI IN CLUSTERS AEROBIC BOTTLE ONLY CRITICAL RESULT CALLED TO, READ BACK BY AND VERIFIED WITH: T IRBY,RN @0203  08/13/15 MKELLY    Culture   Final    STAPHYLOCOCCUS SPECIES (COAGULASE NEGATIVE) THE SIGNIFICANCE OF ISOLATING THIS ORGANISM FROM A SINGLE SET OF BLOOD CULTURES WHEN MULTIPLE SETS ARE DRAWN IS UNCERTAIN. PLEASE NOTIFY THE MICROBIOLOGY DEPARTMENT WITHIN ONE WEEK IF SPECIATION AND SENSITIVITIES ARE REQUIRED.    Report Status 08/15/2015 FINAL  Final  Culture, blood (Routine X 2) w Reflex to ID Panel     Status: None   Collection Time: 08/12/15  4:15 AM  Result Value Ref Range Status   Specimen Description BLOOD RIGHT ANTECUBITAL  Final   Special Requests IN PEDIATRIC BOTTLE 3CC  Final   Culture NO GROWTH 5 DAYS  Final   Report Status 08/17/2015 FINAL  Final  Urine culture     Status: None   Collection Time:  08/12/15 10:33 AM  Result Value Ref Range Status   Specimen Description URINE, SUPRAPUBIC  Final   Special Requests NONE  Final   Culture   Final    60,000 COLONIES/ml ESCHERICHIA COLI 20,000 COLONIES/mL PROVIDENCIA STUARTII 20,000 COLONIES/mL PSEUDOMONAS AERUGINOSA MULTI-DRUG RESISTANT ORGANISM RESULT CALLED  TO, READ BACK BY AND VERIFIED WITH: T CROSS,RN AT 1030 08/16/15 BY L BENFIELD 30,000 COLONIES/mL PROTEUS MIRABILIS    Report Status 08/16/2015 FINAL  Final   Organism ID, Bacteria ESCHERICHIA COLI  Final   Organism ID, Bacteria PROVIDENCIA STUARTII  Final   Organism ID, Bacteria PROTEUS MIRABILIS  Final   Organism ID, Bacteria PSEUDOMONAS AERUGINOSA  Final      Susceptibility   Escherichia coli - MIC*    AMPICILLIN <=2 SENSITIVE Sensitive     CEFAZOLIN <=4 SENSITIVE Sensitive     CEFTRIAXONE <=1 SENSITIVE Sensitive     CIPROFLOXACIN >=4 RESISTANT Resistant     GENTAMICIN <=1 SENSITIVE Sensitive     IMIPENEM <=0.25 SENSITIVE Sensitive     NITROFURANTOIN <=16 SENSITIVE Sensitive     TRIMETH/SULFA <=20 SENSITIVE Sensitive     AMPICILLIN/SULBACTAM <=2 SENSITIVE Sensitive     PIP/TAZO <=4 SENSITIVE Sensitive     * 60,000 COLONIES/ml ESCHERICHIA COLI   Proteus mirabilis - MIC*    AMPICILLIN <=2 SENSITIVE Sensitive     CEFAZOLIN <=4 SENSITIVE Sensitive     CEFTRIAXONE <=1 SENSITIVE Sensitive     CIPROFLOXACIN <=0.25 SENSITIVE Sensitive     GENTAMICIN <=1 SENSITIVE Sensitive     IMIPENEM 4 SENSITIVE Sensitive     NITROFURANTOIN 128 RESISTANT Resistant     TRIMETH/SULFA <=20 SENSITIVE Sensitive     AMPICILLIN/SULBACTAM <=2 SENSITIVE Sensitive     PIP/TAZO <=4 SENSITIVE Sensitive     * 30,000 COLONIES/mL PROTEUS MIRABILIS   Pseudomonas aeruginosa - MIC*    CEFTAZIDIME 16 INTERMEDIATE Intermediate     CIPROFLOXACIN >=4 RESISTANT Resistant     GENTAMICIN 8 INTERMEDIATE Intermediate     IMIPENEM <=0.25 SENSITIVE Sensitive     CEFEPIME 16 INTERMEDIATE Intermediate     * 20,000  COLONIES/mL PSEUDOMONAS AERUGINOSA   Providencia stuartii - MIC*    AMPICILLIN >=32 RESISTANT Resistant     CEFAZOLIN >=64 RESISTANT Resistant     CEFTRIAXONE <=1 SENSITIVE Sensitive     CIPROFLOXACIN >=4 RESISTANT Resistant     GENTAMICIN >=16 RESISTANT Resistant     IMIPENEM 2 SENSITIVE Sensitive     NITROFURANTOIN 128 RESISTANT Resistant     TRIMETH/SULFA >=320 RESISTANT Resistant     AMPICILLIN/SULBACTAM >=32 RESISTANT Resistant     PIP/TAZO <=4 SENSITIVE Sensitive     * 20,000 COLONIES/mL PROVIDENCIA STUARTII  MRSA PCR Screening     Status: None   Collection Time: 08/12/15  2:52 PM  Result Value Ref Range Status   MRSA by PCR NEGATIVE NEGATIVE Final    Comment:        The GeneXpert MRSA Assay (FDA approved for NASAL specimens only), is one component of a comprehensive MRSA colonization surveillance program. It is not intended to diagnose MRSA infection nor to guide or monitor treatment for MRSA infections.   Anaerobic culture     Status: None (Preliminary result)   Collection Time: 08/13/15  2:20 PM  Result Value Ref Range Status   Specimen Description TISSUE  Final   Special Requests RIGHT LATERAL TRUNK  Final   Gram Stain PENDING  Incomplete   Culture   Final    NO ANAEROBES ISOLATED; CULTURE IN PROGRESS FOR 5 DAYS Performed at [facility]    Report Status PENDING  Incomplete  Tissue culture     Status: None   Collection Time: 08/13/15  2:20 PM  Result Value Ref Range Status   Specimen Description TISSUE  Final   Special  Requests RIGHT LATERAL TRUNK  Final   Gram Stain   Final    NO WBC SEEN NO ORGANISMS SEEN Performed at [facility]    Culture   Final    MODERATE SERRATIA MARCESCENS Performed at [facility]    Report Status 08/16/2015 FINAL  Final   Organism ID, Bacteria SERRATIA MARCESCENS  Final      Susceptibility   Serratia marcescens - MIC*    CEFAZOLIN >=64 RESISTANT Resistant     CEFEPIME <=1 SENSITIVE Sensitive      CEFTAZIDIME <=1 SENSITIVE Sensitive     CEFTRIAXONE <=1 SENSITIVE Sensitive     CIPROFLOXACIN <=0.25 SENSITIVE Sensitive     GENTAMICIN <=1 SENSITIVE Sensitive     TOBRAMYCIN 8 INTERMEDIATE Intermediate     TRIMETH/SULFA Value in next row Sensitive      <=20 SENSITIVE(NOTE)    * MODERATE SERRATIA MARCESCENS  Anaerobic culture     Status: None (Preliminary result)   Collection Time: 08/13/15  2:26 PM  Result Value Ref Range Status   Specimen Description BONE  Final   Special Requests BONE FROM RIGHT RIB  Final   Gram Stain   Final    NO WBC SEEN MODERATE GRAM POSITIVE COCCI IN PAIRS Performed at [facility]    Culture   Final    NO ANAEROBES ISOLATED; CULTURE IN PROGRESS FOR 5 DAYS Performed at [facility]    Report Status PENDING  Incomplete  Tissue culture     Status: None   Collection Time: 08/13/15  2:26 PM  Result Value Ref Range Status   Specimen Description BONE  Final   Special Requests BONE FROM RIGHT RIB  Final   Gram Stain   Final    NO WBC SEEN MODERATE GRAM POSITIVE COCCI IN PAIRS    Culture   Final    ABUNDANT PSEUDOMONAS AERUGINOSA ABUNDANT SERRATIA MARCESCENS    Report Status 08/17/2015 FINAL  Final   Organism ID, Bacteria PSEUDOMONAS AERUGINOSA  Final   Organism ID, Bacteria SERRATIA MARCESCENS  Final      Susceptibility   Pseudomonas aeruginosa - MIC*    CEFEPIME 2 SENSITIVE Sensitive     CEFTAZIDIME 4 SENSITIVE Sensitive     CIPROFLOXACIN 2 INTERMEDIATE Intermediate     GENTAMICIN <=1 SENSITIVE Sensitive     IMIPENEM 1 SENSITIVE Sensitive     PIP/TAZO 8 SENSITIVE Sensitive     TOBRAMYCIN Value in next row Sensitive      <=1 SENSITIVEPerformed at [facility]    * ABUNDANT PSEUDOMONAS AERUGINOSA   Serratia marcescens - MIC*    CEFAZOLIN Value in next row Resistant      <=1 SENSITIVEPerformed at [facility]    CEFEPIME Value in next row Sensitive      <=1 SENSITIVEPerformed at [facility]     CEFTAZIDIME Value in next row Sensitive      <=1 SENSITIVEPerformed at [facility]    CEFTRIAXONE Value in next row Sensitive      <=1 SENSITIVEPerformed at [facility]    CIPROFLOXACIN Value in next row Sensitive      <=1 SENSITIVEPerformed at [place] Value in next row Sensitive      <=1 SENSITIVEPerformed at [facility]    TOBRAMYCIN Value in next row Intermediate      <=1 SENSITIVEPerformed at [facility]    TRIMETH/SULFA Value in next row Sensitive      <=  20 SENSITIVE(NOTE)    * ABUNDANT SERRATIA MARCESCENS    Studies/Results: Ir Fluoro Guide Cv Line Right  08/17/2015  CLINICAL DATA:  Bacteremia, needs IV access for infusion therapy EXAM: PICC PLACEMENT WITH ULTRASOUND AND FLUOROSCOPY FLUOROSCOPY TIME:  0.1 minute, 6 uGym2 DAP TECHNIQUE: After written informed consent was obtained, patient was placed in the supine position on angiographic table. Patency of the right brachial vein was confirmed with ultrasound with image documentation. An appropriate skin site was determined. Skin site was marked. Region was prepped using maximum barrier technique including cap and mask, sterile gown, sterile gloves, large sterile sheet, and Chlorhexidine as cutaneous antisepsis. The region was infiltrated locally with 1% lidocaine. Under real-time ultrasound guidance, the right brachial vein was accessed with a 21 gauge micropuncture needle; the needle tip within the vein was confirmed with ultrasound image documentation. Needle exchanged over a 018 guidewire for a peel-away sheath, through which a 5-French double-lumen power injectable PICC trimmed to 41cm was advanced, positioned with its tip near the cavoatrial junction. Spot chest radiograph confirms appropriate catheter position. Catheter was flushed per protocol and secured externally. The patient tolerated procedure well. COMPLICATIONS: COMPLICATIONS none IMPRESSION: 1. Technically successful  five [place] double lumen power injectable PICC placement Electronically Signed   By: Lucrezia Europe M.D.   On: 08/17/2015 10:28   Ir US Guide Vasc Access Right  08/17/2015  CLINICAL DATA:  Bacteremia, needs IV access for infusion therapy EXAM: PICC PLACEMENT WITH ULTRASOUND AND FLUOROSCOPY FLUOROSCOPY TIME:  0.1 minute, 6 uGym2 DAP TECHNIQUE: After written informed consent was obtained, patient was placed in the supine position on angiographic table. Patency of the right brachial vein was confirmed with ultrasound with image documentation. An appropriate skin site was determined. Skin site was marked. Region was prepped using maximum barrier technique including cap and mask, sterile gown, sterile gloves, large sterile sheet, and Chlorhexidine as cutaneous antisepsis. The region was infiltrated locally with 1% lidocaine. Under real-time ultrasound guidance, the right brachial vein was accessed with a 21 gauge micropuncture needle; the needle tip within the vein was confirmed with ultrasound image documentation. Needle exchanged over a 018 guidewire for a peel-away sheath, through which a 5-French double-lumen power injectable PICC trimmed to 41cm was advanced, positioned with its tip near the cavoatrial junction. Spot chest radiograph confirms appropriate catheter position. Catheter was flushed per protocol and secured externally. The patient tolerated procedure well. COMPLICATIONS: COMPLICATIONS none IMPRESSION: 1. Technically successful five [place] double lumen power injectable PICC placement Electronically Signed   By: Lucrezia Europe M.D.   On: 08/17/2015 10:28   Dg Abd Portable 1v  08/17/2015  CLINICAL DATA:  Nausea vomiting and abdominal distention EXAM: PORTABLE ABDOMEN - 1 VIEW COMPARISON:  05/29/2015 CT scan FINDINGS: There is pronounced severe gastric distention. There is gas further into bowel. IMPRESSION: Although there is some gas beyond the stomach, there is severe gastric gaseous distention. Possibility of  gastric outlet obstruction not excluded. Electronically Signed   By: Skipper Cliche M.D.   On: 08/17/2015 07:02      Assessment/Plan:  INTERVAL HISTORY:   08/17/15: Pseudomonas and Serratia growing from his rib bone cultures   Principal Problem:   Fever Active Problems:   Quadriplegia ([name]   Sacral decubitus ulcer, stage IV ([name]   Suprapubic catheter ([name]   Chronic osteomyelitis, pelvic region and thigh ([name]   Severe protein-calorie malnutrition ([name]   Iron deficiency anemia   Sepsis ([place])   Bacteremia   Sacral decubitus ulcer  Osteomyelitis, pelvic region and thigh ([place])    [name] [name] is a 50 y.o. male with  chronic pelvic osteomyeliitis now with thoracic wound going down to rib sp I and D with Serratia growing on cultures so far  #1 Thoracic and pelvic osteomyelitis:  --patient is unlikely to be able to cure any of these areas but hopefully we can allow him to make som progress with the surgical intervention he has had plus course of parenteral antibiotics  We will broaden back out to cover the pseudomonas isolated from his rib bone  NOTE there was [facility] seen on GS but never grown. My pharmacy residents have asked Micro to re-examine the GS. If micro still feel that [facility] seen on GS will add back IV  Vancomycin to cover for MR Coag Neg Staph that might not have grown in culture  He will need 6 weeks of IV therapy  #2 Stomach distention and possible gastric outlet problem: Vertigo primary team and GI   LOS: 5 days   Alcide Evener 08/17/2015, 6:24 PM

## 2015-08-17 NOTE — Progress Notes (Signed)
Diane with [employer] called , stated the rep did not send the wound vac out yesterday, they will get the wound vac out today between 9am and 2 pm .

## 2015-08-17 NOTE — Procedures (Signed)
R arm PowerPICC placed under US and fluoroscopy No ptx on spot chest radiograph. No complication No blood loss. See complete dictation in [facility] PACS.  

## 2015-08-17 NOTE — Care Management Note (Signed)
Case Management Note  Patient Details  Name: RESHAD PERKOWSKI MRN: [id number] Date of Birth: March 07, 1966  Subjective/Objective:                    Action/Plan:  Home VAC at bedside , patient from home with caregiver [name] Expected Discharge Date:                  Expected Discharge Plan:  [place]  In-House Referral:     Discharge planning Services  CM Consult  Post Acute Care Choice:  [place], Resumption of Svcs/PTA Provider Choice offered to:  Patient  DME Arranged:  Vac DME Agency:  [facility]  HH Arranged:  RN, IV Antibiotics HH Agency:  [facility]  Status of Service:  In process, will continue to follow  Medicare Important Message Given:    Date Medicare IM Given:    Medicare IM give by:    Date Additional Medicare IM Given:    Additional Medicare Important Message give by:     If discussed at [place] of Stay Meetings, dates discussed:    Additional Comments:  [name], RN 08/17/2015, 12:41 PM

## 2015-08-17 NOTE — Progress Notes (Signed)
[name] [name] [id number] DOB: 08-Sep-1965 DOA: 08/11/2015 PCP: [name], MD  Brief narrative:  50 y/o ?   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 He has had nonhealing sacral wounds for the past 3-4 years, history of osteomyelitis, and a multiple other infections.  He has also had a diverting ostomy placed at [facility] back in 2007 He has NOT been deeemd a good candidate for any type of surg by [facility] or [name] in the past because of lack of Flap and on-going viability of the same in a chronic debilitated and contractured state  Admitted to hospital from ID clinic for ? 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   Past medical history-As per Problem list Chart reviewed as below-   Consultants:  Plastics  Procedures:  Plastic surgery in OR 3/29  Antibiotics:  None yet   Subjective   Distended and vomiting since last night I was not informed Sr. in room No nausea at present Has not eaten much today at all No gas coming out of ostomy      Objective    Interim History:    Telemetry: sinus   Objective: Filed Vitals:   08/17/15 0206 08/17/15 0409 08/17/15 0810 08/17/15 1551  BP: 145/89 133/85 85/64 116/66  Pulse: 79 86 84 79  Temp: 98.6 F (37 C) 99 F (37.2 C) 98.8 F (37.1 C) 98.8 F (37.1 C)  TempSrc: Oral Oral Oral Oral  Resp: 22 20 19 22   Height:      Weight:      SpO2: 100% 99% 99% 98%    Intake/Output Summary (Last 24 hours) at 08/17/15 1712 Last data filed at 08/17/15 1421  Gross per 24 hour  Intake 2720.42 ml  Output   4550 ml  Net -1829.58 ml    Exam:  General: alert pleasant conversant Chest clear Grossly distended nontender ostomy bag without any gas Wounds not examined today       Data Reviewed: Basic Metabolic Panel:  Recent Labs Lab 08/11/15 2222 08/12/15 0434 08/12/15 0753 08/15/15 0615 08/15/15 0900  NA 142 141  --  141  --   K 3.3* 3.4*  --  5.2* 4.6  CL 105 109  --  112*  --   CO2 24 23  --  21*  --   GLUCOSE 150* 96  --  101*  --   BUN 13 14  --  8  --   CREATININE 0.98 0.78  --  0.51*  --   CALCIUM 9.2 8.4*  --  8.0*  --   MG  --   --  1.5*  --   --    Liver Function Tests:  Recent Labs Lab 08/11/15 2222 08/12/15 0434  AST 21 19  ALT 16* 12*  ALKPHOS 84 63  BILITOT 0.3 0.5  PROT 8.1 5.9*  ALBUMIN 2.5* 2.0*   No results for input(s): LIPASE, AMYLASE in the last 168 hours. No results for input(s): AMMONIA in the last 168 hours. CBC:  Recent Labs Lab 08/11/15 2222 08/12/15 0434 08/15/15 0615 08/16/15 1641  WBC 23.3* 17.0* 14.1* 13.0*  NEUTROABS 20.1*  --  8.9*  --   HGB 9.6* 7.4* 7.1* 6.9*  HCT 31.7* 24.2* 23.3* 22.2*  MCV 79.1 79.9  79.0 79.0  PLT 575* 485* 438* 435*   Cardiac Enzymes: No results for input(s): CKTOTAL, CKMB, CKMBINDEX, TROPONINI in the last 168 hours. BNP: Invalid input(s): POCBNP CBG: No results for input(s): GLUCAP in the last 168 hours.  Recent Results (from the past 240 hour(s))  Culture, blood (Routine X 2) w Reflex to ID Panel     Status: None   Collection Time: 08/11/15 10:23 PM  Result Value Ref Range Status   Specimen Description BLOOD LEFT HAND  Final   Special Requests IN PEDIATRIC BOTTLE 1ML  Final   Culture  Setup Time   Final    GRAM POSITIVE COCCI IN CLUSTERS AEROBIC BOTTLE ONLY CRITICAL RESULT CALLED TO, READ BACK BY AND VERIFIED WITH: T IRBY,RN @0203  08/13/15 MKELLY    Culture   Final    STAPHYLOCOCCUS SPECIES (COAGULASE NEGATIVE) THE SIGNIFICANCE OF ISOLATING THIS ORGANISM FROM A SINGLE SET OF BLOOD CULTURES WHEN MULTIPLE SETS ARE DRAWN IS UNCERTAIN. PLEASE NOTIFY THE MICROBIOLOGY DEPARTMENT WITHIN ONE WEEK IF SPECIATION AND SENSITIVITIES ARE REQUIRED.    Report Status  08/15/2015 FINAL  Final  Culture, blood (Routine X 2) w Reflex to ID Panel     Status: None   Collection Time: 08/12/15  4:15 AM  Result Value Ref Range Status   Specimen Description BLOOD RIGHT ANTECUBITAL  Final   Special Requests IN PEDIATRIC BOTTLE 3CC  Final   Culture NO GROWTH 5 DAYS  Final   Report Status 08/17/2015 FINAL  Final  Urine culture     Status: None   Collection Time: 08/12/15 10:33 AM  Result Value Ref Range Status   Specimen Description URINE, SUPRAPUBIC  Final   Special Requests NONE  Final   Culture   Final    60,000 COLONIES/ml ESCHERICHIA COLI 20,000 COLONIES/mL PROVIDENCIA STUARTII 20,000 COLONIES/mL PSEUDOMONAS AERUGINOSA MULTI-DRUG RESISTANT ORGANISM RESULT CALLED TO, READ BACK BY AND VERIFIED WITH: T CROSS,RN AT 1030 08/16/15 BY L BENFIELD 30,000 COLONIES/mL PROTEUS MIRABILIS    Report Status 08/16/2015 FINAL  Final   Organism ID, Bacteria ESCHERICHIA COLI  Final   Organism ID, Bacteria PROVIDENCIA STUARTII  Final   Organism ID, Bacteria PROTEUS MIRABILIS  Final   Organism ID, Bacteria PSEUDOMONAS AERUGINOSA  Final      Susceptibility   Escherichia coli - MIC*    AMPICILLIN <=2 SENSITIVE Sensitive     CEFAZOLIN <=4 SENSITIVE Sensitive     CEFTRIAXONE <=1 SENSITIVE Sensitive     CIPROFLOXACIN >=4 RESISTANT Resistant     GENTAMICIN <=1 SENSITIVE Sensitive     IMIPENEM <=0.25 SENSITIVE Sensitive     NITROFURANTOIN <=16 SENSITIVE Sensitive     TRIMETH/SULFA <=20 SENSITIVE Sensitive     AMPICILLIN/SULBACTAM <=2 SENSITIVE Sensitive     PIP/TAZO <=4 SENSITIVE Sensitive     * 60,000 COLONIES/ml ESCHERICHIA COLI   Proteus mirabilis - MIC*    AMPICILLIN <=2 SENSITIVE Sensitive     CEFAZOLIN <=4 SENSITIVE Sensitive     CEFTRIAXONE <=1 SENSITIVE Sensitive     CIPROFLOXACIN <=0.25 SENSITIVE Sensitive     GENTAMICIN <=1 SENSITIVE Sensitive     IMIPENEM 4 SENSITIVE Sensitive     NITROFURANTOIN 128 RESISTANT Resistant     TRIMETH/SULFA <=20 SENSITIVE Sensitive      AMPICILLIN/SULBACTAM <=2 SENSITIVE Sensitive     PIP/TAZO <=4 SENSITIVE Sensitive     * 30,000 COLONIES/mL PROTEUS MIRABILIS   Pseudomonas aeruginosa - MIC*    CEFTAZIDIME 16 INTERMEDIATE Intermediate     CIPROFLOXACIN >=4 RESISTANT Resistant  GENTAMICIN 8 INTERMEDIATE Intermediate     IMIPENEM <=0.25 SENSITIVE Sensitive     CEFEPIME 16 INTERMEDIATE Intermediate     * 20,000 COLONIES/mL PSEUDOMONAS AERUGINOSA   Providencia stuartii - MIC*    AMPICILLIN >=32 RESISTANT Resistant     CEFAZOLIN >=64 RESISTANT Resistant     CEFTRIAXONE <=1 SENSITIVE Sensitive     CIPROFLOXACIN >=4 RESISTANT Resistant     GENTAMICIN >=16 RESISTANT Resistant     IMIPENEM 2 SENSITIVE Sensitive     NITROFURANTOIN 128 RESISTANT Resistant     TRIMETH/SULFA >=320 RESISTANT Resistant     AMPICILLIN/SULBACTAM >=32 RESISTANT Resistant     PIP/TAZO <=4 SENSITIVE Sensitive     * 20,000 COLONIES/mL PROVIDENCIA STUARTII  MRSA PCR Screening     Status: None   Collection Time: 08/12/15  2:52 PM  Result Value Ref Range Status   MRSA by PCR NEGATIVE NEGATIVE Final    Comment:        The GeneXpert MRSA Assay (FDA approved for NASAL specimens only), is one component of a comprehensive MRSA colonization surveillance program. It is not intended to diagnose MRSA infection nor to guide or monitor treatment for MRSA infections.   Anaerobic culture     Status: None (Preliminary result)   Collection Time: 08/13/15  2:20 PM  Result Value Ref Range Status   Specimen Description TISSUE  Final   Special Requests RIGHT LATERAL TRUNK  Final   Gram Stain PENDING  Incomplete   Culture   Final    NO ANAEROBES ISOLATED; CULTURE IN PROGRESS FOR 5 DAYS Performed at [facility]    Report Status PENDING  Incomplete  Tissue culture     Status: None   Collection Time: 08/13/15  2:20 PM  Result Value Ref Range Status   Specimen Description TISSUE  Final   Special Requests RIGHT LATERAL TRUNK  Final   Gram Stain    Final    NO WBC SEEN NO ORGANISMS SEEN Performed at [facility]    Culture   Final    MODERATE SERRATIA MARCESCENS Performed at [facility]    Report Status 08/16/2015 FINAL  Final   Organism ID, Bacteria SERRATIA MARCESCENS  Final      Susceptibility   Serratia marcescens - MIC*    CEFAZOLIN >=64 RESISTANT Resistant     CEFEPIME <=1 SENSITIVE Sensitive     CEFTAZIDIME <=1 SENSITIVE Sensitive     CEFTRIAXONE <=1 SENSITIVE Sensitive     CIPROFLOXACIN <=0.25 SENSITIVE Sensitive     GENTAMICIN <=1 SENSITIVE Sensitive     TOBRAMYCIN 8 INTERMEDIATE Intermediate     TRIMETH/SULFA Value in next row Sensitive      <=20 SENSITIVE(NOTE)    * MODERATE SERRATIA MARCESCENS  Anaerobic culture     Status: None (Preliminary result)   Collection Time: 08/13/15  2:26 PM  Result Value Ref Range Status   Specimen Description BONE  Final   Special Requests BONE FROM RIGHT RIB  Final   Gram Stain   Final    NO WBC SEEN MODERATE GRAM POSITIVE COCCI IN PAIRS Performed at [facility]    Culture   Final    NO ANAEROBES ISOLATED; CULTURE IN PROGRESS FOR 5 DAYS Performed at [facility]    Report Status PENDING  Incomplete  Tissue culture     Status: None   Collection Time: 08/13/15  2:26 PM  Result Value Ref Range Status   Specimen Description BONE  Final  Special Requests BONE FROM RIGHT RIB  Final   Gram Stain   Final    NO WBC SEEN MODERATE GRAM POSITIVE COCCI IN PAIRS    Culture   Final    ABUNDANT PSEUDOMONAS AERUGINOSA ABUNDANT SERRATIA MARCESCENS    Report Status 08/17/2015 FINAL  Final   Organism ID, Bacteria PSEUDOMONAS AERUGINOSA  Final   Organism ID, Bacteria SERRATIA MARCESCENS  Final      Susceptibility   Pseudomonas aeruginosa - MIC*    CEFEPIME 2 SENSITIVE Sensitive     CEFTAZIDIME 4 SENSITIVE Sensitive     CIPROFLOXACIN 2 INTERMEDIATE Intermediate     GENTAMICIN <=1 SENSITIVE Sensitive     IMIPENEM 1 SENSITIVE Sensitive      PIP/TAZO 8 SENSITIVE Sensitive     TOBRAMYCIN Value in next row Sensitive      <=1 SENSITIVEPerformed at [place]   Serratia marcescens - MIC*    CEFAZOLIN Value in next row Resistant      <=1 SENSITIVEPerformed at [facility]    CEFEPIME Value in next row Sensitive      <=1 SENSITIVEPerformed at [facility]    CEFTAZIDIME Value in next row Sensitive      <=1 SENSITIVEPerformed at [facility]    CEFTRIAXONE Value in next row Sensitive      <=1 SENSITIVEPerformed at [facility]    CIPROFLOXACIN Value in next row Sensitive      <=1 SENSITIVEPerformed at [place] Value in next row Sensitive      <=1 SENSITIVEPerformed at [facility]    TOBRAMYCIN Value in next row Intermediate      <=1 SENSITIVEPerformed at [facility]    TRIMETH/SULFA Value in next row Sensitive      <=20 SENSITIVE(NOTE)    * ABUNDANT SERRATIA MARCESCENS     Studies:              All Imaging reviewed and is as per above notation   Scheduled Meds: . sodium chloride   Intravenous Once  . antiseptic oral rinse  7 mL Mouth Rinse BID  . baclofen  20 mg Oral QID  . ceFEPime (MAXIPIME) IV  2 g Intravenous 3 times per day  . collagenase   Topical Daily  . darifenacin  15 mg Oral Daily  . diclofenac sodium  2 g Topical QID  . docusate sodium  200 mg Oral BID  . ferrous sulfate  325 mg Oral TID WC  . lactose free nutrition  237 mL Oral TID BM  . lidocaine      . magnesium oxide  400 mg Oral Daily  . metoCLOPramide  10 mg Oral TID AC  . multivitamin with minerals  1 tablet Oral Daily  . nutrition supplement (JUVEN)  1 packet Oral BID BM  . ondansetron      . pantoprazole  40 mg Oral BID AC  . sucralfate  1 g Oral QID   Continuous Infusions: . sodium chloride Stopped (08/16/15 1340)     Assessment/Plan:  1. Sepsis-?Rib osteo-for tisseu sampling 3/29.  Appreciate Dr. Royce Macadamia input.   Hold abx.  Await suprapubic cult-for wounds , WOC nurse to address dressing in conjunction with suggestions from Dr. Rolm Bookbinder you--setting of wound VAC should be 125 per plastic surgeon. Culture from right rib growing serratia [?bone biopsy?] wounds reexamined 08/15/15--appreciate help from ID Dr. Lucianne Lei dam  to determine  antibiotics--sounds like IV ceftriaxone x 6 weeks 2. Small bowel obstruction? Gastric outlet obstruction-place NG, nothing by mouth except for meds, start D5 saline 75 cc per hour get stat Chem-12, CBC. General surgery consulted to assist with management given possible obstruction-CT scan abdomen pelvis with contrast pending 3. MDR Pseudomonas Pyelo-see above discussion-as per ID 4. Anemia-multifactorial-nutitional + chr slow blood loss from sacral wounds-type and screen.  Transfuse 2 u PRBC 08/16/15-cbc in am 08/18/15 5. H/o paraplegia-difficult situation-although mentally competent-physically declining-i have broached goals of care gently-patient is to tell us when "enough is enough"-continue suportive baclofen, monitor Ostomy etc.  Discussed with sister who understands Inpatient   [name], MD  [facility] Pager [phone] 08/17/2015, 5:12 PM    LOS: 5 days

## 2015-08-18 DIAGNOSIS — Z978 Presence of other specified devices: Secondary | ICD-10-CM

## 2015-08-18 LAB — CBC
HCT: 24.9 % — ABNORMAL LOW (ref 39.0–52.0)
HEMOGLOBIN: 7.9 g/dL — AB (ref 13.0–17.0)
MCH: 25.2 pg — ABNORMAL LOW (ref 26.0–34.0)
MCHC: 31.7 g/dL (ref 30.0–36.0)
MCV: 79.3 fL (ref 78.0–100.0)
Platelets: 487 10*3/uL — ABNORMAL HIGH (ref 150–400)
RBC: 3.14 MIL/uL — ABNORMAL LOW (ref 4.22–5.81)
RDW: 16.4 % — ABNORMAL HIGH (ref 11.5–15.5)
WBC: 16.9 10*3/uL — ABNORMAL HIGH (ref 4.0–10.5)

## 2015-08-18 LAB — COMPREHENSIVE METABOLIC PANEL
ALK PHOS: 69 U/L (ref 38–126)
ALT: 12 U/L — ABNORMAL LOW (ref 17–63)
ANION GAP: 9 (ref 5–15)
AST: 15 U/L (ref 15–41)
Albumin: 1.6 g/dL — ABNORMAL LOW (ref 3.5–5.0)
BILIRUBIN TOTAL: 0.5 mg/dL (ref 0.3–1.2)
BUN: 6 mg/dL (ref 6–20)
CALCIUM: 6.4 mg/dL — AB (ref 8.9–10.3)
CO2: 26 mmol/L (ref 22–32)
Chloride: 106 mmol/L (ref 101–111)
Creatinine, Ser: 0.35 mg/dL — ABNORMAL LOW (ref 0.61–1.24)
GFR calc non Af Amer: >60 mL/min (ref >60)
GLUCOSE: 86 mg/dL (ref 65–99)
Potassium: 4.4 mmol/L (ref 3.5–5.1)
Sodium: 141 mmol/L (ref 135–145)
TOTAL PROTEIN: 5.9 g/dL — AB (ref 6.5–8.1)

## 2015-08-18 LAB — ANAEROBIC CULTURE
GRAM STAIN: NONE SEEN
Gram Stain: NONE SEEN

## 2015-08-18 NOTE — Progress Notes (Signed)
Subjective:  Abdominal pain is better after NG tube placement  Antibiotics:  Anti-infectives    Start     Dose/Rate Route Frequency Ordered Stop   08/17/15 1400  ceFEPIme (MAXIPIME) 2 g in dextrose 5 % 50 mL IVPB     2 g 100 mL/hr over 30 Minutes Intravenous 3 times per day 08/17/15 1053     08/16/15 2000  cefTRIAXone (ROCEPHIN) 2 g in dextrose 5 % 50 mL IVPB  Status:  Discontinued     2 g 100 mL/hr over 30 Minutes Intravenous Every 24 hours 08/16/15 1846 08/17/15 1052   08/15/15 1800  ceFEPIme (MAXIPIME) 2 g in dextrose 5 % 50 mL IVPB  Status:  Discontinued     2 g 100 mL/hr over 30 Minutes Intravenous Every 12 hours 08/15/15 1443 08/16/15 1846   08/15/15 1800  vancomycin (VANCOCIN) 1,250 mg in sodium chloride 0.9 % 250 mL IVPB  Status:  Discontinued     1,250 mg 166.7 mL/hr over 90 Minutes Intravenous Every 12 hours 08/15/15 1449 08/16/15 1846   08/14/15 1500  cefTRIAXone (ROCEPHIN) 1 g in dextrose 5 % 50 mL IVPB  Status:  Discontinued     1 g 100 mL/hr over 30 Minutes Intravenous Every 24 hours 08/14/15 1339 08/15/15 1438   08/13/15 1451  polymyxin B 500,000 Units, bacitracin 50,000 Units in sodium chloride irrigation 0.9 % 500 mL irrigation  Status:  Discontinued       As needed 08/13/15 1452 08/13/15 1512   08/13/15 1345  vancomycin (VANCOCIN) IVPB 1000 mg/200 mL premix     1,000 mg 200 mL/hr over 60 Minutes Intravenous To Surgery 08/13/15 1334 08/13/15 1500   08/13/15 1345  piperacillin-tazobactam (ZOSYN) IVPB 3.375 g  Status:  Discontinued     3.375 g 12.5 mL/hr over 240 Minutes Intravenous To Surgery 08/13/15 1334 08/13/15 1639   08/11/15 2145  piperacillin-tazobactam (ZOSYN) IVPB 3.375 g  Status:  Discontinued     3.375 g 100 mL/hr over 30 Minutes Intravenous  Once 08/11/15 2136 08/12/15 0020   08/11/15 2145  vancomycin (VANCOCIN) IVPB 1000 mg/200 mL premix  Status:  Discontinued     1,000 mg 200 mL/hr over 60 Minutes Intravenous  Once 08/11/15 2136 08/12/15  0020      Medications: Scheduled Meds: . sodium chloride   Intravenous Once  . antiseptic oral rinse  7 mL Mouth Rinse BID  . baclofen  20 mg Oral QID  . ceFEPime (MAXIPIME) IV  2 g Intravenous 3 times per day  . collagenase   Topical Daily  . darifenacin  15 mg Oral Daily  . diclofenac sodium  2 g Topical QID  . ferrous sulfate  325 mg Oral TID WC  . lactose free nutrition  237 mL Oral TID BM  . magnesium oxide  400 mg Oral Daily  . metoCLOPramide  10 mg Oral TID AC  . multivitamin with minerals  1 tablet Oral Daily  . nutrition supplement (JUVEN)  1 packet Oral BID BM  . pantoprazole  40 mg Oral BID AC  . sucralfate  1 g Oral QID   Continuous Infusions: . dextrose 5 % and 0.9% NaCl 75 mL/hr at 08/18/15 1538   PRN Meds:.acetaminophen **OR** acetaminophen, HYDROcodone-acetaminophen, ondansetron **OR** ondansetron (ZOFRAN) IV, prochlorperazine, senna-docusate, sodium chloride flush    Objective: Weight change:   Intake/Output Summary (Last 24 hours) at 08/18/15 1730 Last data filed at 08/18/15 1316  Gross per 24 hour  Intake   1395 ml  Output   4100 ml  Net  -2705 ml   Blood pressure 130/78, pulse 75, temperature 98.4 F (36.9 C), temperature source Oral, resp. rate 20, height 6' (1.829 m), weight 203 lb (92.08 kg), SpO2 97 %. Temp:  [98.1 F (36.7 C)-99 F (37.2 C)] 98.4 F (36.9 C) (04/04 1312) Pulse Rate:  [75-85] 75 (04/04 1312) Resp:  [20-22] 20 (04/04 1312) BP: (105-130)/(65-78) 130/78 mmHg (04/04 1312) SpO2:  [97 %-100 %] 97 % (04/04 1312)  Physical Exam: General: Alert and awake, oriented x3, not in any acute distress. HEENT: anicteric sclera, pupils reactive to light and accommodation, EOMI CVS regular rate, normal r,  no murmur rubs or gallops Chest: clear to auscultation bilaterally, no wheezing, rales or rhonchi Abdomen: distended and tender diffusely, MSK: wounds not examined Skin: no rashes  Neuro: quadraplegic  CBC: . CBC Latest Ref Rng  08/18/2015 08/16/2015 08/15/2015  WBC 4.0 - 10.5 K/uL 16.9(H) 13.0(H) 14.1(H)  Hemoglobin 13.0 - 17.0 g/dL 7.9(L) 6.9(LL) 7.1(L)  Hematocrit 39.0 - 52.0 % 24.9(L) 22.2(L) 23.3(L)  Platelets 150 - 400 K/uL 487(H) 435(H) 438(H)       BMET  Recent Labs  08/17/15 2018 08/18/15 0448  NA 142 141  K 2.6* 4.4  CL 106 106  CO2 26 26  GLUCOSE 112* 86  BUN 7 6  CREATININE 0.40* 0.35*  CALCIUM 8.1* 6.4*     Liver Panel   Recent Labs  08/17/15 2018 08/18/15 0448  PROT 5.8* 5.9*  ALBUMIN 1.7* 1.6*  AST 13* 15  ALT 14* 12*  ALKPHOS 70 69  BILITOT 0.5 0.5       Sedimentation Rate No results for input(s): ESRSEDRATE in the last 72 hours. C-Reactive Protein No results for input(s): CRP in the last 72 hours.  Micro Results: Recent Results (from the past 720 hour(s))  Culture, blood (Routine X 2) w Reflex to ID Panel     Status: None   Collection Time: 08/11/15 10:23 PM  Result Value Ref Range Status   Specimen Description BLOOD LEFT HAND  Final   Special Requests IN PEDIATRIC BOTTLE 1ML  Final   Culture  Setup Time   Final    GRAM POSITIVE COCCI IN CLUSTERS AEROBIC BOTTLE ONLY CRITICAL RESULT CALLED TO, READ BACK BY AND VERIFIED WITH: T IRBY,RN _0  08/13/15 MKELLY    Culture   Final    STAPHYLOCOCCUS SPECIES (COAGULASE NEGATIVE) THE SIGNIFICANCE OF ISOLATING THIS ORGANISM FROM A SINGLE SET OF BLOOD CULTURES WHEN MULTIPLE SETS ARE DRAWN IS UNCERTAIN. PLEASE NOTIFY THE MICROBIOLOGY DEPARTMENT WITHIN ONE WEEK IF SPECIATION AND SENSITIVITIES ARE REQUIRED.    Report Status 08/15/2015 FINAL  Final  Culture, blood (Routine X 2) w Reflex to ID Panel     Status: None   Collection Time: 08/12/15  4:15 AM  Result Value Ref Range Status   Specimen Description BLOOD RIGHT ANTECUBITAL  Final   Special Requests IN PEDIATRIC BOTTLE 3CC  Final   Culture NO GROWTH 5 DAYS  Final   Report Status 08/17/2015 FINAL  Final  Urine culture     Status: None   Collection Time: 08/12/15 10:33  AM  Result Value Ref Range Status   Specimen Description URINE, SUPRAPUBIC  Final   Special Requests NONE  Final   Culture   Final    60,000 COLONIES/ml ESCHERICHIA COLI 20,000 COLONIES/mL PROVIDENCIA STUARTII 20,000 COLONIES/mL PSEUDOMONAS AERUGINOSA MULTI-DRUG RESISTANT ORGANISM RESULT CALLED TO, READ BACK BY AND VERIFIED  WITH: T CROSS,RN AT 1030 08/16/15 BY L BENFIELD 30,000 COLONIES/mL PROTEUS MIRABILIS    Report Status 08/16/2015 FINAL  Final   Organism ID, Bacteria ESCHERICHIA COLI  Final   Organism ID, Bacteria PROVIDENCIA STUARTII  Final   Organism ID, Bacteria PROTEUS MIRABILIS  Final   Organism ID, Bacteria PSEUDOMONAS AERUGINOSA  Final      Susceptibility   Escherichia coli - MIC*    AMPICILLIN <=2 SENSITIVE Sensitive     CEFAZOLIN <=4 SENSITIVE Sensitive     CEFTRIAXONE <=1 SENSITIVE Sensitive     CIPROFLOXACIN >=4 RESISTANT Resistant     GENTAMICIN <=1 SENSITIVE Sensitive     IMIPENEM <=0.25 SENSITIVE Sensitive     NITROFURANTOIN <=16 SENSITIVE Sensitive     TRIMETH/SULFA <=20 SENSITIVE Sensitive     AMPICILLIN/SULBACTAM <=2 SENSITIVE Sensitive     PIP/TAZO <=4 SENSITIVE Sensitive     * 60,000 COLONIES/ml ESCHERICHIA COLI   Proteus mirabilis - MIC*    AMPICILLIN <=2 SENSITIVE Sensitive     CEFAZOLIN <=4 SENSITIVE Sensitive     CEFTRIAXONE <=1 SENSITIVE Sensitive     CIPROFLOXACIN <=0.25 SENSITIVE Sensitive     GENTAMICIN <=1 SENSITIVE Sensitive     IMIPENEM 4 SENSITIVE Sensitive     NITROFURANTOIN 128 RESISTANT Resistant     TRIMETH/SULFA <=20 SENSITIVE Sensitive     AMPICILLIN/SULBACTAM <=2 SENSITIVE Sensitive     PIP/TAZO <=4 SENSITIVE Sensitive     * 30,000 COLONIES/mL PROTEUS MIRABILIS   Pseudomonas aeruginosa - MIC*    CEFTAZIDIME 16 INTERMEDIATE Intermediate     CIPROFLOXACIN >=4 RESISTANT Resistant     GENTAMICIN 8 INTERMEDIATE Intermediate     IMIPENEM <=0.25 SENSITIVE Sensitive     CEFEPIME 16 INTERMEDIATE Intermediate     * 20,000 COLONIES/mL  PSEUDOMONAS AERUGINOSA   Providencia stuartii - MIC*    AMPICILLIN >=32 RESISTANT Resistant     CEFAZOLIN >=64 RESISTANT Resistant     CEFTRIAXONE <=1 SENSITIVE Sensitive     CIPROFLOXACIN >=4 RESISTANT Resistant     GENTAMICIN >=16 RESISTANT Resistant     IMIPENEM 2 SENSITIVE Sensitive     NITROFURANTOIN 128 RESISTANT Resistant     TRIMETH/SULFA >=320 RESISTANT Resistant     AMPICILLIN/SULBACTAM >=32 RESISTANT Resistant     PIP/TAZO <=4 SENSITIVE Sensitive     * 20,000 COLONIES/mL PROVIDENCIA STUARTII  MRSA PCR Screening     Status: None   Collection Time: 08/12/15  2:52 PM  Result Value Ref Range Status   MRSA by PCR NEGATIVE NEGATIVE Final    Comment:        The GeneXpert MRSA Assay (FDA approved for NASAL specimens only), is one component of a comprehensive MRSA colonization surveillance program. It is not intended to diagnose MRSA infection nor to guide or monitor treatment for MRSA infections.   Anaerobic culture     Status: None   Collection Time: 08/13/15  2:20 PM  Result Value Ref Range Status   Specimen Description TISSUE  Final   Special Requests RIGHT LATERAL TRUNK  Final   Gram Stain   Final    NO WBC SEEN NO ORGANISMS SEEN Performed at [facility]    Culture   Final    NO ANAEROBES ISOLATED Performed at [facility]    Report Status 08/18/2015 FINAL  Final  Tissue culture     Status: None   Collection Time: 08/13/15  2:20 PM  Result Value Ref Range Status   Specimen Description TISSUE  Final  Special Requests RIGHT LATERAL TRUNK  Final   Gram Stain   Final    NO WBC SEEN NO ORGANISMS SEEN Performed at [facility]    Culture   Final    MODERATE SERRATIA MARCESCENS Performed at [facility]    Report Status 08/16/2015 FINAL  Final   Organism ID, Bacteria SERRATIA MARCESCENS  Final      Susceptibility   Serratia marcescens - MIC*    CEFAZOLIN >=64 RESISTANT Resistant     CEFEPIME <=1 SENSITIVE Sensitive      CEFTAZIDIME <=1 SENSITIVE Sensitive     CEFTRIAXONE <=1 SENSITIVE Sensitive     CIPROFLOXACIN <=0.25 SENSITIVE Sensitive     GENTAMICIN <=1 SENSITIVE Sensitive     TOBRAMYCIN 8 INTERMEDIATE Intermediate     TRIMETH/SULFA Value in next row Sensitive      <=20 SENSITIVE(NOTE)    * MODERATE SERRATIA MARCESCENS  Anaerobic culture     Status: None   Collection Time: 08/13/15  2:26 PM  Result Value Ref Range Status   Specimen Description BONE  Final   Special Requests BONE FROM RIGHT RIB  Final   Gram Stain   Final    NO WBC SEEN MODERATE GRAM POSITIVE COCCI IN PAIRS Performed at [facility]    Culture   Final    FEW BACTEROIDES FRAGILIS Note: BETA LACTAMASE POSITIVE Performed at [facility]    Report Status 08/18/2015 FINAL  Final  Tissue culture     Status: None   Collection Time: 08/13/15  2:26 PM  Result Value Ref Range Status   Specimen Description BONE  Final   Special Requests BONE FROM RIGHT RIB  Final   Gram Stain   Final    NO WBC SEEN MODERATE GRAM POSITIVE COCCI IN PAIRS    Culture   Final    ABUNDANT PSEUDOMONAS AERUGINOSA ABUNDANT SERRATIA MARCESCENS    Report Status 08/17/2015 FINAL  Final   Organism ID, Bacteria PSEUDOMONAS AERUGINOSA  Final   Organism ID, Bacteria SERRATIA MARCESCENS  Final      Susceptibility   Pseudomonas aeruginosa - MIC*    CEFEPIME 2 SENSITIVE Sensitive     CEFTAZIDIME 4 SENSITIVE Sensitive     CIPROFLOXACIN 2 INTERMEDIATE Intermediate     GENTAMICIN <=1 SENSITIVE Sensitive     IMIPENEM 1 SENSITIVE Sensitive     PIP/TAZO 8 SENSITIVE Sensitive     TOBRAMYCIN Value in next row Sensitive      <=1 SENSITIVEPerformed at [place]   Serratia marcescens - MIC*    CEFAZOLIN Value in next row Resistant      <=1 SENSITIVEPerformed at [facility]    CEFEPIME Value in next row Sensitive      <=1 SENSITIVEPerformed at [facility]    CEFTAZIDIME Value in  next row Sensitive      <=1 SENSITIVEPerformed at [facility]    CEFTRIAXONE Value in next row Sensitive      <=1 SENSITIVEPerformed at [facility]    CIPROFLOXACIN Value in next row Sensitive      <=1 SENSITIVEPerformed at [place] Value in next row Sensitive      <=1 SENSITIVEPerformed at [facility]    TOBRAMYCIN Value in next row Intermediate      <=1 SENSITIVEPerformed at [facility]    TRIMETH/SULFA Value in next row Sensitive      <=  20 SENSITIVE(NOTE)    * ABUNDANT SERRATIA MARCESCENS    Studies/Results: Ct Abdomen Pelvis W Contrast  08/18/2015  CLINICAL DATA:  Small bowel obstruction. EXAM: CT ABDOMEN AND PELVIS WITH CONTRAST TECHNIQUE: Multidetector CT imaging of the abdomen and pelvis was performed using the standard protocol following bolus administration of intravenous contrast. CONTRAST:  174m ISOVUE-300 IOPAMIDOL (ISOVUE-300) INJECTION 61% COMPARISON:  Abdominal radiograph earlier this day. Most recent CT 05/29/2015 FINDINGS: Lower chest: Moderate bilateral pleural effusions, right greater than left. Adjacent compressive atelectasis in both lower lobes. Small pericardial effusion. Probable decubitus ulcer involving the dependent right lower posterior hamate thorax. No subjacent bony destructive change of the right ribs, however there is increased scleroses of the lateral right tenth rib. This ulcer and rib change and new from prior CT. Remote left rib fractures. Liver: No focal lesion. Hepatobiliary: Gallbladder decompressed, no calcified stone. No biliary dilatation. Pancreas: No ductal dilatation or inflammation. Spleen: Normal. Adrenal glands: No nodule. Kidneys: Left greater than right perinephric stranding, increased from prior CT. Small left renal cyst. Symmetric renal excretion. No definite striated nephrogram is seen. Stomach/Bowel: Enteric tube within the stomach. Decreased gastric distention from radiographs  earlier this day. There is enteric contrast within the distal esophagus, stomach, and proximal small bowel. No gastric wall thickening or external mass effect to suggest gastric outlet obstruction. No small bowel dilatation. Descending colostomy with small to moderate stool burden. Long Hartmann's pouch with retained stool again seen. There is rectal wall thickening. Vascular/Lymphatic: Multiple retroperitoneal lymph nodes measuring up to 8 mm short axis, unchanged from prior exam. Enlarged external iliac lymph nodes bilaterally, also not significantly changed. Tortuous abdominal aorta. There is a retro aortic left renal vein. Reproductive: Prostate gland normal in size. Bladder: Supra-pubic catheter in place. Mild wall thickening appears chronic. Other: No free air or intra-abdominal fluid collection. Musculoskeletal: Chronic sacral decubitus ulcer extending to bone. Chronic osteomyelitis of both hips with decubitus ulcer on the right. Chronic multifocal pelvic sclerosis and sequela of chronic osteomyelitis. IMPRESSION: 1. No evidence of bowel obstruction. Decreased gastric distention with enteric tube in place. No findings to suggest gastric outlet or small bowel obstruction. 2. Probable new decubitus ulcer involving the right lower posterior thorax with scleroses of the right posterior tenth rib, new from exam 3 months prior, and concerning for rib osteomyelitis. Sacral decubitus ulcer with chronic osteomyelitis of the pelvis and both hips appears unchanged from prior. 3. Bilateral pleural effusions and compressive atelectasis, increased from prior. Trace pericardial effusion. 4. Increased left perinephric stranding from prior, urinary tract infection not excluded. 5. Additional chronic findings as described. Electronically Signed   By: MJeb LeveringM.D.   On: 08/18/2015 01:30   Ir Fluoro Guide Cv Line Right  08/17/2015  CLINICAL DATA:  Bacteremia, needs IV access for infusion therapy EXAM: PICC PLACEMENT  WITH ULTRASOUND AND FLUOROSCOPY FLUOROSCOPY TIME:  0.1 minute, 6 uGym2 DAP TECHNIQUE: After written informed consent was obtained, patient was placed in the supine position on angiographic table. Patency of the right brachial vein was confirmed with ultrasound with image documentation. An appropriate skin site was determined. Skin site was marked. Region was prepped using maximum barrier technique including cap and mask, sterile gown, sterile gloves, large sterile sheet, and Chlorhexidine as cutaneous antisepsis. The region was infiltrated locally with 1% lidocaine. Under real-time ultrasound guidance, the right brachial vein was accessed with a 21 gauge micropuncture needle; the needle tip within the vein was confirmed with ultrasound image documentation. Needle exchanged over a  018 guidewire for a peel-away sheath, through which a 5-French double-lumen power injectable PICC trimmed to 41cm was advanced, positioned with its tip near the cavoatrial junction. Spot chest radiograph confirms appropriate catheter position. Catheter was flushed per protocol and secured externally. The patient tolerated procedure well. COMPLICATIONS: COMPLICATIONS none IMPRESSION: 1. Technically successful five [place] double lumen power injectable PICC placement Electronically Signed   By: Lucrezia Europe M.D.   On: 08/17/2015 10:28   Ir US Guide Vasc Access Right  08/17/2015  CLINICAL DATA:  Bacteremia, needs IV access for infusion therapy EXAM: PICC PLACEMENT WITH ULTRASOUND AND FLUOROSCOPY FLUOROSCOPY TIME:  0.1 minute, 6 uGym2 DAP TECHNIQUE: After written informed consent was obtained, patient was placed in the supine position on angiographic table. Patency of the right brachial vein was confirmed with ultrasound with image documentation. An appropriate skin site was determined. Skin site was marked. Region was prepped using maximum barrier technique including cap and mask, sterile gown, sterile gloves, large sterile sheet, and  Chlorhexidine as cutaneous antisepsis. The region was infiltrated locally with 1% lidocaine. Under real-time ultrasound guidance, the right brachial vein was accessed with a 21 gauge micropuncture needle; the needle tip within the vein was confirmed with ultrasound image documentation. Needle exchanged over a 018 guidewire for a peel-away sheath, through which a 5-French double-lumen power injectable PICC trimmed to 41cm was advanced, positioned with its tip near the cavoatrial junction. Spot chest radiograph confirms appropriate catheter position. Catheter was flushed per protocol and secured externally. The patient tolerated procedure well. COMPLICATIONS: COMPLICATIONS none IMPRESSION: 1. Technically successful five [place] double lumen power injectable PICC placement Electronically Signed   By: Lucrezia Europe M.D.   On: 08/17/2015 10:28   Dg Abd Portable 1v  08/17/2015  CLINICAL DATA:  Nausea vomiting and abdominal distention EXAM: PORTABLE ABDOMEN - 1 VIEW COMPARISON:  05/29/2015 CT scan FINDINGS: There is pronounced severe gastric distention. There is gas further into bowel. IMPRESSION: Although there is some gas beyond the stomach, there is severe gastric gaseous distention. Possibility of gastric outlet obstruction not excluded. Electronically Signed   By: Skipper Cliche M.D.   On: 08/17/2015 07:02      Assessment/Plan:  INTERVAL HISTORY:   08/17/15: Pseudomonas and Serratia growing from his rib bone cultures 08/18/15: Micro re-read slides and did NOT see GPCC but only GNR  Principal Problem:   Fever Active Problems:   Quadriplegia ([place])   Sacral decubitus ulcer, stage IV ([name]   Suprapubic catheter ([name]   Chronic osteomyelitis, pelvic region and thigh ([name]   Severe protein-calorie malnutrition ([place])   Iron deficiency anemia   Sepsis ([place])   Bacteremia   Sacral decubitus ulcer   Osteomyelitis, pelvic region and thigh ([name]   N&V (nausea and vomiting)   Sacral wound   Wound, open,  trunk   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 male with  chronic pelvic osteomyeliitis now with thoracic wound going down to rib sp I and D with Serratia growing on cultures so far  #1 Thoracic and pelvic osteomyelitis:  --patient is unlikely to be able to cure any of these areas but hopefully we can allow him to make som progress with the surgical intervention he has had plus course of parenteral antibiotics  We will broaden back out to cover the pseudomonas isolated from his rib bone  NOTE there was [facility] seen on GS but never grow  and upon subsequent review about lab did not see gram-positive cocci but only gram-negative rods on review of Gram stain  He will need 6 weeks of IV therapy  #2 Stomach distention: better after NG tube  Diagnosis: Osteomyelitis of thorax and pelvis  Culture Result: Serratia and Pseudomonas  Allergies  Allergen Reactions  . Ditropan [Oxybutynin] Other (See Comments)    hallucinations    Discharge antibiotics:  Cefepime 2 grams IV q 8 hours  Per pharmacy protocol   Duration:  6 weeks  End Date:  09/27/15  [facility] Per Protocol:  Labs weekly while on IV antibiotics: x__ CBC with differential x__ BMP x__ CRP _x_ ESR   Fax weekly labs to [phone]  Clinic Follow Up Appt:  Next 4 weeks  I will sign off for now  Please call with further questions.    LOS: 6 days   Alcide Evener 08/18/2015, 5:30 PM
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
## 2015-08-18 NOTE — Progress Notes (Addendum)
CRITICAL VALUE ALERT  Critical value received:  Calcium 6.4  Date of notification:  04/042017  Time of notification:  [id number]  Critical value read back:Yes.    Nurse who received alert:  Kathe Becton  MD notified (1st page):  Schorr  Time of first page:  0542  MD notified (2nd page):Schorr  Time of second page: 0621  Responding MD:  Schorr  Time MD responded:  [phone]

## 2015-08-18 NOTE — Progress Notes (Signed)
  No evidence of obstruction.  Signing off.  Please call with questions or concerns.   Dammon Makarewicz, ANP-BC
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
## 2015-08-19 DIAGNOSIS — M86659 Other chronic osteomyelitis, unspecified thigh: Secondary | ICD-10-CM

## 2015-08-19 LAB — COMPREHENSIVE METABOLIC PANEL
ALT: 12 U/L — ABNORMAL LOW (ref 17–63)
ANION GAP: 10 (ref 5–15)
AST: 15 U/L (ref 15–41)
Albumin: 1.7 g/dL — ABNORMAL LOW (ref 3.5–5.0)
Alkaline Phosphatase: 73 U/L (ref 38–126)
BILIRUBIN TOTAL: 0.4 mg/dL (ref 0.3–1.2)
CHLORIDE: 105 mmol/L (ref 101–111)
CO2: 28 mmol/L (ref 22–32)
Calcium: 8.3 mg/dL — ABNORMAL LOW (ref 8.9–10.3)
Creatinine, Ser: <0.3 mg/dL — ABNORMAL LOW (ref 0.61–1.24)
Glucose, Bld: 92 mg/dL (ref 65–99)
POTASSIUM: 2.5 mmol/L — AB (ref 3.5–5.1)
Sodium: 143 mmol/L (ref 135–145)
TOTAL PROTEIN: 6.1 g/dL — AB (ref 6.5–8.1)

## 2015-08-19 LAB — CBC
HEMATOCRIT: 25.9 % — AB (ref 39.0–52.0)
Hemoglobin: 8.3 g/dL — ABNORMAL LOW (ref 13.0–17.0)
MCH: 25.5 pg — AB (ref 26.0–34.0)
MCHC: 32 g/dL (ref 30.0–36.0)
MCV: 79.7 fL (ref 78.0–100.0)
PLATELETS: 511 10*3/uL — AB (ref 150–400)
RBC: 3.25 MIL/uL — ABNORMAL LOW (ref 4.22–5.81)
RDW: 16.4 % — AB (ref 11.5–15.5)
WBC: 12.5 10*3/uL — AB (ref 4.0–10.5)

## 2015-08-19 LAB — MAGNESIUM: MAGNESIUM: 1.8 mg/dL (ref 1.7–2.4)

## 2015-08-19 MED ORDER — POTASSIUM CHLORIDE IN NACL 40-0.9 MEQ/L-% IV SOLN
INTRAVENOUS | Status: DC
  Administered 2015-08-19 – 2015-08-25 (×9): 75 mL/h via INTRAVENOUS
  Filled 2015-08-19 (×14): qty 1000

## 2015-08-19 MED ORDER — SODIUM CHLORIDE 0.9 % IV SOLN
INTRAVENOUS | Status: DC
  Filled 2015-08-19 (×2): qty 1000

## 2015-08-19 MED ORDER — POTASSIUM CHLORIDE 10 MEQ/100ML IV SOLN
10.0000 meq | INTRAVENOUS | Status: AC
  Administered 2015-08-19 (×4): 10 meq via INTRAVENOUS
  Filled 2015-08-19: qty 100

## 2015-08-19 MED ORDER — METOCLOPRAMIDE HCL 5 MG/ML IJ SOLN
5.0000 mg | Freq: Four times a day (QID) | INTRAMUSCULAR | Status: DC
  Administered 2015-08-19 – 2015-08-20 (×3): 5 mg via INTRAVENOUS
  Filled 2015-08-19 (×3): qty 2

## 2015-08-19 NOTE — Progress Notes (Addendum)
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 Hospital Course prolonged due to development of ? partial small bowel obstruction.    Assessment/Plan: Sepsis Now resolved. Suspect etiology to be right rib wound status post debridement growing Serratia and Pseudomonas. Based on sensitivity ID recommend 6 weeks of IV cefepime. (Stop date 09/27/2015) Remains afebrile. Follow-up with wound care nurse. Wound VAC change per nurse.  ? Partial small bowel obstruction Have significant gastric distention on 4/3. CT abdomen and pelvis does not show any signs of bowel obstruction. NG tube placed in with good response but still has significant gastric output (almost 2 L for the past 24 hours). Seen by surgery and signed off. Monitor output.add IV Reglan.  Chronic thoracic pelvic osteomyelitis No definite surgical treatment plan. He was seen by [name] at [facility] and offered hemipelvectomy which patient declined. Continue empiric antibiotics for now. Consult [name] as needed.  Polymicrobial UTI  Possibly contamination from his suprapubic catheter   Acute on chronic anemia Possibly of chronic disease and? Chronic blood loss from sacral wounds. Received any PRBC on 4/2. Monitor H&H.  Hypokalemia Replenish with IV fluids and iv kcl. Check mg  C5 quadriplegia  DVT Prophylaxis: SCDs Diet: Nothing by mouth  Code Status: Full code Family Communication: at bedside Disposition Plan: Pending improvement in his abdominal distention.   Consultants:  ID  Plastic surgery  Surgery (signed off)  Procedures:  Right rib  biopsy  Antibiotics:  IV cefepime until 5/14  HPI/Subjective: Seen and examined. Denies abdominal pain still has significant gastric output. Afebrile  Objective: Filed Vitals:   08/18/15 2050 08/19/15 0555  BP: 117/71 124/69  Pulse: 71 70  Temp: 98.4 F (36.9 C) 98.2 F (36.8 C)  Resp: 20 20    Intake/Output Summary (Last 24 hours) at 08/19/15 1705 Last data filed at 08/19/15 1630  Gross per 24 hour  Intake    977 ml  Output   7551 ml  Net  -6574 ml   Filed Weights   08/11/15 2120 08/12/15 0339 08/12/15 1340  Weight: 89.359 kg (197 lb) 90.719 kg (200 lb) 92.08 kg (203 lb)    Exam:   General:  Middle aged quadriplegic male not in distress,   HEENT: NG with bleeding output, moist mucosa  Chest: Clear bilaterally  Cardiovascular: Normal S1 and S2, no murmurs rub or gallop  GI: Soft, colostomy with some stool, suprapubic catheter,   Musculoskeletal: Ulcer with wound VAC  CNS: Quadriplegic  Data Reviewed: Basic Metabolic Panel:  Recent Labs Lab 08/15/15 0615 08/15/15 0900 08/17/15 2018 08/18/15 0448 08/19/15 0443  NA 141  --  142 141 143  K 5.2* 4.6 2.6* 4.4 2.5*  CL 112*  --  106 106 105  CO2 21*  --  26 26 28   GLUCOSE 101*  --  112* 86 92  BUN 8  --  7 6 <5*  CREATININE 0.51*  --  0.40* 0.35* <0.30*  CALCIUM 8.0*  --  8.1* 6.4* 8.3*   Liver Function Tests:  Recent Labs Lab 08/17/15 2018 08/18/15 0448 08/19/15 0443  AST 13* 15 15  ALT 14* 12* 12*  ALKPHOS 70 69 73  BILITOT 0.5 0.5 0.4  PROT 5.8* 5.9* 6.1*  ALBUMIN 1.7* 1.6* 1.7*   No results for input(s): LIPASE, AMYLASE in the last 168 hours. No results for input(s): AMMONIA in the last 168 hours. CBC:  Recent Labs Lab 08/15/15 0615 08/16/15 1641 08/18/15 0448 08/19/15 0443  WBC 14.1* 13.0* 16.9* 12.5*  NEUTROABS 8.9*  --   --   --   HGB 7.1* 6.9* 7.9* 8.3*  HCT 23.3* 22.2* 24.9* 25.9*  MCV 79.0 79.0 79.3 79.7  PLT 438* 435* 487* 511*   Cardiac Enzymes: No results for  input(s): CKTOTAL, CKMB, CKMBINDEX, TROPONINI in the last 168 hours. BNP (last 3 results) No results for input(s): BNP in the last 8760 hours.  ProBNP (last 3 results) No results for input(s): PROBNP in the last 8760 hours.  CBG: No results for input(s): GLUCAP in the last 168 hours.  Recent Results (from the past 240 hour(s))  Culture, blood (Routine X 2) w Reflex to ID Panel     Status: None   Collection Time: 08/11/15 10:23 PM  Result Value Ref Range Status   Specimen Description BLOOD LEFT HAND  Final   Special Requests IN PEDIATRIC BOTTLE 1ML  Final   Culture  Setup Time   Final    GRAM POSITIVE COCCI IN CLUSTERS AEROBIC BOTTLE ONLY CRITICAL RESULT CALLED TO, READ BACK BY AND VERIFIED WITH: T IRBY,RN @0203  08/13/15 MKELLY    Culture   Final    STAPHYLOCOCCUS SPECIES (COAGULASE NEGATIVE) THE SIGNIFICANCE OF ISOLATING THIS ORGANISM FROM A SINGLE SET OF BLOOD CULTURES WHEN MULTIPLE SETS ARE DRAWN IS UNCERTAIN. PLEASE NOTIFY THE MICROBIOLOGY DEPARTMENT WITHIN ONE WEEK IF SPECIATION AND SENSITIVITIES ARE REQUIRED.    Report Status 08/15/2015 FINAL  Final  Culture, blood (Routine X 2) w Reflex to ID Panel     Status: None   Collection Time: 08/12/15  4:15 AM  Result Value Ref Range Status   Specimen Description BLOOD RIGHT ANTECUBITAL  Final   Special Requests IN PEDIATRIC BOTTLE 3CC  Final   Culture NO GROWTH 5 DAYS  Final   Report Status 08/17/2015 FINAL  Final  Urine culture     Status: None   Collection Time: 08/12/15 10:33 AM  Result Value Ref Range Status   Specimen Description URINE, SUPRAPUBIC  Final   Special Requests NONE  Final   Culture   Final    60,000 COLONIES/ml ESCHERICHIA COLI 20,000 COLONIES/mL PROVIDENCIA STUARTII 20,000 COLONIES/mL PSEUDOMONAS AERUGINOSA MULTI-DRUG RESISTANT ORGANISM RESULT CALLED TO, READ BACK BY AND VERIFIED WITH: T CROSS,RN AT 1030 08/16/15 BY L BENFIELD 30,000 COLONIES/mL PROTEUS MIRABILIS    Report Status 08/16/2015 FINAL  Final    Organism ID, Bacteria ESCHERICHIA COLI  Final   Organism ID, Bacteria PROVIDENCIA STUARTII  Final   Organism ID, Bacteria PROTEUS MIRABILIS  Final   Organism ID, Bacteria PSEUDOMONAS AERUGINOSA  Final      Susceptibility   Escherichia coli - MIC*    AMPICILLIN <=2 SENSITIVE Sensitive     CEFAZOLIN <=4 SENSITIVE Sensitive     CEFTRIAXONE <=1 SENSITIVE Sensitive     CIPROFLOXACIN >=4 RESISTANT Resistant     GENTAMICIN <=1 SENSITIVE Sensitive     IMIPENEM <=0.25 SENSITIVE Sensitive     NITROFURANTOIN <=16 SENSITIVE Sensitive     TRIMETH/SULFA <=20 SENSITIVE Sensitive     AMPICILLIN/SULBACTAM <=2 SENSITIVE Sensitive     PIP/TAZO <=4 SENSITIVE Sensitive     * 60,000 COLONIES/ml ESCHERICHIA COLI   Proteus mirabilis - MIC*    AMPICILLIN <=2 SENSITIVE Sensitive  CEFAZOLIN <=4 SENSITIVE Sensitive     CEFTRIAXONE <=1 SENSITIVE Sensitive     CIPROFLOXACIN <=0.25 SENSITIVE Sensitive     GENTAMICIN <=1 SENSITIVE Sensitive     IMIPENEM 4 SENSITIVE Sensitive     NITROFURANTOIN 128 RESISTANT Resistant     TRIMETH/SULFA <=20 SENSITIVE Sensitive     AMPICILLIN/SULBACTAM <=2 SENSITIVE Sensitive     PIP/TAZO <=4 SENSITIVE Sensitive     * 30,000 COLONIES/mL PROTEUS MIRABILIS   Pseudomonas aeruginosa - MIC*    CEFTAZIDIME 16 INTERMEDIATE Intermediate     CIPROFLOXACIN >=4 RESISTANT Resistant     GENTAMICIN 8 INTERMEDIATE Intermediate     IMIPENEM <=0.25 SENSITIVE Sensitive     CEFEPIME 16 INTERMEDIATE Intermediate     * 20,000 COLONIES/mL PSEUDOMONAS AERUGINOSA   Providencia stuartii - MIC*    AMPICILLIN >=32 RESISTANT Resistant     CEFAZOLIN >=64 RESISTANT Resistant     CEFTRIAXONE <=1 SENSITIVE Sensitive     CIPROFLOXACIN >=4 RESISTANT Resistant     GENTAMICIN >=16 RESISTANT Resistant     IMIPENEM 2 SENSITIVE Sensitive     NITROFURANTOIN 128 RESISTANT Resistant     TRIMETH/SULFA >=320 RESISTANT Resistant     AMPICILLIN/SULBACTAM >=32 RESISTANT Resistant     PIP/TAZO <=4 SENSITIVE  Sensitive     * 20,000 COLONIES/mL PROVIDENCIA STUARTII  MRSA PCR Screening     Status: None   Collection Time: 08/12/15  2:52 PM  Result Value Ref Range Status   MRSA by PCR NEGATIVE NEGATIVE Final    Comment:        The GeneXpert MRSA Assay (FDA approved for NASAL specimens only), is one component of a comprehensive MRSA colonization surveillance program. It is not intended to diagnose MRSA infection nor to guide or monitor treatment for MRSA infections.   Anaerobic culture     Status: None   Collection Time: 08/13/15  2:20 PM  Result Value Ref Range Status   Specimen Description TISSUE  Final   Special Requests RIGHT LATERAL TRUNK  Final   Gram Stain   Final    NO WBC SEEN NO ORGANISMS SEEN Performed at [facility]    Culture   Final    NO ANAEROBES ISOLATED Performed at [facility]    Report Status 08/18/2015 FINAL  Final  Tissue culture     Status: None   Collection Time: 08/13/15  2:20 PM  Result Value Ref Range Status   Specimen Description TISSUE  Final   Special Requests RIGHT LATERAL TRUNK  Final   Gram Stain   Final    NO WBC SEEN NO ORGANISMS SEEN Performed at [facility]    Culture   Final    MODERATE SERRATIA MARCESCENS Performed at [facility]    Report Status 08/16/2015 FINAL  Final   Organism ID, Bacteria SERRATIA MARCESCENS  Final      Susceptibility   Serratia marcescens - MIC*    CEFAZOLIN >=64 RESISTANT Resistant     CEFEPIME <=1 SENSITIVE Sensitive     CEFTAZIDIME <=1 SENSITIVE Sensitive     CEFTRIAXONE <=1 SENSITIVE Sensitive     CIPROFLOXACIN <=0.25 SENSITIVE Sensitive     GENTAMICIN <=1 SENSITIVE Sensitive     TOBRAMYCIN 8 INTERMEDIATE Intermediate     TRIMETH/SULFA Value in next row Sensitive      <=20 SENSITIVE(NOTE)    * MODERATE SERRATIA MARCESCENS  Anaerobic culture     Status: None   Collection Time: 08/13/15  2:26 PM  Result  Value Ref Range Status   Specimen Description BONE  Final    Special Requests BONE FROM RIGHT RIB  Final   Gram Stain   Final    NO WBC SEEN MODERATE GRAM POSITIVE COCCI IN PAIRS Performed at [facility]    Culture   Final    FEW BACTEROIDES FRAGILIS Note: BETA LACTAMASE POSITIVE Performed at [facility]    Report Status 08/18/2015 FINAL  Final  Tissue culture     Status: None   Collection Time: 08/13/15  2:26 PM  Result Value Ref Range Status   Specimen Description BONE  Final   Special Requests BONE FROM RIGHT RIB  Final   Gram Stain   Final    NO WBC SEEN MODERATE GRAM POSITIVE COCCI IN PAIRS    Culture   Final    ABUNDANT PSEUDOMONAS AERUGINOSA ABUNDANT SERRATIA MARCESCENS    Report Status 08/17/2015 FINAL  Final   Organism ID, Bacteria PSEUDOMONAS AERUGINOSA  Final   Organism ID, Bacteria SERRATIA MARCESCENS  Final      Susceptibility   Pseudomonas aeruginosa - MIC*    CEFEPIME 2 SENSITIVE Sensitive     CEFTAZIDIME 4 SENSITIVE Sensitive     CIPROFLOXACIN 2 INTERMEDIATE Intermediate     GENTAMICIN <=1 SENSITIVE Sensitive     IMIPENEM 1 SENSITIVE Sensitive     PIP/TAZO 8 SENSITIVE Sensitive     TOBRAMYCIN Value in next row Sensitive      <=1 SENSITIVEPerformed at [place]   Serratia marcescens - MIC*    CEFAZOLIN Value in next row Resistant      <=1 SENSITIVEPerformed at [facility]    CEFEPIME Value in next row Sensitive      <=1 SENSITIVEPerformed at [facility]    CEFTAZIDIME Value in next row Sensitive      <=1 SENSITIVEPerformed at [facility]    CEFTRIAXONE Value in next row Sensitive      <=1 SENSITIVEPerformed at [facility]    CIPROFLOXACIN Value in next row Sensitive      <=1 SENSITIVEPerformed at [place] Value in next row Sensitive      <=1 SENSITIVEPerformed at [facility]    TOBRAMYCIN Value in next row Intermediate      <=1 SENSITIVEPerformed at [facility]     TRIMETH/SULFA Value in next row Sensitive      <=20 SENSITIVE(NOTE)    * ABUNDANT SERRATIA MARCESCENS     Studies: Ct Abdomen Pelvis W Contrast  08/18/2015  CLINICAL DATA:  Small bowel obstruction. EXAM: CT ABDOMEN AND PELVIS WITH CONTRAST TECHNIQUE: Multidetector CT imaging of the abdomen and pelvis was performed using the standard protocol following bolus administration of intravenous contrast. CONTRAST:  196mL ISOVUE-300 IOPAMIDOL (ISOVUE-300) INJECTION 61% COMPARISON:  Abdominal radiograph earlier this day. Most recent CT 05/29/2015 FINDINGS: Lower chest: Moderate bilateral pleural effusions, right greater than left. Adjacent compressive atelectasis in both lower lobes. Small pericardial effusion. Probable decubitus ulcer involving the dependent right lower posterior hamate thorax. No subjacent bony destructive change of the right ribs, however there is increased scleroses of the lateral right tenth rib. This ulcer and rib change and new from prior CT. Remote left rib fractures. Liver: No focal lesion. Hepatobiliary: Gallbladder decompressed, no calcified stone. No biliary dilatation. Pancreas: No ductal dilatation or inflammation. Spleen: Normal. Adrenal glands: No nodule. Kidneys: Left greater than right perinephric stranding,  increased from prior CT. Small left renal cyst. Symmetric renal excretion. No definite striated nephrogram is seen. Stomach/Bowel: Enteric tube within the stomach. Decreased gastric distention from radiographs earlier this day. There is enteric contrast within the distal esophagus, stomach, and proximal small bowel. No gastric wall thickening or external mass effect to suggest gastric outlet obstruction. No small bowel dilatation. Descending colostomy with small to moderate stool burden. Long Hartmann's pouch with retained stool again seen. There is rectal wall thickening. Vascular/Lymphatic: Multiple retroperitoneal lymph nodes measuring up to 8 mm short axis, unchanged from  prior exam. Enlarged external iliac lymph nodes bilaterally, also not significantly changed. Tortuous abdominal aorta. There is a retro aortic left renal vein. Reproductive: Prostate gland normal in size. Bladder: Supra-pubic catheter in place. Mild wall thickening appears chronic. Other: No free air or intra-abdominal fluid collection. Musculoskeletal: Chronic sacral decubitus ulcer extending to bone. Chronic osteomyelitis of both hips with decubitus ulcer on the right. Chronic multifocal pelvic sclerosis and sequela of chronic osteomyelitis. IMPRESSION: 1. No evidence of bowel obstruction. Decreased gastric distention with enteric tube in place. No findings to suggest gastric outlet or small bowel obstruction. 2. Probable new decubitus ulcer involving the right lower posterior thorax with scleroses of the right posterior tenth rib, new from exam 3 months prior, and concerning for rib osteomyelitis. Sacral decubitus ulcer with chronic osteomyelitis of the pelvis and both hips appears unchanged from prior. 3. Bilateral pleural effusions and compressive atelectasis, increased from prior. Trace pericardial effusion. 4. Increased left perinephric stranding from prior, urinary tract infection not excluded. 5. Additional chronic findings as described. Electronically Signed   By: Jeb Levering M.D.   On: 08/18/2015 01:30    Scheduled Meds: . sodium chloride   Intravenous Once  . antiseptic oral rinse  7 mL Mouth Rinse BID  . baclofen  20 mg Oral QID  . ceFEPime (MAXIPIME) IV  2 g Intravenous 3 times per day  . collagenase   Topical Daily  . darifenacin  15 mg Oral Daily  . diclofenac sodium  2 g Topical QID  . ferrous sulfate  325 mg Oral TID WC  . lactose free nutrition  237 mL Oral TID BM  . magnesium oxide  400 mg Oral Daily  . metoCLOPramide  10 mg Oral TID AC  . multivitamin with minerals  1 tablet Oral Daily  . nutrition supplement (JUVEN)  1 packet Oral BID BM  . pantoprazole  40 mg Oral BID AC   . sucralfate  1 g Oral QID   Continuous Infusions: . dextrose 5 % and 0.9% NaCl 75 mL/hr at 08/19/15 0544      Time spent: 25 minutes    Dedric Ethington, [place]  [facility] Pager [phone]. If 7PM-7AM, please contact night-coverage at [email or web address], password [facility] 08/19/2015, 5:05 PM  LOS: 7 days

## 2015-08-19 NOTE — Progress Notes (Signed)
K+ 2.5 called to on call.. Schoor

## 2015-08-19 NOTE — Progress Notes (Signed)
Pt doesn't want to wear CPAP due to having a NG tube in place.  Pt has CPAP in room.  RT will continue to monitor.

## 2015-08-19 NOTE — Progress Notes (Signed)
Pt refused CPAP qhs.  Pt states he is unable to tolerate his CPAP due to his NG tube.  RT will continue to monitor as needed.

## 2015-08-20 DIAGNOSIS — K567 Ileus, unspecified: Secondary | ICD-10-CM

## 2015-08-20 LAB — BASIC METABOLIC PANEL
Anion gap: 15 (ref 5–15)
CALCIUM: 8.2 mg/dL — AB (ref 8.9–10.3)
CO2: 26 mmol/L (ref 22–32)
CREATININE: 0.42 mg/dL — AB (ref 0.61–1.24)
Chloride: 102 mmol/L (ref 101–111)
GFR calc Af Amer: >60 mL/min (ref >60)
GLUCOSE: 88 mg/dL (ref 65–99)
Potassium: 2.9 mmol/L — ABNORMAL LOW (ref 3.5–5.1)
Sodium: 143 mmol/L (ref 135–145)

## 2015-08-20 MED ORDER — METOCLOPRAMIDE HCL 5 MG/ML IJ SOLN
10.0000 mg | Freq: Four times a day (QID) | INTRAMUSCULAR | Status: DC
  Administered 2015-08-20 – 2015-08-24 (×16): 10 mg via INTRAVENOUS
  Filled 2015-08-20 (×15): qty 2

## 2015-08-20 MED ORDER — MAGNESIUM SULFATE 2 GM/50ML IV SOLN
2.0000 g | Freq: Once | INTRAVENOUS | Status: AC
  Administered 2015-08-20: 2 g via INTRAVENOUS
  Filled 2015-08-20: qty 50

## 2015-08-20 NOTE — Progress Notes (Signed)
7 Days Post-Op  Subjective: The patient is one week postoperative from excision of the chest wound on the right.  He is doing very well and shows remarkable signs of improvement.  The rib now has granulation tissue.  Objective: Vital signs in last 24 hours: Temp:  [98.6 F (37 C)-98.8 F (37.1 C)] 98.6 F (37 C) (04/06 0950) Pulse Rate:  [57-75] 75 (04/06 0950) Resp:  [16-19] 16 (04/06 0950) BP: (113-123)/(72-73) 118/73 mmHg (04/06 0950) SpO2:  [98 %-100 %] 100 % (04/06 0950) Last BM Date: 08/20/15  Intake/Output from previous day: 04/05 0701 - 04/06 0700 In: 1507.5 [I.V.:807.5; IV Piggyback:700] Out: [id number] [Urine:2800; Emesis/NG output:2500; Drains:25] Intake/Output this shift: Total I/O In: -  Out: 1100 [Urine:375; Emesis/NG output:725]  General appearance: alert, cooperative and no distress Incision/Wound:  Lab Results:   Recent Labs  08/18/15 0448 08/19/15 0443  WBC 16.9* 12.5*  HGB 7.9* 8.3*  HCT 24.9* 25.9*  PLT 487* 511*   BMET  Recent Labs  08/19/15 0443 08/20/15 0942  NA 143 143  K 2.5* 2.9*  CL 105 102  CO2 28 26  GLUCOSE 92 88  BUN <5* <5*  CREATININE <0.30* 0.42*  CALCIUM 8.3* 8.2*   PT/INR No results for input(s): LABPROT, INR in the last 72 hours. ABG No results for input(s): PHART, HCO3 in the last 72 hours.  Invalid input(s): PCO2, PO2  Studies/Results: No results found.  Anti-infectives: Anti-infectives    Start     Dose/Rate Route Frequency Ordered Stop   08/17/15 1400  ceFEPIme (MAXIPIME) 2 g in dextrose 5 % 50 mL IVPB     2 g 100 mL/hr over 30 Minutes Intravenous 3 times per day 08/17/15 1053     08/16/15 2000  cefTRIAXone (ROCEPHIN) 2 g in dextrose 5 % 50 mL IVPB  Status:  Discontinued     2 g 100 mL/hr over 30 Minutes Intravenous Every 24 hours 08/16/15 1846 08/17/15 1052   08/15/15 1800  ceFEPIme (MAXIPIME) 2 g in dextrose 5 % 50 mL IVPB  Status:  Discontinued     2 g 100 mL/hr over 30 Minutes Intravenous Every 12 hours  08/15/15 1443 08/16/15 1846   08/15/15 1800  vancomycin (VANCOCIN) 1,250 mg in sodium chloride 0.9 % 250 mL IVPB  Status:  Discontinued     1,250 mg 166.7 mL/hr over 90 Minutes Intravenous Every 12 hours 08/15/15 1449 08/16/15 1846   08/14/15 1500  cefTRIAXone (ROCEPHIN) 1 g in dextrose 5 % 50 mL IVPB  Status:  Discontinued     1 g 100 mL/hr over 30 Minutes Intravenous Every 24 hours 08/14/15 1339 08/15/15 1438   08/13/15 1451  polymyxin B 500,000 Units, bacitracin 50,000 Units in sodium chloride irrigation 0.9 % 500 mL irrigation  Status:  Discontinued       As needed 08/13/15 1452 08/13/15 1512   08/13/15 1345  vancomycin (VANCOCIN) IVPB 1000 mg/200 mL premix     1,000 mg 200 mL/hr over 60 Minutes Intravenous To Surgery 08/13/15 1334 08/13/15 1500   08/13/15 1345  piperacillin-tazobactam (ZOSYN) IVPB 3.375 g  Status:  Discontinued     3.375 g 12.5 mL/hr over 240 Minutes Intravenous To Surgery 08/13/15 1334 08/13/15 1639   08/11/15 2145  piperacillin-tazobactam (ZOSYN) IVPB 3.375 g  Status:  Discontinued     3.375 g 100 mL/hr over 30 Minutes Intravenous  Once 08/11/15 2136 08/12/15 0020   08/11/15 2145  vancomycin (VANCOCIN) IVPB 1000 mg/200 mL premix  Status:  Discontinued     1,000 mg 200 mL/hr over 60 Minutes Intravenous  Once 08/11/15 2136 08/12/15 0020      Assessment/Plan: s/p Procedure(s) with comments: IRRIGATION AND DEBRIDEMENT WOUND RIGHT LATERAL TORSO (Right) DRESSING CHANGE UNDER ANESTHESIA (N/A) - SACRUM Can be discharge with the Home VAC for 1 -2 times a week dressing changes.  LOS: 8 days    Wallace Going 08/20/2015

## 2015-08-20 NOTE — Progress Notes (Signed)
Patient refusing CPAP tonight. Patient has NG in place and he states it will be uncomfortable. RT will continue to monitor and made pt aware that if he changed his mind to call

## 2015-08-20 NOTE — Progress Notes (Signed)
[facility] PROGRESS NOTE  [name] [name] [id number] DOB: 1965-12-10 DOA: 08/11/2015 PCP: [name], MD  Brief narrative 50 year old quadriplegic male ( following C-spine fracture) with chronic nonhealing sacral wounds, chronic pelvic osteomyelitis, chronic decubitus ulcer recurrent sepsis presented with malaise with concern for sepsis. 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 Hospital Course prolonged due to development of ? partial small bowel obstruction.    Assessment/Plan: Sepsis Now resolved. Suspect etiology to be right rib wound status post debridement growing Serratia and Pseudomonas. Based on sensitivity ID recommend 6 weeks of IV cefepime. (Stop date 09/27/2015) Remains afebrile. Follow-up with wound care nurse. Wound VAC change per nurse.  Gastric ileus Have significant gastric distention on 4/3. CT abdomen and pelvis does not show any signs of bowel obstruction. NG tube placed in with good response but still has significant gastric output . No stool output reported but patient does have some colostomy output. Seen by surgery and signed off.  Increased Reglan to 10 mg every 6 hours. Patient was seen by a [name] previously for similar issue in 04/2014. Asked them  to reevaluate.   Chronic thoracic pelvic osteomyelitis No definite surgical treatment plan. He was seen by [name] at [facility] and offered hemipelvectomy which patient declined. Continue empiric antibiotics for now. Spoke with Dr.Dillingham. That recommends that the wound will be evaluated once supplies are available today. Pt had excisional debridement of the right trunk wound with Acell placed, which can be changed once a week .  Polymicrobial UTI  Possibly contamination from his suprapubic catheter   Acute on chronic anemia Possibly of chronic disease and? Chronic blood loss from sacral wounds. Received any PRBC on 4/2. Monitor H&H.  Hypokalemia Replenish with IV fluids and iv kcl.  Replenished low magnesium.  C5 quadriplegia  DVT Prophylaxis: SCDs Diet: Nothing by mouth  Code Status: Full code Family Communication: at bedside Disposition Plan: Pending improvement in his abdominal distention.   Consultants:  ID  Plastic surgery  Surgery (signed off)  [name] consulted  Procedures:  Right rib biopsy  Antibiotics:  IV cefepime until 5/14  HPI/Subjective: Seen and examined. No Abdominal distention but still has significant gastric output.  Objective: Filed Vitals:   08/20/15 0525 08/20/15 0950  BP: 113/72 118/73  Pulse: 73 75  Temp: 98.6 F (37 C) 98.6 F (37 C)  Resp: 18 16    Intake/Output Summary (Last 24 hours) at 08/20/15 1220 Last data filed at 08/20/15 1115  Gross per 24 hour  Intake 1507.5 ml  Output   5425 ml  Net -3917.5 ml   Filed Weights   08/11/15 2120 08/12/15 0339 08/12/15 1340  Weight: 89.359 kg (197 lb) 90.719 kg (200 lb) 92.08 kg (203 lb)    Exam:   General:  Middle aged quadriplegic male not in distress,   HEENT: NG with biliary output, moist mucosa  Chest: Clear bilaterally  Cardiovascular: Normal S1 and S2, no murmurs rub or gallop  GI: Soft, colostomy with some stool, suprapubic catheter,   Musculoskeletal: Right-upper back wound vac,  chronic pelvic ulcer  CNS: Quadriplegic  Data Reviewed: Basic Metabolic Panel:  Recent Labs Lab 08/15/15 0615 08/15/15 0900 08/17/15 2018 08/18/15 0448 08/19/15 0443 08/19/15 1651 08/20/15 0942  NA 141  --  142 141 143  --  143  K 5.2* 4.6 2.6* 4.4 2.5*  --  2.9*  CL 112*  --  106 106 105  --  102  CO2 21*  --  26 26  28  --  26  GLUCOSE 101*  --  112* 86 92  --  88  BUN 8  --  7 6 <5*  --  <5*  CREATININE 0.51*  --  0.40* 0.35* <0.30*  --  0.42*  CALCIUM 8.0*  --  8.1* 6.4* 8.3*  --  8.2*  MG  --   --   --   --   --  1.8  --    Liver Function Tests:  Recent Labs Lab 08/17/15 2018 08/18/15 0448 08/19/15 0443  AST 13* 15 15  ALT 14* 12* 12*   ALKPHOS 70 69 73  BILITOT 0.5 0.5 0.4  PROT 5.8* 5.9* 6.1*  ALBUMIN 1.7* 1.6* 1.7*   No results for input(s): LIPASE, AMYLASE in the last 168 hours. No results for input(s): AMMONIA in the last 168 hours. CBC:  Recent Labs Lab 08/15/15 0615 08/16/15 1641 08/18/15 0448 08/19/15 0443  WBC 14.1* 13.0* 16.9* 12.5*  NEUTROABS 8.9*  --   --   --   HGB 7.1* 6.9* 7.9* 8.3*  HCT 23.3* 22.2* 24.9* 25.9*  MCV 79.0 79.0 79.3 79.7  PLT 438* 435* 487* 511*   Cardiac Enzymes: No results for input(s): CKTOTAL, CKMB, CKMBINDEX, TROPONINI in the last 168 hours. BNP (last 3 results) No results for input(s): BNP in the last 8760 hours.  ProBNP (last 3 results) No results for input(s): PROBNP in the last 8760 hours.  CBG: No results for input(s): GLUCAP in the last 168 hours.  Recent Results (from the past 240 hour(s))  Culture, blood (Routine X 2) w Reflex to ID Panel     Status: None   Collection Time: 08/11/15 10:23 PM  Result Value Ref Range Status   Specimen Description BLOOD LEFT HAND  Final   Special Requests IN PEDIATRIC BOTTLE 1ML  Final   Culture  Setup Time   Final    GRAM POSITIVE COCCI IN CLUSTERS AEROBIC BOTTLE ONLY CRITICAL RESULT CALLED TO, READ BACK BY AND VERIFIED WITH: T IRBY,RN @0203  08/13/15 MKELLY    Culture   Final    STAPHYLOCOCCUS SPECIES (COAGULASE NEGATIVE) THE SIGNIFICANCE OF ISOLATING THIS ORGANISM FROM A SINGLE SET OF BLOOD CULTURES WHEN MULTIPLE SETS ARE DRAWN IS UNCERTAIN. PLEASE NOTIFY THE MICROBIOLOGY DEPARTMENT WITHIN ONE WEEK IF SPECIATION AND SENSITIVITIES ARE REQUIRED.    Report Status 08/15/2015 FINAL  Final  Culture, blood (Routine X 2) w Reflex to ID Panel     Status: None   Collection Time: 08/12/15  4:15 AM  Result Value Ref Range Status   Specimen Description BLOOD RIGHT ANTECUBITAL  Final   Special Requests IN PEDIATRIC BOTTLE 3CC  Final   Culture NO GROWTH 5 DAYS  Final   Report Status 08/17/2015 FINAL  Final  Urine culture      Status: None   Collection Time: 08/12/15 10:33 AM  Result Value Ref Range Status   Specimen Description URINE, SUPRAPUBIC  Final   Special Requests NONE  Final   Culture   Final    60,000 COLONIES/ml ESCHERICHIA COLI 20,000 COLONIES/mL PROVIDENCIA STUARTII 20,000 COLONIES/mL PSEUDOMONAS AERUGINOSA MULTI-DRUG RESISTANT ORGANISM RESULT CALLED TO, READ BACK BY AND VERIFIED WITH: T CROSS,RN AT 1030 08/16/15 BY L BENFIELD 30,000 COLONIES/mL PROTEUS MIRABILIS    Report Status 08/16/2015 FINAL  Final   Organism ID, Bacteria ESCHERICHIA COLI  Final   Organism ID, Bacteria PROVIDENCIA STUARTII  Final   Organism ID, Bacteria PROTEUS MIRABILIS  Final   Organism ID, Bacteria  PSEUDOMONAS AERUGINOSA  Final      Susceptibility   Escherichia coli - MIC*    AMPICILLIN <=2 SENSITIVE Sensitive     CEFAZOLIN <=4 SENSITIVE Sensitive     CEFTRIAXONE <=1 SENSITIVE Sensitive     CIPROFLOXACIN >=4 RESISTANT Resistant     GENTAMICIN <=1 SENSITIVE Sensitive     IMIPENEM <=0.25 SENSITIVE Sensitive     NITROFURANTOIN <=16 SENSITIVE Sensitive     TRIMETH/SULFA <=20 SENSITIVE Sensitive     AMPICILLIN/SULBACTAM <=2 SENSITIVE Sensitive     PIP/TAZO <=4 SENSITIVE Sensitive     * 60,000 COLONIES/ml ESCHERICHIA COLI   Proteus mirabilis - MIC*    AMPICILLIN <=2 SENSITIVE Sensitive     CEFAZOLIN <=4 SENSITIVE Sensitive     CEFTRIAXONE <=1 SENSITIVE Sensitive     CIPROFLOXACIN <=0.25 SENSITIVE Sensitive     GENTAMICIN <=1 SENSITIVE Sensitive     IMIPENEM 4 SENSITIVE Sensitive     NITROFURANTOIN 128 RESISTANT Resistant     TRIMETH/SULFA <=20 SENSITIVE Sensitive     AMPICILLIN/SULBACTAM <=2 SENSITIVE Sensitive     PIP/TAZO <=4 SENSITIVE Sensitive     * 30,000 COLONIES/mL PROTEUS MIRABILIS   Pseudomonas aeruginosa - MIC*    CEFTAZIDIME 16 INTERMEDIATE Intermediate     CIPROFLOXACIN >=4 RESISTANT Resistant     GENTAMICIN 8 INTERMEDIATE Intermediate     IMIPENEM <=0.25 SENSITIVE Sensitive     CEFEPIME 16  INTERMEDIATE Intermediate     * 20,000 COLONIES/mL PSEUDOMONAS AERUGINOSA   Providencia stuartii - MIC*    AMPICILLIN >=32 RESISTANT Resistant     CEFAZOLIN >=64 RESISTANT Resistant     CEFTRIAXONE <=1 SENSITIVE Sensitive     CIPROFLOXACIN >=4 RESISTANT Resistant     GENTAMICIN >=16 RESISTANT Resistant     IMIPENEM 2 SENSITIVE Sensitive     NITROFURANTOIN 128 RESISTANT Resistant     TRIMETH/SULFA >=320 RESISTANT Resistant     AMPICILLIN/SULBACTAM >=32 RESISTANT Resistant     PIP/TAZO <=4 SENSITIVE Sensitive     * 20,000 COLONIES/mL PROVIDENCIA STUARTII  MRSA PCR Screening     Status: None   Collection Time: 08/12/15  2:52 PM  Result Value Ref Range Status   MRSA by PCR NEGATIVE NEGATIVE Final    Comment:        The GeneXpert MRSA Assay (FDA approved for NASAL specimens only), is one component of a comprehensive MRSA colonization surveillance program. It is not intended to diagnose MRSA infection nor to guide or monitor treatment for MRSA infections.   Anaerobic culture     Status: None   Collection Time: 08/13/15  2:20 PM  Result Value Ref Range Status   Specimen Description TISSUE  Final   Special Requests RIGHT LATERAL TRUNK  Final   Gram Stain   Final    NO WBC SEEN NO ORGANISMS SEEN Performed at [facility]    Culture   Final    NO ANAEROBES ISOLATED Performed at [facility]    Report Status 08/18/2015 FINAL  Final  Tissue culture     Status: None   Collection Time: 08/13/15  2:20 PM  Result Value Ref Range Status   Specimen Description TISSUE  Final   Special Requests RIGHT LATERAL TRUNK  Final   Gram Stain   Final    NO WBC SEEN NO ORGANISMS SEEN Performed at [facility]    Culture   Final    MODERATE SERRATIA MARCESCENS Performed at [facility]    Report Status 08/16/2015 FINAL  Final   Organism ID, Bacteria SERRATIA MARCESCENS  Final      Susceptibility   Serratia marcescens - MIC*    CEFAZOLIN >=64 RESISTANT  Resistant     CEFEPIME <=1 SENSITIVE Sensitive     CEFTAZIDIME <=1 SENSITIVE Sensitive     CEFTRIAXONE <=1 SENSITIVE Sensitive     CIPROFLOXACIN <=0.25 SENSITIVE Sensitive     GENTAMICIN <=1 SENSITIVE Sensitive     TOBRAMYCIN 8 INTERMEDIATE Intermediate     TRIMETH/SULFA Value in next row Sensitive      <=20 SENSITIVE(NOTE)    * MODERATE SERRATIA MARCESCENS  Anaerobic culture     Status: None   Collection Time: 08/13/15  2:26 PM  Result Value Ref Range Status   Specimen Description BONE  Final   Special Requests BONE FROM RIGHT RIB  Final   Gram Stain   Final    NO WBC SEEN MODERATE GRAM POSITIVE COCCI IN PAIRS Performed at [facility]    Culture   Final    FEW BACTEROIDES FRAGILIS Note: BETA LACTAMASE POSITIVE Performed at [facility]    Report Status 08/18/2015 FINAL  Final  Tissue culture     Status: None   Collection Time: 08/13/15  2:26 PM  Result Value Ref Range Status   Specimen Description BONE  Final   Special Requests BONE FROM RIGHT RIB  Final   Gram Stain   Final    NO WBC SEEN MODERATE GRAM POSITIVE COCCI IN PAIRS    Culture   Final    ABUNDANT PSEUDOMONAS AERUGINOSA ABUNDANT SERRATIA MARCESCENS    Report Status 08/17/2015 FINAL  Final   Organism ID, Bacteria PSEUDOMONAS AERUGINOSA  Final   Organism ID, Bacteria SERRATIA MARCESCENS  Final      Susceptibility   Pseudomonas aeruginosa - MIC*    CEFEPIME 2 SENSITIVE Sensitive     CEFTAZIDIME 4 SENSITIVE Sensitive     CIPROFLOXACIN 2 INTERMEDIATE Intermediate     GENTAMICIN <=1 SENSITIVE Sensitive     IMIPENEM 1 SENSITIVE Sensitive     PIP/TAZO 8 SENSITIVE Sensitive     TOBRAMYCIN Value in next row Sensitive      <=1 SENSITIVEPerformed at [facility]    * ABUNDANT PSEUDOMONAS AERUGINOSA   Serratia marcescens - MIC*    CEFAZOLIN Value in next row Resistant      <=1 SENSITIVEPerformed at [facility]    CEFEPIME Value in next row Sensitive      <=1 SENSITIVEPerformed  at [facility]    CEFTAZIDIME Value in next row Sensitive      <=1 SENSITIVEPerformed at [facility]    CEFTRIAXONE Value in next row Sensitive      <=1 SENSITIVEPerformed at [facility]    CIPROFLOXACIN Value in next row Sensitive      <=1 SENSITIVEPerformed at [facility] Value in next row Sensitive      <=1 SENSITIVEPerformed at [facility]    TOBRAMYCIN Value in next row Intermediate      <=1 SENSITIVEPerformed at [facility]    TRIMETH/SULFA Value in next row Sensitive      <=20 SENSITIVE(NOTE)    * ABUNDANT SERRATIA MARCESCENS     Studies: No results found.  Scheduled Meds: . sodium chloride   Intravenous Once  . antiseptic oral rinse  7 mL Mouth Rinse BID  . baclofen  20 mg Oral QID  . ceFEPime (MAXIPIME) IV  2  g Intravenous 3 times per day  . collagenase   Topical Daily  . darifenacin  15 mg Oral Daily  . diclofenac sodium  2 g Topical QID  . ferrous sulfate  325 mg Oral TID WC  . lactose free nutrition  237 mL Oral TID BM  . magnesium oxide  400 mg Oral Daily  . metoCLOPramide (REGLAN) injection  10 mg Intravenous 4 times per day  . multivitamin with minerals  1 tablet Oral Daily  . nutrition supplement (JUVEN)  1 packet Oral BID BM  . pantoprazole  40 mg Oral BID AC  . sucralfate  1 g Oral QID   Continuous Infusions: . 0.9 % NaCl with KCl 40 mEq / L 75 mL/hr (08/20/15 0819)      Time spent: 25 minutes    Dalis Beers, [place]  [facility] Pager [phone]. If 7PM-7AM, please contact night-coverage at [email or web address], password [facility] 08/20/2015, 12:20 PM  LOS: 8 days

## 2015-08-20 NOTE — Procedures (Signed)
Operative Note   DATE OF OPERATION: 08/20/2015  LOCATION: [facility]  SURGICAL DIVISION: Plastic Surgery  PREOPERATIVE DIAGNOSES:  Right posterior trunk wound 10 x 10 x 1.5 cm  POSTOPERATIVE DIAGNOSES:  same  PROCEDURE:  Preparation of right posterior trunk / chest wound for Acell (1 gm) and VAC placement  SURGEON: Claire Sanger Dillingham, DO  ASSISTANT: Shawn Rayburn, PA  ANESTHESIA:  General.   COMPLICATIONS: None.   INDICATIONS FOR PROCEDURE:  The patient, [name] [name] is a 50 y.o. male born on Apr 19, 1966, is here for treatment of a new right posterior chest/trunk wound.  He underwent debridement and Acell / VAC placement one week ago.  He shows remarkable improvement with granulation tissue. MRN: [id number]  CONSENT:  Informed consent was obtained directly from the patient. Risks, benefits and alternatives were fully discussed. Specific risks including but not limited to bleeding, infection, hematoma, seroma, scarring, pain, infection, contracture, asymmetry, wound healing problems, and need for further surgery were all discussed. The patient did have an ample opportunity to have questions answered to satisfaction.   DESCRIPTION OF PROCEDURE:  The patient was in his hospital room.  SCDs were in place. The VAC was removed.  The Acell was incorporated.  A time out was performed and all information was confirmed to be correct.  The Acell powder was applied (1 gm) and the sorbact placed.  The VAC was reapplied.  There was an excellent seal.   The patient tolerated the procedure well.  There were no complications.
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
## 2015-08-21 DIAGNOSIS — E876 Hypokalemia: Secondary | ICD-10-CM

## 2015-08-21 LAB — BASIC METABOLIC PANEL
ANION GAP: 17 — AB (ref 5–15)
BUN: <5 mg/dL — ABNORMAL LOW (ref 6–20)
CHLORIDE: 105 mmol/L (ref 101–111)
CO2: 21 mmol/L — ABNORMAL LOW (ref 22–32)
CREATININE: 0.61 mg/dL (ref 0.61–1.24)
Calcium: 8.4 mg/dL — ABNORMAL LOW (ref 8.9–10.3)
GFR calc non Af Amer: >60 mL/min (ref >60)
Glucose, Bld: 64 mg/dL — ABNORMAL LOW (ref 65–99)
Potassium: 3.4 mmol/L — ABNORMAL LOW (ref 3.5–5.1)
SODIUM: 143 mmol/L (ref 135–145)

## 2015-08-21 LAB — MAGNESIUM: MAGNESIUM: 2.1 mg/dL (ref 1.7–2.4)

## 2015-08-21 NOTE — Progress Notes (Signed)
[facility] PROGRESS NOTE  KATAI ALCALA [id number] DOB: [birth year] DOA: 08/11/2015 PCP: [name], MD  Brief narrative 50 year old quadriplegic male ( following C-spine fracture) with chronic nonhealing sacral wounds, chronic pelvic osteomyelitis, chronic decubitus ulcer recurrent sepsis presented with malaise with concern for sepsis. 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 Hospital Course prolonged due to development of ? partial small bowel obstruction.    Assessment/Plan: Sepsis Now resolved. Suspect etiology to be right rib wound status post debridement growing Serratia and Pseudomonas. Based on sensitivity ID recommend 6 weeks of IV cefepime. (Stop date 09/27/2015) Remains afebrile. Follow-up with wound care nurse. Wound VAC change per nurse.  Gastric ileus  significant gastric distention on 4/3. CT abdomen and pelvis does not show any signs of bowel obstruction. NG tube placed in with good response but having significant gastric output . Has small colostomy output. Surgery have signed off. Increased Reglan to 10 mg every 6 hours. Gastric output has started to improve. Seen by [facility] who recommends to continue NG and high dose Reglan. If not improved he may need repeat EGD to evaluate cause of his gastric outlet obstruction.  Chronic thoracic pelvic osteomyelitis No definite surgical treatment plan. He was seen by [name] at [facility] and offered hemipelvectomy which patient declined. On empiric antibiotics. Seen by [name] on 4/6. Wound appears good. Acell and vac placed over the wound.  Recommends patient can be discharged home with a VAC requesting change 1-2 times a week.   Polymicrobial UTI  Possibly contamination from his suprapubic catheter   Acute on chronic anemia Possibly of chronic disease and? Chronic blood loss from sacral wounds. Received  PRBC on 4/2. Monitor H&H.  Hypokalemia Replenish with IV fluids and iv kcl. Replenished  low magnesium.  C5 quadriplegia  DVT Prophylaxis: SCDs Diet: Nothing by mouth  Code Status: Full code Family Communication: at bedside Disposition Plan: Pending improvement in his abdominal distention.   Consultants:  ID  Plastic surgery  Surgery (signed off)  [name] consulted  Procedures:  Right rib biopsy with wound excision  Antibiotics:  IV cefepime until 5/14  HPI/Subjective: Seen and examined. Abdominal distention. Gastric output improving.  Objective: Filed Vitals:   08/20/15 2150 08/21/15 0609  BP: 111/62 135/17  Pulse: 68 68  Temp: 98.2 F (36.8 C) 96.8 F (36 C)  Resp: 19 18    Intake/Output Summary (Last 24 hours) at 08/21/15 1331 Last data filed at 08/21/15 1303  Gross per 24 hour  Intake 24457.17 ml  Output   2000 ml  Net 22457.17 ml   Filed Weights   08/11/15 2120 08/12/15 0339 08/12/15 1340  Weight: 89.359 kg (197 lb) 90.719 kg (200 lb) 92.08 kg (203 lb)    Exam:   General:  Middle aged quadriplegic male not in distress,   HEENT: NG with biliary output, moist mucosa  Chest: Clear bilaterally  Cardiovascular: Normal S1 and S2, no murmurs rub or gallop  GI: Soft, colostomy with some stool, suprapubic catheter, Bowel sounds present  Musculoskeletal: Right-upper back wound vac,  chronic pelvic ulcer    Data Reviewed: Basic Metabolic Panel:  Recent Labs Lab 08/17/15 2018 08/18/15 0448 08/19/15 0443 08/19/15 1651 08/20/15 0942 08/21/15 0515  NA 142 141 143  --  143 143  K 2.6* 4.4 2.5*  --  2.9* 3.4*  CL 106 106 105  --  102 105  CO2 26 26 28   --  26 21*  GLUCOSE 112* 86 92  --  88 64*  BUN 7 6 <5*  --  <5* <5*  CREATININE 0.40* 0.35* <0.30*  --  0.42* 0.61  CALCIUM 8.1* 6.4* 8.3*  --  8.2* 8.4*  MG  --   --   --  1.8  --  2.1   Liver Function Tests:  Recent Labs Lab 08/17/15 2018 08/18/15 0448 08/19/15 0443  AST 13* 15 15  ALT 14* 12* 12*  ALKPHOS 70 69 73  BILITOT 0.5 0.5 0.4  PROT 5.8* 5.9* 6.1*   ALBUMIN 1.7* 1.6* 1.7*   No results for input(s): LIPASE, AMYLASE in the last 168 hours. No results for input(s): AMMONIA in the last 168 hours. CBC:  Recent Labs Lab 08/15/15 0615 08/16/15 1641 08/18/15 0448 08/19/15 0443  WBC 14.1* 13.0* 16.9* 12.5*  NEUTROABS 8.9*  --   --   --   HGB 7.1* 6.9* 7.9* 8.3*  HCT 23.3* 22.2* 24.9* 25.9*  MCV 79.0 79.0 79.3 79.7  PLT 438* 435* 487* 511*   Cardiac Enzymes: No results for input(s): CKTOTAL, CKMB, CKMBINDEX, TROPONINI in the last 168 hours. BNP (last 3 results) No results for input(s): BNP in the last 8760 hours.  ProBNP (last 3 results) No results for input(s): PROBNP in the last 8760 hours.  CBG: No results for input(s): GLUCAP in the last 168 hours.  Recent Results (from the past 240 hour(s))  Culture, blood (Routine X 2) w Reflex to ID Panel     Status: None   Collection Time: 08/11/15 10:23 PM  Result Value Ref Range Status   Specimen Description BLOOD LEFT HAND  Final   Special Requests IN PEDIATRIC BOTTLE 1ML  Final   Culture  Setup Time   Final    GRAM POSITIVE COCCI IN CLUSTERS AEROBIC BOTTLE ONLY CRITICAL RESULT CALLED TO, READ BACK BY AND VERIFIED WITH: T IRBY,RN @0203  08/13/15 MKELLY    Culture   Final    STAPHYLOCOCCUS SPECIES (COAGULASE NEGATIVE) THE SIGNIFICANCE OF ISOLATING THIS ORGANISM FROM A SINGLE SET OF BLOOD CULTURES WHEN MULTIPLE SETS ARE DRAWN IS UNCERTAIN. PLEASE NOTIFY THE MICROBIOLOGY DEPARTMENT WITHIN ONE WEEK IF SPECIATION AND SENSITIVITIES ARE REQUIRED.    Report Status 08/15/2015 FINAL  Final  Culture, blood (Routine X 2) w Reflex to ID Panel     Status: None   Collection Time: 08/12/15  4:15 AM  Result Value Ref Range Status   Specimen Description BLOOD RIGHT ANTECUBITAL  Final   Special Requests IN PEDIATRIC BOTTLE 3CC  Final   Culture NO GROWTH 5 DAYS  Final   Report Status 08/17/2015 FINAL  Final  Urine culture     Status: None   Collection Time: 08/12/15 10:33 AM  Result Value Ref  Range Status   Specimen Description URINE, SUPRAPUBIC  Final   Special Requests NONE  Final   Culture   Final    60,000 COLONIES/ml ESCHERICHIA COLI 20,000 COLONIES/mL PROVIDENCIA STUARTII 20,000 COLONIES/mL PSEUDOMONAS AERUGINOSA MULTI-DRUG RESISTANT ORGANISM RESULT CALLED TO, READ BACK BY AND VERIFIED WITH: T CROSS,RN AT 1030 08/16/15 BY L BENFIELD 30,000 COLONIES/mL PROTEUS MIRABILIS    Report Status 08/16/2015 FINAL  Final   Organism ID, Bacteria ESCHERICHIA COLI  Final   Organism ID, Bacteria PROVIDENCIA STUARTII  Final   Organism ID, Bacteria PROTEUS MIRABILIS  Final   Organism ID, Bacteria PSEUDOMONAS AERUGINOSA  Final      Susceptibility   Escherichia coli - MIC*    AMPICILLIN <=2 SENSITIVE Sensitive     CEFAZOLIN <=4 SENSITIVE  Sensitive     CEFTRIAXONE <=1 SENSITIVE Sensitive     CIPROFLOXACIN >=4 RESISTANT Resistant     GENTAMICIN <=1 SENSITIVE Sensitive     IMIPENEM <=0.25 SENSITIVE Sensitive     NITROFURANTOIN <=16 SENSITIVE Sensitive     TRIMETH/SULFA <=20 SENSITIVE Sensitive     AMPICILLIN/SULBACTAM <=2 SENSITIVE Sensitive     PIP/TAZO <=4 SENSITIVE Sensitive     * 60,000 COLONIES/ml ESCHERICHIA COLI   Proteus mirabilis - MIC*    AMPICILLIN <=2 SENSITIVE Sensitive     CEFAZOLIN <=4 SENSITIVE Sensitive     CEFTRIAXONE <=1 SENSITIVE Sensitive     CIPROFLOXACIN <=0.25 SENSITIVE Sensitive     GENTAMICIN <=1 SENSITIVE Sensitive     IMIPENEM 4 SENSITIVE Sensitive     NITROFURANTOIN 128 RESISTANT Resistant     TRIMETH/SULFA <=20 SENSITIVE Sensitive     AMPICILLIN/SULBACTAM <=2 SENSITIVE Sensitive     PIP/TAZO <=4 SENSITIVE Sensitive     * 30,000 COLONIES/mL PROTEUS MIRABILIS   Pseudomonas aeruginosa - MIC*    CEFTAZIDIME 16 INTERMEDIATE Intermediate     CIPROFLOXACIN >=4 RESISTANT Resistant     GENTAMICIN 8 INTERMEDIATE Intermediate     IMIPENEM <=0.25 SENSITIVE Sensitive     CEFEPIME 16 INTERMEDIATE Intermediate     * 20,000 COLONIES/mL PSEUDOMONAS AERUGINOSA    Providencia stuartii - MIC*    AMPICILLIN >=32 RESISTANT Resistant     CEFAZOLIN >=64 RESISTANT Resistant     CEFTRIAXONE <=1 SENSITIVE Sensitive     CIPROFLOXACIN >=4 RESISTANT Resistant     GENTAMICIN >=16 RESISTANT Resistant     IMIPENEM 2 SENSITIVE Sensitive     NITROFURANTOIN 128 RESISTANT Resistant     TRIMETH/SULFA >=320 RESISTANT Resistant     AMPICILLIN/SULBACTAM >=32 RESISTANT Resistant     PIP/TAZO <=4 SENSITIVE Sensitive     * 20,000 COLONIES/mL PROVIDENCIA STUARTII  MRSA PCR Screening     Status: None   Collection Time: 08/12/15  2:52 PM  Result Value Ref Range Status   MRSA by PCR NEGATIVE NEGATIVE Final    Comment:        The GeneXpert MRSA Assay (FDA approved for NASAL specimens only), is one component of a comprehensive MRSA colonization surveillance program. It is not intended to diagnose MRSA infection nor to guide or monitor treatment for MRSA infections.   Anaerobic culture     Status: None   Collection Time: 08/13/15  2:20 PM  Result Value Ref Range Status   Specimen Description TISSUE  Final   Special Requests RIGHT LATERAL TRUNK  Final   Gram Stain   Final    NO WBC SEEN NO ORGANISMS SEEN Performed at [facility]    Culture   Final    NO ANAEROBES ISOLATED Performed at [facility]    Report Status 08/18/2015 FINAL  Final  Tissue culture     Status: None   Collection Time: 08/13/15  2:20 PM  Result Value Ref Range Status   Specimen Description TISSUE  Final   Special Requests RIGHT LATERAL TRUNK  Final   Gram Stain   Final    NO WBC SEEN NO ORGANISMS SEEN Performed at [facility]    Culture   Final    MODERATE SERRATIA MARCESCENS Performed at [facility]    Report Status 08/16/2015 FINAL  Final   Organism ID, Bacteria SERRATIA MARCESCENS  Final      Susceptibility   Serratia marcescens - MIC*    CEFAZOLIN >=64 RESISTANT Resistant  CEFEPIME <=1 SENSITIVE Sensitive     CEFTAZIDIME <=1 SENSITIVE  Sensitive     CEFTRIAXONE <=1 SENSITIVE Sensitive     CIPROFLOXACIN <=0.25 SENSITIVE Sensitive     GENTAMICIN <=1 SENSITIVE Sensitive     TOBRAMYCIN 8 INTERMEDIATE Intermediate     TRIMETH/SULFA Value in next row Sensitive      <=20 SENSITIVE(NOTE)    * MODERATE SERRATIA MARCESCENS  Anaerobic culture     Status: None   Collection Time: 08/13/15  2:26 PM  Result Value Ref Range Status   Specimen Description BONE  Final   Special Requests BONE FROM RIGHT RIB  Final   Gram Stain   Final    NO WBC SEEN MODERATE GRAM POSITIVE COCCI IN PAIRS Performed at [facility]    Culture   Final    FEW BACTEROIDES FRAGILIS Note: BETA LACTAMASE POSITIVE Performed at [facility]    Report Status 08/18/2015 FINAL  Final  Tissue culture     Status: None   Collection Time: 08/13/15  2:26 PM  Result Value Ref Range Status   Specimen Description BONE  Final   Special Requests BONE FROM RIGHT RIB  Final   Gram Stain   Final    NO WBC SEEN MODERATE GRAM POSITIVE COCCI IN PAIRS    Culture   Final    ABUNDANT PSEUDOMONAS AERUGINOSA ABUNDANT SERRATIA MARCESCENS    Report Status 08/17/2015 FINAL  Final   Organism ID, Bacteria PSEUDOMONAS AERUGINOSA  Final   Organism ID, Bacteria SERRATIA MARCESCENS  Final      Susceptibility   Pseudomonas aeruginosa - MIC*    CEFEPIME 2 SENSITIVE Sensitive     CEFTAZIDIME 4 SENSITIVE Sensitive     CIPROFLOXACIN 2 INTERMEDIATE Intermediate     GENTAMICIN <=1 SENSITIVE Sensitive     IMIPENEM 1 SENSITIVE Sensitive     PIP/TAZO 8 SENSITIVE Sensitive     TOBRAMYCIN Value in next row Sensitive      <=1 SENSITIVEPerformed at [place]   Serratia marcescens - MIC*    CEFAZOLIN Value in next row Resistant      <=1 SENSITIVEPerformed at [facility]    CEFEPIME Value in next row Sensitive      <=1 SENSITIVEPerformed at [facility]    CEFTAZIDIME Value in next row Sensitive      <=1  SENSITIVEPerformed at [facility]    CEFTRIAXONE Value in next row Sensitive      <=1 SENSITIVEPerformed at [facility]    CIPROFLOXACIN Value in next row Sensitive      <=1 SENSITIVEPerformed at [facility] Value in next row Sensitive      <=1 SENSITIVEPerformed at [facility]    TOBRAMYCIN Value in next row Intermediate      <=1 SENSITIVEPerformed at [facility]    TRIMETH/SULFA Value in next row Sensitive      <=20 SENSITIVE(NOTE)    * ABUNDANT SERRATIA MARCESCENS     Studies: No results found.  Scheduled Meds: . sodium chloride   Intravenous Once  . antiseptic oral rinse  7 mL Mouth Rinse BID  . baclofen  20 mg Oral QID  . ceFEPime (MAXIPIME) IV  2 g Intravenous 3 times per day  . collagenase   Topical Daily  . darifenacin  15 mg Oral Daily  . diclofenac sodium  2 g Topical QID  . ferrous  sulfate  325 mg Oral TID WC  . lactose free nutrition  237 mL Oral TID BM  . magnesium oxide  400 mg Oral Daily  . metoCLOPramide (REGLAN) injection  10 mg Intravenous 4 times per day  . multivitamin with minerals  1 tablet Oral Daily  . nutrition supplement (JUVEN)  1 packet Oral BID BM  . pantoprazole  40 mg Oral BID AC  . sucralfate  1 g Oral QID   Continuous Infusions: . 0.9 % NaCl with KCl 40 mEq / L 75 mL/hr (08/20/15 2310)      Time spent: 25 minutes    Micajah Dennin, [place]  [facility] Pager [phone]. If 7PM-7AM, please contact night-coverage at [email or web address], password [facility] 08/21/2015, 1:31 PM  LOS: 9 days

## 2015-08-21 NOTE — Progress Notes (Signed)
[name] [name] 2:13 PM  Subjective: Patient says is doing much better and requests sips of clear liquids which we discussed and patient's case discussed with my partner Dr. Penelope Coop and his hospital computer chart was reviewed and patient familiar to me from previous endoscopies  Objective: Vital signs stable afebrile no acute distress abdomen is soft nontender no succussion splash decreased but occasional bowel sounds labs reviewed CT reviewed  Assessment: Probable gastric ileus from multiple medical problems currently improved  Plan: We will allow sips of clear liquids but continue NG suction but if he continues to improve me clamp NG and try tray of clear liquids to see how he does and would utilize antireflux measures and will last my partner to round on this weekend and might need a endoscopy next week  [facility] E  Pager [phone] After 5PM or if no answer call [phone]

## 2015-08-21 NOTE — Progress Notes (Signed)
Patient has a NG tube and does not want to wear CPAP tonight.

## 2015-08-22 LAB — BASIC METABOLIC PANEL
Anion gap: 17 — ABNORMAL HIGH (ref 5–15)
BUN: <5 mg/dL — ABNORMAL LOW (ref 6–20)
CALCIUM: 8.7 mg/dL — AB (ref 8.9–10.3)
CO2: 21 mmol/L — ABNORMAL LOW (ref 22–32)
CREATININE: 0.7 mg/dL (ref 0.61–1.24)
Chloride: 108 mmol/L (ref 101–111)
GFR calc non Af Amer: >60 mL/min (ref >60)
Glucose, Bld: 74 mg/dL (ref 65–99)
Potassium: 3.5 mmol/L (ref 3.5–5.1)
SODIUM: 146 mmol/L — AB (ref 135–145)

## 2015-08-22 MED ORDER — ADULT MULTIVITAMIN LIQUID CH
15.0000 mL | Freq: Every day | ORAL | Status: DC
  Administered 2015-08-22 – 2015-08-25 (×4): 15 mL
  Filled 2015-08-22 (×4): qty 15

## 2015-08-22 MED ORDER — PANTOPRAZOLE SODIUM 40 MG PO PACK
40.0000 mg | PACK | Freq: Two times a day (BID) | ORAL | Status: DC
  Administered 2015-08-22 – 2015-08-25 (×7): 40 mg
  Filled 2015-08-22 (×9): qty 20

## 2015-08-22 MED ORDER — FERROUS SULFATE 300 (60 FE) MG/5ML PO SYRP
300.0000 mg | ORAL_SOLUTION | Freq: Three times a day (TID) | ORAL | Status: DC
  Administered 2015-08-22 – 2015-08-25 (×10): 300 mg
  Filled 2015-08-22 (×15): qty 5

## 2015-08-22 MED ORDER — SUCRALFATE 1 GM/10ML PO SUSP
1.0000 g | Freq: Three times a day (TID) | ORAL | Status: DC
  Administered 2015-08-23 – 2015-08-25 (×10): 1 g via ORAL
  Filled 2015-08-22 (×9): qty 10

## 2015-08-22 NOTE — Progress Notes (Signed)
[facility] PROGRESS NOTE  [name] [name] [id number] DOB: 1966/01/04 DOA: 08/11/2015 PCP: [name], MD  Brief narrative 50 year old quadriplegic male ( following C-spine fracture) with chronic nonhealing sacral wounds, chronic pelvic osteomyelitis, chronic decubitus ulcer recurrent sepsis presented with malaise with concern for sepsis. 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 Hospital Course prolonged due to development of ? partial small bowel obstruction.    Assessment/Plan: Sepsis Now resolved. Suspect etiology to be right rib wound status post debridement growing Serratia and Pseudomonas. Based on sensitivity ID recommend 6 weeks of IV cefepime. (Stop date 09/27/2015) Remains afebrile.   Gastric ileus  significant gastric distention on 4/3. CT abdomen and pelvis does not show any signs of bowel obstruction. NG tube placed in with good response but having significant gastric output . Has small colostomy output. Surgery have signed off. Increased Reglan to 10 mg every 6 hours.  Seen by [facility] who recommends to continue NG and high dose Reglan. Recommend to clamp the 2 for several hours and monitor. If not improved he may need repeat EGD to evaluate cause of his gastric outlet obstruction.  Chronic thoracic pelvic osteomyelitis No definite surgical treatment plan. He was seen by [name] at [facility] and offered hemipelvectomy which patient declined. On empiric antibiotics. Seen by [name] on 4/6. Wound appears good. Acell and vac placed over the wound.  Recommends patient can be discharged home with a VAC requesting change 1-2 times a week.   Polymicrobial UTI  Possibly contamination from his suprapubic catheter   Acute on chronic anemia Possibly of chronic disease and? Chronic blood loss from sacral wounds. Received  PRBC on 4/2. Monitor H&H.  Hypokalemia Replenish with IV fluids and iv kcl. Replenished low magnesium.  C5 quadriplegia  DVT  Prophylaxis: SCDs Diet: Nothing by mouth  Code Status: Full code Family Communication: at bedside Disposition Plan: Pending improvement in his abdominal distention.   Consultants:  ID  Plastic surgery  Surgery (signed off)  [name] consulted  Procedures:  Right rib biopsy with wound excision  Antibiotics:  IV cefepime until 5/14  HPI/Subjective: Seen and examined. Allow to have clear liquid with NG in place. Still has good gastric output  Objective: Filed Vitals:   08/21/15 2308 08/22/15 0501  BP: 128/72 113/82  Pulse: 85 95  Temp: 98.3 F (36.8 C) 98.4 F (36.9 C)  Resp: 18 18    Intake/Output Summary (Last 24 hours) at 08/22/15 1346 Last data filed at 08/22/15 1338  Gross per 24 hour  Intake 833.75 ml  Output   3175 ml  Net -2341.25 ml   Filed Weights   08/11/15 2120 08/12/15 0339 08/12/15 1340  Weight: 89.359 kg (197 lb) 90.719 kg (200 lb) 92.08 kg (203 lb)    Exam:   General:  Middle aged quadriplegic male not in distress,   HEENT: NG with biliary output, moist mucosa  Chest: Clear bilaterally  Cardiovascular: Normal S1 and S2, no murmurs rub or gallop  GI: Soft, colostomy with some stool, suprapubic catheter, Bowel sounds present  Musculoskeletal: Right-upper back wound vac,  chronic pelvic ulcer    Data Reviewed: Basic Metabolic Panel:  Recent Labs Lab 08/18/15 0448 08/19/15 0443 08/19/15 1651 08/20/15 0942 08/21/15 0515 08/22/15 0424  NA 141 143  --  143 143 146*  K 4.4 2.5*  --  2.9* 3.4* 3.5  CL 106 105  --  102 105 108  CO2 26 28  --  26 21* 21*  GLUCOSE  86 92  --  88 64* 74  BUN 6 <5*  --  <5* <5* <5*  CREATININE 0.35* <0.30*  --  0.42* 0.61 0.70  CALCIUM 6.4* 8.3*  --  8.2* 8.4* 8.7*  MG  --   --  1.8  --  2.1  --    Liver Function Tests:  Recent Labs Lab 08/17/15 2018 08/18/15 0448 08/19/15 0443  AST 13* 15 15  ALT 14* 12* 12*  ALKPHOS 70 69 73  BILITOT 0.5 0.5 0.4  PROT 5.8* 5.9* 6.1*  ALBUMIN 1.7*  1.6* 1.7*   No results for input(s): LIPASE, AMYLASE in the last 168 hours. No results for input(s): AMMONIA in the last 168 hours. CBC:  Recent Labs Lab 08/16/15 1641 08/18/15 0448 08/19/15 0443  WBC 13.0* 16.9* 12.5*  HGB 6.9* 7.9* 8.3*  HCT 22.2* 24.9* 25.9*  MCV 79.0 79.3 79.7  PLT 435* 487* 511*   Cardiac Enzymes: No results for input(s): CKTOTAL, CKMB, CKMBINDEX, TROPONINI in the last 168 hours. BNP (last 3 results) No results for input(s): BNP in the last 8760 hours.  ProBNP (last 3 results) No results for input(s): PROBNP in the last 8760 hours.  CBG: No results for input(s): GLUCAP in the last 168 hours.  Recent Results (from the past 240 hour(s))  MRSA PCR Screening     Status: None   Collection Time: 08/12/15  2:52 PM  Result Value Ref Range Status   MRSA by PCR NEGATIVE NEGATIVE Final    Comment:        The GeneXpert MRSA Assay (FDA approved for NASAL specimens only), is one component of a comprehensive MRSA colonization surveillance program. It is not intended to diagnose MRSA infection nor to guide or monitor treatment for MRSA infections.   Anaerobic culture     Status: None   Collection Time: 08/13/15  2:20 PM  Result Value Ref Range Status   Specimen Description TISSUE  Final   Special Requests RIGHT LATERAL TRUNK  Final   Gram Stain   Final    NO WBC SEEN NO ORGANISMS SEEN Performed at [facility]    Culture   Final    NO ANAEROBES ISOLATED Performed at [facility]    Report Status 08/18/2015 FINAL  Final  Tissue culture     Status: None   Collection Time: 08/13/15  2:20 PM  Result Value Ref Range Status   Specimen Description TISSUE  Final   Special Requests RIGHT LATERAL TRUNK  Final   Gram Stain   Final    NO WBC SEEN NO ORGANISMS SEEN Performed at [facility]    Culture   Final    MODERATE SERRATIA MARCESCENS Performed at [facility]    Report Status 08/16/2015 FINAL  Final   Organism  ID, Bacteria SERRATIA MARCESCENS  Final      Susceptibility   Serratia marcescens - MIC*    CEFAZOLIN >=64 RESISTANT Resistant     CEFEPIME <=1 SENSITIVE Sensitive     CEFTAZIDIME <=1 SENSITIVE Sensitive     CEFTRIAXONE <=1 SENSITIVE Sensitive     CIPROFLOXACIN <=0.25 SENSITIVE Sensitive     GENTAMICIN <=1 SENSITIVE Sensitive     TOBRAMYCIN 8 INTERMEDIATE Intermediate     TRIMETH/SULFA Value in next row Sensitive      <=20 SENSITIVE(NOTE)    * MODERATE SERRATIA MARCESCENS  Anaerobic culture     Status: None   Collection Time: 08/13/15  2:26 PM  Result  Value Ref Range Status   Specimen Description BONE  Final   Special Requests BONE FROM RIGHT RIB  Final   Gram Stain   Final    NO WBC SEEN MODERATE GRAM POSITIVE COCCI IN PAIRS Performed at [facility]    Culture   Final    FEW BACTEROIDES FRAGILIS Note: BETA LACTAMASE POSITIVE Performed at [facility]    Report Status 08/18/2015 FINAL  Final  Tissue culture     Status: None   Collection Time: 08/13/15  2:26 PM  Result Value Ref Range Status   Specimen Description BONE  Final   Special Requests BONE FROM RIGHT RIB  Final   Gram Stain   Final    NO WBC SEEN MODERATE GRAM POSITIVE COCCI IN PAIRS    Culture   Final    ABUNDANT PSEUDOMONAS AERUGINOSA ABUNDANT SERRATIA MARCESCENS    Report Status 08/17/2015 FINAL  Final   Organism ID, Bacteria PSEUDOMONAS AERUGINOSA  Final   Organism ID, Bacteria SERRATIA MARCESCENS  Final      Susceptibility   Pseudomonas aeruginosa - MIC*    CEFEPIME 2 SENSITIVE Sensitive     CEFTAZIDIME 4 SENSITIVE Sensitive     CIPROFLOXACIN 2 INTERMEDIATE Intermediate     GENTAMICIN <=1 SENSITIVE Sensitive     IMIPENEM 1 SENSITIVE Sensitive     PIP/TAZO 8 SENSITIVE Sensitive     TOBRAMYCIN Value in next row Sensitive      <=1 SENSITIVEPerformed at [place]   Serratia marcescens - MIC*    CEFAZOLIN Value in next row Resistant       <=1 SENSITIVEPerformed at [facility]    CEFEPIME Value in next row Sensitive      <=1 SENSITIVEPerformed at [facility]    CEFTAZIDIME Value in next row Sensitive      <=1 SENSITIVEPerformed at [facility]    CEFTRIAXONE Value in next row Sensitive      <=1 SENSITIVEPerformed at [facility]    CIPROFLOXACIN Value in next row Sensitive      <=1 SENSITIVEPerformed at [facility] Value in next row Sensitive      <=1 SENSITIVEPerformed at [facility]    TOBRAMYCIN Value in next row Intermediate      <=1 SENSITIVEPerformed at [facility]    TRIMETH/SULFA Value in next row Sensitive      <=20 SENSITIVE(NOTE)    * ABUNDANT SERRATIA MARCESCENS     Studies: No results found.  Scheduled Meds: . sodium chloride   Intravenous Once  . antiseptic oral rinse  7 mL Mouth Rinse BID  . baclofen  20 mg Oral QID  . ceFEPime (MAXIPIME) IV  2 g Intravenous 3 times per day  . collagenase   Topical Daily  . darifenacin  15 mg Oral Daily  . diclofenac sodium  2 g Topical QID  . ferrous sulfate  300 mg Per Tube TID WC  . lactose free nutrition  237 mL Oral TID BM  . magnesium oxide  400 mg Oral Daily  . metoCLOPramide (REGLAN) injection  10 mg Intravenous 4 times per day  . multivitamin  15 mL Per Tube Daily  . nutrition supplement (JUVEN)  1 packet Oral BID BM  . pantoprazole sodium  40 mg Per Tube BID WC  . sucralfate  1 g Oral QID   Continuous Infusions: . 0.9 % NaCl  with KCl 40 mEq / L 75 mL/hr (08/21/15 2220)      Time spent: 25 minutes    Legaci Tarman, [place]  [facility] Pager [phone]. If 7PM-7AM, please contact night-coverage at [email or web address], password [facility] 08/22/2015, 1:46 PM  LOS: 10 days

## 2015-08-22 NOTE — Progress Notes (Signed)
[facility] PROGRESS NOTE Subjective Pt still with increased NG output.  Objective: Vital signs in last 24 hours: Temp:  [98.1 F (36.7 C)-98.4 F (36.9 C)] 98.4 F (36.9 C) (04/08 0501) Pulse Rate:  [66-95] 95 (04/08 0501) Resp:  [18] 18 (04/08 0501) BP: (113-150)/(60-82) 113/82 mmHg (04/08 0501) SpO2:  [100 %] 100 % (04/08 0501) Last BM Date: 08/20/15  Intake/Output from previous day: 04/07 0701 - 04/08 0700 In: 24013.9 [P.O.:120; I.V.:18543.9; IV Piggyback:5350] Out: [id number] [Urine:2300; Emesis/NG output:2100; Drains:25] Intake/Output this shift: Total I/O In: 27 [P.O.:60] Out: -   PE: General--NG clamped  Abdomen--distended quiet  Lab Results: No results for input(s): WBC, HGB, HCT, PLT in the last 72 hours. BMET  Recent Labs  08/20/15 0942 08/21/15 0515 08/22/15 0424  NA 143 143 146*  K 2.9* 3.4* 3.5  CL 102 105 108  CO2 26 21* 21*  CREATININE 0.42* 0.61 0.70   LFT No results for input(s): PROT, AST, ALT, ALKPHOS, BILITOT, BILIDIR, IBILI in the last 72 hours. PT/INR No results for input(s): LABPROT, INR in the last 72 hours. PANCREAS No results for input(s): LIPASE in the last 72 hours.       Studies/Results: No results found.  Medications: I have reviewed the patient's current medications.  Assessment/Plan: 1. Probable Gastric Ileus. Would continue NG suction intermittantly, should be ok to clamp the tube for several hours and see how he does we will check Monday may need EGD.   Lashundra Shiveley JR,Kalayna Noy L 08/22/2015, 1:29 PM  This note was created using voice recognition software. Minor errors may Have occurred unintentionally.  Pager: [phone] If no answer or after hours call [phone]

## 2015-08-23 DIAGNOSIS — M869 Osteomyelitis, unspecified: Secondary | ICD-10-CM

## 2015-08-23 LAB — BASIC METABOLIC PANEL
Anion gap: 9 (ref 5–15)
BUN: 6 mg/dL (ref 6–20)
CHLORIDE: 107 mmol/L (ref 101–111)
CO2: 28 mmol/L (ref 22–32)
CREATININE: 0.34 mg/dL — AB (ref 0.61–1.24)
Calcium: 8.7 mg/dL — ABNORMAL LOW (ref 8.9–10.3)
GFR calc Af Amer: >60 mL/min (ref >60)
GFR calc non Af Amer: >60 mL/min (ref >60)
Glucose, Bld: 101 mg/dL — ABNORMAL HIGH (ref 65–99)
Potassium: 3.6 mmol/L (ref 3.5–5.1)
SODIUM: 144 mmol/L (ref 135–145)
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
## 2015-08-23 NOTE — Progress Notes (Signed)
[facility] PROGRESS NOTE Subjective patient did well with NG tube clamped yesterday for an extended period of time. Was put back on suction area  Objective: Vital signs in last 24 hours: Temp:  [99.1 F (37.3 C)-99.5 F (37.5 C)] 99.1 F (37.3 C) (04/09 0624) Pulse Rate:  [100-110] 100 (04/09 0624) Resp:  [19] 19 (04/09 0624) BP: (110-120)/(60-78) 110/60 mmHg (04/09 0624) SpO2:  [98 %-100 %] 98 % (04/09 0624) Last BM Date: 08/20/15  Intake/Output from previous day: 04/08 0701 - 04/09 0700 In: 3712.5 [P.O.:780; I.V.:2732.5] Out: 3625 [Urine:1650; Emesis/NG output:1825; Drains:150] Intake/Output this shift: Total I/O In: 480 [P.O.:480] Out: -   PE: General-- no acute distress listening to music  Abdomen-- nondistended nontender good bowel sounds  Lab Results: No results for input(s): WBC, HGB, HCT, PLT in the last 72 hours. BMET  Recent Labs  08/20/15 0942 08/21/15 0515 08/22/15 0424 08/23/15 0620  NA 143 143 146* 144  K 2.9* 3.4* 3.5 3.6  CL 102 105 108 107  CO2 26 21* 21* 28  CREATININE 0.42* 0.61 0.70 0.34*   LFT No results for input(s): PROT, AST, ALT, ALKPHOS, BILITOT, BILIDIR, IBILI in the last 72 hours. PT/INR No results for input(s): LABPROT, INR in the last 72 hours. PANCREAS No results for input(s): LIPASE in the last 72 hours.       Studies/Results: No results found.  Medications: I have reviewed the patient's current medications.  Assessment/Plan: 1. Gastric ileus seems to be doing much better. Will clamp NG tube and see how he does. Will not remove tube until we're sure he can tolerated being clamped.   Saniya Tranchina JR,Kessie Croston L 08/23/2015, 9:20 AM  This note was created using voice recognition software. Minor errors may Have occurred unintentionally.  Pager: [phone] If no answer or after hours call [phone]

## 2015-08-24 LAB — CBC
HEMATOCRIT: 28.6 % — AB (ref 39.0–52.0)
Hemoglobin: 8.6 g/dL — ABNORMAL LOW (ref 13.0–17.0)
MCH: 24.6 pg — ABNORMAL LOW (ref 26.0–34.0)
MCHC: 30.1 g/dL (ref 30.0–36.0)
MCV: 81.7 fL (ref 78.0–100.0)
PLATELETS: 539 10*3/uL — AB (ref 150–400)
RBC: 3.5 MIL/uL — ABNORMAL LOW (ref 4.22–5.81)
RDW: 16.7 % — AB (ref 11.5–15.5)
WBC: 14.6 10*3/uL — AB (ref 4.0–10.5)

## 2015-08-24 LAB — BASIC METABOLIC PANEL
Anion gap: 8 (ref 5–15)
BUN: <5 mg/dL — ABNORMAL LOW (ref 6–20)
CHLORIDE: 104 mmol/L (ref 101–111)
CO2: 28 mmol/L (ref 22–32)
CREATININE: 0.3 mg/dL — AB (ref 0.61–1.24)
Calcium: 8.7 mg/dL — ABNORMAL LOW (ref 8.9–10.3)
GFR calc non Af Amer: >60 mL/min (ref >60)
Glucose, Bld: 98 mg/dL (ref 65–99)
Potassium: 3.7 mmol/L (ref 3.5–5.1)
SODIUM: 140 mmol/L (ref 135–145)

## 2015-08-24 LAB — MAGNESIUM: MAGNESIUM: 1.7 mg/dL (ref 1.7–2.4)

## 2015-08-24 MED ORDER — METOCLOPRAMIDE HCL 5 MG PO TABS
5.0000 mg | ORAL_TABLET | Freq: Three times a day (TID) | ORAL | Status: DC
  Administered 2015-08-24 – 2015-08-25 (×5): 5 mg via ORAL
  Filled 2015-08-24 (×6): qty 1

## 2015-08-24 MED ORDER — METRONIDAZOLE 500 MG PO TABS
500.0000 mg | ORAL_TABLET | Freq: Three times a day (TID) | ORAL | Status: DC
  Administered 2015-08-24 – 2015-08-25 (×5): 500 mg via ORAL
  Filled 2015-08-24 (×7): qty 1
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
## 2015-08-24 NOTE — Progress Notes (Signed)
Patient ID: [name] [name], male   DOB: 05-06-1966, 50 y.o.   MRN: [id number]         [facility]    Date of Admission:  08/11/2015   Total days of antibiotics 12        Day 8 cefepime  Mr. Whalin's right rib cultures have grown Pseudomonas, Serratia and most recently Bacteroides fragilis. We will add metronidazole to his cefepime therapy for coverage of Bacteroides and other anaerobes. Please continue antibiotic therapy through 09/30/2015 when he has follow-up in our clinic with [name]. Please call if we can be of further assistance while he is here.         Michel Bickers, MD [name] [phone] pager   [phone] cell 05/19/2015, 1:32 PM

## 2015-08-24 NOTE — Progress Notes (Signed)
[facility] Progress Note  Subjective: The patient is feeling well. He states his NG tube has been clamped for a couple of days now and he denies nausea or vomiting around the tube.  Objective: Vital signs in last 24 hours: Temp:  [98.6 F (37 C)-98.8 F (37.1 C)] 98.6 F (37 C) (04/10 0531) Pulse Rate:  [99-103] 99 (04/10 0531) Resp:  [18] 18 (04/10 0531) BP: (112-135)/(69-87) 112/69 mmHg (04/10 0531) SpO2:  [96 %-98 %] 96 % (04/10 0531) Weight change:    PE:  No distress  Heart regular rhythm  Lungs clear  Abdomen soft and nontender  Lab Results: Results for orders placed or performed during the hospital encounter of 08/11/15 (from the past 24 hour(s))  Basic metabolic panel     Status: Abnormal   Collection Time: 08/24/15  4:44 AM  Result Value Ref Range   Sodium 140 135 - 145 mmol/L   Potassium 3.7 3.5 - 5.1 mmol/L   Chloride 104 101 - 111 mmol/L   CO2 28 22 - 32 mmol/L   Glucose, Bld 98 65 - 99 mg/dL   BUN <5 (L) 6 - 20 mg/dL   Creatinine, Ser 0.30 (L) 0.61 - 1.24 mg/dL   Calcium 8.7 (L) 8.9 - 10.3 mg/dL   GFR calc non Af Amer >60 >60 mL/min   GFR calc Af Amer >60 >60 mL/min   Anion gap 8 5 - 15  Magnesium     Status: None   Collection Time: 08/24/15  4:44 AM  Result Value Ref Range   Magnesium 1.7 1.7 - 2.4 mg/dL  CBC     Status: Abnormal   Collection Time: 08/24/15  4:44 AM  Result Value Ref Range   WBC 14.6 (H) 4.0 - 10.5 K/uL   RBC 3.50 (L) 4.22 - 5.81 MIL/uL   Hemoglobin 8.6 (L) 13.0 - 17.0 g/dL   HCT 28.6 (L) 39.0 - 52.0 %   MCV 81.7 78.0 - 100.0 fL   MCH 24.6 (L) 26.0 - 34.0 pg   MCHC 30.1 30.0 - 36.0 g/dL   RDW 16.7 (H) 11.5 - 15.5 %   Platelets 539 (H) 150 - 400 K/uL    Studies/Results: No results found.    Assessment: Gastric ileus  Plan:   Seems to be doing well at this time. I think we can remove the NG tube.    Cassell Clement 08/24/2015, 9:43 AM  Pager: [phone] If no answer or after 5 PM call [phone]

## 2015-08-24 NOTE — Progress Notes (Signed)
[facility] PROGRESS NOTE  [name] [name] [id number] DOB: [birth year] DOA: 08/11/2015 PCP: [name], MD  Brief narrative 50 year old quadriplegic male ( following C-spine fracture) with chronic nonhealing sacral wounds, chronic pelvic osteomyelitis, chronic decubitus ulcer recurrent sepsis presented with malaise with concern for sepsis. 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 Hospital Course prolonged due to development of ? partial small bowel obstruction.    Assessment/Plan: Sepsis Now resolved. Suspect etiology to be right rib wound status post debridement growing Serratia and Pseudomonas. Based on sensitivity ID recommend 6 weeks of IV cefepime. (Stop date 09/27/2015) Remains afebrile.   Gastric ileus  significant gastric distention on 4/3. CT abdomen and pelvis does not show any signs of bowel obstruction. NG tube placed with slow progressive response. Increased Reglan to 10 mg every 6 hours.  Seen by [name] . Appreciated recommendations. NG clamped since yesterday.toelrating clears.  Ileus now resolved. NG dced and diet advanced.  Chronic thoracic pelvic osteomyelitis No definite surgical treatment plan. He was seen by [name] at [facility] and offered hemipelvectomy which patient declined. On empiric antibiotics. Seen by [name] on 4/6. Wound appears good. Acell and vac placed over the wound.  Recommends patient can be discharged home with a VAC requesting change 1-2 times a week.   Polymicrobial UTI  Possibly contamination from his suprapubic catheter   Acute on chronic anemia Possibly of chronic disease and? Chronic blood loss from sacral wounds. Received  PRBC on 4/2. Monitor H&H.  Hypokalemia Replenish with IV fluids and iv kcl. Replenished low magnesium.  C5 quadriplegia  DVT Prophylaxis: SCDs Diet: full liquid  Code Status: Full code Family Communication: at bedside Disposition Plan: home tomorrow if tolerating advanced  diet.   Consultants:  ID  Plastic surgery  Surgery (signed off)  [name]   Procedures:  Right rib biopsy with wound excision  Antibiotics:  IV cefepime until 5/14  HPI/Subjective: Seen and examined. NG clamped since yesterday and toleating well. No distension. Increased colostomy output Objective: Filed Vitals:   08/23/15 2324 08/24/15 0531  BP: 135/87 112/69  Pulse: 103 99  Temp: 98.8 F (37.1 C) 98.6 F (37 C)  Resp: 18 18    Intake/Output Summary (Last 24 hours) at 08/24/15 1114 Last data filed at 08/24/15 0850  Gross per 24 hour  Intake 2117.5 ml  Output   1410 ml  Net  707.5 ml   Filed Weights   08/11/15 2120 08/12/15 0339 08/12/15 1340  Weight: 89.359 kg (197 lb) 90.719 kg (200 lb) 92.08 kg (203 lb)    Exam:   General:  Middle aged quadriplegic male not in distress,   HEENT: NG clamped,  moist mucosa  Chest: Clear bilaterally  Cardiovascular: Normal S1 and S2, no murmurs rub or gallop  GI: Soft, colostomy with  stool, suprapubic catheter, Bowel sounds present  Musculoskeletal: Right-upper back wound vac,  chronic pelvic ulcer    Data Reviewed: Basic Metabolic Panel:  Recent Labs Lab 08/19/15 1651 08/20/15 0942 08/21/15 0515 08/22/15 0424 08/23/15 0620 08/24/15 0444  NA  --  143 143 146* 144 140  K  --  2.9* 3.4* 3.5 3.6 3.7  CL  --  102 105 108 107 104  CO2  --  26 21* 21* 28 28  GLUCOSE  --  88 64* 74 101* 98  BUN  --  <5* <5* <5* 6 <5*  CREATININE  --  0.42* 0.61 0.70 0.34* 0.30*  CALCIUM  --  8.2* 8.4* 8.7*  8.7* 8.7*  MG 1.8  --  2.1  --   --  1.7   Liver Function Tests:  Recent Labs Lab 08/17/15 2018 08/18/15 0448 08/19/15 0443  AST 13* 15 15  ALT 14* 12* 12*  ALKPHOS 70 69 73  BILITOT 0.5 0.5 0.4  PROT 5.8* 5.9* 6.1*  ALBUMIN 1.7* 1.6* 1.7*   No results for input(s): LIPASE, AMYLASE in the last 168 hours. No results for input(s): AMMONIA in the last 168 hours. CBC:  Recent Labs Lab 08/18/15 0448  08/19/15 0443 08/24/15 0444  WBC 16.9* 12.5* 14.6*  HGB 7.9* 8.3* 8.6*  HCT 24.9* 25.9* 28.6*  MCV 79.3 79.7 81.7  PLT 487* 511* 539*   Cardiac Enzymes: No results for input(s): CKTOTAL, CKMB, CKMBINDEX, TROPONINI in the last 168 hours. BNP (last 3 results) No results for input(s): BNP in the last 8760 hours.  ProBNP (last 3 results) No results for input(s): PROBNP in the last 8760 hours.  CBG: No results for input(s): GLUCAP in the last 168 hours.  No results found for this or any previous visit (from the past 240 hour(s)).   Studies: No results found.  Scheduled Meds: . sodium chloride   Intravenous Once  . antiseptic oral rinse  7 mL Mouth Rinse BID  . baclofen  20 mg Oral QID  . ceFEPime (MAXIPIME) IV  2 g Intravenous 3 times per day  . collagenase   Topical Daily  . darifenacin  15 mg Oral Daily  . diclofenac sodium  2 g Topical QID  . ferrous sulfate  300 mg Per Tube TID WC  . lactose free nutrition  237 mL Oral TID BM  . magnesium oxide  400 mg Oral Daily  . metoCLOPramide (REGLAN) injection  10 mg Intravenous 4 times per day  . metroNIDAZOLE  500 mg Oral Q8H  . multivitamin  15 mL Per Tube Daily  . nutrition supplement (JUVEN)  1 packet Oral BID BM  . pantoprazole sodium  40 mg Per Tube BID WC  . sucralfate  1 g Oral TID WC & HS   Continuous Infusions: . 0.9 % NaCl with KCl 40 mEq / L 75 mL/hr (08/23/15 1948)      Time spent: 25 minutes    Adarsh Mundorf, [place]  [facility] Pager [phone]. If 7PM-7AM, please contact night-coverage at [email or web address], password [facility] 08/24/2015, 11:14 AM  LOS: 12 days

## 2015-08-25 DIAGNOSIS — R7881 Bacteremia: Secondary | ICD-10-CM

## 2015-08-25 DIAGNOSIS — E43 Unspecified severe protein-calorie malnutrition: Secondary | ICD-10-CM

## 2015-08-25 DIAGNOSIS — K567 Ileus, unspecified: Secondary | ICD-10-CM | POA: Diagnosis not present

## 2015-08-25 DIAGNOSIS — A498 Other bacterial infections of unspecified site: Secondary | ICD-10-CM

## 2015-08-25 DIAGNOSIS — S2190XS Unspecified open wound of unspecified part of thorax, sequela: Secondary | ICD-10-CM

## 2015-08-25 DIAGNOSIS — A4152 Sepsis due to Pseudomonas: Secondary | ICD-10-CM

## 2015-08-25 LAB — TISSUE CULTURE: GRAM STAIN: NONE SEEN

## 2015-08-25 LAB — BASIC METABOLIC PANEL
ANION GAP: 10 (ref 5–15)
BUN: 8 mg/dL (ref 6–20)
CALCIUM: 8.7 mg/dL — AB (ref 8.9–10.3)
CO2: 29 mmol/L (ref 22–32)
Chloride: 98 mmol/L — ABNORMAL LOW (ref 101–111)
Creatinine, Ser: 0.35 mg/dL — ABNORMAL LOW (ref 0.61–1.24)
GLUCOSE: 106 mg/dL — AB (ref 65–99)
POTASSIUM: 4.3 mmol/L (ref 3.5–5.1)
Sodium: 137 mmol/L (ref 135–145)

## 2015-08-25 MED ORDER — CEFEPIME HCL 2 G IJ SOLR
2.0000 g | Freq: Three times a day (TID) | INTRAMUSCULAR | Status: AC
Start: 2015-08-25 — End: 2015-09-30

## 2015-08-25 MED ORDER — HEPARIN SOD (PORK) LOCK FLUSH 100 UNIT/ML IV SOLN
250.0000 [IU] | INTRAVENOUS | Status: DC | PRN
  Administered 2015-08-25: 250 [IU]

## 2015-08-25 MED ORDER — DEXTROSE 5 % IV SOLN: 2.0000 g | Freq: Three times a day (TID) | INTRAVENOUS | Status: DC

## 2015-08-25 MED ORDER — METRONIDAZOLE 500 MG PO TABS: 500.0000 mg | ORAL_TABLET | Freq: Three times a day (TID) | ORAL | Status: AC

## 2015-08-25 MED ORDER — METRONIDAZOLE 500 MG PO TABS: 500.0000 mg | ORAL_TABLET | Freq: Three times a day (TID) | ORAL | Status: DC

## 2015-08-25 NOTE — Progress Notes (Signed)
MD paged as pt expressed that he will need a doctor's note stating he has been hospitalized here. Also wants to know how long he will be on antibiotics for. Sister had mentioned to case manager that flagyl has not helped pt in past. waiting for response

## 2015-08-25 NOTE — Progress Notes (Signed)
Called wound care nurse as requested by MD to follow up wound vac change orders but informed by wound nurse that they do not handle this issue. Advised to contact plastics

## 2015-08-25 NOTE — Progress Notes (Signed)
MD called back and explained that flagyl should help pt as he is getting the antibiotic in combination with another iv antibiotic. MD is also going to do pt's doctor's note before he is discharged

## 2015-08-25 NOTE — Progress Notes (Signed)
[facility] Progress Note  Subjective: The patient is doing well. The NG tube is out. Denies nausea or vomiting. Denies abdominal pain.  Objective: Vital signs in last 24 hours: Temp:  [99.3 F (37.4 C)-99.9 F (37.7 C)] 99.9 F (37.7 C) (04/11 0501) Pulse Rate:  [100-102] 100 (04/11 0501) BP: (110-113)/(82-85) 110/82 mmHg (04/11 0501) SpO2:  [95 %-100 %] 95 % (04/11 0501) Weight change:    PE:  Abdomen soft  Lab Results: Results for orders placed or performed during the hospital encounter of 08/11/15 (from the past 24 hour(s))  Basic metabolic panel     Status: Abnormal   Collection Time: 08/25/15  4:45 AM  Result Value Ref Range   Sodium 137 135 - 145 mmol/L   Potassium 4.3 3.5 - 5.1 mmol/L   Chloride 98 (L) 101 - 111 mmol/L   CO2 29 22 - 32 mmol/L   Glucose, Bld 106 (H) 65 - 99 mg/dL   BUN 8 6 - 20 mg/dL   Creatinine, Ser 0.35 (L) 0.61 - 1.24 mg/dL   Calcium 8.7 (L) 8.9 - 10.3 mg/dL   GFR calc non Af Amer >60 >60 mL/min   GFR calc Af Amer >60 >60 mL/min   Anion gap 10 5 - 15    Studies/Results: No results found.    Assessment: Gastric ileus, symptoms resolved  Plan:   Advance diet. We will sign off. Call us if needed.    SAM F Desyre Calma 08/25/2015, 8:59 AM  Pager: [phone] If no answer or after 5 PM call [phone]

## 2015-08-25 NOTE — Care Management Note (Addendum)
Case Management Note  Patient Details  Name: [name] [name] MRN: [id number] Date of Birth: 1965-07-17  Subjective/Objective:   Patient will need HHRN resume orders for iv abx and wound vac,  Patient will also need ambulance transport at discharge to home.  NCM will need to be notified if patient is dc today in order to set up transport.   NCM filled out ptar form, tubed it to [facility]N for [employer].   [facility] notified patient is for dc today, they will be facilitating iv abx .  Per [name] Jacquet RN MD states [facility] will need to change wound vac at home.  NCM contacted patient 's sister [name] at [phone], informed her that patient is being discharged today, she had a question about patient's medications.  NCM will have floor RN to call her back.               Action/Plan:   Expected Discharge Date:                  Expected Discharge Plan:  [place]  In-House Referral:     Discharge planning Services  CM Consult  Post Acute Care Choice:  Home Health, Resumption of Svcs/PTA Provider Choice offered to:  Patient  DME Arranged:  Vac DME Agency:  [facility]  HH Arranged:  RN, IV Antibiotics HH Agency:  [facility]  Status of Service:  Completed, signed off  Medicare Important Message Given:    Date Medicare IM Given:    Medicare IM give by:    Date Additional Medicare IM Given:    Additional Medicare Important Message give by:     If discussed at [place] of Stay Meetings, dates discussed:    Additional Comments:  [name] Mayo, RN 08/25/2015, 1:13 PM

## 2015-08-25 NOTE — Progress Notes (Signed)
RN called MD office to seek clarity about patient's wound vac dressing. Per Crystal, RN at Dr. Eusebio Friendly office, we are not to change the wound vac prior to the patient discharging. The orders we are seeing are for the home health nurse.

## 2015-08-25 NOTE — Discharge Summary (Addendum)
Physician Discharge Summary  [name] [name] [id number] DOB: 1965-08-16 DOA: 08/11/2015  PCP: [name] Greenland, MD  Admit date: 08/11/2015 Discharge date: 08/25/2015  Time spent: 35 minutes  Recommendations for Outpatient Follow-up:  1. Discharge home with home health. 2. Patient will complete total 6 weeks of IV antibiotics (cefepime 2 g every 8 hours). Also added oral metronidazole 500 mg 3 times a day to cover for bacterial weights and other adults. Patient recommended to continue antibiotics through 09/30/2015 until he has  follow-up with ID Dr. 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[name]   Severe protein-calorie malnutrition ([name]   Iron deficiency anemia   Sepsis ([name]   Fever   Bacteremia   N&V (nausea and vomiting)   Sacral wound   Wound, open, trunk   Pseudomonas infection   Serratia infection   Discharge Condition: Fair  Diet recommendation: Regular    CODE STATUS: Full code  [name] Weights   08/11/15 2120 08/12/15 0339 08/12/15 1340  Weight: 89.359 kg (197 lb) 90.719 kg (200 lb) 92.08 kg (203 lb)    History of present illness:  Please refer to admission H&P for details, in brief, 50 year old quadriplegic male ( following C-spine fracture) with chronic nonhealing sacral wounds, chronic pelvic osteomyelitis, chronic decubitus ulcer recurrent sepsis presented with malaise with concern for sepsis. 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 Hospital Course prolonged due to development of gastric ileus.   Hospital Course:  Sepsis Now resolved. Suspect etiology to be right rib wound status post debridement growing Serratia and Pseudomonas. Based on sensitivity patient started on empiric IV cefepime for at least 6 weeks  course. Added oral Flagyl to cover for bacteroids and other anaerobes. ID recommend to continue antibiotics through 09/30/2015 until seen by Dr. Johnnye [name] in the office.   Gastric ileus significant gastric distention on 4/3. CT abdomen and pelvis does not show any signs of bowel obstruction. NG tube placed with slow progressive response. Placed on scheduled IV Reglan. Seen by [name] . Appreciated recommendations on conservative management. Ileus now resolved. NG is continued and diet advanced which he is tolerating well. Continue scheduled Reglan at home.  Chronic thoracic pelvic osteomyelitis No definite surgical treatment plan. He was seen by Dr. Luetta [name] at [name] and offered hemipelvectomy which patient declined. On empiric antibiotics. Seen by Dr. Marla [name] on 4/6. Wound appears good. Acell and vac placed over the wound. Recommends patient can be discharged home with a VAC requesting change 1-2 times a week. He'll follow-up with Dr. Marla [name] in the office.   Polymicrobial UTI Possibly contamination from his suprapubic catheter   Acute on chronic anemia Possibly of chronic disease and? Chronic blood loss from sacral wounds. Received PRBC on 4/2. H&H stable posttransfusion.  Hypokalemia/hypomagnesemia Replenished  C5 quadriplegia   Family Communication: None at bedside Disposition Plan: home with home health   Consultants:  ID  Plastic surgery  Surgery  [name]  Procedures:  Right rib biopsy with wound excision  Antibiotics:  IV cefepime and oral Flagyl through 5/17 until seen by ID    Discharge Exam: Filed Vitals:   08/25/15 0501 08/25/15 1453  BP: 110/82 112/79  Pulse: 100 101  Temp: 99.9 F (37.7 C) 98.9 F (37.2 C)  Resp:  18     General: Middle aged quadriplegic male not in distress,   HEENT: NG clamped, moist mucosa  Chest: Clear bilaterally  Cardiovascular: Normal S1 and S2, no murmurs rub or gallop  [name]: Soft, colostomy with  stool, suprapubic catheter, Bowel sounds present  Musculoskeletal: Right-upper back wound vac, chronic pelvic ulcer  CNS: Alert and oriented  Discharge Instructions    Current Discharge Medication List    START taking these medications   Details  ceFEPIme 2 g in dextrose 5 % 50 mL Inject 2 g into the vein every 8 (eight) hours. Qty: 102 Dose, Refills: 0      CONTINUE these medications which have CHANGED   Details  metroNIDAZOLE (FLAGYL) 500 MG tablet Take 1 tablet (500 mg total) by mouth every 8 (eight) hours. Qty: 102 tablet, Refills: 0      CONTINUE these medications which have NOT CHANGED   Details  acetaminophen (TYLENOL) 325 MG tablet Take 2 tablets (650 mg total) by mouth every 6 (six) hours as needed for mild pain (or Fever >/= 101).    baclofen (LIORESAL) 20 MG tablet Take 20 mg by mouth 4 (four) times daily.     collagenase (SANTYL) ointment Apply topically daily. Apply to right flank wound, sacral/ischial wounds, right medial heel, left medial ankle wounds daily. Qty: 15 g, Refills: 0    diclofenac sodium (VOLTAREN) 1 % GEL Apply 2 g topically 4 (four) times daily. Apply to right lateral deltoid and left anterior shoulder. Qty: 300 g, Refills: 0    docusate sodium (COLACE) 100 MG capsule Take 2 capsules (200 mg total) by mouth 2 (two) times daily. Qty: 10 capsule, Refills: 0    ferrous sulfate 325 (65 FE) MG EC tablet Take 1 tablet (325 mg total) by mouth 3 (three) times daily with meals. Qty: 90 tablet, Refills: 3   Associated Diagnoses: Anemia, unspecified anemia type    furosemide (LASIX) 20 MG tablet Take 1 tablet (20 mg total) by mouth 2 (two) times daily as needed for fluid or edema. Take with Klor-Con Qty: 30 tablet, Refills: 2   Associated Diagnoses: Anemia, unspecified anemia type    lactose free nutrition (BOOST) LIQD Take 237 mLs by mouth 3 (three) times daily between meals.    magnesium oxide (MAG-OX) 400 (241.3 MG) MG tablet Take 1 tablet (400  mg total) by mouth daily. Qty: 30 tablet, Refills: 1    metoCLOPramide (REGLAN) 10 MG tablet Take 1 tablet (10 mg total) by mouth 3 (three) times daily before meals. Qty: 90 tablet, Refills: 0    Multiple Vitamin (MULTIVITAMIN WITH MINERALS) TABS Take 1 tablet by mouth every morning.     nutrition supplement, JUVEN, (JUVEN) PACK Take 1 packet by mouth 2 (two) times daily between meals. Refills: 0    ondansetron (ZOFRAN ODT) 4 MG disintegrating tablet Take 1 tablet (4 mg total) by mouth every 8 (eight) hours as needed for nausea. Qty: 10 tablet, Refills: 0    pantoprazole (PROTONIX) 40 MG tablet Take 1 tablet (40 mg total) by mouth 2 (two) times daily before a meal.    potassium chloride SA (K-DUR,KLOR-CON) 20 MEQ tablet Take 2 tablets (40 mEq total) by mouth 2 (two) times daily as needed (take with lasix for fluid).    sucralfate (CARAFATE) 1 G tablet Take 1 tablet (1 g total) by mouth 4 (four) times daily. Qty: 30 tablet, Refills: 2    VESICARE 10 MG tablet Take 10 mg by mouth daily.  Refills: 6    vitamin C (ASCORBIC ACID) 500 MG tablet Take 500 mg by mouth every morning.     Zinc 50 MG TABS Take 50 mg by mouth 2 (two) times daily.    lidocaine (LIDODERM) 5 % Place 1 patch onto the skin daily. Remove & Discard patch within 12 hours or as directed by MD Qty: 30 patch, Refills: 0      STOP taking these medications     Cefixime (SUPRAX) 400 MG CAPS capsule        Allergies  Allergen Reactions  . Ditropan [Oxybutynin] Other (See Comments)    hallucinations   Follow-up Information    Follow up with [facility].   Why:  Resume [name]   Contact information:   [address] [place] [address] [phone]       Follow up with [facility] .   Why:  wound vac      Follow up with [name] Greenland, MD. Schedule an appointment as soon as possible for a visit in 2 weeks.   Specialty:  Internal Medicine   Contact information:   [address] [place] [address] [phone]       Follow up with [name], MD. Schedule an appointment as soon as possible for a visit in 6 weeks.   Specialty:  Infectious Diseases   Contact information:   [employer] [place] [address] [phone]        The results of significant diagnostics from this hospitalization (including imaging, microbiology, ancillary and laboratory) are listed below for reference.    Significant Diagnostic Studies: Ct Abdomen Pelvis W Contrast  08/18/2015  CLINICAL DATA:  Small bowel obstruction. EXAM: CT ABDOMEN AND PELVIS WITH CONTRAST TECHNIQUE: Multidetector CT imaging of the abdomen and pelvis was performed using the standard protocol following bolus administration of intravenous contrast. CONTRAST:  161mL ISOVUE-300 IOPAMIDOL (ISOVUE-300) INJECTION 61% COMPARISON:  Abdominal radiograph earlier this day. Most recent CT 05/29/2015 FINDINGS: Lower chest: Moderate bilateral pleural effusions, right greater than left. Adjacent compressive atelectasis in both lower lobes. Small pericardial effusion. Probable decubitus ulcer involving the dependent right lower posterior hamate thorax. No subjacent bony destructive change of the right ribs, however there is increased scleroses of the lateral right tenth rib. This ulcer and rib change and new from prior CT. Remote left rib fractures. Liver: No focal lesion. Hepatobiliary: Gallbladder decompressed, no calcified stone. No biliary dilatation. Pancreas: No ductal dilatation or inflammation. Spleen: Normal. Adrenal glands: No nodule. Kidneys: Left greater than right perinephric stranding, increased from prior CT. Small left renal cyst. Symmetric renal excretion. No definite striated nephrogram is seen. Stomach/Bowel: Enteric tube within the stomach. Decreased gastric distention from radiographs earlier this day. There is enteric contrast within the distal esophagus, stomach, and proximal small bowel. No  gastric wall thickening or external mass effect to suggest gastric outlet obstruction. No small bowel dilatation. Descending colostomy with small to moderate stool burden. Long Hartmann's pouch with retained stool again seen. There is rectal wall thickening. Vascular/Lymphatic: Multiple retroperitoneal lymph nodes measuring up to 8 mm short axis, unchanged from prior exam. Enlarged external iliac lymph nodes bilaterally, also not significantly changed. Tortuous abdominal aorta. There is a retro aortic left renal vein. Reproductive: Prostate gland normal in size. Bladder: Supra-pubic catheter in place. Mild wall thickening appears chronic. Other: No free air or intra-abdominal fluid collection. Musculoskeletal: Chronic sacral decubitus ulcer extending to bone. Chronic osteomyelitis of both hips  with decubitus ulcer on the right. Chronic multifocal pelvic sclerosis and sequela of chronic osteomyelitis. IMPRESSION: 1. No evidence of bowel obstruction. Decreased gastric distention with enteric tube in place. No findings to suggest gastric outlet or small bowel obstruction. 2. Probable new decubitus ulcer involving the right lower posterior thorax with scleroses of the right posterior tenth rib, new from exam 3 months prior, and concerning for rib osteomyelitis. Sacral decubitus ulcer with chronic osteomyelitis of the pelvis and both hips appears unchanged from prior. 3. Bilateral pleural effusions and compressive atelectasis, increased from prior. Trace pericardial effusion. 4. Increased left perinephric stranding from prior, urinary tract infection not excluded. 5. Additional chronic findings as described. Electronically Signed   By: Jeb Levering M.D.   On: 08/18/2015 01:30   Ir Fluoro Guide Cv Line Right  08/17/2015  CLINICAL DATA:  Bacteremia, needs IV access for infusion therapy EXAM: PICC PLACEMENT WITH ULTRASOUND AND FLUOROSCOPY FLUOROSCOPY TIME:  0.1 minute, 6 uGym2 DAP TECHNIQUE: After written informed  consent was obtained, patient was placed in the supine position on angiographic table. Patency of the right brachial vein was confirmed with ultrasound with image documentation. An appropriate skin site was determined. Skin site was marked. Region was prepped using maximum barrier technique including cap and mask, sterile gown, sterile gloves, large sterile sheet, and Chlorhexidine as cutaneous antisepsis. The region was infiltrated locally with 1% lidocaine. Under real-time ultrasound guidance, the right brachial vein was accessed with a 21 gauge micropuncture needle; the needle tip within the vein was confirmed with ultrasound image documentation. Needle exchanged over a 018 guidewire for a peel-away sheath, through which a 5-French double-lumen power injectable PICC trimmed to 41cm was advanced, positioned with its tip near the cavoatrial junction. Spot chest radiograph confirms appropriate catheter position. Catheter was flushed per protocol and secured externally. The patient tolerated procedure well. COMPLICATIONS: COMPLICATIONS none IMPRESSION: 1. Technically successful five [place] double lumen power injectable PICC placement Electronically Signed   By: Lucrezia Europe M.D.   On: 08/17/2015 10:28   Ir US Guide Vasc Access Right  08/17/2015  CLINICAL DATA:  Bacteremia, needs IV access for infusion therapy EXAM: PICC PLACEMENT WITH ULTRASOUND AND FLUOROSCOPY FLUOROSCOPY TIME:  0.1 minute, 6 uGym2 DAP TECHNIQUE: After written informed consent was obtained, patient was placed in the supine position on angiographic table. Patency of the right brachial vein was confirmed with ultrasound with image documentation. An appropriate skin site was determined. Skin site was marked. Region was prepped using maximum barrier technique including cap and mask, sterile gown, sterile gloves, large sterile sheet, and Chlorhexidine as cutaneous antisepsis. The region was infiltrated locally with 1% lidocaine. Under real-time  ultrasound guidance, the right brachial vein was accessed with a 21 gauge micropuncture needle; the needle tip within the vein was confirmed with ultrasound image documentation. Needle exchanged over a 018 guidewire for a peel-away sheath, through which a 5-French double-lumen power injectable PICC trimmed to 41cm was advanced, positioned with its tip near the cavoatrial junction. Spot chest radiograph confirms appropriate catheter position. Catheter was flushed per protocol and secured externally. The patient tolerated procedure well. COMPLICATIONS: COMPLICATIONS none IMPRESSION: 1. Technically successful five [place] double lumen power injectable PICC placement Electronically Signed   By: Lucrezia Europe M.D.   On: 08/17/2015 10:28   Dg Abd Portable 1v  08/17/2015  CLINICAL DATA:  Nausea vomiting and abdominal distention EXAM: PORTABLE ABDOMEN - 1 VIEW COMPARISON:  05/29/2015 CT scan FINDINGS: There is pronounced severe gastric distention. There is  gas further into bowel. IMPRESSION: Although there is some gas beyond the stomach, there is severe gastric gaseous distention. Possibility of gastric outlet obstruction not excluded. Electronically Signed   By: Skipper Cliche M.D.   On: 08/17/2015 07:02    Microbiology: No results found for this or any previous visit (from the past 240 hour(s)).   Labs: Basic Metabolic Panel:  Recent Labs Lab 08/19/15 1651  08/21/15 0515 08/22/15 0424 08/23/15 0620 08/24/15 0444 08/25/15 0445  NA  --   < > 143 146* 144 140 137  K  --   < > 3.4* 3.5 3.6 3.7 4.3  CL  --   < > 105 108 107 104 98*  CO2  --   < > 21* 21* 28 28 29   GLUCOSE  --   < > 64* 74 101* 98 106*  BUN  --   < > <5* <5* 6 <5* 8  CREATININE  --   < > 0.61 0.70 0.34* 0.30* 0.35*  CALCIUM  --   < > 8.4* 8.7* 8.7* 8.7* 8.7*  MG 1.8  --  2.1  --   --  1.7  --   < > = values in this interval not displayed. Liver Function Tests:  Recent Labs Lab 08/19/15 0443  AST 15  ALT 12*  ALKPHOS 73  BILITOT  0.4  PROT 6.1*  ALBUMIN 1.7*   No results for input(s): LIPASE, AMYLASE in the last 168 hours. No results for input(s): AMMONIA in the last 168 hours. CBC:  Recent Labs Lab 08/19/15 0443 08/24/15 0444  WBC 12.5* 14.6*  HGB 8.3* 8.6*  HCT 25.9* 28.6*  MCV 79.7 81.7  PLT 511* 539*   Cardiac Enzymes: No results for input(s): CKTOTAL, CKMB, CKMBINDEX, TROPONINI in the last 168 hours. BNP: BNP (last 3 results) No results for input(s): BNP in the last 8760 hours.  ProBNP (last 3 results) No results for input(s): PROBNP in the last 8760 hours.  CBG: No results for input(s): GLUCAP in the last 168 hours.     Signed:  Louellen Molder MD.  [facility] 08/25/2015, 3:32 PM

## 2015-08-25 NOTE — Progress Notes (Signed)
Paged Dr Marla Roe and spoke to her nurse. Notified that she will place orders in pt's chart

## 2015-08-25 NOTE — Progress Notes (Signed)
Pt discharged home with home health via PTA in stable condition. Discharge education provided prior to discharge. Pt voiced understanding discharge teaching. Pt's sister also called and notified that pt will be coming home. Wound vac and wound vac supplies sent home with pt. Pt left unit hooked up  To home wound vac with continuous therapy set to 125 as previously ordered

## 2015-08-25 NOTE — Care Management Important Message (Signed)
Important Message  Patient Details  Name: SEBASTAIN ROEBKE MRN: [id number] Date of Birth: [birth year]   Medicare Important Message Given:  Yes    [name], RN 08/25/2015, 5:48 [place] Message  Patient Details  Name: AUGUSTIN VISCOMI MRN: [id number] Date of Birth: 07-14-1965   Medicare Important Message Given:  Yes    [name], RN 08/25/2015, 5:48 PM

## 2015-08-27 ENCOUNTER — Encounter (HOSPITAL_COMMUNITY): Payer: Self-pay | Admitting: *Deleted

## 2015-08-27 DIAGNOSIS — T8189XA Other complications of procedures, not elsewhere classified, initial encounter: Secondary | ICD-10-CM | POA: Diagnosis not present

## 2015-08-30 ENCOUNTER — Telehealth: Payer: Self-pay | Admitting: Internal Medicine

## 2015-08-30 NOTE — Telephone Encounter (Signed)
I received a telephone call last night from Mr. [facility] nurse. She reported that Mr. Thrasher said that he developed some facial numbness while his cefepime was infusing. He was feeling better after stopping it. I asked her to hold subsequent doses for now. His recent cultures grew Pseudomonas intermediately susceptible to fluoroquinolones, Serratia susceptible to quinolones and Bacteroides. I will consider rechallenge of cefepime tomorrow.

## 2015-08-31 ENCOUNTER — Telehealth: Payer: Self-pay | Admitting: *Deleted

## 2015-08-31 NOTE — Telephone Encounter (Signed)
Called patient per Dr. Hale Bogus request. He had burning on his lips/lower half of his face Saturday after the cefepime, which now has mostly resolved. His dose was 7am. He felt burning beginning around 12pm that continued until 5pm, but subsided to tingling.  He is still not tasting food.  He is willing to restart the medication, wants to know how to prepare for potential side effects.  Patient lives with his sister, will make sure he is not alone. Landis Gandy, RN

## 2015-08-31 NOTE — Telephone Encounter (Signed)
Patient calling this morning to find out what he should do at this point. He advised he feels fine today but was told not to take the Cefepime. Advised will send Dr [name] a message and call him back once he responds.

## 2015-09-01 NOTE — Telephone Encounter (Signed)
RN called [facility] per verbal order from Dr. Megan Salon.  Patient cleared to restart cefepime.  Amy will reach out to the patient to coordinate. Landis Gandy, RN

## 2015-09-02 NOTE — Telephone Encounter (Signed)
Harvin Hazel spoke with Mr. Rudnik yesterday and he was willing to retry cefepime. She called Gisela and gave and order to resume cefepime.

## 2015-09-02 NOTE — Telephone Encounter (Signed)
Please let me know the status on [name]

## 2015-09-02 NOTE — Telephone Encounter (Signed)
Per Dr [name] called the patient to try and schedule him for an office visit to make sure the medication is working and he is not having any other reactions. The ;patient advised he is due to follow up 09/30/15 and because he can only come in the afternoon he can not come sooner than that. Asked if the doctor would be ok with him calling if he needs anything. Advised he has in home nurse who can follow him and monitor for symptoms and if anything comes up he or she will call the office. Advised will let the doctor know and if he has any further questions or concerns we will call him back.

## 2015-09-02 NOTE — Telephone Encounter (Signed)
Received call from [name], [place] at [facility]. Patient has restarted the cefepime, says he "is doing fine with it."  Per [name], there are no reactions at this time. Landis Gandy, RN

## 2015-09-03 DIAGNOSIS — G4730 Sleep apnea, unspecified: Secondary | ICD-10-CM | POA: Diagnosis not present

## 2015-09-03 DIAGNOSIS — G8250 Quadriplegia, unspecified: Secondary | ICD-10-CM | POA: Diagnosis not present

## 2015-09-03 DIAGNOSIS — L89154 Pressure ulcer of sacral region, stage 4: Secondary | ICD-10-CM | POA: Diagnosis not present

## 2015-09-03 DIAGNOSIS — L89894 Pressure ulcer of other site, stage 4: Secondary | ICD-10-CM | POA: Diagnosis not present

## 2015-09-03 DIAGNOSIS — M869 Osteomyelitis, unspecified: Secondary | ICD-10-CM | POA: Diagnosis not present

## 2015-09-03 DIAGNOSIS — L8944 Pressure ulcer of contiguous site of back, buttock and hip, stage 4: Secondary | ICD-10-CM | POA: Diagnosis not present

## 2015-09-03 DIAGNOSIS — M8668 Other chronic osteomyelitis, other site: Secondary | ICD-10-CM | POA: Diagnosis not present

## 2015-09-03 DIAGNOSIS — L89620 Pressure ulcer of left heel, unstageable: Secondary | ICD-10-CM | POA: Diagnosis not present

## 2015-09-03 DIAGNOSIS — A419 Sepsis, unspecified organism: Secondary | ICD-10-CM | POA: Diagnosis not present

## 2015-09-03 DIAGNOSIS — L97819 Non-pressure chronic ulcer of other part of right lower leg with unspecified severity: Secondary | ICD-10-CM | POA: Diagnosis not present

## 2015-09-03 DIAGNOSIS — I10 Essential (primary) hypertension: Secondary | ICD-10-CM | POA: Diagnosis not present

## 2015-09-03 DIAGNOSIS — L89324 Pressure ulcer of left buttock, stage 4: Secondary | ICD-10-CM | POA: Diagnosis not present

## 2015-09-04 NOTE — Telephone Encounter (Signed)
Keep original appt thanks

## 2015-09-07 DIAGNOSIS — M8638 Chronic multifocal osteomyelitis, other site: Secondary | ICD-10-CM | POA: Diagnosis not present

## 2015-09-07 DIAGNOSIS — M869 Osteomyelitis, unspecified: Secondary | ICD-10-CM | POA: Diagnosis not present

## 2015-09-07 DIAGNOSIS — Z7189 Other specified counseling: Secondary | ICD-10-CM | POA: Diagnosis not present

## 2015-09-08 IMAGING — CR DG CHEST 1V PORT
1 series · 2 of 2 positions shown · non-contrast
Comparison: 11/07/2012.

CLINICAL DATA: Fever.

EXAM:
PORTABLE CHEST - 1 VIEW

[Series 1: AP · U · 2 of 2 slices shown]
[im 1/2]
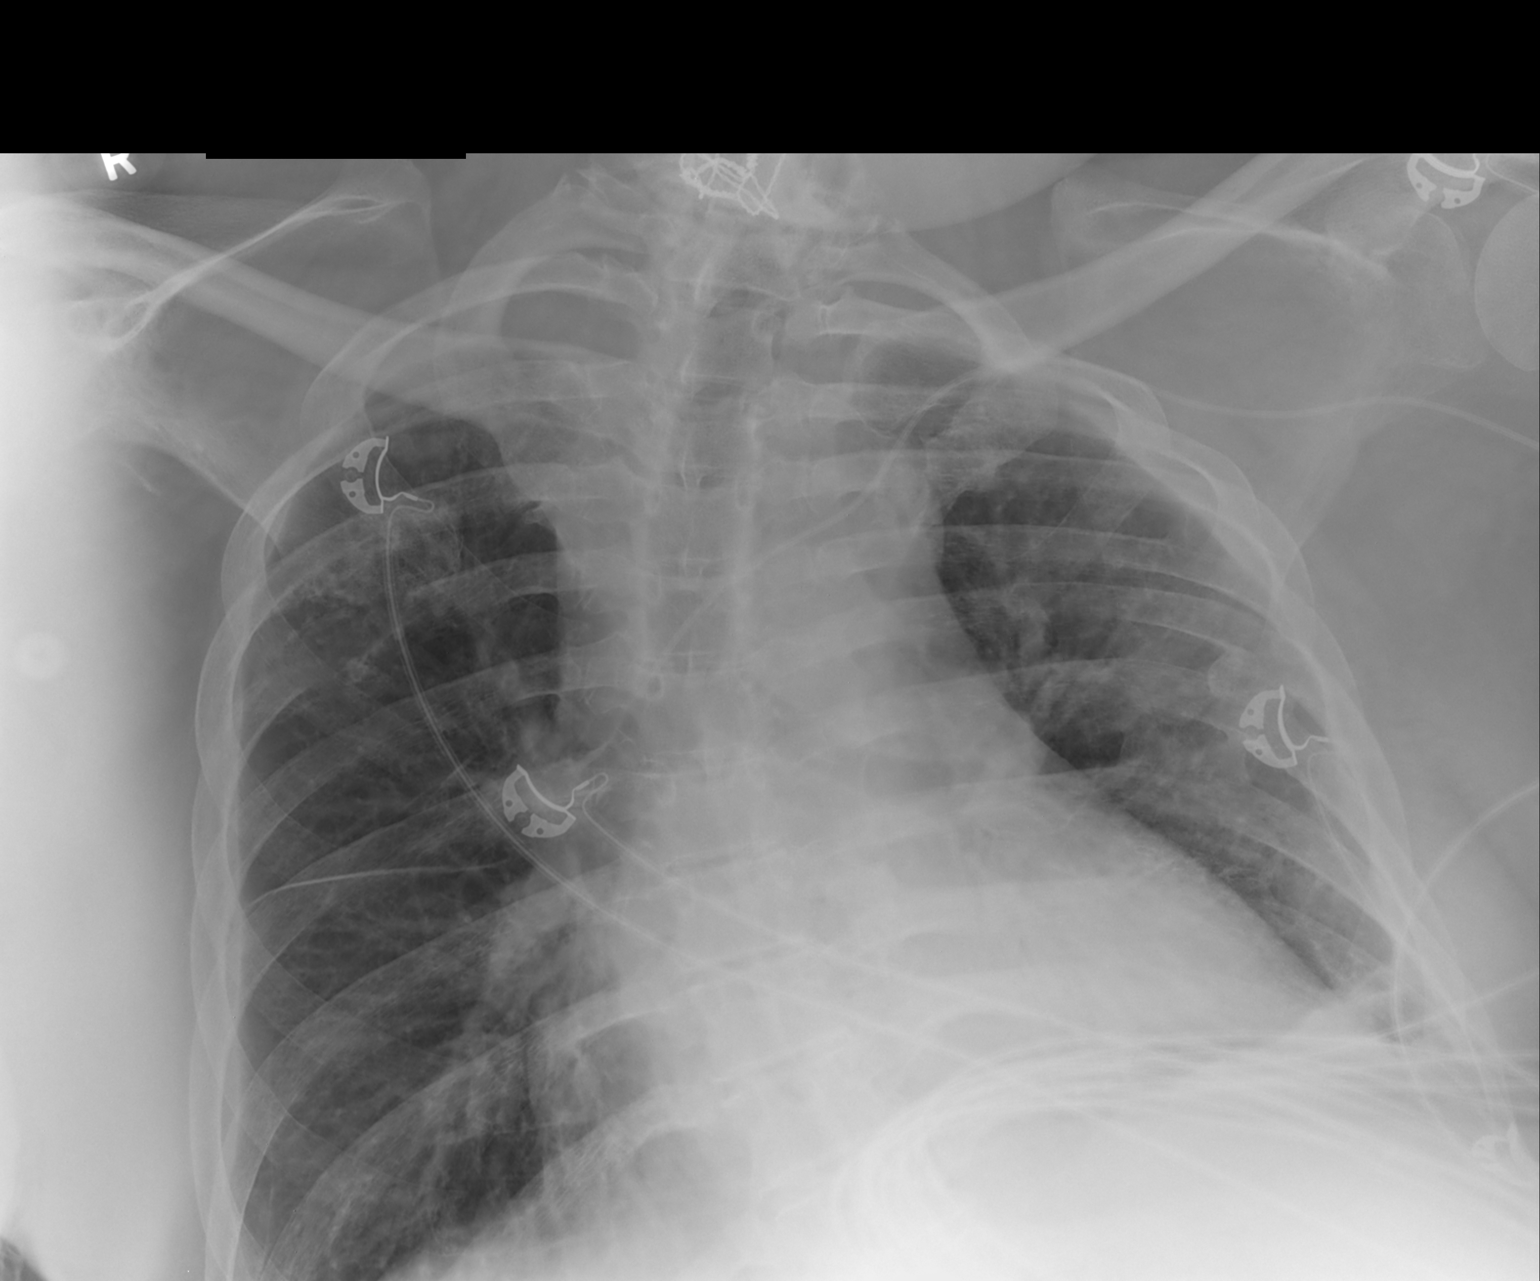
[im 2/2]
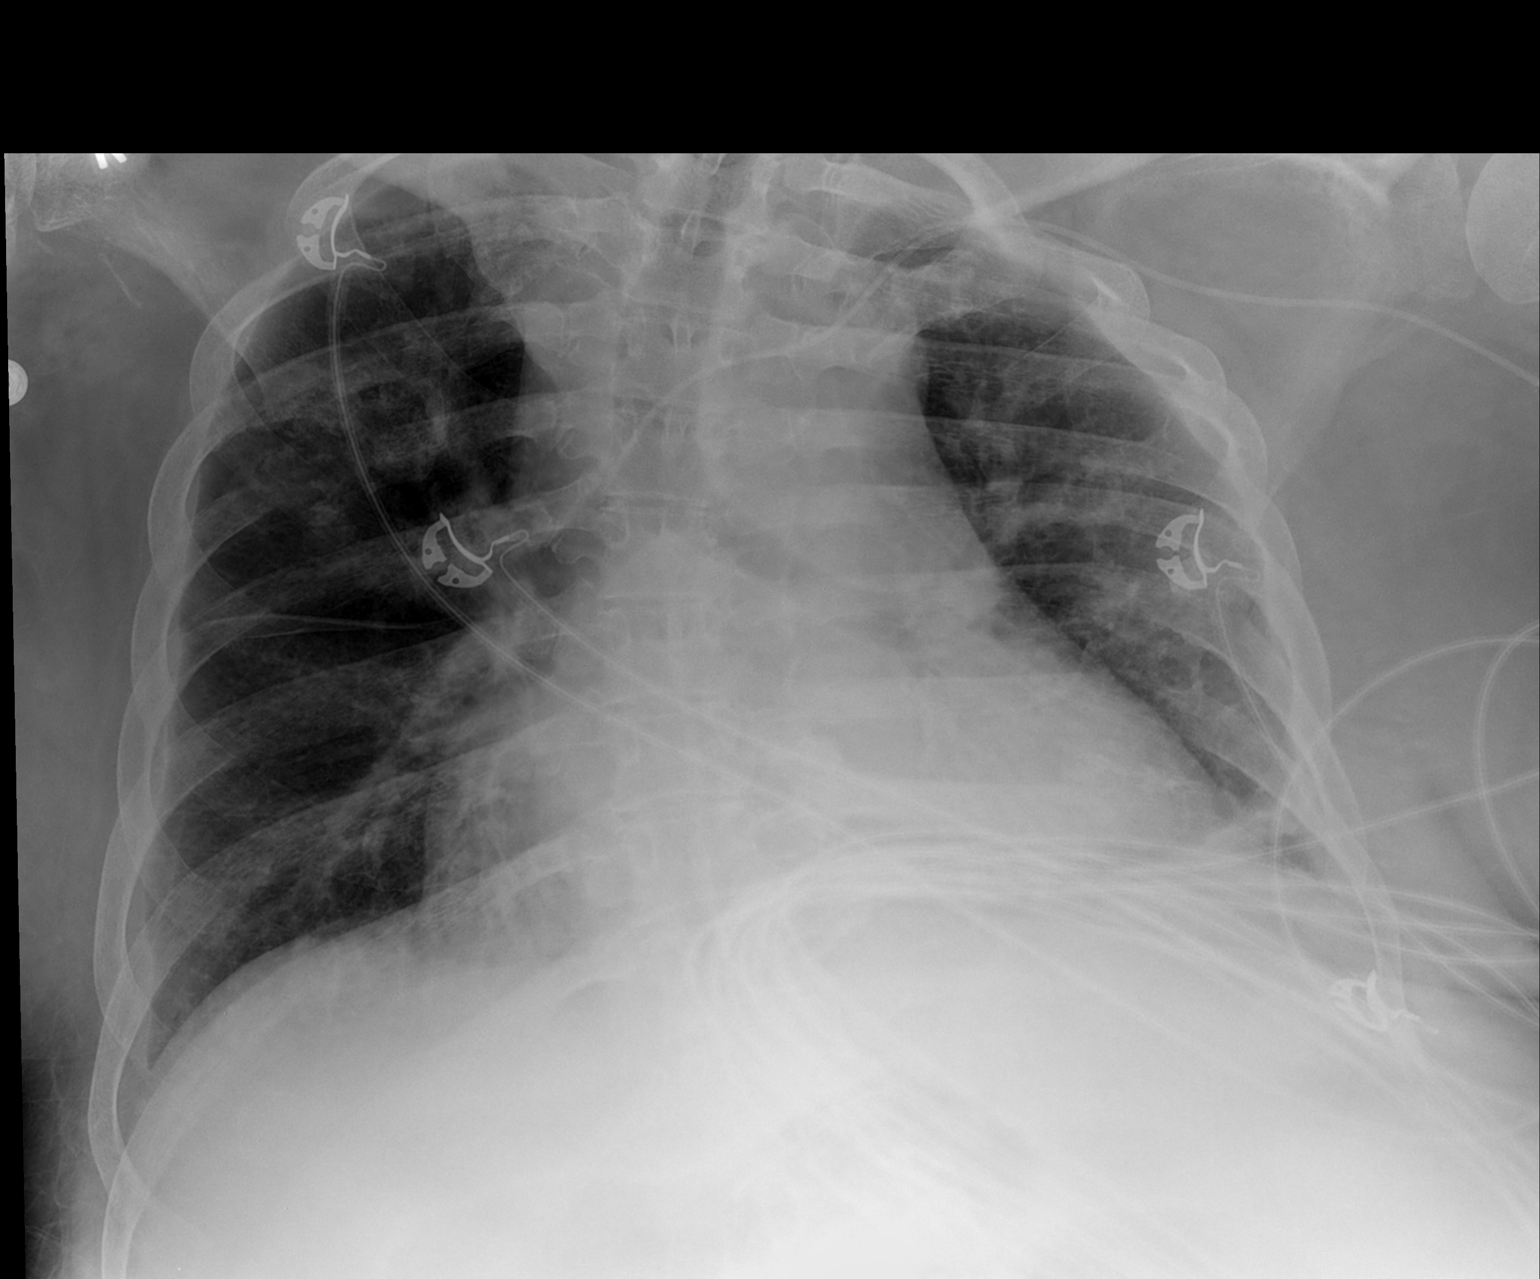

[2 of 2 positions shown; findings below may reference images not displayed]

FINDINGS: Stable enlargement of the cardiac silhouette. The lungs remain
clear. Left PICC tip in the superior vena cava. Stable moderate
dextroconvex thoracolumbar scoliosis and cervical spine fixation
wires.
IMPRESSION: No acute abnormality. Stable cardiomegaly.

## 2015-09-09 ENCOUNTER — Other Ambulatory Visit: Payer: Self-pay | Admitting: Hematology

## 2015-09-09 DIAGNOSIS — D638 Anemia in other chronic diseases classified elsewhere: Secondary | ICD-10-CM

## 2015-09-09 IMAGING — CT CT PELVIS W/ CM
2 of 3 series · 17 of 46 positions shown, 19 images · IV contrast (OMNIPAQUE)
Comparison: 08/02/12.

CLINICAL DATA: 47-year-old male with sacral wound and pelvic
osteomyelitis.

EXAM:
CT PELVIS WITH CONTRAST
TECHNIQUE: Multidetector CT imaging of the pelvis was performed using the
standard protocol following the bolus administration of intravenous
contrast.
CONTRAST:  100mL OMNIPAQUE IOHEXOL 300 MG/ML  SOLN

[Series 2: pelvis st · axial · 0.98mm/px · z∈[-341,-101]mm · 14 of 56 slices shown, 16 images]
[im 4/56  soft-tissue]
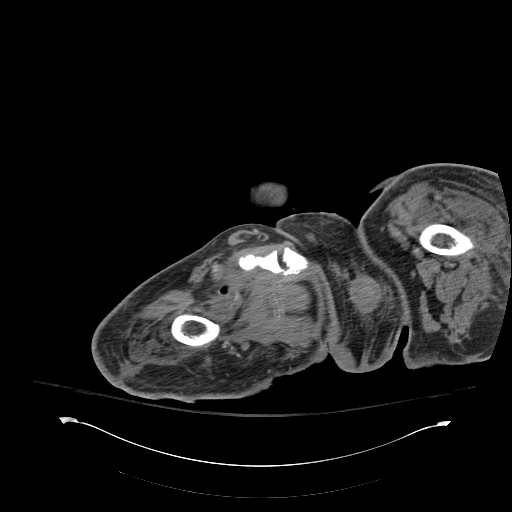
[im 4/56  bone]
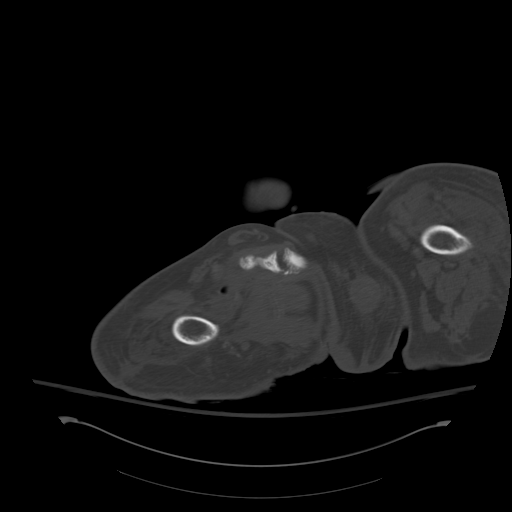
[im 8/56  soft-tissue]
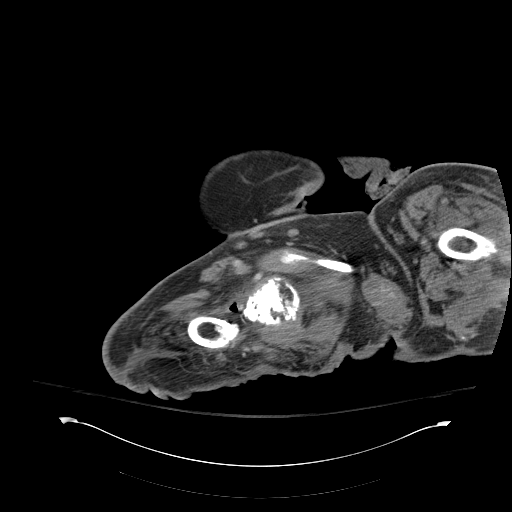
[im 11/56  soft-tissue]
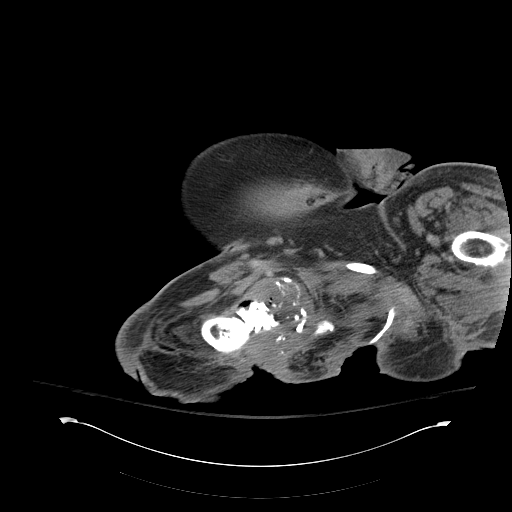
[im 15/56  soft-tissue]
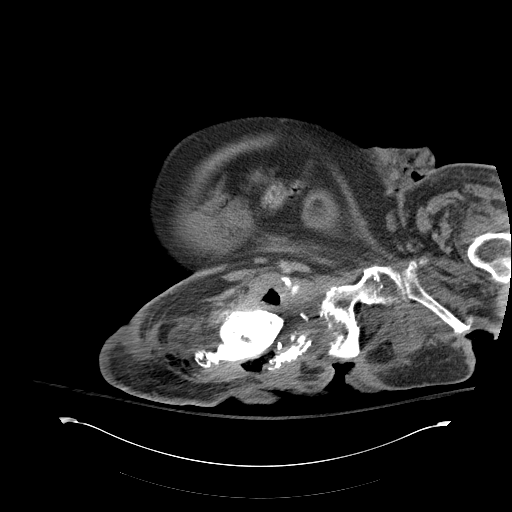
[im 18/56  soft-tissue]
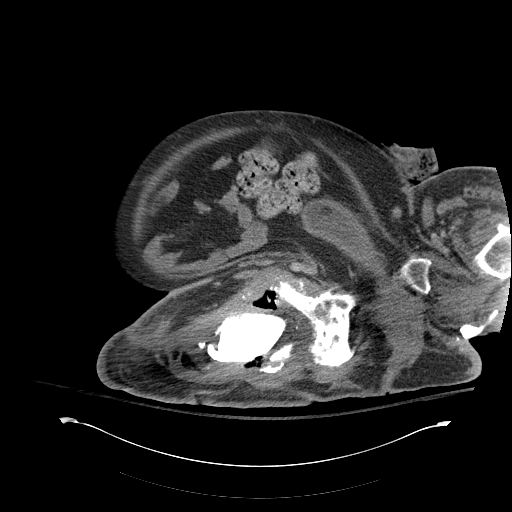
[im 22/56  soft-tissue]
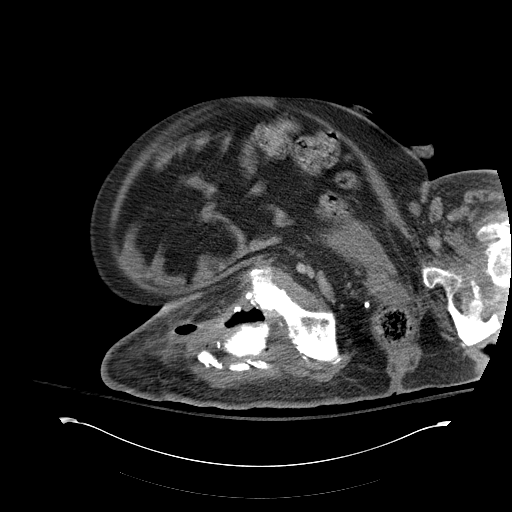
[im 25/56  soft-tissue]
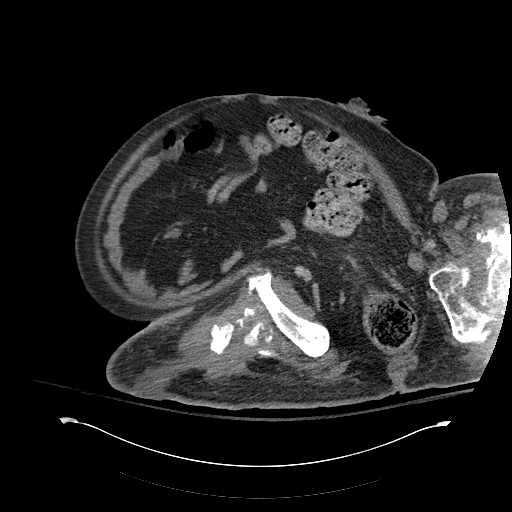
[im 31/56  soft-tissue]
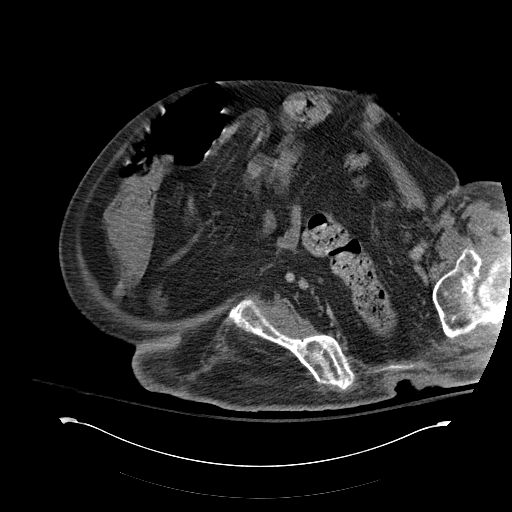
[im 34/56  soft-tissue]
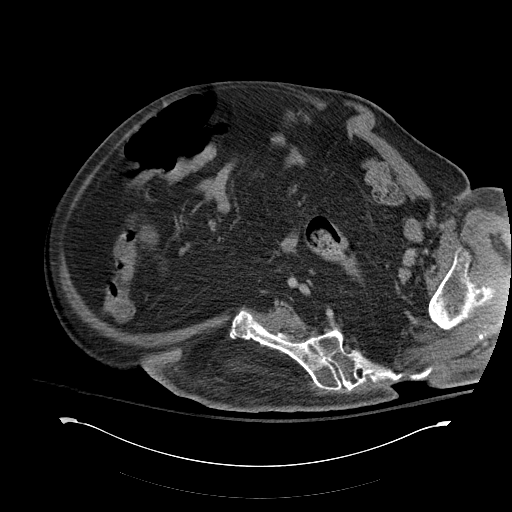
[im 34/56  bone]
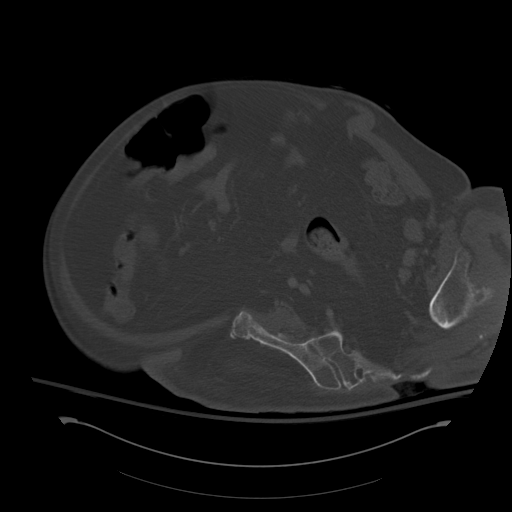
[im 38/56  soft-tissue]
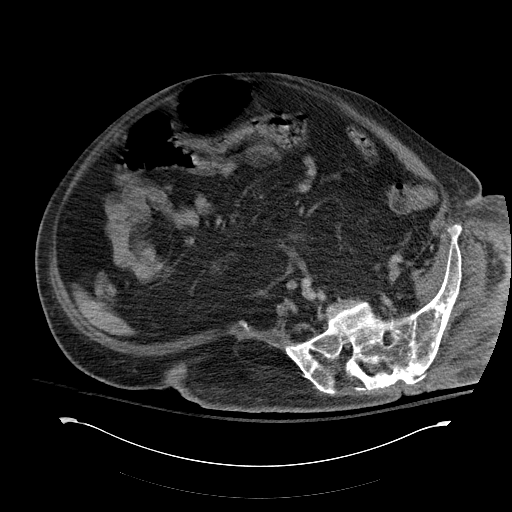
[im 41/56  soft-tissue]
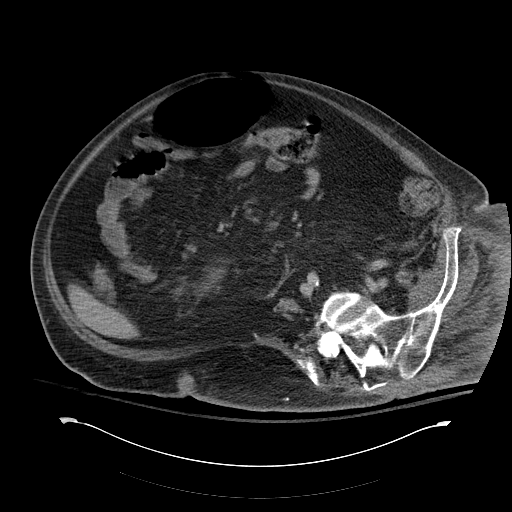
[im 45/56  soft-tissue]
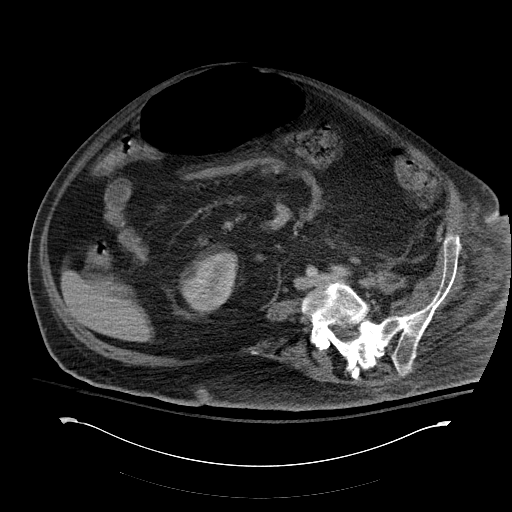
[im 48/56  soft-tissue]
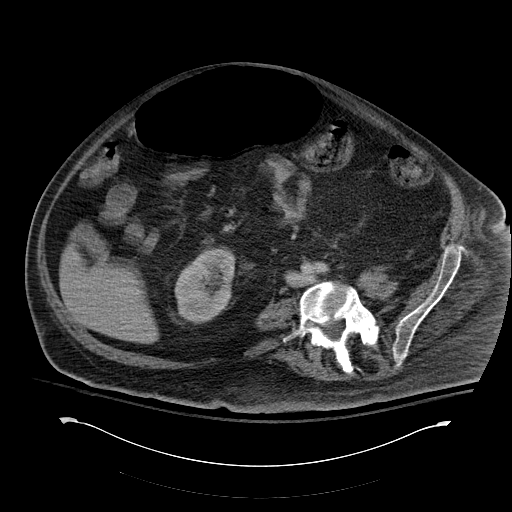
[im 52/56  soft-tissue]
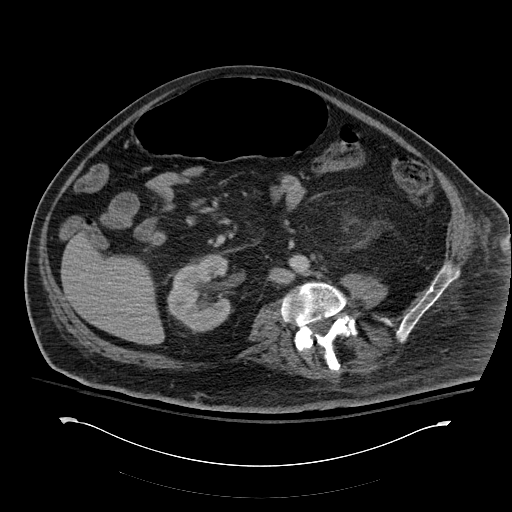

[Series 602: cor · coronal · 0.98mm/px · 3 of 176 slices shown]
[im 59/176  soft-tissue]
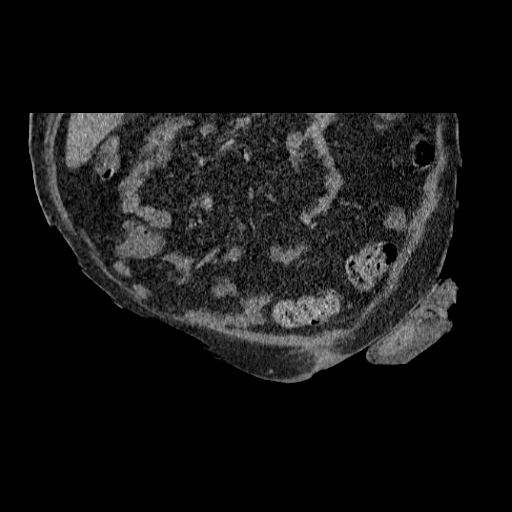
[im 78/176  soft-tissue]
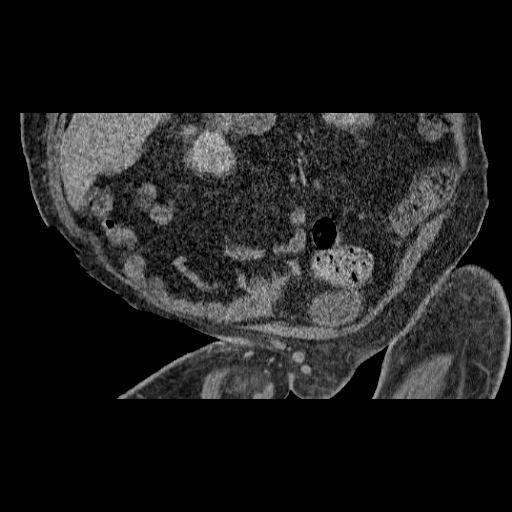
[im 98/176  soft-tissue]
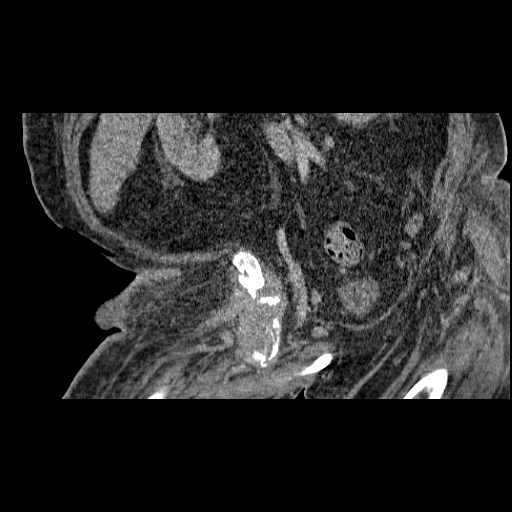

[17 of 46 positions shown; findings below may reference images not displayed]

FINDINGS: Large body habitus. Severe soft tissue deficiency along the lower
sacrum and coccyx has increased, but associated stranding and soft
tissue thickening appears diminished. Severe soft tissue deficiency
along the bilateral ischia appears not significantly changed. Severe
soft tissue wound involving the right hip with abundant
intra-articular gas, and destruction of the proximal right femur and
acetabulum. Sclerosis of the remaining bones and bone fragments at
this site without definite interval osseous destruction.

Chronic unusual focus of myositis ossifications or heterotopic
ossification tracking into the ventral right thigh and contiguous
with bone at the pubic symphysis incidentally re- identified.

The left hip and proximal femur are not as well visualized today due
to large body habitus, but chronically abnormal left acetabulum and
proximal femur appear to be stable.

Suprapubic catheter re- identified. No pelvic or lower abdominal
free fluid. Questionable trace free fluid versus motion artifact at
the tip of the liver. Left lower quadrant ostomy. No dilated bowel
loops. Intact visualized lumbar spine.
IMPRESSION: 1. Sequelae of severe soft tissue and bony infection along the
sacrum, both ischia, and at the right hip. Appearance now most
compatible with healed osteomyelitis and septic right hip arthritis.
No definite interval bone destruction.

2. Partially visible abnormal left hip also appears stable.

3. No new abnormality identified.

## 2015-09-10 ENCOUNTER — Telehealth: Payer: Self-pay | Admitting: Hematology

## 2015-09-10 NOTE — Telephone Encounter (Signed)
per pof to sch pt appt-cld pt and adv of appt time & date for 5/19

## 2015-09-12 IMAGING — CR DG ABDOMEN 1V
1 series · 2 of 2 positions shown · non-contrast
Comparison: CT 01/25/2013

CLINICAL DATA: Nausea and vomiting.

ABDOMEN - 1 VIEW

[Series 1: ap (kub) · U · 2 of 2 slices shown]
[im 1/2]
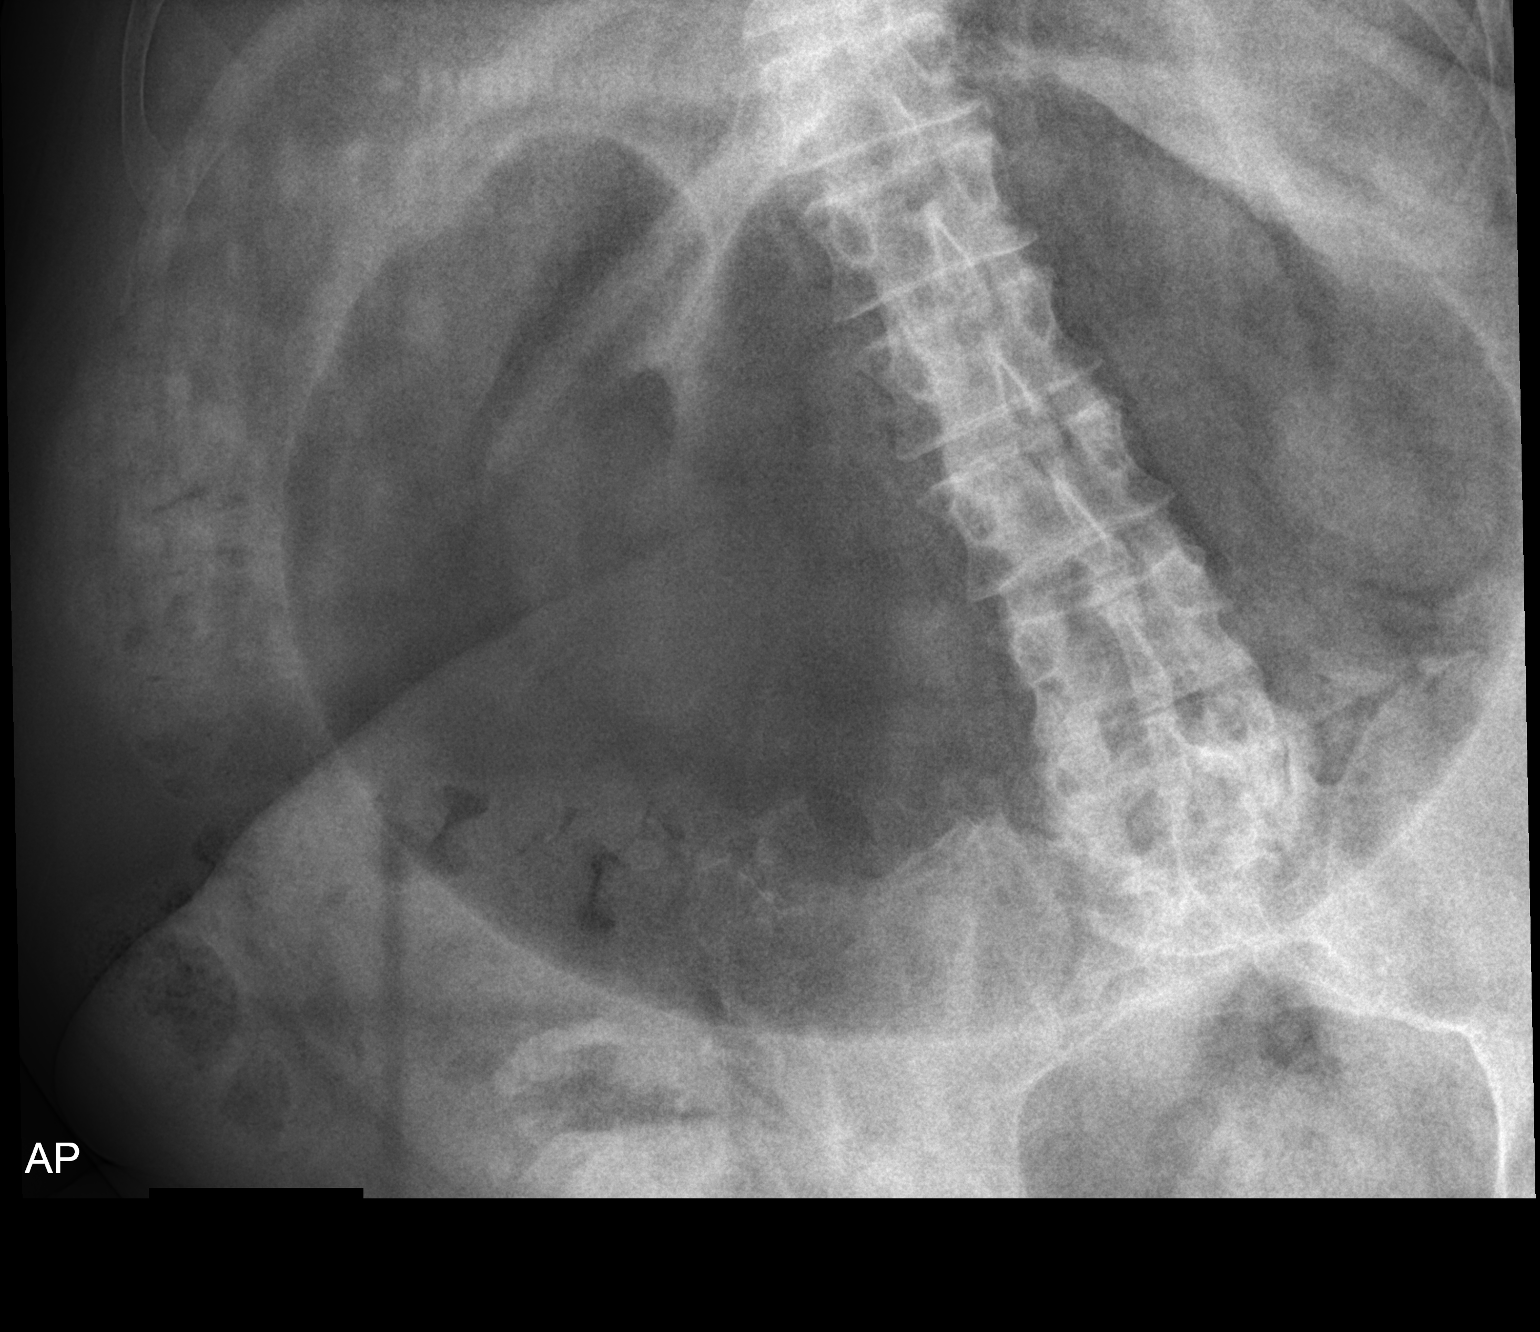
[im 2/2]
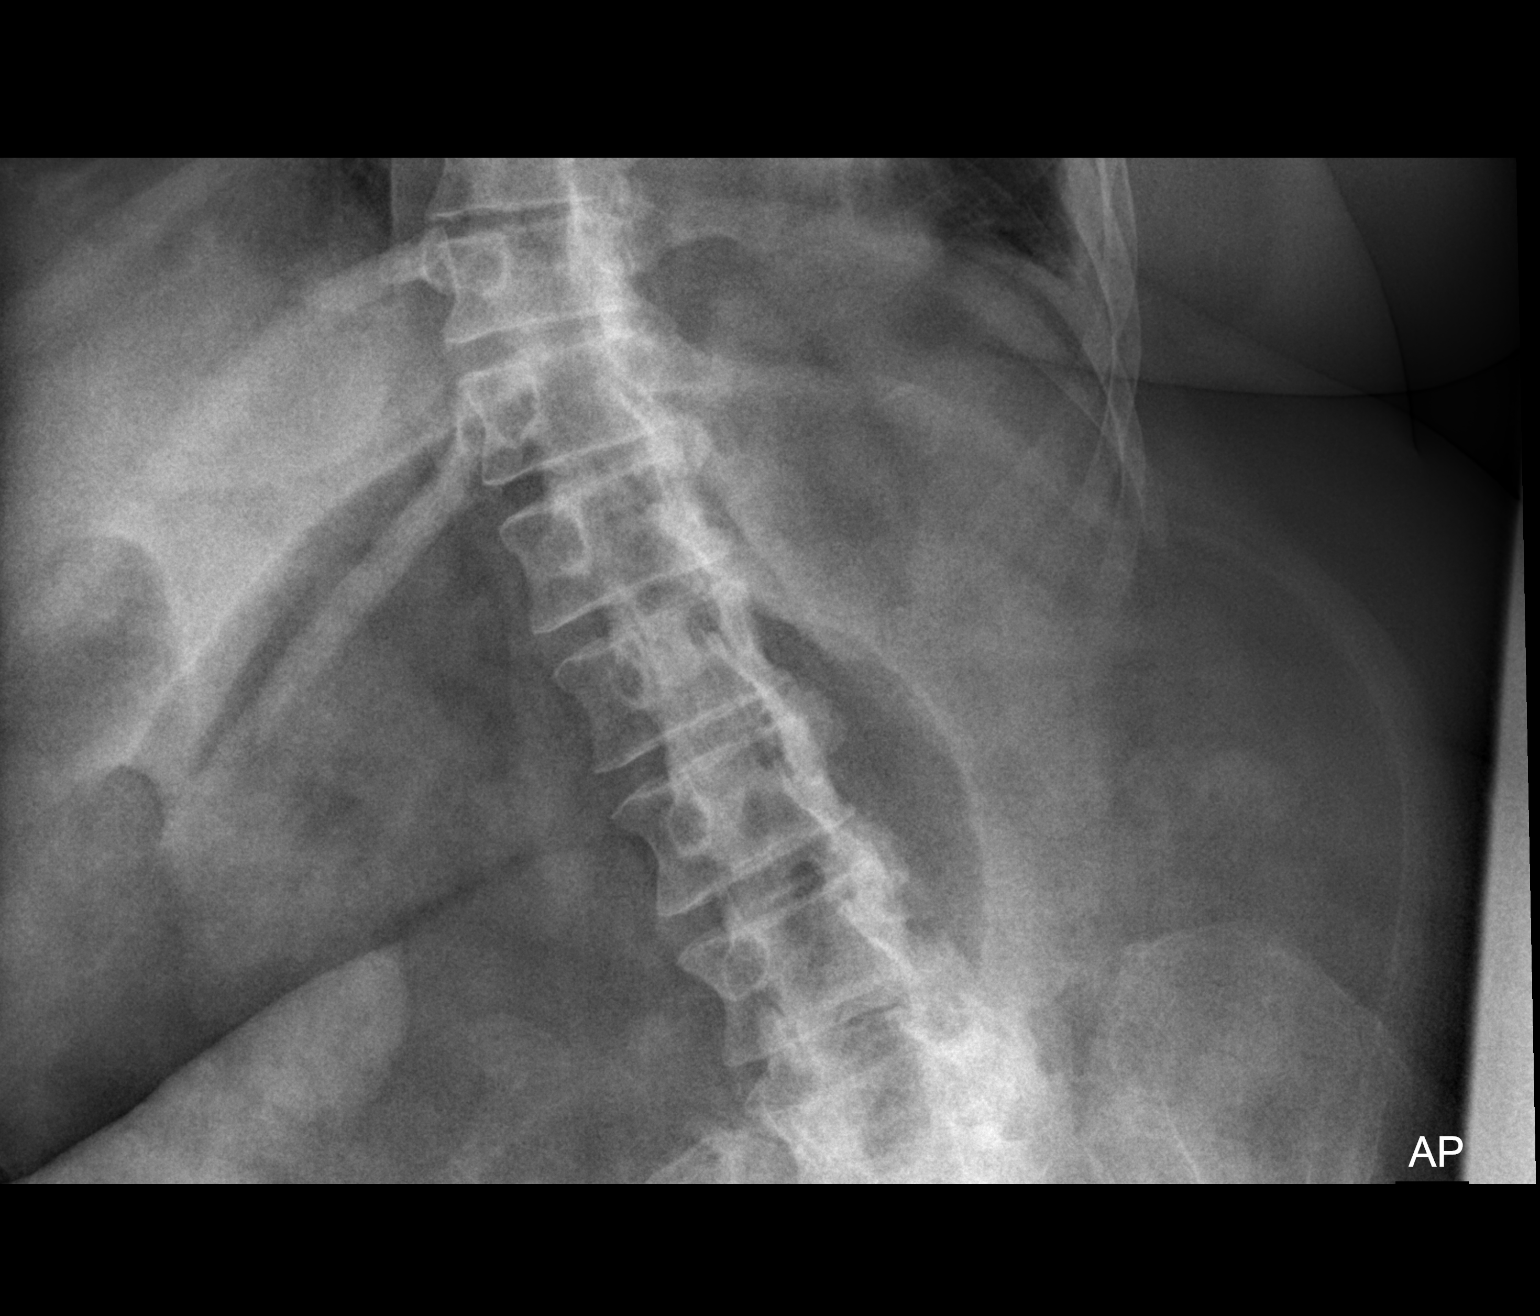

[2 of 2 positions shown; findings below may reference images not displayed]

FINDINGS: There is a large gas-filled structure in the mid abdomen.
This probably represents a large distended stomach.  Otherwise,
there is stool throughout the abdomen and pelvis.
IMPRESSION: Large gas-filled structure in the abdomen probably represents
massive distention of the stomach.

These results will be called to the ordering clinician or
representative by the Radiologist Assistant, and communication
documented in the PACS Dashboard.

## 2015-09-14 DIAGNOSIS — Z933 Colostomy status: Secondary | ICD-10-CM | POA: Diagnosis not present

## 2015-09-14 DIAGNOSIS — L8944 Pressure ulcer of contiguous site of back, buttock and hip, stage 4: Secondary | ICD-10-CM | POA: Diagnosis not present

## 2015-09-14 DIAGNOSIS — Z87440 Personal history of urinary (tract) infections: Secondary | ICD-10-CM | POA: Diagnosis not present

## 2015-09-14 DIAGNOSIS — Z452 Encounter for adjustment and management of vascular access device: Secondary | ICD-10-CM | POA: Diagnosis not present

## 2015-09-14 DIAGNOSIS — L8943 Pressure ulcer of contiguous site of back, buttock and hip, stage 3: Secondary | ICD-10-CM | POA: Diagnosis not present

## 2015-09-14 DIAGNOSIS — A419 Sepsis, unspecified organism: Secondary | ICD-10-CM | POA: Diagnosis not present

## 2015-09-14 DIAGNOSIS — I739 Peripheral vascular disease, unspecified: Secondary | ICD-10-CM | POA: Diagnosis not present

## 2015-09-14 IMAGING — CR DG ABDOMEN 1V
1 series · 2 of 2 positions shown · non-contrast
Comparison: 01/28/2013

CLINICAL DATA: Followup gastric Baudrit obstruction.

EXAM:
ABDOMEN - 1 VIEW

[Series 1: AP · U · 2 of 2 slices shown]
[im 1/2]
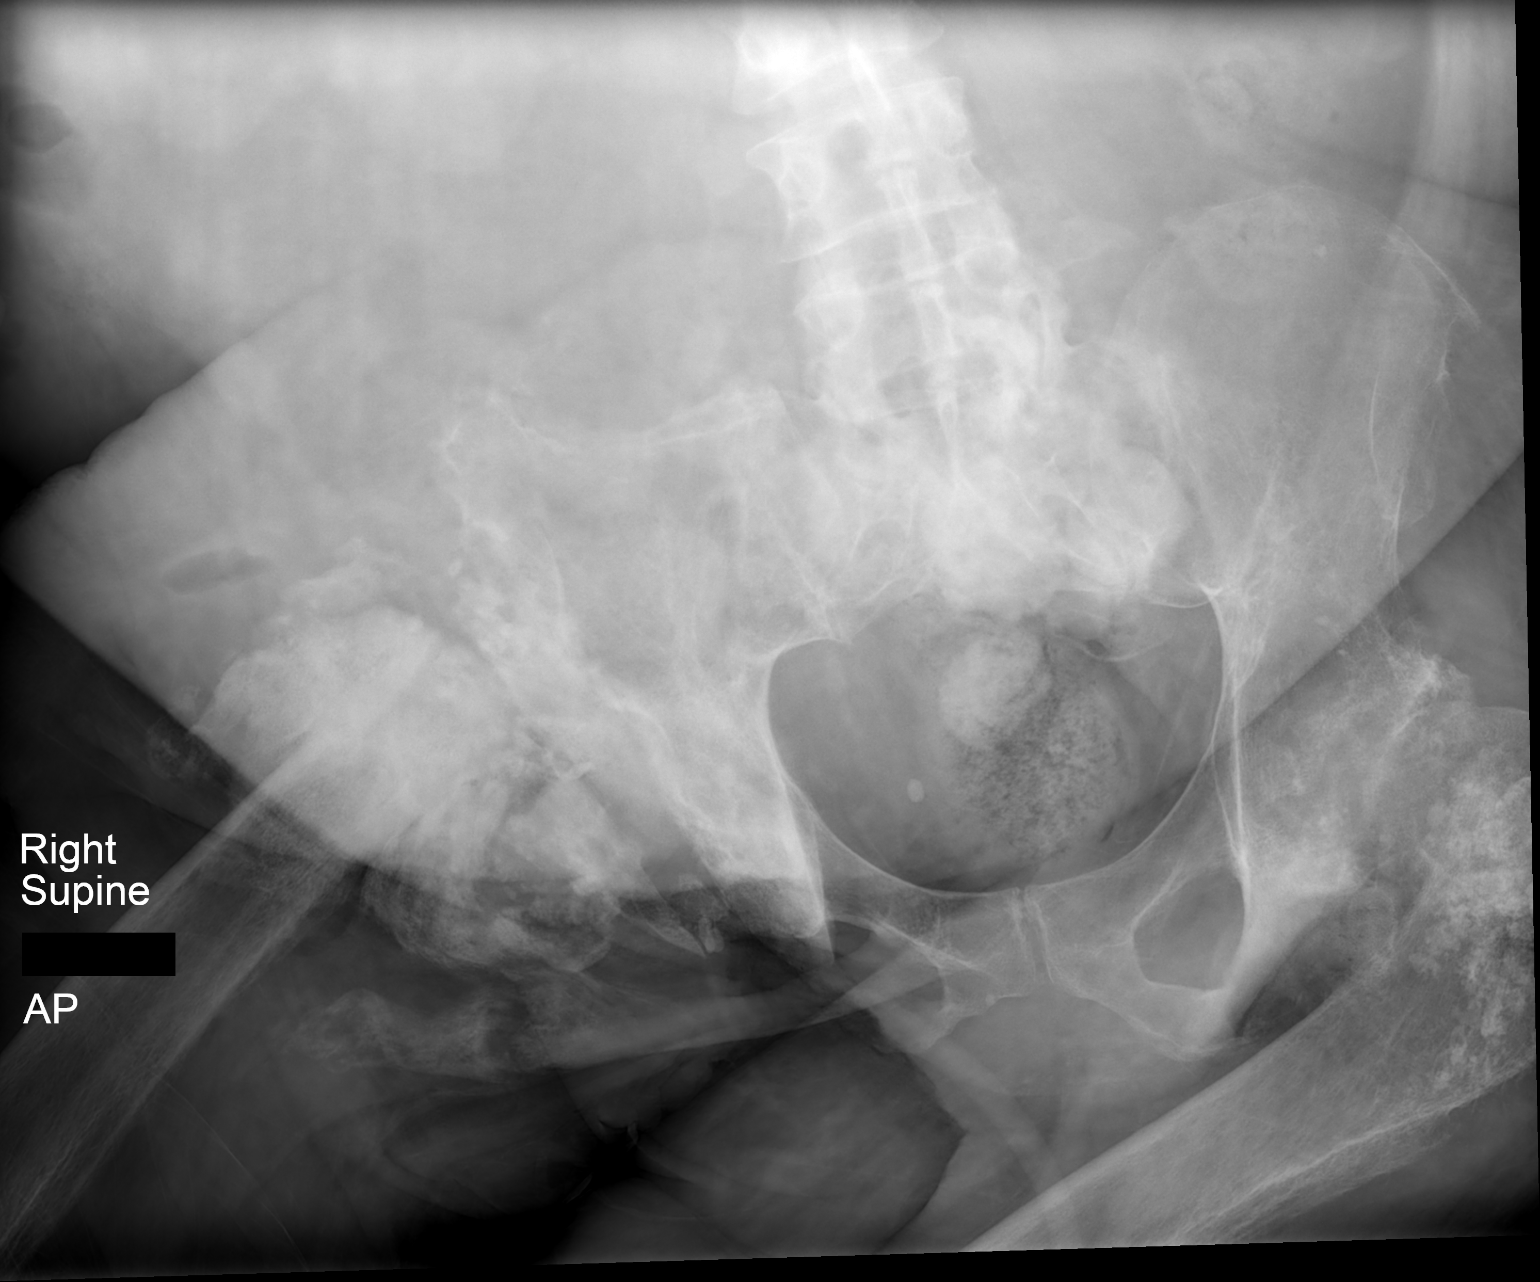
[im 2/2]
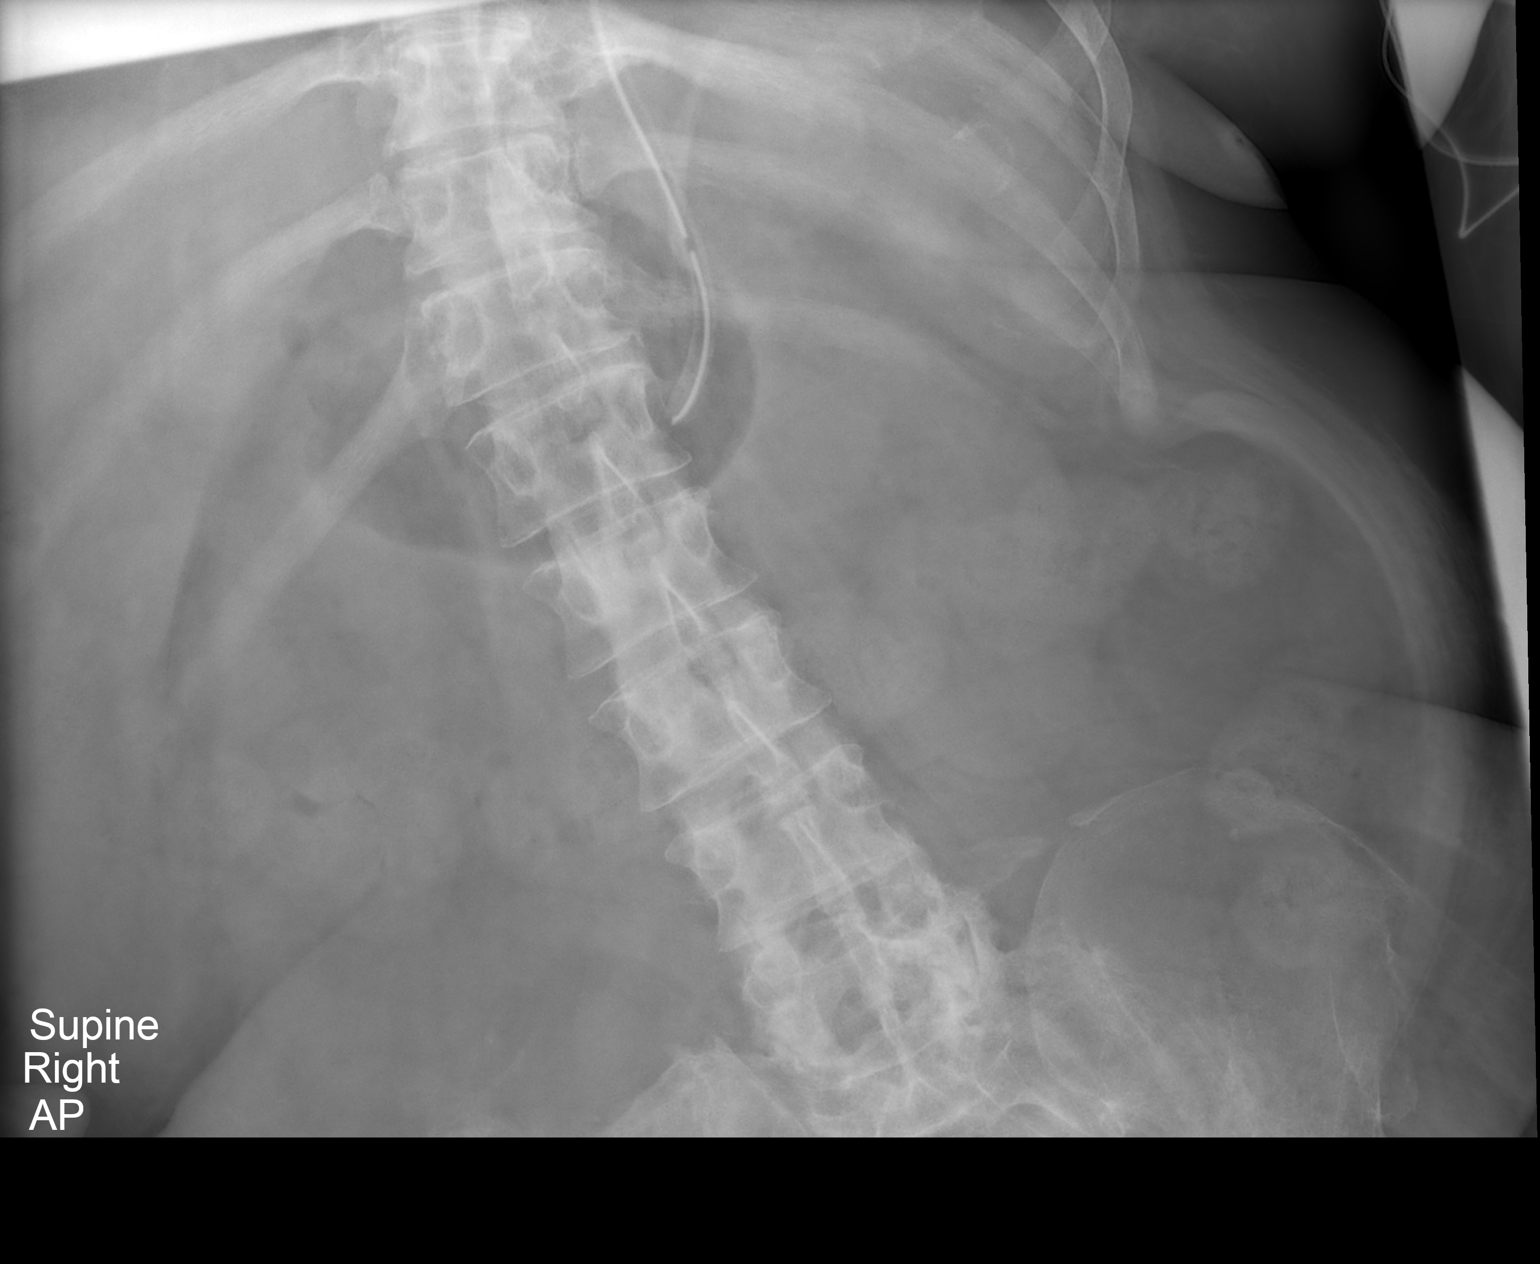

[2 of 2 positions shown; findings below may reference images not displayed]

FINDINGS: NG tube is present in the proximal to mid stomach. Significant
improvement in the previously seen gaseous distention of the
stomach. A small amount of gas is noted in the distal stomach.

Relative paucity of gas throughout the abdomen otherwise.

Severe deformity of the hips bilaterally with chronic dislocation
and dysplasia of the right hip. Prior resection of the right femoral
head. These findings are stable.
IMPRESSION: Improving gaseous distention of the stomach. Relative paucity of gas
throughout the remainder of the abdomen.

## 2015-09-15 ENCOUNTER — Telehealth: Payer: Self-pay | Admitting: *Deleted

## 2015-09-15 NOTE — Telephone Encounter (Signed)
I want him to go see his PCP and monitor his BG very closely

## 2015-09-15 NOTE — Telephone Encounter (Signed)
Received labs that have a critical low glucose of 39. Called the patient to check on him and he advised he feels fine. No dizziness, lightheadedness or lethargy. The patient advised he had eaten 4 hours prior to the labs being drawn. Advised him will let the doctor know and call him back if the doctor wants him to do anything differently.

## 2015-09-16 ENCOUNTER — Telehealth: Payer: Self-pay | Admitting: *Deleted

## 2015-09-16 DIAGNOSIS — A419 Sepsis, unspecified organism: Secondary | ICD-10-CM | POA: Diagnosis not present

## 2015-09-16 DIAGNOSIS — Z452 Encounter for adjustment and management of vascular access device: Secondary | ICD-10-CM | POA: Diagnosis not present

## 2015-09-16 NOTE — Telephone Encounter (Signed)
[name] at [facility] notified per [name] to repeat glucose ASAP due to low 39 on 09/14/15.  [name]

## 2015-09-18 NOTE — Telephone Encounter (Signed)
Patient informed he needs to see PCP and keep close watch on his glucose per Dr Tommy Medal.

## 2015-09-21 DIAGNOSIS — A419 Sepsis, unspecified organism: Secondary | ICD-10-CM | POA: Diagnosis not present

## 2015-09-23 ENCOUNTER — Encounter: Payer: Self-pay | Admitting: Infectious Diseases

## 2015-09-23 DIAGNOSIS — L89154 Pressure ulcer of sacral region, stage 4: Secondary | ICD-10-CM | POA: Diagnosis not present

## 2015-09-23 DIAGNOSIS — L8943 Pressure ulcer of contiguous site of back, buttock and hip, stage 3: Secondary | ICD-10-CM | POA: Diagnosis not present

## 2015-09-23 DIAGNOSIS — S14109S Unspecified injury at unspecified level of cervical spinal cord, sequela: Secondary | ICD-10-CM | POA: Diagnosis not present

## 2015-09-23 DIAGNOSIS — L89893 Pressure ulcer of other site, stage 3: Secondary | ICD-10-CM | POA: Diagnosis not present

## 2015-09-23 DIAGNOSIS — Z452 Encounter for adjustment and management of vascular access device: Secondary | ICD-10-CM | POA: Diagnosis not present

## 2015-09-23 DIAGNOSIS — L89624 Pressure ulcer of left heel, stage 4: Secondary | ICD-10-CM | POA: Diagnosis not present

## 2015-09-23 DIAGNOSIS — A419 Sepsis, unspecified organism: Secondary | ICD-10-CM | POA: Diagnosis not present

## 2015-09-23 DIAGNOSIS — L89610 Pressure ulcer of right heel, unstageable: Secondary | ICD-10-CM | POA: Diagnosis not present

## 2015-09-23 DIAGNOSIS — G8253 Quadriplegia, C5-C7 complete: Secondary | ICD-10-CM | POA: Diagnosis not present

## 2015-09-23 DIAGNOSIS — L89890 Pressure ulcer of other site, unstageable: Secondary | ICD-10-CM | POA: Diagnosis not present

## 2015-09-23 DIAGNOSIS — L8944 Pressure ulcer of contiguous site of back, buttock and hip, stage 4: Secondary | ICD-10-CM | POA: Diagnosis not present

## 2015-09-26 DIAGNOSIS — T8189XA Other complications of procedures, not elsewhere classified, initial encounter: Secondary | ICD-10-CM | POA: Diagnosis not present

## 2015-09-26 LAB — ACID FAST CULTURE WITH REFLEXED SENSITIVITIES (MYCOBACTERIA): Acid Fast Culture: NEGATIVE

## 2015-09-26 LAB — ACID FAST CULTURE WITH REFLEXED SENSITIVITIES

## 2015-09-28 DIAGNOSIS — L8944 Pressure ulcer of contiguous site of back, buttock and hip, stage 4: Secondary | ICD-10-CM | POA: Diagnosis not present

## 2015-09-28 DIAGNOSIS — Z452 Encounter for adjustment and management of vascular access device: Secondary | ICD-10-CM | POA: Diagnosis not present

## 2015-09-28 DIAGNOSIS — I739 Peripheral vascular disease, unspecified: Secondary | ICD-10-CM | POA: Diagnosis not present

## 2015-09-28 DIAGNOSIS — A419 Sepsis, unspecified organism: Secondary | ICD-10-CM | POA: Diagnosis not present

## 2015-09-30 ENCOUNTER — Encounter: Payer: Self-pay | Admitting: Infectious Diseases

## 2015-09-30 ENCOUNTER — Telehealth: Payer: Self-pay | Admitting: *Deleted

## 2015-09-30 ENCOUNTER — Ambulatory Visit (INDEPENDENT_AMBULATORY_CARE_PROVIDER_SITE_OTHER): Payer: Medicare Other | Admitting: Infectious Diseases

## 2015-09-30 VITALS — BP 94/70 | HR 98 | Temp 98.0°F

## 2015-09-30 DIAGNOSIS — L8994 Pressure ulcer of unspecified site, stage 4: Secondary | ICD-10-CM | POA: Diagnosis not present

## 2015-09-30 DIAGNOSIS — M4624 Osteomyelitis of vertebra, thoracic region: Secondary | ICD-10-CM
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
## 2015-09-30 NOTE — Telephone Encounter (Signed)
Verbal order given to [facility] at [place] to pull patient's picc line in one week after he uses the rest of antibiotic he has at home. Patient also has a written order from [name] for his nurse. [name]

## 2015-09-30 NOTE — Assessment & Plan Note (Signed)
Will have hm finnish his atbx he has at home (this is 1 more week post the original planned atbx).  He will then have PIC pulled by home health (a note is written for them).  He will f/u with surgery/plastics. We will see him back in 1 month

## 2015-10-02 ENCOUNTER — Ambulatory Visit (HOSPITAL_BASED_OUTPATIENT_CLINIC_OR_DEPARTMENT_OTHER): Payer: Medicare Other | Admitting: Hematology

## 2015-10-02 ENCOUNTER — Encounter: Payer: Self-pay | Admitting: Hematology

## 2015-10-02 ENCOUNTER — Encounter (HOSPITAL_BASED_OUTPATIENT_CLINIC_OR_DEPARTMENT_OTHER): Payer: Medicare Other | Attending: Internal Medicine

## 2015-10-02 ENCOUNTER — Ambulatory Visit (HOSPITAL_BASED_OUTPATIENT_CLINIC_OR_DEPARTMENT_OTHER): Payer: Medicare Other

## 2015-10-02 ENCOUNTER — Telehealth: Payer: Self-pay | Admitting: Hematology

## 2015-10-02 ENCOUNTER — Other Ambulatory Visit (HOSPITAL_BASED_OUTPATIENT_CLINIC_OR_DEPARTMENT_OTHER): Payer: Medicare Other

## 2015-10-02 VITALS — BP 121/81 | HR 80 | Temp 98.2°F | Resp 18

## 2015-10-02 DIAGNOSIS — L97919 Non-pressure chronic ulcer of unspecified part of right lower leg with unspecified severity: Secondary | ICD-10-CM | POA: Insufficient documentation

## 2015-10-02 DIAGNOSIS — D638 Anemia in other chronic diseases classified elsewhere: Secondary | ICD-10-CM | POA: Diagnosis not present

## 2015-10-02 DIAGNOSIS — L89894 Pressure ulcer of other site, stage 4: Secondary | ICD-10-CM | POA: Diagnosis not present

## 2015-10-02 DIAGNOSIS — G4730 Sleep apnea, unspecified: Secondary | ICD-10-CM | POA: Insufficient documentation

## 2015-10-02 DIAGNOSIS — M8618 Other acute osteomyelitis, other site: Secondary | ICD-10-CM | POA: Diagnosis not present

## 2015-10-02 DIAGNOSIS — G40909 Epilepsy, unspecified, not intractable, without status epilepticus: Secondary | ICD-10-CM | POA: Insufficient documentation

## 2015-10-02 DIAGNOSIS — I10 Essential (primary) hypertension: Secondary | ICD-10-CM | POA: Diagnosis not present

## 2015-10-02 DIAGNOSIS — L89324 Pressure ulcer of left buttock, stage 4: Secondary | ICD-10-CM | POA: Insufficient documentation

## 2015-10-02 DIAGNOSIS — M86659 Other chronic osteomyelitis, unspecified thigh: Secondary | ICD-10-CM

## 2015-10-02 DIAGNOSIS — D473 Essential (hemorrhagic) thrombocythemia: Secondary | ICD-10-CM

## 2015-10-02 DIAGNOSIS — D509 Iron deficiency anemia, unspecified: Secondary | ICD-10-CM

## 2015-10-02 DIAGNOSIS — G8250 Quadriplegia, unspecified: Secondary | ICD-10-CM | POA: Insufficient documentation

## 2015-10-02 DIAGNOSIS — L89154 Pressure ulcer of sacral region, stage 4: Secondary | ICD-10-CM | POA: Diagnosis not present

## 2015-10-02 DIAGNOSIS — B965 Pseudomonas (aeruginosa) (mallei) (pseudomallei) as the cause of diseases classified elsewhere: Secondary | ICD-10-CM | POA: Insufficient documentation

## 2015-10-02 DIAGNOSIS — M869 Osteomyelitis, unspecified: Secondary | ICD-10-CM

## 2015-10-02 DIAGNOSIS — I87331 Chronic venous hypertension (idiopathic) with ulcer and inflammation of right lower extremity: Secondary | ICD-10-CM | POA: Diagnosis not present

## 2015-10-02 DIAGNOSIS — D649 Anemia, unspecified: Secondary | ICD-10-CM

## 2015-10-02 DIAGNOSIS — L97811 Non-pressure chronic ulcer of other part of right lower leg limited to breakdown of skin: Secondary | ICD-10-CM | POA: Diagnosis not present

## 2015-10-02 DIAGNOSIS — L89224 Pressure ulcer of left hip, stage 4: Secondary | ICD-10-CM | POA: Diagnosis not present

## 2015-10-02 DIAGNOSIS — L89890 Pressure ulcer of other site, unstageable: Secondary | ICD-10-CM | POA: Insufficient documentation

## 2015-10-02 LAB — CBC & DIFF AND RETIC
BASO%: 0.7 % (ref 0.0–2.0)
Basophils Absolute: 0 10*3/uL (ref 0.0–0.1)
EOS%: 10.2 % — ABNORMAL HIGH (ref 0.0–7.0)
Eosinophils Absolute: 0.6 10*3/uL — ABNORMAL HIGH (ref 0.0–0.5)
HEMATOCRIT: 33 % — AB (ref 38.4–49.9)
HGB: 10.2 g/dL — ABNORMAL LOW (ref 13.0–17.1)
Immature Retic Fract: 6.7 % (ref 3.00–10.60)
LYMPH#: 1.5 10*3/uL (ref 0.9–3.3)
LYMPH%: 25.9 % (ref 14.0–49.0)
MCH: 24.2 pg — ABNORMAL LOW (ref 27.2–33.4)
MCHC: 30.9 g/dL — AB (ref 32.0–36.0)
MCV: 78.4 fL — ABNORMAL LOW (ref 79.3–98.0)
MONO#: 1 10*3/uL — AB (ref 0.1–0.9)
MONO%: 16.7 % — ABNORMAL HIGH (ref 0.0–14.0)
NEUT#: 2.7 10*3/uL (ref 1.5–6.5)
NEUT%: 46.5 % (ref 39.0–75.0)
Platelets: 391 10*3/uL (ref 140–400)
RBC: 4.21 10*6/uL (ref 4.20–5.82)
RDW: 17.2 % — ABNORMAL HIGH (ref 11.0–14.6)
RETIC %: 1.47 % (ref 0.80–1.80)
RETIC CT ABS: 61.89 10*3/uL (ref 34.80–93.90)
WBC: 5.9 10*3/uL (ref 4.0–10.3)
nRBC: 0 % (ref 0–0)

## 2015-10-02 LAB — IRON AND TIBC
%SAT: 14 % — AB (ref 20–55)
IRON: 19 ug/dL — AB (ref 42–163)
TIBC: 131 ug/dL — ABNORMAL LOW (ref 202–409)
UIBC: 113 ug/dL — ABNORMAL LOW (ref 117–376)

## 2015-10-02 LAB — FERRITIN: Ferritin: 1248 ng/ml — ABNORMAL HIGH (ref 22–316)

## 2015-10-02 MED ORDER — DARBEPOETIN ALFA 100 MCG/0.5ML IJ SOSY
100.0000 ug | PREFILLED_SYRINGE | Freq: Once | INTRAMUSCULAR | Status: AC
  Administered 2015-10-02: 100 ug via SUBCUTANEOUS
  Filled 2015-10-02: qty 0.5

## 2015-10-02 NOTE — Telephone Encounter (Signed)
Gave pt apt & avs °
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
## 2015-10-05 ENCOUNTER — Encounter: Payer: Self-pay | Admitting: Infectious Diseases

## 2015-10-05 DIAGNOSIS — A419 Sepsis, unspecified organism: Secondary | ICD-10-CM | POA: Diagnosis not present

## 2015-10-05 LAB — ACID FAST CULTURE WITH REFLEXED SENSITIVITIES

## 2015-10-05 LAB — ACID FAST CULTURE WITH REFLEXED SENSITIVITIES (MYCOBACTERIA): Acid Fast Culture: NEGATIVE

## 2015-10-06 IMAGING — CT CT PELVIS W/ CM
1 series · 14 of 32 positions shown, 18 images · IV contrast (omnipaque)
Comparison: CT of the pelvis performed 01/25/2013

CLINICAL DATA: Poorly healing bilateral sacral wounds; evaluate
wounds.

CT PELVIS WITH CONTRAST
TECHNIQUE: Multidetector CT imaging of the pelvis was performed
using the standard protocol following the bolus administration of
intravenous contrast.
Contrast: 100mL OMNIPAQUE IOHEXOL 300 MG/ML  SOLN

[Series 3: pelvis with · axial · 0.97mm/px · z∈[-610,-340]mm · 14 of 60 slices shown, 18 images]
[im 4/60  soft-tissue]
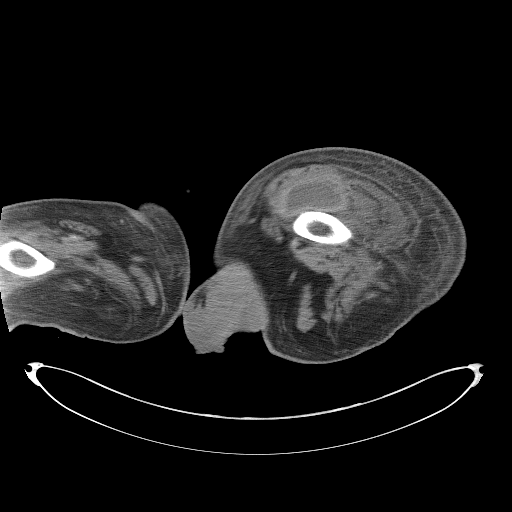
[im 4/60  bone]
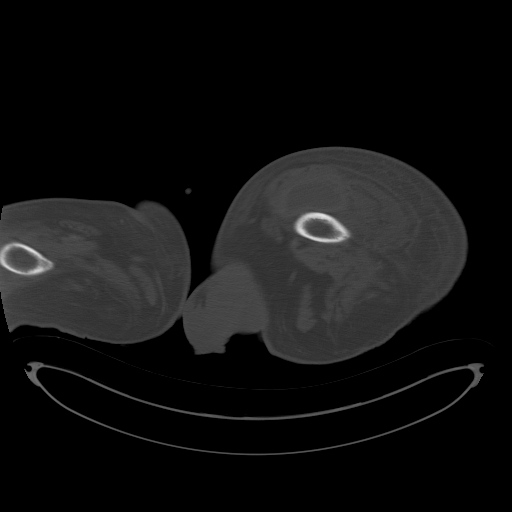
[im 8/60  soft-tissue]
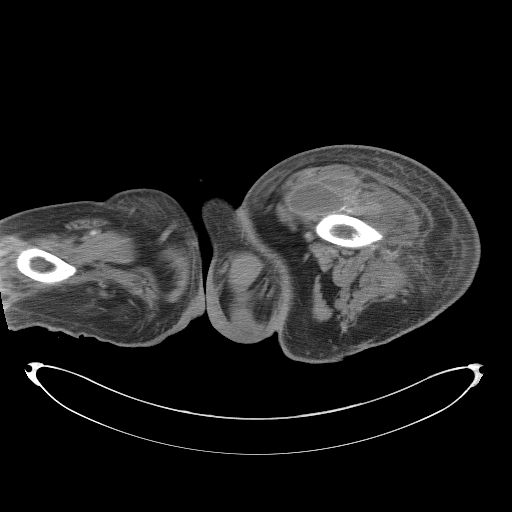
[im 14/60  soft-tissue]
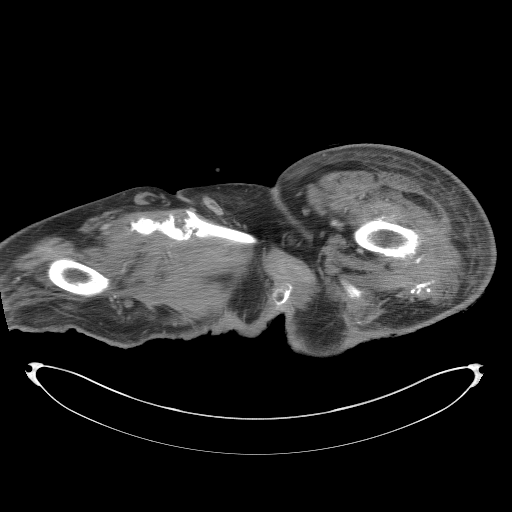
[im 18/60  soft-tissue]
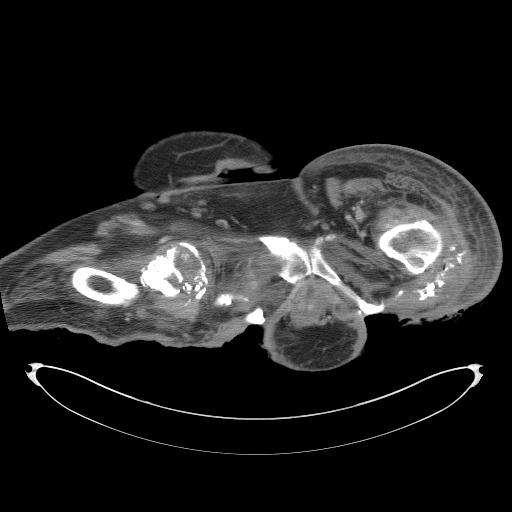
[im 23/60  soft-tissue]
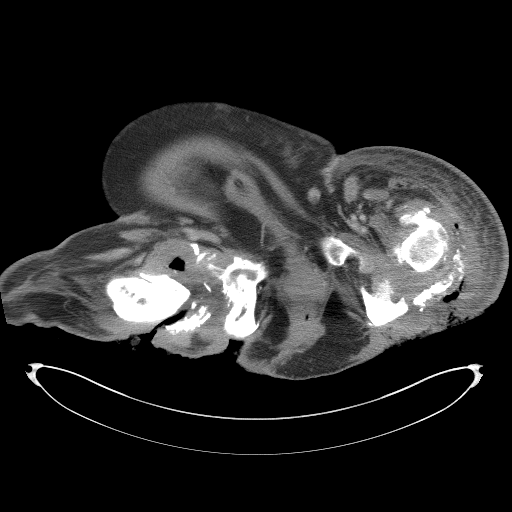
[im 27/60  soft-tissue]
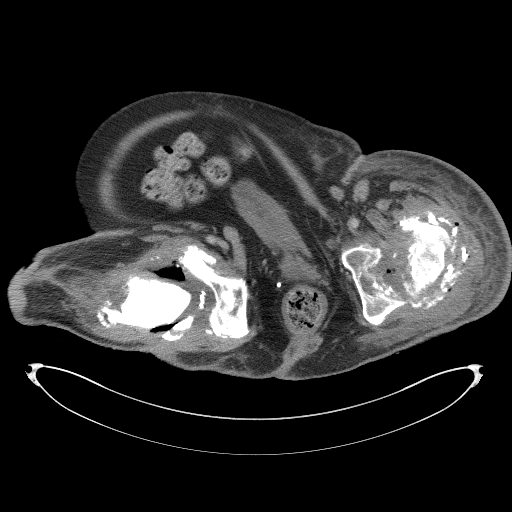
[im 33/60  soft-tissue]
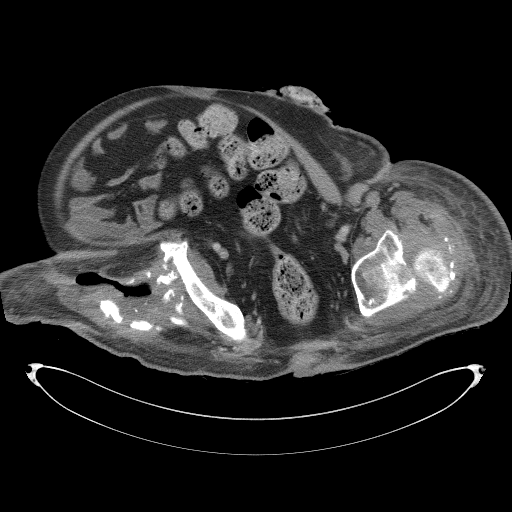
[im 37/60  soft-tissue]
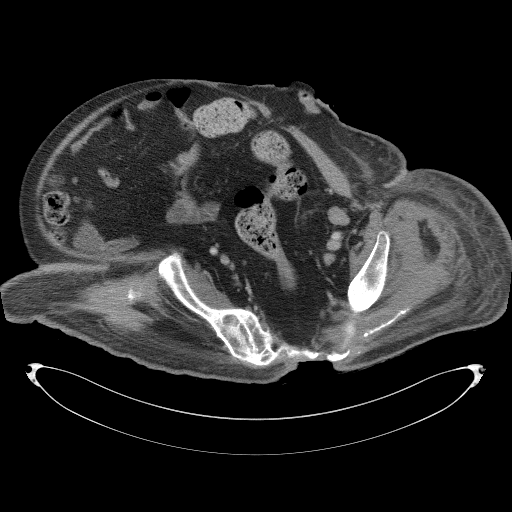
[im 42/60  soft-tissue]
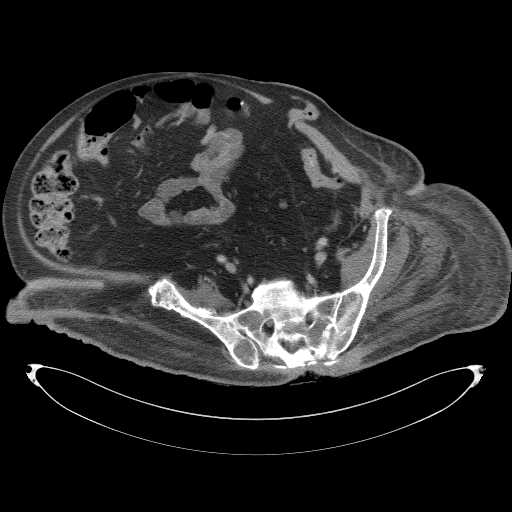
[im 42/60  bone]
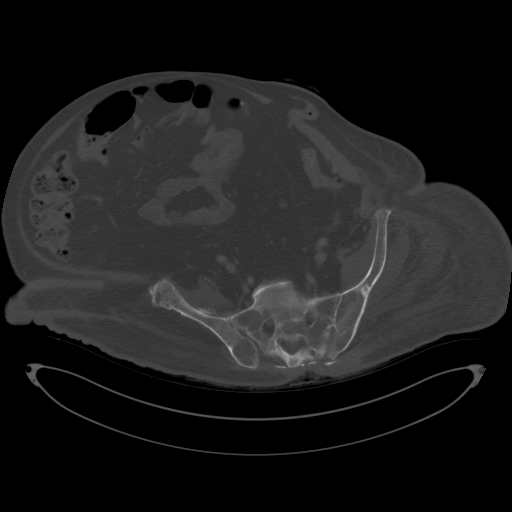
[im 46/60  soft-tissue]
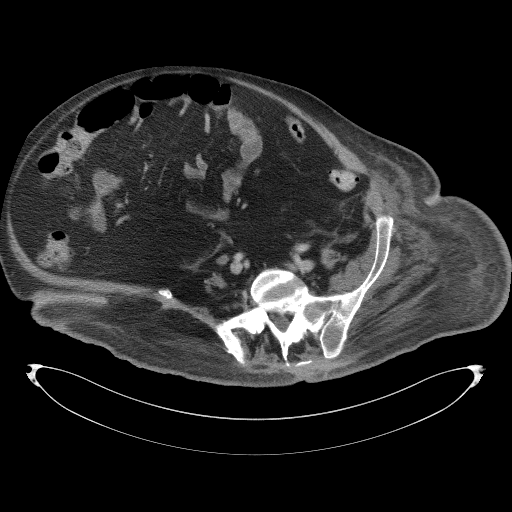
[im 52/60  soft-tissue]
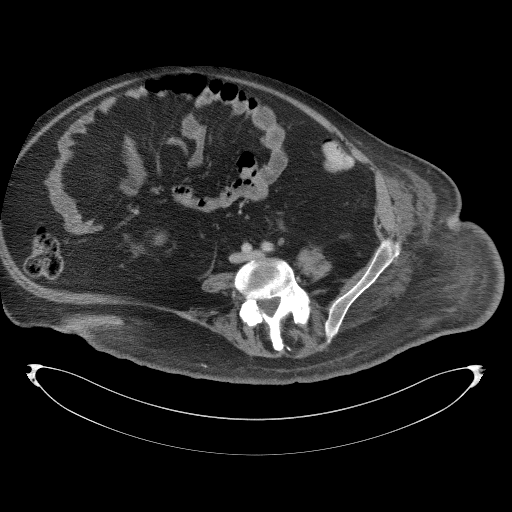
[im 52/60  lung]
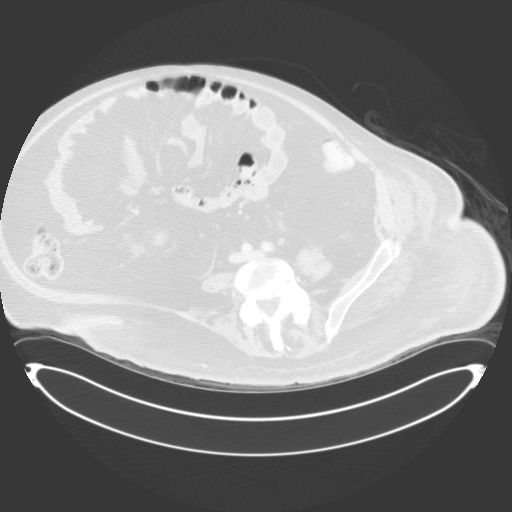
[im 54/60  lung]
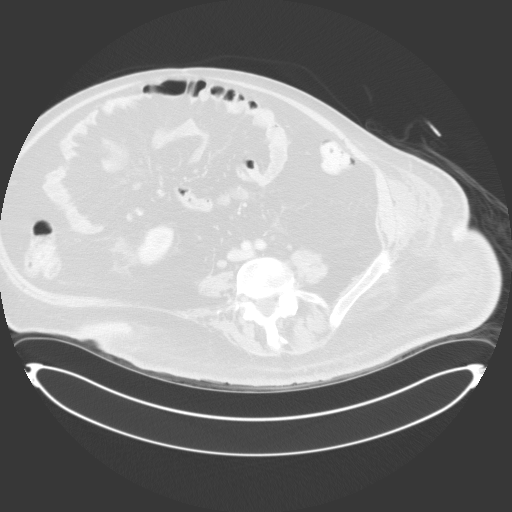
[im 56/60  soft-tissue]
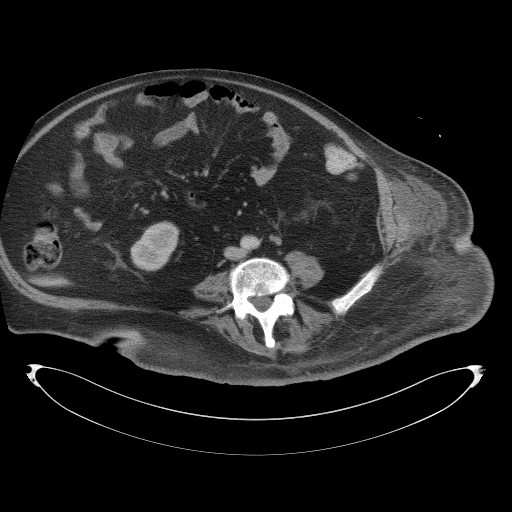
[im 56/60  lung]
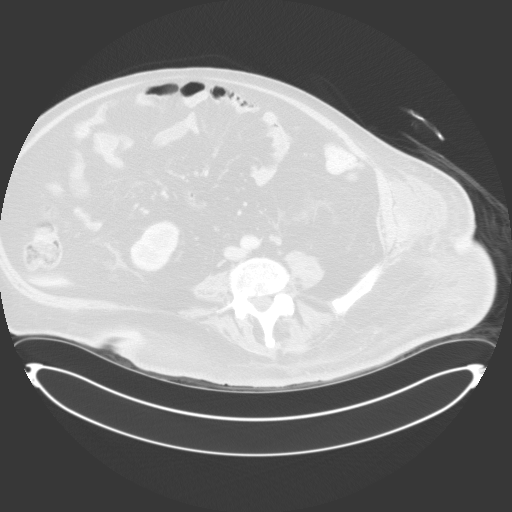
[im 58/60  lung]
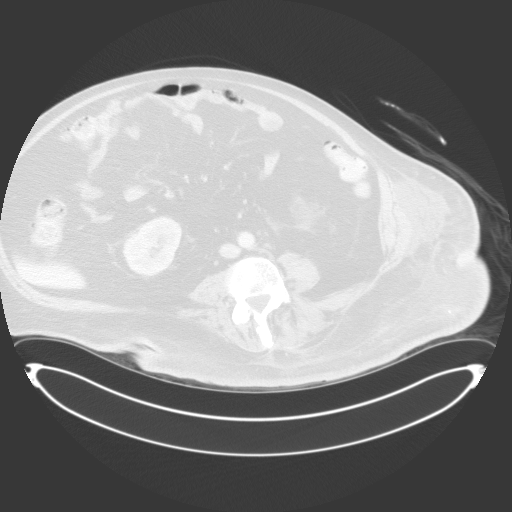

[14 of 32 positions shown; findings below may reference images not displayed]

FINDINGS: Since the prior study, there has been interval
development of a somewhat complex peripherally enhancing abscess
within the anterior left thigh, measuring approximately 6.8 x 3.6 x
5.9 cm.  The degree of soft tissue inflammation within the proximal
left thigh has also increased, reflecting cellulitis.

Note is also made of new tiny foci of air at the left hip joint
space, compatible with new extension of the patient's left-sided
sacral decubitus ulcers; the degree of soft tissue inflammation at
the left hip is largely unchanged, though more peripheral
surrounding soft tissue edema appears worsened.

The right hip joint is unchanged in appearance, with a large sacral
decubitus ulceration tracking through the hip joint and into a
large pocket of air along the anterior right thigh.  There is
diffuse destruction of both hip joints, with extensive deformity as
previously described.  An unusual osseous extension is again noted
arising from the right side of the pubic symphysis, into the
anterior right thigh; this appears to reflect a displaced coccyx.

Decubitus ulcerations along both posterior acetabula extend to the
bone, as noted on the prior study.  The decubitus ulceration at the
sacrum is also unchanged, extending nearly to the bone.

The visualized small and large bowel are grossly unremarkable in
appearance.  The bladder is decompressed, with a suprapubic
catheter noted in unchanged position.
IMPRESSION: 1.  New 6.8 x 3.6 x 5.9 cm peripherally enhancing abscess in the
anterior left thigh, with new cellulitis involving much of the left
thigh.
2.  New tiny foci of air noted at the left hip joint space,
compatible with new extension of the left-sided sacral decubitus
ulcers; surrounding soft tissue edema appears worsened.
3.  Very large right-sided sacral decubitus ulceration unchanged in
appearance, with air tracking through the right hip joint and into
the anterior right thigh.  Decubitus ulcerations along both
posterior acetabula and at the sacrum are essentially unchanged.

to the clinical team at [[email or web address]], who verbally acknowledged
these results.

## 2015-10-07 DIAGNOSIS — N312 Flaccid neuropathic bladder, not elsewhere classified: Secondary | ICD-10-CM | POA: Diagnosis not present

## 2015-10-07 DIAGNOSIS — N3289 Other specified disorders of bladder: Secondary | ICD-10-CM | POA: Diagnosis not present

## 2015-10-07 IMAGING — US US ABSCESS DRAINAGE W/ CATHETER
1 series · 12 of 12 positions shown · non-contrast
Comparison: none

CLINICAL DATA: Quadriplegia and development of abscess in the
anterior left thigh.

[Series 1: us abscess drainage w/ catheter · 0.14mm/px · 12 of 12 slices shown]
[im 1/12]
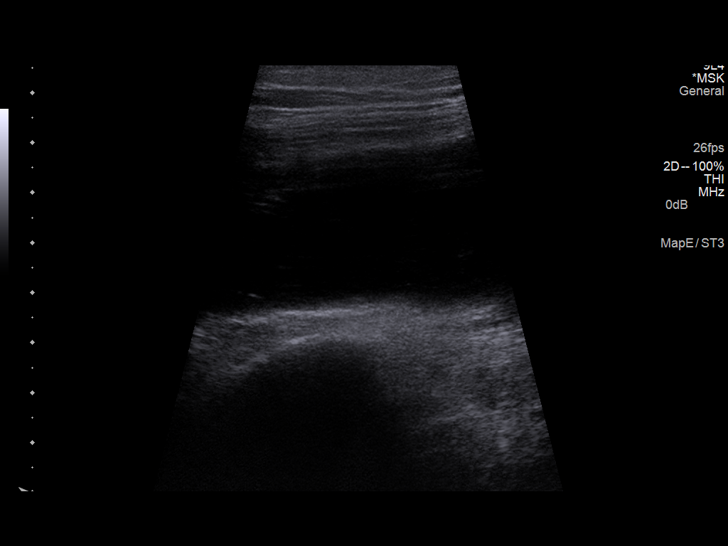
[im 2/12]
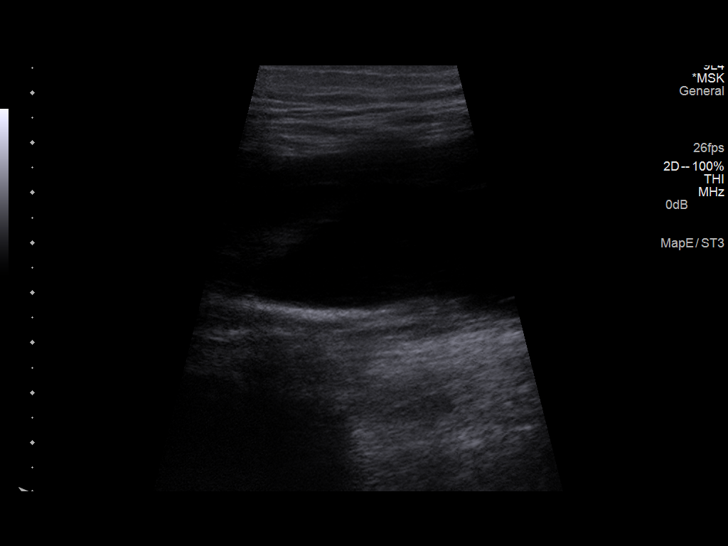
[im 3/12]
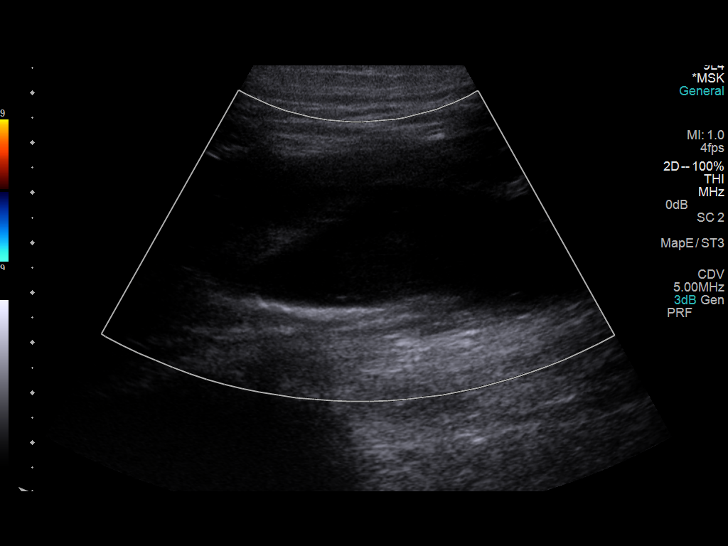
[im 4/12]
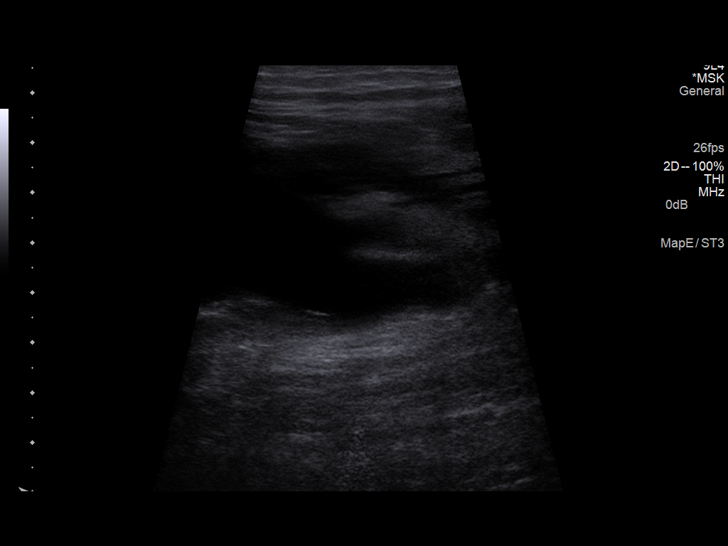
[im 5/12]
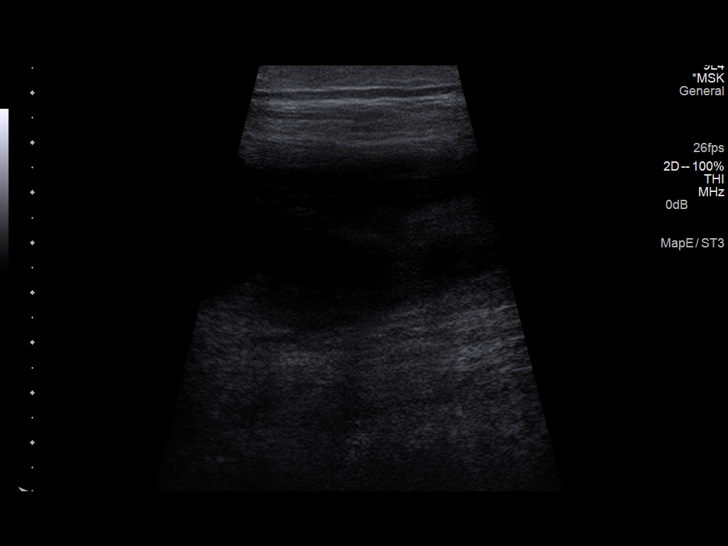
[im 6/12]
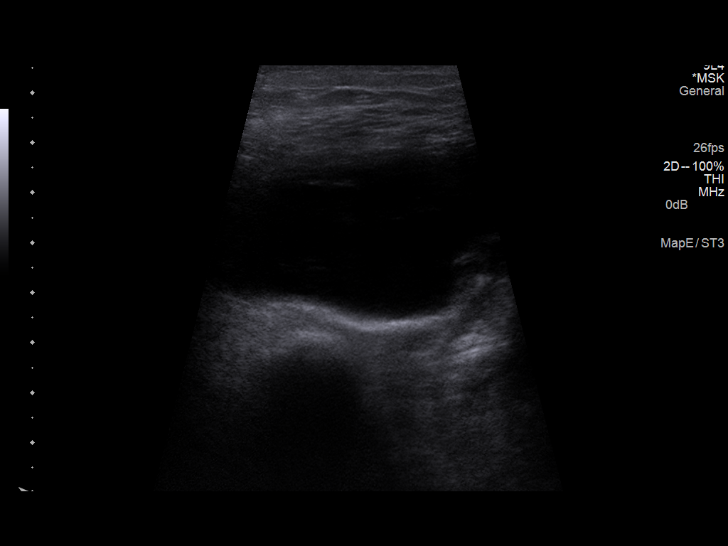
[im 7/12]
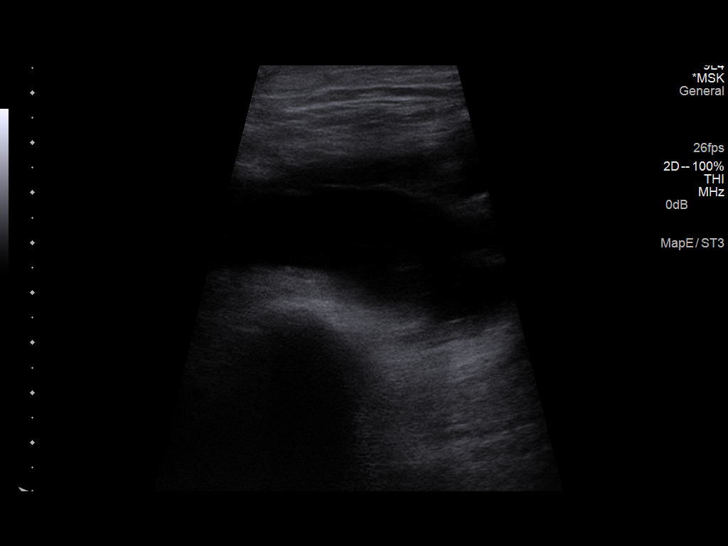
[im 8/12]
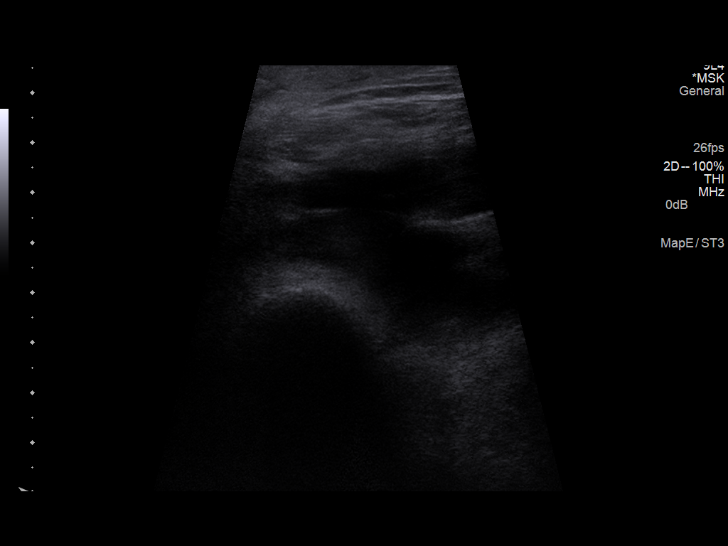
[im 9/12]
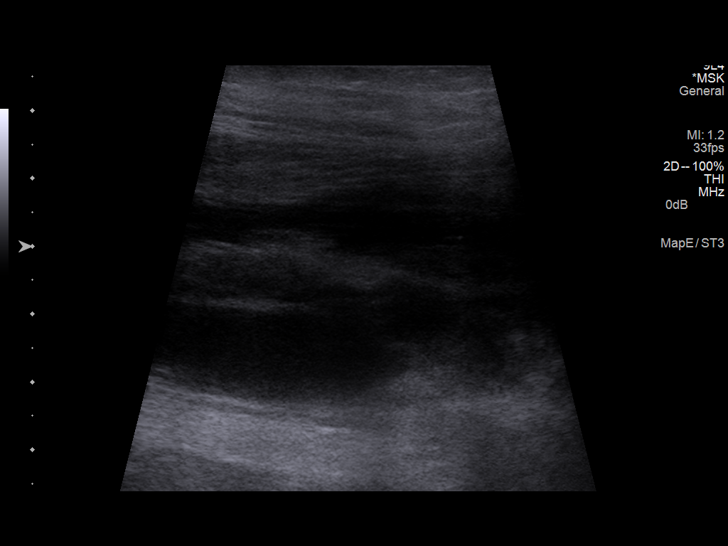
[im 10/12]
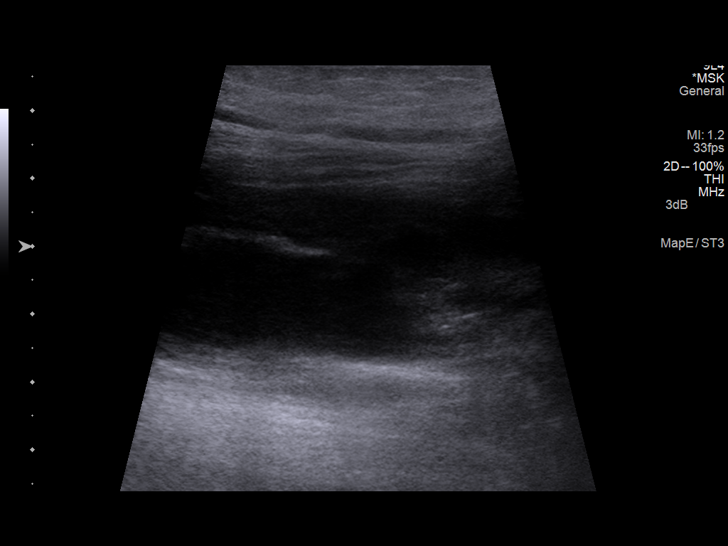
[im 11/12]
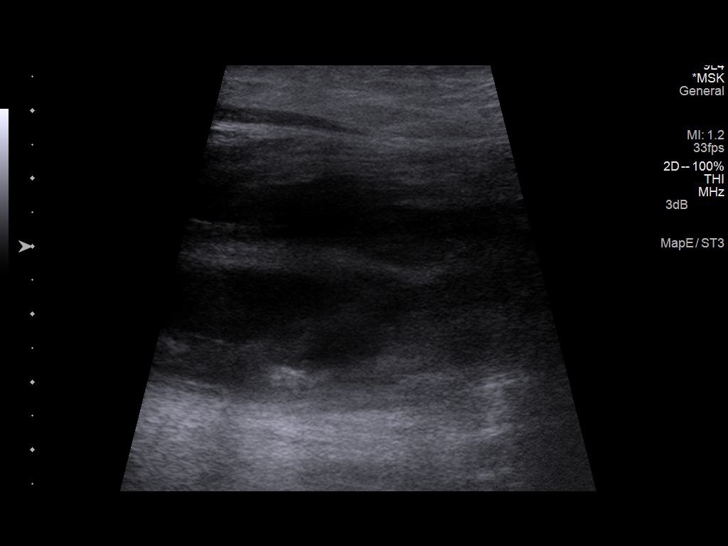
[im 12/12]
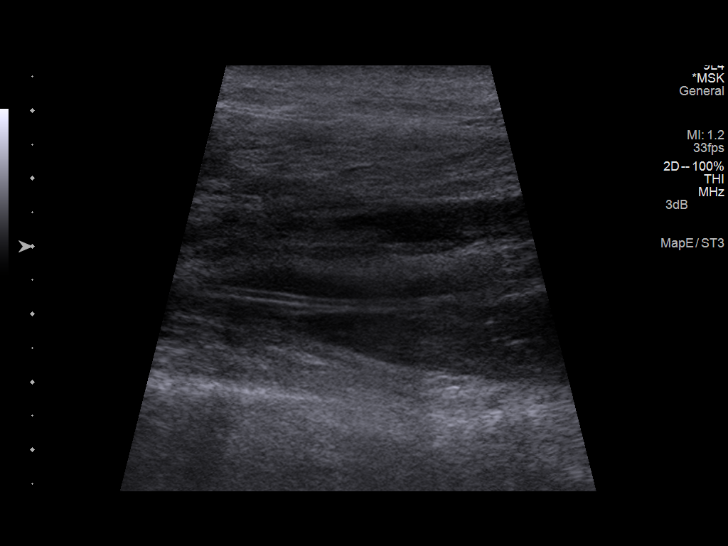

[12 of 12 positions shown; findings below may reference images not displayed]

EXAM:
ULTRASOUND GUIDED DRAINAGE OF THE LEFT THIGHS SOFT TISSUE ABSCESS
WITH PLACEMENT OF INDWELLING CATHETER

MEDICATIONS:
No IV conscious sedation was administered.

PROCEDURE:
The procedure, risks, benefits, and alternatives were explained to
the patient. Questions regarding the procedure were encouraged and
answered. The patient understands and consents to the procedure.

The left thigh was prepped with Betadine in a sterile fashion, and a
sterile drape was applied covering the operative field. A sterile
gown and sterile gloves were used for the procedure. Local
anesthesia was provided with 1% Lidocaine.

Ultrasound was used to localize an anterior left thigh abscess.
Under ultrasound guidance, an 18 gauge trocar needle was advanced
into the collection. Aspiration of fluid was performed and a sample
sent for culture.

A guidewire was advanced through the needle. The needle was removed
and the percutaneous tract dilated over the wire. A 10 French
pigtail drainage catheter was advanced into the collection. The
catheter was formed and flushed with saline. It was then connected
to a suction bulb. The catheter was secured at the skin with a
Prolene retention suture and adhesive stat lock device.

COMPLICATIONS:
None.
FINDINGS: Ultrasound demonstrates an elongated and complex abscess of the
anterior left thigh. Aspiration yielded grossly purulent fluid.
After placement of the drainage catheter, there was immediate return
of a copious amount of purulent fluid.
IMPRESSION: Ultrasound-guided percutaneous drainage of left thigh soft tissue
abscess. A 10 French drain was placed and attached to suction bulb
drainage. A fluid sample was sent for culture analysis.

## 2015-10-08 ENCOUNTER — Emergency Department (HOSPITAL_COMMUNITY): Payer: Medicare Other

## 2015-10-08 ENCOUNTER — Encounter (HOSPITAL_COMMUNITY): Payer: Self-pay | Admitting: Neurology

## 2015-10-08 ENCOUNTER — Observation Stay (HOSPITAL_COMMUNITY)
Admission: EM | Admit: 2015-10-08 | Discharge: 2015-10-11 | Disposition: A | Discharge status: Home / Self Care | Payer: Medicare Other | Attending: Internal Medicine | Admitting: Internal Medicine

## 2015-10-08 DIAGNOSIS — M86659 Other chronic osteomyelitis, unspecified thigh: Secondary | ICD-10-CM | POA: Diagnosis present

## 2015-10-08 DIAGNOSIS — I10 Essential (primary) hypertension: Secondary | ICD-10-CM | POA: Insufficient documentation

## 2015-10-08 DIAGNOSIS — Z6827 Body mass index (BMI) 27.0-27.9, adult: Secondary | ICD-10-CM | POA: Diagnosis not present

## 2015-10-08 DIAGNOSIS — G4733 Obstructive sleep apnea (adult) (pediatric): Secondary | ICD-10-CM | POA: Insufficient documentation

## 2015-10-08 DIAGNOSIS — L89154 Pressure ulcer of sacral region, stage 4: Secondary | ICD-10-CM | POA: Diagnosis not present

## 2015-10-08 DIAGNOSIS — D509 Iron deficiency anemia, unspecified: Secondary | ICD-10-CM | POA: Insufficient documentation

## 2015-10-08 DIAGNOSIS — R1013 Epigastric pain: Secondary | ICD-10-CM | POA: Diagnosis not present

## 2015-10-08 DIAGNOSIS — G8250 Quadriplegia, unspecified: Secondary | ICD-10-CM | POA: Diagnosis not present

## 2015-10-08 DIAGNOSIS — K2971 Gastritis, unspecified, with bleeding: Secondary | ICD-10-CM | POA: Insufficient documentation

## 2015-10-08 DIAGNOSIS — E876 Hypokalemia: Secondary | ICD-10-CM | POA: Diagnosis not present

## 2015-10-08 DIAGNOSIS — R110 Nausea: Secondary | ICD-10-CM | POA: Diagnosis present

## 2015-10-08 DIAGNOSIS — K922 Gastrointestinal hemorrhage, unspecified: Secondary | ICD-10-CM | POA: Diagnosis present

## 2015-10-08 DIAGNOSIS — D62 Acute posthemorrhagic anemia: Secondary | ICD-10-CM | POA: Diagnosis not present

## 2015-10-08 DIAGNOSIS — E43 Unspecified severe protein-calorie malnutrition: Secondary | ICD-10-CM | POA: Diagnosis not present

## 2015-10-08 DIAGNOSIS — D638 Anemia in other chronic diseases classified elsewhere: Secondary | ICD-10-CM | POA: Diagnosis not present

## 2015-10-08 DIAGNOSIS — K3184 Gastroparesis: Secondary | ICD-10-CM | POA: Diagnosis present

## 2015-10-08 DIAGNOSIS — K920 Hematemesis: Secondary | ICD-10-CM | POA: Diagnosis not present

## 2015-10-08 DIAGNOSIS — K210 Gastro-esophageal reflux disease with esophagitis: Secondary | ICD-10-CM | POA: Insufficient documentation

## 2015-10-08 DIAGNOSIS — M8668 Other chronic osteomyelitis, other site: Secondary | ICD-10-CM | POA: Diagnosis not present

## 2015-10-08 DIAGNOSIS — K449 Diaphragmatic hernia without obstruction or gangrene: Secondary | ICD-10-CM | POA: Insufficient documentation

## 2015-10-08 DIAGNOSIS — Z7401 Bed confinement status: Secondary | ICD-10-CM | POA: Diagnosis not present

## 2015-10-08 LAB — CBC
HCT: 29.8 % — ABNORMAL LOW (ref 39.0–52.0)
Hemoglobin: 9 g/dL — ABNORMAL LOW (ref 13.0–17.0)
MCH: 23.7 pg — AB (ref 26.0–34.0)
MCHC: 30.2 g/dL (ref 30.0–36.0)
MCV: 78.4 fL (ref 78.0–100.0)
PLATELETS: 420 10*3/uL — AB (ref 150–400)
RBC: 3.8 MIL/uL — ABNORMAL LOW (ref 4.22–5.81)
RDW: 17.8 % — AB (ref 11.5–15.5)
WBC: 10.6 10*3/uL — AB (ref 4.0–10.5)

## 2015-10-08 LAB — COMPREHENSIVE METABOLIC PANEL
ALT: 14 U/L — AB (ref 17–63)
AST: 18 U/L (ref 15–41)
Albumin: 2.5 g/dL — ABNORMAL LOW (ref 3.5–5.0)
Alkaline Phosphatase: 82 U/L (ref 38–126)
Anion gap: 8 (ref 5–15)
BUN: 11 mg/dL (ref 6–20)
CHLORIDE: 106 mmol/L (ref 101–111)
CO2: 26 mmol/L (ref 22–32)
CREATININE: 0.33 mg/dL — AB (ref 0.61–1.24)
Calcium: 8.7 mg/dL — ABNORMAL LOW (ref 8.9–10.3)
GFR calc Af Amer: >60 mL/min (ref >60)
Glucose, Bld: 109 mg/dL — ABNORMAL HIGH (ref 65–99)
Potassium: 3.1 mmol/L — ABNORMAL LOW (ref 3.5–5.1)
Sodium: 140 mmol/L (ref 135–145)
Total Bilirubin: 0.3 mg/dL (ref 0.3–1.2)
Total Protein: 7.6 g/dL (ref 6.5–8.1)

## 2015-10-08 LAB — URINE MICROSCOPIC-ADD ON
RBC / HPF: NONE SEEN RBC/hpf (ref 0–5)
Squamous Epithelial / LPF: NONE SEEN

## 2015-10-08 LAB — OCCULT BLOOD GASTRIC / DUODENUM (SPECIMEN CUP): Occult Blood, Gastric: POSITIVE — AB

## 2015-10-08 LAB — URINALYSIS, ROUTINE W REFLEX MICROSCOPIC
BILIRUBIN URINE: NEGATIVE
GLUCOSE, UA: NEGATIVE mg/dL
HGB URINE DIPSTICK: NEGATIVE
KETONES UR: 15 mg/dL — AB
Nitrite: NEGATIVE
PROTEIN: 100 mg/dL — AB
Specific Gravity, Urine: 1.024 (ref 1.005–1.030)
pH: 6.5 (ref 5.0–8.0)

## 2015-10-08 LAB — PROTIME-INR
INR: 1.31 (ref 0.00–1.49)
PROTHROMBIN TIME: 16.5 s — AB (ref 11.6–15.2)

## 2015-10-08 LAB — I-STAT CG4 LACTIC ACID, ED
LACTIC ACID, VENOUS: 0.63 mmol/L (ref 0.5–2.0)
Lactic Acid, Venous: 0.57 mmol/L (ref 0.5–2.0)

## 2015-10-08 LAB — LIPASE, BLOOD: LIPASE: 25 U/L (ref 11–51)

## 2015-10-08 MED ORDER — SODIUM CHLORIDE 0.9% FLUSH
10.0000 mL | INTRAVENOUS | Status: DC | PRN
  Administered 2015-10-10 (×2): 10 mL
  Filled 2015-10-08 (×2): qty 40

## 2015-10-08 MED ORDER — ONDANSETRON HCL 4 MG/2ML IJ SOLN
4.0000 mg | Freq: Once | INTRAMUSCULAR | Status: AC
  Administered 2015-10-08: 4 mg via INTRAVENOUS
  Filled 2015-10-08: qty 2

## 2015-10-08 MED ORDER — SODIUM CHLORIDE 0.9 % IV BOLUS (SEPSIS)
1000.0000 mL | Freq: Once | INTRAVENOUS | Status: AC
  Administered 2015-10-08: 1000 mL via INTRAVENOUS

## 2015-10-08 MED ORDER — PANTOPRAZOLE SODIUM 40 MG IV SOLR: 40.0000 mg | Freq: Two times a day (BID) | INTRAVENOUS | Status: DC

## 2015-10-08 MED ORDER — PROCHLORPERAZINE EDISYLATE 5 MG/ML IJ SOLN
10.0000 mg | Freq: Once | INTRAMUSCULAR | Status: AC
  Administered 2015-10-08: 10 mg via INTRAVENOUS
  Filled 2015-10-08: qty 2

## 2015-10-08 MED ORDER — SODIUM CHLORIDE 0.9 % IV SOLN
8.0000 mg/h | INTRAVENOUS | Status: DC
  Administered 2015-10-08 – 2015-10-10 (×4): 8 mg/h via INTRAVENOUS
  Filled 2015-10-08 (×13): qty 80

## 2015-10-08 MED ORDER — PROCHLORPERAZINE EDISYLATE 5 MG/ML IJ SOLN: 10.0000 mg | Freq: Four times a day (QID) | INTRAMUSCULAR | Status: DC | PRN

## 2015-10-08 MED ORDER — METOCLOPRAMIDE HCL 5 MG/ML IJ SOLN
10.0000 mg | Freq: Four times a day (QID) | INTRAMUSCULAR | Status: DC
  Administered 2015-10-08 – 2015-10-11 (×12): 10 mg via INTRAVENOUS
  Filled 2015-10-08 (×12): qty 2

## 2015-10-08 MED ORDER — BACLOFEN 20 MG PO TABS
20.0000 mg | ORAL_TABLET | Freq: Four times a day (QID) | ORAL | Status: DC
  Administered 2015-10-08 – 2015-10-11 (×11): 20 mg via ORAL
  Filled 2015-10-08 (×12): qty 1

## 2015-10-08 MED ORDER — DICLOFENAC SODIUM 1 % TD GEL
2.0000 g | Freq: Four times a day (QID) | TRANSDERMAL | Status: DC
  Filled 2015-10-08: qty 100

## 2015-10-08 MED ORDER — SODIUM CHLORIDE 0.9% FLUSH
10.0000 mL | Freq: Two times a day (BID) | INTRAVENOUS | Status: DC
  Administered 2015-10-09 – 2015-10-10 (×2): 10 mL

## 2015-10-08 MED ORDER — POTASSIUM CHLORIDE IN NACL 40-0.9 MEQ/L-% IV SOLN
INTRAVENOUS | Status: DC
Start: 2015-10-08 — End: 2015-10-09
  Administered 2015-10-08 – 2015-10-09 (×2): 125 mL/h via INTRAVENOUS
  Filled 2015-10-08 (×5): qty 1000

## 2015-10-08 MED ORDER — POTASSIUM CHLORIDE CRYS ER 20 MEQ PO TBCR
40.0000 meq | EXTENDED_RELEASE_TABLET | Freq: Once | ORAL | Status: DC
  Filled 2015-10-08: qty 2

## 2015-10-08 MED ORDER — ACETAMINOPHEN 325 MG PO TABS: 650.0000 mg | ORAL_TABLET | Freq: Four times a day (QID) | ORAL | Status: DC | PRN

## 2015-10-08 MED ORDER — ACETAMINOPHEN 650 MG RE SUPP: 650.0000 mg | Freq: Four times a day (QID) | RECTAL | Status: DC | PRN

## 2015-10-08 MED ORDER — SODIUM CHLORIDE 0.9 % IV SOLN
80.0000 mg | Freq: Once | INTRAVENOUS | Status: AC
  Administered 2015-10-08: 80 mg via INTRAVENOUS
  Filled 2015-10-08: qty 80

## 2015-10-08 MED ORDER — IOPAMIDOL (ISOVUE-300) INJECTION 61%
INTRAVENOUS | Status: AC
  Administered 2015-10-08: 100 mL
  Filled 2015-10-08: qty 100

## 2015-10-08 MED ORDER — LIDOCAINE 5 % EX PTCH
1.0000 | MEDICATED_PATCH | CUTANEOUS | Status: DC
  Filled 2015-10-08 (×3): qty 1

## 2015-10-08 MED ORDER — POTASSIUM CHLORIDE 10 MEQ/100ML IV SOLN
10.0000 meq | Freq: Once | INTRAVENOUS | Status: AC
  Administered 2015-10-08: 10 meq via INTRAVENOUS
  Filled 2015-10-08: qty 100

## 2015-10-08 MED ORDER — SUCRALFATE 1 G PO TABS
1.0000 g | ORAL_TABLET | Freq: Four times a day (QID) | ORAL | Status: DC
  Administered 2015-10-08 – 2015-10-11 (×11): 1 g via ORAL
  Filled 2015-10-08 (×11): qty 1

## 2015-10-08 MED ORDER — MORPHINE SULFATE (PF) 4 MG/ML IV SOLN: 4.0000 mg | INTRAVENOUS | Status: DC | PRN

## 2015-10-08 NOTE — ED Notes (Addendum)
Pt called out st "I am about to be sick." This NT went to bedside and help emesis bag for him. Pt proceeded to vomit 300cc of blood. Threasa Beards, RN notified and to bedside. Dr. Canary Brim aware as well.

## 2015-10-08 NOTE — ED Notes (Signed)
Pt reports abd pain since 0300 this morning, epigastric. Has been vomiting, reports his ileus is acting up. Pt has colostomy and suprapubic catheter. BP 96/70. Has PICC line
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
## 2015-10-08 NOTE — ED Notes (Signed)
Attempted to call report

## 2015-10-08 NOTE — ED Notes (Signed)
Pt has vomited up 300 cc of blood, Dr. Canary Brim aware.

## 2015-10-08 NOTE — ED Notes (Signed)
Iv team at bedside  

## 2015-10-08 NOTE — ED Notes (Signed)
Pt reports he is still nauseous. Dr. Darnelle Bos informed and gave order for zofran. This RN also just called CT to inform them that the patient is ready for CT scan. They state they will come get him for CT as soon as they can.
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
## 2015-10-09 ENCOUNTER — Observation Stay (HOSPITAL_COMMUNITY): Payer: Medicare Other | Admitting: Anesthesiology

## 2015-10-09 ENCOUNTER — Encounter (HOSPITAL_COMMUNITY)
Admission: EM | Disposition: A | Discharge status: Home / Self Care | Payer: Self-pay | Source: Home / Self Care | Attending: Internal Medicine

## 2015-10-09 ENCOUNTER — Encounter (HOSPITAL_COMMUNITY): Payer: Self-pay | Admitting: Anesthesiology

## 2015-10-09 DIAGNOSIS — K209 Esophagitis, unspecified: Secondary | ICD-10-CM | POA: Diagnosis not present

## 2015-10-09 DIAGNOSIS — K920 Hematemesis: Secondary | ICD-10-CM

## 2015-10-09 DIAGNOSIS — L89154 Pressure ulcer of sacral region, stage 4: Secondary | ICD-10-CM

## 2015-10-09 DIAGNOSIS — K3184 Gastroparesis: Principal | ICD-10-CM

## 2015-10-09 DIAGNOSIS — D638 Anemia in other chronic diseases classified elsewhere: Secondary | ICD-10-CM

## 2015-10-09 DIAGNOSIS — K922 Gastrointestinal hemorrhage, unspecified: Secondary | ICD-10-CM

## 2015-10-09 DIAGNOSIS — M86651 Other chronic osteomyelitis, right thigh: Secondary | ICD-10-CM | POA: Diagnosis not present

## 2015-10-09 DIAGNOSIS — G8250 Quadriplegia, unspecified: Secondary | ICD-10-CM

## 2015-10-09 DIAGNOSIS — K208 Other esophagitis: Secondary | ICD-10-CM | POA: Diagnosis not present

## 2015-10-09 DIAGNOSIS — K449 Diaphragmatic hernia without obstruction or gangrene: Secondary | ICD-10-CM | POA: Diagnosis not present

## 2015-10-09 DIAGNOSIS — K210 Gastro-esophageal reflux disease with esophagitis: Secondary | ICD-10-CM | POA: Diagnosis not present

## 2015-10-09 DIAGNOSIS — I10 Essential (primary) hypertension: Secondary | ICD-10-CM | POA: Diagnosis not present

## 2015-10-09 DIAGNOSIS — R110 Nausea: Secondary | ICD-10-CM

## 2015-10-09 DIAGNOSIS — D62 Acute posthemorrhagic anemia: Secondary | ICD-10-CM | POA: Diagnosis not present

## 2015-10-09 HISTORY — PX: ESOPHAGOGASTRODUODENOSCOPY (EGD) WITH PROPOFOL: SHX5813

## 2015-10-09 LAB — CBC
HEMATOCRIT: 26.6 % — AB (ref 39.0–52.0)
HEMATOCRIT: 30.1 % — AB (ref 39.0–52.0)
HEMOGLOBIN: 7.8 g/dL — AB (ref 13.0–17.0)
HEMOGLOBIN: 8.9 g/dL — AB (ref 13.0–17.0)
MCH: 23.4 pg — AB (ref 26.0–34.0)
MCH: 23.8 pg — AB (ref 26.0–34.0)
MCHC: 29.3 g/dL — AB (ref 30.0–36.0)
MCHC: 29.6 g/dL — AB (ref 30.0–36.0)
MCV: 79.9 fL (ref 78.0–100.0)
MCV: 80.5 fL (ref 78.0–100.0)
Platelets: 377 10*3/uL (ref 150–400)
Platelets: 365 10*3/uL (ref 150–400)
RBC: 3.33 MIL/uL — ABNORMAL LOW (ref 4.22–5.81)
RBC: 3.74 MIL/uL — ABNORMAL LOW (ref 4.22–5.81)
RDW: 17.9 % — ABNORMAL HIGH (ref 11.5–15.5)
RDW: 17.2 % — ABNORMAL HIGH (ref 11.5–15.5)
WBC: 10.5 10*3/uL (ref 4.0–10.5)
WBC: 9.2 10*3/uL (ref 4.0–10.5)

## 2015-10-09 LAB — URINE CULTURE: Culture: NO GROWTH

## 2015-10-09 LAB — COMPREHENSIVE METABOLIC PANEL
ALBUMIN: 2.2 g/dL — AB (ref 3.5–5.0)
ALT: 11 U/L — ABNORMAL LOW (ref 17–63)
ANION GAP: 6 (ref 5–15)
AST: 14 U/L — ABNORMAL LOW (ref 15–41)
Alkaline Phosphatase: 67 U/L (ref 38–126)
BILIRUBIN TOTAL: 0.3 mg/dL (ref 0.3–1.2)
BUN: 9 mg/dL (ref 6–20)
CALCIUM: 8.4 mg/dL — AB (ref 8.9–10.3)
CO2: 28 mmol/L (ref 22–32)
Chloride: 108 mmol/L (ref 101–111)
Creatinine, Ser: 0.36 mg/dL — ABNORMAL LOW (ref 0.61–1.24)
GFR calc Af Amer: >60 mL/min (ref >60)
GFR calc non Af Amer: >60 mL/min (ref >60)
GLUCOSE: 101 mg/dL — AB (ref 65–99)
Potassium: 3.6 mmol/L (ref 3.5–5.1)
Sodium: 142 mmol/L (ref 135–145)
Total Protein: 6.8 g/dL (ref 6.5–8.1)

## 2015-10-09 LAB — PREPARE RBC (CROSSMATCH)

## 2015-10-09 SURGERY — ESOPHAGOGASTRODUODENOSCOPY (EGD) WITH PROPOFOL
Anesthesia: Monitor Anesthesia Care

## 2015-10-09 MED ORDER — ONDANSETRON HCL 4 MG/2ML IJ SOLN
INTRAMUSCULAR | Status: DC | PRN
  Administered 2015-10-09: 4 mg via INTRAVENOUS

## 2015-10-09 MED ORDER — SODIUM CHLORIDE 0.9 % IV SOLN
INTRAVENOUS | Status: DC
  Administered 2015-10-09: 1000 mL via INTRAVENOUS
  Administered 2015-10-09 – 2015-10-11 (×4): via INTRAVENOUS

## 2015-10-09 MED ORDER — LACTATED RINGERS IV SOLN
INTRAVENOUS | Status: DC | PRN
  Administered 2015-10-09: 10:00:00 via INTRAVENOUS

## 2015-10-09 MED ORDER — SODIUM CHLORIDE 0.9 % IV SOLN: Freq: Once | INTRAVENOUS | Status: DC

## 2015-10-09 MED ORDER — LACTATED RINGERS IV SOLN
INTRAVENOUS | Status: DC
  Administered 2015-10-09: 1000 mL via INTRAVENOUS

## 2015-10-09 MED ORDER — PROPOFOL 500 MG/50ML IV EMUL
INTRAVENOUS | Status: DC | PRN
  Administered 2015-10-09: 75 ug/kg/min via INTRAVENOUS

## 2015-10-09 MED ORDER — PROPOFOL 10 MG/ML IV BOLUS
INTRAVENOUS | Status: DC | PRN
  Administered 2015-10-09 (×2): 30 mg via INTRAVENOUS

## 2015-10-09 MED ORDER — SODIUM CHLORIDE 0.9 % IV SOLN: INTRAVENOUS | Status: DC

## 2015-10-09 NOTE — Interval H&P Note (Signed)
History and Physical Interval Note:  10/09/2015 10:08 AM  [name] [name]  has presented today for surgery, with the diagnosis of GI bleed   The various methods of treatment have been discussed with the patient and family. After consideration of risks, benefits and other options for treatment, the patient has consented to  Procedure(s): ESOPHAGOGASTRODUODENOSCOPY (EGD) WITH PROPOFOL (N/A) as a surgical intervention .  The patient's history has been reviewed, patient examined, no change in status, stable for surgery.  I have reviewed the patient's chart and labs.  Questions were answered to the patient's satisfaction.     [place] C.

## 2015-10-09 NOTE — Consult Note (Signed)
WOC consulted for ostomy.  Patient well known to the [facility] team.  I have contacted the wound care center because he is followed for maintenance of all of his chronic wounds there, wound present for at least 2 + years.   Discussed wounds: sacrum/LE/ and bilateral ischial wounds with wound care center staff.  Patient was last seen a week ago and his POC updated at that time.  Orders written for wound care.  Contacted bedside nurse for clarification on consult for ostomy, she reports ostomy is fine with no problems. She can order him ostomy supplies if the patient needs them while inpatient.  Patient is completely dependent in ostomy care.   Low air loss mattress orders in the computer from ED for pressure redistribution.    Re consult if needed, will not follow at this time. Thanks  Leahanna Buser [place], [place] [phone])

## 2015-10-09 NOTE — Progress Notes (Signed)
[facility]  Patient Status: Active (receiving services up to time of hospitalization)  [facility] is providing the following services: RN  If patient discharges after hours, please call [phone].   Janae Sauce 10/09/2015, 1:31 PM

## 2015-10-09 NOTE — Op Note (Signed)
[facility] Patient Name: [name] [name] Procedure Date : 10/09/2015 MRN: [id number] Attending MD: Lear Ng , MD Date of Birth: 1965-06-03 CSN: [id number] Age: 50 Admit Type: Inpatient Procedure:                Upper GI endoscopy Indications:              Acute post hemorrhagic anemia, Hematemesis Providers:                Lear Ng, MD, Cleda Daub, RN, Ralene Bathe, Technician, Jacquiline Doe, CRNA Referring MD:              Medicines:                Propofol per Anesthesia, Monitored Anesthesia Care Complications:            No immediate complications. Estimated Blood Loss:     Estimated blood loss was minimal. Procedure:                Pre-Anesthesia Assessment:                           - Prior to the procedure, a History and Physical                            was performed, and patient medications and                            allergies were reviewed. The patient's tolerance of                            previous anesthesia was also reviewed. The risks                            and benefits of the procedure and the sedation                            options and risks were discussed with the patient.                            All questions were answered, and informed consent                            was obtained. Prior Anticoagulants: The patient has                            taken no previous anticoagulant or antiplatelet                            agents. ASA Grade Assessment: III - A patient with                            severe systemic disease. After reviewing the risks  and benefits, the patient was deemed in                            satisfactory condition to undergo the procedure.                           After obtaining informed consent, the endoscope was                            passed under direct vision. Throughout the                            procedure, the patient's  blood pressure, pulse, and                            oxygen saturations were monitored continuously. The                            [id number]) scope was introduced through the                            mouth, and advanced to the second part of duodenum.                            The upper GI endoscopy was accomplished without                            difficulty. The patient tolerated the procedure                            well. NG tube removed prior to insertion of the                            endoscope. Scope In: Scope Out: Findings:      The oropharynx was normal.      LA Grade D (one or more mucosal breaks involving at least 75% of       esophageal circumference) esophagitis with bleeding was found 36 to 45       cm from the incisors.      The Z-line was found 45 cm from the incisors.      A medium-sized hiatal hernia was present.      Patchy mild mucosal changes characterized by erythema were found on the       lesser curvature of the stomach (likely due to NG suction trauma).       Stomach was severely dilated and a small amount of clear bilious fluid       seen that was suctioned.      The examined duodenum was normal. Due to dilated stomach only able to       see proximal portion of the 2nd portion of the duodenum. Impression:               - Normal oropharynx.                           - LA Grade D  reflux, acute and erosive esophagitis.                           - Z-line, 45 cm from the incisors.                           - Medium-sized hiatal hernia.                           - Erythematous mucosa in the lesser curvature.                           - Normal examined duodenum.                           - No specimens collected. Moderate Sedation:      N/A - MAC procedure Recommendation:           - Give Protonix (pantoprazole): 8 mg/hr IV by                            continuous infusion.                           - Clear liquid diet.                            - NG tube discontinued.                           - Post procedure medication orders were given. Procedure Code(s):        --- Professional ---                           [phone], Esophagogastroduodenoscopy, flexible,                            transoral; diagnostic, including collection of                            specimen(s) by brushing or washing, when performed                            (separate procedure) Diagnosis Code(s):        --- Professional ---                           K92.0, Hematemesis                           K21.0, Gastro-esophageal reflux disease with                            esophagitis                           K20.8, Other esophagitis  K44.9, Diaphragmatic hernia without obstruction or                            gangrene                           D62, Acute posthemorrhagic anemia                           K31.89, Other diseases of stomach and duodenum CPT copyright 2016 [employer]. All rights reserved. The codes documented in this report are preliminary and upon coder review may  be revised to meet current compliance requirements. Lear Ng, MD 10/09/2015 11:01:58 AM This report has been signed electronically. Number of Addenda: 0

## 2015-10-09 NOTE — Transfer of Care (Signed)
Immediate Anesthesia Transfer of Care Note  Patient: [name] [name]  Procedure(s) Performed: Procedure(s): ESOPHAGOGASTRODUODENOSCOPY (EGD) WITH PROPOFOL (N/A)  Patient Location: PACU  Anesthesia Type:MAC  Level of Consciousness: awake, oriented, sedated, patient cooperative and responds to stimulation  Airway & Oxygen Therapy: Patient Spontanous Breathing and Patient connected to nasal cannula oxygen  Post-op Assessment: Report given to RN and Post -op Vital signs reviewed and stable  Post vital signs: Reviewed and stable  Last Vitals:  Filed Vitals:   10/09/15 0945 10/09/15 1104  BP: 127/76 115/45  Pulse: 77 88  Temp:    Resp: 12 15    Last Pain:  Filed Vitals:   10/09/15 1104  PainSc: Asleep         Complications: No apparent anesthesia complications
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
## 2015-10-09 NOTE — Brief Op Note (Addendum)
Moderate to severe ulcerative esophagitis. Clear bilious fluid in stomach. See procedure report for details. NG removed prior to EGD and would not recommend reinsertion since only minimal amount of fluid remaining in stomach. Continue Protonix drip until tomorrow and then change to IV PPI Q 12 hours until tolerating solid food. Clear liquid diet today and if stable ok to advance diet tomorrow. Discussed findings and recs in detail with patient and [name]. Dr. Cristina Gong available to see this weekend if needed. Will sign off otherwise and call us back if needed.
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
## 2015-10-09 NOTE — Anesthesia Postprocedure Evaluation (Signed)
Anesthesia Post Note  Patient: [name] [name]  Procedure(s) Performed: Procedure(s) (LRB): ESOPHAGOGASTRODUODENOSCOPY (EGD) WITH PROPOFOL (N/A)  Patient location during evaluation: PACU Anesthesia Type: MAC Level of consciousness: awake and alert Pain management: pain level controlled Vital Signs Assessment: post-procedure vital signs reviewed and stable Respiratory status: spontaneous breathing, nonlabored ventilation, respiratory function stable and patient connected to nasal cannula oxygen Cardiovascular status: blood pressure returned to baseline and stable Postop Assessment: no signs of nausea or vomiting Anesthetic complications: no    Last Vitals:  Filed Vitals:   10/09/15 1104 10/09/15 1110  BP: 115/45 99/60  Pulse: 88 87  Temp:    Resp: 15 19    Last Pain:  Filed Vitals:   10/09/15 1117  PainSc: Asleep                 Norma Montemurro S

## 2015-10-10 DIAGNOSIS — M86651 Other chronic osteomyelitis, right thigh: Secondary | ICD-10-CM

## 2015-10-10 DIAGNOSIS — E43 Unspecified severe protein-calorie malnutrition: Secondary | ICD-10-CM

## 2015-10-10 DIAGNOSIS — D638 Anemia in other chronic diseases classified elsewhere: Secondary | ICD-10-CM | POA: Diagnosis not present

## 2015-10-10 DIAGNOSIS — K920 Hematemesis: Secondary | ICD-10-CM | POA: Diagnosis not present

## 2015-10-10 DIAGNOSIS — K922 Gastrointestinal hemorrhage, unspecified: Secondary | ICD-10-CM | POA: Diagnosis not present

## 2015-10-10 LAB — TYPE AND SCREEN
ABO/RH(D): B POS
Antibody Screen: NEGATIVE
Unit division: 0

## 2015-10-10 LAB — BASIC METABOLIC PANEL
ANION GAP: 6 (ref 5–15)
BUN: <5 mg/dL — ABNORMAL LOW (ref 6–20)
CALCIUM: 8.6 mg/dL — AB (ref 8.9–10.3)
CHLORIDE: 106 mmol/L (ref 101–111)
CO2: 26 mmol/L (ref 22–32)
Creatinine, Ser: 0.35 mg/dL — ABNORMAL LOW (ref 0.61–1.24)
GFR calc non Af Amer: >60 mL/min (ref >60)
GLUCOSE: 89 mg/dL (ref 65–99)
Potassium: 3.3 mmol/L — ABNORMAL LOW (ref 3.5–5.1)
Sodium: 138 mmol/L (ref 135–145)

## 2015-10-10 LAB — CBC
HEMATOCRIT: 30.5 % — AB (ref 39.0–52.0)
HEMOGLOBIN: 9 g/dL — AB (ref 13.0–17.0)
MCH: 24.1 pg — ABNORMAL LOW (ref 26.0–34.0)
MCHC: 29.5 g/dL — AB (ref 30.0–36.0)
MCV: 81.8 fL (ref 78.0–100.0)
Platelets: 384 10*3/uL (ref 150–400)
RBC: 3.73 MIL/uL — ABNORMAL LOW (ref 4.22–5.81)
RDW: 17.3 % — AB (ref 11.5–15.5)
WBC: 7.7 10*3/uL (ref 4.0–10.5)
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
## 2015-10-11 ENCOUNTER — Encounter (HOSPITAL_COMMUNITY): Payer: Self-pay | Admitting: Gastroenterology

## 2015-10-11 DIAGNOSIS — K922 Gastrointestinal hemorrhage, unspecified: Secondary | ICD-10-CM | POA: Diagnosis not present

## 2015-10-11 LAB — BASIC METABOLIC PANEL
ANION GAP: 6 (ref 5–15)
BUN: <5 mg/dL — ABNORMAL LOW (ref 6–20)
CHLORIDE: 105 mmol/L (ref 101–111)
CO2: 27 mmol/L (ref 22–32)
Calcium: 8.5 mg/dL — ABNORMAL LOW (ref 8.9–10.3)
Creatinine, Ser: 0.36 mg/dL — ABNORMAL LOW (ref 0.61–1.24)
GFR calc Af Amer: >60 mL/min (ref >60)
GFR calc non Af Amer: >60 mL/min (ref >60)
GLUCOSE: 111 mg/dL — AB (ref 65–99)
POTASSIUM: 2.9 mmol/L — AB (ref 3.5–5.1)
Sodium: 138 mmol/L (ref 135–145)

## 2015-10-11 LAB — POTASSIUM: Potassium: 3.2 mmol/L — ABNORMAL LOW (ref 3.5–5.1)

## 2015-10-11 MED ORDER — POTASSIUM CHLORIDE 10 MEQ/100ML IV SOLN
10.0000 meq | INTRAVENOUS | Status: AC
  Administered 2015-10-11 (×3): 10 meq via INTRAVENOUS
  Filled 2015-10-11 (×3): qty 100

## 2015-10-11 MED ORDER — PANTOPRAZOLE SODIUM 40 MG PO TBEC: 40.0000 mg | DELAYED_RELEASE_TABLET | Freq: Two times a day (BID) | ORAL | Status: DC

## 2015-10-11 MED ORDER — SUCRALFATE 1 G PO TABS: 1.0000 g | ORAL_TABLET | Freq: Four times a day (QID) | ORAL | Status: DC

## 2015-10-11 MED ORDER — POTASSIUM CHLORIDE CRYS ER 20 MEQ PO TBCR
40.0000 meq | EXTENDED_RELEASE_TABLET | Freq: Once | ORAL | Status: AC
  Administered 2015-10-11: 40 meq via ORAL
  Filled 2015-10-11: qty 2

## 2015-10-11 MED ORDER — ONDANSETRON 4 MG PO TBDP
4.0000 mg | ORAL_TABLET | Freq: Three times a day (TID) | ORAL | Status: DC | PRN
Start: 2015-10-11 — End: 2016-05-07

## 2015-10-11 MED ORDER — POTASSIUM CHLORIDE CRYS ER 20 MEQ PO TBCR
40.0000 meq | EXTENDED_RELEASE_TABLET | ORAL | Status: AC
  Administered 2015-10-11 (×2): 40 meq via ORAL
  Filled 2015-10-11 (×2): qty 2

## 2015-10-11 MED ORDER — METOCLOPRAMIDE HCL 10 MG PO TABS: 10.0000 mg | ORAL_TABLET | Freq: Three times a day (TID) | ORAL | Status: DC

## 2015-10-11 MED ORDER — PANTOPRAZOLE SODIUM 40 MG PO TBEC
40.0000 mg | DELAYED_RELEASE_TABLET | Freq: Two times a day (BID) | ORAL | Status: DC
  Administered 2015-10-11 (×2): 40 mg via ORAL
  Filled 2015-10-11 (×2): qty 1

## 2015-10-11 NOTE — Progress Notes (Signed)
[name] to be D/C'd Home per MD order.  Discussed with the patient and all questions fully answered.  VSS, Skin clean, dry and intact without evidence of skin break down, no evidence of skin tears noted. IV catheter discontinued intact. Site without signs and symptoms of complications. Dressing and pressure applied.  An After Visit Summary was printed and given to the patient. Patient received prescriptions.  D/c education completed with patient/family including follow up instructions, medication list, d/c activities limitations if indicated, with other d/c instructions as indicated by MD - patient able to verbalize understanding, all questions fully answered.   Patient instructed to return to ED, call 911, or call MD for any changes in condition.   Patient escorted via stretcher, and D/C home via ambulance.  Vito Backers J Max Nuno 10/11/2015 8:00 PM
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
## 2015-10-12 IMAGING — CT CT PELVIS W/ CM
2 of 3 series · 17 of 46 positions shown, 19 images · IV contrast (OMNIPAQUE)
Comparison: 02/21/2013

CLINICAL DATA: Status post left thigh abscess drainage. Increasing
white count.

EXAM:
CT PELVIS WITH CONTRAST
TECHNIQUE: Multidetector CT imaging of the pelvis was performed using the
standard protocol following the bolus administration of intravenous
contrast.
CONTRAST:  100mL OMNIPAQUE IOHEXOL 300 MG/ML  SOLN

[Series 2: pelvis st · axial · 0.94mm/px · z∈[+1024,+1328]mm · 14 of 71 slices shown, 16 images]
[im 5/71  soft-tissue]
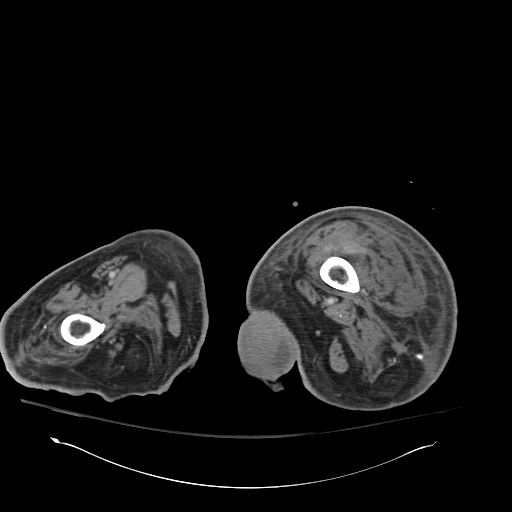
[im 5/71  bone]
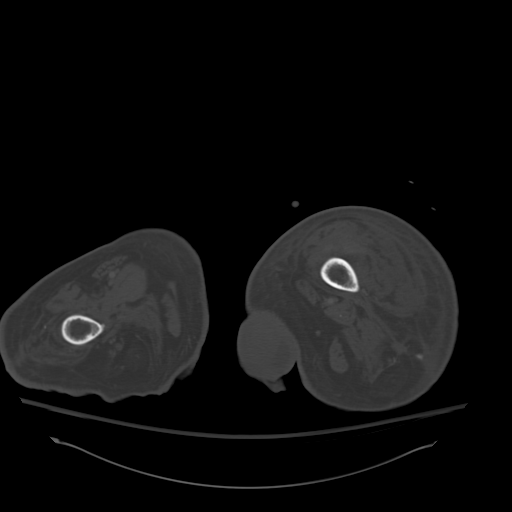
[im 10/71  soft-tissue]
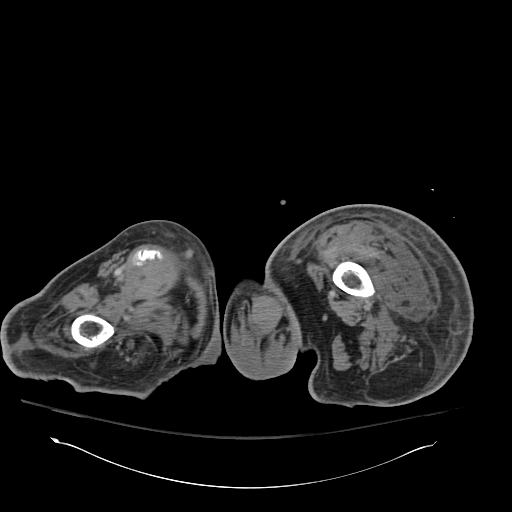
[im 14/71  soft-tissue]
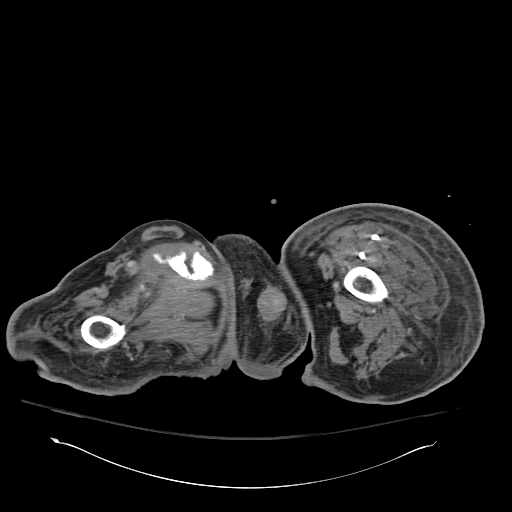
[im 19/71  soft-tissue]
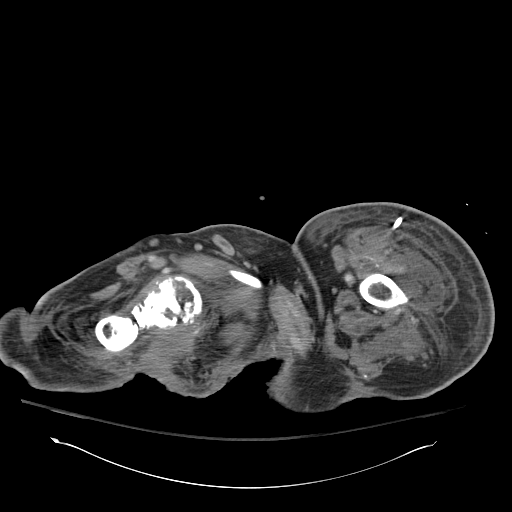
[im 23/71  soft-tissue]
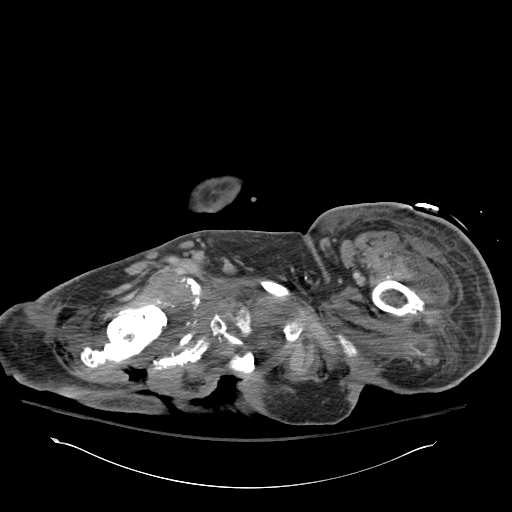
[im 28/71  soft-tissue]
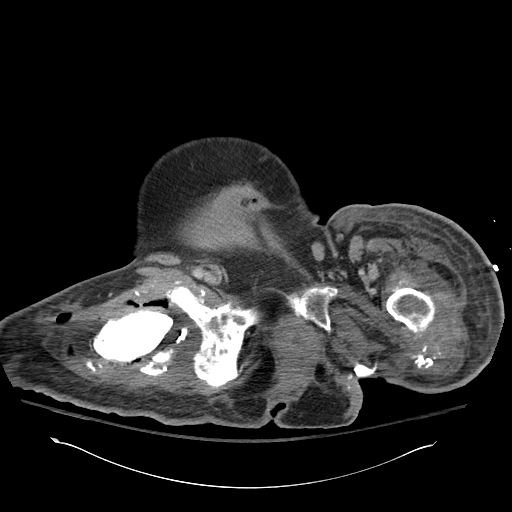
[im 32/71  soft-tissue]
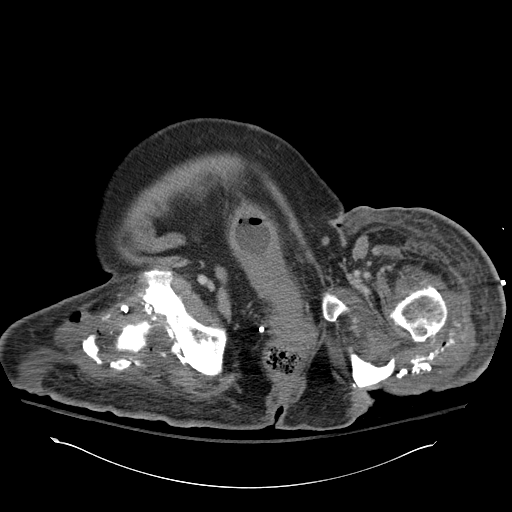
[im 39/71  soft-tissue]
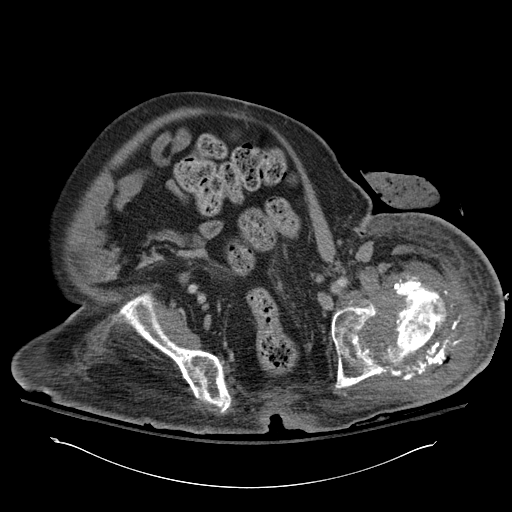
[im 43/71  soft-tissue]
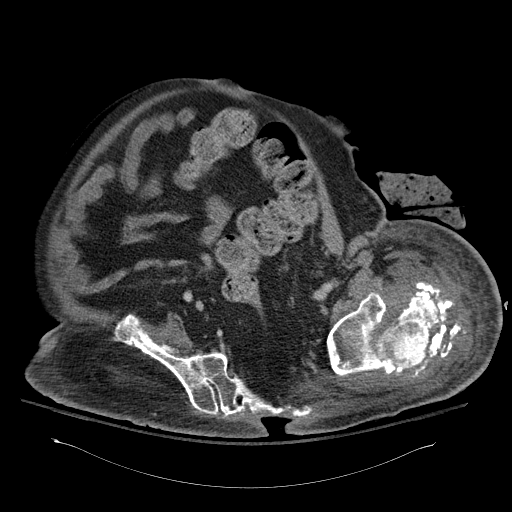
[im 43/71  bone]
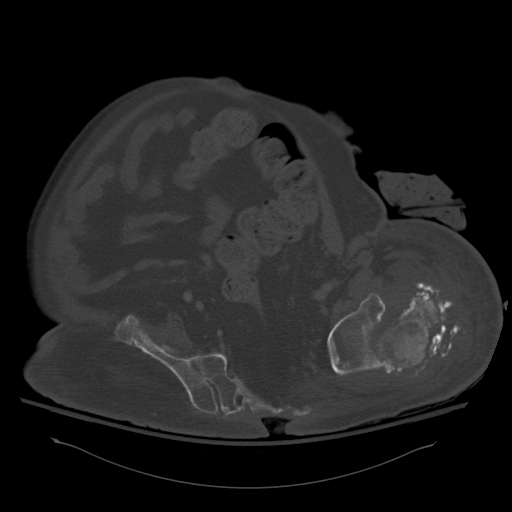
[im 48/71  soft-tissue]
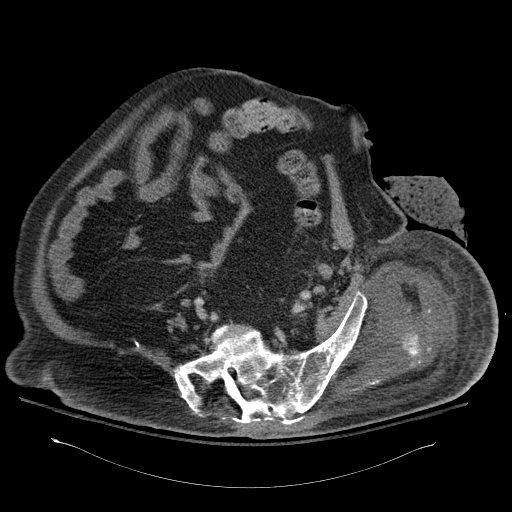
[im 52/71  soft-tissue]
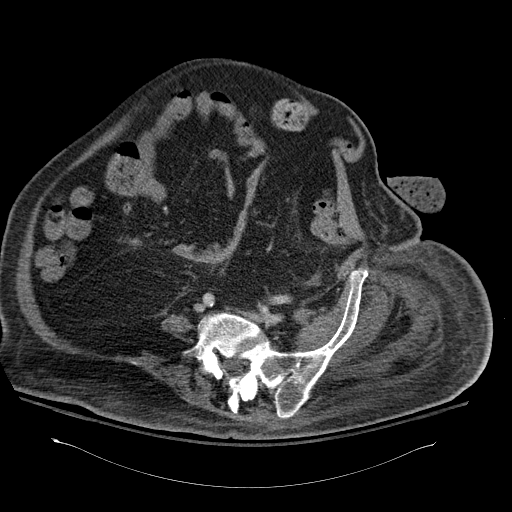
[im 57/71  soft-tissue]
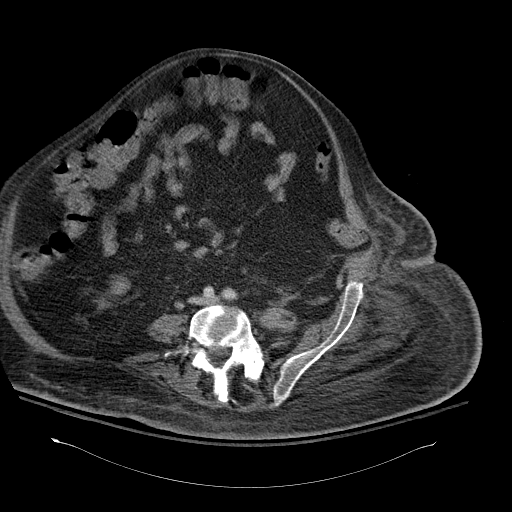
[im 61/71  soft-tissue]
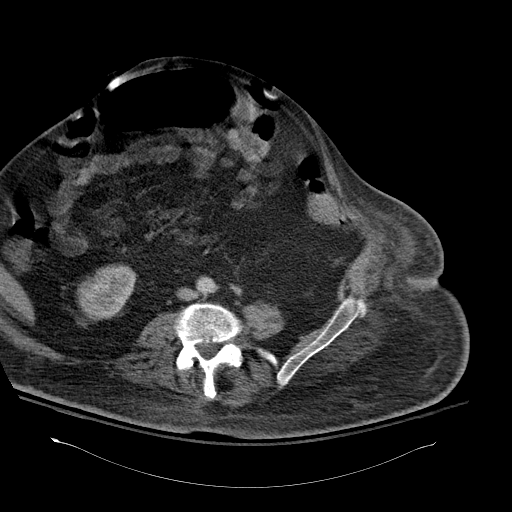
[im 66/71  soft-tissue]
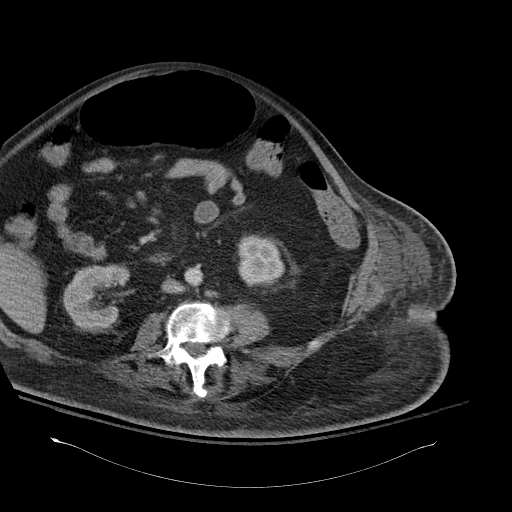

[Series 602: cor · coronal · 0.94mm/px · 3 of 183 slices shown]
[im 61/183  soft-tissue]
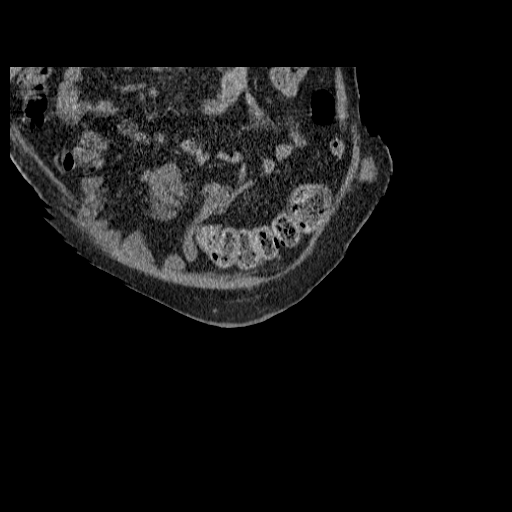
[im 81/183  soft-tissue]
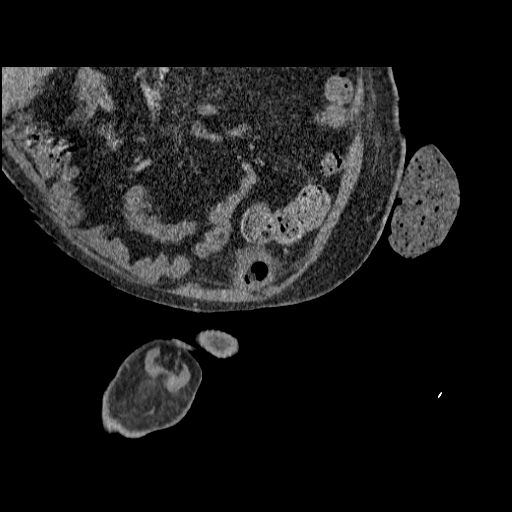
[im 102/183  soft-tissue]
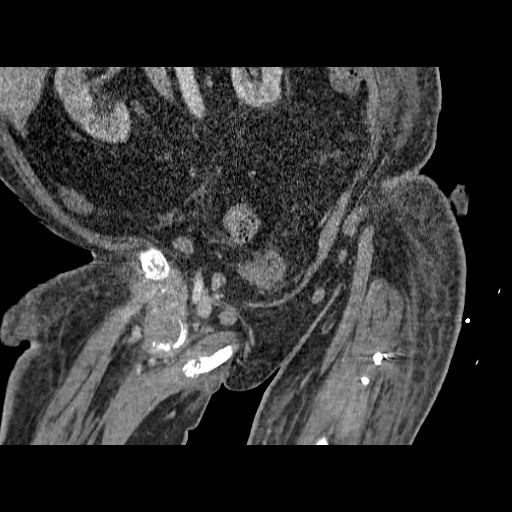

[17 of 46 positions shown; findings below may reference images not displayed]

FINDINGS: Interval placement of a percutaneous drain into the left thigh
abscess, located in the region of the upper rectus femoris and
vastus. No residual fluid collection identified. Unchanged
subcutaneous reticulation and skin thickening involving the left
thigh.

The remainder of the scan is stable. There is chronic fragmentation
and osteolysis around the bilateral hip joints, which communicate
with large posterior decubitus ulcers. The volume of gas and soft
tissue stranding in around the hip joints is stable

No new intra-abdominal findings to explain the increasing white
count. There is a suprapubic catheter and descending diverting
colostomy.
IMPRESSION: 1. No residual left thigh abscess. A percutaneous drain remains
central to the decompressed collection.
2. Similar extent of left thigh cellulitis.
3. Ischial and sacral decubitus ulcers with chronic septic arthritis
and osteomyelitis of the hips.

## 2015-10-15 ENCOUNTER — Encounter (HOSPITAL_BASED_OUTPATIENT_CLINIC_OR_DEPARTMENT_OTHER): Payer: Medicare Other | Attending: Internal Medicine

## 2015-10-15 DIAGNOSIS — L89324 Pressure ulcer of left buttock, stage 4: Secondary | ICD-10-CM | POA: Diagnosis not present

## 2015-10-15 DIAGNOSIS — L97811 Non-pressure chronic ulcer of other part of right lower leg limited to breakdown of skin: Secondary | ICD-10-CM | POA: Insufficient documentation

## 2015-10-15 DIAGNOSIS — L89154 Pressure ulcer of sacral region, stage 4: Secondary | ICD-10-CM | POA: Insufficient documentation

## 2015-10-15 DIAGNOSIS — L89894 Pressure ulcer of other site, stage 4: Secondary | ICD-10-CM | POA: Insufficient documentation

## 2015-10-15 DIAGNOSIS — G4730 Sleep apnea, unspecified: Secondary | ICD-10-CM | POA: Diagnosis not present

## 2015-10-15 DIAGNOSIS — I10 Essential (primary) hypertension: Secondary | ICD-10-CM | POA: Diagnosis not present

## 2015-10-15 DIAGNOSIS — L89210 Pressure ulcer of right hip, unstageable: Secondary | ICD-10-CM | POA: Insufficient documentation

## 2015-10-15 DIAGNOSIS — G8250 Quadriplegia, unspecified: Secondary | ICD-10-CM | POA: Diagnosis not present

## 2015-10-15 DIAGNOSIS — I87331 Chronic venous hypertension (idiopathic) with ulcer and inflammation of right lower extremity: Secondary | ICD-10-CM | POA: Diagnosis not present

## 2015-10-15 DIAGNOSIS — L89224 Pressure ulcer of left hip, stage 4: Secondary | ICD-10-CM | POA: Diagnosis not present

## 2015-10-15 DIAGNOSIS — D649 Anemia, unspecified: Secondary | ICD-10-CM | POA: Diagnosis not present

## 2015-10-15 DIAGNOSIS — G40909 Epilepsy, unspecified, not intractable, without status epilepticus: Secondary | ICD-10-CM | POA: Diagnosis not present

## 2015-10-15 DIAGNOSIS — M8668 Other chronic osteomyelitis, other site: Secondary | ICD-10-CM | POA: Diagnosis not present

## 2015-10-24 DIAGNOSIS — L89154 Pressure ulcer of sacral region, stage 4: Secondary | ICD-10-CM | POA: Diagnosis not present

## 2015-10-27 DIAGNOSIS — I878 Other specified disorders of veins: Secondary | ICD-10-CM | POA: Diagnosis not present

## 2015-10-29 ENCOUNTER — Telehealth: Payer: Self-pay | Admitting: Hematology

## 2015-10-29 DIAGNOSIS — L89324 Pressure ulcer of left buttock, stage 4: Secondary | ICD-10-CM | POA: Diagnosis not present

## 2015-10-29 DIAGNOSIS — L89894 Pressure ulcer of other site, stage 4: Secondary | ICD-10-CM | POA: Diagnosis not present

## 2015-10-29 NOTE — Telephone Encounter (Signed)
pt cld to r/s appt-per pt req to 6/23-gave 1:15lab appt1:45inj-adv to spk w/Dr Fengs nurse to see if others need to be moved

## 2015-10-30 ENCOUNTER — Ambulatory Visit: Payer: Medicare Other

## 2015-10-30 ENCOUNTER — Other Ambulatory Visit: Payer: Medicare Other

## 2015-11-02 ENCOUNTER — Ambulatory Visit: Payer: Medicare Other | Admitting: Infectious Diseases

## 2015-11-02 DIAGNOSIS — L89309 Pressure ulcer of unspecified buttock, unspecified stage: Secondary | ICD-10-CM | POA: Diagnosis not present

## 2015-11-05 ENCOUNTER — Other Ambulatory Visit: Payer: Self-pay | Admitting: *Deleted

## 2015-11-05 DIAGNOSIS — D509 Iron deficiency anemia, unspecified: Secondary | ICD-10-CM

## 2015-11-06 ENCOUNTER — Encounter (HOSPITAL_COMMUNITY): Payer: Self-pay | Admitting: *Deleted

## 2015-11-06 ENCOUNTER — Telehealth: Payer: Self-pay | Admitting: *Deleted

## 2015-11-06 ENCOUNTER — Emergency Department (HOSPITAL_COMMUNITY)
Admission: EM | Admit: 2015-11-06 | Discharge: 2015-11-06 | Disposition: A | Discharge status: Home / Self Care | Payer: Medicare Other | Attending: Emergency Medicine | Admitting: Emergency Medicine

## 2015-11-06 ENCOUNTER — Ambulatory Visit: Payer: Medicare Other

## 2015-11-06 ENCOUNTER — Other Ambulatory Visit (HOSPITAL_BASED_OUTPATIENT_CLINIC_OR_DEPARTMENT_OTHER): Payer: Medicare Other

## 2015-11-06 ENCOUNTER — Emergency Department (HOSPITAL_COMMUNITY): Payer: Medicare Other

## 2015-11-06 DIAGNOSIS — I10 Essential (primary) hypertension: Secondary | ICD-10-CM | POA: Diagnosis not present

## 2015-11-06 DIAGNOSIS — F329 Major depressive disorder, single episode, unspecified: Secondary | ICD-10-CM | POA: Diagnosis not present

## 2015-11-06 DIAGNOSIS — D509 Iron deficiency anemia, unspecified: Secondary | ICD-10-CM

## 2015-11-06 DIAGNOSIS — R197 Diarrhea, unspecified: Secondary | ICD-10-CM | POA: Diagnosis not present

## 2015-11-06 DIAGNOSIS — Z79899 Other long term (current) drug therapy: Secondary | ICD-10-CM | POA: Diagnosis not present

## 2015-11-06 DIAGNOSIS — N281 Cyst of kidney, acquired: Secondary | ICD-10-CM | POA: Diagnosis not present

## 2015-11-06 DIAGNOSIS — N2889 Other specified disorders of kidney and ureter: Secondary | ICD-10-CM | POA: Diagnosis not present

## 2015-11-06 LAB — COMPREHENSIVE METABOLIC PANEL
ALT: 17 U/L (ref 17–63)
ANION GAP: 9 (ref 5–15)
AST: 20 U/L (ref 15–41)
Albumin: 3.4 g/dL — ABNORMAL LOW (ref 3.5–5.0)
Alkaline Phosphatase: 87 U/L (ref 38–126)
BILIRUBIN TOTAL: 0.5 mg/dL (ref 0.3–1.2)
BUN: 15 mg/dL (ref 6–20)
CO2: 24 mmol/L (ref 22–32)
Calcium: 9.3 mg/dL (ref 8.9–10.3)
Chloride: 108 mmol/L (ref 101–111)
Creatinine, Ser: 0.5 mg/dL — ABNORMAL LOW (ref 0.61–1.24)
GFR calc Af Amer: >60 mL/min (ref >60)
Glucose, Bld: 92 mg/dL (ref 65–99)
POTASSIUM: 4.4 mmol/L (ref 3.5–5.1)
Sodium: 141 mmol/L (ref 135–145)
TOTAL PROTEIN: 9.2 g/dL — AB (ref 6.5–8.1)

## 2015-11-06 LAB — URINALYSIS, ROUTINE W REFLEX MICROSCOPIC
Bilirubin Urine: NEGATIVE
Glucose, UA: NEGATIVE mg/dL
Hgb urine dipstick: NEGATIVE
KETONES UR: NEGATIVE mg/dL
NITRITE: NEGATIVE
PH: 8.5 — AB (ref 5.0–8.0)
PROTEIN: 100 mg/dL — AB
Specific Gravity, Urine: 1.021 (ref 1.005–1.030)

## 2015-11-06 LAB — CBC WITH DIFFERENTIAL/PLATELET
BASO%: 0.4 % (ref 0.0–2.0)
Basophils Absolute: 0 10*3/uL (ref 0.0–0.1)
EOS%: 2.5 % (ref 0.0–7.0)
Eosinophils Absolute: 0.3 10*3/uL (ref 0.0–0.5)
HCT: 35.9 % — ABNORMAL LOW (ref 38.4–49.9)
HGB: 11.1 g/dL — ABNORMAL LOW (ref 13.0–17.1)
LYMPH%: 14 % (ref 14.0–49.0)
MCH: 25.2 pg — ABNORMAL LOW (ref 27.2–33.4)
MCHC: 30.9 g/dL — AB (ref 32.0–36.0)
MCV: 81.4 fL (ref 79.3–98.0)
MONO#: 1.4 10*3/uL — ABNORMAL HIGH (ref 0.1–0.9)
MONO%: 12.9 % (ref 0.0–14.0)
NEUT#: 7.6 10*3/uL — ABNORMAL HIGH (ref 1.5–6.5)
NEUT%: 70.2 % (ref 39.0–75.0)
PLATELETS: 477 10*3/uL — AB (ref 140–400)
RBC: 4.41 10*6/uL (ref 4.20–5.82)
RDW: 16.3 % — AB (ref 11.0–14.6)
WBC: 10.9 10*3/uL — AB (ref 4.0–10.3)
lymph#: 1.5 10*3/uL (ref 0.9–3.3)
nRBC: 0 % (ref 0–0)

## 2015-11-06 LAB — URINE MICROSCOPIC-ADD ON

## 2015-11-06 LAB — CBC
HEMATOCRIT: 34.7 % — AB (ref 39.0–52.0)
Hemoglobin: 10.8 g/dL — ABNORMAL LOW (ref 13.0–17.0)
MCH: 25.5 pg — ABNORMAL LOW (ref 26.0–34.0)
MCHC: 31.1 g/dL (ref 30.0–36.0)
MCV: 81.8 fL (ref 78.0–100.0)
Platelets: 514 10*3/uL — ABNORMAL HIGH (ref 150–400)
RBC: 4.24 MIL/uL (ref 4.22–5.81)
RDW: 16.4 % — AB (ref 11.5–15.5)
WBC: 13.8 10*3/uL — AB (ref 4.0–10.5)

## 2015-11-06 LAB — LIPASE, BLOOD: Lipase: 34 U/L (ref 11–51)

## 2015-11-06 MED ORDER — IOPAMIDOL (ISOVUE-300) INJECTION 61%
100.0000 mL | Freq: Once | INTRAVENOUS | Status: AC | PRN
  Administered 2015-11-06: 100 mL via INTRAVENOUS
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
## 2015-11-06 NOTE — ED Notes (Signed)
RN to attempt ultrasound Lab draw and access.

## 2015-11-06 NOTE — ED Notes (Signed)
Pt has concerns about getting home d/t need for [facility] bus to transport pt and chair home.  Agricultural consultant, [place], and CT made aware.

## 2015-11-06 NOTE — ED Notes (Signed)
Pt at CT

## 2015-11-06 NOTE — ED Notes (Signed)
Providers ok to discharge pt after a re-check of pt's BP of 91/71

## 2015-11-06 NOTE — ED Notes (Signed)
Pt left prior to signing for discharge instructions.

## 2015-11-06 NOTE — ED Notes (Signed)
Pt normally needs ultrasound IV blood work will be obtained in acute room per [employer]

## 2015-11-06 NOTE — Telephone Encounter (Signed)
Pt here for CBC prior to Aranesp injection.  Received call from Maudie Mercury, lab scheduler that pt requested to have C. Diff test to be done since pt had just completed Antibiotics and now has diarrhea.   Per pt, Antibiotics were given by Dr. Wendie Agreste, [place] provider.  Consulted with Jenny Reichmann, NP symptom management provider.  Per Jenny Reichmann, NP - pt needs to contact ID provider and/or his PCP for the diarrhea problem.  Spoke with pt and gave pt instructions as per NP.   Pt voiced understanding.

## 2015-11-06 NOTE — ED Notes (Addendum)
Dr. Alvino Chapel and Gertie Fey, [place] made aware of pt's BP

## 2015-11-06 NOTE — Discharge Instructions (Signed)
Diarrhea  Diarrhea is watery poop (stool). It can make you feel weak, tired, thirsty, or give you a dry mouth (signs of dehydration). Watery poop is a sign of another problem, most often an infection. It often lasts 2-3 days. It can last longer if it is a sign of something serious. Take care of yourself as told by your doctor.  HOME CARE   · Drink 1 cup (8 ounces) of fluid each time you have watery poop.  · Do not drink the following fluids:    Those that contain simple sugars (fructose, glucose, galactose, lactose, sucrose, maltose).    Sports drinks.    Fruit juices.    Whole milk products.    Sodas.    Drinks with caffeine (coffee, tea, soda) or alcohol.  · Oral rehydration solution may be used if the doctor says it is okay. You may make your own solution. Follow this recipe:    ?-? teaspoon table salt.    ¾ teaspoon baking soda.    ? teaspoon salt substitute containing potassium chloride.    1 ? tablespoons sugar.    1 liter (34 ounces) of water.  · Avoid the following foods:    High fiber foods, such as raw fruits and vegetables.    Nuts, seeds, and whole grain breads and cereals.     Those that are sweetened with sugar alcohols (xylitol, sorbitol, mannitol).  · Try eating the following foods:    Starchy foods, such as rice, toast, pasta, low-sugar cereal, oatmeal, baked potatoes, crackers, and bagels.    Bananas.    Applesauce.  · Eat probiotic-rich foods, such as yogurt and milk products that are fermented.  · Wash your hands well after each time you have watery poop.  · Only take medicine as told by your doctor.  · Take a warm bath to help lessen burning or pain from having watery poop.  GET HELP RIGHT AWAY IF:   · You cannot drink fluids without throwing up (vomiting).  · You keep throwing up.  · You have blood in your poop, or your poop looks black and tarry.  · You do not pee (urinate) in 6-8 hours, or there is only a small amount of very dark pee.  · You have belly (abdominal) pain that gets worse or  stays in the same spot (localizes).  · You are weak, dizzy, confused, or light-headed.  · You have a very bad headache.  · Your watery poop gets worse or does not get better.  · You have a fever or lasting symptoms for more than 2-3 days.  · You have a fever and your symptoms suddenly get worse.  MAKE SURE YOU:   · Understand these instructions.  · Will watch your condition.  · Will get help right away if you are not doing well or get worse.     This information is not intended to replace advice given to you by your health care provider. Make sure you discuss any questions you have with your health care provider.     Document Released: 10/19/2007 Document Revised: 05/23/2014 Document Reviewed: 01/08/2012  Elsevier Interactive Patient Education ©2016 [employer]

## 2015-11-06 NOTE — ED Notes (Signed)
PA at bedside.

## 2015-11-06 NOTE — Progress Notes (Signed)
Hemoglobin on labs today is 11.1. Injection not given. Pt given copy of current labs, copy of current schedule and instructed to follow current schedule and call clinic if issues occur.

## 2015-11-06 NOTE — ED Notes (Addendum)
phlebotomyTo draw Labs

## 2015-11-06 NOTE — ED Notes (Addendum)
3 staff persons transferred pt to stretcher w/o difficulty.    Suprapubic catheter in place. Clean dry and intact.  Ileostomy in place. Bag is empty. Stoma Pink and Moist.

## 2015-11-06 NOTE — ED Notes (Addendum)
Pt complains of diarrhea for the past 3 days. Pt states he went off antibiotics 2 weeks ago and was told to come back if he has diarrhea. Pt states he has had C. Diff in the past. Pt denies abd pain, nausea, emesis. Pt states he had one episode of diarrhea this morning.

## 2015-11-07 LAB — URINE CULTURE

## 2015-11-12 DIAGNOSIS — L89104 Pressure ulcer of unspecified part of back, stage 4: Secondary | ICD-10-CM | POA: Diagnosis not present

## 2015-11-12 DIAGNOSIS — N319 Neuromuscular dysfunction of bladder, unspecified: Secondary | ICD-10-CM | POA: Diagnosis not present

## 2015-11-12 DIAGNOSIS — L89324 Pressure ulcer of left buttock, stage 4: Secondary | ICD-10-CM | POA: Diagnosis not present

## 2015-11-12 DIAGNOSIS — L89894 Pressure ulcer of other site, stage 4: Secondary | ICD-10-CM | POA: Diagnosis not present

## 2015-11-12 DIAGNOSIS — L89219 Pressure ulcer of right hip, unspecified stage: Secondary | ICD-10-CM | POA: Diagnosis not present

## 2015-11-13 DIAGNOSIS — L89309 Pressure ulcer of unspecified buttock, unspecified stage: Secondary | ICD-10-CM | POA: Diagnosis not present

## 2015-11-19 ENCOUNTER — Encounter (HOSPITAL_COMMUNITY): Payer: Self-pay | Admitting: Emergency Medicine

## 2015-11-19 ENCOUNTER — Emergency Department (HOSPITAL_COMMUNITY): Payer: Medicare Other

## 2015-11-19 ENCOUNTER — Inpatient Hospital Stay (HOSPITAL_COMMUNITY)
Admission: EM | Admit: 2015-11-19 | Discharge: 2015-11-27 | DRG: 871 | Disposition: A | Discharge status: Skilled Nursing Facility | Payer: Medicare Other | Attending: Internal Medicine | Admitting: Internal Medicine

## 2015-11-19 DIAGNOSIS — S31000A Unspecified open wound of lower back and pelvis without penetration into retroperitoneum, initial encounter: Secondary | ICD-10-CM | POA: Diagnosis not present

## 2015-11-19 DIAGNOSIS — L89159 Pressure ulcer of sacral region, unspecified stage: Secondary | ICD-10-CM | POA: Diagnosis not present

## 2015-11-19 DIAGNOSIS — M86651 Other chronic osteomyelitis, right thigh: Secondary | ICD-10-CM | POA: Diagnosis not present

## 2015-11-19 DIAGNOSIS — E876 Hypokalemia: Secondary | ICD-10-CM | POA: Diagnosis present

## 2015-11-19 DIAGNOSIS — F329 Major depressive disorder, single episode, unspecified: Secondary | ICD-10-CM | POA: Diagnosis present

## 2015-11-19 DIAGNOSIS — R05 Cough: Secondary | ICD-10-CM

## 2015-11-19 DIAGNOSIS — D509 Iron deficiency anemia, unspecified: Secondary | ICD-10-CM | POA: Diagnosis present

## 2015-11-19 DIAGNOSIS — N200 Calculus of kidney: Secondary | ICD-10-CM

## 2015-11-19 DIAGNOSIS — Y95 Nosocomial condition: Secondary | ICD-10-CM | POA: Diagnosis present

## 2015-11-19 DIAGNOSIS — A419 Sepsis, unspecified organism: Secondary | ICD-10-CM | POA: Diagnosis present

## 2015-11-19 DIAGNOSIS — D649 Anemia, unspecified: Secondary | ICD-10-CM | POA: Diagnosis not present

## 2015-11-19 DIAGNOSIS — Z981 Arthrodesis status: Secondary | ICD-10-CM

## 2015-11-19 DIAGNOSIS — M86151 Other acute osteomyelitis, right femur: Secondary | ICD-10-CM | POA: Diagnosis not present

## 2015-11-19 DIAGNOSIS — G8250 Quadriplegia, unspecified: Secondary | ICD-10-CM | POA: Diagnosis present

## 2015-11-19 DIAGNOSIS — M86659 Other chronic osteomyelitis, unspecified thigh: Secondary | ICD-10-CM | POA: Diagnosis not present

## 2015-11-19 DIAGNOSIS — Z6824 Body mass index (BMI) 24.0-24.9, adult: Secondary | ICD-10-CM | POA: Diagnosis not present

## 2015-11-19 DIAGNOSIS — Z9989 Dependence on other enabling machines and devices: Secondary | ICD-10-CM

## 2015-11-19 DIAGNOSIS — E43 Unspecified severe protein-calorie malnutrition: Secondary | ICD-10-CM | POA: Diagnosis not present

## 2015-11-19 DIAGNOSIS — L89103 Pressure ulcer of unspecified part of back, stage 3: Secondary | ICD-10-CM | POA: Diagnosis not present

## 2015-11-19 DIAGNOSIS — G4733 Obstructive sleep apnea (adult) (pediatric): Secondary | ICD-10-CM | POA: Diagnosis not present

## 2015-11-19 DIAGNOSIS — D638 Anemia in other chronic diseases classified elsewhere: Secondary | ICD-10-CM | POA: Diagnosis present

## 2015-11-19 DIAGNOSIS — I10 Essential (primary) hypertension: Secondary | ICD-10-CM | POA: Diagnosis present

## 2015-11-19 DIAGNOSIS — R918 Other nonspecific abnormal finding of lung field: Secondary | ICD-10-CM | POA: Diagnosis not present

## 2015-11-19 DIAGNOSIS — K219 Gastro-esophageal reflux disease without esophagitis: Secondary | ICD-10-CM | POA: Diagnosis present

## 2015-11-19 DIAGNOSIS — Z8711 Personal history of peptic ulcer disease: Secondary | ICD-10-CM

## 2015-11-19 DIAGNOSIS — R6520 Severe sepsis without septic shock: Secondary | ICD-10-CM | POA: Diagnosis not present

## 2015-11-19 DIAGNOSIS — K3184 Gastroparesis: Secondary | ICD-10-CM | POA: Diagnosis present

## 2015-11-19 DIAGNOSIS — T83198A Other mechanical complication of other urinary devices and implants, initial encounter: Secondary | ICD-10-CM | POA: Diagnosis not present

## 2015-11-19 DIAGNOSIS — L89214 Pressure ulcer of right hip, stage 4: Secondary | ICD-10-CM | POA: Diagnosis not present

## 2015-11-19 DIAGNOSIS — Z7409 Other reduced mobility: Secondary | ICD-10-CM | POA: Diagnosis not present

## 2015-11-19 DIAGNOSIS — Z933 Colostomy status: Secondary | ICD-10-CM

## 2015-11-19 DIAGNOSIS — M8668 Other chronic osteomyelitis, other site: Secondary | ICD-10-CM | POA: Diagnosis not present

## 2015-11-19 DIAGNOSIS — Z993 Dependence on wheelchair: Secondary | ICD-10-CM | POA: Diagnosis not present

## 2015-11-19 DIAGNOSIS — Z79899 Other long term (current) drug therapy: Secondary | ICD-10-CM | POA: Diagnosis not present

## 2015-11-19 DIAGNOSIS — E46 Unspecified protein-calorie malnutrition: Secondary | ICD-10-CM | POA: Diagnosis not present

## 2015-11-19 DIAGNOSIS — B9562 Methicillin resistant Staphylococcus aureus infection as the cause of diseases classified elsewhere: Secondary | ICD-10-CM | POA: Diagnosis present

## 2015-11-19 DIAGNOSIS — N390 Urinary tract infection, site not specified: Secondary | ICD-10-CM | POA: Diagnosis present

## 2015-11-19 DIAGNOSIS — L89303 Pressure ulcer of unspecified buttock, stage 3: Secondary | ICD-10-CM | POA: Diagnosis present

## 2015-11-19 DIAGNOSIS — L89892 Pressure ulcer of other site, stage 2: Secondary | ICD-10-CM | POA: Diagnosis present

## 2015-11-19 DIAGNOSIS — R059 Cough, unspecified: Secondary | ICD-10-CM

## 2015-11-19 DIAGNOSIS — M8610 Other acute osteomyelitis, unspecified site: Secondary | ICD-10-CM

## 2015-11-19 DIAGNOSIS — J189 Pneumonia, unspecified organism: Secondary | ICD-10-CM | POA: Diagnosis present

## 2015-11-19 DIAGNOSIS — M8660 Other chronic osteomyelitis, unspecified site: Secondary | ICD-10-CM | POA: Diagnosis not present

## 2015-11-19 DIAGNOSIS — G4730 Sleep apnea, unspecified: Secondary | ICD-10-CM | POA: Diagnosis not present

## 2015-11-19 DIAGNOSIS — Z433 Encounter for attention to colostomy: Secondary | ICD-10-CM | POA: Diagnosis not present

## 2015-11-19 DIAGNOSIS — Z7401 Bed confinement status: Secondary | ICD-10-CM | POA: Diagnosis not present

## 2015-11-19 DIAGNOSIS — G8253 Quadriplegia, C5-C7 complete: Secondary | ICD-10-CM | POA: Diagnosis not present

## 2015-11-19 DIAGNOSIS — M6281 Muscle weakness (generalized): Secondary | ICD-10-CM | POA: Diagnosis not present

## 2015-11-19 DIAGNOSIS — A4102 Sepsis due to Methicillin resistant Staphylococcus aureus: Secondary | ICD-10-CM | POA: Diagnosis not present

## 2015-11-19 DIAGNOSIS — N318 Other neuromuscular dysfunction of bladder: Secondary | ICD-10-CM | POA: Diagnosis not present

## 2015-11-19 DIAGNOSIS — L8992 Pressure ulcer of unspecified site, stage 2: Secondary | ICD-10-CM | POA: Diagnosis not present

## 2015-11-19 DIAGNOSIS — L89154 Pressure ulcer of sacral region, stage 4: Secondary | ICD-10-CM | POA: Diagnosis not present

## 2015-11-19 DIAGNOSIS — T83498A Other mechanical complication of other prosthetic devices, implants and grafts of genital tract, initial encounter: Secondary | ICD-10-CM | POA: Diagnosis not present

## 2015-11-19 DIAGNOSIS — R935 Abnormal findings on diagnostic imaging of other abdominal regions, including retroperitoneum: Secondary | ICD-10-CM | POA: Diagnosis not present

## 2015-11-19 LAB — COMPREHENSIVE METABOLIC PANEL
ALK PHOS: 87 U/L (ref 38–126)
ALT: 14 U/L — AB (ref 17–63)
ALT: 12 U/L — AB (ref 17–63)
AST: 57 U/L — AB (ref 15–41)
AST: 16 U/L (ref 15–41)
Albumin: 2.5 g/dL — ABNORMAL LOW (ref 3.5–5.0)
Albumin: 2.1 g/dL — ABNORMAL LOW (ref 3.5–5.0)
Alkaline Phosphatase: 78 U/L (ref 38–126)
Anion gap: 8 (ref 5–15)
Anion gap: 8 (ref 5–15)
BILIRUBIN TOTAL: 2.2 mg/dL — AB (ref 0.3–1.2)
BUN: 12 mg/dL (ref 6–20)
BUN: 11 mg/dL (ref 6–20)
CALCIUM: 8.8 mg/dL — AB (ref 8.9–10.3)
CHLORIDE: 103 mmol/L (ref 101–111)
CHLORIDE: 104 mmol/L (ref 101–111)
CO2: 20 mmol/L — ABNORMAL LOW (ref 22–32)
CO2: 23 mmol/L (ref 22–32)
CREATININE: 0.53 mg/dL — AB (ref 0.61–1.24)
CREATININE: 0.48 mg/dL — AB (ref 0.61–1.24)
Calcium: 8.9 mg/dL (ref 8.9–10.3)
GFR calc Af Amer: >60 mL/min (ref >60)
Glucose, Bld: 97 mg/dL (ref 65–99)
Glucose, Bld: 95 mg/dL (ref 65–99)
Potassium: >7.5 mmol/L (ref 3.5–5.1)
Potassium: 4.4 mmol/L (ref 3.5–5.1)
Sodium: 131 mmol/L — ABNORMAL LOW (ref 135–145)
Sodium: 135 mmol/L (ref 135–145)
Total Bilirubin: 0.6 mg/dL (ref 0.3–1.2)
Total Protein: 8 g/dL (ref 6.5–8.1)
Total Protein: 7.6 g/dL (ref 6.5–8.1)

## 2015-11-19 LAB — URINALYSIS, ROUTINE W REFLEX MICROSCOPIC
Bilirubin Urine: NEGATIVE
GLUCOSE, UA: NEGATIVE mg/dL
HGB URINE DIPSTICK: NEGATIVE
KETONES UR: 15 mg/dL — AB
Nitrite: NEGATIVE
PROTEIN: 100 mg/dL — AB
Specific Gravity, Urine: 1.022 (ref 1.005–1.030)
pH: 8.5 — ABNORMAL HIGH (ref 5.0–8.0)

## 2015-11-19 LAB — CBC WITH DIFFERENTIAL/PLATELET
BASOS ABS: 0 10*3/uL (ref 0.0–0.1)
Basophils Relative: 0 %
EOS ABS: 0.5 10*3/uL (ref 0.0–0.7)
EOS PCT: 3 %
HCT: 31.6 % — ABNORMAL LOW (ref 39.0–52.0)
Hemoglobin: 9.7 g/dL — ABNORMAL LOW (ref 13.0–17.0)
LYMPHS PCT: 7 %
Lymphs Abs: 1.1 10*3/uL (ref 0.7–4.0)
MCH: 24.7 pg — AB (ref 26.0–34.0)
MCHC: 30.7 g/dL (ref 30.0–36.0)
MCV: 80.4 fL (ref 78.0–100.0)
MONO ABS: 1.8 10*3/uL — AB (ref 0.1–1.0)
Monocytes Relative: 12 %
Neutro Abs: 12.3 10*3/uL — ABNORMAL HIGH (ref 1.7–7.7)
Neutrophils Relative %: 78 %
PLATELETS: 557 10*3/uL — AB (ref 150–400)
RBC: 3.93 MIL/uL — ABNORMAL LOW (ref 4.22–5.81)
RDW: 16 % — AB (ref 11.5–15.5)
WBC: 15.7 10*3/uL — AB (ref 4.0–10.5)

## 2015-11-19 LAB — URINE MICROSCOPIC-ADD ON: RBC / HPF: NONE SEEN RBC/hpf (ref 0–5)

## 2015-11-19 LAB — I-STAT CHEM 8, ED
BUN: 12 mg/dL (ref 6–20)
CHLORIDE: 103 mmol/L (ref 101–111)
CREATININE: 0.5 mg/dL — AB (ref 0.61–1.24)
Calcium, Ion: 1.18 mmol/L (ref 1.13–1.30)
Glucose, Bld: 103 mg/dL — ABNORMAL HIGH (ref 65–99)
HEMATOCRIT: 31 % — AB (ref 39.0–52.0)
HEMOGLOBIN: 10.5 g/dL — AB (ref 13.0–17.0)
POTASSIUM: 4.5 mmol/L (ref 3.5–5.1)
Sodium: 137 mmol/L (ref 135–145)
TCO2: 24 mmol/L (ref 0–100)

## 2015-11-19 LAB — I-STAT CG4 LACTIC ACID, ED: LACTIC ACID, VENOUS: 1.45 mmol/L (ref 0.5–1.9)

## 2015-11-19 MED ORDER — SODIUM CHLORIDE 0.9 % IV BOLUS (SEPSIS)
1000.0000 mL | Freq: Once | INTRAVENOUS | Status: AC
  Administered 2015-11-19: 1000 mL via INTRAVENOUS

## 2015-11-19 MED ORDER — VANCOMYCIN HCL 10 G IV SOLR
1250.0000 mg | Freq: Two times a day (BID) | INTRAVENOUS | Status: DC
  Administered 2015-11-20 – 2015-11-22 (×6): 1250 mg via INTRAVENOUS
  Filled 2015-11-19 (×8): qty 1250

## 2015-11-19 MED ORDER — PIPERACILLIN-TAZOBACTAM 3.375 G IVPB
3.3750 g | Freq: Three times a day (TID) | INTRAVENOUS | Status: DC
  Administered 2015-11-20 – 2015-11-26 (×19): 3.375 g via INTRAVENOUS
  Filled 2015-11-19 (×21): qty 50

## 2015-11-19 MED ORDER — PIPERACILLIN-TAZOBACTAM 3.375 G IVPB 30 MIN
3.3750 g | Freq: Once | INTRAVENOUS | Status: AC
  Administered 2015-11-19: 3.375 g via INTRAVENOUS
  Filled 2015-11-19: qty 50

## 2015-11-19 MED ORDER — VANCOMYCIN HCL 10 G IV SOLR
2000.0000 mg | Freq: Once | INTRAVENOUS | Status: AC
  Administered 2015-11-19: 2000 mg via INTRAVENOUS
  Filled 2015-11-19: qty 2000

## 2015-11-19 NOTE — Progress Notes (Signed)
Pharmacy Antibiotic Note  [name] [name] is a 50 y.o. male admitted on 11/19/2015 with sepsis.  Pharmacy has been consulted for vancomycin and zosyn dosing. Pt is afebrile and WBC is elevated at 15.7. SCr is 0.53 which is slightly above his baseline. Pt is a quad so renal function is difficult to estimate. Pt was recently on cefepime and flagyl for osteomyelitis.   Plan: - Vancomycin 2gm IV x 1 then 1250mg  IV Q12H - Zosyn 3.375gm IV Q8H (4 hr inf) - F/u renal fxn, C&S, clinical status and trough at SS     Temp (24hrs), Avg:99 F (37.2 C), Min:99 F (37.2 C), Max:99 F (37.2 C)   Recent Labs Lab 11/19/15 2048  WBC 15.7*  CREATININE 0.53*    CrCl cannot be calculated (Unknown ideal weight.).    Allergies  Allergen Reactions  . Ditropan [Oxybutynin] Other (See Comments)    hallucinations  . Feraheme [Ferumoxytol]     Antimicrobials this admission: Vanc 7/6>> Zosyn 7/6>>  Dose adjustments this admission: N/A  Microbiology results: Pending  Thank you for allowing pharmacy to be a part of this patient's care.  Aine Strycharz, Rande Lawman 11/19/2015 9:50 PM
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
## 2015-11-19 NOTE — ED Notes (Signed)
Pt back in room.

## 2015-11-19 NOTE — ED Notes (Signed)
Md at bedside attempting ultrasound IV.

## 2015-11-19 NOTE — ED Notes (Signed)
MD at bedside. Surgery
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
## 2015-11-19 NOTE — ED Notes (Signed)
[facility] contacted to page Code Sepsis 

## 2015-11-19 NOTE — ED Notes (Signed)
Patient transported to CT 

## 2015-11-19 NOTE — ED Notes (Signed)
MD aware of critical. CMP redrawn for verification.

## 2015-11-19 NOTE — ED Notes (Signed)
Pt arrived to ED via EMS. C/o congestion starting over the weekend. Paraplegic and unable to produce effective cough. Hx of osteomyelitis. Lung sounds diminished. C/o smell with bed sores. Has catheter, last changed on 6/30 but urine is cloudy with sediment.

## 2015-11-20 DIAGNOSIS — G4733 Obstructive sleep apnea (adult) (pediatric): Secondary | ICD-10-CM

## 2015-11-20 LAB — BLOOD CULTURE ID PANEL (REFLEXED)
Acinetobacter baumannii: NOT DETECTED
CANDIDA ALBICANS: NOT DETECTED
CANDIDA PARAPSILOSIS: NOT DETECTED
Candida glabrata: NOT DETECTED
Candida krusei: NOT DETECTED
Candida tropicalis: NOT DETECTED
Carbapenem resistance: NOT DETECTED
ENTEROBACTER CLOACAE COMPLEX: NOT DETECTED
ENTEROBACTERIACEAE SPECIES: NOT DETECTED
ENTEROCOCCUS SPECIES: NOT DETECTED
Escherichia coli: NOT DETECTED
Haemophilus influenzae: NOT DETECTED
KLEBSIELLA OXYTOCA: NOT DETECTED
KLEBSIELLA PNEUMONIAE: NOT DETECTED
Listeria monocytogenes: NOT DETECTED
Methicillin resistance: DETECTED — AB
Neisseria meningitidis: NOT DETECTED
PSEUDOMONAS AERUGINOSA: NOT DETECTED
Proteus species: NOT DETECTED
STAPHYLOCOCCUS AUREUS BCID: NOT DETECTED
STREPTOCOCCUS AGALACTIAE: NOT DETECTED
STREPTOCOCCUS PNEUMONIAE: NOT DETECTED
Serratia marcescens: NOT DETECTED
Staphylococcus species: DETECTED — AB
Streptococcus pyogenes: NOT DETECTED
Streptococcus species: NOT DETECTED
VANCOMYCIN RESISTANCE: NOT DETECTED

## 2015-11-20 LAB — CBC
HCT: 47.7 % (ref 39.0–52.0)
HEMATOCRIT: 25.7 % — AB (ref 39.0–52.0)
Hemoglobin: 7.9 g/dL — ABNORMAL LOW (ref 13.0–17.0)
Hemoglobin: 14.1 g/dL (ref 13.0–17.0)
MCH: 25.2 pg — ABNORMAL LOW (ref 26.0–34.0)
MCH: 24.1 pg — ABNORMAL LOW (ref 26.0–34.0)
MCHC: 30.7 g/dL (ref 30.0–36.0)
MCHC: 29.6 g/dL — ABNORMAL LOW (ref 30.0–36.0)
MCV: 81.8 fL (ref 78.0–100.0)
MCV: 81.5 fL (ref 78.0–100.0)
PLATELETS: 426 10*3/uL — AB (ref 150–400)
PLATELETS: 190 10*3/uL (ref 150–400)
RBC: 3.14 MIL/uL — ABNORMAL LOW (ref 4.22–5.81)
RBC: 5.85 MIL/uL — AB (ref 4.22–5.81)
RDW: 15.6 % — AB (ref 11.5–15.5)
RDW: 15.7 % — AB (ref 11.5–15.5)
WBC: 12.9 10*3/uL — AB (ref 4.0–10.5)
WBC: 6.7 10*3/uL (ref 4.0–10.5)

## 2015-11-20 LAB — RESPIRATORY PANEL BY PCR
ADENOVIRUS-RVPPCR: NOT DETECTED
BORDETELLA PERTUSSIS-RVPCR: NOT DETECTED
CORONAVIRUS OC43-RVPPCR: NOT DETECTED
Chlamydophila pneumoniae: NOT DETECTED
Coronavirus 229E: NOT DETECTED
Coronavirus HKU1: NOT DETECTED
Coronavirus NL63: NOT DETECTED
INFLUENZA A H1 2009-RVPPR: NOT DETECTED
INFLUENZA A H1-RVPPCR: NOT DETECTED
INFLUENZA A-RVPPCR: NOT DETECTED
Influenza A H3: NOT DETECTED
Influenza B: NOT DETECTED
METAPNEUMOVIRUS-RVPPCR: NOT DETECTED
MYCOPLASMA PNEUMONIAE-RVPPCR: NOT DETECTED
PARAINFLUENZA VIRUS 2-RVPPCR: NOT DETECTED
PARAINFLUENZA VIRUS 4-RVPPCR: NOT DETECTED
Parainfluenza Virus 1: NOT DETECTED
Parainfluenza Virus 3: NOT DETECTED
RESPIRATORY SYNCYTIAL VIRUS-RVPPCR: NOT DETECTED
Rhinovirus / Enterovirus: DETECTED — AB

## 2015-11-20 LAB — BASIC METABOLIC PANEL
Anion gap: 7 (ref 5–15)
BUN: 9 mg/dL (ref 6–20)
CALCIUM: 8.3 mg/dL — AB (ref 8.9–10.3)
CO2: 22 mmol/L (ref 22–32)
CREATININE: 0.38 mg/dL — AB (ref 0.61–1.24)
Chloride: 110 mmol/L (ref 101–111)
GFR calc Af Amer: >60 mL/min (ref >60)
GLUCOSE: 93 mg/dL (ref 65–99)
POTASSIUM: 3.6 mmol/L (ref 3.5–5.1)
SODIUM: 139 mmol/L (ref 135–145)

## 2015-11-20 LAB — MRSA PCR SCREENING: MRSA BY PCR: INVALID — AB

## 2015-11-20 MED ORDER — ENOXAPARIN SODIUM 40 MG/0.4ML ~~LOC~~ SOLN: 40.0000 mg | Freq: Every day | SUBCUTANEOUS | Status: DC

## 2015-11-20 MED ORDER — ALBUTEROL SULFATE (2.5 MG/3ML) 0.083% IN NEBU
2.5000 mg | INHALATION_SOLUTION | RESPIRATORY_TRACT | Status: DC | PRN
  Administered 2015-11-22 (×2): 2.5 mg via RESPIRATORY_TRACT
  Filled 2015-11-20 (×2): qty 3

## 2015-11-20 MED ORDER — ADULT MULTIVITAMIN W/MINERALS CH
1.0000 | ORAL_TABLET | Freq: Every day | ORAL | Status: DC
  Administered 2015-11-21 – 2015-11-27 (×7): 1 via ORAL
  Filled 2015-11-20 (×7): qty 1

## 2015-11-20 MED ORDER — ONDANSETRON HCL 4 MG PO TABS: 4.0000 mg | ORAL_TABLET | Freq: Four times a day (QID) | ORAL | Status: DC | PRN

## 2015-11-20 MED ORDER — SODIUM CHLORIDE 0.9% FLUSH
3.0000 mL | Freq: Two times a day (BID) | INTRAVENOUS | Status: DC
  Administered 2015-11-20 – 2015-11-26 (×9): 3 mL via INTRAVENOUS

## 2015-11-20 MED ORDER — PANTOPRAZOLE SODIUM 40 MG PO TBEC
40.0000 mg | DELAYED_RELEASE_TABLET | Freq: Two times a day (BID) | ORAL | Status: DC
  Administered 2015-11-20 – 2015-11-27 (×14): 40 mg via ORAL
  Filled 2015-11-20 (×15): qty 1

## 2015-11-20 MED ORDER — ALBUTEROL SULFATE (2.5 MG/3ML) 0.083% IN NEBU: 2.5000 mg | INHALATION_SOLUTION | Freq: Four times a day (QID) | RESPIRATORY_TRACT | Status: DC | PRN

## 2015-11-20 MED ORDER — METOCLOPRAMIDE HCL 10 MG PO TABS
10.0000 mg | ORAL_TABLET | Freq: Three times a day (TID) | ORAL | Status: DC
  Administered 2015-11-20 – 2015-11-27 (×29): 10 mg via ORAL
  Filled 2015-11-20 (×30): qty 1

## 2015-11-20 MED ORDER — BACLOFEN 20 MG PO TABS
20.0000 mg | ORAL_TABLET | Freq: Three times a day (TID) | ORAL | Status: DC
  Administered 2015-11-20 – 2015-11-27 (×29): 20 mg via ORAL
  Filled 2015-11-20: qty 2
  Filled 2015-11-20 (×8): qty 1
  Filled 2015-11-20 (×2): qty 2
  Filled 2015-11-20 (×2): qty 1
  Filled 2015-11-20: qty 2
  Filled 2015-11-20 (×8): qty 1
  Filled 2015-11-20: qty 2
  Filled 2015-11-20 (×2): qty 1
  Filled 2015-11-20: qty 2
  Filled 2015-11-20 (×4): qty 1

## 2015-11-20 MED ORDER — DICLOFENAC SODIUM 1 % TD GEL
2.0000 g | Freq: Three times a day (TID) | TRANSDERMAL | Status: DC
  Administered 2015-11-27: 2 g via TOPICAL
  Filled 2015-11-20 (×2): qty 100

## 2015-11-20 MED ORDER — COLLAGENASE 250 UNIT/GM EX OINT
TOPICAL_OINTMENT | Freq: Every day | CUTANEOUS | Status: DC
  Administered 2015-11-20 – 2015-11-22 (×2): via TOPICAL
  Administered 2015-11-27: 1 via TOPICAL
  Filled 2015-11-20 (×2): qty 30

## 2015-11-20 MED ORDER — ONDANSETRON 4 MG PO TBDP: 4.0000 mg | ORAL_TABLET | Freq: Three times a day (TID) | ORAL | Status: DC | PRN

## 2015-11-20 MED ORDER — ACETAMINOPHEN 650 MG RE SUPP: 650.0000 mg | Freq: Four times a day (QID) | RECTAL | Status: DC | PRN

## 2015-11-20 MED ORDER — LIDOCAINE 5 % EX PTCH
1.0000 | MEDICATED_PATCH | CUTANEOUS | Status: DC
  Filled 2015-11-20 (×6): qty 1

## 2015-11-20 MED ORDER — ONDANSETRON HCL 4 MG/2ML IJ SOLN: 4.0000 mg | Freq: Four times a day (QID) | INTRAMUSCULAR | Status: DC | PRN

## 2015-11-20 MED ORDER — SUCRALFATE 1 G PO TABS
1.0000 g | ORAL_TABLET | Freq: Three times a day (TID) | ORAL | Status: DC
  Administered 2015-11-20 – 2015-11-27 (×29): 1 g via ORAL
  Filled 2015-11-20 (×30): qty 1

## 2015-11-20 MED ORDER — JUVEN PO PACK
1.0000 | PACK | Freq: Two times a day (BID) | ORAL | Status: DC
  Administered 2015-11-22 – 2015-11-27 (×11): 1 via ORAL
  Filled 2015-11-20 (×16): qty 1

## 2015-11-20 MED ORDER — DARIFENACIN HYDROBROMIDE ER 15 MG PO TB24
15.0000 mg | ORAL_TABLET | Freq: Every day | ORAL | Status: DC
  Administered 2015-11-20 – 2015-11-27 (×8): 15 mg via ORAL
  Filled 2015-11-20 (×8): qty 1

## 2015-11-20 MED ORDER — BOOST PO LIQD
237.0000 mL | Freq: Three times a day (TID) | ORAL | Status: DC
  Administered 2015-11-20 – 2015-11-27 (×17): 237 mL via ORAL
  Filled 2015-11-20 (×30): qty 237

## 2015-11-20 MED ORDER — FERROUS SULFATE 325 (65 FE) MG PO TABS
325.0000 mg | ORAL_TABLET | Freq: Three times a day (TID) | ORAL | Status: DC
  Administered 2015-11-20 – 2015-11-27 (×22): 325 mg via ORAL
  Filled 2015-11-20 (×23): qty 1

## 2015-11-20 MED ORDER — SODIUM CHLORIDE 0.9 % IV SOLN
INTRAVENOUS | Status: DC
  Administered 2015-11-20 – 2015-11-24 (×3): via INTRAVENOUS

## 2015-11-20 MED ORDER — ACETAMINOPHEN 325 MG PO TABS
650.0000 mg | ORAL_TABLET | Freq: Four times a day (QID) | ORAL | Status: DC | PRN
  Administered 2015-11-21 – 2015-11-25 (×3): 650 mg via ORAL
  Filled 2015-11-20 (×3): qty 2

## 2015-11-20 NOTE — Consult Note (Signed)
Patient well known to [facility] Team, patient is pending surgical consultation per plastics. Has been followed by [name] in the past.  [facility] will not consult for this reason.    Re consult if needed, will not follow at this time. Thanks  Genesys Coggeshall [employer], RN,CNS, [place] [phone])
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
## 2015-11-20 NOTE — Progress Notes (Signed)
PHARMACY - PHYSICIAN COMMUNICATION CRITICAL VALUE ALERT - BLOOD CULTURE IDENTIFICATION (BCID)  Results for orders placed or performed during the hospital encounter of 11/19/15  Blood Culture ID Panel (Reflexed) (Collected: 11/19/2015  9:20 PM)  Result Value Ref Range   Enterococcus species NOT DETECTED NOT DETECTED   Vancomycin resistance NOT DETECTED NOT DETECTED   Listeria monocytogenes NOT DETECTED NOT DETECTED   Staphylococcus species DETECTED (A) NOT DETECTED   Staphylococcus aureus NOT DETECTED NOT DETECTED   Methicillin resistance DETECTED (A) NOT DETECTED   Streptococcus species NOT DETECTED NOT DETECTED   Streptococcus agalactiae NOT DETECTED NOT DETECTED   Streptococcus pneumoniae NOT DETECTED NOT DETECTED   Streptococcus pyogenes NOT DETECTED NOT DETECTED   Acinetobacter baumannii NOT DETECTED NOT DETECTED   Enterobacteriaceae species NOT DETECTED NOT DETECTED   Enterobacter cloacae complex NOT DETECTED NOT DETECTED   Escherichia coli NOT DETECTED NOT DETECTED   Klebsiella oxytoca NOT DETECTED NOT DETECTED   Klebsiella pneumoniae NOT DETECTED NOT DETECTED   Proteus species NOT DETECTED NOT DETECTED   Serratia marcescens NOT DETECTED NOT DETECTED   Carbapenem resistance NOT DETECTED NOT DETECTED   Haemophilus influenzae NOT DETECTED NOT DETECTED   Neisseria meningitidis NOT DETECTED NOT DETECTED   Pseudomonas aeruginosa NOT DETECTED NOT DETECTED   Candida albicans NOT DETECTED NOT DETECTED   Candida glabrata NOT DETECTED NOT DETECTED   Candida krusei NOT DETECTED NOT DETECTED   Candida parapsilosis NOT DETECTED NOT DETECTED   Candida tropicalis NOT DETECTED NOT DETECTED    Name of physician (or Provider) Contacted: M. Lynch via text page  Changes to prescribed antibiotics required: None, already on Vancomycin   Tad Moore 11/20/2015  8:21 PM

## 2015-11-21 ENCOUNTER — Inpatient Hospital Stay (HOSPITAL_COMMUNITY): Payer: Medicare Other

## 2015-11-21 DIAGNOSIS — R7881 Bacteremia: Secondary | ICD-10-CM

## 2015-11-21 DIAGNOSIS — E43 Unspecified severe protein-calorie malnutrition: Secondary | ICD-10-CM

## 2015-11-21 DIAGNOSIS — A4102 Sepsis due to Methicillin resistant Staphylococcus aureus: Secondary | ICD-10-CM

## 2015-11-21 DIAGNOSIS — B9562 Methicillin resistant Staphylococcus aureus infection as the cause of diseases classified elsewhere: Secondary | ICD-10-CM

## 2015-11-21 LAB — BASIC METABOLIC PANEL
ANION GAP: 3 — AB (ref 5–15)
BUN: 6 mg/dL (ref 6–20)
CHLORIDE: 111 mmol/L (ref 101–111)
CO2: 24 mmol/L (ref 22–32)
Calcium: 8.3 mg/dL — ABNORMAL LOW (ref 8.9–10.3)
Creatinine, Ser: 0.39 mg/dL — ABNORMAL LOW (ref 0.61–1.24)
GFR calc Af Amer: >60 mL/min (ref >60)
GLUCOSE: 103 mg/dL — AB (ref 65–99)
POTASSIUM: 3 mmol/L — AB (ref 3.5–5.1)
Sodium: 138 mmol/L (ref 135–145)

## 2015-11-21 LAB — CBC
HEMATOCRIT: 24.8 % — AB (ref 39.0–52.0)
HEMATOCRIT: 24.1 % — AB (ref 39.0–52.0)
HEMOGLOBIN: 7.4 g/dL — AB (ref 13.0–17.0)
Hemoglobin: 7.3 g/dL — ABNORMAL LOW (ref 13.0–17.0)
MCH: 24.3 pg — ABNORMAL LOW (ref 26.0–34.0)
MCH: 25.5 pg — ABNORMAL LOW (ref 26.0–34.0)
MCHC: 29.4 g/dL — ABNORMAL LOW (ref 30.0–36.0)
MCHC: 30.7 g/dL (ref 30.0–36.0)
MCV: 82.4 fL (ref 78.0–100.0)
MCV: 83.1 fL (ref 78.0–100.0)
Platelets: 435 10*3/uL — ABNORMAL HIGH (ref 150–400)
Platelets: 445 10*3/uL — ABNORMAL HIGH (ref 150–400)
RBC: 3.01 MIL/uL — ABNORMAL LOW (ref 4.22–5.81)
RBC: 2.9 MIL/uL — ABNORMAL LOW (ref 4.22–5.81)
RDW: 15.5 % (ref 11.5–15.5)
RDW: 15.6 % — ABNORMAL HIGH (ref 11.5–15.5)
WBC: 15.4 10*3/uL — AB (ref 4.0–10.5)
WBC: 16.2 10*3/uL — AB (ref 4.0–10.5)

## 2015-11-21 LAB — URINE CULTURE

## 2015-11-21 MED ORDER — POTASSIUM CHLORIDE CRYS ER 20 MEQ PO TBCR
40.0000 meq | EXTENDED_RELEASE_TABLET | Freq: Once | ORAL | Status: AC
  Administered 2015-11-21: 40 meq via ORAL
  Filled 2015-11-21: qty 2
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
## 2015-11-21 NOTE — Progress Notes (Signed)
For transfer to [facility]. Report called to [place], [place]

## 2015-11-21 NOTE — Progress Notes (Signed)
Placed patient on CPAP for the night. RT will continue to monitor as needed.

## 2015-11-22 LAB — CBC
HCT: 23.4 % — ABNORMAL LOW (ref 39.0–52.0)
HEMOGLOBIN: 7 g/dL — AB (ref 13.0–17.0)
MCH: 24.7 pg — AB (ref 26.0–34.0)
MCHC: 29.9 g/dL — AB (ref 30.0–36.0)
MCV: 82.7 fL (ref 78.0–100.0)
PLATELETS: 471 10*3/uL — AB (ref 150–400)
RBC: 2.83 MIL/uL — AB (ref 4.22–5.81)
RDW: 15.5 % (ref 11.5–15.5)
WBC: 16.6 10*3/uL — ABNORMAL HIGH (ref 4.0–10.5)

## 2015-11-22 LAB — BASIC METABOLIC PANEL
Anion gap: 6 (ref 5–15)
BUN: 10 mg/dL (ref 6–20)
CHLORIDE: 111 mmol/L (ref 101–111)
CO2: 21 mmol/L — ABNORMAL LOW (ref 22–32)
CREATININE: 0.52 mg/dL — AB (ref 0.61–1.24)
Calcium: 8.2 mg/dL — ABNORMAL LOW (ref 8.9–10.3)
GFR calc Af Amer: >60 mL/min (ref >60)
GFR calc non Af Amer: >60 mL/min (ref >60)
GLUCOSE: 140 mg/dL — AB (ref 65–99)
POTASSIUM: 3 mmol/L — AB (ref 3.5–5.1)
SODIUM: 138 mmol/L (ref 135–145)

## 2015-11-22 LAB — PREPARE RBC (CROSSMATCH)

## 2015-11-22 MED ORDER — POTASSIUM CHLORIDE CRYS ER 20 MEQ PO TBCR
40.0000 meq | EXTENDED_RELEASE_TABLET | Freq: Once | ORAL | Status: DC
  Filled 2015-11-22: qty 2

## 2015-11-22 MED ORDER — SODIUM CHLORIDE 0.9 % IV SOLN: Freq: Once | INTRAVENOUS | Status: DC

## 2015-11-22 NOTE — Progress Notes (Signed)
Placed patient on CPAP for the night via auto-mode with minimum pressure set at 14 and maximum pressure set at 20cm. Oxygen set at 3lpm

## 2015-11-22 NOTE — Progress Notes (Signed)
Pharmacy Antibiotic Note  [name] [name] is a 50 y.o. male admitted on 11/19/2015 with sepsis.  Pharmacy has been consulted for vancomycin and Zosyn dosing. Pt is a quad so renal function is difficult to estimate, appears at baseline currently. Pt was recently on cefepime and flagyl for osteomyelitis.   VT ordered for this morning and lab attempted the draw a couple times without success. Pt is a difficult stick; phlebotomy reported not being able to send another person to attempt for another 45 min and the VT was already delayed 1.5 hrs. Will reschedule for tonight.  Plan: - Vancomycin 1250 mg IV Q12H - Zosyn 3.375gm IV Q8H (4 hr inf) - F/u renal fxn, C&S, clinical status and trough at SS  Height: 6' (182.9 cm) Weight: 178 lb 9.2 oz (81 kg) IBW/kg (Calculated) : 77.6  Temp (24hrs), Avg:99.3 F (37.4 C), Min:98 F (36.7 C), Max:100.4 F (38 C)   Recent Labs Lab 11/19/15 2048 11/19/15 2152 11/19/15 2155 11/19/15 2251 11/20/15 0536 11/20/15 1214 11/21/15 0825 11/21/15 1610  WBC 15.7*  --   --   --  12.9* 6.7 15.4* 16.2*  CREATININE 0.53*  --  0.50* 0.48* 0.38*  --  0.39*  --   LATICACIDVEN  --  1.45  --   --   --   --   --   --     Estimated Creatinine Clearance: 122.6 mL/min (by C-G formula based on Cr of 0.39).    Allergies  Allergen Reactions  . Ditropan [Oxybutynin] Other (See Comments)    hallucinations  . Feraheme [Ferumoxytol] Other (See Comments)    unknown    Antimicrobials this admission: Vanc 7/6>> Zosyn 7/6>>  Dose adjustments this admission: none  Microbiology results: 7/6 UCx (foley) >> mult species 7/6 BCx >> 1 of 2 with GPC BCID -> MRSE  7/6 RVP: + Rhinovirus/Enterovirus  7/7 MRSA PCR >>  7/8 BCx: collected   Thank you for allowing pharmacy to be a part of this patient's care.  Renold Genta, PharmD, BCPS Clinical Pharmacist Pager: [phone] 11/22/2015 12:18 PM
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
## 2015-11-23 DIAGNOSIS — G8250 Quadriplegia, unspecified: Secondary | ICD-10-CM

## 2015-11-23 LAB — CBC
HCT: 29.6 % — ABNORMAL LOW (ref 39.0–52.0)
Hemoglobin: 9 g/dL — ABNORMAL LOW (ref 13.0–17.0)
MCH: 24.7 pg — AB (ref 26.0–34.0)
MCHC: 30.4 g/dL (ref 30.0–36.0)
MCV: 81.3 fL (ref 78.0–100.0)
PLATELETS: 508 10*3/uL — AB (ref 150–400)
RBC: 3.64 MIL/uL — AB (ref 4.22–5.81)
RDW: 15.4 % (ref 11.5–15.5)
WBC: 17.3 10*3/uL — ABNORMAL HIGH (ref 4.0–10.5)

## 2015-11-23 LAB — MRSA CULTURE

## 2015-11-23 LAB — TYPE AND SCREEN
ABO/RH(D): B POS
Antibody Screen: NEGATIVE
Unit division: 0

## 2015-11-23 LAB — BASIC METABOLIC PANEL
Anion gap: 7 (ref 5–15)
BUN: 11 mg/dL (ref 6–20)
CO2: 22 mmol/L (ref 22–32)
CREATININE: 0.49 mg/dL — AB (ref 0.61–1.24)
Calcium: 8.9 mg/dL (ref 8.9–10.3)
Chloride: 111 mmol/L (ref 101–111)
GFR calc Af Amer: >60 mL/min (ref >60)
GLUCOSE: 91 mg/dL (ref 65–99)
POTASSIUM: 3.6 mmol/L (ref 3.5–5.1)
SODIUM: 140 mmol/L (ref 135–145)

## 2015-11-23 LAB — CULTURE, BLOOD (ROUTINE X 2)

## 2015-11-23 MED ORDER — FUROSEMIDE 20 MG PO TABS
20.0000 mg | ORAL_TABLET | Freq: Two times a day (BID) | ORAL | Status: DC
  Administered 2015-11-23 – 2015-11-27 (×8): 20 mg via ORAL
  Filled 2015-11-23 (×8): qty 1

## 2015-11-23 NOTE — Care Management Note (Signed)
Case Management Note  Patient Details  Name: [name] [name] MRN: [id number] Date of Birth: Dec 13, 1965  Subjective/Objective:      Sepsis secondary to chronic osteomyelitis of the pelvic region / leukocytosis / MRSA bacteremia               Action/Plan: Discharge Planning:   NCM spoke to pt at bedside. Lives at home with sister, [name] [name] # [phone]. Sister does his dressing changes at home. Offered choice for HH/provided [facility] list. Pt requested [facility] for [facility]. Pt reports having hospital bed, hoyer lift and wheelchair at home. Will notify [facility] on 11/24/2015 with new referral. Will need orders/F2F for [facility] RN.   PCP- [name] MD  Expected Discharge Date:                  Expected Discharge Plan:  [place]  In-House Referral:  NA  Discharge planning Services  CM Consult  Post Acute Care Choice:  Home Health Choice offered to:  Patient  DME Arranged:  N/A DME Agency:  NA  HH Arranged:  RN [place] Agency:  [facility] (now [facility])  Status of Service:  In process, will continue to follow  If discussed at Long Length of Stay Meetings, dates discussed:    Additional Comments:  Erenest Rasher, RN 11/23/2015, 5:26 PM
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
## 2015-11-24 ENCOUNTER — Inpatient Hospital Stay (HOSPITAL_COMMUNITY): Payer: Medicare Other

## 2015-11-24 LAB — BASIC METABOLIC PANEL
Anion gap: 4 — ABNORMAL LOW (ref 5–15)
BUN: 9 mg/dL (ref 6–20)
CO2: 24 mmol/L (ref 22–32)
Calcium: 8.4 mg/dL — ABNORMAL LOW (ref 8.9–10.3)
Chloride: 109 mmol/L (ref 101–111)
Creatinine, Ser: 0.47 mg/dL — ABNORMAL LOW (ref 0.61–1.24)
GFR calc Af Amer: >60 mL/min (ref >60)
Glucose, Bld: 105 mg/dL — ABNORMAL HIGH (ref 65–99)
POTASSIUM: 3 mmol/L — AB (ref 3.5–5.1)
SODIUM: 137 mmol/L (ref 135–145)

## 2015-11-24 LAB — CBC
HCT: 26.8 % — ABNORMAL LOW (ref 39.0–52.0)
Hemoglobin: 8.2 g/dL — ABNORMAL LOW (ref 13.0–17.0)
MCH: 24.8 pg — AB (ref 26.0–34.0)
MCHC: 30.6 g/dL (ref 30.0–36.0)
MCV: 81.2 fL (ref 78.0–100.0)
PLATELETS: 519 10*3/uL — AB (ref 150–400)
RBC: 3.3 MIL/uL — AB (ref 4.22–5.81)
RDW: 15.3 % (ref 11.5–15.5)
WBC: 13.9 10*3/uL — AB (ref 4.0–10.5)

## 2015-11-24 LAB — CULTURE, BLOOD (ROUTINE X 2): Culture: NO GROWTH

## 2015-11-24 MED ORDER — IPRATROPIUM-ALBUTEROL 0.5-2.5 (3) MG/3ML IN SOLN
3.0000 mL | RESPIRATORY_TRACT | Status: DC | PRN
  Administered 2015-11-26: 3 mL via RESPIRATORY_TRACT
  Filled 2015-11-24: qty 3

## 2015-11-24 MED ORDER — IPRATROPIUM-ALBUTEROL 0.5-2.5 (3) MG/3ML IN SOLN
3.0000 mL | Freq: Four times a day (QID) | RESPIRATORY_TRACT | Status: DC
  Administered 2015-11-24 – 2015-11-27 (×14): 3 mL via RESPIRATORY_TRACT
  Filled 2015-11-24 (×13): qty 3

## 2015-11-24 MED ORDER — WHITE PETROLATUM GEL
Status: AC
  Administered 2015-11-24: 0.2
  Filled 2015-11-24: qty 1
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
## 2015-11-24 NOTE — Progress Notes (Signed)
Quad cough assist performed with patient. Received small amount of [name] [name] sputum. Patient tolerated well. No complications. Sister called about concern with quad cough and breathing tx. Per MD Charlies Silvers notes on 11/24/2015 at 0927, ordered DuoNeb every 6 hours scheduled and every 4 hours as needed for SOB or wheezing.

## 2015-11-24 NOTE — Evaluation (Signed)
Physical Therapy Evaluation Patient Details Name: [name] [name] MRN: [id number] DOB: [birth year] Today's Date: 11/24/2015   History of Present Illness  pt is a 50 y/o male with long h/o being a quad at C5 level with chronic sacral decubitus/osteo, admitted with 5 days of cough decreased appetite, malaise and worsening pressure wounds.  Clinical Impression  Pt admitted with/for above problems over 5 day period.  Pt currently limited functionally due to the problems listed below.  (see problems list.)  Pt will benefit from PT to maximize function and safety to be able to get home safely with available assist of sister.     Follow Up Recommendations Home health PT (wound care vs hhpt home safety and treat/educate)    Equipment Recommendations  None recommended by PT    Recommendations for Other Services       Precautions / Restrictions Precautions Precautions: Other (comment) (pressure relief)      Mobility  Bed Mobility Overal bed mobility: Needs Assistance             General bed mobility comments: rolls better L due to contractures.  best by draw sheet due to R UE does not hook caregivers arm adequately.  Transfers                 General transfer comment: By lift  Ambulation/Gait                Stairs            Wheelchair Mobility    Modified Rankin (Stroke Patients Only)       Balance                                             Pertinent Vitals/Pain Pain Assessment: Faces Faces Pain Scale: No hurt    Home Living Family/patient expects to be discharged to:: Private residence Living Arrangements: Other relatives (sister) Available Help at Discharge: Family;Available 24 hours/day Type of Home: House Home Access: Ramped entrance     Home Layout: One level Home Equipment: Hospital bed;Wheelchair - power;Adaptive equipment (hoyer/pads)      Prior Function Level of Independence: Needs assistance      ADL's /  Homemaking Assistance Needed: dependent on sister        Hand Dominance        Extremity/Trunk Assessment   Upper Extremity Assessment: RUE deficits/detail;LUE deficits/detail RUE Deficits / Details: generally weak, unable to utilize hook method to reposition and roll     LUE Deficits / Details: significantly stronger flexion and functional to use stylus in splint from computer, comm and scratching head etc.   Lower Extremity Assessment: RLE deficits/detail;LLE deficits/detail RLE Deficits / Details: no voluntary/spontaneous movement, contractured in ER LLE Deficits / Details: no voluntary/spontaneous movement, contractured in IR  Cervical / Trunk Assessment: Kyphotic  Communication   Communication: No difficulties  Cognition Arousal/Alertness: Awake/alert Behavior During Therapy: WFL for tasks assessed/performed Overall Cognitive Status: Within Functional Limits for tasks assessed                      General Comments General comments (skin integrity, edema, etc.): Did not mobilize today.  Pt does not move without a hoyer lift, so acute PT's intervention will entail getting tilt in space chair and cushion and assisting pt and staff in getting pt OOB for short periods.  Exercises        Assessment/Plan    PT Assessment Patient needs continued PT services (limited intervention)  PT Diagnosis Quadraplegia   PT Problem List Decreased mobility;Impaired tone;Impaired sensation  PT Treatment Interventions Other (comment);Functional mobility training (transfers to w/c)   PT Goals (Current goals can be found in the Care Plan section) Acute Rehab PT Goals Patient Stated Goal: they just want me to get up. PT Goal Formulation: With patient Time For Goal Achievement: 12/01/15 Potential to Achieve Goals: Fair    Frequency Min 2X/week   Barriers to discharge        Co-evaluation               End of Session     Patient left: in bed;with call bell/phone  within reach Nurse Communication: Need for lift equipment;Precautions         Time: 1650-1716 PT Time Calculation (min) (ACUTE ONLY): 26 min   Charges:   PT Evaluation $PT Eval Moderate Complexity: 1 Procedure PT Treatments $Self Care/Home Management: 8-22   PT G Codes:        Flem Enderle, Tessie Fass 11/24/2015, 5:40 PM  11/24/2015  Donnella Sham, [place] [phone]  (pager)

## 2015-11-24 NOTE — Progress Notes (Signed)
Rt help pt with quad cough.

## 2015-11-24 NOTE — Care Management (Signed)
NCM spoke to [name], pt's sister. States pt does have a hospital bed with mattress overlay. His PCP sent over a Rx for Air Mattress with single cell to [facility]. Sister states their electrical system would not acomodate a more advanced bed due to the home being an older home. Contacted [facility] and they have alternating pressure mattress and is ready for delivery. Contacted [facility] with new referral. If pt dc home on IV abx, [facility] will not be able to service referral. [facility] will accept referral if pt does not dc on IV abx. Waiting final recommendations for home.  [facility] [address] [place], [place] Voice: [phone] Fax: [phone] [email or web address]

## 2015-11-25 ENCOUNTER — Telehealth: Payer: Self-pay | Admitting: *Deleted

## 2015-11-25 DIAGNOSIS — L89303 Pressure ulcer of unspecified buttock, stage 3: Secondary | ICD-10-CM

## 2015-11-25 DIAGNOSIS — A419 Sepsis, unspecified organism: Principal | ICD-10-CM

## 2015-11-25 DIAGNOSIS — D638 Anemia in other chronic diseases classified elsewhere: Secondary | ICD-10-CM

## 2015-11-25 DIAGNOSIS — M86659 Other chronic osteomyelitis, unspecified thigh: Secondary | ICD-10-CM

## 2015-11-25 LAB — CBC
HCT: 25.9 % — ABNORMAL LOW (ref 39.0–52.0)
HEMOGLOBIN: 8.2 g/dL — AB (ref 13.0–17.0)
MCH: 25.3 pg — AB (ref 26.0–34.0)
MCHC: 31.7 g/dL (ref 30.0–36.0)
MCV: 79.9 fL (ref 78.0–100.0)
Platelets: 517 10*3/uL — ABNORMAL HIGH (ref 150–400)
RBC: 3.24 MIL/uL — AB (ref 4.22–5.81)
RDW: 15.4 % (ref 11.5–15.5)
WBC: 13.3 10*3/uL — ABNORMAL HIGH (ref 4.0–10.5)

## 2015-11-25 LAB — BASIC METABOLIC PANEL
Anion gap: 8 (ref 5–15)
BUN: 9 mg/dL (ref 6–20)
CHLORIDE: 108 mmol/L (ref 101–111)
CO2: 25 mmol/L (ref 22–32)
CREATININE: 0.45 mg/dL — AB (ref 0.61–1.24)
Calcium: 8.4 mg/dL — ABNORMAL LOW (ref 8.9–10.3)
GFR calc Af Amer: >60 mL/min (ref >60)
GFR calc non Af Amer: >60 mL/min (ref >60)
GLUCOSE: 85 mg/dL (ref 65–99)
POTASSIUM: 2.7 mmol/L — AB (ref 3.5–5.1)
SODIUM: 141 mmol/L (ref 135–145)

## 2015-11-25 MED ORDER — POTASSIUM CHLORIDE CRYS ER 20 MEQ PO TBCR
40.0000 meq | EXTENDED_RELEASE_TABLET | ORAL | Status: AC
  Administered 2015-11-25 (×2): 40 meq via ORAL
  Filled 2015-11-25 (×2): qty 2

## 2015-11-25 NOTE — Progress Notes (Signed)
Patient received breathing treatment. BBS sounded clear; diminished. No Rhonchi noted at this time. Patient stated he does not need Quad cough at this time. RT will continue to monitor. Patient will go on CPAP around midnight.

## 2015-11-25 NOTE — Telephone Encounter (Signed)
Myrtle,   Please call pt next week and reschedule his appointment after his hospital discharge, thanks  Truitt Merle  11/25/2015

## 2015-11-25 NOTE — Progress Notes (Signed)
Quad cough completed as per pt. Request.

## 2015-11-25 NOTE — Progress Notes (Signed)
CRITICAL VALUE ALERT  Critical value received:  Potassium 2.7  Date of notification:  11/25/15  Time of notification:  0850  Critical value read back:Yes.    Nurse who received alert:  Betha Loa  MD notified (1st page):  Dr Fanny Bien  Time of first page:  [phone]  MD notified (2nd page):  Time of second page:  Responding MD:  Dr Fanny Bien  Time MD responded:  [phone]
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
## 2015-11-25 NOTE — Telephone Encounter (Signed)
"  I need to cancel my appointment on 11-27-2015 because I am in the Hospital."  Wished he get well soon.

## 2015-11-26 ENCOUNTER — Encounter (HOSPITAL_BASED_OUTPATIENT_CLINIC_OR_DEPARTMENT_OTHER): Payer: Medicare Other | Attending: Internal Medicine

## 2015-11-26 DIAGNOSIS — K3184 Gastroparesis: Secondary | ICD-10-CM

## 2015-11-26 LAB — CBC
HEMATOCRIT: 26.8 % — AB (ref 39.0–52.0)
HEMOGLOBIN: 8.1 g/dL — AB (ref 13.0–17.0)
MCH: 24.4 pg — AB (ref 26.0–34.0)
MCHC: 30.2 g/dL (ref 30.0–36.0)
MCV: 80.7 fL (ref 78.0–100.0)
Platelets: 580 10*3/uL — ABNORMAL HIGH (ref 150–400)
RBC: 3.32 MIL/uL — ABNORMAL LOW (ref 4.22–5.81)
RDW: 15.8 % — AB (ref 11.5–15.5)
WBC: 12.9 10*3/uL — ABNORMAL HIGH (ref 4.0–10.5)

## 2015-11-26 LAB — BASIC METABOLIC PANEL
ANION GAP: 7 (ref 5–15)
BUN: 15 mg/dL (ref 6–20)
CHLORIDE: 104 mmol/L (ref 101–111)
CO2: 25 mmol/L (ref 22–32)
Calcium: 8.3 mg/dL — ABNORMAL LOW (ref 8.9–10.3)
Creatinine, Ser: 0.47 mg/dL — ABNORMAL LOW (ref 0.61–1.24)
GFR calc Af Amer: >60 mL/min (ref >60)
GLUCOSE: 122 mg/dL — AB (ref 65–99)
POTASSIUM: 3.3 mmol/L — AB (ref 3.5–5.1)
Sodium: 136 mmol/L (ref 135–145)

## 2015-11-26 LAB — CULTURE, BLOOD (ROUTINE X 2): CULTURE: NO GROWTH

## 2015-11-26 MED ORDER — POTASSIUM CHLORIDE CRYS ER 20 MEQ PO TBCR
40.0000 meq | EXTENDED_RELEASE_TABLET | ORAL | Status: AC
  Administered 2015-11-26 (×2): 40 meq via ORAL
  Filled 2015-11-26: qty 2

## 2015-11-26 MED ORDER — AMOXICILLIN-POT CLAVULANATE 875-125 MG PO TABS
1.0000 | ORAL_TABLET | Freq: Two times a day (BID) | ORAL | Status: DC
Start: 2015-11-26 — End: 2015-11-27
  Administered 2015-11-26 – 2015-11-27 (×3): 1 via ORAL
  Filled 2015-11-26 (×3): qty 1

## 2015-11-26 NOTE — Progress Notes (Signed)
RN stated she attempted Quad cough and pt was not able to cough up anything. RT attempted Quad cough with patient and he was not able to cough up any sputum. Breathing treatment was given to see if it was help. Attempted again after treatment and patient was not able to cough up any sputum. Patient said we could try again at a later time. RT will continue to monitor.
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
## 2015-11-26 NOTE — Progress Notes (Signed)
RT attempted quad cough without success

## 2015-11-26 NOTE — Clinical Social Work Note (Signed)
Clinical Social Work Assessment  Patient Details  Name: [name] [name] MRN: [id number] Date of Birth: September 12, 1965  Date of referral:  11/26/15               Reason for consult:  Facility Placement                Permission sought to share information with:  Family Supports Permission granted to share information::  Yes, Verbal Permission Granted  Name::     susan  Agency::  SNFs  Relationship::  sister  Contact Information:     Housing/Transportation Living arrangements for the past 2 months:  Single Family Home Source of Information:  Patient Patient Interpreter Needed:  None Criminal Activity/Legal Involvement Pertinent to Current Situation/Hospitalization:  No - Comment as needed Significant Relationships:  Siblings Lives with:  Siblings Do you feel safe going back to the place where you live?  Yes Need for family participation in patient care:  Yes (Comment) (caregiving)  Care giving concerns:  Pt lives at home with sister who is caregiver- was getting home health but that was discontinuing- too much care for pt sister to do safely at this time.   Social Worker assessment / plan:  CSW spoke with pt concerning MD recommendation for SNF stay to help improve pt wounds.  Pt has been to SNF in the past and had a good experience with [facility].  Employment status:  Disabled (Comment on whether or not currently receiving Disability) Insurance information:  Programmer, applications PT Recommendations:  Home with [place] / Referral to community resources:  [place]  Patient/Family's Response to care:  Pt is agreeable to going to SNF for SHORT term rehab- pt is very adamant that he not stay in rehab long term  Patient/Family's Understanding of and Emotional Response to Diagnosis, Current Treatment, and Prognosis:  Pt has good understanding of his chronic condition and it treatment course- just hopeful that rehab will be a short stay- very fearful of being left in a  facility  Emotional Assessment Appearance:  Appears stated age Attitude/Demeanor/Rapport:    Affect (typically observed):  Appropriate Orientation:  Oriented to Situation, Oriented to  Time, Oriented to Place, Oriented to Self Alcohol / Substance use:  Not Applicable Psych involvement (Current and /or in the community):  No (Comment)  Discharge Needs  Concerns to be addressed:  Care Coordination Readmission within the last 30 days:  No Current discharge risk:  Physical Impairment, Lack of support system Barriers to Discharge:  Continued Medical Work up   Cranford Mon, [place] 11/26/2015, 4:33 PM

## 2015-11-26 NOTE — Progress Notes (Signed)
Patient did not feel need for Quad assist at this time.

## 2015-11-26 NOTE — Progress Notes (Signed)
Suprapubic catheter leaking around site. Dressing at site changed. Dr Broadus John notified. Dr Broadus John said she will page Urology. Night nurse made aware to try to keep dry as possible tonight.
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
## 2015-11-26 NOTE — Progress Notes (Addendum)
Pt agreeable to SNF stay at [place] can accept  [facility] auth submitted for DC tomorrow- auth pending  CSW will continue to follow  Domenica Reamer, [place] Social Worker [phone]

## 2015-11-26 NOTE — Care Management Note (Signed)
Case Management Note  Patient Details  Name: TIYON ROORDA MRN: [id number] Date of Birth: January 19, 1966  Subjective/Objective:                  Spoke with patient at the bedside. Received new consult from Dr Fanny Bien. Verified with patient that half-sister [name] would not be available to care for patient until Monday, patient would be medically ready prior to that time. Patient pleasant and verbalized understanding of situation at home and that hospital could not let him stay beyond the point in time that he required hospitalized care. Patient stated that he would like to look into [facility] as a possibility. Patient likely to require hydrotherapy at SNF, patient understood this limitied the possibilities of places to choose from. CSW updated and asked to speak with patient.    Action/Plan:   Expected Discharge Date:                  Expected Discharge Plan:  [place]  In-House Referral:  NA  Discharge planning Services  CM Consult  Post Acute Care Choice:  Home Health Choice offered to:  Patient  DME Arranged:  N/A DME Agency:  NA  HH Arranged:  RN [place] Agency:  [facility] (now [facility])  Status of Service:  Completed, signed off  If discussed at [employer] of Stay Meetings, dates discussed:    Additional Comments:  Carles Collet, RN 11/26/2015, 2:40 PM
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
## 2015-11-26 NOTE — Progress Notes (Signed)
Physical Therapy Wound Treatment Patient Details  Name: [name] [name] MRN: [id number] Date of Birth: 08/12/1965  Today's Date: 11/26/2015 Time: 0943-1033 Time Calculation (min): 50 min  Subjective  Subjective: Pt pleasant and agreeable to therapy Patient and Family Stated Goals: Heal wound, return home Date of Onset:  (Chronic) Prior Treatments: HH nurse was coming and stopped ~3 weeks ago. Sister has been doing dressing changes at home since then.   Pain Score:  Pt did not report pain throughout session.  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 (Active)  Dressing Type ABD;Barrier Film (skin prep);Gauze (Comment);Moist to dry 11/26/2015 10:59 AM  Dressing Changed 11/26/2015 10:59 AM  Dressing Change Frequency Daily 11/26/2015 10:59 AM  State of Healing Eschar 11/26/2015 10:59 AM  Site / Wound Assessment Red;Pink;Black;Yellow 11/26/2015 10:59 AM  % Wound base Red or Granulating 90% 11/26/2015 10:59 AM  % Wound base Yellow 5% 11/26/2015 10:59 AM  % Wound base Black 5% 11/26/2015 10:59 AM  % Wound base Other (Comment) 0% 11/26/2015 10:59 AM  Peri-wound Assessment Intact 11/26/2015 10:59 AM  Wound Length (cm) 18.7 cm 11/26/2015  9:58 AM  Wound Width (cm) 30 cm 11/26/2015  9:58 AM  Wound Depth (cm) 1 cm 11/26/2015  9:58 AM  Tunneling (cm) 6.6 11/26/2015  9:58 AM  Undermining (cm) 1.2 cm from 12-1:00 11/26/2015  9:58 AM  Margins Unattached edges (unapproximated) 11/26/2015 10:59 AM  Drainage Amount Moderate 11/26/2015 10:59 AM  Drainage Description Purulent;Odor 11/26/2015 10:59 AM  Treatment Debridement (Selective);Hydrotherapy (Pulse lavage);Packing (Saline gauze) 11/26/2015 10:59 AM  Santyl applied to wound bed prior to applying dressing.   Hydrotherapy Pulsed lavage therapy - wound location: Sacrum Pulsed Lavage with  Suction (psi): 12 psi Pulsed Lavage with Suction - Normal Saline Used: 1000 mL Pulsed Lavage Tip: Tip with splash shield Selective Debridement Selective Debridement - Location: Sacrum Selective Debridement - Tools Used: Forceps;Scissors Selective Debridement - Tissue Removed: Yellow and black necrotic tissue   Wound Assessment and Plan  Wound Therapy - Assess/Plan/Recommendations Wound Therapy - Clinical Statement: Pt presents with chronic sacral wound mostly granulated with a small area of necrotic tissue. Pt will benefit from continued hydrotherapy for selective removal of necrotic tissue, and to promote wound bed healing.  Wound Therapy - Functional Problem List: Decreased tolerance of OOB/sitting Factors Delaying/Impairing Wound Healing: Altered sensation;Incontinence;Immobility;Multiple medical problems Hydrotherapy Plan: Debridement;Dressing change;Patient/family education;Pulsatile lavage with suction Wound Therapy - Frequency: 6X / week Wound Therapy - Follow Up Recommendations: Home health RN Wound Plan: See above  Wound Therapy Goals- Improve the function of patient's integumentary system by progressing the wound(s) through the phases of wound healing (inflammation - proliferation - remodeling) by: Decrease Necrotic Tissue to: 0% Decrease Necrotic Tissue - Progress: Goal set today Increase Granulation Tissue to: 100% Increase Granulation Tissue - Progress: Goal set today Goals/treatment plan/discharge plan were made with and agreed upon by patient/family: Yes Time For Goal Achievement: 7 days Wound Therapy - Potential for Goals: Good  Goals will be updated until maximal potential achieved or discharge criteria met.  Discharge criteria: when goals achieved, discharge from hospital, MD decision/surgical intervention, no progress towards goals, refusal/missing three consecutive treatments without notification or medical reason.  GP     ,  11/26/2015, 11:52 AM     , PT, DPT Acute Rehabilitation Services Pager:   [phone]     

## 2015-11-27 ENCOUNTER — Ambulatory Visit: Payer: Medicare Other

## 2015-11-27 ENCOUNTER — Other Ambulatory Visit: Payer: Medicare Other

## 2015-11-27 DIAGNOSIS — Z7409 Other reduced mobility: Secondary | ICD-10-CM | POA: Diagnosis not present

## 2015-11-27 DIAGNOSIS — G8253 Quadriplegia, C5-C7 complete: Secondary | ICD-10-CM | POA: Diagnosis not present

## 2015-11-27 DIAGNOSIS — E46 Unspecified protein-calorie malnutrition: Secondary | ICD-10-CM | POA: Diagnosis not present

## 2015-11-27 DIAGNOSIS — Z433 Encounter for attention to colostomy: Secondary | ICD-10-CM | POA: Diagnosis not present

## 2015-11-27 DIAGNOSIS — L89133 Pressure ulcer of right lower back, stage 3: Secondary | ICD-10-CM | POA: Diagnosis not present

## 2015-11-27 DIAGNOSIS — D509 Iron deficiency anemia, unspecified: Secondary | ICD-10-CM | POA: Diagnosis not present

## 2015-11-27 DIAGNOSIS — Z933 Colostomy status: Secondary | ICD-10-CM | POA: Diagnosis not present

## 2015-11-27 DIAGNOSIS — G4733 Obstructive sleep apnea (adult) (pediatric): Secondary | ICD-10-CM | POA: Diagnosis not present

## 2015-11-27 DIAGNOSIS — D649 Anemia, unspecified: Secondary | ICD-10-CM | POA: Diagnosis not present

## 2015-11-27 DIAGNOSIS — A419 Sepsis, unspecified organism: Secondary | ICD-10-CM | POA: Diagnosis not present

## 2015-11-27 DIAGNOSIS — N318 Other neuromuscular dysfunction of bladder: Secondary | ICD-10-CM | POA: Diagnosis not present

## 2015-11-27 DIAGNOSIS — L89303 Pressure ulcer of unspecified buttock, stage 3: Secondary | ICD-10-CM | POA: Diagnosis not present

## 2015-11-27 DIAGNOSIS — G4730 Sleep apnea, unspecified: Secondary | ICD-10-CM | POA: Diagnosis not present

## 2015-11-27 DIAGNOSIS — K3184 Gastroparesis: Secondary | ICD-10-CM | POA: Diagnosis not present

## 2015-11-27 DIAGNOSIS — L89309 Pressure ulcer of unspecified buttock, unspecified stage: Secondary | ICD-10-CM | POA: Diagnosis not present

## 2015-11-27 DIAGNOSIS — L98499 Non-pressure chronic ulcer of skin of other sites with unspecified severity: Secondary | ICD-10-CM | POA: Diagnosis not present

## 2015-11-27 DIAGNOSIS — L89890 Pressure ulcer of other site, unstageable: Secondary | ICD-10-CM | POA: Diagnosis not present

## 2015-11-27 DIAGNOSIS — M86659 Other chronic osteomyelitis, unspecified thigh: Secondary | ICD-10-CM | POA: Diagnosis not present

## 2015-11-27 DIAGNOSIS — J189 Pneumonia, unspecified organism: Secondary | ICD-10-CM

## 2015-11-27 DIAGNOSIS — L89154 Pressure ulcer of sacral region, stage 4: Secondary | ICD-10-CM | POA: Diagnosis not present

## 2015-11-27 DIAGNOSIS — L8944 Pressure ulcer of contiguous site of back, buttock and hip, stage 4: Secondary | ICD-10-CM | POA: Diagnosis not present

## 2015-11-27 DIAGNOSIS — M8668 Other chronic osteomyelitis, other site: Secondary | ICD-10-CM | POA: Diagnosis not present

## 2015-11-27 DIAGNOSIS — S31102A Unspecified open wound of abdominal wall, epigastric region without penetration into peritoneal cavity, initial encounter: Secondary | ICD-10-CM | POA: Diagnosis not present

## 2015-11-27 DIAGNOSIS — S31000D Unspecified open wound of lower back and pelvis without penetration into retroperitoneum, subsequent encounter: Secondary | ICD-10-CM | POA: Diagnosis not present

## 2015-11-27 DIAGNOSIS — R6520 Severe sepsis without septic shock: Secondary | ICD-10-CM | POA: Diagnosis not present

## 2015-11-27 DIAGNOSIS — L8992 Pressure ulcer of unspecified site, stage 2: Secondary | ICD-10-CM | POA: Diagnosis not present

## 2015-11-27 DIAGNOSIS — S31809A Unspecified open wound of unspecified buttock, initial encounter: Secondary | ICD-10-CM | POA: Diagnosis not present

## 2015-11-27 DIAGNOSIS — L89134 Pressure ulcer of right lower back, stage 4: Secondary | ICD-10-CM | POA: Diagnosis not present

## 2015-11-27 DIAGNOSIS — D638 Anemia in other chronic diseases classified elsewhere: Secondary | ICD-10-CM | POA: Diagnosis not present

## 2015-11-27 DIAGNOSIS — L98412 Non-pressure chronic ulcer of buttock with fat layer exposed: Secondary | ICD-10-CM | POA: Diagnosis not present

## 2015-11-27 DIAGNOSIS — L98413 Non-pressure chronic ulcer of buttock with necrosis of muscle: Secondary | ICD-10-CM | POA: Diagnosis not present

## 2015-11-27 DIAGNOSIS — L98419 Non-pressure chronic ulcer of buttock with unspecified severity: Secondary | ICD-10-CM | POA: Diagnosis not present

## 2015-11-27 DIAGNOSIS — M869 Osteomyelitis, unspecified: Secondary | ICD-10-CM | POA: Diagnosis not present

## 2015-11-27 DIAGNOSIS — E43 Unspecified severe protein-calorie malnutrition: Secondary | ICD-10-CM | POA: Diagnosis not present

## 2015-11-27 DIAGNOSIS — M6281 Muscle weakness (generalized): Secondary | ICD-10-CM | POA: Diagnosis not present

## 2015-11-27 MED ORDER — AMOXICILLIN-POT CLAVULANATE 875-125 MG PO TABS: 1.0000 | ORAL_TABLET | Freq: Two times a day (BID) | ORAL | Status: DC

## 2015-11-27 MED ORDER — POTASSIUM CHLORIDE CRYS ER 20 MEQ PO TBCR: 20.0000 meq | EXTENDED_RELEASE_TABLET | Freq: Two times a day (BID) | ORAL | Status: DC

## 2015-11-27 MED ORDER — METOCLOPRAMIDE HCL 5 MG PO TABS: 5.0000 mg | ORAL_TABLET | Freq: Three times a day (TID) | ORAL | Status: DC

## 2015-11-27 NOTE — Discharge Summary (Addendum)
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 25.9* 26.8*  MCV 82.7 81.3 81.2 79.9 80.7  PLT 471* 508* 519* 517* 580*   Cardiac Enzymes: No results for input(s): CKTOTAL, CKMB, CKMBINDEX, TROPONINI in the last 168 hours. BNP: BNP (last 3 results) No results for input(s): BNP in the last 8760 hours.  ProBNP (last 3 results) No results for input(s): PROBNP in the last 8760 hours.  CBG: No results for input(s): GLUCAP in the last 168 hours.     SignedDomenic Polite MD.  [facility] 11/27/2015, 1:34 PM

## 2015-11-27 NOTE — Care Management Important Message (Addendum)
Important Message  Patient Details  Name: [name] [name] MRN: [id number] Date of Birth: 1966/05/10   Medicare Important Message Given:  Yes    Carles Collet, RN 11/27/2015, 8:31 AM Important Message  Patient Details  Name: [name] [name] MRN: [id number] Date of Birth: Aug 06, 1965   Medicare Important Message Given:  Yes    Carles Collet, RN 11/27/2015, 8:31 AM

## 2015-11-27 NOTE — Progress Notes (Signed)
Pt. placed on CPAP for h/s, tolerating well on room air.

## 2015-11-27 NOTE — Progress Notes (Addendum)
Patient will discharge to [facility] Anticipated discharge date: 7/14 Family notified: pt to notify sister Transportation by [facility]- scheduled for 3:30pm  CSW signing off.  Domenica Reamer, [place] Social Worker [phone]

## 2015-11-27 NOTE — Progress Notes (Signed)
Performed "Quad Cough", on pt.., tolerated well, productive cough-swallowed, no complications.

## 2015-11-27 NOTE — Clinical Social Work Placement (Signed)
   CLINICAL SOCIAL WORK PLACEMENT  NOTE  Date:  11/27/2015  Patient Details  Name: [name] [name] MRN: [id number] Date of Birth: [birth year]  Clinical Social Work is seeking post-discharge placement for this patient at the [place] level of care (*CSW will initial, date and re-position this form in  chart as items are completed):  Yes   Patient/family provided with [facility] Work Department's list of facilities offering this level of care within the geographic area requested by the patient (or if unable, by the patient's family).  Yes   Patient/family informed of their freedom to choose among providers that offer the needed level of care, that participate in Medicare, Medicaid or managed care program needed by the patient, have an available bed and are willing to accept the patient.  Yes   Patient/family informed of [facility]'s ownership interest in [facility] and [facility], as well as of the fact that they are under no obligation to receive care at these facilities.  PASRR submitted to EDS on       PASRR number received on       Existing PASRR number confirmed on 11/26/15     FL2 transmitted to all facilities in geographic area requested by pt/family on 11/26/15     FL2 transmitted to all facilities within larger geographic area on       Patient informed that his/her managed care company has contracts with or will negotiate with certain facilities, including the following:            Patient/family informed of bed offers received.  Patient chooses bed at [facility]     Physician recommends and patient chooses bed at      Patient to be transferred to [facility] on 11/27/15.  Patient to be transferred to facility by ptar     Patient family notified on 11/27/15 of transfer.  Name of family member notified:  pt to notify sister     PHYSICIAN Please sign FL2     Additional Comment:     _______________________________________________ Cranford Mon, LCSW 11/27/2015, 2:11 PM

## 2015-11-27 NOTE — Progress Notes (Signed)
Physical Therapy Wound Treatment Patient Details  Name: [name] [name] MRN: [id number] Date of Birth: Dec 30, 1965  Today's Date: 11/27/2015 Time: 1030-1106 Time Calculation (min): 36 min  Subjective  Subjective: Pt pleasant and agreeable to therapy Patient and Family Stated Goals: Heal wound, return home Date of Onset:  (Chronic) Prior Treatments: [facility] nurse was coming and stopped ~3 weeks ago. Sister has been doing dressing changes at home since then.   Pain Score:  Pt does not report any pain throughout session.   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 (Active)  Dressing Type ABD;Barrier Film (skin prep);Gauze (Comment);Moist to dry 11/27/2015 11:32 AM  Dressing Changed 11/27/2015 11:32 AM  Dressing Change Frequency Daily 11/27/2015 11:32 AM  State of Healing Eschar 11/27/2015 11:32 AM  Site / Wound Assessment Red;Pink;Black;Yellow 11/27/2015 11:32 AM  % Wound base Red or Granulating 90% 11/27/2015 11:32 AM  % Wound base Yellow 10% 11/27/2015 11:32 AM  % Wound base Black 0% 11/27/2015 11:32 AM  % Wound base Other (Comment) 0% 11/27/2015 11:32 AM  Peri-wound Assessment Intact 11/27/2015 11:32 AM  Wound Length (cm) 18.7 cm 11/26/2015  9:58 AM  Wound Width (cm) 30 cm 11/26/2015  9:58 AM  Wound Depth (cm) 1 cm 11/26/2015  9:58 AM  Tunneling (cm) 6.6 11/26/2015  9:58 AM  Undermining (cm) 1.2 cm from 12-1:00 11/26/2015  9:58 AM  Margins Unattached edges (unapproximated) 11/27/2015 11:32 AM  Drainage Amount Moderate 11/27/2015 11:32 AM  Drainage Description Purulent;Odor 11/27/2015 11:32 AM  Treatment Debridement (Selective);Hydrotherapy (Pulse lavage);Packing (Saline gauze) 11/27/2015 11:32 AM  Santyl applied to wound bed prior to applying dressing.   Hydrotherapy Pulsed lavage therapy - wound location: Sacrum Pulsed  Lavage with Suction (psi): 12 psi Pulsed Lavage with Suction - Normal Saline Used: 1000 mL Pulsed Lavage Tip: Tip with splash shield Selective Debridement Selective Debridement - Location: Sacrum Selective Debridement - Tools Used: Forceps;Scissors Selective Debridement - Tissue Removed: Yellow necrotic tissue   Wound Assessment and Plan  Wound Therapy - Assess/Plan/Recommendations Wound Therapy - Clinical Statement: Pt will benefit from continued hydrotherapy for selective removal of necrotic tissue, and to promote wound bed healing.  Wound Therapy - Functional Problem List: Decreased tolerance of OOB/sitting Factors Delaying/Impairing Wound Healing: Altered sensation;Incontinence;Immobility;Multiple medical problems Hydrotherapy Plan: Debridement;Dressing change;Patient/family education;Pulsatile lavage with suction Wound Therapy - Frequency: 6X / week Wound Therapy - Follow Up Recommendations: Home health RN Wound Plan: See above  Wound Therapy Goals- Improve the function of patient's integumentary system by progressing the wound(s) through the phases of wound healing (inflammation - proliferation - remodeling) by: Decrease Necrotic Tissue to: 0% Decrease Necrotic Tissue - Progress: Progressing toward goal Increase Granulation Tissue to: 100% Increase Granulation Tissue - Progress: Progressing toward goal Goals/treatment plan/discharge plan were made with and agreed upon by patient/family: Yes Time For Goal Achievement: 7 days Wound Therapy - Potential for Goals: Good  Goals will be updated until maximal potential achieved or discharge criteria met.  Discharge criteria: when goals achieved, discharge from hospital, MD decision/surgical intervention, no progress towards goals, refusal/missing three consecutive treatments without notification or medical reason.  GP     Rolinda Roan 11/27/2015, 11:36 AM   Rolinda Roan, PT, DPT Acute Rehabilitation Services Pager:  [phone]

## 2015-11-27 NOTE — Progress Notes (Signed)
Nsg Discharge Note  Admit Date:  11/19/2015 Discharge date: 11/27/2015   [name] to be D/C'd Skilled nursing facility per MD order.  AVS completed.  Copy for chart, and copy for patient signed, and dated. Patient/caregiver able to verbalize understanding.  Discharge Medication:   Medication List    TAKE these medications        acetaminophen 325 MG tablet  Commonly known as:  TYLENOL  Take 2 tablets (650 mg total) by mouth every 6 (six) hours as needed for mild pain (or Fever >/= 101).     amoxicillin-clavulanate 875-125 MG tablet  Commonly known as:  AUGMENTIN  Take 1 tablet by mouth every 12 (twelve) hours. For 2days     baclofen 20 MG tablet  Commonly known as:  LIORESAL  Take 20 mg by mouth 4 (four) times daily.     diclofenac sodium 1 % Gel  Commonly known as:  VOLTAREN  Apply 2 g topically 4 (four) times daily. Apply to right lateral deltoid and left anterior shoulder.     docusate sodium 100 MG capsule  Commonly known as:  COLACE  Take 2 capsules (200 mg total) by mouth 2 (two) times daily.     ferrous sulfate 325 (65 FE) MG EC tablet  Take 1 tablet (325 mg total) by mouth 3 (three) times daily with meals.     furosemide 20 MG tablet  Commonly known as:  LASIX  Take 1 tablet (20 mg total) by mouth 2 (two) times daily as needed for fluid or edema. Take with Klor-Con     lidocaine 5 %  Commonly known as:  LIDODERM  Place 1 patch onto the skin daily. Remove & Discard patch within 12 hours or as directed by MD     magnesium oxide 400 (241.3 Mg) MG tablet  Commonly known as:  MAG-OX  Take 1 tablet (400 mg total) by mouth daily.     metoCLOPramide 5 MG tablet  Commonly known as:  REGLAN  Take 1 tablet (5 mg total) by mouth 4 (four) times daily -  before meals and at bedtime.     multivitamin with minerals Tabs tablet  Take 1 tablet by mouth every morning.     lactose free nutrition Liqd  Take 237 mLs by mouth 3 (three) times daily between meals.      nutrition supplement (JUVEN) Pack  Take 1 packet by mouth 2 (two) times daily between meals.     ondansetron 4 MG disintegrating tablet  Commonly known as:  ZOFRAN ODT  Take 1 tablet (4 mg total) by mouth every 8 (eight) hours as needed for nausea or refractory nausea / vomiting.     pantoprazole 40 MG tablet  Commonly known as:  PROTONIX  Take 1 tablet (40 mg total) by mouth 2 (two) times daily before a meal.     potassium chloride SA 20 MEQ tablet  Commonly known as:  K-DUR,KLOR-CON  Take 1 tablet (20 mEq total) by mouth 2 (two) times daily.     sucralfate 1 g tablet  Commonly known as:  CARAFATE  Take 1 tablet (1 g total) by mouth 4 (four) times daily.     VESICARE 10 MG tablet  Generic drug:  solifenacin  Take 10 mg by mouth daily.     vitamin C 500 MG tablet  Commonly known as:  ASCORBIC ACID  Take 500 mg by mouth every morning.     Zinc 50 MG Tabs  Take  50 mg by mouth 2 (two) times daily.        Discharge Assessment: Filed Vitals:   11/27/15 0516 11/27/15 1345  BP: 117/83 138/88  Pulse: 95 97  Temp: 99 F (37.2 C) 99.1 F (37.3 C)  Resp: 18 18   Skin clean, dry and intact without evidence of skin break down, no evidence of skin tears noted. IV catheter discontinued intact. Site without signs and symptoms of complications - no redness or edema noted at insertion site, patient denies c/o pain - only slight tenderness at site.  Dressing with slight pressure applied.  D/c Instructions-Education: Discharge instructions given to patient/family with verbalized understanding. D/c education completed with patient/family including follow up instructions, medication list, d/c activities limitations if indicated, with other d/c instructions as indicated by MD - patient able to verbalize understanding, all questions fully answered. Patient instructed to return to ED, call 911, or call MD for any changes in condition.  Patient escorted via EMS, attempted report times one.    Dayle Points, RN 11/27/2015 5:06 PM

## 2015-11-30 ENCOUNTER — Other Ambulatory Visit: Payer: Self-pay | Admitting: Hematology

## 2015-11-30 NOTE — Telephone Encounter (Signed)
I have sent a POF to reschedule his appointments from 8/11 to within 1-2 weeks. Thanks  Truitt Merle  11/30/2015

## 2015-11-30 NOTE — Telephone Encounter (Signed)
Discharged  On Friday, 11-27-2015 at 5:48 pm.

## 2015-12-02 ENCOUNTER — Other Ambulatory Visit: Payer: Medicare Other

## 2015-12-02 ENCOUNTER — Ambulatory Visit: Payer: Medicare Other

## 2015-12-02 ENCOUNTER — Ambulatory Visit: Payer: Medicare Other | Admitting: Hematology

## 2015-12-03 ENCOUNTER — Telehealth: Payer: Self-pay | Admitting: Hematology

## 2015-12-03 DIAGNOSIS — L98499 Non-pressure chronic ulcer of skin of other sites with unspecified severity: Secondary | ICD-10-CM | POA: Diagnosis not present

## 2015-12-03 DIAGNOSIS — L89133 Pressure ulcer of right lower back, stage 3: Secondary | ICD-10-CM | POA: Diagnosis not present

## 2015-12-03 NOTE — Telephone Encounter (Signed)
cxl 7/21 appts due to pt in a nursing home and will call back to r/s appts once transportation is set up

## 2015-12-04 ENCOUNTER — Other Ambulatory Visit: Payer: Medicare Other

## 2015-12-04 ENCOUNTER — Ambulatory Visit: Payer: Medicare Other

## 2015-12-04 ENCOUNTER — Ambulatory Visit: Payer: Medicare Other | Admitting: Hematology

## 2015-12-10 DIAGNOSIS — L89890 Pressure ulcer of other site, unstageable: Secondary | ICD-10-CM | POA: Diagnosis not present

## 2015-12-10 DIAGNOSIS — L98413 Non-pressure chronic ulcer of buttock with necrosis of muscle: Secondary | ICD-10-CM | POA: Diagnosis not present

## 2015-12-10 DIAGNOSIS — L98499 Non-pressure chronic ulcer of skin of other sites with unspecified severity: Secondary | ICD-10-CM | POA: Diagnosis not present

## 2015-12-10 DIAGNOSIS — L98412 Non-pressure chronic ulcer of buttock with fat layer exposed: Secondary | ICD-10-CM | POA: Diagnosis not present

## 2015-12-17 DIAGNOSIS — L98413 Non-pressure chronic ulcer of buttock with necrosis of muscle: Secondary | ICD-10-CM | POA: Diagnosis not present

## 2015-12-17 DIAGNOSIS — L98499 Non-pressure chronic ulcer of skin of other sites with unspecified severity: Secondary | ICD-10-CM | POA: Diagnosis not present

## 2015-12-17 DIAGNOSIS — L89890 Pressure ulcer of other site, unstageable: Secondary | ICD-10-CM | POA: Diagnosis not present

## 2015-12-17 DIAGNOSIS — L98412 Non-pressure chronic ulcer of buttock with fat layer exposed: Secondary | ICD-10-CM | POA: Diagnosis not present

## 2015-12-24 DIAGNOSIS — L89890 Pressure ulcer of other site, unstageable: Secondary | ICD-10-CM | POA: Diagnosis not present

## 2015-12-24 DIAGNOSIS — L98413 Non-pressure chronic ulcer of buttock with necrosis of muscle: Secondary | ICD-10-CM | POA: Diagnosis not present

## 2015-12-24 DIAGNOSIS — L98499 Non-pressure chronic ulcer of skin of other sites with unspecified severity: Secondary | ICD-10-CM | POA: Diagnosis not present

## 2015-12-24 DIAGNOSIS — L98412 Non-pressure chronic ulcer of buttock with fat layer exposed: Secondary | ICD-10-CM | POA: Diagnosis not present

## 2015-12-25 ENCOUNTER — Other Ambulatory Visit: Payer: Medicare Other

## 2015-12-25 ENCOUNTER — Ambulatory Visit: Payer: Medicare Other

## 2015-12-25 ENCOUNTER — Ambulatory Visit: Payer: Medicare Other | Admitting: Hematology

## 2015-12-29 ENCOUNTER — Telehealth: Payer: Self-pay | Admitting: Hematology

## 2015-12-29 ENCOUNTER — Ambulatory Visit (HOSPITAL_BASED_OUTPATIENT_CLINIC_OR_DEPARTMENT_OTHER): Payer: Medicare Other

## 2015-12-29 ENCOUNTER — Encounter: Payer: Self-pay | Admitting: Hematology

## 2015-12-29 ENCOUNTER — Other Ambulatory Visit (HOSPITAL_BASED_OUTPATIENT_CLINIC_OR_DEPARTMENT_OTHER): Payer: Medicare Other

## 2015-12-29 ENCOUNTER — Ambulatory Visit (HOSPITAL_BASED_OUTPATIENT_CLINIC_OR_DEPARTMENT_OTHER): Payer: Medicare Other | Admitting: Hematology

## 2015-12-29 VITALS — BP 160/70 | HR 89 | Temp 98.1°F | Resp 18 | Ht 72.0 in

## 2015-12-29 DIAGNOSIS — D638 Anemia in other chronic diseases classified elsewhere: Secondary | ICD-10-CM

## 2015-12-29 DIAGNOSIS — D509 Iron deficiency anemia, unspecified: Secondary | ICD-10-CM

## 2015-12-29 DIAGNOSIS — M869 Osteomyelitis, unspecified: Secondary | ICD-10-CM

## 2015-12-29 DIAGNOSIS — D649 Anemia, unspecified: Secondary | ICD-10-CM

## 2015-12-29 DIAGNOSIS — M86659 Other chronic osteomyelitis, unspecified thigh: Secondary | ICD-10-CM

## 2015-12-29 LAB — CBC WITH DIFFERENTIAL/PLATELET
BASO%: 0.6 % (ref 0.0–2.0)
BASOS ABS: 0.1 10*3/uL (ref 0.0–0.1)
EOS ABS: 0.4 10*3/uL (ref 0.0–0.5)
EOS%: 4 % (ref 0.0–7.0)
HEMATOCRIT: 33.4 % — AB (ref 38.4–49.9)
HEMOGLOBIN: 10.5 g/dL — AB (ref 13.0–17.1)
LYMPH%: 9.8 % — ABNORMAL LOW (ref 14.0–49.0)
MCH: 24.6 pg — AB (ref 27.2–33.4)
MCHC: 31.4 g/dL — ABNORMAL LOW (ref 32.0–36.0)
MCV: 78.5 fL — AB (ref 79.3–98.0)
MONO#: 1 10*3/uL — ABNORMAL HIGH (ref 0.1–0.9)
MONO%: 10.1 % (ref 0.0–14.0)
NEUT#: 7.9 10*3/uL — ABNORMAL HIGH (ref 1.5–6.5)
NEUT%: 75.5 % — AB (ref 39.0–75.0)
Platelets: 465 10*3/uL — ABNORMAL HIGH (ref 140–400)
RBC: 4.25 10*6/uL (ref 4.20–5.82)
RDW: 16.4 % — AB (ref 11.0–14.6)
WBC: 10.4 10*3/uL — ABNORMAL HIGH (ref 4.0–10.3)
lymph#: 1 10*3/uL (ref 0.9–3.3)

## 2015-12-29 LAB — FERRITIN

## 2015-12-29 LAB — IRON AND TIBC
%SAT: 12 % — AB (ref 20–55)
Iron: 20 ug/dL — ABNORMAL LOW (ref 42–163)
TIBC: 175 ug/dL — ABNORMAL LOW (ref 202–409)
UIBC: 155 ug/dL (ref 117–376)

## 2015-12-29 MED ORDER — DARBEPOETIN ALFA 100 MCG/0.5ML IJ SOSY
100.0000 ug | PREFILLED_SYRINGE | Freq: Once | INTRAMUSCULAR | Status: AC
  Administered 2015-12-29: 100 ug via SUBCUTANEOUS
  Filled 2015-12-29: qty 0.5

## 2015-12-29 NOTE — Telephone Encounter (Signed)
Gave patient avs report and appointments for September thru November  °

## 2015-12-29 NOTE — Patient Instructions (Signed)
Darbepoetin Alfa injection What is this medicine? DARBEPOETIN ALFA (dar be POE e tin AL fa) helps your body make more red blood cells. It is used to treat anemia caused by chronic kidney failure and chemotherapy. This medicine may be used for other purposes; ask your health care provider or pharmacist if you have questions. What should I tell my health care provider before I take this medicine? They need to know if you have any of these conditions: -blood clotting disorders or history of blood clots -cancer patient not on chemotherapy -cystic fibrosis -heart disease, such as angina, heart failure, or a history of a heart attack -hemoglobin level of 12 g/dL or greater -high blood pressure -low levels of folate, iron, or vitamin B12 -seizures -an unusual or allergic reaction to darbepoetin, erythropoietin, albumin, hamster proteins, latex, other medicines, foods, dyes, or preservatives -pregnant or trying to get pregnant -breast-feeding How should I use this medicine? This medicine is for injection into a vein or under the skin. It is usually given by a health care professional in a hospital or clinic setting. If you get this medicine at home, you will be taught how to prepare and give this medicine. Do not shake the solution before you withdraw a dose. Use exactly as directed. Take your medicine at regular intervals. Do not take your medicine more often than directed. It is important that you put your used needles and syringes in a special sharps container. Do not put them in a trash can. If you do not have a sharps container, call your pharmacist or healthcare provider to get one. Talk to your pediatrician regarding the use of this medicine in children. While this medicine may be used in children as young as 1 year for selected conditions, precautions do apply. Overdosage: If you think you have taken too much of this medicine contact a poison control center or emergency room at once. NOTE:  This medicine is only for you. Do not share this medicine with others. What if I miss a dose? If you miss a dose, take it as soon as you can. If it is almost time for your next dose, take only that dose. Do not take double or extra doses. What may interact with this medicine? Do not take this medicine with any of the following medications: -epoetin alfa This list may not describe all possible interactions. Give your health care provider a list of all the medicines, herbs, non-prescription drugs, or dietary supplements you use. Also tell them if you smoke, drink alcohol, or use illegal drugs. Some items may interact with your medicine. What should I watch for while using this medicine? Visit your prescriber or health care professional for regular checks on your progress and for the needed blood tests and blood pressure measurements. It is especially important for the doctor to make sure your hemoglobin level is in the desired range, to limit the risk of potential side effects and to give you the best benefit. Keep all appointments for any recommended tests. Check your blood pressure as directed. Ask your doctor what your blood pressure should be and when you should contact him or her. As your body makes more red blood cells, you may need to take iron, folic acid, or vitamin B supplements. Ask your doctor or health care provider which products are right for you. If you have kidney disease continue dietary restrictions, even though this medication can make you feel better. Talk with your doctor or health care professional about the   foods you eat and the vitamins that you take. What side effects may I notice from receiving this medicine? Side effects that you should report to your doctor or health care professional as soon as possible: -allergic reactions like skin rash, itching or hives, swelling of the face, lips, or tongue -breathing problems -changes in vision -chest pain -confusion, trouble speaking  or understanding -feeling faint or lightheaded, falls -high blood pressure -muscle aches or pains -pain, swelling, warmth in the leg -rapid weight gain -severe headaches -sudden numbness or weakness of the face, arm or leg -trouble walking, dizziness, loss of balance or coordination -seizures (convulsions) -swelling of the ankles, feet, hands -unusually weak or tired Side effects that usually do not require medical attention (report to your doctor or health care professional if they continue or are bothersome): -diarrhea -fever, chills (flu-like symptoms) -headaches -nausea, vomiting -redness, stinging, or swelling at site where injected This list may not describe all possible side effects. Call your doctor for medical advice about side effects. You may report side effects to FDA at 1-800-FDA-1088. Where should I keep my medicine? Keep out of the reach of children. Store in a refrigerator between 2 and 8 degrees C (36 and 46 degrees F). Do not freeze. Do not shake. Throw away any unused portion if using a single-dose vial. Throw away any unused medicine after the expiration date. NOTE: This sheet is a summary. It may not cover all possible information. If you have questions about this medicine, talk to your doctor, pharmacist, or health care provider.    2016, [employer]. (2008-04-15 10:23:57)  
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
## 2015-12-30 ENCOUNTER — Ambulatory Visit: Payer: Medicare Other | Admitting: Infectious Diseases

## 2015-12-31 DIAGNOSIS — L98412 Non-pressure chronic ulcer of buttock with fat layer exposed: Secondary | ICD-10-CM | POA: Diagnosis not present

## 2015-12-31 DIAGNOSIS — L98413 Non-pressure chronic ulcer of buttock with necrosis of muscle: Secondary | ICD-10-CM | POA: Diagnosis not present

## 2015-12-31 DIAGNOSIS — L89133 Pressure ulcer of right lower back, stage 3: Secondary | ICD-10-CM | POA: Diagnosis not present

## 2015-12-31 DIAGNOSIS — L98499 Non-pressure chronic ulcer of skin of other sites with unspecified severity: Secondary | ICD-10-CM | POA: Diagnosis not present

## 2016-01-01 ENCOUNTER — Ambulatory Visit (INDEPENDENT_AMBULATORY_CARE_PROVIDER_SITE_OTHER): Payer: Medicare Other | Admitting: Infectious Diseases

## 2016-01-01 ENCOUNTER — Encounter: Payer: Self-pay | Admitting: Infectious Diseases

## 2016-01-01 DIAGNOSIS — S31000D Unspecified open wound of lower back and pelvis without penetration into retroperitoneum, subsequent encounter: Secondary | ICD-10-CM

## 2016-01-01 MED ORDER — AMOXICILLIN-POT CLAVULANATE 875-125 MG PO TABS: 1.0000 | ORAL_TABLET | Freq: Two times a day (BID) | ORAL | 0 refills | Status: AC
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
## 2016-01-01 NOTE — Assessment & Plan Note (Signed)
He appears to be doing well.  Will give him rx for augmentin that he can take if he has worsening wound d/c, odor, fever.  Will not check labs today.  Will see him back in 4 months, hopefully we can keep him out of hospital.  Wound f/u at [facility] Will f/u with Dr Marla Roe.

## 2016-01-07 DIAGNOSIS — L89133 Pressure ulcer of right lower back, stage 3: Secondary | ICD-10-CM | POA: Diagnosis not present

## 2016-01-07 DIAGNOSIS — L98413 Non-pressure chronic ulcer of buttock with necrosis of muscle: Secondary | ICD-10-CM | POA: Diagnosis not present

## 2016-01-07 DIAGNOSIS — L89890 Pressure ulcer of other site, unstageable: Secondary | ICD-10-CM | POA: Diagnosis not present

## 2016-01-14 DIAGNOSIS — L98419 Non-pressure chronic ulcer of buttock with unspecified severity: Secondary | ICD-10-CM | POA: Diagnosis not present

## 2016-01-14 DIAGNOSIS — L89134 Pressure ulcer of right lower back, stage 4: Secondary | ICD-10-CM | POA: Diagnosis not present

## 2016-01-14 DIAGNOSIS — L98413 Non-pressure chronic ulcer of buttock with necrosis of muscle: Secondary | ICD-10-CM | POA: Diagnosis not present

## 2016-01-21 DIAGNOSIS — S31102A Unspecified open wound of abdominal wall, epigastric region without penetration into peritoneal cavity, initial encounter: Secondary | ICD-10-CM | POA: Diagnosis not present

## 2016-01-21 DIAGNOSIS — L8944 Pressure ulcer of contiguous site of back, buttock and hip, stage 4: Secondary | ICD-10-CM | POA: Diagnosis not present

## 2016-01-21 DIAGNOSIS — Z933 Colostomy status: Secondary | ICD-10-CM | POA: Diagnosis not present

## 2016-01-21 DIAGNOSIS — S31809A Unspecified open wound of unspecified buttock, initial encounter: Secondary | ICD-10-CM | POA: Diagnosis not present

## 2016-01-21 DIAGNOSIS — L89134 Pressure ulcer of right lower back, stage 4: Secondary | ICD-10-CM | POA: Diagnosis not present

## 2016-01-21 DIAGNOSIS — L98413 Non-pressure chronic ulcer of buttock with necrosis of muscle: Secondary | ICD-10-CM | POA: Diagnosis not present

## 2016-01-21 DIAGNOSIS — L89890 Pressure ulcer of other site, unstageable: Secondary | ICD-10-CM | POA: Diagnosis not present

## 2016-01-26 ENCOUNTER — Other Ambulatory Visit: Payer: Medicare Other

## 2016-01-26 ENCOUNTER — Ambulatory Visit: Payer: Medicare Other

## 2016-01-28 DIAGNOSIS — L98413 Non-pressure chronic ulcer of buttock with necrosis of muscle: Secondary | ICD-10-CM | POA: Diagnosis not present

## 2016-01-28 DIAGNOSIS — L89890 Pressure ulcer of other site, unstageable: Secondary | ICD-10-CM | POA: Diagnosis not present

## 2016-01-28 DIAGNOSIS — L89134 Pressure ulcer of right lower back, stage 4: Secondary | ICD-10-CM | POA: Diagnosis not present

## 2016-01-30 DIAGNOSIS — L89153 Pressure ulcer of sacral region, stage 3: Secondary | ICD-10-CM | POA: Diagnosis not present

## 2016-01-30 DIAGNOSIS — Z435 Encounter for attention to cystostomy: Secondary | ICD-10-CM | POA: Diagnosis not present

## 2016-01-30 DIAGNOSIS — Z433 Encounter for attention to colostomy: Secondary | ICD-10-CM | POA: Diagnosis not present

## 2016-01-30 DIAGNOSIS — L89113 Pressure ulcer of right upper back, stage 3: Secondary | ICD-10-CM | POA: Diagnosis not present

## 2016-01-30 DIAGNOSIS — L89314 Pressure ulcer of right buttock, stage 4: Secondary | ICD-10-CM | POA: Diagnosis not present

## 2016-01-30 DIAGNOSIS — L89323 Pressure ulcer of left buttock, stage 3: Secondary | ICD-10-CM | POA: Diagnosis not present

## 2016-02-01 ENCOUNTER — Telehealth: Payer: Self-pay | Admitting: *Deleted

## 2016-02-01 NOTE — Telephone Encounter (Signed)
Pt called and left message re:  Pt missed lab and injection appts  on 01/26/16.   Pt wanted to know if pt should reschedule missed appts  soon , or just keep appt as scheduled on  Oct 10.   Message routed to Dr. Burr Medico. Pt's   Phone     [phone].

## 2016-02-02 ENCOUNTER — Emergency Department (HOSPITAL_COMMUNITY)
Admission: EM | Admit: 2016-02-02 | Discharge: 2016-02-02 | Disposition: A | Discharge status: Home / Self Care | Payer: Medicare Other | Attending: Emergency Medicine | Admitting: Emergency Medicine

## 2016-02-02 ENCOUNTER — Emergency Department (HOSPITAL_COMMUNITY): Payer: Medicare Other

## 2016-02-02 ENCOUNTER — Telehealth: Payer: Self-pay | Admitting: Hematology

## 2016-02-02 ENCOUNTER — Encounter (HOSPITAL_COMMUNITY): Payer: Self-pay | Admitting: Emergency Medicine

## 2016-02-02 DIAGNOSIS — I6789 Other cerebrovascular disease: Secondary | ICD-10-CM | POA: Diagnosis not present

## 2016-02-02 DIAGNOSIS — Z79899 Other long term (current) drug therapy: Secondary | ICD-10-CM | POA: Insufficient documentation

## 2016-02-02 DIAGNOSIS — R140 Abdominal distension (gaseous): Secondary | ICD-10-CM | POA: Diagnosis not present

## 2016-02-02 DIAGNOSIS — G8253 Quadriplegia, C5-C7 complete: Secondary | ICD-10-CM | POA: Diagnosis not present

## 2016-02-02 DIAGNOSIS — N390 Urinary tract infection, site not specified: Secondary | ICD-10-CM | POA: Diagnosis not present

## 2016-02-02 DIAGNOSIS — G8250 Quadriplegia, unspecified: Secondary | ICD-10-CM | POA: Diagnosis not present

## 2016-02-02 DIAGNOSIS — K5901 Slow transit constipation: Secondary | ICD-10-CM | POA: Insufficient documentation

## 2016-02-02 DIAGNOSIS — Z7951 Long term (current) use of inhaled steroids: Secondary | ICD-10-CM | POA: Insufficient documentation

## 2016-02-02 DIAGNOSIS — J90 Pleural effusion, not elsewhere classified: Secondary | ICD-10-CM | POA: Diagnosis not present

## 2016-02-02 DIAGNOSIS — R112 Nausea with vomiting, unspecified: Secondary | ICD-10-CM | POA: Diagnosis not present

## 2016-02-02 DIAGNOSIS — I10 Essential (primary) hypertension: Secondary | ICD-10-CM | POA: Insufficient documentation

## 2016-02-02 DIAGNOSIS — Z4682 Encounter for fitting and adjustment of non-vascular catheter: Secondary | ICD-10-CM | POA: Diagnosis not present

## 2016-02-02 DIAGNOSIS — G839 Paralytic syndrome, unspecified: Secondary | ICD-10-CM | POA: Diagnosis not present

## 2016-02-02 LAB — URINALYSIS, ROUTINE W REFLEX MICROSCOPIC
Bilirubin Urine: NEGATIVE
Glucose, UA: NEGATIVE mg/dL
KETONES UR: NEGATIVE mg/dL
NITRITE: POSITIVE — AB
PROTEIN: NEGATIVE mg/dL
Specific Gravity, Urine: 1.027 (ref 1.005–1.030)
pH: 6 (ref 5.0–8.0)

## 2016-02-02 LAB — COMPREHENSIVE METABOLIC PANEL
ALT: 13 U/L — ABNORMAL LOW (ref 17–63)
AST: 15 U/L (ref 15–41)
Albumin: 3 g/dL — ABNORMAL LOW (ref 3.5–5.0)
Alkaline Phosphatase: 78 U/L (ref 38–126)
Anion gap: 10 (ref 5–15)
BILIRUBIN TOTAL: 0.7 mg/dL (ref 0.3–1.2)
BUN: 22 mg/dL — AB (ref 6–20)
CO2: 23 mmol/L (ref 22–32)
CREATININE: 0.47 mg/dL — AB (ref 0.61–1.24)
Calcium: 8.8 mg/dL — ABNORMAL LOW (ref 8.9–10.3)
Chloride: 109 mmol/L (ref 101–111)
Glucose, Bld: 110 mg/dL — ABNORMAL HIGH (ref 65–99)
POTASSIUM: 3.9 mmol/L (ref 3.5–5.1)
Sodium: 142 mmol/L (ref 135–145)
TOTAL PROTEIN: 8.2 g/dL — AB (ref 6.5–8.1)

## 2016-02-02 LAB — CBC
HEMATOCRIT: 30.5 % — AB (ref 39.0–52.0)
Hemoglobin: 9.2 g/dL — ABNORMAL LOW (ref 13.0–17.0)
MCH: 24.9 pg — ABNORMAL LOW (ref 26.0–34.0)
MCHC: 30.2 g/dL (ref 30.0–36.0)
MCV: 82.7 fL (ref 78.0–100.0)
PLATELETS: 388 10*3/uL (ref 150–400)
RBC: 3.69 MIL/uL — AB (ref 4.22–5.81)
RDW: 16.5 % — AB (ref 11.5–15.5)
WBC: 13.5 10*3/uL — AB (ref 4.0–10.5)

## 2016-02-02 LAB — LIPASE, BLOOD: LIPASE: 23 U/L (ref 11–51)

## 2016-02-02 LAB — URINE MICROSCOPIC-ADD ON

## 2016-02-02 MED ORDER — ONDANSETRON 8 MG PO TBDP
8.0000 mg | ORAL_TABLET | Freq: Once | ORAL | Status: AC
  Administered 2016-02-02: 8 mg via ORAL
  Filled 2016-02-02: qty 1

## 2016-02-02 MED ORDER — HYDROMORPHONE HCL 1 MG/ML IJ SOLN
1.0000 mg | Freq: Once | INTRAMUSCULAR | Status: DC
  Filled 2016-02-02: qty 1

## 2016-02-02 MED ORDER — CIPROFLOXACIN HCL 500 MG PO TABS
500.0000 mg | ORAL_TABLET | Freq: Once | ORAL | Status: AC
  Administered 2016-02-02: 500 mg via ORAL
  Filled 2016-02-02: qty 1

## 2016-02-02 MED ORDER — HYDROMORPHONE HCL 1 MG/ML IJ SOLN: 1.0000 mg | Freq: Once | INTRAMUSCULAR | Status: DC

## 2016-02-02 MED ORDER — CIPROFLOXACIN HCL 500 MG PO TABS: 500.0000 mg | ORAL_TABLET | Freq: Two times a day (BID) | ORAL | 0 refills | Status: DC

## 2016-02-02 MED ORDER — MILK AND MOLASSES ENEMA
1.0000 | Freq: Once | RECTAL | Status: DC
  Filled 2016-02-02: qty 250

## 2016-02-02 MED ORDER — LACTULOSE 10 GM/15ML PO SOLN
30.0000 g | Freq: Once | ORAL | Status: AC
  Administered 2016-02-02: 30 g via ORAL
  Filled 2016-02-02: qty 45

## 2016-02-02 MED ORDER — POLYETHYLENE GLYCOL 3350 17 G PO PACK
17.0000 g | PACK | Freq: Every day | ORAL | Status: DC
  Filled 2016-02-02: qty 1

## 2016-02-02 MED ORDER — METOCLOPRAMIDE HCL 10 MG PO TABS: 10.0000 mg | ORAL_TABLET | Freq: Four times a day (QID) | ORAL | 0 refills | Status: DC

## 2016-02-02 NOTE — ED Notes (Signed)
[facility] called for transport.  

## 2016-02-02 NOTE — ED Notes (Signed)
Patient complaining of increased abdominal pain/swelling. Dr. Lacinda Axon made aware. Received pain medication order.

## 2016-02-02 NOTE — ED Notes (Signed)
Bed: [facility] Expected date:  Expected time:  Means of arrival:  Comments: No bed 

## 2016-02-02 NOTE — ED Notes (Signed)
Patient stating his pain is increasing, feels more distension/ swelling. Dr. Lacinda Axon made aware and states he will come see patient.
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
## 2016-02-02 NOTE — ED Notes (Signed)
Patient transported to X-ray 

## 2016-02-02 NOTE — ED Notes (Signed)
MD at bedside. 

## 2016-02-02 NOTE — ED Triage Notes (Signed)
Per EMS, patient has had nausea since Sunday. Patient has episodes of vomiting on Sunday/Monday. Patient unable to keep food/fluids down. Denies diarrhea or abdominal pain. Patient is from home.

## 2016-02-02 NOTE — ED Notes (Signed)
[facility] notified of transportation need 

## 2016-02-02 NOTE — ED Notes (Addendum)
MD made aware that patient is a difficulty stick. Phlebotomy made aware ultrasound blood draw is needed.

## 2016-02-02 NOTE — Telephone Encounter (Signed)
lvm to inform pt of 9/20 lab/inj appt per LOS

## 2016-02-02 NOTE — ED Notes (Signed)
Discharge instructions, follow up care, and prescriptions reviewed with patient. Patient verbalized understanding. 

## 2016-02-02 NOTE — ED Notes (Signed)
Bed: WA23 Expected date:  Expected time:  Means of arrival:  Comments: Hall B 

## 2016-02-02 NOTE — ED Notes (Signed)
[facility] arrived to receive patient.  

## 2016-02-02 NOTE — ED Notes (Signed)
Portable at bedside 

## 2016-02-02 NOTE — ED Notes (Signed)
Patient beginning to feel nauseous again. MD aware.

## 2016-02-03 ENCOUNTER — Ambulatory Visit: Payer: Medicare Other

## 2016-02-03 ENCOUNTER — Other Ambulatory Visit: Payer: Medicare Other

## 2016-02-03 LAB — URINE CULTURE

## 2016-02-03 IMAGING — CR DG CHEST 2V
4 series · 4 of 4 positions shown · non-contrast
Comparison: US ABSCESS DRAINAGE dated 02/22/2013

CLINICAL DATA: Fever

EXAM:
CHEST  2 VIEW

[w chest lat (1 of 2)]
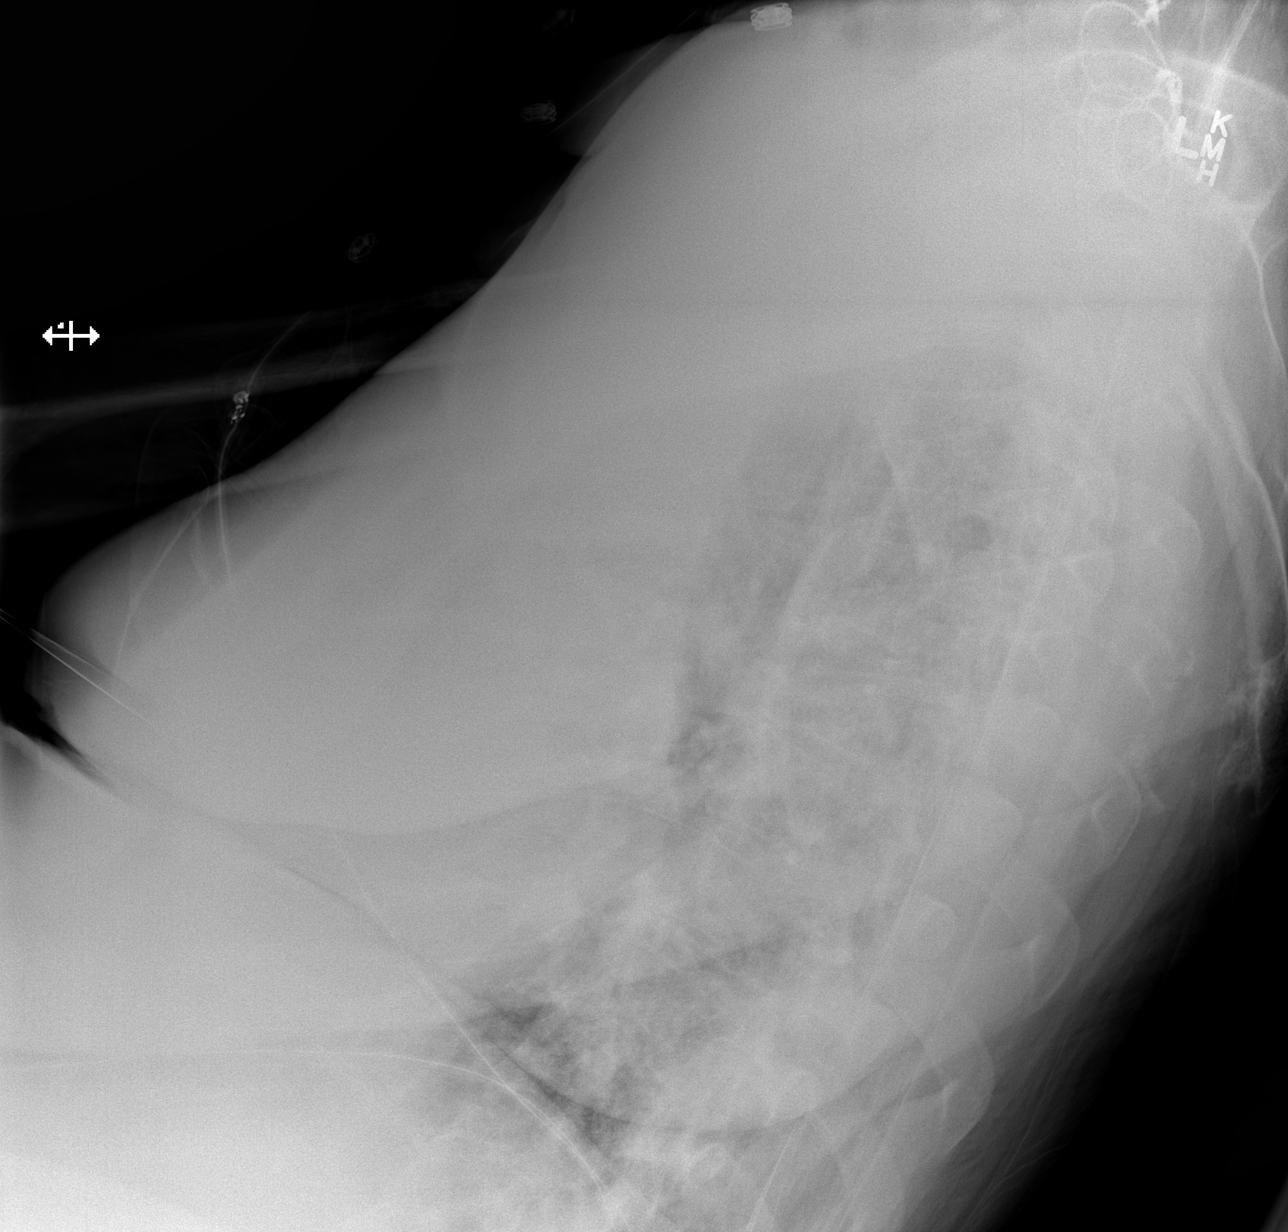

[w chest lat (2 of 2)]
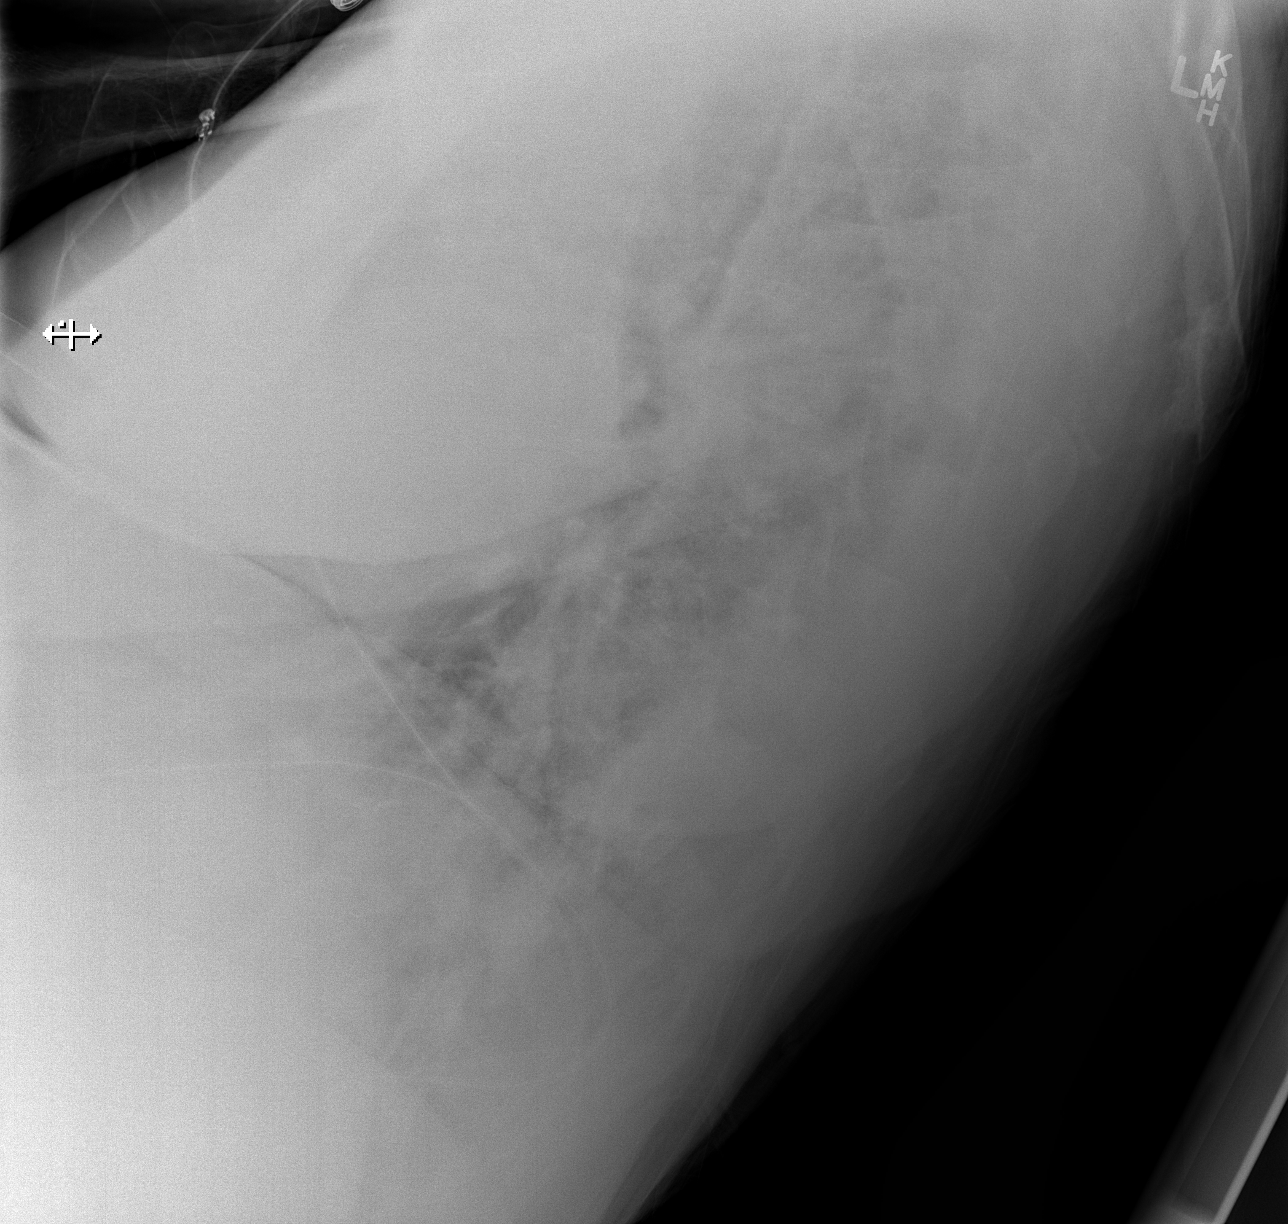

[x chest ap (1 of 2)]
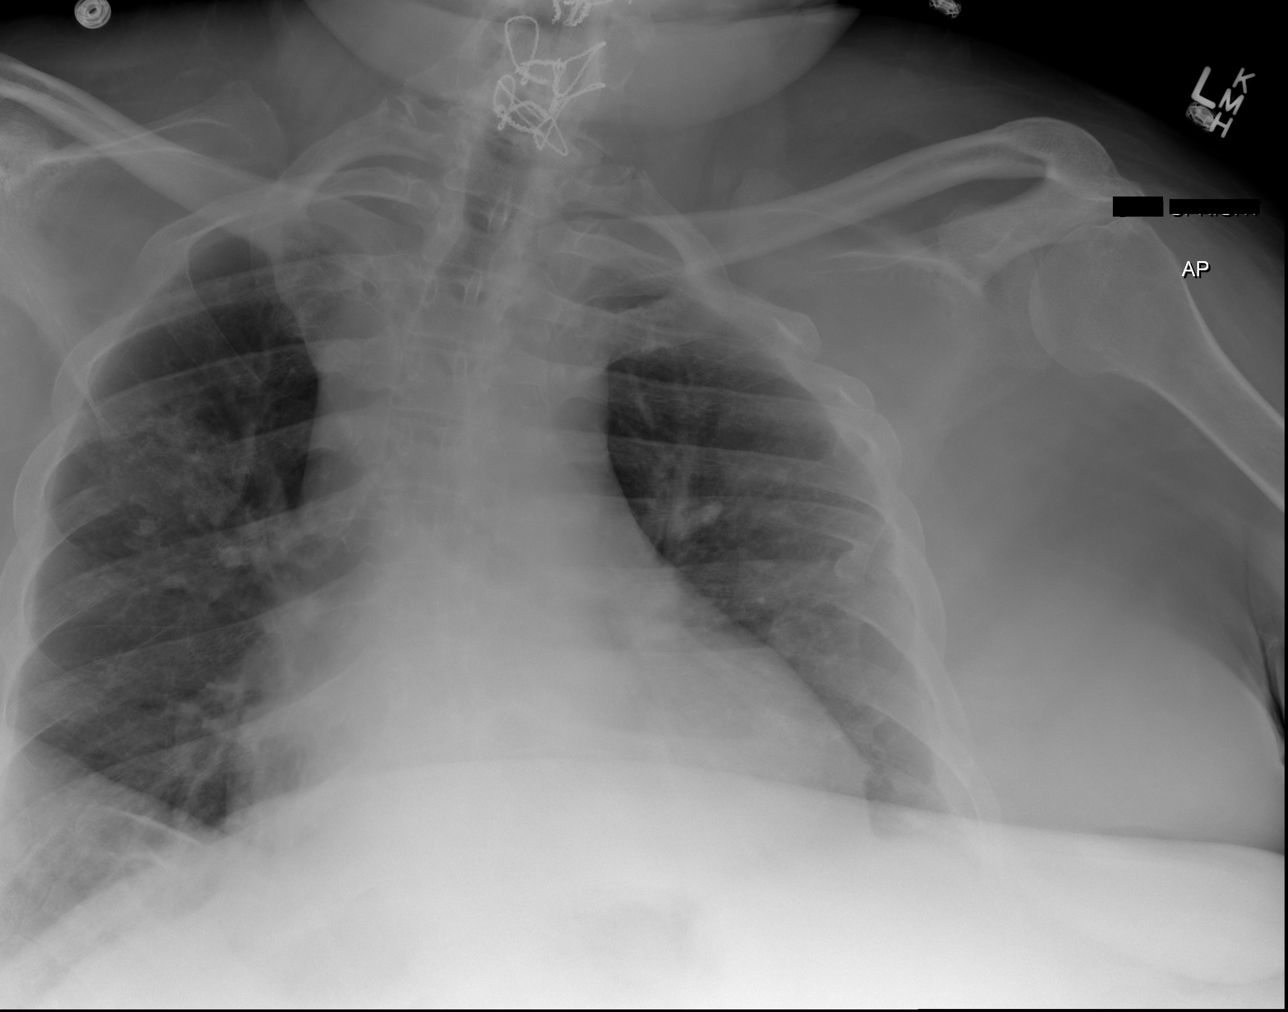

[x chest ap (2 of 2)]
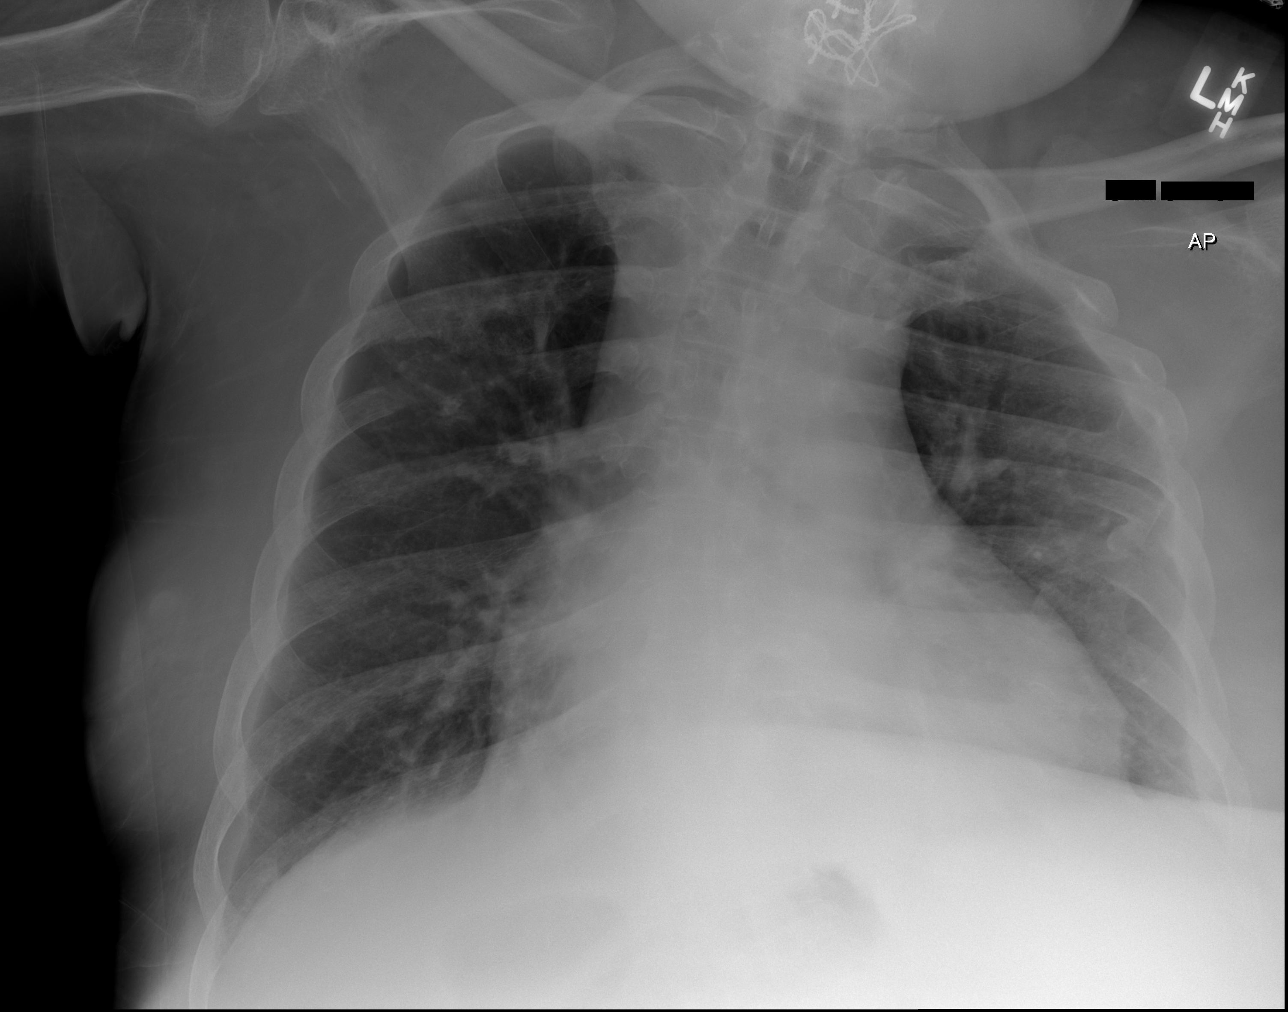

[4 of 4 positions shown; findings below may reference images not displayed]

FINDINGS: Low lung volumes. Cardiac silhouette is enlarged. There is decreased
prominence of the interstitial markings. No focal regions of
consolidation identified. The osseous structures demonstrate
dextroscoliosis in the thoracolumbar spine.
IMPRESSION: Interstitial findings are less prominent when compared to previous
study. An interstitial infectious or inflammatory infiltrate cannot
be excluded. No component of these findings likely represent chronic
bronchitic changes.

## 2016-02-09 DIAGNOSIS — L8944 Pressure ulcer of contiguous site of back, buttock and hip, stage 4: Secondary | ICD-10-CM | POA: Diagnosis not present

## 2016-02-09 DIAGNOSIS — Z933 Colostomy status: Secondary | ICD-10-CM | POA: Diagnosis not present

## 2016-02-09 DIAGNOSIS — S31102A Unspecified open wound of abdominal wall, epigastric region without penetration into peritoneal cavity, initial encounter: Secondary | ICD-10-CM | POA: Diagnosis not present

## 2016-02-09 DIAGNOSIS — S31809A Unspecified open wound of unspecified buttock, initial encounter: Secondary | ICD-10-CM | POA: Diagnosis not present

## 2016-02-10 DIAGNOSIS — Z09 Encounter for follow-up examination after completed treatment for conditions other than malignant neoplasm: Secondary | ICD-10-CM | POA: Diagnosis not present

## 2016-02-10 DIAGNOSIS — Z23 Encounter for immunization: Secondary | ICD-10-CM | POA: Diagnosis not present

## 2016-02-10 DIAGNOSIS — K5900 Constipation, unspecified: Secondary | ICD-10-CM | POA: Diagnosis not present

## 2016-02-10 DIAGNOSIS — Z993 Dependence on wheelchair: Secondary | ICD-10-CM | POA: Diagnosis not present

## 2016-02-10 DIAGNOSIS — G8250 Quadriplegia, unspecified: Secondary | ICD-10-CM | POA: Diagnosis not present

## 2016-02-10 IMAGING — CT CT GUIDANCE NEEDLE PLACEMENT
4 series · 8 of 16 positions shown, 11 images · non-contrast
Comparison: none

CLINICAL DATA: Quadriplegia and chronic pelvic and hip
osteomyelitis. Recent MRI has demonstrated suggestion of fluid
collection adjacent to the right hip joint.

[Series 5: add scan 5.0 b30f · axial · 0.61mm/px · z∈[+1328,+1334]mm · 2 of 4 slices shown, 5 images (1 of 4)]
[im 2/4  soft-tissue]
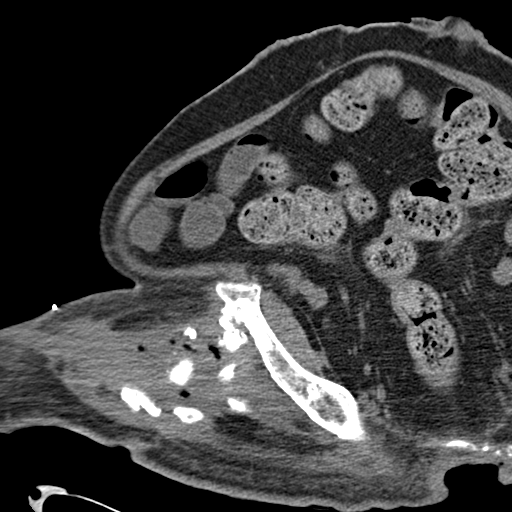
[im 2/4  lung]
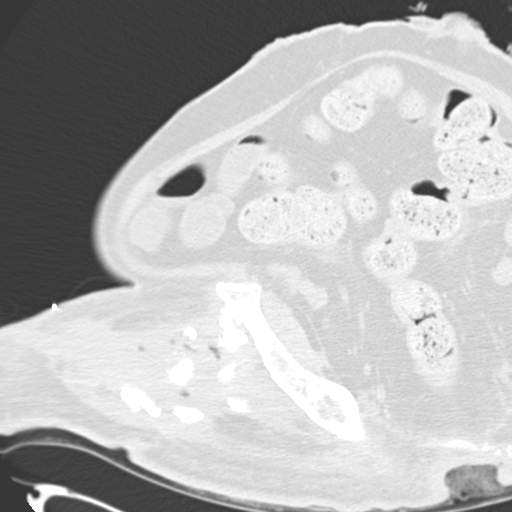
[im 2/4  bone]
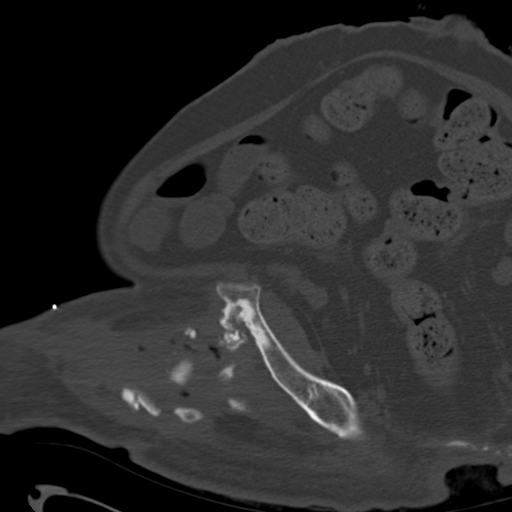
[im 3/4  soft-tissue]
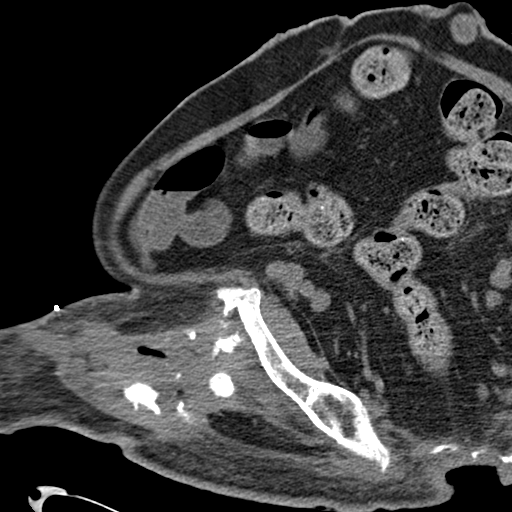
[im 3/4  lung]
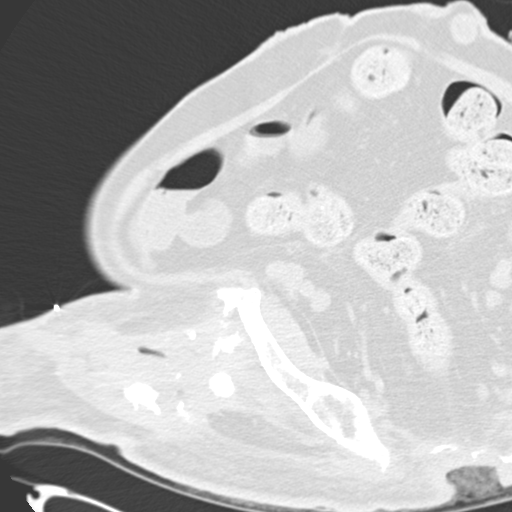

[Series 6: add scan 5.0 b30f · axial · 0.61mm/px · z∈[+1328,+1334]mm · 2 of 4 slices shown (2 of 4)]
[im 2/4  soft-tissue]
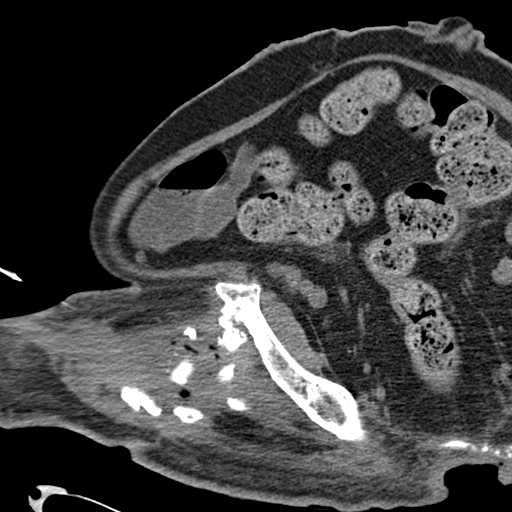
[im 3/4  soft-tissue]
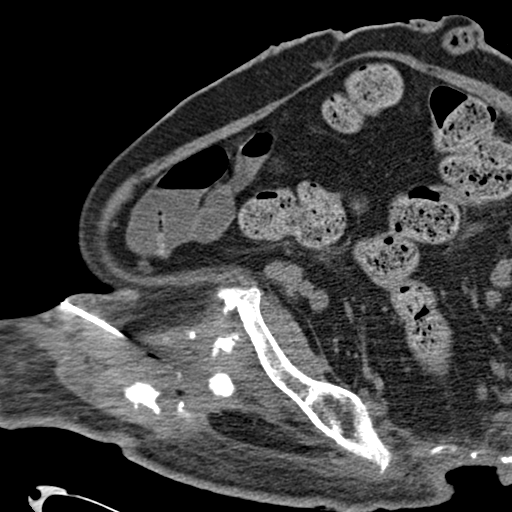

[Series 7: add scan 5.0 b30f · axial · 0.61mm/px · z∈[+1328,+1334]mm · 2 of 4 slices shown (3 of 4)]
[im 2/4  soft-tissue]
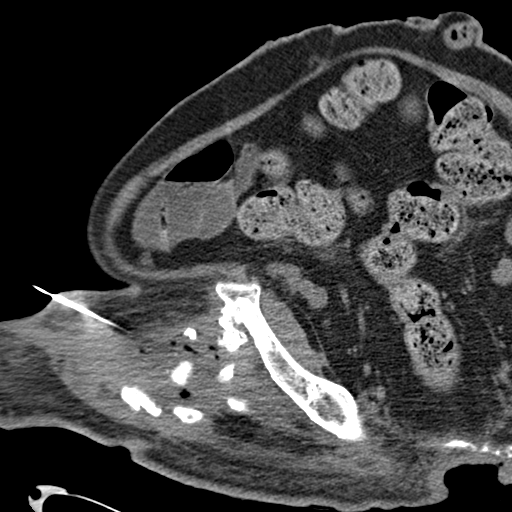
[im 3/4  soft-tissue]
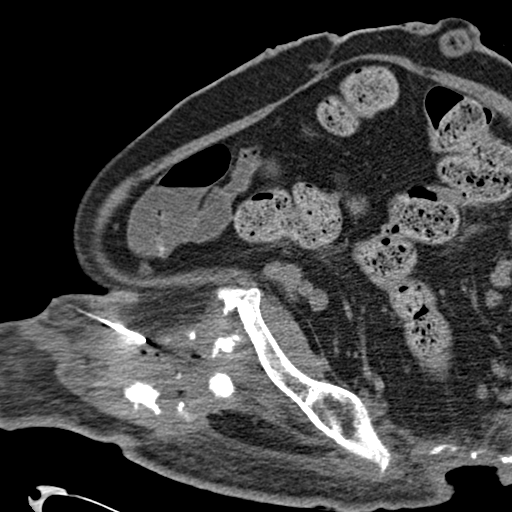

[Series 8: add scan 5.0 b30f · axial · 0.61mm/px · z∈[+1328,+1334]mm · 2 of 4 slices shown (4 of 4)]
[im 2/4  soft-tissue]
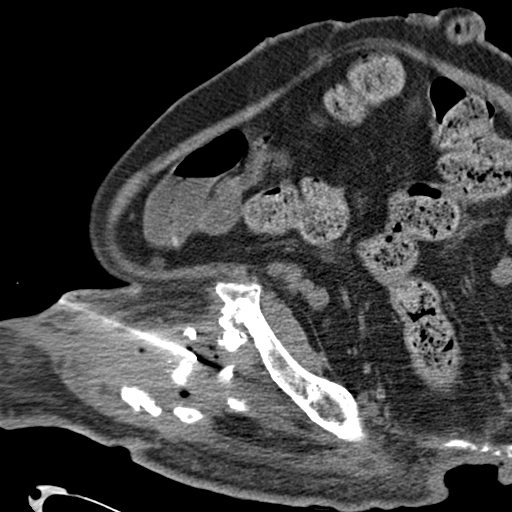
[im 3/4  soft-tissue]
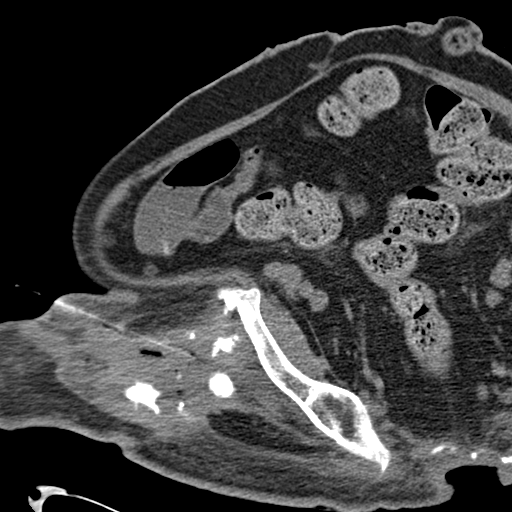

[8 of 16 positions shown; findings below may reference images not displayed]

EXAM:
CT GUIDED ASPIRATION OF RIGHT HIP

ANESTHESIA/SEDATION:
0.5 Mg IV Versed 50 mcg IV Fentanyl

Total Moderate Sedation Time: 10 minutes

PROCEDURE:
The procedure, risks, benefits, and alternatives were explained to
the patient. Questions regarding the procedure were encouraged and
answered. The patient understands and consents to the procedure.

The right hip region was prepped with Betadinein a sterile fashion,
and a sterile drape was applied covering the operative field. A
sterile gown and sterile gloves were used for the procedure. Local
anesthesia was provided with 1% Lidocaine.

CT was performed in a supine position. Under CT guidance, an 18
gauge trocar needle was advanced into the region of the right hip
joint. The needle was repositioned several times as aspiration was
performed. A sample of fluid was sent for culture analysis.

COMPLICATIONS:
None
FINDINGS: The 18 gauge needle was advanced in multiple locations in and around
the right hip joint including the destroyed femoral head and
adjacent inflammation. There was very little return of free fluid
with only a small amount of bloody fluid return. This was combined
with some sterile saline and sent for culture.
IMPRESSION: CT-guided aspiration at the level of chronic right hip
osteomyelitis. Aspiration yielded only a small amount of bloody
fluid which was sent for culture analysis.

## 2016-02-11 DIAGNOSIS — L8944 Pressure ulcer of contiguous site of back, buttock and hip, stage 4: Secondary | ICD-10-CM | POA: Diagnosis not present

## 2016-02-11 DIAGNOSIS — S31809A Unspecified open wound of unspecified buttock, initial encounter: Secondary | ICD-10-CM | POA: Diagnosis not present

## 2016-02-11 DIAGNOSIS — S31102A Unspecified open wound of abdominal wall, epigastric region without penetration into peritoneal cavity, initial encounter: Secondary | ICD-10-CM | POA: Diagnosis not present

## 2016-02-11 DIAGNOSIS — Z933 Colostomy status: Secondary | ICD-10-CM | POA: Diagnosis not present

## 2016-02-12 ENCOUNTER — Encounter (HOSPITAL_BASED_OUTPATIENT_CLINIC_OR_DEPARTMENT_OTHER): Payer: Medicare Other | Attending: Internal Medicine

## 2016-02-12 DIAGNOSIS — L89154 Pressure ulcer of sacral region, stage 4: Secondary | ICD-10-CM | POA: Diagnosis not present

## 2016-02-12 DIAGNOSIS — L89893 Pressure ulcer of other site, stage 3: Secondary | ICD-10-CM | POA: Diagnosis not present

## 2016-02-12 DIAGNOSIS — M8638 Chronic multifocal osteomyelitis, other site: Secondary | ICD-10-CM | POA: Diagnosis not present

## 2016-02-12 DIAGNOSIS — G8250 Quadriplegia, unspecified: Secondary | ICD-10-CM | POA: Diagnosis not present

## 2016-02-12 DIAGNOSIS — G4730 Sleep apnea, unspecified: Secondary | ICD-10-CM | POA: Diagnosis not present

## 2016-02-12 DIAGNOSIS — I10 Essential (primary) hypertension: Secondary | ICD-10-CM | POA: Insufficient documentation

## 2016-02-12 DIAGNOSIS — Z9989 Dependence on other enabling machines and devices: Secondary | ICD-10-CM | POA: Diagnosis not present

## 2016-02-12 DIAGNOSIS — M8668 Other chronic osteomyelitis, other site: Secondary | ICD-10-CM | POA: Diagnosis not present

## 2016-02-12 DIAGNOSIS — L89323 Pressure ulcer of left buttock, stage 3: Secondary | ICD-10-CM | POA: Insufficient documentation

## 2016-02-12 DIAGNOSIS — N485 Ulcer of penis: Secondary | ICD-10-CM | POA: Diagnosis not present

## 2016-02-12 DIAGNOSIS — L89213 Pressure ulcer of right hip, stage 3: Secondary | ICD-10-CM | POA: Diagnosis not present

## 2016-02-12 DIAGNOSIS — L89892 Pressure ulcer of other site, stage 2: Secondary | ICD-10-CM | POA: Diagnosis not present

## 2016-02-13 IMAGING — XA IR US GUIDE VASC ACCESS RIGHT
1 series · 4 of 4 positions shown · non-contrast
Comparison: SP FLUORO GUIDE CV LINE*L* dated 11/07/2012;

INDICATION: Poor venous access, the bilateral upper extremities are no longer
amenable to PICC line placement, in need of intravenous access for
for blood draws and medication administration.

[Series 300: ir fluoro guide cv midline picc *l* · 4 of 4 slices shown]
[im 1/4]
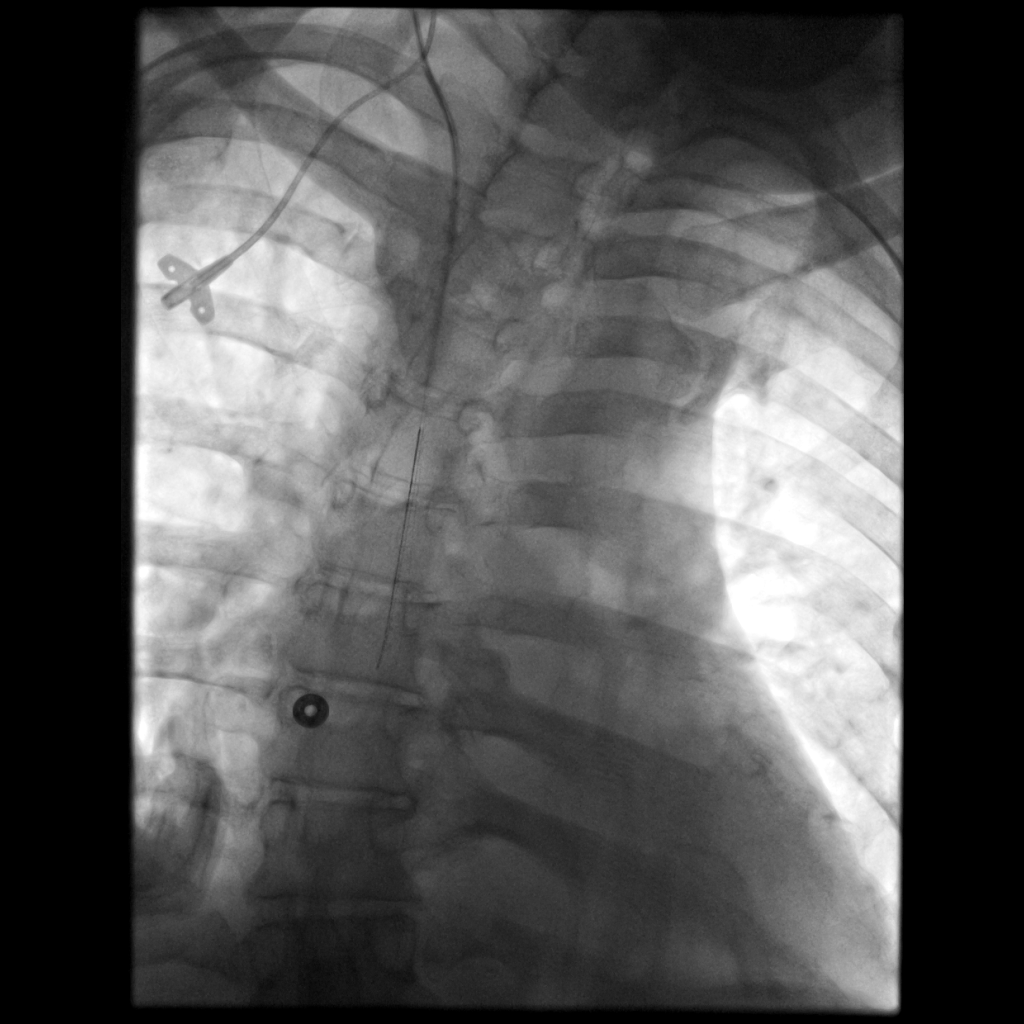
[im 2/4]
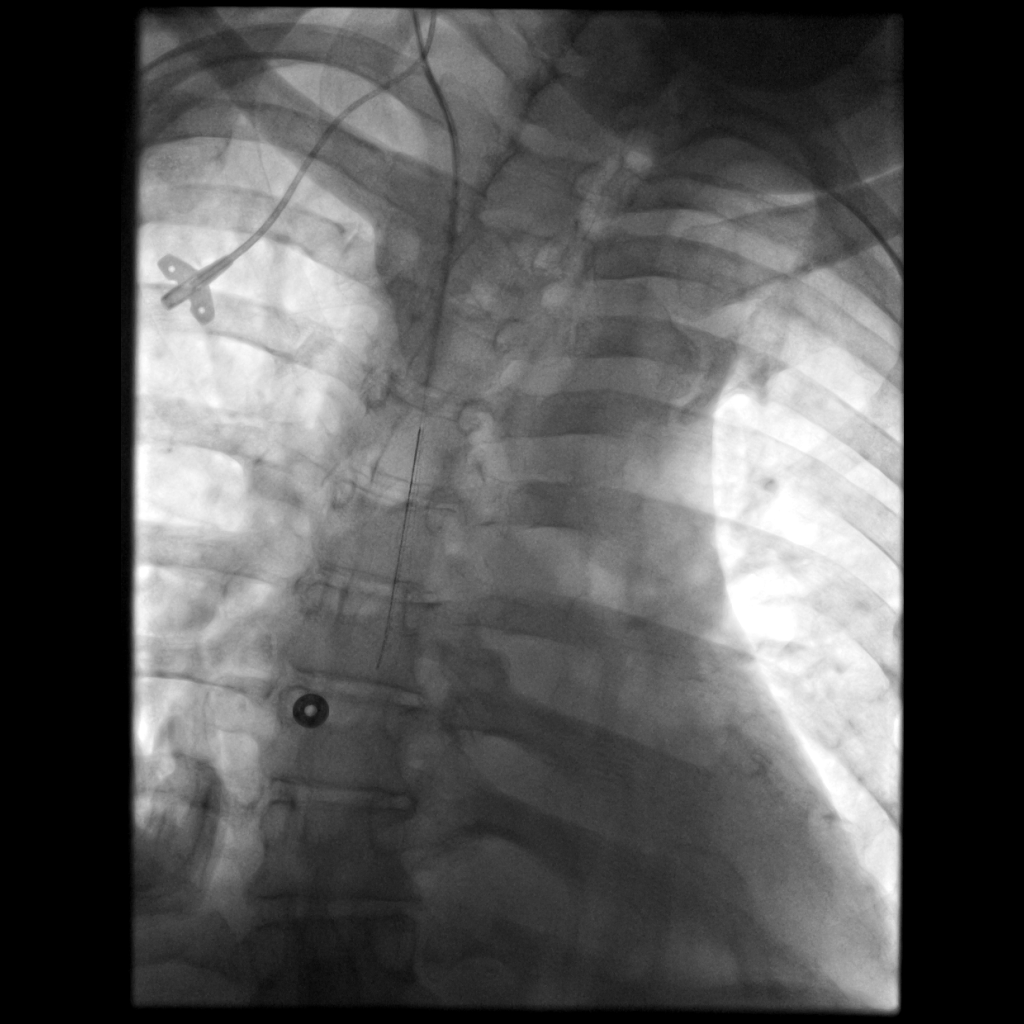
[im 3/4]
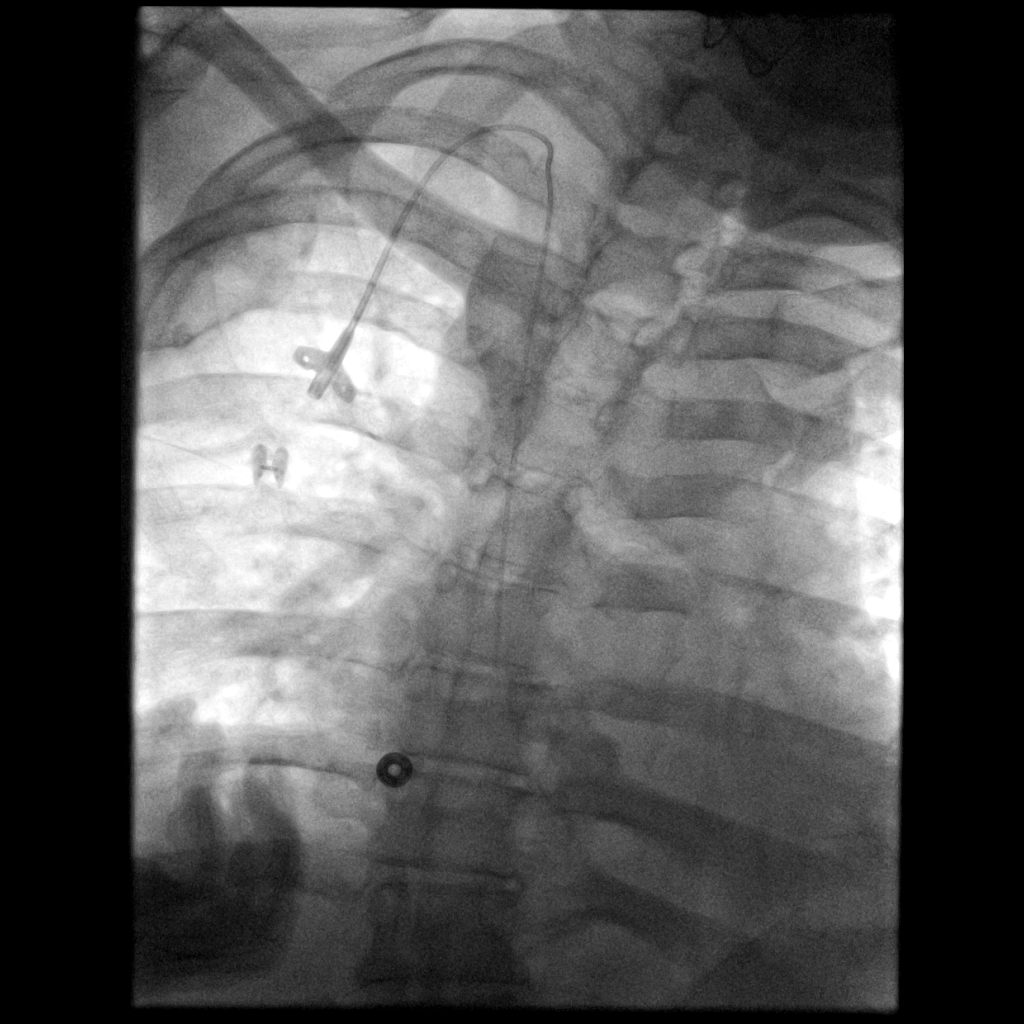
[im 4/4]
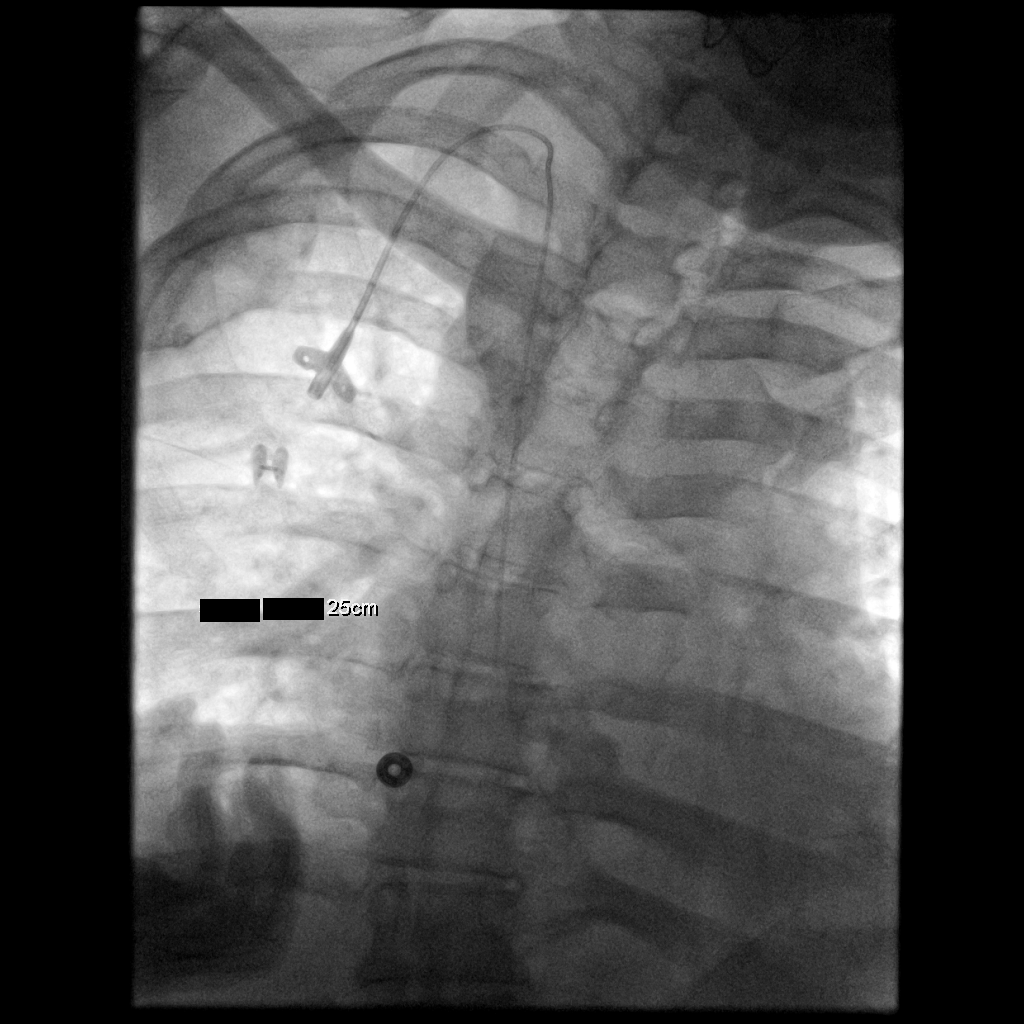

[4 of 4 positions shown; findings below may reference images not displayed]

EXAM:
TUNNELED PICC PLACEMENT WITH ULTRASOUND AND FLUOROSCOPIC GUIDANCE

MEDICATIONS:
The patient is currently admitted to the hospital and receiving
intravenous antibiotics. The IV antibiotic was given in an
appropriate time interval prior to skin puncture.

ANESTHESIA/SEDATION:
None

CONTRAST:  None
IR FLUORO
GUIDE CV LINE*R* dated 04/22/2012; SP FLUORO GUIDE CV LINE*L* dated
02/08/2012; SP FLUORO GUIDE CV LINE*L* dated 09/01/2012

FLUOROSCOPY TIME:  30 seconds



After creating a small venotomy incision, a micropuncture kit was
utilized to access the right internal jugular vein under direct,
real-time ultrasound guidance after the overlying soft tissues were
anesthetized with 1% lidocaine with epinephrine. Ultrasound image
documentation was performed. The microwire was kinked to measure
appropriate catheter length. A peel-away a micropuncture sheath was
placed under intermittent fluoroscopic guidance. A PICC measuring 25
cm from tip to cuff was tunneled in a retrograde fashion from the
anterior chest wall to the venotomy incision.

The catheter was then placed through the peel-away sheath with tips
ultimately positioned within the superior cavoatrial junction. Final
catheter positioning was confirmed and documented with a spot
radiographic image. The catheter aspirates and flushes normally. The
catheter was flushed with appropriate volume heparin dwells.

The catheter exit site was secured with a 0-Prolene retention
suture. The venotomy incision was closed with an interrupted 4-0
Vicryl, Dermabond and Somme. Dressings were applied. The
patient tolerated the procedure well without immediate post
procedural complication.

COMPLICATIONS:
None immediate
IMPRESSION: Successful placement of 25 cm tip to cuff tunneled PICC via the
right internal jugular vein with tips terminating within the
superior cavoatrial junction. The catheter is ready for immediate
use.

## 2016-02-15 ENCOUNTER — Encounter (HOSPITAL_COMMUNITY): Payer: Self-pay | Admitting: Emergency Medicine

## 2016-02-15 ENCOUNTER — Emergency Department (HOSPITAL_COMMUNITY)
Admission: EM | Admit: 2016-02-15 | Discharge: 2016-02-16 | Disposition: A | Discharge status: Home / Self Care | Payer: Medicare Other | Attending: Emergency Medicine | Admitting: Emergency Medicine

## 2016-02-15 DIAGNOSIS — Y658 Other specified misadventures during surgical and medical care: Secondary | ICD-10-CM | POA: Diagnosis not present

## 2016-02-15 DIAGNOSIS — Z79899 Other long term (current) drug therapy: Secondary | ICD-10-CM | POA: Diagnosis not present

## 2016-02-15 DIAGNOSIS — I10 Essential (primary) hypertension: Secondary | ICD-10-CM | POA: Insufficient documentation

## 2016-02-15 DIAGNOSIS — Z4889 Encounter for other specified surgical aftercare: Secondary | ICD-10-CM | POA: Diagnosis not present

## 2016-02-15 DIAGNOSIS — T83198A Other mechanical complication of other urinary devices and implants, initial encounter: Secondary | ICD-10-CM | POA: Diagnosis not present

## 2016-02-15 DIAGNOSIS — Z435 Encounter for attention to cystostomy: Secondary | ICD-10-CM

## 2016-02-15 DIAGNOSIS — T83498A Other mechanical complication of other prosthetic devices, implants and grafts of genital tract, initial encounter: Secondary | ICD-10-CM | POA: Diagnosis not present

## 2016-02-15 DIAGNOSIS — T83021A Displacement of indwelling urethral catheter, initial encounter: Secondary | ICD-10-CM | POA: Insufficient documentation

## 2016-02-15 NOTE — ED Notes (Signed)
MD aware of BP
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
## 2016-02-15 NOTE — ED Notes (Signed)
Bed: [id number] Expected date:  Expected time:  Means of arrival:  Comments: EMS 50 yo male wheelchair bound-catheter came out

## 2016-02-15 NOTE — ED Triage Notes (Signed)
Pt's family noticed that foley catheter had come out; no hemorrhage or blood noted; unknown what time this occurred; pt is quadraplegic

## 2016-02-16 DIAGNOSIS — T83091A Other mechanical complication of indwelling urethral catheter, initial encounter: Secondary | ICD-10-CM | POA: Diagnosis not present

## 2016-02-16 DIAGNOSIS — S31102A Unspecified open wound of abdominal wall, epigastric region without penetration into peritoneal cavity, initial encounter: Secondary | ICD-10-CM | POA: Diagnosis not present

## 2016-02-16 DIAGNOSIS — S31809A Unspecified open wound of unspecified buttock, initial encounter: Secondary | ICD-10-CM | POA: Diagnosis not present

## 2016-02-16 DIAGNOSIS — L8944 Pressure ulcer of contiguous site of back, buttock and hip, stage 4: Secondary | ICD-10-CM | POA: Diagnosis not present

## 2016-02-16 DIAGNOSIS — Z933 Colostomy status: Secondary | ICD-10-CM | POA: Diagnosis not present

## 2016-02-16 NOTE — ED Notes (Signed)
2f foley inserted into suprapubic site. Balloon filled with 28cc saline. Dressed with gauze on each side of catheter and secured with paper tape.

## 2016-02-16 NOTE — ED Notes (Signed)
Transportation arranged with [facility].  

## 2016-02-18 DIAGNOSIS — N319 Neuromuscular dysfunction of bladder, unspecified: Secondary | ICD-10-CM | POA: Diagnosis not present

## 2016-02-23 ENCOUNTER — Other Ambulatory Visit: Payer: Medicare Other

## 2016-02-23 ENCOUNTER — Ambulatory Visit: Payer: Medicare Other

## 2016-02-25 DIAGNOSIS — L89154 Pressure ulcer of sacral region, stage 4: Secondary | ICD-10-CM | POA: Diagnosis not present

## 2016-02-25 DIAGNOSIS — Z7409 Other reduced mobility: Secondary | ICD-10-CM | POA: Diagnosis not present

## 2016-02-26 DIAGNOSIS — Z933 Colostomy status: Secondary | ICD-10-CM | POA: Diagnosis not present

## 2016-02-26 DIAGNOSIS — L8944 Pressure ulcer of contiguous site of back, buttock and hip, stage 4: Secondary | ICD-10-CM | POA: Diagnosis not present

## 2016-02-26 DIAGNOSIS — S31102A Unspecified open wound of abdominal wall, epigastric region without penetration into peritoneal cavity, initial encounter: Secondary | ICD-10-CM | POA: Diagnosis not present

## 2016-02-26 DIAGNOSIS — S31809A Unspecified open wound of unspecified buttock, initial encounter: Secondary | ICD-10-CM | POA: Diagnosis not present

## 2016-02-29 DIAGNOSIS — Z933 Colostomy status: Secondary | ICD-10-CM | POA: Diagnosis not present

## 2016-02-29 DIAGNOSIS — S31809A Unspecified open wound of unspecified buttock, initial encounter: Secondary | ICD-10-CM | POA: Diagnosis not present

## 2016-02-29 DIAGNOSIS — S31102A Unspecified open wound of abdominal wall, epigastric region without penetration into peritoneal cavity, initial encounter: Secondary | ICD-10-CM | POA: Diagnosis not present

## 2016-02-29 DIAGNOSIS — L8944 Pressure ulcer of contiguous site of back, buttock and hip, stage 4: Secondary | ICD-10-CM | POA: Diagnosis not present

## 2016-03-02 ENCOUNTER — Ambulatory Visit (HOSPITAL_BASED_OUTPATIENT_CLINIC_OR_DEPARTMENT_OTHER): Payer: Medicare Other

## 2016-03-02 ENCOUNTER — Other Ambulatory Visit (HOSPITAL_BASED_OUTPATIENT_CLINIC_OR_DEPARTMENT_OTHER): Payer: Medicare Other

## 2016-03-02 VITALS — BP 102/71 | HR 83 | Temp 97.9°F | Resp 20

## 2016-03-02 DIAGNOSIS — M869 Osteomyelitis, unspecified: Secondary | ICD-10-CM

## 2016-03-02 DIAGNOSIS — D509 Iron deficiency anemia, unspecified: Secondary | ICD-10-CM

## 2016-03-02 DIAGNOSIS — D638 Anemia in other chronic diseases classified elsewhere: Secondary | ICD-10-CM

## 2016-03-02 LAB — CBC WITH DIFFERENTIAL/PLATELET
BASO%: 0.4 % (ref 0.0–2.0)
BASOS ABS: 0 10*3/uL (ref 0.0–0.1)
EOS%: 6.1 % (ref 0.0–7.0)
Eosinophils Absolute: 0.5 10*3/uL (ref 0.0–0.5)
HCT: 31 % — ABNORMAL LOW (ref 38.4–49.9)
HEMOGLOBIN: 9.5 g/dL — AB (ref 13.0–17.1)
LYMPH#: 1.7 10*3/uL (ref 0.9–3.3)
LYMPH%: 22.1 % (ref 14.0–49.0)
MCH: 24.3 pg — AB (ref 27.2–33.4)
MCHC: 30.6 g/dL — AB (ref 32.0–36.0)
MCV: 79.3 fL (ref 79.3–98.0)
MONO#: 0.9 10*3/uL (ref 0.1–0.9)
MONO%: 11.5 % (ref 0.0–14.0)
NEUT#: 4.7 10*3/uL (ref 1.5–6.5)
NEUT%: 59.9 % (ref 39.0–75.0)
Platelets: 444 10*3/uL — ABNORMAL HIGH (ref 140–400)
RBC: 3.91 10*6/uL — ABNORMAL LOW (ref 4.20–5.82)
RDW: 15.3 % — AB (ref 11.0–14.6)
WBC: 7.9 10*3/uL (ref 4.0–10.3)

## 2016-03-02 MED ORDER — DARBEPOETIN ALFA 100 MCG/0.5ML IJ SOSY
100.0000 ug | PREFILLED_SYRINGE | Freq: Once | INTRAMUSCULAR | Status: AC
  Administered 2016-03-02: 100 ug via SUBCUTANEOUS
  Filled 2016-03-02: qty 0.5

## 2016-03-02 NOTE — Patient Instructions (Signed)
Darbepoetin Alfa injection What is this medicine? DARBEPOETIN ALFA (dar be POE e tin AL fa) helps your body make more red blood cells. It is used to treat anemia caused by chronic kidney failure and chemotherapy. This medicine may be used for other purposes; ask your health care provider or pharmacist if you have questions. What should I tell my health care provider before I take this medicine? They need to know if you have any of these conditions: -blood clotting disorders or history of blood clots -cancer patient not on chemotherapy -cystic fibrosis -heart disease, such as angina, heart failure, or a history of a heart attack -hemoglobin level of 12 g/dL or greater -high blood pressure -low levels of folate, iron, or vitamin B12 -seizures -an unusual or allergic reaction to darbepoetin, erythropoietin, albumin, hamster proteins, latex, other medicines, foods, dyes, or preservatives -pregnant or trying to get pregnant -breast-feeding How should I use this medicine? This medicine is for injection into a vein or under the skin. It is usually given by a health care professional in a hospital or clinic setting. If you get this medicine at home, you will be taught how to prepare and give this medicine. Do not shake the solution before you withdraw a dose. Use exactly as directed. Take your medicine at regular intervals. Do not take your medicine more often than directed. It is important that you put your used needles and syringes in a special sharps container. Do not put them in a trash can. If you do not have a sharps container, call your pharmacist or healthcare provider to get one. Talk to your pediatrician regarding the use of this medicine in children. While this medicine may be used in children as young as 1 year for selected conditions, precautions do apply. Overdosage: If you think you have taken too much of this medicine contact a poison control center or emergency room at once. NOTE:  This medicine is only for you. Do not share this medicine with others. What if I miss a dose? If you miss a dose, take it as soon as you can. If it is almost time for your next dose, take only that dose. Do not take double or extra doses. What may interact with this medicine? Do not take this medicine with any of the following medications: -epoetin alfa This list may not describe all possible interactions. Give your health care provider a list of all the medicines, herbs, non-prescription drugs, or dietary supplements you use. Also tell them if you smoke, drink alcohol, or use illegal drugs. Some items may interact with your medicine. What should I watch for while using this medicine? Visit your prescriber or health care professional for regular checks on your progress and for the needed blood tests and blood pressure measurements. It is especially important for the doctor to make sure your hemoglobin level is in the desired range, to limit the risk of potential side effects and to give you the best benefit. Keep all appointments for any recommended tests. Check your blood pressure as directed. Ask your doctor what your blood pressure should be and when you should contact him or her. As your body makes more red blood cells, you may need to take iron, folic acid, or vitamin B supplements. Ask your doctor or health care provider which products are right for you. If you have kidney disease continue dietary restrictions, even though this medication can make you feel better. Talk with your doctor or health care professional about the   foods you eat and the vitamins that you take. What side effects may I notice from receiving this medicine? Side effects that you should report to your doctor or health care professional as soon as possible: -allergic reactions like skin rash, itching or hives, swelling of the face, lips, or tongue -breathing problems -changes in vision -chest pain -confusion, trouble speaking  or understanding -feeling faint or lightheaded, falls -high blood pressure -muscle aches or pains -pain, swelling, warmth in the leg -rapid weight gain -severe headaches -sudden numbness or weakness of the face, arm or leg -trouble walking, dizziness, loss of balance or coordination -seizures (convulsions) -swelling of the ankles, feet, hands -unusually weak or tired Side effects that usually do not require medical attention (report to your doctor or health care professional if they continue or are bothersome): -diarrhea -fever, chills (flu-like symptoms) -headaches -nausea, vomiting -redness, stinging, or swelling at site where injected This list may not describe all possible side effects. Call your doctor for medical advice about side effects. You may report side effects to FDA at 1-800-FDA-1088. Where should I keep my medicine? Keep out of the reach of children. Store in a refrigerator between 2 and 8 degrees C (36 and 46 degrees F). Do not freeze. Do not shake. Throw away any unused portion if using a single-dose vial. Throw away any unused medicine after the expiration date. NOTE: This sheet is a summary. It may not cover all possible information. If you have questions about this medicine, talk to your doctor, pharmacist, or health care provider.    2016, [employer]. (2008-04-15 10:23:57)  

## 2016-03-10 ENCOUNTER — Inpatient Hospital Stay (HOSPITAL_COMMUNITY)
Admission: EM | Admit: 2016-03-10 | Discharge: 2016-03-16 | DRG: 698 | Disposition: A | Discharge status: Home Health | Payer: Medicare Other | Attending: Family Medicine | Admitting: Family Medicine

## 2016-03-10 ENCOUNTER — Other Ambulatory Visit: Payer: Self-pay

## 2016-03-10 ENCOUNTER — Emergency Department (HOSPITAL_COMMUNITY): Payer: Medicare Other

## 2016-03-10 ENCOUNTER — Encounter (HOSPITAL_COMMUNITY): Payer: Self-pay | Admitting: Emergency Medicine

## 2016-03-10 ENCOUNTER — Other Ambulatory Visit (HOSPITAL_COMMUNITY): Payer: Self-pay

## 2016-03-10 DIAGNOSIS — G4733 Obstructive sleep apnea (adult) (pediatric): Secondary | ICD-10-CM | POA: Diagnosis present

## 2016-03-10 DIAGNOSIS — D62 Acute posthemorrhagic anemia: Secondary | ICD-10-CM | POA: Diagnosis present

## 2016-03-10 DIAGNOSIS — T83091A Other mechanical complication of indwelling urethral catheter, initial encounter: Principal | ICD-10-CM | POA: Diagnosis present

## 2016-03-10 DIAGNOSIS — I959 Hypotension, unspecified: Secondary | ICD-10-CM | POA: Diagnosis present

## 2016-03-10 DIAGNOSIS — L89304 Pressure ulcer of unspecified buttock, stage 4: Secondary | ICD-10-CM | POA: Diagnosis present

## 2016-03-10 DIAGNOSIS — Z0389 Encounter for observation for other suspected diseases and conditions ruled out: Secondary | ICD-10-CM | POA: Diagnosis not present

## 2016-03-10 DIAGNOSIS — L89894 Pressure ulcer of other site, stage 4: Secondary | ICD-10-CM | POA: Diagnosis not present

## 2016-03-10 DIAGNOSIS — F329 Major depressive disorder, single episode, unspecified: Secondary | ICD-10-CM | POA: Diagnosis present

## 2016-03-10 DIAGNOSIS — L89153 Pressure ulcer of sacral region, stage 3: Secondary | ICD-10-CM | POA: Diagnosis present

## 2016-03-10 DIAGNOSIS — L89154 Pressure ulcer of sacral region, stage 4: Secondary | ICD-10-CM | POA: Diagnosis present

## 2016-03-10 DIAGNOSIS — B9562 Methicillin resistant Staphylococcus aureus infection as the cause of diseases classified elsewhere: Secondary | ICD-10-CM | POA: Diagnosis present

## 2016-03-10 DIAGNOSIS — Z888 Allergy status to other drugs, medicaments and biological substances status: Secondary | ICD-10-CM

## 2016-03-10 DIAGNOSIS — T83498A Other mechanical complication of other prosthetic devices, implants and grafts of genital tract, initial encounter: Secondary | ICD-10-CM | POA: Diagnosis not present

## 2016-03-10 DIAGNOSIS — G8250 Quadriplegia, unspecified: Secondary | ICD-10-CM | POA: Diagnosis present

## 2016-03-10 DIAGNOSIS — Z87440 Personal history of urinary (tract) infections: Secondary | ICD-10-CM

## 2016-03-10 DIAGNOSIS — A419 Sepsis, unspecified organism: Secondary | ICD-10-CM | POA: Diagnosis not present

## 2016-03-10 DIAGNOSIS — Z79899 Other long term (current) drug therapy: Secondary | ICD-10-CM | POA: Diagnosis not present

## 2016-03-10 DIAGNOSIS — M8660 Other chronic osteomyelitis, unspecified site: Secondary | ICD-10-CM | POA: Diagnosis present

## 2016-03-10 DIAGNOSIS — N3289 Other specified disorders of bladder: Secondary | ICD-10-CM | POA: Diagnosis not present

## 2016-03-10 DIAGNOSIS — Z933 Colostomy status: Secondary | ICD-10-CM | POA: Diagnosis not present

## 2016-03-10 DIAGNOSIS — E871 Hypo-osmolality and hyponatremia: Secondary | ICD-10-CM | POA: Diagnosis not present

## 2016-03-10 DIAGNOSIS — B957 Other staphylococcus as the cause of diseases classified elsewhere: Secondary | ICD-10-CM | POA: Diagnosis not present

## 2016-03-10 DIAGNOSIS — E876 Hypokalemia: Secondary | ICD-10-CM | POA: Diagnosis present

## 2016-03-10 DIAGNOSIS — G40909 Epilepsy, unspecified, not intractable, without status epilepticus: Secondary | ICD-10-CM | POA: Diagnosis present

## 2016-03-10 DIAGNOSIS — Z8711 Personal history of peptic ulcer disease: Secondary | ICD-10-CM | POA: Diagnosis not present

## 2016-03-10 DIAGNOSIS — Y738 Miscellaneous gastroenterology and urology devices associated with adverse incidents, not elsewhere classified: Secondary | ICD-10-CM | POA: Diagnosis present

## 2016-03-10 DIAGNOSIS — Z9989 Dependence on other enabling machines and devices: Secondary | ICD-10-CM

## 2016-03-10 DIAGNOSIS — K219 Gastro-esophageal reflux disease without esophagitis: Secondary | ICD-10-CM | POA: Diagnosis present

## 2016-03-10 DIAGNOSIS — T83198A Other mechanical complication of other urinary devices and implants, initial encounter: Secondary | ICD-10-CM | POA: Diagnosis not present

## 2016-03-10 DIAGNOSIS — N390 Urinary tract infection, site not specified: Secondary | ICD-10-CM | POA: Diagnosis not present

## 2016-03-10 DIAGNOSIS — L89103 Pressure ulcer of unspecified part of back, stage 3: Secondary | ICD-10-CM | POA: Diagnosis present

## 2016-03-10 DIAGNOSIS — N3091 Cystitis, unspecified with hematuria: Secondary | ICD-10-CM | POA: Diagnosis not present

## 2016-03-10 DIAGNOSIS — B964 Proteus (mirabilis) (morganii) as the cause of diseases classified elsewhere: Secondary | ICD-10-CM | POA: Diagnosis not present

## 2016-03-10 DIAGNOSIS — D638 Anemia in other chronic diseases classified elsewhere: Secondary | ICD-10-CM | POA: Diagnosis present

## 2016-03-10 DIAGNOSIS — I10 Essential (primary) hypertension: Secondary | ICD-10-CM | POA: Diagnosis present

## 2016-03-10 DIAGNOSIS — R310 Gross hematuria: Secondary | ICD-10-CM | POA: Diagnosis not present

## 2016-03-10 DIAGNOSIS — R319 Hematuria, unspecified: Secondary | ICD-10-CM | POA: Diagnosis present

## 2016-03-10 DIAGNOSIS — D509 Iron deficiency anemia, unspecified: Secondary | ICD-10-CM | POA: Diagnosis present

## 2016-03-10 LAB — BASIC METABOLIC PANEL
Anion gap: 7 (ref 5–15)
BUN: 13 mg/dL (ref 6–20)
CHLORIDE: 104 mmol/L (ref 101–111)
CO2: 22 mmol/L (ref 22–32)
CREATININE: 0.55 mg/dL — AB (ref 0.61–1.24)
Calcium: 8.1 mg/dL — ABNORMAL LOW (ref 8.9–10.3)
GFR calc Af Amer: >60 mL/min (ref >60)
GFR calc non Af Amer: >60 mL/min (ref >60)
GLUCOSE: 98 mg/dL (ref 65–99)
Potassium: 4 mmol/L (ref 3.5–5.1)
Sodium: 133 mmol/L — ABNORMAL LOW (ref 135–145)

## 2016-03-10 LAB — URINALYSIS, ROUTINE W REFLEX MICROSCOPIC
GLUCOSE, UA: NEGATIVE mg/dL
Ketones, ur: 40 mg/dL — AB
Nitrite: POSITIVE — AB
PH: 6 (ref 5.0–8.0)
Protein, ur: >300 mg/dL — AB
SPECIFIC GRAVITY, URINE: 1.011 (ref 1.005–1.030)

## 2016-03-10 LAB — CBC
HCT: 25.7 % — ABNORMAL LOW (ref 39.0–52.0)
Hemoglobin: 7.8 g/dL — ABNORMAL LOW (ref 13.0–17.0)
MCH: 23.9 pg — AB (ref 26.0–34.0)
MCHC: 30.4 g/dL (ref 30.0–36.0)
MCV: 78.8 fL (ref 78.0–100.0)
PLATELETS: 399 10*3/uL (ref 150–400)
RBC: 3.26 MIL/uL — ABNORMAL LOW (ref 4.22–5.81)
RDW: 15.5 % (ref 11.5–15.5)
WBC: 9.6 10*3/uL (ref 4.0–10.5)

## 2016-03-10 LAB — URINE MICROSCOPIC-ADD ON

## 2016-03-10 LAB — I-STAT CG4 LACTIC ACID, ED: Lactic Acid, Venous: 0.52 mmol/L (ref 0.5–1.9)

## 2016-03-10 MED ORDER — DEXTROSE 5 % IV SOLN: 2.0000 g | Freq: Once | INTRAVENOUS | Status: DC

## 2016-03-10 MED ORDER — VANCOMYCIN HCL IN DEXTROSE 1-5 GM/200ML-% IV SOLN
1000.0000 mg | Freq: Once | INTRAVENOUS | Status: AC
  Administered 2016-03-10: 1000 mg via INTRAVENOUS
  Filled 2016-03-10: qty 200

## 2016-03-10 MED ORDER — SODIUM CHLORIDE 0.9 % IV BOLUS (SEPSIS)
1000.0000 mL | Freq: Once | INTRAVENOUS | Status: AC
  Administered 2016-03-10: 1000 mL via INTRAVENOUS

## 2016-03-10 MED ORDER — VANCOMYCIN HCL IN DEXTROSE 1-5 GM/200ML-% IV SOLN
1000.0000 mg | Freq: Two times a day (BID) | INTRAVENOUS | Status: DC
  Administered 2016-03-11 – 2016-03-16 (×11): 1000 mg via INTRAVENOUS
  Filled 2016-03-10 (×11): qty 200

## 2016-03-10 MED ORDER — ACETAMINOPHEN 325 MG PO TABS
650.0000 mg | ORAL_TABLET | Freq: Once | ORAL | Status: AC | PRN
  Administered 2016-03-10: 650 mg via ORAL
  Filled 2016-03-10: qty 2

## 2016-03-10 MED ORDER — CEFEPIME HCL 2 G IJ SOLR
2.0000 g | Freq: Once | INTRAMUSCULAR | Status: DC
  Administered 2016-03-10: 2 g via INTRAVENOUS
  Filled 2016-03-10: qty 2

## 2016-03-10 MED ORDER — DEXTROSE 5 % IV SOLN: 2.0000 g | Freq: Two times a day (BID) | INTRAVENOUS | Status: DC

## 2016-03-10 MED ORDER — IOPAMIDOL (ISOVUE-300) INJECTION 61%: 100.0000 mL | Freq: Once | INTRAVENOUS | Status: DC | PRN

## 2016-03-10 NOTE — ED Notes (Signed)
Yellow top blood culture sent only had 66mL of blood. Collected from central line by Jeneen Rinks, MD.
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
## 2016-03-10 NOTE — ED Notes (Signed)
E Link updated.

## 2016-03-10 NOTE — ED Notes (Signed)
Jeneen Rinks, MD notified of BP 83/60.
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
## 2016-03-10 NOTE — ED Notes (Signed)
PA made aware of lack of access.

## 2016-03-10 NOTE — Progress Notes (Addendum)
EDCM went to speak to patient at bedside, however, patient went upstairs to unit.  Correction, patient did not go to unit, went for testing.  EDCM spoke to patient at bedside.  Patient reports he lives at home with his sister.  He reports he has a wheelchair, hoyer lift and hospital bed at home.  He reports he cam from home with a foley catheter, but it was changed in the ED this evening.  Patient reports he receives home health services from [facility] for just a visiting RN.  Patient confirms his pcp is Dr. [name].  Patient cannot think of any further home health or dme needs at this time.  No further EDCM needs at this time.

## 2016-03-10 NOTE — ED Triage Notes (Signed)
Pt brought in by EMS from home. Home health Nurse wasn't getting urine output from catheter so attempted to troubleshoot by deflating balloon when she noticed blood. Per EMS  Urine cloudy with sediment. Pt denies any abdominal pain. Pt has slight fever upon arrival and c/o chills but denies fatigue, cough, or headache. Pt AOx4. NAD noted.   EMS Vitals Temp 101.3 HR 111 BP 90/56 Sp 97% RA

## 2016-03-10 NOTE — ED Notes (Addendum)
IV team unsuccessful. Leafy Half, PA and Jeneen Rinks, MD notified.

## 2016-03-10 NOTE — ED Notes (Signed)
IV Team at bedside 

## 2016-03-10 NOTE — ED Notes (Signed)
Bed: [id number] Expected date:  Expected time:  Means of arrival:  Comments: EMS-foley out

## 2016-03-10 NOTE — ED Notes (Signed)
MD attempting central line in room at this time.

## 2016-03-10 NOTE — ED Notes (Signed)
Attempted IV insertion. Unsuccessful. IV team consulted.

## 2016-03-10 NOTE — ED Notes (Signed)
MD attempting Ultrasound IV placement. IV team unsuccessful.

## 2016-03-10 NOTE — Progress Notes (Addendum)
Pharmacy Antibiotic Note  [name] [name] is a 50 y.o. male admitted on 03/10/2016 with UTI.  Pharmacy has been consulted for cefepime dosing.  Plan: The dose of Cefepime will be adjusted to 2g q12 based on renal function.  Vancomycin 1g IV q12. Goal Trough 10-15 mcg/mL  Weight: 195 lb (88.5 kg)  Temp (24hrs), Avg:100.8 F (38.2 C), Min:100.5 F (38.1 C), Max:101.1 F (38.4 C)   Recent Labs Lab 03/10/16 1930 03/10/16 1939  WBC 9.6  --   CREATININE 0.55*  --   LATICACIDVEN  --  0.52    Estimated Creatinine Clearance: 125.1 mL/min (by C-G formula based on SCr of 0.55 mg/dL (L)).    Allergies  Allergen Reactions  . Ditropan [Oxybutynin] Other (See Comments)    hallucinations  . Feraheme [Ferumoxytol] Other (See Comments)    unknown    Thank you for allowing pharmacy to be a part of this patient's care.   Adrian Saran, PharmD, BCPS Pager [phone] 03/10/2016 8:17 PM

## 2016-03-11 DIAGNOSIS — R310 Gross hematuria: Secondary | ICD-10-CM

## 2016-03-11 DIAGNOSIS — G4733 Obstructive sleep apnea (adult) (pediatric): Secondary | ICD-10-CM

## 2016-03-11 DIAGNOSIS — I959 Hypotension, unspecified: Secondary | ICD-10-CM

## 2016-03-11 DIAGNOSIS — Z9989 Dependence on other enabling machines and devices: Secondary | ICD-10-CM

## 2016-03-11 DIAGNOSIS — D509 Iron deficiency anemia, unspecified: Secondary | ICD-10-CM

## 2016-03-11 LAB — CBC WITH DIFFERENTIAL/PLATELET
BASOS ABS: 0 10*3/uL (ref 0.0–0.1)
BASOS PCT: 0 %
EOS PCT: 2 %
Eosinophils Absolute: 0.2 10*3/uL (ref 0.0–0.7)
HEMATOCRIT: 22.6 % — AB (ref 39.0–52.0)
HEMOGLOBIN: 6.9 g/dL — AB (ref 13.0–17.0)
LYMPHS PCT: 12 %
Lymphs Abs: 0.9 10*3/uL (ref 0.7–4.0)
MCH: 24.3 pg — ABNORMAL LOW (ref 26.0–34.0)
MCHC: 30.5 g/dL (ref 30.0–36.0)
MCV: 79.6 fL (ref 78.0–100.0)
MONOS PCT: 19 %
Monocytes Absolute: 1.4 10*3/uL — ABNORMAL HIGH (ref 0.1–1.0)
NEUTROS ABS: 5 10*3/uL (ref 1.7–7.7)
Neutrophils Relative %: 67 %
Platelets: 377 10*3/uL (ref 150–400)
RBC: 2.84 MIL/uL — ABNORMAL LOW (ref 4.22–5.81)
RDW: 15.8 % — ABNORMAL HIGH (ref 11.5–15.5)
WBC: 7.5 10*3/uL (ref 4.0–10.5)

## 2016-03-11 LAB — HEMOGLOBIN AND HEMATOCRIT, BLOOD
HEMATOCRIT: 24.6 % — AB (ref 39.0–52.0)
HEMOGLOBIN: 7.7 g/dL — AB (ref 13.0–17.0)

## 2016-03-11 LAB — BASIC METABOLIC PANEL
Anion gap: 5 (ref 5–15)
BUN: 11 mg/dL (ref 6–20)
CHLORIDE: 110 mmol/L (ref 101–111)
CO2: 21 mmol/L — AB (ref 22–32)
CREATININE: 0.44 mg/dL — AB (ref 0.61–1.24)
Calcium: 7.5 mg/dL — ABNORMAL LOW (ref 8.9–10.3)
GFR calc non Af Amer: >60 mL/min (ref >60)
GLUCOSE: 97 mg/dL (ref 65–99)
Potassium: 3.4 mmol/L — ABNORMAL LOW (ref 3.5–5.1)
Sodium: 136 mmol/L (ref 135–145)

## 2016-03-11 LAB — BLOOD CULTURE ID PANEL (REFLEXED)
ACINETOBACTER BAUMANNII: NOT DETECTED
CANDIDA KRUSEI: NOT DETECTED
CANDIDA PARAPSILOSIS: NOT DETECTED
CARBAPENEM RESISTANCE: NOT DETECTED
Candida albicans: NOT DETECTED
Candida glabrata: NOT DETECTED
Candida tropicalis: NOT DETECTED
ENTEROBACTERIACEAE SPECIES: DETECTED — AB
Enterobacter cloacae complex: NOT DETECTED
Enterococcus species: NOT DETECTED
Escherichia coli: NOT DETECTED
HAEMOPHILUS INFLUENZAE: NOT DETECTED
KLEBSIELLA OXYTOCA: NOT DETECTED
KLEBSIELLA PNEUMONIAE: NOT DETECTED
Listeria monocytogenes: NOT DETECTED
METHICILLIN RESISTANCE: DETECTED — AB
Neisseria meningitidis: NOT DETECTED
PSEUDOMONAS AERUGINOSA: NOT DETECTED
Proteus species: DETECTED — AB
SERRATIA MARCESCENS: NOT DETECTED
STAPHYLOCOCCUS AUREUS BCID: NOT DETECTED
STAPHYLOCOCCUS SPECIES: DETECTED — AB
STREPTOCOCCUS PNEUMONIAE: NOT DETECTED
Streptococcus agalactiae: NOT DETECTED
Streptococcus pyogenes: NOT DETECTED
Streptococcus species: NOT DETECTED

## 2016-03-11 LAB — PREPARE RBC (CROSSMATCH)

## 2016-03-11 LAB — PROTIME-INR
INR: 1.3
Prothrombin Time: 16.3 seconds — ABNORMAL HIGH (ref 11.4–15.2)

## 2016-03-11 LAB — PROCALCITONIN: Procalcitonin: 0.43 ng/mL

## 2016-03-11 LAB — APTT: aPTT: 39 seconds — ABNORMAL HIGH (ref 24–36)

## 2016-03-11 LAB — MRSA PCR SCREENING: MRSA BY PCR: NEGATIVE

## 2016-03-11 MED ORDER — CHLORHEXIDINE GLUCONATE CLOTH 2 % EX PADS: 6.0000 | MEDICATED_PAD | Freq: Every day | CUTANEOUS | Status: DC

## 2016-03-11 MED ORDER — ZINC SULFATE 220 (50 ZN) MG PO CAPS
220.0000 mg | ORAL_CAPSULE | Freq: Two times a day (BID) | ORAL | Status: DC
Start: 2016-03-11 — End: 2016-03-16
  Administered 2016-03-11 – 2016-03-16 (×11): 220 mg via ORAL
  Filled 2016-03-11 (×11): qty 1

## 2016-03-11 MED ORDER — FERROUS SULFATE 325 (65 FE) MG PO TABS
325.0000 mg | ORAL_TABLET | Freq: Three times a day (TID) | ORAL | Status: DC
  Administered 2016-03-11 – 2016-03-16 (×18): 325 mg via ORAL
  Filled 2016-03-11 (×18): qty 1

## 2016-03-11 MED ORDER — ADULT MULTIVITAMIN W/MINERALS CH
1.0000 | ORAL_TABLET | Freq: Every morning | ORAL | Status: DC
  Administered 2016-03-11 – 2016-03-16 (×6): 1 via ORAL
  Filled 2016-03-11 (×6): qty 1

## 2016-03-11 MED ORDER — SODIUM CHLORIDE 0.9% FLUSH
10.0000 mL | INTRAVENOUS | Status: DC | PRN
  Administered 2016-03-15 (×2): 10 mL
  Filled 2016-03-11 (×2): qty 40

## 2016-03-11 MED ORDER — DEXTROSE 5 % IV SOLN
2.0000 g | INTRAVENOUS | Status: DC
  Administered 2016-03-11 – 2016-03-15 (×5): 2 g via INTRAVENOUS
  Filled 2016-03-11 (×6): qty 2

## 2016-03-11 MED ORDER — ONDANSETRON HCL 4 MG PO TABS: 4.0000 mg | ORAL_TABLET | Freq: Four times a day (QID) | ORAL | Status: DC | PRN

## 2016-03-11 MED ORDER — SODIUM CHLORIDE 0.9 % IV SOLN
Freq: Once | INTRAVENOUS | Status: AC
  Administered 2016-03-11: 04:00:00 via INTRAVENOUS

## 2016-03-11 MED ORDER — VITAMIN C 500 MG PO TABS
500.0000 mg | ORAL_TABLET | Freq: Every morning | ORAL | Status: DC
  Administered 2016-03-11 – 2016-03-16 (×6): 500 mg via ORAL
  Filled 2016-03-11 (×6): qty 1

## 2016-03-11 MED ORDER — SODIUM CHLORIDE 0.9 % IV SOLN
1.0000 g | Freq: Three times a day (TID) | INTRAVENOUS | Status: DC
  Administered 2016-03-11 (×2): 1 g via INTRAVENOUS
  Filled 2016-03-11 (×4): qty 1

## 2016-03-11 MED ORDER — DARIFENACIN HYDROBROMIDE ER 7.5 MG PO TB24
7.5000 mg | ORAL_TABLET | Freq: Every day | ORAL | Status: DC
  Administered 2016-03-11 – 2016-03-16 (×6): 7.5 mg via ORAL
  Filled 2016-03-11 (×6): qty 1

## 2016-03-11 MED ORDER — SODIUM CHLORIDE 0.9% FLUSH
3.0000 mL | Freq: Two times a day (BID) | INTRAVENOUS | Status: DC
  Administered 2016-03-11 (×3): 3 mL via INTRAVENOUS

## 2016-03-11 MED ORDER — MUPIROCIN 2 % EX OINT
1.0000 "application " | TOPICAL_OINTMENT | Freq: Two times a day (BID) | CUTANEOUS | Status: DC
  Filled 2016-03-11: qty 22

## 2016-03-11 MED ORDER — POLYETHYLENE GLYCOL 3350 17 G PO PACK: 17.0000 g | PACK | Freq: Every day | ORAL | Status: DC | PRN

## 2016-03-11 MED ORDER — HYDROCODONE-ACETAMINOPHEN 5-325 MG PO TABS: 1.0000 | ORAL_TABLET | ORAL | Status: DC | PRN

## 2016-03-11 MED ORDER — SUCRALFATE 1 G PO TABS
1.0000 g | ORAL_TABLET | Freq: Four times a day (QID) | ORAL | Status: DC
  Administered 2016-03-11 – 2016-03-16 (×23): 1 g via ORAL
  Filled 2016-03-11 (×23): qty 1

## 2016-03-11 MED ORDER — COLLAGENASE 250 UNIT/GM EX OINT
TOPICAL_OINTMENT | Freq: Every day | CUTANEOUS | Status: DC
  Administered 2016-03-11 – 2016-03-16 (×6): via TOPICAL
  Filled 2016-03-11: qty 30

## 2016-03-11 MED ORDER — PANTOPRAZOLE SODIUM 40 MG PO TBEC: 40.0000 mg | DELAYED_RELEASE_TABLET | Freq: Two times a day (BID) | ORAL | Status: DC

## 2016-03-11 MED ORDER — MAGNESIUM OXIDE 400 (241.3 MG) MG PO TABS
400.0000 mg | ORAL_TABLET | Freq: Every day | ORAL | Status: DC
  Administered 2016-03-11 – 2016-03-16 (×6): 400 mg via ORAL
  Filled 2016-03-11 (×6): qty 1

## 2016-03-11 MED ORDER — BISACODYL 5 MG PO TBEC: 5.0000 mg | DELAYED_RELEASE_TABLET | Freq: Every day | ORAL | Status: DC | PRN

## 2016-03-11 MED ORDER — BACLOFEN 10 MG PO TABS
20.0000 mg | ORAL_TABLET | Freq: Four times a day (QID) | ORAL | Status: DC
  Administered 2016-03-11 – 2016-03-16 (×23): 20 mg via ORAL
  Filled 2016-03-11 (×22): qty 2

## 2016-03-11 MED ORDER — SODIUM CHLORIDE 0.9 % IV SOLN
INTRAVENOUS | Status: AC
  Administered 2016-03-11 (×2): via INTRAVENOUS

## 2016-03-11 MED ORDER — ONDANSETRON HCL 4 MG/2ML IJ SOLN: 4.0000 mg | Freq: Four times a day (QID) | INTRAMUSCULAR | Status: DC | PRN

## 2016-03-11 MED ORDER — ACETAMINOPHEN 650 MG RE SUPP: 650.0000 mg | Freq: Four times a day (QID) | RECTAL | Status: DC | PRN

## 2016-03-11 MED ORDER — ACETAMINOPHEN 325 MG PO TABS: 650.0000 mg | ORAL_TABLET | Freq: Four times a day (QID) | ORAL | Status: DC | PRN

## 2016-03-11 MED ORDER — PANTOPRAZOLE SODIUM 40 MG IV SOLR
40.0000 mg | Freq: Two times a day (BID) | INTRAVENOUS | Status: DC
  Administered 2016-03-11 – 2016-03-13 (×6): 40 mg via INTRAVENOUS
  Filled 2016-03-11 (×6): qty 40

## 2016-03-11 MED ORDER — METOCLOPRAMIDE HCL 10 MG PO TABS
10.0000 mg | ORAL_TABLET | Freq: Four times a day (QID) | ORAL | Status: DC
  Administered 2016-03-11 – 2016-03-16 (×20): 10 mg via ORAL
  Filled 2016-03-11 (×6): qty 1
  Filled 2016-03-11: qty 2
  Filled 2016-03-11 (×12): qty 1

## 2016-03-11 MED ORDER — SODIUM CHLORIDE 0.9% FLUSH
10.0000 mL | Freq: Two times a day (BID) | INTRAVENOUS | Status: DC
  Administered 2016-03-11 – 2016-03-14 (×2): 10 mL

## 2016-03-11 NOTE — Progress Notes (Signed)
Pharmacy Antibiotic Note  [name] [name] is a 50 y.o. male admitted on 03/10/2016 with UTI.  Pharmacy has been consulted for Meropenem dosing.  Plan: Add Meropenem 1gm iv q8hr  Weight: 195 lb (88.5 kg)  Temp (24hrs), Avg:100.1 F (37.8 C), Min:98.8 F (37.1 C), Max:101.1 F (38.4 C)   Recent Labs Lab 03/10/16 1930 03/10/16 1939  WBC 9.6  --   CREATININE 0.55*  --   LATICACIDVEN  --  0.52    Estimated Creatinine Clearance: 125.1 mL/min (by C-G formula based on SCr of 0.55 mg/dL (L)).    Allergies  Allergen Reactions  . Ditropan [Oxybutynin] Other (See Comments)    hallucinations  . Feraheme [Ferumoxytol] Other (See Comments)    unknown    Antimicrobials this admission: 10/26 cefepime >> 10/27 10/26 Vanc >> 10/27 Merrem >>  Dose adjustments this admission: -  Microbiology results: pending  Thank you for allowing pharmacy to be a part of this patient's care.  Nani Skillern Crowford 03/11/2016 12:29 AM
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
## 2016-03-11 NOTE — Consult Note (Signed)
[place] Nurse wound consult note Reason for Consult:Stage 4 pressure injury to sacrum, ischium, buttocks and scrotum.  Etiology is pressure and moisture.  SP cath leaks continuously per patient and has contributed to decline in wounds.  Sacrum and ischium are one large wound at this time. Wound care performed with bedside RN at this time.  Scrotum with 3 cm x 2 cm x 0.2 cm denuded skin, moisture Right ischium 2 cm x 2 cm opening with 6.2 cm tunnel. Right upper flank unstageable pressure injury.  Device related from wheelchair, per patient. 100% devitalized tissue to wound bed.  Wound type:pressure and moisture.  Pressure Ulcer POA: Yes to all Measurement:Sacrum 22 cm x 12 cm x 0.2 cm  Wound [id number] pink nongranulating  Foul musty odor.   Drainage (amount, consistency, odor) Creamy thick drainage.  Periwound:Intact.  Scarring from healed lesions to penis, scrotum and buttocks.  Dressing procedure/placement/frequency:Cleanse wound to right upper flank with NS and pat gently dry.  Apply Santyl to wound bed.  Cover with NS moist gauze.  Secure with 4x4 gauze and tape.  Change daily.   Cleanse wounds to sacrum, ischium, scrotum with NS and pat gently dry.  Aquacel Ag Kellie Simmering [phone]) to wound bed.  Fill wound depth with 4x4 gauze.  Cover with ABD pad and tape.  Barrier cream to scrotum to protect from moisture. Change daily.  Will not follow at this time.  Please re-consult if needed.  Domenic Moras RN BSN [place] Pager [phone]

## 2016-03-11 NOTE — Progress Notes (Signed)
Fluid order expired.  Paged MD to inquire if fluids need  to be reordered.  No new orders.

## 2016-03-11 NOTE — Progress Notes (Signed)
Patient seen and evaluated earlier this AM by my associate. Please refer to H and P for details regarding assessment and plan.  Will continue antibiotic regimen.  Gen: pt in nad, alert and awake Cv: s1 and s2 wnl, no rubs Pulm: no wheezes, equal chest rise  Manvir Thorson

## 2016-03-11 NOTE — Progress Notes (Signed)
PHARMACY - PHYSICIAN COMMUNICATION CRITICAL VALUE ALERT - BLOOD CULTURE IDENTIFICATION (BCID)  Results for orders placed or performed during the hospital encounter of 03/10/16  Blood Culture ID Panel (Reflexed) (Collected: 03/10/2016  7:30 PM)  Result Value Ref Range   Enterococcus species NOT DETECTED NOT DETECTED   Listeria monocytogenes NOT DETECTED NOT DETECTED   Staphylococcus species DETECTED (A) NOT DETECTED   Staphylococcus aureus NOT DETECTED NOT DETECTED   Methicillin resistance DETECTED (A) NOT DETECTED   Streptococcus species NOT DETECTED NOT DETECTED   Streptococcus agalactiae NOT DETECTED NOT DETECTED   Streptococcus pneumoniae NOT DETECTED NOT DETECTED   Streptococcus pyogenes NOT DETECTED NOT DETECTED   Acinetobacter baumannii NOT DETECTED NOT DETECTED   Enterobacteriaceae species DETECTED (A) NOT DETECTED   Enterobacter cloacae complex NOT DETECTED NOT DETECTED   Escherichia coli NOT DETECTED NOT DETECTED   Klebsiella oxytoca NOT DETECTED NOT DETECTED   Klebsiella pneumoniae NOT DETECTED NOT DETECTED   Proteus species DETECTED (A) NOT DETECTED   Serratia marcescens NOT DETECTED NOT DETECTED   Carbapenem resistance NOT DETECTED NOT DETECTED   Haemophilus influenzae NOT DETECTED NOT DETECTED   Neisseria meningitidis NOT DETECTED NOT DETECTED   Pseudomonas aeruginosa NOT DETECTED NOT DETECTED   Candida albicans NOT DETECTED NOT DETECTED   Candida glabrata NOT DETECTED NOT DETECTED   Candida krusei NOT DETECTED NOT DETECTED   Candida parapsilosis NOT DETECTED NOT DETECTED   Candida tropicalis NOT DETECTED NOT DETECTED    Name of physician (or Provider) Contacted: Dr. Wendee Beavers  Changes to prescribed antibiotics required: Continue Vancomycin, narrow Merrem to Ceftriaxone 2g IV q24h.  F/u in AM regarding Vancomycin and if staph is contaminant.    Ralene Bathe, PharmD, BCPS 03/11/2016, 6:19 PM  Pager: [phone]

## 2016-03-11 NOTE — Progress Notes (Addendum)
Pt arrival with ED RN  Floy Sabina.  Placed pt on telemetry with continuous pox. Noted pt with R femoral picc TLC infusing with normal saline. Lab for sticks, unsuccessful. Awaiting second lab tech for attempt. Second attempt successful.  IV team nurse to draw labs. Lab with call for critical hemoglobin at 6.9.  Noted orders to transfuse one unit of PRBC. Consent obtained. And blood witnessed with two RN's and transfusion started.  Pt with multiple decubitus ulcers on sacrum and R shoulder/flank. Pt total dependent Upon wound inspection on sacral area dsg fell off, huge wound unstagable with purulent white drainage..jelly like consistency and Malodorous. Awaiting wound care consult. Placed ABD pads and dry paper pad underneath pt. Supra pubic catheter noted slight leakage at insertion point. Urine collection bag with bloody and pinkish color.   LUQ colostomy bag intact, burped bag, stoma noted red/pink in color.   Pt able to make needs known, and requested gingerale drink.   Follow up with dayshift RN, to order H/H 1 hour after blood infusion completion.and to also see if pt needs air mattress after wound care consult.

## 2016-03-11 NOTE — Care Management Note (Signed)
Case Management Note  Patient Details  Name: [name] [name] MRN: [id number] Date of Birth: [birth year]  Subjective/Objective: 50 y/o m admitted w/Complicated UTI. [id number]. From home w/HH nursing-Active w/[facility]-rep Tim aware & will accept back-concerns for Non healing wounds.  Will await [place] nursing, [id number] order.Will need ambulance transportation for home @ d/c-address confirmed on demographic sheet.                  Action/Plan:d/c plan home w/HHC.   Expected Discharge Date:   (unknown)               Expected Discharge Plan:  [place]  In-House Referral:     Discharge planning Services  CM Consult  Post Acute Care Choice:  [place] ([facility] nursing.) Choice offered to:  Patient  DME Arranged:    DME Agency:     HH Arranged:    [place] Agency:     Status of Service:  In process, will continue to follow  If discussed at Long Length of Stay Meetings, dates discussed:    Additional Comments:  Dessa Phi, RN 03/11/2016, 4:13 PM

## 2016-03-11 NOTE — ED Notes (Signed)
Informed RN of need get assistance for blood.

## 2016-03-12 LAB — TYPE AND SCREEN
ABO/RH(D): B POS
Antibody Screen: NEGATIVE
UNIT DIVISION: 0
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
## 2016-03-12 NOTE — Progress Notes (Signed)
Two attempts made to place pt on cpap, but he was not ready.  RT will return later to assist.  RN aware.

## 2016-03-12 NOTE — Progress Notes (Signed)
RT placed patient on CPAP. Sterile water was added to water chamber for humidification. Pt home setting is 19 cmH2O. Patient is tolerating well. RT will continue to monitor .

## 2016-03-12 NOTE — Consult Note (Signed)
[place] Nurse wound consult note Reason for Consult:sacral wound Patient well known to our department.  Seen yesterday for assessment and provision of wound care orders.  Repeat consultation ordered today; patient not seen and order for consultation discontinued. [facility] nursing team will not follow, but will remain available to this patient, the nursing and medical teams.  Please re-consult if needed. Thanks, Maudie Flakes, MSN, RN, [place], Arther Abbott  Pager# [phone]

## 2016-03-13 LAB — URINE CULTURE

## 2016-03-13 LAB — CULTURE, BLOOD (ROUTINE X 2)

## 2016-03-13 MED ORDER — PANTOPRAZOLE SODIUM 40 MG PO TBEC
40.0000 mg | DELAYED_RELEASE_TABLET | Freq: Two times a day (BID) | ORAL | Status: DC
  Administered 2016-03-13 – 2016-03-16 (×6): 40 mg via ORAL
  Filled 2016-03-13 (×6): qty 1
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
## 2016-03-13 NOTE — Progress Notes (Signed)
Pharmacy IV to PO conversion  The patient is receiving Pantoprazole by the intravenous route.  Due to critical nationwide shortage, and based on criteria approved by the Pharmacy and [place], the medication is being converted to the equivalent oral dose form.   No active GI bleeding or impaired absorption  Not s/p esophagectomy  Documented ability to take oral medications for > 24 hr  Plan to continue treatment for at least 1 day  If you have any questions about this conversion, please contact the Pharmacy Department (ext [phone]).  Thank you.  Reuel Boom, PharmD Pager: [phone] 03/13/2016, 10:44 AM

## 2016-03-14 ENCOUNTER — Ambulatory Visit (HOSPITAL_BASED_OUTPATIENT_CLINIC_OR_DEPARTMENT_OTHER): Payer: Medicare Other

## 2016-03-14 LAB — VANCOMYCIN, TROUGH
VANCOMYCIN TR: 25 ug/mL — AB (ref 15–20)
VANCOMYCIN TR: 19 ug/mL (ref 15–20)

## 2016-03-14 LAB — CBC
HCT: 27.2 % — ABNORMAL LOW (ref 39.0–52.0)
HEMOGLOBIN: 8.1 g/dL — AB (ref 13.0–17.0)
MCH: 24 pg — ABNORMAL LOW (ref 26.0–34.0)
MCHC: 29.8 g/dL — AB (ref 30.0–36.0)
MCV: 80.7 fL (ref 78.0–100.0)
PLATELETS: 393 10*3/uL (ref 150–400)
RBC: 3.37 MIL/uL — AB (ref 4.22–5.81)
RDW: 16.1 % — ABNORMAL HIGH (ref 11.5–15.5)
WBC: 6.5 10*3/uL (ref 4.0–10.5)

## 2016-03-14 LAB — BASIC METABOLIC PANEL
Anion gap: 8 (ref 5–15)
BUN: 11 mg/dL (ref 6–20)
CHLORIDE: 109 mmol/L (ref 101–111)
CO2: 25 mmol/L (ref 22–32)
CREATININE: 0.51 mg/dL — AB (ref 0.61–1.24)
Calcium: 8.1 mg/dL — ABNORMAL LOW (ref 8.9–10.3)
Glucose, Bld: 115 mg/dL — ABNORMAL HIGH (ref 65–99)
Potassium: 2.6 mmol/L — CL (ref 3.5–5.1)
SODIUM: 142 mmol/L (ref 135–145)

## 2016-03-14 MED ORDER — POTASSIUM CHLORIDE 10 MEQ/100ML IV SOLN
10.0000 meq | INTRAVENOUS | Status: AC
  Administered 2016-03-14 (×3): 10 meq via INTRAVENOUS
  Filled 2016-03-14 (×3): qty 100

## 2016-03-14 MED ORDER — POTASSIUM CHLORIDE CRYS ER 20 MEQ PO TBCR
40.0000 meq | EXTENDED_RELEASE_TABLET | Freq: Once | ORAL | Status: AC
  Administered 2016-03-14: 40 meq via ORAL
  Filled 2016-03-14: qty 2
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
## 2016-03-14 NOTE — Progress Notes (Signed)
Pharmacy Note  Pt was scheduled to have a vancomycin trough drawn this AM. The trough level was 25 mcg/ml. However, the level was drawn after the AM dose was started and the level that was drawn may not indicate a true vancomycin trough Therefore, will schedule another trough this evening to determine what the actual trough level is.   Royetta Asal, PharmD, BCPS Pager [phone] 03/14/2016 11:17 AM

## 2016-03-14 NOTE — Care Management Note (Signed)
Case Management Note  Patient Details  Name: IOSEFA THIBODEAU MRN: [id number] Date of Birth: 28-Oct-1965  Subjective/Objective:   [facility]-rep [place]-cannot accept.[facility] rep [facility] will check & screen if able to accept.                 Action/Plan:   Expected Discharge Date:   (unknown)               Expected Discharge Plan:  [place]  In-House Referral:     Discharge planning Services  CM Consult  Post Acute Care Choice:  [place] ([facility] nursing[facility] Choice offered to:  Patient  DME Arranged:    DME Agency:     HH Arranged:    [place] Agency:     Status of Service:  In process, will continue to follow  If discussed at Long Length of Stay Meetings, dates discussed:    Additional Comments:  Dessa Phi, RN 03/14/2016, 1:41 PM
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
## 2016-03-14 NOTE — Care Management Important Message (Signed)
Important Message  Patient Details IM Letter given to [name]/Case Manager to present to Patient. Name: [name] [name] MRN: [id number] Date of Birth: 1966-04-23   Medicare Important Message Given:  Yes    [name] 03/14/2016, 11:11 AMImportant Message  Patient Details  Name: [name] [name] MRN: [id number] Date of Birth: 04/04/1966   Medicare Important Message Given:  Yes    [name] 03/14/2016, 11:11 AM

## 2016-03-14 NOTE — Care Management Note (Signed)
Case Management Note  Patient Details  Name: BYAN HISER MRN: [id number] Date of Birth: 01-21-1966  Subjective/Objective: Spoke to patient about d/c plan-[facility] will not be able to accept back. Patient has been recc for LTAC in past-not covered, he has been recc-SNF in past-he declines going to SNF. Patient states he will talk to family about helping him w/wound care. Will also check with other [facility] agencies if can accept. [facility] has declined to accept back.                   Action/Plan:d/c plan home.   Expected Discharge Date:   (unknown)               Expected Discharge Plan:  [place]  In-House Referral:     Discharge planning Services  CM Consult  Post Acute Care Choice:  [place] ([facility] nursing.) Choice offered to:  Patient  DME Arranged:    DME Agency:     HH Arranged:    [place] Agency:     Status of Service:  In process, will continue to follow  If discussed at Long Length of Stay Meetings, dates discussed:    Additional Comments:  Dessa Phi, RN 03/14/2016, 1:11 PM

## 2016-03-14 NOTE — Care Management Note (Signed)
Case Management Note  Patient Details  Name: [name] [name] MRN: [id number] Date of Birth: [birth year]  Subjective/Objective:   TC [facility] rep Edwinna-will look @ case & call me back if can accept.                 Action/Plan:   Expected Discharge Date:   (unknown)               Expected Discharge Plan:  [place]  In-House Referral:     Discharge planning Services  CM Consult  Post Acute Care Choice:  [place] ([facility] nursing.) Choice offered to:  Patient  DME Arranged:    DME Agency:     HH Arranged:    [place] Agency:     Status of Service:  In process, will continue to follow  If discussed at Long Length of Stay Meetings, dates discussed:    Additional Comments:  Dessa Phi, RN 03/14/2016, 1:26 PM

## 2016-03-14 NOTE — Care Management Note (Signed)
Case Management Note  Patient Details  Name: [name] [name] MRN: [id number] Date of Birth: [birth year]  Subjective/Objective: [name] able to accept-recc HHRN/OT/HH social worker, f1f-await orders.                   Action/Plan:d/c plan home w/HHC.   Expected Discharge Date:   (unknown)               Expected Discharge Plan:  [name]  In-House Referral:     Discharge planning Services  CM Consult  Post Acute Care Choice:  [name] ([facility] nursing.) Choice offered to:  Patient  DME Arranged:    DME Agency:     HH Arranged:    [name] Agency:     Status of Service:  In process, will continue to follow  If discussed at Long Length of Stay Meetings, dates discussed:    Additional Comments:  Dessa Phi, RN 03/14/2016, 3:49 PM

## 2016-03-14 NOTE — Progress Notes (Addendum)
PHARMACIST - PHYSICIAN COMMUNICATION CONCERNING:  Vancomycin   74 yoM with quadriplegia and suprapubic cath on IV vancomycin and CTX for bacteremia and UTI.    Vancomycin trough checked tonight is therapeutic @ 19 mcg/ml on regimen Vanc 1g IV q12h  Goal trough 15-20 mcg/ml  RECOMMENDATION: Continue current Vancomycin regimen. Continue to monitor renal function and VT as warranted.    Ralene Bathe, PharmD, BCPS 03/14/2016, 9:58 PM  Pager: [phone]

## 2016-03-14 NOTE — Progress Notes (Signed)
CRITICAL VALUE ALERT  Critical value received:  Potassium 2.6  Date of notification: 03/14/2016  Time of notification:  0400  Critical value read back:Yes.    Nurse who received alert:  S. Danielle Dess   MD notified (1st page):  Schorr  Time of first page:  0400  MD notified (2nd page):  Time of second page:  Responding MD:  Schorr  Time MD responded:  [phone]

## 2016-03-15 DIAGNOSIS — Z803 Family history of malignant neoplasm of breast: Secondary | ICD-10-CM

## 2016-03-15 DIAGNOSIS — Z888 Allergy status to other drugs, medicaments and biological substances status: Secondary | ICD-10-CM

## 2016-03-15 DIAGNOSIS — G8250 Quadriplegia, unspecified: Secondary | ICD-10-CM

## 2016-03-15 DIAGNOSIS — L89304 Pressure ulcer of unspecified buttock, stage 4: Secondary | ICD-10-CM

## 2016-03-15 DIAGNOSIS — N390 Urinary tract infection, site not specified: Secondary | ICD-10-CM

## 2016-03-15 DIAGNOSIS — Z960 Presence of urogenital implants: Secondary | ICD-10-CM

## 2016-03-15 DIAGNOSIS — Z833 Family history of diabetes mellitus: Secondary | ICD-10-CM

## 2016-03-15 DIAGNOSIS — B957 Other staphylococcus as the cause of diseases classified elsewhere: Secondary | ICD-10-CM

## 2016-03-15 DIAGNOSIS — L89894 Pressure ulcer of other site, stage 4: Secondary | ICD-10-CM

## 2016-03-15 DIAGNOSIS — L89154 Pressure ulcer of sacral region, stage 4: Secondary | ICD-10-CM

## 2016-03-15 DIAGNOSIS — B964 Proteus (mirabilis) (morganii) as the cause of diseases classified elsewhere: Secondary | ICD-10-CM

## 2016-03-15 DIAGNOSIS — B9562 Methicillin resistant Staphylococcus aureus infection as the cause of diseases classified elsewhere: Secondary | ICD-10-CM

## 2016-03-15 LAB — MAGNESIUM: Magnesium: 1.7 mg/dL (ref 1.7–2.4)

## 2016-03-15 MED ORDER — BOOST PLUS PO LIQD
237.0000 mL | Freq: Two times a day (BID) | ORAL | Status: DC
  Administered 2016-03-15 – 2016-03-16 (×4): 237 mL via ORAL
  Filled 2016-03-15 (×4): qty 237
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
## 2016-03-15 NOTE — Progress Notes (Signed)
I assumed care of this patient at 1700.  I agree with the previous nurses assessment.

## 2016-03-16 DIAGNOSIS — L89103 Pressure ulcer of unspecified part of back, stage 3: Secondary | ICD-10-CM

## 2016-03-16 DIAGNOSIS — I10 Essential (primary) hypertension: Secondary | ICD-10-CM

## 2016-03-16 LAB — BASIC METABOLIC PANEL
ANION GAP: 7 (ref 5–15)
ANION GAP: 6 (ref 5–15)
BUN: 14 mg/dL (ref 6–20)
BUN: 15 mg/dL (ref 6–20)
CHLORIDE: 108 mmol/L (ref 101–111)
CHLORIDE: 109 mmol/L (ref 101–111)
CO2: 28 mmol/L (ref 22–32)
CO2: 26 mmol/L (ref 22–32)
CREATININE: 0.35 mg/dL — AB (ref 0.61–1.24)
Calcium: 8.3 mg/dL — ABNORMAL LOW (ref 8.9–10.3)
Calcium: 8.3 mg/dL — ABNORMAL LOW (ref 8.9–10.3)
Creatinine, Ser: 0.38 mg/dL — ABNORMAL LOW (ref 0.61–1.24)
GFR calc Af Amer: >60 mL/min (ref >60)
GFR calc non Af Amer: >60 mL/min (ref >60)
GLUCOSE: 124 mg/dL — AB (ref 65–99)
Glucose, Bld: 106 mg/dL — ABNORMAL HIGH (ref 65–99)
POTASSIUM: 2.6 mmol/L — AB (ref 3.5–5.1)
Potassium: 3.5 mmol/L (ref 3.5–5.1)
SODIUM: 141 mmol/L (ref 135–145)
Sodium: 143 mmol/L (ref 135–145)

## 2016-03-16 LAB — CULTURE, BLOOD (ROUTINE X 2): CULTURE: NO GROWTH

## 2016-03-16 LAB — MAGNESIUM: Magnesium: 1.6 mg/dL — ABNORMAL LOW (ref 1.7–2.4)

## 2016-03-16 MED ORDER — MAGNESIUM SULFATE 2 GM/50ML IV SOLN
2.0000 g | Freq: Once | INTRAVENOUS | Status: AC
  Administered 2016-03-16: 2 g via INTRAVENOUS
  Filled 2016-03-16: qty 50

## 2016-03-16 MED ORDER — MAGNESIUM SULFATE BOLUS VIA INFUSION: 2.0000 g | Freq: Once | INTRAVENOUS | Status: DC

## 2016-03-16 MED ORDER — POTASSIUM CHLORIDE 10 MEQ/100ML IV SOLN
10.0000 meq | INTRAVENOUS | Status: AC
  Administered 2016-03-16 (×3): 10 meq via INTRAVENOUS
  Filled 2016-03-16 (×3): qty 100

## 2016-03-16 MED ORDER — LINEZOLID 600 MG PO TABS: 600.0000 mg | ORAL_TABLET | Freq: Two times a day (BID) | ORAL | 0 refills | Status: AC

## 2016-03-16 MED ORDER — POTASSIUM CHLORIDE CRYS ER 20 MEQ PO TBCR
40.0000 meq | EXTENDED_RELEASE_TABLET | ORAL | Status: AC
  Administered 2016-03-16 (×2): 40 meq via ORAL
  Filled 2016-03-16 (×2): qty 2
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
## 2016-03-16 NOTE — Care Management Note (Signed)
Case Management Note  Patient Details  Name: [name] [name] MRN: [id number] Date of Birth: Dec 24, 1965  Subjective/Objective: HHRN/OT/social work, & f76f orders placed-[facility] rep aware of possible d/c, & HHC orders. Ambulance transport form on shadow chart for d/c today, address confirmed-Nsg aware. Await d/c order.                  Action/Plan:d/c home w/HHC.   Expected Discharge Date:   (unknown)               Expected Discharge Plan:  [place]  In-House Referral:     Discharge planning Services  CM Consult  Post Acute Care Choice:  [place] ([facility] nursing.) Choice offered to:  Patient  DME Arranged:    DME Agency:     HH Arranged:  RN, OT, Social Work [place] Agency:  [facility]  Status of Service:  Completed, signed off  If discussed at [employer], dates discussed:    Additional Comments:  Dessa Phi, RN 03/16/2016, 11:59 AM

## 2016-03-17 DIAGNOSIS — S12400S Unspecified displaced fracture of fifth cervical vertebra, sequela: Secondary | ICD-10-CM | POA: Diagnosis not present

## 2016-03-17 DIAGNOSIS — I739 Peripheral vascular disease, unspecified: Secondary | ICD-10-CM | POA: Diagnosis not present

## 2016-03-17 DIAGNOSIS — B9562 Methicillin resistant Staphylococcus aureus infection as the cause of diseases classified elsewhere: Secondary | ICD-10-CM | POA: Diagnosis not present

## 2016-03-17 DIAGNOSIS — B9689 Other specified bacterial agents as the cause of diseases classified elsewhere: Secondary | ICD-10-CM | POA: Diagnosis not present

## 2016-03-17 DIAGNOSIS — L89154 Pressure ulcer of sacral region, stage 4: Secondary | ICD-10-CM | POA: Diagnosis not present

## 2016-03-17 DIAGNOSIS — J9610 Chronic respiratory failure, unspecified whether with hypoxia or hypercapnia: Secondary | ICD-10-CM | POA: Diagnosis not present

## 2016-03-17 DIAGNOSIS — G8250 Quadriplegia, unspecified: Secondary | ICD-10-CM | POA: Diagnosis not present

## 2016-03-17 DIAGNOSIS — T83511A Infection and inflammatory reaction due to indwelling urethral catheter, initial encounter: Secondary | ICD-10-CM | POA: Diagnosis not present

## 2016-03-17 DIAGNOSIS — I10 Essential (primary) hypertension: Secondary | ICD-10-CM | POA: Diagnosis not present

## 2016-03-17 DIAGNOSIS — N390 Urinary tract infection, site not specified: Secondary | ICD-10-CM | POA: Diagnosis not present

## 2016-03-18 ENCOUNTER — Telehealth: Payer: Self-pay | Admitting: *Deleted

## 2016-03-18 NOTE — Telephone Encounter (Signed)
Patient called and [name] [name] he was in the hospital recently and while in was prescribed Zyvox. He advised it needs a prior Auth. Looked at the Rx and our doctor did not write for this nor have we received notification that it needs a PA. Advised the patient of this and he advised he is not able to get in touch with the doctor that prescribed the medicine asked if Dr [name] [name] could do it for him. Advised him will ask the doctor and get back with him once he responds. Also advised him PA's are not instant and it may take 24-48 hours to get approved.

## 2016-03-21 ENCOUNTER — Encounter (HOSPITAL_BASED_OUTPATIENT_CLINIC_OR_DEPARTMENT_OTHER): Payer: Medicare Other | Attending: Internal Medicine

## 2016-03-21 DIAGNOSIS — I10 Essential (primary) hypertension: Secondary | ICD-10-CM | POA: Insufficient documentation

## 2016-03-21 DIAGNOSIS — G40909 Epilepsy, unspecified, not intractable, without status epilepticus: Secondary | ICD-10-CM | POA: Insufficient documentation

## 2016-03-21 DIAGNOSIS — L89103 Pressure ulcer of unspecified part of back, stage 3: Secondary | ICD-10-CM | POA: Diagnosis not present

## 2016-03-21 DIAGNOSIS — L89153 Pressure ulcer of sacral region, stage 3: Secondary | ICD-10-CM | POA: Insufficient documentation

## 2016-03-21 DIAGNOSIS — G4730 Sleep apnea, unspecified: Secondary | ICD-10-CM | POA: Diagnosis not present

## 2016-03-21 DIAGNOSIS — G8250 Quadriplegia, unspecified: Secondary | ICD-10-CM | POA: Insufficient documentation

## 2016-03-21 DIAGNOSIS — L89154 Pressure ulcer of sacral region, stage 4: Secondary | ICD-10-CM | POA: Insufficient documentation

## 2016-03-21 DIAGNOSIS — D649 Anemia, unspecified: Secondary | ICD-10-CM | POA: Diagnosis not present

## 2016-03-21 NOTE — Telephone Encounter (Signed)
Doxy 100mg  po bid for 7 days Thanks

## 2016-03-21 NOTE — Telephone Encounter (Signed)
I would give him a week of doxy It will not get into his urine as well as bactrim (which his bug is resistant to) or linezolid (which is expensive) but it should work and be less expensive.  thanks

## 2016-03-22 ENCOUNTER — Ambulatory Visit: Payer: Medicare Other

## 2016-03-22 ENCOUNTER — Telehealth: Payer: Self-pay | Admitting: Hematology

## 2016-03-22 ENCOUNTER — Telehealth: Payer: Self-pay

## 2016-03-22 ENCOUNTER — Other Ambulatory Visit: Payer: Self-pay | Admitting: *Deleted

## 2016-03-22 ENCOUNTER — Other Ambulatory Visit: Payer: Medicare Other

## 2016-03-22 ENCOUNTER — Ambulatory Visit: Payer: Medicare Other | Admitting: Hematology

## 2016-03-22 MED ORDER — DOXYCYCLINE HYCLATE 100 MG PO TABS: 100.0000 mg | ORAL_TABLET | Freq: Two times a day (BID) | ORAL | 0 refills | Status: DC

## 2016-03-22 NOTE — Telephone Encounter (Signed)
Patient was able to get the linezolid and has been taking it.   Is there a reason to start the doxycyline?     Laverle Patter, RN
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
## 2016-03-22 NOTE — Telephone Encounter (Signed)
Called the patient and had to leave a message for him about new antibiotic. Explained change and that it has been sent to the pharmacy and to call the office if he has any questions.

## 2016-03-22 NOTE — Telephone Encounter (Signed)
I was called and told that the pt could not afford zyvox.  If he can afford it, there is no reason for doxy.  thanks

## 2016-03-22 NOTE — Telephone Encounter (Signed)
Message left for patient.  He does not doxycycline. Continue Zyvox per Dr [name].   Laverle Patter, RN

## 2016-03-22 NOTE — Telephone Encounter (Signed)
Done

## 2016-03-22 NOTE — Telephone Encounter (Signed)
11/7 Appointments rescheduled per patient request. The patient stated that he was not feeling well enough to come today. Patient requested to have lab and injection scheduled soon and MD appointment later.

## 2016-03-25 ENCOUNTER — Other Ambulatory Visit (HOSPITAL_BASED_OUTPATIENT_CLINIC_OR_DEPARTMENT_OTHER): Payer: Medicare Other

## 2016-03-25 ENCOUNTER — Ambulatory Visit (HOSPITAL_BASED_OUTPATIENT_CLINIC_OR_DEPARTMENT_OTHER): Payer: Medicare Other

## 2016-03-25 VITALS — BP 124/78 | HR 87 | Temp 98.0°F | Resp 20

## 2016-03-25 DIAGNOSIS — D638 Anemia in other chronic diseases classified elsewhere: Secondary | ICD-10-CM

## 2016-03-25 DIAGNOSIS — D649 Anemia, unspecified: Secondary | ICD-10-CM

## 2016-03-25 DIAGNOSIS — D509 Iron deficiency anemia, unspecified: Secondary | ICD-10-CM

## 2016-03-25 DIAGNOSIS — M869 Osteomyelitis, unspecified: Secondary | ICD-10-CM

## 2016-03-25 LAB — CBC WITH DIFFERENTIAL/PLATELET
BASO%: 1.1 % (ref 0.0–2.0)
Basophils Absolute: 0.1 10*3/uL (ref 0.0–0.1)
EOS ABS: 0.4 10*3/uL (ref 0.0–0.5)
EOS%: 6 % (ref 0.0–7.0)
HCT: 32.6 % — ABNORMAL LOW (ref 38.4–49.9)
HGB: 10 g/dL — ABNORMAL LOW (ref 13.0–17.1)
LYMPH%: 23.4 % (ref 14.0–49.0)
MCH: 24 pg — AB (ref 27.2–33.4)
MCHC: 30.6 g/dL — AB (ref 32.0–36.0)
MCV: 78.4 fL — AB (ref 79.3–98.0)
MONO#: 0.7 10*3/uL (ref 0.1–0.9)
MONO%: 10.8 % (ref 0.0–14.0)
NEUT%: 58.7 % (ref 39.0–75.0)
NEUTROS ABS: 3.9 10*3/uL (ref 1.5–6.5)
PLATELETS: 421 10*3/uL — AB (ref 140–400)
RBC: 4.16 10*6/uL — AB (ref 4.20–5.82)
RDW: 18.3 % — ABNORMAL HIGH (ref 11.0–14.6)
WBC: 6.7 10*3/uL (ref 4.0–10.3)
lymph#: 1.6 10*3/uL (ref 0.9–3.3)

## 2016-03-25 LAB — IRON AND TIBC
%SAT: 13 % — ABNORMAL LOW (ref 20–55)
Iron: 26 ug/dL — ABNORMAL LOW (ref 42–163)
TIBC: 191 ug/dL — ABNORMAL LOW (ref 202–409)
UIBC: 166 ug/dL (ref 117–376)

## 2016-03-25 LAB — FERRITIN: FERRITIN: 719 ng/mL — AB (ref 22–316)

## 2016-03-25 MED ORDER — DARBEPOETIN ALFA 100 MCG/0.5ML IJ SOSY
100.0000 ug | PREFILLED_SYRINGE | Freq: Once | INTRAMUSCULAR | Status: AC
  Administered 2016-03-25: 100 ug via SUBCUTANEOUS
  Filled 2016-03-25: qty 0.5

## 2016-03-25 NOTE — Patient Instructions (Signed)
Darbepoetin Alfa injection What is this medicine? DARBEPOETIN ALFA (dar be POE e tin AL fa) helps your body make more red blood cells. It is used to treat anemia caused by chronic kidney failure and chemotherapy. This medicine may be used for other purposes; ask your health care provider or pharmacist if you have questions. What should I tell my health care provider before I take this medicine? They need to know if you have any of these conditions: -blood clotting disorders or history of blood clots -cancer patient not on chemotherapy -cystic fibrosis -heart disease, such as angina, heart failure, or a history of a heart attack -hemoglobin level of 12 g/dL or greater -high blood pressure -low levels of folate, iron, or vitamin B12 -seizures -an unusual or allergic reaction to darbepoetin, erythropoietin, albumin, hamster proteins, latex, other medicines, foods, dyes, or preservatives -pregnant or trying to get pregnant -breast-feeding How should I use this medicine? This medicine is for injection into a vein or under the skin. It is usually given by a health care professional in a hospital or clinic setting. If you get this medicine at home, you will be taught how to prepare and give this medicine. Do not shake the solution before you withdraw a dose. Use exactly as directed. Take your medicine at regular intervals. Do not take your medicine more often than directed. It is important that you put your used needles and syringes in a special sharps container. Do not put them in a trash can. If you do not have a sharps container, call your pharmacist or healthcare provider to get one. Talk to your pediatrician regarding the use of this medicine in children. While this medicine may be used in children as young as 1 year for selected conditions, precautions do apply. Overdosage: If you think you have taken too much of this medicine contact a poison control center or emergency room at once. NOTE:  This medicine is only for you. Do not share this medicine with others. What if I miss a dose? If you miss a dose, take it as soon as you can. If it is almost time for your next dose, take only that dose. Do not take double or extra doses. What may interact with this medicine? Do not take this medicine with any of the following medications: -epoetin alfa This list may not describe all possible interactions. Give your health care provider a list of all the medicines, herbs, non-prescription drugs, or dietary supplements you use. Also tell them if you smoke, drink alcohol, or use illegal drugs. Some items may interact with your medicine. What should I watch for while using this medicine? Visit your prescriber or health care professional for regular checks on your progress and for the needed blood tests and blood pressure measurements. It is especially important for the doctor to make sure your hemoglobin level is in the desired range, to limit the risk of potential side effects and to give you the best benefit. Keep all appointments for any recommended tests. Check your blood pressure as directed. Ask your doctor what your blood pressure should be and when you should contact him or her. As your body makes more red blood cells, you may need to take iron, folic acid, or vitamin B supplements. Ask your doctor or health care provider which products are right for you. If you have kidney disease continue dietary restrictions, even though this medication can make you feel better. Talk with your doctor or health care professional about the   foods you eat and the vitamins that you take. What side effects may I notice from receiving this medicine? Side effects that you should report to your doctor or health care professional as soon as possible: -allergic reactions like skin rash, itching or hives, swelling of the face, lips, or tongue -breathing problems -changes in vision -chest pain -confusion, trouble speaking  or understanding -feeling faint or lightheaded, falls -high blood pressure -muscle aches or pains -pain, swelling, warmth in the leg -rapid weight gain -severe headaches -sudden numbness or weakness of the face, arm or leg -trouble walking, dizziness, loss of balance or coordination -seizures (convulsions) -swelling of the ankles, feet, hands -unusually weak or tired Side effects that usually do not require medical attention (report to your doctor or health care professional if they continue or are bothersome): -diarrhea -fever, chills (flu-like symptoms) -headaches -nausea, vomiting -redness, stinging, or swelling at site where injected This list may not describe all possible side effects. Call your doctor for medical advice about side effects. You may report side effects to FDA at 1-800-FDA-1088. Where should I keep my medicine? Keep out of the reach of children. Store in a refrigerator between 2 and 8 degrees C (36 and 46 degrees F). Do not freeze. Do not shake. Throw away any unused portion if using a single-dose vial. Throw away any unused medicine after the expiration date. NOTE: This sheet is a summary. It may not cover all possible information. If you have questions about this medicine, talk to your doctor, pharmacist, or health care provider.    2016, [employer]. (2008-04-15 10:23:57)  

## 2016-03-27 DIAGNOSIS — L89154 Pressure ulcer of sacral region, stage 4: Secondary | ICD-10-CM | POA: Diagnosis not present

## 2016-03-27 DIAGNOSIS — Z7409 Other reduced mobility: Secondary | ICD-10-CM | POA: Diagnosis not present

## 2016-03-28 DIAGNOSIS — S31809A Unspecified open wound of unspecified buttock, initial encounter: Secondary | ICD-10-CM | POA: Diagnosis not present

## 2016-03-28 DIAGNOSIS — L8944 Pressure ulcer of contiguous site of back, buttock and hip, stage 4: Secondary | ICD-10-CM | POA: Diagnosis not present

## 2016-03-28 DIAGNOSIS — Z933 Colostomy status: Secondary | ICD-10-CM | POA: Diagnosis not present

## 2016-03-28 DIAGNOSIS — S31102A Unspecified open wound of abdominal wall, epigastric region without penetration into peritoneal cavity, initial encounter: Secondary | ICD-10-CM | POA: Diagnosis not present

## 2016-03-29 DIAGNOSIS — Z933 Colostomy status: Secondary | ICD-10-CM | POA: Diagnosis not present

## 2016-03-29 DIAGNOSIS — S31102A Unspecified open wound of abdominal wall, epigastric region without penetration into peritoneal cavity, initial encounter: Secondary | ICD-10-CM | POA: Diagnosis not present

## 2016-03-29 DIAGNOSIS — L8944 Pressure ulcer of contiguous site of back, buttock and hip, stage 4: Secondary | ICD-10-CM | POA: Diagnosis not present

## 2016-03-29 DIAGNOSIS — S31809A Unspecified open wound of unspecified buttock, initial encounter: Secondary | ICD-10-CM | POA: Diagnosis not present

## 2016-04-03 IMAGING — XA IR REMOVAL TUNNELED CV CATH
1 series · 2 of 2 positions shown · non-contrast
Comparison: none

INDICATION: No longer in need of central venous access

[Series 1: run · 2 of 2 slices shown]
[im 1/2]
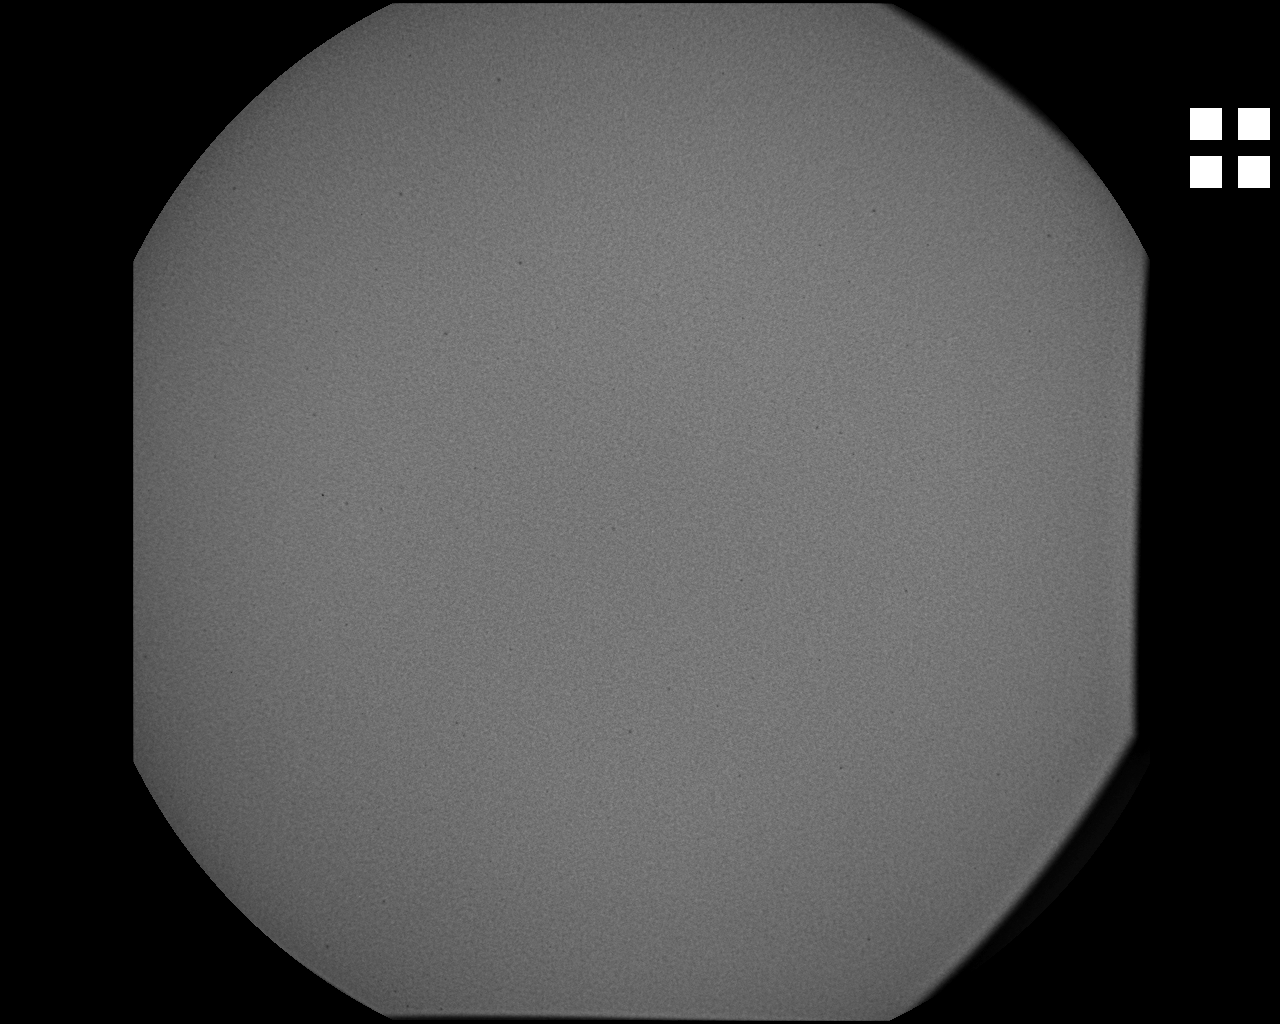
[im 2/2]
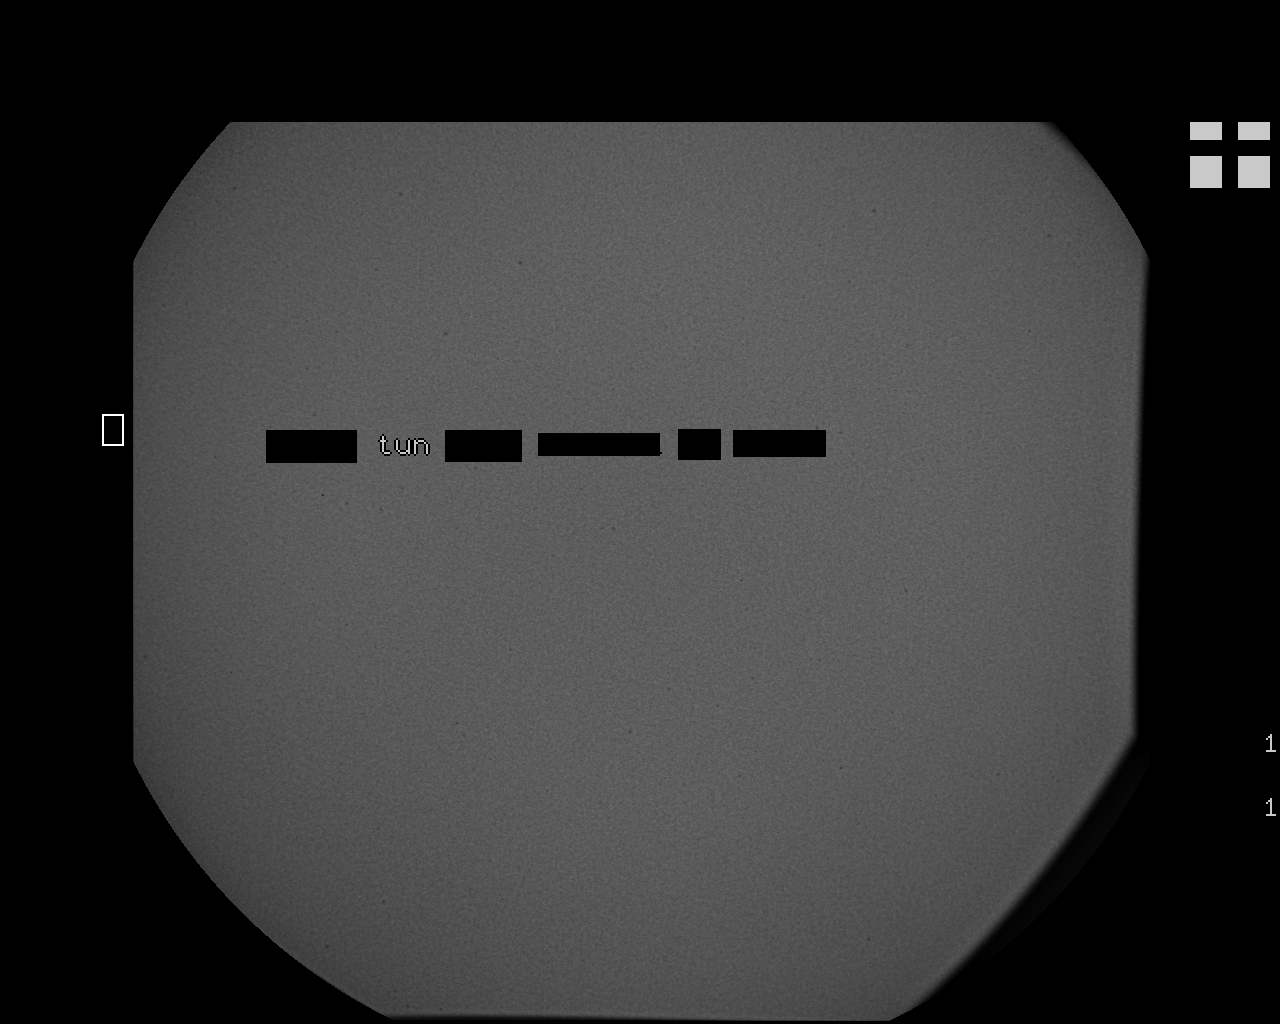

[2 of 2 positions shown; findings below may reference images not displayed]

EXAM:
REMOVAL OF TUNNELED PICC

MEDICATIONS:
None

COMPLICATIONS:
None immediate

PROCEDURE:
Informed written consent was obtained from the patient following an
explanation of the procedure, risks, benefits and alternatives to
treatment. A time out was performed prior to the initiation of the
procedure.

Maximal barrier sterile technique was utilized including caps, mask,
sterile gowns, sterile gloves, large sterile drape, hand hygiene,
and Betadine.

1% lidocaine with epinephrine was injected under sterile conditions
along the subcutaneous tunnel. Utilizing a combination of blunt
dissection and gentle traction, the catheter was removed intact.
Hemostasis was obtained with manual compression. A dressing was
placed. The patient tolerated the procedure well without immediate
post procedural complication.
IMPRESSION: Successful removal of tunneled PICC.

## 2016-04-05 DIAGNOSIS — L8944 Pressure ulcer of contiguous site of back, buttock and hip, stage 4: Secondary | ICD-10-CM | POA: Diagnosis not present

## 2016-04-05 DIAGNOSIS — Z933 Colostomy status: Secondary | ICD-10-CM | POA: Diagnosis not present

## 2016-04-05 DIAGNOSIS — S31102A Unspecified open wound of abdominal wall, epigastric region without penetration into peritoneal cavity, initial encounter: Secondary | ICD-10-CM | POA: Diagnosis not present

## 2016-04-05 DIAGNOSIS — S31809A Unspecified open wound of unspecified buttock, initial encounter: Secondary | ICD-10-CM | POA: Diagnosis not present

## 2016-04-05 DIAGNOSIS — N390 Urinary tract infection, site not specified: Secondary | ICD-10-CM | POA: Diagnosis not present

## 2016-04-06 DIAGNOSIS — L89154 Pressure ulcer of sacral region, stage 4: Secondary | ICD-10-CM | POA: Diagnosis not present

## 2016-04-08 DIAGNOSIS — S31102A Unspecified open wound of abdominal wall, epigastric region without penetration into peritoneal cavity, initial encounter: Secondary | ICD-10-CM | POA: Diagnosis not present

## 2016-04-08 DIAGNOSIS — S31809A Unspecified open wound of unspecified buttock, initial encounter: Secondary | ICD-10-CM | POA: Diagnosis not present

## 2016-04-08 DIAGNOSIS — L8944 Pressure ulcer of contiguous site of back, buttock and hip, stage 4: Secondary | ICD-10-CM | POA: Diagnosis not present

## 2016-04-08 DIAGNOSIS — Z933 Colostomy status: Secondary | ICD-10-CM | POA: Diagnosis not present

## 2016-04-11 DIAGNOSIS — L89154 Pressure ulcer of sacral region, stage 4: Secondary | ICD-10-CM | POA: Diagnosis not present

## 2016-04-11 DIAGNOSIS — L89103 Pressure ulcer of unspecified part of back, stage 3: Secondary | ICD-10-CM | POA: Diagnosis not present

## 2016-04-13 ENCOUNTER — Ambulatory Visit (HOSPITAL_BASED_OUTPATIENT_CLINIC_OR_DEPARTMENT_OTHER): Payer: Medicare Other | Admitting: Hematology

## 2016-04-13 ENCOUNTER — Telehealth: Payer: Self-pay | Admitting: Hematology

## 2016-04-13 VITALS — BP 126/88 | HR 88 | Temp 98.5°F | Resp 17 | Ht 70.0 in | Wt 203.0 lb

## 2016-04-13 DIAGNOSIS — D638 Anemia in other chronic diseases classified elsewhere: Secondary | ICD-10-CM

## 2016-04-13 DIAGNOSIS — D473 Essential (hemorrhagic) thrombocythemia: Secondary | ICD-10-CM

## 2016-04-13 DIAGNOSIS — D509 Iron deficiency anemia, unspecified: Secondary | ICD-10-CM

## 2016-04-13 DIAGNOSIS — M869 Osteomyelitis, unspecified: Secondary | ICD-10-CM | POA: Diagnosis not present

## 2016-04-13 NOTE — Telephone Encounter (Signed)
Appointments scheduled per 04/13/16 los. A copy of the AVS report and appointment schedule was given to the patient, per 04/13/16 los. °
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
## 2016-04-14 DIAGNOSIS — L8944 Pressure ulcer of contiguous site of back, buttock and hip, stage 4: Secondary | ICD-10-CM | POA: Diagnosis not present

## 2016-04-14 DIAGNOSIS — S31102A Unspecified open wound of abdominal wall, epigastric region without penetration into peritoneal cavity, initial encounter: Secondary | ICD-10-CM | POA: Diagnosis not present

## 2016-04-14 DIAGNOSIS — S31809A Unspecified open wound of unspecified buttock, initial encounter: Secondary | ICD-10-CM | POA: Diagnosis not present

## 2016-04-14 DIAGNOSIS — Z933 Colostomy status: Secondary | ICD-10-CM | POA: Diagnosis not present

## 2016-04-21 ENCOUNTER — Telehealth: Payer: Self-pay | Admitting: Hematology

## 2016-04-21 NOTE — Telephone Encounter (Signed)
Appointment rescheduled per patient request. Patient requested to come on 12/14.

## 2016-04-22 ENCOUNTER — Ambulatory Visit: Payer: Medicare Other

## 2016-04-22 ENCOUNTER — Other Ambulatory Visit: Payer: Medicare Other

## 2016-04-26 DIAGNOSIS — L8944 Pressure ulcer of contiguous site of back, buttock and hip, stage 4: Secondary | ICD-10-CM | POA: Diagnosis not present

## 2016-04-26 DIAGNOSIS — L89154 Pressure ulcer of sacral region, stage 4: Secondary | ICD-10-CM | POA: Diagnosis not present

## 2016-04-26 DIAGNOSIS — S31102A Unspecified open wound of abdominal wall, epigastric region without penetration into peritoneal cavity, initial encounter: Secondary | ICD-10-CM | POA: Diagnosis not present

## 2016-04-26 DIAGNOSIS — Z933 Colostomy status: Secondary | ICD-10-CM | POA: Diagnosis not present

## 2016-04-26 DIAGNOSIS — Z7409 Other reduced mobility: Secondary | ICD-10-CM | POA: Diagnosis not present

## 2016-04-26 DIAGNOSIS — S31809A Unspecified open wound of unspecified buttock, initial encounter: Secondary | ICD-10-CM | POA: Diagnosis not present

## 2016-04-27 ENCOUNTER — Telehealth: Payer: Self-pay | Admitting: Hematology

## 2016-04-27 NOTE — Telephone Encounter (Signed)
12/14 Appointments rescheduled per patient request. Patient requested to have appointments rescheduled to Friday 12/15.

## 2016-04-28 ENCOUNTER — Other Ambulatory Visit: Payer: Medicare Other

## 2016-04-28 ENCOUNTER — Ambulatory Visit: Payer: Medicare Other

## 2016-04-29 ENCOUNTER — Other Ambulatory Visit: Payer: Medicare Other

## 2016-04-29 ENCOUNTER — Ambulatory Visit: Payer: Medicare Other

## 2016-05-02 ENCOUNTER — Ambulatory Visit: Payer: Medicare Other | Admitting: Infectious Diseases

## 2016-05-02 ENCOUNTER — Encounter (HOSPITAL_BASED_OUTPATIENT_CLINIC_OR_DEPARTMENT_OTHER): Payer: Medicare Other | Attending: Internal Medicine

## 2016-05-02 DIAGNOSIS — L89154 Pressure ulcer of sacral region, stage 4: Secondary | ICD-10-CM | POA: Diagnosis not present

## 2016-05-02 DIAGNOSIS — L89314 Pressure ulcer of right buttock, stage 4: Secondary | ICD-10-CM | POA: Diagnosis not present

## 2016-05-02 DIAGNOSIS — G8250 Quadriplegia, unspecified: Secondary | ICD-10-CM | POA: Diagnosis not present

## 2016-05-02 DIAGNOSIS — I10 Essential (primary) hypertension: Secondary | ICD-10-CM | POA: Insufficient documentation

## 2016-05-02 DIAGNOSIS — L8993 Pressure ulcer of unspecified site, stage 3: Secondary | ICD-10-CM | POA: Insufficient documentation

## 2016-05-02 DIAGNOSIS — L89322 Pressure ulcer of left buttock, stage 2: Secondary | ICD-10-CM | POA: Diagnosis not present

## 2016-05-02 DIAGNOSIS — G4730 Sleep apnea, unspecified: Secondary | ICD-10-CM | POA: Insufficient documentation

## 2016-05-02 DIAGNOSIS — L89893 Pressure ulcer of other site, stage 3: Secondary | ICD-10-CM | POA: Diagnosis not present

## 2016-05-02 DIAGNOSIS — L89323 Pressure ulcer of left buttock, stage 3: Secondary | ICD-10-CM | POA: Insufficient documentation

## 2016-05-05 DIAGNOSIS — M869 Osteomyelitis, unspecified: Secondary | ICD-10-CM | POA: Diagnosis not present

## 2016-05-05 DIAGNOSIS — G8254 Quadriplegia, C5-C7 incomplete: Secondary | ICD-10-CM | POA: Diagnosis not present

## 2016-05-05 DIAGNOSIS — H6123 Impacted cerumen, bilateral: Secondary | ICD-10-CM | POA: Diagnosis not present

## 2016-05-05 DIAGNOSIS — L89154 Pressure ulcer of sacral region, stage 4: Secondary | ICD-10-CM | POA: Diagnosis not present

## 2016-05-05 DIAGNOSIS — Z0000 Encounter for general adult medical examination without abnormal findings: Secondary | ICD-10-CM | POA: Diagnosis not present

## 2016-05-05 DIAGNOSIS — Z993 Dependence on wheelchair: Secondary | ICD-10-CM | POA: Diagnosis not present

## 2016-05-07 ENCOUNTER — Encounter (HOSPITAL_COMMUNITY): Payer: Self-pay | Admitting: Emergency Medicine

## 2016-05-07 ENCOUNTER — Inpatient Hospital Stay (HOSPITAL_COMMUNITY)
Admission: EM | Admit: 2016-05-07 | Discharge: 2016-05-22 | DRG: 853 | Disposition: A | Discharge status: Home Health | Payer: Medicare Other | Attending: Family Medicine | Admitting: Family Medicine

## 2016-05-07 DIAGNOSIS — Y838 Other surgical procedures as the cause of abnormal reaction of the patient, or of later complication, without mention of misadventure at the time of the procedure: Secondary | ICD-10-CM | POA: Diagnosis present

## 2016-05-07 DIAGNOSIS — Z933 Colostomy status: Secondary | ICD-10-CM

## 2016-05-07 DIAGNOSIS — L89154 Pressure ulcer of sacral region, stage 4: Secondary | ICD-10-CM | POA: Diagnosis present

## 2016-05-07 DIAGNOSIS — T148XXA Other injury of unspecified body region, initial encounter: Secondary | ICD-10-CM

## 2016-05-07 DIAGNOSIS — M8618 Other acute osteomyelitis, other site: Secondary | ICD-10-CM | POA: Diagnosis not present

## 2016-05-07 DIAGNOSIS — Z9359 Other cystostomy status: Secondary | ICD-10-CM

## 2016-05-07 DIAGNOSIS — S14105S Unspecified injury at C5 level of cervical spinal cord, sequela: Secondary | ICD-10-CM

## 2016-05-07 DIAGNOSIS — B958 Unspecified staphylococcus as the cause of diseases classified elsewhere: Secondary | ICD-10-CM | POA: Diagnosis present

## 2016-05-07 DIAGNOSIS — T887XXA Unspecified adverse effect of drug or medicament, initial encounter: Secondary | ICD-10-CM | POA: Diagnosis not present

## 2016-05-07 DIAGNOSIS — T814XXA Infection following a procedure, initial encounter: Secondary | ICD-10-CM | POA: Diagnosis not present

## 2016-05-07 DIAGNOSIS — D638 Anemia in other chronic diseases classified elsewhere: Secondary | ICD-10-CM | POA: Diagnosis present

## 2016-05-07 DIAGNOSIS — S12400S Unspecified displaced fracture of fifth cervical vertebra, sequela: Secondary | ICD-10-CM | POA: Diagnosis not present

## 2016-05-07 DIAGNOSIS — M009 Pyogenic arthritis, unspecified: Secondary | ICD-10-CM | POA: Diagnosis present

## 2016-05-07 DIAGNOSIS — Z452 Encounter for adjustment and management of vascular access device: Secondary | ICD-10-CM | POA: Diagnosis not present

## 2016-05-07 DIAGNOSIS — L89893 Pressure ulcer of other site, stage 3: Secondary | ICD-10-CM | POA: Diagnosis not present

## 2016-05-07 DIAGNOSIS — G8250 Quadriplegia, unspecified: Secondary | ICD-10-CM | POA: Diagnosis not present

## 2016-05-07 DIAGNOSIS — M86659 Other chronic osteomyelitis, unspecified thigh: Secondary | ICD-10-CM

## 2016-05-07 DIAGNOSIS — Z79899 Other long term (current) drug therapy: Secondary | ICD-10-CM

## 2016-05-07 DIAGNOSIS — I10 Essential (primary) hypertension: Secondary | ICD-10-CM | POA: Diagnosis not present

## 2016-05-07 DIAGNOSIS — M8610 Other acute osteomyelitis, unspecified site: Secondary | ICD-10-CM | POA: Diagnosis present

## 2016-05-07 DIAGNOSIS — R6889 Other general symptoms and signs: Secondary | ICD-10-CM | POA: Diagnosis not present

## 2016-05-07 DIAGNOSIS — K219 Gastro-esophageal reflux disease without esophagitis: Secondary | ICD-10-CM | POA: Diagnosis present

## 2016-05-07 DIAGNOSIS — N319 Neuromuscular dysfunction of bladder, unspecified: Secondary | ICD-10-CM | POA: Diagnosis not present

## 2016-05-07 DIAGNOSIS — Z6826 Body mass index (BMI) 26.0-26.9, adult: Secondary | ICD-10-CM

## 2016-05-07 DIAGNOSIS — T83518S Infection and inflammatory reaction due to other urinary catheter, sequela: Secondary | ICD-10-CM

## 2016-05-07 DIAGNOSIS — Z803 Family history of malignant neoplasm of breast: Secondary | ICD-10-CM | POA: Diagnosis not present

## 2016-05-07 DIAGNOSIS — N3000 Acute cystitis without hematuria: Secondary | ICD-10-CM

## 2016-05-07 DIAGNOSIS — M4628 Osteomyelitis of vertebra, sacral and sacrococcygeal region: Secondary | ICD-10-CM | POA: Diagnosis present

## 2016-05-07 DIAGNOSIS — L89303 Pressure ulcer of unspecified buttock, stage 3: Secondary | ICD-10-CM | POA: Diagnosis not present

## 2016-05-07 DIAGNOSIS — Z8619 Personal history of other infectious and parasitic diseases: Secondary | ICD-10-CM | POA: Diagnosis not present

## 2016-05-07 DIAGNOSIS — Z9989 Dependence on other enabling machines and devices: Secondary | ICD-10-CM

## 2016-05-07 DIAGNOSIS — G4733 Obstructive sleep apnea (adult) (pediatric): Secondary | ICD-10-CM | POA: Diagnosis not present

## 2016-05-07 DIAGNOSIS — L089 Local infection of the skin and subcutaneous tissue, unspecified: Secondary | ICD-10-CM

## 2016-05-07 DIAGNOSIS — Z87440 Personal history of urinary (tract) infections: Secondary | ICD-10-CM

## 2016-05-07 DIAGNOSIS — L8944 Pressure ulcer of contiguous site of back, buttock and hip, stage 4: Secondary | ICD-10-CM | POA: Diagnosis not present

## 2016-05-07 DIAGNOSIS — A419 Sepsis, unspecified organism: Secondary | ICD-10-CM | POA: Diagnosis not present

## 2016-05-07 DIAGNOSIS — E43 Unspecified severe protein-calorie malnutrition: Secondary | ICD-10-CM | POA: Diagnosis not present

## 2016-05-07 DIAGNOSIS — B9620 Unspecified Escherichia coli [E. coli] as the cause of diseases classified elsewhere: Secondary | ICD-10-CM | POA: Diagnosis present

## 2016-05-07 DIAGNOSIS — L89151 Pressure ulcer of sacral region, stage 1: Secondary | ICD-10-CM | POA: Diagnosis not present

## 2016-05-07 DIAGNOSIS — F329 Major depressive disorder, single episode, unspecified: Secondary | ICD-10-CM | POA: Diagnosis present

## 2016-05-07 DIAGNOSIS — D72829 Elevated white blood cell count, unspecified: Secondary | ICD-10-CM

## 2016-05-07 DIAGNOSIS — B9689 Other specified bacterial agents as the cause of diseases classified elsewhere: Secondary | ICD-10-CM | POA: Diagnosis not present

## 2016-05-07 DIAGNOSIS — Z888 Allergy status to other drugs, medicaments and biological substances status: Secondary | ICD-10-CM | POA: Diagnosis not present

## 2016-05-07 DIAGNOSIS — J90 Pleural effusion, not elsewhere classified: Secondary | ICD-10-CM | POA: Diagnosis not present

## 2016-05-07 DIAGNOSIS — T801XXS Vascular complications following infusion, transfusion and therapeutic injection, sequela: Secondary | ICD-10-CM

## 2016-05-07 DIAGNOSIS — Z993 Dependence on wheelchair: Secondary | ICD-10-CM

## 2016-05-07 DIAGNOSIS — T8189XA Other complications of procedures, not elsewhere classified, initial encounter: Secondary | ICD-10-CM | POA: Diagnosis not present

## 2016-05-07 DIAGNOSIS — I739 Peripheral vascular disease, unspecified: Secondary | ICD-10-CM | POA: Diagnosis not present

## 2016-05-07 DIAGNOSIS — N390 Urinary tract infection, site not specified: Secondary | ICD-10-CM | POA: Diagnosis not present

## 2016-05-07 DIAGNOSIS — M868X8 Other osteomyelitis, other site: Secondary | ICD-10-CM | POA: Diagnosis not present

## 2016-05-07 DIAGNOSIS — T368X5A Adverse effect of other systemic antibiotics, initial encounter: Secondary | ICD-10-CM | POA: Diagnosis not present

## 2016-05-07 DIAGNOSIS — S31102A Unspecified open wound of abdominal wall, epigastric region without penetration into peritoneal cavity, initial encounter: Secondary | ICD-10-CM | POA: Diagnosis not present

## 2016-05-07 DIAGNOSIS — M8660 Other chronic osteomyelitis, unspecified site: Secondary | ICD-10-CM | POA: Diagnosis not present

## 2016-05-07 DIAGNOSIS — L89109 Pressure ulcer of unspecified part of back, unspecified stage: Secondary | ICD-10-CM | POA: Diagnosis not present

## 2016-05-07 DIAGNOSIS — N179 Acute kidney failure, unspecified: Secondary | ICD-10-CM | POA: Diagnosis not present

## 2016-05-07 DIAGNOSIS — L986 Other infiltrative disorders of the skin and subcutaneous tissue: Secondary | ICD-10-CM | POA: Diagnosis not present

## 2016-05-07 DIAGNOSIS — Z833 Family history of diabetes mellitus: Secondary | ICD-10-CM | POA: Diagnosis not present

## 2016-05-07 DIAGNOSIS — L98424 Non-pressure chronic ulcer of back with necrosis of bone: Secondary | ICD-10-CM | POA: Diagnosis not present

## 2016-05-07 DIAGNOSIS — S31809A Unspecified open wound of unspecified buttock, initial encounter: Secondary | ICD-10-CM | POA: Diagnosis not present

## 2016-05-07 DIAGNOSIS — Z981 Arthrodesis status: Secondary | ICD-10-CM

## 2016-05-07 DIAGNOSIS — A488 Other specified bacterial diseases: Secondary | ICD-10-CM | POA: Diagnosis not present

## 2016-05-07 DIAGNOSIS — R7989 Other specified abnormal findings of blood chemistry: Secondary | ICD-10-CM | POA: Diagnosis not present

## 2016-05-07 LAB — COMPREHENSIVE METABOLIC PANEL
ALK PHOS: 82 U/L (ref 38–126)
ALT: 13 U/L — ABNORMAL LOW (ref 17–63)
ANION GAP: 9 (ref 5–15)
AST: 17 U/L (ref 15–41)
Albumin: 2.6 g/dL — ABNORMAL LOW (ref 3.5–5.0)
BUN: 22 mg/dL — ABNORMAL HIGH (ref 6–20)
CALCIUM: 8.8 mg/dL — AB (ref 8.9–10.3)
CO2: 22 mmol/L (ref 22–32)
Chloride: 105 mmol/L (ref 101–111)
Creatinine, Ser: 0.54 mg/dL — ABNORMAL LOW (ref 0.61–1.24)
Glucose, Bld: 103 mg/dL — ABNORMAL HIGH (ref 65–99)
Potassium: 4.4 mmol/L (ref 3.5–5.1)
SODIUM: 136 mmol/L (ref 135–145)
TOTAL PROTEIN: 7.7 g/dL (ref 6.5–8.1)
Total Bilirubin: 0.7 mg/dL (ref 0.3–1.2)

## 2016-05-07 LAB — CBC WITH DIFFERENTIAL/PLATELET
Basophils Absolute: 0 10*3/uL (ref 0.0–0.1)
Basophils Relative: 0 %
EOS ABS: 0.5 10*3/uL (ref 0.0–0.7)
EOS PCT: 3 %
HCT: 29.8 % — ABNORMAL LOW (ref 39.0–52.0)
HEMOGLOBIN: 9.2 g/dL — AB (ref 13.0–17.0)
LYMPHS ABS: 2 10*3/uL (ref 0.7–4.0)
Lymphocytes Relative: 13 %
MCH: 24 pg — AB (ref 26.0–34.0)
MCHC: 30.9 g/dL (ref 30.0–36.0)
MCV: 77.8 fL — AB (ref 78.0–100.0)
MONOS PCT: 13 %
Monocytes Absolute: 1.9 10*3/uL — ABNORMAL HIGH (ref 0.1–1.0)
Neutro Abs: 11 10*3/uL — ABNORMAL HIGH (ref 1.7–7.7)
Neutrophils Relative %: 71 %
PLATELETS: 385 10*3/uL (ref 150–400)
RBC: 3.83 MIL/uL — ABNORMAL LOW (ref 4.22–5.81)
RDW: 17.9 % — ABNORMAL HIGH (ref 11.5–15.5)
WBC: 15.4 10*3/uL — ABNORMAL HIGH (ref 4.0–10.5)

## 2016-05-07 LAB — URINALYSIS, ROUTINE W REFLEX MICROSCOPIC
Bilirubin Urine: NEGATIVE
GLUCOSE, UA: NEGATIVE mg/dL
Ketones, ur: 5 mg/dL — AB
NITRITE: NEGATIVE
PH: 7 (ref 5.0–8.0)
Protein, ur: 30 mg/dL — AB
SPECIFIC GRAVITY, URINE: 1.014 (ref 1.005–1.030)

## 2016-05-07 LAB — I-STAT CG4 LACTIC ACID, ED: LACTIC ACID, VENOUS: 1.1 mmol/L (ref 0.5–1.9)
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
## 2016-05-07 IMAGING — CR DG CHEST 1V
1 series · 1 of 1 positions shown · non-contrast
Comparison: None.

CLINICAL DATA: Cough and sepsis

EXAM:
CHEST - 1 VIEW

[view not recorded]
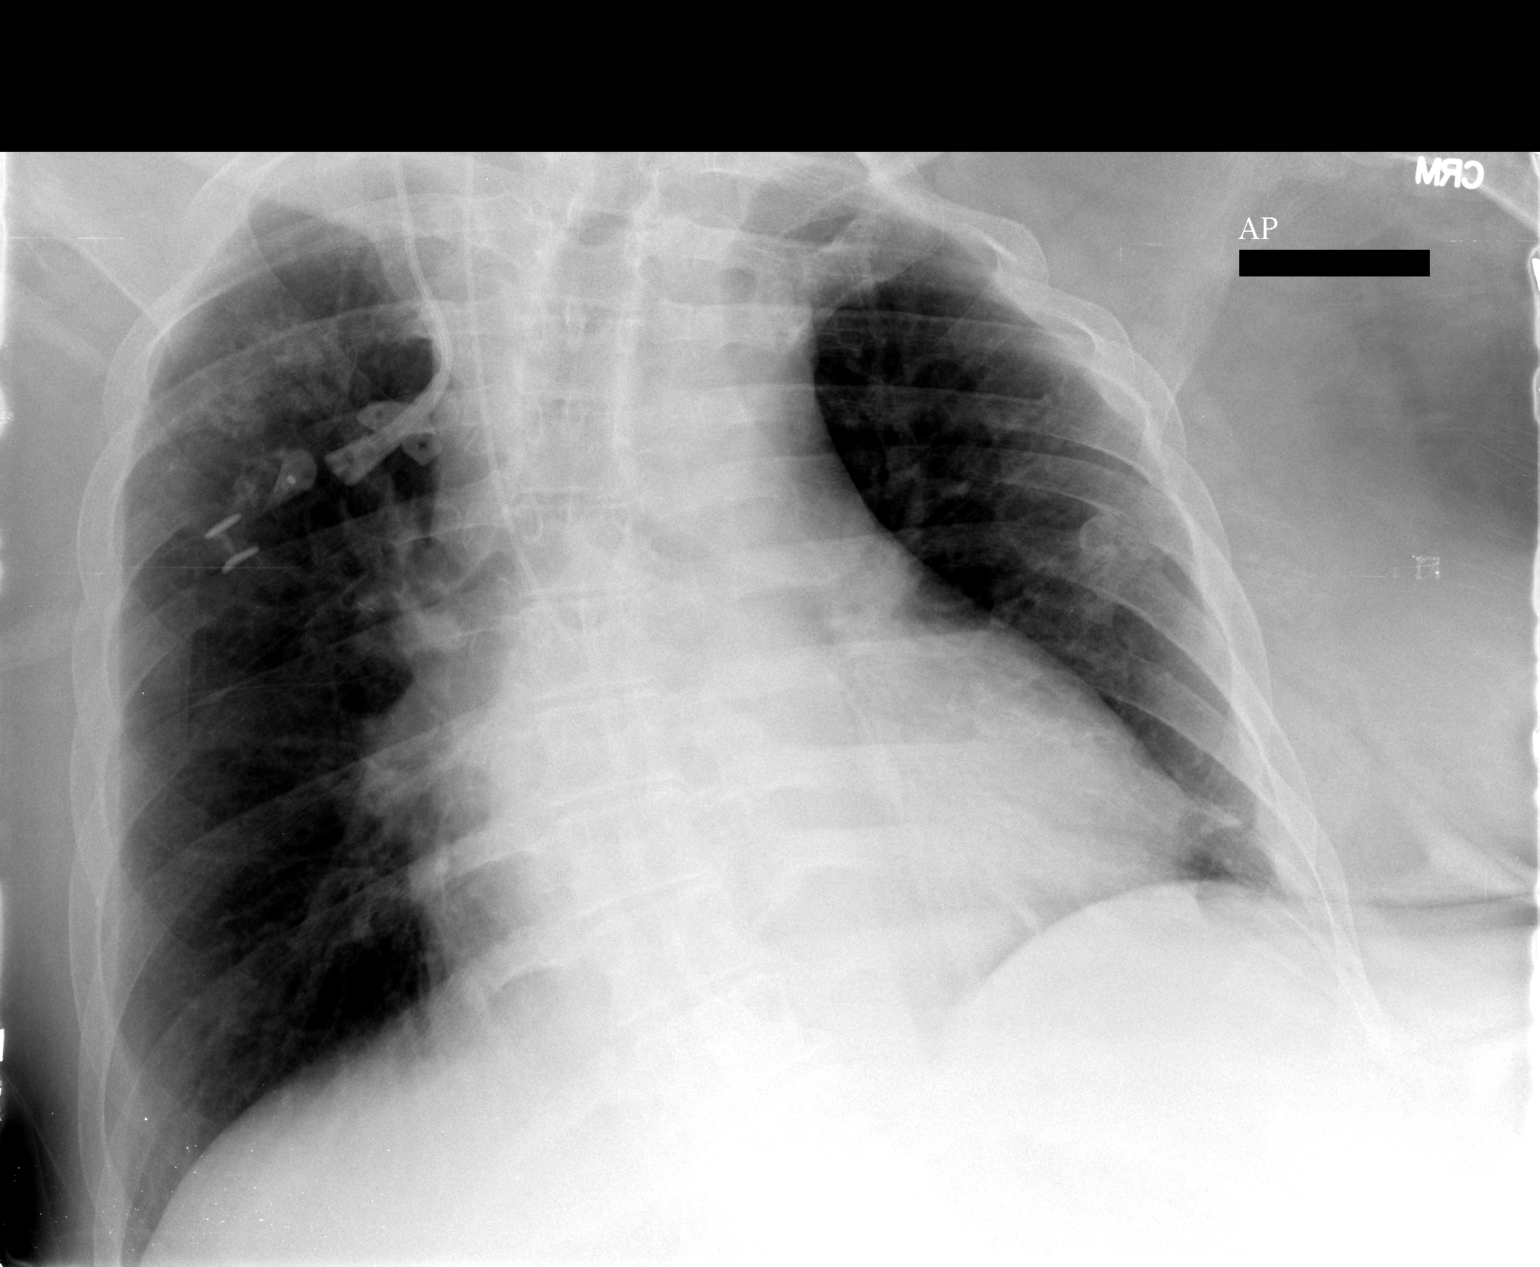

[1 of 1 positions shown; findings below may reference images not displayed]

FINDINGS: Cardiac shadow is enlarged. The lungs are well aerated with some
mild scarring in both lungs. A right jugular central line is seen in
satisfactory position. No acute abnormality is noted.
IMPRESSION: Chronic changes without acute abnormality.

## 2016-05-07 IMAGING — XA IR FLUORO GUIDE CV LINE*R*
1 series · 2 of 2 positions shown · non-contrast
Comparison: none

CLINICAL DATA: Chronic sacral decubitus ulcer and right lower
extremity wound and infection. Tunneled central venous catheter
requested.

[Series 1: run · 2 of 2 slices shown]
[im 1/2]
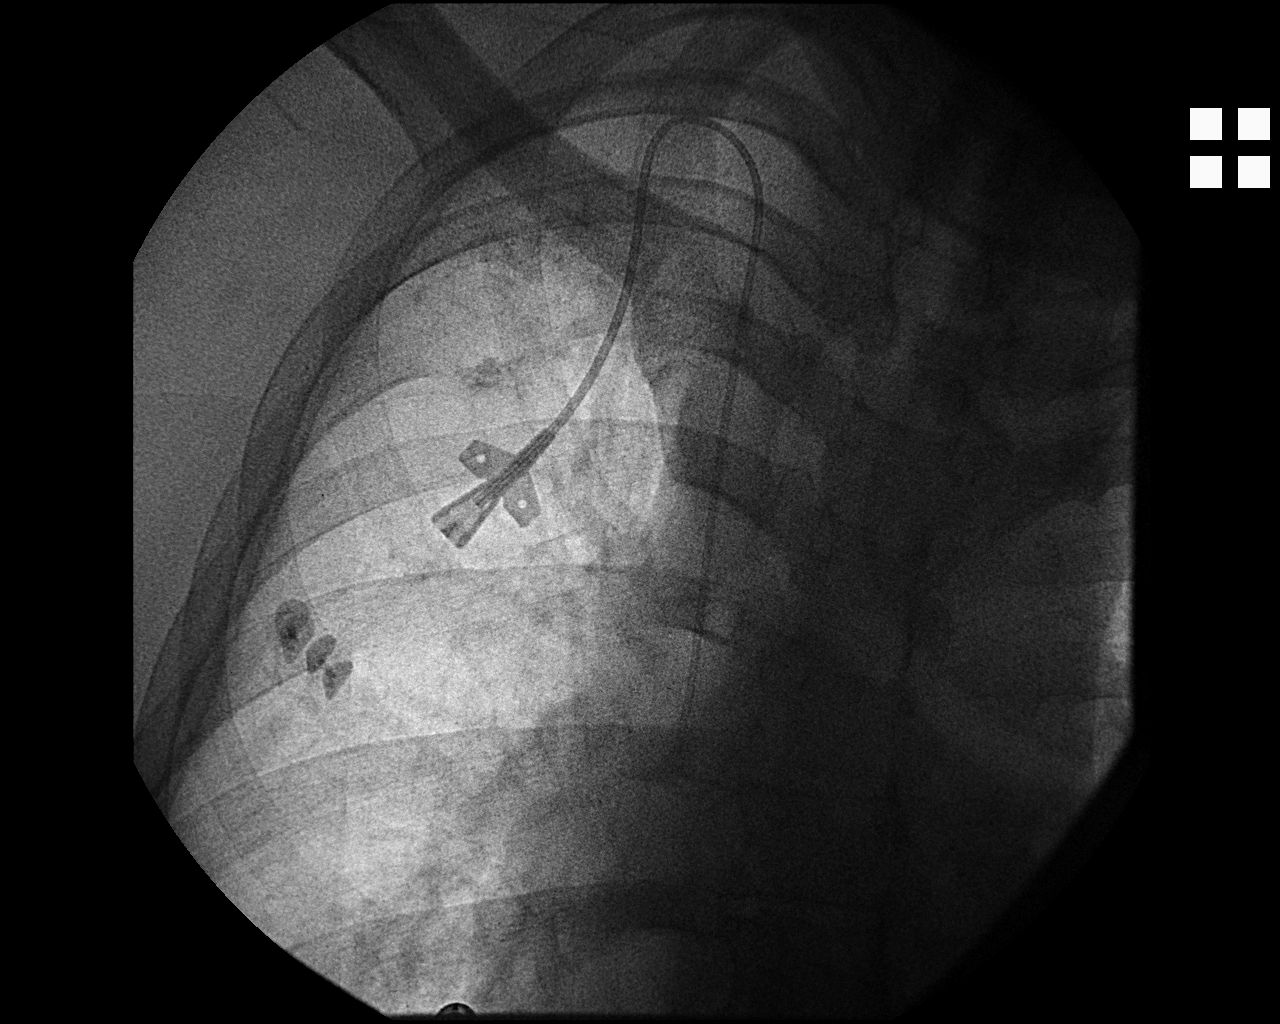
[im 2/2]
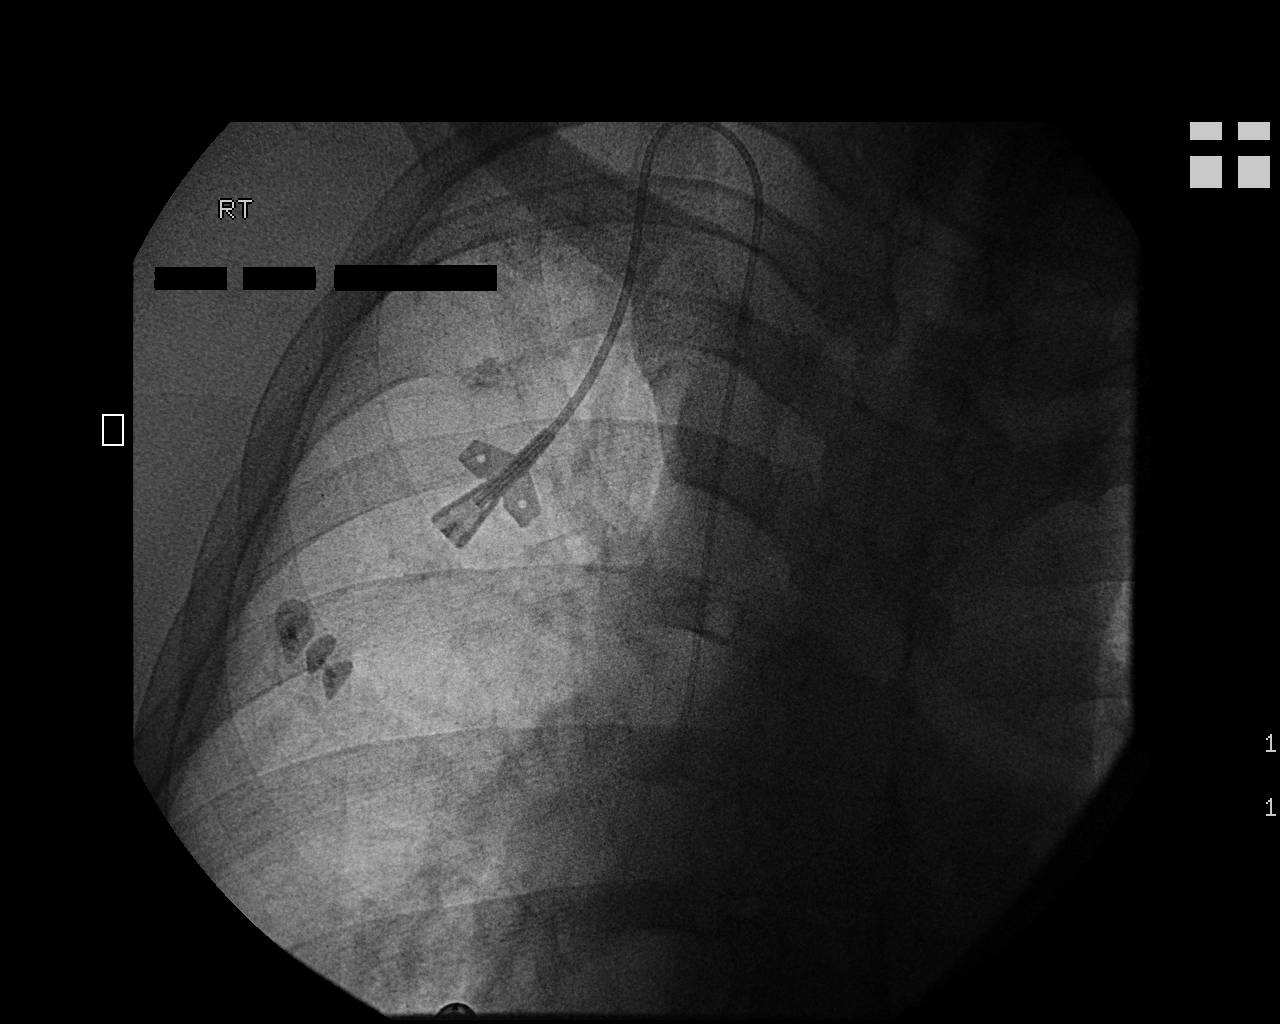

[2 of 2 positions shown; findings below may reference images not displayed]

EXAM:
TUNNELED CENTRAL VENOUS CATHETER PLACEMENT WITH ULTRASOUND AND
FLUOROSCOPIC GUIDANCE

ANESTHESIA/SEDATION:
None

MEDICATIONS:
None

FLUOROSCOPY TIME:  12 seconds.

PROCEDURE:
The procedure, risks, benefits, and alternatives were explained to
the patient. Questions regarding the procedure were encouraged and
answered. The patient understands and consents to the procedure.

The right neck and chest were prepped with chlorhexidine in a
sterile fashion, and a sterile drape was applied covering the
operative field. Maximum barrier sterile technique with sterile
gowns and gloves were used for the procedure. Local anesthesia was
provided with 1% lidocaine.

Ultrasound was used to confirm patency of the right internal jugular
vein. After creating a small venotomy incision, a 21 gauge needle
was advanced into the right internal jugular vein under direct,
real-time ultrasound guidance. Ultrasound image documentation was
performed. After securing guidewire access, an 8 Fr dilator was
placed. A J-wire was kinked to measure appropriate catheter length.

A 6 French, dual-lumen tunneled central venous catheter measuring 24
cm from tip to cuff was chosen for placement. This was tunneled in a
retrograde fashion from the chest wall to the venotomy incision.

At the venotomy, a 6 Fr peel-away sheath was placed over a
guidewire. The catheter was then placed through the sheath and the
sheath removed. Final catheter positioning was confirmed and
documented with a fluoroscopic spot image. The catheter was
aspirated and flushed with saline

The venotomy incision was closed with subcutaneous 4-0 Vicryl.
Dermabond was applied to the incision. The catheter exit site was
secured with 0-Prolene retention sutures.

COMPLICATIONS:
None.  No pneumothorax.
FINDINGS: After catheter placement, the tip lies at the cavoatrial junction.
The catheter aspirates normally and is ready for immediate use.
IMPRESSION: Placement of tunneled central venous catheter via the right internal
jugular vein. The catheter tip lies at the cavoatrial junction. The
catheter is ready for immediate use.

## 2016-05-07 IMAGING — US IR FLUORO GUIDE CV LINE*R*
1 series · 1 of 1 positions shown · non-contrast
Comparison: none

CLINICAL DATA: Chronic sacral decubitus ulcer and right lower
extremity wound and infection. Tunneled central venous catheter
requested.

[Series 1: ir fluoro guide cv line*right* · 1 of 1 slices shown]
[im 1/1]
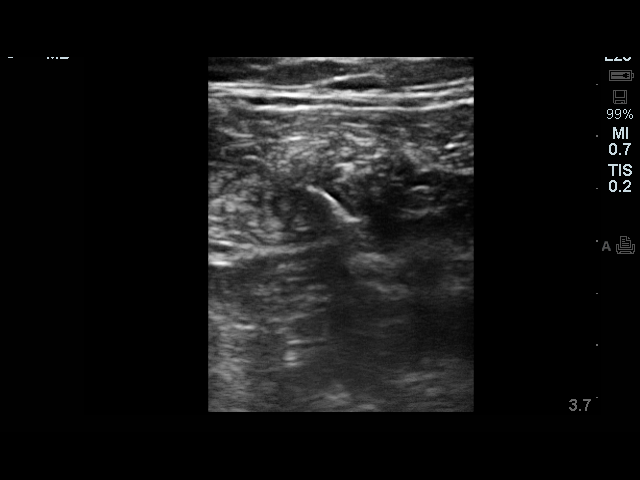

[1 of 1 positions shown; findings below may reference images not displayed]

EXAM:
TUNNELED CENTRAL VENOUS CATHETER PLACEMENT WITH ULTRASOUND AND
FLUOROSCOPIC GUIDANCE

ANESTHESIA/SEDATION:
None

MEDICATIONS:
None

FLUOROSCOPY TIME:  12 seconds.

PROCEDURE:
The procedure, risks, benefits, and alternatives were explained to
the patient. Questions regarding the procedure were encouraged and
answered. The patient understands and consents to the procedure.

The right neck and chest were prepped with chlorhexidine in a
sterile fashion, and a sterile drape was applied covering the
operative field. Maximum barrier sterile technique with sterile
gowns and gloves were used for the procedure. Local anesthesia was
provided with 1% lidocaine.

Ultrasound was used to confirm patency of the right internal jugular
vein. After creating a small venotomy incision, a 21 gauge needle
was advanced into the right internal jugular vein under direct,
real-time ultrasound guidance. Ultrasound image documentation was
performed. After securing guidewire access, an 8 Fr dilator was
placed. A J-wire was kinked to measure appropriate catheter length.

A 6 French, dual-lumen tunneled central venous catheter measuring 24
cm from tip to cuff was chosen for placement. This was tunneled in a
retrograde fashion from the chest wall to the venotomy incision.

At the venotomy, a 6 Fr peel-away sheath was placed over a
guidewire. The catheter was then placed through the sheath and the
sheath removed. Final catheter positioning was confirmed and
documented with a fluoroscopic spot image. The catheter was
aspirated and flushed with saline

The venotomy incision was closed with subcutaneous 4-0 Vicryl.
Dermabond was applied to the incision. The catheter exit site was
secured with 0-Prolene retention sutures.

COMPLICATIONS:
None.  No pneumothorax.
FINDINGS: After catheter placement, the tip lies at the cavoatrial junction.
The catheter aspirates normally and is ready for immediate use.
IMPRESSION: Placement of tunneled central venous catheter via the right internal
jugular vein. The catheter tip lies at the cavoatrial junction. The
catheter is ready for immediate use.

## 2016-05-07 MED ORDER — PIPERACILLIN-TAZOBACTAM 3.375 G IVPB
3.3750 g | Freq: Once | INTRAVENOUS | Status: AC
  Administered 2016-05-07: 3.375 g via INTRAVENOUS
  Filled 2016-05-07: qty 50

## 2016-05-07 MED ORDER — VANCOMYCIN HCL IN DEXTROSE 1-5 GM/200ML-% IV SOLN
1000.0000 mg | Freq: Once | INTRAVENOUS | Status: AC
  Administered 2016-05-07: 1000 mg via INTRAVENOUS
  Filled 2016-05-07: qty 200

## 2016-05-07 MED ORDER — SODIUM CHLORIDE 0.9 % IV BOLUS (SEPSIS)
1000.0000 mL | Freq: Once | INTRAVENOUS | Status: AC
  Administered 2016-05-07: 1000 mL via INTRAVENOUS
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
## 2016-05-07 NOTE — ED Notes (Signed)
Dr. Oleta Mouse inserted a EJ IV due to inability of this RN as well as IV team to start a peripheral IV. Labs and one set of cultures were collected at this time

## 2016-05-07 NOTE — ED Notes (Signed)
This RN was unable to start an IV. Attempted 1 time

## 2016-05-07 NOTE — ED Notes (Signed)
Bed: [id number] Expected date:  Expected time:  Means of arrival:  Comments: EMS infected pressure ulcer

## 2016-05-07 NOTE — ED Notes (Addendum)
IV team was unable to obtain IV access. EDP notified. Per IV team, pt normally has PICC lines when admitted

## 2016-05-07 NOTE — ED Triage Notes (Signed)
Pt from home via EMS with complaints of drainage from 3 sores on his back, bottom, and right leg x 3 weeks. Pt's home health called EMS today because pt had a low grade fever of 99.8. Pt is a quadriplegic

## 2016-05-07 NOTE — ED Notes (Signed)
Per charge nurse on the 4th floor bed has not been approved yet for the patient due to nurses passing medications and receiving new patients recently. Thanked 4th floor for this information and asked her top please call back when they are ready.  ED charge nurse notified.
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
## 2016-05-08 ENCOUNTER — Inpatient Hospital Stay (HOSPITAL_COMMUNITY): Payer: Medicare Other

## 2016-05-08 DIAGNOSIS — Z833 Family history of diabetes mellitus: Secondary | ICD-10-CM

## 2016-05-08 DIAGNOSIS — Z803 Family history of malignant neoplasm of breast: Secondary | ICD-10-CM

## 2016-05-08 DIAGNOSIS — B9689 Other specified bacterial agents as the cause of diseases classified elsewhere: Secondary | ICD-10-CM

## 2016-05-08 DIAGNOSIS — Z888 Allergy status to other drugs, medicaments and biological substances status: Secondary | ICD-10-CM

## 2016-05-08 DIAGNOSIS — Z87440 Personal history of urinary (tract) infections: Secondary | ICD-10-CM

## 2016-05-08 LAB — BLOOD CULTURE ID PANEL (REFLEXED)
ACINETOBACTER BAUMANNII: NOT DETECTED
Candida albicans: NOT DETECTED
Candida glabrata: NOT DETECTED
Candida krusei: NOT DETECTED
Candida parapsilosis: NOT DETECTED
Candida tropicalis: NOT DETECTED
ENTEROCOCCUS SPECIES: NOT DETECTED
Enterobacter cloacae complex: NOT DETECTED
Enterobacteriaceae species: NOT DETECTED
Escherichia coli: NOT DETECTED
HAEMOPHILUS INFLUENZAE: NOT DETECTED
Klebsiella oxytoca: NOT DETECTED
Klebsiella pneumoniae: NOT DETECTED
LISTERIA MONOCYTOGENES: NOT DETECTED
METHICILLIN RESISTANCE: DETECTED — AB
NEISSERIA MENINGITIDIS: NOT DETECTED
PROTEUS SPECIES: NOT DETECTED
PSEUDOMONAS AERUGINOSA: NOT DETECTED
SERRATIA MARCESCENS: NOT DETECTED
STAPHYLOCOCCUS AUREUS BCID: NOT DETECTED
STAPHYLOCOCCUS SPECIES: DETECTED — AB
STREPTOCOCCUS AGALACTIAE: NOT DETECTED
STREPTOCOCCUS PNEUMONIAE: NOT DETECTED
Streptococcus pyogenes: NOT DETECTED
Streptococcus species: NOT DETECTED

## 2016-05-08 LAB — MRSA PCR SCREENING: MRSA BY PCR: NEGATIVE

## 2016-05-08 MED ORDER — SODIUM CHLORIDE 0.9 % IV SOLN
INTRAVENOUS | Status: AC
  Administered 2016-05-08: 04:00:00 via INTRAVENOUS

## 2016-05-08 MED ORDER — MAGNESIUM OXIDE 400 (241.3 MG) MG PO TABS
400.0000 mg | ORAL_TABLET | Freq: Every day | ORAL | Status: DC
  Administered 2016-05-08 – 2016-05-22 (×15): 400 mg via ORAL
  Filled 2016-05-08 (×15): qty 1

## 2016-05-08 MED ORDER — PIPERACILLIN-TAZOBACTAM 3.375 G IVPB
3.3750 g | Freq: Three times a day (TID) | INTRAVENOUS | Status: DC
  Administered 2016-05-08 (×2): 3.375 g via INTRAVENOUS
  Filled 2016-05-08 (×4): qty 50

## 2016-05-08 MED ORDER — SODIUM CHLORIDE 0.9% FLUSH
3.0000 mL | Freq: Two times a day (BID) | INTRAVENOUS | Status: DC
  Administered 2016-05-08 – 2016-05-21 (×6): 3 mL via INTRAVENOUS

## 2016-05-08 MED ORDER — HYDROCODONE-ACETAMINOPHEN 5-325 MG PO TABS
1.0000 | ORAL_TABLET | ORAL | Status: DC | PRN
  Administered 2016-05-19: 2 via ORAL
  Filled 2016-05-08 (×3): qty 2

## 2016-05-08 MED ORDER — ONDANSETRON HCL 4 MG PO TABS: 4.0000 mg | ORAL_TABLET | Freq: Four times a day (QID) | ORAL | Status: DC | PRN

## 2016-05-08 MED ORDER — FERROUS SULFATE 325 (65 FE) MG PO TABS
325.0000 mg | ORAL_TABLET | Freq: Three times a day (TID) | ORAL | Status: DC
  Administered 2016-05-08 – 2016-05-22 (×44): 325 mg via ORAL
  Filled 2016-05-08 (×43): qty 1

## 2016-05-08 MED ORDER — PANTOPRAZOLE SODIUM 40 MG PO TBEC
40.0000 mg | DELAYED_RELEASE_TABLET | Freq: Two times a day (BID) | ORAL | Status: DC
  Administered 2016-05-08 – 2016-05-22 (×29): 40 mg via ORAL
  Filled 2016-05-08 (×28): qty 1

## 2016-05-08 MED ORDER — JUVEN PO PACK
1.0000 | PACK | Freq: Two times a day (BID) | ORAL | Status: DC
  Administered 2016-05-08 – 2016-05-09 (×3): 1 via ORAL
  Filled 2016-05-08 (×6): qty 1

## 2016-05-08 MED ORDER — ENOXAPARIN SODIUM 40 MG/0.4ML ~~LOC~~ SOLN
40.0000 mg | Freq: Every day | SUBCUTANEOUS | Status: DC
  Administered 2016-05-08 – 2016-05-21 (×15): 40 mg via SUBCUTANEOUS
  Filled 2016-05-08 (×15): qty 0.4

## 2016-05-08 MED ORDER — SUCRALFATE 1 G PO TABS
1.0000 g | ORAL_TABLET | Freq: Four times a day (QID) | ORAL | Status: DC
  Administered 2016-05-08 – 2016-05-22 (×58): 1 g via ORAL
  Filled 2016-05-08 (×56): qty 1

## 2016-05-08 MED ORDER — BACLOFEN 20 MG PO TABS
20.0000 mg | ORAL_TABLET | Freq: Four times a day (QID) | ORAL | Status: DC
  Administered 2016-05-08 – 2016-05-22 (×57): 20 mg via ORAL
  Filled 2016-05-08 (×22): qty 1
  Filled 2016-05-08: qty 2
  Filled 2016-05-08 (×36): qty 1

## 2016-05-08 MED ORDER — ONDANSETRON HCL 4 MG/2ML IJ SOLN: 4.0000 mg | Freq: Four times a day (QID) | INTRAMUSCULAR | Status: DC | PRN

## 2016-05-08 MED ORDER — POTASSIUM CHLORIDE CRYS ER 20 MEQ PO TBCR
20.0000 meq | EXTENDED_RELEASE_TABLET | Freq: Two times a day (BID) | ORAL | Status: DC
  Administered 2016-05-08 – 2016-05-22 (×30): 20 meq via ORAL
  Filled 2016-05-08 (×30): qty 1

## 2016-05-08 MED ORDER — METOCLOPRAMIDE HCL 10 MG PO TABS
10.0000 mg | ORAL_TABLET | Freq: Four times a day (QID) | ORAL | Status: DC
  Administered 2016-05-08 – 2016-05-22 (×54): 10 mg via ORAL
  Filled 2016-05-08 (×56): qty 1

## 2016-05-08 MED ORDER — DOCUSATE SODIUM 100 MG PO CAPS
200.0000 mg | ORAL_CAPSULE | Freq: Two times a day (BID) | ORAL | Status: DC
  Administered 2016-05-08 – 2016-05-22 (×30): 200 mg via ORAL
  Filled 2016-05-08 (×30): qty 2

## 2016-05-08 MED ORDER — ADULT MULTIVITAMIN W/MINERALS CH
1.0000 | ORAL_TABLET | Freq: Every day | ORAL | Status: DC
  Administered 2016-05-08 – 2016-05-22 (×15): 1 via ORAL
  Filled 2016-05-08 (×15): qty 1

## 2016-05-08 MED ORDER — ACETAMINOPHEN 325 MG PO TABS
650.0000 mg | ORAL_TABLET | Freq: Four times a day (QID) | ORAL | Status: DC | PRN
  Administered 2016-05-13 – 2016-05-18 (×2): 650 mg via ORAL
  Filled 2016-05-08 (×2): qty 2

## 2016-05-08 MED ORDER — ACETAMINOPHEN 650 MG RE SUPP: 650.0000 mg | Freq: Four times a day (QID) | RECTAL | Status: DC | PRN

## 2016-05-08 MED ORDER — VANCOMYCIN HCL IN DEXTROSE 1-5 GM/200ML-% IV SOLN
1000.0000 mg | Freq: Three times a day (TID) | INTRAVENOUS | Status: DC
  Administered 2016-05-08 (×2): 1000 mg via INTRAVENOUS
  Filled 2016-05-08 (×3): qty 200

## 2016-05-08 MED ORDER — BOOST PLUS PO LIQD
237.0000 mL | Freq: Three times a day (TID) | ORAL | Status: DC
  Administered 2016-05-08 – 2016-05-18 (×26): 237 mL via ORAL
  Filled 2016-05-08 (×31): qty 237

## 2016-05-08 MED ORDER — DARIFENACIN HYDROBROMIDE ER 7.5 MG PO TB24
7.5000 mg | ORAL_TABLET | Freq: Every day | ORAL | Status: DC
Start: 2016-05-08 — End: 2016-05-23
  Administered 2016-05-08 – 2016-05-22 (×14): 7.5 mg via ORAL
  Filled 2016-05-08 (×16): qty 1

## 2016-05-08 NOTE — Progress Notes (Signed)
PHARMACY - PHYSICIAN COMMUNICATION CRITICAL VALUE ALERT - BLOOD CULTURE IDENTIFICATION (BCID)  Results for orders placed or performed during the hospital encounter of 05/07/16  Blood Culture ID Panel (Reflexed) (Collected: 05/07/2016  5:53 PM)  Result Value Ref Range   Enterococcus species NOT DETECTED NOT DETECTED   Listeria monocytogenes NOT DETECTED NOT DETECTED   Staphylococcus species DETECTED (A) NOT DETECTED   Staphylococcus aureus NOT DETECTED NOT DETECTED   Methicillin resistance DETECTED (A) NOT DETECTED   Streptococcus species NOT DETECTED NOT DETECTED   Streptococcus agalactiae NOT DETECTED NOT DETECTED   Streptococcus pneumoniae NOT DETECTED NOT DETECTED   Streptococcus pyogenes NOT DETECTED NOT DETECTED   Acinetobacter baumannii NOT DETECTED NOT DETECTED   Enterobacteriaceae species NOT DETECTED NOT DETECTED   Enterobacter cloacae complex NOT DETECTED NOT DETECTED   Escherichia coli NOT DETECTED NOT DETECTED   Klebsiella oxytoca NOT DETECTED NOT DETECTED   Klebsiella pneumoniae NOT DETECTED NOT DETECTED   Proteus species NOT DETECTED NOT DETECTED   Serratia marcescens NOT DETECTED NOT DETECTED   Haemophilus influenzae NOT DETECTED NOT DETECTED   Neisseria meningitidis NOT DETECTED NOT DETECTED   Pseudomonas aeruginosa NOT DETECTED NOT DETECTED   Candida albicans NOT DETECTED NOT DETECTED   Candida glabrata NOT DETECTED NOT DETECTED   Candida krusei NOT DETECTED NOT DETECTED   Candida parapsilosis NOT DETECTED NOT DETECTED   Candida tropicalis NOT DETECTED NOT DETECTED    Name of physician (or Provider) Contacted: A. Hamad    Changes to prescribed antibiotics required: on Vancomycin and Zosyn.  Followed by ID.   Dorrene German 05/08/2016  8:30 PM
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
## 2016-05-08 NOTE — Progress Notes (Signed)
Pharmacy Antibiotic Note  [name] [name] is a 50 y.o. male admitted on 05/07/2016 with sepsis and UTI.  Pharmacy has been consulted for  dosing.  Plan: Vancomycin 1gm IV every 8 hours.  Goal trough 15-20 mcg/mL. Zosyn 3.375g IV q8h (4 hour infusion).  Weight: 187 lb 2.7 oz (84.9 kg)  Temp (24hrs), Avg:98.6 F (37 C), Min:98.6 F (37 C), Max:98.6 F (37 C)   Recent Labs Lab 05/07/16 1755 05/07/16 1808  WBC 15.4*  --   CREATININE 0.54*  --   LATICACIDVEN  --  1.10    Estimated Creatinine Clearance: 115.3 mL/min (by C-G formula based on SCr of 0.54 mg/dL (L)).    Allergies  Allergen Reactions  . Ditropan [Oxybutynin] Other (See Comments)    hallucinations  . Feraheme [Ferumoxytol] Other (See Comments)    unknown    Antimicrobials this admission: Vancomycin 05/07/2016 >> Zosyn 05/07/2016 >>   Dose adjustments this admission: -  Microbiology results: pending  Thank you for allowing pharmacy to be a part of this patient's care.  Nani Skillern [place] 05/08/2016 2:13 AM

## 2016-05-08 NOTE — Progress Notes (Signed)
Per Lab Tech unable to draw blood for lab works. MD made aware.
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
## 2016-05-09 DIAGNOSIS — L8944 Pressure ulcer of contiguous site of back, buttock and hip, stage 4: Secondary | ICD-10-CM

## 2016-05-09 LAB — URINE CULTURE

## 2016-05-09 IMAGING — CR DG ABD PORTABLE 1V
1 series · 1 of 1 positions shown · non-contrast
Comparison: Portable exam 9722 hr compared to 01/30/2013

CLINICAL DATA: Abdominal distension, hypotension, history
quadriplegia

EXAM:
PORTABLE ABDOMEN - 1 VIEW

[AP]
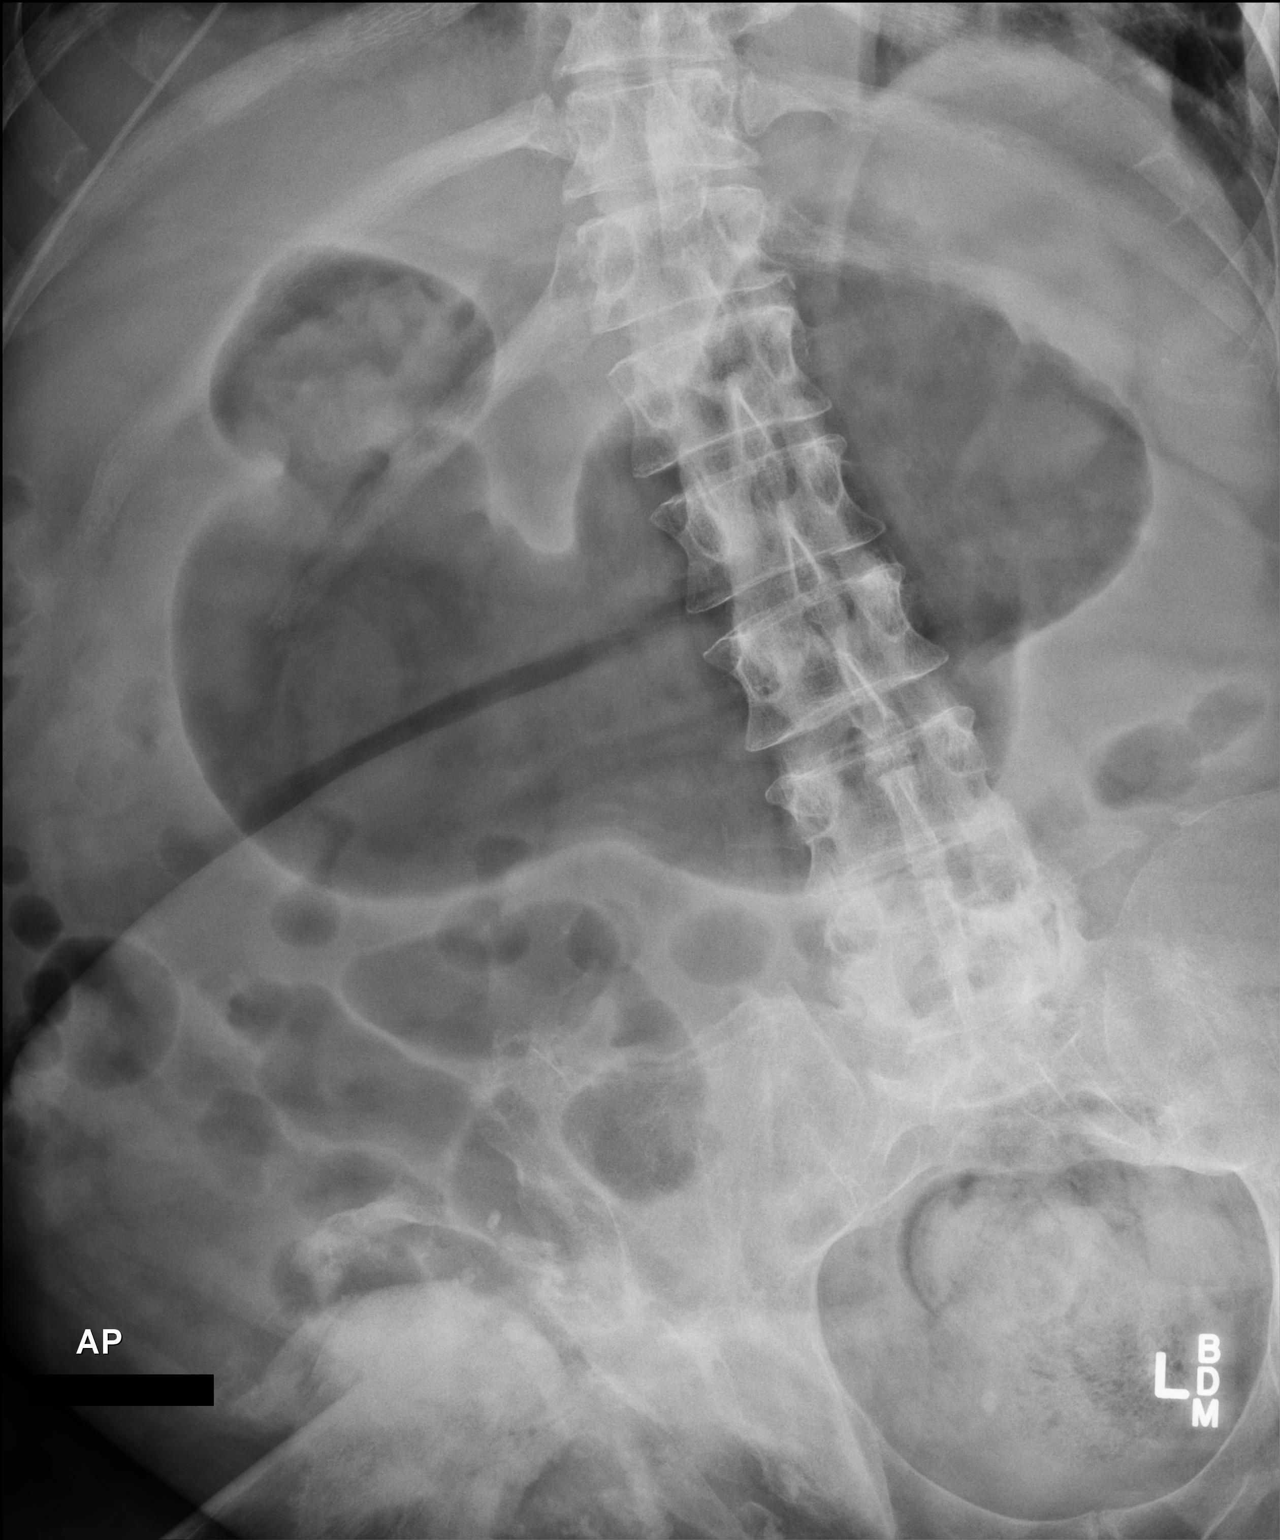

[1 of 1 positions shown; findings below may reference images not displayed]

FINDINGS: Gaseous distention of stomach.

Increased stool in rectum.

Bowel gas pattern otherwise normal.

No bowel wall thickening.

Bones demineralized with degenerative changes and pseudo
acetabularization at RIGHT hip.
IMPRESSION: Gaseous distention of stomach.

Mildly increased stool in rectum.

## 2016-05-09 NOTE — Progress Notes (Signed)
Spoke with primary RN regarding PICC insertion.   IV Team no longer inserts PICCs on this pt due to scarring, occlusions, etc.   PICCs are inserted by Interventional Radiology.   Primary RN made aware.
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
## 2016-05-09 NOTE — Progress Notes (Signed)
Pt lost IV access. Multiple attempts were done. IV team consulted and unable to start an IV. On call MD made aware. Will continue to monitor.

## 2016-05-10 DIAGNOSIS — L89154 Pressure ulcer of sacral region, stage 4: Secondary | ICD-10-CM

## 2016-05-10 DIAGNOSIS — A488 Other specified bacterial diseases: Secondary | ICD-10-CM

## 2016-05-10 DIAGNOSIS — L89303 Pressure ulcer of unspecified buttock, stage 3: Secondary | ICD-10-CM

## 2016-05-10 DIAGNOSIS — G8250 Quadriplegia, unspecified: Secondary | ICD-10-CM

## 2016-05-10 MED ORDER — COLLAGENASE 250 UNIT/GM EX OINT
TOPICAL_OINTMENT | Freq: Every day | CUTANEOUS | Status: DC
  Administered 2016-05-11 – 2016-05-22 (×10): via TOPICAL
  Filled 2016-05-10 (×3): qty 30

## 2016-05-10 MED ORDER — LINEZOLID 600 MG PO TABS
600.0000 mg | ORAL_TABLET | ORAL | Status: AC
  Administered 2016-05-10: 600 mg via ORAL
  Filled 2016-05-10: qty 1

## 2016-05-10 MED ORDER — PRO-STAT SUGAR FREE PO LIQD
30.0000 mL | Freq: Two times a day (BID) | ORAL | Status: DC
  Administered 2016-05-10 – 2016-05-22 (×23): 30 mL via ORAL
  Filled 2016-05-10 (×25): qty 30

## 2016-05-10 MED ORDER — LINEZOLID 600 MG PO TABS
600.0000 mg | ORAL_TABLET | Freq: Two times a day (BID) | ORAL | Status: DC
  Administered 2016-05-10 – 2016-05-11 (×3): 600 mg via ORAL
  Filled 2016-05-10 (×5): qty 1

## 2016-05-10 NOTE — Consult Note (Signed)
[place] Nurse wound consult note Reason for Consult: multiple pressure injuries. Patient well known to [facility] nurse team from previous admissions. 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 Chronic moisture associated skin damage to the scrotum and penis from leaking SP cath. Colostomy for bowel diversion.  Wound type: Sacrum Stage 4 Pressure Injury: 20cm x 15cm x 0.5cm; clean, pink, non granular Right ischium Stage 4 Pressure injury: 3cm x 3cm x 8cm tunneled, clean , pink, non granular Right flank wound Stage 3 Pressure injury: 9cm x 6cm x 0.5cm; 40% yellow/brown slough/60% pink dry tissue, non granular Penile partial thickness skin loss related to moisture: 1cm x 1cm x 0; 100% yellow Right lateral ankle: 3cm x 1cm x 0.1cm; 100% pink, pale, dry Pressure Ulcer POA: Yes Measurement: see above Wound bed: see above Drainage (amount, consistency, odor) moderate to heavy from the large sacral and ischial ulcers, chronic osteomyelitis with chronic drainage and musty odor.  Unchanged since our last assessment in October Periwound: intact with scarring over the entire area from healing and re-epithelializations Dressing procedure/placement/frequency: Continue saline moist gauze to the sacrum and ischial wounds, do not appear infected Add enzymatic debridement ointment to the right flank wound to clean up the wound Add low air loss mattress for pressure redistribution.  [place] Nurse ostomy consult note Stoma type/location: LLQ, end colostomy Patient using 2pc, supplies on the floor for use.  Staff to change weekly and PRN.  Staff to empty, patient completely dependent in ostomy care.  Discussed POC with patient and bedside nurse.  Re consult if needed, will not follow at this time. Thanks  Shirline Kendle [employer], RN,CWOCN, CNS [phone])
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
## 2016-05-10 NOTE — Progress Notes (Signed)
INFECTIOUS DISEASE PROGRESS NOTE  ID: [name] [name] is a 50 y.o. male with  Active Problems:   Quadriplegia ([place])   HTN (hypertension)   Sacral decubitus ulcer, stage IV ([name]   Suprapubic catheter ([name]   OSA on CPAP   Chronic osteomyelitis, pelvic region and thigh ([name]   Severe protein-calorie malnutrition ([place])   Decubitus ulcer of ischium, stage 3 ([name]   History of MDR Pseudomonas aeruginosa infection   Anemia of chronic disease   Sepsis ([place])   UTI (urinary tract infection)  Subjective: Without complaints.   Abtx:  Anti-infectives    Start     Dose/Rate Route Frequency Ordered Stop   05/10/16 2200  linezolid (ZYVOX) tablet 600 mg     600 mg Oral Every 12 hours 05/10/16 1209     05/10/16 0930  linezolid (ZYVOX) tablet 600 mg     600 mg Oral NOW 05/10/16 0923 05/10/16 0936   05/08/16 0600  vancomycin (VANCOCIN) IVPB 1000 mg/200 mL premix  Status:  Discontinued     1,000 mg 200 mL/hr over 60 Minutes Intravenous Every 8 hours 05/08/16 0153 05/10/16 1208   05/08/16 0600  piperacillin-tazobactam (ZOSYN) IVPB 3.375 g     3.375 g 12.5 mL/hr over 240 Minutes Intravenous Every 8 hours 05/08/16 0153     05/07/16 1930  vancomycin (VANCOCIN) IVPB 1000 mg/200 mL premix     1,000 mg 200 mL/hr over 60 Minutes Intravenous  Once 05/07/16 1918 05/07/16 2130   05/07/16 1930  piperacillin-tazobactam (ZOSYN) IVPB 3.375 g     3.375 g 12.5 mL/hr over 240 Minutes Intravenous  Once 05/07/16 1918 05/08/16 0039      Medications:  Scheduled: . baclofen  20 mg Oral QID  . collagenase   Topical Daily  . darifenacin  7.5 mg Oral Daily  . docusate sodium  200 mg Oral BID  . enoxaparin (LOVENOX) injection  40 mg Subcutaneous QHS  . feeding supplement (PRO-STAT SUGAR FREE 64)  30 mL Oral BID  . ferrous sulfate  325 mg Oral TID WC  . lactose free nutrition  237 mL Oral TID WC  . linezolid  600 mg Oral Q12H  . magnesium oxide  400 mg Oral Daily  . metoCLOPramide  10 mg Oral Q6H  .  multivitamin with minerals  1 tablet Oral Daily  . pantoprazole  40 mg Oral BID AC  . piperacillin-tazobactam (ZOSYN)  IV  3.375 g Intravenous Q8H  . potassium chloride SA  20 mEq Oral BID  . sodium chloride flush  3 mL Intravenous Q12H  . sucralfate  1 g Oral QID    Objective: Vital signs in last 24 hours: Temp:  [98.6 F (37 C)-99.2 F (37.3 C)] 98.7 F (37.1 C) (12/26 1410) Pulse Rate:  [106-115] 111 (12/26 1410) Resp:  [18-30] 20 (12/26 1410) BP: (107-151)/(72-94) 107/81 (12/26 1410) SpO2:  [99 %-100 %] 99 % (12/26 1410)   General appearance: alert, cooperative and no distress Resp: clear to auscultation bilaterally Cardio: regular rate and rhythm GI: normal findings: bowel sounds normal and soft, non-tender  Lab Results  Recent Labs  05/07/16 1755  WBC 15.4*  HGB 9.2*  HCT 29.8*  NA 136  K 4.4  CL 105  CO2 22  BUN 22*  CREATININE 0.54*   Liver Panel  Recent Labs  05/07/16 1755  PROT 7.7  ALBUMIN 2.6*  AST 17  ALT 13*  ALKPHOS 82  BILITOT 0.7   Sedimentation Rate No results  for input(s): ESRSEDRATE in the last 72 hours. C-Reactive Protein No results for input(s): CRP in the last 72 hours.  Microbiology: Recent Results (from the past 240 hour(s))  Blood culture (routine x 2)     Status: Abnormal (Preliminary result)   Collection Time: 05/07/16  5:53 PM  Result Value Ref Range Status   Specimen Description BLOOD LEFT NECK JUGULAR  Final   Special Requests BOTTLES DRAWN AEROBIC AND ANAEROBIC 5CC EA  Final   Culture  Setup Time   Final    GRAM POSITIVE COCCI IN CLUSTERS IN BOTH AEROBIC AND ANAEROBIC BOTTLES CRITICAL RESULT CALLED TO, READ BACK BY AND VERIFIED WITH: B GREEN,PHARMD AT 2006 05/08/16 BY L BENFIELD    Culture (A)  Final    STAPHYLOCOCCUS SPECIES (COAGULASE NEGATIVE) THE SIGNIFICANCE OF ISOLATING THIS ORGANISM FROM A SINGLE VENIPUNCTURE CANNOT BE PREDICTED WITHOUT FURTHER CLINICAL AND CULTURE CORRELATION. SUSCEPTIBILITIES AVAILABLE  ONLY ON REQUEST. Performed at [facility]    Report Status PENDING  Incomplete  Blood Culture ID Panel (Reflexed)     Status: Abnormal   Collection Time: 05/07/16  5:53 PM  Result Value Ref Range Status   Enterococcus species NOT DETECTED NOT DETECTED Final   Listeria monocytogenes NOT DETECTED NOT DETECTED Final   Staphylococcus species DETECTED (A) NOT DETECTED Final    Comment: CRITICAL RESULT CALLED TO, READ BACK BY AND VERIFIED WITH: B GREEN,PHARMD AT 2006 05/08/16 BY L BENFIELD    Staphylococcus aureus NOT DETECTED NOT DETECTED Final   Methicillin resistance DETECTED (A) NOT DETECTED Final    Comment: CRITICAL RESULT CALLED TO, READ BACK BY AND VERIFIED WITH: B GREEN,PHARMD AT 2006 05/08/16 BY L BENFIELD    Streptococcus species NOT DETECTED NOT DETECTED Final   Streptococcus agalactiae NOT DETECTED NOT DETECTED Final   Streptococcus pneumoniae NOT DETECTED NOT DETECTED Final   Streptococcus pyogenes NOT DETECTED NOT DETECTED Final   Acinetobacter baumannii NOT DETECTED NOT DETECTED Final   Enterobacteriaceae species NOT DETECTED NOT DETECTED Final   Enterobacter cloacae complex NOT DETECTED NOT DETECTED Final   Escherichia coli NOT DETECTED NOT DETECTED Final   Klebsiella oxytoca NOT DETECTED NOT DETECTED Final   Klebsiella pneumoniae NOT DETECTED NOT DETECTED Final   Proteus species NOT DETECTED NOT DETECTED Final   Serratia marcescens NOT DETECTED NOT DETECTED Final   Haemophilus influenzae NOT DETECTED NOT DETECTED Final   Neisseria meningitidis NOT DETECTED NOT DETECTED Final   Pseudomonas aeruginosa NOT DETECTED NOT DETECTED Final   Candida albicans NOT DETECTED NOT DETECTED Final   Candida glabrata NOT DETECTED NOT DETECTED Final   Candida krusei NOT DETECTED NOT DETECTED Final   Candida parapsilosis NOT DETECTED NOT DETECTED Final   Candida tropicalis NOT DETECTED NOT DETECTED Final    Comment: Performed at [facility]  Urine culture     Status:  Abnormal   Collection Time: 05/07/16  8:17 PM  Result Value Ref Range Status   Specimen Description URINE, RANDOM  Final   Special Requests NONE  Final   Culture MULTIPLE SPECIES PRESENT, SUGGEST RECOLLECTION (A)  Final   Report Status 05/09/2016 FINAL  Final  MRSA PCR Screening     Status: None   Collection Time: 05/08/16  3:41 AM  Result Value Ref Range Status   MRSA by PCR NEGATIVE NEGATIVE Final    Comment:        The GeneXpert MRSA Assay (FDA approved for NASAL specimens only), is one component of a comprehensive MRSA colonization surveillance  program. It is not intended to diagnose MRSA infection nor to guide or monitor treatment for MRSA infections.     Studies/Results: No results found.   Assessment/Plan: Decubitus Ulcers  BCx is Coag Neg Staph Significance of a single isolate is of limited value.   Await MRI of his wound to see if any change  If no significant change, will consider bone bx Appreciate WOC eval.  Await PIC (he currently has no IV so can't get MRI).    Total days of antibiotics: 3 zosyn/zyvox         [name] Infectious Diseases (pager) [phone] [email or web address] 05/10/2016, 3:28 PM  LOS: 3 days

## 2016-05-10 NOTE — Progress Notes (Signed)
Pt still without IV access, labs unable to get blood draw.  I called IR and they are backed up.  Per IR, pt is on the bottom of the list and they will try to get to him today to place PICC.  Pelvic MRI pending PICC placement.  Also order for wound consult, called WOCN and message states she is out of the office and will be returning at 0700 on 05/11/16.  Will continue dressing changes wet to dry for now.  PO antibiotics started until IV access gained.

## 2016-05-10 NOTE — Progress Notes (Signed)
Pharmacy Antibiotic Note  [name] [name] is a 50 y.o. male admitted on 05/07/2016 with sepsis and UTI.  Pharmacy has been consulted for vancomycin and Zosyn dosing. Lost IV access 12/24 and unable to regain. Pharmacy consulted to dose Zyvox for MR-CoNS in blood until access can be regained  Significant events: 12/24 PM - loss of IV access 12/25 - unable to regain access 12/26 - IR to see, but likely in PM  Plan:  Zyvox 600 mg PO q12 hr  Once IV access regained, will resume vanc/Zosyn as previously ordered and stop Zyvox. ID notes patient unable to afford Zyvox in outpatient setting   Height: 5\' 10"  (177.8 cm) Weight: 187 lb 2.7 oz (84.9 kg) IBW/kg (Calculated) : 73  Temp (24hrs), Avg:98.8 F (37.1 C), Min:98.6 F (37 C), Max:99.2 F (37.3 C)   Recent Labs Lab 05/07/16 1755 05/07/16 1808  WBC 15.4*  --   CREATININE 0.54*  --   LATICACIDVEN  --  1.10    Estimated Creatinine Clearance: 115.3 mL/min (by C-G formula based on SCr of 0.54 mg/dL (L)).    Allergies  Allergen Reactions  . Ditropan [Oxybutynin] Other (See Comments)    hallucinations  . Feraheme [Ferumoxytol] Other (See Comments)    unknown    Antimicrobials this admission: Vancomycin 05/07/2016 >> 12/24 Zosyn 05/07/2016 >> 12/24 Zyvox 12/26 >>  Dose adjustments this admission:   Microbiology results:  12/23 BCx: 1/1 GPC, BCID CoNS-Meth Res 12/23 UCx: mult sp-final  12/24 MRSA PCR: neg  Thank you for allowing pharmacy to be a part of this patient's care.  Reuel Boom, PharmD, BCPS Pager: [phone] 05/10/2016, 12:20 PM

## 2016-05-11 DIAGNOSIS — Z8619 Personal history of other infectious and parasitic diseases: Secondary | ICD-10-CM

## 2016-05-11 LAB — CULTURE, BLOOD (ROUTINE X 2)

## 2016-05-11 MED ORDER — LEVOFLOXACIN 750 MG PO TABS
750.0000 mg | ORAL_TABLET | ORAL | Status: DC
  Administered 2016-05-11: 750 mg via ORAL
  Filled 2016-05-11: qty 1
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
## 2016-05-11 NOTE — Care Management Important Message (Signed)
Important Message  Patient Details IM Letter given to Cookie/Case Manager to present to Patient Name: [name] [name] MRN: [id number] Date of Birth: [birth year]   Medicare Important Message Given:  Yes    Kerin Salen 05/11/2016, 11:59 AM

## 2016-05-12 ENCOUNTER — Other Ambulatory Visit (HOSPITAL_COMMUNITY): Payer: Medicare Other

## 2016-05-12 ENCOUNTER — Other Ambulatory Visit (HOSPITAL_COMMUNITY): Payer: Self-pay | Admitting: Radiology

## 2016-05-12 ENCOUNTER — Inpatient Hospital Stay (HOSPITAL_COMMUNITY): Payer: Medicare Other

## 2016-05-12 ENCOUNTER — Encounter (HOSPITAL_COMMUNITY): Payer: Self-pay | Admitting: Interventional Radiology

## 2016-05-12 HISTORY — PX: IR GENERIC HISTORICAL: IMG1180011

## 2016-05-12 MED ORDER — VANCOMYCIN HCL IN DEXTROSE 1-5 GM/200ML-% IV SOLN
1000.0000 mg | Freq: Three times a day (TID) | INTRAVENOUS | Status: DC
  Administered 2016-05-12 – 2016-05-13 (×4): 1000 mg via INTRAVENOUS
  Filled 2016-05-12 (×5): qty 200

## 2016-05-12 MED ORDER — PIPERACILLIN-TAZOBACTAM 3.375 G IVPB
3.3750 g | Freq: Three times a day (TID) | INTRAVENOUS | Status: DC
  Administered 2016-05-12 – 2016-05-22 (×31): 3.375 g via INTRAVENOUS
  Filled 2016-05-12 (×31): qty 50

## 2016-05-12 MED ORDER — LIDOCAINE HCL 1 % IJ SOLN
INTRAMUSCULAR | Status: AC
  Filled 2016-05-12: qty 20

## 2016-05-12 NOTE — Progress Notes (Signed)
INFECTIOUS DISEASE PROGRESS NOTE  ID: [name] [name] is a 50 y.o. male with  Active Problems:   Quadriplegia ([place])   HTN (hypertension)   Sacral decubitus ulcer, stage IV ([name]   Suprapubic catheter ([name]   OSA on CPAP   Chronic osteomyelitis, pelvic region and thigh ([name]   Severe protein-calorie malnutrition ([place])   Decubitus ulcer of ischium, stage 3 ([name]   History of MDR Pseudomonas aeruginosa infection   Anemia of chronic disease   Sepsis ([place])   UTI (urinary tract infection)  Subjective: Without complaint.   Abtx:  Anti-infectives    Start     Dose/Rate Route Frequency Ordered Stop   05/12/16 1400  piperacillin-tazobactam (ZOSYN) IVPB 3.375 g     3.375 g 12.5 mL/hr over 240 Minutes Intravenous Every 8 hours 05/12/16 0942     05/12/16 1000  vancomycin (VANCOCIN) IVPB 1000 mg/200 mL premix     1,000 mg 200 mL/hr over 60 Minutes Intravenous Every 8 hours 05/12/16 0942     05/11/16 1400  levofloxacin (LEVAQUIN) tablet 750 mg  Status:  Discontinued     750 mg Oral Every 24 hours 05/11/16 1305 05/12/16 0942   05/10/16 2200  linezolid (ZYVOX) tablet 600 mg  Status:  Discontinued     600 mg Oral Every 12 hours 05/10/16 1209 05/12/16 0942   05/10/16 0930  linezolid (ZYVOX) tablet 600 mg     600 mg Oral NOW 05/10/16 0923 05/10/16 0936   05/08/16 0600  vancomycin (VANCOCIN) IVPB 1000 mg/200 mL premix  Status:  Discontinued     1,000 mg 200 mL/hr over 60 Minutes Intravenous Every 8 hours 05/08/16 0153 05/10/16 1208   05/08/16 0600  piperacillin-tazobactam (ZOSYN) IVPB 3.375 g  Status:  Discontinued     3.375 g 12.5 mL/hr over 240 Minutes Intravenous Every 8 hours 05/08/16 0153 05/11/16 1305   05/07/16 1930  vancomycin (VANCOCIN) IVPB 1000 mg/200 mL premix     1,000 mg 200 mL/hr over 60 Minutes Intravenous  Once 05/07/16 1918 05/07/16 2130   05/07/16 1930  piperacillin-tazobactam (ZOSYN) IVPB 3.375 g     3.375 g 12.5 mL/hr over 240 Minutes Intravenous  Once 05/07/16 1918  05/08/16 0039      Medications:  Scheduled: . baclofen  20 mg Oral QID  . collagenase   Topical Daily  . darifenacin  7.5 mg Oral Daily  . docusate sodium  200 mg Oral BID  . enoxaparin (LOVENOX) injection  40 mg Subcutaneous QHS  . feeding supplement (PRO-STAT SUGAR FREE 64)  30 mL Oral BID  . ferrous sulfate  325 mg Oral TID WC  . lactose free nutrition  237 mL Oral TID WC  . lidocaine      . magnesium oxide  400 mg Oral Daily  . metoCLOPramide  10 mg Oral Q6H  . multivitamin with minerals  1 tablet Oral Daily  . pantoprazole  40 mg Oral BID AC  . piperacillin-tazobactam (ZOSYN)  IV  3.375 g Intravenous Q8H  . potassium chloride SA  20 mEq Oral BID  . sodium chloride flush  3 mL Intravenous Q12H  . sucralfate  1 g Oral QID  . vancomycin  1,000 mg Intravenous Q8H    Objective: Vital signs in last 24 hours: Temp:  [98.4 F (36.9 C)-98.5 F (36.9 C)] 98.5 F (36.9 C) (12/28 0658) Pulse Rate:  [74-100] 74 (12/28 0658) Resp:  [16] 16 (12/27 2235) BP: (130-158)/(89-97) 158/97 (12/28 0658) SpO2:  [100 %]  100 % (12/28 0658)   General appearance: alert, cooperative and no distress  Lab Results No results for input(s): WBC, HGB, HCT, NA, K, CL, CO2, BUN, CREATININE, GLU in the last 72 hours.  Invalid input(s): PLATELETS Liver Panel No results for input(s): PROT, ALBUMIN, AST, ALT, ALKPHOS, BILITOT, BILIDIR, IBILI in the last 72 hours. Sedimentation Rate No results for input(s): ESRSEDRATE in the last 72 hours. C-Reactive Protein No results for input(s): CRP in the last 72 hours.  Microbiology: Recent Results (from the past 240 hour(s))  Blood culture (routine x 2)     Status: Abnormal   Collection Time: 05/07/16  5:53 PM  Result Value Ref Range Status   Specimen Description BLOOD LEFT NECK JUGULAR  Final   Special Requests BOTTLES DRAWN AEROBIC AND ANAEROBIC 5CC EA  Final   Culture  Setup Time   Final    GRAM POSITIVE COCCI IN CLUSTERS IN BOTH AEROBIC AND ANAEROBIC  BOTTLES CRITICAL RESULT CALLED TO, READ BACK BY AND VERIFIED WITH: B GREEN,PHARMD AT 2006 05/08/16 BY L BENFIELD    Culture (A)  Final    STAPHYLOCOCCUS SPECIES (COAGULASE NEGATIVE) THE SIGNIFICANCE OF ISOLATING THIS ORGANISM FROM A SINGLE VENIPUNCTURE CANNOT BE PREDICTED WITHOUT FURTHER CLINICAL AND CULTURE CORRELATION. SUSCEPTIBILITIES AVAILABLE ONLY ON REQUEST. Performed at [id number]    Report Status 05/11/2016 FINAL  Final  Blood Culture ID Panel (Reflexed)     Status: Abnormal   Collection Time: 05/07/16  5:53 PM  Result Value Ref Range Status   Enterococcus species NOT DETECTED NOT DETECTED Final   Listeria monocytogenes NOT DETECTED NOT DETECTED Final   Staphylococcus species DETECTED (A) NOT DETECTED Final    Comment: CRITICAL RESULT CALLED TO, READ BACK BY AND VERIFIED WITH: B GREEN,PHARMD AT 2006 05/08/16 BY L BENFIELD    Staphylococcus aureus NOT DETECTED NOT DETECTED Final   Methicillin resistance DETECTED (A) NOT DETECTED Final    Comment: CRITICAL RESULT CALLED TO, READ BACK BY AND VERIFIED WITH: B GREEN,PHARMD AT 2006 05/08/16 BY L BENFIELD    Streptococcus species NOT DETECTED NOT DETECTED Final   Streptococcus agalactiae NOT DETECTED NOT DETECTED Final   Streptococcus pneumoniae NOT DETECTED NOT DETECTED Final   Streptococcus pyogenes NOT DETECTED NOT DETECTED Final   Acinetobacter baumannii NOT DETECTED NOT DETECTED Final   Enterobacteriaceae species NOT DETECTED NOT DETECTED Final   Enterobacter cloacae complex NOT DETECTED NOT DETECTED Final   Escherichia coli NOT DETECTED NOT DETECTED Final   Klebsiella oxytoca NOT DETECTED NOT DETECTED Final   Klebsiella pneumoniae NOT DETECTED NOT DETECTED Final   Proteus species NOT DETECTED NOT DETECTED Final   Serratia marcescens NOT DETECTED NOT DETECTED Final   Haemophilus influenzae NOT DETECTED NOT DETECTED Final   Neisseria meningitidis NOT DETECTED NOT DETECTED Final   Pseudomonas aeruginosa NOT DETECTED  NOT DETECTED Final   Candida albicans NOT DETECTED NOT DETECTED Final   Candida glabrata NOT DETECTED NOT DETECTED Final   Candida krusei NOT DETECTED NOT DETECTED Final   Candida parapsilosis NOT DETECTED NOT DETECTED Final   Candida tropicalis NOT DETECTED NOT DETECTED Final    Comment: Performed at [facility]  Urine culture     Status: Abnormal   Collection Time: 05/07/16  8:17 PM  Result Value Ref Range Status   Specimen Description URINE, RANDOM  Final   Special Requests NONE  Final   Culture MULTIPLE SPECIES PRESENT, SUGGEST RECOLLECTION (A)  Final   Report Status 05/09/2016 FINAL  Final  MRSA PCR Screening     Status: None   Collection Time: 05/08/16  3:41 AM  Result Value Ref Range Status   MRSA by PCR NEGATIVE NEGATIVE Final    Comment:        The GeneXpert MRSA Assay (FDA approved for NASAL specimens only), is one component of a comprehensive MRSA colonization surveillance program. It is not intended to diagnose MRSA infection nor to guide or monitor treatment for MRSA infections.     Studies/Results: Ir Fluoro Guide Cv Line Right  Result Date: 05/12/2016 INDICATION: 50 year old male in need of PICC for very poor venous access EXAM: PICC LINE PLACEMENT WITH ULTRASOUND AND FLUOROSCOPIC GUIDANCE MEDICATIONS: None ANESTHESIA/SEDATION: None FLUOROSCOPY TIME:  Fluoroscopy Time: 0 minutes 12 seconds (1.1 MGy). COMPLICATIONS: None immediate. PROCEDURE: The patient was advised of the possible risks and complications and agreed to undergo the procedure. The patient was then brought to the angiographic suite for the procedure. The right arm was prepped with chlorhexidine, draped in the usual sterile fashion using maximum barrier technique (cap and mask, sterile gown, sterile gloves, large sterile sheet, hand hygiene and cutaneous antisepsis) and infiltrated locally with 1% Lidocaine. Ultrasound demonstrated patency of the right basilic vein, and this was documented with an  image. Under real-time ultrasound guidance, this vein was accessed with a 21 gauge micropuncture needle and image documentation was performed. A 0.018 wire was introduced in to the vein. Over this, a 5 [place] dual lumen power injectable PICC was advanced to the lower SVC/right atrial junction. Fluoroscopy during the procedure and fluoro spot radiograph confirms appropriate catheter position. The catheter was flushed and covered with a sterile dressing. Catheter length: 39 cm IMPRESSION: Successful right arm power PICC line placement with ultrasound and fluoroscopic guidance. The catheter is ready for use. Electronically Signed   By: Jacqulynn Cadet M.D.   On: 05/12/2016 10:52   Ir US Guide Vasc Access Right  Result Date: 05/12/2016 INDICATION: 50 year old male in need of PICC for very poor venous access EXAM: PICC LINE PLACEMENT WITH ULTRASOUND AND FLUOROSCOPIC GUIDANCE MEDICATIONS: None ANESTHESIA/SEDATION: None FLUOROSCOPY TIME:  Fluoroscopy Time: 0 minutes 12 seconds (1.1 MGy). COMPLICATIONS: None immediate. PROCEDURE: The patient was advised of the possible risks and complications and agreed to undergo the procedure. The patient was then brought to the angiographic suite for the procedure. The right arm was prepped with chlorhexidine, draped in the usual sterile fashion using maximum barrier technique (cap and mask, sterile gown, sterile gloves, large sterile sheet, hand hygiene and cutaneous antisepsis) and infiltrated locally with 1% Lidocaine. Ultrasound demonstrated patency of the right basilic vein, and this was documented with an image. Under real-time ultrasound guidance, this vein was accessed with a 21 gauge micropuncture needle and image documentation was performed. A 0.018 wire was introduced in to the vein. Over this, a 5 [place] dual lumen power injectable PICC was advanced to the lower SVC/right atrial junction. Fluoroscopy during the procedure and fluoro spot radiograph confirms appropriate  catheter position. The catheter was flushed and covered with a sterile dressing. Catheter length: 39 cm IMPRESSION: Successful right arm power PICC line placement with ultrasound and fluoroscopic guidance. The catheter is ready for use. Electronically Signed   By: Jacqulynn Cadet M.D.   On: 05/12/2016 10:52     Assessment/Plan: Decubitus Ulcers  BCx is Coag Neg Staph Significance of a single isolate is of limited value.   He has PIC now so can proceed with MRI Await MRI of his wound to see  if any change - states he usually has to have them done at [facility] due to his size.  If no significant change, will consider bone bx Appreciate WOC eval.   Dr Baxter Flattery will f/u over long weekend   Total days of antibiotics: 4 zosyn/zyvox         [name] Infectious Diseases (pager) [phone] [email or web address] 05/12/2016, 6:09 PM  LOS: 5 days
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
## 2016-05-12 NOTE — Progress Notes (Signed)
Pt states that he will be ready for bed around midnight for the readiness for his CPAP. No distress noted at this time.

## 2016-05-13 ENCOUNTER — Ambulatory Visit (HOSPITAL_COMMUNITY): Admit: 2016-05-13 | Payer: Medicare Other

## 2016-05-13 ENCOUNTER — Ambulatory Visit (HOSPITAL_COMMUNITY)
Admit: 2016-05-13 | Discharge: 2016-05-13 | Disposition: A | Discharge status: Home / Self Care | Payer: Medicare Other | Attending: Infectious Disease | Admitting: Infectious Disease

## 2016-05-13 ENCOUNTER — Encounter (HOSPITAL_COMMUNITY): Payer: Self-pay | Admitting: Radiology

## 2016-05-13 LAB — CBC
HEMATOCRIT: 26.1 % — AB (ref 39.0–52.0)
HEMOGLOBIN: 8.1 g/dL — AB (ref 13.0–17.0)
MCH: 24.6 pg — ABNORMAL LOW (ref 26.0–34.0)
MCHC: 31 g/dL (ref 30.0–36.0)
MCV: 79.3 fL (ref 78.0–100.0)
Platelets: 455 10*3/uL — ABNORMAL HIGH (ref 150–400)
RBC: 3.29 MIL/uL — ABNORMAL LOW (ref 4.22–5.81)
RDW: 18.1 % — ABNORMAL HIGH (ref 11.5–15.5)
WBC: 11.3 10*3/uL — ABNORMAL HIGH (ref 4.0–10.5)

## 2016-05-13 LAB — BASIC METABOLIC PANEL
ANION GAP: 6 (ref 5–15)
BUN: 20 mg/dL (ref 6–20)
CO2: 25 mmol/L (ref 22–32)
Calcium: 8.7 mg/dL — ABNORMAL LOW (ref 8.9–10.3)
Chloride: 106 mmol/L (ref 101–111)
Creatinine, Ser: 0.48 mg/dL — ABNORMAL LOW (ref 0.61–1.24)
GFR calc Af Amer: >60 mL/min (ref >60)
GLUCOSE: 123 mg/dL — AB (ref 65–99)
POTASSIUM: 4 mmol/L (ref 3.5–5.1)
Sodium: 137 mmol/L (ref 135–145)

## 2016-05-13 LAB — VANCOMYCIN, TROUGH: Vancomycin Tr: 22 ug/mL (ref 15–20)

## 2016-05-13 MED ORDER — SODIUM CHLORIDE 0.9 % IV BOLUS (SEPSIS)
500.0000 mL | Freq: Once | INTRAVENOUS | Status: AC
  Administered 2016-05-13: 500 mL via INTRAVENOUS

## 2016-05-13 MED ORDER — SODIUM CHLORIDE 0.9% FLUSH
10.0000 mL | Freq: Two times a day (BID) | INTRAVENOUS | Status: DC
  Administered 2016-05-17: 10 mL

## 2016-05-13 MED ORDER — SODIUM CHLORIDE 0.9% FLUSH
10.0000 mL | INTRAVENOUS | Status: DC | PRN
  Administered 2016-05-14: 10 mL
  Administered 2016-05-19: 20 mL
  Administered 2016-05-21 – 2016-05-22 (×3): 10 mL
  Filled 2016-05-13 (×5): qty 40

## 2016-05-13 MED ORDER — VANCOMYCIN HCL 10 G IV SOLR
1250.0000 mg | Freq: Two times a day (BID) | INTRAVENOUS | Status: DC
  Administered 2016-05-14 – 2016-05-19 (×11): 1250 mg via INTRAVENOUS
  Filled 2016-05-13 (×12): qty 1250

## 2016-05-13 MED ORDER — GADOBENATE DIMEGLUMINE 529 MG/ML IV SOLN
20.0000 mL | Freq: Once | INTRAVENOUS | Status: AC | PRN
  Administered 2016-05-13: 20 mL via INTRAVENOUS

## 2016-05-13 NOTE — Progress Notes (Signed)
Pt is active with [facility] RN, will need [facility] order and face to face.

## 2016-05-13 NOTE — Progress Notes (Signed)
Pharmacy Antibiotic Note  [name] [name] is a 50 y.o. male admitted on 05/07/2016 with sepsis and UTI.  PMH significant for quadriplegia, stage IV sacral decubitus ulcer, suprapubic catheter, chronic pelvic osteomyelitis, and MDR infections.  Pharmacy was consulted for vancomycin and Zosyn dosing initially, but required Zyvox PO for MR-CoNS in blood after IV access lost on 12/24.  PICC line was placed on 12/28 and pharmacy has resumed vancomycin and Zosyn.    Today, 05/13/2016: Day #6 abx Tm 100.6 WBC 11.3 SCr 0.48, likely underestimated by quadriplegia.   Plan:  Continue Zosyn 3.375g IV Q8H infused over 4hrs.  Continue Vancomycin 1g IV q8h.  Measure Vanc trough at steady state - prior to 1800 dose tonight. Follow up renal fxn, culture results, and clinical course.   Height: 5\' 10"  (177.8 cm) Weight: 207 lb 10.8 oz (94.2 kg) (bed scale with air mattress) IBW/kg (Calculated) : 73  Temp (24hrs), Avg:99 F (37.2 C), Min:97.7 F (36.5 C), Max:100.6 F (38.1 C)   Recent Labs Lab 05/07/16 1755 05/07/16 1808 05/13/16 0657  WBC 15.4*  --  11.3*  CREATININE 0.54*  --  0.48*  LATICACIDVEN  --  1.10  --     Estimated Creatinine Clearance: 128.8 mL/min (by C-G formula based on SCr of 0.48 mg/dL (L)).    Allergies  Allergen Reactions  . Ditropan [Oxybutynin] Other (See Comments)    hallucinations  . Feraheme [Ferumoxytol] Other (See Comments)    unknown    Antimicrobials this admission: Vancomycin 05/07/2016 >>12/24; resumed 12/28 >> Zosyn 05/07/2016 >>12/24; resumed 12/28 >> Zyvox 12/26 >> 12/28 Levaquin 12/27 >> 12/28  Dose adjustments this admission:  12/29 1700 VT = _____ on vanc 1g q8h (prior to 5th dose)  Microbiology results:  12/23 BCx: 1/1 Coagulase Neg Staph species         12/23 BCID: CoNS (Meth Res) 12/23 UCx: mult species, suggest recollection   12/24 MRSA PCR: neg   Thank you for allowing pharmacy to be a part of this patient's care.  Gretta Arab  PharmD, BCPS Pager [phone] 05/13/2016 2:41 PM
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
## 2016-05-13 NOTE — Progress Notes (Signed)
Patient placed on hospital CPAP machine with no complications. Nurse aware. 

## 2016-05-13 NOTE — Progress Notes (Signed)
Brief Pharmacy Note re: Vancomycin  See full note from C. Wyatt Portela, PharmD from earlier today  O:  12/29 @ 1700 Vancomycin trough  = 22 on vanc 1g q8h (prior to 5th dose) - Goal 15-12mcg/ml  A/P:  Vancomycin trough slightly above goal range.     Decrease to Vancomycin 1250mg  IV q12h  Recheck Vancomycin trough at steady-state  Netta Cedars, PharmD, BCPS Pager: [phone] 05/13/2016@8 :41 PM

## 2016-05-13 NOTE — Progress Notes (Addendum)
CRITICAL VALUE ALERT  Critical value received:  Vanc. Trough 22  Date of notification:  05/13/2016  Time of notification:  1922  Critical value read back:Yes.    Nurse who received alert:  Randa Lynn, RN  MD notified (1st page):  Pharmacy notified and A. Hugelmeyer  Time of first page: 2000  MD notified (2nd page):  Time of second page  Responding MD:    Time MD responded:

## 2016-05-14 LAB — SEDIMENTATION RATE: SED RATE: 108 mm/h — AB (ref 0–16)
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
## 2016-05-14 NOTE — Progress Notes (Signed)
MRI showed acute on chronic osteomyelitis of pelvis. He is currently on vancomycin and piptazo x 7 days. Utility of getting bone bx would be limited since likely sterile afte prolonged abtx. Will check sed rate and crp to see if extremely elevated to decide to dedicate another course of IV abtx vs. Doing chronic suppression with oral abtx

## 2016-05-14 NOTE — Progress Notes (Signed)
Pt refuse NIV QHS for the night. Pt is stable at this time no distress noted.

## 2016-05-15 LAB — BASIC METABOLIC PANEL
Anion gap: 7 (ref 5–15)
BUN: 21 mg/dL — AB (ref 6–20)
CALCIUM: 8.8 mg/dL — AB (ref 8.9–10.3)
CO2: 26 mmol/L (ref 22–32)
CREATININE: 0.39 mg/dL — AB (ref 0.61–1.24)
Chloride: 105 mmol/L (ref 101–111)
Glucose, Bld: 120 mg/dL — ABNORMAL HIGH (ref 65–99)
Potassium: 4.3 mmol/L (ref 3.5–5.1)
SODIUM: 138 mmol/L (ref 135–145)

## 2016-05-15 LAB — CBC
HCT: 25 % — ABNORMAL LOW (ref 39.0–52.0)
Hemoglobin: 7.6 g/dL — ABNORMAL LOW (ref 13.0–17.0)
MCH: 24.4 pg — ABNORMAL LOW (ref 26.0–34.0)
MCHC: 30.4 g/dL (ref 30.0–36.0)
MCV: 80.4 fL (ref 78.0–100.0)
PLATELETS: 409 10*3/uL — AB (ref 150–400)
RBC: 3.11 MIL/uL — ABNORMAL LOW (ref 4.22–5.81)
RDW: 18.7 % — AB (ref 11.5–15.5)
WBC: 9 10*3/uL (ref 4.0–10.5)

## 2016-05-15 LAB — C-REACTIVE PROTEIN: CRP: 7.4 mg/dL — ABNORMAL HIGH (ref <1.0)
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
## 2016-05-16 NOTE — Progress Notes (Signed)
Pharmacy Antibiotic Note  [name] [name] is a 51 y.o. male admitted on 05/07/2016 with sepsis and UTI.  PMH significant for quadriplegia, stage IV sacral decubitus ulcer, suprapubic catheter, chronic pelvic osteomyelitis, and MDR infections.  Pharmacy was consulted for vancomycin and Zosyn dosing initially, but required Zyvox PO for MR-CoNS in blood after IV access lost on 12/24.  PICC line was placed on 12/28 and pharmacy has resumed vancomycin and Zosyn.    Today, 05/16/2016: Day #9 abx Afebrile. No labs today - 12/31 labs improved.  Plan:  Continue Zosyn 3.375g IV Q8H infused over 4hrs.  Continue Vancomycin 1250mg  q12  Await ID decisions on antibiotics   Height: 5\' 10"  (177.8 cm) Weight: 207 lb 10.8 oz (94.2 kg) (bed scale with air mattress) IBW/kg (Calculated) : 73  Temp (24hrs), Avg:98.2 F (36.8 C), Min:98 F (36.7 C), Max:98.4 F (36.9 C)   Recent Labs Lab 05/13/16 0657 05/13/16 1821 05/15/16 0537  WBC 11.3*  --  9.0  CREATININE 0.48*  --  0.39*  VANCOTROUGH  --  22*  --     Estimated Creatinine Clearance: 128.8 mL/min (by C-G formula based on SCr of 0.39 mg/dL (L)).    Allergies  Allergen Reactions  . Ditropan [Oxybutynin] Other (See Comments)    hallucinations  . Feraheme [Ferumoxytol] Other (See Comments)    unknown    Antimicrobials this admission: Vancomycin 05/07/2016 >>12/24; resumed 12/28 >> Zosyn 05/07/2016 >>12/24; resumed 12/28 >> Zyvox 12/26 >> 12/28 Levaquin 12/27 >> 12/28  Microbiology results:  12/23 BCx: 1/1 Coagulase Neg Staph species         12/23 BCID: CoNS (Meth Res) 12/23 UCx: mult species, suggest recollection   12/24 MRSA PCR: neg   Thank you for allowing pharmacy to be a part of this patient's care.  Adrian Saran, PharmD, BCPS Pager [phone] 05/16/2016 8:28 AM

## 2016-05-16 NOTE — Progress Notes (Addendum)
[facility] for Infectious Disease    Date of Admission:  05/07/2016   Total days of antibiotics 9        Day 7 vnaco        Day 7 piptazo           ID: [name] [name] is a 51 y.o. male with  Active Problems:   Quadriplegia ([place])   HTN (hypertension)   Sacral decubitus ulcer, stage IV ([name]   Suprapubic catheter ([name]   OSA on CPAP   Chronic osteomyelitis, pelvic region and thigh ([name]   Severe protein-calorie malnutrition ([place])   Decubitus ulcer of ischium, stage 3 ([name]   History of MDR Pseudomonas aeruginosa infection   Anemia of chronic disease   Sepsis ([place])   UTI (urinary tract infection)    Subjective: Afebrile, mri showed acute on chronic changes  Medications:  . baclofen  20 mg Oral QID  . collagenase   Topical Daily  . darifenacin  7.5 mg Oral Daily  . docusate sodium  200 mg Oral BID  . enoxaparin (LOVENOX) injection  40 mg Subcutaneous QHS  . feeding supplement (PRO-STAT SUGAR FREE 64)  30 mL Oral BID  . ferrous sulfate  325 mg Oral TID WC  . lactose free nutrition  237 mL Oral TID WC  . magnesium oxide  400 mg Oral Daily  . metoCLOPramide  10 mg Oral Q6H  . multivitamin with minerals  1 tablet Oral Daily  . pantoprazole  40 mg Oral BID AC  . piperacillin-tazobactam (ZOSYN)  IV  3.375 g Intravenous Q8H  . potassium chloride SA  20 mEq Oral BID  . sodium chloride flush  10-40 mL Intracatheter Q12H  . sodium chloride flush  3 mL Intravenous Q12H  . sucralfate  1 g Oral QID  . vancomycin  1,250 mg Intravenous Q12H    Objective: Vital signs in last 24 hours: Temp:  [98 F (36.7 C)-98.4 F (36.9 C)] 98 F (36.7 C) (01/01 1500) Pulse Rate:  [95-102] 100 (01/01 1500) Resp:  [18-20] 18 (01/01 1500) BP: (117-143)/(68-84) 117/82 (01/01 1500) SpO2:  [100 %] 100 % (01/01 1500) Physical Exam  Constitutional: He is oriented to person, place, and time. He appears well-developed and well-nourished. No distress.  HENT:  Mouth/Throat: Oropharynx is clear  and moist. No oropharyngeal exudate.  Cardiovascular: Normal rate, regular rhythm and normal heart sounds. Exam reveals no gallop and no friction rub.  No murmur heard.  Pulmonary/Chest: Effort normal and breath sounds normal. No respiratory distress. He has no wheezes.  Abdominal: Soft. Bowel sounds are normal. He exhibits no distension. There is no tenderness.  Neurological: lower extremity paraplegia    Lab Results  Recent Labs  05/15/16 0537  WBC 9.0  HGB 7.6*  HCT 25.0*  NA 138  K 4.3  CL 105  CO2 26  BUN 21*  CREATININE 0.39*   Sedimentation Rate  Recent Labs  05/14/16 1958  ESRSEDRATE 108*   C-Reactive Protein  Recent Labs  05/14/16 1958  CRP 7.4*    Microbiology: 12/23 blood cx x 1 set- MRSE Studies/Results: No results found.  IMPRESSION: 1. Severe chronic bony deformities involving both hips 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 No discrete drainable abscess is identified. 2. Changes of acute and chronic osteomyelitis involving the bony pelvis. 3. Chronic bladder wall thickening. 4. No intrapelvic abscess is identified. Assessment/Plan: Acute on chronic pelvic osteo - currently on vancomycin and piptazo. Would treat  again for 6 wk but would recommend to see if plastics can evaluate patient to see if any need for debridement  Baxter Flattery [facility] Infectious Diseases Cell: [phone] Pager: [phone]  05/16/2016, 4:49 PM
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
## 2016-05-16 NOTE — Progress Notes (Signed)
RT placed patient on CPAP. Patient setting is 10-19 cmH2O. Sterile water added to water chamber for humidification. Patient is tolerating well. RT will continue to monitor.

## 2016-05-17 ENCOUNTER — Encounter (HOSPITAL_COMMUNITY): Payer: Self-pay | Admitting: Plastic Surgery

## 2016-05-17 LAB — VANCOMYCIN, TROUGH: Vancomycin Tr: 16 ug/mL (ref 15–20)
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
## 2016-05-17 NOTE — Progress Notes (Signed)
Pharmacy Antibiotic Note  [name] [name] is a 51 y.o. male admitted on 05/07/2016 with sepsis and UTI.  PMH significant for quadriplegia, stage IV sacral decubitus ulcer, suprapubic catheter, chronic pelvic osteomyelitis, and MDR infections.  Pharmacy was consulted for vancomycin and Zosyn dosing initially, but required Zyvox PO for MR-CoNS in blood after IV access lost on 12/24.  PICC line was placed on 12/28 and pharmacy has resumed vancomycin and Zosyn.    Today, 05/17/2016: Day #10 abx Afebrile. No labs today - 12/31 labs improved.  Plan:  Continue Zosyn 3.375g IV Q8H infused over 4hrs.  Vancomycin 1250mg  q12  ID: plan 6 wk abs, Plastics following  Height: 5\' 10"  (177.8 cm) Weight: 207 lb 10.8 oz (94.2 kg) (bed scale with air mattress) IBW/kg (Calculated) : 73  Temp (24hrs), Avg:98.5 F (36.9 C), Min:98 F (36.7 C), Max:99.4 F (37.4 C)   Recent Labs Lab 05/13/16 0657 05/13/16 1821 05/15/16 0537  WBC 11.3*  --  9.0  CREATININE 0.48*  --  0.39*  VANCOTROUGH  --  22*  --     Estimated Creatinine Clearance: 128.8 mL/min (by C-G formula based on SCr of 0.39 mg/dL (L)).    Allergies  Allergen Reactions  . Ditropan [Oxybutynin] Other (See Comments)    hallucinations  . Feraheme [Ferumoxytol] Other (See Comments)    unknown   Dose adjustments this admission:  12/29 1700 VT = 22 on vanc 1g q8h (prior to 5th dose).  Decreased to 1250mg  IV q12h. 1/2 1330 VT: ____, to be drawn by IV team   Antimicrobials this admission: Vancomycin 05/07/2016 >>12/24; resumed 12/28 >> Zosyn 05/07/2016 >>12/24; resumed 12/28 >> Zyvox 12/26 >> 12/28 Levaquin 12/27 >> 12/28  Microbiology results:  12/23 BCx: 1/1 Coagulase Neg Staph species         12/23 BCID: CoNS (Meth Res) 12/23 UCx: mult species - final   12/24 MRSA PCR: neg  Thank you for allowing pharmacy to be a part of this patient's care.  Minda Ditto PharmD Pager [phone] 05/17/2016, 3:12 PM

## 2016-05-17 NOTE — Progress Notes (Signed)
Pharmacy Antibiotic Note - Brief Update  [name] [name] is a 51 y.o. male admitted on 05/07/2016 with sepsis and UTI.  PMH significant for quadriplegia, stage IV sacral decubitus ulcer, suprapubic catheter, chronic pelvic osteomyelitis, and MDR infections.  Pharmacy was consulted for vancomycin and Zosyn dosing.  See full note earlier today from Graylin Shiver PharmD for details.   Recent Labs Lab 05/13/16 0657 05/13/16 1821 05/15/16 0537 05/17/16 1518  WBC 11.3*  --  9.0  --   CREATININE 0.48*  --  0.39*  --   VANCOTROUGH  --  22*  --  16    Previous vancomycin trough level 22, elevated on vanc 1g IV q8h. Renal function likely overestimated by quadriplegia.  SCr 0.39 with CrCl > 100 ml/min.  Vancomycin trough level 16 is therapeutic on 1250 mg IV q12h, prior to 8th dose.  (Last dose given early at 0045 instead of 0200.  Trough level drawn due 1330, but drawn late at late at 1518, ~ 14.5 hours after last dose).    Plan:  Continue Zosyn 3.375g IV Q8H infused over 4hrs.  Continue Vancomycin 1250 mg IV q12   Gretta Arab PharmD, BCPS Pager [phone] 05/17/2016 3:32 PM
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
## 2016-05-18 DIAGNOSIS — M8660 Other chronic osteomyelitis, unspecified site: Secondary | ICD-10-CM

## 2016-05-18 DIAGNOSIS — M8610 Other acute osteomyelitis, unspecified site: Secondary | ICD-10-CM

## 2016-05-18 LAB — BASIC METABOLIC PANEL
Anion gap: 6 (ref 5–15)
BUN: 23 mg/dL — ABNORMAL HIGH (ref 6–20)
CHLORIDE: 103 mmol/L (ref 101–111)
CO2: 27 mmol/L (ref 22–32)
CREATININE: 0.42 mg/dL — AB (ref 0.61–1.24)
Calcium: 8.4 mg/dL — ABNORMAL LOW (ref 8.9–10.3)
Glucose, Bld: 115 mg/dL — ABNORMAL HIGH (ref 65–99)
Potassium: 3.4 mmol/L — ABNORMAL LOW (ref 3.5–5.1)
SODIUM: 136 mmol/L (ref 135–145)

## 2016-05-18 LAB — CBC
HCT: 23.4 % — ABNORMAL LOW (ref 39.0–52.0)
HEMOGLOBIN: 7.2 g/dL — AB (ref 13.0–17.0)
MCH: 24.6 pg — ABNORMAL LOW (ref 26.0–34.0)
MCHC: 30.8 g/dL (ref 30.0–36.0)
MCV: 79.9 fL (ref 78.0–100.0)
PLATELETS: 381 10*3/uL (ref 150–400)
RBC: 2.93 MIL/uL — AB (ref 4.22–5.81)
RDW: 18.9 % — ABNORMAL HIGH (ref 11.5–15.5)
WBC: 10.8 10*3/uL — ABNORMAL HIGH (ref 4.0–10.5)

## 2016-05-18 NOTE — Progress Notes (Signed)
[place] call with York Spaniel with [facility].  Dawn is following [facility] representative for home health needs as he is already active with their team.  [name] Infusion Coordinator will continue to follow to support home IV ABX needs upon DC.  If patient discharges after hours, please call [phone].   Larry Sierras 05/18/2016, 11:19 AM

## 2016-05-18 NOTE — Progress Notes (Signed)
Secretary has been with [place] team for multiple episodes of home IV ABX.  Caregiver [name] available to support as needed.  [facility] Infusion Coordinator will follow while inpatient and support IV ABX needs for home as ordered in partnership with patient's home health agency of choice.    If patient discharges after hours, please call [phone].   Larry Sierras 05/18/2016, 8:26 AM

## 2016-05-18 NOTE — Care Management Important Message (Signed)
Important Message  Patient Details IM Letter given to [name]/Case Manager to deliver to Patient Name: [name] [name] MRN: [id number] Date of Birth: 13-Feb-1966   Medicare Important Message Given:  Yes    Kerin Salen 05/18/2016, 12:10 [place] Message  Patient Details  Name: [name] [name] MRN: [id number] Date of Birth: February 14, 1966   Medicare Important Message Given:  Yes    Kerin Salen 05/18/2016, 12:09 PM
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
## 2016-05-19 ENCOUNTER — Encounter (HOSPITAL_COMMUNITY): Payer: Self-pay | Admitting: Certified Registered"

## 2016-05-19 ENCOUNTER — Encounter (HOSPITAL_COMMUNITY)
Admission: EM | Disposition: A | Discharge status: Home Health | Payer: Self-pay | Source: Home / Self Care | Attending: Nephrology

## 2016-05-19 ENCOUNTER — Inpatient Hospital Stay (HOSPITAL_COMMUNITY): Payer: Medicare Other | Admitting: Anesthesiology

## 2016-05-19 HISTORY — PX: IRRIGATION AND DEBRIDEMENT ABSCESS: SHX5252

## 2016-05-19 LAB — HEMOGLOBIN AND HEMATOCRIT, BLOOD
HCT: 25.2 % — ABNORMAL LOW (ref 39.0–52.0)
Hemoglobin: 7.7 g/dL — ABNORMAL LOW (ref 13.0–17.0)

## 2016-05-19 LAB — CBC
HCT: 24.1 % — ABNORMAL LOW (ref 39.0–52.0)
Hemoglobin: 7.3 g/dL — ABNORMAL LOW (ref 13.0–17.0)
MCH: 24.3 pg — AB (ref 26.0–34.0)
MCHC: 30.3 g/dL (ref 30.0–36.0)
MCV: 80.1 fL (ref 78.0–100.0)
Platelets: 422 10*3/uL — ABNORMAL HIGH (ref 150–400)
RBC: 3.01 MIL/uL — AB (ref 4.22–5.81)
RDW: 19.1 % — ABNORMAL HIGH (ref 11.5–15.5)
WBC: 11.1 10*3/uL — ABNORMAL HIGH (ref 4.0–10.5)

## 2016-05-19 LAB — BASIC METABOLIC PANEL
Anion gap: 5 (ref 5–15)
BUN: 28 mg/dL — ABNORMAL HIGH (ref 6–20)
CHLORIDE: 106 mmol/L (ref 101–111)
CO2: 27 mmol/L (ref 22–32)
Calcium: 8.7 mg/dL — ABNORMAL LOW (ref 8.9–10.3)
Creatinine, Ser: 0.47 mg/dL — ABNORMAL LOW (ref 0.61–1.24)
GFR calc non Af Amer: >60 mL/min (ref >60)
Glucose, Bld: 128 mg/dL — ABNORMAL HIGH (ref 65–99)
Potassium: 3.8 mmol/L (ref 3.5–5.1)
SODIUM: 138 mmol/L (ref 135–145)

## 2016-05-19 LAB — VANCOMYCIN, TROUGH: VANCOMYCIN TR: 21 ug/mL — AB (ref 15–20)

## 2016-05-19 LAB — PREPARE RBC (CROSSMATCH)

## 2016-05-19 SURGERY — MINOR INCISION AND DRAINAGE OF ABSCESS
Anesthesia: General | Site: Back

## 2016-05-19 MED ORDER — ROCURONIUM BROMIDE 10 MG/ML (PF) SYRINGE
PREFILLED_SYRINGE | INTRAVENOUS | Status: DC | PRN
  Administered 2016-05-19: 50 mg via INTRAVENOUS

## 2016-05-19 MED ORDER — SUGAMMADEX SODIUM 200 MG/2ML IV SOLN
INTRAVENOUS | Status: AC
  Filled 2016-05-19: qty 2

## 2016-05-19 MED ORDER — SODIUM CHLORIDE 0.9 % IV SOLN
Freq: Once | INTRAVENOUS | Status: AC
  Administered 2016-05-19: 10:00:00 via INTRAVENOUS

## 2016-05-19 MED ORDER — PHENYLEPHRINE 40 MCG/ML (10ML) SYRINGE FOR IV PUSH (FOR BLOOD PRESSURE SUPPORT)
PREFILLED_SYRINGE | INTRAVENOUS | Status: AC
  Filled 2016-05-19: qty 10

## 2016-05-19 MED ORDER — VANCOMYCIN HCL IN DEXTROSE 1-5 GM/200ML-% IV SOLN
1000.0000 mg | Freq: Two times a day (BID) | INTRAVENOUS | Status: DC
  Administered 2016-05-19 – 2016-05-22 (×7): 1000 mg via INTRAVENOUS
  Filled 2016-05-19 (×6): qty 200

## 2016-05-19 MED ORDER — PIPERACILLIN-TAZOBACTAM 3.375 G IVPB
INTRAVENOUS | Status: AC
  Filled 2016-05-19: qty 50

## 2016-05-19 MED ORDER — PHENYLEPHRINE 40 MCG/ML (10ML) SYRINGE FOR IV PUSH (FOR BLOOD PRESSURE SUPPORT)
PREFILLED_SYRINGE | INTRAVENOUS | Status: DC | PRN
  Administered 2016-05-19 (×2): 80 ug via INTRAVENOUS

## 2016-05-19 MED ORDER — FENTANYL CITRATE (PF) 100 MCG/2ML IJ SOLN
INTRAMUSCULAR | Status: DC | PRN
  Administered 2016-05-19 (×2): 50 ug via INTRAVENOUS

## 2016-05-19 MED ORDER — SUCCINYLCHOLINE CHLORIDE 200 MG/10ML IV SOSY
PREFILLED_SYRINGE | INTRAVENOUS | Status: AC
  Filled 2016-05-19: qty 10

## 2016-05-19 MED ORDER — LIDOCAINE 2% (20 MG/ML) 5 ML SYRINGE
INTRAMUSCULAR | Status: DC | PRN
  Administered 2016-05-19: 100 mg via INTRAVENOUS

## 2016-05-19 MED ORDER — MIDAZOLAM HCL 2 MG/2ML IJ SOLN
INTRAMUSCULAR | Status: AC
  Filled 2016-05-19: qty 2

## 2016-05-19 MED ORDER — PROPOFOL 10 MG/ML IV BOLUS
INTRAVENOUS | Status: DC | PRN
  Administered 2016-05-19: 100 mg via INTRAVENOUS

## 2016-05-19 MED ORDER — PROMETHAZINE HCL 25 MG/ML IJ SOLN: 6.2500 mg | INTRAMUSCULAR | Status: DC | PRN

## 2016-05-19 MED ORDER — PROPOFOL 10 MG/ML IV BOLUS
INTRAVENOUS | Status: AC
  Filled 2016-05-19: qty 20

## 2016-05-19 MED ORDER — SUGAMMADEX SODIUM 200 MG/2ML IV SOLN
INTRAVENOUS | Status: DC | PRN
  Administered 2016-05-19: 200 mg via INTRAVENOUS

## 2016-05-19 MED ORDER — ROCURONIUM BROMIDE 50 MG/5ML IV SOSY
PREFILLED_SYRINGE | INTRAVENOUS | Status: AC
Start: 2016-05-19 — End: 2016-05-19
  Filled 2016-05-19: qty 5

## 2016-05-19 MED ORDER — SODIUM CHLORIDE 0.9 % IV SOLN
INTRAVENOUS | Status: DC | PRN
  Administered 2016-05-19: 11:00:00 via INTRAVENOUS

## 2016-05-19 MED ORDER — FENTANYL CITRATE (PF) 100 MCG/2ML IJ SOLN: 25.0000 ug | INTRAMUSCULAR | Status: DC | PRN

## 2016-05-19 MED ORDER — POLYMYXIN B SULFATE 500000 UNITS IJ SOLR
INTRAMUSCULAR | Status: AC
  Filled 2016-05-19: qty 500000

## 2016-05-19 MED ORDER — LIDOCAINE 2% (20 MG/ML) 5 ML SYRINGE
INTRAMUSCULAR | Status: AC
  Filled 2016-05-19: qty 5

## 2016-05-19 MED ORDER — FENTANYL CITRATE (PF) 100 MCG/2ML IJ SOLN
INTRAMUSCULAR | Status: AC
  Filled 2016-05-19: qty 2

## 2016-05-19 MED ORDER — SODIUM CHLORIDE 0.9 % IV SOLN
INTRAVENOUS | Status: DC | PRN
  Administered 2016-05-19: 10:00:00 via INTRAVENOUS

## 2016-05-19 SURGICAL SUPPLY — 33 items
BLADE HEX COATED 2.75 (ELECTRODE) ×2 IMPLANT
BNDG GAUZE ELAST 4 BULKY (GAUZE/BANDAGES/DRESSINGS) IMPLANT
CORDS BIPOLAR (ELECTRODE) IMPLANT
DRAIN PENROSE 18X1/4 LTX STRL (WOUND CARE) IMPLANT
DRAPE ORTHO SPLIT 77X108 STRL (DRAPES)
DRAPE SHEET LG 3/4 BI-LAMINATE (DRAPES) IMPLANT
DRAPE STERI IOBAN 125X83 (DRAPES) IMPLANT
DRAPE SURG ORHT 6 SPLT 77X108 (DRAPES) IMPLANT
DRSG EMULSION OIL 3X16 NADH (GAUZE/BANDAGES/DRESSINGS) ×1 IMPLANT
DRSG PAD ABDOMINAL 8X10 ST (GAUZE/BANDAGES/DRESSINGS) ×4 IMPLANT
DRSG VAC ATS MED SENSATRAC (GAUZE/BANDAGES/DRESSINGS) ×1 IMPLANT
DURAPREP 26ML APPLICATOR (WOUND CARE) IMPLANT
ELECT REM PT RETURN 9FT ADLT (ELECTROSURGICAL)
ELECTRODE REM PT RTRN 9FT ADLT (ELECTROSURGICAL) IMPLANT
GAUZE SPONGE 4X4 12PLY STRL (GAUZE/BANDAGES/DRESSINGS) IMPLANT
GLOVE BIO SURGEON STRL SZ 6.5 (GLOVE) ×2 IMPLANT
GOWN STRL REUS W/TWL LRG LVL3 (GOWN DISPOSABLE) ×2 IMPLANT
HANDPIECE INTERPULSE COAX TIP (DISPOSABLE)
KIT BASIN OR (CUSTOM PROCEDURE TRAY) ×2 IMPLANT
MANIFOLD NEPTUNE II (INSTRUMENTS) ×1 IMPLANT
MATRIX SURGICAL PSM 7X10CM (Tissue) ×1 IMPLANT
MICROMATRIX 1000MG (Tissue) ×2 IMPLANT
PACK ORTHO EXTREMITY (CUSTOM PROCEDURE TRAY) ×2 IMPLANT
SET HNDPC FAN SPRY TIP SCT (DISPOSABLE) IMPLANT
SOLUTION PARTIC MCRMTRX 1000MG (Tissue) IMPLANT
STAPLER VISISTAT 35W (STAPLE) ×1 IMPLANT
STOCKINETTE 8 INCH (MISCELLANEOUS) IMPLANT
SUT ETHILON 2 0 PS N (SUTURE) IMPLANT
SUT VIC AB 5-0 PS2 18 (SUTURE) ×2 IMPLANT
SWAB COLLECTION DEVICE MRSA (MISCELLANEOUS) IMPLANT
SWAB CULTURE ESWAB REG 1ML (MISCELLANEOUS) IMPLANT
TOWEL OR 17X26 10 PK STRL BLUE (TOWEL DISPOSABLE) ×2 IMPLANT
TOWEL OR NON WOVEN STRL DISP B (DISPOSABLE) ×2 IMPLANT
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
## 2016-05-19 NOTE — Progress Notes (Signed)
Pharmacy Antibiotic Note  [name] [name] is a 51 y.o. male admitted on 05/07/2016 with sepsis and UTI.  PMH significant for quadriplegia, stage IV sacral decubitus ulcer, suprapubic catheter, chronic pelvic osteomyelitis, and MDR infections.  Pharmacy was consulted for vancomycin and Zosyn dosing initially, but required Zyvox PO for MR-CoNS in blood after IV access lost on 12/24.  PICC line was placed on 12/28 and pharmacy has resumed vancomycin and Zosyn.    Today, 05/19/2016: Day #13 abx -Renal function likely overestimated by quadriplegia.  SCr 0.47 with CrCl > 100 ml/min. - Vanc trough= 21, slightly supratherapeutic  Plan:  Continue Zosyn 3.375g IV Q8H infused over 4hrs.  decrease Vancomycin  to 1000mg  q12  ID: plan 6 wk abs, Plastics following  Height: 5\' 10"  (177.8 cm) Weight: 207 lb 10.8 oz (94.2 kg) (bed scale with air mattress) IBW/kg (Calculated) : 73  Temp (24hrs), Avg:98 F (36.7 C), Min:97.5 F (36.4 C), Max:98.6 F (37 C)   Recent Labs Lab 05/13/16 0657  05/15/16 0537 05/17/16 1518 05/18/16 0830 05/19/16 0458 05/19/16 1336  WBC 11.3*  --  9.0  --  10.8* 11.1*  --   CREATININE 0.48*  --  0.39*  --  0.42* 0.47*  --   VANCOTROUGH  --   < >  --  16  --   --  21*  < > = values in this interval not displayed.  Estimated Creatinine Clearance: 128.8 mL/min (by C-G formula based on SCr of 0.47 mg/dL (L)).    Allergies  Allergen Reactions  . Ditropan [Oxybutynin] Other (See Comments)    hallucinations  . Feraheme [Ferumoxytol] Other (See Comments)    unknown   Dose adjustments this admission:  12/29 1700 VT = 22 on vanc 1g q8h (prior to 5th dose).  Decreased to 1250mg  IV q12h. 1/4 1330 VT: 21 on 1250mg  q12h   Antimicrobials this admission: Vancomycin 05/07/2016 >>12/24; resumed 12/28 >> Zosyn 05/07/2016 >>12/24; resumed 12/28 >> Zyvox 12/26 >> 12/28 Levaquin 12/27 >> 12/28  Microbiology results:  12/23 BCx: 1/1 Coagulase Neg Staph species         12/23  BCID: CoNS (Meth Res) 12/23 UCx: mult species - final   12/24 MRSA PCR: neg  Thank you for allowing pharmacy to be a part of this patient's care.  Dolly Rias RPh 05/19/2016, 2:36 PM Pager [phone]
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
## 2016-05-19 NOTE — Anesthesia Postprocedure Evaluation (Addendum)
Anesthesia Post Note  Patient: [name] [name]  Procedure(s) Performed: Procedure(s) (LRB): IRRIGATION AND DEBRIDEMENT BACK ULCER WITH A CELL AND WOUND VAC PLACEMENT (N/A)  Patient location during evaluation: PACU Anesthesia Type: General Level of consciousness: awake and alert Pain management: pain level controlled Vital Signs Assessment: post-procedure vital signs reviewed and stable Respiratory status: spontaneous breathing, nonlabored ventilation, respiratory function stable and patient connected to nasal cannula oxygen Cardiovascular status: blood pressure returned to baseline and stable Postop Assessment: no signs of nausea or vomiting Anesthetic complications: no       Last Vitals:  Vitals:   05/19/16 1230 05/19/16 1245  BP: 110/87 118/88  Pulse: 84 82  Resp: 14 13  Temp: 36.4 C 36.6 C    Last Pain:  Vitals:   05/19/16 1230  TempSrc:   PainSc: 0-No pain                 Jazzmon Prindle S

## 2016-05-19 NOTE — Anesthesia Procedure Notes (Signed)
Procedure Name: Intubation Date/Time: 05/19/2016 10:38 AM Performed by: Noralyn Pick D Pre-anesthesia Checklist: Patient identified, Emergency Drugs available, Suction available and Patient being monitored Patient Re-evaluated:Patient Re-evaluated prior to inductionOxygen Delivery Method: Circle system utilized Preoxygenation: Pre-oxygenation with 100% oxygen Intubation Type: IV induction Ventilation: Mask ventilation without difficulty Laryngoscope Size: Mac and 4 Grade View: Grade II Tube type: Oral Tube size: 7.5 mm Number of attempts: 1 Airway Equipment and Method: Stylet Placement Confirmation: ETT inserted through vocal cords under direct vision,  positive ETCO2 and breath sounds checked- equal and bilateral Secured at: 22 cm Tube secured with: Tape Dental Injury: Teeth and Oropharynx as per pre-operative assessment

## 2016-05-19 NOTE — Interval H&P Note (Signed)
History and Physical Interval Note:  05/19/2016 10:21 AM  [name] [name]  has presented today for surgery, with the diagnosis of back ulcer  The various methods of treatment have been discussed with the patient and family. After consideration of risks, benefits and other options for treatment, the patient has consented to  Procedure(s): IRRIGATION AND DEBRIDEMENT BACK ULCER WITH A CELL AND WOUND VAC PLACEMENT (N/A) as a surgical intervention .  The patient's history has been reviewed, patient examined, no change in status, stable for surgery.  I have reviewed the patient's chart and labs.  Questions were answered to the patient's satisfaction.     [place] Going

## 2016-05-19 NOTE — Transfer of Care (Signed)
Immediate Anesthesia Transfer of Care Note  Patient: [name] [name]  Procedure(s) Performed: Procedure(s): IRRIGATION AND DEBRIDEMENT BACK ULCER WITH A CELL AND WOUND VAC PLACEMENT (N/A)  Patient Location: PACU  Anesthesia Type:General  Level of Consciousness: awake, alert  and oriented  Airway & Oxygen Therapy: Patient Spontanous Breathing and Patient connected to face mask oxygen  Post-op Assessment: Report given to RN and Post -op Vital signs reviewed and stable  Post vital signs: Reviewed and stable  Last Vitals:  Vitals:   05/19/16 0659 05/19/16 0911  BP: 136/85 97/60  Pulse: 90 86  Resp: 16   Temp: 37 C 36.7 C    Last Pain:  Vitals:   05/19/16 0911  TempSrc: Oral  PainSc:          Complications: No apparent anesthesia complications

## 2016-05-19 NOTE — Progress Notes (Signed)
[facility] for Infectious Disease    Date of Admission:  05/07/2016   Total days of antibiotics 12        Day 10 vnaco        Day 10 piptazo           ID: [name] [name] is a 51 y.o. male with  Principal Problem:   Acute on chronic osteomyelitis ([place]) Active Problems:   Quadriplegia ([place])   HTN (hypertension)   Sacral decubitus ulcer, stage IV ([name]   Suprapubic catheter ([name]   OSA on CPAP   Chronic osteomyelitis, pelvic region and thigh ([name]   Severe protein-calorie malnutrition ([name]   Decubitus ulcer of ischium, stage 3 ([name]   History of MDR Pseudomonas aeruginosa infection   Anemia of chronic disease   Sepsis ([place])   UTI (urinary tract infection)    Subjective: Afebrile, underwent debridement of pressure ulcer to back with ACell application and bone cx to exposed rib. Patient tolerated procedure without difficulty.  Medications:  . baclofen  20 mg Oral QID  . collagenase   Topical Daily  . darifenacin  7.5 mg Oral Daily  . docusate sodium  200 mg Oral BID  . enoxaparin (LOVENOX) injection  40 mg Subcutaneous QHS  . feeding supplement (PRO-STAT SUGAR FREE 64)  30 mL Oral BID  . ferrous sulfate  325 mg Oral TID WC  . magnesium oxide  400 mg Oral Daily  . metoCLOPramide  10 mg Oral Q6H  . multivitamin with minerals  1 tablet Oral Daily  . pantoprazole  40 mg Oral BID AC  . piperacillin-tazobactam (ZOSYN)  IV  3.375 g Intravenous Q8H  . potassium chloride SA  20 mEq Oral BID  . sodium chloride flush  10-40 mL Intracatheter Q12H  . sodium chloride flush  3 mL Intravenous Q12H  . sucralfate  1 g Oral QID  . vancomycin  1,000 mg Intravenous Q12H    Objective: Vital signs in last 24 hours: Temp:  [97.5 F (36.4 C)-98.6 F (37 C)] 97.8 F (36.6 C) (01/04 1245) Pulse Rate:  [81-97] 82 (01/04 1245) Resp:  [13-22] 13 (01/04 1245) BP: (97-140)/(60-91) 118/88 (01/04 1245) SpO2:  [97 %-100 %] 100 % (01/04 1245) Physical Exam  Constitutional: He is oriented  to person, place, and time. He appears well-developed and well-nourished. No distress.  HENT:  Mouth/Throat: Oropharynx is clear and moist. No oropharyngeal exudate.  Cardiovascular: Normal rate, regular rhythm and normal heart sounds. Exam reveals no gallop and no friction rub.  No murmur heard.  Pulmonary/Chest: Effort normal and breath sounds normal. No respiratory distress. He has no wheezes.  Abdominal: Soft. Bowel sounds are normal. He exhibits no distension. There is no tenderness.  Neurological: lower extremity paraplegia    Lab Results  Recent Labs  05/18/16 0830 05/19/16 0458 05/19/16 1214  WBC 10.8* 11.1*  --   HGB 7.2* 7.3* 7.7*  HCT 23.4* 24.1* 25.2*  NA 136 138  --   K 3.4* 3.8  --   CL 103 106  --   CO2 27 27  --   BUN 23* 28*  --   CREATININE 0.42* 0.47*  --    Sedimentation Rate No results for input(s): ESRSEDRATE in the last 72 hours. C-Reactive Protein No results for input(s): CRP in the last 72 hours.  Microbiology: 12/23 blood cx x 1 set- MRSE Studies/Results: No results found.  IMPRESSION: 1. Severe chronic bony deformities involving both hips likely due  to chronic septic arthritis and osteomyelitis. No discrete drainable abscess is identified. 2. Changes of acute and chronic osteomyelitis involving the bony pelvis. 3. Chronic bladder wall thickening. 4. No intrapelvic abscess is identified. Assessment/Plan: Acute on chronic pelvic osteo - currently on vancomycin and piptazo. Would treat again for 6 wk. He is currently on day 69f of 42. Will follow up on his recent OR cultures.  [name] [name] [facility] for Infectious Diseases Cell: [phone] Pager: [phone]  05/19/2016, 5:23 PM

## 2016-05-19 NOTE — Op Note (Addendum)
DATE OF OPERATION: 05/19/2016  LOCATION: [facility] Inpatient  PREOPERATIVE DIAGNOSIS: Back wound  POSTOPERATIVE DIAGNOSIS: Same  PROCEDURE:  1. Excisional Debridement of Back wound skin and soft tissue 7 x 10 cm (muscle 2 x 4 cm and  bone 2 x 3 cm) with placement of Acell (7 x 10 cm sheet and 1 gm powder) 2. Bone biopsy of rib   SURGEON: Leylany Nored Sanger Bea Duren, DO  EBL: 100 cc  CONDITION: Stable  COMPLICATIONS: None  INDICATION: The patient, [name] [name], is a 51 y.o. male born on [birth year], is here for treatment of a chronic back ulcer.  The patient has long term quadriplegia.   PROCEDURE DETAILS:  The patient was seen prior to surgery and marked.  The IV antibiotics were given. The patient was taken to the operating room and given a general anesthetic. A standard time out was performed and all information was confirmed by those in the room. SCDs were placed.   The area was prepped with betadine and draped in a sterile fashion.  The #10 blade and curette were used to excise the skin, soft tissue and muscle of the 7 x 10 cm back wound.  Due to the exposed bone a biopsy was sent for gram stain, culture and sensitivity.  All of the Acell powder and sheet were placed on the wound and secured with 5-0 Vicryl.  Adaptic was applied and secured with the Vicryl.  KY gel was applied with the VAC.  There was an excellent seal.  The patient was allowed to wake up and taken to recovery room in stable condition at the end of the case. The family was notified at the end of the case.

## 2016-05-20 ENCOUNTER — Other Ambulatory Visit: Payer: Medicare Other

## 2016-05-20 ENCOUNTER — Ambulatory Visit: Payer: Medicare Other

## 2016-05-20 LAB — CBC
HCT: 26 % — ABNORMAL LOW (ref 39.0–52.0)
HEMOGLOBIN: 8.2 g/dL — AB (ref 13.0–17.0)
MCH: 25.2 pg — ABNORMAL LOW (ref 26.0–34.0)
MCHC: 31.5 g/dL (ref 30.0–36.0)
MCV: 79.8 fL (ref 78.0–100.0)
PLATELETS: 450 10*3/uL — AB (ref 150–400)
RBC: 3.26 MIL/uL — AB (ref 4.22–5.81)
RDW: 19 % — ABNORMAL HIGH (ref 11.5–15.5)
WBC: 11.3 10*3/uL — AB (ref 4.0–10.5)

## 2016-05-20 LAB — TYPE AND SCREEN
ABO/RH(D): B POS
Antibody Screen: NEGATIVE
UNIT DIVISION: 0

## 2016-05-20 LAB — BASIC METABOLIC PANEL
ANION GAP: 6 (ref 5–15)
BUN: 27 mg/dL — ABNORMAL HIGH (ref 6–20)
CHLORIDE: 106 mmol/L (ref 101–111)
CO2: 27 mmol/L (ref 22–32)
Calcium: 8.5 mg/dL — ABNORMAL LOW (ref 8.9–10.3)
Creatinine, Ser: 0.58 mg/dL — ABNORMAL LOW (ref 0.61–1.24)
GFR calc Af Amer: >60 mL/min (ref >60)
GLUCOSE: 120 mg/dL — AB (ref 65–99)
POTASSIUM: 4.2 mmol/L (ref 3.5–5.1)
Sodium: 139 mmol/L (ref 135–145)

## 2016-05-20 MED ORDER — PHENOL 1.4 % MT LIQD
1.0000 | OROMUCOSAL | Status: DC | PRN
  Filled 2016-05-20: qty 177

## 2016-05-20 MED ORDER — MENTHOL 3 MG MT LOZG
1.0000 | LOZENGE | OROMUCOSAL | Status: DC | PRN
  Administered 2016-05-20: 3 mg via ORAL
  Filled 2016-05-20 (×2): qty 9

## 2016-05-20 NOTE — Progress Notes (Signed)
Pt will discharge home with [facility], [facility] wound VAC and [name] for infusion if needed. Will need HH orders and a face to face.

## 2016-05-20 NOTE — Progress Notes (Signed)
  Patient is currently on empiric regimen of vanco and piptazo. He underwent debridement and bone biopsy yesterday. Culture is growing gram negative. Awaiting identification and susceptibilities before we can decide on final abtx regimen for osteo treatment  Anticipate d/c on Sunday/monday.  Dr Megan Salon available this weekend for questions

## 2016-05-20 NOTE — Progress Notes (Signed)
Spoke with pt concerning Wound Vac.  Pt states that he use [facility] for wound VAC.  [name] signed paper work which was faxed to [facility] [phone].
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
## 2016-05-21 LAB — VANCOMYCIN, TROUGH: Vancomycin Tr: 18 ug/mL (ref 15–20)

## 2016-05-21 NOTE — Progress Notes (Signed)
Receive call from [place] and pt was approved for wound vac. They will deliver wound vac to pt's room today or tomorrow in am. Jonnie Finner RN CCM Case Mgmt phone [phone]
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
## 2016-05-21 NOTE — Progress Notes (Signed)
Pharmacy Antibiotic Note  [name] [name] is a 51 y.o. male admitted on 05/07/2016 with sepsis and UTI.  PMH significant for quadriplegia, stage IV sacral decubitus ulcer, suprapubic catheter, chronic pelvic osteomyelitis, and MDR infections.  Pharmacy was consulted for vancomycin and Zosyn dosing initially, but required Zyvox PO for MR-CoNS in blood after IV access lost on 12/24.  PICC line was placed on 12/28 and pharmacy has resumed vancomycin and Zosyn.    Today, 05/21/2016: Day #15 abx - Renal function likely overestimated by quadriplegia.   - 1/6 [id number] current antibiotic regimen,has grown Proteus, Pseudomonas, Serratia and Bacteroides from the same wound in the past year. - Likely discharge next 1-2 days, will obtain repeat Vanc trough today - Repeat Vanc trough in desired range - 18 mcg/ml  Plan:  Day 10/42 Zosyn 3.375g IV Q8H infused over 4hrs.  Day 10/42 Vancomycin  to 1000mg  q12 - Continue ID: plan 6 wk abs, Plastics following  Height: 5\' 10"  (177.8 cm) Weight: 207 lb 10.8 oz (94.2 kg) (bed scale with air mattress) IBW/kg (Calculated) : 73  Temp (24hrs), Avg:98.7 F (37.1 C), Min:98.4 F (36.9 C), Max:98.9 F (37.2 C)   Recent Labs Lab 05/15/16 0537 05/17/16 1518 05/18/16 0830 05/19/16 0458 05/19/16 1336 05/20/16 0439  WBC 9.0  --  10.8* 11.1*  --  11.3*  CREATININE 0.39*  --  0.42* 0.47*  --  0.58*  VANCOTROUGH  --  16  --   --  21*  --     Estimated Creatinine Clearance: 128.8 mL/min (by C-G formula based on SCr of 0.58 mg/dL (L)).    Allergies  Allergen Reactions  . Ditropan [Oxybutynin] Other (See Comments)    hallucinations  . Feraheme [Ferumoxytol] Other (See Comments)    unknown   Dose adjustments this admission:  12/29 1700 VT = 22 on vanc 1g q8h (prior to 5th dose).  Decreased to 1250mg  IV q12h. 1/4 1330 VT: 21 on 1250mg  q12h 1/6 1300 VT: 18 on 1000mg  q12  Antimicrobials this admission: Vancomycin 05/07/2016 >>12/24; resumed 12/28 >> Zosyn  05/07/2016 >>12/24; resumed 12/28 >> Zyvox 12/26 >> 12/28 Levaquin 12/27 >> 12/28  Microbiology results:  12/23 BCx: 1/1 Coagulase Neg Staph species         12/23 BCID: CoNS (Meth Res) 12/23 UCx: mult species - final   12/24 MRSA PCR: neg  Thank you for allowing pharmacy to be a part of this patient's care.  Minda Ditto PharmD Pager [phone] 05/21/2016, 3:31 PM

## 2016-05-21 NOTE — Progress Notes (Signed)
         [facility] for Infectious Disease    Date of Admission:  05/07/2016   Total days of antibiotics 15        Day 10 vancomycin and piperacillin tazobactam  Mr. Labarr most recent right rib cultures are growing a very sensitive Escherichia coli. I will continue his current antibiotic regimen since he has grown Proteus, Pseudomonas, Serratia and Bacteroides from the same wound in the past year.  Michel Bickers, MD [facility] for [name] [phone] pager   [phone] cell

## 2016-05-22 LAB — CBC
HCT: 28.2 % — ABNORMAL LOW (ref 39.0–52.0)
Hemoglobin: 8.4 g/dL — ABNORMAL LOW (ref 13.0–17.0)
MCH: 23.7 pg — ABNORMAL LOW (ref 26.0–34.0)
MCHC: 29.8 g/dL — AB (ref 30.0–36.0)
MCV: 79.7 fL (ref 78.0–100.0)
PLATELETS: 427 10*3/uL — AB (ref 150–400)
RBC: 3.54 MIL/uL — AB (ref 4.22–5.81)
RDW: 18.8 % — ABNORMAL HIGH (ref 11.5–15.5)
WBC: 11.1 10*3/uL — ABNORMAL HIGH (ref 4.0–10.5)

## 2016-05-22 LAB — BASIC METABOLIC PANEL
Anion gap: 6 (ref 5–15)
BUN: 26 mg/dL — AB (ref 6–20)
CHLORIDE: 103 mmol/L (ref 101–111)
CO2: 28 mmol/L (ref 22–32)
CREATININE: 0.42 mg/dL — AB (ref 0.61–1.24)
Calcium: 9.3 mg/dL (ref 8.9–10.3)
GFR calc Af Amer: >60 mL/min (ref >60)
GLUCOSE: 132 mg/dL — AB (ref 65–99)
POTASSIUM: 3.8 mmol/L (ref 3.5–5.1)
SODIUM: 137 mmol/L (ref 135–145)

## 2016-05-22 MED ORDER — VANCOMYCIN HCL IN DEXTROSE 1-5 GM/200ML-% IV SOLN: 1000.0000 mg | Freq: Two times a day (BID) | INTRAVENOUS | 0 refills | Status: DC

## 2016-05-22 MED ORDER — HEPARIN SOD (PORK) LOCK FLUSH 100 UNIT/ML IV SOLN
250.0000 [IU] | INTRAVENOUS | Status: AC | PRN
  Administered 2016-05-22 (×2): 250 [IU]

## 2016-05-22 MED ORDER — PIPERACILLIN-TAZOBACTAM 3.375 G IVPB: 3.3750 g | Freq: Three times a day (TID) | INTRAVENOUS | 0 refills | Status: DC

## 2016-05-22 NOTE — Care Management Note (Signed)
Case Management Note  Patient Details  Name: [name] [name] MRN: [id number] Date of Birth: 05-03-1966  Subjective/Objective:   Sepsis, UTI, acute on chronic osteomyelitis, quadiplegia               Action/Plan: Discharge Planning: AVS reviewed:  Chart reviewed. Pt has received his [employer] wound vac in room. Pt will need ambulance transport to home. Lives with sister, [name] # [phone]. NCM spoke to pt and sister will come and pick [employer] wound vac supplies/box. Pt's sister does not get off work until 4 pm today. [place], [place] #[phone]. States they will do start of care on 05/23/2016 in am. Pt will receive Vancomycin 3 pm dose and Zosyn 8 pm dose at hospital. Faxed IV abx Rx to [facility] pharmacy. Contacted [place] Liaison to make aware. [facility] pharmacy will supply medication.   PCP [name] MD   Expected Discharge Date:  05/22/2016              Expected Discharge Plan:  [place]  In-House Referral:  NA  Discharge planning Services  CM Consult  Post Acute Care Choice:  Durable Medical Equipment, Home Health Choice offered to:  Patient  DME Arranged:  Vac DME Agency:  [facility]  HH Arranged:  RN [place] Agency:  [facility]  Status of Service:  Completed, signed off  If discussed at [place] of Stay Meetings, dates discussed:    Additional Comments:  Erenest Rasher, RN 05/22/2016, 11:31 AM

## 2016-05-22 NOTE — Progress Notes (Signed)
Spoke with EMS(Christina) in regards to transporting pt. with his supplies to [address]. She states that it will not be a problem to transport supplies.

## 2016-05-22 NOTE — Progress Notes (Signed)
Pt family friend in the room. She agrees to take supplies home for patient. Ammie Ferrier [phone]...Marland KitchenMarland KitchenMarland Kitchen
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
## 2016-05-22 NOTE — Progress Notes (Signed)
Pt changed to home wound vac for transport home. Friend Ms. Tamala Julian took the patients personal belongings including a laptop, beats by dre, and other adaptive items with patient consent. Report geven to oncoming shift

## 2016-05-22 NOTE — Progress Notes (Signed)
Discharge transportation via [place]. Patient was in stable condition and report was given. A friend of the patient took most of the patient's belongings per his request. His book bag, wound vac and wound vac materials went with the patient. Discharge information was reviewed with the patient.

## 2016-05-23 DIAGNOSIS — A4102 Sepsis due to Methicillin resistant Staphylococcus aureus: Secondary | ICD-10-CM | POA: Diagnosis not present

## 2016-05-23 DIAGNOSIS — A419 Sepsis, unspecified organism: Secondary | ICD-10-CM | POA: Diagnosis not present

## 2016-05-23 DIAGNOSIS — N390 Urinary tract infection, site not specified: Secondary | ICD-10-CM | POA: Diagnosis not present

## 2016-05-23 DIAGNOSIS — B965 Pseudomonas (aeruginosa) (mallei) (pseudomallei) as the cause of diseases classified elsewhere: Secondary | ICD-10-CM | POA: Diagnosis not present

## 2016-05-23 DIAGNOSIS — M8638 Chronic multifocal osteomyelitis, other site: Secondary | ICD-10-CM | POA: Diagnosis not present

## 2016-05-23 DIAGNOSIS — A4189 Other specified sepsis: Secondary | ICD-10-CM | POA: Diagnosis not present

## 2016-05-23 DIAGNOSIS — G8253 Quadriplegia, C5-C7 complete: Secondary | ICD-10-CM | POA: Diagnosis not present

## 2016-05-23 DIAGNOSIS — L8990 Pressure ulcer of unspecified site, unspecified stage: Secondary | ICD-10-CM | POA: Diagnosis not present

## 2016-05-24 LAB — AEROBIC CULTURE W GRAM STAIN (SUPERFICIAL SPECIMEN)

## 2016-05-24 LAB — ANAEROBIC CULTURE

## 2016-05-24 LAB — AEROBIC CULTURE  (SUPERFICIAL SPECIMEN)

## 2016-05-27 DIAGNOSIS — Z7409 Other reduced mobility: Secondary | ICD-10-CM | POA: Diagnosis not present

## 2016-05-27 DIAGNOSIS — L89154 Pressure ulcer of sacral region, stage 4: Secondary | ICD-10-CM | POA: Diagnosis not present

## 2016-05-28 DIAGNOSIS — A4189 Other specified sepsis: Secondary | ICD-10-CM | POA: Diagnosis not present

## 2016-05-28 DIAGNOSIS — L8990 Pressure ulcer of unspecified site, unspecified stage: Secondary | ICD-10-CM | POA: Diagnosis not present

## 2016-05-28 DIAGNOSIS — M8638 Chronic multifocal osteomyelitis, other site: Secondary | ICD-10-CM | POA: Diagnosis not present

## 2016-05-28 DIAGNOSIS — A4102 Sepsis due to Methicillin resistant Staphylococcus aureus: Secondary | ICD-10-CM | POA: Diagnosis not present

## 2016-05-28 DIAGNOSIS — G8253 Quadriplegia, C5-C7 complete: Secondary | ICD-10-CM | POA: Diagnosis not present

## 2016-05-28 DIAGNOSIS — B965 Pseudomonas (aeruginosa) (mallei) (pseudomallei) as the cause of diseases classified elsewhere: Secondary | ICD-10-CM | POA: Diagnosis not present

## 2016-05-28 DIAGNOSIS — A419 Sepsis, unspecified organism: Secondary | ICD-10-CM | POA: Diagnosis not present

## 2016-05-28 DIAGNOSIS — N390 Urinary tract infection, site not specified: Secondary | ICD-10-CM | POA: Diagnosis not present

## 2016-05-31 DIAGNOSIS — M8638 Chronic multifocal osteomyelitis, other site: Secondary | ICD-10-CM | POA: Diagnosis not present

## 2016-05-31 DIAGNOSIS — L89154 Pressure ulcer of sacral region, stage 4: Secondary | ICD-10-CM | POA: Diagnosis not present

## 2016-05-31 DIAGNOSIS — B965 Pseudomonas (aeruginosa) (mallei) (pseudomallei) as the cause of diseases classified elsewhere: Secondary | ICD-10-CM | POA: Diagnosis not present

## 2016-05-31 DIAGNOSIS — G8253 Quadriplegia, C5-C7 complete: Secondary | ICD-10-CM | POA: Diagnosis not present

## 2016-05-31 DIAGNOSIS — A4189 Other specified sepsis: Secondary | ICD-10-CM | POA: Diagnosis not present

## 2016-06-05 DIAGNOSIS — B965 Pseudomonas (aeruginosa) (mallei) (pseudomallei) as the cause of diseases classified elsewhere: Secondary | ICD-10-CM | POA: Diagnosis not present

## 2016-06-05 DIAGNOSIS — M8638 Chronic multifocal osteomyelitis, other site: Secondary | ICD-10-CM | POA: Diagnosis not present

## 2016-06-05 DIAGNOSIS — A4102 Sepsis due to Methicillin resistant Staphylococcus aureus: Secondary | ICD-10-CM | POA: Diagnosis not present

## 2016-06-05 DIAGNOSIS — A4189 Other specified sepsis: Secondary | ICD-10-CM | POA: Diagnosis not present

## 2016-06-05 DIAGNOSIS — G8253 Quadriplegia, C5-C7 complete: Secondary | ICD-10-CM | POA: Diagnosis not present

## 2016-06-05 DIAGNOSIS — N390 Urinary tract infection, site not specified: Secondary | ICD-10-CM | POA: Diagnosis not present

## 2016-06-05 DIAGNOSIS — A419 Sepsis, unspecified organism: Secondary | ICD-10-CM | POA: Diagnosis not present

## 2016-06-05 DIAGNOSIS — L8990 Pressure ulcer of unspecified site, unspecified stage: Secondary | ICD-10-CM | POA: Diagnosis not present

## 2016-06-06 ENCOUNTER — Encounter: Payer: Self-pay | Admitting: Infectious Diseases

## 2016-06-06 DIAGNOSIS — A4189 Other specified sepsis: Secondary | ICD-10-CM | POA: Diagnosis not present

## 2016-06-06 DIAGNOSIS — M8638 Chronic multifocal osteomyelitis, other site: Secondary | ICD-10-CM | POA: Diagnosis not present

## 2016-06-10 ENCOUNTER — Encounter (HOSPITAL_BASED_OUTPATIENT_CLINIC_OR_DEPARTMENT_OTHER): Payer: Medicare Other | Attending: Internal Medicine

## 2016-06-10 DIAGNOSIS — M8668 Other chronic osteomyelitis, other site: Secondary | ICD-10-CM | POA: Insufficient documentation

## 2016-06-10 DIAGNOSIS — L89323 Pressure ulcer of left buttock, stage 3: Secondary | ICD-10-CM | POA: Insufficient documentation

## 2016-06-10 DIAGNOSIS — L89154 Pressure ulcer of sacral region, stage 4: Secondary | ICD-10-CM | POA: Insufficient documentation

## 2016-06-10 DIAGNOSIS — L89153 Pressure ulcer of sacral region, stage 3: Secondary | ICD-10-CM | POA: Diagnosis not present

## 2016-06-10 DIAGNOSIS — B9620 Unspecified Escherichia coli [E. coli] as the cause of diseases classified elsewhere: Secondary | ICD-10-CM | POA: Insufficient documentation

## 2016-06-10 DIAGNOSIS — G4730 Sleep apnea, unspecified: Secondary | ICD-10-CM | POA: Diagnosis not present

## 2016-06-10 DIAGNOSIS — L89314 Pressure ulcer of right buttock, stage 4: Secondary | ICD-10-CM | POA: Insufficient documentation

## 2016-06-10 DIAGNOSIS — L89103 Pressure ulcer of unspecified part of back, stage 3: Secondary | ICD-10-CM | POA: Diagnosis not present

## 2016-06-10 DIAGNOSIS — B965 Pseudomonas (aeruginosa) (mallei) (pseudomallei) as the cause of diseases classified elsewhere: Secondary | ICD-10-CM | POA: Insufficient documentation

## 2016-06-10 DIAGNOSIS — G8250 Quadriplegia, unspecified: Secondary | ICD-10-CM | POA: Insufficient documentation

## 2016-06-10 DIAGNOSIS — I10 Essential (primary) hypertension: Secondary | ICD-10-CM | POA: Diagnosis not present

## 2016-06-10 DIAGNOSIS — L89893 Pressure ulcer of other site, stage 3: Secondary | ICD-10-CM | POA: Diagnosis not present

## 2016-06-12 DIAGNOSIS — A4102 Sepsis due to Methicillin resistant Staphylococcus aureus: Secondary | ICD-10-CM | POA: Diagnosis not present

## 2016-06-12 DIAGNOSIS — A4189 Other specified sepsis: Secondary | ICD-10-CM | POA: Diagnosis not present

## 2016-06-12 DIAGNOSIS — N390 Urinary tract infection, site not specified: Secondary | ICD-10-CM | POA: Diagnosis not present

## 2016-06-12 DIAGNOSIS — B965 Pseudomonas (aeruginosa) (mallei) (pseudomallei) as the cause of diseases classified elsewhere: Secondary | ICD-10-CM | POA: Diagnosis not present

## 2016-06-12 DIAGNOSIS — G8253 Quadriplegia, C5-C7 complete: Secondary | ICD-10-CM | POA: Diagnosis not present

## 2016-06-12 DIAGNOSIS — L8990 Pressure ulcer of unspecified site, unspecified stage: Secondary | ICD-10-CM | POA: Diagnosis not present

## 2016-06-12 DIAGNOSIS — A419 Sepsis, unspecified organism: Secondary | ICD-10-CM | POA: Diagnosis not present

## 2016-06-12 DIAGNOSIS — M8638 Chronic multifocal osteomyelitis, other site: Secondary | ICD-10-CM | POA: Diagnosis not present

## 2016-06-13 DIAGNOSIS — G8253 Quadriplegia, C5-C7 complete: Secondary | ICD-10-CM | POA: Diagnosis not present

## 2016-06-13 DIAGNOSIS — A4189 Other specified sepsis: Secondary | ICD-10-CM | POA: Diagnosis not present

## 2016-06-13 DIAGNOSIS — M8638 Chronic multifocal osteomyelitis, other site: Secondary | ICD-10-CM | POA: Diagnosis not present

## 2016-06-13 DIAGNOSIS — B965 Pseudomonas (aeruginosa) (mallei) (pseudomallei) as the cause of diseases classified elsewhere: Secondary | ICD-10-CM | POA: Diagnosis not present

## 2016-06-14 ENCOUNTER — Emergency Department (HOSPITAL_COMMUNITY)
Admission: EM | Admit: 2016-06-14 | Discharge: 2016-06-14 | Disposition: A | Discharge status: Home / Self Care | Payer: Medicare Other | Attending: Emergency Medicine | Admitting: Emergency Medicine

## 2016-06-14 ENCOUNTER — Encounter (HOSPITAL_COMMUNITY): Payer: Self-pay

## 2016-06-14 ENCOUNTER — Telehealth: Payer: Self-pay | Admitting: Pharmacist

## 2016-06-14 DIAGNOSIS — T368X5A Adverse effect of other systemic antibiotics, initial encounter: Secondary | ICD-10-CM | POA: Diagnosis not present

## 2016-06-14 DIAGNOSIS — N179 Acute kidney failure, unspecified: Secondary | ICD-10-CM | POA: Insufficient documentation

## 2016-06-14 DIAGNOSIS — Y829 Unspecified medical devices associated with adverse incidents: Secondary | ICD-10-CM | POA: Insufficient documentation

## 2016-06-14 DIAGNOSIS — I10 Essential (primary) hypertension: Secondary | ICD-10-CM | POA: Insufficient documentation

## 2016-06-14 DIAGNOSIS — R7989 Other specified abnormal findings of blood chemistry: Secondary | ICD-10-CM | POA: Insufficient documentation

## 2016-06-14 DIAGNOSIS — Z79899 Other long term (current) drug therapy: Secondary | ICD-10-CM | POA: Diagnosis not present

## 2016-06-14 DIAGNOSIS — R404 Transient alteration of awareness: Secondary | ICD-10-CM | POA: Diagnosis not present

## 2016-06-14 DIAGNOSIS — R531 Weakness: Secondary | ICD-10-CM | POA: Diagnosis not present

## 2016-06-14 DIAGNOSIS — T887XXA Unspecified adverse effect of drug or medicament, initial encounter: Secondary | ICD-10-CM | POA: Diagnosis not present

## 2016-06-14 LAB — CBC WITH DIFFERENTIAL/PLATELET
Basophils Absolute: 0.1 10*3/uL (ref 0.0–0.1)
Basophils Relative: 1 %
EOS PCT: 15 %
Eosinophils Absolute: 1.7 10*3/uL — ABNORMAL HIGH (ref 0.0–0.7)
HEMATOCRIT: 28.6 % — AB (ref 39.0–52.0)
HEMOGLOBIN: 8.7 g/dL — AB (ref 13.0–17.0)
LYMPHS ABS: 1.6 10*3/uL (ref 0.7–4.0)
LYMPHS PCT: 15 %
MCH: 24.1 pg — AB (ref 26.0–34.0)
MCHC: 30.4 g/dL (ref 30.0–36.0)
MCV: 79.2 fL (ref 78.0–100.0)
Monocytes Absolute: 0.9 10*3/uL (ref 0.1–1.0)
Monocytes Relative: 8 %
Neutro Abs: 6.8 10*3/uL (ref 1.7–7.7)
Neutrophils Relative %: 61 %
PLATELETS: 400 10*3/uL (ref 150–400)
RBC: 3.61 MIL/uL — AB (ref 4.22–5.81)
RDW: 18.2 % — ABNORMAL HIGH (ref 11.5–15.5)
WBC: 11.1 10*3/uL — AB (ref 4.0–10.5)

## 2016-06-14 LAB — I-STAT CHEM 8, ED
BUN: 31 mg/dL — ABNORMAL HIGH (ref 6–20)
CHLORIDE: 112 mmol/L — AB (ref 101–111)
Calcium, Ion: 1.18 mmol/L (ref 1.15–1.40)
Creatinine, Ser: 1.9 mg/dL — ABNORMAL HIGH (ref 0.61–1.24)
GLUCOSE: 97 mg/dL (ref 65–99)
HEMATOCRIT: 27 % — AB (ref 39.0–52.0)
Hemoglobin: 9.2 g/dL — ABNORMAL LOW (ref 13.0–17.0)
POTASSIUM: 4.1 mmol/L (ref 3.5–5.1)
Sodium: 145 mmol/L (ref 135–145)
TCO2: 25 mmol/L (ref 0–100)

## 2016-06-14 MED ORDER — SODIUM CHLORIDE 0.9 % IV BOLUS (SEPSIS)
1000.0000 mL | Freq: Once | INTRAVENOUS | Status: AC
  Administered 2016-06-14: 1000 mL via INTRAVENOUS

## 2016-06-14 MED ORDER — SODIUM CHLORIDE 0.9% FLUSH: 10.0000 mL | INTRAVENOUS | Status: DC | PRN

## 2016-06-14 NOTE — Telephone Encounter (Signed)
Received a phone call from [facility] pharmacist, Amy.  [name] [name] is on IV Vanc + Zosyn for pelvic osteomyelitis.  He was discharged on 1 gm q12 and VT on this dose was 12 and his SCr was 0.3.  His dose was then increased to 1250 IV q12h with a VT of 8.3 and SCr 0.46.  He told Amy he was taking all of his doses and missing none.  He was then increased to 1750 mg IV q12h --> resulted in VT of 54 (drawn 2 hours late and NOT while it was infusing), SCr 1.5.  Called patient and directed him to go to the ED since his SCr bumped up 5x. He will be seen at [facility]. Plan and information was discussed with Dr. Carlyle Basques, ID MD.
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
## 2016-06-14 NOTE — ED Notes (Signed)
EDP at bedside  

## 2016-06-14 NOTE — ED Notes (Signed)
Picc line not pulling back. Will flush, but resistant.

## 2016-06-14 NOTE — ED Notes (Signed)
IV RN able to get blood return. Samples sent to lab at this time.
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
## 2016-06-15 ENCOUNTER — Telehealth: Payer: Self-pay | Admitting: Pharmacist

## 2016-06-15 ENCOUNTER — Inpatient Hospital Stay: Payer: Medicare Other | Admitting: Infectious Diseases

## 2016-06-15 NOTE — Telephone Encounter (Signed)
Peirce called me this morning to tell me that he was released from the ED last night.  SCr was 1.9 when checked in ED. He received fluids.  After talking to Dr. Baxter Flattery and [name], we will stop his IV abx altogether since his stop date was early next week.  Will keep his PICC in at least through tomorrow to recheck BMET and VT tomorrow AM. He sees [name] on 2/13.

## 2016-06-16 DIAGNOSIS — B9620 Unspecified Escherichia coli [E. coli] as the cause of diseases classified elsewhere: Secondary | ICD-10-CM | POA: Diagnosis not present

## 2016-06-16 DIAGNOSIS — M8638 Chronic multifocal osteomyelitis, other site: Secondary | ICD-10-CM | POA: Diagnosis not present

## 2016-06-17 ENCOUNTER — Inpatient Hospital Stay (HOSPITAL_COMMUNITY)
Admission: EM | Admit: 2016-06-17 | Discharge: 2016-06-20 | DRG: 682 | Disposition: A | Discharge status: Home / Self Care | Payer: Medicare Other | Attending: Internal Medicine | Admitting: Internal Medicine

## 2016-06-17 ENCOUNTER — Observation Stay (HOSPITAL_COMMUNITY): Payer: Medicare Other

## 2016-06-17 ENCOUNTER — Encounter (HOSPITAL_COMMUNITY): Payer: Self-pay

## 2016-06-17 ENCOUNTER — Telehealth: Payer: Self-pay | Admitting: *Deleted

## 2016-06-17 ENCOUNTER — Ambulatory Visit: Payer: Medicare Other

## 2016-06-17 ENCOUNTER — Other Ambulatory Visit: Payer: Medicare Other

## 2016-06-17 ENCOUNTER — Telehealth: Payer: Self-pay | Admitting: Internal Medicine

## 2016-06-17 DIAGNOSIS — D638 Anemia in other chronic diseases classified elsewhere: Secondary | ICD-10-CM | POA: Diagnosis present

## 2016-06-17 DIAGNOSIS — L89893 Pressure ulcer of other site, stage 3: Secondary | ICD-10-CM | POA: Diagnosis present

## 2016-06-17 DIAGNOSIS — Z833 Family history of diabetes mellitus: Secondary | ICD-10-CM

## 2016-06-17 DIAGNOSIS — F329 Major depressive disorder, single episode, unspecified: Secondary | ICD-10-CM | POA: Diagnosis present

## 2016-06-17 DIAGNOSIS — L89214 Pressure ulcer of right hip, stage 4: Secondary | ICD-10-CM | POA: Diagnosis present

## 2016-06-17 DIAGNOSIS — Z803 Family history of malignant neoplasm of breast: Secondary | ICD-10-CM | POA: Diagnosis not present

## 2016-06-17 DIAGNOSIS — N179 Acute kidney failure, unspecified: Principal | ICD-10-CM | POA: Diagnosis present

## 2016-06-17 DIAGNOSIS — Z79899 Other long term (current) drug therapy: Secondary | ICD-10-CM

## 2016-06-17 DIAGNOSIS — J9610 Chronic respiratory failure, unspecified whether with hypoxia or hypercapnia: Secondary | ICD-10-CM | POA: Diagnosis not present

## 2016-06-17 DIAGNOSIS — G40909 Epilepsy, unspecified, not intractable, without status epilepticus: Secondary | ICD-10-CM | POA: Diagnosis present

## 2016-06-17 DIAGNOSIS — G4733 Obstructive sleep apnea (adult) (pediatric): Secondary | ICD-10-CM | POA: Diagnosis not present

## 2016-06-17 DIAGNOSIS — Z981 Arthrodesis status: Secondary | ICD-10-CM

## 2016-06-17 DIAGNOSIS — Z9989 Dependence on other enabling machines and devices: Secondary | ICD-10-CM | POA: Diagnosis not present

## 2016-06-17 DIAGNOSIS — R030 Elevated blood-pressure reading, without diagnosis of hypertension: Secondary | ICD-10-CM | POA: Diagnosis not present

## 2016-06-17 DIAGNOSIS — Z933 Colostomy status: Secondary | ICD-10-CM

## 2016-06-17 DIAGNOSIS — T368X5A Adverse effect of other systemic antibiotics, initial encounter: Secondary | ICD-10-CM | POA: Diagnosis not present

## 2016-06-17 DIAGNOSIS — Z8711 Personal history of peptic ulcer disease: Secondary | ICD-10-CM

## 2016-06-17 DIAGNOSIS — Z87440 Personal history of urinary (tract) infections: Secondary | ICD-10-CM

## 2016-06-17 DIAGNOSIS — L89154 Pressure ulcer of sacral region, stage 4: Secondary | ICD-10-CM | POA: Diagnosis not present

## 2016-06-17 DIAGNOSIS — K219 Gastro-esophageal reflux disease without esophagitis: Secondary | ICD-10-CM | POA: Diagnosis present

## 2016-06-17 DIAGNOSIS — L89224 Pressure ulcer of left hip, stage 4: Secondary | ICD-10-CM | POA: Diagnosis not present

## 2016-06-17 DIAGNOSIS — E662 Morbid (severe) obesity with alveolar hypoventilation: Secondary | ICD-10-CM | POA: Diagnosis present

## 2016-06-17 DIAGNOSIS — G8250 Quadriplegia, unspecified: Secondary | ICD-10-CM | POA: Diagnosis not present

## 2016-06-17 DIAGNOSIS — Z8701 Personal history of pneumonia (recurrent): Secondary | ICD-10-CM

## 2016-06-17 DIAGNOSIS — Z792 Long term (current) use of antibiotics: Secondary | ICD-10-CM

## 2016-06-17 DIAGNOSIS — E860 Dehydration: Secondary | ICD-10-CM | POA: Diagnosis not present

## 2016-06-17 DIAGNOSIS — N289 Disorder of kidney and ureter, unspecified: Secondary | ICD-10-CM | POA: Diagnosis not present

## 2016-06-17 DIAGNOSIS — R7989 Other specified abnormal findings of blood chemistry: Secondary | ICD-10-CM | POA: Diagnosis present

## 2016-06-17 DIAGNOSIS — I739 Peripheral vascular disease, unspecified: Secondary | ICD-10-CM | POA: Diagnosis present

## 2016-06-17 DIAGNOSIS — I10 Essential (primary) hypertension: Secondary | ICD-10-CM | POA: Diagnosis present

## 2016-06-17 DIAGNOSIS — Z888 Allergy status to other drugs, medicaments and biological substances status: Secondary | ICD-10-CM

## 2016-06-17 DIAGNOSIS — R6520 Severe sepsis without septic shock: Secondary | ICD-10-CM | POA: Diagnosis not present

## 2016-06-17 LAB — I-STAT CHEM 8, ED
BUN: 33 mg/dL — ABNORMAL HIGH (ref 6–20)
CALCIUM ION: 1.17 mmol/L (ref 1.15–1.40)
CHLORIDE: 114 mmol/L — AB (ref 101–111)
Creatinine, Ser: 1.6 mg/dL — ABNORMAL HIGH (ref 0.61–1.24)
GLUCOSE: 115 mg/dL — AB (ref 65–99)
HCT: 25 % — ABNORMAL LOW (ref 39.0–52.0)
HEMOGLOBIN: 8.5 g/dL — AB (ref 13.0–17.0)
Potassium: 4 mmol/L (ref 3.5–5.1)
SODIUM: 147 mmol/L — AB (ref 135–145)
TCO2: 24 mmol/L (ref 0–100)

## 2016-06-17 LAB — CBC
HCT: 26 % — ABNORMAL LOW (ref 39.0–52.0)
HEMOGLOBIN: 7.9 g/dL — AB (ref 13.0–17.0)
MCH: 24.6 pg — ABNORMAL LOW (ref 26.0–34.0)
MCHC: 30.4 g/dL (ref 30.0–36.0)
MCV: 81 fL (ref 78.0–100.0)
Platelets: 377 10*3/uL (ref 150–400)
RBC: 3.21 MIL/uL — ABNORMAL LOW (ref 4.22–5.81)
RDW: 18.5 % — AB (ref 11.5–15.5)
WBC: 9.9 10*3/uL (ref 4.0–10.5)

## 2016-06-17 LAB — CBC WITH DIFFERENTIAL/PLATELET
BASOS ABS: 0 10*3/uL (ref 0.0–0.1)
BASOS PCT: 1 %
EOS ABS: 0.3 10*3/uL (ref 0.0–0.7)
EOS PCT: 8 %
HCT: 51.3 % (ref 39.0–52.0)
HEMOGLOBIN: 15.8 g/dL (ref 13.0–17.0)
LYMPHS ABS: 0.7 10*3/uL (ref 0.7–4.0)
Lymphocytes Relative: 18 %
MCH: 24.7 pg — AB (ref 26.0–34.0)
MCHC: 30.8 g/dL (ref 30.0–36.0)
MCV: 80.2 fL (ref 78.0–100.0)
Monocytes Absolute: 0.3 10*3/uL (ref 0.1–1.0)
Monocytes Relative: 6 %
NEUTROS PCT: 67 %
Neutro Abs: 2.7 10*3/uL (ref 1.7–7.7)
PLATELETS: 206 10*3/uL (ref 150–400)
RBC: 6.4 MIL/uL — AB (ref 4.22–5.81)
RDW: 18.2 % — ABNORMAL HIGH (ref 11.5–15.5)
WBC: 4.1 10*3/uL (ref 4.0–10.5)

## 2016-06-17 MED ORDER — ADULT MULTIVITAMIN W/MINERALS CH
1.0000 | ORAL_TABLET | Freq: Every morning | ORAL | Status: DC
  Administered 2016-06-18 – 2016-06-20 (×3): 1 via ORAL
  Filled 2016-06-17 (×3): qty 1

## 2016-06-17 MED ORDER — PANTOPRAZOLE SODIUM 40 MG PO TBEC
40.0000 mg | DELAYED_RELEASE_TABLET | Freq: Two times a day (BID) | ORAL | Status: DC
  Administered 2016-06-18 – 2016-06-20 (×6): 40 mg via ORAL
  Filled 2016-06-17 (×6): qty 1

## 2016-06-17 MED ORDER — METOCLOPRAMIDE HCL 10 MG PO TABS
10.0000 mg | ORAL_TABLET | Freq: Three times a day (TID) | ORAL | Status: DC
  Administered 2016-06-17 – 2016-06-20 (×12): 10 mg via ORAL
  Filled 2016-06-17 (×12): qty 1

## 2016-06-17 MED ORDER — SUCRALFATE 1 G PO TABS
1.0000 g | ORAL_TABLET | Freq: Three times a day (TID) | ORAL | Status: DC
  Administered 2016-06-18 – 2016-06-20 (×11): 1 g via ORAL
  Filled 2016-06-17 (×11): qty 1

## 2016-06-17 MED ORDER — DOCUSATE SODIUM 100 MG PO CAPS
200.0000 mg | ORAL_CAPSULE | Freq: Two times a day (BID) | ORAL | Status: DC
  Administered 2016-06-17 – 2016-06-20 (×6): 200 mg via ORAL
  Filled 2016-06-17 (×6): qty 2

## 2016-06-17 MED ORDER — JUVEN PO PACK
1.0000 | PACK | Freq: Two times a day (BID) | ORAL | Status: DC
  Administered 2016-06-18 – 2016-06-20 (×5): 1 via ORAL
  Filled 2016-06-17 (×6): qty 1

## 2016-06-17 MED ORDER — SODIUM CHLORIDE 0.9 % IV BOLUS (SEPSIS)
2000.0000 mL | Freq: Once | INTRAVENOUS | Status: AC
  Administered 2016-06-17: 2000 mL via INTRAVENOUS

## 2016-06-17 MED ORDER — ACETAMINOPHEN 325 MG PO TABS: 650.0000 mg | ORAL_TABLET | Freq: Four times a day (QID) | ORAL | Status: DC | PRN

## 2016-06-17 MED ORDER — MAGNESIUM OXIDE 400 (241.3 MG) MG PO TABS
400.0000 mg | ORAL_TABLET | Freq: Every day | ORAL | Status: DC
  Administered 2016-06-18 – 2016-06-20 (×3): 400 mg via ORAL
  Filled 2016-06-17 (×3): qty 1

## 2016-06-17 MED ORDER — SODIUM CHLORIDE 0.9% FLUSH
10.0000 mL | INTRAVENOUS | Status: DC | PRN
  Administered 2016-06-20: 10 mL
  Filled 2016-06-17: qty 40

## 2016-06-17 MED ORDER — DARIFENACIN HYDROBROMIDE ER 15 MG PO TB24
15.0000 mg | ORAL_TABLET | Freq: Every day | ORAL | Status: DC
  Administered 2016-06-18 – 2016-06-20 (×3): 15 mg via ORAL
  Filled 2016-06-17 (×3): qty 1

## 2016-06-17 MED ORDER — ZINC SULFATE 220 (50 ZN) MG PO CAPS
220.0000 mg | ORAL_CAPSULE | Freq: Two times a day (BID) | ORAL | Status: DC
  Administered 2016-06-17 – 2016-06-20 (×6): 220 mg via ORAL
  Filled 2016-06-17 (×6): qty 1

## 2016-06-17 MED ORDER — BACLOFEN 10 MG PO TABS
20.0000 mg | ORAL_TABLET | Freq: Four times a day (QID) | ORAL | Status: DC
  Administered 2016-06-17 – 2016-06-20 (×12): 20 mg via ORAL
  Filled 2016-06-17 (×13): qty 2

## 2016-06-17 MED ORDER — FERROUS SULFATE 325 (65 FE) MG PO TABS
325.0000 mg | ORAL_TABLET | Freq: Three times a day (TID) | ORAL | Status: DC
Start: 2016-06-18 — End: 2016-06-20
  Administered 2016-06-18 – 2016-06-20 (×9): 325 mg via ORAL
  Filled 2016-06-17 (×9): qty 1

## 2016-06-17 MED ORDER — SODIUM CHLORIDE 0.9 % IV SOLN
INTRAVENOUS | Status: DC
  Administered 2016-06-17 – 2016-06-20 (×3): via INTRAVENOUS

## 2016-06-17 MED ORDER — HEPARIN SODIUM (PORCINE) 5000 UNIT/ML IJ SOLN
5000.0000 [IU] | Freq: Three times a day (TID) | INTRAMUSCULAR | Status: DC
  Administered 2016-06-18 – 2016-06-20 (×8): 5000 [IU] via SUBCUTANEOUS
  Filled 2016-06-17 (×8): qty 1
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
## 2016-06-17 NOTE — ED Notes (Signed)
Pt given sprite to drink. 

## 2016-06-17 NOTE — ED Triage Notes (Addendum)
Per EMS - pt from home, has home health. Pt here for elevated creatinine levels. PICC line right upper arm. Hx quadriplegia, htn. BP 182/86. Pt was taking vancomycin through PICC line for infection on back, stopped Tuesday.

## 2016-06-17 NOTE — Telephone Encounter (Signed)
Thank you :)

## 2016-06-17 NOTE — Telephone Encounter (Signed)
Patient has had recent AKI due to supratherapeutic vancomycin. It was detected earlier in the week, patient has been increasing  his oral intake but his kidney function has not improved. I recommended to go to [facility] ED for admission and evaluation of his aki.  He last took vancomycin on Tuesday morning and last dose of piptazo on Tuesday afternoon when he was instructed to stop all abtx for his  Osteomyelitis  Labs drawn from home health yesterday still show aki at cr 1.69( his BL is 0.4) and vanco random 46 (48hr after his last dose). He previously was recommendedTo go to the ED earlier in the week but was not admitted at that time (though his cr was 1.9) for unclear reasons.

## 2016-06-17 NOTE — Telephone Encounter (Signed)
Patient called to ask if the doctor or pharmacist could call his sister to explain to her why he needs fluids. She has concerns because he was just in the ED and they sent him home saying everything was fine. He is concerned because she will not get off work and Dr Baxter Flattery told him he needs fluids as soon as possible. He advised he may need for the doctor to call home health as she suggested because the EMS has a hard time getting him out of the bed. Advised him will get a message to the doctor and pharmacist to see if anyone can give his sister [name] [name] a call at [phone] to explain.
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
## 2016-06-18 ENCOUNTER — Observation Stay (HOSPITAL_COMMUNITY): Payer: Medicare Other

## 2016-06-18 ENCOUNTER — Other Ambulatory Visit (HOSPITAL_COMMUNITY): Payer: Medicare Other

## 2016-06-18 DIAGNOSIS — E860 Dehydration: Secondary | ICD-10-CM | POA: Diagnosis present

## 2016-06-18 DIAGNOSIS — Z803 Family history of malignant neoplasm of breast: Secondary | ICD-10-CM | POA: Diagnosis not present

## 2016-06-18 DIAGNOSIS — T368X5A Adverse effect of other systemic antibiotics, initial encounter: Secondary | ICD-10-CM | POA: Diagnosis present

## 2016-06-18 DIAGNOSIS — Z933 Colostomy status: Secondary | ICD-10-CM | POA: Diagnosis not present

## 2016-06-18 DIAGNOSIS — R6520 Severe sepsis without septic shock: Secondary | ICD-10-CM | POA: Diagnosis not present

## 2016-06-18 DIAGNOSIS — Z9989 Dependence on other enabling machines and devices: Secondary | ICD-10-CM

## 2016-06-18 DIAGNOSIS — G8250 Quadriplegia, unspecified: Secondary | ICD-10-CM

## 2016-06-18 DIAGNOSIS — G4733 Obstructive sleep apnea (adult) (pediatric): Secondary | ICD-10-CM

## 2016-06-18 DIAGNOSIS — N179 Acute kidney failure, unspecified: Secondary | ICD-10-CM | POA: Diagnosis not present

## 2016-06-18 DIAGNOSIS — K219 Gastro-esophageal reflux disease without esophagitis: Secondary | ICD-10-CM | POA: Diagnosis present

## 2016-06-18 DIAGNOSIS — N289 Disorder of kidney and ureter, unspecified: Secondary | ICD-10-CM | POA: Diagnosis not present

## 2016-06-18 DIAGNOSIS — I10 Essential (primary) hypertension: Secondary | ICD-10-CM | POA: Diagnosis not present

## 2016-06-18 DIAGNOSIS — I739 Peripheral vascular disease, unspecified: Secondary | ICD-10-CM | POA: Diagnosis present

## 2016-06-18 DIAGNOSIS — Z833 Family history of diabetes mellitus: Secondary | ICD-10-CM | POA: Diagnosis not present

## 2016-06-18 DIAGNOSIS — F329 Major depressive disorder, single episode, unspecified: Secondary | ICD-10-CM | POA: Diagnosis present

## 2016-06-18 DIAGNOSIS — J9610 Chronic respiratory failure, unspecified whether with hypoxia or hypercapnia: Secondary | ICD-10-CM | POA: Diagnosis present

## 2016-06-18 DIAGNOSIS — L89154 Pressure ulcer of sacral region, stage 4: Secondary | ICD-10-CM

## 2016-06-18 DIAGNOSIS — G40909 Epilepsy, unspecified, not intractable, without status epilepticus: Secondary | ICD-10-CM | POA: Diagnosis present

## 2016-06-18 DIAGNOSIS — R7989 Other specified abnormal findings of blood chemistry: Secondary | ICD-10-CM | POA: Diagnosis present

## 2016-06-18 DIAGNOSIS — L89214 Pressure ulcer of right hip, stage 4: Secondary | ICD-10-CM | POA: Diagnosis present

## 2016-06-18 DIAGNOSIS — Z87440 Personal history of urinary (tract) infections: Secondary | ICD-10-CM | POA: Diagnosis not present

## 2016-06-18 DIAGNOSIS — L89224 Pressure ulcer of left hip, stage 4: Secondary | ICD-10-CM | POA: Diagnosis present

## 2016-06-18 DIAGNOSIS — L89893 Pressure ulcer of other site, stage 3: Secondary | ICD-10-CM | POA: Diagnosis present

## 2016-06-18 DIAGNOSIS — Z79899 Other long term (current) drug therapy: Secondary | ICD-10-CM | POA: Diagnosis not present

## 2016-06-18 DIAGNOSIS — Z8711 Personal history of peptic ulcer disease: Secondary | ICD-10-CM | POA: Diagnosis not present

## 2016-06-18 DIAGNOSIS — E662 Morbid (severe) obesity with alveolar hypoventilation: Secondary | ICD-10-CM | POA: Diagnosis present

## 2016-06-18 DIAGNOSIS — Z8701 Personal history of pneumonia (recurrent): Secondary | ICD-10-CM | POA: Diagnosis not present

## 2016-06-18 DIAGNOSIS — Z981 Arthrodesis status: Secondary | ICD-10-CM | POA: Diagnosis not present

## 2016-06-18 LAB — CBC
HEMATOCRIT: 26.2 % — AB (ref 39.0–52.0)
Hemoglobin: 7.7 g/dL — ABNORMAL LOW (ref 13.0–17.0)
MCH: 24.1 pg — AB (ref 26.0–34.0)
MCHC: 29.4 g/dL — AB (ref 30.0–36.0)
MCV: 82.1 fL (ref 78.0–100.0)
Platelets: 345 10*3/uL (ref 150–400)
RBC: 3.19 MIL/uL — ABNORMAL LOW (ref 4.22–5.81)
RDW: 18.6 % — AB (ref 11.5–15.5)
WBC: 9.4 10*3/uL (ref 4.0–10.5)

## 2016-06-18 LAB — BASIC METABOLIC PANEL
Anion gap: 6 (ref 5–15)
BUN: 29 mg/dL — AB (ref 6–20)
CHLORIDE: 114 mmol/L — AB (ref 101–111)
CO2: 25 mmol/L (ref 22–32)
CREATININE: 1.4 mg/dL — AB (ref 0.61–1.24)
Calcium: 8.4 mg/dL — ABNORMAL LOW (ref 8.9–10.3)
GFR calc Af Amer: >60 mL/min (ref >60)
GFR calc non Af Amer: 58 mL/min — ABNORMAL LOW (ref >60)
GLUCOSE: 158 mg/dL — AB (ref 65–99)
POTASSIUM: 4.1 mmol/L (ref 3.5–5.1)
Sodium: 145 mmol/L (ref 135–145)

## 2016-06-18 LAB — URINALYSIS, ROUTINE W REFLEX MICROSCOPIC
Bilirubin Urine: NEGATIVE
Glucose, UA: NEGATIVE mg/dL
Ketones, ur: NEGATIVE mg/dL
Nitrite: NEGATIVE
PROTEIN: NEGATIVE mg/dL
SQUAMOUS EPITHELIAL / LPF: NONE SEEN
Specific Gravity, Urine: 1.005 (ref 1.005–1.030)
pH: 7 (ref 5.0–8.0)

## 2016-06-18 LAB — CREATININE, URINE, RANDOM

## 2016-06-18 LAB — CREATININE, SERUM
CREATININE: 1.5 mg/dL — AB (ref 0.61–1.24)
GFR calc non Af Amer: 53 mL/min — ABNORMAL LOW (ref >60)

## 2016-06-18 LAB — VANCOMYCIN, RANDOM: VANCOMYCIN RM: 33

## 2016-06-18 LAB — PROTEIN, URINE, RANDOM: TOTAL PROTEIN, URINE: 14 mg/dL

## 2016-06-18 NOTE — Care Management Obs Status (Signed)
[facility] NOTIFICATION   Patient Details  Name: [name] [name] MRN: [id number] Date of Birth: 31-Aug-1965   Medicare Observation Status Notification Given:  Yes    Ninfa Meeker, RN 06/18/2016, 9:48 AM
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
## 2016-06-19 LAB — BASIC METABOLIC PANEL
Anion gap: 8 (ref 5–15)
BUN: 34 mg/dL — ABNORMAL HIGH (ref 6–20)
CALCIUM: 8.5 mg/dL — AB (ref 8.9–10.3)
CO2: 24 mmol/L (ref 22–32)
CREATININE: 1.27 mg/dL — AB (ref 0.61–1.24)
Chloride: 109 mmol/L (ref 101–111)
GFR calc non Af Amer: >60 mL/min (ref >60)
GLUCOSE: 95 mg/dL (ref 65–99)
Potassium: 3.5 mmol/L (ref 3.5–5.1)
Sodium: 141 mmol/L (ref 135–145)

## 2016-06-19 MED ORDER — POTASSIUM CHLORIDE CRYS ER 20 MEQ PO TBCR
60.0000 meq | EXTENDED_RELEASE_TABLET | Freq: Once | ORAL | Status: AC
  Administered 2016-06-19: 60 meq via ORAL
  Filled 2016-06-19: qty 3
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
## 2016-06-20 LAB — BASIC METABOLIC PANEL
Anion gap: 3 — ABNORMAL LOW (ref 5–15)
BUN: 42 mg/dL — AB (ref 6–20)
CALCIUM: 8.5 mg/dL — AB (ref 8.9–10.3)
CO2: 26 mmol/L (ref 22–32)
CREATININE: 1.25 mg/dL — AB (ref 0.61–1.24)
Chloride: 112 mmol/L — ABNORMAL HIGH (ref 101–111)
GFR calc Af Amer: >60 mL/min (ref >60)
GFR calc non Af Amer: >60 mL/min (ref >60)
GLUCOSE: 94 mg/dL (ref 65–99)
Potassium: 4 mmol/L (ref 3.5–5.1)
Sodium: 141 mmol/L (ref 135–145)

## 2016-06-20 MED ORDER — ALTEPLASE 2 MG IJ SOLR
2.0000 mg | Freq: Once | INTRAMUSCULAR | Status: AC
  Administered 2016-06-20: 2 mg

## 2016-06-20 NOTE — Consult Note (Signed)
[place] Nurse wound consult note Reason for Consult: multiple pressure injuries; patient well known to the [facility] nurse team. Wound type:  Chronic Stage 4 Pressure Injury that involves the sacrum and bilateral ischium. Chronic moisture associated skin damage to the scrotum and penis from leaking SP cath. Colostomy for bowel diversion.  Wound type: Sacrum Stage 4 Pressure Injury: 20cm x 15cm x 0.5cm; clean, pink, some early granulation tissue Right ischium Stage 4 Pressure injury: 3cm x 3cm did not measure depth today; clean pink, non granular, bone in base  Right flank wound Stage 3 medical device related pressure injury: 4cm x 3.5cm x 0.5cm 10% yellow along medial wound edge; 90% pink Left medial calf Stage 3 Pressure injury: 3cm x 2.0cm x 0.5cm: 100% clean, early granulation tissue  Brasen's wounds look the best that I have seen in years, no real strong odor noted today.  Drainage is serosanguinous from all sites. Continues to have leakage from his SP catheter, but skin is much improved on the scrotum and penis. Pressure Injury POA: Yes   Dressing procedure/placement/frequency:  It was reported that he had a NPWT VAC in place at the time of admission.  The ED H&P indicated this was on the sacral wound and from the appearance of the wound bed it does seem this would be the site of the therapy, today the VAC has been removed and he has saline moist dressings in place.  The patient reports the [facility] was on his back wound.  I have talked with hospitalist and Mr. Elting will be discharged back to home today.  He does not have a charger for his home unit or canister.  Orders written to continue saline moist gauze to the sacral and ischial wounds and to the upper back wound.  HHRN to restart NPWT VAC therapy once patient DC back to home. Home machine is in patient belonging bag in his room.  Changed silicone foam to the left medial calf wound.   SPORT mattress in place for pressure redistribution.    Ostomy LLQ,  end diverting colostomy, new pouch intact, stool in pouch.  Patient is totally dependent for ostomy care.    Discussed POC with patient and bedside nurse.  Re consult if needed, will not follow at this time. Thanks  Tayten Heber [employer], RN,CWOCN, CNS [phone])
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
## 2016-06-20 NOTE — Care Management Note (Signed)
Case Management Note  Patient Details  Name: [name] [name] MRN: [id number] Date of Birth: [birth year]  Subjective/Objective:    51 yr old gentleman admitted with Acute Kidney Injury.  Patient is a quadraplegic from [place] 30 yrs ago.              Action/Plan: Case manager spoke with patient concerning which home health agency has been providing his care. CM contacted [facility] for resumption of care. Patient lives with his sister.     Expected Discharge Date:  06/20/16               Expected Discharge Plan:  [place]  In-House Referral:  NA  Discharge planning Services  CM Consult  Post Acute Care Choice:  Resumption of Svcs/PTA Provider, Home Health Choice offered to:  Patient  DME Arranged:  N/A DME Agency:     HH Arranged:  RN [place] Agency:  [facility]  Status of Service:  Completed, signed off  If discussed at [place] of Stay Meetings, dates discussed:    Additional Comments:  Ninfa Meeker, RN 06/20/2016, 2:37 PM

## 2016-06-20 NOTE — Progress Notes (Signed)
Pt ready for d/c home today per MD. Pt's PICC d/c by IV team. No issues with pt's chronic suprapubic catheter or colostomy. Discharge instructions reviewed with pt, all questions answered. Will call and review with pt's sister. [facility] arranged for pt to transport home. Belongings packed and will be sent with pt.   [place], Jerry Caras

## 2016-06-20 NOTE — Care Management (Signed)
Case manager arranged [facility] transport home for patient. His sister will be at the residence after 5:30pm. Kristine Royal, RN Case Manager

## 2016-06-21 ENCOUNTER — Emergency Department (HOSPITAL_COMMUNITY): Payer: Medicare Other

## 2016-06-21 ENCOUNTER — Emergency Department (HOSPITAL_COMMUNITY)
Admission: EM | Admit: 2016-06-21 | Discharge: 2016-06-22 | Disposition: A | Discharge status: Home / Self Care | Payer: Medicare Other | Attending: Physician Assistant | Admitting: Physician Assistant

## 2016-06-21 ENCOUNTER — Encounter (HOSPITAL_COMMUNITY): Payer: Self-pay | Admitting: Radiology

## 2016-06-21 DIAGNOSIS — G8250 Quadriplegia, unspecified: Secondary | ICD-10-CM | POA: Diagnosis not present

## 2016-06-21 DIAGNOSIS — R4182 Altered mental status, unspecified: Secondary | ICD-10-CM | POA: Diagnosis not present

## 2016-06-21 DIAGNOSIS — I10 Essential (primary) hypertension: Secondary | ICD-10-CM | POA: Diagnosis not present

## 2016-06-21 DIAGNOSIS — R410 Disorientation, unspecified: Secondary | ICD-10-CM | POA: Insufficient documentation

## 2016-06-21 DIAGNOSIS — Z79899 Other long term (current) drug therapy: Secondary | ICD-10-CM | POA: Insufficient documentation

## 2016-06-21 DIAGNOSIS — Z87898 Personal history of other specified conditions: Secondary | ICD-10-CM

## 2016-06-21 LAB — URINALYSIS, ROUTINE W REFLEX MICROSCOPIC
Bilirubin Urine: NEGATIVE
Glucose, UA: NEGATIVE mg/dL
Ketones, ur: NEGATIVE mg/dL
NITRITE: NEGATIVE
PH: 6 (ref 5.0–8.0)
Protein, ur: NEGATIVE mg/dL
SPECIFIC GRAVITY, URINE: 1.01 (ref 1.005–1.030)

## 2016-06-21 LAB — I-STAT CG4 LACTIC ACID, ED: LACTIC ACID, VENOUS: 0.58 mmol/L (ref 0.5–1.9)

## 2016-06-21 NOTE — ED Provider Notes (Addendum)
51yo M w/ extensive PMH including sacral decubitus ulcers, quadriplegia, colostomy, recent AKi who p/w Altered mental status. The patient was just discharged from the hospital yesterday after an admission for acute kidney failure thought to be related to vancomycin. His kidney function was improving at time of discharge.  Unfortunately patient's PICC line was pulled at the time of discharge by the hospitalist. There is no way to get new blood work on patient.   Nursing history 3, we've done an L art stick, R fem stick and attempt at US guided IV. Dscussed with PICC team, PICC team feels that they are unable to place PICC line in patient's given this times in the past. Discussed with IR at 6 pm. They do not feels appropriate to bring in the team at this time.  I don't think is appropriate to place central line and patient currently given that he has normal vital signs normal physical exam and normal mental status.  However patient will need and require IV access in the future, I think a more permanent solution needs to be established.The soonest solution for this would be tomorrow morning to have Dr. Earleen Newport with VIR placed a PICC line versus discussed with surgery for a permacath.  I attempted to call patient's primary care physician. Unfortunately a different primary care physician was on call for them and they're unable to contact with her.    In summary. have a 51 year old male who had recent AK I with no IV access, no ability to draw labs this time. However he has had no AMS here. He states his AMS was just a crying episode. In looking. He notes he has had previous "altered mental status" from unknown etiology. I further discussed wth aunt and she states he has had a clear head since discharge, that the confusion happened PRIOR to discharge.     7:22 PM Discussed wth hospitlist X2 about whether patient would meet inpatient or observation criteria.  Discussed the patient's family. Apparently  there has been a lot of back-and-forth and and unable to have a Port-A-Cath placed because infectious disease tells the primary do it and primary ctells infectious disease to do it.  I don't feel that I can admit patient just for establishing permanent IV access given this is not my decision to make. He really needs to have a primary care physician facilitate basement of either PICC line or surgical option.  Discussed this with aunt and both patient and I and nurse present for this conversation and agree that he will be discharged home. Collier Salina was called.  Patient remains asymptomatic with normal vitals. Will have to discharge home and have him follow up with PCP to work on access as an outpatient.  11:56 PM Unfortunately now since [facility] took 5 hours to be available patient's caretaker is no longer available. She is refusing to have patient come to the house and [facility] is refusing to bring him home. We will care for patient in our ED with plan to continue to discharge home. Patient states he occasionally has these episodes that he came in for.. However there is never been any altered mental status. Vital signs remained normal. Patient is still playing with cell phone and watching TV comfortably at this time.      Ly Wass Julio Alm, MD 06/21/16 2028    Rheda Kassab Julio Alm, MD 06/21/16 2101    Daizee Firmin Julio Alm, MD 06/22/16 [id number]

## 2016-06-21 NOTE — Discharge Instructions (Signed)
You were seen today for altered mental status. This is completely cleared. You have normal vital signs. However you have inability to get any blood drawn. We recommend he follow up with her primary care physician in order to fix this. He may need a VIR placed PICC line. Or you may need to talk to surgery about getting a Port-A-Cath placement give any phone numbers for both them to call. However recommend he go through her primary care physician to better coordinate care.

## 2016-06-21 NOTE — ED Triage Notes (Addendum)
Per EMS, pt is coming from home after pt family called EMS stating pt is not "acting like himself". Pt was discharged from [facility] last night. EMS states that pt appears altered compared to pt baseline. Pt has a chronic foley. Family member reports that family has not had any new medications or recent doses of pain medication. Pt family member stated "he has not acted this way since he had an allergic reaction to a medication several years ago".

## 2016-06-21 NOTE — ED Notes (Signed)
Lab notified regarding need for lab draws. Pt is a difficult stick and provider notified.

## 2016-06-21 NOTE — ED Notes (Signed)
IV team paged.  Patient needs PICC per order.

## 2016-06-21 NOTE — ED Notes (Signed)
Bed: WA08 Expected date:  Expected time:  Means of arrival:  Comments: EMS 

## 2016-06-21 NOTE — ED Notes (Signed)
Spoke with IV team advised me to contact [phone] at [facility].  Attempted to call this number, no answer This RN will continue to try.

## 2016-06-21 NOTE — ED Notes (Signed)
[facility] called for transport.  
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
## 2016-06-22 LAB — MRSA CULTURE: CULTURE: NOT DETECTED

## 2016-06-22 NOTE — ED Notes (Signed)
Pt has 3rd stage large wound across mid to lower back and hips as well as 2nd stage wound to upper R back.  Wound is packed with gauze, chux underneath changed as well as bedding.  Pt propped up with pillows to take pressure off of wound.  Denies any pain.  Foley and colostomy bags in place.  Will turn pt frequently and assess need for re-packing of wounds with new gauze hourly.

## 2016-06-22 NOTE — ED Notes (Signed)
Received report from [place], Therapist, sports.  Pt's sister called, updated her on plan to hold pt until she is home to receive him from [facility] around 8 am.  Will continue to monitor pt & his VS.

## 2016-06-23 DIAGNOSIS — Z933 Colostomy status: Secondary | ICD-10-CM | POA: Diagnosis not present

## 2016-06-23 DIAGNOSIS — S31809A Unspecified open wound of unspecified buttock, initial encounter: Secondary | ICD-10-CM | POA: Diagnosis not present

## 2016-06-23 DIAGNOSIS — S31102A Unspecified open wound of abdominal wall, epigastric region without penetration into peritoneal cavity, initial encounter: Secondary | ICD-10-CM | POA: Diagnosis not present

## 2016-06-23 DIAGNOSIS — L8944 Pressure ulcer of contiguous site of back, buttock and hip, stage 4: Secondary | ICD-10-CM | POA: Diagnosis not present

## 2016-06-24 LAB — URINE CULTURE
Culture: 90000 — AB
SPECIAL REQUESTS: NORMAL

## 2016-06-27 DIAGNOSIS — L89154 Pressure ulcer of sacral region, stage 4: Secondary | ICD-10-CM | POA: Diagnosis not present

## 2016-06-27 DIAGNOSIS — Z7409 Other reduced mobility: Secondary | ICD-10-CM | POA: Diagnosis not present

## 2016-06-28 ENCOUNTER — Telehealth: Payer: Self-pay | Admitting: *Deleted

## 2016-06-28 ENCOUNTER — Inpatient Hospital Stay: Payer: Medicare Other | Admitting: Infectious Diseases

## 2016-06-28 DIAGNOSIS — L89154 Pressure ulcer of sacral region, stage 4: Secondary | ICD-10-CM | POA: Diagnosis not present

## 2016-06-28 DIAGNOSIS — T83511A Infection and inflammatory reaction due to indwelling urethral catheter, initial encounter: Secondary | ICD-10-CM | POA: Diagnosis not present

## 2016-06-28 DIAGNOSIS — A4102 Sepsis due to Methicillin resistant Staphylococcus aureus: Secondary | ICD-10-CM | POA: Diagnosis not present

## 2016-06-28 DIAGNOSIS — L89150 Pressure ulcer of sacral region, unstageable: Secondary | ICD-10-CM | POA: Diagnosis not present

## 2016-06-28 DIAGNOSIS — B9620 Unspecified Escherichia coli [E. coli] as the cause of diseases classified elsewhere: Secondary | ICD-10-CM | POA: Diagnosis not present

## 2016-06-28 DIAGNOSIS — M8638 Chronic multifocal osteomyelitis, other site: Secondary | ICD-10-CM | POA: Diagnosis not present

## 2016-06-28 DIAGNOSIS — L89890 Pressure ulcer of other site, unstageable: Secondary | ICD-10-CM | POA: Diagnosis not present

## 2016-06-28 NOTE — Telephone Encounter (Signed)
Glad to see him thanks

## 2016-06-28 NOTE — Telephone Encounter (Signed)
Patient called wanting to know what the next plan was for him; he is currently off vancomycin after getting out of the hospital. He had a follow up today, but had to cancel because his transportation (sister is sick). He was rescheduled for 07/05/16. Advised if he does not need to keep this appointment we will let him know.

## 2016-06-30 DIAGNOSIS — M8669 Other chronic osteomyelitis, multiple sites: Secondary | ICD-10-CM | POA: Diagnosis not present

## 2016-06-30 DIAGNOSIS — N179 Acute kidney failure, unspecified: Secondary | ICD-10-CM | POA: Diagnosis not present

## 2016-06-30 DIAGNOSIS — G8250 Quadriplegia, unspecified: Secondary | ICD-10-CM | POA: Diagnosis not present

## 2016-06-30 DIAGNOSIS — G4730 Sleep apnea, unspecified: Secondary | ICD-10-CM | POA: Diagnosis not present

## 2016-07-01 DIAGNOSIS — L89154 Pressure ulcer of sacral region, stage 4: Secondary | ICD-10-CM | POA: Diagnosis not present

## 2016-07-05 ENCOUNTER — Ambulatory Visit (INDEPENDENT_AMBULATORY_CARE_PROVIDER_SITE_OTHER): Payer: Medicare Other | Admitting: Infectious Diseases

## 2016-07-05 ENCOUNTER — Ambulatory Visit: Payer: Medicare Other | Admitting: Infectious Diseases

## 2016-07-05 ENCOUNTER — Other Ambulatory Visit (HOSPITAL_COMMUNITY): Payer: Self-pay | Admitting: Nurse Practitioner

## 2016-07-05 ENCOUNTER — Encounter: Payer: Self-pay | Admitting: Infectious Diseases

## 2016-07-05 VITALS — Temp 99.0°F | Ht 72.0 in

## 2016-07-05 DIAGNOSIS — N179 Acute kidney failure, unspecified: Secondary | ICD-10-CM

## 2016-07-05 DIAGNOSIS — L89154 Pressure ulcer of sacral region, stage 4: Secondary | ICD-10-CM

## 2016-07-05 NOTE — Assessment & Plan Note (Signed)
Will repeat his BMP today.

## 2016-07-05 NOTE — Assessment & Plan Note (Signed)
Will check his ESR and CRP today.  Will get him in with Dr Marla Roe Will get him in with [facility] for port.  Will see him back in 6 weeks.
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
## 2016-07-06 ENCOUNTER — Other Ambulatory Visit (HOSPITAL_COMMUNITY): Payer: Self-pay | Admitting: Nurse Practitioner

## 2016-07-06 DIAGNOSIS — M8669 Other chronic osteomyelitis, multiple sites: Secondary | ICD-10-CM

## 2016-07-06 DIAGNOSIS — G8250 Quadriplegia, unspecified: Secondary | ICD-10-CM

## 2016-07-06 LAB — SEDIMENTATION RATE: SED RATE: 69 mm/h — AB (ref 0–15)

## 2016-07-06 LAB — C-REACTIVE PROTEIN: CRP: 123.5 mg/L — AB (ref <8.0)

## 2016-07-07 DIAGNOSIS — S31809A Unspecified open wound of unspecified buttock, initial encounter: Secondary | ICD-10-CM | POA: Diagnosis not present

## 2016-07-07 DIAGNOSIS — S31102A Unspecified open wound of abdominal wall, epigastric region without penetration into peritoneal cavity, initial encounter: Secondary | ICD-10-CM | POA: Diagnosis not present

## 2016-07-07 DIAGNOSIS — L8944 Pressure ulcer of contiguous site of back, buttock and hip, stage 4: Secondary | ICD-10-CM | POA: Diagnosis not present

## 2016-07-07 DIAGNOSIS — Z933 Colostomy status: Secondary | ICD-10-CM | POA: Diagnosis not present

## 2016-07-08 ENCOUNTER — Encounter (HOSPITAL_BASED_OUTPATIENT_CLINIC_OR_DEPARTMENT_OTHER): Payer: No Typology Code available for payment source | Attending: Internal Medicine

## 2016-07-11 IMAGING — US US RENAL
1 series · 14 of 23 positions shown · non-contrast
Comparison: CT abdomen pelvis dated 11/19/2015

CLINICAL DATA: Kidney stones

EXAM:
RENAL / URINARY TRACT ULTRASOUND COMPLETE

[Series 1: us renal · 0.34mm/px · 14 of 23 slices shown]
[im 1/23]
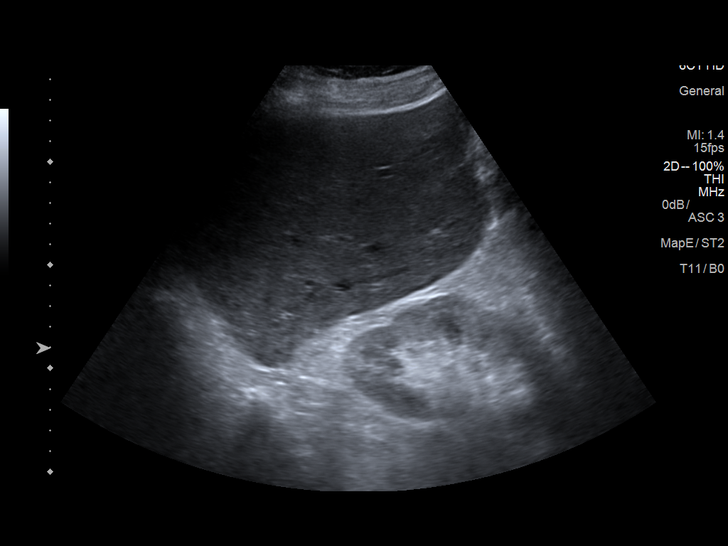
[im 3/23]
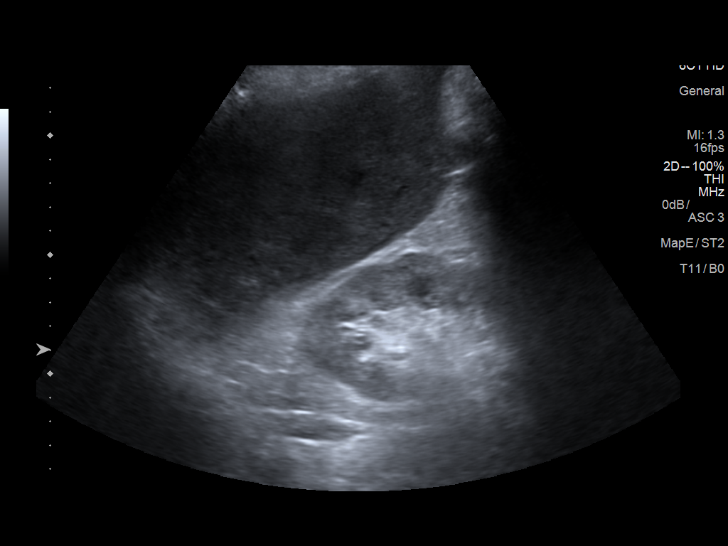
[im 5/23]
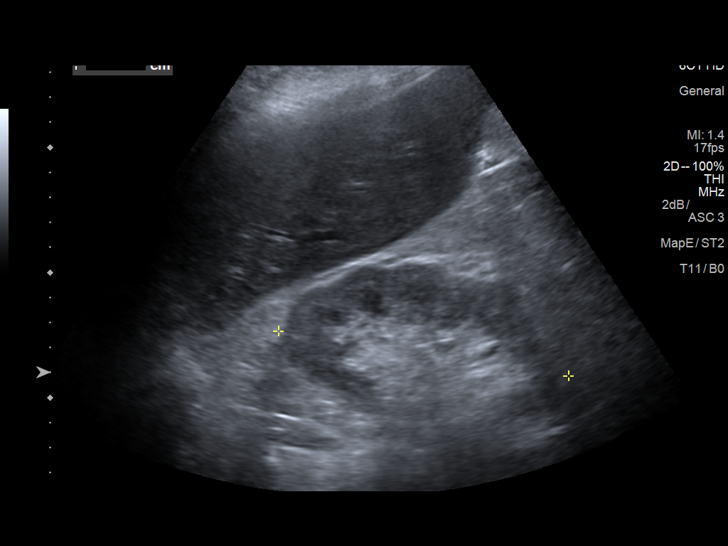
[im 6/23]
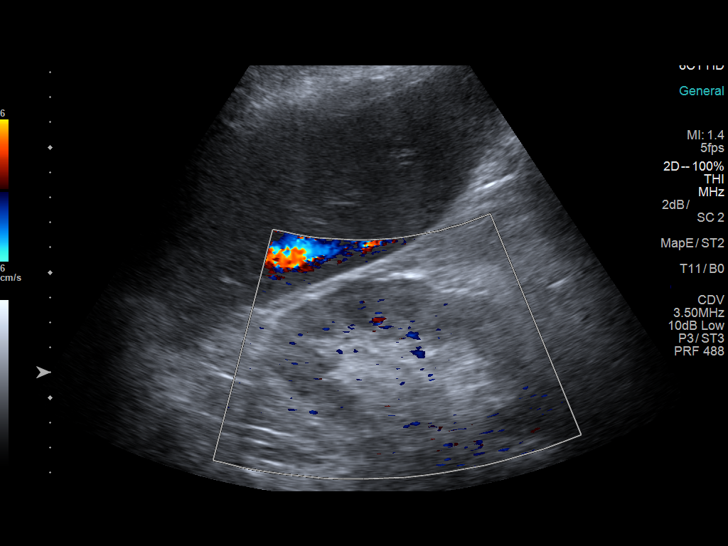
[im 8/23]
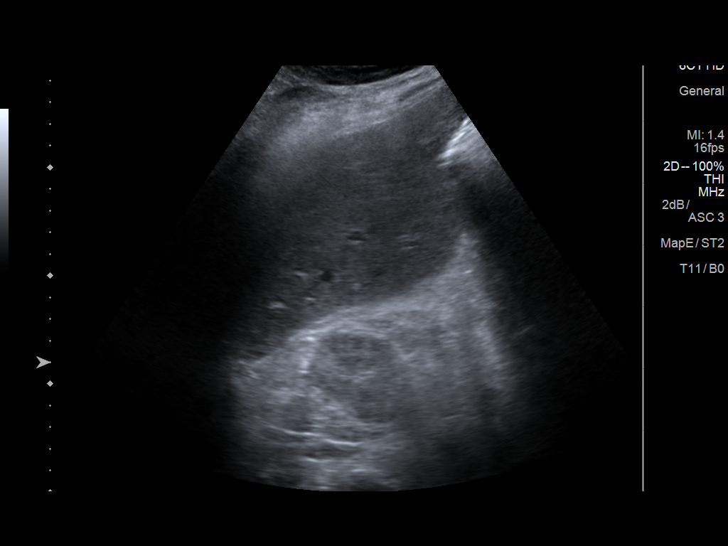
[im 10/23]
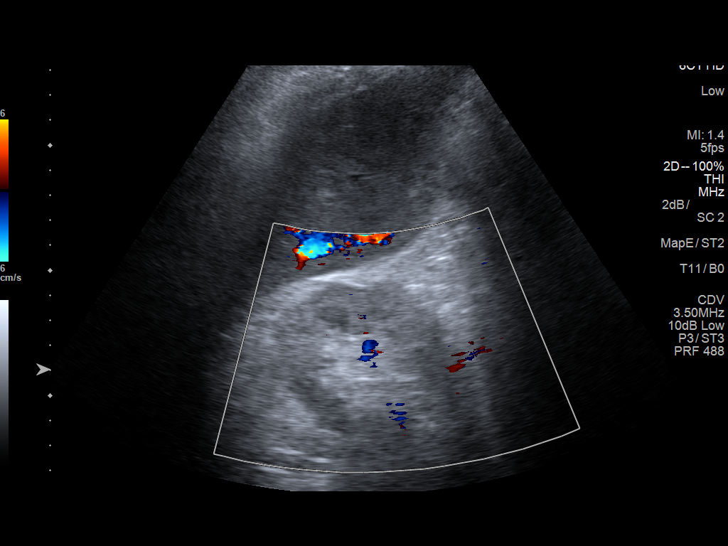
[im 11/23]
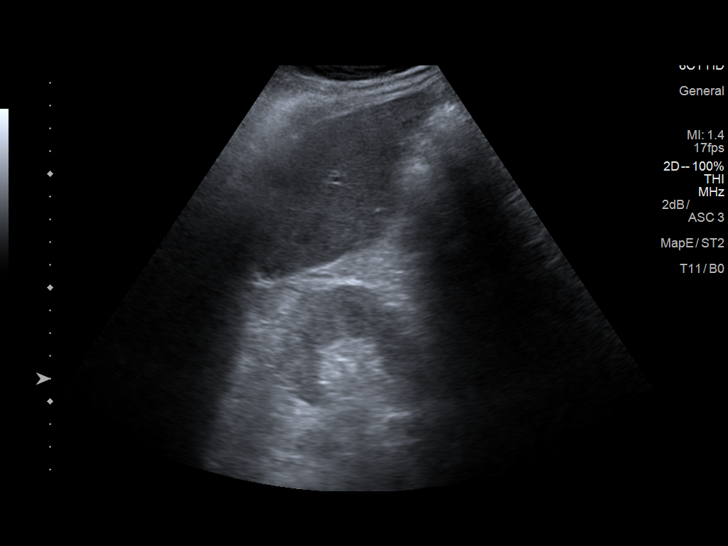
[im 13/23]
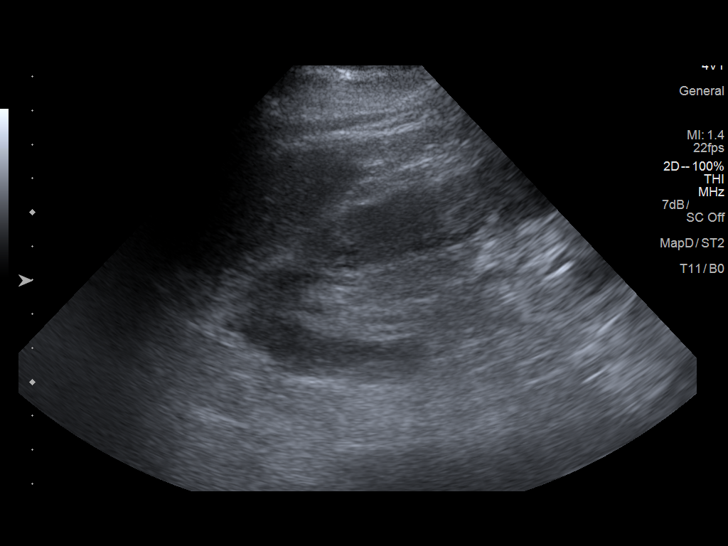
[im 14/23]
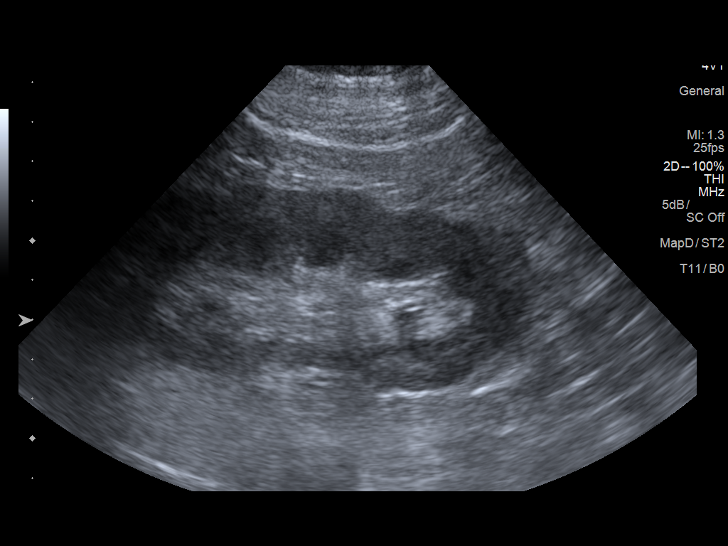
[im 16/23]
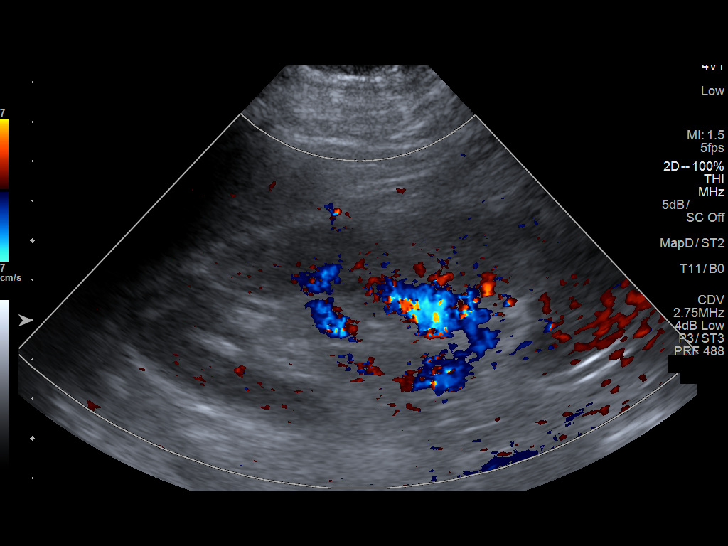
[im 18/23]
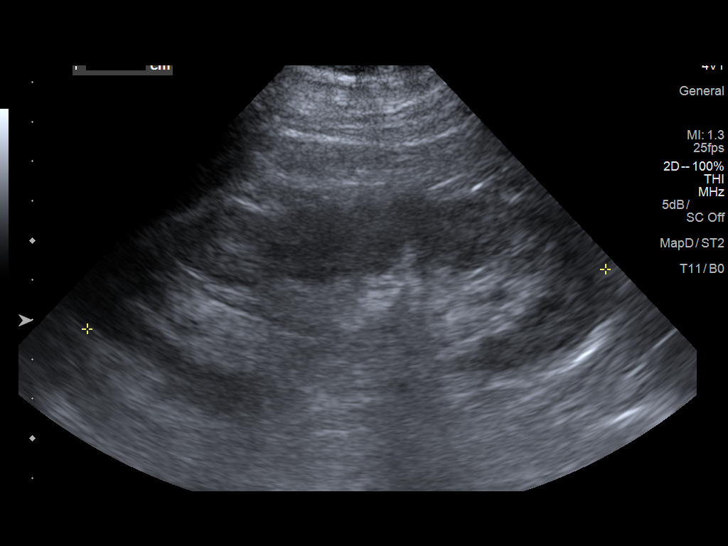
[im 19/23]
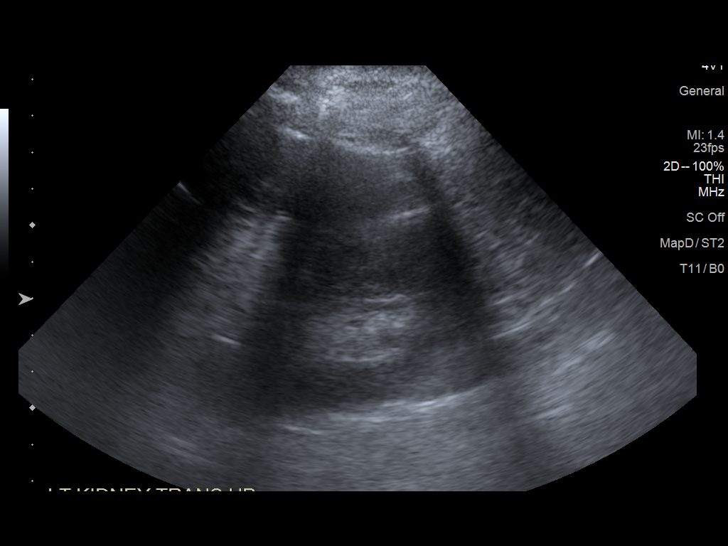
[im 21/23]
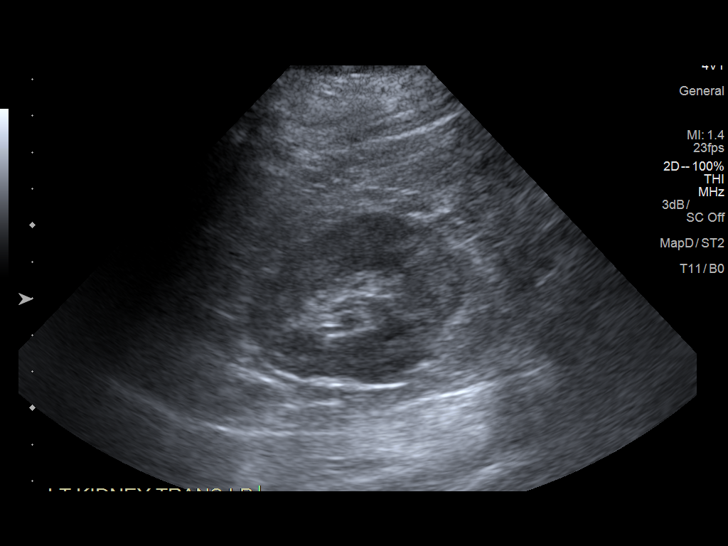
[im 23/23]
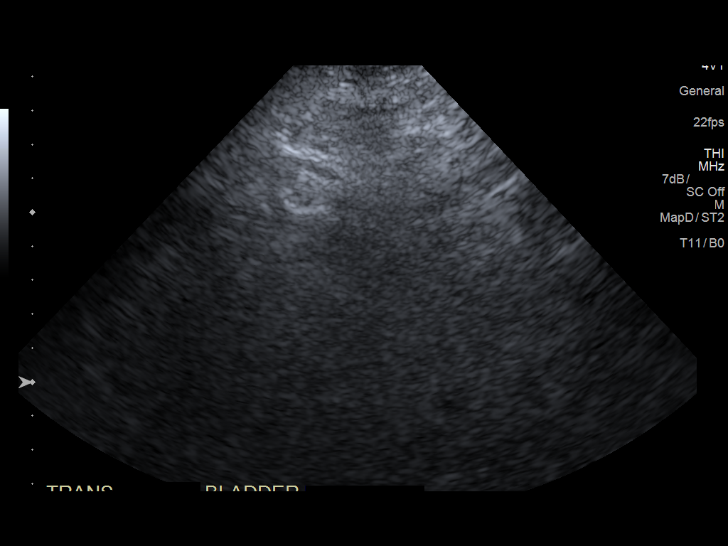

[14 of 23 positions shown; findings below may reference images not displayed]

FINDINGS: Right Kidney:

Length: 11.7 cm.  No mass or hydronephrosis.

Left Kidney:

Length: 13.2 cm.  No mass or hydronephrosis.

Bladder:

Within normal limits.
IMPRESSION: Negative renal ultrasound.

## 2016-07-12 ENCOUNTER — Telehealth: Payer: Self-pay | Admitting: Hematology

## 2016-07-12 ENCOUNTER — Other Ambulatory Visit: Payer: Self-pay | Admitting: Student

## 2016-07-12 NOTE — Telephone Encounter (Signed)
Pt called to r/s appt to 3/16

## 2016-07-13 ENCOUNTER — Ambulatory Visit (HOSPITAL_COMMUNITY)
Admission: RE | Admit: 2016-07-13 | Discharge: 2016-07-13 | Disposition: A | Discharge status: Home / Self Care | Payer: Medicare Other | Source: Ambulatory Visit | Attending: Nurse Practitioner | Admitting: Nurse Practitioner

## 2016-07-13 ENCOUNTER — Encounter (HOSPITAL_COMMUNITY): Payer: Self-pay

## 2016-07-13 ENCOUNTER — Other Ambulatory Visit (HOSPITAL_COMMUNITY): Payer: Self-pay | Admitting: Nurse Practitioner

## 2016-07-13 ENCOUNTER — Ambulatory Visit: Payer: Medicare Other | Admitting: Hematology

## 2016-07-13 DIAGNOSIS — K219 Gastro-esophageal reflux disease without esophagitis: Secondary | ICD-10-CM | POA: Insufficient documentation

## 2016-07-13 DIAGNOSIS — Z452 Encounter for adjustment and management of vascular access device: Secondary | ICD-10-CM | POA: Diagnosis not present

## 2016-07-13 DIAGNOSIS — F329 Major depressive disorder, single episode, unspecified: Secondary | ICD-10-CM | POA: Diagnosis not present

## 2016-07-13 DIAGNOSIS — J9610 Chronic respiratory failure, unspecified whether with hypoxia or hypercapnia: Secondary | ICD-10-CM | POA: Insufficient documentation

## 2016-07-13 DIAGNOSIS — Z8719 Personal history of other diseases of the digestive system: Secondary | ICD-10-CM | POA: Insufficient documentation

## 2016-07-13 DIAGNOSIS — M8668 Other chronic osteomyelitis, other site: Secondary | ICD-10-CM | POA: Diagnosis not present

## 2016-07-13 DIAGNOSIS — I10 Essential (primary) hypertension: Secondary | ICD-10-CM | POA: Insufficient documentation

## 2016-07-13 DIAGNOSIS — G8250 Quadriplegia, unspecified: Secondary | ICD-10-CM

## 2016-07-13 DIAGNOSIS — M8669 Other chronic osteomyelitis, multiple sites: Secondary | ICD-10-CM

## 2016-07-13 DIAGNOSIS — E662 Morbid (severe) obesity with alveolar hypoventilation: Secondary | ICD-10-CM | POA: Diagnosis not present

## 2016-07-13 DIAGNOSIS — I872 Venous insufficiency (chronic) (peripheral): Secondary | ICD-10-CM | POA: Diagnosis not present

## 2016-07-13 HISTORY — PX: IR GENERIC HISTORICAL: IMG1180011

## 2016-07-13 LAB — CBC
HCT: 26.4 % — ABNORMAL LOW (ref 39.0–52.0)
HEMOGLOBIN: 8.2 g/dL — AB (ref 13.0–17.0)
MCH: 24.6 pg — ABNORMAL LOW (ref 26.0–34.0)
MCHC: 31.1 g/dL (ref 30.0–36.0)
MCV: 79.3 fL (ref 78.0–100.0)
PLATELETS: 460 10*3/uL — AB (ref 150–400)
RBC: 3.33 MIL/uL — AB (ref 4.22–5.81)
RDW: 16.8 % — ABNORMAL HIGH (ref 11.5–15.5)
WBC: 9.9 10*3/uL (ref 4.0–10.5)

## 2016-07-13 LAB — APTT

## 2016-07-13 LAB — PROTIME-INR

## 2016-07-13 MED ORDER — LIDOCAINE-EPINEPHRINE (PF) 2 %-1:200000 IJ SOLN
INTRAMUSCULAR | Status: AC
  Filled 2016-07-13: qty 20

## 2016-07-13 MED ORDER — CEFAZOLIN SODIUM-DEXTROSE 2-4 GM/100ML-% IV SOLN
INTRAVENOUS | Status: AC
  Filled 2016-07-13: qty 100

## 2016-07-13 MED ORDER — MIDAZOLAM HCL 2 MG/2ML IJ SOLN
INTRAMUSCULAR | Status: AC
  Filled 2016-07-13: qty 4

## 2016-07-13 MED ORDER — SODIUM CHLORIDE 0.9 % IV SOLN
INTRAVENOUS | Status: DC
  Administered 2016-07-13: 15:00:00 via INTRAVENOUS

## 2016-07-13 MED ORDER — FENTANYL CITRATE (PF) 100 MCG/2ML IJ SOLN
INTRAMUSCULAR | Status: AC | PRN
  Administered 2016-07-13 (×2): 50 ug via INTRAVENOUS

## 2016-07-13 MED ORDER — CEFAZOLIN SODIUM-DEXTROSE 2-4 GM/100ML-% IV SOLN
2.0000 g | Freq: Once | INTRAVENOUS | Status: AC
  Administered 2016-07-13: 2 g via INTRAVENOUS

## 2016-07-13 MED ORDER — MIDAZOLAM HCL 2 MG/2ML IJ SOLN
INTRAMUSCULAR | Status: AC | PRN
  Administered 2016-07-13 (×3): 1 mg via INTRAVENOUS

## 2016-07-13 MED ORDER — LIDOCAINE HCL 1 % IJ SOLN
INTRAMUSCULAR | Status: DC | PRN
  Administered 2016-07-13: 10 mL

## 2016-07-13 MED ORDER — HEPARIN SOD (PORK) LOCK FLUSH 100 UNIT/ML IV SOLN
INTRAVENOUS | Status: AC
  Filled 2016-07-13: qty 5

## 2016-07-13 MED ORDER — FLUMAZENIL 0.5 MG/5ML IV SOLN
INTRAVENOUS | Status: AC
  Filled 2016-07-13: qty 5

## 2016-07-13 MED ORDER — LIDOCAINE HCL 1 % IJ SOLN
INTRAMUSCULAR | Status: AC
  Filled 2016-07-13: qty 20

## 2016-07-13 MED ORDER — LIDOCAINE-EPINEPHRINE (PF) 2 %-1:200000 IJ SOLN
INTRAMUSCULAR | Status: DC | PRN
  Administered 2016-07-13 (×3): 20 mL

## 2016-07-13 MED ORDER — FENTANYL CITRATE (PF) 100 MCG/2ML IJ SOLN
INTRAMUSCULAR | Status: AC
  Filled 2016-07-13: qty 4

## 2016-07-13 MED ORDER — NALOXONE HCL 0.4 MG/ML IJ SOLN
INTRAMUSCULAR | Status: AC
  Filled 2016-07-13: qty 1

## 2016-07-13 NOTE — Discharge Instructions (Signed)
Implanted Port Insertion, Care After °This sheet gives you information about how to care for yourself after your procedure. Your health care provider may also give you more specific instructions. If you have problems or questions, contact your health care provider. °What can I expect after the procedure? °After your procedure, it is common to have: °· Discomfort at the port insertion site. °· Bruising on the skin over the port. This should improve over 3-4 days. °Follow these instructions at home: °Port care  °· After your port is placed, you will get a manufacturer's information card. The card has information about your port. Keep this card with you at all times. °· Take care of the port as told by your health care provider. Ask your health care provider if you or a family member can get training for taking care of the port at home. A home health care nurse may also take care of the port. °· Make sure to remember what type of port you have. °Incision care  °· Follow instructions from your health care provider about how to take care of your port insertion site. Make sure you: °¨ Wash your hands with soap and water before you change your bandage (dressing). If soap and water are not available, use hand sanitizer. °¨ Change your dressing as told by your health care provider. °¨ Leave stitches (sutures), skin glue, or adhesive strips in place. These skin closures may need to stay in place for 2 weeks or longer. If adhesive strip edges start to loosen and curl up, you may trim the loose edges. Do not remove adhesive strips completely unless your health care provider tells you to do that. °· Check your port insertion site every day for signs of infection. Check for: °¨ More redness, swelling, or pain. °¨ More fluid or blood. °¨ Warmth. °¨ Pus or a bad smell. °General instructions  °· Do not take baths, swim, or use a hot tub until your health care provider approves. °· Do not lift anything that is heavier than 10 lb (4.5  kg) for a week, or as told by your health care provider. °· Ask your health care provider when it is okay to: °¨ Return to work or school. °¨ Resume usual physical activities or sports. °· Do not drive for 24 hours if you were given a medicine to help you relax (sedative). °· Take over-the-counter and prescription medicines only as told by your health care provider. °· Wear a medical alert bracelet in case of an emergency. This will tell any health care providers that you have a port. °· Keep all follow-up visits as told by your health care provider. This is important. °Contact a health care provider if: °· You cannot flush your port with saline as directed, or you cannot draw blood from the port. °· You have a fever or chills. °· You have more redness, swelling, or pain around your port insertion site. °· You have more fluid or blood coming from your port insertion site. °· Your port insertion site feels warm to the touch. °· You have pus or a bad smell coming from the port insertion site. °Get help right away if: °· You have chest pain or shortness of breath. °· You have bleeding from your port that you cannot control. °Summary °· Take care of the port as told by your health care provider. °· Change your dressing as told by your health care provider. °· Keep all follow-up visits as told by your health   care provider. °This information is not intended to replace advice given to you by your health care provider. Make sure you discuss any questions you have with your health care provider. °Document Released: 02/20/2013 Document Revised: 03/23/2016 Document Reviewed: 03/23/2016 °Elsevier Interactive Patient Education © 2017 [employer] ° ° °Moderate Conscious Sedation, Adult, Care After °These instructions provide you with information about caring for yourself after your procedure. Your health care provider may also give you more specific instructions. Your treatment has been planned according to current medical  practices, but problems sometimes occur. Call your health care provider if you have any problems or questions after your procedure. °What can I expect after the procedure? °After your procedure, it is common: °· To feel sleepy for several hours. °· To feel clumsy and have poor balance for several hours. °· To have poor judgment for several hours. °· To vomit if you eat too soon. °Follow these instructions at home: °For at least 24 hours after the procedure:  ° °· Do not: °¨ Participate in activities where you could fall or become injured. °¨ Drive. °¨ Use heavy machinery. °¨ Drink alcohol. °¨ Take sleeping pills or medicines that cause drowsiness. °¨ Make important decisions or sign legal documents. °¨ Take care of children on your own. °· Rest. °Eating and drinking  °· Follow the diet recommended by your health care provider. °· If you vomit: °¨ Drink water, juice, or soup when you can drink without vomiting. °¨ Make sure you have little or no nausea before eating solid foods. °General instructions  °· Have a responsible adult stay with you until you are awake and alert. °· Take over-the-counter and prescription medicines only as told by your health care provider. °· If you smoke, do not smoke without supervision. °· Keep all follow-up visits as told by your health care provider. This is important. °Contact a health care provider if: °· You keep feeling nauseous or you keep vomiting. °· You feel light-headed. °· You develop a rash. °· You have a fever. °Get help right away if: °· You have trouble breathing. °This information is not intended to replace advice given to you by your health care provider. Make sure you discuss any questions you have with your health care provider. °Document Released: 02/20/2013 Document Revised: 10/05/2015 Document Reviewed: 08/22/2015 °Elsevier Interactive Patient Education © 2017 [employer] Inc[employer] ° ° °

## 2016-07-13 NOTE — Procedures (Signed)
R IJ Port cathter placement with US and fluoroscopy No complication No blood loss. See complete dictation in [facility] PACS.  
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
## 2016-07-14 ENCOUNTER — Encounter (HOSPITAL_BASED_OUTPATIENT_CLINIC_OR_DEPARTMENT_OTHER): Payer: Medicare Other | Attending: Internal Medicine

## 2016-07-14 DIAGNOSIS — G8250 Quadriplegia, unspecified: Secondary | ICD-10-CM | POA: Diagnosis not present

## 2016-07-14 DIAGNOSIS — I10 Essential (primary) hypertension: Secondary | ICD-10-CM | POA: Diagnosis not present

## 2016-07-14 DIAGNOSIS — L89323 Pressure ulcer of left buttock, stage 3: Secondary | ICD-10-CM | POA: Diagnosis not present

## 2016-07-14 DIAGNOSIS — L89154 Pressure ulcer of sacral region, stage 4: Secondary | ICD-10-CM | POA: Insufficient documentation

## 2016-07-14 DIAGNOSIS — G4730 Sleep apnea, unspecified: Secondary | ICD-10-CM | POA: Insufficient documentation

## 2016-07-14 DIAGNOSIS — L97822 Non-pressure chronic ulcer of other part of left lower leg with fat layer exposed: Secondary | ICD-10-CM | POA: Diagnosis not present

## 2016-07-14 DIAGNOSIS — L89314 Pressure ulcer of right buttock, stage 4: Secondary | ICD-10-CM | POA: Diagnosis not present

## 2016-07-14 DIAGNOSIS — L89893 Pressure ulcer of other site, stage 3: Secondary | ICD-10-CM | POA: Insufficient documentation

## 2016-07-14 DIAGNOSIS — N179 Acute kidney failure, unspecified: Secondary | ICD-10-CM | POA: Diagnosis not present

## 2016-07-14 DIAGNOSIS — M8669 Other chronic osteomyelitis, multiple sites: Secondary | ICD-10-CM | POA: Insufficient documentation

## 2016-07-14 DIAGNOSIS — L89153 Pressure ulcer of sacral region, stage 3: Secondary | ICD-10-CM | POA: Diagnosis not present

## 2016-07-14 DIAGNOSIS — M8668 Other chronic osteomyelitis, other site: Secondary | ICD-10-CM | POA: Diagnosis not present

## 2016-07-25 DIAGNOSIS — L89154 Pressure ulcer of sacral region, stage 4: Secondary | ICD-10-CM | POA: Diagnosis not present

## 2016-07-25 DIAGNOSIS — Z7409 Other reduced mobility: Secondary | ICD-10-CM | POA: Diagnosis not present

## 2016-07-27 DIAGNOSIS — L89894 Pressure ulcer of other site, stage 4: Secondary | ICD-10-CM | POA: Diagnosis not present

## 2016-07-27 DIAGNOSIS — I872 Venous insufficiency (chronic) (peripheral): Secondary | ICD-10-CM | POA: Diagnosis not present

## 2016-07-27 DIAGNOSIS — D649 Anemia, unspecified: Secondary | ICD-10-CM | POA: Diagnosis not present

## 2016-07-27 DIAGNOSIS — Z435 Encounter for attention to cystostomy: Secondary | ICD-10-CM | POA: Diagnosis not present

## 2016-07-27 DIAGNOSIS — L89892 Pressure ulcer of other site, stage 2: Secondary | ICD-10-CM | POA: Diagnosis not present

## 2016-07-27 DIAGNOSIS — S12400S Unspecified displaced fracture of fifth cervical vertebra, sequela: Secondary | ICD-10-CM | POA: Diagnosis not present

## 2016-07-27 DIAGNOSIS — L97811 Non-pressure chronic ulcer of other part of right lower leg limited to breakdown of skin: Secondary | ICD-10-CM | POA: Diagnosis not present

## 2016-07-27 DIAGNOSIS — L89150 Pressure ulcer of sacral region, unstageable: Secondary | ICD-10-CM | POA: Diagnosis not present

## 2016-07-27 DIAGNOSIS — G8253 Quadriplegia, C5-C7 complete: Secondary | ICD-10-CM | POA: Diagnosis not present

## 2016-07-27 DIAGNOSIS — L89313 Pressure ulcer of right buttock, stage 3: Secondary | ICD-10-CM | POA: Diagnosis not present

## 2016-07-29 ENCOUNTER — Ambulatory Visit: Payer: Medicare Other | Admitting: Hematology

## 2016-07-29 ENCOUNTER — Telehealth: Payer: Self-pay | Admitting: Hematology

## 2016-07-29 DIAGNOSIS — L89103 Pressure ulcer of unspecified part of back, stage 3: Secondary | ICD-10-CM | POA: Diagnosis not present

## 2016-07-29 DIAGNOSIS — L89313 Pressure ulcer of right buttock, stage 3: Secondary | ICD-10-CM | POA: Diagnosis not present

## 2016-07-29 DIAGNOSIS — L89154 Pressure ulcer of sacral region, stage 4: Secondary | ICD-10-CM | POA: Diagnosis not present

## 2016-07-29 NOTE — Telephone Encounter (Signed)
Patient called and moved f/u from 3/16 to 3/28. Patient has new date/time.

## 2016-07-31 ENCOUNTER — Encounter (HOSPITAL_COMMUNITY): Payer: Self-pay | Admitting: Emergency Medicine

## 2016-07-31 ENCOUNTER — Inpatient Hospital Stay (HOSPITAL_COMMUNITY)
Admission: EM | Admit: 2016-07-31 | Discharge: 2016-08-07 | DRG: 463 | Disposition: A | Discharge status: Home Health | Payer: Medicare Other | Attending: Family Medicine | Admitting: Family Medicine

## 2016-07-31 DIAGNOSIS — S14105S Unspecified injury at C5 level of cervical spinal cord, sequela: Secondary | ICD-10-CM

## 2016-07-31 DIAGNOSIS — Z803 Family history of malignant neoplasm of breast: Secondary | ICD-10-CM | POA: Diagnosis not present

## 2016-07-31 DIAGNOSIS — Z833 Family history of diabetes mellitus: Secondary | ICD-10-CM | POA: Diagnosis not present

## 2016-07-31 DIAGNOSIS — Z888 Allergy status to other drugs, medicaments and biological substances status: Secondary | ICD-10-CM | POA: Diagnosis not present

## 2016-07-31 DIAGNOSIS — D638 Anemia in other chronic diseases classified elsewhere: Secondary | ICD-10-CM | POA: Diagnosis present

## 2016-07-31 DIAGNOSIS — T814XXA Infection following a procedure, initial encounter: Secondary | ICD-10-CM | POA: Diagnosis not present

## 2016-07-31 DIAGNOSIS — Z9989 Dependence on other enabling machines and devices: Secondary | ICD-10-CM

## 2016-07-31 DIAGNOSIS — L89324 Pressure ulcer of left buttock, stage 4: Secondary | ICD-10-CM | POA: Diagnosis present

## 2016-07-31 DIAGNOSIS — Z933 Colostomy status: Secondary | ICD-10-CM | POA: Diagnosis not present

## 2016-07-31 DIAGNOSIS — Z881 Allergy status to other antibiotic agents status: Secondary | ICD-10-CM | POA: Diagnosis not present

## 2016-07-31 DIAGNOSIS — T148XXA Other injury of unspecified body region, initial encounter: Secondary | ICD-10-CM | POA: Diagnosis not present

## 2016-07-31 DIAGNOSIS — L089 Local infection of the skin and subcutaneous tissue, unspecified: Secondary | ICD-10-CM

## 2016-07-31 DIAGNOSIS — Z9359 Other cystostomy status: Secondary | ICD-10-CM

## 2016-07-31 DIAGNOSIS — R7881 Bacteremia: Secondary | ICD-10-CM | POA: Diagnosis not present

## 2016-07-31 DIAGNOSIS — K219 Gastro-esophageal reflux disease without esophagitis: Secondary | ICD-10-CM | POA: Diagnosis present

## 2016-07-31 DIAGNOSIS — B964 Proteus (mirabilis) (morganii) as the cause of diseases classified elsewhere: Secondary | ICD-10-CM | POA: Diagnosis present

## 2016-07-31 DIAGNOSIS — M4624 Osteomyelitis of vertebra, thoracic region: Secondary | ICD-10-CM | POA: Diagnosis present

## 2016-07-31 DIAGNOSIS — G8250 Quadriplegia, unspecified: Secondary | ICD-10-CM | POA: Diagnosis not present

## 2016-07-31 DIAGNOSIS — L89119 Pressure ulcer of right upper back, unspecified stage: Secondary | ICD-10-CM | POA: Diagnosis not present

## 2016-07-31 DIAGNOSIS — S21201A Unspecified open wound of right back wall of thorax without penetration into thoracic cavity, initial encounter: Secondary | ICD-10-CM

## 2016-07-31 DIAGNOSIS — M24541 Contracture, right hand: Secondary | ICD-10-CM | POA: Diagnosis present

## 2016-07-31 DIAGNOSIS — M4628 Osteomyelitis of vertebra, sacral and sacrococcygeal region: Secondary | ICD-10-CM | POA: Diagnosis not present

## 2016-07-31 DIAGNOSIS — R509 Fever, unspecified: Secondary | ICD-10-CM | POA: Diagnosis not present

## 2016-07-31 DIAGNOSIS — Z981 Arthrodesis status: Secondary | ICD-10-CM

## 2016-07-31 DIAGNOSIS — Z936 Other artificial openings of urinary tract status: Secondary | ICD-10-CM

## 2016-07-31 DIAGNOSIS — L89154 Pressure ulcer of sacral region, stage 4: Secondary | ICD-10-CM | POA: Diagnosis not present

## 2016-07-31 DIAGNOSIS — J9610 Chronic respiratory failure, unspecified whether with hypoxia or hypercapnia: Secondary | ICD-10-CM | POA: Diagnosis present

## 2016-07-31 DIAGNOSIS — I10 Essential (primary) hypertension: Secondary | ICD-10-CM | POA: Diagnosis present

## 2016-07-31 DIAGNOSIS — L89314 Pressure ulcer of right buttock, stage 4: Secondary | ICD-10-CM | POA: Diagnosis present

## 2016-07-31 DIAGNOSIS — Z95828 Presence of other vascular implants and grafts: Secondary | ICD-10-CM | POA: Diagnosis not present

## 2016-07-31 DIAGNOSIS — Z79899 Other long term (current) drug therapy: Secondary | ICD-10-CM

## 2016-07-31 DIAGNOSIS — I739 Peripheral vascular disease, unspecified: Secondary | ICD-10-CM | POA: Diagnosis present

## 2016-07-31 DIAGNOSIS — L89114 Pressure ulcer of right upper back, stage 4: Secondary | ICD-10-CM

## 2016-07-31 DIAGNOSIS — L89893 Pressure ulcer of other site, stage 3: Secondary | ICD-10-CM | POA: Diagnosis present

## 2016-07-31 DIAGNOSIS — L0889 Other specified local infections of the skin and subcutaneous tissue: Secondary | ICD-10-CM | POA: Diagnosis not present

## 2016-07-31 DIAGNOSIS — M24542 Contracture, left hand: Secondary | ICD-10-CM | POA: Diagnosis present

## 2016-07-31 DIAGNOSIS — G4733 Obstructive sleep apnea (adult) (pediatric): Secondary | ICD-10-CM | POA: Diagnosis present

## 2016-07-31 LAB — CBC WITH DIFFERENTIAL/PLATELET
BASOS ABS: 0 10*3/uL (ref 0.0–0.1)
BASOS PCT: 0 %
Eosinophils Absolute: 0.4 10*3/uL (ref 0.0–0.7)
Eosinophils Relative: 4 %
HCT: 25.4 % — ABNORMAL LOW (ref 39.0–52.0)
Hemoglobin: 7.7 g/dL — ABNORMAL LOW (ref 13.0–17.0)
Lymphocytes Relative: 15 %
Lymphs Abs: 1.2 10*3/uL (ref 0.7–4.0)
MCH: 24.4 pg — ABNORMAL LOW (ref 26.0–34.0)
MCHC: 30.3 g/dL (ref 30.0–36.0)
MCV: 80.6 fL (ref 78.0–100.0)
MONO ABS: 1.4 10*3/uL — AB (ref 0.1–1.0)
MONOS PCT: 16 %
NEUTROS PCT: 65 %
Neutro Abs: 5.3 10*3/uL (ref 1.7–7.7)
Platelets: 379 10*3/uL (ref 150–400)
RBC: 3.15 MIL/uL — ABNORMAL LOW (ref 4.22–5.81)
RDW: 16.5 % — AB (ref 11.5–15.5)
WBC: 8.2 10*3/uL (ref 4.0–10.5)

## 2016-07-31 LAB — BASIC METABOLIC PANEL
ANION GAP: 9 (ref 5–15)
BUN: 18 mg/dL (ref 6–20)
CALCIUM: 8.4 mg/dL — AB (ref 8.9–10.3)
CO2: 23 mmol/L (ref 22–32)
CREATININE: 0.61 mg/dL (ref 0.61–1.24)
Chloride: 103 mmol/L (ref 101–111)
GFR calc non Af Amer: >60 mL/min (ref >60)
Glucose, Bld: 119 mg/dL — ABNORMAL HIGH (ref 65–99)
Potassium: 3.6 mmol/L (ref 3.5–5.1)
SODIUM: 135 mmol/L (ref 135–145)

## 2016-07-31 LAB — I-STAT CG4 LACTIC ACID, ED: Lactic Acid, Venous: 1.26 mmol/L (ref 0.5–1.9)

## 2016-07-31 MED ORDER — SODIUM CHLORIDE 0.9% FLUSH
10.0000 mL | INTRAVENOUS | Status: DC | PRN
  Administered 2016-08-03 – 2016-08-07 (×2): 10 mL
  Filled 2016-07-31 (×2): qty 40

## 2016-07-31 MED ORDER — CLINDAMYCIN PHOSPHATE 600 MG/50ML IV SOLN
600.0000 mg | Freq: Once | INTRAVENOUS | Status: AC
  Administered 2016-07-31: 600 mg via INTRAVENOUS
  Filled 2016-07-31: qty 50

## 2016-07-31 MED ORDER — SODIUM CHLORIDE 0.9 % IV BOLUS (SEPSIS)
1000.0000 mL | Freq: Once | INTRAVENOUS | Status: AC
  Administered 2016-07-31: 1000 mL via INTRAVENOUS

## 2016-07-31 MED ORDER — PIPERACILLIN-TAZOBACTAM 3.375 G IVPB 30 MIN
3.3750 g | Freq: Once | INTRAVENOUS | Status: AC
  Administered 2016-07-31: 3.375 g via INTRAVENOUS
  Filled 2016-07-31: qty 50
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
## 2016-07-31 NOTE — ED Triage Notes (Signed)
Pt presents from home with complaints of not feeling well since noon with a decreased appetite and increased thirst. Pt has pressure ulcers on his posterior that is currently being treated.
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
## 2016-08-01 DIAGNOSIS — D638 Anemia in other chronic diseases classified elsewhere: Secondary | ICD-10-CM | POA: Diagnosis not present

## 2016-08-01 DIAGNOSIS — Z9359 Other cystostomy status: Secondary | ICD-10-CM | POA: Diagnosis not present

## 2016-08-01 DIAGNOSIS — R7881 Bacteremia: Secondary | ICD-10-CM | POA: Diagnosis not present

## 2016-08-01 DIAGNOSIS — Z881 Allergy status to other antibiotic agents status: Secondary | ICD-10-CM | POA: Diagnosis not present

## 2016-08-01 DIAGNOSIS — Z833 Family history of diabetes mellitus: Secondary | ICD-10-CM | POA: Diagnosis not present

## 2016-08-01 DIAGNOSIS — Z803 Family history of malignant neoplasm of breast: Secondary | ICD-10-CM

## 2016-08-01 DIAGNOSIS — I739 Peripheral vascular disease, unspecified: Secondary | ICD-10-CM | POA: Diagnosis not present

## 2016-08-01 DIAGNOSIS — L89893 Pressure ulcer of other site, stage 3: Secondary | ICD-10-CM | POA: Diagnosis present

## 2016-08-01 DIAGNOSIS — M24541 Contracture, right hand: Secondary | ICD-10-CM | POA: Diagnosis present

## 2016-08-01 DIAGNOSIS — G8250 Quadriplegia, unspecified: Secondary | ICD-10-CM | POA: Diagnosis not present

## 2016-08-01 DIAGNOSIS — L089 Local infection of the skin and subcutaneous tissue, unspecified: Secondary | ICD-10-CM | POA: Diagnosis not present

## 2016-08-01 DIAGNOSIS — M4624 Osteomyelitis of vertebra, thoracic region: Secondary | ICD-10-CM | POA: Diagnosis not present

## 2016-08-01 DIAGNOSIS — L8992 Pressure ulcer of unspecified site, stage 2: Secondary | ICD-10-CM | POA: Diagnosis not present

## 2016-08-01 DIAGNOSIS — L89314 Pressure ulcer of right buttock, stage 4: Secondary | ICD-10-CM | POA: Diagnosis present

## 2016-08-01 DIAGNOSIS — M4628 Osteomyelitis of vertebra, sacral and sacrococcygeal region: Secondary | ICD-10-CM | POA: Diagnosis present

## 2016-08-01 DIAGNOSIS — Z79899 Other long term (current) drug therapy: Secondary | ICD-10-CM | POA: Diagnosis not present

## 2016-08-01 DIAGNOSIS — Z933 Colostomy status: Secondary | ICD-10-CM | POA: Diagnosis not present

## 2016-08-01 DIAGNOSIS — B964 Proteus (mirabilis) (morganii) as the cause of diseases classified elsewhere: Secondary | ICD-10-CM | POA: Diagnosis not present

## 2016-08-01 DIAGNOSIS — T148XXA Other injury of unspecified body region, initial encounter: Secondary | ICD-10-CM | POA: Diagnosis not present

## 2016-08-01 DIAGNOSIS — Z936 Other artificial openings of urinary tract status: Secondary | ICD-10-CM | POA: Diagnosis not present

## 2016-08-01 DIAGNOSIS — I10 Essential (primary) hypertension: Secondary | ICD-10-CM | POA: Diagnosis not present

## 2016-08-01 DIAGNOSIS — K219 Gastro-esophageal reflux disease without esophagitis: Secondary | ICD-10-CM | POA: Diagnosis present

## 2016-08-01 DIAGNOSIS — L89119 Pressure ulcer of right upper back, unspecified stage: Secondary | ICD-10-CM

## 2016-08-01 DIAGNOSIS — B999 Unspecified infectious disease: Secondary | ICD-10-CM | POA: Diagnosis not present

## 2016-08-01 DIAGNOSIS — Z981 Arthrodesis status: Secondary | ICD-10-CM | POA: Diagnosis not present

## 2016-08-01 DIAGNOSIS — L0889 Other specified local infections of the skin and subcutaneous tissue: Secondary | ICD-10-CM | POA: Diagnosis not present

## 2016-08-01 DIAGNOSIS — M24542 Contracture, left hand: Secondary | ICD-10-CM | POA: Diagnosis present

## 2016-08-01 DIAGNOSIS — Z9989 Dependence on other enabling machines and devices: Secondary | ICD-10-CM | POA: Diagnosis not present

## 2016-08-01 DIAGNOSIS — Z95828 Presence of other vascular implants and grafts: Secondary | ICD-10-CM

## 2016-08-01 DIAGNOSIS — S14105S Unspecified injury at C5 level of cervical spinal cord, sequela: Secondary | ICD-10-CM | POA: Diagnosis not present

## 2016-08-01 DIAGNOSIS — Z888 Allergy status to other drugs, medicaments and biological substances status: Secondary | ICD-10-CM | POA: Diagnosis not present

## 2016-08-01 DIAGNOSIS — S21201A Unspecified open wound of right back wall of thorax without penetration into thoracic cavity, initial encounter: Secondary | ICD-10-CM | POA: Diagnosis not present

## 2016-08-01 DIAGNOSIS — G4733 Obstructive sleep apnea (adult) (pediatric): Secondary | ICD-10-CM | POA: Diagnosis not present

## 2016-08-01 DIAGNOSIS — L89324 Pressure ulcer of left buttock, stage 4: Secondary | ICD-10-CM | POA: Diagnosis present

## 2016-08-01 DIAGNOSIS — L89114 Pressure ulcer of right upper back, stage 4: Secondary | ICD-10-CM | POA: Diagnosis not present

## 2016-08-01 DIAGNOSIS — J9610 Chronic respiratory failure, unspecified whether with hypoxia or hypercapnia: Secondary | ICD-10-CM | POA: Diagnosis present

## 2016-08-01 DIAGNOSIS — L89154 Pressure ulcer of sacral region, stage 4: Secondary | ICD-10-CM | POA: Diagnosis not present

## 2016-08-01 DIAGNOSIS — S31000A Unspecified open wound of lower back and pelvis without penetration into retroperitoneum, initial encounter: Secondary | ICD-10-CM | POA: Diagnosis not present

## 2016-08-01 DIAGNOSIS — S21209A Unspecified open wound of unspecified back wall of thorax without penetration into thoracic cavity, initial encounter: Secondary | ICD-10-CM | POA: Diagnosis not present

## 2016-08-01 LAB — BASIC METABOLIC PANEL
Anion gap: 8 (ref 5–15)
BUN: 16 mg/dL (ref 6–20)
CALCIUM: 8.5 mg/dL — AB (ref 8.9–10.3)
CO2: 24 mmol/L (ref 22–32)
CREATININE: 0.59 mg/dL — AB (ref 0.61–1.24)
Chloride: 106 mmol/L (ref 101–111)
GLUCOSE: 107 mg/dL — AB (ref 65–99)
Potassium: 3.8 mmol/L (ref 3.5–5.1)
SODIUM: 138 mmol/L (ref 135–145)

## 2016-08-01 LAB — CBC
HEMATOCRIT: 23.4 % — AB (ref 39.0–52.0)
Hemoglobin: 7 g/dL — ABNORMAL LOW (ref 13.0–17.0)
MCH: 23.9 pg — ABNORMAL LOW (ref 26.0–34.0)
MCHC: 29.9 g/dL — AB (ref 30.0–36.0)
MCV: 79.9 fL (ref 78.0–100.0)
PLATELETS: 335 10*3/uL (ref 150–400)
RBC: 2.93 MIL/uL — ABNORMAL LOW (ref 4.22–5.81)
RDW: 16.1 % — AB (ref 11.5–15.5)
WBC: 8.2 10*3/uL (ref 4.0–10.5)

## 2016-08-01 LAB — SEDIMENTATION RATE: SED RATE: 98 mm/h — AB (ref 0–16)

## 2016-08-01 LAB — HIV ANTIBODY (ROUTINE TESTING W REFLEX): HIV SCREEN 4TH GENERATION: NONREACTIVE

## 2016-08-01 LAB — C-REACTIVE PROTEIN: CRP: 13.1 mg/dL — AB (ref <1.0)

## 2016-08-01 LAB — PREPARE RBC (CROSSMATCH)

## 2016-08-01 MED ORDER — ADULT MULTIVITAMIN W/MINERALS CH
1.0000 | ORAL_TABLET | Freq: Every morning | ORAL | Status: DC
  Administered 2016-08-01 – 2016-08-07 (×6): 1 via ORAL
  Filled 2016-08-01 (×6): qty 1

## 2016-08-01 MED ORDER — IPRATROPIUM BROMIDE 0.02 % IN SOLN: 0.5000 mg | RESPIRATORY_TRACT | Status: DC | PRN

## 2016-08-01 MED ORDER — ENOXAPARIN SODIUM 40 MG/0.4ML ~~LOC~~ SOLN
40.0000 mg | SUBCUTANEOUS | Status: DC
  Administered 2016-08-01 – 2016-08-07 (×6): 40 mg via SUBCUTANEOUS
  Filled 2016-08-01 (×6): qty 0.4

## 2016-08-01 MED ORDER — ZINC SULFATE 220 (50 ZN) MG PO CAPS
220.0000 mg | ORAL_CAPSULE | Freq: Two times a day (BID) | ORAL | Status: DC
  Administered 2016-08-01 – 2016-08-07 (×13): 220 mg via ORAL
  Filled 2016-08-01 (×13): qty 1

## 2016-08-01 MED ORDER — DAKINS (1/4 STRENGTH) 0.125 % EX SOLN
Freq: Every day | CUTANEOUS | Status: AC
  Administered 2016-08-02: 14:00:00
  Filled 2016-08-01 (×3): qty 473

## 2016-08-01 MED ORDER — POTASSIUM CHLORIDE CRYS ER 20 MEQ PO TBCR
20.0000 meq | EXTENDED_RELEASE_TABLET | Freq: Two times a day (BID) | ORAL | Status: DC
  Administered 2016-08-01 – 2016-08-07 (×13): 20 meq via ORAL
  Filled 2016-08-01 (×13): qty 1

## 2016-08-01 MED ORDER — PIPERACILLIN-TAZOBACTAM 3.375 G IVPB 30 MIN: 3.3750 g | Freq: Three times a day (TID) | INTRAVENOUS | Status: DC

## 2016-08-01 MED ORDER — PIPERACILLIN-TAZOBACTAM 3.375 G IVPB
3.3750 g | Freq: Three times a day (TID) | INTRAVENOUS | Status: DC
  Administered 2016-08-01 – 2016-08-02 (×5): 3.375 g via INTRAVENOUS
  Filled 2016-08-01 (×6): qty 50

## 2016-08-01 MED ORDER — ACETAMINOPHEN 325 MG PO TABS: 650.0000 mg | ORAL_TABLET | Freq: Four times a day (QID) | ORAL | Status: DC | PRN

## 2016-08-01 MED ORDER — SUCRALFATE 1 G PO TABS
1.0000 g | ORAL_TABLET | Freq: Three times a day (TID) | ORAL | Status: DC
  Administered 2016-08-01 – 2016-08-07 (×26): 1 g via ORAL
  Filled 2016-08-01 (×25): qty 1

## 2016-08-01 MED ORDER — MAGNESIUM OXIDE 400 (241.3 MG) MG PO TABS
400.0000 mg | ORAL_TABLET | Freq: Every day | ORAL | Status: DC
  Administered 2016-08-01 – 2016-08-07 (×6): 400 mg via ORAL
  Filled 2016-08-01 (×6): qty 1

## 2016-08-01 MED ORDER — VITAMIN C 500 MG PO TABS
500.0000 mg | ORAL_TABLET | Freq: Every morning | ORAL | Status: DC
  Administered 2016-08-01 – 2016-08-07 (×6): 500 mg via ORAL
  Filled 2016-08-01 (×6): qty 1

## 2016-08-01 MED ORDER — METOCLOPRAMIDE HCL 10 MG PO TABS
10.0000 mg | ORAL_TABLET | Freq: Four times a day (QID) | ORAL | Status: DC
  Administered 2016-08-01 – 2016-08-07 (×25): 10 mg via ORAL
  Filled 2016-08-01 (×25): qty 1

## 2016-08-01 MED ORDER — ONDANSETRON HCL 4 MG PO TABS: 4.0000 mg | ORAL_TABLET | Freq: Four times a day (QID) | ORAL | Status: DC | PRN

## 2016-08-01 MED ORDER — JUVEN PO PACK
1.0000 | PACK | Freq: Two times a day (BID) | ORAL | Status: DC
  Administered 2016-08-01 – 2016-08-06 (×9): 1 via ORAL
  Filled 2016-08-01 (×14): qty 1

## 2016-08-01 MED ORDER — PANTOPRAZOLE SODIUM 40 MG PO TBEC
40.0000 mg | DELAYED_RELEASE_TABLET | Freq: Two times a day (BID) | ORAL | Status: DC
  Administered 2016-08-01 – 2016-08-07 (×14): 40 mg via ORAL
  Filled 2016-08-01 (×14): qty 1

## 2016-08-01 MED ORDER — BACLOFEN 10 MG PO TABS
20.0000 mg | ORAL_TABLET | Freq: Four times a day (QID) | ORAL | Status: DC
  Administered 2016-08-01 – 2016-08-07 (×25): 20 mg via ORAL
  Filled 2016-08-01 (×25): qty 2

## 2016-08-01 MED ORDER — BOOST PLUS PO LIQD
237.0000 mL | Freq: Every day | ORAL | Status: DC
  Administered 2016-08-01 – 2016-08-05 (×4): 237 mL via ORAL
  Filled 2016-08-01 (×9): qty 237

## 2016-08-01 MED ORDER — CLINDAMYCIN PHOSPHATE 600 MG/50ML IV SOLN
600.0000 mg | Freq: Three times a day (TID) | INTRAVENOUS | Status: DC
  Administered 2016-08-01: 600 mg via INTRAVENOUS
  Filled 2016-08-01 (×2): qty 50

## 2016-08-01 MED ORDER — ALBUTEROL SULFATE (2.5 MG/3ML) 0.083% IN NEBU: 2.5000 mg | INHALATION_SOLUTION | RESPIRATORY_TRACT | Status: DC | PRN

## 2016-08-01 MED ORDER — DOCUSATE SODIUM 100 MG PO CAPS
200.0000 mg | ORAL_CAPSULE | Freq: Two times a day (BID) | ORAL | Status: DC
  Administered 2016-08-01 – 2016-08-07 (×13): 200 mg via ORAL
  Filled 2016-08-01 (×13): qty 2

## 2016-08-01 MED ORDER — DARIFENACIN HYDROBROMIDE ER 15 MG PO TB24
15.0000 mg | ORAL_TABLET | Freq: Every day | ORAL | Status: DC
  Administered 2016-08-01 – 2016-08-07 (×6): 15 mg via ORAL
  Filled 2016-08-01 (×7): qty 1

## 2016-08-01 MED ORDER — SODIUM CHLORIDE 0.9 % IV BOLUS (SEPSIS)
500.0000 mL | Freq: Once | INTRAVENOUS | Status: AC
  Administered 2016-08-01: 500 mL via INTRAVENOUS

## 2016-08-01 MED ORDER — SODIUM CHLORIDE 0.9 % IV SOLN
INTRAVENOUS | Status: DC
  Administered 2016-08-01: 06:00:00 via INTRAVENOUS
  Administered 2016-08-03: 75 mL/h via INTRAVENOUS

## 2016-08-01 MED ORDER — FUROSEMIDE 20 MG PO TABS: 20.0000 mg | ORAL_TABLET | Freq: Two times a day (BID) | ORAL | Status: DC | PRN

## 2016-08-01 MED ORDER — ONDANSETRON HCL 4 MG/2ML IJ SOLN
4.0000 mg | Freq: Four times a day (QID) | INTRAMUSCULAR | Status: DC | PRN
  Administered 2016-08-04: 4 mg via INTRAVENOUS

## 2016-08-01 MED ORDER — FERROUS SULFATE 325 (65 FE) MG PO TABS
325.0000 mg | ORAL_TABLET | Freq: Three times a day (TID) | ORAL | Status: DC
  Administered 2016-08-01 – 2016-08-07 (×19): 325 mg via ORAL
  Filled 2016-08-01 (×19): qty 1

## 2016-08-01 NOTE — Care Management Note (Signed)
Case Management Note  Patient Details  Name: COULSON WEHNER MRN: [id number] Date of Birth: [birth year]  Subjective/Objective: Pt admitted on 07/31/16 with sacral wound infection/hx of sacral osteomyelitis.   Pt quadriplegic and admitted from home with suprapubic catheter and diverting colostomy.  Sister at bedside; states she takes care of him at home.  Pt active with [facility], per pt/family.                   Action/Plan: Pt states he is having issues with his specialty bed not working at home; will investigate and follow up with pt.  Will continue to follow for discharge planning as pt progresses.  Expected Discharge Date:                  Expected Discharge Plan:     In-House Referral:  Clinical Social Work  Discharge planning Services  CM Consult  Post Acute Care Choice:    Choice offered to:     DME Arranged:    DME Agency:     HH Arranged:    HH Agency:     Status of Service:  In process, will continue to follow  If discussed at Long Length of Stay Meetings, dates discussed:    Additional Comments:  Ella Bodo, RN 08/01/2016, 5:11 PM
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
## 2016-08-02 DIAGNOSIS — L0889 Other specified local infections of the skin and subcutaneous tissue: Secondary | ICD-10-CM

## 2016-08-02 DIAGNOSIS — B964 Proteus (mirabilis) (morganii) as the cause of diseases classified elsewhere: Secondary | ICD-10-CM

## 2016-08-02 LAB — MRSA CULTURE: CULTURE: NOT DETECTED

## 2016-08-02 LAB — TYPE AND SCREEN
ABO/RH(D): B POS
Antibody Screen: NEGATIVE
UNIT DIVISION: 0

## 2016-08-02 LAB — BPAM RBC
Blood Product Expiration Date: 201804102359
ISSUE DATE / TIME: 201803191403
UNIT TYPE AND RH: 7300

## 2016-08-02 MED ORDER — DEXTROSE 5 % IV SOLN
1.0000 g | INTRAVENOUS | Status: DC
  Administered 2016-08-02 – 2016-08-07 (×6): 1 g via INTRAVENOUS
  Filled 2016-08-02 (×6): qty 10

## 2016-08-02 MED ORDER — METRONIDAZOLE 500 MG PO TABS
500.0000 mg | ORAL_TABLET | Freq: Three times a day (TID) | ORAL | Status: DC
  Administered 2016-08-02 – 2016-08-07 (×15): 500 mg via ORAL
  Filled 2016-08-02 (×16): qty 1
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
## 2016-08-03 ENCOUNTER — Ambulatory Visit: Payer: Self-pay | Admitting: Plastic Surgery

## 2016-08-03 ENCOUNTER — Encounter (HOSPITAL_COMMUNITY): Payer: Self-pay | Admitting: *Deleted

## 2016-08-03 DIAGNOSIS — S21201A Unspecified open wound of right back wall of thorax without penetration into thoracic cavity, initial encounter: Secondary | ICD-10-CM

## 2016-08-03 LAB — CBC
HEMATOCRIT: 25.9 % — AB (ref 39.0–52.0)
HEMOGLOBIN: 7.7 g/dL — AB (ref 13.0–17.0)
MCH: 24.2 pg — ABNORMAL LOW (ref 26.0–34.0)
MCHC: 29.7 g/dL — AB (ref 30.0–36.0)
MCV: 81.4 fL (ref 78.0–100.0)
Platelets: 376 10*3/uL (ref 150–400)
RBC: 3.18 MIL/uL — ABNORMAL LOW (ref 4.22–5.81)
RDW: 16.1 % — ABNORMAL HIGH (ref 11.5–15.5)
WBC: 6.4 10*3/uL (ref 4.0–10.5)
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
## 2016-08-03 NOTE — Progress Notes (Signed)
Came to assess patient readiness for NIV for the night. Pt states that he want to use it later tonight. Instructed patient to call once he is ready to go on. He implied around midnight. Pt is stable at this time. No distress noted.

## 2016-08-03 NOTE — Care Management Note (Signed)
Case Management Note  Patient Details  Name: [name] [name] MRN: [id number] Date of Birth: [birth year]  Subjective/Objective:  51 yr old male admitted with sacral and  Back wounds. Patient is on IV antibiotics, has wound vac.                     Action/Plan: Case manager continues to follow patient for discharge plan.Patient may return to OR for I & D and VAC placement. He is active with [facility] for nursing.    Expected Discharge Date:   pending               Expected Discharge Plan:  [place]  In-House Referral:  Clinical Social Work  Discharge planning Services  CM Consult  Post Acute Care Choice:  Home Health, Resumption of Svcs/PTA Provider Choice offered to:  Patient  DME Arranged:    DME Agency:     HH Arranged:  RN [place] Agency:  [facility]  Status of Service:  In process, will continue to follow  If discussed at Long Length of Stay Meetings, dates discussed:    Additional Comments:  Ninfa Meeker, RN 08/03/2016, 2:10 PM
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
## 2016-08-04 ENCOUNTER — Inpatient Hospital Stay (HOSPITAL_COMMUNITY): Payer: Medicare Other | Admitting: Certified Registered Nurse Anesthetist

## 2016-08-04 ENCOUNTER — Encounter (HOSPITAL_COMMUNITY)
Admission: EM | Disposition: A | Discharge status: Home Health | Payer: Self-pay | Source: Home / Self Care | Attending: Family Medicine

## 2016-08-04 HISTORY — PX: APPLICATION OF A-CELL OF BACK: SHX6301

## 2016-08-04 HISTORY — PX: INCISION AND DRAINAGE OF WOUND: SHX1803

## 2016-08-04 HISTORY — PX: APPLICATION OF WOUND VAC: SHX5189

## 2016-08-04 SURGERY — IRRIGATION AND DEBRIDEMENT WOUND
Anesthesia: General | Site: Back

## 2016-08-04 MED ORDER — SUGAMMADEX SODIUM 200 MG/2ML IV SOLN
INTRAVENOUS | Status: DC | PRN
  Administered 2016-08-04: 200 mg via INTRAVENOUS

## 2016-08-04 MED ORDER — CHLORHEXIDINE GLUCONATE CLOTH 2 % EX PADS: 6.0000 | MEDICATED_PAD | Freq: Once | CUTANEOUS | Status: DC

## 2016-08-04 MED ORDER — LACTATED RINGERS IV SOLN
INTRAVENOUS | Status: DC
  Administered 2016-08-04: 13:00:00 via INTRAVENOUS

## 2016-08-04 MED ORDER — FENTANYL CITRATE (PF) 100 MCG/2ML IJ SOLN: 25.0000 ug | INTRAMUSCULAR | Status: DC | PRN

## 2016-08-04 MED ORDER — SODIUM CHLORIDE 0.9 % IR SOLN
Status: DC | PRN
  Administered 2016-08-04: 500 mL

## 2016-08-04 MED ORDER — ROCURONIUM BROMIDE 10 MG/ML (PF) SYRINGE
PREFILLED_SYRINGE | INTRAVENOUS | Status: DC | PRN
  Administered 2016-08-04: 30 mg via INTRAVENOUS

## 2016-08-04 MED ORDER — CEFAZOLIN SODIUM-DEXTROSE 2-4 GM/100ML-% IV SOLN: 2.0000 g | INTRAVENOUS | Status: AC

## 2016-08-04 MED ORDER — FENTANYL CITRATE (PF) 100 MCG/2ML IJ SOLN
INTRAMUSCULAR | Status: DC | PRN
  Administered 2016-08-04: 50 ug via INTRAVENOUS

## 2016-08-04 MED ORDER — PHENYLEPHRINE 40 MCG/ML (10ML) SYRINGE FOR IV PUSH (FOR BLOOD PRESSURE SUPPORT)
PREFILLED_SYRINGE | INTRAVENOUS | Status: DC | PRN
  Administered 2016-08-04 (×2): 80 ug via INTRAVENOUS
  Administered 2016-08-04 (×2): 120 ug via INTRAVENOUS
  Administered 2016-08-04: 80 ug via INTRAVENOUS

## 2016-08-04 MED ORDER — PROPOFOL 10 MG/ML IV BOLUS
INTRAVENOUS | Status: DC | PRN
  Administered 2016-08-04: 120 mg via INTRAVENOUS

## 2016-08-04 MED ORDER — FENTANYL CITRATE (PF) 100 MCG/2ML IJ SOLN
INTRAMUSCULAR | Status: AC
  Filled 2016-08-04: qty 4

## 2016-08-04 MED ORDER — 0.9 % SODIUM CHLORIDE (POUR BTL) OPTIME
TOPICAL | Status: DC | PRN
  Administered 2016-08-04: 1000 mL

## 2016-08-04 MED ORDER — PROMETHAZINE HCL 25 MG/ML IJ SOLN: 6.2500 mg | INTRAMUSCULAR | Status: DC | PRN

## 2016-08-04 MED ORDER — MIDAZOLAM HCL 2 MG/2ML IJ SOLN
INTRAMUSCULAR | Status: AC
  Filled 2016-08-04: qty 2

## 2016-08-04 MED ORDER — LIDOCAINE 2% (20 MG/ML) 5 ML SYRINGE
INTRAMUSCULAR | Status: DC | PRN
  Administered 2016-08-04: 80 mg via INTRAVENOUS

## 2016-08-04 SURGICAL SUPPLY — 47 items
APL SKNCLS STERI-STRIP NONHPOA (GAUZE/BANDAGES/DRESSINGS) ×1
APPLICATOR COTTON TIP 6IN STRL (MISCELLANEOUS) IMPLANT
BAG DECANTER FOR FLEXI CONT (MISCELLANEOUS) ×1 IMPLANT
BENZOIN TINCTURE PRP APPL 2/3 (GAUZE/BANDAGES/DRESSINGS) ×2 IMPLANT
CANISTER SUCT 3000ML PPV (MISCELLANEOUS) ×2 IMPLANT
CONT SPEC STER OR (MISCELLANEOUS) ×1 IMPLANT
COVER SURGICAL LIGHT HANDLE (MISCELLANEOUS) ×2 IMPLANT
DRAPE IMP U-DRAPE 54X76 (DRAPES) ×2 IMPLANT
DRAPE INCISE IOBAN 66X45 STRL (DRAPES) ×1 IMPLANT
DRAPE LAPAROSCOPIC ABDOMINAL (DRAPES) ×1 IMPLANT
DRAPE LAPAROTOMY 100X72 PEDS (DRAPES) ×1 IMPLANT
DRAPE PROXIMA HALF (DRAPES) IMPLANT
DRESSING HYDROCOLLOID 4X4 (GAUZE/BANDAGES/DRESSINGS) ×2 IMPLANT
DRSG ADAPTIC 3X8 NADH LF (GAUZE/BANDAGES/DRESSINGS) IMPLANT
DRSG CUTIMED SORBACT 7X9 (GAUZE/BANDAGES/DRESSINGS) ×1 IMPLANT
DRSG PAD ABDOMINAL 8X10 ST (GAUZE/BANDAGES/DRESSINGS) IMPLANT
DRSG VAC ATS LRG SENSATRAC (GAUZE/BANDAGES/DRESSINGS) IMPLANT
DRSG VAC ATS MED SENSATRAC (GAUZE/BANDAGES/DRESSINGS) ×1 IMPLANT
DRSG VAC ATS SM SENSATRAC (GAUZE/BANDAGES/DRESSINGS) IMPLANT
ELECT CAUTERY BLADE 6.4 (BLADE) ×1 IMPLANT
ELECT REM PT RETURN 9FT ADLT (ELECTROSURGICAL) ×2
ELECTRODE REM PT RTRN 9FT ADLT (ELECTROSURGICAL) ×1 IMPLANT
GAUZE SPONGE 4X4 12PLY STRL (GAUZE/BANDAGES/DRESSINGS) IMPLANT
GEL ULTRASOUND 20GR AQUASONIC (MISCELLANEOUS) ×1 IMPLANT
GLOVE BIO SURGEON STRL SZ 6.5 (GLOVE) ×4 IMPLANT
GOWN STRL REUS W/ TWL LRG LVL3 (GOWN DISPOSABLE) ×3 IMPLANT
GOWN STRL REUS W/TWL LRG LVL3 (GOWN DISPOSABLE) ×6
KIT BASIN OR (CUSTOM PROCEDURE TRAY) ×2 IMPLANT
KIT ROOM TURNOVER OR (KITS) ×2 IMPLANT
MATRIX SURGICAL PSM 7X10CM (Tissue) ×1 IMPLANT
MICROMATRIX 1000MG (Tissue) ×2 IMPLANT
NDL HYPO 25GX1X1/2 BEV (NEEDLE) ×1 IMPLANT
NEEDLE HYPO 25GX1X1/2 BEV (NEEDLE) ×2 IMPLANT
NS IRRIG 1000ML POUR BTL (IV SOLUTION) ×2 IMPLANT
PACK GENERAL/GYN (CUSTOM PROCEDURE TRAY) ×2 IMPLANT
PACK UNIVERSAL I (CUSTOM PROCEDURE TRAY) ×2 IMPLANT
PAD ARMBOARD 7.5X6 YLW CONV (MISCELLANEOUS) ×4 IMPLANT
SOLUTION PARTIC MCRMTRX 1000MG (Tissue) IMPLANT
STAPLER VISISTAT 35W (STAPLE) ×2 IMPLANT
SURGILUBE 2OZ TUBE FLIPTOP (MISCELLANEOUS) IMPLANT
SUT MNCRL AB 4-0 PS2 18 (SUTURE) IMPLANT
SUT VIC AB 5-0 PS2 18 (SUTURE) ×2 IMPLANT
SWAB COLLECTION DEVICE MRSA (MISCELLANEOUS) IMPLANT
SYR CONTROL 10ML LL (SYRINGE) ×2 IMPLANT
TOWEL OR 17X26 10 PK STRL BLUE (TOWEL DISPOSABLE) ×2 IMPLANT
TUBE ANAEROBIC SPECIMEN COL (MISCELLANEOUS) IMPLANT
UNDERPAD 30X30 (UNDERPADS AND DIAPERS) ×2 IMPLANT

## 2016-08-04 NOTE — Interval H&P Note (Signed)
History and Physical Interval Note:  08/04/2016 12:00 PM  [name] [name]  has presented today for surgery, with the diagnosis of back wound  The various methods of treatment have been discussed with the patient and family. After consideration of risks, benefits and other options for treatment, the patient has consented to  Procedure(s): IRRIGATION AND DEBRIDEMENT back WOUND (N/A) APPLICATION OF A-CELL OF BACK (N/A) APPLICATION OF WOUND VAC to back (N/A) as a surgical intervention .  The patient's history has been reviewed, patient examined, no change in status, stable for surgery.  I have reviewed the patient's chart and labs.  Questions were answered to the patient's satisfaction.     Wallace Going

## 2016-08-04 NOTE — Anesthesia Postprocedure Evaluation (Signed)
Anesthesia Post Note  Patient: [name] [name]  Procedure(s) Performed: Procedure(s) (LRB): IRRIGATION AND DEBRIDEMENT back WOUND (N/A) APPLICATION OF A-CELL OF BACK (N/A) APPLICATION OF WOUND VAC to back (N/A)  Patient location during evaluation: PACU Anesthesia Type: General Level of consciousness: awake and alert Pain management: pain level controlled Vital Signs Assessment: post-procedure vital signs reviewed and stable Respiratory status: spontaneous breathing, nonlabored ventilation, respiratory function stable and patient connected to nasal cannula oxygen Cardiovascular status: blood pressure returned to baseline and stable Postop Assessment: no signs of nausea or vomiting Anesthetic complications: no       Last Vitals:  Vitals:   08/04/16 1433 08/04/16 1445  BP: (!) 119/99 (!) 137/91  Pulse: 74 85  Resp: 20 13  Temp: 36.1 C 36.1 C    Last Pain:  Vitals:   08/04/16 1433  TempSrc:   PainSc: 0-No pain                 Tiajuana Amass
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
## 2016-08-04 NOTE — Progress Notes (Signed)
Pt is on NIV at this time tolerating it well.  

## 2016-08-04 NOTE — Anesthesia Procedure Notes (Signed)
Procedure Name: Intubation Date/Time: 08/04/2016 1:39 PM Performed by: Mervyn Gay Pre-anesthesia Checklist: Patient identified, Patient being monitored, Timeout performed, Emergency Drugs available and Suction available Patient Re-evaluated:Patient Re-evaluated prior to inductionOxygen Delivery Method: Circle System Utilized Preoxygenation: Pre-oxygenation with 100% oxygen Intubation Type: IV induction Ventilation: Mask ventilation without difficulty and Oral airway inserted - appropriate to patient size Laryngoscope Size: Miller and 3 Grade View: Grade IV Tube type: Oral Tube size: 7.5 mm Number of attempts: 1 Airway Equipment and Method: Stylet Placement Confirmation: ETT inserted through vocal cords under direct vision,  positive ETCO2 and breath sounds checked- equal and bilateral Secured at: 21 cm Tube secured with: Tape Dental Injury: Teeth and Oropharynx as per pre-operative assessment  Comments: Recommend glidescope available
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
## 2016-08-04 NOTE — Progress Notes (Signed)
Patient ID: [name] [name], male   DOB: [birth year], 51 y.o.   MRN: [id number]          [facility]    Date of Admission:  07/31/2016   Total days of antibiotics 7         Mandeep has developed recurrent infection of his chronic right upper back pressure wound. He underwent debridement by Dr. Audelia Hives today. I spoke with her and she informed me that there is no longer any exposed bone. She did not feel there were any tissue samples worth sending for stain and culture. Overall it sounds like the wound is doing much better than when he was treated for osteomyelitis back in December. I will plan on 2 more weeks of IV ceftriaxone and metronidazole.  Diagnosis: Recurrent decubitus wound infection  Culture Result: Proteus mirabilis  Allergies  Allergen Reactions  . Feraheme [Ferumoxytol] Other (See Comments)    SYNCOPE  . Ditropan [Oxybutynin] Other (See Comments)    Hallucinations   . Vancomycin Other (See Comments)    ARF 05-2016    Discharge antibiotics: Per pharmacy protocol IV ceftriaxone and oral metronidazole  Duration: 2 more weeks End Date: 08/18/2016  [facility] Care Per Protocol:  Labs weekly while on IV antibiotics: _x_ CBC with differential _x_ BMP __ CMP __ CRP __ ESR __ Vancomycin trough  __ Please pull PIC at completion of IV antibiotics _NA (he has a Port-A-Cath)_ Please leave PIC in place until doctor has seen patient or been notified  Fax weekly labs to [phone]  Clinic Follow Up Appt: He has a previously scheduled follow-up visit in our clinic with [name] on 09/08/2016         Michel Bickers, [place] for [place] [phone] pager   [phone] cell 08/04/2016, 3:12 PM
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
## 2016-08-04 NOTE — Op Note (Addendum)
DATE OF OPERATION: 08/04/2016  LOCATION: [facility] Inpatient  PREOPERATIVE DIAGNOSIS: Right Back wound  POSTOPERATIVE DIAGNOSIS: Same  PROCEDURE: Excisional debridement of right back wound skin and subcutaneous tissue 5 x 7 x 1.5 cm with placement of Acell (1 gm and 7 x 10 cm sheet) and VAC  SURGEON: Claire Sanger Dillingham, DO  ASSISTANT: Shawn Rayburn, PA  EBL: 10 cc  CONDITION: Stable  COMPLICATIONS: None  INDICATION: The patient, [name] [name], is a 51 y.o. male born on 1966-04-14, is here for treatment of a chronic right back ulcer.  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   PROCEDURE DETAILS:  The patient was seen prior to surgery and marked.  The IV antibiotics were given. The patient was taken to the operating room and given a general anesthetic. A standard time out was performed and all information was confirmed by those in the room. SCDs were placed.   The patient was placed in the left lateral position.  The back was prepped and draped.  The wound was irrigated with antibiotic solution and saline.  The #10 blade was used to debride the 5 x 7 cm wound from nonviable skin and soft tissue and fat.  Hemostasis was achieved with electrocautery.  All of the Acell powder and 6 x 7 cm of the Acell sheet was placed and secured with 5-0 Vicryl.  Sorbact was applied over the Acell followed by the KY gel and the VAC. The patient was allowed to wake up and taken to recovery room in stable condition at the end of the case. The family was notified at the end of the case.

## 2016-08-04 NOTE — Transfer of Care (Signed)
Immediate Anesthesia Transfer of Care Note  Patient: [name] [name]  Procedure(s) Performed: Procedure(s): IRRIGATION AND DEBRIDEMENT back WOUND (N/A) APPLICATION OF A-CELL OF BACK (N/A) APPLICATION OF WOUND VAC to back (N/A)  Patient Location: PACU  Anesthesia Type:General  Level of Consciousness: awake, alert , oriented and patient cooperative  Airway & Oxygen Therapy: Patient Spontanous Breathing and Patient connected to face mask oxygen  Post-op Assessment: Report given to RN and Post -op Vital signs reviewed and stable  Post vital signs: Reviewed and stable  Last Vitals:  Vitals:   08/04/16 0607 08/04/16 1433  BP: (!) 146/96 (!) (P) 119/99  Pulse: 92   Resp: 18   Temp: 36.9 C (P) 36.1 C    Last Pain:  Vitals:   08/04/16 1433  TempSrc:   PainSc: (P) 0-No pain         Complications: No apparent anesthesia complications

## 2016-08-05 ENCOUNTER — Encounter (HOSPITAL_COMMUNITY): Payer: Self-pay | Admitting: Plastic Surgery

## 2016-08-05 LAB — CULTURE, BLOOD (ROUTINE X 2)
CULTURE: NO GROWTH
Culture: NO GROWTH

## 2016-08-05 IMAGING — CR DG CHEST 1V
1 series · 1 of 1 positions shown · non-contrast
Comparison: 09/23/2013.

CLINICAL DATA: Central venous catheter placement.

EXAM:
CHEST - 1 VIEW

[AP]
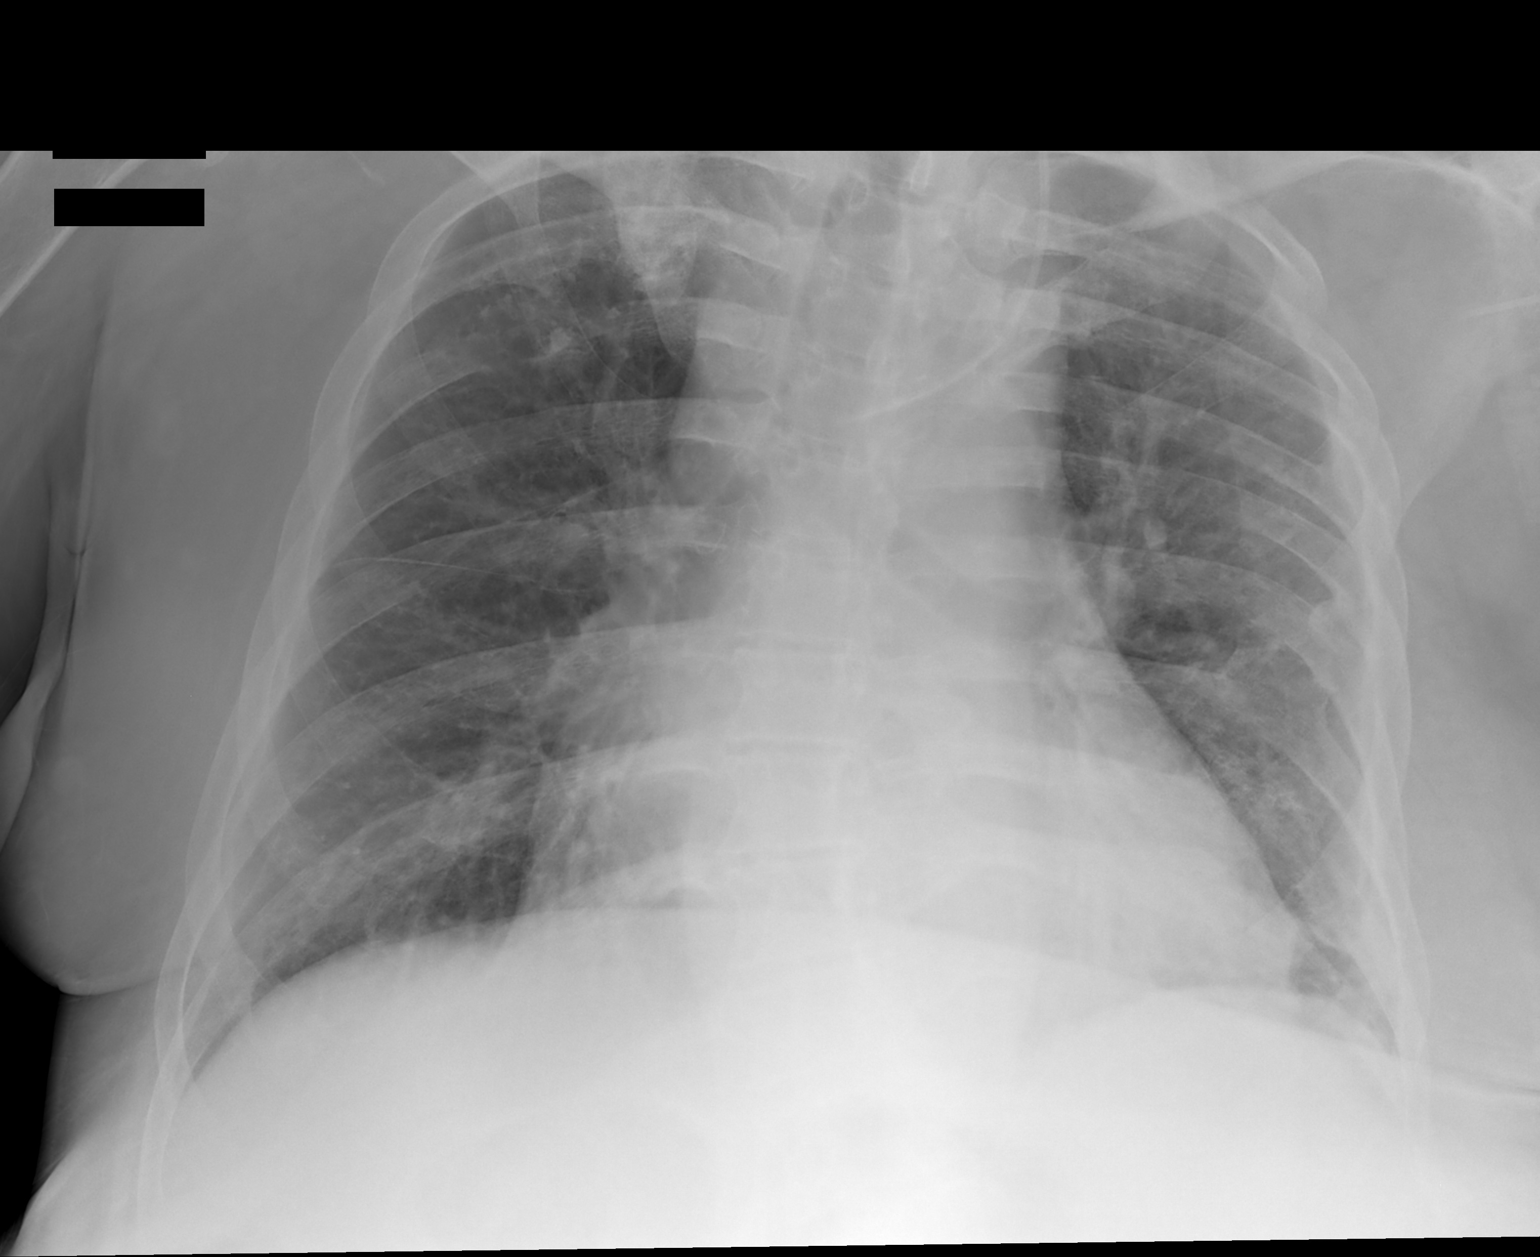

[1 of 1 positions shown; findings below may reference images not displayed]

FINDINGS: Increased cardiac silhouette. Clear lung fields. A central venous
catheter has been placed from a LEFT IJ approach. Tip lies in the
LEFT innominate vein. There is no pneumothorax. No osseous findings.
IMPRESSION: LEFT central venous catheter tip lies in the LEFT innominate vein.
There is no visible pneumothorax.

## 2016-08-05 MED ORDER — CEFTRIAXONE IV (FOR PTA / DISCHARGE USE ONLY): 1.0000 g | INTRAVENOUS | 0 refills | Status: AC

## 2016-08-05 MED ORDER — METRONIDAZOLE 500 MG PO TABS: 500.0000 mg | ORAL_TABLET | Freq: Three times a day (TID) | ORAL | 0 refills | Status: AC

## 2016-08-05 NOTE — Care Management (Signed)
Case manager confirmed that patient will not discharge on 08/07/16. [name] [name] is in position for resumption of care, [facility] is following to provide IV antibiotics.

## 2016-08-05 NOTE — Progress Notes (Signed)
PHARMACY CONSULT NOTE FOR:  OUTPATIENT  PARENTERAL ANTIBIOTIC THERAPY (OPAT)  Indication: recurrent decubitus wound infection Regimen: Rocephin 1gm IV Q24H + Flagyl 500mg  PO TID End date: 08/18/16  IV antibiotic discharge orders are pended. To discharging provider:  please sign these orders via discharge navigator,  Select New Orders & click on the button choice - Manage This Unsigned Work.      Rhylen Pulido D. Mina Marble, PharmD, BCPS Pager:  [phone] 08/05/2016, 9:41 AM

## 2016-08-05 NOTE — Progress Notes (Signed)
Pt is on NIV at this time tolerating it well.  

## 2016-08-05 NOTE — Discharge Instructions (Signed)
[name],  You were admitted because of an infected wound. You will be discharged on antibiotics for a total of 2 weeks. Please follow-up with the wound clinic for your wound management.
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
## 2016-08-05 NOTE — Progress Notes (Signed)
[facility] pt for [facility] this admission though [place] has received our services in the past.    [facility] team will provide : Rocephin 2 Grams IV Q 24 hours thru 08-18-16 as ordered by ID team.[facility] is prepared for weekend DC.  [facility] is providing [facility] services for [place]. I have spoken with Candie Chroman, RN with [facility] and they are prepared for DC as well.  If patient discharges after hours, please call [phone].   Larry Sierras 08/05/2016, 4:16 PM
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
## 2016-08-06 DIAGNOSIS — S21201A Unspecified open wound of right back wall of thorax without penetration into thoracic cavity, initial encounter: Secondary | ICD-10-CM

## 2016-08-06 NOTE — Progress Notes (Signed)
Pt is on NIV at this time tolerating it well.  

## 2016-08-06 NOTE — Progress Notes (Signed)
Chief Complaint  Patient presents with  . Weakness   No fevers overnight. No vomiting.  BP (!) 146/88 (BP Location: Right Arm)   Pulse 88   Temp 98.1 F (36.7 C) (Axillary)   Resp 15   SpO2 100%    Sacral decubitus ulcer is s/p irrigation and debridement on 08/04/2016. Continue ceftriaxone and metronidazole.  Cordelia Poche, MD [facility] 08/06/2016, 12:20 PM Pager: [phone]

## 2016-08-06 NOTE — Progress Notes (Signed)
CM has called [employer] rep, Liliane Bade [phone] who requested I fax signed prescription, facesheet, and op note with measurements to [place] at [phone]; Faxed and received confirmation of receipt.  Waiting for [facility] to be delivered to room for discharge.

## 2016-08-07 MED ORDER — HEPARIN SOD (PORK) LOCK FLUSH 100 UNIT/ML IV SOLN
500.0000 [IU] | INTRAVENOUS | Status: AC | PRN
  Administered 2016-08-07: 500 [IU]

## 2016-08-07 MED ORDER — HEPARIN SOD (PORK) LOCK FLUSH 100 UNIT/ML IV SOLN
500.0000 [IU] | Freq: Once | INTRAVENOUS | Status: DC
  Filled 2016-08-07: qty 5

## 2016-08-07 NOTE — Progress Notes (Signed)
[facility] called with instructions to pick up patient after 10pm

## 2016-08-07 NOTE — Progress Notes (Signed)
This RN called [facility] for verification that patient will be picked up after 10pm. Received verification that everything was correctly in place. Will pack up personal items and connect portable wound vac.

## 2016-08-07 NOTE — Progress Notes (Signed)
Patient is ready to be d/c. [facility] here. Personal items packed. Wound vac connected and in place. Chest port de-assessed. AVS given along with papers for [facility]

## 2016-08-07 NOTE — Progress Notes (Signed)
CM received call from Liliane Bade who has left transfer VAC in room for ambulance transport home and will deliver the standard Wound VAC tomorrow at the pt's home. Plan is for pt to go home this evening by [facility].  CM has contacted [facility] rep, Adacia who states sll care will begin tomorrow 08/08/16; Colman Cater has been given [employer] rep Ricky Toye's contact information as standard VAC will be delivered to home tomorrow. CM  Left [facility] packet at [place] with number to call when pt is ready.  CM has No other Cm needs were communicated.

## 2016-08-07 NOTE — Progress Notes (Signed)
Chief Complaint  Patient presents with  . Weakness   No fevers overnight. No vomiting. Unable to leave last night secondary to not having wound vac delivered  BP (!) 83/54 (BP Location: Left Arm)   Pulse 78   Temp 97.9 F (36.6 C) (Axillary)   Resp 16   SpO2 100%    Sacral decubitus ulcer is s/p irrigation and debridement on 08/04/2016. Continue ceftriaxone and metronidazole. Wound vac on discharge.  Cordelia Poche, MD [facility] 08/07/2016, 2:12 PM Pager: [phone]

## 2016-08-07 NOTE — Progress Notes (Addendum)
Received home wound vac. Patient ready for DC. Patient sister will be available to received patient  after 10 pm tonight. Patient states that he is unable to make any other arrangements. [facility] informed of patient request for after hours transport. I will call [facility] again around 6pm for after hours requisition.

## 2016-08-07 NOTE — Progress Notes (Signed)
Case manager Sarah contacted regarding progress on DME-voice message left.

## 2016-08-08 ENCOUNTER — Ambulatory Visit: Payer: Medicare Other | Admitting: Infectious Diseases

## 2016-08-08 DIAGNOSIS — T8189XA Other complications of procedures, not elsewhere classified, initial encounter: Secondary | ICD-10-CM | POA: Diagnosis not present

## 2016-08-08 DIAGNOSIS — L89159 Pressure ulcer of sacral region, unspecified stage: Secondary | ICD-10-CM | POA: Diagnosis not present

## 2016-08-08 IMAGING — MR MR PELVIS WO/W CM
4 of 10 series · 18 of 48 positions shown · IV contrast (multihance)
Comparison: CT pelvis 02/27/2013. MRI of the pelvis 09/23/2013 and
06/26/2013.

CLINICAL DATA: Quadriplegic patient with nonhealing decubitus
ulcers.

EXAM:
MRI PELVIS WITHOUT AND WITH CONTRAST
TECHNIQUE: Multiplanar multisequence MR imaging of the pelvis was performed
both before and after administration of intravenous contrast.
CONTRAST:  20 mL MULTIHANCE GADOBENATE DIMEGLUMINE 529 MG/ML IV SOLN

[Series 4: T2 · coronal · 5.0mm · 0.66mm/px · 5 of 32 slices shown]
[im 1/32]
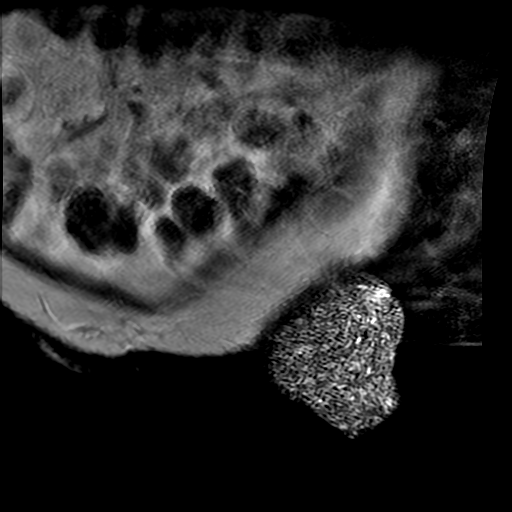
[im 8/32]
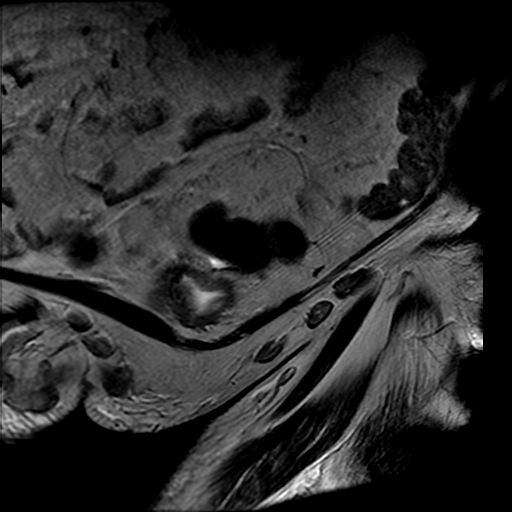
[im 16/32]
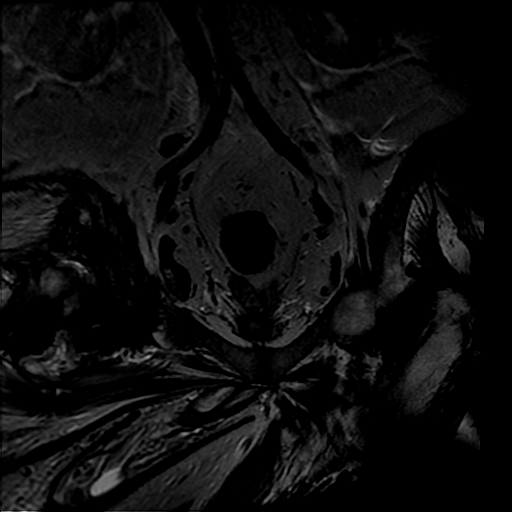
[im 24/32]
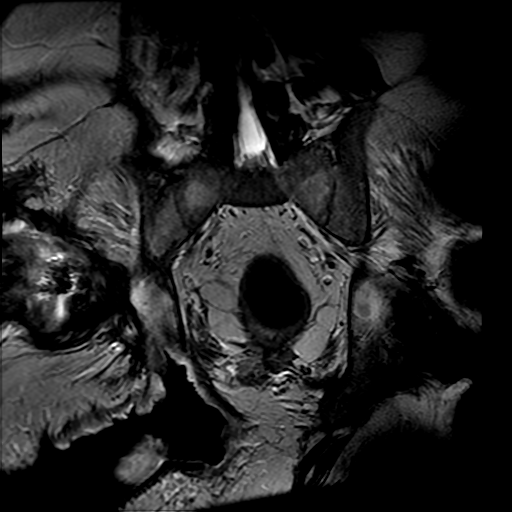
[im 32/32]
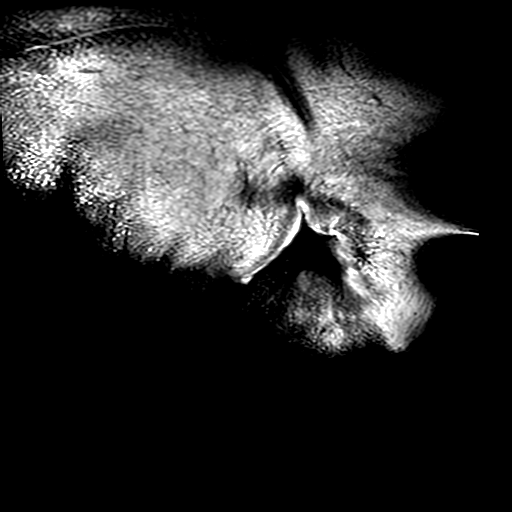

[Series 5: T2 fat-sat · axial · 6.0mm · 0.70mm/px · z∈[+24,+222]mm · 5 of 31 slices shown (1 of 2)]
[im 1/31]
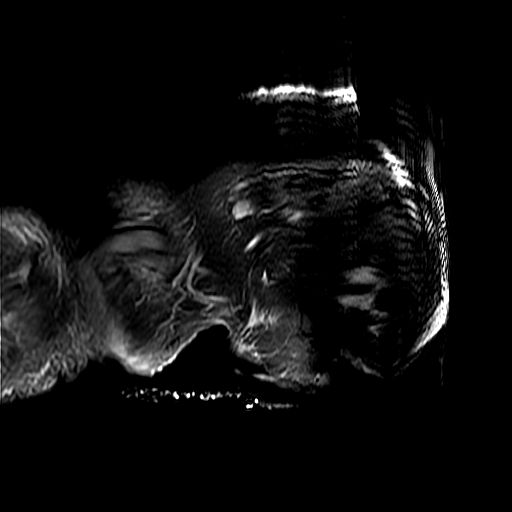
[im 8/31]
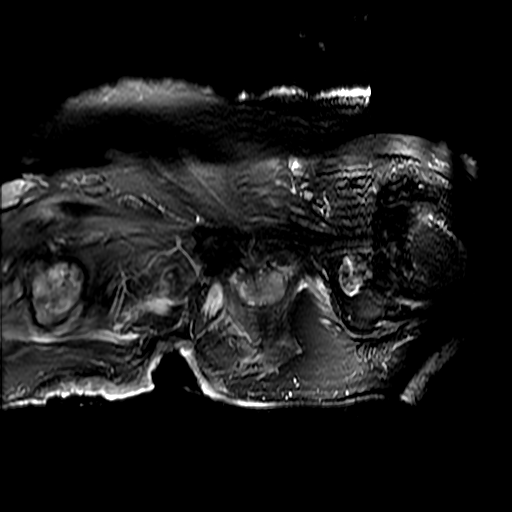
[im 16/31]
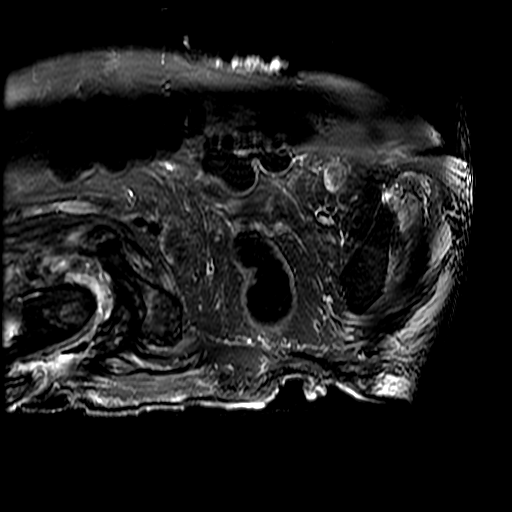
[im 23/31]
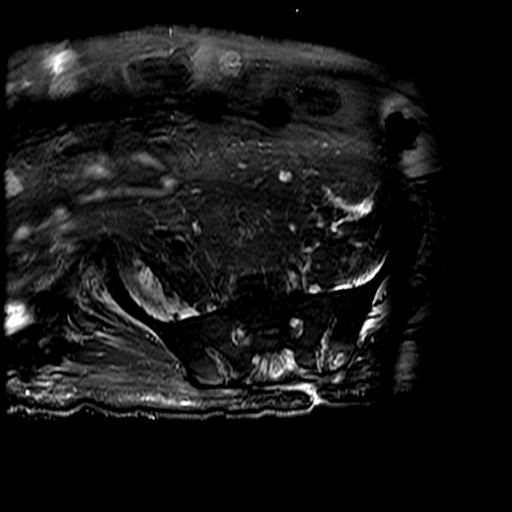
[im 31/31]
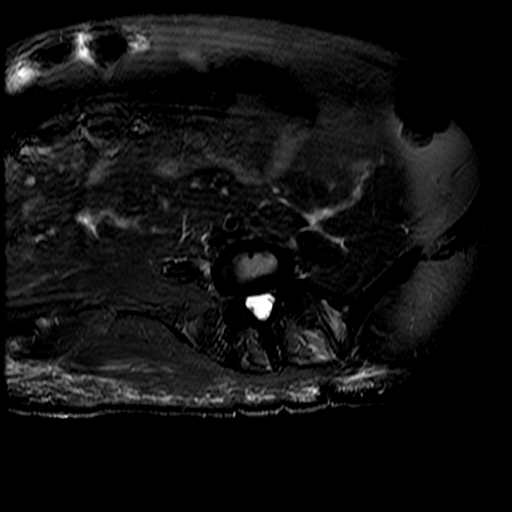

[Series 6: T2 fat-sat · sagittal · 5.0mm · 0.62mm/px · 5 of 45 slices shown (2 of 2)]
[im 1/45]
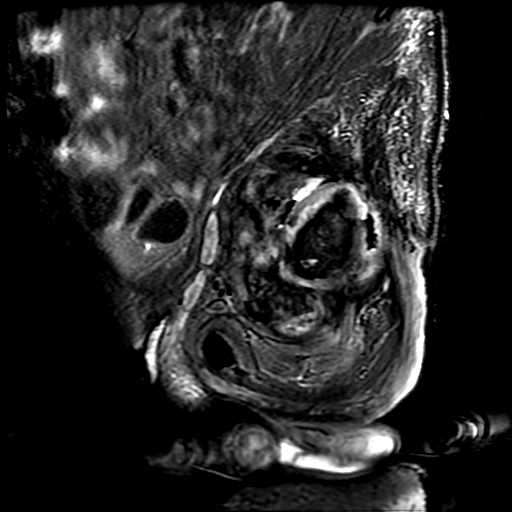
[im 8/45]
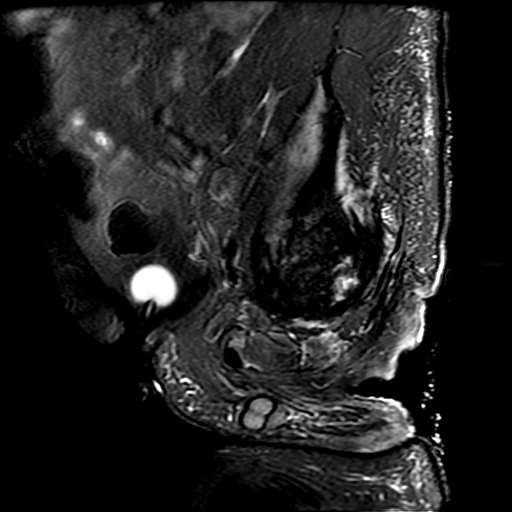
[im 15/45]
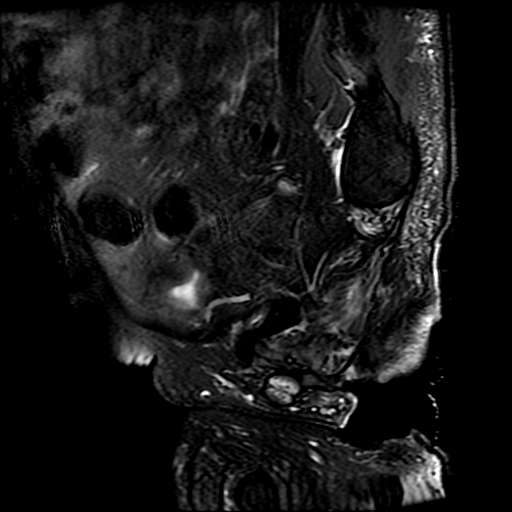
[im 23/45]
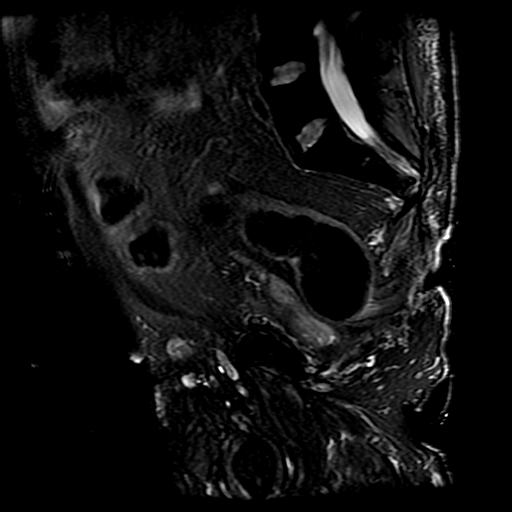
[im 37/45]
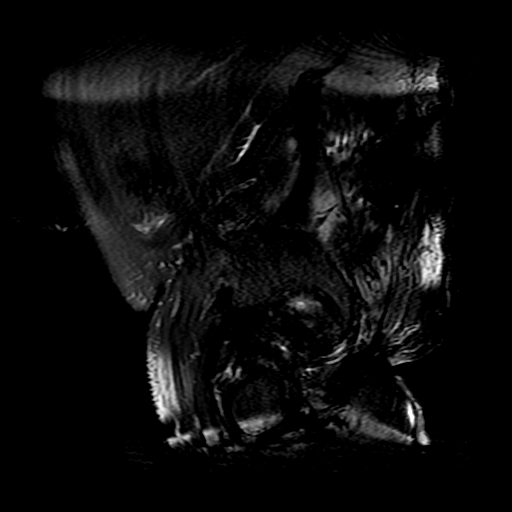

[Series 7: T1 fat-sat · axial · 6.0mm · 0.70mm/px · z∈[+24,+222]mm · 3 of 31 slices shown]
[im 1/31]
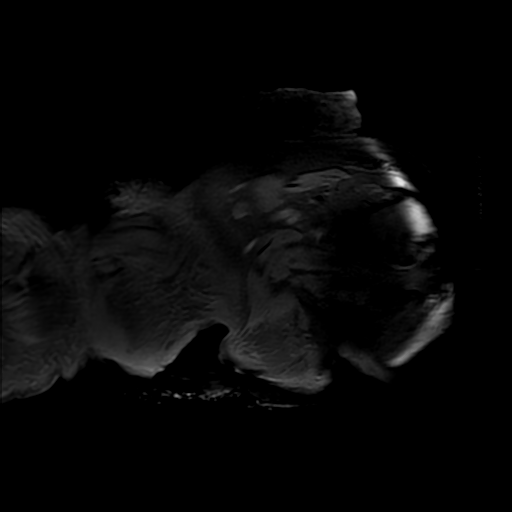
[im 21/31]
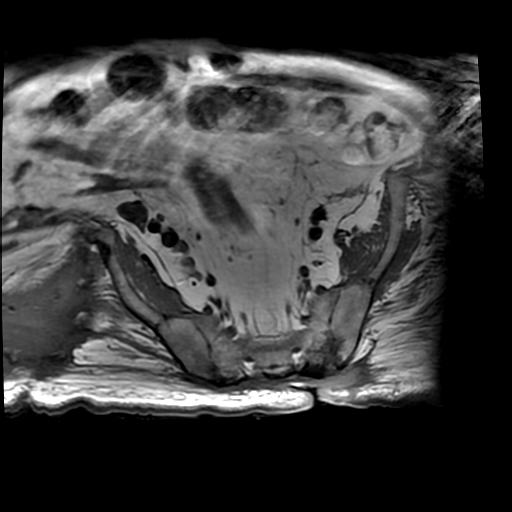
[im 31/31]
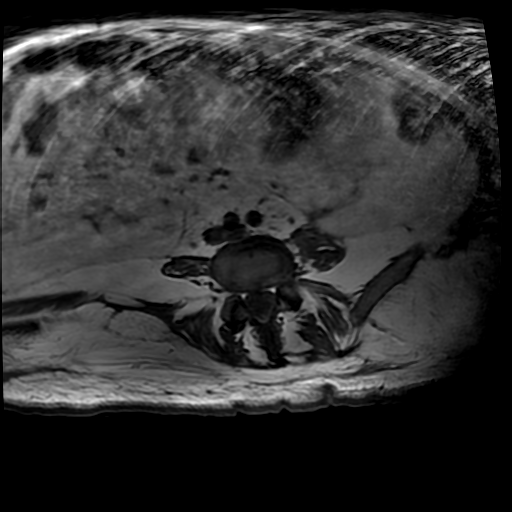

[18 of 48 positions shown; findings below may reference images not displayed]

FINDINGS: Large decubitus ulcers are again identified. Ulcer about the
posterior aspect of the right hip is seen with an air and fluid
collection off of the lateral margin of the right ilium on the right
gluteal musculature measuring 7.6 cm transverse by 2.6 cm AP by
cm craniocaudal. The collection is highly suspicious for abscess and
appears to have a thin band of communication to the skin surface.
Inferior to the right hip, a second fluid collection measuring
cm craniocaudal by 5.0 cm AP x 3.5 cm transverse which also
demonstrates marked rim enhancement and is worrisome for abscess.
Osteolysis of the right femoral head is identified. Markedly
decreased T1 and T2 signal in the visualized proximal right femurs
consistent with chronic osteomyelitis. Small focus of marrow edema
in the right ilium is unchanged.

A large sacral decubitus ulcer is identified as on the prior
studies. Small focus of edema and enhancement in the posterior
aspect of the left ilium adjacent to the sacroiliac joint is
unchanged to slightly smaller and compatible with chronic
osteomyelitis. No fluid collection is identified.

Decubitus ulcer over the left hip is poorly seen due to artifact.
Osteolysis of the left femoral head is unchanged. There is a fluid
collection in the left hip just below the roof of the acetabulum
measuring 2.5 cm transverse by 1.7 cm craniocaudal by 2.3 cm AP. The
collection is not definitely seen on the prior CT scan and is
worrisome for abscess.
IMPRESSION: Dominant finding are new fluid collections in the right buttock
adjacent to the right ilium and deep to the proximal right femur
highly suspicious for abscesses. The collection in the right buttock
appears to have a thin communication to the skin surface.

Small fluid collection in the left hip just deep to the roof of the
left left acetabulum is not definitely seen on the prior
examinations and worrisome for abscess.

Sacral decubitus ulcer without underlying abscess.

Osteolysis and chronic osteomyelitis of both hips. Chronic
osteomyelitis deep to the patient's sacral decubitus ulcer is also
identified.

## 2016-08-10 ENCOUNTER — Ambulatory Visit: Payer: Medicare Other | Admitting: Hematology

## 2016-08-10 DIAGNOSIS — I10 Essential (primary) hypertension: Secondary | ICD-10-CM | POA: Diagnosis not present

## 2016-08-11 ENCOUNTER — Telehealth: Payer: Self-pay | Admitting: Hematology

## 2016-08-11 NOTE — Telephone Encounter (Signed)
Left message for patient re 4/6 appointments and mailed schedule.

## 2016-08-12 DIAGNOSIS — L89323 Pressure ulcer of left buttock, stage 3: Secondary | ICD-10-CM | POA: Diagnosis not present

## 2016-08-12 DIAGNOSIS — L89894 Pressure ulcer of other site, stage 4: Secondary | ICD-10-CM | POA: Diagnosis not present

## 2016-08-12 DIAGNOSIS — G8250 Quadriplegia, unspecified: Secondary | ICD-10-CM | POA: Diagnosis not present

## 2016-08-12 DIAGNOSIS — L89154 Pressure ulcer of sacral region, stage 4: Secondary | ICD-10-CM | POA: Diagnosis present

## 2016-08-12 DIAGNOSIS — L89314 Pressure ulcer of right buttock, stage 4: Secondary | ICD-10-CM | POA: Diagnosis not present

## 2016-08-12 DIAGNOSIS — L97822 Non-pressure chronic ulcer of other part of left lower leg with fat layer exposed: Secondary | ICD-10-CM | POA: Diagnosis not present

## 2016-08-12 DIAGNOSIS — L89893 Pressure ulcer of other site, stage 3: Secondary | ICD-10-CM | POA: Diagnosis not present

## 2016-08-12 DIAGNOSIS — I10 Essential (primary) hypertension: Secondary | ICD-10-CM | POA: Diagnosis not present

## 2016-08-12 DIAGNOSIS — G4730 Sleep apnea, unspecified: Secondary | ICD-10-CM | POA: Diagnosis not present

## 2016-08-12 DIAGNOSIS — M8669 Other chronic osteomyelitis, multiple sites: Secondary | ICD-10-CM | POA: Diagnosis not present

## 2016-08-17 IMAGING — XA IR FLUORO GUIDE CV LINE*R*
1 series · 1 of 1 positions shown · non-contrast
Comparison: none

CLINICAL DATA: Infected sacral decubitus ulcer and osteomyelitis.
The patient requires a central venous catheter for further IV
antibiotic treatment.

[Series 300: line placements · 1 of 1 slices shown]
[im 1/1]
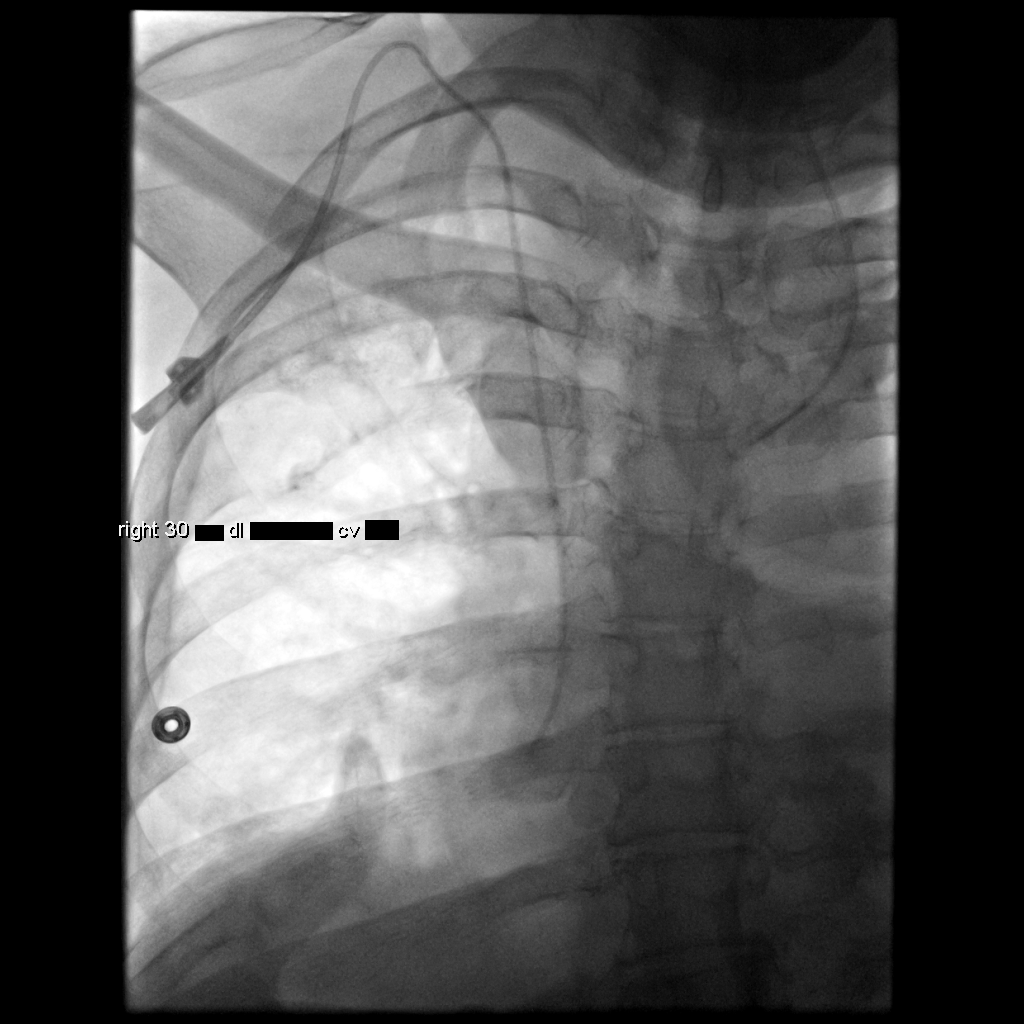

[1 of 1 positions shown; findings below may reference images not displayed]

EXAM:
TUNNELED CENTRAL VENOUS CATHETER PLACEMENT WITH ULTRASOUND AND
FLUOROSCOPIC GUIDANCE

ANESTHESIA/SEDATION:
Local anesthetic with 1% lidocaine.

MEDICATIONS:
No additional medications.

FLUOROSCOPY TIME:  24 seconds.

PROCEDURE:
The procedure, risks, benefits, and alternatives were explained to
the patient. Questions regarding the procedure were encouraged and
answered. The patient understands and consents to the procedure.

The right neck and chest were prepped with chlorhexidine in a
sterile fashion, and a sterile drape was applied covering the
operative field. Maximum barrier sterile technique with sterile
gowns and gloves were used for the procedure. Local anesthesia was
provided with 1% lidocaine.

Ultrasound was used to confirm patency of the right internal jugular
vein. After creating a small venotomy incision, a 21 gauge needle
was advanced into the right internal jugular vein under direct,
real-time ultrasound guidance. Ultrasound image documentation was
performed. After securing guidewire access, a 6 French peel-away
sheath was placed. A wire was kinked to measure appropriate catheter
length.

A 6 French, dual-lumen power line tunneled catheter measuring 30 cm
from tip to cuff was placed. This was tunneled in a retrograde
fashion from the chest wall to the venotomy incision. The catheter
was then placed through the peel-away sheath and the sheath removed.
Final catheter positioning was confirmed and documented with a
fluoroscopic spot image. The catheter was aspirated and flushed with
saline.

The venotomy incision was closed with subcuticular 4-0 Vicryl.
Dermabond was applied to the incision. The catheter exit site was
secured with 0-Prolene and 3-0 Ethilon retention sutures.

COMPLICATIONS:
None.  No pneumothorax.
FINDINGS: After catheter placement, the tip lies at the cavoatrial junction.
The catheter aspirates normally and is ready for immediate use.
IMPRESSION: Placement of tunneled central venous catheter via the right internal
jugular vein. The catheter tip lies at the cavoatrial junction. The
catheter is ready for immediate use.

## 2016-08-19 ENCOUNTER — Ambulatory Visit: Payer: Medicare Other | Admitting: Hematology

## 2016-08-19 ENCOUNTER — Telehealth: Payer: Self-pay | Admitting: *Deleted

## 2016-08-19 ENCOUNTER — Other Ambulatory Visit: Payer: Medicare Other

## 2016-08-19 ENCOUNTER — Ambulatory Visit: Payer: Medicare Other

## 2016-08-19 NOTE — Telephone Encounter (Signed)
Entered in error

## 2016-08-24 DIAGNOSIS — Z435 Encounter for attention to cystostomy: Secondary | ICD-10-CM | POA: Diagnosis not present

## 2016-08-24 DIAGNOSIS — L89313 Pressure ulcer of right buttock, stage 3: Secondary | ICD-10-CM | POA: Diagnosis not present

## 2016-08-24 DIAGNOSIS — I872 Venous insufficiency (chronic) (peripheral): Secondary | ICD-10-CM | POA: Diagnosis not present

## 2016-08-24 DIAGNOSIS — L89150 Pressure ulcer of sacral region, unstageable: Secondary | ICD-10-CM | POA: Diagnosis not present

## 2016-08-24 DIAGNOSIS — G8253 Quadriplegia, C5-C7 complete: Secondary | ICD-10-CM | POA: Diagnosis not present

## 2016-08-24 DIAGNOSIS — L97811 Non-pressure chronic ulcer of other part of right lower leg limited to breakdown of skin: Secondary | ICD-10-CM | POA: Diagnosis not present

## 2016-08-24 DIAGNOSIS — D649 Anemia, unspecified: Secondary | ICD-10-CM | POA: Diagnosis not present

## 2016-08-24 DIAGNOSIS — L89894 Pressure ulcer of other site, stage 4: Secondary | ICD-10-CM | POA: Diagnosis not present

## 2016-08-24 DIAGNOSIS — S12400S Unspecified displaced fracture of fifth cervical vertebra, sequela: Secondary | ICD-10-CM | POA: Diagnosis not present

## 2016-08-24 DIAGNOSIS — L89892 Pressure ulcer of other site, stage 2: Secondary | ICD-10-CM | POA: Diagnosis not present

## 2016-08-25 DIAGNOSIS — L89154 Pressure ulcer of sacral region, stage 4: Secondary | ICD-10-CM | POA: Diagnosis not present

## 2016-08-25 DIAGNOSIS — Z7409 Other reduced mobility: Secondary | ICD-10-CM | POA: Diagnosis not present

## 2016-08-26 ENCOUNTER — Encounter (HOSPITAL_BASED_OUTPATIENT_CLINIC_OR_DEPARTMENT_OTHER): Payer: Medicare Other | Attending: Internal Medicine

## 2016-08-26 DIAGNOSIS — L89103 Pressure ulcer of unspecified part of back, stage 3: Secondary | ICD-10-CM | POA: Diagnosis not present

## 2016-08-26 DIAGNOSIS — L89892 Pressure ulcer of other site, stage 2: Secondary | ICD-10-CM | POA: Diagnosis not present

## 2016-08-26 DIAGNOSIS — I10 Essential (primary) hypertension: Secondary | ICD-10-CM | POA: Diagnosis not present

## 2016-08-26 DIAGNOSIS — G8250 Quadriplegia, unspecified: Secondary | ICD-10-CM | POA: Insufficient documentation

## 2016-08-26 DIAGNOSIS — L97812 Non-pressure chronic ulcer of other part of right lower leg with fat layer exposed: Secondary | ICD-10-CM | POA: Insufficient documentation

## 2016-08-26 DIAGNOSIS — K2211 Ulcer of esophagus with bleeding: Secondary | ICD-10-CM | POA: Diagnosis not present

## 2016-08-26 DIAGNOSIS — J9811 Atelectasis: Secondary | ICD-10-CM | POA: Diagnosis not present

## 2016-08-26 DIAGNOSIS — L89893 Pressure ulcer of other site, stage 3: Secondary | ICD-10-CM | POA: Diagnosis not present

## 2016-08-26 DIAGNOSIS — L97822 Non-pressure chronic ulcer of other part of left lower leg with fat layer exposed: Secondary | ICD-10-CM | POA: Diagnosis not present

## 2016-08-26 DIAGNOSIS — R112 Nausea with vomiting, unspecified: Secondary | ICD-10-CM | POA: Diagnosis not present

## 2016-08-26 DIAGNOSIS — G4730 Sleep apnea, unspecified: Secondary | ICD-10-CM | POA: Diagnosis not present

## 2016-08-26 DIAGNOSIS — L89154 Pressure ulcer of sacral region, stage 4: Secondary | ICD-10-CM | POA: Insufficient documentation

## 2016-08-26 DIAGNOSIS — R1110 Vomiting, unspecified: Secondary | ICD-10-CM | POA: Diagnosis not present

## 2016-08-26 DIAGNOSIS — R109 Unspecified abdominal pain: Secondary | ICD-10-CM | POA: Diagnosis not present

## 2016-08-27 ENCOUNTER — Encounter (HOSPITAL_COMMUNITY): Payer: Self-pay | Admitting: Emergency Medicine

## 2016-08-27 ENCOUNTER — Emergency Department (HOSPITAL_COMMUNITY): Payer: Medicare Other

## 2016-08-27 ENCOUNTER — Inpatient Hospital Stay (HOSPITAL_COMMUNITY)
Admission: EM | Admit: 2016-08-27 | Discharge: 2016-09-01 | DRG: 380 | Disposition: A | Discharge status: Home Health | Payer: Medicare Other | Attending: Family Medicine | Admitting: Family Medicine

## 2016-08-27 DIAGNOSIS — K210 Gastro-esophageal reflux disease with esophagitis: Secondary | ICD-10-CM | POA: Diagnosis present

## 2016-08-27 DIAGNOSIS — E876 Hypokalemia: Secondary | ICD-10-CM | POA: Diagnosis present

## 2016-08-27 DIAGNOSIS — Z945 Skin transplant status: Secondary | ICD-10-CM | POA: Diagnosis not present

## 2016-08-27 DIAGNOSIS — L089 Local infection of the skin and subcutaneous tissue, unspecified: Secondary | ICD-10-CM

## 2016-08-27 DIAGNOSIS — Z6824 Body mass index (BMI) 24.0-24.9, adult: Secondary | ICD-10-CM | POA: Diagnosis not present

## 2016-08-27 DIAGNOSIS — Z87440 Personal history of urinary (tract) infections: Secondary | ICD-10-CM

## 2016-08-27 DIAGNOSIS — Z833 Family history of diabetes mellitus: Secondary | ICD-10-CM | POA: Diagnosis not present

## 2016-08-27 DIAGNOSIS — T148XXA Other injury of unspecified body region, initial encounter: Secondary | ICD-10-CM

## 2016-08-27 DIAGNOSIS — K2211 Ulcer of esophagus with bleeding: Secondary | ICD-10-CM | POA: Diagnosis present

## 2016-08-27 DIAGNOSIS — M24451 Recurrent dislocation, right hip: Secondary | ICD-10-CM | POA: Diagnosis present

## 2016-08-27 DIAGNOSIS — Z9359 Other cystostomy status: Secondary | ICD-10-CM | POA: Diagnosis not present

## 2016-08-27 DIAGNOSIS — R0902 Hypoxemia: Secondary | ICD-10-CM | POA: Diagnosis not present

## 2016-08-27 DIAGNOSIS — Z8701 Personal history of pneumonia (recurrent): Secondary | ICD-10-CM

## 2016-08-27 DIAGNOSIS — R112 Nausea with vomiting, unspecified: Secondary | ICD-10-CM | POA: Diagnosis not present

## 2016-08-27 DIAGNOSIS — M869 Osteomyelitis, unspecified: Secondary | ICD-10-CM | POA: Diagnosis present

## 2016-08-27 DIAGNOSIS — K920 Hematemesis: Secondary | ICD-10-CM

## 2016-08-27 DIAGNOSIS — Z933 Colostomy status: Secondary | ICD-10-CM

## 2016-08-27 DIAGNOSIS — I10 Essential (primary) hypertension: Secondary | ICD-10-CM

## 2016-08-27 DIAGNOSIS — J9811 Atelectasis: Secondary | ICD-10-CM | POA: Diagnosis not present

## 2016-08-27 DIAGNOSIS — Z8711 Personal history of peptic ulcer disease: Secondary | ICD-10-CM

## 2016-08-27 DIAGNOSIS — F329 Major depressive disorder, single episode, unspecified: Secondary | ICD-10-CM | POA: Diagnosis present

## 2016-08-27 DIAGNOSIS — Z803 Family history of malignant neoplasm of breast: Secondary | ICD-10-CM | POA: Diagnosis not present

## 2016-08-27 DIAGNOSIS — Z888 Allergy status to other drugs, medicaments and biological substances status: Secondary | ICD-10-CM | POA: Diagnosis not present

## 2016-08-27 DIAGNOSIS — L89154 Pressure ulcer of sacral region, stage 4: Secondary | ICD-10-CM | POA: Diagnosis present

## 2016-08-27 DIAGNOSIS — Z9989 Dependence on other enabling machines and devices: Secondary | ICD-10-CM

## 2016-08-27 DIAGNOSIS — D62 Acute posthemorrhagic anemia: Secondary | ICD-10-CM | POA: Diagnosis present

## 2016-08-27 DIAGNOSIS — Z8719 Personal history of other diseases of the digestive system: Secondary | ICD-10-CM

## 2016-08-27 DIAGNOSIS — G4733 Obstructive sleep apnea (adult) (pediatric): Secondary | ICD-10-CM

## 2016-08-27 DIAGNOSIS — G40909 Epilepsy, unspecified, not intractable, without status epilepticus: Secondary | ICD-10-CM | POA: Diagnosis not present

## 2016-08-27 DIAGNOSIS — Z981 Arthrodesis status: Secondary | ICD-10-CM | POA: Diagnosis not present

## 2016-08-27 DIAGNOSIS — G8250 Quadriplegia, unspecified: Secondary | ICD-10-CM | POA: Diagnosis present

## 2016-08-27 DIAGNOSIS — E669 Obesity, unspecified: Secondary | ICD-10-CM | POA: Diagnosis present

## 2016-08-27 DIAGNOSIS — K922 Gastrointestinal hemorrhage, unspecified: Secondary | ICD-10-CM | POA: Diagnosis not present

## 2016-08-27 DIAGNOSIS — D509 Iron deficiency anemia, unspecified: Secondary | ICD-10-CM | POA: Diagnosis not present

## 2016-08-27 DIAGNOSIS — J9610 Chronic respiratory failure, unspecified whether with hypoxia or hypercapnia: Secondary | ICD-10-CM | POA: Diagnosis present

## 2016-08-27 DIAGNOSIS — D638 Anemia in other chronic diseases classified elsewhere: Secondary | ICD-10-CM | POA: Diagnosis present

## 2016-08-27 DIAGNOSIS — R1110 Vomiting, unspecified: Secondary | ICD-10-CM | POA: Diagnosis not present

## 2016-08-27 DIAGNOSIS — R109 Unspecified abdominal pain: Secondary | ICD-10-CM | POA: Diagnosis not present

## 2016-08-27 DIAGNOSIS — L8992 Pressure ulcer of unspecified site, stage 2: Secondary | ICD-10-CM | POA: Diagnosis not present

## 2016-08-27 DIAGNOSIS — Z881 Allergy status to other antibiotic agents status: Secondary | ICD-10-CM | POA: Diagnosis not present

## 2016-08-27 DIAGNOSIS — K209 Esophagitis, unspecified: Secondary | ICD-10-CM | POA: Diagnosis not present

## 2016-08-27 LAB — HEMOGLOBIN AND HEMATOCRIT, BLOOD
HEMATOCRIT: 22.7 % — AB (ref 39.0–52.0)
HEMOGLOBIN: 7 g/dL — AB (ref 13.0–17.0)

## 2016-08-27 LAB — I-STAT CHEM 8, ED
BUN: 42 mg/dL — ABNORMAL HIGH (ref 6–20)
CHLORIDE: 105 mmol/L (ref 101–111)
Calcium, Ion: 1.14 mmol/L — ABNORMAL LOW (ref 1.15–1.40)
Creatinine, Ser: 0.8 mg/dL (ref 0.61–1.24)
GLUCOSE: 155 mg/dL — AB (ref 65–99)
HEMATOCRIT: 29 % — AB (ref 39.0–52.0)
Hemoglobin: 9.9 g/dL — ABNORMAL LOW (ref 13.0–17.0)
POTASSIUM: 4.8 mmol/L (ref 3.5–5.1)
Sodium: 141 mmol/L (ref 135–145)
TCO2: 27 mmol/L (ref 0–100)

## 2016-08-27 LAB — COMPREHENSIVE METABOLIC PANEL
ALT: 35 U/L (ref 17–63)
ANION GAP: 6 (ref 5–15)
AST: 25 U/L (ref 15–41)
Albumin: 2.9 g/dL — ABNORMAL LOW (ref 3.5–5.0)
Alkaline Phosphatase: 89 U/L (ref 38–126)
BUN: 45 mg/dL — ABNORMAL HIGH (ref 6–20)
CHLORIDE: 107 mmol/L (ref 101–111)
CO2: 27 mmol/L (ref 22–32)
Calcium: 8.5 mg/dL — ABNORMAL LOW (ref 8.9–10.3)
Creatinine, Ser: 0.71 mg/dL (ref 0.61–1.24)
GFR calc non Af Amer: >60 mL/min (ref >60)
Glucose, Bld: 158 mg/dL — ABNORMAL HIGH (ref 65–99)
POTASSIUM: 4.9 mmol/L (ref 3.5–5.1)
SODIUM: 140 mmol/L (ref 135–145)
Total Bilirubin: 0.3 mg/dL (ref 0.3–1.2)
Total Protein: 7.6 g/dL (ref 6.5–8.1)

## 2016-08-27 LAB — CBC WITH DIFFERENTIAL/PLATELET
BASOS ABS: 0 10*3/uL (ref 0.0–0.1)
Basophils Relative: 0 %
EOS ABS: 0 10*3/uL (ref 0.0–0.7)
EOS PCT: 0 %
HCT: 29.3 % — ABNORMAL LOW (ref 39.0–52.0)
Hemoglobin: 9.1 g/dL — ABNORMAL LOW (ref 13.0–17.0)
LYMPHS ABS: 1.6 10*3/uL (ref 0.7–4.0)
Lymphocytes Relative: 10 %
MCH: 25.3 pg — AB (ref 26.0–34.0)
MCHC: 31.1 g/dL (ref 30.0–36.0)
MCV: 81.6 fL (ref 78.0–100.0)
MONO ABS: 1.5 10*3/uL — AB (ref 0.1–1.0)
Monocytes Relative: 9 %
Neutro Abs: 13 10*3/uL — ABNORMAL HIGH (ref 1.7–7.7)
Neutrophils Relative %: 81 %
PLATELETS: 414 10*3/uL — AB (ref 150–400)
RBC: 3.59 MIL/uL — ABNORMAL LOW (ref 4.22–5.81)
RDW: 17.5 % — AB (ref 11.5–15.5)
WBC: 16.1 10*3/uL — AB (ref 4.0–10.5)

## 2016-08-27 LAB — I-STAT CG4 LACTIC ACID, ED: Lactic Acid, Venous: 0.76 mmol/L (ref 0.5–1.9)

## 2016-08-27 LAB — PROTIME-INR
INR: 1.19
Prothrombin Time: 15.2 seconds (ref 11.4–15.2)

## 2016-08-27 LAB — LACTIC ACID, PLASMA: Lactic Acid, Venous: 0.5 mmol/L (ref 0.5–1.9)

## 2016-08-27 LAB — I-STAT TROPONIN, ED: TROPONIN I, POC: 0 ng/mL (ref 0.00–0.08)

## 2016-08-27 LAB — OCCULT BLOOD GASTRIC / DUODENUM (SPECIMEN CUP)
OCCULT BLOOD, GASTRIC: POSITIVE — AB
PH, GASTRIC: 3

## 2016-08-27 MED ORDER — MAGNESIUM OXIDE 400 (241.3 MG) MG PO TABS
400.0000 mg | ORAL_TABLET | Freq: Every day | ORAL | Status: DC
  Administered 2016-08-28 – 2016-09-01 (×5): 400 mg via ORAL
  Filled 2016-08-27 (×5): qty 1

## 2016-08-27 MED ORDER — DEXTROSE 5 % IV SOLN
1.0000 g | Freq: Once | INTRAVENOUS | Status: AC
  Administered 2016-08-27: 1 g via INTRAVENOUS
  Filled 2016-08-27: qty 10

## 2016-08-27 MED ORDER — SODIUM CHLORIDE 0.9 % IV SOLN
Freq: Once | INTRAVENOUS | Status: AC
  Administered 2016-08-28: 10 mL/h via INTRAVENOUS

## 2016-08-27 MED ORDER — METRONIDAZOLE IN NACL 5-0.79 MG/ML-% IV SOLN
500.0000 mg | Freq: Once | INTRAVENOUS | Status: AC
  Administered 2016-08-27: 500 mg via INTRAVENOUS
  Filled 2016-08-27: qty 100

## 2016-08-27 MED ORDER — NOREPINEPHRINE 4 MG/250ML-% IV SOLN
0.0000 ug/min | Freq: Once | INTRAVENOUS | Status: DC
  Filled 2016-08-27: qty 250

## 2016-08-27 MED ORDER — ACETAMINOPHEN 325 MG PO TABS
650.0000 mg | ORAL_TABLET | Freq: Four times a day (QID) | ORAL | Status: DC | PRN
  Administered 2016-08-29: 650 mg via ORAL
  Filled 2016-08-27: qty 2

## 2016-08-27 MED ORDER — JUVEN PO PACK
1.0000 | PACK | Freq: Two times a day (BID) | ORAL | Status: DC
Start: 2016-08-27 — End: 2016-09-01
  Administered 2016-08-31 – 2016-09-01 (×3): 1 via ORAL
  Filled 2016-08-27 (×11): qty 1

## 2016-08-27 MED ORDER — IOPAMIDOL (ISOVUE-300) INJECTION 61%
INTRAVENOUS | Status: AC
  Filled 2016-08-27: qty 100

## 2016-08-27 MED ORDER — PANTOPRAZOLE SODIUM 40 MG IV SOLR
80.0000 mg | Freq: Once | INTRAVENOUS | Status: AC
  Administered 2016-08-27: 80 mg via INTRAVENOUS
  Filled 2016-08-27: qty 80

## 2016-08-27 MED ORDER — PANTOPRAZOLE SODIUM 40 MG IV SOLR: 40.0000 mg | Freq: Two times a day (BID) | INTRAVENOUS | Status: DC

## 2016-08-27 MED ORDER — ACETAMINOPHEN 650 MG RE SUPP: 650.0000 mg | Freq: Four times a day (QID) | RECTAL | Status: DC | PRN

## 2016-08-27 MED ORDER — SODIUM CHLORIDE 0.9 % IV BOLUS (SEPSIS)
1000.0000 mL | Freq: Once | INTRAVENOUS | Status: AC
  Administered 2016-08-27: 1000 mL via INTRAVENOUS

## 2016-08-27 MED ORDER — BACLOFEN 20 MG PO TABS
20.0000 mg | ORAL_TABLET | Freq: Four times a day (QID) | ORAL | Status: DC
  Administered 2016-08-27 – 2016-09-01 (×22): 20 mg via ORAL
  Filled 2016-08-27 (×19): qty 1
  Filled 2016-08-27: qty 2
  Filled 2016-08-27 (×2): qty 1

## 2016-08-27 MED ORDER — DEXTROSE 5 % IV SOLN
2.0000 g | INTRAVENOUS | Status: DC
  Administered 2016-08-28 – 2016-09-01 (×5): 2 g via INTRAVENOUS
  Filled 2016-08-27 (×5): qty 2

## 2016-08-27 MED ORDER — DARIFENACIN HYDROBROMIDE ER 7.5 MG PO TB24
7.5000 mg | ORAL_TABLET | Freq: Every day | ORAL | Status: DC
  Administered 2016-08-28 – 2016-09-01 (×5): 7.5 mg via ORAL
  Filled 2016-08-27 (×6): qty 1

## 2016-08-27 MED ORDER — SODIUM CHLORIDE 0.9 % IV SOLN
INTRAVENOUS | Status: DC
  Administered 2016-08-28 – 2016-08-29 (×3): via INTRAVENOUS

## 2016-08-27 MED ORDER — ACETAMINOPHEN 325 MG PO TABS: 650.0000 mg | ORAL_TABLET | Freq: Four times a day (QID) | ORAL | Status: DC | PRN

## 2016-08-27 MED ORDER — SODIUM CHLORIDE 0.9 % IV SOLN
INTRAVENOUS | Status: DC
  Administered 2016-08-27: 17:00:00 via INTRAVENOUS
  Administered 2016-08-27: 100 mL/h via INTRAVENOUS
  Administered 2016-08-29 – 2016-09-01 (×6): via INTRAVENOUS

## 2016-08-27 MED ORDER — SODIUM CHLORIDE 0.9% FLUSH
10.0000 mL | INTRAVENOUS | Status: DC | PRN
  Administered 2016-08-28 – 2016-08-30 (×2): 10 mL
  Filled 2016-08-27 (×2): qty 40

## 2016-08-27 MED ORDER — OXYCODONE HCL 5 MG PO TABS: 5.0000 mg | ORAL_TABLET | ORAL | Status: DC | PRN

## 2016-08-27 MED ORDER — VITAMIN C 500 MG PO TABS
500.0000 mg | ORAL_TABLET | Freq: Every morning | ORAL | Status: DC
  Administered 2016-08-29 – 2016-09-01 (×4): 500 mg via ORAL
  Filled 2016-08-27 (×5): qty 1

## 2016-08-27 MED ORDER — METRONIDAZOLE IN NACL 5-0.79 MG/ML-% IV SOLN
500.0000 mg | Freq: Three times a day (TID) | INTRAVENOUS | Status: DC
  Administered 2016-08-27 – 2016-09-01 (×15): 500 mg via INTRAVENOUS
  Filled 2016-08-27 (×17): qty 100

## 2016-08-27 MED ORDER — SUCRALFATE 1 G PO TABS
1.0000 g | ORAL_TABLET | Freq: Three times a day (TID) | ORAL | Status: DC
  Administered 2016-08-27 – 2016-09-01 (×21): 1 g via ORAL
  Filled 2016-08-27 (×21): qty 1

## 2016-08-27 MED ORDER — ZINC SULFATE 220 (50 ZN) MG PO CAPS
220.0000 mg | ORAL_CAPSULE | Freq: Two times a day (BID) | ORAL | Status: DC
  Administered 2016-08-28 – 2016-09-01 (×9): 220 mg via ORAL
  Filled 2016-08-27 (×10): qty 1

## 2016-08-27 MED ORDER — FERROUS SULFATE 325 (65 FE) MG PO TABS
325.0000 mg | ORAL_TABLET | Freq: Three times a day (TID) | ORAL | Status: DC
  Administered 2016-08-28 – 2016-09-01 (×14): 325 mg via ORAL
  Filled 2016-08-27 (×15): qty 1

## 2016-08-27 MED ORDER — ZINC 50 MG PO TABS: 50.0000 mg | ORAL_TABLET | Freq: Two times a day (BID) | ORAL | Status: DC

## 2016-08-27 MED ORDER — IOPAMIDOL (ISOVUE-300) INJECTION 61%
100.0000 mL | Freq: Once | INTRAVENOUS | Status: AC | PRN
  Administered 2016-08-27: 100 mL via INTRAVENOUS

## 2016-08-27 MED ORDER — METOCLOPRAMIDE HCL 5 MG/ML IJ SOLN
10.0000 mg | Freq: Four times a day (QID) | INTRAMUSCULAR | Status: DC
  Administered 2016-08-27 – 2016-09-01 (×21): 10 mg via INTRAVENOUS
  Filled 2016-08-27 (×21): qty 2

## 2016-08-27 MED ORDER — ONDANSETRON HCL 4 MG/2ML IJ SOLN
4.0000 mg | Freq: Once | INTRAMUSCULAR | Status: AC
  Administered 2016-08-27: 4 mg via INTRAVENOUS
  Filled 2016-08-27: qty 2

## 2016-08-27 MED ORDER — ONDANSETRON HCL 4 MG PO TABS: 4.0000 mg | ORAL_TABLET | Freq: Four times a day (QID) | ORAL | Status: DC | PRN

## 2016-08-27 MED ORDER — ONDANSETRON HCL 4 MG/2ML IJ SOLN
4.0000 mg | Freq: Four times a day (QID) | INTRAMUSCULAR | Status: DC | PRN
  Administered 2016-08-27: 4 mg via INTRAVENOUS
  Filled 2016-08-27: qty 2

## 2016-08-27 MED ORDER — SODIUM CHLORIDE 0.9 % IV SOLN
8.0000 mg/h | INTRAVENOUS | Status: AC
  Administered 2016-08-27 – 2016-08-30 (×5): 8 mg/h via INTRAVENOUS
  Filled 2016-08-27 (×12): qty 80

## 2016-08-27 MED ORDER — DOCUSATE SODIUM 100 MG PO CAPS
200.0000 mg | ORAL_CAPSULE | Freq: Two times a day (BID) | ORAL | Status: DC
  Administered 2016-08-28 – 2016-09-01 (×10): 200 mg via ORAL
  Filled 2016-08-27 (×10): qty 2

## 2016-08-27 NOTE — Progress Notes (Signed)
BP 88/43, HR 86 after NS bolus. Dr Tyrell Antonio aware of this and that emesis + occult blood. Await lab results. New order for repeat 1L NS bolus.  Will administer and monitor for effect.

## 2016-08-27 NOTE — ED Triage Notes (Signed)
N/V for 1 day that became worse through the night.

## 2016-08-27 NOTE — Progress Notes (Signed)
Third bolus for the day has finished and ;BP remains the same 88/46.  LA has decreased to 0.5.  Night MD, Hal Hope, notified as per orders.  Pt without changes
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
## 2016-08-27 NOTE — Progress Notes (Signed)
Dr Tyrell Antonio made aware of SIRS/Sepsis flag on pt chart.  BP 88/40 HR 111. New orders noted. Spoke w/ Dr Tyrell Antonio directly regarding POC.  Pt updated.
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
## 2016-08-27 NOTE — ED Notes (Signed)
EDP @ bedside 

## 2016-08-27 NOTE — ED Notes (Signed)
RN contacted pharmacy to verify compatibility of Protonix and Rocephin.
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
## 2016-08-27 NOTE — ED Notes (Signed)
Patient transported to CT 

## 2016-08-27 NOTE — ED Notes (Signed)
Bed: WA05 Expected date:  Expected time:  Means of arrival:  Comments: 

## 2016-08-27 NOTE — ED Notes (Signed)
Bed: WA04 Expected date: 08/27/16 Expected time: 9:53 AM Means of arrival: Ambulance Comments: Abd pain N/V

## 2016-08-27 NOTE — ED Notes (Signed)
RN contacted pharmacy to verify additional dose of Rocephin

## 2016-08-27 NOTE — ED Triage Notes (Signed)
Pt took a 4mg  Zofran PO at about 0300 this morning

## 2016-08-27 NOTE — Progress Notes (Signed)
Pharmacy Antibiotic Note  [name] [name] is a 51 y.o. quadraplegic male with sacral decubiti and hx chronic osteomyelitis of the pelvis (s/p bone biopsy of rib and bone/soft tissue debridement on 05/19/16, then d/ced home with 6 weeks of vanc and zosyn for positive ecoli, P mirabilis and PsA in bone cultures. Home vancomycin was d/ced on 06/14/16 d/t worsening of renal function).  He presented to ED on 08/27/16 with c/o n/v.  To start ceftriaxone for wound infection/osteomyelitis  - 4/14 CXR: no PNA; right fifth rib mass - 4/14 abd/pelvis CT: chronic hip dislocation and osteomyelitis; right sacral decubitus wound with gas tracking into the dislocated  right hip joint-- cannot excluded active infection - afeb, wbc 16.1, scr 0.80-- quadraplegic (crcl~100)   Plan: - ceftriaxone 2 gm IV q24h - pharmacy will sign off since no renal adjustment is needed with cefriaxone - consider consulting ID for further recommendations since pt was recently followed by [name] outpatient for sacral decubitus ulcers.   ____________________________________   Height: 6\' 3"  (190.5 cm) IBW/kg (Calculated) : 84.5  Temp (24hrs), Avg:98.6 F (37 C), Min:98.6 F (37 C), Max:98.6 F (37 C)   Recent Labs Lab 08/27/16 1025 08/27/16 1047  WBC 16.1*  --   CREATININE 0.71 0.80  LATICACIDVEN  --  0.76    CrCl cannot be calculated (Unknown ideal weight.).    Allergies  Allergen Reactions  . Feraheme [Ferumoxytol] Other (See Comments)    SYNCOPE  . Ditropan [Oxybutynin] Other (See Comments)    Hallucinations   . Vancomycin Other (See Comments)    ARF 05-2016     Thank you for allowing pharmacy to be a part of this patient's care.  Dia Sitter P 08/27/2016 1:32 PM

## 2016-08-28 DIAGNOSIS — K920 Hematemesis: Secondary | ICD-10-CM

## 2016-08-28 LAB — CBC
HEMATOCRIT: 27 % — AB (ref 39.0–52.0)
HEMOGLOBIN: 8.3 g/dL — AB (ref 13.0–17.0)
MCH: 24.9 pg — ABNORMAL LOW (ref 26.0–34.0)
MCHC: 30.7 g/dL (ref 30.0–36.0)
MCV: 81.1 fL (ref 78.0–100.0)
Platelets: 335 10*3/uL (ref 150–400)
RBC: 3.33 MIL/uL — ABNORMAL LOW (ref 4.22–5.81)
RDW: 16.8 % — AB (ref 11.5–15.5)
WBC: 9.7 10*3/uL (ref 4.0–10.5)

## 2016-08-28 LAB — BASIC METABOLIC PANEL
Anion gap: 7 (ref 5–15)
BUN: 27 mg/dL — ABNORMAL HIGH (ref 6–20)
CALCIUM: 8.4 mg/dL — AB (ref 8.9–10.3)
CHLORIDE: 111 mmol/L (ref 101–111)
CO2: 24 mmol/L (ref 22–32)
CREATININE: 0.44 mg/dL — AB (ref 0.61–1.24)
GFR calc non Af Amer: >60 mL/min (ref >60)
Glucose, Bld: 96 mg/dL (ref 65–99)
Potassium: 3.9 mmol/L (ref 3.5–5.1)
Sodium: 142 mmol/L (ref 135–145)

## 2016-08-28 LAB — HEMOGLOBIN AND HEMATOCRIT, BLOOD
HEMATOCRIT: 24.9 % — AB (ref 39.0–52.0)
HEMOGLOBIN: 7.8 g/dL — AB (ref 13.0–17.0)

## 2016-08-28 LAB — PREPARE RBC (CROSSMATCH)

## 2016-08-28 LAB — LACTIC ACID, PLASMA: LACTIC ACID, VENOUS: 0.7 mmol/L (ref 0.5–1.9)

## 2016-08-28 MED ORDER — ALUM & MAG HYDROXIDE-SIMETH 200-200-20 MG/5ML PO SUSP
15.0000 mL | ORAL | Status: DC | PRN
  Administered 2016-08-28: 15 mL via ORAL
  Filled 2016-08-28: qty 30

## 2016-08-28 MED ORDER — HYDROCERIN EX CREA
TOPICAL_CREAM | Freq: Every day | CUTANEOUS | Status: DC
  Administered 2016-08-28 – 2016-09-01 (×5): via TOPICAL
  Filled 2016-08-28 (×2): qty 113
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
## 2016-08-28 NOTE — Consult Note (Signed)
[place] Nurse wound consult note: Patient well known to the [facility] team from previous admissions. Reason for Consult: Wounds, chronic non-healing Wound type:Pressure, shear, friction, moisture Pressure Injury POA: Yes Measurement: Sacral/Buttocks/Bilateral ischial tuberosity: 20cm x 26cm x 0.2cm over 80% of wound bed.  Two areas of depth, right ischial area measures 2.5cm x 3cm x 3cm and bone is palpable in this wound.  Central buttock wound with sickle shaped area measuring 0.4cm x 3.5cm x 3cm. Right shoulder wound measures 5cm x 8cm with 0.5cm depth. Scattered areas on the bilateral LEs are noted and are full thickness.  The largest of these is located on the left medial calf and measures 3cm x 1cm x 0.2cm. There are healed partial thickness areas of moisture associated skin damage from dressings and drainage and protective barrier cream will be used on the penis, scrotum and aeras of intact inguinal skin.  There is an area of intertriginous dermatitis on the right subpannicular area and this is treated with petrolatum gauze. Wound bed: Red, moist Drainage (amount, consistency, odor) moderate amounts of serous drainage, one small area with serosanguinous drainage where gauze adheres upon removal and creates tissue trauma. Odor is improved since admission with two dressing changes. Periwound:Intact with evidence of previous wound healing and contraction (scar formation) Dressing procedure/placement/frequency: I have implemented the POC used at both home and here during previous admission as wounds are stable and smaller than previously noted.  These interventions include: Twice daily saline moistened filling and covering of wounds on the buttocks, sacrum, ischial tuberosities with saline moistened roll gauze, covering that with dry gauze 4x4s, topping dressings with ABD pads and securing with tape.   Daily application of Eucerin cream to the bilateral LEs and feet, then once daily wound care to full  thickness wounds using silver hydrofiber and topping with silicone foam dressings. Right shoulder wound (seen in follow up by Plastics MD, C. Dillingham three weeks ago on 08/02/16) fill with silver hydrofiber, top with dry gauze and top with silicone foam, change daily. I will order/provide a mattress replacement with low air loss feature.  Patient with edema in bilateral feet, but declines Pressure redistribution boots in favor of elevation.   [facility] Nurse ostomy consult note Stoma type/location: LLQ colostomy, pouch applied yesterday upon admission is intact. Stomal assessment/size: 1 and 1/2 inches, round, red, moist Peristomal assessment: Not seen today Treatment options for stomal/peristomal skin: none Output: brown stool, soft Ostomy pouching: 2pc. 2 and 3/4 inch pouching system. Pouching supplies at bedside. Education provided: None.  Patient is not independent in his ostomy care, he is comfortable with current supplier.  It is noteworthy that patient is the most upbeat and conversive that he has been in a very long time; he reports pleasure at wound status and with follow up and care provided by [name].  [facility] team will not follow, but will remain available to this patient, the nursing and medical teams.  Please re-consult if needed. Thanks, Maudie Flakes, MSN, RN, [place], [facility]  Pager# [phone]

## 2016-08-28 NOTE — Plan of Care (Signed)
Problem: Pain Managment: Goal: General experience of comfort will improve Outcome: Progressing Pt denies pain. Reports no vomiting overnight and nausea much improved.   Problem: Physical Regulation: Goal: Will remain free from infection Outcome: Progressing On IV abx.   Problem: Skin Integrity: Goal: Risk for impaired skin integrity will decrease Outcome: Progressing Await WOC consult. T&P q 2hr.   Problem: Activity: Goal: Risk for activity intolerance will decrease Outcome: Not Applicable Date Met: 85/99/23 Pt is paraplegic, bed bound at baseline.

## 2016-08-28 NOTE — Progress Notes (Signed)
Dr Orma Render paged to make aware of slight drift in hgb from 8.3 to 7.9.  Pt VSS. No emesis since yesterday evening.

## 2016-08-29 DIAGNOSIS — D509 Iron deficiency anemia, unspecified: Secondary | ICD-10-CM

## 2016-08-29 DIAGNOSIS — L89154 Pressure ulcer of sacral region, stage 4: Secondary | ICD-10-CM

## 2016-08-29 DIAGNOSIS — G40909 Epilepsy, unspecified, not intractable, without status epilepticus: Secondary | ICD-10-CM

## 2016-08-29 DIAGNOSIS — Z933 Colostomy status: Secondary | ICD-10-CM

## 2016-08-29 DIAGNOSIS — Z9359 Other cystostomy status: Secondary | ICD-10-CM

## 2016-08-29 LAB — BASIC METABOLIC PANEL
ANION GAP: 4 — AB (ref 5–15)
BUN: 12 mg/dL (ref 6–20)
CALCIUM: 8.2 mg/dL — AB (ref 8.9–10.3)
CO2: 24 mmol/L (ref 22–32)
Chloride: 113 mmol/L — ABNORMAL HIGH (ref 101–111)
Creatinine, Ser: 0.35 mg/dL — ABNORMAL LOW (ref 0.61–1.24)
GFR calc Af Amer: >60 mL/min (ref >60)
GLUCOSE: 89 mg/dL (ref 65–99)
Potassium: 3.6 mmol/L (ref 3.5–5.1)
Sodium: 141 mmol/L (ref 135–145)

## 2016-08-29 LAB — CBC
HCT: 23.8 % — ABNORMAL LOW (ref 39.0–52.0)
Hemoglobin: 7.3 g/dL — ABNORMAL LOW (ref 13.0–17.0)
MCH: 25.1 pg — ABNORMAL LOW (ref 26.0–34.0)
MCHC: 30.7 g/dL (ref 30.0–36.0)
MCV: 81.8 fL (ref 78.0–100.0)
PLATELETS: 291 10*3/uL (ref 150–400)
RBC: 2.91 MIL/uL — ABNORMAL LOW (ref 4.22–5.81)
RDW: 17.1 % — AB (ref 11.5–15.5)
WBC: 7.4 10*3/uL (ref 4.0–10.5)

## 2016-08-29 LAB — PREPARE RBC (CROSSMATCH)

## 2016-08-29 MED ORDER — SODIUM CHLORIDE 0.9 % IV SOLN: Freq: Once | INTRAVENOUS | Status: AC
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
## 2016-08-29 NOTE — Care Management Note (Signed)
Case Management Note  Patient Details  Name: [name] [name] MRN: [id number] Date of Birth: 11/13/1965  Subjective/Objective:51 y/o m admitted w/Sacral wound. Hx: paraplegia.From home. Active w/[facility] HHRN-rep [place] already following,await HHRN resumption orders. Will need ambulance transp @ d/c.                     Action/Plan:d/c plan home w/HHC/ambulance transp.   Expected Discharge Date:                  Expected Discharge Plan:  [place]  In-House Referral:     Discharge planning Services  CM Consult  Post Acute Care Choice:  Home Health (Active w/[facility]-HHRN) Choice offered to:     DME Arranged:    DME Agency:     HH Arranged:    HH Agency:  [facility]  Status of Service:  In process, will continue to follow  If discussed at Long Length of Stay Meetings, dates discussed:    Additional Comments:  Dessa Phi, RN 08/29/2016, 10:51 AM
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
## 2016-08-29 NOTE — Progress Notes (Signed)
Protonix gtt stopped while pt receiving blood transfusions. Pt has port, unable to run blood and protonix gtt concurrently. Pt is known to IV team, they have assessed him for peripheral access and were unsuccessful.  Pt reports prior to port placement he had to have a PICC with each admission d/t poor vascular access.  Drs Alfredia Ferguson and Michail Sermon aware of above and agreeable.
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
## 2016-08-29 NOTE — Plan of Care (Signed)
Problem: Physical Regulation: Goal: Ability to maintain clinical measurements within normal limits will improve Hgb dropped to 7.3 from 8.3. 2u PRBCs today.

## 2016-08-30 DIAGNOSIS — R112 Nausea with vomiting, unspecified: Secondary | ICD-10-CM

## 2016-08-30 DIAGNOSIS — M869 Osteomyelitis, unspecified: Secondary | ICD-10-CM

## 2016-08-30 LAB — BLOOD CULTURE ID PANEL (REFLEXED)
Acinetobacter baumannii: NOT DETECTED
CANDIDA ALBICANS: NOT DETECTED
CANDIDA GLABRATA: NOT DETECTED
CANDIDA KRUSEI: NOT DETECTED
CANDIDA TROPICALIS: NOT DETECTED
Candida parapsilosis: NOT DETECTED
ENTEROBACTER CLOACAE COMPLEX: NOT DETECTED
ENTEROBACTERIACEAE SPECIES: NOT DETECTED
Enterococcus species: NOT DETECTED
Escherichia coli: NOT DETECTED
Haemophilus influenzae: NOT DETECTED
KLEBSIELLA OXYTOCA: NOT DETECTED
KLEBSIELLA PNEUMONIAE: NOT DETECTED
Listeria monocytogenes: NOT DETECTED
Methicillin resistance: DETECTED — AB
NEISSERIA MENINGITIDIS: NOT DETECTED
Proteus species: NOT DETECTED
Pseudomonas aeruginosa: NOT DETECTED
STAPHYLOCOCCUS SPECIES: DETECTED — AB
STREPTOCOCCUS PYOGENES: NOT DETECTED
STREPTOCOCCUS SPECIES: NOT DETECTED
Serratia marcescens: NOT DETECTED
Staphylococcus aureus (BCID): NOT DETECTED
Streptococcus agalactiae: NOT DETECTED
Streptococcus pneumoniae: NOT DETECTED

## 2016-08-30 LAB — COMPREHENSIVE METABOLIC PANEL
ALT: 37 U/L (ref 17–63)
AST: 30 U/L (ref 15–41)
Albumin: 2.6 g/dL — ABNORMAL LOW (ref 3.5–5.0)
Alkaline Phosphatase: 98 U/L (ref 38–126)
Anion gap: 6 (ref 5–15)
BUN: 9 mg/dL (ref 6–20)
CHLORIDE: 112 mmol/L — AB (ref 101–111)
CO2: 24 mmol/L (ref 22–32)
CREATININE: 0.43 mg/dL — AB (ref 0.61–1.24)
Calcium: 8.3 mg/dL — ABNORMAL LOW (ref 8.9–10.3)
Glucose, Bld: 104 mg/dL — ABNORMAL HIGH (ref 65–99)
Potassium: 3 mmol/L — ABNORMAL LOW (ref 3.5–5.1)
Sodium: 142 mmol/L (ref 135–145)
Total Bilirubin: 0.4 mg/dL (ref 0.3–1.2)
Total Protein: 6.8 g/dL (ref 6.5–8.1)

## 2016-08-30 LAB — CBC WITH DIFFERENTIAL/PLATELET
Basophils Absolute: 0 10*3/uL (ref 0.0–0.1)
Basophils Relative: 0 %
EOS PCT: 7 %
Eosinophils Absolute: 0.6 10*3/uL (ref 0.0–0.7)
HCT: 32.2 % — ABNORMAL LOW (ref 39.0–52.0)
Hemoglobin: 10.3 g/dL — ABNORMAL LOW (ref 13.0–17.0)
LYMPHS ABS: 1.6 10*3/uL (ref 0.7–4.0)
LYMPHS PCT: 19 %
MCH: 26.7 pg (ref 26.0–34.0)
MCHC: 32 g/dL (ref 30.0–36.0)
MCV: 83.4 fL (ref 78.0–100.0)
MONO ABS: 0.9 10*3/uL (ref 0.1–1.0)
Monocytes Relative: 11 %
Neutro Abs: 5.3 10*3/uL (ref 1.7–7.7)
Neutrophils Relative %: 63 %
PLATELETS: 308 10*3/uL (ref 150–400)
RBC: 3.86 MIL/uL — ABNORMAL LOW (ref 4.22–5.81)
RDW: 16.4 % — AB (ref 11.5–15.5)
WBC: 8.4 10*3/uL (ref 4.0–10.5)

## 2016-08-30 LAB — HEMOGLOBIN AND HEMATOCRIT, BLOOD
HEMATOCRIT: 30 % — AB (ref 39.0–52.0)
Hemoglobin: 9.7 g/dL — ABNORMAL LOW (ref 13.0–17.0)

## 2016-08-30 LAB — MAGNESIUM: MAGNESIUM: 1.7 mg/dL (ref 1.7–2.4)

## 2016-08-30 LAB — PHOSPHORUS: PHOSPHORUS: 3 mg/dL (ref 2.5–4.6)

## 2016-08-30 MED ORDER — PANTOPRAZOLE SODIUM 40 MG IV SOLR
40.0000 mg | Freq: Two times a day (BID) | INTRAVENOUS | Status: DC
  Administered 2016-08-30 – 2016-09-01 (×4): 40 mg via INTRAVENOUS
  Filled 2016-08-30 (×4): qty 40

## 2016-08-30 MED ORDER — POTASSIUM CHLORIDE CRYS ER 20 MEQ PO TBCR
40.0000 meq | EXTENDED_RELEASE_TABLET | Freq: Two times a day (BID) | ORAL | Status: DC
  Administered 2016-08-30 – 2016-09-01 (×5): 40 meq via ORAL
  Filled 2016-08-30 (×5): qty 2

## 2016-08-30 NOTE — Care Management Note (Signed)
Case Management Note  Patient Details  Name: [name] [name] MRN: [id number] Date of Birth: [birth year]  Subjective/Objective: Spoke to patient about d/c plan- he understands the wound management currently @ home is getting harder to manage-his sister is unable to assist since she has medical issues, he has contacted social services for additional custodial level aide support, he plans to re think a higher level after this admission if the home environment does not provide the level of care needed-he wants to d/c back home w/HHC-[facility] rep Adacia notified, & agreed to accept back-recc HHRN/social worker,aide.Will need ambulance transp @ d/c.                   Action/Plan:d/c plan home w/HHC/ambulance transp   Expected Discharge Date:                  Expected Discharge Plan:  [place]  In-House Referral:     Discharge planning Services  CM Consult  Post Acute Care Choice:  [facility] (Active w/[facility]-HHRN) Choice offered to:     DME Arranged:    DME Agency:     HH Arranged:  RN, Nurse's Aide, Social Work [employer] Agency:  [facility]  Status of Service:  In process, will continue to follow  If discussed at Long Length of Stay Meetings, dates discussed:    Additional Comments:  Dessa Phi, RN 08/30/2016, 12:02 PM

## 2016-08-30 NOTE — Progress Notes (Signed)
PHARMACY - PHYSICIAN COMMUNICATION CRITICAL VALUE ALERT - BLOOD CULTURE IDENTIFICATION (BCID)  Results for orders placed or performed during the hospital encounter of 08/27/16  Blood Culture ID Panel (Reflexed) (Collected: 08/29/2016  6:31 PM)  Result Value Ref Range   Enterococcus species NOT DETECTED NOT DETECTED   Listeria monocytogenes NOT DETECTED NOT DETECTED   Staphylococcus species DETECTED (A) NOT DETECTED   Staphylococcus aureus NOT DETECTED NOT DETECTED   Methicillin resistance DETECTED (A) NOT DETECTED   Streptococcus species NOT DETECTED NOT DETECTED   Streptococcus agalactiae NOT DETECTED NOT DETECTED   Streptococcus pneumoniae NOT DETECTED NOT DETECTED   Streptococcus pyogenes NOT DETECTED NOT DETECTED   Acinetobacter baumannii NOT DETECTED NOT DETECTED   Enterobacteriaceae species NOT DETECTED NOT DETECTED   Enterobacter cloacae complex NOT DETECTED NOT DETECTED   Escherichia coli NOT DETECTED NOT DETECTED   Klebsiella oxytoca NOT DETECTED NOT DETECTED   Klebsiella pneumoniae NOT DETECTED NOT DETECTED   Proteus species NOT DETECTED NOT DETECTED   Serratia marcescens NOT DETECTED NOT DETECTED   Haemophilus influenzae NOT DETECTED NOT DETECTED   Neisseria meningitidis NOT DETECTED NOT DETECTED   Pseudomonas aeruginosa NOT DETECTED NOT DETECTED   Candida albicans NOT DETECTED NOT DETECTED   Candida glabrata NOT DETECTED NOT DETECTED   Candida krusei NOT DETECTED NOT DETECTED   Candida parapsilosis NOT DETECTED NOT DETECTED   Candida tropicalis NOT DETECTED NOT DETECTED    Name of physician (or Provider) Contacted: Sheikh  Changes to prescribed antibiotics required: none needed, to repeat BC now  Eudelia Bunch, Pharm.D. [phone] 08/30/2016 2:58 PM

## 2016-08-30 NOTE — Progress Notes (Signed)
RT placed patient on CPAP. Sterile water added to water chamber for humidification. Patient setting is 5-20 cmH2O. Patient is tolerating well

## 2016-08-30 NOTE — Progress Notes (Signed)
[facility] Progress Note  [name] [name] 51 y.o. 1965/09/25   Subjective: Feels good. Eating ok. Denies abdominal pain/N/V.  Objective: Vital signs in last 24 hours: Vitals:   08/30/16 0437 08/30/16 1247  BP: 127/73 125/84  Pulse: 71 65  Resp: 18 19  Temp:  98.7 F (37.1 C)    Physical Exam: Gen: quadriplegia, contractures, lethargic, no acute distress CV: RRR Chest: CTA anteriorly Abd: soft, nontender, nondistended, ostomy intact (dark green stool in bag)  Lab Results:  Recent Labs  08/29/16 0500 08/30/16 0520  NA 141 142  K 3.6 3.0*  CL 113* 112*  CO2 24 24  GLUCOSE 89 104*  BUN 12 9  CREATININE 0.35* 0.43*  CALCIUM 8.2* 8.3*  MG  --  1.7  PHOS  --  3.0    Recent Labs  08/30/16 0520  AST 30  ALT 37  ALKPHOS 98  BILITOT 0.4  PROT 6.8  ALBUMIN 2.6*    Recent Labs  08/29/16 0500 08/30/16 0520  WBC 7.4 8.4  NEUTROABS  --  5.3  HGB 7.3* 10.3*  HCT 23.8* 32.2*  MCV 81.8 83.4  PLT 291 308   No results for input(s): LABPROT, INR in the last 72 hours.    Assessment/Plan: Coffee grounds emesis likely due to history of erosive esophagitis. Hgb 10.3 following 2 U PRBCs. If tolerating diet ok to go home tomorrow from GI standpoint. Continue PPI PO BID when discharged. Will sign off. Call if questions. F/U with GI prn.   Lasker C. 08/30/2016, 3:55 PM   Pager [phone]  AFTER 5 pm or on weekends call [phone] ID: [name] [name], male   DOB: Dec 26, 1965, 51 y.o.   MRN: [id number]
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
## 2016-08-31 ENCOUNTER — Telehealth: Payer: Self-pay | Admitting: Hematology

## 2016-08-31 LAB — CBC WITH DIFFERENTIAL/PLATELET
BASOS ABS: 0 10*3/uL (ref 0.0–0.1)
BASOS PCT: 0 %
Eosinophils Absolute: 0.8 10*3/uL — ABNORMAL HIGH (ref 0.0–0.7)
Eosinophils Relative: 6 %
HEMATOCRIT: 29.9 % — AB (ref 39.0–52.0)
Hemoglobin: 9.6 g/dL — ABNORMAL LOW (ref 13.0–17.0)
LYMPHS ABS: 1.9 10*3/uL (ref 0.7–4.0)
LYMPHS PCT: 15 %
MCH: 26.8 pg (ref 26.0–34.0)
MCHC: 32.1 g/dL (ref 30.0–36.0)
MCV: 83.5 fL (ref 78.0–100.0)
MONO ABS: 1.2 10*3/uL — AB (ref 0.1–1.0)
Monocytes Relative: 10 %
NEUTROS PCT: 69 %
Neutro Abs: 8.3 10*3/uL — ABNORMAL HIGH (ref 1.7–7.7)
PLATELETS: 298 10*3/uL (ref 150–400)
RBC: 3.58 MIL/uL — AB (ref 4.22–5.81)
RDW: 16.5 % — AB (ref 11.5–15.5)
WBC: 12.3 10*3/uL — AB (ref 4.0–10.5)

## 2016-08-31 LAB — TYPE AND SCREEN
ABO/RH(D): B POS
Antibody Screen: NEGATIVE
UNIT DIVISION: 0
Unit division: 0
Unit division: 0

## 2016-08-31 LAB — COMPREHENSIVE METABOLIC PANEL
ALBUMIN: 2.4 g/dL — AB (ref 3.5–5.0)
ALT: 29 U/L (ref 17–63)
AST: 21 U/L (ref 15–41)
Alkaline Phosphatase: 97 U/L (ref 38–126)
Anion gap: 4 — ABNORMAL LOW (ref 5–15)
BILIRUBIN TOTAL: 0.3 mg/dL (ref 0.3–1.2)
BUN: 11 mg/dL (ref 6–20)
CO2: 25 mmol/L (ref 22–32)
Calcium: 8 mg/dL — ABNORMAL LOW (ref 8.9–10.3)
Chloride: 112 mmol/L — ABNORMAL HIGH (ref 101–111)
Creatinine, Ser: 0.4 mg/dL — ABNORMAL LOW (ref 0.61–1.24)
GFR calc Af Amer: >60 mL/min (ref >60)
GFR calc non Af Amer: >60 mL/min (ref >60)
Glucose, Bld: 112 mg/dL — ABNORMAL HIGH (ref 65–99)
POTASSIUM: 3.3 mmol/L — AB (ref 3.5–5.1)
Sodium: 141 mmol/L (ref 135–145)
Total Protein: 6.2 g/dL — ABNORMAL LOW (ref 6.5–8.1)

## 2016-08-31 LAB — BPAM RBC
Blood Product Expiration Date: 201804202359
Blood Product Expiration Date: 201805082359
Blood Product Expiration Date: 201805102359
ISSUE DATE / TIME: 201804161845
ISSUE DATE / TIME: 201804150005
ISSUE DATE / TIME: 201804161454
Unit Type and Rh: 1700
Unit Type and Rh: 7300
Unit Type and Rh: 7300

## 2016-08-31 LAB — PHOSPHORUS: Phosphorus: 2.4 mg/dL — ABNORMAL LOW (ref 2.5–4.6)

## 2016-08-31 LAB — MAGNESIUM: Magnesium: 1.6 mg/dL — ABNORMAL LOW (ref 1.7–2.4)

## 2016-08-31 MED ORDER — POTASSIUM PHOSPHATES 15 MMOLE/5ML IV SOLN
40.0000 meq | Freq: Once | INTRAVENOUS | Status: AC
  Administered 2016-08-31: 40 meq via INTRAVENOUS
  Filled 2016-08-31: qty 9.09

## 2016-08-31 MED ORDER — MAGNESIUM SULFATE 2 GM/50ML IV SOLN
2.0000 g | Freq: Once | INTRAVENOUS | Status: AC
  Administered 2016-08-31: 2 g via INTRAVENOUS
  Filled 2016-08-31: qty 50

## 2016-08-31 NOTE — Telephone Encounter (Signed)
Patient called and cancelled appointments due to being in the hospital

## 2016-08-31 NOTE — Care Management Important Message (Signed)
Important Message  Patient Details IM Letter given to [name]/Case Manager to present to Patient Name: [name] [name] MRN: [id number] Date of Birth: 06-09-1965   Medicare Important Message Given:  Yes    Kerin Salen 08/31/2016, 10:33 AM
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
## 2016-08-31 NOTE — Progress Notes (Signed)
RT placed patient on CPAP. Sterile water was added to water chamber for humidification. Patient is on auto 5-20 cmH2O. Patient is tolerating well.

## 2016-09-01 ENCOUNTER — Ambulatory Visit: Payer: Medicare Other | Admitting: Hematology

## 2016-09-01 ENCOUNTER — Other Ambulatory Visit: Payer: Medicare Other

## 2016-09-01 ENCOUNTER — Ambulatory Visit: Payer: Medicare Other

## 2016-09-01 LAB — CBC WITH DIFFERENTIAL/PLATELET
Basophils Absolute: 0 10*3/uL (ref 0.0–0.1)
Basophils Relative: 0 %
EOS ABS: 0.7 10*3/uL (ref 0.0–0.7)
EOS PCT: 6 %
HCT: 31 % — ABNORMAL LOW (ref 39.0–52.0)
Hemoglobin: 9.8 g/dL — ABNORMAL LOW (ref 13.0–17.0)
LYMPHS ABS: 1.9 10*3/uL (ref 0.7–4.0)
LYMPHS PCT: 17 %
MCH: 25.7 pg — AB (ref 26.0–34.0)
MCHC: 31.6 g/dL (ref 30.0–36.0)
MCV: 81.4 fL (ref 78.0–100.0)
MONO ABS: 1.3 10*3/uL — AB (ref 0.1–1.0)
Monocytes Relative: 12 %
Neutro Abs: 7.1 10*3/uL (ref 1.7–7.7)
Neutrophils Relative %: 65 %
PLATELETS: 316 10*3/uL (ref 150–400)
RBC: 3.81 MIL/uL — ABNORMAL LOW (ref 4.22–5.81)
RDW: 16.5 % — AB (ref 11.5–15.5)
WBC: 11 10*3/uL — AB (ref 4.0–10.5)

## 2016-09-01 LAB — BASIC METABOLIC PANEL
Anion gap: 5 (ref 5–15)
BUN: 10 mg/dL (ref 6–20)
CHLORIDE: 108 mmol/L (ref 101–111)
CO2: 26 mmol/L (ref 22–32)
CREATININE: 0.42 mg/dL — AB (ref 0.61–1.24)
Calcium: 8.5 mg/dL — ABNORMAL LOW (ref 8.9–10.3)
GFR calc Af Amer: >60 mL/min (ref >60)
GFR calc non Af Amer: >60 mL/min (ref >60)
Glucose, Bld: 89 mg/dL (ref 65–99)
POTASSIUM: 3.9 mmol/L (ref 3.5–5.1)
Sodium: 139 mmol/L (ref 135–145)

## 2016-09-01 LAB — PHOSPHORUS: PHOSPHORUS: 3 mg/dL (ref 2.5–4.6)

## 2016-09-01 LAB — CULTURE, BLOOD (ROUTINE X 2): SPECIAL REQUESTS: ADEQUATE

## 2016-09-01 LAB — MAGNESIUM: Magnesium: 1.6 mg/dL — ABNORMAL LOW (ref 1.7–2.4)

## 2016-09-01 MED ORDER — SUCRALFATE 1 G PO TABS: 1.0000 g | ORAL_TABLET | Freq: Four times a day (QID) | ORAL | 2 refills | Status: DC

## 2016-09-01 MED ORDER — MAGNESIUM SULFATE 2 GM/50ML IV SOLN
2.0000 g | Freq: Once | INTRAVENOUS | Status: AC
  Administered 2016-09-01: 2 g via INTRAVENOUS
  Filled 2016-09-01: qty 50

## 2016-09-01 MED ORDER — PANTOPRAZOLE SODIUM 40 MG PO TBEC: 80.0000 mg | DELAYED_RELEASE_TABLET | Freq: Two times a day (BID) | ORAL | 0 refills | Status: DC

## 2016-09-01 MED ORDER — CEFDINIR 300 MG PO CAPS: 300.0000 mg | ORAL_CAPSULE | Freq: Two times a day (BID) | ORAL | 0 refills | Status: AC

## 2016-09-01 MED ORDER — DARBEPOETIN ALFA 100 MCG/0.5ML IJ SOSY
100.0000 ug | PREFILLED_SYRINGE | Freq: Once | INTRAMUSCULAR | Status: AC
  Administered 2016-09-01: 100 ug via SUBCUTANEOUS
  Filled 2016-09-01: qty 0.5

## 2016-09-01 MED ORDER — HYDROCERIN EX CREA: 1.0000 "application " | TOPICAL_CREAM | Freq: Every day | CUTANEOUS | 0 refills | Status: DC

## 2016-09-01 MED ORDER — METRONIDAZOLE 500 MG PO TABS: 500.0000 mg | ORAL_TABLET | Freq: Three times a day (TID) | ORAL | 0 refills | Status: AC

## 2016-09-01 MED ORDER — HEPARIN SOD (PORK) LOCK FLUSH 100 UNIT/ML IV SOLN
500.0000 [IU] | INTRAVENOUS | Status: AC | PRN
  Administered 2016-09-01: 500 [IU]

## 2016-09-01 NOTE — Progress Notes (Signed)
[facility] called by this Probation officer. Transport home arranged for 1930.
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
## 2016-09-01 NOTE — Progress Notes (Signed)
D/C instructions reviewed w/ pt. All questions answered, pt verbalizes understanding. Pt will need PTAR transport home, states family will be home at [name]. Will arrange for PTAR transport home at that time.

## 2016-09-01 NOTE — Care Management Note (Signed)
Case Management Note  Patient Details  Name: [name] [name] MRN: [id number] Date of Birth: 1965/07/18  Subjective/Objective: [facility] rep [place] aware of [place] orders, ambulance transp-PTAR-forms in shadow chart for nsg to call when ready to d/c back home-confirmed address. No further CM needs.                   Action/Plan:d/c home w/HHC/ambulance transp   Expected Discharge Date:                  Expected Discharge Plan:  [place]  In-House Referral:     Discharge planning Services  CM Consult  Post Acute Care Choice:  Home Health (Active w/[facility]-HHRN) Choice offered to:  Patient  DME Arranged:    DME Agency:     HH Arranged:  RN, Nurse's Aide, Social Work [employer] Agency:  [facility]  Status of Service:  Completed, signed off  If discussed at [employer], dates discussed:    Additional Comments:  Dessa Phi, RN 09/01/2016, 11:51 AM

## 2016-09-03 LAB — CULTURE, BLOOD (ROUTINE X 2)
CULTURE: NO GROWTH
SPECIAL REQUESTS: ADEQUATE

## 2016-09-05 LAB — CULTURE, BLOOD (ROUTINE X 2): Culture: NO GROWTH

## 2016-09-08 DIAGNOSIS — K219 Gastro-esophageal reflux disease without esophagitis: Secondary | ICD-10-CM | POA: Diagnosis not present

## 2016-09-08 DIAGNOSIS — K2210 Ulcer of esophagus without bleeding: Secondary | ICD-10-CM | POA: Diagnosis not present

## 2016-09-09 DIAGNOSIS — L89154 Pressure ulcer of sacral region, stage 4: Secondary | ICD-10-CM | POA: Diagnosis not present

## 2016-09-09 DIAGNOSIS — L89893 Pressure ulcer of other site, stage 3: Secondary | ICD-10-CM | POA: Diagnosis not present

## 2016-09-09 DIAGNOSIS — L89153 Pressure ulcer of sacral region, stage 3: Secondary | ICD-10-CM | POA: Diagnosis not present

## 2016-09-13 ENCOUNTER — Telehealth: Payer: Self-pay | Admitting: *Deleted

## 2016-09-13 NOTE — Telephone Encounter (Signed)
Pt called and left message requesting follow up appt with Dr. Burr Medico.  Dr. Burr Medico notified.  Spoke with pt and was informed that pt had Aranesp injection while in the hospital - either 4/18 or 4/19.   Informed pt that a scheduler will contact pt with appts for lab, office visit, and injection in mid May.  Pt voiced understanding.  Schedule message sent. Pt's    Phone     [phone].

## 2016-09-16 ENCOUNTER — Telehealth: Payer: Self-pay | Admitting: Hematology

## 2016-09-16 DIAGNOSIS — K922 Gastrointestinal hemorrhage, unspecified: Secondary | ICD-10-CM | POA: Diagnosis not present

## 2016-09-16 DIAGNOSIS — Z09 Encounter for follow-up examination after completed treatment for conditions other than malignant neoplasm: Secondary | ICD-10-CM | POA: Diagnosis not present

## 2016-09-16 DIAGNOSIS — G4730 Sleep apnea, unspecified: Secondary | ICD-10-CM | POA: Diagnosis not present

## 2016-09-16 DIAGNOSIS — T8130XS Disruption of wound, unspecified, sequela: Secondary | ICD-10-CM | POA: Diagnosis not present

## 2016-09-16 NOTE — Telephone Encounter (Signed)
lvm to inform pt of 5/18 appt at 3 pm per LOS

## 2016-09-20 DIAGNOSIS — I872 Venous insufficiency (chronic) (peripheral): Secondary | ICD-10-CM | POA: Diagnosis not present

## 2016-09-20 DIAGNOSIS — M869 Osteomyelitis, unspecified: Secondary | ICD-10-CM | POA: Diagnosis not present

## 2016-09-20 DIAGNOSIS — L89894 Pressure ulcer of other site, stage 4: Secondary | ICD-10-CM | POA: Diagnosis not present

## 2016-09-20 DIAGNOSIS — L97811 Non-pressure chronic ulcer of other part of right lower leg limited to breakdown of skin: Secondary | ICD-10-CM | POA: Diagnosis not present

## 2016-09-20 DIAGNOSIS — K209 Esophagitis, unspecified: Secondary | ICD-10-CM | POA: Diagnosis not present

## 2016-09-20 DIAGNOSIS — L89892 Pressure ulcer of other site, stage 2: Secondary | ICD-10-CM | POA: Diagnosis not present

## 2016-09-20 DIAGNOSIS — L89320 Pressure ulcer of left buttock, unstageable: Secondary | ICD-10-CM | POA: Diagnosis not present

## 2016-09-20 DIAGNOSIS — L97221 Non-pressure chronic ulcer of left calf limited to breakdown of skin: Secondary | ICD-10-CM | POA: Diagnosis not present

## 2016-09-20 DIAGNOSIS — L89154 Pressure ulcer of sacral region, stage 4: Secondary | ICD-10-CM | POA: Diagnosis not present

## 2016-09-20 DIAGNOSIS — L89313 Pressure ulcer of right buttock, stage 3: Secondary | ICD-10-CM | POA: Diagnosis not present

## 2016-09-28 DIAGNOSIS — N3020 Other chronic cystitis without hematuria: Secondary | ICD-10-CM | POA: Diagnosis not present

## 2016-09-28 DIAGNOSIS — N319 Neuromuscular dysfunction of bladder, unspecified: Secondary | ICD-10-CM | POA: Diagnosis not present

## 2016-09-28 DIAGNOSIS — L89154 Pressure ulcer of sacral region, stage 4: Secondary | ICD-10-CM | POA: Diagnosis not present

## 2016-09-29 DIAGNOSIS — L89154 Pressure ulcer of sacral region, stage 4: Secondary | ICD-10-CM | POA: Diagnosis not present

## 2016-09-30 ENCOUNTER — Encounter: Payer: Self-pay | Admitting: Hematology

## 2016-09-30 ENCOUNTER — Encounter (HOSPITAL_BASED_OUTPATIENT_CLINIC_OR_DEPARTMENT_OTHER): Payer: Medicare Other | Attending: Internal Medicine

## 2016-09-30 ENCOUNTER — Ambulatory Visit (HOSPITAL_BASED_OUTPATIENT_CLINIC_OR_DEPARTMENT_OTHER): Payer: Medicare Other | Admitting: Hematology

## 2016-09-30 ENCOUNTER — Other Ambulatory Visit (HOSPITAL_BASED_OUTPATIENT_CLINIC_OR_DEPARTMENT_OTHER): Payer: Medicare Other

## 2016-09-30 ENCOUNTER — Other Ambulatory Visit (HOSPITAL_COMMUNITY)
Admission: RE | Admit: 2016-09-30 | Discharge: 2016-09-30 | Disposition: A | Discharge status: Home / Self Care | Payer: Medicare Other | Source: Other Acute Inpatient Hospital | Attending: Internal Medicine | Admitting: Internal Medicine

## 2016-09-30 ENCOUNTER — Telehealth: Payer: Self-pay | Admitting: Hematology

## 2016-09-30 ENCOUNTER — Ambulatory Visit: Payer: Medicare Other

## 2016-09-30 VITALS — BP 116/80 | HR 77 | Temp 98.7°F | Resp 17 | Ht 75.0 in

## 2016-09-30 DIAGNOSIS — G8250 Quadriplegia, unspecified: Secondary | ICD-10-CM | POA: Diagnosis not present

## 2016-09-30 DIAGNOSIS — M869 Osteomyelitis, unspecified: Secondary | ICD-10-CM | POA: Diagnosis not present

## 2016-09-30 DIAGNOSIS — L97812 Non-pressure chronic ulcer of other part of right lower leg with fat layer exposed: Secondary | ICD-10-CM | POA: Insufficient documentation

## 2016-09-30 DIAGNOSIS — D649 Anemia, unspecified: Secondary | ICD-10-CM

## 2016-09-30 DIAGNOSIS — L89893 Pressure ulcer of other site, stage 3: Secondary | ICD-10-CM | POA: Insufficient documentation

## 2016-09-30 DIAGNOSIS — L97822 Non-pressure chronic ulcer of other part of left lower leg with fat layer exposed: Secondary | ICD-10-CM | POA: Diagnosis not present

## 2016-09-30 DIAGNOSIS — K922 Gastrointestinal hemorrhage, unspecified: Secondary | ICD-10-CM

## 2016-09-30 DIAGNOSIS — I10 Essential (primary) hypertension: Secondary | ICD-10-CM | POA: Insufficient documentation

## 2016-09-30 DIAGNOSIS — L89154 Pressure ulcer of sacral region, stage 4: Secondary | ICD-10-CM | POA: Diagnosis not present

## 2016-09-30 DIAGNOSIS — L89104 Pressure ulcer of unspecified part of back, stage 4: Secondary | ICD-10-CM | POA: Diagnosis present

## 2016-09-30 DIAGNOSIS — D473 Essential (hemorrhagic) thrombocythemia: Secondary | ICD-10-CM | POA: Diagnosis not present

## 2016-09-30 DIAGNOSIS — G4730 Sleep apnea, unspecified: Secondary | ICD-10-CM | POA: Diagnosis not present

## 2016-09-30 DIAGNOSIS — D509 Iron deficiency anemia, unspecified: Secondary | ICD-10-CM

## 2016-09-30 DIAGNOSIS — D638 Anemia in other chronic diseases classified elsewhere: Secondary | ICD-10-CM

## 2016-09-30 DIAGNOSIS — M86659 Other chronic osteomyelitis, unspecified thigh: Secondary | ICD-10-CM

## 2016-09-30 LAB — CBC WITH DIFFERENTIAL/PLATELET
BASO%: 0.7 % (ref 0.0–2.0)
Basophils Absolute: 0.1 10*3/uL (ref 0.0–0.1)
EOS%: 2 % (ref 0.0–7.0)
Eosinophils Absolute: 0.4 10*3/uL (ref 0.0–0.5)
HEMATOCRIT: 36.2 % — AB (ref 38.4–49.9)
HGB: 11.5 g/dL — ABNORMAL LOW (ref 13.0–17.1)
LYMPH#: 1.1 10*3/uL (ref 0.9–3.3)
LYMPH%: 6.3 % — ABNORMAL LOW (ref 14.0–49.0)
MCH: 25.4 pg — ABNORMAL LOW (ref 27.2–33.4)
MCHC: 31.7 g/dL — ABNORMAL LOW (ref 32.0–36.0)
MCV: 80.3 fL (ref 79.3–98.0)
MONO#: 1.5 10*3/uL — ABNORMAL HIGH (ref 0.1–0.9)
MONO%: 8.7 % (ref 0.0–14.0)
NEUT%: 82.3 % — ABNORMAL HIGH (ref 39.0–75.0)
NEUTROS ABS: 14.2 10*3/uL — AB (ref 1.5–6.5)
Platelets: 395 10*3/uL (ref 140–400)
RBC: 4.51 10*6/uL (ref 4.20–5.82)
RDW: 16.8 % — AB (ref 11.0–14.6)
WBC: 17.3 10*3/uL — AB (ref 4.0–10.3)

## 2016-09-30 NOTE — Progress Notes (Signed)
Patient did not receive Aranesp today. HGB 11.5.

## 2016-09-30 NOTE — Telephone Encounter (Signed)
Appointments scheduled per 09/30/16 los. Patient was given a copy of the AVS report and appointment schedule per 09/30/16 los.
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
## 2016-10-03 DIAGNOSIS — N390 Urinary tract infection, site not specified: Secondary | ICD-10-CM | POA: Diagnosis not present

## 2016-10-03 LAB — IRON AND TIBC
%SAT: 14 % — AB (ref 20–55)
IRON: 22 ug/dL — AB (ref 42–163)
TIBC: 156 ug/dL — ABNORMAL LOW (ref 202–409)
UIBC: 134 ug/dL (ref 117–376)

## 2016-10-03 LAB — FERRITIN: Ferritin: 1620 ng/ml — ABNORMAL HIGH (ref 22–316)

## 2016-10-04 LAB — AEROBIC CULTURE W GRAM STAIN (SUPERFICIAL SPECIMEN)

## 2016-10-04 LAB — AEROBIC CULTURE  (SUPERFICIAL SPECIMEN)

## 2016-10-05 DIAGNOSIS — G8250 Quadriplegia, unspecified: Secondary | ICD-10-CM | POA: Diagnosis not present

## 2016-10-05 DIAGNOSIS — M8660 Other chronic osteomyelitis, unspecified site: Secondary | ICD-10-CM | POA: Diagnosis not present

## 2016-10-11 ENCOUNTER — Ambulatory Visit: Payer: Medicare Other | Admitting: Infectious Diseases

## 2016-10-11 DIAGNOSIS — M869 Osteomyelitis, unspecified: Secondary | ICD-10-CM | POA: Diagnosis not present

## 2016-10-11 DIAGNOSIS — S31809A Unspecified open wound of unspecified buttock, initial encounter: Secondary | ICD-10-CM | POA: Diagnosis not present

## 2016-10-11 DIAGNOSIS — Z933 Colostomy status: Secondary | ICD-10-CM | POA: Diagnosis not present

## 2016-10-11 DIAGNOSIS — L8944 Pressure ulcer of contiguous site of back, buttock and hip, stage 4: Secondary | ICD-10-CM | POA: Diagnosis not present

## 2016-10-11 DIAGNOSIS — S31102A Unspecified open wound of abdominal wall, epigastric region without penetration into peritoneal cavity, initial encounter: Secondary | ICD-10-CM | POA: Diagnosis not present

## 2016-10-12 DIAGNOSIS — L8944 Pressure ulcer of contiguous site of back, buttock and hip, stage 4: Secondary | ICD-10-CM | POA: Diagnosis not present

## 2016-10-12 DIAGNOSIS — S31102A Unspecified open wound of abdominal wall, epigastric region without penetration into peritoneal cavity, initial encounter: Secondary | ICD-10-CM | POA: Diagnosis not present

## 2016-10-12 DIAGNOSIS — S31809A Unspecified open wound of unspecified buttock, initial encounter: Secondary | ICD-10-CM | POA: Diagnosis not present

## 2016-10-12 DIAGNOSIS — Z933 Colostomy status: Secondary | ICD-10-CM | POA: Diagnosis not present

## 2016-10-13 DIAGNOSIS — A419 Sepsis, unspecified organism: Secondary | ICD-10-CM | POA: Diagnosis not present

## 2016-10-14 ENCOUNTER — Encounter (HOSPITAL_BASED_OUTPATIENT_CLINIC_OR_DEPARTMENT_OTHER): Payer: Medicare Other | Attending: Internal Medicine

## 2016-10-14 DIAGNOSIS — L89892 Pressure ulcer of other site, stage 2: Secondary | ICD-10-CM | POA: Insufficient documentation

## 2016-10-14 DIAGNOSIS — G8250 Quadriplegia, unspecified: Secondary | ICD-10-CM | POA: Insufficient documentation

## 2016-10-14 DIAGNOSIS — I10 Essential (primary) hypertension: Secondary | ICD-10-CM | POA: Insufficient documentation

## 2016-10-14 DIAGNOSIS — S31010A Laceration without foreign body of lower back and pelvis without penetration into retroperitoneum, initial encounter: Secondary | ICD-10-CM | POA: Insufficient documentation

## 2016-10-14 DIAGNOSIS — X58XXXA Exposure to other specified factors, initial encounter: Secondary | ICD-10-CM | POA: Insufficient documentation

## 2016-10-14 DIAGNOSIS — L89894 Pressure ulcer of other site, stage 4: Secondary | ICD-10-CM | POA: Insufficient documentation

## 2016-10-14 DIAGNOSIS — G4730 Sleep apnea, unspecified: Secondary | ICD-10-CM | POA: Insufficient documentation

## 2016-10-14 DIAGNOSIS — L89154 Pressure ulcer of sacral region, stage 4: Secondary | ICD-10-CM | POA: Insufficient documentation

## 2016-10-17 ENCOUNTER — Other Ambulatory Visit (HOSPITAL_COMMUNITY)
Admission: RE | Admit: 2016-10-17 | Discharge: 2016-10-17 | Disposition: A | Discharge status: Home / Self Care | Payer: Medicare Other | Source: Other Acute Inpatient Hospital | Attending: Internal Medicine | Admitting: Internal Medicine

## 2016-10-17 DIAGNOSIS — L89154 Pressure ulcer of sacral region, stage 4: Secondary | ICD-10-CM | POA: Diagnosis not present

## 2016-10-17 DIAGNOSIS — B965 Pseudomonas (aeruginosa) (mallei) (pseudomallei) as the cause of diseases classified elsewhere: Secondary | ICD-10-CM | POA: Diagnosis not present

## 2016-10-17 DIAGNOSIS — L89153 Pressure ulcer of sacral region, stage 3: Secondary | ICD-10-CM | POA: Diagnosis not present

## 2016-10-17 DIAGNOSIS — G8250 Quadriplegia, unspecified: Secondary | ICD-10-CM | POA: Diagnosis not present

## 2016-10-17 DIAGNOSIS — L89893 Pressure ulcer of other site, stage 3: Secondary | ICD-10-CM | POA: Diagnosis not present

## 2016-10-17 DIAGNOSIS — L89104 Pressure ulcer of unspecified part of back, stage 4: Secondary | ICD-10-CM | POA: Diagnosis present

## 2016-10-17 DIAGNOSIS — X58XXXA Exposure to other specified factors, initial encounter: Secondary | ICD-10-CM | POA: Diagnosis not present

## 2016-10-17 DIAGNOSIS — S31010A Laceration without foreign body of lower back and pelvis without penetration into retroperitoneum, initial encounter: Secondary | ICD-10-CM | POA: Diagnosis not present

## 2016-10-17 DIAGNOSIS — B964 Proteus (mirabilis) (morganii) as the cause of diseases classified elsewhere: Secondary | ICD-10-CM | POA: Diagnosis not present

## 2016-10-17 DIAGNOSIS — G4730 Sleep apnea, unspecified: Secondary | ICD-10-CM | POA: Diagnosis not present

## 2016-10-17 DIAGNOSIS — I10 Essential (primary) hypertension: Secondary | ICD-10-CM | POA: Diagnosis not present

## 2016-10-17 DIAGNOSIS — L89894 Pressure ulcer of other site, stage 4: Secondary | ICD-10-CM | POA: Diagnosis not present

## 2016-10-17 DIAGNOSIS — L89892 Pressure ulcer of other site, stage 2: Secondary | ICD-10-CM | POA: Diagnosis not present

## 2016-10-17 NOTE — Addendum Note (Signed)
Addendum  created 10/17/16 0948 by Myrtie Soman, MD   Sign clinical note

## 2016-10-18 ENCOUNTER — Emergency Department (HOSPITAL_COMMUNITY)
Admission: EM | Admit: 2016-10-18 | Discharge: 2016-10-18 | Disposition: A | Discharge status: Home / Self Care | Payer: Medicare Other | Attending: Emergency Medicine | Admitting: Emergency Medicine

## 2016-10-18 ENCOUNTER — Encounter (HOSPITAL_COMMUNITY): Payer: Self-pay | Admitting: Emergency Medicine

## 2016-10-18 DIAGNOSIS — R404 Transient alteration of awareness: Secondary | ICD-10-CM | POA: Diagnosis not present

## 2016-10-18 DIAGNOSIS — L89159 Pressure ulcer of sacral region, unspecified stage: Secondary | ICD-10-CM | POA: Diagnosis not present

## 2016-10-18 DIAGNOSIS — Z79899 Other long term (current) drug therapy: Secondary | ICD-10-CM | POA: Diagnosis not present

## 2016-10-18 DIAGNOSIS — L89150 Pressure ulcer of sacral region, unstageable: Secondary | ICD-10-CM

## 2016-10-18 DIAGNOSIS — R531 Weakness: Secondary | ICD-10-CM | POA: Diagnosis not present

## 2016-10-18 DIAGNOSIS — Z933 Colostomy status: Secondary | ICD-10-CM | POA: Diagnosis not present

## 2016-10-18 DIAGNOSIS — I10 Essential (primary) hypertension: Secondary | ICD-10-CM | POA: Diagnosis not present

## 2016-10-18 DIAGNOSIS — S31102A Unspecified open wound of abdominal wall, epigastric region without penetration into peritoneal cavity, initial encounter: Secondary | ICD-10-CM | POA: Diagnosis not present

## 2016-10-18 DIAGNOSIS — L8944 Pressure ulcer of contiguous site of back, buttock and hip, stage 4: Secondary | ICD-10-CM | POA: Diagnosis not present

## 2016-10-18 DIAGNOSIS — S31809A Unspecified open wound of unspecified buttock, initial encounter: Secondary | ICD-10-CM | POA: Diagnosis not present

## 2016-10-18 DIAGNOSIS — G8250 Quadriplegia, unspecified: Secondary | ICD-10-CM | POA: Diagnosis not present

## 2016-10-18 DIAGNOSIS — L8992 Pressure ulcer of unspecified site, stage 2: Secondary | ICD-10-CM | POA: Diagnosis not present

## 2016-10-18 NOTE — ED Triage Notes (Addendum)
Per [facility] pt is a quadriplegia and was seen at wound center yesterday for 2 decubitus ulcers on sacrum area and lumbar region of back. Started patient on rocephin. Pt called EMS wanting to be seen.  Pt states sees wound nurse every 2 weeks.

## 2016-10-18 NOTE — ED Notes (Signed)
Bed: [facility] Expected date:  Expected time:  Means of arrival:  Comments: Room 10

## 2016-10-18 NOTE — ED Notes (Signed)
Patient pressure ulcers packed with wet to dry dressings.

## 2016-10-18 NOTE — Discharge Instructions (Signed)
Return here at once for fever, vomiting, severe drainage from your wound is, or any other problems. Continue taking your antibiotics

## 2016-10-18 NOTE — ED Notes (Signed)
Bed: [facility] Expected date:  Expected time:  Means of arrival:  Comments: 
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
## 2016-10-18 NOTE — ED Notes (Signed)
Bed: [facility] Expected date:  Expected time:  Means of arrival:  Comments: 

## 2016-10-20 LAB — AEROBIC CULTURE W GRAM STAIN (SUPERFICIAL SPECIMEN)

## 2016-10-20 LAB — AEROBIC CULTURE  (SUPERFICIAL SPECIMEN)

## 2016-10-24 DIAGNOSIS — S81002A Unspecified open wound, left knee, initial encounter: Secondary | ICD-10-CM | POA: Diagnosis not present

## 2016-10-24 DIAGNOSIS — L89894 Pressure ulcer of other site, stage 4: Secondary | ICD-10-CM | POA: Diagnosis not present

## 2016-10-24 DIAGNOSIS — G8250 Quadriplegia, unspecified: Secondary | ICD-10-CM | POA: Diagnosis not present

## 2016-10-24 DIAGNOSIS — G4730 Sleep apnea, unspecified: Secondary | ICD-10-CM | POA: Diagnosis not present

## 2016-10-24 DIAGNOSIS — L89154 Pressure ulcer of sacral region, stage 4: Secondary | ICD-10-CM | POA: Diagnosis not present

## 2016-10-24 DIAGNOSIS — I10 Essential (primary) hypertension: Secondary | ICD-10-CM | POA: Diagnosis not present

## 2016-10-24 DIAGNOSIS — S31010A Laceration without foreign body of lower back and pelvis without penetration into retroperitoneum, initial encounter: Secondary | ICD-10-CM | POA: Diagnosis not present

## 2016-10-24 DIAGNOSIS — L89892 Pressure ulcer of other site, stage 2: Secondary | ICD-10-CM | POA: Diagnosis not present

## 2016-10-24 DIAGNOSIS — S71101A Unspecified open wound, right thigh, initial encounter: Secondary | ICD-10-CM | POA: Diagnosis not present

## 2016-10-25 DIAGNOSIS — L97811 Non-pressure chronic ulcer of other part of right lower leg limited to breakdown of skin: Secondary | ICD-10-CM | POA: Diagnosis not present

## 2016-10-25 DIAGNOSIS — L89320 Pressure ulcer of left buttock, unstageable: Secondary | ICD-10-CM | POA: Diagnosis not present

## 2016-10-25 DIAGNOSIS — L97221 Non-pressure chronic ulcer of left calf limited to breakdown of skin: Secondary | ICD-10-CM | POA: Diagnosis not present

## 2016-10-25 DIAGNOSIS — M869 Osteomyelitis, unspecified: Secondary | ICD-10-CM | POA: Diagnosis not present

## 2016-10-25 DIAGNOSIS — L89894 Pressure ulcer of other site, stage 4: Secondary | ICD-10-CM | POA: Diagnosis not present

## 2016-10-25 DIAGNOSIS — L89313 Pressure ulcer of right buttock, stage 3: Secondary | ICD-10-CM | POA: Diagnosis not present

## 2016-10-25 DIAGNOSIS — L89154 Pressure ulcer of sacral region, stage 4: Secondary | ICD-10-CM | POA: Diagnosis not present

## 2016-10-25 DIAGNOSIS — I872 Venous insufficiency (chronic) (peripheral): Secondary | ICD-10-CM | POA: Diagnosis not present

## 2016-10-25 DIAGNOSIS — K209 Esophagitis, unspecified: Secondary | ICD-10-CM | POA: Diagnosis not present

## 2016-10-25 DIAGNOSIS — L89892 Pressure ulcer of other site, stage 2: Secondary | ICD-10-CM | POA: Diagnosis not present

## 2016-10-28 ENCOUNTER — Other Ambulatory Visit (HOSPITAL_BASED_OUTPATIENT_CLINIC_OR_DEPARTMENT_OTHER): Payer: Medicare Other

## 2016-10-28 ENCOUNTER — Ambulatory Visit: Payer: Medicare Other

## 2016-10-28 DIAGNOSIS — D509 Iron deficiency anemia, unspecified: Secondary | ICD-10-CM

## 2016-10-28 DIAGNOSIS — D638 Anemia in other chronic diseases classified elsewhere: Secondary | ICD-10-CM

## 2016-10-28 LAB — CBC WITH DIFFERENTIAL/PLATELET
BASO%: 0.6 % (ref 0.0–2.0)
BASOS ABS: 0.1 10*3/uL (ref 0.0–0.1)
EOS%: 2.7 % (ref 0.0–7.0)
Eosinophils Absolute: 0.4 10*3/uL (ref 0.0–0.5)
HCT: 35.8 % — ABNORMAL LOW (ref 38.4–49.9)
HGB: 10.7 g/dL — ABNORMAL LOW (ref 13.0–17.1)
LYMPH#: 2.2 10*3/uL (ref 0.9–3.3)
LYMPH%: 16 % (ref 14.0–49.0)
MCH: 25 pg — ABNORMAL LOW (ref 27.2–33.4)
MCHC: 29.9 g/dL — AB (ref 32.0–36.0)
MCV: 83.6 fL (ref 79.3–98.0)
MONO#: 2.3 10*3/uL — ABNORMAL HIGH (ref 0.1–0.9)
MONO%: 16.5 % — ABNORMAL HIGH (ref 0.0–14.0)
NEUT%: 64.2 % (ref 39.0–75.0)
NEUTROS ABS: 9 10*3/uL — AB (ref 1.5–6.5)
NRBC: 0 % (ref 0–0)
PLATELETS: 275 10*3/uL (ref 140–400)
RBC: 4.28 10*6/uL (ref 4.20–5.82)
RDW: 15.9 % — ABNORMAL HIGH (ref 11.0–14.6)
WBC: 14 10*3/uL — ABNORMAL HIGH (ref 4.0–10.3)

## 2016-10-28 MED ORDER — DARBEPOETIN ALFA 100 MCG/0.5ML IJ SOSY: 100.0000 ug | PREFILLED_SYRINGE | Freq: Once | INTRAMUSCULAR | Status: DC

## 2016-10-28 NOTE — Progress Notes (Signed)
Pt not receiving injection today due to insurance denial. Hgb 10.7 today. Let pt know that injection will not be given today and if there are any questions that he can give [place] a call. Porsche Cates LPN

## 2016-10-31 ENCOUNTER — Inpatient Hospital Stay (HOSPITAL_COMMUNITY)
Admission: EM | Admit: 2016-10-31 | Discharge: 2016-11-07 | DRG: 853 | Disposition: A | Discharge status: Skilled Nursing Facility | Payer: Medicare Other | Attending: Internal Medicine | Admitting: Internal Medicine

## 2016-10-31 ENCOUNTER — Other Ambulatory Visit: Payer: Self-pay

## 2016-10-31 ENCOUNTER — Emergency Department (HOSPITAL_COMMUNITY): Payer: Medicare Other

## 2016-10-31 ENCOUNTER — Encounter (HOSPITAL_COMMUNITY): Payer: Self-pay | Admitting: Emergency Medicine

## 2016-10-31 DIAGNOSIS — R000 Tachycardia, unspecified: Secondary | ICD-10-CM | POA: Diagnosis present

## 2016-10-31 DIAGNOSIS — G4730 Sleep apnea, unspecified: Secondary | ICD-10-CM | POA: Diagnosis not present

## 2016-10-31 DIAGNOSIS — N390 Urinary tract infection, site not specified: Secondary | ICD-10-CM | POA: Diagnosis present

## 2016-10-31 DIAGNOSIS — Z933 Colostomy status: Secondary | ICD-10-CM

## 2016-10-31 DIAGNOSIS — L89894 Pressure ulcer of other site, stage 4: Secondary | ICD-10-CM | POA: Diagnosis not present

## 2016-10-31 DIAGNOSIS — J9610 Chronic respiratory failure, unspecified whether with hypoxia or hypercapnia: Secondary | ICD-10-CM | POA: Diagnosis not present

## 2016-10-31 DIAGNOSIS — Z79899 Other long term (current) drug therapy: Secondary | ICD-10-CM | POA: Diagnosis not present

## 2016-10-31 DIAGNOSIS — L8994 Pressure ulcer of unspecified site, stage 4: Secondary | ICD-10-CM | POA: Diagnosis not present

## 2016-10-31 DIAGNOSIS — M24552 Contracture, left hip: Secondary | ICD-10-CM | POA: Diagnosis present

## 2016-10-31 DIAGNOSIS — K219 Gastro-esophageal reflux disease without esophagitis: Secondary | ICD-10-CM | POA: Diagnosis not present

## 2016-10-31 DIAGNOSIS — E43 Unspecified severe protein-calorie malnutrition: Secondary | ICD-10-CM | POA: Diagnosis not present

## 2016-10-31 DIAGNOSIS — L89109 Pressure ulcer of unspecified part of back, unspecified stage: Secondary | ICD-10-CM | POA: Diagnosis not present

## 2016-10-31 DIAGNOSIS — X58XXXA Exposure to other specified factors, initial encounter: Secondary | ICD-10-CM | POA: Diagnosis not present

## 2016-10-31 DIAGNOSIS — Z888 Allergy status to other drugs, medicaments and biological substances status: Secondary | ICD-10-CM

## 2016-10-31 DIAGNOSIS — L8990 Pressure ulcer of unspecified site, unspecified stage: Secondary | ICD-10-CM

## 2016-10-31 DIAGNOSIS — G4733 Obstructive sleep apnea (adult) (pediatric): Secondary | ICD-10-CM | POA: Diagnosis not present

## 2016-10-31 DIAGNOSIS — L089 Local infection of the skin and subcutaneous tissue, unspecified: Secondary | ICD-10-CM | POA: Diagnosis not present

## 2016-10-31 DIAGNOSIS — L89110 Pressure ulcer of right upper back, unstageable: Secondary | ICD-10-CM | POA: Diagnosis not present

## 2016-10-31 DIAGNOSIS — E876 Hypokalemia: Secondary | ICD-10-CM | POA: Diagnosis present

## 2016-10-31 DIAGNOSIS — Z87440 Personal history of urinary (tract) infections: Secondary | ICD-10-CM | POA: Diagnosis not present

## 2016-10-31 DIAGNOSIS — M4624 Osteomyelitis of vertebra, thoracic region: Secondary | ICD-10-CM | POA: Diagnosis not present

## 2016-10-31 DIAGNOSIS — Z8711 Personal history of peptic ulcer disease: Secondary | ICD-10-CM | POA: Diagnosis not present

## 2016-10-31 DIAGNOSIS — F329 Major depressive disorder, single episode, unspecified: Secondary | ICD-10-CM | POA: Diagnosis present

## 2016-10-31 DIAGNOSIS — J9600 Acute respiratory failure, unspecified whether with hypoxia or hypercapnia: Secondary | ICD-10-CM | POA: Diagnosis not present

## 2016-10-31 DIAGNOSIS — G8250 Quadriplegia, unspecified: Secondary | ICD-10-CM | POA: Diagnosis present

## 2016-10-31 DIAGNOSIS — L89119 Pressure ulcer of right upper back, unspecified stage: Secondary | ICD-10-CM | POA: Diagnosis not present

## 2016-10-31 DIAGNOSIS — L89154 Pressure ulcer of sacral region, stage 4: Secondary | ICD-10-CM | POA: Diagnosis not present

## 2016-10-31 DIAGNOSIS — T148XXA Other injury of unspecified body region, initial encounter: Secondary | ICD-10-CM

## 2016-10-31 DIAGNOSIS — R6520 Severe sepsis without septic shock: Secondary | ICD-10-CM | POA: Diagnosis not present

## 2016-10-31 DIAGNOSIS — D509 Iron deficiency anemia, unspecified: Secondary | ICD-10-CM | POA: Diagnosis present

## 2016-10-31 DIAGNOSIS — L89892 Pressure ulcer of other site, stage 2: Secondary | ICD-10-CM | POA: Diagnosis not present

## 2016-10-31 DIAGNOSIS — E662 Morbid (severe) obesity with alveolar hypoventilation: Secondary | ICD-10-CM | POA: Diagnosis present

## 2016-10-31 DIAGNOSIS — R6510 Systemic inflammatory response syndrome (SIRS) of non-infectious origin without acute organ dysfunction: Secondary | ICD-10-CM | POA: Diagnosis present

## 2016-10-31 DIAGNOSIS — Z803 Family history of malignant neoplasm of breast: Secondary | ICD-10-CM

## 2016-10-31 DIAGNOSIS — Z833 Family history of diabetes mellitus: Secondary | ICD-10-CM

## 2016-10-31 DIAGNOSIS — G839 Paralytic syndrome, unspecified: Secondary | ICD-10-CM | POA: Diagnosis not present

## 2016-10-31 DIAGNOSIS — D638 Anemia in other chronic diseases classified elsewhere: Secondary | ICD-10-CM | POA: Diagnosis not present

## 2016-10-31 DIAGNOSIS — A419 Sepsis, unspecified organism: Secondary | ICD-10-CM | POA: Diagnosis not present

## 2016-10-31 DIAGNOSIS — L89112 Pressure ulcer of right upper back, stage 2: Secondary | ICD-10-CM | POA: Diagnosis not present

## 2016-10-31 DIAGNOSIS — S2190XA Unspecified open wound of unspecified part of thorax, initial encounter: Secondary | ICD-10-CM | POA: Diagnosis not present

## 2016-10-31 DIAGNOSIS — Z8701 Personal history of pneumonia (recurrent): Secondary | ICD-10-CM | POA: Diagnosis not present

## 2016-10-31 DIAGNOSIS — S31010A Laceration without foreign body of lower back and pelvis without penetration into retroperitoneum, initial encounter: Secondary | ICD-10-CM | POA: Diagnosis not present

## 2016-10-31 DIAGNOSIS — S31000A Unspecified open wound of lower back and pelvis without penetration into retroperitoneum, initial encounter: Secondary | ICD-10-CM | POA: Diagnosis not present

## 2016-10-31 DIAGNOSIS — Z9359 Other cystostomy status: Secondary | ICD-10-CM

## 2016-10-31 DIAGNOSIS — Z9989 Dependence on other enabling machines and devices: Secondary | ICD-10-CM | POA: Diagnosis not present

## 2016-10-31 DIAGNOSIS — I10 Essential (primary) hypertension: Secondary | ICD-10-CM | POA: Diagnosis present

## 2016-10-31 DIAGNOSIS — R509 Fever, unspecified: Secondary | ICD-10-CM | POA: Diagnosis present

## 2016-10-31 DIAGNOSIS — S41001A Unspecified open wound of right shoulder, initial encounter: Secondary | ICD-10-CM | POA: Diagnosis not present

## 2016-10-31 DIAGNOSIS — Z881 Allergy status to other antibiotic agents status: Secondary | ICD-10-CM

## 2016-10-31 DIAGNOSIS — M24551 Contracture, right hip: Secondary | ICD-10-CM | POA: Diagnosis present

## 2016-10-31 DIAGNOSIS — L89000 Pressure ulcer of unspecified elbow, unstageable: Secondary | ICD-10-CM | POA: Diagnosis not present

## 2016-10-31 LAB — COMPREHENSIVE METABOLIC PANEL
ALK PHOS: 77 U/L (ref 38–126)
ALT: 22 U/L (ref 17–63)
AST: 25 U/L (ref 15–41)
Albumin: 2.8 g/dL — ABNORMAL LOW (ref 3.5–5.0)
Anion gap: 8 (ref 5–15)
BUN: 23 mg/dL — ABNORMAL HIGH (ref 6–20)
CALCIUM: 8.9 mg/dL (ref 8.9–10.3)
CHLORIDE: 107 mmol/L (ref 101–111)
CO2: 24 mmol/L (ref 22–32)
CREATININE: 0.71 mg/dL (ref 0.61–1.24)
GFR calc Af Amer: >60 mL/min (ref >60)
GFR calc non Af Amer: >60 mL/min (ref >60)
Glucose, Bld: 136 mg/dL — ABNORMAL HIGH (ref 65–99)
Potassium: 4 mmol/L (ref 3.5–5.1)
SODIUM: 139 mmol/L (ref 135–145)
Total Bilirubin: <0.1 mg/dL — ABNORMAL LOW (ref 0.3–1.2)
Total Protein: 7.6 g/dL (ref 6.5–8.1)

## 2016-10-31 LAB — CBC WITH DIFFERENTIAL/PLATELET
BASOS PCT: 0 %
Basophils Absolute: 0 10*3/uL (ref 0.0–0.1)
EOS PCT: 1 %
Eosinophils Absolute: 0.1 10*3/uL (ref 0.0–0.7)
HEMATOCRIT: 30.2 % — AB (ref 39.0–52.0)
HEMOGLOBIN: 9.4 g/dL — AB (ref 13.0–17.0)
LYMPHS ABS: 1.6 10*3/uL (ref 0.7–4.0)
LYMPHS PCT: 12 %
MCH: 24.9 pg — AB (ref 26.0–34.0)
MCHC: 31.1 g/dL (ref 30.0–36.0)
MCV: 79.9 fL (ref 78.0–100.0)
MONOS PCT: 5 %
Monocytes Absolute: 0.7 10*3/uL (ref 0.1–1.0)
NEUTROS ABS: 11.3 10*3/uL — AB (ref 1.7–7.7)
Neutrophils Relative %: 82 %
Platelets: 412 10*3/uL — ABNORMAL HIGH (ref 150–400)
RBC: 3.78 MIL/uL — ABNORMAL LOW (ref 4.22–5.81)
RDW: 15.7 % — ABNORMAL HIGH (ref 11.5–15.5)
WBC: 13.7 10*3/uL — ABNORMAL HIGH (ref 4.0–10.5)

## 2016-10-31 LAB — PROTIME-INR
INR: 1.09
Prothrombin Time: 14.1 seconds (ref 11.4–15.2)

## 2016-10-31 LAB — URINALYSIS, ROUTINE W REFLEX MICROSCOPIC
BILIRUBIN URINE: NEGATIVE
Glucose, UA: NEGATIVE mg/dL
KETONES UR: NEGATIVE mg/dL
Nitrite: NEGATIVE
PH: 5 (ref 5.0–8.0)
PROTEIN: NEGATIVE mg/dL
Specific Gravity, Urine: 1.006 (ref 1.005–1.030)

## 2016-10-31 LAB — I-STAT CG4 LACTIC ACID, ED: Lactic Acid, Venous: 2.23 mmol/L (ref 0.5–1.9)

## 2016-10-31 MED ORDER — PIPERACILLIN-TAZOBACTAM 3.375 G IVPB 30 MIN
3.3750 g | Freq: Once | INTRAVENOUS | Status: AC
  Administered 2016-10-31: 3.375 g via INTRAVENOUS
  Filled 2016-10-31: qty 50

## 2016-10-31 MED ORDER — SODIUM CHLORIDE 0.9 % IV BOLUS (SEPSIS)
1000.0000 mL | Freq: Once | INTRAVENOUS | Status: AC
  Administered 2016-10-31: 1000 mL via INTRAVENOUS

## 2016-10-31 MED ORDER — ACETAMINOPHEN 500 MG PO TABS
1000.0000 mg | ORAL_TABLET | Freq: Once | ORAL | Status: AC
Start: 2016-10-31 — End: 2016-10-31
  Administered 2016-10-31: 1000 mg via ORAL
  Filled 2016-10-31: qty 2

## 2016-10-31 NOTE — ED Triage Notes (Signed)
Patient is complaining of low grade temp and generally not feeling well. Patient is from home. Patient went to wound care today. Patient feels like one of the bed sore is infected.
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
## 2016-11-01 ENCOUNTER — Inpatient Hospital Stay (HOSPITAL_COMMUNITY): Payer: Medicare Other

## 2016-11-01 DIAGNOSIS — Z833 Family history of diabetes mellitus: Secondary | ICD-10-CM

## 2016-11-01 DIAGNOSIS — L89110 Pressure ulcer of right upper back, unstageable: Secondary | ICD-10-CM

## 2016-11-01 DIAGNOSIS — Z803 Family history of malignant neoplasm of breast: Secondary | ICD-10-CM

## 2016-11-01 DIAGNOSIS — Z79899 Other long term (current) drug therapy: Secondary | ICD-10-CM

## 2016-11-01 LAB — BASIC METABOLIC PANEL
ANION GAP: 7 (ref 5–15)
BUN: 21 mg/dL — ABNORMAL HIGH (ref 6–20)
CHLORIDE: 111 mmol/L (ref 101–111)
CO2: 23 mmol/L (ref 22–32)
Calcium: 8.5 mg/dL — ABNORMAL LOW (ref 8.9–10.3)
Creatinine, Ser: 0.47 mg/dL — ABNORMAL LOW (ref 0.61–1.24)
GFR calc non Af Amer: >60 mL/min (ref >60)
Glucose, Bld: 114 mg/dL — ABNORMAL HIGH (ref 65–99)
POTASSIUM: 3.5 mmol/L (ref 3.5–5.1)
SODIUM: 141 mmol/L (ref 135–145)

## 2016-11-01 LAB — MRSA PCR SCREENING: MRSA by PCR: POSITIVE — AB

## 2016-11-01 LAB — CBC
HEMATOCRIT: 28 % — AB (ref 39.0–52.0)
HEMOGLOBIN: 8.6 g/dL — AB (ref 13.0–17.0)
MCH: 24.9 pg — ABNORMAL LOW (ref 26.0–34.0)
MCHC: 30.7 g/dL (ref 30.0–36.0)
MCV: 81.2 fL (ref 78.0–100.0)
Platelets: 347 10*3/uL (ref 150–400)
RBC: 3.45 MIL/uL — AB (ref 4.22–5.81)
RDW: 16 % — ABNORMAL HIGH (ref 11.5–15.5)
WBC: 8.9 10*3/uL (ref 4.0–10.5)

## 2016-11-01 LAB — LACTIC ACID, PLASMA
LACTIC ACID, VENOUS: 1.7 mmol/L (ref 0.5–1.9)
Lactic Acid, Venous: 1.7 mmol/L (ref 0.5–1.9)

## 2016-11-01 MED ORDER — ACETAMINOPHEN 325 MG PO TABS: 650.0000 mg | ORAL_TABLET | Freq: Four times a day (QID) | ORAL | Status: DC | PRN

## 2016-11-01 MED ORDER — ONDANSETRON HCL 4 MG/2ML IJ SOLN: 4.0000 mg | Freq: Four times a day (QID) | INTRAMUSCULAR | Status: DC | PRN

## 2016-11-01 MED ORDER — HYDROCERIN EX CREA
1.0000 "application " | TOPICAL_CREAM | Freq: Every day | CUTANEOUS | Status: DC
  Administered 2016-11-01 – 2016-11-07 (×7): 1 via TOPICAL
  Filled 2016-11-01: qty 113

## 2016-11-01 MED ORDER — DARIFENACIN HYDROBROMIDE ER 15 MG PO TB24
15.0000 mg | ORAL_TABLET | Freq: Every day | ORAL | Status: DC
  Administered 2016-11-01 – 2016-11-07 (×7): 15 mg via ORAL
  Filled 2016-11-01 (×7): qty 1

## 2016-11-01 MED ORDER — DOCUSATE SODIUM 100 MG PO CAPS
200.0000 mg | ORAL_CAPSULE | Freq: Two times a day (BID) | ORAL | Status: DC
  Administered 2016-11-01 – 2016-11-07 (×14): 200 mg via ORAL
  Filled 2016-11-01 (×13): qty 2

## 2016-11-01 MED ORDER — JUVEN PO PACK
1.0000 | PACK | Freq: Two times a day (BID) | ORAL | Status: DC
  Administered 2016-11-01 – 2016-11-02 (×3): 1 via ORAL
  Filled 2016-11-01 (×15): qty 1

## 2016-11-01 MED ORDER — GADOBENATE DIMEGLUMINE 529 MG/ML IV SOLN
20.0000 mL | Freq: Once | INTRAVENOUS | Status: AC | PRN
  Administered 2016-11-01: 20 mL via INTRAVENOUS

## 2016-11-01 MED ORDER — PIPERACILLIN-TAZOBACTAM 3.375 G IVPB
3.3750 g | Freq: Three times a day (TID) | INTRAVENOUS | Status: DC
  Administered 2016-11-01 – 2016-11-07 (×20): 3.375 g via INTRAVENOUS
  Filled 2016-11-01 (×24): qty 50

## 2016-11-01 MED ORDER — FERROUS SULFATE 325 (65 FE) MG PO TABS
325.0000 mg | ORAL_TABLET | Freq: Three times a day (TID) | ORAL | Status: DC
  Administered 2016-11-01 – 2016-11-07 (×21): 325 mg via ORAL
  Filled 2016-11-01 (×23): qty 1

## 2016-11-01 MED ORDER — BACLOFEN 20 MG PO TABS
20.0000 mg | ORAL_TABLET | Freq: Four times a day (QID) | ORAL | Status: DC
  Administered 2016-11-01 – 2016-11-07 (×27): 20 mg via ORAL
  Filled 2016-11-01: qty 2
  Filled 2016-11-01 (×2): qty 1
  Filled 2016-11-01: qty 2
  Filled 2016-11-01 (×5): qty 1
  Filled 2016-11-01: qty 2
  Filled 2016-11-01 (×3): qty 1
  Filled 2016-11-01: qty 2
  Filled 2016-11-01 (×2): qty 1
  Filled 2016-11-01: qty 2
  Filled 2016-11-01 (×4): qty 1
  Filled 2016-11-01 (×4): qty 2
  Filled 2016-11-01 (×2): qty 1

## 2016-11-01 MED ORDER — COLLAGENASE 250 UNIT/GM EX OINT
TOPICAL_OINTMENT | Freq: Every day | CUTANEOUS | Status: DC
  Administered 2016-11-02 – 2016-11-07 (×3): via TOPICAL
  Filled 2016-11-01: qty 90

## 2016-11-01 MED ORDER — SODIUM CHLORIDE 0.9% FLUSH
3.0000 mL | Freq: Two times a day (BID) | INTRAVENOUS | Status: DC
  Administered 2016-11-01 – 2016-11-06 (×8): 3 mL via INTRAVENOUS

## 2016-11-01 MED ORDER — SUCRALFATE 1 G PO TABS
1.0000 g | ORAL_TABLET | Freq: Four times a day (QID) | ORAL | Status: DC
  Administered 2016-11-01 – 2016-11-07 (×27): 1 g via ORAL
  Filled 2016-11-01 (×26): qty 1

## 2016-11-01 MED ORDER — HYDRALAZINE HCL 20 MG/ML IJ SOLN
10.0000 mg | Freq: Four times a day (QID) | INTRAMUSCULAR | Status: DC | PRN
  Administered 2016-11-01: 10 mg via INTRAVENOUS
  Filled 2016-11-01: qty 0.5

## 2016-11-01 MED ORDER — ACETAMINOPHEN 650 MG RE SUPP: 650.0000 mg | Freq: Four times a day (QID) | RECTAL | Status: DC | PRN

## 2016-11-01 MED ORDER — PANTOPRAZOLE SODIUM 40 MG PO TBEC
80.0000 mg | DELAYED_RELEASE_TABLET | Freq: Two times a day (BID) | ORAL | Status: DC
  Administered 2016-11-01 – 2016-11-07 (×14): 80 mg via ORAL
  Filled 2016-11-01 (×14): qty 2

## 2016-11-01 MED ORDER — MAGNESIUM OXIDE 400 (241.3 MG) MG PO TABS
400.0000 mg | ORAL_TABLET | Freq: Every day | ORAL | Status: DC
  Administered 2016-11-01 – 2016-11-07 (×7): 400 mg via ORAL
  Filled 2016-11-01 (×7): qty 1

## 2016-11-01 MED ORDER — ONDANSETRON HCL 4 MG PO TABS: 4.0000 mg | ORAL_TABLET | Freq: Four times a day (QID) | ORAL | Status: DC | PRN

## 2016-11-01 MED ORDER — ENOXAPARIN SODIUM 40 MG/0.4ML ~~LOC~~ SOLN
40.0000 mg | Freq: Every day | SUBCUTANEOUS | Status: DC
  Administered 2016-11-01 – 2016-11-06 (×7): 40 mg via SUBCUTANEOUS
  Filled 2016-11-01 (×7): qty 0.4

## 2016-11-01 MED ORDER — METOCLOPRAMIDE HCL 10 MG PO TABS
10.0000 mg | ORAL_TABLET | Freq: Four times a day (QID) | ORAL | Status: DC
  Administered 2016-11-01 – 2016-11-07 (×26): 10 mg via ORAL
  Filled 2016-11-01 (×26): qty 1

## 2016-11-01 NOTE — ED Notes (Signed)
Patient stuck x 2 and unsuccessful. Patients port does not draw back blood.

## 2016-11-01 NOTE — ED Notes (Signed)
20 min timer started at 1225 patient was taken up stairs at 1300. Bedside report given to ICU RN.
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
## 2016-11-01 NOTE — Consult Note (Addendum)
[facility] Nurse wound consult note: Pt is familiar to [facility] team from many previous admissions, most recent was on 4/15. Reason for Consult: He has multiple chronic wounds and was being followed by the outpatient wound care center prior to admission.  His sister assists with dressing changes at home and states his right posterior shoulder wound recently declined; with increased necrotic areas, odor, and drainage. Pressure Injury POA: Yes Measurement: It is difficult to obtain accurate measurements since patient has unusual anatomy and landmarks related to a flap surgery long ago. Sacral/Buttocks/Bilateral ischial tuberosity: Approx 25X24cm; There is a deeper area to the right side of the wound which is 2X1X7cm.  Wound is 80% red, 10% exposed bone, 10% yellow.  Mod amt tan drainage, slight odor; surrounded by pink moist scar tissue to wound edges. Patchy areas of partial thickness skin loss to posterior perineum and posterior scrotum; red and moist; appearance is consistent with moisture associated skin damage. Middle of back with previous blister which has ruptured and evolved into a partial thickness wound; 4X4X.1cm, pink and moist with small amt yellow drainage. Right inner heel with 3X3cm dark reddish purple deep tissue injury. Right posterior leg with healing full thickness wound; 1X1X.1cm, pink  Right shoulder full thickness wound measures 8cm x 11cm with 2cm depth; 80% slough/eschar, 20% red, large amt strong odor, mod amt tan drainage, bone palpable; suspect wound tunnels underneath nonviable tissue.  Dressing procedure/placement/frequency: Pt is on a Sport low air loss bed to reduce pressure.  Moist gauze dressings to buttocks, sacrum, ischial tuberosities with saline moistened roll gauze, covering that with dry gauze 4x4s, topping dressings with ABD pads and securing with tape.  Santyl to right posterior shoulder for enzymatic debridement; PT will begin hydrotherapy to assist with sharp debridement of  nonviable tissue. Foam dressing to middle back and posterior leg to promote healing.  Float heels to reduce pressure. Barrier cream to perineum to repel moisture.  [facility] Nurse ostomy consult note Stoma type/location: LLQ colostomy, pt states the pouch was applied yesterday and is intact with good seal. Output: Mod amt brown stool, soft Ostomy pouching: 2pc. 2 and 3/4 inch pouching system. Extra supplies left at the bedside for staff nurse use.  Plan: Pt has been followed in the past by Plastics MD, C. Dillingham; and could benefit from a re-consult to determine further plan of care.  Pt could benefit from an MRI to right posterior shoulder to R/O osteomyelitis, please order if desired.  Please re-consult if further assistance is needed.  Gae Dry MSN, RN, [place], [place], [place]   I

## 2016-11-01 NOTE — Progress Notes (Signed)
RT placed patient on cpap. Patient setting is auto 5-20 cmH2O. Sterile water added to water chamber for humidification. Patient is tolerating well.

## 2016-11-01 NOTE — Progress Notes (Signed)
PROGRESS NOTE  [name] [name] [id number] DOB: Mar 22, 1966 DOA: 10/31/2016 PCP: [name], MD  HPI/Recap of past 24 hours:  Feeling better, talking on the phone  Assessment/Plan: Principal Problem:   Sepsis ([place]) Active Problems:   Quadriplegia ([place])   HTN (hypertension)   Sacral decubitus ulcer, stage IV ([place])   S/P colostomy ([place])   Suprapubic catheter ([place])   OSA on CPAP   Severe protein-calorie malnutrition ([place])   Anemia of chronic disease   Osteomyelitis of thoracic region ([place])  Sepsis from UTI vs infected wound, ( the one on the right shoulder with odor) -Sepsis presented on admission with fever 101, sinus tachycardia heart rate 106, hypotension bp 88/58  -right shoulder wound with odor, suprapubic catheter exit site with scan drainage ( he is due to have suprapubic catheter changes, last changed on 5/16, he report it get changed once a month by home health RN) -he is started on zosyn since admission -Urine culture/blood culture in process, mrsa screening pending, wound culture from right shoulder pending collection, mri right shoulder -infectious disease ,plastic surgery, wound care consulted  Multiple chronic wounds/chronic osteomyelitis: infectious disease, plastic surgery, wound care consulted   Normocytic anemia:  Stable.  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:  -Continue baclofen -Continue vesicare -Continue mag and Docusate -Consult to WOC  OSA:  -Continue CPAP  Other medications:  -Hold PRN furosemide -Continue PPI and Reglan and sucralfate  Code Status: full  Family Communication: patient   Disposition Plan: pending   Consultants:  I talked to  Plastic surgery Dr Marla Roe who states she is going out of town  today, she will have her PA come to eval the patient  Infectious disease Dr Megan Salon will see patient this pm  Wound care   Procedures:  none  Antibiotics:  zosyn   Objective: BP (!) 219/196   Pulse 87   Temp 98.5 F (36.9 C) (Oral)   Resp (!) 23   Ht 6\' 3"  (1.905 m)   Wt 95.3 kg (210 lb)   SpO2 98%   BMI 26.25 kg/m   Intake/Output Summary (Last 24 hours) at 11/01/16 1633 Last data filed at 11/01/16 1348  Gross per 24 hour  Intake             4156 ml  Output              200 ml  Net             3956 ml   Filed Weights   10/31/16 1952  Weight: 95.3 kg (210 lb)    Exam:   General:  NAD, has his headset on, talking on the phone, mediport in place.  Cardiovascular: RRR  Respiratory: CTABL  Abdomen: + colostomy, Soft/ND/NT, positive BS, + suprapubic catheter with clear urine a few whitish sediment , suprapubic catheter exit site with scant leakage  Musculoskeletal: chronic contractures  Neuro: aaox3  Skin: multiple chronic wounds  Data Reviewed: Basic Metabolic Panel:  Recent Labs Lab 10/31/16 2122 11/01/16 1359  NA 139 141  K 4.0 3.5  CL 107 111  CO2 24 23  GLUCOSE 136* 114*  BUN 23* 21*  CREATININE 0.71 0.47*  CALCIUM 8.9 8.5*   Liver Function Tests:  Recent Labs Lab 10/31/16 2122  AST 25  ALT 22  ALKPHOS 77  BILITOT <0.1*  PROT 7.6  ALBUMIN 2.8*   No results for input(s): LIPASE, AMYLASE in the last 168 hours. No results for input(s): AMMONIA in the last 168 hours. CBC:  Recent Labs Lab 10/28/16 1515 10/31/16 2122 11/01/16 1359  WBC 14.0* 13.7* 8.9  NEUTROABS 9.0* 11.3*  --   HGB 10.7* 9.4* 8.6*  HCT 35.8* 30.2* 28.0*  MCV 83.6 79.9 81.2  PLT 275 412* 347   Cardiac Enzymes:   No results for input(s): CKTOTAL, CKMB, CKMBINDEX, TROPONINI in the last 168 hours. BNP (last 3 results) No results for input(s): BNP in the last 8760 hours.  ProBNP (last 3 results) No results for input(s): PROBNP in the last 8760  hours.  CBG: No results for input(s): GLUCAP in the last 168 hours.  Recent Results (from the past 240 hour(s))  Culture, blood (Routine x 2)     Status: None (Preliminary result)   Collection Time: 10/31/16  9:22 PM  Result Value Ref Range Status   Specimen Description BLOOD LEFT ARM  Final   Special Requests   Final    BOTTLES DRAWN AEROBIC AND ANAEROBIC Blood Culture adequate volume   Culture   Final    NO GROWTH < 24 HOURS Performed at [facility], [address], [place], [place] [address]    Report Status PENDING  Incomplete  Wound or Superficial Culture     Status: None (Preliminary result)   Collection Time: 10/31/16  9:22 PM  Result Value Ref Range Status   Specimen Description BACK  Final   Special Requests NONE  Final   Gram Stain   Final    FEW WBC PRESENT, PREDOMINANTLY PMN MODERATE GRAM POSITIVE COCCI IN PAIRS RARE GRAM NEGATIVE RODS    Culture   Final    CULTURE REINCUBATED FOR BETTER GROWTH Performed at [facility], [place] [address], [place],  [address]    Report Status PENDING  Incomplete     Studies: Dg Chest Port 1 View  Result Date: 10/31/2016 CLINICAL DATA:  Initial evaluation for acute sepsis. EXAM: PORTABLE CHEST 1 VIEW COMPARISON:  Prior radiograph from 08/28/2015. FINDINGS: Right-sided Port-A-Cath in place. Stable cardiomegaly. Mediastinal silhouette normal. Lungs normally inflated. No focal infiltrates. No pulmonary edema or pleural effusion. No pneumothorax. No acute osseus abnormality. Expansile right fifth rib mass, stable from previous. Remotely healed left-sided rib fractures noted. Scoliosis noted. Postsurgical changes noted at the cervical spine. IMPRESSION: 1. No radiographic evidence for active cardiopulmonary disease. 2. Stable expansile right fifth rib mass. Electronically Signed   By: Jeannine Boga M.D.   On: 10/31/2016 21:58    Scheduled Meds: . baclofen  20 mg Oral QID  . collagenase   Topical Daily  .  darifenacin  15 mg Oral Daily  . docusate sodium  200 mg Oral BID  . enoxaparin (LOVENOX) injection  40 mg Subcutaneous QHS  . ferrous sulfate  325 mg Oral TID WC  . hydrocerin  1 application Topical Daily  . magnesium oxide  400 mg Oral Daily  . metoCLOPramide  10 mg Oral Q6H  . nutrition supplement (JUVEN)  1 packet Oral BID BM  . pantoprazole  80 mg Oral BID AC  . sodium chloride flush  3 mL Intravenous Q12H  . sucralfate  1 g Oral QID    Continuous Infusions: . piperacillin-tazobactam (ZOSYN)  IV 3.375 g (11/01/16 1348)     Time spent: 33mins  Marycarmen Hagey MD, PhD  [facility] Pager [phone]. If 7PM-7AM, please contact night-coverage at [email or web address], password [facility] 11/01/2016, 4:33 PM  LOS: 1 day

## 2016-11-01 NOTE — ED Notes (Signed)
Called phlebotomy in lab to collect blood on patient for morning labs

## 2016-11-01 NOTE — Progress Notes (Signed)
Pharmacy Antibiotic Note  [name] [name] is a 51 y.o. male admitted on 10/31/2016 with sepsis.  Pharmacy has been consulted for zosyn dosing.  Plan: Zosyn 3.375g IV q8h (4 hour infusion).  Height: 6\' 3"  (190.5 cm) Weight: 210 lb (95.3 kg) IBW/kg (Calculated) : 84.5  Temp (24hrs), Avg:101.1 F (38.4 C), Min:101.1 F (38.4 C), Max:101.1 F (38.4 C)   Recent Labs Lab 10/28/16 1515 10/31/16 2122 10/31/16 2140 11/01/16 0253  WBC 14.0* 13.7*  --   --   CREATININE  --  0.71  --   --   LATICACIDVEN  --   --  2.23* 1.7    Estimated Creatinine Clearance: 132 mL/min (by C-G formula based on SCr of 0.71 mg/dL).    Allergies  Allergen Reactions  . Feraheme [Ferumoxytol] Other (See Comments)    SYNCOPE  . Ditropan [Oxybutynin] Other (See Comments)    Hallucinations   . Vancomycin Other (See Comments)    ARF 05-2016    Antimicrobials this admission:  Zosyn 11/01/2016 >>    Dose adjustments this admission: -  Microbiology results: pending  Thank you for allowing pharmacy to be a part of this patient's care.  Nani Skillern [place] 11/01/2016 5:08 AM

## 2016-11-01 NOTE — ED Notes (Signed)
This writer attempted to collect morning labs unsuccessful

## 2016-11-02 ENCOUNTER — Encounter (HOSPITAL_COMMUNITY): Payer: Self-pay | Admitting: Physician Assistant

## 2016-11-02 DIAGNOSIS — L8990 Pressure ulcer of unspecified site, unspecified stage: Secondary | ICD-10-CM

## 2016-11-02 DIAGNOSIS — L089 Local infection of the skin and subcutaneous tissue, unspecified: Secondary | ICD-10-CM

## 2016-11-02 DIAGNOSIS — T148XXA Other injury of unspecified body region, initial encounter: Secondary | ICD-10-CM

## 2016-11-02 LAB — CBC WITH DIFFERENTIAL/PLATELET
BASOS ABS: 0 10*3/uL (ref 0.0–0.1)
Basophils Relative: 0 %
Eosinophils Absolute: 0.7 10*3/uL (ref 0.0–0.7)
Eosinophils Relative: 8 %
HCT: 27.9 % — ABNORMAL LOW (ref 39.0–52.0)
Hemoglobin: 8.7 g/dL — ABNORMAL LOW (ref 13.0–17.0)
LYMPHS ABS: 1.2 10*3/uL (ref 0.7–4.0)
Lymphocytes Relative: 14 %
MCH: 24.9 pg — ABNORMAL LOW (ref 26.0–34.0)
MCHC: 31.2 g/dL (ref 30.0–36.0)
MCV: 79.7 fL (ref 78.0–100.0)
MONOS PCT: 16 %
Monocytes Absolute: 1.3 10*3/uL — ABNORMAL HIGH (ref 0.1–1.0)
NEUTROS PCT: 62 %
Neutro Abs: 5.1 10*3/uL (ref 1.7–7.7)
PLATELETS: 271 10*3/uL (ref 150–400)
RBC: 3.5 MIL/uL — AB (ref 4.22–5.81)
RDW: 15.9 % — ABNORMAL HIGH (ref 11.5–15.5)
WBC: 8.3 10*3/uL (ref 4.0–10.5)

## 2016-11-02 LAB — BASIC METABOLIC PANEL
ANION GAP: 8 (ref 5–15)
BUN: 19 mg/dL (ref 6–20)
CO2: 22 mmol/L (ref 22–32)
Calcium: 8.6 mg/dL — ABNORMAL LOW (ref 8.9–10.3)
Chloride: 111 mmol/L (ref 101–111)
Creatinine, Ser: 0.5 mg/dL — ABNORMAL LOW (ref 0.61–1.24)
GFR calc Af Amer: >60 mL/min (ref >60)
Glucose, Bld: 106 mg/dL — ABNORMAL HIGH (ref 65–99)
POTASSIUM: 3.4 mmol/L — AB (ref 3.5–5.1)
SODIUM: 141 mmol/L (ref 135–145)

## 2016-11-02 LAB — URINE CULTURE

## 2016-11-02 LAB — MAGNESIUM: MAGNESIUM: 1.3 mg/dL — AB (ref 1.7–2.4)

## 2016-11-02 MED ORDER — CHLORHEXIDINE GLUCONATE CLOTH 2 % EX PADS
6.0000 | MEDICATED_PAD | Freq: Every day | CUTANEOUS | Status: AC
  Administered 2016-11-02 – 2016-11-06 (×3): 6 via TOPICAL

## 2016-11-02 MED ORDER — ALTEPLASE 2 MG IJ SOLR
2.0000 mg | Freq: Once | INTRAMUSCULAR | Status: AC
  Administered 2016-11-02: 2 mg
  Filled 2016-11-02: qty 2

## 2016-11-02 MED ORDER — POTASSIUM CHLORIDE CRYS ER 20 MEQ PO TBCR
40.0000 meq | EXTENDED_RELEASE_TABLET | Freq: Once | ORAL | Status: AC
  Administered 2016-11-02: 40 meq via ORAL
  Filled 2016-11-02: qty 2

## 2016-11-02 MED ORDER — LIDOCAINE HCL 2 % IJ SOLN
0.0000 mL | Freq: Once | INTRAMUSCULAR | Status: DC | PRN
  Filled 2016-11-02: qty 20

## 2016-11-02 MED ORDER — DAKINS (1/4 STRENGTH) 0.125 % EX SOLN
Freq: Two times a day (BID) | CUTANEOUS | Status: AC
  Administered 2016-11-03 – 2016-11-06 (×3)
  Filled 2016-11-02: qty 473

## 2016-11-02 MED ORDER — MUPIROCIN 2 % EX OINT
1.0000 "application " | TOPICAL_OINTMENT | Freq: Two times a day (BID) | CUTANEOUS | Status: AC
  Administered 2016-11-02 – 2016-11-06 (×10): 1 via NASAL
  Filled 2016-11-02: qty 22

## 2016-11-02 MED ORDER — DAKINS (1/4 STRENGTH) 0.125 % EX SOLN
Freq: Two times a day (BID) | CUTANEOUS | Status: AC
  Administered 2016-11-03 – 2016-11-04 (×3): via TOPICAL
  Filled 2016-11-02 (×2): qty 473

## 2016-11-02 MED ORDER — SODIUM CHLORIDE 0.9% FLUSH
10.0000 mL | INTRAVENOUS | Status: DC | PRN
  Administered 2016-11-05: 20 mL
  Filled 2016-11-02: qty 40

## 2016-11-02 MED ORDER — CHLORHEXIDINE GLUCONATE CLOTH 2 % EX PADS
6.0000 | MEDICATED_PAD | Freq: Every day | CUTANEOUS | Status: DC
  Administered 2016-11-02 – 2016-11-07 (×6): 6 via TOPICAL

## 2016-11-02 MED ORDER — ADULT MULTIVITAMIN W/MINERALS CH
1.0000 | ORAL_TABLET | Freq: Every day | ORAL | Status: DC
  Administered 2016-11-02 – 2016-11-07 (×6): 1 via ORAL
  Filled 2016-11-02 (×5): qty 1

## 2016-11-02 MED ORDER — DAKINS (1/4 STRENGTH) 0.125 % EX SOLN
Freq: Two times a day (BID) | CUTANEOUS | Status: DC
  Filled 2016-11-02: qty 473

## 2016-11-02 MED ORDER — PREMIER PROTEIN SHAKE
11.0000 [oz_av] | Freq: Two times a day (BID) | ORAL | Status: DC
  Administered 2016-11-03 – 2016-11-07 (×10): 11 [oz_av] via ORAL
  Filled 2016-11-02 (×12): qty 325.31

## 2016-11-02 NOTE — Procedures (Signed)
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  Necrotic tissue was debrided with a #11 blade was performed on the right trunk/shoulder area. Drainage: present, but minimal. Deeper tissue was sent for aerobic and anaerobic cultures. 7 mm curette was also used to debride necrotic tissue to pink, bleeding tissue. Pressure was applied following the debridement and hemostasis was obtained.  The wound was then packed with NS gauze wet to dry and ABD pad.    EBL: <2 cc's  Condition: Tolerated procedure well and Stable  Complications: none.  RAYBURN,SHAWN,PA-C Plastic Surgery [phone]
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
## 2016-11-02 NOTE — Progress Notes (Signed)
PT Cancellation Note  Patient Details Name: USAMA HARKLESS MRN: [id number] DOB: 03/11/1966   Cancelled Treatment:    Reason Eval/Treat Not Completed: Plastic surgeon has ordered Dakin's solution to be used for a few day. Waiting for Dakin's from pharmacy. Will check back if schedule allows.    Weston Anna, MPT Pager: [phone]
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
## 2016-11-02 NOTE — Progress Notes (Addendum)
HYDROTHERAPY EVALUATION      11/02/16 1700  Subjective Assessment  Subjective "okay"  Patient and Family Stated Goals for wounds to heal  Date of Onset (unknown. POA)  Prior Treatments wound care center  Evaluation and Treatment  Evaluation and Treatment Procedures Explained to Patient/Family Yes  Evaluation and Treatment Procedures agreed to  Wound / Incision (Open or Dehisced) 11/02/16 Location: Back;Right/Upper Full Thickness. *PT*  Date First Assessed/Time First Assessed: 11/02/16 1600   Wound Type: Other (Comment)  Location: Back  Location Orientation: Right;Upper  Wound Description (Comments): Full Thickness. *PT*  Present on Admission: Yes  Dressing Type ABD;Gauze (Comment)  Dressing Changed Changed  Dressing Status Old drainage  Dressing Change Frequency Twice a day  Site / Wound Assessment Yellow;Pink;Black;Brown  % Wound base Red or Granulating 25%  % Wound base Yellow/Fibrinous Exudate 70%  % Wound base Black/Eschar 5%  Peri-wound Assessment Excoriated (small area inferior to open wound)  Wound Length (cm) 8 cm  Wound Width (cm) 11 cm  Wound Depth (cm) 2 cm  Undermining (cm) 2 (10:00-1:00)  Margins Unattached edges (unapproximated)  Drainage Amount Moderate  Drainage Description Serosanguineous  Non-staged Wound Description Full thickness  Treatment Debridement (Selective);Hydrotherapy (Pulse lavage);Packing (Saline gauze). Plastic Surgeon recommended Dakin's solution for "a few days". Dakin's not available in room at time of hydro eval.   Hydrotherapy  Pulsed lavage therapy - wound location Right upper posterior trunk  Pulsed Lavage with Suction (psi) 8 psi  Pulsed Lavage with Suction - Normal Saline Used 1000 mL  Pulsed Lavage Tip Tip with splash shield  Selective Debridement  Selective Debridement - Location Right Upper Posterior Trunk  Selective Debridement - Tools Used Forceps;Scissors  Selective Debridement - Tissue Removed yellow/brown tissue  Wound  Therapy - Assess/Plan/Recommendations  Wound Therapy - Clinical Statement 51 yo male with history of C5 quadriplegia and multiple wounds. Pt is from home where he lives with his sister. [place] has been managing wounds  Wound Therapy - Functional Problem List Immobility  Factors Delaying/Impairing Wound Healing Incontinence;Infection - systemic/local;Immobility;Altered sensation  Hydrotherapy Plan Debridement;Dressing change;Patient/family education;Pulsatile lavage with suction  Wound Therapy - Frequency 6X / week  Wound Therapy - Current Recommendations Case manager/social work;Surgery consult;WOC nurse  Wound Therapy - Follow Up Recommendations Home health RN;Skilled nursing facility;[place]  Wound Plan Hydrotherapy and dressing changes to facilitate wound healing  Wound Therapy Goals - Improve the function of patient's integumentary system by progressing the wound(s) through the phases of wound healing by:  Decrease Necrotic Tissue to 40  Decrease Necrotic Tissue - Progress Goal set today  Increase Granulation Tissue to 60  Increase Granulation Tissue - Progress Goal set today  Improve Drainage Characteristics Min  Improve Drainage Characteristics - Progress Goal set today  Goals/treatment plan/discharge plan were made with and agreed upon by patient/family Yes  Time For Goal Achievement 2 weeks  Wound Therapy - Potential for Goals Fair   Weston Anna, MPT [phone]

## 2016-11-02 NOTE — Care Management Note (Signed)
Case Management Note  Patient Details  Name: [name] [name] MRN: [id number] Date of Birth: 04/11/1966  Subjective/Objective:           sepsis         Action/Plan:  Date:  November 02, 2016 Chart reviewed for concurrent status and case management needs. Will continue to follow patient progress. Discharge Planning: following for needs Expected discharge date: [id number] Velva Harman, BSN, [place], [place]  Expected Discharge Date:   (unknown)               Expected Discharge Plan:  Home/Self Care  In-House Referral:     Discharge planning Services  CM Consult  Post Acute Care Choice:    Choice offered to:     DME Arranged:    DME Agency:     HH Arranged:    [place] Agency:     Status of Service:  In process, will continue to follow  If discussed at Long Length of Stay Meetings, dates discussed:    Additional Comments:  Leeroy Cha, RN 11/02/2016, 9:02 AM

## 2016-11-02 NOTE — Progress Notes (Signed)
Initial Nutrition Assessment  DOCUMENTATION CODES:   Not applicable  INTERVENTION:   Juven Fruit Punch BID, each serving provides 80kcal and 14g of protein (amino acids glutamine and arginine)  Premier Protein BID, each supplement provides 160kcal and 30g protein.   MVI  NUTRITION DIAGNOSIS:   Increased nutrient needs related to wound healing as evidenced by increased estimated needs from protein.  GOAL:   Patient will meet greater than or equal to 90% of their needs  MONITOR:   PO intake, Supplement acceptance, Labs, Weight trends, Skin  REASON FOR ASSESSMENT:   Low Braden    ASSESSMENT:   51 y.o. male admitted with fever and recent worsening of his right shoulder wound and UTI. He was treated for reactive osteomyelitis in January.   Met with pt in room today. Pt reports good appetite today and pta. Pt is currently eating 100% of meals. Pt takes Juven at home because this is the supplement his insurance would cover. Pt would like to try Premier Protein while in hospital. Pt also takes daily multivitamin at home; RD will order. Pt with multiple wounds. Pt also reports intentional 40lb weight loss over the past two years. Pt with hypokalemia; monitor and supplement as needed per MD discretion.    Medications reviewed and include: colace, lovenox, ferrous sulfate, Mg Oxide, reglan, protonix, carafate  Labs reviewed: K 3.4(L), creat 0.5(L), Ca 8.6(L), Mg 1.3(L) Hgb 8.7(L), Hct 27.9(L)  Nutrition-Focused physical exam completed. Findings are no fat depletion, no muscle depletion, and moderate edema.   Diet Order:  Diet regular Room service appropriate? Yes; Fluid consistency: Thin  Skin:  Wound (see comment) (sacrum, back, heel, leg, shoulder)  Last BM:  none since admit   Height:   Ht Readings from Last 1 Encounters:  10/31/16 6' 3"  (1.905 m)    Weight:   Wt Readings from Last 1 Encounters:  10/31/16 210 lb (95.3 kg)    Ideal Body Weight:  80 kg (adjusted  for quadraplegia )  BMI:  Body mass index is 26.25 kg/m.  Estimated Nutritional Needs:   Kcal:  2300-2600kcal/day   Protein:  124-143g/day   Fluid:  >2L/day   EDUCATION NEEDS:   Education needs addressed  Koleen Distance MS, RD, LDN Pager #[phone]

## 2016-11-03 LAB — AEROBIC CULTURE  (SUPERFICIAL SPECIMEN)

## 2016-11-03 LAB — CBC
HCT: 26 % — ABNORMAL LOW (ref 39.0–52.0)
HEMOGLOBIN: 7.9 g/dL — AB (ref 13.0–17.0)
MCH: 24.6 pg — ABNORMAL LOW (ref 26.0–34.0)
MCHC: 30.4 g/dL (ref 30.0–36.0)
MCV: 81 fL (ref 78.0–100.0)
Platelets: 344 10*3/uL (ref 150–400)
RBC: 3.21 MIL/uL — AB (ref 4.22–5.81)
RDW: 16.1 % — ABNORMAL HIGH (ref 11.5–15.5)
WBC: 8.5 10*3/uL (ref 4.0–10.5)

## 2016-11-03 LAB — AEROBIC CULTURE W GRAM STAIN (SUPERFICIAL SPECIMEN)

## 2016-11-03 LAB — BASIC METABOLIC PANEL
ANION GAP: 4 — AB (ref 5–15)
BUN: 14 mg/dL (ref 6–20)
CHLORIDE: 113 mmol/L — AB (ref 101–111)
CO2: 25 mmol/L (ref 22–32)
CREATININE: 0.49 mg/dL — AB (ref 0.61–1.24)
Calcium: 8.4 mg/dL — ABNORMAL LOW (ref 8.9–10.3)
GFR calc non Af Amer: >60 mL/min (ref >60)
Glucose, Bld: 125 mg/dL — ABNORMAL HIGH (ref 65–99)
POTASSIUM: 3.5 mmol/L (ref 3.5–5.1)
SODIUM: 142 mmol/L (ref 135–145)
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
## 2016-11-03 NOTE — Progress Notes (Signed)
HYDROTHERAPY TREATMENT     11/03/16 1400  Subjective Assessment  Subjective "okay"  Patient and Family Stated Goals for wounds to heal  Date of Onset (unknown)  Prior Treatments wound care center  Evaluation and Treatment  Evaluation and Treatment Procedures Explained to Patient/Family Yes  Evaluation and Treatment Procedures agreed to  Wound / Incision (Open or Dehisced) 11/02/16 Other (Comment) Back Right;Upper Full Thickness. *PT*  Date First Assessed/Time First Assessed: 11/02/16 1600   Wound Type: Other (Comment)  Location: Back  Location Orientation: Right;Upper  Wound Description (Comments): Full Thickness.   Present on Admission: Yes  *PT*  Dressing Type ABD;Gauze;Moist to moist Dakin's (day1)  Dressing Changed Changed  Dressing Status Old drainage  Dressing Change Frequency Daily  Site / Wound Assessment Pink;Brown;Black;Yellow  % Wound base Red or Granulating 35%  % Wound base Yellow/Fibrinous Exudate 60%  % Wound base Black/Eschar 5%  Margins Unattached edges (unapproximated)  Drainage Amount Moderate  Drainage Description Odor;Serosanguineous  Non-staged Wound Description Full thickness  Treatment Debridement (Selective);Hydrotherapy (Pulse lavage) Dakin's soaked gauze (day1)  Hydrotherapy  Pulsed lavage therapy - wound location Right upper posterior trunk  Pulsed Lavage with Suction (psi) 8 psi  Pulsed Lavage with Suction - Normal Saline Used 1000 mL  Pulsed Lavage Tip Tip with splash shield  Wound Therapy - Assess/Plan/Recommendations  Wound Therapy - Clinical Statement 51 yo male with history of C5 quadriplegia and multiple wounds. Pt is from home where he lives with his sister. [name] has been managing wounds  Wound Therapy - Functional Problem List Immobility  Factors Delaying/Impairing Wound Healing Incontinence;Infection - systemic/local;Immobility;Altered sensation  Hydrotherapy Plan Debridement;Dressing change;Patient/family education;Pulsatile  lavage with suction  Wound Therapy - Frequency 6X / week  Wound Therapy - Current Recommendations Case manager/social work;Surgery consult;WOC nurse  Wound Therapy - Follow Up Recommendations Home health RN;Skilled nursing facility;[facility] and dressing changes to facilitate wound healing. Per Plastic Surgeon will plan to use "Dakin's for 5 days"  Wound Therapy Goals - Improve the function of patient's integumentary system by progressing the wound(s) through the phases of wound healing by:  Decrease Necrotic Tissue to 40  Decrease Necrotic Tissue - Progress Progressing toward goal  Increase Granulation Tissue to 60  Increase Granulation Tissue - Progress Progressing toward goal  Improve Drainage Characteristics Min  Improve Drainage Characteristics - Progress Progressing toward goal  Goals/treatment plan/discharge plan were made with and agreed upon by patient/family Yes  Time For Goal Achievement 2 weeks  Wound Therapy - Potential for Goals Doylene Canard, MPT [phone]
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
## 2016-11-03 NOTE — Progress Notes (Signed)
Patient transferred to room 1520 via bed. Report given to patients RN.

## 2016-11-04 DIAGNOSIS — D638 Anemia in other chronic diseases classified elsewhere: Secondary | ICD-10-CM

## 2016-11-04 DIAGNOSIS — L89112 Pressure ulcer of right upper back, stage 2: Secondary | ICD-10-CM

## 2016-11-04 DIAGNOSIS — M4624 Osteomyelitis of vertebra, thoracic region: Secondary | ICD-10-CM

## 2016-11-04 DIAGNOSIS — L89000 Pressure ulcer of unspecified elbow, unstageable: Secondary | ICD-10-CM

## 2016-11-04 LAB — AEROBIC CULTURE W GRAM STAIN (SUPERFICIAL SPECIMEN)

## 2016-11-04 LAB — CBC
HEMATOCRIT: 24.2 % — AB (ref 39.0–52.0)
HEMOGLOBIN: 7.5 g/dL — AB (ref 13.0–17.0)
MCH: 25.5 pg — AB (ref 26.0–34.0)
MCHC: 31 g/dL (ref 30.0–36.0)
MCV: 82.3 fL (ref 78.0–100.0)
Platelets: 304 10*3/uL (ref 150–400)
RBC: 2.94 MIL/uL — ABNORMAL LOW (ref 4.22–5.81)
RDW: 16.3 % — ABNORMAL HIGH (ref 11.5–15.5)
WBC: 9.2 10*3/uL (ref 4.0–10.5)

## 2016-11-04 LAB — BASIC METABOLIC PANEL
Anion gap: 7 (ref 5–15)
BUN: 13 mg/dL (ref 6–20)
CALCIUM: 8.3 mg/dL — AB (ref 8.9–10.3)
CHLORIDE: 109 mmol/L (ref 101–111)
CO2: 24 mmol/L (ref 22–32)
CREATININE: 0.47 mg/dL — AB (ref 0.61–1.24)
GFR calc Af Amer: >60 mL/min (ref >60)
GFR calc non Af Amer: >60 mL/min (ref >60)
Glucose, Bld: 105 mg/dL — ABNORMAL HIGH (ref 65–99)
Potassium: 2.9 mmol/L — ABNORMAL LOW (ref 3.5–5.1)
Sodium: 140 mmol/L (ref 135–145)

## 2016-11-04 LAB — AEROBIC CULTURE  (SUPERFICIAL SPECIMEN)

## 2016-11-04 LAB — MAGNESIUM: MAGNESIUM: 1.4 mg/dL — AB (ref 1.7–2.4)

## 2016-11-04 MED ORDER — POTASSIUM CHLORIDE 10 MEQ/100ML IV SOLN
10.0000 meq | INTRAVENOUS | Status: AC
  Administered 2016-11-04 (×4): 10 meq via INTRAVENOUS
  Filled 2016-11-04 (×4): qty 100

## 2016-11-04 MED ORDER — POTASSIUM CHLORIDE CRYS ER 20 MEQ PO TBCR
40.0000 meq | EXTENDED_RELEASE_TABLET | Freq: Four times a day (QID) | ORAL | Status: AC
  Administered 2016-11-04 (×2): 40 meq via ORAL
  Filled 2016-11-04 (×2): qty 2

## 2016-11-04 MED ORDER — MAGNESIUM SULFATE 2 GM/50ML IV SOLN
2.0000 g | Freq: Once | INTRAVENOUS | Status: AC
  Administered 2016-11-04: 2 g via INTRAVENOUS
  Filled 2016-11-04: qty 50
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
## 2016-11-04 NOTE — Progress Notes (Signed)
Date: November 04, 2016 Chart reviewed for discharge orders: [name] notified of need for iv abx at home. Vernia Buff, [phone]

## 2016-11-04 NOTE — Progress Notes (Signed)
HYDROTHERAPY TREATMENT      11/04/16 1200  Subjective Assessment  Subjective "okay"  Patient and Family Stated Goals for wounds to heal  Prior Treatments wound care center  Evaluation and Treatment  Evaluation and Treatment Procedures Explained to Patient/Family Yes  Evaluation and Treatment Procedures agreed to  Wound / Incision (Open or Dehisced) 11/02/16 Other (Comment) Back Right;Upper Full Thickness. *PT*  Date First Assessed/Time First Assessed: 11/02/16 1600   Wound Type: Other (Comment)  Location: Back  Location Orientation: Right;Upper  Wound Description (Comments): Full Thickness. *PT*  Present on Admission: Yes  Dressing Type ABD;Gauze ;Moist to dry Dakin's (day 2)  Dressing Changed Changed  Dressing Status Old drainage  Dressing Change Frequency Daily  Site / Wound Assessment Black;Brown;Pink;Yellow  % Wound base Red or Granulating 35%  % Wound base Yellow/Fibrinous Exudate 60%  % Wound base Black/Eschar 5%  Margins Unattached edges (unapproximated)  Drainage Amount Moderate  Drainage Description Odor;Serosanguineous  Non-staged Wound Description Full thickness  Treatment Debridement (Selective);Hydrotherapy (Pulse lavage) (Dakin's soaked gauze)  Hydrotherapy  Pulsed lavage therapy - wound location Right upper posterior trunk  Pulsed Lavage with Suction (psi) 8 psi  Pulsed Lavage with Suction - Normal Saline Used 1000 mL  Pulsed Lavage Tip Tip with splash shield  Wound Therapy - Assess/Plan/Recommendations  Wound Therapy - Clinical Statement 51 yo male with history of C5 quadriplegia and multiple wounds. Pt is from home where he lives with his sister. [place] has been managing wounds  Wound Therapy - Functional Problem List Immobility  Factors Delaying/Impairing Wound Healing Incontinence;Infection - systemic/local;Immobility;Altered sensation  Hydrotherapy Plan Debridement;Dressing change;Patient/family education;Pulsatile lavage with suction  Wound  Therapy - Frequency 6X / week  Wound Therapy - Current Recommendations Case manager/social work;Surgery consult;WOC nurse  Wound Therapy - Follow Up Recommendations Home health RN;Skilled nursing facility;[place] and dressing changes to facilitate wound healing. Per Plastic Surgeon will plan to use "Dakin's for 5 days"  Wound Therapy Goals - Improve the function of patient's integumentary system by progressing the wound(s) through the phases of wound healing by:  Decrease Necrotic Tissue to 40  Decrease Necrotic Tissue - Progress Progressing toward goal  Increase Granulation Tissue to 60  Increase Granulation Tissue - Progress Progressing toward goal  Improve Drainage Characteristics Min  Improve Drainage Characteristics - Progress Progressing toward goal  Goals/treatment plan/discharge plan were made with and agreed upon by patient/family Yes  Time For Goal Achievement 2 weeks  Wound Therapy - Potential for Goals Fair   Weston Anna, MPT [phone]

## 2016-11-04 NOTE — Care Management Important Message (Signed)
Important Message  Patient Details IM Letter given to Rhonda/Case Manager to present to Patient Name: BRECKEN DEWOODY MRN: [id number] Date of Birth: [birth year]   Medicare Important Message Given:  Yes    Kerin Salen 11/04/2016, 10:00 AMImportant Message  Patient Details  Name: GEOFF DACANAY MRN: [id number] Date of Birth: [birth year]   Medicare Important Message Given:  Yes    Kerin Salen 11/04/2016, 9:59 AM

## 2016-11-04 NOTE — Progress Notes (Signed)
Patient resting in bed. Assessment unchanged from previous RN. Will continue to monitor.  

## 2016-11-04 NOTE — Progress Notes (Signed)
Pharmacy Antibiotic Note  [name] [name] is a 51 y.o. male admitted on 10/31/2016 with sepsis, recurrent right upper back decubitus wound.  Pharmacy has been consulted for zosyn dosing.  Plan:  Zosyn 3.375g IV Q8H infused over 4hrs.  Per ID, continue Zosyn as outpatient through 12/07/16  Outpatient parenteral antimicrobial therapy (OPAT) orders placed in discharge navigator (see OPAT note for details)  Pharmacy to s/o note writing, but will f/u peripherally.  Height: 6\' 3"  (190.5 cm) Weight: 210 lb (95.3 kg) IBW/kg (Calculated) : 84.5  Temp (24hrs), Avg:98.6 F (37 C), Min:98.4 F (36.9 C), Max:98.8 F (37.1 C)   Recent Labs Lab 10/31/16 2122 10/31/16 2140 11/01/16 0253 11/01/16 1359 11/02/16 0409 11/03/16 0450 11/04/16 0427  WBC 13.7*  --   --  8.9 8.3 8.5 9.2  CREATININE 0.71  --   --  0.47* 0.50* 0.49* 0.47*  LATICACIDVEN  --  2.23* 1.7 1.7  --   --   --     Estimated Creatinine Clearance: 132 mL/min (A) (by C-G formula based on SCr of 0.47 mg/dL (L)).    Allergies  Allergen Reactions  . Feraheme [Ferumoxytol] Other (See Comments)    SYNCOPE  . Ditropan [Oxybutynin] Other (See Comments)    Hallucinations   . Vancomycin Other (See Comments)    ARF 05-2016    Antimicrobials this admission: Zosyn 11/04/2016 >>    Dose adjustments this admission: -  Microbiology results: 6/18 back wound: multiple - none predominant 6/19 wound right shoulder: multiple - none predominant 6/18 blood x 2: ngtd 6/18 urine: multiple species - suggest recollect 6/19 MRSA PCR: positive 6/20 back wound: multiple - none predominant  Thank you for allowing pharmacy to be a part of this patient's care.  Gretta Arab PharmD, BCPS Pager [phone] 11/04/2016 11:44 AM

## 2016-11-04 NOTE — Progress Notes (Signed)
PHARMACY CONSULT NOTE FOR:  OUTPATIENT  PARENTERAL ANTIBIOTIC THERAPY (OPAT)  Indication: Infected right upper back decubitus wound Regimen: Zosyn 4.5 g IV Q6H infused over 30 min End date: 12/07/16  IV antibiotic discharge orders are pended. To discharging provider:  please sign these orders via discharge navigator,  Select New Orders & click on the button choice - Manage This Unsigned Work.     Thank you for allowing pharmacy to be a part of this patient's care.  Gretta Arab PharmD, BCPS Pager [phone] 11/04/2016 11:28 AM

## 2016-11-04 NOTE — Progress Notes (Signed)
Pt. Placed on CPAP for h/s, RT to monitor.

## 2016-11-05 DIAGNOSIS — Z9989 Dependence on other enabling machines and devices: Secondary | ICD-10-CM

## 2016-11-05 DIAGNOSIS — I10 Essential (primary) hypertension: Secondary | ICD-10-CM

## 2016-11-05 DIAGNOSIS — G4733 Obstructive sleep apnea (adult) (pediatric): Secondary | ICD-10-CM

## 2016-11-05 DIAGNOSIS — G8250 Quadriplegia, unspecified: Secondary | ICD-10-CM

## 2016-11-05 DIAGNOSIS — L89119 Pressure ulcer of right upper back, unspecified stage: Secondary | ICD-10-CM

## 2016-11-05 LAB — BASIC METABOLIC PANEL
ANION GAP: 5 (ref 5–15)
BUN: 13 mg/dL (ref 6–20)
CALCIUM: 8.6 mg/dL — AB (ref 8.9–10.3)
CO2: 26 mmol/L (ref 22–32)
Chloride: 111 mmol/L (ref 101–111)
Creatinine, Ser: 0.48 mg/dL — ABNORMAL LOW (ref 0.61–1.24)
GFR calc Af Amer: >60 mL/min (ref >60)
GFR calc non Af Amer: >60 mL/min (ref >60)
GLUCOSE: 106 mg/dL — AB (ref 65–99)
Potassium: 4.2 mmol/L (ref 3.5–5.1)
Sodium: 142 mmol/L (ref 135–145)

## 2016-11-05 LAB — CULTURE, BLOOD (ROUTINE X 2)
CULTURE: NO GROWTH
Special Requests: ADEQUATE

## 2016-11-05 LAB — CBC
HCT: 25.2 % — ABNORMAL LOW (ref 39.0–52.0)
HEMOGLOBIN: 7.7 g/dL — AB (ref 13.0–17.0)
MCH: 25.1 pg — AB (ref 26.0–34.0)
MCHC: 30.6 g/dL (ref 30.0–36.0)
MCV: 82.1 fL (ref 78.0–100.0)
Platelets: 364 10*3/uL (ref 150–400)
RBC: 3.07 MIL/uL — ABNORMAL LOW (ref 4.22–5.81)
RDW: 16.3 % — AB (ref 11.5–15.5)
WBC: 7.5 10*3/uL (ref 4.0–10.5)

## 2016-11-05 LAB — MAGNESIUM: Magnesium: 1.8 mg/dL (ref 1.7–2.4)

## 2016-11-05 MED ORDER — MUPIROCIN 2 % EX OINT: 1.0000 "application " | TOPICAL_OINTMENT | Freq: Two times a day (BID) | CUTANEOUS | 0 refills | Status: AC

## 2016-11-05 MED ORDER — PIPERACILLIN-TAZOBACTAM IV (FOR PTA / DISCHARGE USE ONLY): 4.5000 g | Freq: Four times a day (QID) | INTRAVENOUS | 0 refills | Status: AC

## 2016-11-05 MED ORDER — PIPERACILLIN-TAZOBACTAM 3.375 G IVPB: 3.3750 g | Freq: Three times a day (TID) | INTRAVENOUS | 0 refills | Status: DC

## 2016-11-05 MED ORDER — DAKINS (1/4 STRENGTH) 0.125 % EX SOLN: Freq: Two times a day (BID) | CUTANEOUS | 0 refills | Status: DC

## 2016-11-05 NOTE — Discharge Instructions (Signed)
Follow with Primary MD Sanders, Robyn, MD in 7 days  ° °Get CBC, CMP, 2 view Chest X ray checked  by Primary MD or SNF MD in 5-7 days ( we routinely change or add medications that can affect your baseline labs and fluid status, therefore we recommend that you get the mentioned basic workup next visit with your PCP, your PCP may decide not to get them or add new tests based on their clinical decision) ° °Activity: As tolerated with Full fall precautions use walker/cane & assistance as needed ° °Disposition Home   ° °Diet:   Heart Healthy  ° °For Heart failure patients - Check your Weight same time everyday, if you gain over 2 pounds, or you develop in leg swelling, experience more shortness of breath or chest pain, call your Primary MD immediately. Follow Cardiac Low Salt Diet and 1.5 lit/day fluid restriction. ° °On your next visit with your primary care physician please Get Medicines reviewed and adjusted. ° °Please request your Prim.MD to go over all Hospital Tests and Procedure/Radiological results at the follow up, please get all Hospital records sent to your Prim MD by signing hospital release before you go home. ° °If you experience worsening of your admission symptoms, develop shortness of breath, life threatening emergency, suicidal or homicidal thoughts you must seek medical attention immediately by calling 911 or calling your MD immediately  if symptoms less severe. ° °You Must read complete instructions/literature along with all the possible adverse reactions/side effects for all the Medicines you take and that have been prescribed to you. Take any new Medicines after you have completely understood and accpet all the possible adverse reactions/side effects.  ° °Do not drive, operate heavy machinery, perform activities at heights, swimming or participation in water activities or provide baby sitting services if your were admitted for syncope or siezures until you have seen by Primary MD or a Neurologist  and advised to do so again. ° °Do not drive when taking Pain medications.  ° ° °Do not take more than prescribed Pain, Sleep and Anxiety Medications ° °Special Instructions: If you have smoked or chewed Tobacco  in the last 2 yrs please stop smoking, stop any regular Alcohol  and or any Recreational drug use. ° °Wear Seat belts while driving. ° ° °Please note ° °You were cared for by a hospitalist during your hospital stay. If you have any questions about your discharge medications or the care you received while you were in the hospital after you are discharged, you can call the unit and asked to speak with the hospitalist on call if the hospitalist that took care of you is not available. Once you are discharged, your primary care physician will handle any further medical issues. Please note that NO REFILLS for any discharge medications will be authorized once you are discharged, as it is imperative that you return to your primary care physician (or establish a relationship with a primary care physician if you do not have one) for your aftercare needs so that they can reassess your need for medications and monitor your lab values. ° °

## 2016-11-05 NOTE — Progress Notes (Signed)
Pt. Placed on BiPAP for h/s on room air, tolerating well, RT to monitor.

## 2016-11-05 NOTE — Progress Notes (Signed)
PT Hydrotherapy session     11/05/16 1300  Wound / Incision (Open or Dehisced) 11/02/16 Other (Comment) Back Right;Upper Full Thickness. *PT*  Date First Assessed/Time First Assessed: 11/02/16 1600   Wound Type: Other (Comment)  Location: Back  Location Orientation: Right;Upper  Wound Description (Comments): Full Thickness. *PT*  Present on Admission: Yes  Dressing Type ABD;Gauze (Comment);Moist to dry (Dakins day 3 of 5 )  Dressing Status Clean;Dry;Intact  Dressing Change Frequency Daily  Site / Wound Assessment Dressing in place / Unable to assess  % Wound base Red or Granulating 35%  % Wound base Yellow/Fibrinous Exudate 60%  % Wound base Black/Eschar 5%  Peri-wound Assessment Excoriated  Margins Unattached edges (unapproximated)  Drainage Amount Moderate  Drainage Description Serous;Odor  Treatment Debridement (Selective);Hydrotherapy (Pulse lavage);Other (Comment) (gaze with dakins)  Hydrotherapy  Pulsed lavage therapy - wound location Right upper posterior trunk  Pulsed Lavage with Suction (psi) 8 psi (8-12)  Pulsed Lavage with Suction - Normal Saline Used 1000 mL  Pulsed Lavage Tip Tip with splash shield  Selective Debridement  Selective Debridement - Location Right Upper Posterior Trunk  Selective Debridement - Tools Used Forceps;Scissors  Selective Debridement - Tissue Removed yellow/brown tissue  Wound Therapy - Assess/Plan/Recommendations  Wound Therapy - Clinical Statement 51 yo male with history of C5 quadriplegia and multiple wounds. Pt is from home where he lives with his sister. [place] has been managing wounds. Today and noticed other days as well.   When we roll and move him to his side for the session to have access to his shoulder , we notice a lot of clear liquid (possibly urine) all on the front of his gown and body. Could this be coming from his suprapubic catheter? I alerted the patient so he can let his MD know as well. Nurse informed today as well.     Wound Therapy - Functional Problem List Immobility  Factors Delaying/Impairing Wound Healing Incontinence;Infection - systemic/local;Immobility;Altered sensation  Hydrotherapy Plan Debridement;Dressing change;Patient/family education;Pulsatile lavage with suction  Wound Therapy - Frequency 6X / week  Wound Therapy - Current Recommendations Case manager/social work;Surgery consult;WOC nurse  Wound Therapy - Follow Up Recommendations Home health RN;Skilled nursing facility;[place] and dressing changes to facilitate wound healing. Per Plastic Surgeon will plan to use "[facility]akin's for 5 days"   Clide Dales, [place] Pager: [phone] 11/05/2016

## 2016-11-05 NOTE — Progress Notes (Addendum)
1246 Faxed referral to [facility] for [place] and IV abx. Contacted [facility] RN and they are refusing to accept resumption of care. Jonnie Finner RN CCM Case Mgmt phone [phone] Contacted several [place] agency with referral. They had pt in the past and are not able to service. [facility] cannot accept [facility] referral but can provided IV abx through their pharmacy. [place] Liaison to make aware unable to transfer referral to anther [facility] agency. States she does not have RN for tonight to administer IV abx will follow up with NCM on tomorrow. Notified MD. Jonnie Finner RN CCM Case Mgmt phone [phone]
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
## 2016-11-06 MED ORDER — SODIUM CHLORIDE 0.9 % IV SOLN
INTRAVENOUS | Status: AC
  Administered 2016-11-06: 10:00:00 via INTRAVENOUS

## 2016-11-06 NOTE — Clinical Social Work Note (Signed)
Clinical Social Work Assessment  Patient Details  Name: [name] [name] MRN: [id number] Date of Birth: 12-Sep-1965  Date of referral:  11/06/16               Reason for consult:  Facility Placement                Permission sought to share information with:  Facility Sport and exercise psychologist Permission granted to share information::  Yes, Verbal Permission Granted  Name::     [name]  Agency::  SNFs  Relationship::  Friend     Contact Information:  [phone]   Housing/Transportation Living arrangements for the past 2 months:  [place] of Information:  Patient Patient Interpreter Needed:  None Criminal Activity/Legal Involvement Pertinent to Current Situation/Hospitalization:  No - Comment as needed Significant Relationships:  Siblings, Friend Lives with:  Self Do you feel safe going back to the place where you live?  Yes Need for family participation in patient care:  No (Coment)  Care giving concerns: Patient is from home and is quadriplegic, though has some use of upper extremities. Patient's sister was main caregiver, but she may now be undergoing surgery. Patient requires hydrotherapy for wounds. Patient    Social Worker assessment / plan: CSW met with patient at bedside, no family was present. Patient is hesitant about going to SNF, reporting that he has been in SNF before and they have not adequately managed his wounds in the time allotted by insurance coverage. Patient is interested in inpatient wound therapy. CSW explained medical team's recommendation for SNF with hydrotherapy. Patient indicated if SNF is absolutely necessary he prefers [facility], but maintains that inpatient wound care is his preference. CSW will fax out initial referrals for SNF.  Employment status:  Disabled (Comment on whether or not currently receiving Disability) Insurance information:  Programmer, applications Nurse, mental health) PT Recommendations:  [place] /  Referral to community resources:  [place]  Patient/Family's Response to care: Patient with understanding of his condition and discharge process.  Patient/Family's Understanding of and Emotional Response to Diagnosis, Current Treatment, and Prognosis: Patient with appropriate, albeit somewhat flat affect. Patient with extensive experience in SNF and with wound therapies. Patient understanding of conditions and communicates effectively.  Emotional Assessment Appearance:  Appears stated age Attitude/Demeanor/Rapport:  Other (appropriate) Affect (typically observed):  Appropriate, Calm Orientation:  Oriented to Self, Oriented to Place, Oriented to  Time, Oriented to Situation Alcohol / Substance use:  Not Applicable Psych involvement (Current and /or in the community):  No (Comment)  Discharge Needs  Concerns to be addressed:  Discharge Planning Concerns Readmission within the last 30 days:  No Current discharge risk:  Dependent with Mobility, Physical Impairment Barriers to Discharge:  Continued Medical Work up   Estanislado Emms, LCSW 11/06/2016, 3:57 PM

## 2016-11-06 NOTE — Progress Notes (Signed)
Per [facility] Liaison, they were working on getting everything arranged for [facility]. Contacted [facility] Liaison to have him follow up on SNF/LTAC. Spoke to [place] and states she will follow up and reach out to weekday CM on tomorrow. Jonnie Finner RN CCM Case Mgmt phone [phone]

## 2016-11-06 NOTE — Progress Notes (Signed)
Pt's sacral dressing has been changed to a wet-dry dressing closed with ABD pads and hypafix tape. Right shoulder wound changed to a wet-dry dressing with dankins-soaked 4x4 gauze, closed with ABD pad and hypafix tape. Colostomy bag had been punctured and filled with formed stool. Ostomy appliance and bag have both been changed. Patient repositioned and voices no complaints. Will continue to monitor at this time.
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
## 2016-11-06 NOTE — Progress Notes (Addendum)
Received call from [facility] rep and pt not safe discharge back to home. [facility] Liaison, Colman Cater states they are going discharge him from their services. States she has spoken to pt and family about discharge. Patient wants to go to SNF for completion of IV abx and wound care. CSW referral for SNF placement. Jonnie Finner RN CCM Case Mgmt phone [phone]

## 2016-11-07 DIAGNOSIS — D638 Anemia in other chronic diseases classified elsewhere: Secondary | ICD-10-CM | POA: Diagnosis not present

## 2016-11-07 DIAGNOSIS — Z79899 Other long term (current) drug therapy: Secondary | ICD-10-CM | POA: Diagnosis not present

## 2016-11-07 DIAGNOSIS — A419 Sepsis, unspecified organism: Secondary | ICD-10-CM | POA: Diagnosis not present

## 2016-11-07 DIAGNOSIS — L89119 Pressure ulcer of right upper back, unspecified stage: Secondary | ICD-10-CM | POA: Diagnosis not present

## 2016-11-07 DIAGNOSIS — E43 Unspecified severe protein-calorie malnutrition: Secondary | ICD-10-CM | POA: Diagnosis not present

## 2016-11-07 DIAGNOSIS — G4733 Obstructive sleep apnea (adult) (pediatric): Secondary | ICD-10-CM | POA: Diagnosis not present

## 2016-11-07 DIAGNOSIS — M4624 Osteomyelitis of vertebra, thoracic region: Secondary | ICD-10-CM | POA: Diagnosis not present

## 2016-11-07 DIAGNOSIS — I10 Essential (primary) hypertension: Secondary | ICD-10-CM | POA: Diagnosis not present

## 2016-11-07 DIAGNOSIS — Z9989 Dependence on other enabling machines and devices: Secondary | ICD-10-CM | POA: Diagnosis not present

## 2016-11-07 DIAGNOSIS — Z933 Colostomy status: Secondary | ICD-10-CM | POA: Diagnosis not present

## 2016-11-07 DIAGNOSIS — K3184 Gastroparesis: Secondary | ICD-10-CM | POA: Diagnosis not present

## 2016-11-07 DIAGNOSIS — L89114 Pressure ulcer of right upper back, stage 4: Secondary | ICD-10-CM | POA: Diagnosis not present

## 2016-11-07 DIAGNOSIS — R079 Chest pain, unspecified: Secondary | ICD-10-CM | POA: Diagnosis not present

## 2016-11-07 DIAGNOSIS — L89134 Pressure ulcer of right lower back, stage 4: Secondary | ICD-10-CM | POA: Diagnosis not present

## 2016-11-07 DIAGNOSIS — D509 Iron deficiency anemia, unspecified: Secondary | ICD-10-CM | POA: Diagnosis not present

## 2016-11-07 DIAGNOSIS — L89154 Pressure ulcer of sacral region, stage 4: Secondary | ICD-10-CM | POA: Diagnosis not present

## 2016-11-07 DIAGNOSIS — R6520 Severe sepsis without septic shock: Secondary | ICD-10-CM | POA: Diagnosis not present

## 2016-11-07 DIAGNOSIS — Z9359 Other cystostomy status: Secondary | ICD-10-CM | POA: Diagnosis not present

## 2016-11-07 DIAGNOSIS — Y742 Prosthetic and other implants, materials and accessory general hospital and personal-use devices associated with adverse incidents: Secondary | ICD-10-CM | POA: Diagnosis not present

## 2016-11-07 DIAGNOSIS — G8250 Quadriplegia, unspecified: Secondary | ICD-10-CM | POA: Diagnosis not present

## 2016-11-07 DIAGNOSIS — T82848A Pain from vascular prosthetic devices, implants and grafts, initial encounter: Secondary | ICD-10-CM | POA: Diagnosis present

## 2016-11-07 DIAGNOSIS — Z452 Encounter for adjustment and management of vascular access device: Secondary | ICD-10-CM | POA: Diagnosis not present

## 2016-11-07 LAB — BASIC METABOLIC PANEL
Anion gap: 8 (ref 5–15)
BUN: 20 mg/dL (ref 6–20)
CALCIUM: 8.5 mg/dL — AB (ref 8.9–10.3)
CO2: 23 mmol/L (ref 22–32)
CREATININE: 0.49 mg/dL — AB (ref 0.61–1.24)
Chloride: 109 mmol/L (ref 101–111)
GFR calc Af Amer: >60 mL/min (ref >60)
GLUCOSE: 155 mg/dL — AB (ref 65–99)
Potassium: 3 mmol/L — ABNORMAL LOW (ref 3.5–5.1)
Sodium: 140 mmol/L (ref 135–145)

## 2016-11-07 LAB — CREATININE, SERUM: Creatinine, Ser: <0.3 mg/dL — ABNORMAL LOW (ref 0.61–1.24)

## 2016-11-07 LAB — MAGNESIUM: Magnesium: 1.6 mg/dL — ABNORMAL LOW (ref 1.7–2.4)

## 2016-11-07 MED ORDER — POTASSIUM CHLORIDE CRYS ER 20 MEQ PO TBCR
40.0000 meq | EXTENDED_RELEASE_TABLET | Freq: Four times a day (QID) | ORAL | Status: AC
  Administered 2016-11-07 (×2): 40 meq via ORAL
  Filled 2016-11-07 (×2): qty 2

## 2016-11-07 MED ORDER — HEPARIN SOD (PORK) LOCK FLUSH 100 UNIT/ML IV SOLN
500.0000 [IU] | INTRAVENOUS | Status: AC | PRN
  Administered 2016-11-07: 500 [IU]

## 2016-11-07 MED ORDER — MAGNESIUM SULFATE 2 GM/50ML IV SOLN
2.0000 g | Freq: Once | INTRAVENOUS | Status: AC
  Administered 2016-11-07: 2 g via INTRAVENOUS
  Filled 2016-11-07: qty 50

## 2016-11-07 NOTE — Care Management Note (Signed)
Case Management Note  Patient Details  Name: [name] [name] MRN: [id number] Date of Birth: 09-23-1965  Subjective/Objective: Spoke to patient several times this am about LTAC not being approved-[facility]-not approved for LTAC-insurance approved for SNF. Exploring another LTAC is not appropriate since has d/c order to SNF. Spoke to med advisor-will issue HINN.                    Action/Plan:d/c SNF.   Expected Discharge Date:  11/07/16               Expected Discharge Plan:  Skilled Nursing Facility  In-House Referral:  Clinical Social Work  Discharge planning Services  CM Consult  Post Acute Care Choice:    Choice offered to:     DME Arranged:    DME Agency:     HH Arranged:    [place] Agency:     Status of Service:  Completed, signed off  If discussed at [employer], dates discussed:    Additional Comments:  Dessa Phi, RN 11/07/2016, 1:18 PM

## 2016-11-07 NOTE — Progress Notes (Signed)
Authorization received at 6:00pm. [id number] 6/25-6/27 Information provided to liaison. [facility] Called to transport to [facility].  CPAP setting provided. Nurse given number for report.    Kathrin Greathouse, Latanya Presser, MSW Clinical Social Worker 5E and Psychiatric Service Line [phone] 11/07/2016  6:10 PM

## 2016-11-07 NOTE — Care Management Note (Signed)
Case Management Note  Patient Details  Name: [name] [name] MRN: [id number] Date of Birth: 1965-09-04  Subjective/Objective:Per CM prior note-[facility]-not following patient for [facility] not appropriate-d/c plan SNF-CSW following.                    Action/Plan:d/c SNF.   Expected Discharge Date:  11/07/16               Expected Discharge Plan:  Skilled Nursing Facility  In-House Referral:  Clinical Social Work  Discharge planning Services  CM Consult  Post Acute Care Choice:    Choice offered to:     DME Arranged:    DME Agency:     HH Arranged:    [place] Agency:     Status of Service:  Completed, signed off  If discussed at [employer], dates discussed:    Additional Comments:  Dessa Phi, RN 11/07/2016, 10:55 AM

## 2016-11-07 NOTE — Progress Notes (Signed)
Patient settings for CPAP QHS at discharge are auto titrate max 20 cmH2O and min 5 CmH2O with no O2 bleed in. RN made aware of settings.

## 2016-11-07 NOTE — Clinical Social Work Placement (Signed)
[employer] medicare authorization prior to SNF admission. Will inform medical staff once authorization is received.   CLINICAL SOCIAL WORK PLACEMENT  NOTE  Date:  11/07/2016  Patient Details  Name: [name] [name] MRN: [id number] Date of Birth: [birth year]  Clinical Social Work is seeking post-discharge placement for this patient at the [place] level of care (*CSW will initial, date and re-position this form in  chart as items are completed):  Yes   Patient/family provided with [place] Work Department's list of facilities offering this level of care within the geographic area requested by the patient (or if unable, by the patient's family).  Yes   Patient/family informed of their freedom to choose among providers that offer the needed level of care, that participate in Medicare, Medicaid or managed care program needed by the patient, have an available bed and are willing to accept the patient.  Yes   Patient/family informed of [facility]'s ownership interest in [facility] and [facility], as well as of the fact that they are under no obligation to receive care at these facilities.  PASRR submitted to EDS on       PASRR number received on       Existing PASRR number confirmed on 11/07/16     FL2 transmitted to all facilities in geographic area requested by pt/family on       FL2 transmitted to all facilities within larger geographic area on 11/06/16     Patient informed that his/her managed care company has contracts with or will negotiate with certain facilities, including the following:  [facility]         Patient/family informed of bed offers received.  Patient chooses bed at       Physician recommends and patient chooses bed at      Patient to be transferred to [facility] on 11/07/16.  Patient to be transferred to facility by PTAR     Patient family notified on 11/07/16 of transfer.  Name of  family member notified:        PHYSICIAN       Additional Comment:    _______________________________________________ Lia Hopping, LCSW 11/07/2016, 11:06 AM

## 2016-11-07 NOTE — Progress Notes (Signed)
HYDROTHERAPY TREATMENT     11/07/16 1240  Subjective Assessment  Subjective "okay"  Patient and Family Stated Goals for wounds to heal  Prior Treatments wound care center  Evaluation and Treatment  Evaluation and Treatment Procedures Explained to Patient/Family Yes  Evaluation and Treatment Procedures agreed to  Wound / Incision (Open or Dehisced) 11/02/16 Other (Comment) Back Right;Upper Full Thickness. *PT*  Date First Assessed/Time First Assessed: 11/02/16 1600   Wound Type: Other (Comment)  Location: Back  Location Orientation: Right;Upper  Wound Description (Comments): Full Thickness. *PT*  Present on Admission: Yes  Dressing Type ABD;Gauze;Moist to dry (Dakin's day 4/5)  Dressing Changed Changed  Dressing Status Old drainage  Dressing Change Frequency Daily  Site / Wound Assessment Brown;Pink;Bleeding;Yellow  % Wound base Red or Granulating 35%  % Wound base Yellow/Fibrinous Exudate 60%  % Wound base Black/Eschar 5%  Peri-wound Assessment Excoriated  Wound Length (cm) 8 cm  Wound Width (cm) 11 cm  Wound Depth (cm) 2 cm  Margins Unattached edges (unapproximated)  Drainage Amount Moderate  Drainage Description Serous;Odor  Non-staged Wound Description Full thickness  Treatment Hydrotherapy (Pulse lavage);Debridement (Selective) (Dakin's 4/5)  Hydrotherapy  Pulsed lavage therapy - wound location Right upper posterior trunk  Pulsed Lavage with Suction (psi) 8 psi  Pulsed Lavage with Suction - Normal Saline Used 1000 mL  Pulsed Lavage Tip Tip with splash shield  Selective Debridement  Selective Debridement - Location Right Upper Posterior Trunk  Selective Debridement - Tools Used Forceps;Scissors  Selective Debridement - Tissue Removed yellow/brown tissue  Wound Therapy - Assess/Plan/Recommendations  Wound Therapy - Clinical Statement 51 yo male with history of C5 quadriplegia and multiple wounds. Pt is from home where he lives with his sister. [place] had been  managing wounds  Wound Therapy - Functional Problem List Immobility  Factors Delaying/Impairing Wound Healing Incontinence;Infection - systemic/local;Immobility;Altered sensation  Hydrotherapy Plan Debridement;Dressing change;Patient/family education;Pulsatile lavage with suction  Wound Therapy - Frequency 6X / week  Wound Therapy - Current Recommendations Case manager/social work  Wound Therapy - Follow Up Recommendations Skilled nursing facility  Wound Plan Hydrotherapy and dressing changes to facilitate wound healing. Per Plastic Surgeon will plan to use "Dakin's for 5 days"  Wound Therapy Goals - Improve the function of patient's integumentary system by progressing the wound(s) through the phases of wound healing by:  Decrease Necrotic Tissue to 40  Decrease Necrotic Tissue - Progress Progressing toward goal  Increase Granulation Tissue to 60  Increase Granulation Tissue - Progress Progressing toward goal  Improve Drainage Characteristics Min  Improve Drainage Characteristics - Progress Progressing toward goal  Goals/treatment plan/discharge plan were made with and agreed upon by patient/family Yes  Time For Goal Achievement 2 weeks  Wound Therapy - Potential for Goals Fair   Weston Anna, MPT [phone]

## 2016-11-08 DIAGNOSIS — L89114 Pressure ulcer of right upper back, stage 4: Secondary | ICD-10-CM | POA: Diagnosis not present

## 2016-11-08 DIAGNOSIS — L89154 Pressure ulcer of sacral region, stage 4: Secondary | ICD-10-CM | POA: Diagnosis not present

## 2016-11-08 DIAGNOSIS — L89134 Pressure ulcer of right lower back, stage 4: Secondary | ICD-10-CM | POA: Diagnosis not present

## 2016-11-09 DIAGNOSIS — G8250 Quadriplegia, unspecified: Secondary | ICD-10-CM | POA: Diagnosis not present

## 2016-11-09 LAB — AEROBIC/ANAEROBIC CULTURE W GRAM STAIN (SURGICAL/DEEP WOUND)

## 2016-11-10 ENCOUNTER — Non-Acute Institutional Stay (SKILLED_NURSING_FACILITY): Payer: Medicare Other | Admitting: Adult Health

## 2016-11-10 ENCOUNTER — Encounter: Payer: Self-pay | Admitting: Adult Health

## 2016-11-10 DIAGNOSIS — G4733 Obstructive sleep apnea (adult) (pediatric): Secondary | ICD-10-CM | POA: Diagnosis not present

## 2016-11-10 DIAGNOSIS — M4624 Osteomyelitis of vertebra, thoracic region: Secondary | ICD-10-CM | POA: Diagnosis not present

## 2016-11-10 DIAGNOSIS — A419 Sepsis, unspecified organism: Secondary | ICD-10-CM

## 2016-11-10 DIAGNOSIS — I10 Essential (primary) hypertension: Secondary | ICD-10-CM

## 2016-11-10 DIAGNOSIS — L89154 Pressure ulcer of sacral region, stage 4: Secondary | ICD-10-CM | POA: Diagnosis not present

## 2016-11-10 DIAGNOSIS — D509 Iron deficiency anemia, unspecified: Secondary | ICD-10-CM

## 2016-11-10 DIAGNOSIS — K3184 Gastroparesis: Secondary | ICD-10-CM

## 2016-11-10 DIAGNOSIS — Z9989 Dependence on other enabling machines and devices: Secondary | ICD-10-CM

## 2016-11-10 DIAGNOSIS — E43 Unspecified severe protein-calorie malnutrition: Secondary | ICD-10-CM

## 2016-11-10 LAB — CBC AND DIFFERENTIAL
HEMATOCRIT: 40 — AB (ref 41–53)
Hemoglobin: 12.3 — AB (ref 13.5–17.5)
NEUTROS ABS: 4
PLATELETS: 277 (ref 150–399)
WBC: 5.4

## 2016-11-10 LAB — BASIC METABOLIC PANEL
BUN: 10 (ref 4–21)
Creatinine: 0.3 — AB (ref 0.6–1.3)
Glucose: 98
Potassium: 3.5 (ref 3.4–5.3)
Sodium: 139 (ref 137–147)
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
## 2016-11-13 DIAGNOSIS — L89134 Pressure ulcer of right lower back, stage 4: Secondary | ICD-10-CM | POA: Diagnosis not present

## 2016-11-13 DIAGNOSIS — M8660 Other chronic osteomyelitis, unspecified site: Secondary | ICD-10-CM | POA: Diagnosis not present

## 2016-11-13 DIAGNOSIS — S31809A Unspecified open wound of unspecified buttock, initial encounter: Secondary | ICD-10-CM | POA: Diagnosis not present

## 2016-11-13 DIAGNOSIS — L89114 Pressure ulcer of right upper back, stage 4: Secondary | ICD-10-CM | POA: Diagnosis not present

## 2016-11-13 DIAGNOSIS — I739 Peripheral vascular disease, unspecified: Secondary | ICD-10-CM | POA: Diagnosis not present

## 2016-11-13 DIAGNOSIS — L89154 Pressure ulcer of sacral region, stage 4: Secondary | ICD-10-CM | POA: Diagnosis not present

## 2016-11-13 DIAGNOSIS — S31102A Unspecified open wound of abdominal wall, epigastric region without penetration into peritoneal cavity, initial encounter: Secondary | ICD-10-CM | POA: Diagnosis not present

## 2016-11-13 DIAGNOSIS — Z9359 Other cystostomy status: Secondary | ICD-10-CM | POA: Diagnosis not present

## 2016-11-13 DIAGNOSIS — A419 Sepsis, unspecified organism: Secondary | ICD-10-CM | POA: Diagnosis not present

## 2016-11-13 DIAGNOSIS — M4624 Osteomyelitis of vertebra, thoracic region: Secondary | ICD-10-CM | POA: Diagnosis not present

## 2016-11-13 DIAGNOSIS — I10 Essential (primary) hypertension: Secondary | ICD-10-CM | POA: Diagnosis not present

## 2016-11-13 DIAGNOSIS — G40909 Epilepsy, unspecified, not intractable, without status epilepticus: Secondary | ICD-10-CM | POA: Diagnosis not present

## 2016-11-13 DIAGNOSIS — E43 Unspecified severe protein-calorie malnutrition: Secondary | ICD-10-CM | POA: Diagnosis not present

## 2016-11-13 DIAGNOSIS — L8944 Pressure ulcer of contiguous site of back, buttock and hip, stage 4: Secondary | ICD-10-CM | POA: Diagnosis not present

## 2016-11-13 DIAGNOSIS — G4733 Obstructive sleep apnea (adult) (pediatric): Secondary | ICD-10-CM | POA: Diagnosis not present

## 2016-11-13 DIAGNOSIS — L89119 Pressure ulcer of right upper back, unspecified stage: Secondary | ICD-10-CM | POA: Diagnosis not present

## 2016-11-13 DIAGNOSIS — M869 Osteomyelitis, unspecified: Secondary | ICD-10-CM | POA: Diagnosis not present

## 2016-11-13 DIAGNOSIS — L089 Local infection of the skin and subcutaneous tissue, unspecified: Secondary | ICD-10-CM | POA: Diagnosis not present

## 2016-11-13 DIAGNOSIS — D638 Anemia in other chronic diseases classified elsewhere: Secondary | ICD-10-CM | POA: Diagnosis not present

## 2016-11-13 DIAGNOSIS — Z933 Colostomy status: Secondary | ICD-10-CM | POA: Diagnosis not present

## 2016-11-13 DIAGNOSIS — G8250 Quadriplegia, unspecified: Secondary | ICD-10-CM | POA: Diagnosis not present

## 2016-11-14 ENCOUNTER — Encounter (HOSPITAL_BASED_OUTPATIENT_CLINIC_OR_DEPARTMENT_OTHER): Payer: No Typology Code available for payment source

## 2016-11-15 DIAGNOSIS — G8250 Quadriplegia, unspecified: Secondary | ICD-10-CM | POA: Diagnosis not present

## 2016-11-17 ENCOUNTER — Institutional Professional Consult (permissible substitution): Payer: Self-pay | Admitting: Neurology

## 2016-11-22 DIAGNOSIS — M869 Osteomyelitis, unspecified: Secondary | ICD-10-CM | POA: Diagnosis not present

## 2016-11-22 DIAGNOSIS — L89134 Pressure ulcer of right lower back, stage 4: Secondary | ICD-10-CM | POA: Diagnosis not present

## 2016-11-22 DIAGNOSIS — M8660 Other chronic osteomyelitis, unspecified site: Secondary | ICD-10-CM | POA: Diagnosis not present

## 2016-11-22 DIAGNOSIS — L89114 Pressure ulcer of right upper back, stage 4: Secondary | ICD-10-CM | POA: Diagnosis not present

## 2016-11-22 DIAGNOSIS — L89154 Pressure ulcer of sacral region, stage 4: Secondary | ICD-10-CM | POA: Diagnosis not present

## 2016-11-22 DIAGNOSIS — G8250 Quadriplegia, unspecified: Secondary | ICD-10-CM | POA: Diagnosis not present

## 2016-11-23 ENCOUNTER — Telehealth: Payer: Self-pay | Admitting: *Deleted

## 2016-11-23 NOTE — Telephone Encounter (Signed)
Pt called and left message requesting to cancel appts for lab and injection on Friday 11/25/16.  Spoke with pt and was informed that he is currently in a nursing home for rehab.  Pt could have nursing home provide transportation to the appts ; however, pt will not have his personal wheelchair to maneuver around.  Pt is quadriplegic.   Instructed pt to call office back when he is home - which is early August.  Appts will be rescheduled for pt at that time.   Pt voiced understanding. Pt's   Phone      [phone].

## 2016-11-23 NOTE — Telephone Encounter (Signed)
Patient called to see when hospital follow up is scheduled. He completes IV antibiotics 7/25, is scheduled to see Dr [name] 8/8 9:00. He is currently at [facility]. Patient would prefer something late morning - afternoon if this appointment needs to be moved up. Please advise. Landis Gandy, RN

## 2016-11-24 NOTE — Telephone Encounter (Signed)
There is a later open slot with [facility] on 8/6.

## 2016-11-25 ENCOUNTER — Ambulatory Visit: Payer: Medicare Other

## 2016-11-25 ENCOUNTER — Other Ambulatory Visit: Payer: Medicare Other

## 2016-11-26 ENCOUNTER — Emergency Department (HOSPITAL_COMMUNITY)
Admission: EM | Admit: 2016-11-26 | Discharge: 2016-11-27 | Disposition: A | Discharge status: Home / Self Care | Payer: Medicare Other | Attending: Physician Assistant | Admitting: Physician Assistant

## 2016-11-26 DIAGNOSIS — I10 Essential (primary) hypertension: Secondary | ICD-10-CM | POA: Diagnosis not present

## 2016-11-26 DIAGNOSIS — Z452 Encounter for adjustment and management of vascular access device: Secondary | ICD-10-CM | POA: Diagnosis not present

## 2016-11-26 DIAGNOSIS — Y742 Prosthetic and other implants, materials and accessory general hospital and personal-use devices associated with adverse incidents: Secondary | ICD-10-CM | POA: Insufficient documentation

## 2016-11-26 DIAGNOSIS — R079 Chest pain, unspecified: Secondary | ICD-10-CM | POA: Diagnosis not present

## 2016-11-26 DIAGNOSIS — Z79899 Other long term (current) drug therapy: Secondary | ICD-10-CM | POA: Insufficient documentation

## 2016-11-26 DIAGNOSIS — T82848A Pain from vascular prosthetic devices, implants and grafts, initial encounter: Secondary | ICD-10-CM | POA: Diagnosis present

## 2016-11-26 NOTE — ED Triage Notes (Signed)
Brought in by EMS from [facility]. Per EMS pt complain of pain on his port when administering meds and flushing. Per EMS no reports of Fever in SNF. Patient had wound vac on his pressure ulcer on his sacrum and was disconnected by SNF.

## 2016-11-26 NOTE — ED Notes (Signed)
Bed: [facility] Expected date:  Expected time:  Means of arrival:  Comments: 51yo M PICC PAIN

## 2016-11-27 ENCOUNTER — Emergency Department (HOSPITAL_COMMUNITY): Payer: Medicare Other

## 2016-11-27 DIAGNOSIS — D638 Anemia in other chronic diseases classified elsewhere: Secondary | ICD-10-CM | POA: Diagnosis not present

## 2016-11-27 DIAGNOSIS — S31809A Unspecified open wound of unspecified buttock, initial encounter: Secondary | ICD-10-CM | POA: Diagnosis not present

## 2016-11-27 DIAGNOSIS — Z452 Encounter for adjustment and management of vascular access device: Secondary | ICD-10-CM | POA: Diagnosis not present

## 2016-11-27 DIAGNOSIS — M4624 Osteomyelitis of vertebra, thoracic region: Secondary | ICD-10-CM | POA: Diagnosis not present

## 2016-11-27 DIAGNOSIS — T8189XA Other complications of procedures, not elsewhere classified, initial encounter: Secondary | ICD-10-CM | POA: Diagnosis not present

## 2016-11-27 DIAGNOSIS — Z79899 Other long term (current) drug therapy: Secondary | ICD-10-CM | POA: Diagnosis not present

## 2016-11-27 DIAGNOSIS — T148XXA Other injury of unspecified body region, initial encounter: Secondary | ICD-10-CM | POA: Diagnosis not present

## 2016-11-27 DIAGNOSIS — L89114 Pressure ulcer of right upper back, stage 4: Secondary | ICD-10-CM | POA: Diagnosis not present

## 2016-11-27 DIAGNOSIS — L89613 Pressure ulcer of right heel, stage 3: Secondary | ICD-10-CM | POA: Diagnosis not present

## 2016-11-27 DIAGNOSIS — I10 Essential (primary) hypertension: Secondary | ICD-10-CM | POA: Diagnosis not present

## 2016-11-27 DIAGNOSIS — S31102A Unspecified open wound of abdominal wall, epigastric region without penetration into peritoneal cavity, initial encounter: Secondary | ICD-10-CM | POA: Diagnosis not present

## 2016-11-27 DIAGNOSIS — L8944 Pressure ulcer of contiguous site of back, buttock and hip, stage 4: Secondary | ICD-10-CM | POA: Diagnosis not present

## 2016-11-27 DIAGNOSIS — A419 Sepsis, unspecified organism: Secondary | ICD-10-CM | POA: Diagnosis not present

## 2016-11-27 DIAGNOSIS — G40909 Epilepsy, unspecified, not intractable, without status epilepticus: Secondary | ICD-10-CM | POA: Diagnosis not present

## 2016-11-27 DIAGNOSIS — D509 Iron deficiency anemia, unspecified: Secondary | ICD-10-CM | POA: Diagnosis not present

## 2016-11-27 DIAGNOSIS — L89119 Pressure ulcer of right upper back, unspecified stage: Secondary | ICD-10-CM | POA: Diagnosis not present

## 2016-11-27 DIAGNOSIS — G8250 Quadriplegia, unspecified: Secondary | ICD-10-CM | POA: Diagnosis not present

## 2016-11-27 DIAGNOSIS — I739 Peripheral vascular disease, unspecified: Secondary | ICD-10-CM | POA: Diagnosis not present

## 2016-11-27 DIAGNOSIS — Z9359 Other cystostomy status: Secondary | ICD-10-CM | POA: Diagnosis not present

## 2016-11-27 DIAGNOSIS — L089 Local infection of the skin and subcutaneous tissue, unspecified: Secondary | ICD-10-CM | POA: Diagnosis not present

## 2016-11-27 DIAGNOSIS — Z933 Colostomy status: Secondary | ICD-10-CM | POA: Diagnosis not present

## 2016-11-27 DIAGNOSIS — E43 Unspecified severe protein-calorie malnutrition: Secondary | ICD-10-CM | POA: Diagnosis not present

## 2016-11-27 DIAGNOSIS — K3184 Gastroparesis: Secondary | ICD-10-CM | POA: Diagnosis not present

## 2016-11-27 DIAGNOSIS — L89154 Pressure ulcer of sacral region, stage 4: Secondary | ICD-10-CM | POA: Diagnosis not present

## 2016-11-27 DIAGNOSIS — R079 Chest pain, unspecified: Secondary | ICD-10-CM | POA: Diagnosis not present

## 2016-11-27 DIAGNOSIS — B999 Unspecified infectious disease: Secondary | ICD-10-CM | POA: Diagnosis not present

## 2016-11-27 DIAGNOSIS — L89890 Pressure ulcer of other site, unstageable: Secondary | ICD-10-CM | POA: Diagnosis not present

## 2016-11-27 DIAGNOSIS — G4733 Obstructive sleep apnea (adult) (pediatric): Secondary | ICD-10-CM | POA: Diagnosis not present

## 2016-11-27 MED ORDER — PIPERACILLIN-TAZOBACTAM 4.5 G IVPB: 4.5000 g | Freq: Once | INTRAVENOUS | Status: DC

## 2016-11-27 MED ORDER — PIPERACILLIN-TAZOBACTAM 3.375 G IVPB 30 MIN
3.3750 g | Freq: Once | INTRAVENOUS | Status: AC
  Administered 2016-11-27: 3.375 g via INTRAVENOUS
  Filled 2016-11-27: qty 50

## 2016-11-27 NOTE — Progress Notes (Signed)
Patient's port-a-cath malpositioned from the NH. IVT RN deaccessed and reaccessed per assessment. Patient with no complaints of pain. Port flushing fine with great blood return.

## 2016-11-27 NOTE — Discharge Instructions (Signed)
1. Medications: usual home medications 2. Treatment: rest, drink plenty of fluids,  3. Follow Up: Please followup with your primary doctor as needed for discussion of your diagnoses and further evaluation after today's visit; if you do not have a primary care doctor use the resource guide provided to find one; Please return to the ER for return or worsening symptoms.
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
## 2016-11-28 DIAGNOSIS — G8250 Quadriplegia, unspecified: Secondary | ICD-10-CM | POA: Diagnosis not present

## 2016-11-28 NOTE — Telephone Encounter (Signed)
Dr [name], please advise where you would like to see [facility].  He would like to see you before he finished IV antibiotics on 7/25, but needs an afternoon appointment. Thanks!

## 2016-11-29 DIAGNOSIS — L89890 Pressure ulcer of other site, unstageable: Secondary | ICD-10-CM | POA: Diagnosis not present

## 2016-11-29 DIAGNOSIS — L89613 Pressure ulcer of right heel, stage 3: Secondary | ICD-10-CM | POA: Diagnosis not present

## 2016-11-29 DIAGNOSIS — L89154 Pressure ulcer of sacral region, stage 4: Secondary | ICD-10-CM | POA: Diagnosis not present

## 2016-11-29 IMAGING — XA IR FLUORO GUIDE CV LINE*R*
2 series · 2 of 2 positions shown · non-contrast
Comparison: none

INDICATION: 48-year-old with history of quadriplegia and being admitted with
vomiting. Patient has poor venous access and needs central line.

[Series 1: care single · 1 of 1 slices shown]
[im 1/1]
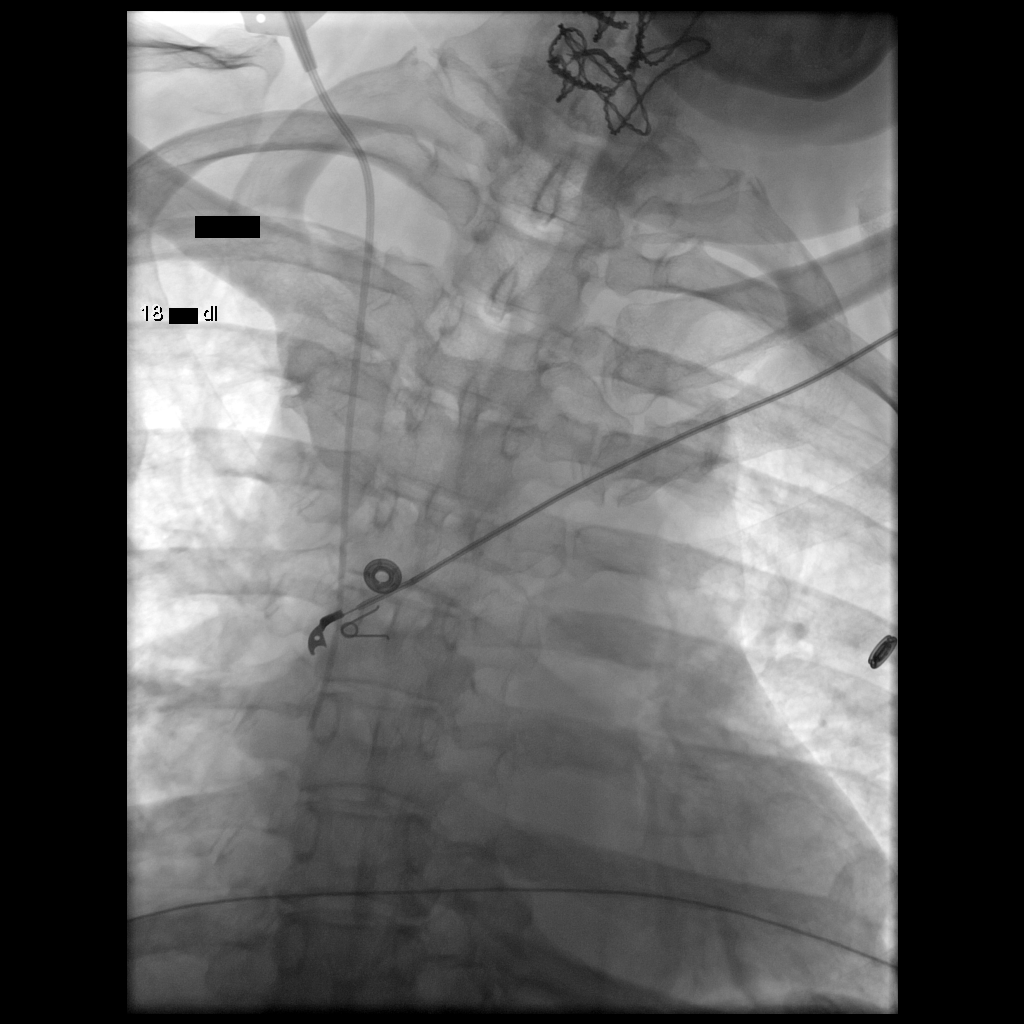

[Series 300: sp fluoro guide cv line*l* · 1 of 1 slices shown]
[im 1/1]
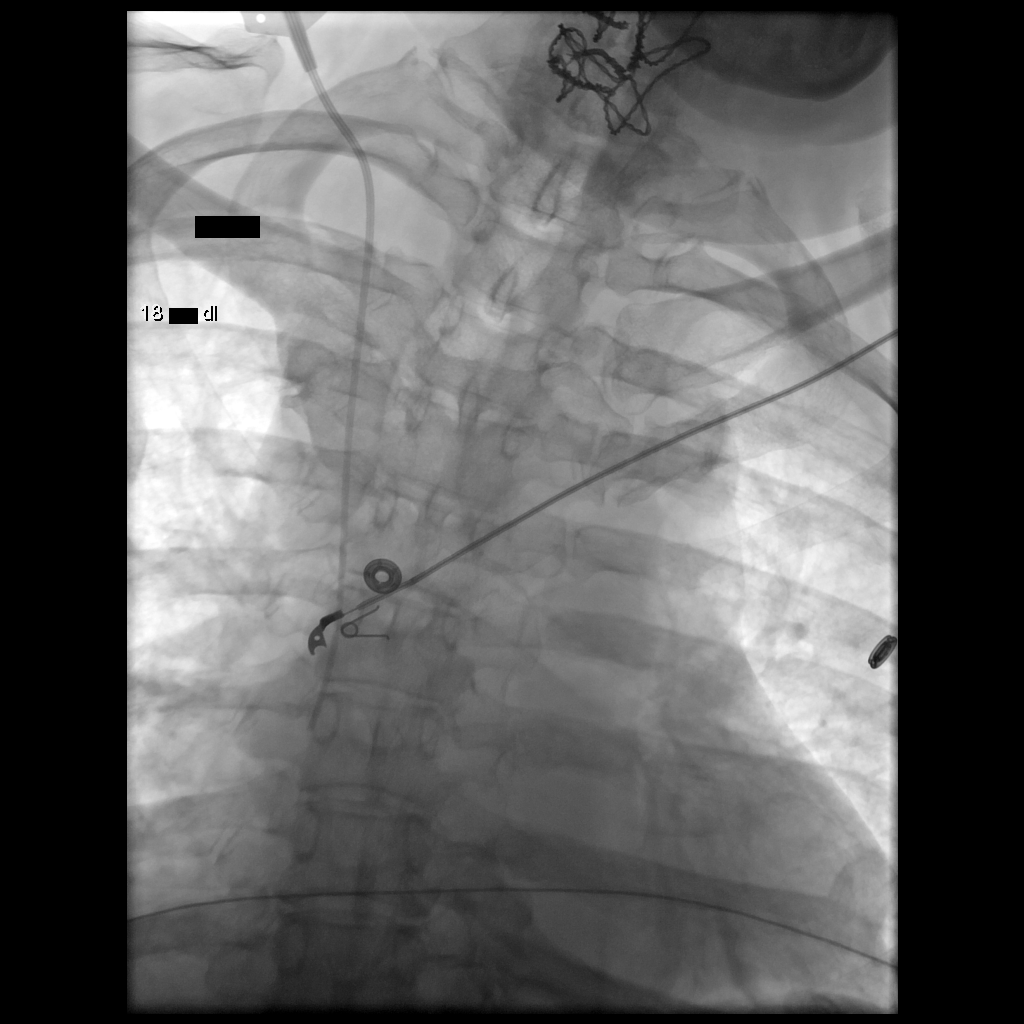

[2 of 2 positions shown; findings below may reference images not displayed]

EXAM:
FLUOROSCOPIC AND ULTRASOUND GUIDED PLACEMENT OF A CENTRAL VENOUS
CATHETER

FLUOROSCOPY TIME:  12 seconds,  2 mGy

MEDICATIONS:
None

ANESTHESIA/SEDATION:
Moderate sedation time: None

PROCEDURE:
The procedure was explained to the patient. The risks and benefits
of the procedure were discussed and the patient's questions were
addressed. Informed consent was obtained from the patient. The
patient was placed supine on the interventional table. Ultrasound
confirmed a patent right internal jugular vein. Right side of the
neck was prepped and draped in a sterile fashion. Maximal barrier
sterile technique was utilized including caps, mask, sterile gowns,
sterile gloves, sterile drape, hand hygiene and skin antiseptic.
Skin was anesthetized with 1% lidocaine. 21 gauge needle was
directed into the right internal jugular vein with ultrasound
guidance. A wire was advanced centrally. A peel-away sheath was
placed. A dual lumen Power PICC line was cut to 18 cm. Catheter was
placed through the peel-away sheath and the tip was placed at the
superior cavoatrial junction. Both lumens aspirated and flushed
well. Catheter was sutured to the skin. A sterile dressing was
placed. Fluoroscopic and ultrasound images were taken and saved for
documentation.
FINDINGS: Patent right internal jugular vein. Catheter tip at the superior
cavoatrial junction.

COMPLICATIONS:
None
IMPRESSION: Successful placement of a right jugular central venous catheter with
ultrasound and fluoroscopic guidance.

## 2016-11-29 IMAGING — CR DG ABDOMEN ACUTE W/ 1V CHEST
8 of 9 series · 8 of 9 positions shown · non-contrast
Comparison: 12/22/2013.  09/25/2013.

CLINICAL DATA: Quadriplegia.  Nausea vomiting.  Fever.

EXAM:
ACUTE ABDOMEN SERIES (ABDOMEN 2 VIEW & CHEST 1 VIEW)

[x chest ap (1 of 2)]
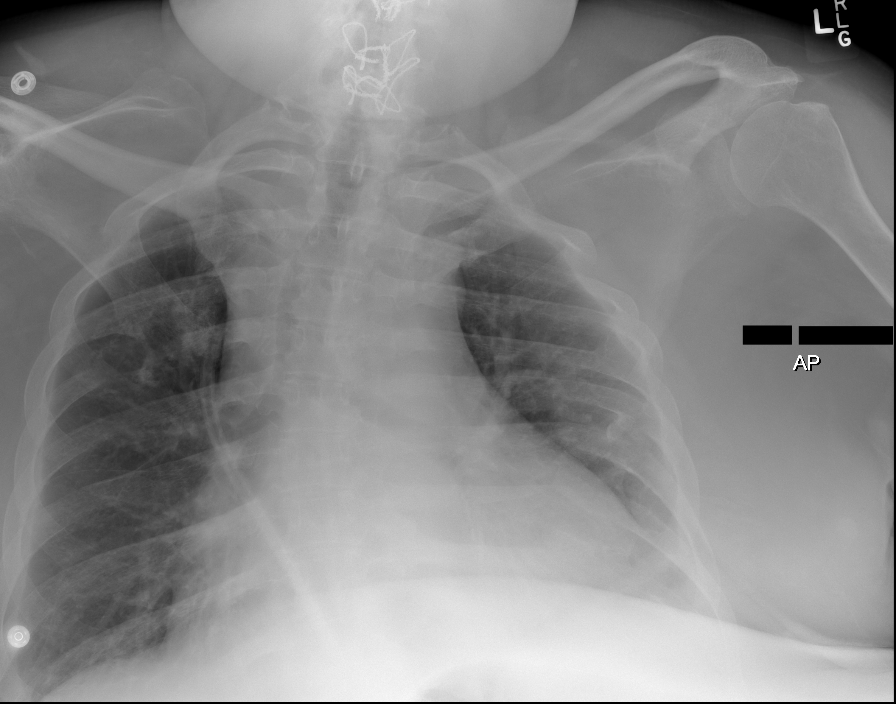

[x chest ap (2 of 2)]
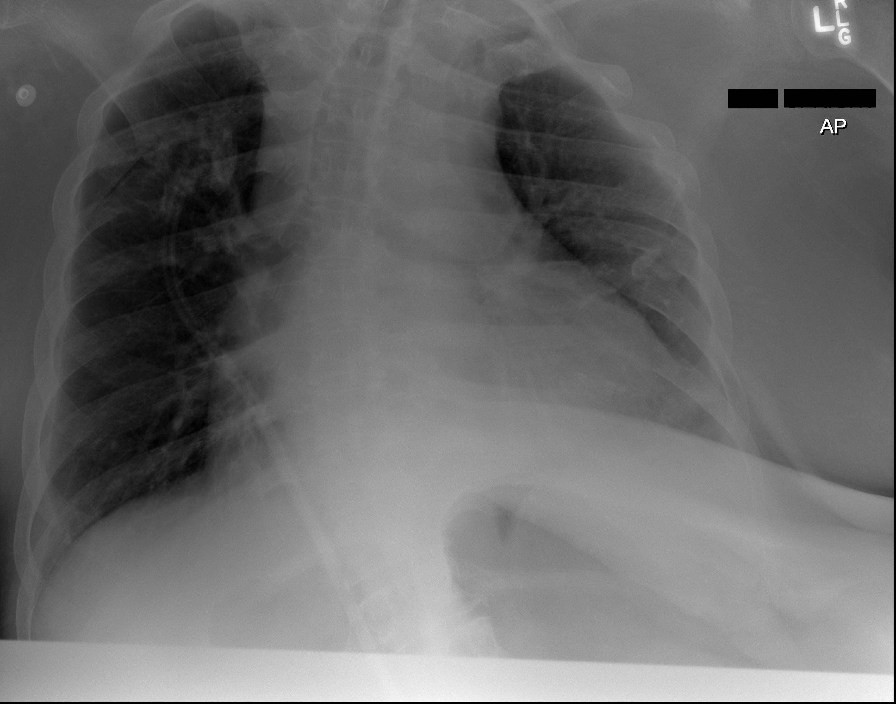

[t abdomen supine (1 of 4)]
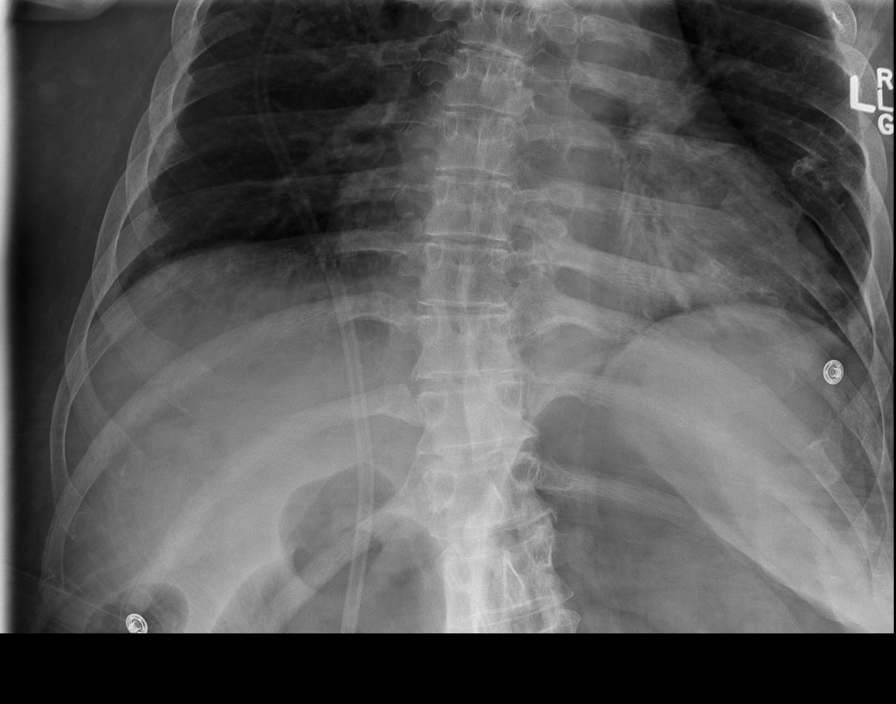

[t abdomen supine (2 of 4)]
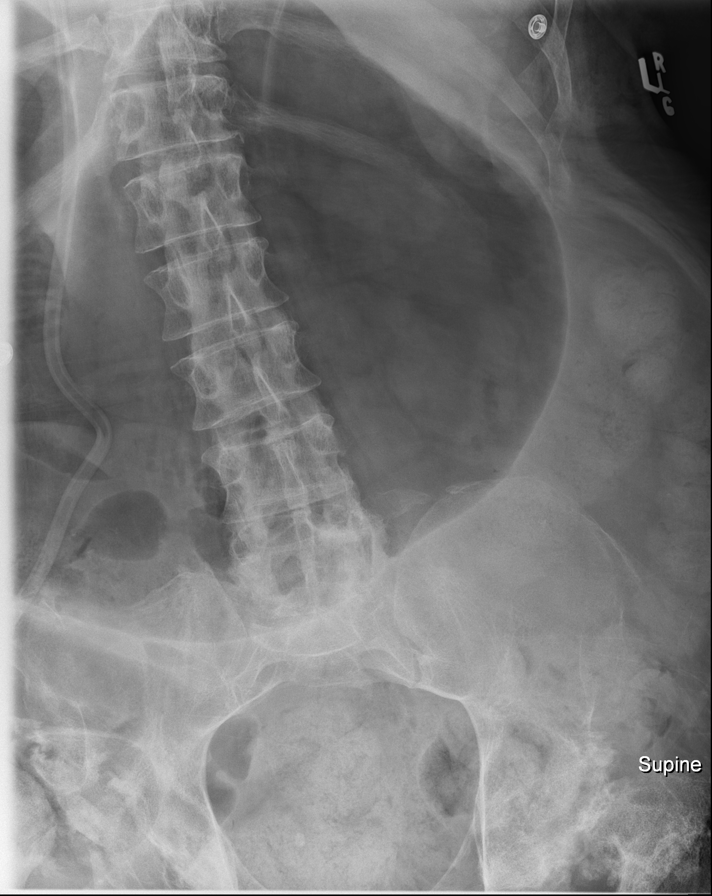

[t abdomen supine (3 of 4)]
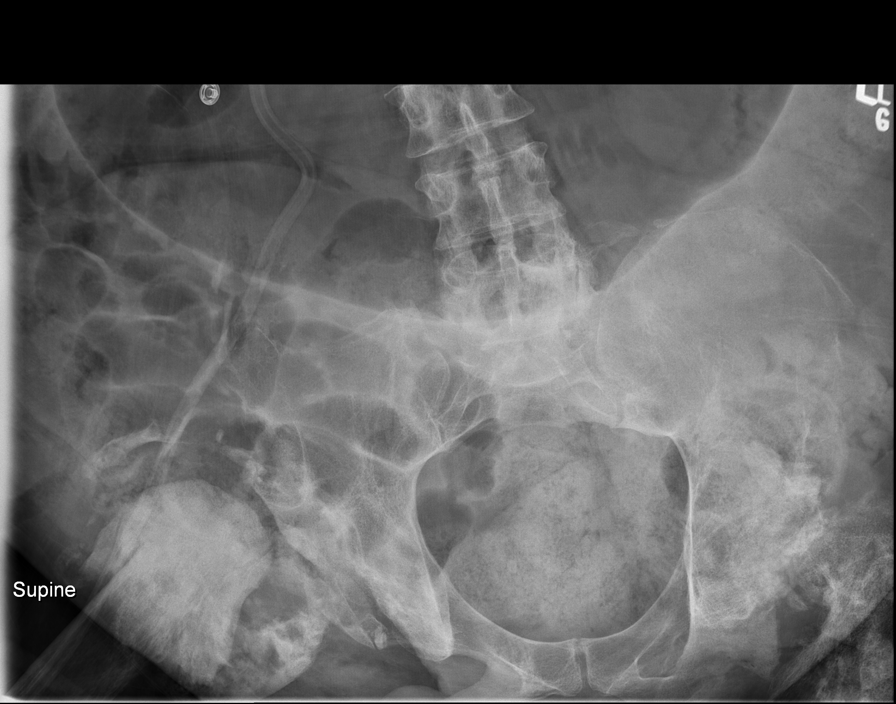

[t abdomen supine (4 of 4)]
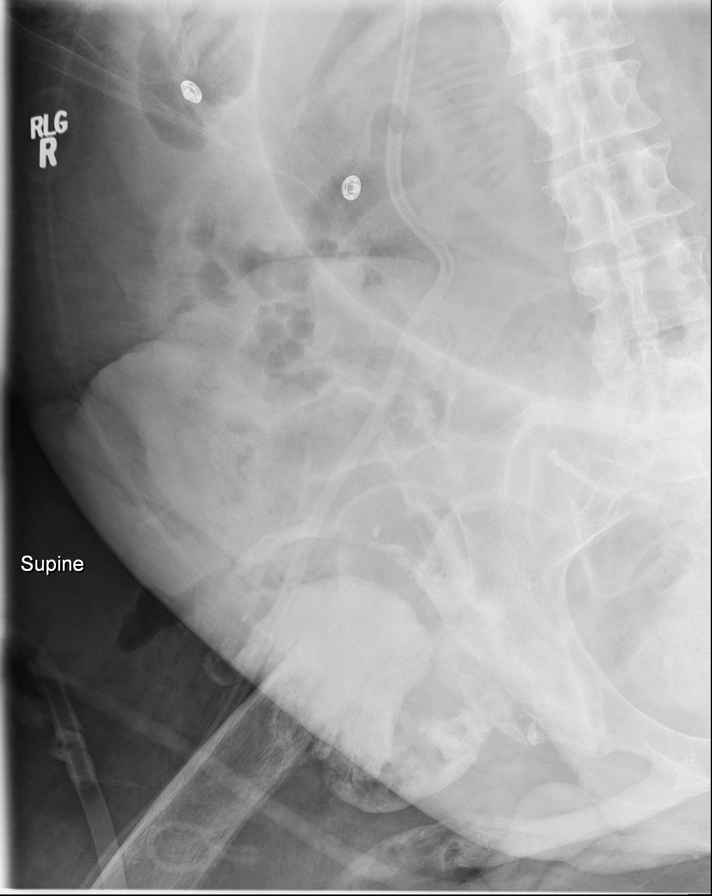

[w abdomen decub (1 of 2)]
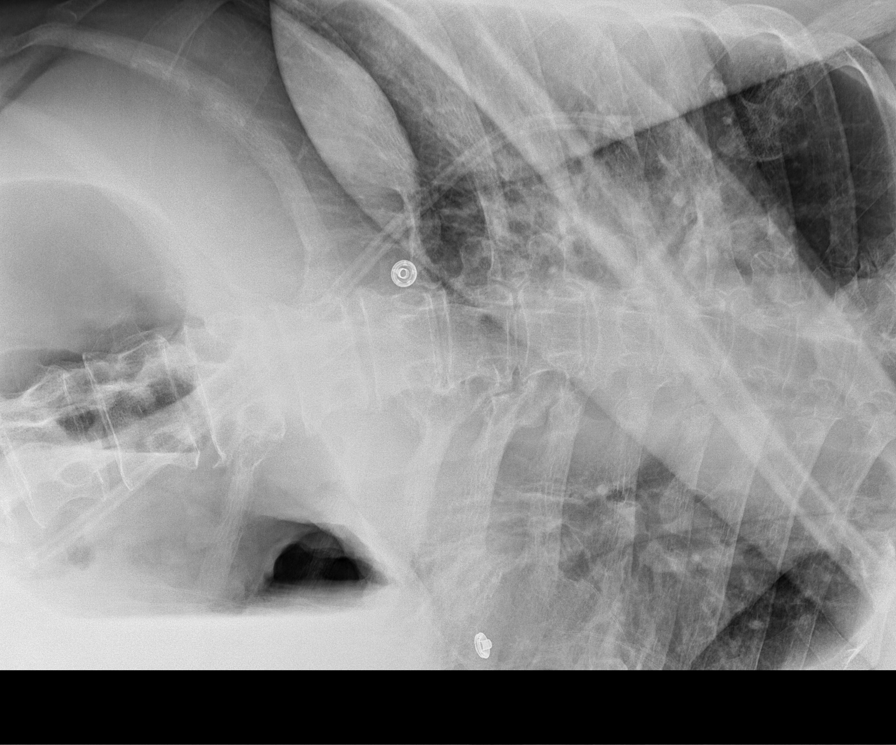

[w abdomen decub (2 of 2)]
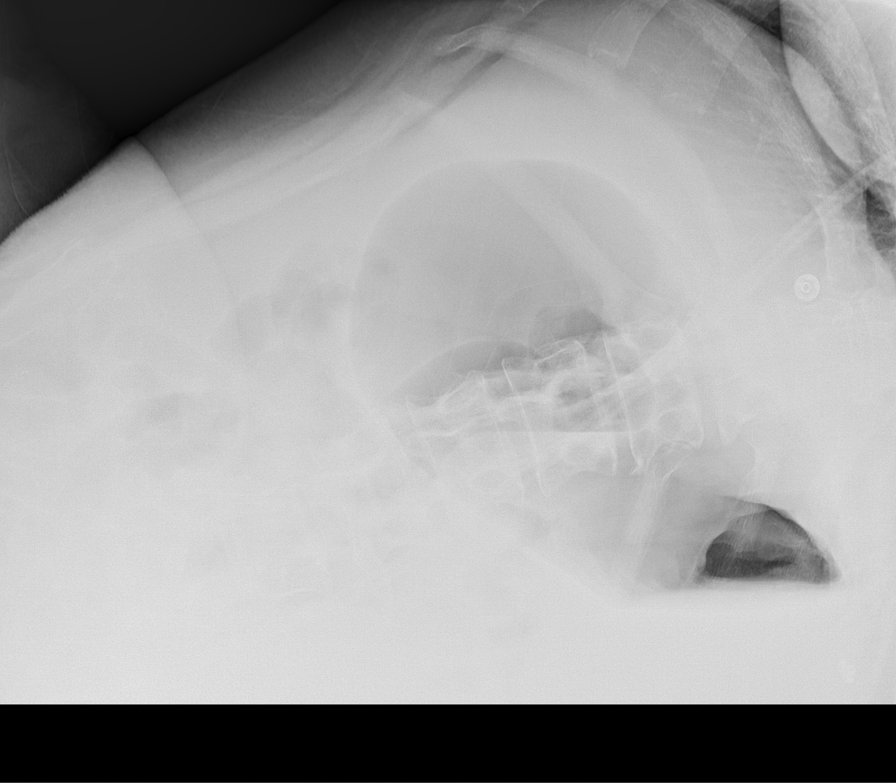

[8 of 9 positions shown; findings below may reference images not displayed]

FINDINGS: Stable cardiomegaly.  No acute pulmonary disease.

Severe gastric distention. Bowel gas is noted distal to the stomach.
These changes suggest gastric atony. Large amount of stool in
rectum. Right inguinal hernia cannot be excluded. No free air.
Catheter over the right chest and abdomen. Severe deformity both
hips again noted.
IMPRESSION: Severe gastric distention most consistent with gastric atony. NG
tube placement may prove useful. Large amount of stool in rectum.
These results will be called to the ordering clinician or
representative by the Radiologist Assistant, and communication
documented in the PACS or zVision Dashboard.

## 2016-11-30 IMAGING — DX DG ABD PORTABLE 1V
2 series · 3 of 3 positions shown · non-contrast
Comparison: 04/17/2014

CLINICAL DATA: Ileus, abdominal distension, UTI, sepsis gamma
gastritis, gastric ulcer, small bowel obstruction, quadriplegia

EXAM:
PORTABLE ABDOMEN - 1 VIEW

[Series 1: abdomen kub · 0.14mm/px · 2 of 2 slices shown (1 of 2)]
[im 1/2]
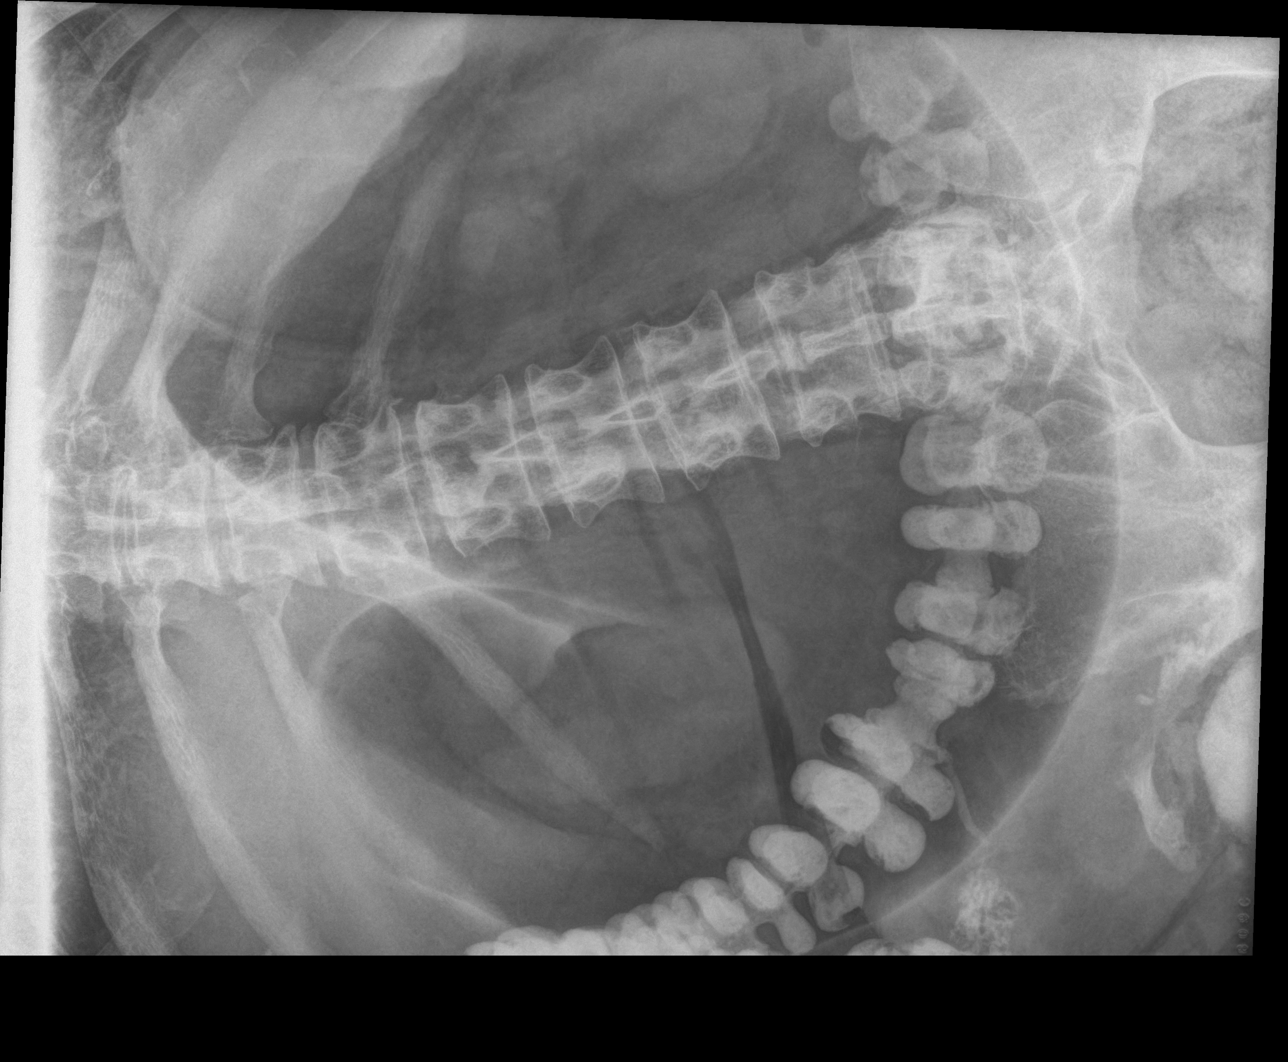
[im 2/2]
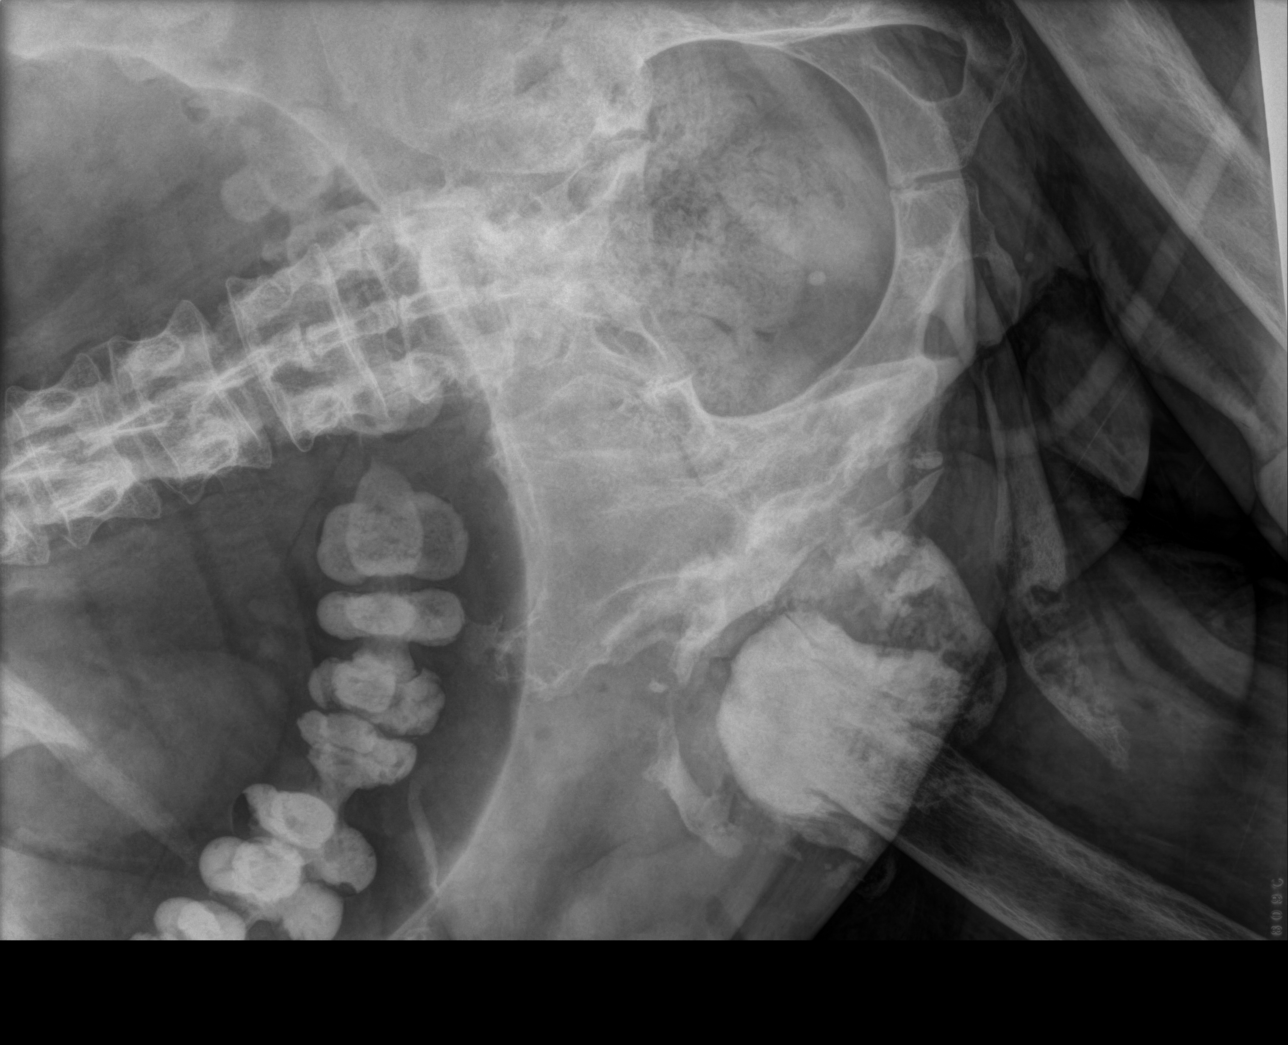

[abdomen kub (2 of 2)]
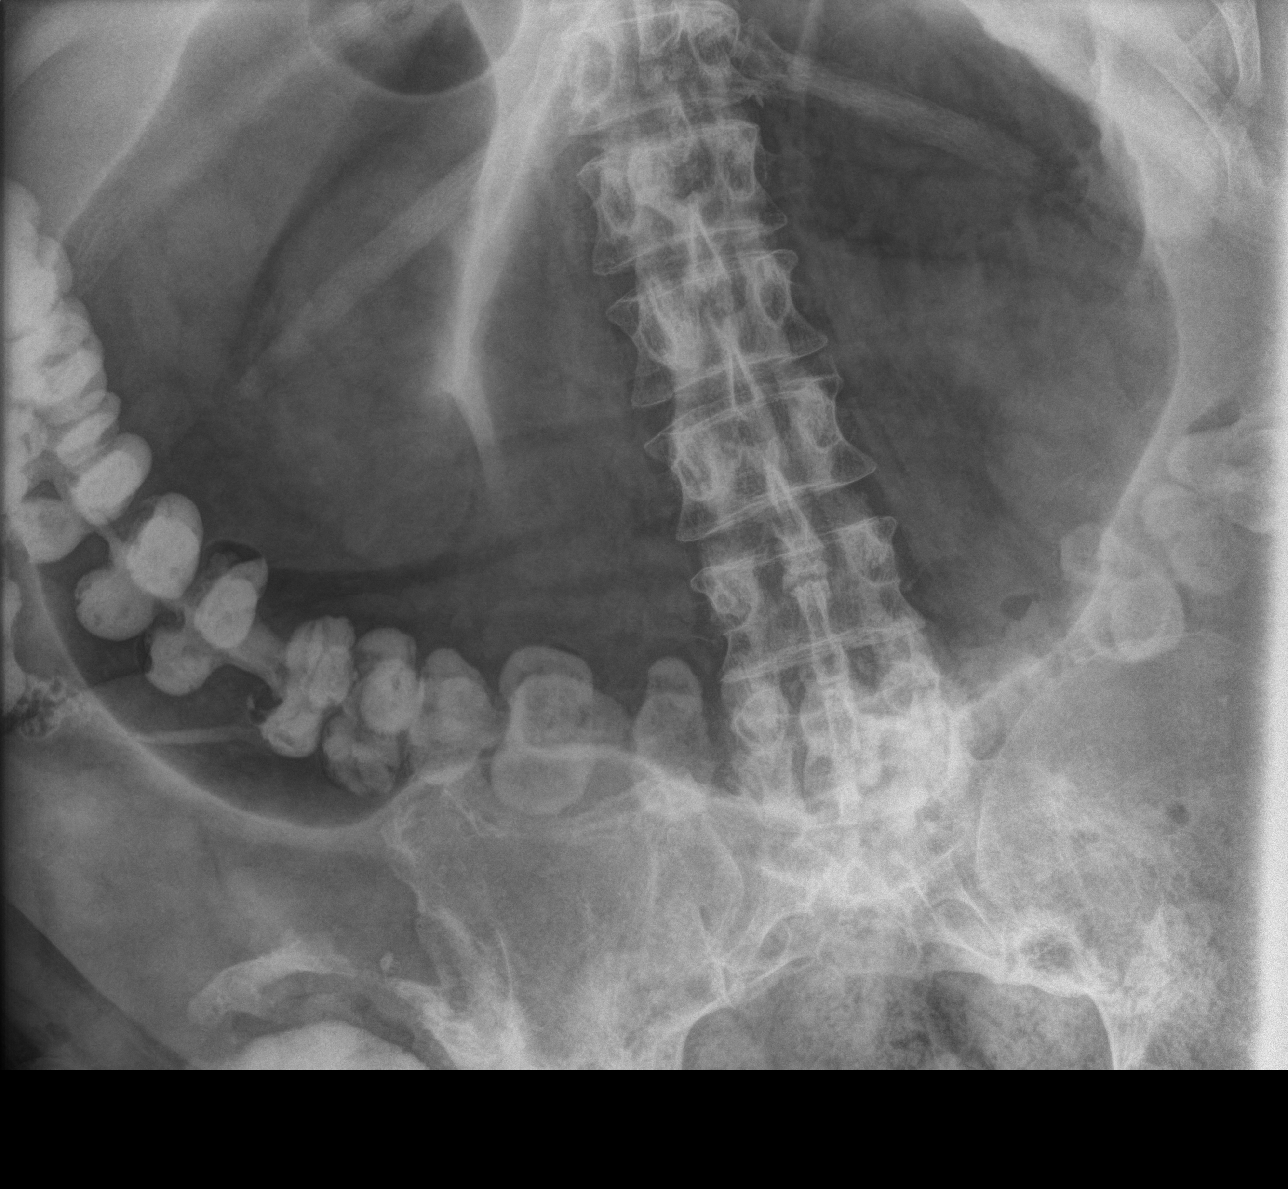

[3 of 3 positions shown; findings below may reference images not displayed]

FINDINGS: Persistent marked gaseous gastric distention.

However, GI contrast administered for CT of 04/17/2014 is now within
the colon indicating and absence of any significant degree of
gastric outlet obstruction, question gastroparesis.

Pattern of gaseous distention is similar to that seen on the
preceding CT and no evidence of gastric volvulus is identified on
that CT exam.

Stool in rectum.

Bones demineralized.

Chronic RIGHT hip dislocation with pseudarthrosis.
IMPRESSION: Marked gaseous distention of stomach similar to prior CT suggesting
component of gastroparesis, consider decompression.

## 2016-11-30 NOTE — Telephone Encounter (Signed)
How about Monday or Wednesday of next week at 1:45?

## 2016-11-30 NOTE — Telephone Encounter (Signed)
Patient scheduled for hospital follow up Wednesday 7/25 at 1:45 per Dr [name]. Relayed appointment information to [facility] at [facility]. Patient's IV antibiotic end date confirmed as 7/25.

## 2016-12-06 ENCOUNTER — Non-Acute Institutional Stay (SKILLED_NURSING_FACILITY): Payer: Medicare Other | Admitting: Adult Health

## 2016-12-06 ENCOUNTER — Encounter: Payer: Self-pay | Admitting: Adult Health

## 2016-12-06 DIAGNOSIS — K3184 Gastroparesis: Secondary | ICD-10-CM | POA: Diagnosis not present

## 2016-12-06 DIAGNOSIS — I10 Essential (primary) hypertension: Secondary | ICD-10-CM

## 2016-12-06 DIAGNOSIS — L89154 Pressure ulcer of sacral region, stage 4: Secondary | ICD-10-CM | POA: Diagnosis not present

## 2016-12-06 DIAGNOSIS — G8250 Quadriplegia, unspecified: Secondary | ICD-10-CM | POA: Diagnosis not present

## 2016-12-06 DIAGNOSIS — L89114 Pressure ulcer of right upper back, stage 4: Secondary | ICD-10-CM | POA: Diagnosis not present

## 2016-12-06 DIAGNOSIS — D509 Iron deficiency anemia, unspecified: Secondary | ICD-10-CM | POA: Diagnosis not present
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
## 2016-12-07 ENCOUNTER — Ambulatory Visit (INDEPENDENT_AMBULATORY_CARE_PROVIDER_SITE_OTHER): Payer: Medicare Other | Admitting: Infectious Diseases

## 2016-12-07 ENCOUNTER — Encounter: Payer: Self-pay | Admitting: Infectious Diseases

## 2016-12-07 DIAGNOSIS — L089 Local infection of the skin and subcutaneous tissue, unspecified: Secondary | ICD-10-CM

## 2016-12-07 DIAGNOSIS — T148XXA Other injury of unspecified body region, initial encounter: Secondary | ICD-10-CM | POA: Diagnosis not present
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
## 2016-12-07 NOTE — Assessment & Plan Note (Addendum)
Will stop his anbx, he has received > 4 weeks. There is no good oral transition.  He is getting good wound care, states he does not want to leave SNF til his wounds have further improved.  We will obtain his labs from SNF.  Will see him back in 4 weeks.

## 2016-12-13 ENCOUNTER — Encounter: Payer: Self-pay | Admitting: Adult Health

## 2016-12-13 ENCOUNTER — Non-Acute Institutional Stay (SKILLED_NURSING_FACILITY): Payer: Medicare Other | Admitting: Adult Health

## 2016-12-13 DIAGNOSIS — L89154 Pressure ulcer of sacral region, stage 4: Secondary | ICD-10-CM | POA: Diagnosis not present

## 2016-12-13 DIAGNOSIS — G8250 Quadriplegia, unspecified: Secondary | ICD-10-CM

## 2016-12-13 DIAGNOSIS — M4624 Osteomyelitis of vertebra, thoracic region: Secondary | ICD-10-CM

## 2016-12-13 DIAGNOSIS — L89119 Pressure ulcer of right upper back, unspecified stage: Secondary | ICD-10-CM | POA: Diagnosis not present

## 2016-12-13 DIAGNOSIS — L89613 Pressure ulcer of right heel, stage 3: Secondary | ICD-10-CM | POA: Diagnosis not present

## 2016-12-13 DIAGNOSIS — L89114 Pressure ulcer of right upper back, stage 4: Secondary | ICD-10-CM | POA: Diagnosis not present
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
## 2016-12-13 NOTE — Telephone Encounter (Signed)
No note in chart

## 2016-12-21 ENCOUNTER — Inpatient Hospital Stay: Payer: Medicare Other | Admitting: Infectious Diseases

## 2016-12-22 ENCOUNTER — Institutional Professional Consult (permissible substitution): Payer: Self-pay | Admitting: Neurology

## 2016-12-22 IMAGING — CR DG ABDOMEN ACUTE W/ 1V CHEST
6 series · 6 of 6 positions shown · non-contrast
Comparison: 04/18/2012, 04/17/2014

CLINICAL DATA: Nausea and vomiting for 2 days.

EXAM:
ACUTE ABDOMEN SERIES (ABDOMEN 2 VIEW & CHEST 1 VIEW)

[x chest ap (1 of 2)]
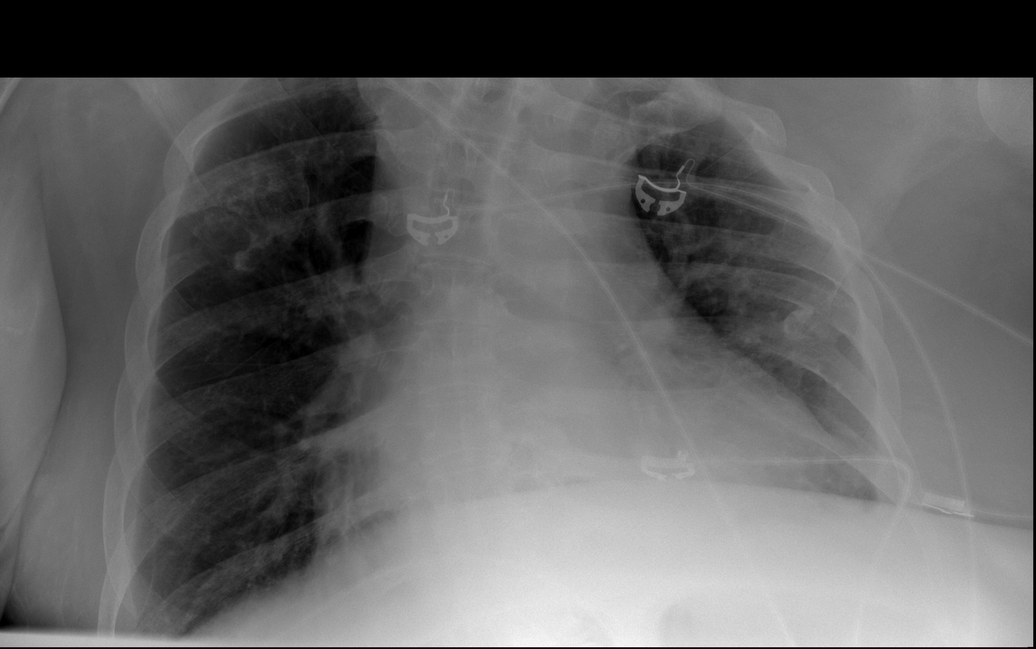

[x chest ap (2 of 2)]
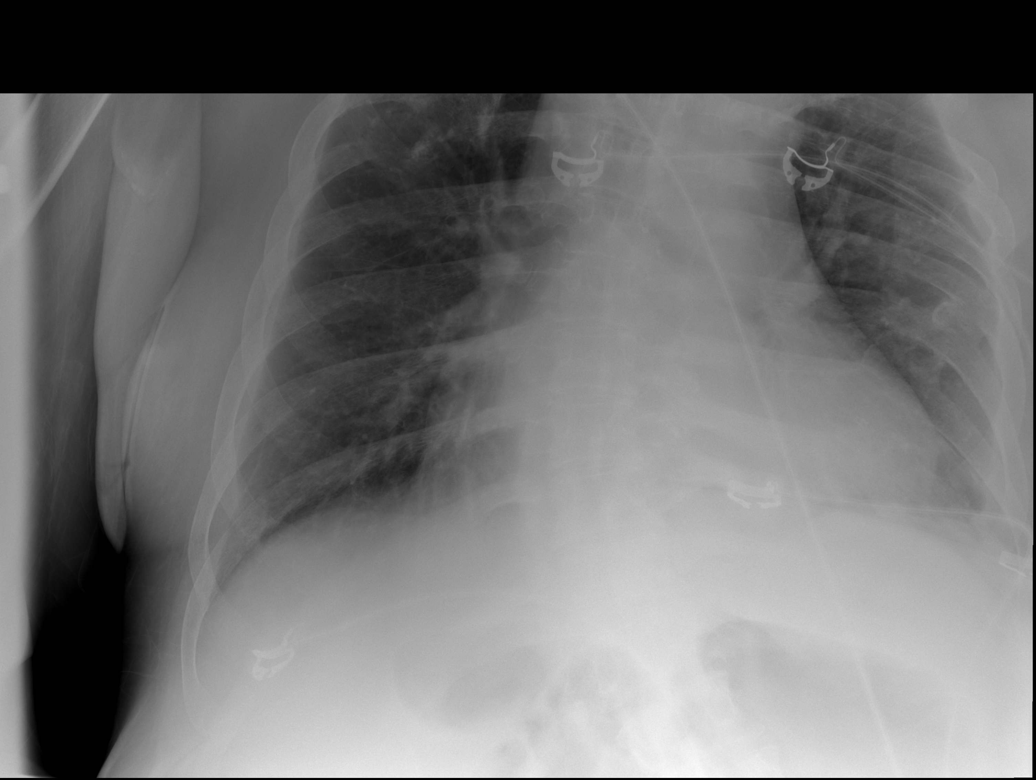

[w abdomen decub (1 of 2)]
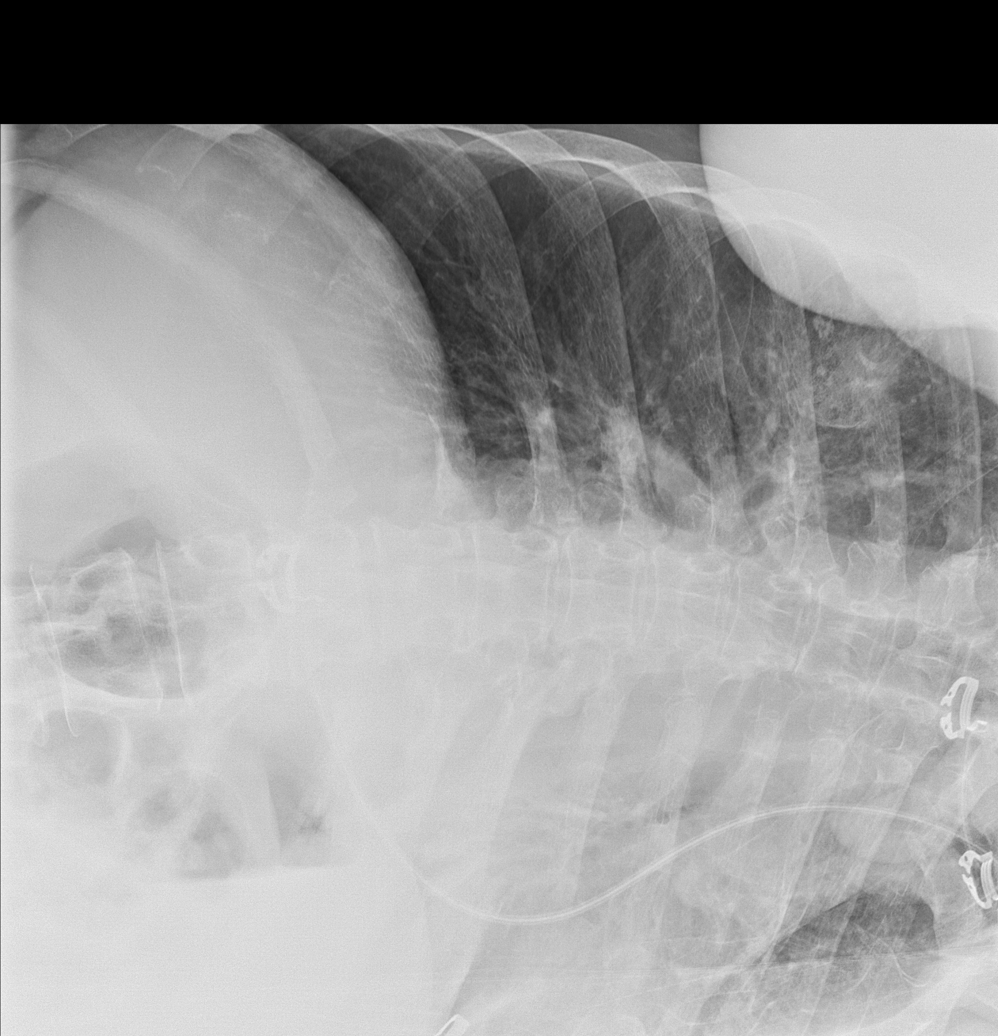

[w abdomen decub (2 of 2)]
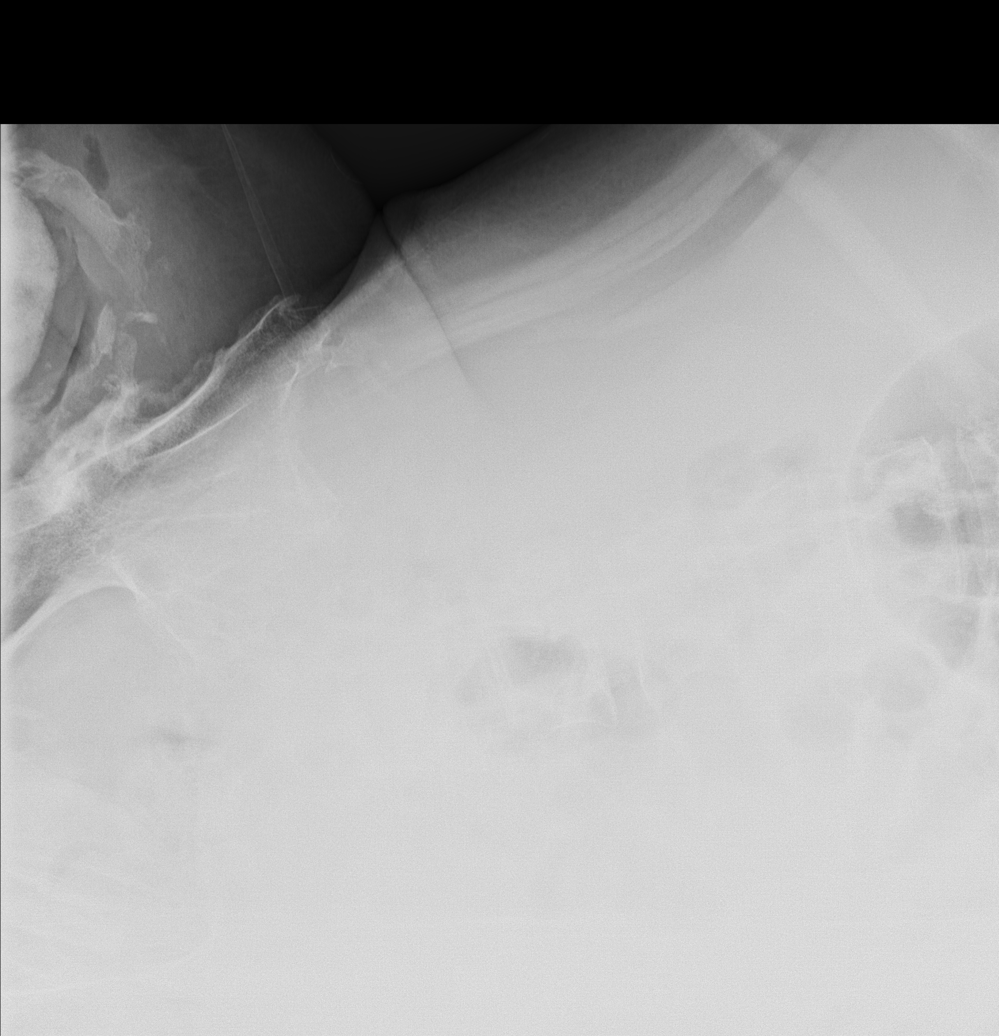

[x abdomen supine (1 of 2)]
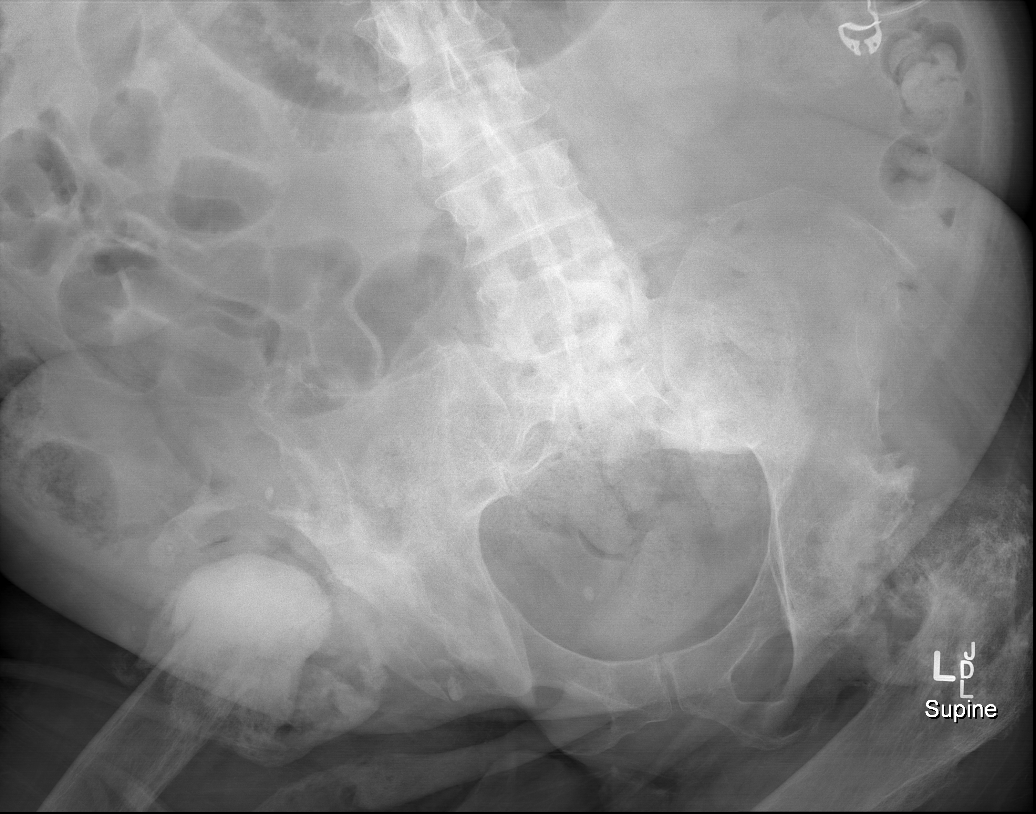

[x abdomen supine (2 of 2)]
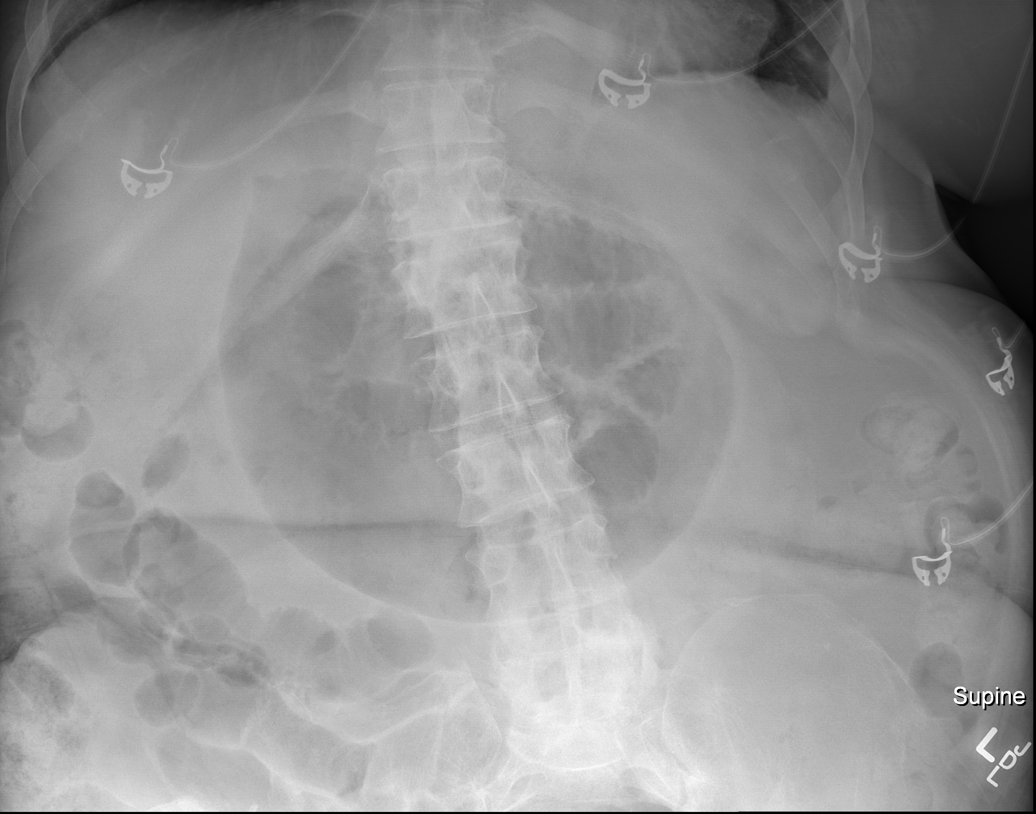

[6 of 6 positions shown; findings below may reference images not displayed]

FINDINGS: Stable cardiomegaly with vascular congestion and basilar atelectasis
versus scarring. Chronic rib deformities bilaterally overlie the
upper lobes. Trachea is midline. Exam is limited because of
positioning and body habitus.

No significant free air on the decubitus view. Scattered air and
stool throughout the bowel. Gastric distention noted although less
than the prior study of 04/17/2014

Chronic hip deformities and neuropathic changes.
IMPRESSION: Stable cardiomegaly with vascular congestion and basilar
atelectasis.

No gross bowel obstruction or free air

Stable chronic hip deformities/ neuropathic changes.

## 2016-12-23 ENCOUNTER — Other Ambulatory Visit: Payer: Medicare Other

## 2016-12-23 ENCOUNTER — Ambulatory Visit: Payer: Medicare Other

## 2017-01-02 ENCOUNTER — Encounter (HOSPITAL_BASED_OUTPATIENT_CLINIC_OR_DEPARTMENT_OTHER): Payer: Medicare Other | Attending: Internal Medicine

## 2017-01-02 DIAGNOSIS — L89890 Pressure ulcer of other site, unstageable: Secondary | ICD-10-CM | POA: Diagnosis not present

## 2017-01-02 DIAGNOSIS — I10 Essential (primary) hypertension: Secondary | ICD-10-CM | POA: Insufficient documentation

## 2017-01-02 DIAGNOSIS — L89154 Pressure ulcer of sacral region, stage 4: Secondary | ICD-10-CM | POA: Insufficient documentation

## 2017-01-02 DIAGNOSIS — L89612 Pressure ulcer of right heel, stage 2: Secondary | ICD-10-CM | POA: Diagnosis not present

## 2017-01-02 DIAGNOSIS — G8250 Quadriplegia, unspecified: Secondary | ICD-10-CM | POA: Insufficient documentation

## 2017-01-02 DIAGNOSIS — G4730 Sleep apnea, unspecified: Secondary | ICD-10-CM | POA: Insufficient documentation

## 2017-01-02 DIAGNOSIS — L89153 Pressure ulcer of sacral region, stage 3: Secondary | ICD-10-CM | POA: Diagnosis not present

## 2017-01-02 DIAGNOSIS — S71101A Unspecified open wound, right thigh, initial encounter: Secondary | ICD-10-CM | POA: Diagnosis not present

## 2017-01-02 DIAGNOSIS — S31109A Unspecified open wound of abdominal wall, unspecified quadrant without penetration into peritoneal cavity, initial encounter: Secondary | ICD-10-CM | POA: Diagnosis not present

## 2017-01-02 DIAGNOSIS — S91301A Unspecified open wound, right foot, initial encounter: Secondary | ICD-10-CM | POA: Diagnosis not present

## 2017-01-03 DIAGNOSIS — Z933 Colostomy status: Secondary | ICD-10-CM | POA: Diagnosis not present

## 2017-01-03 DIAGNOSIS — L89309 Pressure ulcer of unspecified buttock, unspecified stage: Secondary | ICD-10-CM | POA: Diagnosis not present

## 2017-01-03 DIAGNOSIS — S31809A Unspecified open wound of unspecified buttock, initial encounter: Secondary | ICD-10-CM | POA: Diagnosis not present

## 2017-01-03 DIAGNOSIS — S31102A Unspecified open wound of abdominal wall, epigastric region without penetration into peritoneal cavity, initial encounter: Secondary | ICD-10-CM | POA: Diagnosis not present

## 2017-01-03 DIAGNOSIS — L8944 Pressure ulcer of contiguous site of back, buttock and hip, stage 4: Secondary | ICD-10-CM | POA: Diagnosis not present

## 2017-01-04 DIAGNOSIS — L8944 Pressure ulcer of contiguous site of back, buttock and hip, stage 4: Secondary | ICD-10-CM | POA: Diagnosis not present

## 2017-01-04 DIAGNOSIS — S31102A Unspecified open wound of abdominal wall, epigastric region without penetration into peritoneal cavity, initial encounter: Secondary | ICD-10-CM | POA: Diagnosis not present

## 2017-01-04 DIAGNOSIS — Z933 Colostomy status: Secondary | ICD-10-CM | POA: Diagnosis not present

## 2017-01-04 DIAGNOSIS — S31809A Unspecified open wound of unspecified buttock, initial encounter: Secondary | ICD-10-CM | POA: Diagnosis not present

## 2017-01-19 DIAGNOSIS — Z933 Colostomy status: Secondary | ICD-10-CM | POA: Diagnosis not present

## 2017-01-19 DIAGNOSIS — S31809A Unspecified open wound of unspecified buttock, initial encounter: Secondary | ICD-10-CM | POA: Diagnosis not present

## 2017-01-19 DIAGNOSIS — L8944 Pressure ulcer of contiguous site of back, buttock and hip, stage 4: Secondary | ICD-10-CM | POA: Diagnosis not present

## 2017-01-19 DIAGNOSIS — S31102A Unspecified open wound of abdominal wall, epigastric region without penetration into peritoneal cavity, initial encounter: Secondary | ICD-10-CM | POA: Diagnosis not present

## 2017-01-20 ENCOUNTER — Ambulatory Visit: Payer: Medicare Other

## 2017-01-20 ENCOUNTER — Encounter (HOSPITAL_COMMUNITY): Payer: Self-pay | Admitting: Emergency Medicine

## 2017-01-20 ENCOUNTER — Other Ambulatory Visit: Payer: Self-pay | Admitting: Hematology

## 2017-01-20 ENCOUNTER — Other Ambulatory Visit: Payer: Medicare Other

## 2017-01-20 ENCOUNTER — Emergency Department (HOSPITAL_COMMUNITY)
Admission: EM | Admit: 2017-01-20 | Discharge: 2017-01-20 | Disposition: A | Discharge status: Home / Self Care | Payer: Medicare Other | Attending: Emergency Medicine | Admitting: Emergency Medicine

## 2017-01-20 ENCOUNTER — Encounter: Payer: Medicare Other | Admitting: Hematology

## 2017-01-20 DIAGNOSIS — T83511A Infection and inflammatory reaction due to indwelling urethral catheter, initial encounter: Secondary | ICD-10-CM | POA: Insufficient documentation

## 2017-01-20 DIAGNOSIS — I10 Essential (primary) hypertension: Secondary | ICD-10-CM | POA: Insufficient documentation

## 2017-01-20 DIAGNOSIS — N390 Urinary tract infection, site not specified: Secondary | ICD-10-CM | POA: Diagnosis not present

## 2017-01-20 DIAGNOSIS — G8250 Quadriplegia, unspecified: Secondary | ICD-10-CM | POA: Diagnosis not present

## 2017-01-20 DIAGNOSIS — R1084 Generalized abdominal pain: Secondary | ICD-10-CM | POA: Diagnosis not present

## 2017-01-20 DIAGNOSIS — R195 Other fecal abnormalities: Secondary | ICD-10-CM | POA: Diagnosis not present

## 2017-01-20 DIAGNOSIS — Z79899 Other long term (current) drug therapy: Secondary | ICD-10-CM | POA: Insufficient documentation

## 2017-01-20 DIAGNOSIS — G8220 Paraplegia, unspecified: Secondary | ICD-10-CM | POA: Diagnosis not present

## 2017-01-20 DIAGNOSIS — N3289 Other specified disorders of bladder: Secondary | ICD-10-CM | POA: Diagnosis present

## 2017-01-20 DIAGNOSIS — D638 Anemia in other chronic diseases classified elsewhere: Secondary | ICD-10-CM

## 2017-01-20 DIAGNOSIS — R109 Unspecified abdominal pain: Secondary | ICD-10-CM | POA: Diagnosis not present

## 2017-01-20 DIAGNOSIS — Y69 Unspecified misadventure during surgical and medical care: Secondary | ICD-10-CM | POA: Diagnosis not present

## 2017-01-20 DIAGNOSIS — K921 Melena: Secondary | ICD-10-CM | POA: Diagnosis not present

## 2017-01-20 LAB — URINALYSIS, ROUTINE W REFLEX MICROSCOPIC
Bilirubin Urine: NEGATIVE
GLUCOSE, UA: NEGATIVE mg/dL
KETONES UR: NEGATIVE mg/dL
NITRITE: NEGATIVE
PH: 6 (ref 5.0–8.0)
PROTEIN: 100 mg/dL — AB
Specific Gravity, Urine: 1.015 (ref 1.005–1.030)

## 2017-01-20 LAB — COMPREHENSIVE METABOLIC PANEL
ALT: 77 U/L — ABNORMAL HIGH (ref 17–63)
AST: 58 U/L — ABNORMAL HIGH (ref 15–41)
Albumin: 3.1 g/dL — ABNORMAL LOW (ref 3.5–5.0)
Alkaline Phosphatase: 111 U/L (ref 38–126)
Anion gap: 13 (ref 5–15)
BILIRUBIN TOTAL: 0.4 mg/dL (ref 0.3–1.2)
BUN: 21 mg/dL — AB (ref 6–20)
CHLORIDE: 105 mmol/L (ref 101–111)
CO2: 20 mmol/L — ABNORMAL LOW (ref 22–32)
CREATININE: 0.5 mg/dL — AB (ref 0.61–1.24)
Calcium: 9.1 mg/dL (ref 8.9–10.3)
Glucose, Bld: 91 mg/dL (ref 65–99)
POTASSIUM: 3.9 mmol/L (ref 3.5–5.1)
Sodium: 138 mmol/L (ref 135–145)
TOTAL PROTEIN: 8 g/dL (ref 6.5–8.1)

## 2017-01-20 LAB — TYPE AND SCREEN
ABO/RH(D): B POS
Antibody Screen: NEGATIVE

## 2017-01-20 LAB — CBC
HEMATOCRIT: 34.6 % — AB (ref 39.0–52.0)
Hemoglobin: 10.7 g/dL — ABNORMAL LOW (ref 13.0–17.0)
MCH: 25.2 pg — ABNORMAL LOW (ref 26.0–34.0)
MCHC: 30.9 g/dL (ref 30.0–36.0)
MCV: 81.6 fL (ref 78.0–100.0)
PLATELETS: 307 10*3/uL (ref 150–400)
RBC: 4.24 MIL/uL (ref 4.22–5.81)
RDW: 15.2 % (ref 11.5–15.5)
WBC: 6.9 10*3/uL (ref 4.0–10.5)

## 2017-01-20 LAB — POC OCCULT BLOOD, ED: Fecal Occult Bld: NEGATIVE

## 2017-01-20 MED ORDER — CIPROFLOXACIN HCL 500 MG PO TABS: 500.0000 mg | ORAL_TABLET | Freq: Two times a day (BID) | ORAL | 0 refills | Status: DC

## 2017-01-20 MED ORDER — CIPROFLOXACIN HCL 500 MG PO TABS
500.0000 mg | ORAL_TABLET | Freq: Once | ORAL | Status: AC
  Administered 2017-01-20: 500 mg via ORAL
  Filled 2017-01-20: qty 1

## 2017-01-20 NOTE — ED Triage Notes (Signed)
Pt arrives from home via [facility] c./o loose, dark stools x 1 week, foul smell to urine x 3 days.  Pt denies fever, chills, AMS.  Suprapubic cath in place, sediment noted on arrival in tubing and collection bag.  Pt AOx4, NAD noted at this time.

## 2017-01-20 NOTE — Discharge Instructions (Signed)
You blood levels today are ok. Your vitals signs have been stable though 8+ hours in the ED. I do no think you have a serious GI bleed. Continue to keep on eye on your stool. If it persists, worsens or you have shortness or breath, dizziness, etc then you need to be rechecked.  Your urine today is consistent with a UTI. It's hard to interpret these results in someone that has a indwelling catheter because they universally show bacteruria. Our decision to place on antibiotics is based more on symptoms. Your quadriplegia complicates this even more. With all the sediment in your urine and change in the odor, you are being placed on ciprofloxacin. Take until finished. Changing your catheter today should help as well. Follow-up with your urologist as needed or otherwise scheduled.
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
## 2017-01-20 NOTE — Progress Notes (Signed)
CM met with patient to discuss transitional care, patient denies being active with  [facility] services.  Patient presented today with c/o dark colored diarrhea and generalized weakness.  Discussed recommendations for [facility] services patient agreeable. Offered choice interim [facility] selected. CM verified contact info. Referral faxed to [facility] [phone] received fax confirmation. Patient verbalized understanding concerning transitional care plan teach back done. No further questions or concerns voices. Discussed that nurse will contact him 24- 48 hours post discharge from the ED. Patient provided contact information in case he should have additional question or concerns at another time.

## 2017-01-21 NOTE — Progress Notes (Signed)
This encounter was created in error - please disregard.

## 2017-01-22 LAB — URINE CULTURE

## 2017-01-23 ENCOUNTER — Encounter (HOSPITAL_BASED_OUTPATIENT_CLINIC_OR_DEPARTMENT_OTHER): Payer: Medicare Other | Attending: Internal Medicine

## 2017-01-23 ENCOUNTER — Other Ambulatory Visit (HOSPITAL_COMMUNITY)
Admission: RE | Admit: 2017-01-23 | Discharge: 2017-01-23 | Disposition: A | Discharge status: Home / Self Care | Payer: Medicare Other | Source: Other Acute Inpatient Hospital | Attending: Internal Medicine | Admitting: Internal Medicine

## 2017-01-23 ENCOUNTER — Telehealth: Payer: Self-pay | Admitting: Hematology

## 2017-01-23 DIAGNOSIS — L97519 Non-pressure chronic ulcer of other part of right foot with unspecified severity: Secondary | ICD-10-CM | POA: Insufficient documentation

## 2017-01-23 DIAGNOSIS — L89612 Pressure ulcer of right heel, stage 2: Secondary | ICD-10-CM | POA: Diagnosis not present

## 2017-01-23 DIAGNOSIS — G4730 Sleep apnea, unspecified: Secondary | ICD-10-CM | POA: Insufficient documentation

## 2017-01-23 DIAGNOSIS — G8250 Quadriplegia, unspecified: Secondary | ICD-10-CM | POA: Insufficient documentation

## 2017-01-23 DIAGNOSIS — S91301A Unspecified open wound, right foot, initial encounter: Secondary | ICD-10-CM | POA: Diagnosis not present

## 2017-01-23 DIAGNOSIS — L89892 Pressure ulcer of other site, stage 2: Secondary | ICD-10-CM | POA: Diagnosis not present

## 2017-01-23 DIAGNOSIS — I10 Essential (primary) hypertension: Secondary | ICD-10-CM | POA: Insufficient documentation

## 2017-01-23 DIAGNOSIS — L89154 Pressure ulcer of sacral region, stage 4: Secondary | ICD-10-CM | POA: Insufficient documentation

## 2017-01-23 DIAGNOSIS — L97211 Non-pressure chronic ulcer of right calf limited to breakdown of skin: Secondary | ICD-10-CM | POA: Diagnosis present

## 2017-01-23 NOTE — Telephone Encounter (Signed)
lvm to inform pt of r/s appts to 9/20 at 1245 per sch msg

## 2017-01-24 ENCOUNTER — Telehealth: Payer: Self-pay | Admitting: *Deleted

## 2017-01-24 DIAGNOSIS — L89309 Pressure ulcer of unspecified buttock, unspecified stage: Secondary | ICD-10-CM | POA: Diagnosis not present

## 2017-01-24 NOTE — Telephone Encounter (Signed)
Pt called regarding home health services arranged 9/7.  EDCM notes referral was faxed to [facility] office.  EDCM called [facility] office to find that they had NOT received referral but has talked with this pt and informed him that they can not accept him as a pt at this time.  EDCM informed pt tht we will continue to search for [facility] agencies in the area who is willing to accept this pt.  Pt verbalized understanding.  No further EDCM needs identified at this time.

## 2017-01-27 LAB — AEROBIC CULTURE  (SUPERFICIAL SPECIMEN)

## 2017-01-27 LAB — AEROBIC CULTURE W GRAM STAIN (SUPERFICIAL SPECIMEN)

## 2017-02-02 ENCOUNTER — Ambulatory Visit: Payer: Medicare Other

## 2017-02-02 ENCOUNTER — Ambulatory Visit: Payer: Medicare Other | Admitting: Hematology

## 2017-02-02 ENCOUNTER — Other Ambulatory Visit: Payer: Medicare Other

## 2017-02-02 DIAGNOSIS — R609 Edema, unspecified: Secondary | ICD-10-CM | POA: Diagnosis not present

## 2017-02-02 DIAGNOSIS — N390 Urinary tract infection, site not specified: Secondary | ICD-10-CM | POA: Diagnosis not present

## 2017-02-02 DIAGNOSIS — I10 Essential (primary) hypertension: Secondary | ICD-10-CM | POA: Diagnosis not present

## 2017-02-02 DIAGNOSIS — G8254 Quadriplegia, C5-C7 incomplete: Secondary | ICD-10-CM | POA: Diagnosis not present

## 2017-02-03 ENCOUNTER — Other Ambulatory Visit (HOSPITAL_BASED_OUTPATIENT_CLINIC_OR_DEPARTMENT_OTHER): Payer: Medicare Other

## 2017-02-03 ENCOUNTER — Ambulatory Visit: Payer: Medicare Other

## 2017-02-03 ENCOUNTER — Ambulatory Visit (HOSPITAL_BASED_OUTPATIENT_CLINIC_OR_DEPARTMENT_OTHER): Payer: Medicare Other | Admitting: Hematology

## 2017-02-03 ENCOUNTER — Telehealth: Payer: Self-pay | Admitting: Hematology

## 2017-02-03 VITALS — BP 97/80 | HR 108 | Temp 98.7°F | Resp 18

## 2017-02-03 DIAGNOSIS — D649 Anemia, unspecified: Secondary | ICD-10-CM

## 2017-02-03 DIAGNOSIS — M869 Osteomyelitis, unspecified: Secondary | ICD-10-CM

## 2017-02-03 DIAGNOSIS — D473 Essential (hemorrhagic) thrombocythemia: Secondary | ICD-10-CM | POA: Diagnosis not present

## 2017-02-03 DIAGNOSIS — D638 Anemia in other chronic diseases classified elsewhere: Secondary | ICD-10-CM

## 2017-02-03 DIAGNOSIS — D509 Iron deficiency anemia, unspecified: Secondary | ICD-10-CM

## 2017-02-03 LAB — IRON AND TIBC
%SAT: 13 % — ABNORMAL LOW (ref 20–55)
Iron: 25 ug/dL — ABNORMAL LOW (ref 42–163)
TIBC: 184 ug/dL — ABNORMAL LOW (ref 202–409)
UIBC: 159 ug/dL (ref 117–376)

## 2017-02-03 LAB — CBC WITH DIFFERENTIAL/PLATELET
BASO%: 1 % (ref 0.0–2.0)
BASOS ABS: 0.1 10*3/uL (ref 0.0–0.1)
EOS ABS: 0.5 10*3/uL (ref 0.0–0.5)
EOS%: 5.7 % (ref 0.0–7.0)
HCT: 35.9 % — ABNORMAL LOW (ref 38.4–49.9)
HGB: 11.6 g/dL — ABNORMAL LOW (ref 13.0–17.1)
LYMPH%: 17.8 % (ref 14.0–49.0)
MCH: 25.9 pg — AB (ref 27.2–33.4)
MCHC: 32.4 g/dL (ref 32.0–36.0)
MCV: 80 fL (ref 79.3–98.0)
MONO#: 0.9 10*3/uL (ref 0.1–0.9)
MONO%: 10 % (ref 0.0–14.0)
NEUT%: 65.5 % (ref 39.0–75.0)
NEUTROS ABS: 6 10*3/uL (ref 1.5–6.5)
PLATELETS: 346 10*3/uL (ref 140–400)
RBC: 4.49 10*6/uL (ref 4.20–5.82)
RDW: 15 % — ABNORMAL HIGH (ref 11.0–14.6)
WBC: 9.1 10*3/uL (ref 4.0–10.3)
lymph#: 1.6 10*3/uL (ref 0.9–3.3)

## 2017-02-03 LAB — FERRITIN
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
## 2017-02-03 NOTE — Telephone Encounter (Signed)
Gave patient AVS and calendar of upcoming October appointments °

## 2017-02-04 ENCOUNTER — Encounter: Payer: Self-pay | Admitting: Hematology

## 2017-02-05 ENCOUNTER — Emergency Department (HOSPITAL_COMMUNITY): Payer: Medicare Other

## 2017-02-05 ENCOUNTER — Encounter (HOSPITAL_COMMUNITY): Payer: Self-pay | Admitting: Radiology

## 2017-02-05 ENCOUNTER — Observation Stay (HOSPITAL_COMMUNITY)
Admission: EM | Admit: 2017-02-05 | Discharge: 2017-02-08 | Disposition: A | Discharge status: Home / Self Care | Payer: Medicare Other | Attending: Internal Medicine | Admitting: Internal Medicine

## 2017-02-05 DIAGNOSIS — Z87440 Personal history of urinary (tract) infections: Secondary | ICD-10-CM | POA: Insufficient documentation

## 2017-02-05 DIAGNOSIS — D509 Iron deficiency anemia, unspecified: Secondary | ICD-10-CM | POA: Insufficient documentation

## 2017-02-05 DIAGNOSIS — F329 Major depressive disorder, single episode, unspecified: Secondary | ICD-10-CM | POA: Diagnosis not present

## 2017-02-05 DIAGNOSIS — S12490S Other displaced fracture of fifth cervical vertebra, sequela: Secondary | ICD-10-CM | POA: Insufficient documentation

## 2017-02-05 DIAGNOSIS — M868X8 Other osteomyelitis, other site: Secondary | ICD-10-CM | POA: Insufficient documentation

## 2017-02-05 DIAGNOSIS — N281 Cyst of kidney, acquired: Secondary | ICD-10-CM | POA: Insufficient documentation

## 2017-02-05 DIAGNOSIS — G4733 Obstructive sleep apnea (adult) (pediatric): Secondary | ICD-10-CM | POA: Diagnosis not present

## 2017-02-05 DIAGNOSIS — E43 Unspecified severe protein-calorie malnutrition: Secondary | ICD-10-CM | POA: Diagnosis present

## 2017-02-05 DIAGNOSIS — N390 Urinary tract infection, site not specified: Secondary | ICD-10-CM | POA: Diagnosis not present

## 2017-02-05 DIAGNOSIS — E876 Hypokalemia: Secondary | ICD-10-CM | POA: Diagnosis not present

## 2017-02-05 DIAGNOSIS — I739 Peripheral vascular disease, unspecified: Secondary | ICD-10-CM | POA: Diagnosis not present

## 2017-02-05 DIAGNOSIS — Z981 Arthrodesis status: Secondary | ICD-10-CM | POA: Insufficient documentation

## 2017-02-05 DIAGNOSIS — K3184 Gastroparesis: Secondary | ICD-10-CM | POA: Diagnosis not present

## 2017-02-05 DIAGNOSIS — Z8719 Personal history of other diseases of the digestive system: Secondary | ICD-10-CM | POA: Insufficient documentation

## 2017-02-05 DIAGNOSIS — R1110 Vomiting, unspecified: Secondary | ICD-10-CM | POA: Diagnosis not present

## 2017-02-05 DIAGNOSIS — Z881 Allergy status to other antibiotic agents status: Secondary | ICD-10-CM | POA: Diagnosis not present

## 2017-02-05 DIAGNOSIS — K920 Hematemesis: Secondary | ICD-10-CM | POA: Diagnosis not present

## 2017-02-05 DIAGNOSIS — G40909 Epilepsy, unspecified, not intractable, without status epilepticus: Secondary | ICD-10-CM | POA: Insufficient documentation

## 2017-02-05 DIAGNOSIS — D508 Other iron deficiency anemias: Secondary | ICD-10-CM | POA: Diagnosis not present

## 2017-02-05 DIAGNOSIS — K921 Melena: Secondary | ICD-10-CM | POA: Diagnosis not present

## 2017-02-05 DIAGNOSIS — Z933 Colostomy status: Secondary | ICD-10-CM | POA: Diagnosis not present

## 2017-02-05 DIAGNOSIS — J9811 Atelectasis: Secondary | ICD-10-CM | POA: Insufficient documentation

## 2017-02-05 DIAGNOSIS — G8250 Quadriplegia, unspecified: Secondary | ICD-10-CM

## 2017-02-05 DIAGNOSIS — Z888 Allergy status to other drugs, medicaments and biological substances status: Secondary | ICD-10-CM | POA: Insufficient documentation

## 2017-02-05 DIAGNOSIS — K591 Functional diarrhea: Secondary | ICD-10-CM | POA: Diagnosis not present

## 2017-02-05 DIAGNOSIS — K449 Diaphragmatic hernia without obstruction or gangrene: Secondary | ICD-10-CM | POA: Insufficient documentation

## 2017-02-05 DIAGNOSIS — Z9989 Dependence on other enabling machines and devices: Secondary | ICD-10-CM | POA: Insufficient documentation

## 2017-02-05 DIAGNOSIS — L89154 Pressure ulcer of sacral region, stage 4: Secondary | ICD-10-CM | POA: Insufficient documentation

## 2017-02-05 DIAGNOSIS — D500 Iron deficiency anemia secondary to blood loss (chronic): Secondary | ICD-10-CM | POA: Diagnosis not present

## 2017-02-05 DIAGNOSIS — K922 Gastrointestinal hemorrhage, unspecified: Secondary | ICD-10-CM | POA: Diagnosis not present

## 2017-02-05 DIAGNOSIS — I10 Essential (primary) hypertension: Secondary | ICD-10-CM | POA: Diagnosis not present

## 2017-02-05 DIAGNOSIS — Z79899 Other long term (current) drug therapy: Secondary | ICD-10-CM | POA: Insufficient documentation

## 2017-02-05 DIAGNOSIS — Z9359 Other cystostomy status: Secondary | ICD-10-CM

## 2017-02-05 DIAGNOSIS — Z23 Encounter for immunization: Secondary | ICD-10-CM | POA: Diagnosis not present

## 2017-02-05 DIAGNOSIS — K219 Gastro-esophageal reflux disease without esophagitis: Secondary | ICD-10-CM | POA: Diagnosis not present

## 2017-02-05 DIAGNOSIS — E669 Obesity, unspecified: Secondary | ICD-10-CM | POA: Diagnosis present

## 2017-02-05 LAB — URINALYSIS, ROUTINE W REFLEX MICROSCOPIC
Glucose, UA: NEGATIVE mg/dL
Ketones, ur: NEGATIVE mg/dL
Nitrite: NEGATIVE
Protein, ur: 100 mg/dL — AB
Specific Gravity, Urine: 1.015 (ref 1.005–1.030)
pH: 7.5 (ref 5.0–8.0)

## 2017-02-05 LAB — CBC WITH DIFFERENTIAL/PLATELET
Basophils Absolute: 0 10*3/uL (ref 0.0–0.1)
Basophils Relative: 0 %
Eosinophils Absolute: 0.1 10*3/uL (ref 0.0–0.7)
Eosinophils Relative: 1 %
HCT: 34.4 % — ABNORMAL LOW (ref 39.0–52.0)
Hemoglobin: 10.7 g/dL — ABNORMAL LOW (ref 13.0–17.0)
Lymphocytes Relative: 10 %
Lymphs Abs: 1.3 10*3/uL (ref 0.7–4.0)
MCH: 25.6 pg — ABNORMAL LOW (ref 26.0–34.0)
MCHC: 31.1 g/dL (ref 30.0–36.0)
MCV: 82.3 fL (ref 78.0–100.0)
Monocytes Absolute: 1.6 10*3/uL — ABNORMAL HIGH (ref 0.1–1.0)
Monocytes Relative: 12 %
Neutro Abs: 10.4 10*3/uL — ABNORMAL HIGH (ref 1.7–7.7)
Neutrophils Relative %: 77 %
Platelets: 393 10*3/uL (ref 150–400)
RBC: 4.18 MIL/uL — ABNORMAL LOW (ref 4.22–5.81)
RDW: 14.9 % (ref 11.5–15.5)
WBC: 13.5 10*3/uL — ABNORMAL HIGH (ref 4.0–10.5)

## 2017-02-05 LAB — COMPREHENSIVE METABOLIC PANEL
ALT: 47 U/L (ref 17–63)
AST: 36 U/L (ref 15–41)
Albumin: 3.3 g/dL — ABNORMAL LOW (ref 3.5–5.0)
Alkaline Phosphatase: 92 U/L (ref 38–126)
Anion gap: 12 (ref 5–15)
BUN: 37 mg/dL — ABNORMAL HIGH (ref 6–20)
CHLORIDE: 107 mmol/L (ref 101–111)
CO2: 21 mmol/L — ABNORMAL LOW (ref 22–32)
Calcium: 9.2 mg/dL (ref 8.9–10.3)
Creatinine, Ser: 0.7 mg/dL (ref 0.61–1.24)
Glucose, Bld: 94 mg/dL (ref 65–99)
POTASSIUM: 3.2 mmol/L — AB (ref 3.5–5.1)
Sodium: 140 mmol/L (ref 135–145)
Total Bilirubin: 0.4 mg/dL (ref 0.3–1.2)
Total Protein: 8.4 g/dL — ABNORMAL HIGH (ref 6.5–8.1)

## 2017-02-05 LAB — TYPE AND SCREEN
ABO/RH(D): B POS
Antibody Screen: NEGATIVE

## 2017-02-05 LAB — URINALYSIS, MICROSCOPIC (REFLEX)

## 2017-02-05 LAB — C DIFFICILE QUICK SCREEN W PCR REFLEX
C DIFFICLE (CDIFF) ANTIGEN: NEGATIVE
C Diff interpretation: NOT DETECTED
C Diff toxin: NEGATIVE

## 2017-02-05 LAB — POC OCCULT BLOOD, ED: Fecal Occult Bld: POSITIVE — AB

## 2017-02-05 MED ORDER — SODIUM CHLORIDE 0.9% FLUSH
10.0000 mL | INTRAVENOUS | Status: DC | PRN
  Administered 2017-02-08: 10 mL
  Filled 2017-02-05: qty 40

## 2017-02-05 MED ORDER — INFLUENZA VAC SPLIT QUAD 0.5 ML IM SUSY
0.5000 mL | PREFILLED_SYRINGE | INTRAMUSCULAR | Status: AC
  Administered 2017-02-06: 0.5 mL via INTRAMUSCULAR
  Filled 2017-02-05: qty 0.5

## 2017-02-05 MED ORDER — ACETAMINOPHEN 325 MG PO TABS
650.0000 mg | ORAL_TABLET | Freq: Four times a day (QID) | ORAL | Status: DC | PRN
  Administered 2017-02-07: 650 mg via ORAL
  Filled 2017-02-05: qty 2

## 2017-02-05 MED ORDER — ONDANSETRON HCL 4 MG/2ML IJ SOLN
4.0000 mg | Freq: Once | INTRAMUSCULAR | Status: AC
  Administered 2017-02-05: 4 mg via INTRAVENOUS
  Filled 2017-02-05: qty 2

## 2017-02-05 MED ORDER — DOCUSATE SODIUM 100 MG PO CAPS
200.0000 mg | ORAL_CAPSULE | Freq: Two times a day (BID) | ORAL | Status: DC
  Administered 2017-02-05 – 2017-02-08 (×6): 200 mg via ORAL
  Filled 2017-02-05 (×6): qty 2

## 2017-02-05 MED ORDER — ADULT MULTIVITAMIN W/MINERALS CH
1.0000 | ORAL_TABLET | Freq: Every morning | ORAL | Status: DC
  Administered 2017-02-06 – 2017-02-08 (×3): 1 via ORAL
  Filled 2017-02-05 (×3): qty 1

## 2017-02-05 MED ORDER — METOCLOPRAMIDE HCL 10 MG PO TABS
10.0000 mg | ORAL_TABLET | Freq: Four times a day (QID) | ORAL | Status: DC
  Administered 2017-02-05 – 2017-02-07 (×7): 10 mg via ORAL
  Filled 2017-02-05 (×7): qty 1

## 2017-02-05 MED ORDER — SODIUM CHLORIDE 0.9 % IV BOLUS (SEPSIS)
1000.0000 mL | Freq: Once | INTRAVENOUS | Status: AC
  Administered 2017-02-05: 1000 mL via INTRAVENOUS

## 2017-02-05 MED ORDER — ZINC 50 MG PO TABS: 50.0000 mg | ORAL_TABLET | Freq: Two times a day (BID) | ORAL | Status: DC

## 2017-02-05 MED ORDER — CARRASYN V WOUND DRESSING EX GEL: CUTANEOUS | Status: DC | PRN

## 2017-02-05 MED ORDER — METOCLOPRAMIDE HCL 5 MG/ML IJ SOLN
10.0000 mg | Freq: Once | INTRAMUSCULAR | Status: AC
  Administered 2017-02-05: 10 mg via INTRAVENOUS
  Filled 2017-02-05: qty 2

## 2017-02-05 MED ORDER — SUCRALFATE 1 G PO TABS
1.0000 g | ORAL_TABLET | Freq: Four times a day (QID) | ORAL | Status: DC
  Administered 2017-02-05 – 2017-02-08 (×11): 1 g via ORAL
  Filled 2017-02-05 (×11): qty 1

## 2017-02-05 MED ORDER — VITAMIN C 500 MG PO TABS
500.0000 mg | ORAL_TABLET | Freq: Every morning | ORAL | Status: DC
  Administered 2017-02-06 – 2017-02-08 (×3): 500 mg via ORAL
  Filled 2017-02-05 (×3): qty 1

## 2017-02-05 MED ORDER — IOPAMIDOL (ISOVUE-300) INJECTION 61%
INTRAVENOUS | Status: AC
  Administered 2017-02-05: 100 mL via INTRAVENOUS
  Filled 2017-02-05: qty 100

## 2017-02-05 MED ORDER — MAGNESIUM OXIDE 400 (241.3 MG) MG PO TABS
400.0000 mg | ORAL_TABLET | Freq: Every day | ORAL | Status: DC
  Administered 2017-02-05 – 2017-02-08 (×4): 400 mg via ORAL
  Filled 2017-02-05 (×4): qty 1

## 2017-02-05 MED ORDER — PRO-STAT SUGAR FREE PO LIQD
30.0000 mL | Freq: Three times a day (TID) | ORAL | Status: DC
  Administered 2017-02-06 – 2017-02-08 (×2): 30 mL via ORAL
  Filled 2017-02-05 (×4): qty 30

## 2017-02-05 MED ORDER — DARIFENACIN HYDROBROMIDE ER 7.5 MG PO TB24: 7.5000 mg | ORAL_TABLET | Freq: Every day | ORAL | Status: DC

## 2017-02-05 MED ORDER — BACLOFEN 20 MG PO TABS: 20.0000 mg | ORAL_TABLET | Freq: Four times a day (QID) | ORAL | Status: DC

## 2017-02-05 MED ORDER — POTASSIUM CHLORIDE CRYS ER 20 MEQ PO TBCR
20.0000 meq | EXTENDED_RELEASE_TABLET | Freq: Two times a day (BID) | ORAL | Status: DC
  Administered 2017-02-05 – 2017-02-08 (×6): 20 meq via ORAL
  Filled 2017-02-05 (×6): qty 1

## 2017-02-05 MED ORDER — ONDANSETRON HCL 4 MG PO TABS: 4.0000 mg | ORAL_TABLET | Freq: Four times a day (QID) | ORAL | Status: DC | PRN

## 2017-02-05 MED ORDER — FUROSEMIDE 20 MG PO TABS: 20.0000 mg | ORAL_TABLET | Freq: Two times a day (BID) | ORAL | Status: DC | PRN

## 2017-02-05 MED ORDER — ONDANSETRON HCL 4 MG/2ML IJ SOLN
4.0000 mg | Freq: Four times a day (QID) | INTRAMUSCULAR | Status: DC | PRN
  Administered 2017-02-06 – 2017-02-08 (×2): 4 mg via INTRAVENOUS
  Filled 2017-02-05 (×2): qty 2

## 2017-02-05 MED ORDER — ACETAMINOPHEN 650 MG RE SUPP: 650.0000 mg | Freq: Four times a day (QID) | RECTAL | Status: DC | PRN

## 2017-02-05 MED ORDER — FESOTERODINE FUMARATE ER 8 MG PO TB24
8.0000 mg | ORAL_TABLET | Freq: Every day | ORAL | Status: DC
  Administered 2017-02-05 – 2017-02-08 (×4): 8 mg via ORAL
  Filled 2017-02-05 (×4): qty 1

## 2017-02-05 MED ORDER — SODIUM CHLORIDE 0.9 % IV SOLN
INTRAVENOUS | Status: DC
  Administered 2017-02-05 – 2017-02-07 (×3): via INTRAVENOUS

## 2017-02-05 MED ORDER — PANTOPRAZOLE SODIUM 40 MG IV SOLR
40.0000 mg | Freq: Two times a day (BID) | INTRAVENOUS | Status: DC
  Administered 2017-02-05 – 2017-02-07 (×4): 40 mg via INTRAVENOUS
  Filled 2017-02-05 (×4): qty 40

## 2017-02-05 NOTE — ED Notes (Signed)
Call report to [facility] [phone] @ [phone]

## 2017-02-05 NOTE — ED Notes (Signed)
Bed: WA19 Expected date:  Expected time:  Means of arrival:  Comments: 
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
## 2017-02-05 NOTE — Progress Notes (Signed)
Pt has multiple wounds present upon assessment. Home drsgs removed to assess wounds. Wounds assessed by D. Feliciana Narayan,RN & Dorothyann Gibbs, RN.  New drsgs applied & WOC consult entered. Stacey Drain
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
## 2017-02-05 NOTE — ED Triage Notes (Signed)
Per EMS, pt presents from group home with complaints of black, tarry stool and vomiting since Friday. Has not eaten anything since Friday. Says he has felt bad since his med change last Monday for his bladder spasms.
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
## 2017-02-06 ENCOUNTER — Encounter (HOSPITAL_COMMUNITY): Payer: Self-pay | Admitting: Gastroenterology

## 2017-02-06 DIAGNOSIS — K3184 Gastroparesis: Secondary | ICD-10-CM | POA: Diagnosis not present

## 2017-02-06 DIAGNOSIS — E876 Hypokalemia: Secondary | ICD-10-CM

## 2017-02-06 DIAGNOSIS — K922 Gastrointestinal hemorrhage, unspecified: Secondary | ICD-10-CM | POA: Diagnosis not present

## 2017-02-06 DIAGNOSIS — K920 Hematemesis: Secondary | ICD-10-CM | POA: Diagnosis not present

## 2017-02-06 LAB — BASIC METABOLIC PANEL
ANION GAP: 8 (ref 5–15)
BUN: 32 mg/dL — ABNORMAL HIGH (ref 6–20)
CALCIUM: 8.9 mg/dL (ref 8.9–10.3)
CHLORIDE: 110 mmol/L (ref 101–111)
CO2: 22 mmol/L (ref 22–32)
Creatinine, Ser: 0.68 mg/dL (ref 0.61–1.24)
Glucose, Bld: 116 mg/dL — ABNORMAL HIGH (ref 65–99)
Potassium: 3.5 mmol/L (ref 3.5–5.1)
Sodium: 140 mmol/L (ref 135–145)

## 2017-02-06 LAB — HEMOGLOBIN AND HEMATOCRIT, BLOOD
HCT: 31.2 % — ABNORMAL LOW (ref 39.0–52.0)
HEMATOCRIT: 32.6 % — AB (ref 39.0–52.0)
HEMOGLOBIN: 9.8 g/dL — AB (ref 13.0–17.0)
Hemoglobin: 10.1 g/dL — ABNORMAL LOW (ref 13.0–17.0)

## 2017-02-06 LAB — MRSA PCR SCREENING: MRSA by PCR: NEGATIVE

## 2017-02-06 IMAGING — US US RENAL
1 series · 14 of 25 positions shown · non-contrast
Comparison: CT abdomen/ pelvis 03/10/2016

CLINICAL DATA: Acute kidney injury.

EXAM:
RENAL / URINARY TRACT ULTRASOUND COMPLETE

[Series 1: us renal · 0.34mm/px · 14 of 33 slices shown]
[im 1/33]
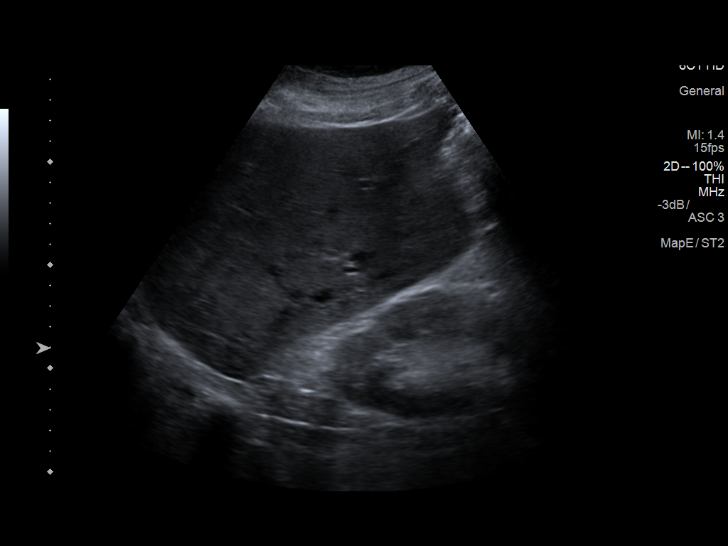
[im 3/33]
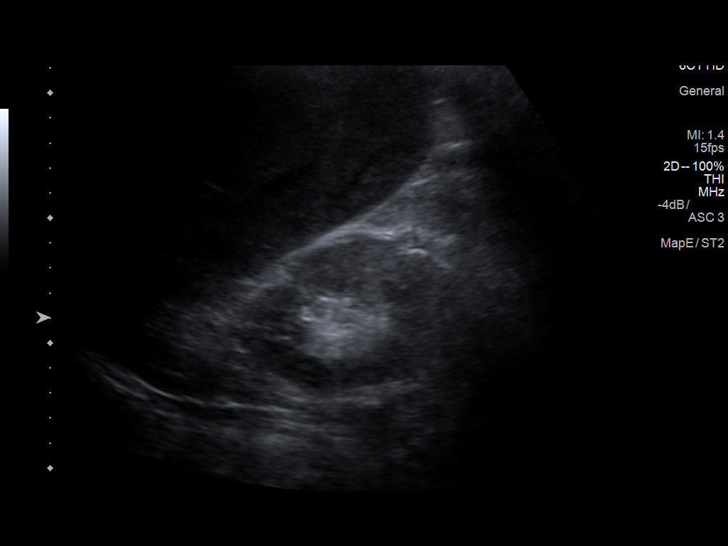
[im 6/33]
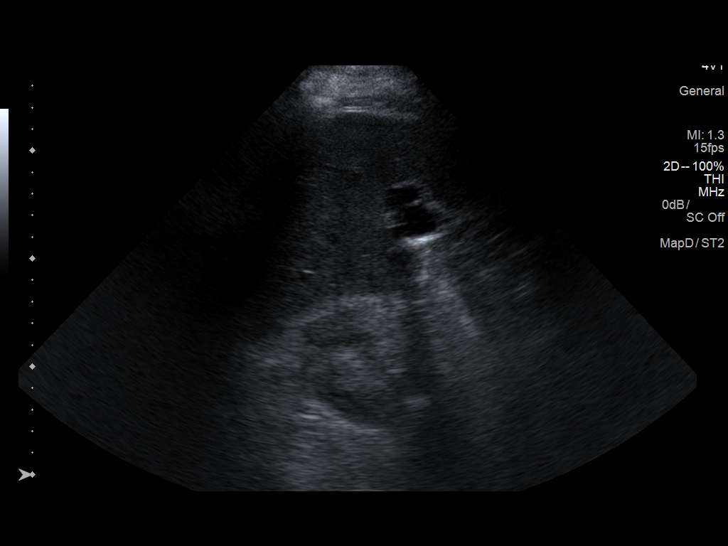
[im 9/33]
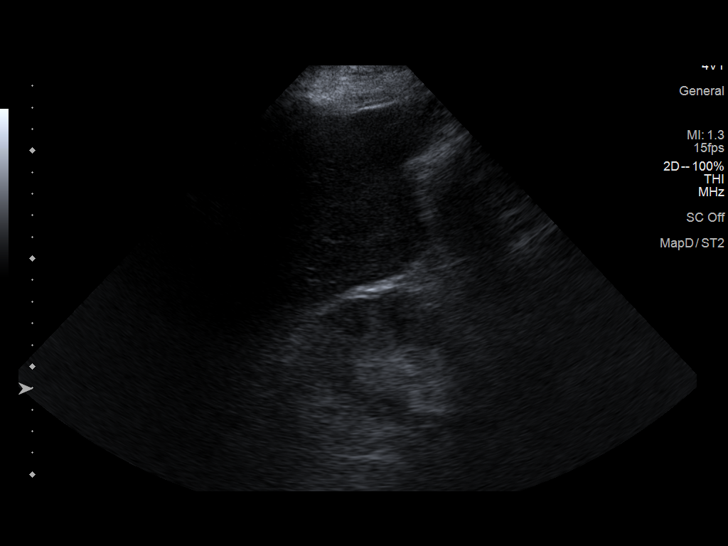
[im 11/33]
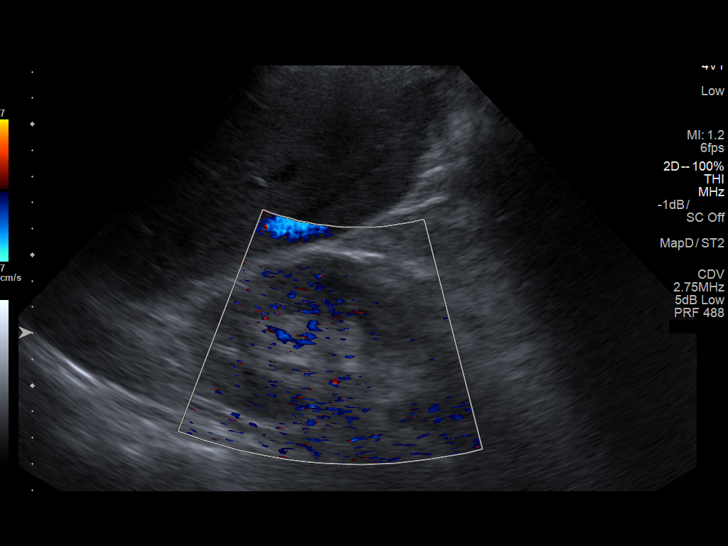
[im 13/33]
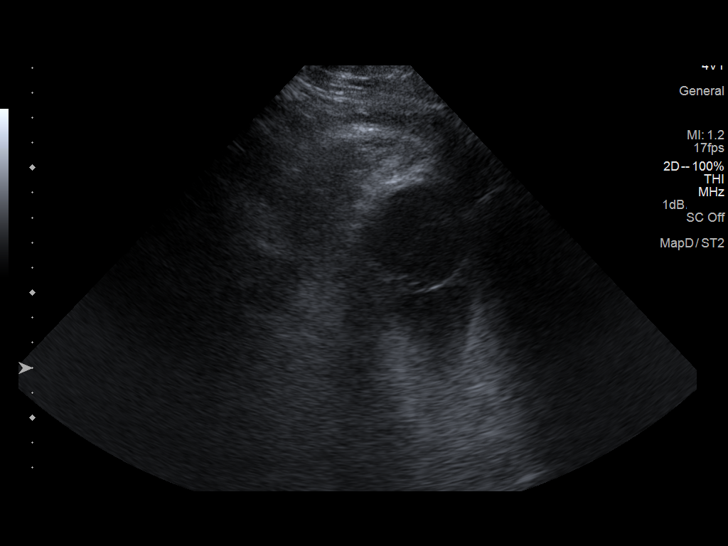
[im 15/33]
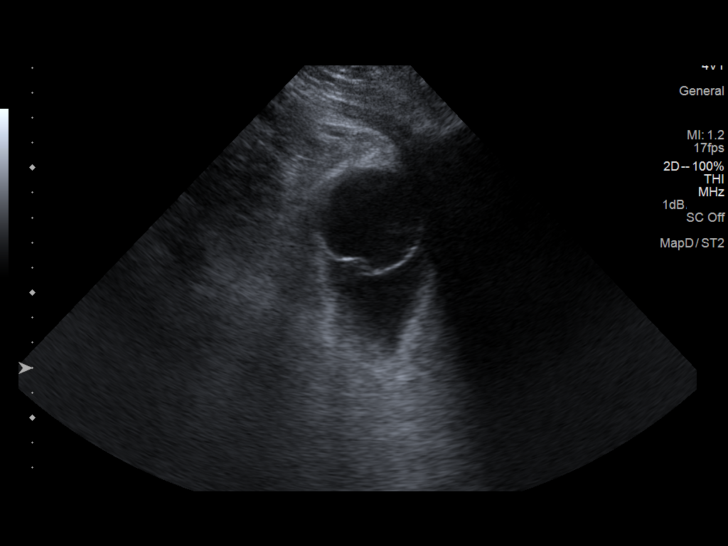
[im 18/33]
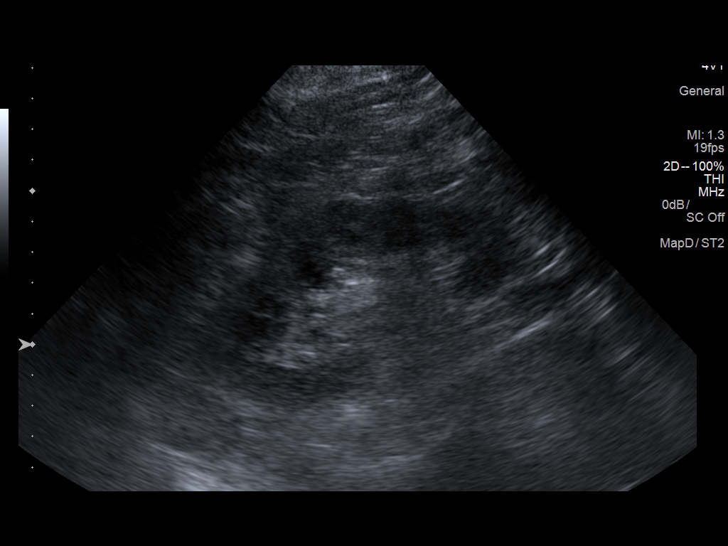
[im 21/33]
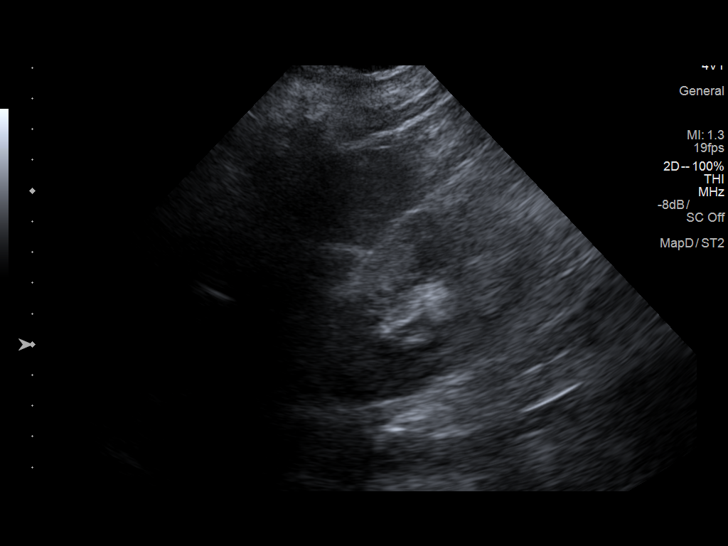
[im 22/33]
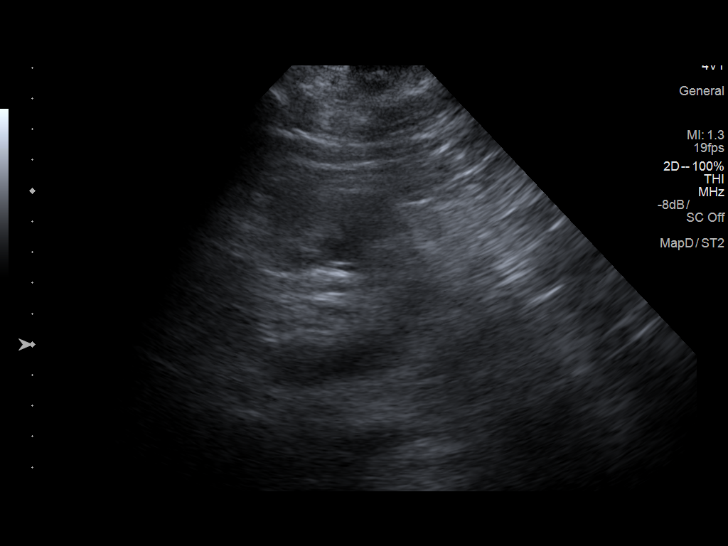
[im 25/33]
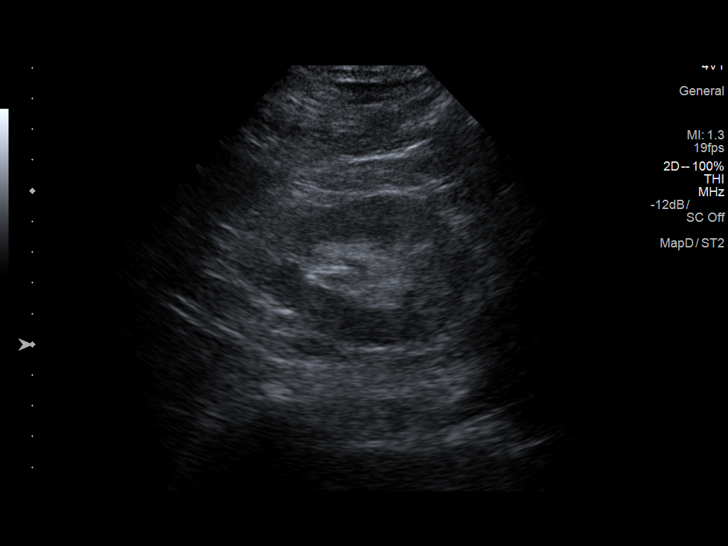
[im 27/33]
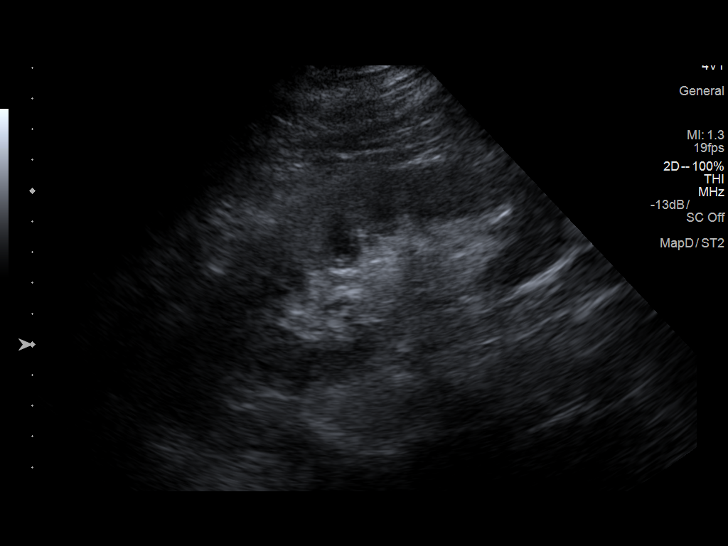
[im 30/33]
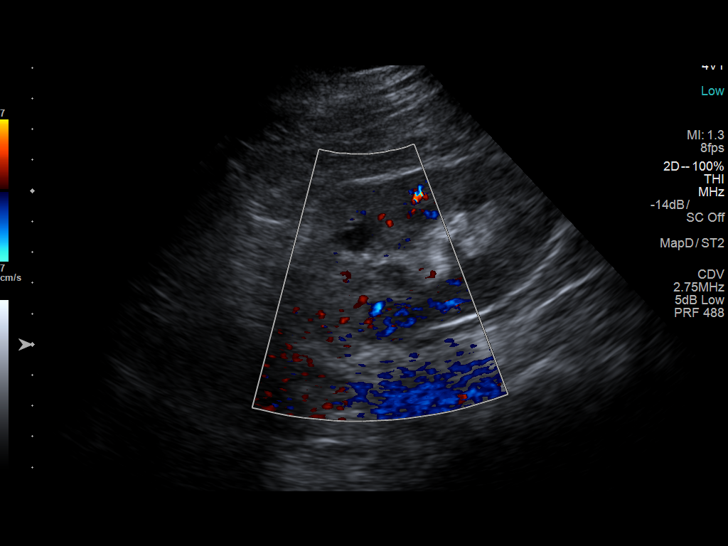
[im 33/33]
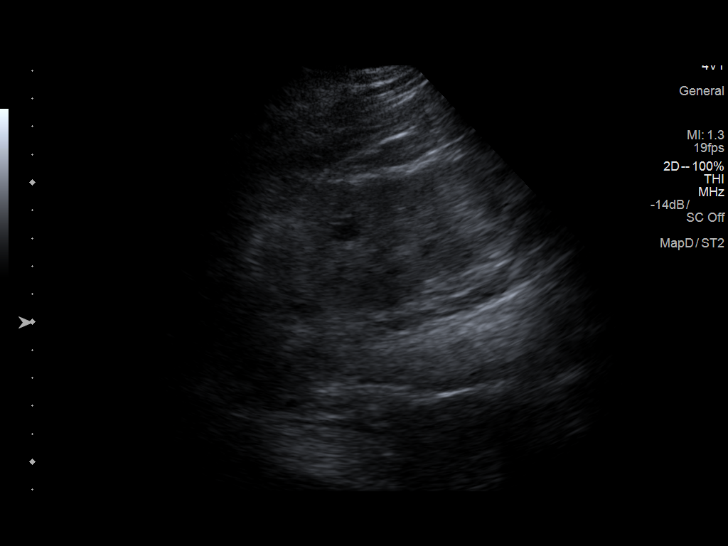

[14 of 25 positions shown; findings below may reference images not displayed]

FINDINGS: Right Kidney:

Length: 11.5. Echogenicity within normal limits. No mass or
hydronephrosis visualized.

Left Kidney:

Length: 11.7 cm. Echogenicity within normal limits. 1.6 cm cyst in
the mid kidney. No solid mass or hydronephrosis visualized.

Bladder:

Decompressed by suprapubic catheter and not well evaluated.
IMPRESSION: No hydronephrosis or obstructive uropathy.

Bladder decompressed by suprapubic catheter and not well evaluated.

## 2017-02-06 NOTE — Consult Note (Signed)
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  Is contracted.  Full assist with bed mobility.  Pressure redistribution bed in place.  Reason for Consult: He has multiple chronic wounds and was being followed by the outpatient wound care center prior to admission.  His sister assists with dressing changes at home  Pressure Injury POA: Yes Measurement: It is difficult to obtain accurate measurements since patient has unusual anatomy and landmarks related to a flap surgery long ago. Sacral/Buttocks/Bilateral ischial tuberosity: Approx 25X24cm; There is a deeper area to the right side of the wound which is 2X1X7cm.  Wound is 80% red, 10% exposed bone, 10% yellow.  Mod amt tan drainage, slight odor; surrounded by pink moist scar tissue to wound edges. Patchy areas of partial thickness skin loss to posterior perineum and posterior scrotum; red and moist; appearance is consistent with moisture associated skin damage. Right posterior flank; 4X4X.1cm, pink and moist, nongranulating Right inner heel with 3X3cm dark reddish purple deep tissue injury. Right posterior leg with healing full thickness wound; 1X1X.1cm, pink  Right dorsal foot with dry intact eschar:  2 cm x 4 cm no drainage  Open to air at this time.    Dressing procedure/placement/frequency: Pt is on a Sport low air loss bed to reduce pressure.  Cleanse buttocks and right back wounds with NS.  Fill wound bed with Aquacel Ag.  Fill depth with saline moist gauze dressings  topping dressings with ABD pads and securing with tape. Change daily and PRN soilage   Float heels to reduce pressure. Barrier cream to perineum to repel moisture.  [place] Nurse ostomy consultnote Stoma type/location: LLQ colostomy, seal is intact Output: Mod amt brown stool, soft Ostomy pouching: 2pc. 2 and 3/4 inch pouching system. Extra supplies left at the bedside for staff nurse use. Will not  follow at this time.  Please re-consult if needed.  Domenic Moras RN BSN [place] Pager [phone]

## 2017-02-06 NOTE — Progress Notes (Signed)
Assumed care of patient at this time. Patient is stable with no complaints at this time. Agree with previously documented assessment. Will continue to monitor patient.   

## 2017-02-06 NOTE — Progress Notes (Signed)
PROGRESS NOTE    EDKER PUNT  AYT:[id number] DOB: 1966-02-06 DOA: 02/05/2017 PCP: [name], MD  Brief Narrative:[name] [name]  is a 51 y.o. 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 He reported being constipated last week but started having dark loose stools x1 day. Slight downward trend of Hb, hemoccult positive, CT abd negative for acute findings   Assessment & Plan:     GI bleed -Possibly upper GI bleed/gastritis/esophagitis -Has history esophagitis on EGD- 10/09/2015) -CT with increased gastric contents concerning for gastroparesis -Continue IV PPI today, monitor hemoglobin every 12, -[facility] consultation, plan for endoscopy tomorrow   C5 quadriplegia, colostomy and suprapubic catheter -Cared for by his sister at home -Recent UTIs status post antibiotic course and suprapubic catheter exchanged about 10 days back   Iron deficiency anemia On iron supplements and Aranesp injections  -FU with Dr. Burr Medico.   Sacral decubitus ulcer, stage IV ([name] -wound care consult  OSA on CPAP -Continue CPAP bedtime and  Protein calorie malnutrition, severe Continue supplements.  DVT prophylaxis: SCDs Code Status: Full Code Family Communication: none at bedside Disposition Plan: Home pending workup    Antimicrobials:    Subjective: -Feels better this morning, denies any hematemesis or melena had brown stools earlier today Objective: Vitals:   02/05/17 1712 02/05/17 2036 02/05/17 2142 02/06/17 0350  BP: 103/63 100/60  (!) 107/56  Pulse: (!) 105 92  (!) 113  Resp:  16  16  Temp:  97.8 F (36.6 C)  98.1 F (36.7 C)  TempSrc:  Oral  Oral  SpO2: 97% 99%  100%  Height:   6' (1.829 m)     Intake/Output  Summary (Last 24 hours) at 02/06/17 1257 Last data filed at 02/06/17 0654  Gross per 24 hour  Intake             2444 ml  Output              725 ml  Net             1719 ml   There were no vitals filed for this visit.  Examination:  Chronically ill pleasant African-American male, quadriplegic no distress,  General exam: Appears calm and comfortable  Respiratory system: Clear to auscultation. Respiratory effort normal. Cardiovascular system: S1 & S2 heard, RRR. No JVD, murmurs, rubs, gallops Gastrointestinal system: Abdomen is nondistended, soft and nontender.Normal bowel sounds heard. suprapubic catheter noted  Central nervous system: Alert and oriented.  quadriplegic with contractures Extremities: Quadriplegic, contracted Psychiatry: Judgement and insight appear normal. Mood & affect appropriate.     Data Reviewed:   CBC:  Recent Labs Lab 02/03/17 1313 02/05/17 1210 02/06/17 0300 02/06/17 0959  WBC 9.1 13.5*  --   --   NEUTROABS 6.0 10.4*  --   --   HGB 11.6* 10.7* 9.8* 10.1*  HCT 35.9* 34.4* 31.2* 32.6*  MCV 80.0 82.3  --   --   PLT 346 393  --   --    Basic Metabolic Panel:  Recent Labs Lab 02/05/17 1210 02/06/17 0959  NA 140 140  K 3.2* 3.5  CL 107 110  CO2 21* 22  GLUCOSE 94 116*  BUN 37* 32*  CREATININE 0.70 0.68  CALCIUM 9.2 8.9   GFR:  CrCl cannot be calculated (Unknown ideal weight.). Liver Function Tests:  Recent Labs Lab 02/05/17 1210  AST 36  ALT 47  ALKPHOS 92  BILITOT 0.4  PROT 8.4*  ALBUMIN 3.3*   No results for input(s): LIPASE, AMYLASE in the last 168 hours. No results for input(s): AMMONIA in the last 168 hours. Coagulation Profile: No results for input(s): INR, PROTIME in the last 168 hours. Cardiac Enzymes: No results for input(s): CKTOTAL, CKMB, CKMBINDEX, TROPONINI in the last 168 hours. BNP (last 3 results) No results for input(s): PROBNP in the last 8760 hours. HbA1C: No results for input(s): HGBA1C in the last 72  hours. CBG: No results for input(s): GLUCAP in the last 168 hours. Lipid Profile: No results for input(s): CHOL, HDL, LDLCALC, TRIG, CHOLHDL, LDLDIRECT in the last 72 hours. Thyroid Function Tests: No results for input(s): TSH, T4TOTAL, FREET4, T3FREE, THYROIDAB in the last 72 hours. Anemia Panel:  Recent Labs  02/03/17 1313  FERRITIN 1,189*  TIBC 184*  IRON 25*   Urine analysis:    Component Value Date/Time   COLORURINE YELLOW 02/05/2017 1233   APPEARANCEUR TURBID (A) 02/05/2017 1233   LABSPEC 1.015 02/05/2017 1233   PHURINE 7.5 02/05/2017 1233   GLUCOSEU NEGATIVE 02/05/2017 1233   HGBUR TRACE (A) 02/05/2017 1233   BILIRUBINUR SMALL (A) 02/05/2017 1233   KETONESUR NEGATIVE 02/05/2017 1233   PROTEINUR 100 (A) 02/05/2017 1233   UROBILINOGEN 1.0 03/16/2015 0835   NITRITE NEGATIVE 02/05/2017 1233   LEUKOCYTESUR LARGE (A) 02/05/2017 1233   Sepsis Labs: @LABRCNTIP (procalcitonin:4,lacticidven:4)  ) Recent Results (from the past 240 hour(s))  C difficile quick scan w PCR reflex     Status: None   Collection Time: 02/05/17  1:00 PM  Result Value Ref Range Status   C Diff antigen NEGATIVE NEGATIVE Final   C Diff toxin NEGATIVE NEGATIVE Final   C Diff interpretation No C. difficile detected.  Final  MRSA PCR Screening     Status: None   Collection Time: 02/05/17 10:15 PM  Result Value Ref Range Status   MRSA by PCR NEGATIVE NEGATIVE Final    Comment:        The GeneXpert MRSA Assay (FDA approved for NASAL specimens only), is one component of a comprehensive MRSA colonization surveillance program. It is not intended to diagnose MRSA infection nor to guide or monitor treatment for MRSA infections.          Radiology Studies: Ct Abdomen Pelvis W Contrast  Result Date: 02/05/2017 CLINICAL DATA:  Vomiting and black tarry stool since Friday. Quadriplegic. EXAM: CT ABDOMEN AND PELVIS WITH CONTRAST TECHNIQUE: Multidetector CT imaging of the abdomen and pelvis was  performed using the standard protocol following bolus administration of intravenous contrast. CONTRAST:  <See Chart> ISOVUE-300 IOPAMIDOL (ISOVUE-300) INJECTION 61% COMPARISON:  08/27/2016 FINDINGS: Lower chest: Lung bases demonstrate mild bibasilar atelectasis/scarring. Subtle right base opacification which may be due to atelectasis although cannot exclude early infection. Tiny amount of right pleural fluid fluid within the distal esophagus likely due to reflux. Hepatobiliary: Within normal. Pancreas: Within normal. Spleen: Within normal. Adrenals/Urinary Tract: Adrenal glands are normal. Kidneys normal in size without hydronephrosis or nephrolithiasis. 1 cm hypodensity over the mid pole cortex of the left kidney unchanged likely a cyst. Ureters are normal. Suprapubic Foley catheter is present within a decompressed bladder. Stomach/Bowel: Stable gastric distension. Small bowel is within normal. Appendix is normal. Left lower quadrant ostomy site within normal and unchanged. Stool within the distal colon unchanged. Vascular/Lymphatic: Within normal.  Reproductive: Within normal. Other: No free fluid, free air or focal inflammatory change. Musculoskeletal: Severe chronic changes of the pelvis and hips without significant change. Evidence of patient's right-sided soft tissue ulceration posterior to the right hip without significant change. Cannot exclude soft tissue infection or associated adjacent osteomyelitis. IMPRESSION: No acute findings in the abdomen/pelvis. Bibasilar scarring/ atelectasis with tiny amount of right pleural fluid. Subtle right base opacification as could not completely exclude an early infectious process in the right base. Stable left renal cyst. Ostomy site over the left lower quadrant unchanged. Suprapubic Foley catheter within a decompressed bladder. Fluid within the distal esophagus likely due to reflux. Mild stable gastric distension. Severe chronic changes over the pelvic bones and hips  with stable soft tissue ulceration over the posterior aspect of the right hip. Cannot exclude soft tissue infection or bone infection. Electronically Signed   By: Marin Olp M.D.   On: 02/05/2017 14:44        Scheduled Meds: . docusate sodium  200 mg Oral BID  . feeding supplement (PRO-STAT SUGAR FREE 64)  30 mL Oral TID WC  . fesoterodine  8 mg Oral Daily  . magnesium oxide  400 mg Oral Daily  . metoCLOPramide  10 mg Oral Q6H  . multivitamin with minerals  1 tablet Oral q morning - 10a  . pantoprazole (PROTONIX) IV  40 mg Intravenous Q12H  . potassium chloride SA  20 mEq Oral BID  . sucralfate  1 g Oral QID  . vitamin C  500 mg Oral q morning - 10a   Continuous Infusions: . sodium chloride 10 mL/hr at 02/06/17 1151     LOS: 0 days    Time spent: 56min    Domenic Polite, MD [facility] Pager [phone]  If 7PM-7AM, please contact night-coverage [email or web address] Password [facility] 02/06/2017, 12:57 PM
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
## 2017-02-06 NOTE — Progress Notes (Signed)
PCP was notified to renew the Cardiac Monitoring order if appropriate.

## 2017-02-07 ENCOUNTER — Observation Stay (HOSPITAL_COMMUNITY): Payer: Medicare Other | Admitting: Anesthesiology

## 2017-02-07 ENCOUNTER — Encounter (HOSPITAL_COMMUNITY)
Admission: EM | Disposition: A | Discharge status: Home / Self Care | Payer: Self-pay | Source: Home / Self Care | Attending: Emergency Medicine

## 2017-02-07 ENCOUNTER — Encounter (HOSPITAL_COMMUNITY): Payer: Self-pay

## 2017-02-07 DIAGNOSIS — K920 Hematemesis: Secondary | ICD-10-CM | POA: Diagnosis not present

## 2017-02-07 DIAGNOSIS — K922 Gastrointestinal hemorrhage, unspecified: Secondary | ICD-10-CM | POA: Diagnosis not present

## 2017-02-07 DIAGNOSIS — K921 Melena: Secondary | ICD-10-CM | POA: Diagnosis not present

## 2017-02-07 DIAGNOSIS — D509 Iron deficiency anemia, unspecified: Secondary | ICD-10-CM | POA: Diagnosis not present

## 2017-02-07 DIAGNOSIS — E876 Hypokalemia: Secondary | ICD-10-CM | POA: Diagnosis not present

## 2017-02-07 DIAGNOSIS — K3184 Gastroparesis: Secondary | ICD-10-CM | POA: Diagnosis not present

## 2017-02-07 DIAGNOSIS — Z23 Encounter for immunization: Secondary | ICD-10-CM | POA: Diagnosis not present

## 2017-02-07 HISTORY — PX: ESOPHAGOGASTRODUODENOSCOPY (EGD) WITH PROPOFOL: SHX5813

## 2017-02-07 LAB — BASIC METABOLIC PANEL
ANION GAP: 7 (ref 5–15)
BUN: 26 mg/dL — ABNORMAL HIGH (ref 6–20)
CHLORIDE: 111 mmol/L (ref 101–111)
CO2: 22 mmol/L (ref 22–32)
Calcium: 8.7 mg/dL — ABNORMAL LOW (ref 8.9–10.3)
Creatinine, Ser: 0.53 mg/dL — ABNORMAL LOW (ref 0.61–1.24)
GFR calc non Af Amer: >60 mL/min (ref >60)
Glucose, Bld: 92 mg/dL (ref 65–99)
POTASSIUM: 3.9 mmol/L (ref 3.5–5.1)
SODIUM: 140 mmol/L (ref 135–145)

## 2017-02-07 LAB — CBC
HEMATOCRIT: 29 % — AB (ref 39.0–52.0)
HEMOGLOBIN: 9 g/dL — AB (ref 13.0–17.0)
MCH: 25.7 pg — ABNORMAL LOW (ref 26.0–34.0)
MCHC: 31 g/dL (ref 30.0–36.0)
MCV: 82.9 fL (ref 78.0–100.0)
Platelets: 326 10*3/uL (ref 150–400)
RBC: 3.5 MIL/uL — AB (ref 4.22–5.81)
RDW: 15.1 % (ref 11.5–15.5)
WBC: 8 10*3/uL (ref 4.0–10.5)

## 2017-02-07 LAB — URINE CULTURE

## 2017-02-07 SURGERY — ESOPHAGOGASTRODUODENOSCOPY (EGD) WITH PROPOFOL
Anesthesia: Monitor Anesthesia Care

## 2017-02-07 MED ORDER — PROPOFOL 10 MG/ML IV BOLUS
INTRAVENOUS | Status: DC | PRN
  Administered 2017-02-07: 30 mg via INTRAVENOUS
  Administered 2017-02-07: 50 mg via INTRAVENOUS

## 2017-02-07 MED ORDER — PROPOFOL 10 MG/ML IV BOLUS
INTRAVENOUS | Status: AC
  Filled 2017-02-07: qty 40

## 2017-02-07 MED ORDER — DARBEPOETIN ALFA 100 MCG/0.5ML IJ SOSY
100.0000 ug | PREFILLED_SYRINGE | Freq: Once | INTRAMUSCULAR | Status: AC
  Administered 2017-02-07: 100 ug via SUBCUTANEOUS
  Filled 2017-02-07: qty 0.5

## 2017-02-07 MED ORDER — METOCLOPRAMIDE HCL 10 MG PO TABS
10.0000 mg | ORAL_TABLET | Freq: Four times a day (QID) | ORAL | Status: DC | PRN
  Administered 2017-02-07 (×2): 10 mg via ORAL
  Filled 2017-02-07 (×3): qty 1

## 2017-02-07 MED ORDER — LIDOCAINE HCL (CARDIAC) 20 MG/ML IV SOLN
INTRAVENOUS | Status: DC | PRN
  Administered 2017-02-07: 100 mg via INTRATRACHEAL

## 2017-02-07 MED ORDER — PANTOPRAZOLE SODIUM 40 MG PO TBEC
40.0000 mg | DELAYED_RELEASE_TABLET | Freq: Two times a day (BID) | ORAL | Status: DC
  Administered 2017-02-07 – 2017-02-08 (×2): 40 mg via ORAL
  Filled 2017-02-07 (×2): qty 1

## 2017-02-07 MED ORDER — LACTATED RINGERS IV SOLN
INTRAVENOUS | Status: DC | PRN
  Administered 2017-02-07: 10:00:00 via INTRAVENOUS

## 2017-02-07 MED ORDER — PROPOFOL 10 MG/ML IV BOLUS
INTRAVENOUS | Status: DC | PRN
  Administered 2017-02-07: 125 ug/kg/min via INTRAVENOUS

## 2017-02-07 SURGICAL SUPPLY — 15 items

## 2017-02-07 NOTE — Op Note (Signed)
[facility] Patient Name: [name] [name] Procedure Date: 02/07/2017 MRN: [id number] Attending MD: Clarene Essex , MD Date of Birth: [birth year] CSN: [id number] Age: 51 Admit Type: Inpatient Procedure:                Upper GI endoscopy Indications:              Hematemesis, Melena Providers:                Clarene Essex, MD, Elmer Ramp. Tilden Dome, RN, Tinnie Gens,                            Technician, Stephanie [place] CRNA Referring MD:              Medicines:                Propofol total dose 220 mg IV 100 mg lidocaine Complications:            No immediate complications. Estimated Blood Loss:     Estimated blood loss: none. Procedure:                Pre-Anesthesia Assessment:                           - Prior to the procedure, a History and Physical                            was performed, and patient medications and                            allergies were reviewed. The patient's tolerance of                            previous anesthesia was also reviewed. The risks                            and benefits of the procedure and the sedation                            options and risks were discussed with the patient.                            All questions were answered, and informed consent                            was obtained. Prior Anticoagulants: The patient has                            taken no previous anticoagulant or antiplatelet                            agents. ASA Grade Assessment: III - A patient with                            severe systemic disease. After reviewing the risks  and benefits, the patient was deemed in                            satisfactory condition to undergo the procedure.                           After obtaining informed consent, the endoscope was                            passed under direct vision. Throughout the                            procedure, the patient's blood pressure, pulse, and            oxygen saturations were monitored continuously. The                            Endoscope was introduced through the mouth, and                            advanced to the second part of duodenum. The upper                            GI endoscopy was accomplished without difficulty.                            The patient tolerated the procedure well. Scope In: Scope Out: Findings:      A small hiatal hernia was present. His esophagus looks better than it       has in the past      A small amount of food (residue) was found in the cardia and in the       gastric fundus. The stomach was a little dilated      The duodenal bulb, first portion of the duodenum and second portion of       the duodenum were normal.      The exam was otherwise without abnormality. Impression:               - Small hiatal hernia.                           - A small amount of food (residue) in the stomach.                           - Normal duodenal bulb, first portion of the                            duodenum and second portion of the duodenum.                           - The examination was otherwise normal.                           - No specimens collected. Moderate Sedation:      N/A- Per Anesthesia Care Recommendation:           -  Patient has a contact number available for                            emergencies. The signs and symptoms of potential                            delayed complications were discussed with the                            patient. Return to normal activities tomorrow.                            Written discharge instructions were provided to the                            patient.                           - Soft diet today. May resume previous home diet                            tomorrow and continue antireflux measures                           - Continue present medications. Okay to switch to                            oral medicines continue pump inhibitors at home                             long term                           - Return to GI clinic PRN.                           - Telephone GI clinic if symptomatic PRN. Procedure Code(s):        --- Professional ---                           [phone], Esophagogastroduodenoscopy, flexible,                            transoral; diagnostic, including collection of                            specimen(s) by brushing or washing, when performed                            (separate procedure) Diagnosis Code(s):        --- Professional ---                           K44.9, Diaphragmatic hernia without obstruction or  gangrene                           K92.0, Hematemesis                           K92.1, Melena (includes Hematochezia) CPT copyright 2016 American Medical Association. All rights reserved. The codes documented in this report are preliminary and upon coder review may  be revised to meet current compliance requirements. Clarene Essex, MD 02/07/2017 10:55:21 AM This report has been signed electronically. Number of Addenda: 0

## 2017-02-07 NOTE — Transfer of Care (Signed)
Immediate Anesthesia Transfer of Care Note  Patient: [name] [name]  Procedure(s) Performed: Procedure(s): ESOPHAGOGASTRODUODENOSCOPY (EGD) WITH PROPOFOL (N/A)  Patient Location: PACU and Endoscopy Unit  Anesthesia Type:mac  Level of Consciousness: awake and alert   Airway & Oxygen Therapy: Patient Spontanous Breathing  Post-op Assessment: Report given to RN and Post -op Vital signs reviewed and stable  Post vital signs: Reviewed and stable  Last Vitals:  Vitals:   02/07/17 0949 02/07/17 1056  BP: (!) 109/58 117/65  Pulse:  96  Resp:  (!) 22  Temp:  37.4 C  SpO2:  97%    Last Pain:  Vitals:   02/07/17 1056  TempSrc: Oral  PainSc:          Complications: No apparent anesthesia complications

## 2017-02-07 NOTE — Anesthesia Postprocedure Evaluation (Signed)
Anesthesia Post Note  Patient: [name] [name]  Procedure(s) Performed: Procedure(s) (LRB): ESOPHAGOGASTRODUODENOSCOPY (EGD) WITH PROPOFOL (N/A)     Patient location during evaluation: PACU Anesthesia Type: MAC Level of consciousness: awake and alert Pain management: pain level controlled Vital Signs Assessment: post-procedure vital signs reviewed and stable Respiratory status: spontaneous breathing, nonlabored ventilation, respiratory function stable and patient connected to nasal cannula oxygen Cardiovascular status: stable and blood pressure returned to baseline Postop Assessment: no apparent nausea or vomiting Anesthetic complications: no    Last Vitals:  Vitals:   02/07/17 0949 02/07/17 1056  BP: (!) 109/58 117/65  Pulse:  96  Resp:  (!) 22  Temp:  37.4 C  SpO2:  97%    Last Pain:  Vitals:   02/07/17 1056  TempSrc: Oral  PainSc:                  Jamillia Closson S

## 2017-02-07 NOTE — Progress Notes (Signed)
PROGRESS NOTE    [name] [name]  XKG:[id number] DOB: Jun 26, 1965 DOA: 02/05/2017 PCP: [name], MD  Brief Narrative:[name] [name]  is a 51 y.o. 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 He reported being constipated last week but started having dark loose stools x1 day. Slight downward trend of Hb, hemoccult positive, CT abd negative for acute findings   Assessment & Plan:     GI bleed -Possibly upper GI bleed/gastritis/esophagitis -Has history esophagitis on EGD- 10/09/2015) -CT with increased gastric contents concerning for gastroparesis -Continue IV PPI today, slight drop in Hb to 9 [facility] gastroenterology consult appreciated, plan for endoscopy today -will give IV iron per pharmacy-allergic to feraheme, gets IV iron PRN at the cancer center   C5 quadriplegia, colostomy and suprapubic catheter -Cared for by his sister at home -Recent UTIs status post antibiotic course and suprapubic catheter exchanged about 10 days back   Iron deficiency anemia On iron supplements and Aranesp injections  -FU with Dr. Burr Medico.   Sacral decubitus ulcer, stage IV (Adrian) -wound care consult appreciated -followed at the [facility]  [facility] on CPAP -Continue CPAP bedtime and  Protein calorie malnutrition, severe Continue supplements.  DVT prophylaxis: SCDs Code Status: Full Code Family Communication: none at bedside Disposition Plan: Home pending workup    Antimicrobials:    Subjective: -no complaints, some nausea, awaiting endoscopy Objective: Vitals:   02/07/17 1056 02/07/17 1100 02/07/17 1110 02/07/17 1209  BP: 117/65 112/66 128/79 113/69  Pulse: 96 (!) 109 94 86  Resp: (!) 22 19 13    Temp: 99.3 F (37.4 C)   98.9 F (37.2 C)    TempSrc: Oral     SpO2: 97% 97% 98% 98%  Weight:      Height:        Intake/Output Summary (Last 24 hours) at 02/07/17 1446 Last data filed at 02/07/17 1342  Gross per 24 hour  Intake          1968.58 ml  Output              975 ml  Net           993.58 ml   Filed Weights   02/07/17 0946  Weight: 91.6 kg (202 lb)    Examination:  Gen: Chronically ill pleasant African-American male, quadriplegic, no distress HEENT: PERRLA, Neck supple, no JVD Lungs: Good air movement bilaterally, CTAB CVS: RRR,No Gallops,Rubs or new Murmurs Abd: soft, Non tender, non distended, BS present, suprapubic catheter noted Extremities: No Cyanosis, Clubbing or edema Skin: no new rashes Neuro quadriplegic with contractures:    Data Reviewed:   CBC:  Recent Labs Lab 02/03/17 1313 02/05/17 1210 02/06/17 0300 02/06/17 0959 02/07/17 0404  WBC 9.1 13.5*  --   --  8.0  NEUTROABS 6.0 10.4*  --   --   --   HGB 11.6* 10.7* 9.8* 10.1* 9.0*  HCT 35.9* 34.4* 31.2* 32.6* 29.0*  MCV 80.0 82.3  --   --  82.9  PLT 346 393  --   --  702   Basic Metabolic Panel:  Recent Labs Lab 02/05/17 1210 02/06/17 0959 02/07/17 0404  NA 140 140 140  K 3.2* 3.5 3.9  CL 107 110 111  CO2 21* 22 22  GLUCOSE 94 116*  92  BUN 37* 32* 26*  CREATININE 0.70 0.68 0.53*  CALCIUM 9.2 8.9 8.7*   GFR: Estimated Creatinine Clearance: 121.3 mL/min (A) (by C-G formula based on SCr of 0.53 mg/dL (L)). Liver Function Tests:  Recent Labs Lab 02/05/17 1210  AST 36  ALT 47  ALKPHOS 92  BILITOT 0.4  PROT 8.4*  ALBUMIN 3.3*   No results for input(s): LIPASE, AMYLASE in the last 168 hours. No results for input(s): AMMONIA in the last 168 hours. Coagulation Profile: No results for input(s): INR, PROTIME in the last 168 hours. Cardiac Enzymes: No results for input(s): CKTOTAL, CKMB, CKMBINDEX, TROPONINI in the last 168 hours. BNP (last 3 results) No results for input(s): PROBNP in the last 8760 hours. HbA1C: No  results for input(s): HGBA1C in the last 72 hours. CBG: No results for input(s): GLUCAP in the last 168 hours. Lipid Profile: No results for input(s): CHOL, HDL, LDLCALC, TRIG, CHOLHDL, LDLDIRECT in the last 72 hours. Thyroid Function Tests: No results for input(s): TSH, T4TOTAL, FREET4, T3FREE, THYROIDAB in the last 72 hours. Anemia Panel: No results for input(s): VITAMINB12, FOLATE, FERRITIN, TIBC, IRON, RETICCTPCT in the last 72 hours. Urine analysis:    Component Value Date/Time   COLORURINE YELLOW 02/05/2017 1233   APPEARANCEUR TURBID (A) 02/05/2017 1233   LABSPEC 1.015 02/05/2017 1233   PHURINE 7.5 02/05/2017 1233   GLUCOSEU NEGATIVE 02/05/2017 1233   HGBUR TRACE (A) 02/05/2017 1233   BILIRUBINUR SMALL (A) 02/05/2017 1233   KETONESUR NEGATIVE 02/05/2017 1233   PROTEINUR 100 (A) 02/05/2017 1233   UROBILINOGEN 1.0 03/16/2015 0835   NITRITE NEGATIVE 02/05/2017 1233   LEUKOCYTESUR LARGE (A) 02/05/2017 1233   Sepsis Labs: @LABRCNTIP (procalcitonin:4,lacticidven:4)  ) Recent Results (from the past 240 hour(s))  C difficile quick scan w PCR reflex     Status: None   Collection Time: 02/05/17  1:00 PM  Result Value Ref Range Status   C Diff antigen NEGATIVE NEGATIVE Final   C Diff toxin NEGATIVE NEGATIVE Final   C Diff interpretation No C. difficile detected.  Final  Urine culture     Status: Abnormal   Collection Time: 02/05/17  2:43 PM  Result Value Ref Range Status   Specimen Description URINE, RANDOM  Final   Special Requests NONE  Final   Culture MULTIPLE SPECIES PRESENT, SUGGEST RECOLLECTION (A)  Final   Report Status 02/07/2017 FINAL  Final  MRSA PCR Screening     Status: None   Collection Time: 02/05/17 10:15 PM  Result Value Ref Range Status   MRSA by PCR NEGATIVE NEGATIVE Final    Comment:        The GeneXpert MRSA Assay (FDA approved for NASAL specimens only), is one component of a comprehensive MRSA colonization surveillance program. It is not intended to  diagnose MRSA infection nor to guide or monitor treatment for MRSA infections.          Radiology Studies: No results found.      Scheduled Meds: . docusate sodium  200 mg Oral BID  . feeding supplement (PRO-STAT SUGAR FREE 64)  30 mL Oral TID WC  . fesoterodine  8 mg Oral Daily  . magnesium oxide  400 mg Oral Daily  . multivitamin with minerals  1 tablet Oral q morning - 10a  . pantoprazole  40 mg Oral BID  . potassium chloride SA  20 mEq Oral BID  . sucralfate  1 g Oral QID  . vitamin C  500 mg Oral q  morning - 10a   Continuous Infusions: . sodium chloride 10 mL/hr at 02/07/17 0902     LOS: 0 days    Time spent: 49min    Domenic Polite, MD [facility] Pager [phone]  If 7PM-7AM, please contact night-coverage [email or web address] Password [facility] 02/07/2017, 2:46 PM
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
## 2017-02-07 NOTE — Care Management Obs Status (Signed)
[facility] NOTIFICATION   Patient Details  Name: [name] [name] MRN: [id number] Date of Birth: 23-Feb-1966   Medicare Observation Status Notification Given:  Yes    Lonald Troiani, Benjaman Lobe, RN 02/07/2017, 6:24 PM

## 2017-02-07 NOTE — Care Management Note (Signed)
Case Management Note  MOON and ABN given to pt. Copy in chart. Copy to pt.

## 2017-02-07 NOTE — Progress Notes (Signed)
[name] [name] 10:34 AM  Subjective: Patient feeling better without any new complaints no signs of active bleeding and tolerating clear liquids in good spirits  Objective: Vital signs stable afebrile no acute distress exam please see preassessment evaluation hemoglobin slight drop slightly decreased BUN  Assessment: Recurrent hematemesis probably from severe reflux in a patient with multiple medical problems  Plan: Will proceed with endoscopy today with anesthesia assistance and hopefully be able to advance diet afterwards  Ku [facility] E  Pager [phone] After 5PM or if no answer call [phone]

## 2017-02-07 NOTE — Progress Notes (Signed)
The patient is receiving Protonix by the intravenous route.  Based on criteria approved by the Pharmacy and [place], the medication is being converted to the equivalent oral dose form.  These criteria include: -No active GI bleeding -Able to tolerate diet of full liquids (or better) or tube feeding -Able to tolerate other medications by the oral or enteral route  If you have any questions about this conversion, please contact the Pharmacy Department (phone [phone]).  Thank you. Eudelia Bunch, Pharm.D. [phone] 02/07/2017 2:01 PM

## 2017-02-08 ENCOUNTER — Encounter (HOSPITAL_COMMUNITY): Payer: Self-pay | Admitting: Gastroenterology

## 2017-02-08 DIAGNOSIS — R1084 Generalized abdominal pain: Secondary | ICD-10-CM | POA: Diagnosis not present

## 2017-02-08 DIAGNOSIS — K922 Gastrointestinal hemorrhage, unspecified: Secondary | ICD-10-CM

## 2017-02-08 DIAGNOSIS — K920 Hematemesis: Secondary | ICD-10-CM | POA: Diagnosis not present

## 2017-02-08 DIAGNOSIS — K921 Melena: Secondary | ICD-10-CM | POA: Diagnosis not present

## 2017-02-08 DIAGNOSIS — K3184 Gastroparesis: Secondary | ICD-10-CM | POA: Diagnosis not present

## 2017-02-08 DIAGNOSIS — D500 Iron deficiency anemia secondary to blood loss (chronic): Secondary | ICD-10-CM | POA: Diagnosis not present

## 2017-02-08 LAB — BASIC METABOLIC PANEL
Anion gap: 5 (ref 5–15)
BUN: 14 mg/dL (ref 6–20)
CALCIUM: 8.8 mg/dL — AB (ref 8.9–10.3)
CHLORIDE: 111 mmol/L (ref 101–111)
CO2: 23 mmol/L (ref 22–32)
CREATININE: 0.49 mg/dL — AB (ref 0.61–1.24)
GFR calc Af Amer: >60 mL/min (ref >60)
GFR calc non Af Amer: >60 mL/min (ref >60)
GLUCOSE: 105 mg/dL — AB (ref 65–99)
Potassium: 4 mmol/L (ref 3.5–5.1)
Sodium: 139 mmol/L (ref 135–145)

## 2017-02-08 LAB — CBC
HEMATOCRIT: 28.7 % — AB (ref 39.0–52.0)
HEMOGLOBIN: 8.9 g/dL — AB (ref 13.0–17.0)
MCH: 25.5 pg — ABNORMAL LOW (ref 26.0–34.0)
MCHC: 31 g/dL (ref 30.0–36.0)
MCV: 82.2 fL (ref 78.0–100.0)
Platelets: 341 10*3/uL (ref 150–400)
RBC: 3.49 MIL/uL — ABNORMAL LOW (ref 4.22–5.81)
RDW: 14.9 % (ref 11.5–15.5)
WBC: 7.6 10*3/uL (ref 4.0–10.5)

## 2017-02-08 MED ORDER — HEPARIN SOD (PORK) LOCK FLUSH 100 UNIT/ML IV SOLN
500.0000 [IU] | INTRAVENOUS | Status: AC | PRN
  Administered 2017-02-08: 500 [IU]

## 2017-02-08 NOTE — Discharge Summary (Signed)
Physician Discharge Summary  [name] [name]:[id number] DOB: 10-21-1965 DOA: 02/05/2017  PCP: [name], MD  Admit date: 02/05/2017 Discharge date: 02/08/2017  Recommendations for Outpatient Follow-up:  1. Follow up with PCP in 1-2 weeks to make sure symptoms are stable   Discharge Diagnoses:  Principal Problem:   GI bleed Active Problems:   Obesity   Quadriplegia ([place])   Seizure disorder ([place])   Sacral decubitus ulcer, stage IV ([name]   S/P colostomy ([place])   Suprapubic catheter ([place])   OSA on CPAP   Severe protein-calorie malnutrition ([name]   Iron deficiency anemia   Gastroparesis   Coffee ground emesis   Acute lower GI bleeding   Discharge Condition: stable   Diet recommendation: as tolerated   History of present illness:   Per brief narrative 02/07/2017  "51 y.o.male,with C5 quadriplegia, colostomy and suprapubic catheter chronic iron deficiency anemia, history of UTI, 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 He reported being constipated last week but started having dark loose stools x1 day. Slight downward trend of Hb, hemoccult positive, CT abd negative for acute findings."  Hospital Course:   Acute upper GI bleed / Iron deficiency anemia  - CT with increased gastric contents - EGD 9/25 showed small hiatal hernia otherwise unremarkable - Continue PPI therapy  - Gets IV iron PRN in cancer center  C5 quadriplegia, colostomy and suprapubic catheter - S/p Abx and suprapubic cathter exchanged 11 days ago  Sacral decubitus ulcer, stage IV ([place]) - Follow with wound care center   OSA on CPAP - CPAP at bedtime   Protein calorie malnutrition, severe - In the context of acute illness   DVT prophylaxis: SCD's Code Status: Full Code Family Communication: no family at the bedside this am   Signed:  Leisa Lenz, MD  [facility] 02/08/2017, 11:15 AM  Pager #: [phone]  Time spent in minutes: more than 30 minutes  Procedures:  Upper GI endoscopy 9/25 - small hiatal hernia  Consultations:  GI  Discharge Exam: Vitals:   02/07/17 2318 02/08/17 0453  BP:  (!) 152/96  Pulse: 91 (!) 102  Resp: 18 18  Temp:  99.8 F (37.7 C)  SpO2: 98% 98%   Vitals:   02/07/17 1209 02/07/17 2046 02/07/17 2318 02/08/17 0453  BP: 113/69 105/77  (!) 152/96  Pulse: 86 96 91 (!) 102  Resp:  16 18 18   Temp: 98.9 F (37.2 C) 100.2 F (37.9 C)  99.8 F (37.7 C)  TempSrc:  Oral  Oral  SpO2: 98% 99% 98% 98%  Weight:      Height:        General: Pt is alert, follows commands appropriately, not in acute distress Cardiovascular: Regular rate and rhythm, S1/S2 +, no murmurs Respiratory: Clear to auscultation bilaterally, no wheezing, no crackles, no rhonchi Abdominal: Soft, non tender, non distended, bowel sounds +, no guarding Extremities: no edema, no cyanosis, pulses palpable bilaterally DP and PT Neuro: Grossly nonfocal  Discharge Instructions  Discharge Instructions    Call MD for:  difficulty breathing, headache or visual disturbances    Complete by:  As directed    Call MD for:  redness, tenderness, or signs of infection (pain, swelling, redness, odor or green/yellow discharge around incision site)    Complete by:  As directed    Call MD for:  severe  uncontrolled pain    Complete by:  As directed    Diet - low sodium heart healthy    Complete by:  As directed    Increase activity slowly    Complete by:  As directed      Allergies as of 02/08/2017      Reactions   Feraheme [ferumoxytol] Other (See Comments)   SYNCOPE   Ditropan [oxybutynin] Other (See Comments)   Hallucinations    Vancomycin Other (See Comments)   ARF 05-2016      Medication List    STOP taking these medications   ciprofloxacin 500 MG tablet Commonly known as:  CIPRO   VESICARE 10 MG  tablet Generic drug:  solifenacin     TAKE these medications   baclofen 20 MG tablet Commonly known as:  LIORESAL Take 20 mg by mouth 4 (four) times daily.   docusate sodium 100 MG capsule Commonly known as:  COLACE Take 2 capsules (200 mg total) by mouth 2 (two) times daily.   feeding supplement (PRO-STAT SUGAR FREE 64) Liqd Take 30 mLs by mouth 3 (three) times daily with meals.   ferrous sulfate 325 (65 FE) MG EC tablet Take 1 tablet (325 mg total) by mouth 3 (three) times daily with meals.   furosemide 20 MG tablet Commonly known as:  LASIX Take 1 tablet (20 mg total) by mouth 2 (two) times daily as needed for fluid or edema. Take with Klor-Con   magnesium oxide 400 (241.3 Mg) MG tablet Commonly known as:  MAG-OX Take 1 tablet (400 mg total) by mouth daily.   metoCLOPramide 10 MG tablet Commonly known as:  REGLAN Take 1 tablet (10 mg total) by mouth every 6 (six) hours.   multivitamin with minerals Tabs tablet Take 1 tablet by mouth every morning.   pantoprazole 40 MG tablet Commonly known as:  PROTONIX Take 2 tablets (80 mg total) by mouth 2 (two) times daily before a meal.   potassium chloride SA 20 MEQ tablet Commonly known as:  K-DUR,KLOR-CON Take 1 tablet (20 mEq total) by mouth 2 (two) times daily.   sucralfate 1 g tablet Commonly known as:  CARAFATE Take 1 tablet (1 g total) by mouth 4 (four) times daily.   TOVIAZ 8 MG Tb24 tablet Generic drug:  fesoterodine Take 8 mg by mouth daily.   vitamin C 500 MG tablet Commonly known as:  ASCORBIC ACID Take 500 mg by mouth every morning.   wound dressings gel MediHoney - Apply to right distal plantar foot and right medial heel topically every day shift for wound care   Zinc 50 MG Tabs Take 50 mg by mouth 2 (two) times daily.            Discharge Care Instructions        Start     Ordered   02/08/17 0000  Increase activity slowly     02/08/17 1114   02/08/17 0000  Diet - low sodium heart healthy      02/08/17 1114   02/08/17 0000  Call MD for:  severe uncontrolled pain     02/08/17 1114   02/08/17 0000  Call MD for:  redness, tenderness, or signs of infection (pain, swelling, redness, odor or green/yellow discharge around incision site)     02/08/17 1114   02/08/17 0000  Call MD for:  difficulty breathing, headache or visual disturbances     02/08/17 1114     Follow-up Information    [name],  MD. Schedule an appointment as soon as possible for a visit in 1 week(s).   Specialty:  Internal Medicine Contact information: [address] [place] [address] [phone]            The results of significant diagnostics from this hospitalization (including imaging, microbiology, ancillary and laboratory) are listed below for reference.    Significant Diagnostic Studies: Ct Abdomen Pelvis W Contrast  Result Date: 02/05/2017 CLINICAL DATA:  Vomiting and black tarry stool since Friday. Quadriplegic. EXAM: CT ABDOMEN AND PELVIS WITH CONTRAST TECHNIQUE: Multidetector CT imaging of the abdomen and pelvis was performed using the standard protocol following bolus administration of intravenous contrast. CONTRAST:  <See Chart> ISOVUE-300 IOPAMIDOL (ISOVUE-300) INJECTION 61% COMPARISON:  08/27/2016 FINDINGS: Lower chest: Lung bases demonstrate mild bibasilar atelectasis/scarring. Subtle right base opacification which may be due to atelectasis although cannot exclude early infection. Tiny amount of right pleural fluid fluid within the distal esophagus likely due to reflux. Hepatobiliary: Within normal. Pancreas: Within normal. Spleen: Within normal. Adrenals/Urinary Tract: Adrenal glands are normal. Kidneys normal in size without hydronephrosis or nephrolithiasis. 1 cm hypodensity over the mid pole cortex of the left kidney unchanged likely a cyst. Ureters are normal. Suprapubic Foley catheter is present within a decompressed bladder. Stomach/Bowel: Stable gastric distension.  Small bowel is within normal. Appendix is normal. Left lower quadrant ostomy site within normal and unchanged. Stool within the distal colon unchanged. Vascular/Lymphatic: Within normal. Reproductive: Within normal. Other: No free fluid, free air or focal inflammatory change. Musculoskeletal: Severe chronic changes of the pelvis and hips without significant change. Evidence of patient's right-sided soft tissue ulceration posterior to the right hip without significant change. Cannot exclude soft tissue infection or associated adjacent osteomyelitis. IMPRESSION: No acute findings in the abdomen/pelvis. Bibasilar scarring/ atelectasis with tiny amount of right pleural fluid. Subtle right base opacification as could not completely exclude an early infectious process in the right base. Stable left renal cyst. Ostomy site over the left lower quadrant unchanged. Suprapubic Foley catheter within a decompressed bladder. Fluid within the distal esophagus likely due to reflux. Mild stable gastric distension. Severe chronic changes over the pelvic bones and hips with stable soft tissue ulceration over the posterior aspect of the right hip. Cannot exclude soft tissue infection or bone infection. Electronically Signed   By: Marin Olp M.D.   On: 02/05/2017 14:44    Microbiology: Recent Results (from the past 240 hour(s))  C difficile quick scan w PCR reflex     Status: None   Collection Time: 02/05/17  1:00 PM  Result Value Ref Range Status   C Diff antigen NEGATIVE NEGATIVE Final   C Diff toxin NEGATIVE NEGATIVE Final   C Diff interpretation No C. difficile detected.  Final  Urine culture     Status: Abnormal   Collection Time: 02/05/17  2:43 PM  Result Value Ref Range Status   Specimen Description URINE, RANDOM  Final   Special Requests NONE  Final   Culture MULTIPLE SPECIES PRESENT, SUGGEST RECOLLECTION (A)  Final   Report Status 02/07/2017 FINAL  Final  MRSA PCR Screening     Status: None   Collection  Time: 02/05/17 10:15 PM  Result Value Ref Range Status   MRSA by PCR NEGATIVE NEGATIVE Final    Comment:        The GeneXpert MRSA Assay (FDA approved for NASAL specimens only), is one component of a comprehensive MRSA colonization surveillance program. It is not intended to diagnose MRSA  infection nor to guide or monitor treatment for MRSA infections.      Labs: Basic Metabolic Panel:  Recent Labs Lab 02/05/17 1210 02/06/17 0959 02/07/17 0404 02/08/17 0357  NA 140 140 140 139  K 3.2* 3.5 3.9 4.0  CL 107 110 111 111  CO2 21* 22 22 23   GLUCOSE 94 116* 92 105*  BUN 37* 32* 26* 14  CREATININE 0.70 0.68 0.53* 0.49*  CALCIUM 9.2 8.9 8.7* 8.8*   Liver Function Tests:  Recent Labs Lab 02/05/17 1210  AST 36  ALT 47  ALKPHOS 92  BILITOT 0.4  PROT 8.4*  ALBUMIN 3.3*   No results for input(s): LIPASE, AMYLASE in the last 168 hours. No results for input(s): AMMONIA in the last 168 hours. CBC:  Recent Labs Lab 02/03/17 1313 02/05/17 1210 02/06/17 0300 02/06/17 0959 02/07/17 0404 02/08/17 0357  WBC 9.1 13.5*  --   --  8.0 7.6  NEUTROABS 6.0 10.4*  --   --   --   --   HGB 11.6* 10.7* 9.8* 10.1* 9.0* 8.9*  HCT 35.9* 34.4* 31.2* 32.6* 29.0* 28.7*  MCV 80.0 82.3  --   --  82.9 82.2  PLT 346 393  --   --  326 341   Cardiac Enzymes: No results for input(s): CKTOTAL, CKMB, CKMBINDEX, TROPONINI in the last 168 hours. BNP: BNP (last 3 results) No results for input(s): BNP in the last 8760 hours.  ProBNP (last 3 results) No results for input(s): PROBNP in the last 8760 hours.  CBG: No results for input(s): GLUCAP in the last 168 hours.

## 2017-02-08 NOTE — Progress Notes (Signed)
[name] [name] 9:54 AM  Subjective: Patient is doing well without any problems from his endoscopy yesterday and he is eating well and has no new complaints and no signs of bleeding  Objective: Vital signs stable afebrile no acute distress abdomen is soft nontender hemoglobin stable BUN decreased  Assessment: Improved  Plan: Happy to see back when necessary continue present management and happy to see in the office when necessary and please call if any further question or problem at this hospital stay  [facility]  Pager [phone] After 5PM or if no answer call [phone]

## 2017-02-08 NOTE — Progress Notes (Signed)
Spoke with pt concerning discharge and transportation home. Pt's sister will be home at 6:00 PM. [facility] will be schedule for pick up around.

## 2017-02-08 NOTE — Discharge Instructions (Signed)
Pantoprazole tablets What is this medicine? PANTOPRAZOLE (pan TOE pra zole) prevents the production of acid in the stomach. It is used to treat gastroesophageal reflux disease (GERD), inflammation of the esophagus, and Zollinger-Ellison syndrome. This medicine may be used for other purposes; ask your health care provider or pharmacist if you have questions. COMMON BRAND NAME(S): Protonix What should I tell my health care provider before I take this medicine? They need to know if you have any of these conditions: -liver disease -low levels of magnesium in the blood -lupus -an unusual or allergic reaction to omeprazole, lansoprazole, pantoprazole, rabeprazole, other medicines, foods, dyes, or preservatives -pregnant or trying to get pregnant -breast-feeding How should I use this medicine? Take this medicine by mouth. Swallow the tablets whole with a drink of water. Follow the directions on the prescription label. Do not crush, break, or chew. Take your medicine at regular intervals. Do not take your medicine more often than directed. Talk to your pediatrician regarding the use of this medicine in children. While this drug may be prescribed for children as young as 5 years for selected conditions, precautions do apply. Overdosage: If you think you have taken too much of this medicine contact a poison control center or emergency room at once. NOTE: This medicine is only for you. Do not share this medicine with others. What if I miss a dose? If you miss a dose, take it as soon as you can. If it is almost time for your next dose, take only that dose. Do not take double or extra doses. What may interact with this medicine? Do not take this medicine with any of the following medications: -atazanavir -nelfinavir This medicine may also interact with the following medications: -ampicillin -delavirdine -erlotinib -iron salts -medicines for fungal infections like ketoconazole, itraconazole and  voriconazole -methotrexate -mycophenolate mofetil -warfarin This list may not describe all possible interactions. Give your health care provider a list of all the medicines, herbs, non-prescription drugs, or dietary supplements you use. Also tell them if you smoke, drink alcohol, or use illegal drugs. Some items may interact with your medicine. What should I watch for while using this medicine? It can take several days before your stomach pain gets better. Check with your doctor or health care professional if your condition does not start to get better, or if it gets worse. You may need blood work done while you are taking this medicine. What side effects may I notice from receiving this medicine? Side effects that you should report to your doctor or health care professional as soon as possible: -allergic reactions like skin rash, itching or hives, swelling of the face, lips, or tongue -bone, muscle or joint pain -breathing problems -chest pain or chest tightness -dark yellow or brown urine -dizziness -fast, irregular heartbeat -feeling faint or lightheaded -fever or sore throat -muscle spasm -palpitations -rash on cheeks or arms that gets worse in the sun -redness, blistering, peeling or loosening of the skin, including inside the mouth -seizures -tremors -unusual bleeding or bruising -unusually weak or tired -yellowing of the eyes or skin Side effects that usually do not require medical attention (report to your doctor or health care professional if they continue or are bothersome): -constipation -diarrhea -dry mouth -headache -nausea This list may not describe all possible side effects. Call your doctor for medical advice about side effects. You may report side effects to FDA at 1-800-FDA-1088. Where should I keep my medicine? Keep out of the reach of children. Store at room   temperature between 15 and 30 degrees C (59 and 86 degrees F). Protect from light and moisture. Throw  away any unused medicine after the expiration date. NOTE: This sheet is a summary. It may not cover all possible information. If you have questions about this medicine, talk to your doctor, pharmacist, or health care provider.  2018 Elsevier/Gold Standard (2015-06-04 12:20:19)  

## 2017-02-08 NOTE — Progress Notes (Signed)
Spoke with Case management--[facility] arranged for pickup. SRP, RN

## 2017-02-10 DIAGNOSIS — S31102A Unspecified open wound of abdominal wall, epigastric region without penetration into peritoneal cavity, initial encounter: Secondary | ICD-10-CM | POA: Diagnosis not present

## 2017-02-10 DIAGNOSIS — S31809A Unspecified open wound of unspecified buttock, initial encounter: Secondary | ICD-10-CM | POA: Diagnosis not present

## 2017-02-10 DIAGNOSIS — L8944 Pressure ulcer of contiguous site of back, buttock and hip, stage 4: Secondary | ICD-10-CM | POA: Diagnosis not present

## 2017-02-10 DIAGNOSIS — Z933 Colostomy status: Secondary | ICD-10-CM | POA: Diagnosis not present

## 2017-02-13 ENCOUNTER — Encounter (HOSPITAL_BASED_OUTPATIENT_CLINIC_OR_DEPARTMENT_OTHER): Payer: Medicare Other | Attending: Internal Medicine

## 2017-02-13 DIAGNOSIS — S81802A Unspecified open wound, left lower leg, initial encounter: Secondary | ICD-10-CM | POA: Insufficient documentation

## 2017-02-13 DIAGNOSIS — X58XXXA Exposure to other specified factors, initial encounter: Secondary | ICD-10-CM | POA: Insufficient documentation

## 2017-02-13 DIAGNOSIS — L89612 Pressure ulcer of right heel, stage 2: Secondary | ICD-10-CM | POA: Insufficient documentation

## 2017-02-13 DIAGNOSIS — L89894 Pressure ulcer of other site, stage 4: Secondary | ICD-10-CM | POA: Diagnosis not present

## 2017-02-13 DIAGNOSIS — S91301A Unspecified open wound, right foot, initial encounter: Secondary | ICD-10-CM | POA: Insufficient documentation

## 2017-02-13 DIAGNOSIS — L89154 Pressure ulcer of sacral region, stage 4: Secondary | ICD-10-CM | POA: Insufficient documentation

## 2017-02-13 DIAGNOSIS — S31104A Unspecified open wound of abdominal wall, left lower quadrant without penetration into peritoneal cavity, initial encounter: Secondary | ICD-10-CM | POA: Insufficient documentation

## 2017-02-14 DIAGNOSIS — L89309 Pressure ulcer of unspecified buttock, unspecified stage: Secondary | ICD-10-CM | POA: Diagnosis not present

## 2017-02-15 ENCOUNTER — Ambulatory Visit: Payer: Medicare Other | Admitting: Infectious Diseases

## 2017-02-15 DIAGNOSIS — L89309 Pressure ulcer of unspecified buttock, unspecified stage: Secondary | ICD-10-CM | POA: Diagnosis not present

## 2017-02-16 DIAGNOSIS — L89159 Pressure ulcer of sacral region, unspecified stage: Secondary | ICD-10-CM | POA: Diagnosis not present

## 2017-02-16 DIAGNOSIS — L89119 Pressure ulcer of right upper back, unspecified stage: Secondary | ICD-10-CM | POA: Diagnosis not present

## 2017-02-16 DIAGNOSIS — A419 Sepsis, unspecified organism: Secondary | ICD-10-CM | POA: Diagnosis not present

## 2017-02-16 DIAGNOSIS — G8250 Quadriplegia, unspecified: Secondary | ICD-10-CM | POA: Diagnosis not present

## 2017-02-19 ENCOUNTER — Other Ambulatory Visit: Payer: Self-pay | Admitting: Adult Health

## 2017-02-20 ENCOUNTER — Telehealth: Payer: Self-pay

## 2017-02-20 MED ORDER — FERROUS SULFATE 325 (65 FE) MG PO TBEC: 325.0000 mg | DELAYED_RELEASE_TABLET | Freq: Three times a day (TID) | ORAL | 3 refills | Status: DC

## 2017-02-20 NOTE — Telephone Encounter (Signed)
Pt requested ferrous sulfate refill. Done per OV note 02/03/17

## 2017-02-21 ENCOUNTER — Telehealth: Payer: Self-pay | Admitting: Hematology

## 2017-02-21 NOTE — Telephone Encounter (Signed)
02/19/17 prescription refill scanned in media

## 2017-02-26 IMAGING — CR DG CHEST 2V
2 series · 2 of 2 positions shown · non-contrast
Comparison: 05/10/2014

CLINICAL DATA: Fever and chills.  Patient is paraplegic.

EXAM:
CHEST  2 VIEW

[w chest lat]
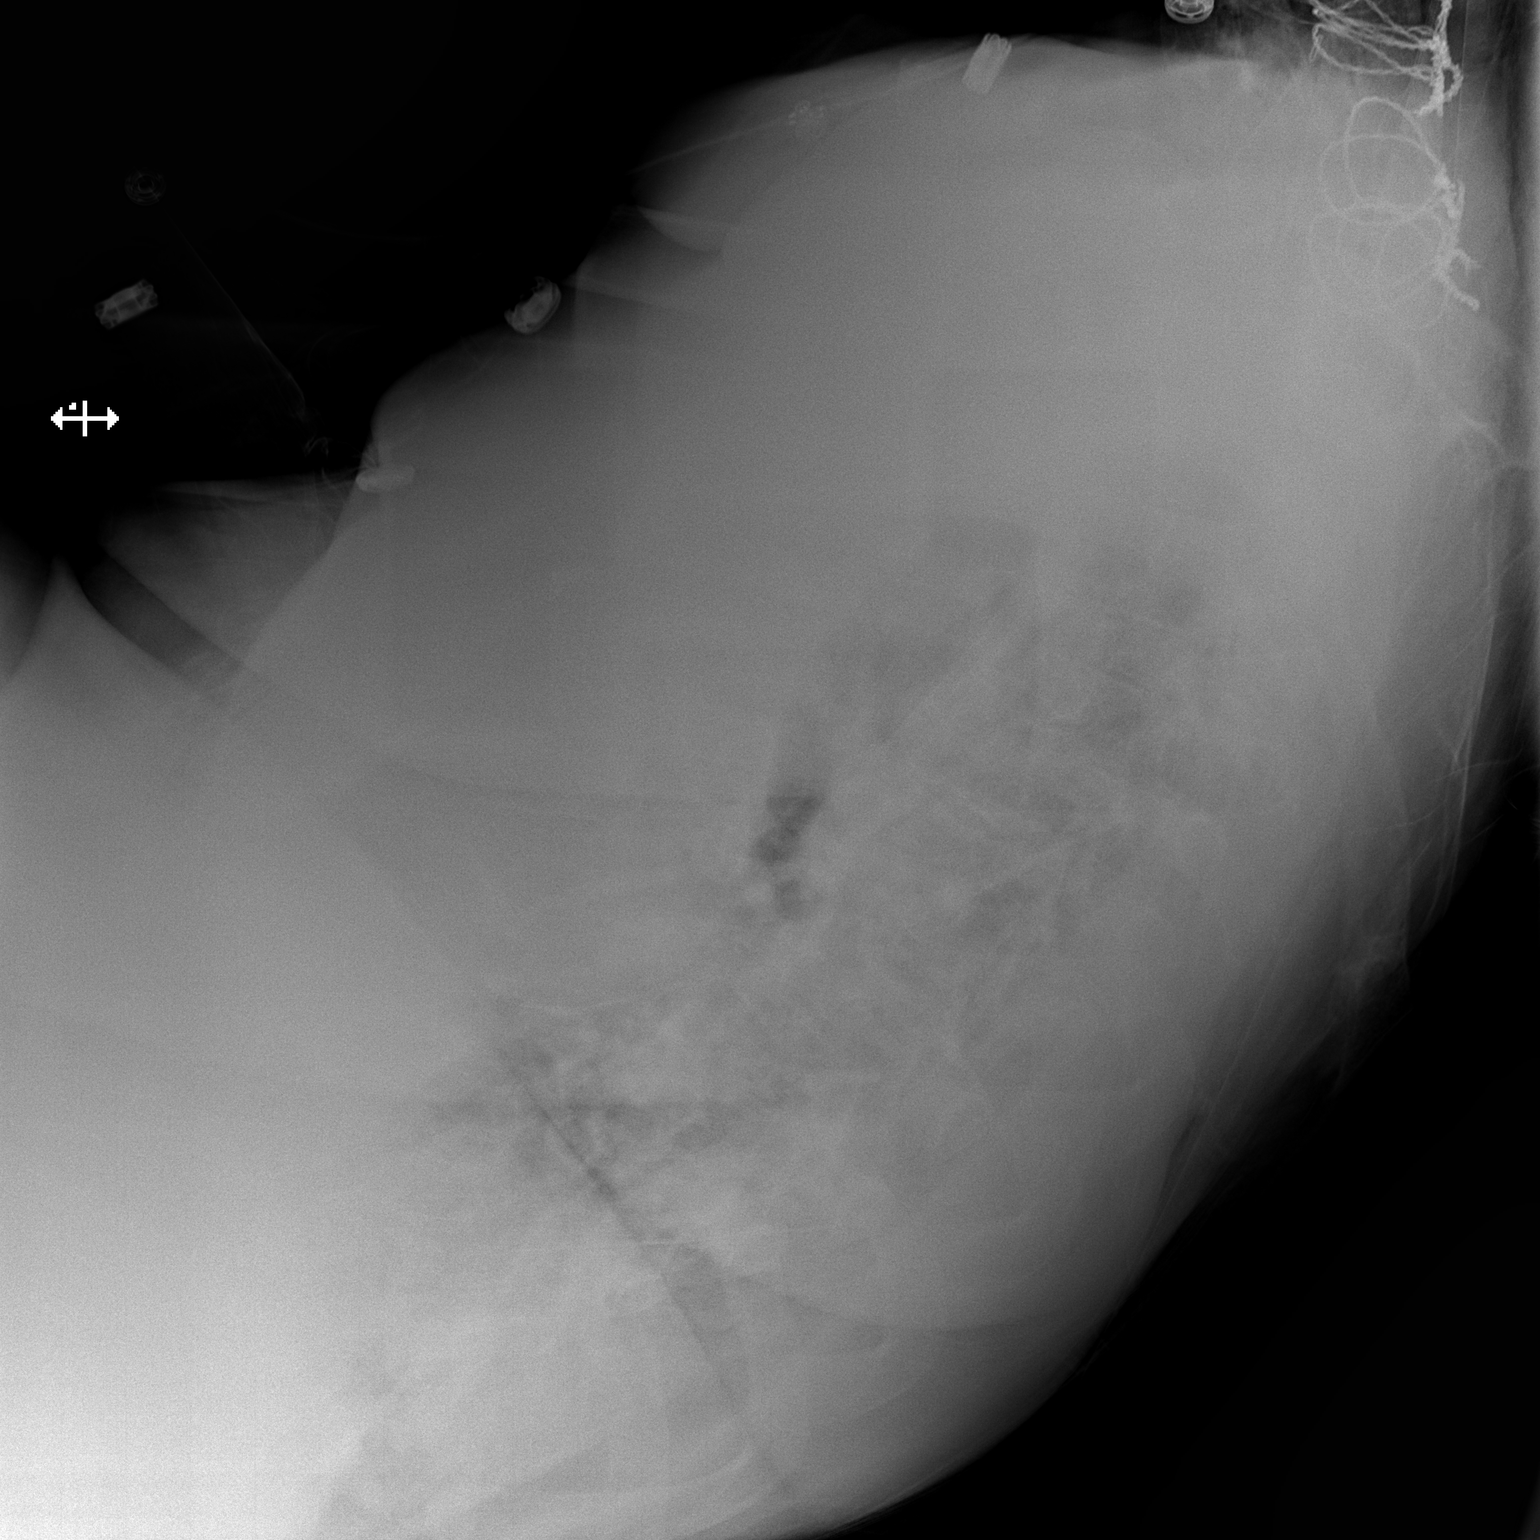

[x chest ap]
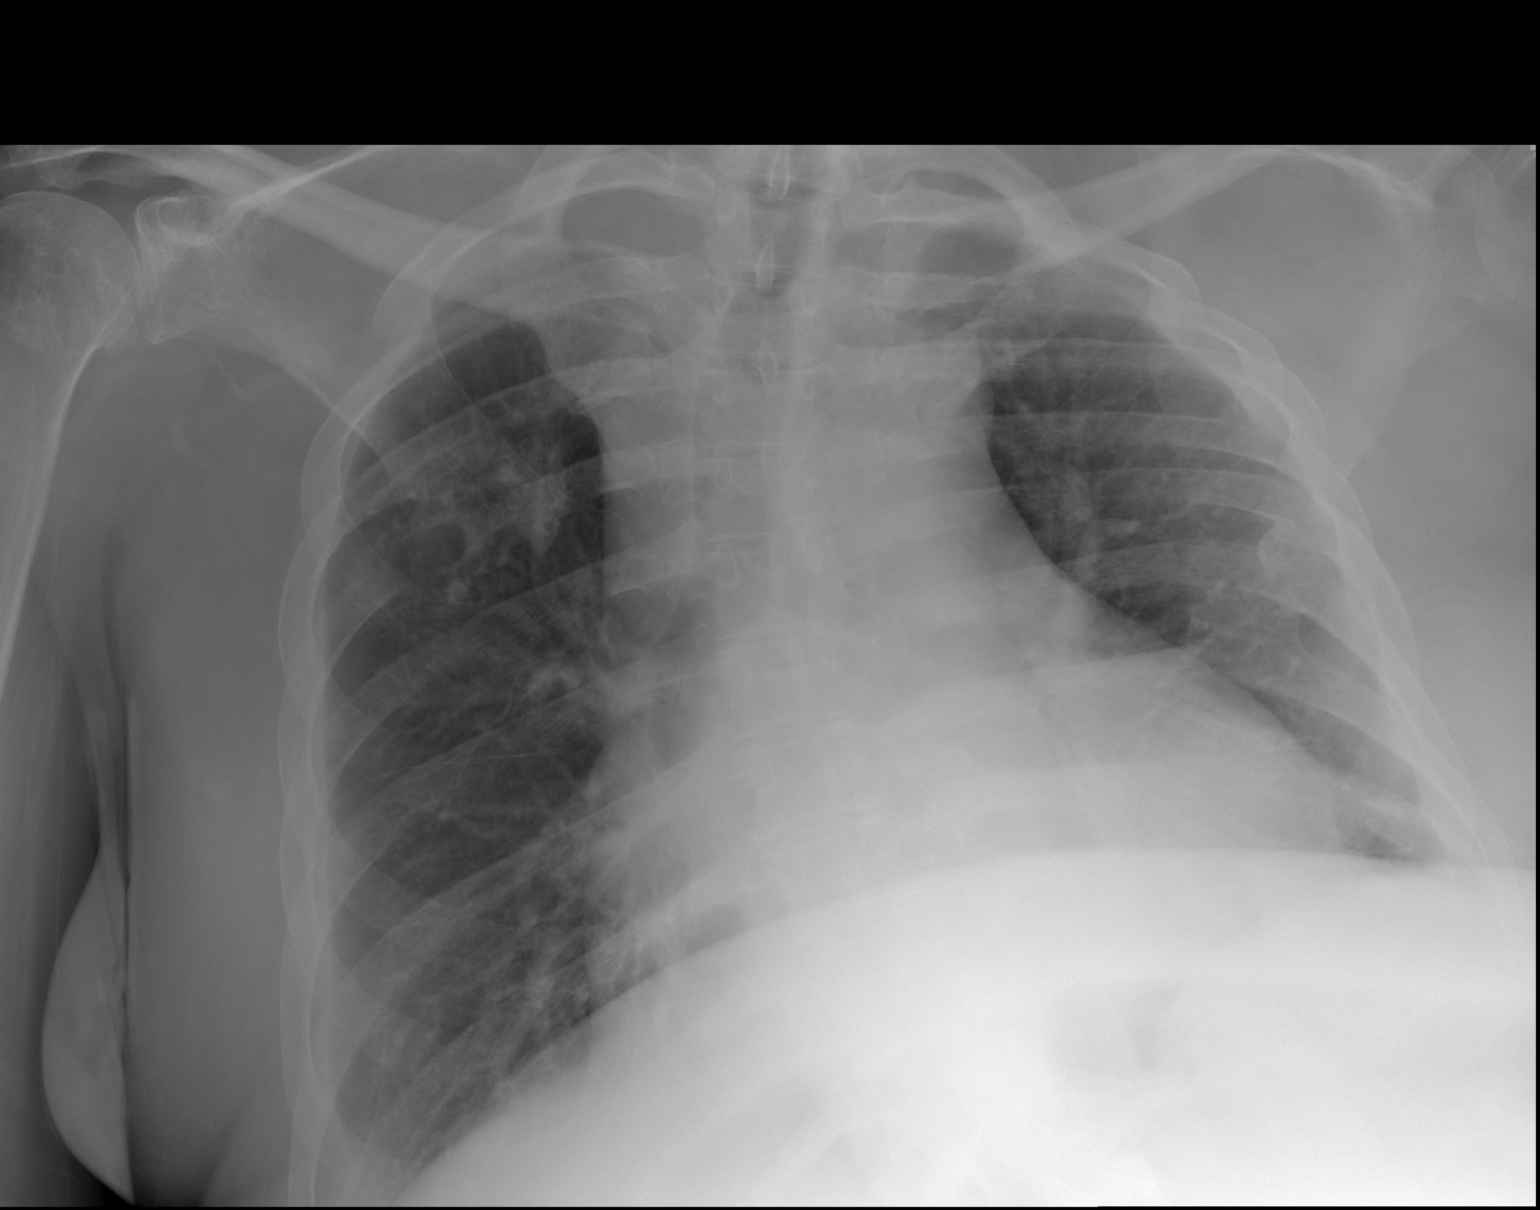

[2 of 2 positions shown; findings below may reference images not displayed]

FINDINGS: Chronic densities in the right upper chest have been present since
07/18/2011. Heart size is upper limits of normal but unchanged. The
trachea is midline. Surgical wires in the lower cervical spine.
Lateral view is very limited. Visualized lungs are clear. Lung bases
are incompletely imaged.
IMPRESSION: No acute chest abnormality.

## 2017-02-26 IMAGING — CR DG PELVIS 1-2V
3 series · 3 of 3 positions shown · non-contrast
Comparison: Radiographs dated 05/10/2014 and CT scan dated
04/17/2014

CLINICAL DATA: Fever.  Sacral decubitus ulcers.

EXAM:
PELVIS - 1-2 VIEW

[x pelvis (1 of 3)]
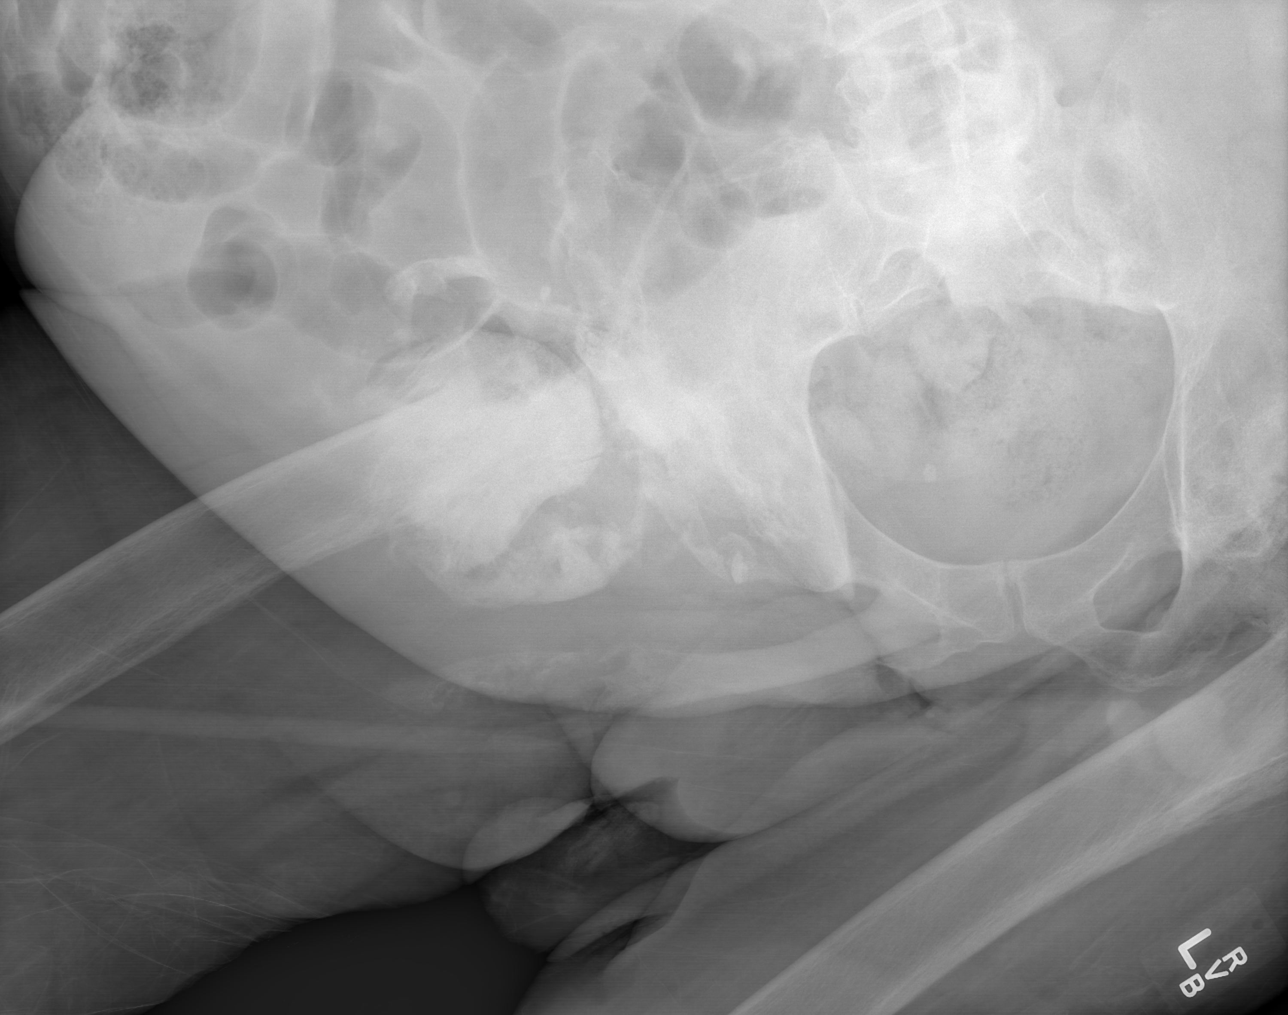

[x pelvis (2 of 3)]
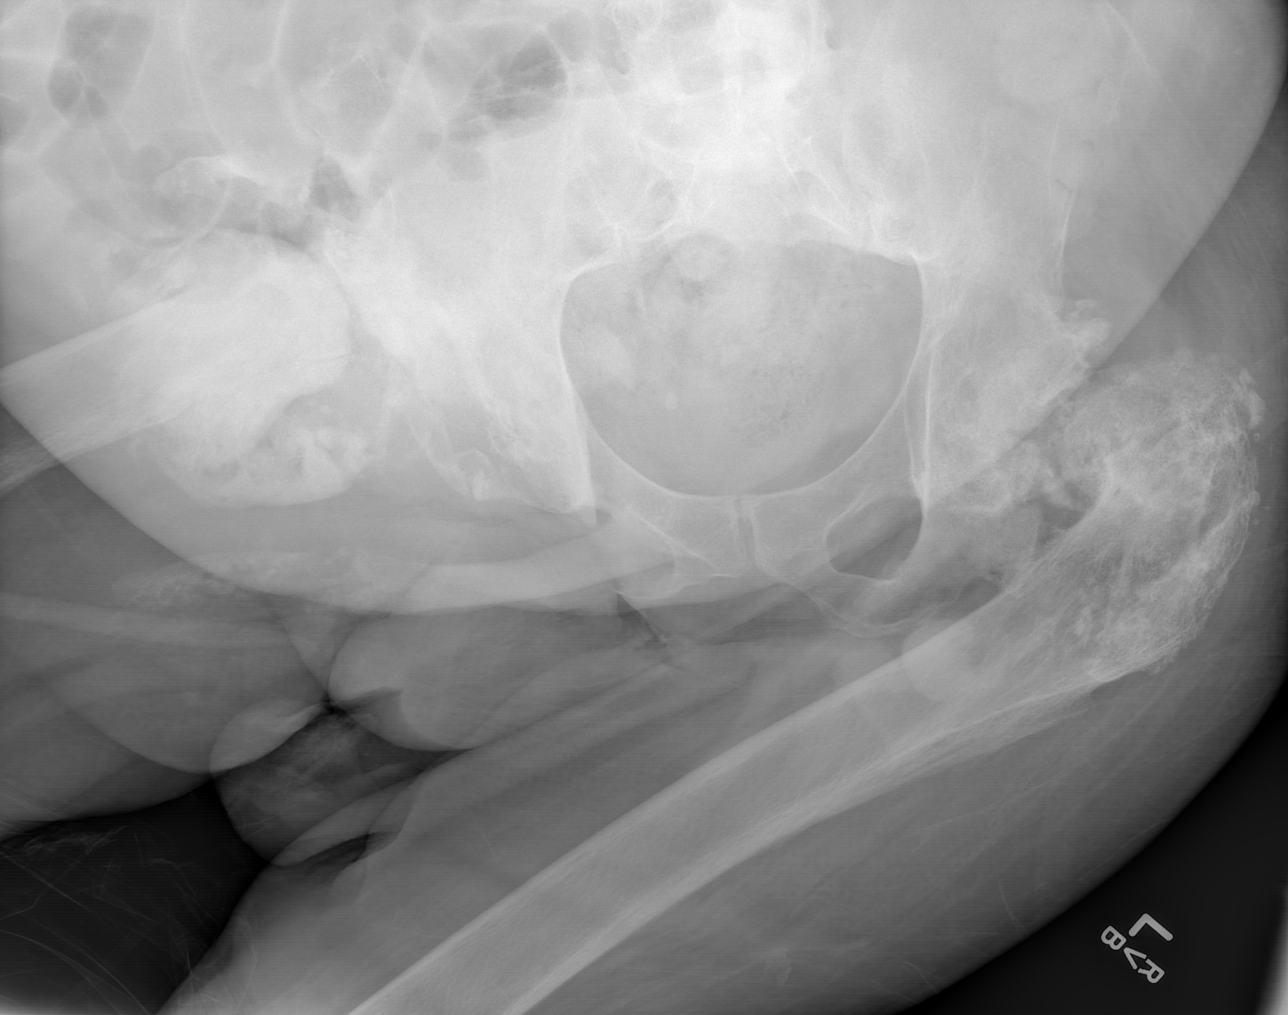

[x pelvis (3 of 3)]
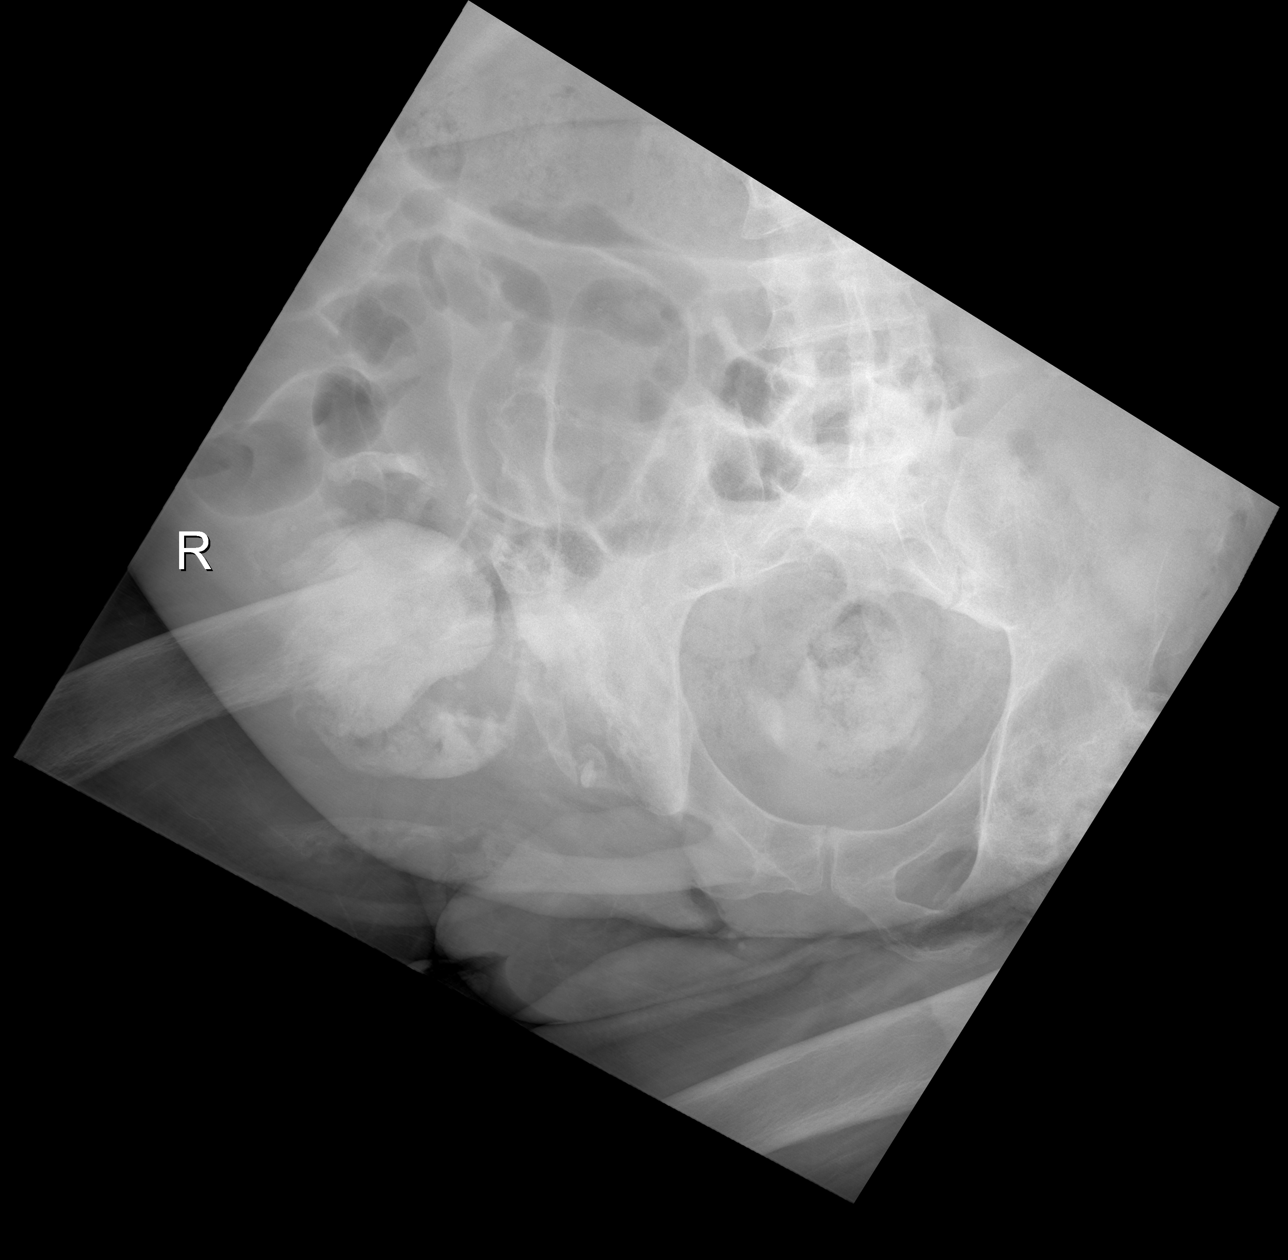

[3 of 3 positions shown; findings below may reference images not displayed]

FINDINGS: There is severe chronic degenerative erosive and dystrophic changes
of both hips with chronic dislocation of both proximal femurs.
Previous resection or erosion of right inferior pubic ramus adjacent
to the decubitus ulcer. Chronic fusion of both sacroiliac joints.

Chronic deformity of the right iliac bone.
IMPRESSION: No appreciable change in the appearance of the pelvis and hips since
the prior studies of 05/10/2014 and 04/17/2014.

## 2017-02-27 DIAGNOSIS — L89893 Pressure ulcer of other site, stage 3: Secondary | ICD-10-CM | POA: Diagnosis not present

## 2017-02-27 DIAGNOSIS — L89613 Pressure ulcer of right heel, stage 3: Secondary | ICD-10-CM | POA: Diagnosis not present

## 2017-02-27 DIAGNOSIS — L89154 Pressure ulcer of sacral region, stage 4: Secondary | ICD-10-CM | POA: Diagnosis not present

## 2017-02-27 DIAGNOSIS — S91301A Unspecified open wound, right foot, initial encounter: Secondary | ICD-10-CM | POA: Diagnosis not present

## 2017-02-28 ENCOUNTER — Other Ambulatory Visit: Payer: Self-pay | Admitting: Adult Health

## 2017-02-28 ENCOUNTER — Encounter (HOSPITAL_COMMUNITY): Payer: Self-pay

## 2017-02-28 ENCOUNTER — Inpatient Hospital Stay (HOSPITAL_COMMUNITY)
Admission: EM | Admit: 2017-02-28 | Discharge: 2017-03-02 | DRG: 592 | Disposition: A | Discharge status: Home / Self Care | Payer: Medicare Other | Attending: Family Medicine | Admitting: Family Medicine

## 2017-02-28 ENCOUNTER — Observation Stay (HOSPITAL_COMMUNITY): Payer: Medicare Other

## 2017-02-28 DIAGNOSIS — M8660 Other chronic osteomyelitis, unspecified site: Secondary | ICD-10-CM | POA: Diagnosis present

## 2017-02-28 DIAGNOSIS — G8250 Quadriplegia, unspecified: Secondary | ICD-10-CM | POA: Diagnosis not present

## 2017-02-28 DIAGNOSIS — R6510 Systemic inflammatory response syndrome (SIRS) of non-infectious origin without acute organ dysfunction: Secondary | ICD-10-CM | POA: Diagnosis present

## 2017-02-28 DIAGNOSIS — D631 Anemia in chronic kidney disease: Secondary | ICD-10-CM | POA: Diagnosis not present

## 2017-02-28 DIAGNOSIS — Z79899 Other long term (current) drug therapy: Secondary | ICD-10-CM

## 2017-02-28 DIAGNOSIS — N390 Urinary tract infection, site not specified: Secondary | ICD-10-CM | POA: Diagnosis present

## 2017-02-28 DIAGNOSIS — Z881 Allergy status to other antibiotic agents status: Secondary | ICD-10-CM

## 2017-02-28 DIAGNOSIS — T83510A Infection and inflammatory reaction due to cystostomy catheter, initial encounter: Secondary | ICD-10-CM | POA: Diagnosis not present

## 2017-02-28 DIAGNOSIS — E860 Dehydration: Secondary | ICD-10-CM | POA: Diagnosis not present

## 2017-02-28 DIAGNOSIS — L89159 Pressure ulcer of sacral region, unspecified stage: Secondary | ICD-10-CM | POA: Diagnosis present

## 2017-02-28 DIAGNOSIS — M4628 Osteomyelitis of vertebra, sacral and sacrococcygeal region: Secondary | ICD-10-CM | POA: Diagnosis present

## 2017-02-28 DIAGNOSIS — E662 Morbid (severe) obesity with alveolar hypoventilation: Secondary | ICD-10-CM | POA: Diagnosis present

## 2017-02-28 DIAGNOSIS — L89209 Pressure ulcer of unspecified hip, unspecified stage: Secondary | ICD-10-CM | POA: Diagnosis present

## 2017-02-28 DIAGNOSIS — L89154 Pressure ulcer of sacral region, stage 4: Principal | ICD-10-CM | POA: Diagnosis present

## 2017-02-28 DIAGNOSIS — T148XXA Other injury of unspecified body region, initial encounter: Secondary | ICD-10-CM | POA: Diagnosis not present

## 2017-02-28 DIAGNOSIS — L089 Local infection of the skin and subcutaneous tissue, unspecified: Secondary | ICD-10-CM | POA: Diagnosis not present

## 2017-02-28 DIAGNOSIS — Z933 Colostomy status: Secondary | ICD-10-CM | POA: Diagnosis not present

## 2017-02-28 DIAGNOSIS — E876 Hypokalemia: Secondary | ICD-10-CM

## 2017-02-28 DIAGNOSIS — Z6827 Body mass index (BMI) 27.0-27.9, adult: Secondary | ICD-10-CM

## 2017-02-28 DIAGNOSIS — L8994 Pressure ulcer of unspecified site, stage 4: Secondary | ICD-10-CM | POA: Diagnosis present

## 2017-02-28 DIAGNOSIS — L89899 Pressure ulcer of other site, unspecified stage: Secondary | ICD-10-CM | POA: Diagnosis not present

## 2017-02-28 DIAGNOSIS — G933 Postviral fatigue syndrome: Secondary | ICD-10-CM | POA: Diagnosis present

## 2017-02-28 DIAGNOSIS — Z9359 Other cystostomy status: Secondary | ICD-10-CM | POA: Diagnosis not present

## 2017-02-28 DIAGNOSIS — I129 Hypertensive chronic kidney disease with stage 1 through stage 4 chronic kidney disease, or unspecified chronic kidney disease: Secondary | ICD-10-CM | POA: Diagnosis present

## 2017-02-28 DIAGNOSIS — Z888 Allergy status to other drugs, medicaments and biological substances status: Secondary | ICD-10-CM

## 2017-02-28 DIAGNOSIS — R509 Fever, unspecified: Secondary | ICD-10-CM | POA: Diagnosis not present

## 2017-02-28 DIAGNOSIS — D649 Anemia, unspecified: Secondary | ICD-10-CM

## 2017-02-28 DIAGNOSIS — E43 Unspecified severe protein-calorie malnutrition: Secondary | ICD-10-CM | POA: Diagnosis not present

## 2017-02-28 DIAGNOSIS — J9610 Chronic respiratory failure, unspecified whether with hypoxia or hypercapnia: Secondary | ICD-10-CM | POA: Diagnosis present

## 2017-02-28 DIAGNOSIS — L89314 Pressure ulcer of right buttock, stage 4: Secondary | ICD-10-CM | POA: Diagnosis not present

## 2017-02-28 LAB — CBC WITH DIFFERENTIAL/PLATELET
BASOS ABS: 0 10*3/uL (ref 0.0–0.1)
BASOS PCT: 0 %
EOS PCT: 2 %
Eosinophils Absolute: 0.2 10*3/uL (ref 0.0–0.7)
HEMATOCRIT: 27.8 % — AB (ref 39.0–52.0)
Hemoglobin: 8.9 g/dL — ABNORMAL LOW (ref 13.0–17.0)
LYMPHS PCT: 16 %
Lymphs Abs: 2 10*3/uL (ref 0.7–4.0)
MCH: 25.6 pg — ABNORMAL LOW (ref 26.0–34.0)
MCHC: 32 g/dL (ref 30.0–36.0)
MCV: 80.1 fL (ref 78.0–100.0)
Monocytes Absolute: 1 10*3/uL (ref 0.1–1.0)
Monocytes Relative: 8 %
NEUTROS ABS: 9.5 10*3/uL — AB (ref 1.7–7.7)
Neutrophils Relative %: 74 %
PLATELETS: 320 10*3/uL (ref 150–400)
RBC: 3.47 MIL/uL — AB (ref 4.22–5.81)
RDW: 15.5 % (ref 11.5–15.5)
WBC: 12.9 10*3/uL — AB (ref 4.0–10.5)

## 2017-02-28 LAB — BASIC METABOLIC PANEL
ANION GAP: 10 (ref 5–15)
BUN: 20 mg/dL (ref 6–20)
CALCIUM: 8.3 mg/dL — AB (ref 8.9–10.3)
CO2: 24 mmol/L (ref 22–32)
Chloride: 106 mmol/L (ref 101–111)
Creatinine, Ser: 0.63 mg/dL (ref 0.61–1.24)
GFR calc Af Amer: >60 mL/min (ref >60)
Glucose, Bld: 122 mg/dL — ABNORMAL HIGH (ref 65–99)
POTASSIUM: 3.3 mmol/L — AB (ref 3.5–5.1)
SODIUM: 140 mmol/L (ref 135–145)

## 2017-02-28 LAB — I-STAT CG4 LACTIC ACID, ED: LACTIC ACID, VENOUS: 0.61 mmol/L (ref 0.5–1.9)

## 2017-02-28 LAB — C-REACTIVE PROTEIN: CRP: 15.4 mg/dL — ABNORMAL HIGH (ref <1.0)

## 2017-02-28 LAB — SEDIMENTATION RATE: Sed Rate: 74 mm/hr — ABNORMAL HIGH (ref 0–16)

## 2017-02-28 MED ORDER — ZINC 50 MG PO TABS: 50.0000 mg | ORAL_TABLET | Freq: Two times a day (BID) | ORAL | Status: DC

## 2017-02-28 MED ORDER — METOCLOPRAMIDE HCL 10 MG PO TABS
10.0000 mg | ORAL_TABLET | Freq: Four times a day (QID) | ORAL | Status: DC
  Administered 2017-02-28 – 2017-03-02 (×8): 10 mg via ORAL
  Filled 2017-02-28 (×8): qty 1

## 2017-02-28 MED ORDER — PANTOPRAZOLE SODIUM 40 MG PO TBEC
40.0000 mg | DELAYED_RELEASE_TABLET | Freq: Two times a day (BID) | ORAL | Status: DC
  Administered 2017-03-01 – 2017-03-02 (×4): 40 mg via ORAL
  Filled 2017-02-28 (×4): qty 1

## 2017-02-28 MED ORDER — VITAMIN C 500 MG PO TABS
500.0000 mg | ORAL_TABLET | Freq: Every morning | ORAL | Status: DC
  Administered 2017-03-01 – 2017-03-02 (×2): 500 mg via ORAL
  Filled 2017-02-28 (×2): qty 1

## 2017-02-28 MED ORDER — POTASSIUM CHLORIDE CRYS ER 20 MEQ PO TBCR
20.0000 meq | EXTENDED_RELEASE_TABLET | Freq: Two times a day (BID) | ORAL | Status: DC
  Administered 2017-02-28 – 2017-03-02 (×4): 20 meq via ORAL
  Filled 2017-02-28 (×4): qty 1

## 2017-02-28 MED ORDER — SUCRALFATE 1 G PO TABS
1.0000 g | ORAL_TABLET | Freq: Three times a day (TID) | ORAL | Status: DC
  Administered 2017-02-28 – 2017-03-02 (×8): 1 g via ORAL
  Filled 2017-02-28 (×8): qty 1

## 2017-02-28 MED ORDER — POTASSIUM CHLORIDE CRYS ER 20 MEQ PO TBCR
40.0000 meq | EXTENDED_RELEASE_TABLET | Freq: Once | ORAL | Status: AC
  Administered 2017-02-28: 40 meq via ORAL
  Filled 2017-02-28: qty 2

## 2017-02-28 MED ORDER — ADULT MULTIVITAMIN W/MINERALS CH
1.0000 | ORAL_TABLET | Freq: Every morning | ORAL | Status: DC
  Administered 2017-03-01 – 2017-03-02 (×2): 1 via ORAL
  Filled 2017-02-28 (×2): qty 1

## 2017-02-28 MED ORDER — PIPERACILLIN-TAZOBACTAM 3.375 G IVPB
3.3750 g | Freq: Three times a day (TID) | INTRAVENOUS | Status: DC
  Administered 2017-03-01 – 2017-03-02 (×5): 3.375 g via INTRAVENOUS
  Filled 2017-02-28 (×5): qty 50

## 2017-02-28 MED ORDER — SODIUM CHLORIDE 0.9 % IV BOLUS (SEPSIS)
1000.0000 mL | Freq: Once | INTRAVENOUS | Status: AC
  Administered 2017-02-28: 1000 mL via INTRAVENOUS

## 2017-02-28 MED ORDER — MAGNESIUM OXIDE 400 (241.3 MG) MG PO TABS
400.0000 mg | ORAL_TABLET | Freq: Every day | ORAL | Status: DC
  Administered 2017-03-01 – 2017-03-02 (×2): 400 mg via ORAL
  Filled 2017-02-28 (×2): qty 1

## 2017-02-28 MED ORDER — PIPERACILLIN-TAZOBACTAM 3.375 G IVPB 30 MIN
3.3750 g | Freq: Once | INTRAVENOUS | Status: AC
  Administered 2017-02-28: 3.375 g via INTRAVENOUS
  Filled 2017-02-28: qty 50

## 2017-02-28 MED ORDER — FERROUS SULFATE 325 (65 FE) MG PO TABS
325.0000 mg | ORAL_TABLET | Freq: Three times a day (TID) | ORAL | Status: DC
  Administered 2017-03-01 – 2017-03-02 (×6): 325 mg via ORAL
  Filled 2017-02-28 (×6): qty 1

## 2017-02-28 MED ORDER — SODIUM CHLORIDE 0.9 % IV SOLN
400.0000 mg | Freq: Every day | INTRAVENOUS | Status: DC
  Administered 2017-02-28 – 2017-03-01 (×2): 400 mg via INTRAVENOUS
  Filled 2017-02-28 (×3): qty 8

## 2017-02-28 MED ORDER — BACLOFEN 20 MG PO TABS
20.0000 mg | ORAL_TABLET | Freq: Four times a day (QID) | ORAL | Status: DC
  Administered 2017-02-28 – 2017-03-02 (×8): 20 mg via ORAL
  Filled 2017-02-28 (×8): qty 1

## 2017-02-28 MED ORDER — ENOXAPARIN SODIUM 40 MG/0.4ML ~~LOC~~ SOLN
40.0000 mg | SUBCUTANEOUS | Status: DC
  Administered 2017-02-28 – 2017-03-01 (×2): 40 mg via SUBCUTANEOUS
  Filled 2017-02-28 (×2): qty 0.4

## 2017-02-28 MED ORDER — ZINC SULFATE 220 (50 ZN) MG PO CAPS
220.0000 mg | ORAL_CAPSULE | Freq: Two times a day (BID) | ORAL | Status: DC
  Administered 2017-02-28 – 2017-03-02 (×4): 220 mg via ORAL
  Filled 2017-02-28 (×5): qty 1

## 2017-02-28 MED ORDER — CLINDAMYCIN PHOSPHATE 600 MG/50ML IV SOLN
600.0000 mg | Freq: Once | INTRAVENOUS | Status: AC
  Administered 2017-02-28: 600 mg via INTRAVENOUS
  Filled 2017-02-28: qty 50

## 2017-02-28 MED ORDER — FESOTERODINE FUMARATE ER 8 MG PO TB24
8.0000 mg | ORAL_TABLET | Freq: Every day | ORAL | Status: DC
  Administered 2017-03-01 – 2017-03-02 (×2): 8 mg via ORAL
  Filled 2017-02-28 (×2): qty 1

## 2017-02-28 MED ORDER — FUROSEMIDE 20 MG PO TABS: 20.0000 mg | ORAL_TABLET | Freq: Two times a day (BID) | ORAL | Status: DC | PRN

## 2017-02-28 NOTE — Progress Notes (Addendum)
Pharmacy Antibiotic Note  [name] [name] is a 51 y.o. 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 Patient has been seen multiple times in past few years for same, including bone involvement. Noted with acute renal injury from vancomycin in January 2018; admitting MD discussed with ID and will treat with Daptomycin and Zosyn per pharmacy.  Plan:  Daptomycin 400 mg (~4 mg/kg) IV q24 hr  Zosyn 3.375 g IV given once over 30 minutes, then every 8 hrs by 4-hr infusion  CK at baseline and weekly while on daptomycin  Narrow as clinically appropriate  If imaging shows bone involvement, would increase daptomycin to at least 6 mg/kg (TBW)   Height: 6' (182.9 cm) Weight: 202 lb (91.6 kg) IBW/kg (Calculated) : 77.6  Temp (24hrs), Avg:98.7 F (37.1 C), Min:98.7 F (37.1 C), Max:98.8 F (37.1 C)   Recent Labs Lab 02/28/17 1404 02/28/17 1413  WBC 12.9*  --   CREATININE 0.63  --   LATICACIDVEN  --  0.61    Estimated Creatinine Clearance: 121.3 mL/min (by C-G formula based on SCr of 0.63 mg/dL).    Allergies  Allergen Reactions  . Feraheme [Ferumoxytol] Other (See Comments)    SYNCOPE  . Ditropan [Oxybutynin] Other (See Comments)    Hallucinations   . Vancomycin Other (See Comments)    ARF 05-2016    Antimicrobials this admission: Daptomycin 10/16 >>  Zosyn 10/16 >>   Dose adjustments this admission: ---   Thank you for allowing pharmacy to be a part of this patient's care.  Reuel Boom, PharmD, BCPS Pager: [phone] 02/28/2017, 9:28 PM
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
## 2017-02-28 NOTE — ED Triage Notes (Signed)
Patient here by ems with c/o fever started about 10 pm last night. Pt state he was at wound center yesterday and he had fever 99.5.  This morning he awake up with chills. Pt also state he does have subpubic catheter that is due to be change and he get it change every month at the doctor office.

## 2017-02-28 NOTE — ED Notes (Signed)
Bed: WA01 Expected date:  Expected time:  Means of arrival:  Comments: EMS-fever

## 2017-02-28 NOTE — ED Notes (Signed)
Unable to check rectal tempeture pt does not have an anal area due to colostomy bag.

## 2017-03-01 ENCOUNTER — Encounter (HOSPITAL_COMMUNITY): Payer: Self-pay | Admitting: Physician Assistant

## 2017-03-01 DIAGNOSIS — Z933 Colostomy status: Secondary | ICD-10-CM | POA: Diagnosis not present

## 2017-03-01 DIAGNOSIS — Z79899 Other long term (current) drug therapy: Secondary | ICD-10-CM | POA: Diagnosis not present

## 2017-03-01 DIAGNOSIS — L89209 Pressure ulcer of unspecified hip, unspecified stage: Secondary | ICD-10-CM | POA: Diagnosis present

## 2017-03-01 DIAGNOSIS — Z6827 Body mass index (BMI) 27.0-27.9, adult: Secondary | ICD-10-CM | POA: Diagnosis not present

## 2017-03-01 DIAGNOSIS — N390 Urinary tract infection, site not specified: Secondary | ICD-10-CM | POA: Diagnosis present

## 2017-03-01 DIAGNOSIS — Z888 Allergy status to other drugs, medicaments and biological substances status: Secondary | ICD-10-CM | POA: Diagnosis not present

## 2017-03-01 DIAGNOSIS — R6510 Systemic inflammatory response syndrome (SIRS) of non-infectious origin without acute organ dysfunction: Secondary | ICD-10-CM

## 2017-03-01 DIAGNOSIS — M8660 Other chronic osteomyelitis, unspecified site: Secondary | ICD-10-CM | POA: Diagnosis present

## 2017-03-01 DIAGNOSIS — G8250 Quadriplegia, unspecified: Secondary | ICD-10-CM | POA: Diagnosis present

## 2017-03-01 DIAGNOSIS — L89154 Pressure ulcer of sacral region, stage 4: Secondary | ICD-10-CM | POA: Diagnosis not present

## 2017-03-01 DIAGNOSIS — L89159 Pressure ulcer of sacral region, unspecified stage: Secondary | ICD-10-CM

## 2017-03-01 DIAGNOSIS — L89314 Pressure ulcer of right buttock, stage 4: Secondary | ICD-10-CM | POA: Diagnosis not present

## 2017-03-01 DIAGNOSIS — L8994 Pressure ulcer of unspecified site, stage 4: Secondary | ICD-10-CM | POA: Diagnosis present

## 2017-03-01 DIAGNOSIS — L089 Local infection of the skin and subcutaneous tissue, unspecified: Secondary | ICD-10-CM

## 2017-03-01 DIAGNOSIS — G933 Postviral fatigue syndrome: Secondary | ICD-10-CM | POA: Diagnosis present

## 2017-03-01 DIAGNOSIS — Z9359 Other cystostomy status: Secondary | ICD-10-CM | POA: Diagnosis not present

## 2017-03-01 DIAGNOSIS — E43 Unspecified severe protein-calorie malnutrition: Secondary | ICD-10-CM | POA: Diagnosis present

## 2017-03-01 DIAGNOSIS — I129 Hypertensive chronic kidney disease with stage 1 through stage 4 chronic kidney disease, or unspecified chronic kidney disease: Secondary | ICD-10-CM | POA: Diagnosis present

## 2017-03-01 DIAGNOSIS — J9610 Chronic respiratory failure, unspecified whether with hypoxia or hypercapnia: Secondary | ICD-10-CM | POA: Diagnosis present

## 2017-03-01 DIAGNOSIS — D631 Anemia in chronic kidney disease: Secondary | ICD-10-CM | POA: Diagnosis present

## 2017-03-01 DIAGNOSIS — M4628 Osteomyelitis of vertebra, sacral and sacrococcygeal region: Secondary | ICD-10-CM | POA: Diagnosis not present

## 2017-03-01 DIAGNOSIS — L89899 Pressure ulcer of other site, unspecified stage: Secondary | ICD-10-CM | POA: Diagnosis not present

## 2017-03-01 DIAGNOSIS — T83510A Infection and inflammatory reaction due to cystostomy catheter, initial encounter: Secondary | ICD-10-CM | POA: Diagnosis not present

## 2017-03-01 DIAGNOSIS — E662 Morbid (severe) obesity with alveolar hypoventilation: Secondary | ICD-10-CM | POA: Diagnosis present

## 2017-03-01 DIAGNOSIS — Z881 Allergy status to other antibiotic agents status: Secondary | ICD-10-CM | POA: Diagnosis not present

## 2017-03-01 DIAGNOSIS — E876 Hypokalemia: Secondary | ICD-10-CM | POA: Diagnosis present

## 2017-03-01 DIAGNOSIS — T148XXA Other injury of unspecified body region, initial encounter: Secondary | ICD-10-CM

## 2017-03-01 LAB — BASIC METABOLIC PANEL
ANION GAP: 5 (ref 5–15)
BUN: 14 mg/dL (ref 6–20)
CALCIUM: 8.4 mg/dL — AB (ref 8.9–10.3)
CO2: 25 mmol/L (ref 22–32)
Chloride: 109 mmol/L (ref 101–111)
Creatinine, Ser: 0.47 mg/dL — ABNORMAL LOW (ref 0.61–1.24)
GLUCOSE: 97 mg/dL (ref 65–99)
POTASSIUM: 3.6 mmol/L (ref 3.5–5.1)
Sodium: 139 mmol/L (ref 135–145)

## 2017-03-01 LAB — URINALYSIS, ROUTINE W REFLEX MICROSCOPIC
Bilirubin Urine: NEGATIVE
Glucose, UA: NEGATIVE mg/dL
Ketones, ur: NEGATIVE mg/dL
Nitrite: NEGATIVE
PROTEIN: 30 mg/dL — AB
SQUAMOUS EPITHELIAL / LPF: NONE SEEN
Specific Gravity, Urine: 1.013 (ref 1.005–1.030)
pH: 6 (ref 5.0–8.0)

## 2017-03-01 LAB — CBC
HEMATOCRIT: 26.9 % — AB (ref 39.0–52.0)
Hemoglobin: 8.3 g/dL — ABNORMAL LOW (ref 13.0–17.0)
MCH: 24.9 pg — ABNORMAL LOW (ref 26.0–34.0)
MCHC: 30.9 g/dL (ref 30.0–36.0)
MCV: 80.8 fL (ref 78.0–100.0)
Platelets: 264 10*3/uL (ref 150–400)
RBC: 3.33 MIL/uL — AB (ref 4.22–5.81)
RDW: 15.6 % — AB (ref 11.5–15.5)
WBC: 8.6 10*3/uL (ref 4.0–10.5)

## 2017-03-01 LAB — URINE CULTURE

## 2017-03-01 LAB — CK: Total CK: 69 U/L (ref 49–397)

## 2017-03-01 MED ORDER — SODIUM CHLORIDE 0.9% FLUSH
10.0000 mL | INTRAVENOUS | Status: DC | PRN
Start: 2017-03-01 — End: 2017-03-03

## 2017-03-01 MED ORDER — SODIUM CHLORIDE 0.9% FLUSH: 10.0000 mL | Freq: Two times a day (BID) | INTRAVENOUS | Status: DC

## 2017-03-01 NOTE — Consult Note (Signed)
[place] Nurse wound consult note Reason for Consult:multiple ulcers, stage IV, healing previous stage IV, partial thickness,  Wound type:pressure Pressure Injury POA: Yes Measurement:Sacrum, buttocks, right ischial tuberosity 20cm x 24cm 100% pink, moderate amt yellow exudate, no odor noted Right ischial at lateral side of large wound has a 3cm x 3cm x 6cm deep area that is hard to see down into but appears 70% pink, 30% yellow Left ischial tuberosity 7cm x 14cm 60% red, 30% yellow, 10% black around 1cm round area where there is protruding bone. Right lateral rib area distal to scapula 5cm x 4cm 100% pink,and a 6cm x 2.5cm adjacent wound that is 50% dark red, 40% pink, 10% yellow exudate with mod yellow exudate present on old dressing and on linens. Right lateral calf 1cm x 1cm x 0.1 partial thickness 100% pink Right heel has healing previous stage IV wound 8cm x 5cm with 80% pink tissue, 20% yellow slough no drainage Right anterior foot 3cm round x 1cm deep wound 80% red, 20% yellow, small amt exudate Left posterior heel 3.5cm x 2.5cm no depth, scar tissue above skin level, 100% pink, 50% of that is hard shell Left medial calf 2cm x 1.5cm x 0.5cm 100% pink, no drainage. Plantar surface of left first toe 2cm x 4cm DTI Right hand the 2nd 3rd, and 4th fingers have clear serous filled blister, the one on the third finger has ruptured. Wound bed:see above Drainage (amount, consistency, odor) see above Periwound: MASD on scrotum and penis, groin Dressing procedure/placement/frequency: I have provided nurses with orders for To large sacral, buttock area, and right rib area, cleanse with NS, pat dry, place Aquacel Ag+ dressings over area, packing one piece into Right ischial deep wound, cover with gauze, ABD for moisture, adhere with paper tape, perform daily. To heel wounds continue foam dressings and add Prevalon Boots. To open wound on top of right foot, cleanse with NS, pat dry, pack with NS moistened  2x2, change daily, may peel back foam to change. To bilateral calf wounds, cleanse with NS, cover with NS moistened gauze, dry gauze, adhere. Perform all dressings daily and prn soiling. Purple top Criticaid Lotion ordered for perineal MASD. A mattress replacement with low air loss feature has been ordered. Company bringing more beds this afternoon. If patients nutritional status requires supplements or nutritional consult please order. (Pt has not had a CMET this admission. We will not follow, but will remain available to this patient, to nursing, and the medical and/or surgical teams.  Please re-consult if we need to assist further.   [place] Nurse ostomy consultnote Stoma type/location: LLQ colostomy, seal is intact Output: small amt brown stool, soft Ostomy pouching: 2pc. 2 and 3/4 inch pouching system. Extra supplies left at the bedside for staff nurse use. Will not follow at this time.  Please re-consult if needed.   Fara Olden, RN-C, WTA-C, [place] Wound Treatment Associate Ostomy Care Associate

## 2017-03-01 NOTE — Progress Notes (Signed)
PROGRESS NOTE  JMICHAEL [name]  DPO:[id number] DOB: [birth year] DOA: 02/28/2017 PCP: [name], MD   Brief Narrative: [name] [name] is a 51 y.o. 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    His symptoms are yesterday around 2:00. He noted that he felt hot. He also had chills as well. Temperature was around 100F  at home. He felt nauseous with breakfast this morning. He decided to come to the emergency department because things often get bad quickly for him. He notes odor from his urine.  His sister notes odor from the decubitus ulcer more recently.   Broad spectrum antibiotics were given for possible UTI vs. infected sacral decubitus ulcer.  Assessment & Plan: Active Problems:   S/P colostomy ([name]   Suprapubic catheter ([name]   SIRS (systemic inflammatory response syndrome) ([name]   Sacral decubitus ulcer  SIRS: Due to UTI vs. infected chronic decubitus ulcerations. Chronic RLE wounds uninfected at this time.  - Monitor blood cultures for at least 48 hours. Leukocytosis resolved.  - Continue Daptomycin and zosyn per ID recommendations.  - Plastics, Dr. Iran Planas to evaluate patient.  - ESR and CRP chronically elevated.   - WOC consulted. Will follow up as usual in wound care clinic every 2 weeks.   Chronic supapubic catheter: Has been in place >1 month. With pyuria on UA.  - Treating for UTI with SIRS + pyuria, bacteriuria - Exchange catheter 10/17, follow up with Dr. Risa Grill  Anemia of chronic kidney disease: No active bleeding noted - Monitor CBC  Quadriplegia: Chronic, stable.Cared for by sister at home. - Continue home baclofen, toviaz  Severe protein calorie malnutrition in the setting of ongoing chronic illness: - Nutritionist consulted  OSA: Chronic, stable - CPAP   Abnormal CXR: Chronic  volume loss in RUL. Also notable for expansile R fifth rib lesion concerning for enchondroma or low grade chondrosarcoma.  - Recommend considering contrasted chest CT to further assess.  Will hold off at this time as without notable respiratory symptoms.    DVT prophylaxis: Lovenox Code Status: Full Family Communication: None at bedside this AM Disposition Plan: [place] home if cultures remain negative.   Consultants:   Plastics  Procedures:   None  Antimicrobials:  Zosyn 10/16 >>   Daptomycin 10/16 >>    Subjective: No fevers since arrival. No significant pain. Appetite returning.   Objective: Vitals:   03/01/17 0003 03/01/17 0431 03/01/17 0635 03/01/17 1319  BP:  (!) 84/55 101/69 (!) 101/52  Pulse:  89  79  Resp: 18 16  18   Temp:  98.1 F (36.7 C)  97.8 F (36.6 C)  TempSrc:  Oral  Axillary  SpO2:  100%  100%  Weight:      Height:        Intake/Output Summary (Last 24 hours) at 03/01/17 1516 Last data filed at 03/01/17 1200  Gross per 24 hour  Intake              688 ml  Output             2300 ml  Net            -1612 ml   Filed Weights   02/28/17 1124  Weight: 91.6 kg (202 lb)    Gen: 51 y.o. male in no distress  Pulm: Non-labored breathing room air. Clear to auscultation  bilaterally.  CV: Regular rate and rhythm. No murmur, rub, or gallop. No JVD, no pedal edema. GI: Abdomen soft, non-tender, non-distended, with normoactive bowel sounds. No organomegaly or masses felt. Suprapubic catheter without frank purulence. Ext: Warm, no deformities Skin: Examined with wound care RN.  Sacrum, buttocks, right ischial tuberosity 20cm x 24cm 100% pink, moderate amt yellow exudate, no odor noted Right ischial at lateral side of large wound has a 3cm x 3cm x 6cm deep area that is hard to see down into but appears 70% pink, 30% yellow Left ischial tuberosity 7cm x 14cm 60% red, 30% yellow, 10% black around 1cm round area where there is protruding bone. Right  lateral rib area distal to scapula 5cm x 4cm 100% pink,and a 6cm x 2.5cm adjacent wound that is 50% dark red, 40% pink, 10% yellow exudate with mod yellow exudate present on old dressing and on linens. Right lateral calf 1cm x 1cm x 0.1 partial thickness 100% pink Right heel has healing previous stage IV wound 8cm x 5cm with 80% pink tissue, 20% yellow slough no drainage Right anterior foot 3cm round x 1cm deep wound 80% red, 20% yellow, small amt exudate Left posterior heel 3.5cm x 2.5cm no depth, scar tissue above skin level, 100% pink, 50% of that is hard shell Left medial calf 2cm x 1.5cm x 0.5cm 100% pink, no drainage. Plantar surface of left first toe 2cm x 4cm DTI Right hand the 2nd 3rd, and 4th fingers have clear serous filled blister, the one on the third finger has ruptured. Unchanged from images found in ED provider note 10/16.  Neuro: Alert and oriented. Spastic quadriplegia with limited use of LUE.  Psych: Judgement and insight appear normal. Mood & affect appropriate.   Data Reviewed: I have personally reviewed following labs and imaging studies  CBC:  Recent Labs Lab 02/28/17 1404 03/01/17 0751  WBC 12.9* 8.6  NEUTROABS 9.5*  --   HGB 8.9* 8.3*  HCT 27.8* 26.9*  MCV 80.1 80.8  PLT 320 130   Basic Metabolic Panel:  Recent Labs Lab 02/28/17 1404 03/01/17 0751  NA 140 139  K 3.3* 3.6  CL 106 109  CO2 24 25  GLUCOSE 122* 97  BUN 20 14  CREATININE 0.63 0.47*  CALCIUM 8.3* 8.4*   GFR: Estimated Creatinine Clearance: 121.3 mL/min (A) (by C-G formula based on SCr of 0.47 mg/dL (L)).  Cardiac Enzymes:  Recent Labs Lab 03/01/17 0751  CKTOTAL 69   Urine analysis:    Component Value Date/Time   COLORURINE YELLOW 02/28/2017 1346   APPEARANCEUR TURBID (A) 02/28/2017 1346   LABSPEC 1.013 02/28/2017 1346   PHURINE 6.0 02/28/2017 1346   GLUCOSEU NEGATIVE 02/28/2017 1346   HGBUR SMALL (A) 02/28/2017 1346   BILIRUBINUR NEGATIVE 02/28/2017 1346   KETONESUR  NEGATIVE 02/28/2017 1346   PROTEINUR 30 (A) 02/28/2017 1346   UROBILINOGEN 1.0 03/16/2015 0835   NITRITE NEGATIVE 02/28/2017 1346   LEUKOCYTESUR LARGE (A) 02/28/2017 1346   Recent Results (from the past 240 hour(s))  Wound or Superficial Culture     Status: None (Preliminary result)   Collection Time: 02/28/17  1:37 PM  Result Value Ref Range Status   Specimen Description BUTTOCKS LEFT  Final   Special Requests NONE  Final   Gram Stain   Final    RARE WBC PRESENT, PREDOMINANTLY PMN ABUNDANT GRAM NEGATIVE RODS ABUNDANT GRAM POSITIVE COCCI FEW GRAM POSITIVE RODS    Culture   Final    CULTURE  REINCUBATED FOR BETTER GROWTH Performed at [facility], [place] [address] [place], [place] [address]    Report Status PENDING  Incomplete  Wound or Superficial Culture     Status: None (Preliminary result)   Collection Time: 02/28/17  1:37 PM  Result Value Ref Range Status   Specimen Description BUTTOCKS RIGHT  Final   Special Requests NONE  Final   Gram Stain NO WBC SEEN RARE GRAM POSITIVE COCCI   Final   Culture   Final    CULTURE REINCUBATED FOR BETTER GROWTH Performed at [facility], [address], [place], [place] [address]    Report Status PENDING  Incomplete  Urine culture     Status: Abnormal   Collection Time: 02/28/17  1:46 PM  Result Value Ref Range Status   Specimen Description URINE, SUPRAPUBIC  Final   Special Requests NONE  Final   Culture MULTIPLE SPECIES PRESENT, SUGGEST RECOLLECTION (A)  Final   Report Status 03/01/2017 FINAL  Final  Culture, blood (routine x 2)     Status: None (Preliminary result)   Collection Time: 02/28/17  2:04 PM  Result Value Ref Range Status   Specimen Description BLOOD PORTA CATH  Final   Special Requests   Final    BOTTLES DRAWN AEROBIC AND ANAEROBIC Blood Culture adequate volume   Culture   Final    NO GROWTH < 24 HOURS Performed at [facility], [place] [address], [place], [place] [address]    Report Status PENDING   Incomplete  Culture, blood (routine x 2)     Status: None (Preliminary result)   Collection Time: 02/28/17  8:52 PM  Result Value Ref Range Status   Specimen Description BLOOD RIGHT HAND  Final   Special Requests   Final    BOTTLES DRAWN AEROBIC ONLY Blood Culture adequate volume   Culture   Final    NO GROWTH < 24 HOURS Performed at [facility], [place] [address] [place], [place] [address]    Report Status PENDING  Incomplete      Radiology Studies: Dg Chest Port 1 View  Result Date: 02/28/2017 CLINICAL DATA:  Fever EXAM: PORTABLE CHEST 1 VIEW COMPARISON:  November 27, 2016 FINDINGS: Port-A-Cath tip is in superior vena cava. No pneumothorax. There is chronic right upper lobe volume loss. Lungs elsewhere clear. Heart is mildly enlarged with pulmonary vascularity within normal limits. No adenopathy. Old healed rib fractures are noted on the left. There is an expansile lesion involving the right fifth rib with chondroid matrix. There is postoperative change in the lower cervical spine. IMPRESSION: 1. Chronic volume loss right upper lobe. Etiology uncertain. This finding may warrant contrast enhanced chest CT to further assess. 2.  Lungs elsewhere clear. 3.  Stable cardiac prominence. 4. Expansile right fifth rib lesion with chondroid matrix, stable. Main differential considerations for a lesion of this nature include enchondroma or low grade chondrosarcoma. 5.  Port-A-Cath tip in superior vena cava.  No evident pneumothorax. Electronically Signed   By: Lowella Grip III M.D.   On: 02/28/2017 19:25    Scheduled Meds: . baclofen  20 mg Oral QID  . enoxaparin (LOVENOX) injection  40 mg Subcutaneous Q24H  . ferrous sulfate  325 mg Oral TID WC  . fesoterodine  8 mg Oral Daily  . magnesium oxide  400 mg Oral Daily  . metoCLOPramide  10 mg Oral Q6H  . multivitamin with minerals  1 tablet Oral q morning -  10a  . pantoprazole  40 mg Oral BID AC  . potassium chloride SA  20 mEq Oral BID  .  sodium chloride flush  10-40 mL Intracatheter Q12H  . sucralfate  1 g Oral TID AC & HS  . vitamin C  500 mg Oral q morning - 10a  . zinc sulfate  220 mg Oral BID   Continuous Infusions: . DAPTOmycin (CUBICIN)  IV Stopped (02/28/17 2245)  . piperacillin-tazobactam (ZOSYN)  IV Stopped (03/01/17 1400)     LOS: 0 days   Time spent: 25 minutes.  Vance Gather, MD [facility] Pager [phone]  If 7PM-7AM, please contact night-coverage [email or web address] Password [email or web address] 03/01/2017, 3:16 PM
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
## 2017-03-02 DIAGNOSIS — N390 Urinary tract infection, site not specified: Secondary | ICD-10-CM

## 2017-03-02 DIAGNOSIS — T83510A Infection and inflammatory reaction due to cystostomy catheter, initial encounter: Secondary | ICD-10-CM

## 2017-03-02 MED ORDER — HEPARIN SOD (PORK) LOCK FLUSH 100 UNIT/ML IV SOLN
500.0000 [IU] | INTRAVENOUS | Status: AC | PRN
  Administered 2017-03-02: 500 [IU]

## 2017-03-02 MED ORDER — CEPHALEXIN 500 MG PO CAPS: 500.0000 mg | ORAL_CAPSULE | Freq: Three times a day (TID) | ORAL | 0 refills | Status: DC

## 2017-03-02 MED ORDER — BOOST PLUS PO LIQD
237.0000 mL | Freq: Two times a day (BID) | ORAL | Status: DC
  Administered 2017-03-02: 237 mL via ORAL
  Filled 2017-03-02: qty 237

## 2017-03-02 MED ORDER — CEPHALEXIN 500 MG PO CAPS
500.0000 mg | ORAL_CAPSULE | Freq: Three times a day (TID) | ORAL | Status: DC
  Administered 2017-03-02: 500 mg via ORAL
  Filled 2017-03-02: qty 1

## 2017-03-02 MED ORDER — JUVEN PO PACK
1.0000 | PACK | Freq: Two times a day (BID) | ORAL | Status: DC
  Administered 2017-03-02: 1 via ORAL
  Filled 2017-03-02: qty 1

## 2017-03-02 MED ORDER — SODIUM CHLORIDE 0.9 % IV SOLN
500.0000 mg | Freq: Every day | INTRAVENOUS | Status: DC
  Filled 2017-03-02: qty 10

## 2017-03-02 NOTE — Discharge Summary (Signed)
Physician Discharge Summary  KYLAN LIBERATI YNW:[id number] DOB: 12/08/1965 DOA: 02/28/2017  PCP: [name], MD  Admit date: 02/28/2017 Discharge date: 03/02/2017  Admitted From: Home Disposition: Home   Recommendations for Outpatient Follow-up:  1. Follow up with PCP in 1-2 weeks 2. Follow up with urology, Dr. Risa Grill  3. Follow up as usual with wound care center.   Home Health: None new Equipment/Devices: None new Discharge Condition: Stable CODE STATUS: Full Diet recommendation: As tolerated.   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   His symptoms started yesterday around 2:00. He noted that he felt hot. He also had chills as well. Temperature was around 100F at home. He felt nauseous with breakfast this morning. He decided to come to the emergency department because things often get bad quickly for him. He notes odor from his urine. His sister notes odor from the decubitus ulcer more recently.   Broad spectrum antibiotics were given for possible UTI vs. infected sacral decubitus ulcer.  Discharge Diagnoses:  Active Problems:   S/P colostomy ([name]   Suprapubic catheter ([name]   SIRS (systemic inflammatory response syndrome) ([name]   Sacral decubitus ulcer  SIRS: Due to UTI vs. infected chronic decubitus ulcerations. Chronic RLE wounds uninfected at this time.  - Blood cultures negative at 48 hours. Leukocytosis resolved. Will continue antibiotics (change dapto/zosyn to keflex) - Plastics, [name], evaluated, recommended continued local wound care. No indication for procedure or purulent infection.  - WOC consulted. Will follow up as usual in wound care clinic every 2 weeks.   CA-UTI:  - Treating for UTI with SIRS + pyuria, bacteriuria.  Urine culture nonclonal. Will continue with keflex for prolonged duration.  - Exchanged catheter 10/17 (had been in place >1 month) - Follow up with Dr. Risa Grill  History of AKI with vancomycin: Avoiding vancomycin, gave daptomycin which was tolerated well.   Anemia of chronic kidney disease: No active bleeding noted - Monitor CBC  Quadriplegia: Chronic, stable.Cared for by sister at home. - Continue home baclofen, toviaz  Severe protein calorie malnutrition in the setting of ongoing chronic illness: - Nutritionist consulted  OSA: Chronic, stable - CPAP   Abnormal CXR: Chronic volume loss in RUL.  - Recommend considering contrasted chest CT to further assess. Will hold off at this time as without notable respiratory symptoms.   Discharge Instructions Discharge Instructions    Discharge instructions    Complete by:  As directed    You were admitted with potential sepsis, an infection that is thought to be due to a urinary tract infection. The wounds were evaluated and not found to have any specific site of acute infection. You will need to continue following up with the wound care center.  - Take keflex 500mg  three times daily for 10 more days (longer duration because of the catheter).  - If your symptoms return, seek medical attention right away.  - Otherwise, follow up with your PCP in the next couple weeks for hospital follow up.     Allergies as of 03/02/2017      Reactions   Feraheme [ferumoxytol] Other (See Comments)   SYNCOPE   Ditropan [oxybutynin] Other (See Comments)   Hallucinations    Vancomycin Other (See Comments)   ARF 05-2016      Medication List    TAKE these medications  baclofen 20 MG tablet Commonly known as:  LIORESAL Take 20 mg by mouth 4 (four) times daily.   cephALEXin 500 MG capsule Commonly known as:  KEFLEX Take 1 capsule (500 mg total) by mouth every 8 (eight) hours.   ferrous sulfate 325 (65 FE) MG EC tablet Take 1 tablet (325 mg  total) by mouth 3 (three) times daily with meals.   furosemide 20 MG tablet Commonly known as:  LASIX Take 1 tablet (20 mg total) by mouth 2 (two) times daily as needed for fluid or edema. Take with Klor-Con   magnesium oxide 400 (241.3 Mg) MG tablet Commonly known as:  MAG-OX Take 1 tablet (400 mg total) by mouth daily.   metoCLOPramide 10 MG tablet Commonly known as:  REGLAN Take 1 tablet (10 mg total) by mouth every 6 (six) hours.   multivitamin with minerals Tabs tablet Take 1 tablet by mouth every morning.   pantoprazole 40 MG tablet Commonly known as:  PROTONIX Take 2 tablets (80 mg total) by mouth 2 (two) times daily before a meal. What changed:  how much to take   potassium chloride SA 20 MEQ tablet Commonly known as:  K-DUR,KLOR-CON Take 1 tablet (20 mEq total) by mouth 2 (two) times daily.   sucralfate 1 g tablet Commonly known as:  CARAFATE Take 1 tablet (1 g total) by mouth 4 (four) times daily.   TOVIAZ 8 MG Tb24 tablet Generic drug:  fesoterodine Take 8 mg by mouth daily.   vitamin C 500 MG tablet Commonly known as:  ASCORBIC ACID Take 500 mg by mouth every morning.   Zinc 50 MG Tabs Take 50 mg by mouth 2 (two) times daily.      Follow-up Information    [name], MD. Schedule an appointment as soon as possible for a visit in 1 week(s).   Specialty:  Internal Medicine Contact information: [address] [address] [place]              Follow up.   Contact information: [address] [phone]         Allergies  Allergen Reactions  . Feraheme [Ferumoxytol] Other (See Comments)    SYNCOPE  . Ditropan [Oxybutynin] Other (See Comments)    Hallucinations   . Vancomycin Other (See Comments)    ARF 05-2016    Consultations:  ID by phone  Plastic surgery, [name]  Procedures/Studies: Ct  Abdomen Pelvis W Contrast  Result Date: 02/05/2017 CLINICAL DATA:  Vomiting and black tarry stool since Friday. Quadriplegic. EXAM: CT ABDOMEN AND PELVIS WITH CONTRAST TECHNIQUE: Multidetector CT imaging of the abdomen and pelvis was performed using the standard protocol following bolus administration of intravenous contrast. CONTRAST:  <See Chart> ISOVUE-300 IOPAMIDOL (ISOVUE-300) INJECTION 61% COMPARISON:  08/27/2016 FINDINGS: Lower chest: Lung bases demonstrate mild bibasilar atelectasis/scarring. Subtle right base opacification which may be due to atelectasis although cannot exclude early infection. Tiny amount of right pleural fluid fluid within the distal esophagus likely due to reflux. Hepatobiliary: Within normal. Pancreas: Within normal. Spleen: Within normal. Adrenals/Urinary Tract: Adrenal glands are normal. Kidneys normal in size without hydronephrosis or nephrolithiasis. 1 cm hypodensity over the mid pole cortex of the left kidney unchanged likely a cyst. Ureters are normal. Suprapubic Foley catheter is present within a decompressed bladder. Stomach/Bowel: Stable gastric distension. Small bowel is within normal. Appendix is  normal. Left lower quadrant ostomy site within normal and unchanged. Stool within the distal colon unchanged. Vascular/Lymphatic: Within normal. Reproductive: Within normal. Other: No free fluid, free air or focal inflammatory change. Musculoskeletal: Severe chronic changes of the pelvis and hips without significant change. Evidence of patient's right-sided soft tissue ulceration posterior to the right hip without significant change. Cannot exclude soft tissue infection or associated adjacent osteomyelitis. IMPRESSION: No acute findings in the abdomen/pelvis. Bibasilar scarring/ atelectasis with tiny amount of right pleural fluid. Subtle right base opacification as could not completely exclude an early infectious process in the right base. Stable left renal cyst. Ostomy site over  the left lower quadrant unchanged. Suprapubic Foley catheter within a decompressed bladder. Fluid within the distal esophagus likely due to reflux. Mild stable gastric distension. Severe chronic changes over the pelvic bones and hips with stable soft tissue ulceration over the posterior aspect of the right hip. Cannot exclude soft tissue infection or bone infection. Electronically Signed   By: Marin Olp M.D.   On: 02/05/2017 14:44   Dg Chest Port 1 View  Result Date: 02/28/2017 CLINICAL DATA:  Fever EXAM: PORTABLE CHEST 1 VIEW COMPARISON:  November 27, 2016 FINDINGS: Port-A-Cath tip is in superior vena cava. No pneumothorax. There is chronic right upper lobe volume loss. Lungs elsewhere clear. Heart is mildly enlarged with pulmonary vascularity within normal limits. No adenopathy. Old healed rib fractures are noted on the left. There is an expansile lesion involving the right fifth rib with chondroid matrix. There is postoperative change in the lower cervical spine. IMPRESSION: 1. Chronic volume loss right upper lobe. Etiology uncertain. This finding may warrant contrast enhanced chest CT to further assess. 2.  Lungs elsewhere clear. 3.  Stable cardiac prominence. 4. Expansile right fifth rib lesion with chondroid matrix, stable. Main differential considerations for a lesion of this nature include enchondroma or low grade chondrosarcoma. 5.  Port-A-Cath tip in superior vena cava.  No evident pneumothorax. Electronically Signed   By: Lowella Grip III M.D.   On: 02/28/2017 19:25   Subjective: No fevers, cough, dyspnea, chest pain, abd pain, N/V/D, or other complaints.   Discharge Exam: Vitals:   03/01/17 2132 03/02/17 0538  BP: 136/88 139/80  Pulse: 87 77  Resp: 18   Temp: 98.7 F (37.1 C) 98 F (36.7 C)  SpO2: 98% 100%  Gen: 51 y.o. male in no distress  Pulm: Non-labored breathing room air. Clear to auscultation bilaterally.  CV: Regular rate and rhythm. No murmur, rub, or gallop. No JVD,  no pedal edema. GI: Abdomen soft, non-tender, non-distended, with normoactive bowel sounds. No organomegaly or masses felt. Suprapubic catheter without frank purulence. Ext: Warm, no deformities Skin: Unchanged from prior.  Sacrum, buttocks, right ischial tuberosity 20cm x 24cm 100% pink, moderate amt yellow exudate, no odor noted Right ischial at lateral side of large wound has a 3cm x 3cm x 6cm deep area that is hard to see down into but appears 70% pink, 30% yellow Left ischial tuberosity 7cm x 14cm 60% red, 30% yellow, 10% black around 1cm round area where there is protruding bone. Right lateral rib area distal to scapula 5cm x 4cm 100% pink,and a 6cm x 2.5cm adjacent wound that is 50% dark red, 40% pink, 10% yellow exudate with mod yellow exudate present on old dressing and on linens. Right lateral calf 1cm x 1cm x 0.1 partial thickness 100% pink Right heel has healing previous stage IV wound 8cm x 5cm with 80% pink tissue,  20% yellow slough no drainage Right anterior foot 3cm round x 1cm deep wound 80% red, 20% yellow, small amt exudate Left posterior heel 3.5cm x 2.5cm no depth, scar tissue above skin level, 100% pink, 50% of that is hard shell Left medial calf 2cm x 1.5cm x 0.5cm 100% pink, no drainage. Plantar surface of left first toe 2cm x 4cm DTI Right hand the 2nd 3rd, and 4th fingers have clear serous filled blister, the one on the third finger has ruptured. Unchanged from images found in ED provider note 10/16.  Neuro: Alert and oriented. Spastic quadriplegia with limited use of LUE.  Psych: Judgement and insight appear normal. Mood & affect appropriate.   Labs: BNP (last 3 results) No results for input(s): BNP in the last 8760 hours. Basic Metabolic Panel:  Recent Labs Lab 02/28/17 1404 03/01/17 0751  NA 140 139  K 3.3* 3.6  CL 106 109  CO2 24 25  GLUCOSE 122* 97  BUN 20 14  CREATININE 0.63 0.47*  CALCIUM 8.3* 8.4*   Liver Function Tests: No results for input(s):  AST, ALT, ALKPHOS, BILITOT, PROT, ALBUMIN in the last 168 hours. No results for input(s): LIPASE, AMYLASE in the last 168 hours. No results for input(s): AMMONIA in the last 168 hours. CBC:  Recent Labs Lab 02/28/17 1404 03/01/17 0751  WBC 12.9* 8.6  NEUTROABS 9.5*  --   HGB 8.9* 8.3*  HCT 27.8* 26.9*  MCV 80.1 80.8  PLT 320 264   Cardiac Enzymes:  Recent Labs Lab 03/01/17 0751  CKTOTAL 69   BNP: Invalid input(s): POCBNP CBG: No results for input(s): GLUCAP in the last 168 hours. D-Dimer No results for input(s): DDIMER in the last 72 hours. Hgb A1c No results for input(s): HGBA1C in the last 72 hours. Lipid Profile No results for input(s): CHOL, HDL, LDLCALC, TRIG, CHOLHDL, LDLDIRECT in the last 72 hours. Thyroid function studies No results for input(s): TSH, T4TOTAL, T3FREE, THYROIDAB in the last 72 hours.  Invalid input(s): FREET3 Anemia work up No results for input(s): VITAMINB12, FOLATE, FERRITIN, TIBC, IRON, RETICCTPCT in the last 72 hours. Urinalysis    Component Value Date/Time   COLORURINE YELLOW 02/28/2017 1346   APPEARANCEUR TURBID (A) 02/28/2017 1346   LABSPEC 1.013 02/28/2017 1346   PHURINE 6.0 02/28/2017 1346   GLUCOSEU NEGATIVE 02/28/2017 1346   HGBUR SMALL (A) 02/28/2017 1346   BILIRUBINUR NEGATIVE 02/28/2017 1346   KETONESUR NEGATIVE 02/28/2017 1346   PROTEINUR 30 (A) 02/28/2017 1346   UROBILINOGEN 1.0 03/16/2015 0835   NITRITE NEGATIVE 02/28/2017 1346   LEUKOCYTESUR LARGE (A) 02/28/2017 1346    Microbiology Recent Results (from the past 240 hour(s))  Wound or Superficial Culture     Status: None (Preliminary result)   Collection Time: 02/28/17  1:37 PM  Result Value Ref Range Status   Specimen Description BUTTOCKS LEFT  Final   Special Requests NONE  Final   Gram Stain   Final    RARE WBC PRESENT, PREDOMINANTLY PMN ABUNDANT GRAM NEGATIVE RODS ABUNDANT GRAM POSITIVE COCCI FEW GRAM POSITIVE RODS    Culture   Final    CULTURE  REINCUBATED FOR BETTER GROWTH Performed at [facility], [place] [address], [place], [place] [address]    Report Status PENDING  Incomplete  Wound or Superficial Culture     Status: None (Preliminary result)   Collection Time: 02/28/17  1:37 PM  Result Value Ref Range Status   Specimen Description BUTTOCKS RIGHT  Final   Special  Requests NONE  Final   Gram Stain NO WBC SEEN RARE GRAM POSITIVE COCCI   Final   Culture   Final    CULTURE REINCUBATED FOR BETTER GROWTH Performed at [facility], [place] [address], [place], [place] [address]    Report Status PENDING  Incomplete  Urine culture     Status: Abnormal   Collection Time: 02/28/17  1:46 PM  Result Value Ref Range Status   Specimen Description URINE, SUPRAPUBIC  Final   Special Requests NONE  Final   Culture MULTIPLE SPECIES PRESENT, SUGGEST RECOLLECTION (A)  Final   Report Status 03/01/2017 FINAL  Final  Culture, blood (routine x 2)     Status: None (Preliminary result)   Collection Time: 02/28/17  2:04 PM  Result Value Ref Range Status   Specimen Description BLOOD PORTA CATH  Final   Special Requests   Final    BOTTLES DRAWN AEROBIC AND ANAEROBIC Blood Culture adequate volume   Culture   Final    NO GROWTH < 24 HOURS Performed at [facility], [place] [address] [place], [place] [address]    Report Status PENDING  Incomplete  Culture, blood (routine x 2)     Status: None (Preliminary result)   Collection Time: 02/28/17  8:52 PM  Result Value Ref Range Status   Specimen Description BLOOD RIGHT HAND  Final   Special Requests   Final    BOTTLES DRAWN AEROBIC ONLY Blood Culture adequate volume   Culture   Final    NO GROWTH < 24 HOURS Performed at [facility], [place] [address] [place], [place] [address]    Report Status PENDING  Incomplete    Time coordinating discharge: Approximately 40 minutes  Vance Gather, MD  [facility] 03/02/2017, 2:46 PM Pager [phone]

## 2017-03-02 NOTE — Progress Notes (Signed)
Initial Nutrition Assessment  DOCUMENTATION CODES:   Not applicable  INTERVENTION:  - Will order Boost Plus TID, each supplement provides 360 kcal and 14 grams of protein. - Will order Juven BID, each supplement provides 80 kcal and 14 grams of amino acids.  - Continue to encourage PO intakes, with a focus on protein.   NUTRITION DIAGNOSIS:   Increased nutrient needs (for protein) related to wound healing as evidenced by estimated needs.  GOAL:   Patient will meet greater than or equal to 90% of their needs  MONITOR:   PO intake, Supplement acceptance, Weight trends, Labs, Skin  REASON FOR ASSESSMENT:   Low Braden, Consult Assessment of nutrition requirement/status  ASSESSMENT:   51 y.o. 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    Pt seen for consult and low Braden. BMI indicates over weight status. IBW based on quadriplegia. Per chart review, pt consumed 100% of breakfast (990 kcal, 38 grams of protein) and 100% of dinner (1455 kcal, 39 grams of protein) yesterday and 100% of breakfast (1125 kcal, 39 grams of protein) this AM. Pt is well-known to this RD from previous admissions. He always has a very good appetite. Pt focuses on protein at home and also drinks Boost or similar protein drinks at home.   Physical assessment shows no muscle and no fat wasting; moderate edema to BLE. Per chart review, weight has been stable since June.  Medications reviewed; 325 mg ferrous sulfate TID, 400 mg oral Mag-ox/day, 10 mg oral Reglan QID, daily multivitamin with minerals, 40 mg oral Protonix BID, 20 mEq oral KCl BID, 1 g Carafate TID, 500 mg ascorbic acid/day, 220 mg zinc sulfate BID.  Labs reviewed; creatinine: 0.47 mg/dL, Ca: 8.4 mg/dL.    Diet Order:  Diet regular Room service appropriate? Yes; Fluid consistency:  Thin  Skin:   Stage 4 sacral and Stage 2 R leg pressure injuries; tunneling to R IT; eschar present on bilateral heels, non-pressure injury to L foot.   Last BM:  10/16  Height:   Ht Readings from Last 1 Encounters:  02/28/17 6' (1.829 m)    Weight:   Wt Readings from Last 1 Encounters:  02/28/17 202 lb (91.6 kg)    Ideal Body Weight:  72.82 kg  BMI:  Body mass index is 27.4 kg/m.  Estimated Nutritional Needs:   Kcal:  1027-2536 (27-29 kcal/kg)  Protein:  130-140 grams  Fluid:  >/= 2.2 L/day  EDUCATION NEEDS:   No education needs identified at this time    [name] Matin, MS, RD, LDN, CNSC Inpatient Clinical Dietitian Pager # [phone] After hours/weekend pager # [phone]

## 2017-03-02 NOTE — Progress Notes (Signed)
Could not find a home health agency to assist pt with any home care. [facility], [facility], [facility] all said no to [place] for pt.  Explained to pt and asked if he would go to SNF. Pt states no he will go home.

## 2017-03-02 NOTE — Progress Notes (Signed)
Pharmacy Antibiotic Note  [name] [name] is a 51 y.o. 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 Patient has been seen multiple times in past few years for same, including bone involvement. Noted with acute renal injury from vancomycin in January 2018; admitting MD discussed with ID and will treat with Daptomycin and Zosyn per pharmacy.  Plan:  Increase Daptomycin to 500 mg (~6 mg/kg) IV q24 hr since chronic osteomyelitis  Continue Zosyn 3.375 g IV every 8 hrs by 4-hr infusion  CK at baseline and weekly while on daptomycin (69 on 10/17)  Narrow as clinically appropriate   Height: 6' (182.9 cm) Weight: 202 lb (91.6 kg) IBW/kg (Calculated) : 77.6  Temp (24hrs), Avg:98.2 F (36.8 C), Min:97.8 F (36.6 C), Max:98.7 F (37.1 C)   Recent Labs Lab 02/28/17 1404 02/28/17 1413 03/01/17 0751  WBC 12.9*  --  8.6  CREATININE 0.63  --  0.47*  LATICACIDVEN  --  0.61  --     Estimated Creatinine Clearance: 121.3 mL/min (A) (by C-G formula based on SCr of 0.47 mg/dL (L)).    Allergies  Allergen Reactions  . Feraheme [Ferumoxytol] Other (See Comments)    SYNCOPE  . Ditropan [Oxybutynin] Other (See Comments)    Hallucinations   . Vancomycin Other (See Comments)    ARF 05-2016    Antimicrobials this admission: Daptomycin 10/16 >>  Zosyn 10/16 >>   Dose adjustments this admission: 10/18 increase from 4mg /kg to 6mg /kg since osteo  Microbiology results: 10/16 BCx: ngtd 10/16 UCx: multiple species  10/16 R buttock wound: rare GPC, culture reincubating 10/16 L buttock wound: abundant GNR, GPC, GPR, culture reincubating  Thank you for allowing pharmacy to be a part of this patient's care.  Peggyann Juba, PharmD, BCPS Pager: [phone] 03/02/2017, 12:58 PM

## 2017-03-02 NOTE — Progress Notes (Signed)
Pt's insurance [employer] Aurelio Jew [phone] called left message for pt go to Rehab or SNF. A return call was made with no answer. Upon talking with pt he states he will go home if no IV ABX is needed. If IV is needed pt has selected [facility] or [facility] for SNF. Pt states he will need HHRN for porta cath care and catheter care. Will check to see which [facility] agency can take him. Pt also states that no [place] agency will do wound care. Pt's sister is his caregiver at home. [facility] called for transportation home after 6PM.

## 2017-03-03 ENCOUNTER — Other Ambulatory Visit: Payer: Medicare Other

## 2017-03-03 ENCOUNTER — Ambulatory Visit: Payer: Medicare Other

## 2017-03-03 DIAGNOSIS — L89159 Pressure ulcer of sacral region, unspecified stage: Secondary | ICD-10-CM | POA: Diagnosis not present

## 2017-03-03 DIAGNOSIS — L89154 Pressure ulcer of sacral region, stage 4: Secondary | ICD-10-CM | POA: Diagnosis not present

## 2017-03-03 DIAGNOSIS — L89119 Pressure ulcer of right upper back, unspecified stage: Secondary | ICD-10-CM | POA: Diagnosis not present

## 2017-03-03 DIAGNOSIS — G8250 Quadriplegia, unspecified: Secondary | ICD-10-CM | POA: Diagnosis not present

## 2017-03-03 LAB — AEROBIC CULTURE W GRAM STAIN (SUPERFICIAL SPECIMEN)

## 2017-03-04 LAB — AEROBIC CULTURE  (SUPERFICIAL SPECIMEN)

## 2017-03-04 LAB — AEROBIC CULTURE W GRAM STAIN (SUPERFICIAL SPECIMEN): Gram Stain: NONE SEEN

## 2017-03-05 LAB — CULTURE, BLOOD (ROUTINE X 2)
CULTURE: NO GROWTH
Culture: NO GROWTH
SPECIAL REQUESTS: ADEQUATE
Special Requests: ADEQUATE

## 2017-03-13 DIAGNOSIS — L89894 Pressure ulcer of other site, stage 4: Secondary | ICD-10-CM | POA: Diagnosis not present

## 2017-03-13 DIAGNOSIS — S81802A Unspecified open wound, left lower leg, initial encounter: Secondary | ICD-10-CM | POA: Diagnosis not present

## 2017-03-13 DIAGNOSIS — S31109A Unspecified open wound of abdominal wall, unspecified quadrant without penetration into peritoneal cavity, initial encounter: Secondary | ICD-10-CM | POA: Diagnosis not present

## 2017-03-13 DIAGNOSIS — S91301A Unspecified open wound, right foot, initial encounter: Secondary | ICD-10-CM | POA: Diagnosis not present

## 2017-03-13 DIAGNOSIS — L89154 Pressure ulcer of sacral region, stage 4: Secondary | ICD-10-CM | POA: Diagnosis not present

## 2017-03-13 DIAGNOSIS — S31104A Unspecified open wound of abdominal wall, left lower quadrant without penetration into peritoneal cavity, initial encounter: Secondary | ICD-10-CM | POA: Diagnosis not present

## 2017-03-13 DIAGNOSIS — L89309 Pressure ulcer of unspecified buttock, unspecified stage: Secondary | ICD-10-CM | POA: Diagnosis not present

## 2017-03-13 DIAGNOSIS — X58XXXA Exposure to other specified factors, initial encounter: Secondary | ICD-10-CM | POA: Diagnosis not present

## 2017-03-13 DIAGNOSIS — L89612 Pressure ulcer of right heel, stage 2: Secondary | ICD-10-CM | POA: Diagnosis not present

## 2017-03-14 ENCOUNTER — Other Ambulatory Visit: Payer: Self-pay | Admitting: Adult Health

## 2017-03-14 DIAGNOSIS — D649 Anemia, unspecified: Secondary | ICD-10-CM

## 2017-03-19 DIAGNOSIS — A419 Sepsis, unspecified organism: Secondary | ICD-10-CM | POA: Diagnosis not present

## 2017-03-19 DIAGNOSIS — L89159 Pressure ulcer of sacral region, unspecified stage: Secondary | ICD-10-CM | POA: Diagnosis not present

## 2017-03-19 DIAGNOSIS — L89119 Pressure ulcer of right upper back, unspecified stage: Secondary | ICD-10-CM | POA: Diagnosis not present

## 2017-03-19 DIAGNOSIS — G8250 Quadriplegia, unspecified: Secondary | ICD-10-CM | POA: Diagnosis not present

## 2017-03-20 ENCOUNTER — Ambulatory Visit: Payer: Medicare Other | Admitting: Infectious Diseases

## 2017-03-21 ENCOUNTER — Other Ambulatory Visit: Payer: Self-pay | Admitting: Adult Health

## 2017-03-21 DIAGNOSIS — D649 Anemia, unspecified: Secondary | ICD-10-CM

## 2017-03-23 ENCOUNTER — Other Ambulatory Visit: Payer: Self-pay | Admitting: Adult Health

## 2017-03-27 DIAGNOSIS — L89613 Pressure ulcer of right heel, stage 3: Secondary | ICD-10-CM | POA: Diagnosis not present

## 2017-03-27 DIAGNOSIS — L89154 Pressure ulcer of sacral region, stage 4: Secondary | ICD-10-CM | POA: Diagnosis not present

## 2017-03-27 DIAGNOSIS — L97513 Non-pressure chronic ulcer of other part of right foot with necrosis of muscle: Secondary | ICD-10-CM | POA: Diagnosis not present

## 2017-03-27 DIAGNOSIS — L89104 Pressure ulcer of unspecified part of back, stage 4: Secondary | ICD-10-CM | POA: Diagnosis not present

## 2017-03-31 ENCOUNTER — Ambulatory Visit: Payer: Medicare Other

## 2017-03-31 ENCOUNTER — Encounter (HOSPITAL_BASED_OUTPATIENT_CLINIC_OR_DEPARTMENT_OTHER): Payer: No Typology Code available for payment source

## 2017-03-31 ENCOUNTER — Other Ambulatory Visit: Payer: Medicare Other

## 2017-03-31 DIAGNOSIS — L89154 Pressure ulcer of sacral region, stage 4: Secondary | ICD-10-CM | POA: Diagnosis not present

## 2017-03-31 DIAGNOSIS — G8250 Quadriplegia, unspecified: Secondary | ICD-10-CM | POA: Diagnosis not present

## 2017-03-31 DIAGNOSIS — E43 Unspecified severe protein-calorie malnutrition: Secondary | ICD-10-CM | POA: Diagnosis not present

## 2017-03-31 DIAGNOSIS — L89153 Pressure ulcer of sacral region, stage 3: Secondary | ICD-10-CM | POA: Diagnosis not present

## 2017-03-31 DIAGNOSIS — L97513 Non-pressure chronic ulcer of other part of right foot with necrosis of muscle: Secondary | ICD-10-CM | POA: Diagnosis not present

## 2017-03-31 DIAGNOSIS — L89114 Pressure ulcer of right upper back, stage 4: Secondary | ICD-10-CM | POA: Diagnosis not present

## 2017-03-31 DIAGNOSIS — D649 Anemia, unspecified: Secondary | ICD-10-CM | POA: Diagnosis not present

## 2017-03-31 DIAGNOSIS — F329 Major depressive disorder, single episode, unspecified: Secondary | ICD-10-CM | POA: Diagnosis not present

## 2017-03-31 DIAGNOSIS — E662 Morbid (severe) obesity with alveolar hypoventilation: Secondary | ICD-10-CM | POA: Diagnosis not present

## 2017-03-31 DIAGNOSIS — I10 Essential (primary) hypertension: Secondary | ICD-10-CM | POA: Diagnosis not present

## 2017-04-03 DIAGNOSIS — L89159 Pressure ulcer of sacral region, unspecified stage: Secondary | ICD-10-CM | POA: Diagnosis not present

## 2017-04-03 DIAGNOSIS — L89119 Pressure ulcer of right upper back, unspecified stage: Secondary | ICD-10-CM | POA: Diagnosis not present

## 2017-04-03 DIAGNOSIS — L89154 Pressure ulcer of sacral region, stage 4: Secondary | ICD-10-CM | POA: Diagnosis not present

## 2017-04-03 DIAGNOSIS — G8250 Quadriplegia, unspecified: Secondary | ICD-10-CM | POA: Diagnosis not present

## 2017-04-05 ENCOUNTER — Ambulatory Visit: Payer: Medicare Other | Admitting: Infectious Diseases

## 2017-04-10 DIAGNOSIS — S31102A Unspecified open wound of abdominal wall, epigastric region without penetration into peritoneal cavity, initial encounter: Secondary | ICD-10-CM | POA: Diagnosis not present

## 2017-04-10 DIAGNOSIS — Z933 Colostomy status: Secondary | ICD-10-CM | POA: Diagnosis not present

## 2017-04-10 DIAGNOSIS — S31809A Unspecified open wound of unspecified buttock, initial encounter: Secondary | ICD-10-CM | POA: Diagnosis not present

## 2017-04-10 DIAGNOSIS — L8944 Pressure ulcer of contiguous site of back, buttock and hip, stage 4: Secondary | ICD-10-CM | POA: Diagnosis not present

## 2017-04-11 DIAGNOSIS — S31102A Unspecified open wound of abdominal wall, epigastric region without penetration into peritoneal cavity, initial encounter: Secondary | ICD-10-CM | POA: Diagnosis not present

## 2017-04-11 DIAGNOSIS — S31809A Unspecified open wound of unspecified buttock, initial encounter: Secondary | ICD-10-CM | POA: Diagnosis not present

## 2017-04-11 DIAGNOSIS — Z933 Colostomy status: Secondary | ICD-10-CM | POA: Diagnosis not present

## 2017-04-11 DIAGNOSIS — L8944 Pressure ulcer of contiguous site of back, buttock and hip, stage 4: Secondary | ICD-10-CM | POA: Diagnosis not present

## 2017-04-12 ENCOUNTER — Other Ambulatory Visit: Payer: Self-pay | Admitting: Adult Health

## 2017-04-12 DIAGNOSIS — D649 Anemia, unspecified: Secondary | ICD-10-CM

## 2017-04-16 IMAGING — CR DG CHEST 1V PORT
1 series · 2 of 2 positions shown · non-contrast
Comparison: Portable exam 7699 hours compared to 07/15/2014

CLINICAL DATA: Hypertension, awoke with chills, fever and nausea
today, had incision and debridement yesterday, quadriplegic,
hypertension, GERD

EXAM:
PORTABLE CHEST - 1 VIEW

[Series 1: AP · U · 2 of 2 slices shown]
[im 1/2]
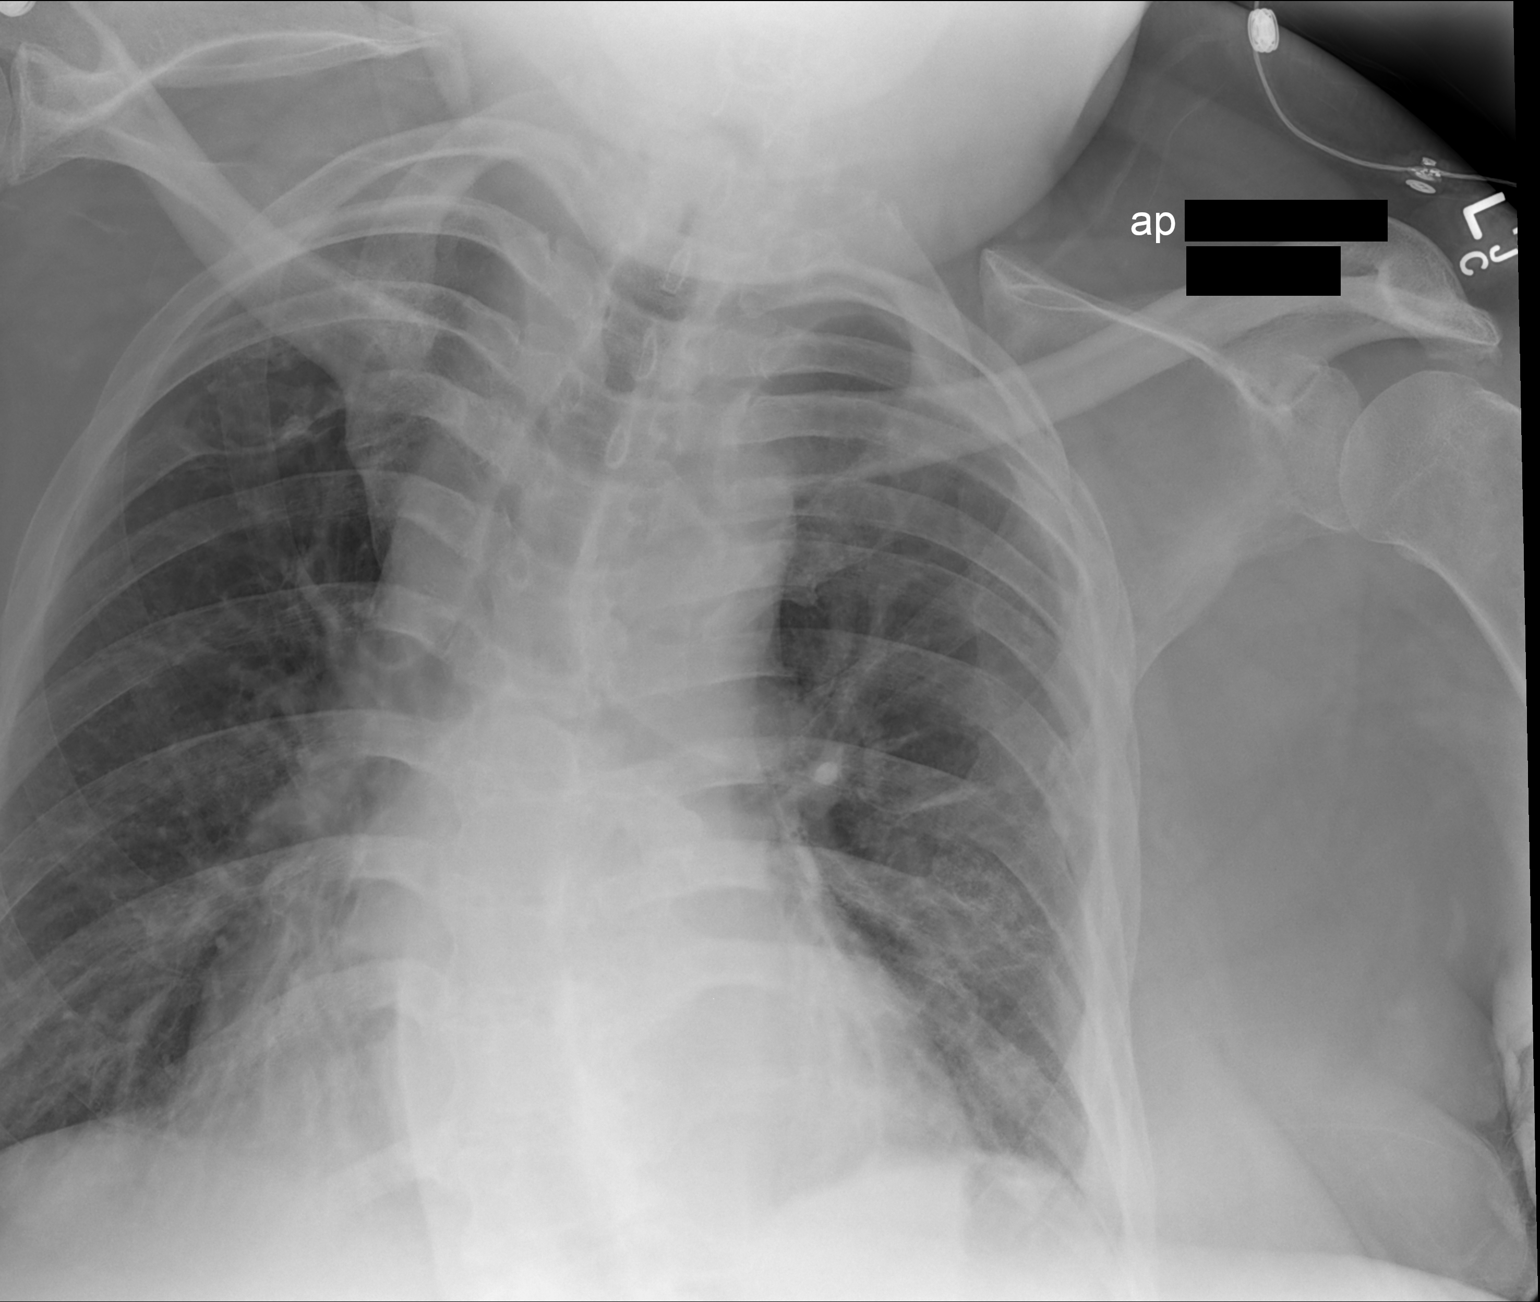
[im 2/2]
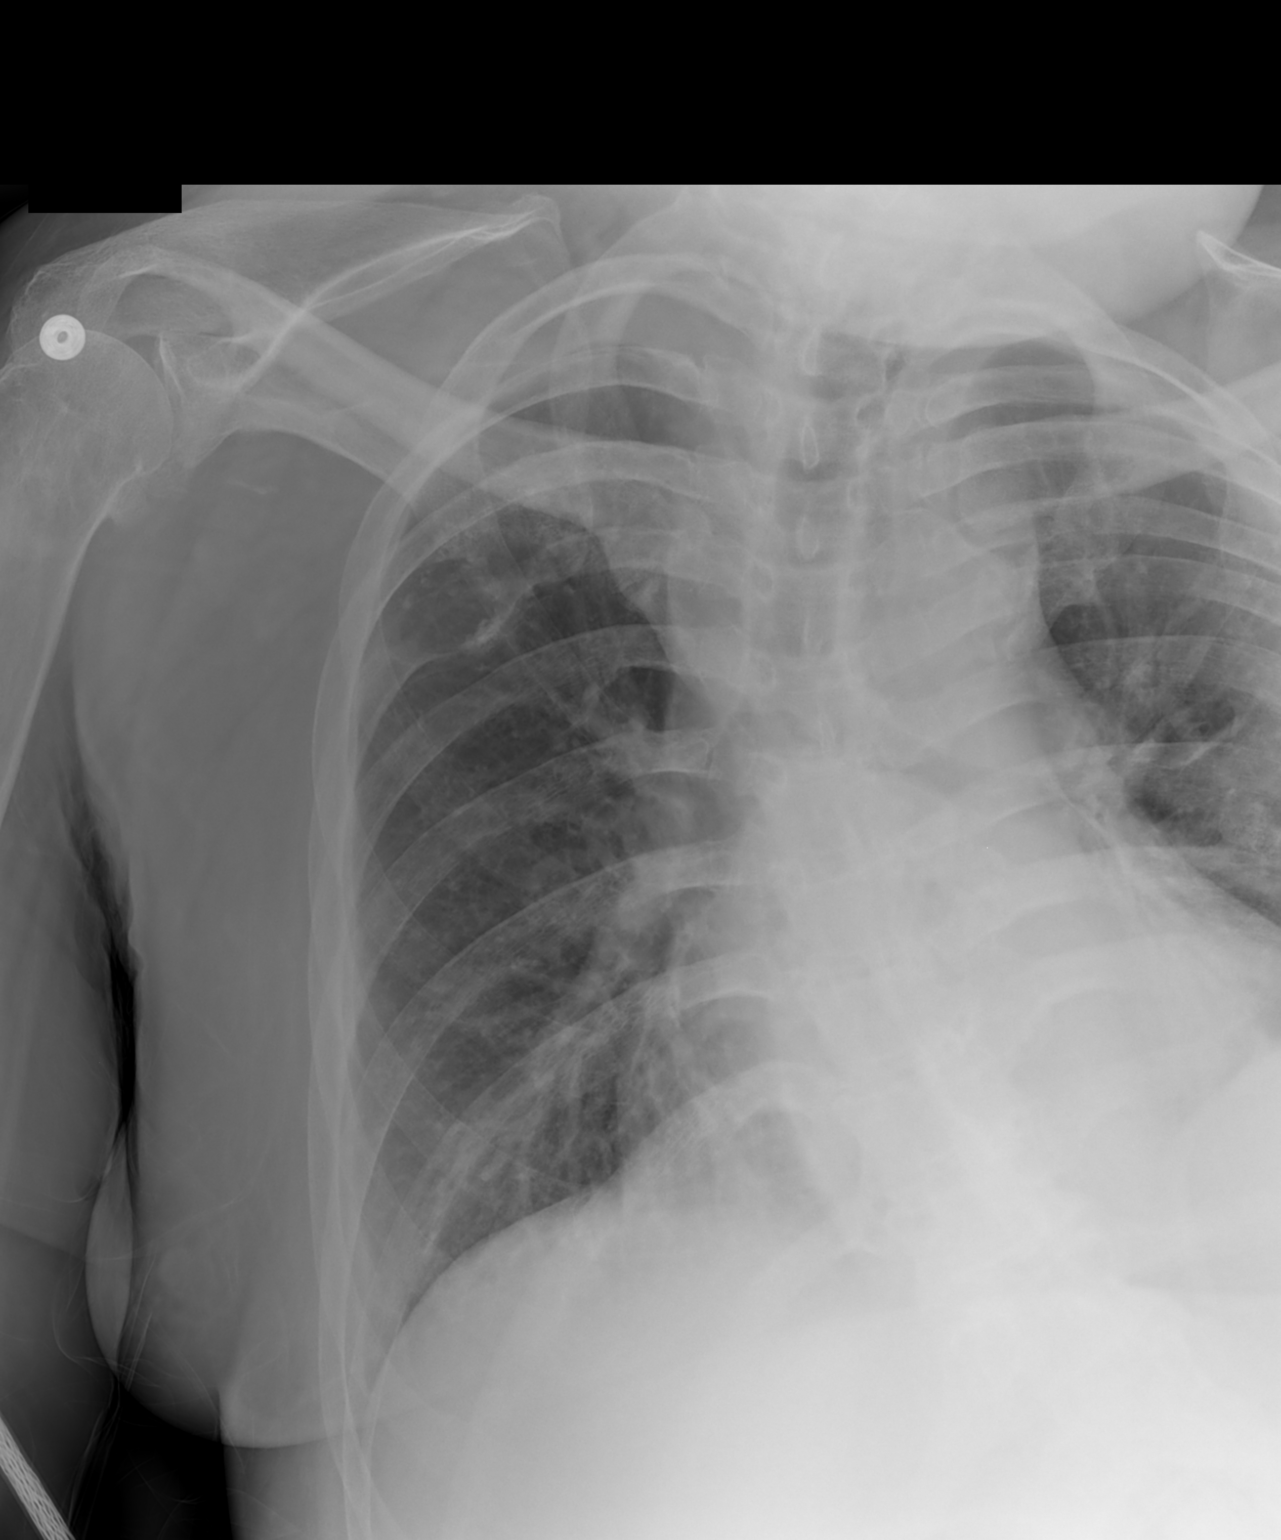

[2 of 2 positions shown; findings below may reference images not displayed]

FINDINGS: Enlargement of cardiac silhouette.

Slightly prominent superior mediastinum unchanged.

Pulmonary vascularity normal.

Minimal RIGHT basilar atelectasis.

Lungs otherwise clear.

No pleural effusion or pneumothorax.

Lytic bone lesion 4.4 cm length posterior RIGHT fifth rib.

Exophytic sclerotic bone lesion versus extrapleural density lateral
mid LEFT chest unchanged.

No other definite focal osseous abnormalities.
IMPRESSION: Enlargement of cardiac silhouette with minimal RIGHT basilar
atelectasis.

Lytic bone lesion posterior RIGHT fifth rib, question myeloma or a
lytic osseous metastasis.

## 2017-04-17 ENCOUNTER — Encounter (HOSPITAL_BASED_OUTPATIENT_CLINIC_OR_DEPARTMENT_OTHER): Payer: Medicare Other | Attending: Internal Medicine

## 2017-04-17 DIAGNOSIS — S91301A Unspecified open wound, right foot, initial encounter: Secondary | ICD-10-CM | POA: Diagnosis not present

## 2017-04-17 DIAGNOSIS — L89893 Pressure ulcer of other site, stage 3: Secondary | ICD-10-CM | POA: Diagnosis not present

## 2017-04-17 DIAGNOSIS — L89894 Pressure ulcer of other site, stage 4: Secondary | ICD-10-CM | POA: Insufficient documentation

## 2017-04-17 DIAGNOSIS — L89222 Pressure ulcer of left hip, stage 2: Secondary | ICD-10-CM | POA: Diagnosis not present

## 2017-04-17 DIAGNOSIS — X58XXXA Exposure to other specified factors, initial encounter: Secondary | ICD-10-CM | POA: Diagnosis not present

## 2017-04-17 DIAGNOSIS — S61204A Unspecified open wound of right ring finger without damage to nail, initial encounter: Secondary | ICD-10-CM | POA: Diagnosis not present

## 2017-04-17 DIAGNOSIS — S81802A Unspecified open wound, left lower leg, initial encounter: Secondary | ICD-10-CM | POA: Diagnosis not present

## 2017-04-17 DIAGNOSIS — G4730 Sleep apnea, unspecified: Secondary | ICD-10-CM | POA: Insufficient documentation

## 2017-04-17 DIAGNOSIS — S61202A Unspecified open wound of right middle finger without damage to nail, initial encounter: Secondary | ICD-10-CM | POA: Insufficient documentation

## 2017-04-17 DIAGNOSIS — L89154 Pressure ulcer of sacral region, stage 4: Secondary | ICD-10-CM | POA: Insufficient documentation

## 2017-04-17 DIAGNOSIS — I10 Essential (primary) hypertension: Secondary | ICD-10-CM | POA: Diagnosis not present

## 2017-04-17 DIAGNOSIS — G8250 Quadriplegia, unspecified: Secondary | ICD-10-CM | POA: Diagnosis not present

## 2017-04-18 DIAGNOSIS — A419 Sepsis, unspecified organism: Secondary | ICD-10-CM | POA: Diagnosis not present

## 2017-04-18 DIAGNOSIS — L89309 Pressure ulcer of unspecified buttock, unspecified stage: Secondary | ICD-10-CM | POA: Diagnosis not present

## 2017-04-18 DIAGNOSIS — L89159 Pressure ulcer of sacral region, unspecified stage: Secondary | ICD-10-CM | POA: Diagnosis not present

## 2017-04-18 DIAGNOSIS — I6789 Other cerebrovascular disease: Secondary | ICD-10-CM | POA: Diagnosis not present

## 2017-04-18 DIAGNOSIS — G8250 Quadriplegia, unspecified: Secondary | ICD-10-CM | POA: Diagnosis not present

## 2017-04-18 DIAGNOSIS — L89119 Pressure ulcer of right upper back, unspecified stage: Secondary | ICD-10-CM | POA: Diagnosis not present

## 2017-04-18 DIAGNOSIS — G839 Paralytic syndrome, unspecified: Secondary | ICD-10-CM | POA: Diagnosis not present

## 2017-04-18 DIAGNOSIS — R202 Paresthesia of skin: Secondary | ICD-10-CM | POA: Diagnosis not present

## 2017-04-18 IMAGING — US IR US GUIDE VASC ACCESS RIGHT
1 series · 2 of 2 positions shown · non-contrast
Comparison: none

CLINICAL DATA: 49-year-old male with a history of chronic anemia
requiring central catheter placement.

EXAM:
CENTRAL LINE WITH ULTRASOUND AND FLUOROSCOPY
FLUOROSCOPY TIME:  Twelve seconds
TECHNIQUE: The procedure, risks, benefits, and alternatives were explained to
the patient. Questions regarding the procedure were encouraged and
answered. The patient understands and consents to the procedure.

[Series 1: ir us guide vasc access right · 2 of 2 slices shown]
[im 1/2]
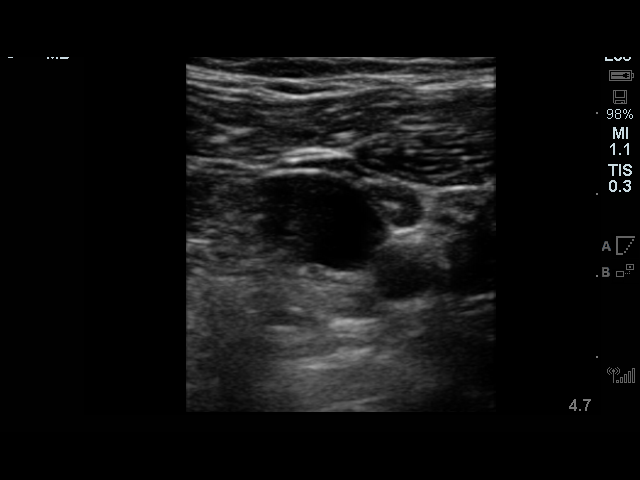
[im 2/2]
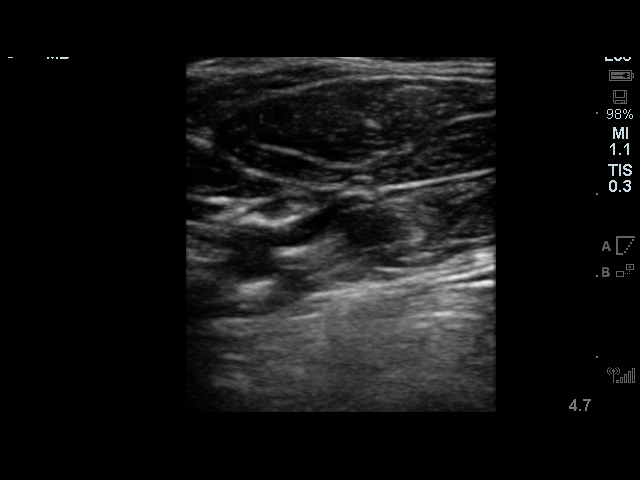

[2 of 2 positions shown; findings below may reference images not displayed]

After written informed consent was obtained, patient was placed in
the supine position on angiographic table.

Patency of the right internal jugular vein was confirmed with
ultrasound with image documentation. An appropriate skin site was
determined. Skin site was marked. Region was prepped using maximum
barrier technique including cap and mask, sterile gown, sterile
gloves, large sterile sheet, and Chlorhexidine as cutaneous
antisepsis.

Once the patient is prepped and draped in usual sterile fashion, the
skin and subcutaneous tissues overlying the right internal jugular
vein were generously infiltrated 1% lidocaine without epinephrine.
The right internal jugular vein was then accessed with a
micropuncture set under ultrasound guidance. With venous blood flow
returned a micro wire was placed into the right heart. The peel-away
sheath was then placed over the micro wire after a small incision
was made in the skin. The wire was used to determine appropriate
length of the internal catheter.

Catheter was ligated 18 cm and then placed through the peel-away
sheath which was removed.

The catheter was sutured in position at the neck.

A final image was stored demonstrating the tip of the catheter in
the cavoatrial junction.

The patient tolerated the procedure well and remained
hemodynamically stable throughout.

No complications were encountered and no significant blood loss was
encountered.
IMPRESSION: Status post placement of double-lumen 5 French central catheter via
right internal jugular vein approach. Catheter ready for use.

## 2017-04-19 ENCOUNTER — Encounter (HOSPITAL_COMMUNITY): Payer: Self-pay | Admitting: Emergency Medicine

## 2017-04-19 ENCOUNTER — Emergency Department (HOSPITAL_COMMUNITY)
Admission: EM | Admit: 2017-04-19 | Discharge: 2017-04-19 | Disposition: A | Discharge status: Home / Self Care | Payer: Medicare Other | Attending: Emergency Medicine | Admitting: Emergency Medicine

## 2017-04-19 ENCOUNTER — Emergency Department (HOSPITAL_COMMUNITY): Payer: Medicare Other

## 2017-04-19 DIAGNOSIS — Z79899 Other long term (current) drug therapy: Secondary | ICD-10-CM | POA: Insufficient documentation

## 2017-04-19 DIAGNOSIS — Z9359 Other cystostomy status: Secondary | ICD-10-CM

## 2017-04-19 DIAGNOSIS — R5383 Other fatigue: Secondary | ICD-10-CM | POA: Diagnosis not present

## 2017-04-19 DIAGNOSIS — Z960 Presence of urogenital implants: Secondary | ICD-10-CM | POA: Diagnosis not present

## 2017-04-19 DIAGNOSIS — L89159 Pressure ulcer of sacral region, unspecified stage: Secondary | ICD-10-CM | POA: Diagnosis not present

## 2017-04-19 DIAGNOSIS — L98429 Non-pressure chronic ulcer of back with unspecified severity: Secondary | ICD-10-CM

## 2017-04-19 DIAGNOSIS — I10 Essential (primary) hypertension: Secondary | ICD-10-CM | POA: Insufficient documentation

## 2017-04-19 DIAGNOSIS — T83091A Other mechanical complication of indwelling urethral catheter, initial encounter: Secondary | ICD-10-CM | POA: Diagnosis not present

## 2017-04-19 DIAGNOSIS — N390 Urinary tract infection, site not specified: Secondary | ICD-10-CM | POA: Diagnosis not present

## 2017-04-19 DIAGNOSIS — T83098A Other mechanical complication of other indwelling urethral catheter, initial encounter: Secondary | ICD-10-CM | POA: Diagnosis not present

## 2017-04-19 DIAGNOSIS — R05 Cough: Secondary | ICD-10-CM | POA: Diagnosis not present

## 2017-04-19 DIAGNOSIS — R509 Fever, unspecified: Secondary | ICD-10-CM | POA: Diagnosis present

## 2017-04-19 LAB — COMPREHENSIVE METABOLIC PANEL
ALK PHOS: 85 U/L (ref 38–126)
ALT: 35 U/L (ref 17–63)
AST: 22 U/L (ref 15–41)
Albumin: 2.8 g/dL — ABNORMAL LOW (ref 3.5–5.0)
Anion gap: 8 (ref 5–15)
BILIRUBIN TOTAL: 0.7 mg/dL (ref 0.3–1.2)
BUN: 19 mg/dL (ref 6–20)
CALCIUM: 8.7 mg/dL — AB (ref 8.9–10.3)
CO2: 20 mmol/L — ABNORMAL LOW (ref 22–32)
CREATININE: 0.43 mg/dL — AB (ref 0.61–1.24)
Chloride: 111 mmol/L (ref 101–111)
GFR calc Af Amer: >60 mL/min (ref >60)
Glucose, Bld: 109 mg/dL — ABNORMAL HIGH (ref 65–99)
POTASSIUM: 3.5 mmol/L (ref 3.5–5.1)
Sodium: 139 mmol/L (ref 135–145)
TOTAL PROTEIN: 7.8 g/dL (ref 6.5–8.1)

## 2017-04-19 LAB — URINALYSIS, ROUTINE W REFLEX MICROSCOPIC
BILIRUBIN URINE: NEGATIVE
GLUCOSE, UA: NEGATIVE mg/dL
KETONES UR: NEGATIVE mg/dL
Nitrite: NEGATIVE
PH: 5 (ref 5.0–8.0)
Protein, ur: NEGATIVE mg/dL
SPECIFIC GRAVITY, URINE: 1.016 (ref 1.005–1.030)

## 2017-04-19 LAB — CBC WITH DIFFERENTIAL/PLATELET
BASOS ABS: 0 10*3/uL (ref 0.0–0.1)
BASOS PCT: 0 %
EOS ABS: 0.6 10*3/uL (ref 0.0–0.7)
EOS PCT: 6 %
HCT: 30.4 % — ABNORMAL LOW (ref 39.0–52.0)
Hemoglobin: 9.2 g/dL — ABNORMAL LOW (ref 13.0–17.0)
Lymphocytes Relative: 17 %
Lymphs Abs: 1.5 10*3/uL (ref 0.7–4.0)
MCH: 24.5 pg — ABNORMAL LOW (ref 26.0–34.0)
MCHC: 30.3 g/dL (ref 30.0–36.0)
MCV: 81.1 fL (ref 78.0–100.0)
Monocytes Absolute: 1.2 10*3/uL — ABNORMAL HIGH (ref 0.1–1.0)
Monocytes Relative: 14 %
Neutro Abs: 5.7 10*3/uL (ref 1.7–7.7)
Neutrophils Relative %: 63 %
PLATELETS: 302 10*3/uL (ref 150–400)
RBC: 3.75 MIL/uL — AB (ref 4.22–5.81)
RDW: 15.6 % — AB (ref 11.5–15.5)
WBC: 9 10*3/uL (ref 4.0–10.5)

## 2017-04-19 LAB — I-STAT CG4 LACTIC ACID, ED: Lactic Acid, Venous: 0.75 mmol/L (ref 0.5–1.9)

## 2017-04-19 IMAGING — DX DG CHEST 1V PORT
1 series · 2 of 2 positions shown · non-contrast
Comparison: Chest radiograph performed 09/02/2014

CLINICAL DATA: Acute onset of shortness of breath. Initial
encounter.

EXAM:
PORTABLE CHEST - 1 VIEW

[Series 1: chest ap · 0.14mm/px · 2 of 2 slices shown]
[im 1/2]
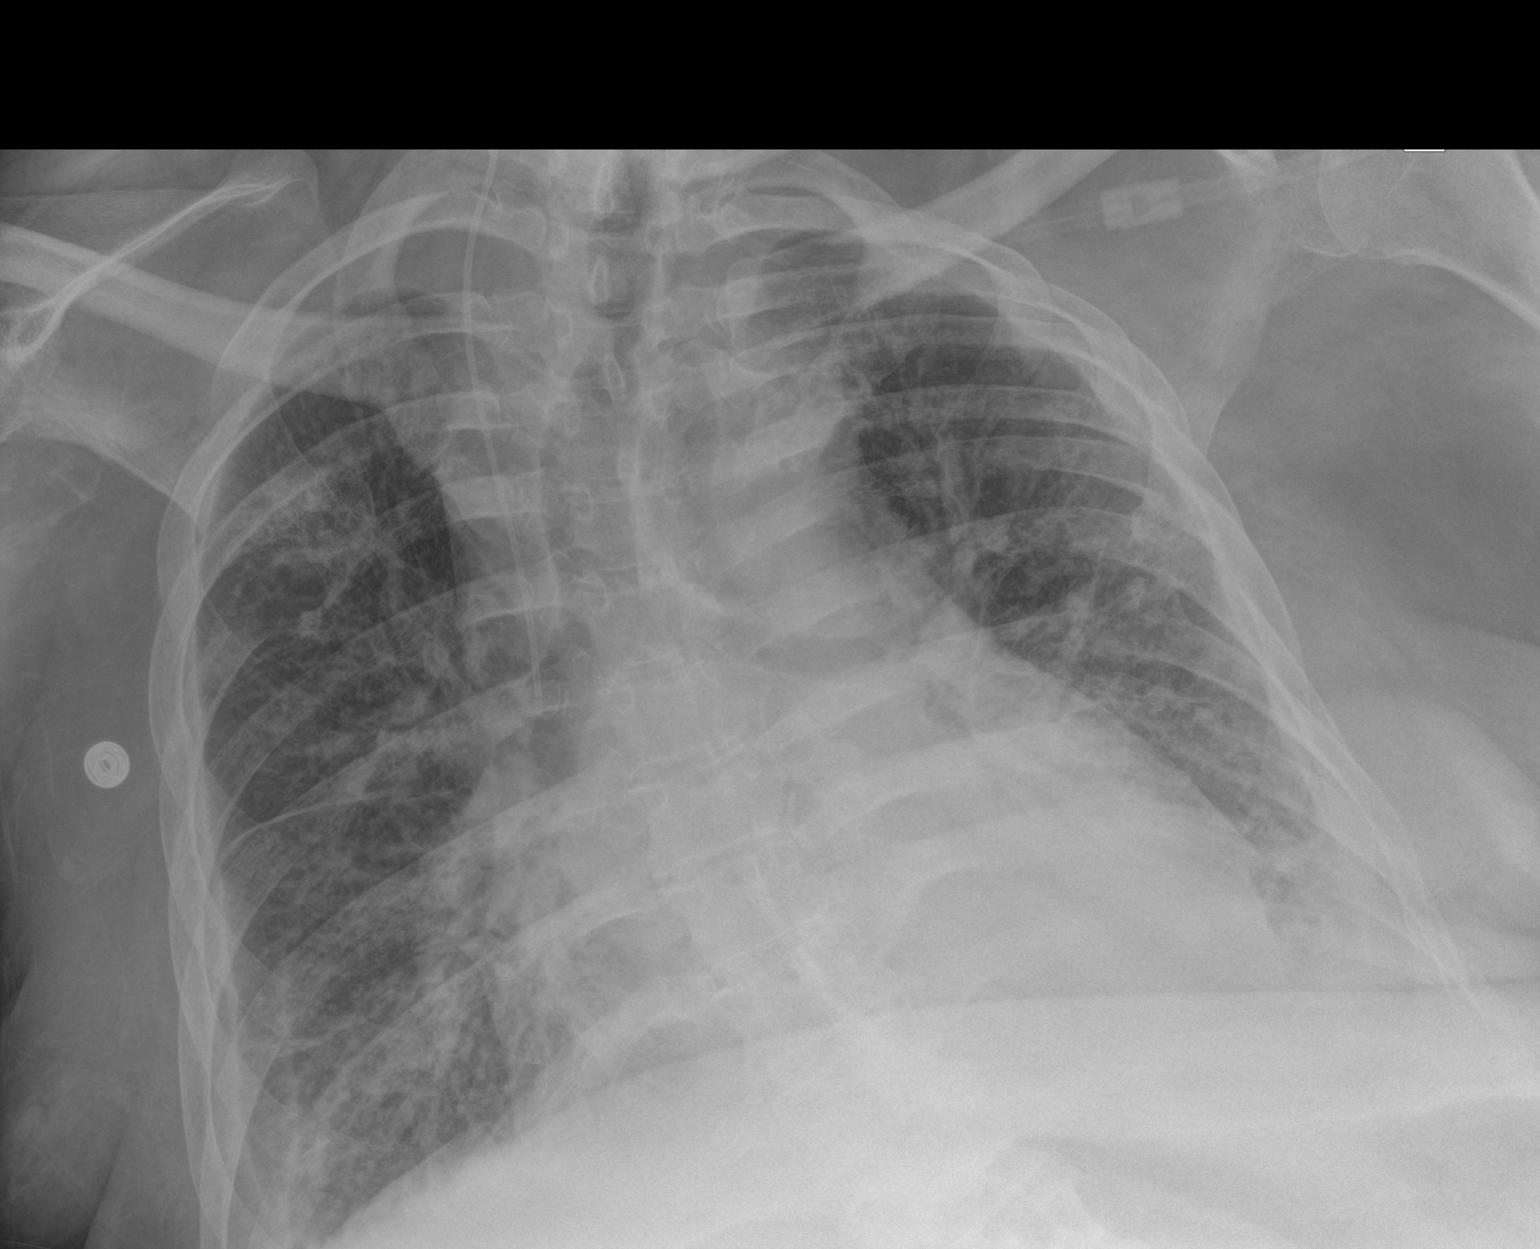
[im 2/2]
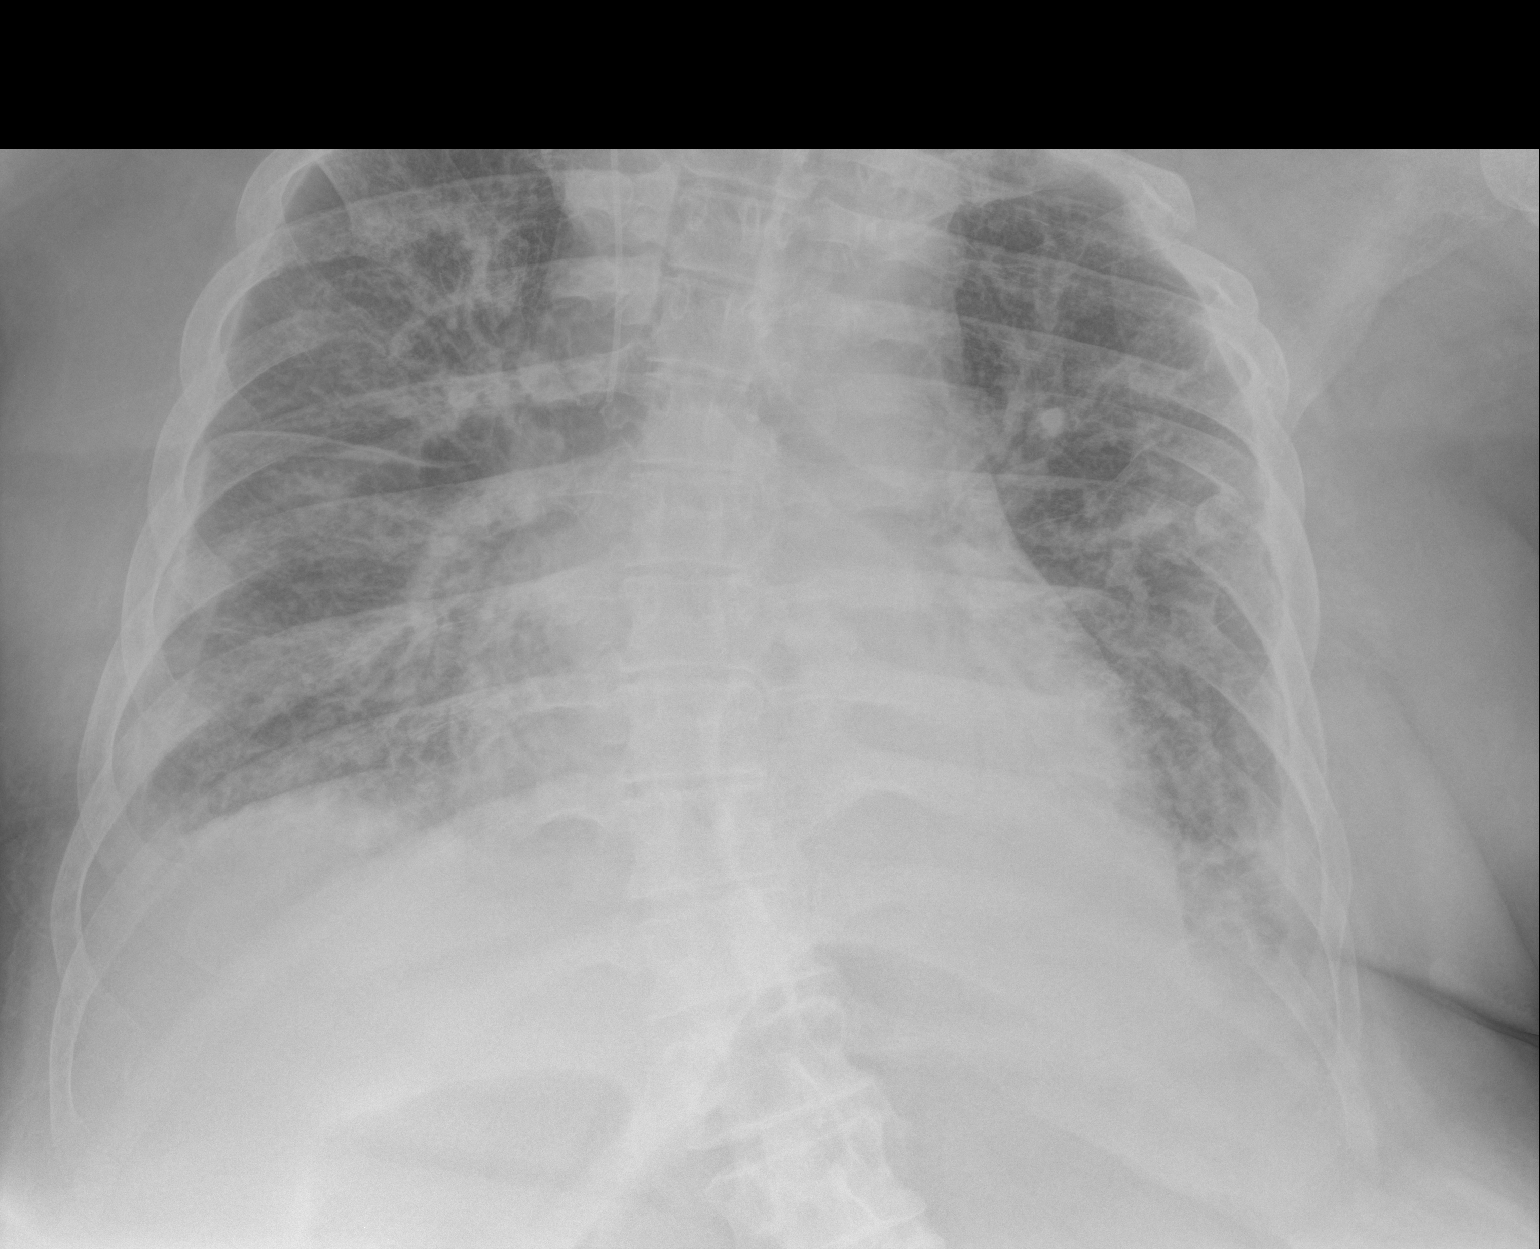

[2 of 2 positions shown; findings below may reference images not displayed]

FINDINGS: The lungs are well-aerated. Small bilateral pleural effusions are
seen, with underlying vascular congestion and mildly increased
interstitial markings, possibly reflecting mild interstitial edema.
No pneumothorax is identified.

The cardiomediastinal silhouette is mildly enlarged. No acute
osseous abnormalities are seen. A right-sided IJ line is noted
ending about the mid to distal SVC. Cervical spinal fusion hardware
is partially characterized.
IMPRESSION: Small bilateral pleural effusions, with underlying vascular
congestion and mild interstitial markings, possibly reflecting mild
interstitial edema. Mild cardiomegaly noted.

## 2017-04-19 MED ORDER — HEPARIN SOD (PORK) LOCK FLUSH 100 UNIT/ML IV SOLN
500.0000 [IU] | Freq: Once | INTRAVENOUS | Status: AC
  Administered 2017-04-19: 500 [IU]
  Filled 2017-04-19: qty 5

## 2017-04-19 NOTE — ED Notes (Signed)
Atttempting to call sister again

## 2017-04-19 NOTE — ED Notes (Signed)
Patient still trying to get his sister so she can open the door,.

## 2017-04-19 NOTE — ED Notes (Signed)
[place].

## 2017-04-19 NOTE — ED Notes (Signed)
Patient is ready for discharge  However he can't get into his house attempting to call sister . Will call [facility] when he is able to reach his sister

## 2017-04-19 NOTE — ED Notes (Signed)
Patient was able to reach his nephew PTAR called

## 2017-04-19 NOTE — Discharge Instructions (Signed)
1. Medications: usual home medications 2. Treatment: rest, drink plenty of fluids, Urine cultures are pending, if they grow bacteria we will call you with an antibiotic prescription 3. Follow Up: Please followup with your primary doctor in 2 days for discussion of your diagnoses and further evaluation after today's visit; Please return to the ER for development of fever, worsening symptoms, vomiting, low blood pressure or other concerns

## 2017-04-19 NOTE — ED Triage Notes (Signed)
Per EMS, pt coming from home with c/o possible UTI. Pt has a chronic foley, noticed that the urine has become darker and has a strong odor x 1 day. Pt also reports feeling feverish, afebrile on arrival. Also c/o drainage from wounds. EMS vitals: BP-95/64, HR-82, CBG-112
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
## 2017-04-20 LAB — URINE CULTURE

## 2017-04-21 ENCOUNTER — Other Ambulatory Visit: Payer: Medicare Other

## 2017-04-21 ENCOUNTER — Ambulatory Visit: Payer: Medicare Other

## 2017-04-21 ENCOUNTER — Ambulatory Visit: Payer: Medicare Other | Admitting: Hematology

## 2017-04-22 ENCOUNTER — Telehealth: Payer: Self-pay | Admitting: Hematology

## 2017-04-22 NOTE — Telephone Encounter (Signed)
Called regarding appointment

## 2017-04-24 LAB — CULTURE, BLOOD (ROUTINE X 2)
Culture: NO GROWTH
Special Requests: ADEQUATE

## 2017-04-25 DIAGNOSIS — L8944 Pressure ulcer of contiguous site of back, buttock and hip, stage 4: Secondary | ICD-10-CM | POA: Diagnosis not present

## 2017-04-25 DIAGNOSIS — Z933 Colostomy status: Secondary | ICD-10-CM | POA: Diagnosis not present

## 2017-04-25 DIAGNOSIS — S31102A Unspecified open wound of abdominal wall, epigastric region without penetration into peritoneal cavity, initial encounter: Secondary | ICD-10-CM | POA: Diagnosis not present

## 2017-04-25 DIAGNOSIS — S31809A Unspecified open wound of unspecified buttock, initial encounter: Secondary | ICD-10-CM | POA: Diagnosis not present

## 2017-04-27 ENCOUNTER — Ambulatory Visit: Payer: Medicare Other | Admitting: Hematology

## 2017-04-27 ENCOUNTER — Ambulatory Visit: Payer: Medicare Other

## 2017-04-27 ENCOUNTER — Other Ambulatory Visit: Payer: Medicare Other

## 2017-04-28 DIAGNOSIS — D649 Anemia, unspecified: Secondary | ICD-10-CM | POA: Diagnosis not present

## 2017-04-28 DIAGNOSIS — G8250 Quadriplegia, unspecified: Secondary | ICD-10-CM | POA: Diagnosis not present

## 2017-04-28 DIAGNOSIS — L89154 Pressure ulcer of sacral region, stage 4: Secondary | ICD-10-CM | POA: Diagnosis not present

## 2017-04-28 DIAGNOSIS — L89114 Pressure ulcer of right upper back, stage 4: Secondary | ICD-10-CM | POA: Diagnosis not present

## 2017-04-28 DIAGNOSIS — I10 Essential (primary) hypertension: Secondary | ICD-10-CM | POA: Diagnosis not present

## 2017-04-28 DIAGNOSIS — E662 Morbid (severe) obesity with alveolar hypoventilation: Secondary | ICD-10-CM | POA: Diagnosis not present

## 2017-04-28 DIAGNOSIS — L89153 Pressure ulcer of sacral region, stage 3: Secondary | ICD-10-CM | POA: Diagnosis not present

## 2017-04-28 DIAGNOSIS — F329 Major depressive disorder, single episode, unspecified: Secondary | ICD-10-CM | POA: Diagnosis not present

## 2017-04-28 DIAGNOSIS — E43 Unspecified severe protein-calorie malnutrition: Secondary | ICD-10-CM | POA: Diagnosis not present

## 2017-04-28 DIAGNOSIS — L97513 Non-pressure chronic ulcer of other part of right foot with necrosis of muscle: Secondary | ICD-10-CM | POA: Diagnosis not present

## 2017-05-01 DIAGNOSIS — L89222 Pressure ulcer of left hip, stage 2: Secondary | ICD-10-CM | POA: Diagnosis not present

## 2017-05-01 DIAGNOSIS — L89894 Pressure ulcer of other site, stage 4: Secondary | ICD-10-CM | POA: Diagnosis not present

## 2017-05-02 ENCOUNTER — Encounter: Payer: Self-pay | Admitting: Neurology

## 2017-05-03 ENCOUNTER — Encounter: Payer: Self-pay | Admitting: Neurology

## 2017-05-03 ENCOUNTER — Ambulatory Visit (INDEPENDENT_AMBULATORY_CARE_PROVIDER_SITE_OTHER): Payer: Medicare Other | Admitting: Neurology

## 2017-05-03 VITALS — BP 101/72 | HR 66 | Ht 72.0 in | Wt 212.0 lb

## 2017-05-03 DIAGNOSIS — T50905A Adverse effect of unspecified drugs, medicaments and biological substances, initial encounter: Secondary | ICD-10-CM

## 2017-05-03 DIAGNOSIS — G4730 Sleep apnea, unspecified: Secondary | ICD-10-CM | POA: Diagnosis not present

## 2017-05-03 DIAGNOSIS — J986 Disorders of diaphragm: Secondary | ICD-10-CM

## 2017-05-03 DIAGNOSIS — L89119 Pressure ulcer of right upper back, unspecified stage: Secondary | ICD-10-CM | POA: Diagnosis not present

## 2017-05-03 DIAGNOSIS — L89159 Pressure ulcer of sacral region, unspecified stage: Secondary | ICD-10-CM | POA: Diagnosis not present

## 2017-05-03 DIAGNOSIS — E66813 Obesity, class 3: Secondary | ICD-10-CM

## 2017-05-03 DIAGNOSIS — L89154 Pressure ulcer of sacral region, stage 4: Secondary | ICD-10-CM | POA: Diagnosis not present

## 2017-05-03 DIAGNOSIS — G8250 Quadriplegia, unspecified: Secondary | ICD-10-CM | POA: Diagnosis not present

## 2017-05-03 DIAGNOSIS — G8253 Quadriplegia, C5-C7 complete: Secondary | ICD-10-CM

## 2017-05-03 HISTORY — DX: Quadriplegia, C5-C7 complete: G82.53
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
## 2017-05-04 ENCOUNTER — Inpatient Hospital Stay (HOSPITAL_COMMUNITY): Payer: Medicare Other

## 2017-05-04 ENCOUNTER — Inpatient Hospital Stay (HOSPITAL_COMMUNITY)
Admission: EM | Admit: 2017-05-04 | Discharge: 2017-05-07 | DRG: 698 | Disposition: A | Discharge status: Home / Self Care | Payer: Medicare Other | Attending: Family Medicine | Admitting: Family Medicine

## 2017-05-04 ENCOUNTER — Emergency Department (HOSPITAL_COMMUNITY): Payer: Medicare Other

## 2017-05-04 ENCOUNTER — Encounter (HOSPITAL_COMMUNITY): Payer: Self-pay | Admitting: Emergency Medicine

## 2017-05-04 DIAGNOSIS — G4733 Obstructive sleep apnea (adult) (pediatric): Secondary | ICD-10-CM | POA: Diagnosis present

## 2017-05-04 DIAGNOSIS — B965 Pseudomonas (aeruginosa) (mallei) (pseudomallei) as the cause of diseases classified elsewhere: Secondary | ICD-10-CM | POA: Diagnosis present

## 2017-05-04 DIAGNOSIS — L89154 Pressure ulcer of sacral region, stage 4: Secondary | ICD-10-CM | POA: Diagnosis not present

## 2017-05-04 DIAGNOSIS — E43 Unspecified severe protein-calorie malnutrition: Secondary | ICD-10-CM | POA: Diagnosis present

## 2017-05-04 DIAGNOSIS — I10 Essential (primary) hypertension: Secondary | ICD-10-CM | POA: Diagnosis present

## 2017-05-04 DIAGNOSIS — Z803 Family history of malignant neoplasm of breast: Secondary | ICD-10-CM

## 2017-05-04 DIAGNOSIS — Z87440 Personal history of urinary (tract) infections: Secondary | ICD-10-CM

## 2017-05-04 DIAGNOSIS — B9620 Unspecified Escherichia coli [E. coli] as the cause of diseases classified elsewhere: Secondary | ICD-10-CM | POA: Diagnosis not present

## 2017-05-04 DIAGNOSIS — Z9359 Other cystostomy status: Secondary | ICD-10-CM

## 2017-05-04 DIAGNOSIS — T83511A Infection and inflammatory reaction due to indwelling urethral catheter, initial encounter: Secondary | ICD-10-CM

## 2017-05-04 DIAGNOSIS — D638 Anemia in other chronic diseases classified elsewhere: Secondary | ICD-10-CM | POA: Diagnosis present

## 2017-05-04 DIAGNOSIS — Z8701 Personal history of pneumonia (recurrent): Secondary | ICD-10-CM

## 2017-05-04 DIAGNOSIS — T83518A Infection and inflammatory reaction due to other urinary catheter, initial encounter: Secondary | ICD-10-CM | POA: Diagnosis not present

## 2017-05-04 DIAGNOSIS — Z981 Arthrodesis status: Secondary | ICD-10-CM

## 2017-05-04 DIAGNOSIS — M8660 Other chronic osteomyelitis, unspecified site: Secondary | ICD-10-CM | POA: Diagnosis present

## 2017-05-04 DIAGNOSIS — Z888 Allergy status to other drugs, medicaments and biological substances status: Secondary | ICD-10-CM

## 2017-05-04 DIAGNOSIS — Z79899 Other long term (current) drug therapy: Secondary | ICD-10-CM

## 2017-05-04 DIAGNOSIS — Z6828 Body mass index (BMI) 28.0-28.9, adult: Secondary | ICD-10-CM

## 2017-05-04 DIAGNOSIS — A419 Sepsis, unspecified organism: Secondary | ICD-10-CM | POA: Diagnosis present

## 2017-05-04 DIAGNOSIS — N390 Urinary tract infection, site not specified: Secondary | ICD-10-CM | POA: Diagnosis present

## 2017-05-04 DIAGNOSIS — Z8711 Personal history of peptic ulcer disease: Secondary | ICD-10-CM | POA: Diagnosis not present

## 2017-05-04 DIAGNOSIS — D649 Anemia, unspecified: Secondary | ICD-10-CM | POA: Diagnosis not present

## 2017-05-04 DIAGNOSIS — R112 Nausea with vomiting, unspecified: Secondary | ICD-10-CM | POA: Diagnosis not present

## 2017-05-04 DIAGNOSIS — G8250 Quadriplegia, unspecified: Secondary | ICD-10-CM | POA: Diagnosis not present

## 2017-05-04 DIAGNOSIS — Z881 Allergy status to other antibiotic agents status: Secondary | ICD-10-CM | POA: Diagnosis not present

## 2017-05-04 DIAGNOSIS — N281 Cyst of kidney, acquired: Secondary | ICD-10-CM | POA: Diagnosis not present

## 2017-05-04 DIAGNOSIS — E876 Hypokalemia: Secondary | ICD-10-CM | POA: Diagnosis not present

## 2017-05-04 DIAGNOSIS — K210 Gastro-esophageal reflux disease with esophagitis: Secondary | ICD-10-CM | POA: Diagnosis present

## 2017-05-04 DIAGNOSIS — F329 Major depressive disorder, single episode, unspecified: Secondary | ICD-10-CM | POA: Diagnosis present

## 2017-05-04 DIAGNOSIS — B964 Proteus (mirabilis) (morganii) as the cause of diseases classified elsewhere: Secondary | ICD-10-CM | POA: Diagnosis present

## 2017-05-04 DIAGNOSIS — K3189 Other diseases of stomach and duodenum: Secondary | ICD-10-CM | POA: Diagnosis present

## 2017-05-04 DIAGNOSIS — R1111 Vomiting without nausea: Secondary | ICD-10-CM | POA: Diagnosis not present

## 2017-05-04 DIAGNOSIS — Z933 Colostomy status: Secondary | ICD-10-CM | POA: Diagnosis not present

## 2017-05-04 DIAGNOSIS — J9811 Atelectasis: Secondary | ICD-10-CM | POA: Diagnosis not present

## 2017-05-04 DIAGNOSIS — Y846 Urinary catheterization as the cause of abnormal reaction of the patient, or of later complication, without mention of misadventure at the time of the procedure: Secondary | ICD-10-CM | POA: Diagnosis present

## 2017-05-04 DIAGNOSIS — R509 Fever, unspecified: Secondary | ICD-10-CM | POA: Diagnosis not present

## 2017-05-04 DIAGNOSIS — Z833 Family history of diabetes mellitus: Secondary | ICD-10-CM

## 2017-05-04 LAB — COMPREHENSIVE METABOLIC PANEL
ALBUMIN: 2.8 g/dL — AB (ref 3.5–5.0)
ALT: 37 U/L (ref 17–63)
ANION GAP: 7 (ref 5–15)
AST: 31 U/L (ref 15–41)
Alkaline Phosphatase: 78 U/L (ref 38–126)
BILIRUBIN TOTAL: 0.4 mg/dL (ref 0.3–1.2)
BUN: 23 mg/dL — ABNORMAL HIGH (ref 6–20)
CALCIUM: 8.6 mg/dL — AB (ref 8.9–10.3)
CO2: 23 mmol/L (ref 22–32)
Chloride: 108 mmol/L (ref 101–111)
Creatinine, Ser: 0.7 mg/dL (ref 0.61–1.24)
GLUCOSE: 108 mg/dL — AB (ref 65–99)
POTASSIUM: 4 mmol/L (ref 3.5–5.1)
Sodium: 138 mmol/L (ref 135–145)
TOTAL PROTEIN: 8.2 g/dL — AB (ref 6.5–8.1)

## 2017-05-04 LAB — URINALYSIS, ROUTINE W REFLEX MICROSCOPIC
Bilirubin Urine: NEGATIVE
GLUCOSE, UA: NEGATIVE mg/dL
KETONES UR: NEGATIVE mg/dL
Nitrite: POSITIVE — AB
PROTEIN: NEGATIVE mg/dL
Specific Gravity, Urine: 1.015 (ref 1.005–1.030)
pH: 6 (ref 5.0–8.0)

## 2017-05-04 LAB — C DIFFICILE QUICK SCREEN W PCR REFLEX
C DIFFICILE (CDIFF) TOXIN: NEGATIVE
C Diff antigen: NEGATIVE
C Diff interpretation: NOT DETECTED

## 2017-05-04 LAB — CBC WITH DIFFERENTIAL/PLATELET
BASOS PCT: 0 %
Basophils Absolute: 0 10*3/uL (ref 0.0–0.1)
Eosinophils Absolute: 0.2 10*3/uL (ref 0.0–0.7)
Eosinophils Relative: 2 %
HEMATOCRIT: 31.1 % — AB (ref 39.0–52.0)
HEMOGLOBIN: 9.3 g/dL — AB (ref 13.0–17.0)
LYMPHS ABS: 1.3 10*3/uL (ref 0.7–4.0)
LYMPHS PCT: 12 %
MCH: 24.4 pg — AB (ref 26.0–34.0)
MCHC: 29.9 g/dL — AB (ref 30.0–36.0)
MCV: 81.6 fL (ref 78.0–100.0)
MONO ABS: 1 10*3/uL (ref 0.1–1.0)
MONOS PCT: 9 %
NEUTROS ABS: 8.8 10*3/uL — AB (ref 1.7–7.7)
NEUTROS PCT: 77 %
Platelets: 399 10*3/uL (ref 150–400)
RBC: 3.81 MIL/uL — ABNORMAL LOW (ref 4.22–5.81)
RDW: 15.4 % (ref 11.5–15.5)
WBC: 11.3 10*3/uL — ABNORMAL HIGH (ref 4.0–10.5)

## 2017-05-04 LAB — GLUCOSE, CAPILLARY: GLUCOSE-CAPILLARY: 94 mg/dL (ref 65–99)

## 2017-05-04 LAB — URINALYSIS, MICROSCOPIC (REFLEX)

## 2017-05-04 LAB — I-STAT CG4 LACTIC ACID, ED: LACTIC ACID, VENOUS: 0.49 mmol/L — AB (ref 0.5–1.9)

## 2017-05-04 LAB — MRSA PCR SCREENING: MRSA by PCR: POSITIVE — AB

## 2017-05-04 MED ORDER — METRONIDAZOLE IN NACL 5-0.79 MG/ML-% IV SOLN
500.0000 mg | Freq: Three times a day (TID) | INTRAVENOUS | Status: DC
  Administered 2017-05-04 – 2017-05-05 (×2): 500 mg via INTRAVENOUS
  Filled 2017-05-04 (×4): qty 100

## 2017-05-04 MED ORDER — ENOXAPARIN SODIUM 40 MG/0.4ML ~~LOC~~ SOLN
40.0000 mg | SUBCUTANEOUS | Status: DC
  Administered 2017-05-04 – 2017-05-06 (×3): 40 mg via SUBCUTANEOUS
  Filled 2017-05-04 (×3): qty 0.4

## 2017-05-04 MED ORDER — ONDANSETRON HCL 4 MG/2ML IJ SOLN
4.0000 mg | Freq: Once | INTRAMUSCULAR | Status: AC
  Administered 2017-05-04: 4 mg via INTRAVENOUS
  Filled 2017-05-04: qty 2

## 2017-05-04 MED ORDER — ADULT MULTIVITAMIN W/MINERALS CH
1.0000 | ORAL_TABLET | Freq: Every morning | ORAL | Status: DC
  Administered 2017-05-05 – 2017-05-07 (×3): 1 via ORAL
  Filled 2017-05-04 (×3): qty 1

## 2017-05-04 MED ORDER — ACETAMINOPHEN 325 MG PO TABS
650.0000 mg | ORAL_TABLET | Freq: Once | ORAL | Status: AC
  Administered 2017-05-04: 650 mg via ORAL
  Filled 2017-05-04: qty 2

## 2017-05-04 MED ORDER — MAGNESIUM OXIDE 400 (241.3 MG) MG PO TABS: 400.0000 mg | ORAL_TABLET | Freq: Every day | ORAL | Status: DC

## 2017-05-04 MED ORDER — METOCLOPRAMIDE HCL 5 MG/ML IJ SOLN
10.0000 mg | Freq: Four times a day (QID) | INTRAMUSCULAR | Status: DC
  Administered 2017-05-04 – 2017-05-05 (×3): 10 mg via INTRAVENOUS
  Filled 2017-05-04 (×3): qty 2

## 2017-05-04 MED ORDER — SODIUM CHLORIDE 0.9% FLUSH: 10.0000 mL | INTRAVENOUS | Status: DC | PRN

## 2017-05-04 MED ORDER — VITAMIN C 500 MG PO TABS
500.0000 mg | ORAL_TABLET | Freq: Every morning | ORAL | Status: DC
  Administered 2017-05-05 – 2017-05-07 (×3): 500 mg via ORAL
  Filled 2017-05-04 (×3): qty 1

## 2017-05-04 MED ORDER — SODIUM CHLORIDE 0.9 % IV BOLUS (SEPSIS): 1000.0000 mL | Freq: Once | INTRAVENOUS | Status: DC

## 2017-05-04 MED ORDER — ZINC SULFATE 220 (50 ZN) MG PO CAPS
220.0000 mg | ORAL_CAPSULE | Freq: Every day | ORAL | Status: DC
  Administered 2017-05-05 – 2017-05-07 (×3): 220 mg via ORAL
  Filled 2017-05-04 (×4): qty 1

## 2017-05-04 MED ORDER — FERROUS SULFATE 325 (65 FE) MG PO TABS
325.0000 mg | ORAL_TABLET | Freq: Every day | ORAL | Status: DC
  Administered 2017-05-05 – 2017-05-07 (×3): 325 mg via ORAL
  Filled 2017-05-04 (×3): qty 1

## 2017-05-04 MED ORDER — SODIUM CHLORIDE 0.9 % IV SOLN
INTRAVENOUS | Status: DC
  Administered 2017-05-05: 14:00:00 via INTRAVENOUS

## 2017-05-04 MED ORDER — PIPERACILLIN-TAZOBACTAM 3.375 G IVPB
3.3750 g | Freq: Three times a day (TID) | INTRAVENOUS | Status: DC
  Administered 2017-05-05 – 2017-05-07 (×7): 3.375 g via INTRAVENOUS
  Filled 2017-05-04 (×9): qty 50

## 2017-05-04 MED ORDER — FESOTERODINE FUMARATE ER 8 MG PO TB24
8.0000 mg | ORAL_TABLET | Freq: Every day | ORAL | Status: DC
  Administered 2017-05-05 – 2017-05-07 (×3): 8 mg via ORAL
  Filled 2017-05-04 (×4): qty 1

## 2017-05-04 MED ORDER — BACLOFEN 10 MG PO TABS
20.0000 mg | ORAL_TABLET | Freq: Four times a day (QID) | ORAL | Status: DC
  Administered 2017-05-04 – 2017-05-07 (×12): 20 mg via ORAL
  Filled 2017-05-04 (×13): qty 2

## 2017-05-04 MED ORDER — FAMOTIDINE IN NACL 20-0.9 MG/50ML-% IV SOLN
20.0000 mg | Freq: Two times a day (BID) | INTRAVENOUS | Status: DC
  Administered 2017-05-04 – 2017-05-06 (×4): 20 mg via INTRAVENOUS
  Filled 2017-05-04 (×4): qty 50

## 2017-05-04 MED ORDER — ZINC 50 MG PO TABS: 50.0000 mg | ORAL_TABLET | Freq: Two times a day (BID) | ORAL | Status: DC

## 2017-05-04 MED ORDER — PIPERACILLIN-TAZOBACTAM 3.375 G IVPB 30 MIN
3.3750 g | Freq: Once | INTRAVENOUS | Status: AC
  Administered 2017-05-04: 3.375 g via INTRAVENOUS
  Filled 2017-05-04: qty 50

## 2017-05-04 MED ORDER — SUCRALFATE 1 G PO TABS
1.0000 g | ORAL_TABLET | Freq: Four times a day (QID) | ORAL | Status: DC
  Administered 2017-05-04 – 2017-05-07 (×12): 1 g via ORAL
  Filled 2017-05-04 (×12): qty 1

## 2017-05-04 NOTE — ED Notes (Signed)
EDP at bedside changing 22Fr. Suprapubic catheter.
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
## 2017-05-04 NOTE — Progress Notes (Signed)
Pharmacy Antibiotic Note  [name] [name] is a 51 y.o. male admitted on 05/04/2017 with sepsis due to UTI  Pharmacy has been consulted for Zosyn dosing.  Plan: Zosyn 3.375g IV q8h EI Follow c/s, clinical progression, renal function, LOT/de-escalation    Temp (24hrs), Avg:100 F (37.8 C), Min:99.4 F (37.4 C), Max:100.6 F (38.1 C)  Recent Labs  Lab 05/04/17 1116 05/04/17 1235  WBC 11.3*  --   CREATININE 0.70  --   LATICACIDVEN  --  0.49*    Estimated Creatinine Clearance: 132.8 mL/min (by C-G formula based on SCr of 0.7 mg/dL).    Allergies  Allergen Reactions  . Feraheme [Ferumoxytol] Other (See Comments)    SYNCOPE  . Ditropan [Oxybutynin] Other (See Comments)    Hallucinations   . Vancomycin Other (See Comments)    ARF 05-2016 -- affects kidneys Has patient had a PCN reaction causing immediate rash, facial/tongue/throat swelling, SOB or lightheadedness with hypotension: Unk Has patient had a PCN reaction causing severe rash involving mucus membranes or skin necrosis: Unk Has patient had a PCN reaction that required hospitalization: Unk Has patient had a PCN reaction occurring within the last 10 years: Unk If all of the above answers are "NO", then may proceed with Cephalosporin use.     Antimicrobials this admission: Zosyn 12/20 >>  Flagyl 12/20 >>   Dose adjustments this admission: n/a  Microbiology results: 12/20 BCx:  12/20 UCx:   12/20 CDiff:   12/20 MRSA PCR:   Thank you for allowing pharmacy to be a part of this patient's care.  Sendy Pluta D. Nathalia Wismer, PharmD, [place] Clinical Pharmacist [phone] 05/04/2017 6:07 PM

## 2017-05-04 NOTE — ED Notes (Signed)
Suprapubic catheter ordered from materials management.

## 2017-05-04 NOTE — Progress Notes (Signed)
Got a call from Microbiology that patient's MRSA PCR swab was positive, same reported to pt's RN [place], pt already on isolation for C-diff precaution. [place]-As[place], Wilba Mutz Efe

## 2017-05-04 NOTE — ED Notes (Signed)
On way to renal US

## 2017-05-04 NOTE — ED Notes (Signed)
ED Provider at bedside. 

## 2017-05-04 NOTE — ED Notes (Signed)
Unsuccessful attempt at giving report to 3W.   
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
## 2017-05-04 NOTE — Progress Notes (Signed)
Patient arrived to unit. Denies pain at this time. [facility]-Admission notified
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
## 2017-05-04 NOTE — ED Triage Notes (Signed)
Patient presents from home with complains of nausea and vomiting since 0100. EMS reports patient has vomited x2 since. Patient reports still eating and drinking fine. Patient reports low grade fever last night and did not take anything. Patient alert and oriented on arrival. Patient denies any pain or Shortness or breathe.

## 2017-05-04 NOTE — ED Notes (Signed)
Gave pt ice water, per Erline Levine - Therapist, sports.

## 2017-05-04 NOTE — ED Notes (Signed)
To XR

## 2017-05-05 DIAGNOSIS — D649 Anemia, unspecified: Secondary | ICD-10-CM

## 2017-05-05 DIAGNOSIS — R112 Nausea with vomiting, unspecified: Secondary | ICD-10-CM

## 2017-05-05 DIAGNOSIS — G8250 Quadriplegia, unspecified: Secondary | ICD-10-CM

## 2017-05-05 DIAGNOSIS — A419 Sepsis, unspecified organism: Secondary | ICD-10-CM

## 2017-05-05 LAB — BASIC METABOLIC PANEL WITH GFR
Anion gap: 5 (ref 5–15)
BUN: 22 mg/dL — ABNORMAL HIGH (ref 6–20)
CO2: 24 mmol/L (ref 22–32)
Calcium: 8.4 mg/dL — ABNORMAL LOW (ref 8.9–10.3)
Chloride: 109 mmol/L (ref 101–111)
Creatinine, Ser: 0.58 mg/dL — ABNORMAL LOW (ref 0.61–1.24)
GFR calc Af Amer: >60 mL/min
GFR calc non Af Amer: >60 mL/min
Glucose, Bld: 88 mg/dL (ref 65–99)
Potassium: 3.5 mmol/L (ref 3.5–5.1)
Sodium: 138 mmol/L (ref 135–145)

## 2017-05-05 LAB — CBC
HEMATOCRIT: 28 % — AB (ref 39.0–52.0)
HEMOGLOBIN: 8.4 g/dL — AB (ref 13.0–17.0)
MCH: 24.6 pg — ABNORMAL LOW (ref 26.0–34.0)
MCHC: 30 g/dL (ref 30.0–36.0)
MCV: 82.1 fL (ref 78.0–100.0)
Platelets: 330 10*3/uL (ref 150–400)
RBC: 3.41 MIL/uL — AB (ref 4.22–5.81)
RDW: 15.2 % (ref 11.5–15.5)
WBC: 6.4 10*3/uL (ref 4.0–10.5)

## 2017-05-05 LAB — C-REACTIVE PROTEIN: CRP: 9.8 mg/dL — AB (ref <1.0)

## 2017-05-05 LAB — LACTIC ACID, PLASMA: LACTIC ACID, VENOUS: 0.6 mmol/L (ref 0.5–1.9)

## 2017-05-05 LAB — GLUCOSE, CAPILLARY: GLUCOSE-CAPILLARY: 93 mg/dL (ref 65–99)

## 2017-05-05 LAB — SEDIMENTATION RATE: Sed Rate: 90 mm/hr — ABNORMAL HIGH (ref 0–16)

## 2017-05-05 LAB — MAGNESIUM: Magnesium: 1.4 mg/dL — ABNORMAL LOW (ref 1.7–2.4)

## 2017-05-05 MED ORDER — MUPIROCIN 2 % EX OINT
1.0000 "application " | TOPICAL_OINTMENT | Freq: Two times a day (BID) | CUTANEOUS | Status: DC
  Administered 2017-05-05 – 2017-05-07 (×4): 1 via NASAL
  Filled 2017-05-05 (×2): qty 22

## 2017-05-05 MED ORDER — METOCLOPRAMIDE HCL 10 MG PO TABS
10.0000 mg | ORAL_TABLET | Freq: Four times a day (QID) | ORAL | Status: DC
  Administered 2017-05-05 – 2017-05-07 (×7): 10 mg via ORAL
  Filled 2017-05-05 (×7): qty 1

## 2017-05-05 MED ORDER — PRO-STAT SUGAR FREE PO LIQD
30.0000 mL | Freq: Two times a day (BID) | ORAL | Status: DC
  Administered 2017-05-05 – 2017-05-07 (×5): 30 mL via ORAL
  Filled 2017-05-05 (×5): qty 30

## 2017-05-05 MED ORDER — CHLORHEXIDINE GLUCONATE CLOTH 2 % EX PADS
6.0000 | MEDICATED_PAD | Freq: Every day | CUTANEOUS | Status: DC
  Administered 2017-05-05 – 2017-05-06 (×2): 6 via TOPICAL

## 2017-05-05 MED ORDER — COLLAGENASE 250 UNIT/GM EX OINT
TOPICAL_OINTMENT | Freq: Every day | CUTANEOUS | Status: DC
  Administered 2017-05-05 – 2017-05-07 (×3): via TOPICAL
  Filled 2017-05-05: qty 30

## 2017-05-05 NOTE — Consult Note (Signed)
[facility] Nurse wound consult note Reason for Consult: multiple pressure injuries and wounds Patient well known to the [facility] nursing service. Last seen in October 2018.  Patient with known chronic osteomyelitis, no acute changes noted at this admission  Wound type: Stage 4 pressure injuries in various stages of healing. Large area of deformity that extends over the entire sacrum/ischial region 4 distinct open areas; it should be noted while the areas are chronic and some healing has occurred that they will still be listed as Stage 4 because we can not back stage from previous staging system  Stage 4 pressure injury left ischium: 11cm x 5cm x 0.2cm: 100% pale, clean, pink, non granular Stage 4 pressure injury left ischium; 2cm x 4cm x 0.1cm: 100% pale, clean, pink, non granular Stage 4 pressure injury right ischium: 1.5cm x 3cm x 6cm: unable to visualize wound bed  Stage 4 pressure injury sacrum; 11cm x 2cm x 0.1cm: 100% pale, clean, pink, non granular  Full thickness skin loss 2nd finger: 1.0cm x 3.0cm x 0.2cm; 100% yellow/green slough material Partial thickness skin loss 3rd finger; 1cm x 1cm x 0.1cm: 100% clean, pink, dry Full thickness skin loss secondary to trauma from ill fitting shoes: 2.5cm x 2.0cm x 0.1cm: 100% pink, dry clean  Unstageable pressure injury: right lateral chest: 11cm x 5cm x 0.2cm; 80% black/grey non viable tissue/ 20% pink Pressure Injury POA: Yes x 5 Measurement: see above  Wound bed: see above  Drainage (amount, consistency, odor) sacral and ischial wounds are clean and have heavy, serosanguinous drainage consistent with previous admissions. Not surprised with chronic osteomyelitis.  Periwound: intact with evidence of previous skin breakdown  Dressing procedure/placement/frequency: Alginate to absorb exudate on the sacral and ischial wounds, they do not appear infected or contaminated with bioburdan. Silver hydrofiber to the 3rd finger and 4th finger wound due to the appearance  of the drainage today.   Enzymatic debridement ointment to the right lateral chest wound to clean up nonviable tissue.  LALM in place for moisture management and pressure redistribution  Foam to protect the skin loss on the right dorsal foot  SP and diverting ostomy in place to prevent contamination of the wounds.    Diverting ostomy in place, ostomy supplies in the room for nursing use.  Patient is completely dependent in the care of his ostomy.  [facility] Nurse team will follow along with you for weekly wound assessments.  Please notify me of any acute changes in the wounds or any new areas of concerns [place] MSN, RN,CWOCN, [place], [employer]

## 2017-05-05 NOTE — Progress Notes (Signed)
Initial Nutrition Assessment  DOCUMENTATION CODES:   Not applicable  INTERVENTION:    Pro-stat 30 ml BID, each supplement provides 100 kcal and 15 gm protein  MVI daily  NUTRITION DIAGNOSIS:   Increased nutrient needs related to wound healing as evidenced by estimated needs.  GOAL:   Patient will meet greater than or equal to 90% of their needs  MONITOR:   PO intake, Supplement acceptance, Skin  REASON FOR ASSESSMENT:   Low Braden    ASSESSMENT:   51 yo male with PMH of multiple chronic wounds including a stage IV decubitus ulcer, HTN, quadriplegia d/t C5 fx, osteomyelitis, morbid obesity, GERD, OSA, seizures, colostomy who was admitted on 12/20 with sepsis 2/2 UTI.  Patient reports good intake PTA and now. He is able to feed himself and has a great appetite. Discussed ways to ensure adequate protein in diet to help promote wound healing. He agreed to take Pro-stat BID to maximize protein intake. Labs and medications reviewed.  NUTRITION - FOCUSED PHYSICAL EXAM:    Most Recent Value  Orbital Region  No depletion  Upper Arm Region  No depletion  Thoracic and Lumbar Region  No depletion  Buccal Region  No depletion  Hair  Reviewed  Eyes  Reviewed  Mouth  Reviewed  Skin  Reviewed  Nails  Reviewed       Diet Order:  Diet regular Room service appropriate? Yes; Fluid consistency: Thin  EDUCATION NEEDS:   Education needs have been addressed(discussed ways to increase protein intake)  Skin:  Skin Assessment: Skin Integrity Issues: Skin Integrity Issues:: Stage IV, Other (Comment) Stage IV: sacrum Other: non pressure wound to R foot; tunneling wound to R ischial tuberosity; open wound to RU back  Last BM:  12/21  Height:   Ht Readings from Last 1 Encounters:  05/03/17 6' (1.829 m)    Weight:   Wt Readings from Last 1 Encounters:  05/03/17 212 lb (96.2 kg)    Ideal Body Weight:  80.9 kg  BMI:  28.8  Estimated Nutritional Needs:   Kcal:   2100-2300  Protein:  125-140 gm  Fluid:  >/= 2.3 L    Molli Barrows, RD, LDN, [place] Pager [phone] After Hours Pager [phone]

## 2017-05-05 NOTE — Progress Notes (Signed)
PROGRESS NOTE    [name] [name]  GYF:[id number] DOB: [birth year] DOA: 05/04/2017 PCP: [name], MD   Chief Complaint  Patient presents with  . Nausea  . Emesis    Brief Narrative:  HPI on 05/04/2017 by Dr. Florencia Reasons [name] [name] is a 51 y.o. 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 He denies abdominal pain, he report watery stool output from colostomy bag for about a month now. No cough, no sob, no chest pain, no lower extremity edema.  Assessment & Plan   Sepsis secondary to UTI -patient does have suprapubic catheter in place, recently changed in the ED -presented with tachycardia and fever  -Urine culture >100GNR -Blood cultures pending -Cdiff negative -Renal US unremarkable -CXR unremarkable for infection -Patient was started on zosyn and flagyl (for possible CDiff-vanc allergy)- will discontinue flagyl  -CT abd/pelvis showed chronic osteo- no acute findings  Nausea/Vomiting -improving -continue antiemetics PRN -patient able to tolerate diet, will transition back to oral medications -CT abd/pelvis (renal stone): unremarkable for acute abnormality   Chronic wounds -sees wound care and was told wounds are doing "ok" -CT showed chronic osteomyelitis/infection could not be exluded  Normocytic anemia -hemoglobin at baseline ranges from 8-10 -Currently 8.4, will continue to monitor CBC -continue iron supplementation   GERD -continue sucralafate, PPI   Quadriplegia with indwelling suprapubic catheter/colostomy -Continue toviaz and baclofen  OSA -continue CPAP QHS   DVT Prophylaxis  lovenox  Code Status: Full  Family Communication: None at bedside  Disposition Plan: Admitted, pending improvement and culture results  Consultants None  Procedures  Renal  US  Antibiotics   Anti-infectives (From admission, onward)   Start     Dose/Rate Route Frequency Ordered Stop   05/04/17 1830  piperacillin-tazobactam (ZOSYN) IVPB 3.375 g     3.375 g 12.5 mL/hr over 240 Minutes Intravenous Every 8 hours 05/04/17 1806     05/04/17 1800  metroNIDAZOLE (FLAGYL) IVPB 500 mg  Status:  Discontinued     500 mg 100 mL/hr over 60 Minutes Intravenous Every 8 hours 05/04/17 1736 05/05/17 1217   05/04/17 1300  piperacillin-tazobactam (ZOSYN) IVPB 3.375 g     3.375 g 100 mL/hr over 30 Minutes Intravenous  Once 05/04/17 1248 05/04/17 1353      Subjective:   [name] [name] seen and examined today.  Feels nausea and vomiting have improved. Able to tolerate breakfast. Denies current chest pain, shortness of breath, abdominal pain, dizziness, headache.   Objective:   Vitals:   05/04/17 1647 05/04/17 2141 05/05/17 0001 05/05/17 0616  BP: (!) 86/57 98/66 (!) 131/108 118/80  Pulse: (!) 115 (!) 109 95 96  Resp: 20 20 20 20   Temp: (!) 100.6 F (38.1 C) 98.8 F (37.1 C) 98.6 F (37 C) (!) 97.3 F (36.3 C)  TempSrc: Oral Oral Oral Oral  SpO2: 100% 100% 97% 100%    Intake/Output Summary (Last 24 hours) at 05/05/2017 1248 Last data filed at 05/05/2017 9163 Gross per 24 hour  Intake 50 ml  Output 1100 ml  Net -1050 ml   There were no vitals filed for this visit.  Exam  General: Well developed, chronically ill appearing, NAD  HEENT: NCAT, mucous membranes moist.   Cardiovascular: S1 S2 auscultated, no rubs, murmurs or gallops. Regular rate and rhythm.  Respiratory: Clear to auscultation bilaterally with  equal chest rise  Abdomen: Soft, obese, nontender, distended, + bowel sounds, +colostomy, suprapubic catheter  Extremities: warm dry without cyanosis clubbing. Chronic contractures of upper/lower ext  Neuro: AAOx3, quadriplegic-able to move upper ext some,  otherwise nonfocal  Psych: Normal affect and demeanor with intact judgement and insight   Data  Reviewed: I have personally reviewed following labs and imaging studies  CBC: Recent Labs  Lab 05/04/17 1116 05/05/17 0418  WBC 11.3* 6.4  NEUTROABS 8.8*  --   HGB 9.3* 8.4*  HCT 31.1* 28.0*  MCV 81.6 82.1  PLT 399 539   Basic Metabolic Panel: Recent Labs  Lab 05/04/17 1116 05/05/17 0418  NA 138 138  K 4.0 3.5  CL 108 109  CO2 23 24  GLUCOSE 108* 88  BUN 23* 22*  CREATININE 0.70 0.58*  CALCIUM 8.6* 8.4*  MG  --  1.4*   GFR: Estimated Creatinine Clearance: 132.8 mL/min (A) (by C-G formula based on SCr of 0.58 mg/dL (L)). Liver Function Tests: Recent Labs  Lab 05/04/17 1116  AST 31  ALT 37  ALKPHOS 78  BILITOT 0.4  PROT 8.2*  ALBUMIN 2.8*   No results for input(s): LIPASE, AMYLASE in the last 168 hours. No results for input(s): AMMONIA in the last 168 hours. Coagulation Profile: No results for input(s): INR, PROTIME in the last 168 hours. Cardiac Enzymes: No results for input(s): CKTOTAL, CKMB, CKMBINDEX, TROPONINI in the last 168 hours. BNP (last 3 results) No results for input(s): PROBNP in the last 8760 hours. HbA1C: No results for input(s): HGBA1C in the last 72 hours. CBG: Recent Labs  Lab 05/04/17 2137 05/05/17 1129  GLUCAP 94 93   Lipid Profile: No results for input(s): CHOL, HDL, LDLCALC, TRIG, CHOLHDL, LDLDIRECT in the last 72 hours. Thyroid Function Tests: No results for input(s): TSH, T4TOTAL, FREET4, T3FREE, THYROIDAB in the last 72 hours. Anemia Panel: No results for input(s): VITAMINB12, FOLATE, FERRITIN, TIBC, IRON, RETICCTPCT in the last 72 hours. Urine analysis:    Component Value Date/Time   COLORURINE YELLOW 05/04/2017 1121   APPEARANCEUR CLOUDY (A) 05/04/2017 1121   LABSPEC 1.015 05/04/2017 1121   PHURINE 6.0 05/04/2017 1121   GLUCOSEU NEGATIVE 05/04/2017 1121   HGBUR SMALL (A) 05/04/2017 1121   BILIRUBINUR NEGATIVE 05/04/2017 1121   KETONESUR NEGATIVE 05/04/2017 1121   PROTEINUR NEGATIVE 05/04/2017 1121   UROBILINOGEN 1.0  03/16/2015 0835   NITRITE POSITIVE (A) 05/04/2017 1121   LEUKOCYTESUR LARGE (A) 05/04/2017 1121   Sepsis Labs: @LABRCNTIP (procalcitonin:4,lacticidven:4)  ) Recent Results (from the past 240 hour(s))  Urine culture     Status: Abnormal (Preliminary result)   Collection Time: 05/04/17 11:16 AM  Result Value Ref Range Status   Specimen Description URINE, SUPRAPUBIC  Final   Special Requests NONE  Final   Culture (A)  Final    >=100,000 COLONIES/mL GRAM NEGATIVE RODS CULTURE REINCUBATED FOR BETTER GROWTH    Report Status PENDING  Incomplete  C difficile quick scan w PCR reflex     Status: None   Collection Time: 05/04/17  5:25 PM  Result Value Ref Range Status   C Diff antigen NEGATIVE NEGATIVE Final   C Diff toxin NEGATIVE NEGATIVE Final   C Diff interpretation No C. difficile detected.  Final  MRSA PCR Screening     Status: Abnormal   Collection Time: 05/04/17  6:06 PM  Result Value Ref Range Status   MRSA by PCR POSITIVE (A) NEGATIVE Final    Comment:  The GeneXpert MRSA Assay (FDA approved for NASAL specimens only), is one component of a comprehensive MRSA colonization surveillance program. It is not intended to diagnose MRSA infection nor to guide or monitor treatment for MRSA infections. RESULT CALLED TO, READ BACK BY AND VERIFIED WITH: Viviana Simpler RN 2312 05/04/17 A BROWNING       Radiology Studies: Dg Chest 2 View  Result Date: 05/04/2017 CLINICAL DATA:  Patient with fever, nausea. EXAM: CHEST  2 VIEW COMPARISON:  Chest radiograph 04/19/2017. FINDINGS: Right anterior chest wall Port-A-Cath is present with tip projecting over the superior vena cava. Patient is rotated. Stable cardiomegaly. Low lung volumes. Basilar atelectasis. No large area pulmonary consolidation. No pleural effusion or pneumothorax. Lateral view is nondiagnostic. IMPRESSION: No acute cardiopulmonary process. Basilar atelectasis. Electronically Signed   By: Lovey Newcomer M.D.   On: 05/04/2017  12:12   US Renal  Result Date: 05/04/2017 CLINICAL DATA:  Fever and vomiting. EXAM: RENAL / URINARY TRACT ULTRASOUND COMPLETE COMPARISON:  CT abdomen pelvis dated February 05, 2017. FINDINGS: Right Kidney: Length: 11.0 cm. Echogenicity within normal limits. No mass or hydronephrosis visualized. Left Kidney: Length: 11.6 cm. Echogenicity within normal limits. No mass or hydronephrosis visualized. Small 1.1 cm simple cyst in the midpole, grossly unchanged when compared to prior CT. Bladder: Decompressed by Foley catheter. IMPRESSION: No acute abnormality. Electronically Signed   By: Titus Dubin M.D.   On: 05/04/2017 14:37   Ct Renal Stone Study  Result Date: 05/04/2017 CLINICAL DATA:  51 year old male with acute abdominal pain and nausea and vomiting. EXAM: CT ABDOMEN AND PELVIS WITHOUT CONTRAST TECHNIQUE: Multidetector CT imaging of the abdomen and pelvis was performed following the standard protocol without IV contrast. COMPARISON:  05/04/2017 ultrasound, 02/05/2017 CT and prior studies FINDINGS: Please note that parenchymal abnormalities may be missed without intravenous contrast. Lower chest: Mild bibasilar atelectasis/scarring again noted. Mild cardiomegaly identified. Hepatobiliary: The liver and gallbladder are unremarkable. No definite biliary dilatation. Pancreas: Unremarkable Spleen: Unremarkable Adrenals/Urinary Tract: The kidneys are unchanged without acute abnormality. No evidence of hydronephrosis or urinary calculi. A suprapubic catheter within the bladder is again noted. The adrenal glands are unremarkable. Stomach/Bowel: Gastric distension is unchanged. There is no evidence of small bowel obstruction. No definite bowel wall thickening or inflammation noted. Vascular/Lymphatic: No significant vascular findings are present. No enlarged abdominal or pelvic lymph nodes. Reproductive: Unremarkable Other: No ascites, pneumoperitoneum or focal collection. Musculoskeletal: Severe chronic  changes of the pelvis and hips again noted and unchanged. Right pelvic/hip ulceration and soft tissue is also unchanged. No acute bony abnormalities are identified. IMPRESSION: 1. No evidence of acute abnormality 2. Unchanged chronic pelvic and hip changes. Chronic osteomyelitis/infection is not excluded. 3. Mild bibasilar atelectasis/scarring. Electronically Signed   By: Margarette Canada M.D.   On: 05/04/2017 16:26     Scheduled Meds: . baclofen  20 mg Oral QID  . Chlorhexidine Gluconate Cloth  6 each Topical Q0600  . enoxaparin (LOVENOX) injection  40 mg Subcutaneous Q24H  . feeding supplement (PRO-STAT SUGAR FREE 64)  30 mL Oral BID  . ferrous sulfate  325 mg Oral Q breakfast  . fesoterodine  8 mg Oral Daily  . metoCLOPramide (REGLAN) injection  10 mg Intravenous Q6H  . multivitamin with minerals  1 tablet Oral q morning - 10a  . mupirocin ointment  1 application Nasal BID  . sucralfate  1 g Oral QID  . vitamin C  500 mg Oral q morning - 10a  . zinc sulfate  220 mg Oral Daily   Continuous Infusions: . sodium chloride    . famotidine (PEPCID) IV Stopped (05/05/17 2224)  . piperacillin-tazobactam (ZOSYN)  IV Stopped (05/05/17 0919)  . sodium chloride       LOS: 1 day   Time Spent in minutes   30 minutes  Tarika Mckethan D.O. on 05/05/2017 at 12:48 PM  Between 7am to 7pm - Pager - [phone]  After 7pm go to [email or web address] - password [email or web address]  And look for the night coverage person covering for me after hours  [facility] Office  [phone]

## 2017-05-06 LAB — BASIC METABOLIC PANEL
ANION GAP: 5 (ref 5–15)
BUN: 15 mg/dL (ref 6–20)
CHLORIDE: 110 mmol/L (ref 101–111)
CO2: 23 mmol/L (ref 22–32)
Calcium: 8.4 mg/dL — ABNORMAL LOW (ref 8.9–10.3)
Creatinine, Ser: 0.47 mg/dL — ABNORMAL LOW (ref 0.61–1.24)
Glucose, Bld: 112 mg/dL — ABNORMAL HIGH (ref 65–99)
POTASSIUM: 3.4 mmol/L — AB (ref 3.5–5.1)
SODIUM: 138 mmol/L (ref 135–145)

## 2017-05-06 LAB — CBC
HCT: 26.9 % — ABNORMAL LOW (ref 39.0–52.0)
HEMOGLOBIN: 7.8 g/dL — AB (ref 13.0–17.0)
MCH: 24 pg — AB (ref 26.0–34.0)
MCHC: 29 g/dL — ABNORMAL LOW (ref 30.0–36.0)
MCV: 82.8 fL (ref 78.0–100.0)
PLATELETS: 341 10*3/uL (ref 150–400)
RBC: 3.25 MIL/uL — AB (ref 4.22–5.81)
RDW: 15.2 % (ref 11.5–15.5)
WBC: 6.2 10*3/uL (ref 4.0–10.5)

## 2017-05-06 MED ORDER — POTASSIUM CHLORIDE CRYS ER 20 MEQ PO TBCR
40.0000 meq | EXTENDED_RELEASE_TABLET | Freq: Once | ORAL | Status: AC
  Administered 2017-05-06: 40 meq via ORAL
  Filled 2017-05-06: qty 2

## 2017-05-06 MED ORDER — FAMOTIDINE 20 MG PO TABS
20.0000 mg | ORAL_TABLET | Freq: Two times a day (BID) | ORAL | Status: DC
  Administered 2017-05-06 – 2017-05-07 (×2): 20 mg via ORAL
  Filled 2017-05-06 (×2): qty 1

## 2017-05-06 MED ORDER — MAGNESIUM SULFATE 2 GM/50ML IV SOLN
2.0000 g | Freq: Once | INTRAVENOUS | Status: AC
  Administered 2017-05-06: 2 g via INTRAVENOUS
  Filled 2017-05-06: qty 50

## 2017-05-06 NOTE — Progress Notes (Signed)
PROGRESS NOTE    [name] [name]  KYH:[id number] DOB: [birth year] DOA: 05/04/2017 PCP: [name], MD   Chief Complaint  Patient presents with  . Nausea  . Emesis    Brief Narrative:  [name] [name] is a 51 y.o. 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 Admitted for sepsis secondary to UTI. Pending cultures.   Assessment & Plan   Sepsis secondary to UTI -patient does have suprapubic catheter in place, recently changed in the ED -presented with tachycardia and fever  -Urine culture >100GNR: Ecoli, proteus mirabilis, pseudonas aeruginosa. Pending susceptibles  -Blood cultures no growth to date -Cdiff negative -Renal US unremarkable -CXR unremarkable for infection -Patient was started on zosyn and flagyl (for possible CDiff-vanc allergy)- will discontinue flagyl  -CT abd/pelvis showed chronic osteo- no acute findings  Nausea/Vomiting -improving -continue antiemetics PRN -patient able to tolerate diet, will transition back to oral medications -CT abd/pelvis (renal stone): unremarkable for acute abnormality   Chronic wounds -sees wound care and was told wounds are doing "ok" -CT showed chronic osteomyelitis/infection could not be exluded -wound care consulted and appreciated  Normocytic anemia -hemoglobin at baseline ranges from 8-10 -Currently 7.8, will continue to monitor CBC -continue iron supplementation   GERD -continue sucralafate, PPI   Quadriplegia with indwelling suprapubic catheter/colostomy -Continue toviaz and baclofen  OSA -continue CPAP QHS   Hypokalemia -will replace and continue to monitor BMP  Hypomagnesemia -will replace and continue to monitor   DVT Prophylaxis  lovenox  Code Status: Full  Family Communication: None at bedside  Disposition Plan: Admitted, pending improvement and culture  results  Consultants None  Procedures  Renal US  Antibiotics   Anti-infectives (From admission, onward)   Start     Dose/Rate Route Frequency Ordered Stop   05/04/17 1830  piperacillin-tazobactam (ZOSYN) IVPB 3.375 g     3.375 g 12.5 mL/hr over 240 Minutes Intravenous Every 8 hours 05/04/17 1806     05/04/17 1800  metroNIDAZOLE (FLAGYL) IVPB 500 mg  Status:  Discontinued     500 mg 100 mL/hr over 60 Minutes Intravenous Every 8 hours 05/04/17 1736 05/05/17 1217   05/04/17 1300  piperacillin-tazobactam (ZOSYN) IVPB 3.375 g     3.375 g 100 mL/hr over 30 Minutes Intravenous  Once 05/04/17 1248 05/04/17 1353      Subjective:   [name] [name] seen and examined today.  Patient feels the nausea and vomiting have resolved. Able to tolerate food. Denies any current abdominal pain, chest pain, shortness breath, abdominal pain, dizziness or headache. Does admit to his urine having a bad smell.  Objective:   Vitals:   05/05/17 2121 05/06/17 0057 05/06/17 0500 05/06/17 0842  BP: (!) 120/106 112/64 117/85 117/75  Pulse: 95 82 97 85  Resp: 18 18 18    Temp: 99.9 F (37.7 C) 99.9 F (37.7 C) 98.1 F (36.7 C) 97.7 F (36.5 C)  TempSrc: Oral Oral Oral Oral  SpO2: 98% 96% 98% 98%    Intake/Output Summary (Last 24 hours) at 05/06/2017 1245 Last data filed at 05/06/2017 1126 Gross per 24 hour  Intake 2706.25 ml  Output 1250 ml  Net 1456.25 ml   There were no vitals filed for this visit.  Exam (no change from previous day 05/05/2017)  General: Well developed, chronically ill appearing, NAD  HEENT: NCAT, mucous membranes moist.   Cardiovascular: S1 S2 auscultated, no  rubs, murmurs or gallops. Regular rate and rhythm.  Respiratory: Clear to auscultation bilaterally with equal chest rise  Abdomen: Soft, obese, nontender, distended, + bowel sounds, +colostomy, suprapubic catheter  Extremities: warm dry without cyanosis clubbing. Chronic contractures of upper/lower ext  Neuro: AAOx3,  quadriplegic-able to move upper ext some,  otherwise nonfocal  Psych: Normal affect and demeanor with intact judgement and insight   Data Reviewed: I have personally reviewed following labs and imaging studies  CBC: Recent Labs  Lab 05/04/17 1116 05/05/17 0418 05/06/17 0445  WBC 11.3* 6.4 6.2  NEUTROABS 8.8*  --   --   HGB 9.3* 8.4* 7.8*  HCT 31.1* 28.0* 26.9*  MCV 81.6 82.1 82.8  PLT 399 330 469   Basic Metabolic Panel: Recent Labs  Lab 05/04/17 1116 05/05/17 0418 05/06/17 0445  NA 138 138 138  K 4.0 3.5 3.4*  CL 108 109 110  CO2 23 24 23   GLUCOSE 108* 88 112*  BUN 23* 22* 15  CREATININE 0.70 0.58* 0.47*  CALCIUM 8.6* 8.4* 8.4*  MG  --  1.4*  --    GFR: Estimated Creatinine Clearance: 132.8 mL/min (A) (by C-G formula based on SCr of 0.47 mg/dL (L)). Liver Function Tests: Recent Labs  Lab 05/04/17 1116  AST 31  ALT 37  ALKPHOS 78  BILITOT 0.4  PROT 8.2*  ALBUMIN 2.8*   No results for input(s): LIPASE, AMYLASE in the last 168 hours. No results for input(s): AMMONIA in the last 168 hours. Coagulation Profile: No results for input(s): INR, PROTIME in the last 168 hours. Cardiac Enzymes: No results for input(s): CKTOTAL, CKMB, CKMBINDEX, TROPONINI in the last 168 hours. BNP (last 3 results) No results for input(s): PROBNP in the last 8760 hours. HbA1C: No results for input(s): HGBA1C in the last 72 hours. CBG: Recent Labs  Lab 05/04/17 2137 05/05/17 1129  GLUCAP 94 93   Lipid Profile: No results for input(s): CHOL, HDL, LDLCALC, TRIG, CHOLHDL, LDLDIRECT in the last 72 hours. Thyroid Function Tests: No results for input(s): TSH, T4TOTAL, FREET4, T3FREE, THYROIDAB in the last 72 hours. Anemia Panel: No results for input(s): VITAMINB12, FOLATE, FERRITIN, TIBC, IRON, RETICCTPCT in the last 72 hours. Urine analysis:    Component Value Date/Time   COLORURINE YELLOW 05/04/2017 1121   APPEARANCEUR CLOUDY (A) 05/04/2017 1121   LABSPEC 1.015 05/04/2017  1121   PHURINE 6.0 05/04/2017 1121   GLUCOSEU NEGATIVE 05/04/2017 1121   HGBUR SMALL (A) 05/04/2017 1121   BILIRUBINUR NEGATIVE 05/04/2017 1121   KETONESUR NEGATIVE 05/04/2017 1121   PROTEINUR NEGATIVE 05/04/2017 1121   UROBILINOGEN 1.0 03/16/2015 0835   NITRITE POSITIVE (A) 05/04/2017 1121   LEUKOCYTESUR LARGE (A) 05/04/2017 1121   Sepsis Labs: @LABRCNTIP (procalcitonin:4,lacticidven:4)  ) Recent Results (from the past 240 hour(s))  Urine culture     Status: Abnormal (Preliminary result)   Collection Time: 05/04/17 11:16 AM  Result Value Ref Range Status   Specimen Description URINE, SUPRAPUBIC  Final   Special Requests NONE  Final   Culture (A)  Final    >=100,000 COLONIES/mL ESCHERICHIA COLI >=100,000 COLONIES/mL PROTEUS MIRABILIS >=100,000 COLONIES/mL PSEUDOMONAS AERUGINOSA SUSCEPTIBILITIES TO FOLLOW    Report Status PENDING  Incomplete  Culture, blood (routine x 2)     Status: None (Preliminary result)   Collection Time: 05/04/17 11:21 AM  Result Value Ref Range Status   Specimen Description BLOOD PORTA CATH  Final   Special Requests   Final    BOTTLES DRAWN AEROBIC AND ANAEROBIC Blood Culture  adequate volume   Culture NO GROWTH 1 DAY  Final   Report Status PENDING  Incomplete  Culture, blood (routine x 2)     Status: None (Preliminary result)   Collection Time: 05/04/17 11:37 AM  Result Value Ref Range Status   Specimen Description BLOOD RIGHT HAND  Final   Special Requests IN PEDIATRIC BOTTLE Blood Culture adequate volume  Final   Culture NO GROWTH 1 DAY  Final   Report Status PENDING  Incomplete  C difficile quick scan w PCR reflex     Status: None   Collection Time: 05/04/17  5:25 PM  Result Value Ref Range Status   C Diff antigen NEGATIVE NEGATIVE Final   C Diff toxin NEGATIVE NEGATIVE Final   C Diff interpretation No C. difficile detected.  Final  MRSA PCR Screening     Status: Abnormal   Collection Time: 05/04/17  6:06 PM  Result Value Ref Range Status    MRSA by PCR POSITIVE (A) NEGATIVE Final    Comment:        The GeneXpert MRSA Assay (FDA approved for NASAL specimens only), is one component of a comprehensive MRSA colonization surveillance program. It is not intended to diagnose MRSA infection nor to guide or monitor treatment for MRSA infections. RESULT CALLED TO, READ BACK BY AND VERIFIED WITH: Viviana Simpler RN 2312 05/04/17 A BROWNING       Radiology Studies: US Renal  Result Date: 05/04/2017 CLINICAL DATA:  Fever and vomiting. EXAM: RENAL / URINARY TRACT ULTRASOUND COMPLETE COMPARISON:  CT abdomen pelvis dated February 05, 2017. FINDINGS: Right Kidney: Length: 11.0 cm. Echogenicity within normal limits. No mass or hydronephrosis visualized. Left Kidney: Length: 11.6 cm. Echogenicity within normal limits. No mass or hydronephrosis visualized. Small 1.1 cm simple cyst in the midpole, grossly unchanged when compared to prior CT. Bladder: Decompressed by Foley catheter. IMPRESSION: No acute abnormality. Electronically Signed   By: Titus Dubin M.D.   On: 05/04/2017 14:37   Ct Renal Stone Study  Result Date: 05/04/2017 CLINICAL DATA:  51 year old male with acute abdominal pain and nausea and vomiting. EXAM: CT ABDOMEN AND PELVIS WITHOUT CONTRAST TECHNIQUE: Multidetector CT imaging of the abdomen and pelvis was performed following the standard protocol without IV contrast. COMPARISON:  05/04/2017 ultrasound, 02/05/2017 CT and prior studies FINDINGS: Please note that parenchymal abnormalities may be missed without intravenous contrast. Lower chest: Mild bibasilar atelectasis/scarring again noted. Mild cardiomegaly identified. Hepatobiliary: The liver and gallbladder are unremarkable. No definite biliary dilatation. Pancreas: Unremarkable Spleen: Unremarkable Adrenals/Urinary Tract: The kidneys are unchanged without acute abnormality. No evidence of hydronephrosis or urinary calculi. A suprapubic catheter within the bladder is again noted. The  adrenal glands are unremarkable. Stomach/Bowel: Gastric distension is unchanged. There is no evidence of small bowel obstruction. No definite bowel wall thickening or inflammation noted. Vascular/Lymphatic: No significant vascular findings are present. No enlarged abdominal or pelvic lymph nodes. Reproductive: Unremarkable Other: No ascites, pneumoperitoneum or focal collection. Musculoskeletal: Severe chronic changes of the pelvis and hips again noted and unchanged. Right pelvic/hip ulceration and soft tissue is also unchanged. No acute bony abnormalities are identified. IMPRESSION: 1. No evidence of acute abnormality 2. Unchanged chronic pelvic and hip changes. Chronic osteomyelitis/infection is not excluded. 3. Mild bibasilar atelectasis/scarring. Electronically Signed   By: Margarette Canada M.D.   On: 05/04/2017 16:26     Scheduled Meds: . baclofen  20 mg Oral QID  . Chlorhexidine Gluconate Cloth  6 each Topical Q0600  . collagenase  Topical Daily  . enoxaparin (LOVENOX) injection  40 mg Subcutaneous Q24H  . famotidine  20 mg Oral BID  . feeding supplement (PRO-STAT SUGAR FREE 64)  30 mL Oral BID  . ferrous sulfate  325 mg Oral Q breakfast  . fesoterodine  8 mg Oral Daily  . metoCLOPramide  10 mg Oral Q6H  . multivitamin with minerals  1 tablet Oral q morning - 10a  . mupirocin ointment  1 application Nasal BID  . sucralfate  1 g Oral QID  . vitamin C  500 mg Oral q morning - 10a  . zinc sulfate  220 mg Oral Daily   Continuous Infusions: . sodium chloride 75 mL/hr at 05/05/17 1339  . piperacillin-tazobactam (ZOSYN)  IV Stopped (05/06/17 1000)  . sodium chloride       LOS: 2 days   Time Spent in minutes   30 minutes  Aanyah Loa D.O. on 05/06/2017 at 12:45 PM  Between 7am to 7pm - Pager - [phone]  After 7pm go to [email or web address] - password [email or web address]  And look for the night coverage person covering for me after hours  [facility] Office  [phone]

## 2017-05-07 DIAGNOSIS — Z9359 Other cystostomy status: Secondary | ICD-10-CM

## 2017-05-07 DIAGNOSIS — T83511A Infection and inflammatory reaction due to indwelling urethral catheter, initial encounter: Secondary | ICD-10-CM

## 2017-05-07 DIAGNOSIS — L89154 Pressure ulcer of sacral region, stage 4: Secondary | ICD-10-CM

## 2017-05-07 DIAGNOSIS — E43 Unspecified severe protein-calorie malnutrition: Secondary | ICD-10-CM

## 2017-05-07 DIAGNOSIS — N390 Urinary tract infection, site not specified: Secondary | ICD-10-CM

## 2017-05-07 LAB — URINE CULTURE: Culture: >=100000 — AB

## 2017-05-07 LAB — BASIC METABOLIC PANEL
Anion gap: 3 — ABNORMAL LOW (ref 5–15)
BUN: 16 mg/dL (ref 6–20)
CALCIUM: 8.4 mg/dL — AB (ref 8.9–10.3)
CO2: 25 mmol/L (ref 22–32)
CREATININE: 0.64 mg/dL (ref 0.61–1.24)
Chloride: 110 mmol/L (ref 101–111)
GFR calc Af Amer: >60 mL/min (ref >60)
Glucose, Bld: 111 mg/dL — ABNORMAL HIGH (ref 65–99)
POTASSIUM: 3.5 mmol/L (ref 3.5–5.1)
SODIUM: 138 mmol/L (ref 135–145)

## 2017-05-07 LAB — CBC
HCT: 26.2 % — ABNORMAL LOW (ref 39.0–52.0)
Hemoglobin: 7.7 g/dL — ABNORMAL LOW (ref 13.0–17.0)
MCH: 24.2 pg — AB (ref 26.0–34.0)
MCHC: 29.4 g/dL — ABNORMAL LOW (ref 30.0–36.0)
MCV: 82.4 fL (ref 78.0–100.0)
PLATELETS: 328 10*3/uL (ref 150–400)
RBC: 3.18 MIL/uL — AB (ref 4.22–5.81)
RDW: 15 % (ref 11.5–15.5)
WBC: 6 10*3/uL (ref 4.0–10.5)

## 2017-05-07 LAB — MAGNESIUM: MAGNESIUM: 1.7 mg/dL (ref 1.7–2.4)

## 2017-05-07 MED ORDER — CEFPODOXIME PROXETIL 200 MG PO TABS
200.0000 mg | ORAL_TABLET | Freq: Two times a day (BID) | ORAL | Status: DC
  Administered 2017-05-07: 200 mg via ORAL
  Filled 2017-05-07 (×2): qty 1

## 2017-05-07 MED ORDER — HEPARIN SOD (PORK) LOCK FLUSH 100 UNIT/ML IV SOLN
500.0000 [IU] | INTRAVENOUS | Status: DC
  Administered 2017-05-07: 500 [IU]

## 2017-05-07 MED ORDER — CEFPODOXIME PROXETIL 200 MG PO TABS: 200.0000 mg | ORAL_TABLET | Freq: Two times a day (BID) | ORAL | 0 refills | Status: AC

## 2017-05-07 MED ORDER — HEPARIN SOD (PORK) LOCK FLUSH 100 UNIT/ML IV SOLN: 500.0000 [IU] | INTRAVENOUS | Status: DC | PRN

## 2017-05-07 MED ORDER — PRO-STAT SUGAR FREE PO LIQD: 30.0000 mL | Freq: Two times a day (BID) | ORAL | 0 refills | Status: DC

## 2017-05-07 NOTE — Progress Notes (Signed)
Patient discharged via [facility] to home.

## 2017-05-07 NOTE — Progress Notes (Signed)
Discharge teaching complete. Meds, diet, activity, follow up appointments reviewed and all questions answered. Copy of instructions given to patient and prescriptions sent to pharmacy.  

## 2017-05-07 NOTE — Discharge Summary (Signed)
Physician Discharge Summary  [name] [name] DQQ:[id number] DOB: 07/08/1965 DOA: 05/04/2017  PCP: [name], MD  Admit date: 05/04/2017 Discharge date: 05/07/2017  Admitted From: Home Disposition: Home  Recommendations for Outpatient Follow-up:  1. Follow up with PCP in 1 week 2. Please obtain BMP/CBC in one week 3. Please follow up on the following pending results: Blood culture  Home Health: None Equipment/Devices: None  Discharge Condition: Stable CODE STATUS: Full code Diet recommendation: Regular   Brief/Interim Summary:  Admission HPI written by Florencia Reasons, MD   HPI: [name] [name] is a 52 y.o. 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 He denies abdominal pain, he report watery stool output from colostomy bag for about a month now. No cough, no sob, no chest pain, no lower extremity edema.  ED course: tmax 100.6, sinus tachycardia, blood pressure borderline. Wbc 11.3,cr unremarkable, lactic acid unremarkable. ua concerning for UTI, suprapubic catheter changed in the ED ( last changed one and half month ago), urine culture and blood culture sent, he is given zosyn x1 in the ED. I have requested CT ab/pel which did show chronic gastric distention and chronic osteomyelitis, but no acute findings. hospitalist called to admit the patient.       Hospital course:  Sepsis secondary to UTI Secondary to catheter. Suprapubic catheter changed in the ED. Patient empirically treated with Zosyn and Flagyl for possible C. Diff infection vs UTI. C. Diff negative and Flagyl discontinued. Urine culture significant for E. Coli, Proteus mirabilis and Pseudomonas aeruginosa. All bacteria sensitive to third-generation cephalosporin and patient transitioned to [facility] on discharge. Blood culture no growth for  two days.  Nausea/Vomiting Treated with antiemetics as needed. Resolved.  Chronic wounds Continue outpatient wound care. CT scan significant for chronic osteomyelitis.  Normocytic anemia Stable.   GERD Continued sucralafate, PPI   Quadriplegia with indwelling suprapubic catheter/colostomy Continued toviaz and baclofen  OSA Continued CPAP QHS   Hypokalemia Supplementation given  Hypomagnesemia Supplementation given    Discharge Diagnoses:  Principal Problem:   UTI (urinary tract infection) due to urinary indwelling catheter ([place]) Active Problems:   Quadriplegia ([name]   Sacral decubitus ulcer, stage IV ([name]   Suprapubic catheter ([place])   Severe protein-calorie malnutrition ([place])   Sepsis secondary to UTI [facility])   Recurrent UTI    Discharge Instructions  Discharge Instructions    Call MD for:  temperature >100.4   Complete by:  As directed    Diet - low sodium heart healthy   Complete by:  As directed    Increase activity slowly   Complete by:  As directed      Allergies as of 05/07/2017      Reactions   Feraheme [ferumoxytol] Other (See Comments)   SYNCOPE   Ditropan [oxybutynin] Other (See Comments)   Hallucinations    Vancomycin Other (See Comments)   ARF 05-2016 -- affects kidneys Has patient had a PCN reaction causing immediate rash, facial/tongue/throat swelling, SOB or lightheadedness with hypotension: Unk Has patient had a PCN reaction causing severe rash involving mucus membranes or skin necrosis: Unk Has patient had a PCN reaction that required hospitalization: Unk Has patient had a PCN reaction occurring within the last 10 years: Unk If all of the above answers are "NO", then may proceed with Cephalosporin use.  Medication List    STOP taking these medications   cephALEXin 500 MG capsule Commonly known as:  KEFLEX     TAKE these medications   baclofen 20 MG tablet Commonly known as:  LIORESAL Take 20 mg by mouth 4 (four)  times daily.   cefpodoxime 200 MG tablet Commonly known as:  VANTIN Take 1 tablet (200 mg total) by mouth every 12 (twelve) hours for 7 days.   feeding supplement (PRO-STAT SUGAR FREE 64) Liqd Take 30 mLs by mouth 2 (two) times daily.   ferrous sulfate 325 (65 FE) MG EC tablet Take 1 tablet (325 mg total) by mouth 3 (three) times daily with meals.   furosemide 20 MG tablet Commonly known as:  LASIX Take 1 tablet (20 mg total) by mouth 2 (two) times daily as needed for fluid or edema. Take with Klor-Con What changed:    when to take this  additional instructions   magnesium oxide 400 (241.3 Mg) MG tablet Commonly known as:  MAG-OX Take 1 tablet (400 mg total) by mouth daily.   metoCLOPramide 10 MG tablet Commonly known as:  REGLAN Take 1 tablet (10 mg total) by mouth every 6 (six) hours.   multivitamin with minerals Tabs tablet Take 1 tablet by mouth every morning.   pantoprazole 40 MG tablet Commonly known as:  PROTONIX Take 2 tablets (80 mg total) by mouth 2 (two) times daily before a meal. What changed:  how much to take   potassium chloride SA 20 MEQ tablet Commonly known as:  K-DUR,KLOR-CON Take 1 tablet (20 mEq total) by mouth 2 (two) times daily.   sucralfate 1 g tablet Commonly known as:  CARAFATE Take 1 tablet (1 g total) by mouth 4 (four) times daily.   TOVIAZ 8 MG Tb24 tablet Generic drug:  fesoterodine Take 8 mg by mouth daily.   vitamin C 500 MG tablet Commonly known as:  ASCORBIC ACID Take 500 mg by mouth every morning.   Zinc 50 MG Tabs Take 50 mg by mouth 2 (two) times daily.      Follow-up Information    [name], MD. Schedule an appointment as soon as possible for a visit in 1 week(s).   Specialty:  Internal Medicine Contact information: [address] [place] [address] [phone]          Allergies  Allergen Reactions  . Feraheme [Ferumoxytol] Other (See Comments)    SYNCOPE  . Ditropan [Oxybutynin]  Other (See Comments)    Hallucinations   . Vancomycin Other (See Comments)    ARF 05-2016 -- affects kidneys Has patient had a PCN reaction causing immediate rash, facial/tongue/throat swelling, SOB or lightheadedness with hypotension: Unk Has patient had a PCN reaction causing severe rash involving mucus membranes or skin necrosis: Unk Has patient had a PCN reaction that required hospitalization: Unk Has patient had a PCN reaction occurring within the last 10 years: Unk If all of the above answers are "NO", then may proceed with Cephalosporin use.     Consultations:  None   Procedures/Studies: Dg Chest 2 View  Result Date: 05/04/2017 CLINICAL DATA:  Patient with fever, nausea. EXAM: CHEST  2 VIEW COMPARISON:  Chest radiograph 04/19/2017. FINDINGS: Right anterior chest wall Port-A-Cath is present with tip projecting over the superior vena cava. Patient is rotated. Stable cardiomegaly. Low lung volumes. Basilar atelectasis. No large area pulmonary consolidation. No pleural effusion or pneumothorax. Lateral view is nondiagnostic. IMPRESSION: No acute cardiopulmonary process. Basilar atelectasis. Electronically  Signed   By: Lovey Newcomer M.D.   On: 05/04/2017 12:12   Dg Chest 2 View  Result Date: 04/19/2017 CLINICAL DATA:  51 y/o M; cough and congestion. Possible urinary tract infection. EXAM: CHEST  2 VIEW COMPARISON:  02/28/2017 chest radiograph FINDINGS: Stable normal cardiac silhouette given projection and technique. Right central venous catheter tip projects over upper SVC. No focal consolidation. No acute osseous abnormality identified. IMPRESSION: No acute pulmonary process identified. Electronically Signed   By: Kristine Garbe M.D.   On: 04/19/2017 02:34   US Renal  Result Date: 05/04/2017 CLINICAL DATA:  Fever and vomiting. EXAM: RENAL / URINARY TRACT ULTRASOUND COMPLETE COMPARISON:  CT abdomen pelvis dated February 05, 2017. FINDINGS: Right Kidney: Length: 11.0 cm.  Echogenicity within normal limits. No mass or hydronephrosis visualized. Left Kidney: Length: 11.6 cm. Echogenicity within normal limits. No mass or hydronephrosis visualized. Small 1.1 cm simple cyst in the midpole, grossly unchanged when compared to prior CT. Bladder: Decompressed by Foley catheter. IMPRESSION: No acute abnormality. Electronically Signed   By: Titus Dubin M.D.   On: 05/04/2017 14:37   Ct Renal Stone Study  Result Date: 05/04/2017 CLINICAL DATA:  50 year old male with acute abdominal pain and nausea and vomiting. EXAM: CT ABDOMEN AND PELVIS WITHOUT CONTRAST TECHNIQUE: Multidetector CT imaging of the abdomen and pelvis was performed following the standard protocol without IV contrast. COMPARISON:  05/04/2017 ultrasound, 02/05/2017 CT and prior studies FINDINGS: Please note that parenchymal abnormalities may be missed without intravenous contrast. Lower chest: Mild bibasilar atelectasis/scarring again noted. Mild cardiomegaly identified. Hepatobiliary: The liver and gallbladder are unremarkable. No definite biliary dilatation. Pancreas: Unremarkable Spleen: Unremarkable Adrenals/Urinary Tract: The kidneys are unchanged without acute abnormality. No evidence of hydronephrosis or urinary calculi. A suprapubic catheter within the bladder is again noted. The adrenal glands are unremarkable. Stomach/Bowel: Gastric distension is unchanged. There is no evidence of small bowel obstruction. No definite bowel wall thickening or inflammation noted. Vascular/Lymphatic: No significant vascular findings are present. No enlarged abdominal or pelvic lymph nodes. Reproductive: Unremarkable Other: No ascites, pneumoperitoneum or focal collection. Musculoskeletal: Severe chronic changes of the pelvis and hips again noted and unchanged. Right pelvic/hip ulceration and soft tissue is also unchanged. No acute bony abnormalities are identified. IMPRESSION: 1. No evidence of acute abnormality 2. Unchanged chronic  pelvic and hip changes. Chronic osteomyelitis/infection is not excluded. 3. Mild bibasilar atelectasis/scarring. Electronically Signed   By: Margarette Canada M.D.   On: 05/04/2017 16:26      Subjective: No concerns today. Afebrile.  Discharge Exam: Vitals:   05/07/17 0514 05/07/17 0936  BP: 108/73 118/67  Pulse: 84 91  Resp: 18 18  Temp: 99.1 F (37.3 C) 97.9 F (36.6 C)  SpO2: 98% 98%   Vitals:   05/06/17 2143 05/07/17 0122 05/07/17 0514 05/07/17 0936  BP: 124/75 128/77 108/73 118/67  Pulse: 86 81 84 91  Resp: 20 20 18 18   Temp: 99.1 F (37.3 C) 99.1 F (37.3 C) 99.1 F (37.3 C) 97.9 F (36.6 C)  TempSrc: Oral Oral Oral Oral  SpO2: 95% 98% 98% 98%    General: Pt is alert, awake, not in acute distress Cardiovascular: RRR, S1/S2 +, no rubs, no gallops Respiratory: CTA bilaterally, no wheezing, no rhonchi Abdominal: Soft, NT, ND, bowel sounds + Extremities: 2+ LE edema, no cyanosis. Contractures    The results of significant diagnostics from this hospitalization (including imaging, microbiology, ancillary and laboratory) are listed below for reference.     Microbiology: Recent  Results (from the past 240 hour(s))  Urine culture     Status: Abnormal   Collection Time: 05/04/17 11:16 AM  Result Value Ref Range Status   Specimen Description URINE, SUPRAPUBIC  Final   Special Requests NONE  Final   Culture (A)  Final    >=100,000 COLONIES/mL ESCHERICHIA COLI >=100,000 COLONIES/mL PROTEUS MIRABILIS >=100,000 COLONIES/mL PSEUDOMONAS AERUGINOSA    Report Status 05/07/2017 FINAL  Final   Organism ID, Bacteria ESCHERICHIA COLI (A)  Final   Organism ID, Bacteria PROTEUS MIRABILIS (A)  Final   Organism ID, Bacteria PSEUDOMONAS AERUGINOSA (A)  Final      Susceptibility   Escherichia coli - MIC*    AMPICILLIN <=2 SENSITIVE Sensitive     CEFAZOLIN <=4 SENSITIVE Sensitive     CEFTRIAXONE <=1 SENSITIVE Sensitive     CIPROFLOXACIN >=4 RESISTANT Resistant     GENTAMICIN <=1  SENSITIVE Sensitive     IMIPENEM <=0.25 SENSITIVE Sensitive     NITROFURANTOIN <=16 SENSITIVE Sensitive     TRIMETH/SULFA <=20 SENSITIVE Sensitive     AMPICILLIN/SULBACTAM <=2 SENSITIVE Sensitive     PIP/TAZO <=4 SENSITIVE Sensitive     Extended ESBL NEGATIVE Sensitive     * >=100,000 COLONIES/mL ESCHERICHIA COLI   Pseudomonas aeruginosa - MIC*    CEFTAZIDIME 4 SENSITIVE Sensitive     CIPROFLOXACIN 2 INTERMEDIATE Intermediate     GENTAMICIN <=1 SENSITIVE Sensitive     IMIPENEM >=16 RESISTANT Resistant     PIP/TAZO 8 SENSITIVE Sensitive     CEFEPIME 8 SENSITIVE Sensitive     * >=100,000 COLONIES/mL PSEUDOMONAS AERUGINOSA   Proteus mirabilis - MIC*    AMPICILLIN >=32 RESISTANT Resistant     CEFAZOLIN <=4 SENSITIVE Sensitive     CEFTRIAXONE <=1 SENSITIVE Sensitive     CIPROFLOXACIN >=4 RESISTANT Resistant     GENTAMICIN 8 INTERMEDIATE Intermediate     IMIPENEM 1 SENSITIVE Sensitive     NITROFURANTOIN 128 RESISTANT Resistant     TRIMETH/SULFA >=320 RESISTANT Resistant     AMPICILLIN/SULBACTAM >=32 RESISTANT Resistant     PIP/TAZO <=4 SENSITIVE Sensitive     * >=100,000 COLONIES/mL PROTEUS MIRABILIS  Culture, blood (routine x 2)     Status: None (Preliminary result)   Collection Time: 05/04/17 11:21 AM  Result Value Ref Range Status   Specimen Description BLOOD PORTA CATH  Final   Special Requests   Final    BOTTLES DRAWN AEROBIC AND ANAEROBIC Blood Culture adequate volume   Culture NO GROWTH 2 DAYS  Final   Report Status PENDING  Incomplete  Culture, blood (routine x 2)     Status: None (Preliminary result)   Collection Time: 05/04/17 11:37 AM  Result Value Ref Range Status   Specimen Description BLOOD RIGHT HAND  Final   Special Requests IN PEDIATRIC BOTTLE Blood Culture adequate volume  Final   Culture NO GROWTH 2 DAYS  Final   Report Status PENDING  Incomplete  C difficile quick scan w PCR reflex     Status: None   Collection Time: 05/04/17  5:25 PM  Result Value Ref Range  Status   C Diff antigen NEGATIVE NEGATIVE Final   C Diff toxin NEGATIVE NEGATIVE Final   C Diff interpretation No C. difficile detected.  Final  MRSA PCR Screening     Status: Abnormal   Collection Time: 05/04/17  6:06 PM  Result Value Ref Range Status   MRSA by PCR POSITIVE (A) NEGATIVE Final    Comment:  The GeneXpert MRSA Assay (FDA approved for NASAL specimens only), is one component of a comprehensive MRSA colonization surveillance program. It is not intended to diagnose MRSA infection nor to guide or monitor treatment for MRSA infections. RESULT CALLED TO, READ BACK BY AND VERIFIED WITH: P ASIDI RN [id number] 05/04/17 A BROWNING      Labs: BNP (last 3 results) No results for input(s): BNP in the last 8760 hours. Basic Metabolic Panel: Recent Labs  Lab 05/04/17 1116 05/05/17 0418 05/06/17 0445 05/07/17 0336  NA 138 138 138 138  K 4.0 3.5 3.4* 3.5  CL 108 109 110 110  CO2 23 24 23 25   GLUCOSE 108* 88 112* 111*  BUN 23* 22* 15 16  CREATININE 0.70 0.58* 0.47* 0.64  CALCIUM 8.6* 8.4* 8.4* 8.4*  MG  --  1.4*  --  1.7   Liver Function Tests: Recent Labs  Lab 05/04/17 1116  AST 31  ALT 37  ALKPHOS 78  BILITOT 0.4  PROT 8.2*  ALBUMIN 2.8*   No results for input(s): LIPASE, AMYLASE in the last 168 hours. No results for input(s): AMMONIA in the last 168 hours. CBC: Recent Labs  Lab 05/04/17 1116 05/05/17 0418 05/06/17 0445 05/07/17 0336  WBC 11.3* 6.4 6.2 6.0  NEUTROABS 8.8*  --   --   --   HGB 9.3* 8.4* 7.8* 7.7*  HCT 31.1* 28.0* 26.9* 26.2*  MCV 81.6 82.1 82.8 82.4  PLT 399 330 341 328   Cardiac Enzymes: No results for input(s): CKTOTAL, CKMB, CKMBINDEX, TROPONINI in the last 168 hours. BNP: Invalid input(s): POCBNP CBG: Recent Labs  Lab 05/04/17 2137 05/05/17 1129  GLUCAP 94 93   D-Dimer No results for input(s): DDIMER in the last 72 hours. Hgb A1c No results for input(s): HGBA1C in the last 72 hours. Lipid Profile No results for  input(s): CHOL, HDL, LDLCALC, TRIG, CHOLHDL, LDLDIRECT in the last 72 hours. Thyroid function studies No results for input(s): TSH, T4TOTAL, T3FREE, THYROIDAB in the last 72 hours.  Invalid input(s): FREET3 Anemia work up No results for input(s): VITAMINB12, FOLATE, FERRITIN, TIBC, IRON, RETICCTPCT in the last 72 hours. Urinalysis    Component Value Date/Time   COLORURINE YELLOW 05/04/2017 1121   APPEARANCEUR CLOUDY (A) 05/04/2017 1121   LABSPEC 1.015 05/04/2017 1121   PHURINE 6.0 05/04/2017 1121   GLUCOSEU NEGATIVE 05/04/2017 1121   HGBUR SMALL (A) 05/04/2017 1121   BILIRUBINUR NEGATIVE 05/04/2017 1121   KETONESUR NEGATIVE 05/04/2017 1121   PROTEINUR NEGATIVE 05/04/2017 1121   UROBILINOGEN 1.0 03/16/2015 0835   NITRITE POSITIVE (A) 05/04/2017 1121   LEUKOCYTESUR LARGE (A) 05/04/2017 1121   Sepsis Labs Invalid input(s): PROCALCITONIN,  WBC,  LACTICIDVEN Microbiology Recent Results (from the past 240 hour(s))  Urine culture     Status: Abnormal   Collection Time: 05/04/17 11:16 AM  Result Value Ref Range Status   Specimen Description URINE, SUPRAPUBIC  Final   Special Requests NONE  Final   Culture (A)  Final    >=100,000 COLONIES/mL ESCHERICHIA COLI >=100,000 COLONIES/mL PROTEUS MIRABILIS >=100,000 COLONIES/mL PSEUDOMONAS AERUGINOSA    Report Status 05/07/2017 FINAL  Final   Organism ID, Bacteria ESCHERICHIA COLI (A)  Final   Organism ID, Bacteria PROTEUS MIRABILIS (A)  Final   Organism ID, Bacteria PSEUDOMONAS AERUGINOSA (A)  Final      Susceptibility   Escherichia coli - MIC*    AMPICILLIN <=2 SENSITIVE Sensitive     CEFAZOLIN <=4 SENSITIVE Sensitive     CEFTRIAXONE <=1  SENSITIVE Sensitive     CIPROFLOXACIN >=4 RESISTANT Resistant     GENTAMICIN <=1 SENSITIVE Sensitive     IMIPENEM <=0.25 SENSITIVE Sensitive     NITROFURANTOIN <=16 SENSITIVE Sensitive     TRIMETH/SULFA <=20 SENSITIVE Sensitive     AMPICILLIN/SULBACTAM <=2 SENSITIVE Sensitive     PIP/TAZO <=4  SENSITIVE Sensitive     Extended ESBL NEGATIVE Sensitive     * >=100,000 COLONIES/mL ESCHERICHIA COLI   Pseudomonas aeruginosa - MIC*    CEFTAZIDIME 4 SENSITIVE Sensitive     CIPROFLOXACIN 2 INTERMEDIATE Intermediate     GENTAMICIN <=1 SENSITIVE Sensitive     IMIPENEM >=16 RESISTANT Resistant     PIP/TAZO 8 SENSITIVE Sensitive     CEFEPIME 8 SENSITIVE Sensitive     * >=100,000 COLONIES/mL PSEUDOMONAS AERUGINOSA   Proteus mirabilis - MIC*    AMPICILLIN >=32 RESISTANT Resistant     CEFAZOLIN <=4 SENSITIVE Sensitive     CEFTRIAXONE <=1 SENSITIVE Sensitive     CIPROFLOXACIN >=4 RESISTANT Resistant     GENTAMICIN 8 INTERMEDIATE Intermediate     IMIPENEM 1 SENSITIVE Sensitive     NITROFURANTOIN 128 RESISTANT Resistant     TRIMETH/SULFA >=320 RESISTANT Resistant     AMPICILLIN/SULBACTAM >=32 RESISTANT Resistant     PIP/TAZO <=4 SENSITIVE Sensitive     * >=100,000 COLONIES/mL PROTEUS MIRABILIS  Culture, blood (routine x 2)     Status: None (Preliminary result)   Collection Time: 05/04/17 11:21 AM  Result Value Ref Range Status   Specimen Description BLOOD PORTA CATH  Final   Special Requests   Final    BOTTLES DRAWN AEROBIC AND ANAEROBIC Blood Culture adequate volume   Culture NO GROWTH 2 DAYS  Final   Report Status PENDING  Incomplete  Culture, blood (routine x 2)     Status: None (Preliminary result)   Collection Time: 05/04/17 11:37 AM  Result Value Ref Range Status   Specimen Description BLOOD RIGHT HAND  Final   Special Requests IN PEDIATRIC BOTTLE Blood Culture adequate volume  Final   Culture NO GROWTH 2 DAYS  Final   Report Status PENDING  Incomplete  C difficile quick scan w PCR reflex     Status: None   Collection Time: 05/04/17  5:25 PM  Result Value Ref Range Status   C Diff antigen NEGATIVE NEGATIVE Final   C Diff toxin NEGATIVE NEGATIVE Final   C Diff interpretation No C. difficile detected.  Final  MRSA PCR Screening     Status: Abnormal   Collection Time: 05/04/17   6:06 PM  Result Value Ref Range Status   MRSA by PCR POSITIVE (A) NEGATIVE Final    Comment:        The GeneXpert MRSA Assay (FDA approved for NASAL specimens only), is one component of a comprehensive MRSA colonization surveillance program. It is not intended to diagnose MRSA infection nor to guide or monitor treatment for MRSA infections. RESULT CALLED TO, READ BACK BY AND VERIFIED WITH: Viviana Simpler RN 2312 05/04/17 A BROWNING      SIGNED:   Cordelia Poche, MD [facility] 05/07/2017, 12:40 PM Pager [phone]  If 7PM-7AM, please contact night-coverage [email or web address] Password [email or web address]

## 2017-05-07 NOTE — Progress Notes (Signed)
Pharmacy Antibiotic Note  [name] [name] is a 51 y.o. male admitted on 05/04/2017 with sepsis due to a UTI. WBC's have trended down, he is afebrile, and renal function is stable. His urine culture grew over 100,000 colonies of E-coli, Proteus, and Pseudomonas. Susceptibilities are still pending, continue current therapy.  Plan: Zosyn 3.375g IV q8h EI Follow c/s, clinical progression, renal function, LOT/de-escalation  Temp (24hrs), Avg:98.8 F (37.1 C), Min:97.7 F (36.5 C), Max:99.1 F (37.3 C)  Recent Labs  Lab 05/04/17 1116 05/04/17 1235 05/05/17 0418 05/06/17 0445 05/07/17 0336  WBC 11.3*  --  6.4 6.2 6.0  CREATININE 0.70  --  0.58* 0.47* 0.64  LATICACIDVEN  --  0.49* 0.6  --   --     Estimated Creatinine Clearance: 132.8 mL/min (by C-G formula based on SCr of 0.64 mg/dL).    Antimicrobials this admission: Zosyn 12/20 >>  Flagyl 12/20 >> 12/21  Microbiology results: 12/20BCx: ngtd 12/20UCx: >100K GNR - reincubated(has e-coli, proteus, pseudomonas) >>susceptibilities pending 12/20 CDiff: antigen and toxin neg 12/20MRSA PCR: pos  Thank you for allowing pharmacy to be a part of this patient's care.   Patterson Hammersmith PharmD PGY1 Pharmacy Practice Resident 05/07/2017 9:39 AM Pager: [phone] Phone: [phone]

## 2017-05-08 NOTE — Care Management (Signed)
05/08/2017 at 1500: received phone call from patient that he was not able to get his medications for the pharmacy at d/c. CM called [facility] and his antibiotic ordered is not on the formulary. dishcarging MD, Dr Lonny Prude notified and he provided a new prescription for different antibiotics. CM called [facility] and faxed the prescription to the pharmacy: [phone]. CM called the patient and informed him of new prescription to get from the pharmacy.  Per [facility] patient will have to order the prostat and pay for this out of pocket. Pt made aware. He plans on picking up medications today.

## 2017-05-09 LAB — CULTURE, BLOOD (ROUTINE X 2)
CULTURE: NO GROWTH
Culture: NO GROWTH
SPECIAL REQUESTS: ADEQUATE
SPECIAL REQUESTS: ADEQUATE

## 2017-05-12 DIAGNOSIS — E43 Unspecified severe protein-calorie malnutrition: Secondary | ICD-10-CM | POA: Diagnosis not present

## 2017-05-12 DIAGNOSIS — F329 Major depressive disorder, single episode, unspecified: Secondary | ICD-10-CM | POA: Diagnosis not present

## 2017-05-12 DIAGNOSIS — L97513 Non-pressure chronic ulcer of other part of right foot with necrosis of muscle: Secondary | ICD-10-CM | POA: Diagnosis not present

## 2017-05-12 DIAGNOSIS — I10 Essential (primary) hypertension: Secondary | ICD-10-CM | POA: Diagnosis not present

## 2017-05-12 DIAGNOSIS — L89114 Pressure ulcer of right upper back, stage 4: Secondary | ICD-10-CM | POA: Diagnosis not present

## 2017-05-12 DIAGNOSIS — D649 Anemia, unspecified: Secondary | ICD-10-CM | POA: Diagnosis not present

## 2017-05-12 DIAGNOSIS — G8250 Quadriplegia, unspecified: Secondary | ICD-10-CM | POA: Diagnosis not present

## 2017-05-12 DIAGNOSIS — E662 Morbid (severe) obesity with alveolar hypoventilation: Secondary | ICD-10-CM | POA: Diagnosis not present

## 2017-05-12 DIAGNOSIS — L89154 Pressure ulcer of sacral region, stage 4: Secondary | ICD-10-CM | POA: Diagnosis not present

## 2017-05-12 DIAGNOSIS — L89153 Pressure ulcer of sacral region, stage 3: Secondary | ICD-10-CM | POA: Diagnosis not present

## 2017-05-15 DIAGNOSIS — S61202A Unspecified open wound of right middle finger without damage to nail, initial encounter: Secondary | ICD-10-CM | POA: Diagnosis not present

## 2017-05-15 DIAGNOSIS — L89894 Pressure ulcer of other site, stage 4: Secondary | ICD-10-CM | POA: Diagnosis not present

## 2017-05-15 DIAGNOSIS — I10 Essential (primary) hypertension: Secondary | ICD-10-CM | POA: Diagnosis not present

## 2017-05-15 DIAGNOSIS — L89222 Pressure ulcer of left hip, stage 2: Secondary | ICD-10-CM | POA: Diagnosis present

## 2017-05-15 DIAGNOSIS — G8250 Quadriplegia, unspecified: Secondary | ICD-10-CM | POA: Diagnosis not present

## 2017-05-15 DIAGNOSIS — S91301A Unspecified open wound, right foot, initial encounter: Secondary | ICD-10-CM | POA: Diagnosis not present

## 2017-05-15 DIAGNOSIS — L89893 Pressure ulcer of other site, stage 3: Secondary | ICD-10-CM | POA: Diagnosis not present

## 2017-05-15 DIAGNOSIS — X58XXXA Exposure to other specified factors, initial encounter: Secondary | ICD-10-CM | POA: Diagnosis not present

## 2017-05-15 DIAGNOSIS — S60422A Blister (nonthermal) of right middle finger, initial encounter: Secondary | ICD-10-CM | POA: Diagnosis not present

## 2017-05-15 DIAGNOSIS — G4730 Sleep apnea, unspecified: Secondary | ICD-10-CM | POA: Diagnosis not present

## 2017-05-15 DIAGNOSIS — S61204A Unspecified open wound of right ring finger without damage to nail, initial encounter: Secondary | ICD-10-CM | POA: Diagnosis not present

## 2017-05-15 DIAGNOSIS — L89154 Pressure ulcer of sacral region, stage 4: Secondary | ICD-10-CM | POA: Diagnosis not present

## 2017-05-17 DIAGNOSIS — N390 Urinary tract infection, site not specified: Secondary | ICD-10-CM | POA: Diagnosis not present

## 2017-05-17 DIAGNOSIS — Z09 Encounter for follow-up examination after completed treatment for conditions other than malignant neoplasm: Secondary | ICD-10-CM | POA: Diagnosis not present

## 2017-05-19 DIAGNOSIS — L89114 Pressure ulcer of right upper back, stage 4: Secondary | ICD-10-CM | POA: Diagnosis not present

## 2017-05-19 DIAGNOSIS — E43 Unspecified severe protein-calorie malnutrition: Secondary | ICD-10-CM | POA: Diagnosis not present

## 2017-05-19 DIAGNOSIS — A419 Sepsis, unspecified organism: Secondary | ICD-10-CM | POA: Diagnosis not present

## 2017-05-19 DIAGNOSIS — L97513 Non-pressure chronic ulcer of other part of right foot with necrosis of muscle: Secondary | ICD-10-CM | POA: Diagnosis not present

## 2017-05-19 DIAGNOSIS — I10 Essential (primary) hypertension: Secondary | ICD-10-CM | POA: Diagnosis not present

## 2017-05-19 DIAGNOSIS — G8250 Quadriplegia, unspecified: Secondary | ICD-10-CM | POA: Diagnosis not present

## 2017-05-19 DIAGNOSIS — L89154 Pressure ulcer of sacral region, stage 4: Secondary | ICD-10-CM | POA: Diagnosis not present

## 2017-05-19 DIAGNOSIS — L89153 Pressure ulcer of sacral region, stage 3: Secondary | ICD-10-CM | POA: Diagnosis not present

## 2017-05-19 DIAGNOSIS — E662 Morbid (severe) obesity with alveolar hypoventilation: Secondary | ICD-10-CM | POA: Diagnosis not present

## 2017-05-19 DIAGNOSIS — L89159 Pressure ulcer of sacral region, unspecified stage: Secondary | ICD-10-CM | POA: Diagnosis not present

## 2017-05-19 DIAGNOSIS — L89119 Pressure ulcer of right upper back, unspecified stage: Secondary | ICD-10-CM | POA: Diagnosis not present

## 2017-05-19 DIAGNOSIS — Z9350 Unspecified cystostomy status: Secondary | ICD-10-CM | POA: Diagnosis not present

## 2017-05-19 DIAGNOSIS — D649 Anemia, unspecified: Secondary | ICD-10-CM | POA: Diagnosis not present

## 2017-05-19 DIAGNOSIS — F329 Major depressive disorder, single episode, unspecified: Secondary | ICD-10-CM | POA: Diagnosis not present

## 2017-05-19 DIAGNOSIS — K219 Gastro-esophageal reflux disease without esophagitis: Secondary | ICD-10-CM | POA: Diagnosis not present

## 2017-05-19 DIAGNOSIS — G4730 Sleep apnea, unspecified: Secondary | ICD-10-CM | POA: Diagnosis not present

## 2017-05-22 IMAGING — CT CT ABD-PELV W/ CM
2 of 5 series · 16 of 46 positions shown, 18 images · IV contrast (OMNIPAQUE 300)
Comparison: 09/02/2014

CLINICAL DATA: Pitting after eating starting [REDACTED], generalized
abdominal cramping, quadriplegia

EXAM:
CT ABDOMEN AND PELVIS WITH CONTRAST
TECHNIQUE: Multidetector CT imaging of the abdomen and pelvis was performed
using the standard protocol following bolus administration of
intravenous contrast.
CONTRAST:  100mL OMNIPAQUE IOHEXOL 300 MG/ML  SOLN

[Series 2: abd/pel with · axial · 0.98mm/px · z∈[-226,+174]mm · 13 of 92 slices shown, 15 images]
[im 6/92  soft-tissue]
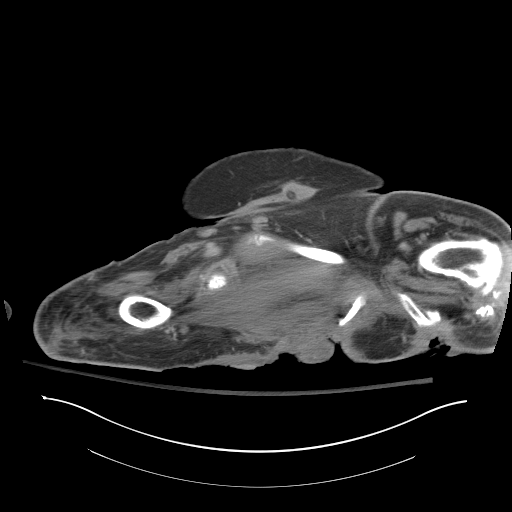
[im 6/92  bone]
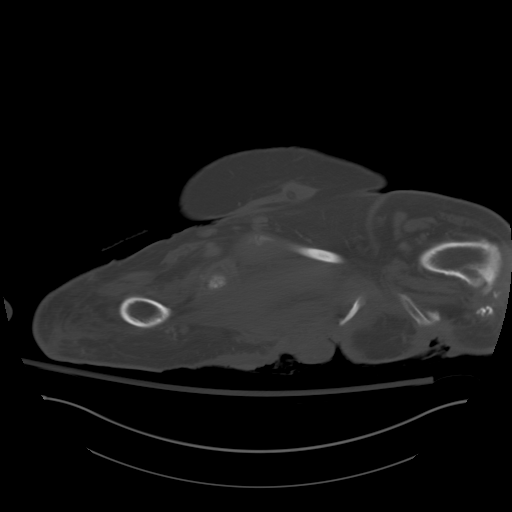
[im 11/92  soft-tissue]
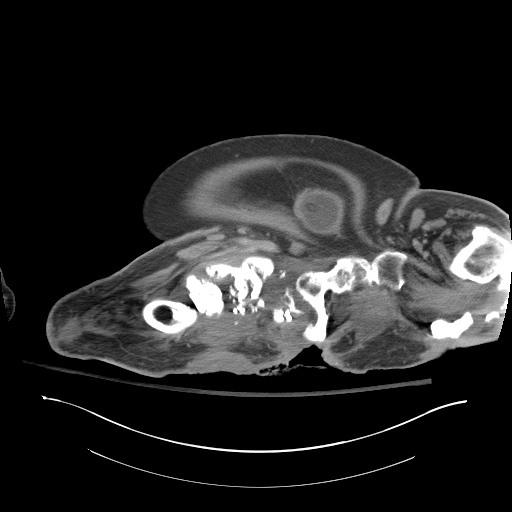
[im 21/92  soft-tissue]
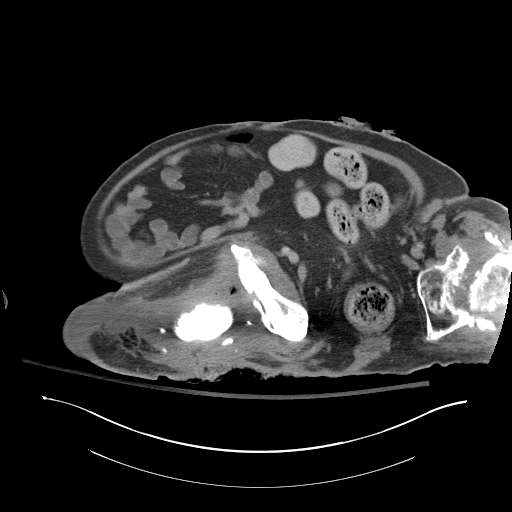
[im 26/92  soft-tissue]
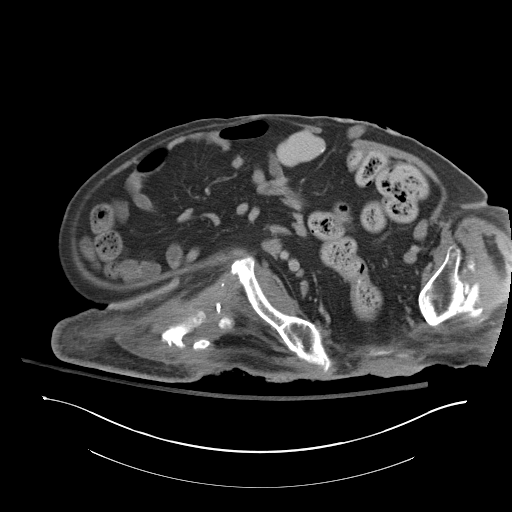
[im 31/92  soft-tissue]
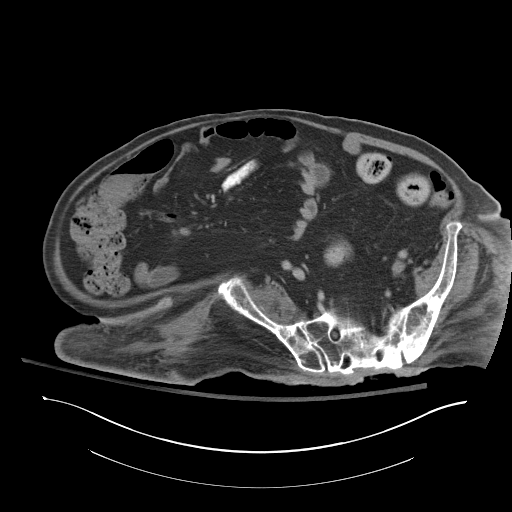
[im 41/92  soft-tissue]
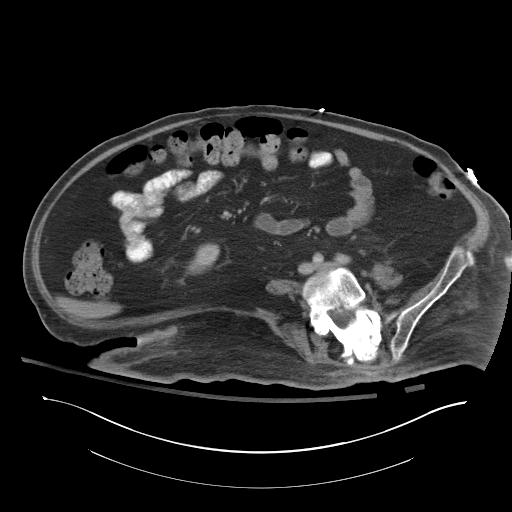
[im 46/92  soft-tissue]
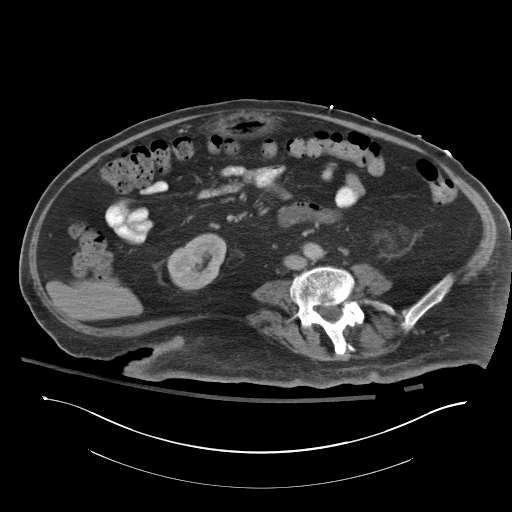
[im 51/92  soft-tissue]
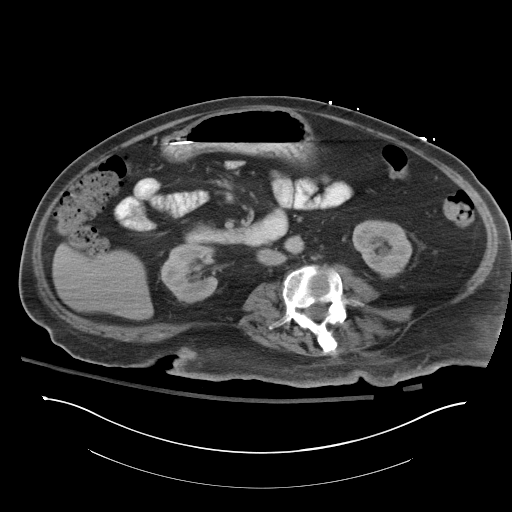
[im 61/92  soft-tissue]
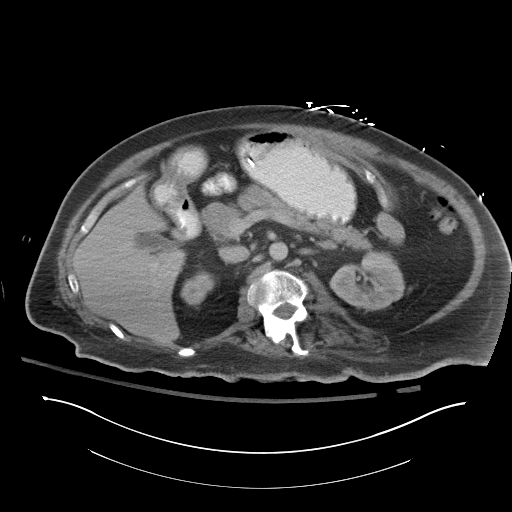
[im 61/92  bone]
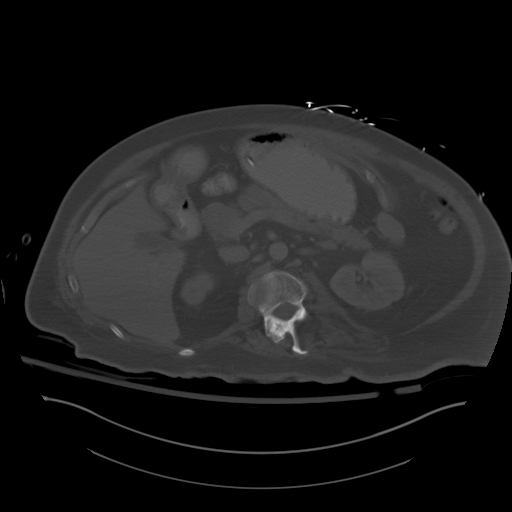
[im 66/92  soft-tissue]
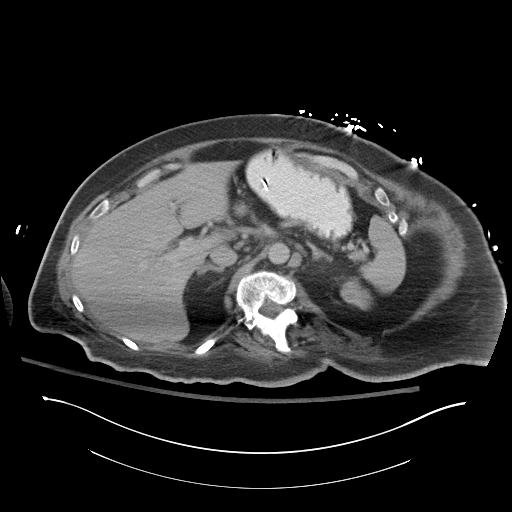
[im 71/92  soft-tissue]
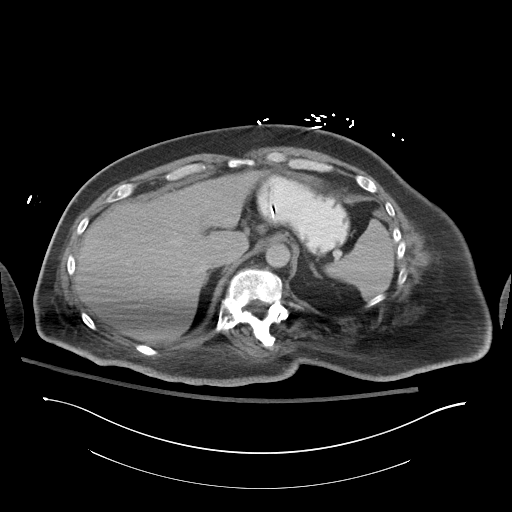
[im 81/92  soft-tissue]
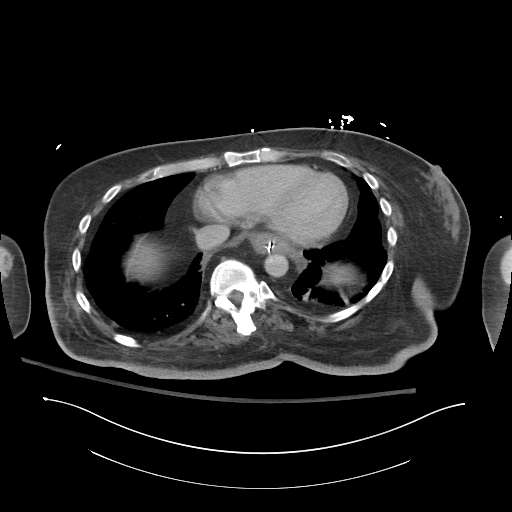
[im 86/92  soft-tissue]
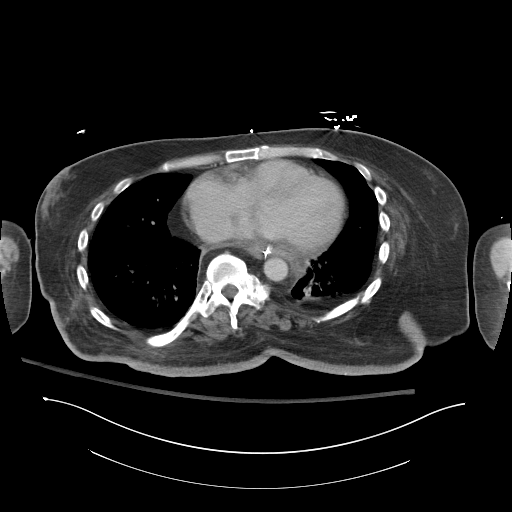

[Series 3: coronal a/|p · coronal · 1.03mm/px · 3 of 101 slices shown]
[im 34/101  soft-tissue]
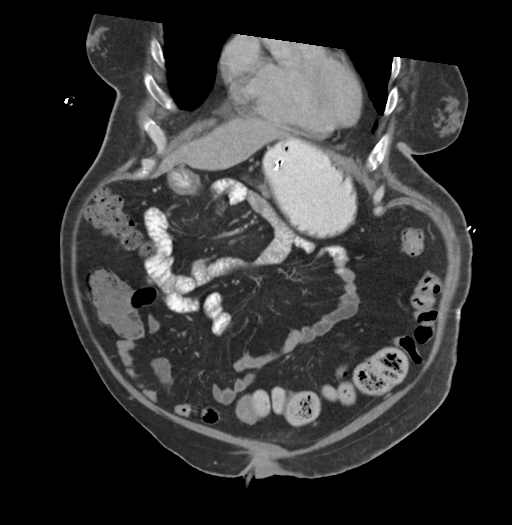
[im 45/101  soft-tissue]
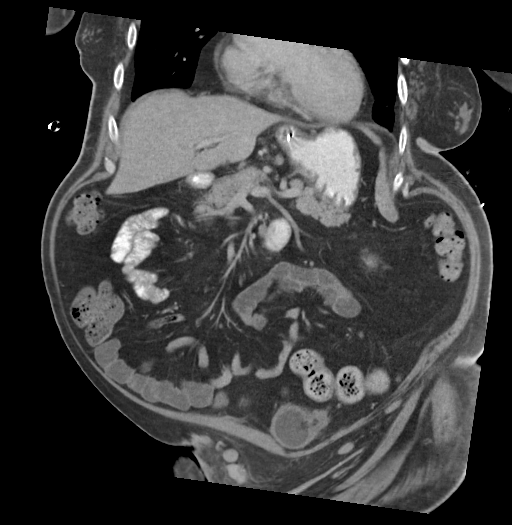
[im 56/101  soft-tissue]
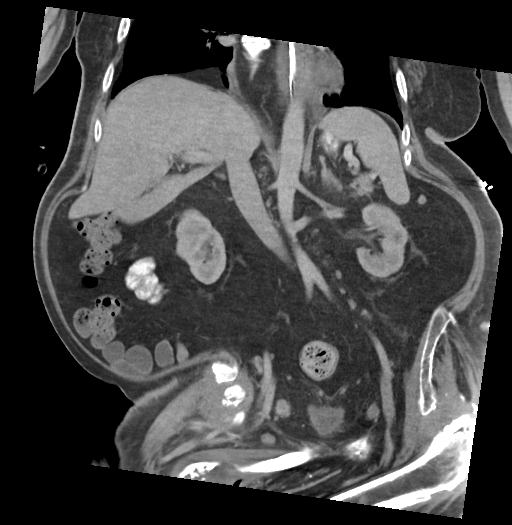

[16 of 46 positions shown; findings below may reference images not displayed]

FINDINGS: Small bilateral pleural effusion left greater than right. NG tube in
place with tip in distal stomach. There is mild fluid and gas within
stomach without evidence of gastric outlet obstruction.

Sagittal images of the spine shows mild degenerative changes lumbar
spine. Enhanced liver is unremarkable. The gallbladder is contracted
without evidence of calcified gallstones. Enhanced pancreas, spleen
and adrenal glands are unremarkable. Enhanced kidneys are
symmetrical in size. No hydronephrosis or hydroureter.

There is no small bowel obstruction. A colostomy is noted in left
lower quadrant. No ascites or free air. No adenopathy. No pericecal
inflammation. Normal appendix is noted in axial image 64.

Again noted chronic decubitus ulcer in right gluteal region. Again
noted fistulous communication of the ulcer with right hip joint with
small amount of intra-articular air. Extensive dystrophic
degenerative changes bilateral hip joints and fragmentation of the
acetabulum and femoral head bilaterally stable from prior exam.
Again noted a suprapubic bladder catheter. Decubitus skin ulcer in
midline posterior perineum again noted.

Delayed renal images shows bilateral renal symmetrical excretion.
Bilateral visualized proximal ureter is unremarkable.
IMPRESSION: 1. There is no acute inflammatory process within abdomen.
2. NG tube in place. Small fluid and gas noted within stomach
without evidence of gastric outlet obstruction.
3. No hydronephrosis or hydroureter.
4. No small bowel obstruction. No pericecal inflammation. Normal
appendix.
5. Again noted decubitus ulcer in right gluteal region. Again noted
fistulous communication of the ulcer with right hip joint. Stable
intra-articular soft tissue air. Again noted extensive dystrophic
and degenerative changes bilateral hip joints.
6. There is a colostomy in left lower quadrant. A suprapubic bladder
catheter is noted.
7. Bilateral renal symmetrical excretion.

## 2017-05-22 IMAGING — CR DG ABDOMEN ACUTE W/ 1V CHEST
6 series · 6 of 6 positions shown · non-contrast
Comparison: 09/05/2014

CLINICAL DATA: Abdominal cramping for 4 days, loose stool,
colostomy, paraplegia

EXAM:
DG ABDOMEN ACUTE W/ 1V CHEST

[x chest ap]
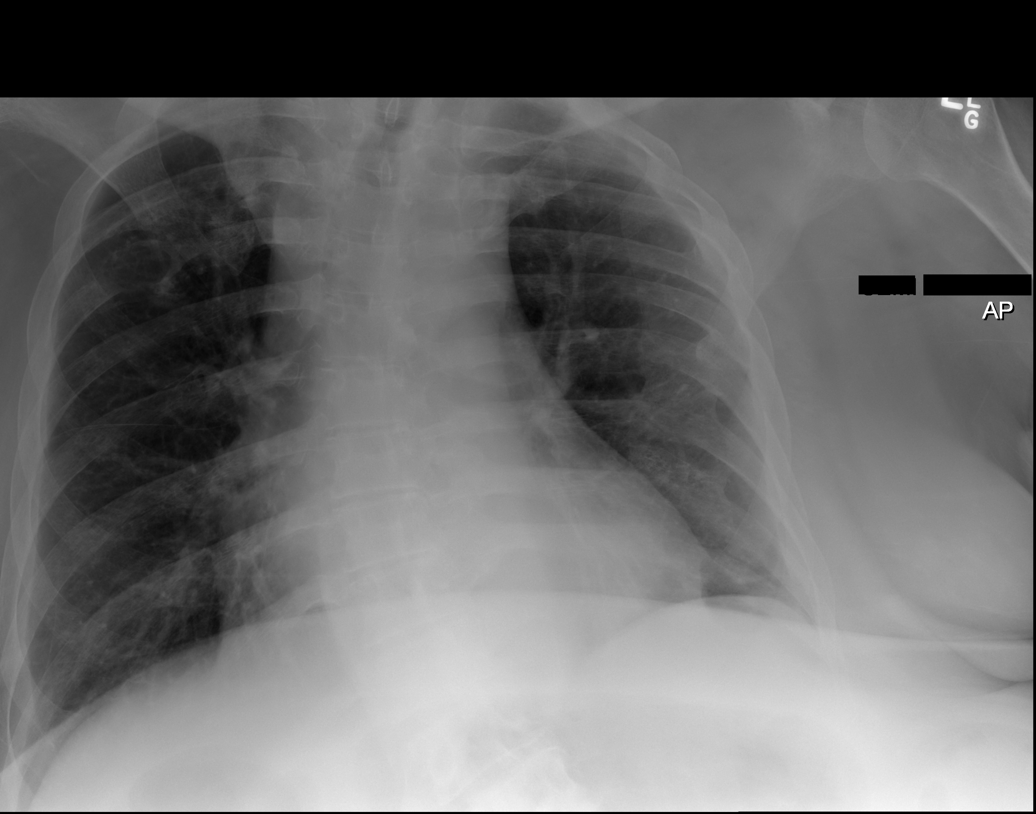

[x abdomen supine (1 of 2)]
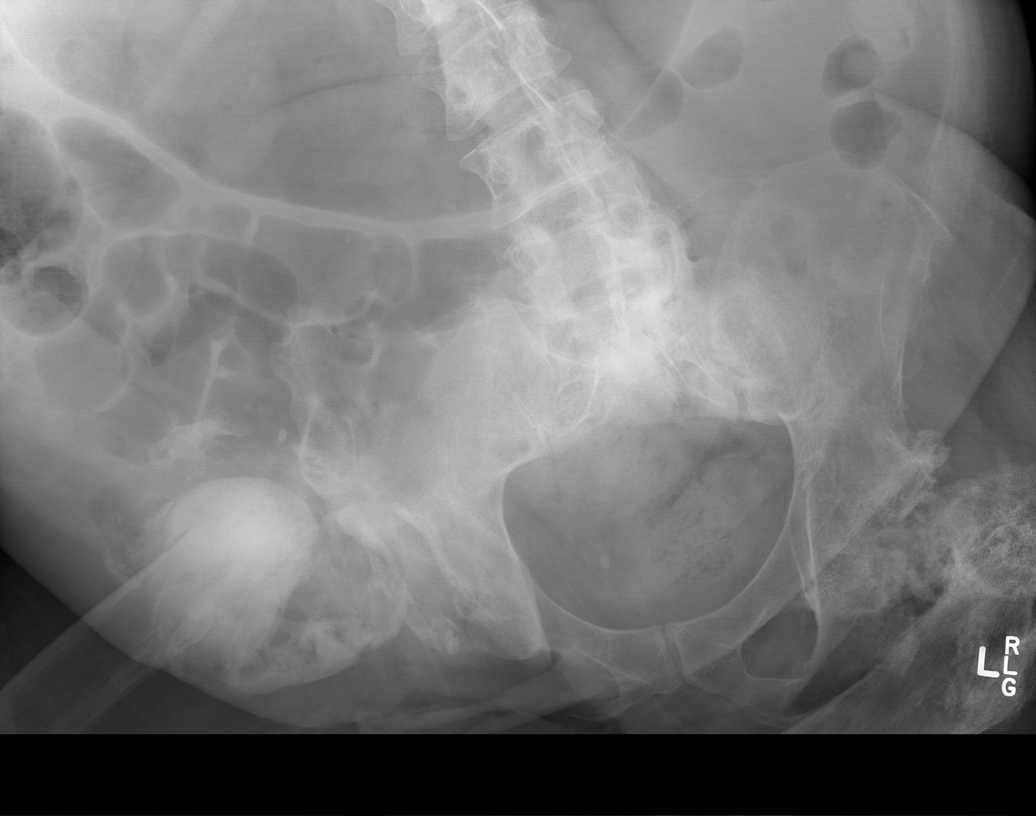

[x abdomen supine (2 of 2)]
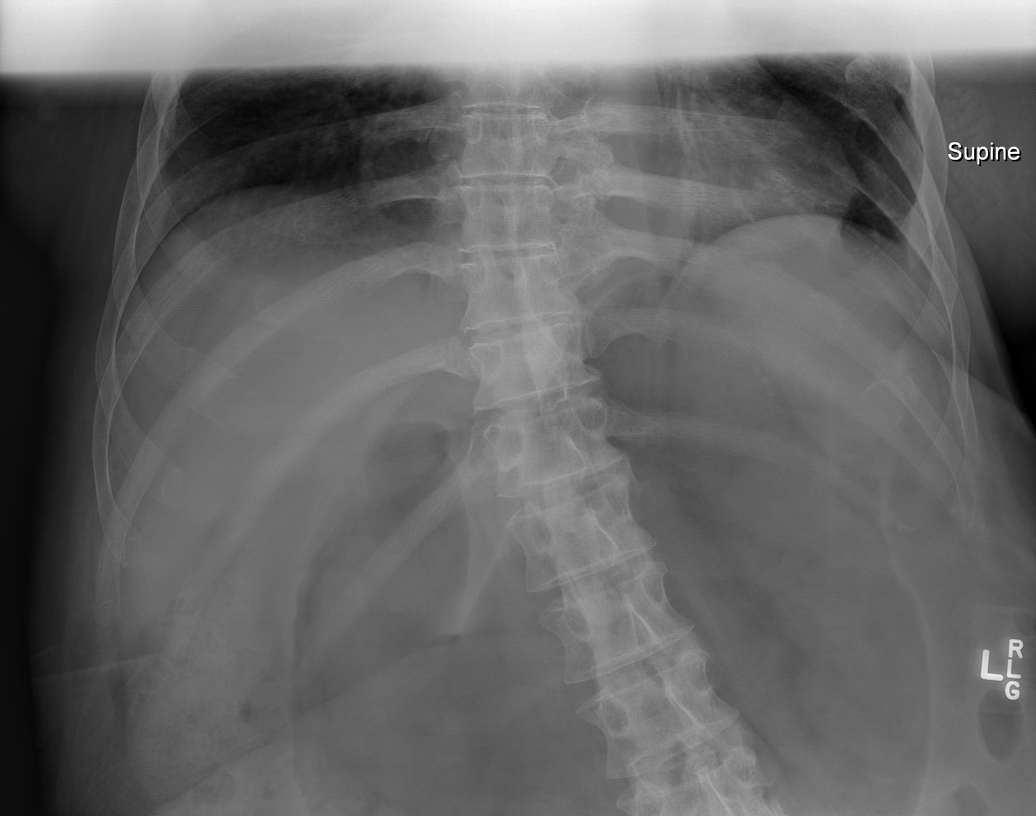

[w abdomen decub (1 of 3)]
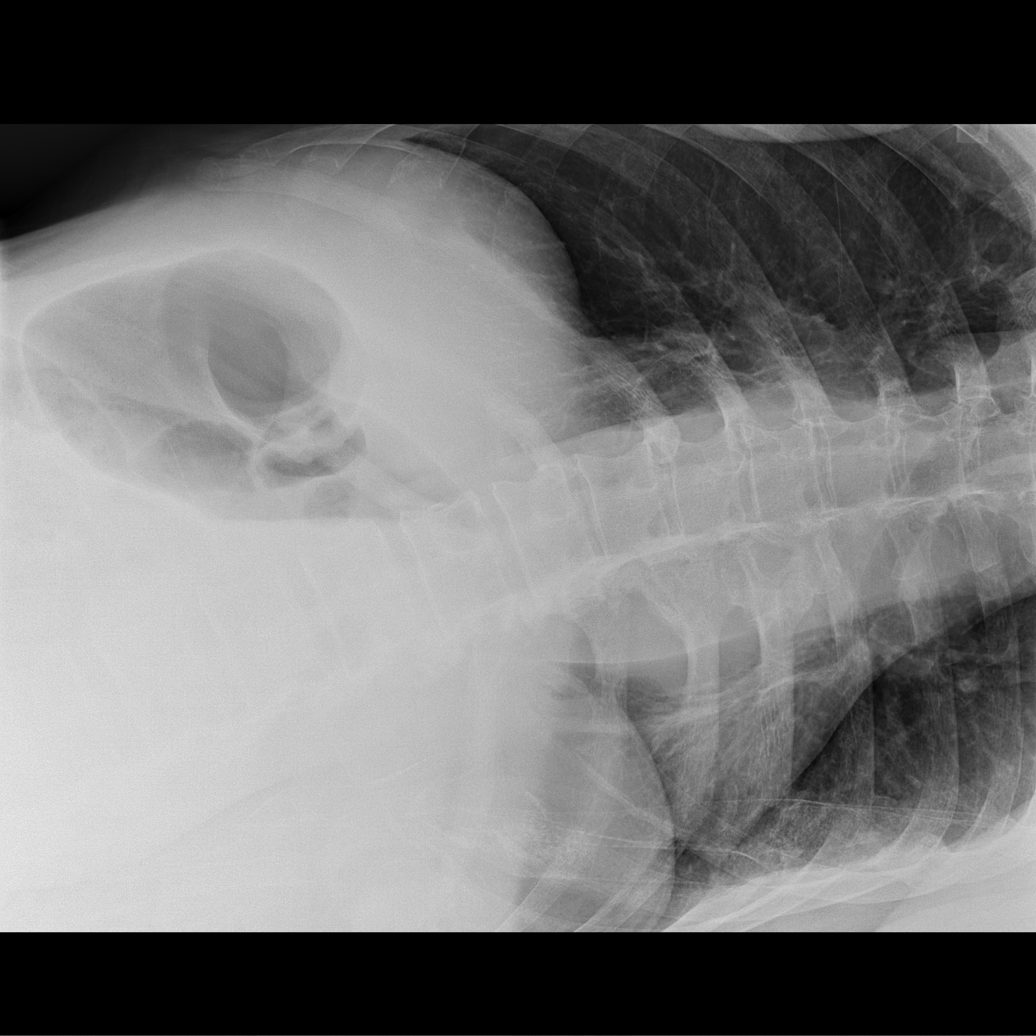

[w abdomen decub (2 of 3)]
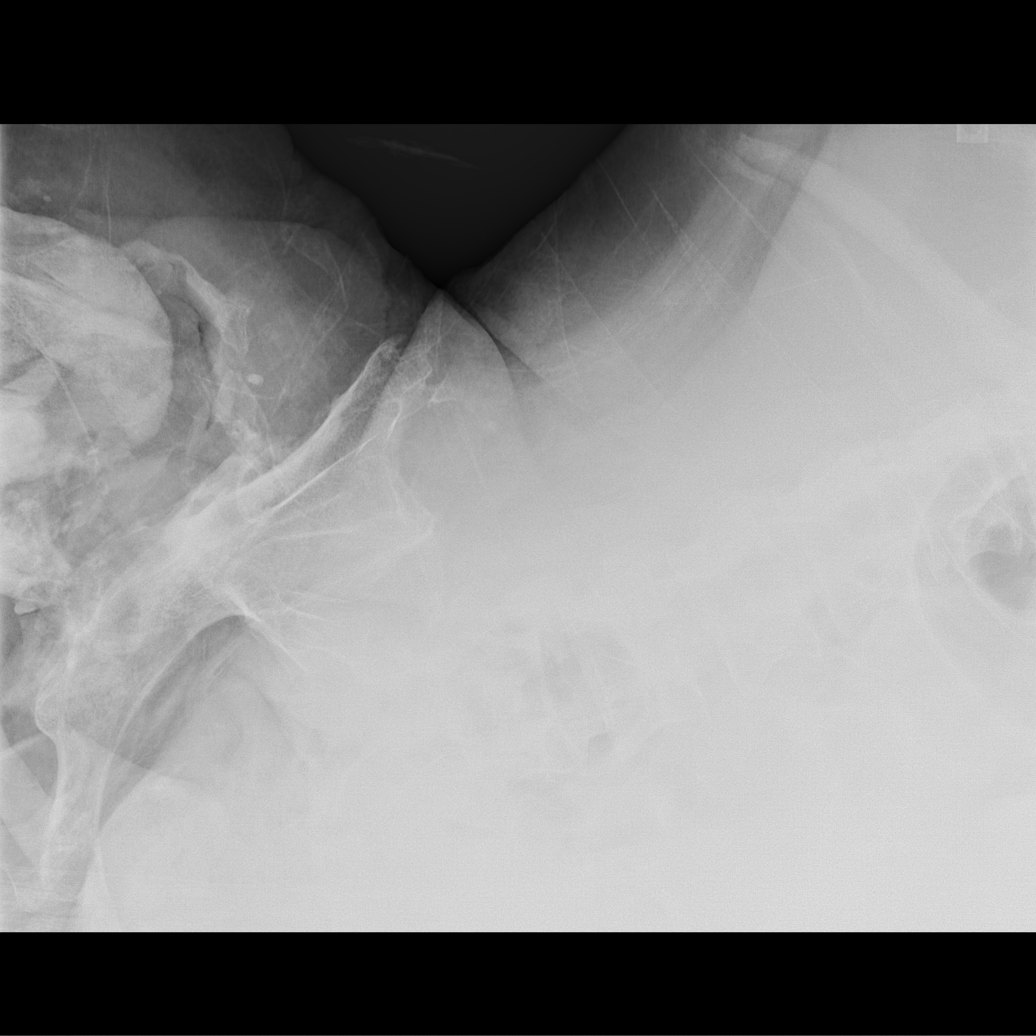

[w abdomen decub (3 of 3)]
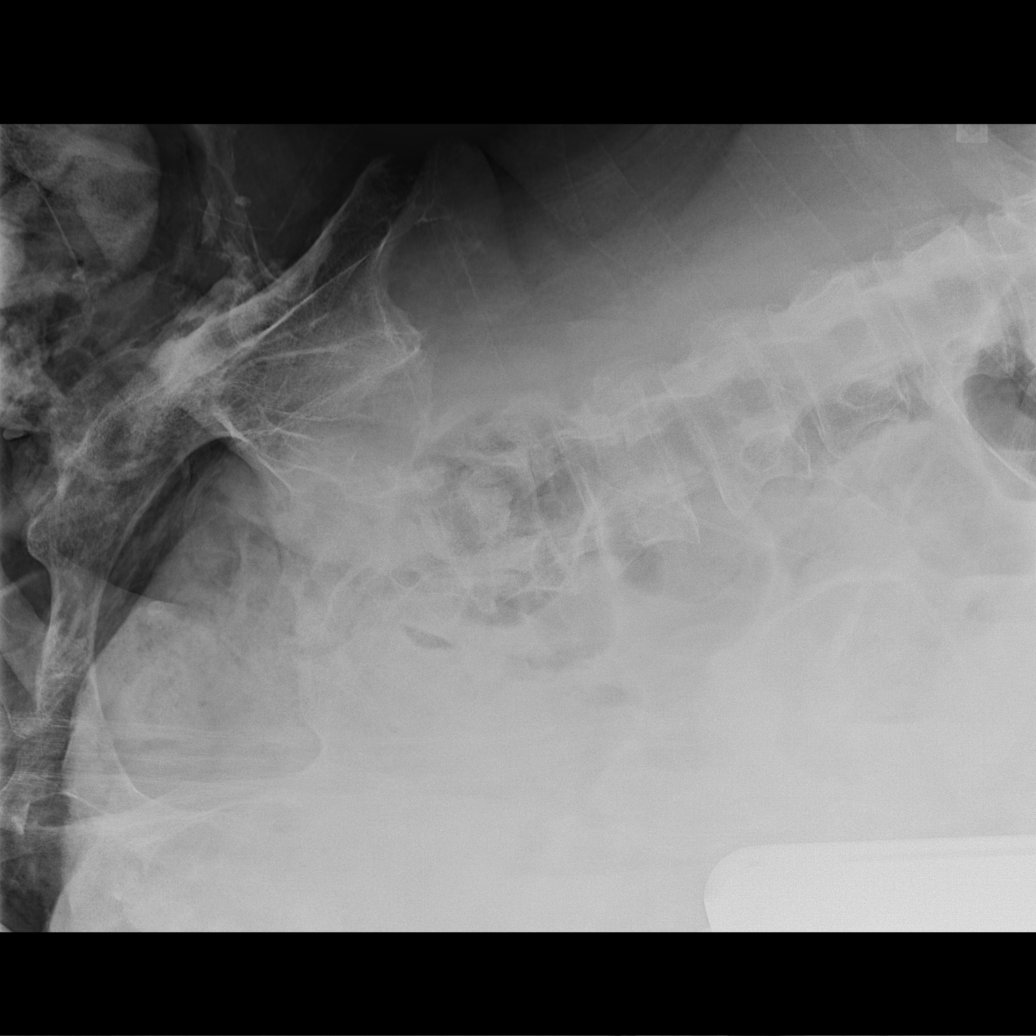

[6 of 6 positions shown; findings below may reference images not displayed]

FINDINGS: Cardiomediastinal silhouette is stable. No acute infiltrate or
pleural effusion. No pulmonary edema.

There is normal small bowel gas pattern. Moderate distension of the
stomach with fluid and gas. Extensive dystrophic degenerative
changes bilateral hip joints are again noted. There is no evidence
of free abdominal air.
IMPRESSION: No acute disease within chest. Moderate distension of the stomach
with fluid and gas. Normal small bowel gas pattern. Extensive
dystrophic degenerative changes bilateral hip joints.

## 2017-06-01 ENCOUNTER — Inpatient Hospital Stay (HOSPITAL_COMMUNITY)
Admission: EM | Admit: 2017-06-01 | Discharge: 2017-06-05 | DRG: 698 | Disposition: A | Discharge status: Home Health | Payer: Medicare Other | Attending: Internal Medicine | Admitting: Internal Medicine

## 2017-06-01 ENCOUNTER — Encounter (HOSPITAL_COMMUNITY): Payer: Self-pay | Admitting: *Deleted

## 2017-06-01 DIAGNOSIS — L89314 Pressure ulcer of right buttock, stage 4: Secondary | ICD-10-CM | POA: Diagnosis not present

## 2017-06-01 DIAGNOSIS — Z79899 Other long term (current) drug therapy: Secondary | ICD-10-CM | POA: Diagnosis not present

## 2017-06-01 DIAGNOSIS — M4620 Osteomyelitis of vertebra, site unspecified: Secondary | ICD-10-CM | POA: Diagnosis present

## 2017-06-01 DIAGNOSIS — I10 Essential (primary) hypertension: Secondary | ICD-10-CM | POA: Diagnosis not present

## 2017-06-01 DIAGNOSIS — Y838 Other surgical procedures as the cause of abnormal reaction of the patient, or of later complication, without mention of misadventure at the time of the procedure: Secondary | ICD-10-CM | POA: Diagnosis present

## 2017-06-01 DIAGNOSIS — I739 Peripheral vascular disease, unspecified: Secondary | ICD-10-CM | POA: Diagnosis present

## 2017-06-01 DIAGNOSIS — Z981 Arthrodesis status: Secondary | ICD-10-CM | POA: Diagnosis not present

## 2017-06-01 DIAGNOSIS — G4489 Other headache syndrome: Secondary | ICD-10-CM | POA: Diagnosis not present

## 2017-06-01 DIAGNOSIS — L8990 Pressure ulcer of unspecified site, unspecified stage: Secondary | ICD-10-CM

## 2017-06-01 DIAGNOSIS — T83518A Infection and inflammatory reaction due to other urinary catheter, initial encounter: Secondary | ICD-10-CM | POA: Diagnosis not present

## 2017-06-01 DIAGNOSIS — F329 Major depressive disorder, single episode, unspecified: Secondary | ICD-10-CM | POA: Diagnosis not present

## 2017-06-01 DIAGNOSIS — A419 Sepsis, unspecified organism: Secondary | ICD-10-CM | POA: Diagnosis present

## 2017-06-01 DIAGNOSIS — K219 Gastro-esophageal reflux disease without esophagitis: Secondary | ICD-10-CM | POA: Diagnosis present

## 2017-06-01 DIAGNOSIS — L89159 Pressure ulcer of sacral region, unspecified stage: Secondary | ICD-10-CM | POA: Diagnosis not present

## 2017-06-01 DIAGNOSIS — L89109 Pressure ulcer of unspecified part of back, unspecified stage: Secondary | ICD-10-CM | POA: Diagnosis present

## 2017-06-01 DIAGNOSIS — Z888 Allergy status to other drugs, medicaments and biological substances status: Secondary | ICD-10-CM

## 2017-06-01 DIAGNOSIS — Z881 Allergy status to other antibiotic agents status: Secondary | ICD-10-CM

## 2017-06-01 DIAGNOSIS — Z933 Colostomy status: Secondary | ICD-10-CM | POA: Diagnosis not present

## 2017-06-01 DIAGNOSIS — L89324 Pressure ulcer of left buttock, stage 4: Secondary | ICD-10-CM | POA: Diagnosis not present

## 2017-06-01 DIAGNOSIS — Z9989 Dependence on other enabling machines and devices: Secondary | ICD-10-CM

## 2017-06-01 DIAGNOSIS — Z87440 Personal history of urinary (tract) infections: Secondary | ICD-10-CM

## 2017-06-01 DIAGNOSIS — L89119 Pressure ulcer of right upper back, unspecified stage: Secondary | ICD-10-CM | POA: Diagnosis not present

## 2017-06-01 DIAGNOSIS — D638 Anemia in other chronic diseases classified elsewhere: Secondary | ICD-10-CM | POA: Diagnosis present

## 2017-06-01 DIAGNOSIS — N390 Urinary tract infection, site not specified: Secondary | ICD-10-CM | POA: Diagnosis present

## 2017-06-01 DIAGNOSIS — G4733 Obstructive sleep apnea (adult) (pediatric): Secondary | ICD-10-CM

## 2017-06-01 DIAGNOSIS — L89154 Pressure ulcer of sacral region, stage 4: Secondary | ICD-10-CM | POA: Diagnosis not present

## 2017-06-01 DIAGNOSIS — G8250 Quadriplegia, unspecified: Secondary | ICD-10-CM | POA: Diagnosis present

## 2017-06-01 LAB — URINALYSIS, ROUTINE W REFLEX MICROSCOPIC
Bilirubin Urine: NEGATIVE
Glucose, UA: NEGATIVE mg/dL
Ketones, ur: NEGATIVE mg/dL
NITRITE: NEGATIVE
PH: 5 (ref 5.0–8.0)
Protein, ur: 30 mg/dL — AB
SPECIFIC GRAVITY, URINE: 1.01 (ref 1.005–1.030)

## 2017-06-01 LAB — CBC WITH DIFFERENTIAL/PLATELET
BASOS ABS: 0 10*3/uL (ref 0.0–0.1)
BASOS PCT: 0 %
EOS ABS: 0.3 10*3/uL (ref 0.0–0.7)
EOS PCT: 3 %
HEMATOCRIT: 31.6 % — AB (ref 39.0–52.0)
Hemoglobin: 9.9 g/dL — ABNORMAL LOW (ref 13.0–17.0)
Lymphocytes Relative: 12 %
Lymphs Abs: 1.4 10*3/uL (ref 0.7–4.0)
MCH: 25 pg — ABNORMAL LOW (ref 26.0–34.0)
MCHC: 31.3 g/dL (ref 30.0–36.0)
MCV: 79.8 fL (ref 78.0–100.0)
MONO ABS: 1.5 10*3/uL — AB (ref 0.1–1.0)
Monocytes Relative: 13 %
NEUTROS ABS: 8.4 10*3/uL — AB (ref 1.7–7.7)
Neutrophils Relative %: 72 %
PLATELETS: 313 10*3/uL (ref 150–400)
RBC: 3.96 MIL/uL — ABNORMAL LOW (ref 4.22–5.81)
RDW: 16 % — AB (ref 11.5–15.5)
WBC: 11.7 10*3/uL — ABNORMAL HIGH (ref 4.0–10.5)

## 2017-06-01 LAB — COMPREHENSIVE METABOLIC PANEL
ALBUMIN: 2.9 g/dL — AB (ref 3.5–5.0)
ALK PHOS: 94 U/L (ref 38–126)
ALT: 20 U/L (ref 17–63)
ANION GAP: 6 (ref 5–15)
AST: 20 U/L (ref 15–41)
BILIRUBIN TOTAL: 0.4 mg/dL (ref 0.3–1.2)
BUN: 29 mg/dL — ABNORMAL HIGH (ref 6–20)
CALCIUM: 9 mg/dL (ref 8.9–10.3)
CO2: 21 mmol/L — ABNORMAL LOW (ref 22–32)
Chloride: 115 mmol/L — ABNORMAL HIGH (ref 101–111)
Creatinine, Ser: 0.57 mg/dL — ABNORMAL LOW (ref 0.61–1.24)
GFR calc Af Amer: >60 mL/min (ref >60)
GFR calc non Af Amer: >60 mL/min (ref >60)
GLUCOSE: 107 mg/dL — AB (ref 65–99)
Potassium: 3.7 mmol/L (ref 3.5–5.1)
Sodium: 142 mmol/L (ref 135–145)
TOTAL PROTEIN: 8.3 g/dL — AB (ref 6.5–8.1)

## 2017-06-01 LAB — I-STAT CG4 LACTIC ACID, ED: LACTIC ACID, VENOUS: 1.52 mmol/L (ref 0.5–1.9)

## 2017-06-01 MED ORDER — SODIUM CHLORIDE 0.9 % IV BOLUS (SEPSIS)
1000.0000 mL | Freq: Once | INTRAVENOUS | Status: AC
  Administered 2017-06-01: 1000 mL via INTRAVENOUS

## 2017-06-01 MED ORDER — PIPERACILLIN-TAZOBACTAM 3.375 G IVPB 30 MIN
3.3750 g | Freq: Once | INTRAVENOUS | Status: AC
  Administered 2017-06-01: 3.375 g via INTRAVENOUS
  Filled 2017-06-01: qty 50
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
## 2017-06-01 NOTE — ED Triage Notes (Signed)
Per EMS, pt has had odorous urine for the past 2 days. Pt states he felt chills today. Pt also complains of pressure ulcer on his buttocks and right flank.

## 2017-06-01 NOTE — ED Notes (Signed)
Bed: WA17 Expected date:  Expected time:  Means of arrival:  Comments: 

## 2017-06-02 ENCOUNTER — Encounter (HOSPITAL_BASED_OUTPATIENT_CLINIC_OR_DEPARTMENT_OTHER): Payer: Medicare Other | Attending: Internal Medicine

## 2017-06-02 ENCOUNTER — Other Ambulatory Visit: Payer: Self-pay

## 2017-06-02 DIAGNOSIS — N390 Urinary tract infection, site not specified: Secondary | ICD-10-CM

## 2017-06-02 DIAGNOSIS — T83518A Infection and inflammatory reaction due to other urinary catheter, initial encounter: Secondary | ICD-10-CM | POA: Diagnosis present

## 2017-06-02 DIAGNOSIS — G8250 Quadriplegia, unspecified: Secondary | ICD-10-CM | POA: Diagnosis not present

## 2017-06-02 DIAGNOSIS — Z87440 Personal history of urinary (tract) infections: Secondary | ICD-10-CM | POA: Diagnosis not present

## 2017-06-02 DIAGNOSIS — L89314 Pressure ulcer of right buttock, stage 4: Secondary | ICD-10-CM | POA: Diagnosis present

## 2017-06-02 DIAGNOSIS — I739 Peripheral vascular disease, unspecified: Secondary | ICD-10-CM | POA: Diagnosis present

## 2017-06-02 DIAGNOSIS — Z881 Allergy status to other antibiotic agents status: Secondary | ICD-10-CM | POA: Diagnosis not present

## 2017-06-02 DIAGNOSIS — Z888 Allergy status to other drugs, medicaments and biological substances status: Secondary | ICD-10-CM | POA: Diagnosis not present

## 2017-06-02 DIAGNOSIS — L89324 Pressure ulcer of left buttock, stage 4: Secondary | ICD-10-CM | POA: Diagnosis present

## 2017-06-02 DIAGNOSIS — Y838 Other surgical procedures as the cause of abnormal reaction of the patient, or of later complication, without mention of misadventure at the time of the procedure: Secondary | ICD-10-CM | POA: Diagnosis present

## 2017-06-02 DIAGNOSIS — L8990 Pressure ulcer of unspecified site, unspecified stage: Secondary | ICD-10-CM | POA: Diagnosis not present

## 2017-06-02 DIAGNOSIS — Z933 Colostomy status: Secondary | ICD-10-CM | POA: Diagnosis not present

## 2017-06-02 DIAGNOSIS — Z79899 Other long term (current) drug therapy: Secondary | ICD-10-CM | POA: Diagnosis not present

## 2017-06-02 DIAGNOSIS — F329 Major depressive disorder, single episode, unspecified: Secondary | ICD-10-CM | POA: Diagnosis present

## 2017-06-02 DIAGNOSIS — Z981 Arthrodesis status: Secondary | ICD-10-CM | POA: Diagnosis not present

## 2017-06-02 DIAGNOSIS — D638 Anemia in other chronic diseases classified elsewhere: Secondary | ICD-10-CM

## 2017-06-02 DIAGNOSIS — Z9989 Dependence on other enabling machines and devices: Secondary | ICD-10-CM

## 2017-06-02 DIAGNOSIS — G4733 Obstructive sleep apnea (adult) (pediatric): Secondary | ICD-10-CM

## 2017-06-02 DIAGNOSIS — A419 Sepsis, unspecified organism: Secondary | ICD-10-CM

## 2017-06-02 DIAGNOSIS — K219 Gastro-esophageal reflux disease without esophagitis: Secondary | ICD-10-CM | POA: Diagnosis present

## 2017-06-02 DIAGNOSIS — M4620 Osteomyelitis of vertebra, site unspecified: Secondary | ICD-10-CM | POA: Diagnosis present

## 2017-06-02 DIAGNOSIS — L89154 Pressure ulcer of sacral region, stage 4: Secondary | ICD-10-CM | POA: Diagnosis not present

## 2017-06-02 DIAGNOSIS — L89119 Pressure ulcer of right upper back, unspecified stage: Secondary | ICD-10-CM | POA: Diagnosis not present

## 2017-06-02 DIAGNOSIS — I10 Essential (primary) hypertension: Secondary | ICD-10-CM | POA: Diagnosis present

## 2017-06-02 DIAGNOSIS — L89109 Pressure ulcer of unspecified part of back, unspecified stage: Secondary | ICD-10-CM | POA: Diagnosis present

## 2017-06-02 DIAGNOSIS — L89159 Pressure ulcer of sacral region, unspecified stage: Secondary | ICD-10-CM | POA: Diagnosis not present

## 2017-06-02 LAB — CBC
HEMATOCRIT: 28.1 % — AB (ref 39.0–52.0)
HEMOGLOBIN: 8.5 g/dL — AB (ref 13.0–17.0)
MCH: 24.2 pg — ABNORMAL LOW (ref 26.0–34.0)
MCHC: 30.2 g/dL (ref 30.0–36.0)
MCV: 80.1 fL (ref 78.0–100.0)
Platelets: 271 10*3/uL (ref 150–400)
RBC: 3.51 MIL/uL — ABNORMAL LOW (ref 4.22–5.81)
RDW: 16.2 % — ABNORMAL HIGH (ref 11.5–15.5)
WBC: 10.1 10*3/uL (ref 4.0–10.5)

## 2017-06-02 LAB — BASIC METABOLIC PANEL
ANION GAP: 4 — AB (ref 5–15)
BUN: 19 mg/dL (ref 6–20)
CHLORIDE: 119 mmol/L — AB (ref 101–111)
CO2: 21 mmol/L — AB (ref 22–32)
CREATININE: 0.36 mg/dL — AB (ref 0.61–1.24)
Calcium: 8.7 mg/dL — ABNORMAL LOW (ref 8.9–10.3)
GFR calc non Af Amer: >60 mL/min (ref >60)
GLUCOSE: 92 mg/dL (ref 65–99)
Potassium: 3.1 mmol/L — ABNORMAL LOW (ref 3.5–5.1)
Sodium: 144 mmol/L (ref 135–145)

## 2017-06-02 MED ORDER — SODIUM CHLORIDE 0.9% FLUSH
10.0000 mL | Freq: Two times a day (BID) | INTRAVENOUS | Status: DC
  Administered 2017-06-02 – 2017-06-05 (×2): 10 mL

## 2017-06-02 MED ORDER — SODIUM CHLORIDE 0.9% FLUSH: 3.0000 mL | Freq: Two times a day (BID) | INTRAVENOUS | Status: DC

## 2017-06-02 MED ORDER — ENOXAPARIN SODIUM 40 MG/0.4ML ~~LOC~~ SOLN
40.0000 mg | SUBCUTANEOUS | Status: DC
  Administered 2017-06-02 – 2017-06-05 (×4): 40 mg via SUBCUTANEOUS
  Filled 2017-06-02 (×4): qty 0.4

## 2017-06-02 MED ORDER — IPRATROPIUM-ALBUTEROL 0.5-2.5 (3) MG/3ML IN SOLN: 3.0000 mL | RESPIRATORY_TRACT | Status: DC | PRN

## 2017-06-02 MED ORDER — ONDANSETRON HCL 4 MG/2ML IJ SOLN: 4.0000 mg | Freq: Four times a day (QID) | INTRAMUSCULAR | Status: DC | PRN

## 2017-06-02 MED ORDER — JUVEN PO PACK
1.0000 | PACK | Freq: Two times a day (BID) | ORAL | Status: DC
  Administered 2017-06-02 – 2017-06-05 (×7): 1 via ORAL
  Filled 2017-06-02 (×7): qty 1

## 2017-06-02 MED ORDER — ACETAMINOPHEN 325 MG PO TABS: 650.0000 mg | ORAL_TABLET | Freq: Four times a day (QID) | ORAL | Status: DC | PRN

## 2017-06-02 MED ORDER — ONDANSETRON HCL 4 MG PO TABS: 4.0000 mg | ORAL_TABLET | Freq: Four times a day (QID) | ORAL | Status: DC | PRN

## 2017-06-02 MED ORDER — FERROUS SULFATE 325 (65 FE) MG PO TABS
325.0000 mg | ORAL_TABLET | Freq: Three times a day (TID) | ORAL | Status: DC
  Administered 2017-06-02 – 2017-06-05 (×12): 325 mg via ORAL
  Filled 2017-06-02 (×12): qty 1

## 2017-06-02 MED ORDER — POTASSIUM CHLORIDE CRYS ER 20 MEQ PO TBCR
40.0000 meq | EXTENDED_RELEASE_TABLET | Freq: Once | ORAL | Status: AC
  Administered 2017-06-02: 40 meq via ORAL
  Filled 2017-06-02: qty 2

## 2017-06-02 MED ORDER — SODIUM CHLORIDE 0.9% FLUSH: 10.0000 mL | INTRAVENOUS | Status: DC | PRN

## 2017-06-02 MED ORDER — BACLOFEN 20 MG PO TABS
20.0000 mg | ORAL_TABLET | Freq: Four times a day (QID) | ORAL | Status: DC
  Administered 2017-06-02 – 2017-06-05 (×15): 20 mg via ORAL
  Filled 2017-06-02 (×15): qty 1

## 2017-06-02 MED ORDER — SUCRALFATE 1 G PO TABS
1.0000 g | ORAL_TABLET | Freq: Four times a day (QID) | ORAL | Status: DC
  Administered 2017-06-02 – 2017-06-05 (×15): 1 g via ORAL
  Filled 2017-06-02 (×15): qty 1

## 2017-06-02 MED ORDER — PIPERACILLIN-TAZOBACTAM 3.375 G IVPB
3.3750 g | Freq: Three times a day (TID) | INTRAVENOUS | Status: DC
  Administered 2017-06-02 – 2017-06-05 (×10): 3.375 g via INTRAVENOUS
  Filled 2017-06-02 (×10): qty 50

## 2017-06-02 MED ORDER — MAGNESIUM OXIDE 400 (241.3 MG) MG PO TABS
400.0000 mg | ORAL_TABLET | Freq: Every day | ORAL | Status: DC
Start: 2017-06-02 — End: 2017-06-05
  Administered 2017-06-02 – 2017-06-05 (×4): 400 mg via ORAL
  Filled 2017-06-02 (×4): qty 1

## 2017-06-02 MED ORDER — SILVER SULFADIAZINE 1 % EX CREA
TOPICAL_CREAM | Freq: Two times a day (BID) | CUTANEOUS | Status: DC
  Administered 2017-06-02 – 2017-06-05 (×6): via TOPICAL
  Filled 2017-06-02: qty 50

## 2017-06-02 MED ORDER — SODIUM CHLORIDE 0.9 % IV SOLN: INTRAVENOUS | Status: DC

## 2017-06-02 MED ORDER — PANTOPRAZOLE SODIUM 40 MG PO TBEC
80.0000 mg | DELAYED_RELEASE_TABLET | Freq: Two times a day (BID) | ORAL | Status: DC
  Administered 2017-06-02 – 2017-06-05 (×8): 80 mg via ORAL
  Filled 2017-06-02 (×8): qty 2

## 2017-06-02 MED ORDER — FESOTERODINE FUMARATE ER 8 MG PO TB24
8.0000 mg | ORAL_TABLET | Freq: Every day | ORAL | Status: DC
  Administered 2017-06-02 – 2017-06-05 (×4): 8 mg via ORAL
  Filled 2017-06-02 (×4): qty 1

## 2017-06-02 MED ORDER — ACETAMINOPHEN 650 MG RE SUPP: 650.0000 mg | Freq: Four times a day (QID) | RECTAL | Status: DC | PRN

## 2017-06-02 MED ORDER — ADULT MULTIVITAMIN W/MINERALS CH
1.0000 | ORAL_TABLET | Freq: Every day | ORAL | Status: DC
  Administered 2017-06-02 – 2017-06-05 (×4): 1 via ORAL
  Filled 2017-06-02 (×4): qty 1

## 2017-06-02 NOTE — Progress Notes (Signed)
   06/02/17 1400  OT Visit Information  Last OT Received On 06/02/17  History of Present Illness Pt is a 52 year old man with longstanding C5 quadriplegia.  Pt admitted with complicated UTI  Precautions  Precautions None  Pain Assessment  Pain Assessment No/denies pain  Cognition  Arousal/Alertness Awake/alert  Behavior During Therapy WFL for tasks assessed/performed  Overall Cognitive Status Within Functional Limits for tasks assessed  ADL  Overall ADL's  Needs assistance/impaired  Eating/Feeding Bed level;Minimal assistance (with min A to reposition items)  General ADL Comments Provided new splint with universal cuff and strapped red foam perpendicular to U-cuff with fork.  Pt able to self feed when plate is placed on his chest. This is his baseline.  This set up is not as secure as what he has. NT in room and showed how this was adapted.  (strap is wrapped around red foam, velcroed together then strap around hand (cut out near 5th PIP) and velcro'd on top. Additionally gave him a lidded cup. He is used to drinking after eating with sister helping.  If cup is positioned correctly, he can drink on his own once he is finished with meal.  U cuff holds stylus for call bell and phone. He does not want a soft touch button as he cannot be sure whether he is activating it or not due to decreased sensation. Checked splint at 1540 and he is tolerating well (2+ hours later)  OT - End of Session  Activity Tolerance Patient tolerated treatment well  Patient left in bed  OT Assessment/Plan  Follow Up Recommendations Supervision/Assistance - 24 hour  OT Equipment None recommended by OT  AM-PAC OT "6 Clicks" Daily Activity Outcome Measure  Help from another person eating meals? 3  Help from another person taking care of personal grooming? 1  Help from another person toileting, which includes using toliet, bedpan, or urinal? 1  Help from another person bathing (including washing, rinsing, drying)? 1   Help from another person to put on and taking off regular upper body clothing? 1  Help from another person to put on and taking off regular lower body clothing? 1  6 Click Score 8  ADL G Code Conversion CM  OT Time Calculation  OT Start Time (ACUTE ONLY) 1305  OT Stop Time (ACUTE ONLY) 1325  OT Time Calculation (min) 20 min  OT General Charges  $OT Visit 1 Visit  OT Treatments  $Self Care/Home Management  8-22 mins  Lesle Chris, OTR/L [phone] 06/02/2017

## 2017-06-02 NOTE — Progress Notes (Signed)
Pharmacy Antibiotic Note  [name] [name] is a 52 y.o. male admitted on 06/01/2017 with sepsis.  Pharmacy has been consulted for zosyn dosing.  Plan: Zosyn 3.375g IV q8h (4 hour infusion).     Temp (24hrs), Avg:99.1 F (37.3 C), Min:99.1 F (37.3 C), Max:99.1 F (37.3 C)  Recent Labs  Lab 06/01/17 1839 06/01/17 1956  WBC 11.7*  --   CREATININE 0.57*  --   LATICACIDVEN  --  1.52    CrCl cannot be calculated (Unknown ideal weight.).    Allergies  Allergen Reactions  . Feraheme [Ferumoxytol] Other (See Comments)    SYNCOPE  . Ditropan [Oxybutynin] Other (See Comments)    Hallucinations   . Vancomycin Other (See Comments)    ARF 05-2016 -- affects kidneys Has patient had a PCN reaction causing immediate rash, facial/tongue/throat swelling, SOB or lightheadedness with hypotension: Unk Has patient had a PCN reaction causing severe rash involving mucus membranes or skin necrosis: Unk Has patient had a PCN reaction that required hospitalization: Unk Has patient had a PCN reaction occurring within the last 10 years: Unk If all of the above answers are "NO", then may proceed with Cephalosporin use.     Antimicrobials this admission: Zosyn 06/02/2017 >>   Dose adjustments this admission: -  Microbiology results: pending  Thank you for allowing pharmacy to be a part of this patient's care.  Nani Skillern Crowford 06/02/2017 5:55 AM

## 2017-06-02 NOTE — Consult Note (Signed)
[place] Nurse wound consult note Reason for Consult: Well known to [facility] team.  Multiple chronic, nonhealing full thickness pressure injuries.  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  Stage 4 pressure injury left ischium: 11cm x 5cm x 0.2cm: 100% pale, clean, pink, non granular, slick in appearance Stage 4 pressure injury left ischium; 2cm x 4cm x 0.1cm: 100% pale, clean, pink, non granular Stage 4 pressure injury right ischium: 1.5cm x 3cm x 6cm: unable to visualize wound bed  Stage 4 pressure injury sacrum; 11cm x 2cm x 0.1cm: 100% pale, clean, pink, non granular  Full thickness skin loss 2nd finger: 1.0cm x 3.0cm x 0.2cm; 100% yellow/green slough material Partial thickness skin loss 3rd finger; 1cm x 1cm x 0.1cm: 100% clean, pink, dry Full thickness skin loss right medial foot secondary to trauma from ill fitting shoes: 2.5cm x 2.0cm x 0.1cm: 100% pink, dry clean  Unstageable pressure injury: right lateral chest: 11cm x 5cm x 0.2cm; 80% pink with gray devitalized tissue to perimeter at 2 o'clock Pressure Injury POA: Yes x 5 Measurement: see above  Wound bed: see above  Drainage (amount, consistency, odor) sacral and ischial wounds are clean and have heavy, serosanguinous drainage consistent with previous admissions. Periwound: intact with evidence of previous skin breakdown  Dressing procedure/placement/frequency: Aquacel Ag  to absorb exudate on the sacral and ischial wounds, findings consistent with  Contamination and  bioburden. Silvadene cream to the blistering onthe fingers and abdominal wounds due to the appearance of the drainage today.   Aquacel AG to the right lateral chest wound  Will not follow at this time.  Please re-consult if needed.  Domenic Moras RN BSN [place] Pager [phone]

## 2017-06-02 NOTE — Progress Notes (Signed)
Initial Nutrition Assessment  DOCUMENTATION CODES:   Not applicable  INTERVENTION:    Provide MVI daily  Juven Fruit Punch BID, each serving provides 80kcal and 14g of protein (amino acids glutamine and arginine)  NUTRITION DIAGNOSIS:   Increased nutrient needs related to wound healing as evidenced by estimated needs.   GOAL:   Patient will meet greater than or equal to 90% of their needs  MONITOR:   Labs, PO intake, Supplement acceptance, Weight trends  REASON FOR ASSESSMENT:   Malnutrition Screening Tool    ASSESSMENT:   Pt with PMH significant for quadriplegia with colostomy, osteomyelitis, recurrent UTIs, and pressure ulcers. Presents this admission with complaints of foul smelling urine and increased drainage from his back wound. Admitted for complicated UTI. Pt is seen by wound clinic every two weeks for chronic stage IV sacral decubitus.    Spoke with pt at bedside. Reports having stable appetite prior to admission consuming three meals per day with snacks. Pt aware of increased need for protein in regard to wound healing and tries to stick to lean protein like fish, chicken, and [place]. Pt drinks Juven twice per day at home and is amendable to them this stay. Pt eat 100% of breakfast the morning without any complication.   Pt reports a UBW of 200 lb. Denies any recent wt loss. Records indicate pt has maintained his weight of 198-202 lb since 08/2016. Do not suspect any significant weight loss.   Nutrition-Focused physical exam completed. Pt wheel chair bound and shows muscle atrophy in bilateral lower extremities which is to be expected given immobility.   Medications reviewed and include: ferrous sulfate, Mag oxide, NS @ 40 ml/hr, IV abx Labs reviewed: K 3.1 (L)   NUTRITION - FOCUSED PHYSICAL EXAM:    Most Recent Value  Orbital Region  No depletion  Upper Arm Region  No depletion  Thoracic and Lumbar Region  Unable to assess  Buccal Region  No depletion   Temple Region  No depletion  Clavicle Bone Region  No depletion  Clavicle and Acromion Bone Region  No depletion  Scapular Bone Region  Unable to assess  Dorsal Hand  No depletion  Patellar Region  No depletion  Anterior Thigh Region  No depletion  Posterior Calf Region  No depletion  Edema (RD Assessment)  None  Hair  Reviewed  Eyes  Reviewed  Mouth  Reviewed  Skin  Reviewed  Nails  Reviewed     Diet Order:  Diet Heart Room service appropriate? Yes; Fluid consistency: Thin  EDUCATION NEEDS:   Not appropriate for education at this time  Skin:  Skin Assessment: Skin Integrity Issues: Skin Integrity Issues:: Stage IV, Other (Comment) Stage IV: sacrum Other: opn wound right ischial tuberosity, right back, right foot  Last BM:  06/02/17  Height:   Ht Readings from Last 1 Encounters:  06/02/17 6' (1.829 m)    Weight:   Wt Readings from Last 1 Encounters:  06/02/17 198 lb (89.8 kg)    Ideal Body Weight:  80.9 kg  BMI:  Body mass index is 26.85 kg/m.  Estimated Nutritional Needs:   Kcal:  2100-2300 kcal/day  Protein:  130-140 g/day  Fluid:  >2.1 L/day    Mariana Single RD, LDN Clinical Nutrition Pager # - [phone]

## 2017-06-02 NOTE — Progress Notes (Signed)
Pharmacy Antibiotic Note  [name] [name] is a 52 y.o. male admitted on 06/01/2017 with sepsis.  Patient with history of multiple UTIs.  Pharmacy has been consulted for zosyn dosing.  Tmax 99.1 WBC 10.1, improved SCr 0.36, stable CrCl > 100 ml/min Lactate 1.52  Plan: Zosyn 3.375g IV q8h (4 hour infusion).  No further adjustments needed Pharmacy will sign off, please reconsult if further assistance needed  Height: 6' (182.9 cm) Weight: 198 lb (89.8 kg) IBW/kg (Calculated) : 77.6  Temp (24hrs), Avg:98.9 F (37.2 C), Min:98.7 F (37.1 C), Max:99.1 F (37.3 C)  Recent Labs  Lab 06/01/17 1839 06/01/17 1956 06/02/17 0611  WBC 11.7*  --  10.1  CREATININE 0.57*  --  0.36*  LATICACIDVEN  --  1.52  --     Estimated Creatinine Clearance: 121.3 mL/min (A) (by C-G formula based on SCr of 0.36 mg/dL (L)).    Allergies  Allergen Reactions  . Feraheme [Ferumoxytol] Other (See Comments)    SYNCOPE  . Ditropan [Oxybutynin] Other (See Comments)    Hallucinations   . Vancomycin Other (See Comments)    ARF 05-2016 -- affects kidneys Has patient had a PCN reaction causing immediate rash, facial/tongue/throat swelling, SOB or lightheadedness with hypotension: Unk Has patient had a PCN reaction causing severe rash involving mucus membranes or skin necrosis: Unk Has patient had a PCN reaction that required hospitalization: Unk Has patient had a PCN reaction occurring within the last 10 years: Unk If all of the above answers are "NO", then may proceed with Cephalosporin use.     Antimicrobials this admission: Zosyn 06/02/2017 >>   Dose adjustments this admission: -  Microbiology results: 1/18 BCx: sent 1/18 UCx: sent  Thank you for allowing pharmacy to be a part of this patient's care.  Peggyann Juba, PharmD, BCPS Pager: [phone] 06/02/2017 10:06 AM
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
## 2017-06-02 NOTE — Evaluation (Signed)
Occupational Therapy Evaluation Patient Details Name: [name] [name] MRN: [id number] DOB: [birth year] Today's Date: 06/02/2017    History of Present Illness Pt is a 52 year old man with longstanding C5 quadriplegia.  Pt admitted with complicated UTI   Clinical Impression   Pt was admitted for the above. At baseline, he can feed himself but not drink independently. Will try to provide AE so that pt can return to PLOF for self feeding    Follow Up Recommendations  Supervision/Assistance - 24 hour    Equipment Recommendations  None recommended by OT    Recommendations for Other Services       Precautions / Restrictions Precautions Precautions: None      Mobility Bed Mobility                  Transfers                      Balance                                           ADL either performed or assessed with clinical judgement   ADL                                         General ADL Comments: OT consulted for feeding. Pt has a universal cuff at home with a t-bar to stabilize.  He wears a long splint with universal cuff built in.Of note, velcro is worn out on top strap and this is typically made for opposite arm (R) Pt is unable to supinate. He usually uses a t-cuff to hold utensil on top of hand perpendicular to u-cuff.  We do not have anything like this. Will return to try different options.  Pt's LUE is dominant arm     Vision         Perception     Praxis      Pertinent Vitals/Pain Pain Assessment: No/denies pain     Hand Dominance     Extremity/Trunk Assessment Upper Extremity Assessment Upper Extremity Assessment: (pt with c5 quadriplegia; does not have functional tenodesis )           Communication Communication Communication: No difficulties   Cognition Arousal/Alertness: Awake/alert Behavior During Therapy: WFL for tasks assessed/performed Overall Cognitive Status: Within Functional Limits  for tasks assessed                                     General Comments       Exercises     Shoulder Instructions      Home Living Family/patient expects to be discharged to:: Private residence Living Arrangements: Other relatives                               Additional Comments: has hoyer      Prior Functioning/Environment          Comments: assist for all adls; set up/min A for food. He positions it on chest; sister assists with drink after he eats        OT Problem List: Impaired UE functional use  OT Treatment/Interventions: Self-care/ADL training;DME and/or AE instruction    OT Goals(Current goals can be found in the care plan section) Acute Rehab OT Goals Patient Stated Goal: be able to feed himself OT Goal Formulation: With patient Time For Goal Achievement: 06/04/17 Potential to Achieve Goals: Good  OT Frequency: Min 2X/week   Barriers to D/C:            Co-evaluation              AM-PAC PT "6 Clicks" Daily Activity     Outcome Measure Help from another person eating meals?: Total Help from another person taking care of personal grooming?: Total Help from another person toileting, which includes using toliet, bedpan, or urinal?: Total Help from another person bathing (including washing, rinsing, drying)?: Total Help from another person to put on and taking off regular upper body clothing?: Total Help from another person to put on and taking off regular lower body clothing?: Total 6 Click Score: 6   End of Session    Activity Tolerance: Patient tolerated treatment well Patient left: in bed  OT Visit Diagnosis: Muscle weakness (generalized) (M62.81)                Time: 2542-7062 OT Time Calculation (min): 10 min Charges:  OT General Charges $OT Visit: 1 Visit OT Evaluation $OT Eval Low Complexity: 1 Low G-Codes:     [name] [name], OTR/L [phone] 06/02/2017  [name] [name] 06/02/2017, 2:53  PM

## 2017-06-03 LAB — URINE CULTURE: SPECIAL REQUESTS: NORMAL

## 2017-06-03 LAB — BASIC METABOLIC PANEL
Anion gap: 6 (ref 5–15)
BUN: 14 mg/dL (ref 6–20)
CO2: 21 mmol/L — ABNORMAL LOW (ref 22–32)
CREATININE: 0.51 mg/dL — AB (ref 0.61–1.24)
Calcium: 8.8 mg/dL — ABNORMAL LOW (ref 8.9–10.3)
Chloride: 113 mmol/L — ABNORMAL HIGH (ref 101–111)
GFR calc Af Amer: >60 mL/min (ref >60)
GLUCOSE: 103 mg/dL — AB (ref 65–99)
POTASSIUM: 3.4 mmol/L — AB (ref 3.5–5.1)
Sodium: 140 mmol/L (ref 135–145)

## 2017-06-03 LAB — MAGNESIUM: Magnesium: 1.2 mg/dL — ABNORMAL LOW (ref 1.7–2.4)
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
## 2017-06-04 LAB — URINE CULTURE
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
## 2017-06-05 MED ORDER — MAGNESIUM SULFATE 2 GM/50ML IV SOLN
2.0000 g | Freq: Once | INTRAVENOUS | Status: AC
  Administered 2017-06-05: 2 g via INTRAVENOUS
  Filled 2017-06-05: qty 50

## 2017-06-05 MED ORDER — CEFEPIME IV (FOR PTA / DISCHARGE USE ONLY): 2.0000 g | Freq: Two times a day (BID) | INTRAVENOUS | 0 refills | Status: AC

## 2017-06-05 MED ORDER — HEPARIN SOD (PORK) LOCK FLUSH 100 UNIT/ML IV SOLN
500.0000 [IU] | INTRAVENOUS | Status: AC | PRN
  Administered 2017-06-05: 500 [IU]

## 2017-06-05 MED ORDER — DEXTROSE 5 % IV SOLN
2.0000 g | Freq: Two times a day (BID) | INTRAVENOUS | Status: DC
  Administered 2017-06-05: 2 g via INTRAVENOUS
  Filled 2017-06-05 (×2): qty 2

## 2017-06-05 NOTE — Discharge Instructions (Signed)
Follow with Primary MD [name], MD in 7 days   Get CBC, CMP, 2 view Chest X ray checked  by Primary MD in 5-7 days    Activity: As tolerated with Full fall precautions use walker/cane & assistance as needed  Disposition Home    Diet:  Heart Healthy   For Heart failure patients - Check your Weight same time everyday, if you gain over 2 pounds, or you develop in leg swelling, experience more shortness of breath or chest pain, call your Primary MD immediately. Follow Cardiac Low Salt Diet and 1.5 lit/day fluid restriction.  On your next visit with your primary care physician please Get Medicines reviewed and adjusted.  Please request your Prim.MD to go over all Hospital Tests and Procedure/Radiological results at the follow up, please get all Hospital records sent to your Prim MD by signing hospital release before you go home.  If you experience worsening of your admission symptoms, develop shortness of breath, life threatening emergency, suicidal or homicidal thoughts you must seek medical attention immediately by calling 911 or calling your MD immediately  if symptoms less severe.  You Must read complete instructions/literature along with all the possible adverse reactions/side effects for all the Medicines you take and that have been prescribed to you. Take any new Medicines after you have completely understood and accpet all the possible adverse reactions/side effects.   Do not drive, operate heavy machinery, perform activities at heights, swimming or participation in water activities or provide baby sitting services if your were admitted for syncope or siezures until you have seen by Primary MD or a Neurologist and advised to do so again.  Do not drive when taking Pain medications.    Do not take more than prescribed Pain, Sleep and Anxiety Medications  Special Instructions: If you have smoked or chewed Tobacco  in the last 2 yrs please stop smoking, stop any regular Alcohol  and  or any Recreational drug use.  Wear Seat belts while driving.   Please note  You were cared for by a hospitalist during your hospital stay. If you have any questions about your discharge medications or the care you received while you were in the hospital after you are discharged, you can call the unit and asked to speak with the hospitalist on call if the hospitalist that took care of you is not available. Once you are discharged, your primary care physician will handle any further medical issues. Please note that NO REFILLS for any discharge medications will be authorized once you are discharged, as it is imperative that you return to your primary care physician (or establish a relationship with a primary care physician if you do not have one) for your aftercare needs so that they can reassess your need for medications and monitor your lab values.

## 2017-06-05 NOTE — Progress Notes (Addendum)
PHARMACY CONSULT NOTE FOR:  OUTPATIENT  PARENTERAL ANTIBIOTIC THERAPY (OPAT)  Indication: UTI (hx recent ecoli, proteus and PsA UTI) Regimen: cefepime 2 gm IV q12h End date: 06/10/17  IV antibiotic discharge orders are pended. To discharging provider:  please sign these orders via discharge navigator,  Select New Orders & click on the button choice - Manage This Unsigned Work.     Thank you for allowing pharmacy to be a part of this patient's care.  Lynelle Doctor 06/05/2017, 9:52 AM

## 2017-06-05 NOTE — Progress Notes (Signed)
[facility] called for 6:00 PM pickup.

## 2017-06-05 NOTE — Progress Notes (Signed)
[place] will provide Home Infusion Pharmacy services/IV ABX for [place] starting with his 11 PM dose tonight at home.  I have connected with Sharmon Revere, RN with [facility] and they will resume [facility] services for [facility] tomorrow.  If patient discharges after hours, please call [phone].   Larry Sierras 06/05/2017, 12:46 PM

## 2017-06-05 NOTE — Progress Notes (Signed)
Cannot go home till caregiver home at 1800.All d/c instructions given w/ verbal understanding.Will go home w/ PAC accessed for long term ABT therapy.

## 2017-06-05 NOTE — Care Management Note (Signed)
Case Management Note  Patient Details  Name: [name] [name] MRN: [id number] Date of Birth: [birth year]  Subjective/Objective:                    Action/Plan: Plan to discharge home with his sister and [place],   Expected Discharge Date:  06/05/17               Expected Discharge Plan:  Home/Self Care  In-House Referral:  Clinical Social Work  Discharge planning Services  CM Consult  Post Acute Care Choice:    Choice offered to:  Patient  DME Arranged:    DME Agency:     HH Arranged:  RN [place] Agency:  [place], [place] [facility] for IV ABX)  Status of Service:  In process, will continue to follow  If discussed at Long Length of Stay Meetings, dates discussed:    Additional CommentsPurcell Mouton, RN 06/05/2017, 11:32 AM
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
## 2017-06-06 LAB — CULTURE, BLOOD (ROUTINE X 2)
CULTURE: NO GROWTH
SPECIAL REQUESTS: ADEQUATE

## 2017-06-07 LAB — CULTURE, BLOOD (ROUTINE X 2)
Culture: NO GROWTH
SPECIAL REQUESTS: ADEQUATE

## 2017-06-14 DIAGNOSIS — N390 Urinary tract infection, site not specified: Secondary | ICD-10-CM | POA: Diagnosis not present

## 2017-06-14 DIAGNOSIS — Z452 Encounter for adjustment and management of vascular access device: Secondary | ICD-10-CM | POA: Diagnosis not present

## 2017-06-16 DIAGNOSIS — L89154 Pressure ulcer of sacral region, stage 4: Secondary | ICD-10-CM | POA: Diagnosis not present

## 2017-06-16 DIAGNOSIS — J9610 Chronic respiratory failure, unspecified whether with hypoxia or hypercapnia: Secondary | ICD-10-CM | POA: Diagnosis not present

## 2017-06-16 DIAGNOSIS — N390 Urinary tract infection, site not specified: Secondary | ICD-10-CM | POA: Diagnosis not present

## 2017-06-16 DIAGNOSIS — E662 Morbid (severe) obesity with alveolar hypoventilation: Secondary | ICD-10-CM | POA: Diagnosis not present

## 2017-06-16 DIAGNOSIS — I10 Essential (primary) hypertension: Secondary | ICD-10-CM | POA: Diagnosis not present

## 2017-06-16 DIAGNOSIS — L89113 Pressure ulcer of right upper back, stage 3: Secondary | ICD-10-CM | POA: Diagnosis not present

## 2017-06-16 DIAGNOSIS — Z466 Encounter for fitting and adjustment of urinary device: Secondary | ICD-10-CM | POA: Diagnosis not present

## 2017-06-16 DIAGNOSIS — M4628 Osteomyelitis of vertebra, sacral and sacrococcygeal region: Secondary | ICD-10-CM | POA: Diagnosis not present

## 2017-06-16 DIAGNOSIS — G8250 Quadriplegia, unspecified: Secondary | ICD-10-CM | POA: Diagnosis not present

## 2017-06-16 DIAGNOSIS — L89224 Pressure ulcer of left hip, stage 4: Secondary | ICD-10-CM | POA: Diagnosis not present

## 2017-06-16 DIAGNOSIS — F329 Major depressive disorder, single episode, unspecified: Secondary | ICD-10-CM | POA: Diagnosis not present

## 2017-06-16 DIAGNOSIS — Z452 Encounter for adjustment and management of vascular access device: Secondary | ICD-10-CM | POA: Diagnosis not present

## 2017-06-16 DIAGNOSIS — G4733 Obstructive sleep apnea (adult) (pediatric): Secondary | ICD-10-CM | POA: Diagnosis not present

## 2017-06-16 DIAGNOSIS — L89314 Pressure ulcer of right buttock, stage 4: Secondary | ICD-10-CM | POA: Diagnosis not present

## 2017-06-16 DIAGNOSIS — Z933 Colostomy status: Secondary | ICD-10-CM | POA: Diagnosis not present

## 2017-06-16 DIAGNOSIS — Z5181 Encounter for therapeutic drug level monitoring: Secondary | ICD-10-CM | POA: Diagnosis not present

## 2017-06-19 ENCOUNTER — Ambulatory Visit (INDEPENDENT_AMBULATORY_CARE_PROVIDER_SITE_OTHER): Payer: Medicare Other | Admitting: Neurology

## 2017-06-19 DIAGNOSIS — L89119 Pressure ulcer of right upper back, unspecified stage: Secondary | ICD-10-CM | POA: Diagnosis not present

## 2017-06-19 DIAGNOSIS — G8250 Quadriplegia, unspecified: Secondary | ICD-10-CM | POA: Diagnosis not present

## 2017-06-19 DIAGNOSIS — G4733 Obstructive sleep apnea (adult) (pediatric): Secondary | ICD-10-CM | POA: Diagnosis not present

## 2017-06-19 DIAGNOSIS — A419 Sepsis, unspecified organism: Secondary | ICD-10-CM | POA: Diagnosis not present

## 2017-06-19 DIAGNOSIS — L89159 Pressure ulcer of sacral region, unspecified stage: Secondary | ICD-10-CM | POA: Diagnosis not present

## 2017-06-19 DIAGNOSIS — G4730 Sleep apnea, unspecified: Secondary | ICD-10-CM

## 2017-06-19 DIAGNOSIS — E66813 Obesity, class 3: Secondary | ICD-10-CM

## 2017-06-19 DIAGNOSIS — G8253 Quadriplegia, C5-C7 complete: Secondary | ICD-10-CM

## 2017-06-19 DIAGNOSIS — T50905A Adverse effect of unspecified drugs, medicaments and biological substances, initial encounter: Secondary | ICD-10-CM

## 2017-06-20 ENCOUNTER — Encounter (HOSPITAL_BASED_OUTPATIENT_CLINIC_OR_DEPARTMENT_OTHER): Payer: Medicare Other | Attending: Internal Medicine

## 2017-06-20 DIAGNOSIS — L89154 Pressure ulcer of sacral region, stage 4: Secondary | ICD-10-CM | POA: Diagnosis not present

## 2017-06-20 DIAGNOSIS — L89612 Pressure ulcer of right heel, stage 2: Secondary | ICD-10-CM | POA: Insufficient documentation

## 2017-06-20 DIAGNOSIS — G4730 Sleep apnea, unspecified: Secondary | ICD-10-CM | POA: Insufficient documentation

## 2017-06-20 DIAGNOSIS — L97512 Non-pressure chronic ulcer of other part of right foot with fat layer exposed: Secondary | ICD-10-CM | POA: Diagnosis not present

## 2017-06-20 DIAGNOSIS — I10 Essential (primary) hypertension: Secondary | ICD-10-CM | POA: Diagnosis not present

## 2017-06-20 DIAGNOSIS — G8250 Quadriplegia, unspecified: Secondary | ICD-10-CM | POA: Insufficient documentation

## 2017-06-20 DIAGNOSIS — L89894 Pressure ulcer of other site, stage 4: Secondary | ICD-10-CM | POA: Diagnosis not present

## 2017-06-20 DIAGNOSIS — L98492 Non-pressure chronic ulcer of skin of other sites with fat layer exposed: Secondary | ICD-10-CM | POA: Diagnosis not present

## 2017-06-22 DIAGNOSIS — R197 Diarrhea, unspecified: Secondary | ICD-10-CM | POA: Diagnosis not present

## 2017-06-24 ENCOUNTER — Encounter (HOSPITAL_COMMUNITY): Payer: Self-pay | Admitting: Emergency Medicine

## 2017-06-24 ENCOUNTER — Emergency Department (HOSPITAL_COMMUNITY)
Admission: EM | Admit: 2017-06-24 | Discharge: 2017-06-24 | Disposition: A | Discharge status: Home / Self Care | Payer: Medicare Other | Attending: Emergency Medicine | Admitting: Emergency Medicine

## 2017-06-24 DIAGNOSIS — Z79899 Other long term (current) drug therapy: Secondary | ICD-10-CM | POA: Insufficient documentation

## 2017-06-24 DIAGNOSIS — A0471 Enterocolitis due to Clostridium difficile, recurrent: Secondary | ICD-10-CM | POA: Diagnosis not present

## 2017-06-24 DIAGNOSIS — K591 Functional diarrhea: Secondary | ICD-10-CM | POA: Diagnosis not present

## 2017-06-24 DIAGNOSIS — G8250 Quadriplegia, unspecified: Secondary | ICD-10-CM | POA: Diagnosis not present

## 2017-06-24 DIAGNOSIS — B999 Unspecified infectious disease: Secondary | ICD-10-CM | POA: Diagnosis not present

## 2017-06-24 DIAGNOSIS — I10 Essential (primary) hypertension: Secondary | ICD-10-CM | POA: Insufficient documentation

## 2017-06-24 DIAGNOSIS — R197 Diarrhea, unspecified: Secondary | ICD-10-CM | POA: Diagnosis not present

## 2017-06-24 DIAGNOSIS — A0472 Enterocolitis due to Clostridium difficile, not specified as recurrent: Secondary | ICD-10-CM

## 2017-06-24 DIAGNOSIS — R110 Nausea: Secondary | ICD-10-CM | POA: Diagnosis not present

## 2017-06-24 LAB — CBC WITH DIFFERENTIAL/PLATELET
BASOS PCT: 0 %
Basophils Absolute: 0 10*3/uL (ref 0.0–0.1)
EOS ABS: 0.5 10*3/uL (ref 0.0–0.7)
EOS PCT: 8 %
HCT: 32.1 % — ABNORMAL LOW (ref 39.0–52.0)
Hemoglobin: 9.6 g/dL — ABNORMAL LOW (ref 13.0–17.0)
Lymphocytes Relative: 30 %
Lymphs Abs: 1.9 10*3/uL (ref 0.7–4.0)
MCH: 24.4 pg — AB (ref 26.0–34.0)
MCHC: 29.9 g/dL — AB (ref 30.0–36.0)
MCV: 81.5 fL (ref 78.0–100.0)
MONO ABS: 0.6 10*3/uL (ref 0.1–1.0)
Monocytes Relative: 10 %
Neutro Abs: 3.3 10*3/uL (ref 1.7–7.7)
Neutrophils Relative %: 52 %
PLATELETS: 305 10*3/uL (ref 150–400)
RBC: 3.94 MIL/uL — ABNORMAL LOW (ref 4.22–5.81)
RDW: 15.8 % — AB (ref 11.5–15.5)
WBC: 6.5 10*3/uL (ref 4.0–10.5)

## 2017-06-24 LAB — URINALYSIS, ROUTINE W REFLEX MICROSCOPIC
Bilirubin Urine: NEGATIVE
Glucose, UA: NEGATIVE mg/dL
Hgb urine dipstick: NEGATIVE
KETONES UR: NEGATIVE mg/dL
NITRITE: NEGATIVE
Protein, ur: 30 mg/dL — AB
Specific Gravity, Urine: 1.016 (ref 1.005–1.030)
pH: 9 — ABNORMAL HIGH (ref 5.0–8.0)

## 2017-06-24 LAB — COMPREHENSIVE METABOLIC PANEL
ALBUMIN: 2.8 g/dL — AB (ref 3.5–5.0)
ALK PHOS: 94 U/L (ref 38–126)
ALT: 27 U/L (ref 17–63)
AST: 27 U/L (ref 15–41)
Anion gap: 8 (ref 5–15)
BUN: 12 mg/dL (ref 6–20)
CALCIUM: 9 mg/dL (ref 8.9–10.3)
CO2: 21 mmol/L — AB (ref 22–32)
CREATININE: 0.41 mg/dL — AB (ref 0.61–1.24)
Chloride: 112 mmol/L — ABNORMAL HIGH (ref 101–111)
GFR calc non Af Amer: >60 mL/min (ref >60)
GLUCOSE: 95 mg/dL (ref 65–99)
Potassium: 3.7 mmol/L (ref 3.5–5.1)
Sodium: 141 mmol/L (ref 135–145)
Total Bilirubin: 0.4 mg/dL (ref 0.3–1.2)
Total Protein: 8 g/dL (ref 6.5–8.1)

## 2017-06-24 LAB — I-STAT CG4 LACTIC ACID, ED: Lactic Acid, Venous: 0.75 mmol/L (ref 0.5–1.9)

## 2017-06-24 MED ORDER — METRONIDAZOLE 500 MG PO TABS: 500.0000 mg | ORAL_TABLET | Freq: Two times a day (BID) | ORAL | 0 refills | Status: DC

## 2017-06-24 MED ORDER — SODIUM CHLORIDE 0.9 % IV BOLUS (SEPSIS)
1000.0000 mL | Freq: Once | INTRAVENOUS | Status: AC
  Administered 2017-06-24: 1000 mL via INTRAVENOUS

## 2017-06-24 MED ORDER — METRONIDAZOLE 500 MG PO TABS
500.0000 mg | ORAL_TABLET | Freq: Once | ORAL | Status: AC
  Administered 2017-06-24: 500 mg via ORAL
  Filled 2017-06-24: qty 1

## 2017-06-24 MED ORDER — HEPARIN SOD (PORK) LOCK FLUSH 100 UNIT/ML IV SOLN
500.0000 [IU] | Freq: Once | INTRAVENOUS | Status: AC
  Administered 2017-06-24: 500 [IU]
  Filled 2017-06-24 (×2): qty 5

## 2017-06-24 NOTE — Discharge Instructions (Addendum)
Please read and follow all provided instructions.  Your diagnoses today include:  1. C. difficile diarrhea     Tests performed today include:  Blood counts and electrolytes  Lactic acid-no signs of sepsis  Vital signs. See below for your results today.   Medications prescribed:   Metronidazole - antibiotic  You have been prescribed an antibiotic medicine: take the entire course of medicine even if you are feeling better. Stopping early can cause the antibiotic not to work. Do not drink alcohol when taking this medication.   Take any prescribed medications only as directed.  Home care instructions:  Follow any educational materials contained in this packet.  BE VERY CAREFUL not to take multiple medicines containing Tylenol (also called acetaminophen). Doing so can lead to an overdose which can damage your liver and cause liver failure and possibly death.   Follow-up instructions: Please follow-up with your primary care provider in the next 5 days for further evaluation of your symptoms.   Return instructions:   Please return to the Emergency Department if you experience worsening symptoms.   Return with fever, vomiting, pain.   Please return if you have any other emergent concerns.  Additional Information:  Your vital signs today were: BP (!) 152/95    Pulse 78    Temp 98.1 F (36.7 C) (Oral)    Resp 16    SpO2 98%  If your blood pressure (BP) was elevated above 135/85 this visit, please have this repeated by your doctor within one month. --------------

## 2017-06-24 NOTE — ED Triage Notes (Signed)
Per EMS:  Patient had positive c-diff sample with home nurse after 2 weeks of n/v and diarrhea.  EMS also noted open wounds to legs and a hx of sepsis.
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
## 2017-06-28 ENCOUNTER — Telehealth: Payer: Self-pay | Admitting: Infectious Diseases

## 2017-06-28 NOTE — Telephone Encounter (Signed)
Called pt as he had been in hospital for UTI.  Was seen in ED on 2-9 for diarrhea.  He was tested + for C diff.  He was started on flagyl and has not had BM "a couple of days".  Has been feeling better.

## 2017-06-29 LAB — CULTURE, BLOOD (ROUTINE X 2)
CULTURE: NO GROWTH
Culture: NO GROWTH
SPECIAL REQUESTS: ADEQUATE
Special Requests: ADEQUATE

## 2017-06-29 IMAGING — CT CT ABD-PELV W/O CM
2 of 4 series · 16 of 46 positions shown, 18 images · non-contrast
Comparison: 10/08/2014

CLINICAL DATA: Abdominal pain with nausea and vomiting.
Quadriplegia. History of gastric ulcers. Symptoms developed acutely.

EXAM:
CT ABDOMEN AND PELVIS WITHOUT CONTRAST
TECHNIQUE: Multidetector CT imaging of the abdomen and pelvis was performed
following the standard protocol without IV contrast.

[Series 3: abd/ pelvis 5.0 i30f 1 · axial · 0.96mm/px · z∈[+946,+1396]mm · 13 of 98 slices shown, 15 images]
[im 4/98  soft-tissue]
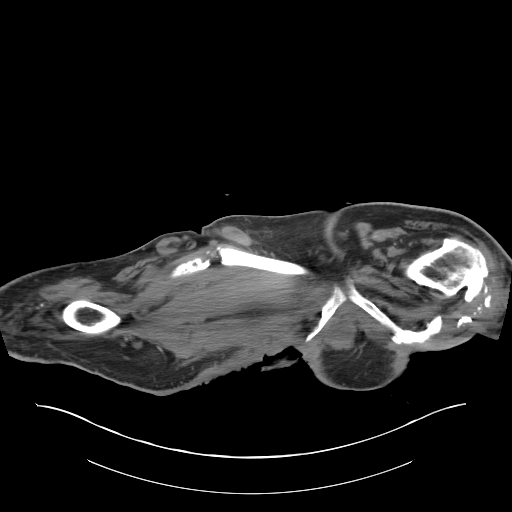
[im 4/98  bone]
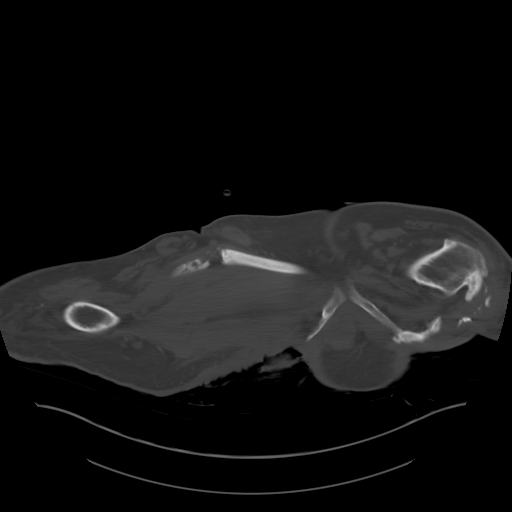
[im 12/98  soft-tissue]
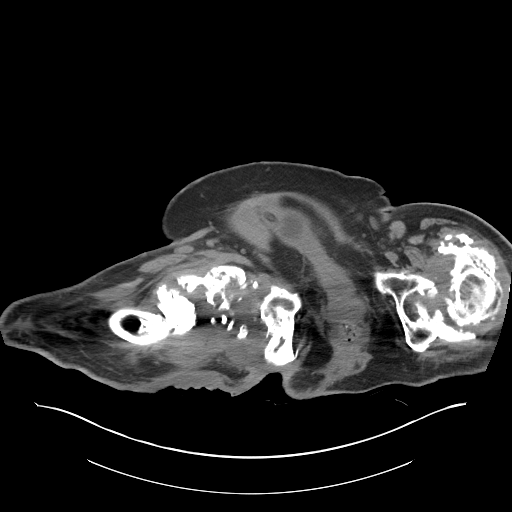
[im 19/98  soft-tissue]
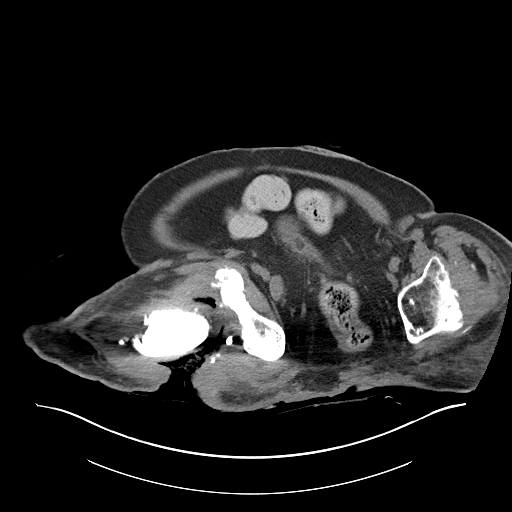
[im 27/98  soft-tissue]
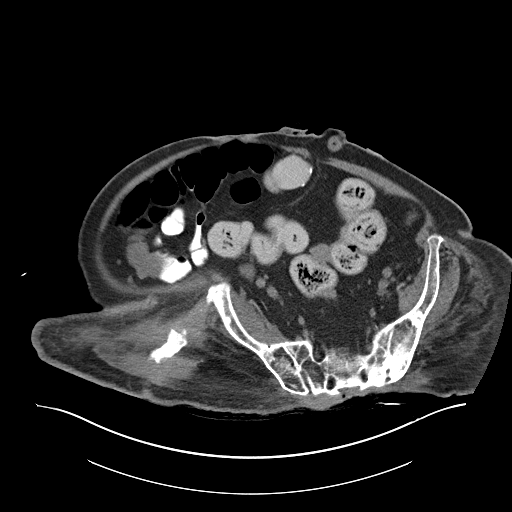
[im 34/98  soft-tissue]
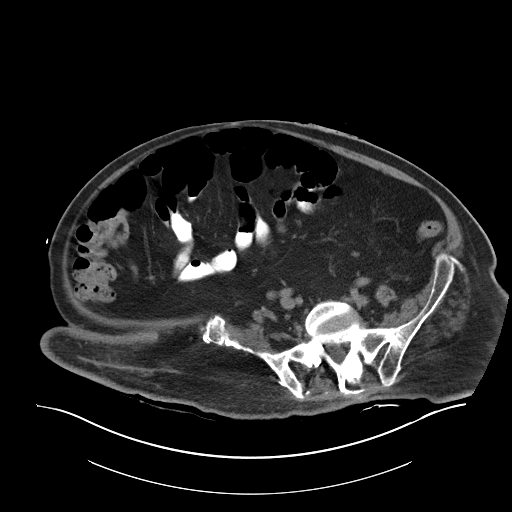
[im 42/98  soft-tissue]
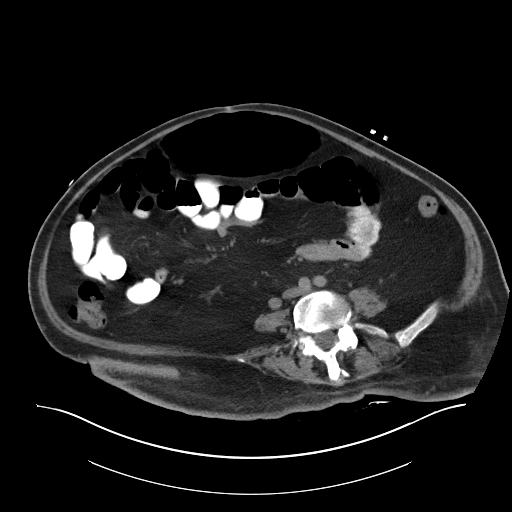
[im 49/98  soft-tissue]
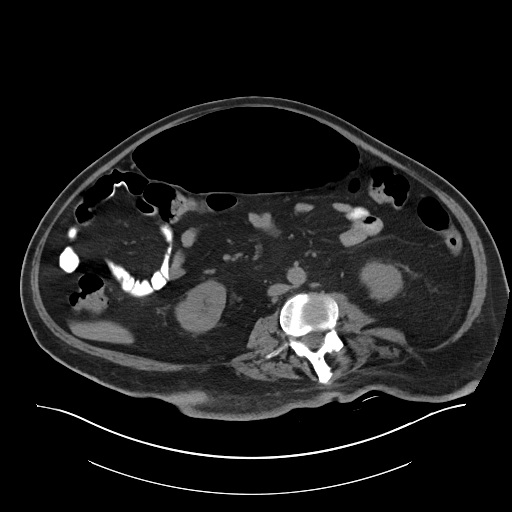
[im 56/98  soft-tissue]
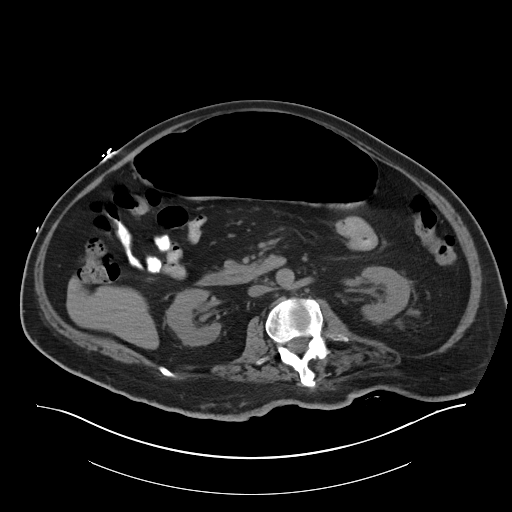
[im 64/98  soft-tissue]
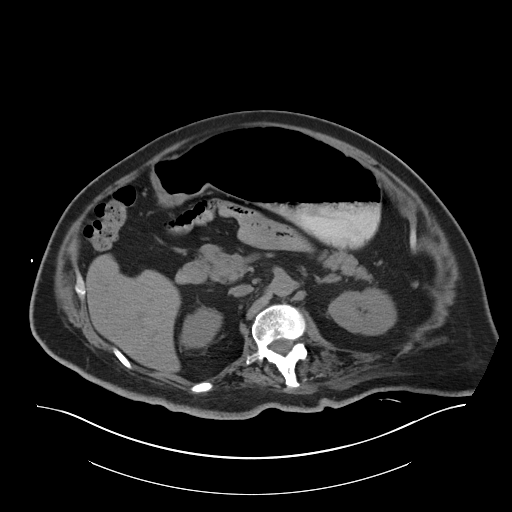
[im 64/98  bone]
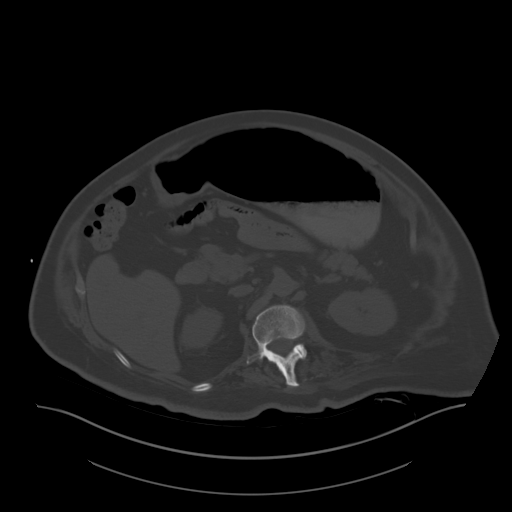
[im 71/98  soft-tissue]
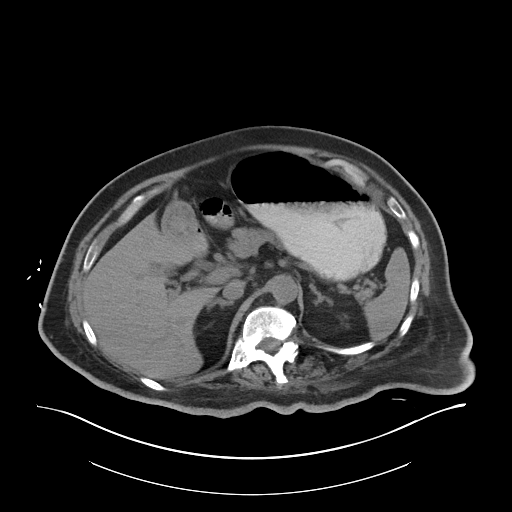
[im 79/98  soft-tissue]
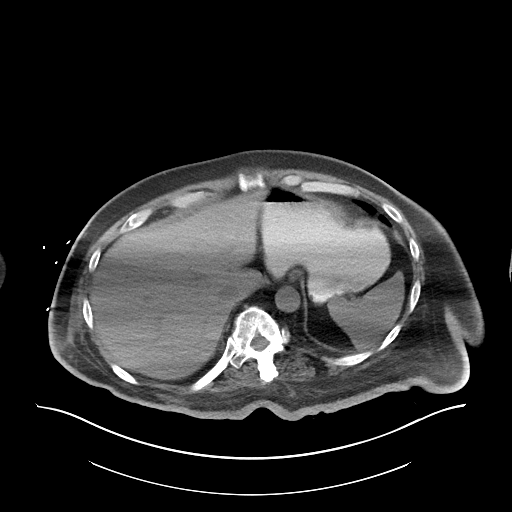
[im 86/98  soft-tissue]
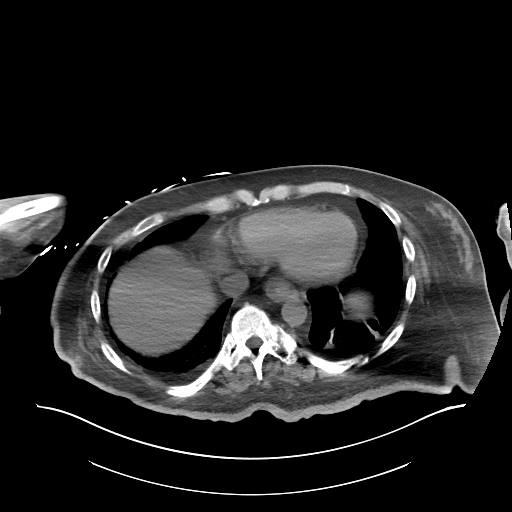
[im 94/98  soft-tissue]
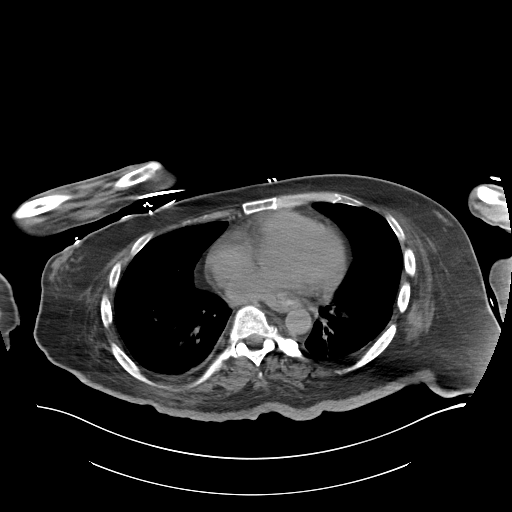

[Series 6: cor st · coronal · 0.92mm/px · 3 of 101 slices shown]
[im 34/101  soft-tissue]
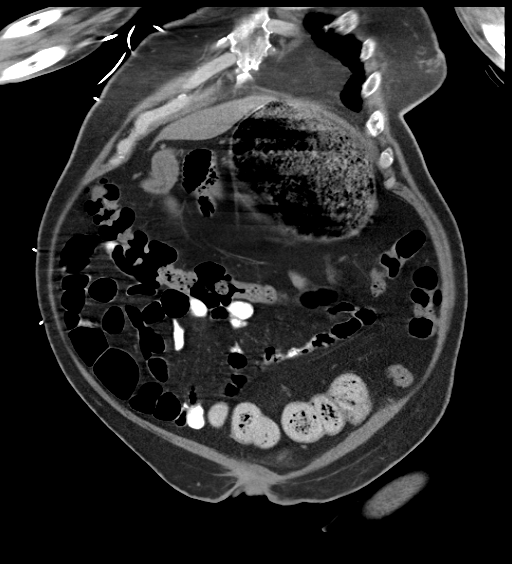
[im 45/101  soft-tissue]
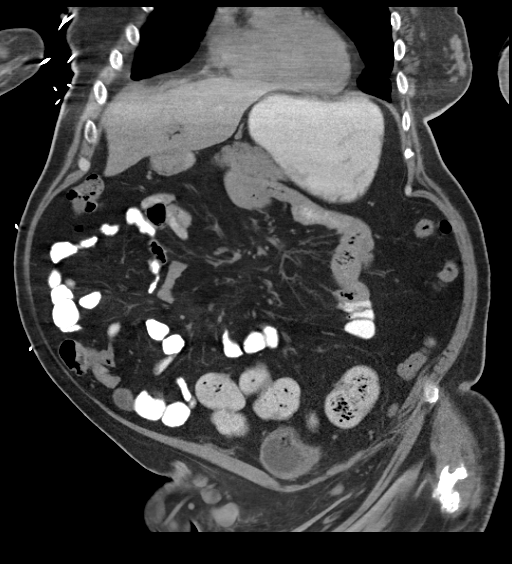
[im 56/101  soft-tissue]
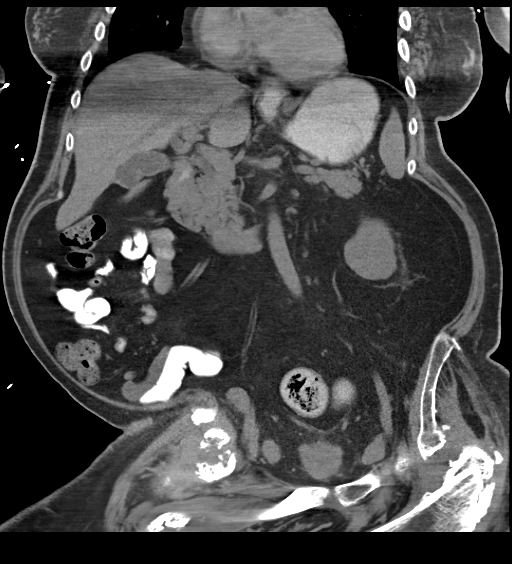

[16 of 46 positions shown; findings below may reference images not displayed]

FINDINGS: Small amount of pleural fluid on the right layering dependently.
Mild dependent pulmonary atelectasis.

The liver has a normal appearance without contrast. No calcified
gallstones or gross evidence of gallbladder inflammation. The spleen
is normal. The pancreas is normal. The adrenal glands are normal.
The kidneys are normal. No cyst, mass, stone or hydronephrosis. The
aorta and IVC are normal. No retroperitoneal mass or
lymphadenopathy. No free intraperitoneal fluid or air. The stomach
is distended with fluid and air. One could question thickening in
the antral region that could go along with peptic disease. No
evidence of perforated ulcer. The remainder of the intestine appears
normal without dilatation. There is a left lower quadrant colostomy.
Mucous fistula contains fecal matter. Cystostomy tube in the bladder
appears unremarkable. Extensive decubitus ulcers with communication
with the right hip joint and chronic arthro pathic changes of the
hips again noted.
IMPRESSION: The stomach is distended with fluid and air. One could question
thickening in the gastric antrum that could go along with peptic
inflammation. No sign of penetrating or perforated ulcer. This is
not a high reliability finding by CT. The remainder the intestine
appears unremarkable. No complication seen relative to the colostomy
or mucous fistula.

## 2017-07-02 IMAGING — US IR FLUORO GUIDE CV LINE*L*
1 series · 1 of 1 positions shown · non-contrast
Comparison: none

CLINICAL DATA: 49-year-old with osteomyelitis of the pelvic region.
PICC line needed for antibiotics.

EXAM:
PLACEMENT OF A PERIPHERALLY INSERTED CENTRAL VENOUS CATHETER WITH
ULTRASOUND AND FLUOROSCOPIC GUIDANCE
FLUOROSCOPY TIME:  36 seconds, 7 mGy
TECHNIQUE: The procedure was explained to the patient. The risks and benefits
of the procedure were discussed and the patient's questions were
addressed. Informed consent was obtained from the patient. The left
arm was prepped with chlorhexidine, draped in the usual sterile
fashion using maximum barrier technique (cap and mask, sterile gown,
sterile gloves, large sterile sheet, hand hygiene and cutaneous
antiseptic). Local anesthesia was attained by infiltration with 1%
lidocaine.

[Series 1: ir fluoro/shunt/fist · 1 of 1 slices shown]
[im 1/1]
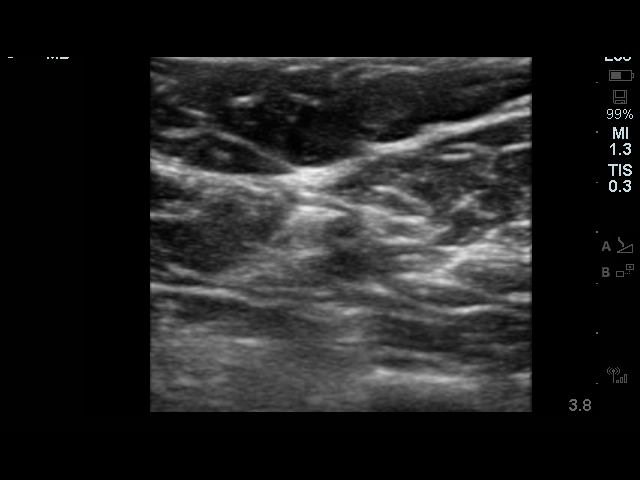

[1 of 1 positions shown; findings below may reference images not displayed]

Ultrasound demonstrated patency of the left brachial vein, and this
was documented with an image. Under real-time ultrasound guidance,
this vein was accessed with a 21 gauge micropuncture needle and
image documentation was performed. The needle was exchanged over a
guidewire for a peel-away sheath through which a 49 cm 5 French dual
lumen power injectable PICC was advanced, and positioned with its
tip at the lower SVC/right atrial junction. Fluoroscopy during the
procedure and fluoro spot radiograph confirms appropriate catheter
position. The catheter was flushed, secured to the skin with Prolene
sutures, and covered with a sterile dressing.

Estimated blood loss: Minimal

COMPLICATIONS:
None.  The patient tolerated the procedure well.
IMPRESSION: Successful placement of a left arm PICC with sonographic and
fluoroscopic guidance. The catheter is ready for use.

## 2017-07-04 DIAGNOSIS — L89159 Pressure ulcer of sacral region, unspecified stage: Secondary | ICD-10-CM | POA: Diagnosis not present

## 2017-07-04 DIAGNOSIS — G8250 Quadriplegia, unspecified: Secondary | ICD-10-CM | POA: Diagnosis not present

## 2017-07-04 DIAGNOSIS — L89154 Pressure ulcer of sacral region, stage 4: Secondary | ICD-10-CM | POA: Diagnosis not present

## 2017-07-04 DIAGNOSIS — L89119 Pressure ulcer of right upper back, unspecified stage: Secondary | ICD-10-CM | POA: Diagnosis not present

## 2017-07-05 ENCOUNTER — Other Ambulatory Visit: Payer: Self-pay | Admitting: Neurology

## 2017-07-05 DIAGNOSIS — G4733 Obstructive sleep apnea (adult) (pediatric): Secondary | ICD-10-CM

## 2017-07-05 DIAGNOSIS — G8253 Quadriplegia, C5-C7 complete: Secondary | ICD-10-CM

## 2017-07-05 DIAGNOSIS — L89224 Pressure ulcer of left hip, stage 4: Secondary | ICD-10-CM | POA: Diagnosis not present

## 2017-07-05 DIAGNOSIS — N390 Urinary tract infection, site not specified: Secondary | ICD-10-CM | POA: Diagnosis not present

## 2017-07-05 DIAGNOSIS — L89314 Pressure ulcer of right buttock, stage 4: Secondary | ICD-10-CM | POA: Diagnosis not present

## 2017-07-05 DIAGNOSIS — Z466 Encounter for fitting and adjustment of urinary device: Secondary | ICD-10-CM | POA: Diagnosis not present

## 2017-07-05 DIAGNOSIS — F329 Major depressive disorder, single episode, unspecified: Secondary | ICD-10-CM | POA: Diagnosis not present

## 2017-07-05 DIAGNOSIS — J9610 Chronic respiratory failure, unspecified whether with hypoxia or hypercapnia: Secondary | ICD-10-CM | POA: Diagnosis not present

## 2017-07-05 DIAGNOSIS — L89154 Pressure ulcer of sacral region, stage 4: Secondary | ICD-10-CM

## 2017-07-05 DIAGNOSIS — Z5181 Encounter for therapeutic drug level monitoring: Secondary | ICD-10-CM | POA: Diagnosis not present

## 2017-07-05 DIAGNOSIS — Z933 Colostomy status: Secondary | ICD-10-CM | POA: Diagnosis not present

## 2017-07-05 DIAGNOSIS — G8250 Quadriplegia, unspecified: Secondary | ICD-10-CM

## 2017-07-05 DIAGNOSIS — G4734 Idiopathic sleep related nonobstructive alveolar hypoventilation: Principal | ICD-10-CM

## 2017-07-05 DIAGNOSIS — Z452 Encounter for adjustment and management of vascular access device: Secondary | ICD-10-CM | POA: Diagnosis not present

## 2017-07-05 DIAGNOSIS — L89113 Pressure ulcer of right upper back, stage 3: Secondary | ICD-10-CM | POA: Diagnosis not present

## 2017-07-05 DIAGNOSIS — E662 Morbid (severe) obesity with alveolar hypoventilation: Secondary | ICD-10-CM | POA: Diagnosis not present

## 2017-07-05 DIAGNOSIS — I10 Essential (primary) hypertension: Secondary | ICD-10-CM | POA: Diagnosis not present

## 2017-07-05 DIAGNOSIS — M4628 Osteomyelitis of vertebra, sacral and sacrococcygeal region: Secondary | ICD-10-CM | POA: Diagnosis not present
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
## 2017-07-06 ENCOUNTER — Telehealth: Payer: Self-pay | Admitting: Neurology

## 2017-07-06 NOTE — Telephone Encounter (Signed)
Called patient to discuss sleep study results. No answer at this time. LVM for the patient to call back.   

## 2017-07-06 NOTE — Telephone Encounter (Signed)
-----   Message from Larey Seat, MD sent at 07/05/2017  5:32 PM EST ----- IMPRESSION: Severe obstructive sleep apnea with significant  hypoxemia and tachy-bradycardia.  RECOMMENDATION: auto-titration CPAP 7-20 cm water, 3 cm EPR and  mask of choice. ONO on CPAP to check if hypoxemia is corrected by  CPAP alone.     cc Dr Baird Cancer

## 2017-07-06 NOTE — Telephone Encounter (Signed)
Pt returned call. I advised pt that Dr. Brett Fairy reviewed their sleep study results and found that pt has severe sleep apnea. Dr. Brett Fairy recommends that pt starts auto PAP. I reviewed PAP compliance expectations with the pt. Pt is agreeable to starting an auto-PAP. I advised pt that an order will be sent to a DME, [facility], and [facility] will call the pt within about one week after they file with the pt's insurance. [facility] will show the pt how to use the machine, fit for masks, and troubleshoot the auto-PAP if needed. A follow up appt was made for insurance purposes with Cecille Rubin, NP on Oct 04, 2017 at 2:15pm . Pt verbalized understanding to arrive 15 minutes early and bring their auto-PAP. A letter with all of this information in it will be mailed to the pt as a reminder. I verified with the pt that the address we have on file is correct. Pt verbalized understanding of results. Pt had no questions at this time but was encouraged to call back if questions arise.

## 2017-07-10 DIAGNOSIS — S31109A Unspecified open wound of abdominal wall, unspecified quadrant without penetration into peritoneal cavity, initial encounter: Secondary | ICD-10-CM | POA: Diagnosis not present

## 2017-07-10 DIAGNOSIS — L89154 Pressure ulcer of sacral region, stage 4: Secondary | ICD-10-CM | POA: Diagnosis not present

## 2017-07-10 DIAGNOSIS — S61200A Unspecified open wound of right index finger without damage to nail, initial encounter: Secondary | ICD-10-CM | POA: Diagnosis not present

## 2017-07-10 DIAGNOSIS — S91301A Unspecified open wound, right foot, initial encounter: Secondary | ICD-10-CM | POA: Diagnosis not present

## 2017-07-10 DIAGNOSIS — S31000A Unspecified open wound of lower back and pelvis without penetration into retroperitoneum, initial encounter: Secondary | ICD-10-CM | POA: Diagnosis not present

## 2017-07-12 DIAGNOSIS — N319 Neuromuscular dysfunction of bladder, unspecified: Secondary | ICD-10-CM | POA: Diagnosis not present

## 2017-07-12 DIAGNOSIS — N3020 Other chronic cystitis without hematuria: Secondary | ICD-10-CM | POA: Diagnosis not present

## 2017-07-12 DIAGNOSIS — N39498 Other specified urinary incontinence: Secondary | ICD-10-CM | POA: Diagnosis not present

## 2017-07-18 ENCOUNTER — Emergency Department (HOSPITAL_COMMUNITY): Payer: Medicare Other

## 2017-07-18 ENCOUNTER — Inpatient Hospital Stay (HOSPITAL_COMMUNITY)
Admission: EM | Admit: 2017-07-18 | Discharge: 2017-07-22 | DRG: 698 | Disposition: A | Discharge status: Home Health | Payer: Medicare Other | Attending: Internal Medicine | Admitting: Internal Medicine

## 2017-07-18 ENCOUNTER — Encounter (HOSPITAL_COMMUNITY): Payer: Self-pay | Admitting: Family Medicine

## 2017-07-18 DIAGNOSIS — G8250 Quadriplegia, unspecified: Secondary | ICD-10-CM | POA: Diagnosis not present

## 2017-07-18 DIAGNOSIS — J189 Pneumonia, unspecified organism: Secondary | ICD-10-CM | POA: Diagnosis not present

## 2017-07-18 DIAGNOSIS — T83511A Infection and inflammatory reaction due to indwelling urethral catheter, initial encounter: Principal | ICD-10-CM | POA: Diagnosis present

## 2017-07-18 DIAGNOSIS — L89154 Pressure ulcer of sacral region, stage 4: Secondary | ICD-10-CM | POA: Diagnosis not present

## 2017-07-18 DIAGNOSIS — N390 Urinary tract infection, site not specified: Secondary | ICD-10-CM | POA: Diagnosis present

## 2017-07-18 DIAGNOSIS — R946 Abnormal results of thyroid function studies: Secondary | ICD-10-CM

## 2017-07-18 DIAGNOSIS — J984 Other disorders of lung: Secondary | ICD-10-CM | POA: Diagnosis not present

## 2017-07-18 DIAGNOSIS — R140 Abdominal distension (gaseous): Secondary | ICD-10-CM

## 2017-07-18 DIAGNOSIS — Z87440 Personal history of urinary (tract) infections: Secondary | ICD-10-CM

## 2017-07-18 DIAGNOSIS — E041 Nontoxic single thyroid nodule: Secondary | ICD-10-CM | POA: Diagnosis present

## 2017-07-18 DIAGNOSIS — R52 Pain, unspecified: Secondary | ICD-10-CM

## 2017-07-18 DIAGNOSIS — A419 Sepsis, unspecified organism: Secondary | ICD-10-CM | POA: Diagnosis not present

## 2017-07-18 DIAGNOSIS — R197 Diarrhea, unspecified: Secondary | ICD-10-CM | POA: Diagnosis not present

## 2017-07-18 DIAGNOSIS — J9811 Atelectasis: Secondary | ICD-10-CM | POA: Diagnosis present

## 2017-07-18 DIAGNOSIS — Z79899 Other long term (current) drug therapy: Secondary | ICD-10-CM

## 2017-07-18 DIAGNOSIS — R000 Tachycardia, unspecified: Secondary | ICD-10-CM | POA: Diagnosis not present

## 2017-07-18 DIAGNOSIS — D638 Anemia in other chronic diseases classified elsewhere: Secondary | ICD-10-CM | POA: Diagnosis present

## 2017-07-18 DIAGNOSIS — L89614 Pressure ulcer of right heel, stage 4: Secondary | ICD-10-CM | POA: Diagnosis not present

## 2017-07-18 DIAGNOSIS — L89159 Pressure ulcer of sacral region, unspecified stage: Secondary | ICD-10-CM | POA: Diagnosis present

## 2017-07-18 DIAGNOSIS — G4733 Obstructive sleep apnea (adult) (pediatric): Secondary | ICD-10-CM | POA: Diagnosis present

## 2017-07-18 DIAGNOSIS — R222 Localized swelling, mass and lump, trunk: Secondary | ICD-10-CM

## 2017-07-18 DIAGNOSIS — R509 Fever, unspecified: Secondary | ICD-10-CM

## 2017-07-18 DIAGNOSIS — Z9989 Dependence on other enabling machines and devices: Secondary | ICD-10-CM

## 2017-07-18 DIAGNOSIS — I10 Essential (primary) hypertension: Secondary | ICD-10-CM | POA: Diagnosis present

## 2017-07-18 DIAGNOSIS — Y846 Urinary catheterization as the cause of abnormal reaction of the patient, or of later complication, without mention of misadventure at the time of the procedure: Secondary | ICD-10-CM | POA: Diagnosis present

## 2017-07-18 DIAGNOSIS — M8668 Other chronic osteomyelitis, other site: Secondary | ICD-10-CM | POA: Diagnosis present

## 2017-07-18 DIAGNOSIS — Z933 Colostomy status: Secondary | ICD-10-CM

## 2017-07-18 DIAGNOSIS — K219 Gastro-esophageal reflux disease without esophagitis: Secondary | ICD-10-CM | POA: Diagnosis present

## 2017-07-18 LAB — COMPREHENSIVE METABOLIC PANEL
ALBUMIN: 3.3 g/dL — AB (ref 3.5–5.0)
ALT: 26 U/L (ref 17–63)
AST: 25 U/L (ref 15–41)
Alkaline Phosphatase: 82 U/L (ref 38–126)
Anion gap: 10 (ref 5–15)
BILIRUBIN TOTAL: 0.5 mg/dL (ref 0.3–1.2)
BUN: 23 mg/dL — AB (ref 6–20)
CHLORIDE: 108 mmol/L (ref 101–111)
CO2: 21 mmol/L — AB (ref 22–32)
Calcium: 9 mg/dL (ref 8.9–10.3)
Creatinine, Ser: 0.66 mg/dL (ref 0.61–1.24)
GFR calc Af Amer: >60 mL/min (ref >60)
GFR calc non Af Amer: >60 mL/min (ref >60)
GLUCOSE: 113 mg/dL — AB (ref 65–99)
POTASSIUM: 4.3 mmol/L (ref 3.5–5.1)
SODIUM: 139 mmol/L (ref 135–145)
Total Protein: 8.5 g/dL — ABNORMAL HIGH (ref 6.5–8.1)

## 2017-07-18 LAB — CBC WITH DIFFERENTIAL/PLATELET
Basophils Absolute: 0 10*3/uL (ref 0.0–0.1)
Basophils Relative: 0 %
EOS PCT: 0 %
Eosinophils Absolute: 0 10*3/uL (ref 0.0–0.7)
HCT: 29.4 % — ABNORMAL LOW (ref 39.0–52.0)
Hemoglobin: 9 g/dL — ABNORMAL LOW (ref 13.0–17.0)
LYMPHS ABS: 0.6 10*3/uL — AB (ref 0.7–4.0)
LYMPHS PCT: 5 %
MCH: 24.5 pg — ABNORMAL LOW (ref 26.0–34.0)
MCHC: 30.6 g/dL (ref 30.0–36.0)
MCV: 79.9 fL (ref 78.0–100.0)
MONO ABS: 0.6 10*3/uL (ref 0.1–1.0)
MONOS PCT: 4 %
Neutro Abs: 12.3 10*3/uL — ABNORMAL HIGH (ref 1.7–7.7)
Neutrophils Relative %: 91 %
PLATELETS: 352 10*3/uL (ref 150–400)
RBC: 3.68 MIL/uL — ABNORMAL LOW (ref 4.22–5.81)
RDW: 15.4 % (ref 11.5–15.5)
WBC: 13.5 10*3/uL — ABNORMAL HIGH (ref 4.0–10.5)

## 2017-07-18 LAB — I-STAT CG4 LACTIC ACID, ED: Lactic Acid, Venous: 0.74 mmol/L (ref 0.5–1.9)

## 2017-07-18 LAB — INFLUENZA PANEL BY PCR (TYPE A & B)
Influenza A By PCR: NEGATIVE
Influenza B By PCR: NEGATIVE

## 2017-07-18 LAB — PROTIME-INR
INR: 1.14
Prothrombin Time: 14.6 seconds (ref 11.4–15.2)

## 2017-07-18 MED ORDER — SODIUM CHLORIDE 0.9% FLUSH: 10.0000 mL | INTRAVENOUS | Status: DC | PRN

## 2017-07-18 MED ORDER — SODIUM CHLORIDE 0.9 % IV BOLUS (SEPSIS)
500.0000 mL | Freq: Once | INTRAVENOUS | Status: AC
  Administered 2017-07-18: 500 mL via INTRAVENOUS

## 2017-07-18 MED ORDER — SODIUM CHLORIDE 0.9 % IV SOLN
2.0000 g | Freq: Once | INTRAVENOUS | Status: AC
  Administered 2017-07-18: 2 g via INTRAVENOUS
  Filled 2017-07-18: qty 2

## 2017-07-18 NOTE — ED Notes (Signed)
Bed: GI71 Expected date:  Expected time:  Means of arrival:  Comments: 52 yr old flu like sx

## 2017-07-18 NOTE — ED Notes (Signed)
IV team at bedside 
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
## 2017-07-19 ENCOUNTER — Encounter (HOSPITAL_COMMUNITY): Payer: Self-pay | Admitting: Internal Medicine

## 2017-07-19 ENCOUNTER — Inpatient Hospital Stay (HOSPITAL_COMMUNITY): Payer: Medicare Other

## 2017-07-19 ENCOUNTER — Other Ambulatory Visit: Payer: Self-pay

## 2017-07-19 DIAGNOSIS — N390 Urinary tract infection, site not specified: Secondary | ICD-10-CM | POA: Diagnosis not present

## 2017-07-19 DIAGNOSIS — L89614 Pressure ulcer of right heel, stage 4: Secondary | ICD-10-CM | POA: Diagnosis present

## 2017-07-19 DIAGNOSIS — D638 Anemia in other chronic diseases classified elsewhere: Secondary | ICD-10-CM | POA: Diagnosis present

## 2017-07-19 DIAGNOSIS — M9988 Other biomechanical lesions of rib cage: Secondary | ICD-10-CM

## 2017-07-19 DIAGNOSIS — Z8619 Personal history of other infectious and parasitic diseases: Secondary | ICD-10-CM

## 2017-07-19 DIAGNOSIS — R109 Unspecified abdominal pain: Secondary | ICD-10-CM

## 2017-07-19 DIAGNOSIS — Z87311 Personal history of (healed) other pathological fracture: Secondary | ICD-10-CM

## 2017-07-19 DIAGNOSIS — J189 Pneumonia, unspecified organism: Secondary | ICD-10-CM | POA: Diagnosis present

## 2017-07-19 DIAGNOSIS — L89104 Pressure ulcer of unspecified part of back, stage 4: Secondary | ICD-10-CM

## 2017-07-19 DIAGNOSIS — A419 Sepsis, unspecified organism: Secondary | ICD-10-CM | POA: Diagnosis present

## 2017-07-19 DIAGNOSIS — R140 Abdominal distension (gaseous): Secondary | ICD-10-CM

## 2017-07-19 DIAGNOSIS — R918 Other nonspecific abnormal finding of lung field: Secondary | ICD-10-CM | POA: Diagnosis not present

## 2017-07-19 DIAGNOSIS — L89899 Pressure ulcer of other site, unspecified stage: Secondary | ICD-10-CM

## 2017-07-19 DIAGNOSIS — I10 Essential (primary) hypertension: Secondary | ICD-10-CM | POA: Diagnosis not present

## 2017-07-19 DIAGNOSIS — M899 Disorder of bone, unspecified: Secondary | ICD-10-CM

## 2017-07-19 DIAGNOSIS — J9811 Atelectasis: Secondary | ICD-10-CM | POA: Diagnosis not present

## 2017-07-19 DIAGNOSIS — Z87440 Personal history of urinary (tract) infections: Secondary | ICD-10-CM | POA: Diagnosis not present

## 2017-07-19 DIAGNOSIS — R509 Fever, unspecified: Secondary | ICD-10-CM

## 2017-07-19 DIAGNOSIS — D649 Anemia, unspecified: Secondary | ICD-10-CM

## 2017-07-19 DIAGNOSIS — R197 Diarrhea, unspecified: Secondary | ICD-10-CM

## 2017-07-19 DIAGNOSIS — Y846 Urinary catheterization as the cause of abnormal reaction of the patient, or of later complication, without mention of misadventure at the time of the procedure: Secondary | ICD-10-CM | POA: Diagnosis present

## 2017-07-19 DIAGNOSIS — K219 Gastro-esophageal reflux disease without esophagitis: Secondary | ICD-10-CM | POA: Diagnosis not present

## 2017-07-19 DIAGNOSIS — Z79899 Other long term (current) drug therapy: Secondary | ICD-10-CM | POA: Diagnosis not present

## 2017-07-19 DIAGNOSIS — L89159 Pressure ulcer of sacral region, unspecified stage: Secondary | ICD-10-CM

## 2017-07-19 DIAGNOSIS — Z9989 Dependence on other enabling machines and devices: Secondary | ICD-10-CM

## 2017-07-19 DIAGNOSIS — R630 Anorexia: Secondary | ICD-10-CM

## 2017-07-19 DIAGNOSIS — G4733 Obstructive sleep apnea (adult) (pediatric): Secondary | ICD-10-CM

## 2017-07-19 DIAGNOSIS — M8668 Other chronic osteomyelitis, other site: Secondary | ICD-10-CM | POA: Diagnosis present

## 2017-07-19 DIAGNOSIS — T83511A Infection and inflammatory reaction due to indwelling urethral catheter, initial encounter: Secondary | ICD-10-CM | POA: Diagnosis present

## 2017-07-19 DIAGNOSIS — E041 Nontoxic single thyroid nodule: Secondary | ICD-10-CM | POA: Diagnosis not present

## 2017-07-19 DIAGNOSIS — Z933 Colostomy status: Secondary | ICD-10-CM | POA: Diagnosis not present

## 2017-07-19 DIAGNOSIS — G8220 Paraplegia, unspecified: Secondary | ICD-10-CM

## 2017-07-19 DIAGNOSIS — G8250 Quadriplegia, unspecified: Secondary | ICD-10-CM | POA: Diagnosis not present

## 2017-07-19 DIAGNOSIS — R110 Nausea: Secondary | ICD-10-CM

## 2017-07-19 DIAGNOSIS — L89154 Pressure ulcer of sacral region, stage 4: Secondary | ICD-10-CM | POA: Diagnosis present

## 2017-07-19 LAB — C DIFFICILE QUICK SCREEN W PCR REFLEX
C DIFFICILE (CDIFF) INTERP: NOT DETECTED
C DIFFICILE (CDIFF) TOXIN: NEGATIVE
C Diff antigen: NEGATIVE

## 2017-07-19 LAB — URINALYSIS, ROUTINE W REFLEX MICROSCOPIC
BILIRUBIN URINE: NEGATIVE
GLUCOSE, UA: NEGATIVE mg/dL
Hgb urine dipstick: NEGATIVE
Ketones, ur: NEGATIVE mg/dL
NITRITE: NEGATIVE
PH: 9 — AB (ref 5.0–8.0)
Protein, ur: 100 mg/dL — AB
SPECIFIC GRAVITY, URINE: 1.016 (ref 1.005–1.030)

## 2017-07-19 LAB — BASIC METABOLIC PANEL
ANION GAP: 7 (ref 5–15)
BUN: 22 mg/dL — ABNORMAL HIGH (ref 6–20)
CALCIUM: 8.7 mg/dL — AB (ref 8.9–10.3)
CO2: 22 mmol/L (ref 22–32)
Chloride: 109 mmol/L (ref 101–111)
Creatinine, Ser: 0.61 mg/dL (ref 0.61–1.24)
Glucose, Bld: 112 mg/dL — ABNORMAL HIGH (ref 65–99)
Potassium: 3.8 mmol/L (ref 3.5–5.1)
Sodium: 138 mmol/L (ref 135–145)

## 2017-07-19 LAB — CBC
HCT: 28.8 % — ABNORMAL LOW (ref 39.0–52.0)
Hemoglobin: 8.5 g/dL — ABNORMAL LOW (ref 13.0–17.0)
MCH: 23.9 pg — ABNORMAL LOW (ref 26.0–34.0)
MCHC: 29.5 g/dL — ABNORMAL LOW (ref 30.0–36.0)
MCV: 81.1 fL (ref 78.0–100.0)
Platelets: 328 10*3/uL (ref 150–400)
RBC: 3.55 MIL/uL — AB (ref 4.22–5.81)
RDW: 15.9 % — ABNORMAL HIGH (ref 11.5–15.5)
WBC: 12.8 10*3/uL — AB (ref 4.0–10.5)

## 2017-07-19 LAB — MRSA PCR SCREENING: MRSA by PCR: POSITIVE — AB

## 2017-07-19 MED ORDER — SODIUM CHLORIDE 0.9 % IV SOLN
1.0000 g | Freq: Two times a day (BID) | INTRAVENOUS | Status: DC
  Administered 2017-07-19 – 2017-07-20 (×3): 1 g via INTRAVENOUS
  Filled 2017-07-19 (×5): qty 1

## 2017-07-19 MED ORDER — ADULT MULTIVITAMIN W/MINERALS CH
1.0000 | ORAL_TABLET | Freq: Every morning | ORAL | Status: DC
  Administered 2017-07-19 – 2017-07-22 (×4): 1 via ORAL
  Filled 2017-07-19 (×5): qty 1

## 2017-07-19 MED ORDER — ONDANSETRON HCL 4 MG PO TABS: 4.0000 mg | ORAL_TABLET | Freq: Four times a day (QID) | ORAL | Status: DC | PRN

## 2017-07-19 MED ORDER — DAKINS (1/4 STRENGTH) 0.125 % EX SOLN
Freq: Every day | CUTANEOUS | Status: DC
  Administered 2017-07-19 – 2017-07-22 (×4)
  Filled 2017-07-19: qty 473

## 2017-07-19 MED ORDER — METOCLOPRAMIDE HCL 10 MG PO TABS
10.0000 mg | ORAL_TABLET | Freq: Four times a day (QID) | ORAL | Status: DC
  Administered 2017-07-19 – 2017-07-22 (×14): 10 mg via ORAL
  Filled 2017-07-19 (×14): qty 1

## 2017-07-19 MED ORDER — BACLOFEN 20 MG PO TABS
20.0000 mg | ORAL_TABLET | Freq: Four times a day (QID) | ORAL | Status: DC
  Administered 2017-07-19 – 2017-07-22 (×14): 20 mg via ORAL
  Filled 2017-07-19 (×4): qty 1
  Filled 2017-07-19: qty 2
  Filled 2017-07-19 (×9): qty 1

## 2017-07-19 MED ORDER — ACETAMINOPHEN 650 MG RE SUPP: 650.0000 mg | Freq: Four times a day (QID) | RECTAL | Status: DC | PRN

## 2017-07-19 MED ORDER — MUPIROCIN 2 % EX OINT
TOPICAL_OINTMENT | Freq: Every day | CUTANEOUS | Status: DC
  Administered 2017-07-19 – 2017-07-22 (×4): via TOPICAL
  Filled 2017-07-19 (×2): qty 22

## 2017-07-19 MED ORDER — FERROUS SULFATE 325 (65 FE) MG PO TABS
325.0000 mg | ORAL_TABLET | Freq: Three times a day (TID) | ORAL | Status: DC
  Administered 2017-07-19 – 2017-07-22 (×11): 325 mg via ORAL
  Filled 2017-07-19 (×12): qty 1

## 2017-07-19 MED ORDER — FESOTERODINE FUMARATE ER 8 MG PO TB24
8.0000 mg | ORAL_TABLET | Freq: Every day | ORAL | Status: DC
  Administered 2017-07-19 – 2017-07-22 (×4): 8 mg via ORAL
  Filled 2017-07-19 (×4): qty 1

## 2017-07-19 MED ORDER — ACETAMINOPHEN 325 MG PO TABS
650.0000 mg | ORAL_TABLET | Freq: Once | ORAL | Status: AC
  Administered 2017-07-19: 650 mg via ORAL
  Filled 2017-07-19: qty 2

## 2017-07-19 MED ORDER — SODIUM CHLORIDE 0.9 % IV SOLN
INTRAVENOUS | Status: AC
  Administered 2017-07-19 (×2): via INTRAVENOUS

## 2017-07-19 MED ORDER — SODIUM CHLORIDE 0.9 % IV SOLN
500.0000 mg | INTRAVENOUS | Status: DC
  Administered 2017-07-19 – 2017-07-20 (×2): 500 mg via INTRAVENOUS
  Filled 2017-07-19 (×4): qty 10

## 2017-07-19 MED ORDER — SUCRALFATE 1 G PO TABS
1.0000 g | ORAL_TABLET | Freq: Four times a day (QID) | ORAL | Status: DC
  Administered 2017-07-19 – 2017-07-22 (×15): 1 g via ORAL
  Filled 2017-07-19 (×15): qty 1

## 2017-07-19 MED ORDER — RISAQUAD PO CAPS
2.0000 | ORAL_CAPSULE | Freq: Three times a day (TID) | ORAL | Status: DC
  Administered 2017-07-19 – 2017-07-22 (×10): 2 via ORAL
  Filled 2017-07-19 (×9): qty 2

## 2017-07-19 MED ORDER — PANTOPRAZOLE SODIUM 40 MG PO TBEC
40.0000 mg | DELAYED_RELEASE_TABLET | Freq: Two times a day (BID) | ORAL | Status: DC
  Administered 2017-07-19: 40 mg via ORAL
  Filled 2017-07-19: qty 1

## 2017-07-19 MED ORDER — ACETAMINOPHEN 325 MG PO TABS
650.0000 mg | ORAL_TABLET | Freq: Four times a day (QID) | ORAL | Status: DC | PRN
  Administered 2017-07-19 – 2017-07-20 (×2): 650 mg via ORAL
  Filled 2017-07-19 (×2): qty 2

## 2017-07-19 MED ORDER — SODIUM CHLORIDE 0.9 % IV SOLN
100.0000 mg | Freq: Two times a day (BID) | INTRAVENOUS | Status: DC
  Administered 2017-07-19: 100 mg via INTRAVENOUS
  Filled 2017-07-19 (×2): qty 100

## 2017-07-19 MED ORDER — JUVEN PO PACK
1.0000 | PACK | Freq: Every day | ORAL | Status: DC
  Administered 2017-07-20 – 2017-07-22 (×2): 1 via ORAL
  Filled 2017-07-19 (×4): qty 1

## 2017-07-19 MED ORDER — PIPERACILLIN-TAZOBACTAM 3.375 G IVPB
3.3750 g | Freq: Three times a day (TID) | INTRAVENOUS | Status: DC
  Administered 2017-07-19: 3.375 g via INTRAVENOUS
  Filled 2017-07-19: qty 50

## 2017-07-19 MED ORDER — HEPARIN SODIUM (PORCINE) 5000 UNIT/ML IJ SOLN
5000.0000 [IU] | Freq: Three times a day (TID) | INTRAMUSCULAR | Status: DC
  Filled 2017-07-19: qty 1

## 2017-07-19 MED ORDER — ENOXAPARIN SODIUM 40 MG/0.4ML ~~LOC~~ SOLN
40.0000 mg | SUBCUTANEOUS | Status: DC
Start: 2017-07-19 — End: 2017-07-19
  Filled 2017-07-19 (×2): qty 0.4

## 2017-07-19 MED ORDER — LINEZOLID 600 MG/300ML IV SOLN
600.0000 mg | Freq: Two times a day (BID) | INTRAVENOUS | Status: DC
  Filled 2017-07-19 (×2): qty 300

## 2017-07-19 MED ORDER — ONDANSETRON HCL 4 MG/2ML IJ SOLN
4.0000 mg | Freq: Four times a day (QID) | INTRAMUSCULAR | Status: DC | PRN
  Filled 2017-07-19: qty 2
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
## 2017-07-19 NOTE — Progress Notes (Signed)
Patient ID: [name] [name], male   DOB: 1966/04/01, 52 y.o.   MRN: [id number] Request received for CT guided biopsy of right fifth rib expansile lesion noted on recent CT. Images were reviewed by Dr. Vernard Gambles. He recommends waiting on results of bone scan before performing biopsy.We will also plan additional radiologist review in meantime.  Will cont to monitor. Will hold heparin injections and keep pt NPO after MN in case bx performed 3/7.

## 2017-07-19 NOTE — Consult Note (Signed)
[place] Nurse wound consult note Reason for Consult: Well known to [facility] team.  Multiple chronic, nonhealing full thickness pressure injuries.  Reports decline in  right upper flank wound  Wound type:pressure Pressure Injury POA: Yes Stage 4 pressure injury left ischium: 13cm x 5 cm x 0.2cm: 100% pale, clean, pink, non granular, slick in appearance  Stage 4 pressure injury right ischium: 1.5cm x 3cm x 6cm: Pale pink nongranulating wound bed Stage 4 pressure injury sacrum; 11cm x 2cm x 0.1cm: 100% pale, clean, pink, non granulating wound bed Ruptured blood blister to right plantar foot, 0.5 cm in diameter Full thickness skin loss right dorsal foot 2.5cm x 2.0cm wound bed 75% slough 25% pale pink nongranulating  Unstageable pressure injury: right lateral chest: 11cm x 5cm x 0.2cm; 80% pink with gray devitalized tissue to perimeter at 2 o'clock  Wound bed slick and shiny Pressure Injury POA: Yesx 5 Measurement:see above Wound bed:see above Drainage (amount, consistency, odor)sacral and ischial wounds  have heavy, serosanguinous drainage consistent with previous admissions. Periwound:intact with evidence of previous skin breakdown Dressing procedure/placement/frequency: Dakin's solution to debride and fight bacteria on the sacral and ischial wounds, findings consistent with  Contamination and  bioburden. Mupirocin ointment with right dorsal foot and plantar foot blistering Will not follow at this time.  Please re-consult if needed.  Domenic Moras RN BSN [place] Pager [phone]

## 2017-07-19 NOTE — Progress Notes (Signed)
Pharmacy Antibiotic Note  [name] [name] is a 52 y.o. male admitted on 07/18/2017 with bacteremia, sepsis, cellulitis and UTI.  Pharmacy has been consulted for Daptomycin dosing.  Plan: Daptomycin 500mg  (~6mg /kg) iv q24hr  Height: 6' (182.9 cm) Weight: 201 lb 11.5 oz (91.5 kg) IBW/kg (Calculated) : 77.6  Temp (24hrs), Avg:100.8 F (38.2 C), Min:98.4 F (36.9 C), Max:103.1 F (39.5 C)  Recent Labs  Lab 07/18/17 2215 07/18/17 2228 07/19/17 0528  WBC 13.5*  --  12.8*  CREATININE 0.66  --  0.61  LATICACIDVEN  --  0.74  --     Estimated Creatinine Clearance: 121.3 mL/min (by C-G formula based on SCr of 0.61 mg/dL).    Allergies  Allergen Reactions  . Feraheme [Ferumoxytol] Other (See Comments)    SYNCOPE  . Ditropan [Oxybutynin] Other (See Comments)    Hallucinations   . Vancomycin Other (See Comments)    ARF 05-2016 -- affects kidneys Has patient had a PCN reaction causing immediate rash, facial/tongue/throat swelling, SOB or lightheadedness with hypotension: Unk Has patient had a PCN reaction causing severe rash involving mucus membranes or skin necrosis: Unk Has patient had a PCN reaction that required hospitalization: Unk Has patient had a PCN reaction occurring within the last 10 years: Unk If all of the above answers are "NO", then may proceed with Cephalosporin use.     Antimicrobials this admission: 3/5 cefepime >>  3/5 doxy x1  3/6 zosyn x1 3/6 dapto >>   Dose adjustments this admission: -  Microbiology results: 3/5 BCx: sent 3/5 UCx: sent 3/6 CDiff: neg/neg   Thank you for allowing pharmacy to be a part of this patient's care.  Nani Skillern Crowford 07/19/2017 5:00 PM

## 2017-07-19 NOTE — Progress Notes (Signed)
Pharmacy Antibiotic Note  [name] [name] is a 52 y.o. male admitted on 07/18/2017 with sepsis.  Pharmacy has been consulted for zosyn dosing.  Plan: Zosyn 3.375g IV q8h (4 hour infusion).  Weight: 198 lb (89.8 kg)  Temp (24hrs), Avg:101.8 F (38.8 C), Min:100.3 F (37.9 C), Max:103.1 F (39.5 C)  Recent Labs  Lab 07/18/17 2215 07/18/17 2228  WBC 13.5*  --   CREATININE 0.66  --   LATICACIDVEN  --  0.74    Estimated Creatinine Clearance: 121.3 mL/min (by C-G formula based on SCr of 0.66 mg/dL).    Allergies  Allergen Reactions  . Feraheme [Ferumoxytol] Other (See Comments)    SYNCOPE  . Ditropan [Oxybutynin] Other (See Comments)    Hallucinations   . Vancomycin Other (See Comments)    ARF 05-2016 -- affects kidneys Has patient had a PCN reaction causing immediate rash, facial/tongue/throat swelling, SOB or lightheadedness with hypotension: Unk Has patient had a PCN reaction causing severe rash involving mucus membranes or skin necrosis: Unk Has patient had a PCN reaction that required hospitalization: Unk Has patient had a PCN reaction occurring within the last 10 years: Unk If all of the above answers are "NO", then may proceed with Cephalosporin use.     Antimicrobials this admission: 3/5 cefepime >> x1 ED 3/5 doxy >>  3/6 zosyn >>  Dose adjustments this admission:   Microbiology results:  BCx:   UCx:    Sputum:    MRSA PCR:   Thank you for allowing pharmacy to be a part of this patient's care.  Dorrene German 07/19/2017 3:18 AM
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
## 2017-07-20 ENCOUNTER — Inpatient Hospital Stay (HOSPITAL_COMMUNITY): Payer: Medicare Other

## 2017-07-20 ENCOUNTER — Other Ambulatory Visit (HOSPITAL_COMMUNITY): Payer: Medicare Other

## 2017-07-20 ENCOUNTER — Encounter (HOSPITAL_COMMUNITY): Payer: Self-pay

## 2017-07-20 DIAGNOSIS — R222 Localized swelling, mass and lump, trunk: Secondary | ICD-10-CM

## 2017-07-20 DIAGNOSIS — A419 Sepsis, unspecified organism: Secondary | ICD-10-CM

## 2017-07-20 DIAGNOSIS — R946 Abnormal results of thyroid function studies: Secondary | ICD-10-CM

## 2017-07-20 DIAGNOSIS — G8250 Quadriplegia, unspecified: Secondary | ICD-10-CM

## 2017-07-20 DIAGNOSIS — E041 Nontoxic single thyroid nodule: Secondary | ICD-10-CM

## 2017-07-20 DIAGNOSIS — L89154 Pressure ulcer of sacral region, stage 4: Secondary | ICD-10-CM

## 2017-07-20 LAB — CBC WITH DIFFERENTIAL/PLATELET
BASOS ABS: 0 10*3/uL (ref 0.0–0.1)
Basophils Relative: 0 %
Eosinophils Absolute: 0.1 10*3/uL (ref 0.0–0.7)
Eosinophils Relative: 1 %
HEMATOCRIT: 25.2 % — AB (ref 39.0–52.0)
Hemoglobin: 7.5 g/dL — ABNORMAL LOW (ref 13.0–17.0)
LYMPHS ABS: 1.6 10*3/uL (ref 0.7–4.0)
LYMPHS PCT: 23 %
MCH: 24 pg — AB (ref 26.0–34.0)
MCHC: 29.8 g/dL — ABNORMAL LOW (ref 30.0–36.0)
MCV: 80.8 fL (ref 78.0–100.0)
MONO ABS: 0.8 10*3/uL (ref 0.1–1.0)
Monocytes Relative: 12 %
NEUTROS ABS: 4.5 10*3/uL (ref 1.7–7.7)
Neutrophils Relative %: 64 %
Platelets: 275 10*3/uL (ref 150–400)
RBC: 3.12 MIL/uL — ABNORMAL LOW (ref 4.22–5.81)
RDW: 15.9 % — ABNORMAL HIGH (ref 11.5–15.5)
WBC: 7 10*3/uL (ref 4.0–10.5)

## 2017-07-20 LAB — GASTROINTESTINAL PANEL BY PCR, STOOL (REPLACES STOOL CULTURE)
ASTROVIRUS: NOT DETECTED
Adenovirus F40/41: NOT DETECTED
Campylobacter species: NOT DETECTED
Cryptosporidium: NOT DETECTED
Cyclospora cayetanensis: NOT DETECTED
ENTAMOEBA HISTOLYTICA: NOT DETECTED
ENTEROAGGREGATIVE E COLI (EAEC): NOT DETECTED
ENTEROTOXIGENIC E COLI (ETEC): NOT DETECTED
Enteropathogenic E coli (EPEC): NOT DETECTED
GIARDIA LAMBLIA: NOT DETECTED
NOROVIRUS GI/GII: NOT DETECTED
Plesimonas shigelloides: NOT DETECTED
Rotavirus A: NOT DETECTED
SAPOVIRUS (I, II, IV, AND V): NOT DETECTED
Salmonella species: NOT DETECTED
Shiga like toxin producing E coli (STEC): NOT DETECTED
Shigella/Enteroinvasive E coli (EIEC): NOT DETECTED
VIBRIO CHOLERAE: NOT DETECTED
Vibrio species: NOT DETECTED
Yersinia enterocolitica: NOT DETECTED

## 2017-07-20 LAB — T4, FREE: Free T4: 0.88 ng/dL (ref 0.61–1.12)

## 2017-07-20 LAB — TSH: TSH: 1.048 u[IU]/mL (ref 0.350–4.500)

## 2017-07-20 MED ORDER — MUPIROCIN 2 % EX OINT
1.0000 "application " | TOPICAL_OINTMENT | Freq: Two times a day (BID) | CUTANEOUS | Status: DC
  Administered 2017-07-20 – 2017-07-22 (×5): 1 via NASAL
  Filled 2017-07-20: qty 22

## 2017-07-20 MED ORDER — PRO-STAT SUGAR FREE PO LIQD
30.0000 mL | Freq: Two times a day (BID) | ORAL | Status: DC
  Administered 2017-07-21 – 2017-07-22 (×3): 30 mL via ORAL
  Filled 2017-07-20 (×4): qty 30

## 2017-07-20 MED ORDER — TECHNETIUM TC 99M MEDRONATE IV KIT
22.0000 | PACK | Freq: Once | INTRAVENOUS | Status: AC | PRN
  Administered 2017-07-20: 22 via INTRAVENOUS

## 2017-07-20 MED ORDER — SODIUM CHLORIDE 0.9 % IV SOLN
1.0000 g | Freq: Three times a day (TID) | INTRAVENOUS | Status: DC
  Administered 2017-07-20 – 2017-07-22 (×6): 1 g via INTRAVENOUS
  Filled 2017-07-20 (×8): qty 1

## 2017-07-20 MED ORDER — SODIUM CHLORIDE 0.9 % IV BOLUS (SEPSIS)
500.0000 mL | Freq: Once | INTRAVENOUS | Status: AC
Start: 2017-07-20 — End: 2017-07-20
  Administered 2017-07-20: 500 mL via INTRAVENOUS

## 2017-07-20 MED ORDER — CHLORHEXIDINE GLUCONATE CLOTH 2 % EX PADS
6.0000 | MEDICATED_PAD | Freq: Every day | CUTANEOUS | Status: DC
  Administered 2017-07-20 – 2017-07-22 (×3): 6 via TOPICAL

## 2017-07-20 NOTE — Consult Note (Signed)
[place] Nurse ostomy follow up Stoma type/location: LLQ Colostomy Stomal assessment/size: 1 3/8" pale pink and moist.  Producing loose stools at this time.  Pouch had been reinforced with tape.  Removed old pouch to apply secure system Peristomal assessment: intact Treatment options for stomal/peristomal skin: will add barrie ring for improved fit and security Output loose brown stool and flatulence noted in pouch Ostomy pouching: 2pc. 2 1/4" pouch with barrier ring.  Extra set left at bedside as well  Education provided: none needed Enrolled patient in [employer] Discharge program: No Will not follow at this time.  Please re-consult if needed.  Domenic Moras RN BSN [place] Pager [phone]

## 2017-07-20 NOTE — Progress Notes (Addendum)
Initial Nutrition Assessment  DOCUMENTATION CODES:   Not applicable  INTERVENTION:   Prostat po BID, each supplement provides 100 kcal and 15 grams of protein  Continue Juven po BID, each supplement provides 80 kcal and 14 grams of amino acids  Continue MVI po daily    NUTRITION DIAGNOSIS:   Increased nutrient needs related to acute illness, wound healing as evidenced by estimated needs.   GOAL:   Patient will meet greater than or equal to 90% of their needs   MONITOR:   PO intake, Supplement acceptance, Diet advancement, Labs, Skin  REASON FOR ASSESSMENT:   Low Braden    ASSESSMENT:   52 yo male admitted 3/5 with sepsis, UTI, lytic lesion on ribs. Additional active medical problems: quadriplegia w/ colostomy and suprapubic catheter, OSA on CPAP, anemia of chronic disease, sacral decubitus and heel ulcers.  3/6-- Pt seen by oncology and assessment of mass observe on ribs in CT scan is ongoing. Pt remains febrile, on antibiotics, and cause of sepsis is undetermined, but pt positive for MRSA    Spoke with pt who reports  --Usually has a good appetite, lost appetite on Tuesday r/t infection (nausea with fever and chills) --appetite has improved a little in past 24 hrs. Pt ate 50% breakfast and dinner & 100% of lunch prior to NPO status --Despite NPO all day, pt only reports being a little hungry --Denies difficulty swallowing of with digestion  Pt has been losing some weight now that sister has changed his diet. She has been shopping for and preparing foods high in lean proteins and fresh vegetables:  B: Eggs, grits, breakfast meat L/D: Meat, fish, with fresh vegetables (beans, corn, leafy greens) Pt denies snacking regularly or eating between meals since he is trying to lose weight Supplemental Nutrition: Pt takes Juven, a multivitamin, zinc vitamin C daily  Pt reports that he would prefer to take Prostat since there is less sediment associated with ostomy output,  but insurance only covers [place]. However, either of these supplements are preferred over other ONS options since he is trying to increase protein intake without consuming too many calories.  Weight has been stable for past 9 months:  Wt Readings from Last 10 Encounters:  07/20/17 201 lb 15.1 oz (91.6 kg)  06/02/17 198 lb (89.8 kg)  05/03/17 212 lb (96.2 kg)  02/28/17 202 lb (91.6 kg)  02/07/17 202 lb (91.6 kg)  12/13/16 202 lb 8 oz (91.9 kg)  12/06/16 202 lb 8 oz (91.9 kg)  11/10/16 204 lb 3.2 oz (92.6 kg)  10/31/16 210 lb (95.3 kg)  10/18/16 210 lb (95.3 kg)     Medications:  Ferrous sulfate Reglan   Labs reviewed  Physical assessment did not indicate nutrition-related muscle or fat wasting. Muscle depletions secondary to paraplegia.  NUTRITION - FOCUSED PHYSICAL EXAM:    Most Recent Value  Orbital Region  No depletion  Upper Arm Region  No depletion  Thoracic and Lumbar Region  No depletion  Buccal Region  No depletion  Temple Region  No depletion  Clavicle Bone Region  No depletion  Clavicle and Acromion Bone Region  No depletion  Scapular Bone Region  Unable to assess  Dorsal Hand  Moderate depletion [spinal injury-related depletions]  Patellar Region  Severe depletion [spinal injury-related depletions]  Anterior Thigh Region  Moderate depletion [spinal injury-related depletions]  Posterior Calf Region  Severe depletion [spinal injury-related depletions]  Edema (RD Assessment)  None  Hair  Reviewed  Eyes  Reviewed  Mouth  Reviewed  Skin  Reviewed  Nails  Reviewed       Diet Order:  Diet NPO time specified Except for: Sips with Meds  EDUCATION NEEDS:   No education needs have been identified at this time  Skin:  Skin Assessment: Skin Integrity Issues: Skin Integrity Issues:: Stage IV, Unstageable, Other (Comment) Stage IV: Ischium, sacrum 13x5x2 cm Unstageable: Chest 11x5x2 Other: heel ulcers  Last BM:  3/7  Height:   Ht Readings from Last 1  Encounters:  07/19/17 6' (1.829 m)    Weight:   Wt Readings from Last 1 Encounters:  07/20/17 201 lb 15.1 oz (91.6 kg)    Ideal Body Weight:  68.7 kg  BMI:  Body mass index is 27.39 kg/m.  Estimated Nutritional Needs:   Kcal:  2,100-2,400 kcal (30-35 kcal/kg adjusted BW for paraplegia)  Protein:  120-140 grams (1.5-1.7 g/kg actual BW)  Fluid:  >=2.1 Carlean Jews    Edmonia Lynch, MS Dietetic Intern Pager: [phone]
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
## 2017-07-20 NOTE — Progress Notes (Signed)
Patient ID: [name] [name], male   DOB: 28-Oct-1965, 52 y.o.   MRN: [id number]   Asked to see pt for rib biopsy. CT shows benign appearing expansile R rib lesion, seen on prior CT from 2012, with some interval growth. No abnormal activity on bone scan. No additional worrisome lesions. Reviewed films with musculoskeletal radiologist Dr. Dyke Brackett who concurs this likely represents a benign lesion. In the absence of pain or symptoms, no biopsy indicated. If symptomatic, recommend MRI for better characterization.

## 2017-07-20 NOTE — Progress Notes (Addendum)
Patient ID: [name] [name], male   DOB: 1965-09-12, 52 y.o.   MRN: [id number] Pt will tent be scheduled for thyroid biopsy on 3/8 if stable/afebrile; he will need to be off heparin injection preferably until after biopsy but may have dose this afternoon if necessary. He does not need to be NPO for thyroid bx. Above d/w nurse.

## 2017-07-20 NOTE — Progress Notes (Signed)
INFECTIOUS DISEASE PROGRESS NOTE  ID: [name] [name] is a 52 y.o. male with  Principal Problem:   Sepsis ([place]) Active Problems:   Quadriplegia ([place])   OSA on CPAP   Anemia of chronic disease   Sacral decubitus ulcer   Acute lower UTI  Subjective: Without complaints  Abtx:  Anti-infectives (From admission, onward)   Start     Dose/Rate Route Frequency Ordered Stop   07/20/17 1800  ceFEPIme (MAXIPIME) 1 g in sodium chloride 0.9 % 100 mL IVPB     1 g 200 mL/hr over 30 Minutes Intravenous Every 8 hours 07/20/17 1206     07/19/17 1800  DAPTOmycin (CUBICIN) 500 mg in sodium chloride 0.9 % IVPB     500 mg 220 mL/hr over 30 Minutes Intravenous Every 24 hours 07/19/17 1658     07/19/17 1545  linezolid (ZYVOX) IVPB 600 mg  Status:  Discontinued     600 mg 300 mL/hr over 60 Minutes Intravenous 2 times daily 07/19/17 1531 07/19/17 1635   07/19/17 1415  ceFEPIme (MAXIPIME) 1 g in sodium chloride 0.9 % 100 mL IVPB  Status:  Discontinued     1 g 200 mL/hr over 30 Minutes Intravenous Every 12 hours 07/19/17 1409 07/20/17 1206   07/19/17 0600  piperacillin-tazobactam (ZOSYN) IVPB 3.375 g  Status:  Discontinued     3.375 g 12.5 mL/hr over 240 Minutes Intravenous Every 8 hours 07/19/17 0315 07/19/17 1409   07/19/17 0500  doxycycline (VIBRAMYCIN) 100 mg in sodium chloride 0.9 % 250 mL IVPB  Status:  Discontinued     100 mg 125 mL/hr over 120 Minutes Intravenous 2 times daily 07/19/17 0306 07/19/17 1635   07/18/17 2130  ceFEPIme (MAXIPIME) 2 g in sodium chloride 0.9 % 100 mL IVPB     2 g 200 mL/hr over 30 Minutes Intravenous  Once 07/18/17 2120 07/18/17 2334      Medications:  Scheduled: . acidophilus  2 capsule Oral TID  . baclofen  20 mg Oral QID  . Chlorhexidine Gluconate Cloth  6 each Topical Q0600  . ferrous sulfate  325 mg Oral TID WC  . fesoterodine  8 mg Oral Daily  . metoCLOPramide  10 mg Oral Q6H  . multivitamin with minerals  1 tablet Oral q morning - 10a  . mupirocin  ointment  1 application Nasal BID  . mupirocin ointment   Topical Daily  . nutrition supplement (JUVEN)  1 packet Oral Daily  . sodium hypochlorite   Irrigation q1800  . sucralfate  1 g Oral QID    Objective: Vital signs in last 24 hours: Temp:  [98.3 F (36.8 C)-102.4 F (39.1 C)] 98.3 F (36.8 C) (03/07 0618) Pulse Rate:  [33-113] 89 (03/07 0618) Resp:  [15-20] 16 (03/07 0618) BP: (91-151)/(61-88) 145/88 (03/07 0618) SpO2:  [89 %-100 %] 99 % (03/07 0618) Weight:  [91.5 kg (201 lb 11.5 oz)-91.6 kg (201 lb 15.1 oz)] 91.6 kg (201 lb 15.1 oz) (03/07 0500)   General appearance: alert, cooperative and no distress Chest wall: R sided port clean  Lab Results Recent Labs    07/18/17 2215 07/19/17 0528 07/20/17 0817  WBC 13.5* 12.8* 7.0  HGB 9.0* 8.5* 7.5*  HCT 29.4* 28.8* 25.2*  NA 139 138  --   K 4.3 3.8  --   CL 108 109  --   CO2 21* 22  --   BUN 23* 22*  --   CREATININE 0.66 0.61  --  Liver Panel Recent Labs    07/18/17 2215  PROT 8.5*  ALBUMIN 3.3*  AST 25  ALT 26  ALKPHOS 82  BILITOT 0.5   Sedimentation Rate No results for input(s): ESRSEDRATE in the last 72 hours. C-Reactive Protein No results for input(s): CRP in the last 72 hours.  Microbiology: Recent Results (from the past 240 hour(s))  Culture, blood (routine x 2)     Status: None (Preliminary result)   Collection Time: 07/18/17 10:16 PM  Result Value Ref Range Status   Specimen Description   Final    BLOOD RIGHT CHEST Performed at [facility] [address], [place], [place] [address]    Special Requests   Final    BOTTLES DRAWN AEROBIC AND ANAEROBIC Blood Culture adequate volume Performed at [facility] [name], [place], [place] [address]    Culture   Final    NO GROWTH 2 DAYS Performed at [facility] [address] [place], [place] [address]    Report Status PENDING  Incomplete  Urine culture     Status: Abnormal (Preliminary  result)   Collection Time: 07/18/17 11:38 PM  Result Value Ref Range Status   Specimen Description   Final    URINE, CATHETERIZED Performed at [facility] [address], [place], [place] [address]    Special Requests   Final    NONE Performed at [facility], [place] [address] [place], [place] [address]    Culture >=100,000 COLONIES/mL PROTEUS MIRABILIS (A)  Final   Report Status PENDING  Incomplete  C difficile quick scan w PCR reflex     Status: None   Collection Time: 07/19/17  9:57 AM  Result Value Ref Range Status   C Diff antigen NEGATIVE NEGATIVE Final   C Diff toxin NEGATIVE NEGATIVE Final   C Diff interpretation No C. difficile detected.  Final    Comment: Performed at [facility], [place] [address], [place], [place] [address]  MRSA PCR Screening     Status: Abnormal   Collection Time: 07/19/17  5:20 PM  Result Value Ref Range Status   MRSA by PCR POSITIVE (A) NEGATIVE Final    Comment:        The GeneXpert MRSA Assay (FDA approved for NASAL specimens only), is one component of a comprehensive MRSA colonization surveillance program. It is not intended to diagnose MRSA infection nor to guide or monitor treatment for MRSA infections. RESULT CALLED TO, READ BACK BY AND VERIFIED WITH: Bing Quarry [id number] @ [facility] Performed at [facility] [address] [place], [place] [address]     Studies/Results: Dg Abd 1 View  Result Date: 07/19/2017 CLINICAL DATA:  Abdomen distension EXAM: ABDOMEN - 1 VIEW COMPARISON:  CT 05/04/2017, 08/27/2016, radiograph 02/02/2016 FINDINGS: Marked air distention of the stomach as before. Scattered gas within the small bowel and colon. 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 IMPRESSION: 1. Marked air distention of the stomach, similar compared to previous exams. Remainder of the gas pattern is  nonobstructed 2. Chronic deformities of the pelvis and bilateral hips. Electronically Signed   By: Donavan Foil M.D.   On: 07/19/2017 03:38   Ct Chest Wo Contrast  Result Date: 07/19/2017 CLINICAL DATA:  Shortness of breath.  Fever. EXAM: CT CHEST WITHOUT CONTRAST TECHNIQUE: Multidetector CT imaging of the chest was performed following the standard protocol without  IV contrast. COMPARISON:  CT scan of October 26, 2010.  MRI of November 01, 2016. FINDINGS: Cardiovascular: No significant vascular findings. Normal heart size. No pericardial effusion. Right internal jugular Port-A-Cath is noted. Mediastinum/Nodes: 2 cm left thyroid nodule is noted. No mediastinal adenopathy is noted. Esophagus is unremarkable. Lungs/Pleura: No pneumothorax is noted. No significant pleural effusion is noted. Minimal bilateral posterior basilar subsegmental atelectasis is noted. Upper Abdomen: No acute abnormality. Musculoskeletal: No acute fracture is seen involving the ribs or spine. 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 Expansile lesion measuring 8.9 x 4.9 cm is seen involving the posterior portion of the right fifth rib which extends to the anterior surface of the inferior portion of the scapula. This is significantly increased in size compared to prior exam of 2012. IMPRESSION: 2 cm left thyroid nodule is noted. Thyroid ultrasound is recommended for further evaluation. Minimal bilateral posterior basilar subsegmental atelectasis is noted. 8.9 x 4.9 cm expansile lesion is seen involving the posterior portion of right fifth rib which extends to the anterior surface of the inferior portion of the scapula. This is significantly increased in size compared to prior exam and is concerning for enlarging enchondroma, low grade chondrosarcoma or possibly chondromyxoid fibroma. MRI may be performed for further evaluation. Electronically Signed   By: Marijo Conception, M.D.   On:  07/19/2017 11:04   Dg Chest Port 1 View  Result Date: 07/18/2017 CLINICAL DATA:  Weakness fever and chills EXAM: PORTABLE CHEST 1 VIEW COMPARISON:  05/04/2017 FINDINGS: Right-sided central venous port tip over the SVC. Hazy bibasilar opacity. Enlarged cardiomediastinal silhouette. No pneumothorax. Expansile lytic right fifth rib lesion again noted. Left 6 and seventh rib anomaly again noted. IMPRESSION: 1. Hazy bibasilar airspace disease may reflect atelectasis or pneumonia 2. Mild cardiomegaly 3. Right fifth rib expansile and lytic lesion again noted. Electronically Signed   By: Donavan Foil M.D.   On: 07/18/2017 21:14   US Thyroid  Result Date: 07/19/2017 CLINICAL DATA:  Incidental on CT. 52 year old male with left thyroid nodule seen on prior CT scan. EXAM: THYROID ULTRASOUND TECHNIQUE: Ultrasound examination of the thyroid gland and adjacent soft tissues was performed. COMPARISON:  CT scan of the chest 07/19/2016 FINDINGS: Parenchymal Echotexture: Mildly heterogenous Isthmus: 0.5 cm Right lobe: 4.2 x 1.4 x 2.3 cm Left lobe: 4.1 x 2.9 x 3.1 cm _________________________________________________________ Estimated total number of nodules >/= 1 cm: 1 Number of spongiform nodules >/=  2 cm not described below (TR1): 0 Number of mixed cystic and solid nodules >/= 1.5 cm not described below ([place]): 0 _________________________________________________________ Nodule # 1: Location: Left; Inferior Maximum size: 2.0 cm; Other 2 dimensions: 1.7 x 1.6 cm Composition: solid/almost completely solid (2) Echogenicity: very hypoechoic (3) Shape: not taller-than-wide (0) Margins: smooth (0) Echogenic foci: none (0) ACR TI-RADS total points: 5. ACR TI-RADS risk category: TR4 (4-6 points). ACR TI-RADS recommendations: **Given size (>/= 1.5 cm) and appearance, fine needle aspiration of this moderately suspicious nodule should be considered based on TI-RADS criteria. _________________________________________________________ Tiny  0.6 cm hypoechoic nodule in the right mid gland does not meet criteria for further evaluation. IMPRESSION: The 2.0 cm TI-RADS category 4 nodule in the left inferior gland meets criteria for consideration of fine-needle aspiration biopsy. The above is in keeping with the ACR TI-RADS recommendations - J Am Coll Radiol 2017;14:587-595. Electronically Signed   By: Jacqulynn Cadet M.D.   On: 07/19/2017 21:51  Assessment/Plan: Fever Decubitus ulcers (sacrum, R thorax) Recent C diff (06-24-17) Previous UTIs 5th rib lesion L thyroid nodule  Total days of antibiotics: dapto/cefepime  Continues to have fever, WBC has improved.  Await w/u of rib lesion and thyroid nodule, going to bone scan currently.  Await BCx UC > 100k proteus, await sensi (? Colonization) Appreciate wound care f/u         [name] MD, FACP Infectious Diseases (pager) [phone] [email or web address] 07/20/2017, 12:10 PM  LOS: 1 day

## 2017-07-21 ENCOUNTER — Inpatient Hospital Stay (HOSPITAL_COMMUNITY): Payer: Medicare Other

## 2017-07-21 ENCOUNTER — Encounter (HOSPITAL_COMMUNITY): Payer: Self-pay

## 2017-07-21 DIAGNOSIS — A0471 Enterocolitis due to Clostridium difficile, recurrent: Secondary | ICD-10-CM

## 2017-07-21 DIAGNOSIS — R140 Abdominal distension (gaseous): Secondary | ICD-10-CM

## 2017-07-21 DIAGNOSIS — Z95828 Presence of other vascular implants and grafts: Secondary | ICD-10-CM

## 2017-07-21 DIAGNOSIS — M9989 Other biomechanical lesions of abdomen and other regions: Secondary | ICD-10-CM

## 2017-07-21 DIAGNOSIS — R222 Localized swelling, mass and lump, trunk: Secondary | ICD-10-CM

## 2017-07-21 LAB — CK: CK TOTAL: 59 U/L (ref 49–397)

## 2017-07-21 LAB — URINE CULTURE

## 2017-07-21 LAB — T3, FREE: T3, Free: 1.4 pg/mL — ABNORMAL LOW (ref 2.0–4.4)

## 2017-07-21 IMAGING — CR DG ABDOMEN 1V
1 series · 3 of 3 positions shown · non-contrast
Comparison: CT 11/15/2014

CLINICAL DATA: Nausea, onset [DATE]

EXAM:
ABDOMEN - 1 VIEW

[Series 1: AP · U · 3 of 3 slices shown]
[im 1/3]
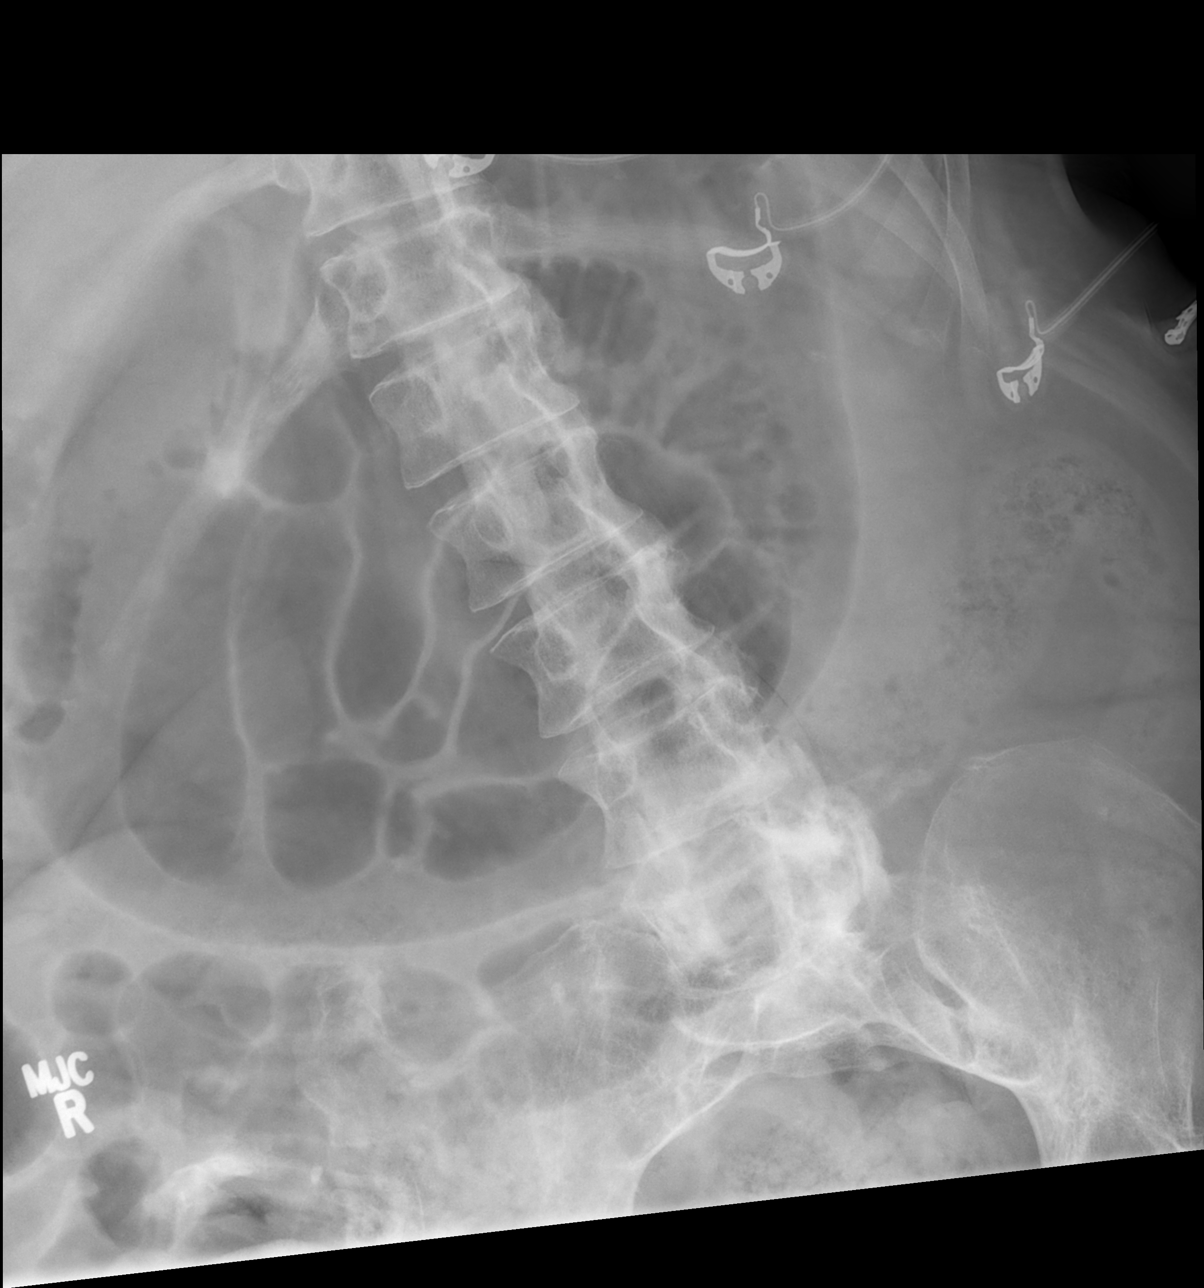
[im 2/3]
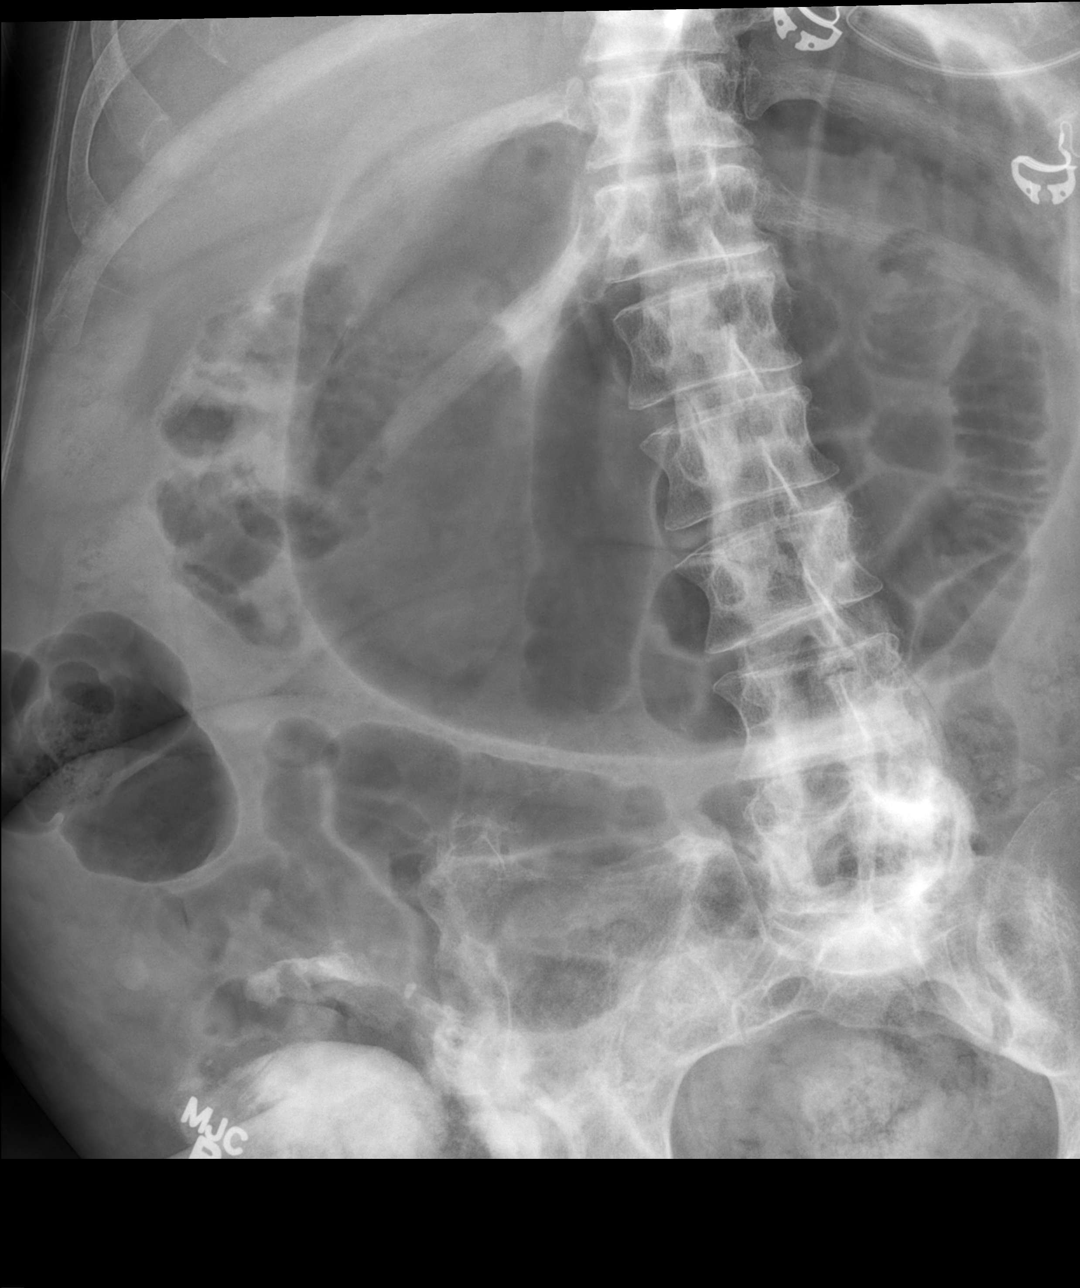
[im 3/3]
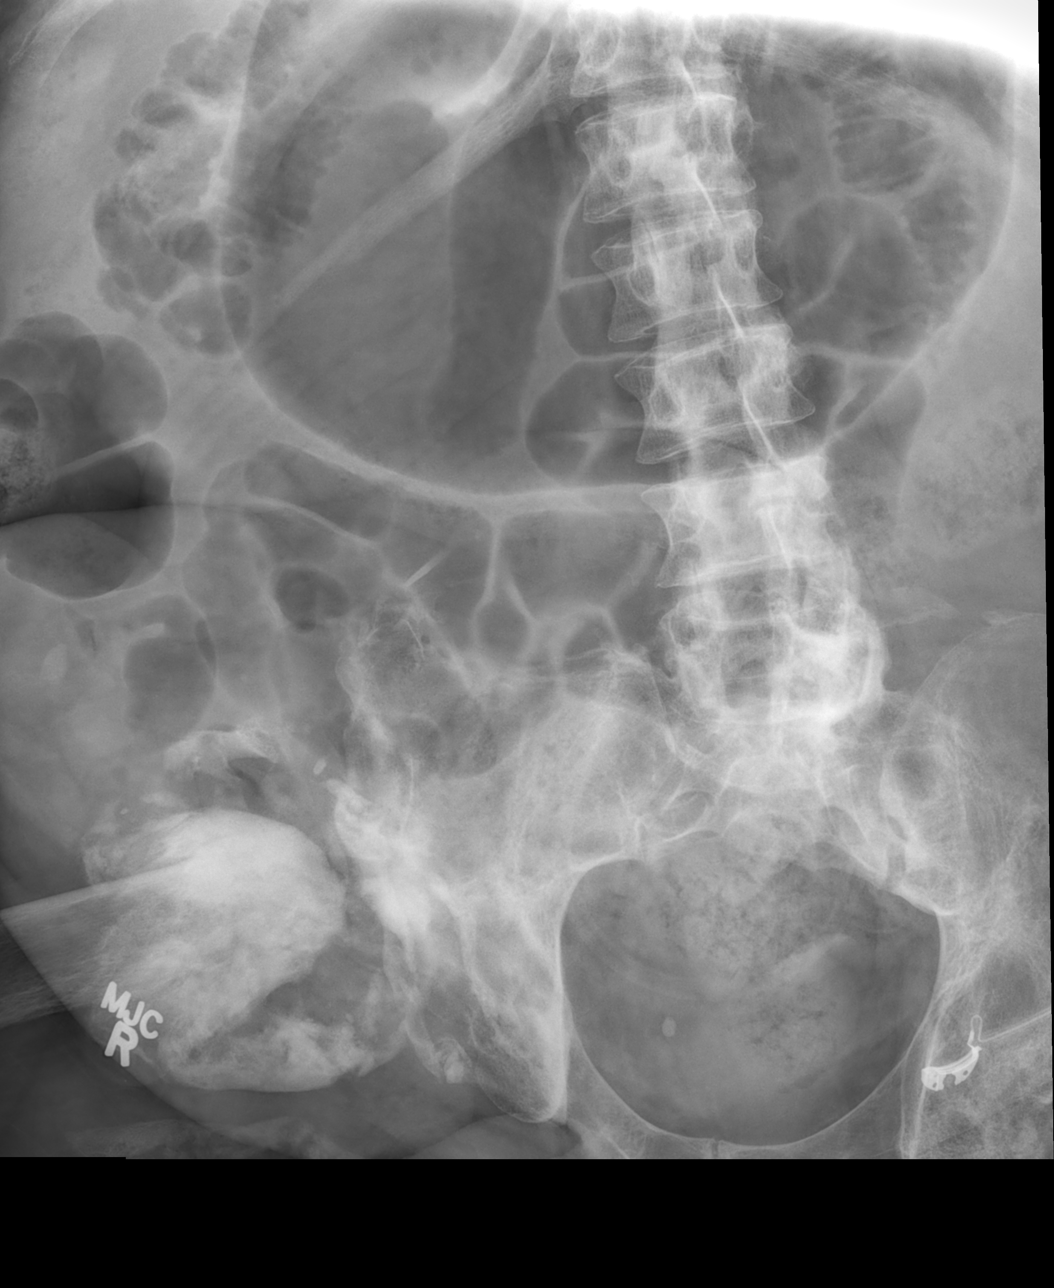

[3 of 3 positions shown; findings below may reference images not displayed]

FINDINGS: There is prominent gastric distention with air. There is a milder
degree of gaseous distention of small and large bowel. No free air
is evident.
IMPRESSION: Gaseous distention of the entire bowel, most prominent in the
stomach. This is probably worsened from 11/15/2014. Gastritis or
gastric paresis is a possibility. Small bowel obstruction is not
likely.

## 2017-07-21 IMAGING — CR DG CHEST 1V PORT
1 series · 1 of 1 positions shown · non-contrast
Comparison: 09/05/2014

CLINICAL DATA: Dyspnea.  Recent fever, nausea and hypotension.

EXAM:
PORTABLE CHEST - 1 VIEW

[AP]
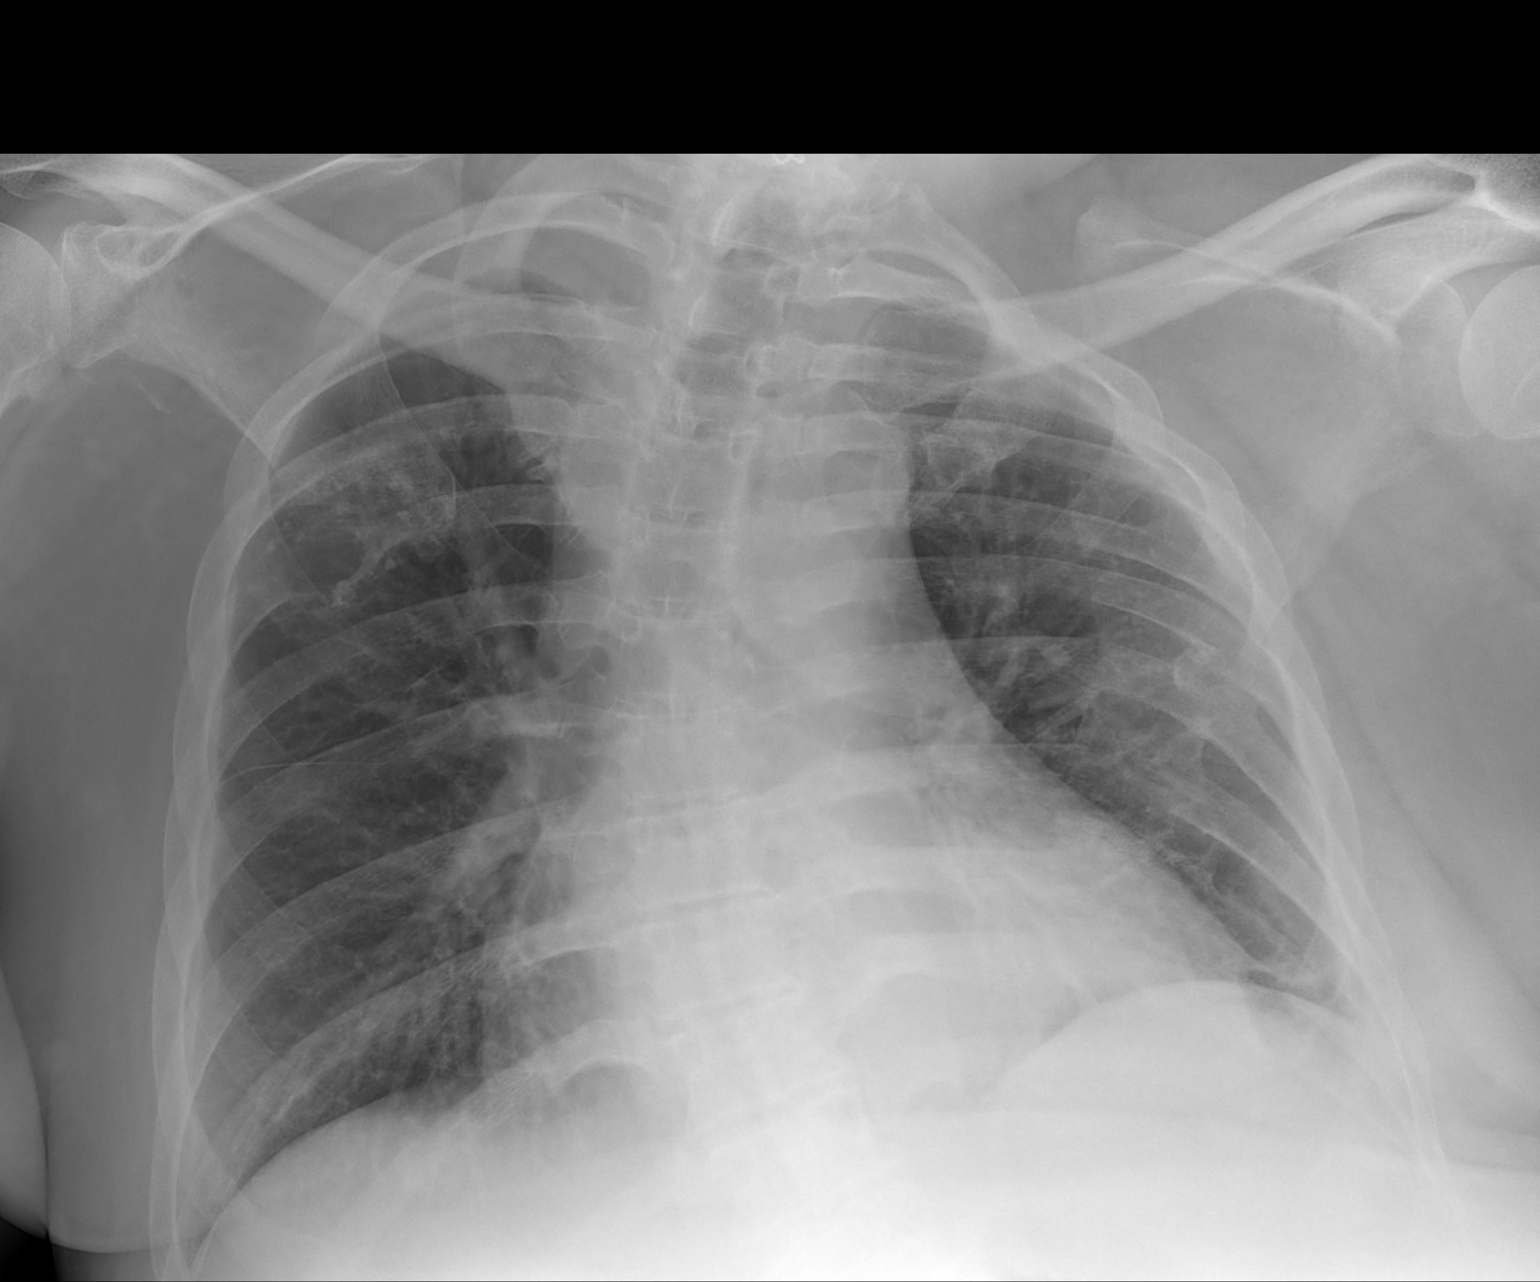

[1 of 1 positions shown; findings below may reference images not displayed]

FINDINGS: There is mild curvilinear left base opacity due to scarring or
atelectasis. There is no confluent airspace consolidation. Heart
size is mildly enlarged and unchanged. Pulmonary vasculature is
normal. There is no large effusion.
IMPRESSION: Mild linear scarring or atelectasis in the left base.

## 2017-07-21 MED ORDER — LIDOCAINE HCL 1 % IJ SOLN
INTRAMUSCULAR | Status: AC
  Filled 2017-07-21: qty 10

## 2017-07-21 NOTE — Progress Notes (Signed)
Patient ID: [name] [name], male   DOB: 1966/03/29, 52 y.o.   MRN: [id number] Pt scheduled for US guided left thyroid nodule bx today. Risks and benefits discussed with the patient including, but not limited to bleeding, infection, damage to adjacent structures or low yield requiring additional tests.  All of the patient's questions were answered, patient is agreeable to proceed. Consent signed and in chart.  See note from Dr. Vernard Gambles yesterday regarding rt fifth rib lesion
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
## 2017-07-21 NOTE — Progress Notes (Signed)
INFECTIOUS DISEASE PROGRESS NOTE  ID: AUTHER LYERLY is a 52 y.o. male with  Principal Problem:   Sepsis ([place]) Active Problems:   Quadriplegia ([place])   OSA on CPAP   Anemia of chronic disease   Sacral decubitus ulcer   Acute lower UTI  Subjective: No complaints.  No BM for 48h  Abtx:  Anti-infectives (From admission, onward)   Start     Dose/Rate Route Frequency Ordered Stop   07/20/17 1800  ceFEPIme (MAXIPIME) 1 g in sodium chloride 0.9 % 100 mL IVPB     1 g 200 mL/hr over 30 Minutes Intravenous Every 8 hours 07/20/17 1206     07/19/17 1800  DAPTOmycin (CUBICIN) 500 mg in sodium chloride 0.9 % IVPB     500 mg 220 mL/hr over 30 Minutes Intravenous Every 24 hours 07/19/17 1658     07/19/17 1545  linezolid (ZYVOX) IVPB 600 mg  Status:  Discontinued     600 mg 300 mL/hr over 60 Minutes Intravenous 2 times daily 07/19/17 1531 07/19/17 1635   07/19/17 1415  ceFEPIme (MAXIPIME) 1 g in sodium chloride 0.9 % 100 mL IVPB  Status:  Discontinued     1 g 200 mL/hr over 30 Minutes Intravenous Every 12 hours 07/19/17 1409 07/20/17 1206   07/19/17 0600  piperacillin-tazobactam (ZOSYN) IVPB 3.375 g  Status:  Discontinued     3.375 g 12.5 mL/hr over 240 Minutes Intravenous Every 8 hours 07/19/17 0315 07/19/17 1409   07/19/17 0500  doxycycline (VIBRAMYCIN) 100 mg in sodium chloride 0.9 % 250 mL IVPB  Status:  Discontinued     100 mg 125 mL/hr over 120 Minutes Intravenous 2 times daily 07/19/17 0306 07/19/17 1635   07/18/17 2130  ceFEPIme (MAXIPIME) 2 g in sodium chloride 0.9 % 100 mL IVPB     2 g 200 mL/hr over 30 Minutes Intravenous  Once 07/18/17 2120 07/18/17 2334      Medications:  Scheduled: . acidophilus  2 capsule Oral TID  . baclofen  20 mg Oral QID  . Chlorhexidine Gluconate Cloth  6 each Topical Q0600  . feeding supplement (PRO-STAT SUGAR FREE 64)  30 mL Oral BID  . ferrous sulfate  325 mg Oral TID WC  . fesoterodine  8 mg Oral Daily  . lidocaine      . metoCLOPramide   10 mg Oral Q6H  . multivitamin with minerals  1 tablet Oral q morning - 10a  . mupirocin ointment  1 application Nasal BID  . mupirocin ointment   Topical Daily  . nutrition supplement (JUVEN)  1 packet Oral Daily  . sodium hypochlorite   Irrigation q1800  . sucralfate  1 g Oral QID    Objective: Vital signs in last 24 hours: Temp:  [98 F (36.7 C)-98.7 F (37.1 C)] 98.7 F (37.1 C) (03/08 1335) Pulse Rate:  [82-84] 84 (03/08 1335) Resp:  [18-28] 18 (03/08 1335) BP: (109-147)/(65-109) 147/109 (03/08 1335) SpO2:  [100 %] 100 % (03/08 1335)   General appearance: alert, cooperative and no distress Resp: clear to auscultation bilaterally Chest wall: R chest port, non-tender. no fluctuance.  Cardio: regular rate and rhythm GI: normal findings: bowel sounds normal and soft, non-tender and abnormal findings:  distended  Lab Results Recent Labs    07/18/17 2215 07/19/17 0528 07/20/17 0817  WBC 13.5* 12.8* 7.0  HGB 9.0* 8.5* 7.5*  HCT 29.4* 28.8* 25.2*  NA 139 138  --   K 4.3 3.8  --  CL 108 109  --   CO2 21* 22  --   BUN 23* 22*  --   CREATININE 0.66 0.61  --    Liver Panel Recent Labs    07/18/17 2215  PROT 8.5*  ALBUMIN 3.3*  AST 25  ALT 26  ALKPHOS 82  BILITOT 0.5   Sedimentation Rate No results for input(s): ESRSEDRATE in the last 72 hours. C-Reactive Protein No results for input(s): CRP in the last 72 hours.  Microbiology: Recent Results (from the past 240 hour(s))  Culture, blood (routine x 2)     Status: None (Preliminary result)   Collection Time: 07/18/17 10:16 PM  Result Value Ref Range Status   Specimen Description   Final    BLOOD RIGHT CHEST Performed at [facility] [address], [place], [place] [address]    Special Requests   Final    BOTTLES DRAWN AEROBIC AND ANAEROBIC Blood Culture adequate volume Performed at [facility] [address], [place], [place] [address]    Culture   Final    NO  GROWTH 2 DAYS Performed at [facility] [address], [place], [place] [address]    Report Status PENDING  Incomplete  Urine culture     Status: Abnormal   Collection Time: 07/18/17 11:38 PM  Result Value Ref Range Status   Specimen Description   Final    URINE, CATHETERIZED Performed at [facility] [address], [place], [place] [address]    Special Requests   Final    NONE Performed at [facility], [place] [address] [place], [place] [address]    Culture >=100,000 COLONIES/mL PROTEUS MIRABILIS (A)  Final   Report Status 07/21/2017 FINAL  Final   Organism ID, Bacteria PROTEUS MIRABILIS (A)  Final      Susceptibility   Proteus mirabilis - MIC*    AMPICILLIN >=32 RESISTANT Resistant     CEFAZOLIN 16 SENSITIVE Sensitive     CEFTRIAXONE <=1 SENSITIVE Sensitive     CIPROFLOXACIN >=4 RESISTANT Resistant     GENTAMICIN 8 INTERMEDIATE Intermediate     IMIPENEM 4 SENSITIVE Sensitive     NITROFURANTOIN 128 RESISTANT Resistant     TRIMETH/SULFA >=320 RESISTANT Resistant     AMPICILLIN/SULBACTAM >=32 RESISTANT Resistant     PIP/TAZO <=4 SENSITIVE Sensitive     * >=100,000 COLONIES/mL PROTEUS MIRABILIS  C difficile quick scan w PCR reflex     Status: None   Collection Time: 07/19/17  9:57 AM  Result Value Ref Range Status   C Diff antigen NEGATIVE NEGATIVE Final   C Diff toxin NEGATIVE NEGATIVE Final   C Diff interpretation No C. difficile detected.  Final    Comment: Performed at [facility], [place] [address] [place], [place] [address]  MRSA PCR Screening     Status: Abnormal   Collection Time: 07/19/17  5:20 PM  Result Value Ref Range Status   MRSA by PCR POSITIVE (A) NEGATIVE Final    Comment:        The GeneXpert MRSA Assay (FDA approved for NASAL specimens only), is one component of a comprehensive MRSA colonization surveillance program. It is not intended to diagnose MRSA infection nor to guide or monitor  treatment for MRSA infections. RESULT CALLED TO, READ BACK BY AND VERIFIED WITH: Bing Quarry [id number] @ [place] Performed at [facility] [address] [place], [place] [address]  Gastrointestinal Panel by PCR , Stool     Status: None   Collection Time: 07/20/17  6:37 AM  Result Value Ref Range Status   Campylobacter species NOT DETECTED NOT DETECTED Final   Plesimonas shigelloides NOT DETECTED NOT DETECTED Final   Salmonella species NOT DETECTED NOT DETECTED Final   Yersinia enterocolitica NOT DETECTED NOT DETECTED Final   Vibrio species NOT DETECTED NOT DETECTED Final   Vibrio cholerae NOT DETECTED NOT DETECTED Final   Enteroaggregative E coli (EAEC) NOT DETECTED NOT DETECTED Final   Enteropathogenic E coli (EPEC) NOT DETECTED NOT DETECTED Final   Enterotoxigenic E coli (ETEC) NOT DETECTED NOT DETECTED Final   Shiga like toxin producing E coli (STEC) NOT DETECTED NOT DETECTED Final   Shigella/Enteroinvasive E coli (EIEC) NOT DETECTED NOT DETECTED Final   Cryptosporidium NOT DETECTED NOT DETECTED Final   Cyclospora cayetanensis NOT DETECTED NOT DETECTED Final   Entamoeba histolytica NOT DETECTED NOT DETECTED Final   Giardia lamblia NOT DETECTED NOT DETECTED Final   Adenovirus F40/41 NOT DETECTED NOT DETECTED Final   Astrovirus NOT DETECTED NOT DETECTED Final   Norovirus GI/GII NOT DETECTED NOT DETECTED Final   Rotavirus A NOT DETECTED NOT DETECTED Final   Sapovirus (I, II, IV, and V) NOT DETECTED NOT DETECTED Final    Comment: Performed at [facility], [address] [place], [place] [address]    Studies/Results: Nm Bone Scan Whole Body  Result Date: 07/20/2017 CLINICAL DATA:  Rib mass identified on CT. EXAM: NUCLEAR MEDICINE WHOLE BODY BONE SCAN TECHNIQUE: Whole body anterior and posterior images were obtained approximately 3 hours after intravenous injection of radiopharmaceutical. RADIOPHARMACEUTICALS:  Twenty-two mCi  Technetium-30m MDP IV COMPARISON:  07/19/2017 FINDINGS: There are no specific findings identified to suggest metastatic disease. Photopenic area corresponds to the expansile lesion involving the right posterior fifth rib. There are areas of increased uptake localizing to the posterolateral aspect of the eighth, ninth and tenth right ribs. On the corresponding CT image there is overlying soft tissue ulceration. Small focus of mild increased uptake localizing to the posterior aspect of the T12 vertebra is identified. Increased radiotracer uptake localizing to both hips likely reflects a combination of chronic osteomyelitis and arthropathic changes. Physiologic tracer activity within both kidneys and the urinary bladder noted. IMPRESSION: 1. The expansile lesion involving the right posterior fifth rib on recent chest CT is relatively photopenic on bone scan. This is a nonspecific finding and does not necessarily confer benignity. 2. Increased uptake localizing to right posterolateral eighth, ninth and tenth ribs is favored to represent sequelae of chronic osteomyelitis. There also chronic posttraumatic and postinflammatory arthropathic changes involving both sides of pelvis and hips. Electronically Signed   By: Kerby Moors M.D.   On: 07/20/2017 15:01   US Thyroid  Result Date: 07/19/2017 CLINICAL DATA:  Incidental on CT. 52 year old male with left thyroid nodule seen on prior CT scan. EXAM: THYROID ULTRASOUND TECHNIQUE: Ultrasound examination of the thyroid gland and adjacent soft tissues was performed. COMPARISON:  CT scan of the chest 07/19/2016 FINDINGS: Parenchymal Echotexture: Mildly heterogenous Isthmus: 0.5 cm Right lobe: 4.2 x 1.4 x 2.3 cm Left lobe: 4.1 x 2.9 x 3.1 cm _________________________________________________________ Estimated total number of nodules >/= 1 cm: 1 Number of spongiform nodules >/=  2 cm not described below (TR1): 0 Number of mixed cystic and solid nodules >/= 1.5 cm not described  below ([place]): 0 _________________________________________________________ Nodule # 1: Location: Left; Inferior Maximum size: 2.0 cm; Other 2 dimensions: 1.7  x 1.6 cm Composition: solid/almost completely solid (2) Echogenicity: very hypoechoic (3) Shape: not taller-than-wide (0) Margins: smooth (0) Echogenic foci: none (0) ACR TI-RADS total points: 5. ACR TI-RADS risk category: TR4 (4-6 points). ACR TI-RADS recommendations: **Given size (>/= 1.5 cm) and appearance, fine needle aspiration of this moderately suspicious nodule should be considered based on TI-RADS criteria. _________________________________________________________ Tiny 0.6 cm hypoechoic nodule in the right mid gland does not meet criteria for further evaluation. IMPRESSION: The 2.0 cm TI-RADS category 4 nodule in the left inferior gland meets criteria for consideration of fine-needle aspiration biopsy. The above is in keeping with the ACR TI-RADS recommendations - J Am Coll Radiol 2017;14:587-595. Electronically Signed   By: Jacqulynn Cadet M.D.   On: 07/19/2017 21:51     Assessment/Plan: Fever Decubitus ulcers (sacrum, R thorax) Recent C diff (06-24-17) Previous UTIs- UCx P mirabilis 5th rib lesion- not felt to be tumor by Onc L thyroid nodule  Total days of antibiotics: 2 dapto/cefepime   Thyroid bx pending.  WBC now normal Will stop dapto CK normal Appears to be improving.  Dr Linus Salmons available if questions over w/e.          [name] MD, FACP Infectious Diseases (pager) [phone] [email or web address] 07/21/2017, 2:12 PM  LOS: 2 days

## 2017-07-21 NOTE — Progress Notes (Signed)
Events noted. Bone scan obtained on 07/20/2017 was reviewed and did not show any evidence of malignancy. The lesion noted on the fifth rib on the CT scan is showing minimal groth compared to 2012. Thyroid nodule biopsy is scheduled today.  Given these findings, no further oncology workup is needed at this time. Please call with any questions regarding this pleasant gentleman.

## 2017-07-21 NOTE — Procedures (Signed)
Ultrasound-guided needle aspirate biopsy performed of left inferior thyroid nodule x4 via 22-gauge Inrad needles.  No immediate complications.  Final pathology pending.  Medication used-1% lidocaine to skin and subcutaneous tissue.

## 2017-07-22 DIAGNOSIS — Z433 Encounter for attention to colostomy: Secondary | ICD-10-CM

## 2017-07-22 DIAGNOSIS — N390 Urinary tract infection, site not specified: Secondary | ICD-10-CM

## 2017-07-22 DIAGNOSIS — T83511A Infection and inflammatory reaction due to indwelling urethral catheter, initial encounter: Principal | ICD-10-CM

## 2017-07-22 DIAGNOSIS — B964 Proteus (mirabilis) (morganii) as the cause of diseases classified elsewhere: Secondary | ICD-10-CM

## 2017-07-22 IMAGING — CT CT ABD-PELV W/ CM
2 of 6 series · 17 of 46 positions shown, 19 images · IV contrast (OMNIPAQUE)
Comparison: Abdominal radiographs 12/07/2014, most recent CT
11/15/2014

CLINICAL DATA: Fever, nausea. 49-year-old male quadriplegic with
pressure ulcers.

EXAM:
CT ABDOMEN AND PELVIS WITH CONTRAST
TECHNIQUE: Multidetector CT imaging of the abdomen and pelvis was performed
using the standard protocol following bolus administration of
intravenous contrast.
CONTRAST:  100mL OMNIPAQUE IOHEXOL 300 MG/ML  SOLN

[Series 2: rtn a/p with · axial · 0.96mm/px · z∈[-496,-81]mm · 14 of 95 slices shown, 16 images]
[im 6/95  soft-tissue]
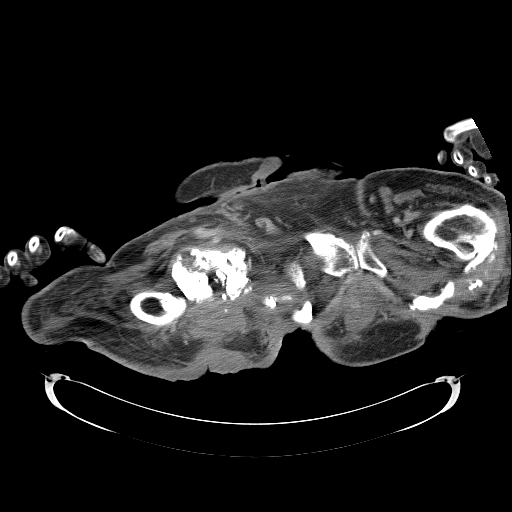
[im 6/95  bone]
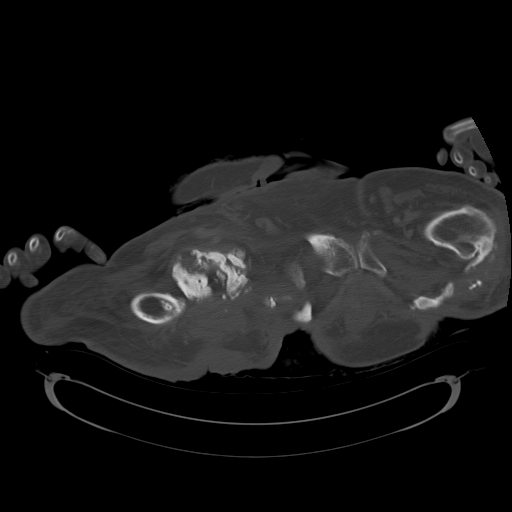
[im 11/95  soft-tissue]
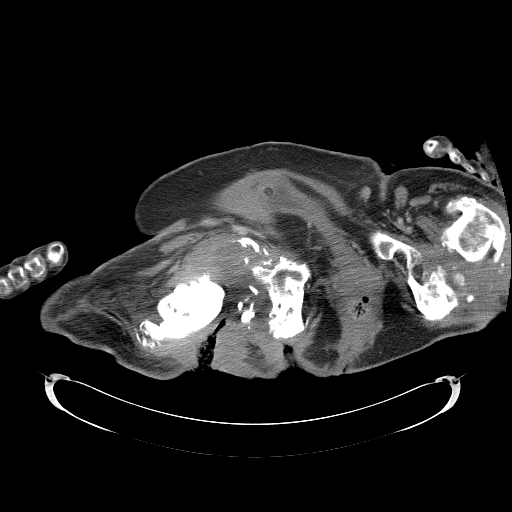
[im 21/95  soft-tissue]
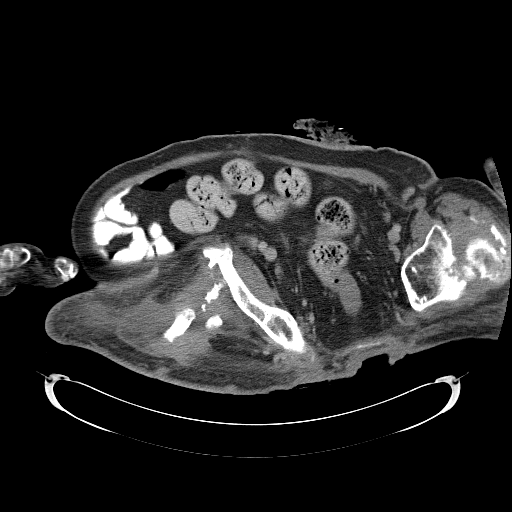
[im 27/95  soft-tissue]
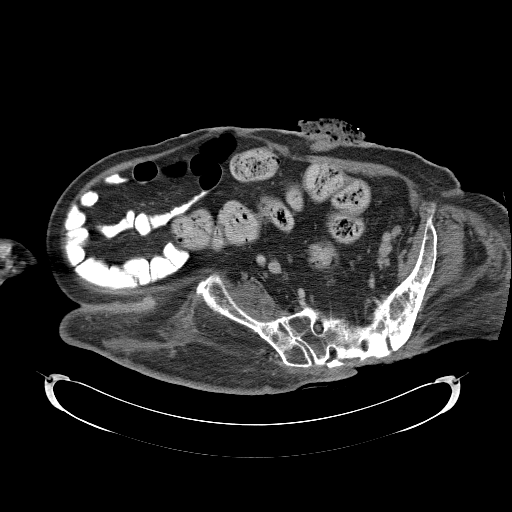
[im 32/95  soft-tissue]
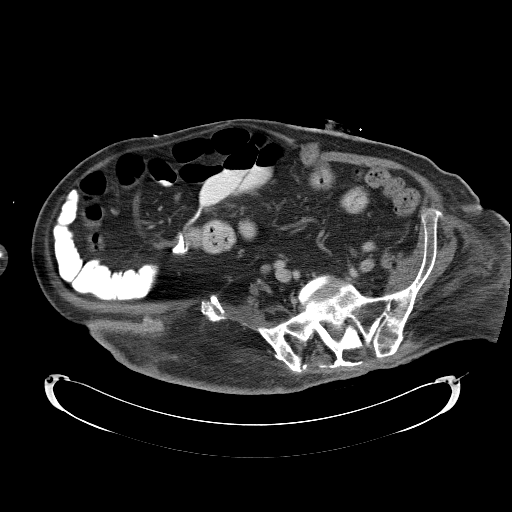
[im 37/95  soft-tissue]
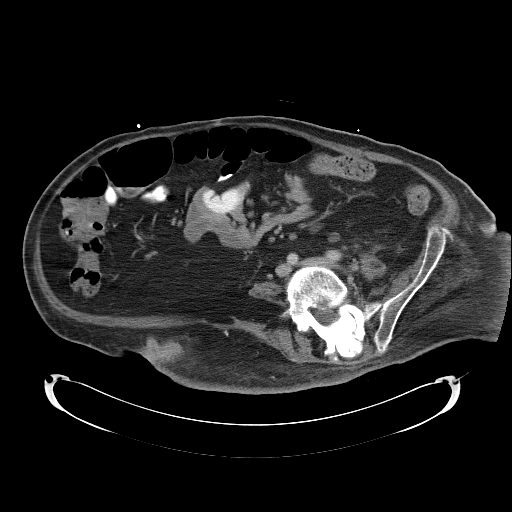
[im 42/95  soft-tissue]
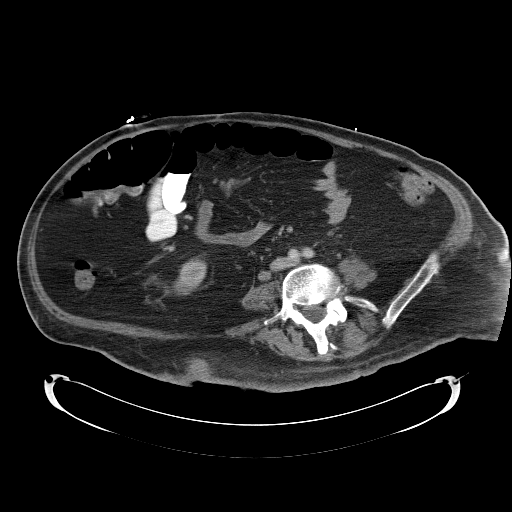
[im 53/95  soft-tissue]
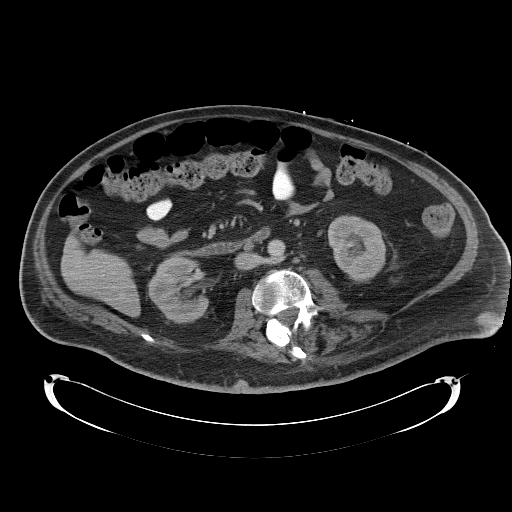
[im 58/95  soft-tissue]
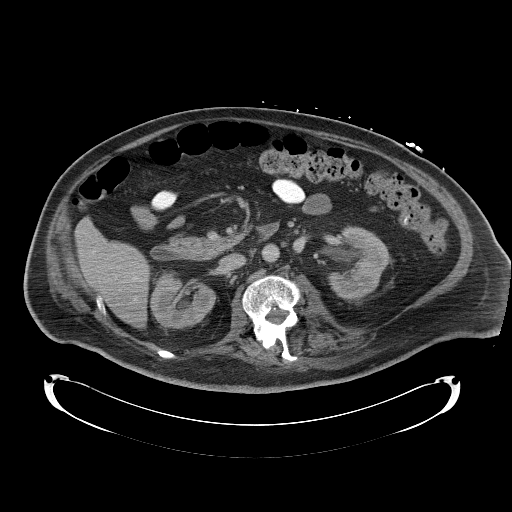
[im 58/95  bone]
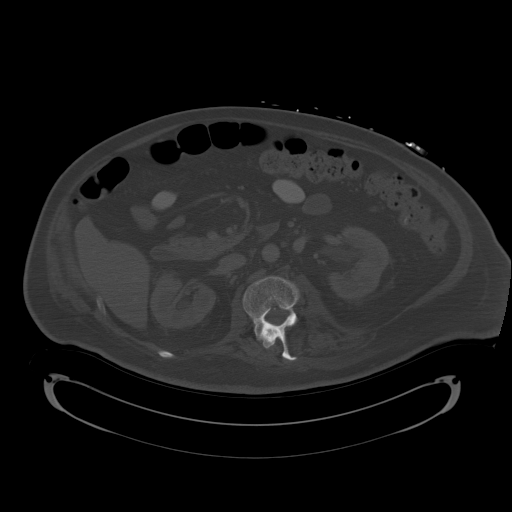
[im 63/95  soft-tissue]
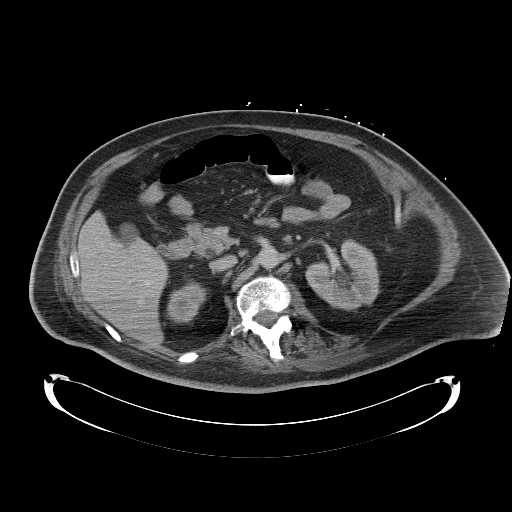
[im 68/95  soft-tissue]
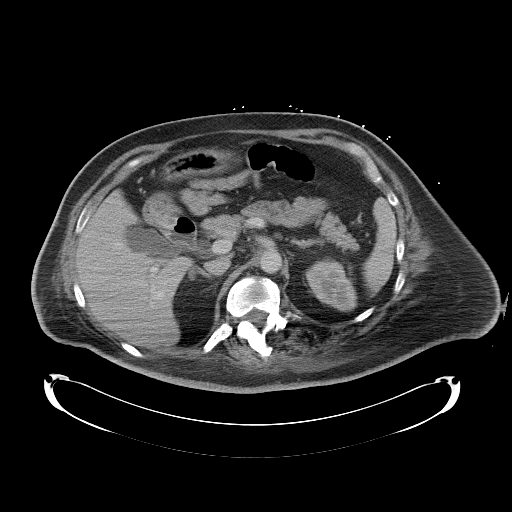
[im 74/95  soft-tissue]
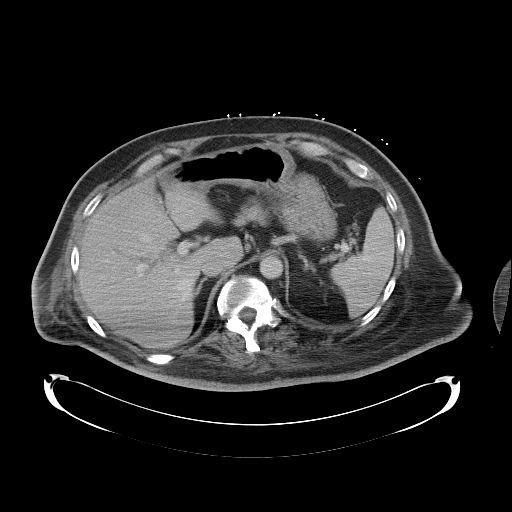
[im 84/95  soft-tissue]
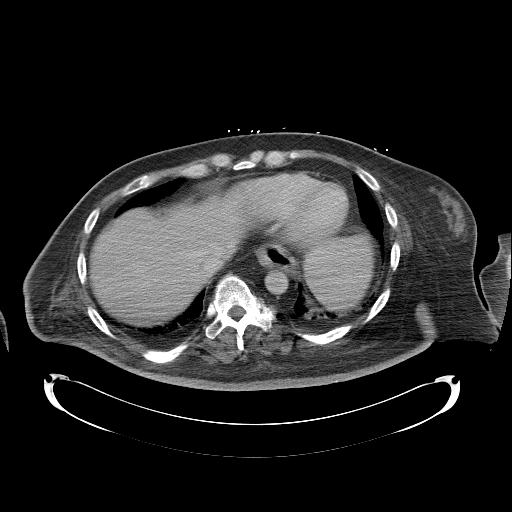
[im 89/95  soft-tissue]
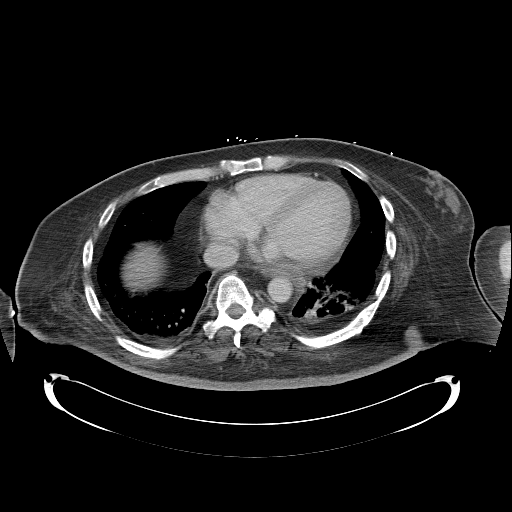

[Series 602: <mpr thick range> · coronal · 0.96mm/px · 3 of 99 slices shown]
[im 33/99  soft-tissue]
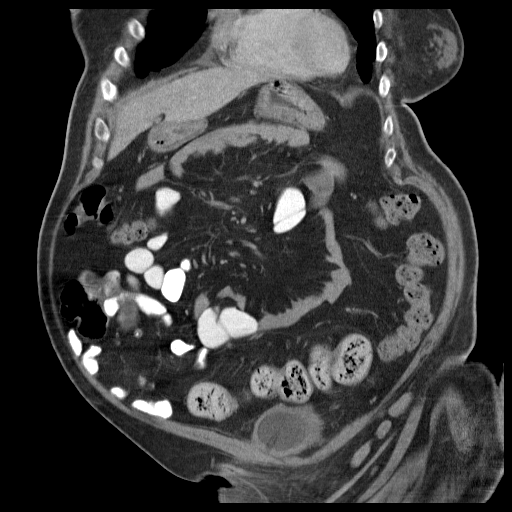
[im 44/99  soft-tissue]
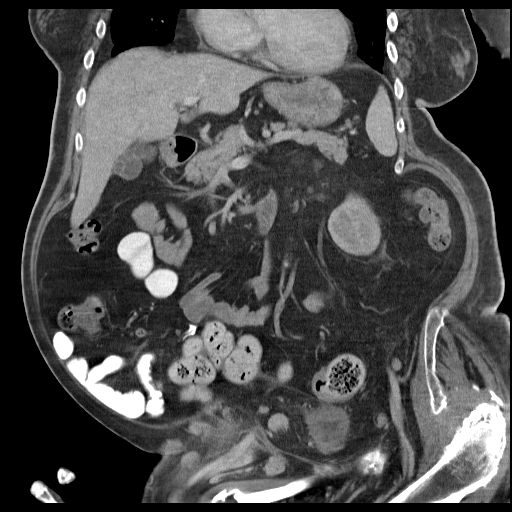
[im 55/99  soft-tissue]
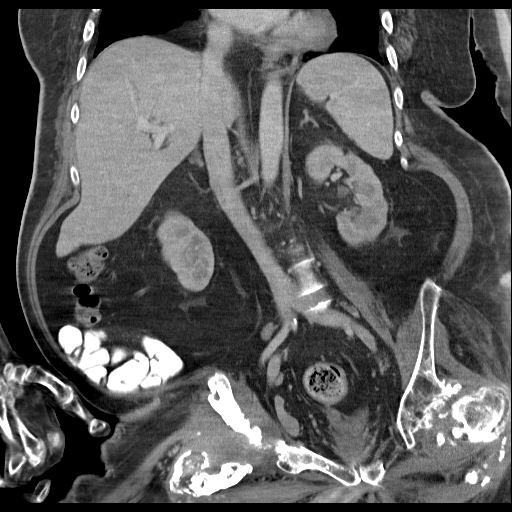

[17 of 46 positions shown; findings below may reference images not displayed]

FINDINGS: Lower chest: Bilateral small pleural effusions are noted with
associated compressive atelectasis. Heart size upper limits of
normal. Trace pericardial fluid.

Hepatobiliary: Liver and gallbladder are grossly unremarkable.

Pancreas: Normal

Spleen: Normal

Adrenals/Urinary Tract: Normal appearance to the adrenal glands and
kidneys bilaterally. A suprapubic Foley catheter is in place and the
bladder is decompressed. No radiopaque renal or ureteral calculus.

Stomach/Bowel: Normal appendix. Rectal wall thickness at upper
limits of normal with overlying decubitus ulcer reidentified. No
bowel dilatation. Interval resolution of previously seen gaseous
bowel and stomach distension.

Vascular/Lymphatic: Mild atheromatous aortic calcification without
aneurysm. Small retroperitoneal nodes are incidentally noted
measuring 7 mm and smaller, without lymphadenopathy. Bilateral,
right greater than left, inguinal lymph nodes with subjective mild
cortical prominence noted but normal size overall.

Other: Left lower quadrant ostomy.  No free air or fluid.

Musculoskeletal: Gross osseous deformity of the proximal femurs and
pelvis reidentified with large overlying soft tissue defects and
subcutaneous gas compatible with ulceration and underlying
osteomyelitis, chronic.
IMPRESSION: No evidence for intra-abdominal or pelvic infectious process.

## 2017-07-22 MED ORDER — HEPARIN SOD (PORK) LOCK FLUSH 100 UNIT/ML IV SOLN
500.0000 [IU] | INTRAVENOUS | Status: AC | PRN
  Administered 2017-07-22: 500 [IU]

## 2017-07-22 MED ORDER — POTASSIUM CHLORIDE CRYS ER 20 MEQ PO TBCR: 20.0000 meq | EXTENDED_RELEASE_TABLET | ORAL | 0 refills | Status: DC

## 2017-07-22 MED ORDER — SACCHAROMYCES BOULARDII 250 MG PO CAPS: 250.0000 mg | ORAL_CAPSULE | Freq: Two times a day (BID) | ORAL | 0 refills | Status: AC

## 2017-07-22 NOTE — Progress Notes (Signed)
Patient has been discharged. Case manager contacted to assist to see id home health needs will be resumed.

## 2017-07-22 NOTE — Progress Notes (Signed)
[facility]   Reason for visit: Follow up on UTI  Interval History: afebrile > 48 hours, Proteus on culture; WBC last wnl.  Some abdominal bloating but no other complaints today.  No chills.  No associated rash.    Physical Exam: Constitutional:  Vitals:   07/21/17 1335 07/22/17 0307  BP: (!) 147/109 (!) 152/99  Pulse: 84 86  Resp: 18   Temp: 98.7 F (37.1 C) 97.8 F (36.6 C)  SpO2: 100% 100%   patient appears in NAD, alert Eyes: anicteric HENT: no thrush Respiratory: Normal respiratory effort; CTA B Cardiovascular: RRR GI: soft, nt, nd  Review of Systems: Constitutional: negative for fevers, chills and anorexia Gastrointestinal: negative for diarrhea and one stool this am, somewhat loose but not watery Integument/breast: negative for rash  Lab Results  Component Value Date   WBC 7.0 07/20/2017   HGB 7.5 (L) 07/20/2017   HCT 25.2 (L) 07/20/2017   MCV 80.8 07/20/2017   PLT 275 07/20/2017    Lab Results  Component Value Date   CREATININE 0.61 07/19/2017   BUN 22 (H) 07/19/2017   NA 138 07/19/2017   K 3.8 07/19/2017   CL 109 07/19/2017   CO2 22 07/19/2017    Lab Results  Component Value Date   ALT 26 07/18/2017   AST 25 07/18/2017   ALKPHOS 82 07/18/2017     Microbiology: Recent Results (from the past 240 hour(s))  Culture, blood (routine x 2)     Status: None (Preliminary result)   Collection Time: 07/18/17 10:16 PM  Result Value Ref Range Status   Specimen Description   Final    BLOOD RIGHT CHEST Performed at [facility], [address], [place], [place] [address]    Special Requests   Final    BOTTLES DRAWN AEROBIC AND ANAEROBIC Blood Culture adequate volume Performed at [facility] [address] [place], [place] [address]    Culture   Final    NO GROWTH 4 DAYS Performed at [facility], [place] [address], [place], [place] [address]    Report Status PENDING  Incomplete    Urine culture     Status: Abnormal   Collection Time: 07/18/17 11:38 PM  Result Value Ref Range Status   Specimen Description   Final    URINE, CATHETERIZED Performed at [facility] [address] [place], [place] [address]    Special Requests   Final    NONE Performed at [facility], [place] [address], [place], [place] [address]    Culture >=100,000 COLONIES/mL PROTEUS MIRABILIS (A)  Final   Report Status 07/21/2017 FINAL  Final   Organism ID, Bacteria PROTEUS MIRABILIS (A)  Final      Susceptibility   Proteus mirabilis - MIC*    AMPICILLIN >=32 RESISTANT Resistant     CEFAZOLIN 16 SENSITIVE Sensitive     CEFTRIAXONE <=1 SENSITIVE Sensitive     CIPROFLOXACIN >=4 RESISTANT Resistant     GENTAMICIN 8 INTERMEDIATE Intermediate     IMIPENEM 4 SENSITIVE Sensitive     NITROFURANTOIN 128 RESISTANT Resistant     TRIMETH/SULFA >=320 RESISTANT Resistant     AMPICILLIN/SULBACTAM >=32 RESISTANT Resistant     PIP/TAZO <=4 SENSITIVE Sensitive     * >=100,000 COLONIES/mL PROTEUS MIRABILIS  C difficile quick scan w PCR reflex     Status: None   Collection Time: 07/19/17  9:57 AM  Result Value Ref  Range Status   C Diff antigen NEGATIVE NEGATIVE Final   C Diff toxin NEGATIVE NEGATIVE Final   C Diff interpretation No C. difficile detected.  Final    Comment: Performed at [facility], [name] [address] [place], [place] [address]  MRSA PCR Screening     Status: Abnormal   Collection Time: 07/19/17  5:20 PM  Result Value Ref Range Status   MRSA by PCR POSITIVE (A) NEGATIVE Final    Comment:        The GeneXpert MRSA Assay (FDA approved for NASAL specimens only), is one component of a comprehensive MRSA colonization surveillance program. It is not intended to diagnose MRSA infection nor to guide or monitor treatment for MRSA infections. RESULT CALLED TO, READ BACK BY AND VERIFIED WITH: Bing Quarry [id number] @ [place] Performed at [facility] [address], [place], [place] [address]   Gastrointestinal Panel by PCR , Stool     Status: None   Collection Time: 07/20/17  6:37 AM  Result Value Ref Range Status   Campylobacter species NOT DETECTED NOT DETECTED Final   Plesimonas shigelloides NOT DETECTED NOT DETECTED Final   Salmonella species NOT DETECTED NOT DETECTED Final   Yersinia enterocolitica NOT DETECTED NOT DETECTED Final   Vibrio species NOT DETECTED NOT DETECTED Final   Vibrio cholerae NOT DETECTED NOT DETECTED Final   Enteroaggregative E coli (EAEC) NOT DETECTED NOT DETECTED Final   Enteropathogenic E coli (EPEC) NOT DETECTED NOT DETECTED Final   Enterotoxigenic E coli (ETEC) NOT DETECTED NOT DETECTED Final   Shiga like toxin producing E coli (STEC) NOT DETECTED NOT DETECTED Final   Shigella/Enteroinvasive E coli (EIEC) NOT DETECTED NOT DETECTED Final   Cryptosporidium NOT DETECTED NOT DETECTED Final   Cyclospora cayetanensis NOT DETECTED NOT DETECTED Final   Entamoeba histolytica NOT DETECTED NOT DETECTED Final   Giardia lamblia NOT DETECTED NOT DETECTED Final   Adenovirus F40/41 NOT DETECTED NOT DETECTED Final   Astrovirus NOT DETECTED NOT DETECTED Final   Norovirus GI/GII NOT DETECTED NOT DETECTED Final   Rotavirus A NOT DETECTED NOT DETECTED Final   Sapovirus (I, II, IV, and V) NOT DETECTED NOT DETECTED Final    Comment: Performed at [facility], [address], [place], [place] [address]    Impression/Plan:  1. Fever - no other source.  Decubitus ulcer did not look infected, nothing significant on bone scan, Proteus on urine in the setting of suprapubic catheter.  Now resolved.   OK from ID standpoint for discharge.    2.  Abdominal bloating - may need bowel regimen.  Discussed with Dr. Erlinda Hong.  3.  Thyroid nodule - biopsied and path pending.  Will need follow up with his primary doctor.
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
## 2017-07-22 NOTE — Progress Notes (Signed)
Pt has been discharged. Dressings changed. Ambulance transport arranged.

## 2017-07-23 LAB — CULTURE, BLOOD (ROUTINE X 2)
Culture: NO GROWTH
Special Requests: ADEQUATE

## 2017-07-25 IMAGING — DX DG ABD PORTABLE 1V
2 series · 2 of 2 positions shown · non-contrast
Comparison: Comparison is an acute abdomen series dated 10/08/2014.

CLINICAL DATA: Constipation, quadriplegia.

EXAM:
PORTABLE ABDOMEN - 1 VIEW

[abdomen kub (1 of 2)]
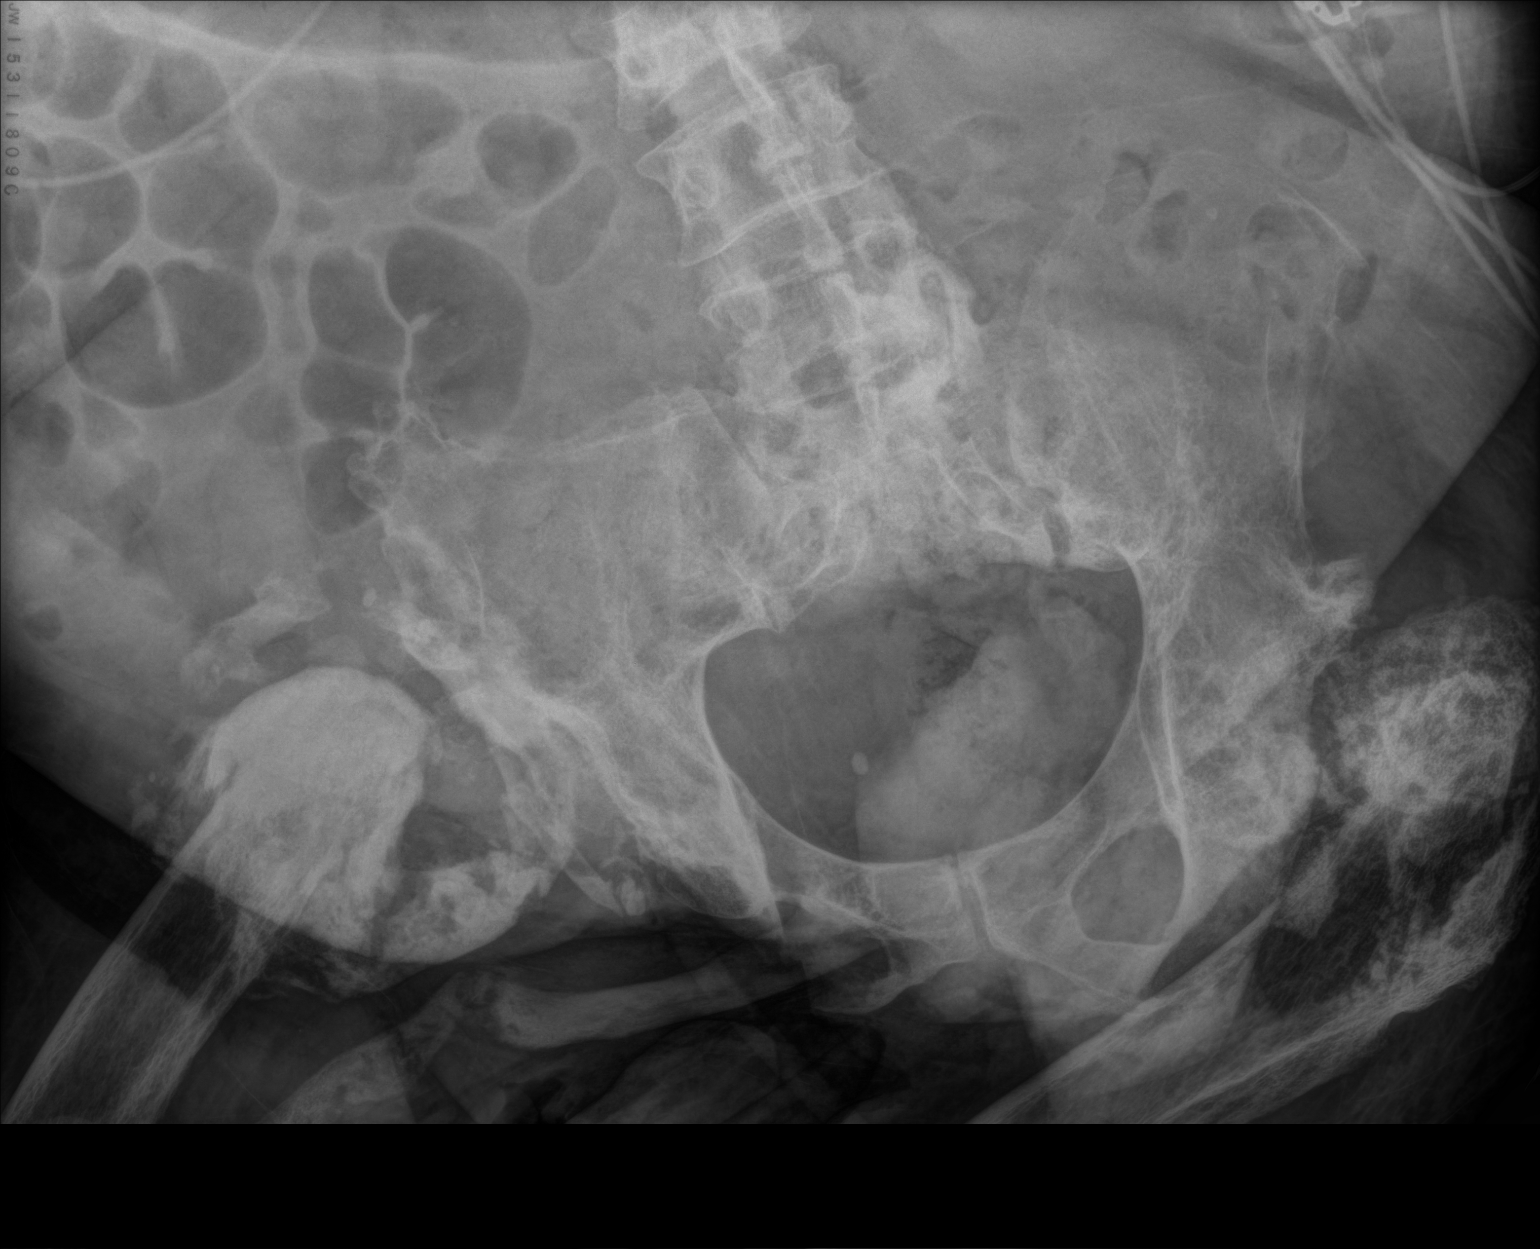

[abdomen kub (2 of 2)]
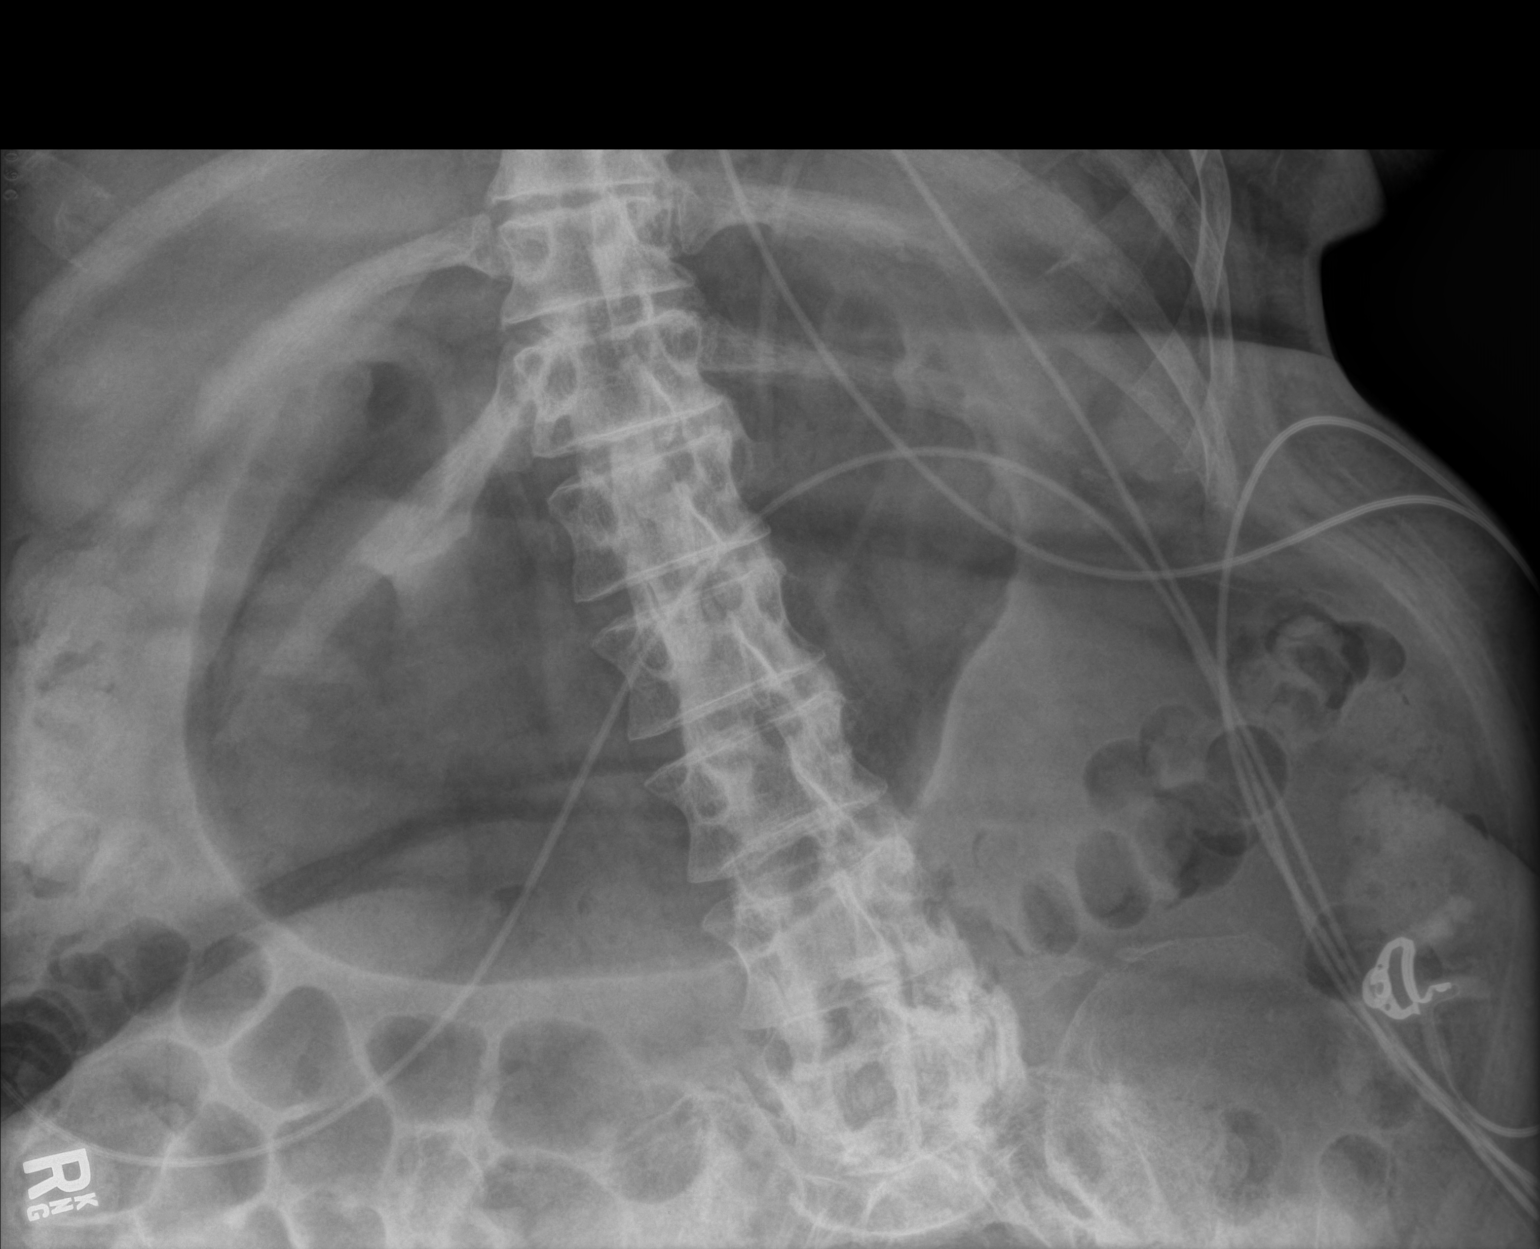

[2 of 2 positions shown; findings below may reference images not displayed]

FINDINGS: Overall bowel gas pattern is nonobstructive with no dilated small or
large bowel loops identified. There is moderate gaseous distention
of the stomach but this is less distention than what was seen on the
previous exam. There is no evidence of soft tissue mass or abnormal
fluid collection within the abdomen or pelvis. No evidence of free
intraperitoneal air.

Again noted are the severe degenerative/dystrophic changes at each
hip joint. No acute osseous abnormality identified.
IMPRESSION: No evidence of acute intra-abdominal or intrapelvic abnormality
identified. No evidence of small or large bowel obstruction. There
is moderate gaseous distention of the stomach but this is improved
compared to the previous exam.

## 2017-07-31 DIAGNOSIS — R197 Diarrhea, unspecified: Secondary | ICD-10-CM | POA: Diagnosis not present

## 2017-07-31 DIAGNOSIS — E041 Nontoxic single thyroid nodule: Secondary | ICD-10-CM | POA: Diagnosis not present

## 2017-07-31 DIAGNOSIS — Z09 Encounter for follow-up examination after completed treatment for conditions other than malignant neoplasm: Secondary | ICD-10-CM | POA: Diagnosis not present

## 2017-07-31 DIAGNOSIS — A419 Sepsis, unspecified organism: Secondary | ICD-10-CM | POA: Diagnosis not present

## 2017-08-04 DIAGNOSIS — Z466 Encounter for fitting and adjustment of urinary device: Secondary | ICD-10-CM | POA: Diagnosis not present

## 2017-08-04 DIAGNOSIS — G8254 Quadriplegia, C5-C7 incomplete: Secondary | ICD-10-CM | POA: Diagnosis not present

## 2017-08-04 DIAGNOSIS — E662 Morbid (severe) obesity with alveolar hypoventilation: Secondary | ICD-10-CM | POA: Diagnosis not present

## 2017-08-04 DIAGNOSIS — I739 Peripheral vascular disease, unspecified: Secondary | ICD-10-CM | POA: Diagnosis not present

## 2017-08-04 DIAGNOSIS — D638 Anemia in other chronic diseases classified elsewhere: Secondary | ICD-10-CM | POA: Diagnosis not present

## 2017-08-04 DIAGNOSIS — I10 Essential (primary) hypertension: Secondary | ICD-10-CM | POA: Diagnosis not present

## 2017-08-04 DIAGNOSIS — Z933 Colostomy status: Secondary | ICD-10-CM | POA: Diagnosis not present

## 2017-08-04 DIAGNOSIS — K219 Gastro-esophageal reflux disease without esophagitis: Secondary | ICD-10-CM | POA: Diagnosis not present

## 2017-08-04 DIAGNOSIS — Z435 Encounter for attention to cystostomy: Secondary | ICD-10-CM | POA: Diagnosis not present

## 2017-08-04 DIAGNOSIS — L89214 Pressure ulcer of right hip, stage 4: Secondary | ICD-10-CM | POA: Diagnosis not present

## 2017-08-04 DIAGNOSIS — L89154 Pressure ulcer of sacral region, stage 4: Secondary | ICD-10-CM | POA: Diagnosis not present

## 2017-08-04 DIAGNOSIS — F329 Major depressive disorder, single episode, unspecified: Secondary | ICD-10-CM | POA: Diagnosis not present

## 2017-08-04 DIAGNOSIS — L89153 Pressure ulcer of sacral region, stage 3: Secondary | ICD-10-CM | POA: Diagnosis not present

## 2017-08-04 DIAGNOSIS — G4733 Obstructive sleep apnea (adult) (pediatric): Secondary | ICD-10-CM | POA: Diagnosis not present

## 2017-08-04 DIAGNOSIS — L89114 Pressure ulcer of right upper back, stage 4: Secondary | ICD-10-CM | POA: Diagnosis not present

## 2017-08-07 ENCOUNTER — Encounter (HOSPITAL_BASED_OUTPATIENT_CLINIC_OR_DEPARTMENT_OTHER): Payer: Medicare Other | Attending: Internal Medicine

## 2017-08-07 DIAGNOSIS — G4730 Sleep apnea, unspecified: Secondary | ICD-10-CM | POA: Insufficient documentation

## 2017-08-07 DIAGNOSIS — G8250 Quadriplegia, unspecified: Secondary | ICD-10-CM | POA: Diagnosis not present

## 2017-08-07 DIAGNOSIS — I10 Essential (primary) hypertension: Secondary | ICD-10-CM | POA: Diagnosis not present

## 2017-08-07 DIAGNOSIS — L89892 Pressure ulcer of other site, stage 2: Secondary | ICD-10-CM | POA: Insufficient documentation

## 2017-08-07 DIAGNOSIS — S61200A Unspecified open wound of right index finger without damage to nail, initial encounter: Secondary | ICD-10-CM | POA: Diagnosis not present

## 2017-08-07 DIAGNOSIS — L97512 Non-pressure chronic ulcer of other part of right foot with fat layer exposed: Secondary | ICD-10-CM | POA: Insufficient documentation

## 2017-08-07 DIAGNOSIS — L89894 Pressure ulcer of other site, stage 4: Secondary | ICD-10-CM | POA: Diagnosis not present

## 2017-08-07 DIAGNOSIS — S31109A Unspecified open wound of abdominal wall, unspecified quadrant without penetration into peritoneal cavity, initial encounter: Secondary | ICD-10-CM | POA: Diagnosis not present

## 2017-08-07 DIAGNOSIS — S91301A Unspecified open wound, right foot, initial encounter: Secondary | ICD-10-CM | POA: Diagnosis not present

## 2017-08-07 DIAGNOSIS — S61202A Unspecified open wound of right middle finger without damage to nail, initial encounter: Secondary | ICD-10-CM | POA: Diagnosis not present

## 2017-08-07 DIAGNOSIS — L89612 Pressure ulcer of right heel, stage 2: Secondary | ICD-10-CM | POA: Insufficient documentation

## 2017-08-07 DIAGNOSIS — Z933 Colostomy status: Secondary | ICD-10-CM | POA: Diagnosis not present

## 2017-08-07 DIAGNOSIS — L89154 Pressure ulcer of sacral region, stage 4: Secondary | ICD-10-CM | POA: Insufficient documentation

## 2017-08-07 DIAGNOSIS — S31000A Unspecified open wound of lower back and pelvis without penetration into retroperitoneum, initial encounter: Secondary | ICD-10-CM | POA: Diagnosis not present

## 2017-08-07 DIAGNOSIS — A0471 Enterocolitis due to Clostridium difficile, recurrent: Secondary | ICD-10-CM | POA: Diagnosis not present

## 2017-08-09 DIAGNOSIS — L89153 Pressure ulcer of sacral region, stage 3: Secondary | ICD-10-CM | POA: Diagnosis not present

## 2017-08-09 DIAGNOSIS — Z435 Encounter for attention to cystostomy: Secondary | ICD-10-CM | POA: Diagnosis not present

## 2017-08-09 DIAGNOSIS — L89114 Pressure ulcer of right upper back, stage 4: Secondary | ICD-10-CM | POA: Diagnosis not present

## 2017-08-09 DIAGNOSIS — L89214 Pressure ulcer of right hip, stage 4: Secondary | ICD-10-CM | POA: Diagnosis not present

## 2017-08-09 DIAGNOSIS — L89154 Pressure ulcer of sacral region, stage 4: Secondary | ICD-10-CM | POA: Diagnosis not present

## 2017-08-09 DIAGNOSIS — G8254 Quadriplegia, C5-C7 incomplete: Secondary | ICD-10-CM | POA: Diagnosis not present

## 2017-08-11 DIAGNOSIS — L89214 Pressure ulcer of right hip, stage 4: Secondary | ICD-10-CM | POA: Diagnosis not present

## 2017-08-11 DIAGNOSIS — G8254 Quadriplegia, C5-C7 incomplete: Secondary | ICD-10-CM | POA: Diagnosis not present

## 2017-08-11 DIAGNOSIS — L89153 Pressure ulcer of sacral region, stage 3: Secondary | ICD-10-CM | POA: Diagnosis not present

## 2017-08-11 DIAGNOSIS — L89154 Pressure ulcer of sacral region, stage 4: Secondary | ICD-10-CM | POA: Diagnosis not present

## 2017-08-11 DIAGNOSIS — Z435 Encounter for attention to cystostomy: Secondary | ICD-10-CM | POA: Diagnosis not present

## 2017-08-11 DIAGNOSIS — L89114 Pressure ulcer of right upper back, stage 4: Secondary | ICD-10-CM | POA: Diagnosis not present

## 2017-08-17 DIAGNOSIS — A419 Sepsis, unspecified organism: Secondary | ICD-10-CM | POA: Diagnosis not present

## 2017-08-17 DIAGNOSIS — L89159 Pressure ulcer of sacral region, unspecified stage: Secondary | ICD-10-CM | POA: Diagnosis not present

## 2017-08-17 DIAGNOSIS — G8250 Quadriplegia, unspecified: Secondary | ICD-10-CM | POA: Diagnosis not present

## 2017-08-17 DIAGNOSIS — L89119 Pressure ulcer of right upper back, unspecified stage: Secondary | ICD-10-CM | POA: Diagnosis not present

## 2017-08-18 DIAGNOSIS — G4730 Sleep apnea, unspecified: Secondary | ICD-10-CM | POA: Diagnosis not present

## 2017-08-18 DIAGNOSIS — D649 Anemia, unspecified: Secondary | ICD-10-CM | POA: Diagnosis not present

## 2017-08-18 DIAGNOSIS — Z0000 Encounter for general adult medical examination without abnormal findings: Secondary | ICD-10-CM | POA: Diagnosis not present

## 2017-08-18 DIAGNOSIS — Z993 Dependence on wheelchair: Secondary | ICD-10-CM | POA: Diagnosis not present

## 2017-08-29 ENCOUNTER — Encounter (HOSPITAL_BASED_OUTPATIENT_CLINIC_OR_DEPARTMENT_OTHER): Payer: Medicare Other

## 2017-09-01 DIAGNOSIS — L89159 Pressure ulcer of sacral region, unspecified stage: Secondary | ICD-10-CM | POA: Diagnosis not present

## 2017-09-01 DIAGNOSIS — L89119 Pressure ulcer of right upper back, unspecified stage: Secondary | ICD-10-CM | POA: Diagnosis not present

## 2017-09-01 DIAGNOSIS — G8250 Quadriplegia, unspecified: Secondary | ICD-10-CM | POA: Diagnosis not present

## 2017-09-01 DIAGNOSIS — L89154 Pressure ulcer of sacral region, stage 4: Secondary | ICD-10-CM | POA: Diagnosis not present

## 2017-09-06 ENCOUNTER — Inpatient Hospital Stay (HOSPITAL_COMMUNITY)
Admission: EM | Admit: 2017-09-06 | Discharge: 2017-09-09 | DRG: 698 | Disposition: A | Discharge status: Home Health | Payer: Medicare Other | Attending: Internal Medicine | Admitting: Internal Medicine

## 2017-09-06 ENCOUNTER — Other Ambulatory Visit: Payer: Self-pay

## 2017-09-06 ENCOUNTER — Emergency Department (HOSPITAL_COMMUNITY): Payer: Medicare Other

## 2017-09-06 ENCOUNTER — Encounter (HOSPITAL_COMMUNITY): Payer: Self-pay

## 2017-09-06 DIAGNOSIS — E46 Unspecified protein-calorie malnutrition: Secondary | ICD-10-CM | POA: Diagnosis not present

## 2017-09-06 DIAGNOSIS — K219 Gastro-esophageal reflux disease without esophagitis: Secondary | ICD-10-CM | POA: Diagnosis present

## 2017-09-06 DIAGNOSIS — Z79899 Other long term (current) drug therapy: Secondary | ICD-10-CM | POA: Diagnosis not present

## 2017-09-06 DIAGNOSIS — S14155S Other incomplete lesion at C5 level of cervical spinal cord, sequela: Secondary | ICD-10-CM | POA: Diagnosis not present

## 2017-09-06 DIAGNOSIS — Z888 Allergy status to other drugs, medicaments and biological substances status: Secondary | ICD-10-CM | POA: Diagnosis not present

## 2017-09-06 DIAGNOSIS — L89154 Pressure ulcer of sacral region, stage 4: Secondary | ICD-10-CM | POA: Diagnosis present

## 2017-09-06 DIAGNOSIS — R4182 Altered mental status, unspecified: Secondary | ICD-10-CM | POA: Diagnosis not present

## 2017-09-06 DIAGNOSIS — Z8711 Personal history of peptic ulcer disease: Secondary | ICD-10-CM

## 2017-09-06 DIAGNOSIS — T83510A Infection and inflammatory reaction due to cystostomy catheter, initial encounter: Principal | ICD-10-CM | POA: Diagnosis present

## 2017-09-06 DIAGNOSIS — E876 Hypokalemia: Secondary | ICD-10-CM | POA: Diagnosis not present

## 2017-09-06 DIAGNOSIS — I10 Essential (primary) hypertension: Secondary | ICD-10-CM | POA: Diagnosis present

## 2017-09-06 DIAGNOSIS — R6510 Systemic inflammatory response syndrome (SIRS) of non-infectious origin without acute organ dysfunction: Secondary | ICD-10-CM | POA: Diagnosis present

## 2017-09-06 DIAGNOSIS — Z8701 Personal history of pneumonia (recurrent): Secondary | ICD-10-CM | POA: Diagnosis not present

## 2017-09-06 DIAGNOSIS — Z87440 Personal history of urinary (tract) infections: Secondary | ICD-10-CM

## 2017-09-06 DIAGNOSIS — I959 Hypotension, unspecified: Secondary | ICD-10-CM | POA: Diagnosis present

## 2017-09-06 DIAGNOSIS — Z881 Allergy status to other antibiotic agents status: Secondary | ICD-10-CM | POA: Diagnosis not present

## 2017-09-06 DIAGNOSIS — G4733 Obstructive sleep apnea (adult) (pediatric): Secondary | ICD-10-CM | POA: Diagnosis not present

## 2017-09-06 DIAGNOSIS — G9341 Metabolic encephalopathy: Secondary | ICD-10-CM | POA: Diagnosis not present

## 2017-09-06 DIAGNOSIS — R531 Weakness: Secondary | ICD-10-CM | POA: Diagnosis not present

## 2017-09-06 DIAGNOSIS — Z9359 Other cystostomy status: Secondary | ICD-10-CM | POA: Diagnosis not present

## 2017-09-06 DIAGNOSIS — Z803 Family history of malignant neoplasm of breast: Secondary | ICD-10-CM | POA: Diagnosis not present

## 2017-09-06 DIAGNOSIS — G8250 Quadriplegia, unspecified: Secondary | ICD-10-CM | POA: Diagnosis not present

## 2017-09-06 DIAGNOSIS — N390 Urinary tract infection, site not specified: Secondary | ICD-10-CM | POA: Diagnosis not present

## 2017-09-06 DIAGNOSIS — Z6833 Body mass index (BMI) 33.0-33.9, adult: Secondary | ICD-10-CM | POA: Diagnosis not present

## 2017-09-06 DIAGNOSIS — Z933 Colostomy status: Secondary | ICD-10-CM

## 2017-09-06 DIAGNOSIS — I6789 Other cerebrovascular disease: Secondary | ICD-10-CM | POA: Diagnosis not present

## 2017-09-06 DIAGNOSIS — R509 Fever, unspecified: Secondary | ICD-10-CM | POA: Diagnosis not present

## 2017-09-06 DIAGNOSIS — I9589 Other hypotension: Secondary | ICD-10-CM | POA: Diagnosis not present

## 2017-09-06 DIAGNOSIS — J9690 Respiratory failure, unspecified, unspecified whether with hypoxia or hypercapnia: Secondary | ICD-10-CM | POA: Diagnosis not present

## 2017-09-06 LAB — URINALYSIS, ROUTINE W REFLEX MICROSCOPIC
Bilirubin Urine: NEGATIVE
Glucose, UA: NEGATIVE mg/dL
Hgb urine dipstick: NEGATIVE
Ketones, ur: NEGATIVE mg/dL
Nitrite: NEGATIVE
SPECIFIC GRAVITY, URINE: 1.011 (ref 1.005–1.030)
pH: 9 — ABNORMAL HIGH (ref 5.0–8.0)

## 2017-09-06 LAB — COMPREHENSIVE METABOLIC PANEL
ALBUMIN: 2.7 g/dL — AB (ref 3.5–5.0)
ALT: 19 U/L (ref 17–63)
ANION GAP: 9 (ref 5–15)
AST: 16 U/L (ref 15–41)
Alkaline Phosphatase: 65 U/L (ref 38–126)
BUN: 39 mg/dL — AB (ref 6–20)
CHLORIDE: 104 mmol/L (ref 101–111)
CO2: 25 mmol/L (ref 22–32)
Calcium: 7.9 mg/dL — ABNORMAL LOW (ref 8.9–10.3)
Creatinine, Ser: 1.27 mg/dL — ABNORMAL HIGH (ref 0.61–1.24)
GFR calc Af Amer: >60 mL/min (ref >60)
GFR calc non Af Amer: >60 mL/min (ref >60)
GLUCOSE: 100 mg/dL — AB (ref 65–99)
POTASSIUM: 2.9 mmol/L — AB (ref 3.5–5.1)
SODIUM: 138 mmol/L (ref 135–145)
Total Bilirubin: 0.5 mg/dL (ref 0.3–1.2)
Total Protein: 7.4 g/dL (ref 6.5–8.1)

## 2017-09-06 LAB — CBC WITH DIFFERENTIAL/PLATELET
BASOS ABS: 0 10*3/uL (ref 0.0–0.1)
Basophils Relative: 0 %
Eosinophils Absolute: 0.3 10*3/uL (ref 0.0–0.7)
Eosinophils Relative: 3 %
HEMATOCRIT: 29.1 % — AB (ref 39.0–52.0)
Hemoglobin: 8.9 g/dL — ABNORMAL LOW (ref 13.0–17.0)
LYMPHS ABS: 1.9 10*3/uL (ref 0.7–4.0)
LYMPHS PCT: 17 %
MCH: 23.9 pg — AB (ref 26.0–34.0)
MCHC: 30.6 g/dL (ref 30.0–36.0)
MCV: 78.2 fL (ref 78.0–100.0)
MONO ABS: 1 10*3/uL (ref 0.1–1.0)
Monocytes Relative: 9 %
NEUTROS ABS: 7.9 10*3/uL — AB (ref 1.7–7.7)
Neutrophils Relative %: 71 %
PLATELETS: 331 10*3/uL (ref 150–400)
RBC: 3.72 MIL/uL — ABNORMAL LOW (ref 4.22–5.81)
RDW: 15.6 % — AB (ref 11.5–15.5)
WBC: 11.1 10*3/uL — ABNORMAL HIGH (ref 4.0–10.5)

## 2017-09-06 LAB — MRSA PCR SCREENING: MRSA BY PCR: POSITIVE — AB

## 2017-09-06 LAB — I-STAT CG4 LACTIC ACID, ED: LACTIC ACID, VENOUS: 0.63 mmol/L (ref 0.5–1.9)

## 2017-09-06 MED ORDER — ENOXAPARIN SODIUM 40 MG/0.4ML ~~LOC~~ SOLN
40.0000 mg | SUBCUTANEOUS | Status: DC
  Administered 2017-09-06 – 2017-09-08 (×3): 40 mg via SUBCUTANEOUS
  Filled 2017-09-06 (×3): qty 0.4

## 2017-09-06 MED ORDER — ADULT MULTIVITAMIN W/MINERALS CH
1.0000 | ORAL_TABLET | ORAL | Status: DC
  Administered 2017-09-06 – 2017-09-09 (×4): 1 via ORAL
  Filled 2017-09-06 (×4): qty 1

## 2017-09-06 MED ORDER — POTASSIUM CHLORIDE CRYS ER 20 MEQ PO TBCR
40.0000 meq | EXTENDED_RELEASE_TABLET | Freq: Once | ORAL | Status: AC
Start: 2017-09-06 — End: 2017-09-06
  Administered 2017-09-06: 40 meq via ORAL
  Filled 2017-09-06: qty 2

## 2017-09-06 MED ORDER — PANTOPRAZOLE SODIUM 40 MG PO TBEC
40.0000 mg | DELAYED_RELEASE_TABLET | Freq: Two times a day (BID) | ORAL | Status: DC
  Administered 2017-09-06 – 2017-09-09 (×7): 40 mg via ORAL
  Filled 2017-09-06 (×7): qty 1

## 2017-09-06 MED ORDER — SODIUM CHLORIDE 0.9 % IV BOLUS
2000.0000 mL | Freq: Once | INTRAVENOUS | Status: AC
  Administered 2017-09-06: 2000 mL via INTRAVENOUS

## 2017-09-06 MED ORDER — FESOTERODINE FUMARATE ER 8 MG PO TB24
8.0000 mg | ORAL_TABLET | Freq: Every day | ORAL | Status: DC
  Administered 2017-09-06 – 2017-09-09 (×4): 8 mg via ORAL
  Filled 2017-09-06 (×4): qty 1

## 2017-09-06 MED ORDER — SODIUM CHLORIDE 0.9 % IV BOLUS
1000.0000 mL | Freq: Once | INTRAVENOUS | Status: AC
  Administered 2017-09-06: 1000 mL via INTRAVENOUS

## 2017-09-06 MED ORDER — SODIUM CHLORIDE 0.9% FLUSH
10.0000 mL | INTRAVENOUS | Status: DC | PRN
  Administered 2017-09-09: 20 mL
  Filled 2017-09-06: qty 40

## 2017-09-06 MED ORDER — SODIUM CHLORIDE 0.9 % IV SOLN
100.0000 mg | Freq: Two times a day (BID) | INTRAVENOUS | Status: DC
  Administered 2017-09-06 – 2017-09-07 (×2): 100 mg via INTRAVENOUS
  Filled 2017-09-06 (×3): qty 100

## 2017-09-06 MED ORDER — SODIUM CHLORIDE 0.9% FLUSH: 3.0000 mL | INTRAVENOUS | Status: DC | PRN

## 2017-09-06 MED ORDER — SODIUM CHLORIDE 0.9 % IV SOLN: 250.0000 mL | INTRAVENOUS | Status: DC | PRN

## 2017-09-06 MED ORDER — METOCLOPRAMIDE HCL 10 MG PO TABS
10.0000 mg | ORAL_TABLET | Freq: Three times a day (TID) | ORAL | Status: DC
  Administered 2017-09-06 – 2017-09-09 (×13): 10 mg via ORAL
  Filled 2017-09-06 (×13): qty 1

## 2017-09-06 MED ORDER — SODIUM CHLORIDE 0.9 % IV SOLN
INTRAVENOUS | Status: DC
  Administered 2017-09-06: 15:00:00 via INTRAVENOUS
  Administered 2017-09-07: 125 mL/h via INTRAVENOUS
  Administered 2017-09-08: 05:00:00 via INTRAVENOUS

## 2017-09-06 MED ORDER — SUCRALFATE 1 G PO TABS
1.0000 g | ORAL_TABLET | Freq: Four times a day (QID) | ORAL | Status: DC
  Administered 2017-09-06 – 2017-09-09 (×13): 1 g via ORAL
  Filled 2017-09-06 (×13): qty 1

## 2017-09-06 MED ORDER — SODIUM CHLORIDE 0.9% FLUSH
3.0000 mL | Freq: Two times a day (BID) | INTRAVENOUS | Status: DC
  Administered 2017-09-06 – 2017-09-08 (×4): 3 mL via INTRAVENOUS

## 2017-09-06 MED ORDER — BACLOFEN 20 MG PO TABS
20.0000 mg | ORAL_TABLET | Freq: Four times a day (QID) | ORAL | Status: DC
  Administered 2017-09-06 – 2017-09-09 (×13): 20 mg via ORAL
  Filled 2017-09-06: qty 1
  Filled 2017-09-06 (×4): qty 2
  Filled 2017-09-06: qty 1
  Filled 2017-09-06 (×2): qty 2
  Filled 2017-09-06: qty 1
  Filled 2017-09-06 (×2): qty 2
  Filled 2017-09-06: qty 1
  Filled 2017-09-06: qty 2

## 2017-09-06 MED ORDER — POTASSIUM CHLORIDE CRYS ER 20 MEQ PO TBCR: 40.0000 meq | EXTENDED_RELEASE_TABLET | Freq: Once | ORAL | Status: DC

## 2017-09-06 MED ORDER — SODIUM CHLORIDE 0.9 % IV SOLN
1.0000 g | INTRAVENOUS | Status: DC
  Administered 2017-09-06: 1 g via INTRAVENOUS
  Filled 2017-09-06: qty 1

## 2017-09-06 MED ORDER — VANCOMYCIN HCL IN DEXTROSE 1-5 GM/200ML-% IV SOLN
1000.0000 mg | Freq: Once | INTRAVENOUS | Status: AC
  Administered 2017-09-06: 1000 mg via INTRAVENOUS
  Filled 2017-09-06: qty 200

## 2017-09-06 MED ORDER — CHLORHEXIDINE GLUCONATE CLOTH 2 % EX PADS
6.0000 | MEDICATED_PAD | Freq: Every day | CUTANEOUS | Status: DC
  Administered 2017-09-06 – 2017-09-09 (×4): 6 via TOPICAL

## 2017-09-06 MED ORDER — FERROUS SULFATE 325 (65 FE) MG PO TABS
325.0000 mg | ORAL_TABLET | Freq: Three times a day (TID) | ORAL | Status: DC
  Administered 2017-09-07 – 2017-09-09 (×9): 325 mg via ORAL
  Filled 2017-09-06 (×9): qty 1

## 2017-09-06 MED ORDER — SODIUM CHLORIDE 0.9 % IV SOLN
2.0000 g | Freq: Once | INTRAVENOUS | Status: AC
  Administered 2017-09-06: 2 g via INTRAVENOUS
  Filled 2017-09-06: qty 2

## 2017-09-06 MED ORDER — JUVEN PO PACK
1.0000 | PACK | Freq: Every day | ORAL | Status: DC
  Administered 2017-09-06 – 2017-09-09 (×4): 1 via ORAL
  Filled 2017-09-06 (×4): qty 1

## 2017-09-06 MED ORDER — SODIUM CHLORIDE 0.9% FLUSH
10.0000 mL | Freq: Two times a day (BID) | INTRAVENOUS | Status: DC
  Administered 2017-09-06: 10 mL
  Administered 2017-09-07: 20 mL

## 2017-09-06 NOTE — Progress Notes (Signed)
A consult was received from an ED physician for vancomycin per pharmacy dosing.  The patient's profile has been reviewed for ht/wt/allergies/indication/available labs.   A one time order has been placed for vancomycin 1g.    Further antibiotics/pharmacy consults should be ordered by admitting physician if indicated.                       Thank you, Peggyann Juba, PharmD, BCPS 09/06/2017  11:52 AM

## 2017-09-06 NOTE — ED Notes (Signed)
Attempted to give report. Tilda Franco, RN will call back.

## 2017-09-06 NOTE — ED Triage Notes (Signed)
Patient arrived via [facility] from home. Patient called out for disorientation. Patient went to [facility] yesterday. Patient was put on cipro for possible UTI. This morning about 0730 he woke up and didn't know where he was. Last seen normal last night 11:30 pm. Along with disorientation patient has flu like symptoms. General malaise and cold chills. Patient is quadriplegic with some rang of motion with left arm and some feeling in left arm. bp- 80/50 in ems truck and 120/90 here using hall monitor. Patient has urinary catheter, port on right upper chest. Colostomy on left lower.

## 2017-09-06 NOTE — ED Notes (Signed)
Patients sister, [name]. [phone]. Please update sister.
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
## 2017-09-06 NOTE — Progress Notes (Signed)
   Pt. placed on CPAP for h/s, humidifier filled with SW, currently on room air, tolerating well, RT to monitor.

## 2017-09-06 NOTE — ED Notes (Signed)
Both cultures were collected before antibiotics were given. First set of cultures from port on right chest. Second set from right hand.

## 2017-09-07 DIAGNOSIS — Z8701 Personal history of pneumonia (recurrent): Secondary | ICD-10-CM | POA: Diagnosis not present

## 2017-09-07 DIAGNOSIS — Z8711 Personal history of peptic ulcer disease: Secondary | ICD-10-CM | POA: Diagnosis not present

## 2017-09-07 DIAGNOSIS — N390 Urinary tract infection, site not specified: Secondary | ICD-10-CM

## 2017-09-07 DIAGNOSIS — Z79899 Other long term (current) drug therapy: Secondary | ICD-10-CM | POA: Diagnosis not present

## 2017-09-07 DIAGNOSIS — G9341 Metabolic encephalopathy: Secondary | ICD-10-CM | POA: Diagnosis present

## 2017-09-07 DIAGNOSIS — S14155S Other incomplete lesion at C5 level of cervical spinal cord, sequela: Secondary | ICD-10-CM | POA: Diagnosis not present

## 2017-09-07 DIAGNOSIS — G8250 Quadriplegia, unspecified: Secondary | ICD-10-CM | POA: Diagnosis not present

## 2017-09-07 DIAGNOSIS — E876 Hypokalemia: Secondary | ICD-10-CM | POA: Diagnosis present

## 2017-09-07 DIAGNOSIS — Z9359 Other cystostomy status: Secondary | ICD-10-CM

## 2017-09-07 DIAGNOSIS — K219 Gastro-esophageal reflux disease without esophagitis: Secondary | ICD-10-CM | POA: Diagnosis present

## 2017-09-07 DIAGNOSIS — Z803 Family history of malignant neoplasm of breast: Secondary | ICD-10-CM | POA: Diagnosis not present

## 2017-09-07 DIAGNOSIS — L89154 Pressure ulcer of sacral region, stage 4: Secondary | ICD-10-CM

## 2017-09-07 DIAGNOSIS — E46 Unspecified protein-calorie malnutrition: Secondary | ICD-10-CM | POA: Diagnosis present

## 2017-09-07 DIAGNOSIS — R6510 Systemic inflammatory response syndrome (SIRS) of non-infectious origin without acute organ dysfunction: Secondary | ICD-10-CM

## 2017-09-07 DIAGNOSIS — Z881 Allergy status to other antibiotic agents status: Secondary | ICD-10-CM | POA: Diagnosis not present

## 2017-09-07 DIAGNOSIS — I10 Essential (primary) hypertension: Secondary | ICD-10-CM | POA: Diagnosis present

## 2017-09-07 DIAGNOSIS — I959 Hypotension, unspecified: Secondary | ICD-10-CM | POA: Diagnosis not present

## 2017-09-07 DIAGNOSIS — Z933 Colostomy status: Secondary | ICD-10-CM | POA: Diagnosis not present

## 2017-09-07 DIAGNOSIS — G4733 Obstructive sleep apnea (adult) (pediatric): Secondary | ICD-10-CM | POA: Diagnosis present

## 2017-09-07 DIAGNOSIS — Z6833 Body mass index (BMI) 33.0-33.9, adult: Secondary | ICD-10-CM | POA: Diagnosis not present

## 2017-09-07 DIAGNOSIS — Z888 Allergy status to other drugs, medicaments and biological substances status: Secondary | ICD-10-CM | POA: Diagnosis not present

## 2017-09-07 DIAGNOSIS — R4182 Altered mental status, unspecified: Secondary | ICD-10-CM | POA: Diagnosis present

## 2017-09-07 DIAGNOSIS — Z87440 Personal history of urinary (tract) infections: Secondary | ICD-10-CM | POA: Diagnosis not present

## 2017-09-07 DIAGNOSIS — T83510A Infection and inflammatory reaction due to cystostomy catheter, initial encounter: Secondary | ICD-10-CM | POA: Diagnosis present

## 2017-09-07 LAB — BASIC METABOLIC PANEL
Anion gap: 8 (ref 5–15)
BUN: 26 mg/dL — AB (ref 6–20)
CHLORIDE: 108 mmol/L (ref 101–111)
CO2: 23 mmol/L (ref 22–32)
Calcium: 7.9 mg/dL — ABNORMAL LOW (ref 8.9–10.3)
Creatinine, Ser: 0.52 mg/dL — ABNORMAL LOW (ref 0.61–1.24)
GFR calc Af Amer: >60 mL/min (ref >60)
GFR calc non Af Amer: >60 mL/min (ref >60)
GLUCOSE: 90 mg/dL (ref 65–99)
POTASSIUM: 3.2 mmol/L — AB (ref 3.5–5.1)
Sodium: 139 mmol/L (ref 135–145)

## 2017-09-07 LAB — URINE CULTURE

## 2017-09-07 MED ORDER — SODIUM CHLORIDE 0.9 % IV SOLN
1.0000 g | Freq: Three times a day (TID) | INTRAVENOUS | Status: DC
  Administered 2017-09-07 – 2017-09-09 (×7): 1 g via INTRAVENOUS
  Filled 2017-09-07 (×12): qty 1

## 2017-09-07 MED ORDER — POTASSIUM CHLORIDE CRYS ER 20 MEQ PO TBCR
40.0000 meq | EXTENDED_RELEASE_TABLET | ORAL | Status: AC
  Administered 2017-09-07 (×2): 40 meq via ORAL
  Filled 2017-09-07 (×2): qty 2
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
## 2017-09-07 NOTE — Consult Note (Signed)
[place] Nurse consult note:  Patient is well known to the [facility] Nursing department.Seen today for chronic wound reassessment and ostomy pouch change.  [place] Nurse ostomy consult note Stoma type/location: LLQ colostomy Stomal assessment/size: 1 and 5/8 inch round, raised, pink, moist with os at center Peristomal assessment: intact, clear Treatment options for stomal/peristomal skin: skin barrier ring Output: pasty brown stool Ostomy pouching: 2pc. 2 and 3/4 inch pouching system with skin barrier ring Education provided: None today Enrolled patient in [place] program: Yes, previously. Patient is well established with a provider and denies difficulty obtaining supplies.  [place] Nurse wound consult note Reason for Consult: Chronic nonhealing pressure injuries. Followed at outpatient [facility]. Wound type: Pressure Pressure Injury POA: Yes Measurements: Right posterior heel, Stage 3 PrI:  1.5cm x 1cm x 0.2cm with red, moist wound bed and scant serous exudate. Right anterior foot, Stage 3 PrI:  1.8cm x 1cm x 0.2cm with red, moist wound bed and scant serous exudate. Right lateral LE, full thickness:  1cm x 1.6cm x 0.2cm with 80% red, moist wound bed and 20% yellow slough in center.  Scant serous exudate. No odor. Right shoulder (Unstageable PrI):     11cm x 5cm x 1.4cm with 0.4cm undermining noted from 7-9 o'clock.   Wound bed is 60% pale pink and 40% thin, adherent yellow slough. Moderate serous to light yellow, slight odor. Right IT Stage 4 pressure Injury: 3cm x 2.4cm x 5cm. Bone is directly palpable in wound base.  Serous drainage, no visible necrotic tissue. Sacral Stage 4 Pressure injury:  14cm x 2cm x 0.1cm with pale pink wound bed, scant serous exudate. No odor. Wound bed: As described above Drainage (amount, consistency, odor) Moderate serous to light yellow at the right shoulder Periwound: All wounds with evidence of previous wound healing, contraction and scarring. Remainder of  integument is dry and intact. Dressing procedure/placement/frequency: I have implemented a similar POC to that of the outpatient [facility] in that for the sacral and IT wounds, we will use a tice daily saline dressing, to the right LE wounds we will use a silicone foam dressing and to the right shoulder area we will use an antimicrobial dressing.  The outpatient [place] alternates between hydrofera blue and a silver hydrofiber.  Since we do not carry the hydrofera blue in the acute care setting, I will implement silver hydrofiber to that wound. Patient reports that he has two pair of Prevalon pressure redistribution heel boots at ome and declines another pair this admission.  We will float heels using pillows.   Splint on left hand is removed and tissue beneath inspected for MDRPI.  Skin intact and no alterations in tissue integrity are found. Right knee with prophylactic silicone foam dressing in place.  No wound noted beneath, previous injury (full thickness) has resolved with scarring and contraction noted.  [place] nursing team will not follow, but will remain available to this patient, the nursing and medical teams.  Please re-consult if needed. Thanks, Maudie Flakes, MSN, RN, [place], Arther Abbott  Pager# [phone]

## 2017-09-07 NOTE — Progress Notes (Signed)
[facility] notified at approximately 2300 that suprapubic cath leaking at site. Will continue to monitor site. Will pass along in day shift report.

## 2017-09-07 NOTE — Plan of Care (Signed)
?  Problem: Pain Managment: ?Goal: General experience of comfort will improve ?Outcome: Progressing ?  ?Problem: Elimination: ?Goal: Will not experience complications related to bowel motility ?Outcome: Progressing ?  ?Problem: Nutrition: ?Goal: Adequate nutrition will be maintained ?Outcome: Progressing ?  ?

## 2017-09-07 NOTE — Progress Notes (Signed)
Pharmacy Antibiotic Note  [name] [name] is a 52 y.o. male admitted on 09/06/2017 with UTI.  Pharmacy has been consulted for Cefepime dosing (initially started on Doxy/Ceftriaxone, but escalated to Cefepime d/t history of Pseudomonas.  Doxy d/c)  Plan:  D/C Doxycycline per MD order  Change Ceftriaxone to Cefepime 1g IV q8h  Follow up renal fxn, culture results, and clinical course.      Temp (24hrs), Avg:97.9 F (36.6 C), Min:97.5 F (36.4 C), Max:98.3 F (36.8 C)  Recent Labs  Lab 09/06/17 1010 09/06/17 1040 09/07/17 0525  WBC 11.1*  --   --   CREATININE 1.27*  --  0.52*  LATICACIDVEN  --  0.63  --     CrCl cannot be calculated (Unknown ideal weight.).    Allergies  Allergen Reactions  . Feraheme [Ferumoxytol] Other (See Comments)    SYNCOPE  . Ditropan [Oxybutynin] Other (See Comments)    Hallucinations   . Vancomycin Other (See Comments)    ARF 05-2016 -- affects kidneys Has patient had a PCN reaction causing immediate rash, facial/tongue/throat swelling, SOB or lightheadedness with hypotension: Unk Has patient had a PCN reaction causing severe rash involving mucus membranes or skin necrosis: Unk Has patient had a PCN reaction that required hospitalization: Unk Has patient had a PCN reaction occurring within the last 10 years: Unk If all of the above answers are "NO", then may proceed with Cephalosporin use.    Antimicrobials this admission:  4/24 Vanc 1g x 1 4/24 Cefepime x 1, Resumed 4/25 >>  4/24 Doxycycline >> 4/25 4/24 Ceftriaxone >> 4/25   Dose adjustments this admission:    Microbiology results:  4/24 MRSA PCR: positive 4/24 BCx:  4/24 UCx:   4/24 UA: few bacteria, 6-10 WBC, pH 9, +Triple Phosphate crystals  Previous admission:  07/18/17 UCx = Proteus (sens to CTX) treated with Cefepime/Dapto x3 days.  Thank you for allowing pharmacy to be a part of this patient's care.  Gretta Arab PharmD, BCPS Pager [phone] 09/07/2017 8:17 AM

## 2017-09-08 LAB — CBC WITH DIFFERENTIAL/PLATELET
Basophils Absolute: 0 10*3/uL (ref 0.0–0.1)
Basophils Relative: 0 %
Eosinophils Absolute: 0.4 10*3/uL (ref 0.0–0.7)
Eosinophils Relative: 6 %
HCT: 26 % — ABNORMAL LOW (ref 39.0–52.0)
HEMOGLOBIN: 7.7 g/dL — AB (ref 13.0–17.0)
LYMPHS ABS: 1.7 10*3/uL (ref 0.7–4.0)
Lymphocytes Relative: 25 %
MCH: 23.5 pg — AB (ref 26.0–34.0)
MCHC: 29.6 g/dL — ABNORMAL LOW (ref 30.0–36.0)
MCV: 79.3 fL (ref 78.0–100.0)
Monocytes Absolute: 0.7 10*3/uL (ref 0.1–1.0)
Monocytes Relative: 10 %
NEUTROS PCT: 59 %
Neutro Abs: 4 10*3/uL (ref 1.7–7.7)
Platelets: 338 10*3/uL (ref 150–400)
RBC: 3.28 MIL/uL — AB (ref 4.22–5.81)
RDW: 15.9 % — ABNORMAL HIGH (ref 11.5–15.5)
WBC: 6.9 10*3/uL (ref 4.0–10.5)

## 2017-09-08 LAB — COMPREHENSIVE METABOLIC PANEL
ALK PHOS: 76 U/L (ref 38–126)
ALT: 17 U/L (ref 17–63)
AST: 19 U/L (ref 15–41)
Albumin: 2.4 g/dL — ABNORMAL LOW (ref 3.5–5.0)
Anion gap: 6 (ref 5–15)
BUN: 16 mg/dL (ref 6–20)
CO2: 23 mmol/L (ref 22–32)
CREATININE: 0.44 mg/dL — AB (ref 0.61–1.24)
Calcium: 8.2 mg/dL — ABNORMAL LOW (ref 8.9–10.3)
Chloride: 113 mmol/L — ABNORMAL HIGH (ref 101–111)
Glucose, Bld: 96 mg/dL (ref 65–99)
Potassium: 4 mmol/L (ref 3.5–5.1)
SODIUM: 142 mmol/L (ref 135–145)
Total Bilirubin: 0.3 mg/dL (ref 0.3–1.2)
Total Protein: 6.4 g/dL — ABNORMAL LOW (ref 6.5–8.1)

## 2017-09-08 LAB — URINE CULTURE

## 2017-09-08 LAB — MAGNESIUM: Magnesium: 0.8 mg/dL — CL (ref 1.7–2.4)

## 2017-09-08 LAB — HIV ANTIBODY (ROUTINE TESTING W REFLEX): HIV SCREEN 4TH GENERATION: NONREACTIVE

## 2017-09-08 MED ORDER — MAGNESIUM OXIDE 400 (241.3 MG) MG PO TABS
800.0000 mg | ORAL_TABLET | Freq: Two times a day (BID) | ORAL | Status: DC
  Administered 2017-09-08 – 2017-09-09 (×3): 800 mg via ORAL
  Filled 2017-09-08 (×4): qty 2

## 2017-09-08 MED ORDER — MUPIROCIN 2 % EX OINT
TOPICAL_OINTMENT | Freq: Two times a day (BID) | CUTANEOUS | Status: DC
  Administered 2017-09-08 – 2017-09-09 (×3): via NASAL
  Filled 2017-09-08 (×2): qty 22

## 2017-09-08 MED ORDER — MAGNESIUM SULFATE 2 GM/50ML IV SOLN
2.0000 g | Freq: Once | INTRAVENOUS | Status: AC
  Administered 2017-09-08: 2 g via INTRAVENOUS
  Filled 2017-09-08: qty 50
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
## 2017-09-09 LAB — MAGNESIUM: Magnesium: 1.1 mg/dL — ABNORMAL LOW (ref 1.7–2.4)

## 2017-09-09 LAB — CBC WITH DIFFERENTIAL/PLATELET
BASOS ABS: 0 10*3/uL (ref 0.0–0.1)
Basophils Relative: 0 %
EOS PCT: 6 %
Eosinophils Absolute: 0.4 10*3/uL (ref 0.0–0.7)
HCT: 27.2 % — ABNORMAL LOW (ref 39.0–52.0)
Hemoglobin: 7.9 g/dL — ABNORMAL LOW (ref 13.0–17.0)
LYMPHS ABS: 2.2 10*3/uL (ref 0.7–4.0)
LYMPHS PCT: 29 %
MCH: 23.1 pg — AB (ref 26.0–34.0)
MCHC: 29 g/dL — AB (ref 30.0–36.0)
MCV: 79.5 fL (ref 78.0–100.0)
MONO ABS: 0.7 10*3/uL (ref 0.1–1.0)
MONOS PCT: 9 %
NEUTROS ABS: 4.1 10*3/uL (ref 1.7–7.7)
Neutrophils Relative %: 56 %
PLATELETS: 339 10*3/uL (ref 150–400)
RBC: 3.42 MIL/uL — ABNORMAL LOW (ref 4.22–5.81)
RDW: 15.8 % — ABNORMAL HIGH (ref 11.5–15.5)
WBC: 7.4 10*3/uL (ref 4.0–10.5)

## 2017-09-09 LAB — BASIC METABOLIC PANEL
ANION GAP: 10 (ref 5–15)
BUN: 15 mg/dL (ref 6–20)
CO2: 24 mmol/L (ref 22–32)
Calcium: 8.6 mg/dL — ABNORMAL LOW (ref 8.9–10.3)
Chloride: 107 mmol/L (ref 101–111)
Creatinine, Ser: 0.55 mg/dL — ABNORMAL LOW (ref 0.61–1.24)
GFR calc Af Amer: >60 mL/min (ref >60)
GFR calc non Af Amer: >60 mL/min (ref >60)
GLUCOSE: 106 mg/dL — AB (ref 65–99)
Potassium: 3.6 mmol/L (ref 3.5–5.1)
Sodium: 141 mmol/L (ref 135–145)

## 2017-09-09 MED ORDER — CEPHALEXIN 500 MG PO CAPS: 500.0000 mg | ORAL_CAPSULE | Freq: Three times a day (TID) | ORAL | 0 refills | Status: AC

## 2017-09-09 MED ORDER — PANTOPRAZOLE SODIUM 40 MG PO TBEC: 40.0000 mg | DELAYED_RELEASE_TABLET | Freq: Two times a day (BID) | ORAL | Status: DC

## 2017-09-09 MED ORDER — MAGNESIUM SULFATE 2 GM/50ML IV SOLN
2.0000 g | Freq: Once | INTRAVENOUS | Status: AC
  Administered 2017-09-09: 2 g via INTRAVENOUS
  Filled 2017-09-09: qty 50

## 2017-09-09 MED ORDER — MAGNESIUM OXIDE 400 (241.3 MG) MG PO TABS: 400.0000 mg | ORAL_TABLET | Freq: Two times a day (BID) | ORAL | 0 refills | Status: AC

## 2017-09-09 MED ORDER — HEPARIN SOD (PORK) LOCK FLUSH 100 UNIT/ML IV SOLN
500.0000 [IU] | INTRAVENOUS | Status: AC | PRN
  Administered 2017-09-09: 500 [IU]

## 2017-09-09 NOTE — Progress Notes (Signed)
Patient was discharged home with PTAR at 2100.VSS,  Alert and oriented and in good spirits.
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
## 2017-09-09 NOTE — Care Management Note (Signed)
Case Management Note  Patient Details  Name: MALEKO GREULICH MRN: [id number] Date of Birth: 04-Jul-1965  Subjective/Objective:    Quadriplegia, sacral decubitus ulcer, SIRS, pt had recent hospital stay in 07/18/2017, NCM spoke to him at that time and lives with his sister and active with [facility]               Action/Plan: NCM spoke to pt and active with [facility]. Contacted [facility] for resumption of care. Pt has hospital bed, hoyer lift and wheelchair at home. PTAR arranged.   Expected Discharge Date:  09/09/17               Expected Discharge Plan:  [place]  In-House Referral:  NA  Discharge planning Services  CM Consult  Post Acute Care Choice:  Home Health, Resumption of Svcs/PTA Provider Choice offered to:  Patient  DME Arranged:  N/A DME Agency:  NA  HH Arranged:  RN [place] Agency:  [facility]  Status of Service:  Completed, signed off  If discussed at [place] of Stay Meetings, dates discussed:    Additional Comments:  Erenest Rasher, RN 09/09/2017, 5:48 PM

## 2017-09-09 NOTE — Progress Notes (Signed)
Nurse reviewed discharge instructions with pt.  Pt verbalized understanding of discharge instructions. PTAR called to transport pt home.  Pt's sister is aware of discharge.

## 2017-09-11 LAB — CULTURE, BLOOD (ROUTINE X 2)
CULTURE: NO GROWTH
Culture: NO GROWTH
Special Requests: ADEQUATE

## 2017-09-16 DIAGNOSIS — L89159 Pressure ulcer of sacral region, unspecified stage: Secondary | ICD-10-CM | POA: Diagnosis not present

## 2017-09-16 DIAGNOSIS — G8250 Quadriplegia, unspecified: Secondary | ICD-10-CM | POA: Diagnosis not present

## 2017-09-16 DIAGNOSIS — A419 Sepsis, unspecified organism: Secondary | ICD-10-CM | POA: Diagnosis not present

## 2017-09-16 DIAGNOSIS — L89119 Pressure ulcer of right upper back, unspecified stage: Secondary | ICD-10-CM | POA: Diagnosis not present

## 2017-09-18 ENCOUNTER — Encounter (HOSPITAL_BASED_OUTPATIENT_CLINIC_OR_DEPARTMENT_OTHER): Payer: Medicare Other | Attending: Internal Medicine

## 2017-09-18 DIAGNOSIS — S91301A Unspecified open wound, right foot, initial encounter: Secondary | ICD-10-CM | POA: Diagnosis not present

## 2017-09-18 DIAGNOSIS — L97512 Non-pressure chronic ulcer of other part of right foot with fat layer exposed: Secondary | ICD-10-CM | POA: Insufficient documentation

## 2017-09-18 DIAGNOSIS — L89213 Pressure ulcer of right hip, stage 3: Secondary | ICD-10-CM | POA: Diagnosis not present

## 2017-09-18 DIAGNOSIS — L89893 Pressure ulcer of other site, stage 3: Secondary | ICD-10-CM | POA: Diagnosis not present

## 2017-09-18 DIAGNOSIS — I10 Essential (primary) hypertension: Secondary | ICD-10-CM | POA: Insufficient documentation

## 2017-09-18 DIAGNOSIS — L89613 Pressure ulcer of right heel, stage 3: Secondary | ICD-10-CM | POA: Diagnosis not present

## 2017-09-18 DIAGNOSIS — L89154 Pressure ulcer of sacral region, stage 4: Secondary | ICD-10-CM | POA: Diagnosis not present

## 2017-09-18 DIAGNOSIS — G4730 Sleep apnea, unspecified: Secondary | ICD-10-CM | POA: Insufficient documentation

## 2017-09-18 DIAGNOSIS — S31100A Unspecified open wound of abdominal wall, right upper quadrant without penetration into peritoneal cavity, initial encounter: Secondary | ICD-10-CM | POA: Diagnosis not present

## 2017-09-18 DIAGNOSIS — G8250 Quadriplegia, unspecified: Secondary | ICD-10-CM | POA: Diagnosis not present

## 2017-09-18 DIAGNOSIS — L89104 Pressure ulcer of unspecified part of back, stage 4: Secondary | ICD-10-CM | POA: Diagnosis not present

## 2017-09-18 DIAGNOSIS — L89212 Pressure ulcer of right hip, stage 2: Secondary | ICD-10-CM | POA: Diagnosis not present

## 2017-10-01 DIAGNOSIS — L89154 Pressure ulcer of sacral region, stage 4: Secondary | ICD-10-CM | POA: Diagnosis not present

## 2017-10-01 DIAGNOSIS — L89159 Pressure ulcer of sacral region, unspecified stage: Secondary | ICD-10-CM | POA: Diagnosis not present

## 2017-10-01 DIAGNOSIS — G8250 Quadriplegia, unspecified: Secondary | ICD-10-CM | POA: Diagnosis not present

## 2017-10-01 DIAGNOSIS — L89119 Pressure ulcer of right upper back, unspecified stage: Secondary | ICD-10-CM | POA: Diagnosis not present

## 2017-10-02 ENCOUNTER — Emergency Department (HOSPITAL_COMMUNITY): Payer: Medicare Other

## 2017-10-02 ENCOUNTER — Other Ambulatory Visit: Payer: Self-pay

## 2017-10-02 ENCOUNTER — Encounter (HOSPITAL_COMMUNITY): Payer: Self-pay

## 2017-10-02 ENCOUNTER — Inpatient Hospital Stay (HOSPITAL_COMMUNITY)
Admission: EM | Admit: 2017-10-02 | Discharge: 2017-10-07 | DRG: 871 | Disposition: A | Discharge status: Home Health | Payer: Medicare Other | Attending: Family Medicine | Admitting: Family Medicine

## 2017-10-02 DIAGNOSIS — Z803 Family history of malignant neoplasm of breast: Secondary | ICD-10-CM

## 2017-10-02 DIAGNOSIS — Z981 Arthrodesis status: Secondary | ICD-10-CM

## 2017-10-02 DIAGNOSIS — D638 Anemia in other chronic diseases classified elsewhere: Secondary | ICD-10-CM | POA: Diagnosis present

## 2017-10-02 DIAGNOSIS — K592 Neurogenic bowel, not elsewhere classified: Secondary | ICD-10-CM | POA: Diagnosis not present

## 2017-10-02 DIAGNOSIS — L89154 Pressure ulcer of sacral region, stage 4: Secondary | ICD-10-CM | POA: Diagnosis present

## 2017-10-02 DIAGNOSIS — R1110 Vomiting, unspecified: Secondary | ICD-10-CM | POA: Diagnosis not present

## 2017-10-02 DIAGNOSIS — R569 Unspecified convulsions: Secondary | ICD-10-CM | POA: Diagnosis not present

## 2017-10-02 DIAGNOSIS — J189 Pneumonia, unspecified organism: Secondary | ICD-10-CM | POA: Diagnosis not present

## 2017-10-02 DIAGNOSIS — Z888 Allergy status to other drugs, medicaments and biological substances status: Secondary | ICD-10-CM

## 2017-10-02 DIAGNOSIS — G4733 Obstructive sleep apnea (adult) (pediatric): Secondary | ICD-10-CM | POA: Diagnosis not present

## 2017-10-02 DIAGNOSIS — Z87440 Personal history of urinary (tract) infections: Secondary | ICD-10-CM

## 2017-10-02 DIAGNOSIS — N390 Urinary tract infection, site not specified: Secondary | ICD-10-CM | POA: Diagnosis not present

## 2017-10-02 DIAGNOSIS — A419 Sepsis, unspecified organism: Principal | ICD-10-CM | POA: Diagnosis present

## 2017-10-02 DIAGNOSIS — Z9359 Other cystostomy status: Secondary | ICD-10-CM

## 2017-10-02 DIAGNOSIS — J181 Lobar pneumonia, unspecified organism: Secondary | ICD-10-CM | POA: Diagnosis present

## 2017-10-02 DIAGNOSIS — R000 Tachycardia, unspecified: Secondary | ICD-10-CM | POA: Diagnosis not present

## 2017-10-02 DIAGNOSIS — I10 Essential (primary) hypertension: Secondary | ICD-10-CM | POA: Diagnosis not present

## 2017-10-02 DIAGNOSIS — R05 Cough: Secondary | ICD-10-CM | POA: Diagnosis not present

## 2017-10-02 DIAGNOSIS — N318 Other neuromuscular dysfunction of bladder: Secondary | ICD-10-CM | POA: Diagnosis present

## 2017-10-02 DIAGNOSIS — G8253 Quadriplegia, C5-C7 complete: Secondary | ICD-10-CM | POA: Diagnosis not present

## 2017-10-02 DIAGNOSIS — R112 Nausea with vomiting, unspecified: Secondary | ICD-10-CM | POA: Diagnosis not present

## 2017-10-02 DIAGNOSIS — Z933 Colostomy status: Secondary | ICD-10-CM | POA: Diagnosis not present

## 2017-10-02 DIAGNOSIS — K219 Gastro-esophageal reflux disease without esophagitis: Secondary | ICD-10-CM | POA: Diagnosis not present

## 2017-10-02 DIAGNOSIS — Z881 Allergy status to other antibiotic agents status: Secondary | ICD-10-CM | POA: Diagnosis not present

## 2017-10-02 DIAGNOSIS — Z833 Family history of diabetes mellitus: Secondary | ICD-10-CM

## 2017-10-02 DIAGNOSIS — I110 Hypertensive heart disease with heart failure: Secondary | ICD-10-CM | POA: Diagnosis not present

## 2017-10-02 DIAGNOSIS — K5909 Other constipation: Secondary | ICD-10-CM | POA: Diagnosis present

## 2017-10-02 DIAGNOSIS — Z9989 Dependence on other enabling machines and devices: Secondary | ICD-10-CM | POA: Diagnosis not present

## 2017-10-02 DIAGNOSIS — D509 Iron deficiency anemia, unspecified: Secondary | ICD-10-CM | POA: Diagnosis present

## 2017-10-02 DIAGNOSIS — E669 Obesity, unspecified: Secondary | ICD-10-CM | POA: Diagnosis present

## 2017-10-02 DIAGNOSIS — I5032 Chronic diastolic (congestive) heart failure: Secondary | ICD-10-CM | POA: Diagnosis present

## 2017-10-02 DIAGNOSIS — R109 Unspecified abdominal pain: Secondary | ICD-10-CM | POA: Diagnosis not present

## 2017-10-02 DIAGNOSIS — J155 Pneumonia due to Escherichia coli: Secondary | ICD-10-CM | POA: Diagnosis not present

## 2017-10-02 DIAGNOSIS — R9431 Abnormal electrocardiogram [ECG] [EKG]: Secondary | ICD-10-CM | POA: Diagnosis not present

## 2017-10-02 LAB — COMPREHENSIVE METABOLIC PANEL
ALT: 22 U/L (ref 17–63)
ANION GAP: 8 (ref 5–15)
AST: 23 U/L (ref 15–41)
Albumin: 3 g/dL — ABNORMAL LOW (ref 3.5–5.0)
Alkaline Phosphatase: 82 U/L (ref 38–126)
BILIRUBIN TOTAL: 0.3 mg/dL (ref 0.3–1.2)
BUN: 24 mg/dL — ABNORMAL HIGH (ref 6–20)
CO2: 23 mmol/L (ref 22–32)
Calcium: 9.1 mg/dL (ref 8.9–10.3)
Chloride: 112 mmol/L — ABNORMAL HIGH (ref 101–111)
Creatinine, Ser: 0.61 mg/dL (ref 0.61–1.24)
GFR calc Af Amer: >60 mL/min (ref >60)
Glucose, Bld: 134 mg/dL — ABNORMAL HIGH (ref 65–99)
POTASSIUM: 4.6 mmol/L (ref 3.5–5.1)
Sodium: 143 mmol/L (ref 135–145)
TOTAL PROTEIN: 8.3 g/dL — AB (ref 6.5–8.1)

## 2017-10-02 LAB — CBC WITH DIFFERENTIAL/PLATELET
Basophils Absolute: 0 10*3/uL (ref 0.0–0.1)
Basophils Relative: 0 %
EOS ABS: 0.1 10*3/uL (ref 0.0–0.7)
EOS PCT: 0 %
HCT: 31.6 % — ABNORMAL LOW (ref 39.0–52.0)
Hemoglobin: 9.6 g/dL — ABNORMAL LOW (ref 13.0–17.0)
LYMPHS ABS: 1 10*3/uL (ref 0.7–4.0)
Lymphocytes Relative: 7 %
MCH: 23.9 pg — AB (ref 26.0–34.0)
MCHC: 30.4 g/dL (ref 30.0–36.0)
MCV: 78.8 fL (ref 78.0–100.0)
MONOS PCT: 8 %
Monocytes Absolute: 1.2 10*3/uL — ABNORMAL HIGH (ref 0.1–1.0)
Neutro Abs: 12.4 10*3/uL — ABNORMAL HIGH (ref 1.7–7.7)
Neutrophils Relative %: 85 %
PLATELETS: 352 10*3/uL (ref 150–400)
RBC: 4.01 MIL/uL — ABNORMAL LOW (ref 4.22–5.81)
RDW: 16.2 % — ABNORMAL HIGH (ref 11.5–15.5)
WBC: 14.7 10*3/uL — AB (ref 4.0–10.5)

## 2017-10-02 LAB — URINALYSIS, ROUTINE W REFLEX MICROSCOPIC
Bilirubin Urine: NEGATIVE
GLUCOSE, UA: NEGATIVE mg/dL
HGB URINE DIPSTICK: NEGATIVE
Ketones, ur: NEGATIVE mg/dL
NITRITE: NEGATIVE
Protein, ur: 100 mg/dL — AB
SPECIFIC GRAVITY, URINE: 1.018 (ref 1.005–1.030)
pH: 9 — ABNORMAL HIGH (ref 5.0–8.0)

## 2017-10-02 LAB — C DIFFICILE QUICK SCREEN W PCR REFLEX
C Diff antigen: NEGATIVE
C Diff interpretation: NOT DETECTED
C Diff toxin: NEGATIVE

## 2017-10-02 LAB — LIPASE, BLOOD: Lipase: 40 U/L (ref 11–51)

## 2017-10-02 LAB — I-STAT CG4 LACTIC ACID, ED: LACTIC ACID, VENOUS: 0.59 mmol/L (ref 0.5–1.9)

## 2017-10-02 MED ORDER — PANTOPRAZOLE SODIUM 40 MG PO TBEC
40.0000 mg | DELAYED_RELEASE_TABLET | Freq: Two times a day (BID) | ORAL | Status: DC
  Administered 2017-10-03 – 2017-10-07 (×10): 40 mg via ORAL
  Filled 2017-10-02 (×11): qty 1

## 2017-10-02 MED ORDER — METOCLOPRAMIDE HCL 10 MG PO TABS
10.0000 mg | ORAL_TABLET | Freq: Three times a day (TID) | ORAL | Status: DC
  Administered 2017-10-02 – 2017-10-07 (×19): 10 mg via ORAL
  Filled 2017-10-02 (×19): qty 1

## 2017-10-02 MED ORDER — LINEZOLID 600 MG/300ML IV SOLN
600.0000 mg | Freq: Once | INTRAVENOUS | Status: AC
  Administered 2017-10-02: 600 mg via INTRAVENOUS
  Filled 2017-10-02: qty 300

## 2017-10-02 MED ORDER — ADULT MULTIVITAMIN W/MINERALS CH
1.0000 | ORAL_TABLET | Freq: Every morning | ORAL | Status: DC
  Administered 2017-10-03 – 2017-10-07 (×5): 1 via ORAL
  Filled 2017-10-02 (×5): qty 1

## 2017-10-02 MED ORDER — FENTANYL CITRATE (PF) 100 MCG/2ML IJ SOLN
50.0000 ug | Freq: Once | INTRAMUSCULAR | Status: AC | PRN
  Administered 2017-10-02: 50 ug via INTRAVENOUS
  Filled 2017-10-02: qty 2

## 2017-10-02 MED ORDER — SODIUM CHLORIDE 0.9 % IV SOLN
INTRAVENOUS | Status: DC
  Administered 2017-10-02: 1000 mL via INTRAVENOUS

## 2017-10-02 MED ORDER — OXYCODONE HCL 5 MG PO TABS
5.0000 mg | ORAL_TABLET | ORAL | Status: DC | PRN
  Administered 2017-10-04: 5 mg via ORAL
  Filled 2017-10-02: qty 1

## 2017-10-02 MED ORDER — FESOTERODINE FUMARATE ER 8 MG PO TB24
8.0000 mg | ORAL_TABLET | Freq: Every day | ORAL | Status: DC
  Administered 2017-10-03 – 2017-10-07 (×5): 8 mg via ORAL
  Filled 2017-10-02 (×6): qty 1

## 2017-10-02 MED ORDER — POLYETHYLENE GLYCOL 3350 17 G PO PACK: 17.0000 g | PACK | Freq: Every day | ORAL | Status: DC | PRN

## 2017-10-02 MED ORDER — JUVEN PO PACK
1.0000 | PACK | Freq: Every day | ORAL | Status: DC
Start: 2017-10-03 — End: 2017-10-04
  Administered 2017-10-04: 1 via ORAL
  Filled 2017-10-02 (×2): qty 1

## 2017-10-02 MED ORDER — SODIUM CHLORIDE 0.9 % IV BOLUS
1000.0000 mL | Freq: Once | INTRAVENOUS | Status: AC
  Administered 2017-10-02: 1000 mL via INTRAVENOUS

## 2017-10-02 MED ORDER — SODIUM CHLORIDE 0.9 % IV BOLUS
1000.0000 mL | Freq: Once | INTRAVENOUS | Status: AC
Start: 2017-10-02 — End: 2017-10-02
  Administered 2017-10-02: 1000 mL via INTRAVENOUS

## 2017-10-02 MED ORDER — ACETAMINOPHEN 325 MG PO TABS: 650.0000 mg | ORAL_TABLET | Freq: Four times a day (QID) | ORAL | Status: DC | PRN

## 2017-10-02 MED ORDER — ACETAMINOPHEN 650 MG RE SUPP: 650.0000 mg | Freq: Four times a day (QID) | RECTAL | Status: DC | PRN

## 2017-10-02 MED ORDER — IOPAMIDOL (ISOVUE-300) INJECTION 61%
INTRAVENOUS | Status: AC
  Administered 2017-10-02: 100 mL
  Filled 2017-10-02: qty 100

## 2017-10-02 MED ORDER — ACETAMINOPHEN 500 MG PO TABS
1000.0000 mg | ORAL_TABLET | Freq: Once | ORAL | Status: AC
  Administered 2017-10-02: 1000 mg via ORAL
  Filled 2017-10-02: qty 2

## 2017-10-02 MED ORDER — ONDANSETRON HCL 4 MG PO TABS: 4.0000 mg | ORAL_TABLET | Freq: Four times a day (QID) | ORAL | Status: DC | PRN

## 2017-10-02 MED ORDER — ONDANSETRON HCL 4 MG/2ML IJ SOLN
4.0000 mg | Freq: Four times a day (QID) | INTRAMUSCULAR | Status: DC | PRN
  Administered 2017-10-02: 4 mg via INTRAVENOUS
  Filled 2017-10-02: qty 2

## 2017-10-02 MED ORDER — ENOXAPARIN SODIUM 40 MG/0.4ML ~~LOC~~ SOLN
40.0000 mg | SUBCUTANEOUS | Status: DC
  Administered 2017-10-02 – 2017-10-06 (×5): 40 mg via SUBCUTANEOUS
  Filled 2017-10-02 (×5): qty 0.4

## 2017-10-02 MED ORDER — SUCRALFATE 1 G PO TABS
1.0000 g | ORAL_TABLET | Freq: Four times a day (QID) | ORAL | Status: DC
Start: 2017-10-02 — End: 2017-10-07
  Administered 2017-10-02 – 2017-10-07 (×20): 1 g via ORAL
  Filled 2017-10-02 (×20): qty 1

## 2017-10-02 MED ORDER — LINEZOLID 600 MG/300ML IV SOLN
600.0000 mg | Freq: Two times a day (BID) | INTRAVENOUS | Status: DC
Start: 2017-10-03 — End: 2017-10-05
  Administered 2017-10-03 – 2017-10-05 (×5): 600 mg via INTRAVENOUS
  Filled 2017-10-02 (×6): qty 300

## 2017-10-02 MED ORDER — SODIUM CHLORIDE 0.9 % IV SOLN
2.0000 g | Freq: Three times a day (TID) | INTRAVENOUS | Status: DC
  Administered 2017-10-02 – 2017-10-05 (×9): 2 g via INTRAVENOUS
  Filled 2017-10-02 (×10): qty 2

## 2017-10-02 MED ORDER — SODIUM CHLORIDE 0.9 % IV SOLN
2.0000 g | Freq: Once | INTRAVENOUS | Status: AC
  Administered 2017-10-02: 2 g via INTRAVENOUS
  Filled 2017-10-02: qty 2

## 2017-10-02 MED ORDER — SODIUM CHLORIDE 0.9% FLUSH
10.0000 mL | INTRAVENOUS | Status: DC | PRN
  Administered 2017-10-02: 10 mL
  Administered 2017-10-03: 40 mL
  Administered 2017-10-04 – 2017-10-05 (×2): 10 mL
  Filled 2017-10-02 (×3): qty 40

## 2017-10-02 MED ORDER — FERROUS SULFATE 325 (65 FE) MG PO TABS
325.0000 mg | ORAL_TABLET | Freq: Three times a day (TID) | ORAL | Status: DC
  Administered 2017-10-03 – 2017-10-07 (×14): 325 mg via ORAL
  Filled 2017-10-02 (×14): qty 1

## 2017-10-02 MED ORDER — BACLOFEN 20 MG PO TABS
20.0000 mg | ORAL_TABLET | Freq: Four times a day (QID) | ORAL | Status: DC
  Administered 2017-10-02 – 2017-10-07 (×20): 20 mg via ORAL
  Filled 2017-10-02 (×20): qty 1

## 2017-10-02 NOTE — ED Notes (Signed)
I spoke with Dr Herbert Moors to confirm he wanted the patient in med surgical bed and not tele and he stated it was ok for patient to be on med surgical floor as was put in the admission order.
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
## 2017-10-02 NOTE — ED Triage Notes (Addendum)
Patient is a quadriplegic and has home hoyer lift pad with him in the bed. Patient  brought in by EMS from home with complaints of abdominal discomfort 6of10 and nausea/vomiting since 0400. Patient does endorse increased "burping" which makes his pain worse. Abdominal distension noted in triage. Patient does have bedsores on his bottom and back noted in triage.

## 2017-10-02 NOTE — Progress Notes (Signed)
A consult was received from an ED physician for Cefepime + MRSA coverage per pharmacy dosing for HCAP.  The patient's profile has been reviewed for ht/wt/allergies/indication/available labs.   A one time order has been placed for Cefepime2gm & Zyvox 600mg .  Further antibiotics/pharmacy consults should be ordered by admitting physician if indicated.                       Thank you, Biagio Borg 10/02/2017  3:34 PM

## 2017-10-02 NOTE — ED Notes (Signed)
Bed: WA08 Expected date:  Expected time:  Means of arrival:  Comments: EMS-vomiting

## 2017-10-02 NOTE — ED Notes (Signed)
2nd set of blood cultures unable to be obtained due to patient poor vasculature. EDMD and EDPA aware.

## 2017-10-03 DIAGNOSIS — G8253 Quadriplegia, C5-C7 complete: Secondary | ICD-10-CM

## 2017-10-03 DIAGNOSIS — Z9989 Dependence on other enabling machines and devices: Secondary | ICD-10-CM

## 2017-10-03 DIAGNOSIS — A419 Sepsis, unspecified organism: Principal | ICD-10-CM

## 2017-10-03 DIAGNOSIS — G4733 Obstructive sleep apnea (adult) (pediatric): Secondary | ICD-10-CM

## 2017-10-03 DIAGNOSIS — Z933 Colostomy status: Secondary | ICD-10-CM

## 2017-10-03 DIAGNOSIS — N390 Urinary tract infection, site not specified: Secondary | ICD-10-CM

## 2017-10-03 DIAGNOSIS — L89154 Pressure ulcer of sacral region, stage 4: Secondary | ICD-10-CM

## 2017-10-03 DIAGNOSIS — I10 Essential (primary) hypertension: Secondary | ICD-10-CM

## 2017-10-03 LAB — GASTROINTESTINAL PANEL BY PCR, STOOL (REPLACES STOOL CULTURE)
ADENOVIRUS F40/41: NOT DETECTED
Astrovirus: NOT DETECTED
CYCLOSPORA CAYETANENSIS: NOT DETECTED
Campylobacter species: NOT DETECTED
Cryptosporidium: NOT DETECTED
ENTAMOEBA HISTOLYTICA: NOT DETECTED
ENTEROAGGREGATIVE E COLI (EAEC): NOT DETECTED
ENTEROPATHOGENIC E COLI (EPEC): NOT DETECTED
ENTEROTOXIGENIC E COLI (ETEC): NOT DETECTED
GIARDIA LAMBLIA: NOT DETECTED
Norovirus GI/GII: NOT DETECTED
Plesimonas shigelloides: NOT DETECTED
Rotavirus A: NOT DETECTED
Salmonella species: NOT DETECTED
Sapovirus (I, II, IV, and V): NOT DETECTED
Shiga like toxin producing E coli (STEC): NOT DETECTED
Shigella/Enteroinvasive E coli (EIEC): NOT DETECTED
VIBRIO CHOLERAE: NOT DETECTED
VIBRIO SPECIES: NOT DETECTED
Yersinia enterocolitica: NOT DETECTED

## 2017-10-03 LAB — CBC
HCT: 30.3 % — ABNORMAL LOW (ref 39.0–52.0)
Hemoglobin: 9.2 g/dL — ABNORMAL LOW (ref 13.0–17.0)
MCH: 23.4 pg — ABNORMAL LOW (ref 26.0–34.0)
MCHC: 30.4 g/dL (ref 30.0–36.0)
MCV: 77.1 fL — ABNORMAL LOW (ref 78.0–100.0)
Platelets: 342 K/uL (ref 150–400)
RBC: 3.93 MIL/uL — ABNORMAL LOW (ref 4.22–5.81)
RDW: 16.7 % — ABNORMAL HIGH (ref 11.5–15.5)
WBC: 11.5 K/uL — ABNORMAL HIGH (ref 4.0–10.5)

## 2017-10-03 LAB — BASIC METABOLIC PANEL
Anion gap: 8 (ref 5–15)
BUN: 12 mg/dL (ref 6–20)
CO2: 18 mmol/L — ABNORMAL LOW (ref 22–32)
Chloride: 112 mmol/L — ABNORMAL HIGH (ref 101–111)
GFR calc non Af Amer: >60 mL/min (ref >60)
Glucose, Bld: 111 mg/dL — ABNORMAL HIGH (ref 65–99)
Potassium: 5.4 mmol/L — ABNORMAL HIGH (ref 3.5–5.1)

## 2017-10-03 LAB — BASIC METABOLIC PANEL WITH GFR
Calcium: 8.3 mg/dL — ABNORMAL LOW (ref 8.9–10.3)
Creatinine, Ser: 0.48 mg/dL — ABNORMAL LOW (ref 0.61–1.24)
GFR calc Af Amer: >60 mL/min (ref >60)
Sodium: 138 mmol/L (ref 135–145)

## 2017-10-03 MED ORDER — ALTEPLASE 2 MG IJ SOLR
2.0000 mg | Freq: Once | INTRAMUSCULAR | Status: AC
  Administered 2017-10-03: 2 mg
  Filled 2017-10-03: qty 2
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
## 2017-10-03 NOTE — Consult Note (Signed)
[place] Nurse wound consult note WEll known to [facility] team. Here for UTI. Wounds are largely unchanged this visit:  Right foot:  2 cm x 1 cm x 0.2 cm Right shoulder:  11 cm x 6.2 cm x 1.5 cm  Right ischium:  3 cm x 2 cm x 4.6 cm  Sacrum:  12 cm x 3 cm x 0.3 cm Pressure Injury POA: Yes Wound NGE:XBMW pink nongranulating Drainage (amount, consistency, odor) moderate serosanguinous  Musty odor Periwound:scarring from healed wounds Dressing procedure/placement/frequency:Cleanse wounds to right shoulder, right ischium and sacrum with NS.  Apply Aquacel Ag to wound bed.  Cover with ABD pads and tap.e  Change daily.  Silicone foam dressing to right foot. Change every three days and PRN soilage.  Will not follow at this time.  Please re-consult if needed.   [facility] Nurse ostomy consult note Stoma type/location: LLQ colostomy Pouch is intact and supplies provided as needed.   Domenic Moras RN BSN [place] Pager [phone]

## 2017-10-04 ENCOUNTER — Ambulatory Visit: Payer: Self-pay | Admitting: Nurse Practitioner

## 2017-10-04 DIAGNOSIS — J155 Pneumonia due to Escherichia coli: Secondary | ICD-10-CM

## 2017-10-04 DIAGNOSIS — K5909 Other constipation: Secondary | ICD-10-CM

## 2017-10-04 LAB — BASIC METABOLIC PANEL
ANION GAP: 8 (ref 5–15)
BUN: 12 mg/dL (ref 6–20)
CHLORIDE: 110 mmol/L (ref 101–111)
CO2: 21 mmol/L — AB (ref 22–32)
Calcium: 9 mg/dL (ref 8.9–10.3)
Creatinine, Ser: 0.49 mg/dL — ABNORMAL LOW (ref 0.61–1.24)
GFR calc Af Amer: >60 mL/min (ref >60)
GFR calc non Af Amer: >60 mL/min (ref >60)
GLUCOSE: 98 mg/dL (ref 65–99)
POTASSIUM: 3.6 mmol/L (ref 3.5–5.1)
Sodium: 139 mmol/L (ref 135–145)

## 2017-10-04 LAB — CBC
HEMATOCRIT: 27.9 % — AB (ref 39.0–52.0)
HEMOGLOBIN: 8 g/dL — AB (ref 13.0–17.0)
MCH: 22.9 pg — AB (ref 26.0–34.0)
MCHC: 28.7 g/dL — ABNORMAL LOW (ref 30.0–36.0)
MCV: 79.7 fL (ref 78.0–100.0)
PLATELETS: 320 10*3/uL (ref 150–400)
RBC: 3.5 MIL/uL — AB (ref 4.22–5.81)
RDW: 16.6 % — ABNORMAL HIGH (ref 11.5–15.5)
WBC: 8.5 10*3/uL (ref 4.0–10.5)

## 2017-10-04 MED ORDER — PRO-STAT SUGAR FREE PO LIQD
30.0000 mL | Freq: Three times a day (TID) | ORAL | Status: DC
  Administered 2017-10-04 – 2017-10-07 (×11): 30 mL via ORAL
  Filled 2017-10-04 (×11): qty 30

## 2017-10-04 NOTE — Progress Notes (Signed)
Daily dressing changes performed per [facility] RN recommendations.  Pt tolerated well.
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
## 2017-10-04 NOTE — Progress Notes (Signed)
Infection control called and instructed RN to removed Enteric precautions, but continue Contact precautions.

## 2017-10-04 NOTE — Progress Notes (Signed)
Patient is active with [facility] for nursing services for wound care. They will resume care at d/c, awaiting final orders to resume. [phone]

## 2017-10-04 NOTE — Progress Notes (Signed)
Pt. set up with CPAP auto-mode per order/home regimen, humidifier filled with S/W, >20/<7-ramp on, Lg FFM, tolerated setting well, will notify when ready-after last medication given at 1130pm, RT to monitor.

## 2017-10-04 NOTE — Progress Notes (Signed)
Pt c/o L arm tingling and mild chest tightness.  EKG performed showing NS rhythm with nonspecific ST/T wave abnormalities. Vitals stable. Md paged. RN will monitor.

## 2017-10-04 NOTE — Progress Notes (Signed)
MD at bedside, assessed pt with RN present.  No new orders at this time.  RN will monitor.

## 2017-10-04 NOTE — Plan of Care (Signed)
Pt alert and oriented, resting with no complaints.  Pt feeling tired this am, RN will do dressing change per orders.

## 2017-10-05 ENCOUNTER — Inpatient Hospital Stay (HOSPITAL_COMMUNITY): Payer: Medicare Other

## 2017-10-05 ENCOUNTER — Telehealth: Payer: Self-pay | Admitting: Neurology

## 2017-10-05 LAB — CBC WITH DIFFERENTIAL/PLATELET
BASOS ABS: 0 10*3/uL (ref 0.0–0.1)
BASOS PCT: 0 %
EOS PCT: 6 %
Eosinophils Absolute: 0.5 10*3/uL (ref 0.0–0.7)
HCT: 27.8 % — ABNORMAL LOW (ref 39.0–52.0)
Hemoglobin: 8.1 g/dL — ABNORMAL LOW (ref 13.0–17.0)
LYMPHS PCT: 19 %
Lymphs Abs: 1.6 10*3/uL (ref 0.7–4.0)
MCH: 22.9 pg — ABNORMAL LOW (ref 26.0–34.0)
MCHC: 29.1 g/dL — ABNORMAL LOW (ref 30.0–36.0)
MCV: 78.5 fL (ref 78.0–100.0)
Monocytes Absolute: 0.7 10*3/uL (ref 0.1–1.0)
Monocytes Relative: 9 %
NEUTROS ABS: 5.3 10*3/uL (ref 1.7–7.7)
Neutrophils Relative %: 66 %
PLATELETS: 324 10*3/uL (ref 150–400)
RBC: 3.54 MIL/uL — ABNORMAL LOW (ref 4.22–5.81)
RDW: 16.4 % — ABNORMAL HIGH (ref 11.5–15.5)
WBC: 8.1 10*3/uL (ref 4.0–10.5)

## 2017-10-05 MED ORDER — AMOXICILLIN-POT CLAVULANATE 875-125 MG PO TABS
1.0000 | ORAL_TABLET | Freq: Two times a day (BID) | ORAL | Status: DC
  Administered 2017-10-05 – 2017-10-07 (×4): 1 via ORAL
  Filled 2017-10-05 (×4): qty 1

## 2017-10-05 NOTE — Plan of Care (Signed)
Pt alert and oriented, resting w no complaints this am. States he is feeling better today.  Wound care to be done per [facility] RN recommendations.  RN will monitor.

## 2017-10-05 NOTE — Telephone Encounter (Signed)
Received a message from [place], patient has not contacted [employer] and has not been set up with machine.   " I have reached out to the patient and so has [place] and [employer]. None of [place] have been able to make contact with the patient. I am voiding the sales order, just wanted to make you aware. "
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
## 2017-10-05 NOTE — Progress Notes (Signed)
Pt. placed on CPAP for h/s, tolerating well, aware to notify if help needed.

## 2017-10-05 NOTE — Care Management Important Message (Signed)
Important Message  Patient Details  Name: [name] [name] MRN: [id number] Date of Birth: May 14, 1966   Medicare Important Message Given:  Yes    Kerin Salen 10/05/2017, 11:14 AMImportant Message  Patient Details  Name: [name] [name] MRN: [id number] Date of Birth: 1966-04-11   Medicare Important Message Given:  Yes    Kerin Salen 10/05/2017, 11:14 AM

## 2017-10-06 NOTE — Progress Notes (Signed)
This CM filled out Medical Necessity form and printed for nursing staff to call [facility] for transport home at DC. Will need home health orders for discharge. Marney Doctor RN,BSN,NCM [phone]

## 2017-10-06 NOTE — Progress Notes (Signed)
Pt. Placed on CPAP for h/s, tolerating well.
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
## 2017-10-07 LAB — CULTURE, BLOOD (ROUTINE X 2)
CULTURE: NO GROWTH
Culture: NO GROWTH
Special Requests: ADEQUATE

## 2017-10-07 LAB — CBC WITH DIFFERENTIAL/PLATELET
Basophils Absolute: 0 10*3/uL (ref 0.0–0.1)
Basophils Relative: 1 %
Eosinophils Absolute: 0.5 10*3/uL (ref 0.0–0.7)
Eosinophils Relative: 6 %
HCT: 27.2 % — ABNORMAL LOW (ref 39.0–52.0)
HEMOGLOBIN: 8.1 g/dL — AB (ref 13.0–17.0)
LYMPHS ABS: 1.9 10*3/uL (ref 0.7–4.0)
LYMPHS PCT: 25 %
MCH: 23.3 pg — AB (ref 26.0–34.0)
MCHC: 29.8 g/dL — AB (ref 30.0–36.0)
MCV: 78.4 fL (ref 78.0–100.0)
MONOS PCT: 10 %
Monocytes Absolute: 0.7 10*3/uL (ref 0.1–1.0)
Neutro Abs: 4.6 10*3/uL (ref 1.7–7.7)
Neutrophils Relative %: 58 %
PLATELETS: 343 10*3/uL (ref 150–400)
RBC: 3.47 MIL/uL — AB (ref 4.22–5.81)
RDW: 17 % — AB (ref 11.5–15.5)
WBC: 7.8 10*3/uL (ref 4.0–10.5)

## 2017-10-07 LAB — COMPREHENSIVE METABOLIC PANEL
ALT: 22 U/L (ref 17–63)
ANION GAP: 10 (ref 5–15)
AST: 25 U/L (ref 15–41)
Albumin: 2.8 g/dL — ABNORMAL LOW (ref 3.5–5.0)
Alkaline Phosphatase: 92 U/L (ref 38–126)
BILIRUBIN TOTAL: 0.2 mg/dL — AB (ref 0.3–1.2)
BUN: 26 mg/dL — ABNORMAL HIGH (ref 6–20)
CO2: 21 mmol/L — ABNORMAL LOW (ref 22–32)
Calcium: 9 mg/dL (ref 8.9–10.3)
Chloride: 108 mmol/L (ref 101–111)
Creatinine, Ser: 0.51 mg/dL — ABNORMAL LOW (ref 0.61–1.24)
Glucose, Bld: 101 mg/dL — ABNORMAL HIGH (ref 65–99)
POTASSIUM: 3.8 mmol/L (ref 3.5–5.1)
Sodium: 139 mmol/L (ref 135–145)
TOTAL PROTEIN: 7.5 g/dL (ref 6.5–8.1)

## 2017-10-07 MED ORDER — HEPARIN SOD (PORK) LOCK FLUSH 100 UNIT/ML IV SOLN: 500.0000 [IU] | INTRAVENOUS | Status: DC | PRN

## 2017-10-07 MED ORDER — AMOXICILLIN-POT CLAVULANATE 875-125 MG PO TABS: 1.0000 | ORAL_TABLET | Freq: Two times a day (BID) | ORAL | 0 refills | Status: DC

## 2017-10-07 MED ORDER — OXYCODONE HCL 5 MG PO TABS: 5.0000 mg | ORAL_TABLET | Freq: Four times a day (QID) | ORAL | 0 refills | Status: DC | PRN

## 2017-10-07 MED ORDER — POLYETHYLENE GLYCOL 3350 17 G PO PACK: 17.0000 g | PACK | Freq: Every day | ORAL | 0 refills | Status: DC | PRN

## 2017-10-07 NOTE — Progress Notes (Signed)
Pt. placed on CPAP for h/s, humidifier refilled, tolerating well.

## 2017-10-07 NOTE — Progress Notes (Addendum)
Discharge Planning: Contacted [facility] to make aware of dc home today with resumption of care. Will fax [place] orders to [facility]. [facility] arranged. Jonnie Finner RN CCM Case Mgmt phone [phone]

## 2017-10-07 NOTE — Progress Notes (Signed)
Transport here, report given, patient loaded onto stretcher and being transferred home by EMS. Pt alert and in good spirits. Belongings with patient.
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
## 2017-10-09 LAB — FUNGUS CULTURE, BLOOD
CULTURE: NO GROWTH
SPECIAL REQUESTS: ADEQUATE

## 2017-10-11 DIAGNOSIS — Z435 Encounter for attention to cystostomy: Secondary | ICD-10-CM | POA: Diagnosis not present

## 2017-10-11 DIAGNOSIS — L89154 Pressure ulcer of sacral region, stage 4: Secondary | ICD-10-CM | POA: Diagnosis not present

## 2017-10-11 DIAGNOSIS — N390 Urinary tract infection, site not specified: Secondary | ICD-10-CM | POA: Diagnosis not present

## 2017-10-11 DIAGNOSIS — J189 Pneumonia, unspecified organism: Secondary | ICD-10-CM | POA: Diagnosis not present

## 2017-10-11 DIAGNOSIS — L89214 Pressure ulcer of right hip, stage 4: Secondary | ICD-10-CM | POA: Diagnosis not present

## 2017-10-11 DIAGNOSIS — K219 Gastro-esophageal reflux disease without esophagitis: Secondary | ICD-10-CM | POA: Diagnosis not present

## 2017-10-11 DIAGNOSIS — Z466 Encounter for fitting and adjustment of urinary device: Secondary | ICD-10-CM | POA: Diagnosis not present

## 2017-10-11 DIAGNOSIS — I10 Essential (primary) hypertension: Secondary | ICD-10-CM | POA: Diagnosis not present

## 2017-10-11 DIAGNOSIS — D509 Iron deficiency anemia, unspecified: Secondary | ICD-10-CM | POA: Diagnosis not present

## 2017-10-11 DIAGNOSIS — F329 Major depressive disorder, single episode, unspecified: Secondary | ICD-10-CM | POA: Diagnosis not present

## 2017-10-11 DIAGNOSIS — I739 Peripheral vascular disease, unspecified: Secondary | ICD-10-CM | POA: Diagnosis not present

## 2017-10-11 DIAGNOSIS — N319 Neuromuscular dysfunction of bladder, unspecified: Secondary | ICD-10-CM | POA: Diagnosis not present

## 2017-10-11 DIAGNOSIS — E662 Morbid (severe) obesity with alveolar hypoventilation: Secondary | ICD-10-CM | POA: Diagnosis not present

## 2017-10-11 DIAGNOSIS — L89114 Pressure ulcer of right upper back, stage 4: Secondary | ICD-10-CM | POA: Diagnosis not present

## 2017-10-11 DIAGNOSIS — G4733 Obstructive sleep apnea (adult) (pediatric): Secondary | ICD-10-CM | POA: Diagnosis not present

## 2017-10-11 DIAGNOSIS — G8253 Quadriplegia, C5-C7 complete: Secondary | ICD-10-CM | POA: Diagnosis not present

## 2017-10-17 ENCOUNTER — Encounter (HOSPITAL_BASED_OUTPATIENT_CLINIC_OR_DEPARTMENT_OTHER): Payer: Medicare Other | Attending: Internal Medicine

## 2017-10-17 DIAGNOSIS — G4730 Sleep apnea, unspecified: Secondary | ICD-10-CM | POA: Diagnosis not present

## 2017-10-17 DIAGNOSIS — L89219 Pressure ulcer of right hip, unspecified stage: Secondary | ICD-10-CM | POA: Diagnosis not present

## 2017-10-17 DIAGNOSIS — L89512 Pressure ulcer of right ankle, stage 2: Secondary | ICD-10-CM | POA: Diagnosis not present

## 2017-10-17 DIAGNOSIS — L89894 Pressure ulcer of other site, stage 4: Secondary | ICD-10-CM | POA: Diagnosis not present

## 2017-10-17 DIAGNOSIS — I10 Essential (primary) hypertension: Secondary | ICD-10-CM | POA: Insufficient documentation

## 2017-10-17 DIAGNOSIS — G8250 Quadriplegia, unspecified: Secondary | ICD-10-CM | POA: Insufficient documentation

## 2017-10-17 DIAGNOSIS — L89154 Pressure ulcer of sacral region, stage 4: Secondary | ICD-10-CM | POA: Diagnosis not present

## 2017-10-17 DIAGNOSIS — L89323 Pressure ulcer of left buttock, stage 3: Secondary | ICD-10-CM | POA: Insufficient documentation

## 2017-10-17 DIAGNOSIS — L89893 Pressure ulcer of other site, stage 3: Secondary | ICD-10-CM | POA: Insufficient documentation

## 2017-10-17 DIAGNOSIS — L89119 Pressure ulcer of right upper back, unspecified stage: Secondary | ICD-10-CM | POA: Diagnosis not present

## 2017-10-17 DIAGNOSIS — L89213 Pressure ulcer of right hip, stage 3: Secondary | ICD-10-CM | POA: Diagnosis not present

## 2017-10-17 DIAGNOSIS — S91301A Unspecified open wound, right foot, initial encounter: Secondary | ICD-10-CM | POA: Diagnosis not present

## 2017-10-17 DIAGNOSIS — A419 Sepsis, unspecified organism: Secondary | ICD-10-CM | POA: Diagnosis not present

## 2017-10-17 DIAGNOSIS — L89159 Pressure ulcer of sacral region, unspecified stage: Secondary | ICD-10-CM | POA: Diagnosis not present

## 2017-10-18 DIAGNOSIS — N390 Urinary tract infection, site not specified: Secondary | ICD-10-CM | POA: Diagnosis not present

## 2017-10-18 DIAGNOSIS — J189 Pneumonia, unspecified organism: Secondary | ICD-10-CM | POA: Diagnosis not present

## 2017-10-18 DIAGNOSIS — Z466 Encounter for fitting and adjustment of urinary device: Secondary | ICD-10-CM | POA: Diagnosis not present

## 2017-10-18 DIAGNOSIS — F329 Major depressive disorder, single episode, unspecified: Secondary | ICD-10-CM | POA: Diagnosis not present

## 2017-10-18 DIAGNOSIS — E662 Morbid (severe) obesity with alveolar hypoventilation: Secondary | ICD-10-CM | POA: Diagnosis not present

## 2017-10-18 DIAGNOSIS — Z435 Encounter for attention to cystostomy: Secondary | ICD-10-CM | POA: Diagnosis not present

## 2017-10-18 DIAGNOSIS — K219 Gastro-esophageal reflux disease without esophagitis: Secondary | ICD-10-CM | POA: Diagnosis not present

## 2017-10-18 DIAGNOSIS — N319 Neuromuscular dysfunction of bladder, unspecified: Secondary | ICD-10-CM | POA: Diagnosis not present

## 2017-10-18 DIAGNOSIS — I10 Essential (primary) hypertension: Secondary | ICD-10-CM | POA: Diagnosis not present

## 2017-10-18 DIAGNOSIS — L89214 Pressure ulcer of right hip, stage 4: Secondary | ICD-10-CM | POA: Diagnosis not present

## 2017-10-18 DIAGNOSIS — L89154 Pressure ulcer of sacral region, stage 4: Secondary | ICD-10-CM | POA: Diagnosis not present

## 2017-10-18 DIAGNOSIS — D509 Iron deficiency anemia, unspecified: Secondary | ICD-10-CM | POA: Diagnosis not present

## 2017-10-18 DIAGNOSIS — G8253 Quadriplegia, C5-C7 complete: Secondary | ICD-10-CM | POA: Diagnosis not present

## 2017-10-18 DIAGNOSIS — L89114 Pressure ulcer of right upper back, stage 4: Secondary | ICD-10-CM | POA: Diagnosis not present

## 2017-10-18 DIAGNOSIS — G4733 Obstructive sleep apnea (adult) (pediatric): Secondary | ICD-10-CM | POA: Diagnosis not present

## 2017-10-18 DIAGNOSIS — I739 Peripheral vascular disease, unspecified: Secondary | ICD-10-CM | POA: Diagnosis not present

## 2017-10-23 DIAGNOSIS — L89114 Pressure ulcer of right upper back, stage 4: Secondary | ICD-10-CM | POA: Diagnosis not present

## 2017-10-23 DIAGNOSIS — L89214 Pressure ulcer of right hip, stage 4: Secondary | ICD-10-CM | POA: Diagnosis not present

## 2017-10-23 DIAGNOSIS — L89154 Pressure ulcer of sacral region, stage 4: Secondary | ICD-10-CM | POA: Diagnosis not present

## 2017-10-23 DIAGNOSIS — J189 Pneumonia, unspecified organism: Secondary | ICD-10-CM | POA: Diagnosis not present

## 2017-10-27 IMAGING — DX DG ABDOMEN ACUTE W/ 1V CHEST
4 series · 4 of 4 positions shown · non-contrast
Comparison: None.

CLINICAL DATA: Nausea for one day; quadriplegia

EXAM:
DG ABDOMEN ACUTE W/ 1V CHEST

[abdomen erect]
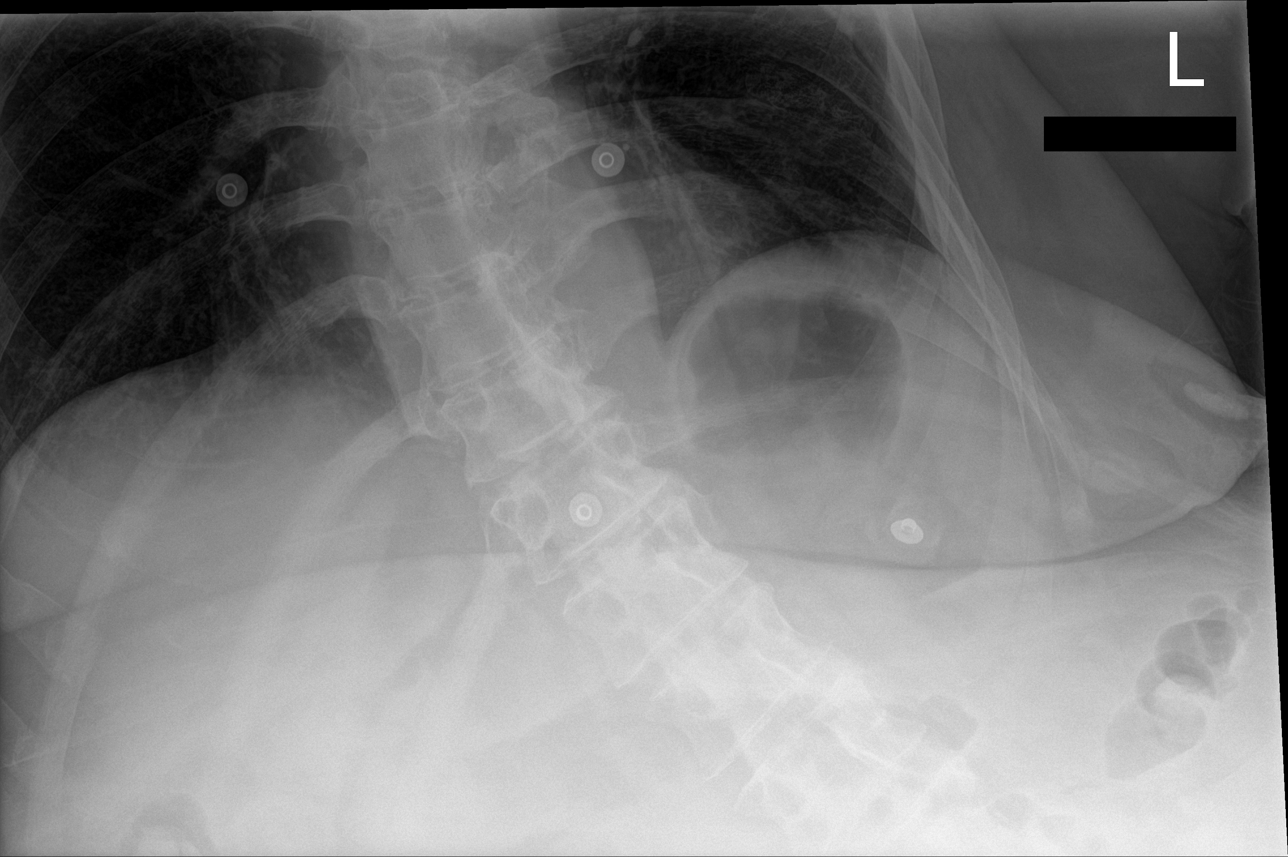

[abdomen supine (1 of 2)]
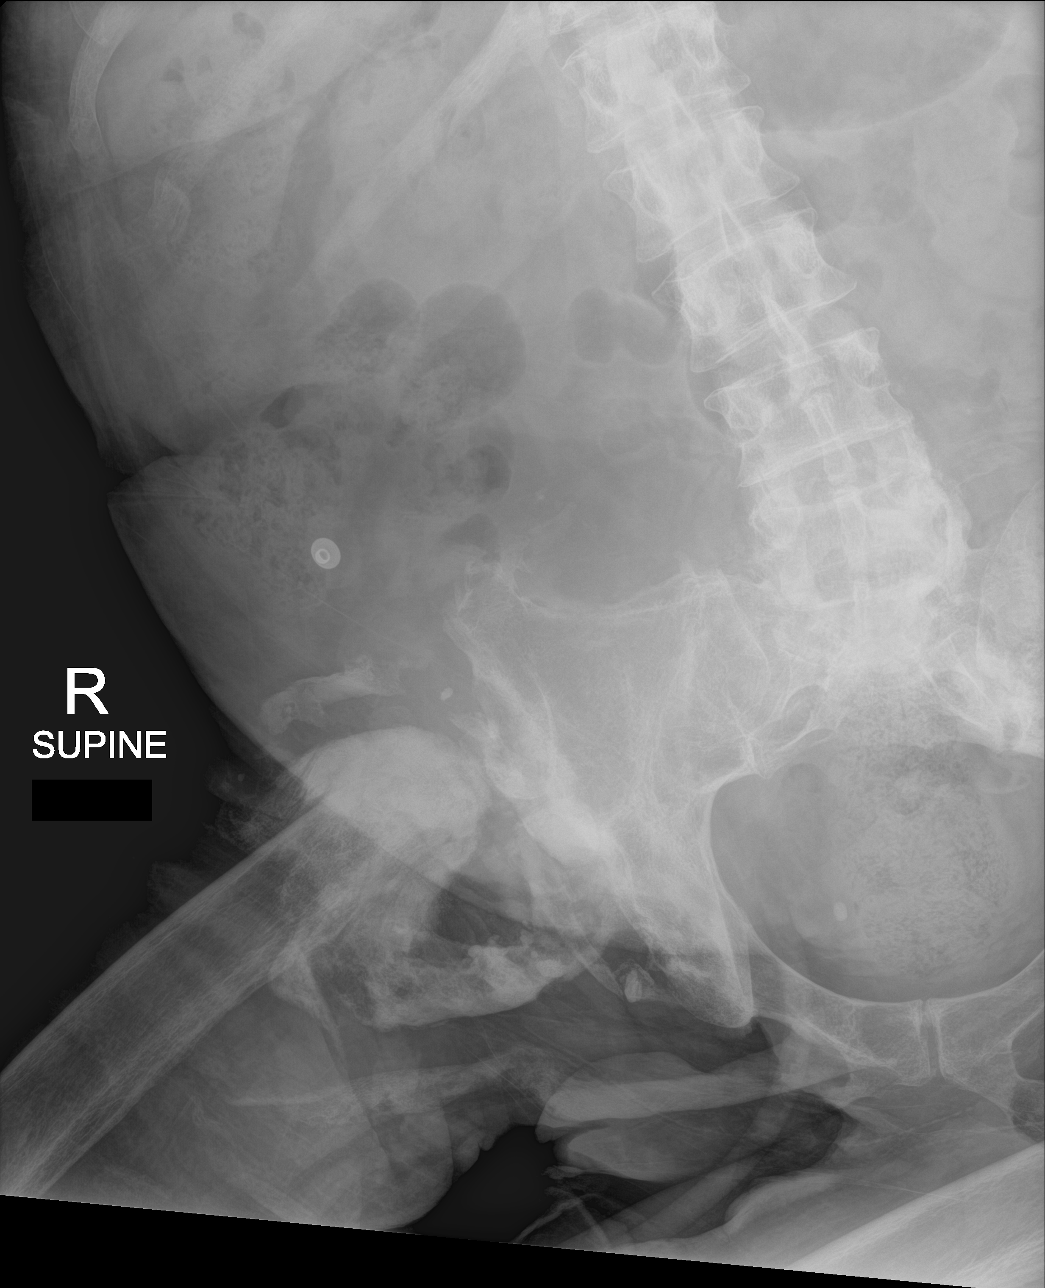

[abdomen supine (2 of 2)]
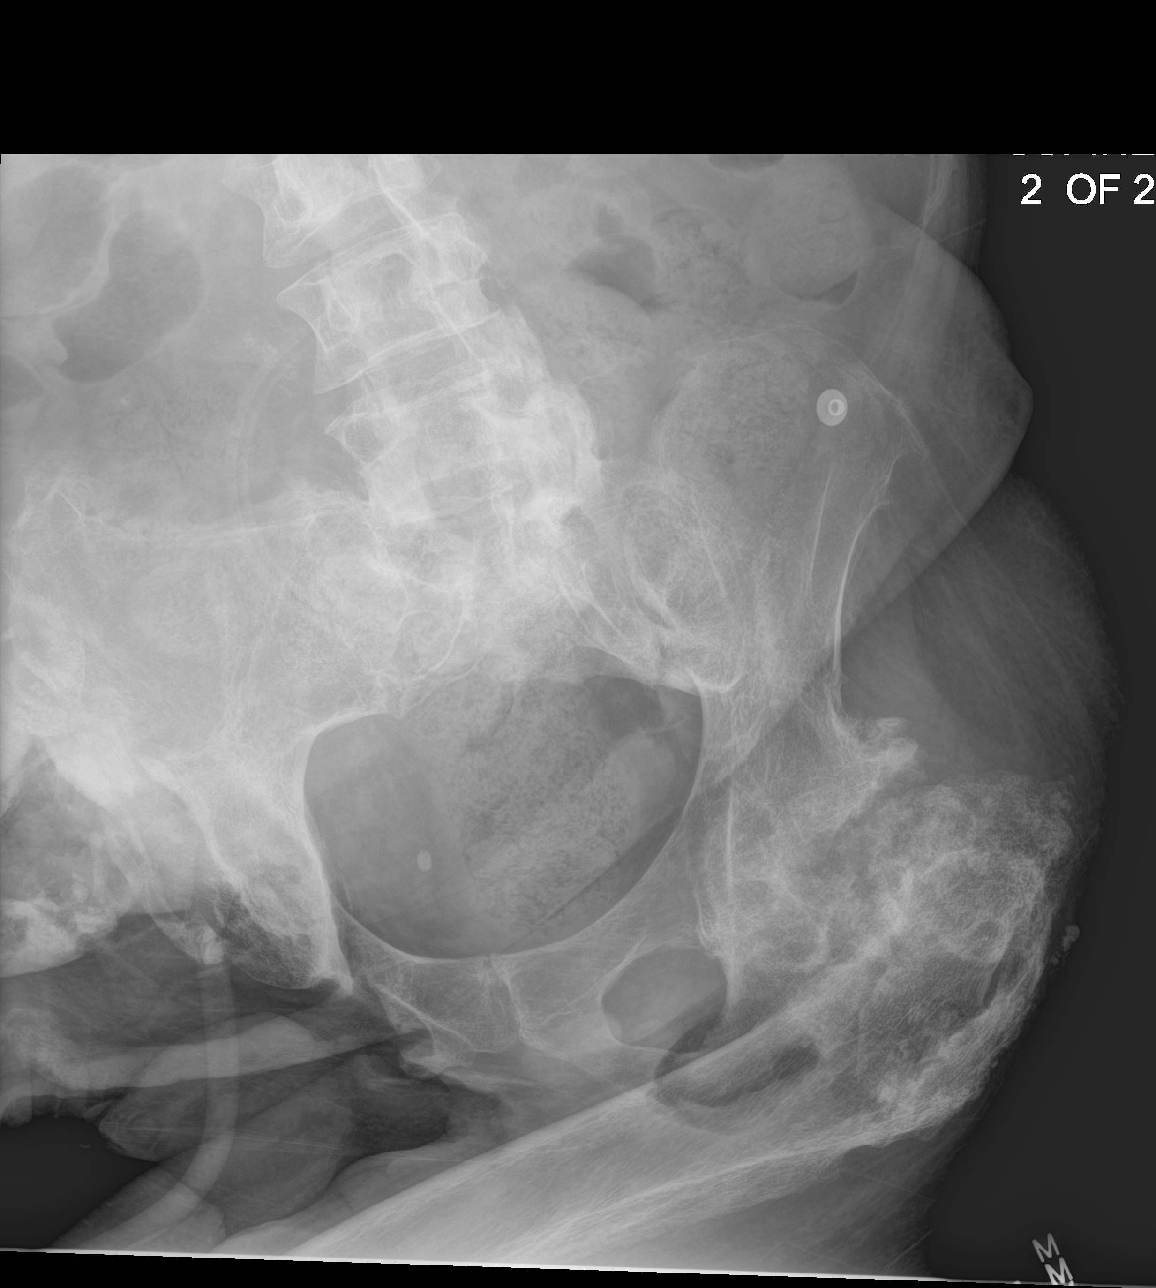

[chest ap]
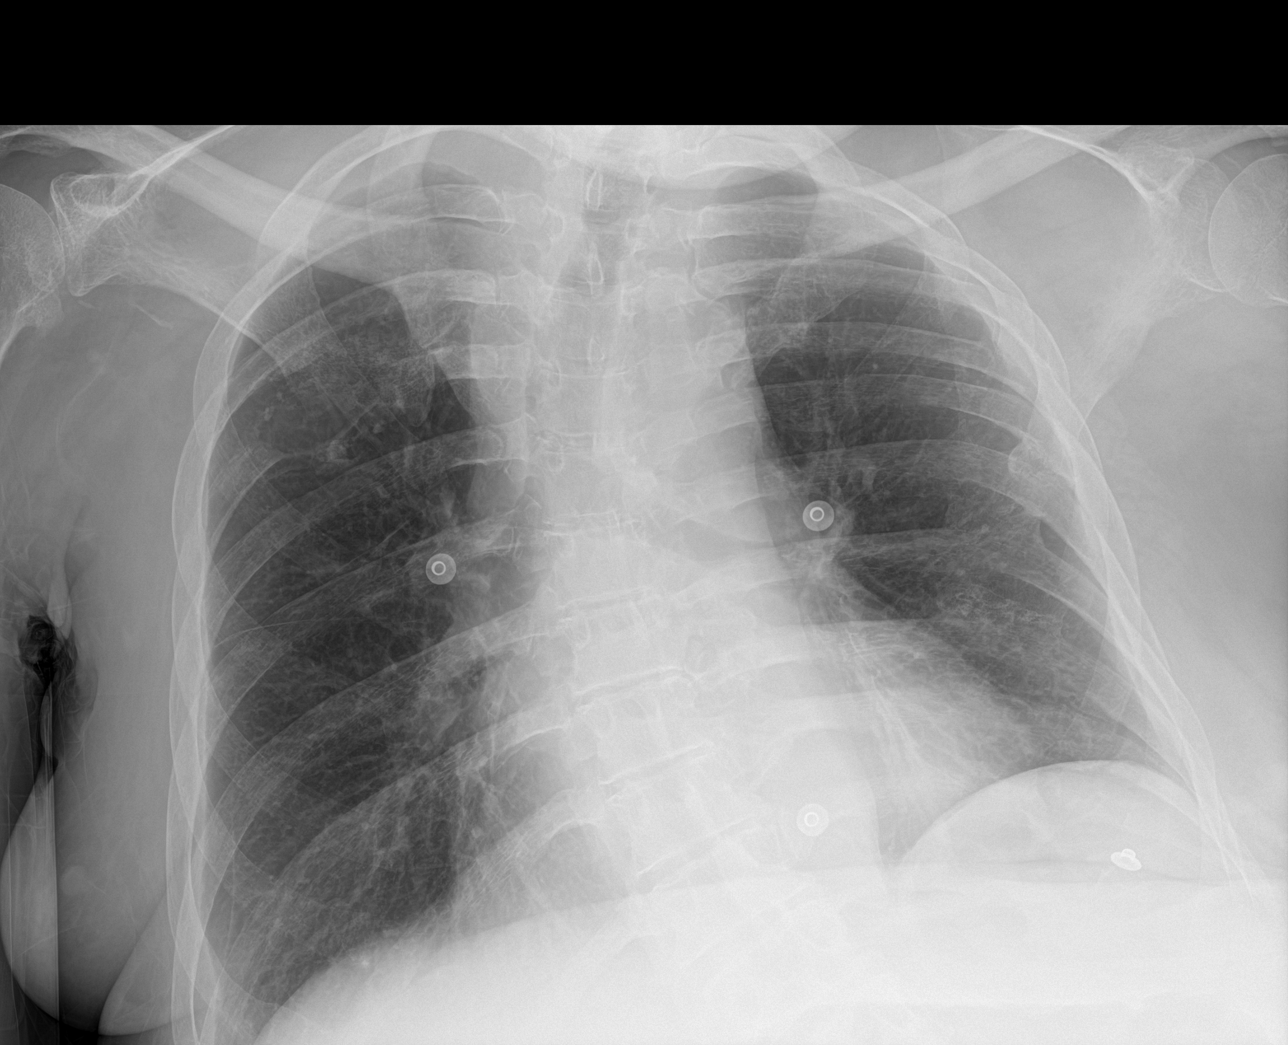

[4 of 4 positions shown; findings below may reference images not displayed]

FINDINGS: Nonobstructive gas pattern. Moderate fecal retention. Mild stool
distension of the rectum. Severe chronic bilateral hip dysplasia.No
free air.
IMPRESSION: Constipation.  No acute findings.

## 2017-10-28 IMAGING — CR DG CHEST 1V PORT
1 series · 1 of 1 positions shown · non-contrast
Comparison: 03/15/2015. Multiple previous chest radiographs. Chest
CT 10/26/2014

CLINICAL DATA: Sepsis.  Nausea and vomiting.

EXAM:
PORTABLE CHEST 1 VIEW

[AP]
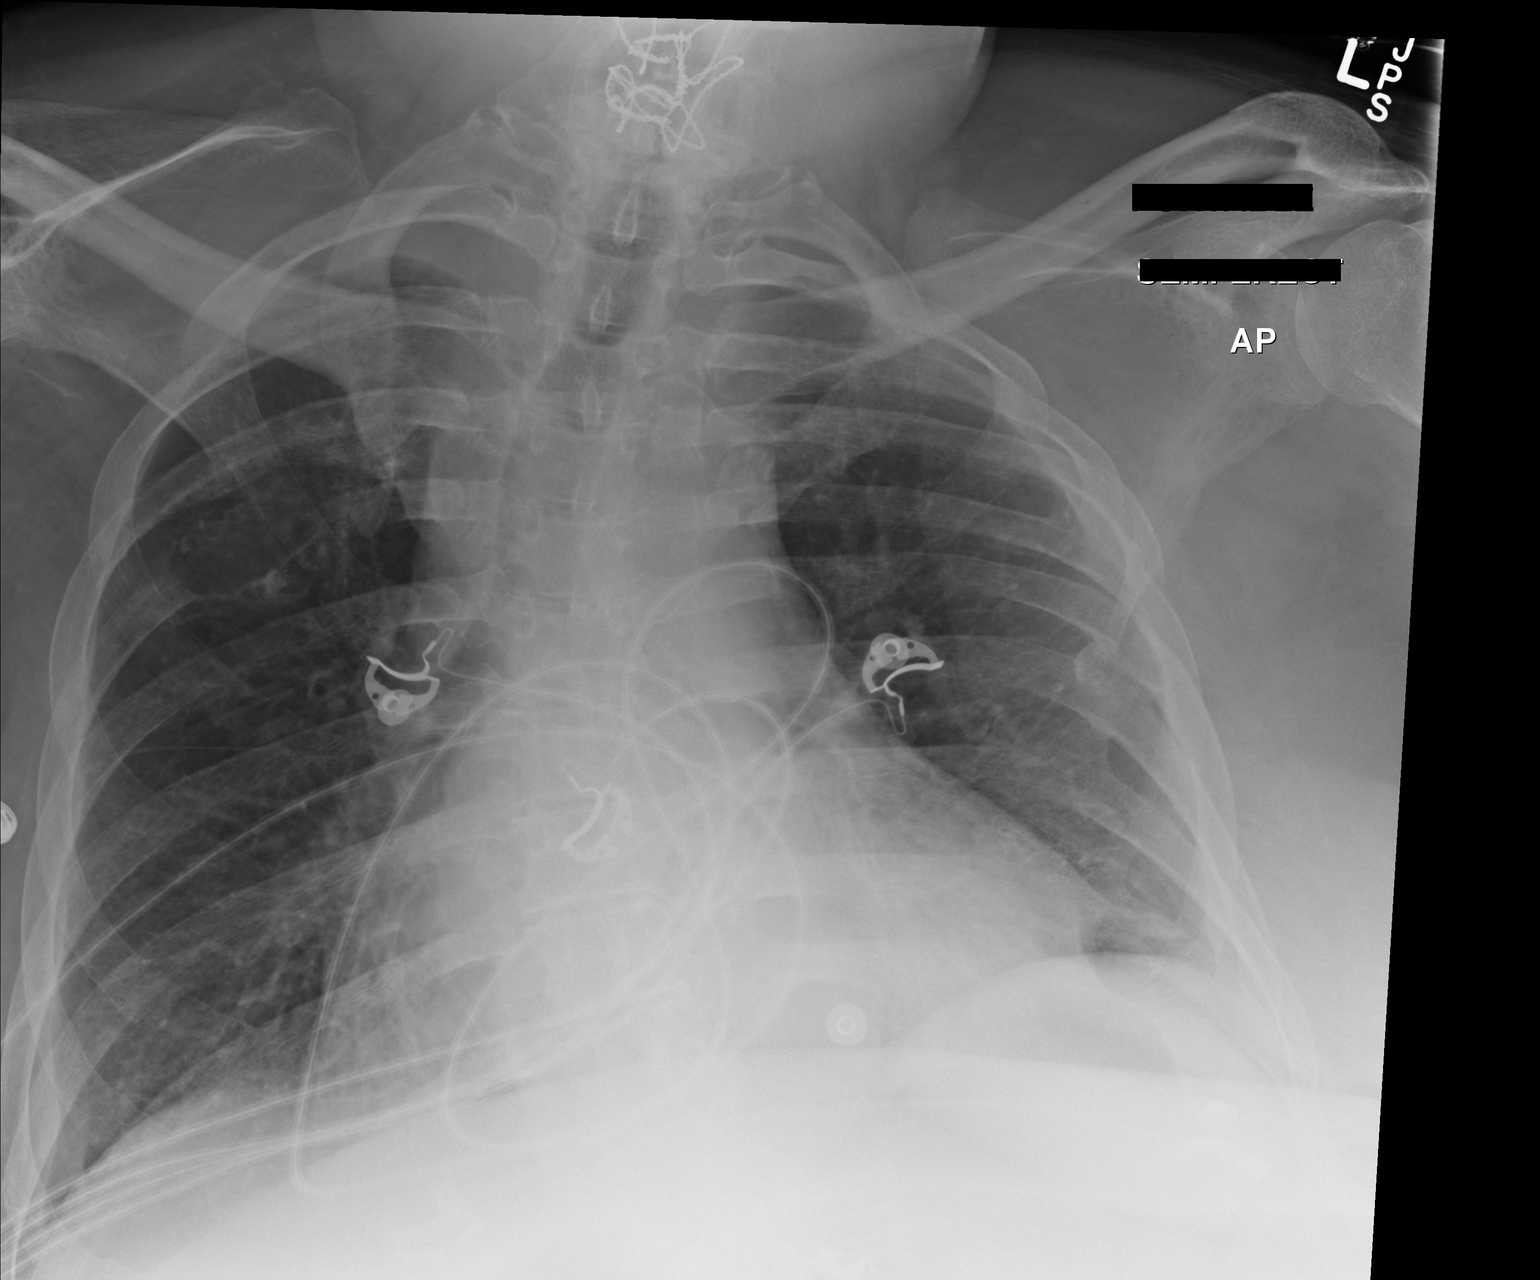

[1 of 1 positions shown; findings below may reference images not displayed]

FINDINGS: Artifact overlies chest. Heart and mediastinal shadows are normal.
There slight worsening of volume loss in the left lower lobe
compared to the previous study. Lytic change of the right posterior
fifth rib with areas of adjacent calcification probably relate to
pressure erosion of the rib because in adjacent exostosis from the
scapula. There could be local symptoms in this region related to
that.
IMPRESSION: Slight worsening of volume loss in the left lower lobe since
yesterday.

Chronic lucent bone changes of the right posterior fifth rib and
soft tissues presumably related to exostosis of the scapula with
chronic chest wall changes.

## 2017-10-28 IMAGING — CT CT ABD-PELV W/O CM
2 of 4 series · 8 of 46 positions shown, 9 images · non-contrast
Comparison: CT abdomen and pelvis December 08, 2014

CLINICAL DATA: Nausea and vomiting. Abdominal pain. Quadriplegia,
stage IV decubitus ulcers.

EXAM:
CT ABDOMEN AND PELVIS WITHOUT CONTRAST
TECHNIQUE: Multidetector CT imaging of the abdomen and pelvis was performed
following the standard protocol without IV contrast.

[Series 201: routine, idose (2) · axial · 0.95mm/px · z∈[+102,+452]mm · 5 of 94 slices shown, 6 images]
[im 12/94  soft-tissue]
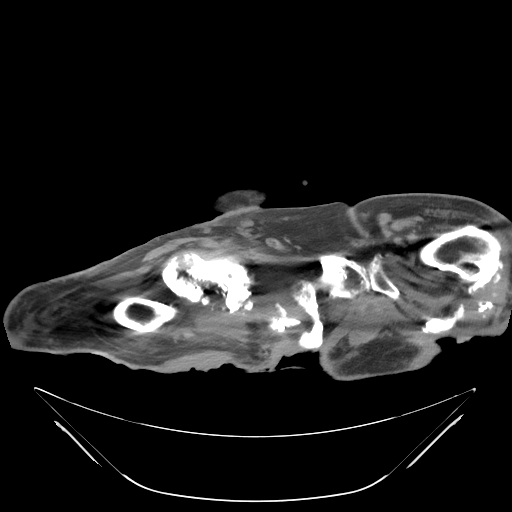
[im 12/94  bone]
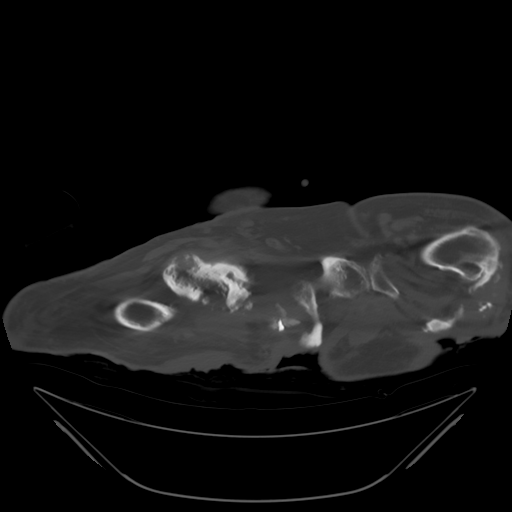
[im 30/94  soft-tissue]
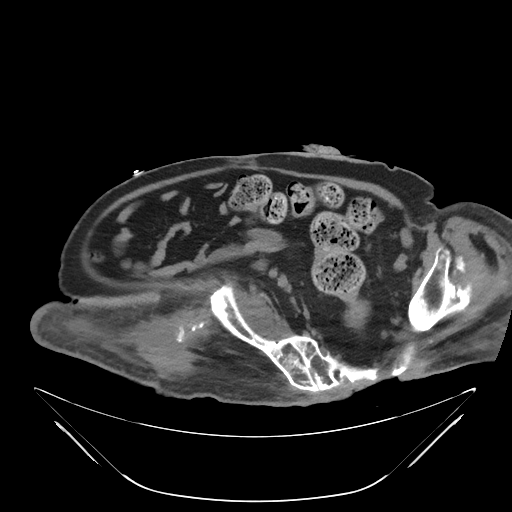
[im 49/94  soft-tissue]
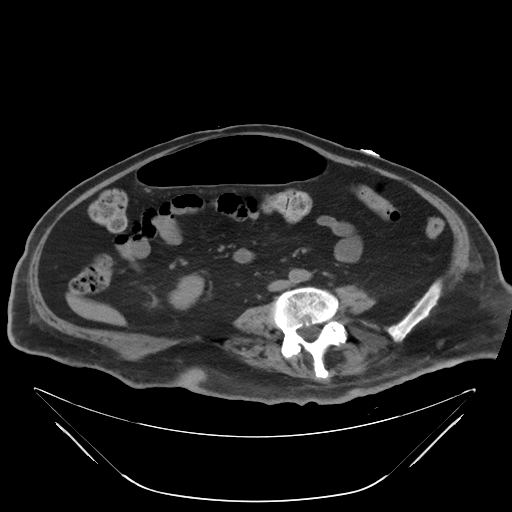
[im 64/94  soft-tissue]
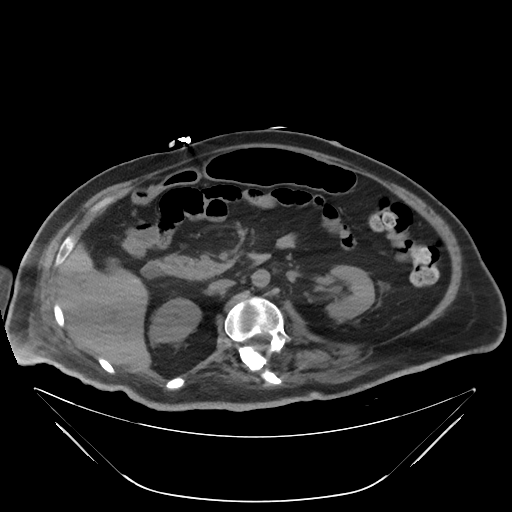
[im 82/94  soft-tissue]
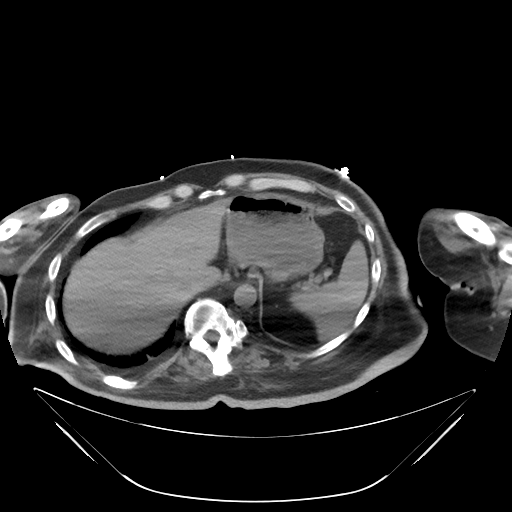

[Series 203: coronals, idose (2) · coronal · 0.45mm/px · 3 of 116 slices shown]
[im 39/116  soft-tissue]
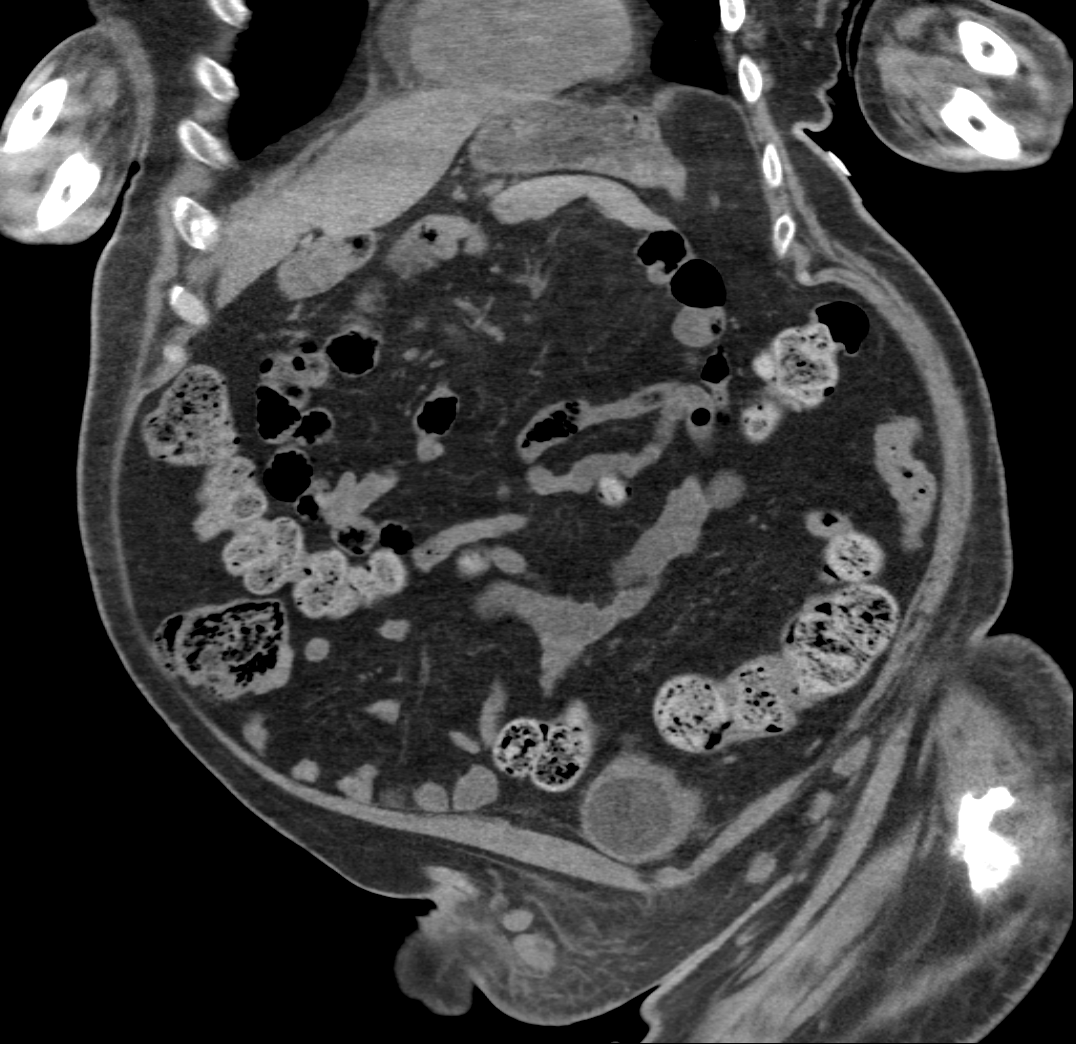
[im 52/116  soft-tissue]
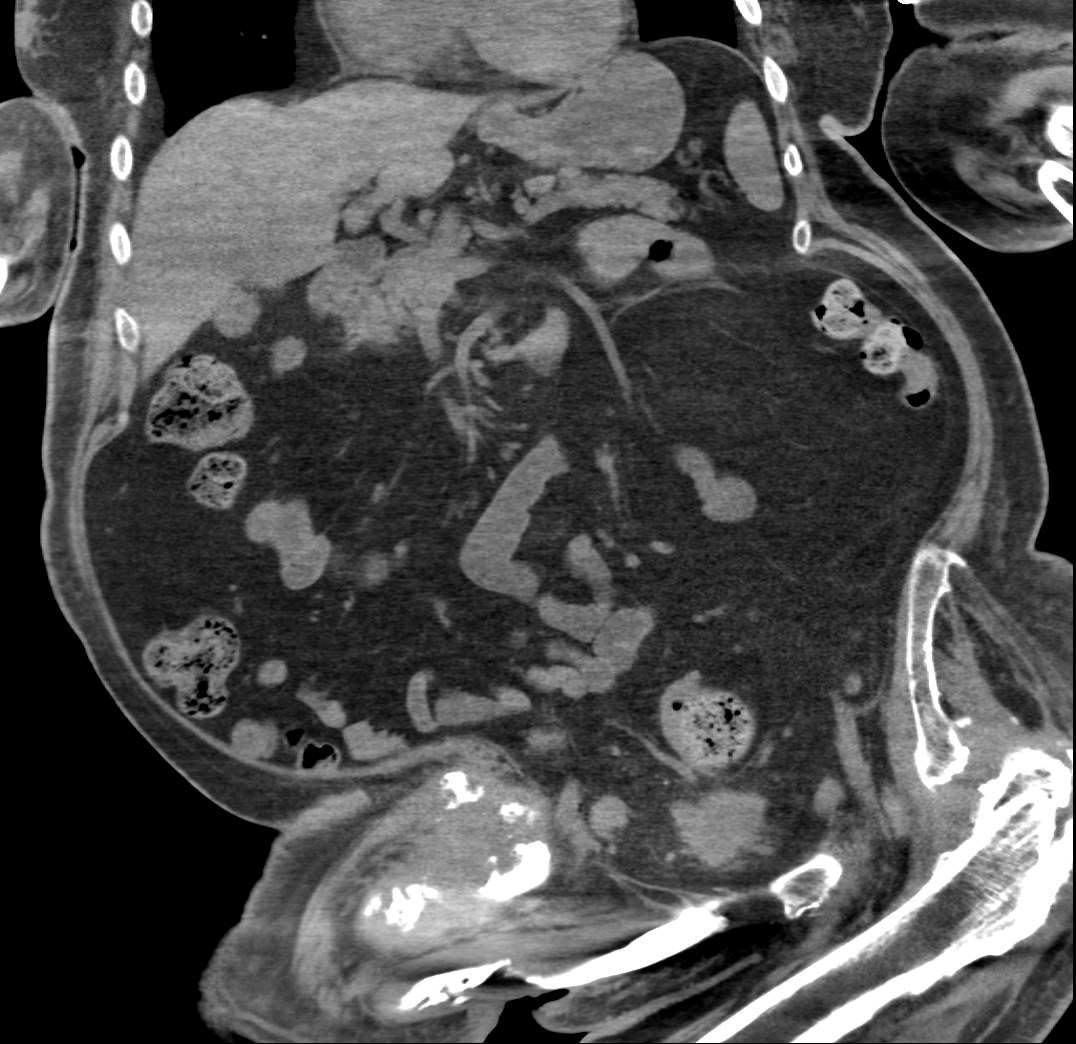
[im 64/116  soft-tissue]
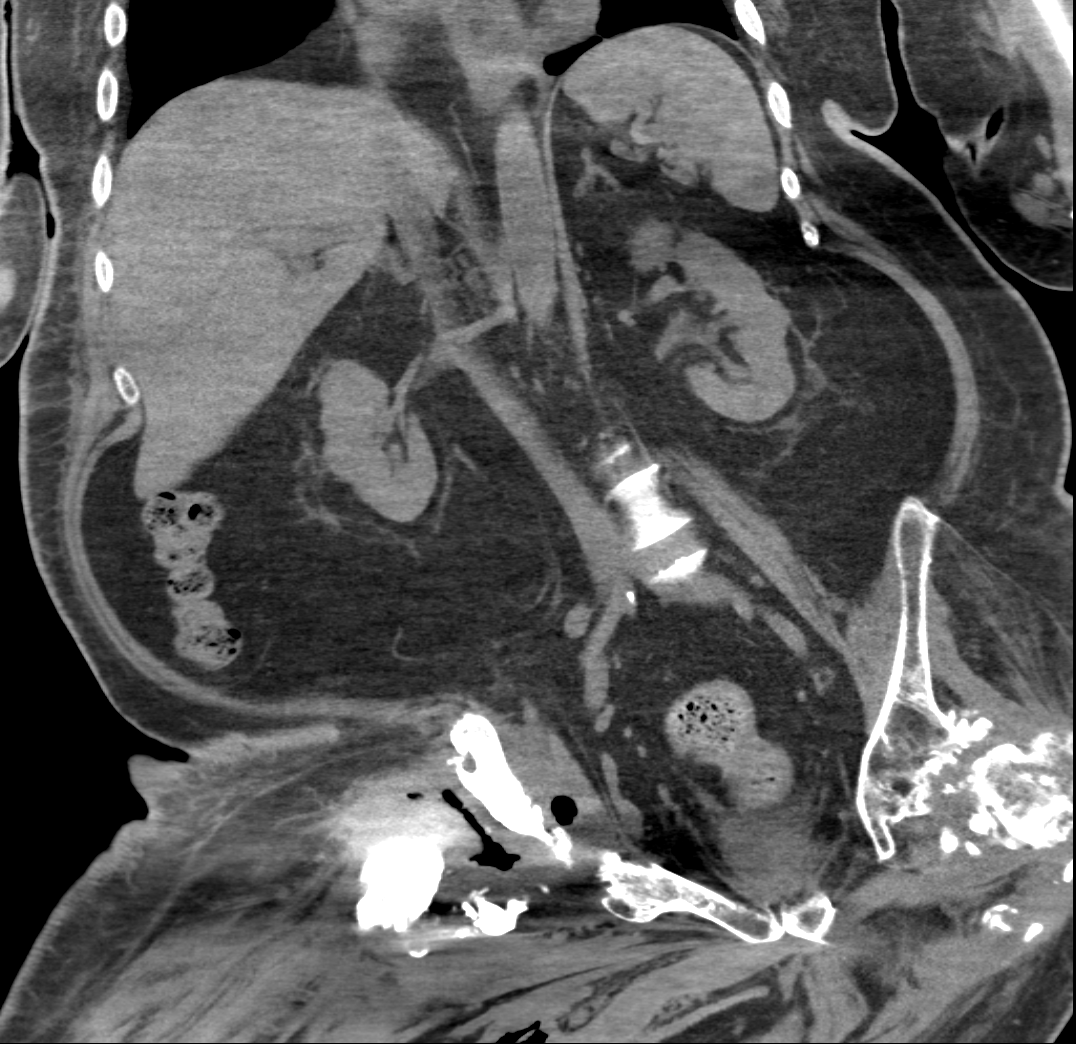

[8 of 46 positions shown; findings below may reference images not displayed]

FINDINGS: LUNG BASES: Similar LEFT lung base pleural thickening, old LEFT rib
fractures. Minimal RIGHT pleural thickening is unchanged. Included
heart size is normal, no pericardial effusions.

KIDNEYS/BLADDER: Kidneys are orthotopic, demonstrating normal size
and morphology. No nephrolithiasis, hydronephrosis; limited
assessment for renal masses on this nonenhanced examination. The
unopacified ureters are normal in course and caliber. Urinary
bladder is decompressed, via suprapubic catheter.

SOLID ORGANS: The liver, spleen, gallbladder, pancreas are
unremarkable for this non-contrast examination. Nodular thickening
of the bilateral funeral glands, suggesting hyperplasia.

GASTROINTESTINAL TRACT: Small to moderate hiatal hernia. The
stomach, small and large bowel are normal in course and caliber
without inflammatory changes, the sensitivity may be decreased by
lack of enteric contrast. LEFT lower quadrant colostomy. Moderate
amount of retained large bowel stool in pouch.

PERITONEUM/RETROPERITONEUM: Aortoiliac vessels are normal in course
and caliber, mild calcific atherosclerosis. No lymphadenopathy by CT
size criteria. Internal reproductive organs are unremarkable. No
intraperitoneal free fluid nor free air.

SOFT TISSUES/ OSSEOUS STRUCTURES: Destructive changes of bilateral
hips, and pelvic bones with heterotopic ossification, RIGHT hip
intra-articular gas and multiple ulcers/sinus tracts.
IMPRESSION: No urolithiasis, obstructive uropathy nor acute
intra-abdominal/pelvic process.

Extensive destructive changes of the pelvis, with RIGHT hip
intra-articular gas consistent with chronic osteomyelitis.

LEFT lower quadrant colostomy. However, moderate amount of retained
large bowel stool within pouch. This is likely residual fecal
material though, if there is a clinical concern for fistula, rectal
contrast may be indicated.

## 2017-10-28 IMAGING — XA IR FLUORO GUIDE CV LINE*L*
1 series · 6 of 6 positions shown · non-contrast
Comparison: none

CLINICAL DATA: 49-year-old with recurrent urinary tract infections
and poor venous access.

EXAM:
PLACEMENT OF PERIPHERALLY INSERTED CENTRAL VENOUS CATHETER (PICC)
WITH ULTRASOUND AND FLUOROSCOPIC GUIDANCE
FLUOROSCOPY TIME:  3.0 minutes, 34.5 mGy
TECHNIQUE: The procedure was explained to the patient. The risks and benefits
of the procedure were discussed and the patient's questions were
addressed. Informed consent was obtained from the patient.

[Series 1: run · 6 of 6 slices shown]
[im 1/6]
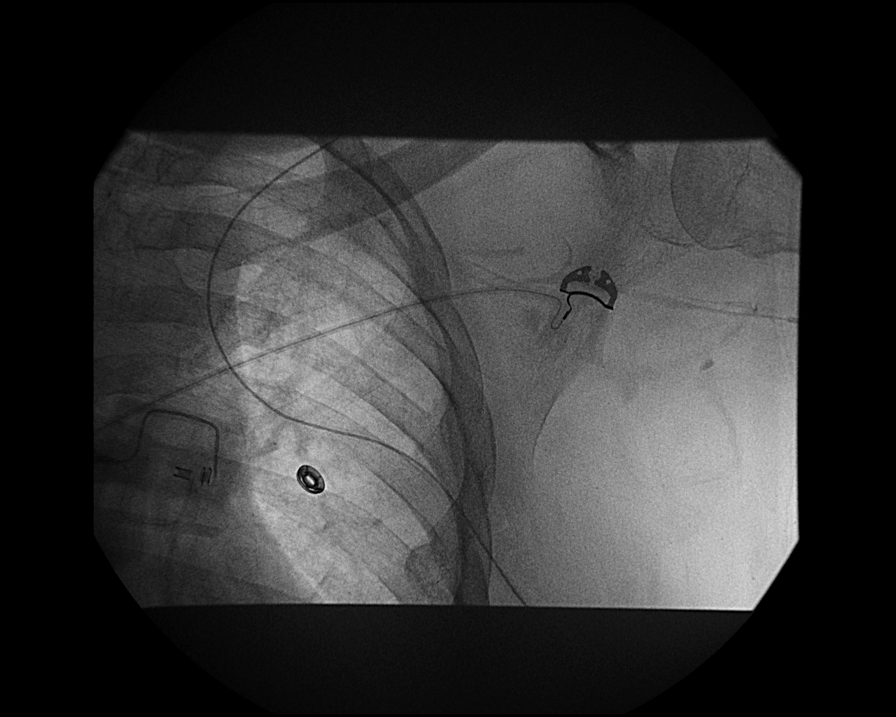
[im 2/6]
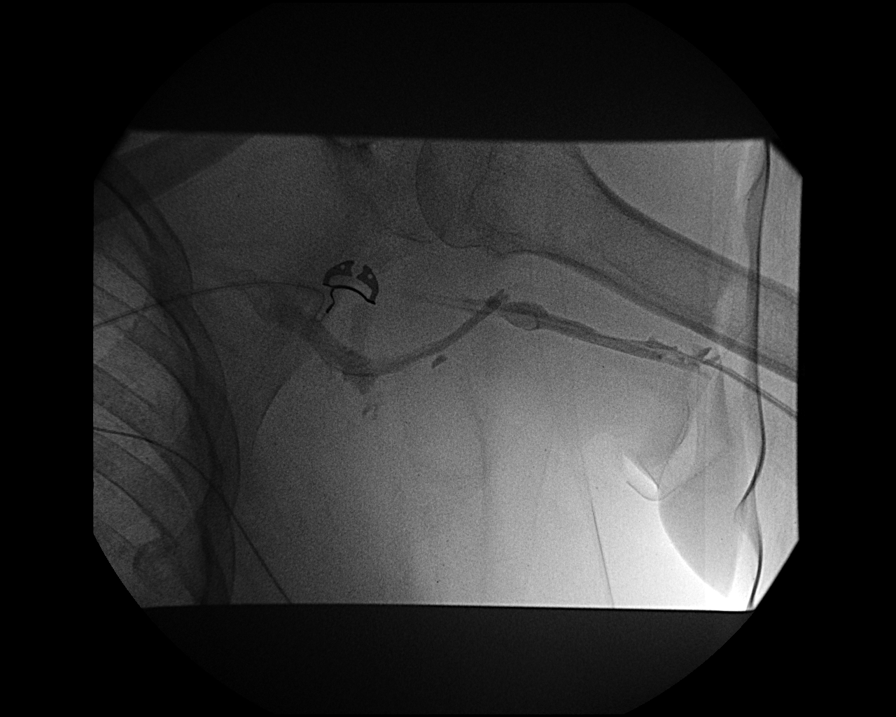
[im 3/6]
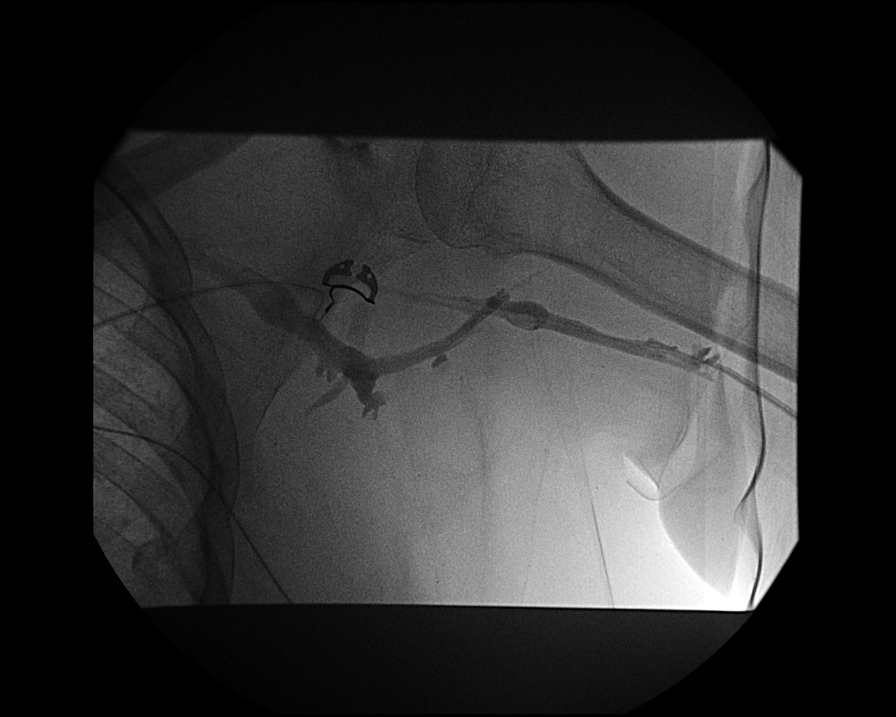
[im 4/6]
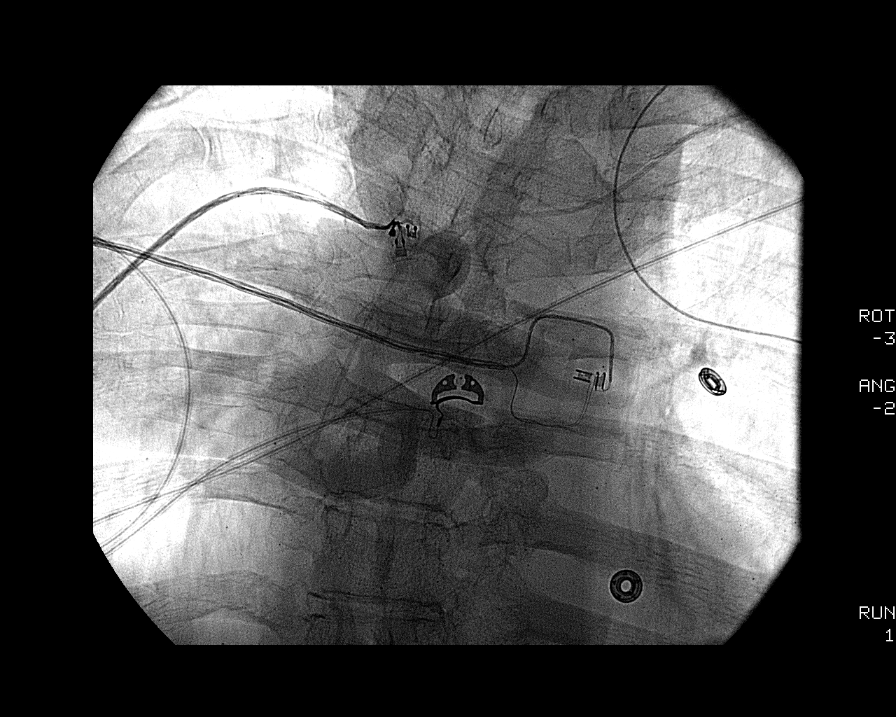
[im 5/6]
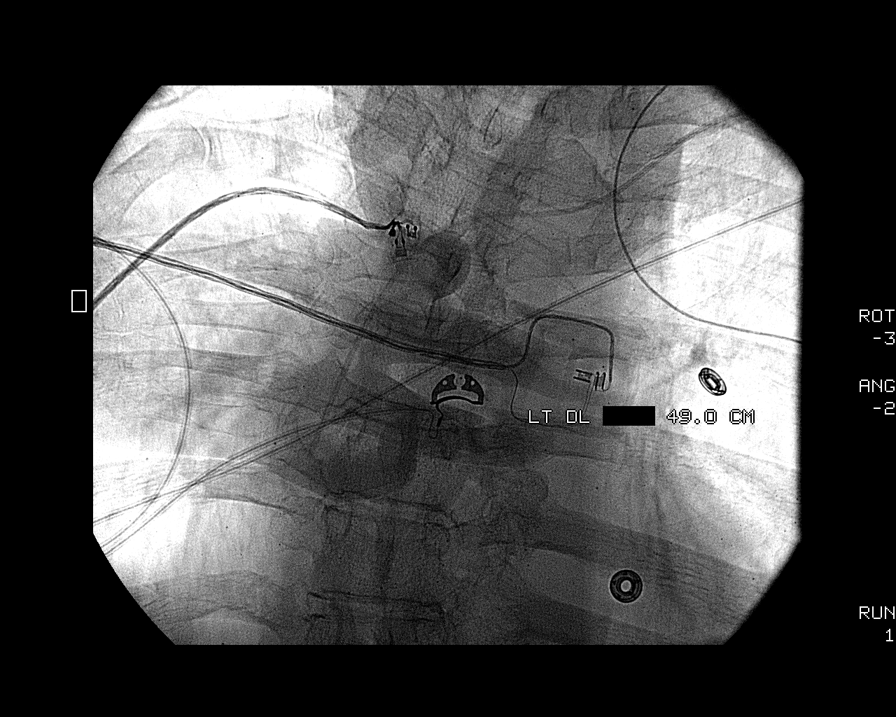
[im 6/6]
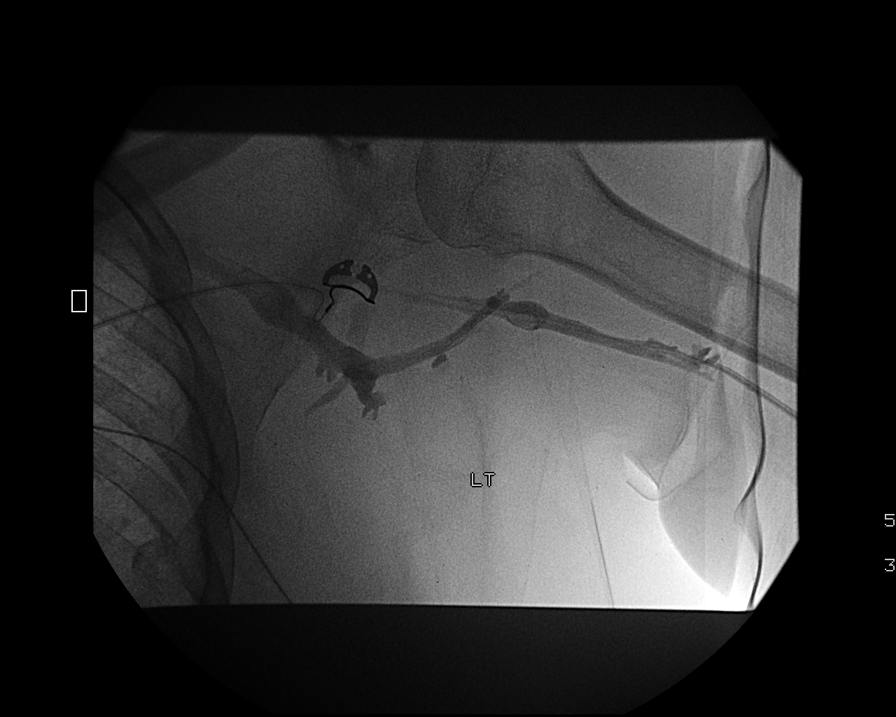

[6 of 6 positions shown; findings below may reference images not displayed]

The left arm was prepped with chlorhexidine, draped in the usual
sterile fashion using maximum barrier technique (cap and mask,
sterile gown, sterile gloves, large sterile sheet, hand hygiene and
cutaneous antiseptic). Local anesthesia was attained by infiltration
with 1% lidocaine.

Ultrasound demonstrated patency of the left brachial vein, and this
was documented with an image. Under real-time ultrasound guidance,
this vein was accessed with a 21 gauge micropuncture needle and
image documentation was performed. The needle was exchanged over a
guidewire for a peel-away sheath. A 49 cm dual lumen Power PICC line
was advanced through the sheath but would not easily advance beyond
the axilla. Therefore, contrast was injected to visualize the venous
anatomy. Eventually, catheter was advanced central to the left
axillary vein by using a micro wire through each lumen. Catheter tip
was eventually placed at the superior cavoatrial junction. Catheter
was sutured to the skin.
FINDINGS: There is a patent left brachial vein. There appears to be
stenosis at the left axillary line likely related to prior PICC
lines. Catheter tip at the superior cavoatrial junction.

Estimated blood loss: Minimal

CONTRAST:  5 mL Omnipaque 300

COMPLICATIONS:
None.  The patient tolerated the procedure well.
IMPRESSION: Successful placement of a left PICC line with ultrasound and
fluoroscopic guidance.

PICC line placement was successful but the placement was difficult
due to narrowing in the left axillary vein and likely related to
prior line placements. Placement of a left arm PICC line in the
future may be difficult.

## 2017-10-29 IMAGING — CR DG ABDOMEN 1V
1 series · 1 of 1 positions shown · non-contrast
Comparison: CT 03/16/2015.  KUB 03/15/2015.

CLINICAL DATA: Constipation.

EXAM:
ABDOMEN - 1 VIEW

[abdomen kub]
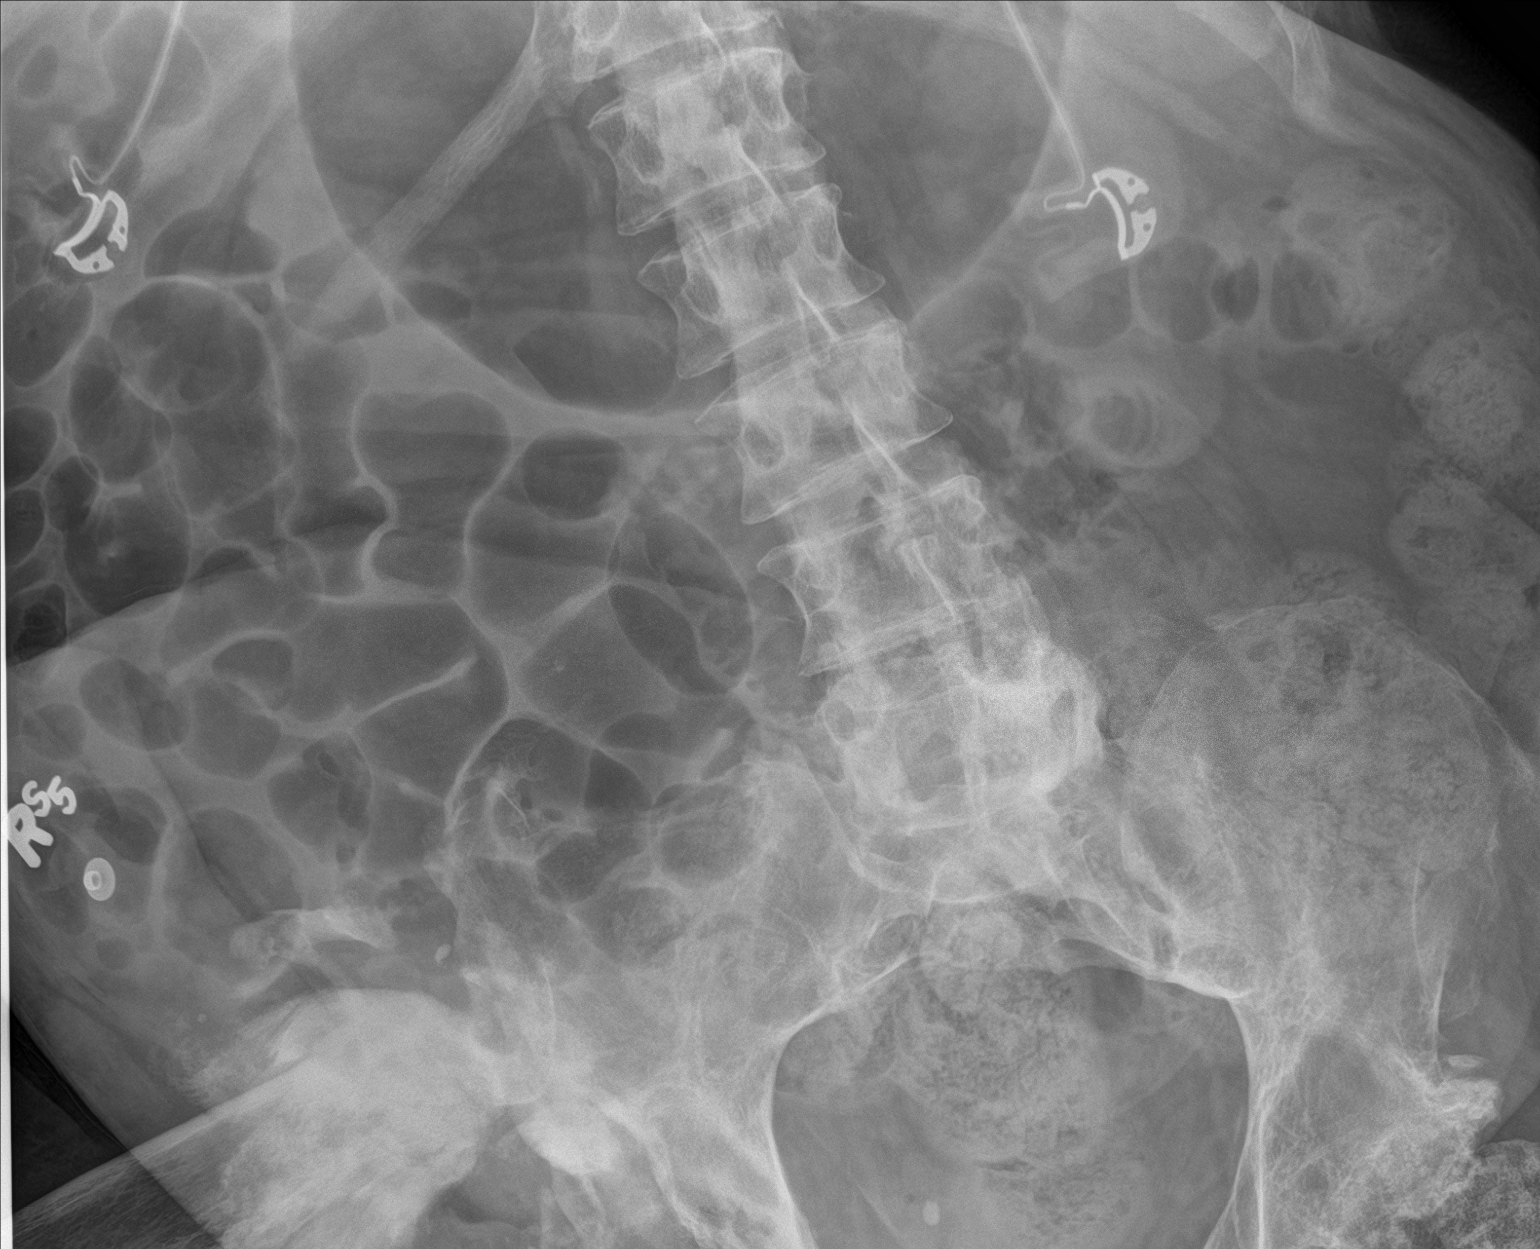

[1 of 1 positions shown; findings below may reference images not displayed]

FINDINGS: Prominent amount of stool remains in the left colon and
rectosigmoid. Mild small large bowel distention. Adynamic ileus
cannot be excluded. Moderate gastric distention. Degenerative
changes lumbar spine. Deformity of the hips again noted.
IMPRESSION: 1. Prominent stool remains in the left colon and rectosigmoid
suggesting constipation.
2. Slightly prominent loops of small and large bowel. Adynamic ileus
cannot be excluded.
3. Mild gastric distention.

## 2017-10-30 ENCOUNTER — Observation Stay (HOSPITAL_COMMUNITY)
Admission: EM | Admit: 2017-10-30 | Discharge: 2017-11-01 | Disposition: A | Discharge status: Home / Self Care | Payer: Medicare Other | Attending: Internal Medicine | Admitting: Internal Medicine

## 2017-10-30 ENCOUNTER — Emergency Department (HOSPITAL_COMMUNITY): Payer: Medicare Other

## 2017-10-30 ENCOUNTER — Encounter (HOSPITAL_COMMUNITY): Payer: Self-pay

## 2017-10-30 ENCOUNTER — Other Ambulatory Visit: Payer: Self-pay

## 2017-10-30 DIAGNOSIS — Z87440 Personal history of urinary (tract) infections: Secondary | ICD-10-CM | POA: Diagnosis not present

## 2017-10-30 DIAGNOSIS — F329 Major depressive disorder, single episode, unspecified: Secondary | ICD-10-CM | POA: Diagnosis not present

## 2017-10-30 DIAGNOSIS — K3184 Gastroparesis: Secondary | ICD-10-CM | POA: Diagnosis not present

## 2017-10-30 DIAGNOSIS — R1111 Vomiting without nausea: Secondary | ICD-10-CM

## 2017-10-30 DIAGNOSIS — R112 Nausea with vomiting, unspecified: Secondary | ICD-10-CM | POA: Diagnosis not present

## 2017-10-30 DIAGNOSIS — Z9359 Other cystostomy status: Secondary | ICD-10-CM | POA: Insufficient documentation

## 2017-10-30 DIAGNOSIS — Z881 Allergy status to other antibiotic agents status: Secondary | ICD-10-CM | POA: Diagnosis not present

## 2017-10-30 DIAGNOSIS — N319 Neuromuscular dysfunction of bladder, unspecified: Secondary | ICD-10-CM | POA: Diagnosis not present

## 2017-10-30 DIAGNOSIS — R1110 Vomiting, unspecified: Secondary | ICD-10-CM | POA: Diagnosis present

## 2017-10-30 DIAGNOSIS — K219 Gastro-esophageal reflux disease without esophagitis: Secondary | ICD-10-CM | POA: Insufficient documentation

## 2017-10-30 DIAGNOSIS — N281 Cyst of kidney, acquired: Secondary | ICD-10-CM | POA: Diagnosis not present

## 2017-10-30 DIAGNOSIS — R197 Diarrhea, unspecified: Secondary | ICD-10-CM | POA: Diagnosis not present

## 2017-10-30 DIAGNOSIS — Z8719 Personal history of other diseases of the digestive system: Secondary | ICD-10-CM | POA: Insufficient documentation

## 2017-10-30 DIAGNOSIS — L89154 Pressure ulcer of sacral region, stage 4: Secondary | ICD-10-CM | POA: Diagnosis not present

## 2017-10-30 DIAGNOSIS — R109 Unspecified abdominal pain: Secondary | ICD-10-CM | POA: Diagnosis not present

## 2017-10-30 DIAGNOSIS — D638 Anemia in other chronic diseases classified elsewhere: Secondary | ICD-10-CM | POA: Insufficient documentation

## 2017-10-30 DIAGNOSIS — L89104 Pressure ulcer of unspecified part of back, stage 4: Secondary | ICD-10-CM | POA: Diagnosis not present

## 2017-10-30 DIAGNOSIS — Z933 Colostomy status: Secondary | ICD-10-CM | POA: Insufficient documentation

## 2017-10-30 DIAGNOSIS — G4733 Obstructive sleep apnea (adult) (pediatric): Secondary | ICD-10-CM | POA: Insufficient documentation

## 2017-10-30 DIAGNOSIS — L89613 Pressure ulcer of right heel, stage 3: Secondary | ICD-10-CM | POA: Diagnosis not present

## 2017-10-30 DIAGNOSIS — I10 Essential (primary) hypertension: Secondary | ICD-10-CM | POA: Insufficient documentation

## 2017-10-30 DIAGNOSIS — Z79899 Other long term (current) drug therapy: Secondary | ICD-10-CM | POA: Insufficient documentation

## 2017-10-30 DIAGNOSIS — N390 Urinary tract infection, site not specified: Secondary | ICD-10-CM | POA: Diagnosis not present

## 2017-10-30 DIAGNOSIS — G8250 Quadriplegia, unspecified: Secondary | ICD-10-CM | POA: Diagnosis not present

## 2017-10-30 DIAGNOSIS — L97513 Non-pressure chronic ulcer of other part of right foot with necrosis of muscle: Secondary | ICD-10-CM | POA: Diagnosis not present

## 2017-10-30 LAB — CBC
HCT: 32 % — ABNORMAL LOW (ref 39.0–52.0)
HEMOGLOBIN: 9.7 g/dL — AB (ref 13.0–17.0)
MCH: 23.9 pg — ABNORMAL LOW (ref 26.0–34.0)
MCHC: 30.3 g/dL (ref 30.0–36.0)
MCV: 78.8 fL (ref 78.0–100.0)
PLATELETS: 325 10*3/uL (ref 150–400)
RBC: 4.06 MIL/uL — ABNORMAL LOW (ref 4.22–5.81)
RDW: 16.9 % — AB (ref 11.5–15.5)
WBC: 13.2 10*3/uL — ABNORMAL HIGH (ref 4.0–10.5)

## 2017-10-30 LAB — COMPREHENSIVE METABOLIC PANEL
ALBUMIN: 3.1 g/dL — AB (ref 3.5–5.0)
ALK PHOS: 88 U/L (ref 38–126)
ALT: 16 U/L — ABNORMAL LOW (ref 17–63)
ANION GAP: 5 (ref 5–15)
AST: 17 U/L (ref 15–41)
BUN: 29 mg/dL — ABNORMAL HIGH (ref 6–20)
CALCIUM: 8.8 mg/dL — AB (ref 8.9–10.3)
CHLORIDE: 110 mmol/L (ref 101–111)
CO2: 23 mmol/L (ref 22–32)
Creatinine, Ser: 0.47 mg/dL — ABNORMAL LOW (ref 0.61–1.24)
GFR calc non Af Amer: >60 mL/min (ref >60)
GLUCOSE: 127 mg/dL — AB (ref 65–99)
POTASSIUM: 4.4 mmol/L (ref 3.5–5.1)
SODIUM: 138 mmol/L (ref 135–145)
Total Bilirubin: 0.5 mg/dL (ref 0.3–1.2)
Total Protein: 8.4 g/dL — ABNORMAL HIGH (ref 6.5–8.1)

## 2017-10-30 LAB — LIPASE, BLOOD: Lipase: 40 U/L (ref 11–51)

## 2017-10-30 MED ORDER — ONDANSETRON HCL 4 MG/2ML IJ SOLN
4.0000 mg | Freq: Four times a day (QID) | INTRAMUSCULAR | Status: DC | PRN
  Administered 2017-10-31: 4 mg via INTRAVENOUS
  Filled 2017-10-30: qty 2

## 2017-10-30 MED ORDER — IOPAMIDOL (ISOVUE-300) INJECTION 61%
100.0000 mL | Freq: Once | INTRAVENOUS | Status: AC | PRN
  Administered 2017-10-30: 100 mL via INTRAVENOUS

## 2017-10-30 MED ORDER — SODIUM CHLORIDE 0.9 % IV BOLUS
1000.0000 mL | Freq: Once | INTRAVENOUS | Status: AC
  Administered 2017-10-30: 1000 mL via INTRAVENOUS

## 2017-10-30 MED ORDER — IOPAMIDOL (ISOVUE-300) INJECTION 61%
INTRAVENOUS | Status: AC
  Filled 2017-10-30: qty 100

## 2017-10-30 MED ORDER — METOCLOPRAMIDE HCL 5 MG/ML IJ SOLN
10.0000 mg | Freq: Once | INTRAMUSCULAR | Status: AC
  Administered 2017-10-30: 10 mg via INTRAVENOUS
  Filled 2017-10-30: qty 2

## 2017-10-30 MED ORDER — ONDANSETRON HCL 4 MG/2ML IJ SOLN
4.0000 mg | Freq: Once | INTRAMUSCULAR | Status: AC
  Administered 2017-10-30: 4 mg via INTRAVENOUS
  Filled 2017-10-30: qty 2

## 2017-10-30 MED ORDER — PROMETHAZINE HCL 25 MG/ML IJ SOLN
25.0000 mg | Freq: Once | INTRAMUSCULAR | Status: AC
  Administered 2017-10-30: 25 mg via INTRAVENOUS
  Filled 2017-10-30: qty 1

## 2017-10-30 MED ORDER — MORPHINE SULFATE (PF) 4 MG/ML IV SOLN
4.0000 mg | Freq: Once | INTRAVENOUS | Status: AC
  Administered 2017-10-30: 4 mg via INTRAVENOUS
  Filled 2017-10-30: qty 1

## 2017-10-30 MED ORDER — METOCLOPRAMIDE HCL 5 MG/ML IJ SOLN
10.0000 mg | Freq: Four times a day (QID) | INTRAMUSCULAR | Status: DC
  Administered 2017-10-30 – 2017-10-31 (×4): 10 mg via INTRAVENOUS
  Filled 2017-10-30 (×4): qty 2

## 2017-10-30 MED ORDER — FAMOTIDINE IN NACL 20-0.9 MG/50ML-% IV SOLN
20.0000 mg | Freq: Two times a day (BID) | INTRAVENOUS | Status: DC
  Administered 2017-10-30 – 2017-10-31 (×2): 20 mg via INTRAVENOUS
  Filled 2017-10-30 (×3): qty 50

## 2017-10-30 MED ORDER — PROMETHAZINE HCL 25 MG PO TABS: 12.5000 mg | ORAL_TABLET | Freq: Four times a day (QID) | ORAL | Status: DC | PRN

## 2017-10-30 MED ORDER — ENOXAPARIN SODIUM 40 MG/0.4ML ~~LOC~~ SOLN
40.0000 mg | SUBCUTANEOUS | Status: DC
  Administered 2017-10-30 – 2017-10-31 (×2): 40 mg via SUBCUTANEOUS
  Filled 2017-10-30 (×2): qty 0.4

## 2017-10-30 MED ORDER — METHOCARBAMOL 1000 MG/10ML IJ SOLN
500.0000 mg | Freq: Three times a day (TID) | INTRAVENOUS | Status: DC
  Administered 2017-10-30 – 2017-10-31 (×3): 500 mg via INTRAVENOUS
  Filled 2017-10-30: qty 5
  Filled 2017-10-30 (×3): qty 550
  Filled 2017-10-30 (×3): qty 5
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
## 2017-10-30 NOTE — ED Notes (Signed)
Pt had an episode of brownish emesis.  Dr. Venora Maples made aware.

## 2017-10-30 NOTE — ED Notes (Signed)
Bed: WA19 Expected date:  Expected time:  Means of arrival:  Comments: EMS-abdominal pain 

## 2017-10-30 NOTE — ED Triage Notes (Signed)
Pt arrives via EMS from home. Pt reports that he has had N/V/D for 5 days and abd pain beginning last night.
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
## 2017-10-31 ENCOUNTER — Other Ambulatory Visit: Payer: Self-pay

## 2017-10-31 DIAGNOSIS — D638 Anemia in other chronic diseases classified elsewhere: Secondary | ICD-10-CM | POA: Diagnosis not present

## 2017-10-31 DIAGNOSIS — K3184 Gastroparesis: Secondary | ICD-10-CM | POA: Diagnosis not present

## 2017-10-31 DIAGNOSIS — L89154 Pressure ulcer of sacral region, stage 4: Secondary | ICD-10-CM | POA: Diagnosis not present

## 2017-10-31 DIAGNOSIS — R1111 Vomiting without nausea: Secondary | ICD-10-CM | POA: Diagnosis not present

## 2017-10-31 LAB — MRSA PCR SCREENING: MRSA by PCR: POSITIVE — AB

## 2017-10-31 LAB — COMPREHENSIVE METABOLIC PANEL
ALT: 14 U/L — ABNORMAL LOW (ref 17–63)
ANION GAP: 6 (ref 5–15)
AST: 14 U/L — ABNORMAL LOW (ref 15–41)
Albumin: 2.8 g/dL — ABNORMAL LOW (ref 3.5–5.0)
Alkaline Phosphatase: 69 U/L (ref 38–126)
BILIRUBIN TOTAL: 0.3 mg/dL (ref 0.3–1.2)
BUN: 24 mg/dL — ABNORMAL HIGH (ref 6–20)
CO2: 23 mmol/L (ref 22–32)
Calcium: 8.6 mg/dL — ABNORMAL LOW (ref 8.9–10.3)
Chloride: 112 mmol/L — ABNORMAL HIGH (ref 101–111)
Creatinine, Ser: 0.49 mg/dL — ABNORMAL LOW (ref 0.61–1.24)
GFR calc non Af Amer: >60 mL/min (ref >60)
Glucose, Bld: 88 mg/dL (ref 65–99)
Potassium: 3.9 mmol/L (ref 3.5–5.1)
SODIUM: 141 mmol/L (ref 135–145)
TOTAL PROTEIN: 7.4 g/dL (ref 6.5–8.1)

## 2017-10-31 LAB — URINALYSIS, ROUTINE W REFLEX MICROSCOPIC
Bilirubin Urine: NEGATIVE
GLUCOSE, UA: NEGATIVE mg/dL
HGB URINE DIPSTICK: NEGATIVE
KETONES UR: NEGATIVE mg/dL
NITRITE: NEGATIVE
PROTEIN: NEGATIVE mg/dL
Specific Gravity, Urine: 1.03 (ref 1.005–1.030)
pH: 6 (ref 5.0–8.0)

## 2017-10-31 LAB — CBC
HCT: 28.6 % — ABNORMAL LOW (ref 39.0–52.0)
HEMOGLOBIN: 8.5 g/dL — AB (ref 13.0–17.0)
MCH: 23.7 pg — ABNORMAL LOW (ref 26.0–34.0)
MCHC: 29.7 g/dL — ABNORMAL LOW (ref 30.0–36.0)
MCV: 79.9 fL (ref 78.0–100.0)
Platelets: 293 10*3/uL (ref 150–400)
RBC: 3.58 MIL/uL — AB (ref 4.22–5.81)
RDW: 17.2 % — ABNORMAL HIGH (ref 11.5–15.5)
WBC: 9.3 10*3/uL (ref 4.0–10.5)

## 2017-10-31 MED ORDER — JUVEN PO PACK
1.0000 | PACK | Freq: Two times a day (BID) | ORAL | Status: DC
  Administered 2017-10-31 – 2017-11-01 (×3): 1 via ORAL
  Filled 2017-10-31 (×3): qty 1

## 2017-10-31 MED ORDER — SODIUM CHLORIDE 0.9% FLUSH: 10.0000 mL | INTRAVENOUS | Status: DC | PRN

## 2017-10-31 MED ORDER — ONDANSETRON HCL 4 MG PO TABS: 4.0000 mg | ORAL_TABLET | Freq: Three times a day (TID) | ORAL | 0 refills | Status: DC | PRN

## 2017-10-31 MED ORDER — FAMOTIDINE 20 MG PO TABS: 20.0000 mg | ORAL_TABLET | Freq: Two times a day (BID) | ORAL | 0 refills | Status: DC

## 2017-10-31 NOTE — Progress Notes (Signed)
PTAR has been cancelled until tomrrow. Pt states  His sister will not answer the door after 11 pm. This Probation officer and pt attempted to call her but no answer. AC informed. advised to cancel PTAR.
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
## 2017-10-31 NOTE — Care Management Note (Deleted)
Case Management Note  Patient Details  Name: AVANT PRINTY MRN: [id number] Date of Birth: [birth year]  Subjective/Objective:                  Discharge hhc Action/Plan: [facility] to do services rn and aide, honme by ptar  Expected Discharge Date:  10/31/17               Expected Discharge Plan:  [place]  In-House Referral:     Discharge planning Services  CM Consult  Post Acute Care Choice:    Choice offered to:  Patient  DME Arranged:    DME Agency:     HH Arranged:    [place] Agency:     Status of Service:  Completed, signed off  If discussed at [employer], dates discussed:    Additional Comments:  Leeroy Cha, RN 10/31/2017, 5:17 PM

## 2017-10-31 NOTE — Progress Notes (Signed)
Pt assessment completed, pt port deaccessed, placed in paper gown and awaiting PTAR. No complaint of pain will continue to monitor.
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
## 2017-11-01 DIAGNOSIS — L89119 Pressure ulcer of right upper back, unspecified stage: Secondary | ICD-10-CM | POA: Diagnosis not present

## 2017-11-01 DIAGNOSIS — Z7401 Bed confinement status: Secondary | ICD-10-CM | POA: Diagnosis not present

## 2017-11-01 DIAGNOSIS — G8250 Quadriplegia, unspecified: Secondary | ICD-10-CM | POA: Diagnosis not present

## 2017-11-01 DIAGNOSIS — L89159 Pressure ulcer of sacral region, unspecified stage: Secondary | ICD-10-CM | POA: Diagnosis not present

## 2017-11-01 DIAGNOSIS — L89154 Pressure ulcer of sacral region, stage 4: Secondary | ICD-10-CM | POA: Diagnosis not present

## 2017-11-01 DIAGNOSIS — M2550 Pain in unspecified joint: Secondary | ICD-10-CM | POA: Diagnosis not present

## 2017-11-01 MED ORDER — METHOCARBAMOL 500 MG PO TABS
500.0000 mg | ORAL_TABLET | Freq: Three times a day (TID) | ORAL | Status: DC | PRN
  Administered 2017-11-01: 500 mg via ORAL
  Filled 2017-11-01: qty 1

## 2017-11-01 MED ORDER — FAMOTIDINE 20 MG PO TABS
20.0000 mg | ORAL_TABLET | Freq: Two times a day (BID) | ORAL | Status: DC
  Administered 2017-11-01: 20 mg via ORAL
  Filled 2017-11-01: qty 1

## 2017-11-01 NOTE — Care Management Note (Addendum)
Case Management Note  Patient Details  Name: LUX MEADERS MRN: [id number] Date of Birth: 11/27/1965  Subjective/Objective:     hhc with discharge    /transportation to home           Action/Plan: [facility] is aware of discharge and will see patient later today. Ptar will pick up patient at 4pm today for transport home/sister will be at the house. Expected Discharge Date:  10/31/17               Expected Discharge Plan:  [place]  In-House Referral:     Discharge planning Services  CM Consult  Post Acute Care Choice:    Choice offered to:  Patient  DME Arranged:    DME Agency:     HH Arranged:  RN [place] Agency:  [place]  Status of Service:  Completed, signed off  If discussed at [place] of Stay Meetings, dates discussed:    Additional Comments:  Leeroy Cha, RN 11/01/2017, 8:53 AM

## 2017-11-01 NOTE — Care Management Obs Status (Signed)
[facility] NOTIFICATION   Patient Details  Name: [name] [name] MRN: [id number] Date of Birth: 06-04-1965   Medicare Observation Status Notification Given:  Yes    Leeroy Cha, RN 11/01/2017, 9:43 AM

## 2017-11-01 NOTE — Progress Notes (Signed)
Patient was discharged yesterday by my colleague Dr. Quincy Simmonds and I have seen and examined this patient this morning and there are no acute changes overnight and no acute changes per nursing.  Patient is doing well and has complained of no more nausea or vomiting and is doing well.  He continues to be stable for discharge and the reason he was not discharged yesterday is because of transportation issues and will be arranged today.

## 2017-11-02 ENCOUNTER — Other Ambulatory Visit: Payer: Self-pay | Admitting: Hematology

## 2017-11-03 DIAGNOSIS — G8253 Quadriplegia, C5-C7 complete: Secondary | ICD-10-CM | POA: Diagnosis not present

## 2017-11-03 DIAGNOSIS — Z466 Encounter for fitting and adjustment of urinary device: Secondary | ICD-10-CM | POA: Diagnosis not present

## 2017-11-03 DIAGNOSIS — J189 Pneumonia, unspecified organism: Secondary | ICD-10-CM | POA: Diagnosis not present

## 2017-11-03 DIAGNOSIS — L89114 Pressure ulcer of right upper back, stage 4: Secondary | ICD-10-CM | POA: Diagnosis not present

## 2017-11-03 DIAGNOSIS — L89154 Pressure ulcer of sacral region, stage 4: Secondary | ICD-10-CM | POA: Diagnosis not present

## 2017-11-03 DIAGNOSIS — F329 Major depressive disorder, single episode, unspecified: Secondary | ICD-10-CM | POA: Diagnosis not present

## 2017-11-03 DIAGNOSIS — Z435 Encounter for attention to cystostomy: Secondary | ICD-10-CM | POA: Diagnosis not present

## 2017-11-03 DIAGNOSIS — E662 Morbid (severe) obesity with alveolar hypoventilation: Secondary | ICD-10-CM | POA: Diagnosis not present

## 2017-11-03 DIAGNOSIS — D509 Iron deficiency anemia, unspecified: Secondary | ICD-10-CM | POA: Diagnosis not present

## 2017-11-03 DIAGNOSIS — I10 Essential (primary) hypertension: Secondary | ICD-10-CM | POA: Diagnosis not present

## 2017-11-03 DIAGNOSIS — G4733 Obstructive sleep apnea (adult) (pediatric): Secondary | ICD-10-CM | POA: Diagnosis not present

## 2017-11-03 DIAGNOSIS — N319 Neuromuscular dysfunction of bladder, unspecified: Secondary | ICD-10-CM | POA: Diagnosis not present

## 2017-11-03 DIAGNOSIS — L89214 Pressure ulcer of right hip, stage 4: Secondary | ICD-10-CM | POA: Diagnosis not present

## 2017-11-03 DIAGNOSIS — K219 Gastro-esophageal reflux disease without esophagitis: Secondary | ICD-10-CM | POA: Diagnosis not present

## 2017-11-03 DIAGNOSIS — N390 Urinary tract infection, site not specified: Secondary | ICD-10-CM | POA: Diagnosis not present

## 2017-11-03 DIAGNOSIS — I739 Peripheral vascular disease, unspecified: Secondary | ICD-10-CM | POA: Diagnosis not present

## 2017-11-04 ENCOUNTER — Emergency Department (HOSPITAL_COMMUNITY)
Admission: EM | Admit: 2017-11-04 | Discharge: 2017-11-04 | Disposition: A | Discharge status: Home / Self Care | Payer: Medicare Other | Attending: Emergency Medicine | Admitting: Emergency Medicine

## 2017-11-04 ENCOUNTER — Other Ambulatory Visit: Payer: Self-pay

## 2017-11-04 ENCOUNTER — Encounter (HOSPITAL_COMMUNITY): Payer: Self-pay

## 2017-11-04 DIAGNOSIS — I10 Essential (primary) hypertension: Secondary | ICD-10-CM | POA: Insufficient documentation

## 2017-11-04 DIAGNOSIS — M2650 Dentofacial functional abnormalities, unspecified: Secondary | ICD-10-CM | POA: Diagnosis not present

## 2017-11-04 DIAGNOSIS — Z79899 Other long term (current) drug therapy: Secondary | ICD-10-CM | POA: Diagnosis not present

## 2017-11-04 DIAGNOSIS — R82998 Other abnormal findings in urine: Secondary | ICD-10-CM | POA: Diagnosis present

## 2017-11-04 DIAGNOSIS — T83510A Infection and inflammatory reaction due to cystostomy catheter, initial encounter: Secondary | ICD-10-CM

## 2017-11-04 DIAGNOSIS — R112 Nausea with vomiting, unspecified: Secondary | ICD-10-CM | POA: Diagnosis not present

## 2017-11-04 DIAGNOSIS — N390 Urinary tract infection, site not specified: Secondary | ICD-10-CM

## 2017-11-04 DIAGNOSIS — Z7401 Bed confinement status: Secondary | ICD-10-CM | POA: Diagnosis not present

## 2017-11-04 DIAGNOSIS — R110 Nausea: Secondary | ICD-10-CM

## 2017-11-04 DIAGNOSIS — R031 Nonspecific low blood-pressure reading: Secondary | ICD-10-CM | POA: Diagnosis not present

## 2017-11-04 LAB — URINALYSIS, ROUTINE W REFLEX MICROSCOPIC
BILIRUBIN URINE: NEGATIVE
GLUCOSE, UA: NEGATIVE mg/dL
KETONES UR: NEGATIVE mg/dL
NITRITE: NEGATIVE
PH: 6 (ref 5.0–8.0)
PROTEIN: NEGATIVE mg/dL
Specific Gravity, Urine: 1.013 (ref 1.005–1.030)

## 2017-11-04 LAB — CBC WITH DIFFERENTIAL/PLATELET
BASOS PCT: 0 %
Basophils Absolute: 0 10*3/uL (ref 0.0–0.1)
Eosinophils Absolute: 0.1 10*3/uL (ref 0.0–0.7)
Eosinophils Relative: 1 %
HEMATOCRIT: 28.9 % — AB (ref 39.0–52.0)
HEMOGLOBIN: 8.8 g/dL — AB (ref 13.0–17.0)
LYMPHS ABS: 1 10*3/uL (ref 0.7–4.0)
LYMPHS PCT: 11 %
MCH: 23.8 pg — ABNORMAL LOW (ref 26.0–34.0)
MCHC: 30.4 g/dL (ref 30.0–36.0)
MCV: 78.3 fL (ref 78.0–100.0)
MONO ABS: 0.8 10*3/uL (ref 0.1–1.0)
MONOS PCT: 9 %
NEUTROS ABS: 7.4 10*3/uL (ref 1.7–7.7)
NEUTROS PCT: 79 %
Platelets: 342 10*3/uL (ref 150–400)
RBC: 3.69 MIL/uL — ABNORMAL LOW (ref 4.22–5.81)
RDW: 16.8 % — AB (ref 11.5–15.5)
WBC: 9.3 10*3/uL (ref 4.0–10.5)

## 2017-11-04 LAB — BASIC METABOLIC PANEL
Anion gap: 6 (ref 5–15)
BUN: 15 mg/dL (ref 6–20)
CALCIUM: 8.7 mg/dL — AB (ref 8.9–10.3)
CHLORIDE: 110 mmol/L (ref 101–111)
CO2: 24 mmol/L (ref 22–32)
CREATININE: 0.36 mg/dL — AB (ref 0.61–1.24)
GFR calc non Af Amer: >60 mL/min (ref >60)
Glucose, Bld: 109 mg/dL — ABNORMAL HIGH (ref 65–99)
Potassium: 3.7 mmol/L (ref 3.5–5.1)
Sodium: 140 mmol/L (ref 135–145)

## 2017-11-04 MED ORDER — SODIUM CHLORIDE 0.9 % IV BOLUS
1000.0000 mL | Freq: Once | INTRAVENOUS | Status: AC
Start: 2017-11-04 — End: 2017-11-04
  Administered 2017-11-04: 1000 mL via INTRAVENOUS

## 2017-11-04 MED ORDER — SODIUM CHLORIDE 0.9 % IV SOLN
1.0000 g | Freq: Once | INTRAVENOUS | Status: AC
  Administered 2017-11-04: 1 g via INTRAVENOUS
  Filled 2017-11-04: qty 10

## 2017-11-04 MED ORDER — PROMETHAZINE HCL 25 MG/ML IJ SOLN
25.0000 mg | Freq: Once | INTRAMUSCULAR | Status: AC
  Administered 2017-11-04: 25 mg via INTRAVENOUS
  Filled 2017-11-04: qty 1

## 2017-11-04 MED ORDER — ONDANSETRON 8 MG PO TBDP
8.0000 mg | ORAL_TABLET | Freq: Once | ORAL | Status: AC
  Administered 2017-11-04: 8 mg via ORAL
  Filled 2017-11-04: qty 1

## 2017-11-04 MED ORDER — PROMETHAZINE HCL 25 MG/ML IJ SOLN
25.0000 mg | Freq: Once | INTRAMUSCULAR | Status: DC
  Filled 2017-11-04: qty 1

## 2017-11-04 MED ORDER — METOCLOPRAMIDE HCL 5 MG/ML IJ SOLN: 10.0000 mg | Freq: Once | INTRAMUSCULAR | Status: DC

## 2017-11-04 MED ORDER — PROMETHAZINE HCL 25 MG PO TABS: 25.0000 mg | ORAL_TABLET | Freq: Four times a day (QID) | ORAL | 0 refills | Status: DC | PRN

## 2017-11-04 MED ORDER — CEPHALEXIN 500 MG PO CAPS
500.0000 mg | ORAL_CAPSULE | Freq: Four times a day (QID) | ORAL | 0 refills | Status: DC
Start: 2017-11-04 — End: 2017-11-17

## 2017-11-04 NOTE — Progress Notes (Addendum)
Consult request has been received. CSW attempting to follow up at present time.  CSW received verbal permission from pt to speak to his sister who is, per the consult, requesting that the pt be placed.  CSW spoke to pt's sister [name] at ph: [phone] who voiced complaints with the pt care when the pt was previously admitted and D/C'd on 10/30/17. Specifically pt's sister stated pt was not bathed properly at D/C.  CSW provided the pt's sister with the number of the [facility] at ph: [phone].  CSW reviewed pt's care with his sister while at the ED and stated pt was ready for D/C.  Pt's sister voiced understanding, is in agreement with the pt returning home, stated pt has [facility] services in place, and is appreciative and thanked the [place].  Please reconsult if future social work needs arise.  CSW signing off, as social work intervention is no longer needed.  [facility] ED CN updated and will update the pt's RN.    Alphonse Guild. Peniel Biel, LCSW, LCAS, CSI Clinical Social Worker Ph: [phone]

## 2017-11-04 NOTE — ED Triage Notes (Signed)
Per [facility], pt arrives today due to complaints of foul smelling odor. He also c/o nausea, Zofran providing no relief. Pt has a suprapubic catheter and a hx of frequent UTI's.

## 2017-11-04 NOTE — ED Notes (Signed)
Bed: PH43 Expected date:  Expected time:  Means of arrival:  Comments: 81 M foul smelling urine

## 2017-11-04 NOTE — ED Notes (Signed)
Pt given a few sips of ice water. He states it does not make his stomach feel good when he drink it and that he still feels nauseous. No emesis occurred.

## 2017-11-04 NOTE — ED Notes (Signed)
Wound care performed. New dressings applied.
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
## 2017-11-04 NOTE — ED Notes (Signed)
Social Worker speaking with pt regarding discharge.

## 2017-11-07 LAB — URINE CULTURE

## 2017-11-09 DIAGNOSIS — I10 Essential (primary) hypertension: Secondary | ICD-10-CM | POA: Diagnosis not present

## 2017-11-09 DIAGNOSIS — L89893 Pressure ulcer of other site, stage 3: Secondary | ICD-10-CM | POA: Diagnosis not present

## 2017-11-09 DIAGNOSIS — L89154 Pressure ulcer of sacral region, stage 4: Secondary | ICD-10-CM | POA: Diagnosis not present

## 2017-11-09 DIAGNOSIS — L89323 Pressure ulcer of left buttock, stage 3: Secondary | ICD-10-CM | POA: Diagnosis not present

## 2017-11-09 DIAGNOSIS — G4730 Sleep apnea, unspecified: Secondary | ICD-10-CM | POA: Diagnosis not present

## 2017-11-09 DIAGNOSIS — L89213 Pressure ulcer of right hip, stage 3: Secondary | ICD-10-CM | POA: Diagnosis not present

## 2017-11-09 DIAGNOSIS — L89894 Pressure ulcer of other site, stage 4: Secondary | ICD-10-CM | POA: Diagnosis not present

## 2017-11-09 DIAGNOSIS — L89512 Pressure ulcer of right ankle, stage 2: Secondary | ICD-10-CM | POA: Diagnosis not present

## 2017-11-09 DIAGNOSIS — G8250 Quadriplegia, unspecified: Secondary | ICD-10-CM | POA: Diagnosis not present

## 2017-11-13 HISTORY — PX: LAPAROSCOPIC CHOLECYSTECTOMY: SUR755

## 2017-11-15 DIAGNOSIS — L89154 Pressure ulcer of sacral region, stage 4: Secondary | ICD-10-CM | POA: Diagnosis not present

## 2017-11-15 DIAGNOSIS — Z466 Encounter for fitting and adjustment of urinary device: Secondary | ICD-10-CM | POA: Diagnosis not present

## 2017-11-15 DIAGNOSIS — N319 Neuromuscular dysfunction of bladder, unspecified: Secondary | ICD-10-CM | POA: Diagnosis not present

## 2017-11-15 DIAGNOSIS — G8253 Quadriplegia, C5-C7 complete: Secondary | ICD-10-CM | POA: Diagnosis not present

## 2017-11-15 DIAGNOSIS — E662 Morbid (severe) obesity with alveolar hypoventilation: Secondary | ICD-10-CM | POA: Diagnosis not present

## 2017-11-15 DIAGNOSIS — K219 Gastro-esophageal reflux disease without esophagitis: Secondary | ICD-10-CM | POA: Diagnosis not present

## 2017-11-15 DIAGNOSIS — D509 Iron deficiency anemia, unspecified: Secondary | ICD-10-CM | POA: Diagnosis not present

## 2017-11-15 DIAGNOSIS — L89114 Pressure ulcer of right upper back, stage 4: Secondary | ICD-10-CM | POA: Diagnosis not present

## 2017-11-15 DIAGNOSIS — F329 Major depressive disorder, single episode, unspecified: Secondary | ICD-10-CM | POA: Diagnosis not present

## 2017-11-15 DIAGNOSIS — N390 Urinary tract infection, site not specified: Secondary | ICD-10-CM | POA: Diagnosis not present

## 2017-11-15 DIAGNOSIS — G4733 Obstructive sleep apnea (adult) (pediatric): Secondary | ICD-10-CM | POA: Diagnosis not present

## 2017-11-15 DIAGNOSIS — I10 Essential (primary) hypertension: Secondary | ICD-10-CM | POA: Diagnosis not present

## 2017-11-15 DIAGNOSIS — Z435 Encounter for attention to cystostomy: Secondary | ICD-10-CM | POA: Diagnosis not present

## 2017-11-15 DIAGNOSIS — J189 Pneumonia, unspecified organism: Secondary | ICD-10-CM | POA: Diagnosis not present

## 2017-11-15 DIAGNOSIS — L89214 Pressure ulcer of right hip, stage 4: Secondary | ICD-10-CM | POA: Diagnosis not present

## 2017-11-15 DIAGNOSIS — I739 Peripheral vascular disease, unspecified: Secondary | ICD-10-CM | POA: Diagnosis not present

## 2017-11-16 DIAGNOSIS — L89119 Pressure ulcer of right upper back, unspecified stage: Secondary | ICD-10-CM | POA: Diagnosis not present

## 2017-11-16 DIAGNOSIS — A419 Sepsis, unspecified organism: Secondary | ICD-10-CM | POA: Diagnosis not present

## 2017-11-16 DIAGNOSIS — L89159 Pressure ulcer of sacral region, unspecified stage: Secondary | ICD-10-CM | POA: Diagnosis not present

## 2017-11-16 DIAGNOSIS — G8250 Quadriplegia, unspecified: Secondary | ICD-10-CM | POA: Diagnosis not present

## 2017-11-17 ENCOUNTER — Encounter (HOSPITAL_COMMUNITY): Payer: Self-pay

## 2017-11-17 ENCOUNTER — Emergency Department (HOSPITAL_COMMUNITY): Payer: Medicare Other

## 2017-11-17 ENCOUNTER — Other Ambulatory Visit: Payer: Self-pay

## 2017-11-17 ENCOUNTER — Inpatient Hospital Stay (HOSPITAL_COMMUNITY)
Admission: EM | Admit: 2017-11-17 | Discharge: 2017-11-27 | DRG: 698 | Disposition: A | Discharge status: Home Health | Payer: Medicare Other | Attending: Family Medicine | Admitting: Family Medicine

## 2017-11-17 DIAGNOSIS — T83511A Infection and inflammatory reaction due to indwelling urethral catheter, initial encounter: Secondary | ICD-10-CM

## 2017-11-17 DIAGNOSIS — I739 Peripheral vascular disease, unspecified: Secondary | ICD-10-CM

## 2017-11-17 DIAGNOSIS — D473 Essential (hemorrhagic) thrombocythemia: Secondary | ICD-10-CM

## 2017-11-17 DIAGNOSIS — Y846 Urinary catheterization as the cause of abnormal reaction of the patient, or of later complication, without mention of misadventure at the time of the procedure: Secondary | ICD-10-CM | POA: Diagnosis present

## 2017-11-17 DIAGNOSIS — N319 Neuromuscular dysfunction of bladder, unspecified: Secondary | ICD-10-CM | POA: Diagnosis present

## 2017-11-17 DIAGNOSIS — L89154 Pressure ulcer of sacral region, stage 4: Secondary | ICD-10-CM | POA: Diagnosis present

## 2017-11-17 DIAGNOSIS — K3189 Other diseases of stomach and duodenum: Secondary | ICD-10-CM | POA: Diagnosis not present

## 2017-11-17 DIAGNOSIS — R600 Localized edema: Secondary | ICD-10-CM | POA: Diagnosis present

## 2017-11-17 DIAGNOSIS — K3184 Gastroparesis: Secondary | ICD-10-CM | POA: Diagnosis present

## 2017-11-17 DIAGNOSIS — D508 Other iron deficiency anemias: Secondary | ICD-10-CM

## 2017-11-17 DIAGNOSIS — E662 Morbid (severe) obesity with alveolar hypoventilation: Secondary | ICD-10-CM | POA: Diagnosis not present

## 2017-11-17 DIAGNOSIS — Z9989 Dependence on other enabling machines and devices: Secondary | ICD-10-CM

## 2017-11-17 DIAGNOSIS — R0602 Shortness of breath: Secondary | ICD-10-CM | POA: Diagnosis not present

## 2017-11-17 DIAGNOSIS — E876 Hypokalemia: Secondary | ICD-10-CM | POA: Diagnosis not present

## 2017-11-17 DIAGNOSIS — K209 Esophagitis, unspecified: Secondary | ICD-10-CM | POA: Diagnosis not present

## 2017-11-17 DIAGNOSIS — G8220 Paraplegia, unspecified: Secondary | ICD-10-CM | POA: Diagnosis not present

## 2017-11-17 DIAGNOSIS — R110 Nausea: Secondary | ICD-10-CM | POA: Diagnosis not present

## 2017-11-17 DIAGNOSIS — M4628 Osteomyelitis of vertebra, sacral and sacrococcygeal region: Secondary | ICD-10-CM | POA: Diagnosis not present

## 2017-11-17 DIAGNOSIS — T83518A Infection and inflammatory reaction due to other urinary catheter, initial encounter: Principal | ICD-10-CM | POA: Diagnosis present

## 2017-11-17 DIAGNOSIS — I10 Essential (primary) hypertension: Secondary | ICD-10-CM | POA: Diagnosis present

## 2017-11-17 DIAGNOSIS — K5900 Constipation, unspecified: Secondary | ICD-10-CM | POA: Diagnosis present

## 2017-11-17 DIAGNOSIS — Z933 Colostomy status: Secondary | ICD-10-CM | POA: Diagnosis not present

## 2017-11-17 DIAGNOSIS — D638 Anemia in other chronic diseases classified elsewhere: Secondary | ICD-10-CM | POA: Diagnosis present

## 2017-11-17 DIAGNOSIS — K922 Gastrointestinal hemorrhage, unspecified: Secondary | ICD-10-CM | POA: Diagnosis not present

## 2017-11-17 DIAGNOSIS — R112 Nausea with vomiting, unspecified: Secondary | ICD-10-CM | POA: Diagnosis not present

## 2017-11-17 DIAGNOSIS — K228 Other specified diseases of esophagus: Secondary | ICD-10-CM | POA: Diagnosis not present

## 2017-11-17 DIAGNOSIS — R509 Fever, unspecified: Secondary | ICD-10-CM | POA: Diagnosis not present

## 2017-11-17 DIAGNOSIS — R222 Localized swelling, mass and lump, trunk: Secondary | ICD-10-CM | POA: Diagnosis not present

## 2017-11-17 DIAGNOSIS — K2950 Unspecified chronic gastritis without bleeding: Secondary | ICD-10-CM | POA: Diagnosis not present

## 2017-11-17 DIAGNOSIS — R195 Other fecal abnormalities: Secondary | ICD-10-CM

## 2017-11-17 DIAGNOSIS — Z87440 Personal history of urinary (tract) infections: Secondary | ICD-10-CM

## 2017-11-17 DIAGNOSIS — M8668 Other chronic osteomyelitis, other site: Secondary | ICD-10-CM | POA: Diagnosis present

## 2017-11-17 DIAGNOSIS — G8250 Quadriplegia, unspecified: Secondary | ICD-10-CM | POA: Diagnosis present

## 2017-11-17 DIAGNOSIS — D500 Iron deficiency anemia secondary to blood loss (chronic): Secondary | ICD-10-CM | POA: Diagnosis not present

## 2017-11-17 DIAGNOSIS — R1111 Vomiting without nausea: Secondary | ICD-10-CM | POA: Diagnosis not present

## 2017-11-17 DIAGNOSIS — Z1211 Encounter for screening for malignant neoplasm of colon: Secondary | ICD-10-CM | POA: Diagnosis not present

## 2017-11-17 DIAGNOSIS — K210 Gastro-esophageal reflux disease with esophagitis: Secondary | ICD-10-CM | POA: Diagnosis present

## 2017-11-17 DIAGNOSIS — Z7401 Bed confinement status: Secondary | ICD-10-CM | POA: Diagnosis not present

## 2017-11-17 DIAGNOSIS — J9811 Atelectasis: Secondary | ICD-10-CM | POA: Diagnosis not present

## 2017-11-17 DIAGNOSIS — R05 Cough: Secondary | ICD-10-CM

## 2017-11-17 DIAGNOSIS — M4624 Osteomyelitis of vertebra, thoracic region: Secondary | ICD-10-CM | POA: Diagnosis present

## 2017-11-17 DIAGNOSIS — R1084 Generalized abdominal pain: Secondary | ICD-10-CM | POA: Diagnosis not present

## 2017-11-17 DIAGNOSIS — N390 Urinary tract infection, site not specified: Secondary | ICD-10-CM | POA: Diagnosis not present

## 2017-11-17 DIAGNOSIS — L89890 Pressure ulcer of other site, unstageable: Secondary | ICD-10-CM | POA: Diagnosis present

## 2017-11-17 DIAGNOSIS — Z881 Allergy status to other antibiotic agents status: Secondary | ICD-10-CM | POA: Diagnosis not present

## 2017-11-17 DIAGNOSIS — G4733 Obstructive sleep apnea (adult) (pediatric): Secondary | ICD-10-CM

## 2017-11-17 DIAGNOSIS — K208 Other esophagitis: Secondary | ICD-10-CM | POA: Diagnosis not present

## 2017-11-17 DIAGNOSIS — Z888 Allergy status to other drugs, medicaments and biological substances status: Secondary | ICD-10-CM | POA: Diagnosis not present

## 2017-11-17 DIAGNOSIS — L89159 Pressure ulcer of sacral region, unspecified stage: Secondary | ICD-10-CM | POA: Diagnosis not present

## 2017-11-17 DIAGNOSIS — B9620 Unspecified Escherichia coli [E. coli] as the cause of diseases classified elsewhere: Secondary | ICD-10-CM | POA: Diagnosis present

## 2017-11-17 DIAGNOSIS — R059 Cough, unspecified: Secondary | ICD-10-CM

## 2017-11-17 DIAGNOSIS — Z960 Presence of urogenital implants: Secondary | ICD-10-CM | POA: Diagnosis not present

## 2017-11-17 DIAGNOSIS — A419 Sepsis, unspecified organism: Secondary | ICD-10-CM

## 2017-11-17 DIAGNOSIS — F329 Major depressive disorder, single episode, unspecified: Secondary | ICD-10-CM | POA: Diagnosis present

## 2017-11-17 DIAGNOSIS — K921 Melena: Secondary | ICD-10-CM | POA: Diagnosis not present

## 2017-11-17 DIAGNOSIS — K449 Diaphragmatic hernia without obstruction or gangrene: Secondary | ICD-10-CM | POA: Diagnosis not present

## 2017-11-17 DIAGNOSIS — Z79899 Other long term (current) drug therapy: Secondary | ICD-10-CM

## 2017-11-17 DIAGNOSIS — Z8719 Personal history of other diseases of the digestive system: Secondary | ICD-10-CM

## 2017-11-17 DIAGNOSIS — M2550 Pain in unspecified joint: Secondary | ICD-10-CM | POA: Diagnosis not present

## 2017-11-17 DIAGNOSIS — R52 Pain, unspecified: Secondary | ICD-10-CM | POA: Diagnosis not present

## 2017-11-17 DIAGNOSIS — E43 Unspecified severe protein-calorie malnutrition: Secondary | ICD-10-CM

## 2017-11-17 DIAGNOSIS — L89110 Pressure ulcer of right upper back, unstageable: Secondary | ICD-10-CM | POA: Diagnosis not present

## 2017-11-17 DIAGNOSIS — D649 Anemia, unspecified: Secondary | ICD-10-CM | POA: Diagnosis not present

## 2017-11-17 DIAGNOSIS — I313 Pericardial effusion (noninflammatory): Secondary | ICD-10-CM | POA: Diagnosis not present

## 2017-11-17 DIAGNOSIS — D509 Iron deficiency anemia, unspecified: Secondary | ICD-10-CM | POA: Diagnosis present

## 2017-11-17 LAB — COMPREHENSIVE METABOLIC PANEL
ALK PHOS: 62 U/L (ref 38–126)
ALT: 11 U/L (ref 0–44)
AST: 13 U/L — AB (ref 15–41)
Albumin: 2.5 g/dL — ABNORMAL LOW (ref 3.5–5.0)
Anion gap: 8 (ref 5–15)
BUN: 12 mg/dL (ref 6–20)
CHLORIDE: 107 mmol/L (ref 98–111)
CO2: 24 mmol/L (ref 22–32)
CREATININE: 0.49 mg/dL — AB (ref 0.61–1.24)
Calcium: 8.6 mg/dL — ABNORMAL LOW (ref 8.9–10.3)
GFR calc Af Amer: >60 mL/min (ref >60)
Glucose, Bld: 96 mg/dL (ref 70–99)
Potassium: 4 mmol/L (ref 3.5–5.1)
Sodium: 139 mmol/L (ref 135–145)
Total Bilirubin: 0.6 mg/dL (ref 0.3–1.2)
Total Protein: 6.9 g/dL (ref 6.5–8.1)

## 2017-11-17 LAB — CBC WITH DIFFERENTIAL/PLATELET
Abs Immature Granulocytes: 0.1 10*3/uL (ref 0.0–0.1)
Basophils Absolute: 0 10*3/uL (ref 0.0–0.1)
Basophils Relative: 0 %
EOS PCT: 2 %
Eosinophils Absolute: 0.2 10*3/uL (ref 0.0–0.7)
HEMATOCRIT: 27.2 % — AB (ref 39.0–52.0)
Hemoglobin: 7.7 g/dL — ABNORMAL LOW (ref 13.0–17.0)
Immature Granulocytes: 1 %
LYMPHS ABS: 1.5 10*3/uL (ref 0.7–4.0)
Lymphocytes Relative: 13 %
MCH: 22.8 pg — ABNORMAL LOW (ref 26.0–34.0)
MCHC: 28.3 g/dL — AB (ref 30.0–36.0)
MCV: 80.7 fL (ref 78.0–100.0)
MONOS PCT: 12 %
Monocytes Absolute: 1.4 10*3/uL — ABNORMAL HIGH (ref 0.1–1.0)
Neutro Abs: 8.2 10*3/uL — ABNORMAL HIGH (ref 1.7–7.7)
Neutrophils Relative %: 72 %
Platelets: 365 10*3/uL (ref 150–400)
RBC: 3.37 MIL/uL — ABNORMAL LOW (ref 4.22–5.81)
RDW: 16.9 % — AB (ref 11.5–15.5)
WBC: 11.4 10*3/uL — ABNORMAL HIGH (ref 4.0–10.5)

## 2017-11-17 LAB — URINALYSIS, ROUTINE W REFLEX MICROSCOPIC
Bilirubin Urine: NEGATIVE
Glucose, UA: NEGATIVE mg/dL
Ketones, ur: 5 mg/dL — AB
Nitrite: POSITIVE — AB
PH: 5 (ref 5.0–8.0)
Protein, ur: NEGATIVE mg/dL
SPECIFIC GRAVITY, URINE: 1.014 (ref 1.005–1.030)

## 2017-11-17 LAB — LIPASE, BLOOD: Lipase: 40 U/L (ref 11–51)

## 2017-11-17 MED ORDER — ONDANSETRON HCL 4 MG/2ML IJ SOLN
4.0000 mg | Freq: Four times a day (QID) | INTRAMUSCULAR | Status: DC | PRN
  Administered 2017-11-18: 4 mg via INTRAVENOUS
  Filled 2017-11-17: qty 2

## 2017-11-17 MED ORDER — ZINC SULFATE 220 (50 ZN) MG PO CAPS
220.0000 mg | ORAL_CAPSULE | Freq: Two times a day (BID) | ORAL | Status: DC
  Administered 2017-11-18 – 2017-11-27 (×19): 220 mg via ORAL
  Filled 2017-11-17 (×19): qty 1

## 2017-11-17 MED ORDER — PANTOPRAZOLE SODIUM 40 MG PO TBEC
40.0000 mg | DELAYED_RELEASE_TABLET | Freq: Every day | ORAL | Status: DC
  Administered 2017-11-17: 40 mg via ORAL
  Filled 2017-11-17: qty 1

## 2017-11-17 MED ORDER — SODIUM CHLORIDE 0.9 % IV BOLUS
1000.0000 mL | Freq: Once | INTRAVENOUS | Status: AC
  Administered 2017-11-17: 1000 mL via INTRAVENOUS

## 2017-11-17 MED ORDER — ADULT MULTIVITAMIN W/MINERALS CH
1.0000 | ORAL_TABLET | Freq: Every morning | ORAL | Status: DC
  Administered 2017-11-18 – 2017-11-27 (×10): 1 via ORAL
  Filled 2017-11-17 (×11): qty 1

## 2017-11-17 MED ORDER — SODIUM CHLORIDE 0.9% FLUSH: 3.0000 mL | INTRAVENOUS | Status: DC | PRN

## 2017-11-17 MED ORDER — PROMETHAZINE HCL 25 MG/ML IJ SOLN
12.5000 mg | Freq: Once | INTRAMUSCULAR | Status: AC
Start: 2017-11-17 — End: 2017-11-17
  Administered 2017-11-17: 12.5 mg via INTRAVENOUS
  Filled 2017-11-17: qty 1

## 2017-11-17 MED ORDER — SODIUM CHLORIDE 0.9 % IV SOLN
2.0000 g | Freq: Once | INTRAVENOUS | Status: AC
  Administered 2017-11-18: 2 g via INTRAVENOUS
  Filled 2017-11-17: qty 2

## 2017-11-17 MED ORDER — SODIUM CHLORIDE 0.9 % IV SOLN
1.0000 g | Freq: Three times a day (TID) | INTRAVENOUS | Status: DC
  Administered 2017-11-18 – 2017-11-19 (×4): 1 g via INTRAVENOUS
  Filled 2017-11-17 (×5): qty 1

## 2017-11-17 MED ORDER — SODIUM CHLORIDE 0.9 % IV SOLN
250.0000 mL | INTRAVENOUS | Status: DC | PRN
  Administered 2017-11-19: 10 mL via INTRAVENOUS

## 2017-11-17 MED ORDER — FAMOTIDINE 20 MG PO TABS
20.0000 mg | ORAL_TABLET | Freq: Two times a day (BID) | ORAL | Status: DC
  Administered 2017-11-18 – 2017-11-27 (×18): 20 mg via ORAL
  Filled 2017-11-17 (×18): qty 1

## 2017-11-17 MED ORDER — SENNOSIDES-DOCUSATE SODIUM 8.6-50 MG PO TABS: 1.0000 | ORAL_TABLET | Freq: Every evening | ORAL | Status: DC | PRN

## 2017-11-17 MED ORDER — METOCLOPRAMIDE HCL 10 MG PO TABS
10.0000 mg | ORAL_TABLET | Freq: Four times a day (QID) | ORAL | Status: DC
  Administered 2017-11-18 – 2017-11-24 (×21): 10 mg via ORAL
  Filled 2017-11-17 (×21): qty 1

## 2017-11-17 MED ORDER — ONDANSETRON HCL 4 MG PO TABS
4.0000 mg | ORAL_TABLET | Freq: Four times a day (QID) | ORAL | Status: DC | PRN
  Administered 2017-11-20: 4 mg via ORAL
  Filled 2017-11-17: qty 1

## 2017-11-17 MED ORDER — VITAMIN C 500 MG PO TABS
500.0000 mg | ORAL_TABLET | Freq: Every day | ORAL | Status: DC
  Administered 2017-11-18 – 2017-11-27 (×10): 500 mg via ORAL
  Filled 2017-11-17 (×11): qty 1

## 2017-11-17 MED ORDER — SUCRALFATE 1 GM/10ML PO SUSP
1.0000 g | Freq: Four times a day (QID) | ORAL | Status: DC
  Administered 2017-11-18 – 2017-11-27 (×38): 1 g via ORAL
  Filled 2017-11-17 (×37): qty 10

## 2017-11-17 MED ORDER — FESOTERODINE FUMARATE ER 8 MG PO TB24
8.0000 mg | ORAL_TABLET | Freq: Every day | ORAL | Status: DC
  Administered 2017-11-18 – 2017-11-27 (×10): 8 mg via ORAL
  Filled 2017-11-17 (×10): qty 1

## 2017-11-17 MED ORDER — CEPHALEXIN 500 MG PO CAPS: 500.0000 mg | ORAL_CAPSULE | Freq: Four times a day (QID) | ORAL | 0 refills | Status: DC

## 2017-11-17 MED ORDER — SODIUM CHLORIDE 0.9% FLUSH
3.0000 mL | Freq: Two times a day (BID) | INTRAVENOUS | Status: DC
  Administered 2017-11-18 – 2017-11-27 (×11): 3 mL via INTRAVENOUS

## 2017-11-17 MED ORDER — ACETAMINOPHEN 650 MG RE SUPP: 650.0000 mg | Freq: Four times a day (QID) | RECTAL | Status: DC | PRN

## 2017-11-17 MED ORDER — PROMETHAZINE HCL 25 MG/ML IJ SOLN
12.5000 mg | Freq: Once | INTRAMUSCULAR | Status: AC
  Administered 2017-11-17: 12.5 mg via INTRAVENOUS
  Filled 2017-11-17: qty 1

## 2017-11-17 MED ORDER — JUVEN PO PACK
1.0000 | PACK | Freq: Two times a day (BID) | ORAL | Status: DC
  Administered 2017-11-18 – 2017-11-27 (×14): 1 via ORAL
  Filled 2017-11-17 (×20): qty 1

## 2017-11-17 MED ORDER — ACETAMINOPHEN 325 MG PO TABS
650.0000 mg | ORAL_TABLET | Freq: Four times a day (QID) | ORAL | Status: DC | PRN
  Administered 2017-11-18 – 2017-11-20 (×5): 650 mg via ORAL
  Filled 2017-11-17 (×6): qty 2

## 2017-11-17 MED ORDER — SODIUM CHLORIDE 0.9 % IV SOLN
1.0000 g | Freq: Once | INTRAVENOUS | Status: AC
  Administered 2017-11-17: 1 g via INTRAVENOUS
  Filled 2017-11-17: qty 10

## 2017-11-17 MED ORDER — HYDROCODONE-ACETAMINOPHEN 5-325 MG PO TABS: 1.0000 | ORAL_TABLET | ORAL | Status: DC | PRN

## 2017-11-17 MED ORDER — PANTOPRAZOLE SODIUM 40 MG PO TBEC
40.0000 mg | DELAYED_RELEASE_TABLET | Freq: Two times a day (BID) | ORAL | Status: DC
  Administered 2017-11-18 – 2017-11-27 (×19): 40 mg via ORAL
  Filled 2017-11-17 (×19): qty 1

## 2017-11-17 MED ORDER — BACLOFEN 20 MG PO TABS
20.0000 mg | ORAL_TABLET | Freq: Four times a day (QID) | ORAL | Status: DC
  Administered 2017-11-18 – 2017-11-27 (×32): 20 mg via ORAL
  Filled 2017-11-17 (×2): qty 1
  Filled 2017-11-17: qty 4
  Filled 2017-11-17 (×2): qty 1
  Filled 2017-11-17: qty 4
  Filled 2017-11-17: qty 1
  Filled 2017-11-17: qty 4
  Filled 2017-11-17: qty 1
  Filled 2017-11-17 (×3): qty 4
  Filled 2017-11-17 (×2): qty 1
  Filled 2017-11-17: qty 4
  Filled 2017-11-17: qty 1
  Filled 2017-11-17: qty 4
  Filled 2017-11-17: qty 1
  Filled 2017-11-17: qty 4
  Filled 2017-11-17 (×4): qty 1
  Filled 2017-11-17: qty 4
  Filled 2017-11-17: qty 1
  Filled 2017-11-17 (×4): qty 4
  Filled 2017-11-17 (×2): qty 1
  Filled 2017-11-17: qty 2
  Filled 2017-11-17 (×4): qty 1

## 2017-11-17 NOTE — ED Notes (Signed)
Pt requesting to speak with provider, EDP made aware x2

## 2017-11-17 NOTE — Discharge Instructions (Addendum)
Follow up with your GI doctor regarding your reflux medications and management. Take Keflex as prescribed and complete the full course. Return to ER for fevers, worsening or concerning symptoms.

## 2017-11-17 NOTE — ED Provider Notes (Addendum)
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 bleeding or worsening of condition.   11/27/17 1415    cephALEXin (KEFLEX) 500 MG capsule  4 times daily,   Status:  Discontinued     11/17/17 [place], Kambrea Carrasco A, PA-C 11/17/17 1803    Percell Miller Hewitt Shorts, PA-C 11/25/17 2154    Hayden Rasmussen, MD 11/27/17 1225    Tacy Learn, PA-C 12/01/17 1657    Hayden Rasmussen, MD 12/01/17 [phone]

## 2017-11-17 NOTE — ED Triage Notes (Signed)
Pt brought in by [facility] from home for nausea and vomiting x4 weeks. Pt describes a "burning" feeling in his throat. Pt seen at [facility] x2 weeks ago, eval was negative and pt was sent home with zofran w/o relief. Pt states he has been taking omeprazole for the "burning" w/o relief as well. Per pt home health staff- pt's pressure ulcers worsening over the last month and they would like pt to be evaluated. Pt A+Ox4 and in NAD on arrival.

## 2017-11-17 NOTE — Progress Notes (Signed)
Pharmacy Antibiotic Note  [name] [name] is a 51 y.o. male admitted on 11/17/2017 with UTI.  Pharmacy has been consulted for Cefepime dosing. Hx of the same. WBC 11.4. Scr close to baseline in this quadriplegic pt.   Plan: Cefepime 1g IV q8h Trend WBC, temp, renal function  F/U infectious work-up   Weight: 200 lb (90.7 kg)  Temp (24hrs), Avg:99.7 F (37.6 C), Min:99.2 F (37.3 C), Max:100.2 F (37.9 C)  Recent Labs  Lab 11/17/17 1518  WBC 11.4*  CREATININE 0.49*    Estimated Creatinine Clearance: 119.9 mL/min (A) (by C-G formula based on SCr of 0.49 mg/dL (L)).    Allergies  Allergen Reactions  . Feraheme [Ferumoxytol] Other (See Comments)    SYNCOPE  . Ditropan [Oxybutynin] Other (See Comments)    Hallucinations   . Vancomycin Other (See Comments)    ARF 05-2016 -- affects kidneys     Narda Bonds 11/17/2017 11:35 PM

## 2017-11-17 NOTE — ED Notes (Addendum)
Pt requesting to speak with EDP- EDP made aware of pt HR of 110 and temp of 99.41F
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
## 2017-11-18 ENCOUNTER — Inpatient Hospital Stay (HOSPITAL_COMMUNITY): Payer: Medicare Other

## 2017-11-18 DIAGNOSIS — D473 Essential (hemorrhagic) thrombocythemia: Secondary | ICD-10-CM | POA: Insufficient documentation

## 2017-11-18 LAB — CBC WITH DIFFERENTIAL/PLATELET
ABS IMMATURE GRANULOCYTES: 0.1 10*3/uL (ref 0.0–0.1)
BASOS ABS: 0 10*3/uL (ref 0.0–0.1)
Basophils Relative: 0 %
EOS PCT: 1 %
Eosinophils Absolute: 0.1 10*3/uL (ref 0.0–0.7)
HEMATOCRIT: 25.3 % — AB (ref 39.0–52.0)
HEMOGLOBIN: 7.2 g/dL — AB (ref 13.0–17.0)
Immature Granulocytes: 1 %
LYMPHS PCT: 12 %
Lymphs Abs: 1.1 10*3/uL (ref 0.7–4.0)
MCH: 23.2 pg — ABNORMAL LOW (ref 26.0–34.0)
MCHC: 28.5 g/dL — ABNORMAL LOW (ref 30.0–36.0)
MCV: 81.6 fL (ref 78.0–100.0)
MONO ABS: 0.3 10*3/uL (ref 0.1–1.0)
MONOS PCT: 4 %
NEUTROS ABS: 7.5 10*3/uL (ref 1.7–7.7)
Neutrophils Relative %: 82 %
Platelets: 309 10*3/uL (ref 150–400)
RBC: 3.1 MIL/uL — ABNORMAL LOW (ref 4.22–5.81)
RDW: 17.2 % — ABNORMAL HIGH (ref 11.5–15.5)
WBC: 9 10*3/uL (ref 4.0–10.5)

## 2017-11-18 LAB — IRON AND TIBC
Iron: 11 ug/dL — ABNORMAL LOW (ref 45–182)
SATURATION RATIOS: 8 % — AB (ref 17.9–39.5)
TIBC: 133 ug/dL — ABNORMAL LOW (ref 250–450)
UIBC: 122 ug/dL

## 2017-11-18 LAB — BASIC METABOLIC PANEL
Anion gap: 13 (ref 5–15)
BUN: 11 mg/dL (ref 6–20)
CO2: 22 mmol/L (ref 22–32)
Calcium: 8.3 mg/dL — ABNORMAL LOW (ref 8.9–10.3)
Chloride: 109 mmol/L (ref 98–111)
Creatinine, Ser: 0.56 mg/dL — ABNORMAL LOW (ref 0.61–1.24)
GFR calc Af Amer: >60 mL/min (ref >60)
GFR calc non Af Amer: >60 mL/min (ref >60)
GLUCOSE: 87 mg/dL (ref 70–99)
POTASSIUM: 3.5 mmol/L (ref 3.5–5.1)
Sodium: 144 mmol/L (ref 135–145)

## 2017-11-18 LAB — CK: Total CK: 36 U/L — ABNORMAL LOW (ref 49–397)

## 2017-11-18 LAB — RETICULOCYTES
RBC.: 3.1 MIL/uL — AB (ref 4.22–5.81)
Retic Count, Absolute: 40.3 10*3/uL (ref 19.0–186.0)
Retic Ct Pct: 1.3 % (ref 0.4–3.1)

## 2017-11-18 LAB — PROTIME-INR
INR: 1.22
PROTHROMBIN TIME: 15.3 s — AB (ref 11.4–15.2)

## 2017-11-18 LAB — VITAMIN B12: Vitamin B-12: 741 pg/mL (ref 180–914)

## 2017-11-18 LAB — MRSA PCR SCREENING: MRSA BY PCR: POSITIVE — AB

## 2017-11-18 LAB — PREPARE RBC (CROSSMATCH)

## 2017-11-18 LAB — FOLATE: Folate: 13.2 ng/mL (ref >5.9)

## 2017-11-18 LAB — FERRITIN: FERRITIN: 494 ng/mL — AB (ref 24–336)

## 2017-11-18 LAB — LACTIC ACID, PLASMA: LACTIC ACID, VENOUS: 0.6 mmol/L (ref 0.5–1.9)

## 2017-11-18 LAB — HEMOGLOBIN AND HEMATOCRIT, BLOOD
HEMATOCRIT: 23.5 % — AB (ref 39.0–52.0)
HEMOGLOBIN: 6.8 g/dL — AB (ref 13.0–17.0)

## 2017-11-18 MED ORDER — IOHEXOL 300 MG/ML  SOLN
100.0000 mL | Freq: Once | INTRAMUSCULAR | Status: AC | PRN
  Administered 2017-11-18: 100 mL via INTRAVENOUS

## 2017-11-18 MED ORDER — SODIUM CHLORIDE 0.9 % IV SOLN
500.0000 mg | INTRAVENOUS | Status: AC
  Administered 2017-11-18 – 2017-11-23 (×6): 500 mg via INTRAVENOUS
  Filled 2017-11-18 (×6): qty 10

## 2017-11-18 MED ORDER — SODIUM CHLORIDE 0.9% IV SOLUTION
Freq: Once | INTRAVENOUS | Status: AC
  Administered 2017-11-18: 21:00:00 via INTRAVENOUS

## 2017-11-18 MED ORDER — LACTATED RINGERS IV BOLUS
1000.0000 mL | Freq: Once | INTRAVENOUS | Status: AC
  Administered 2017-11-18: 1000 mL via INTRAVENOUS

## 2017-11-18 MED ORDER — METRONIDAZOLE 500 MG PO TABS
500.0000 mg | ORAL_TABLET | Freq: Three times a day (TID) | ORAL | Status: DC
  Administered 2017-11-18: 500 mg via ORAL
  Filled 2017-11-18 (×2): qty 1

## 2017-11-18 MED ORDER — LACTATED RINGERS IV SOLN
INTRAVENOUS | Status: AC
  Administered 2017-11-18 – 2017-11-19 (×2): via INTRAVENOUS

## 2017-11-18 MED ORDER — LACTATED RINGERS IV BOLUS
500.0000 mL | Freq: Once | INTRAVENOUS | Status: AC
  Administered 2017-11-18: 500 mL via INTRAVENOUS

## 2017-11-18 MED ORDER — IOPAMIDOL (ISOVUE-300) INJECTION 61%
15.0000 mL | INTRAVENOUS | Status: AC
  Administered 2017-11-18 (×2): 15 mL via ORAL

## 2017-11-18 MED ORDER — SODIUM CHLORIDE 0.9% FLUSH
10.0000 mL | INTRAVENOUS | Status: DC | PRN
  Administered 2017-11-27: 10 mL
  Filled 2017-11-18: qty 40

## 2017-11-18 MED ORDER — FERROUS SULFATE 325 (65 FE) MG PO TABS
325.0000 mg | ORAL_TABLET | Freq: Every day | ORAL | Status: DC
  Administered 2017-11-19 – 2017-11-25 (×6): 325 mg via ORAL
  Filled 2017-11-18 (×6): qty 1

## 2017-11-18 MED ORDER — POLYETHYLENE GLYCOL 3350 17 G PO PACK
17.0000 g | PACK | Freq: Every day | ORAL | Status: DC
  Administered 2017-11-18 – 2017-11-23 (×5): 17 g via ORAL
  Filled 2017-11-18 (×7): qty 1

## 2017-11-18 MED ORDER — SODIUM CHLORIDE 0.9 % IV BOLUS
500.0000 mL | Freq: Once | INTRAVENOUS | Status: AC
  Administered 2017-11-18: 500 mL via INTRAVENOUS

## 2017-11-18 MED ORDER — IBUPROFEN 800 MG PO TABS
800.0000 mg | ORAL_TABLET | Freq: Once | ORAL | Status: AC
  Administered 2017-11-18: 800 mg via ORAL
  Filled 2017-11-18: qty 1

## 2017-11-18 NOTE — Progress Notes (Signed)
Keturah Shavers was notified of the patient's BP being 80/42. Pt is currently asymptomatic. Order received to give pt 500 ml NS bolus.

## 2017-11-18 NOTE — Progress Notes (Signed)
Pharmacy Antibiotic Note  [name] [name] is a 52 y.o. male admitted on 11/17/2017 with sepsis and decubitus ulcer.  Pharmacy has been consulted for daptomycin and cefepime dosing. Quadraplegic pt with h/o acute renal failure with vancomycin. Chronic osteomyelitis in area other than current wound. Chronic Foley and nitrite positive UA, so starting empiric UTI treatment.  Plan: Daptomycin 500 mg IV Q24 hours Continue cefepime 1 g Q8 hours Metronidazole 500 mg PO Q8 hours  F/u BCx, UCx Monitor SCr, CBC, CK  Height: 6' (182.9 cm) Weight: 190 lb 14.7 oz (86.6 kg) IBW/kg (Calculated) : 77.6  Temp (24hrs), Avg:99.8 F (37.7 C), Min:98.6 F (37 C), Max:101 F (38.3 C)  Recent Labs  Lab 11/17/17 1518 11/18/17 0500 11/18/17 0516  WBC 11.4* 9.0  --   CREATININE 0.49* 0.56*  --   LATICACIDVEN  --   --  0.6    Estimated Creatinine Clearance: 119.9 mL/min (A) (by C-G formula based on SCr of 0.56 mg/dL (L)).    Allergies  Allergen Reactions  . Feraheme [Ferumoxytol] Other (See Comments)    SYNCOPE  . Ditropan [Oxybutynin] Other (See Comments)    Hallucinations   . Vancomycin Other (See Comments)    ARF 05-2016 -- affects kidneys     Antimicrobials this admission: Cefepime 7/5 >>  Daptomycin 7/6 >> Metronidazole 7/6 >>  Dose adjustments this admission: none  Microbiology results: 7/6 BCx:  7/6 UCx:  7/6 MRSA PCR: positive  Thank you for allowing pharmacy to be a part of this patient's care.  Mills Koller 11/18/2017 9:47 AM

## 2017-11-18 NOTE — Progress Notes (Deleted)
Pt placed on BIPAP for the night via Dreamstation machine- Pt tolerating well.
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
## 2017-11-18 NOTE — Progress Notes (Signed)
Pt placed on CPAP for the night via dreamstation.  Pt tolerating well.

## 2017-11-18 NOTE — Progress Notes (Signed)
Received report from ED.  

## 2017-11-19 LAB — BASIC METABOLIC PANEL
Anion gap: 5 (ref 5–15)
CALCIUM: 8 mg/dL — AB (ref 8.9–10.3)
CHLORIDE: 107 mmol/L (ref 98–111)
CO2: 25 mmol/L (ref 22–32)
CREATININE: 0.44 mg/dL — AB (ref 0.61–1.24)
Glucose, Bld: 91 mg/dL (ref 70–99)
Potassium: 3.1 mmol/L — ABNORMAL LOW (ref 3.5–5.1)
SODIUM: 137 mmol/L (ref 135–145)

## 2017-11-19 LAB — TYPE AND SCREEN
ABO/RH(D): B POS
Antibody Screen: NEGATIVE
Unit division: 0

## 2017-11-19 LAB — BPAM RBC
BLOOD PRODUCT EXPIRATION DATE: 201907252359
ISSUE DATE / TIME: 201907062310
Unit Type and Rh: 7300

## 2017-11-19 LAB — CBC
HEMATOCRIT: 24.9 % — AB (ref 39.0–52.0)
HEMOGLOBIN: 7.4 g/dL — AB (ref 13.0–17.0)
MCH: 23.8 pg — AB (ref 26.0–34.0)
MCHC: 29.7 g/dL — AB (ref 30.0–36.0)
MCV: 80.1 fL (ref 78.0–100.0)
PLATELETS: 257 10*3/uL (ref 150–400)
RBC: 3.11 MIL/uL — ABNORMAL LOW (ref 4.22–5.81)
RDW: 16.6 % — AB (ref 11.5–15.5)
WBC: 7.7 10*3/uL (ref 4.0–10.5)

## 2017-11-19 LAB — URINE CULTURE: Culture: >=100000 — AB

## 2017-11-19 LAB — SEDIMENTATION RATE: SED RATE: 53 mm/h — AB (ref 0–16)

## 2017-11-19 LAB — MAGNESIUM: Magnesium: 1.2 mg/dL — ABNORMAL LOW (ref 1.7–2.4)

## 2017-11-19 LAB — HEMOGLOBIN AND HEMATOCRIT, BLOOD
HEMATOCRIT: 27.9 % — AB (ref 39.0–52.0)
Hemoglobin: 8.2 g/dL — ABNORMAL LOW (ref 13.0–17.0)

## 2017-11-19 LAB — C-REACTIVE PROTEIN: CRP: 13.8 mg/dL — ABNORMAL HIGH (ref <1.0)

## 2017-11-19 MED ORDER — SODIUM CHLORIDE 0.9 % IV SOLN
1.0000 g | Freq: Three times a day (TID) | INTRAVENOUS | Status: AC
  Administered 2017-11-19 – 2017-11-23 (×14): 1 g via INTRAVENOUS
  Filled 2017-11-19 (×14): qty 1

## 2017-11-19 MED ORDER — MAGNESIUM OXIDE 400 (241.3 MG) MG PO TABS
400.0000 mg | ORAL_TABLET | Freq: Two times a day (BID) | ORAL | Status: DC
  Administered 2017-11-19 – 2017-11-27 (×17): 400 mg via ORAL
  Filled 2017-11-19 (×17): qty 1

## 2017-11-19 MED ORDER — COLLAGENASE 250 UNIT/GM EX OINT
TOPICAL_OINTMENT | Freq: Every day | CUTANEOUS | Status: DC
  Administered 2017-11-19 – 2017-11-27 (×9): via TOPICAL
  Filled 2017-11-19 (×2): qty 30

## 2017-11-19 MED ORDER — CHLORHEXIDINE GLUCONATE CLOTH 2 % EX PADS
6.0000 | MEDICATED_PAD | Freq: Every day | CUTANEOUS | Status: DC
  Administered 2017-11-19 – 2017-11-27 (×8): 6 via TOPICAL

## 2017-11-19 MED ORDER — MAGNESIUM SULFATE 4 GM/100ML IV SOLN
4.0000 g | Freq: Once | INTRAVENOUS | Status: AC
  Administered 2017-11-19: 4 g via INTRAVENOUS
  Filled 2017-11-19: qty 100

## 2017-11-19 MED ORDER — POTASSIUM CHLORIDE CRYS ER 20 MEQ PO TBCR
40.0000 meq | EXTENDED_RELEASE_TABLET | ORAL | Status: AC
  Administered 2017-11-19 (×2): 40 meq via ORAL
  Filled 2017-11-19 (×2): qty 2

## 2017-11-19 MED ORDER — SULFAMETHOXAZOLE-TRIMETHOPRIM 800-160 MG PO TABS: 1.0000 | ORAL_TABLET | Freq: Two times a day (BID) | ORAL | Status: DC

## 2017-11-19 NOTE — Consult Note (Signed)
[place] Nurse wound consult note Reason for Consult: Patient well known to the [facility] Nursing team.  We are simultaneously consulted with Surgery ([facility]) and patient has been seen by both Dr. Kieth Brightly and Dr. Brantley Stage since 11/18/17). We will provide guidance for the Nursing staff regarding (established) ostomy  and follow up on the mattress replacement with low air loss feature that is not yet in place albeit ordered. Topical wound orders are provided at Nursing's request as there are none provided.  Mr. Pile is followed by the outpatient [facility] hospital by Dr. Dossie Der and has been seen by both Drs. Dillingham and Thimmappa (Plastic Surgery) in the past. Wound type: Pressure Pressure Injury POA: Yes Measurement: Right thoracic chest (Unstageable Pressure Injury) : 12cm x 7cm with wound bed obscured by the presence of eschar/necrotic tissue, one medial area is dark and firm.  Sacrum/bilateral ischial tuberosity/perineum/scrotum with healed/healing Stage 4 pressure injury with some areas of depth (epithelial tissue has grown into wound depth, epibole at edges) Nursing is provided direction to remove splint and assess skin prior to reapplying every 2 hours to prevent medical device related pressure injury (MDRPI). Wound bed: As described above Drainage (amount, consistency, odor) Moderate to large amount of serous to light yellow on old dressings. Musty odor. Patient states that dressings have not been changed since last pm pending wound care consult. Periwound: intact with evidence of healing, scarring, contraction and surgical procedures (graft) Dressing procedure/placement/frequency: Pressure redistribution is the cornerstone of Mr. Kana's care. I have provided Nursing with guidance for topical care of the large sacral/IT ulcer using Kerlix roll gauze moistened with NS and fan-folded into the defect, topped with ABD pads and secured with Medipore tape.  The right thoracic chest is with more necrotic tissue  than previously. While Dr. Dellia Nims has been debriding periodically, the [facility] team may elect for debridement later this week (according to notes). Alternatively, they may decide to involve Drs. Thimmappa or Dillingham at their descretion. This Probation officer has provided Orders for enzymatic debridement (collagenase/Santyl) topped with a saline moistened gauze dressing), but I will acquiesce to [facility] or Plastic Surgery physicians if they disagree.    [place] Nurse ostomy consult note Stoma type/location:  LLQ colostomy (long established) Stomal assessment/size: 1 and 5/8 inch round, raised, pink and moist.  Viewed through recently applied pouching system. Peristomal assessment: not seen today Treatment options for stomal/peristomal skin: Patient to use a skin barrier ring with next pouch change.  (States he did not have one last time) Output: Nothing in pouch at this time, but pouch was just recently applied.  Tech reports brown stool.  Ostomy pouching: 2pc. 2 and 3/4 inch pouching system.  Supply numbers provided in Orders for patient. Education provided: None required.  Patient is not self care, knows the nuances of his ostomy and his management system. Enrolled patient in [place] program: No. Patient is established with a provider.  [facility] nursing team will not follow, but will remain available to this patient, the nursing and medical teams.  Please re-consult if needed. Thanks, Maudie Flakes, MSN, RN, [place], Arther Abbott  Pager# [phone]
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
## 2017-11-19 NOTE — Plan of Care (Signed)
Tolerating room air without difficulty.  Diet advanced to regular today and tolerated lunch well.  Wound care nurse in to see pt with wound care recommendations.  Air bed has been ordered.

## 2017-11-19 NOTE — Progress Notes (Signed)
Wound care completed using aseptic technique per wound care orders.  Air bed has been ordered, has not yet arrived.

## 2017-11-20 ENCOUNTER — Inpatient Hospital Stay (HOSPITAL_COMMUNITY): Payer: Medicare Other

## 2017-11-20 LAB — CBC
HCT: 28.1 % — ABNORMAL LOW (ref 39.0–52.0)
Hemoglobin: 8.4 g/dL — ABNORMAL LOW (ref 13.0–17.0)
MCH: 23.7 pg — ABNORMAL LOW (ref 26.0–34.0)
MCHC: 29.9 g/dL — ABNORMAL LOW (ref 30.0–36.0)
MCV: 79.4 fL (ref 78.0–100.0)
PLATELETS: 319 10*3/uL (ref 150–400)
RBC: 3.54 MIL/uL — ABNORMAL LOW (ref 4.22–5.81)
RDW: 16.6 % — AB (ref 11.5–15.5)
WBC: 10.5 10*3/uL (ref 4.0–10.5)

## 2017-11-20 LAB — BASIC METABOLIC PANEL
ANION GAP: 5 (ref 5–15)
BUN: 10 mg/dL (ref 6–20)
CALCIUM: 8.3 mg/dL — AB (ref 8.9–10.3)
CO2: 27 mmol/L (ref 22–32)
CREATININE: 0.42 mg/dL — AB (ref 0.61–1.24)
Chloride: 107 mmol/L (ref 98–111)
GFR calc Af Amer: >60 mL/min (ref >60)
GLUCOSE: 104 mg/dL — AB (ref 70–99)
Potassium: 4 mmol/L (ref 3.5–5.1)
Sodium: 139 mmol/L (ref 135–145)

## 2017-11-20 LAB — MAGNESIUM: Magnesium: 1.7 mg/dL (ref 1.7–2.4)
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
## 2017-11-20 NOTE — Progress Notes (Addendum)
[facility] Progress Note     Subjective: CC:  [name]. At baseline lives at home with family and has [facility] nurse.  Objective: Vital signs in last 24 hours: Temp:  [99.2 F (37.3 C)-103 F (39.4 C)] 99.6 F (37.6 C) (07/07 2303) Pulse Rate:  [72-111] 94 (07/07 2303) Resp:  [19-27] 27 (07/07 2303) BP: (85-133)/(67-78) 131/73 (07/07 2303) SpO2:  [97 %-100 %] 99 % (07/07 2303) Weight:  [204 lb 12.9 oz (92.9 kg)] 204 lb 12.9 oz (92.9 kg) (07/08 0500) Last BM Date: 11/19/17  Intake/Output from previous day: 07/07 0701 - 07/08 0700 In: 1230 [P.O.:930; IV Piggyback:300] Out: 0240 [Urine:4050] Intake/Output this shift: No intake/output data recorded.  PE: Gen:  Alert, NAD, pleasant Abd: Soft, non-tender, non-distended, bowel sounds present  Skin: right posterior chest wound as below - small ~2cm firm escar inferior and lateral aspect of wound and small amt tissue necrosis superiorly as well. palpable underlying ribs.  Santyl wet to dry dressing applied.   Large sacral/perineal decubitus ulcer as below with 4 cm tunneling over R ischial tuberosity. No necrosis appreciated, minimal yellow slough. Re-dressed with wet-to-dry kerlix dressing and ABD pads.   Psych: A&Ox3   Lab Results:  Recent Labs    11/19/17 0552 11/19/17 1720 11/20/17 0410  WBC 7.7  --  10.5  HGB 7.4* 8.2* 8.4*  HCT 24.9* 27.9* 28.1*  PLT 257  --  319   BMET Recent Labs    11/19/17 0552 11/20/17 0410  NA 137 139  K 3.1* 4.0  CL 107 107  CO2 25 27  GLUCOSE 91 104*  BUN <5* 10  CREATININE 0.44* 0.42*  CALCIUM 8.0* 8.3*   PT/INR Recent Labs    11/18/17 0544  LABPROT 15.3*  INR 1.22   CMP     Component Value Date/Time   NA 139 11/20/2017 0410   NA 139 11/10/2016   NA 137 10/07/2014 1148   K 4.0 11/20/2017 0410   K 3.7 10/07/2014 1148   CL 107 11/20/2017 0410   CO2 27 11/20/2017 0410   CO2 22 10/07/2014 1148   GLUCOSE 104 (H) 11/20/2017 0410   GLUCOSE 117  10/07/2014 1148   BUN 10 11/20/2017 0410   BUN 10 11/10/2016   BUN 8.7 10/07/2014 1148   CREATININE 0.42 (L) 11/20/2017 0410   CREATININE 0.57 (L) 07/01/2015 1606   CREATININE 0.6 (L) 10/07/2014 1148   CALCIUM 8.3 (L) 11/20/2017 0410   CALCIUM 9.1 10/07/2014 1148   PROT 6.9 11/17/2017 1518   PROT 8.0 10/07/2014 1148   ALBUMIN 2.5 (L) 11/17/2017 1518   ALBUMIN 2.8 (L) 10/07/2014 1148   AST 13 (L) 11/17/2017 1518   AST 16 10/07/2014 1148   ALT 11 11/17/2017 1518   ALT 7 10/07/2014 1148   ALKPHOS 62 11/17/2017 1518   ALKPHOS 80 10/07/2014 1148   BILITOT 0.6 11/17/2017 1518   BILITOT <0.20 10/07/2014 1148   GFRNONAA >60 11/20/2017 0410   GFRAA >60 11/20/2017 0410   Lipase     Component Value Date/Time   LIPASE 40 11/17/2017 1518       Studies/Results: Ct Chest W Contrast  Result Date: 11/18/2017 CLINICAL DATA:  Inpatient. Quadriplegia. Chronic pressure wound to the right back. Fever. Abdominal pain. Nausea. EXAM: CT CHEST, ABDOMEN, AND PELVIS WITH CONTRAST TECHNIQUE: Multidetector CT imaging of the chest, abdomen and pelvis was performed following the standard protocol during bolus administration of intravenous contrast. CONTRAST:  164mL OMNIPAQUE IOHEXOL 300 MG/ML  SOLN COMPARISON:  11/17/2017 chest radiograph. 10/30/2017 CT abdomen/pelvis. 07/19/2017 chest CT. FINDINGS: CT CHEST FINDINGS Cardiovascular: Top-normal heart size. Small pericardial effusion/thickening, not appreciably changed. Normal course and caliber of the thoracic aorta. Stable borderline prominent main pulmonary artery (3.3 cm diameter). No central pulmonary emboli. Right internal jugular MediPort terminates in the lower third of the SVC. Mediastinum/Nodes: Stable hypodense 1.7 cm posterior left thyroid lobe nodule. Mild-to-moderate circumferential wall thickening in the lower thoracic esophagus, not definitely changed. No pathologically enlarged axillary nodes. Stable enlarged high left mediastinal prevascular nodes  measuring up to 1.8 cm (series 3/image 20). No new pathologically enlarged mediastinal nodes. No hilar adenopathy. Lungs/Pleura: No pneumothorax. Trace dependent right pleural effusion. No significant left pleural effusion. Bandlike opacities with associated volume loss in the bilateral lower lobes, compatible with mild-to-moderate atelectasis versus postinfectious/postinflammatory scarring. No lung masses or significant pulmonary nodules in the aerated portions of the lungs. Musculoskeletal: There is an expansile lytic 6 cm mass in the posterior right chest wall involving posterior right fourth and fifth ribs and inferior right scapula, stable in size since 07/19/2017 chest CT. Multiple healed deformities in the left ribs. Marked lower cervical spine degenerative changes. Mild thoracic spondylosis. No new focal osseous lesions. Symmetric prominent gynecomastia. Chronic large decubitus ulcer in lateral right back (series 3/image 40). Adjacent sclerosis and well corticated irregularity in the posterior right eighth through tenth ribs, unchanged since 07/19/2017 chest CT, compatible with chronic osteomyelitis. No acute osseous erosions. CT ABDOMEN PELVIS FINDINGS Examination limited by streak artifact from the patient's upper extremities. Hepatobiliary: Normal liver with no liver mass. Normal gallbladder with no radiopaque cholelithiasis. No intrahepatic biliary ductal dilatation. Stable CBD diameter 6 mm, top-normal. Pancreas: Normal, with no mass or duct dilation. Spleen: Normal size. No mass. Adrenals/Urinary Tract: Stable appearance of the adrenal glands without discrete adrenal nodules. Hypodense 1.1 cm interpolar left renal cortical lesion (series 8/image 18), stable in size since 03/10/2016 CT, probably benign. No new renal lesions. No hydronephrosis. Suprapubic Foley catheter is in place. Collapsed bladder with no acute abnormality. Stomach/Bowel: Small hiatal hernia. Otherwise normal nondistended stomach.  Normal caliber small bowel with no small bowel wall thickening. Normal appendix. Status post end sigmoid colostomy in the ventral left abdominal wall. Stable moderate stool throughout Hartmann's pouch. No large bowel wall thickening or acute pericolonic fat stranding. Moderate colonic stool. Vascular/Lymphatic: Normal caliber abdominal aorta. Patent portal, splenic, hepatic and renal veins. No pathologically enlarged abdominal nodes. Bilateral inguinal adenopathy up to 1.6 cm on the right (series 3/image 130), unchanged. Bilateral external iliac lymphadenopathy up to 1.9 cm on the right (series 3/image 107), unchanged. Reproductive: Normal size prostate. Other: No pneumoperitoneum, ascites or focal fluid collection. Musculoskeletal: Stable large sacral and bilateral ischial decubitus ulcers. 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 No appreciable acute osseous erosions. IMPRESSION: 1. Small hiatal hernia. Chronic circumferential wall thickening in the lower thoracic esophagus, nonspecific, not definitely changed, which could be due to reflux esophagitis, Barrett's esophagus and/or neoplasm. 2. No evidence of bowel obstruction or acute bowel inflammation. Stable postsurgical changes from end sigmoid colostomy. 3. Expansile lytic 6 cm mass in the posterior right chest wall involving the posterior right fourth and fifth ribs and inferior right scapula, indeterminate for neoplasm, stable since 07/19/2017 chest CT. 4. Stable chronic large right lateral back decubitus ulcer with chronic osteomyelitis in the right posterior eighth through tenth ribs. 5. Stable large sacral and bilateral ischial decubitus ulcers with  extensive chronic osteomyelitis and fragmentation throughout the bilateral pelvic girdle. 6. Mild-to-moderate atelectasis versus postinfectious/postinflammatory scarring in the bilateral lower lung lobes. 7. Chronic  mediastinal and bilateral pelvic adenopathy, unchanged, more likely reactive. Electronically Signed   By: Ilona Sorrel M.D.   On: 11/18/2017 14:33   Ct Abdomen Pelvis W Contrast  Result Date: 11/18/2017 CLINICAL DATA:  Inpatient. Quadriplegia. Chronic pressure wound to the right back. Fever. Abdominal pain. Nausea. EXAM: CT CHEST, ABDOMEN, AND PELVIS WITH CONTRAST TECHNIQUE: Multidetector CT imaging of the chest, abdomen and pelvis was performed following the standard protocol during bolus administration of intravenous contrast. CONTRAST:  155mL OMNIPAQUE IOHEXOL 300 MG/ML  SOLN COMPARISON:  11/17/2017 chest radiograph. 10/30/2017 CT abdomen/pelvis. 07/19/2017 chest CT. FINDINGS: CT CHEST FINDINGS Cardiovascular: Top-normal heart size. Small pericardial effusion/thickening, not appreciably changed. Normal course and caliber of the thoracic aorta. Stable borderline prominent main pulmonary artery (3.3 cm diameter). No central pulmonary emboli. Right internal jugular MediPort terminates in the lower third of the SVC. Mediastinum/Nodes: Stable hypodense 1.7 cm posterior left thyroid lobe nodule. Mild-to-moderate circumferential wall thickening in the lower thoracic esophagus, not definitely changed. No pathologically enlarged axillary nodes. Stable enlarged high left mediastinal prevascular nodes measuring up to 1.8 cm (series 3/image 20). No new pathologically enlarged mediastinal nodes. No hilar adenopathy. Lungs/Pleura: No pneumothorax. Trace dependent right pleural effusion. No significant left pleural effusion. Bandlike opacities with associated volume loss in the bilateral lower lobes, compatible with mild-to-moderate atelectasis versus postinfectious/postinflammatory scarring. No lung masses or significant pulmonary nodules in the aerated portions of the lungs. Musculoskeletal: There is an expansile lytic 6 cm mass in the posterior right chest wall involving posterior right fourth and fifth ribs and inferior  right scapula, stable in size since 07/19/2017 chest CT. Multiple healed deformities in the left ribs. Marked lower cervical spine degenerative changes. Mild thoracic spondylosis. No new focal osseous lesions. Symmetric prominent gynecomastia. Chronic large decubitus ulcer in lateral right back (series 3/image 40). Adjacent sclerosis and well corticated irregularity in the posterior right eighth through tenth ribs, unchanged since 07/19/2017 chest CT, compatible with chronic osteomyelitis. No acute osseous erosions. CT ABDOMEN PELVIS FINDINGS Examination limited by streak artifact from the patient's upper extremities. Hepatobiliary: Normal liver with no liver mass. Normal gallbladder with no radiopaque cholelithiasis. No intrahepatic biliary ductal dilatation. Stable CBD diameter 6 mm, top-normal. Pancreas: Normal, with no mass or duct dilation. Spleen: Normal size. No mass. Adrenals/Urinary Tract: Stable appearance of the adrenal glands without discrete adrenal nodules. Hypodense 1.1 cm interpolar left renal cortical lesion (series 8/image 18), stable in size since 03/10/2016 CT, probably benign. No new renal lesions. No hydronephrosis. Suprapubic Foley catheter is in place. Collapsed bladder with no acute abnormality. Stomach/Bowel: Small hiatal hernia. Otherwise normal nondistended stomach. Normal caliber small bowel with no small bowel wall thickening. Normal appendix. Status post end sigmoid colostomy in the ventral left abdominal wall. Stable moderate stool throughout Hartmann's pouch. No large bowel wall thickening or acute pericolonic fat stranding. Moderate colonic stool. Vascular/Lymphatic: Normal caliber abdominal aorta. Patent portal, splenic, hepatic and renal veins. No pathologically enlarged abdominal nodes. Bilateral inguinal adenopathy up to 1.6 cm on the right (series 3/image 130), unchanged. Bilateral external iliac lymphadenopathy up to 1.9 cm on the right (series 3/image 107), unchanged.  Reproductive: Normal size prostate. Other: No pneumoperitoneum, ascites or focal fluid collection. Musculoskeletal: Stable large sacral and bilateral ischial decubitus ulcers. 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  No appreciable acute osseous erosions. IMPRESSION: 1. Small hiatal hernia. Chronic circumferential wall thickening in the lower thoracic esophagus, nonspecific, not definitely changed, which could be due to reflux esophagitis, Barrett's esophagus and/or neoplasm. 2. No evidence of bowel obstruction or acute bowel inflammation. Stable postsurgical changes from end sigmoid colostomy. 3. Expansile lytic 6 cm mass in the posterior right chest wall involving the posterior right fourth and fifth ribs and inferior right scapula, indeterminate for neoplasm, stable since 07/19/2017 chest CT. 4. Stable chronic large right lateral back decubitus ulcer with chronic osteomyelitis in the right posterior eighth through tenth ribs. 5. Stable large sacral and bilateral ischial decubitus ulcers with extensive chronic osteomyelitis and fragmentation throughout the bilateral pelvic girdle. 6. Mild-to-moderate atelectasis versus postinfectious/postinflammatory scarring in the bilateral lower lung lobes. 7. Chronic mediastinal and bilateral pelvic adenopathy, unchanged, more likely reactive. Electronically Signed   By: Ilona Sorrel M.D.   On: 11/18/2017 14:33    Anti-infectives: Anti-infectives (From admission, onward)   Start     Dose/Rate Route Frequency Ordered Stop   11/19/17 1400  ceFEPIme (MAXIPIME) 1 g in sodium chloride 0.9 % 100 mL IVPB     1 g 200 mL/hr over 30 Minutes Intravenous Every 8 hours 11/19/17 1032     11/19/17 1030  sulfamethoxazole-trimethoprim (BACTRIM DS,SEPTRA DS) 800-160 MG per tablet 1 tablet  Status:  Discontinued     1 tablet Oral Every 12 hours 11/19/17 1018 11/19/17 1032   11/18/17 1100   DAPTOmycin (CUBICIN) 500 mg in sodium chloride 0.9 % IVPB     500 mg 220 mL/hr over 30 Minutes Intravenous Every 24 hours 11/18/17 1023     11/18/17 0930  metroNIDAZOLE (FLAGYL) tablet 500 mg  Status:  Discontinued     500 mg Oral Every 8 hours 11/18/17 0929 11/18/17 1614   11/18/17 0600  ceFEPIme (MAXIPIME) 1 g in sodium chloride 0.9 % 100 mL IVPB  Status:  Discontinued     1 g 200 mL/hr over 30 Minutes Intravenous Every 8 hours 11/17/17 2334 11/19/17 1018   11/17/17 2345  ceFEPIme (MAXIPIME) 2 g in sodium chloride 0.9 % 100 mL IVPB     2 g 200 mL/hr over 30 Minutes Intravenous  Once 11/17/17 2333 11/18/17 0700   11/17/17 1615  cefTRIAXone (ROCEPHIN) 1 g in sodium chloride 0.9 % 100 mL IVPB     1 g 200 mL/hr over 30 Minutes Intravenous  Once 11/17/17 1601 11/17/17 1716   11/17/17 0000  cephALEXin (KEFLEX) 500 MG capsule     500 mg Oral 4 times daily 11/17/17 1630 11/27/17 2359      Assessment/Plan Right posterior chest pressure ulcer (unstageable)  - no role for surgical debridement at this time. I think wound will continue to clean up with BID santyl and wet-to-dry dressing changes. Sacral pressure ulcer (stage 4) - continue current care  S/P diverting colostomy 2007    LOS: 3 days    Jill Alexanders , [facility] 11/20/2017, 7:55 AM Pager: [phone] Consults: [phone]  Agree with above.  Alphonsa Overall, MD, [facility] Pager: [phone] Office phone:  [phone]

## 2017-11-20 NOTE — Care Management Note (Signed)
Case Management Note  Patient Details  Name: CHRISANGEL ESKENAZI MRN: [id number] Date of Birth: 1966/02/27  Subjective/Objective:    From home with sister  and family, presents with acute lower uti, has hx of quad, chronic foley, colostomy, pressure ulcers, he is active with [facility] for [facility]                Action/Plan: NCM will follow for dc needs.   Expected Discharge Date:                  Expected Discharge Plan:  [place]  In-House Referral:     Discharge planning Services  CM Consult  Post Acute Care Choice:  Resumption of Svcs/PTA Provider Choice offered to:     DME Arranged:    DME Agency:     HH Arranged:  RN [place] Agency:  [place]  Status of Service:  In process, will continue to follow  If discussed at Long Length of Stay Meetings, dates discussed:    Additional Comments:  [name], RN 11/20/2017, 8:56 AM

## 2017-11-20 NOTE — Progress Notes (Signed)
[name]  Date of Admission:  11/17/2017     Total days of antibiotics 4  Day 4 Cefepime   Day 3 Daptomycin          ASSESSMENT: [name] [name] is a 52 y.o. male with paraplegia following C5 fx, s/p colostomy, neurogenic bladder s/p suprapubic catheter, sacral osteo (bone cx in 2018 with e coli, proteus mirabilis and pseudomas) and pressure ulcers no torso admitted for nausea, constipation and fevers. Urine culture (+) e coli --> treated with cefepime currently. In the past has received prolonged IV antibiotics through port-a-cath for right upper back decub. Chest/sacral CT indicating chronic osteomyelitis changes involving 8  10th ribs on the right and pelvic throughout pelvic girdle - there is also mention of 6cm lytic mass in the posterior right chest wall involving 4th-5th ribs and inferior right scapula -->stable since 3/619, indeterminate for neoplasm. Oncology does not feel this is neoplasm.   He continues to have fever with temps 100 - 103 F over the last 24h. Surgery has seen him this AM and do not feel debridement is necessary at this time. All changes on CT scans seem to be chronic in nature regarding osteomyelitis and clinically wounds do not appear to be infected. Possible UTI should be treated with current cefepime; no ESBL detected but he has been exposed to many different antibiotics in the past considering his osteomyelitis, wounds and urine cultures. He is diminished on the right lung base/middle lobe and demonstrated cough effort is poor - with vomiting recently suspect aspiration component. Would check chest xray to further evaluate. May need to change to to piperacillin-tazobactam to cover for possible aspiration pneumonia/pneumonitis and ecoli UTI (pip-tazo MIC 16). With wounds appearing clinically uninfected not certain we need the daptomycin.   PLAN:  1. Would check chest xray with ongoing fevers and new cough complaint  2. Continue  cefepime/daptomycin for now pending #1.   Principal Problem:   Fever Active Problems:   Quadriplegia ([place])   Sacral decubitus ulcer, stage IV ([name]   OSA on CPAP   Iron deficiency anemia   Non-intractable vomiting with nausea   Osteomyelitis of thoracic region [facility])   Gastroparesis   Acute lower UTI   UTI (urinary tract infection)   . baclofen  20 mg Oral QID  . Chlorhexidine Gluconate Cloth  6 each Topical Q0600  . collagenase   Topical Daily  . famotidine  20 mg Oral BID  . ferrous sulfate  325 mg Oral Q breakfast  . fesoterodine  8 mg Oral Daily  . magnesium oxide  400 mg Oral BID  . metoCLOPramide  10 mg Oral Q6H  . multivitamin with minerals  1 tablet Oral q morning - 10a  . nutrition supplement (JUVEN)  1 packet Oral BID BM  . pantoprazole  40 mg Oral BID  . polyethylene glycol  17 g Oral Daily  . sodium chloride flush  3 mL Intravenous Q12H  . sucralfate  1 g Oral QID  . vitamin C  500 mg Oral Daily  . zinc sulfate  220 mg Oral BID    SUBJECTIVE: Still having some chills/fevers. He did eat something yesterday and held that down. Has been having chills since Thursday of last week. New cough over the last 24h per his account. Has some phlegm he is having trouble coughing up.    Allergies  Allergen Reactions  . Feraheme [Ferumoxytol] Other (See Comments)  SYNCOPE  . Ditropan [Oxybutynin] Other (See Comments)    Hallucinations   . Vancomycin Other (See Comments)    ARF 05-2016 -- affects kidneys     OBJECTIVE: Vitals:   11/19/17 2109 11/19/17 2303 11/20/17 0500 11/20/17 0916  BP:  131/73  140/66  Pulse:  94    Resp:  (!) 27    Temp: 100 F (37.8 C) 99.6 F (37.6 C)  100 F (37.8 C)  TempSrc: Oral Oral  Oral  SpO2:  99%    Weight:   204 lb 12.9 oz (92.9 kg)   Height:       Body mass index is 27.78 kg/m.  Physical Exam  Constitutional: He is oriented to person, place, and time. No distress.  Resting in bed.   HENT:  Mouth/Throat: Oropharynx is  clear and moist.  Eyes: Pupils are equal, round, and reactive to light. No scleral icterus.  Cardiovascular: Normal rate and regular rhythm.  No murmur heard. Pulmonary/Chest: Effort normal. No respiratory distress.  Diminished RLL/RML. Poor cough effort demonstrated.   Abdominal: Soft. Bowel sounds are normal. There is no tenderness. There is no guarding.  Semi solid stool in colostomy bag   Musculoskeletal: He exhibits no edema.  Paraplegic. Gross upper extremity movement.  Sacral and right thoracic wounds evaluated with pictures in the chart today as they were just dressed.   Lymphadenopathy:    He has no cervical adenopathy.  Neurological: He is alert and oriented to person, place, and time.  Skin: Skin is warm and dry. Capillary refill takes less than 2 seconds.  Psychiatric: He has a normal mood and affect. Judgment normal.  Nursing note and vitals reviewed.   Lab Results Lab Results  Component Value Date   WBC 10.5 11/20/2017   HGB 8.4 (L) 11/20/2017   HCT 28.1 (L) 11/20/2017   MCV 79.4 11/20/2017   PLT 319 11/20/2017    Lab Results  Component Value Date   CREATININE 0.42 (L) 11/20/2017   BUN 10 11/20/2017   NA 139 11/20/2017   K 4.0 11/20/2017   CL 107 11/20/2017   CO2 27 11/20/2017    Lab Results  Component Value Date   ALT 11 11/17/2017   AST 13 (L) 11/17/2017   ALKPHOS 62 11/17/2017   BILITOT 0.6 11/17/2017     Microbiology: Recent Results (from the past 240 hour(s))  Urine culture     Status: Abnormal   Collection Time: 11/17/17  3:18 PM  Result Value Ref Range Status   Specimen Description URINE, CATHETERIZED  Final   Special Requests   Final    NONE Performed at [facility], [address] [place], [place] [address]    Culture >=100,000 COLONIES/mL ESCHERICHIA COLI (A)  Final   Report Status 11/19/2017 FINAL  Final   Organism ID, Bacteria ESCHERICHIA COLI (A)  Final      Susceptibility   Escherichia coli - MIC*    AMPICILLIN >=32  RESISTANT Resistant     CEFAZOLIN >=64 RESISTANT Resistant     CEFEPIME <=1 SENSITIVE Sensitive     CEFTRIAXONE 16 INTERMEDIATE Intermediate     CIPROFLOXACIN >=4 RESISTANT Resistant     GENTAMICIN <=1 SENSITIVE Sensitive     IMIPENEM 0.5 SENSITIVE Sensitive     NITROFURANTOIN <=16 SENSITIVE Sensitive     TRIMETH/SULFA <=20 SENSITIVE Sensitive     AMPICILLIN/SULBACTAM >=32 RESISTANT Resistant     PIP/TAZO 16 SENSITIVE Sensitive     Extended ESBL NEGATIVE Sensitive     * >=  100,000 COLONIES/mL ESCHERICHIA COLI  MRSA PCR Screening     Status: Abnormal   Collection Time: 11/18/17  1:17 AM  Result Value Ref Range Status   MRSA by PCR POSITIVE (A) NEGATIVE Final    Comment:        The GeneXpert MRSA Assay (FDA approved for NASAL specimens only), is one component of a comprehensive MRSA colonization surveillance program. It is not intended to diagnose MRSA infection nor to guide or monitor treatment for MRSA infections. RESULT CALLED TO, READ BACK BY AND VERIFIED WITH: E MCDUFFIE RN 11/18/17 0700 JDW   Culture, blood (x 2)     Status: None (Preliminary result)   Collection Time: 11/18/17  3:01 AM  Result Value Ref Range Status   Specimen Description BLOOD RIGHT WRIST  Final   Special Requests   Final    BOTTLES DRAWN AEROBIC ONLY Blood Culture results may not be optimal due to an inadequate volume of blood received in culture bottles   Culture   Final    NO GROWTH 1 DAY Performed at [facility], [place] [address], [place], [place] [address]    Report Status PENDING  Incomplete  Culture, blood (x 2)     Status: None (Preliminary result)   Collection Time: 11/18/17  3:17 AM  Result Value Ref Range Status   Specimen Description BLOOD RIGHT HAND  Final   Special Requests   Final    BOTTLES DRAWN AEROBIC ONLY Blood Culture results may not be optimal due to an inadequate volume of blood received in culture bottles   Culture   Final    NO GROWTH 1 DAY Performed at [facility], [place] [address], [place], [place] [address]    Report Status PENDING  Incomplete    Somtochukwu Woollard, MSN, NP-C [facility] [place] Cell: [phone] Pager: [phone]  11/20/2017  11:13 AM

## 2017-11-20 NOTE — Consult Note (Signed)
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 endorsing black tarry stools and the tarry aspect would not be consistent with oral iron. He has had multiple EGDs in the past that have illustrated erosive esophagitis and gastritis with ulcers. In June when he presented to the ED his BUN was elevated compared to baseline but it now appears to have returned to normal.  - Given then mixed picture would recommend proceeding with EGD  - Patient will eventually need colonoscopy as well - Clear liquid diet  - Continue pantoprazole 40 mg BID   LOS: 3 days   Ina Homes  MD 11/20/2017, 8:23 AM  Contact #  [phone]

## 2017-11-21 ENCOUNTER — Encounter (HOSPITAL_COMMUNITY): Payer: Self-pay

## 2017-11-21 ENCOUNTER — Inpatient Hospital Stay (HOSPITAL_COMMUNITY): Payer: Medicare Other | Admitting: Certified Registered"

## 2017-11-21 ENCOUNTER — Encounter (HOSPITAL_COMMUNITY)
Admission: EM | Disposition: A | Discharge status: Home Health | Payer: Self-pay | Source: Home / Self Care | Attending: Family Medicine

## 2017-11-21 DIAGNOSIS — G8220 Paraplegia, unspecified: Secondary | ICD-10-CM

## 2017-11-21 DIAGNOSIS — M4628 Osteomyelitis of vertebra, sacral and sacrococcygeal region: Secondary | ICD-10-CM

## 2017-11-21 DIAGNOSIS — Z960 Presence of urogenital implants: Secondary | ICD-10-CM

## 2017-11-21 DIAGNOSIS — Z881 Allergy status to other antibiotic agents status: Secondary | ICD-10-CM

## 2017-11-21 DIAGNOSIS — L89159 Pressure ulcer of sacral region, unspecified stage: Secondary | ICD-10-CM

## 2017-11-21 DIAGNOSIS — Z933 Colostomy status: Secondary | ICD-10-CM

## 2017-11-21 DIAGNOSIS — M4624 Osteomyelitis of vertebra, thoracic region: Secondary | ICD-10-CM

## 2017-11-21 DIAGNOSIS — N319 Neuromuscular dysfunction of bladder, unspecified: Secondary | ICD-10-CM

## 2017-11-21 DIAGNOSIS — Z888 Allergy status to other drugs, medicaments and biological substances status: Secondary | ICD-10-CM

## 2017-11-21 DIAGNOSIS — R509 Fever, unspecified: Secondary | ICD-10-CM

## 2017-11-21 HISTORY — PX: ESOPHAGOGASTRODUODENOSCOPY (EGD) WITH PROPOFOL: SHX5813

## 2017-11-21 HISTORY — PX: BIOPSY: SHX5522

## 2017-11-21 LAB — CBC
HEMATOCRIT: 26.5 % — AB (ref 39.0–52.0)
Hemoglobin: 7.9 g/dL — ABNORMAL LOW (ref 13.0–17.0)
MCH: 24.4 pg — ABNORMAL LOW (ref 26.0–34.0)
MCHC: 29.8 g/dL — ABNORMAL LOW (ref 30.0–36.0)
MCV: 81.8 fL (ref 78.0–100.0)
Platelets: 326 10*3/uL (ref 150–400)
RBC: 3.24 MIL/uL — ABNORMAL LOW (ref 4.22–5.81)
RDW: 17 % — ABNORMAL HIGH (ref 11.5–15.5)
WBC: 11.1 10*3/uL — AB (ref 4.0–10.5)

## 2017-11-21 LAB — BASIC METABOLIC PANEL
Anion gap: 8 (ref 5–15)
BUN: 8 mg/dL (ref 6–20)
CHLORIDE: 102 mmol/L (ref 98–111)
CO2: 27 mmol/L (ref 22–32)
Calcium: 8.2 mg/dL — ABNORMAL LOW (ref 8.9–10.3)
Creatinine, Ser: 0.49 mg/dL — ABNORMAL LOW (ref 0.61–1.24)
GFR calc non Af Amer: >60 mL/min (ref >60)
Glucose, Bld: 112 mg/dL — ABNORMAL HIGH (ref 70–99)
POTASSIUM: 3.6 mmol/L (ref 3.5–5.1)
Sodium: 137 mmol/L (ref 135–145)

## 2017-11-21 LAB — MAGNESIUM: Magnesium: 1.6 mg/dL — ABNORMAL LOW (ref 1.7–2.4)

## 2017-11-21 SURGERY — ESOPHAGOGASTRODUODENOSCOPY (EGD) WITH PROPOFOL
Anesthesia: Monitor Anesthesia Care

## 2017-11-21 MED ORDER — LIDOCAINE 2% (20 MG/ML) 5 ML SYRINGE
INTRAMUSCULAR | Status: DC | PRN
  Administered 2017-11-21: 30 mg via INTRAVENOUS

## 2017-11-21 MED ORDER — LACTATED RINGERS IV SOLN
INTRAVENOUS | Status: DC
  Administered 2017-11-21: 08:00:00 via INTRAVENOUS

## 2017-11-21 MED ORDER — LACTATED RINGERS IV SOLN
INTRAVENOUS | Status: DC | PRN
  Administered 2017-11-21: 08:00:00 via INTRAVENOUS

## 2017-11-21 MED ORDER — SODIUM CHLORIDE 0.9 % IV SOLN: INTRAVENOUS | Status: DC

## 2017-11-21 MED ORDER — GLYCOPYRROLATE PF 0.2 MG/ML IJ SOSY
PREFILLED_SYRINGE | INTRAMUSCULAR | Status: DC | PRN
  Administered 2017-11-21: .1 mg via INTRAVENOUS

## 2017-11-21 MED ORDER — PROPOFOL 10 MG/ML IV BOLUS
INTRAVENOUS | Status: DC | PRN
  Administered 2017-11-21: 30 mg via INTRAVENOUS
  Administered 2017-11-21: 20 mg via INTRAVENOUS
  Administered 2017-11-21 (×2): 40 mg via INTRAVENOUS
  Administered 2017-11-21: 70 mg via INTRAVENOUS

## 2017-11-21 MED ORDER — MAGNESIUM SULFATE 2 GM/50ML IV SOLN
2.0000 g | Freq: Once | INTRAVENOUS | Status: AC
  Administered 2017-11-21: 2 g via INTRAVENOUS
  Filled 2017-11-21: qty 50

## 2017-11-21 MED ORDER — PHENYLEPHRINE 40 MCG/ML (10ML) SYRINGE FOR IV PUSH (FOR BLOOD PRESSURE SUPPORT)
PREFILLED_SYRINGE | INTRAVENOUS | Status: DC | PRN
  Administered 2017-11-21: 80 ug via INTRAVENOUS

## 2017-11-21 SURGICAL SUPPLY — 15 items

## 2017-11-21 NOTE — Anesthesia Postprocedure Evaluation (Signed)
Anesthesia Post Note  Patient: [name] [name]  Procedure(s) Performed: ESOPHAGOGASTRODUODENOSCOPY (EGD) WITH PROPOFOL (N/A ) BIOPSY     Patient location during evaluation: PACU Anesthesia Type: MAC Level of consciousness: awake and alert Pain management: pain level controlled Vital Signs Assessment: post-procedure vital signs reviewed and stable Respiratory status: spontaneous breathing Cardiovascular status: stable Anesthetic complications: no    Last Vitals:  Vitals:   11/21/17 1127 11/21/17 1610  BP: 98/65 (!) 103/55  Pulse: 89 84  Resp: (!) 23 20  Temp: 37.2 C 37.3 C  SpO2: 99% 98%    Last Pain:  Vitals:   11/21/17 1610  TempSrc: Oral  PainSc:                  Nolon Nations

## 2017-11-21 NOTE — Progress Notes (Signed)
Pharmacy Antibiotic Note  [name] [name] is a 52 y.o. male admitted on 11/17/2017 with sepsis and decubitus ulcer.  Pharmacy has been consulted for daptomycin and cefepime dosing.  On abx for possible infected decubitus ulcers / osteo and possible UTI. Sx following and may require debridement this week. Also now ruling out a new PNA, may need to adjust regimen if so. Fevers trending down, WBC 11.1.  CXR does show small infiltrate.  Plan: Dapto 500mg  IV Q24h Continue cefepime 1g IV Q8h Monitor clinical picture, renal function, CK weekly F/U ID recs, C&S, abx deescalation / LOT  Height: 6' (182.9 cm) Weight: 204 lb 9.4 oz (92.8 kg) IBW/kg (Calculated) : 77.6  Temp (24hrs), Avg:99.3 F (37.4 C), Min:98.2 F (36.8 C), Max:100 F (37.8 C)  Recent Labs  Lab 11/17/17 1518 11/18/17 0500 11/18/17 0516 11/19/17 0552 11/20/17 0410 11/21/17 0535  WBC 11.4* 9.0  --  7.7 10.5 11.1*  CREATININE 0.49* 0.56*  --  0.44* 0.42* 0.49*  LATICACIDVEN  --   --  0.6  --   --   --     Estimated Creatinine Clearance: 119.9 mL/min (A) (by C-G formula based on SCr of 0.49 mg/dL (L)).    Allergies  Allergen Reactions  . Feraheme [Ferumoxytol] Other (See Comments)    SYNCOPE  . Ditropan [Oxybutynin] Other (See Comments)    Hallucinations   . Vancomycin Other (See Comments)    ARF 05-2016 -- affects kidneys     Antimicrobials this admission: Cefepime 7/5 >>  Daptomycin 7/6 >> Metronidazole 7/6 >>  Dose adjustments this admission: none  Microbiology results: 7/6 BCx: ngtd 7/6 UCx: E. Coli (S to cefepime) 7/6 MRSA PCR: positive  Thank you for allowing pharmacy to be a part of this patient's care.  Reginia Naas 11/21/2017 7:33 AM

## 2017-11-21 NOTE — Progress Notes (Signed)
RT placed patient on CPAP. Patient tolerating well at this time. RT will monitor as needed. 

## 2017-11-21 NOTE — Progress Notes (Signed)
[facility]  Date of Admission:  11/17/2017     Total days of antibiotics 5  Day 5 Cefepime   Day 4 Daptomycin          ASSESSMENT: [name] [name] is a 52 y.o. male with paraplegia following C5 fx, s/p colostomy, neurogenic bladder s/p suprapubic catheter, sacral osteo (bone cx in 2018 with e coli, proteus mirabilis and pseudomas) and pressure ulcers no torso admitted for nausea, constipation and fevers. Urine culture (+) e coli (s-cefepime). In the past has received prolonged IV antibiotics through port-a-cath for right upper back decub and sacral osteo. Chest/sacral CT indicating chronic osteomyelitis changes involving 8 -10th ribs on the right and pelvic throughout pelvic girdle - there is also mention of 6cm lytic mass in the posterior right chest wall involving 4th-5th ribs and inferior right scapula -->stable since 3/619, indeterminate for neoplasm. Oncology does not feel this is neoplasm.   Fevers have resolved. In further discussion seems that there has been growing concern over soft tissue wound infection of the scapula - no evidence on scans that suggest a worsening component. Chest X-ray without infiltrate and cough has resolved. It is difficult to tell if infection is caused by urinary source or soft tissue infection. Will continue cefepime and daptomycin and make further recommendations about duration of therapy - would not anticipate long course. Likely needs more frequent trips to wound clinic to debride upper back wound to keep the wound bed free of slough/necrotic tissue.   PLAN:  1. Continue daptomycin / cefepime   Principal Problem:   Fever Active Problems:   Quadriplegia ([name]   Sacral decubitus ulcer, stage IV ([name]   OSA on CPAP   Iron deficiency anemia   Non-intractable vomiting with nausea   Osteomyelitis of thoracic region [facility])   Gastroparesis   Acute lower UTI   UTI (urinary tract infection)   . baclofen  20 mg Oral QID    . Chlorhexidine Gluconate Cloth  6 each Topical Q0600  . collagenase   Topical Daily  . famotidine  20 mg Oral BID  . ferrous sulfate  325 mg Oral Q breakfast  . fesoterodine  8 mg Oral Daily  . magnesium oxide  400 mg Oral BID  . metoCLOPramide  10 mg Oral Q6H  . multivitamin with minerals  1 tablet Oral q morning - 10a  . nutrition supplement (JUVEN)  1 packet Oral BID BM  . pantoprazole  40 mg Oral BID  . polyethylene glycol  17 g Oral Daily  . sodium chloride flush  3 mL Intravenous Q12H  . sucralfate  1 g Oral QID  . vitamin C  500 mg Oral Daily  . zinc sulfate  220 mg Oral BID    SUBJECTIVE: Fevers have resolved as has his cough. He tells me that his sister has growing concerns over the antibiotic use/resistant bacteria and upper back wound. This had worsened drainage from her account and he tells me that Dr. Dellia Nims with [facility] has actually done some debridements in the office lately.   Allergies  Allergen Reactions  . Feraheme [Ferumoxytol] Other (See Comments)    SYNCOPE  . Ditropan [Oxybutynin] Other (See Comments)    Hallucinations   . Vancomycin Other (See Comments)    ARF 05-2016 -- affects kidneys     OBJECTIVE: Vitals:   11/21/17 0928 11/21/17 0945 11/21/17 1127 11/21/17 1610  BP: 108/77 (!) 90/47  98/65 (!) 103/55  Pulse: 95 89 89 84  Resp: 17 20 (!) 23 20  Temp:   98.9 F (37.2 C) 99.1 F (37.3 C)  TempSrc:   Oral Oral  SpO2: 98% 96% 99% 98%  Weight:      Height:       Body mass index is 27.75 kg/m.  Physical Exam  Constitutional: He is oriented to person, place, and time. No distress.  Resting in bed.   HENT:  Mouth/Throat: Oropharynx is clear and moist.  Eyes: Pupils are equal, round, and reactive to light. No scleral icterus.  Cardiovascular: Normal rate and regular rhythm.  No murmur heard. Pulmonary/Chest: Effort normal and breath sounds normal. No respiratory distress.  No cough today   Abdominal: Soft. Bowel sounds are  normal. There is no tenderness. There is no guarding.  Semi solid stool in colostomy bag   Musculoskeletal: He exhibits no edema.  Paraplegic. Gross upper extremity movement.  Sacral and right thoracic wounds not assessed today.   Lymphadenopathy:    He has no cervical adenopathy.  Neurological: He is alert and oriented to person, place, and time.  Skin: Skin is warm and dry. Capillary refill takes less than 2 seconds.  Psychiatric: He has a normal mood and affect. Judgment normal.  Nursing note and vitals reviewed.   Lab Results Lab Results  Component Value Date   WBC 11.1 (H) 11/21/2017   HGB 7.9 (L) 11/21/2017   HCT 26.5 (L) 11/21/2017   MCV 81.8 11/21/2017   PLT 326 11/21/2017    Lab Results  Component Value Date   CREATININE 0.49 (L) 11/21/2017   BUN 8 11/21/2017   NA 137 11/21/2017   K 3.6 11/21/2017   CL 102 11/21/2017   CO2 27 11/21/2017    Lab Results  Component Value Date   ALT 11 11/17/2017   AST 13 (L) 11/17/2017   ALKPHOS 62 11/17/2017   BILITOT 0.6 11/17/2017     Microbiology: Recent Results (from the past 240 hour(s))  Urine culture     Status: Abnormal   Collection Time: 11/17/17  3:18 PM  Result Value Ref Range Status   Specimen Description URINE, CATHETERIZED  Final   Special Requests   Final    NONE Performed at [facility], [address], [place], [place] [address]    Culture >=100,000 COLONIES/mL ESCHERICHIA COLI (A)  Final   Report Status 11/19/2017 FINAL  Final   Organism ID, Bacteria ESCHERICHIA COLI (A)  Final      Susceptibility   Escherichia coli - MIC*    AMPICILLIN >=32 RESISTANT Resistant     CEFAZOLIN >=64 RESISTANT Resistant     CEFEPIME <=1 SENSITIVE Sensitive     CEFTRIAXONE 16 INTERMEDIATE Intermediate     CIPROFLOXACIN >=4 RESISTANT Resistant     GENTAMICIN <=1 SENSITIVE Sensitive     IMIPENEM 0.5 SENSITIVE Sensitive     NITROFURANTOIN <=16 SENSITIVE Sensitive     TRIMETH/SULFA <=20 SENSITIVE Sensitive      AMPICILLIN/SULBACTAM >=32 RESISTANT Resistant     PIP/TAZO 16 SENSITIVE Sensitive     Extended ESBL NEGATIVE Sensitive     * >=100,000 COLONIES/mL ESCHERICHIA COLI  MRSA PCR Screening     Status: Abnormal   Collection Time: 11/18/17  1:17 AM  Result Value Ref Range Status   MRSA by PCR POSITIVE (A) NEGATIVE Final    Comment:        The GeneXpert MRSA Assay (FDA approved for NASAL  specimens only), is one component of a comprehensive MRSA colonization surveillance program. It is not intended to diagnose MRSA infection nor to guide or monitor treatment for MRSA infections. RESULT CALLED TO, READ BACK BY AND VERIFIED WITH: E MCDUFFIE RN 11/18/17 0700 JDW   Culture, blood (x 2)     Status: None (Preliminary result)   Collection Time: 11/18/17  3:01 AM  Result Value Ref Range Status   Specimen Description BLOOD RIGHT WRIST  Final   Special Requests   Final    BOTTLES DRAWN AEROBIC ONLY Blood Culture results may not be optimal due to an inadequate volume of blood received in culture bottles   Culture   Final    NO GROWTH 3 DAYS Performed at [facility], [place] [address], [place], [place] [address]    Report Status PENDING  Incomplete  Culture, blood (x 2)     Status: None (Preliminary result)   Collection Time: 11/18/17  3:17 AM  Result Value Ref Range Status   Specimen Description BLOOD RIGHT HAND  Final   Special Requests   Final    BOTTLES DRAWN AEROBIC ONLY Blood Culture results may not be optimal due to an inadequate volume of blood received in culture bottles   Culture   Final    NO GROWTH 3 DAYS Performed at [facility], [place] [address], [place], [place] [address]    Report Status PENDING  Incomplete    Dejohn Ibarra, MSN, NP-C [facility] [place] Cell: [phone] Pager: [phone]  11/21/2017  5:45 PM
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
## 2017-11-21 NOTE — Transfer of Care (Signed)
Immediate Anesthesia Transfer of Care Note  Patient: [name] [name]  Procedure(s) Performed: ESOPHAGOGASTRODUODENOSCOPY (EGD) WITH PROPOFOL (N/A ) BIOPSY  Patient Location: Endoscopy Unit  Anesthesia Type:MAC  Level of Consciousness: awake and patient cooperative  Airway & Oxygen Therapy: Patient Spontanous Breathing and Patient connected to nasal cannula oxygen  Post-op Assessment: Report given to RN and Post -op Vital signs reviewed and stable  Post vital signs: Reviewed and stable  Last Vitals:  Vitals Value Taken Time  BP    Temp    Pulse 87 11/21/2017  9:13 AM  Resp 16 11/21/2017  9:13 AM  SpO2 95 % 11/21/2017  9:13 AM  Vitals shown include unvalidated device data.  Last Pain:  Vitals:   11/21/17 0821  TempSrc: Oral  PainSc:          Complications: No apparent anesthesia complications

## 2017-11-21 NOTE — Op Note (Signed)
[facility] Patient Name: [name] [name] Procedure Date : 11/21/2017 MRN: [id number] Attending MD: Otis Brace , MD Date of Birth: [birth year] CSN: [id number] Age: 52 Admit Type: Inpatient Procedure:                Upper GI endoscopy Indications:              Heme positive stool, Melena Providers:                Otis Brace, MD, Burtis Junes, RN, Tinnie Gens,                            Technician, Vania Rea, CRNA Referring MD:              Medicines:                Sedation Administered by an Anesthesia Professional Complications:            No immediate complications. Estimated Blood Loss:     Estimated blood loss was minimal. Procedure:                Pre-Anesthesia Assessment:                           - Prior to the procedure, a History and Physical                            was performed, and patient medications and                            allergies were reviewed. The patient's tolerance of                            previous anesthesia was also reviewed. The risks                            and benefits of the procedure and the sedation                            options and risks were discussed with the patient.                            All questions were answered, and informed consent                            was obtained. Prior Anticoagulants: The patient has                            taken no previous anticoagulant or antiplatelet                            agents. ASA Grade Assessment: IV - A patient with                            severe systemic disease that is a constant threat  to life. After reviewing the risks and benefits,                            the patient was deemed in satisfactory condition to                            undergo the procedure.                           After obtaining informed consent, the endoscope was                            passed under direct vision. Throughout the         procedure, the patient's blood pressure, pulse, and                            oxygen saturations were monitored continuously. The                            [id number] ) was introduced through the                            mouth, and advanced to the second part of duodenum.                            The upper GI endoscopy was accomplished without                            difficulty. The patient tolerated the procedure                            well. Scope In: Scope Out: Findings:      The Z-line was found 40 cm from the incisors.      LA Grade C (one or more mucosal breaks continuous between tops of 2 or       more mucosal folds, less than 75% circumference) esophagitis with no       bleeding was found in the distal esophagus. Biopsies were taken with a       cold forceps for histology.      There were esophageal mucosal changes suspicious for long-segment       Barrett's esophagus present in the mid esophagus and in the distal       esophagus. Biopsies were taken with a cold forceps for histology.      Diffuse mild inflammation was found in the entire examined stomach.       Biopsies were taken with a cold forceps for Helicobacter pylori testing.      The cardia and gastric fundus were normal on retroflexion.      The duodenal bulb was normal. there was underdistention of duodenal       sweep and D2 but no evidence of active bleeding seen in this location. Impression:               - Z-line, 40 cm from the incisors.                           -  LA Grade C non-erosive esophagitis. Biopsied.                           - Esophageal mucosal changes suspicious for                            long-segment Barrett's esophagus. Biopsied.                           - Chronic gastritis. Biopsied.                           - Normal duodenal bulb. Recommendation:           - Return patient to hospital ward for ongoing care.                           - Resume previous diet.                            - Continue present medications.                           - Await pathology results. Procedure Code(s):        --- Professional ---                           [phone], Esophagogastroduodenoscopy, flexible,                            transoral; with biopsy, single or multiple Diagnosis Code(s):        --- Professional ---                           K22.8, Other specified diseases of esophagus                           K20.8, Other esophagitis                           K29.50, Unspecified chronic gastritis without                            bleeding                           R19.5, Other fecal abnormalities                           K92.1, Melena (includes Hematochezia) CPT copyright 2017 American Medical Association. All rights reserved. The codes documented in this report are preliminary and upon coder review may  be revised to meet current compliance requirements. Otis Brace, MD Otis Brace, MD 11/21/2017 9:18:21 AM Number of Addenda: 0

## 2017-11-21 NOTE — Brief Op Note (Signed)
11/17/2017 - 11/21/2017  9:20 AM  PATIENT:  [name] [name]  52 y.o. male  PRE-OPERATIVE DIAGNOSIS:  Melena, occult blood positive stool  POST-OPERATIVE DIAGNOSIS:  esophagitis  PROCEDURE:  Procedure(s): ESOPHAGOGASTRODUODENOSCOPY (EGD) WITH PROPOFOL (N/A) BIOPSY  SURGEON:  Surgeon(s) and Role:    * Tee Richeson, MD - Primary  Findings ------------- - EGD showed LA grade C esophagitis. Possible Barrett's-appearing mucosa. Biopsies taken. It also showed gastritis. No evidence of active bleeding.  Recommendations ------------------------- - Continue PPI - Advance diet - Consider colonoscopy this admission depending on other clinical co morbidities - GI will follow  Otis Brace MD, FACP 11/21/2017, 9:21 AM  Contact #  [phone].

## 2017-11-21 NOTE — Progress Notes (Signed)
[name] [name] is a 52 y.o. male has presented with Melena. The various methods of treatment have been discussed with the patient and family. After consideration of risks, benefits and other options for treatment, the patient has consented to  Procedure(s):esophagus duodenoscopy as a surgical intervention .  The patient's history has been reviewed, patient examined, no change in status, stable for surgery.  I have reviewed the patient's chart and labs.  Questions were answered to the patient's satisfaction.   Risks (bleeding, infection, bowel perforation that could require surgery, sedation-related changes in cardiopulmonary systems), benefits (identification and possible treatment of source of symptoms, exclusion of certain causes of symptoms), and alternatives (watchful waiting, radiographic imaging studies, empiric medical treatment)  were explained to patient in detail and patient wishes to proceed.  Otis Brace MD, [place] 11/21/2017, 8:48 AM  Contact #  [phone]

## 2017-11-21 NOTE — Anesthesia Procedure Notes (Signed)
Procedure Name: MAC Date/Time: 11/21/2017 9:00 AM Performed by: Orlie Dakin, CRNA Pre-anesthesia Checklist: Emergency Drugs available, Patient identified, Suction available, Patient being monitored and Timeout performed Patient Re-evaluated:Patient Re-evaluated prior to induction Oxygen Delivery Method: Nasal cannula Preoxygenation: Pre-oxygenation with 100% oxygen Induction Type: IV induction

## 2017-11-21 NOTE — Care Management Important Message (Signed)
Important Message  Patient Details  Name: RYE DECOSTE MRN: [id number] Date of Birth: 03/05/1966   Medicare Important Message Given:  Yes    [name], RN 11/21/2017, 3:57 PM

## 2017-11-22 DIAGNOSIS — G8250 Quadriplegia, unspecified: Secondary | ICD-10-CM

## 2017-11-22 DIAGNOSIS — N390 Urinary tract infection, site not specified: Secondary | ICD-10-CM

## 2017-11-22 DIAGNOSIS — A419 Sepsis, unspecified organism: Secondary | ICD-10-CM

## 2017-11-22 DIAGNOSIS — B9620 Unspecified Escherichia coli [E. coli] as the cause of diseases classified elsewhere: Secondary | ICD-10-CM

## 2017-11-22 DIAGNOSIS — R222 Localized swelling, mass and lump, trunk: Secondary | ICD-10-CM

## 2017-11-22 DIAGNOSIS — D649 Anemia, unspecified: Secondary | ICD-10-CM

## 2017-11-22 LAB — CBC
HCT: 25.2 % — ABNORMAL LOW (ref 39.0–52.0)
Hemoglobin: 7.4 g/dL — ABNORMAL LOW (ref 13.0–17.0)
MCH: 23.9 pg — ABNORMAL LOW (ref 26.0–34.0)
MCHC: 29.4 g/dL — ABNORMAL LOW (ref 30.0–36.0)
MCV: 81.6 fL (ref 78.0–100.0)
PLATELETS: 303 10*3/uL (ref 150–400)
RBC: 3.09 MIL/uL — ABNORMAL LOW (ref 4.22–5.81)
RDW: 17 % — ABNORMAL HIGH (ref 11.5–15.5)
WBC: 9.9 10*3/uL (ref 4.0–10.5)

## 2017-11-22 LAB — BASIC METABOLIC PANEL
ANION GAP: 10 (ref 5–15)
BUN: 9 mg/dL (ref 6–20)
CALCIUM: 8.3 mg/dL — AB (ref 8.9–10.3)
CO2: 28 mmol/L (ref 22–32)
CREATININE: 0.46 mg/dL — AB (ref 0.61–1.24)
Chloride: 99 mmol/L (ref 98–111)
Glucose, Bld: 121 mg/dL — ABNORMAL HIGH (ref 70–99)
Potassium: 3.3 mmol/L — ABNORMAL LOW (ref 3.5–5.1)
Sodium: 137 mmol/L (ref 135–145)

## 2017-11-22 LAB — MAGNESIUM: MAGNESIUM: 1.7 mg/dL (ref 1.7–2.4)

## 2017-11-22 NOTE — Progress Notes (Signed)
[facility] Gastroenterology Progress Note  [name] [name] 52 y.o. 1965-12-12  CC:  Anemia/occult blood positive stool   Subjective: patient denies any GI symptoms. No bowel movement in last 2 days. Collecting gas in ostomy. Denies any blood in the stool.  ROS : negative for chest pain and SOB    Objective: Vital signs in last 24 hours: Vitals:   11/22/17 0018 11/22/17 0754  BP: 100/66 (!) 88/64  Pulse: 92 89  Resp: 16 18  Temp: 99.3 F (37.4 C) 98.4 F (36.9 C)  SpO2: 99% 100%    Physical Exam: GEN : A/O X3, NAD resting comfortably. Extremities contracted.  . Abdominal exam showed soft abdomen, nondistended, no tenderness, bowel sounds present. No peritoneal signs.left lower ostomy without any stool in the bag   Lab Results: Recent Labs    11/21/17 0535 11/22/17 0500  NA 137 137  K 3.6 3.3*  CL 102 99  CO2 27 28  GLUCOSE 112* 121*  BUN 8 9  CREATININE 0.49* 0.46*  CALCIUM 8.2* 8.3*  MG 1.6* 1.7   No results for input(s): AST, ALT, ALKPHOS, BILITOT, PROT, ALBUMIN in the last 72 hours. Recent Labs    11/21/17 0535 11/22/17 0500  WBC 11.1* 9.9  HGB 7.9* 7.4*  HCT 26.5* 25.2*  MCV 81.8 81.6  PLT 326 303   No results for input(s): LABPROT, INR in the last 72 hours.    Assessment/Plan: - Anemia with Hemoccult positive stool in patient with history of esophagitis and gastritis. No recent colonoscopy. Last colonoscopy more than 10 years ago according to patient. Not able to find reports. - Sepsis from infected decubitus ulcer/UTI - Quadriplegia - Status post colostomy because of decubitus ulcer in 2007.  Recommendations -------------------------- -  EGD again showed esophagitis and gastritis. Biopsies negative for H. Pylori as well as Candida - Offered inpatient colonoscopy but he declined at this time. He prefers outpatient colonoscopy but he is willing to get barium enema to rule out any colonic lesion as a cause for ongoing anemia and occult blood positive  stool. - orders placed. Repeat CBC in the morning. GI will follow     Otis Brace MD, [place] 11/22/2017, 2:32 PM  Contact #  [phone]

## 2017-11-22 NOTE — Progress Notes (Signed)
Patient was placed on CPAP via FFM on auto 6 cmH2O and 20 cmH2O max.  Patient is tolerating at this time.      11/22/17 0023  BiPAP/CPAP/SIPAP  BiPAP/CPAP/SIPAP Pt Type Adult  Mask Type Full face mask  Mask Size Medium  Respiratory Rate 20 breaths/min  EPAP  (6 min -18 max)  BiPAP/CPAP/SIPAP CPAP  Patient Home Equipment No  Auto Titrate Yes (6 min 18 max)  BiPAP/CPAP /SiPAP Vitals  Bilateral Breath Sounds Clear;Diminished
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
## 2017-11-22 NOTE — Progress Notes (Signed)
Patient was placed on CPAP on Auto of 6cmH2O and maximum  Of 18 cmH2O.  Patient tolerating at this time.     11/22/17 2339  BiPAP/CPAP/SIPAP  BiPAP/CPAP/SIPAP Pt Type Adult  Mask Type Full face mask  Mask Size Medium  EPAP  (18min-18 max)  Oxygen Percent 21 %  BiPAP/CPAP/SIPAP CPAP  Patient Home Equipment No  Auto Titrate Yes (21min max 18)

## 2017-11-22 NOTE — Progress Notes (Signed)
[place] for Infectious Disease  Date of Admission:  11/17/2017     Total days of antibiotics 6  Day 6 Cefepime   Day 5 Daptomycin          ASSESSMENT: [name] [name] is a 52 y.o. male with paraplegia following C5 fx, s/p colostomy, neurogenic bladder s/p suprapubic catheter, hx sacral osteo (bone cx in 2018 with e coli, proteus mirabilis and pseudomas) and pressure ulcers no torso admitted for nausea, constipation and fevers. Urine culture (+) e coli (s-cefepime). In the past has received prolonged IV antibiotics through port-a-cath for right upper back decub and sacral osteo. Chest/sacral CT indicating chronic osteomyelitis changes involving 8 -10th ribs on the right and pelvic throughout pelvic girdle - there is also mention of 6cm lytic mass in the posterior right chest wall involving 4th-5th ribs and inferior right scapula -->stable since 3/619, indeterminate for neoplasm. Oncology does not feel this is neoplasm.   Fevers have resolved and [name] [name] continues to feel like he is getting back to baseline. Discussed again multiple potential sources for fevers and difficulty differentiating which could be the culprit. Would recommend completing antibiotics after 7 days to target skin and soft tissue infection and UTI. No indication for anything longer for now.   Will get him in to see [name] in 4 weeks for follow up and re-evaluate how he is doing. Will sign off but happy to see [name] [name] back should there be a change in his condition prior to discharge.   PLAN:  1. Continue daptomycin / cefepime with last dose 11/23/17 2. OK to discharge from ID perspective after this dose    Principal Problem:   Fever Active Problems:   Quadriplegia ([name]   Sacral decubitus ulcer, stage IV ([name]   OSA on CPAP   Iron deficiency anemia   Non-intractable vomiting with nausea   Osteomyelitis of thoracic region [facility])   Gastroparesis   Acute lower UTI   UTI (urinary tract infection)   .  baclofen  20 mg Oral QID  . Chlorhexidine Gluconate Cloth  6 each Topical Q0600  . collagenase   Topical Daily  . famotidine  20 mg Oral BID  . ferrous sulfate  325 mg Oral Q breakfast  . fesoterodine  8 mg Oral Daily  . magnesium oxide  400 mg Oral BID  . metoCLOPramide  10 mg Oral Q6H  . multivitamin with minerals  1 tablet Oral q morning - 10a  . nutrition supplement (JUVEN)  1 packet Oral BID BM  . pantoprazole  40 mg Oral BID  . polyethylene glycol  17 g Oral Daily  . sodium chloride flush  3 mL Intravenous Q12H  . sucralfate  1 g Oral QID  . vitamin C  500 mg Oral Daily  . zinc sulfate  220 mg Oral BID    SUBJECTIVE: Fevers have resolved as has his cough. He tells me that his sister has growing concerns over the antibiotic use/resistant bacteria and upper back wound. This had worsened drainage from her account and he tells me that Dr. Dellia Nims with [facility] has actually done some debridements in the office lately.   Allergies  Allergen Reactions  . Feraheme [Ferumoxytol] Other (See Comments)    SYNCOPE  . Ditropan [Oxybutynin] Other (See Comments)    Hallucinations   . Vancomycin Other (See Comments)    ARF 05-2016 -- affects kidneys     OBJECTIVE: Vitals:  11/21/17 1610 11/22/17 0018 11/22/17 0500 11/22/17 0754  BP: (!) 103/55 100/66  (!) 88/64  Pulse: 84 92  89  Resp: 20 16  18   Temp: 99.1 F (37.3 C) 99.3 F (37.4 C)  98.4 F (36.9 C)  TempSrc: Oral Oral  Oral  SpO2: 98% 99%  100%  Weight:   201 lb 1 oz (91.2 kg)   Height:       Body mass index is 27.27 kg/m.  Physical Exam  Constitutional: He is oriented to person, place, and time. No distress.  Resting in bed.   HENT:  Mouth/Throat: Oropharynx is clear and moist.  Eyes: Pupils are equal, round, and reactive to light. No scleral icterus.  Cardiovascular: Normal rate and regular rhythm.  No murmur heard. Pulmonary/Chest: Effort normal and breath sounds normal. No respiratory distress.  No  cough today   Abdominal: Soft. Bowel sounds are normal. There is no tenderness. There is no guarding.  Musculoskeletal: He exhibits no edema.  Paraplegic. Gross upper extremity movement.  Sacral and right thoracic wounds not assessed today.   Lymphadenopathy:    He has no cervical adenopathy.  Neurological: He is alert and oriented to person, place, and time.  Skin: Skin is warm and dry. Capillary refill takes less than 2 seconds.  Psychiatric: He has a normal mood and affect. Judgment normal.  Nursing note and vitals reviewed.   Lab Results Lab Results  Component Value Date   WBC 9.9 11/22/2017   HGB 7.4 (L) 11/22/2017   HCT 25.2 (L) 11/22/2017   MCV 81.6 11/22/2017   PLT 303 11/22/2017    Lab Results  Component Value Date   CREATININE 0.46 (L) 11/22/2017   BUN 9 11/22/2017   NA 137 11/22/2017   K 3.3 (L) 11/22/2017   CL 99 11/22/2017   CO2 28 11/22/2017    Lab Results  Component Value Date   ALT 11 11/17/2017   AST 13 (L) 11/17/2017   ALKPHOS 62 11/17/2017   BILITOT 0.6 11/17/2017     Microbiology: Recent Results (from the past 240 hour(s))  Urine culture     Status: Abnormal   Collection Time: 11/17/17  3:18 PM  Result Value Ref Range Status   Specimen Description URINE, CATHETERIZED  Final   Special Requests   Final    NONE Performed at [facility], [address], [place], [place] [address]    Culture >=100,000 COLONIES/mL ESCHERICHIA COLI (A)  Final   Report Status 11/19/2017 FINAL  Final   Organism ID, Bacteria ESCHERICHIA COLI (A)  Final      Susceptibility   Escherichia coli - MIC*    AMPICILLIN >=32 RESISTANT Resistant     CEFAZOLIN >=64 RESISTANT Resistant     CEFEPIME <=1 SENSITIVE Sensitive     CEFTRIAXONE 16 INTERMEDIATE Intermediate     CIPROFLOXACIN >=4 RESISTANT Resistant     GENTAMICIN <=1 SENSITIVE Sensitive     IMIPENEM 0.5 SENSITIVE Sensitive     NITROFURANTOIN <=16 SENSITIVE Sensitive     TRIMETH/SULFA <=20 SENSITIVE Sensitive      AMPICILLIN/SULBACTAM >=32 RESISTANT Resistant     PIP/TAZO 16 SENSITIVE Sensitive     Extended ESBL NEGATIVE Sensitive     * >=100,000 COLONIES/mL ESCHERICHIA COLI  MRSA PCR Screening     Status: Abnormal   Collection Time: 11/18/17  1:17 AM  Result Value Ref Range Status   MRSA by PCR POSITIVE (A) NEGATIVE Final    Comment:  The GeneXpert MRSA Assay (FDA approved for NASAL specimens only), is one component of a comprehensive MRSA colonization surveillance program. It is not intended to diagnose MRSA infection nor to guide or monitor treatment for MRSA infections. RESULT CALLED TO, READ BACK BY AND VERIFIED WITH: E MCDUFFIE RN 11/18/17 0700 JDW   Culture, blood (x 2)     Status: None (Preliminary result)   Collection Time: 11/18/17  3:01 AM  Result Value Ref Range Status   Specimen Description BLOOD RIGHT WRIST  Final   Special Requests   Final    BOTTLES DRAWN AEROBIC ONLY Blood Culture results may not be optimal due to an inadequate volume of blood received in culture bottles   Culture   Final    NO GROWTH 3 DAYS Performed at [facility], [place] [address], [place], [place] [address]    Report Status PENDING  Incomplete  Culture, blood (x 2)     Status: None (Preliminary result)   Collection Time: 11/18/17  3:17 AM  Result Value Ref Range Status   Specimen Description BLOOD RIGHT HAND  Final   Special Requests   Final    BOTTLES DRAWN AEROBIC ONLY Blood Culture results may not be optimal due to an inadequate volume of blood received in culture bottles   Culture   Final    NO GROWTH 3 DAYS Performed at [facility], [place] [name] [name], [place] [address]    Report Status PENDING  Incomplete    Karandeep Resende, MSN, NP-C [place] for Infectious [place] Cell: [phone] Pager: [phone]  11/22/2017  10:32 AM

## 2017-11-23 ENCOUNTER — Inpatient Hospital Stay (HOSPITAL_COMMUNITY): Payer: Medicare Other

## 2017-11-23 LAB — CULTURE, BLOOD (ROUTINE X 2)
Culture: NO GROWTH
Culture: NO GROWTH

## 2017-11-23 LAB — CBC
HCT: 21.6 % — ABNORMAL LOW (ref 39.0–52.0)
HEMOGLOBIN: 6.3 g/dL — AB (ref 13.0–17.0)
MCH: 24 pg — ABNORMAL LOW (ref 26.0–34.0)
MCHC: 29.2 g/dL — ABNORMAL LOW (ref 30.0–36.0)
MCV: 82.1 fL (ref 78.0–100.0)
Platelets: 316 10*3/uL (ref 150–400)
RBC: 2.63 MIL/uL — AB (ref 4.22–5.81)
RDW: 17 % — ABNORMAL HIGH (ref 11.5–15.5)
WBC: 13 10*3/uL — ABNORMAL HIGH (ref 4.0–10.5)

## 2017-11-23 LAB — PREPARE RBC (CROSSMATCH)

## 2017-11-23 MED ORDER — SODIUM CHLORIDE 0.9% IV SOLUTION: Freq: Once | INTRAVENOUS | Status: DC

## 2017-11-23 NOTE — Progress Notes (Addendum)
[facility] Progress Note  2 Days Post-Op  Subjective: CC- wounds Time for dressing change. Patient with no new complaints.  Objective: Vital signs in last 24 hours: Temp:  [99.3 F (37.4 C)-99.8 F (37.7 C)] 99.3 F (37.4 C) (07/11 0733) Pulse Rate:  [96-97] 96 (07/11 0733) Resp:  [18-20] 18 (07/11 0733) BP: (101-105)/(62-78) 101/62 (07/11 0733) SpO2:  [99 %] 99 % (07/11 0733) Weight:  [198 lb 6.6 oz (90 kg)] 198 lb 6.6 oz (90 kg) (07/11 0500) Last BM Date: 11/20/17  Intake/Output from previous day: 07/10 0701 - 07/11 0700 In: [place] [P.O.:1680; I.V.:3; IV Piggyback:100] Out: 4696 [Urine:4050] Intake/Output this shift: Total I/O In: 3 [I.V.:3] Out: -   PE: Gen:  Alert, NAD, pleasant Pulm:  effort normal Abd: Soft, NT, ostomy pink with liquid stool in pouch Psych: A&Ox3  Skin: -Right posterior chest wound: inferior lateral and superior aspects with small amount of necrotic tissue debrided at bedside, minimal yellow slough   Large sacral/perineal decubitus ulcer: no necrosis of slough noted     Lab Results:  Recent Labs    11/21/17 0535 11/22/17 0500  WBC 11.1* 9.9  HGB 7.9* 7.4*  HCT 26.5* 25.2*  PLT 326 303   BMET Recent Labs    11/21/17 0535 11/22/17 0500  NA 137 137  K 3.6 3.3*  CL 102 99  CO2 27 28  GLUCOSE 112* 121*  BUN 8 9  CREATININE 0.49* 0.46*  CALCIUM 8.2* 8.3*   PT/INR No results for input(s): LABPROT, INR in the last 72 hours. CMP     Component Value Date/Time   NA 137 11/22/2017 0500   NA 139 11/10/2016   NA 137 10/07/2014 1148   K 3.3 (L) 11/22/2017 0500   K 3.7 10/07/2014 1148   CL 99 11/22/2017 0500   CO2 28 11/22/2017 0500   CO2 22 10/07/2014 1148   GLUCOSE 121 (H) 11/22/2017 0500   GLUCOSE 117 10/07/2014 1148   BUN 9 11/22/2017 0500   BUN 10 11/10/2016   BUN 8.7 10/07/2014 1148   CREATININE 0.46 (L) 11/22/2017 0500   CREATININE 0.57 (L) 07/01/2015 1606   CREATININE 0.6 (L) 10/07/2014 1148   CALCIUM 8.3 (L) 11/22/2017 0500   CALCIUM 9.1 10/07/2014 1148   PROT 6.9 11/17/2017 1518   PROT 8.0 10/07/2014 1148   ALBUMIN 2.5 (L) 11/17/2017 1518   ALBUMIN 2.8 (L) 10/07/2014 1148   AST 13 (L) 11/17/2017 1518   AST 16 10/07/2014 1148   ALT 11 11/17/2017 1518   ALT 7 10/07/2014 1148   ALKPHOS 62 11/17/2017 1518   ALKPHOS 80 10/07/2014 1148   BILITOT 0.6 11/17/2017 1518   BILITOT <0.20 10/07/2014 1148   GFRNONAA >60 11/22/2017 0500   GFRAA >60 11/22/2017 0500   Lipase     Component Value Date/Time   LIPASE 40 11/17/2017 1518       Studies/Results: No results found.  Anti-infectives: Anti-infectives (From admission, onward)   Start     Dose/Rate Route Frequency Ordered Stop   11/19/17 1400  ceFEPIme (MAXIPIME) 1 g in sodium chloride 0.9 % 100 mL IVPB     1 g 200 mL/hr over 30 Minutes Intravenous Every 8 hours 11/19/17 1032 11/23/17 2359   11/19/17 1030  sulfamethoxazole-trimethoprim (BACTRIM DS,SEPTRA DS) 800-160 MG per tablet 1 tablet  Status:  Discontinued     1 tablet Oral Every 12 hours 11/19/17 1018 11/19/17 1032   11/18/17 1100  DAPTOmycin (CUBICIN) 500 mg in sodium chloride  0.9 % IVPB     500 mg 220 mL/hr over 30 Minutes Intravenous Every 24 hours 11/18/17 1023 11/23/17 1245   11/18/17 0930  metroNIDAZOLE (FLAGYL) tablet 500 mg  Status:  Discontinued     500 mg Oral Every 8 hours 11/18/17 0929 11/18/17 1614   11/18/17 0600  ceFEPIme (MAXIPIME) 1 g in sodium chloride 0.9 % 100 mL IVPB  Status:  Discontinued     1 g 200 mL/hr over 30 Minutes Intravenous Every 8 hours 11/17/17 2334 11/19/17 1018   11/17/17 2345  ceFEPIme (MAXIPIME) 2 g in sodium chloride 0.9 % 100 mL IVPB     2 g 200 mL/hr over 30 Minutes Intravenous  Once 11/17/17 2333 11/18/17 0700   11/17/17 1615  cefTRIAXone (ROCEPHIN) 1 g in sodium chloride 0.9 % 100 mL IVPB     1 g 200 mL/hr over 30 Minutes Intravenous  Once 11/17/17 1601 11/17/17 1716   11/17/17 0000  cephALEXin (KEFLEX) 500 MG capsule      500 mg Oral 4 times daily 11/17/17 1630 11/27/17 2359       Assessment/Plan Right posterior chest pressure ulcer (unstageable)  -  BID santyl and wet-to-dry dressing changes. Sacral pressure ulcer (stage 4) - S/P diverting colostomy 2007. BID wet to dry dressing changes  Plan: Patient does not need any surgical debridement, continue wound care as above.  General surgery will sign off, call with concerns.   LOS: 6 days    Wellington Hampshire , [facility] Surgery 11/23/2017, 3:53 PM Pager: [phone] Consults: [phone]  Agree with above. No acute surgical issues.  Alphonsa Overall, MD, [facility] Surgery Pager: [phone] Office phone:  [phone]

## 2017-11-23 NOTE — Progress Notes (Signed)
RT at pt's room to place him on CPAP for the night.  RN is starting a unit of blood and wants pt to wait to go on CPAP until the unit of blood is completed. RN to call RT when pt is ready for CPAP.

## 2017-11-23 NOTE — Progress Notes (Signed)
CRITICAL VALUE ALERT  Critical Value:  Hemoglobin 6.3  Date & Time Notied: 11/23/17  Provider Notified: Sarajane Jews  Orders Received/Actions taken: 1 unit of blood ordered

## 2017-11-23 NOTE — Progress Notes (Signed)
[facility] Gastroenterology Progress Note  [name] [name] 52 y.o. December 21, 1965  CC:  Anemia/occult blood positive stool   Subjective: patient denies any GI symptoms. Brown color stool in the ostomy. Denies any blood in the stool.  ROS : negative for chest pain and SOB    Objective: Vital signs in last 24 hours: Vitals:   11/22/17 2257 11/23/17 0733  BP: 105/78 101/62  Pulse: 97 96  Resp: 20 18  Temp: 99.8 F (37.7 C) 99.3 F (37.4 C)  SpO2: 99% 99%    Physical Exam: GEN : A/O X3, NAD resting comfortably. Extremities contracted.  . Abdominal exam showed soft abdomen, nondistended, no tenderness, bowel sounds present. No peritoneal signs.left lower ostomy with brown stool    Lab Results: Recent Labs    11/21/17 0535 11/22/17 0500  NA 137 137  K 3.6 3.3*  CL 102 99  CO2 27 28  GLUCOSE 112* 121*  BUN 8 9  CREATININE 0.49* 0.46*  CALCIUM 8.2* 8.3*  MG 1.6* 1.7   No results for input(s): AST, ALT, ALKPHOS, BILITOT, PROT, ALBUMIN in the last 72 hours. Recent Labs    11/21/17 0535 11/22/17 0500  WBC 11.1* 9.9  HGB 7.9* 7.4*  HCT 26.5* 25.2*  MCV 81.8 81.6  PLT 326 303   No results for input(s): LABPROT, INR in the last 72 hours.    Assessment/Plan: - Anemia with Hemoccult positive stool in patient with history of esophagitis and gastritis. No recent colonoscopy. Last colonoscopy more than 10 years ago according to patient. Not able to find reports. - Sepsis from infected decubitus ulcer/UTI - Quadriplegia - Status post colostomy because of decubitus ulcer in 2007.  Recommendations -------------------------- -  EGD again showed esophagitis and gastritis. Biopsies negative for H. Pylori as well as Candida - He does not want to get barium enema examination and prefers inpatient colonoscopy now. Burnis Medin plan for tentative inpatient colonoscopy for Saturday. - Full liquid diet today, clears tomorrow - GI will follow.    Otis Brace MD, FACP 11/23/2017, 3:24  PM  Contact #  [phone]
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
## 2017-11-24 DIAGNOSIS — T83511A Infection and inflammatory reaction due to indwelling urethral catheter, initial encounter: Secondary | ICD-10-CM

## 2017-11-24 LAB — PREPARE RBC (CROSSMATCH)

## 2017-11-24 LAB — CBC
HEMATOCRIT: 23.6 % — AB (ref 39.0–52.0)
Hemoglobin: 7.1 g/dL — ABNORMAL LOW (ref 13.0–17.0)
MCH: 24.5 pg — AB (ref 26.0–34.0)
MCHC: 30.1 g/dL (ref 30.0–36.0)
MCV: 81.4 fL (ref 78.0–100.0)
Platelets: 305 10*3/uL (ref 150–400)
RBC: 2.9 MIL/uL — ABNORMAL LOW (ref 4.22–5.81)
RDW: 16.9 % — ABNORMAL HIGH (ref 11.5–15.5)
WBC: 12.7 10*3/uL — ABNORMAL HIGH (ref 4.0–10.5)

## 2017-11-24 MED ORDER — METOCLOPRAMIDE HCL 10 MG PO TABS
10.0000 mg | ORAL_TABLET | Freq: Three times a day (TID) | ORAL | Status: DC
  Administered 2017-11-25 – 2017-11-27 (×10): 10 mg via ORAL
  Filled 2017-11-24: qty 1
  Filled 2017-11-24: qty 2
  Filled 2017-11-24 (×8): qty 1

## 2017-11-24 MED ORDER — SODIUM CHLORIDE 0.9% IV SOLUTION
Freq: Once | INTRAVENOUS | Status: AC
  Administered 2017-11-24: 12:00:00 via INTRAVENOUS
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
## 2017-11-24 NOTE — Progress Notes (Signed)
[name] Gastroenterology Progress Note  [name] [name] 52 y.o. Mar 17, 1966  CC:  Anemia/occult blood positive stool   Subjective:  Hemoglobin down to 6.3 yesterday. Improved with blood transfusion. No evidence of active GI bleed. Brown color stool in ostomy.  ROS : negative for chest pain and SOB    Objective: Vital signs in last 24 hours: Vitals:   11/24/17 0133 11/24/17 0753  BP: 112/79 104/78  Pulse: 94 89  Resp: (!) 21 20  Temp: 99.1 F (37.3 C) 98.8 F (37.1 C)  SpO2: 100%     Physical Exam: GEN : A/O X3, NAD resting comfortably. Extremities contracted.  . Abdominal exam showed soft abdomen, nondistended, no tenderness, bowel sounds present. No peritoneal signs.left lower ostomy with brown stool    Lab Results: Recent Labs    11/22/17 0500  NA 137  K 3.3*  CL 99  CO2 28  GLUCOSE 121*  BUN 9  CREATININE 0.46*  CALCIUM 8.3*  MG 1.7   No results for input(s): AST, ALT, ALKPHOS, BILITOT, PROT, ALBUMIN in the last 72 hours. Recent Labs    11/23/17 1648 11/24/17 0442  WBC 13.0* 12.7*  HGB 6.3* 7.1*  HCT 21.6* 23.6*  MCV 82.1 81.4  PLT 316 305   No results for input(s): LABPROT, INR in the last 72 hours.    Assessment/Plan: - Anemia with Hemoccult positive stool in patient with history of esophagitis and gastritis. No recent colonoscopy. Last colonoscopy more than 10 years ago according to patient. Not able to find reports. - Sepsis from infected decubitus ulcer/UTI - Quadruplegia - Status post colostomy because of decubitus ulcer in 2007.  Recommendations -------------------------- -  EGD again showed esophagitis and gastritis. Biopsies negative for H. Pylori as well as Candida - D/W Dr. Watt Climes who will be covering [facility] GI service starting tomorrow. Hold off on colonoscopy for weekend. D/W  patient. He verbalized understanding. Patient wants to have a regular diet. Tentative plan for colonoscopy to be decided by Dr. Watt Climes tomorrow. - monitor H&H.  Transfuse to keep hemoglobin around 7-8. - GI will follow   Otis Brace MD, [place] 11/24/2017, 12:27 PM  Contact #  [phone]

## 2017-11-24 NOTE — Plan of Care (Signed)
  Problem: Health Behavior/Discharge Planning: Goal: Ability to manage health-related needs will improve Outcome: Progressing  Pt updated on POC. Plan is for pt to have a colonoscopy on Monday 7/15. Pt verbalized understanding with teachback.
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
## 2017-11-25 LAB — BPAM RBC
BLOOD PRODUCT EXPIRATION DATE: 201908052359
Blood Product Expiration Date: 201908052359
ISSUE DATE / TIME: 201907112221
ISSUE DATE / TIME: 201907121423
UNIT TYPE AND RH: 7300
Unit Type and Rh: 7300

## 2017-11-25 LAB — TYPE AND SCREEN
ABO/RH(D): B POS
Antibody Screen: NEGATIVE
Unit division: 0
Unit division: 0

## 2017-11-25 LAB — CBC
HCT: 30.6 % — ABNORMAL LOW (ref 39.0–52.0)
Hemoglobin: 9.3 g/dL — ABNORMAL LOW (ref 13.0–17.0)
MCH: 23.9 pg — AB (ref 26.0–34.0)
MCHC: 30.4 g/dL (ref 30.0–36.0)
MCV: 78.7 fL (ref 78.0–100.0)
PLATELETS: 360 10*3/uL (ref 150–400)
RBC: 3.89 MIL/uL — AB (ref 4.22–5.81)
RDW: 16.8 % — ABNORMAL HIGH (ref 11.5–15.5)
WBC: 14 10*3/uL — AB (ref 4.0–10.5)

## 2017-11-25 MED ORDER — FUROSEMIDE 20 MG PO TABS
20.0000 mg | ORAL_TABLET | Freq: Two times a day (BID) | ORAL | Status: DC
  Administered 2017-11-25 – 2017-11-27 (×5): 20 mg via ORAL
  Filled 2017-11-25 (×5): qty 1

## 2017-11-25 MED ORDER — SODIUM CHLORIDE 0.9 % IV SOLN
INTRAVENOUS | Status: DC
  Administered 2017-11-26: 10 mL via INTRAVENOUS

## 2017-11-25 MED ORDER — POLYETHYLENE GLYCOL 3350 17 G PO PACK
17.0000 g | PACK | ORAL | Status: DC
  Administered 2017-11-25 – 2017-11-26 (×5): 17 g via ORAL
  Filled 2017-11-25 (×5): qty 1

## 2017-11-25 MED ORDER — SODIUM CHLORIDE 0.9 % IV SOLN: INTRAVENOUS | Status: DC

## 2017-11-25 MED ORDER — SODIUM CHLORIDE 0.9 % IV BOLUS
500.0000 mL | Freq: Once | INTRAVENOUS | Status: AC
  Administered 2017-11-25: 500 mL via INTRAVENOUS

## 2017-11-25 MED ORDER — MAGNESIUM CITRATE PO SOLN
1.0000 | Freq: Once | ORAL | Status: AC
  Administered 2017-11-26: 1 via ORAL
  Filled 2017-11-25: qty 296

## 2017-11-25 MED ORDER — POLYETHYLENE GLYCOL 3350 17 GM/SCOOP PO POWD
255.0000 g | Freq: Once | ORAL | Status: AC
  Administered 2017-11-26: 255 g via ORAL
  Filled 2017-11-25: qty 255

## 2017-11-25 MED ORDER — POTASSIUM CHLORIDE CRYS ER 20 MEQ PO TBCR
20.0000 meq | EXTENDED_RELEASE_TABLET | Freq: Two times a day (BID) | ORAL | Status: DC
  Administered 2017-11-25 – 2017-11-27 (×5): 20 meq via ORAL
  Filled 2017-11-25 (×5): qty 1

## 2017-11-25 NOTE — Progress Notes (Signed)
Antoine Poche Mantia 10:05 AM  Subjective: Patient seen and examined and case discussed with Dr. B and hospital computer chart reviewed and patient well-known to me from multiple previous hospital stays and currently his stool is brown but he continues to have decreasing hemoglobin and he is not having any GI symptoms currently and he has not had a colonoscopy and years and he has no other new complaints he does say nothing comes out into his rectum and only has some blood from his decubiti  Objective: Vital signs stable afebrile no acute distress lying comfortably in the bed lungs are okay abdomen is soft nontender hemoglobin okay status post transfusion  Assessment: Multiple medical problems including recurrent guaiac positive anemia  Plan: After discussion with the patient and his sister including risks methods of colonoscopy we will proceed on Monday and if no other issues or significant findings probably can go home afterward and case discussed with the nurse as well  [facility]  Pager [phone] After 5PM or if no answer call [phone]

## 2017-11-25 NOTE — Plan of Care (Signed)
Pt without anxiety or pain.  Appears comfortable.

## 2017-11-25 NOTE — Care Management Note (Addendum)
Case Management Note  Patient Details  Name: [name] [name] MRN: [id number] Date of Birth: [birth year]  Subjective/Objective:    From home with sister  and family, presents with acute lower uti, has hx of quad, chronic foley, colostomy, pressure ulcers, he is active with [facility] for [facility]  7/13 Tomi Bamberger RN, BSN - NCM notified Malachy Mood with [facility] that patient is for possible discharge today, she states she will have [facility] to see patient tomorrow.   7/15 Tomi Bamberger RN, BSN - patient for [employer] today, Malachy Mood with [employer] notified,  Ambulance transport forms are on the chart, address verified.  [facility]AR to transport , set up for 6 pm.                  Action/Plan: DC home with resumption of [place] services when ready.  Expected Discharge Date:                  Expected Discharge Plan:  [place]  In-House Referral:     Discharge planning Services  CM Consult  Post Acute Care Choice:  Resumption of Svcs/PTA Provider Choice offered to:     DME Arranged:    DME Agency:     HH Arranged:  RN [place] Agency:  [place]  Status of Service:  Completed, signed off  If discussed at [place] of Stay Meetings, dates discussed:    Additional Comments:  [name], RN 11/25/2017, 10:10 AM

## 2017-11-25 NOTE — Progress Notes (Signed)
Dressing changes to right chest and buttock completed using aseptic technique per orders.  No change in wound bed noted.  Pt tolerated well.
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
## 2017-11-25 NOTE — Care Management Important Message (Signed)
Important Message  Patient Details  Name: COLSON BARCO MRN: [id number] Date of Birth: Apr 04, 1966   Medicare Important Message Given:  Yes    [name], RN 11/25/2017, 10:19 AM

## 2017-11-26 LAB — CBC
HEMATOCRIT: 31.8 % — AB (ref 39.0–52.0)
HEMOGLOBIN: 9.6 g/dL — AB (ref 13.0–17.0)
MCH: 24.1 pg — ABNORMAL LOW (ref 26.0–34.0)
MCHC: 30.2 g/dL (ref 30.0–36.0)
MCV: 79.9 fL (ref 78.0–100.0)
Platelets: 427 10*3/uL — ABNORMAL HIGH (ref 150–400)
RBC: 3.98 MIL/uL — AB (ref 4.22–5.81)
RDW: 16.9 % — ABNORMAL HIGH (ref 11.5–15.5)
WBC: 13.6 10*3/uL — AB (ref 4.0–10.5)
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
## 2017-11-26 NOTE — Progress Notes (Signed)
[name] [name] 9:35 AM  Subjective: Patient doing fine without any new complaints but he has not started the prep despite magnesium citrate being ordered at 7 AM which is very frustrating since I expect he will be very hard to prep   Objective: Vital signs stable afebrile exam unchanged Hemoglobin stable Assessment: Anemia guaiac positivity for colonoscopy tomorrow  Plan: Hopefully the nursing staff oral Will begin prep and assist him today with getting through it so that we can proceed tomorrow and hopefully the hospital team will encourage them as well  [facility] E  Pager [phone] After 5PM or if no answer call [phone]

## 2017-11-26 NOTE — Progress Notes (Signed)
Despite late start on bowel prep, BM now full liquid consistency. No solid BM's noted with past 3 BM's. Opaque in color but liquid. P tolerating well. No complaints of N/V at this time.

## 2017-11-27 ENCOUNTER — Inpatient Hospital Stay (HOSPITAL_COMMUNITY): Payer: Medicare Other | Admitting: Certified Registered"

## 2017-11-27 ENCOUNTER — Encounter (HOSPITAL_COMMUNITY)
Admission: EM | Disposition: A | Discharge status: Home Health | Payer: Self-pay | Source: Home / Self Care | Attending: Family Medicine

## 2017-11-27 ENCOUNTER — Encounter (HOSPITAL_COMMUNITY): Payer: Self-pay | Admitting: *Deleted

## 2017-11-27 HISTORY — PX: COLONOSCOPY WITH PROPOFOL: SHX5780

## 2017-11-27 LAB — BASIC METABOLIC PANEL
Anion gap: 6 (ref 5–15)
BUN: 6 mg/dL (ref 6–20)
CO2: 29 mmol/L (ref 22–32)
Calcium: 8.6 mg/dL — ABNORMAL LOW (ref 8.9–10.3)
Chloride: 104 mmol/L (ref 98–111)
Creatinine, Ser: 0.4 mg/dL — ABNORMAL LOW (ref 0.61–1.24)
GFR calc Af Amer: >60 mL/min (ref >60)
GLUCOSE: 104 mg/dL — AB (ref 70–99)
Potassium: 3.9 mmol/L (ref 3.5–5.1)
SODIUM: 139 mmol/L (ref 135–145)

## 2017-11-27 LAB — CBC
HCT: 32.2 % — ABNORMAL LOW (ref 39.0–52.0)
Hemoglobin: 9.6 g/dL — ABNORMAL LOW (ref 13.0–17.0)
MCH: 24.1 pg — AB (ref 26.0–34.0)
MCHC: 29.8 g/dL — ABNORMAL LOW (ref 30.0–36.0)
MCV: 80.7 fL (ref 78.0–100.0)
PLATELETS: 458 10*3/uL — AB (ref 150–400)
RBC: 3.99 MIL/uL — AB (ref 4.22–5.81)
RDW: 16.8 % — AB (ref 11.5–15.5)
WBC: 14.2 10*3/uL — ABNORMAL HIGH (ref 4.0–10.5)

## 2017-11-27 SURGERY — COLONOSCOPY WITH PROPOFOL
Anesthesia: Monitor Anesthesia Care

## 2017-11-27 MED ORDER — SODIUM CHLORIDE 0.9 % IV SOLN
INTRAVENOUS | Status: DC | PRN
  Administered 2017-11-27: 30 ug/min via INTRAVENOUS

## 2017-11-27 MED ORDER — HEPARIN SOD (PORK) LOCK FLUSH 100 UNIT/ML IV SOLN
500.0000 [IU] | INTRAVENOUS | Status: AC | PRN
  Administered 2017-11-27: 500 [IU]

## 2017-11-27 MED ORDER — PROPOFOL 10 MG/ML IV BOLUS
INTRAVENOUS | Status: DC | PRN
  Administered 2017-11-27: 50 mg via INTRAVENOUS
  Administered 2017-11-27: 20 mg via INTRAVENOUS

## 2017-11-27 MED ORDER — PROPOFOL 500 MG/50ML IV EMUL
INTRAVENOUS | Status: DC | PRN
  Administered 2017-11-27: 80 ug/kg/min via INTRAVENOUS

## 2017-11-27 MED ORDER — PHENYLEPHRINE HCL 10 MG/ML IJ SOLN
INTRAMUSCULAR | Status: DC | PRN
  Administered 2017-11-27: 100 ug via INTRAVENOUS

## 2017-11-27 MED ORDER — LACTATED RINGERS IV SOLN
INTRAVENOUS | Status: DC | PRN
  Administered 2017-11-27: 11:00:00 via INTRAVENOUS

## 2017-11-27 MED ORDER — LACTATED RINGERS IV SOLN
INTRAVENOUS | Status: AC | PRN
  Administered 2017-11-27: 1000 mL via INTRAVENOUS

## 2017-11-27 SURGICAL SUPPLY — 22 items
ELECT REM PT RETURN 9FT ADLT (ELECTROSURGICAL)
ELECTRODE REM PT RTRN 9FT ADLT (ELECTROSURGICAL) IMPLANT
FCP BXJMBJMB 240X2.8X (CUTTING FORCEPS)
FLOOR PAD 36X40 (MISCELLANEOUS) ×2
FORCEPS BIOP RAD 4 LRG CAP 4 (CUTTING FORCEPS) IMPLANT
FORCEPS BIOP RJ4 240 W/NDL (CUTTING FORCEPS)
FORCEPS BXJMBJMB 240X2.8X (CUTTING FORCEPS) IMPLANT
INJECTOR/SNARE I SNARE (MISCELLANEOUS) IMPLANT
LUBRICANT JELLY 4.5OZ STERILE (MISCELLANEOUS) IMPLANT
MANIFOLD NEPTUNE II (INSTRUMENTS) IMPLANT
NDL SCLEROTHERAPY 25GX240 (NEEDLE) IMPLANT
NEEDLE SCLEROTHERAPY 25GX240 (NEEDLE) IMPLANT
PAD FLOOR 36X40 (MISCELLANEOUS) ×1 IMPLANT
PROBE APC STR FIRE (PROBE) IMPLANT
PROBE INJECTION GOLD (MISCELLANEOUS)
PROBE INJECTION GOLD 7FR (MISCELLANEOUS) IMPLANT
SNARE ROTATE MED OVAL 20MM (MISCELLANEOUS) IMPLANT
SYR 50ML LL SCALE MARK (SYRINGE) IMPLANT
TRAP SPECIMEN MUCOUS 40CC (MISCELLANEOUS) IMPLANT
TUBING ENDO SMARTCAP PENTAX (MISCELLANEOUS) IMPLANT
TUBING IRRIGATION ENDOGATOR (MISCELLANEOUS) ×2 IMPLANT
WATER STERILE IRR 1000ML POUR (IV SOLUTION) IMPLANT

## 2017-11-27 NOTE — Progress Notes (Signed)
[name] [name] 11:07 AM  Subjective: Patient without any new complaints tolerated his prep says his bowels are clear no signs of bleeding  Objective: Vital signs stable afebrile no acute distress exam please see preassessment evaluationlabs stable  Assessment: Guaiac positive anemia in patient due for colonic screening  Plan: Okay to proceed with colonoscopy via ostomy with anesthesia assistance  [facility] E  Pager [phone] After 5PM or if no answer call [phone]
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
## 2017-11-27 NOTE — Anesthesia Preprocedure Evaluation (Signed)
Anesthesia Evaluation  Patient identified by MRN, date of birth, ID band Patient awake    Reviewed: Allergy & Precautions, NPO status , Patient's Chart, lab work & pertinent test results  Airway Mallampati: III       Dental no notable dental hx. (+) Teeth Intact, Dental Advisory Given   Pulmonary neg pulmonary ROS, sleep apnea and Continuous Positive Airway Pressure Ventilation ,    Pulmonary exam normal        Cardiovascular hypertension, Pt. on medications Normal cardiovascular exam     Neuro/Psych Seizures -,  Depression  Neuromuscular disease negative psych ROS   GI/Hepatic GERD  ,  Endo/Other    Renal/GU      Musculoskeletal   Abdominal   Peds  Hematology  (+) anemia ,   Anesthesia Other Findings   Reproductive/Obstetrics                            Anesthesia Physical Anesthesia Plan  ASA: II  Anesthesia Plan: MAC   Post-op Pain Management:    Induction: Intravenous  PONV Risk Score and Plan: Treatment may vary due to age or medical condition  Airway Management Planned: Mask, Natural Airway and Nasal Cannula  Additional Equipment:   Intra-op Plan:   Post-operative Plan:   Informed Consent: I have reviewed the patients History and Physical, chart, labs and discussed the procedure including the risks, benefits and alternatives for the proposed anesthesia with the patient or authorized representative who has indicated his/her understanding and acceptance.     Plan Discussed with: CRNA  Anesthesia Plan Comments:        Anesthesia Quick Evaluation

## 2017-11-27 NOTE — Progress Notes (Signed)
Pt had colonoscopy done today, seen by MD, orders written for d/c.  Went over discharge instructions with pt and answered all questions.  Pt port deaccessed by IV team.  Pt discharged via ambulance on stretcher with all belongings.  Sister will be home for pt when he gets there.  Will follow up outpatient with MD.

## 2017-11-27 NOTE — Op Note (Signed)
[facility] Patient Name: [name] [name] Procedure Date : 11/27/2017 MRN: [id number] Attending MD: Clarene Essex , MD Date of Birth: January 10, 1966 CSN: [id number] Age: 52 Admit Type: Inpatient Procedure:                Colonoscopy Indications:              Screening for colorectal malignant neoplasm,                            Incidental - Heme positive stool, Incidental - Iron                            deficiency anemia secondary to chronic blood loss Providers:                Clarene Essex, MD, [name], RN, Elspeth Cho                            Tech., Technician Referring MD:              Medicines:                Propofol total dose 992 mg IV Complications:            No immediate complications. Estimated Blood Loss:     Estimated blood loss: none. Procedure:                Pre-Anesthesia Assessment:                           - Prior to the procedure, a History and Physical                            was performed, and patient medications and                            allergies were reviewed. The patient's tolerance of                            previous anesthesia was also reviewed. The risks                            and benefits of the procedure and the sedation                            options and risks were discussed with the patient.                            All questions were answered, and informed consent                            was obtained. Prior Anticoagulants: The patient has                            taken no previous anticoagulant or antiplatelet  agents. ASA Grade Assessment: III - A patient with                            severe systemic disease. After reviewing the risks                            and benefits, the patient was deemed in                            satisfactory condition to undergo the procedure.                           After obtaining informed consent, the colonoscope                            was  passed under direct vision. Throughout the                            procedure, the patient's blood pressure, pulse, and                            oxygen saturations were monitored continuously. The                            EC-[id number] ([id number]) scope was introduced through                            the sigmoid colostomy and advanced to the the                            cecum, identified by appendiceal orifice and                            ileocecal valve. The ileocecal valve and the                            appendiceal orifice were photographed. The                            colonoscopy was performed without difficulty. The                            patient tolerated the procedure well. The quality                            of the bowel preparation was adequate. We did use                            the two-man approach to this procedure with the                            nurse advancing the scope was slowly under my  guidance Scope In: 11:25:54 AM Scope Out: 11:40:52 AM Scope Withdrawal Time: 0 hours 10 minutes 43 seconds  Total Procedure Duration: 0 hours 14 minutes 58 seconds  Findings:      The proximal sigmoid colon, descending colon, transverse colon,       ascending colon and cecum appeared normal.      The exam was otherwise without abnormality. Impression:               - The proximal sigmoid colon, descending colon,                            transverse colon, ascending colon and cecum are                            normal.                           - The examination was otherwise normal.                           - No specimens collected. Recommendation:           - Patient has a contact number available for                            emergencies. The signs and symptoms of potential                            delayed complications were discussed with the                            patient. Return to normal activities tomorrow.                             Written discharge instructions were provided to the                            patient.                           - Soft diet today. okay with me to go home                           - Continue present medications.                           - Repeat colonoscopy in 10 years for screening                            purposes.                           - Return to GI office PRN.                           - Telephone GI clinic if symptomatic PRN. Procedure Code(s):        --- Professional ---  44388, Colonoscopy through stoma; diagnostic,                            including collection of specimen(s) by brushing or                            washing, when performed (separate procedure) Diagnosis Code(s):        --- Professional ---                           Z12.11, Encounter for screening for malignant                            neoplasm of colon CPT copyright 2017 [employer]. All rights reserved. The codes documented in this report are preliminary and upon coder review may  be revised to meet current compliance requirements. Clarene Essex, MD 11/27/2017 11:56:28 AM This report has been signed electronically. Number of Addenda: 0

## 2017-11-27 NOTE — Transfer of Care (Signed)
Immediate Anesthesia Transfer of Care Note  Patient: [name] [name]  Procedure(s) Performed: COLONOSCOPY WITH PROPOFOL (N/A )  Patient Location: PACU  Anesthesia Type:MAC  Level of Consciousness: awake, alert , oriented and patient cooperative  Airway & Oxygen Therapy: Patient Spontanous Breathing and Patient connected to face mask oxygen  Post-op Assessment: Report given to RN and Post -op Vital signs reviewed and stable  Post vital signs: Reviewed and stable  Last Vitals:  Vitals Value Taken Time  BP 93/52 11/27/2017 11:53 AM  Temp    Pulse    Resp 18 11/27/2017 11:53 AM  SpO2 100 % 11/27/2017 11:53 AM    Last Pain:  Vitals:   11/27/17 1153  TempSrc:   PainSc: 0-No pain         Complications: No apparent anesthesia complications

## 2017-11-27 NOTE — Discharge Summary (Signed)
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  Culture, blood (x 2)     Status: None   Collection Time: 11/18/17  3:17 AM  Result Value Ref Range Status   Specimen Description BLOOD RIGHT HAND  Final   Special Requests   Final    BOTTLES DRAWN AEROBIC ONLY Blood Culture results may not be optimal due to an inadequate volume of blood received in culture bottles   Culture   Final    NO GROWTH 5 DAYS Performed at [facility], [place] [address], [name], [place] [address]    Report Status 11/23/2017 FINAL  Final     Labs: Basic Metabolic Panel: Recent Labs  Lab 11/21/17 0535 11/22/17 0500 11/27/17 0855  NA 137 137 139  K 3.6 3.3* 3.9  CL 102 99 104  CO2 27 28 29   GLUCOSE 112* 121* 104*  BUN 8 9 6   CREATININE 0.49* 0.46* 0.40*  CALCIUM 8.2* 8.3* 8.6*  MG 1.6* 1.7  --    CBC: Recent Labs  Lab 11/23/17 1648 11/24/17 0442  11/25/17 0100 11/26/17 0500 11/27/17 0855  WBC 13.0* 12.7* 14.0* 13.6* 14.2*  HGB 6.3* 7.1* 9.3* 9.6* 9.6*  HCT 21.6* 23.6* 30.6* 31.8* 32.2*  MCV 82.1 81.4 78.7 79.9 80.7  PLT 316 305 360 427* 458*    Principal Problem:   Sepsis ([name] Active Problems:   Quadriplegia ([name]   Sacral decubitus ulcer, stage IV ([name]   OSA on CPAP   Iron deficiency anemia   Non-intractable vomiting with nausea   Osteomyelitis of thoracic region [facility])   Gastroparesis   Acute lower UTI   Time coordinating discharge: 35 minutes  Signed:  Murray Hodgkins, MD [facility] 11/27/2017, 2:16 PM

## 2017-11-27 NOTE — Progress Notes (Signed)
Placed patient on CPAP via FFM, auto titrate settings (max 18.0, min 6.0) cm H20.  Tolerating well at this time. RN aware.

## 2017-11-28 NOTE — Anesthesia Postprocedure Evaluation (Signed)
Anesthesia Post Note  Patient: [name] [name]  Procedure(s) Performed: COLONOSCOPY WITH PROPOFOL (N/A )     Patient location during evaluation: Endoscopy Anesthesia Type: MAC Level of consciousness: awake and alert Pain management: pain level controlled Vital Signs Assessment: post-procedure vital signs reviewed and stable Respiratory status: spontaneous breathing, nonlabored ventilation, respiratory function stable and patient connected to nasal cannula oxygen Cardiovascular status: stable and blood pressure returned to baseline Postop Assessment: no apparent nausea or vomiting Anesthetic complications: no    Last Vitals:  Vitals:   11/27/17 1153 11/27/17 1551  BP: (!) 93/52 125/80  Pulse:  92  Resp: 18 16  Temp:  36.9 C  SpO2: 100% 100%    Last Pain:  Vitals:   11/27/17 1551  TempSrc: Oral  PainSc: 0-No pain                 Barnet Glasgow

## 2017-12-01 ENCOUNTER — Encounter (HOSPITAL_BASED_OUTPATIENT_CLINIC_OR_DEPARTMENT_OTHER): Payer: Medicare Other | Attending: Internal Medicine

## 2017-12-01 DIAGNOSIS — M8668 Other chronic osteomyelitis, other site: Secondary | ICD-10-CM | POA: Diagnosis not present

## 2017-12-01 DIAGNOSIS — L89323 Pressure ulcer of left buttock, stage 3: Secondary | ICD-10-CM | POA: Diagnosis not present

## 2017-12-01 DIAGNOSIS — R110 Nausea: Secondary | ICD-10-CM | POA: Diagnosis not present

## 2017-12-01 DIAGNOSIS — L89329 Pressure ulcer of left buttock, unspecified stage: Secondary | ICD-10-CM | POA: Diagnosis not present

## 2017-12-01 DIAGNOSIS — L89159 Pressure ulcer of sacral region, unspecified stage: Secondary | ICD-10-CM | POA: Diagnosis not present

## 2017-12-01 DIAGNOSIS — L89154 Pressure ulcer of sacral region, stage 4: Secondary | ICD-10-CM | POA: Diagnosis not present

## 2017-12-01 DIAGNOSIS — L89899 Pressure ulcer of other site, unspecified stage: Secondary | ICD-10-CM | POA: Insufficient documentation

## 2017-12-01 DIAGNOSIS — L89894 Pressure ulcer of other site, stage 4: Secondary | ICD-10-CM | POA: Diagnosis not present

## 2017-12-01 DIAGNOSIS — L89104 Pressure ulcer of unspecified part of back, stage 4: Secondary | ICD-10-CM | POA: Diagnosis not present

## 2017-12-01 DIAGNOSIS — G8250 Quadriplegia, unspecified: Secondary | ICD-10-CM | POA: Diagnosis not present

## 2017-12-01 DIAGNOSIS — L89219 Pressure ulcer of right hip, unspecified stage: Secondary | ICD-10-CM | POA: Diagnosis not present

## 2017-12-01 DIAGNOSIS — L89619 Pressure ulcer of right heel, unspecified stage: Secondary | ICD-10-CM | POA: Insufficient documentation

## 2017-12-01 DIAGNOSIS — L89119 Pressure ulcer of right upper back, unspecified stage: Secondary | ICD-10-CM | POA: Diagnosis not present

## 2017-12-07 DIAGNOSIS — I959 Hypotension, unspecified: Secondary | ICD-10-CM | POA: Diagnosis not present

## 2017-12-07 DIAGNOSIS — R000 Tachycardia, unspecified: Secondary | ICD-10-CM | POA: Diagnosis not present

## 2017-12-07 DIAGNOSIS — R0902 Hypoxemia: Secondary | ICD-10-CM | POA: Diagnosis not present

## 2017-12-07 DIAGNOSIS — J189 Pneumonia, unspecified organism: Secondary | ICD-10-CM | POA: Diagnosis not present

## 2017-12-08 DIAGNOSIS — S12490S Other displaced fracture of fifth cervical vertebra, sequela: Secondary | ICD-10-CM | POA: Diagnosis not present

## 2017-12-08 DIAGNOSIS — R109 Unspecified abdominal pain: Secondary | ICD-10-CM | POA: Diagnosis not present

## 2017-12-08 DIAGNOSIS — K812 Acute cholecystitis with chronic cholecystitis: Secondary | ICD-10-CM | POA: Diagnosis not present

## 2017-12-08 DIAGNOSIS — L89890 Pressure ulcer of other site, unstageable: Secondary | ICD-10-CM | POA: Diagnosis not present

## 2017-12-08 DIAGNOSIS — Z87440 Personal history of urinary (tract) infections: Secondary | ICD-10-CM | POA: Diagnosis not present

## 2017-12-08 DIAGNOSIS — K8012 Calculus of gallbladder with acute and chronic cholecystitis without obstruction: Secondary | ICD-10-CM | POA: Diagnosis not present

## 2017-12-08 DIAGNOSIS — M8669 Other chronic osteomyelitis, multiple sites: Secondary | ICD-10-CM | POA: Diagnosis not present

## 2017-12-08 DIAGNOSIS — L1089 Other pemphigus: Secondary | ICD-10-CM | POA: Diagnosis not present

## 2017-12-08 DIAGNOSIS — L89214 Pressure ulcer of right hip, stage 4: Secondary | ICD-10-CM | POA: Diagnosis not present

## 2017-12-08 DIAGNOSIS — M2550 Pain in unspecified joint: Secondary | ICD-10-CM | POA: Diagnosis not present

## 2017-12-08 DIAGNOSIS — M8668 Other chronic osteomyelitis, other site: Secondary | ICD-10-CM | POA: Diagnosis not present

## 2017-12-08 DIAGNOSIS — L89893 Pressure ulcer of other site, stage 3: Secondary | ICD-10-CM | POA: Diagnosis not present

## 2017-12-08 DIAGNOSIS — A4159 Other Gram-negative sepsis: Secondary | ICD-10-CM | POA: Diagnosis not present

## 2017-12-08 DIAGNOSIS — S14155S Other incomplete lesion at C5 level of cervical spinal cord, sequela: Secondary | ICD-10-CM | POA: Diagnosis not present

## 2017-12-08 DIAGNOSIS — A4189 Other specified sepsis: Secondary | ICD-10-CM | POA: Diagnosis not present

## 2017-12-08 DIAGNOSIS — L89314 Pressure ulcer of right buttock, stage 4: Secondary | ICD-10-CM | POA: Diagnosis not present

## 2017-12-08 DIAGNOSIS — L8940 Pressure ulcer of contiguous site of back, buttock and hip, unspecified stage: Secondary | ICD-10-CM | POA: Diagnosis not present

## 2017-12-08 DIAGNOSIS — L089 Local infection of the skin and subcutaneous tissue, unspecified: Secondary | ICD-10-CM | POA: Diagnosis not present

## 2017-12-08 DIAGNOSIS — L128 Other pemphigoid: Secondary | ICD-10-CM | POA: Diagnosis not present

## 2017-12-08 DIAGNOSIS — R0781 Pleurodynia: Secondary | ICD-10-CM | POA: Diagnosis not present

## 2017-12-08 DIAGNOSIS — L89224 Pressure ulcer of left hip, stage 4: Secondary | ICD-10-CM | POA: Diagnosis not present

## 2017-12-08 DIAGNOSIS — M8638 Chronic multifocal osteomyelitis, other site: Secondary | ICD-10-CM | POA: Diagnosis not present

## 2017-12-08 DIAGNOSIS — R1111 Vomiting without nausea: Secondary | ICD-10-CM | POA: Diagnosis not present

## 2017-12-08 DIAGNOSIS — R7881 Bacteremia: Secondary | ICD-10-CM | POA: Diagnosis not present

## 2017-12-08 DIAGNOSIS — E46 Unspecified protein-calorie malnutrition: Secondary | ICD-10-CM | POA: Diagnosis not present

## 2017-12-08 DIAGNOSIS — L89324 Pressure ulcer of left buttock, stage 4: Secondary | ICD-10-CM | POA: Diagnosis not present

## 2017-12-08 DIAGNOSIS — K8010 Calculus of gallbladder with chronic cholecystitis without obstruction: Secondary | ICD-10-CM | POA: Diagnosis not present

## 2017-12-08 DIAGNOSIS — A411 Sepsis due to other specified staphylococcus: Secondary | ICD-10-CM | POA: Diagnosis not present

## 2017-12-08 DIAGNOSIS — G8250 Quadriplegia, unspecified: Secondary | ICD-10-CM | POA: Diagnosis not present

## 2017-12-08 DIAGNOSIS — J9811 Atelectasis: Secondary | ICD-10-CM | POA: Diagnosis not present

## 2017-12-08 DIAGNOSIS — R509 Fever, unspecified: Secondary | ICD-10-CM | POA: Diagnosis not present

## 2017-12-08 DIAGNOSIS — L89304 Pressure ulcer of unspecified buttock, stage 4: Secondary | ICD-10-CM | POA: Diagnosis not present

## 2017-12-08 DIAGNOSIS — A419 Sepsis, unspecified organism: Secondary | ICD-10-CM | POA: Diagnosis not present

## 2017-12-08 DIAGNOSIS — B957 Other staphylococcus as the cause of diseases classified elsewhere: Secondary | ICD-10-CM | POA: Diagnosis not present

## 2017-12-08 DIAGNOSIS — Z8619 Personal history of other infectious and parasitic diseases: Secondary | ICD-10-CM | POA: Diagnosis not present

## 2017-12-08 DIAGNOSIS — L89153 Pressure ulcer of sacral region, stage 3: Secondary | ICD-10-CM | POA: Diagnosis not present

## 2017-12-08 DIAGNOSIS — N390 Urinary tract infection, site not specified: Secondary | ICD-10-CM | POA: Diagnosis not present

## 2017-12-08 DIAGNOSIS — R000 Tachycardia, unspecified: Secondary | ICD-10-CM | POA: Diagnosis not present

## 2017-12-08 DIAGNOSIS — L8945 Pressure ulcer of contiguous site of back, buttock and hip, unstageable: Secondary | ICD-10-CM | POA: Diagnosis not present

## 2017-12-08 DIAGNOSIS — L89154 Pressure ulcer of sacral region, stage 4: Secondary | ICD-10-CM | POA: Diagnosis not present

## 2017-12-08 DIAGNOSIS — L89229 Pressure ulcer of left hip, unspecified stage: Secondary | ICD-10-CM | POA: Diagnosis not present

## 2017-12-08 DIAGNOSIS — B964 Proteus (mirabilis) (morganii) as the cause of diseases classified elsewhere: Secondary | ICD-10-CM | POA: Diagnosis not present

## 2017-12-08 DIAGNOSIS — K810 Acute cholecystitis: Secondary | ICD-10-CM | POA: Diagnosis not present

## 2017-12-08 DIAGNOSIS — R079 Chest pain, unspecified: Secondary | ICD-10-CM | POA: Diagnosis not present

## 2017-12-08 DIAGNOSIS — D72829 Elevated white blood cell count, unspecified: Secondary | ICD-10-CM | POA: Diagnosis not present

## 2017-12-08 DIAGNOSIS — L89899 Pressure ulcer of other site, unspecified stage: Secondary | ICD-10-CM | POA: Diagnosis not present

## 2017-12-08 DIAGNOSIS — L89220 Pressure ulcer of left hip, unstageable: Secondary | ICD-10-CM | POA: Diagnosis not present

## 2017-12-08 DIAGNOSIS — Z5331 Laparoscopic surgical procedure converted to open procedure: Secondary | ICD-10-CM | POA: Diagnosis not present

## 2017-12-08 DIAGNOSIS — I96 Gangrene, not elsewhere classified: Secondary | ICD-10-CM | POA: Diagnosis not present

## 2017-12-08 DIAGNOSIS — L89159 Pressure ulcer of sacral region, unspecified stage: Secondary | ICD-10-CM | POA: Diagnosis not present

## 2017-12-08 DIAGNOSIS — Z7401 Bed confinement status: Secondary | ICD-10-CM | POA: Diagnosis not present

## 2017-12-08 DIAGNOSIS — K811 Chronic cholecystitis: Secondary | ICD-10-CM | POA: Diagnosis not present

## 2017-12-09 DIAGNOSIS — L89890 Pressure ulcer of other site, unstageable: Secondary | ICD-10-CM | POA: Diagnosis not present

## 2017-12-09 DIAGNOSIS — M8669 Other chronic osteomyelitis, multiple sites: Secondary | ICD-10-CM | POA: Diagnosis not present

## 2017-12-09 DIAGNOSIS — L89214 Pressure ulcer of right hip, stage 4: Secondary | ICD-10-CM | POA: Diagnosis not present

## 2017-12-09 DIAGNOSIS — A419 Sepsis, unspecified organism: Secondary | ICD-10-CM | POA: Diagnosis not present

## 2017-12-09 IMAGING — CR DG CHEST 2V
3 series · 3 of 3 positions shown · non-contrast
Comparison: Chest x-ray 03/16/2015.

CLINICAL DATA: 49-year-old male with weakness, fever, shortness of
breath and lethargy.

EXAM:
CHEST  2 VIEW

[x chest ap (1 of 2)]
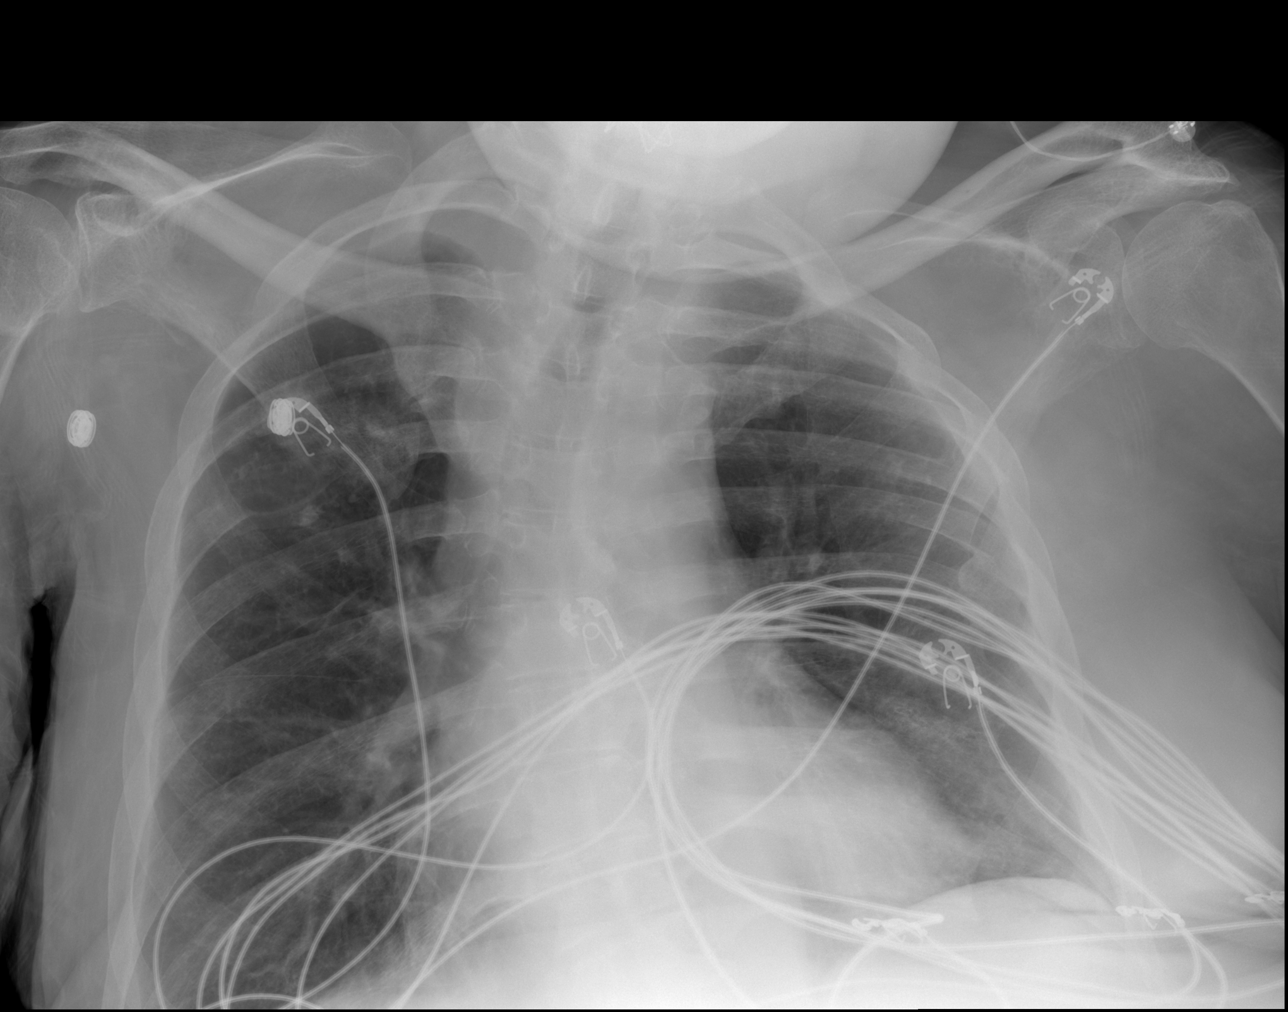

[x chest ap (2 of 2)]
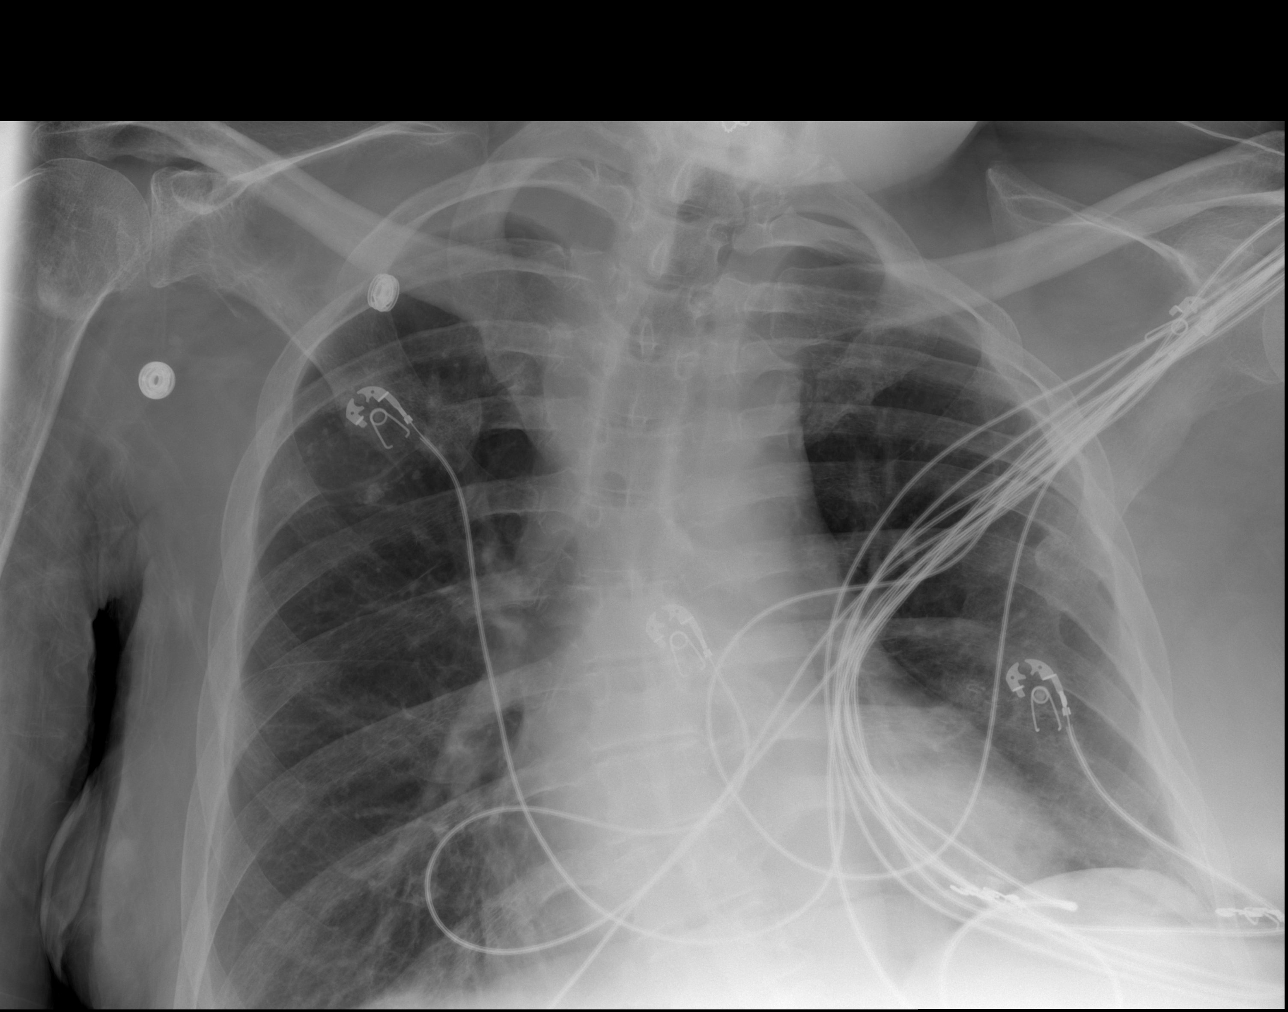

[w chest lat]
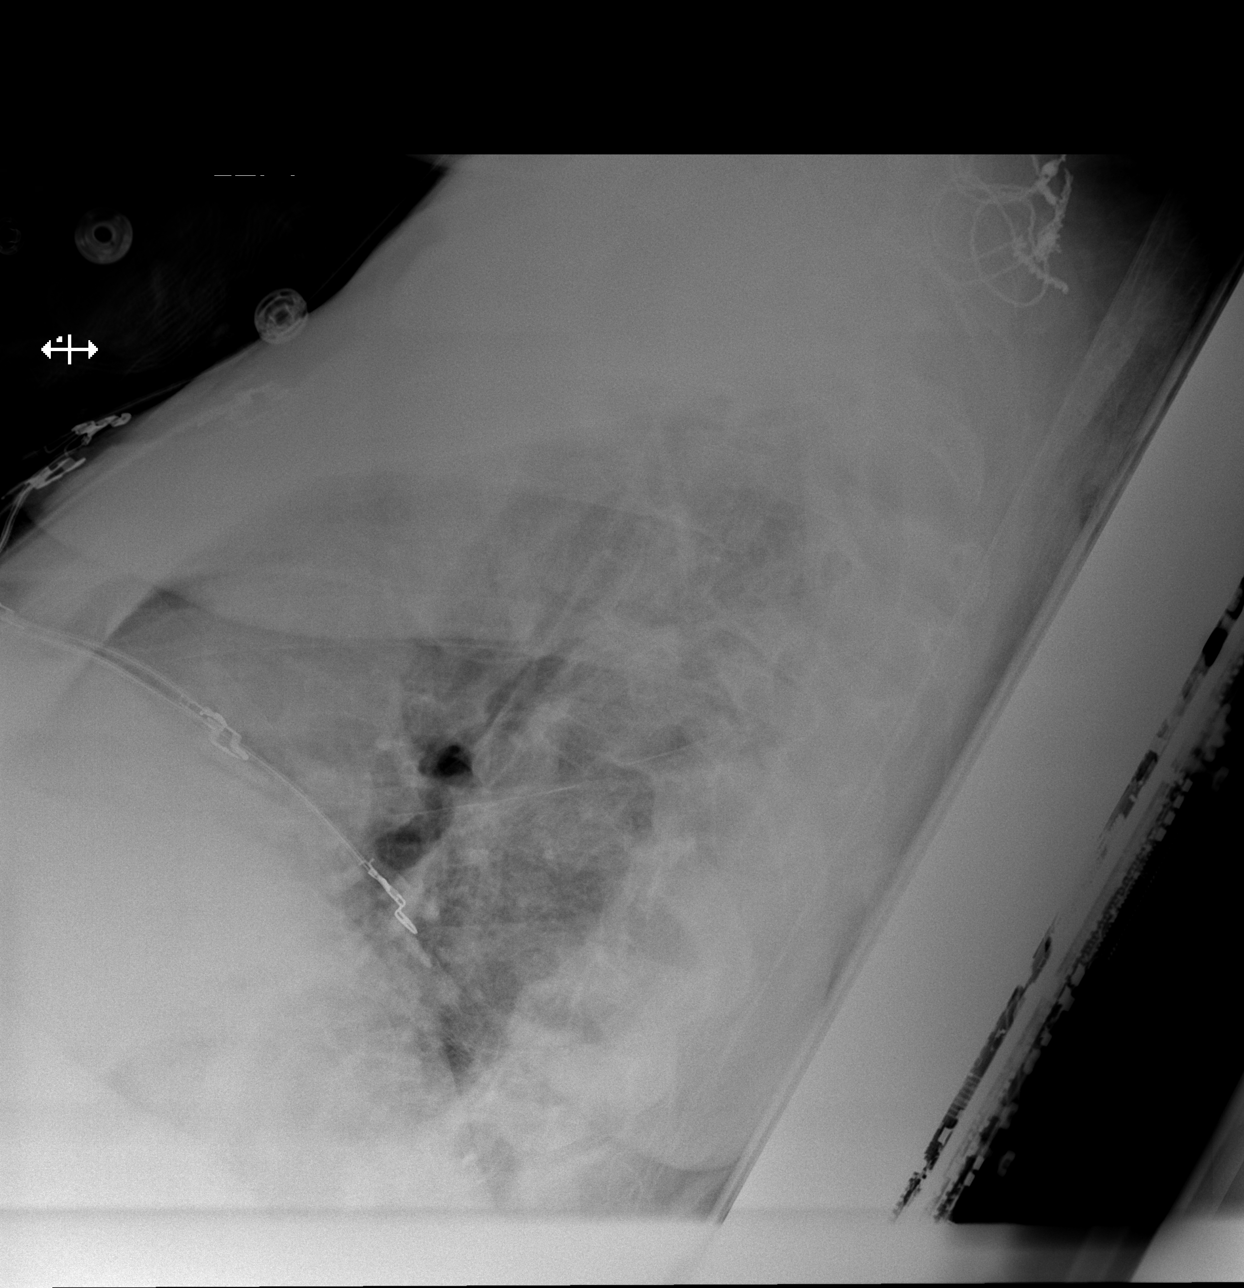

[3 of 3 positions shown; findings below may reference images not displayed]

FINDINGS: Calcified granulomas and scarring in the right upper lobe. Lung
volumes are normal. No consolidative airspace disease. No pleural
effusions. No pneumothorax. No other suspicious appearing pulmonary
nodule or mass noted. Pulmonary vasculature is normal. Heart size is
normal. The patient is rotated to the left on today's exam,
resulting in distortion of the mediastinal contours and reduced
diagnostic sensitivity and specificity for mediastinal pathology.
IMPRESSION: 1. No radiographic evidence of acute cardiopulmonary disease.
2. Sequela of old granulomatous disease redemonstrated, as above.

## 2017-12-10 DIAGNOSIS — L89153 Pressure ulcer of sacral region, stage 3: Secondary | ICD-10-CM | POA: Diagnosis not present

## 2017-12-10 DIAGNOSIS — I96 Gangrene, not elsewhere classified: Secondary | ICD-10-CM | POA: Diagnosis not present

## 2017-12-10 DIAGNOSIS — Z87440 Personal history of urinary (tract) infections: Secondary | ICD-10-CM | POA: Diagnosis not present

## 2017-12-10 DIAGNOSIS — L89893 Pressure ulcer of other site, stage 3: Secondary | ICD-10-CM | POA: Diagnosis not present

## 2017-12-10 DIAGNOSIS — A419 Sepsis, unspecified organism: Secondary | ICD-10-CM | POA: Diagnosis not present

## 2017-12-10 DIAGNOSIS — L8940 Pressure ulcer of contiguous site of back, buttock and hip, unspecified stage: Secondary | ICD-10-CM | POA: Diagnosis not present

## 2017-12-10 DIAGNOSIS — L89229 Pressure ulcer of left hip, unspecified stage: Secondary | ICD-10-CM | POA: Diagnosis not present

## 2017-12-10 DIAGNOSIS — Z933 Colostomy status: Secondary | ICD-10-CM

## 2017-12-10 DIAGNOSIS — L89159 Pressure ulcer of sacral region, unspecified stage: Secondary | ICD-10-CM | POA: Diagnosis not present

## 2017-12-10 DIAGNOSIS — L089 Local infection of the skin and subcutaneous tissue, unspecified: Secondary | ICD-10-CM | POA: Diagnosis not present

## 2017-12-10 IMAGING — US IR FLUORO GUIDE CV LINE*L*
1 series · 1 of 1 positions shown · non-contrast
Comparison: none

CLINICAL DATA: 49-year-old with poor venous access and pressure
wound.

EXAM:
PLACEMENT OF LEFT ARM PICC LINE WITH ULTRASOUND AND FLUOROSCOPIC
GUIDANCE
FLUOROSCOPY TIME:  1 minutes and 12 seconds
TECHNIQUE: The procedure was explained to the patient. The risks and benefits
of the procedure were discussed and the patient's questions were
addressed. Informed consent was obtained from the patient. The left
arm was prepped with chlorhexidine, draped in the usual sterile
fashion using maximum barrier technique (cap and mask, sterile gown,
sterile gloves, large sterile sheet, hand hygiene and cutaneous
antiseptic). Local anesthesia was attained by infiltration with 1%
lidocaine.

[Series 1: ir fluoro/shunt/fist · 1 of 1 slices shown]
[im 1/1]
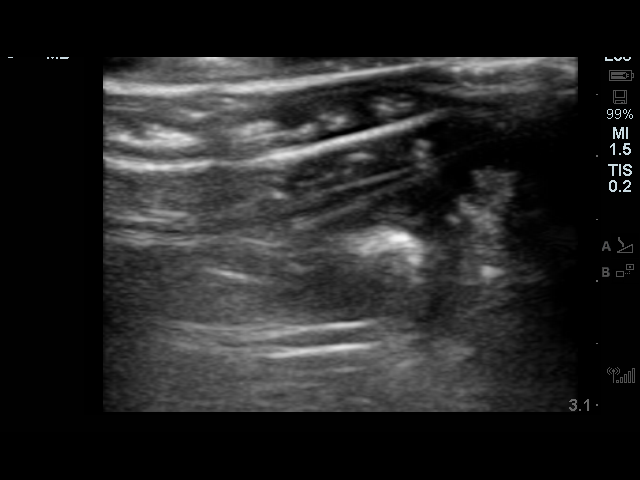

[1 of 1 positions shown; findings below may reference images not displayed]

Ultrasound demonstrated patency of the left brachial vein, and this
was documented with an image. Under real-time ultrasound guidance,
this vein was accessed with a 21 gauge micropuncture needle and
image documentation was performed. The needle was exchanged over a
guidewire for a peel-away sheath through which a 53 cm 5 French dual
lumen power injectable PICC was advanced, and positioned with its
tip at the lower SVC/right atrial junction. Fluoroscopy during the
procedure and fluoro spot radiograph confirms appropriate catheter
position. The catheter was flushed, secured to the skin with Prolene
sutures, and covered with a sterile dressing.

COMPLICATIONS:
None.  The patient tolerated the procedure well.
IMPRESSION: Successful placement of a left arm PICC with sonographic and
fluoroscopic guidance. The catheter is ready for use.

## 2017-12-11 DIAGNOSIS — B964 Proteus (mirabilis) (morganii) as the cause of diseases classified elsewhere: Secondary | ICD-10-CM | POA: Diagnosis not present

## 2017-12-11 DIAGNOSIS — K8010 Calculus of gallbladder with chronic cholecystitis without obstruction: Secondary | ICD-10-CM | POA: Diagnosis not present

## 2017-12-11 DIAGNOSIS — L089 Local infection of the skin and subcutaneous tissue, unspecified: Secondary | ICD-10-CM | POA: Diagnosis not present

## 2017-12-11 DIAGNOSIS — A419 Sepsis, unspecified organism: Secondary | ICD-10-CM | POA: Diagnosis not present

## 2017-12-11 DIAGNOSIS — L8940 Pressure ulcer of contiguous site of back, buttock and hip, unspecified stage: Secondary | ICD-10-CM | POA: Diagnosis not present

## 2017-12-11 DIAGNOSIS — K8012 Calculus of gallbladder with acute and chronic cholecystitis without obstruction: Secondary | ICD-10-CM | POA: Diagnosis not present

## 2017-12-12 DIAGNOSIS — A411 Sepsis due to other specified staphylococcus: Secondary | ICD-10-CM | POA: Diagnosis not present

## 2017-12-12 DIAGNOSIS — L089 Local infection of the skin and subcutaneous tissue, unspecified: Secondary | ICD-10-CM | POA: Diagnosis not present

## 2017-12-12 DIAGNOSIS — A4159 Other Gram-negative sepsis: Secondary | ICD-10-CM | POA: Diagnosis not present

## 2017-12-12 DIAGNOSIS — A4189 Other specified sepsis: Secondary | ICD-10-CM | POA: Diagnosis not present

## 2017-12-13 DIAGNOSIS — L89890 Pressure ulcer of other site, unstageable: Secondary | ICD-10-CM | POA: Diagnosis not present

## 2017-12-13 DIAGNOSIS — L89154 Pressure ulcer of sacral region, stage 4: Secondary | ICD-10-CM | POA: Diagnosis not present

## 2017-12-13 DIAGNOSIS — L89224 Pressure ulcer of left hip, stage 4: Secondary | ICD-10-CM | POA: Diagnosis not present

## 2017-12-13 DIAGNOSIS — A411 Sepsis due to other specified staphylococcus: Secondary | ICD-10-CM | POA: Diagnosis not present

## 2017-12-13 DIAGNOSIS — L089 Local infection of the skin and subcutaneous tissue, unspecified: Secondary | ICD-10-CM | POA: Diagnosis not present

## 2017-12-13 DIAGNOSIS — A4159 Other Gram-negative sepsis: Secondary | ICD-10-CM | POA: Diagnosis not present

## 2017-12-13 DIAGNOSIS — A4189 Other specified sepsis: Secondary | ICD-10-CM | POA: Diagnosis not present

## 2017-12-13 DIAGNOSIS — K812 Acute cholecystitis with chronic cholecystitis: Secondary | ICD-10-CM | POA: Diagnosis not present

## 2017-12-14 DIAGNOSIS — L89154 Pressure ulcer of sacral region, stage 4: Secondary | ICD-10-CM | POA: Diagnosis not present

## 2017-12-14 DIAGNOSIS — L89214 Pressure ulcer of right hip, stage 4: Secondary | ICD-10-CM | POA: Diagnosis not present

## 2017-12-14 DIAGNOSIS — B957 Other staphylococcus as the cause of diseases classified elsewhere: Secondary | ICD-10-CM | POA: Diagnosis not present

## 2017-12-14 DIAGNOSIS — R7881 Bacteremia: Secondary | ICD-10-CM | POA: Diagnosis not present

## 2017-12-14 DIAGNOSIS — M8669 Other chronic osteomyelitis, multiple sites: Secondary | ICD-10-CM | POA: Diagnosis not present

## 2017-12-14 DIAGNOSIS — L89899 Pressure ulcer of other site, unspecified stage: Secondary | ICD-10-CM | POA: Diagnosis not present

## 2017-12-14 DIAGNOSIS — K8012 Calculus of gallbladder with acute and chronic cholecystitis without obstruction: Secondary | ICD-10-CM | POA: Diagnosis not present

## 2017-12-15 DIAGNOSIS — B957 Other staphylococcus as the cause of diseases classified elsewhere: Secondary | ICD-10-CM | POA: Diagnosis not present

## 2017-12-15 DIAGNOSIS — R509 Fever, unspecified: Secondary | ICD-10-CM | POA: Diagnosis not present

## 2017-12-15 DIAGNOSIS — R7881 Bacteremia: Secondary | ICD-10-CM | POA: Diagnosis not present

## 2017-12-15 DIAGNOSIS — K810 Acute cholecystitis: Secondary | ICD-10-CM | POA: Diagnosis not present

## 2017-12-15 DIAGNOSIS — K811 Chronic cholecystitis: Secondary | ICD-10-CM | POA: Diagnosis not present

## 2017-12-15 DIAGNOSIS — D72829 Elevated white blood cell count, unspecified: Secondary | ICD-10-CM | POA: Diagnosis not present

## 2017-12-15 DIAGNOSIS — M8669 Other chronic osteomyelitis, multiple sites: Secondary | ICD-10-CM | POA: Diagnosis not present

## 2017-12-16 DIAGNOSIS — R7881 Bacteremia: Secondary | ICD-10-CM | POA: Diagnosis not present

## 2017-12-16 DIAGNOSIS — D72829 Elevated white blood cell count, unspecified: Secondary | ICD-10-CM | POA: Diagnosis not present

## 2017-12-16 DIAGNOSIS — R509 Fever, unspecified: Secondary | ICD-10-CM | POA: Diagnosis not present

## 2017-12-16 DIAGNOSIS — K810 Acute cholecystitis: Secondary | ICD-10-CM | POA: Diagnosis not present

## 2017-12-16 DIAGNOSIS — B957 Other staphylococcus as the cause of diseases classified elsewhere: Secondary | ICD-10-CM | POA: Diagnosis not present

## 2017-12-16 DIAGNOSIS — K811 Chronic cholecystitis: Secondary | ICD-10-CM | POA: Diagnosis not present

## 2017-12-17 DIAGNOSIS — R079 Chest pain, unspecified: Secondary | ICD-10-CM | POA: Diagnosis not present

## 2017-12-17 DIAGNOSIS — B957 Other staphylococcus as the cause of diseases classified elsewhere: Secondary | ICD-10-CM | POA: Diagnosis not present

## 2017-12-17 DIAGNOSIS — R509 Fever, unspecified: Secondary | ICD-10-CM | POA: Diagnosis not present

## 2017-12-17 DIAGNOSIS — R7881 Bacteremia: Secondary | ICD-10-CM | POA: Diagnosis not present

## 2017-12-17 DIAGNOSIS — M8669 Other chronic osteomyelitis, multiple sites: Secondary | ICD-10-CM | POA: Diagnosis not present

## 2017-12-18 DIAGNOSIS — L89214 Pressure ulcer of right hip, stage 4: Secondary | ICD-10-CM | POA: Diagnosis not present

## 2017-12-18 DIAGNOSIS — R7881 Bacteremia: Secondary | ICD-10-CM | POA: Diagnosis not present

## 2017-12-18 DIAGNOSIS — L089 Local infection of the skin and subcutaneous tissue, unspecified: Secondary | ICD-10-CM | POA: Diagnosis not present

## 2017-12-18 DIAGNOSIS — B957 Other staphylococcus as the cause of diseases classified elsewhere: Secondary | ICD-10-CM | POA: Diagnosis not present

## 2017-12-18 DIAGNOSIS — L89890 Pressure ulcer of other site, unstageable: Secondary | ICD-10-CM | POA: Diagnosis not present

## 2017-12-18 DIAGNOSIS — L89154 Pressure ulcer of sacral region, stage 4: Secondary | ICD-10-CM | POA: Diagnosis not present

## 2017-12-18 DIAGNOSIS — R509 Fever, unspecified: Secondary | ICD-10-CM | POA: Diagnosis not present

## 2017-12-18 DIAGNOSIS — D72829 Elevated white blood cell count, unspecified: Secondary | ICD-10-CM | POA: Diagnosis not present

## 2017-12-19 DIAGNOSIS — D72829 Elevated white blood cell count, unspecified: Secondary | ICD-10-CM | POA: Diagnosis not present

## 2017-12-19 DIAGNOSIS — L89304 Pressure ulcer of unspecified buttock, stage 4: Secondary | ICD-10-CM | POA: Diagnosis not present

## 2017-12-19 DIAGNOSIS — B957 Other staphylococcus as the cause of diseases classified elsewhere: Secondary | ICD-10-CM | POA: Diagnosis not present

## 2017-12-19 DIAGNOSIS — R509 Fever, unspecified: Secondary | ICD-10-CM | POA: Diagnosis not present

## 2017-12-19 DIAGNOSIS — L8945 Pressure ulcer of contiguous site of back, buttock and hip, unstageable: Secondary | ICD-10-CM | POA: Diagnosis not present

## 2017-12-19 DIAGNOSIS — R7881 Bacteremia: Secondary | ICD-10-CM | POA: Diagnosis not present

## 2017-12-20 DIAGNOSIS — R7881 Bacteremia: Secondary | ICD-10-CM | POA: Diagnosis not present

## 2017-12-20 DIAGNOSIS — L8945 Pressure ulcer of contiguous site of back, buttock and hip, unstageable: Secondary | ICD-10-CM | POA: Diagnosis not present

## 2017-12-20 DIAGNOSIS — R509 Fever, unspecified: Secondary | ICD-10-CM | POA: Diagnosis not present

## 2017-12-20 DIAGNOSIS — D72829 Elevated white blood cell count, unspecified: Secondary | ICD-10-CM | POA: Diagnosis not present

## 2017-12-20 DIAGNOSIS — L89304 Pressure ulcer of unspecified buttock, stage 4: Secondary | ICD-10-CM | POA: Diagnosis not present

## 2017-12-20 DIAGNOSIS — B957 Other staphylococcus as the cause of diseases classified elsewhere: Secondary | ICD-10-CM | POA: Diagnosis not present

## 2017-12-21 DIAGNOSIS — L89154 Pressure ulcer of sacral region, stage 4: Secondary | ICD-10-CM | POA: Diagnosis not present

## 2017-12-21 DIAGNOSIS — L89890 Pressure ulcer of other site, unstageable: Secondary | ICD-10-CM | POA: Diagnosis not present

## 2017-12-21 DIAGNOSIS — M8668 Other chronic osteomyelitis, other site: Secondary | ICD-10-CM | POA: Diagnosis not present

## 2017-12-21 DIAGNOSIS — L89214 Pressure ulcer of right hip, stage 4: Secondary | ICD-10-CM | POA: Diagnosis not present

## 2017-12-21 DIAGNOSIS — A411 Sepsis due to other specified staphylococcus: Secondary | ICD-10-CM | POA: Diagnosis not present

## 2017-12-21 DIAGNOSIS — L89324 Pressure ulcer of left buttock, stage 4: Secondary | ICD-10-CM | POA: Diagnosis not present

## 2017-12-21 DIAGNOSIS — K812 Acute cholecystitis with chronic cholecystitis: Secondary | ICD-10-CM | POA: Diagnosis not present

## 2017-12-22 ENCOUNTER — Encounter (HOSPITAL_BASED_OUTPATIENT_CLINIC_OR_DEPARTMENT_OTHER): Payer: No Typology Code available for payment source | Attending: Internal Medicine

## 2017-12-22 DIAGNOSIS — L89324 Pressure ulcer of left buttock, stage 4: Secondary | ICD-10-CM | POA: Diagnosis not present

## 2017-12-22 DIAGNOSIS — L89154 Pressure ulcer of sacral region, stage 4: Secondary | ICD-10-CM | POA: Diagnosis not present

## 2017-12-22 DIAGNOSIS — L89890 Pressure ulcer of other site, unstageable: Secondary | ICD-10-CM | POA: Diagnosis not present

## 2017-12-22 DIAGNOSIS — K812 Acute cholecystitis with chronic cholecystitis: Secondary | ICD-10-CM | POA: Diagnosis not present

## 2017-12-22 DIAGNOSIS — M2550 Pain in unspecified joint: Secondary | ICD-10-CM | POA: Diagnosis not present

## 2017-12-22 DIAGNOSIS — A411 Sepsis due to other specified staphylococcus: Secondary | ICD-10-CM | POA: Diagnosis not present

## 2017-12-22 DIAGNOSIS — L89214 Pressure ulcer of right hip, stage 4: Secondary | ICD-10-CM | POA: Diagnosis not present

## 2017-12-22 DIAGNOSIS — Z7401 Bed confinement status: Secondary | ICD-10-CM | POA: Diagnosis not present

## 2017-12-22 DIAGNOSIS — M8669 Other chronic osteomyelitis, multiple sites: Secondary | ICD-10-CM | POA: Diagnosis not present

## 2017-12-23 IMAGING — US US RENAL
1 series · 14 of 25 positions shown · non-contrast
Comparison: CT abdomen pelvis dated February 05, 2017.

CLINICAL DATA: Fever and vomiting.

EXAM:
RENAL / URINARY TRACT ULTRASOUND COMPLETE

[Series 1: us renal · 0.33mm/px · 14 of 38 slices shown]
[im 1/38]
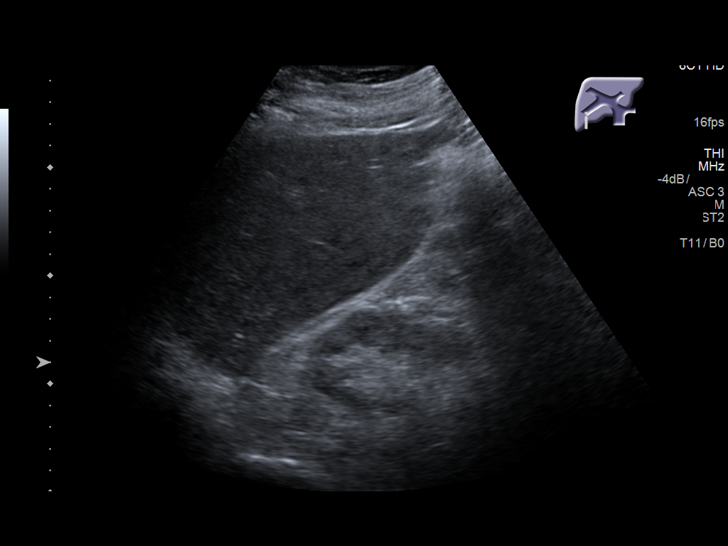
[im 4/38]
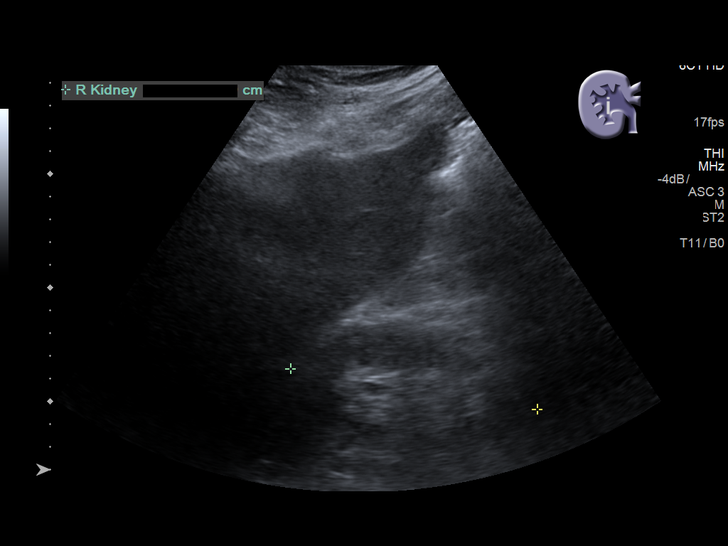
[im 7/38]
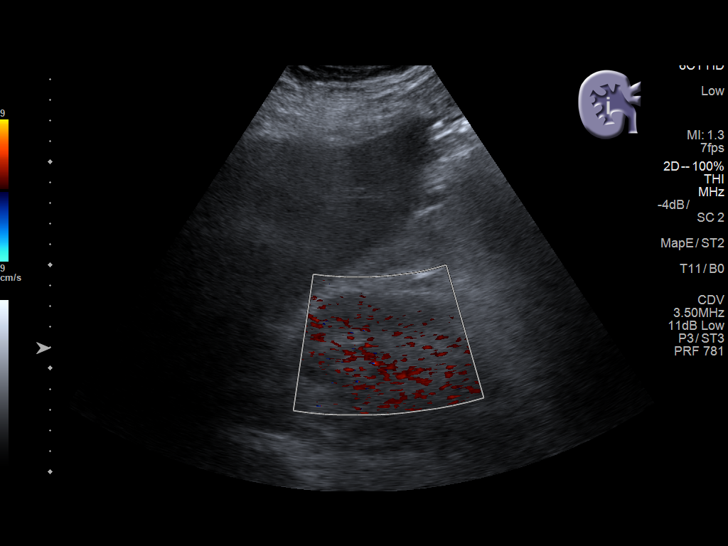
[im 10/38]
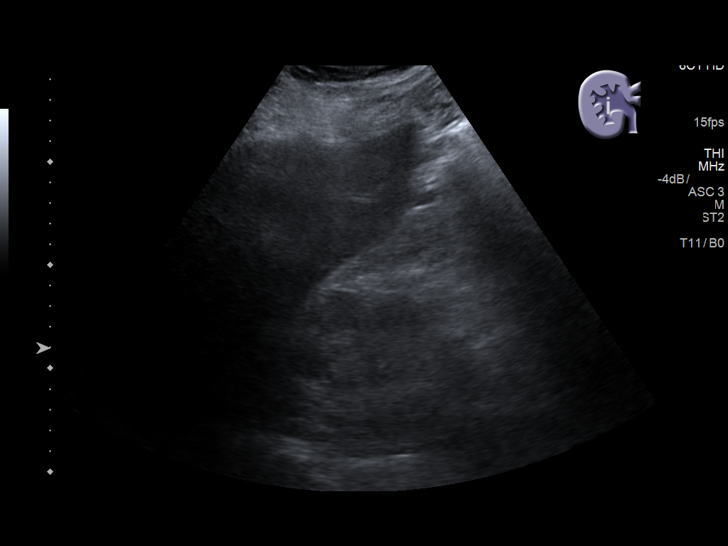
[im 13/38]
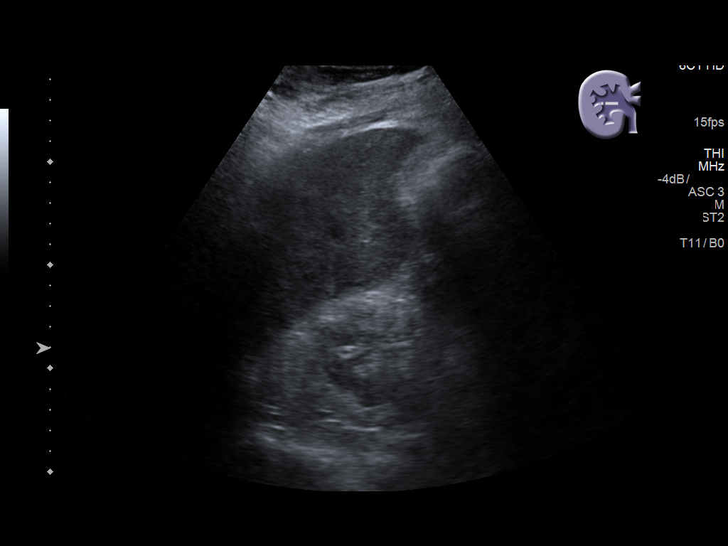
[im 14/38]
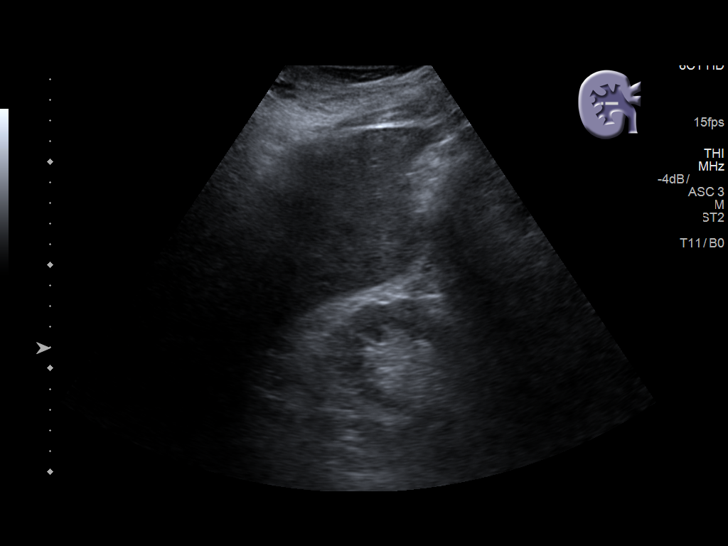
[im 17/38]
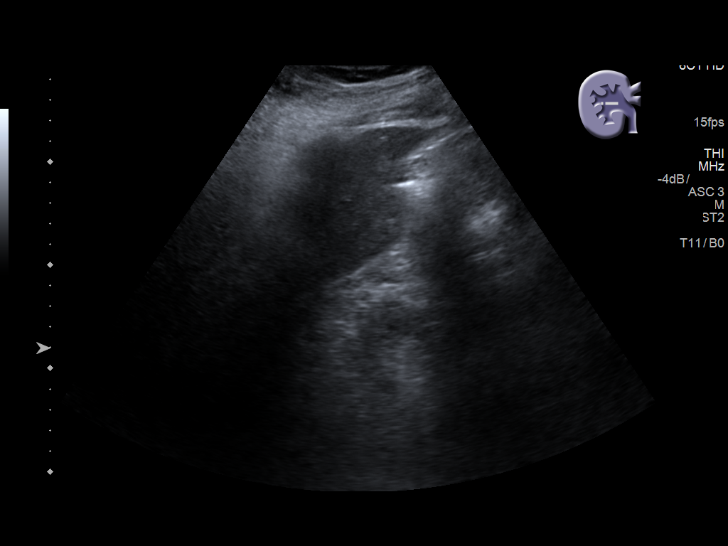
[im 21/38]
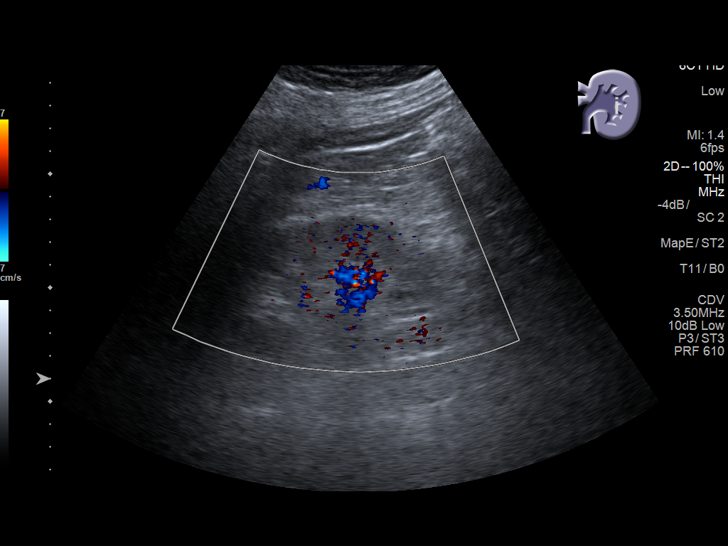
[im 24/38]
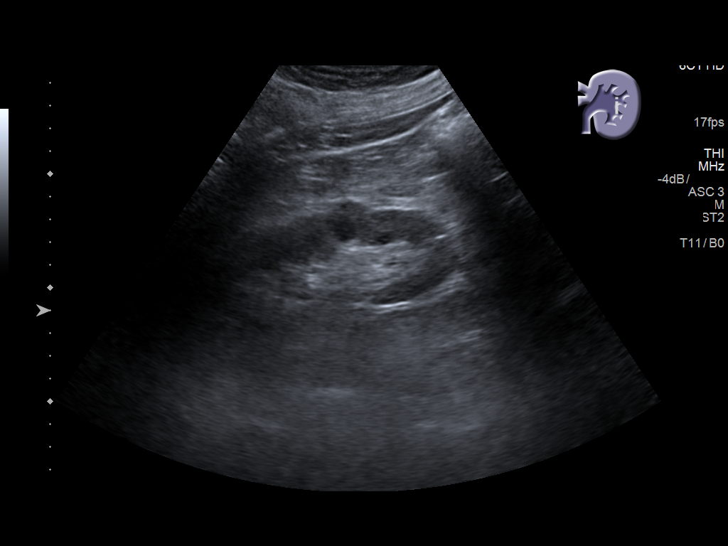
[im 25/38]
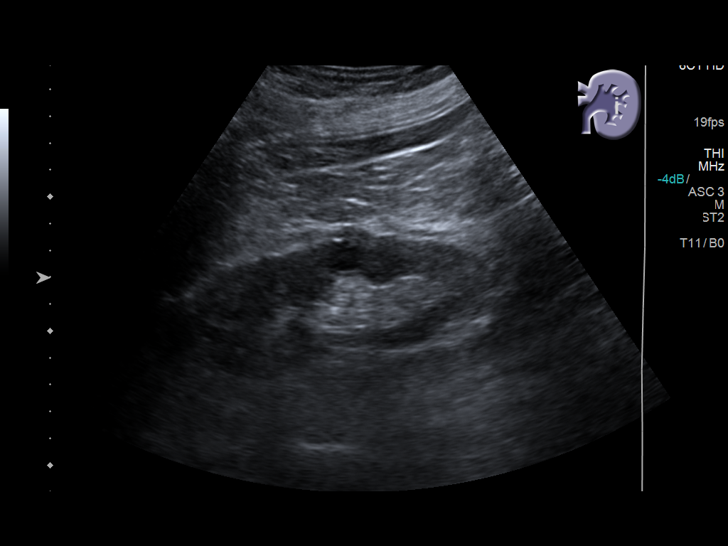
[im 28/38]
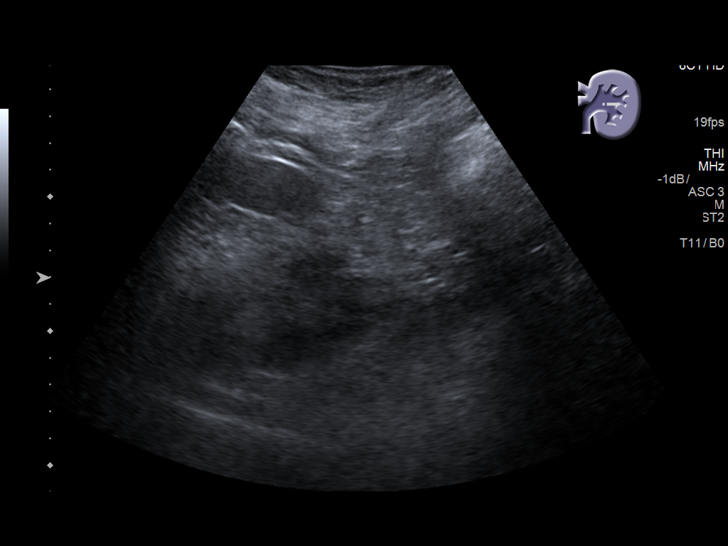
[im 31/38]
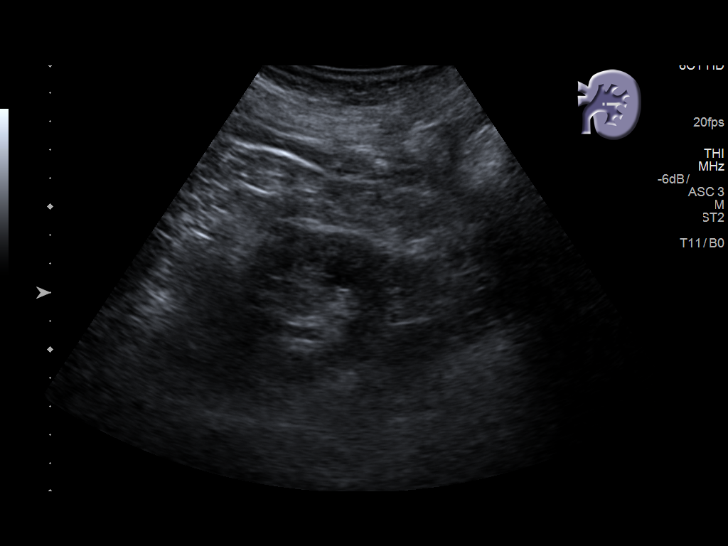
[im 34/38]
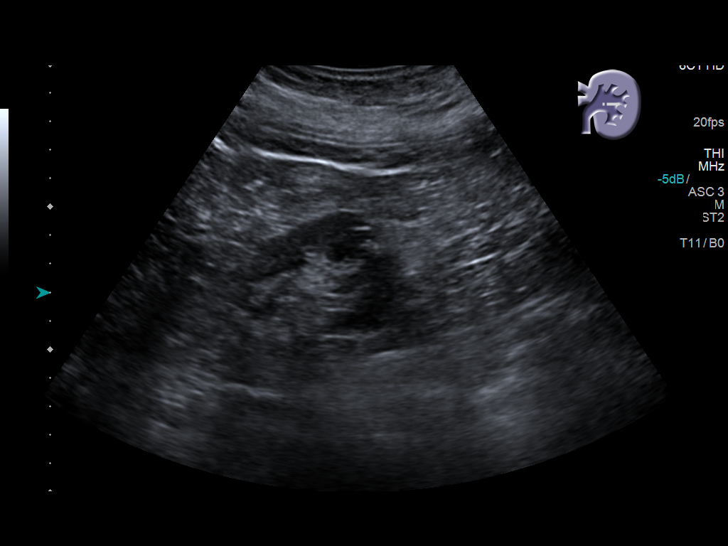
[im 38/38]
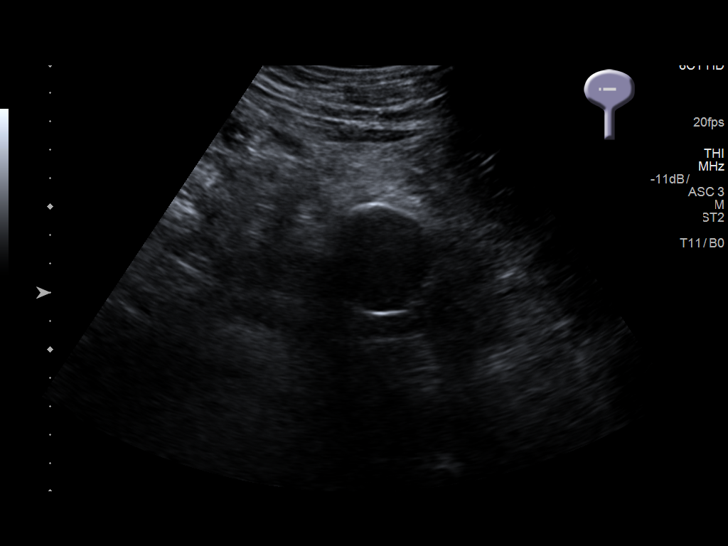

[14 of 25 positions shown; findings below may reference images not displayed]

FINDINGS: Right Kidney:

Length: 11.0 cm. Echogenicity within normal limits. No mass or
hydronephrosis visualized.

Left Kidney:

Length: 11.6 cm. Echogenicity within normal limits. No mass or
hydronephrosis visualized. Small 1.1 cm simple cyst in the midpole,
grossly unchanged when compared to prior CT.

Bladder:

Decompressed by Foley catheter.
IMPRESSION: No acute abnormality.

## 2017-12-24 IMAGING — CR DG CHEST 2V
2 series · 2 of 2 positions shown · non-contrast
Comparison: 04/27/2015

CLINICAL DATA: Hypotension

EXAM:
CHEST - 2 VIEW

[w chest lat]
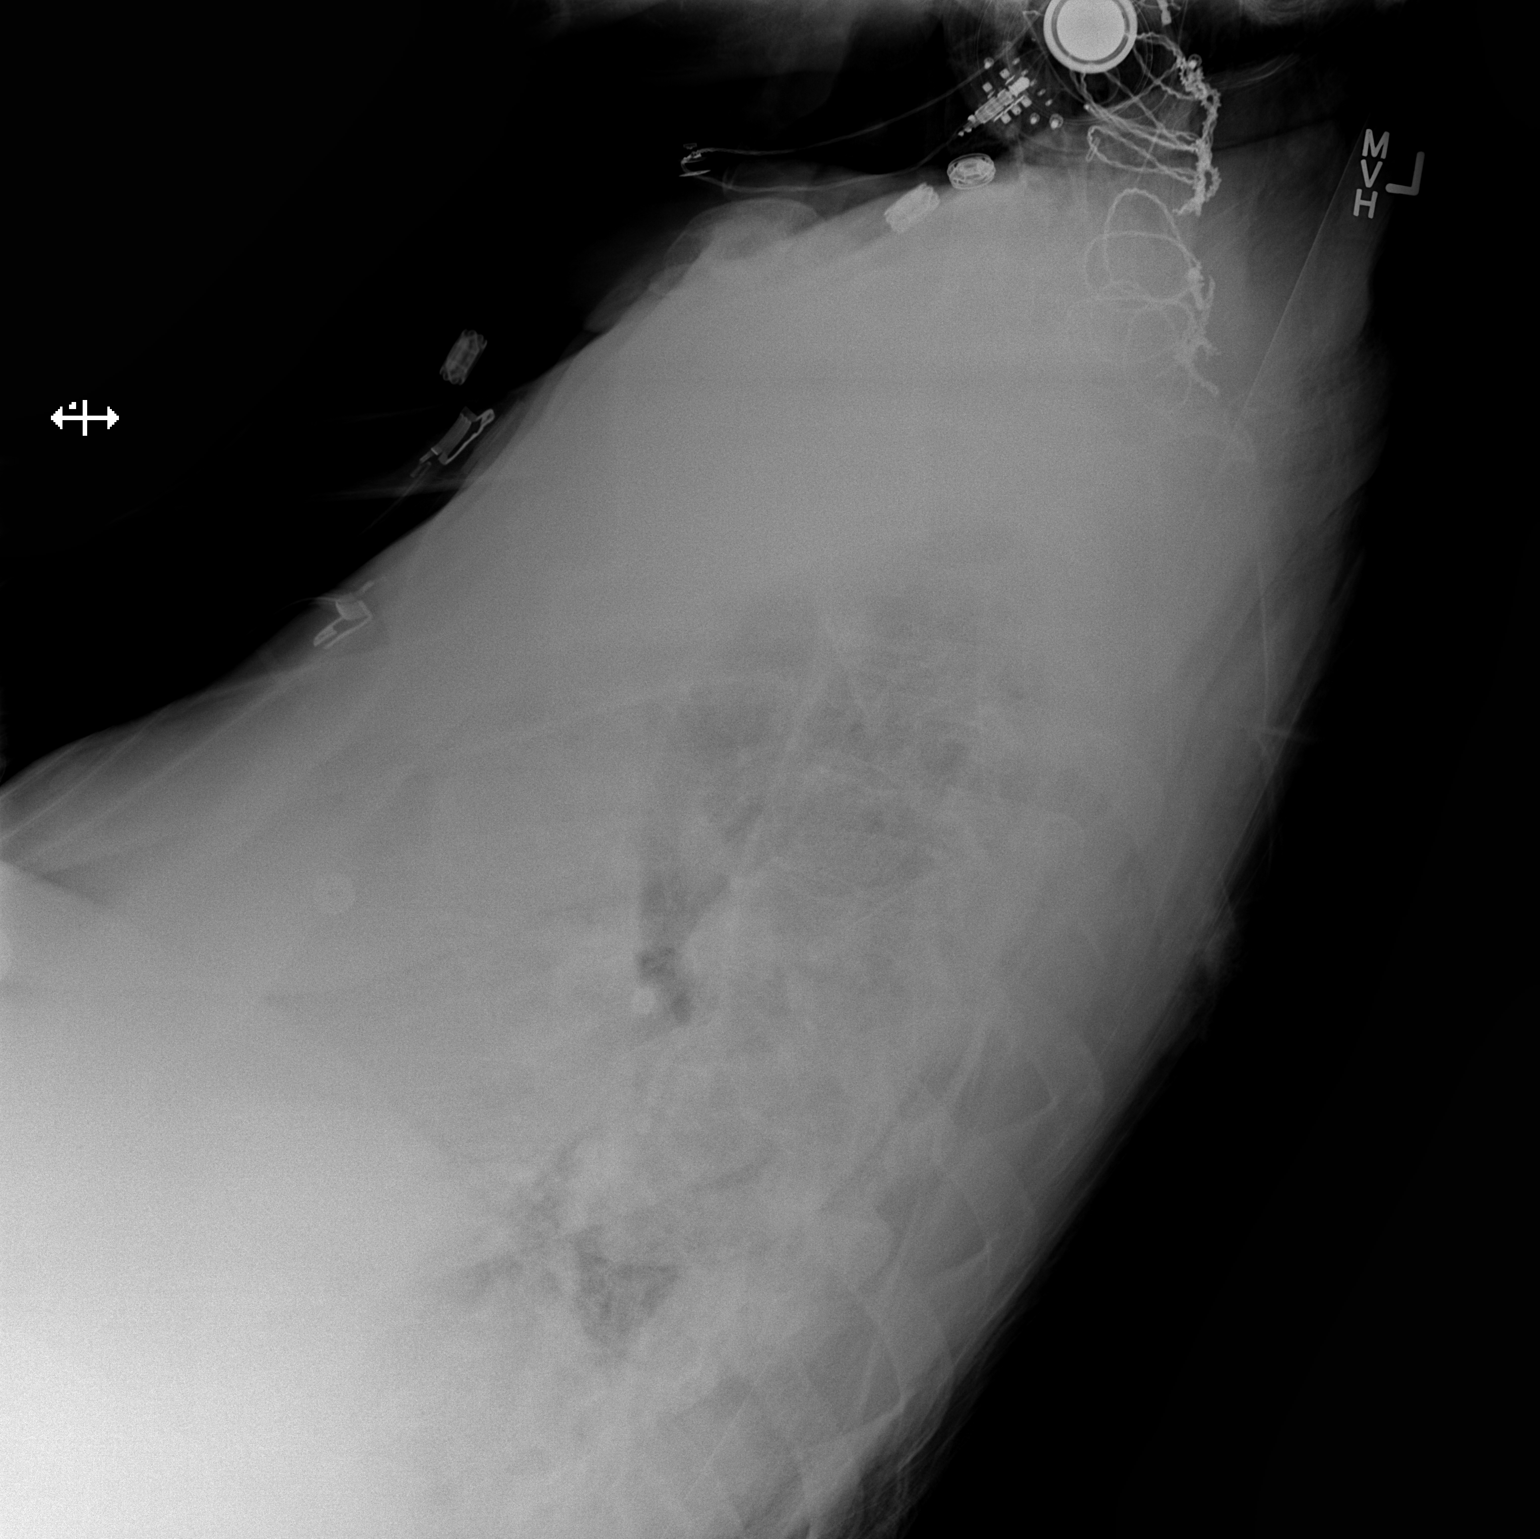

[x chest ap]
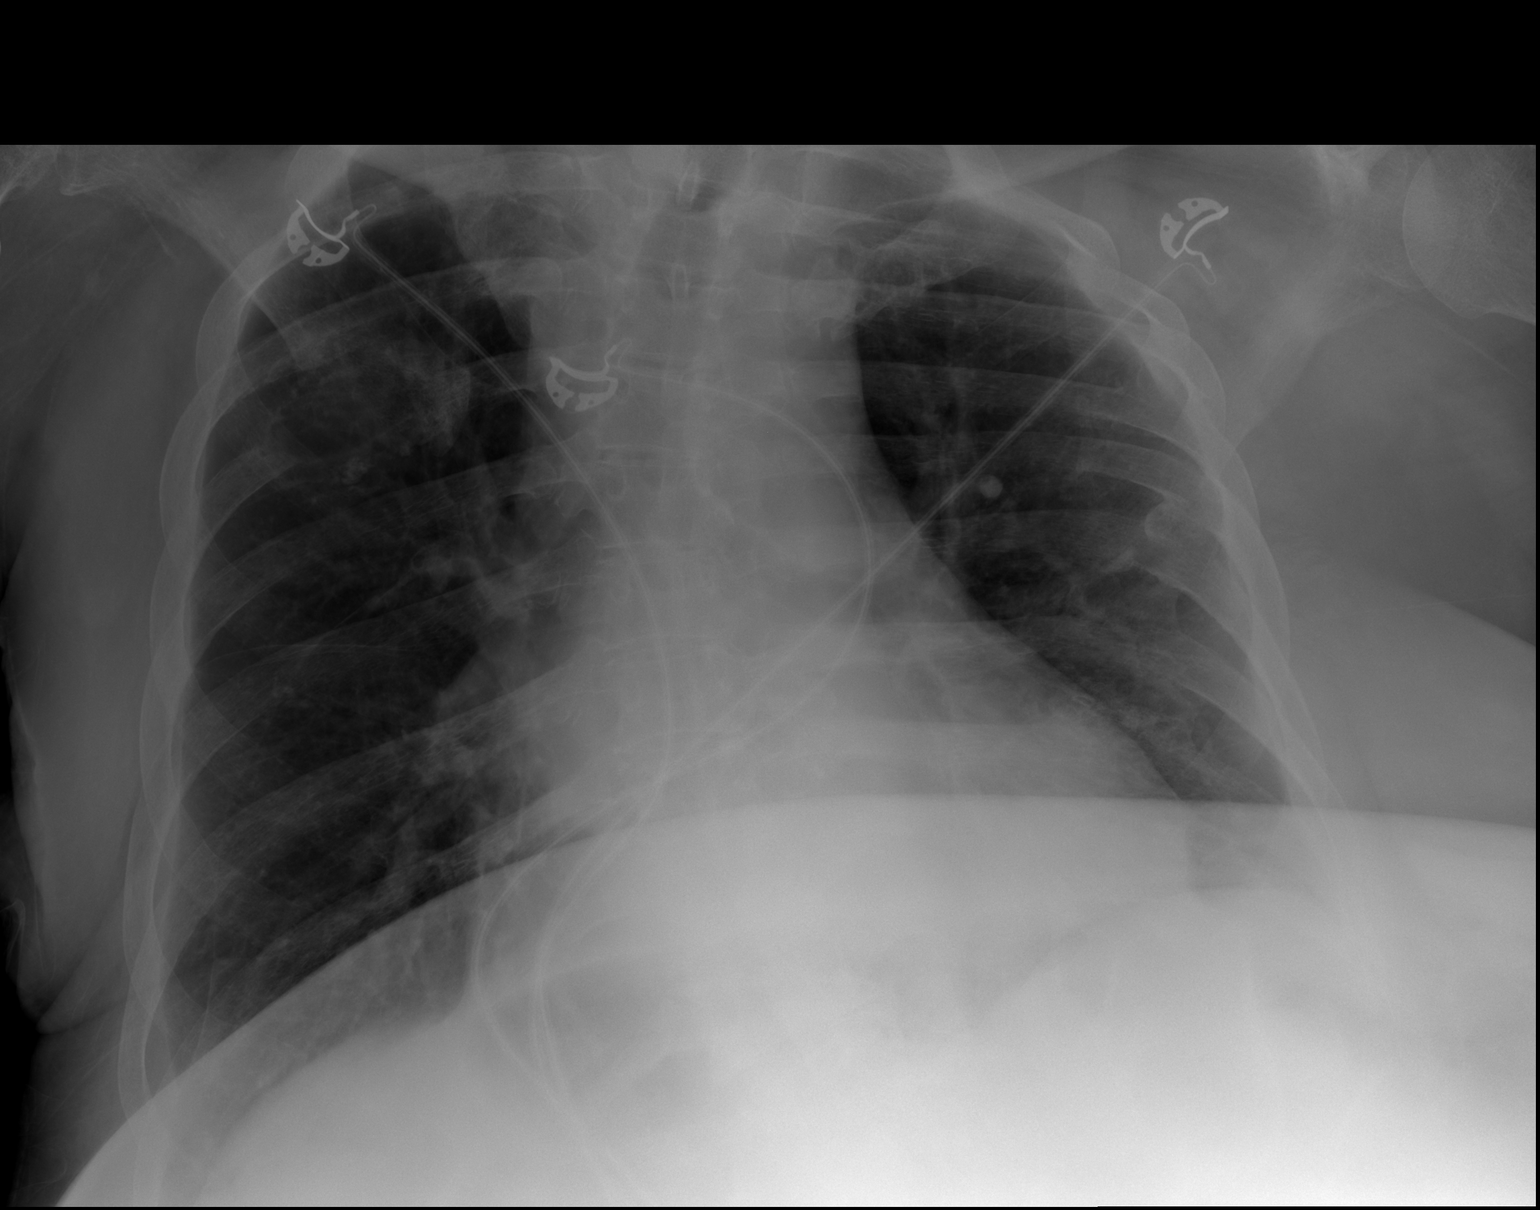

[2 of 2 positions shown; findings below may reference images not displayed]

FINDINGS: Cardiac shadow is mildly enlarged but stable. Chronic changes
involving the right fifth rib and scapula are again seen and stable.
No focal infiltrate or sizable effusion is noted.
IMPRESSION: Chronic changes without acute abnormality.

## 2017-12-27 DIAGNOSIS — R222 Localized swelling, mass and lump, trunk: Secondary | ICD-10-CM

## 2017-12-29 DIAGNOSIS — G8220 Paraplegia, unspecified: Secondary | ICD-10-CM | POA: Diagnosis not present

## 2017-12-29 DIAGNOSIS — I10 Essential (primary) hypertension: Secondary | ICD-10-CM | POA: Diagnosis not present

## 2018-01-04 DIAGNOSIS — L97513 Non-pressure chronic ulcer of other part of right foot with necrosis of muscle: Secondary | ICD-10-CM | POA: Diagnosis not present

## 2018-01-04 DIAGNOSIS — L89104 Pressure ulcer of unspecified part of back, stage 4: Secondary | ICD-10-CM | POA: Diagnosis not present

## 2018-01-04 DIAGNOSIS — L89154 Pressure ulcer of sacral region, stage 4: Secondary | ICD-10-CM | POA: Diagnosis not present

## 2018-01-04 DIAGNOSIS — L89613 Pressure ulcer of right heel, stage 3: Secondary | ICD-10-CM | POA: Diagnosis not present

## 2018-01-09 IMAGING — CR DG CHEST 2V
3 series · 3 of 3 positions shown · non-contrast
Comparison: PA and lateral chest 05/12/2015.

CLINICAL DATA: Nausea, dizziness and chills today. Initial
encounter.

EXAM:
CHEST  2 VIEW

[w chest lat]
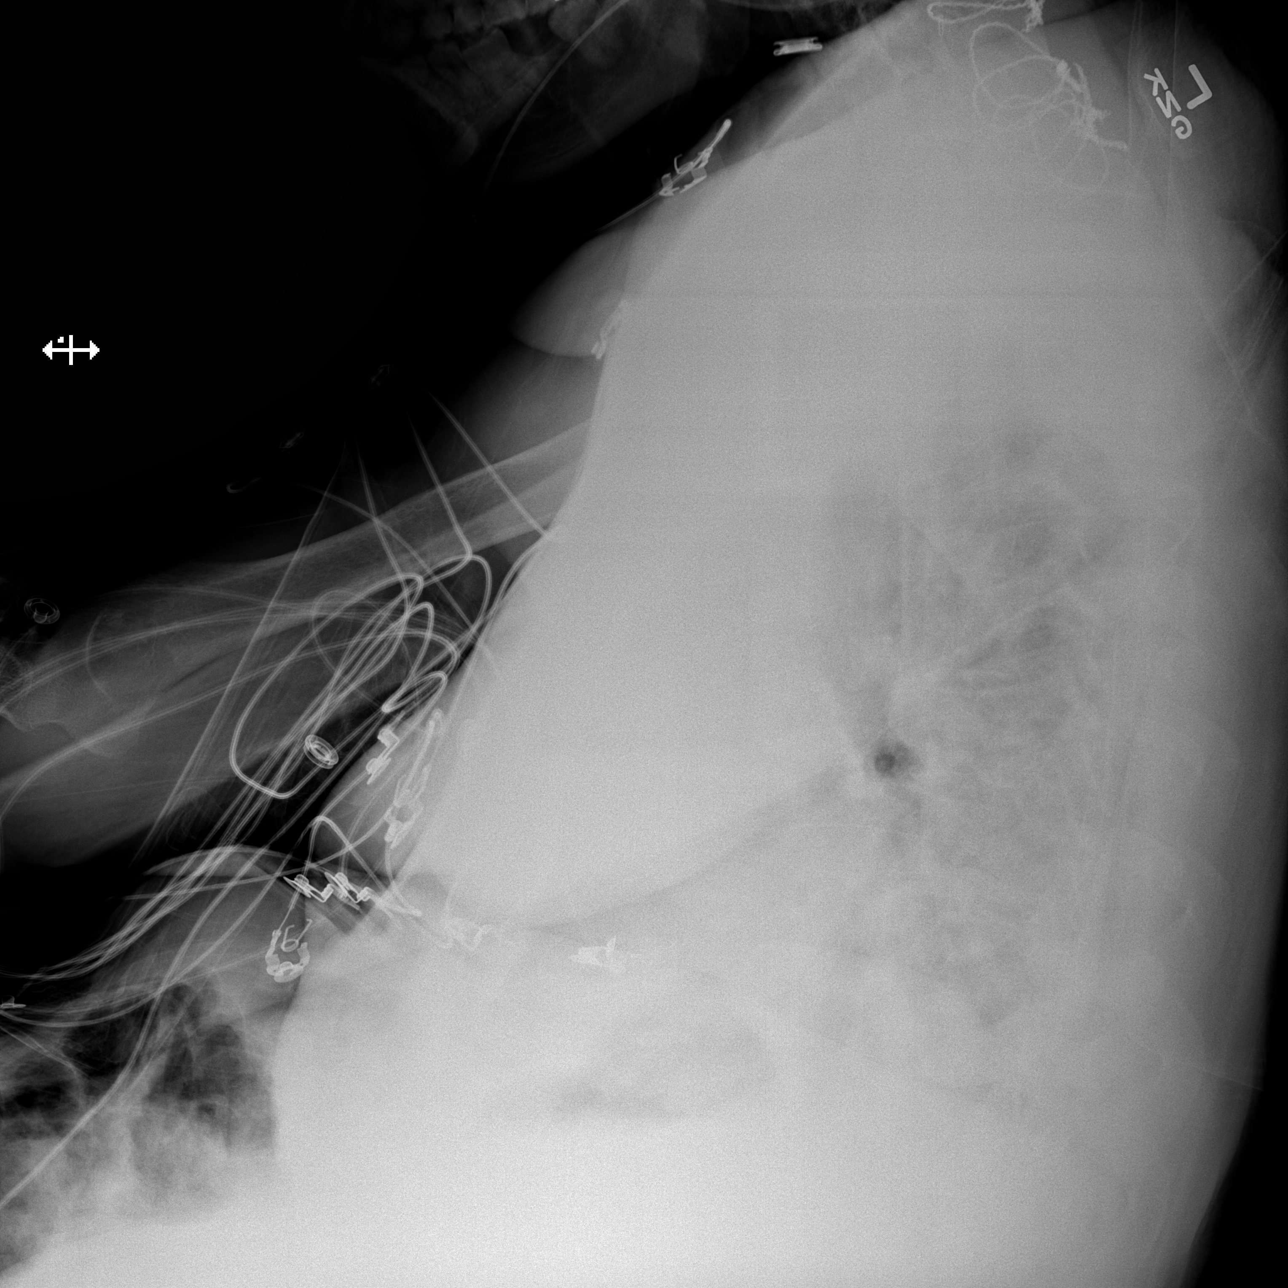

[x chest ap (1 of 2)]
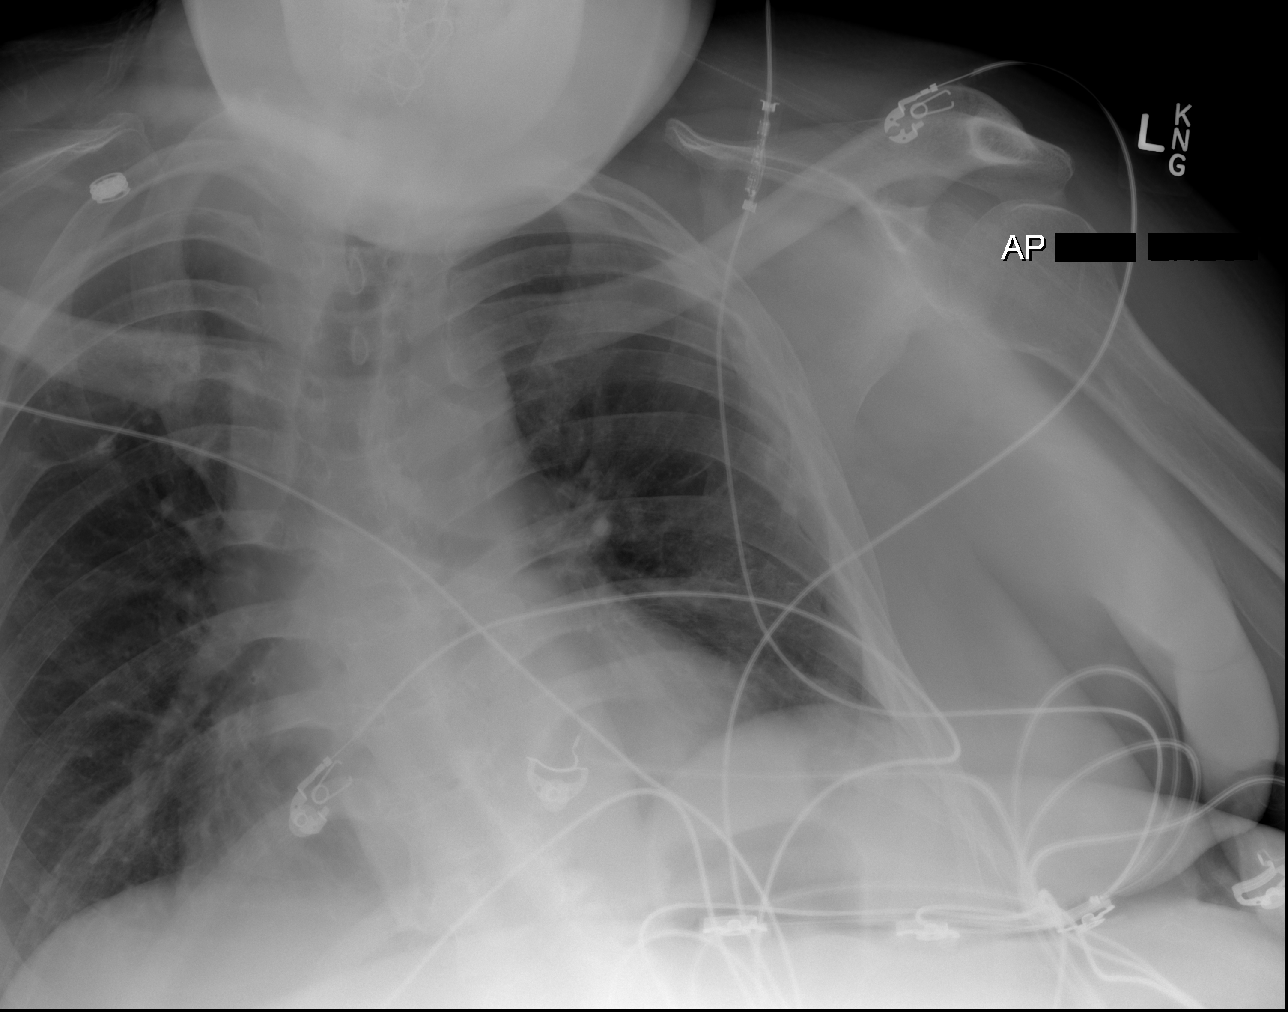

[x chest ap (2 of 2)]
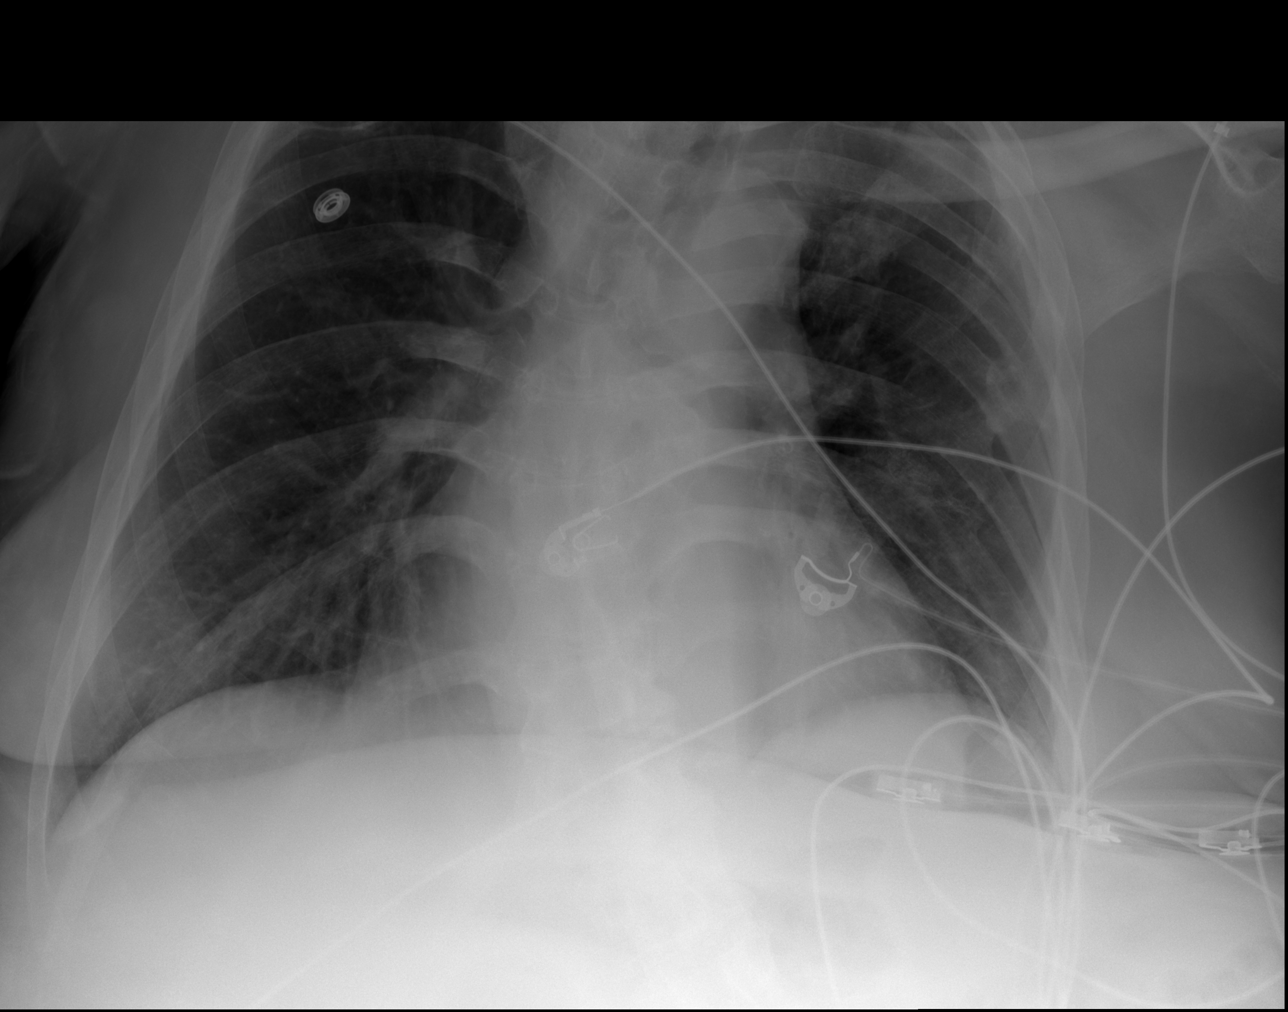

[3 of 3 positions shown; findings below may reference images not displayed]

FINDINGS: The lungs are clear. Heart size is upper normal. No pneumothorax or
pleural effusion. Scoliosis is noted.
IMPRESSION: No acute disease.

## 2018-01-10 IMAGING — CT CT ABD-PELV W/ CM
2 of 5 series · 15 of 46 positions shown, 17 images · IV contrast (OMNIPAQUE 300)
Comparison: CT Abdomen and Pelvis 03/16/2015 and earlier

CLINICAL DATA: 49-year-old quadriplegic male with large sacral
decubitus ulcer, right upper back ulcers. Recent sepsis secondary to
Pseudomonas urinary tract infection. Acute onset nausea today with
chills, diaphoresis. Increased drainage from his wounds. Initial
encounter.

EXAM:
CT ABDOMEN AND PELVIS WITH CONTRAST
TECHNIQUE: Multidetector CT imaging of the abdomen and pelvis was performed
using the standard protocol following bolus administration of
intravenous contrast.
CONTRAST:  100mL OMNIPAQUE IOHEXOL 300 MG/ML SOLN, 50mL OMNIPAQUE
IOHEXOL 300 MG/ML SOLN

[Series 3: abd/pel with · axial · 0.92mm/px · z∈[-796,-376]mm · 12 of 97 slices shown, 14 images]
[im 7/97  soft-tissue]
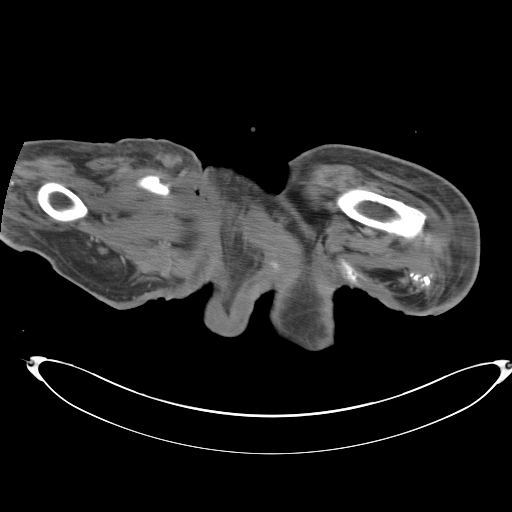
[im 7/97  bone]
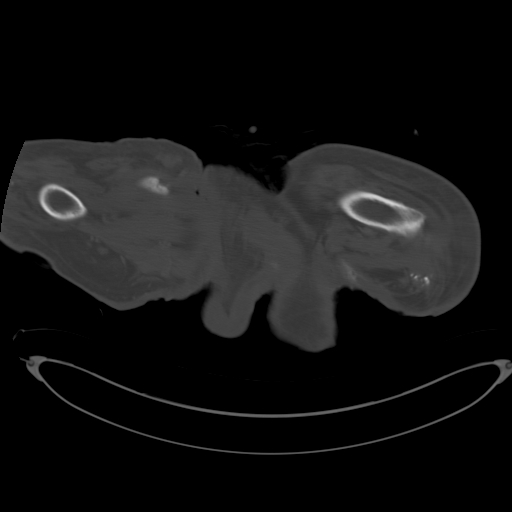
[im 13/97  soft-tissue]
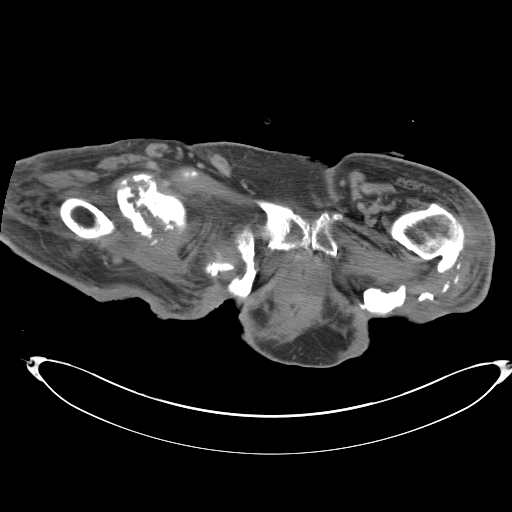
[im 25/97  soft-tissue]
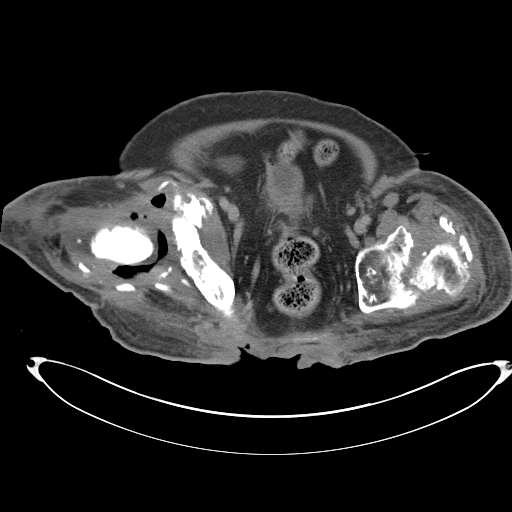
[im 31/97  soft-tissue]
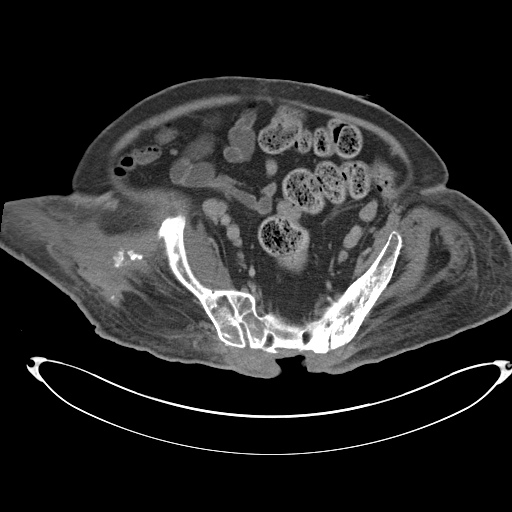
[im 37/97  soft-tissue]
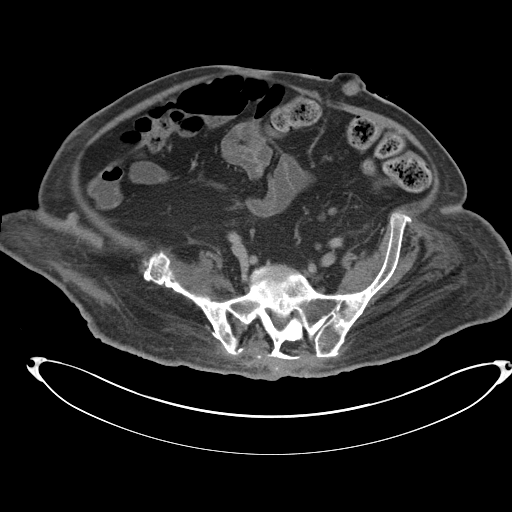
[im 43/97  soft-tissue]
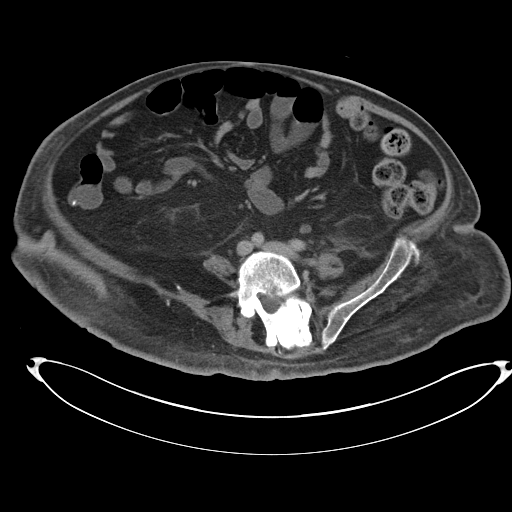
[im 55/97  soft-tissue]
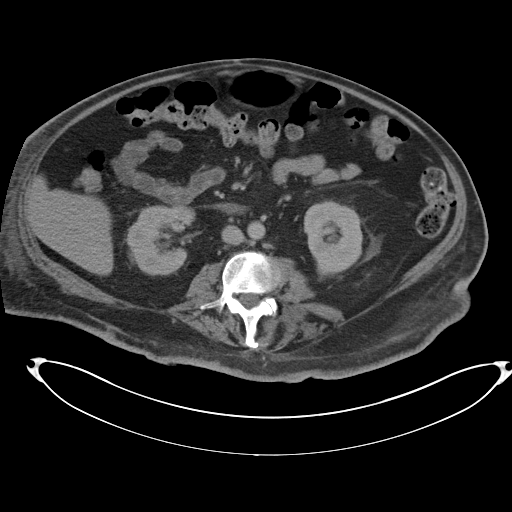
[im 61/97  soft-tissue]
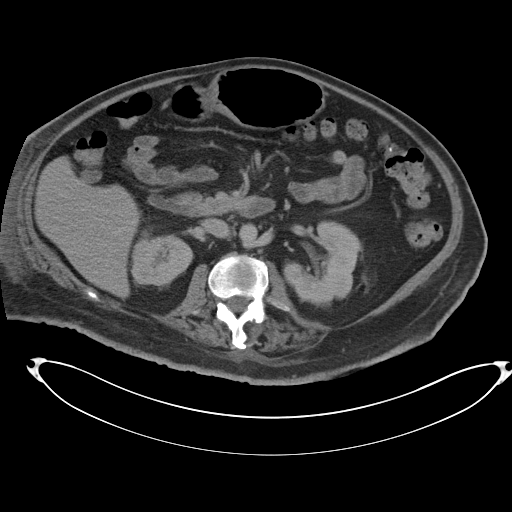
[im 67/97  soft-tissue]
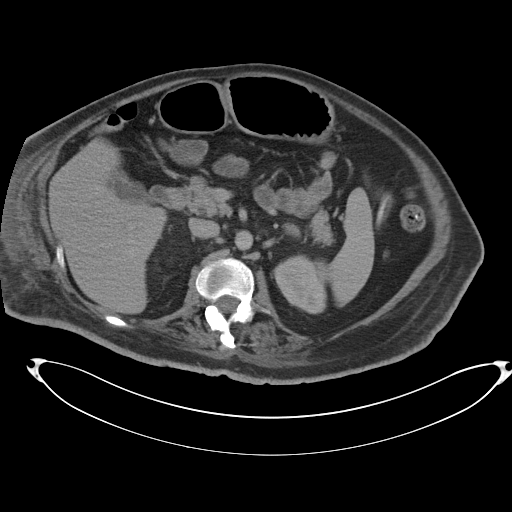
[im 67/97  bone]
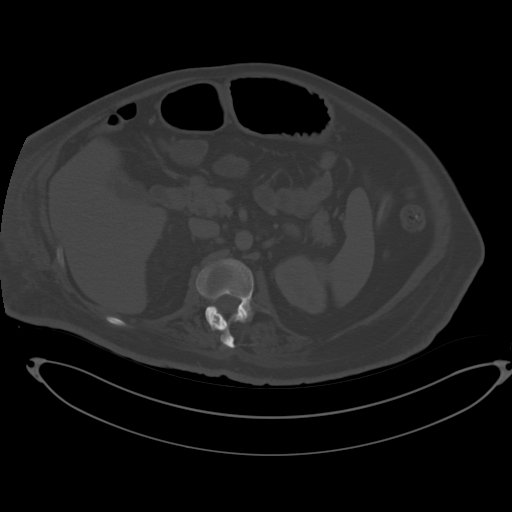
[im 73/97  soft-tissue]
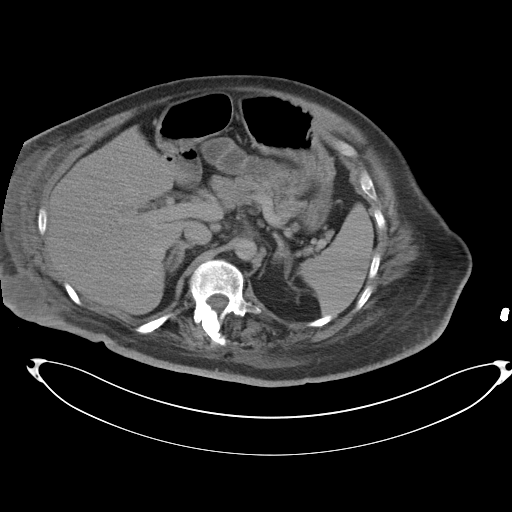
[im 85/97  soft-tissue]
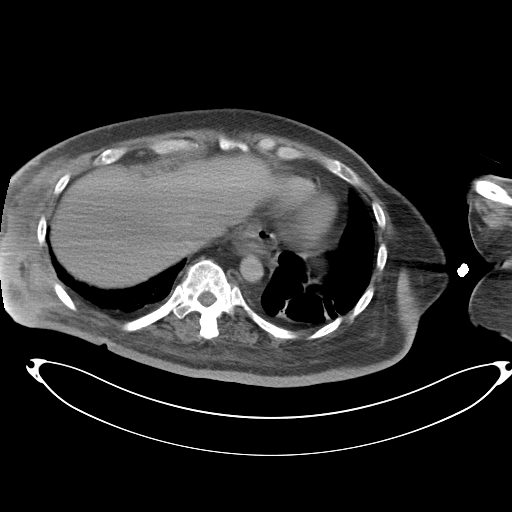
[im 91/97  soft-tissue]
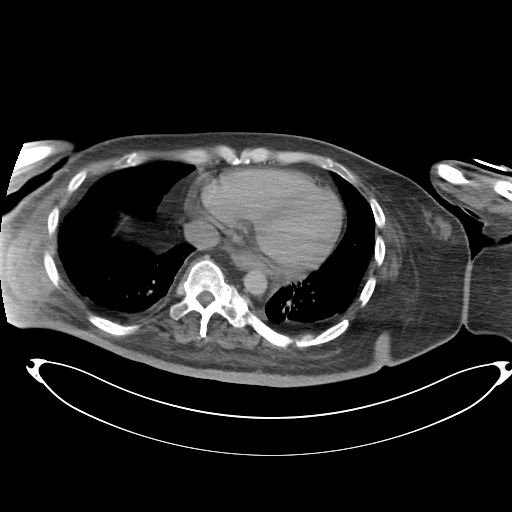

[Series 5: coronal a/|p · coronal · 0.94mm/px · 3 of 107 slices shown]
[im 36/107  soft-tissue]
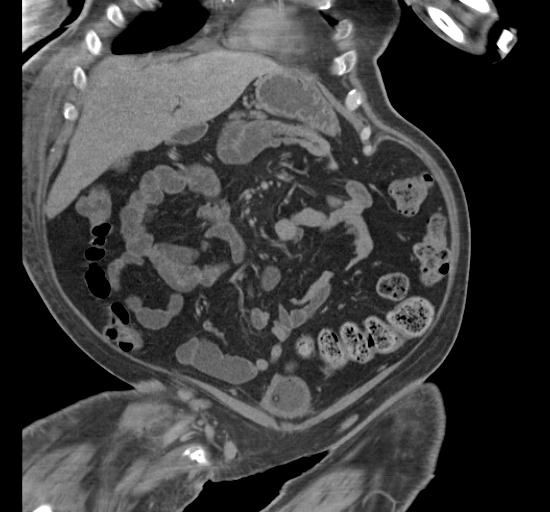
[im 48/107  soft-tissue]
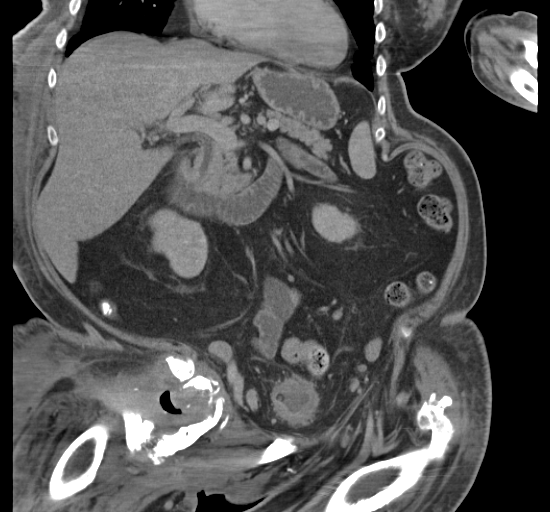
[im 59/107  soft-tissue]
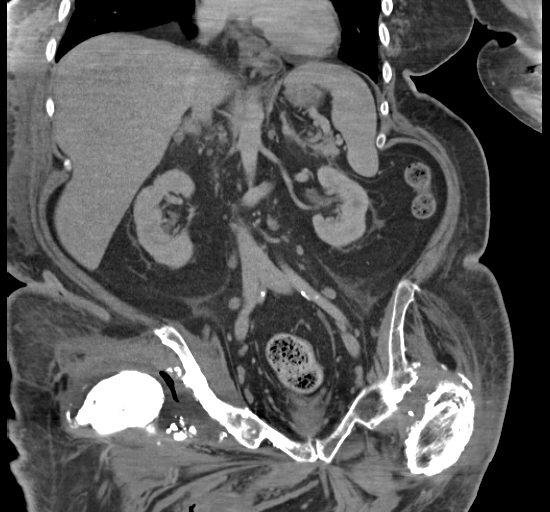

[15 of 46 positions shown; findings below may reference images not displayed]

FINDINGS: Stable lung bases with mild to moderate left greater than right
atelectasis. No pericardial or pleural effusion.

Subacute to chronic appearing posterior left rib fractures are more
apparent than in [REDACTED] (left ninth and tenth posterior ribs).
Stable visualized vertebral bodies.

New confluent right lateral and posterior chest wall subcutaneous
stranding and soft tissue thickening (series 3, image 12). No
underlying osseous changes of the adjacent right ribs there may be a
3.5 cm associated fluid collection communicating to the skin surface
on series 3, image 25.

Severe chronic osteomyelitis at both hips with pathologic fractures
appear stable since [REDACTED]. Posterior right hip wound which
communicates throughout the joint space re- demonstrated. Interval
decreased gas within the distal right psoas muscle. Granulation
tissue about both hips appears stable.

Chronic sacral decubitus wounds newly tracks to the posterior
inferior right SI joint, with subtle osteolysis since [DATE], image 71). Midline sacral wound otherwise appears stable.
Chronic posterior and inferior sacral osteomyelitis with sclerosis
otherwise is stable.

Suprapubic catheter, the urinary bladder is diminutive with a small
volume of gas. No pelvic free fluid. Chronic distal colostomy
colostomy is stable. Stable blind ending sigmoid and rectum with
retained stool. Retained stool in the transverse colon. Negative
right colon. No dilated small bowel. Small gastric hiatal hernia is
unchanged. Negative duodenum.

No abdominal free air or free fluid. Liver, gallbladder, spleen,
pancreas, and adrenal glands are within normal limits. Major
arterial structures appear patent. Renal enhancement and contrast
excretion within normal limits. No hydroureter small abdominal lymph
nodes are stable.
IMPRESSION: [DATE] new soft tissue wound/inflammation about the right
lateral and posterior chest wall with no underlying rib changes.
Possible 3.5 cm abscess communicating with the skin surface on
series 3, image 25.
2. Progressed sacral decubitus wound since [REDACTED], now
communicating with the posterior inferior right SI joint where
subtle changes of osteomyelitis are new (series 3, image 71).
3. Stable abdominal and pelvic viscera.
4. Severe chronic hip osteomyelitis is stable to mildly improved.
5. Subacute to chronic posttraumatic appearing left ninth and tenth
rib fractures were not apparent in [REDACTED].

## 2018-01-10 IMAGING — CR DG CHEST 1V PORT
1 series · 1 of 1 positions shown · non-contrast
Comparison: 05/28/2015.

CLINICAL DATA: Central line placement.

EXAM:
PORTABLE CHEST 1 VIEW

[AP]
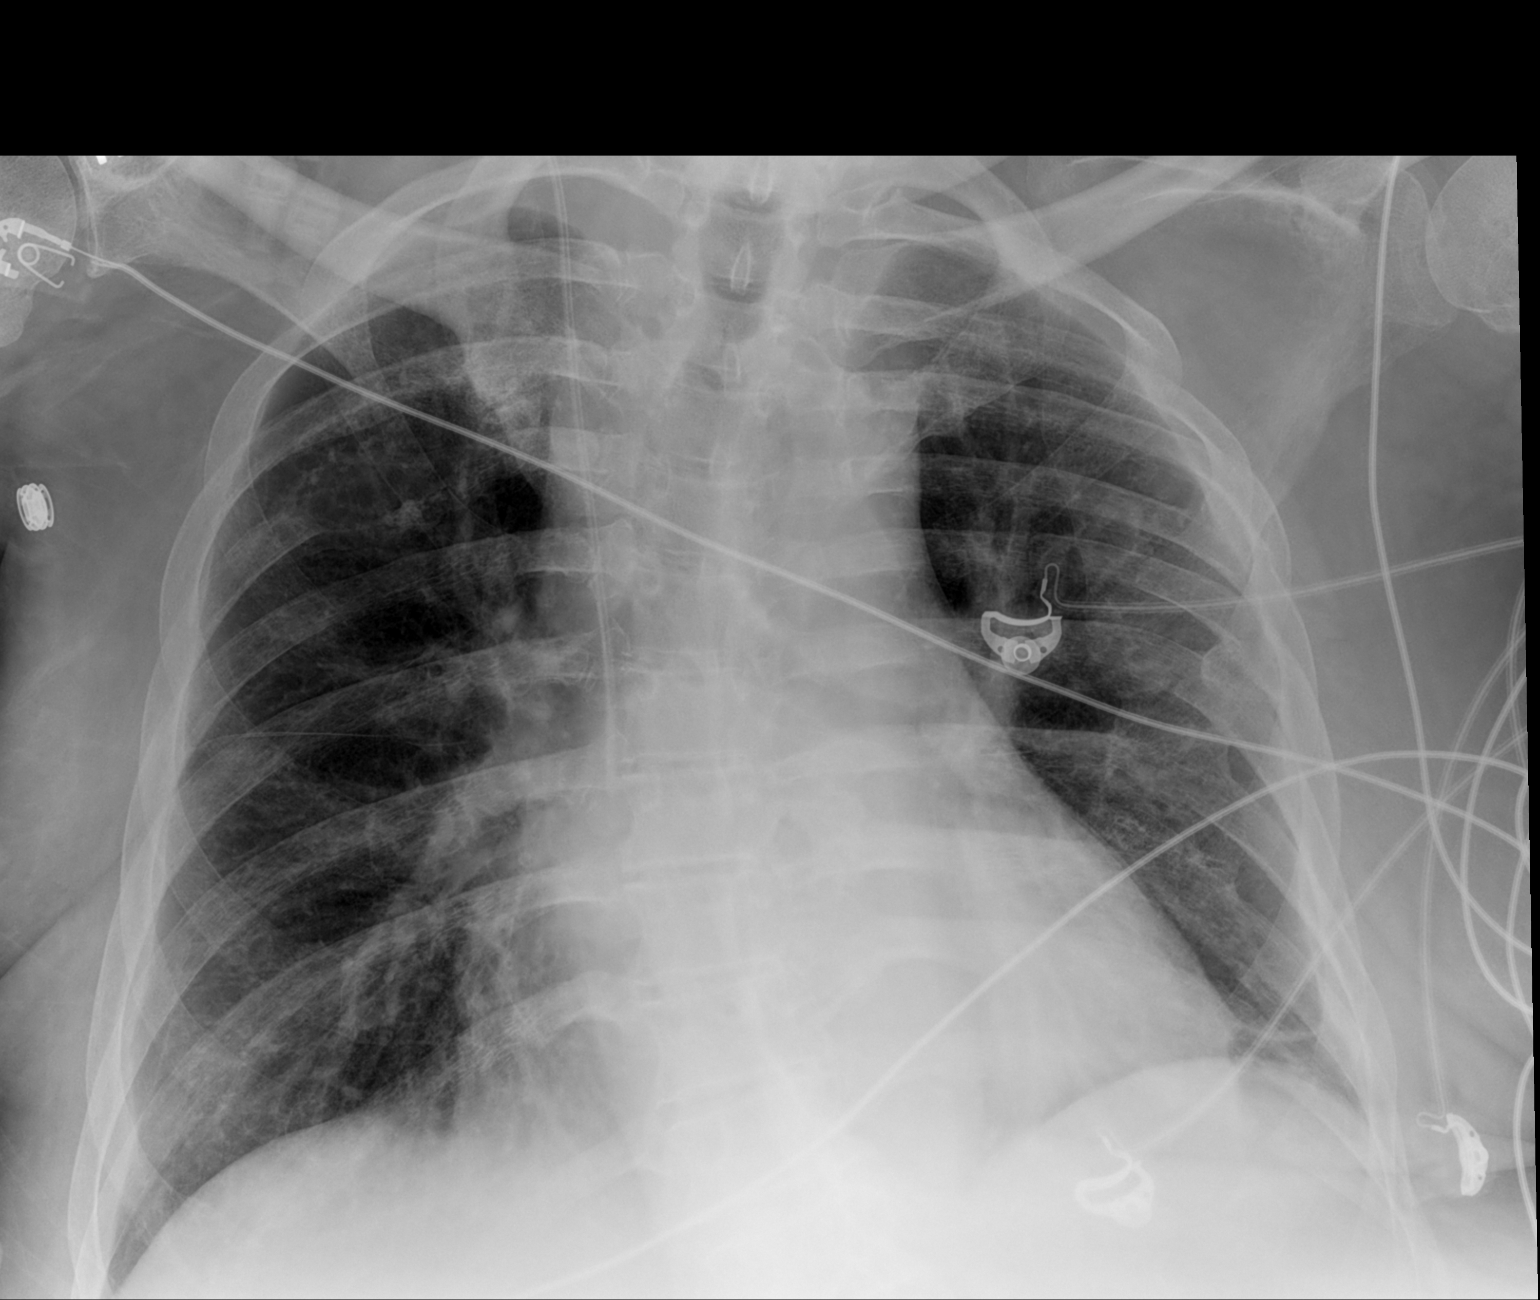

[1 of 1 positions shown; findings below may reference images not displayed]

FINDINGS: Right IJ line in stable position. Cardiomegaly. No pulmonary venous
congestion. Low lung volumes with mild basilar atelectasis. No
pleural effusion or pneumothorax. Old left posterior rib fractures.
IMPRESSION: 1. Right IJ line stable position.
2. Cardiomegaly.  No pulmonary venous congestion.
3. Low lung volumes with mild basilar atelectasis .

## 2018-01-15 IMAGING — XA IR US GUIDE VASC ACCESS LEFT
1 series · 3 of 3 positions shown · non-contrast
Comparison: none

CLINICAL DATA: Sepsis

[Series 300: line placements · 3 of 3 slices shown]
[im 1/3]
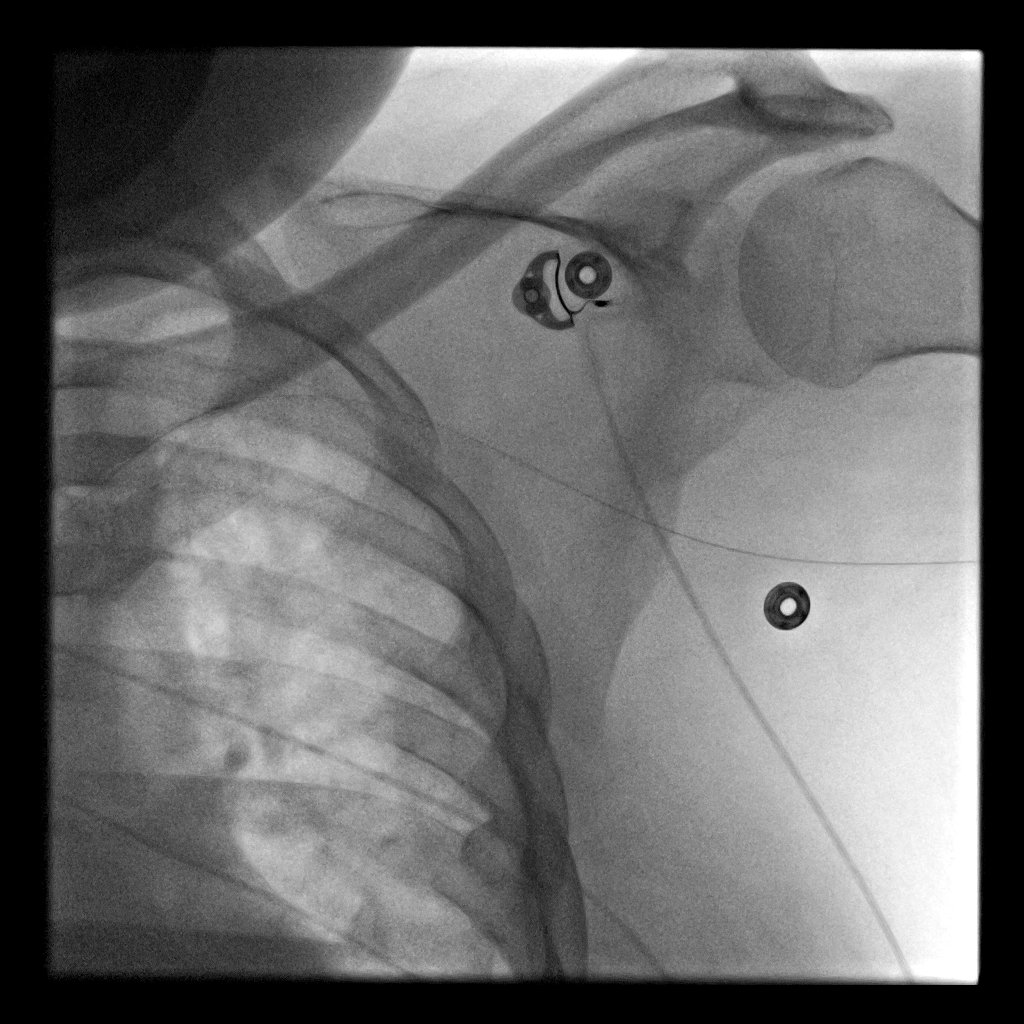
[im 2/3]
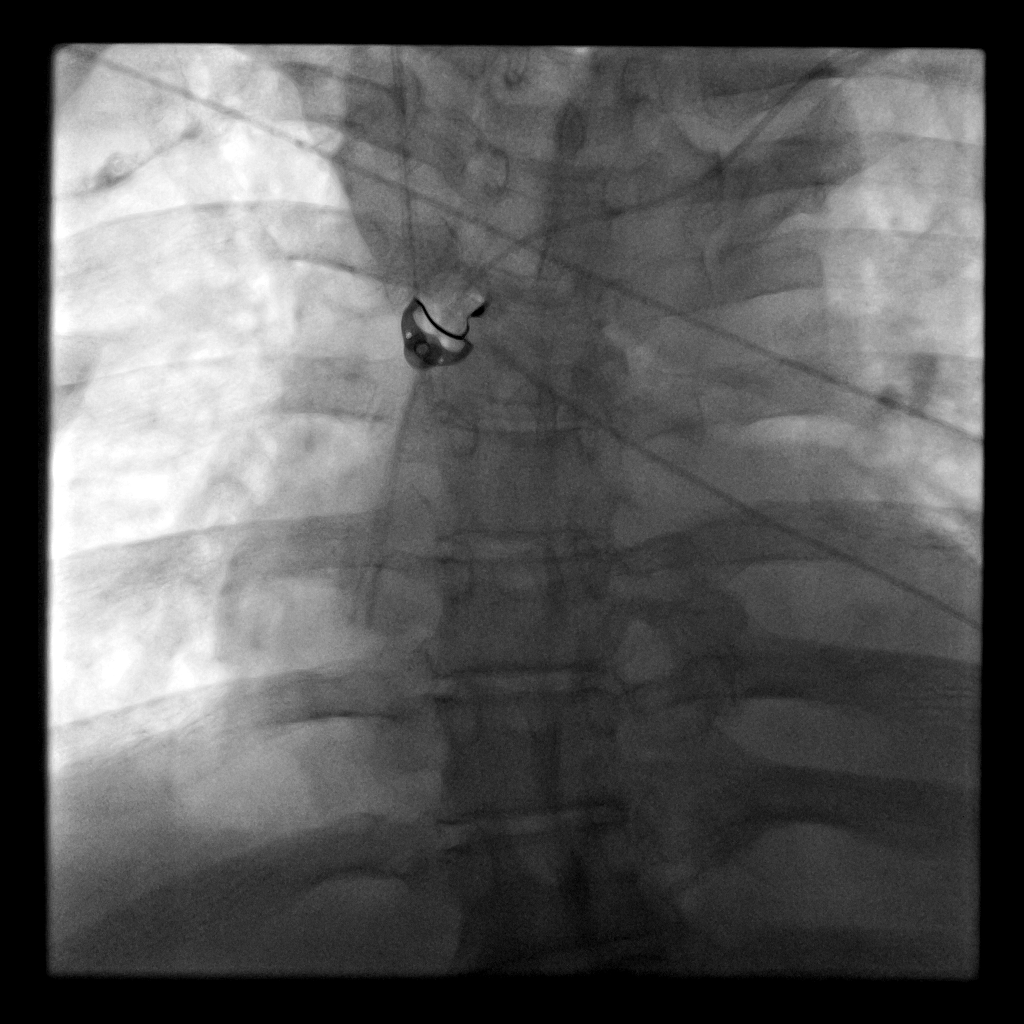
[im 3/3]
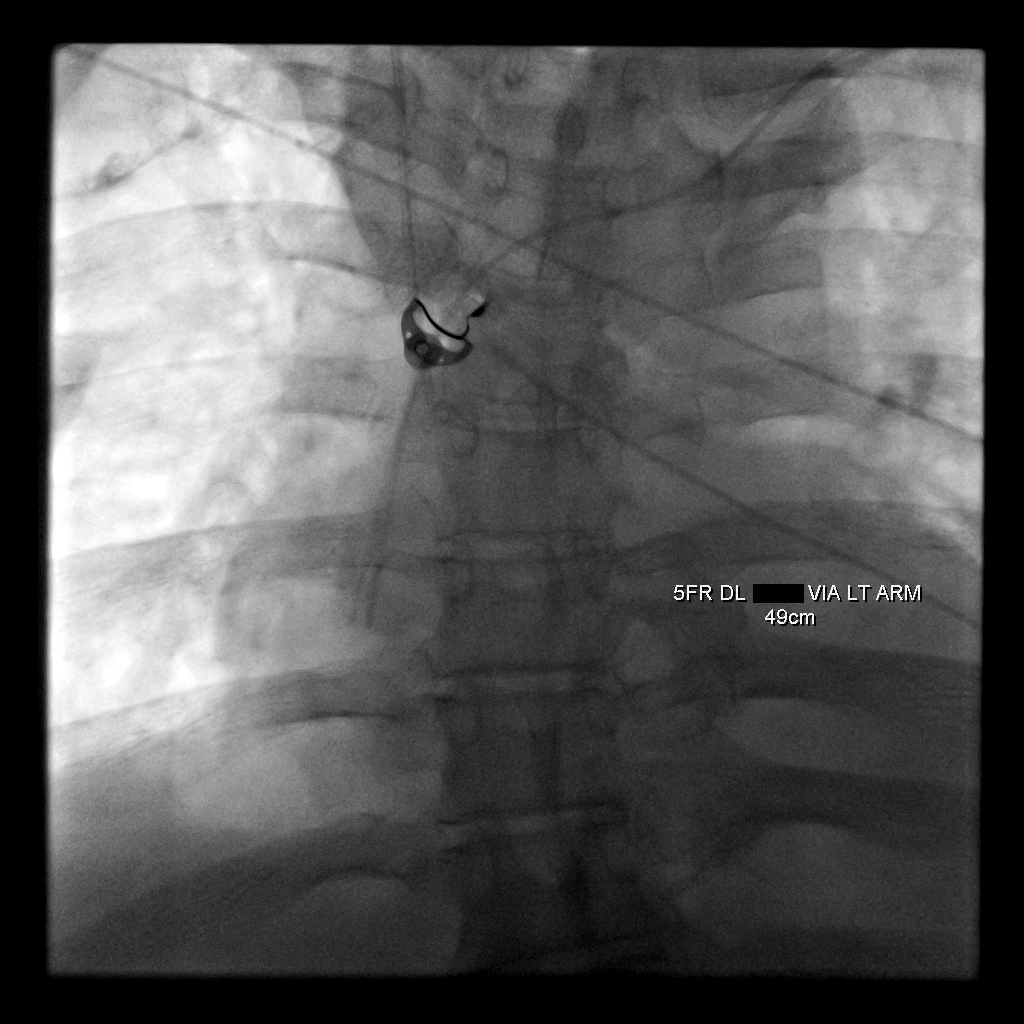

[3 of 3 positions shown; findings below may reference images not displayed]

EXAM:
POWER PICC LINE PLACEMENT WITH ULTRASOUND AND FLUOROSCOPIC GUIDANCE

FLUOROSCOPY TIME:  2.4 minutes, 12.1 mGy

PROCEDURE:
The patient was advised of the possible risks and complications and
agreed to undergo the procedure. The patient was then brought to the
angiographic suite for the procedure.

The left arm was prepped with chlorhexidine, draped in the usual
sterile fashion using maximum barrier technique (cap and mask,
sterile gown, sterile gloves, large sterile sheet, hand hygiene and
cutaneous antisepsis) and infiltrated locally with 1% Lidocaine.

Ultrasound demonstrated patency of the leftBrachial vein, and this
was documented with an image. Under real-time ultrasound guidance,
this vein was accessed with a 21 gauge micropuncture needle and
image documentation was performed. A [DATE] wire was introduced in to
the vein. Over this, a 5 French double lumen power PICC was advanced
to the lower SVC/right atrial junction. Fluoroscopy during the
procedure and fluoro spot radiograph confirms appropriate catheter
position. The catheter was flushed and covered with a sterile
dressing.

Catheter length: 40

Complications: None immediate
IMPRESSION: Successful left arm power PICC line placement with ultrasound and
fluoroscopic guidance. The catheter is ready for use.

## 2018-01-16 ENCOUNTER — Encounter (HOSPITAL_BASED_OUTPATIENT_CLINIC_OR_DEPARTMENT_OTHER): Payer: Medicare Other | Attending: Internal Medicine

## 2018-01-16 DIAGNOSIS — L89104 Pressure ulcer of unspecified part of back, stage 4: Secondary | ICD-10-CM | POA: Diagnosis not present

## 2018-01-16 DIAGNOSIS — G4730 Sleep apnea, unspecified: Secondary | ICD-10-CM | POA: Diagnosis not present

## 2018-01-16 DIAGNOSIS — L89323 Pressure ulcer of left buttock, stage 3: Secondary | ICD-10-CM | POA: Insufficient documentation

## 2018-01-16 DIAGNOSIS — L89213 Pressure ulcer of right hip, stage 3: Secondary | ICD-10-CM | POA: Insufficient documentation

## 2018-01-16 DIAGNOSIS — L89154 Pressure ulcer of sacral region, stage 4: Secondary | ICD-10-CM | POA: Insufficient documentation

## 2018-01-16 DIAGNOSIS — G8250 Quadriplegia, unspecified: Secondary | ICD-10-CM | POA: Insufficient documentation

## 2018-01-16 DIAGNOSIS — L89894 Pressure ulcer of other site, stage 4: Secondary | ICD-10-CM | POA: Diagnosis not present

## 2018-01-16 DIAGNOSIS — L89219 Pressure ulcer of right hip, unspecified stage: Secondary | ICD-10-CM | POA: Diagnosis not present

## 2018-01-16 DIAGNOSIS — I10 Essential (primary) hypertension: Secondary | ICD-10-CM | POA: Diagnosis not present

## 2018-01-16 DIAGNOSIS — L89222 Pressure ulcer of left hip, stage 2: Secondary | ICD-10-CM | POA: Diagnosis not present

## 2018-01-16 DIAGNOSIS — L89893 Pressure ulcer of other site, stage 3: Secondary | ICD-10-CM | POA: Insufficient documentation

## 2018-01-29 DIAGNOSIS — G4730 Sleep apnea, unspecified: Secondary | ICD-10-CM | POA: Diagnosis not present

## 2018-01-29 DIAGNOSIS — L89213 Pressure ulcer of right hip, stage 3: Secondary | ICD-10-CM | POA: Diagnosis not present

## 2018-01-29 DIAGNOSIS — G8250 Quadriplegia, unspecified: Secondary | ICD-10-CM | POA: Diagnosis not present

## 2018-01-29 DIAGNOSIS — L89104 Pressure ulcer of unspecified part of back, stage 4: Secondary | ICD-10-CM | POA: Diagnosis not present

## 2018-01-29 DIAGNOSIS — L89154 Pressure ulcer of sacral region, stage 4: Secondary | ICD-10-CM | POA: Diagnosis not present

## 2018-01-29 DIAGNOSIS — I10 Essential (primary) hypertension: Secondary | ICD-10-CM | POA: Diagnosis not present

## 2018-01-29 DIAGNOSIS — L89893 Pressure ulcer of other site, stage 3: Secondary | ICD-10-CM | POA: Diagnosis not present

## 2018-01-29 DIAGNOSIS — L89323 Pressure ulcer of left buttock, stage 3: Secondary | ICD-10-CM | POA: Diagnosis not present

## 2018-01-29 DIAGNOSIS — L89222 Pressure ulcer of left hip, stage 2: Secondary | ICD-10-CM | POA: Diagnosis not present

## 2018-02-02 DIAGNOSIS — Z23 Encounter for immunization: Secondary | ICD-10-CM | POA: Diagnosis not present

## 2018-02-02 DIAGNOSIS — Z09 Encounter for follow-up examination after completed treatment for conditions other than malignant neoplasm: Secondary | ICD-10-CM | POA: Diagnosis not present

## 2018-02-04 DIAGNOSIS — G8250 Quadriplegia, unspecified: Secondary | ICD-10-CM | POA: Diagnosis not present

## 2018-02-04 DIAGNOSIS — L89159 Pressure ulcer of sacral region, unspecified stage: Secondary | ICD-10-CM | POA: Diagnosis not present

## 2018-02-04 DIAGNOSIS — L89223 Pressure ulcer of left hip, stage 3: Secondary | ICD-10-CM | POA: Diagnosis not present

## 2018-02-04 DIAGNOSIS — R5381 Other malaise: Secondary | ICD-10-CM | POA: Diagnosis not present

## 2018-02-04 DIAGNOSIS — R279 Unspecified lack of coordination: Secondary | ICD-10-CM | POA: Diagnosis not present

## 2018-02-04 DIAGNOSIS — Z743 Need for continuous supervision: Secondary | ICD-10-CM | POA: Diagnosis not present

## 2018-02-04 DIAGNOSIS — L89154 Pressure ulcer of sacral region, stage 4: Secondary | ICD-10-CM | POA: Diagnosis not present

## 2018-02-04 DIAGNOSIS — T83518A Infection and inflammatory reaction due to other urinary catheter, initial encounter: Secondary | ICD-10-CM | POA: Diagnosis not present

## 2018-02-04 DIAGNOSIS — L89110 Pressure ulcer of right upper back, unstageable: Secondary | ICD-10-CM | POA: Diagnosis not present

## 2018-02-04 DIAGNOSIS — L89220 Pressure ulcer of left hip, unstageable: Secondary | ICD-10-CM | POA: Diagnosis not present

## 2018-02-04 DIAGNOSIS — N3000 Acute cystitis without hematuria: Secondary | ICD-10-CM | POA: Diagnosis not present

## 2018-02-04 DIAGNOSIS — M86651 Other chronic osteomyelitis, right thigh: Secondary | ICD-10-CM | POA: Diagnosis not present

## 2018-02-04 DIAGNOSIS — J45909 Unspecified asthma, uncomplicated: Secondary | ICD-10-CM | POA: Diagnosis not present

## 2018-02-04 DIAGNOSIS — L89899 Pressure ulcer of other site, unspecified stage: Secondary | ICD-10-CM | POA: Diagnosis not present

## 2018-02-04 DIAGNOSIS — T83511A Infection and inflammatory reaction due to indwelling urethral catheter, initial encounter: Secondary | ICD-10-CM | POA: Diagnosis not present

## 2018-02-04 DIAGNOSIS — G8220 Paraplegia, unspecified: Secondary | ICD-10-CM | POA: Diagnosis not present

## 2018-02-04 DIAGNOSIS — L89324 Pressure ulcer of left buttock, stage 4: Secondary | ICD-10-CM | POA: Diagnosis not present

## 2018-02-04 DIAGNOSIS — M8668 Other chronic osteomyelitis, other site: Secondary | ICD-10-CM | POA: Diagnosis not present

## 2018-02-04 DIAGNOSIS — L8945 Pressure ulcer of contiguous site of back, buttock and hip, unstageable: Secondary | ICD-10-CM | POA: Diagnosis not present

## 2018-02-04 DIAGNOSIS — M009 Pyogenic arthritis, unspecified: Secondary | ICD-10-CM | POA: Diagnosis not present

## 2018-02-04 DIAGNOSIS — L089 Local infection of the skin and subcutaneous tissue, unspecified: Secondary | ICD-10-CM | POA: Diagnosis not present

## 2018-02-04 DIAGNOSIS — R29898 Other symptoms and signs involving the musculoskeletal system: Secondary | ICD-10-CM | POA: Diagnosis not present

## 2018-02-04 DIAGNOSIS — R0902 Hypoxemia: Secondary | ICD-10-CM | POA: Diagnosis not present

## 2018-02-04 DIAGNOSIS — Z933 Colostomy status: Secondary | ICD-10-CM | POA: Diagnosis not present

## 2018-02-04 DIAGNOSIS — L89893 Pressure ulcer of other site, stage 3: Secondary | ICD-10-CM | POA: Diagnosis not present

## 2018-02-04 DIAGNOSIS — L89104 Pressure ulcer of unspecified part of back, stage 4: Secondary | ICD-10-CM | POA: Diagnosis not present

## 2018-02-04 DIAGNOSIS — M86652 Other chronic osteomyelitis, left thigh: Secondary | ICD-10-CM | POA: Diagnosis not present

## 2018-02-04 DIAGNOSIS — K210 Gastro-esophageal reflux disease with esophagitis: Secondary | ICD-10-CM | POA: Diagnosis not present

## 2018-02-04 DIAGNOSIS — L89613 Pressure ulcer of right heel, stage 3: Secondary | ICD-10-CM | POA: Diagnosis not present

## 2018-02-04 DIAGNOSIS — D649 Anemia, unspecified: Secondary | ICD-10-CM | POA: Diagnosis not present

## 2018-02-04 DIAGNOSIS — L89214 Pressure ulcer of right hip, stage 4: Secondary | ICD-10-CM | POA: Diagnosis not present

## 2018-02-04 DIAGNOSIS — L97513 Non-pressure chronic ulcer of other part of right foot with necrosis of muscle: Secondary | ICD-10-CM | POA: Diagnosis not present

## 2018-02-04 DIAGNOSIS — I10 Essential (primary) hypertension: Secondary | ICD-10-CM | POA: Diagnosis not present

## 2018-02-04 DIAGNOSIS — L89219 Pressure ulcer of right hip, unspecified stage: Secondary | ICD-10-CM | POA: Diagnosis not present

## 2018-02-04 DIAGNOSIS — E876 Hypokalemia: Secondary | ICD-10-CM | POA: Diagnosis not present

## 2018-02-04 DIAGNOSIS — Z9359 Other cystostomy status: Secondary | ICD-10-CM | POA: Diagnosis not present

## 2018-02-04 DIAGNOSIS — L89890 Pressure ulcer of other site, unstageable: Secondary | ICD-10-CM | POA: Diagnosis not present

## 2018-02-04 DIAGNOSIS — L89229 Pressure ulcer of left hip, unspecified stage: Secondary | ICD-10-CM | POA: Diagnosis not present

## 2018-02-04 DIAGNOSIS — M4624 Osteomyelitis of vertebra, thoracic region: Secondary | ICD-10-CM | POA: Diagnosis not present

## 2018-02-04 DIAGNOSIS — R509 Fever, unspecified: Secondary | ICD-10-CM | POA: Diagnosis not present

## 2018-02-04 DIAGNOSIS — R109 Unspecified abdominal pain: Secondary | ICD-10-CM | POA: Diagnosis not present

## 2018-02-04 DIAGNOSIS — Z87440 Personal history of urinary (tract) infections: Secondary | ICD-10-CM | POA: Diagnosis not present

## 2018-02-04 DIAGNOSIS — T148XXA Other injury of unspecified body region, initial encounter: Secondary | ICD-10-CM | POA: Diagnosis not present

## 2018-02-05 DIAGNOSIS — L89159 Pressure ulcer of sacral region, unspecified stage: Secondary | ICD-10-CM | POA: Diagnosis not present

## 2018-02-05 DIAGNOSIS — L89899 Pressure ulcer of other site, unspecified stage: Secondary | ICD-10-CM | POA: Diagnosis not present

## 2018-02-05 DIAGNOSIS — L89219 Pressure ulcer of right hip, unspecified stage: Secondary | ICD-10-CM | POA: Diagnosis not present

## 2018-02-05 DIAGNOSIS — L89229 Pressure ulcer of left hip, unspecified stage: Secondary | ICD-10-CM | POA: Diagnosis not present

## 2018-02-06 DIAGNOSIS — L89220 Pressure ulcer of left hip, unstageable: Secondary | ICD-10-CM | POA: Diagnosis not present

## 2018-02-06 DIAGNOSIS — L8945 Pressure ulcer of contiguous site of back, buttock and hip, unstageable: Secondary | ICD-10-CM | POA: Diagnosis not present

## 2018-02-06 DIAGNOSIS — L89154 Pressure ulcer of sacral region, stage 4: Secondary | ICD-10-CM | POA: Diagnosis not present

## 2018-02-06 DIAGNOSIS — M86652 Other chronic osteomyelitis, left thigh: Secondary | ICD-10-CM | POA: Diagnosis not present

## 2018-02-06 DIAGNOSIS — T148XXA Other injury of unspecified body region, initial encounter: Secondary | ICD-10-CM | POA: Diagnosis not present

## 2018-02-06 DIAGNOSIS — L089 Local infection of the skin and subcutaneous tissue, unspecified: Secondary | ICD-10-CM | POA: Diagnosis not present

## 2018-02-07 DIAGNOSIS — M009 Pyogenic arthritis, unspecified: Secondary | ICD-10-CM | POA: Diagnosis not present

## 2018-02-07 DIAGNOSIS — L089 Local infection of the skin and subcutaneous tissue, unspecified: Secondary | ICD-10-CM | POA: Diagnosis not present

## 2018-02-07 DIAGNOSIS — L89890 Pressure ulcer of other site, unstageable: Secondary | ICD-10-CM | POA: Diagnosis not present

## 2018-02-07 DIAGNOSIS — M86652 Other chronic osteomyelitis, left thigh: Secondary | ICD-10-CM | POA: Diagnosis not present

## 2018-02-07 DIAGNOSIS — L89154 Pressure ulcer of sacral region, stage 4: Secondary | ICD-10-CM | POA: Diagnosis not present

## 2018-02-07 DIAGNOSIS — L89220 Pressure ulcer of left hip, unstageable: Secondary | ICD-10-CM | POA: Diagnosis not present

## 2018-02-07 DIAGNOSIS — L8945 Pressure ulcer of contiguous site of back, buttock and hip, unstageable: Secondary | ICD-10-CM | POA: Diagnosis not present

## 2018-02-08 DIAGNOSIS — T148XXA Other injury of unspecified body region, initial encounter: Secondary | ICD-10-CM | POA: Diagnosis not present

## 2018-02-08 DIAGNOSIS — L89220 Pressure ulcer of left hip, unstageable: Secondary | ICD-10-CM | POA: Diagnosis not present

## 2018-02-08 DIAGNOSIS — L089 Local infection of the skin and subcutaneous tissue, unspecified: Secondary | ICD-10-CM | POA: Diagnosis not present

## 2018-02-08 DIAGNOSIS — L89154 Pressure ulcer of sacral region, stage 4: Secondary | ICD-10-CM | POA: Diagnosis not present

## 2018-02-08 DIAGNOSIS — M009 Pyogenic arthritis, unspecified: Secondary | ICD-10-CM | POA: Diagnosis not present

## 2018-02-08 DIAGNOSIS — R109 Unspecified abdominal pain: Secondary | ICD-10-CM | POA: Diagnosis not present

## 2018-02-08 DIAGNOSIS — L89229 Pressure ulcer of left hip, unspecified stage: Secondary | ICD-10-CM | POA: Diagnosis not present

## 2018-02-08 DIAGNOSIS — L89890 Pressure ulcer of other site, unstageable: Secondary | ICD-10-CM | POA: Diagnosis not present

## 2018-02-08 DIAGNOSIS — L89219 Pressure ulcer of right hip, unspecified stage: Secondary | ICD-10-CM | POA: Diagnosis not present

## 2018-02-08 DIAGNOSIS — L89899 Pressure ulcer of other site, unspecified stage: Secondary | ICD-10-CM | POA: Diagnosis not present

## 2018-02-09 DIAGNOSIS — M009 Pyogenic arthritis, unspecified: Secondary | ICD-10-CM | POA: Diagnosis not present

## 2018-02-09 DIAGNOSIS — L89220 Pressure ulcer of left hip, unstageable: Secondary | ICD-10-CM | POA: Diagnosis not present

## 2018-02-09 DIAGNOSIS — L089 Local infection of the skin and subcutaneous tissue, unspecified: Secondary | ICD-10-CM | POA: Diagnosis not present

## 2018-02-09 DIAGNOSIS — L89899 Pressure ulcer of other site, unspecified stage: Secondary | ICD-10-CM | POA: Diagnosis not present

## 2018-02-09 DIAGNOSIS — L89219 Pressure ulcer of right hip, unspecified stage: Secondary | ICD-10-CM | POA: Diagnosis not present

## 2018-02-09 DIAGNOSIS — L89229 Pressure ulcer of left hip, unspecified stage: Secondary | ICD-10-CM | POA: Diagnosis not present

## 2018-02-09 DIAGNOSIS — L89890 Pressure ulcer of other site, unstageable: Secondary | ICD-10-CM | POA: Diagnosis not present

## 2018-02-09 DIAGNOSIS — G8250 Quadriplegia, unspecified: Secondary | ICD-10-CM | POA: Diagnosis not present

## 2018-02-09 DIAGNOSIS — L89154 Pressure ulcer of sacral region, stage 4: Secondary | ICD-10-CM | POA: Diagnosis not present

## 2018-02-09 DIAGNOSIS — L89223 Pressure ulcer of left hip, stage 3: Secondary | ICD-10-CM | POA: Diagnosis not present

## 2018-02-10 DIAGNOSIS — T148XXA Other injury of unspecified body region, initial encounter: Secondary | ICD-10-CM | POA: Diagnosis not present

## 2018-02-10 DIAGNOSIS — L89899 Pressure ulcer of other site, unspecified stage: Secondary | ICD-10-CM | POA: Diagnosis not present

## 2018-02-10 DIAGNOSIS — M86652 Other chronic osteomyelitis, left thigh: Secondary | ICD-10-CM | POA: Diagnosis not present

## 2018-02-10 DIAGNOSIS — L8945 Pressure ulcer of contiguous site of back, buttock and hip, unstageable: Secondary | ICD-10-CM | POA: Diagnosis not present

## 2018-02-10 DIAGNOSIS — L089 Local infection of the skin and subcutaneous tissue, unspecified: Secondary | ICD-10-CM | POA: Diagnosis not present

## 2018-02-10 DIAGNOSIS — L89154 Pressure ulcer of sacral region, stage 4: Secondary | ICD-10-CM | POA: Diagnosis not present

## 2018-02-10 DIAGNOSIS — L89229 Pressure ulcer of left hip, unspecified stage: Secondary | ICD-10-CM | POA: Diagnosis not present

## 2018-02-10 DIAGNOSIS — L89219 Pressure ulcer of right hip, unspecified stage: Secondary | ICD-10-CM | POA: Diagnosis not present

## 2018-02-11 DIAGNOSIS — M86652 Other chronic osteomyelitis, left thigh: Secondary | ICD-10-CM | POA: Diagnosis not present

## 2018-02-11 DIAGNOSIS — L089 Local infection of the skin and subcutaneous tissue, unspecified: Secondary | ICD-10-CM | POA: Diagnosis not present

## 2018-02-11 DIAGNOSIS — T83511A Infection and inflammatory reaction due to indwelling urethral catheter, initial encounter: Secondary | ICD-10-CM | POA: Diagnosis not present

## 2018-02-11 DIAGNOSIS — M8668 Other chronic osteomyelitis, other site: Secondary | ICD-10-CM | POA: Diagnosis not present

## 2018-02-12 DIAGNOSIS — T83511A Infection and inflammatory reaction due to indwelling urethral catheter, initial encounter: Secondary | ICD-10-CM | POA: Diagnosis not present

## 2018-02-12 DIAGNOSIS — L89613 Pressure ulcer of right heel, stage 3: Secondary | ICD-10-CM | POA: Diagnosis not present

## 2018-02-12 DIAGNOSIS — L89104 Pressure ulcer of unspecified part of back, stage 4: Secondary | ICD-10-CM | POA: Diagnosis not present

## 2018-02-12 DIAGNOSIS — L89110 Pressure ulcer of right upper back, unstageable: Secondary | ICD-10-CM | POA: Diagnosis not present

## 2018-02-12 DIAGNOSIS — T148XXA Other injury of unspecified body region, initial encounter: Secondary | ICD-10-CM | POA: Diagnosis not present

## 2018-02-12 DIAGNOSIS — M8668 Other chronic osteomyelitis, other site: Secondary | ICD-10-CM | POA: Diagnosis not present

## 2018-02-12 DIAGNOSIS — L89154 Pressure ulcer of sacral region, stage 4: Secondary | ICD-10-CM | POA: Diagnosis not present

## 2018-02-12 DIAGNOSIS — L97513 Non-pressure chronic ulcer of other part of right foot with necrosis of muscle: Secondary | ICD-10-CM | POA: Diagnosis not present

## 2018-02-12 DIAGNOSIS — M86652 Other chronic osteomyelitis, left thigh: Secondary | ICD-10-CM | POA: Diagnosis not present

## 2018-02-12 DIAGNOSIS — L089 Local infection of the skin and subcutaneous tissue, unspecified: Secondary | ICD-10-CM | POA: Diagnosis not present

## 2018-02-13 DIAGNOSIS — M86652 Other chronic osteomyelitis, left thigh: Secondary | ICD-10-CM | POA: Diagnosis not present

## 2018-02-13 DIAGNOSIS — L89159 Pressure ulcer of sacral region, unspecified stage: Secondary | ICD-10-CM | POA: Diagnosis not present

## 2018-02-13 DIAGNOSIS — L89110 Pressure ulcer of right upper back, unstageable: Secondary | ICD-10-CM | POA: Diagnosis not present

## 2018-02-13 DIAGNOSIS — L89219 Pressure ulcer of right hip, unspecified stage: Secondary | ICD-10-CM | POA: Diagnosis not present

## 2018-02-13 DIAGNOSIS — L89154 Pressure ulcer of sacral region, stage 4: Secondary | ICD-10-CM | POA: Diagnosis not present

## 2018-02-13 DIAGNOSIS — L89899 Pressure ulcer of other site, unspecified stage: Secondary | ICD-10-CM | POA: Diagnosis not present

## 2018-02-13 DIAGNOSIS — T148XXA Other injury of unspecified body region, initial encounter: Secondary | ICD-10-CM | POA: Diagnosis not present

## 2018-02-13 DIAGNOSIS — M86651 Other chronic osteomyelitis, right thigh: Secondary | ICD-10-CM | POA: Diagnosis not present

## 2018-02-14 DIAGNOSIS — L89159 Pressure ulcer of sacral region, unspecified stage: Secondary | ICD-10-CM | POA: Diagnosis not present

## 2018-02-14 DIAGNOSIS — L89219 Pressure ulcer of right hip, unspecified stage: Secondary | ICD-10-CM | POA: Diagnosis not present

## 2018-02-14 DIAGNOSIS — L8945 Pressure ulcer of contiguous site of back, buttock and hip, unstageable: Secondary | ICD-10-CM | POA: Diagnosis not present

## 2018-02-14 DIAGNOSIS — M86652 Other chronic osteomyelitis, left thigh: Secondary | ICD-10-CM | POA: Diagnosis not present

## 2018-02-14 DIAGNOSIS — L089 Local infection of the skin and subcutaneous tissue, unspecified: Secondary | ICD-10-CM | POA: Diagnosis not present

## 2018-02-14 DIAGNOSIS — L89899 Pressure ulcer of other site, unspecified stage: Secondary | ICD-10-CM | POA: Diagnosis not present

## 2018-02-14 DIAGNOSIS — M86651 Other chronic osteomyelitis, right thigh: Secondary | ICD-10-CM | POA: Diagnosis not present

## 2018-02-14 DIAGNOSIS — T148XXA Other injury of unspecified body region, initial encounter: Secondary | ICD-10-CM | POA: Diagnosis not present

## 2018-02-15 DIAGNOSIS — L89219 Pressure ulcer of right hip, unspecified stage: Secondary | ICD-10-CM | POA: Diagnosis not present

## 2018-02-15 DIAGNOSIS — M86651 Other chronic osteomyelitis, right thigh: Secondary | ICD-10-CM | POA: Diagnosis not present

## 2018-02-15 DIAGNOSIS — L89154 Pressure ulcer of sacral region, stage 4: Secondary | ICD-10-CM | POA: Diagnosis not present

## 2018-02-15 DIAGNOSIS — T148XXA Other injury of unspecified body region, initial encounter: Secondary | ICD-10-CM | POA: Diagnosis not present

## 2018-02-15 DIAGNOSIS — M86652 Other chronic osteomyelitis, left thigh: Secondary | ICD-10-CM | POA: Diagnosis not present

## 2018-02-15 DIAGNOSIS — L89110 Pressure ulcer of right upper back, unstageable: Secondary | ICD-10-CM | POA: Diagnosis not present

## 2018-02-15 DIAGNOSIS — L89159 Pressure ulcer of sacral region, unspecified stage: Secondary | ICD-10-CM | POA: Diagnosis not present

## 2018-02-15 DIAGNOSIS — L89899 Pressure ulcer of other site, unspecified stage: Secondary | ICD-10-CM | POA: Diagnosis not present

## 2018-02-16 DIAGNOSIS — M86651 Other chronic osteomyelitis, right thigh: Secondary | ICD-10-CM | POA: Diagnosis not present

## 2018-02-16 DIAGNOSIS — L89899 Pressure ulcer of other site, unspecified stage: Secondary | ICD-10-CM | POA: Diagnosis not present

## 2018-02-16 DIAGNOSIS — L89159 Pressure ulcer of sacral region, unspecified stage: Secondary | ICD-10-CM | POA: Diagnosis not present

## 2018-02-16 DIAGNOSIS — T148XXA Other injury of unspecified body region, initial encounter: Secondary | ICD-10-CM | POA: Diagnosis not present

## 2018-02-16 DIAGNOSIS — L89110 Pressure ulcer of right upper back, unstageable: Secondary | ICD-10-CM | POA: Diagnosis not present

## 2018-02-16 DIAGNOSIS — M86652 Other chronic osteomyelitis, left thigh: Secondary | ICD-10-CM | POA: Diagnosis not present

## 2018-02-16 DIAGNOSIS — L89154 Pressure ulcer of sacral region, stage 4: Secondary | ICD-10-CM | POA: Diagnosis not present

## 2018-02-16 DIAGNOSIS — L89219 Pressure ulcer of right hip, unspecified stage: Secondary | ICD-10-CM | POA: Diagnosis not present

## 2018-02-17 DIAGNOSIS — L89219 Pressure ulcer of right hip, unspecified stage: Secondary | ICD-10-CM | POA: Diagnosis not present

## 2018-02-17 DIAGNOSIS — T148XXA Other injury of unspecified body region, initial encounter: Secondary | ICD-10-CM | POA: Diagnosis not present

## 2018-02-17 DIAGNOSIS — Z743 Need for continuous supervision: Secondary | ICD-10-CM | POA: Diagnosis not present

## 2018-02-17 DIAGNOSIS — M86651 Other chronic osteomyelitis, right thigh: Secondary | ICD-10-CM | POA: Diagnosis not present

## 2018-02-17 DIAGNOSIS — L89159 Pressure ulcer of sacral region, unspecified stage: Secondary | ICD-10-CM | POA: Diagnosis not present

## 2018-02-17 DIAGNOSIS — R29898 Other symptoms and signs involving the musculoskeletal system: Secondary | ICD-10-CM | POA: Diagnosis not present

## 2018-02-17 DIAGNOSIS — L89899 Pressure ulcer of other site, unspecified stage: Secondary | ICD-10-CM | POA: Diagnosis not present

## 2018-02-17 DIAGNOSIS — R279 Unspecified lack of coordination: Secondary | ICD-10-CM | POA: Diagnosis not present

## 2018-02-17 DIAGNOSIS — M86652 Other chronic osteomyelitis, left thigh: Secondary | ICD-10-CM | POA: Diagnosis not present

## 2018-02-17 DIAGNOSIS — L89110 Pressure ulcer of right upper back, unstageable: Secondary | ICD-10-CM | POA: Diagnosis not present

## 2018-02-17 DIAGNOSIS — L89154 Pressure ulcer of sacral region, stage 4: Secondary | ICD-10-CM | POA: Diagnosis not present

## 2018-02-18 IMAGING — DX DG CHEST 1V PORT
1 series · 1 of 1 positions shown · non-contrast
Comparison: 05/29/2015

CLINICAL DATA: Nausea and vomiting starting today.

EXAM:
PORTABLE CHEST 1 VIEW

[chest ap]
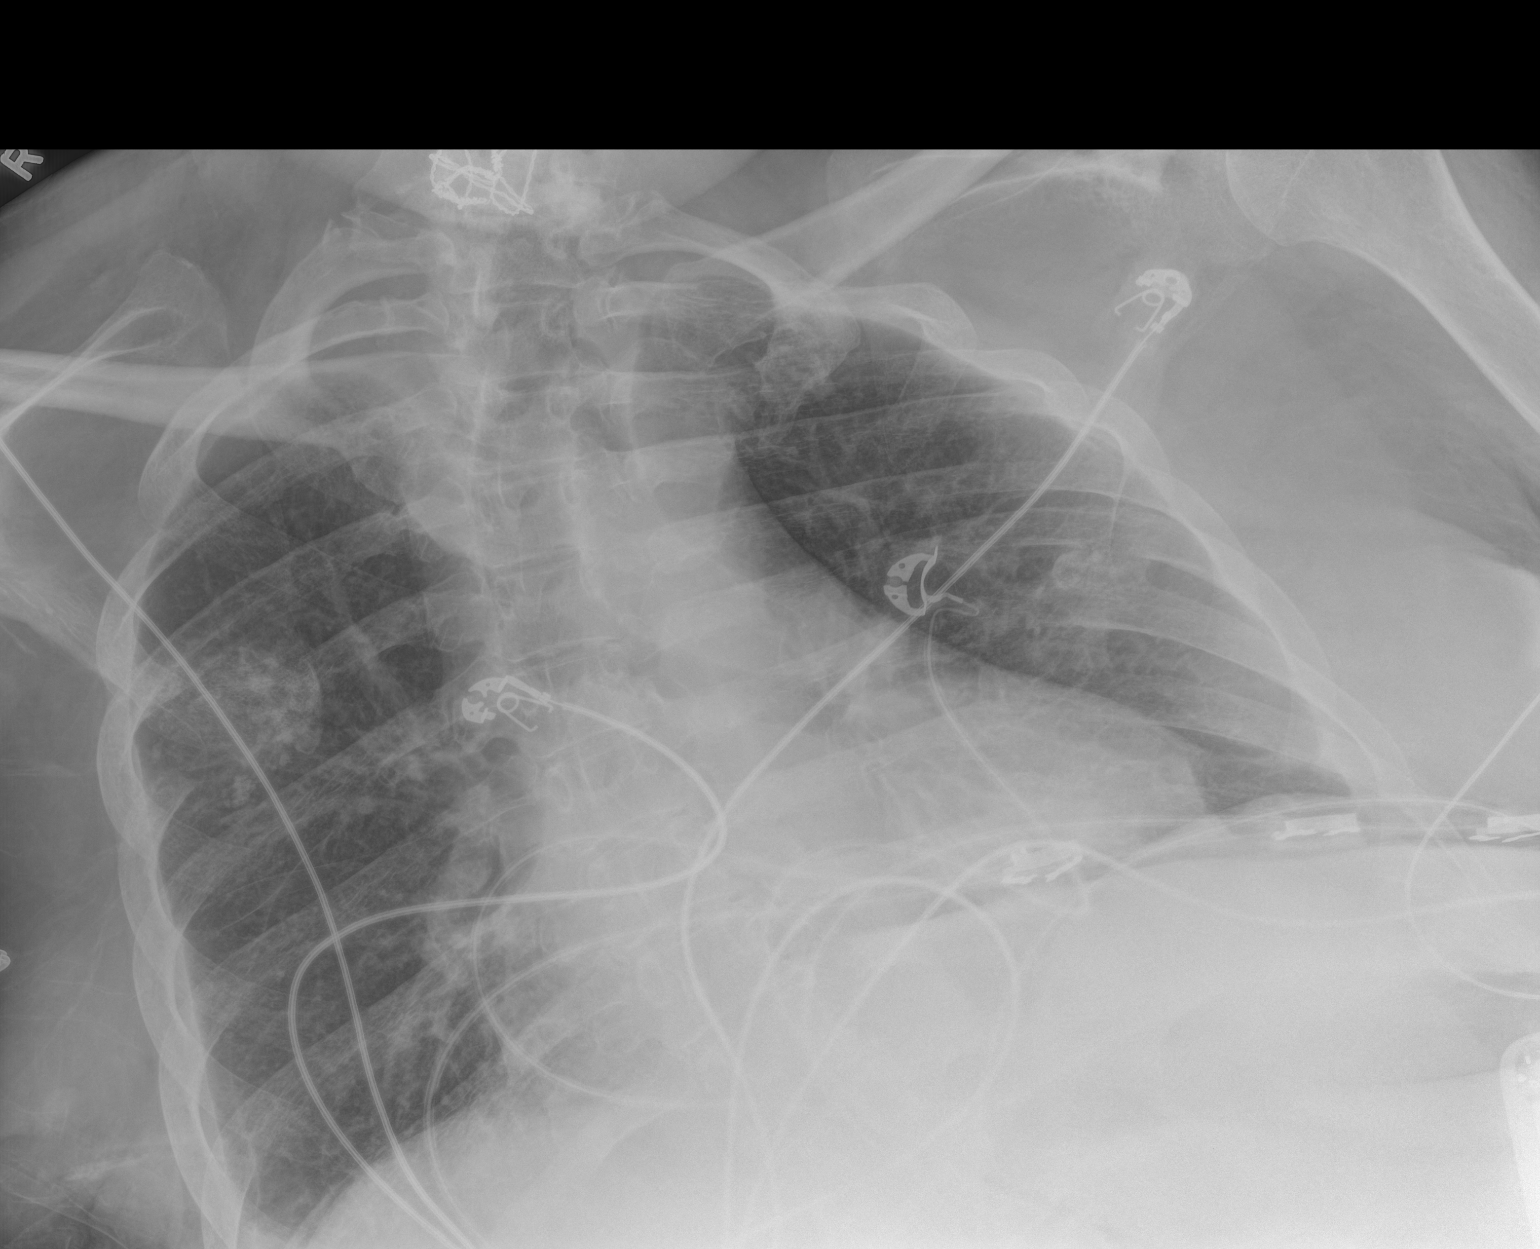

[1 of 1 positions shown; findings below may reference images not displayed]

FINDINGS: 9244 hours. Semi upright rotated portable study. No evidence for
airspace pulmonary edema or substantial pleural effusion. No focal
airspace consolidation. The cardio pericardial silhouette is
enlarged. The previously described destructive lesion involving the
posterior right fifth rib is superimposed upon the inferior right
scapula on today's study. Telemetry leads overlie the chest.
IMPRESSION: Cardiomegaly with chronic interstitial changes. No overt pulmonary
edema or focal lung consolidation.

Destructive lesion involving the right fifth rib is superimposed on
the inferior scapula today.

## 2018-02-19 IMAGING — DX DG CHEST 1V PORT
1 series · 1 of 1 positions shown · non-contrast
Comparison: Chest radiograph performed 07/07/2015

CLINICAL DATA: Central line placement.  Initial encounter.

EXAM:
PORTABLE CHEST 1 VIEW

[chest ap]
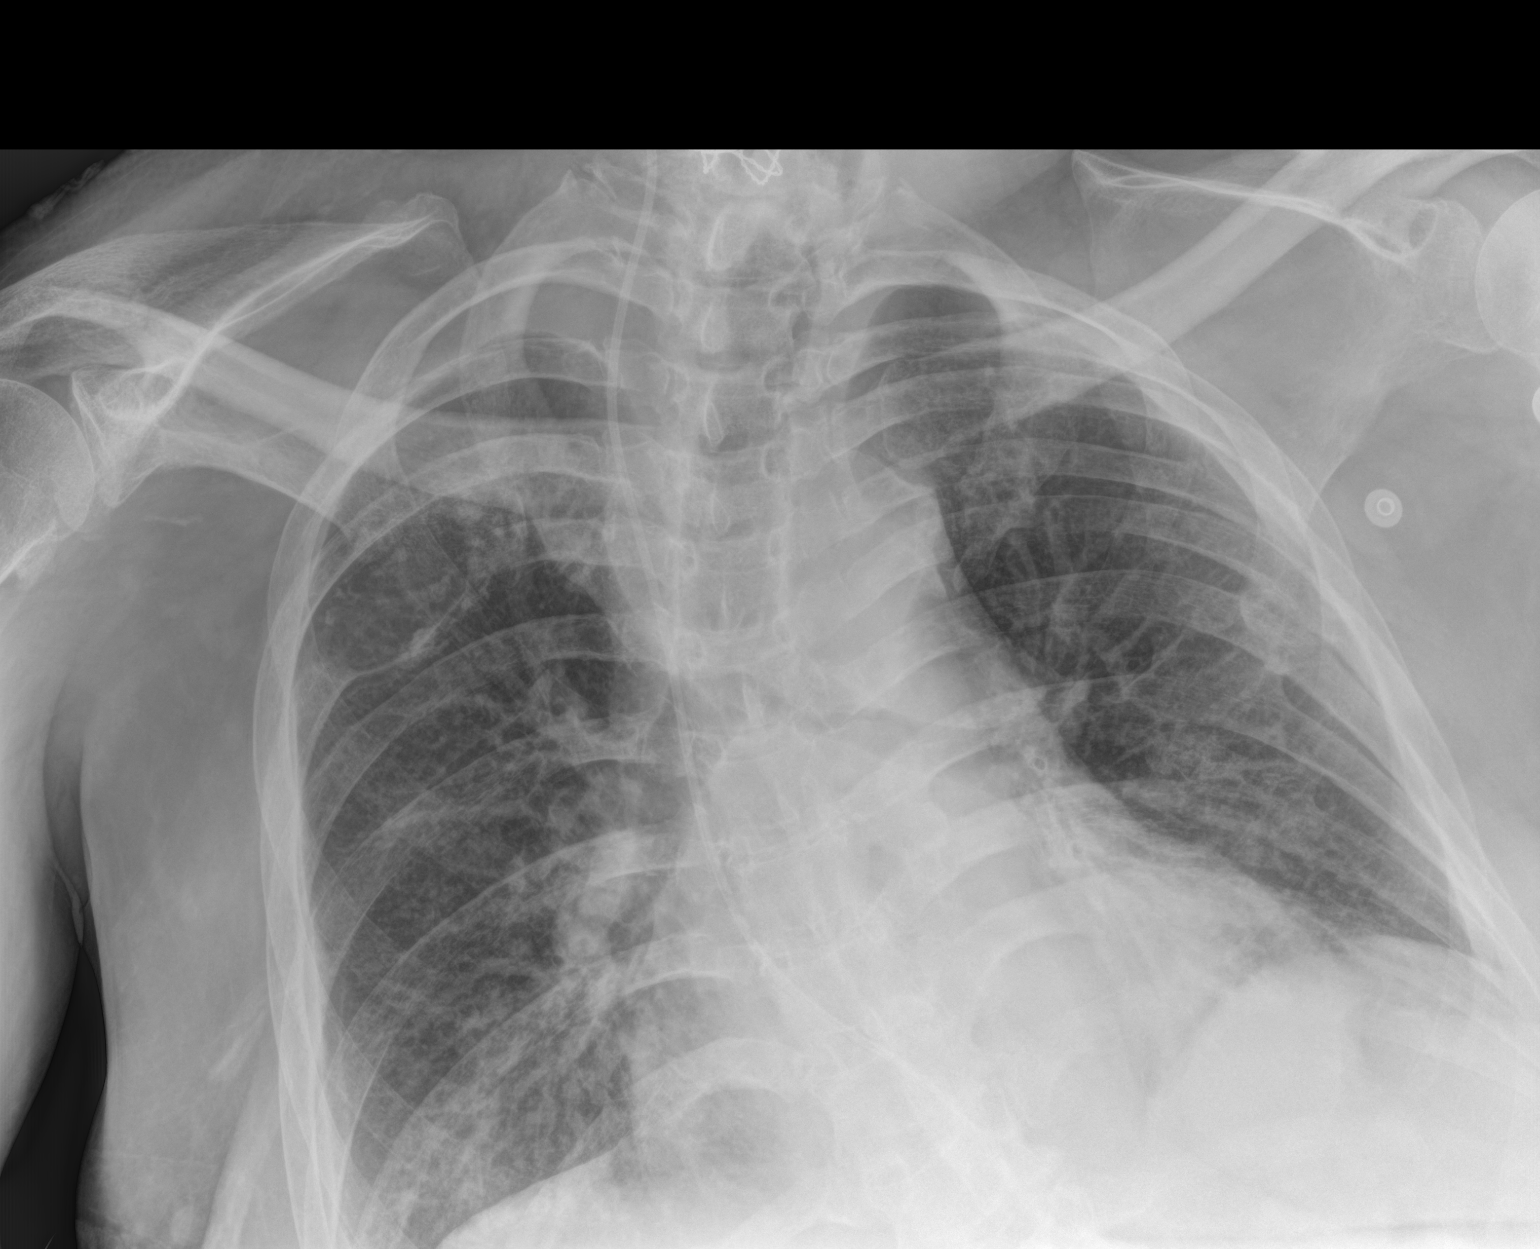

[1 of 1 positions shown; findings below may reference images not displayed]

FINDINGS: The patient's right IJ line is noted ending about the expected
location of the tricuspid valve. This should be retracted
approximately 12 cm.

A small right pleural effusion is noted. Mild vascular congestion is
seen, with mildly increased interstitial markings, raising question
for minimal interstitial edema. No pneumothorax is seen.

The cardiomediastinal silhouette is borderline enlarged. No acute
osseous abnormalities are identified. Postoperative change is noted
along the lower cervical spine.
IMPRESSION: 1. Right IJ line noted ending about the expected location of the
tricuspid valve. This should be retracted approximately 12 cm to the
cavoatrial junction.
2. Small right pleural effusion. Mild vascular congestion and
borderline cardiomegaly. Mildly increased interstitial markings
raise question for minimal interstitial edema.

These results were called by telephone at the time of interpretation
on 07/08/2015 at [DATE] to Dr. BAMBUCAFE TARLA, who verbally
acknowledged these results.

## 2018-02-19 IMAGING — DX DG CHEST 1V PORT
1 series · 1 of 1 positions shown · non-contrast
Comparison: Chest radiograph performed earlier today at [DATE] a.m.

CLINICAL DATA: Central line repositioning.  Initial encounter.

EXAM:
PORTABLE CHEST 1 VIEW

[chest ap]
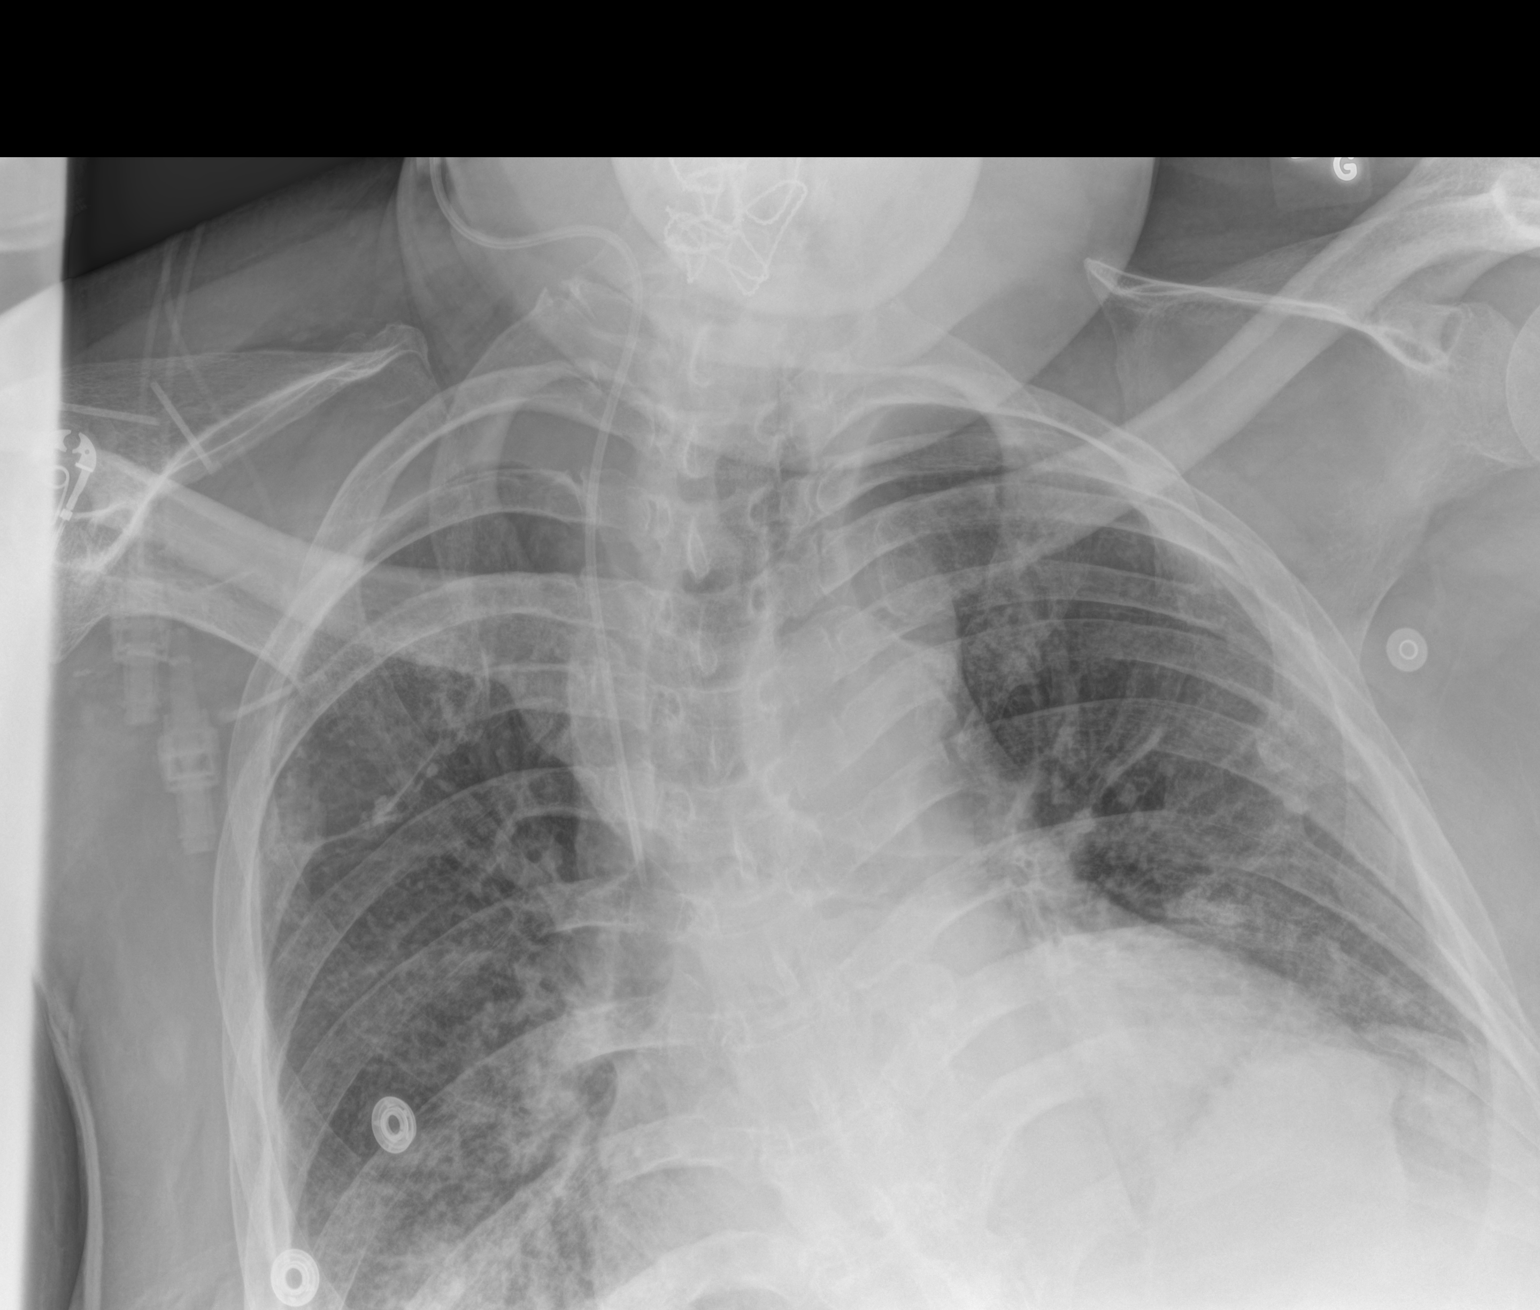

[1 of 1 positions shown; findings below may reference images not displayed]

FINDINGS: The patient's right IJ line is noted ending about the mid to distal
SVC.

Mildly increased interstitial markings may reflect mild interstitial
edema. Vascular congestion is noted. The right costophrenic angle is
incompletely imaged on this study. A small right pleural effusion is
again noted. No pneumothorax is seen.

The cardiomediastinal silhouette is mildly enlarged. No acute
osseous abnormalities are seen. Postoperative change is seen
overlying the cervical spine.
IMPRESSION: 1. Right IJ line noted ending about the mid to distal SVC.
2. Mildly increased interstitial markings may reflect mild
interstitial edema. Vascular congestion and mild cardiomegaly noted.
Small right pleural effusion again seen.

## 2018-02-21 DIAGNOSIS — M869 Osteomyelitis, unspecified: Secondary | ICD-10-CM | POA: Diagnosis not present

## 2018-02-21 DIAGNOSIS — N390 Urinary tract infection, site not specified: Secondary | ICD-10-CM | POA: Diagnosis not present

## 2018-02-28 DIAGNOSIS — Z09 Encounter for follow-up examination after completed treatment for conditions other than malignant neoplasm: Secondary | ICD-10-CM | POA: Diagnosis not present

## 2018-02-28 DIAGNOSIS — M86652 Other chronic osteomyelitis, left thigh: Secondary | ICD-10-CM | POA: Diagnosis not present

## 2018-02-28 DIAGNOSIS — L8945 Pressure ulcer of contiguous site of back, buttock and hip, unstageable: Secondary | ICD-10-CM | POA: Diagnosis not present

## 2018-02-28 DIAGNOSIS — N390 Urinary tract infection, site not specified: Secondary | ICD-10-CM | POA: Diagnosis not present

## 2018-03-07 DIAGNOSIS — N390 Urinary tract infection, site not specified: Secondary | ICD-10-CM | POA: Diagnosis not present

## 2018-03-09 IMAGING — US US THYROID
1 series · 13 of 25 positions shown · non-contrast
Comparison: CT scan of the chest 07/19/2016

CLINICAL DATA: Incidental on CT. 51-year-old male with left thyroid
nodule seen on prior CT scan.

EXAM:
THYROID ULTRASOUND
TECHNIQUE: Ultrasound examination of the thyroid gland and adjacent soft
tissues was performed.

[Series 1: us thyroid · 0.10mm/px · 13 of 41 slices shown]
[im 1/41]
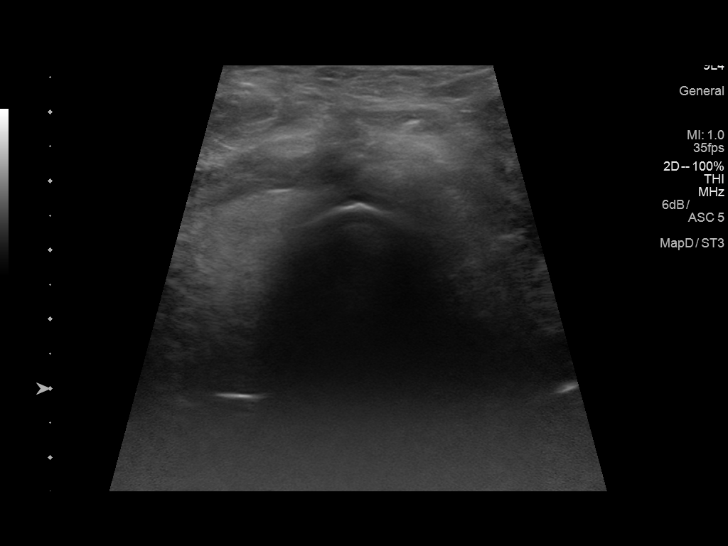
[im 4/41]
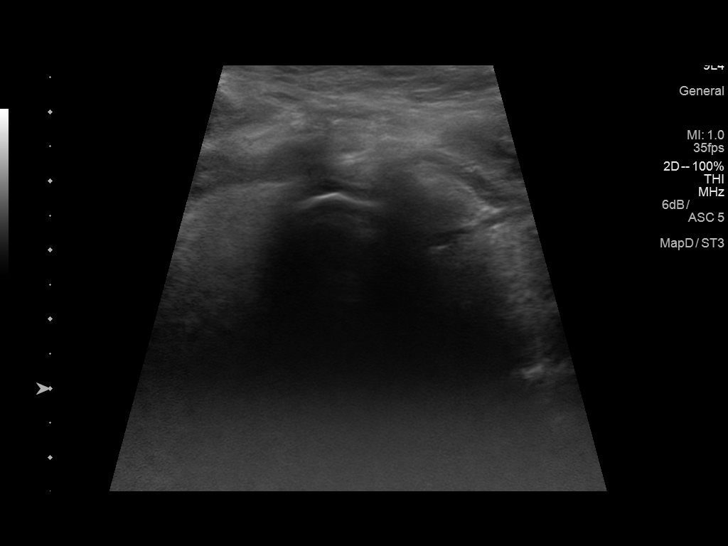
[im 7/41]
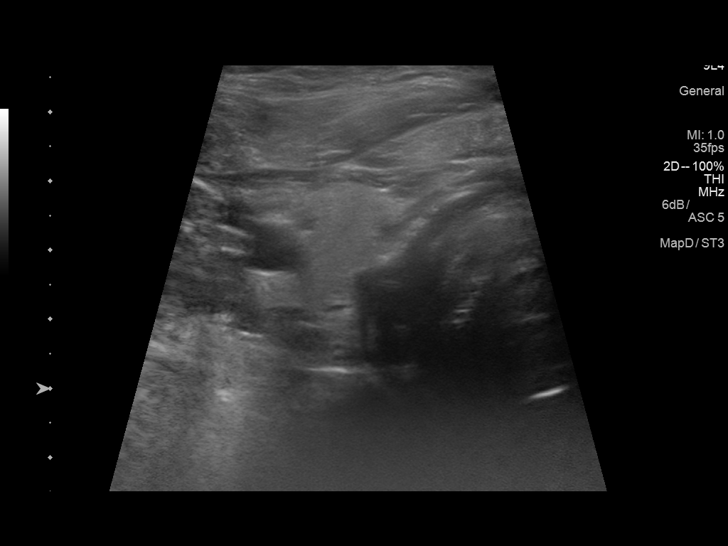
[im 11/41]
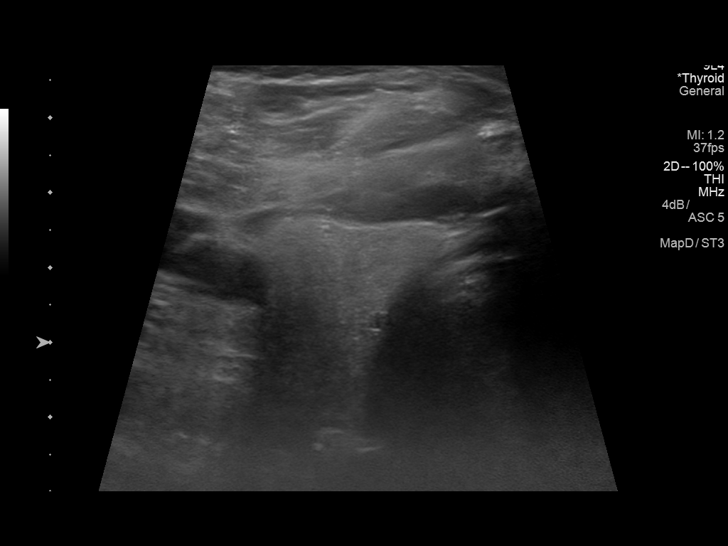
[im 14/41]
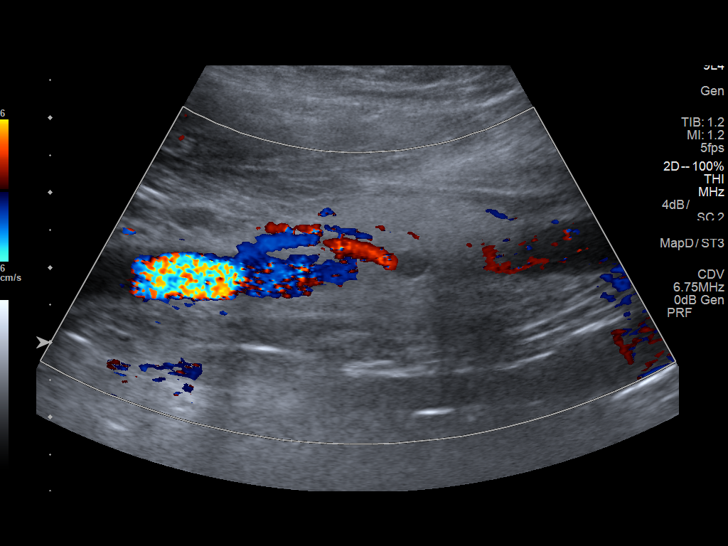
[im 17/41]
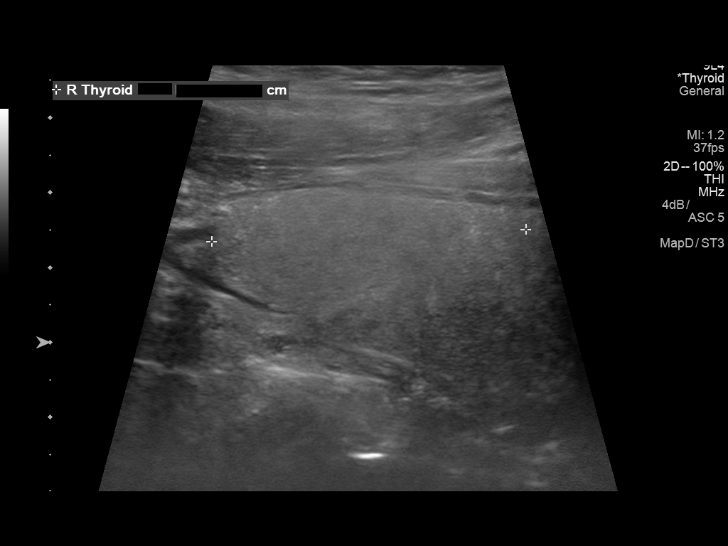
[im 21/41]
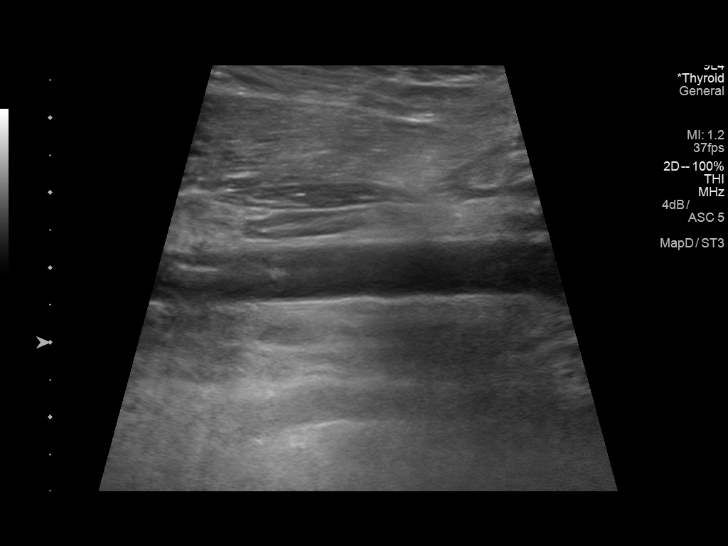
[im 24/41]
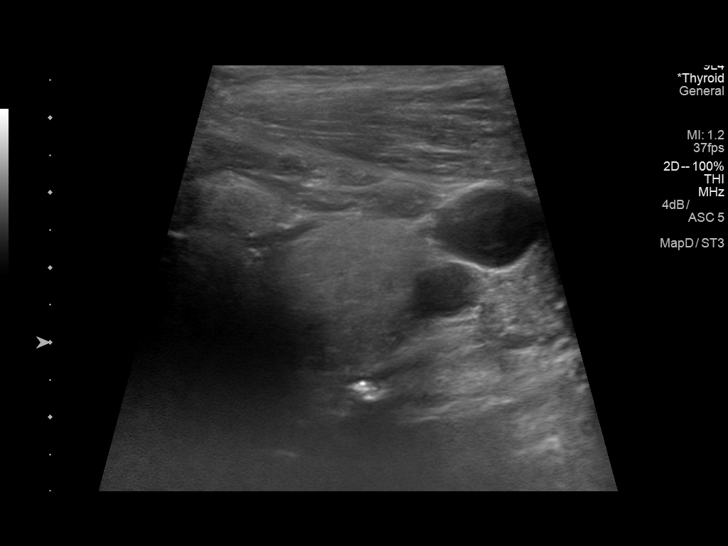
[im 27/41]
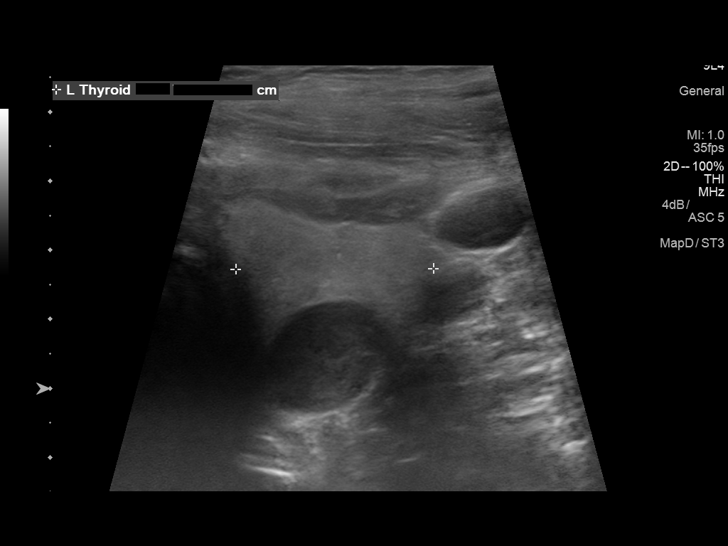
[im 31/41]
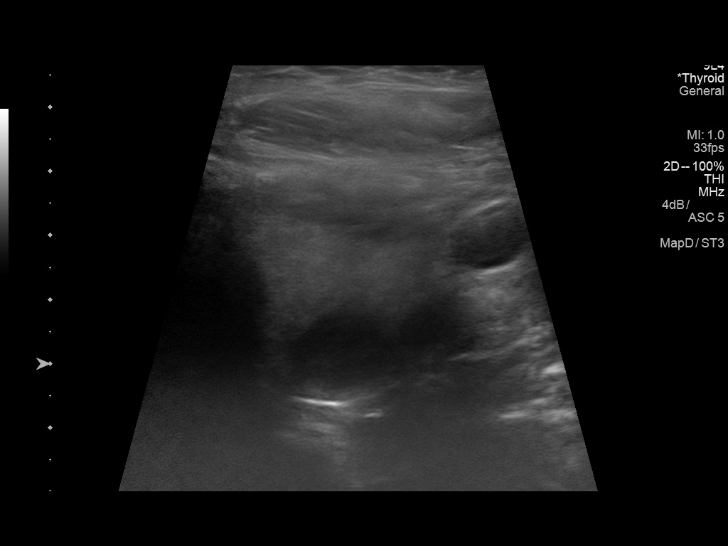
[im 34/41]
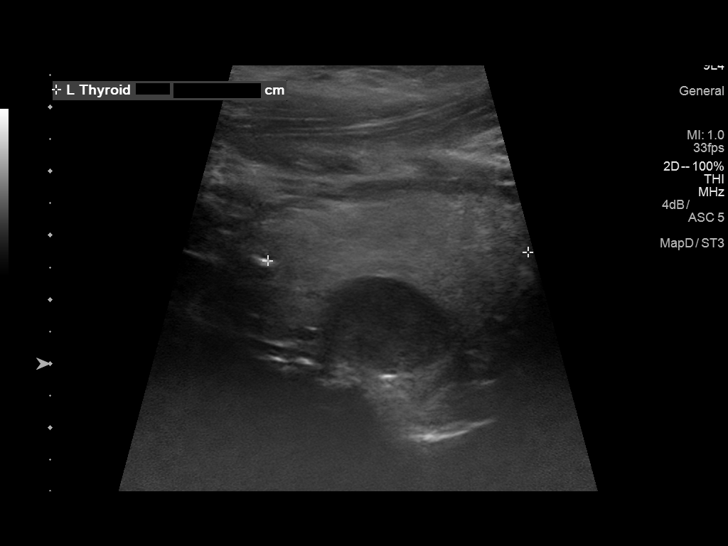
[im 37/41]
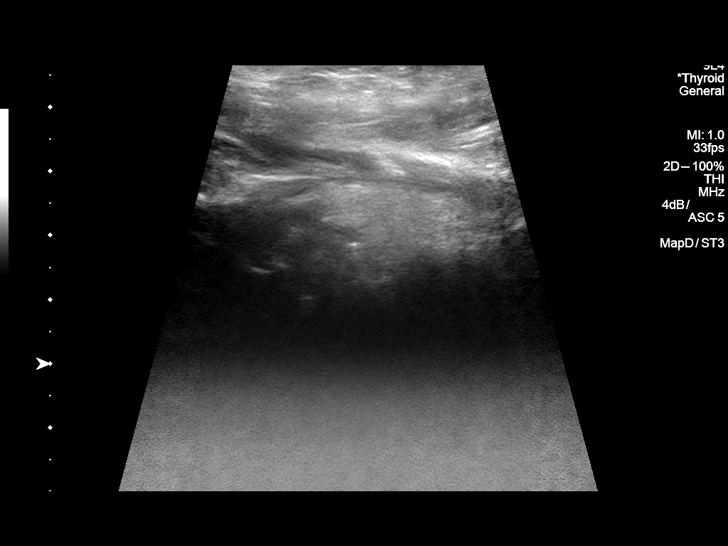
[im 41/41]
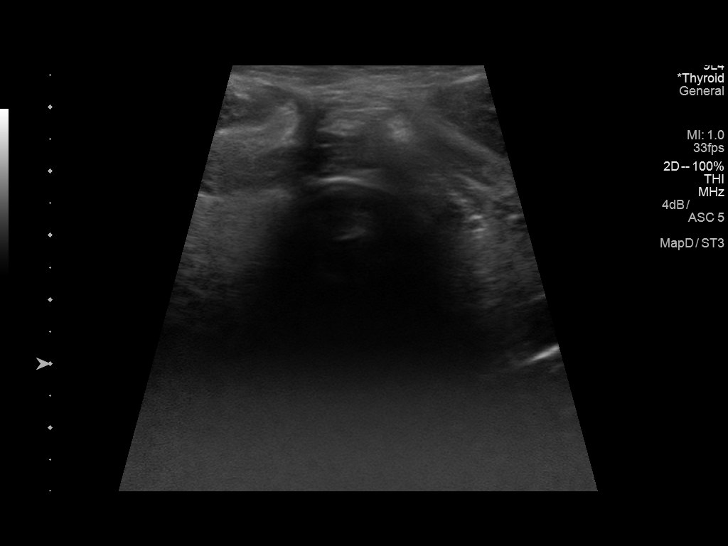

[13 of 25 positions shown; findings below may reference images not displayed]

FINDINGS: Parenchymal Echotexture: Mildly heterogenous

Isthmus: 0.5 cm

Right lobe: 4.2 x 1.4 x 2.3 cm

Left lobe: 4.1 x 2.9 x 3.1 cm

_________________________________________________________

Estimated total number of nodules >/= 1 cm: 1

Number of spongiform nodules >/=  2 cm not described below (TR1): 0

Number of mixed cystic and solid nodules >/= 1.5 cm not described
below (TR2): 0

_________________________________________________________

Nodule # 1:

Location: Left; Inferior

Maximum size: 2.0 cm; Other 2 dimensions: 1.7 x 1.6 cm

Composition: solid/almost completely solid (2)

Echogenicity: very hypoechoic (3)

Shape: not taller-than-wide (0)

Margins: smooth (0)

Echogenic foci: none (0)

ACR TI-RADS total points: 5.

ACR TI-RADS risk category: TR4 (4-6 points).

ACR TI-RADS recommendations:

**Given size (>/= 1.5 cm) and appearance, fine needle aspiration of
this moderately suspicious nodule should be considered based on
TI-RADS criteria.

_________________________________________________________

Tiny 0.6 cm hypoechoic nodule in the right mid gland does not meet
criteria for further evaluation.
IMPRESSION: The 2.0 cm TI-RADS category 4 nodule in the left inferior gland
meets criteria for consideration of fine-needle aspiration biopsy.

The above is in keeping with the ACR TI-RADS recommendations - [HOSPITAL] 7147;[DATE].

## 2018-03-13 DIAGNOSIS — L89154 Pressure ulcer of sacral region, stage 4: Secondary | ICD-10-CM | POA: Diagnosis not present

## 2018-03-13 DIAGNOSIS — L97513 Non-pressure chronic ulcer of other part of right foot with necrosis of muscle: Secondary | ICD-10-CM | POA: Diagnosis not present

## 2018-03-13 DIAGNOSIS — L89613 Pressure ulcer of right heel, stage 3: Secondary | ICD-10-CM | POA: Diagnosis not present

## 2018-03-13 DIAGNOSIS — L89104 Pressure ulcer of unspecified part of back, stage 4: Secondary | ICD-10-CM | POA: Diagnosis not present

## 2018-03-14 DIAGNOSIS — M869 Osteomyelitis, unspecified: Secondary | ICD-10-CM | POA: Diagnosis not present

## 2018-03-14 DIAGNOSIS — N390 Urinary tract infection, site not specified: Secondary | ICD-10-CM | POA: Diagnosis not present

## 2018-03-15 DIAGNOSIS — R112 Nausea with vomiting, unspecified: Secondary | ICD-10-CM | POA: Diagnosis present

## 2018-03-19 ENCOUNTER — Other Ambulatory Visit: Payer: Self-pay | Admitting: Nurse Practitioner

## 2018-03-20 ENCOUNTER — Encounter (HOSPITAL_BASED_OUTPATIENT_CLINIC_OR_DEPARTMENT_OTHER): Payer: Medicare Other | Attending: Internal Medicine

## 2018-03-20 DIAGNOSIS — L89224 Pressure ulcer of left hip, stage 4: Secondary | ICD-10-CM | POA: Diagnosis not present

## 2018-03-20 DIAGNOSIS — L89154 Pressure ulcer of sacral region, stage 4: Secondary | ICD-10-CM | POA: Diagnosis not present

## 2018-03-20 DIAGNOSIS — I10 Essential (primary) hypertension: Secondary | ICD-10-CM | POA: Insufficient documentation

## 2018-03-20 DIAGNOSIS — L89323 Pressure ulcer of left buttock, stage 3: Secondary | ICD-10-CM | POA: Insufficient documentation

## 2018-03-20 DIAGNOSIS — L89214 Pressure ulcer of right hip, stage 4: Secondary | ICD-10-CM | POA: Insufficient documentation

## 2018-03-20 DIAGNOSIS — L97822 Non-pressure chronic ulcer of other part of left lower leg with fat layer exposed: Secondary | ICD-10-CM | POA: Diagnosis not present

## 2018-03-20 DIAGNOSIS — L89894 Pressure ulcer of other site, stage 4: Secondary | ICD-10-CM | POA: Diagnosis not present

## 2018-03-20 DIAGNOSIS — G4730 Sleep apnea, unspecified: Secondary | ICD-10-CM | POA: Diagnosis not present

## 2018-03-20 DIAGNOSIS — L89890 Pressure ulcer of other site, unstageable: Secondary | ICD-10-CM | POA: Insufficient documentation

## 2018-03-20 DIAGNOSIS — G8250 Quadriplegia, unspecified: Secondary | ICD-10-CM | POA: Insufficient documentation

## 2018-03-20 DIAGNOSIS — L89893 Pressure ulcer of other site, stage 3: Secondary | ICD-10-CM | POA: Diagnosis not present

## 2018-03-21 DIAGNOSIS — M869 Osteomyelitis, unspecified: Secondary | ICD-10-CM | POA: Diagnosis not present

## 2018-03-21 DIAGNOSIS — N390 Urinary tract infection, site not specified: Secondary | ICD-10-CM | POA: Diagnosis not present

## 2018-03-26 ENCOUNTER — Other Ambulatory Visit: Payer: Self-pay | Admitting: Nurse Practitioner

## 2018-03-31 IMAGING — DX DG ABD PORTABLE 1V
2 series · 2 of 2 positions shown · non-contrast
Comparison: 05/29/2015 CT scan

CLINICAL DATA: Nausea vomiting and abdominal distention

EXAM:
PORTABLE ABDOMEN - 1 VIEW

[abdomen supine (1 of 2)]
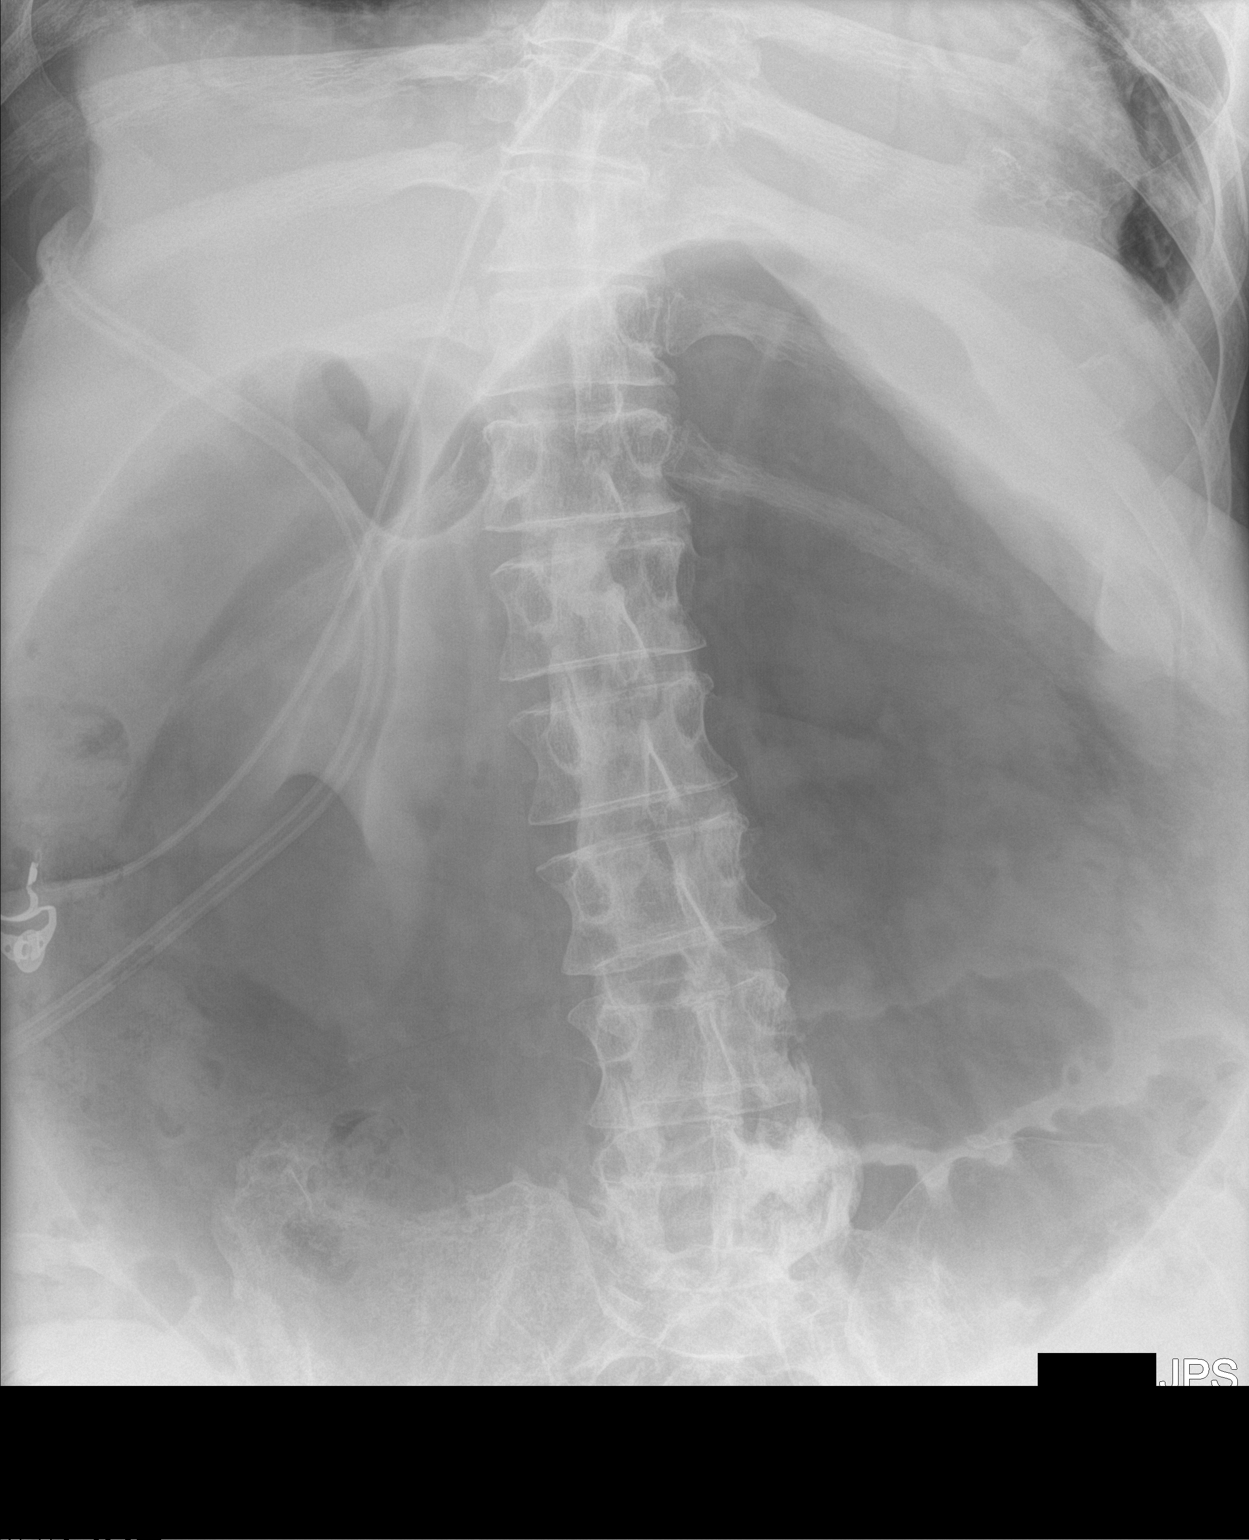

[abdomen supine (2 of 2)]
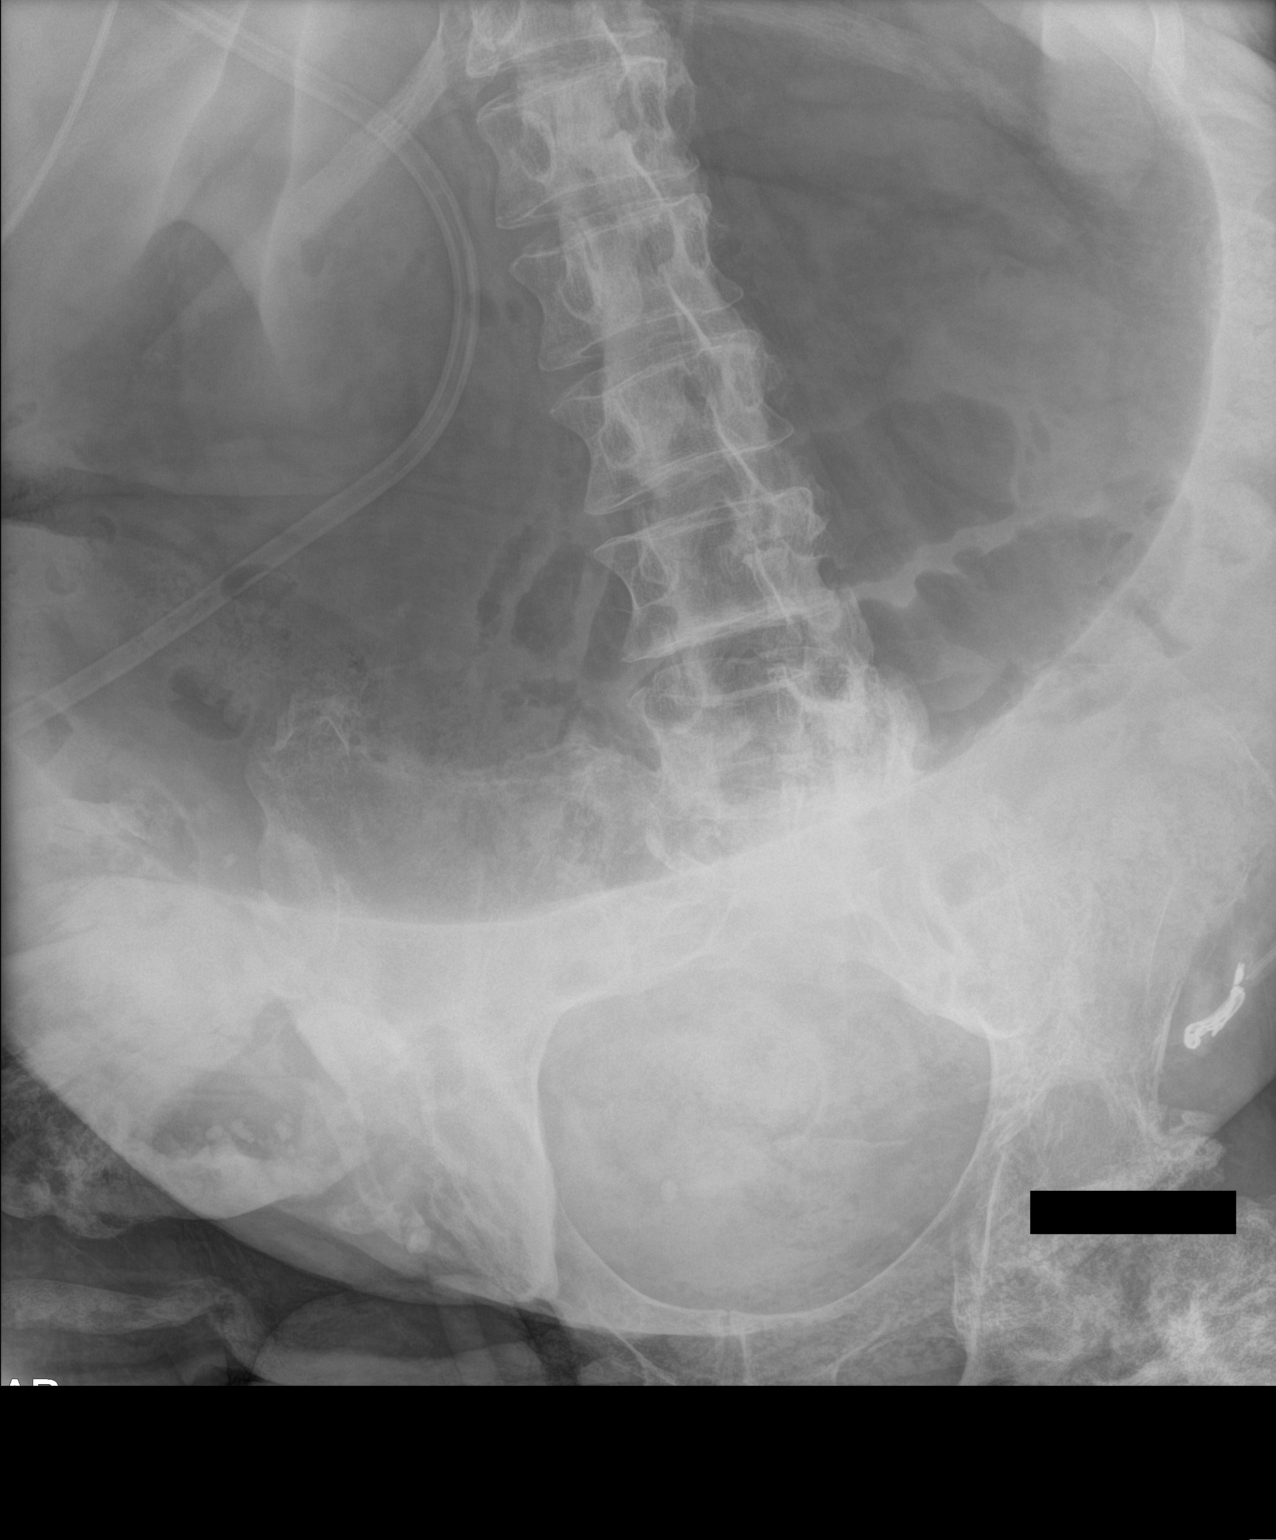

[2 of 2 positions shown; findings below may reference images not displayed]

FINDINGS: There is pronounced severe gastric distention. There is gas further
into bowel.
IMPRESSION: Although there is some gas beyond the stomach, there is severe
gastric gaseous distention. Possibility of gastric outlet
obstruction not excluded.

## 2018-03-31 IMAGING — CT CT ABD-PELV W/ CM
2 of 4 series · 8 of 46 positions shown, 9 images · IV contrast (Iodine)
Comparison: Abdominal radiograph earlier this day. Most recent CT
05/29/2015

CLINICAL DATA: Small bowel obstruction.

EXAM:
CT ABDOMEN AND PELVIS WITH CONTRAST
TECHNIQUE: Multidetector CT imaging of the abdomen and pelvis was performed
using the standard protocol following bolus administration of
intravenous contrast.
CONTRAST:  100mL [id number] IOPAMIDOL ([id number]) INJECTION 61%

[Series 201: routine, idose (2) · axial · 0.94mm/px · z∈[+105,+450]mm · 5 of 91 slices shown, 6 images]
[im 13/91  soft-tissue]
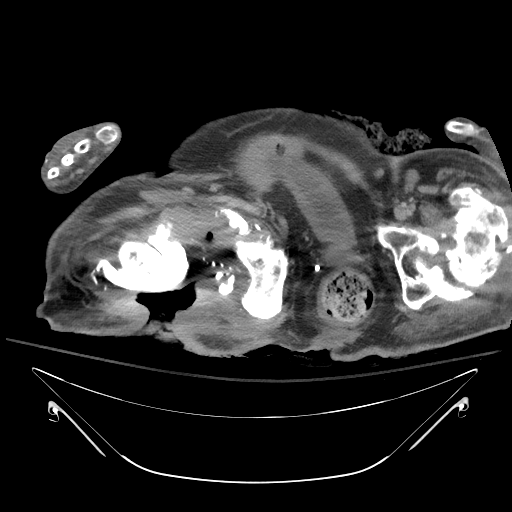
[im 13/91  bone]
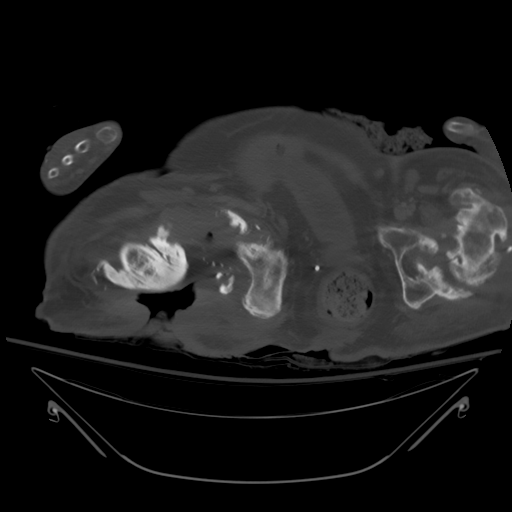
[im 31/91  soft-tissue]
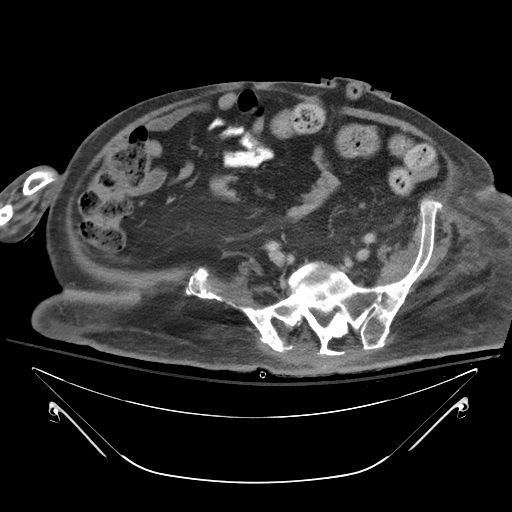
[im 48/91  soft-tissue]
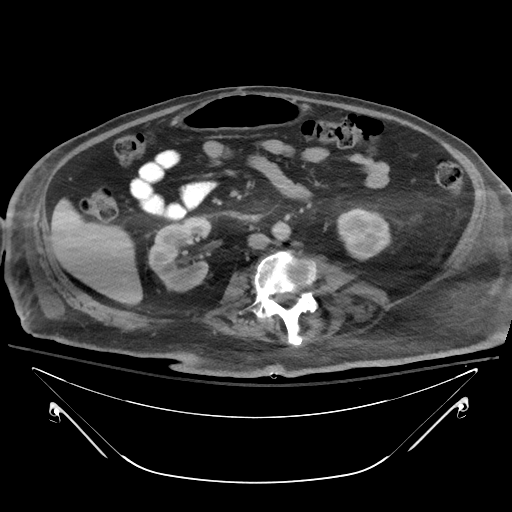
[im 65/91  soft-tissue]
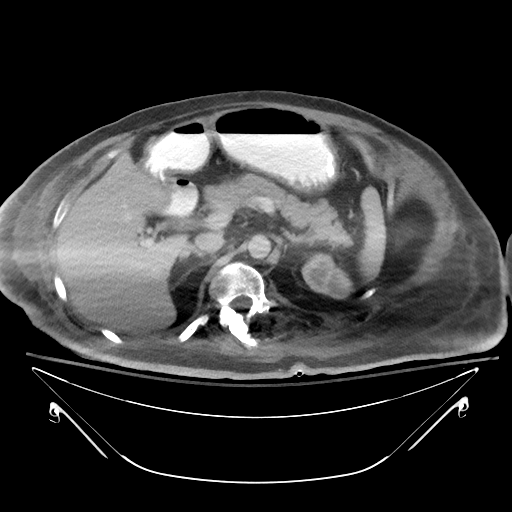
[im 82/91  soft-tissue]
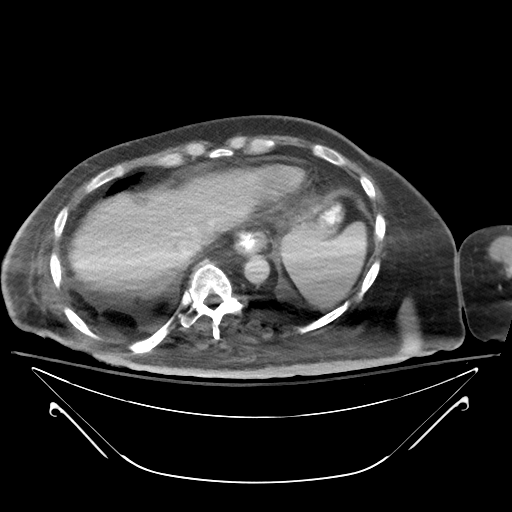

[Series 206: coronals, idose (2) · coronal · 0.45mm/px · 3 of 112 slices shown]
[im 38/112  soft-tissue]
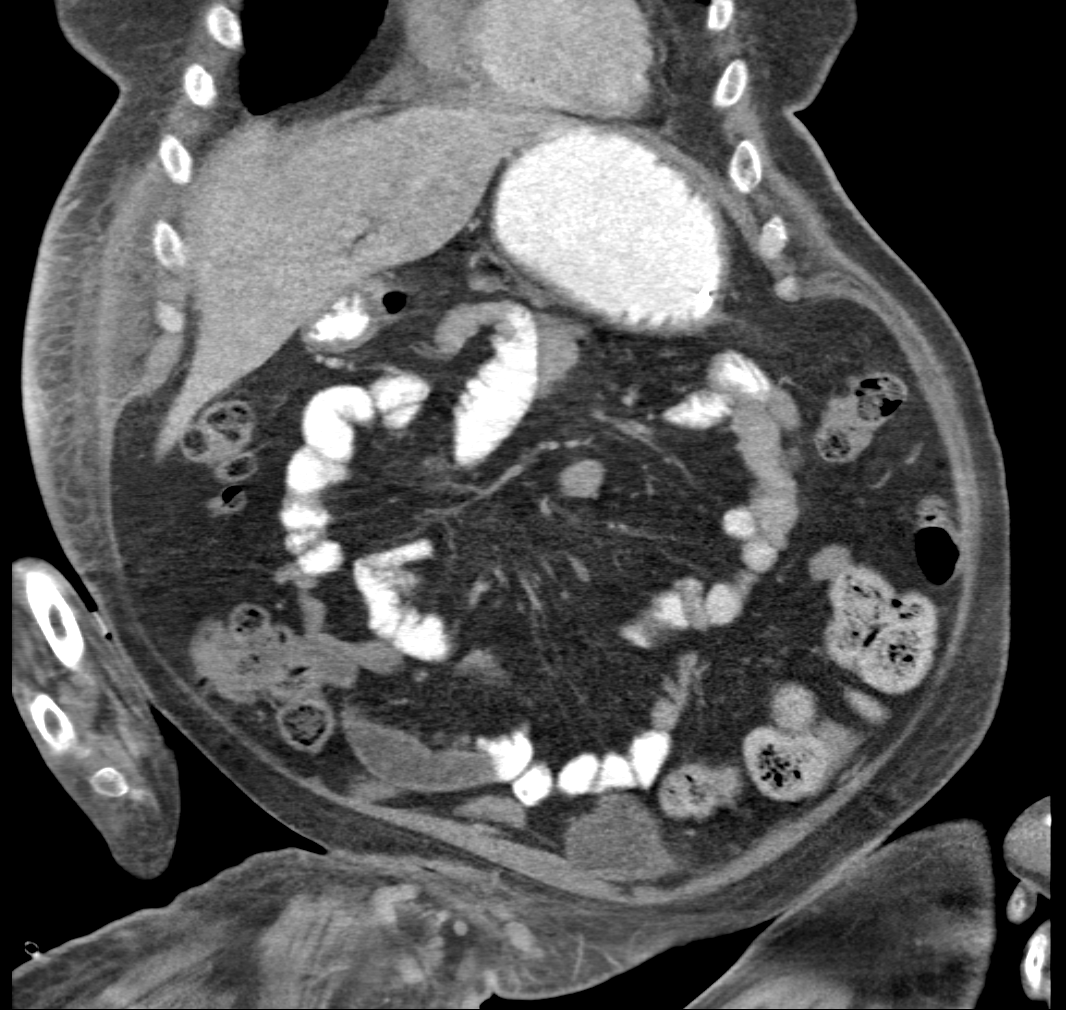
[im 50/112  soft-tissue]
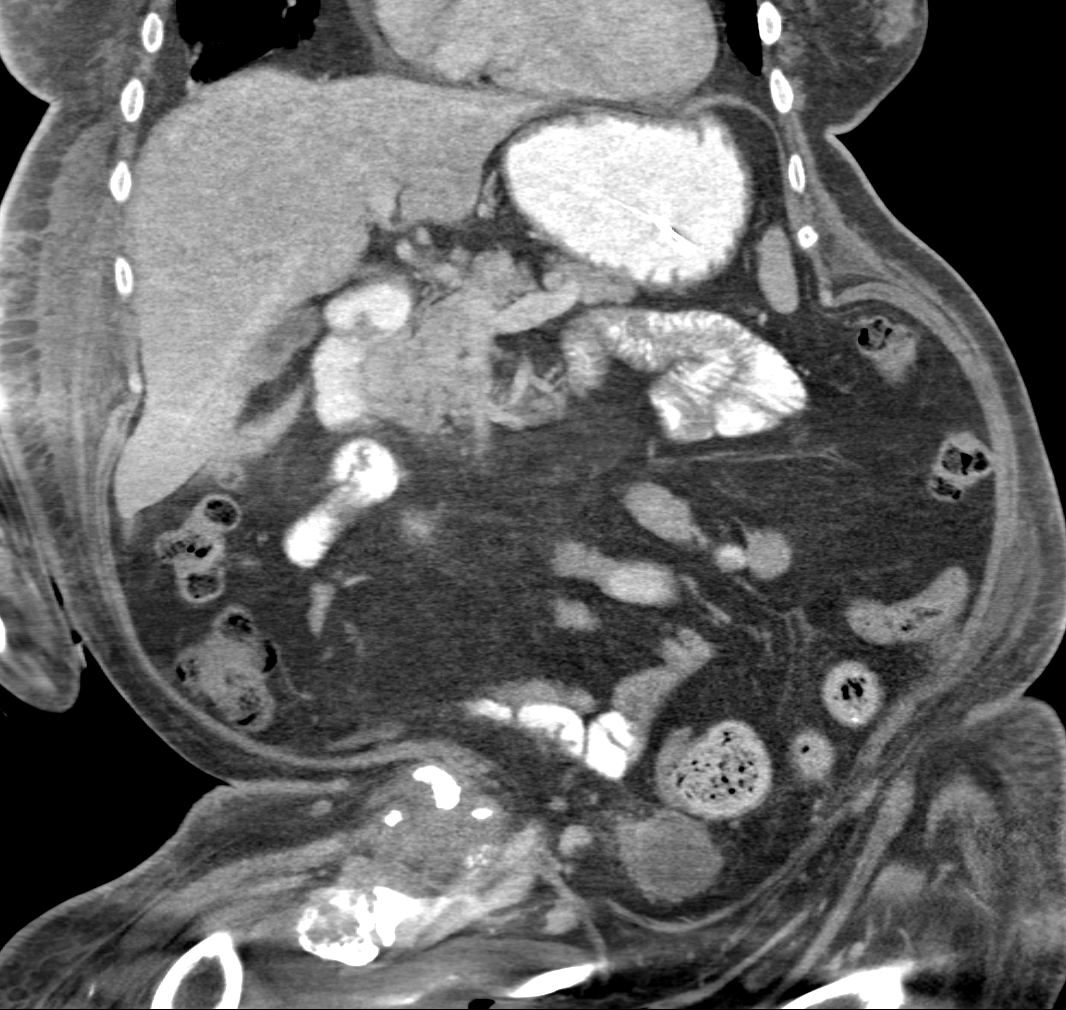
[im 62/112  soft-tissue]
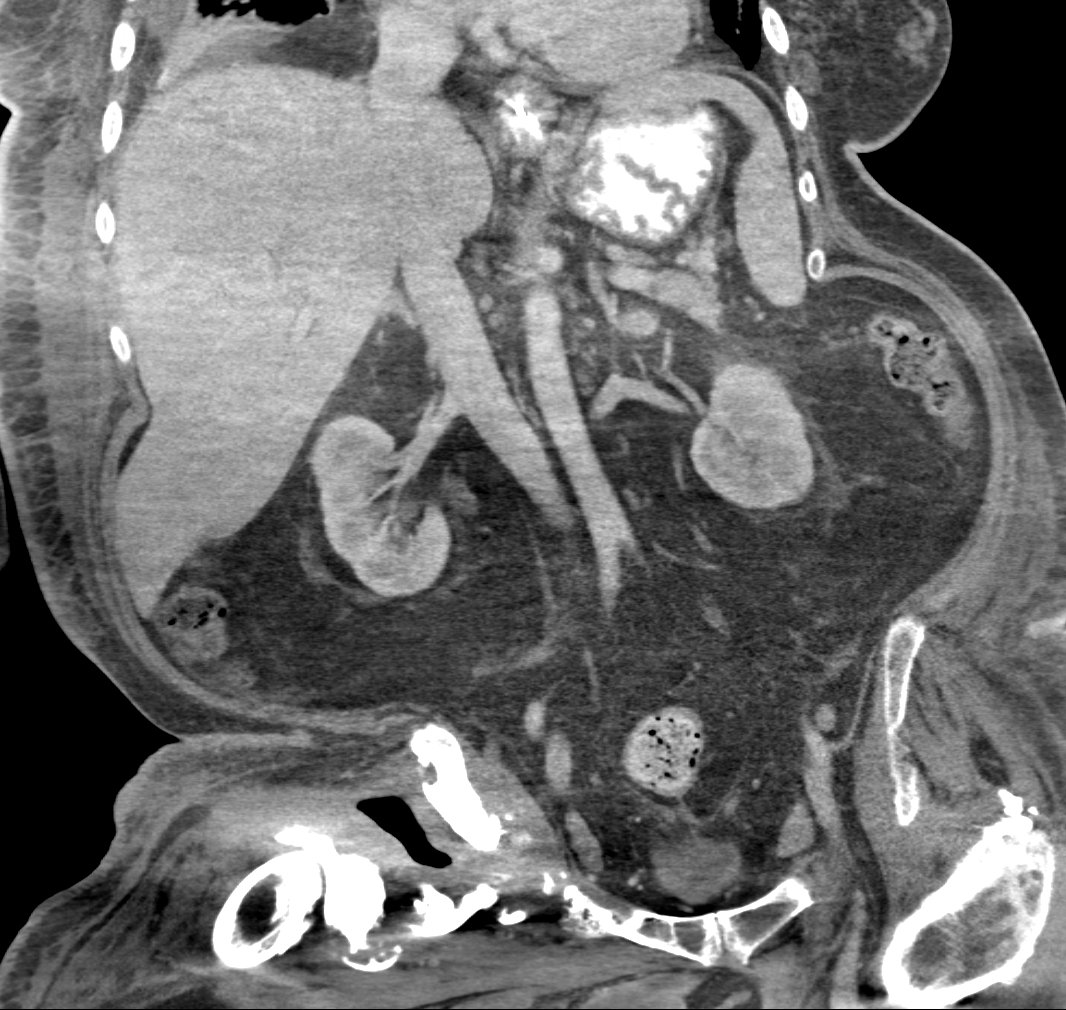

[8 of 46 positions shown; findings below may reference images not displayed]

FINDINGS: Lower chest: Moderate bilateral pleural effusions, right greater
than left. Adjacent compressive atelectasis in both lower lobes.
Small pericardial effusion. Probable decubitus ulcer involving the
dependent right lower posterior hamate thorax. No subjacent bony
destructive change of the right ribs, however there is increased
scleroses of the lateral right tenth rib. This ulcer and rib change
and new from prior CT. Remote left rib fractures.

Liver: No focal lesion.

Hepatobiliary: Gallbladder decompressed, no calcified stone. No
biliary dilatation.

Pancreas: No ductal dilatation or inflammation.

Spleen: Normal.

Adrenal glands: No nodule.

Kidneys: Left greater than right perinephric stranding, increased
from prior CT. Small left renal cyst. Symmetric renal excretion. No
definite striated nephrogram is seen.

Stomach/Bowel: Enteric tube within the stomach. Decreased gastric
distention from radiographs earlier this day. There is enteric
contrast within the distal esophagus, stomach, and proximal small
bowel. No gastric wall thickening or external mass effect to suggest
gastric outlet obstruction. No small bowel dilatation. Descending
colostomy with small to moderate stool burden. Long Hartmann's pouch
with retained stool again seen. There is rectal wall thickening.

Vascular/Lymphatic: Multiple retroperitoneal lymph nodes measuring
up to 8 mm short axis, unchanged from prior exam. Enlarged external
iliac lymph nodes bilaterally, also not significantly changed.
Tortuous abdominal aorta. There is a retro aortic left renal vein.

Reproductive: Prostate gland normal in size.

Bladder: Supra-pubic catheter in place. Mild wall thickening appears
chronic.

Other: No free air or intra-abdominal fluid collection.

Musculoskeletal: Chronic sacral decubitus ulcer extending to bone.
Chronic osteomyelitis of both hips with decubitus ulcer on the
right. Chronic multifocal pelvic sclerosis and sequela of chronic
osteomyelitis.
IMPRESSION: 1. No evidence of bowel obstruction. Decreased gastric distention
with enteric tube in place. No findings to suggest gastric outlet or
small bowel obstruction.
2. Probable new decubitus ulcer involving the right lower posterior
thorax with scleroses of the right posterior tenth rib, new from
exam 3 months prior, and concerning for rib osteomyelitis. Sacral
decubitus ulcer with chronic osteomyelitis of the pelvis and both
hips appears unchanged from prior.
3. Bilateral pleural effusions and compressive atelectasis,
increased from prior. Trace pericardial effusion.
4. Increased left perinephric stranding from prior, urinary tract
infection not excluded.
5. Additional chronic findings as described.

## 2018-03-31 IMAGING — XA IR FLUORO GUIDE CV LINE*R*
1 series · 2 of 2 positions shown · non-contrast
Comparison: none

CLINICAL DATA: Bacteremia, needs IV access for infusion therapy

EXAM:
PICC PLACEMENT WITH ULTRASOUND AND FLUOROSCOPY
FLUOROSCOPY TIME:  0.1 minute, 6 uNymN DAP
TECHNIQUE: After written informed consent was obtained, patient was placed in
the supine position on angiographic table. Patency of the right
brachial vein was confirmed with ultrasound with image
documentation. An appropriate skin site was determined. Skin site
was marked. Region was prepped using maximum barrier technique
including cap and mask, sterile gown, sterile gloves, large sterile
sheet, and Chlorhexidine as cutaneous antisepsis. The region was
infiltrated locally with 1% lidocaine. Under real-time ultrasound
guidance, the right brachial vein was accessed with a 21 gauge
micropuncture needle; the needle tip within the vein was confirmed
with ultrasound image documentation. Needle exchanged over a 018
guidewire for a peel-away sheath, through which a 5-French
double-lumen power injectable PICC trimmed to 41cm was advanced,
positioned with its tip near the cavoatrial junction. Spot chest
radiograph confirms appropriate catheter position. Catheter was
flushed per protocol and secured externally. The patient tolerated
procedure well.
COMPLICATIONS:
COMPLICATIONS
none

[Series 1: fl (-) angio · 2 of 2 slices shown]
[im 1/2]
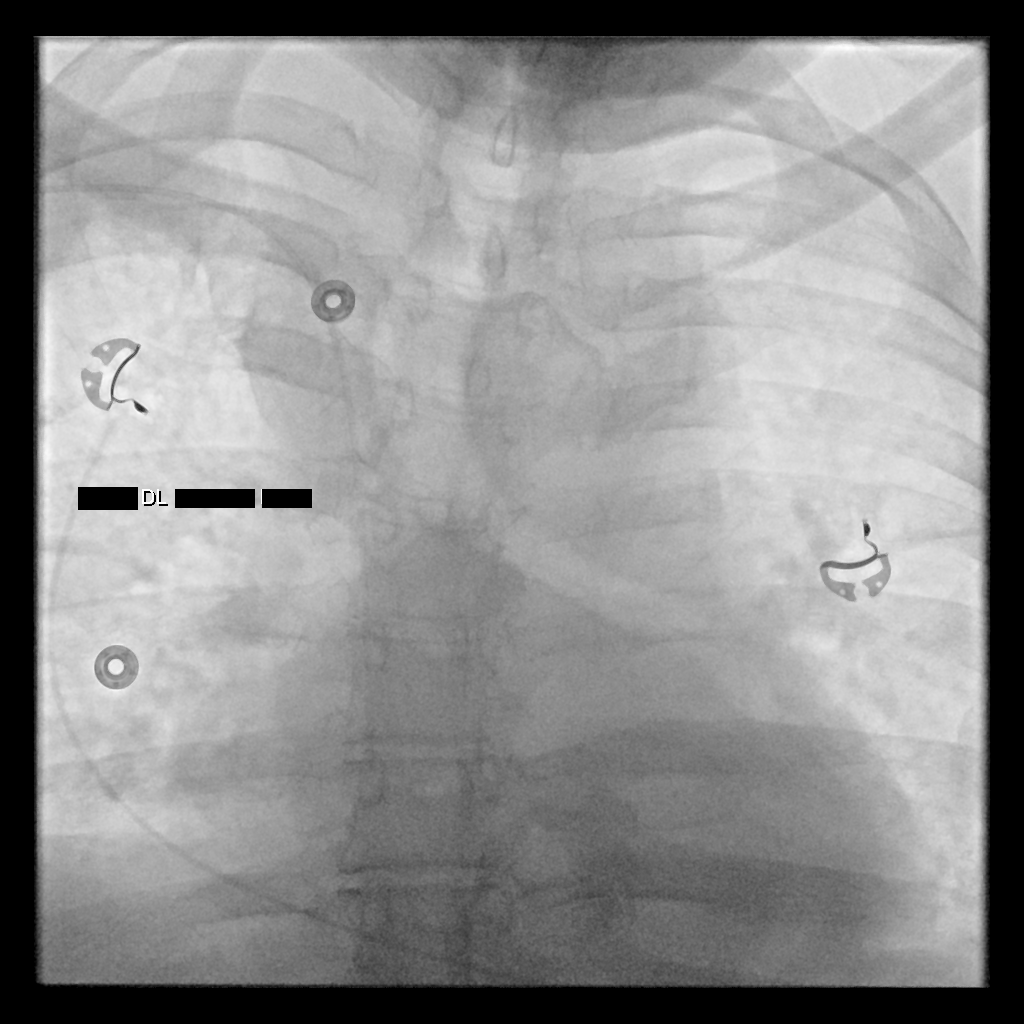
[im 2/2]
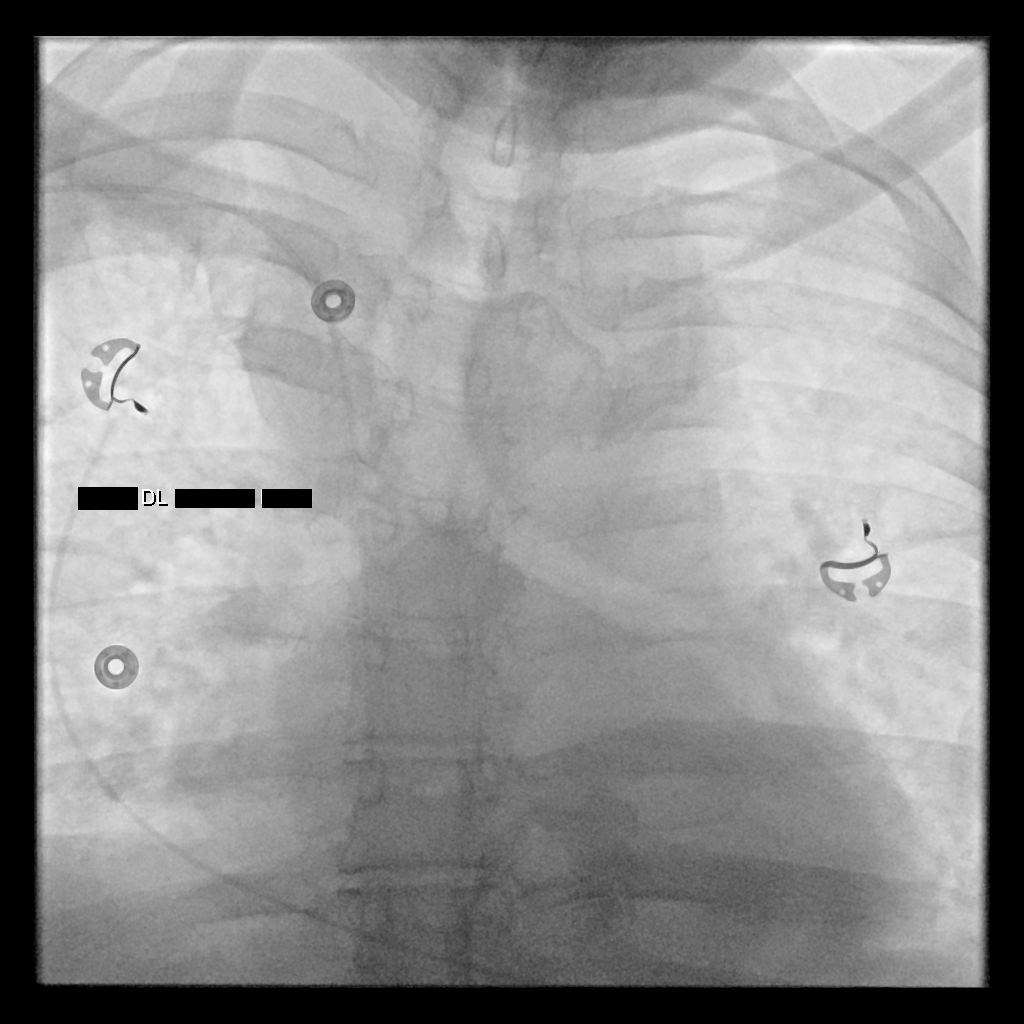

[2 of 2 positions shown; findings below may reference images not displayed]

IMPRESSION: 1. Technically successful five French double lumen power injectable
PICC placement

## 2018-04-02 DIAGNOSIS — L89323 Pressure ulcer of left buttock, stage 3: Secondary | ICD-10-CM | POA: Diagnosis not present

## 2018-04-02 DIAGNOSIS — L89154 Pressure ulcer of sacral region, stage 4: Secondary | ICD-10-CM | POA: Diagnosis not present

## 2018-04-02 DIAGNOSIS — L89214 Pressure ulcer of right hip, stage 4: Secondary | ICD-10-CM | POA: Diagnosis not present

## 2018-04-02 DIAGNOSIS — G4730 Sleep apnea, unspecified: Secondary | ICD-10-CM | POA: Diagnosis not present

## 2018-04-02 DIAGNOSIS — L89890 Pressure ulcer of other site, unstageable: Secondary | ICD-10-CM | POA: Diagnosis not present

## 2018-04-02 DIAGNOSIS — L89224 Pressure ulcer of left hip, stage 4: Secondary | ICD-10-CM | POA: Diagnosis not present

## 2018-04-02 DIAGNOSIS — I10 Essential (primary) hypertension: Secondary | ICD-10-CM | POA: Diagnosis not present

## 2018-04-02 DIAGNOSIS — L89894 Pressure ulcer of other site, stage 4: Secondary | ICD-10-CM | POA: Diagnosis not present

## 2018-04-02 DIAGNOSIS — G8250 Quadriplegia, unspecified: Secondary | ICD-10-CM | POA: Diagnosis not present

## 2018-04-02 DIAGNOSIS — L97822 Non-pressure chronic ulcer of other part of left lower leg with fat layer exposed: Secondary | ICD-10-CM | POA: Diagnosis not present

## 2018-04-03 ENCOUNTER — Observation Stay (HOSPITAL_COMMUNITY)
Admission: EM | Admit: 2018-04-03 | Discharge: 2018-04-04 | Disposition: A | Discharge status: Home / Self Care | Payer: Medicare Other | Attending: Internal Medicine | Admitting: Internal Medicine

## 2018-04-03 ENCOUNTER — Emergency Department (HOSPITAL_COMMUNITY): Payer: Medicare Other

## 2018-04-03 ENCOUNTER — Other Ambulatory Visit: Payer: Self-pay

## 2018-04-03 ENCOUNTER — Encounter (HOSPITAL_COMMUNITY): Payer: Self-pay

## 2018-04-03 DIAGNOSIS — Z6832 Body mass index (BMI) 32.0-32.9, adult: Secondary | ICD-10-CM | POA: Diagnosis not present

## 2018-04-03 DIAGNOSIS — Z993 Dependence on wheelchair: Secondary | ICD-10-CM | POA: Diagnosis not present

## 2018-04-03 DIAGNOSIS — Z8719 Personal history of other diseases of the digestive system: Secondary | ICD-10-CM | POA: Insufficient documentation

## 2018-04-03 DIAGNOSIS — R0902 Hypoxemia: Secondary | ICD-10-CM | POA: Diagnosis not present

## 2018-04-03 DIAGNOSIS — E43 Unspecified severe protein-calorie malnutrition: Secondary | ICD-10-CM | POA: Diagnosis not present

## 2018-04-03 DIAGNOSIS — Z881 Allergy status to other antibiotic agents status: Secondary | ICD-10-CM | POA: Insufficient documentation

## 2018-04-03 DIAGNOSIS — K3184 Gastroparesis: Secondary | ICD-10-CM | POA: Diagnosis present

## 2018-04-03 DIAGNOSIS — K920 Hematemesis: Principal | ICD-10-CM | POA: Diagnosis present

## 2018-04-03 DIAGNOSIS — Z9989 Dependence on other enabling machines and devices: Secondary | ICD-10-CM

## 2018-04-03 DIAGNOSIS — M4624 Osteomyelitis of vertebra, thoracic region: Secondary | ICD-10-CM | POA: Diagnosis not present

## 2018-04-03 DIAGNOSIS — Z933 Colostomy status: Secondary | ICD-10-CM | POA: Insufficient documentation

## 2018-04-03 DIAGNOSIS — G8250 Quadriplegia, unspecified: Secondary | ICD-10-CM

## 2018-04-03 DIAGNOSIS — R1084 Generalized abdominal pain: Secondary | ICD-10-CM | POA: Diagnosis not present

## 2018-04-03 DIAGNOSIS — R109 Unspecified abdominal pain: Secondary | ICD-10-CM | POA: Diagnosis not present

## 2018-04-03 DIAGNOSIS — Z79899 Other long term (current) drug therapy: Secondary | ICD-10-CM | POA: Insufficient documentation

## 2018-04-03 DIAGNOSIS — K219 Gastro-esophageal reflux disease without esophagitis: Secondary | ICD-10-CM | POA: Insufficient documentation

## 2018-04-03 DIAGNOSIS — L89154 Pressure ulcer of sacral region, stage 4: Secondary | ICD-10-CM | POA: Diagnosis present

## 2018-04-03 DIAGNOSIS — G4733 Obstructive sleep apnea (adult) (pediatric): Secondary | ICD-10-CM | POA: Diagnosis not present

## 2018-04-03 DIAGNOSIS — K922 Gastrointestinal hemorrhage, unspecified: Secondary | ICD-10-CM | POA: Diagnosis not present

## 2018-04-03 DIAGNOSIS — G8253 Quadriplegia, C5-C7 complete: Secondary | ICD-10-CM | POA: Insufficient documentation

## 2018-04-03 DIAGNOSIS — R110 Nausea: Secondary | ICD-10-CM | POA: Diagnosis not present

## 2018-04-03 DIAGNOSIS — D638 Anemia in other chronic diseases classified elsewhere: Secondary | ICD-10-CM | POA: Diagnosis present

## 2018-04-03 DIAGNOSIS — I10 Essential (primary) hypertension: Secondary | ICD-10-CM | POA: Insufficient documentation

## 2018-04-03 DIAGNOSIS — R52 Pain, unspecified: Secondary | ICD-10-CM | POA: Diagnosis not present

## 2018-04-03 LAB — CBC
HCT: 32 % — ABNORMAL LOW (ref 39.0–52.0)
HEMATOCRIT: 33.1 % — AB (ref 39.0–52.0)
HEMOGLOBIN: 9.7 g/dL — AB (ref 13.0–17.0)
Hemoglobin: 9.5 g/dL — ABNORMAL LOW (ref 13.0–17.0)
MCH: 25.8 pg — ABNORMAL LOW (ref 26.0–34.0)
MCH: 26.1 pg (ref 26.0–34.0)
MCHC: 29.3 g/dL — ABNORMAL LOW (ref 30.0–36.0)
MCHC: 29.7 g/dL — ABNORMAL LOW (ref 30.0–36.0)
MCV: 88 fL (ref 80.0–100.0)
MCV: 87.9 fL (ref 80.0–100.0)
PLATELETS: 323 10*3/uL (ref 150–400)
PLATELETS: 318 10*3/uL (ref 150–400)
RBC: 3.76 MIL/uL — AB (ref 4.22–5.81)
RBC: 3.64 MIL/uL — AB (ref 4.22–5.81)
RDW: 16.1 % — ABNORMAL HIGH (ref 11.5–15.5)
RDW: 16 % — ABNORMAL HIGH (ref 11.5–15.5)
WBC: 14.4 10*3/uL — ABNORMAL HIGH (ref 4.0–10.5)
WBC: 12.9 10*3/uL — ABNORMAL HIGH (ref 4.0–10.5)
nRBC: 0 % (ref 0.0–0.2)
nRBC: 0 % (ref 0.0–0.2)

## 2018-04-03 LAB — COMPREHENSIVE METABOLIC PANEL
ALT: 14 U/L (ref 0–44)
AST: 16 U/L (ref 15–41)
Albumin: 3 g/dL — ABNORMAL LOW (ref 3.5–5.0)
Alkaline Phosphatase: 71 U/L (ref 38–126)
Anion gap: 7 (ref 5–15)
BUN: 38 mg/dL — ABNORMAL HIGH (ref 6–20)
CHLORIDE: 110 mmol/L (ref 98–111)
CO2: 24 mmol/L (ref 22–32)
CREATININE: 0.5 mg/dL — AB (ref 0.61–1.24)
Calcium: 8.8 mg/dL — ABNORMAL LOW (ref 8.9–10.3)
GFR calc Af Amer: >60 mL/min (ref >60)
Glucose, Bld: 126 mg/dL — ABNORMAL HIGH (ref 70–99)
Potassium: 4.3 mmol/L (ref 3.5–5.1)
Sodium: 141 mmol/L (ref 135–145)
Total Bilirubin: 0.3 mg/dL (ref 0.3–1.2)
Total Protein: 7.3 g/dL (ref 6.5–8.1)

## 2018-04-03 LAB — TYPE AND SCREEN
ABO/RH(D): B POS
ANTIBODY SCREEN: NEGATIVE

## 2018-04-03 LAB — LIPASE, BLOOD: LIPASE: 29 U/L (ref 11–51)

## 2018-04-03 LAB — OCCULT BLOOD GASTRIC / DUODENUM (SPECIMEN CUP)
Occult Blood, Gastric: NEGATIVE
pH, Gastric: 3

## 2018-04-03 MED ORDER — SODIUM CHLORIDE 0.9 % IV SOLN
8.0000 mg/h | INTRAVENOUS | Status: DC
  Administered 2018-04-03 (×2): 8 mg/h via INTRAVENOUS
  Filled 2018-04-03 (×3): qty 80

## 2018-04-03 MED ORDER — ACETAMINOPHEN 650 MG RE SUPP: 650.0000 mg | Freq: Four times a day (QID) | RECTAL | Status: DC | PRN

## 2018-04-03 MED ORDER — MORPHINE SULFATE (PF) 4 MG/ML IV SOLN
4.0000 mg | Freq: Once | INTRAVENOUS | Status: AC
Start: 2018-04-03 — End: 2018-04-03
  Administered 2018-04-03: 4 mg via INTRAVENOUS
  Filled 2018-04-03: qty 1

## 2018-04-03 MED ORDER — ONDANSETRON HCL 4 MG PO TABS
4.0000 mg | ORAL_TABLET | Freq: Four times a day (QID) | ORAL | Status: DC | PRN
  Administered 2018-04-03: 4 mg via ORAL
  Filled 2018-04-03: qty 1

## 2018-04-03 MED ORDER — SUCRALFATE 1 G PO TABS
1.0000 g | ORAL_TABLET | Freq: Four times a day (QID) | ORAL | Status: DC
  Administered 2018-04-03 – 2018-04-04 (×6): 1 g via ORAL
  Filled 2018-04-03 (×6): qty 1

## 2018-04-03 MED ORDER — ONDANSETRON HCL 4 MG/2ML IJ SOLN
4.0000 mg | Freq: Once | INTRAMUSCULAR | Status: AC
  Administered 2018-04-03: 4 mg via INTRAVENOUS
  Filled 2018-04-03: qty 2

## 2018-04-03 MED ORDER — BACLOFEN 20 MG PO TABS
20.0000 mg | ORAL_TABLET | Freq: Four times a day (QID) | ORAL | Status: DC
  Administered 2018-04-03 – 2018-04-04 (×6): 20 mg via ORAL
  Filled 2018-04-03 (×6): qty 1

## 2018-04-03 MED ORDER — SODIUM CHLORIDE 0.9 % IV BOLUS
500.0000 mL | Freq: Once | INTRAVENOUS | Status: AC
  Administered 2018-04-03: 500 mL via INTRAVENOUS

## 2018-04-03 MED ORDER — METOCLOPRAMIDE HCL 5 MG/ML IJ SOLN
10.0000 mg | Freq: Once | INTRAMUSCULAR | Status: AC
Start: 2018-04-03 — End: 2018-04-03
  Administered 2018-04-03: 10 mg via INTRAVENOUS
  Filled 2018-04-03: qty 2

## 2018-04-03 MED ORDER — ONDANSETRON HCL 4 MG/2ML IJ SOLN: 4.0000 mg | Freq: Four times a day (QID) | INTRAMUSCULAR | Status: DC | PRN

## 2018-04-03 MED ORDER — FESOTERODINE FUMARATE ER 8 MG PO TB24
8.0000 mg | ORAL_TABLET | Freq: Every day | ORAL | Status: DC
  Administered 2018-04-03 – 2018-04-04 (×2): 8 mg via ORAL
  Filled 2018-04-03 (×2): qty 1

## 2018-04-03 MED ORDER — ONDANSETRON 8 MG PO TBDP: 8.0000 mg | ORAL_TABLET | Freq: Three times a day (TID) | ORAL | 0 refills | Status: DC | PRN

## 2018-04-03 MED ORDER — PANTOPRAZOLE SODIUM 40 MG IV SOLR: 40.0000 mg | Freq: Two times a day (BID) | INTRAVENOUS | Status: DC

## 2018-04-03 MED ORDER — SODIUM CHLORIDE 0.9 % IV SOLN
80.0000 mg | Freq: Once | INTRAVENOUS | Status: AC
  Administered 2018-04-03: 80 mg via INTRAVENOUS
  Filled 2018-04-03: qty 80

## 2018-04-03 MED ORDER — SODIUM CHLORIDE 0.9 % IV BOLUS
1000.0000 mL | Freq: Once | INTRAVENOUS | Status: AC
  Administered 2018-04-03: 1000 mL via INTRAVENOUS

## 2018-04-03 MED ORDER — METOCLOPRAMIDE HCL 10 MG PO TABS
10.0000 mg | ORAL_TABLET | Freq: Four times a day (QID) | ORAL | Status: DC
  Administered 2018-04-03 – 2018-04-04 (×5): 10 mg via ORAL
  Filled 2018-04-03 (×5): qty 1

## 2018-04-03 MED ORDER — IOPAMIDOL (ISOVUE-300) INJECTION 61%
100.0000 mL | Freq: Once | INTRAVENOUS | Status: AC | PRN
  Administered 2018-04-03: 100 mL via INTRAVENOUS

## 2018-04-03 MED ORDER — SODIUM CHLORIDE 0.9 % IV SOLN
INTRAVENOUS | Status: DC
  Administered 2018-04-03: 17:00:00 via INTRAVENOUS

## 2018-04-03 MED ORDER — FERROUS SULFATE 325 (65 FE) MG PO TABS
325.0000 mg | ORAL_TABLET | Freq: Three times a day (TID) | ORAL | Status: DC
  Administered 2018-04-03 – 2018-04-04 (×4): 325 mg via ORAL
  Filled 2018-04-03 (×4): qty 1

## 2018-04-03 MED ORDER — SODIUM CHLORIDE (PF) 0.9 % IJ SOLN
INTRAMUSCULAR | Status: AC
  Filled 2018-04-03: qty 50

## 2018-04-03 MED ORDER — SODIUM CHLORIDE 0.9% FLUSH
3.0000 mL | Freq: Two times a day (BID) | INTRAVENOUS | Status: DC
  Administered 2018-04-04: 3 mL via INTRAVENOUS

## 2018-04-03 MED ORDER — IOPAMIDOL (ISOVUE-300) INJECTION 61%
INTRAVENOUS | Status: AC
  Filled 2018-04-03: qty 100

## 2018-04-03 MED ORDER — ACETAMINOPHEN 325 MG PO TABS: 650.0000 mg | ORAL_TABLET | Freq: Four times a day (QID) | ORAL | Status: DC | PRN

## 2018-04-03 MED ORDER — POLYETHYLENE GLYCOL 3350 17 G PO PACK: 17.0000 g | PACK | Freq: Every day | ORAL | Status: DC | PRN

## 2018-04-03 NOTE — ED Notes (Signed)
Zvi NT walked gastric sample to lab

## 2018-04-03 NOTE — ED Notes (Signed)
Attempted to call report for third time. RN unavailable.

## 2018-04-03 NOTE — ED Notes (Signed)
Attempted to call report; RN unavailable at this time.   
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
## 2018-04-03 NOTE — ED Notes (Signed)
Pt called out stating that he was going to vomit. Upon going into room pt begins to vomit coffee ground emesis. Campos MD made aware of pts emesis. MD to bedside.

## 2018-04-03 NOTE — ED Notes (Signed)
Pt reports feeling bloated at this time. Pain has decreased

## 2018-04-03 NOTE — ED Notes (Signed)
ED Provider at bedside. 

## 2018-04-03 NOTE — ED Triage Notes (Signed)
Pt arrives via EMS from home. Pt is a quadriplegic and has a home health aid that helps care for him. Pt reports abd pain on the left side since 1500 yesterday. Pt reports nausea, denies vomiting and diarrhea. Pt denies fevers. Pt has foley in place from home.

## 2018-04-03 NOTE — ED Notes (Signed)
Bed: WA17 Expected date:  Expected time:  Means of arrival:  Comments: EMS-abdominal pain

## 2018-04-03 NOTE — ED Notes (Signed)
Patient transported to CT 

## 2018-04-03 NOTE — Progress Notes (Signed)
Mattress replaced per order.  SCDs in place.  Patient resting in bed.
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
## 2018-04-03 NOTE — Progress Notes (Signed)
   04/03/18 1929  Vitals  BP (!) 78/45  MAP (mmHg) (!) 54  BP Location Left Arm  BP Method Automatic  Patient Position (if appropriate) Lying  Pulse Rate 92  Pulse Rate Source Monitor  Oxygen Therapy  SpO2 97 %  O2 Device Room Air  Patient denies dizziness.  Voices no complaints.  Reports feeling same as he has been feeling.  Mid-level notified via text page.  Night RN notified.

## 2018-04-03 NOTE — Progress Notes (Addendum)
Patient arrived to unit from ED.  Alert and oriented.  Telemetry applied and called to CCMD.  VS obtained.  Patient oriented to room.  Dr. Maryland Pink notified of patient's arrival to room.  No diet ordered - patient requesting water to drink.  Dr. Maryland Pink paged for diet orders.  Wounds to right upper back, sacrum/buttocks, left hip - all with dressings intact.  Dr. Maryland Pink notified for possible [place] consult.  CTI to right heel/plantar aspect of foot.

## 2018-04-03 NOTE — Progress Notes (Signed)
Per patient's request, this RT will return around midnight to assist pt with cpap.
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
## 2018-04-04 ENCOUNTER — Other Ambulatory Visit: Payer: Self-pay

## 2018-04-04 DIAGNOSIS — R109 Unspecified abdominal pain: Secondary | ICD-10-CM

## 2018-04-04 DIAGNOSIS — G4733 Obstructive sleep apnea (adult) (pediatric): Secondary | ICD-10-CM | POA: Diagnosis not present

## 2018-04-04 DIAGNOSIS — M2550 Pain in unspecified joint: Secondary | ICD-10-CM | POA: Diagnosis not present

## 2018-04-04 DIAGNOSIS — Z7401 Bed confinement status: Secondary | ICD-10-CM | POA: Diagnosis not present

## 2018-04-04 DIAGNOSIS — K920 Hematemesis: Secondary | ICD-10-CM | POA: Diagnosis not present

## 2018-04-04 DIAGNOSIS — R1084 Generalized abdominal pain: Secondary | ICD-10-CM | POA: Diagnosis not present

## 2018-04-04 DIAGNOSIS — R52 Pain, unspecified: Secondary | ICD-10-CM | POA: Diagnosis not present

## 2018-04-04 DIAGNOSIS — K3184 Gastroparesis: Secondary | ICD-10-CM | POA: Diagnosis not present

## 2018-04-04 LAB — CBC
HCT: 29.1 % — ABNORMAL LOW (ref 39.0–52.0)
HCT: 30.2 % — ABNORMAL LOW (ref 39.0–52.0)
HEMOGLOBIN: 8.3 g/dL — AB (ref 13.0–17.0)
Hemoglobin: 8.8 g/dL — ABNORMAL LOW (ref 13.0–17.0)
MCH: 25.6 pg — AB (ref 26.0–34.0)
MCH: 25.4 pg — ABNORMAL LOW (ref 26.0–34.0)
MCHC: 28.5 g/dL — ABNORMAL LOW (ref 30.0–36.0)
MCHC: 29.1 g/dL — ABNORMAL LOW (ref 30.0–36.0)
MCV: 89.8 fL (ref 80.0–100.0)
MCV: 87.3 fL (ref 80.0–100.0)
NRBC: 0 % (ref 0.0–0.2)
PLATELETS: 263 10*3/uL (ref 150–400)
Platelets: 288 10*3/uL (ref 150–400)
RBC: 3.24 MIL/uL — AB (ref 4.22–5.81)
RBC: 3.46 MIL/uL — ABNORMAL LOW (ref 4.22–5.81)
RDW: 16.3 % — ABNORMAL HIGH (ref 11.5–15.5)
RDW: 16.3 % — ABNORMAL HIGH (ref 11.5–15.5)
WBC: 10.9 10*3/uL — ABNORMAL HIGH (ref 4.0–10.5)
WBC: 9.4 10*3/uL (ref 4.0–10.5)
nRBC: 0 % (ref 0.0–0.2)

## 2018-04-04 LAB — BASIC METABOLIC PANEL
Anion gap: 4 — ABNORMAL LOW (ref 5–15)
BUN: 29 mg/dL — AB (ref 6–20)
CO2: 22 mmol/L (ref 22–32)
CREATININE: 0.31 mg/dL — AB (ref 0.61–1.24)
Calcium: 8.2 mg/dL — ABNORMAL LOW (ref 8.9–10.3)
Chloride: 115 mmol/L — ABNORMAL HIGH (ref 98–111)
GFR calc Af Amer: >60 mL/min (ref >60)
GLUCOSE: 89 mg/dL (ref 70–99)
Potassium: 3.7 mmol/L (ref 3.5–5.1)
SODIUM: 141 mmol/L (ref 135–145)

## 2018-04-04 MED ORDER — JUVEN PO PACK: 1.0000 | PACK | Freq: Two times a day (BID) | ORAL | 0 refills | Status: DC

## 2018-04-04 MED ORDER — SODIUM CHLORIDE 0.9 % IV SOLN
80.0000 mg | Freq: Once | INTRAVENOUS | Status: DC
  Filled 2018-04-04: qty 80

## 2018-04-04 MED ORDER — POLYETHYLENE GLYCOL 3350 17 G PO PACK: 17.0000 g | PACK | Freq: Every day | ORAL | 0 refills | Status: DC | PRN

## 2018-04-04 MED ORDER — SODIUM CHLORIDE 0.9% FLUSH: 10.0000 mL | INTRAVENOUS | Status: DC | PRN

## 2018-04-04 MED ORDER — HEPARIN SOD (PORK) LOCK FLUSH 100 UNIT/ML IV SOLN
500.0000 [IU] | INTRAVENOUS | Status: AC | PRN
  Administered 2018-04-04: 500 [IU]

## 2018-04-04 MED ORDER — PANTOPRAZOLE SODIUM 40 MG PO TBEC
40.0000 mg | DELAYED_RELEASE_TABLET | Freq: Two times a day (BID) | ORAL | Status: DC
  Administered 2018-04-04: 40 mg via ORAL
  Filled 2018-04-04: qty 1

## 2018-04-04 MED ORDER — SODIUM CHLORIDE 0.9% FLUSH
10.0000 mL | Freq: Two times a day (BID) | INTRAVENOUS | Status: DC
  Administered 2018-04-04: 10 mL

## 2018-04-04 MED ORDER — JUVEN PO PACK
1.0000 | PACK | Freq: Two times a day (BID) | ORAL | Status: DC
  Administered 2018-04-04: 1 via ORAL
  Filled 2018-04-04: qty 1

## 2018-04-04 NOTE — Plan of Care (Signed)
Plan of care reviewed. 

## 2018-04-04 NOTE — Discharge Summary (Signed)
Physician Discharge Summary  Patient ID: [name] [name] MRN: [id number] DOB/AGE: 11-26-1965 52 y.o.  Admit date: 04/03/2018 Discharge date: 04/04/2018  Admission Diagnoses:  Discharge Diagnoses:  Principal Problem:   Hematemesis Active Problems:   Quadriplegia ([place])   Sacral decubitus ulcer, stage IV ([name]   OSA on CPAP   Severe protein-calorie malnutrition ([name]   Anemia of chronic disease   Osteomyelitis of thoracic region [facility])   Gastroparesis   Discharged Condition: stable  Hospital Course:  [name] [name] is a 52 year old male, functional quadriplegia, with past medical history significant for quadriplegia with chronic decubitus ulcers/chronic osteomyelitis and obstructive sleep apnea.  Patient was admitted with 1 day history of generalized abdominal pain and persistent nausea.  In the emergency room, patient was noted to have a white count of 14.4 and hemoglobin of 9.7.  No acute findings were noted and his abdominal CT other than some gaseous distention.  Initial plan was to discharge patient from the emergency room, but then patient had a large episode of hematemesis which was witnessed.  Patient's last hospitalization was 4 months ago when at that time, patient presented similarly with hematemesis and with anemia requiring 3 units of blood transfused.  An EGD done at that time noted erosive esophagitis and gastritis.  Following hematemesis, hemoglobin was checked and remained stable although his pressures have been slightly soft.  Patient was admitted for further assessment and management.   During the hospital stay, patient's hemoglobin and hematocrit were monitored, and they have remained stable.  Hemoglobin prior to discharge was 8.8 g/dL.  Patient was also managed with IV Protonix on Carafate.  No further hematemesis.  Patient will be discharged back come to the care of the primary care provider.  Patient will follow with the GI team on discharge.  Consults: None  Significant  Diagnostic Studies: labs: Stable hemoglobin and hematocrit.  Hemoglobin prior to discharge was 8.8 g/dL.  Discharge Exam: Blood pressure 98/72, pulse 98, temperature 98.7 F (37.1 C), temperature source Oral, resp. rate 18, height 5\' 7"  (1.702 m), weight 95.3 kg, SpO2 95 %.   Disposition: Discharge disposition: 01-Home or Self Care   Discharge Instructions    Diet - low sodium heart healthy   Complete by:  As directed    Increase activity slowly   Complete by:  As directed      Allergies as of 04/04/2018      Reactions   Feraheme [ferumoxytol] Other (See Comments)   SYNCOPE   Ditropan [oxybutynin] Other (See Comments)   Hallucinations    Vancomycin Other (See Comments)   ARF 05-2016 -- affects kidneys      Medication List    STOP taking these medications   amLODipine 2.5 MG tablet Commonly known as:  NORVASC   furosemide 20 MG tablet Commonly known as:  LASIX   ondansetron 4 MG tablet Commonly known as:  ZOFRAN   potassium chloride SA 20 MEQ tablet Commonly known as:  K-DUR,KLOR-CON     TAKE these medications   baclofen 20 MG tablet Commonly known as:  LIORESAL Take 20 mg by mouth 4 (four) times daily.   ferrous sulfate 325 (65 FE) MG tablet TAKE 1 TABLET (325 MG TOTAL) BY MOUTH 3 (THREE) TIMES DAILY WITH MEALS. What changed:  See the new instructions.   metoCLOPramide 10 MG tablet Commonly known as:  REGLAN Take 1 tablet (10 mg total) by mouth every 6 (six) hours.   multivitamin with minerals Tabs tablet Take 1 tablet  by mouth every morning.   nutrition supplement (JUVEN) Pack Take 1 packet by mouth 2 (two) times daily between meals.   ondansetron 8 MG disintegrating tablet Commonly known as:  ZOFRAN-ODT Take 1 tablet (8 mg total) by mouth every 8 (eight) hours as needed for nausea or vomiting.   pantoprazole 40 MG tablet Commonly known as:  PROTONIX Take 1 tablet by mouth 2 (two) times daily.   polyethylene glycol packet Commonly known as:   MIRALAX / GLYCOLAX Take 17 g by mouth daily as needed for mild constipation.   sucralfate 1 g tablet Commonly known as:  CARAFATE TAKE 1 TABLET BY MOUTH 4 TIMES A DAY   TOVIAZ 8 MG Tb24 tablet Generic drug:  fesoterodine Take 8 mg by mouth daily.   vitamin C 500 MG tablet Commonly known as:  ASCORBIC ACID Take 500 mg by mouth daily.   Zinc 50 MG Tabs Take 50 mg by mouth 2 (two) times daily.      Follow-up Information    [name], MD.   Specialty:  Internal Medicine Why:  Please call your doctor for follow up in 2-3 days Contact information: [address] [place] [address] [phone]        [facility] DEPT.   Specialty:  Emergency Medicine Why:  Return to ER for any new or worsening symptoms Contact information: [place] [phone] [place] [phone]          Signed: Bonnell Public 04/04/2018, 2:46 PM

## 2018-04-04 NOTE — Care Management Note (Signed)
Case Management Note  Patient Details  Name: [name] [name] MRN: [id number] Date of Birth: 1965/11/16  Subjective/Objective:  Pt admitted with Hematemesis                  Action/Plan: Plan to discharge home with sister and [place].   Expected Discharge Date:   04/04/2018              Expected Discharge Plan:  [place]  In-House Referral:     Discharge planning Services  CM Consult  Post Acute Care Choice:    Choice offered to:  Patient  DME Arranged:    DME Agency:     HH Arranged:  RN [place] Agency:  [place]  Status of Service:  Completed, signed off  If discussed at [place] of Stay Meetings, dates discussed:    Additional CommentsPurcell Mouton, RN 04/04/2018, 2:07 PM

## 2018-04-04 NOTE — Progress Notes (Signed)
Pt states his states will not be home until after 6 PM. [facility] was called for a 7 PM pickup to transport home. If this time changes please call [facility] at [phone].

## 2018-04-04 NOTE — Progress Notes (Signed)
Initial Nutrition Assessment  DOCUMENTATION CODES:   Obesity unspecified  INTERVENTION:    Juven Fruit Punch BID, each serving provides 95kcal and 2.5g of protein (amino acids glutamine and arginine)  NUTRITION DIAGNOSIS:   Increased nutrient needs related to wound healing as evidenced by estimated needs.  GOAL:   Patient will meet greater than or equal to 90% of their needs  MONITOR:   PO intake, Supplement acceptance, Labs, I & O's, Weight trends  REASON FOR ASSESSMENT:   Consult Assessment of nutrition requirement/status  ASSESSMENT:   Patient with PMH significant for quadriplegia with chronic decubitus ulcers/chronic osteomyelitis, HTN, GERD, gastritis, erosive esophagitis, and seizures. Presents this admission generalized abdominal pain and persistent nausea.    Pt reports having a great appetite up until 5 days ago when his nausea increased. States he typically eats three meals per day without any complication but during this time period he couldn't tolerate anything PO. Pt drinks Juven BID at home and would like to continue them here. Pt recently on a clear liquid diet. He tolerated juice this am without resulting nasuea. Meal completion for lunch charted as 50%. Diet advanced to regular this afternoon.   UBW stays around 200 lb. Pt denies any recent wt loss. Records show pt weighed 211 lb 05/03/17 and 210 lb this admission.   Medications reviewed and include: ferrous sulfate, reglan QID, NS @ 100 ml/hr Labs reviewed.   NUTRITION - FOCUSED PHYSICAL EXAM:  Not applicable given quadriplegia.   Diet Order:   Diet Order            Diet regular Room service appropriate? Yes; Fluid consistency: Thin  Diet effective now              EDUCATION NEEDS:   Education needs have been addressed  Skin:  Skin Assessment: Skin Integrity Issues: Skin Integrity Issues:: Stage IV Stage IV: sacrum, ischial tuberosity  Last BM:  04/03/18  Height:   Ht Readings from  Last 1 Encounters:  04/03/18 5\' 7"  (1.702 m)    Weight:   Wt Readings from Last 1 Encounters:  04/03/18 95.3 kg    Ideal Body Weight:  67.3 kg  BMI:  Body mass index is 32.89 kg/m.  Estimated Nutritional Needs:   Kcal:  2100-2300 kcal  Protein:  105-120 grams  Fluid:  >/= 2.1 L/day   Mariana Single RD, LDN Clinical Nutrition Pager # - [phone]

## 2018-04-09 DIAGNOSIS — L89104 Pressure ulcer of unspecified part of back, stage 4: Secondary | ICD-10-CM | POA: Diagnosis not present

## 2018-04-09 DIAGNOSIS — L89154 Pressure ulcer of sacral region, stage 4: Secondary | ICD-10-CM | POA: Diagnosis not present

## 2018-04-09 DIAGNOSIS — L97513 Non-pressure chronic ulcer of other part of right foot with necrosis of muscle: Secondary | ICD-10-CM | POA: Diagnosis not present

## 2018-04-09 DIAGNOSIS — L89613 Pressure ulcer of right heel, stage 3: Secondary | ICD-10-CM | POA: Diagnosis not present

## 2018-04-11 ENCOUNTER — Other Ambulatory Visit: Payer: Self-pay | Admitting: Nurse Practitioner

## 2018-04-12 ENCOUNTER — Other Ambulatory Visit: Payer: Self-pay

## 2018-04-12 ENCOUNTER — Emergency Department (HOSPITAL_COMMUNITY): Payer: Medicare Other

## 2018-04-12 ENCOUNTER — Observation Stay (HOSPITAL_COMMUNITY)
Admission: EM | Admit: 2018-04-12 | Discharge: 2018-04-13 | Disposition: A | Discharge status: Home / Self Care | Payer: Medicare Other | Attending: Family Medicine | Admitting: Family Medicine

## 2018-04-12 ENCOUNTER — Encounter (HOSPITAL_COMMUNITY): Payer: Self-pay

## 2018-04-12 DIAGNOSIS — I10 Essential (primary) hypertension: Secondary | ICD-10-CM | POA: Insufficient documentation

## 2018-04-12 DIAGNOSIS — R05 Cough: Secondary | ICD-10-CM | POA: Diagnosis not present

## 2018-04-12 DIAGNOSIS — Z79899 Other long term (current) drug therapy: Secondary | ICD-10-CM | POA: Diagnosis not present

## 2018-04-12 DIAGNOSIS — Z939 Artificial opening status, unspecified: Secondary | ICD-10-CM | POA: Insufficient documentation

## 2018-04-12 DIAGNOSIS — R0902 Hypoxemia: Secondary | ICD-10-CM | POA: Diagnosis not present

## 2018-04-12 DIAGNOSIS — J181 Lobar pneumonia, unspecified organism: Secondary | ICD-10-CM

## 2018-04-12 DIAGNOSIS — J168 Pneumonia due to other specified infectious organisms: Secondary | ICD-10-CM | POA: Diagnosis not present

## 2018-04-12 DIAGNOSIS — N3000 Acute cystitis without hematuria: Secondary | ICD-10-CM | POA: Diagnosis not present

## 2018-04-12 DIAGNOSIS — J189 Pneumonia, unspecified organism: Secondary | ICD-10-CM | POA: Diagnosis not present

## 2018-04-12 DIAGNOSIS — Z95828 Presence of other vascular implants and grafts: Secondary | ICD-10-CM | POA: Insufficient documentation

## 2018-04-12 DIAGNOSIS — I959 Hypotension, unspecified: Secondary | ICD-10-CM | POA: Diagnosis not present

## 2018-04-12 DIAGNOSIS — R0689 Other abnormalities of breathing: Secondary | ICD-10-CM | POA: Diagnosis not present

## 2018-04-12 DIAGNOSIS — R509 Fever, unspecified: Secondary | ICD-10-CM | POA: Diagnosis present

## 2018-04-12 DIAGNOSIS — R000 Tachycardia, unspecified: Secondary | ICD-10-CM | POA: Diagnosis not present

## 2018-04-12 LAB — COMPREHENSIVE METABOLIC PANEL
ALT: 13 U/L (ref 0–44)
AST: 14 U/L — ABNORMAL LOW (ref 15–41)
Albumin: 3.1 g/dL — ABNORMAL LOW (ref 3.5–5.0)
Alkaline Phosphatase: 64 U/L (ref 38–126)
Anion gap: 6 (ref 5–15)
BUN: 21 mg/dL — ABNORMAL HIGH (ref 6–20)
CHLORIDE: 106 mmol/L (ref 98–111)
CO2: 25 mmol/L (ref 22–32)
Calcium: 8.4 mg/dL — ABNORMAL LOW (ref 8.9–10.3)
Creatinine, Ser: 0.49 mg/dL — ABNORMAL LOW (ref 0.61–1.24)
GFR calc Af Amer: >60 mL/min (ref >60)
GFR calc non Af Amer: >60 mL/min (ref >60)
Glucose, Bld: 101 mg/dL — ABNORMAL HIGH (ref 70–99)
Potassium: 3.8 mmol/L (ref 3.5–5.1)
SODIUM: 137 mmol/L (ref 135–145)
Total Bilirubin: 0.9 mg/dL (ref 0.3–1.2)
Total Protein: 7.3 g/dL (ref 6.5–8.1)

## 2018-04-12 LAB — I-STAT CG4 LACTIC ACID, ED: Lactic Acid, Venous: 0.56 mmol/L (ref 0.5–1.9)

## 2018-04-12 LAB — URINALYSIS, ROUTINE W REFLEX MICROSCOPIC
Bacteria, UA: NONE SEEN
Bilirubin Urine: NEGATIVE
Glucose, UA: NEGATIVE mg/dL
Hgb urine dipstick: NEGATIVE
Ketones, ur: NEGATIVE mg/dL
Nitrite: NEGATIVE
Protein, ur: NEGATIVE mg/dL
Specific Gravity, Urine: 1.015 (ref 1.005–1.030)
pH: 6 (ref 5.0–8.0)

## 2018-04-12 LAB — CBC WITH DIFFERENTIAL/PLATELET
Abs Immature Granulocytes: 0.13 10*3/uL — ABNORMAL HIGH (ref 0.00–0.07)
BASOS ABS: 0 10*3/uL (ref 0.0–0.1)
Basophils Relative: 0 %
Eosinophils Absolute: 0 10*3/uL (ref 0.0–0.5)
Eosinophils Relative: 0 %
HCT: 29.7 % — ABNORMAL LOW (ref 39.0–52.0)
Hemoglobin: 9 g/dL — ABNORMAL LOW (ref 13.0–17.0)
Immature Granulocytes: 1 %
LYMPHS PCT: 6 %
Lymphs Abs: 1 10*3/uL (ref 0.7–4.0)
MCH: 26.2 pg (ref 26.0–34.0)
MCHC: 30.3 g/dL (ref 30.0–36.0)
MCV: 86.6 fL (ref 80.0–100.0)
Monocytes Absolute: 1.6 10*3/uL — ABNORMAL HIGH (ref 0.1–1.0)
Monocytes Relative: 8 %
NRBC: 0 % (ref 0.0–0.2)
Neutro Abs: 16.2 10*3/uL — ABNORMAL HIGH (ref 1.7–7.7)
Neutrophils Relative %: 85 %
Platelets: 273 10*3/uL (ref 150–400)
RBC: 3.43 MIL/uL — ABNORMAL LOW (ref 4.22–5.81)
RDW: 15.8 % — ABNORMAL HIGH (ref 11.5–15.5)
WBC: 19 10*3/uL — ABNORMAL HIGH (ref 4.0–10.5)

## 2018-04-12 LAB — PROTIME-INR
INR: 1.29
Prothrombin Time: 15.9 seconds — ABNORMAL HIGH (ref 11.4–15.2)

## 2018-04-12 MED ORDER — ACETAMINOPHEN 325 MG PO TABS
650.0000 mg | ORAL_TABLET | Freq: Once | ORAL | Status: AC
  Administered 2018-04-12: 650 mg via ORAL
  Filled 2018-04-12: qty 2

## 2018-04-12 MED ORDER — SUCRALFATE 1 G PO TABS
1.0000 g | ORAL_TABLET | Freq: Four times a day (QID) | ORAL | Status: DC
  Administered 2018-04-12 – 2018-04-13 (×3): 1 g via ORAL
  Filled 2018-04-12 (×3): qty 1

## 2018-04-12 MED ORDER — METOCLOPRAMIDE HCL 10 MG PO TABS
10.0000 mg | ORAL_TABLET | Freq: Three times a day (TID) | ORAL | Status: DC
  Administered 2018-04-12 – 2018-04-13 (×4): 10 mg via ORAL
  Filled 2018-04-12 (×4): qty 1

## 2018-04-12 MED ORDER — FERROUS SULFATE 325 (65 FE) MG PO TABS
325.0000 mg | ORAL_TABLET | Freq: Three times a day (TID) | ORAL | Status: DC
  Administered 2018-04-13 (×3): 325 mg via ORAL
  Filled 2018-04-12 (×3): qty 1

## 2018-04-12 MED ORDER — ACETAMINOPHEN 325 MG PO TABS: 650.0000 mg | ORAL_TABLET | Freq: Four times a day (QID) | ORAL | Status: DC | PRN

## 2018-04-12 MED ORDER — ONDANSETRON 4 MG PO TBDP
8.0000 mg | ORAL_TABLET | Freq: Three times a day (TID) | ORAL | Status: DC | PRN
  Administered 2018-04-13: 8 mg via ORAL
  Filled 2018-04-12: qty 2

## 2018-04-12 MED ORDER — SODIUM CHLORIDE 0.9 % IV SOLN
500.0000 mg | Freq: Once | INTRAVENOUS | Status: AC
  Administered 2018-04-12: 500 mg via INTRAVENOUS
  Filled 2018-04-12: qty 500

## 2018-04-12 MED ORDER — ENOXAPARIN SODIUM 40 MG/0.4ML ~~LOC~~ SOLN
40.0000 mg | SUBCUTANEOUS | Status: DC
  Administered 2018-04-12: 40 mg via SUBCUTANEOUS
  Filled 2018-04-12: qty 0.4

## 2018-04-12 MED ORDER — FESOTERODINE FUMARATE ER 8 MG PO TB24
8.0000 mg | ORAL_TABLET | Freq: Every day | ORAL | Status: DC
  Administered 2018-04-12 – 2018-04-13 (×2): 8 mg via ORAL
  Filled 2018-04-12 (×3): qty 1

## 2018-04-12 MED ORDER — PANTOPRAZOLE SODIUM 40 MG PO TBEC: 40.0000 mg | DELAYED_RELEASE_TABLET | Freq: Two times a day (BID) | ORAL | Status: DC

## 2018-04-12 MED ORDER — BACLOFEN 20 MG PO TABS
20.0000 mg | ORAL_TABLET | Freq: Four times a day (QID) | ORAL | Status: DC
  Administered 2018-04-12 – 2018-04-13 (×3): 20 mg via ORAL
  Filled 2018-04-12 (×3): qty 1

## 2018-04-12 MED ORDER — PANTOPRAZOLE SODIUM 40 MG PO TBEC
40.0000 mg | DELAYED_RELEASE_TABLET | Freq: Two times a day (BID) | ORAL | Status: DC
  Administered 2018-04-12 – 2018-04-13 (×3): 40 mg via ORAL
  Filled 2018-04-12 (×3): qty 1

## 2018-04-12 MED ORDER — SODIUM CHLORIDE 0.9 % IV SOLN
500.0000 mg | INTRAVENOUS | Status: DC
  Filled 2018-04-12: qty 500

## 2018-04-12 MED ORDER — JUVEN PO PACK
1.0000 | PACK | Freq: Two times a day (BID) | ORAL | Status: DC
  Filled 2018-04-12 (×2): qty 1

## 2018-04-12 MED ORDER — SODIUM CHLORIDE 0.9 % IV SOLN
1.0000 g | INTRAVENOUS | Status: DC
  Administered 2018-04-13: 1 g via INTRAVENOUS
  Filled 2018-04-12: qty 1

## 2018-04-12 MED ORDER — SODIUM CHLORIDE 0.9 % IV SOLN
1.0000 g | Freq: Once | INTRAVENOUS | Status: AC
  Administered 2018-04-12: 1 g via INTRAVENOUS
  Filled 2018-04-12: qty 10

## 2018-04-12 MED ORDER — ADULT MULTIVITAMIN W/MINERALS CH
1.0000 | ORAL_TABLET | Freq: Every morning | ORAL | Status: DC
  Administered 2018-04-13: 1 via ORAL
  Filled 2018-04-12: qty 1
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
## 2018-04-12 NOTE — ED Notes (Signed)
Bed: WA17 Expected date:  Expected time:  Means of arrival:  Comments: EMS/Foley/fever
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
## 2018-04-12 NOTE — ED Triage Notes (Signed)
Patient coming from home with c/o fever, dark cloudy urine,  possible UTI. Patient is quadriplegic. Patient has a foley in place from home.

## 2018-04-12 NOTE — Progress Notes (Signed)
Writer received report from [facility] ER, RN approx [phone]. Pt  arived to rm [phone] at [facility].

## 2018-04-12 NOTE — ED Provider Notes (Signed)
  Face-to-face evaluation   History: Here for fever and dark urine.  Patient feels achy and has fever but no cough, runny nose or dizziness.  He is quadriparetic, does not ambulate.  Physical exam: Alert, overweight, he appears comfortable.  Skin is somewhat moist.  Skin is warm.  Abdomen is without tenderness.  He has spastic paresis.  Medical screening examination/treatment/procedure(s) were conducted as a shared visit with non-physician practitioner(s) and myself.  I personally evaluated the patient during the encounter    Daleen Bo, MD 04/13/18 2024

## 2018-04-12 NOTE — ED Notes (Signed)
Patient have suprapubic catheter and unable to place temp foley. Lindsey P.A made aware.

## 2018-04-13 DIAGNOSIS — M2550 Pain in unspecified joint: Secondary | ICD-10-CM | POA: Diagnosis not present

## 2018-04-13 DIAGNOSIS — J181 Lobar pneumonia, unspecified organism: Secondary | ICD-10-CM | POA: Diagnosis not present

## 2018-04-13 DIAGNOSIS — Z7401 Bed confinement status: Secondary | ICD-10-CM | POA: Diagnosis not present

## 2018-04-13 LAB — CBC
HEMATOCRIT: 27.9 % — AB (ref 39.0–52.0)
Hemoglobin: 8.2 g/dL — ABNORMAL LOW (ref 13.0–17.0)
MCH: 25.3 pg — ABNORMAL LOW (ref 26.0–34.0)
MCHC: 29.4 g/dL — ABNORMAL LOW (ref 30.0–36.0)
MCV: 86.1 fL (ref 80.0–100.0)
Platelets: 260 10*3/uL (ref 150–400)
RBC: 3.24 MIL/uL — ABNORMAL LOW (ref 4.22–5.81)
RDW: 15.6 % — ABNORMAL HIGH (ref 11.5–15.5)
WBC: 13.3 10*3/uL — AB (ref 4.0–10.5)
nRBC: 0 % (ref 0.0–0.2)

## 2018-04-13 LAB — STREP PNEUMONIAE URINARY ANTIGEN: STREP PNEUMO URINARY ANTIGEN: NEGATIVE

## 2018-04-13 MED ORDER — CEFDINIR 300 MG PO CAPS: 300.0000 mg | ORAL_CAPSULE | Freq: Two times a day (BID) | ORAL | 0 refills | Status: AC

## 2018-04-13 MED ORDER — HEPARIN SOD (PORK) LOCK FLUSH 100 UNIT/ML IV SOLN
500.0000 [IU] | INTRAVENOUS | Status: AC | PRN
  Administered 2018-04-13: 500 [IU]
  Filled 2018-04-13: qty 5

## 2018-04-13 MED ORDER — AZITHROMYCIN 250 MG PO TABS: 250.0000 mg | ORAL_TABLET | Freq: Every day | ORAL | 0 refills | Status: DC

## 2018-04-13 MED ORDER — COLLAGENASE 250 UNIT/GM EX OINT
TOPICAL_OINTMENT | Freq: Every day | CUTANEOUS | Status: DC
  Administered 2018-04-13: 1 via TOPICAL
  Filled 2018-04-13: qty 90

## 2018-04-13 NOTE — Discharge Instructions (Signed)
[name],  You were in the hospital because of pneumonia. You are being treated with antibiotics. Please take as prescribed.

## 2018-04-13 NOTE — Discharge Summary (Signed)
Physician Discharge Summary  WALT GEATHERS TKZ:[id number] DOB: [birth year] DOA: 04/12/2018  PCP: Andria Frames, PA-C  Admit date: 04/12/2018 Discharge date: 04/13/2018  Admitted From: Home Disposition: Home  Recommendations for Outpatient Follow-up:  1. Follow up with PCP in 1 week 2. Please obtain BMP/CBC in one week 3. Please follow up on the following pending results: Blood culture final report  Home Health: None Equipment/Devices: None  Discharge Condition: Stable CODE STATUS: Full code Diet recommendation: Regular diet   Brief/Interim Summary:  Chief Complaint: Feeling bad  HPI: [name] [name] is a 52 y.o. male with medical history significant of quadriplegia, recurrent UTI, colostomy, osteomyelitis, stage IV sacral decubitus ulcer. Patient reports a one day history of not feeling well. Symptoms include malaise, development of cough with feeling of congestion and some productive sputum. He also developed a fever. He took mucinex to help with congestion which helped a little bit. Tmax of 100.2 F per EMS report.  ED Course: Vitals: Afebrile, pulse of 100, normal respirations, normotensive, on room air Labs: WBC of 19k, hemoglobin of 9.0, ANC of 16.2k Imaging: Chest x-ray significant for possible developing infection of right lower lobe Medications/Course: Ceftriaxone, azithromycin, Tylenol  Hospital course:  Community acquired pneumonia Right lower lobe infection. Associated fever and leukocytosis, but is on room air. Empirically started on ceftriaxone/azithromycin. WBC improved from 19k to 13k prior to discharge. Antibiotic regimen changed to Cefdinir and azithromycin on discharge. Sputum gram stain and culture ordered but no sample obtained prior to discharge. Urine strep negative.  Suprapubic urinary catheter History of complicated UTIs in the past. Urinalysis not suggestive of infection.  Severe malnutrition Continue protein supplementation  Discharge  Diagnoses:  Active Problems:   Community acquired pneumonia    Discharge Instructions  Discharge Instructions    Call MD for:  difficulty breathing, headache or visual disturbances   Complete by:  As directed    Call MD for:  temperature >100.4   Complete by:  As directed    Increase activity slowly   Complete by:  As directed      Allergies as of 04/13/2018      Reactions   Feraheme [ferumoxytol] Other (See Comments)   SYNCOPE   Ditropan [oxybutynin] Other (See Comments)   Hallucinations    Vancomycin Other (See Comments)   ARF 05-2016 -- affects kidneys      Medication List    TAKE these medications   azithromycin 250 MG tablet Commonly known as:  ZITHROMAX Take 1 tablet (250 mg total) by mouth daily. Start taking on:  04/14/2018   baclofen 20 MG tablet Commonly known as:  LIORESAL Take 20 mg by mouth 4 (four) times daily.   cefdinir 300 MG capsule Commonly known as:  OMNICEF Take 1 capsule (300 mg total) by mouth 2 (two) times daily for 5 days. Start taking on:  04/14/2018   ferrous sulfate 325 (65 FE) MG tablet TAKE 1 TABLET (325 MG TOTAL) BY MOUTH 3 (THREE) TIMES DAILY WITH MEALS. What changed:  See the new instructions.   metoCLOPramide 10 MG tablet Commonly known as:  REGLAN Take 1 tablet (10 mg total) by mouth every 6 (six) hours.   multivitamin with minerals Tabs tablet Take 1 tablet by mouth every morning.   nutrition supplement (JUVEN) Pack Take 1 packet by mouth 2 (two) times daily between meals.   ondansetron 8 MG disintegrating tablet Commonly known as:  ZOFRAN-ODT Take 1 tablet (8 mg total) by mouth every 8 (  eight) hours as needed for nausea or vomiting.   pantoprazole 40 MG tablet Commonly known as:  PROTONIX Take 40 mg by mouth 2 (two) times daily.   polyethylene glycol packet Commonly known as:  MIRALAX / GLYCOLAX Take 17 g by mouth daily as needed for mild constipation.   sucralfate 1 g tablet Commonly known as:  CARAFATE TAKE  1 TABLET BY MOUTH 4 TIMES A DAY   TOVIAZ 8 MG Tb24 tablet Generic drug:  fesoterodine Take 8 mg by mouth daily.   vitamin C 500 MG tablet Commonly known as:  ASCORBIC ACID Take 500 mg by mouth daily.   Zinc 50 MG Tabs Take 50 mg by mouth 2 (two) times daily.      Follow-up Information    Park Meo T, PA-C. Schedule an appointment as soon as possible for a visit in 1 week(s).   Specialty:  Physician Assistant Contact information: [address] [address] [phone]          Allergies  Allergen Reactions  . Feraheme [Ferumoxytol] Other (See Comments)    SYNCOPE  . Ditropan [Oxybutynin] Other (See Comments)    Hallucinations   . Vancomycin Other (See Comments)    ARF 05-2016 -- affects kidneys     Consultations:  None   Procedures/Studies: Dg Chest 2 View  Result Date: 04/12/2018 CLINICAL DATA:  Fever and cough. Acute respiratory failure. Quadriplegia. EXAM: CHEST - 2 VIEW COMPARISON:  02/05/2018 and chest CT from 12/17/2017 FINDINGS: Stable position of the right jugular Port-A-Cath with the tip in the SVC region. Again noted is scoliosis in the thoracolumbar spine. Again noted is a large expansile lesion involving the posterior right fifth rib. Old fractures and chronic changes in the left ribs. Cardiac silhouette is enlarged but stable. Slightly increased densities in the right lower chest and right infrahilar region. Cannot exclude airspace disease in the right lower lung. Lateral views are markedly limited. Surgical changes in the cervical spine. IMPRESSION: Slightly increased densities in the right lower chest. Findings are nonspecific but could be related to volume loss or developing infection. Stable chronic bone changes as described. Again noted is an expansile lesion of the right fifth rib. Electronically Signed   By: Markus Daft M.D.   On: 04/12/2018 15:00   Ct Abdomen Pelvis W Contrast  Result Date: 04/03/2018 CLINICAL DATA:   Acute generalized abdominal pain, LEFT side abdominal pain since 1500 hours yesterday associated with nausea but no vomiting, history of diverting colostomy, cholecystectomy, GERD, ulcer disease, hypertension, quadriplegia with decubitus ulcers EXAM: CT ABDOMEN AND PELVIS WITH CONTRAST TECHNIQUE: Multidetector CT imaging of the abdomen and pelvis was performed using the standard protocol following bolus administration of intravenous contrast. Sagittal and coronal MPR images reconstructed from axial data set. CONTRAST:  173mL ISOVUE-300 IOPAMIDOL (ISOVUE-300) INJECTION 61% IV. No oral contrast. COMPARISON:  02/05/2018 FINDINGS: Lower chest: Bibasilar atelectasis. Basilar peribronchial thickening Hepatobiliary: Gallbladder surgically absent. No focal hepatic abnormalities. No biliary dilatation. Portal fluid collection seen on prior exam resolved. Pancreas: Normal appearance Spleen: Normal appearance Adrenals/Urinary Tract: Adrenal glands normal appearance. Renal cortical thinning. Beam hardening artifacts traverse kidneys. 11 x 13 mm LEFT renal lesion indeterminate attenuation but unchanged since an earlier exam of 02/05/2017. No additional renal mass lesions. No hydronephrosis or hydroureter. Bladder decompressed by suprapubic catheter. Stomach/Bowel: Gaseous distention of stomach. Small bowel decompressed, unremarkable. Question minimal rectal wall thickening versus artifact from underdistention. Diverting colostomy with Hartmann pouch. Bowel loops otherwise unremarkable. Normal appendix.  Vascular/Lymphatic: Atherosclerotic calcifications iliac arteries. Aorta normal caliber. Retroaortic LEFT renal vein. Enlarged LEFT external iliac lymph node 18 mm short axis image 68, previously 12 mm. Mildly enlarged LEFT internal iliac lymph node 14 mm short axis image 77 previously 10 mm. 14 mm RIGHT external iliac node image 70 unchanged. Reproductive: Unremarkable prostate gland Other: No free air or free fluid.  No hernia.  Musculoskeletal: Marked bone destruction involving BILATERAL hip joints question due to septic arthritis. Bones demineralized. Areas of sclerosis identified at the RIGHT ischium, RIGHT posterior iliac bone, anterior RIGHT iliac bone, proximal RIGHT femur likely related to prior decubiti and chronic osteomyelitis. Additional decubitus cubitus ulcers are seen overlying the RIGHT sacrum, posterior RIGHT hip joint with gas extending into the joint, dorsal to the LEFT ischium, and posterolateral lower RIGHT chest wall. Significant skin thickening/infiltration inferior RIGHT buttock. IMPRESSION: No acute intra-abdominal or intrapelvic process. 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 Slight increase in size of LEFT pelvic lymph nodes since prior study. Electronically Signed   By: Lavonia Dana M.D.   On: 04/03/2018 12:13     Subjective: No issues overnight. Coughing is improved.  Discharge Exam: Vitals:   04/12/18 2107 04/13/18 0342  BP: (!) 149/90 109/74  Pulse: 98 93  Resp:  14  Temp:  98.6 F (37 C)  SpO2:  96%   Vitals:   04/12/18 1914 04/12/18 2011 04/12/18 2107 04/13/18 0342  BP:  (!) 83/53 (!) 149/90 109/74  Pulse:  90 98 93  Resp:  16  14  Temp:  98.3 F (36.8 C)  98.6 F (37 C)  TempSrc:  Oral  Oral  SpO2:  97%  96%  Weight: 91.3 kg 91.3 kg    Height: 5\' 7"  (1.702 m) 5\' 7"  (1.702 m)      General: Pt is alert, awake, not in acute distress Cardiovascular: RRR, S1/S2 +, no rubs, no gallops Respiratory: CTA bilaterally but diminished throughout, no wheezing, no rhonchi. Anterior auscultation Abdominal: Soft, NT, ND, bowel sounds + Extremities: 2+ pedal edema, no cyanosis    The results of significant diagnostics from this hospitalization (including imaging, microbiology,  ancillary and laboratory) are listed below for reference.     Microbiology: Recent Results (from the past 240 hour(s))  Culture, blood (Routine x 2)     Status: None (Preliminary result)   Collection Time: 04/12/18  2:15 PM  Result Value Ref Range Status   Specimen Description   Final    BLOOD PORTA CATH RIGHT Performed at [facility] [address] [place], [place] [address]    Special Requests   Final    BOTTLES DRAWN AEROBIC AND ANAEROBIC Blood Culture adequate volume Performed at [facility] [address], [place], [place] [address]    Culture   Final    NO GROWTH < 12 HOURS Performed at [facility] [address], [place],  [address]    Report Status PENDING  Incomplete     Labs: BNP (last 3 results) No results for input(s): BNP in the last 8760 hours. Basic Metabolic Panel: Recent Labs  Lab 04/12/18 1417  NA 137  K 3.8  CL 106  CO2 25  GLUCOSE 101*  BUN 21*  CREATININE 0.49*  CALCIUM 8.4*   Liver Function Tests:  Recent Labs  Lab 04/12/18 1417  AST 14*  ALT 13  ALKPHOS 64  BILITOT 0.9  PROT 7.3  ALBUMIN 3.1*   No results for input(s): LIPASE, AMYLASE in the last 168 hours. No results for input(s): AMMONIA in the last 168 hours. CBC: Recent Labs  Lab 04/12/18 1417 04/13/18 0901  WBC 19.0* 13.3*  NEUTROABS 16.2*  --   HGB 9.0* 8.2*  HCT 29.7* 27.9*  MCV 86.6 86.1  PLT 273 260   Urinalysis    Component Value Date/Time   COLORURINE YELLOW 04/12/2018 1640   APPEARANCEUR CLOUDY (A) 04/12/2018 1640   LABSPEC 1.015 04/12/2018 1640   PHURINE 6.0 04/12/2018 1640   GLUCOSEU NEGATIVE 04/12/2018 1640   HGBUR NEGATIVE 04/12/2018 1640   BILIRUBINUR NEGATIVE 04/12/2018 1640   KETONESUR NEGATIVE 04/12/2018 1640   PROTEINUR NEGATIVE 04/12/2018 1640   UROBILINOGEN 1.0 03/16/2015 0835   NITRITE NEGATIVE 04/12/2018 1640   LEUKOCYTESUR SMALL (A) 04/12/2018 1640   Microbiology Recent Results  (from the past 240 hour(s))  Culture, blood (Routine x 2)     Status: None (Preliminary result)   Collection Time: 04/12/18  2:15 PM  Result Value Ref Range Status   Specimen Description   Final    BLOOD PORTA CATH RIGHT Performed at [facility], [place] [address], [place], [place] [address]    Special Requests   Final    BOTTLES DRAWN AEROBIC AND ANAEROBIC Blood Culture adequate volume Performed at [facility] [address] [place], [place] [address]    Culture   Final    NO GROWTH < 12 HOURS Performed at [facility] [address], [place], [place] [address]    Report Status PENDING  Incomplete    SIGNED:   Cordelia Poche, MD [facility] 04/13/2018, 12:47 PM

## 2018-04-13 NOTE — Progress Notes (Signed)
Patient states he doesn't have a legal guardian, he has a POA which is his sister, [name]. Discharge instructions explained to patient, states he understands. Prescriptions called into [facility]. Patient lives with his sister ,who takes care of him.

## 2018-04-13 NOTE — Consult Note (Addendum)
[facility] Nurse wound consult note Reason for Consult: Patient  Is familiar to [facility] team from multiple previous admissions.Pt states he is followed by the outpatient wound center for serial debridements of left ischium and they have ordered Santyl daily for this site.  He is using Aquacel to sacrum/buttocks.  He has chronic stage 4 pressure injuries to the following locations:    Right posterior shoulder:  14X5X.3cm, 15% yellow, 85% red, visible muscle, mod amt yellow drainage, no odor  Sacrum/bilateral ischial tuberosity/perineum/scrotum with some areas of depth (epithelial tissue has grown into wound depth, epibole at edges) Patchy areas of pink dry scar tissue, Stage 4 pressure injuries in irregular shape 20X30X.3cm, 100% red and moist, mod amt tan drainage, some odor, bone palpable  Left ischium 40% eschar to edges, 60% red, mod amt tan drainage, no odor, bone palpable, 7X5X.3cm Right knee: Full thickness wound 1X1X.2cm, red and moist, small amt bloody drainage.  Left calf partial thickness abrasion: 3X1X.1cm, pink and moist Right leg partial thickness abrasion: 1X1X.1cm, pink and moist  Plan: Pt is on a low airloss bed to reduce pressure.  Foam dressings to bilat leg and right knee to protect and promote healing.  Continue present plan of care with Santyl for enzymatic debridememt of nonviable tissue to left ischium, and Aquacel to absorb drainage and provide antimicrobial benefits to sacrum/buttock wounds.   [facility] Nurse ostomy consult note Stoma type/location:  LLQ colostomy (long established) Stomal assessment/size: 1 and 5/8 inch round, raised, pink and moist.   Output: Mod amt brown semiformed stool Ostomy pouching: 2pc. pouching system  Applied barrier ring and 2 piece pouching system; 2 extra sets of each supply left at the bedside for staff nurse use.  Pt should resume use of home health assistance and followup at the outpatient wound care center after discharge. Please re-consult if  further assistance is needed.  Thank-you,  Julien Girt MSN, [place], [place], [place], [place]

## 2018-04-17 LAB — CULTURE, BLOOD (ROUTINE X 2)
CULTURE: NO GROWTH
Special Requests: ADEQUATE

## 2018-04-23 ENCOUNTER — Other Ambulatory Visit: Payer: Self-pay | Admitting: Nurse Practitioner

## 2018-04-23 ENCOUNTER — Encounter (HOSPITAL_BASED_OUTPATIENT_CLINIC_OR_DEPARTMENT_OTHER): Payer: Medicare Other | Attending: Internal Medicine

## 2018-04-23 DIAGNOSIS — L89154 Pressure ulcer of sacral region, stage 4: Secondary | ICD-10-CM | POA: Diagnosis not present

## 2018-04-23 DIAGNOSIS — L89224 Pressure ulcer of left hip, stage 4: Secondary | ICD-10-CM | POA: Diagnosis not present

## 2018-04-23 DIAGNOSIS — I10 Essential (primary) hypertension: Secondary | ICD-10-CM | POA: Insufficient documentation

## 2018-04-23 DIAGNOSIS — G8250 Quadriplegia, unspecified: Secondary | ICD-10-CM | POA: Insufficient documentation

## 2018-04-23 DIAGNOSIS — L89894 Pressure ulcer of other site, stage 4: Secondary | ICD-10-CM | POA: Insufficient documentation

## 2018-04-23 DIAGNOSIS — L89323 Pressure ulcer of left buttock, stage 3: Secondary | ICD-10-CM | POA: Insufficient documentation

## 2018-04-23 DIAGNOSIS — K3184 Gastroparesis: Secondary | ICD-10-CM

## 2018-04-23 DIAGNOSIS — G4730 Sleep apnea, unspecified: Secondary | ICD-10-CM | POA: Diagnosis not present

## 2018-04-23 DIAGNOSIS — Z9989 Dependence on other enabling machines and devices: Secondary | ICD-10-CM | POA: Diagnosis not present

## 2018-04-23 DIAGNOSIS — K219 Gastro-esophageal reflux disease without esophagitis: Secondary | ICD-10-CM

## 2018-04-23 DIAGNOSIS — L97222 Non-pressure chronic ulcer of left calf with fat layer exposed: Secondary | ICD-10-CM | POA: Insufficient documentation

## 2018-04-23 DIAGNOSIS — L89214 Pressure ulcer of right hip, stage 4: Secondary | ICD-10-CM | POA: Diagnosis not present

## 2018-04-23 MED ORDER — SUCRALFATE 1 G PO TABS: 1.0000 g | ORAL_TABLET | Freq: Four times a day (QID) | ORAL | 0 refills | Status: AC

## 2018-04-30 ENCOUNTER — Telehealth: Payer: Self-pay

## 2018-04-30 NOTE — Telephone Encounter (Signed)
Left pt v/m for pt to call office to schedule an appointment. YRL,RMA

## 2018-04-30 NOTE — Telephone Encounter (Signed)
-----   Message from Minette Brine, [place] sent at 04/09/2018 10:58 AM EST ----- Regarding: hospital follow up Patient needs a hospital follow up.

## 2018-05-01 DIAGNOSIS — S31102A Unspecified open wound of abdominal wall, epigastric region without penetration into peritoneal cavity, initial encounter: Secondary | ICD-10-CM | POA: Diagnosis not present

## 2018-05-01 DIAGNOSIS — Z933 Colostomy status: Secondary | ICD-10-CM | POA: Diagnosis not present

## 2018-05-01 DIAGNOSIS — L8944 Pressure ulcer of contiguous site of back, buttock and hip, stage 4: Secondary | ICD-10-CM | POA: Diagnosis not present

## 2018-05-01 DIAGNOSIS — S31809A Unspecified open wound of unspecified buttock, initial encounter: Secondary | ICD-10-CM | POA: Diagnosis not present

## 2018-05-04 DIAGNOSIS — G8220 Paraplegia, unspecified: Secondary | ICD-10-CM | POA: Diagnosis not present

## 2018-05-04 DIAGNOSIS — K210 Gastro-esophageal reflux disease with esophagitis: Secondary | ICD-10-CM | POA: Diagnosis not present

## 2018-05-04 DIAGNOSIS — G8250 Quadriplegia, unspecified: Secondary | ICD-10-CM | POA: Diagnosis not present

## 2018-05-04 DIAGNOSIS — I10 Essential (primary) hypertension: Secondary | ICD-10-CM | POA: Diagnosis not present

## 2018-05-08 DIAGNOSIS — R1111 Vomiting without nausea: Secondary | ICD-10-CM | POA: Diagnosis not present

## 2018-05-08 DIAGNOSIS — R110 Nausea: Secondary | ICD-10-CM | POA: Diagnosis not present

## 2018-05-09 ENCOUNTER — Emergency Department (HOSPITAL_COMMUNITY): Payer: Medicare Other

## 2018-05-09 ENCOUNTER — Encounter (HOSPITAL_COMMUNITY): Payer: Self-pay

## 2018-05-09 ENCOUNTER — Inpatient Hospital Stay (HOSPITAL_COMMUNITY)
Admission: EM | Admit: 2018-05-09 | Discharge: 2018-05-11 | DRG: 438 | Disposition: A | Discharge status: Home Health | Payer: Medicare Other | Attending: Internal Medicine | Admitting: Internal Medicine

## 2018-05-09 ENCOUNTER — Other Ambulatory Visit: Payer: Self-pay

## 2018-05-09 DIAGNOSIS — E876 Hypokalemia: Secondary | ICD-10-CM | POA: Diagnosis present

## 2018-05-09 DIAGNOSIS — S14105S Unspecified injury at C5 level of cervical spinal cord, sequela: Secondary | ICD-10-CM | POA: Diagnosis not present

## 2018-05-09 DIAGNOSIS — Z833 Family history of diabetes mellitus: Secondary | ICD-10-CM | POA: Diagnosis not present

## 2018-05-09 DIAGNOSIS — Z9989 Dependence on other enabling machines and devices: Secondary | ICD-10-CM

## 2018-05-09 DIAGNOSIS — L89154 Pressure ulcer of sacral region, stage 4: Secondary | ICD-10-CM | POA: Diagnosis not present

## 2018-05-09 DIAGNOSIS — Z803 Family history of malignant neoplasm of breast: Secondary | ICD-10-CM

## 2018-05-09 DIAGNOSIS — G8253 Quadriplegia, C5-C7 complete: Secondary | ICD-10-CM | POA: Diagnosis present

## 2018-05-09 DIAGNOSIS — N390 Urinary tract infection, site not specified: Secondary | ICD-10-CM | POA: Diagnosis present

## 2018-05-09 DIAGNOSIS — D638 Anemia in other chronic diseases classified elsewhere: Secondary | ICD-10-CM | POA: Diagnosis present

## 2018-05-09 DIAGNOSIS — K8500 Idiopathic acute pancreatitis without necrosis or infection: Secondary | ICD-10-CM | POA: Diagnosis not present

## 2018-05-09 DIAGNOSIS — Z881 Allergy status to other antibiotic agents status: Secondary | ICD-10-CM

## 2018-05-09 DIAGNOSIS — T83518A Infection and inflammatory reaction due to other urinary catheter, initial encounter: Secondary | ICD-10-CM | POA: Diagnosis present

## 2018-05-09 DIAGNOSIS — M4628 Osteomyelitis of vertebra, sacral and sacrococcygeal region: Secondary | ICD-10-CM | POA: Diagnosis present

## 2018-05-09 DIAGNOSIS — L89223 Pressure ulcer of left hip, stage 3: Secondary | ICD-10-CM | POA: Diagnosis not present

## 2018-05-09 DIAGNOSIS — T471X6A Underdosing of other antacids and anti-gastric-secretion drugs, initial encounter: Secondary | ICD-10-CM | POA: Diagnosis present

## 2018-05-09 DIAGNOSIS — G459 Transient cerebral ischemic attack, unspecified: Secondary | ICD-10-CM | POA: Diagnosis not present

## 2018-05-09 DIAGNOSIS — R109 Unspecified abdominal pain: Secondary | ICD-10-CM | POA: Diagnosis not present

## 2018-05-09 DIAGNOSIS — Z9049 Acquired absence of other specified parts of digestive tract: Secondary | ICD-10-CM | POA: Diagnosis not present

## 2018-05-09 DIAGNOSIS — G4733 Obstructive sleep apnea (adult) (pediatric): Secondary | ICD-10-CM | POA: Diagnosis not present

## 2018-05-09 DIAGNOSIS — Z2239 Carrier of other specified bacterial diseases: Secondary | ICD-10-CM

## 2018-05-09 DIAGNOSIS — S12400S Unspecified displaced fracture of fifth cervical vertebra, sequela: Secondary | ICD-10-CM | POA: Diagnosis not present

## 2018-05-09 DIAGNOSIS — Z933 Colostomy status: Secondary | ICD-10-CM | POA: Diagnosis not present

## 2018-05-09 DIAGNOSIS — K210 Gastro-esophageal reflux disease with esophagitis: Secondary | ICD-10-CM | POA: Diagnosis present

## 2018-05-09 DIAGNOSIS — T83511A Infection and inflammatory reaction due to indwelling urethral catheter, initial encounter: Secondary | ICD-10-CM

## 2018-05-09 DIAGNOSIS — Z79899 Other long term (current) drug therapy: Secondary | ICD-10-CM

## 2018-05-09 DIAGNOSIS — Z885 Allergy status to narcotic agent status: Secondary | ICD-10-CM

## 2018-05-09 DIAGNOSIS — Z888 Allergy status to other drugs, medicaments and biological substances status: Secondary | ICD-10-CM

## 2018-05-09 DIAGNOSIS — K859 Acute pancreatitis without necrosis or infection, unspecified: Secondary | ICD-10-CM | POA: Diagnosis not present

## 2018-05-09 DIAGNOSIS — Y846 Urinary catheterization as the cause of abnormal reaction of the patient, or of later complication, without mention of misadventure at the time of the procedure: Secondary | ICD-10-CM | POA: Diagnosis present

## 2018-05-09 DIAGNOSIS — L89894 Pressure ulcer of other site, stage 4: Secondary | ICD-10-CM | POA: Diagnosis present

## 2018-05-09 DIAGNOSIS — Z91128 Patient's intentional underdosing of medication regimen for other reason: Secondary | ICD-10-CM | POA: Diagnosis not present

## 2018-05-09 DIAGNOSIS — R1013 Epigastric pain: Secondary | ICD-10-CM | POA: Diagnosis not present

## 2018-05-09 DIAGNOSIS — Z87440 Personal history of urinary (tract) infections: Secondary | ICD-10-CM

## 2018-05-09 DIAGNOSIS — M2550 Pain in unspecified joint: Secondary | ICD-10-CM | POA: Diagnosis not present

## 2018-05-09 DIAGNOSIS — Z8711 Personal history of peptic ulcer disease: Secondary | ICD-10-CM | POA: Diagnosis not present

## 2018-05-09 DIAGNOSIS — R1084 Generalized abdominal pain: Secondary | ICD-10-CM | POA: Diagnosis not present

## 2018-05-09 DIAGNOSIS — R110 Nausea: Secondary | ICD-10-CM | POA: Diagnosis not present

## 2018-05-09 DIAGNOSIS — D649 Anemia, unspecified: Secondary | ICD-10-CM | POA: Diagnosis not present

## 2018-05-09 DIAGNOSIS — Z7401 Bed confinement status: Secondary | ICD-10-CM | POA: Diagnosis not present

## 2018-05-09 DIAGNOSIS — F29 Unspecified psychosis not due to a substance or known physiological condition: Secondary | ICD-10-CM | POA: Diagnosis not present

## 2018-05-09 LAB — CBC WITH DIFFERENTIAL/PLATELET
ABS IMMATURE GRANULOCYTES: 0.03 10*3/uL (ref 0.00–0.07)
Abs Immature Granulocytes: 0.04 10*3/uL (ref 0.00–0.07)
BASOS PCT: 0 %
Basophils Absolute: 0 10*3/uL (ref 0.0–0.1)
Basophils Absolute: 0 10*3/uL (ref 0.0–0.1)
Basophils Relative: 0 %
Eosinophils Absolute: 0.4 10*3/uL (ref 0.0–0.5)
Eosinophils Absolute: 0.6 10*3/uL — ABNORMAL HIGH (ref 0.0–0.5)
Eosinophils Relative: 3 %
Eosinophils Relative: 6 %
HCT: 31.2 % — ABNORMAL LOW (ref 39.0–52.0)
HCT: 27.1 % — ABNORMAL LOW (ref 39.0–52.0)
Hemoglobin: 8.9 g/dL — ABNORMAL LOW (ref 13.0–17.0)
Hemoglobin: 7.9 g/dL — ABNORMAL LOW (ref 13.0–17.0)
Immature Granulocytes: 0 %
Immature Granulocytes: 0 %
Lymphocytes Relative: 12 %
Lymphocytes Relative: 14 %
Lymphs Abs: 1.4 10*3/uL (ref 0.7–4.0)
Lymphs Abs: 1.4 10*3/uL (ref 0.7–4.0)
MCH: 24.7 pg — AB (ref 26.0–34.0)
MCH: 25.7 pg — AB (ref 26.0–34.0)
MCHC: 28.5 g/dL — ABNORMAL LOW (ref 30.0–36.0)
MCHC: 29.2 g/dL — ABNORMAL LOW (ref 30.0–36.0)
MCV: 86.7 fL (ref 80.0–100.0)
MCV: 88.3 fL (ref 80.0–100.0)
MONO ABS: 1 10*3/uL (ref 0.1–1.0)
Monocytes Absolute: 1.3 10*3/uL — ABNORMAL HIGH (ref 0.1–1.0)
Monocytes Relative: 11 %
Monocytes Relative: 10 %
NEUTROS ABS: 8.9 10*3/uL — AB (ref 1.7–7.7)
NEUTROS PCT: 74 %
Neutro Abs: 6.8 10*3/uL (ref 1.7–7.7)
Neutrophils Relative %: 70 %
Platelets: 335 10*3/uL (ref 150–400)
Platelets: 280 10*3/uL (ref 150–400)
RBC: 3.6 MIL/uL — ABNORMAL LOW (ref 4.22–5.81)
RBC: 3.07 MIL/uL — ABNORMAL LOW (ref 4.22–5.81)
RDW: 15.2 % (ref 11.5–15.5)
RDW: 15.6 % — ABNORMAL HIGH (ref 11.5–15.5)
WBC: 12.1 10*3/uL — ABNORMAL HIGH (ref 4.0–10.5)
WBC: 9.8 10*3/uL (ref 4.0–10.5)
nRBC: 0 % (ref 0.0–0.2)
nRBC: 0 % (ref 0.0–0.2)

## 2018-05-09 LAB — PROTIME-INR
INR: 1.19
Prothrombin Time: 14.9 seconds (ref 11.4–15.2)

## 2018-05-09 LAB — URINALYSIS, ROUTINE W REFLEX MICROSCOPIC
BILIRUBIN URINE: NEGATIVE
Glucose, UA: NEGATIVE mg/dL
KETONES UR: 20 mg/dL — AB
Nitrite: NEGATIVE
Protein, ur: NEGATIVE mg/dL
SPECIFIC GRAVITY, URINE: 1.015 (ref 1.005–1.030)
WBC, UA: >50 WBC/hpf — ABNORMAL HIGH (ref 0–5)
pH: 6 (ref 5.0–8.0)

## 2018-05-09 LAB — COMPREHENSIVE METABOLIC PANEL
ALT: 19 U/L (ref 0–44)
AST: 31 U/L (ref 15–41)
Albumin: 3 g/dL — ABNORMAL LOW (ref 3.5–5.0)
Alkaline Phosphatase: 68 U/L (ref 38–126)
Anion gap: 11 (ref 5–15)
BUN: 49 mg/dL — ABNORMAL HIGH (ref 6–20)
CO2: 20 mmol/L — ABNORMAL LOW (ref 22–32)
CREATININE: 0.57 mg/dL — AB (ref 0.61–1.24)
Calcium: 9.1 mg/dL (ref 8.9–10.3)
Chloride: 114 mmol/L — ABNORMAL HIGH (ref 98–111)
GFR calc non Af Amer: >60 mL/min (ref >60)
Glucose, Bld: 83 mg/dL (ref 70–99)
Potassium: 3.4 mmol/L — ABNORMAL LOW (ref 3.5–5.1)
Sodium: 145 mmol/L (ref 135–145)
Total Bilirubin: 0.6 mg/dL (ref 0.3–1.2)
Total Protein: 7.5 g/dL (ref 6.5–8.1)

## 2018-05-09 LAB — BASIC METABOLIC PANEL
Anion gap: 8 (ref 5–15)
BUN: 37 mg/dL — ABNORMAL HIGH (ref 6–20)
CHLORIDE: 119 mmol/L — AB (ref 98–111)
CO2: 19 mmol/L — ABNORMAL LOW (ref 22–32)
Calcium: 8.5 mg/dL — ABNORMAL LOW (ref 8.9–10.3)
Creatinine, Ser: 0.56 mg/dL — ABNORMAL LOW (ref 0.61–1.24)
GFR calc Af Amer: >60 mL/min (ref >60)
GFR calc non Af Amer: >60 mL/min (ref >60)
GLUCOSE: 67 mg/dL — AB (ref 70–99)
Potassium: 3.3 mmol/L — ABNORMAL LOW (ref 3.5–5.1)
Sodium: 146 mmol/L — ABNORMAL HIGH (ref 135–145)

## 2018-05-09 LAB — LIPASE, BLOOD
Lipase: 1047 U/L — ABNORMAL HIGH (ref 11–51)
Lipase: 296 U/L — ABNORMAL HIGH (ref 11–51)

## 2018-05-09 LAB — I-STAT CG4 LACTIC ACID, ED: Lactic Acid, Venous: 1.07 mmol/L (ref 0.5–1.9)

## 2018-05-09 LAB — HEPATIC FUNCTION PANEL
ALT: 17 U/L (ref 0–44)
AST: 27 U/L (ref 15–41)
Albumin: 2.9 g/dL — ABNORMAL LOW (ref 3.5–5.0)
Alkaline Phosphatase: 61 U/L (ref 38–126)
Bilirubin, Direct: <0.1 mg/dL (ref 0.0–0.2)
Total Bilirubin: 1 mg/dL (ref 0.3–1.2)
Total Protein: 6.4 g/dL — ABNORMAL LOW (ref 6.5–8.1)

## 2018-05-09 LAB — TRIGLYCERIDES: Triglycerides: 76 mg/dL (ref <150)

## 2018-05-09 LAB — TROPONIN I
Troponin I: <0.03 ng/mL (ref <0.03)
Troponin I: <0.03 ng/mL (ref <0.03)
Troponin I: <0.03 ng/mL (ref <0.03)

## 2018-05-09 LAB — C-REACTIVE PROTEIN: CRP: 15.3 mg/dL — ABNORMAL HIGH (ref <1.0)

## 2018-05-09 MED ORDER — PANTOPRAZOLE SODIUM 40 MG PO TBEC
40.0000 mg | DELAYED_RELEASE_TABLET | Freq: Two times a day (BID) | ORAL | Status: DC
  Administered 2018-05-09 – 2018-05-11 (×6): 40 mg via ORAL
  Filled 2018-05-09 (×6): qty 1

## 2018-05-09 MED ORDER — PANTOPRAZOLE SODIUM 40 MG IV SOLR
40.0000 mg | Freq: Once | INTRAVENOUS | Status: AC
  Administered 2018-05-09: 40 mg via INTRAVENOUS
  Filled 2018-05-09: qty 40

## 2018-05-09 MED ORDER — SODIUM CHLORIDE (PF) 0.9 % IJ SOLN
INTRAMUSCULAR | Status: AC
  Administered 2018-05-09: 05:00:00
  Filled 2018-05-09: qty 50

## 2018-05-09 MED ORDER — SODIUM CHLORIDE 0.9 % IV BOLUS
1000.0000 mL | Freq: Once | INTRAVENOUS | Status: AC
  Administered 2018-05-09: 1000 mL via INTRAVENOUS

## 2018-05-09 MED ORDER — ZINC SULFATE 220 (50 ZN) MG PO CAPS
220.0000 mg | ORAL_CAPSULE | Freq: Every day | ORAL | Status: DC
  Administered 2018-05-09 – 2018-05-11 (×3): 220 mg via ORAL
  Filled 2018-05-09 (×3): qty 1

## 2018-05-09 MED ORDER — ENOXAPARIN SODIUM 40 MG/0.4ML ~~LOC~~ SOLN
40.0000 mg | SUBCUTANEOUS | Status: DC
  Administered 2018-05-09 – 2018-05-10 (×2): 40 mg via SUBCUTANEOUS
  Filled 2018-05-09 (×2): qty 0.4

## 2018-05-09 MED ORDER — IOPAMIDOL (ISOVUE-300) INJECTION 61%
INTRAVENOUS | Status: AC
  Filled 2018-05-09: qty 100

## 2018-05-09 MED ORDER — ACETAMINOPHEN 325 MG PO TABS: 650.0000 mg | ORAL_TABLET | Freq: Four times a day (QID) | ORAL | Status: DC | PRN

## 2018-05-09 MED ORDER — VITAMIN C 500 MG PO TABS
500.0000 mg | ORAL_TABLET | Freq: Every day | ORAL | Status: DC
  Administered 2018-05-09 – 2018-05-11 (×3): 500 mg via ORAL
  Filled 2018-05-09 (×3): qty 1

## 2018-05-09 MED ORDER — ONDANSETRON HCL 4 MG/2ML IJ SOLN
4.0000 mg | Freq: Once | INTRAMUSCULAR | Status: AC
  Administered 2018-05-09: 4 mg via INTRAVENOUS
  Filled 2018-05-09: qty 2

## 2018-05-09 MED ORDER — ACETAMINOPHEN 650 MG RE SUPP: 650.0000 mg | Freq: Four times a day (QID) | RECTAL | Status: DC | PRN

## 2018-05-09 MED ORDER — ONDANSETRON HCL 4 MG PO TABS: 4.0000 mg | ORAL_TABLET | Freq: Four times a day (QID) | ORAL | Status: DC | PRN

## 2018-05-09 MED ORDER — SUCRALFATE 1 G PO TABS
1.0000 g | ORAL_TABLET | Freq: Three times a day (TID) | ORAL | Status: DC
  Administered 2018-05-09 – 2018-05-11 (×12): 1 g via ORAL
  Filled 2018-05-09 (×12): qty 1

## 2018-05-09 MED ORDER — FESOTERODINE FUMARATE ER 8 MG PO TB24
8.0000 mg | ORAL_TABLET | Freq: Every day | ORAL | Status: DC
  Administered 2018-05-09 – 2018-05-11 (×3): 8 mg via ORAL
  Filled 2018-05-09 (×3): qty 1

## 2018-05-09 MED ORDER — FERROUS SULFATE 325 (65 FE) MG PO TABS
325.0000 mg | ORAL_TABLET | Freq: Three times a day (TID) | ORAL | Status: DC
  Administered 2018-05-09 – 2018-05-11 (×9): 325 mg via ORAL
  Filled 2018-05-09 (×8): qty 1

## 2018-05-09 MED ORDER — ONDANSETRON HCL 4 MG/2ML IJ SOLN: 4.0000 mg | Freq: Four times a day (QID) | INTRAMUSCULAR | Status: DC | PRN

## 2018-05-09 MED ORDER — ZINC 50 MG PO TABS: 50.0000 mg | ORAL_TABLET | Freq: Two times a day (BID) | ORAL | Status: DC

## 2018-05-09 MED ORDER — BACLOFEN 20 MG PO TABS
20.0000 mg | ORAL_TABLET | Freq: Four times a day (QID) | ORAL | Status: DC
  Administered 2018-05-09 – 2018-05-11 (×12): 20 mg via ORAL
  Filled 2018-05-09 (×12): qty 1

## 2018-05-09 MED ORDER — LACTATED RINGERS IV SOLN
INTRAVENOUS | Status: AC
  Administered 2018-05-09 (×2): via INTRAVENOUS

## 2018-05-09 MED ORDER — IOPAMIDOL (ISOVUE-300) INJECTION 61%
100.0000 mL | Freq: Once | INTRAVENOUS | Status: AC | PRN
  Administered 2018-05-09: 100 mL via INTRAVENOUS

## 2018-05-09 MED ORDER — JUVEN PO PACK
1.0000 | PACK | Freq: Two times a day (BID) | ORAL | Status: DC
  Administered 2018-05-09 – 2018-05-11 (×6): 1 via ORAL
  Filled 2018-05-09 (×6): qty 1

## 2018-05-09 MED ORDER — ADULT MULTIVITAMIN W/MINERALS CH
1.0000 | ORAL_TABLET | Freq: Every morning | ORAL | Status: DC
  Administered 2018-05-09 – 2018-05-11 (×3): 1 via ORAL
  Filled 2018-05-09 (×3): qty 1

## 2018-05-09 MED ORDER — POTASSIUM CHLORIDE 10 MEQ/100ML IV SOLN
10.0000 meq | INTRAVENOUS | Status: AC
  Administered 2018-05-09 (×2): 10 meq via INTRAVENOUS
  Filled 2018-05-09 (×2): qty 100

## 2018-05-09 NOTE — Plan of Care (Signed)
52 year old male with a history of quadriplegia colostomy and suprapubic catheter lives at home with his sister chronic osteomyelitis with sacral decubitus ulcer admitted with abdominal pain found to have a pancreatitis.  He was admitted this morning.  Wound care consulted for stage IV sacral decubitus .

## 2018-05-09 NOTE — ED Triage Notes (Signed)
Pt from home with abdominal pain x 4days. States he just couldn't handle it anymore. +n -n/d. Pain is 4/10. -fevers -blood thinners

## 2018-05-09 NOTE — ED Notes (Signed)
Bed: [facility]28 Expected date:  Expected time:  Means of arrival:  Comments: EMS 52 yo male abdominal pain for 4-5 days
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
## 2018-05-09 NOTE — Plan of Care (Signed)

## 2018-05-09 NOTE — ED Notes (Signed)
Patient transported to X-ray 
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
## 2018-05-10 DIAGNOSIS — R1013 Epigastric pain: Secondary | ICD-10-CM

## 2018-05-10 LAB — CBC
HCT: 24.2 % — ABNORMAL LOW (ref 39.0–52.0)
HEMOGLOBIN: 6.9 g/dL — AB (ref 13.0–17.0)
MCH: 25.2 pg — ABNORMAL LOW (ref 26.0–34.0)
MCHC: 28.5 g/dL — ABNORMAL LOW (ref 30.0–36.0)
MCV: 88.3 fL (ref 80.0–100.0)
Platelets: 271 10*3/uL (ref 150–400)
RBC: 2.74 MIL/uL — ABNORMAL LOW (ref 4.22–5.81)
RDW: 15.6 % — ABNORMAL HIGH (ref 11.5–15.5)
WBC: 8.2 10*3/uL (ref 4.0–10.5)
nRBC: 0 % (ref 0.0–0.2)

## 2018-05-10 LAB — COMPREHENSIVE METABOLIC PANEL
ALT: 17 U/L (ref 0–44)
AST: 26 U/L (ref 15–41)
Albumin: 2.5 g/dL — ABNORMAL LOW (ref 3.5–5.0)
Alkaline Phosphatase: 60 U/L (ref 38–126)
Anion gap: 5 (ref 5–15)
BUN: 21 mg/dL — ABNORMAL HIGH (ref 6–20)
CALCIUM: 8.4 mg/dL — AB (ref 8.9–10.3)
CO2: 23 mmol/L (ref 22–32)
Chloride: 113 mmol/L — ABNORMAL HIGH (ref 98–111)
Creatinine, Ser: 0.43 mg/dL — ABNORMAL LOW (ref 0.61–1.24)
GFR calc Af Amer: >60 mL/min (ref >60)
Glucose, Bld: 102 mg/dL — ABNORMAL HIGH (ref 70–99)
Potassium: 3.1 mmol/L — ABNORMAL LOW (ref 3.5–5.1)
Sodium: 141 mmol/L (ref 135–145)
Total Bilirubin: 0.6 mg/dL (ref 0.3–1.2)
Total Protein: 6 g/dL — ABNORMAL LOW (ref 6.5–8.1)

## 2018-05-10 LAB — PREPARE RBC (CROSSMATCH)

## 2018-05-10 LAB — LIPASE, BLOOD: Lipase: 89 U/L — ABNORMAL HIGH (ref 11–51)

## 2018-05-10 MED ORDER — SODIUM CHLORIDE 0.9% IV SOLUTION
Freq: Once | INTRAVENOUS | Status: AC
  Administered 2018-05-10: 08:00:00 via INTRAVENOUS

## 2018-05-10 MED ORDER — POTASSIUM CHLORIDE CRYS ER 20 MEQ PO TBCR
40.0000 meq | EXTENDED_RELEASE_TABLET | Freq: Once | ORAL | Status: AC
  Administered 2018-05-10: 40 meq via ORAL
  Filled 2018-05-10: qty 2

## 2018-05-10 MED ORDER — DAKINS (1/4 STRENGTH) 0.125 % EX SOLN
Freq: Three times a day (TID) | CUTANEOUS | Status: DC
  Administered 2018-05-10: 1
  Administered 2018-05-11 (×2)
  Filled 2018-05-10: qty 473

## 2018-05-10 NOTE — Progress Notes (Signed)
CRITICAL VALUE ALERT  Critical Value: Hemoglobin level 6.9  Date & Time Notied:  05/10/2018  0506  Provider Notified: Admin  Orders Received/Actions taken: See new orders.

## 2018-05-10 NOTE — Consult Note (Signed)
[place] Nurse wound consult note Reason for Consult: Patient who is well known to our department admitted with wounds.  Last seen by my partner D. Engels on 04/13/18 (approximately 1 month ago).  Wounds (Chronic Stage 4) have not changed significantly since that assessment.  Patient reports that he continues to be followed by the outpatient [facility], but that he missed an appointment on 12/23 (Monday) due to illness. He has been using Aquacel Ag+ to the buttocks and right thoracic wounds; he has been using Santyl to the left hip. Patient states he has been treated (and had surgery at) [facility] in October. Wound type:Pressure Pressure Injury POA: Yes Measurement: Right thoracic area Stage 4:  14cm x 6cm x 0.4cm with 80% red, 20 % yellow tissue. Moderate to large amount yellow drainage, strong odor. Sacrum/bilateral ischial tuberosity/perineal area and scrotum Stage 4:  80% healed with no return of melanin, so pink epithelialized tissue.  Twenty percent (20%) surface wounds in this Stage 4 pressure injury.  Irregularly shaped wound measuring approximately 22cm x 30cm x 0.2cm.  Friable. Small amount of serosanguinous exudate upon dressing removal. No odor.  Left hip Stage 3 Wound:  7cm x 6cm x 0.8cm with 80% pink, 20% eschar from 12-3 o' clock. Undermining measuring 1.5cm from 9-12 o'clock.  Strong odor and moderate exudate noted upon dressing removal.   Partial thickness open areas at left knee, abdomen, right calf and two on left forearm (secondary to metal bar in hand splint).  These areas will be dressed with thin film dressings or foam and changed twice weekly and PRN. Wound bed: As described above Drainage (amount, consistency, odor) Large amounts of drainage with strong odor from left hip and right thoracic wounds.  Dressings saturated upon my assessment. Periwound: intact, dry and with evidence of previous wound healing Dressing  procedure/placement/frequency: I will begin using sodium hypochlorite solution to the left hip with TID dressing changes for three days, then begin TID wound care to the wound with NS dressings. If you desire, a surgical (Plastics) could be considered for assessment of left hip wound while in house. I will continue silver hydrofiber (Aquacel Ag+) to the right thoracic and buttocks wounds.Two superficial areas on the left forearm that have made contact with the steel bar on his wrist splint will be covered with thin film transparent dressings and the bar padded to prevent further injury. The abdominal wound (superficial) is to be dressed twice weekly with a silicone foam dressing. A mattress replacement with low air loss feature and bilateral Prevalon pressure redistribution Heel boots are provided.    [place] Nurse ostomy consult note Stoma type/location: LLQ colostomy (long standing) Stomal assessment/size: 1 and 5/8 inches round, slightly raised, pale pink and moist. Os at center. Peristomal assessment: Not seen today, pouching system applied on Saturday, 05/05/18 is intact and patient changes one weekly. Treatment options for stomal/peristomal skin: N/A Output: Brown stool, soft Ostomy pouching: 2pc., 2 and 3/4 inch pouching system.  Orders placed with supply numbers to keep a small supply at bedside.. Education provided: None required.  [place] nursing team will not follow, but will remain available to this patient, the nursing and medical teams.  Please re-consult if needed. Thanks, Maudie Flakes, MSN, RN, [place], Arther Abbott  Pager# [phone]

## 2018-05-10 NOTE — Progress Notes (Signed)
PROGRESS NOTE    [name] [name]  FUX:[id number] DOB: 1965/08/02 DOA: 05/09/2018 PCP: Andria Frames, PA-C   Brief Narrative:52 y.o. male with history of quadriplegia with colostomy and suprapubic catheter and sacral decubitus ulcer with chronic osteomyelitis who was admitted few months ago for GI bleed EGD showed erosive esophagitis and patient was placed on PPI and Carafate.  Colonoscopy at that time was negative.  Patient comes to the ER today because of increasing pain in the epigastric area denies any chest pain denies any shortness of breath denies any fever chills had some nausea denies any vomiting.  Patient symptoms have been persistent for last 4 days.  ED Course: In the ER patient's labs showed elevated lipase of thousand with CAT scan showing pancreatitis.  Given the symptoms patient was given fluids and admitted for further management.  Patient states he has had cholecystectomy previously and there is no definite evidence of any CBD stones at this time.  Patient states he has not had any alcohol since June of this year and prior to that he has had alcohol very seldom.   Assessment & Plan:   Principal Problem:   Acute pancreatitis Active Problems:   Sacral decubitus ulcer, stage IV ([name]   S/P colostomy ([name]   OSA on CPAP   Anemia of chronic disease   Quadriplegia, C5-C7, complete ([name]   UTI (urinary tract infection) due to urinary indwelling catheter ([place]) 1. Acute pancreatitis -cause not clear.  Has had previous cholecystectomy and patient is nonalcoholic.triglyceride level is only 76.  His pain is better enough that he wants to try eating something.  We will start him on a soft diet.   2. History of GI bleed with esophagitis on PPI.  Hemoglobin low at 6.9.  Will transfuse PRBC.  DC Lovenox. 3. Stage IV sacral decubitus and thoracic ulcers.  Does not look infected and will consult wound team.  Await wound care consult. 4. Sleep apnea on CPAP. 5. Anemia of chronic  disease follow CBC.  Confused. 6. UA shows features concerning for UTI but patient has chronic indwelling catheter likely colonization.  Follow urine cultures.  Patient is afebrile. 7. Quadriplegia. 8 hypokalemia replete and recheck Given the multiple comorbidities patient will require     Pressure Injury 08/27/16 Stage IV - Full thickness tissue loss with exposed bone, tendon or muscle. (Active)  08/27/16 1610  Location: Shoulder  Location Orientation: Right  Staging: Stage IV - Full thickness tissue loss with exposed bone, tendon or muscle.  Wound Description (Comments):   Present on Admission: Yes     Pressure Injury 10/03/17 Stage II -  Partial thickness loss of dermis presenting as a shallow open ulcer with a red, pink wound bed without slough. (Active)  10/03/17 0130  Location: Leg  Location Orientation: Right;Lower;Lateral  Staging: Stage II -  Partial thickness loss of dermis presenting as a shallow open ulcer with a red, pink wound bed without slough.  Wound Description (Comments):   Present on Admission: Yes     Pressure Injury 08/27/16 Stage II -  Partial thickness loss of dermis presenting as a shallow open ulcer with a red, pink wound bed without slough. (Active)  08/27/16 1610  Location: Knee  Location Orientation: Left;Lateral  Staging: Stage II -  Partial thickness loss of dermis presenting as a shallow open ulcer with a red, pink wound bed without slough.  Wound Description (Comments):   Present on Admission: Yes     Pressure Injury 08/27/16  Unstageable - Full thickness tissue loss in which the base of the ulcer is covered by slough (yellow, tan, gray, green or brown) and/or eschar (tan, brown or black) in the wound bed. (Active)  08/27/16 1610  Location: Leg  Location Orientation: Left;Medial  Staging: Unstageable - Full thickness tissue loss in which the base of the ulcer is covered by slough (yellow, tan, gray, green or brown) and/or eschar (tan, brown or  black) in the wound bed.  Wound Description (Comments):   Present on Admission: Yes     Pressure Injury 10/03/17 Stage IV - Full thickness tissue loss with exposed bone, tendon or muscle. pale, pink nongranulating wound bed (Active)  10/03/17 0130  Location: Ischial tuberosity  Location Orientation: Left  Staging: Stage IV - Full thickness tissue loss with exposed bone, tendon or muscle.  Wound Description (Comments): pale, pink nongranulating wound bed  Present on Admission: Yes     Pressure Injury 07/19/17 Stage IV - Full thickness tissue loss with exposed bone, tendon or muscle. pale, clean, pink, non granulating wound bed (Active)  07/19/17 1200  Location: Sacrum  Location Orientation: Right;Left  Staging: Stage IV - Full thickness tissue loss with exposed bone, tendon or muscle.  Wound Description (Comments): pale, clean, pink, non granulating wound bed  Present on Admission: Yes     Pressure Injury 07/19/17 Unstageable - Full thickness tissue loss in which the base of the ulcer is covered by slough (yellow, tan, gray, green or brown) and/or eschar (tan, brown or black) in the wound bed. pink with gray devitalized tissue to perimeter (Active)  07/19/17 1200  Location: Chest  Location Orientation: Right;Lateral  Staging: Unstageable - Full thickness tissue loss in which the base of the ulcer is covered by slough (yellow, tan, gray, green or brown) and/or eschar (tan, brown or black) in the wound bed.  Wound Description (Comments): pink with gray devitalized tissue to perimeter, wound bed slick and shiny  Present on Admission: Yes     Pressure Injury 07/19/17 Stage IV - Full thickness tissue loss with exposed bone, tendon or muscle. pale, clean, pink, non granular, slick in apperance (Active)  07/19/17 1200  Location: Other (Comment)  Location Orientation: Left  Staging: Stage IV - Full thickness tissue loss with exposed bone, tendon or muscle.  Wound Description (Comments): pale,  clean, pink, non granular, slick in apperance  Present on Admission: Yes     Pressure Injury 10/03/17 Stage III -  Full thickness tissue loss. Subcutaneous fat may be visible but bone, tendon or muscle are NOT exposed. (Active)  10/03/17 0130  Location: Back  Location Orientation: Right  Staging: Stage III -  Full thickness tissue loss. Subcutaneous fat may be visible but bone, tendon or muscle are NOT exposed.  Wound Description (Comments):   Present on Admission: Yes     Pressure Injury 10/03/17 Stage II -  Partial thickness loss of dermis presenting as a shallow open ulcer with a red, pink wound bed without slough. dime size (Active)  10/03/17 0130  Location: Heel  Location Orientation: Right  Staging: Stage II -  Partial thickness loss of dermis presenting as a shallow open ulcer with a red, pink wound bed without slough.  Wound Description (Comments): dime size  Present on Admission: Yes     Pressure Injury 10/30/17 Unstageable - Full thickness tissue loss in which the base of the ulcer is covered by slough (yellow, tan, gray, green or brown) and/or eschar (tan, brown or black) in  the wound bed. (Active)  10/30/17 2300  Location: Chest  Location Orientation: Right;Upper  Staging: Unstageable - Full thickness tissue loss in which the base of the ulcer is covered by slough (yellow, tan, gray, green or brown) and/or eschar (tan, brown or black) in the wound bed.  Wound Description (Comments):   Present on Admission: Yes     Pressure Injury 10/30/17 Stage IV - Full thickness tissue loss with exposed bone, tendon or muscle. (Active)  10/30/17 2300  Location: Sacrum  Location Orientation: Right;Left  Staging: Stage IV - Full thickness tissue loss with exposed bone, tendon or muscle.  Wound Description (Comments):   Present on Admission: Yes     Pressure Injury 05/09/18 Stage IV - Full thickness tissue loss with exposed bone, tendon or muscle. Stage IV wound present on admission  (Active)  05/09/18 1020  Location: Sacrum  Location Orientation: Right;Left  Staging: Stage IV - Full thickness tissue loss with exposed bone, tendon or muscle.  Wound Description (Comments): Stage IV wound present on admission  Present on Admission: Yes                    Estimated body mass index is 24.67 kg/m as calculated from the following:   Height as of this encounter: 6' (1.829 m).   Weight as of this encounter: 82.5 kg.  DVT prophylaxis: Lovenox was stopped due to anemia blood transfusion and recent history of GI bleed SCD not ordered as patient has quadriplegia and legs contractured  Code Status: Full code Family Communication: Sister was on the phone and the speaker while I was talking to the patient Disposition Plan: Pending clinical improvement  Consultants: Wound care consult   Procedures: None Antimicrobials: None  Subjective: Abdominal pain is better eager to start eating something  Objective: Vitals:   05/09/18 2117 05/10/18 0630 05/10/18 0823 05/10/18 0915  BP: 95/65 97/65 117/66 128/76  Pulse: 92 84 94 (!) 105  Resp: 16 14 16 16   Temp: 98.9 F (37.2 C) 99.1 F (37.3 C) 98.7 F (37.1 C) 98 F (36.7 C)  TempSrc: Oral Oral Oral Oral  SpO2: 99% 95% 99% 100%  Weight:      Height:        Intake/Output Summary (Last 24 hours) at 05/10/2018 1041 Last data filed at 05/10/2018 0630 Gross per 24 hour  Intake 3699.26 ml  Output 400 ml  Net 3299.26 ml   Filed Weights   05/09/18 0639  Weight: 82.5 kg    Examination:  General exam: Appears calm and comfortable  Respiratory system: Clear to auscultation. Respiratory effort normal. Cardiovascular system: S1 & S2 heard, RRR. No JVD, murmurs, rubs, gallops or clicks. No pedal edema. Gastrointestinal system: Abdomen is nondistended, soft and nontender. No organomegaly or masses felt. Normal bowel sounds heard.  Colostomy in place with liquid stool Central nervous system: Alert and oriented.  No focal neurological deficits. Extremities: Symmetric 5 x 5 power. Skin: No rashes, lesions or ulcers Psychiatry: Judgement and insight appear normal. Mood & affect appropriate.     Data Reviewed: I have personally reviewed following labs and imaging studies  CBC: Recent Labs  Lab 05/09/18 0157 05/09/18 0901 05/10/18 0415  WBC 12.1* 9.8 8.2  NEUTROABS 8.9* 6.8  --   HGB 8.9* 7.9* 6.9*  HCT 31.2* 27.1* 24.2*  MCV 86.7 88.3 88.3  PLT 335 280 001   Basic Metabolic Panel: Recent Labs  Lab 05/09/18 0157 05/09/18 0901 05/10/18 0415  NA 145 146*  141  K 3.4* 3.3* 3.1*  CL 114* 119* 113*  CO2 20* 19* 23  GLUCOSE 83 67* 102*  BUN 49* 37* 21*  CREATININE 0.57* 0.56* 0.43*  CALCIUM 9.1 8.5* 8.4*   GFR: Estimated Creatinine Clearance: 119.9 mL/min (A) (by C-G formula based on SCr of 0.43 mg/dL (L)). Liver Function Tests: Recent Labs  Lab 05/09/18 0157 05/09/18 0901 05/10/18 0415  AST 31 27 26   ALT 19 17 17   ALKPHOS 68 61 60  BILITOT 0.6 1.0 0.6  PROT 7.5 6.4* 6.0*  ALBUMIN 3.0* 2.9* 2.5*   Recent Labs  Lab 05/09/18 0157 05/09/18 0901 05/10/18 0415  LIPASE 1,047* 296* 89*   No results for input(s): AMMONIA in the last 168 hours. Coagulation Profile: Recent Labs  Lab 05/09/18 0157  INR 1.19   Cardiac Enzymes: Recent Labs  Lab 05/09/18 0157 05/09/18 0609 05/09/18 0901  TROPONINI <0.03 <0.03 <0.03   BNP (last 3 results) No results for input(s): PROBNP in the last 8760 hours. HbA1C: No results for input(s): HGBA1C in the last 72 hours. CBG: No results for input(s): GLUCAP in the last 168 hours. Lipid Profile: Recent Labs    05/09/18 0901  TRIG 76   Thyroid Function Tests: No results for input(s): TSH, T4TOTAL, FREET4, T3FREE, THYROIDAB in the last 72 hours. Anemia Panel: No results for input(s): VITAMINB12, FOLATE, FERRITIN, TIBC, IRON, RETICCTPCT in the last 72 hours. Sepsis Labs: Recent Labs  Lab 05/09/18 0233  LATICACIDVEN 1.07     Recent Results (from the past 240 hour(s))  Blood culture (routine x 2)     Status: None (Preliminary result)   Collection Time: 05/09/18  1:57 AM  Result Value Ref Range Status   Specimen Description   Final    BLOOD RIGHT PORTA CATH Performed at [facility], [place] [address], [place], [place] [address]    Special Requests   Final    BOTTLES DRAWN AEROBIC AND ANAEROBIC Blood Culture adequate volume Performed at [facility] [address], [place], [place] [address]    Culture   Final    NO GROWTH < 12 HOURS Performed at [facility] [address], [place], [place] [address]    Report Status PENDING  Incomplete  Urine Culture     Status: Abnormal (Preliminary result)   Collection Time: 05/09/18  3:51 AM  Result Value Ref Range Status   Specimen Description   Final    URINE, RANDOM Performed at [facility] [address], [place], [place] [address]    Special Requests   Final    NONE Performed at [facility], [place] [address] [place], [place] [address]    Culture (A)  Final    >=100,000 COLONIES/mL PSEUDOMONAS AERUGINOSA SUSCEPTIBILITIES TO FOLLOW Performed at [facility], [place] [address], [place],  [address]    Report Status PENDING  Incomplete         Radiology Studies: Ct Abdomen Pelvis W Contrast  Result Date: 05/09/2018 CLINICAL DATA:  Acute onset of generalized abdominal pain and nausea. Leukocytosis. EXAM: CT ABDOMEN AND PELVIS WITH CONTRAST TECHNIQUE: Multidetector CT imaging of the abdomen and pelvis was performed using the standard protocol following bolus administration of intravenous contrast. CONTRAST:  155mL ISOVUE-300 IOPAMIDOL (ISOVUE-300) INJECTION 61% COMPARISON:  CT of the abdomen and pelvis performed 04/03/2018 FINDINGS: Lower chest: Trace bilateral pleural effusions are noted, with associated atelectasis. Mild wall thickening along the distal  esophagus may  reflect mild esophagitis or chronic inflammation. Hepatobiliary: The liver is unremarkable in appearance. The patient is status post cholecystectomy, with a clip noted at the gallbladder fossa. The common bile duct remains normal in caliber. Pancreas: Vague soft tissue inflammation is noted about the entirety of the pancreas, most prominent at the head of the pancreas, with additional soft tissue inflammation tracking about the duodenum. This is concerning for mild acute pancreatitis. There is no evidence of devascularization or pseudocyst formation at this time. Spleen: The spleen is unremarkable in appearance. Adrenals/Urinary Tract: The adrenal glands are unremarkable in appearance. The kidneys are within normal limits. There is no evidence of hydronephrosis. No renal or ureteral stones are identified. Mild nonspecific perinephric stranding is noted bilaterally. Stomach/Bowel: The stomach is unremarkable in appearance. The small bowel is within normal limits. The appendix is normal in caliber, without evidence of appendicitis. The colon is unremarkable in appearance. Vascular/Lymphatic: The abdominal aorta is unremarkable in appearance. A retroaortic left renal vein is noted. The inferior vena cava is grossly unremarkable. No retroperitoneal lymphadenopathy is seen. No pelvic sidewall lymphadenopathy is identified. Reproductive: The bladder is decompressed, with a suprapubic catheter in place. The prostate remains normal in size. Other: No additional soft tissue abnormalities are seen. Musculoskeletal: There is severe chronic resorption and deformity at the pelvis, involving both hips, with chronic sequelae of osteomyelitis, and chronic erosion of the coccyx. A large associated decubitus ulceration is noted extending into the right hip. The visualized musculature is unremarkable in appearance. IMPRESSION: 1. Vague soft tissue inflammation about the entirety of the pancreas, most prominent at the  head of the pancreas, with additional soft tissue inflammation tracking about the duodenum. This is concerning for mild acute pancreatitis. No evidence of devascularization or pseudocyst formation at this time. 2. Trace bilateral pleural effusions, with associated atelectasis. 3. Mild wall thickening along the distal esophagus may reflect mild esophagitis or chronic inflammation. 4. Severe chronic resorption and deformity at the pelvis, involving both hips, with chronic sequelae of osteomyelitis, and chronic erosion of the coccyx. Large associated decubitus ulceration extending into the right hip. Electronically Signed   By: Garald Balding M.D.   On: 05/09/2018 05:17   Dg Abdomen Acute W/chest  Result Date: 05/09/2018 CLINICAL DATA:  Mid abdominal pain for 4 days. History of small-bowel obstruction, decubitus ulcers. EXAM: DG ABDOMEN ACUTE W/ 1V CHEST COMPARISON:  Chest radiograph April 12, 2018 and CT abdomen and pelvis April 03, 2018 and CTA chest December 17, 2017. FINDINGS: Cardiac silhouette is mildly enlarged, improved from prior examination. No pleural effusion or focal consolidation. Bibasilar strandy densities improved from prior imaging. Biapical pleural thickening. No pneumothorax. Single-lumen RIGHT chest Port-A-Cath in situ. Cervical cerclage wire. Old LEFT rib fractures. Expansile lytic lesion RIGHT fifth posterior rib better characterized on CTA chest December 17, 2017. Bowel gas pattern is nondilated and nonobstructive. No intra-abdominal free air. Surgical clips in the included right abdomen compatible with cholecystectomy. Chronic destructive changes bilateral hips. Osteopenia. IMPRESSION: 1. Stable cardiomegaly.  Residual bibasilar atelectasis/scarring. 2. Redemonstration of expansile lytic RIGHT fifth rib mass. 3. Nonspecific bowel gas pattern. 4. Chronic destructive changes bilateral hips. Electronically Signed   By: Elon Alas M.D.   On: 05/09/2018 02:00        Scheduled  Meds: . sodium chloride   Intravenous Once  . baclofen  20 mg Oral QID  . ferrous sulfate  325 mg Oral TID WC  . fesoterodine  8 mg Oral Daily  . multivitamin with minerals  1 tablet Oral q morning - 10a  . nutrition supplement (JUVEN)  1 packet Oral BID BM  . pantoprazole  40 mg Oral BID  . sucralfate  1 g Oral TID AC & HS  . vitamin C  500 mg Oral Daily  . zinc sulfate  220 mg Oral Daily   Continuous Infusions:   LOS: 1 day     Georgette Shell, MD [facility]  If 7PM-7AM, please contact night-coverage [email or web address] Password [facility] 05/10/2018, 10:41 AM

## 2018-05-11 DIAGNOSIS — K8500 Idiopathic acute pancreatitis without necrosis or infection: Secondary | ICD-10-CM

## 2018-05-11 LAB — TYPE AND SCREEN
ABO/RH(D): B POS
Antibody Screen: NEGATIVE
Unit division: 0

## 2018-05-11 LAB — CBC
HCT: 27.2 % — ABNORMAL LOW (ref 39.0–52.0)
Hemoglobin: 8 g/dL — ABNORMAL LOW (ref 13.0–17.0)
MCH: 25.8 pg — ABNORMAL LOW (ref 26.0–34.0)
MCHC: 29.4 g/dL — ABNORMAL LOW (ref 30.0–36.0)
MCV: 87.7 fL (ref 80.0–100.0)
Platelets: 262 10*3/uL (ref 150–400)
RBC: 3.1 MIL/uL — ABNORMAL LOW (ref 4.22–5.81)
RDW: 15.5 % (ref 11.5–15.5)
WBC: 9.2 10*3/uL (ref 4.0–10.5)
nRBC: 0 % (ref 0.0–0.2)

## 2018-05-11 LAB — BASIC METABOLIC PANEL
Anion gap: 8 (ref 5–15)
BUN: 10 mg/dL (ref 6–20)
CO2: 21 mmol/L — ABNORMAL LOW (ref 22–32)
Calcium: 8.1 mg/dL — ABNORMAL LOW (ref 8.9–10.3)
Chloride: 109 mmol/L (ref 98–111)
Creatinine, Ser: 0.44 mg/dL — ABNORMAL LOW (ref 0.61–1.24)
GFR calc Af Amer: >60 mL/min (ref >60)
GLUCOSE: 111 mg/dL — AB (ref 70–99)
Potassium: 3.6 mmol/L (ref 3.5–5.1)
Sodium: 138 mmol/L (ref 135–145)

## 2018-05-11 LAB — BPAM RBC
Blood Product Expiration Date: 202001202359
ISSUE DATE / TIME: 201912260854
Unit Type and Rh: 7300

## 2018-05-11 MED ORDER — DAKINS (1/4 STRENGTH) 0.125 % EX SOLN: Freq: Three times a day (TID) | CUTANEOUS | 0 refills | Status: DC

## 2018-05-11 MED ORDER — HEPARIN SOD (PORK) LOCK FLUSH 100 UNIT/ML IV SOLN
500.0000 [IU] | INTRAVENOUS | Status: AC | PRN
  Administered 2018-05-11: 500 [IU]

## 2018-05-11 MED ORDER — ALUM & MAG HYDROXIDE-SIMETH 200-200-20 MG/5ML PO SUSP
30.0000 mL | ORAL | Status: DC | PRN
  Administered 2018-05-11: 30 mL via ORAL
  Filled 2018-05-11: qty 30

## 2018-05-11 NOTE — Care Management Important Message (Signed)
Important Message  Patient Details  Name: [name] [name] MRN: [id number] Date of Birth: 1965-12-03   Medicare Important Message Given:  Yes. CMA gave IM to patient Care Manager nurse.    Reeya Bound 05/11/2018, 9:04 AM

## 2018-05-11 NOTE — Discharge Summary (Signed)
Physician Discharge Summary  [name] [name] PZW:[id number] DOB: Mar 21, 1966 DOA: 05/09/2018  PCP: Andria Frames, PA-C  Admit date: 05/09/2018 Discharge date: 05/11/2018  Admitted From: HOME Disposition: HOME Recommendations for Outpatient Follow-up:  1. Follow up with PCP in 1-2 weeks 2. Please obtain BMP/CBC in one week 3. FOLLOW UP AT [facility]:RN Equipment/Devices:NONE  Discharge Condition: Stable and improved CODE STATUS full code Diet recommendation: Cardiac Brief/Interim Summary:52 y.o.malewithhistory of quadriplegia with colostomy and suprapubic catheter and sacral decubitus ulcer with chronic osteomyelitis who was admitted few months ago for GI bleed EGD showed erosive esophagitis and patient was placed on PPI and Carafate. Colonoscopy at that time was negative. Patient comes to the ER today because of increasing pain in the epigastric area denies any chest pain denies any shortness of breath denies any fever chills had some nausea denies any vomiting. Patient symptoms have been persistent for last 4 days.  ED Course:In the ER patient's labs showed elevated lipase of thousand with CAT scan showing pancreatitis. Given the symptoms patient was given fluids and admitted for further management. Patient states he has had cholecystectomy previously and there is no definite evidence of any CBD stones at this time. Patient states he has not had any alcohol since June of this year and prior to that he has had alcohol very seldom. Discharge Diagnoses:  Principal Problem:   Acute pancreatitis Active Problems:   Sacral decubitus ulcer, stage IV ([name]   S/P colostomy ([name]   OSA on CPAP   Anemia of chronic disease   Quadriplegia, C5-C7, complete ([name]   UTI (urinary tract infection) due to urinary indwelling catheter ([name]   Epigastric pain 1. Acute pancreatitis-cause not clear. Has had previous cholecystectomy and patient is nonalcoholic.triglyceride  level is only 76.  Able to tolerate a regular diet upon discharge.  He will follow-up with his PCP upon discharge.   2. History of GI bleed with esophagitis on PPI.   He is status post 1 unit of blood transfusion hemoglobin stable on discharge.  3. Stage IV sacral decubitus-appreciate wound care input.  They recommend Dakin's 3 times a day and clean the wound  For 3 days.  4. Sleep apnea on CPAP.  5. Anemia of chronic disease patient received 1 unit of blood transfusion during this hospital stay.  6. UA shows features concerning for UTI but patient has chronic indwelling catheter likely colonization.Patient is afebrile.  7. Quadriplegia.   Pressure Injury 10/03/17 Stage II -  Partial thickness loss of dermis presenting as a shallow open ulcer with a red, pink wound bed without slough. (Active)  10/03/17 0130  Location: Leg  Location Orientation: Right;Lower;Lateral  Staging: Stage II -  Partial thickness loss of dermis presenting as a shallow open ulcer with a red, pink wound bed without slough.  Wound Description (Comments):   Present on Admission: Yes     Pressure Injury 08/27/16 Stage II -  Partial thickness loss of dermis presenting as a shallow open ulcer with a red, pink wound bed without slough. (Active)  08/27/16 1610  Location: Knee  Location Orientation: Left;Lateral  Staging: Stage II -  Partial thickness loss of dermis presenting as a shallow open ulcer with a red, pink wound bed without slough.  Wound Description (Comments):   Present on Admission: Yes     Pressure Injury 08/27/16 Unstageable - Full thickness tissue loss in which the base of the ulcer is covered by slough (yellow, tan, gray, green or  brown) and/or eschar (tan, brown or black) in the wound bed. (Active)  08/27/16 1610  Location: Leg  Location Orientation: Left;Medial  Staging: Unstageable - Full thickness tissue loss in which the base of the ulcer is covered by slough (yellow, tan, gray, green or  brown) and/or eschar (tan, brown or black) in the wound bed.  Wound Description (Comments):   Present on Admission: Yes     Pressure Injury 10/03/17 Stage IV - Full thickness tissue loss with exposed bone, tendon or muscle. pale, pink nongranulating wound bed (Active)  10/03/17 0130  Location: Ischial tuberosity  Location Orientation: Left  Staging: Stage IV - Full thickness tissue loss with exposed bone, tendon or muscle.  Wound Description (Comments): pale, pink nongranulating wound bed  Present on Admission: Yes     Pressure Injury 07/19/17 Stage IV - Full thickness tissue loss with exposed bone, tendon or muscle. pale, clean, pink, non granulating wound bed (Active)  07/19/17 1200  Location: Sacrum  Location Orientation: Right;Left  Staging: Stage IV - Full thickness tissue loss with exposed bone, tendon or muscle.  Wound Description (Comments): pale, clean, pink, non granulating wound bed  Present on Admission: Yes     Pressure Injury 07/19/17 Unstageable - Full thickness tissue loss in which the base of the ulcer is covered by slough (yellow, tan, gray, green or brown) and/or eschar (tan, brown or black) in the wound bed. pink with gray devitalized tissue to perimeter (Active)  07/19/17 1200  Location: Chest  Location Orientation: Right;Lateral  Staging: Unstageable - Full thickness tissue loss in which the base of the ulcer is covered by slough (yellow, tan, gray, green or brown) and/or eschar (tan, brown or black) in the wound bed.  Wound Description (Comments): pink with gray devitalized tissue to perimeter, wound bed slick and shiny  Present on Admission: Yes     Pressure Injury 07/19/17 Stage IV - Full thickness tissue loss with exposed bone, tendon or muscle. pale, clean, pink, non granular, slick in apperance (Active)  07/19/17 1200  Location: Other (Comment)  Location Orientation: Left  Staging: Stage IV - Full thickness tissue loss with exposed bone, tendon or muscle.   Wound Description (Comments): pale, clean, pink, non granular, slick in apperance  Present on Admission: Yes     Pressure Injury 10/03/17 Stage III -  Full thickness tissue loss. Subcutaneous fat may be visible but bone, tendon or muscle are NOT exposed. (Active)  10/03/17 0130  Location: Back  Location Orientation: Right  Staging: Stage III -  Full thickness tissue loss. Subcutaneous fat may be visible but bone, tendon or muscle are NOT exposed.  Wound Description (Comments):   Present on Admission: Yes     Pressure Injury 10/03/17 Stage II -  Partial thickness loss of dermis presenting as a shallow open ulcer with a red, pink wound bed without slough. dime size (Active)  10/03/17 0130  Location: Heel  Location Orientation: Right  Staging: Stage II -  Partial thickness loss of dermis presenting as a shallow open ulcer with a red, pink wound bed without slough.  Wound Description (Comments): dime size  Present on Admission: Yes     Pressure Injury 10/30/17 Unstageable - Full thickness tissue loss in which the base of the ulcer is covered by slough (yellow, tan, gray, green or brown) and/or eschar (tan, brown or black) in the wound bed. (Active)  10/30/17 2300  Location: Chest  Location Orientation: Right;Upper  Staging: Unstageable - Full thickness tissue loss  in which the base of the ulcer is covered by slough (yellow, tan, gray, green or brown) and/or eschar (tan, brown or black) in the wound bed.  Wound Description (Comments):   Present on Admission: Yes     Pressure Injury 10/30/17 Stage IV - Full thickness tissue loss with exposed bone, tendon or muscle. (Active)  10/30/17 2300  Location: Sacrum  Location Orientation: Right;Left  Staging: Stage IV - Full thickness tissue loss with exposed bone, tendon or muscle.  Wound Description (Comments):   Present on Admission: Yes     Pressure Injury 05/09/18 Stage IV - Full thickness tissue loss with exposed bone, tendon or muscle.  Stage IV wound present on admission (Active)  05/09/18 1020  Location: Sacrum  Location Orientation: Right;Left  Staging: Stage IV - Full thickness tissue loss with exposed bone, tendon or muscle.  Wound Description (Comments): Stage IV wound present on admission  Present on Admission: Yes   Estimated body mass index is 26.4 kg/m as calculated from the following:   Height as of this encounter: 6' (1.829 m).   Weight as of this encounter: 88.3 kg.  Discharge Instructions   Allergies as of 05/11/2018      Reactions   Feraheme [ferumoxytol] Other (See Comments)   SYNCOPE   Ditropan [oxybutynin] Other (See Comments)   Hallucinations    Vancomycin Other (See Comments)   ARF 05-2016 -- affects kidneys      Medication List    STOP taking these medications   azithromycin 250 MG tablet Commonly known as:  ZITHROMAX   metoCLOPramide 10 MG tablet Commonly known as:  REGLAN   polyethylene glycol packet Commonly known as:  MIRALAX / GLYCOLAX     TAKE these medications   baclofen 20 MG tablet Commonly known as:  LIORESAL Take 20 mg by mouth 4 (four) times daily.   ferrous sulfate 325 (65 FE) MG tablet TAKE 1 TABLET (325 MG TOTAL) BY MOUTH 3 (THREE) TIMES DAILY WITH MEALS. What changed:  See the new instructions.   multivitamin with minerals Tabs tablet Take 1 tablet by mouth every morning.   nutrition supplement (JUVEN) Pack Take 1 packet by mouth 2 (two) times daily between meals.   ondansetron 8 MG disintegrating tablet Commonly known as:  ZOFRAN ODT Take 1 tablet (8 mg total) by mouth every 8 (eight) hours as needed for nausea or vomiting.   pantoprazole 40 MG tablet Commonly known as:  PROTONIX Take 40 mg by mouth 2 (two) times daily.   sodium hypochlorite 0.125 % Soln Commonly known as:  DAKIN'S 1/4 STRENGTH Irrigate with as directed 3 (three) times daily.   sucralfate 1 g tablet Commonly known as:  CARAFATE Take 1 tablet (1 g total) by mouth 4 (four) times  daily.   TOVIAZ 8 MG Tb24 tablet Generic drug:  fesoterodine Take 8 mg by mouth daily.   vitamin C 500 MG tablet Commonly known as:  ASCORBIC ACID Take 500 mg by mouth daily.   Zinc 50 MG Tabs Take 50 mg by mouth 2 (two) times daily.       Allergies  Allergen Reactions  . Feraheme [Ferumoxytol] Other (See Comments)    SYNCOPE  . Ditropan [Oxybutynin] Other (See Comments)    Hallucinations   . Vancomycin Other (See Comments)    ARF 05-2016 -- affects kidneys     Consultations: NONE  Procedures/Studies: Dg Chest 2 View  Result Date: 04/12/2018 CLINICAL DATA:  Fever and cough. Acute respiratory failure. Quadriplegia.  EXAM: CHEST - 2 VIEW COMPARISON:  02/05/2018 and chest CT from 12/17/2017 FINDINGS: Stable position of the right jugular Port-A-Cath with the tip in the SVC region. Again noted is scoliosis in the thoracolumbar spine. Again noted is a large expansile lesion involving the posterior right fifth rib. Old fractures and chronic changes in the left ribs. Cardiac silhouette is enlarged but stable. Slightly increased densities in the right lower chest and right infrahilar region. Cannot exclude airspace disease in the right lower lung. Lateral views are markedly limited. Surgical changes in the cervical spine. IMPRESSION: Slightly increased densities in the right lower chest. Findings are nonspecific but could be related to volume loss or developing infection. Stable chronic bone changes as described. Again noted is an expansile lesion of the right fifth rib. Electronically Signed   By: Markus Daft M.D.   On: 04/12/2018 15:00   Ct Abdomen Pelvis W Contrast  Result Date: 05/09/2018 CLINICAL DATA:  Acute onset of generalized abdominal pain and nausea. Leukocytosis. EXAM: CT ABDOMEN AND PELVIS WITH CONTRAST TECHNIQUE: Multidetector CT imaging of the abdomen and pelvis was performed using the standard protocol following bolus administration of intravenous contrast. CONTRAST:  174mL  ISOVUE-300 IOPAMIDOL (ISOVUE-300) INJECTION 61% COMPARISON:  CT of the abdomen and pelvis performed 04/03/2018 FINDINGS: Lower chest: Trace bilateral pleural effusions are noted, with associated atelectasis. Mild wall thickening along the distal esophagus may reflect mild esophagitis or chronic inflammation. Hepatobiliary: The liver is unremarkable in appearance. The patient is status post cholecystectomy, with a clip noted at the gallbladder fossa. The common bile duct remains normal in caliber. Pancreas: Vague soft tissue inflammation is noted about the entirety of the pancreas, most prominent at the head of the pancreas, with additional soft tissue inflammation tracking about the duodenum. This is concerning for mild acute pancreatitis. There is no evidence of devascularization or pseudocyst formation at this time. Spleen: The spleen is unremarkable in appearance. Adrenals/Urinary Tract: The adrenal glands are unremarkable in appearance. The kidneys are within normal limits. There is no evidence of hydronephrosis. No renal or ureteral stones are identified. Mild nonspecific perinephric stranding is noted bilaterally. Stomach/Bowel: The stomach is unremarkable in appearance. The small bowel is within normal limits. The appendix is normal in caliber, without evidence of appendicitis. The colon is unremarkable in appearance. Vascular/Lymphatic: The abdominal aorta is unremarkable in appearance. A retroaortic left renal vein is noted. The inferior vena cava is grossly unremarkable. No retroperitoneal lymphadenopathy is seen. No pelvic sidewall lymphadenopathy is identified. Reproductive: The bladder is decompressed, with a suprapubic catheter in place. The prostate remains normal in size. Other: No additional soft tissue abnormalities are seen. Musculoskeletal: There is severe chronic resorption and deformity at the pelvis, involving both hips, with chronic sequelae of osteomyelitis, and chronic erosion of the  coccyx. A large associated decubitus ulceration is noted extending into the right hip. The visualized musculature is unremarkable in appearance. IMPRESSION: 1. Vague soft tissue inflammation about the entirety of the pancreas, most prominent at the head of the pancreas, with additional soft tissue inflammation tracking about the duodenum. This is concerning for mild acute pancreatitis. No evidence of devascularization or pseudocyst formation at this time. 2. Trace bilateral pleural effusions, with associated atelectasis. 3. Mild wall thickening along the distal esophagus may reflect mild esophagitis or chronic inflammation. 4. Severe chronic resorption and deformity at the pelvis, involving both hips, with chronic sequelae of osteomyelitis, and chronic erosion of the coccyx. Large associated decubitus ulceration extending into the right hip. Electronically  Signed   By: Garald Balding M.D.   On: 05/09/2018 05:17   Dg Abdomen Acute W/chest  Result Date: 05/09/2018 CLINICAL DATA:  Mid abdominal pain for 4 days. History of small-bowel obstruction, decubitus ulcers. EXAM: DG ABDOMEN ACUTE W/ 1V CHEST COMPARISON:  Chest radiograph April 12, 2018 and CT abdomen and pelvis April 03, 2018 and CTA chest December 17, 2017. FINDINGS: Cardiac silhouette is mildly enlarged, improved from prior examination. No pleural effusion or focal consolidation. Bibasilar strandy densities improved from prior imaging. Biapical pleural thickening. No pneumothorax. Single-lumen RIGHT chest Port-A-Cath in situ. Cervical cerclage wire. Old LEFT rib fractures. Expansile lytic lesion RIGHT fifth posterior rib better characterized on CTA chest December 17, 2017. Bowel gas pattern is nondilated and nonobstructive. No intra-abdominal free air. Surgical clips in the included right abdomen compatible with cholecystectomy. Chronic destructive changes bilateral hips. Osteopenia. IMPRESSION: 1. Stable cardiomegaly.  Residual bibasilar  atelectasis/scarring. 2. Redemonstration of expansile lytic RIGHT fifth rib mass. 3. Nonspecific bowel gas pattern. 4. Chronic destructive changes bilateral hips. Electronically Signed   By: Elon Alas M.D.   On: 05/09/2018 02:00   (Echo, Carotid, EGD, Colonoscopy, ERCP)    Subjective:   Discharge Exam: Vitals:   05/10/18 2136 05/11/18 0448  BP:  138/78  Pulse:  93  Resp:  20  Temp: 99.1 F (37.3 C) 99.3 F (37.4 C)  SpO2:  100%   Vitals:   05/10/18 2028 05/10/18 2136 05/11/18 0448 05/11/18 0502  BP: 128/81  138/78   Pulse: (!) 102  93   Resp: (!) 22  20   Temp: 99.8 F (37.7 C) 99.1 F (37.3 C) 99.3 F (37.4 C)   TempSrc: Oral Oral Oral   SpO2: 100%  100%   Weight:    88.3 kg  Height:        General: Pt is alert, awake, not in acute distress Cardiovascular: RRR, S1/S2 +, no rubs, no gallops Respiratory: CTA bilaterally, no wheezing, no rhonchi Abdominal: Soft, NT, ND, bowel sounds +Peg in place Extremities: CONTRACTED  The results of significant diagnostics from this hospitalization (including imaging, microbiology, ancillary and laboratory) are listed below for reference.     Microbiology: Recent Results (from the past 240 hour(s))  Blood culture (routine x 2)     Status: None (Preliminary result)   Collection Time: 05/09/18  1:57 AM  Result Value Ref Range Status   Specimen Description   Final    BLOOD LEFT HAND Performed at [facility], [place] [address], [place], [place] [address]    Special Requests   Final    BOTTLES DRAWN AEROBIC ONLY Blood Culture results may not be optimal due to an inadequate volume of blood received in culture bottles Performed at [facility] [address] [place], [place] [address]    Culture   Final    NO GROWTH < 24 HOURS Performed at [facility] [address], [place], [place] [address]    Report Status PENDING  Incomplete  Blood culture (routine x 2)     Status: None  (Preliminary result)   Collection Time: 05/09/18  1:57 AM  Result Value Ref Range Status   Specimen Description   Final    BLOOD RIGHT PORTA CATH Performed at [facility] [address] [place], [place] [address]    Special Requests   Final    BOTTLES DRAWN AEROBIC AND ANAEROBIC Blood Culture adequate volume Performed at [facility], [place] [address], [place], [place] [address]    Culture   Final    NO GROWTH 1 DAY Performed at [facility], [place] [address], [place], [place] [address]    Report Status PENDING  Incomplete  Urine Culture     Status: Abnormal (Preliminary result)   Collection Time: 05/09/18  3:51 AM  Result Value Ref Range Status   Specimen Description   Final    URINE, RANDOM Performed at [facility] [address], [place], [place] [address]    Special Requests   Final    NONE Performed at [facility], [place] [address], [place], [place] [address]    Culture (A)  Final    >=100,000 COLONIES/mL PSEUDOMONAS AERUGINOSA >=100,000 COLONIES/mL ENTEROCOCCUS FAECALIS SUSCEPTIBILITIES TO FOLLOW Performed at [facility], [place] [address] [place], [place] [address]    Report Status PENDING  Incomplete   Organism ID, Bacteria PSEUDOMONAS AERUGINOSA (A)  Final      Susceptibility   Pseudomonas aeruginosa - MIC*    CEFTAZIDIME 4 SENSITIVE Sensitive     CIPROFLOXACIN <=0.25 SENSITIVE Sensitive     GENTAMICIN <=1 SENSITIVE Sensitive     IMIPENEM 1 SENSITIVE Sensitive     PIP/TAZO <=4 SENSITIVE Sensitive     CEFEPIME 2 SENSITIVE Sensitive     * >=100,000 COLONIES/mL PSEUDOMONAS AERUGINOSA     Labs: BNP (last 3 results) No results for input(s): BNP in the last 8760 hours. Basic Metabolic Panel: Recent Labs  Lab 05/09/18 0157 05/09/18 0901 05/10/18 0415 05/11/18 0403  NA 145 146* 141 138  K 3.4* 3.3* 3.1* 3.6  CL 114* 119* 113* 109  CO2 20* 19* 23 21*  GLUCOSE 83 67* 102*  111*  BUN 49* 37* 21* 10  CREATININE 0.57* 0.56* 0.43* 0.44*  CALCIUM 9.1 8.5* 8.4* 8.1*   Liver Function Tests: Recent Labs  Lab 05/09/18 0157 05/09/18 0901 05/10/18 0415  AST 31 27 26   ALT 19 17 17   ALKPHOS 68 61 60  BILITOT 0.6 1.0 0.6  PROT 7.5 6.4* 6.0*  ALBUMIN 3.0* 2.9* 2.5*   Recent Labs  Lab 05/09/18 0157 05/09/18 0901 05/10/18 0415  LIPASE 1,047* 296* 89*   No results for input(s): AMMONIA in the last 168 hours. CBC: Recent Labs  Lab 05/09/18 0157 05/09/18 0901 05/10/18 0415 05/11/18 0403  WBC 12.1* 9.8 8.2 9.2  NEUTROABS 8.9* 6.8  --   --   HGB 8.9* 7.9* 6.9* 8.0*  HCT 31.2* 27.1* 24.2* 27.2*  MCV 86.7 88.3 88.3 87.7  PLT 335 280 271 262   Cardiac Enzymes: Recent Labs  Lab 05/09/18 0157 05/09/18 0609 05/09/18 0901  TROPONINI <0.03 <0.03 <0.03   BNP: Invalid input(s): POCBNP CBG: No results for input(s): GLUCAP in the last 168 hours. D-Dimer No results for input(s): DDIMER in the last 72 hours. Hgb A1c No results for input(s): HGBA1C in the last 72 hours. Lipid Profile Recent Labs    05/09/18 0901  TRIG 76   Thyroid function studies No results for input(s): TSH, T4TOTAL, T3FREE, THYROIDAB in the last 72 hours.  Invalid input(s): FREET3 Anemia work up No results for input(s): VITAMINB12, FOLATE, FERRITIN, TIBC, IRON, RETICCTPCT in the last 72 hours. Urinalysis    Component Value Date/Time   COLORURINE YELLOW 05/09/2018 0157   APPEARANCEUR HAZY (A) 05/09/2018 0157   LABSPEC 1.015 05/09/2018 0157   PHURINE 6.0 05/09/2018 0157   GLUCOSEU NEGATIVE 05/09/2018 0157   HGBUR  SMALL (A) 05/09/2018 0157   BILIRUBINUR NEGATIVE 05/09/2018 0157   KETONESUR 20 (A) 05/09/2018 0157   PROTEINUR NEGATIVE 05/09/2018 0157   UROBILINOGEN 1.0 03/16/2015 0835   NITRITE NEGATIVE 05/09/2018 0157   LEUKOCYTESUR LARGE (A) 05/09/2018 0157   Sepsis Labs Invalid input(s): PROCALCITONIN,  WBC,  LACTICIDVEN Microbiology Recent Results (from the past 240  hour(s))  Blood culture (routine x 2)     Status: None (Preliminary result)   Collection Time: 05/09/18  1:57 AM  Result Value Ref Range Status   Specimen Description   Final    BLOOD LEFT HAND Performed at [facility], [place] [address], [place], [place] [address]    Special Requests   Final    BOTTLES DRAWN AEROBIC ONLY Blood Culture results may not be optimal due to an inadequate volume of blood received in culture bottles Performed at [facility] [address], [place], [place] [address]    Culture   Final    NO GROWTH < 24 HOURS Performed at [facility] [address], [place], [place] [address]    Report Status PENDING  Incomplete  Blood culture (routine x 2)     Status: None (Preliminary result)   Collection Time: 05/09/18  1:57 AM  Result Value Ref Range Status   Specimen Description   Final    BLOOD RIGHT PORTA CATH Performed at [facility] [address], [place], [place] [address]    Special Requests   Final    BOTTLES DRAWN AEROBIC AND ANAEROBIC Blood Culture adequate volume Performed at [facility] [address], [place], [place] [address]    Culture   Final    NO GROWTH 1 DAY Performed at [facility], [place] [address] [place], [place] [address]    Report Status PENDING  Incomplete  Urine Culture     Status: Abnormal (Preliminary result)   Collection Time: 05/09/18  3:51 AM  Result Value Ref Range Status   Specimen Description   Final    URINE, RANDOM Performed at [facility] [address] [place], [place] [address]    Special Requests   Final    NONE Performed at [facility], [place] [address], [place], [place] [address]    Culture (A)  Final    >=100,000 COLONIES/mL PSEUDOMONAS AERUGINOSA >=100,000 COLONIES/mL ENTEROCOCCUS FAECALIS SUSCEPTIBILITIES TO FOLLOW Performed at [facility], [place] [address],  [place], [place] [address]    Report Status PENDING  Incomplete   Organism ID, Bacteria PSEUDOMONAS AERUGINOSA (A)  Final      Susceptibility   Pseudomonas aeruginosa - MIC*    CEFTAZIDIME 4 SENSITIVE Sensitive     CIPROFLOXACIN <=0.25 SENSITIVE Sensitive     GENTAMICIN <=1 SENSITIVE Sensitive     IMIPENEM 1 SENSITIVE Sensitive     PIP/TAZO <=4 SENSITIVE Sensitive     CEFEPIME 2 SENSITIVE Sensitive     * >=100,000 COLONIES/mL PSEUDOMONAS AERUGINOSA     Time coordinating discharge:34 minutes  SIGNED:   Georgette Shell, MD  [facility] 05/11/2018, 11:44 AM Pager   If 7PM-7AM, please contact night-coverage [email or web address] Password [email or web address]

## 2018-05-11 NOTE — Care Management Note (Signed)
Case Management Note  Patient Details  Name: [name] [name] MRN: [id number] Date of Birth: 1966/04/27  Subjective/Objective: d/c home w/HHC-PTAR forms in shadow chart-Nsg aware. No further CM needs.                   Action/Plan:d/c home w/HHC/PTAR.   Expected Discharge Date:  05/11/18               Expected Discharge Plan:  [place]  In-House Referral:     Discharge planning Services  CM Consult  Post Acute Care Choice:  Home Health(Active [facility]-wound care, &f/c) Choice offered to:  Patient  DME Arranged:    DME Agency:     HH Arranged:  RN [place] Agency:  [place]  Status of Service:  Completed, signed off  If discussed at [place] of Stay Meetings, dates discussed:    Additional Comments:  Dessa Phi, RN 05/11/2018, 12:06 PM

## 2018-05-11 NOTE — Care Management Note (Signed)
Case Management Note  Patient Details  Name: [name] [name] MRN: [id number] Date of Birth: 1965-05-29  Subjective/Objective:  Acute pancreatitis. From home. Lives with his sister. Active w/[facility]-HHRN-wound care-TIW, & f/c care-1x month-rep Cheryl aware.await HHRN orders, & f33f.Will need PTAR for non emergency transp home-confirmed address-patient says his sister will be home later today-PTAR to be called after 6p. Per [facility]-they will SOC within 48hrs of d/c.                  Action/Plan:dc home w/HHC/PTAR   Expected Discharge Date:  05/11/18               Expected Discharge Plan:  [place]  In-House Referral:     Discharge planning Services  CM Consult  Post Acute Care Choice:  Home Health(Active w[facility]HR[facility]-wound care, &f/c) Choice offered to:  Patient  DME Arranged:    DME Agency:     HH Arranged:  RN [place] Agency:  [place]  Status of Service:  Completed, signed off  If discussed at [place] of Stay Meetings, dates discussed:    Additional Comments:  Dessa Phi, RN 05/11/2018, 11:56 AM

## 2018-05-14 LAB — CULTURE, BLOOD (ROUTINE X 2)
Culture: NO GROWTH
Culture: NO GROWTH
Special Requests: ADEQUATE

## 2018-05-14 LAB — URINE CULTURE: Culture: >=100000 — AB

## 2018-05-18 ENCOUNTER — Observation Stay (HOSPITAL_COMMUNITY)
Admission: EM | Admit: 2018-05-18 | Discharge: 2018-05-22 | Disposition: A | Discharge status: Skilled Nursing Facility | Payer: Medicare Other | Attending: Internal Medicine | Admitting: Internal Medicine

## 2018-05-18 ENCOUNTER — Other Ambulatory Visit: Payer: Self-pay

## 2018-05-18 ENCOUNTER — Encounter (HOSPITAL_COMMUNITY): Payer: Self-pay

## 2018-05-18 DIAGNOSIS — F329 Major depressive disorder, single episode, unspecified: Secondary | ICD-10-CM | POA: Insufficient documentation

## 2018-05-18 DIAGNOSIS — L8995 Pressure ulcer of unspecified site, unstageable: Secondary | ICD-10-CM | POA: Diagnosis not present

## 2018-05-18 DIAGNOSIS — G4733 Obstructive sleep apnea (adult) (pediatric): Secondary | ICD-10-CM | POA: Diagnosis not present

## 2018-05-18 DIAGNOSIS — G8253 Quadriplegia, C5-C7 complete: Secondary | ICD-10-CM | POA: Diagnosis present

## 2018-05-18 DIAGNOSIS — L89224 Pressure ulcer of left hip, stage 4: Secondary | ICD-10-CM | POA: Insufficient documentation

## 2018-05-18 DIAGNOSIS — K210 Gastro-esophageal reflux disease with esophagitis: Secondary | ICD-10-CM | POA: Insufficient documentation

## 2018-05-18 DIAGNOSIS — L89612 Pressure ulcer of right heel, stage 2: Secondary | ICD-10-CM | POA: Diagnosis not present

## 2018-05-18 DIAGNOSIS — I10 Essential (primary) hypertension: Secondary | ICD-10-CM | POA: Diagnosis not present

## 2018-05-18 DIAGNOSIS — L89154 Pressure ulcer of sacral region, stage 4: Principal | ICD-10-CM | POA: Insufficient documentation

## 2018-05-18 DIAGNOSIS — L89113 Pressure ulcer of right upper back, stage 3: Secondary | ICD-10-CM | POA: Diagnosis not present

## 2018-05-18 DIAGNOSIS — L89890 Pressure ulcer of other site, unstageable: Secondary | ICD-10-CM | POA: Insufficient documentation

## 2018-05-18 DIAGNOSIS — L89519 Pressure ulcer of right ankle, unspecified stage: Secondary | ICD-10-CM | POA: Diagnosis not present

## 2018-05-18 DIAGNOSIS — D638 Anemia in other chronic diseases classified elsewhere: Secondary | ICD-10-CM | POA: Diagnosis not present

## 2018-05-18 DIAGNOSIS — M8660 Other chronic osteomyelitis, unspecified site: Secondary | ICD-10-CM | POA: Diagnosis not present

## 2018-05-18 DIAGNOSIS — R110 Nausea: Secondary | ICD-10-CM | POA: Diagnosis not present

## 2018-05-18 DIAGNOSIS — L97513 Non-pressure chronic ulcer of other part of right foot with necrosis of muscle: Secondary | ICD-10-CM | POA: Diagnosis not present

## 2018-05-18 DIAGNOSIS — E876 Hypokalemia: Secondary | ICD-10-CM | POA: Diagnosis present

## 2018-05-18 DIAGNOSIS — L089 Local infection of the skin and subcutaneous tissue, unspecified: Secondary | ICD-10-CM | POA: Insufficient documentation

## 2018-05-18 DIAGNOSIS — L89104 Pressure ulcer of unspecified part of back, stage 4: Secondary | ICD-10-CM | POA: Diagnosis not present

## 2018-05-18 DIAGNOSIS — Z9989 Dependence on other enabling machines and devices: Secondary | ICD-10-CM

## 2018-05-18 DIAGNOSIS — L89613 Pressure ulcer of right heel, stage 3: Secondary | ICD-10-CM | POA: Diagnosis not present

## 2018-05-18 DIAGNOSIS — L89300 Pressure ulcer of unspecified buttock, unstageable: Secondary | ICD-10-CM | POA: Diagnosis not present

## 2018-05-18 DIAGNOSIS — L8990 Pressure ulcer of unspecified site, unspecified stage: Secondary | ICD-10-CM

## 2018-05-18 LAB — CBC WITH DIFFERENTIAL/PLATELET
Abs Immature Granulocytes: 0.06 10*3/uL (ref 0.00–0.07)
Basophils Absolute: 0 10*3/uL (ref 0.0–0.1)
Basophils Relative: 0 %
Eosinophils Absolute: 0.3 10*3/uL (ref 0.0–0.5)
Eosinophils Relative: 2 %
HCT: 29.6 % — ABNORMAL LOW (ref 39.0–52.0)
HEMOGLOBIN: 8.6 g/dL — AB (ref 13.0–17.0)
Immature Granulocytes: 1 %
Lymphocytes Relative: 12 %
Lymphs Abs: 1.5 10*3/uL (ref 0.7–4.0)
MCH: 24.7 pg — ABNORMAL LOW (ref 26.0–34.0)
MCHC: 29.1 g/dL — ABNORMAL LOW (ref 30.0–36.0)
MCV: 85.1 fL (ref 80.0–100.0)
Monocytes Absolute: 1 10*3/uL (ref 0.1–1.0)
Monocytes Relative: 8 %
Neutro Abs: 9.7 10*3/uL — ABNORMAL HIGH (ref 1.7–7.7)
Neutrophils Relative %: 77 %
Platelets: 399 10*3/uL (ref 150–400)
RBC: 3.48 MIL/uL — AB (ref 4.22–5.81)
RDW: 14.7 % (ref 11.5–15.5)
WBC: 12.6 10*3/uL — ABNORMAL HIGH (ref 4.0–10.5)
nRBC: 0 % (ref 0.0–0.2)

## 2018-05-18 LAB — BASIC METABOLIC PANEL
Anion gap: 13 (ref 5–15)
BUN: 8 mg/dL (ref 6–20)
CO2: 23 mmol/L (ref 22–32)
Calcium: 8.3 mg/dL — ABNORMAL LOW (ref 8.9–10.3)
Chloride: 104 mmol/L (ref 98–111)
Creatinine, Ser: 0.39 mg/dL — ABNORMAL LOW (ref 0.61–1.24)
GFR calc Af Amer: >60 mL/min (ref >60)
GFR calc non Af Amer: >60 mL/min (ref >60)
Glucose, Bld: 87 mg/dL (ref 70–99)
Potassium: 3 mmol/L — ABNORMAL LOW (ref 3.5–5.1)
SODIUM: 140 mmol/L (ref 135–145)

## 2018-05-18 LAB — I-STAT CG4 LACTIC ACID, ED: Lactic Acid, Venous: 0.75 mmol/L (ref 0.5–1.9)

## 2018-05-18 MED ORDER — PIPERACILLIN-TAZOBACTAM 3.375 G IVPB 30 MIN
3.3750 g | Freq: Once | INTRAVENOUS | Status: AC
  Administered 2018-05-19: 3.375 g via INTRAVENOUS
  Filled 2018-05-18: qty 50

## 2018-05-18 MED ORDER — SODIUM CHLORIDE 0.9 % IV SOLN
1000.0000 mL | INTRAVENOUS | Status: DC
  Administered 2018-05-19: 1000 mL via INTRAVENOUS

## 2018-05-18 MED ORDER — HYDROCODONE-ACETAMINOPHEN 5-325 MG PO TABS
2.0000 | ORAL_TABLET | Freq: Once | ORAL | Status: AC
  Administered 2018-05-19: 2 via ORAL
  Filled 2018-05-18: qty 2

## 2018-05-18 MED ORDER — SODIUM CHLORIDE 0.9 % IV BOLUS (SEPSIS)
500.0000 mL | Freq: Once | INTRAVENOUS | Status: AC
  Administered 2018-05-19: 500 mL via INTRAVENOUS

## 2018-05-18 NOTE — ED Notes (Signed)
Bed: WA02 Expected date:  Expected time:  Means of arrival:  Comments: EMS-[facility]

## 2018-05-18 NOTE — ED Notes (Signed)
Wounds redressed. 

## 2018-05-18 NOTE — ED Triage Notes (Signed)
Per EMS, patient from home, c/o worsening pressure ulcers to back and buttocks. Patient is a quadriplegic. Seen and admitted on 12/25 for same.  BP 120/78

## 2018-05-18 NOTE — ED Triage Notes (Signed)
Pt reports worsening decubitus ulcers on his L hip, back, and buttocks. He reports a foul odor and low grade fever starting 2 days ago. Pt also endorses neck pain and stiffness. A&Ox4.

## 2018-05-18 NOTE — ED Notes (Signed)
Lab contacted to collect blood from pt.
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
## 2018-05-18 NOTE — ED Notes (Signed)
Pt refusing to be stuck. Requesting port be accessed.

## 2018-05-19 ENCOUNTER — Encounter (HOSPITAL_COMMUNITY): Payer: Self-pay | Admitting: Family Medicine

## 2018-05-19 DIAGNOSIS — E876 Hypokalemia: Secondary | ICD-10-CM | POA: Diagnosis not present

## 2018-05-19 DIAGNOSIS — L089 Local infection of the skin and subcutaneous tissue, unspecified: Secondary | ICD-10-CM | POA: Diagnosis present

## 2018-05-19 DIAGNOSIS — D638 Anemia in other chronic diseases classified elsewhere: Secondary | ICD-10-CM | POA: Diagnosis not present

## 2018-05-19 DIAGNOSIS — L8990 Pressure ulcer of unspecified site, unspecified stage: Secondary | ICD-10-CM

## 2018-05-19 DIAGNOSIS — G8253 Quadriplegia, C5-C7 complete: Secondary | ICD-10-CM

## 2018-05-19 DIAGNOSIS — L8995 Pressure ulcer of unspecified site, unstageable: Secondary | ICD-10-CM | POA: Diagnosis not present

## 2018-05-19 LAB — HEPATIC FUNCTION PANEL
ALT: 12 U/L (ref 0–44)
AST: 15 U/L (ref 15–41)
Albumin: 2.6 g/dL — ABNORMAL LOW (ref 3.5–5.0)
Alkaline Phosphatase: 61 U/L (ref 38–126)
Bilirubin, Direct: <0.1 mg/dL (ref 0.0–0.2)
Total Bilirubin: 0.5 mg/dL (ref 0.3–1.2)
Total Protein: 7.3 g/dL (ref 6.5–8.1)

## 2018-05-19 LAB — CBC WITH DIFFERENTIAL/PLATELET
Abs Immature Granulocytes: 0.07 10*3/uL (ref 0.00–0.07)
Basophils Absolute: 0 10*3/uL (ref 0.0–0.1)
Basophils Relative: 0 %
EOS ABS: 0.4 10*3/uL (ref 0.0–0.5)
Eosinophils Relative: 4 %
HCT: 26.6 % — ABNORMAL LOW (ref 39.0–52.0)
Hemoglobin: 7.9 g/dL — ABNORMAL LOW (ref 13.0–17.0)
Immature Granulocytes: 1 %
Lymphocytes Relative: 7 %
Lymphs Abs: 0.8 10*3/uL (ref 0.7–4.0)
MCH: 25.6 pg — ABNORMAL LOW (ref 26.0–34.0)
MCHC: 29.7 g/dL — ABNORMAL LOW (ref 30.0–36.0)
MCV: 86.4 fL (ref 80.0–100.0)
Monocytes Absolute: 0.8 10*3/uL (ref 0.1–1.0)
Monocytes Relative: 7 %
NEUTROS PCT: 81 %
Neutro Abs: 9 10*3/uL — ABNORMAL HIGH (ref 1.7–7.7)
Platelets: 334 10*3/uL (ref 150–400)
RBC: 3.08 MIL/uL — ABNORMAL LOW (ref 4.22–5.81)
RDW: 14.8 % (ref 11.5–15.5)
WBC: 11.1 10*3/uL — ABNORMAL HIGH (ref 4.0–10.5)
nRBC: 0 % (ref 0.0–0.2)

## 2018-05-19 LAB — BASIC METABOLIC PANEL
Anion gap: 8 (ref 5–15)
BUN: 11 mg/dL (ref 6–20)
CALCIUM: 7.8 mg/dL — AB (ref 8.9–10.3)
CO2: 26 mmol/L (ref 22–32)
Chloride: 105 mmol/L (ref 98–111)
Creatinine, Ser: <0.3 mg/dL — ABNORMAL LOW (ref 0.61–1.24)
Glucose, Bld: 109 mg/dL — ABNORMAL HIGH (ref 70–99)
Potassium: 3.1 mmol/L — ABNORMAL LOW (ref 3.5–5.1)
Sodium: 139 mmol/L (ref 135–145)

## 2018-05-19 LAB — I-STAT CG4 LACTIC ACID, ED: Lactic Acid, Venous: 1.32 mmol/L (ref 0.5–1.9)

## 2018-05-19 LAB — LIPASE, BLOOD: Lipase: 37 U/L (ref 11–51)

## 2018-05-19 LAB — MAGNESIUM: Magnesium: 1.2 mg/dL — ABNORMAL LOW (ref 1.7–2.4)

## 2018-05-19 MED ORDER — PANTOPRAZOLE SODIUM 40 MG PO TBEC
40.0000 mg | DELAYED_RELEASE_TABLET | Freq: Two times a day (BID) | ORAL | Status: DC
  Administered 2018-05-19 – 2018-05-22 (×7): 40 mg via ORAL
  Filled 2018-05-19 (×7): qty 1

## 2018-05-19 MED ORDER — SUCRALFATE 1 G PO TABS
1.0000 g | ORAL_TABLET | Freq: Four times a day (QID) | ORAL | Status: DC
  Administered 2018-05-19 – 2018-05-22 (×12): 1 g via ORAL
  Filled 2018-05-19 (×12): qty 1

## 2018-05-19 MED ORDER — ONDANSETRON HCL 4 MG/2ML IJ SOLN
4.0000 mg | Freq: Four times a day (QID) | INTRAMUSCULAR | Status: DC | PRN
  Administered 2018-05-19: 4 mg via INTRAVENOUS
  Filled 2018-05-19: qty 2

## 2018-05-19 MED ORDER — SODIUM CHLORIDE 0.9 % IV SOLN
2.0000 g | Freq: Two times a day (BID) | INTRAVENOUS | Status: DC
  Administered 2018-05-19 – 2018-05-21 (×6): 2 g via INTRAVENOUS
  Filled 2018-05-19 (×9): qty 2

## 2018-05-19 MED ORDER — POTASSIUM CHLORIDE 10 MEQ/100ML IV SOLN
10.0000 meq | INTRAVENOUS | Status: AC
  Administered 2018-05-19 (×3): 10 meq via INTRAVENOUS
  Filled 2018-05-19 (×3): qty 100

## 2018-05-19 MED ORDER — SODIUM CHLORIDE 0.9% FLUSH: 10.0000 mL | INTRAVENOUS | Status: DC | PRN

## 2018-05-19 MED ORDER — HYDROCODONE-ACETAMINOPHEN 5-325 MG PO TABS: 1.0000 | ORAL_TABLET | ORAL | Status: DC | PRN

## 2018-05-19 MED ORDER — SODIUM CHLORIDE 0.9 % IV SOLN
250.0000 mL | INTRAVENOUS | Status: DC | PRN
  Administered 2018-05-19: 250 mL via INTRAVENOUS

## 2018-05-19 MED ORDER — FESOTERODINE FUMARATE ER 8 MG PO TB24
8.0000 mg | ORAL_TABLET | Freq: Every day | ORAL | Status: DC
  Administered 2018-05-19 – 2018-05-22 (×4): 8 mg via ORAL
  Filled 2018-05-19 (×4): qty 1

## 2018-05-19 MED ORDER — ADULT MULTIVITAMIN W/MINERALS CH
1.0000 | ORAL_TABLET | Freq: Every morning | ORAL | Status: DC
  Administered 2018-05-19 – 2018-05-22 (×4): 1 via ORAL
  Filled 2018-05-19 (×4): qty 1

## 2018-05-19 MED ORDER — ACETAMINOPHEN 650 MG RE SUPP: 650.0000 mg | Freq: Four times a day (QID) | RECTAL | Status: DC | PRN

## 2018-05-19 MED ORDER — ZINC SULFATE 220 (50 ZN) MG PO CAPS
220.0000 mg | ORAL_CAPSULE | Freq: Two times a day (BID) | ORAL | Status: DC
  Administered 2018-05-19 – 2018-05-22 (×7): 220 mg via ORAL
  Filled 2018-05-19 (×7): qty 1

## 2018-05-19 MED ORDER — VITAMIN C 500 MG PO TABS
500.0000 mg | ORAL_TABLET | Freq: Every day | ORAL | Status: DC
  Administered 2018-05-19 – 2018-05-22 (×4): 500 mg via ORAL
  Filled 2018-05-19 (×4): qty 1

## 2018-05-19 MED ORDER — VANCOMYCIN HCL 10 G IV SOLR
1500.0000 mg | Freq: Once | INTRAVENOUS | Status: DC
  Administered 2018-05-19: 1500 mg via INTRAVENOUS
  Filled 2018-05-19: qty 1500

## 2018-05-19 MED ORDER — ACETAMINOPHEN 325 MG PO TABS
650.0000 mg | ORAL_TABLET | Freq: Four times a day (QID) | ORAL | Status: DC | PRN
  Administered 2018-05-21: 650 mg via ORAL
  Filled 2018-05-19: qty 2

## 2018-05-19 MED ORDER — POTASSIUM CHLORIDE CRYS ER 20 MEQ PO TBCR
20.0000 meq | EXTENDED_RELEASE_TABLET | Freq: Two times a day (BID) | ORAL | Status: DC
  Administered 2018-05-19 – 2018-05-21 (×6): 20 meq via ORAL
  Filled 2018-05-19 (×6): qty 1

## 2018-05-19 MED ORDER — METRONIDAZOLE IN NACL 5-0.79 MG/ML-% IV SOLN
500.0000 mg | Freq: Three times a day (TID) | INTRAVENOUS | Status: DC
  Administered 2018-05-19 – 2018-05-22 (×9): 500 mg via INTRAVENOUS
  Filled 2018-05-19 (×10): qty 100

## 2018-05-19 MED ORDER — SODIUM CHLORIDE 0.9% FLUSH: 3.0000 mL | Freq: Two times a day (BID) | INTRAVENOUS | Status: DC

## 2018-05-19 MED ORDER — SODIUM CHLORIDE 0.9% FLUSH: 3.0000 mL | INTRAVENOUS | Status: DC | PRN

## 2018-05-19 MED ORDER — JUVEN PO PACK
1.0000 | PACK | Freq: Two times a day (BID) | ORAL | Status: DC
  Administered 2018-05-19 – 2018-05-22 (×6): 1 via ORAL
  Filled 2018-05-19 (×8): qty 1

## 2018-05-19 MED ORDER — HEPARIN SOD (PORK) LOCK FLUSH 100 UNIT/ML IV SOLN
500.0000 [IU] | Freq: Once | INTRAVENOUS | Status: AC
  Administered 2018-05-19: 500 [IU]
  Filled 2018-05-19: qty 5

## 2018-05-19 MED ORDER — BISACODYL 5 MG PO TBEC: 5.0000 mg | DELAYED_RELEASE_TABLET | Freq: Every day | ORAL | Status: DC | PRN

## 2018-05-19 MED ORDER — BACLOFEN 20 MG PO TABS
20.0000 mg | ORAL_TABLET | Freq: Four times a day (QID) | ORAL | Status: DC
  Administered 2018-05-19 – 2018-05-22 (×12): 20 mg via ORAL
  Filled 2018-05-19 (×12): qty 1

## 2018-05-19 MED ORDER — ONDANSETRON HCL 4 MG PO TABS: 4.0000 mg | ORAL_TABLET | Freq: Four times a day (QID) | ORAL | Status: DC | PRN

## 2018-05-19 MED ORDER — VANCOMYCIN HCL IN DEXTROSE 1-5 GM/200ML-% IV SOLN: 1000.0000 mg | Freq: Two times a day (BID) | INTRAVENOUS | Status: DC

## 2018-05-19 NOTE — Plan of Care (Signed)
[name] was admitted early this morning by my partner with chronic wounds.  Home health RN thought the wound was infected with increased drainage and foul order.  Patient was recently discharged from the hospital after being admitted for pancreatitis.  Awaiting wound care input to see if he needs any kind of debridement.  Continue cefepime.  Vanco has been stopped due to previous history of acute renal failure.
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
## 2018-05-19 NOTE — ED Notes (Signed)
Pt expressed some concerns regarding his vancomycin dose. This writer advised the pt that the MD and pharmacist consulted one another on dosing and benefits vs risks. This Probation officer also personally called JC from pharmacy to confirm the consultation and decision made. Pt agreed to proceeding with Vancomycin.

## 2018-05-19 NOTE — Plan of Care (Signed)
Plan of care reviewed.  Pt with current extensive pressure ulcers and has had for some time.  Low air-loss bed obtained, WOC consult requested.

## 2018-05-19 NOTE — Progress Notes (Signed)
Pharmacy Antibiotic Note  [name] [name] is a 53 y.o. male admitted on 05/18/2018 with Wound infection.  Pharmacy has been consulted for Vancomycin, cefepime dosing.  Due to patient's quadriplegia, for CrCl calculation, adjusted SCr empirically to 1.2 and CrCl would therefore be about 38ml/min  Plan: Vancomycin held/DC by Dr. Myna Hidalgo, due to allergy/ARF and will reevaluate with team in AM.  Cefepime 2gm iv q12hr     Temp (24hrs), Avg:99.1 F (37.3 C), Min:99 F (37.2 C), Max:99.1 F (37.3 C)  Recent Labs  Lab 05/18/18 2113 05/18/18 2227 05/19/18 0116  WBC 12.6*  --   --   CREATININE 0.39*  --   --   LATICACIDVEN  --  0.75 1.32    Estimated Creatinine Clearance: 119.9 mL/min (A) (by C-G formula based on SCr of 0.39 mg/dL (L)).    Allergies  Allergen Reactions  . Feraheme [Ferumoxytol] Other (See Comments)    SYNCOPE  . Ditropan [Oxybutynin] Other (See Comments)    Hallucinations   . Vancomycin Other (See Comments)    ARF 05-2016 -- affects kidneys     Antimicrobials this admission: Vancomycin 05/19/2018 x1 Zosyn 05/19/2018 x1 Flagyl 05/19/2018 >> Cefepime 05/19/2018 >>  Dose adjustments this admission: -  Microbiology results: -  Thank you for allowing pharmacy to be a part of this patient's care.  Nani Skillern Crowford 05/19/2018 4:03 AM

## 2018-05-19 NOTE — Progress Notes (Signed)
Cpap brought to patients room  But patient does not wish to start tonight.  Will start CPAP in am.  Machine in room and ready to go if patient changes his mind.

## 2018-05-19 NOTE — Progress Notes (Signed)
Lab tried several times to obtain blood culture and was not successful.  Per RN and lab, primary MD said to obtain blood culture from port. Specimen obtained and sent to lab.

## 2018-05-19 NOTE — Progress Notes (Signed)
Sent text to Dr Rodena Piety asking if blood cultures can be drawn from [facility] after iv team is able to obtain access. She said that would be fine. [name] [name], [employer]
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
## 2018-05-20 ENCOUNTER — Other Ambulatory Visit: Payer: Self-pay | Admitting: Nurse Practitioner

## 2018-05-20 DIAGNOSIS — L8990 Pressure ulcer of unspecified site, unspecified stage: Secondary | ICD-10-CM | POA: Diagnosis not present

## 2018-05-20 DIAGNOSIS — L8995 Pressure ulcer of unspecified site, unstageable: Secondary | ICD-10-CM

## 2018-05-20 DIAGNOSIS — D638 Anemia in other chronic diseases classified elsewhere: Secondary | ICD-10-CM

## 2018-05-20 DIAGNOSIS — E876 Hypokalemia: Secondary | ICD-10-CM

## 2018-05-20 DIAGNOSIS — G4733 Obstructive sleep apnea (adult) (pediatric): Secondary | ICD-10-CM

## 2018-05-20 DIAGNOSIS — L089 Local infection of the skin and subcutaneous tissue, unspecified: Secondary | ICD-10-CM

## 2018-05-20 DIAGNOSIS — Z9989 Dependence on other enabling machines and devices: Secondary | ICD-10-CM

## 2018-05-20 NOTE — Plan of Care (Signed)
  Problem: Education: Goal: Knowledge of General Education information will improve Description: Including pain rating scale, medication(s)/side effects and non-pharmacologic comfort measures Outcome: Progressing   Problem: Health Behavior/Discharge Planning: Goal: Ability to manage health-related needs will improve Outcome: Progressing   Problem: Clinical Measurements: Goal: Ability to maintain clinical measurements within normal limits will improve Outcome: Progressing Goal: Will remain free from infection Outcome: Progressing Goal: Diagnostic test results will improve Outcome: Progressing Goal: Respiratory complications will improve Outcome: Progressing Goal: Cardiovascular complication will be avoided Outcome: Progressing   Problem: Nutrition: Goal: Adequate nutrition will be maintained Outcome: Progressing   Problem: Coping: Goal: Level of anxiety will decrease Outcome: Progressing   Problem: Pain Managment: Goal: General experience of comfort will improve Outcome: Progressing   Problem: Safety: Goal: Ability to remain free from injury will improve Outcome: Progressing   Problem: Skin Integrity: Goal: Risk for impaired skin integrity will decrease Outcome: Progressing   
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
## 2018-05-21 ENCOUNTER — Encounter (HOSPITAL_BASED_OUTPATIENT_CLINIC_OR_DEPARTMENT_OTHER): Payer: Medicare Other | Attending: Internal Medicine

## 2018-05-21 DIAGNOSIS — G4733 Obstructive sleep apnea (adult) (pediatric): Secondary | ICD-10-CM | POA: Diagnosis not present

## 2018-05-21 DIAGNOSIS — L89220 Pressure ulcer of left hip, unstageable: Secondary | ICD-10-CM | POA: Insufficient documentation

## 2018-05-21 DIAGNOSIS — G4730 Sleep apnea, unspecified: Secondary | ICD-10-CM | POA: Insufficient documentation

## 2018-05-21 DIAGNOSIS — L8995 Pressure ulcer of unspecified site, unstageable: Secondary | ICD-10-CM | POA: Diagnosis not present

## 2018-05-21 DIAGNOSIS — L89619 Pressure ulcer of right heel, unspecified stage: Secondary | ICD-10-CM | POA: Insufficient documentation

## 2018-05-21 DIAGNOSIS — G8250 Quadriplegia, unspecified: Secondary | ICD-10-CM | POA: Insufficient documentation

## 2018-05-21 DIAGNOSIS — L89154 Pressure ulcer of sacral region, stage 4: Secondary | ICD-10-CM | POA: Insufficient documentation

## 2018-05-21 DIAGNOSIS — L89153 Pressure ulcer of sacral region, stage 3: Secondary | ICD-10-CM | POA: Insufficient documentation

## 2018-05-21 DIAGNOSIS — M8668 Other chronic osteomyelitis, other site: Secondary | ICD-10-CM | POA: Insufficient documentation

## 2018-05-21 DIAGNOSIS — I10 Essential (primary) hypertension: Secondary | ICD-10-CM | POA: Insufficient documentation

## 2018-05-21 DIAGNOSIS — L8990 Pressure ulcer of unspecified site, unspecified stage: Secondary | ICD-10-CM | POA: Diagnosis not present

## 2018-05-21 DIAGNOSIS — M86352 Chronic multifocal osteomyelitis, left femur: Secondary | ICD-10-CM | POA: Insufficient documentation

## 2018-05-21 DIAGNOSIS — L89104 Pressure ulcer of unspecified part of back, stage 4: Secondary | ICD-10-CM | POA: Insufficient documentation

## 2018-05-21 DIAGNOSIS — E876 Hypokalemia: Secondary | ICD-10-CM | POA: Diagnosis not present

## 2018-05-21 DIAGNOSIS — D638 Anemia in other chronic diseases classified elsewhere: Secondary | ICD-10-CM | POA: Diagnosis not present

## 2018-05-21 LAB — CBC
HEMATOCRIT: 24.9 % — AB (ref 39.0–52.0)
Hemoglobin: 7.3 g/dL — ABNORMAL LOW (ref 13.0–17.0)
MCH: 25.5 pg — ABNORMAL LOW (ref 26.0–34.0)
MCHC: 29.3 g/dL — ABNORMAL LOW (ref 30.0–36.0)
MCV: 87.1 fL (ref 80.0–100.0)
PLATELETS: 368 10*3/uL (ref 150–400)
RBC: 2.86 MIL/uL — ABNORMAL LOW (ref 4.22–5.81)
RDW: 15 % (ref 11.5–15.5)
WBC: 8 10*3/uL (ref 4.0–10.5)
nRBC: 0 % (ref 0.0–0.2)

## 2018-05-21 LAB — BASIC METABOLIC PANEL
Anion gap: 7 (ref 5–15)
BUN: 16 mg/dL (ref 6–20)
CO2: 25 mmol/L (ref 22–32)
Calcium: 8.1 mg/dL — ABNORMAL LOW (ref 8.9–10.3)
Chloride: 108 mmol/L (ref 98–111)
Creatinine, Ser: 0.39 mg/dL — ABNORMAL LOW (ref 0.61–1.24)
GFR calc Af Amer: >60 mL/min (ref >60)
GFR calc non Af Amer: >60 mL/min (ref >60)
GLUCOSE: 109 mg/dL — AB (ref 70–99)
Potassium: 3.9 mmol/L (ref 3.5–5.1)
Sodium: 140 mmol/L (ref 135–145)

## 2018-05-21 MED ORDER — AMOXICILLIN-POT CLAVULANATE 875-125 MG PO TABS: 1.0000 | ORAL_TABLET | Freq: Two times a day (BID) | ORAL | 0 refills | Status: AC

## 2018-05-21 NOTE — Progress Notes (Signed)
CSW received a call from pt's CN stating pt was transferred from another floor and then not soon after was deemed ready for D/C.  CSW will bring med necessity transport form to pt's RN/CN and CN stated PTAR be called by nursing when D/C process is complete.  CSW will continue to follow for D/C needs.  Alphonse Guild. Karisha Marlin, LCSW, LCAS, CSI Clinical Social Worker Ph: [phone]

## 2018-05-21 NOTE — Consult Note (Addendum)
[place] Nurse wound consult note Patient receiving care in [place].  No family present, however, patient called his sister with whom he lives, and she joined [place] by the patient's cell phone in order to have her questions answered. Reason for Consult: Wound type: Pressure Injury POA: Yes/No/NA Measurements: Right/Left ischial ulcers are stage 4 with visible exposed bone bilaterally.  Right Ischium measures 5.5 cm x 7.2 cm.  Wound bed is slick pink with exposed bone. Very little drainage, no odor.  Left ischium entire wound area measures 8.3 cm x 15 cm and has a narrow tissue island between the larger and smaller wound areas.  The entire wound bed is slick pink with exposed bone and very little drainage, no odor.  The plan of care for these wounds is: Apply Xeroform gauze to wounds on sacrum and ischium Kellie Simmering [phone]). Cover with ABD pads. Tape in place. Change daily. The left hip has a stage 4 wound that measureds 6.5 cm x 5.3 cm x 1.8 cm with 3.3 cm tunneling at the 12 o'clock position.  This wound has a small amount of drainage on the existing dressing, and no foul odor that I can detect.  The plan of care for this area is Silver hydrofiber (Aquacel Ag, Kellie Simmering # [phone]).  This is the wound that the patient and his sister are most concerned about.  They asked me if Infectious diseases and surgical services should be involved in the care of this wound, and certainly these specialists may provide important insights into the care of the wound, therefore, I do recommend pursuing these consults. The right upper back has a stage 4 PI that measures 13 cm x 6 cm, has heavy yellow thick drainage in the wound bed; no odor.  For this wound the use of Aquacel Ag+ daily will help combat microbial load.   The right medial heel has stable, dry black eschar that measures 1 cm x 1 cm. There is no drainage, no odor. For this area apply betadine, allow to air dry. Cover with dry gauze. Change twice daily. The patient is on a low  air loss mattress. The patient and his sister expressed concerns that the last time he was hospitalized, he was discharge too soon.  They have requested that someone in Administration speak with them about their concerns.  I have relayed this to the Surveyor, quantity of the Unit.  Also, they expressed concerns about how the [facility] and the home health agency have been performing.  I will enter an order for Case Management to facilitate conversation about home equipment and future treatment needs.  Additionally, the patient uses 2 3/4 inch, flat, two piece ostomy supplies for a LLQ colostomy.  Thank you for the consult.  Discussed plan of care with the patient and bedside nurse.  [place] nurse will not follow at this time.  Please re-consult the [place] team if needed.  Val Riles, RN, MSN, CWOCN, CNS-BC, pager [phone]

## 2018-05-21 NOTE — Progress Notes (Signed)
Discussed patient with Dr. Rodena Piety.  Patient is not septic.  Photos of all of his wounds have been reviewed.  Most of these wounds are quite clean and chronic in nature.  No surgical intervention is warranted for these.  This was relayed to the primary service.  Despite his inability to likely ever heal these wounds secondary to his paraplegic state, etc, a plastic surgery consult could be obtained to determine if they feel anything further can be done for these.  No surgical plans.  Henreitta Cea 12:31 PM 05/21/2018

## 2018-05-21 NOTE — Progress Notes (Addendum)
CSW received a call from pt's sister who stated in a very loud voice she was very irate that a Engineer, structural was sent to her home and continued on in an angry and loud voice in an uninterrupted and long monologue that pt's sister worked in a group home and was not given sufficient notice that the pt was being D/C'd, and that she had to work and asked what the hospital expected her to do and continued on in a very loud voice yelling at the [place] for about 2-3 minutes.  After a period of time the CSW explained that the pt was D/C'd, that the pt is disabled and that as such, the CSW was asked to inform her that pt is D/C'd and that notice is not always able to be given. CSW apologized profusely that the pt's sister felt that there was not adequate notice given of D/C if this was the case, but that the CSW was informing her that the pt is ready for D/C and that once deemed ready for D/C then the pt could not remain in the hospital and asked if the pt's sister could assist with getting the pt home.  Pt's sister stated she could not and began to yell at the [place] again.  Per the CN, the provider was speaking to the pt's sister earlier in the day about D/C and the pt's sister had "hung up" on the provider and that after this the pt's sister refused to answer her phone anymore.  CSW explained to the pt's sister that a welfare check to her home by [employer] was necessary to insure the pt's sister was aware that pt was ready for D/C and that the CSW had no other way to verify that the pt's sister was aware.  Pt's sister continued to scream and yell at the social worker that, "Arnetha Gula done lost your minds at [employer] and continued on in a loud voice about how the pt's sister has a job and that the pt's sister can't wait 3-4 hours for the pt to arrive, etc".  CSW explained that he understood that this was great inconvenience, especially when work is affected, but that family is expected to assist when family is  D/C'ing and asked if an arrangement can be worked out in order to transport the pt home between pt's sister and pt's sister employer since this is the pt's sister's family.  CSW again stated that the pt's RN would be glad to call and inform the pt's sister when the pt is en route and that the pt's sister would not have to wait at the home and that the RN could call when the pt leaves and the pt's sister became extremely angry AEB the pt's sister shouting and yelling and stated, "Aren't ya'll hearing me?".  CSW stated that the CSW was asked to call and make sure the pt's sister is informed, that the pt cannot remain at the hospital and that since this is the case, if the pt's sister will not assist in getting the pt home, the CSW will be obligated to call DSS and report abandonment of the pt at the hospital due to pt being disabled and asked if the pt's sister could make arrangements to be at the home when the pt arrives.  Pt's sister stated she could not and began to yell at the [place] again and then hung up.  Pt's sister then called back and began to yell at the [place] again  and the CSW stated that the CSW was asked to make sure the pt's sister was aware and pt's sister began to yell again and the CSW repeated he wanted to make sure pt's sister was aware and could the pt's sister please be at the home to receive the pt, the pt's sister asked, "What do ya'll want me to do, just leave him there while I go to work, won't that be abandonment?  CSW asked if the pt's sister leaves the house on other occasions and pt's sister began to yell and CSW stated he understood pt's sister was aware and asked if there was anything he could do to help, and pt's sister didn't say anything and hung up.  Pt's CN updated.  CN stated it she was in agreement with the CSW it would be unsafe to D/C the pt home without the pt's sister there to receive him.  2nd shift ED CSW will leave handoff for 1st shift ED CSW.  CSW will continue  to follow for D/C needs.  Alphonse Guild. Eulalah [name], LCSW, LCAS, CSI Clinical Social Worker Ph: [phone]

## 2018-05-21 NOTE — Progress Notes (Signed)
At 1225, report was provided to [facility], the receiving RN. After pt has finished lunch, the pt will be transferred to room 1620.

## 2018-05-21 NOTE — Care Management Note (Signed)
Case Management Note  Patient Details  Name: [name] [name] MRN: [id number] Date of Birth: November 14, 1965  Subjective/Objective:    Spoke with patient at bedside. States he lives at home with his sister who assists with his care. He has a w/c that he uses to get around. He has all needed DME but is having difficulty with an air mattress. He says he has spoken with them several times but has not had a resolution. He is active with [facility] and wishes to continue with them.                 Action/Plan: Contacted [facility] regarding air mattress issues. Contacted [facility] for follow up after d/c.   Expected Discharge Date:                  Expected Discharge Plan:  [name]  In-House Referral:  NA  Discharge planning Services  CM Consult  Post Acute Care Choice:  Home Health Choice offered to:  Patient  DME Arranged:  N/A DME Agency:  NA  HH Arranged:  Disease Management, RN [place] Agency:  [facility]  Status of Service:  Completed, signed off  If discussed at [place] of Stay Meetings, dates discussed:    Additional Comments:  Guadalupe Maple, RN 05/21/2018, 12:39 PM
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
## 2018-05-21 NOTE — Discharge Instructions (Signed)
Apply Xeroform gauze to right and left ischium covered with ABD pads and change daily  Left hip covered with Aquacel Ag daily  Right upper back covered with Aquacel Ag daily  Right medial heel covered with dry gauze and changed twice a day.  Continue to use low air loss mattress  Follow-up with wound clinic regularly
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
## 2018-05-21 NOTE — Progress Notes (Signed)
At [facility], Landis Gandy, MD was paged regarding a wound consult for the pt's mult. Wounds.

## 2018-05-21 NOTE — Progress Notes (Addendum)
CSW spoke to pt's RN who stated can not reach his sister as the D/C was unexpected, per the pt.  Pt's RN states the provider states pt was aware of D/C.  Pt's RN also stated pt states he doesn't think he can D/C as his bed at home is inadequate.  CSW spoke to pt via RN's phone and pt stated he could not reach his sister and that D/C was unexpected. Pt stated he had told his "D/C planner" (pt stated this person was wearing a white coat and could be a RN CM possibly) that the pt's bed was inadequate and that the D/C planner had brought [facility] by and that [facility] had come by and spoke to the pt.  CSW asked for verbal permission to call pt's sister and pt provided verbal permission.  CSW counseled pt in the presence of the pt's RN that unless a new bed was needed prior to D/C, per provider's orders then the lack of what the pt termed a more adequate bed was not able to keep the pt from being D/C'd.  Pt voiced understanding.  CSW spoke to the RN CM who reviewed pt's chart and noted that there were no more DME needs, per pt's RN CM's notes that if the pt need another air mattress then the pt would have to provide that or have another medical supply agency provide that post-D/C as per notes, there were no more DME needs noted. RN CM stated that per notes there was nothing the pt had to remain in the hospital for prior to D/C.  CSW called pt's sister [name] at ph: [phone] and left a HIPPA-compliant VM requesting a call back.  CSW will continue to follow for D/C needs.  Alphonse Guild. Zinnia Tindall, LCSW, LCAS, CSI Clinical Social Worker Ph: [phone]

## 2018-05-21 NOTE — Progress Notes (Signed)
Received report from [place] at 1325. PT will be transferred to 1620 after he is finished eating lunch, per RN.

## 2018-05-21 NOTE — Progress Notes (Signed)
    [facility] for Infectious Disease    Date of Admission:  05/18/2018   Total days of antibiotics 4           ID: [name] [name] is a 53 y.o. male with  Recurrent sacral ischial wounds/chronic osteomyelitis Principal Problem:   Infected pressure ulcer Active Problems:   Sacral decubitus ulcer, stage IV ([name]   OSA on CPAP   Anemia of chronic disease   Hypokalemia   Quadriplegia, C5-C7, complete ([name]    Subjective: Afebrile  I have reviewed his photos  Medications:  . baclofen  20 mg Oral QID  . fesoterodine  8 mg Oral Daily  . multivitamin with minerals  1 tablet Oral q morning - 10a  . nutrition supplement (JUVEN)  1 packet Oral BID BM  . pantoprazole  40 mg Oral BID  . potassium chloride SA  20 mEq Oral BID  . sodium chloride flush  3 mL Intravenous Q12H  . sucralfate  1 g Oral QID  . vitamin C  500 mg Oral Daily  . zinc sulfate  220 mg Oral BID    Objective: Vital signs in last 24 hours: Temp:  [98.2 F (36.8 C)-99.6 F (37.6 C)] 98.2 F (36.8 C) (01/06 1310) Pulse Rate:  [85-103] 90 (01/06 1310) Resp:  [16-18] 16 (01/06 1310) BP: (118-151)/(75-86) 118/86 (01/06 1310) SpO2:  [99 %-100 %] 99 % (01/06 1310)    Lab Results Recent Labs    05/19/18 1256 05/21/18 0400  WBC 11.1* 8.0  HGB 7.9* 7.3*  HCT 26.6* 24.9*  NA 139 140  K 3.1* 3.9  CL 105 108  CO2 26 25  BUN 11 16  CREATININE <0.30* 0.39*   Liver Panel Recent Labs    05/18/18 2228  PROT 7.3  ALBUMIN 2.6*  AST 15  ALT 12  ALKPHOS 61  BILITOT 0.5  BILIDIR <0.1  IBILI NOT CALCULATED    Microbiology: reveiwed Studies/Results: No results found.   Assessment/Plan: - would finish out an additional 7 day of amox/clav for SSTI/cellulitis.  - the mainstay of his treatment is wound care and plastic surgery evaluation   [facility] Cell: [phone] Pager: [phone]  05/21/2018, 3:47 PM

## 2018-05-21 NOTE — Progress Notes (Addendum)
CSW was unable to reach pt's sister [name] at ph: [phone] and was unable to reach her.  CSW called non-emergency dispatch and requested a welfare check to the pt/pt's sister's home to ensure pt's sister was aware pt was ready for D/C so pt's sister could be present when pt arrived via [place].  CSW updated pt's RN/CN.  Per the pt's CN pt's sister was discussing D/C with the provider and the pt's sister then hung up after pt's sister was told of the pt's imminent D/C and that the pt's sister had insisted "she was going out for dinner" with someone on 05/21/17.  7:07 PM CSW will continue to follow for D/C needs.  Alphonse Guild. Tayloranne Lekas, LCSW, LCAS, CSI Clinical Social Worker Ph: [phone]

## 2018-05-22 DIAGNOSIS — M2550 Pain in unspecified joint: Secondary | ICD-10-CM | POA: Diagnosis not present

## 2018-05-22 DIAGNOSIS — Z7401 Bed confinement status: Secondary | ICD-10-CM | POA: Diagnosis not present

## 2018-05-22 IMAGING — CT CT ABD-PELV W/ CM
2 of 5 series · 16 of 46 positions shown, 18 images · IV contrast (APPLIED)
Comparison: 08/17/2015

CLINICAL DATA: Epigastric pain and vomiting.  Quadriplegic.

EXAM:
CT ABDOMEN AND PELVIS WITH CONTRAST
TECHNIQUE: Multidetector CT imaging of the abdomen and pelvis was performed
using the standard protocol following bolus administration of
intravenous contrast.
CONTRAST:  100ml [id number] IOPAMIDOL ([id number]) INJECTION 61%

[Series 2: abd/ pelvis 5.0 i30f 1 · axial · 0.97mm/px · z∈[+727,+1172]mm · 13 of 99 slices shown, 15 images]
[im 5/99  soft-tissue]
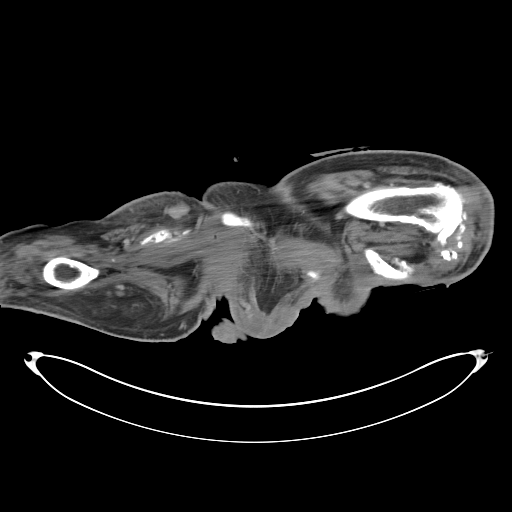
[im 5/99  bone]
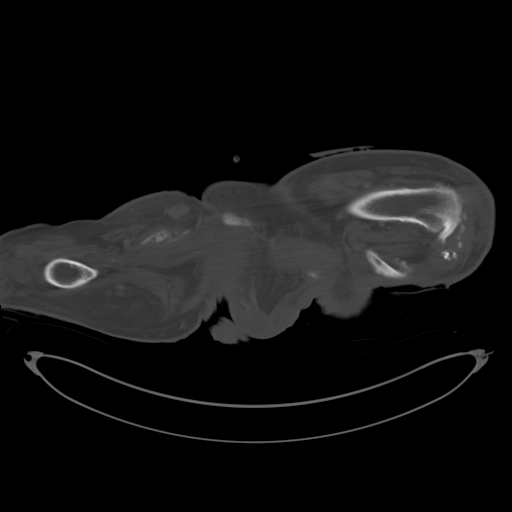
[im 15/99  soft-tissue]
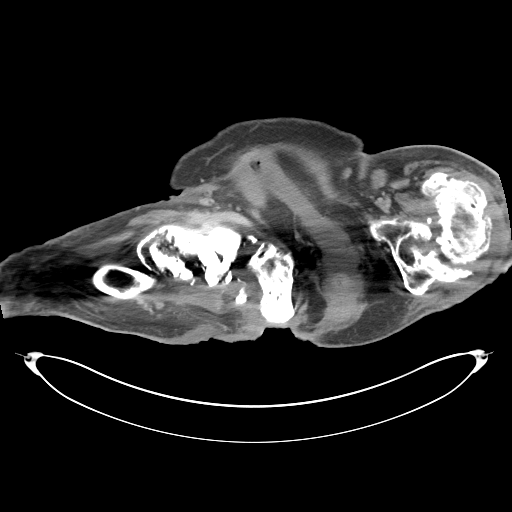
[im 20/99  soft-tissue]
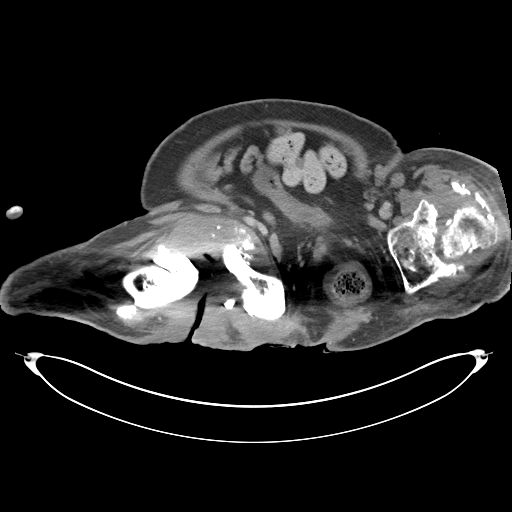
[im 30/99  soft-tissue]
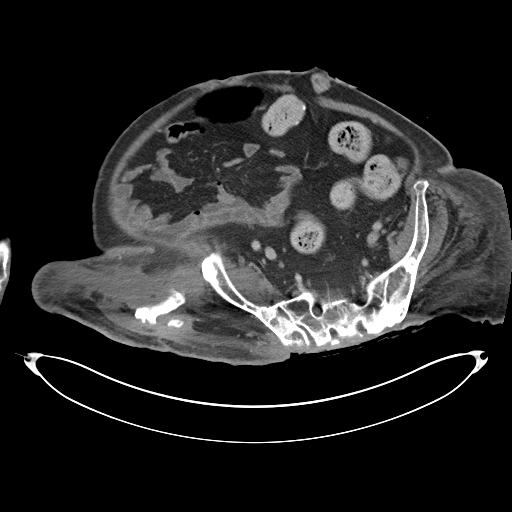
[im 35/99  soft-tissue]
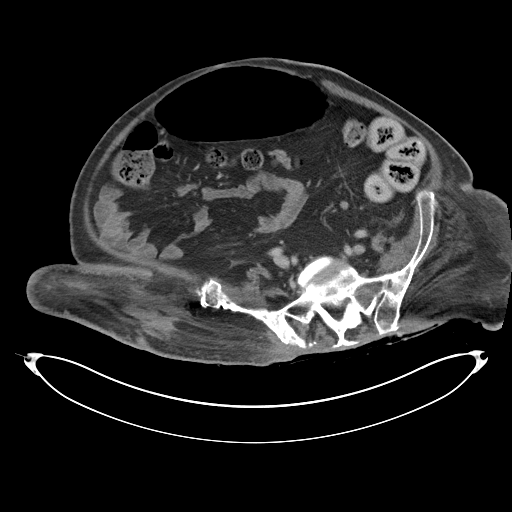
[im 45/99  soft-tissue]
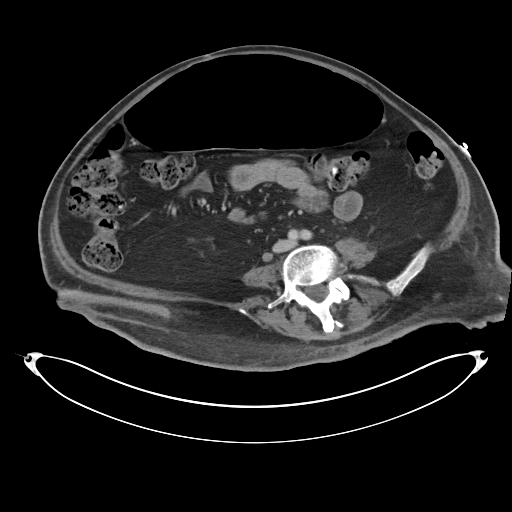
[im 50/99  soft-tissue]
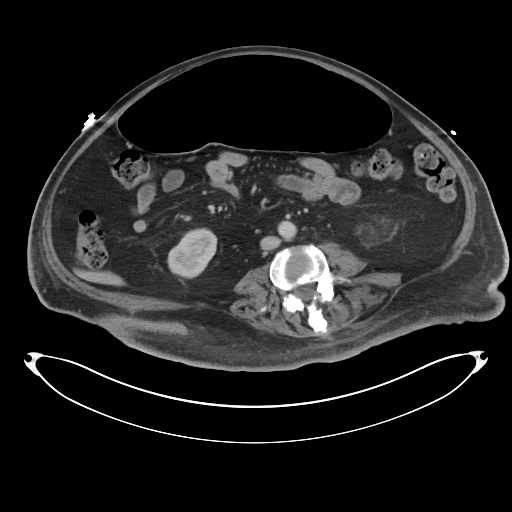
[im 54/99  soft-tissue]
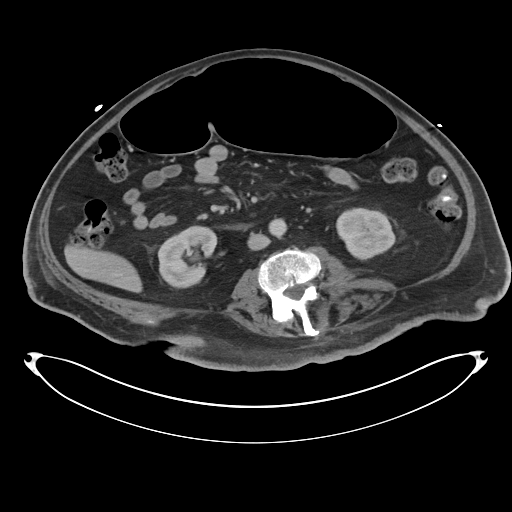
[im 64/99  soft-tissue]
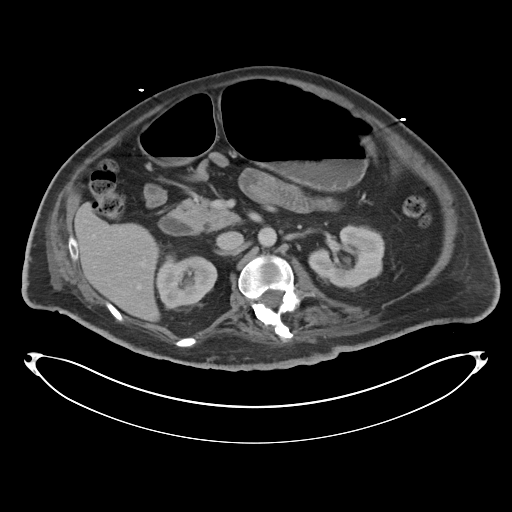
[im 64/99  bone]
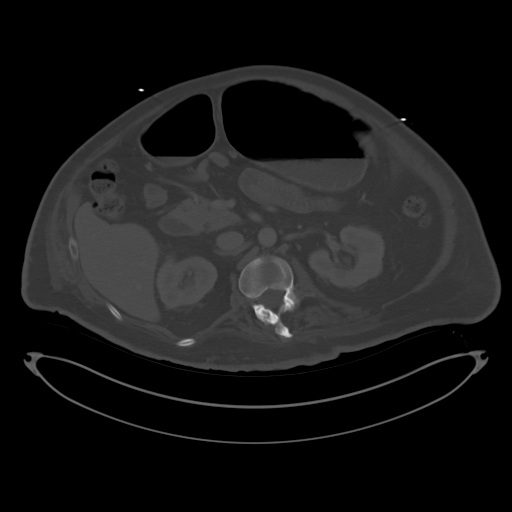
[im 69/99  soft-tissue]
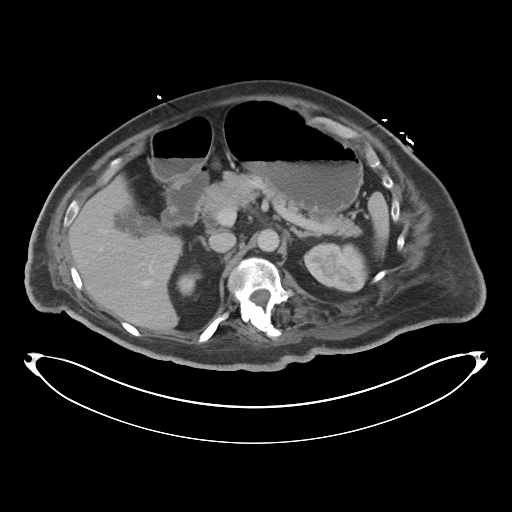
[im 79/99  soft-tissue]
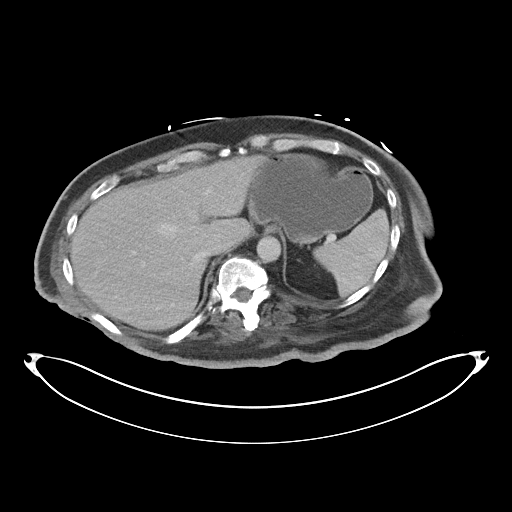
[im 84/99  soft-tissue]
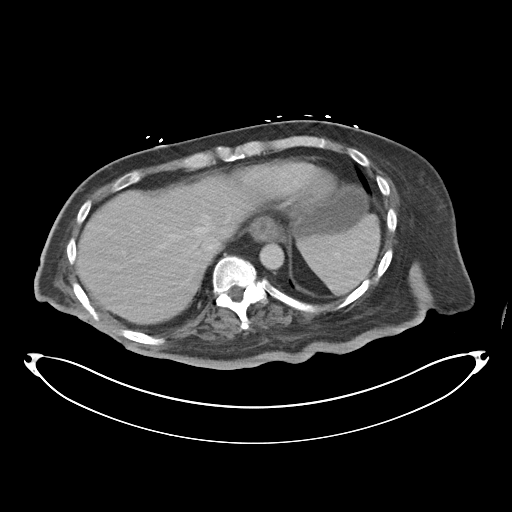
[im 94/99  soft-tissue]
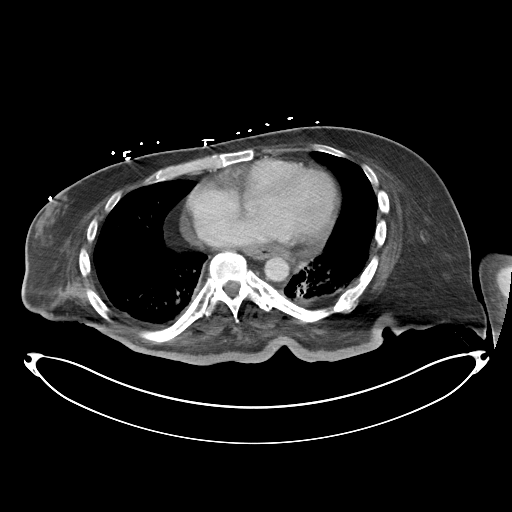

[Series 5: coronal soft tissue · coronal · 0.98mm/px · 3 of 101 slices shown]
[im 34/101  soft-tissue]
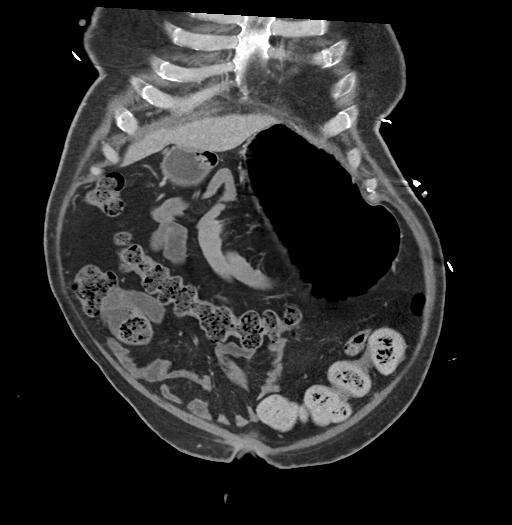
[im 45/101  soft-tissue]
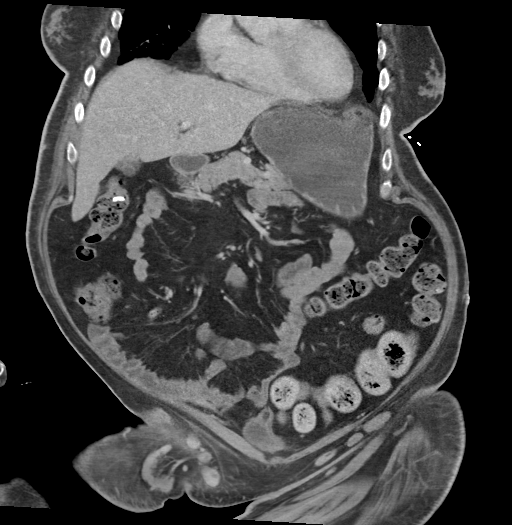
[im 56/101  soft-tissue]
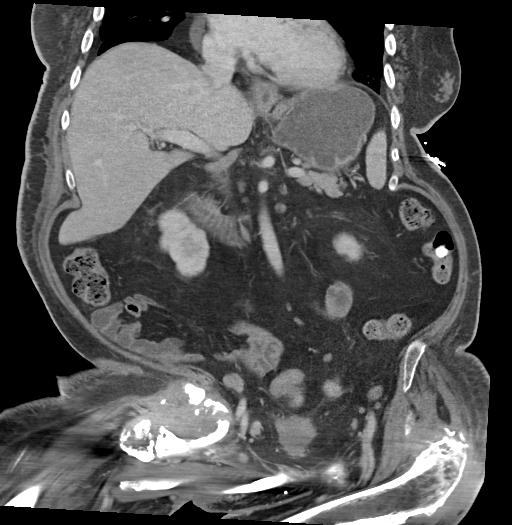

[16 of 46 positions shown; findings below may reference images not displayed]

FINDINGS: Heart bases demonstrate resolution of the previously seen bilateral
pleural effusions are. There is minimal left basilar opacification
likely mild residual atelectasis. Mild cardiomegaly. Minimal wall
thickening of the distal esophagus likely related to reflux
esophagitis.

Again noted is patient's decubitus ulcer over the right posterior
lateral lower thorax with possible involvement of the underlying
rib. Also suggestion of nondisplaced fracture of this ninth rib.

Abdominal images demonstrate the liver, spleen, pancreas,
gallbladder and adrenal glands to be within normal.

Kidneys are normal in size without hydronephrosis or
nephrolithiasis. 1 cm hypodensity over the mid pole left kidney too
small to characterize but likely a cyst and unchanged. Ureters
within normal per

Vascular structures within normal. Retro aortic left renal vein is
present.

There is moderate gastric distention slightly worse. Small bowel is
within normal. Appendix is normal. There is been a partial colectomy
with left lower quadrant colostomy site which is unchanged. Air and
stool present within the distal colonic segment with mild stable
rectal wall thickening. Air and stool present throughout the colon
proximal to the colostomy site. No significant free peritoneal fluid
or focal inflammatory change. No free peritoneal air.

Pelvic images demonstrate a suprapubic Foley catheter within the
bladder.

No change extensive decubitus ulcers over the dependent pelvis. The
severe bony deformity of the hips with bone destruction and mixed
lytic and sclerotic process likely due to chronic osteomyelitis. The
heterotopic bone over the anterior right pelvis. The findings are
unchanged. Remainder of the exam is unchanged.
IMPRESSION: No acute findings in the abdomen/ pelvis.

Gastric distension worse compared to the priors exam. No definite
cause for gastric outlet obstruction. Findings may be due to
gastroparesis.

Multiple stable chronic postsurgical changes as described.

Stable cough soft tissue ulceration over the posterior lateral
aspect of the lower right thorax unchanged. Likely infectious
involvement the adjacent underlying ninth ribs as well as subtle
nondisplaced fracture of the right ninth rib.

Extensive chronic stable changes of the pelvic bones and hips
suggesting chronic osteomyelitis with stable extensive decubitus
ulceration over the mid to right posterior pelvis.

## 2018-05-22 MED ORDER — HEPARIN SOD (PORK) LOCK FLUSH 100 UNIT/ML IV SOLN
500.0000 [IU] | Freq: Once | INTRAVENOUS | Status: DC
  Filled 2018-05-22: qty 5

## 2018-05-22 NOTE — Progress Notes (Signed)
CSW spoke with patients sister, [name], via phone call. Ms. [name] informed CSW she thought patient would be returning to her home yesterday and was surprised he did not arrive. Ms. [name] requested someone contact her once patient is picked up by [facility] so she can meet him at her home.   Ms. [name] available via phone and awaiting patients arrival.   Kingsley Spittle, [place]  [phone]

## 2018-05-24 LAB — CULTURE, BLOOD (ROUTINE X 2)
Culture: NO GROWTH
Culture: NO GROWTH
Special Requests: ADEQUATE
Special Requests: ADEQUATE

## 2018-05-29 DIAGNOSIS — M8668 Other chronic osteomyelitis, other site: Secondary | ICD-10-CM | POA: Diagnosis not present

## 2018-05-29 DIAGNOSIS — L89154 Pressure ulcer of sacral region, stage 4: Secondary | ICD-10-CM | POA: Diagnosis not present

## 2018-05-29 DIAGNOSIS — G4730 Sleep apnea, unspecified: Secondary | ICD-10-CM | POA: Diagnosis not present

## 2018-05-29 DIAGNOSIS — L89220 Pressure ulcer of left hip, unstageable: Secondary | ICD-10-CM | POA: Diagnosis not present

## 2018-05-29 DIAGNOSIS — G8250 Quadriplegia, unspecified: Secondary | ICD-10-CM | POA: Diagnosis not present

## 2018-05-29 DIAGNOSIS — L89153 Pressure ulcer of sacral region, stage 3: Secondary | ICD-10-CM | POA: Diagnosis not present

## 2018-05-29 DIAGNOSIS — L89104 Pressure ulcer of unspecified part of back, stage 4: Secondary | ICD-10-CM | POA: Diagnosis not present

## 2018-05-29 DIAGNOSIS — I10 Essential (primary) hypertension: Secondary | ICD-10-CM | POA: Diagnosis not present

## 2018-05-29 DIAGNOSIS — M86352 Chronic multifocal osteomyelitis, left femur: Secondary | ICD-10-CM | POA: Diagnosis not present

## 2018-05-29 DIAGNOSIS — L89610 Pressure ulcer of right heel, unstageable: Secondary | ICD-10-CM | POA: Diagnosis not present

## 2018-05-29 DIAGNOSIS — L89619 Pressure ulcer of right heel, unspecified stage: Secondary | ICD-10-CM | POA: Diagnosis not present

## 2018-05-29 DIAGNOSIS — L89224 Pressure ulcer of left hip, stage 4: Secondary | ICD-10-CM | POA: Diagnosis not present

## 2018-05-30 DIAGNOSIS — L89314 Pressure ulcer of right buttock, stage 4: Secondary | ICD-10-CM | POA: Diagnosis not present

## 2018-05-30 DIAGNOSIS — I10 Essential (primary) hypertension: Secondary | ICD-10-CM | POA: Diagnosis not present

## 2018-05-30 DIAGNOSIS — L89324 Pressure ulcer of left buttock, stage 4: Secondary | ICD-10-CM | POA: Diagnosis not present

## 2018-05-30 DIAGNOSIS — L89114 Pressure ulcer of right upper back, stage 4: Secondary | ICD-10-CM | POA: Diagnosis not present

## 2018-05-30 DIAGNOSIS — L89214 Pressure ulcer of right hip, stage 4: Secondary | ICD-10-CM | POA: Diagnosis not present

## 2018-05-30 DIAGNOSIS — G8254 Quadriplegia, C5-C7 incomplete: Secondary | ICD-10-CM | POA: Diagnosis not present

## 2018-05-30 DIAGNOSIS — G8250 Quadriplegia, unspecified: Secondary | ICD-10-CM | POA: Diagnosis not present

## 2018-05-30 DIAGNOSIS — Z466 Encounter for fitting and adjustment of urinary device: Secondary | ICD-10-CM | POA: Diagnosis not present

## 2018-05-30 DIAGNOSIS — D509 Iron deficiency anemia, unspecified: Secondary | ICD-10-CM | POA: Diagnosis not present

## 2018-05-30 DIAGNOSIS — Z933 Colostomy status: Secondary | ICD-10-CM | POA: Diagnosis not present

## 2018-05-30 DIAGNOSIS — G4733 Obstructive sleep apnea (adult) (pediatric): Secondary | ICD-10-CM | POA: Diagnosis not present

## 2018-05-30 DIAGNOSIS — L8945 Pressure ulcer of contiguous site of back, buttock and hip, unstageable: Secondary | ICD-10-CM | POA: Diagnosis not present

## 2018-05-30 DIAGNOSIS — E669 Obesity, unspecified: Secondary | ICD-10-CM | POA: Diagnosis not present

## 2018-05-30 DIAGNOSIS — K210 Gastro-esophageal reflux disease with esophagitis: Secondary | ICD-10-CM | POA: Diagnosis not present

## 2018-05-30 DIAGNOSIS — L89154 Pressure ulcer of sacral region, stage 4: Secondary | ICD-10-CM | POA: Diagnosis not present

## 2018-05-31 DIAGNOSIS — E162 Hypoglycemia, unspecified: Secondary | ICD-10-CM | POA: Diagnosis not present

## 2018-05-31 DIAGNOSIS — S3991XA Unspecified injury of abdomen, initial encounter: Secondary | ICD-10-CM | POA: Diagnosis not present

## 2018-05-31 DIAGNOSIS — E46 Unspecified protein-calorie malnutrition: Secondary | ICD-10-CM | POA: Diagnosis not present

## 2018-05-31 DIAGNOSIS — M4628 Osteomyelitis of vertebra, sacral and sacrococcygeal region: Secondary | ICD-10-CM | POA: Diagnosis not present

## 2018-05-31 DIAGNOSIS — Z79899 Other long term (current) drug therapy: Secondary | ICD-10-CM | POA: Diagnosis not present

## 2018-05-31 DIAGNOSIS — E161 Other hypoglycemia: Secondary | ICD-10-CM | POA: Diagnosis not present

## 2018-05-31 DIAGNOSIS — J9811 Atelectasis: Secondary | ICD-10-CM | POA: Diagnosis not present

## 2018-05-31 DIAGNOSIS — L89103 Pressure ulcer of unspecified part of back, stage 3: Secondary | ICD-10-CM | POA: Diagnosis not present

## 2018-05-31 DIAGNOSIS — L89214 Pressure ulcer of right hip, stage 4: Secondary | ICD-10-CM | POA: Diagnosis not present

## 2018-05-31 DIAGNOSIS — L89154 Pressure ulcer of sacral region, stage 4: Secondary | ICD-10-CM | POA: Diagnosis not present

## 2018-05-31 DIAGNOSIS — J189 Pneumonia, unspecified organism: Secondary | ICD-10-CM | POA: Diagnosis not present

## 2018-05-31 DIAGNOSIS — B964 Proteus (mirabilis) (morganii) as the cause of diseases classified elsewhere: Secondary | ICD-10-CM | POA: Diagnosis not present

## 2018-05-31 DIAGNOSIS — L89134 Pressure ulcer of right lower back, stage 4: Secondary | ICD-10-CM | POA: Diagnosis not present

## 2018-05-31 DIAGNOSIS — R279 Unspecified lack of coordination: Secondary | ICD-10-CM | POA: Diagnosis not present

## 2018-05-31 DIAGNOSIS — S299XXA Unspecified injury of thorax, initial encounter: Secondary | ICD-10-CM | POA: Diagnosis not present

## 2018-05-31 DIAGNOSIS — L0889 Other specified local infections of the skin and subcutaneous tissue: Secondary | ICD-10-CM | POA: Diagnosis not present

## 2018-05-31 DIAGNOSIS — J45909 Unspecified asthma, uncomplicated: Secondary | ICD-10-CM | POA: Diagnosis not present

## 2018-05-31 DIAGNOSIS — I959 Hypotension, unspecified: Secondary | ICD-10-CM | POA: Diagnosis not present

## 2018-05-31 DIAGNOSIS — G8250 Quadriplegia, unspecified: Secondary | ICD-10-CM | POA: Diagnosis not present

## 2018-05-31 DIAGNOSIS — E876 Hypokalemia: Secondary | ICD-10-CM | POA: Diagnosis not present

## 2018-05-31 DIAGNOSIS — Z933 Colostomy status: Secondary | ICD-10-CM | POA: Diagnosis not present

## 2018-05-31 DIAGNOSIS — N390 Urinary tract infection, site not specified: Secondary | ICD-10-CM | POA: Diagnosis not present

## 2018-05-31 DIAGNOSIS — I10 Essential (primary) hypertension: Secondary | ICD-10-CM | POA: Diagnosis not present

## 2018-05-31 DIAGNOSIS — M86652 Other chronic osteomyelitis, left thigh: Secondary | ICD-10-CM | POA: Diagnosis not present

## 2018-05-31 DIAGNOSIS — K210 Gastro-esophageal reflux disease with esophagitis: Secondary | ICD-10-CM | POA: Diagnosis not present

## 2018-05-31 DIAGNOSIS — N319 Neuromuscular dysfunction of bladder, unspecified: Secondary | ICD-10-CM | POA: Diagnosis not present

## 2018-05-31 DIAGNOSIS — L89220 Pressure ulcer of left hip, unstageable: Secondary | ICD-10-CM | POA: Diagnosis not present

## 2018-05-31 DIAGNOSIS — R509 Fever, unspecified: Secondary | ICD-10-CM | POA: Diagnosis not present

## 2018-05-31 DIAGNOSIS — Z436 Encounter for attention to other artificial openings of urinary tract: Secondary | ICD-10-CM | POA: Diagnosis not present

## 2018-05-31 DIAGNOSIS — Z743 Need for continuous supervision: Secondary | ICD-10-CM | POA: Diagnosis not present

## 2018-05-31 DIAGNOSIS — T68XXXA Hypothermia, initial encounter: Secondary | ICD-10-CM | POA: Diagnosis not present

## 2018-05-31 DIAGNOSIS — Z9359 Other cystostomy status: Secondary | ICD-10-CM | POA: Diagnosis not present

## 2018-06-06 ENCOUNTER — Other Ambulatory Visit: Payer: Self-pay | Admitting: Nurse Practitioner

## 2018-06-06 DIAGNOSIS — K3184 Gastroparesis: Secondary | ICD-10-CM

## 2018-06-06 DIAGNOSIS — K219 Gastro-esophageal reflux disease without esophagitis: Secondary | ICD-10-CM

## 2018-06-13 DIAGNOSIS — I10 Essential (primary) hypertension: Secondary | ICD-10-CM | POA: Diagnosis not present

## 2018-06-13 DIAGNOSIS — G8250 Quadriplegia, unspecified: Secondary | ICD-10-CM | POA: Diagnosis not present

## 2018-06-15 DIAGNOSIS — Z466 Encounter for fitting and adjustment of urinary device: Secondary | ICD-10-CM | POA: Diagnosis not present

## 2018-06-15 DIAGNOSIS — E669 Obesity, unspecified: Secondary | ICD-10-CM | POA: Diagnosis not present

## 2018-06-15 DIAGNOSIS — L89314 Pressure ulcer of right buttock, stage 4: Secondary | ICD-10-CM | POA: Diagnosis not present

## 2018-06-15 DIAGNOSIS — D509 Iron deficiency anemia, unspecified: Secondary | ICD-10-CM | POA: Diagnosis not present

## 2018-06-15 DIAGNOSIS — L89114 Pressure ulcer of right upper back, stage 4: Secondary | ICD-10-CM | POA: Diagnosis not present

## 2018-06-15 DIAGNOSIS — L89214 Pressure ulcer of right hip, stage 4: Secondary | ICD-10-CM | POA: Diagnosis not present

## 2018-06-15 DIAGNOSIS — G8254 Quadriplegia, C5-C7 incomplete: Secondary | ICD-10-CM | POA: Diagnosis not present

## 2018-06-15 DIAGNOSIS — K210 Gastro-esophageal reflux disease with esophagitis: Secondary | ICD-10-CM | POA: Diagnosis not present

## 2018-06-15 DIAGNOSIS — Z933 Colostomy status: Secondary | ICD-10-CM | POA: Diagnosis not present

## 2018-06-15 DIAGNOSIS — I10 Essential (primary) hypertension: Secondary | ICD-10-CM | POA: Diagnosis not present

## 2018-06-15 DIAGNOSIS — L89324 Pressure ulcer of left buttock, stage 4: Secondary | ICD-10-CM | POA: Diagnosis not present

## 2018-06-15 DIAGNOSIS — L89154 Pressure ulcer of sacral region, stage 4: Secondary | ICD-10-CM | POA: Diagnosis not present

## 2018-06-15 DIAGNOSIS — G4733 Obstructive sleep apnea (adult) (pediatric): Secondary | ICD-10-CM | POA: Diagnosis not present

## 2018-06-20 DIAGNOSIS — G8254 Quadriplegia, C5-C7 incomplete: Secondary | ICD-10-CM | POA: Diagnosis not present

## 2018-06-20 DIAGNOSIS — L89314 Pressure ulcer of right buttock, stage 4: Secondary | ICD-10-CM | POA: Diagnosis not present

## 2018-06-20 DIAGNOSIS — L89114 Pressure ulcer of right upper back, stage 4: Secondary | ICD-10-CM | POA: Diagnosis not present

## 2018-06-20 DIAGNOSIS — L89214 Pressure ulcer of right hip, stage 4: Secondary | ICD-10-CM | POA: Diagnosis not present

## 2018-06-20 DIAGNOSIS — Z87440 Personal history of urinary (tract) infections: Secondary | ICD-10-CM | POA: Diagnosis not present

## 2018-06-20 DIAGNOSIS — L89154 Pressure ulcer of sacral region, stage 4: Secondary | ICD-10-CM | POA: Diagnosis not present

## 2018-06-20 DIAGNOSIS — Z933 Colostomy status: Secondary | ICD-10-CM | POA: Diagnosis not present

## 2018-06-20 DIAGNOSIS — I10 Essential (primary) hypertension: Secondary | ICD-10-CM | POA: Diagnosis not present

## 2018-06-20 DIAGNOSIS — Z466 Encounter for fitting and adjustment of urinary device: Secondary | ICD-10-CM | POA: Diagnosis not present

## 2018-06-20 DIAGNOSIS — L89324 Pressure ulcer of left buttock, stage 4: Secondary | ICD-10-CM | POA: Diagnosis not present

## 2018-06-20 DIAGNOSIS — D509 Iron deficiency anemia, unspecified: Secondary | ICD-10-CM | POA: Diagnosis not present

## 2018-06-20 DIAGNOSIS — G4733 Obstructive sleep apnea (adult) (pediatric): Secondary | ICD-10-CM | POA: Diagnosis not present

## 2018-06-20 DIAGNOSIS — Z436 Encounter for attention to other artificial openings of urinary tract: Secondary | ICD-10-CM | POA: Diagnosis not present

## 2018-06-20 DIAGNOSIS — K210 Gastro-esophageal reflux disease with esophagitis: Secondary | ICD-10-CM | POA: Diagnosis not present

## 2018-06-20 DIAGNOSIS — E669 Obesity, unspecified: Secondary | ICD-10-CM | POA: Diagnosis not present

## 2018-06-20 IMAGING — CT CT ABD-PELV W/ CM
2 of 5 series · 16 of 46 positions shown, 18 images · IV contrast (ISOVUE)
Comparison: CT of the abdomen and pelvis from 10/08/2015

CLINICAL DATA: Acute onset of diarrhea.  Initial encounter.

EXAM:
CT ABDOMEN AND PELVIS WITH CONTRAST
TECHNIQUE: Multidetector CT imaging of the abdomen and pelvis was performed
using the standard protocol following bolus administration of
intravenous contrast.
CONTRAST:  100mL [id number] IOPAMIDOL ([id number]) INJECTION 61%

[Series 2: abd/pel with · axial · 0.98mm/px · z∈[+724,+1144]mm · 13 of 96 slices shown, 15 images]
[im 6/96  soft-tissue]
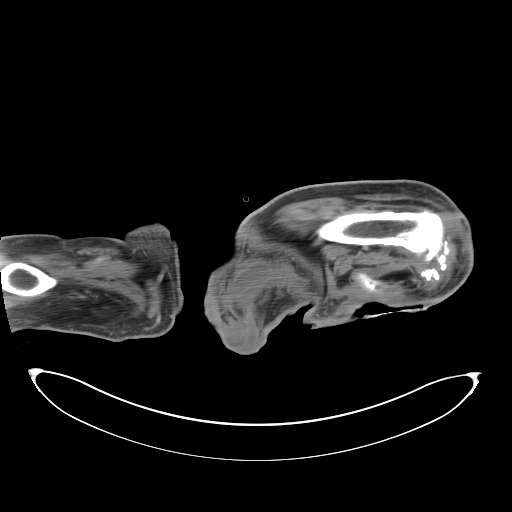
[im 6/96  bone]
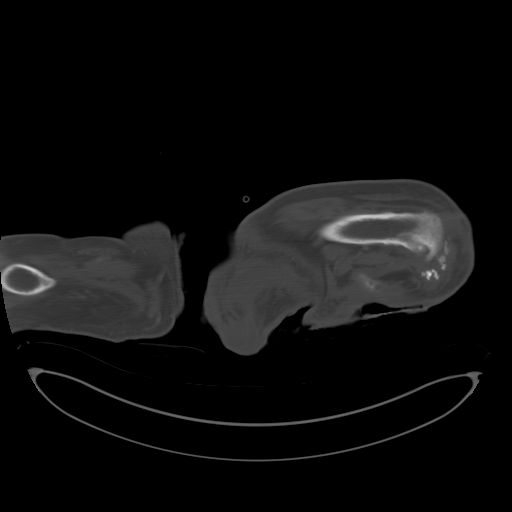
[im 12/96  soft-tissue]
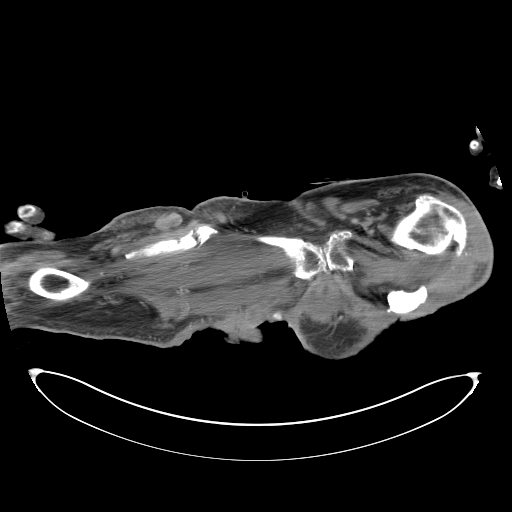
[im 18/96  soft-tissue]
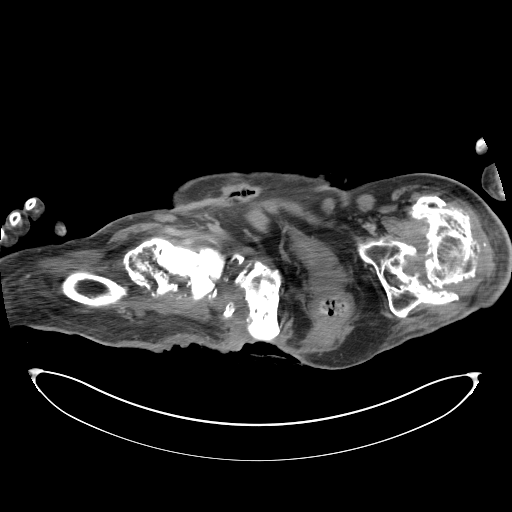
[im 30/96  soft-tissue]
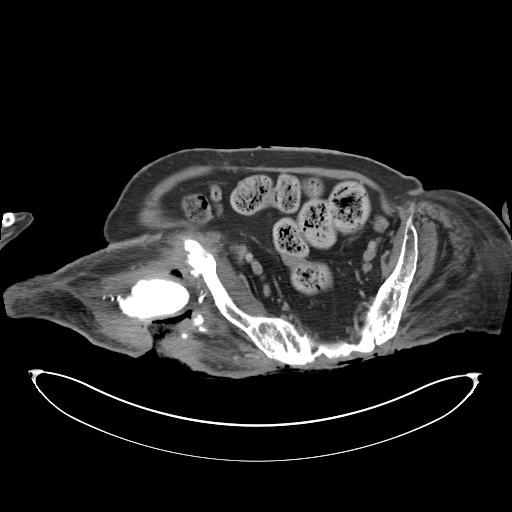
[im 36/96  soft-tissue]
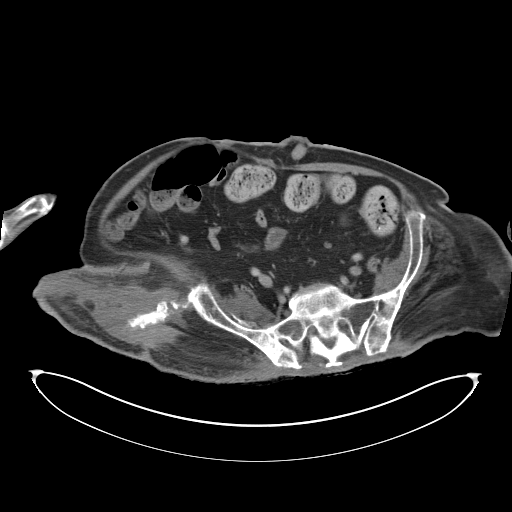
[im 42/96  soft-tissue]
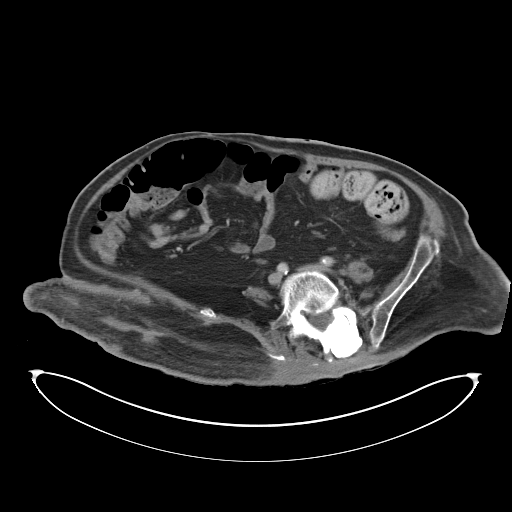
[im 48/96  soft-tissue]
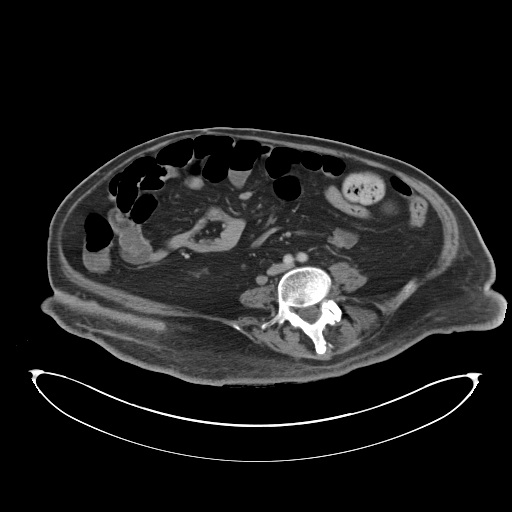
[im 54/96  soft-tissue]
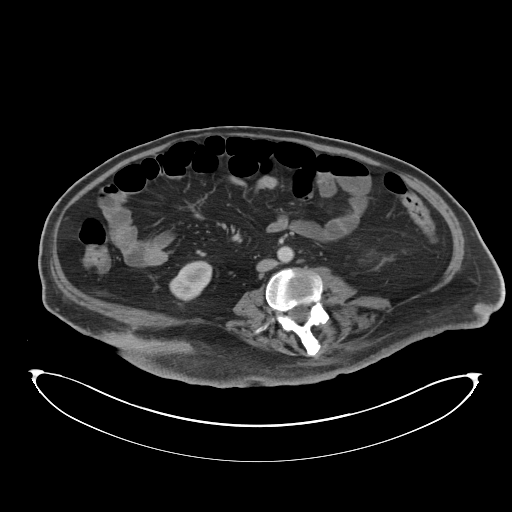
[im 60/96  soft-tissue]
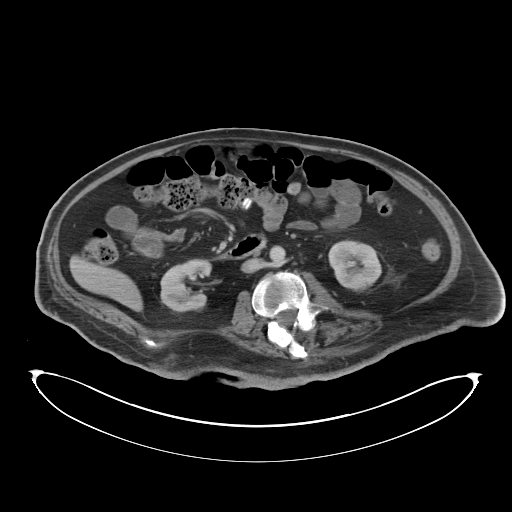
[im 60/96  bone]
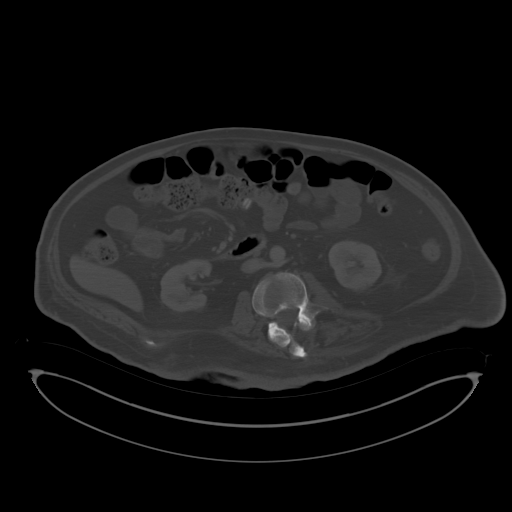
[im 66/96  soft-tissue]
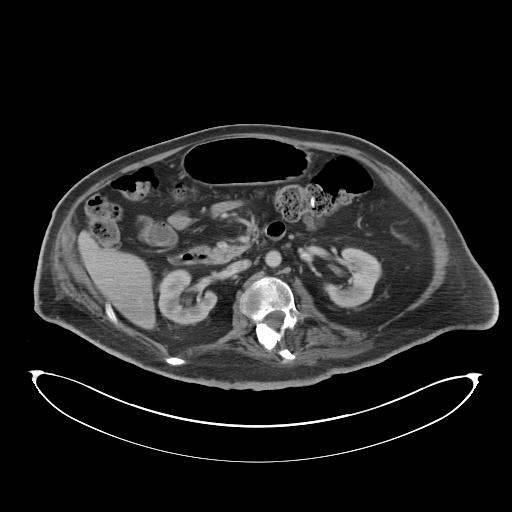
[im 78/96  soft-tissue]
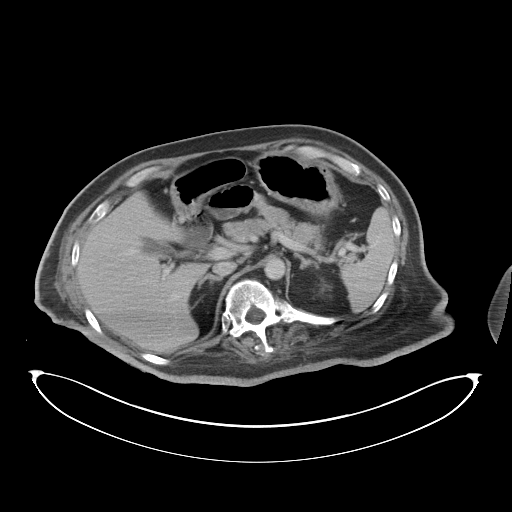
[im 84/96  soft-tissue]
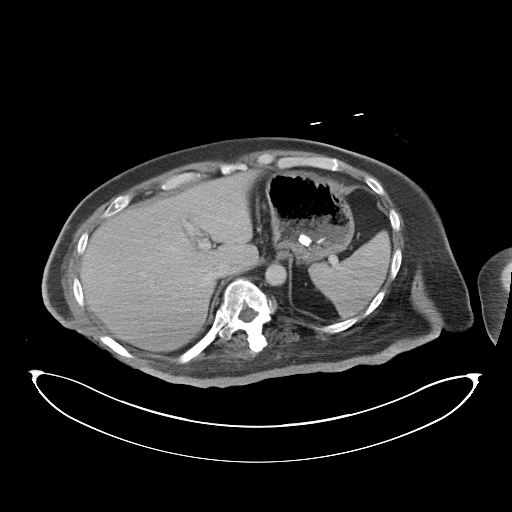
[im 90/96  soft-tissue]
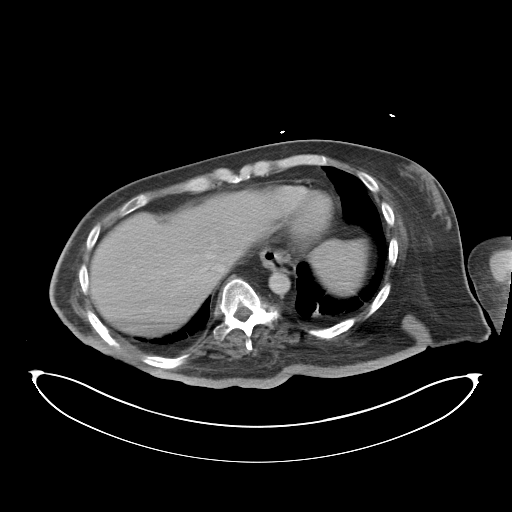

[Series 4: coronal a/|p · coronal · 0.84mm/px · 3 of 178 slices shown]
[im 60/178  soft-tissue]
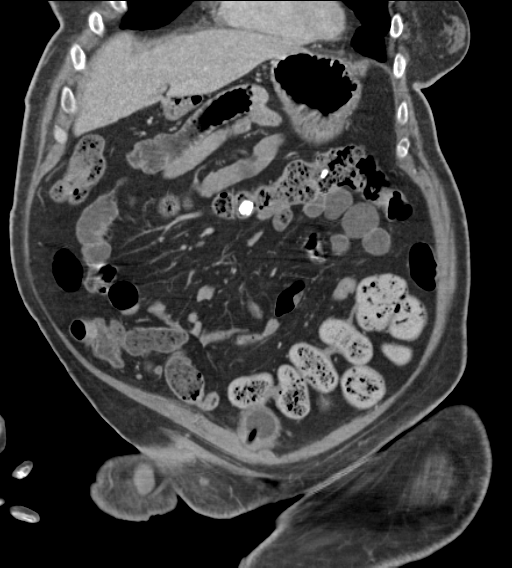
[im 79/178  soft-tissue]
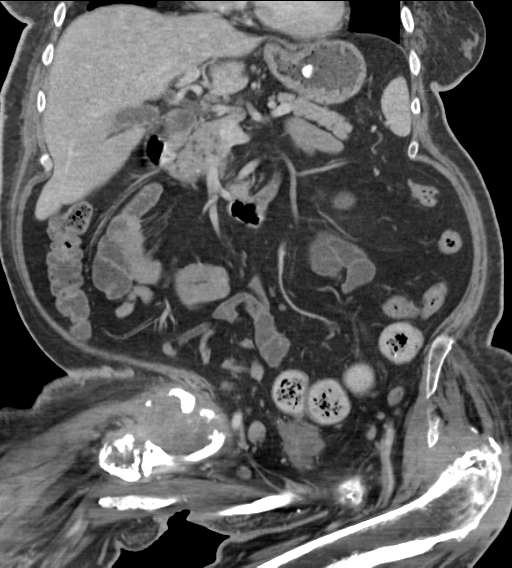
[im 99/178  soft-tissue]
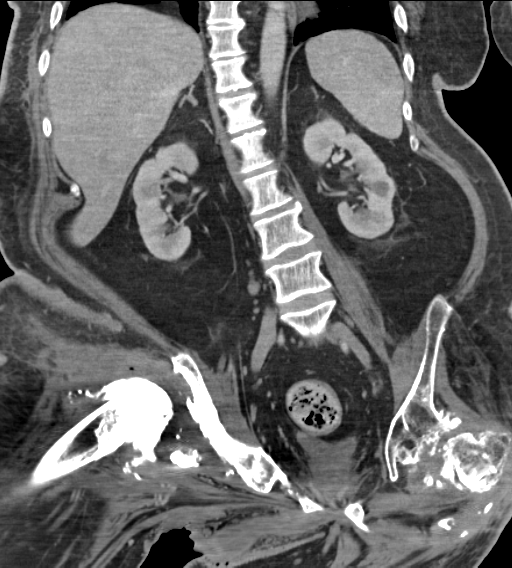

[16 of 46 positions shown; findings below may reference images not displayed]

FINDINGS: Mild left basilar atelectasis or scarring is noted.

The liver and spleen are unremarkable in appearance. The gallbladder
is within normal limits. The pancreas and adrenal glands are
unremarkable.

A 1.2 cm left renal cyst is noted. Nonspecific perinephric stranding
is noted bilaterally, more prominent on the left. There is no
evidence of hydronephrosis. No renal or ureteral stones are
identified.

No free fluid is identified. The small bowel is unremarkable in
appearance. The stomach is within normal limits. No acute vascular
abnormalities are seen.

The appendix is normal in caliber, without evidence of appendicitis.
A lower abdominal colostomy site is noted. The relatively long
Hartmann's pouch is partially filled with stool and unremarkable in
appearance.

Mild apparent wall thickening along the rectum may reflect sequelae
of chronic inflammation.

The bladder is decompressed, with a suprapubic catheter in place.
The prostate remains normal in size. No inguinal lymphadenopathy is
seen.

There is severe chronic deformity and heterotopic bone formation
about both hips, with chronic bilateral dislocation and surrounding
chronic soft tissue inflammation. Underlying decubitus ulcerations
are seen, extending to the level of the bone. Associated chronic
changes of osteomyelitis are again noted.
IMPRESSION: 1. No acute abnormality seen to explain the patient's diarrhea. The
colostomy is unremarkable in appearance.
2. Mild apparent wall thickening along the rectum is relatively
stable and may reflect sequelae of chronic inflammation.
3. Small left renal cyst noted.
4. Mild left basilar atelectasis or scarring noted.
5. Severe chronic deformity and heterotopic bone formation about
both hips, with chronic bilateral dislocation and chronic
surrounding soft tissue inflammation. Underlying large decubitus
ulcerations extend to the level of the bone. Associated chronic
changes of osteomyelitis again noted.

## 2018-06-21 ENCOUNTER — Encounter (HOSPITAL_BASED_OUTPATIENT_CLINIC_OR_DEPARTMENT_OTHER): Payer: Medicare Other | Attending: Internal Medicine

## 2018-06-21 DIAGNOSIS — I10 Essential (primary) hypertension: Secondary | ICD-10-CM | POA: Insufficient documentation

## 2018-06-21 DIAGNOSIS — G8250 Quadriplegia, unspecified: Secondary | ICD-10-CM | POA: Insufficient documentation

## 2018-06-21 DIAGNOSIS — L89323 Pressure ulcer of left buttock, stage 3: Secondary | ICD-10-CM | POA: Insufficient documentation

## 2018-06-21 DIAGNOSIS — L89154 Pressure ulcer of sacral region, stage 4: Secondary | ICD-10-CM | POA: Insufficient documentation

## 2018-06-21 DIAGNOSIS — L89224 Pressure ulcer of left hip, stage 4: Secondary | ICD-10-CM | POA: Insufficient documentation

## 2018-06-21 DIAGNOSIS — L89214 Pressure ulcer of right hip, stage 4: Secondary | ICD-10-CM | POA: Insufficient documentation

## 2018-06-21 DIAGNOSIS — L89104 Pressure ulcer of unspecified part of back, stage 4: Secondary | ICD-10-CM | POA: Insufficient documentation

## 2018-06-21 DIAGNOSIS — G4730 Sleep apnea, unspecified: Secondary | ICD-10-CM | POA: Insufficient documentation

## 2018-06-21 DIAGNOSIS — M86352 Chronic multifocal osteomyelitis, left femur: Secondary | ICD-10-CM | POA: Insufficient documentation

## 2018-06-22 DIAGNOSIS — Z933 Colostomy status: Secondary | ICD-10-CM | POA: Diagnosis not present

## 2018-06-22 DIAGNOSIS — G8254 Quadriplegia, C5-C7 incomplete: Secondary | ICD-10-CM | POA: Diagnosis not present

## 2018-06-22 DIAGNOSIS — Z87440 Personal history of urinary (tract) infections: Secondary | ICD-10-CM | POA: Diagnosis not present

## 2018-06-22 DIAGNOSIS — L89114 Pressure ulcer of right upper back, stage 4: Secondary | ICD-10-CM | POA: Diagnosis not present

## 2018-06-22 DIAGNOSIS — L89314 Pressure ulcer of right buttock, stage 4: Secondary | ICD-10-CM | POA: Diagnosis not present

## 2018-06-22 DIAGNOSIS — K210 Gastro-esophageal reflux disease with esophagitis: Secondary | ICD-10-CM | POA: Diagnosis not present

## 2018-06-22 DIAGNOSIS — G4733 Obstructive sleep apnea (adult) (pediatric): Secondary | ICD-10-CM | POA: Diagnosis not present

## 2018-06-22 DIAGNOSIS — Z436 Encounter for attention to other artificial openings of urinary tract: Secondary | ICD-10-CM | POA: Diagnosis not present

## 2018-06-22 DIAGNOSIS — L89324 Pressure ulcer of left buttock, stage 4: Secondary | ICD-10-CM | POA: Diagnosis not present

## 2018-06-22 DIAGNOSIS — D509 Iron deficiency anemia, unspecified: Secondary | ICD-10-CM | POA: Diagnosis not present

## 2018-06-22 DIAGNOSIS — Z466 Encounter for fitting and adjustment of urinary device: Secondary | ICD-10-CM | POA: Diagnosis not present

## 2018-06-22 DIAGNOSIS — E669 Obesity, unspecified: Secondary | ICD-10-CM | POA: Diagnosis not present

## 2018-06-22 DIAGNOSIS — L89214 Pressure ulcer of right hip, stage 4: Secondary | ICD-10-CM | POA: Diagnosis not present

## 2018-06-22 DIAGNOSIS — I10 Essential (primary) hypertension: Secondary | ICD-10-CM | POA: Diagnosis not present

## 2018-06-22 DIAGNOSIS — L89154 Pressure ulcer of sacral region, stage 4: Secondary | ICD-10-CM | POA: Diagnosis not present

## 2018-06-27 DIAGNOSIS — G8254 Quadriplegia, C5-C7 incomplete: Secondary | ICD-10-CM | POA: Diagnosis not present

## 2018-06-27 DIAGNOSIS — Z466 Encounter for fitting and adjustment of urinary device: Secondary | ICD-10-CM | POA: Diagnosis not present

## 2018-06-27 DIAGNOSIS — L89154 Pressure ulcer of sacral region, stage 4: Secondary | ICD-10-CM | POA: Diagnosis not present

## 2018-06-27 DIAGNOSIS — L89114 Pressure ulcer of right upper back, stage 4: Secondary | ICD-10-CM | POA: Diagnosis not present

## 2018-06-27 DIAGNOSIS — L89314 Pressure ulcer of right buttock, stage 4: Secondary | ICD-10-CM | POA: Diagnosis not present

## 2018-06-27 DIAGNOSIS — K210 Gastro-esophageal reflux disease with esophagitis: Secondary | ICD-10-CM | POA: Diagnosis not present

## 2018-06-27 DIAGNOSIS — G4733 Obstructive sleep apnea (adult) (pediatric): Secondary | ICD-10-CM | POA: Diagnosis not present

## 2018-06-27 DIAGNOSIS — Z933 Colostomy status: Secondary | ICD-10-CM | POA: Diagnosis not present

## 2018-06-27 DIAGNOSIS — L89324 Pressure ulcer of left buttock, stage 4: Secondary | ICD-10-CM | POA: Diagnosis not present

## 2018-06-27 DIAGNOSIS — D509 Iron deficiency anemia, unspecified: Secondary | ICD-10-CM | POA: Diagnosis not present

## 2018-06-27 DIAGNOSIS — I10 Essential (primary) hypertension: Secondary | ICD-10-CM | POA: Diagnosis not present

## 2018-06-27 DIAGNOSIS — L89214 Pressure ulcer of right hip, stage 4: Secondary | ICD-10-CM | POA: Diagnosis not present

## 2018-06-27 DIAGNOSIS — E669 Obesity, unspecified: Secondary | ICD-10-CM | POA: Diagnosis not present

## 2018-06-28 DIAGNOSIS — L89224 Pressure ulcer of left hip, stage 4: Secondary | ICD-10-CM | POA: Diagnosis not present

## 2018-06-28 DIAGNOSIS — G4730 Sleep apnea, unspecified: Secondary | ICD-10-CM | POA: Diagnosis not present

## 2018-06-28 DIAGNOSIS — I10 Essential (primary) hypertension: Secondary | ICD-10-CM | POA: Diagnosis not present

## 2018-06-28 DIAGNOSIS — L89154 Pressure ulcer of sacral region, stage 4: Secondary | ICD-10-CM | POA: Diagnosis not present

## 2018-06-28 DIAGNOSIS — L89219 Pressure ulcer of right hip, unspecified stage: Secondary | ICD-10-CM | POA: Diagnosis not present

## 2018-06-28 DIAGNOSIS — M86352 Chronic multifocal osteomyelitis, left femur: Secondary | ICD-10-CM | POA: Diagnosis not present

## 2018-06-28 DIAGNOSIS — G8250 Quadriplegia, unspecified: Secondary | ICD-10-CM | POA: Diagnosis not present

## 2018-06-28 DIAGNOSIS — L89104 Pressure ulcer of unspecified part of back, stage 4: Secondary | ICD-10-CM | POA: Diagnosis not present

## 2018-06-28 DIAGNOSIS — L89894 Pressure ulcer of other site, stage 4: Secondary | ICD-10-CM | POA: Diagnosis not present

## 2018-06-28 DIAGNOSIS — L89323 Pressure ulcer of left buttock, stage 3: Secondary | ICD-10-CM | POA: Diagnosis not present

## 2018-06-28 DIAGNOSIS — L89214 Pressure ulcer of right hip, stage 4: Secondary | ICD-10-CM | POA: Diagnosis not present

## 2018-06-29 DIAGNOSIS — Z466 Encounter for fitting and adjustment of urinary device: Secondary | ICD-10-CM | POA: Diagnosis not present

## 2018-06-29 DIAGNOSIS — L89114 Pressure ulcer of right upper back, stage 4: Secondary | ICD-10-CM | POA: Diagnosis not present

## 2018-06-29 DIAGNOSIS — K210 Gastro-esophageal reflux disease with esophagitis: Secondary | ICD-10-CM | POA: Diagnosis not present

## 2018-06-29 DIAGNOSIS — L89214 Pressure ulcer of right hip, stage 4: Secondary | ICD-10-CM | POA: Diagnosis not present

## 2018-06-29 DIAGNOSIS — L89324 Pressure ulcer of left buttock, stage 4: Secondary | ICD-10-CM | POA: Diagnosis not present

## 2018-06-29 DIAGNOSIS — Z933 Colostomy status: Secondary | ICD-10-CM | POA: Diagnosis not present

## 2018-06-29 DIAGNOSIS — L89154 Pressure ulcer of sacral region, stage 4: Secondary | ICD-10-CM | POA: Diagnosis not present

## 2018-06-29 DIAGNOSIS — L89314 Pressure ulcer of right buttock, stage 4: Secondary | ICD-10-CM | POA: Diagnosis not present

## 2018-06-29 DIAGNOSIS — G8254 Quadriplegia, C5-C7 incomplete: Secondary | ICD-10-CM | POA: Diagnosis not present

## 2018-06-29 DIAGNOSIS — D509 Iron deficiency anemia, unspecified: Secondary | ICD-10-CM | POA: Diagnosis not present

## 2018-06-29 DIAGNOSIS — I10 Essential (primary) hypertension: Secondary | ICD-10-CM | POA: Diagnosis not present

## 2018-06-29 DIAGNOSIS — E669 Obesity, unspecified: Secondary | ICD-10-CM | POA: Diagnosis not present

## 2018-06-29 DIAGNOSIS — G4733 Obstructive sleep apnea (adult) (pediatric): Secondary | ICD-10-CM | POA: Diagnosis not present

## 2018-07-03 IMAGING — DX DG CHEST 2V
3 series · 3 of 3 positions shown · non-contrast
Comparison: Lung bases and CT abdomen earlier this day. Most recent
chest radiograph 07/08/2015. Chest CT 10/26/2010 reviewed.

CLINICAL DATA: Chest congestion.

EXAM:
CHEST  2 VIEW

[chest lat]
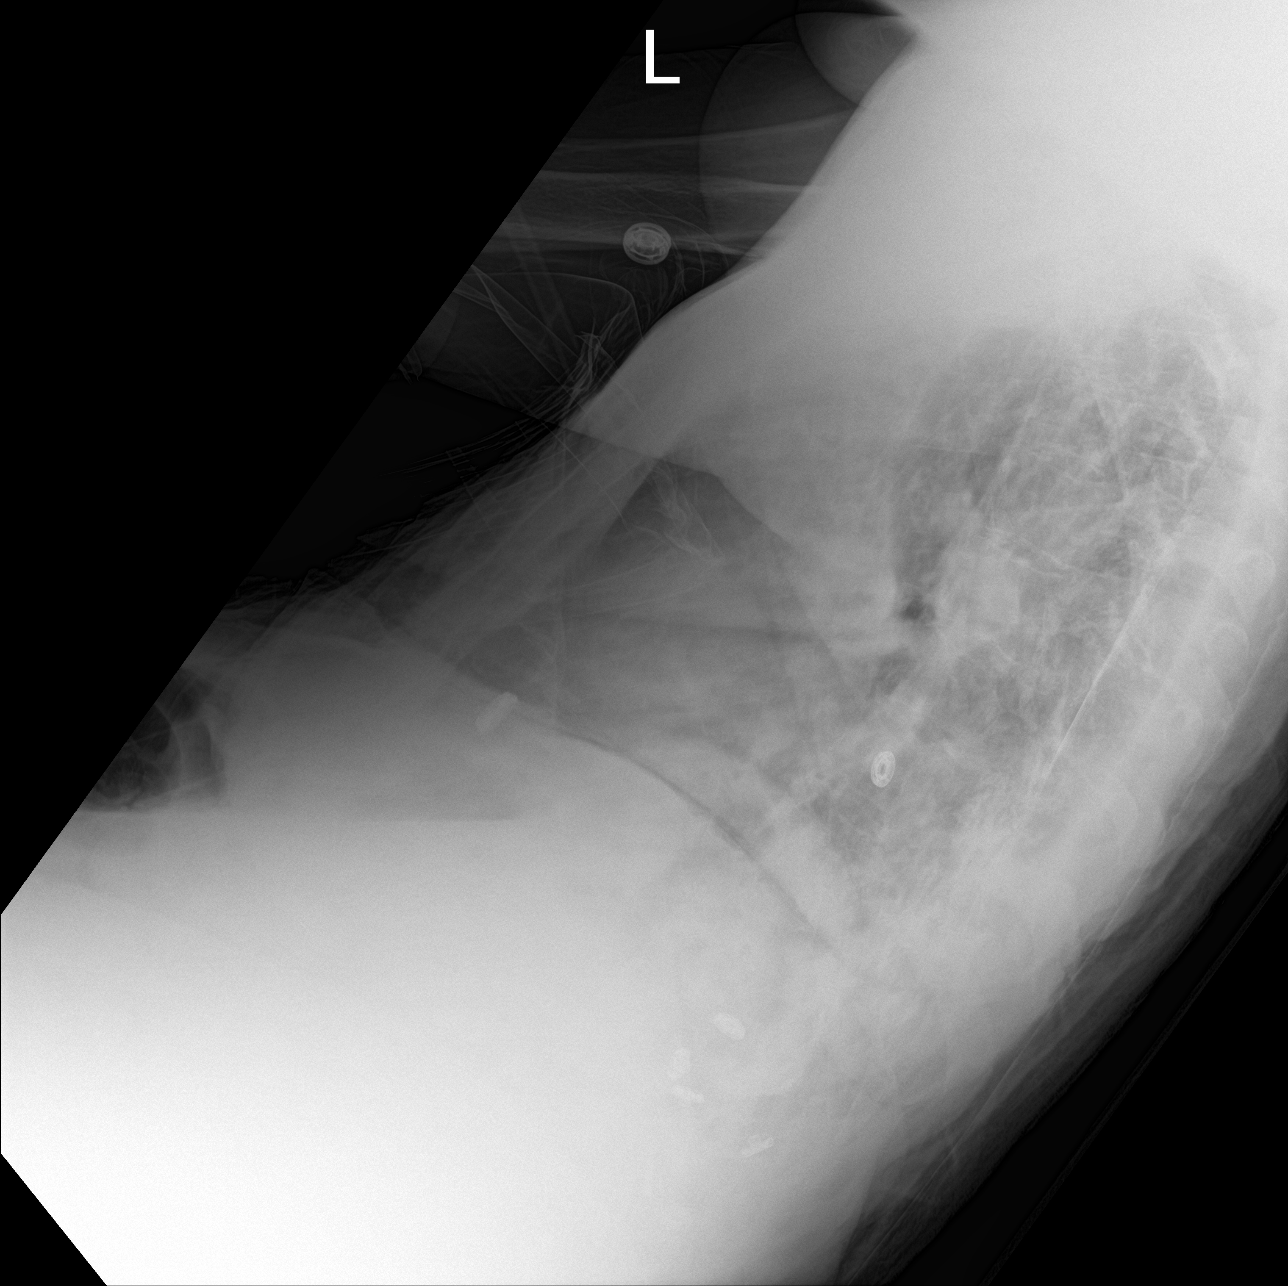

[chest ap (1 of 2)]
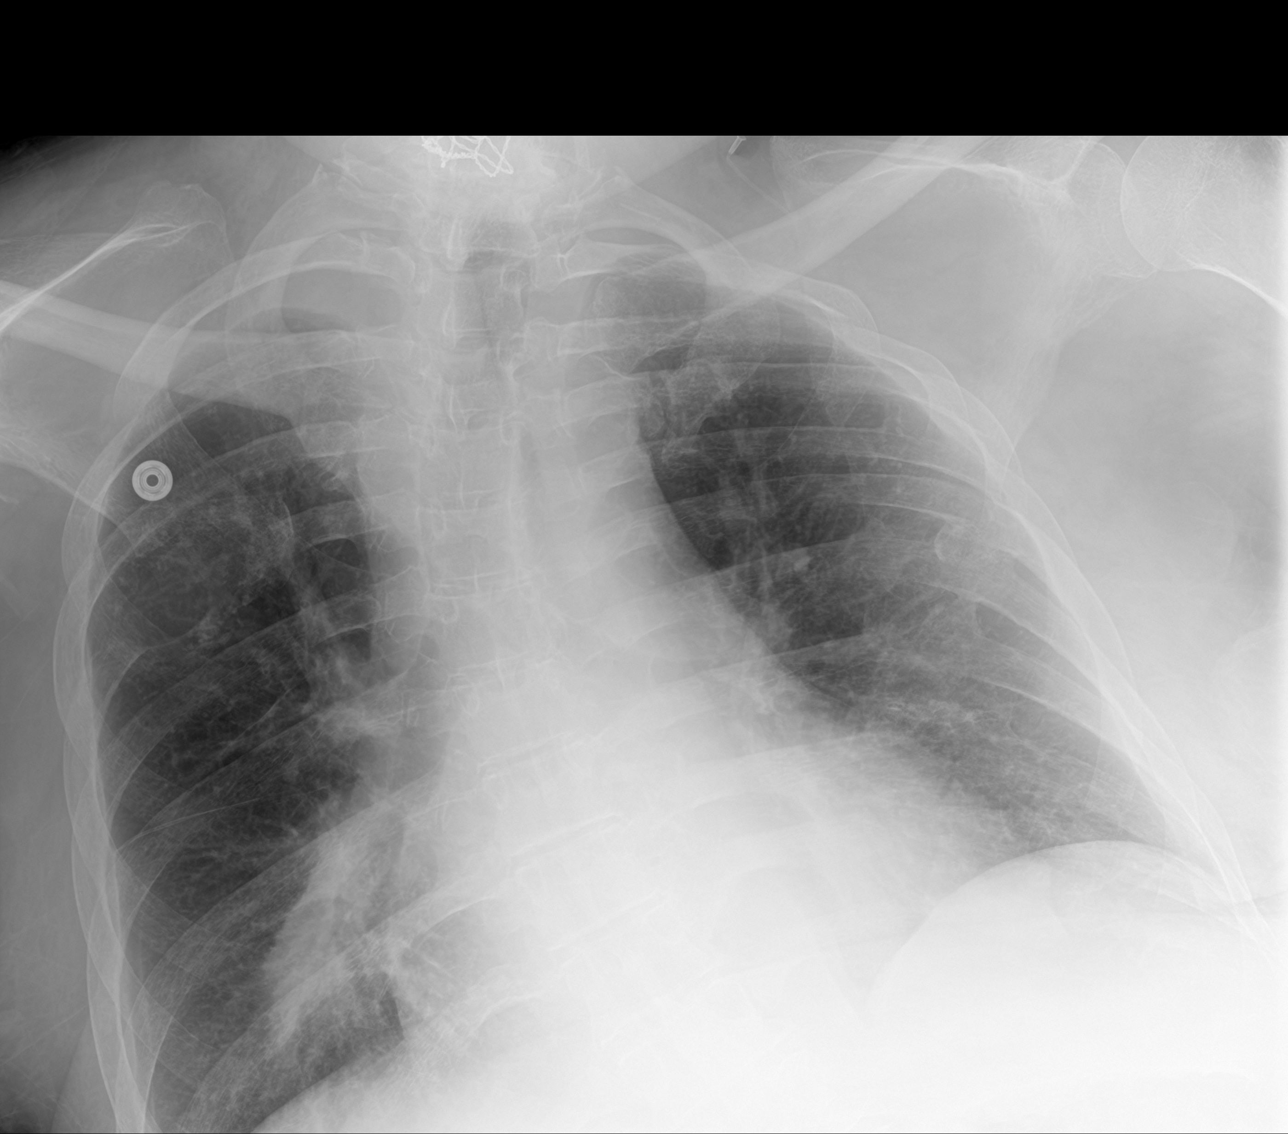

[chest ap (2 of 2)]
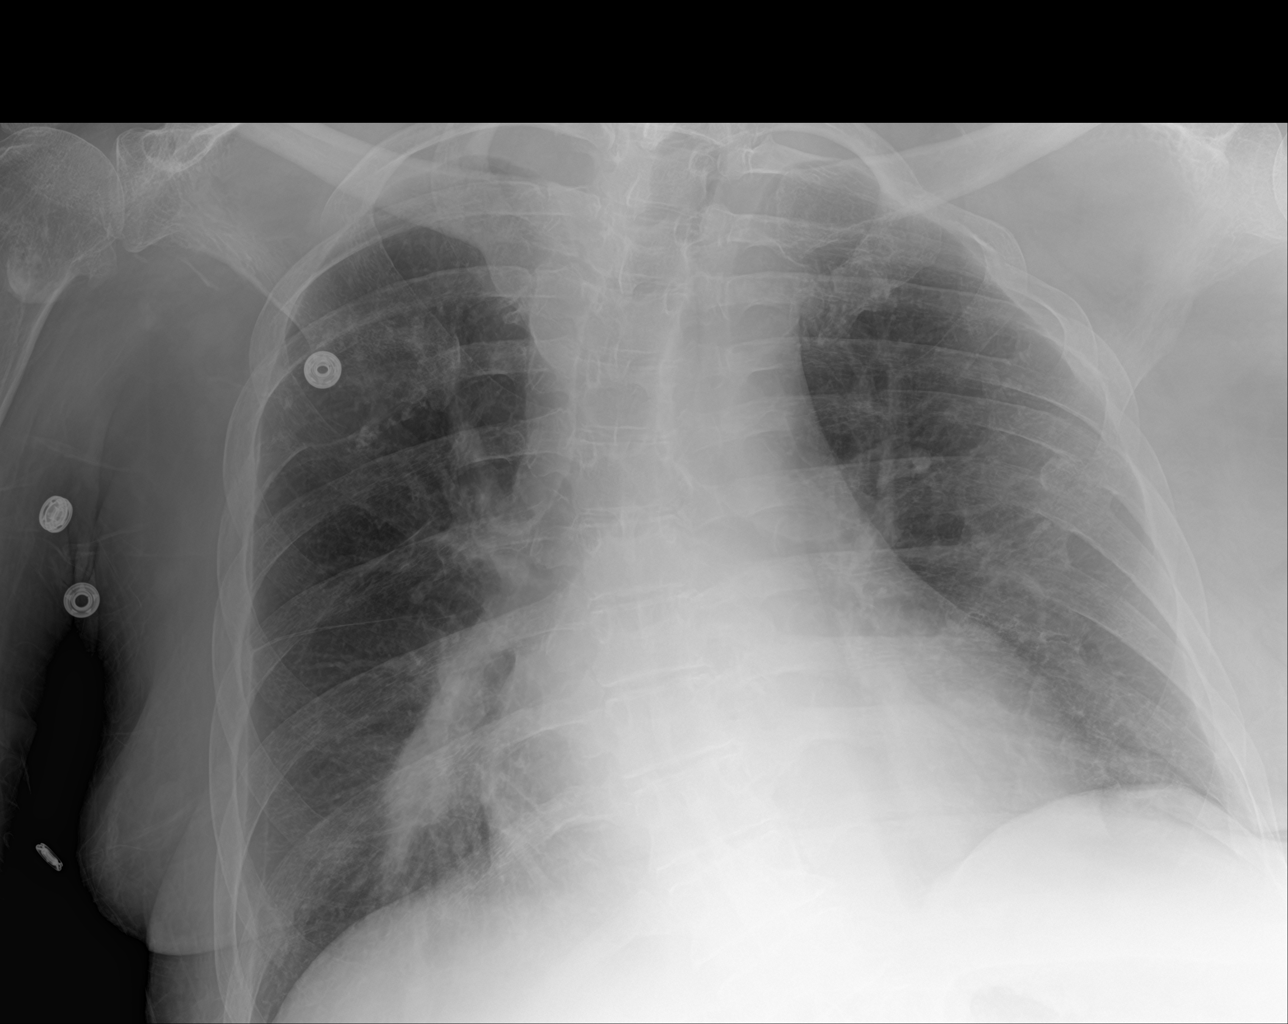

[3 of 3 positions shown; findings below may reference images not displayed]

FINDINGS: Streaky and linear bibasilar opacities, right greater than left.
Cardiomegaly is unchanged. There is no pulmonary edema. Biapical
pleural parenchymal thickening appears unchanged from prior exams.
No pleural effusion. No pneumothorax. There are old left rib
fractures. Abnormal appearance of the right posterior rib this
likely chronic pressure erosion second due to adjacent scapular
lesion as seen on prior chest CT. No suspicious characteristics, in
this is unchanged radiographically over multiple recent radiographs.
Bony findings are stable over the course of at least 2 years.
IMPRESSION: Streaky and linear bibasilar opacities, right greater than left,
atelectasis versus pneumonia. Given distribution, aspiration is
considered.

## 2018-07-03 IMAGING — CT CT ABD-PELV W/O CM
2 of 4 series · 16 of 46 positions shown, 18 images · non-contrast
Comparison: 11/06/2015 and multiple previous

CLINICAL DATA: Paraplegia. Worsening chest congestion.
Osteomyelitis.

EXAM:
CT ABDOMEN AND PELVIS WITHOUT CONTRAST
TECHNIQUE: Multidetector CT imaging of the abdomen and pelvis was performed
following the standard protocol without IV contrast.

[Series 2: a/p w/o 5mm · axial · non-contrast · 0.89mm/px · z∈[-749,-339]mm · 13 of 90 slices shown, 15 images]
[im 4/90  soft-tissue]
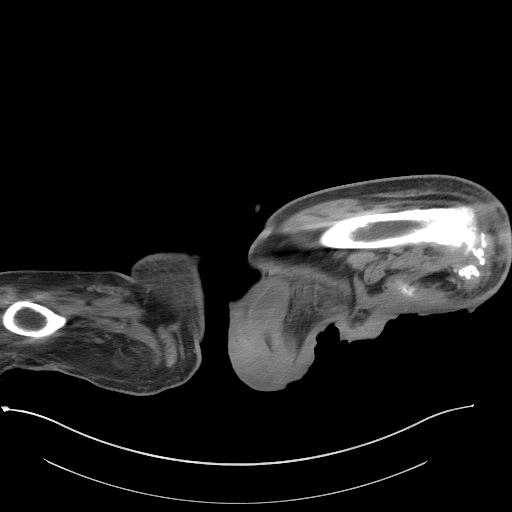
[im 4/90  bone]
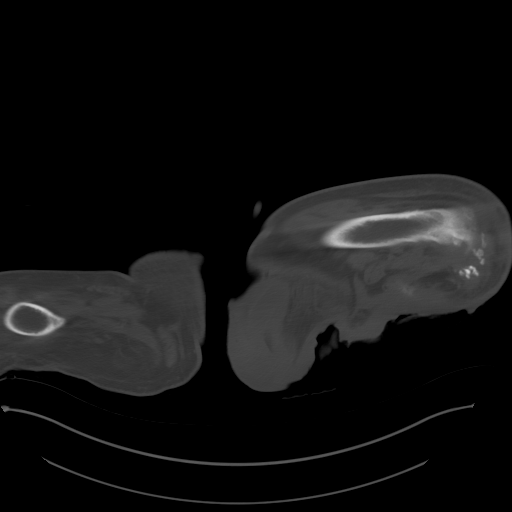
[im 11/90  soft-tissue]
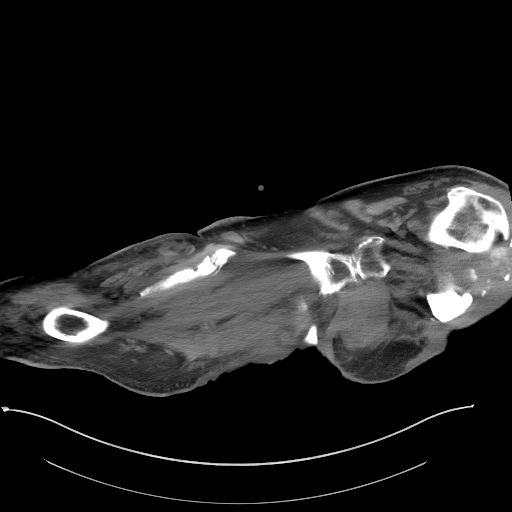
[im 18/90  soft-tissue]
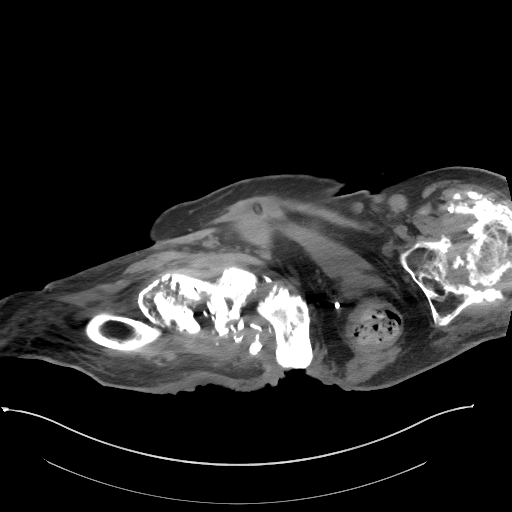
[im 24/90  soft-tissue]
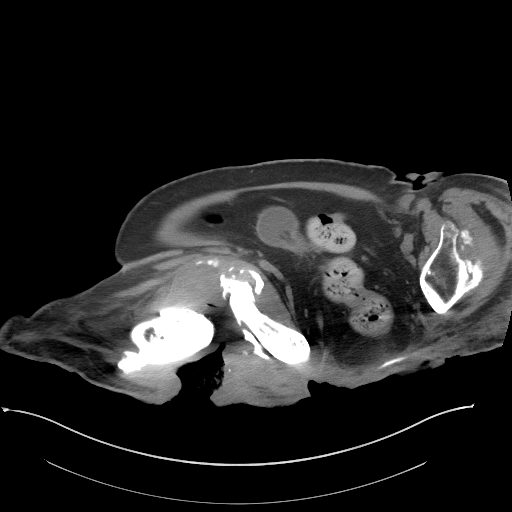
[im 31/90  soft-tissue]
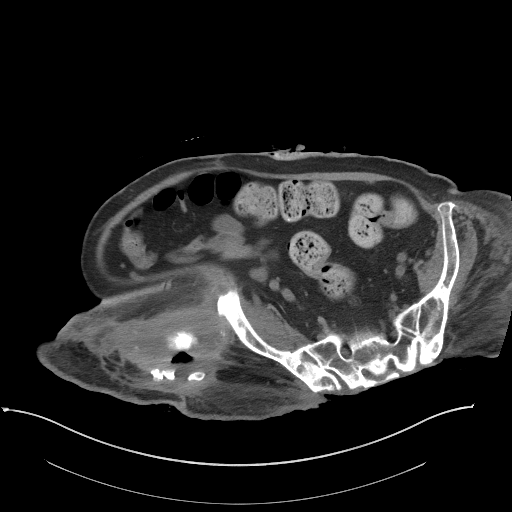
[im 38/90  soft-tissue]
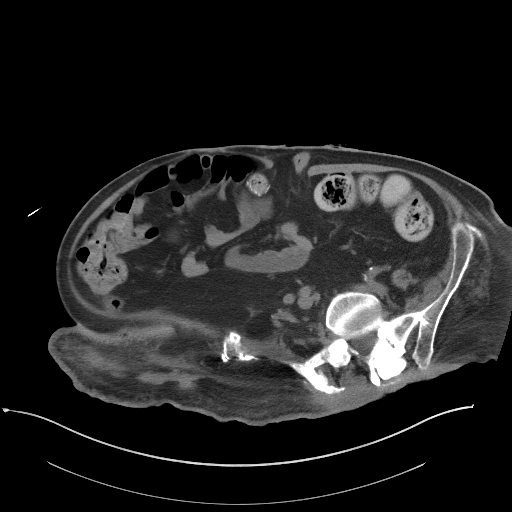
[im 45/90  soft-tissue]
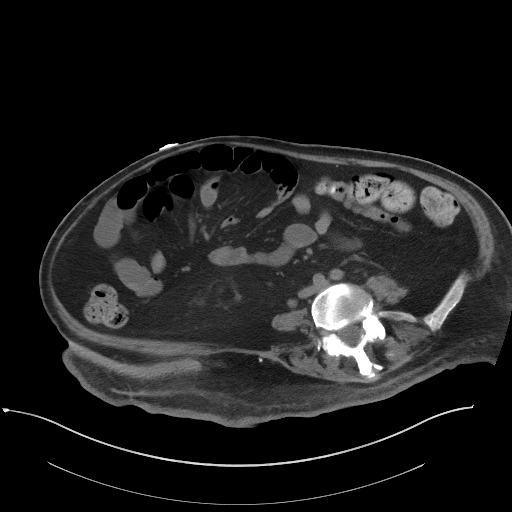
[im 52/90  soft-tissue]
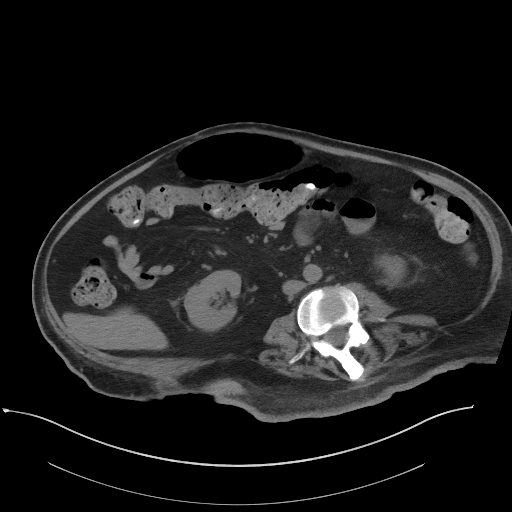
[im 59/90  soft-tissue]
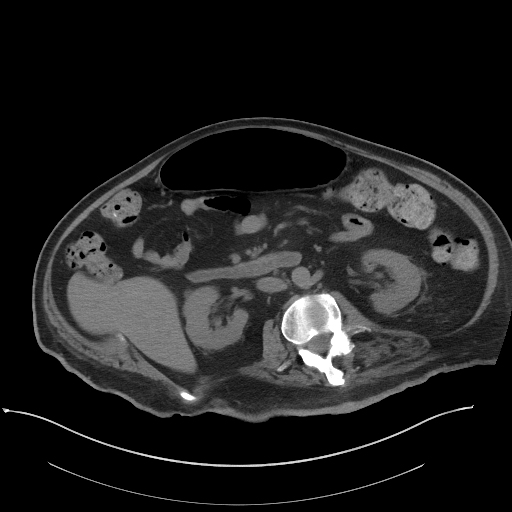
[im 59/90  bone]
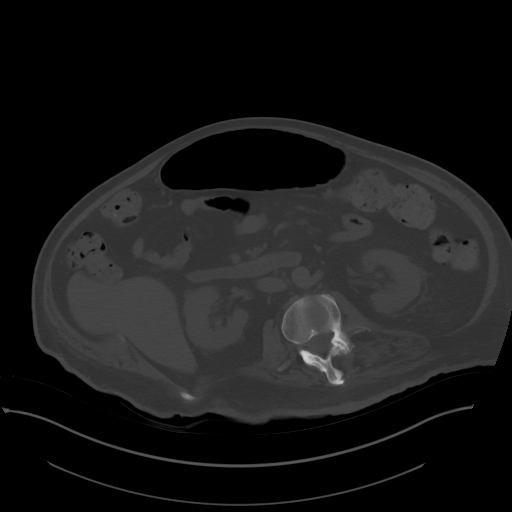
[im 66/90  soft-tissue]
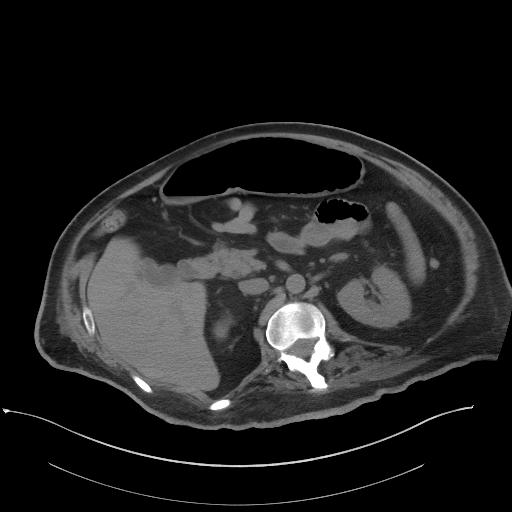
[im 72/90  soft-tissue]
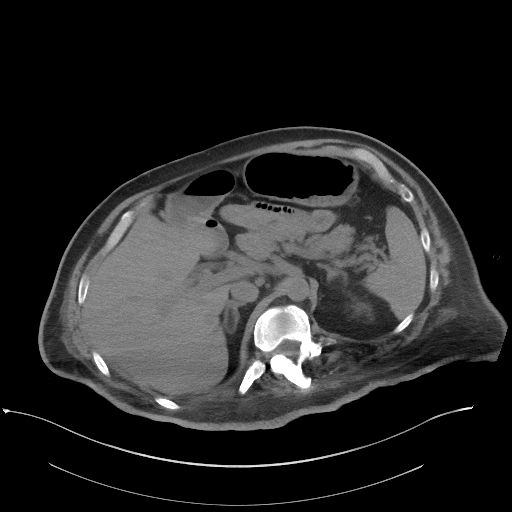
[im 79/90  soft-tissue]
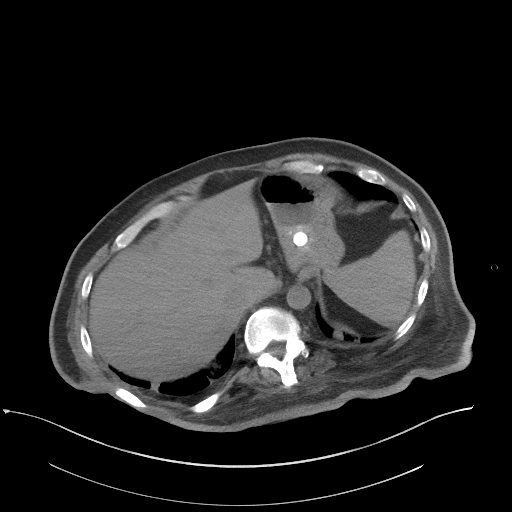
[im 86/90  soft-tissue]
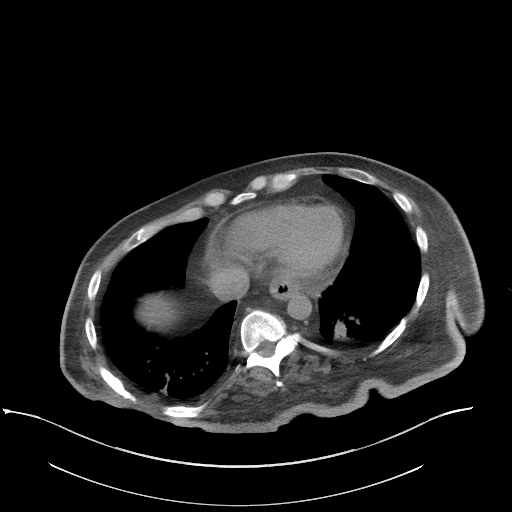

[Series 5: a/p w/o cor · coronal · non-contrast · 0.90mm/px · 3 of 132 slices shown]
[im 44/132  soft-tissue]
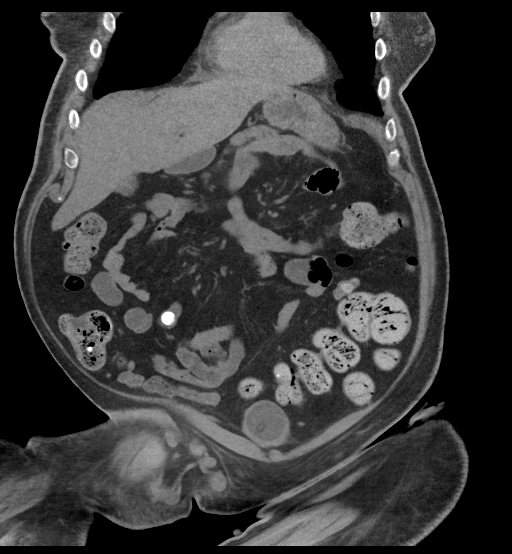
[im 59/132  soft-tissue]
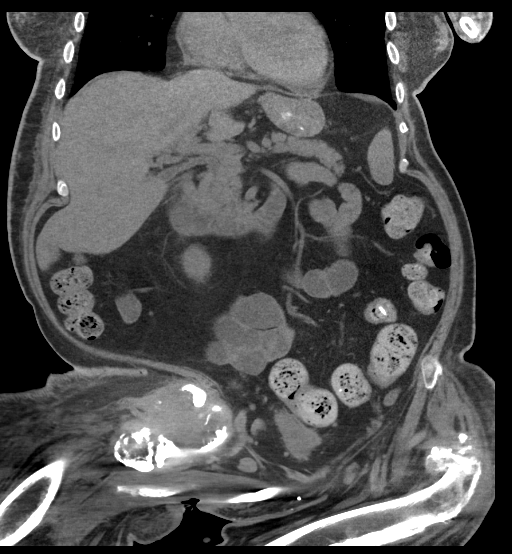
[im 73/132  soft-tissue]
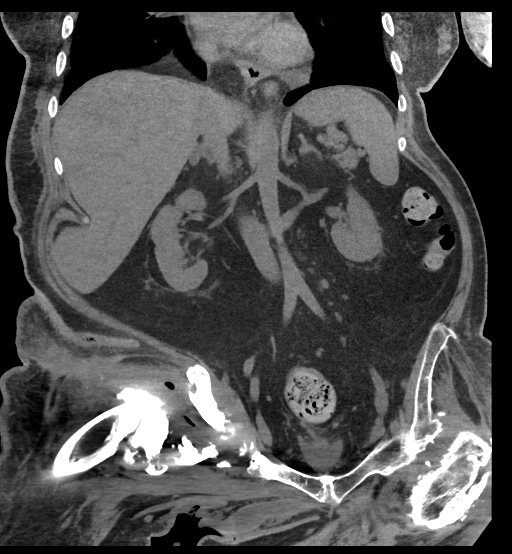

[16 of 46 positions shown; findings below may reference images not displayed]

FINDINGS: Worsened patchy density in both lower lobes consistent with worsened
lower lobe atelectasis/ pneumonia. Findings are more pronounced on
the right. Decubitus ulceration over the right lower ribcage with
sclerotic changes of the right ninth rib consistent with chronic
osteomyelitis.

The liver appears normal. No calcified gallstones. The spleen is
normal. The pancreas is normal. The adrenal glands are normal. The
kidneys appear normal without contrast. 12 mm cyst in the midportion
on the left not visible without contrast. The aorta and IVC are
normal. No acute bowel pathology. Moderate amount of fecal matter in
the left colon. Suprapubic catheter in place within the bladder.
Ileostomy appears unremarkable.

Chronic fusion and degenerative changes of the spine without
evidence of spinal osteomyelitis. Multiple decubitus ulcers
overlying the sacrum and iliac bones with evidence of chronic
osteomyelitis/ bone erosion of the posterior aspects of the iliac
bones and sacrum. Left hip shows chronic [place] pathic changes and
subluxation. Decubitus ulcer overlying the region of the ischium
with chronic changes of osteomyelitis. No air/ gas in the hip joint
on the left. On the right, decubitus ulcers overlie the ischium with
chronic changes of osteomyelitis. Right hip shows subluxation and
chronic arthro pathic changes including a deep ulcer that appears to
be in communication with the joint. Small amount of air in the
joint. Osteomyelitis quite likely in the region of the hip joint and
regional bones.

All these findings appear the same as they did 2 weeks ago.
IMPRESSION: Worsened patchy density in both lower lobes consistent with lower
lobe pneumonia/ atelectasis. Findings are worse on the right.

Otherwise, no change since the study of 2 weeks ago. No evidence of
acute abdominal organ pathology.

Multiple decubitus ulcers overlying the pelvic bones and deep
ulceration extending towards the right hip joint. Changes of chronic
osteomyelitis of the pelvis. Chronic arthro pathic changes of both
hip joints likely to represent chronic osteomyelitis and chronic
neuropathic joints. Deep ulceration is in obvious direct
communication with the right hip joint.

## 2018-07-04 DIAGNOSIS — E669 Obesity, unspecified: Secondary | ICD-10-CM | POA: Diagnosis not present

## 2018-07-04 DIAGNOSIS — L89154 Pressure ulcer of sacral region, stage 4: Secondary | ICD-10-CM | POA: Diagnosis not present

## 2018-07-04 DIAGNOSIS — Z466 Encounter for fitting and adjustment of urinary device: Secondary | ICD-10-CM | POA: Diagnosis not present

## 2018-07-04 DIAGNOSIS — K210 Gastro-esophageal reflux disease with esophagitis: Secondary | ICD-10-CM | POA: Diagnosis not present

## 2018-07-04 DIAGNOSIS — D509 Iron deficiency anemia, unspecified: Secondary | ICD-10-CM | POA: Diagnosis not present

## 2018-07-04 DIAGNOSIS — G4733 Obstructive sleep apnea (adult) (pediatric): Secondary | ICD-10-CM | POA: Diagnosis not present

## 2018-07-04 DIAGNOSIS — L89314 Pressure ulcer of right buttock, stage 4: Secondary | ICD-10-CM | POA: Diagnosis not present

## 2018-07-04 DIAGNOSIS — L89114 Pressure ulcer of right upper back, stage 4: Secondary | ICD-10-CM | POA: Diagnosis not present

## 2018-07-04 DIAGNOSIS — Z933 Colostomy status: Secondary | ICD-10-CM | POA: Diagnosis not present

## 2018-07-04 DIAGNOSIS — G8254 Quadriplegia, C5-C7 incomplete: Secondary | ICD-10-CM | POA: Diagnosis not present

## 2018-07-04 DIAGNOSIS — L89214 Pressure ulcer of right hip, stage 4: Secondary | ICD-10-CM | POA: Diagnosis not present

## 2018-07-04 DIAGNOSIS — I10 Essential (primary) hypertension: Secondary | ICD-10-CM | POA: Diagnosis not present

## 2018-07-04 DIAGNOSIS — L89324 Pressure ulcer of left buttock, stage 4: Secondary | ICD-10-CM | POA: Diagnosis not present

## 2018-07-06 DIAGNOSIS — G8254 Quadriplegia, C5-C7 incomplete: Secondary | ICD-10-CM | POA: Diagnosis not present

## 2018-07-06 DIAGNOSIS — L89214 Pressure ulcer of right hip, stage 4: Secondary | ICD-10-CM | POA: Diagnosis not present

## 2018-07-06 DIAGNOSIS — L89314 Pressure ulcer of right buttock, stage 4: Secondary | ICD-10-CM | POA: Diagnosis not present

## 2018-07-06 DIAGNOSIS — K210 Gastro-esophageal reflux disease with esophagitis: Secondary | ICD-10-CM | POA: Diagnosis not present

## 2018-07-06 DIAGNOSIS — L89154 Pressure ulcer of sacral region, stage 4: Secondary | ICD-10-CM | POA: Diagnosis not present

## 2018-07-06 DIAGNOSIS — G4733 Obstructive sleep apnea (adult) (pediatric): Secondary | ICD-10-CM | POA: Diagnosis not present

## 2018-07-06 DIAGNOSIS — D509 Iron deficiency anemia, unspecified: Secondary | ICD-10-CM | POA: Diagnosis not present

## 2018-07-06 DIAGNOSIS — L89324 Pressure ulcer of left buttock, stage 4: Secondary | ICD-10-CM | POA: Diagnosis not present

## 2018-07-06 DIAGNOSIS — Z933 Colostomy status: Secondary | ICD-10-CM | POA: Diagnosis not present

## 2018-07-06 DIAGNOSIS — L89114 Pressure ulcer of right upper back, stage 4: Secondary | ICD-10-CM | POA: Diagnosis not present

## 2018-07-06 DIAGNOSIS — Z466 Encounter for fitting and adjustment of urinary device: Secondary | ICD-10-CM | POA: Diagnosis not present

## 2018-07-06 DIAGNOSIS — I10 Essential (primary) hypertension: Secondary | ICD-10-CM | POA: Diagnosis not present

## 2018-07-06 DIAGNOSIS — E669 Obesity, unspecified: Secondary | ICD-10-CM | POA: Diagnosis not present

## 2018-07-08 IMAGING — CR DG CHEST 1V PORT
1 series · 1 of 1 positions shown · non-contrast
Comparison: 11/19/2015

CLINICAL DATA: Cough.

EXAM:
PORTABLE CHEST 1 VIEW

[AP]
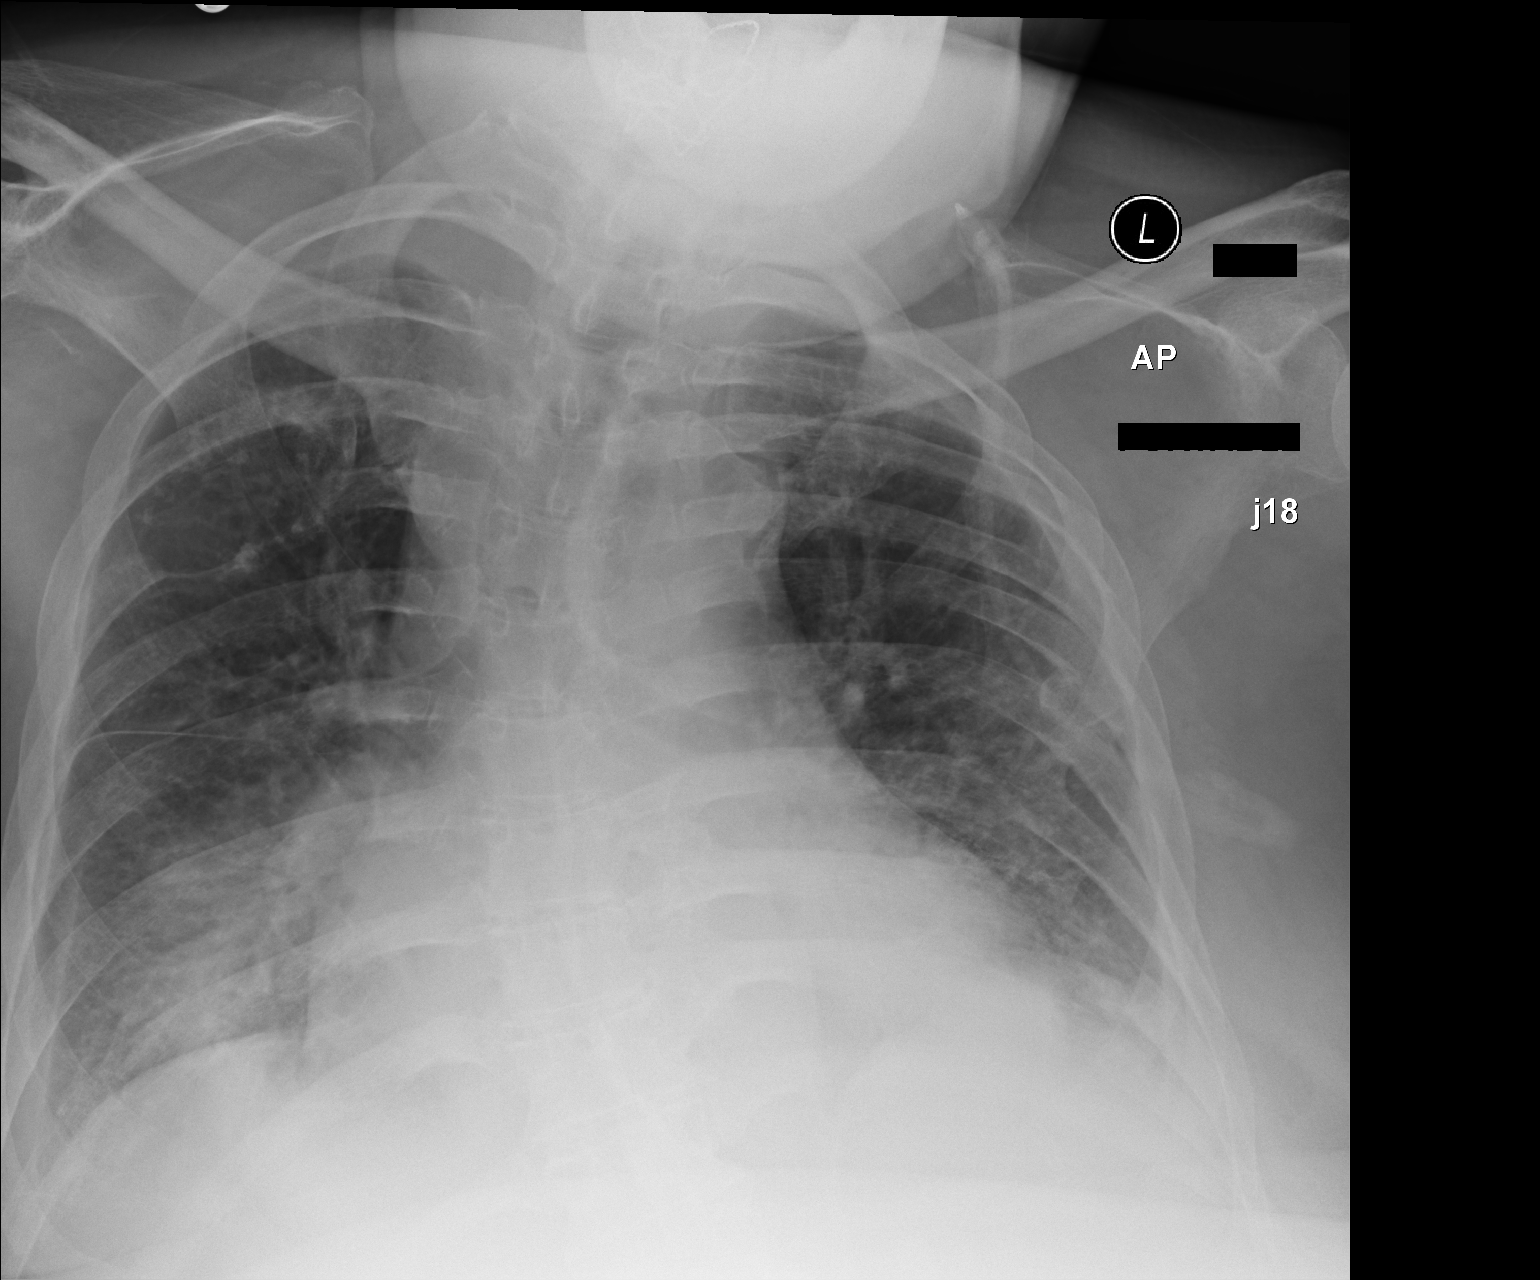

[1 of 1 positions shown; findings below may reference images not displayed]

FINDINGS: Cardiomediastinal silhouette is normal. Mediastinal contours appear
intact.

There is no evidence of pleural effusion or pneumothorax. There is
worsening of bilateral lower lobe patchy airspace consolidation.

Osseous structures are without acute abnormality. Soft tissues are
grossly normal.
IMPRESSION: Worsening of bilateral patchy lower lobe airspace consolidation,
likely representing multifocal pneumonia, possibly secondary to
aspiration.

## 2018-07-12 DIAGNOSIS — L89224 Pressure ulcer of left hip, stage 4: Secondary | ICD-10-CM | POA: Diagnosis not present

## 2018-07-12 DIAGNOSIS — Z87440 Personal history of urinary (tract) infections: Secondary | ICD-10-CM | POA: Diagnosis not present

## 2018-07-12 DIAGNOSIS — G4733 Obstructive sleep apnea (adult) (pediatric): Secondary | ICD-10-CM | POA: Diagnosis not present

## 2018-07-12 DIAGNOSIS — Z743 Need for continuous supervision: Secondary | ICD-10-CM | POA: Diagnosis not present

## 2018-07-12 DIAGNOSIS — L89220 Pressure ulcer of left hip, unstageable: Secondary | ICD-10-CM | POA: Diagnosis not present

## 2018-07-12 DIAGNOSIS — K219 Gastro-esophageal reflux disease without esophagitis: Secondary | ICD-10-CM | POA: Diagnosis not present

## 2018-07-12 DIAGNOSIS — X58XXXA Exposure to other specified factors, initial encounter: Secondary | ICD-10-CM | POA: Diagnosis not present

## 2018-07-12 DIAGNOSIS — M86652 Other chronic osteomyelitis, left thigh: Secondary | ICD-10-CM | POA: Diagnosis not present

## 2018-07-12 DIAGNOSIS — R279 Unspecified lack of coordination: Secondary | ICD-10-CM | POA: Diagnosis not present

## 2018-07-12 DIAGNOSIS — Z9049 Acquired absence of other specified parts of digestive tract: Secondary | ICD-10-CM | POA: Diagnosis not present

## 2018-07-12 DIAGNOSIS — G8250 Quadriplegia, unspecified: Secondary | ICD-10-CM | POA: Diagnosis not present

## 2018-07-12 DIAGNOSIS — L97129 Non-pressure chronic ulcer of left thigh with unspecified severity: Secondary | ICD-10-CM | POA: Diagnosis not present

## 2018-07-12 DIAGNOSIS — N319 Neuromuscular dysfunction of bladder, unspecified: Secondary | ICD-10-CM | POA: Diagnosis not present

## 2018-07-12 DIAGNOSIS — I10 Essential (primary) hypertension: Secondary | ICD-10-CM | POA: Diagnosis not present

## 2018-07-12 DIAGNOSIS — Z436 Encounter for attention to other artificial openings of urinary tract: Secondary | ICD-10-CM | POA: Diagnosis not present

## 2018-07-12 DIAGNOSIS — D649 Anemia, unspecified: Secondary | ICD-10-CM | POA: Diagnosis not present

## 2018-07-12 DIAGNOSIS — Z960 Presence of urogenital implants: Secondary | ICD-10-CM | POA: Diagnosis not present

## 2018-07-12 DIAGNOSIS — Z9359 Other cystostomy status: Secondary | ICD-10-CM | POA: Diagnosis not present

## 2018-07-12 DIAGNOSIS — T83511A Infection and inflammatory reaction due to indwelling urethral catheter, initial encounter: Secondary | ICD-10-CM | POA: Diagnosis not present

## 2018-07-12 DIAGNOSIS — E876 Hypokalemia: Secondary | ICD-10-CM | POA: Diagnosis not present

## 2018-07-12 DIAGNOSIS — I96 Gangrene, not elsewhere classified: Secondary | ICD-10-CM | POA: Diagnosis not present

## 2018-07-12 DIAGNOSIS — L89229 Pressure ulcer of left hip, unspecified stage: Secondary | ICD-10-CM | POA: Diagnosis not present

## 2018-07-12 DIAGNOSIS — L89301 Pressure ulcer of unspecified buttock, stage 1: Secondary | ICD-10-CM | POA: Diagnosis not present

## 2018-07-12 DIAGNOSIS — Z981 Arthrodesis status: Secondary | ICD-10-CM | POA: Diagnosis not present

## 2018-07-12 DIAGNOSIS — N390 Urinary tract infection, site not specified: Secondary | ICD-10-CM | POA: Diagnosis not present

## 2018-07-12 DIAGNOSIS — R319 Hematuria, unspecified: Secondary | ICD-10-CM | POA: Diagnosis not present

## 2018-07-12 DIAGNOSIS — T83198A Other mechanical complication of other urinary devices and implants, initial encounter: Secondary | ICD-10-CM | POA: Diagnosis not present

## 2018-07-12 DIAGNOSIS — Z933 Colostomy status: Secondary | ICD-10-CM | POA: Diagnosis not present

## 2018-07-12 DIAGNOSIS — L8995 Pressure ulcer of unspecified site, unstageable: Secondary | ICD-10-CM | POA: Diagnosis not present

## 2018-07-20 ENCOUNTER — Encounter (HOSPITAL_BASED_OUTPATIENT_CLINIC_OR_DEPARTMENT_OTHER): Payer: Medicare Other | Attending: Internal Medicine

## 2018-07-20 DIAGNOSIS — L89323 Pressure ulcer of left buttock, stage 3: Secondary | ICD-10-CM | POA: Insufficient documentation

## 2018-07-20 DIAGNOSIS — S50312A Abrasion of left elbow, initial encounter: Secondary | ICD-10-CM | POA: Insufficient documentation

## 2018-07-20 DIAGNOSIS — L89154 Pressure ulcer of sacral region, stage 4: Secondary | ICD-10-CM | POA: Insufficient documentation

## 2018-07-20 DIAGNOSIS — L89214 Pressure ulcer of right hip, stage 4: Secondary | ICD-10-CM | POA: Insufficient documentation

## 2018-07-20 DIAGNOSIS — L89892 Pressure ulcer of other site, stage 2: Secondary | ICD-10-CM | POA: Insufficient documentation

## 2018-07-20 DIAGNOSIS — L89894 Pressure ulcer of other site, stage 4: Secondary | ICD-10-CM | POA: Insufficient documentation

## 2018-07-20 DIAGNOSIS — L89224 Pressure ulcer of left hip, stage 4: Secondary | ICD-10-CM | POA: Insufficient documentation

## 2018-07-20 DIAGNOSIS — S21101A Unspecified open wound of right front wall of thorax without penetration into thoracic cavity, initial encounter: Secondary | ICD-10-CM | POA: Insufficient documentation

## 2018-07-20 DIAGNOSIS — G8250 Quadriplegia, unspecified: Secondary | ICD-10-CM | POA: Insufficient documentation

## 2018-07-20 DIAGNOSIS — I10 Essential (primary) hypertension: Secondary | ICD-10-CM | POA: Insufficient documentation

## 2018-07-20 DIAGNOSIS — X58XXXA Exposure to other specified factors, initial encounter: Secondary | ICD-10-CM | POA: Insufficient documentation

## 2018-07-20 DIAGNOSIS — G4730 Sleep apnea, unspecified: Secondary | ICD-10-CM | POA: Insufficient documentation

## 2018-07-27 ENCOUNTER — Other Ambulatory Visit: Payer: Self-pay

## 2018-07-27 DIAGNOSIS — L89219 Pressure ulcer of right hip, unspecified stage: Secondary | ICD-10-CM | POA: Diagnosis not present

## 2018-07-27 DIAGNOSIS — L89224 Pressure ulcer of left hip, stage 4: Secondary | ICD-10-CM | POA: Diagnosis not present

## 2018-07-27 DIAGNOSIS — S21101A Unspecified open wound of right front wall of thorax without penetration into thoracic cavity, initial encounter: Secondary | ICD-10-CM | POA: Diagnosis not present

## 2018-07-27 DIAGNOSIS — S50312A Abrasion of left elbow, initial encounter: Secondary | ICD-10-CM | POA: Diagnosis not present

## 2018-07-27 DIAGNOSIS — X58XXXA Exposure to other specified factors, initial encounter: Secondary | ICD-10-CM | POA: Diagnosis not present

## 2018-07-27 DIAGNOSIS — L89899 Pressure ulcer of other site, unspecified stage: Secondary | ICD-10-CM | POA: Diagnosis not present

## 2018-07-27 DIAGNOSIS — G8250 Quadriplegia, unspecified: Secondary | ICD-10-CM | POA: Diagnosis not present

## 2018-07-27 DIAGNOSIS — L89323 Pressure ulcer of left buttock, stage 3: Secondary | ICD-10-CM | POA: Diagnosis not present

## 2018-07-27 DIAGNOSIS — L89229 Pressure ulcer of left hip, unspecified stage: Secondary | ICD-10-CM | POA: Diagnosis not present

## 2018-07-27 DIAGNOSIS — L89894 Pressure ulcer of other site, stage 4: Secondary | ICD-10-CM | POA: Diagnosis not present

## 2018-07-27 DIAGNOSIS — L89214 Pressure ulcer of right hip, stage 4: Secondary | ICD-10-CM | POA: Diagnosis not present

## 2018-07-27 DIAGNOSIS — L89154 Pressure ulcer of sacral region, stage 4: Secondary | ICD-10-CM | POA: Diagnosis not present

## 2018-07-27 DIAGNOSIS — L89892 Pressure ulcer of other site, stage 2: Secondary | ICD-10-CM | POA: Diagnosis not present

## 2018-07-27 DIAGNOSIS — L89329 Pressure ulcer of left buttock, unspecified stage: Secondary | ICD-10-CM | POA: Diagnosis not present

## 2018-07-27 DIAGNOSIS — I10 Essential (primary) hypertension: Secondary | ICD-10-CM | POA: Diagnosis not present

## 2018-07-27 DIAGNOSIS — G4730 Sleep apnea, unspecified: Secondary | ICD-10-CM | POA: Diagnosis not present

## 2018-08-03 DIAGNOSIS — L89309 Pressure ulcer of unspecified buttock, unspecified stage: Secondary | ICD-10-CM | POA: Diagnosis not present

## 2018-08-08 DIAGNOSIS — S31102A Unspecified open wound of abdominal wall, epigastric region without penetration into peritoneal cavity, initial encounter: Secondary | ICD-10-CM | POA: Diagnosis not present

## 2018-08-08 DIAGNOSIS — Z933 Colostomy status: Secondary | ICD-10-CM | POA: Diagnosis not present

## 2018-08-08 DIAGNOSIS — S31809A Unspecified open wound of unspecified buttock, initial encounter: Secondary | ICD-10-CM | POA: Diagnosis not present

## 2018-08-08 DIAGNOSIS — L8944 Pressure ulcer of contiguous site of back, buttock and hip, stage 4: Secondary | ICD-10-CM | POA: Diagnosis not present

## 2018-08-09 DIAGNOSIS — S31102A Unspecified open wound of abdominal wall, epigastric region without penetration into peritoneal cavity, initial encounter: Secondary | ICD-10-CM | POA: Diagnosis not present

## 2018-08-09 DIAGNOSIS — Z933 Colostomy status: Secondary | ICD-10-CM | POA: Diagnosis not present

## 2018-08-09 DIAGNOSIS — S31809A Unspecified open wound of unspecified buttock, initial encounter: Secondary | ICD-10-CM | POA: Diagnosis not present

## 2018-08-09 DIAGNOSIS — L8944 Pressure ulcer of contiguous site of back, buttock and hip, stage 4: Secondary | ICD-10-CM | POA: Diagnosis not present

## 2018-08-14 DIAGNOSIS — L89309 Pressure ulcer of unspecified buttock, unspecified stage: Secondary | ICD-10-CM | POA: Diagnosis not present

## 2018-08-15 DIAGNOSIS — L89309 Pressure ulcer of unspecified buttock, unspecified stage: Secondary | ICD-10-CM | POA: Diagnosis not present

## 2018-08-17 ENCOUNTER — Encounter (HOSPITAL_BASED_OUTPATIENT_CLINIC_OR_DEPARTMENT_OTHER): Payer: Medicare Other | Attending: Internal Medicine

## 2018-09-07 DIAGNOSIS — L89329 Pressure ulcer of left buttock, unspecified stage: Secondary | ICD-10-CM | POA: Diagnosis not present

## 2018-09-07 DIAGNOSIS — L89159 Pressure ulcer of sacral region, unspecified stage: Secondary | ICD-10-CM | POA: Diagnosis not present

## 2018-09-07 DIAGNOSIS — L89899 Pressure ulcer of other site, unspecified stage: Secondary | ICD-10-CM | POA: Diagnosis not present

## 2018-09-07 DIAGNOSIS — L89219 Pressure ulcer of right hip, unspecified stage: Secondary | ICD-10-CM | POA: Diagnosis not present

## 2018-09-10 DIAGNOSIS — L89309 Pressure ulcer of unspecified buttock, unspecified stage: Secondary | ICD-10-CM | POA: Diagnosis not present

## 2018-09-14 HISTORY — PX: GASTROJEJUNOSTOMY: SHX1697

## 2018-09-14 HISTORY — PX: APPENDECTOMY: SHX54

## 2018-09-15 DIAGNOSIS — T83511A Infection and inflammatory reaction due to indwelling urethral catheter, initial encounter: Secondary | ICD-10-CM | POA: Diagnosis not present

## 2018-09-15 DIAGNOSIS — B9629 Other Escherichia coli [E. coli] as the cause of diseases classified elsewhere: Secondary | ICD-10-CM | POA: Diagnosis not present

## 2018-09-15 DIAGNOSIS — K631 Perforation of intestine (nontraumatic): Secondary | ICD-10-CM | POA: Diagnosis not present

## 2018-09-15 DIAGNOSIS — N281 Cyst of kidney, acquired: Secondary | ICD-10-CM | POA: Diagnosis not present

## 2018-09-15 DIAGNOSIS — R0689 Other abnormalities of breathing: Secondary | ICD-10-CM | POA: Diagnosis not present

## 2018-09-15 DIAGNOSIS — N390 Urinary tract infection, site not specified: Secondary | ICD-10-CM | POA: Diagnosis not present

## 2018-09-15 DIAGNOSIS — R279 Unspecified lack of coordination: Secondary | ICD-10-CM | POA: Diagnosis not present

## 2018-09-15 DIAGNOSIS — I517 Cardiomegaly: Secondary | ICD-10-CM | POA: Diagnosis not present

## 2018-09-15 DIAGNOSIS — E46 Unspecified protein-calorie malnutrition: Secondary | ICD-10-CM | POA: Diagnosis not present

## 2018-09-15 DIAGNOSIS — A4189 Other specified sepsis: Secondary | ICD-10-CM | POA: Diagnosis not present

## 2018-09-15 DIAGNOSIS — K311 Adult hypertrophic pyloric stenosis: Secondary | ICD-10-CM | POA: Diagnosis not present

## 2018-09-15 DIAGNOSIS — R140 Abdominal distension (gaseous): Secondary | ICD-10-CM | POA: Diagnosis not present

## 2018-09-15 DIAGNOSIS — L89224 Pressure ulcer of left hip, stage 4: Secondary | ICD-10-CM | POA: Diagnosis not present

## 2018-09-15 DIAGNOSIS — Z4682 Encounter for fitting and adjustment of non-vascular catheter: Secondary | ICD-10-CM | POA: Diagnosis not present

## 2018-09-15 DIAGNOSIS — R509 Fever, unspecified: Secondary | ICD-10-CM | POA: Diagnosis not present

## 2018-09-15 DIAGNOSIS — K660 Peritoneal adhesions (postprocedural) (postinfection): Secondary | ICD-10-CM | POA: Diagnosis not present

## 2018-09-15 DIAGNOSIS — Z2239 Carrier of other specified bacterial diseases: Secondary | ICD-10-CM | POA: Diagnosis not present

## 2018-09-15 DIAGNOSIS — B9620 Unspecified Escherichia coli [E. coli] as the cause of diseases classified elsewhere: Secondary | ICD-10-CM | POA: Diagnosis not present

## 2018-09-15 DIAGNOSIS — Z48815 Encounter for surgical aftercare following surgery on the digestive system: Secondary | ICD-10-CM | POA: Diagnosis not present

## 2018-09-15 DIAGNOSIS — L89159 Pressure ulcer of sacral region, unspecified stage: Secondary | ICD-10-CM | POA: Diagnosis not present

## 2018-09-15 DIAGNOSIS — I498 Other specified cardiac arrhythmias: Secondary | ICD-10-CM | POA: Diagnosis not present

## 2018-09-15 DIAGNOSIS — D509 Iron deficiency anemia, unspecified: Secondary | ICD-10-CM | POA: Diagnosis not present

## 2018-09-15 DIAGNOSIS — R112 Nausea with vomiting, unspecified: Secondary | ICD-10-CM | POA: Diagnosis not present

## 2018-09-15 DIAGNOSIS — K3184 Gastroparesis: Secondary | ICD-10-CM | POA: Diagnosis not present

## 2018-09-15 DIAGNOSIS — A419 Sepsis, unspecified organism: Secondary | ICD-10-CM | POA: Diagnosis not present

## 2018-09-15 DIAGNOSIS — K389 Disease of appendix, unspecified: Secondary | ICD-10-CM | POA: Diagnosis not present

## 2018-09-15 DIAGNOSIS — Z515 Encounter for palliative care: Secondary | ICD-10-CM | POA: Diagnosis not present

## 2018-09-15 DIAGNOSIS — R197 Diarrhea, unspecified: Secondary | ICD-10-CM | POA: Diagnosis not present

## 2018-09-15 DIAGNOSIS — K5641 Fecal impaction: Secondary | ICD-10-CM | POA: Diagnosis not present

## 2018-09-15 DIAGNOSIS — G8250 Quadriplegia, unspecified: Secondary | ICD-10-CM | POA: Diagnosis not present

## 2018-09-15 DIAGNOSIS — S14109S Unspecified injury at unspecified level of cervical spinal cord, sequela: Secondary | ICD-10-CM | POA: Diagnosis not present

## 2018-09-15 DIAGNOSIS — E876 Hypokalemia: Secondary | ICD-10-CM | POA: Diagnosis not present

## 2018-09-15 DIAGNOSIS — T83510A Infection and inflammatory reaction due to cystostomy catheter, initial encounter: Secondary | ICD-10-CM | POA: Diagnosis not present

## 2018-09-15 DIAGNOSIS — K567 Ileus, unspecified: Secondary | ICD-10-CM | POA: Diagnosis not present

## 2018-09-15 DIAGNOSIS — L89154 Pressure ulcer of sacral region, stage 4: Secondary | ICD-10-CM | POA: Diagnosis not present

## 2018-09-15 DIAGNOSIS — Z1612 Extended spectrum beta lactamase (ESBL) resistance: Secondary | ICD-10-CM | POA: Diagnosis not present

## 2018-09-15 DIAGNOSIS — R8271 Bacteriuria: Secondary | ICD-10-CM | POA: Diagnosis not present

## 2018-09-15 DIAGNOSIS — R0602 Shortness of breath: Secondary | ICD-10-CM | POA: Diagnosis not present

## 2018-09-15 DIAGNOSIS — K3189 Other diseases of stomach and duodenum: Secondary | ICD-10-CM | POA: Diagnosis not present

## 2018-09-15 DIAGNOSIS — J9811 Atelectasis: Secondary | ICD-10-CM | POA: Diagnosis not present

## 2018-09-15 DIAGNOSIS — M4624 Osteomyelitis of vertebra, thoracic region: Secondary | ICD-10-CM | POA: Diagnosis not present

## 2018-09-15 DIAGNOSIS — N319 Neuromuscular dysfunction of bladder, unspecified: Secondary | ICD-10-CM | POA: Diagnosis not present

## 2018-09-15 DIAGNOSIS — Z933 Colostomy status: Secondary | ICD-10-CM | POA: Diagnosis not present

## 2018-09-15 DIAGNOSIS — N3000 Acute cystitis without hematuria: Secondary | ICD-10-CM | POA: Diagnosis not present

## 2018-09-15 DIAGNOSIS — E611 Iron deficiency: Secondary | ICD-10-CM | POA: Diagnosis not present

## 2018-09-15 DIAGNOSIS — Z743 Need for continuous supervision: Secondary | ICD-10-CM | POA: Diagnosis not present

## 2018-09-15 DIAGNOSIS — L89893 Pressure ulcer of other site, stage 3: Secondary | ICD-10-CM | POA: Diagnosis not present

## 2018-09-15 DIAGNOSIS — I959 Hypotension, unspecified: Secondary | ICD-10-CM | POA: Diagnosis not present

## 2018-09-15 DIAGNOSIS — Z0389 Encounter for observation for other suspected diseases and conditions ruled out: Secondary | ICD-10-CM | POA: Diagnosis not present

## 2018-09-16 IMAGING — CR DG ABDOMEN ACUTE W/ 1V CHEST
5 series · 5 of 5 positions shown · non-contrast
Comparison: Chest radiograph November 24, 2015

CLINICAL DATA: Nausea and vomiting for 2 days. History of
quadriplegia, gastritis, gastric ulcers, reflux disease.

EXAM:
DG ABDOMEN ACUTE W/ 1V CHEST

[x chest ap]
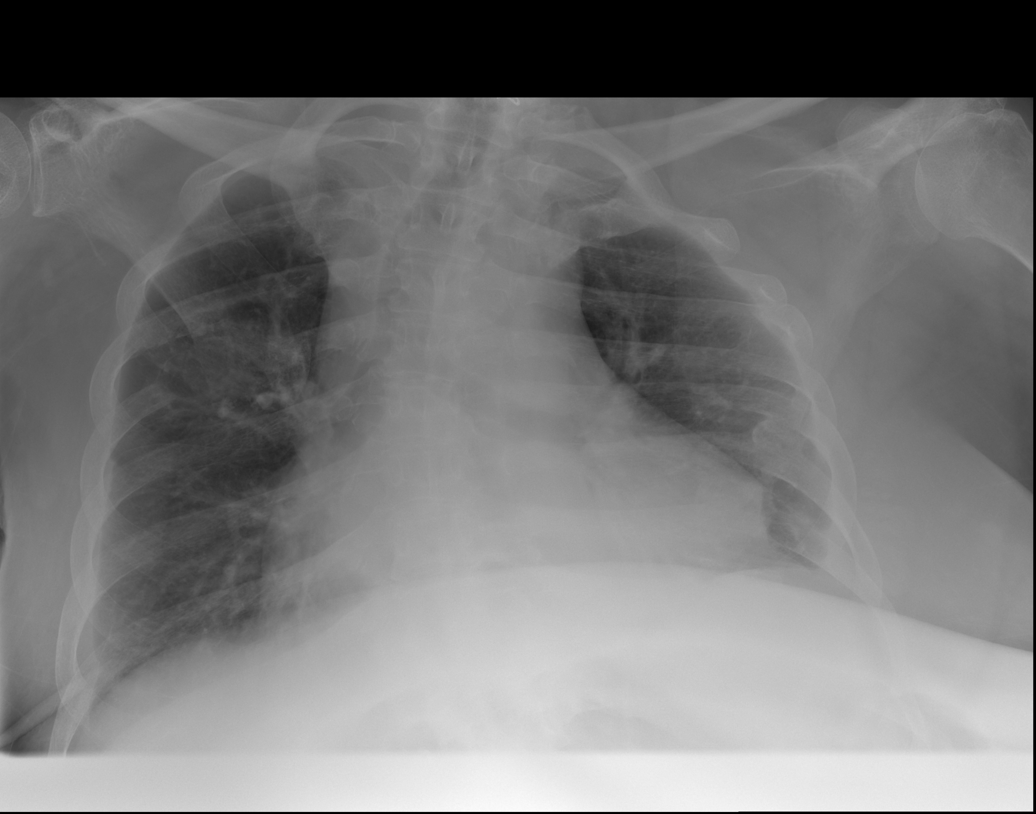

[t abdomen supine (1 of 3)]
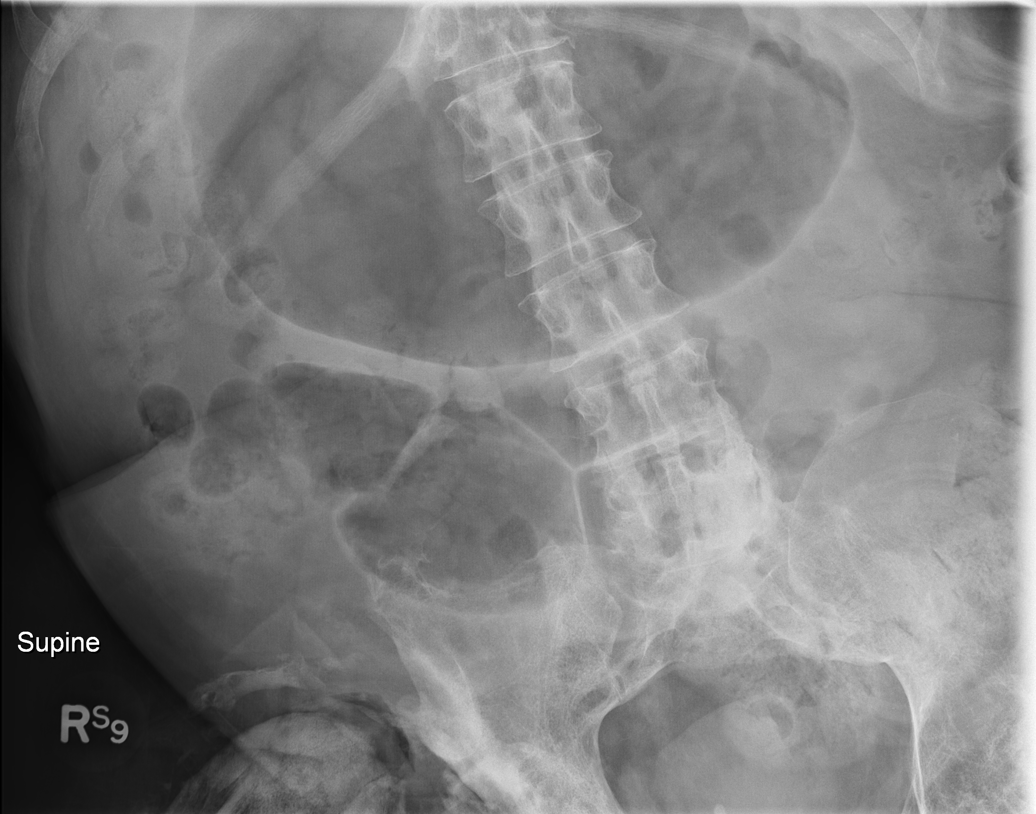

[t abdomen supine (2 of 3)]
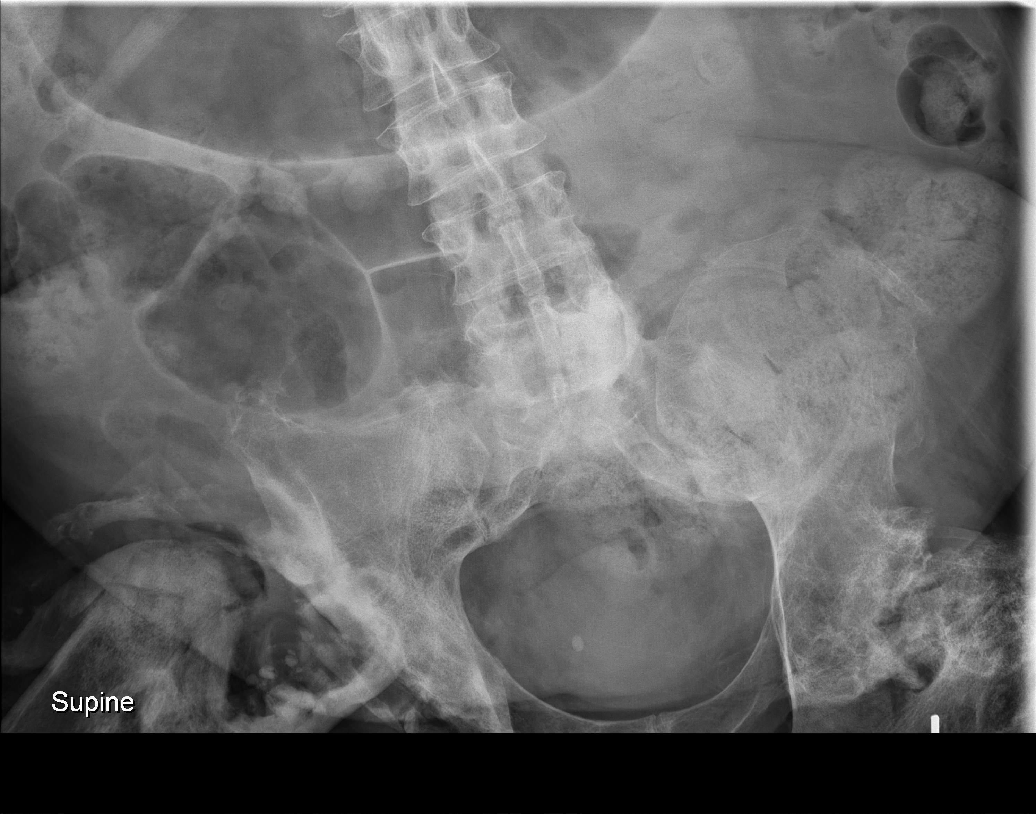

[t abdomen supine (3 of 3)]
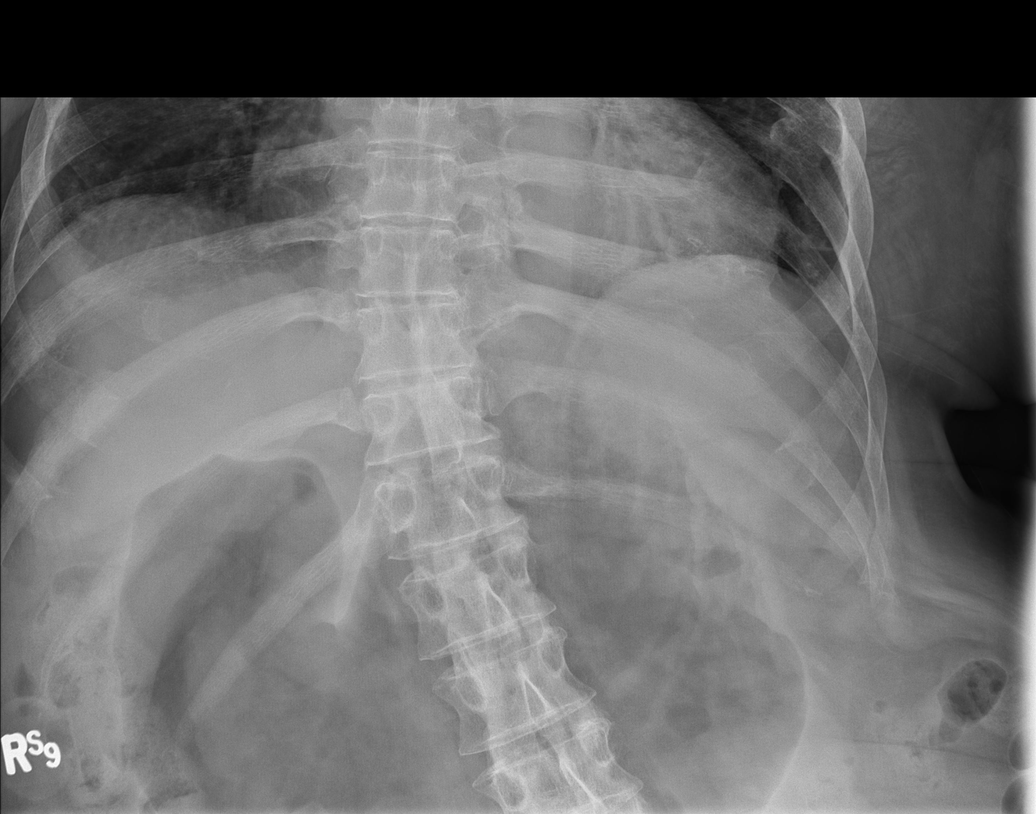

[w abdomen decub]
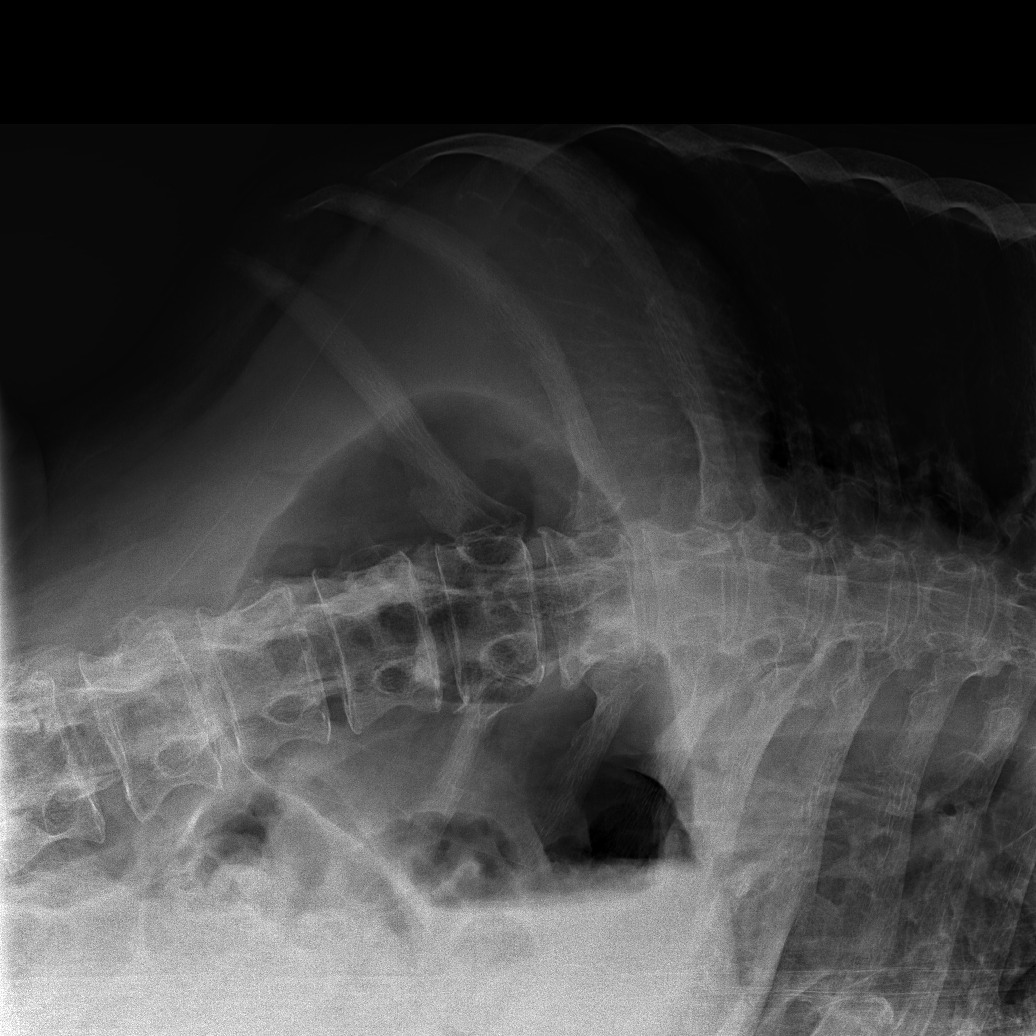

[5 of 5 positions shown; findings below may reference images not displayed]

FINDINGS: Cardiac silhouette is mildly enlarged and unchanged. Fullness of the
mediastinum is unchanged, in part attributed to AP technique.
Improved aeration of the lungs. Residual blunting of costophrenic
angles. Bibasilar strandy densities. No pneumothorax. Lower cervical
cerclage wire worse. Scoliosis and old predominately LEFT-sided
fractures.

Gas distended stomach. A few loops of mildly gaseous distended
colon. Mild amount of retained large bowel stool. LEFT lower
quadrant ostomy. No intraperitoneal free air.Severe chronic
deformities of the hips.
IMPRESSION: Bibasilar pleural thickening versus small pleural effusions and
bibasilar atelectasis. Stable cardiomegaly.

Gaseous distended stomach. Mild amount of retained large bowel
stool.

## 2018-09-16 IMAGING — DX DG ABDOMEN 1V
2 series · 2 of 2 positions shown · non-contrast
Comparison: Abdominal radiograph February 02, 2016 at 4149 hours

CLINICAL DATA: Nasogastric tube insertion. Abdominal distention,
nausea and vomiting.

EXAM:
ABDOMEN - 1 VIEW

[abdomen kub (1 of 2)]
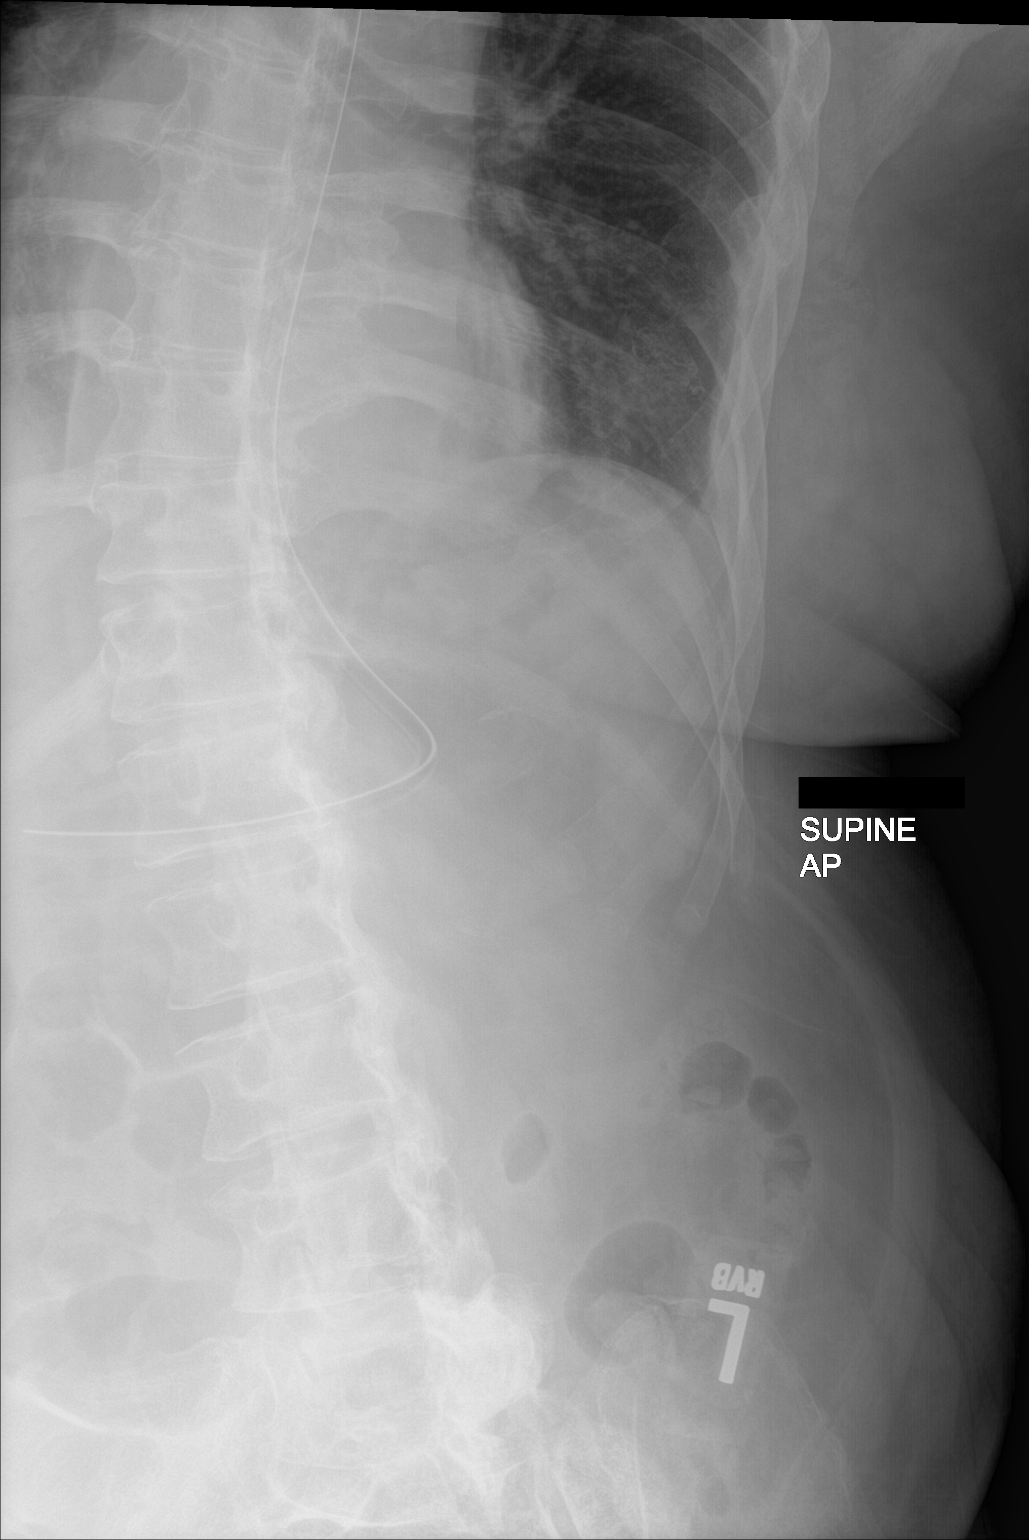

[abdomen kub (2 of 2)]
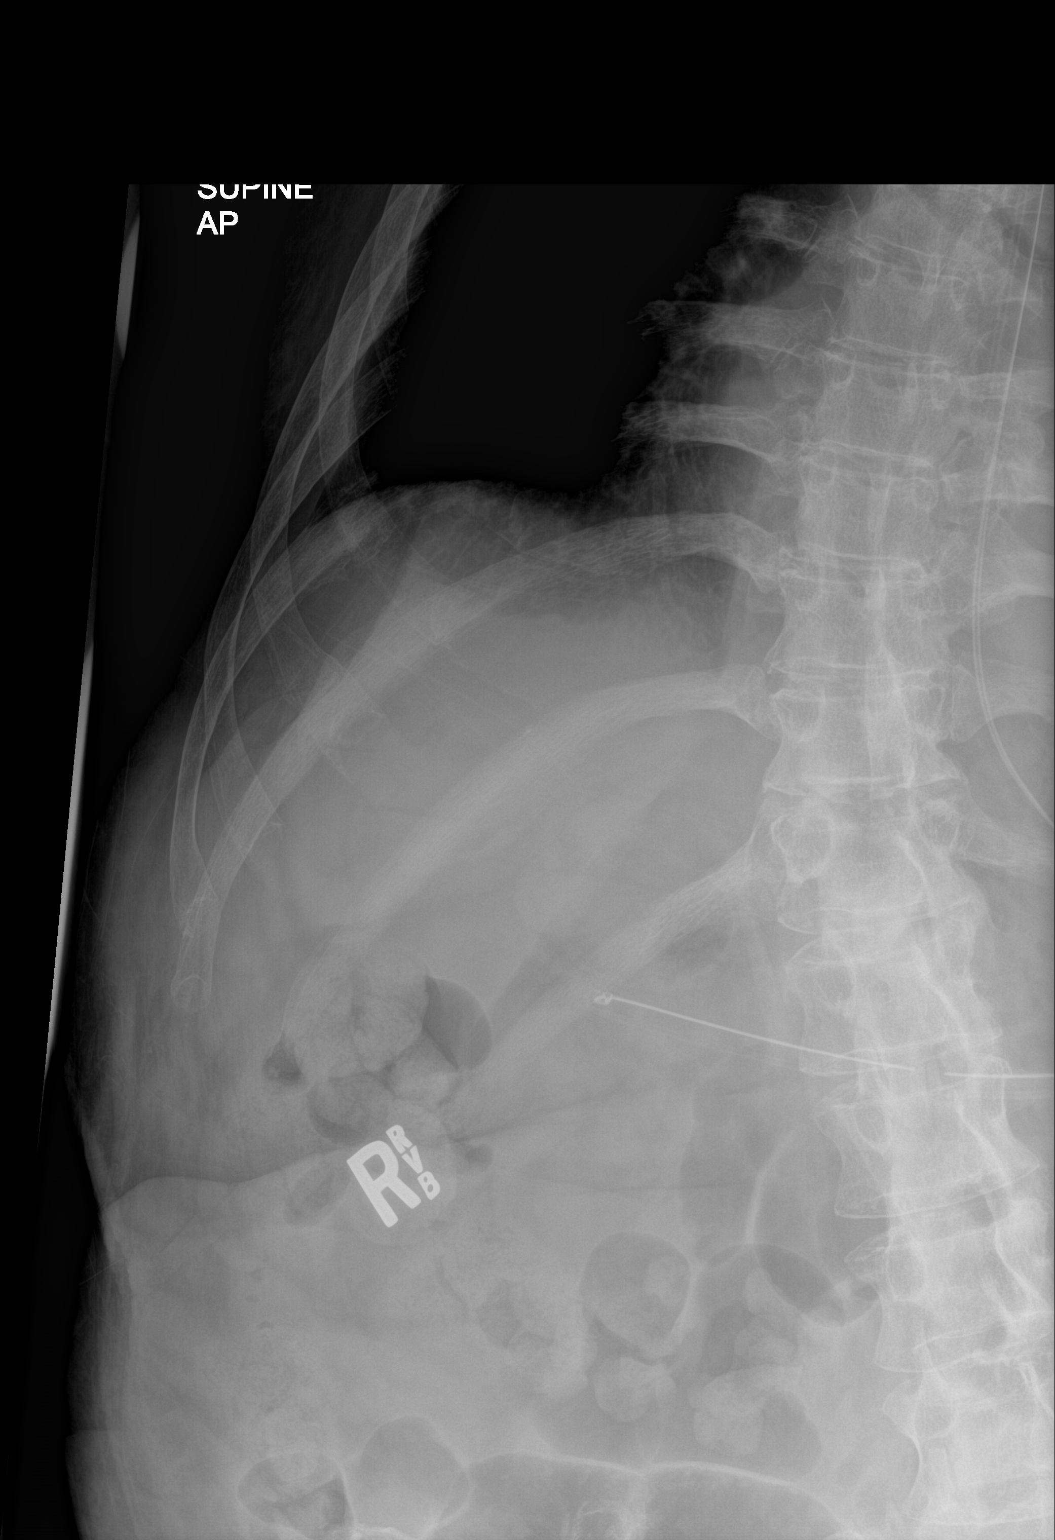

[2 of 2 positions shown; findings below may reference images not displayed]

FINDINGS: Nasogastric tube distal tip projects in distal stomach. Decompressed
stomach. Moderate amount of retained large bowel stool.
IMPRESSION: Nasogastric tube distal tip projects in distal stomach, decompressed
stomach.

Moderate amount of retained large bowel stool.

## 2018-09-21 DIAGNOSIS — K311 Adult hypertrophic pyloric stenosis: Secondary | ICD-10-CM | POA: Diagnosis present

## 2018-10-11 DIAGNOSIS — R279 Unspecified lack of coordination: Secondary | ICD-10-CM | POA: Diagnosis not present

## 2018-10-11 DIAGNOSIS — R509 Fever, unspecified: Secondary | ICD-10-CM | POA: Diagnosis not present

## 2018-10-11 DIAGNOSIS — Z743 Need for continuous supervision: Secondary | ICD-10-CM | POA: Diagnosis not present

## 2018-10-13 ENCOUNTER — Encounter (HOSPITAL_COMMUNITY): Payer: Self-pay | Admitting: Emergency Medicine

## 2018-10-13 ENCOUNTER — Inpatient Hospital Stay (HOSPITAL_COMMUNITY)
Admission: EM | Admit: 2018-10-13 | Discharge: 2018-10-20 | DRG: 871 | Disposition: A | Discharge status: Home Health | Payer: Medicare Other | Attending: Internal Medicine | Admitting: Internal Medicine

## 2018-10-13 DIAGNOSIS — F329 Major depressive disorder, single episode, unspecified: Secondary | ICD-10-CM | POA: Diagnosis present

## 2018-10-13 DIAGNOSIS — M2550 Pain in unspecified joint: Secondary | ICD-10-CM | POA: Diagnosis not present

## 2018-10-13 DIAGNOSIS — R0602 Shortness of breath: Secondary | ICD-10-CM | POA: Diagnosis not present

## 2018-10-13 DIAGNOSIS — D638 Anemia in other chronic diseases classified elsewhere: Secondary | ICD-10-CM | POA: Diagnosis present

## 2018-10-13 DIAGNOSIS — J9600 Acute respiratory failure, unspecified whether with hypoxia or hypercapnia: Secondary | ICD-10-CM | POA: Diagnosis not present

## 2018-10-13 DIAGNOSIS — Z882 Allergy status to sulfonamides status: Secondary | ICD-10-CM

## 2018-10-13 DIAGNOSIS — Z6825 Body mass index (BMI) 25.0-25.9, adult: Secondary | ICD-10-CM

## 2018-10-13 DIAGNOSIS — R112 Nausea with vomiting, unspecified: Secondary | ICD-10-CM | POA: Diagnosis not present

## 2018-10-13 DIAGNOSIS — Z8711 Personal history of peptic ulcer disease: Secondary | ICD-10-CM

## 2018-10-13 DIAGNOSIS — G8250 Quadriplegia, unspecified: Secondary | ICD-10-CM | POA: Diagnosis present

## 2018-10-13 DIAGNOSIS — R42 Dizziness and giddiness: Secondary | ICD-10-CM | POA: Diagnosis not present

## 2018-10-13 DIAGNOSIS — R109 Unspecified abdominal pain: Secondary | ICD-10-CM | POA: Diagnosis not present

## 2018-10-13 DIAGNOSIS — J9601 Acute respiratory failure with hypoxia: Secondary | ICD-10-CM

## 2018-10-13 DIAGNOSIS — J9621 Acute and chronic respiratory failure with hypoxia: Secondary | ICD-10-CM | POA: Diagnosis present

## 2018-10-13 DIAGNOSIS — N179 Acute kidney failure, unspecified: Secondary | ICD-10-CM

## 2018-10-13 DIAGNOSIS — Z960 Presence of urogenital implants: Secondary | ICD-10-CM | POA: Diagnosis present

## 2018-10-13 DIAGNOSIS — K9189 Other postprocedural complications and disorders of digestive system: Secondary | ICD-10-CM | POA: Diagnosis present

## 2018-10-13 DIAGNOSIS — Z7401 Bed confinement status: Secondary | ICD-10-CM | POA: Diagnosis not present

## 2018-10-13 DIAGNOSIS — N189 Chronic kidney disease, unspecified: Secondary | ICD-10-CM | POA: Diagnosis present

## 2018-10-13 DIAGNOSIS — R5381 Other malaise: Secondary | ICD-10-CM | POA: Diagnosis not present

## 2018-10-13 DIAGNOSIS — K56609 Unspecified intestinal obstruction, unspecified as to partial versus complete obstruction: Secondary | ICD-10-CM | POA: Diagnosis not present

## 2018-10-13 DIAGNOSIS — K567 Ileus, unspecified: Secondary | ICD-10-CM | POA: Diagnosis not present

## 2018-10-13 DIAGNOSIS — E872 Acidosis: Secondary | ICD-10-CM | POA: Diagnosis present

## 2018-10-13 DIAGNOSIS — Z8701 Personal history of pneumonia (recurrent): Secondary | ICD-10-CM

## 2018-10-13 DIAGNOSIS — K219 Gastro-esophageal reflux disease without esophagitis: Secondary | ICD-10-CM | POA: Diagnosis present

## 2018-10-13 DIAGNOSIS — I129 Hypertensive chronic kidney disease with stage 1 through stage 4 chronic kidney disease, or unspecified chronic kidney disease: Secondary | ICD-10-CM | POA: Diagnosis present

## 2018-10-13 DIAGNOSIS — E162 Hypoglycemia, unspecified: Secondary | ICD-10-CM | POA: Diagnosis not present

## 2018-10-13 DIAGNOSIS — M4628 Osteomyelitis of vertebra, sacral and sacrococcygeal region: Secondary | ICD-10-CM | POA: Diagnosis not present

## 2018-10-13 DIAGNOSIS — R0902 Hypoxemia: Secondary | ICD-10-CM | POA: Diagnosis not present

## 2018-10-13 DIAGNOSIS — N319 Neuromuscular dysfunction of bladder, unspecified: Secondary | ICD-10-CM | POA: Diagnosis present

## 2018-10-13 DIAGNOSIS — Z4659 Encounter for fitting and adjustment of other gastrointestinal appliance and device: Secondary | ICD-10-CM

## 2018-10-13 DIAGNOSIS — E876 Hypokalemia: Secondary | ICD-10-CM | POA: Diagnosis present

## 2018-10-13 DIAGNOSIS — E662 Morbid (severe) obesity with alveolar hypoventilation: Secondary | ICD-10-CM | POA: Diagnosis present

## 2018-10-13 DIAGNOSIS — R6520 Severe sepsis without septic shock: Secondary | ICD-10-CM | POA: Diagnosis not present

## 2018-10-13 DIAGNOSIS — A419 Sepsis, unspecified organism: Secondary | ICD-10-CM | POA: Diagnosis present

## 2018-10-13 DIAGNOSIS — D509 Iron deficiency anemia, unspecified: Secondary | ICD-10-CM | POA: Diagnosis present

## 2018-10-13 DIAGNOSIS — Z20828 Contact with and (suspected) exposure to other viral communicable diseases: Secondary | ICD-10-CM | POA: Diagnosis present

## 2018-10-13 DIAGNOSIS — K3184 Gastroparesis: Secondary | ICD-10-CM | POA: Diagnosis present

## 2018-10-13 DIAGNOSIS — J90 Pleural effusion, not elsewhere classified: Secondary | ICD-10-CM | POA: Diagnosis not present

## 2018-10-13 DIAGNOSIS — J181 Lobar pneumonia, unspecified organism: Secondary | ICD-10-CM | POA: Diagnosis not present

## 2018-10-13 DIAGNOSIS — E860 Dehydration: Secondary | ICD-10-CM | POA: Diagnosis present

## 2018-10-13 DIAGNOSIS — Z87440 Personal history of urinary (tract) infections: Secondary | ICD-10-CM

## 2018-10-13 DIAGNOSIS — Z803 Family history of malignant neoplasm of breast: Secondary | ICD-10-CM

## 2018-10-13 DIAGNOSIS — Z9049 Acquired absence of other specified parts of digestive tract: Secondary | ICD-10-CM

## 2018-10-13 DIAGNOSIS — Z4682 Encounter for fitting and adjustment of non-vascular catheter: Secondary | ICD-10-CM | POA: Diagnosis not present

## 2018-10-13 DIAGNOSIS — L89154 Pressure ulcer of sacral region, stage 4: Secondary | ICD-10-CM | POA: Diagnosis not present

## 2018-10-13 DIAGNOSIS — Z933 Colostomy status: Secondary | ICD-10-CM

## 2018-10-13 DIAGNOSIS — R6521 Severe sepsis with septic shock: Secondary | ICD-10-CM | POA: Diagnosis not present

## 2018-10-13 DIAGNOSIS — R000 Tachycardia, unspecified: Secondary | ICD-10-CM | POA: Diagnosis not present

## 2018-10-13 DIAGNOSIS — Z833 Family history of diabetes mellitus: Secondary | ICD-10-CM

## 2018-10-13 DIAGNOSIS — Z79899 Other long term (current) drug therapy: Secondary | ICD-10-CM

## 2018-10-13 DIAGNOSIS — K56699 Other intestinal obstruction unspecified as to partial versus complete obstruction: Secondary | ICD-10-CM | POA: Diagnosis not present

## 2018-10-13 DIAGNOSIS — J189 Pneumonia, unspecified organism: Secondary | ICD-10-CM

## 2018-10-13 DIAGNOSIS — J690 Pneumonitis due to inhalation of food and vomit: Secondary | ICD-10-CM | POA: Diagnosis not present

## 2018-10-13 DIAGNOSIS — R140 Abdominal distension (gaseous): Secondary | ICD-10-CM | POA: Diagnosis not present

## 2018-10-13 DIAGNOSIS — Z0189 Encounter for other specified special examinations: Secondary | ICD-10-CM

## 2018-10-13 DIAGNOSIS — R1110 Vomiting, unspecified: Secondary | ICD-10-CM | POA: Diagnosis not present

## 2018-10-13 DIAGNOSIS — J9811 Atelectasis: Secondary | ICD-10-CM | POA: Diagnosis not present

## 2018-10-13 MED ORDER — SODIUM CHLORIDE 0.9% FLUSH
3.0000 mL | Freq: Once | INTRAVENOUS | Status: AC
  Administered 2018-10-14: 3 mL via INTRAVENOUS

## 2018-10-13 MED ORDER — ONDANSETRON HCL 4 MG/2ML IJ SOLN
4.0000 mg | Freq: Once | INTRAMUSCULAR | Status: DC | PRN
  Filled 2018-10-13: qty 2

## 2018-10-13 NOTE — ED Notes (Addendum)
Bed: MO06 Expected date:  Expected time:  Means of arrival:  Comments: EMS 53 yo male nausea and vomiting/gastroparesis-just discharged from hospital yesterday-quadriplegic

## 2018-10-13 NOTE — ED Triage Notes (Addendum)
Patient is complaining of vomiting because he has been vomiting since he got released from the hospital yesterday. Patient has hx of gastroparesis. Patient is a Investment banker, corporate.

## 2018-10-14 ENCOUNTER — Inpatient Hospital Stay (HOSPITAL_COMMUNITY): Payer: Medicare Other

## 2018-10-14 ENCOUNTER — Emergency Department (HOSPITAL_COMMUNITY): Payer: Medicare Other

## 2018-10-14 ENCOUNTER — Other Ambulatory Visit: Payer: Self-pay

## 2018-10-14 DIAGNOSIS — E162 Hypoglycemia, unspecified: Secondary | ICD-10-CM | POA: Diagnosis present

## 2018-10-14 DIAGNOSIS — R6520 Severe sepsis without septic shock: Secondary | ICD-10-CM

## 2018-10-14 DIAGNOSIS — J9621 Acute and chronic respiratory failure with hypoxia: Secondary | ICD-10-CM | POA: Diagnosis present

## 2018-10-14 DIAGNOSIS — Z7401 Bed confinement status: Secondary | ICD-10-CM | POA: Diagnosis not present

## 2018-10-14 DIAGNOSIS — J690 Pneumonitis due to inhalation of food and vomit: Secondary | ICD-10-CM | POA: Diagnosis present

## 2018-10-14 DIAGNOSIS — J181 Lobar pneumonia, unspecified organism: Secondary | ICD-10-CM | POA: Diagnosis not present

## 2018-10-14 DIAGNOSIS — A419 Sepsis, unspecified organism: Principal | ICD-10-CM

## 2018-10-14 DIAGNOSIS — I129 Hypertensive chronic kidney disease with stage 1 through stage 4 chronic kidney disease, or unspecified chronic kidney disease: Secondary | ICD-10-CM | POA: Diagnosis present

## 2018-10-14 DIAGNOSIS — E860 Dehydration: Secondary | ICD-10-CM | POA: Diagnosis present

## 2018-10-14 DIAGNOSIS — L89154 Pressure ulcer of sacral region, stage 4: Secondary | ICD-10-CM | POA: Diagnosis present

## 2018-10-14 DIAGNOSIS — K9189 Other postprocedural complications and disorders of digestive system: Secondary | ICD-10-CM | POA: Diagnosis present

## 2018-10-14 DIAGNOSIS — N319 Neuromuscular dysfunction of bladder, unspecified: Secondary | ICD-10-CM | POA: Diagnosis present

## 2018-10-14 DIAGNOSIS — G8250 Quadriplegia, unspecified: Secondary | ICD-10-CM | POA: Diagnosis present

## 2018-10-14 DIAGNOSIS — N179 Acute kidney failure, unspecified: Secondary | ICD-10-CM

## 2018-10-14 DIAGNOSIS — K3184 Gastroparesis: Secondary | ICD-10-CM | POA: Diagnosis present

## 2018-10-14 DIAGNOSIS — K567 Ileus, unspecified: Secondary | ICD-10-CM | POA: Diagnosis present

## 2018-10-14 DIAGNOSIS — M2550 Pain in unspecified joint: Secondary | ICD-10-CM | POA: Diagnosis not present

## 2018-10-14 DIAGNOSIS — R5381 Other malaise: Secondary | ICD-10-CM | POA: Diagnosis not present

## 2018-10-14 DIAGNOSIS — M4628 Osteomyelitis of vertebra, sacral and sacrococcygeal region: Secondary | ICD-10-CM | POA: Diagnosis present

## 2018-10-14 DIAGNOSIS — E872 Acidosis: Secondary | ICD-10-CM | POA: Diagnosis present

## 2018-10-14 DIAGNOSIS — E876 Hypokalemia: Secondary | ICD-10-CM | POA: Diagnosis present

## 2018-10-14 DIAGNOSIS — N189 Chronic kidney disease, unspecified: Secondary | ICD-10-CM | POA: Diagnosis present

## 2018-10-14 DIAGNOSIS — F329 Major depressive disorder, single episode, unspecified: Secondary | ICD-10-CM | POA: Diagnosis present

## 2018-10-14 DIAGNOSIS — Z20828 Contact with and (suspected) exposure to other viral communicable diseases: Secondary | ICD-10-CM | POA: Diagnosis present

## 2018-10-14 DIAGNOSIS — R6521 Severe sepsis with septic shock: Secondary | ICD-10-CM | POA: Diagnosis present

## 2018-10-14 DIAGNOSIS — K219 Gastro-esophageal reflux disease without esophagitis: Secondary | ICD-10-CM | POA: Diagnosis present

## 2018-10-14 DIAGNOSIS — E662 Morbid (severe) obesity with alveolar hypoventilation: Secondary | ICD-10-CM | POA: Diagnosis present

## 2018-10-14 DIAGNOSIS — K56609 Unspecified intestinal obstruction, unspecified as to partial versus complete obstruction: Secondary | ICD-10-CM | POA: Diagnosis present

## 2018-10-14 LAB — CBC
HCT: 36.1 % — ABNORMAL LOW (ref 39.0–52.0)
Hemoglobin: 10.6 g/dL — ABNORMAL LOW (ref 13.0–17.0)
MCH: 23.8 pg — ABNORMAL LOW (ref 26.0–34.0)
MCHC: 29.4 g/dL — ABNORMAL LOW (ref 30.0–36.0)
MCV: 80.9 fL (ref 80.0–100.0)
Platelets: 456 10*3/uL — ABNORMAL HIGH (ref 150–400)
RBC: 4.46 MIL/uL (ref 4.22–5.81)
RDW: 18.5 % — ABNORMAL HIGH (ref 11.5–15.5)
WBC: 13.9 10*3/uL — ABNORMAL HIGH (ref 4.0–10.5)
nRBC: 0 % (ref 0.0–0.2)

## 2018-10-14 LAB — URINALYSIS, ROUTINE W REFLEX MICROSCOPIC
Bilirubin Urine: NEGATIVE
Glucose, UA: NEGATIVE mg/dL
Ketones, ur: NEGATIVE mg/dL
Nitrite: NEGATIVE
Protein, ur: NEGATIVE mg/dL
Specific Gravity, Urine: 1.013 (ref 1.005–1.030)
pH: 5 (ref 5.0–8.0)

## 2018-10-14 LAB — SARS CORONAVIRUS 2 BY RT PCR (HOSPITAL ORDER, PERFORMED IN ~~LOC~~ HOSPITAL LAB): SARS Coronavirus 2: NEGATIVE

## 2018-10-14 LAB — COMPREHENSIVE METABOLIC PANEL
ALT: 23 U/L (ref 0–44)
AST: 21 U/L (ref 15–41)
Albumin: 2.9 g/dL — ABNORMAL LOW (ref 3.5–5.0)
Alkaline Phosphatase: 70 U/L (ref 38–126)
Anion gap: 15 (ref 5–15)
BUN: 60 mg/dL — ABNORMAL HIGH (ref 6–20)
CO2: 17 mmol/L — ABNORMAL LOW (ref 22–32)
Calcium: 8.7 mg/dL — ABNORMAL LOW (ref 8.9–10.3)
Chloride: 101 mmol/L (ref 98–111)
Creatinine, Ser: 1.91 mg/dL — ABNORMAL HIGH (ref 0.61–1.24)
GFR calc Af Amer: 46 mL/min — ABNORMAL LOW (ref >60)
GFR calc non Af Amer: 39 mL/min — ABNORMAL LOW (ref >60)
Glucose, Bld: 112 mg/dL — ABNORMAL HIGH (ref 70–99)
Potassium: 4.4 mmol/L (ref 3.5–5.1)
Sodium: 133 mmol/L — ABNORMAL LOW (ref 135–145)
Total Bilirubin: 0.6 mg/dL (ref 0.3–1.2)
Total Protein: 7 g/dL (ref 6.5–8.1)

## 2018-10-14 LAB — LIPASE, BLOOD: Lipase: 36 U/L (ref 11–51)

## 2018-10-14 LAB — LACTIC ACID, PLASMA: Lactic Acid, Venous: 1 mmol/L (ref 0.5–1.9)

## 2018-10-14 MED ORDER — CHLORHEXIDINE GLUCONATE CLOTH 2 % EX PADS
6.0000 | MEDICATED_PAD | Freq: Every day | CUTANEOUS | Status: DC
  Administered 2018-10-14 – 2018-10-16 (×3): 6 via TOPICAL

## 2018-10-14 MED ORDER — METRONIDAZOLE IN NACL 5-0.79 MG/ML-% IV SOLN
500.0000 mg | Freq: Once | INTRAVENOUS | Status: AC
  Administered 2018-10-14: 500 mg via INTRAVENOUS
  Filled 2018-10-14: qty 100

## 2018-10-14 MED ORDER — PIPERACILLIN-TAZOBACTAM 3.375 G IVPB
3.3750 g | Freq: Three times a day (TID) | INTRAVENOUS | Status: DC
  Administered 2018-10-14 – 2018-10-20 (×18): 3.375 g via INTRAVENOUS
  Filled 2018-10-14 (×18): qty 50

## 2018-10-14 MED ORDER — METOCLOPRAMIDE HCL 5 MG/ML IJ SOLN
10.0000 mg | Freq: Once | INTRAMUSCULAR | Status: AC
  Administered 2018-10-14: 10 mg via INTRAVENOUS
  Filled 2018-10-14: qty 2

## 2018-10-14 MED ORDER — SODIUM CHLORIDE 0.9 % IV BOLUS (SEPSIS)
1000.0000 mL | Freq: Once | INTRAVENOUS | Status: AC
  Administered 2018-10-14: 1000 mL via INTRAVENOUS

## 2018-10-14 MED ORDER — LACTATED RINGERS IV BOLUS
1000.0000 mL | Freq: Once | INTRAVENOUS | Status: AC
  Administered 2018-10-14: 1000 mL via INTRAVENOUS

## 2018-10-14 MED ORDER — SODIUM CHLORIDE 0.9 % IV SOLN
2.0000 g | Freq: Once | INTRAVENOUS | Status: AC
  Administered 2018-10-14: 2 g via INTRAVENOUS
  Filled 2018-10-14: qty 2

## 2018-10-14 MED ORDER — PIPERACILLIN-TAZOBACTAM 3.375 G IVPB 30 MIN
3.3750 g | Freq: Once | INTRAVENOUS | Status: AC
  Administered 2018-10-14: 3.375 g via INTRAVENOUS
  Filled 2018-10-14: qty 50

## 2018-10-14 MED ORDER — PANTOPRAZOLE SODIUM 40 MG IV SOLR
40.0000 mg | INTRAVENOUS | Status: DC
  Administered 2018-10-14 – 2018-10-17 (×4): 40 mg via INTRAVENOUS
  Filled 2018-10-14 (×4): qty 40

## 2018-10-14 MED ORDER — ORAL CARE MOUTH RINSE
15.0000 mL | Freq: Two times a day (BID) | OROMUCOSAL | Status: DC
  Administered 2018-10-14 – 2018-10-15 (×3): 15 mL via OROMUCOSAL

## 2018-10-14 MED ORDER — LACTATED RINGERS IV SOLN
INTRAVENOUS | Status: DC
  Administered 2018-10-14 – 2018-10-15 (×4): via INTRAVENOUS

## 2018-10-14 MED ORDER — NOREPINEPHRINE BITARTRATE 1 MG/ML IV SOLN
0.0000 ug/min | INTRAVENOUS | Status: DC
  Administered 2018-10-14: 2 ug/min via INTRAVENOUS
  Administered 2018-10-15: 4 ug/min via INTRAVENOUS
  Filled 2018-10-14 (×3): qty 4

## 2018-10-14 MED ORDER — ENOXAPARIN SODIUM 40 MG/0.4ML ~~LOC~~ SOLN
40.0000 mg | SUBCUTANEOUS | Status: DC
  Administered 2018-10-14 – 2018-10-19 (×6): 40 mg via SUBCUTANEOUS
  Filled 2018-10-14 (×6): qty 0.4

## 2018-10-14 MED ORDER — SODIUM CHLORIDE 0.9 % IV BOLUS (SEPSIS): 1000.0000 mL | Freq: Once | INTRAVENOUS | Status: DC

## 2018-10-14 MED ORDER — NOREPINEPHRINE BITARTRATE 1 MG/ML IV SOLN
0.0000 ug/min | INTRAVENOUS | Status: DC
  Filled 2018-10-14: qty 4

## 2018-10-14 MED ORDER — ONDANSETRON HCL 4 MG/2ML IJ SOLN
4.0000 mg | Freq: Four times a day (QID) | INTRAMUSCULAR | Status: DC | PRN
  Administered 2018-10-19: 4 mg via INTRAVENOUS
  Filled 2018-10-14 (×2): qty 2

## 2018-10-14 NOTE — ED Notes (Signed)
MD notified of VS
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
## 2018-10-14 NOTE — Progress Notes (Signed)
A consult was received from an ED physician for cefepime per pharmacy dosing.  The patient's profile has been reviewed for ht/wt/allergies/indication/available labs.   A one time order has been placed for Cefepime 2 Gm.  Further antibiotics/pharmacy consults should be ordered by admitting physician if indicated.                       Thank you, Dorrene German 10/14/2018  1:38 AM
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
## 2018-10-14 NOTE — ED Notes (Signed)
Md aware of VS.

## 2018-10-14 NOTE — ED Notes (Signed)
Patient has a hx of sleep apnea.

## 2018-10-14 NOTE — Progress Notes (Signed)
PHARMACY NOTE -  Zosyn, Enoxaparin  Pharmacy has been assisting with dosing of Zosyn for aspiration pneumonia and Lovenox for VTE ppx.  SCr elevated but CrCl > 30 ml/min (even when adjusting SCr up for paraplegia  CBC wnl   Zosyn 3.375 g IV given once over 30 minutes, then every 8 hrs by 4-hr infusion  Enoxaparin 40 mg SQ q24 hr  Pharmacy will sign off, following peripherally for culture results or dose adjustments. Please reconsult if a change in clinical status warrants re-evaluation of dosage.  Reuel Boom, PharmD, BCPS [phone] 10/14/2018, 8:24 AM

## 2018-10-14 NOTE — ED Notes (Addendum)
Patient declines blood cultures, states he has no peripheral access, -Dr. Christy Gentles made aware-declines rectal temp

## 2018-10-15 ENCOUNTER — Inpatient Hospital Stay (HOSPITAL_COMMUNITY): Payer: Medicare Other

## 2018-10-15 DIAGNOSIS — K56609 Unspecified intestinal obstruction, unspecified as to partial versus complete obstruction: Secondary | ICD-10-CM

## 2018-10-15 DIAGNOSIS — J690 Pneumonitis due to inhalation of food and vomit: Secondary | ICD-10-CM

## 2018-10-15 DIAGNOSIS — L89154 Pressure ulcer of sacral region, stage 4: Secondary | ICD-10-CM

## 2018-10-15 LAB — CBC
HCT: 27.7 % — ABNORMAL LOW (ref 39.0–52.0)
Hemoglobin: 8.2 g/dL — ABNORMAL LOW (ref 13.0–17.0)
MCH: 24.5 pg — ABNORMAL LOW (ref 26.0–34.0)
MCHC: 29.6 g/dL — ABNORMAL LOW (ref 30.0–36.0)
MCV: 82.7 fL (ref 80.0–100.0)
Platelets: 358 10*3/uL (ref 150–400)
RBC: 3.35 MIL/uL — ABNORMAL LOW (ref 4.22–5.81)
RDW: 18.6 % — ABNORMAL HIGH (ref 11.5–15.5)
WBC: 10 10*3/uL (ref 4.0–10.5)
nRBC: 0 % (ref 0.0–0.2)

## 2018-10-15 LAB — MAGNESIUM: Magnesium: 1.3 mg/dL — ABNORMAL LOW (ref 1.7–2.4)

## 2018-10-15 LAB — COMPREHENSIVE METABOLIC PANEL
ALT: 18 U/L (ref 0–44)
AST: 16 U/L (ref 15–41)
Albumin: 2.1 g/dL — ABNORMAL LOW (ref 3.5–5.0)
Alkaline Phosphatase: 58 U/L (ref 38–126)
Anion gap: 10 (ref 5–15)
BUN: 42 mg/dL — ABNORMAL HIGH (ref 6–20)
CO2: 17 mmol/L — ABNORMAL LOW (ref 22–32)
Calcium: 7.7 mg/dL — ABNORMAL LOW (ref 8.9–10.3)
Chloride: 112 mmol/L — ABNORMAL HIGH (ref 98–111)
Creatinine, Ser: 0.87 mg/dL (ref 0.61–1.24)
GFR calc Af Amer: >60 mL/min (ref >60)
GFR calc non Af Amer: >60 mL/min (ref >60)
Glucose, Bld: 91 mg/dL (ref 70–99)
Potassium: 3.3 mmol/L — ABNORMAL LOW (ref 3.5–5.1)
Sodium: 139 mmol/L (ref 135–145)
Total Bilirubin: 0.5 mg/dL (ref 0.3–1.2)
Total Protein: 5.4 g/dL — ABNORMAL LOW (ref 6.5–8.1)

## 2018-10-15 LAB — MRSA PCR SCREENING: MRSA by PCR: POSITIVE — AB

## 2018-10-15 LAB — PHOSPHORUS: Phosphorus: 3.1 mg/dL (ref 2.5–4.6)

## 2018-10-15 MED ORDER — GENERIC EXTERNAL MEDICATION
12.50 | Status: DC
Start: ? — End: 2018-10-15

## 2018-10-15 MED ORDER — ALBUTEROL SULFATE (2.5 MG/3ML) 0.083% IN NEBU
2.50 | INHALATION_SOLUTION | RESPIRATORY_TRACT | Status: DC
Start: ? — End: 2018-10-15

## 2018-10-15 MED ORDER — PANTOPRAZOLE SODIUM 40 MG PO TBEC
40.00 | DELAYED_RELEASE_TABLET | ORAL | Status: DC
Start: 2018-10-12 — End: 2018-10-15

## 2018-10-15 MED ORDER — ENOXAPARIN SODIUM 40 MG/0.4ML ~~LOC~~ SOLN
40.00 | SUBCUTANEOUS | Status: DC
Start: 2018-10-12 — End: 2018-10-15

## 2018-10-15 MED ORDER — BENZOCAINE-MENTHOL 6-10 MG MT LOZG
1.00 | LOZENGE | OROMUCOSAL | Status: DC
Start: ? — End: 2018-10-15

## 2018-10-15 MED ORDER — ORAL CARE MOUTH RINSE
15.0000 mL | Freq: Two times a day (BID) | OROMUCOSAL | Status: DC
  Administered 2018-10-15: 17:00:00 15 mL via OROMUCOSAL

## 2018-10-15 MED ORDER — MORPHINE SULFATE (PF) 2 MG/ML IV SOLN
2.0000 mg | INTRAVENOUS | Status: DC | PRN
  Administered 2018-10-15: 2 mg via INTRAVENOUS
  Filled 2018-10-15: qty 1

## 2018-10-15 MED ORDER — FESOTERODINE FUMARATE ER 8 MG PO TB24
8.00 | ORAL_TABLET | ORAL | Status: DC
Start: 2018-10-12 — End: 2018-10-15

## 2018-10-15 MED ORDER — HEPARIN SODIUM LOCK FLUSH 100 UNIT/ML IV SOLN
500.00 | INTRAVENOUS | Status: DC
Start: ? — End: 2018-10-15

## 2018-10-15 MED ORDER — DIATRIZOATE MEGLUMINE & SODIUM 66-10 % PO SOLN
90.0000 mL | Freq: Once | ORAL | Status: AC
  Administered 2018-10-15: 14:00:00 90 mL via NASOGASTRIC
  Filled 2018-10-15: qty 90

## 2018-10-15 MED ORDER — HEPARIN SODIUM LOCK FLUSH 100 UNIT/ML IV SOLN
500.00 | INTRAVENOUS | Status: DC
Start: 2018-10-12 — End: 2018-10-15

## 2018-10-15 MED ORDER — LORAZEPAM 2 MG/ML IJ SOLN
1.00 | INTRAMUSCULAR | Status: DC
Start: ? — End: 2018-10-15

## 2018-10-15 MED ORDER — METOCLOPRAMIDE HCL 5 MG/ML IJ SOLN
10.00 | INTRAMUSCULAR | Status: DC
Start: 2018-10-12 — End: 2018-10-15

## 2018-10-15 MED ORDER — ONDANSETRON HCL 4 MG/2ML IJ SOLN
4.00 | INTRAMUSCULAR | Status: DC
Start: ? — End: 2018-10-15

## 2018-10-15 MED ORDER — POTASSIUM CHLORIDE 20 MEQ/15ML (10%) PO SOLN: 40.0000 meq | Freq: Two times a day (BID) | ORAL | Status: DC

## 2018-10-15 MED ORDER — ERYTHROMYCIN ETHYLSUCCINATE 200 MG/5ML PO SUSR
250.00 | ORAL | Status: DC
Start: 2018-10-12 — End: 2018-10-15

## 2018-10-15 MED ORDER — HYDROCODONE-ACETAMINOPHEN 5-325 MG PO TABS
1.00 | ORAL_TABLET | ORAL | Status: DC
Start: ? — End: 2018-10-15

## 2018-10-15 MED ORDER — CHLORHEXIDINE GLUCONATE 0.12 % MT SOLN
15.0000 mL | Freq: Two times a day (BID) | OROMUCOSAL | Status: DC
  Administered 2018-10-15 – 2018-10-19 (×8): 15 mL via OROMUCOSAL
  Filled 2018-10-15 (×9): qty 15

## 2018-10-15 MED ORDER — POTASSIUM CHLORIDE 10 MEQ/50ML IV SOLN
10.0000 meq | INTRAVENOUS | Status: AC
  Administered 2018-10-15 (×6): 10 meq via INTRAVENOUS
  Filled 2018-10-15 (×6): qty 50

## 2018-10-15 MED ORDER — MAGNESIUM SULFATE 2 GM/50ML IV SOLN
2.0000 g | Freq: Once | INTRAVENOUS | Status: AC
  Administered 2018-10-15: 2 g via INTRAVENOUS
  Filled 2018-10-15: qty 50
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
## 2018-10-15 NOTE — Progress Notes (Addendum)
NAME:  [name] [name], MRN:  [id number], DOB:  October 28, 1965, LOS: 1 ADMISSION DATE:  10/13/2018, CONSULTATION DATE: 10/14/2018 REFERRING MD:  Dr. Christy Gentles, ER, CHIEF COMPLAINT:  Vomiting   Brief History   53 y/o quadriplegic male admitted 5/30 with vomiting.  Has hx of gastroparesis.  Found to have low BP, SBO, and aspiration pneumonia.  He was in [facility] from [facility] to 10/11/18.  Required exploratory laparotomy and resection of necrotic bowel.  Post op course complicated by ileus and gastroparesis treated with reglan and erythromycin.  Family declined home health or SNF.  He lives with his sister.   Past Medical History  Seizures, Quadriplegia, Osteomyelitis of sacrum, OSA, HTN, SBO, Sepsis, PUD, HCAP, GERD, Depression, Obesity-hypoventilation syndrome  Significant Hospital Events   5/31 Admit  Consults:    Procedures:  Rt chest port >>   Significant Diagnostic Tests:  CT abd/pelvis 5/31 >> ASD RLL, suprapubic catheter in place, LLQ colostomy, moderate dilation of stomach and small bowel with transition point in RLQ around distal ileum, large decubitus ulcers with chronic osteomyelitis in hip and bony pelvis  Micro Data:  COVID 5/31 >> negative Blood 5/31 >> Urine 5/31 >>   Antimicrobials:  Zosyn 5/31 >>   Interim history/subjective:  Afebrile.  NGT drainage 2100 for last 24 hours.  Pt reports feeling much better since yesterday. No nausea.   Objective   Blood pressure 103/71, pulse 78, temperature 98.7 F (37.1 C), temperature source Oral, resp. rate 15, height 6' (1.829 m), weight 86.1 kg, SpO2 100 %.        Intake/Output Summary (Last 24 hours) at 10/15/2018 9371 Last data filed at 10/15/2018 0600 Gross per 24 hour  Intake 2995.76 ml  Output 4400 ml  Net -1404.24 ml   Filed Weights   10/14/18 1300 10/15/18 0500  Weight: 89.4 kg 86.1 kg    Examination: General: pleasant, chronically ill appearing male in NAD HEENT: MM pink/moist, old trach scar, [place] O2 Neuro: AAOx4,  speech clear, MAE CV: s1s2 rrr, no m/r/g PULM: even/non-labored, lungs bilaterally clear anterior  GI: protuberant, soft, non-tender, bsx4 hypoactive, midline surgical incision healing well, colostomy with air in bag  Extremities: warm/dry, no edema  Skin: sacral decubitus dressing c/d/i  Resolved Hospital Problem list     Assessment & Plan:   Small bowel obstruction. Hx of gastroparesis, PUD. Hx of colostomy. P: NPO  NGT to suction  Follow serial abdominal imaging  Continue PPI  CCS consult 6/1, appreciate assistance Hold Questran, Reglan, Carafate, Erythromycin Colostomy care per protocol   Acute on chronic hypoxic respiratory failure from aspiration pneumonia and decreased compliance in setting of SBO. Hx of OSA/OHS. P: Wean O2 for sats >92%.  Ok to wear O2 at night, off during day Hold CPAP / BiPAP due to vomiting  Follow intermittent CXR  D2/x abx   Septic Shock from pneumonia, volume depletion / SBO. 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 P: Abx as above  Wound care  Wean levophed to off for MAP >65 (pt reports he runs 90's/50's)  Prerenal azotemia >> baseline creatinine 0.39 from 05/21/18. Neurogenic bladder with chronic suprapubic catheter. P: Trend BMP / urinary output Replace electrolytes as indicated, K >4, Mg >2 goals with obstructive gas pattern  Avoid nephrotoxic agents, ensure adequate renal perfusion Catheter care per protocol  Hold home toviaz  Consider NGMA based on BMP and clinical scenario > may need Bicarb replacement   Hx of quadriplegia. P: Hold outpatient  baclofen   Best practice:  Diet: NPO DVT prophylaxis: Lovenox GI prophylaxis: Protonix Mobility: Bed rest Code Status: full code Disposition: ICU  Labs   CBC: Recent Labs  Lab 10/14/18 0150 10/15/18 0737  WBC 13.9* 10.0  HGB 10.6* 8.2*  HCT 36.1* 27.7*  MCV 80.9 82.7  PLT 456* 017    Basic Metabolic Panel: Recent Labs   Lab 10/14/18 0150 10/15/18 0737  NA 133* 139  K 4.4 3.3*  CL 101 112*  CO2 17* 17*  GLUCOSE 112* 91  BUN 60* 42*  CREATININE 1.91* 0.87  CALCIUM 8.7* 7.7*  MG  --  1.3*  PHOS  --  3.1   GFR: Estimated Creatinine Clearance: 109 mL/min (by C-G formula based on SCr of 0.87 mg/dL). Recent Labs  Lab 10/14/18 0150 10/15/18 0737  WBC 13.9* 10.0  LATICACIDVEN 1.0  --     Liver Function Tests: Recent Labs  Lab 10/14/18 0150 10/15/18 0737  AST 21 16  ALT 23 18  ALKPHOS 70 58  BILITOT 0.6 0.5  PROT 7.0 5.4*  ALBUMIN 2.9* 2.1*   Recent Labs  Lab 10/14/18 0150  LIPASE 36   No results for input(s): AMMONIA in the last 168 hours.  ABG    Component Value Date/Time   PHART 7.401 12/07/2014 2358   PCO2ART 35.1 12/07/2014 2358   PO2ART 129 (H) 12/07/2014 2358   HCO3 21.2 12/07/2014 2358   TCO2 27 08/27/2016 1047   ACIDBASEDEF 2.5 (H) 12/07/2014 2358   O2SAT 98.5 12/07/2014 2358     Coagulation Profile: No results for input(s): INR, PROTIME in the last 168 hours.  Cardiac Enzymes: No results for input(s): CKTOTAL, CKMB, CKMBINDEX, TROPONINI in the last 168 hours.  HbA1C: Hgb A1c MFr Bld  Date/Time Value Ref Range Status  06/02/2015 03:08 AM 5.9 (H) 4.8 - 5.6 % Final    Comment:    (NOTE)         Pre-diabetes: 5.7 - 6.4         Diabetes: >6.4         Glycemic control for adults with diabetes: <7.0   12/12/2009 05:46 AM (H) <5.7 % Final   5.7 (NOTE)                                                                       According to the ADA Clinical Practice Recommendations for 2011, when HbA1c is used as a screening test:   >=6.5%   Diagnostic of Diabetes Mellitus           (if abnormal result  is confirmed)  5.7-6.4%   Increased risk of developing Diabetes Mellitus  References:Diagnosis and Classification of Diabetes Mellitus,Diabetes PZWC,5852,77(OEUMP 1):S62-S69 and Standards of Medical Care in         Diabetes - 2011,Diabetes Care,2011,34  (Suppl 1):S11-S61.     CBG: No results for input(s): GLUCAP in the last 168 hours.   Critical care time: 59 minutes     Noe Gens, NP-C [facility] Pgr: [phone] or if no answer [phone] 10/15/2018, 9:07 AM

## 2018-10-15 NOTE — Progress Notes (Signed)
[place] Progress Note Patient Name: [name] [name] DOB: 12-28-1965 MRN: [id number]   Date of Service  10/15/2018  HPI/Events of Note  ?Patient c/o Abdominal Pain and has no PRN's ordered. Recent Exp Lap and resection of necrotic bowel. post op had ileus and gastroparesis.   eICU Interventions  Patient n.p.o.  Ordered PRN IV morphine only up to 2 doses.      Intervention Category Intermediate Interventions: Medication change / dose adjustment;Communication with other healthcare providers and/or family;Pain - evaluation and management  Mady Gemma 10/15/2018, 2:12 AM

## 2018-10-15 NOTE — Consult Note (Signed)
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 abdomen, tip not included. Mildly increased veiling opacity at both lung bases. Otherwise stable lungs. Stable cardiac size and mediastinal contours. Visualized tracheal air column is within normal limits. IMPRESSION: Mildly increased opacity at both lung bases since yesterday might reflect increased pneumonia, superimposed atelectasis or small pleural effusions. Electronically Signed   By: Genevie Ann M.D.   On: 10/15/2018 06:22   Dg Chest Port 1 View  Result Date: 10/14/2018 CLINICAL DATA:  Vomiting EXAM: PORTABLE CHEST 1 VIEW COMPARISON:  10/07/2018, 09/29/2018 FINDINGS:  Right-sided central venous port tip over the SVC. Patchy atelectasis at the right base. No consolidation or effusion. Stable cardiomediastinal silhouette. No pneumothorax. Wire cerclage above the thoracic inlet. IMPRESSION: Patchy atelectasis at the right base. Electronically Signed   By: Donavan Foil M.D.   On: 10/14/2018 02:39   Dg Abd Portable 1v  Result Date: 10/15/2018 CLINICAL DATA:  53 year old male with shortness of breath. Small bowel obstruction. Right lower lobe pneumonia. EXAM: PORTABLE ABDOMEN - 1 VIEW COMPARISON:  Portable chest today and CT Abdomen and Pelvis yesterday. FINDINGS: Portable AP view at 0420 hours. Enteric tube now in place and courses from the distal esophagus through the stomach and into the right abdomen compatible with tip placement at the junction of the 2nd and 3rd portions of the duodenum. Side hole placement is in the 2nd portion of the duodenum. Persistent dilated small bowel loops up to 40 millimeters. Stable gas pattern. Stable severe osseous  abnormality at both hips. IMPRESSION: 1. Enteric tube placed into the stomach and terminating in the duodenum, side hole at the 2nd portions of the duodenum. 2. Persistent small bowel obstruction gas pattern. Electronically Signed   By: Genevie Ann M.D.   On: 10/15/2018 06:24   Dg Abd Portable 1v  Result Date: 10/14/2018 CLINICAL DATA:  NG tube placement. EXAM: PORTABLE ABDOMEN - 1 VIEW COMPARISON:  Single-view of the abdomen earlier today. FINDINGS: NG tube has been repositioned. The tip is now in the distal stomach. IMPRESSION: As above. Electronically Signed   By: Inge Rise M.D.   On: 10/14/2018 14:02   Dg Abd Portable 1v  Result Date: 10/14/2018 CLINICAL DATA:  NG tube placement. EXAM: PORTABLE ABDOMEN - 1 VIEW COMPARISON:  None. FINDINGS: NG tube is looped at the gastroesophageal junction with the tip projecting retrograde approximately 8 cm in the distal esophagus. IMPRESSION: As above. Electronically Signed   By:  Inge Rise M.D.   On: 10/14/2018 11:04   . lactated ringers 100 mL/hr at 10/15/18 1052  . magnesium sulfate bolus IVPB 2 g (10/15/18 1052)  . norepinephrine (LEVOPHED) Adult infusion 2 mcg/min (10/15/18 1052)  . piperacillin-tazobactam (ZOSYN)  IV Stopped (10/15/18 1048)  . potassium chloride 10 mEq (10/15/18 1053)      Assessment/Plan Acute on chronic hypoxic respiratory failure from aspiration pneumonia Hx OSA Septic shock from pneumonia with volume depletion CKD Neurogenic bladder with chronic suprapubic catheter Quadriplegia Chronic sacral decubitus with osteomyelitis  SBO vs ileus with gastroparesis Exploratory laparotomy and resection of small area of necrotic bowel, loop gastrojejunostomy, appendectomy, 09/20/2018, Jearl Klinefelter, MD   FEN: IV fluids/NPO/NG ID:  Lajean Silvius 5/31 >> day 2 VZS:MOLMBEM Follow up:  Jearl Klinefelter, MD [facility]  Plan:  Nursing staff is going to place a larger NG, and then we will start the SBP, and follow with you.   Earnstine Regal [facility] Surgery 10/15/2018, 9:25 AM Pager: [phone] Consults: [phone]

## 2018-10-15 NOTE — Consult Note (Addendum)
[place] Nurse wound consult note Reason for Consult:Chronic full thickness pressure inuries to right posterior flank, Sacrum, buttocks and bilateral ischial tuberosities. LEft hip ans scattered nonhealing abrasions to feet and lower legs.  Wound type:Nonhealing pressure injuries Pressure Injury POA: Yes/No/NA Measurement:Right thoracic area Stage 4:  12cm x 4cm x 0.4cm with 80% red, 20 % yellow tissue. Minimal serosanguinous Sacrum/bilateral ischial tuberosity/perineal area and scrotum Stage 4:  80% healed with pink epithelialized tissue.  Twenty percent (20%) surface wounds in this Stage 4 pressure injury.  Irregularly shaped wound measuring approximately 22cm x 30cm x 0.2cm.  Friable. Small amount of serosanguinous exudate upon dressing removal. No odor.  Left hip Stage 3 Wound:  7cm x 6cm x 0.8cm with 80% pink, 20% slough from 12-3 o' clock. Undermining measuring 1.5cm from 9-12 o'clock.  Strong odor and moderate exudate noted upon dressing removal.   Partial thickness open areas at left knee, abdomen, right calf and two on left forearm (secondary to metal bar in hand splint).  These areas will be dressed with  foam and changed twice weekly and PRN. Wound bed: As described above Drainage (amount, consistency, odor) Moderate serosanguinous  Musty odor  Dressings saturated upon my assessment. Periwound: intact, dry and with evidence of previous wound healing Dressing procedure/placement/frequency:I will continue silver hydrofiber (Aquacel Ag+) to the right flank, left hip and buttocks wounds.. The abdominal wound (superficial) is to be dressed twice weekly with a silicone foam dressing. A mattress replacement with low air loss feature and bilateral Prevalon pressure redistribution Heel boots are provided.    [place] Nurse ostomy consult note Stoma type/location: LLQ colostomy (long standing) Patient has NG tube and on SBO protocol.  Brown stool present in pouch.  Stomal assessment/size: 1 and 5/8 inches  round, slightly raised, pale pink and moist. Os at center. Peristomal assessment: Not seen today, pouching system applied yesterday is intact and patient changes one weekly. Treatment options for stomal/peristomal skin: N/A Output: Brown stool, soft Ostomy pouching: 2pc., 2 and 3/4 inch pouching system.  Orders placed with supply numbers to keep a small supply at bedside.. Education provided: None required.  [place] nursing team will not follow, but will remain available to this patient, the nursing and medical teams.  Please re-consult if needed.  Domenic Moras MSN, RN, FNP-BC CWON Wound, Ostomy, Continence Nurse Pager [phone]

## 2018-10-16 ENCOUNTER — Inpatient Hospital Stay (HOSPITAL_COMMUNITY): Payer: Medicare Other

## 2018-10-16 DIAGNOSIS — K567 Ileus, unspecified: Secondary | ICD-10-CM

## 2018-10-16 LAB — URINE CULTURE: Culture: >=100000 — AB

## 2018-10-16 LAB — GLUCOSE, CAPILLARY
Glucose-Capillary: 107 mg/dL — ABNORMAL HIGH (ref 70–99)
Glucose-Capillary: 43 mg/dL — CL (ref 70–99)
Glucose-Capillary: 61 mg/dL — ABNORMAL LOW (ref 70–99)
Glucose-Capillary: 94 mg/dL (ref 70–99)
Glucose-Capillary: 61 mg/dL — ABNORMAL LOW (ref 70–99)
Glucose-Capillary: 64 mg/dL — ABNORMAL LOW (ref 70–99)
Glucose-Capillary: 86 mg/dL (ref 70–99)
Glucose-Capillary: 125 mg/dL — ABNORMAL HIGH (ref 70–99)
Glucose-Capillary: 103 mg/dL — ABNORMAL HIGH (ref 70–99)

## 2018-10-16 LAB — CBC
HCT: 25.8 % — ABNORMAL LOW (ref 39.0–52.0)
Hemoglobin: 7.6 g/dL — ABNORMAL LOW (ref 13.0–17.0)
MCH: 24.4 pg — ABNORMAL LOW (ref 26.0–34.0)
MCHC: 29.5 g/dL — ABNORMAL LOW (ref 30.0–36.0)
MCV: 82.7 fL (ref 80.0–100.0)
Platelets: 363 10*3/uL (ref 150–400)
RBC: 3.12 MIL/uL — ABNORMAL LOW (ref 4.22–5.81)
RDW: 18.7 % — ABNORMAL HIGH (ref 11.5–15.5)
WBC: 8.4 10*3/uL (ref 4.0–10.5)
nRBC: 0 % (ref 0.0–0.2)

## 2018-10-16 LAB — COMPREHENSIVE METABOLIC PANEL
ALT: 16 U/L (ref 0–44)
AST: 15 U/L (ref 15–41)
Albumin: 2.1 g/dL — ABNORMAL LOW (ref 3.5–5.0)
Alkaline Phosphatase: 50 U/L (ref 38–126)
Anion gap: 12 (ref 5–15)
BUN: 27 mg/dL — ABNORMAL HIGH (ref 6–20)
CO2: 17 mmol/L — ABNORMAL LOW (ref 22–32)
Calcium: 8.1 mg/dL — ABNORMAL LOW (ref 8.9–10.3)
Chloride: 116 mmol/L — ABNORMAL HIGH (ref 98–111)
Creatinine, Ser: 0.61 mg/dL (ref 0.61–1.24)
GFR calc Af Amer: >60 mL/min (ref >60)
GFR calc non Af Amer: >60 mL/min (ref >60)
Glucose, Bld: 60 mg/dL — ABNORMAL LOW (ref 70–99)
Potassium: 3.7 mmol/L (ref 3.5–5.1)
Sodium: 145 mmol/L (ref 135–145)
Total Bilirubin: 0.8 mg/dL (ref 0.3–1.2)
Total Protein: 5.2 g/dL — ABNORMAL LOW (ref 6.5–8.1)

## 2018-10-16 LAB — PHOSPHORUS: Phosphorus: 2.5 mg/dL (ref 2.5–4.6)

## 2018-10-16 LAB — MAGNESIUM: Magnesium: 1.6 mg/dL — ABNORMAL LOW (ref 1.7–2.4)

## 2018-10-16 MED ORDER — DEXTROSE IN LACTATED RINGERS 5 % IV SOLN
INTRAVENOUS | Status: DC
  Administered 2018-10-16: 10:00:00 via INTRAVENOUS

## 2018-10-16 MED ORDER — DEXTROSE 50 % IV SOLN
12.5000 g | INTRAVENOUS | Status: AC
  Administered 2018-10-16: 12.5 g via INTRAVENOUS

## 2018-10-16 MED ORDER — SODIUM BICARBONATE 8.4 % IV SOLN
INTRAVENOUS | Status: DC
  Administered 2018-10-16 – 2018-10-17 (×2): via INTRAVENOUS
  Filled 2018-10-16 (×5): qty 150

## 2018-10-16 MED ORDER — DEXTROSE 50 % IV SOLN
25.0000 g | INTRAVENOUS | Status: AC
  Administered 2018-10-16: 25 g via INTRAVENOUS

## 2018-10-16 MED ORDER — DEXTROSE 50 % IV SOLN
INTRAVENOUS | Status: AC
  Filled 2018-10-16: qty 50

## 2018-10-16 MED ORDER — LACTATED RINGERS IV BOLUS
500.0000 mL | Freq: Once | INTRAVENOUS | Status: AC
  Administered 2018-10-16: 500 mL via INTRAVENOUS

## 2018-10-16 MED ORDER — POTASSIUM CHLORIDE 10 MEQ/50ML IV SOLN
10.0000 meq | INTRAVENOUS | Status: AC
  Administered 2018-10-16 (×4): 10 meq via INTRAVENOUS
  Filled 2018-10-16 (×5): qty 50

## 2018-10-16 MED ORDER — DEXTROSE-NACL 5-0.9 % IV SOLN
INTRAVENOUS | Status: DC
  Administered 2018-10-16 – 2018-10-19 (×4): via INTRAVENOUS

## 2018-10-16 MED ORDER — MAGNESIUM SULFATE 2 GM/50ML IV SOLN
2.0000 g | Freq: Once | INTRAVENOUS | Status: AC
  Administered 2018-10-16: 2 g via INTRAVENOUS
  Filled 2018-10-16: qty 50

## 2018-10-16 NOTE — Progress Notes (Signed)
Hypoglycemic Event  CBG: 61  Treatment: 12.5g D50 Symptoms: None  Follow-up CBG: Time:1158 CBG Result:94  Possible Reasons for Event: NPO  Comments/MD notified: Noe Gens NP     [place]

## 2018-10-16 NOTE — Progress Notes (Signed)
Hypoglycemic Event  CBG: 64  Treatment: 8 oz orange juice   Symptoms: none   Follow-up CBG: Time:1706 CBG Result:86  Possible Reasons for Event: nPO  Comments/MD notified:   [name] [name]

## 2018-10-16 NOTE — Progress Notes (Signed)
Hypoglycemic Event  CBG: 61  Treatment: 4 oz orange juice Symptoms: none  Follow-up CBG: Time:1616 CBG Result:64  Possible Reasons for Event: NPO  Comments/MD notified:Brandi Ollis NP/ fluids changed     Inayah Woodin E Jerime Arif

## 2018-10-16 NOTE — Progress Notes (Signed)
Subjective/Chief Complaint: No acute changes overnight KUB reviewed   Objective: Vital signs in last 24 hours: Temp:  [97.6 F (36.4 C)-99.1 F (37.3 C)] 99.1 F (37.3 C) (06/02 0400) Pulse Rate:  [65-87] 69 (06/02 0400) Resp:  [11-20] 16 (06/02 0400) BP: (83-137)/(37-100) 125/58 (06/02 0400) SpO2:  [95 %-100 %] 98 % (06/02 0400) Weight:  [86.8 kg] 86.8 kg (06/02 0456) Last BM Date: 10/15/18  Intake/Output from previous day: 06/01 0701 - 06/02 0700 In: 2884.3 [I.V.:2405.4; IV Piggyback:479] Out: 4098 [Urine:1675; Emesis/NG output:1800; Stool:70] Intake/Output this shift: No intake/output data recorded.  Constitutional: No acute distress, conversant, appears states age. Eyes: Anicteric sclerae, moist conjunctiva, no lid lag Lungs: Clear to auscultation bilaterally, normal respiratory effort CV: regular rate and rhythm, no murmurs, no peripheral edema, pedal pulses 2+ GI: Soft, no masses or hepatosplenomegaly, non-tender to palpation, colostomy with output Skin: No rashes, palpation reveals normal turgor Psychiatric: appropriate judgment and insight, oriented to person, place, and time   Lab Results:  Recent Labs    10/15/18 0737 10/16/18 0450  WBC 10.0 8.4  HGB 8.2* 7.6*  HCT 27.7* 25.8*  PLT 358 363   BMET Recent Labs    10/15/18 0737 10/16/18 0450  NA 139 145  K 3.3* 3.7  CL 112* 116*  CO2 17* 17*  GLUCOSE 91 60*  BUN 42* 27*  CREATININE 0.87 0.61  CALCIUM 7.7* 8.1*    Studies/Results: Dg Chest Port 1 View  Result Date: 10/15/2018 CLINICAL DATA:  53 year old male with shortness of breath. Small bowel obstruction. Right lower lobe pneumonia. EXAM: PORTABLE CHEST 1 VIEW COMPARISON:  CT Abdomen and Pelvis 10/14/2018 and earlier. FINDINGS: Portable AP semi upright view at 0420 hours. Stable right chest power port, accessed. Enteric tube has been placed and courses to the abdomen, tip not included. Mildly increased veiling opacity at both lung bases.  Otherwise stable lungs. Stable cardiac size and mediastinal contours. Visualized tracheal air column is within normal limits. IMPRESSION: Mildly increased opacity at both lung bases since yesterday might reflect increased pneumonia, superimposed atelectasis or small pleural effusions. Electronically Signed   By: Genevie Ann M.D.   On: 10/15/2018 06:22   Dg Abd Portable 1v-small Bowel Obstruction Protocol-initial, 8 Hr Delay  Result Date: 10/16/2018 CLINICAL DATA:  Small-bowel obstruction EXAM: PORTABLE ABDOMEN - 1 VIEW COMPARISON:  10/15/2018 FINDINGS: There are persistently dilated loops of small bowel scattered throughout the abdomen. The majority of the oral contrast remains within small bowel loops in the right abdomen. There is some oral contrast in the ascending colon. There is a large amount of stool in the rectum and descending colon. The enteric tube is partially visualized. Chronic changes are noted of the pelvic bones. IMPRESSION: 1. The majority of the oral contrast remains within the small bowel. There are some persistently dilated loops of small bowel scattered throughout the abdomen. 2. Oral contrast is seen within the ascending colon. 3. Large amount of stool throughout the rectum. Electronically Signed   By: Constance Holster M.D.   On: 10/16/2018 00:18   Dg Abd Portable 1v-small Bowel Protocol-position Verification  Result Date: 10/15/2018 CLINICAL DATA:  NG tube placed EXAM: PORTABLE ABDOMEN - 1 VIEW COMPARISON:  None. FINDINGS: Nasogastric tube with the tip projecting over the antrum of the stomach. There is mild gaseous distension of the small bowel and colon. There is no evidence of pneumoperitoneum, portal venous gas or pneumatosis. There are no pathologic calcifications along the expected course of the ureters. The  osseous structures are unremarkable. IMPRESSION: Nasogastric tube with the tip projecting over the antrum of the stomach. Electronically Signed   By: Kathreen Devoid   On:  10/15/2018 13:13   Dg Abd Portable 1v  Result Date: 10/15/2018 CLINICAL DATA:  53 year old male with shortness of breath. Small bowel obstruction. Right lower lobe pneumonia. EXAM: PORTABLE ABDOMEN - 1 VIEW COMPARISON:  Portable chest today and CT Abdomen and Pelvis yesterday. FINDINGS: Portable AP view at 0420 hours. Enteric tube now in place and courses from the distal esophagus through the stomach and into the right abdomen compatible with tip placement at the junction of the 2nd and 3rd portions of the duodenum. Side hole placement is in the 2nd portion of the duodenum. Persistent dilated small bowel loops up to 40 millimeters. Stable gas pattern. Stable severe osseous abnormality at both hips. IMPRESSION: 1. Enteric tube placed into the stomach and terminating in the duodenum, side hole at the 2nd portions of the duodenum. 2. Persistent small bowel obstruction gas pattern. Electronically Signed   By: Genevie Ann M.D.   On: 10/15/2018 06:24   Dg Abd Portable 1v  Result Date: 10/14/2018 CLINICAL DATA:  NG tube placement. EXAM: PORTABLE ABDOMEN - 1 VIEW COMPARISON:  Single-view of the abdomen earlier today. FINDINGS: NG tube has been repositioned. The tip is now in the distal stomach. IMPRESSION: As above. Electronically Signed   By: Inge Rise M.D.   On: 10/14/2018 14:02   Dg Abd Portable 1v  Result Date: 10/14/2018 CLINICAL DATA:  NG tube placement. EXAM: PORTABLE ABDOMEN - 1 VIEW COMPARISON:  None. FINDINGS: NG tube is looped at the gastroesophageal junction with the tip projecting retrograde approximately 8 cm in the distal esophagus. IMPRESSION: As above. Electronically Signed   By: Inge Rise M.D.   On: 10/14/2018 11:04    Anti-infectives: Anti-infectives (From admission, onward)   Start     Dose/Rate Route Frequency Ordered Stop   10/14/18 1400  piperacillin-tazobactam (ZOSYN) IVPB 3.375 g     3.375 g 12.5 mL/hr over 240 Minutes Intravenous Every 8 hours 10/14/18 0805      10/14/18 0815  piperacillin-tazobactam (ZOSYN) IVPB 3.375 g     3.375 g 100 mL/hr over 30 Minutes Intravenous  Once 10/14/18 0805 10/14/18 1140   10/14/18 0145  ceFEPIme (MAXIPIME) 2 g in sodium chloride 0.9 % 100 mL IVPB     2 g 200 mL/hr over 30 Minutes Intravenous  Once 10/14/18 0132 10/14/18 0330   10/14/18 0145  metroNIDAZOLE (FLAGYL) IVPB 500 mg     500 mg 100 mL/hr over 60 Minutes Intravenous  Once 10/14/18 0132 10/14/18 0431      Assessment/Plan: Acute on chronic hypoxic respiratory failure from aspiration pneumonia Hx OSA Septic shock from pneumonia with volume depletion CKD Neurogenic bladder with chronic suprapubic catheter Quadriplegia Chronic sacral decubitus with osteomyelitis  Ileus & Gastroparesis Exploratory laparotomy and resection of small area of necrotic bowel, loop gastrojejunostomy, appendectomy, 09/20/2018, Jearl Klinefelter, MD   FEN: IV fluids/NPO/NG ID:  Aspiration PNA- Zosyn 5/31 >> day 3 [id number] Follow up:  Jearl Klinefelter, MD [facility]  Plan:  Con't NGT. Pt to have f/u KUB.  It appears that contrast is moving through SB.  Colostomy is putting out stool  LOS: 2 days    Ralene Ok 10/16/2018

## 2018-10-16 NOTE — Progress Notes (Signed)
Hypoglycemic Event  CBG: 43  Treatment: D50 25g  Symptoms: none  Follow-up CBG: Time: CBG Result:107  Possible Reasons for Event: NPO Comments/MD notified: Noe Gens NP    [place]

## 2018-10-16 NOTE — Progress Notes (Addendum)
NAME:  RANDEN KAUTH, MRN:  [id number], DOB:  September 04, 1965, LOS: 2 ADMISSION DATE:  10/13/2018, CONSULTATION DATE: 10/14/2018 REFERRING MD:  Dr. Christy Gentles, ER, CHIEF COMPLAINT:  Vomiting   Brief History   53 y/o quadriplegic male admitted 5/30 with vomiting.  Has hx of gastroparesis.  Found to have low BP, SBO, and aspiration pneumonia.  He was in [facility] from 09/15/18 to 10/11/18.  Required exploratory laparotomy and resection of necrotic bowel.  Post op course complicated by ileus and gastroparesis treated with reglan and erythromycin.  Family declined home health or SNF.  He lives with his sister.   Past Medical History  Seizures, Quadriplegia, Osteomyelitis of sacrum, OSA, HTN, SBO, Sepsis, PUD, HCAP, GERD, Depression, Obesity-hypoventilation syndrome  Significant Hospital Events   5/31 Admit 6/01 Weaning vasopressors, CCS consulted  Consults:  CCS 6/1, SBO  Procedures:  Rt chest port >>   Significant Diagnostic Tests:  CT abd/pelvis 5/31 >> ASD RLL, suprapubic catheter in place, LLQ colostomy, moderate dilation of stomach and small bowel with transition point in RLQ around distal ileum, large decubitus ulcers with chronic osteomyelitis in hip and bony pelvis  Micro Data:  COVID 5/31 >> negative Blood 5/31 >> Urine 5/31 >>   Antimicrobials:  Zosyn 5/31 >>   Interim history/subjective:  Afebrile.  Normal mentation / making urine.  Levophed at 1 mcg.  I/O - 3.5L out between NGT and UOP, net neg 682ml for las 24 hours.   Objective   Blood pressure 133/65, pulse 71, temperature 98.7 F (37.1 C), temperature source Oral, resp. rate 19, height 6' (1.829 m), weight 86.8 kg, SpO2 97 %.        Intake/Output Summary (Last 24 hours) at 10/16/2018 5277 Last data filed at 10/16/2018 8242 Gross per 24 hour  Intake 2881.48 ml  Output 2835 ml  Net 46.48 ml   Filed Weights   10/14/18 1300 10/15/18 0500 10/16/18 0456  Weight: 89.4 kg 86.1 kg 86.8 kg    Examination: General: chronically  ill appearing adult male lying in bed in NAD HEENT: MM pink/moist, fair dentition, wearing headphones Neuro: AAOx4, speech clear, gross motor of UE's, chronic contractures CV: s1s2 rrr, no m/r/g PULM: even/non-labored, lungs bilaterally clear anterior, diminished bases  PN:TIRW, non-tender, bsx4 active  Extremities: warm/dry, trace generalized edema  Skin: sacral decubitus   Resolved Hospital Problem list     Assessment & Plan:   Small bowel obstruction. Hx of gastroparesis, PUD. Hx of colostomy. P: NPO  NGT to LIS  Follow intermittent abdominal KUB PPI  Appreciate CCS evaluation  Hold home questran, reglan, carafate, erythromycin for now  Acute on chronic hypoxic respiratory failure from aspiration pneumonia and decreased compliance in setting of SBO. Hx of OSA/OHS. P: O2 for sats >92%.  [place] for patient to be off oxygen during day and wear at night due to OSA.  Hold CPAP/BiPAP due to vomiting / SBO Follow CXR intermittently  D3/x abx   Septic Shock from pneumonia, volume depletion / SBO. 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 P: ABX as above  Wound care  Wean levophed for MAP > 60 (pt reports he run's 90's/50's at home) LR 500 ml bolus x1 now > I/O neg with NGT drainage   Prerenal azotemia >> baseline creatinine 0.39 from 05/21/18. Neurogenic bladder with chronic suprapubic catheter. P: Trend BMP / urinary output Replace electrolytes as indicated, replace K/Mg, goal K >4, Mg >2 Avoid nephrotoxic agents, ensure adequate renal perfusion Catheter  care per protocol  Hold home toviaz  Change IVF to D5 with 3 amps bicarb at 28ml/hr   Hypoglycemia  -in setting of NPO status P: Add Dextrose to MIVF > D5LR at 148ml/hr  Assess CBG Q4 while NPO given episodes of hypoglycemia   Hx of quadriplegia. P: Hold baclofen   Best practice:  Diet: NPO DVT prophylaxis: Lovenox GI prophylaxis: Protonix Mobility: Bed rest Code  Status: full code Disposition: ICU  Labs   CBC: Recent Labs  Lab 10/14/18 0150 10/15/18 0737 10/16/18 0450  WBC 13.9* 10.0 8.4  HGB 10.6* 8.2* 7.6*  HCT 36.1* 27.7* 25.8*  MCV 80.9 82.7 82.7  PLT 456* 358 629    Basic Metabolic Panel: Recent Labs  Lab 10/14/18 0150 10/15/18 0737 10/16/18 0450  NA 133* 139 145  K 4.4 3.3* 3.7  CL 101 112* 116*  CO2 17* 17* 17*  GLUCOSE 112* 91 60*  BUN 60* 42* 27*  CREATININE 1.91* 0.87 0.61  CALCIUM 8.7* 7.7* 8.1*  MG  --  1.3* 1.6*  PHOS  --  3.1 2.5   GFR: Estimated Creatinine Clearance: 118.6 mL/min (by C-G formula based on SCr of 0.61 mg/dL). Recent Labs  Lab 10/14/18 0150 10/15/18 0737 10/16/18 0450  WBC 13.9* 10.0 8.4  LATICACIDVEN 1.0  --   --     Liver Function Tests: Recent Labs  Lab 10/14/18 0150 10/15/18 0737 10/16/18 0450  AST 21 16 15   ALT 23 18 16   ALKPHOS 70 58 50  BILITOT 0.6 0.5 0.8  PROT 7.0 5.4* 5.2*  ALBUMIN 2.9* 2.1* 2.1*   Recent Labs  Lab 10/14/18 0150  LIPASE 36   No results for input(s): AMMONIA in the last 168 hours.  ABG    Component Value Date/Time   PHART 7.401 12/07/2014 2358   PCO2ART 35.1 12/07/2014 2358   PO2ART 129 (H) 12/07/2014 2358   HCO3 21.2 12/07/2014 2358   TCO2 27 08/27/2016 1047   ACIDBASEDEF 2.5 (H) 12/07/2014 2358   O2SAT 98.5 12/07/2014 2358     Coagulation Profile: No results for input(s): INR, PROTIME in the last 168 hours.  Cardiac Enzymes: No results for input(s): CKTOTAL, CKMB, CKMBINDEX, TROPONINI in the last 168 hours.  HbA1C: Hgb A1c MFr Bld  Date/Time Value Ref Range Status  06/02/2015 03:08 AM 5.9 (H) 4.8 - 5.6 % Final    Comment:    (NOTE)         Pre-diabetes: 5.7 - 6.4         Diabetes: >6.4         Glycemic control for adults with diabetes: <7.0   12/12/2009 05:46 AM (H) <5.7 % Final   5.7 (NOTE)                                                                       According to the ADA Clinical Practice Recommendations for 2011,  when HbA1c is used as a screening test:   >=6.5%   Diagnostic of Diabetes Mellitus           (if abnormal result  is confirmed)  5.7-6.4%   Increased risk of developing Diabetes Mellitus  References:Diagnosis and Classification of Diabetes Mellitus,Diabetes UTML,4650,35(WSFKC 1):S62-S69 and Standards of Medical Care  in         Diabetes - 2011,Diabetes Care,2011,34  (Suppl 1):S11-S61.    CBG: Recent Labs  Lab 10/16/18 0837 10/16/18 0923  GLUCAP 43* 107*     Critical care time: 30 minutes     Noe Gens, NP-C [facility] Pgr: [phone] or if no answer [phone] 10/16/2018, 9:24 AM

## 2018-10-17 LAB — BASIC METABOLIC PANEL
Anion gap: 5 (ref 5–15)
BUN: 12 mg/dL (ref 6–20)
CO2: 24 mmol/L (ref 22–32)
Calcium: 7.5 mg/dL — ABNORMAL LOW (ref 8.9–10.3)
Chloride: 114 mmol/L — ABNORMAL HIGH (ref 98–111)
Creatinine, Ser: 0.39 mg/dL — ABNORMAL LOW (ref 0.61–1.24)
GFR calc Af Amer: >60 mL/min (ref >60)
GFR calc non Af Amer: >60 mL/min (ref >60)
Glucose, Bld: 122 mg/dL — ABNORMAL HIGH (ref 70–99)
Potassium: 3.5 mmol/L (ref 3.5–5.1)
Sodium: 143 mmol/L (ref 135–145)

## 2018-10-17 LAB — GLUCOSE, CAPILLARY
Glucose-Capillary: 112 mg/dL — ABNORMAL HIGH (ref 70–99)
Glucose-Capillary: 118 mg/dL — ABNORMAL HIGH (ref 70–99)
Glucose-Capillary: 123 mg/dL — ABNORMAL HIGH (ref 70–99)
Glucose-Capillary: 79 mg/dL (ref 70–99)
Glucose-Capillary: 86 mg/dL (ref 70–99)
Glucose-Capillary: 97 mg/dL (ref 70–99)

## 2018-10-17 LAB — CBC
HCT: 25.6 % — ABNORMAL LOW (ref 39.0–52.0)
Hemoglobin: 7.6 g/dL — ABNORMAL LOW (ref 13.0–17.0)
MCH: 24.4 pg — ABNORMAL LOW (ref 26.0–34.0)
MCHC: 29.7 g/dL — ABNORMAL LOW (ref 30.0–36.0)
MCV: 82.1 fL (ref 80.0–100.0)
Platelets: 353 10*3/uL (ref 150–400)
RBC: 3.12 MIL/uL — ABNORMAL LOW (ref 4.22–5.81)
RDW: 19.1 % — ABNORMAL HIGH (ref 11.5–15.5)
WBC: 8.1 10*3/uL (ref 4.0–10.5)
nRBC: 0 % (ref 0.0–0.2)

## 2018-10-17 LAB — MAGNESIUM: Magnesium: 1.6 mg/dL — ABNORMAL LOW (ref 1.7–2.4)

## 2018-10-17 MED ORDER — FESOTERODINE FUMARATE ER 8 MG PO TB24
8.0000 mg | ORAL_TABLET | Freq: Every day | ORAL | Status: DC
  Administered 2018-10-17 – 2018-10-20 (×4): 8 mg via ORAL
  Filled 2018-10-17 (×4): qty 1

## 2018-10-17 MED ORDER — BACLOFEN 20 MG PO TABS
20.0000 mg | ORAL_TABLET | Freq: Four times a day (QID) | ORAL | Status: DC
  Administered 2018-10-17 – 2018-10-20 (×12): 20 mg via ORAL
  Filled 2018-10-17 (×5): qty 2
  Filled 2018-10-17: qty 1
  Filled 2018-10-17 (×2): qty 2
  Filled 2018-10-17: qty 1
  Filled 2018-10-17 (×3): qty 2
  Filled 2018-10-17: qty 1

## 2018-10-17 MED ORDER — ZINC 50 MG PO TABS: 50.0000 mg | ORAL_TABLET | Freq: Two times a day (BID) | ORAL | Status: DC

## 2018-10-17 MED ORDER — PANTOPRAZOLE SODIUM 40 MG PO TBEC
40.0000 mg | DELAYED_RELEASE_TABLET | Freq: Two times a day (BID) | ORAL | Status: DC
  Administered 2018-10-17 – 2018-10-20 (×6): 40 mg via ORAL
  Filled 2018-10-17 (×6): qty 1

## 2018-10-17 MED ORDER — ADULT MULTIVITAMIN W/MINERALS CH
1.0000 | ORAL_TABLET | Freq: Every morning | ORAL | Status: DC
  Administered 2018-10-17 – 2018-10-20 (×4): 1 via ORAL
  Filled 2018-10-17 (×4): qty 1

## 2018-10-17 MED ORDER — CHLORHEXIDINE GLUCONATE CLOTH 2 % EX PADS
6.0000 | MEDICATED_PAD | Freq: Every day | CUTANEOUS | Status: DC
  Administered 2018-10-17 – 2018-10-19 (×3): 6 via TOPICAL

## 2018-10-17 MED ORDER — MAGNESIUM SULFATE 2 GM/50ML IV SOLN
2.0000 g | Freq: Once | INTRAVENOUS | Status: AC
  Administered 2018-10-17: 2 g via INTRAVENOUS
  Filled 2018-10-17: qty 50

## 2018-10-17 MED ORDER — VITAMIN C 500 MG PO TABS
500.0000 mg | ORAL_TABLET | Freq: Every day | ORAL | Status: DC
  Administered 2018-10-17 – 2018-10-20 (×4): 500 mg via ORAL
  Filled 2018-10-17 (×4): qty 1

## 2018-10-17 MED ORDER — ZINC SULFATE 220 (50 ZN) MG PO CAPS
220.0000 mg | ORAL_CAPSULE | Freq: Every day | ORAL | Status: DC
  Administered 2018-10-17 – 2018-10-20 (×4): 220 mg via ORAL
  Filled 2018-10-17 (×4): qty 1

## 2018-10-17 MED ORDER — SODIUM CHLORIDE 0.9% FLUSH
10.0000 mL | INTRAVENOUS | Status: DC | PRN
  Administered 2018-10-20: 10 mL
  Filled 2018-10-17: qty 40

## 2018-10-17 NOTE — Progress Notes (Signed)
dsg change to Sacrum, perineal area and scrotum area.Marland KitchenMarland KitchenMarland KitchenMarland Kitchen when dsg removed large amount of soft light brown thick stool covering wound, perineal area and scrotum.  Patient has colostomy , bag intact, stool from colostomy is liquid and dark brown.  Unknown drainage site.  Wound nurse paged for re-consult. Wet to dry saline dsg applied.

## 2018-10-17 NOTE — Progress Notes (Addendum)
NAME:  [name] [name], MRN:  [id number], DOB:  1966-03-05, LOS: 3 ADMISSION DATE:  10/13/2018, CONSULTATION DATE: 10/14/2018 REFERRING MD:  Dr. Christy Gentles, ER, CHIEF COMPLAINT:  Vomiting   Brief History   53 y/o quadriplegic male admitted 5/30 with vomiting.  Has hx of gastroparesis.  Found to have low BP, SBO, and aspiration pneumonia.  He was in [facility] from 09/15/18 to 10/11/18.  Required exploratory laparotomy and resection of necrotic bowel.  Post op course complicated by ileus and gastroparesis treated with reglan and erythromycin.  Family declined home health or SNF.  He lives with his sister.   Past Medical History  Seizures, Quadriplegia, Osteomyelitis of sacrum, OSA, HTN, SBO, Sepsis, PUD, HCAP, GERD, Depression, Obesity-hypoventilation syndrome  Significant Hospital Events   5/31 Admit 6/01 Weaning vasopressors, CCS consulted 6/02 Bicarb added for GI losses, hypoglycemia   Consults:  CCS 6/1, SBO  Procedures:  Rt chest port >>   Significant Diagnostic Tests:  CT abd/pelvis 5/31 >> ASD RLL, suprapubic catheter in place, LLQ colostomy, moderate dilation of stomach and small bowel with transition point in RLQ around distal ileum, large decubitus ulcers with chronic osteomyelitis in hip and bony pelvis  Micro Data:  COVID 5/31 >> negative Blood 5/31 >>  Urine 5/31 >> VRE >> felt to be colonizer  Antimicrobials:  Zosyn 5/31 >>   Interim history/subjective:  Pt denies acute complaints - no SOB, cough.  Afebrile.  Off pressors.  Maintaining MAP.    Objective   Blood pressure (!) 147/82, pulse 88, temperature 98.9 F (37.2 C), temperature source Oral, resp. rate 15, height 6' (1.829 m), weight 89 kg, SpO2 97 %.        Intake/Output Summary (Last 24 hours) at 10/17/2018 0759 Last data filed at 10/17/2018 0400 Gross per 24 hour  Intake 2384.78 ml  Output 1450 ml  Net 934.78 ml   Filed Weights   10/15/18 0500 10/16/18 0456 10/17/18 0427  Weight: 86.1 kg 86.8 kg 89 kg     Examination: General: chronically ill appearing adult male lying in bed on vent watching TV  HEENT: MM pink/moist, old trach scar Neuro: AAOx4, communicates appropriately, speech clear, slight movement of shoulders / arms but no fine motor skills  CV: s1s2 rrr, no m/r/g PULM: even/non-labored, lungs bilaterally clear  GI: soft, bsx4 hypoactive, colostomy, midline surgical incision healing, no drainage Extremities: warm/dry, trace generalized edema  Skin: sacral decubitus dressing intact   Resolved Hospital Problem list     Assessment & Plan:   Small bowel obstruction. Hx of gastroparesis, PUD. Hx of colostomy. P: Advance diet as tolerated  Follow intermittent KUB, repeat in AM CCS recommendations appreciated > signed off 6/3, hold reglan, erythromycin at discharge Hold carafate, questran for now   Acute on chronic hypoxic respiratory failure from aspiration pneumonia (NOS) and decreased compliance in setting of SBO. Hx of OSA/OHS. P: O2 at night as needed to support sats >92%. Ok to be off during day if maintaining saturations  No CPAP/BiPAP for now with resolving SBP Follow intermittent CXR  D4/x abx   Septic Shock from pneumonia, volume depletion / SBO. 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 P: ABX as above  WOC following, appreciate input Wound care per WOC recommendations   Prerenal azotemia >> baseline creatinine 0.39 from 05/21/18. 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 P: Continue bicarbonate gtt, follow up am labs then decide regarding cessation  Trend BMP / urinary output Replace electrolytes  as indicated Goal K >4, Mg > 2 Avoid nephrotoxic agents, ensure adequate renal perfusion Resume Toviaz  Hypoglycemia  -in setting of NPO status, poor intake  P: Dextrose in IVF Advance diet as tolerated > on clears at this point  CBG Q4 until glucose stabilizes   Hx of quadriplegia P:  Resume baclofen   VRE in Urine  P: Felt to be colonizer as he is improving without treatment / cultures returned after he was improving If new fever / symptoms, consider treatment  Best practice:  Diet: NPO DVT prophylaxis: Lovenox GI prophylaxis: Protonix Mobility: Bed rest Code Status: full code Disposition: SDU, to [facility] as of 6/4  Labs   CBC: Recent Labs  Lab 10/14/18 0150 10/15/18 0737 10/16/18 0450  WBC 13.9* 10.0 8.4  HGB 10.6* 8.2* 7.6*  HCT 36.1* 27.7* 25.8*  MCV 80.9 82.7 82.7  PLT 456* 358 456    Basic Metabolic Panel: Recent Labs  Lab 10/14/18 0150 10/15/18 0737 10/16/18 0450 10/17/18 0427  NA 133* 139 145  --   K 4.4 3.3* 3.7  --   CL 101 112* 116*  --   CO2 17* 17* 17*  --   GLUCOSE 112* 91 60*  --   BUN 60* 42* 27*  --   CREATININE 1.91* 0.87 0.61  --   CALCIUM 8.7* 7.7* 8.1*  --   MG  --  1.3* 1.6* 1.6*  PHOS  --  3.1 2.5  --    GFR: Estimated Creatinine Clearance: 118.6 mL/min (by C-G formula based on SCr of 0.61 mg/dL). Recent Labs  Lab 10/14/18 0150 10/15/18 0737 10/16/18 0450  WBC 13.9* 10.0 8.4  LATICACIDVEN 1.0  --   --     Liver Function Tests: Recent Labs  Lab 10/14/18 0150 10/15/18 0737 10/16/18 0450  AST 21 16 15   ALT 23 18 16   ALKPHOS 70 58 50  BILITOT 0.6 0.5 0.8  PROT 7.0 5.4* 5.2*  ALBUMIN 2.9* 2.1* 2.1*   Recent Labs  Lab 10/14/18 0150  LIPASE 36   No results for input(s): AMMONIA in the last 168 hours.  ABG    Component Value Date/Time   PHART 7.401 12/07/2014 2358   PCO2ART 35.1 12/07/2014 2358   PO2ART 129 (H) 12/07/2014 2358   HCO3 21.2 12/07/2014 2358   TCO2 27 08/27/2016 1047   ACIDBASEDEF 2.5 (H) 12/07/2014 2358   O2SAT 98.5 12/07/2014 2358     Coagulation Profile: No results for input(s): INR, PROTIME in the last 168 hours.  Cardiac Enzymes: No results for input(s): CKTOTAL, CKMB, CKMBINDEX, TROPONINI in the last 168 hours.  HbA1C: Hgb A1c MFr Bld  Date/Time Value Ref Range Status   06/02/2015 03:08 AM 5.9 (H) 4.8 - 5.6 % Final    Comment:    (NOTE)         Pre-diabetes: 5.7 - 6.4         Diabetes: >6.4         Glycemic control for adults with diabetes: <7.0   12/12/2009 05:46 AM (H) <5.7 % Final   5.7 (NOTE)  According to the ADA Clinical Practice Recommendations for 2011, when HbA1c is used as a screening test:   >=6.5%   Diagnostic of Diabetes Mellitus           (if abnormal result  is confirmed)  5.7-6.4%   Increased risk of developing Diabetes Mellitus  References:Diagnosis and Classification of Diabetes Mellitus,Diabetes JSUN,9914,44(PEAKL 1):S62-S69 and Standards of Medical Care in         Diabetes - 2011,Diabetes TYVD,7322,56  (Suppl 1):S11-S61.    CBG: Recent Labs  Lab 10/16/18 1706 10/16/18 2021 10/16/18 2311 10/17/18 0337 10/17/18 0737  GLUCAP 86 125* 103* 79 86     Critical care time: n/a    Noe Gens, NP-C [facility] Pgr: [phone] or if no answer [phone] 10/17/2018, 7:59 AM

## 2018-10-17 NOTE — Progress Notes (Signed)
[name] Progress Note Patient Name: FIDENCIO DUDDY DOB: 1966-03-02 MRN: [id number]   Date of Service  10/17/2018  HPI/Events of Note  Hypomagnesemia - Mg++ = 1.6 and Creatinine = 0.61.   eICU Interventions  Will replace Mg++.     Intervention Category Major Interventions: Electrolyte abnormality - evaluation and management  Sommer,Steven Eugene 10/17/2018, 6:21 AM

## 2018-10-17 NOTE — Progress Notes (Signed)
Subjective/Chief Complaint: Pt with no c/o this AM Tol FLD yesterday Colostomy con't to have output   Objective: Vital signs in last 24 hours: Temp:  [97.7 F (36.5 C)-98.9 F (37.2 C)] 98.9 F (37.2 C) (06/03 0400) Pulse Rate:  [63-88] 88 (06/03 0400) Resp:  [10-21] 15 (06/03 0400) BP: (83-147)/(38-82) 147/82 (06/03 0400) SpO2:  [95 %-99 %] 97 % (06/03 0400) Weight:  [89 kg] 89 kg (06/03 0427) Last BM Date: 10/16/18  Intake/Output from previous day: 06/02 0701 - 06/03 0700 In: 2384.8 [I.V.:2096.6; IV Piggyback:288.2] Out: 1450 [Urine:1000; Stool:450] Intake/Output this shift: No intake/output data recorded.  Constitutional: No acute distress, conversant, appears states age. Eyes: Anicteric sclerae, moist conjunctiva, no lid lag Lungs: Clear to auscultation bilaterally, normal respiratory effort CV: regular rate and rhythm, no murmurs, no peripheral edema, pedal pulses 2+ GI: Soft, no masses or hepatosplenomegaly, non-tender to palpation, ostomy patent/productive Skin: No rashes, palpation reveals normal turgor Psychiatric: appropriate judgment and insight, oriented to person, place, and time   Lab Results:  Recent Labs    10/15/18 0737 10/16/18 0450  WBC 10.0 8.4  HGB 8.2* 7.6*  HCT 27.7* 25.8*  PLT 358 363   BMET Recent Labs    10/15/18 0737 10/16/18 0450  NA 139 145  K 3.3* 3.7  CL 112* 116*  CO2 17* 17*  GLUCOSE 91 60*  BUN 42* 27*  CREATININE 0.87 0.61  CALCIUM 7.7* 8.1*   PT/INR No results for input(s): LABPROT, INR in the last 72 hours. ABG No results for input(s): PHART, HCO3 in the last 72 hours.  Invalid input(s): PCO2, PO2  Studies/Results: Dg Chest Port 1 View  Result Date: 10/16/2018 CLINICAL DATA:  Acute respiratory failure EXAM: PORTABLE CHEST 1 VIEW COMPARISON:  10/15/2018 FINDINGS: Cardiac shadow remains enlarged. Gastric catheter is noted within the stomach. Right chest wall port is seen. Increasing bibasilar opacification is  noted likely related to atelectasis. No pneumothorax is seen. No acute bony abnormality is noted. IMPRESSION: Increasing bibasilar atelectasis. Electronically Signed   By: Inez Catalina M.D.   On: 10/16/2018 07:51   Dg Abd Portable 1v  Result Date: 10/16/2018 CLINICAL DATA:  Follow up small bowel obstruction EXAM: PORTABLE ABDOMEN - 1 VIEW COMPARISON:  10/15/2018 FINDINGS: Previously administered contrast material now lies almost entirely within the colon with only a small amount within the distal ileum. No free air is seen. Gastric catheter is noted within the stomach. Chronic changes in the hip joints are noted IMPRESSION: Previously administered contrast now lies almost entirely within the colon. Electronically Signed   By: Inez Catalina M.D.   On: 10/16/2018 07:53   Dg Abd Portable 1v-small Bowel Obstruction Protocol-initial, 8 Hr Delay  Result Date: 10/16/2018 CLINICAL DATA:  Small-bowel obstruction EXAM: PORTABLE ABDOMEN - 1 VIEW COMPARISON:  10/15/2018 FINDINGS: There are persistently dilated loops of small bowel scattered throughout the abdomen. The majority of the oral contrast remains within small bowel loops in the right abdomen. There is some oral contrast in the ascending colon. There is a large amount of stool in the rectum and descending colon. The enteric tube is partially visualized. Chronic changes are noted of the pelvic bones. IMPRESSION: 1. The majority of the oral contrast remains within the small bowel. There are some persistently dilated loops of small bowel scattered throughout the abdomen. 2. Oral contrast is seen within the ascending colon. 3. Large amount of stool throughout the rectum. Electronically Signed   By: Jamie Kato.D.  On: 10/16/2018 00:18   Dg Abd Portable 1v-small Bowel Protocol-position Verification  Result Date: 10/15/2018 CLINICAL DATA:  NG tube placed EXAM: PORTABLE ABDOMEN - 1 VIEW COMPARISON:  None. FINDINGS: Nasogastric tube with the tip projecting  over the antrum of the stomach. There is mild gaseous distension of the small bowel and colon. There is no evidence of pneumoperitoneum, portal venous gas or pneumatosis. There are no pathologic calcifications along the expected course of the ureters. The osseous structures are unremarkable. IMPRESSION: Nasogastric tube with the tip projecting over the antrum of the stomach. Electronically Signed   By: Kathreen Devoid   On: 10/15/2018 13:13    Anti-infectives: Anti-infectives (From admission, onward)   Start     Dose/Rate Route Frequency Ordered Stop   10/14/18 1400  piperacillin-tazobactam (ZOSYN) IVPB 3.375 g     3.375 g 12.5 mL/hr over 240 Minutes Intravenous Every 8 hours 10/14/18 0805     10/14/18 0815  piperacillin-tazobactam (ZOSYN) IVPB 3.375 g     3.375 g 100 mL/hr over 30 Minutes Intravenous  Once 10/14/18 0805 10/14/18 1140   10/14/18 0145  ceFEPIme (MAXIPIME) 2 g in sodium chloride 0.9 % 100 mL IVPB     2 g 200 mL/hr over 30 Minutes Intravenous  Once 10/14/18 0132 10/14/18 0330   10/14/18 0145  metroNIDAZOLE (FLAGYL) IVPB 500 mg     500 mg 100 mL/hr over 60 Minutes Intravenous  Once 10/14/18 0132 10/14/18 0431      Assessment/Plan: Acute on chronic hypoxic respiratory failure from aspiration pneumonia Hx OSA Septic shock from pneumonia with volume depletion CKD Neurogenic bladder with chronic suprapubic catheter Quadriplegia Chronic sacral decubitus with osteomyelitis  Resolved Ileus  Exploratory laparotomy and resection of small area of necrotic bowel, loop gastrojejunostomy, appendectomy, 09/20/2018,Mark Curley Spice, MD   FEN: IV fluids per CCM, Adv diet as tol ID: Aspiration PNA- Zosyn 5/31 >>day 3 KTG:[id number] Follow TD:SKAJ Curley Spice, [place]  Plan: Adv diet as tol.  I don't think he is going to need reglan or erythromycin at this time and may just make his diarrhea worse.  He may need it at some point down the road, but will leave that up to  his operating surgeon.  We'll be available if needed.  LOS: 3 days    Ralene Ok 10/17/2018

## 2018-10-18 ENCOUNTER — Inpatient Hospital Stay (HOSPITAL_COMMUNITY): Payer: Medicare Other

## 2018-10-18 DIAGNOSIS — E876 Hypokalemia: Secondary | ICD-10-CM

## 2018-10-18 DIAGNOSIS — J181 Lobar pneumonia, unspecified organism: Secondary | ICD-10-CM

## 2018-10-18 DIAGNOSIS — J189 Pneumonia, unspecified organism: Secondary | ICD-10-CM

## 2018-10-18 DIAGNOSIS — E162 Hypoglycemia, unspecified: Secondary | ICD-10-CM

## 2018-10-18 LAB — BASIC METABOLIC PANEL
Anion gap: 4 — ABNORMAL LOW (ref 5–15)
BUN: 7 mg/dL (ref 6–20)
CO2: 27 mmol/L (ref 22–32)
Calcium: 7.5 mg/dL — ABNORMAL LOW (ref 8.9–10.3)
Chloride: 112 mmol/L — ABNORMAL HIGH (ref 98–111)
Creatinine, Ser: 0.36 mg/dL — ABNORMAL LOW (ref 0.61–1.24)
GFR calc Af Amer: >60 mL/min (ref >60)
GFR calc non Af Amer: >60 mL/min (ref >60)
Glucose, Bld: 106 mg/dL — ABNORMAL HIGH (ref 70–99)
Potassium: 3.2 mmol/L — ABNORMAL LOW (ref 3.5–5.1)
Sodium: 143 mmol/L (ref 135–145)

## 2018-10-18 LAB — GLUCOSE, CAPILLARY
Glucose-Capillary: 85 mg/dL (ref 70–99)
Glucose-Capillary: 85 mg/dL (ref 70–99)
Glucose-Capillary: 85 mg/dL (ref 70–99)
Glucose-Capillary: 85 mg/dL (ref 70–99)
Glucose-Capillary: 89 mg/dL (ref 70–99)
Glucose-Capillary: 96 mg/dL (ref 70–99)

## 2018-10-18 LAB — CBC
HCT: 27 % — ABNORMAL LOW (ref 39.0–52.0)
Hemoglobin: 7.8 g/dL — ABNORMAL LOW (ref 13.0–17.0)
MCH: 23.8 pg — ABNORMAL LOW (ref 26.0–34.0)
MCHC: 28.9 g/dL — ABNORMAL LOW (ref 30.0–36.0)
MCV: 82.3 fL (ref 80.0–100.0)
Platelets: 382 10*3/uL (ref 150–400)
RBC: 3.28 MIL/uL — ABNORMAL LOW (ref 4.22–5.81)
RDW: 18.8 % — ABNORMAL HIGH (ref 11.5–15.5)
WBC: 6.8 10*3/uL (ref 4.0–10.5)
nRBC: 0 % (ref 0.0–0.2)

## 2018-10-18 LAB — PHOSPHORUS: Phosphorus: 2.2 mg/dL — ABNORMAL LOW (ref 2.5–4.6)

## 2018-10-18 LAB — MAGNESIUM: Magnesium: 1.6 mg/dL — ABNORMAL LOW (ref 1.7–2.4)

## 2018-10-18 MED ORDER — SIMETHICONE 80 MG PO CHEW
80.0000 mg | CHEWABLE_TABLET | Freq: Once | ORAL | Status: AC
  Administered 2018-10-18: 80 mg via ORAL
  Filled 2018-10-18: qty 1

## 2018-10-18 MED ORDER — POTASSIUM CHLORIDE CRYS ER 20 MEQ PO TBCR
40.0000 meq | EXTENDED_RELEASE_TABLET | ORAL | Status: AC
  Administered 2018-10-18 (×2): 40 meq via ORAL
  Filled 2018-10-18 (×2): qty 2

## 2018-10-18 MED ORDER — MAGNESIUM SULFATE 2 GM/50ML IV SOLN
2.0000 g | Freq: Once | INTRAVENOUS | Status: AC
  Administered 2018-10-18: 2 g via INTRAVENOUS
  Filled 2018-10-18: qty 50

## 2018-10-18 NOTE — Progress Notes (Addendum)
PROGRESS NOTE    [name] [name]  YQM:[id number] DOB: Apr 27, 1966 DOA: 10/13/2018 PCP: Gerald Leitz    Brief Narrative:  53 year old male who presented with vomiting.  He does have significant past medical history for quadriplegia and gastroparesis.  He had exploratory laparotomy and colostomy placement for necrotic bowel May 2020 at [facility].  Apparently he did have ileus and gastroparesis post procedure.  On this occasion he had symptoms for about 24 hours, associated with dyspnea.  On his initial physical examination his blood pressure was 89/52, heart rate 92, temperature 98.3, oxygen saturation 99% respiratory rate 15.  He had decreased breath sounds, scattered rhonchi but no wheezing bilaterally, heart S1-S2 present rhythmic, his abdomen was distended with decreased bowel sounds, colostomy in place, no lower extremity edema, stage IV sacral wounds.  His chest radiograph had a right lower lobe infiltrate, T of the abdomen showed diffuse small bowel dilatation with transition point in the distal ileum, suspicious for distal small bowel obstruction.  Increased right lower lobe airspace opacity, stable appearance of large decubitus ulcers with chronic osteomyelitis, involving the hips and pelvis.  Patient was admitted to the hospital working diagnosis of small bowel obstruction, complicated by right lower lobe aspiration pneumonia and sepsis.  Present on admission.  Assessment & Plan:   Principal Problem:   Sepsis ([place]) Active Problems:   Quadriplegia ([place])   Sacral decubitus ulcer, stage IV ([name]   Hypokalemia   Right lower lobe pneumonia ([name]   Hypoglycemia   1. Small bowel obstruction. Clinical continue to improve, he has been tolerating full liquid diet, will continue to advance to regular diet, continue as needed antiemetics and close monitoring.   2. Acute on chronic hypoxic respiratory failure due to aspiration pneumonia, complicated with septic shock.  Systolic blood pressure 528 to 97 mmHg, will continue close monitoring, keep MAP above 60 mmHg. His last echo from old records from 2016 with preserved LV systolic function EF 55 to 60%. Continue antibiotic therapy with Zosyn. WBC at 6,8.   3. Neurogenic bladder. Urine output over last 24 H is 1,800 ml. Renal function with serum cr at 0.36,  4. Non gap metabolic acidosis, Hypoglycemia, Hypokalemia and hypomagnesemia. K at 3,2 and Mg at 1,6, will continue electrolyte correction with Kcl and Mg sulfate, 80 meq Kcl and 2 grams of Mag sulfate, follow up renal panel in am. Fasting glucose today at 106, will continue IV dextrose for now, advance diet. Serum bicarbonate at 27 this am will discontinue bicarb drip.   4. Quadriplegia. Poor prognosis, patient not ambulatory.   5. 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 Continue local wound care. Consult wound care team.    DVT prophylaxis: enoxaparin   Code Status: full Family Communication: no family at the bedside  Disposition Plan/ discharge barriers: pending clinical improvement  Body mass index is 26.4 kg/m. Malnutrition Type:  Nutrition Problem: Increased nutrient needs Etiology: wound healing   Malnutrition Characteristics:  Signs/Symptoms: estimated needs   Nutrition Interventions:  Interventions: Refer to RD note for recommendations  RN Pressure Injury Documentation:    Consultants:     Procedures:     Antimicrobials:       Subjective: Patient has been tolerating po well, no nausea or vomiting, no chest pain or dyspnea.   Objective: Vitals:   10/18/18 0300 10/18/18 0343 10/18/18 0400 10/18/18 0409  BP:   125/81   Pulse: 79  84   Resp: 12  13  Temp:  98.5 F (36.9 C)    TempSrc:  Axillary    SpO2: 95%  96%   Weight:    88.3 kg  Height:        Intake/Output Summary (Last 24 hours) at 10/18/2018 0800 Last data filed at 10/18/2018 0618 Gross per 24 hour  Intake 3058.18 ml  Output 1800 ml  Net  1258.18 ml   Filed Weights   10/16/18 0456 10/17/18 0427 10/18/18 0409  Weight: 86.8 kg 89 kg 88.3 kg    Examination:   General: deconditioned  Neurology: Awake and alert, non focal  E ENT: mild pallor, no icterus, oral mucosa moist Cardiovascular: No JVD. S1-S2 present, rhythmic, no gallops, rubs, or murmurs. No lower extremity edema. Pulmonary: positive breath sounds bilaterally, decreased breath souds at bases with no wheezing, rhonchi or rales. Gastrointestinal. Abdomen mild distended with colostomy in place, no organomegaly, non tender, no rebound or guarding Skin. No rashes Musculoskeletal: no joint deformities     Data Reviewed: I have personally reviewed following labs and imaging studies  CBC: Recent Labs  Lab 10/14/18 0150 10/15/18 0737 10/16/18 0450 10/17/18 1038 10/18/18 0345  WBC 13.9* 10.0 8.4 8.1 6.8  HGB 10.6* 8.2* 7.6* 7.6* 7.8*  HCT 36.1* 27.7* 25.8* 25.6* 27.0*  MCV 80.9 82.7 82.7 82.1 82.3  PLT 456* 358 363 353 211   Basic Metabolic Panel: Recent Labs  Lab 10/14/18 0150 10/15/18 0737 10/16/18 0450 10/17/18 0427 10/17/18 1038 10/18/18 0345  NA 133* 139 145  --  143 143  K 4.4 3.3* 3.7  --  3.5 3.2*  CL 101 112* 116*  --  114* 112*  CO2 17* 17* 17*  --  24 27  GLUCOSE 112* 91 60*  --  122* 106*  BUN 60* 42* 27*  --  12 7  CREATININE 1.91* 0.87 0.61  --  0.39* 0.36*  CALCIUM 8.7* 7.7* 8.1*  --  7.5* 7.5*  MG  --  1.3* 1.6* 1.6*  --  1.6*  PHOS  --  3.1 2.5  --   --  2.2*   GFR: Estimated Creatinine Clearance: 118.6 mL/min (A) (by C-G formula based on SCr of 0.36 mg/dL (L)). Liver Function Tests: Recent Labs  Lab 10/14/18 0150 10/15/18 0737 10/16/18 0450  AST 21 16 15   ALT 23 18 16   ALKPHOS 70 58 50  BILITOT 0.6 0.5 0.8  PROT 7.0 5.4* 5.2*  ALBUMIN 2.9* 2.1* 2.1*   Recent Labs  Lab 10/14/18 0150  LIPASE 36   No results for input(s): AMMONIA in the last 168 hours. Coagulation Profile: No results for input(s): INR, PROTIME  in the last 168 hours. Cardiac Enzymes: No results for input(s): CKTOTAL, CKMB, CKMBINDEX, TROPONINI in the last 168 hours. BNP (last 3 results) No results for input(s): PROBNP in the last 8760 hours. HbA1C: No results for input(s): HGBA1C in the last 72 hours. CBG: Recent Labs  Lab 10/17/18 1536 10/17/18 1947 10/17/18 2338 10/18/18 0313 10/18/18 0736  GLUCAP 118* 123* 112* 85 85   Lipid Profile: No results for input(s): CHOL, HDL, LDLCALC, TRIG, CHOLHDL, LDLDIRECT in the last 72 hours. Thyroid Function Tests: No results for input(s): TSH, T4TOTAL, FREET4, T3FREE, THYROIDAB in the last 72 hours. Anemia Panel: No results for input(s): VITAMINB12, FOLATE, FERRITIN, TIBC, IRON, RETICCTPCT in the last 72 hours.    Radiology Studies: I have reviewed all of the imaging during this hospital visit personally     Scheduled Meds: . baclofen  20  mg Oral QID  . chlorhexidine  15 mL Mouth Rinse BID  . Chlorhexidine Gluconate Cloth  6 each Topical Daily  . Chlorhexidine Gluconate Cloth  6 each Topical Daily  . enoxaparin (LOVENOX) injection  40 mg Subcutaneous Q24H  . fesoterodine  8 mg Oral Daily  . mouth rinse  15 mL Mouth Rinse q12n4p  . multivitamin with minerals  1 tablet Oral q morning - 10a  . pantoprazole  40 mg Oral BID  . vitamin C  500 mg Oral Daily  . zinc sulfate  220 mg Oral Daily   Continuous Infusions: . dextrose 5 % and 0.9% NaCl 50 mL/hr at 10/18/18 0000  . piperacillin-tazobactam (ZOSYN)  IV 3.375 g (10/18/18 0618)  .  sodium bicarbonate  infusion 1000 mL 50 mL/hr at 10/18/18 0000     LOS: 4 days        Mauricio Gerome Apley, MD

## 2018-10-18 NOTE — Consult Note (Signed)
[place] Nurse wound follow up Stage 4 pressure injury to sacrum/buttocks.  Full assessment of all wounds completed and documented 6.1.20.  Orders for bedside RN topical wound care present.  No further WOC needs at this time.  Will not follow at this time.  Please re-consult if needed.  Domenic Moras MSN, RN, FNP-BC CWON Wound, Ostomy, Continence Nurse Pager [phone]

## 2018-10-18 NOTE — Progress Notes (Signed)
Patient reports gas/indigestion. MD M. Earlham notified.

## 2018-10-19 DIAGNOSIS — E162 Hypoglycemia, unspecified: Secondary | ICD-10-CM

## 2018-10-19 DIAGNOSIS — G8250 Quadriplegia, unspecified: Secondary | ICD-10-CM

## 2018-10-19 LAB — CBC WITH DIFFERENTIAL/PLATELET
Abs Immature Granulocytes: 0.05 10*3/uL (ref 0.00–0.07)
Basophils Absolute: 0 10*3/uL (ref 0.0–0.1)
Basophils Relative: 0 %
Eosinophils Absolute: 0.7 10*3/uL — ABNORMAL HIGH (ref 0.0–0.5)
Eosinophils Relative: 7 %
HCT: 27.9 % — ABNORMAL LOW (ref 39.0–52.0)
Hemoglobin: 8.2 g/dL — ABNORMAL LOW (ref 13.0–17.0)
Immature Granulocytes: 1 %
Lymphocytes Relative: 13 %
Lymphs Abs: 1.4 10*3/uL (ref 0.7–4.0)
MCH: 24.3 pg — ABNORMAL LOW (ref 26.0–34.0)
MCHC: 29.4 g/dL — ABNORMAL LOW (ref 30.0–36.0)
MCV: 82.5 fL (ref 80.0–100.0)
Monocytes Absolute: 1 10*3/uL (ref 0.1–1.0)
Monocytes Relative: 10 %
Neutro Abs: 7.2 10*3/uL (ref 1.7–7.7)
Neutrophils Relative %: 69 %
Platelets: 362 10*3/uL (ref 150–400)
RBC: 3.38 MIL/uL — ABNORMAL LOW (ref 4.22–5.81)
RDW: 18.8 % — ABNORMAL HIGH (ref 11.5–15.5)
WBC: 10.3 10*3/uL (ref 4.0–10.5)
nRBC: 0 % (ref 0.0–0.2)

## 2018-10-19 LAB — BASIC METABOLIC PANEL
Anion gap: 4 — ABNORMAL LOW (ref 5–15)
BUN: 6 mg/dL (ref 6–20)
CO2: 26 mmol/L (ref 22–32)
Calcium: 7.6 mg/dL — ABNORMAL LOW (ref 8.9–10.3)
Chloride: 111 mmol/L (ref 98–111)
Creatinine, Ser: 0.31 mg/dL — ABNORMAL LOW (ref 0.61–1.24)
GFR calc Af Amer: >60 mL/min (ref >60)
GFR calc non Af Amer: >60 mL/min (ref >60)
Glucose, Bld: 97 mg/dL (ref 70–99)
Potassium: 4.2 mmol/L (ref 3.5–5.1)
Sodium: 141 mmol/L (ref 135–145)

## 2018-10-19 LAB — GLUCOSE, CAPILLARY
Glucose-Capillary: 88 mg/dL (ref 70–99)
Glucose-Capillary: 90 mg/dL (ref 70–99)
Glucose-Capillary: 121 mg/dL — ABNORMAL HIGH (ref 70–99)
Glucose-Capillary: 93 mg/dL (ref 70–99)

## 2018-10-19 LAB — MAGNESIUM: Magnesium: 1.5 mg/dL — ABNORMAL LOW (ref 1.7–2.4)

## 2018-10-19 MED ORDER — MAGNESIUM SULFATE 2 GM/50ML IV SOLN
2.0000 g | Freq: Once | INTRAVENOUS | Status: AC
  Administered 2018-10-19: 2 g via INTRAVENOUS
  Filled 2018-10-19: qty 50

## 2018-10-19 NOTE — Plan of Care (Signed)
  Problem: Education: Goal: Knowledge of General Education information will improve Description Including pain rating scale, medication(s)/side effects and non-pharmacologic comfort measures Outcome: Completed/Met

## 2018-10-19 NOTE — Progress Notes (Signed)
Received report from S. Hoefler,RN. No change in assessment. Dressing changes completed prior to transfer per schedule.   Will continue to monitor.Stacey Drain

## 2018-10-19 NOTE — Progress Notes (Signed)
PROGRESS NOTE    [name] [name]  MGQ:[id number] DOB: [birth year] DOA: 10/13/2018 PCP: Gerald Leitz    Brief Narrative:  53 year old male who presented with vomiting.  He does have significant past medical history for quadriplegia and gastroparesis.  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  Apparently he did have ileus and gastroparesis post procedure.  On this occasion he had symptoms for about 24 hours, associated with dyspnea.  On his initial physical examination his blood pressure was 89/52, heart rate 92, temperature 98.3, oxygen saturation 99% respiratory rate 15.  He had decreased breath sounds, scattered rhonchi but no wheezing bilaterally, heart S1-S2 present rhythmic, his abdomen was distended with decreased bowel sounds, colostomy in place, no lower extremity edema, stage IV sacral wounds.  His chest radiograph had a right lower lobe infiltrate, T of the abdomen showed diffuse small bowel dilatation with transition point in the distal ileum, suspicious for distal small bowel obstruction.  Increased right lower lobe airspace opacity, stable appearance of large decubitus ulcers with chronic osteomyelitis, involving the hips and pelvis.  Patient was admitted to the hospital working diagnosis of small bowel obstruction, complicated by right lower lobe aspiration pneumonia and sepsis.  Present on admission.    Assessment & Plan:   Principal Problem:   Sepsis ([place]) Active Problems:   Quadriplegia ([place])   Sacral decubitus ulcer, stage IV ([name]   Hypokalemia   Right lower lobe pneumonia ([name]   Hypoglycemia   1. Small bowel obstruction. Patient with dyspepsia but able to tolerated po intake with no nausea or vomiting. Continue to have increase gas, but no significant pain. Will continue close monitoring.   2. Acute on chronic hypoxic respiratory failure due to aspiration pneumonia, complicated with  septic shock. Patient has remained hemodynamically stable, continue antibiotic therapy with Zosyn #5/8, will plan to transition to Augmentin at discharge. Oxygenating well at 100% on 2 LPM per Jet, will plan to wean off supplemental oxygen today. Keep oxygen saturation greater than 92%.   3. Neurogenic bladder. No clinical signs of urinary retention, renal function continue to be stable, with serum cr at 0.31.   4. Non gap metabolic acidosis, Hypoglycemia, Hypokalemia and hypomagnesemia. Improved K up to 4,2, serum bicarbonate at 26, and renal functio stable with serum cr at 0.31. Will continue to correct Mg with Mag sulfate IV 2 grams, serum Mg today persistently low at 1,5.   4. Quadriplegia. Plan to return home under the care of his family. Continue baclofen.   5. 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 Local wound care, poor prognosis, patient not ambulatory.   DVT prophylaxis: enoxaparin   Code Status: full Family Communication: no family at the bedside  Disposition Plan/ discharge barriers: possible dc home in am.   Body mass index is 27.51 kg/m. Malnutrition Type:  Nutrition Problem: Increased nutrient needs Etiology: wound healing   Malnutrition Characteristics:  Signs/Symptoms: estimated needs   Nutrition Interventions:  Interventions: Refer to RD note for recommendations  RN Pressure Injury Documentation: Pressure Injury 07/19/17 Stage IV - Full thickness tissue loss with exposed bone, tendon or muscle. pale, clean, pink, non granulating wound bed (Active)  07/19/17 1200  Location: Sacrum  Location Orientation: Right;Left  Staging: Stage IV - Full thickness tissue loss with exposed bone, tendon or muscle.  Wound Description (Comments): pale, clean, pink, non granulating wound bed  Present on Admission: Yes     Pressure Injury  07/19/17 Stage IV - Full thickness tissue loss with exposed bone, tendon or muscle. pale, clean, pink, non granular, slick in  apperance (Active)  07/19/17 1200  Location: Other (Comment)  Location Orientation: Left  Staging: Stage IV - Full thickness tissue loss with exposed bone, tendon or muscle.  Wound Description (Comments): pale, clean, pink, non granular, slick in apperance  Present on Admission: Yes     Pressure Injury 10/03/17 Stage II -  Partial thickness loss of dermis presenting as a shallow open ulcer with a red, pink wound bed without slough. dime size (Active)  10/03/17 0130  Location: Heel  Location Orientation: Right  Staging: Stage II -  Partial thickness loss of dermis presenting as a shallow open ulcer with a red, pink wound bed without slough.  Wound Description (Comments): dime size  Present on Admission: Yes     Pressure Injury 10/30/17 Stage IV - Full thickness tissue loss with exposed bone, tendon or muscle. (Active)  10/30/17 2300  Location: Sacrum  Location Orientation: Right;Left  Staging: Stage IV - Full thickness tissue loss with exposed bone, tendon or muscle.  Wound Description (Comments):   Present on Admission: Yes     Pressure Injury 05/19/18 Stage IV - Full thickness tissue loss with exposed bone, tendon or muscle. very large, extensive sacral/buttocks pressure ulcer, malodorous (Active)  05/19/18 0258  Location: Sacrum  Location Orientation: Right;Left  Staging: Stage IV - Full thickness tissue loss with exposed bone, tendon or muscle.  Wound Description (Comments): very large, extensive sacral/buttocks pressure ulcer, malodorous  Present on Admission: Yes     Pressure Injury 05/19/18 Back Right;Upper Stage II -  Partial thickness loss of dermis presenting as a shallow open ulcer with a red, pink wound bed without slough. Stage 2 pressure to R upper lateral back (Active)  05/19/18 0258  Location: Back  Location Orientation: Right;Upper  Staging: Stage II -  Partial thickness loss of dermis presenting as a shallow open ulcer with a red, pink wound bed without slough.    Wound Description (Comments): Stage 2 pressure to R upper lateral back  Present on Admission: Yes     Pressure Injury 05/19/18 Stage IV - Full thickness tissue loss with exposed bone, tendon or muscle. stage IV pressure ulcer to LEFT Ischial tuberosity, red base, malodorous with minimal serous drainage (Active)  05/19/18 0258  Location: Ischial tuberosity  Location Orientation: Left  Staging: Stage IV - Full thickness tissue loss with exposed bone, tendon or muscle.  Wound Description (Comments): stage IV pressure ulcer to LEFT Ischial tuberosity, red base, malodorous with minimal serous drainage  Present on Admission: Yes     Pressure Injury 05/21/18 Stage IV - Full thickness tissue loss with exposed bone, tendon or muscle. left posterior hip (Active)  05/21/18 1308  Location: Hip  Location Orientation: Posterior;Left;Proximal  Staging: Stage IV - Full thickness tissue loss with exposed bone, tendon or muscle.  Wound Description (Comments): left posterior hip  Present on Admission: Yes     Pressure Injury 10/14/18 Hip Left Unstageable - Full thickness tissue loss in which the base of the ulcer is covered by slough (yellow, tan, gray, green or brown) and/or eschar (tan, brown or black) in the wound bed. Pink wound with yellow slough (Active)  10/14/18 1200  Location: Hip  Location Orientation: Left  Staging: Unstageable - Full thickness tissue loss in which the base of the ulcer is covered by slough (yellow, tan, gray, green or brown) and/or eschar (tan, brown  or black) in the wound bed.  Wound Description (Comments): Pink wound with yellow slough  Present on Admission: Yes     Consultants:     Procedures:     Antimicrobials:   IV zosyn     Subjective: Patient is feeling better, but not yet back to baseline, continue to have significant gas per colostomy bag, no nausea or vomiting and has been tolerating po well. Had dyspepsia last night.   Objective: Vitals:    10/19/18 0445 10/19/18 0500 10/19/18 0515 10/19/18 0600  BP:  (!) 91/46  (!) 102/53  Pulse: 82 79 78 80  Resp: 15 16 17 19   Temp:      TempSrc:      SpO2: 99% 99% 99% 99%  Weight:  92 kg    Height:        Intake/Output Summary (Last 24 hours) at 10/19/2018 0807 Last data filed at 10/18/2018 2000 Gross per 24 hour  Intake 1097.49 ml  Output 1025 ml  Net 72.49 ml   Filed Weights   10/17/18 0427 10/18/18 0409 10/19/18 0500  Weight: 89 kg 88.3 kg 92 kg    Examination:   General: deconditioned Neurology: Awake and alert, non focal  E ENT: mild pallor, no icterus, oral mucosa moist Cardiovascular: No JVD. S1-S2 present, rhythmic, no gallops, rubs, or murmurs. No lower extremity edema. Pulmonary: positive breath sounds bilaterally,=, decreased breath sounds at bases, with no wheezing, rhonchi or rales. Gastrointestinal. Abdomen protuberant, distended and timpanic, no organomegaly, non tender, no rebound or guarding Skin. No rashes Musculoskeletal: no joint deformities     Data Reviewed: I have personally reviewed following labs and imaging studies  CBC: Recent Labs  Lab 10/15/18 0737 10/16/18 0450 10/17/18 1038 10/18/18 0345 10/19/18 0315  WBC 10.0 8.4 8.1 6.8 10.3  NEUTROABS  --   --   --   --  7.2  HGB 8.2* 7.6* 7.6* 7.8* 8.2*  HCT 27.7* 25.8* 25.6* 27.0* 27.9*  MCV 82.7 82.7 82.1 82.3 82.5  PLT 358 363 353 382 595   Basic Metabolic Panel: Recent Labs  Lab 10/15/18 0737 10/16/18 0450 10/17/18 0427 10/17/18 1038 10/18/18 0345 10/19/18 0315  NA 139 145  --  143 143 141  K 3.3* 3.7  --  3.5 3.2* 4.2  CL 112* 116*  --  114* 112* 111  CO2 17* 17*  --  24 27 26   GLUCOSE 91 60*  --  122* 106* 97  BUN 42* 27*  --  12 7 6   CREATININE 0.87 0.61  --  0.39* 0.36* 0.31*  CALCIUM 7.7* 8.1*  --  7.5* 7.5* 7.6*  MG 1.3* 1.6* 1.6*  --  1.6* 1.5*  PHOS 3.1 2.5  --   --  2.2*  --    GFR: Estimated Creatinine Clearance: 118.6 mL/min (A) (by C-G formula based on SCr of  0.31 mg/dL (L)). Liver Function Tests: Recent Labs  Lab 10/14/18 0150 10/15/18 0737 10/16/18 0450  AST 21 16 15   ALT 23 18 16   ALKPHOS 70 58 50  BILITOT 0.6 0.5 0.8  PROT 7.0 5.4* 5.2*  ALBUMIN 2.9* 2.1* 2.1*   Recent Labs  Lab 10/14/18 0150  LIPASE 36   No results for input(s): AMMONIA in the last 168 hours. Coagulation Profile: No results for input(s): INR, PROTIME in the last 168 hours. Cardiac Enzymes: No results for input(s): CKTOTAL, CKMB, CKMBINDEX, TROPONINI in the last 168 hours. BNP (last 3 results) No results for input(s): PROBNP  in the last 8760 hours. HbA1C: No results for input(s): HGBA1C in the last 72 hours. CBG: Recent Labs  Lab 10/18/18 1149 10/18/18 1549 10/18/18 1941 10/18/18 2345 10/19/18 0318  GLUCAP 85 89 96 85 88   Lipid Profile: No results for input(s): CHOL, HDL, LDLCALC, TRIG, CHOLHDL, LDLDIRECT in the last 72 hours. Thyroid Function Tests: No results for input(s): TSH, T4TOTAL, FREET4, T3FREE, THYROIDAB in the last 72 hours. Anemia Panel: No results for input(s): VITAMINB12, FOLATE, FERRITIN, TIBC, IRON, RETICCTPCT in the last 72 hours.    Radiology Studies: I have reviewed all of the imaging during this hospital visit personally     Scheduled Meds:  baclofen  20 mg Oral QID   chlorhexidine  15 mL Mouth Rinse BID   Chlorhexidine Gluconate Cloth  6 each Topical Daily   enoxaparin (LOVENOX) injection  40 mg Subcutaneous Q24H   fesoterodine  8 mg Oral Daily   mouth rinse  15 mL Mouth Rinse q12n4p   multivitamin with minerals  1 tablet Oral q morning - 10a   pantoprazole  40 mg Oral BID   vitamin C  500 mg Oral Daily   zinc sulfate  220 mg Oral Daily   Continuous Infusions:  dextrose 5 % and 0.9% NaCl 50 mL/hr at 10/19/18 0543   piperacillin-tazobactam (ZOSYN)  IV 3.375 g (10/19/18 0543)     LOS: 5 days        Stephen Baruch Gerome Apley, MD

## 2018-10-20 LAB — CBC WITH DIFFERENTIAL/PLATELET
Abs Immature Granulocytes: 0.04 10*3/uL (ref 0.00–0.07)
Basophils Absolute: 0 10*3/uL (ref 0.0–0.1)
Basophils Relative: 0 %
Eosinophils Absolute: 0.8 10*3/uL — ABNORMAL HIGH (ref 0.0–0.5)
Eosinophils Relative: 10 %
HCT: 25.8 % — ABNORMAL LOW (ref 39.0–52.0)
Hemoglobin: 7.4 g/dL — ABNORMAL LOW (ref 13.0–17.0)
Immature Granulocytes: 1 %
Lymphocytes Relative: 23 %
Lymphs Abs: 1.9 10*3/uL (ref 0.7–4.0)
MCH: 23.7 pg — ABNORMAL LOW (ref 26.0–34.0)
MCHC: 28.7 g/dL — ABNORMAL LOW (ref 30.0–36.0)
MCV: 82.7 fL (ref 80.0–100.0)
Monocytes Absolute: 0.8 10*3/uL (ref 0.1–1.0)
Monocytes Relative: 10 %
Neutro Abs: 4.7 10*3/uL (ref 1.7–7.7)
Neutrophils Relative %: 56 %
Platelets: 364 10*3/uL (ref 150–400)
RBC: 3.12 MIL/uL — ABNORMAL LOW (ref 4.22–5.81)
RDW: 18.6 % — ABNORMAL HIGH (ref 11.5–15.5)
WBC: 8.2 10*3/uL (ref 4.0–10.5)
nRBC: 0 % (ref 0.0–0.2)

## 2018-10-20 LAB — BASIC METABOLIC PANEL
Anion gap: 3 — ABNORMAL LOW (ref 5–15)
BUN: 9 mg/dL (ref 6–20)
CO2: 27 mmol/L (ref 22–32)
Calcium: 7.3 mg/dL — ABNORMAL LOW (ref 8.9–10.3)
Chloride: 107 mmol/L (ref 98–111)
Creatinine, Ser: 0.36 mg/dL — ABNORMAL LOW (ref 0.61–1.24)
GFR calc Af Amer: >60 mL/min (ref >60)
GFR calc non Af Amer: >60 mL/min (ref >60)
Glucose, Bld: 86 mg/dL (ref 70–99)
Potassium: 4 mmol/L (ref 3.5–5.1)
Sodium: 137 mmol/L (ref 135–145)

## 2018-10-20 LAB — MAGNESIUM: Magnesium: 1.5 mg/dL — ABNORMAL LOW (ref 1.7–2.4)

## 2018-10-20 MED ORDER — AMOXICILLIN-POT CLAVULANATE 875-125 MG PO TABS: 1.0000 | ORAL_TABLET | Freq: Two times a day (BID) | ORAL | 0 refills | Status: AC

## 2018-10-20 MED ORDER — MAGNESIUM SULFATE 2 GM/50ML IV SOLN
2.0000 g | Freq: Once | INTRAVENOUS | Status: AC
  Administered 2018-10-20: 2 g via INTRAVENOUS
  Filled 2018-10-20: qty 50

## 2018-10-20 MED ORDER — AMOXICILLIN-POT CLAVULANATE 875-125 MG PO TABS
1.0000 | ORAL_TABLET | Freq: Two times a day (BID) | ORAL | Status: DC
  Administered 2018-10-20: 1 via ORAL
  Filled 2018-10-20: qty 1

## 2018-10-20 MED ORDER — HEPARIN SOD (PORK) LOCK FLUSH 100 UNIT/ML IV SOLN
500.0000 [IU] | INTRAVENOUS | Status: AC | PRN
  Administered 2018-10-20: 500 [IU]

## 2018-10-20 NOTE — Discharge Summary (Addendum)
Physician Discharge Summary  [name] [name] EGB:[id number] DOB: 1965-08-23 DOA: 10/13/2018  PCP: Andria Frames, PA-C  Admit date: 10/13/2018 Discharge date: 10/20/2018  Admitted From: Home Disposition:  Home   Recommendations for Outpatient Follow-up and new medication changes:  1. Follow up with Park Meo T PA-C in 7 days.  2. Continue antibiotic therapy with Augmentin for 2 more days.  3. Continue local wound care to decubitus ulcer.   Home Health: yes   Equipment/Devices: no    Discharge Condition: stable  CODE STATUS: full  Diet recommendation: heart healthy   Brief/Interim Summary: 53 year old male who presented with vomiting. He does have significant past medical history for quadriplegia and gastroparesis. He had exploratory laparotomy and colostomy placement for necrotic bowel May 2020 at [facility]. Apparently he did have ileus and gastroparesis post procedure. On this occasion he had symptoms for about 24 hours, associated with dyspnea. On his initial physical examination his blood pressure was 89/52, heart rate 92, temperature 98.3, oxygen saturation 99% respiratory rate 15.He had decreased breath sounds, with scattered rhonchi but no wheezing bilaterally, heart S1-S2 present rhythmic, his abdomen was distended with decreased bowel sounds, colostomy in place, positive dependent lower extremity edema, stage IV sacral wounds.  Sodium 133, potassium 4.4, chloride 101, bicarb 17, glucose 112, BUN 60, creatinine 1.91, white count 13.9, hemoglobin 10.6, hematocrit 36.1, platelets 456. Urine analysis 21-50 white cells.  His chest radiograph had a right lower lobe infiltrate, CT of the abdomen showed diffuse small bowel dilatation with transition point in the distal ileum, suspicious for distal small bowel obstruction. Increased right lower lobe airspace opacity, stable appearance of large decubitus ulcers with chronic osteomyelitis,involving the hips and  pelvis.  EKG 114 bpm, left axis deviation, left anterior fascicular block, sinus rhythm, no significant ST segment or T wave changes.  Patient was admitted to the hospital working diagnosis of small bowel obstruction,complicated by right lower lobe aspiration pneumonia and sepsis. Present on admission.  1.  Small bowel obstruction.  Patient was admitted to the progressive care unit, he was kept nothing by mouth, received IV fluids along with as needed antiemetics.  Nasogastric tube was inserted and connected to low intermittent suction.  Surgery was consulted, with recommendations for conservative nonsurgical treatment.  Patient responded well to medical therapy, his nasogastric tube was removed and his diet was advanced.  Positive bowel movements and flatus.  His abdominal pain has improved along with his oral intake.  No further nausea or vomiting.  2.  Acute on chronic hypoxic respiratory failure due to right lower lobe aspiration pneumonia, complicated with septic shock, present on admission.  Patient received IV fluids and required vasopressor therapy with norepinephrine.  Antibiotic therapy with IV Zosyn with good toleration.  Patient responded well to medical therapy, vasopressors were discontinued, patient remained afebrile, with no further increased oxygen requirements.  Patient will continue antibiotic therapy with Augmentin for 2 more days.  His oxygen saturation at discharge is 96% on room air.  3.  Neurogenic bladder with chronic suprapubic catheter.  No clinical signs of urinary retention, his urinary culture came back positive for VRE, at this point considering patient's condition (quadriplegia, neurogenic bladder, chronic suprapubic catheter) it is likely a contaminant.  Recommend close follow-up as an outpatient.  4.  Non-gap metabolic acidosis with hypoglycemia, hypokalemia and hypomagnesemia.  Likely related to small bowel obstruction, patient received IV fluids, dextrose, potassium  chloride and magnesium sulfate,  with improvement of his electrolytes.  At  discharge his sodium is 137, potassium 4.0, bicarb 27, creatinine 0.36.  His magnesium is 1.5, and he will receive 2 g of magnesium sulfate before discharge.  5.  Chronic multifactorial anemia with iron deficiency.  No signs of active bleeding, his hemoglobin has ranged between 7.4 and 8.2, no indication for PRBC transfusion, close follow-up as an outpatient. Continue iron supplementation.   6.  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  Present on admission.  Patient was evaluated by wound care team, he received local wound care therapy, recommend close follow-up as an outpatient.  Discharge Diagnoses:  Principal Problem:   Sepsis ([place]) Active Problems:   Quadriplegia ([place])   Sacral decubitus ulcer, stage IV ([place])   Hypokalemia   Right lower lobe pneumonia ([place])   Hypoglycemia    Discharge Instructions   Allergies as of 10/20/2018      Reactions   Ferumoxytol Other (See Comments), Anaphylaxis   SYNCOPE SYNCOPE; patient tolerated venofer 10/01/18 s rxn   Oxybutynin Other (See Comments)   Hallucinations  Other reaction(s): Other (See Comments)   Vancomycin Other (See Comments)   ARF 05-2016 -- affects kidneys      Medication List    STOP taking these medications   erythromycin 250 MG tablet Commonly known as:  E-MYCIN     TAKE these medications   amoxicillin-clavulanate 875-125 MG tablet Commonly known as:  AUGMENTIN Take 1 tablet by mouth every 12 (twelve) hours for 2 days.   baclofen 20 MG tablet Commonly known as:  LIORESAL Take 20 mg by mouth 4 (four) times daily.   cholestyramine 4 g packet Commonly known as:  QUESTRAN Take 4 g by mouth 2 (two) times a day.   ferrous sulfate 325 (65 FE) MG tablet TAKE 1 TABLET (325 MG TOTAL) BY MOUTH 3 (THREE) TIMES DAILY WITH MEALS. What changed:  See the new instructions.   metoCLOPramide 10 MG  tablet Commonly known as:  REGLAN Take 10 mg by mouth every 4 (four) hours as needed for nausea or vomiting.   multivitamin with minerals Tabs tablet Take 1 tablet by mouth every morning.   ondansetron 8 MG disintegrating tablet Commonly known as:  Zofran ODT Take 1 tablet (8 mg total) by mouth every 8 (eight) hours as needed for nausea or vomiting.   pantoprazole 40 MG tablet Commonly known as:  PROTONIX Take 40 mg by mouth 2 (two) times daily.   sucralfate 1 g tablet Commonly known as:  CARAFATE Take 1 tablet (1 g total) by mouth 4 (four) times daily.   Toviaz 8 MG Tb24 tablet Generic drug:  fesoterodine Take 8 mg by mouth daily.   vitamin C 500 MG tablet Commonly known as:  ASCORBIC ACID Take 500 mg by mouth daily.   Zinc 50 MG Tabs Take 50 mg by mouth 2 (two) times daily.       Allergies  Allergen Reactions  . Ferumoxytol Other (See Comments) and Anaphylaxis    SYNCOPE SYNCOPE; patient tolerated venofer 10/01/18 s rxn   . Oxybutynin Other (See Comments)    Hallucinations  Other reaction(s): Other (See Comments)  . Vancomycin Other (See Comments)    ARF 05-2016 -- affects kidneys     Consultations:  Surgery   Wound care team.    Procedures/Studies: Ct Abdomen Pelvis Wo Contrast  Result Date: 10/14/2018 CLINICAL DATA:  Abdominal pain, distention, and vomiting beginning yesterday. Previous bowel obstructions. EXAM: CT ABDOMEN AND PELVIS WITHOUT CONTRAST TECHNIQUE: Multidetector  CT imaging of the abdomen and pelvis was performed following the standard protocol without IV contrast. COMPARISON:  09/20/2018 FINDINGS: Lower chest: Increased airspace opacity is seen in the right lower lobe, suspicious for pneumonia. Left lower lobe pleural-parenchymal scarring shows no significant change. Hepatobiliary: No mass visualized on this unenhanced exam. Prior cholecystectomy. No evidence of biliary obstruction. Pancreas: No mass or inflammatory process visualized on this  unenhanced exam. Spleen:  Within normal limits in size. Adrenals/Urinary tract: No evidence of urolithiasis or hydronephrosis. A suprapubic catheter is seen in the urinary bladder which is nearly empty. Stomach/Bowel: Left lower quadrant colostomy. There has been resolution of free intraperitoneal air since previous study. Moderate dilatation of stomach and small bowel is seen with transition point in the right lower quadrant in the area of the distal ileum, which may be due to adhesion. No mass or inflammatory process identified. No evidence of abscess or free fluid. No evidence of pneumatosis. Vascular/Lymphatic: No pathologically enlarged lymph nodes identified. No evidence of abdominal aortic aneurysm. Reproductive:  No mass or other significant abnormality. Other:  None. Musculoskeletal: Stable appearance of large decubitus ulcers with chronic osteomyelitis involving the hips and bony pelvis. IMPRESSION: 1. Diffuse small bowel dilatation with transition point in the distal ileum, suspicious for distal small bowel obstruction. This may be due to an adhesion; no obstructing mass or inflammatory process identified. 2. Resolution of free intraperitoneal air since previous study. 3. Increased right lower lobe airspace opacity, suspicious for pneumonia or aspiration. 4. 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 Electronically Signed   By: Earle Gell M.D.   On: 10/14/2018 03:39   Dg Chest Port 1 View  Result Date: 10/18/2018 CLINICAL DATA:  Shortness of breath EXAM: PORTABLE CHEST 1 VIEW COMPARISON:  10/16/2018 FINDINGS: Cardiac shadow is enlarged in size. Right-sided chest wall port is noted. Gastric catheter has been removed in the interval. Bibasilar atelectasis is noted with evidence of small effusions. The overall appearance is similar to that seen on the prior exam. No new focal bony abnormality is noted. IMPRESSION: Stable bibasilar atelectasis  with small effusions. Electronically Signed   By: Inez Catalina M.D.   On: 10/18/2018 07:58   Dg Chest Port 1 View  Result Date: 10/16/2018 CLINICAL DATA:  Acute respiratory failure EXAM: PORTABLE CHEST 1 VIEW COMPARISON:  10/15/2018 FINDINGS: Cardiac shadow remains enlarged. Gastric catheter is noted within the stomach. Right chest wall port is seen. Increasing bibasilar opacification is noted likely related to atelectasis. No pneumothorax is seen. No acute bony abnormality is noted. IMPRESSION: Increasing bibasilar atelectasis. Electronically Signed   By: Inez Catalina M.D.   On: 10/16/2018 07:51   Dg Chest Port 1 View  Result Date: 10/15/2018 CLINICAL DATA:  53 year old male with shortness of breath. Small bowel obstruction. Right lower lobe pneumonia. EXAM: PORTABLE CHEST 1 VIEW COMPARISON:  CT Abdomen and Pelvis 10/14/2018 and earlier. FINDINGS: Portable AP semi upright view at 0420 hours. Stable right chest power port, accessed. Enteric tube has been placed and courses to the abdomen, tip not included. Mildly increased veiling opacity at both lung bases. Otherwise stable lungs. Stable cardiac size and mediastinal contours. Visualized tracheal air column is within normal limits. IMPRESSION: Mildly increased opacity at both lung bases since yesterday might reflect increased pneumonia, superimposed atelectasis or small pleural effusions. Electronically Signed   By: Genevie Ann M.D.   On: 10/15/2018 06:22   Dg Chest Port 1 View  Result Date: 10/14/2018  CLINICAL DATA:  Vomiting EXAM: PORTABLE CHEST 1 VIEW COMPARISON:  10/07/2018, 09/29/2018 FINDINGS: Right-sided central venous port tip over the SVC. Patchy atelectasis at the right base. No consolidation or effusion. Stable cardiomediastinal silhouette. No pneumothorax. Wire cerclage above the thoracic inlet. IMPRESSION: Patchy atelectasis at the right base. Electronically Signed   By: Donavan Foil M.D.   On: 10/14/2018 02:39   Dg Abd Portable 1v  Result  Date: 10/18/2018 CLINICAL DATA:  Follow up small bowel obstruction EXAM: PORTABLE ABDOMEN - 1 VIEW COMPARISON:  10/16/2018 FINDINGS: The previously administered contrast has now cleared from the colon. No free air is noted. No obstructive changes are seen. Chronic changes about the hip joints are again noted and stable. IMPRESSION: No acute abnormality noted. Previously seen contrast has cleared from the colon. No obstructive changes are noted. Electronically Signed   By: Inez Catalina M.D.   On: 10/18/2018 07:57   Dg Abd Portable 1v  Result Date: 10/16/2018 CLINICAL DATA:  Follow up small bowel obstruction EXAM: PORTABLE ABDOMEN - 1 VIEW COMPARISON:  10/15/2018 FINDINGS: Previously administered contrast material now lies almost entirely within the colon with only a small amount within the distal ileum. No free air is seen. Gastric catheter is noted within the stomach. Chronic changes in the hip joints are noted IMPRESSION: Previously administered contrast now lies almost entirely within the colon. Electronically Signed   By: Inez Catalina M.D.   On: 10/16/2018 07:53   Dg Abd Portable 1v-small Bowel Obstruction Protocol-initial, 8 Hr Delay  Result Date: 10/16/2018 CLINICAL DATA:  Small-bowel obstruction EXAM: PORTABLE ABDOMEN - 1 VIEW COMPARISON:  10/15/2018 FINDINGS: There are persistently dilated loops of small bowel scattered throughout the abdomen. The majority of the oral contrast remains within small bowel loops in the right abdomen. There is some oral contrast in the ascending colon. There is a large amount of stool in the rectum and descending colon. The enteric tube is partially visualized. Chronic changes are noted of the pelvic bones. IMPRESSION: 1. The majority of the oral contrast remains within the small bowel. There are some persistently dilated loops of small bowel scattered throughout the abdomen. 2. Oral contrast is seen within the ascending colon. 3. Large amount of stool throughout the rectum.  Electronically Signed   By: Constance Holster M.D.   On: 10/16/2018 00:18   Dg Abd Portable 1v-small Bowel Protocol-position Verification  Result Date: 10/15/2018 CLINICAL DATA:  NG tube placed EXAM: PORTABLE ABDOMEN - 1 VIEW COMPARISON:  None. FINDINGS: Nasogastric tube with the tip projecting over the antrum of the stomach. There is mild gaseous distension of the small bowel and colon. There is no evidence of pneumoperitoneum, portal venous gas or pneumatosis. There are no pathologic calcifications along the expected course of the ureters. The osseous structures are unremarkable. IMPRESSION: Nasogastric tube with the tip projecting over the antrum of the stomach. Electronically Signed   By: Kathreen Devoid   On: 10/15/2018 13:13   Dg Abd Portable 1v  Result Date: 10/15/2018 CLINICAL DATA:  53 year old male with shortness of breath. Small bowel obstruction. Right lower lobe pneumonia. EXAM: PORTABLE ABDOMEN - 1 VIEW COMPARISON:  Portable chest today and CT Abdomen and Pelvis yesterday. FINDINGS: Portable AP view at 0420 hours. Enteric tube now in place and courses from the distal esophagus through the stomach and into the right abdomen compatible with tip placement at the junction of the 2nd and 3rd portions of the duodenum. Side hole placement is in the 2nd portion  of the duodenum. Persistent dilated small bowel loops up to 40 millimeters. Stable gas pattern. Stable severe osseous abnormality at both hips. IMPRESSION: 1. Enteric tube placed into the stomach and terminating in the duodenum, side hole at the 2nd portions of the duodenum. 2. Persistent small bowel obstruction gas pattern. Electronically Signed   By: Genevie Ann M.D.   On: 10/15/2018 06:24   Dg Abd Portable 1v  Result Date: 10/14/2018 CLINICAL DATA:  NG tube placement. EXAM: PORTABLE ABDOMEN - 1 VIEW COMPARISON:  Single-view of the abdomen earlier today. FINDINGS: NG tube has been repositioned. The tip is now in the distal stomach. IMPRESSION: As  above. Electronically Signed   By: Inge Rise M.D.   On: 10/14/2018 14:02   Dg Abd Portable 1v  Result Date: 10/14/2018 CLINICAL DATA:  NG tube placement. EXAM: PORTABLE ABDOMEN - 1 VIEW COMPARISON:  None. FINDINGS: NG tube is looped at the gastroesophageal junction with the tip projecting retrograde approximately 8 cm in the distal esophagus. IMPRESSION: As above. Electronically Signed   By: Inge Rise M.D.   On: 10/14/2018 11:04      Procedures:   Subjective: Patient is feeling better, no further nausea or vomiting, no chest pain or dyspnea.   Discharge Exam: Vitals:   10/19/18 2035 10/20/18 0457  BP: 96/69 104/63  Pulse: 83 80  Resp: 20 20  Temp: 99 F (37.2 C) 98.4 F (36.9 C)  SpO2: 96% 96%   Vitals:   10/19/18 1700 10/19/18 1844 10/19/18 2035 10/20/18 0457  BP: (!) 97/48 123/67 96/69 104/63  Pulse: 75 86 83 80  Resp: 12  20 20   Temp:  99.4 F (37.4 C) 99 F (37.2 C) 98.4 F (36.9 C)  TempSrc:  Oral Oral Oral  SpO2: 97% 95% 96% 96%  Weight:    89.5 kg  Height:        General: Not in pain or dyspnea.   Neurology: Awake and alert, non focal  E ENT: no pallor, no icterus, oral mucosa moist Cardiovascular: No JVD. S1-S2 present, rhythmic, no gallops, rubs, or murmurs. No lower extremity edema. Pulmonary: positive breath sounds bilaterally, decreased air movement, no wheezing, rhonchi or rales. Decreased breath sounds at the dependet zones.  Gastrointestinal. Abdomen protuberant with no organomegaly, non tender, no rebound or guarding/ colostomy bag in place with stool noted. Positive suprapubic catheter. Dressing on the left lower quadrant/ skin fold.  Skin. Stage IV decubitus ulcer.  Musculoskeletal: contracted upper and lower extremities. Brace in place on his upper extremities.    The results of significant diagnostics from this hospitalization (including imaging, microbiology, ancillary and laboratory) are listed below for reference.      Microbiology: Recent Results (from the past 240 hour(s))  Urine culture     Status: Abnormal   Collection Time: 10/14/18  1:50 AM  Result Value Ref Range Status   Specimen Description   Final    URINE, RANDOM Performed at [facility], [address] [place], [place] [address]    Special Requests   Final    NONE Performed at [facility], [place] [address] [place],  [address]    Culture (A)  Final    >=100,000 COLONIES/mL VANCOMYCIN RESISTANT ENTEROCOCCUS ISOLATED   Report Status 10/16/2018 FINAL  Final   Organism ID, Bacteria VANCOMYCIN RESISTANT ENTEROCOCCUS ISOLATED (A)  Final      Susceptibility   Vancomycin resistant enterococcus isolated - MIC*    AMPICILLIN >=32 RESISTANT Resistant  LEVOFLOXACIN >=8 RESISTANT Resistant     NITROFURANTOIN 32 SENSITIVE Sensitive     VANCOMYCIN >=32 RESISTANT Resistant     LINEZOLID 2 SENSITIVE Sensitive     * >=100,000 COLONIES/mL VANCOMYCIN RESISTANT ENTEROCOCCUS ISOLATED  SARS Coronavirus 2 (CEPHEID - Performed in [place] hospital lab), Hosp Order     Status: None   Collection Time: 10/14/18  1:51 AM  Result Value Ref Range Status   SARS Coronavirus 2 NEGATIVE NEGATIVE Final    Comment: (NOTE) If result is NEGATIVE SARS-CoV-2 target nucleic acids are NOT DETECTED. The SARS-CoV-2 RNA is generally detectable in upper and lower  respiratory specimens during the acute phase of infection. The lowest  concentration of SARS-CoV-2 viral copies this assay can detect is 250  copies / mL. A negative result does not preclude SARS-CoV-2 infection  and should not be used as the sole basis for treatment or other  patient management decisions.  A negative result may occur with  improper specimen collection / handling, submission of specimen other  than nasopharyngeal swab, presence of viral mutation(s) within the  areas targeted by this assay, and inadequate number of viral copies  (<250 copies / mL). A  negative result must be combined with clinical  observations, patient history, and epidemiological information. If result is POSITIVE SARS-CoV-2 target nucleic acids are DETECTED. The SARS-CoV-2 RNA is generally detectable in upper and lower  respiratory specimens dur ing the acute phase of infection.  Positive  results are indicative of active infection with SARS-CoV-2.  Clinical  correlation with patient history and other diagnostic information is  necessary to determine patient infection status.  Positive results do  not rule out bacterial infection or co-infection with other viruses. If result is PRESUMPTIVE POSTIVE SARS-CoV-2 nucleic acids MAY BE PRESENT.   A presumptive positive result was obtained on the submitted specimen  and confirmed on repeat testing.  While 2019 novel coronavirus  (SARS-CoV-2) nucleic acids may be present in the submitted sample  additional confirmatory testing may be necessary for epidemiological  and / or clinical management purposes  to differentiate between  SARS-CoV-2 and other Sarbecovirus currently known to infect humans.  If clinically indicated additional testing with an alternate test  methodology [phone]) is advised. The SARS-CoV-2 RNA is generally  detectable in upper and lower respiratory sp ecimens during the acute  phase of infection. The expected result is Negative. Fact Sheet for Patients:  StrictlyIdeas.no Fact Sheet for Healthcare Providers: BankingDealers.co.za This test is not yet approved or cleared by the [place] FDA and has been authorized for detection and/or diagnosis of SARS-CoV-2 by FDA under an Emergency Use Authorization (EUA).  This EUA will remain in effect (meaning this test can be used) for the duration of the COVID-19 declaration under Section 564(b)(1) of the Act, 21 U.S.C. section 360bbb-3(b)(1), unless the authorization is terminated or revoked sooner. Performed  at [facility], [place] [address] [place], [place] [address]   MRSA PCR Screening     Status: Abnormal   Collection Time: 10/14/18 12:59 PM  Result Value Ref Range Status   MRSA by PCR POSITIVE (A) NEGATIVE Final    Comment:        The GeneXpert MRSA Assay (FDA approved for NASAL specimens only), is one component of a comprehensive MRSA colonization surveillance program. It is not intended to diagnose MRSA infection nor to guide or monitor treatment for MRSA infections. RESULT CALLED TO, READ BACK BY AND VERIFIED WITH: MCMILLAN,S @ 0039 ON  [id number] BY POTEAT,S Performed at [facility], [place] [address], [place], [place] [address]      Labs: BNP (last 3 results) No results for input(s): BNP in the last 8760 hours. Basic Metabolic Panel: Recent Labs  Lab 10/15/18 0737 10/16/18 0450 10/17/18 0427 10/17/18 1038 10/18/18 0345 10/19/18 0315 10/20/18 0411  NA 139 145  --  143 143 141 137  K 3.3* 3.7  --  3.5 3.2* 4.2 4.0  CL 112* 116*  --  114* 112* 111 107  CO2 17* 17*  --  24 27 26 27   GLUCOSE 91 60*  --  122* 106* 97 86  BUN 42* 27*  --  12 7 6 9   CREATININE 0.87 0.61  --  0.39* 0.36* 0.31* 0.36*  CALCIUM 7.7* 8.1*  --  7.5* 7.5* 7.6* 7.3*  MG 1.3* 1.6* 1.6*  --  1.6* 1.5* 1.5*  PHOS 3.1 2.5  --   --  2.2*  --   --    Liver Function Tests: Recent Labs  Lab 10/14/18 0150 10/15/18 0737 10/16/18 0450  AST 21 16 15   ALT 23 18 16   ALKPHOS 70 58 50  BILITOT 0.6 0.5 0.8  PROT 7.0 5.4* 5.2*  ALBUMIN 2.9* 2.1* 2.1*   Recent Labs  Lab 10/14/18 0150  LIPASE 36   No results for input(s): AMMONIA in the last 168 hours. CBC: Recent Labs  Lab 10/16/18 0450 10/17/18 1038 10/18/18 0345 10/19/18 0315 10/20/18 0411  WBC 8.4 8.1 6.8 10.3 8.2  NEUTROABS  --   --   --  7.2 4.7  HGB 7.6* 7.6* 7.8* 8.2* 7.4*  HCT 25.8* 25.6* 27.0* 27.9* 25.8*  MCV 82.7 82.1 82.3 82.5 82.7  PLT 363 353 382 362 364   Cardiac Enzymes: No results for  input(s): CKTOTAL, CKMB, CKMBINDEX, TROPONINI in the last 168 hours. BNP: Invalid input(s): POCBNP CBG: Recent Labs  Lab 10/18/18 2345 10/19/18 0318 10/19/18 0808 10/19/18 1138 10/19/18 1526  GLUCAP 85 88 90 121* 93   D-Dimer No results for input(s): DDIMER in the last 72 hours. Hgb A1c No results for input(s): HGBA1C in the last 72 hours. Lipid Profile No results for input(s): CHOL, HDL, LDLCALC, TRIG, CHOLHDL, LDLDIRECT in the last 72 hours. Thyroid function studies No results for input(s): TSH, T4TOTAL, T3FREE, THYROIDAB in the last 72 hours.  Invalid input(s): FREET3 Anemia work up No results for input(s): VITAMINB12, FOLATE, FERRITIN, TIBC, IRON, RETICCTPCT in the last 72 hours. Urinalysis    Component Value Date/Time   COLORURINE YELLOW 10/13/2018 2359   APPEARANCEUR CLOUDY (A) 10/13/2018 2359   LABSPEC 1.013 10/13/2018 2359   PHURINE 5.0 10/13/2018 2359   GLUCOSEU NEGATIVE 10/13/2018 2359   HGBUR SMALL (A) 10/13/2018 2359   BILIRUBINUR NEGATIVE 10/13/2018 2359   KETONESUR NEGATIVE 10/13/2018 2359   PROTEINUR NEGATIVE 10/13/2018 2359   UROBILINOGEN 1.0 03/16/2015 0835   NITRITE NEGATIVE 10/13/2018 2359   LEUKOCYTESUR MODERATE (A) 10/13/2018 2359   Sepsis Labs Invalid input(s): PROCALCITONIN,  WBC,  LACTICIDVEN Microbiology Recent Results (from the past 240 hour(s))  Urine culture     Status: Abnormal   Collection Time: 10/14/18  1:50 AM  Result Value Ref Range Status   Specimen Description   Final    URINE, RANDOM Performed at [facility], [place] [address] [place], [name] [address]    Special Requests   Final    NONE Performed at [facility], [address] [place],  [place] [address]    Culture (A)  Final    >=100,000 COLONIES/mL VANCOMYCIN RESISTANT ENTEROCOCCUS ISOLATED   Report Status 10/16/2018 FINAL  Final   Organism ID, Bacteria VANCOMYCIN RESISTANT ENTEROCOCCUS ISOLATED (A)  Final      Susceptibility   Vancomycin  resistant enterococcus isolated - MIC*    AMPICILLIN >=32 RESISTANT Resistant     LEVOFLOXACIN >=8 RESISTANT Resistant     NITROFURANTOIN 32 SENSITIVE Sensitive     VANCOMYCIN >=32 RESISTANT Resistant     LINEZOLID 2 SENSITIVE Sensitive     * >=100,000 COLONIES/mL VANCOMYCIN RESISTANT ENTEROCOCCUS ISOLATED  SARS Coronavirus 2 (CEPHEID - Performed in [facility] lab), Hosp Order     Status: None   Collection Time: 10/14/18  1:51 AM  Result Value Ref Range Status   SARS Coronavirus 2 NEGATIVE NEGATIVE Final    Comment: (NOTE) If result is NEGATIVE SARS-CoV-2 target nucleic acids are NOT DETECTED. The SARS-CoV-2 RNA is generally detectable in upper and lower  respiratory specimens during the acute phase of infection. The lowest  concentration of SARS-CoV-2 viral copies this assay can detect is 250  copies / mL. A negative result does not preclude SARS-CoV-2 infection  and should not be used as the sole basis for treatment or other  patient management decisions.  A negative result may occur with  improper specimen collection / handling, submission of specimen other  than nasopharyngeal swab, presence of viral mutation(s) within the  areas targeted by this assay, and inadequate number of viral copies  (<250 copies / mL). A negative result must be combined with clinical  observations, patient history, and epidemiological information. If result is POSITIVE SARS-CoV-2 target nucleic acids are DETECTED. The SARS-CoV-2 RNA is generally detectable in upper and lower  respiratory specimens dur ing the acute phase of infection.  Positive  results are indicative of active infection with SARS-CoV-2.  Clinical  correlation with patient history and other diagnostic information is  necessary to determine patient infection status.  Positive results do  not rule out bacterial infection or co-infection with other viruses. If result is PRESUMPTIVE POSTIVE SARS-CoV-2 nucleic acids MAY BE  PRESENT.   A presumptive positive result was obtained on the submitted specimen  and confirmed on repeat testing.  While 2019 novel coronavirus  (SARS-CoV-2) nucleic acids may be present in the submitted sample  additional confirmatory testing may be necessary for epidemiological  and / or clinical management purposes  to differentiate between  SARS-CoV-2 and other Sarbecovirus currently known to infect humans.  If clinically indicated additional testing with an alternate test  methodology [phone]) is advised. The SARS-CoV-2 RNA is generally  detectable in upper and lower respiratory sp ecimens during the acute  phase of infection. The expected result is Negative. Fact Sheet for Patients:  StrictlyIdeas.no Fact Sheet for Healthcare Providers: BankingDealers.co.za This test is not yet approved or cleared by the [place] FDA and has been authorized for detection and/or diagnosis of SARS-CoV-2 by FDA under an Emergency Use Authorization (EUA).  This EUA will remain in effect (meaning this test can be used) for the duration of the COVID-19 declaration under Section 564(b)(1) of the Act, 21 U.S.C. section 360bbb-3(b)(1), unless the authorization is terminated or revoked sooner. Performed at [facility], [place] [address] [place], [place] [address]   MRSA PCR Screening     Status: Abnormal   Collection Time: 10/14/18 12:59 PM  Result Value Ref Range Status   MRSA by PCR POSITIVE (A)  NEGATIVE Final    Comment:        The GeneXpert MRSA Assay (FDA approved for NASAL specimens only), is one component of a comprehensive MRSA colonization surveillance program. It is not intended to diagnose MRSA infection nor to guide or monitor treatment for MRSA infections. RESULT CALLED TO, READ BACK BY AND VERIFIED WITH: MCMILLAN,S @ 0039 ON [id number] BY POTEAT,S Performed at [facility] [address]  [place], [place] [id number]      Time coordinating discharge: 45 minutes  SIGNED:   Tawni Millers, MD  [facility] 10/20/2018, 9:45 AM

## 2018-10-20 NOTE — TOC Progression Note (Signed)
Transition of Care [facility]) - Progression Note    Patient Details  Name: [name] [name] MRN: [id number] Date of Birth: August 13, 1965  Transition of Care [facility]) CM/SW Contact  Joaquin Courts, RN Phone Number: 10/20/2018, 1:50 PM  Clinical Narrative:    CM spoke with patient regarding HHRN. Patient declines home health at this time. States his sister, who is his primary care giver is uncomfortable having anyone else in the home at this time due to Covid pandemic.    Expected Discharge Plan: Home/Self Care Barriers to Discharge: Continued Medical Work up  Expected Discharge Plan and Services Expected Discharge Plan: Home/Self Care       Living arrangements for the past 2 months: Single Family Home Expected Discharge Date: 10/20/18                                     Social Determinants of Health (SDOH) Interventions    Readmission Risk Interventions Readmission Risk Prevention Plan 10/15/2018  Transportation Screening Complete  PCP or Specialist Appt within 3-5 Days Not Complete  Not Complete comments not yet ready for d/c  HRI or [place] Complete  Social Work Consult for [place] Planning/Counseling Complete  Palliative Care Screening Not Applicable  Medication Review [employer] photographer) Complete  Some recent data might be hidden

## 2018-10-20 NOTE — Plan of Care (Signed)
Discharge instructions reviewed with patient, questions answered, verbalized understanding.  Patient awaiting [facility] transportation home where his sister will be waiting his arrival.

## 2018-10-23 IMAGING — CR DG CHEST 2V
2 series · 2 of 2 positions shown · non-contrast
Comparison: [DATE] [DATE], [DATE], [DATE] [DATE], [DATE]

CLINICAL DATA: Possible urinary tract infection

EXAM:
CHEST  2 VIEW

[w chest lat]
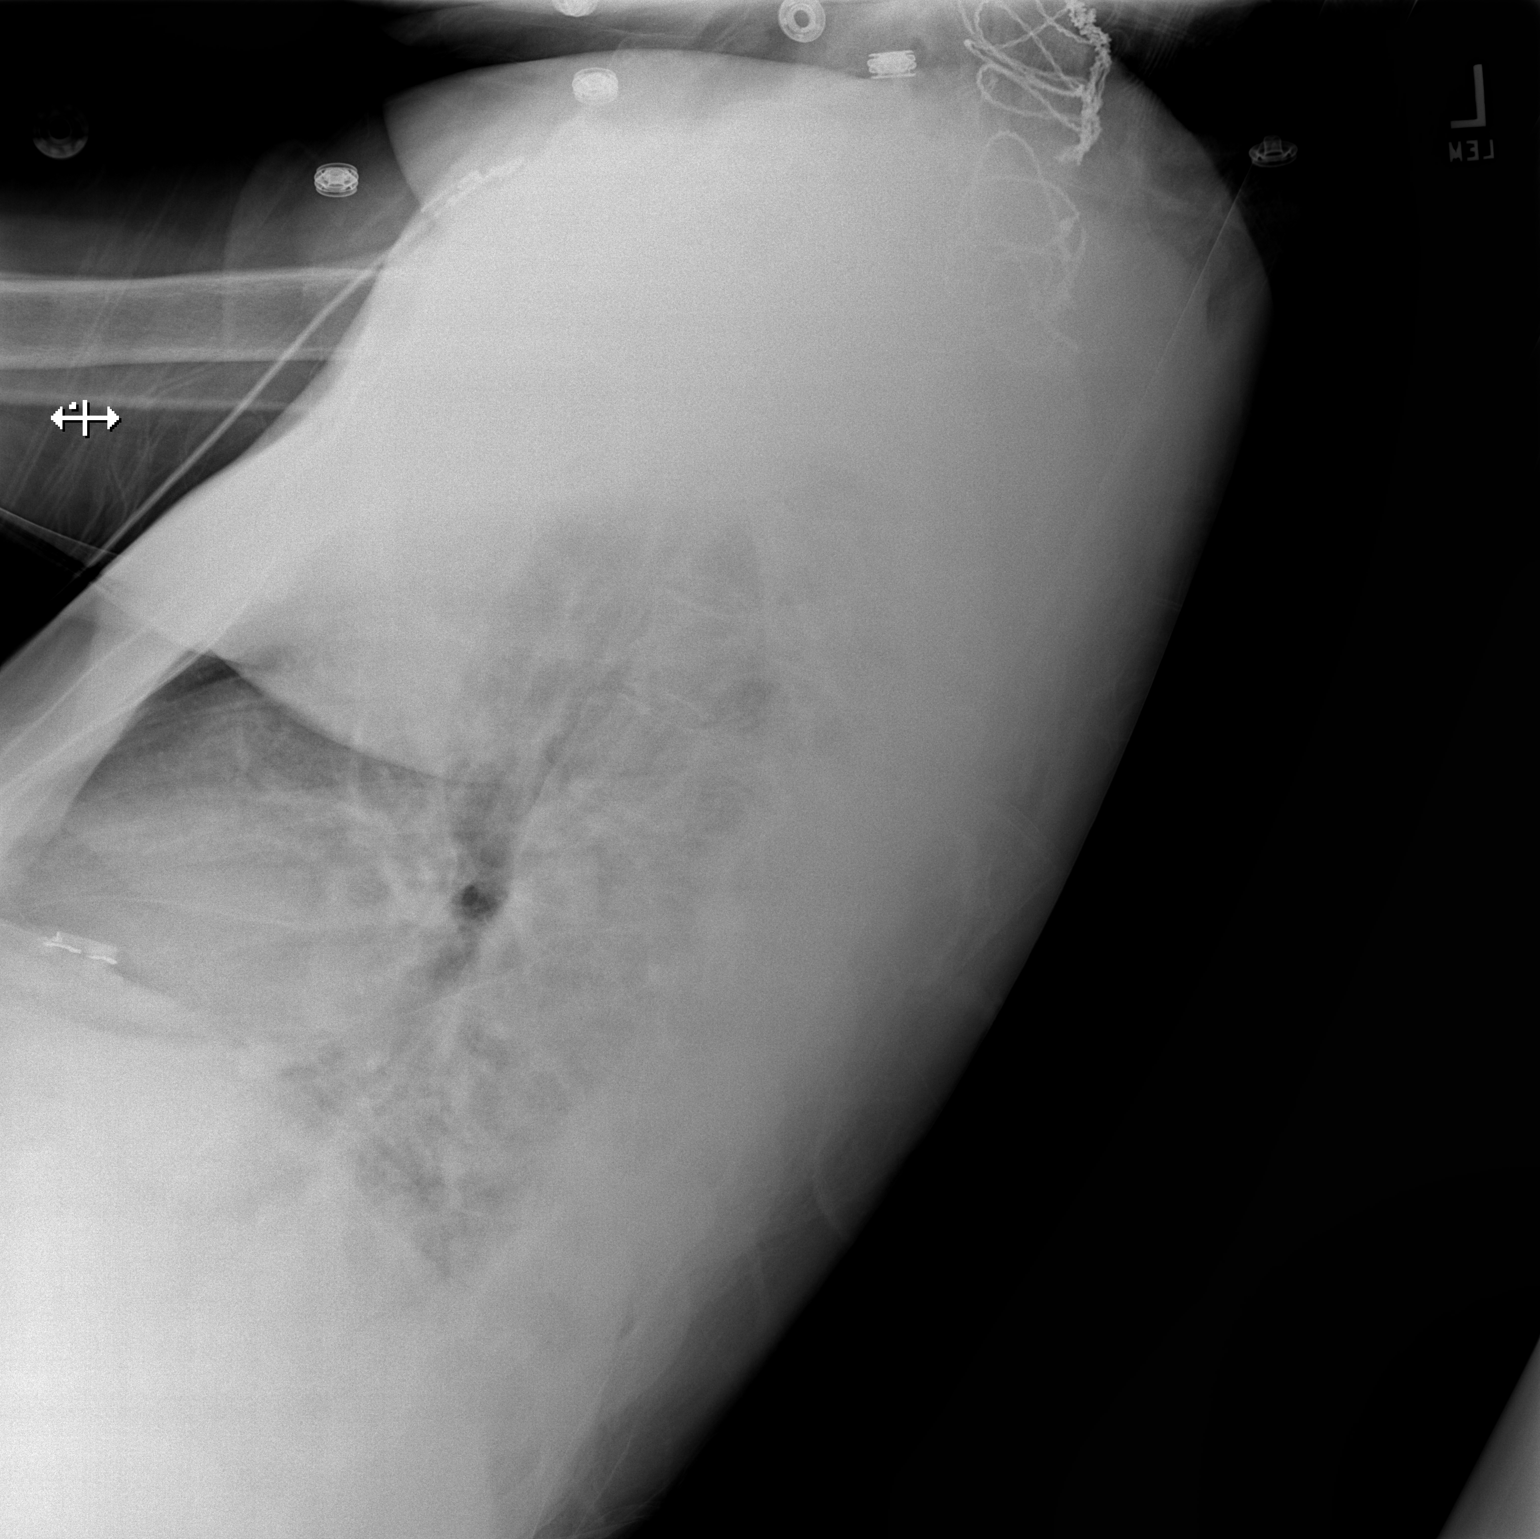

[x chest ap]
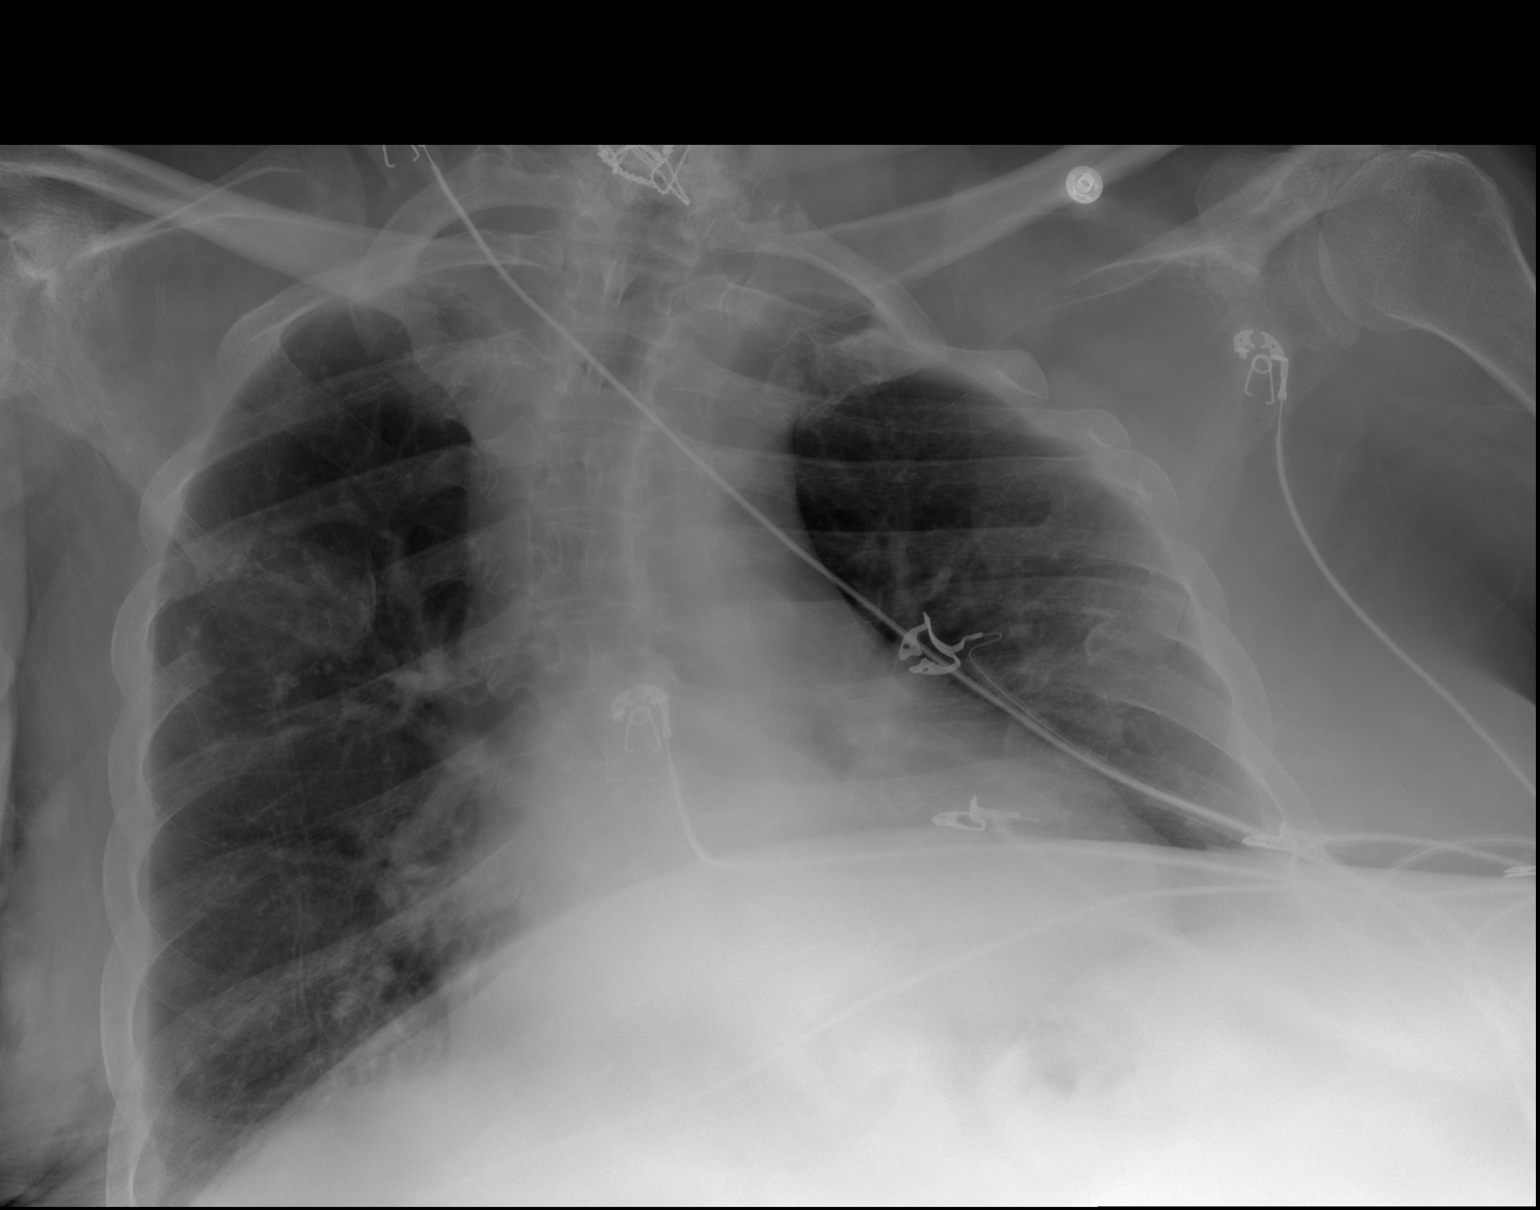

[2 of 2 positions shown; findings below may reference images not displayed]

FINDINGS: The mediastinal contour is normal. The heart size is possibly
enlarged. Both lungs are clear. Bone lesion involving the posterior
right fifth rib is unchanged.
IMPRESSION: No active cardiopulmonary disease.

## 2018-10-23 IMAGING — CT CT ABD-PELV W/ CM
2 of 5 series · 14 of 46 positions shown, 16 images · IV contrast (ISOVUE)
Comparison: CT dated 11/19/2015

CLINICAL DATA: 50-year-old male with urinary retention.

EXAM:
CT ABDOMEN AND PELVIS WITH CONTRAST
TECHNIQUE: Multidetector CT imaging of the abdomen and pelvis was performed
using the standard protocol following bolus administration of
intravenous contrast.
CONTRAST:  100 cc Isovue-XCC

[Series 2: abd/pel with · axial · 0.93mm/px · z∈[+973,+1373]mm · 11 of 92 slices shown, 13 images]
[im 6/92  soft-tissue]
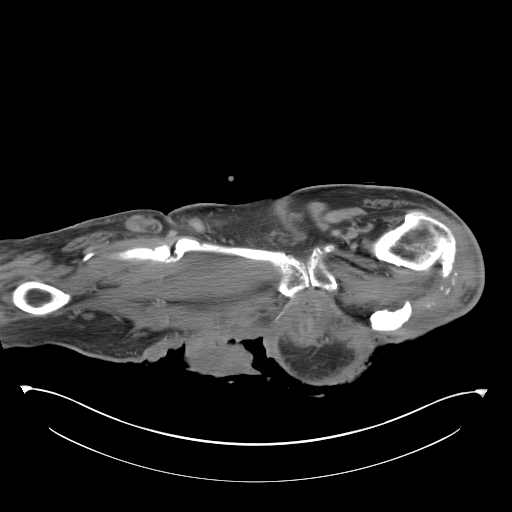
[im 6/92  bone]
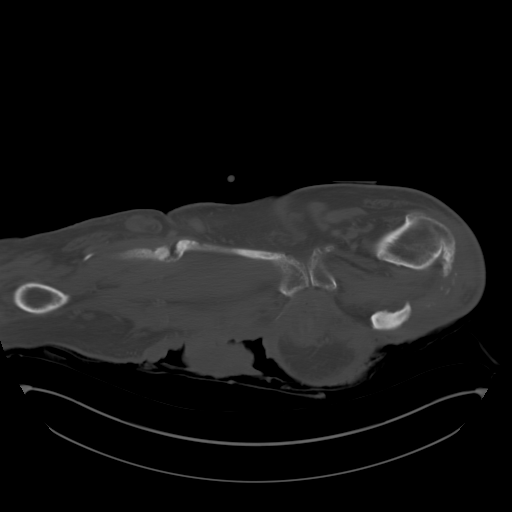
[im 17/92  soft-tissue]
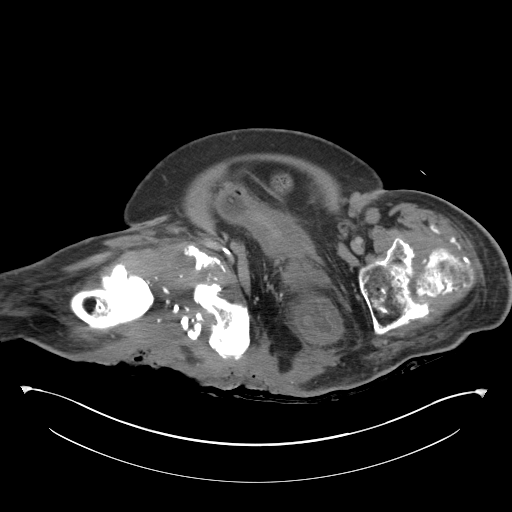
[im 22/92  soft-tissue]
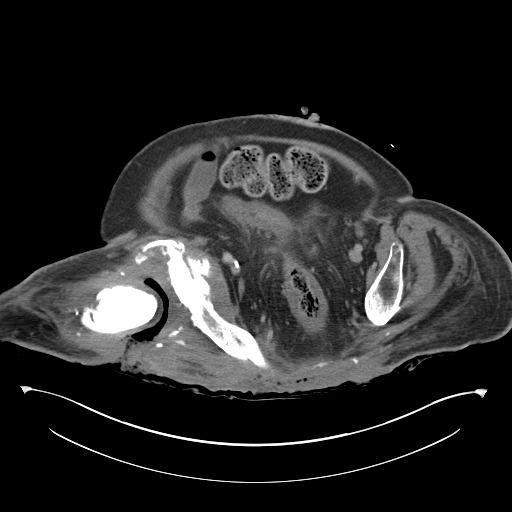
[im 33/92  soft-tissue]
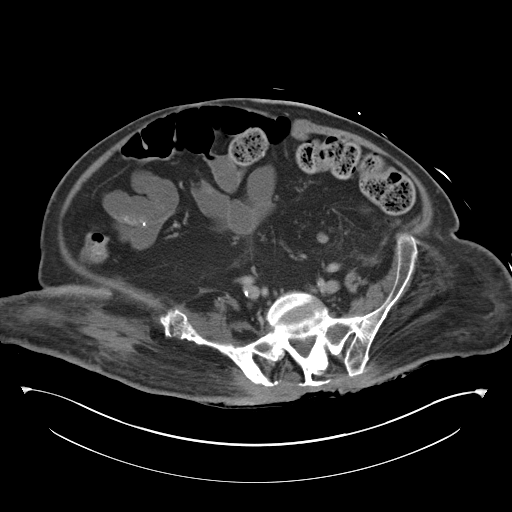
[im 38/92  soft-tissue]
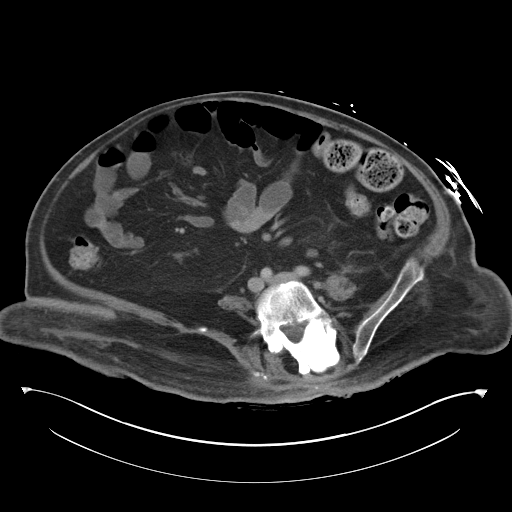
[im 49/92  soft-tissue]
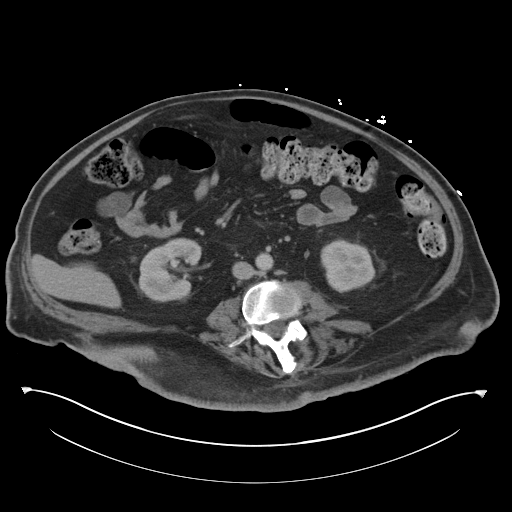
[im 54/92  soft-tissue]
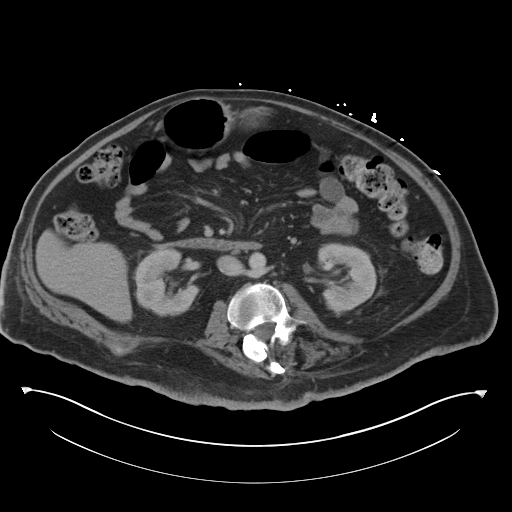
[im 59/92  soft-tissue]
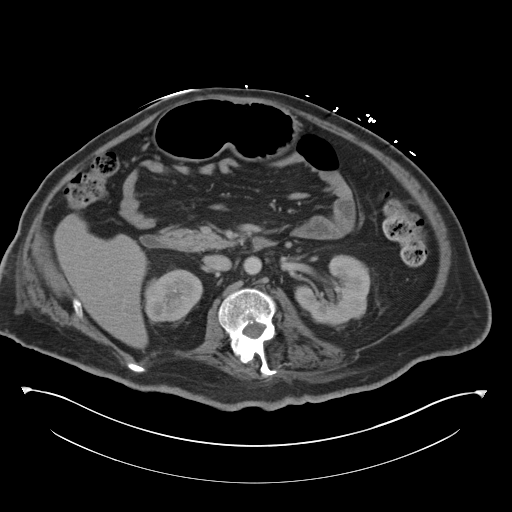
[im 70/92  soft-tissue]
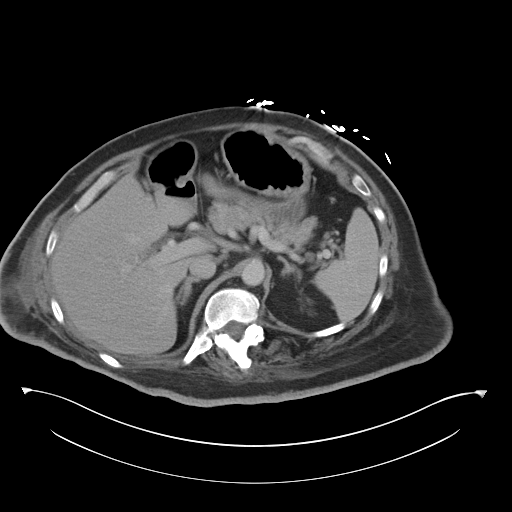
[im 70/92  bone]
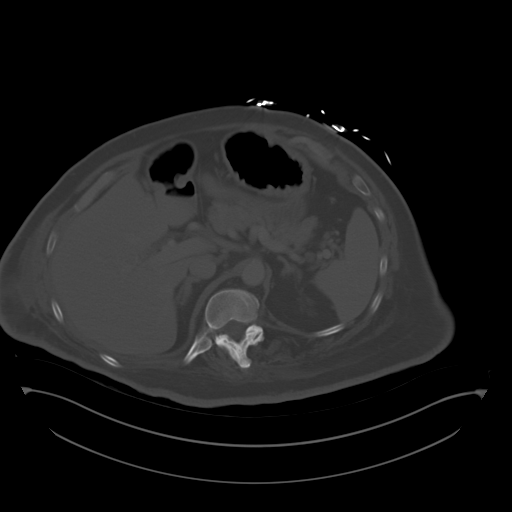
[im 75/92  soft-tissue]
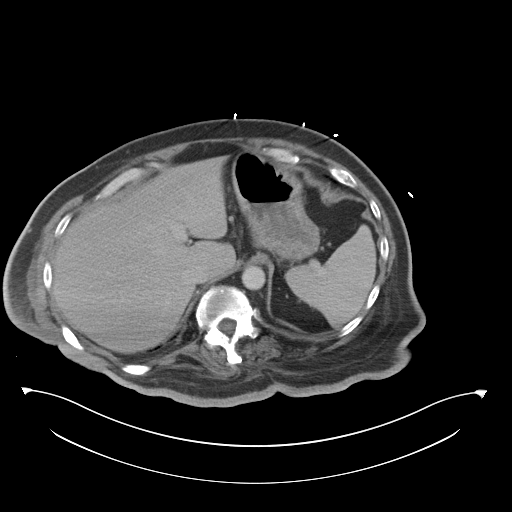
[im 86/92  soft-tissue]
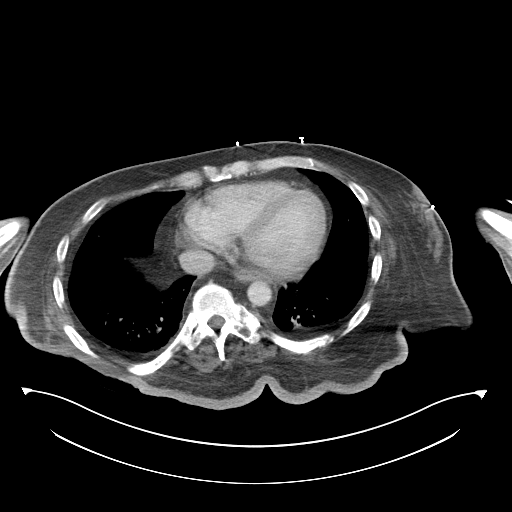

[Series 5: coronal a/|p · coronal · 0.86mm/px · 3 of 157 slices shown]
[im 53/157  soft-tissue]
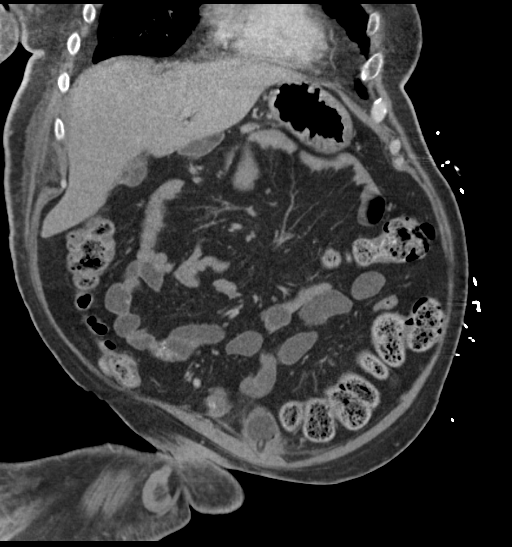
[im 70/157  soft-tissue]
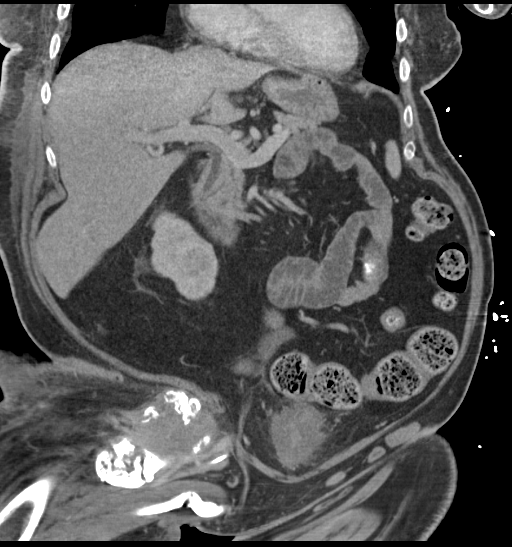
[im 87/157  soft-tissue]
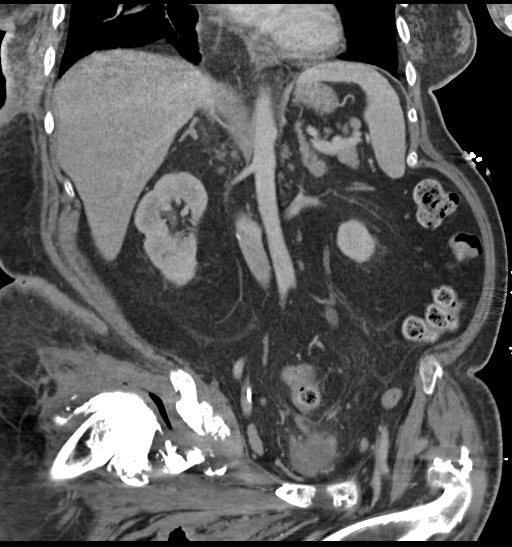

[14 of 46 positions shown; findings below may reference images not displayed]

FINDINGS: Evaluation is limited due to streak artifact caused by patient's
arms.

Lower chest: There bibasilar linear atelectasis/ scarring.

No intra-abdominal free air or free fluid.

Hepatobiliary: The liver is unremarkable. No intrahepatic biliary
ductal dilatation. There is lobulated and irregular appearance of
the gallbladder fundus similar to prior study and may be related to
underlying adenomyomatosis or areas of scarring. The common bile
neck appears unremarkable.

Pancreas: Unremarkable. No pancreatic ductal dilatation or
surrounding inflammatory changes.

Spleen: Normal in size without focal abnormality.

Adrenals/Urinary Tract: The adrenal glands appear unremarkable.
There is no hydronephrosis or nephrolithiasis on either side. There
is symmetric enhancement and excretion of contrast by kidneys.
Subcentimeter left renal hypodensity is not characterized but
appears similar to prior CT and possibly a cyst. The urinary bladder
is decompressed around a suprapubic catheter. There is diffuse
thickening and inflammatory changes of the bladder concerning for
cystitis. Correlation with urinalysis recommended. There are small
pockets of gas in the base of the bladder extending into the central
portion of the prostate gland to the base of the penis. Although
this may represent air within the urethra related to recent
instrumentation, the possibility of a Fournier gangrene is not
excluded. Correlation with clinical exam , urinalysis and culture,
and urology consult is advised. Evaluation of the pelvic area is
limited due to distortion of the anatomy.

Stomach/Bowel: There is thickened appearance of the rectum more
prominent compared to the prior CTs and possibly reactive to
inflammatory/ infectious process within the bladder. There is
moderate amount of stool within the colon. A left lower quadrant
colostomy appears unremarkable. There is no evidence of bowel
obstruction. Normal appendix.

Vascular/Lymphatic: There is mild aortoiliac atherosclerotic
disease. The origins of the celiac axis, SMA, IMA as well as the
origins of the renal arteries are patent. A right femoral venous
line with tip in the right common iliac vein noted. The IVC appears
unremarkable. No portal venous gas identified. The splenic vein,
SMV, and main portal vein are patent. A stable appearing mildly
enlarged lymph node along the left iliac chain measures 16 mm in
short axis.

Reproductive: The prostate and seminal vesicles are grossly
unremarkable.

Other: None.

Musculoskeletal: There is severe chronic deformity and heterotopic
bone formation of the hips. There is chronically dislocated lips
bilaterally with chronic surrounding soft tissue inflammation and
thickening. There is bilateral decubitus ulcer extending to the
bones and air in the right pelvic joint space. There is sclerosis of
the osseus structures related to chronic osteomyelitis. Old healed
left rib fractures. Degenerative changes of the spine and scoliosis.
No acute fracture.
IMPRESSION: Diffusely thickened and inflamed appearance of the bladder wall
compatible with cystitis. There is gas extending from the base of
the bladder into the prostatic urethra and base of the penis.
Although this may be air within the urethra soft tissue gas and
therefore a Fournier gangrene is not excluded. Correlation with
clinical exam, urinalysis and culture, and urology consult is
advised. Alternatively this gas may be within the rectal region. A
smaller pocket of gas was seen in the same region on CT dated
11/19/2015. No definite drainable fluid collection/ abscess.

Thickened and inflamed appearance of the rectum likely secondary to
inflammatory changes of the bladder. Left lower quadrant colostomy
appears similar to the prior CT and unremarkable. No bowel
obstruction. Normal appendix.

Chronic bilateral decubitus ulcer extending to the level of the
bones with os is changes of chronic osteomyelitis. No drainable
fluid collection or abscess. No acute fracture.

These results were called by telephone at the time of interpretation
on 03/10/2016 at [DATE] to Dr. Ebenezer, who verbally acknowledged
these results.

## 2018-10-24 DIAGNOSIS — K3184 Gastroparesis: Secondary | ICD-10-CM | POA: Diagnosis not present

## 2018-10-24 DIAGNOSIS — G8250 Quadriplegia, unspecified: Secondary | ICD-10-CM | POA: Diagnosis not present

## 2018-10-24 DIAGNOSIS — R197 Diarrhea, unspecified: Secondary | ICD-10-CM | POA: Diagnosis not present

## 2018-11-02 ENCOUNTER — Encounter (HOSPITAL_BASED_OUTPATIENT_CLINIC_OR_DEPARTMENT_OTHER): Payer: Medicare Other | Attending: Internal Medicine

## 2018-11-02 DIAGNOSIS — L89892 Pressure ulcer of other site, stage 2: Secondary | ICD-10-CM | POA: Insufficient documentation

## 2018-11-02 DIAGNOSIS — X58XXXA Exposure to other specified factors, initial encounter: Secondary | ICD-10-CM | POA: Diagnosis not present

## 2018-11-02 DIAGNOSIS — G8250 Quadriplegia, unspecified: Secondary | ICD-10-CM | POA: Insufficient documentation

## 2018-11-02 DIAGNOSIS — L89214 Pressure ulcer of right hip, stage 4: Secondary | ICD-10-CM | POA: Diagnosis not present

## 2018-11-02 DIAGNOSIS — L89323 Pressure ulcer of left buttock, stage 3: Secondary | ICD-10-CM | POA: Insufficient documentation

## 2018-11-02 DIAGNOSIS — S50312A Abrasion of left elbow, initial encounter: Secondary | ICD-10-CM | POA: Insufficient documentation

## 2018-11-02 DIAGNOSIS — S31809A Unspecified open wound of unspecified buttock, initial encounter: Secondary | ICD-10-CM | POA: Diagnosis not present

## 2018-11-02 DIAGNOSIS — L89224 Pressure ulcer of left hip, stage 4: Secondary | ICD-10-CM | POA: Insufficient documentation

## 2018-11-02 DIAGNOSIS — G4730 Sleep apnea, unspecified: Secondary | ICD-10-CM | POA: Diagnosis not present

## 2018-11-02 DIAGNOSIS — L89894 Pressure ulcer of other site, stage 4: Secondary | ICD-10-CM | POA: Diagnosis not present

## 2018-11-02 DIAGNOSIS — Z933 Colostomy status: Secondary | ICD-10-CM | POA: Diagnosis not present

## 2018-11-02 DIAGNOSIS — L89154 Pressure ulcer of sacral region, stage 4: Secondary | ICD-10-CM | POA: Insufficient documentation

## 2018-11-02 DIAGNOSIS — I10 Essential (primary) hypertension: Secondary | ICD-10-CM | POA: Insufficient documentation

## 2018-11-02 DIAGNOSIS — L8944 Pressure ulcer of contiguous site of back, buttock and hip, stage 4: Secondary | ICD-10-CM | POA: Diagnosis not present

## 2018-11-02 DIAGNOSIS — S31102A Unspecified open wound of abdominal wall, epigastric region without penetration into peritoneal cavity, initial encounter: Secondary | ICD-10-CM | POA: Diagnosis not present

## 2018-11-02 DIAGNOSIS — L89324 Pressure ulcer of left buttock, stage 4: Secondary | ICD-10-CM | POA: Diagnosis not present

## 2018-11-05 DIAGNOSIS — S31809A Unspecified open wound of unspecified buttock, initial encounter: Secondary | ICD-10-CM | POA: Diagnosis not present

## 2018-11-05 DIAGNOSIS — S31102A Unspecified open wound of abdominal wall, epigastric region without penetration into peritoneal cavity, initial encounter: Secondary | ICD-10-CM | POA: Diagnosis not present

## 2018-11-05 DIAGNOSIS — L8944 Pressure ulcer of contiguous site of back, buttock and hip, stage 4: Secondary | ICD-10-CM | POA: Diagnosis not present

## 2018-11-05 DIAGNOSIS — Z933 Colostomy status: Secondary | ICD-10-CM | POA: Diagnosis not present

## 2018-11-05 DIAGNOSIS — L89309 Pressure ulcer of unspecified buttock, unspecified stage: Secondary | ICD-10-CM | POA: Diagnosis not present

## 2018-11-06 DIAGNOSIS — L8944 Pressure ulcer of contiguous site of back, buttock and hip, stage 4: Secondary | ICD-10-CM | POA: Diagnosis not present

## 2018-11-06 DIAGNOSIS — S31102A Unspecified open wound of abdominal wall, epigastric region without penetration into peritoneal cavity, initial encounter: Secondary | ICD-10-CM | POA: Diagnosis not present

## 2018-11-06 DIAGNOSIS — S31809A Unspecified open wound of unspecified buttock, initial encounter: Secondary | ICD-10-CM | POA: Diagnosis not present

## 2018-11-06 DIAGNOSIS — Z933 Colostomy status: Secondary | ICD-10-CM | POA: Diagnosis not present

## 2018-11-08 DIAGNOSIS — E46 Unspecified protein-calorie malnutrition: Secondary | ICD-10-CM | POA: Diagnosis not present

## 2018-11-08 DIAGNOSIS — Z0389 Encounter for observation for other suspected diseases and conditions ruled out: Secondary | ICD-10-CM | POA: Diagnosis not present

## 2018-11-08 DIAGNOSIS — D509 Iron deficiency anemia, unspecified: Secondary | ICD-10-CM | POA: Diagnosis not present

## 2018-11-08 DIAGNOSIS — Z980 Intestinal bypass and anastomosis status: Secondary | ICD-10-CM | POA: Diagnosis not present

## 2018-11-08 DIAGNOSIS — N3000 Acute cystitis without hematuria: Secondary | ICD-10-CM | POA: Diagnosis not present

## 2018-11-08 DIAGNOSIS — K3184 Gastroparesis: Secondary | ICD-10-CM | POA: Diagnosis not present

## 2018-11-08 DIAGNOSIS — N319 Neuromuscular dysfunction of bladder, unspecified: Secondary | ICD-10-CM | POA: Diagnosis not present

## 2018-11-08 DIAGNOSIS — R5381 Other malaise: Secondary | ICD-10-CM | POA: Diagnosis not present

## 2018-11-08 DIAGNOSIS — R197 Diarrhea, unspecified: Secondary | ICD-10-CM | POA: Diagnosis not present

## 2018-11-08 DIAGNOSIS — G8220 Paraplegia, unspecified: Secondary | ICD-10-CM | POA: Diagnosis not present

## 2018-11-08 DIAGNOSIS — I959 Hypotension, unspecified: Secondary | ICD-10-CM | POA: Diagnosis not present

## 2018-11-08 DIAGNOSIS — T83510A Infection and inflammatory reaction due to cystostomy catheter, initial encounter: Secondary | ICD-10-CM | POA: Diagnosis not present

## 2018-11-08 DIAGNOSIS — G8250 Quadriplegia, unspecified: Secondary | ICD-10-CM | POA: Diagnosis not present

## 2018-11-08 DIAGNOSIS — E1143 Type 2 diabetes mellitus with diabetic autonomic (poly)neuropathy: Secondary | ICD-10-CM | POA: Diagnosis not present

## 2018-11-08 DIAGNOSIS — R9431 Abnormal electrocardiogram [ECG] [EKG]: Secondary | ICD-10-CM | POA: Diagnosis not present

## 2018-11-08 DIAGNOSIS — M2550 Pain in unspecified joint: Secondary | ICD-10-CM | POA: Diagnosis not present

## 2018-11-08 DIAGNOSIS — N390 Urinary tract infection, site not specified: Secondary | ICD-10-CM | POA: Diagnosis not present

## 2018-11-08 DIAGNOSIS — L89224 Pressure ulcer of left hip, stage 4: Secondary | ICD-10-CM | POA: Diagnosis not present

## 2018-11-08 DIAGNOSIS — D649 Anemia, unspecified: Secondary | ICD-10-CM | POA: Diagnosis not present

## 2018-11-08 DIAGNOSIS — I10 Essential (primary) hypertension: Secondary | ICD-10-CM | POA: Diagnosis not present

## 2018-11-08 DIAGNOSIS — Z7401 Bed confinement status: Secondary | ICD-10-CM | POA: Diagnosis not present

## 2018-11-08 DIAGNOSIS — Z933 Colostomy status: Secondary | ICD-10-CM | POA: Diagnosis not present

## 2018-11-08 DIAGNOSIS — R0902 Hypoxemia: Secondary | ICD-10-CM | POA: Diagnosis not present

## 2018-11-08 DIAGNOSIS — L89324 Pressure ulcer of left buttock, stage 4: Secondary | ICD-10-CM | POA: Diagnosis not present

## 2018-11-08 DIAGNOSIS — Z87828 Personal history of other (healed) physical injury and trauma: Secondary | ICD-10-CM | POA: Diagnosis not present

## 2018-11-08 DIAGNOSIS — L89154 Pressure ulcer of sacral region, stage 4: Secondary | ICD-10-CM | POA: Diagnosis not present

## 2018-11-08 DIAGNOSIS — K311 Adult hypertrophic pyloric stenosis: Secondary | ICD-10-CM | POA: Diagnosis not present

## 2018-11-08 DIAGNOSIS — R072 Precordial pain: Secondary | ICD-10-CM | POA: Diagnosis not present

## 2018-11-08 DIAGNOSIS — G839 Paralytic syndrome, unspecified: Secondary | ICD-10-CM | POA: Diagnosis not present

## 2018-11-08 DIAGNOSIS — T83511A Infection and inflammatory reaction due to indwelling urethral catheter, initial encounter: Secondary | ICD-10-CM | POA: Diagnosis not present

## 2018-11-08 DIAGNOSIS — E876 Hypokalemia: Secondary | ICD-10-CM | POA: Diagnosis not present

## 2018-11-08 DIAGNOSIS — R079 Chest pain, unspecified: Secondary | ICD-10-CM | POA: Diagnosis not present

## 2018-11-13 DIAGNOSIS — R0902 Hypoxemia: Secondary | ICD-10-CM | POA: Diagnosis not present

## 2018-11-13 DIAGNOSIS — R5381 Other malaise: Secondary | ICD-10-CM | POA: Diagnosis not present

## 2018-11-13 DIAGNOSIS — Z7401 Bed confinement status: Secondary | ICD-10-CM | POA: Diagnosis not present

## 2018-11-13 DIAGNOSIS — M2550 Pain in unspecified joint: Secondary | ICD-10-CM | POA: Diagnosis not present

## 2018-11-27 DIAGNOSIS — N390 Urinary tract infection, site not specified: Secondary | ICD-10-CM | POA: Diagnosis not present

## 2018-11-27 DIAGNOSIS — G8250 Quadriplegia, unspecified: Secondary | ICD-10-CM | POA: Diagnosis not present

## 2018-11-30 ENCOUNTER — Encounter (HOSPITAL_BASED_OUTPATIENT_CLINIC_OR_DEPARTMENT_OTHER): Payer: Self-pay

## 2018-11-30 ENCOUNTER — Encounter (HOSPITAL_BASED_OUTPATIENT_CLINIC_OR_DEPARTMENT_OTHER): Payer: Medicare Other | Attending: Internal Medicine

## 2018-11-30 DIAGNOSIS — X58XXXA Exposure to other specified factors, initial encounter: Secondary | ICD-10-CM | POA: Insufficient documentation

## 2018-11-30 DIAGNOSIS — L89890 Pressure ulcer of other site, unstageable: Secondary | ICD-10-CM | POA: Insufficient documentation

## 2018-11-30 DIAGNOSIS — L89323 Pressure ulcer of left buttock, stage 3: Secondary | ICD-10-CM | POA: Diagnosis not present

## 2018-11-30 DIAGNOSIS — L89892 Pressure ulcer of other site, stage 2: Secondary | ICD-10-CM | POA: Insufficient documentation

## 2018-11-30 DIAGNOSIS — L89899 Pressure ulcer of other site, unspecified stage: Secondary | ICD-10-CM | POA: Diagnosis not present

## 2018-11-30 DIAGNOSIS — G8250 Quadriplegia, unspecified: Secondary | ICD-10-CM | POA: Insufficient documentation

## 2018-11-30 DIAGNOSIS — L89894 Pressure ulcer of other site, stage 4: Secondary | ICD-10-CM | POA: Insufficient documentation

## 2018-11-30 DIAGNOSIS — L89219 Pressure ulcer of right hip, unspecified stage: Secondary | ICD-10-CM | POA: Diagnosis not present

## 2018-11-30 DIAGNOSIS — L89224 Pressure ulcer of left hip, stage 4: Secondary | ICD-10-CM | POA: Diagnosis not present

## 2018-11-30 DIAGNOSIS — Z933 Colostomy status: Secondary | ICD-10-CM | POA: Insufficient documentation

## 2018-11-30 DIAGNOSIS — G4730 Sleep apnea, unspecified: Secondary | ICD-10-CM | POA: Diagnosis not present

## 2018-11-30 DIAGNOSIS — L89214 Pressure ulcer of right hip, stage 4: Secondary | ICD-10-CM | POA: Diagnosis not present

## 2018-11-30 DIAGNOSIS — I10 Essential (primary) hypertension: Secondary | ICD-10-CM | POA: Diagnosis not present

## 2018-11-30 DIAGNOSIS — S50312A Abrasion of left elbow, initial encounter: Secondary | ICD-10-CM | POA: Insufficient documentation

## 2018-11-30 DIAGNOSIS — L89159 Pressure ulcer of sacral region, unspecified stage: Secondary | ICD-10-CM | POA: Diagnosis not present

## 2018-12-04 DIAGNOSIS — Z933 Colostomy status: Secondary | ICD-10-CM | POA: Diagnosis not present

## 2018-12-04 DIAGNOSIS — R197 Diarrhea, unspecified: Secondary | ICD-10-CM | POA: Diagnosis not present

## 2018-12-04 DIAGNOSIS — R110 Nausea: Secondary | ICD-10-CM | POA: Diagnosis not present

## 2018-12-04 DIAGNOSIS — R630 Anorexia: Secondary | ICD-10-CM | POA: Diagnosis not present

## 2018-12-07 DIAGNOSIS — L8944 Pressure ulcer of contiguous site of back, buttock and hip, stage 4: Secondary | ICD-10-CM | POA: Diagnosis not present

## 2018-12-07 DIAGNOSIS — S31809A Unspecified open wound of unspecified buttock, initial encounter: Secondary | ICD-10-CM | POA: Diagnosis not present

## 2018-12-07 DIAGNOSIS — Z933 Colostomy status: Secondary | ICD-10-CM | POA: Diagnosis not present

## 2018-12-07 DIAGNOSIS — S31102A Unspecified open wound of abdominal wall, epigastric region without penetration into peritoneal cavity, initial encounter: Secondary | ICD-10-CM | POA: Diagnosis not present

## 2018-12-13 DIAGNOSIS — L8990 Pressure ulcer of unspecified site, unspecified stage: Secondary | ICD-10-CM | POA: Diagnosis not present

## 2018-12-13 DIAGNOSIS — N319 Neuromuscular dysfunction of bladder, unspecified: Secondary | ICD-10-CM | POA: Diagnosis not present

## 2018-12-13 DIAGNOSIS — L89224 Pressure ulcer of left hip, stage 4: Secondary | ICD-10-CM | POA: Diagnosis not present

## 2018-12-13 DIAGNOSIS — R1084 Generalized abdominal pain: Secondary | ICD-10-CM | POA: Diagnosis not present

## 2018-12-13 DIAGNOSIS — R52 Pain, unspecified: Secondary | ICD-10-CM | POA: Diagnosis not present

## 2018-12-13 DIAGNOSIS — R0989 Other specified symptoms and signs involving the circulatory and respiratory systems: Secondary | ICD-10-CM | POA: Diagnosis not present

## 2018-12-13 DIAGNOSIS — I959 Hypotension, unspecified: Secondary | ICD-10-CM | POA: Diagnosis not present

## 2018-12-13 DIAGNOSIS — G8250 Quadriplegia, unspecified: Secondary | ICD-10-CM | POA: Diagnosis not present

## 2018-12-13 DIAGNOSIS — Z9049 Acquired absence of other specified parts of digestive tract: Secondary | ICD-10-CM | POA: Diagnosis not present

## 2018-12-13 DIAGNOSIS — Z4682 Encounter for fitting and adjustment of non-vascular catheter: Secondary | ICD-10-CM | POA: Diagnosis not present

## 2018-12-13 DIAGNOSIS — L89314 Pressure ulcer of right buttock, stage 4: Secondary | ICD-10-CM | POA: Diagnosis not present

## 2018-12-13 DIAGNOSIS — L89214 Pressure ulcer of right hip, stage 4: Secondary | ICD-10-CM | POA: Diagnosis not present

## 2018-12-13 DIAGNOSIS — R8290 Unspecified abnormal findings in urine: Secondary | ICD-10-CM | POA: Diagnosis not present

## 2018-12-13 DIAGNOSIS — R339 Retention of urine, unspecified: Secondary | ICD-10-CM | POA: Diagnosis not present

## 2018-12-13 DIAGNOSIS — R140 Abdominal distension (gaseous): Secondary | ICD-10-CM | POA: Diagnosis not present

## 2018-12-13 DIAGNOSIS — Z7401 Bed confinement status: Secondary | ICD-10-CM | POA: Diagnosis not present

## 2018-12-13 DIAGNOSIS — R5381 Other malaise: Secondary | ICD-10-CM | POA: Diagnosis not present

## 2018-12-13 DIAGNOSIS — Z933 Colostomy status: Secondary | ICD-10-CM | POA: Diagnosis not present

## 2018-12-13 DIAGNOSIS — Z981 Arthrodesis status: Secondary | ICD-10-CM | POA: Diagnosis not present

## 2018-12-13 DIAGNOSIS — E876 Hypokalemia: Secondary | ICD-10-CM | POA: Diagnosis not present

## 2018-12-13 DIAGNOSIS — K219 Gastro-esophageal reflux disease without esophagitis: Secondary | ICD-10-CM | POA: Diagnosis not present

## 2018-12-13 DIAGNOSIS — Z20828 Contact with and (suspected) exposure to other viral communicable diseases: Secondary | ICD-10-CM | POA: Diagnosis not present

## 2018-12-13 DIAGNOSIS — K3184 Gastroparesis: Secondary | ICD-10-CM | POA: Diagnosis not present

## 2018-12-13 DIAGNOSIS — Z9359 Other cystostomy status: Secondary | ICD-10-CM | POA: Diagnosis not present

## 2018-12-13 DIAGNOSIS — Z87828 Personal history of other (healed) physical injury and trauma: Secondary | ICD-10-CM | POA: Diagnosis not present

## 2018-12-13 DIAGNOSIS — L89324 Pressure ulcer of left buttock, stage 4: Secondary | ICD-10-CM | POA: Diagnosis not present

## 2018-12-13 DIAGNOSIS — K567 Ileus, unspecified: Secondary | ICD-10-CM | POA: Diagnosis not present

## 2018-12-13 DIAGNOSIS — I10 Essential (primary) hypertension: Secondary | ICD-10-CM | POA: Diagnosis not present

## 2018-12-13 DIAGNOSIS — M2550 Pain in unspecified joint: Secondary | ICD-10-CM | POA: Diagnosis not present

## 2018-12-13 DIAGNOSIS — L89154 Pressure ulcer of sacral region, stage 4: Secondary | ICD-10-CM | POA: Diagnosis not present

## 2018-12-13 DIAGNOSIS — Z87440 Personal history of urinary (tract) infections: Secondary | ICD-10-CM | POA: Diagnosis not present

## 2018-12-13 DIAGNOSIS — D72829 Elevated white blood cell count, unspecified: Secondary | ICD-10-CM | POA: Diagnosis not present

## 2018-12-13 DIAGNOSIS — N390 Urinary tract infection, site not specified: Secondary | ICD-10-CM | POA: Diagnosis not present

## 2018-12-13 DIAGNOSIS — K56609 Unspecified intestinal obstruction, unspecified as to partial versus complete obstruction: Secondary | ICD-10-CM | POA: Diagnosis not present

## 2018-12-13 DIAGNOSIS — K311 Adult hypertrophic pyloric stenosis: Secondary | ICD-10-CM | POA: Diagnosis not present

## 2018-12-14 DIAGNOSIS — N390 Urinary tract infection, site not specified: Secondary | ICD-10-CM | POA: Diagnosis not present

## 2018-12-14 DIAGNOSIS — R339 Retention of urine, unspecified: Secondary | ICD-10-CM | POA: Diagnosis not present

## 2018-12-21 IMAGING — DX DG CHEST 1V PORT
1 series · 1 of 1 positions shown · non-contrast
Comparison: Chest radiograph dated 03/10/2016

CLINICAL DATA: 50-year-old male with sepsis. History of obstructive
sleep apnea.

EXAM:
PORTABLE CHEST 1 VIEW

[chest ap]
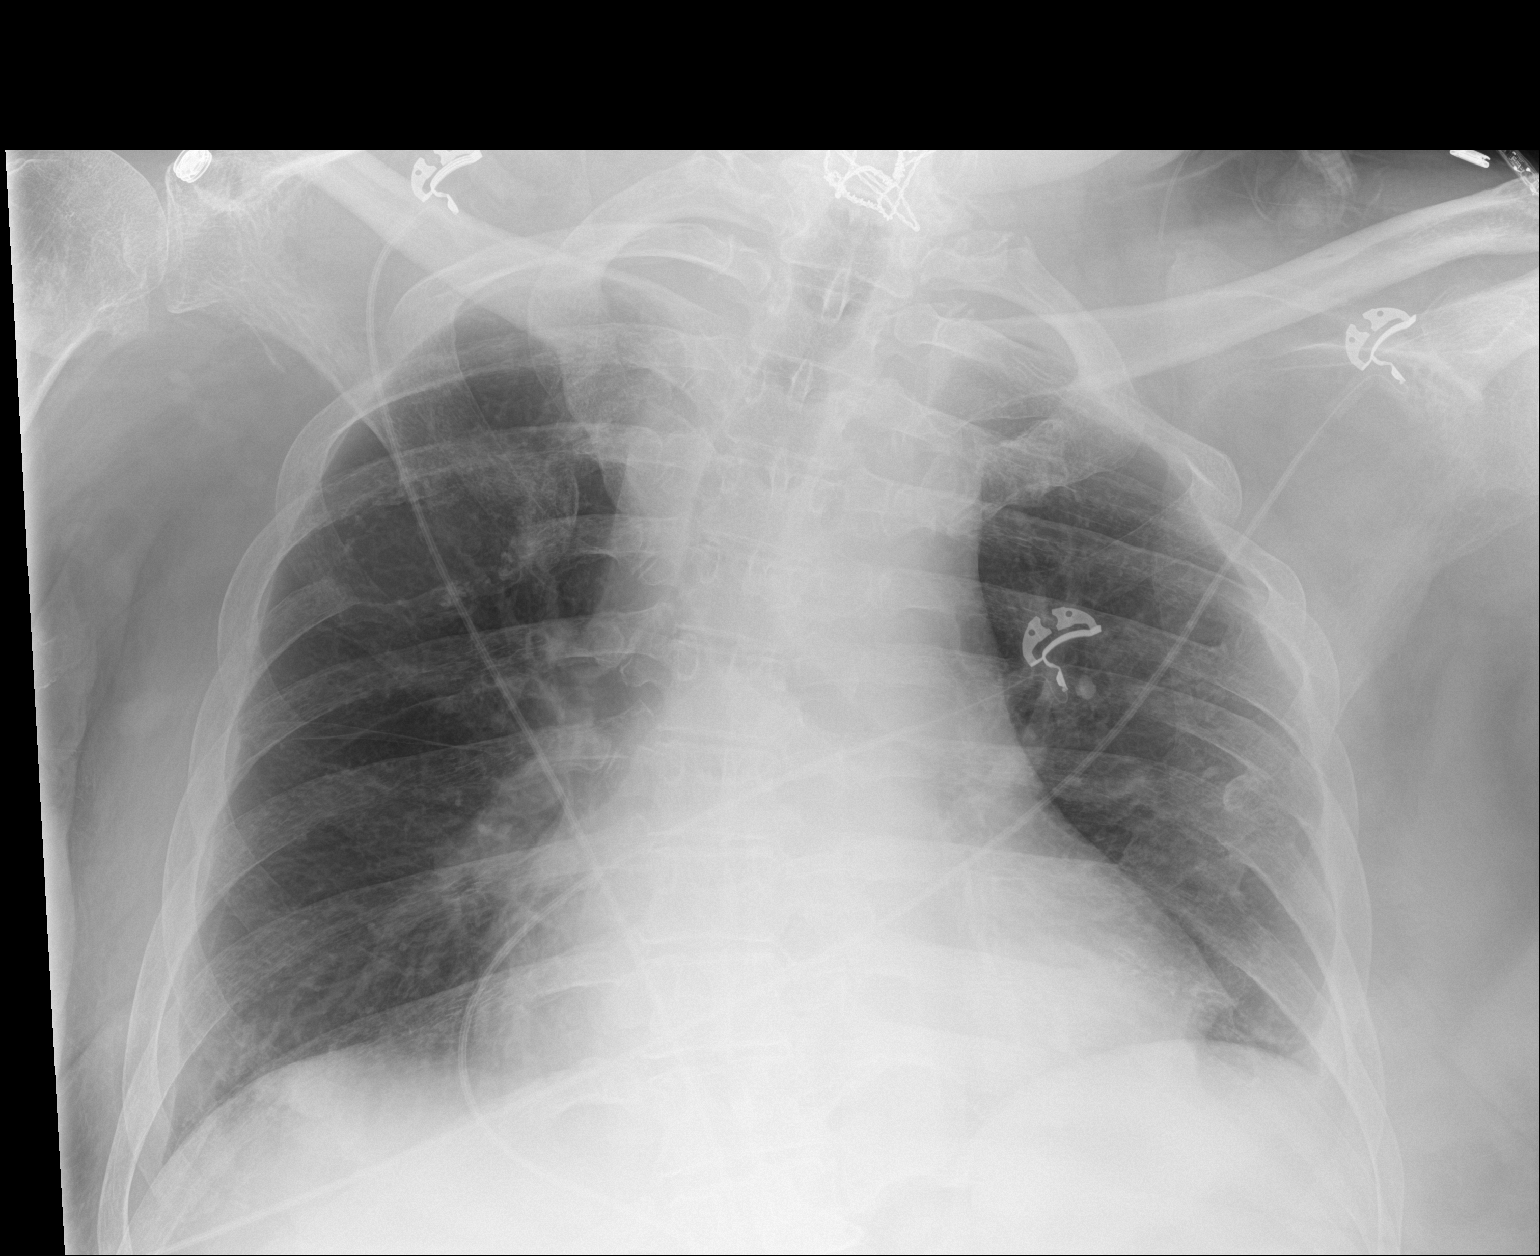

[1 of 1 positions shown; findings below may reference images not displayed]

FINDINGS: Minimal bibasilar atelectatic changes of the lungs. An area of
increased density at the right lung base laterally adjacent to the
right costophrenic angle may represent atelectatic changes or
developing pneumonia. There is a small pleural effusion. No
pneumothorax. Stable cardiomegaly. There is an expansile lytic bone
lesion with destruction of the bone involving the right posterior
fifth rib. Cervical fixation wires noted. No acute fracture.
IMPRESSION: Small bilateral pleural effusions. Right lung base subsegmental
atelectasis versus developing infiltrate. Clinical correlation is
recommended. PA and lateral views of the chest may provide better
evaluation current unremarkable for durable

Stable cardiomegaly.

Expansile lytic lesion involving posterior right fifth rib. Clinical
correlation is recommended.

## 2018-12-25 IMAGING — XA IR FLUORO GUIDE CV LINE*R*
1 series · 1 of 1 positions shown · non-contrast
Comparison: none

INDICATION: 50-year-old male in need of PICC for very poor venous access

[Series 300: line placements · 1 of 1 slices shown]
[im 1/1]
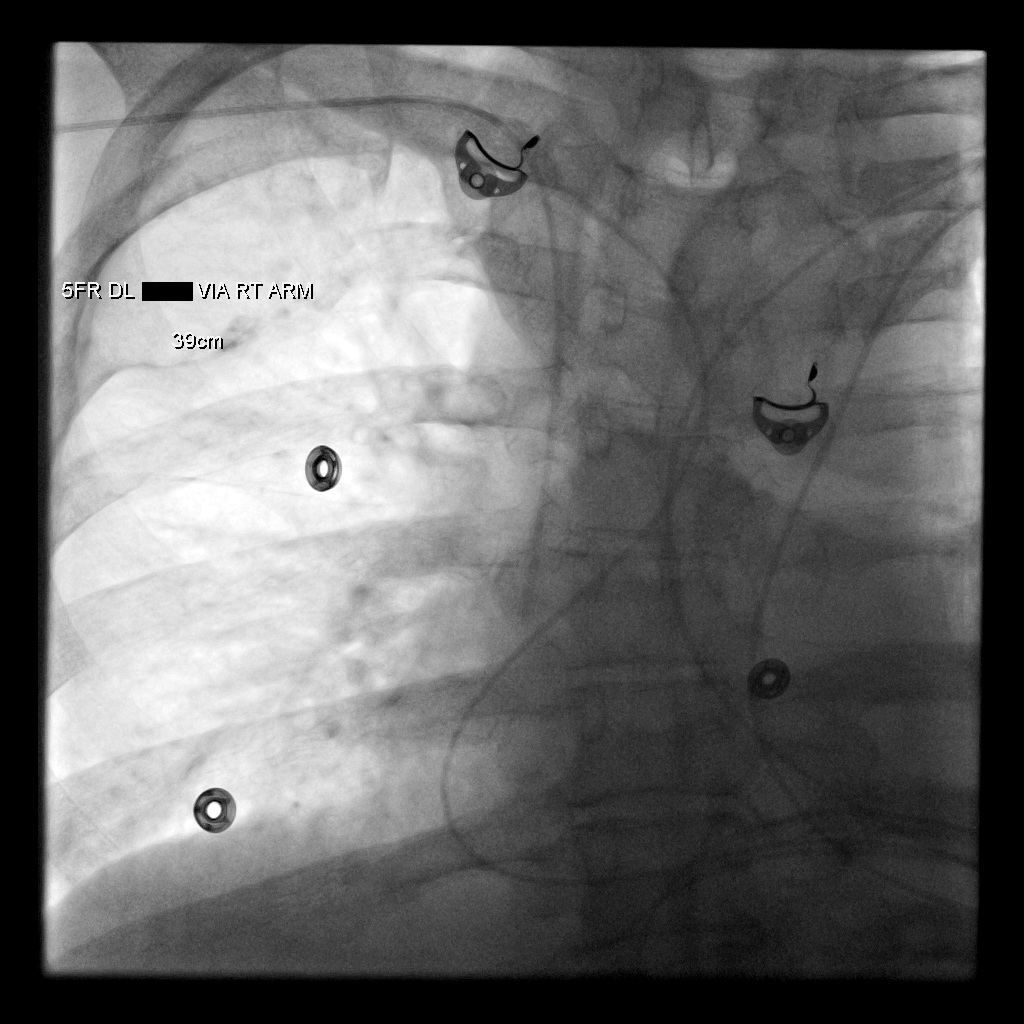

[1 of 1 positions shown; findings below may reference images not displayed]

EXAM:
PICC LINE PLACEMENT WITH ULTRASOUND AND FLUOROSCOPIC GUIDANCE

MEDICATIONS:
None

ANESTHESIA/SEDATION:
None

FLUOROSCOPY TIME:  Fluoroscopy Time: 0 minutes 12 seconds (1.1 MGy).

COMPLICATIONS:
None immediate.

PROCEDURE:
The patient was advised of the possible risks and complications and
agreed to undergo the procedure. The patient was then brought to the
angiographic suite for the procedure.

The right arm was prepped with chlorhexidine, draped in the usual
sterile fashion using maximum barrier technique (cap and mask,
sterile gown, sterile gloves, large sterile sheet, hand hygiene and
cutaneous antisepsis) and infiltrated locally with 1% Lidocaine.

Ultrasound demonstrated patency of the right basilic vein, and this
was documented with an image. Under real-time ultrasound guidance,
this vein was accessed with a 21 gauge micropuncture needle and
image documentation was performed. A [DATE] wire was introduced in to
the vein. Over this, a 5 French dual lumen power injectable PICC was
advanced to the lower SVC/right atrial junction. Fluoroscopy during
the procedure and fluoro spot radiograph confirms appropriate
catheter position. The catheter was flushed and covered with a
sterile dressing.

Catheter length: 39 cm
IMPRESSION: Successful right arm power PICC line placement with ultrasound and
fluoroscopic guidance. The catheter is ready for use.

## 2018-12-25 IMAGING — US IR FLUORO GUIDE CV LINE*R*
1 series · 2 of 2 positions shown · non-contrast
Comparison: none

INDICATION: 50-year-old male in need of PICC for very poor venous access

[Series 1: ir rad eval and mgt. · 2 of 2 slices shown]
[im 1/2]
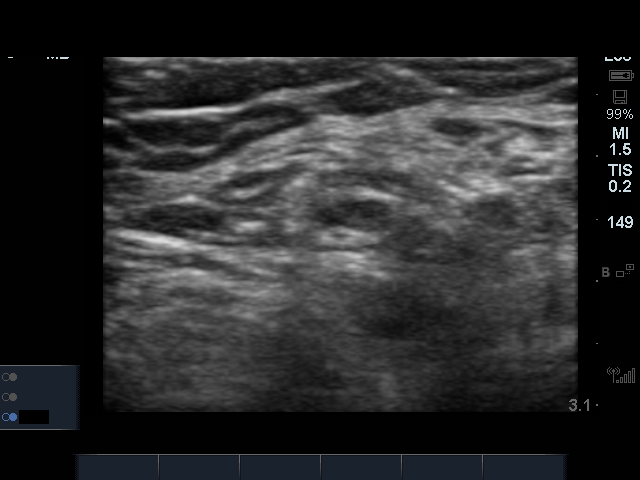
[im 2/2]
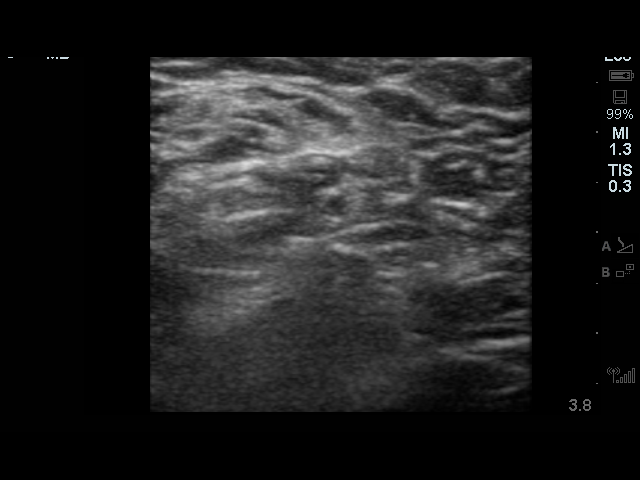

[2 of 2 positions shown; findings below may reference images not displayed]

EXAM:
PICC LINE PLACEMENT WITH ULTRASOUND AND FLUOROSCOPIC GUIDANCE

MEDICATIONS:
None

ANESTHESIA/SEDATION:
None

FLUOROSCOPY TIME:  Fluoroscopy Time: 0 minutes 12 seconds (1.1 MGy).

COMPLICATIONS:
None immediate.

PROCEDURE:
The patient was advised of the possible risks and complications and
agreed to undergo the procedure. The patient was then brought to the
angiographic suite for the procedure.

The right arm was prepped with chlorhexidine, draped in the usual
sterile fashion using maximum barrier technique (cap and mask,
sterile gown, sterile gloves, large sterile sheet, hand hygiene and
cutaneous antisepsis) and infiltrated locally with 1% Lidocaine.

Ultrasound demonstrated patency of the right basilic vein, and this
was documented with an image. Under real-time ultrasound guidance,
this vein was accessed with a 21 gauge micropuncture needle and
image documentation was performed. A [DATE] wire was introduced in to
the vein. Over this, a 5 French dual lumen power injectable PICC was
advanced to the lower SVC/right atrial junction. Fluoroscopy during
the procedure and fluoro spot radiograph confirms appropriate
catheter position. The catheter was flushed and covered with a
sterile dressing.

Catheter length: 39 cm
IMPRESSION: Successful right arm power PICC line placement with ultrasound and
fluoroscopic guidance. The catheter is ready for use.

## 2018-12-26 IMAGING — MR MR PELVIS WO/W CM
5 of 12 series · 18 of 48 positions shown · IV contrast (multihance)
Comparison: Multiple prior CT scans.

CLINICAL DATA: Sepsis. History of chronic decubitus ulcers and
chronic osteomyelitis.

EXAM:
MRI PELVIS WITHOUT AND WITH CONTRAST
TECHNIQUE: Multiplanar multisequence MR imaging of the pelvis was performed
both before and after administration of intravenous contrast.
CONTRAST:  20mL MULTIHANCE GADOBENATE DIMEGLUMINE 529 MG/ML IV SOLN

[Series 8: T2 fat-sat · axial · 6.0mm · 0.66mm/px · z∈[-204,+75]mm · 4 of 42 slices shown]
[im 1/42]
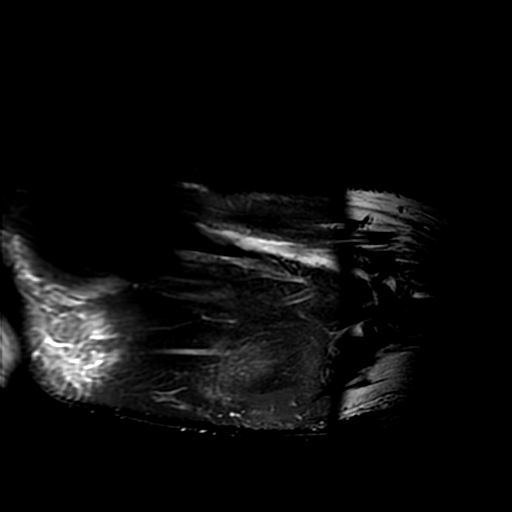
[im 14/42]
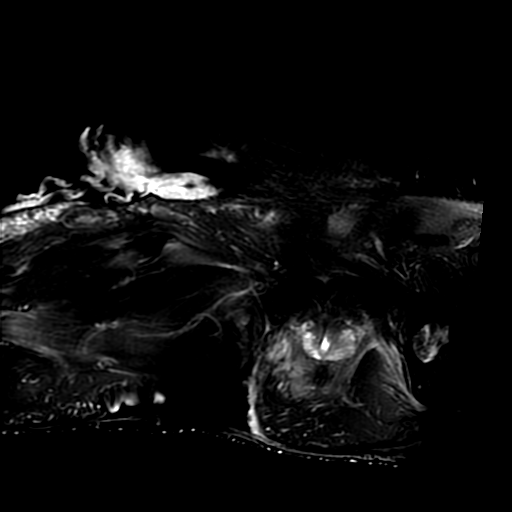
[im 28/42]
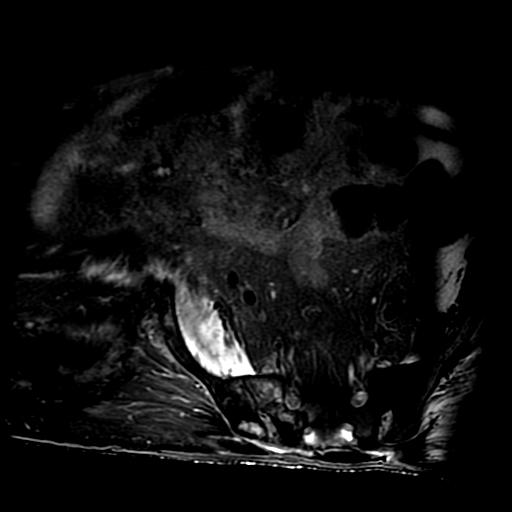
[im 42/42]
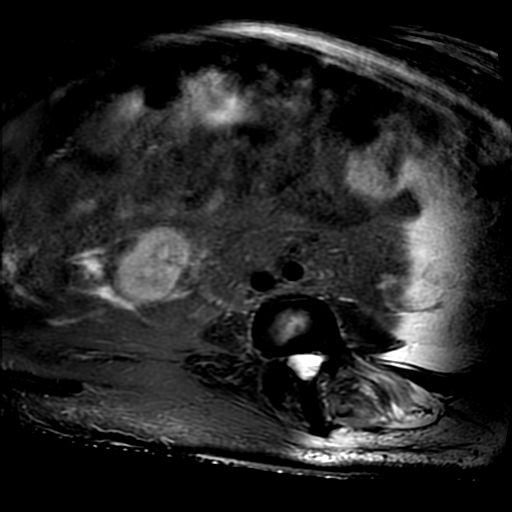

[Series 10: cor ssfse · coronal · 6.0mm · 0.82mm/px · 4 of 39 slices shown]
[im 1/39]
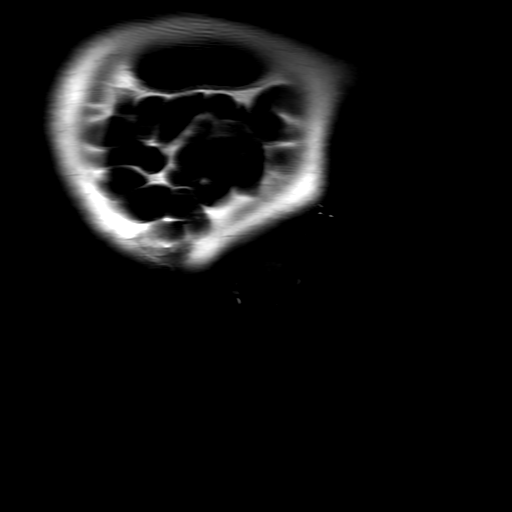
[im 13/39]
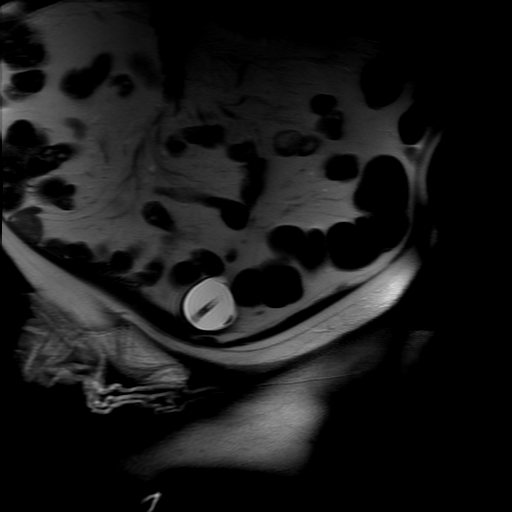
[im 26/39]
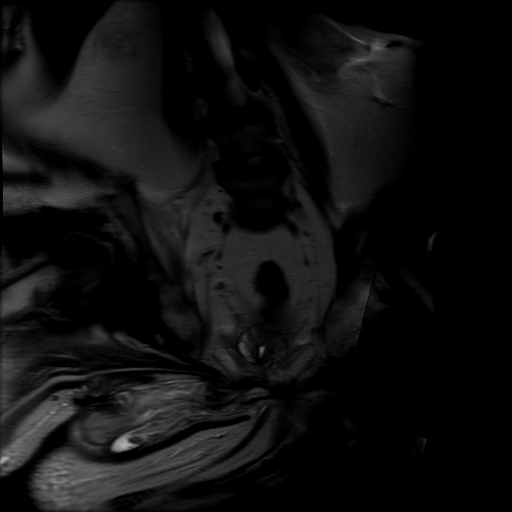
[im 39/39]
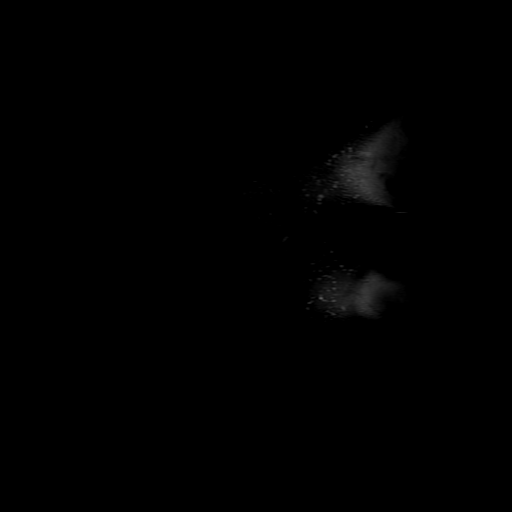

[Series 11: T2 · coronal · 6.0mm · 0.82mm/px · 4 of 39 slices shown (1 of 2)]
[im 1/39]
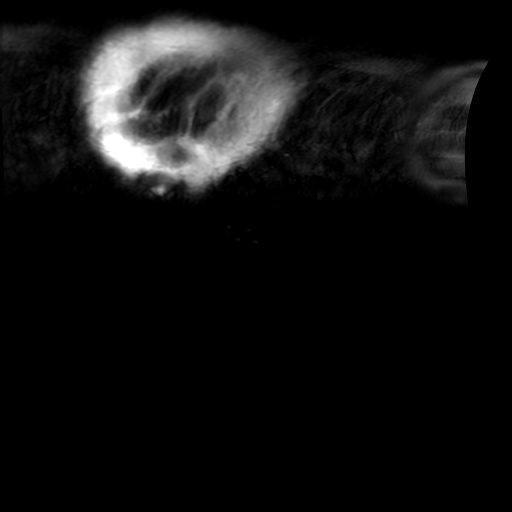
[im 13/39]
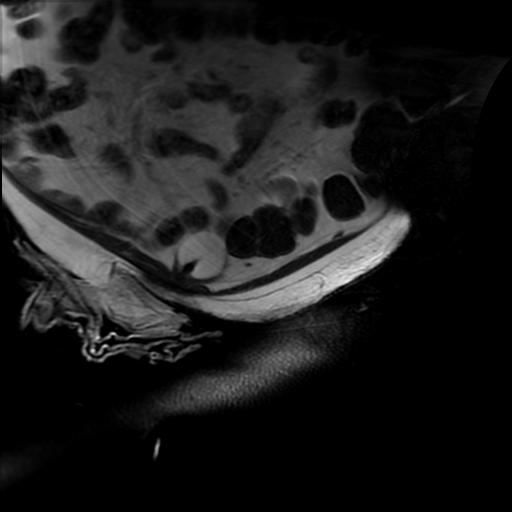
[im 26/39]
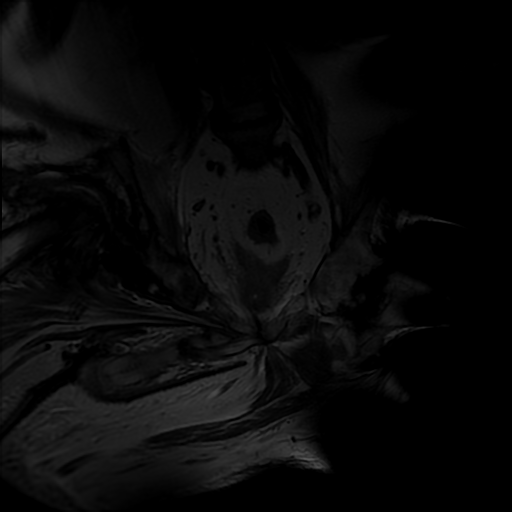
[im 39/39]
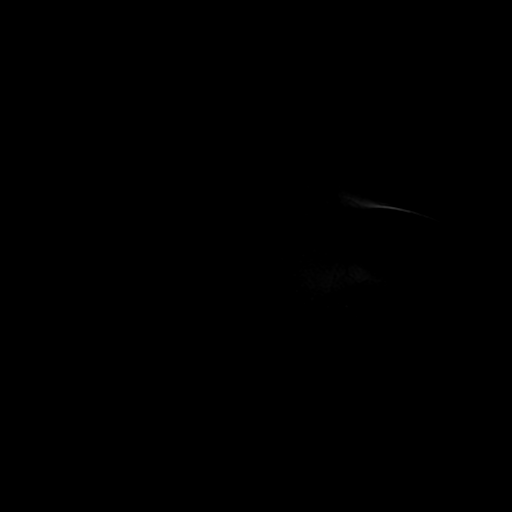

[Series 13: T2 · axial · 6.0mm · 0.66mm/px · z∈[-204,+75]mm · 4 of 42 slices shown (2 of 2)]
[im 1/42]
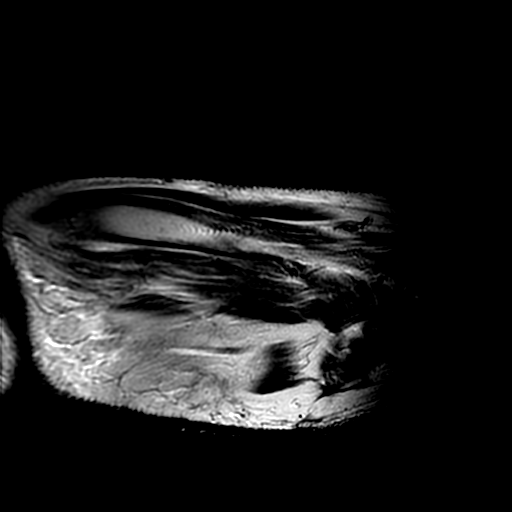
[im 14/42]
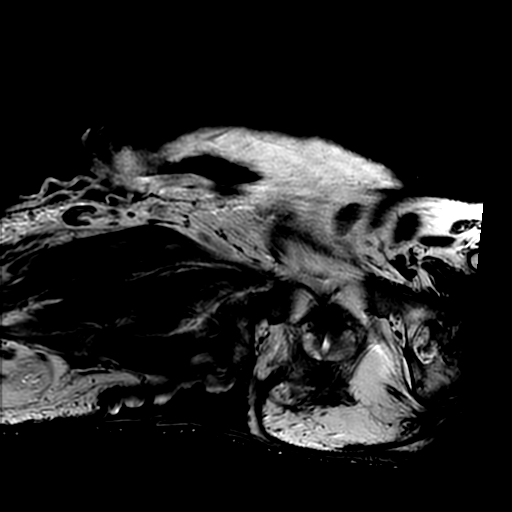
[im 28/42]
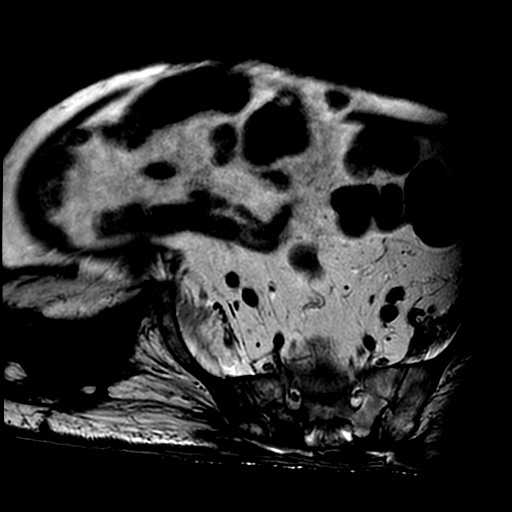
[im 42/42]
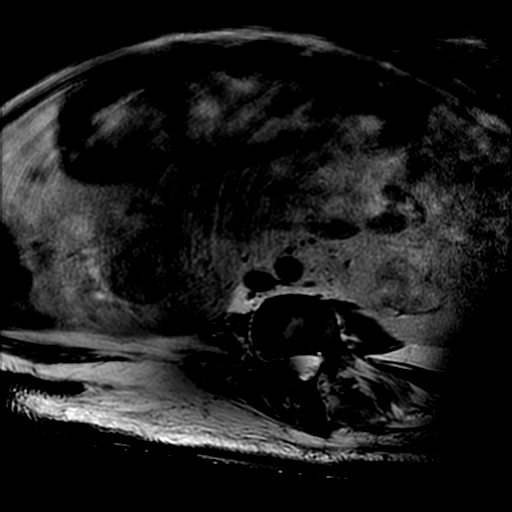

[Series 14: T1 · axial · 6.0mm · 0.66mm/px · z∈[-204,-115]mm · 2 of 42 slices shown]
[im 1/42]
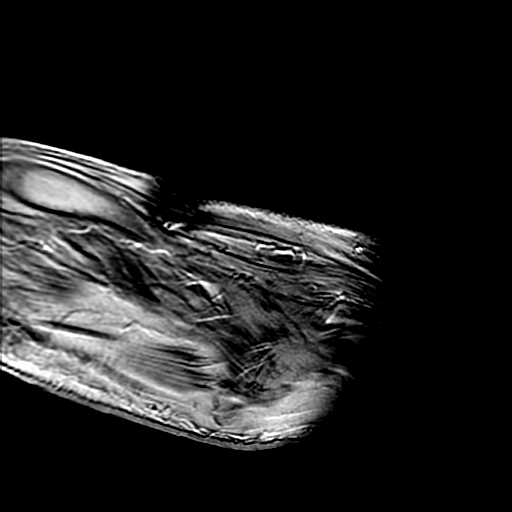
[im 14/42]
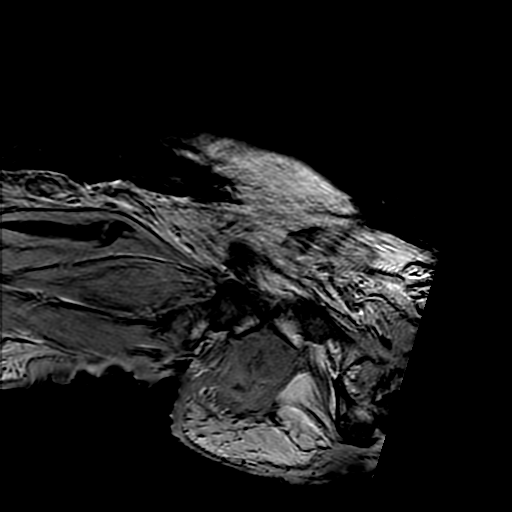

[18 of 48 positions shown; findings below may reference images not displayed]

FINDINGS: Examination is limited by motion, position of the patient and signal
dropout.

Urinary Tract: There is a suprapubic catheter in place. Chronically
thick walled bladder which is poorly distended.

Bowel: The rectum and sigmoid colon are grossly normal. The
visualized small bowel loops and colon are unremarkable.

Vascular/Lymphatic: No pelvic lymphadenopathy. Borderline inguinal
lymph nodes bilaterally.

Reproductive:  The prostate gland is grossly normal.

Other: No free pelvic fluid collections or rim enhancing intra
pelvic abscess.

Musculoskeletal: Severe chronic deformities of both hips and the
bony pelvis consistent with chronic osteomyelitis. Large sacral
decubitus ulcer extends right down to the sacrum. Edema like signal
abnormality and enhancement consistent with acute on chronic
osteomyelitis involving the sacrum and iliac bones. There is also a
decubitus ulcer extending into the right hip joint which contains
air. Severe chronic inflammation and granulation tissue is
demonstrated without definite drainable abscess.

The left hip also contains enhancing granulation tissue without
discrete drainable soft tissue abscess.

Inflammation and edema noted in the right iliopsoas muscle which is
likely myositis. No discrete drainable soft tissue abscess.
IMPRESSION: 1. Severe chronic bony deformities involving both hips likely due to
chronic septic arthritis and osteomyelitis. No discrete drainable
abscess is identified.
2. Changes of acute and chronic osteomyelitis involving the bony
pelvis.
3. Chronic bladder wall thickening.
4. No intrapelvic abscess is identified.

## 2018-12-29 ENCOUNTER — Emergency Department (HOSPITAL_COMMUNITY): Payer: Medicare Other

## 2018-12-29 ENCOUNTER — Other Ambulatory Visit: Payer: Self-pay

## 2018-12-29 ENCOUNTER — Inpatient Hospital Stay (HOSPITAL_COMMUNITY)
Admission: EM | Admit: 2018-12-29 | Discharge: 2019-01-04 | DRG: 602 | Disposition: A | Discharge status: Home Health | Payer: Medicare Other | Attending: Internal Medicine | Admitting: Internal Medicine

## 2018-12-29 DIAGNOSIS — E876 Hypokalemia: Secondary | ICD-10-CM | POA: Diagnosis present

## 2018-12-29 DIAGNOSIS — Z1612 Extended spectrum beta lactamase (ESBL) resistance: Secondary | ICD-10-CM | POA: Diagnosis not present

## 2018-12-29 DIAGNOSIS — D638 Anemia in other chronic diseases classified elsewhere: Secondary | ICD-10-CM | POA: Diagnosis present

## 2018-12-29 DIAGNOSIS — K3184 Gastroparesis: Secondary | ICD-10-CM | POA: Diagnosis present

## 2018-12-29 DIAGNOSIS — I9589 Other hypotension: Secondary | ICD-10-CM | POA: Diagnosis not present

## 2018-12-29 DIAGNOSIS — G8250 Quadriplegia, unspecified: Secondary | ICD-10-CM | POA: Diagnosis not present

## 2018-12-29 DIAGNOSIS — Z95828 Presence of other vascular implants and grafts: Secondary | ICD-10-CM | POA: Diagnosis not present

## 2018-12-29 DIAGNOSIS — Z79899 Other long term (current) drug therapy: Secondary | ICD-10-CM | POA: Diagnosis not present

## 2018-12-29 DIAGNOSIS — Z960 Presence of urogenital implants: Secondary | ICD-10-CM | POA: Diagnosis not present

## 2018-12-29 DIAGNOSIS — N189 Chronic kidney disease, unspecified: Secondary | ICD-10-CM | POA: Diagnosis present

## 2018-12-29 DIAGNOSIS — L02415 Cutaneous abscess of right lower limb: Secondary | ICD-10-CM | POA: Diagnosis present

## 2018-12-29 DIAGNOSIS — Z1624 Resistance to multiple antibiotics: Secondary | ICD-10-CM | POA: Diagnosis present

## 2018-12-29 DIAGNOSIS — L89154 Pressure ulcer of sacral region, stage 4: Secondary | ICD-10-CM | POA: Diagnosis not present

## 2018-12-29 DIAGNOSIS — L02211 Cutaneous abscess of abdominal wall: Secondary | ICD-10-CM | POA: Diagnosis present

## 2018-12-29 DIAGNOSIS — B9620 Unspecified Escherichia coli [E. coli] as the cause of diseases classified elsewhere: Secondary | ICD-10-CM | POA: Diagnosis present

## 2018-12-29 DIAGNOSIS — Z933 Colostomy status: Secondary | ICD-10-CM

## 2018-12-29 DIAGNOSIS — Z87440 Personal history of urinary (tract) infections: Secondary | ICD-10-CM | POA: Diagnosis not present

## 2018-12-29 DIAGNOSIS — D649 Anemia, unspecified: Secondary | ICD-10-CM

## 2018-12-29 DIAGNOSIS — Z20828 Contact with and (suspected) exposure to other viral communicable diseases: Secondary | ICD-10-CM | POA: Diagnosis present

## 2018-12-29 DIAGNOSIS — I959 Hypotension, unspecified: Secondary | ICD-10-CM | POA: Diagnosis not present

## 2018-12-29 DIAGNOSIS — Z6826 Body mass index (BMI) 26.0-26.9, adult: Secondary | ICD-10-CM

## 2018-12-29 DIAGNOSIS — N319 Neuromuscular dysfunction of bladder, unspecified: Secondary | ICD-10-CM | POA: Diagnosis present

## 2018-12-29 DIAGNOSIS — Z9049 Acquired absence of other specified parts of digestive tract: Secondary | ICD-10-CM | POA: Diagnosis not present

## 2018-12-29 DIAGNOSIS — M4628 Osteomyelitis of vertebra, sacral and sacrococcygeal region: Secondary | ICD-10-CM | POA: Diagnosis not present

## 2018-12-29 DIAGNOSIS — K219 Gastro-esophageal reflux disease without esophagitis: Secondary | ICD-10-CM | POA: Diagnosis not present

## 2018-12-29 DIAGNOSIS — B9562 Methicillin resistant Staphylococcus aureus infection as the cause of diseases classified elsewhere: Secondary | ICD-10-CM | POA: Diagnosis not present

## 2018-12-29 DIAGNOSIS — Z881 Allergy status to other antibiotic agents status: Secondary | ICD-10-CM | POA: Diagnosis not present

## 2018-12-29 DIAGNOSIS — R05 Cough: Secondary | ICD-10-CM | POA: Diagnosis not present

## 2018-12-29 DIAGNOSIS — Z7401 Bed confinement status: Secondary | ICD-10-CM | POA: Diagnosis not present

## 2018-12-29 DIAGNOSIS — G4733 Obstructive sleep apnea (adult) (pediatric): Secondary | ICD-10-CM | POA: Diagnosis not present

## 2018-12-29 DIAGNOSIS — I129 Hypertensive chronic kidney disease with stage 1 through stage 4 chronic kidney disease, or unspecified chronic kidney disease: Secondary | ICD-10-CM | POA: Diagnosis present

## 2018-12-29 DIAGNOSIS — L8994 Pressure ulcer of unspecified site, stage 4: Secondary | ICD-10-CM | POA: Diagnosis not present

## 2018-12-29 DIAGNOSIS — L03311 Cellulitis of abdominal wall: Principal | ICD-10-CM | POA: Diagnosis present

## 2018-12-29 DIAGNOSIS — E871 Hypo-osmolality and hyponatremia: Secondary | ICD-10-CM | POA: Diagnosis present

## 2018-12-29 DIAGNOSIS — R5381 Other malaise: Secondary | ICD-10-CM | POA: Diagnosis not present

## 2018-12-29 DIAGNOSIS — R0600 Dyspnea, unspecified: Secondary | ICD-10-CM

## 2018-12-29 DIAGNOSIS — R69 Illness, unspecified: Secondary | ICD-10-CM | POA: Diagnosis not present

## 2018-12-29 DIAGNOSIS — Z833 Family history of diabetes mellitus: Secondary | ICD-10-CM

## 2018-12-29 DIAGNOSIS — M2550 Pain in unspecified joint: Secondary | ICD-10-CM | POA: Diagnosis not present

## 2018-12-29 DIAGNOSIS — E669 Obesity, unspecified: Secondary | ICD-10-CM | POA: Diagnosis not present

## 2018-12-29 DIAGNOSIS — Z22322 Carrier or suspected carrier of Methicillin resistant Staphylococcus aureus: Secondary | ICD-10-CM | POA: Diagnosis not present

## 2018-12-29 DIAGNOSIS — G8253 Quadriplegia, C5-C7 complete: Secondary | ICD-10-CM | POA: Diagnosis not present

## 2018-12-29 DIAGNOSIS — Z872 Personal history of diseases of the skin and subcutaneous tissue: Secondary | ICD-10-CM | POA: Diagnosis not present

## 2018-12-29 DIAGNOSIS — L0390 Cellulitis, unspecified: Secondary | ICD-10-CM | POA: Diagnosis not present

## 2018-12-29 DIAGNOSIS — Z888 Allergy status to other drugs, medicaments and biological substances status: Secondary | ICD-10-CM | POA: Diagnosis not present

## 2018-12-29 DIAGNOSIS — R0602 Shortness of breath: Secondary | ICD-10-CM | POA: Diagnosis not present

## 2018-12-29 DIAGNOSIS — L0291 Cutaneous abscess, unspecified: Secondary | ICD-10-CM

## 2018-12-29 DIAGNOSIS — R140 Abdominal distension (gaseous): Secondary | ICD-10-CM | POA: Diagnosis not present

## 2018-12-29 DIAGNOSIS — L02212 Cutaneous abscess of back [any part, except buttock]: Secondary | ICD-10-CM | POA: Diagnosis not present

## 2018-12-29 DIAGNOSIS — G8220 Paraplegia, unspecified: Secondary | ICD-10-CM | POA: Diagnosis not present

## 2018-12-29 DIAGNOSIS — Z885 Allergy status to narcotic agent status: Secondary | ICD-10-CM | POA: Diagnosis not present

## 2018-12-29 LAB — CBC WITH DIFFERENTIAL/PLATELET
Abs Immature Granulocytes: 0.08 10*3/uL — ABNORMAL HIGH (ref 0.00–0.07)
Basophils Absolute: 0 10*3/uL (ref 0.0–0.1)
Basophils Relative: 0 %
Eosinophils Absolute: 0 10*3/uL (ref 0.0–0.5)
Eosinophils Relative: 0 %
HCT: 22.4 % — ABNORMAL LOW (ref 39.0–52.0)
Hemoglobin: 6.9 g/dL — CL (ref 13.0–17.0)
Immature Granulocytes: 1 %
Lymphocytes Relative: 8 %
Lymphs Abs: 0.9 10*3/uL (ref 0.7–4.0)
MCH: 25.6 pg — ABNORMAL LOW (ref 26.0–34.0)
MCHC: 30.8 g/dL (ref 30.0–36.0)
MCV: 83 fL (ref 80.0–100.0)
Monocytes Absolute: 1.1 10*3/uL — ABNORMAL HIGH (ref 0.1–1.0)
Monocytes Relative: 10 %
Neutro Abs: 8.9 10*3/uL — ABNORMAL HIGH (ref 1.7–7.7)
Neutrophils Relative %: 81 %
Platelets: 328 10*3/uL (ref 150–400)
RBC: 2.7 MIL/uL — ABNORMAL LOW (ref 4.22–5.81)
RDW: 16.3 % — ABNORMAL HIGH (ref 11.5–15.5)
WBC: 11 10*3/uL — ABNORMAL HIGH (ref 4.0–10.5)
nRBC: 0 % (ref 0.0–0.2)

## 2018-12-29 LAB — BASIC METABOLIC PANEL
Anion gap: 8 (ref 5–15)
BUN: 15 mg/dL (ref 6–20)
CO2: 21 mmol/L — ABNORMAL LOW (ref 22–32)
Calcium: 7.9 mg/dL — ABNORMAL LOW (ref 8.9–10.3)
Chloride: 104 mmol/L (ref 98–111)
Creatinine, Ser: 0.49 mg/dL — ABNORMAL LOW (ref 0.61–1.24)
GFR calc Af Amer: >60 mL/min (ref >60)
GFR calc non Af Amer: >60 mL/min (ref >60)
Glucose, Bld: 126 mg/dL — ABNORMAL HIGH (ref 70–99)
Potassium: 3.7 mmol/L (ref 3.5–5.1)
Sodium: 133 mmol/L — ABNORMAL LOW (ref 135–145)

## 2018-12-29 LAB — LACTIC ACID, PLASMA: Lactic Acid, Venous: 1.7 mmol/L (ref 0.5–1.9)

## 2018-12-29 LAB — PREPARE RBC (CROSSMATCH)

## 2018-12-29 MED ORDER — FERROUS SULFATE 325 (65 FE) MG PO TABS
325.00 | ORAL_TABLET | ORAL | Status: DC
Start: 2018-12-28 — End: 2018-12-29

## 2018-12-29 MED ORDER — ONDANSETRON HCL 4 MG/2ML IJ SOLN
4.00 | INTRAMUSCULAR | Status: DC
Start: ? — End: 2018-12-29

## 2018-12-29 MED ORDER — BENZOCAINE-MENTHOL 6-10 MG MT LOZG
1.00 | LOZENGE | OROMUCOSAL | Status: DC
Start: ? — End: 2018-12-29

## 2018-12-29 MED ORDER — SUCRALFATE 1 G PO TABS
1.0000 g | ORAL_TABLET | Freq: Three times a day (TID) | ORAL | Status: DC
  Administered 2018-12-30 – 2019-01-04 (×25): 1 g via ORAL
  Filled 2018-12-29 (×25): qty 1

## 2018-12-29 MED ORDER — CLINDAMYCIN PHOSPHATE 600 MG/50ML IV SOLN
600.0000 mg | Freq: Once | INTRAVENOUS | Status: AC
  Administered 2018-12-29: 600 mg via INTRAVENOUS
  Filled 2018-12-29: qty 50

## 2018-12-29 MED ORDER — BACLOFEN 10 MG PO TABS
20.00 | ORAL_TABLET | ORAL | Status: DC
Start: 2018-12-28 — End: 2018-12-29

## 2018-12-29 MED ORDER — PANTOPRAZOLE SODIUM 40 MG IV SOLR
40.00 | INTRAVENOUS | Status: DC
Start: 2018-12-28 — End: 2018-12-29

## 2018-12-29 MED ORDER — HEPARIN SODIUM (PORCINE) 5000 UNIT/ML IJ SOLN
5000.00 | INTRAMUSCULAR | Status: DC
Start: 2018-12-27 — End: 2018-12-29

## 2018-12-29 MED ORDER — GENERIC EXTERNAL MEDICATION
12.50 | Status: DC
Start: ? — End: 2018-12-29

## 2018-12-29 MED ORDER — METOCLOPRAMIDE HCL 5 MG/ML IJ SOLN
10.00 | INTRAMUSCULAR | Status: DC
Start: 2018-12-27 — End: 2018-12-29

## 2018-12-29 MED ORDER — MORPHINE SULFATE 2 MG/ML IJ SOLN
2.00 | INTRAMUSCULAR | Status: DC
Start: ? — End: 2018-12-29

## 2018-12-29 MED ORDER — MIDODRINE HCL 5 MG PO TABS
10.0000 mg | ORAL_TABLET | Freq: Three times a day (TID) | ORAL | Status: DC
  Administered 2018-12-30 – 2018-12-31 (×4): 10 mg via ORAL
  Filled 2018-12-29 (×8): qty 2

## 2018-12-29 MED ORDER — SODIUM CHLORIDE 0.9 % IV SOLN: INTRAVENOUS | Status: DC

## 2018-12-29 MED ORDER — PANTOPRAZOLE SODIUM 40 MG PO TBEC
40.0000 mg | DELAYED_RELEASE_TABLET | Freq: Two times a day (BID) | ORAL | Status: DC
  Administered 2018-12-30 – 2019-01-04 (×13): 40 mg via ORAL
  Filled 2018-12-29 (×13): qty 1

## 2018-12-29 MED ORDER — ACETAMINOPHEN 325 MG PO TABS
650.00 | ORAL_TABLET | ORAL | Status: DC
Start: ? — End: 2018-12-29

## 2018-12-29 MED ORDER — ONDANSETRON 4 MG PO TBDP
8.0000 mg | ORAL_TABLET | Freq: Three times a day (TID) | ORAL | Status: DC | PRN
  Administered 2018-12-30 – 2019-01-04 (×5): 8 mg via ORAL
  Filled 2018-12-29 (×5): qty 2

## 2018-12-29 MED ORDER — POTASSIUM CHLORIDE CRYS ER 20 MEQ PO TBCR
40.00 | EXTENDED_RELEASE_TABLET | ORAL | Status: DC
Start: 2018-12-28 — End: 2018-12-29

## 2018-12-29 MED ORDER — MAGNESIUM OXIDE 400 MG PO TABS
400.00 | ORAL_TABLET | ORAL | Status: DC
Start: 2018-12-28 — End: 2018-12-29

## 2018-12-29 MED ORDER — CLINDAMYCIN PHOSPHATE 600 MG/50ML IV SOLN: 600.0000 mg | Freq: Once | INTRAVENOUS | Status: DC

## 2018-12-29 MED ORDER — GENERIC EXTERNAL MEDICATION
2.00 | Status: DC
Start: 2018-12-28 — End: 2018-12-29

## 2018-12-29 MED ORDER — IOHEXOL 300 MG/ML  SOLN
100.0000 mL | Freq: Once | INTRAMUSCULAR | Status: AC | PRN
  Administered 2018-12-29: 100 mL via INTRAVENOUS

## 2018-12-29 MED ORDER — GENERIC EXTERNAL MEDICATION
500.00 | Status: DC
Start: 2018-12-28 — End: 2018-12-29

## 2018-12-29 MED ORDER — SODIUM CHLORIDE 0.9 % IV SOLN
INTRAVENOUS | Status: DC
  Administered 2018-12-29: 18:00:00 via INTRAVENOUS

## 2018-12-29 MED ORDER — ACETAMINOPHEN 650 MG RE SUPP
650.00 | RECTAL | Status: DC
Start: ? — End: 2018-12-29

## 2018-12-29 MED ORDER — SODIUM CHLORIDE 0.9% IV SOLUTION: Freq: Once | INTRAVENOUS | Status: DC

## 2018-12-29 MED ORDER — MIDODRINE HCL 5 MG PO TABS
10.00 | ORAL_TABLET | ORAL | Status: DC
Start: 2018-12-28 — End: 2018-12-29

## 2018-12-29 MED ORDER — CLINDAMYCIN PHOSPHATE 600 MG/50ML IV SOLN: 600.0000 mg | Freq: Three times a day (TID) | INTRAVENOUS | Status: DC

## 2018-12-29 MED ORDER — ALBUTEROL SULFATE HFA 108 (90 BASE) MCG/ACT IN AERS
2.00 | INHALATION_SPRAY | RESPIRATORY_TRACT | Status: DC
Start: ? — End: 2018-12-29

## 2018-12-29 MED ORDER — COLLAGENASE 250 UNIT/GM EX OINT
TOPICAL_OINTMENT | CUTANEOUS | Status: DC
Start: 2018-12-28 — End: 2018-12-29
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
## 2018-12-29 NOTE — ED Notes (Signed)
Stuck pt.was unable to get blood .

## 2018-12-29 NOTE — ED Notes (Signed)
Second attempt call to floor

## 2018-12-29 NOTE — ED Triage Notes (Signed)
Pt brought in by EMS c/o ABD Wound infection noticed by the home health nurse during care at home. Pt states that wound had been there for a few months but nurse noticed green/black liquid leaking from wound today. 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 VS are stable with no complaints of pain. Also has a Conservator, museum/gallery.

## 2018-12-29 NOTE — ED Notes (Signed)
Attempted call to floor
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
## 2018-12-30 ENCOUNTER — Other Ambulatory Visit: Payer: Self-pay

## 2018-12-30 ENCOUNTER — Encounter (HOSPITAL_COMMUNITY): Payer: Self-pay

## 2018-12-30 DIAGNOSIS — Z20828 Contact with and (suspected) exposure to other viral communicable diseases: Secondary | ICD-10-CM | POA: Diagnosis present

## 2018-12-30 DIAGNOSIS — Z933 Colostomy status: Secondary | ICD-10-CM | POA: Diagnosis not present

## 2018-12-30 DIAGNOSIS — N319 Neuromuscular dysfunction of bladder, unspecified: Secondary | ICD-10-CM | POA: Diagnosis present

## 2018-12-30 DIAGNOSIS — Z6826 Body mass index (BMI) 26.0-26.9, adult: Secondary | ICD-10-CM | POA: Diagnosis not present

## 2018-12-30 DIAGNOSIS — N189 Chronic kidney disease, unspecified: Secondary | ICD-10-CM | POA: Diagnosis present

## 2018-12-30 DIAGNOSIS — Z888 Allergy status to other drugs, medicaments and biological substances status: Secondary | ICD-10-CM | POA: Diagnosis not present

## 2018-12-30 DIAGNOSIS — Z885 Allergy status to narcotic agent status: Secondary | ICD-10-CM | POA: Diagnosis not present

## 2018-12-30 DIAGNOSIS — Z1624 Resistance to multiple antibiotics: Secondary | ICD-10-CM | POA: Diagnosis present

## 2018-12-30 DIAGNOSIS — L02211 Cutaneous abscess of abdominal wall: Secondary | ICD-10-CM | POA: Diagnosis present

## 2018-12-30 DIAGNOSIS — I959 Hypotension, unspecified: Secondary | ICD-10-CM | POA: Diagnosis not present

## 2018-12-30 DIAGNOSIS — K3184 Gastroparesis: Secondary | ICD-10-CM | POA: Diagnosis present

## 2018-12-30 DIAGNOSIS — R0602 Shortness of breath: Secondary | ICD-10-CM | POA: Diagnosis not present

## 2018-12-30 DIAGNOSIS — E871 Hypo-osmolality and hyponatremia: Secondary | ICD-10-CM | POA: Diagnosis present

## 2018-12-30 DIAGNOSIS — L02415 Cutaneous abscess of right lower limb: Secondary | ICD-10-CM | POA: Insufficient documentation

## 2018-12-30 DIAGNOSIS — E669 Obesity, unspecified: Secondary | ICD-10-CM | POA: Diagnosis present

## 2018-12-30 DIAGNOSIS — Z833 Family history of diabetes mellitus: Secondary | ICD-10-CM | POA: Diagnosis not present

## 2018-12-30 DIAGNOSIS — I129 Hypertensive chronic kidney disease with stage 1 through stage 4 chronic kidney disease, or unspecified chronic kidney disease: Secondary | ICD-10-CM | POA: Diagnosis present

## 2018-12-30 DIAGNOSIS — Z9049 Acquired absence of other specified parts of digestive tract: Secondary | ICD-10-CM | POA: Diagnosis not present

## 2018-12-30 DIAGNOSIS — Z79899 Other long term (current) drug therapy: Secondary | ICD-10-CM | POA: Diagnosis not present

## 2018-12-30 DIAGNOSIS — Z1612 Extended spectrum beta lactamase (ESBL) resistance: Secondary | ICD-10-CM | POA: Diagnosis not present

## 2018-12-30 DIAGNOSIS — L03311 Cellulitis of abdominal wall: Secondary | ICD-10-CM | POA: Diagnosis present

## 2018-12-30 DIAGNOSIS — G8253 Quadriplegia, C5-C7 complete: Secondary | ICD-10-CM | POA: Diagnosis present

## 2018-12-30 DIAGNOSIS — L89154 Pressure ulcer of sacral region, stage 4: Secondary | ICD-10-CM | POA: Diagnosis present

## 2018-12-30 DIAGNOSIS — B9620 Unspecified Escherichia coli [E. coli] as the cause of diseases classified elsewhere: Secondary | ICD-10-CM | POA: Diagnosis present

## 2018-12-30 DIAGNOSIS — Z881 Allergy status to other antibiotic agents status: Secondary | ICD-10-CM | POA: Diagnosis not present

## 2018-12-30 DIAGNOSIS — R05 Cough: Secondary | ICD-10-CM | POA: Diagnosis not present

## 2018-12-30 DIAGNOSIS — I9589 Other hypotension: Secondary | ICD-10-CM | POA: Diagnosis present

## 2018-12-30 DIAGNOSIS — L0291 Cutaneous abscess, unspecified: Secondary | ICD-10-CM | POA: Insufficient documentation

## 2018-12-30 DIAGNOSIS — L8994 Pressure ulcer of unspecified site, stage 4: Secondary | ICD-10-CM | POA: Diagnosis not present

## 2018-12-30 DIAGNOSIS — G8250 Quadriplegia, unspecified: Secondary | ICD-10-CM | POA: Diagnosis not present

## 2018-12-30 DIAGNOSIS — G4733 Obstructive sleep apnea (adult) (pediatric): Secondary | ICD-10-CM | POA: Diagnosis present

## 2018-12-30 DIAGNOSIS — K219 Gastro-esophageal reflux disease without esophagitis: Secondary | ICD-10-CM | POA: Diagnosis present

## 2018-12-30 DIAGNOSIS — E876 Hypokalemia: Secondary | ICD-10-CM | POA: Diagnosis present

## 2018-12-30 LAB — BPAM RBC
Blood Product Expiration Date: 202008182359
ISSUE DATE / TIME: 202008152317
Unit Type and Rh: 7300

## 2018-12-30 LAB — BASIC METABOLIC PANEL
Anion gap: 8 (ref 5–15)
BUN: 13 mg/dL (ref 6–20)
CO2: 22 mmol/L (ref 22–32)
Calcium: 7.7 mg/dL — ABNORMAL LOW (ref 8.9–10.3)
Chloride: 102 mmol/L (ref 98–111)
Creatinine, Ser: 0.4 mg/dL — ABNORMAL LOW (ref 0.61–1.24)
GFR calc Af Amer: >60 mL/min (ref >60)
GFR calc non Af Amer: >60 mL/min (ref >60)
Glucose, Bld: 100 mg/dL — ABNORMAL HIGH (ref 70–99)
Potassium: 3.4 mmol/L — ABNORMAL LOW (ref 3.5–5.1)
Sodium: 132 mmol/L — ABNORMAL LOW (ref 135–145)

## 2018-12-30 LAB — CBC
HCT: 25.2 % — ABNORMAL LOW (ref 39.0–52.0)
Hemoglobin: 8 g/dL — ABNORMAL LOW (ref 13.0–17.0)
MCH: 26.4 pg (ref 26.0–34.0)
MCHC: 31.7 g/dL (ref 30.0–36.0)
MCV: 83.2 fL (ref 80.0–100.0)
Platelets: 336 10*3/uL (ref 150–400)
RBC: 3.03 MIL/uL — ABNORMAL LOW (ref 4.22–5.81)
RDW: 15.4 % (ref 11.5–15.5)
WBC: 11.2 10*3/uL — ABNORMAL HIGH (ref 4.0–10.5)
nRBC: 0 % (ref 0.0–0.2)

## 2018-12-30 LAB — TYPE AND SCREEN
ABO/RH(D): B POS
Antibody Screen: NEGATIVE
Unit division: 0

## 2018-12-30 LAB — HIV ANTIBODY (ROUTINE TESTING W REFLEX): HIV Screen 4th Generation wRfx: NONREACTIVE

## 2018-12-30 LAB — MAGNESIUM: Magnesium: 1.3 mg/dL — ABNORMAL LOW (ref 1.7–2.4)

## 2018-12-30 LAB — SARS CORONAVIRUS 2 (TAT 6-24 HRS): SARS Coronavirus 2: NEGATIVE

## 2018-12-30 MED ORDER — RISAQUAD PO CAPS
2.0000 | ORAL_CAPSULE | Freq: Every day | ORAL | Status: DC
  Administered 2018-12-30 – 2019-01-04 (×6): 2 via ORAL
  Filled 2018-12-30 (×6): qty 2

## 2018-12-30 MED ORDER — VANCOMYCIN HCL IN DEXTROSE 1-5 GM/200ML-% IV SOLN
1000.0000 mg | INTRAVENOUS | Status: AC
  Administered 2018-12-31 – 2019-01-04 (×5): 1000 mg via INTRAVENOUS
  Filled 2018-12-30 (×5): qty 200

## 2018-12-30 MED ORDER — VANCOMYCIN HCL 10 G IV SOLR
1500.0000 mg | Freq: Once | INTRAVENOUS | Status: AC
  Administered 2018-12-30: 1500 mg via INTRAVENOUS
  Filled 2018-12-30: qty 1500

## 2018-12-30 MED ORDER — MAGNESIUM SULFATE 4 GM/100ML IV SOLN
4.0000 g | Freq: Once | INTRAVENOUS | Status: AC
  Administered 2018-12-30: 4 g via INTRAVENOUS
  Filled 2018-12-30: qty 100

## 2018-12-30 MED ORDER — SODIUM CHLORIDE 0.9 % IV SOLN
2.0000 g | INTRAVENOUS | Status: DC
  Administered 2018-12-30 – 2019-01-01 (×3): 2 g via INTRAVENOUS
  Filled 2018-12-30 (×3): qty 2

## 2018-12-30 MED ORDER — SODIUM CHLORIDE 0.9% FLUSH
10.0000 mL | INTRAVENOUS | Status: DC | PRN
  Administered 2018-12-30: 10 mL
  Filled 2018-12-30: qty 40

## 2018-12-30 NOTE — Progress Notes (Signed)
2210 Received pt from ED, AOX4 with L ostomy and suprapubic foley in place leaking (per pt it's already leaking prior to admission). Vital signs taken by NT: Temp= 100.1, BP=85/61 (69), PR= 111, RR= 18, 02= 99%. Oriented to room, bed controls and plan of care. Assessed and changed foam dressings in wounds in the abdomen and bilateral extremities. Assesed sacral wound stage 4, dressing in place.   2349 Pt hypotensive with BP: 74/44, Called Rapid Response Team and MD on call prior to giving 1 unit of blood. VSS, will continue to monitor.   0215 Wound collection done prior to giving antibiotics. IVBlood transfusion completion. VSS. Paged MD regarding suprapubic catheter leakage. Packed with IV drain sponges to control leakage for now, MD made aware and agreed upon intervention. Will continue to monitor.

## 2018-12-30 NOTE — Progress Notes (Addendum)
Pharmacy Antibiotic Note  [name] [name] is a 53 y.o. male admitted on 12/29/2018 with abdominal abcess/cellulitis.  Pharmacy has been consulted for Vancomycin dosing.  Plan: Vancomycin 1500 mg IV now, then 1 g IV q24h     Temp (24hrs), Avg:99.5 F (37.5 C), Min:98.6 F (37 C), Max:100.1 F (37.8 C)  Recent Labs  Lab 12/29/18 1537  WBC 11.0*  CREATININE 0.49*  LATICACIDVEN 1.7    CrCl cannot be calculated (Unknown ideal weight.).    Allergies  Allergen Reactions  . Ferumoxytol Anaphylaxis and Other (See Comments)     SYNCOPE; patient tolerated venofer 10/01/18 s rxn   . Oxybutynin Other (See Comments)    Hallucinations    . Vancomycin Other (See Comments)    ARF 05-2016 -- affects kidneys     Caryl Pina 12/30/2018 3:25 AM

## 2018-12-30 NOTE — Significant Event (Signed)
Rapid Response Event Note  Overview: Pt hypotensive before receiving blood. Time Called: 2349 Arrival Time: 0000 Event Type: Hypotension  Initial Focused Assessment: Pt A&O, listening to music, skin cool and dry. T 99.2, HR 102, RR 18, BP 74/44, SpO2 99% on RA.   Interventions: Directed to give previously ordered blood, assisted with blood verification.  Plan of Care (if not transferred): Give previously ordered blood and reassess after transfusion, call RRT for any further assistance.  Event Summary:   Pt BP 15 min after start of transfusion 86/41    Event End Time: [place]

## 2018-12-30 NOTE — Progress Notes (Signed)
Subjective/Chief Complaint: No complaints   Objective: Vital signs in last 24 hours: Temp:  [98.6 F (37 C)-100.1 F (37.8 C)] 99.2 F (37.3 C) (08/16 0821) Pulse Rate:  [94-111] 94 (08/16 0821) Resp:  [14-20] 18 (08/16 0821) BP: (74-135)/(41-72) 83/50 (08/16 0821) SpO2:  [96 %-100 %] 99 % (08/16 0821) Last BM Date: 12/30/18  Intake/Output from previous day: 08/15 0701 - 08/16 0700 In: 1203.3 [I.V.:36.2; Blood:945; IV Piggyback:222.2] Out: 1100 [Urine:900; Stool:200] Intake/Output this shift: No intake/output data recorded.  General appearance: alert and cooperative Resp: clear to auscultation bilaterally Cardio: regular rate and rhythm GI: soft, non-tender; bowel sounds normal; no masses,  no organomegaly Extremities: open wound right hip with moderate drainage  Lab Results:  Recent Labs    12/29/18 1537 12/30/18 0502  WBC 11.0* 11.2*  HGB 6.9* 8.0*  HCT 22.4* 25.2*  PLT 328 336   BMET Recent Labs    12/29/18 1537 12/30/18 0502  NA 133* 132*  K 3.7 3.4*  CL 104 102  CO2 21* 22  GLUCOSE 126* 100*  BUN 15 13  CREATININE 0.49* 0.40*  CALCIUM 7.9* 7.7*   PT/INR No results for input(s): LABPROT, INR in the last 72 hours. ABG No results for input(s): PHART, HCO3 in the last 72 hours.  Invalid input(s): PCO2, PO2  Studies/Results: Ct Abdomen Pelvis W Contrast  Result Date: 12/29/2018 CLINICAL DATA:  Abdominal distension. EXAM: CT ABDOMEN AND PELVIS WITH CONTRAST TECHNIQUE: Multidetector CT imaging of the abdomen and pelvis was performed using the standard protocol following bolus administration of intravenous contrast. CONTRAST:  169mL OMNIPAQUE IOHEXOL 300 MG/ML  SOLN COMPARISON:  December 13, 2018 FINDINGS: Lower chest: There are bibasilar airspace opacities.The heart size is normal. There is a trace to small pericardial effusion. Hepatobiliary: The liver is normal. Status post cholecystectomy.There is no biliary ductal dilation. Pancreas: Normal contours  without ductal dilatation. No peripancreatic fluid collection. Spleen: No splenic laceration or hematoma. Adrenals/Urinary Tract: --Adrenal glands: No adrenal hemorrhage. --Right kidney/ureter: No hydronephrosis or perinephric hematoma. --Left kidney/ureter: No hydronephrosis or perinephric hematoma. --Urinary bladder: The bladder is decompressed with a suprapubic catheter Stomach/Bowel: --Stomach/Duodenum: Postsurgical changes are again noted of the stomach. --Small bowel: There are few mildly dilated loops of small bowel without evidence for discrete transition point. --Colon: There is an end colostomy in the left lower quadrant. There is no evidence for a large bowel obstruction. --Appendix: The appendix is not reliably identified. Vascular/Lymphatic: There is a retroaortic left renal vein, a normal variant. There is no abdominal aortic aneurysm --No retroperitoneal lymphadenopathy. --No mesenteric lymphadenopathy. --there is some mildly enlarged pelvic lymph nodes bilaterally. Reproductive: Unremarkable Other: No ascites or free air. The abdominal wall is normal. Musculoskeletal. Again noted are chronic changes involving the pelvic bones bilaterally. There is a large decubitus ulcer overlying the proximal right femur. There is sclerosis of the visualized proximal right femur which is stable from prior study. There is sclerosis of the right iliac bone, similar to prior study. There is a 3.3 by 7.1 cm air and fluid collection along the patient's right flank. There is significant overlying skin thickening. The collection is approximately 1.3 cm deep to the skin surface. There is surrounding fat stranding and edema. IMPRESSION: 1. There is a 7.1 cm air in fluid collection along the patient's right flank concerning for an abscess. There is associated skin thickening which may be reactive or secondary to cellulitis. 2. Bibasilar airspace opacities which may represent chronic atelectasis, aspiration, or developing  infiltrates.  This is overall similar to prior CT dated December 13, 2018. 3. Stable appearance of the pelvic bones with a persistent large right-sided decubitus ulcer as detailed above. Electronically Signed   By: Constance Holster M.D.   On: 12/29/2018 17:31    Anti-infectives: Anti-infectives (From admission, onward)   Start     Dose/Rate Route Frequency Ordered Stop   12/31/18 0600  vancomycin (VANCOCIN) IVPB 1000 mg/200 mL premix     1,000 mg 200 mL/hr over 60 Minutes Intravenous Every 24 hours 12/30/18 0332     12/30/18 0400  vancomycin (VANCOCIN) 1,500 mg in sodium chloride 0.9 % 500 mL IVPB     1,500 mg 250 mL/hr over 120 Minutes Intravenous  Once 12/30/18 0332 12/30/18 0742   12/30/18 0230  clindamycin (CLEOCIN) IVPB 600 mg  Status:  Discontinued     600 mg 100 mL/hr over 30 Minutes Intravenous Every 8 hours 12/29/18 2020 12/29/18 2113   12/30/18 0200  cefTRIAXone (ROCEPHIN) 2 g in sodium chloride 0.9 % 100 mL IVPB     2 g 200 mL/hr over 30 Minutes Intravenous Every 24 hours 12/30/18 0157     12/29/18 2015  clindamycin (CLEOCIN) IVPB 600 mg  Status:  Discontinued     600 mg 100 mL/hr over 30 Minutes Intravenous  Once 12/29/18 2006 12/29/18 2020   12/29/18 1815  clindamycin (CLEOCIN) IVPB 600 mg     600 mg 100 mL/hr over 30 Minutes Intravenous  Once 12/29/18 1802 12/29/18 1851      Assessment/Plan: s/p * No surgery found * Continue IV abx  Start dressing changes today If drainage does not improve then he may need more extensive debridement Will follow  LOS: 0 days    Autumn Messing III 12/30/2018

## 2018-12-30 NOTE — Progress Notes (Signed)
   Subjective:  We met [name] [name] today at bedside. He states that he is feeling well today. He states that he has no pain where his abscess currently is located. He admits that he has no pain on the affected side to begin with. We discussed with him about packing his abscess and debriding it as necessary. We also discussed his antibiotic regime. He was agreeable to the plan. All questions and concerns were addressed.    Objective:  Vital signs in last 24 hours: Vitals:   12/30/18 0030 12/30/18 0215 12/30/18 0406 12/30/18 0821  BP: (!) 86/41 (!) 96/58 (!) 82/60 (!) 83/50  Pulse: (!) 101 95 (!) 105 94  Resp: '18 18 18 18  '$ Temp: 100.1 F (37.8 C) 99.1 F (37.3 C) 99.2 F (37.3 C) 99.2 F (37.3 C)  TempSrc: Oral Oral Oral Oral  SpO2: 100% 100% 98% 99%   Physical Exam Constitutional:      General: He is not in acute distress.    Appearance: He is obese. He is not ill-appearing, toxic-appearing or diaphoretic.  HENT:     Head: Normocephalic and atraumatic.  Cardiovascular:     Rate and Rhythm: Regular rhythm. Tachycardia present.     Pulses: Normal pulses.     Heart sounds: Normal heart sounds. No murmur.  Pulmonary:     Effort: Pulmonary effort is normal. No respiratory distress.     Breath sounds: No wheezing.  Abdominal:     General: Bowel sounds are normal.     Comments: Ostomy bag is clean with no erythema or drainage from the site.  Site of abscess is packed. There is no sign of drainage at this time.  Eschar is dressed with no leakage or purulence around the site.   Genitourinary:    Comments: Suprapubic catheter appears clean and dry. It is well packed from minor urinary leakage.  Neurological:     Mental Status: He is alert.     Assessment/Plan:  Active Problems:   Cellulitis of abdominal wall   Abscess  Abscess:  - WBC: 11.2 - Continue Vancomycin and Rocephin - Continue to follow wound and blood cultures - Continue to monitor for signs of infections.    Hypomagnesia:  - Mg: 1.3 - 4 units Mg Sulfate given.  Hypotension:  - Continue Midodrine   Dispo: Anticipated discharge pending medical course.   [name] Mercury, MD 12/30/2018, 1:46 PM Pager: [phone]

## 2018-12-31 DIAGNOSIS — L8994 Pressure ulcer of unspecified site, stage 4: Secondary | ICD-10-CM

## 2018-12-31 DIAGNOSIS — I959 Hypotension, unspecified: Secondary | ICD-10-CM

## 2018-12-31 DIAGNOSIS — G8220 Paraplegia, unspecified: Secondary | ICD-10-CM

## 2018-12-31 LAB — CBC
HCT: 24 % — ABNORMAL LOW (ref 39.0–52.0)
Hemoglobin: 7.7 g/dL — ABNORMAL LOW (ref 13.0–17.0)
MCH: 26.4 pg (ref 26.0–34.0)
MCHC: 32.1 g/dL (ref 30.0–36.0)
MCV: 82.2 fL (ref 80.0–100.0)
Platelets: 356 10*3/uL (ref 150–400)
RBC: 2.92 MIL/uL — ABNORMAL LOW (ref 4.22–5.81)
RDW: 15.9 % — ABNORMAL HIGH (ref 11.5–15.5)
WBC: 10.2 10*3/uL (ref 4.0–10.5)
nRBC: 0 % (ref 0.0–0.2)

## 2018-12-31 LAB — BASIC METABOLIC PANEL
Anion gap: 6 (ref 5–15)
BUN: 10 mg/dL (ref 6–20)
CO2: 23 mmol/L (ref 22–32)
Calcium: 7.4 mg/dL — ABNORMAL LOW (ref 8.9–10.3)
Chloride: 106 mmol/L (ref 98–111)
Creatinine, Ser: <0.3 mg/dL — ABNORMAL LOW (ref 0.61–1.24)
Glucose, Bld: 84 mg/dL (ref 70–99)
Potassium: 3.2 mmol/L — ABNORMAL LOW (ref 3.5–5.1)
Sodium: 135 mmol/L (ref 135–145)

## 2018-12-31 MED ORDER — FESOTERODINE FUMARATE ER 8 MG PO TB24
8.0000 mg | ORAL_TABLET | Freq: Every morning | ORAL | Status: DC
  Administered 2018-12-31 – 2019-01-04 (×5): 8 mg via ORAL
  Filled 2018-12-31 (×5): qty 1

## 2018-12-31 MED ORDER — DAKINS (1/4 STRENGTH) 0.125 % EX SOLN
Freq: Every day | CUTANEOUS | Status: AC
  Administered 2018-12-31 – 2019-01-02 (×3)
  Filled 2018-12-31: qty 473

## 2018-12-31 MED ORDER — POTASSIUM CHLORIDE CRYS ER 20 MEQ PO TBCR
40.0000 meq | EXTENDED_RELEASE_TABLET | Freq: Two times a day (BID) | ORAL | Status: AC
  Administered 2018-12-31 (×2): 40 meq via ORAL
  Filled 2018-12-31 (×2): qty 2

## 2018-12-31 MED ORDER — ENOXAPARIN SODIUM 40 MG/0.4ML ~~LOC~~ SOLN
40.0000 mg | SUBCUTANEOUS | Status: DC
  Administered 2018-12-31 – 2019-01-04 (×5): 40 mg via SUBCUTANEOUS
  Filled 2018-12-31 (×5): qty 0.4

## 2018-12-31 NOTE — Progress Notes (Signed)
[facility] Progress Note     Subjective: CC-  Comfortable. No complaints. Tolerating diet. Denies abdominal pain, nausea, vomiting. Having bowel function. WBC has normalized 10.2, TMAX 99.2  Objective: Vital signs in last 24 hours: Temp:  [98 F (36.7 C)-98.7 F (37.1 C)] 98 F (36.7 C) (08/17 0549) Pulse Rate:  [86-95] 86 (08/17 0549) Resp:  [17-18] 17 (08/17 0549) BP: (87-116)/(59-92) 112/92 (08/17 0549) SpO2:  [98 %-100 %] 100 % (08/17 0549) Weight:  [89.5 kg] 89.5 kg (08/16 2010) Last BM Date: 12/30/18  Intake/Output from previous day: 08/16 0701 - 08/17 0700 In: 67 [P.O.:720; IV Piggyback:100] Out: 2026 [Urine:2025; Stool:1] Intake/Output this shift: No intake/output data recorded.  PE: Gen:  Alert, NAD, pleasant Pulm:  Rate and effort normal Skin: warm and dry Abd: Soft, NT/ND, right lateral flank wounds seen below: more medial wound about 3x1x1cm and more lateral wound about 5x2x2, I debrided the fibrinous exudate and purulent fluid poured out; the two wounds were found to connect       Lab Results:  Recent Labs    12/30/18 0502 12/31/18 0407  WBC 11.2* 10.2  HGB 8.0* 7.7*  HCT 25.2* 24.0*  PLT 336 356   BMET Recent Labs    12/30/18 0502 12/31/18 0407  NA 132* 135  K 3.4* 3.2*  CL 102 106  CO2 22 23  GLUCOSE 100* 84  BUN 13 10  CREATININE 0.40* <0.30*  CALCIUM 7.7* 7.4*   PT/INR No results for input(s): LABPROT, INR in the last 72 hours. CMP     Component Value Date/Time   NA 135 12/31/2018 0407   NA 139 11/10/2016   NA 137 10/07/2014 1148   K 3.2 (L) 12/31/2018 0407   K 3.7 10/07/2014 1148   CL 106 12/31/2018 0407   CO2 23 12/31/2018 0407   CO2 22 10/07/2014 1148   GLUCOSE 84 12/31/2018 0407   GLUCOSE 117 10/07/2014 1148   BUN 10 12/31/2018 0407   BUN 10 11/10/2016   BUN 8.7 10/07/2014 1148   CREATININE <0.30 (L) 12/31/2018 0407   CREATININE 0.57 (L) 07/01/2015 1606   CREATININE 0.6 (L) 10/07/2014 1148   CALCIUM 7.4 (L) 12/31/2018 0407   CALCIUM 9.1 10/07/2014 1148   PROT 5.2 (L) 10/16/2018 0450   PROT 8.0 10/07/2014 1148   ALBUMIN 2.1 (L) 10/16/2018 0450   ALBUMIN 2.8 (L) 10/07/2014 1148   AST 15 10/16/2018 0450   AST 16 10/07/2014 1148   ALT 16 10/16/2018 0450   ALT 7 10/07/2014 1148   ALKPHOS 50 10/16/2018 0450   ALKPHOS 80 10/07/2014 1148   BILITOT 0.8 10/16/2018 0450   BILITOT <0.20 10/07/2014 1148   GFRNONAA NOT CALCULATED 12/31/2018 0407   GFRAA NOT CALCULATED 12/31/2018 0407   Lipase     Component Value Date/Time   LIPASE 36 10/14/2018 0150       Studies/Results: Ct Abdomen Pelvis W Contrast  Result Date: 12/29/2018 CLINICAL DATA:  Abdominal distension. EXAM: CT ABDOMEN AND PELVIS WITH CONTRAST TECHNIQUE: Multidetector CT imaging of the abdomen and pelvis was performed using the standard protocol following bolus administration of intravenous contrast. CONTRAST:  174mL OMNIPAQUE IOHEXOL 300 MG/ML  SOLN COMPARISON:  December 13, 2018 FINDINGS: Lower chest: There are bibasilar airspace opacities.The heart size is normal. There is a trace to small pericardial effusion. Hepatobiliary: The liver is normal. Status post cholecystectomy.There is no biliary ductal dilation. Pancreas: Normal contours without ductal dilatation. No peripancreatic fluid collection. Spleen: No splenic laceration or  hematoma. Adrenals/Urinary Tract: --Adrenal glands: No adrenal hemorrhage. --Right kidney/ureter: No hydronephrosis or perinephric hematoma. --Left kidney/ureter: No hydronephrosis or perinephric hematoma. --Urinary bladder: The bladder is decompressed with a suprapubic catheter Stomach/Bowel: --Stomach/Duodenum: Postsurgical changes are again noted of the stomach. --Small bowel: There are few mildly dilated loops of small bowel without evidence for discrete transition point. --Colon: There is an end colostomy in the left lower quadrant. There is no evidence for a large bowel obstruction. --Appendix: The  appendix is not reliably identified. Vascular/Lymphatic: There is a retroaortic left renal vein, a normal variant. There is no abdominal aortic aneurysm --No retroperitoneal lymphadenopathy. --No mesenteric lymphadenopathy. --there is some mildly enlarged pelvic lymph nodes bilaterally. Reproductive: Unremarkable Other: No ascites or free air. The abdominal wall is normal. Musculoskeletal. Again noted are chronic changes involving the pelvic bones bilaterally. There is a large decubitus ulcer overlying the proximal right femur. There is sclerosis of the visualized proximal right femur which is stable from prior study. There is sclerosis of the right iliac bone, similar to prior study. There is a 3.3 by 7.1 cm air and fluid collection along the patient's right flank. There is significant overlying skin thickening. The collection is approximately 1.3 cm deep to the skin surface. There is surrounding fat stranding and edema. IMPRESSION: 1. There is a 7.1 cm air in fluid collection along the patient's right flank concerning for an abscess. There is associated skin thickening which may be reactive or secondary to cellulitis. 2. Bibasilar airspace opacities which may represent chronic atelectasis, aspiration, or developing infiltrates. This is overall similar to prior CT dated December 13, 2018. 3. Stable appearance of the pelvic bones with a persistent large right-sided decubitus ulcer as detailed above. Electronically Signed   By: Constance Holster M.D.   On: 12/29/2018 17:31    Anti-infectives: Anti-infectives (From admission, onward)   Start     Dose/Rate Route Frequency Ordered Stop   12/31/18 0600  vancomycin (VANCOCIN) IVPB 1000 mg/200 mL premix     1,000 mg 200 mL/hr over 60 Minutes Intravenous Every 24 hours 12/30/18 0332     12/30/18 0400  vancomycin (VANCOCIN) 1,500 mg in sodium chloride 0.9 % 500 mL IVPB     1,500 mg 250 mL/hr over 120 Minutes Intravenous  Once 12/30/18 0332 12/30/18 0742   12/30/18  0230  clindamycin (CLEOCIN) IVPB 600 mg  Status:  Discontinued     600 mg 100 mL/hr over 30 Minutes Intravenous Every 8 hours 12/29/18 2020 12/29/18 2113   12/30/18 0200  cefTRIAXone (ROCEPHIN) 2 g in sodium chloride 0.9 % 100 mL IVPB     2 g 200 mL/hr over 30 Minutes Intravenous Every 24 hours 12/30/18 0157     12/29/18 2015  clindamycin (CLEOCIN) IVPB 600 mg  Status:  Discontinued     600 mg 100 mL/hr over 30 Minutes Intravenous  Once 12/29/18 2006 12/29/18 2020   12/29/18 1815  clindamycin (CLEOCIN) IVPB 600 mg     600 mg 100 mL/hr over 30 Minutes Intravenous  Once 12/29/18 1802 12/29/18 1851       Assessment/Plan Hx OSA CKD Neurogenic bladder with chronic suprapubic catheter Quadriplegia Chronic sacral decubitus with osteomyelitis  Right superficial lower abdominal abscess with spontaneous drainage - Wounds were further debrided this morning (see pictures above) and the two openings connect. Continue daily packing with saline dampened kerlex.   ID - currently rocephin 8/16>>, vancomycin 8/16>>. WBC normalized 10.2, TMAX 99.2 FEN - reg diet VTE - SCD, per  primary/ok for chemical DVT prophylaxis from surgical standpoint Foley - SP catheter   LOS: 1 day    Wellington Hampshire , [facility] 12/31/2018, 10:28 AM Pager: [phone] Mon-Thurs 7:00 am-4:30 pm Fri 7:00 am -11:30 AM Sat-Sun 7:00 am-11:30 am

## 2018-12-31 NOTE — Consult Note (Signed)
[place] Nurse wound consult note  Reason for Consult:Chronic full thickness pressure inuries to right posterior flank, Sacrum, buttocks and bilateral ischial tuberosities. Left hip and scattered nonhealing abrasions to feet and lower legs.  Wound type:Nonhealing pressure injuries Pressure Injury POA: Yes/No/NA Measurement:Right thoracic area Stage 4: 12cm x 4cm x 0.4cm with 80% red, 20 % yellow tissue. Minimal serosanguinous Sacrum/bilateral ischial tuberosity/perineal area and scrotum Stage 4: 80% healed with pink epithelialized tissue. Twenty percent (20%) surface wounds in this Stage 4 pressure injury. Irregularly shaped wound measuring approximately 22cm x 30cm x 0.2cm. Friable. 1 cm patch of eschar at 7 o'clock. Increased drainage and odor present.  Left hip Stage 3 Wound:7cm x 6cm x 0.8cm with 80% pink, 20% slough from 12-3 o' clock. Undermining measuring 1.5cm from 9-12 o'clock. Strong odor and moderate exudate noted upon dressing removal.  Right flank with new abscess and surrounding cellulitis.  Proximal opening 1 cm x 1 cm x 1 cm, packed with iodoform gauze. Distal opening has 2 cm x 2 cm opening with adherent slough. Surgery has consulted and may debride if needed.  Would like to initiate 3 days of Dakins solution to sacral wound and distal end of abscess to debride and provide antimicrobial topical therapy to colonized chronic sacral wound.  Will discontinue this prior to discharge.  Wound bed:As described above Drainage (amount, consistency, odor)Moderate serosanguinous  Musty odor Dressings saturated upon my assessment. Periwound:intact, dry and with evidence of previous wound healing Dressing procedure/placement/frequency:Dakins daily dressing.  Pack Proximal right flank opening with Iodoform. Turn and reposition every two hours.  Float heels.  They are currently intact   A mattress replacement with low air loss feature   WOC Nurse ostomy consultnote Stoma  type/location:LLQ colostomy (long standing) Brown stool present in pouch.  Stomal assessment/size:1 and 5/8 inches round, slightly raised, pale pink and moist. Peristomal assessment:Not seen today, pouching system applied yesterday is intact and patient changes one time weekly. Treatment options for stomal/peristomal skin:N/A Output: Brown stool, soft Ostomy pouching: 2pc., 2 and 3/4 inch pouching system.  Education provided:None required. Bedside RN will provide wound care.  Will transition back to silver hydrofiber (Aquacel AG ) daily after Dakins trial.   WOC team will monitor weekly.  Domenic Moras MSN, RN, FNP-BC CWON Wound, Ostomy, Continence Nurse Pager [phone]
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
## 2019-01-01 DIAGNOSIS — Z1624 Resistance to multiple antibiotics: Secondary | ICD-10-CM

## 2019-01-01 DIAGNOSIS — G8250 Quadriplegia, unspecified: Secondary | ICD-10-CM

## 2019-01-01 DIAGNOSIS — L02211 Cutaneous abscess of abdominal wall: Secondary | ICD-10-CM | POA: Diagnosis present

## 2019-01-01 DIAGNOSIS — Z881 Allergy status to other antibiotic agents status: Secondary | ICD-10-CM

## 2019-01-01 DIAGNOSIS — Z1612 Extended spectrum beta lactamase (ESBL) resistance: Secondary | ICD-10-CM

## 2019-01-01 DIAGNOSIS — Z87440 Personal history of urinary (tract) infections: Secondary | ICD-10-CM

## 2019-01-01 DIAGNOSIS — Z872 Personal history of diseases of the skin and subcutaneous tissue: Secondary | ICD-10-CM

## 2019-01-01 DIAGNOSIS — B9620 Unspecified Escherichia coli [E. coli] as the cause of diseases classified elsewhere: Secondary | ICD-10-CM

## 2019-01-01 DIAGNOSIS — Z888 Allergy status to other drugs, medicaments and biological substances status: Secondary | ICD-10-CM

## 2019-01-01 DIAGNOSIS — Z95828 Presence of other vascular implants and grafts: Secondary | ICD-10-CM

## 2019-01-01 LAB — AEROBIC CULTURE W GRAM STAIN (SUPERFICIAL SPECIMEN)

## 2019-01-01 LAB — BASIC METABOLIC PANEL
Anion gap: 6 (ref 5–15)
BUN: 8 mg/dL (ref 6–20)
CO2: 23 mmol/L (ref 22–32)
Calcium: 7.7 mg/dL — ABNORMAL LOW (ref 8.9–10.3)
Chloride: 107 mmol/L (ref 98–111)
Creatinine, Ser: <0.3 mg/dL — ABNORMAL LOW (ref 0.61–1.24)
Glucose, Bld: 90 mg/dL (ref 70–99)
Potassium: 4.4 mmol/L (ref 3.5–5.1)
Sodium: 136 mmol/L (ref 135–145)

## 2019-01-01 LAB — MAGNESIUM: Magnesium: 1.5 mg/dL — ABNORMAL LOW (ref 1.7–2.4)

## 2019-01-01 LAB — MRSA PCR SCREENING: MRSA by PCR: POSITIVE — AB

## 2019-01-01 MED ORDER — MAGNESIUM SULFATE 2 GM/50ML IV SOLN
2.0000 g | Freq: Once | INTRAVENOUS | Status: AC
  Administered 2019-01-01: 2 g via INTRAVENOUS
  Filled 2019-01-01: qty 50

## 2019-01-01 MED ORDER — SODIUM CHLORIDE 0.9 % IV SOLN
1.0000 g | Freq: Three times a day (TID) | INTRAVENOUS | Status: DC
  Administered 2019-01-01 – 2019-01-04 (×9): 1 g via INTRAVENOUS
  Filled 2019-01-01 (×12): qty 1

## 2019-01-01 NOTE — Progress Notes (Signed)
CRITICAL VALUE ALERT  Critical Value:  MRSA + nares  Date & Time Notied:  01/01/2019 at 1628   Provider Notified: Dr. Gilford Rile   Orders Received/Actions taken: Patient on contact isolation. No new orders at this time. Will continue to monitor.
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
## 2019-01-01 NOTE — Progress Notes (Signed)
Pharmacy Antibiotic Note  [name] [name] is a 53 y.o. male admitted on 12/29/2018 with a large abdominal wall abscess. A culture of the abscess is showing ESBL E coli.  Pharmacy has been consulted for meropenem dosing. WBC is 10.2 and patient afebrile. Noted that patient is quadriplegic so creatinine likely falsely low.    Plan: Meropenem 1 gm every 8 hours Monitor for clinical improvement   Height: 6' 0.01" (182.9 cm) Weight: 197 lb 5 oz (89.5 kg) IBW/kg (Calculated) : 77.62  Temp (24hrs), Avg:98.6 F (37 C), Min:98.5 F (36.9 C), Max:98.7 F (37.1 C)  Recent Labs  Lab 12/29/18 1537 12/30/18 0502 12/31/18 0407 01/01/19 0405  WBC 11.0* 11.2* 10.2  --   CREATININE 0.49* 0.40* <0.30* <0.30*  LATICACIDVEN 1.7  --   --   --     CrCl cannot be calculated (This lab value cannot be used to calculate CrCl because it is not a number: <0.30).    Allergies  Allergen Reactions  . Ferumoxytol Anaphylaxis and Other (See Comments)     SYNCOPE; patient tolerated venofer 10/01/18 s rxn   . Oxybutynin Other (See Comments)    Hallucinations    . Vancomycin Other (See Comments)    ARF 05-2016 -- affects kidneys     Antimicrobials this admission: Vanc 8/16>> Ceftriaxone 8/16>> 8/18 Meropenem 8/18>>    Thank you for allowing pharmacy to be a part of this patient's care.  Jimmy Footman, PharmD, BCPS, [place] [facility] Diseases Clinical Pharmacist Phone: [phone] 01/01/2019 1:09 PM
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
## 2019-01-02 DIAGNOSIS — Z22322 Carrier or suspected carrier of Methicillin resistant Staphylococcus aureus: Secondary | ICD-10-CM

## 2019-01-02 DIAGNOSIS — Z885 Allergy status to narcotic agent status: Secondary | ICD-10-CM

## 2019-01-02 LAB — CBC
HCT: 26.3 % — ABNORMAL LOW (ref 39.0–52.0)
Hemoglobin: 8.2 g/dL — ABNORMAL LOW (ref 13.0–17.0)
MCH: 26.3 pg (ref 26.0–34.0)
MCHC: 31.2 g/dL (ref 30.0–36.0)
MCV: 84.3 fL (ref 80.0–100.0)
Platelets: 409 10*3/uL — ABNORMAL HIGH (ref 150–400)
RBC: 3.12 MIL/uL — ABNORMAL LOW (ref 4.22–5.81)
RDW: 15.9 % — ABNORMAL HIGH (ref 11.5–15.5)
WBC: 10.9 10*3/uL — ABNORMAL HIGH (ref 4.0–10.5)
nRBC: 0 % (ref 0.0–0.2)

## 2019-01-02 LAB — MAGNESIUM: Magnesium: 1.7 mg/dL (ref 1.7–2.4)

## 2019-01-02 NOTE — Progress Notes (Signed)
Patient ID: [name] [name], male   DOB: [birth year], 53 y.o.   MRN: [id number]         [facility] for Infectious Disease  Date of Admission:  12/29/2018           Day 5 vancomycin        Day 2 meropenem ASSESSMENT: He tells me that he is having less drainage from his flank wound.  Continuing vancomycin for 2 more days and meropenem for 5 more days.  PLAN: 1. Continue current antibiotics  Principal Problem:   Abdominal wall abscess Active Problems:   Sacral decubitus ulcer, stage IV ([name]   S/P colostomy ([name]   Normocytic anemia   Hypokalemia   Gastroparesis   Quadriplegia, C5-C7, complete ([name]   Hypomagnesemia   Scheduled Meds: . sodium chloride   Intravenous Once  . acidophilus  2 capsule Oral Daily  . enoxaparin (LOVENOX) injection  40 mg Subcutaneous Q24H  . fesoterodine  8 mg Oral q morning - 10a  . pantoprazole  40 mg Oral BID  . sodium hypochlorite   Irrigation Q1200  . sucralfate  1 g Oral TID AC & HS   Continuous Infusions: . meropenem (MERREM) IV 1 g (01/02/19 0516)  . vancomycin 1,000 mg (01/02/19 0515)   PRN Meds:.ondansetron, sodium chloride flush   SUBJECTIVE: He is feeling about the same.  He was unaware that he had developed a right flank abscess prior to coming into the hospital.  Review of Systems: Review of Systems  Constitutional: Negative for chills and fever.    Allergies  Allergen Reactions  . Ferumoxytol Anaphylaxis and Other (See Comments)     SYNCOPE; patient tolerated venofer 10/01/18 s rxn   . Oxybutynin Other (See Comments)    Hallucinations    . Vancomycin Other (See Comments)    ARF 05-2016 -- affects kidneys     OBJECTIVE: Vitals:   01/01/19 0456 01/01/19 1628 01/01/19 2244 01/02/19 0517  BP: 101/82 114/69 104/68 98/68  Pulse: 91 89 99 97  Resp: 16 16 16    Temp: 98.5 F (36.9 C) 98.8 F (37.1 C) 98.4 F (36.9 C) 98.1 F (36.7 C)  TempSrc: Oral Oral Oral Oral  SpO2: 94% 100% 100% 100%  Weight:      Height:        Body mass index is 26.75 kg/m.  Physical Exam Constitutional:      Comments: He is resting quietly in bed watching a basketball game on television.  Abdominal:     Comments: He has a clean dressing over his right flank wound.     Lab Results Lab Results  Component Value Date   WBC 10.9 (H) 01/02/2019   HGB 8.2 (L) 01/02/2019   HCT 26.3 (L) 01/02/2019   MCV 84.3 01/02/2019   PLT 409 (H) 01/02/2019    Lab Results  Component Value Date   CREATININE <0.30 (L) 01/01/2019   BUN 8 01/01/2019   NA 136 01/01/2019   K 4.4 01/01/2019   CL 107 01/01/2019   CO2 23 01/01/2019    Lab Results  Component Value Date   ALT 16 10/16/2018   AST 15 10/16/2018   ALKPHOS 50 10/16/2018   BILITOT 0.8 10/16/2018     Microbiology: Recent Results (from the past 240 hour(s))  Culture, blood (Routine X 2) w Reflex to ID Panel     Status: None (Preliminary result)   Collection Time: 12/29/18  3:37 PM   Specimen: BLOOD  Result Value  Ref Range Status   Specimen Description BLOOD RIGHT PORTA CATH  Final   Special Requests   Final    BOTTLES DRAWN AEROBIC AND ANAEROBIC Blood Culture adequate volume   Culture   Final    NO GROWTH 4 DAYS Performed at [facility], [address], [place], [place] [address]    Report Status PENDING  Incomplete  SARS CORONAVIRUS 2 Nasal Swab Aptima Multi Swab     Status: None   Collection Time: 12/29/18  7:01 PM   Specimen: Aptima Multi Swab; Nasal Swab  Result Value Ref Range Status   SARS Coronavirus 2 NEGATIVE NEGATIVE Final    Comment: (NOTE) SARS-CoV-2 target nucleic acids are NOT DETECTED. The SARS-CoV-2 RNA is generally detectable in upper and lower respiratory specimens during the acute phase of infection. Negative results do not preclude SARS-CoV-2 infection, do not rule out co-infections with other pathogens, and should not be used as the sole basis for treatment or other patient management decisions. Negative results must be combined  with clinical observations, patient history, and epidemiological information. The expected result is Negative. Fact Sheet for Patients: SugarRoll.be Fact Sheet for Healthcare Providers: https://www.woods-mathews.com/ This test is not yet approved or cleared by the [place] FDA and  has been authorized for detection and/or diagnosis of SARS-CoV-2 by FDA under an Emergency Use Authorization (EUA). This EUA will remain  in effect (meaning this test can be used) for the duration of the COVID-19 declaration under Section 56 4(b)(1) of the Act, 21 U.S.C. section 360bbb-3(b)(1), unless the authorization is terminated or revoked sooner. Performed at [facility], [place] [address] [place], [place] [address]   Aerobic Culture (superficial specimen)     Status: None   Collection Time: 12/30/18  2:13 AM   Specimen: Wound  Result Value Ref Range Status   Specimen Description WOUND ABDOMEN  Final   Special Requests NONE  Final   Gram Stain   Final    RARE WBC PRESENT, PREDOMINANTLY PMN FEW GRAM POSITIVE COCCI IN CLUSTERS RARE GRAM NEGATIVE RODS Performed at [facility], [address] [place], [place] [address]    Culture   Final    MODERATE ESCHERICHIA COLI Confirmed Extended Spectrum Beta-Lactamase Producer (ESBL).  In bloodstream infections from ESBL organisms, carbapenems are preferred over piperacillin/tazobactam. They are shown to have a lower risk of mortality.    Report Status 01/01/2019 FINAL  Final   Organism ID, Bacteria ESCHERICHIA COLI  Final      Susceptibility   Escherichia coli - MIC*    AMPICILLIN >=32 RESISTANT Resistant     CEFAZOLIN >=64 RESISTANT Resistant     CEFEPIME RESISTANT Resistant     CEFTAZIDIME RESISTANT Resistant     CEFTRIAXONE >=64 RESISTANT Resistant     CIPROFLOXACIN >=4 RESISTANT Resistant     GENTAMICIN >=16 RESISTANT Resistant     IMIPENEM <=0.25 SENSITIVE Sensitive     TRIMETH/SULFA <=20  SENSITIVE Sensitive     AMPICILLIN/SULBACTAM >=32 RESISTANT Resistant     PIP/TAZO <=4 SENSITIVE Sensitive     Extended ESBL POSITIVE Resistant     * MODERATE ESCHERICHIA COLI  Culture, blood (Routine X 2) w Reflex to ID Panel     Status: None (Preliminary result)   Collection Time: 12/30/18 10:38 AM   Specimen: BLOOD RIGHT HAND  Result Value Ref Range Status   Specimen Description BLOOD RIGHT HAND  Final   Special Requests   Final    BOTTLES  DRAWN AEROBIC ONLY Blood Culture results may not be optimal due to an inadequate volume of blood received in culture bottles   Culture   Final    NO GROWTH 3 DAYS Performed at [facility], [place] [address], [place], [place] [address]    Report Status PENDING  Incomplete  MRSA PCR Screening     Status: Abnormal   Collection Time: 01/01/19  1:07 PM   Specimen: Nasopharyngeal  Result Value Ref Range Status   MRSA by PCR POSITIVE (A) NEGATIVE Final    Comment:        The GeneXpert MRSA Assay (FDA approved for NASAL specimens only), is one component of a comprehensive MRSA colonization surveillance program. It is not intended to diagnose MRSA infection nor to guide or monitor treatment for MRSA infections. RESULT CALLED TO, READ BACK BY AND VERIFIED WITH: Buelah Manis RN [id number] 01/01/19 A BROWNING Performed at [facility], [place] [address], [place],  [address]     Michel Bickers, [place] for Infectious [facility] [phone] pager   [phone] cell 01/02/2019, 11:26 AM
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
## 2019-01-02 NOTE — Progress Notes (Signed)
    CC: Sacral decubitus.abdominal wound  Subjective: STABLE, no real change.  Objective: Vital signs in last 24 hours: Temp:  [98.1 F (36.7 C)-98.8 F (37.1 C)] 98.1 F (36.7 C) (08/19 0517) Pulse Rate:  [89-99] 97 (08/19 0517) Resp:  [16] 16 (08/18 2244) BP: (98-114)/(68-69) 98/68 (08/19 0517) SpO2:  [100 %] 100 % (08/19 0517) Last BM Date: 01/01/19   480 p.o. 231 IV 1216 urine Stool 1000 Afebrile vital signs are stable WBC 10.9   Intake/Output from previous day: 08/18 0701 - 08/19 0700 In: 711.1 [P.O.:480; IV Piggyback:231.1] Out: 2260 [Urine:1260; Stool:1000] Intake/Output this shift: Total I/O In: 300 [P.O.:300] Out: -   General appearance: alert, cooperative and no distress picture below  Abdominal wounds are clean also   Area at 9 o'clock debride with scissors.  The area at [place] has a pocket in it that I have ask them to pack/wet to dry dressing for the remainder of the wound.  Lab Results:  Recent Labs    12/31/18 0407 01/02/19 0320  WBC 10.2 10.9*  HGB 7.7* 8.2*  HCT 24.0* 26.3*  PLT 356 409*    BMET Recent Labs    12/31/18 0407 01/01/19 0405  NA 135 136  K 3.2* 4.4  CL 106 107  CO2 23 23  GLUCOSE 84 90  BUN 10 8  CREATININE <0.30* <0.30*  CALCIUM 7.4* 7.7*   PT/INR No results for input(s): LABPROT, INR in the last 72 hours.  No results for input(s): AST, ALT, ALKPHOS, BILITOT, PROT, ALBUMIN in the last 168 hours.   Lipase     Component Value Date/Time   LIPASE 36 10/14/2018 0150     Medications: . sodium chloride   Intravenous Once  . acidophilus  2 capsule Oral Daily  . enoxaparin (LOVENOX) injection  40 mg Subcutaneous Q24H  . fesoterodine  8 mg Oral q morning - 10a  . pantoprazole  40 mg Oral BID  . sodium hypochlorite   Irrigation Q1200  . sucralfate  1 g Oral TID AC & HS    Assessment/Plan Hx OSA CKD Neurogenic bladder with chronic suprapubic catheter Quadriplegia Chronic sacral decubitus with  osteomyelitis  Right superficial lower abdominal abscess with spontaneous drainage - Wounds were further debrided this morning (see pictures above) and the two openings connect. Continue daily packing with saline dampened kerlex.   ID - currently rocephin 8/16>>, vancomycin 8/16>>. WBC normalized 10.2, TMAX 99.2 FEN - reg diet VTE - SCD, per primary/ok for chemical DVT prophylaxis from surgical standpoint Foley - SP catheter  Plan:  Continue local wound care.  LOS: 3 days    [name] [name] 01/02/2019 [phone]

## 2019-01-02 NOTE — TOC Progression Note (Signed)
Transition of Care [facility]) - Progression Note    Patient Details  Name: [name] [name] MRN: [id number] Date of Birth: 13-Apr-1966  Transition of Care [facility]) CM/SW [place], [place] Phone Number: 01/02/2019, 12:23 PM  Clinical Narrative:    CSW called Mateo Flow again with [place] regarding which services pt is active with; at this time [place] busy, she will check with Advanced office and get back with CSW.    Expected Discharge Plan: [place] Barriers to Discharge: Continued Medical Work up  Expected Discharge Plan and Services Expected Discharge Plan: [place] In-house Referral: Clinical Social Work Discharge Planning Services: CM Consult Post Acute Care Choice: Resumption of Svcs/PTA Provider Living arrangements for the past 2 months: Single Family Home Date DME Agency Contacted: 01/01/19 Time DME Agency Contacted: 1216 Representative spoke with at DME Agency: [place] ([place]) Interventions    Readmission Risk Interventions Readmission Risk Prevention Plan 10/15/2018  Transportation Screening Complete  PCP or Specialist Appt within 3-5 Days Not Complete  Not Complete comments not yet ready for d/c  HRI or [place] Complete  Social Work Consult for [place] Planning/Counseling Complete  Palliative Care Screening Not Applicable  Medication Review Press [employer]) Complete  Some recent data might be hidden

## 2019-01-03 ENCOUNTER — Inpatient Hospital Stay (HOSPITAL_COMMUNITY): Payer: Medicare Other

## 2019-01-03 DIAGNOSIS — R0600 Dyspnea, unspecified: Secondary | ICD-10-CM

## 2019-01-03 DIAGNOSIS — R0602 Shortness of breath: Secondary | ICD-10-CM

## 2019-01-03 DIAGNOSIS — R05 Cough: Secondary | ICD-10-CM

## 2019-01-03 LAB — BASIC METABOLIC PANEL
Anion gap: 7 (ref 5–15)
BUN: 5 mg/dL — ABNORMAL LOW (ref 6–20)
CO2: 24 mmol/L (ref 22–32)
Calcium: 7.9 mg/dL — ABNORMAL LOW (ref 8.9–10.3)
Chloride: 106 mmol/L (ref 98–111)
Creatinine, Ser: <0.3 mg/dL — ABNORMAL LOW (ref 0.61–1.24)
Glucose, Bld: 76 mg/dL (ref 70–99)
Potassium: 3.5 mmol/L (ref 3.5–5.1)
Sodium: 137 mmol/L (ref 135–145)

## 2019-01-03 LAB — CULTURE, BLOOD (ROUTINE X 2)
Culture: NO GROWTH
Special Requests: ADEQUATE

## 2019-01-03 LAB — TROPONIN I (HIGH SENSITIVITY): Troponin I (High Sensitivity): 3 ng/L (ref <18)

## 2019-01-03 MED ORDER — ALBUTEROL SULFATE (2.5 MG/3ML) 0.083% IN NEBU: 3.0000 mL | INHALATION_SOLUTION | Freq: Four times a day (QID) | RESPIRATORY_TRACT | Status: DC | PRN

## 2019-01-03 MED ORDER — ADULT MULTIVITAMIN W/MINERALS CH
1.0000 | ORAL_TABLET | Freq: Every day | ORAL | Status: DC
  Administered 2019-01-03 – 2019-01-04 (×2): 1 via ORAL
  Filled 2019-01-03 (×2): qty 1

## 2019-01-03 MED ORDER — MORPHINE SULFATE (PF) 2 MG/ML IV SOLN
2.0000 mg | INTRAVENOUS | Status: DC | PRN
  Filled 2019-01-03: qty 1

## 2019-01-03 MED ORDER — CHLORHEXIDINE GLUCONATE CLOTH 2 % EX PADS
6.0000 | MEDICATED_PAD | Freq: Every day | CUTANEOUS | Status: DC
  Administered 2019-01-04: 6 via TOPICAL

## 2019-01-03 MED ORDER — MUPIROCIN 2 % EX OINT
1.0000 "application " | TOPICAL_OINTMENT | Freq: Two times a day (BID) | CUTANEOUS | Status: DC
  Administered 2019-01-03 – 2019-01-04 (×3): 1 via NASAL
  Filled 2019-01-03 (×2): qty 22

## 2019-01-03 MED ORDER — JUVEN PO PACK
1.0000 | PACK | Freq: Two times a day (BID) | ORAL | Status: DC
  Administered 2019-01-03 – 2019-01-04 (×3): 1 via ORAL
  Filled 2019-01-03 (×3): qty 1

## 2019-01-03 MED ORDER — GUAIFENESIN ER 600 MG PO TB12
1200.0000 mg | ORAL_TABLET | Freq: Two times a day (BID) | ORAL | Status: DC
  Administered 2019-01-03 – 2019-01-04 (×4): 1200 mg via ORAL
  Filled 2019-01-03 (×4): qty 2

## 2019-01-03 MED ORDER — SODIUM CHLORIDE 0.9 % IV BOLUS
500.0000 mL | Freq: Once | INTRAVENOUS | Status: AC
  Administered 2019-01-03: 12:00:00 500 mL via INTRAVENOUS

## 2019-01-03 MED ORDER — PRO-STAT SUGAR FREE PO LIQD
30.0000 mL | Freq: Two times a day (BID) | ORAL | Status: DC
  Administered 2019-01-03 – 2019-01-04 (×3): 30 mL via ORAL
  Filled 2019-01-03 (×3): qty 30

## 2019-01-03 NOTE — Care Management (Signed)
Notified by [name] RN [facility] infusions that patient is active w [facility] and will need IV Abx at DC. Patient is currently on [place]

## 2019-01-03 NOTE — Progress Notes (Signed)
Paged to bedside about chest pressure. Presented to bedside for further evaluation. Patient states that the chest pressure started at 7am. Does not radiate anywhere. Accompanied by a cough. Never had pressure like this before. Vital signs stable compared to prior, with the exception of some tachycardia. Patient was placed on 2L/min [place] for comfort, not for hypoxia. Patient is actively coughing, sounds wet but too weak to get sputum up. Pulmonary exam significant for rhonchi and wheezing throughout.   Plan: - CXR - Mucinex  - Telemetry - EKG - Flutter valve   Ina Homes, MD IMTS PGY3 Pager: [phone]

## 2019-01-03 NOTE — Progress Notes (Addendum)
Patient  Right tunneling wound abdominal dressing removed, cleansed wound with sterile saline, packed with iodoform gauze per orders and foam dressing applied. Patient tolerated dressing change well. Will continue to monitor for remainder of shift. 

## 2019-01-03 NOTE — Progress Notes (Signed)
Initial Nutrition Assessment  RD working remotely.  DOCUMENTATION CODES:   Not applicable  INTERVENTION:   -MVI with minerals daily -30 ml Prostat BID, each supplement provides 100 kcals and 15 grams protein -1 packet Juven BID, each packet provides 95 calories, 2.5 grams of protein (collagen), and 9.8 grams of carbohydrate (3 grams sugar); also contains 7 grams of L-arginine and L-glutamine, 300 mg vitamin C, 15 mg vitamin E, 1.2 mcg vitamin B-12, 9.5 mg zinc, 200 mg calcium, and 1.5 g  Calcium Beta-hydroxy-Beta-methylbutyrate to support wound healing  NUTRITION DIAGNOSIS:   Increased nutrient needs related to wound healing as evidenced by estimated needs.  GOAL:   Patient will meet greater than or equal to 90% of their needs  MONITOR:   PO intake, Supplement acceptance, Labs, Weight trends, Skin, I & O's  REASON FOR ASSESSMENT:   Low Braden    ASSESSMENT:   [name] [name] is a 53 year old man with PMH of quadriplegia, gastroparesis, chronic anemia, chronic sacral wound, recent ex-lap to remove necrotic bowel and diverting colostomy who presented for a new wound in the RLQ of the abdomen.  He reports he was being bathed, and the area was pushed on and pus came draining out. He has no feeling in that site so it does not hurt or feel bad.  He has another chronic wound near that site, on the left hip and on the sacrum.  He was afebrile.  He has chronically low blood pressure given his quadriplegia. In the ED he was noted to have a WBC of 11, Hgb of 6.9.  CT of the abdomen showed a fluid collection at site of drainage (right abdomen/flank) concerning for abscess.  He was started on clindamycin at that time. General surgery saw the patient and packed the wound and sent wound cultures.  Pt admitted with cellulitis of abdominal wall.  8/17- s/p bedside debridement of wounds with general surgery 8/18- s/p bedside debridement of wounds with general surgery  Reviewed I/O's: -1.1 L x 24  hours and -4.4 L since admission  UOP: 2.2 L since admission  Per CWOCN note on 12/31/18, pt with stage IV pressure injuries on rt thoracic area, sacrum, bilateral ischial tuberosity, perineal area, and scrotum; stage III pressure injury to lt hip; and rt flank abscess with surrounding cellulitis.   Wt has been stable over the past year.   Pt with good appetite; noted meal completion 50-100%. Pt familiar to this RD due to multiple prior admissions. He uses Juven PTA to help facilitate wound healing. General surgery recommending possible referral to outpatient wound center.   IR following for antibiotics. Pt is active with [facility] for home health services.   Labs reviewed.   Diet Order:   Diet Order            Diet regular Room service appropriate? Yes; Fluid consistency: Thin  Diet effective now              EDUCATION NEEDS:   No education needs have been identified at this time  Skin:  Skin Assessment: Skin Integrity Issues: Skin Integrity Issues:: Stage IV, Stage III, Other (Comment) Stage III: lt hip Stage IV: rt thoracic area, sacrum, bilateral ischial tuberosity, perineal area, and scrotum Other: rt flank abscess with surrounding cellulits  Last BM:  01/02/19 (via colostomy)  Height:   Ht Readings from Last 1 Encounters:  12/30/18 6' 0.01" (1.829 m)    Weight:   Wt Readings from Last 1  Encounters:  12/30/18 89.5 kg    Ideal Body Weight:  70.8 kg(adjusted for quadriplegia)  BMI:  Body mass index is 26.75 kg/m.  Estimated Nutritional Needs:   Kcal:  1900-2100  Protein:  105-120 grams  Fluid:  > 1.9 L    [name] [name] A. Jimmye Norman, RD, LDN, [place] Registered Dietitian II Certified Diabetes Care and Education Specialist Pager: [phone] After hours Pager: [phone]

## 2019-01-03 NOTE — Progress Notes (Signed)
    CC: Sacral decubitus/abdominal wound  Subjective: Stable abdominal wounds look the same.    Objective: Vital signs in last 24 hours: Temp:  [98.4 F (36.9 C)-98.8 F (37.1 C)] 98.4 F (36.9 C) (08/20 0850) Pulse Rate:  [98-113] 113 (08/20 0850) Resp:  [17-18] 18 (08/20 0850) BP: (95-108)/(55-79) 97/59 (08/20 0850) SpO2:  [99 %-100 %] 100 % (08/20 0850) Last BM Date: 01/01/19  Intake/Output from previous day: 08/19 0701 - 08/20 0700 In: 1150 [P.O.:950; IV Piggyback:200] Out: 2200 [Urine:2200] Intake/Output this shift: No intake/output data recorded.  General appearance: alert, cooperative and no distress Skin: Abdominal wounds and sacral unchanged.  Lab Results:  Recent Labs    01/02/19 0320  WBC 10.9*  HGB 8.2*  HCT 26.3*  PLT 409*    BMET Recent Labs    01/01/19 0405 01/03/19 0537  NA 136 137  K 4.4 3.5  CL 107 106  CO2 23 24  GLUCOSE 90 76  BUN 8 5*  CREATININE <0.30* <0.30*  CALCIUM 7.7* 7.9*   PT/INR No results for input(s): LABPROT, INR in the last 72 hours.  No results for input(s): AST, ALT, ALKPHOS, BILITOT, PROT, ALBUMIN in the last 168 hours.   Lipase     Component Value Date/Time   LIPASE 36 10/14/2018 0150     Medications: . sodium chloride   Intravenous Once  . acidophilus  2 capsule Oral Daily  . enoxaparin (LOVENOX) injection  40 mg Subcutaneous Q24H  . fesoterodine  8 mg Oral q morning - 10a  . guaiFENesin  1,200 mg Oral BID  . pantoprazole  40 mg Oral BID  . sucralfate  1 g Oral TID AC & HS    Assessment/Plan Hx OSA CKD Neurogenic bladder with chronic suprapubic catheter Quadriplegia Chronic sacral decubitus with osteomyelitis  Right superficial lower abdominal abscess with spontaneous drainage - Wounds were further debrided this morning (see pictures above) and the two openings connect. Continue daily packing with saline dampened kerlex.  ID -currently rocephin 8/16>>, vancomycin 8/16>>. WBC normalized  10.2, TMAX 99.2 FEN -reg diet VTE -SCD, per primary/ok for chemical DVT prophylaxis from surgical standpoint Foley -SP catheter  Plan:  Continue local wound care.  Nothing further to add from a surgical standpoint.  Please call if we can be of further assistance.        LOS: 4 days    [name] [name] 01/03/2019 [phone]

## 2019-01-03 NOTE — Progress Notes (Signed)
Right thigh dressing removed, cleansed with sterile saline, applied wet to dry dressing, and foam dressing over. Patient tolerated well. Positioned for comfort with call bell in reach. Will continue to monitor for remainder of shift. 

## 2019-01-03 NOTE — Progress Notes (Signed)
Patient ID: [name] [name], male   DOB: 1966/02/16, 53 y.o.   MRN: [id number]         [facility]  Date of Admission:  12/29/2018           Day 6 vancomycin        Day 3 meropenem ASSESSMENT: I am not sure what is causing his shortness of breath.  His right flank abscess is looking much better.  PLAN: 1. 1 more day of vancomycin and 4 more days of meropenem  Principal Problem:   Abdominal wall abscess Active Problems:   Sacral decubitus ulcer, stage IV ([name]   S/P colostomy ([name]   Normocytic anemia   Hypokalemia   Gastroparesis   Quadriplegia, C5-C7, complete ([name]   Hypomagnesemia   Scheduled Meds: . sodium chloride   Intravenous Once  . acidophilus  2 capsule Oral Daily  . enoxaparin (LOVENOX) injection  40 mg Subcutaneous Q24H  . fesoterodine  8 mg Oral q morning - 10a  . guaiFENesin  1,200 mg Oral BID  . pantoprazole  40 mg Oral BID  . sucralfate  1 g Oral TID AC & HS   Continuous Infusions: . meropenem (MERREM) IV 1 g (01/03/19 0619)  . vancomycin 1,000 mg (01/03/19 0901)   PRN Meds:.ondansetron, sodium chloride flush   SUBJECTIVE: He says that he is not feeling well today.  He says that he cannot "catch his breath".  He has had some very mild nonproductive cough overnight.  Review of Systems: Review of Systems  Constitutional: Negative for fever.  Respiratory: Positive for cough and shortness of breath. Negative for sputum production and wheezing.   Cardiovascular: Negative for chest pain.    Allergies  Allergen Reactions  . Ferumoxytol Anaphylaxis and Other (See Comments)     SYNCOPE; patient tolerated venofer 10/01/18 s rxn   . Oxybutynin Other (See Comments)    Hallucinations    . Vancomycin Other (See Comments)    ARF 05-2016 -- affects kidneys     OBJECTIVE: Vitals:   01/02/19 1330 01/02/19 2122 01/03/19 0407 01/03/19 0850  BP:  103/71 108/79 (!) 97/59  Pulse: 98 (!) 103 (!) 106 (!) 113  Resp:  18 17 18    Temp:  98.6 F (37 C) 98.8 F (37.1 C) 98.4 F (36.9 C)  TempSrc:  Oral Oral Oral  SpO2:  100% 99% 100%  Weight:      Height:       Body mass index is 26.75 kg/m.  Physical Exam Constitutional:      Comments: .  He appears somewhat worried.  He is sitting up in bed.  Cardiovascular:     Rate and Rhythm: Normal rate and regular rhythm.     Heart sounds: No murmur.  Pulmonary:     Effort: Pulmonary effort is normal.     Breath sounds: Normal breath sounds. No wheezing or rales.  Abdominal:     Comments: Right flank wound looks much cleaner.     Lab Results Lab Results  Component Value Date   WBC 10.9 (H) 01/02/2019   HGB 8.2 (L) 01/02/2019   HCT 26.3 (L) 01/02/2019   MCV 84.3 01/02/2019   PLT 409 (H) 01/02/2019    Lab Results  Component Value Date   CREATININE <0.30 (L) 01/03/2019   BUN 5 (L) 01/03/2019   NA 137 01/03/2019   K 3.5 01/03/2019   CL 106 01/03/2019   CO2 24 01/03/2019    Lab  Results  Component Value Date   ALT 16 10/16/2018   AST 15 10/16/2018   ALKPHOS 50 10/16/2018   BILITOT 0.8 10/16/2018     Microbiology: Recent Results (from the past 240 hour(s))  Culture, blood (Routine X 2) w Reflex to ID Panel     Status: None   Collection Time: 12/29/18  3:37 PM   Specimen: BLOOD  Result Value Ref Range Status   Specimen Description BLOOD RIGHT PORTA CATH  Final   Special Requests   Final    BOTTLES DRAWN AEROBIC AND ANAEROBIC Blood Culture adequate volume   Culture   Final    NO GROWTH 5 DAYS Performed at [facility], [address], [place], [place] [address]    Report Status 01/03/2019 FINAL  Final  SARS CORONAVIRUS 2 Nasal Swab Aptima Multi Swab     Status: None   Collection Time: 12/29/18  7:01 PM   Specimen: Aptima Multi Swab; Nasal Swab  Result Value Ref Range Status   SARS Coronavirus 2 NEGATIVE NEGATIVE Final    Comment: (NOTE) SARS-CoV-2 target nucleic acids are NOT DETECTED. The SARS-CoV-2 RNA is generally detectable in  upper and lower respiratory specimens during the acute phase of infection. Negative results do not preclude SARS-CoV-2 infection, do not rule out co-infections with other pathogens, and should not be used as the sole basis for treatment or other patient management decisions. Negative results must be combined with clinical observations, patient history, and epidemiological information. The expected result is Negative. Fact Sheet for Patients: SugarRoll.be Fact Sheet for Healthcare Providers: https://www.woods-mathews.com/ This test is not yet approved or cleared by the [place] FDA and  has been authorized for detection and/or diagnosis of SARS-CoV-2 by FDA under an Emergency Use Authorization (EUA). This EUA will remain  in effect (meaning this test can be used) for the duration of the COVID-19 declaration under Section 56 4(b)(1) of the Act, 21 U.S.C. section 360bbb-3(b)(1), unless the authorization is terminated or revoked sooner. Performed at [facility], [place] [address], [place], [place] [address]   Aerobic Culture (superficial specimen)     Status: None   Collection Time: 12/30/18  2:13 AM   Specimen: Wound  Result Value Ref Range Status   Specimen Description WOUND ABDOMEN  Final   Special Requests NONE  Final   Gram Stain   Final    RARE WBC PRESENT, PREDOMINANTLY PMN FEW GRAM POSITIVE COCCI IN CLUSTERS RARE GRAM NEGATIVE RODS Performed at [facility], [address] [place], [place] [address]    Culture   Final    MODERATE ESCHERICHIA COLI Confirmed Extended Spectrum Beta-Lactamase Producer (ESBL).  In bloodstream infections from ESBL organisms, carbapenems are preferred over piperacillin/tazobactam. They are shown to have a lower risk of mortality.    Report Status 01/01/2019 FINAL  Final   Organism ID, Bacteria ESCHERICHIA COLI  Final      Susceptibility   Escherichia coli - MIC*    AMPICILLIN >=32  RESISTANT Resistant     CEFAZOLIN >=64 RESISTANT Resistant     CEFEPIME RESISTANT Resistant     CEFTAZIDIME RESISTANT Resistant     CEFTRIAXONE >=64 RESISTANT Resistant     CIPROFLOXACIN >=4 RESISTANT Resistant     GENTAMICIN >=16 RESISTANT Resistant     IMIPENEM <=0.25 SENSITIVE Sensitive     TRIMETH/SULFA <=20 SENSITIVE Sensitive     AMPICILLIN/SULBACTAM >=32 RESISTANT Resistant     PIP/TAZO <=4 SENSITIVE Sensitive  Extended ESBL POSITIVE Resistant     * MODERATE ESCHERICHIA COLI  Culture, blood (Routine X 2) w Reflex to ID Panel     Status: None (Preliminary result)   Collection Time: 12/30/18 10:38 AM   Specimen: BLOOD RIGHT HAND  Result Value Ref Range Status   Specimen Description BLOOD RIGHT HAND  Final   Special Requests   Final    BOTTLES DRAWN AEROBIC ONLY Blood Culture results may not be optimal due to an inadequate volume of blood received in culture bottles   Culture   Final    NO GROWTH 4 DAYS Performed at [facility], [place] [address], [place], [place] [address]    Report Status PENDING  Incomplete  MRSA PCR Screening     Status: Abnormal   Collection Time: 01/01/19  1:07 PM   Specimen: Nasopharyngeal  Result Value Ref Range Status   MRSA by PCR POSITIVE (A) NEGATIVE Final    Comment:        The GeneXpert MRSA Assay (FDA approved for NASAL specimens only), is one component of a comprehensive MRSA colonization surveillance program. It is not intended to diagnose MRSA infection nor to guide or monitor treatment for MRSA infections. RESULT CALLED TO, READ BACK BY AND VERIFIED WITH: Buelah Manis RN [id number] 01/01/19 A BROWNING Performed at [facility], [place] [address], [place], [place] [address]     Michel Bickers, [facility] [phone] pager   [phone] cell 01/03/2019, 10:29 AM
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
## 2019-01-03 NOTE — Progress Notes (Signed)
Called to patient room. Patient complained of shortness of breath, chest pressure, has congestive cough, lung sounds are coarse to right. Patient vitals 97/59, HR 113, 100% 2L n.c., RR 18, MEWS yellow. paged and notified Dr. Tarri Abernethy. MD will be in to see patient. Will continue to monitor closely.

## 2019-01-04 ENCOUNTER — Inpatient Hospital Stay (HOSPITAL_COMMUNITY): Payer: Medicare Other

## 2019-01-04 DIAGNOSIS — R0602 Shortness of breath: Secondary | ICD-10-CM

## 2019-01-04 DIAGNOSIS — B9562 Methicillin resistant Staphylococcus aureus infection as the cause of diseases classified elsewhere: Secondary | ICD-10-CM

## 2019-01-04 LAB — ECHOCARDIOGRAM COMPLETE
Height: 72.008 in
Weight: 3156.99 oz

## 2019-01-04 LAB — CULTURE, BLOOD (ROUTINE X 2): Culture: NO GROWTH

## 2019-01-04 MED ORDER — HEPARIN SOD (PORK) LOCK FLUSH 100 UNIT/ML IV SOLN
500.0000 [IU] | INTRAVENOUS | Status: AC | PRN
  Administered 2019-01-04: 500 [IU]

## 2019-01-04 MED ORDER — SODIUM CHLORIDE 0.9 % IV SOLN
1.0000 g | INTRAVENOUS | Status: DC
  Administered 2019-01-04: 1000 mg via INTRAVENOUS
  Filled 2019-01-04 (×2): qty 1

## 2019-01-04 MED ORDER — ERTAPENEM IV (FOR PTA / DISCHARGE USE ONLY): 1.0000 g | INTRAVENOUS | 0 refills | Status: AC

## 2019-01-04 MED ORDER — SODIUM CHLORIDE 0.9 % IV SOLN: 1.0000 g | Freq: Three times a day (TID) | INTRAVENOUS | 0 refills | Status: DC
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
## 2019-01-04 NOTE — Progress Notes (Signed)
0106 Pt had yellow MEWS score for SBP 100, Pulse 102. Pt sitting in bed listening to music on his phone. Alert and orientedx4, denies pain. No respiratory distress. Pt has history of chronic hypotension. No acute change. Will recheck vital signs in 2 hours per MEWS guidelines.
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
## 2019-01-04 NOTE — Progress Notes (Addendum)
Pharmacy Antibiotic Note  [name] [name] is a 53 y.o. male admitted on 12/29/2018 with a large abdominal wall abscess. A culture of the abscess is showing ESBL E coli.  Pharmacy has been consulted for meropenem dosing.  WBC is 10.2 and patient afebrile. Noted that patient is quadriplegic so creatinine likely falsely low.    Plan: Meropenem 1 gm every 8 hours Monitor for clinical improvement   Height: 6' 0.01" (182.9 cm) Weight: 197 lb 5 oz (89.5 kg) IBW/kg (Calculated) : 77.62  Temp (24hrs), Avg:98.3 F (36.8 C), Min:98.1 F (36.7 C), Max:98.6 F (37 C)  Recent Labs  Lab 12/29/18 1537 12/30/18 0502 12/31/18 0407 01/01/19 0405 01/02/19 0320 01/03/19 0537  WBC 11.0* 11.2* 10.2  --  10.9*  --   CREATININE 0.49* 0.40* <0.30* <0.30*  --  <0.30*  LATICACIDVEN 1.7  --   --   --   --   --     CrCl cannot be calculated (This lab value cannot be used to calculate CrCl because it is not a number: <0.30).    Allergies  Allergen Reactions  . Ferumoxytol Anaphylaxis and Other (See Comments)     SYNCOPE; patient tolerated venofer 10/01/18 s rxn   . Oxybutynin Other (See Comments)    Hallucinations    . Vancomycin Other (See Comments)    ARF 05-2016 -- affects kidneys     Antimicrobials this admission: Vanc 8/16>>8/21 Ceftriaxone 8/16>> 8/18 Meropenem 8/18>> (anticipated stop date 8/24)  Thank you for allowing pharmacy to be a part of this patient's care. Emeline General, PharmD Candidate  I discussed / reviewed the pharmacy note by Ms. Toula Moos and I agree with the  findings and plans as documented. Anette Guarneri, PharmD 01/04/2019 10:01 AM

## 2019-01-04 NOTE — TOC Progression Note (Signed)
Transition of Care [facility]) - Progression Note    Patient Details  Name: [name] [name] MRN: [id number] Date of Birth: 06-23-1965  Transition of Care [facility]) CM/SW [place], [place] Phone Number: 01/04/2019, 2:30 PM  Clinical Narrative:    Pt abx require prior authorization in order for them to be completed at home.  [name], infusion coordinator following. Pt also aware and he will let his sister know what is going on. Pt familiar with abx at home; he is aware that if his authorization is not received before the weekend he likely will remain inpatient as the last dose is due on Monday. Dr. Tarri Abernethy given [name]'s number to directions on how to complete authorization.    Expected Discharge Plan: [place] Barriers to Discharge: Continued Medical Work up  Expected Discharge Plan and Services Expected Discharge Plan: [place] In-house Referral: Clinical Social Work Discharge Planning Services: CM Consult Post Acute Care Choice: Resumption of Svcs/PTA Provider Living arrangements for the past 2 months: Single Family Home Expected Discharge Date: 01/04/19                   Date DME Agency Contacted: 01/01/19 Time DME Agency Contacted: 1216 Representative spoke with at DME Agency: [place] ([place]) Interventions    Readmission Risk Interventions Readmission Risk Prevention Plan 10/15/2018  Transportation Screening Complete  PCP or Specialist Appt within 3-5 Days Not Complete  Not Complete comments not yet ready for d/c  HRI or [place] Complete  Social Work Consult for [place] Planning/Counseling Complete  Palliative Care Screening Not Applicable  Medication Review Press photographer) Complete  Some recent data might be hidden

## 2019-01-04 NOTE — Progress Notes (Signed)
PTAR arrived. PT no complains at this time. Pt ready for discharge.

## 2019-01-04 NOTE — Progress Notes (Signed)
Patient ID: [name] [name], male   DOB: 09/03/1965, 53 y.o.   MRN: [id number]         [facility]  Date of Admission:  12/29/2018           Day 7 vancomycin        Day 4 meropenem ASSESSMENT: The polymicrobial right flank wound continues to improve following serial debridements therapy.  PLAN: 1. Discharge home on IV ertapenem via Port-A-Cath 1 g daily for 3 more days  Diagnosis: Right flank wound  Culture Result: ESBL E. coli  Allergies  Allergen Reactions  . Ferumoxytol Anaphylaxis and Other (See Comments)     SYNCOPE; patient tolerated venofer 10/01/18 s rxn   . Oxybutynin Other (See Comments)    Hallucinations    . Vancomycin Other (See Comments)    ARF 05-2016 -- affects kidneys     OPAT Orders Discharge antibiotics: Per pharmacy protocol ertapenem  Duration: 3 more days End Date: 01/07/2019  [facility] Care Per Protocol:  Labs weekly while on IV antibiotics: None needed __ CBC with differential __ BMP __ CMP __ CRP __ ESR __ Vancomycin trough __ CK  _NA_ Please pull PIC at completion of IV antibiotics __ Please leave PIC in place until doctor has seen patient or been notified  Fax weekly labs to [phone]  Clinic Follow Up Appt: As needed  Principal Problem:   Abdominal wall abscess Active Problems:   Sacral decubitus ulcer, stage IV ([name]   S/P colostomy ([name]   Normocytic anemia   Anemia of chronic disease   Hypokalemia   Gastroparesis   Quadriplegia, C5-C7, complete ([name]   Hypomagnesemia   Dyspnea   Scheduled Meds: . sodium chloride   Intravenous Once  . acidophilus  2 capsule Oral Daily  . Chlorhexidine Gluconate Cloth  6 each Topical Q0600  . enoxaparin (LOVENOX) injection  40 mg Subcutaneous Q24H  . feeding supplement (PRO-STAT SUGAR FREE 64)  30 mL Oral BID WC  . fesoterodine  8 mg Oral q morning - 10a  . guaiFENesin  1,200 mg Oral BID  . multivitamin with minerals  1 tablet Oral Daily  . mupirocin  ointment  1 application Nasal BID  . nutrition supplement (JUVEN)  1 packet Oral BID BM  . pantoprazole  40 mg Oral BID  . sucralfate  1 g Oral TID AC & HS   Continuous Infusions: . meropenem (MERREM) IV 1 g (01/04/19 0528)   PRN Meds:.albuterol, morphine injection, ondansetron, sodium chloride flush   SUBJECTIVE: [name] [name] is feeling much better today.  His shortness of breath has resolved.  Review of Systems: Review of Systems  Constitutional: Negative for fever.  Respiratory: Negative for cough, sputum production, shortness of breath and wheezing.   Cardiovascular: Negative for chest pain.    Allergies  Allergen Reactions  . Ferumoxytol Anaphylaxis and Other (See Comments)     SYNCOPE; patient tolerated venofer 10/01/18 s rxn   . Oxybutynin Other (See Comments)    Hallucinations    . Vancomycin Other (See Comments)    ARF 05-2016 -- affects kidneys     OBJECTIVE: Vitals:   01/04/19 0106 01/04/19 0316 01/04/19 0500 01/04/19 0907  BP: 100/72 113/75 119/82 102/74  Pulse: (!) 102 93 (!) 102 (!) 109  Resp: _0 Temp: 98.4 F (36.9 C) 98.4 F (36.9 C) 98.2 F (36.8 C) 98.1 F (36.7 C)  TempSrc: Oral Oral Oral Oral  SpO2: 100% 100% 100%  100%  Weight:      Height:       Body mass index is 26.75 kg/m.  Physical Exam Constitutional:      Comments: He is sitting up in bed.  He is playing a card game on his cell phone. He is in good spirits.  Cardiovascular:     Rate and Rhythm: Normal rate and regular rhythm.     Heart sounds: No murmur.  Pulmonary:     Effort: Pulmonary effort is normal.     Breath sounds: Normal breath sounds. No wheezing or rales.  Abdominal:     Comments: Right flank wound looks much cleaner.     Lab Results Lab Results  Component Value Date   WBC 10.9 (H) 01/02/2019   HGB 8.2 (L) 01/02/2019   HCT 26.3 (L) 01/02/2019   MCV 84.3 01/02/2019   PLT 409 (H) 01/02/2019    Lab Results  Component Value Date   CREATININE <0.30 (L)  01/03/2019   BUN 5 (L) 01/03/2019   NA 137 01/03/2019   K 3.5 01/03/2019   CL 106 01/03/2019   CO2 24 01/03/2019    Lab Results  Component Value Date   ALT 16 10/16/2018   AST 15 10/16/2018   ALKPHOS 50 10/16/2018   BILITOT 0.8 10/16/2018     Microbiology: Recent Results (from the past 240 hour(s))  Culture, blood (Routine X 2) w Reflex to ID Panel     Status: None   Collection Time: 12/29/18  3:37 PM   Specimen: BLOOD  Result Value Ref Range Status   Specimen Description BLOOD RIGHT PORTA CATH  Final   Special Requests   Final    BOTTLES DRAWN AEROBIC AND ANAEROBIC Blood Culture adequate volume   Culture   Final    NO GROWTH 5 DAYS Performed at [facility], [address] [place], [place] [address]    Report Status 01/03/2019 FINAL  Final  SARS CORONAVIRUS 2 Nasal Swab Aptima Multi Swab     Status: None   Collection Time: 12/29/18  7:01 PM   Specimen: Aptima Multi Swab; Nasal Swab  Result Value Ref Range Status   SARS Coronavirus 2 NEGATIVE NEGATIVE Final    Comment: (NOTE) SARS-CoV-2 target nucleic acids are NOT DETECTED. The SARS-CoV-2 RNA is generally detectable in upper and lower respiratory specimens during the acute phase of infection. Negative results do not preclude SARS-CoV-2 infection, do not rule out co-infections with other pathogens, and should not be used as the sole basis for treatment or other patient management decisions. Negative results must be combined with clinical observations, patient history, and epidemiological information. The expected result is Negative. Fact Sheet for Patients: SugarRoll.be Fact Sheet for Healthcare Providers: https://www.woods-mathews.com/ This test is not yet approved or cleared by the [place] FDA and  has been authorized for detection and/or diagnosis of SARS-CoV-2 by FDA under an Emergency Use Authorization (EUA). This EUA will remain  in effect (meaning this test  can be used) for the duration of the COVID-19 declaration under Section 56 4(b)(1) of the Act, 21 U.S.C. section 360bbb-3(b)(1), unless the authorization is terminated or revoked sooner. Performed at [facility], [place] [address], [place], [place] [address]   Aerobic Culture (superficial specimen)     Status: None   Collection Time: 12/30/18  2:13 AM   Specimen: Wound  Result Value Ref Range Status   Specimen Description WOUND ABDOMEN  Final   Special Requests NONE  Final  Gram Stain   Final    RARE WBC PRESENT, PREDOMINANTLY PMN FEW GRAM POSITIVE COCCI IN CLUSTERS RARE GRAM NEGATIVE RODS Performed at [facility], [place] [address] [place], [place] [address]    Culture   Final    MODERATE ESCHERICHIA COLI Confirmed Extended Spectrum Beta-Lactamase Producer (ESBL).  In bloodstream infections from ESBL organisms, carbapenems are preferred over piperacillin/tazobactam. They are shown to have a lower risk of mortality.    Report Status 01/01/2019 FINAL  Final   Organism ID, Bacteria ESCHERICHIA COLI  Final      Susceptibility   Escherichia coli - MIC*    AMPICILLIN >=32 RESISTANT Resistant     CEFAZOLIN >=64 RESISTANT Resistant     CEFEPIME RESISTANT Resistant     CEFTAZIDIME RESISTANT Resistant     CEFTRIAXONE >=64 RESISTANT Resistant     CIPROFLOXACIN >=4 RESISTANT Resistant     GENTAMICIN >=16 RESISTANT Resistant     IMIPENEM <=0.25 SENSITIVE Sensitive     TRIMETH/SULFA <=20 SENSITIVE Sensitive     AMPICILLIN/SULBACTAM >=32 RESISTANT Resistant     PIP/TAZO <=4 SENSITIVE Sensitive     Extended ESBL POSITIVE Resistant     * MODERATE ESCHERICHIA COLI  Culture, blood (Routine X 2) w Reflex to ID Panel     Status: None   Collection Time: 12/30/18 10:38 AM   Specimen: BLOOD RIGHT HAND  Result Value Ref Range Status   Specimen Description BLOOD RIGHT HAND  Final   Special Requests   Final    BOTTLES DRAWN AEROBIC ONLY Blood Culture results may not be optimal due to  an inadequate volume of blood received in culture bottles   Culture   Final    NO GROWTH 5 DAYS Performed at [facility], [address], [place], [place] [address]    Report Status 01/04/2019 FINAL  Final  MRSA PCR Screening     Status: Abnormal   Collection Time: 01/01/19  1:07 PM   Specimen: Nasopharyngeal  Result Value Ref Range Status   MRSA by PCR POSITIVE (A) NEGATIVE Final    Comment:        The GeneXpert MRSA Assay (FDA approved for NASAL specimens only), is one component of a comprehensive MRSA colonization surveillance program. It is not intended to diagnose MRSA infection nor to guide or monitor treatment for MRSA infections. RESULT CALLED TO, READ BACK BY AND VERIFIED WITH: Buelah Manis RN [id number] 01/01/19 A BROWNING Performed at [facility], [place] [address], [place], [place] [address]     Michel Bickers, [place] [facility] [place] [facility] [phone] pager   [phone] cell 01/04/2019, 12:05 PM

## 2019-01-04 NOTE — TOC Transition Note (Signed)
Transition of Care [facility]) - CM/SW Discharge Note   Patient Details  Name: CAMILLE SHILLITO MRN: [id number] Date of Birth: 1965-05-29  [facility]) CM/SW Contact:  [place], [place] Phone Number: 01/04/2019, 4:28 PM   Clinical Narrative:    IV abx have been approved per [name] infusion coordinator with [facility]; pt HH arranged through [facility] at [place]. Orders and face to face entered; pt sister aware and able to do home abx per pt.   PTAR papers sent to RN for transport home when ready. PTAR #- [id number]; After Hours PTAR#- [id number] option #1 then option #3.    Final next level of care: [place] Barriers to Discharge: Barriers Resolved   Patient Goals and CMS Choice Patient states their goals for this hospitalization and ongoing recovery are:: to return home CMS Medicare.gov Compare Post Acute Care list provided to:: Patient Choice offered to / list presented to : Patient  Discharge Placement                  Name of family member notified: pt will inform his sister Patient and family notified of of transfer: 01/04/19  Discharge Plan and Services In-house Referral: Clinical Social Work Discharge Planning Services: CM Consult Post Acute Care Choice: Resumption of Svcs/PTA Provider              Date DME Agency Contacted: 01/01/19 Time DME Agency Contacted: 1216 Representative spoke with at DME Agency: [place] Arranged: RN, Nurse's Aide, IV Antibiotics [place] Agency: Tour manager, [place] ([name]) Date [place]: 01/04/19 Time [place]: 1627 Representative spoke with at [place]: Blair Heys; [place] Determinants of Health (SDOH) Interventions     Readmission Risk Interventions Readmission Risk Prevention Plan 01/04/2019 10/15/2018  Transportation Screening Complete Complete  PCP or Specialist Appt within 5-7 Days Complete -  PCP or Specialist Appt within 3-5  Days - Not Complete  Not Complete comments - not yet ready for d/c  Home Care Screening Complete -  Medication Review (RN CM) Complete -  HRI or [place] - Complete  Social Work Consult for [place] Planning/Counseling - Complete  Palliative Care Screening - Not Applicable  Medication Review Press photographer) - Complete  Some recent data might be hidden

## 2019-01-04 NOTE — Progress Notes (Signed)
  Echocardiogram 2D Echocardiogram has been performed.  Darlina Sicilian M 01/04/2019, 10:36 AM

## 2019-01-04 NOTE — Progress Notes (Signed)
PHARMACY CONSULT NOTE FOR:  OUTPATIENT  PARENTERAL ANTIBIOTIC THERAPY (OPAT)  Indication: polymicrobial flank wound  Regimen: Ertapenem 1 gm every 24 hours End date: 01/07/2019  IV antibiotic discharge orders are pended. To discharging provider:  please sign these orders via discharge navigator,  Select New Orders & click on the button choice - Manage This Unsigned Work.     Thank you for allowing pharmacy to be a part of this patient's care.  Jimmy Footman, PharmD, BCPS, BCIDP Infectious Diseases Clinical Pharmacist Phone: [phone] 01/04/2019, 1:59 PM
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
## 2019-01-04 NOTE — Consult Note (Signed)
[facility] [place] Nurse wound follow up Surgery has debrided and provided orders for topical wound care.  [facility] team will sign off.  Bedside RN to perform wound care.  Patient will follow up with wound care center.  Will not follow at this time.  Please re-consult if needed.  Domenic Moras MSN, RN, FNP-BC [facility]ON Wound, Ostomy, Continence Nurse Pager [phone]

## 2019-01-05 DIAGNOSIS — B9629 Other Escherichia coli [E. coli] as the cause of diseases classified elsewhere: Secondary | ICD-10-CM | POA: Diagnosis not present

## 2019-01-05 DIAGNOSIS — L03115 Cellulitis of right lower limb: Secondary | ICD-10-CM | POA: Diagnosis not present

## 2019-01-05 DIAGNOSIS — L03311 Cellulitis of abdominal wall: Secondary | ICD-10-CM | POA: Diagnosis not present

## 2019-01-05 DIAGNOSIS — G8253 Quadriplegia, C5-C7 complete: Secondary | ICD-10-CM | POA: Diagnosis not present

## 2019-01-14 ENCOUNTER — Encounter (HOSPITAL_BASED_OUTPATIENT_CLINIC_OR_DEPARTMENT_OTHER): Payer: Medicare Other | Attending: Internal Medicine

## 2019-01-14 DIAGNOSIS — L89214 Pressure ulcer of right hip, stage 4: Secondary | ICD-10-CM | POA: Insufficient documentation

## 2019-01-14 DIAGNOSIS — L89894 Pressure ulcer of other site, stage 4: Secondary | ICD-10-CM | POA: Insufficient documentation

## 2019-01-14 DIAGNOSIS — L89892 Pressure ulcer of other site, stage 2: Secondary | ICD-10-CM | POA: Diagnosis not present

## 2019-01-14 DIAGNOSIS — L89323 Pressure ulcer of left buttock, stage 3: Secondary | ICD-10-CM | POA: Insufficient documentation

## 2019-01-14 DIAGNOSIS — L89154 Pressure ulcer of sacral region, stage 4: Secondary | ICD-10-CM | POA: Diagnosis not present

## 2019-01-14 DIAGNOSIS — L89890 Pressure ulcer of other site, unstageable: Secondary | ICD-10-CM | POA: Diagnosis not present

## 2019-01-14 DIAGNOSIS — I10 Essential (primary) hypertension: Secondary | ICD-10-CM | POA: Diagnosis not present

## 2019-01-14 DIAGNOSIS — L89224 Pressure ulcer of left hip, stage 4: Secondary | ICD-10-CM | POA: Diagnosis not present

## 2019-01-14 DIAGNOSIS — G4730 Sleep apnea, unspecified: Secondary | ICD-10-CM | POA: Insufficient documentation

## 2019-01-14 DIAGNOSIS — G8250 Quadriplegia, unspecified: Secondary | ICD-10-CM | POA: Diagnosis not present

## 2019-01-15 ENCOUNTER — Other Ambulatory Visit: Payer: Self-pay

## 2019-01-15 DIAGNOSIS — K56609 Unspecified intestinal obstruction, unspecified as to partial versus complete obstruction: Secondary | ICD-10-CM

## 2019-01-15 HISTORY — DX: Unspecified intestinal obstruction, unspecified as to partial versus complete obstruction: K56.609

## 2019-01-22 ENCOUNTER — Other Ambulatory Visit: Payer: Self-pay

## 2019-01-22 ENCOUNTER — Emergency Department (HOSPITAL_COMMUNITY): Payer: Medicare Other

## 2019-01-22 ENCOUNTER — Inpatient Hospital Stay (HOSPITAL_COMMUNITY): Payer: Medicare Other

## 2019-01-22 ENCOUNTER — Encounter (HOSPITAL_COMMUNITY): Payer: Self-pay | Admitting: Emergency Medicine

## 2019-01-22 ENCOUNTER — Inpatient Hospital Stay (HOSPITAL_COMMUNITY)
Admission: EM | Admit: 2019-01-22 | Discharge: 2019-02-01 | DRG: 388 | Disposition: A | Discharge status: Home Health | Payer: Medicare Other | Attending: Internal Medicine | Admitting: Internal Medicine

## 2019-01-22 DIAGNOSIS — Z8614 Personal history of Methicillin resistant Staphylococcus aureus infection: Secondary | ICD-10-CM | POA: Diagnosis not present

## 2019-01-22 DIAGNOSIS — R1110 Vomiting, unspecified: Secondary | ICD-10-CM | POA: Diagnosis not present

## 2019-01-22 DIAGNOSIS — S31109D Unspecified open wound of abdominal wall, unspecified quadrant without penetration into peritoneal cavity, subsequent encounter: Secondary | ICD-10-CM

## 2019-01-22 DIAGNOSIS — S21201A Unspecified open wound of right back wall of thorax without penetration into thoracic cavity, initial encounter: Secondary | ICD-10-CM | POA: Diagnosis present

## 2019-01-22 DIAGNOSIS — Z8701 Personal history of pneumonia (recurrent): Secondary | ICD-10-CM

## 2019-01-22 DIAGNOSIS — M6284 Sarcopenia: Secondary | ICD-10-CM | POA: Diagnosis present

## 2019-01-22 DIAGNOSIS — R52 Pain, unspecified: Secondary | ICD-10-CM | POA: Diagnosis not present

## 2019-01-22 DIAGNOSIS — Z23 Encounter for immunization: Secondary | ICD-10-CM

## 2019-01-22 DIAGNOSIS — Z7401 Bed confinement status: Secondary | ICD-10-CM | POA: Diagnosis not present

## 2019-01-22 DIAGNOSIS — J9621 Acute and chronic respiratory failure with hypoxia: Secondary | ICD-10-CM | POA: Diagnosis present

## 2019-01-22 DIAGNOSIS — Z8619 Personal history of other infectious and parasitic diseases: Secondary | ICD-10-CM

## 2019-01-22 DIAGNOSIS — E43 Unspecified severe protein-calorie malnutrition: Secondary | ICD-10-CM | POA: Diagnosis not present

## 2019-01-22 DIAGNOSIS — R1084 Generalized abdominal pain: Secondary | ICD-10-CM

## 2019-01-22 DIAGNOSIS — G8250 Quadriplegia, unspecified: Secondary | ICD-10-CM | POA: Diagnosis present

## 2019-01-22 DIAGNOSIS — Z888 Allergy status to other drugs, medicaments and biological substances status: Secondary | ICD-10-CM | POA: Diagnosis not present

## 2019-01-22 DIAGNOSIS — Z933 Colostomy status: Secondary | ICD-10-CM

## 2019-01-22 DIAGNOSIS — L89154 Pressure ulcer of sacral region, stage 4: Secondary | ICD-10-CM | POA: Diagnosis present

## 2019-01-22 DIAGNOSIS — K3184 Gastroparesis: Secondary | ICD-10-CM | POA: Diagnosis present

## 2019-01-22 DIAGNOSIS — N319 Neuromuscular dysfunction of bladder, unspecified: Secondary | ICD-10-CM | POA: Diagnosis present

## 2019-01-22 DIAGNOSIS — R000 Tachycardia, unspecified: Secondary | ICD-10-CM | POA: Diagnosis present

## 2019-01-22 DIAGNOSIS — Z9359 Other cystostomy status: Secondary | ICD-10-CM

## 2019-01-22 DIAGNOSIS — E876 Hypokalemia: Secondary | ICD-10-CM | POA: Diagnosis present

## 2019-01-22 DIAGNOSIS — K56609 Unspecified intestinal obstruction, unspecified as to partial versus complete obstruction: Secondary | ICD-10-CM | POA: Diagnosis not present

## 2019-01-22 DIAGNOSIS — Z4682 Encounter for fitting and adjustment of non-vascular catheter: Secondary | ICD-10-CM | POA: Diagnosis not present

## 2019-01-22 DIAGNOSIS — Z1612 Extended spectrum beta lactamase (ESBL) resistance: Secondary | ICD-10-CM | POA: Diagnosis not present

## 2019-01-22 DIAGNOSIS — D638 Anemia in other chronic diseases classified elsewhere: Secondary | ICD-10-CM | POA: Diagnosis present

## 2019-01-22 DIAGNOSIS — Z79899 Other long term (current) drug therapy: Secondary | ICD-10-CM

## 2019-01-22 DIAGNOSIS — Z833 Family history of diabetes mellitus: Secondary | ICD-10-CM

## 2019-01-22 DIAGNOSIS — L89114 Pressure ulcer of right upper back, stage 4: Secondary | ICD-10-CM | POA: Diagnosis not present

## 2019-01-22 DIAGNOSIS — K5669 Other partial intestinal obstruction: Secondary | ICD-10-CM | POA: Diagnosis not present

## 2019-01-22 DIAGNOSIS — Z87440 Personal history of urinary (tract) infections: Secondary | ICD-10-CM

## 2019-01-22 DIAGNOSIS — K219 Gastro-esophageal reflux disease without esophagitis: Secondary | ICD-10-CM | POA: Diagnosis present

## 2019-01-22 DIAGNOSIS — M2550 Pain in unspecified joint: Secondary | ICD-10-CM | POA: Diagnosis not present

## 2019-01-22 DIAGNOSIS — Z881 Allergy status to other antibiotic agents status: Secondary | ICD-10-CM

## 2019-01-22 DIAGNOSIS — M869 Osteomyelitis, unspecified: Secondary | ICD-10-CM | POA: Diagnosis present

## 2019-01-22 DIAGNOSIS — Z6826 Body mass index (BMI) 26.0-26.9, adult: Secondary | ICD-10-CM | POA: Diagnosis not present

## 2019-01-22 DIAGNOSIS — K56699 Other intestinal obstruction unspecified as to partial versus complete obstruction: Secondary | ICD-10-CM | POA: Diagnosis not present

## 2019-01-22 DIAGNOSIS — B9620 Unspecified Escherichia coli [E. coli] as the cause of diseases classified elsewhere: Secondary | ICD-10-CM | POA: Diagnosis not present

## 2019-01-22 DIAGNOSIS — I959 Hypotension, unspecified: Secondary | ICD-10-CM | POA: Diagnosis not present

## 2019-01-22 DIAGNOSIS — L89610 Pressure ulcer of right heel, unstageable: Secondary | ICD-10-CM | POA: Diagnosis not present

## 2019-01-22 DIAGNOSIS — L02211 Cutaneous abscess of abdominal wall: Secondary | ICD-10-CM | POA: Diagnosis not present

## 2019-01-22 DIAGNOSIS — R140 Abdominal distension (gaseous): Secondary | ICD-10-CM | POA: Diagnosis not present

## 2019-01-22 DIAGNOSIS — R5381 Other malaise: Secondary | ICD-10-CM | POA: Diagnosis not present

## 2019-01-22 DIAGNOSIS — Z8719 Personal history of other diseases of the digestive system: Secondary | ICD-10-CM | POA: Diagnosis not present

## 2019-01-22 DIAGNOSIS — Z803 Family history of malignant neoplasm of breast: Secondary | ICD-10-CM

## 2019-01-22 DIAGNOSIS — R109 Unspecified abdominal pain: Secondary | ICD-10-CM | POA: Diagnosis present

## 2019-01-22 DIAGNOSIS — B9562 Methicillin resistant Staphylococcus aureus infection as the cause of diseases classified elsewhere: Secondary | ICD-10-CM | POA: Diagnosis not present

## 2019-01-22 DIAGNOSIS — R112 Nausea with vomiting, unspecified: Secondary | ICD-10-CM | POA: Diagnosis not present

## 2019-01-22 DIAGNOSIS — D649 Anemia, unspecified: Secondary | ICD-10-CM | POA: Diagnosis not present

## 2019-01-22 DIAGNOSIS — L89223 Pressure ulcer of left hip, stage 3: Secondary | ICD-10-CM | POA: Diagnosis present

## 2019-01-22 DIAGNOSIS — Z20828 Contact with and (suspected) exposure to other viral communicable diseases: Secondary | ICD-10-CM | POA: Diagnosis not present

## 2019-01-22 DIAGNOSIS — K56600 Partial intestinal obstruction, unspecified as to cause: Secondary | ICD-10-CM | POA: Diagnosis not present

## 2019-01-22 DIAGNOSIS — E662 Morbid (severe) obesity with alveolar hypoventilation: Secondary | ICD-10-CM | POA: Diagnosis not present

## 2019-01-22 DIAGNOSIS — Z8711 Personal history of peptic ulcer disease: Secondary | ICD-10-CM

## 2019-01-22 DIAGNOSIS — Z0189 Encounter for other specified special examinations: Secondary | ICD-10-CM

## 2019-01-22 DIAGNOSIS — L089 Local infection of the skin and subcutaneous tissue, unspecified: Secondary | ICD-10-CM | POA: Diagnosis not present

## 2019-01-22 DIAGNOSIS — B9689 Other specified bacterial agents as the cause of diseases classified elsewhere: Secondary | ICD-10-CM | POA: Diagnosis not present

## 2019-01-22 LAB — CBC WITH DIFFERENTIAL/PLATELET
Abs Immature Granulocytes: 0.05 10*3/uL (ref 0.00–0.07)
Basophils Absolute: 0 10*3/uL (ref 0.0–0.1)
Basophils Relative: 0 %
Eosinophils Absolute: 0 10*3/uL (ref 0.0–0.5)
Eosinophils Relative: 0 %
HCT: 26.8 % — ABNORMAL LOW (ref 39.0–52.0)
Hemoglobin: 8.1 g/dL — ABNORMAL LOW (ref 13.0–17.0)
Immature Granulocytes: 0 %
Lymphocytes Relative: 11 %
Lymphs Abs: 1.3 10*3/uL (ref 0.7–4.0)
MCH: 25.6 pg — ABNORMAL LOW (ref 26.0–34.0)
MCHC: 30.2 g/dL (ref 30.0–36.0)
MCV: 84.8 fL (ref 80.0–100.0)
Monocytes Absolute: 1.2 10*3/uL — ABNORMAL HIGH (ref 0.1–1.0)
Monocytes Relative: 10 %
Neutro Abs: 9.1 10*3/uL — ABNORMAL HIGH (ref 1.7–7.7)
Neutrophils Relative %: 79 %
Platelets: 453 10*3/uL — ABNORMAL HIGH (ref 150–400)
RBC: 3.16 MIL/uL — ABNORMAL LOW (ref 4.22–5.81)
RDW: 15.2 % (ref 11.5–15.5)
WBC: 11.6 10*3/uL — ABNORMAL HIGH (ref 4.0–10.5)
nRBC: 0 % (ref 0.0–0.2)

## 2019-01-22 LAB — LACTIC ACID, PLASMA: Lactic Acid, Venous: 0.7 mmol/L (ref 0.5–1.9)

## 2019-01-22 LAB — COMPREHENSIVE METABOLIC PANEL
ALT: 12 U/L (ref 0–44)
AST: 13 U/L — ABNORMAL LOW (ref 15–41)
Albumin: 1.6 g/dL — ABNORMAL LOW (ref 3.5–5.0)
Alkaline Phosphatase: 83 U/L (ref 38–126)
Anion gap: 9 (ref 5–15)
BUN: 14 mg/dL (ref 6–20)
CO2: 26 mmol/L (ref 22–32)
Calcium: 7.5 mg/dL — ABNORMAL LOW (ref 8.9–10.3)
Chloride: 109 mmol/L (ref 98–111)
Creatinine, Ser: 0.43 mg/dL — ABNORMAL LOW (ref 0.61–1.24)
GFR calc Af Amer: >60 mL/min (ref >60)
GFR calc non Af Amer: >60 mL/min (ref >60)
Glucose, Bld: 109 mg/dL — ABNORMAL HIGH (ref 70–99)
Potassium: 2.5 mmol/L — CL (ref 3.5–5.1)
Sodium: 144 mmol/L (ref 135–145)
Total Bilirubin: 0.3 mg/dL (ref 0.3–1.2)
Total Protein: 5.5 g/dL — ABNORMAL LOW (ref 6.5–8.1)

## 2019-01-22 MED ORDER — POTASSIUM CHLORIDE 10 MEQ/100ML IV SOLN
10.0000 meq | INTRAVENOUS | Status: AC
  Administered 2019-01-22 (×2): 10 meq via INTRAVENOUS
  Filled 2019-01-22 (×2): qty 100

## 2019-01-22 MED ORDER — PROMETHAZINE HCL 25 MG/ML IJ SOLN
25.0000 mg | Freq: Four times a day (QID) | INTRAMUSCULAR | Status: DC | PRN
  Administered 2019-01-25 – 2019-01-31 (×4): 25 mg via INTRAVENOUS
  Filled 2019-01-22 (×6): qty 1

## 2019-01-22 MED ORDER — HYDROMORPHONE HCL 1 MG/ML IJ SOLN: 1.0000 mg | INTRAMUSCULAR | Status: DC | PRN

## 2019-01-22 MED ORDER — ENOXAPARIN SODIUM 40 MG/0.4ML ~~LOC~~ SOLN
40.0000 mg | SUBCUTANEOUS | Status: DC
  Administered 2019-01-23 – 2019-02-01 (×10): 40 mg via SUBCUTANEOUS
  Filled 2019-01-22 (×11): qty 0.4

## 2019-01-22 MED ORDER — IOHEXOL 300 MG/ML  SOLN
100.0000 mL | Freq: Once | INTRAMUSCULAR | Status: AC | PRN
  Administered 2019-01-22: 100 mL via INTRAVENOUS

## 2019-01-22 MED ORDER — DIATRIZOATE MEGLUMINE & SODIUM 66-10 % PO SOLN
90.0000 mL | Freq: Once | ORAL | Status: AC
  Administered 2019-01-23: 90 mL via NASOGASTRIC
  Filled 2019-01-22 (×2): qty 90

## 2019-01-22 MED ORDER — POTASSIUM CHLORIDE 2 MEQ/ML IV SOLN
INTRAVENOUS | Status: DC
  Administered 2019-01-22 – 2019-01-24 (×3): via INTRAVENOUS
  Filled 2019-01-22 (×4): qty 1000

## 2019-01-22 MED ORDER — POTASSIUM CHLORIDE 10 MEQ/100ML IV SOLN
10.0000 meq | INTRAVENOUS | Status: DC
  Administered 2019-01-22: 22:00:00 10 meq via INTRAVENOUS
  Filled 2019-01-22: qty 100

## 2019-01-22 MED ORDER — PROMETHAZINE HCL 25 MG/ML IJ SOLN
25.0000 mg | Freq: Once | INTRAMUSCULAR | Status: AC
  Administered 2019-01-22: 13:00:00 25 mg via INTRAVENOUS
  Filled 2019-01-22: qty 1

## 2019-01-22 NOTE — ED Provider Notes (Signed)
  Physical Exam  BP 100/67   Pulse 95   Temp 98.4 F (36.9 C) (Oral)   Resp 20   SpO2 98%   Physical Exam  ED Course/Procedures   Clinical Course as of Jan 21 1710  Tue Jan 22, 2019  1639 Potassium(!!): 2.5 [JS]  1708 Potassium(!!): 2.5 [MT]    Clinical Course User Index [JS] Janeece Fitting, PA-C [MT] Wyvonnia Dusky, MD    Procedures  MDM  53 year old quadriplegic African-American male with history of small bowel obstruction status post colostomy placement July of this year.  Presenting with abdominal pain found to have recurrent bowel obstruction.  General surgery is aware and will consult on the patient but would like hospitalist to admit the patient at this time.  Patient currently stable without any vomiting.  Found to be hypokalemic with a potassium of 2.5.  Currently getting IV potassium.  Shift handoff from [facility].  Waiting on hospitalist callback at this time for admission.     Alveria Apley, PA-C 01/22/19 2340    Daleen Bo, MD 01/23/19 [phone]
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
## 2019-01-22 NOTE — ED Notes (Signed)
johana , PA aware of 2.5 k+ , will be awaiting for further orders
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
## 2019-01-22 NOTE — ED Triage Notes (Signed)
Patient arrived EMS with complaints of abdominal pain. History of bowel obstruction and abscess. Pain reported as sharp 6/10. Reports nausea.

## 2019-01-22 NOTE — ED Notes (Signed)
Per Dr.  Eulis Foster , second set of blood cultures not needed

## 2019-01-23 ENCOUNTER — Inpatient Hospital Stay (HOSPITAL_COMMUNITY): Payer: Medicare Other

## 2019-01-23 DIAGNOSIS — G8250 Quadriplegia, unspecified: Secondary | ICD-10-CM

## 2019-01-23 DIAGNOSIS — Z8719 Personal history of other diseases of the digestive system: Secondary | ICD-10-CM

## 2019-01-23 DIAGNOSIS — E876 Hypokalemia: Secondary | ICD-10-CM

## 2019-01-23 DIAGNOSIS — D649 Anemia, unspecified: Secondary | ICD-10-CM

## 2019-01-23 DIAGNOSIS — S21201A Unspecified open wound of right back wall of thorax without penetration into thoracic cavity, initial encounter: Secondary | ICD-10-CM | POA: Diagnosis present

## 2019-01-23 DIAGNOSIS — K56609 Unspecified intestinal obstruction, unspecified as to partial versus complete obstruction: Secondary | ICD-10-CM

## 2019-01-23 DIAGNOSIS — Z933 Colostomy status: Secondary | ICD-10-CM

## 2019-01-23 LAB — COMPREHENSIVE METABOLIC PANEL
ALT: 12 U/L (ref 0–44)
AST: 13 U/L — ABNORMAL LOW (ref 15–41)
Albumin: 1.5 g/dL — ABNORMAL LOW (ref 3.5–5.0)
Alkaline Phosphatase: 73 U/L (ref 38–126)
Anion gap: 10 (ref 5–15)
BUN: 14 mg/dL (ref 6–20)
CO2: 24 mmol/L (ref 22–32)
Calcium: 7.5 mg/dL — ABNORMAL LOW (ref 8.9–10.3)
Chloride: 111 mmol/L (ref 98–111)
Creatinine, Ser: 0.47 mg/dL — ABNORMAL LOW (ref 0.61–1.24)
GFR calc Af Amer: >60 mL/min (ref >60)
GFR calc non Af Amer: >60 mL/min (ref >60)
Glucose, Bld: 90 mg/dL (ref 70–99)
Potassium: 3.1 mmol/L — ABNORMAL LOW (ref 3.5–5.1)
Sodium: 145 mmol/L (ref 135–145)
Total Bilirubin: 0.6 mg/dL (ref 0.3–1.2)
Total Protein: 5.5 g/dL — ABNORMAL LOW (ref 6.5–8.1)

## 2019-01-23 LAB — CBC
HCT: 27.3 % — ABNORMAL LOW (ref 39.0–52.0)
Hemoglobin: 8.3 g/dL — ABNORMAL LOW (ref 13.0–17.0)
MCH: 25.8 pg — ABNORMAL LOW (ref 26.0–34.0)
MCHC: 30.4 g/dL (ref 30.0–36.0)
MCV: 84.8 fL (ref 80.0–100.0)
Platelets: 414 10*3/uL — ABNORMAL HIGH (ref 150–400)
RBC: 3.22 MIL/uL — ABNORMAL LOW (ref 4.22–5.81)
RDW: 15.3 % (ref 11.5–15.5)
WBC: 13 10*3/uL — ABNORMAL HIGH (ref 4.0–10.5)
nRBC: 0 % (ref 0.0–0.2)

## 2019-01-23 LAB — MAGNESIUM: Magnesium: 1 mg/dL — ABNORMAL LOW (ref 1.7–2.4)

## 2019-01-23 LAB — SARS CORONAVIRUS 2 (TAT 6-24 HRS): SARS Coronavirus 2: NEGATIVE

## 2019-01-23 MED ORDER — POTASSIUM CHLORIDE 10 MEQ/100ML IV SOLN
10.0000 meq | INTRAVENOUS | Status: AC
  Administered 2019-01-23 (×4): 10 meq via INTRAVENOUS
  Filled 2019-01-23 (×4): qty 100

## 2019-01-23 MED ORDER — MAGNESIUM SULFATE 4 GM/100ML IV SOLN
4.0000 g | Freq: Once | INTRAVENOUS | Status: AC
  Administered 2019-01-23: 4 g via INTRAVENOUS
  Filled 2019-01-23: qty 100

## 2019-01-23 MED ORDER — JUVEN PO PACK
1.0000 | PACK | Freq: Two times a day (BID) | ORAL | Status: DC
  Administered 2019-01-23 – 2019-02-01 (×16): 1 via ORAL
  Filled 2019-01-23 (×19): qty 1

## 2019-01-23 MED ORDER — COLLAGENASE 250 UNIT/GM EX OINT
TOPICAL_OINTMENT | Freq: Every day | CUTANEOUS | Status: DC
  Administered 2019-01-23: 1 via TOPICAL
  Administered 2019-01-24 – 2019-01-31 (×8): via TOPICAL
  Administered 2019-02-01: 1 via TOPICAL
  Filled 2019-01-23: qty 30

## 2019-01-23 MED ORDER — LACTATED RINGERS IV BOLUS
500.0000 mL | Freq: Once | INTRAVENOUS | Status: AC
  Administered 2019-01-23: 08:00:00 500 mL via INTRAVENOUS

## 2019-01-23 MED ORDER — SODIUM CHLORIDE 0.9% FLUSH
10.0000 mL | INTRAVENOUS | Status: DC | PRN
  Administered 2019-01-29 – 2019-01-30 (×2): 10 mL
  Filled 2019-01-23 (×2): qty 40

## 2019-01-23 MED ORDER — BOOST / RESOURCE BREEZE PO LIQD CUSTOM
1.0000 | Freq: Three times a day (TID) | ORAL | Status: DC
  Administered 2019-01-24: 09:00:00 1 via ORAL

## 2019-01-23 MED ORDER — SODIUM CHLORIDE 0.9 % IV BOLUS
500.0000 mL | Freq: Once | INTRAVENOUS | Status: AC
  Administered 2019-01-23: 500 mL via INTRAVENOUS

## 2019-01-23 MED ORDER — ALTEPLASE 2 MG IJ SOLR
2.0000 mg | Freq: Once | INTRAMUSCULAR | Status: AC
  Administered 2019-01-23: 2 mg

## 2019-01-23 MED ORDER — ADULT MULTIVITAMIN W/MINERALS CH
1.0000 | ORAL_TABLET | Freq: Every day | ORAL | Status: DC
  Administered 2019-01-23 – 2019-02-01 (×10): 1 via ORAL
  Filled 2019-01-23 (×11): qty 1

## 2019-01-23 NOTE — TOC Initial Note (Signed)
Transition of Care [facility]) - Initial/Assessment Note    Patient Details  Name: RUDELL RUTHERFORD MRN: [id number] Date of Birth: [birth year]  Transition of Care [facility]) CM/SW Contact:    [name], RN Phone Number: 01/23/2019, 12:57 PM  Clinical Narrative:                 Confirmed face sheet information. From home has wheel chair, hospital bed air lost flow mattress. From home with sister and Lynn for [facility] Valerie with [facility] aware of admission will need [facility] with wound care instructions for resumption of care.   Patient will need PTAR transportation home at discharge.  Expected Discharge Plan: [place] Barriers to Discharge: Continued Medical Work up   Patient Goals and CMS Choice Patient states their goals for this hospitalization and ongoing recovery are:: to go home CMS Medicare.gov Compare Post Acute Care list provided to:: Patient Choice offered to / list presented to : Patient  Expected Discharge Plan and Services Expected Discharge Plan: [place]   Discharge Planning Services: CM Consult Post Acute Care Choice: [place] arrangements for the past 2 months: Single Family Home                 DME Arranged: N/A         HH Arranged: RN          Prior Living Arrangements/Services Living arrangements for the past 2 months: Single Family Home Lives with:: Siblings Patient language and need for interpreter reviewed:: Yes Do you feel safe going back to the place where you live?: Yes      Need for Family Participation in Patient Care: Yes (Comment) Care giver support system in place?: Yes (comment) Current home services: DME, Home RN Criminal Activity/Legal Involvement Pertinent to Current Situation/Hospitalization: No - Comment as needed  Activities of Daily Living      Permission Sought/Granted   Permission granted to share information with : Yes, Verbal Permission Granted     Permission granted  to share info w AGENCY: [facility]        Emotional Assessment Appearance:: Appears stated age Attitude/Demeanor/Rapport: Engaged Affect (typically observed): Accepting Orientation: : Oriented to Self, Oriented to Place, Oriented to  Time, Oriented to Situation Alcohol / Substance Use: Not Applicable    Admission diagnosis:  Hypokalemia [E87.6] Small bowel obstruction ([place]) [K56.609] Generalized abdominal pain [R10.84] Encounter for imaging study to confirm nasogastric (NG) tube placement [Z01.89] Patient Active Problem List   Diagnosis Date Noted  . SBO (small bowel obstruction) ([place]) 01/22/2019  . Dyspnea   . Abdominal wall abscess 01/01/2019  . Hypomagnesemia 12/31/2018  . Cellulitis and abscess of right lower extremity 12/30/2018  . Cellulitis of abdominal wall 12/29/2018  . Right lower lobe pneumonia ([place]) 10/18/2018  . Hypoglycemia 10/18/2018  . Sepsis ([place]) 10/14/2018  . Infected pressure ulcer 05/19/2018  . Epigastric pain   . Hematemesis 04/03/2018  . 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 Chest wall mass   . Complicated UTI (urinary tract infection) 06/02/2017  . UTI (urinary tract infection) due to urinary indwelling catheter ([place]) 05/07/2017  . Recurrent UTI 05/04/2017  . Quadriplegia, C5-C7, complete ([place]) 05/03/2017  . Sacral decubitus ulcer 02/28/2017  . Coffee ground emesis 08/27/2016  . Gastroparesis 10/08/2015  . Non-intractable vomiting with nausea   . Osteomyelitis of thoracic region [facility])   . Pressure injury of skin 07/08/2015  . Palliative care encounter 06/03/2015  .  Hypokalemia   . Anemia of chronic disease 04/27/2015  . Iron deficiency anemia 04/27/2015  . Chronic constipation 03/16/2015  . Pressure ulcer of right upper back 06/18/2014  . Severe protein-calorie malnutrition ([place]) 03/25/2013  . Normocytic anemia 08/07/2012  . Personal history of other (healed) physical injury and trauma 08/07/2012  . OSA on CPAP  07/11/2012  . Sacral decubitus ulcer, stage IV ([place]) 04/22/2012  . S/P colostomy ([place]) 04/22/2012  . Quadriplegia ([place]) 07/23/2011  . Obesity 07/19/2011  . PVD 03/11/2010   PCP:  Andria Frames, PA-C Pharmacy:   [id number] - [place], [place] [address] Phone: [phone] Fax: [place], [place] [address] Phone: [phone] Fax: [phone]     Social Determinants of Health (SDOH) Interventions    Readmission Risk Interventions Readmission Risk Prevention Plan 01/04/2019 10/15/2018  Transportation Screening Complete Complete  PCP or Specialist Appt within 5-7 Days Complete -  PCP or Specialist Appt within 3-5 Days - Not Complete  Not Complete comments - not yet ready for d/c  Home Care Screening Complete -  Medication Review (RN CM) Complete -  HRI or [place] - Complete  Social Work Consult for [place] Planning/Counseling - Complete  Palliative Care Screening - Not Applicable  Medication Review (RN Care Manager) - Complete  Some recent data might be hidden

## 2019-01-23 NOTE — Progress Notes (Signed)
Called d/t MEWS-4: HR-108, BP-67/59. Per RN pt is asymptomatic. MD notified by bedside RN and wants to monitor pt. Please increase frequency of VS per RED MEWS guidelines and call RRT if assistance needed.

## 2019-01-23 NOTE — Progress Notes (Signed)
Patient's B/P has improved; 577mL LR Bolus given per MD order; MEWS score changed from red to yellow; Vitals signs taken q4 hours per yellow mews protocol. Will continue to monitor.

## 2019-01-23 NOTE — Progress Notes (Signed)
Initial Nutrition Assessment  DOCUMENTATION CODES:   Not applicable  INTERVENTION:   -MVI with minerals daily -Boost Breeze po TID, each supplement provides 250 kcal and 9 grams of protein -1 packet Juven BID, each packet provides 95 calories, 2.5 grams of protein (collagen), and 9.8 grams of carbohydrate (3 grams sugar); also contains 7 grams of L-arginine and L-glutamine, 300 mg vitamin C, 15 mg vitamin E, 1.2 mcg vitamin B-12, 9.5 mg zinc, 200 mg calcium, and 1.5 g  Calcium Beta-hydroxy-Beta-methylbutyrate to support wound healing  NUTRITION DIAGNOSIS:   Increased nutrient needs related to wound healing as evidenced by estimated needs.  GOAL:   Patient will meet greater than or equal to 90% of their needs  MONITOR:   PO intake, Supplement acceptance, Diet advancement, Labs, Weight trends, Skin, I & O's  REASON FOR ASSESSMENT:   Low [place]    ASSESSMENT:   [name] [name] is a 53 year old gentleman with a past medical history notable for quadriplegia secondary to C5 SCI in [place], gastroparesis, chronic anemia, status post ex lap with partial bowel resection and diverting colostomy placement for necrotic bowel and decubitus sacral ulcer in May 2020 at [facility], and chronic osteomyelitis of the sacrum who presented to the ED for acute episode of nausea vomiting and abdominal distention.  The patient stated that "I have a small bowel obstruction."  The patient stated that in addition to the nausea and abdominal distention he experienced dry heaves and decreased stoma output.  His last bowel movement was this morning.  He denies fever, chills, headache, vision changes, myalgias or confusion.  Patient denies changes in his dietary habits or activity.  Pt admitted with SBO.   9/8- NGT inserted 9/9- NGT d/c, advanced to clear liquids  Reviewed I/O's: -574 ml x 24 hours   UOP: 700 ml x 24 hours  NGT output: 1.2 L x 24 hours  Pt on contact precautions due to  infections. RD did not enter room in an effort to preserve PPE.  Pt very familiar to this RD due to multiple prior admissions. He uses Juven PTA to help facilitate wound healing.   Wt has been stable over the past year.   Medications reviewed and include lactated ringers @ 100 ml/hr and and IV KCl.   Labs reviewed: K: 3.1, Mg: 1.0.   Diet Order:   Diet Order            Diet clear liquid Room service appropriate? Yes; Fluid consistency: Thin  Diet effective now              EDUCATION NEEDS:   Not appropriate for education at this time  Skin:  Skin Integrity Issues:: Stage III, Stage IV Stage II: - Stage III: rt thigh Stage IV: rt and lt saacrum Other: -  Last BM:  01/23/19 (900 ml output via ostomy)  Height:   Ht Readings from Last 1 Encounters:  01/23/19 6' (1.829 m)    Weight:   Wt Readings from Last 1 Encounters:  01/23/19 89.5 kg    Ideal Body Weight:  70.8 kg(adjusted for quadriplegia)  BMI:  Body mass index is 26.76 kg/m.  Estimated Nutritional Needs:   Kcal:  1900-2100  Protein:  105-120 grams  Fluid:  > 1.9 L    Yarethzi Branan A. Jimmye Norman, RD, LDN, [place] Registered Dietitian II Certified Diabetes Care and Education Specialist Pager: [phone] After hours Pager: [phone]

## 2019-01-23 NOTE — Progress Notes (Signed)
Mía.Alanis Notified MD and RRT for pt's RED MEWS score, pt asymptomatic and will continue to monitor.   0435 MD went to see pt, gave verbal order for fluid bolus.  0444 Given Nacl bolus 500 ml, pt still asymptomatic,  MEWS yellow now, will continue to monitor.

## 2019-01-23 NOTE — Progress Notes (Signed)
    CC: Abdominal pain  Subjective: Patient is still slightly distended but soft.  His NG tube has a large hole at the connection of the sump so it is not working.  He reports it was emptied earlier.  Currently there is a little bit of fluid at the base but is otherwise empty.  The ostomy looks fine. Objective: Vital signs in last 24 hours: Temp:  [98.4 F (36.9 C)-99.2 F (37.3 C)] 98.7 F (37.1 C) (09/09 0604) Pulse Rate:  [95-110] 98 (09/09 0604) Resp:  [16-20] 18 (09/09 0604) BP: (67-108)/(23-86) 77/55 (09/09 0604) SpO2:  [96 %-100 %] 100 % (09/09 0604) Last BM Date: 01/22/19 Afebrile, BP low 120's >> 77/55 this AM K+ 3.1/Mag 1.0 WBC 13.0 Film pending for SBP 626 IV recorded 1200 NG recorded yesterday 900 recorded this a.m. from the ostomy bag which has been emptied. 700 urine recorded overnight. There is no stool and significant gas in the ostomy bag.  There is a little bit of liquid at the base of the bag.  Intake/Output from previous day: 09/08 0701 - 09/09 0700 In: 626.5 [I.V.:526.5; IV Piggyback:100] Out: 1200 [Emesis/NG output:1200] Intake/Output this shift: Total I/O In: 526.5 [I.V.:526.5] Out: 1200 [Emesis/NG output:1200]  General appearance: alert, cooperative and no distress GI: Soft, minimal distention, ostomy bag is been emptied this a.m.  Lab Results:  Recent Labs    01/22/19 1322 01/23/19 0325  WBC 11.6* 13.0*  HGB 8.1* 8.3*  HCT 26.8* 27.3*  PLT 453* 414*    BMET Recent Labs    01/22/19 1322 01/23/19 0325  NA 144 145  K 2.5* 3.1*  CL 109 111  CO2 26 24  GLUCOSE 109* 90  BUN 14 14  CREATININE 0.43* 0.47*  CALCIUM 7.5* 7.5*   PT/INR No results for input(s): LABPROT, INR in the last 72 hours.  Recent Labs  Lab 01/22/19 1322 01/23/19 0325  AST 13* 13*  ALT 12 12  ALKPHOS 83 73  BILITOT 0.3 0.6  PROT 5.5* 5.5*  ALBUMIN 1.6* 1.5*     Lipase     Component Value Date/Time   LIPASE 36 10/14/2018 0150     Medications: .  enoxaparin (LOVENOX) injection  40 mg Subcutaneous Q24H   . lactated ringers with kcl 100 mL/hr at 01/23/19 0300  . magnesium sulfate bolus IVPB 4 g (01/23/19 0539)  . potassium chloride 10 mEq (01/23/19 [id number])    Assessment/Plan Acute on chronic hypoxic respiratory failure from aspiration pneumonia Hx OSA Septic shock from pneumonia with volume depletion CKD Neurogenic bladder with chronic suprapubic catheter Quadriplegia Chronic sacral decubitus with osteomyelitis Hypokalemia Hypomagnesemia   Recurrent SBO  Exploratory laparotomy and resection of necrotic bowel 09/2018,[facility] SBO 10/15/18  FEN:  NPO/IV fluids ID: None DVT: Lovenox Follow up:  TBD   Plan: Film is due shortly, and I expect there will be contrast in the colon.  If that is correct we will pull his NG, and start him on clears.  Medicine is replacing his potassium and magnesium.  I will defer fluids to them management of medical issues.    LOS: 1 day    Chana Lindstrom 01/23/2019 [phone]

## 2019-01-23 NOTE — Progress Notes (Signed)
Paged for patient have MAP 62. Repeat was 63. He is paraplegic with recurrent SBOs and end ostomy. On seeing patient he states he is feeling much better than he was earlier this evening. He denies chills, fever, shortness of breath, abdominal pain or other pain, nausea, chest pain, or dizziness. He has passed a large amount of stool into his ostomy bag since arriving to the hospital. BP 74/58, HR 107, Sats 99% on RA, afebrile. Lung sounds are clear. Abd NTTP, soft. Trace edema in the LE. NG tube is in place and draining.  Patient states that his blood pressure usually runs low and baseline is often in the 0000000 systolic. He is likely somewhat dehydrated as he states he has not had anything to eat or drink for two days due to his SBO. He also appears to have increased ostomy output since admission as his ostomy bag is completely full. He has been receiving fluids but will provide extra NS bolus.   - NS half liter - hold dilaudid, currently not having any pain   [name] [name] A, DO 01/23/2019, 5:20 AM Pager: [phone]

## 2019-01-23 NOTE — Progress Notes (Signed)
  Subjective:  Patient reports he is doing okay today.  Patient denies any pain.  Patient asked about ulcer on his right side, says he sees wound care for his maintenance.  No acute complaints.  All questions answered.   Objective:    Vital Signs (last 24 hours): Vitals:   01/23/19 1102 01/23/19 1325 01/23/19 1402 01/23/19 1428  BP: (!) 89/61 (!) 83/51 (!) 96/56   Pulse: 95 97 95   Resp: 16 18    Temp: 97.9 F (36.6 C) 98.7 F (37.1 C)    TempSrc: Oral Oral    SpO2: 100% 100% 99%   Weight:    89.5 kg  Height:    6' (1.829 m)    Physical Exam: General Alert and answers questions appropriately, no acute distress  Cardiac Regular rate and rhythm, no murmurs, rubs, or gallops  Abdominal Soft, with only mild distention. Ostomy in place draining fecal material. Suprapubic catheter in place. NG tube with significant drainage.  Extremities Contraction of upper extremities     Assessment/Plan:   Principal Problem:   SBO (small bowel obstruction) ([name] Active Problems:   Quadriplegia ([name]   S/P colostomy ([name]   Severe protein-calorie malnutrition ([name]   Open abdominal wall wound  Patient is a 53 year old male with past medical history for quadriplegia and recurrent SBO's.  Who presented yesterday with symptoms consistent with SBO and CT findings confirmed SBO.  There are no overt signs of sepsis or indication for antibiotics at this time.  # SBO: Surgery was consulted and they do not feel surgical intervention is indicated at this time.  Patient with improvement and abdominal distention.  NG tube still with significant drainage indicating need for continued decompression.  We will continue with current therapy and evaluate progress. * Continue intermittent NG tube suction * Continue LR IV fluids * Clear liquid diet * Repeat abdominal x-ray tomorrow * Patient without pain due to spinal cord injury * BMP ordered for 11 PM, will replace electrolytes as needed  # Hypokalemia:  Most likely due to SBO.  We will continue replete his electrolytes as needed.  BMP ordered for 11 PM.  # Anemia: Hemoglobin stable at 8.3 from 8.1 yesterday  Diet: Clear liquid DVT Ppx: SCDs Dispo: Anticipated discharge pending clinical improvement  Jeanmarie Hubert, MD 01/23/2019, 4:31 PM Pager: [phone]

## 2019-01-23 NOTE — Consult Note (Addendum)
[place] Nurse wound consult note  Reason for Consult:Chronic full thickness pressure inuries to right posterior flank, Sacrum, buttocks and bilateral ischial tuberosities. Left hip and scattered nonhealing abrasions to feet and lower legs. Wound type:Nonhealing pressure injuries Pressure Injury POA: Yes Measurement:Right thoracic area Stage 4: 12cm x4cm x 0.4cm with 80% red, 20 % yellow tissue. Minimal serosanguinous Sacrum/bilateral ischial tuberosity/perineal area and scrotum Stage 4: Unchanged from last admission. Irregularly shaped wound measuring approximately 22cm x 30cm x 0.2cm. Friable. 1 cm patch of eschar at 7 o'clock. Increased drainage and odor present.  Left hip Stage 3 Wound:7cm x 6cm x 0.8cm with 80% pink, 20%sloughfrom 12-3 o' clock. Undermining measuring 1.5cm from 9-12 o'clock. Strong odor and moderate exudate noted upon dressing removal.  Right flank, was abscess last admission.  Now chronic nonhealing wound:  4 cm x 5 cm x 1 cm  Right anterior thigh:  3 cm x 2 cm 100% adherent slough Right Heel with unstageble, dry stable eschar Wound bed:As described above Drainage (amount, consistency, odor)Moderate serosanguinous Musty odorDressings saturated upon my assessment. Periwound:intact, dry and with evidence of previous wound healing Dressing procedure/placement/frequency:  Cleanse wounds to right anterior thigh with NS and pat dry.  Apply Santyl to wound bed. Cover with NS moist gauze.  Secure with dry gauze and ABD pad  Change daily.  Cleanse wounds to right thoracic spine, sacrum, left hip, right flank with NS.  Fill dead space with Aquacel AG to wound bed and top with fluffed gauze if needed.  Secure with dry dressing and tape.  Change daily.  Turn and reposition every two hours.  Float heels.   A mattress replacement with low air loss feature   WOC Nurse ostomy consultnote Stoma type/location:LLQ colostomy (long standing)Liquid brown stool present in  pouch.  Stomal assessment/size:1 and 5/8 inches round, slightly raised, pale pink and moist. Peristomal assessment:Skin intact, hyperpigmented.   Treatment options for stomal/peristomal skin:barrier ring Output: Liquid brown stool,  Ostomy pouching: 2pc., 2 and 3/4 inch pouching system.  Education provided:None required. Bedside RN will provide wound care.  [place] team will monitor weekly.  Domenic Moras MSN, RN, FNP-BC CWON Wound, Ostomy, Continence Nurse Pager [phone]

## 2019-01-24 ENCOUNTER — Inpatient Hospital Stay (HOSPITAL_COMMUNITY): Payer: Medicare Other

## 2019-01-24 DIAGNOSIS — E43 Unspecified severe protein-calorie malnutrition: Secondary | ICD-10-CM

## 2019-01-24 LAB — BASIC METABOLIC PANEL
Anion gap: 5 (ref 5–15)
Anion gap: 6 (ref 5–15)
BUN: 16 mg/dL (ref 6–20)
BUN: 15 mg/dL (ref 6–20)
CO2: 24 mmol/L (ref 22–32)
CO2: 25 mmol/L (ref 22–32)
Calcium: 7.6 mg/dL — ABNORMAL LOW (ref 8.9–10.3)
Calcium: 7.5 mg/dL — ABNORMAL LOW (ref 8.9–10.3)
Chloride: 111 mmol/L (ref 98–111)
Chloride: 112 mmol/L — ABNORMAL HIGH (ref 98–111)
Creatinine, Ser: <0.3 mg/dL — ABNORMAL LOW (ref 0.61–1.24)
Creatinine, Ser: <0.3 mg/dL — ABNORMAL LOW (ref 0.61–1.24)
Glucose, Bld: 97 mg/dL (ref 70–99)
Glucose, Bld: 127 mg/dL — ABNORMAL HIGH (ref 70–99)
Potassium: 4.1 mmol/L (ref 3.5–5.1)
Potassium: 3.7 mmol/L (ref 3.5–5.1)
Sodium: 140 mmol/L (ref 135–145)
Sodium: 143 mmol/L (ref 135–145)

## 2019-01-24 LAB — CBC
HCT: 24.6 % — ABNORMAL LOW (ref 39.0–52.0)
Hemoglobin: 7.4 g/dL — ABNORMAL LOW (ref 13.0–17.0)
MCH: 25.8 pg — ABNORMAL LOW (ref 26.0–34.0)
MCHC: 30.1 g/dL (ref 30.0–36.0)
MCV: 85.7 fL (ref 80.0–100.0)
Platelets: 453 10*3/uL — ABNORMAL HIGH (ref 150–400)
RBC: 2.87 MIL/uL — ABNORMAL LOW (ref 4.22–5.81)
RDW: 15.7 % — ABNORMAL HIGH (ref 11.5–15.5)
WBC: 10.2 10*3/uL (ref 4.0–10.5)
nRBC: 0 % (ref 0.0–0.2)

## 2019-01-24 LAB — MAGNESIUM
Magnesium: 1.7 mg/dL (ref 1.7–2.4)
Magnesium: 1.7 mg/dL (ref 1.7–2.4)

## 2019-01-24 MED ORDER — MAGNESIUM SULFATE 50 % IJ SOLN
3.0000 g | Freq: Once | INTRAVENOUS | Status: AC
  Administered 2019-01-24: 09:00:00 3 g via INTRAVENOUS
  Filled 2019-01-24: qty 6

## 2019-01-24 MED ORDER — PRO-STAT SUGAR FREE PO LIQD
30.0000 mL | Freq: Two times a day (BID) | ORAL | Status: DC
  Administered 2019-01-24 – 2019-02-01 (×9): 30 mL via ORAL
  Filled 2019-01-24 (×16): qty 30

## 2019-01-24 MED ORDER — PANTOPRAZOLE SODIUM 20 MG PO TBEC
20.0000 mg | DELAYED_RELEASE_TABLET | Freq: Every day | ORAL | Status: DC
  Administered 2019-01-24 – 2019-02-01 (×9): 20 mg via ORAL
  Filled 2019-01-24 (×10): qty 1

## 2019-01-24 NOTE — Progress Notes (Signed)
Internal [phone]) notified of MEWS 2.

## 2019-01-24 NOTE — Progress Notes (Signed)
Paged to patient's room regarding recent vitals. BP 86/55, 108 HR, and temp of 100F.  Earlier today [phone]) the patient had a temperature of 100.3.  He admits to chills and shivering at roughly 1600h.  Subsequently admits to decreased appetite and mild shortness of breath which he believes is due to gastritis.  Asked to be put back on his home PPI.  After taking Protonix 20 mg his appetite returned and was able to eat some ice cream.  Patient admits to a history of low blood pressure and he his tachycardia is stable over the last few days. No currently having chills or shaking.  Heart sounds - RRR, S1/S2 present, no murmurs, rubs, gallops. Breath sounds clear to ascultation bilaterally. Abdomen is distended but admits to improved tenderness. Recent ostomy output of 50 cc. Asked the nurse to repeat his temperature at 0013h which was 99.56F. Reviewed dayshift notes which state no evidence of current infectious processes.  Blood cultures were collected yesterday.  We will trend his temperature and follow [name] [name] closely.  Marianna Payment, D.O. PGY-1

## 2019-01-24 NOTE — Progress Notes (Signed)
  Subjective:  Patient seen this morning on rounds.  Patient says he feels well this morning and thinks his abdomen is less distended.  Patient said while he does not have pain, he often develops nausea when his bowels get obstructed.  Patient endorses nausea prior to presentation but says it has gone away.   Objective:    Vital Signs (last 24 hours): Vitals:   01/23/19 1402 01/23/19 1428 01/23/19 2256 01/24/19 0435  BP: (!) 96/56  (!) 85/56 (!) 75/55  Pulse: 95  97 99  Resp:   18 20  Temp:   99.1 F (37.3 C) 98.6 F (37 C)  TempSrc:   Oral Oral  SpO2: 99%  97% 98%  Weight:  89.5 kg    Height:  6' (1.829 m)      Physical Exam: General Alert and answers questions appropriately, no acute distress  Cardiac Regular rate and rhythm, no murmurs, rubs, or gallops  Pulmonary Clear to auscultation bilaterally without wheezes, rhonchi, or rales  Abdominal Soft, without significant distention      Assessment/Plan:   Principal Problem:   SBO (small bowel obstruction) ([name] Active Problems:   Quadriplegia ([name]   S/P colostomy ([name]   Severe protein-calorie malnutrition ([name]   Open abdominal wall wound  Patient is a 53 year old male with past medical history of quadriplegia and recurrent SBO's.  He presented yesterday with symptoms consistent with SBO and CT findings confirmed SBO.  # SBO: Surgery was consulted and there was not an indication for surgical intervention.  Patient has had improvement of abdominal distention.  Per order history, nasogastric tube was removed yesterday morning.  Patient has tolerated a clear liquid diet, advancing to soft diet today.  Abdominal radiograph this AM shows improving partial distal small bowel obstruction. * LR 100 mL/hr with 40 mEq K/1000 mL * Potassium of 3.7 this AM, Mag 1.7, repleting * Phenergan PRN for nausea, patient without pain due to spinal cord injury * Will repeat BMP, Mag in AM * Surgery following, we appreciate their continued  recommendations   Diet: Soft diet DVT Ppx: Lovenox Dispo: Anticipated discharge in 1 to 2 days.  Jeanmarie Hubert, MD 01/24/2019, 10:57 AM Pager: [phone]

## 2019-01-24 NOTE — Plan of Care (Signed)
?  Problem: Nutrition: ?Goal: Adequate nutrition will be maintained ?Outcome: Progressing ?  ?Problem: Elimination: ?Goal: Will not experience complications related to bowel motility ?Outcome: Progressing ?Goal: Will not experience complications related to urinary retention ?Outcome: Progressing ?  ?Problem: Safety: ?Goal: Ability to remain free from injury will improve ?Outcome: Progressing ?  ?Problem: Skin Integrity: ?Goal: Risk for impaired skin integrity will decrease ?Outcome: Progressing ?  ?

## 2019-01-24 NOTE — Progress Notes (Signed)
MD on call paged patient BP 77/55 HR 99 patient was asleep asymptomatic. Arthor Captain LPN

## 2019-01-24 NOTE — Progress Notes (Signed)
Nutrition Follow-up  RD working remotely.  DOCUMENTATION CODES:   Not applicable  INTERVENTION:   -D/c Boost Breeze po TID, each supplement provides 250 kcal and 9 grams of protein -30 ml Prostat BID, each supplement provides 100 kcals and 15 grams protein -Continue MVI with minerals daily -Continue 1 packet Juven BID, each packet provides 95 calories, 2.5 grams of protein (collagen), and 9.8 grams of carbohydrate (3 grams sugar); also contains 7 grams of L-arginine and L-glutamine, 300 mg vitamin C, 15 mg vitamin E, 1.2 mcg vitamin B-12, 9.5 mg zinc, 200 mg calcium, and 1.5 g  Calcium Beta-hydroxy-Beta-methylbutyrate to support wound healing  NUTRITION DIAGNOSIS:   Increased nutrient needs related to wound healing as evidenced by estimated needs.  Ongoing  GOAL:   Patient will meet greater than or equal to 90% of their needs  Progressing   MONITOR:   PO intake, Supplement acceptance, Diet advancement, Labs, Weight trends, Skin, I & O's  REASON FOR ASSESSMENT:   Low Braden    ASSESSMENT:   [name] [name] is a 53 year old gentleman with a past medical history notable for quadriplegia secondary to C5 SCI in [place], gastroparesis, chronic anemia, status post ex lap with partial bowel resection and diverting colostomy placement for necrotic bowel and decubitus sacral ulcer in May 2020 at [facility], and chronic osteomyelitis of the sacrum who presented to the ED for acute episode of nausea vomiting and abdominal distention.  The patient stated that "I have a small bowel obstruction."  The patient stated that in addition to the nausea and abdominal distention he experienced dry heaves and decreased stoma output.  His last bowel movement was this morning.  He denies fever, chills, headache, vision changes, myalgias or confusion.  Patient denies changes in his dietary habits or activity.  9/8- NGT inserted 9/9- NGT d/c, advanced to clear liquids 9/10- advanced to  soft diet  Reviewed I/O's: -717 ml x 24 hours and -1.3 L since admission  UOP: 1.5 L x 24 hours  Ostomy output: 1.4 L x 24 hours  Per [facility]OCN note from 01/23/19, pt with multiple pressure injuries: stage 4 rt thoracic area, stage 4 to sacrum/bilateral ischial tuberosity/Perineal area/ scrotum, stage 3 lt hip, chronic non-healing rt flank wound, and unstageable rt heel wound with eschar.   Pt just advanced to soft diet. He takes Juven PTA to support wound healing.   Labs reviewed.   Diet Order:   Diet Order            DIET SOFT Room service appropriate? Yes; Fluid consistency: Thin  Diet effective now              EDUCATION NEEDS:   Not appropriate for education at this time  Skin:  Skin Integrity Issues:: Stage IV, Unstageable, Stage III, Other (Comment) Stage II: - Stage III: lt hip Stage IV: rt thoracic area, sacrum, bilateral ischial tuberoisty, perineal area, scrotum Unstageable: rt heel with eschar Other: chronic non-healing rt flank wound  Last BM:  01/24/19 (75 ml output via colostomy)  Height:   Ht Readings from Last 1 Encounters:  01/23/19 6' (1.829 m)    Weight:   Wt Readings from Last 1 Encounters:  01/23/19 89.5 kg    Ideal Body Weight:  70.8 kg(adjusted for quadriplegia)  BMI:  Body mass index is 26.76 kg/m.  Estimated Nutritional Needs:   Kcal:  1900-2100  Protein:  105-120 grams  Fluid:  > 1.9 L    Marquies Wanat A.  Jimmye Norman, RD, LDN, [place] Registered Dietitian II Certified Diabetes Care and Education Specialist Pager: [phone] After hours Pager: [phone]

## 2019-01-24 NOTE — Significant Event (Signed)
Rapid Response Event Note  Overview: Called d/t MEWS-2, BP-75/55 MAP-63, pt asymptomatic. This is not an acute change. MD notified by bedside RN, awaiting orders. Call RRT if assistance needed.  Dillard Essex

## 2019-01-24 NOTE — Progress Notes (Signed)
  Progress Note: General Surgery Service   Assessment/Plan: Principal Problem:   SBO (small bowel obstruction) ([name] Active Problems:   Quadriplegia ([place])   S/P colostomy ([place])   Severe protein-calorie malnutrition ([name]   Open abdominal wall wound  Recurrent SBO  Exploratory laparotomy and resection of necrotic bowel 09/2018,[facility] SBO 10/15/18 -doing well with return of bowel function, tolerating liquids -would not get additional XR unless change in symptoms  FEN:  clear liquids, advance once hypotension work up done ID: None DVT: Lovenox Follow up:  TBD     LOS: 2 days  Chief Complaint/Subjective: Tolerating liquids  Objective: Vital signs in last 24 hours: Temp:  [97.9 F (36.6 C)-99.1 F (37.3 C)] 98.6 F (37 C) (09/10 0435) Pulse Rate:  [95-99] 99 (09/10 0435) Resp:  [16-20] 20 (09/10 0435) BP: (75-96)/(51-61) 75/55 (09/10 0435) SpO2:  [97 %-100 %] 98 % (09/10 0435) Weight:  [89.5 kg] 89.5 kg (09/09 1428) Last BM Date: 01/23/19  Intake/Output from previous day: 09/09 0701 - 09/10 0700 In: 2107.9 [P.O.:540; I.V.:1167.9; IV Piggyback:400] Out: 2825 [Urine:1450; [id number] Intake/Output this shift: No intake/output data recorded.  Lungs: nonlabored  Cardiovascular: RRR  Abd: soft, ostomy with semisolid stool  Extremities: sarcopenic  Neuro: alert, answers appropriate questions  Lab Results: CBC  Recent Labs    01/23/19 0325 01/24/19 0414  WBC 13.0* 10.2  HGB 8.3* 7.4*  HCT 27.3* 24.6*  PLT 414* 453*   BMET Recent Labs    01/24/19 0050 01/24/19 0414  NA 140 143  K 4.1 3.7  CL 111 112*  CO2 24 25  GLUCOSE 97 127*  BUN 16 15  CREATININE <0.30* <0.30*  CALCIUM 7.6* 7.5*   PT/INR No results for input(s): LABPROT, INR in the last 72 hours. ABG No results for input(s): PHART, HCO3 in the last 72 hours.  Invalid input(s): PCO2, PO2  Studies/Results:  Anti-infectives: Anti-infectives (From admission, onward)   None       Medications: Scheduled Meds: . collagenase   Topical Daily  . enoxaparin (LOVENOX) injection  40 mg Subcutaneous Q24H  . feeding supplement  1 Container Oral TID WC  . multivitamin with minerals  1 tablet Oral Daily  . nutrition supplement (JUVEN)  1 packet Oral BID BM   Continuous Infusions: . lactated ringers with kcl 100 mL/hr at 01/23/19 1500  . magnesium sulfate bolus IVPB     PRN Meds:.promethazine, sodium chloride flush  Mickeal Skinner, MD [facility]

## 2019-01-25 ENCOUNTER — Encounter (HOSPITAL_COMMUNITY): Payer: Self-pay | Admitting: General Practice

## 2019-01-25 DIAGNOSIS — B9689 Other specified bacterial agents as the cause of diseases classified elsewhere: Secondary | ICD-10-CM

## 2019-01-25 DIAGNOSIS — L02211 Cutaneous abscess of abdominal wall: Secondary | ICD-10-CM

## 2019-01-25 DIAGNOSIS — Z1612 Extended spectrum beta lactamase (ESBL) resistance: Secondary | ICD-10-CM

## 2019-01-25 LAB — BASIC METABOLIC PANEL
Anion gap: 6 (ref 5–15)
BUN: 15 mg/dL (ref 6–20)
CO2: 25 mmol/L (ref 22–32)
Calcium: 7.9 mg/dL — ABNORMAL LOW (ref 8.9–10.3)
Chloride: 110 mmol/L (ref 98–111)
Creatinine, Ser: 0.33 mg/dL — ABNORMAL LOW (ref 0.61–1.24)
GFR calc Af Amer: >60 mL/min (ref >60)
GFR calc non Af Amer: >60 mL/min (ref >60)
Glucose, Bld: 90 mg/dL (ref 70–99)
Potassium: 4.3 mmol/L (ref 3.5–5.1)
Sodium: 141 mmol/L (ref 135–145)

## 2019-01-25 LAB — CBC
HCT: 26.4 % — ABNORMAL LOW (ref 39.0–52.0)
Hemoglobin: 7.7 g/dL — ABNORMAL LOW (ref 13.0–17.0)
MCH: 25.6 pg — ABNORMAL LOW (ref 26.0–34.0)
MCHC: 29.2 g/dL — ABNORMAL LOW (ref 30.0–36.0)
MCV: 87.7 fL (ref 80.0–100.0)
Platelets: 460 10*3/uL — ABNORMAL HIGH (ref 150–400)
RBC: 3.01 MIL/uL — ABNORMAL LOW (ref 4.22–5.81)
RDW: 15.6 % — ABNORMAL HIGH (ref 11.5–15.5)
WBC: 12.4 10*3/uL — ABNORMAL HIGH (ref 4.0–10.5)
nRBC: 0 % (ref 0.0–0.2)

## 2019-01-25 LAB — CORTISOL: Cortisol, Plasma: 7.8 ug/dL

## 2019-01-25 LAB — MAGNESIUM: Magnesium: 1.7 mg/dL (ref 1.7–2.4)

## 2019-01-25 MED ORDER — SODIUM CHLORIDE 0.9 % IV SOLN
INTRAVENOUS | Status: AC
  Administered 2019-01-25 – 2019-01-26 (×3): via INTRAVENOUS

## 2019-01-25 MED ORDER — VANCOMYCIN HCL IN DEXTROSE 1-5 GM/200ML-% IV SOLN
1000.0000 mg | INTRAVENOUS | Status: DC
  Administered 2019-01-26 – 2019-01-28 (×3): 1000 mg via INTRAVENOUS
  Filled 2019-01-25 (×3): qty 200

## 2019-01-25 MED ORDER — FESOTERODINE FUMARATE ER 8 MG PO TB24
8.0000 mg | ORAL_TABLET | Freq: Every day | ORAL | Status: DC
  Administered 2019-01-25 – 2019-02-01 (×8): 8 mg via ORAL
  Filled 2019-01-25 (×9): qty 1

## 2019-01-25 MED ORDER — SUCRALFATE 1 G PO TABS
1.0000 g | ORAL_TABLET | Freq: Three times a day (TID) | ORAL | Status: DC
  Administered 2019-01-25 – 2019-02-01 (×30): 1 g via ORAL
  Filled 2019-01-25 (×30): qty 1

## 2019-01-25 MED ORDER — SODIUM CHLORIDE 0.9 % IV SOLN
1.0000 g | Freq: Three times a day (TID) | INTRAVENOUS | Status: DC
  Administered 2019-01-25 – 2019-02-01 (×22): 1 g via INTRAVENOUS
  Filled 2019-01-25 (×25): qty 1

## 2019-01-25 MED ORDER — VANCOMYCIN HCL 10 G IV SOLR
1500.0000 mg | Freq: Once | INTRAVENOUS | Status: AC
  Administered 2019-01-25: 1500 mg via INTRAVENOUS
  Filled 2019-01-25: qty 1500

## 2019-01-25 MED ORDER — INFLUENZA VAC SPLIT QUAD 0.5 ML IM SUSY
0.5000 mL | PREFILLED_SYRINGE | INTRAMUSCULAR | Status: AC
  Administered 2019-01-26: 10:00:00 0.5 mL via INTRAMUSCULAR
  Filled 2019-01-25: qty 0.5

## 2019-01-25 MED ORDER — MAGNESIUM SULFATE 50 % IJ SOLN
3.0000 g | Freq: Once | INTRAVENOUS | Status: AC
  Administered 2019-01-25: 3 g via INTRAVENOUS
  Filled 2019-01-25: qty 6

## 2019-01-25 NOTE — Progress Notes (Signed)
Pharmacy Antibiotic Note  [name] [name] is a 53 y.o. male admitted on 01/22/2019 with SBO, which has resolved. BP down and HR up. Pharmacy has been consulted for Meropenem and Vancomycin dosing for wound coverage. Hx open abdominal wall wound; prior ESBL E coli 12/30/18, treated with Meropenem as inpatient then Invanz as outpatient for total 7 days.   Quadriplegic, so creatinine not expected to correspond to drug clearance. Prior AKI due to Vancomycin (2018).   Plan:  Meropenem 1gm IV q8hrs.  Vancomycin 1500 mg IV x 1, then 1 gm IV q24hrs.  Conservative Vancomycin dosing d/t hx AKI.  Follow renal function, culture data, clinical progress and antibiotic plans  Target Vanc troughs 10-15 mcg/ml.  Height: 6' (182.9 cm) Weight: 197 lb 5 oz (89.5 kg)(from 12/30/18) IBW/kg (Calculated) : 77.6  Temp (24hrs), Avg:99.5 F (37.5 C), Min:98.7 F (37.1 C), Max:100.3 F (37.9 C)  Recent Labs  Lab 01/22/19 1315 01/22/19 1322 01/23/19 0325 01/24/19 0050 01/24/19 0414 01/25/19 0336 01/25/19 0942  WBC  --  11.6* 13.0*  --  10.2  --  12.4*  CREATININE  --  0.43* 0.47* <0.30* <0.30* 0.33*  --   LATICACIDVEN 0.7  --   --   --   --   --   --     Estimated Creatinine Clearance: 118.6 mL/min (A) (by C-G formula based on SCr of 0.33 mg/dL (L)).    Allergies  Allergen Reactions  . Ferumoxytol Anaphylaxis and Other (See Comments)    (Feraheme) = "SYNCOPE; patient tolerated Venofer 10/01/18 s rxn"   . Oxybutynin Other (See Comments)    Hallucinations    . Vancomycin Other (See Comments)    ARF 05-2016 -- affects kidneys     Antimicrobials this admission:   Vancomycin 9/11>>   Meropenem 9/11 >>  Dose adjustments this admission:  n/a  Microbiology results:   9/8 blood x 1: ng x 1 day to date   9/8 COVID: negative   prior admissions:    * 8/16 wound abdomen: ESBL E coli    * 6/2 urine: > 100 K/ml VRE  Thank you for allowing pharmacy to be a part of this patient's care.  Arty Baumgartner,  Pager: [phone] or phone: [phone] 01/25/2019 12:39 PM

## 2019-01-25 NOTE — Progress Notes (Signed)
    CC: SBO  Subjective: Patient is tolerating a soft diet.  Ostomy is working there is stool in the bag. He remains anemic and is probably still a bit dry. Objective: Vital signs in last 24 hours: Temp:  [98.7 F (37.1 C)-100.3 F (37.9 C)] 98.7 F (37.1 C) (09/11 0600) Pulse Rate:  [95-108] 104 (09/11 0600) Resp:  [17-18] 17 (09/11 0600) BP: (86-118)/(55-82) 118/82 (09/11 0600) SpO2:  [96 %-100 %] 98 % (09/11 0600) Last BM Date: 01/25/19 1740 PO 1761 IV 1975 urine 600 ostomy T-max 100 BP up at 5 AM.  118/82 Potassium is 4.3, mag is 1.7 Intake/Output from previous day: 09/10 0701 - 09/11 0700 In: 3501.1 [P.O.:1740; I.V.:1761.1] Out: 2575 [Urine:1975; Stool:600] Intake/Output this shift: Total I/O In: 360 [P.O.:360] Out: -   General appearance: alert, cooperative and no distress GI: soft, non-tender; bowel sounds normal; no masses,  no organomegaly and Stool and gas coming from the ostomy.  Lab Results:  Recent Labs    01/23/19 0325 01/24/19 0414  WBC 13.0* 10.2  HGB 8.3* 7.4*  HCT 27.3* 24.6*  PLT 414* 453*    BMET Recent Labs    01/24/19 0414 01/25/19 0336  NA 143 141  K 3.7 4.3  CL 112* 110  CO2 25 25  GLUCOSE 127* 90  BUN 15 15  CREATININE <0.30* 0.33*  CALCIUM 7.5* 7.9*   PT/INR No results for input(s): LABPROT, INR in the last 72 hours.  Recent Labs  Lab 01/22/19 1322 01/23/19 0325  AST 13* 13*  ALT 12 12  ALKPHOS 83 73  BILITOT 0.3 0.6  PROT 5.5* 5.5*  ALBUMIN 1.6* 1.5*     Lipase     Component Value Date/Time   LIPASE 36 10/14/2018 0150     Medications: . collagenase   Topical Daily  . enoxaparin (LOVENOX) injection  40 mg Subcutaneous Q24H  . feeding supplement (PRO-STAT SUGAR FREE 64)  30 mL Oral BID  . multivitamin with minerals  1 tablet Oral Daily  . nutrition supplement (JUVEN)  1 packet Oral BID BM  . pantoprazole  20 mg Oral Daily     Assessment/Plan Acute on chronic hypoxic respiratory failure from  aspiration pneumonia Hx OSA Septic shock from pneumonia with volume depletion CKD Neurogenic bladder with chronic suprapubic catheter Quadriplegia Chronic sacral decubitus with osteomyelitis Hypokalemia Hypomagnesemia   Recurrent SBO  Exploratory laparotomy and resection of necrotic bowel 09/2018,[facility] SBO 10/15/18  FEN:  soft diet ID: None DVT: Lovenox Follow up:  TBD  Plan: Small bowel obstruction has resolved.  He is tolerating soft diet, and ostomy is functioning well.  No need for surgical intervention.  Please call if we can be of further service.      LOS: 3 days    Damont Balles 01/25/2019 [phone]

## 2019-01-25 NOTE — Progress Notes (Signed)
Dr. Aundra Dubin page re: 86/54, HR 105. Pt remains asymptomatic. MD aware and orders entered.

## 2019-01-25 NOTE — Progress Notes (Signed)
  Subjective:  Patient seen at bedside this morning.  Patient thinks the distention is improving.  Patient says that overnight when he had a fever he felt chills and felt a little nauseous.  Denies current nausea.   Objective:    Vital Signs (last 24 hours): Vitals:   01/24/19 2208 01/25/19 0013 01/25/19 0200 01/25/19 0600  BP: (!) 86/55 (!) 96/57 108/80 118/82  Pulse: (!) 108 (!) 108 98 (!) 104  Resp: 18 18 17 17   Temp: 100 F (37.8 C) 99.3 F (37.4 C) 98.9 F (37.2 C) 98.7 F (37.1 C)  TempSrc: Oral Oral Oral Oral  SpO2: 99% 100% 100% 98%  Weight:      Height:        Physical Exam: General Alert and answers questions appropriately, no acute distress  Cardiac Regular rate and rhythm, no murmurs, rubs, or gallops  Pulmonary Clear to auscultation bilaterally without wheezes, rhonchi, or rales  Skin Purulent drainage from right flank ulcer.  Abdominal Soft, without significant distention    Assessment/Plan:   Principal Problem:   SBO (small bowel obstruction) ([name] Active Problems:   Quadriplegia ([name]   S/P colostomy ([place])   Severe protein-calorie malnutrition ([name]   Open abdominal wall wound  Patient is a 53 year old male with past medical history of quadriplegia and recurrent SBO's.  He presented with symptoms consistent with SBO and CT findings confirmed SBO.  # SBO: Abdominal x-ray yesterday shows improving partial distal small bowel obstruction. Patient tolerated soft diet yesterday and advanced to regular diet today. Surgery assesses that SBO has resolved. * Potassium 4.1, mag 1.7, repleted mag. * Phenergan as needed for nausea, patient without pain due to spinal cord injury  # Wound infection: Patient febrile to 100.3 overnight, WBC of 12.4 this morning. Right flank ulcer with purulent drainage. Recent admission for wound abscess with ESBL and GPC s/p treatment with vanc+meropenem. Patient with asymptomatic low blood pressure with systolics in the [id number].  Patient did have low BP on prior admission with systolics in 70 and [id number] ([id number]) but these readings improved with treatment of infection. * Will treat empirically with vanc+meropenem * NS at 100 ml/hr  Diet: Regular diet DVT Ppx: Lovenox Dispo: Anticipated discharge pending clinical improvement  Jeanmarie Hubert, MD 01/25/2019, 7:43 AM Pager: [phone]

## 2019-01-26 ENCOUNTER — Inpatient Hospital Stay (HOSPITAL_COMMUNITY): Payer: Medicare Other

## 2019-01-26 LAB — BASIC METABOLIC PANEL
Anion gap: 4 — ABNORMAL LOW (ref 5–15)
BUN: 14 mg/dL (ref 6–20)
CO2: 26 mmol/L (ref 22–32)
Calcium: 7.7 mg/dL — ABNORMAL LOW (ref 8.9–10.3)
Chloride: 110 mmol/L (ref 98–111)
Creatinine, Ser: 0.34 mg/dL — ABNORMAL LOW (ref 0.61–1.24)
GFR calc Af Amer: >60 mL/min (ref >60)
GFR calc non Af Amer: >60 mL/min (ref >60)
Glucose, Bld: 89 mg/dL (ref 70–99)
Potassium: 3.9 mmol/L (ref 3.5–5.1)
Sodium: 140 mmol/L (ref 135–145)

## 2019-01-26 LAB — CBC
HCT: 27.2 % — ABNORMAL LOW (ref 39.0–52.0)
Hemoglobin: 8 g/dL — ABNORMAL LOW (ref 13.0–17.0)
MCH: 25 pg — ABNORMAL LOW (ref 26.0–34.0)
MCHC: 29.4 g/dL — ABNORMAL LOW (ref 30.0–36.0)
MCV: 85 fL (ref 80.0–100.0)
Platelets: 436 10*3/uL — ABNORMAL HIGH (ref 150–400)
RBC: 3.2 MIL/uL — ABNORMAL LOW (ref 4.22–5.81)
RDW: 15.4 % (ref 11.5–15.5)
WBC: 12.3 10*3/uL — ABNORMAL HIGH (ref 4.0–10.5)
nRBC: 0 % (ref 0.0–0.2)

## 2019-01-26 LAB — ACTH STIMULATION, 3 TIME POINTS
Cortisol, 30 Min: 34.8 ug/dL
Cortisol, 60 Min: 39.7 ug/dL
Cortisol, Base: 23.1 ug/dL

## 2019-01-26 MED ORDER — COSYNTROPIN NICU IV SYRINGE 0.25 MG/ML (STANDARD DOSE): 0.2500 mg | Freq: Once | INTRAVENOUS | Status: DC

## 2019-01-26 MED ORDER — SIMETHICONE 80 MG PO CHEW
80.0000 mg | CHEWABLE_TABLET | Freq: Four times a day (QID) | ORAL | Status: DC | PRN
  Administered 2019-01-26 – 2019-01-27 (×3): 80 mg via ORAL
  Filled 2019-01-26 (×4): qty 1

## 2019-01-26 MED ORDER — COSYNTROPIN 0.25 MG IJ SOLR
0.2500 mg | Freq: Once | INTRAMUSCULAR | Status: AC
  Administered 2019-01-26: 09:00:00 0.25 mg via INTRAVENOUS
  Filled 2019-01-26: qty 0.25

## 2019-01-26 MED ORDER — COSYNTROPIN 0.25 MG IJ SOLR: 0.2500 mg | Freq: Once | INTRAMUSCULAR | Status: DC

## 2019-01-26 NOTE — Progress Notes (Signed)
   Subjective: The patient stated that he has again lost his appetite, he is feeling somewhat bloated but there is not nausea currently. He stated that his SBO's typically begin this way. He denied pain.  Objective:  Vital signs in last 24 hours: Vitals:   01/25/19 2339 01/26/19 0123 01/26/19 0520 01/26/19 0521  BP: 120/78 130/85 102/63   Pulse: (!) 103 99 (!) 105 (!) 102  Resp: 18 18 18    Temp: 99.3 F (37.4 C) 98.6 F (37 C) 98.4 F (36.9 C)   TempSrc: Oral Oral Oral   SpO2: 98% 100% 98%   Weight:      Height:       General: A/O x4, in no acute distress, afebrile, nondiaphoretic Cardio: RRR, no mrg's  Pulmonary: CTA bilaterally, no wheezing or crackles  Abdomen: Bowel sounds normal, mildly distended MSK: BLE nontender, nonedematous Skin: RLQ abd wound appears clean and well dressed. Psych: Appropriate affect, not depressed in appearance, engages well  Assessment/Plan:  Principal Problem:   SBO (small bowel obstruction) ([name] Active Problems:   Quadriplegia ([place])   S/P colostomy ([place])   Severe protein-calorie malnutrition ([name]   Open abdominal wall wound   Patient is a 53 year old male with past medical significant for quadriplegia and recurrent SBO's.  He presented with symptoms consistent with SBO considering findings to confirm such.  Stay was complicated and that he was noted to have fever of 100.3, with an elevated leukocytosis in conjunction with his right flank ulcer oozing purulent drainage.  Given his history of ESBL/MRSA wound infection on a prior recent admission status/post treatment with Vanco and meropenem for the same, he was restarted on Vanco and meropenem.  A/P: Wound infection: Given the patient's fever, persistent leukocytosis greater than 12,000 and chronic hypertension in the presence of obviously infected purulent draining wound we will treat with antibiotics for 10-day course this time. -Continue meropenem IV to complete a 10-day course ending on  02/03/2019 -Continue IV vancomycin to complete 10-day course ending on 02/03/2019 -Continue wound care   SBO: Resolved initially. Today he again has symptoms. I am concerned that he may have advanced his diet too quickly. A repeat KUB demonstrated persistent distention consistent with a partial SBO.  Will transition back to a clear diet today.  Electrolytes stable today potassium 3.9. Complaining of gas this am as well. I am less concerned for a complete SBO.  --Mag and BMP in the am --Will need to place NGT again if his symptoms continue to worsen  Code: Full Diet: Regular DVT prophylaxis: Lovenox Dispo: Anticipated discharge in approximately 3-5 day(s).   Kathi Ludwig, MD 01/26/2019, 8:42 AM Pager: # [phone]

## 2019-01-26 NOTE — Progress Notes (Signed)
  Date: 01/26/2019  Patient name: [name] [name]  Medical record number: [id number]  Date of birth: [birth year]   This patient's plan of care was discussed with the house staff. Please see Dr. Nelma Rothman note for complete details. I concur with his findings.   Sid Falcon, MD 01/26/2019, 12:44 PM

## 2019-01-27 ENCOUNTER — Inpatient Hospital Stay (HOSPITAL_COMMUNITY): Payer: Medicare Other

## 2019-01-27 LAB — BASIC METABOLIC PANEL
Anion gap: 6 (ref 5–15)
BUN: 8 mg/dL (ref 6–20)
CO2: 24 mmol/L (ref 22–32)
Calcium: 7.3 mg/dL — ABNORMAL LOW (ref 8.9–10.3)
Chloride: 107 mmol/L (ref 98–111)
Creatinine, Ser: <0.3 mg/dL — ABNORMAL LOW (ref 0.61–1.24)
Glucose, Bld: 82 mg/dL (ref 70–99)
Potassium: 3.7 mmol/L (ref 3.5–5.1)
Sodium: 137 mmol/L (ref 135–145)

## 2019-01-27 LAB — CBC
HCT: 24.8 % — ABNORMAL LOW (ref 39.0–52.0)
Hemoglobin: 7.5 g/dL — ABNORMAL LOW (ref 13.0–17.0)
MCH: 25.5 pg — ABNORMAL LOW (ref 26.0–34.0)
MCHC: 30.2 g/dL (ref 30.0–36.0)
MCV: 84.4 fL (ref 80.0–100.0)
Platelets: 362 10*3/uL (ref 150–400)
RBC: 2.94 MIL/uL — ABNORMAL LOW (ref 4.22–5.81)
RDW: 15.3 % (ref 11.5–15.5)
WBC: 7.6 10*3/uL (ref 4.0–10.5)
nRBC: 0 % (ref 0.0–0.2)

## 2019-01-27 LAB — CULTURE, BLOOD (ROUTINE X 2)
Culture: NO GROWTH
Special Requests: ADEQUATE

## 2019-01-27 LAB — MAGNESIUM: Magnesium: 1.4 mg/dL — ABNORMAL LOW (ref 1.7–2.4)

## 2019-01-27 MED ORDER — MAGNESIUM SULFATE 4 GM/100ML IV SOLN
4.0000 g | Freq: Once | INTRAVENOUS | Status: DC
  Filled 2019-01-27: qty 100

## 2019-01-27 MED ORDER — MAGNESIUM SULFATE 4 GM/100ML IV SOLN
4.0000 g | Freq: Once | INTRAVENOUS | Status: AC
  Administered 2019-01-27: 4 g via INTRAVENOUS
  Filled 2019-01-27: qty 100

## 2019-01-27 NOTE — Progress Notes (Signed)
  Date: 01/27/2019  Patient name: [name] [name]  Medical record number: [id number]  Date of birth: 1966/01/04        I have seen and evaluated this patient and I have discussed the plan of care with the house staff. Please see Dr. Nelma Rothman note for complete details. I concur with his findings and plan.    Sid Falcon, MD 01/27/2019, 7:26 PM

## 2019-01-27 NOTE — Progress Notes (Addendum)
   Subjective: Patient states that his nausea, anorexia decreased ostomy output have resolved.  Patient stated that he feels back to near his baseline and is agreeable to advancing his diet to softs.  He no longer feels febrile or has generalized malaise.  Objective:  Vital signs in last 24 hours: Vitals:   01/26/19 1241 01/26/19 2003 01/26/19 2250 01/27/19 0419  BP: 104/75 94/70 (!) 85/67 91/67  Pulse: 96 98 95 97  Resp: 18 17 18 18   Temp: 98.7 F (37.1 C) 98.8 F (37.1 C) 98.8 F (37.1 C) 98.5 F (36.9 C)  TempSrc: Oral Oral Oral Oral  SpO2: 98% 99% 100% 98%  Weight:      Height:       General: A/O x4, in no acute distress, afebrile, nondiaphoretic Abdomen: Bowel sounds normal, soft, nontender, ostomy output increased MSK: BLE nontender, nonedematous Psych: Appropriate affect, not depressed in appearance, engages well  Assessment/Plan:  Principal Problem:   SBO (small bowel obstruction) ([name] Active Problems:   Quadriplegia ([name]   S/P colostomy ([place])   Severe protein-calorie malnutrition ([place])   Open abdominal wall wound   Patient is a 53 year old male with past medical significant for quadriplegia and recurrent SBO's.  He presented with symptoms consistent with SBO considering findings to confirm such.  Stay was complicated and that he was noted to have fever of 100.3, with an elevated leukocytosis in conjunction with his right flank ulcer oozing purulent drainage.  Given his history of ESBL/MRSA wound infection on a prior recent admission status/post treatment with Vanco and meropenem for the same, he was restarted on Vanco and meropenem.  A/P: Wound infection: Stable.  Wound appears to have some granulation tissue forming no foul-smelling purulent discharge observed.  He will need to continue meropenem and Vanco to complete a 14-day course given his history of ESBL and MRSA.  CBC with improved leukocytosis to 7.6 from 12.3 the prior day.  Hemoglobin stable 7.5 range.  -Continue meropenem and vancomycin, final day 02/03/2019  SBO: Symptoms of nausea, vomiting, anorexia and distention resolved today.  Will advance to soft diet.  Will consider regular diet again tomorrow and if stable plan for discharge back to facility on Monday or Tuesday.  BMP stable serum creatinine potassium.  Magnesium 1.4, will replete. -Replacing magnesium with 4g IV  Code: Full Diet: Soft DVT prophylaxis: Lovenox Dispo: Anticipated discharge in approximately 1-2 day(s).   Kathi Ludwig, MD 01/27/2019, 6:29 AM Pager: # [phone]

## 2019-01-28 DIAGNOSIS — B9620 Unspecified Escherichia coli [E. coli] as the cause of diseases classified elsewhere: Secondary | ICD-10-CM

## 2019-01-28 DIAGNOSIS — B9562 Methicillin resistant Staphylococcus aureus infection as the cause of diseases classified elsewhere: Secondary | ICD-10-CM

## 2019-01-28 LAB — CBC
HCT: 25.6 % — ABNORMAL LOW (ref 39.0–52.0)
Hemoglobin: 7.5 g/dL — ABNORMAL LOW (ref 13.0–17.0)
MCH: 25.5 pg — ABNORMAL LOW (ref 26.0–34.0)
MCHC: 29.3 g/dL — ABNORMAL LOW (ref 30.0–36.0)
MCV: 87.1 fL (ref 80.0–100.0)
Platelets: 409 10*3/uL — ABNORMAL HIGH (ref 150–400)
RBC: 2.94 MIL/uL — ABNORMAL LOW (ref 4.22–5.81)
RDW: 15.3 % (ref 11.5–15.5)
WBC: 11 10*3/uL — ABNORMAL HIGH (ref 4.0–10.5)
nRBC: 0 % (ref 0.0–0.2)

## 2019-01-28 LAB — BASIC METABOLIC PANEL
Anion gap: 6 (ref 5–15)
BUN: 9 mg/dL (ref 6–20)
CO2: 25 mmol/L (ref 22–32)
Calcium: 7.2 mg/dL — ABNORMAL LOW (ref 8.9–10.3)
Chloride: 106 mmol/L (ref 98–111)
Creatinine, Ser: <0.3 mg/dL — ABNORMAL LOW (ref 0.61–1.24)
Glucose, Bld: 68 mg/dL — ABNORMAL LOW (ref 70–99)
Potassium: 3.4 mmol/L — ABNORMAL LOW (ref 3.5–5.1)
Sodium: 137 mmol/L (ref 135–145)

## 2019-01-28 LAB — MAGNESIUM: Magnesium: 1.9 mg/dL (ref 1.7–2.4)

## 2019-01-28 LAB — CULTURE, BLOOD (ROUTINE X 2): Culture: NO GROWTH

## 2019-01-28 LAB — VANCOMYCIN, TROUGH: Vancomycin Tr: 7 ug/mL — ABNORMAL LOW (ref 15–20)

## 2019-01-28 MED ORDER — VANCOMYCIN HCL 10 G IV SOLR
1250.0000 mg | INTRAVENOUS | Status: DC
  Administered 2019-01-29 – 2019-02-01 (×4): 1250 mg via INTRAVENOUS
  Filled 2019-01-28 (×4): qty 1250

## 2019-01-28 MED ORDER — SIMETHICONE 80 MG PO CHEW
80.0000 mg | CHEWABLE_TABLET | Freq: Four times a day (QID) | ORAL | Status: DC
  Administered 2019-01-28 – 2019-02-01 (×18): 80 mg via ORAL
  Filled 2019-01-28 (×18): qty 1

## 2019-01-28 NOTE — Progress Notes (Signed)
Pharmacy Antibiotic Note  [name] [name] is a 53 y.o. male admitted on 01/22/2019 with SBO, which has resolved. BP down and HR up. Pharmacy has been consulted for Meropenem and Vancomycin dosing for wound coverage. Hx open abdominal wall wound; prior ESBL E coli 12/30/18, treated with Meropenem as inpatient then Invanz as outpatient for total 7 days.   Quadriplegic, so creatinine not expected to correspond to drug clearance. Prior AKI due to Vancomycin (2018).   Vancomycin trough low at 7 this PM (drawn 2 hours late)  Plan: Meropenem 1gm IV q8hrs. Increase Vancomycin to 1250 mg iv Q 24 hours  Conservative Vancomycin dosing d/t hx AKI.  Follow renal function, culture data, clinical progress and antibiotic plans  Target Vanc troughs 10-15 mcg/ml.  Height: 6' (182.9 cm) Weight: 197 lb 5 oz (89.5 kg)(from 12/30/18) IBW/kg (Calculated) : 77.6  Temp (24hrs), Avg:98.8 F (37.1 C), Min:98.2 F (36.8 C), Max:99.3 F (37.4 C)  Recent Labs  Lab 01/22/19 1315  01/24/19 0414 01/25/19 0336 01/25/19 0942 01/26/19 0430 01/27/19 0914 01/28/19 0348 01/28/19 1800  WBC  --    < > 10.2  --  12.4* 12.3* 7.6 11.0*  --   CREATININE  --    < > <0.30* 0.33*  --  0.34* <0.30* <0.30*  --   LATICACIDVEN 0.7  --   --   --   --   --   --   --   --   VANCOTROUGH  --   --   --   --   --   --   --   --  7*   < > = values in this interval not displayed.    CrCl cannot be calculated (This lab value cannot be used to calculate CrCl because it is not a number: <0.30).    Allergies  Allergen Reactions  . Ferumoxytol Anaphylaxis and Other (See Comments)    (Feraheme) = "SYNCOPE; patient tolerated Venofer 10/01/18 s rxn"   . Oxybutynin Other (See Comments)    Hallucinations    . Vancomycin Other (See Comments)    ARF 05-2016 -- affects kidneys     Thank you Anette Guarneri, PharmD Please utilize Amion for appropriate phone number to reach the unit pharmacist ([place])   01/28/2019 7:10 PM

## 2019-01-28 NOTE — Care Management Important Message (Signed)
Important Message  Patient Details  Name: [name] [name] MRN: [id number] Date of Birth: 07-08-1965   Medicare Important Message Given:  Yes     Memory Argue 01/28/2019, 1:18 PM   UNABLE TO ENTER PATIENT ROOM DUE TO CONTACT PREACUTION

## 2019-01-28 NOTE — Progress Notes (Signed)
  Subjective:  Patient reports improvement in distention today. Patient reports some continued gas pain. Resting comfortably in bed.   Objective:    Vital Signs (last 24 hours): Vitals:   01/27/19 1340 01/27/19 2110 01/27/19 2113 01/28/19 0453  BP: (!) 85/40 (!) 76/55 (!) 84/53 (!) 86/63  Pulse: 99 100  99  Resp: 14 16  16   Temp: 98.2 F (36.8 C) 99.3 F (37.4 C)  98.2 F (36.8 C)  TempSrc: Oral Oral  Oral  SpO2: 100% 99%  100%  Weight:      Height:        Physical Exam: General Alert and answers questions appropriately, no acute distress  Abdominal Soft, non-tender, without distention. Bowel sounds present  Skin Multiple wounds - wound on right side with granulation tissue, without purulent drainage. Wound on back visualized without significant drainage.      Assessment/Plan:   Principal Problem:   SBO (small bowel obstruction) ([name] Active Problems:   Quadriplegia ([name]   S/P colostomy ([place])   Severe protein-calorie malnutrition ([name]   Open abdominal wall wound  Patient is a 53 year old male past medical history significant for quadriplegia and recurrent SBO's.  He presented with symptoms consistent with SBO and imaging verify the diagnosis.  His hospitalization was complicated as he was noted to have a fever of 100.3 (on 9/10) with an elevated leukocytosis (13.0) in conjunction with his right flank ulcers with purulent drainage.  He has history of ESBL/MRSA wound infection on a prior recent admission status post treatment with Vanco meropenem for the same, he was restarted on Vanco meropenem.  # Wound infection: Stable, wound with granulation tissue and no foul-smelling purulent discharge observed.  Tmax 99.3, WBC of 11.0 (down from 12.3). Blood cultures from 9/8 NGTD. Continue patient on meropenem and vancomycin to complete 14-day course of IV antibiotics. CT abd/pelvis from 01/22/19 reviewed showing no evidence for abscess. * OPAT order placed  # SBO: Patient with  soft, nondistended abdomen.  Reports improvement in distention. Will advance to regular diet.  Diet: Soft DVT Ppx: Lovenox Dispo: Anticipated discharge in approximately 1-2 day(s).   Jeanmarie Hubert, MD 01/28/2019, 12:45 PM Pager: [phone]

## 2019-01-28 NOTE — TOC Progression Note (Signed)
Transition of Care [facility]) - Progression Note    Patient Details  Name: WICK MOSELY MRN: [id number] Date of Birth: Oct 08, 1965  [facility]) CM/SW Contact  Bartholomew Crews, RN Phone Number: [phone] 01/28/2019, 5:05 PM  Clinical Narrative:    Received CM consult for IV antibiotics. Already active with [facility] and [facility] - notified liaison about additional 2 weeks needed. CM following for transition of care needs.    Expected Discharge Plan: [place] Barriers to Discharge: Continued Medical Work up  Expected Discharge Plan and Services Expected Discharge Plan: [place]   Discharge Planning Services: CM Consult Post Acute Care Choice: [place] arrangements for the past 2 months: Single Family Home                 DME Arranged: N/A         HH Arranged: RN           Social Determinants of Health (SDOH) Interventions    Readmission Risk Interventions Readmission Risk Prevention Plan 01/04/2019 10/15/2018  Transportation Screening Complete Complete  PCP or Specialist Appt within 5-7 Days Complete -  PCP or Specialist Appt within 3-5 Days - Not Complete  Not Complete comments - not yet ready for d/c  Home Care Screening Complete -  Medication Review (RN CM) Complete -  HRI or [place] - Complete  Social Work Consult for [place] Planning/Counseling - Complete  Palliative Care Screening - Not Applicable  Medication Review Press photographer) - Complete  Some recent data might be hidden

## 2019-01-28 NOTE — Progress Notes (Signed)
Paged by RN for increased distention. Examined patient, patient with increased distention and reports increased gas discomfort. Last vitals with BP 104/78, HR 102, afebrile at 98.9. Likely represents gas distention. Will de-escalate diet from regular to soft diet and start simethicone 80 mg Q6HRs.  Jeanmarie Hubert, MD 01/28/2019, 4:53 PM Pager: [phone]

## 2019-01-28 NOTE — Progress Notes (Signed)
Pharmacy Antibiotic Note  [name] [name] is a 53 y.o. male admitted on 01/22/2019 with SBO, which has resolved. BP down and HR up. Pharmacy has been consulted for Meropenem and Vancomycin dosing for wound coverage. Hx open abdominal wall wound; prior ESBL E coli 12/30/18, treated with Meropenem as inpatient then Invanz as outpatient for total 7 days.   Quadriplegic, so creatinine not expected to correspond to drug clearance. Prior AKI due to Vancomycin (2018).   Plan:  Meropenem 1gm IV q8hrs.  Vancomycin 1500 mg IV x 1, then 1 gm IV q24hrs.  Vancomycin trough today prior to dose  Conservative Vancomycin dosing d/t hx AKI.  Follow renal function, culture data, clinical progress and antibiotic plans  Target Vanc troughs 10-15 mcg/ml.  Height: 6' (182.9 cm) Weight: 197 lb 5 oz (89.5 kg)(from 12/30/18) IBW/kg (Calculated) : 77.6  Temp (24hrs), Avg:98.6 F (37 C), Min:98.2 F (36.8 C), Max:99.3 F (37.4 C)  Recent Labs  Lab 01/22/19 1315  01/24/19 0414 01/25/19 0336 01/25/19 0942 01/26/19 0430 01/27/19 0914 01/28/19 0348  WBC  --    < > 10.2  --  12.4* 12.3* 7.6 11.0*  CREATININE  --    < > <0.30* 0.33*  --  0.34* <0.30* <0.30*  LATICACIDVEN 0.7  --   --   --   --   --   --   --    < > = values in this interval not displayed.    CrCl cannot be calculated (This lab value cannot be used to calculate CrCl because it is not a number: <0.30).    Allergies  Allergen Reactions  . Ferumoxytol Anaphylaxis and Other (See Comments)    (Feraheme) = "SYNCOPE; patient tolerated Venofer 10/01/18 s rxn"   . Oxybutynin Other (See Comments)    Hallucinations    . Vancomycin Other (See Comments)    ARF 05-2016 -- affects kidneys     Antimicrobials this admission:   Vancomycin 9/11>>   Meropenem 9/11 >>  Dose adjustments this admission:  n/a  Microbiology results:   9/8 blood x 1: ng x 1 day to date   9/8 COVID: negative   prior admissions:    * 8/16 wound abdomen: ESBL E coli    * 6/2  urine: > 100 K/ml VRE  Lenton Gendreau A. Levada Dy, PharmD, BCPS, FNKF Clinical Pharmacist [place] Please utilize Amion for appropriate phone number to reach the unit pharmacist ([place])   01/28/2019 9:18 AM

## 2019-01-28 NOTE — Progress Notes (Addendum)
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 To discharging provider:  please sign these orders via discharge navigator,  Select New Orders & click on the button choice - Manage This Unsigned Work.     Thank you Anette Guarneri, PharmD Please utilize Amion for appropriate phone number to reach the unit pharmacist ([place])

## 2019-01-29 DIAGNOSIS — L089 Local infection of the skin and subcutaneous tissue, unspecified: Secondary | ICD-10-CM

## 2019-01-29 LAB — BASIC METABOLIC PANEL
Anion gap: 7 (ref 5–15)
BUN: 11 mg/dL (ref 6–20)
CO2: 25 mmol/L (ref 22–32)
Calcium: 7.4 mg/dL — ABNORMAL LOW (ref 8.9–10.3)
Chloride: 106 mmol/L (ref 98–111)
Creatinine, Ser: <0.3 mg/dL — ABNORMAL LOW (ref 0.61–1.24)
Glucose, Bld: 96 mg/dL (ref 70–99)
Potassium: 3.2 mmol/L — ABNORMAL LOW (ref 3.5–5.1)
Sodium: 138 mmol/L (ref 135–145)

## 2019-01-29 LAB — CBC
HCT: 25.9 % — ABNORMAL LOW (ref 39.0–52.0)
Hemoglobin: 7.8 g/dL — ABNORMAL LOW (ref 13.0–17.0)
MCH: 25.5 pg — ABNORMAL LOW (ref 26.0–34.0)
MCHC: 30.1 g/dL (ref 30.0–36.0)
MCV: 84.6 fL (ref 80.0–100.0)
Platelets: 415 10*3/uL — ABNORMAL HIGH (ref 150–400)
RBC: 3.06 MIL/uL — ABNORMAL LOW (ref 4.22–5.81)
RDW: 15.2 % (ref 11.5–15.5)
WBC: 13.1 10*3/uL — ABNORMAL HIGH (ref 4.0–10.5)
nRBC: 0 % (ref 0.0–0.2)

## 2019-01-29 LAB — MAGNESIUM: Magnesium: 1.6 mg/dL — ABNORMAL LOW (ref 1.7–2.4)

## 2019-01-29 MED ORDER — METOCLOPRAMIDE HCL 10 MG PO TABS
10.0000 mg | ORAL_TABLET | Freq: Three times a day (TID) | ORAL | Status: DC
  Administered 2019-01-29 – 2019-02-01 (×10): 10 mg via ORAL
  Filled 2019-01-29 (×10): qty 1

## 2019-01-29 MED ORDER — MAGNESIUM SULFATE 4 GM/100ML IV SOLN
4.0000 g | Freq: Once | INTRAVENOUS | Status: AC
  Administered 2019-01-29: 14:00:00 4 g via INTRAVENOUS
  Filled 2019-01-29: qty 100

## 2019-01-29 MED ORDER — POTASSIUM CHLORIDE CRYS ER 20 MEQ PO TBCR
40.0000 meq | EXTENDED_RELEASE_TABLET | ORAL | Status: AC
  Administered 2019-01-29 (×2): 40 meq via ORAL
  Filled 2019-01-29 (×2): qty 2

## 2019-01-29 MED ORDER — MAGNESIUM SULFATE IN D5W 1-5 GM/100ML-% IV SOLN
1.0000 g | Freq: Once | INTRAVENOUS | Status: AC
  Administered 2019-01-29: 1 g via INTRAVENOUS
  Filled 2019-01-29: qty 100

## 2019-01-29 NOTE — Progress Notes (Signed)
Nutrition Follow-up  DOCUMENTATION CODES:   Not applicable  INTERVENTION:   -Continue 30 ml Prostat BID, each supplement provides 100 kcals and 15 grams protein -Continue MVI with minerals daily -Continue 1 packet Juven BID, each packet provides 95 calories, 2.5 grams of protein (collagen), and 9.8 grams of carbohydrate (3 grams sugar); also contains 7 grams of L-arginine and L-glutamine, 300 mg vitamin C, 15 mg vitamin E, 1.2 mcg vitamin B-12, 9.5 mg zinc, 200 mg calcium, and 1.5 g  Calcium Beta-hydroxy-Beta-methylbutyrate to support wound healing  NUTRITION DIAGNOSIS:   Increased nutrient needs related to wound healing as evidenced by estimated needs.  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  The patient stated that "I have a small bowel obstruction."  The patient stated that in addition to the nausea and abdominal distention he experienced dry heaves and decreased stoma output.  His last bowel movement was this morning.  He denies fever, chills, headache, vision changes, myalgias or confusion.  Patient denies changes in his dietary habits or activity.  9/8- NGT inserted 9/9- NGT d/c, advanced to clear liquids 9/10- advanced to soft diet  Reviewed I/O's: +310 ml x 24 hours and -914 ml since admission  UOP: 900 ml x 24  hours  Pt with increased abdominal distention. Diet downgraded to soft diet. Intake improving; PO: 25-100%. Pt is taking supplements.   Labs reviewed.   Diet Order:   Diet Order            DIET SOFT Room service appropriate? Yes; Fluid consistency: Thin  Diet effective now              EDUCATION NEEDS:   Not appropriate for education at this time  Skin:  Skin Integrity Issues:: Stage IV, Unstageable, Stage III, Other (Comment) Stage II: - Stage III: lt hip Stage IV: rt thoracic area, sacrum, bilateral ischial tuberoisty, perineal area, scrotum Unstageable: rt heel with eschar Other: chronic non-healing rt flank wound  Last BM:  01/27/19 (via colostomy)  Height:   Ht Readings from Last 1 Encounters:  01/23/19 6' (1.829 m)    Weight:   Wt Readings from Last 1 Encounters:  01/23/19 89.5 kg    Ideal Body Weight:  70.8 kg(adjusted for quadriplegia)  BMI:  Body mass index is 26.76 kg/m.  Estimated Nutritional Needs:   Kcal:  1900-2100  Protein:  105-120 grams  Fluid:  > 1.9 L    Corley Kohls A. Jimmye Norman, RD, LDN, [place] Registered Dietitian II Certified Diabetes Care and Education Specialist Pager: [phone] After hours Pager: [phone]

## 2019-01-29 NOTE — Progress Notes (Signed)
  Subjective:  Patient reports that he continues to feel distended.  Denies fever, chills, lightheadedness, chest pain.   Objective:    Vital Signs (last 24 hours): Vitals:   01/28/19 2058 01/29/19 0517 01/29/19 0753 01/29/19 1000  BP: 91/73 91/69 (!) 89/62 123/90  Pulse: (!) 111 (!) 102 (!) 102 97  Resp: 16 16 16 15   Temp: 99.5 F (37.5 C) 99 F (37.2 C) 98.4 F (36.9 C) 98.6 F (37 C)  TempSrc: Oral Oral Oral Oral  SpO2: 100% 100% 100% 100%  Weight:      Height:         Physical Exam: General Alert and answers questions appropriately, no acute distress  Abdominal Non-tender, moderately distended, ostomy with minimal drainage  HEENT EOM in tact, no drainage    Assessment/Plan:   Principal Problem:   SBO (small bowel obstruction) ([name] Active Problems:   Quadriplegia ([name]   S/P colostomy ([name]   Severe protein-calorie malnutrition ([name]   Open abdominal wall wound  Patient is a 53 year old male with past medical history significant for quadriplegia and recurrent SBO's who presented with SBO.  Patient has experienced worsening gaseous distention when diet is advanced.  Hospitalization also concentrated by increased purulent drainage from right flank ulcer for which patient was started on Vanco/meropenem due to history of ESBL/MRSA wound infection on prior admission.  # SBO: Patient abdomen with continued distention, softer than yesterday afternoon.  Will continue on soft diet. Symptoms may be secondary to gastroparesis. Chart review shows a history of gastroparesis with earliest mention in H&P by Dr. Michail Sermon on 10/09/2015. At that time, patient was on reglan and based on notes it appears he was continued on this medication at home. When asked this AM, patient was unaware of this medication. * Will start reglan 10 mg PO TID with meals.  # Wound infection: T-max 99.5, WBC 13.1 (up from 11.0 yesterday).  Continuing patient on Vanco/meropenem -plan is for 14-day course.  Per  care management, patient already active with [facility] and [facility] to provide home IV antibiotics.  Diet: Soft DVT Ppx: Lovenox Dispo: Anticipated discharge pending clinical improvement.   Jeanmarie Hubert, MD 01/29/2019, 12:54 PM Pager: [phone]

## 2019-01-30 DIAGNOSIS — I959 Hypotension, unspecified: Secondary | ICD-10-CM

## 2019-01-30 DIAGNOSIS — Z8614 Personal history of Methicillin resistant Staphylococcus aureus infection: Secondary | ICD-10-CM

## 2019-01-30 DIAGNOSIS — K56600 Partial intestinal obstruction, unspecified as to cause: Principal | ICD-10-CM

## 2019-01-30 LAB — BASIC METABOLIC PANEL
Anion gap: 4 — ABNORMAL LOW (ref 5–15)
BUN: 16 mg/dL (ref 6–20)
CO2: 27 mmol/L (ref 22–32)
Calcium: 7.4 mg/dL — ABNORMAL LOW (ref 8.9–10.3)
Chloride: 106 mmol/L (ref 98–111)
Creatinine, Ser: <0.3 mg/dL — ABNORMAL LOW (ref 0.61–1.24)
Glucose, Bld: 82 mg/dL (ref 70–99)
Potassium: 4 mmol/L (ref 3.5–5.1)
Sodium: 137 mmol/L (ref 135–145)

## 2019-01-30 LAB — CBC
HCT: 25.1 % — ABNORMAL LOW (ref 39.0–52.0)
Hemoglobin: 7.6 g/dL — ABNORMAL LOW (ref 13.0–17.0)
MCH: 25.7 pg — ABNORMAL LOW (ref 26.0–34.0)
MCHC: 30.3 g/dL (ref 30.0–36.0)
MCV: 84.8 fL (ref 80.0–100.0)
Platelets: 469 10*3/uL — ABNORMAL HIGH (ref 150–400)
RBC: 2.96 MIL/uL — ABNORMAL LOW (ref 4.22–5.81)
RDW: 15.4 % (ref 11.5–15.5)
WBC: 11.5 10*3/uL — ABNORMAL HIGH (ref 4.0–10.5)
nRBC: 0 % (ref 0.0–0.2)

## 2019-01-30 LAB — MAGNESIUM: Magnesium: 2.1 mg/dL (ref 1.7–2.4)

## 2019-01-30 NOTE — Care Management Important Message (Signed)
Important Message  Patient Details  Name: [name] [name] MRN: [id number] Date of Birth: 1965-08-30   Medicare Important Message Given:  Yes     Memory Argue 01/30/2019, 2:32 PM

## 2019-01-30 NOTE — Progress Notes (Signed)
  Subjective:  Patient reports that he is doing okay, still some distention of his abdomen.  Reports that his ostomy output past few days has been less than it normally is.  All questions answered, patient updated on plan of care.   Objective:    Vital Signs (last 24 hours): Vitals:   01/29/19 1353 01/29/19 1758 01/29/19 2140 01/30/19 0550  BP: 94/68 106/76 91/63 102/73  Pulse: (!) 104 (!) 104 98 (!) 111  Resp: 20 20    Temp: 98.8 F (37.1 C) 98.6 F (37 C) 98.8 F (37.1 C) 98.8 F (37.1 C)  TempSrc: Oral Oral Oral Oral  SpO2: 100% 98% 100% 100%  Weight:      Height:        Physical Exam: General Alert and answers questions appropriately, no acute distress  Cardiac Regular rate and rhythm, no murmurs, rubs, or gallops  Abdominal Soft, non-tender, mild distention, bowel sounds present  Skin Right lateral flank wound examined. Mild purulence on bandage, wound with granulation tissue    Assessment/Plan:   Principal Problem:   SBO (small bowel obstruction) ([name] Active Problems:   Quadriplegia ([name]   S/P colostomy ([place])   Severe protein-calorie malnutrition ([name]   Open abdominal wall wound  Patient is a 53 year old male with past medical history significant for quadriplegia and recurrent SBO's who presented with SBO.  Patient has experienced worsening gaseous distention when diet is advanced.  Hospitalization has also been complicated by increased purulent drainage from right flank ulcer and given recent history of ESBL/MRSA wound infection patient was started on vanc/meropenem.  # SBO: Patient abdomen less distended than yesterday.  Patient has had decreased ostomy output over the last few days.  Gastroparesis may be contributing to patient's distention, and patient started on Reglan yesterday.  * Will continue on soft diet * Reglan 10 mg PO TID with meals * Simethicone 80 mg 4 times daily + every 6 hour simethicone as needed  # Hypotension: Patient with chronic history  of asymptomatic hypotension.  Patient's hypotension has improved with minimum systolic of 91 over the past 24 hours.  Patient remains tachycardic with heart rate ranging 98-111.  We will continue to monitor.  # Wound infection: T-max of 98.8, WBC of 11.5 down from 13.1 yesterday.  On day 6 of vancomycin + meropenem, plan for 14-day course.  Diet: Soft diet DVT Ppx: Lovenox 40 mg daily Dispo: Anticipated discharge pending clinical improvement  Jeanmarie Hubert, MD 01/30/2019, 11:05 AM Pager: [phone]

## 2019-01-31 LAB — CBC
HCT: 23.7 % — ABNORMAL LOW (ref 39.0–52.0)
Hemoglobin: 7.3 g/dL — ABNORMAL LOW (ref 13.0–17.0)
MCH: 25.6 pg — ABNORMAL LOW (ref 26.0–34.0)
MCHC: 30.8 g/dL (ref 30.0–36.0)
MCV: 83.2 fL (ref 80.0–100.0)
Platelets: 466 10*3/uL — ABNORMAL HIGH (ref 150–400)
RBC: 2.85 MIL/uL — ABNORMAL LOW (ref 4.22–5.81)
RDW: 15.5 % (ref 11.5–15.5)
WBC: 9.5 10*3/uL (ref 4.0–10.5)
nRBC: 0 % (ref 0.0–0.2)

## 2019-01-31 LAB — BASIC METABOLIC PANEL
Anion gap: 5 (ref 5–15)
BUN: 17 mg/dL (ref 6–20)
CO2: 27 mmol/L (ref 22–32)
Calcium: 7.4 mg/dL — ABNORMAL LOW (ref 8.9–10.3)
Chloride: 105 mmol/L (ref 98–111)
Creatinine, Ser: <0.3 mg/dL — ABNORMAL LOW (ref 0.61–1.24)
Glucose, Bld: 88 mg/dL (ref 70–99)
Potassium: 3.7 mmol/L (ref 3.5–5.1)
Sodium: 137 mmol/L (ref 135–145)

## 2019-01-31 LAB — MAGNESIUM: Magnesium: 1.6 mg/dL — ABNORMAL LOW (ref 1.7–2.4)

## 2019-01-31 MED ORDER — POTASSIUM CHLORIDE CRYS ER 20 MEQ PO TBCR
40.0000 meq | EXTENDED_RELEASE_TABLET | Freq: Once | ORAL | Status: AC
  Administered 2019-01-31: 40 meq via ORAL
  Filled 2019-01-31: qty 2

## 2019-01-31 MED ORDER — ACETAMINOPHEN 325 MG PO TABS
650.0000 mg | ORAL_TABLET | Freq: Four times a day (QID) | ORAL | Status: AC | PRN
  Administered 2019-01-31: 23:00:00 650 mg via ORAL
  Filled 2019-01-31: qty 2

## 2019-01-31 MED ORDER — MAGNESIUM SULFATE 4 GM/100ML IV SOLN
4.0000 g | Freq: Once | INTRAVENOUS | Status: AC
  Administered 2019-01-31: 08:00:00 4 g via INTRAVENOUS
  Filled 2019-01-31: qty 100

## 2019-01-31 NOTE — Plan of Care (Signed)

## 2019-01-31 NOTE — Progress Notes (Signed)
  Subjective:  Patient reports he is doing okay today.  Denies fever, chills, shortness of breath.  Patient feels like his stomach is better but still has some gas distention.  Patient informed of plan of care, all questions answered.   Objective:    Vital Signs (last 24 hours): Vitals:   01/30/19 1415 01/30/19 2117 01/31/19 0514 01/31/19 1313  BP: 99/68 90/60 108/71 109/68  Pulse: (!) 108 (!) 111 100 (!) 110  Resp:  18 18 18   Temp: 99.1 F (37.3 C) 99.5 F (37.5 C) 98.6 F (37 C) 99 F (37.2 C)  TempSrc: Oral Oral Oral Oral  SpO2: 98% 99% 99% 98%  Weight:      Height:        Physical Exam: General Alert and answers questions appropriately, no acute distress  Cardiac Regular rate and rhythm, no murmurs, rubs, or gallops  Abdominal Soft, mild gaseous distention, bowel sounds present    Assessment/Plan:   Principal Problem:   SBO (small bowel obstruction) ([name] Active Problems:   Quadriplegia ([name]   S/P colostomy ([name]   Severe protein-calorie malnutrition ([name]   Open abdominal wall wound  Patient is a 53 year old male with past medical history significant for quadriplegia recurrent SBO who presented with SBO.    # SBO: Patient abdomen softer compared with yesterday.  Patient also had increased ostomy output yesterday.  Thought that gastroparesis may be contributing to patient's abdominal distention, and patient was started on Reglan 2 days ago. * We will advance to regular diet from soft today. * Reglan 10 mg p.o. 3 times daily with meals * Simethicone 80 mg 4 times daily + every 6 hours simethicone as needed  # Hypotension: Patient with chronic history of asymptomatic hypertension.  Patient's hypotension improved with with lowest systolic of 90 over past 24 hours.  Patient remains tachycardic.  We will continue to monitor.  # Wound infection: T-max 99.5, WBC 9.5, downtrending.  On day 7 of vancomycin plus meropenem, plan for 14-day course.  Diet: Regular diet DVT  Ppx: Lovenox 40 mg daily Dispo: Anticipated discharge in 1 to 2 days  Jeanmarie Hubert, MD 01/31/2019, 2:49 PM Pager: [phone]

## 2019-01-31 NOTE — Progress Notes (Signed)
Dressings changed as ordered. Pt tolerated well. Will continue to monitor.

## 2019-01-31 NOTE — Progress Notes (Signed)
Pharmacy Antibiotic Note  [name] [name] is a 53 y.o. male admitted on 01/22/2019 with SBO, which has resolved. BP down and HR up. Pharmacy has been consulted for Meropenem and Vancomycin dosing for wound coverage. Hx open abdominal wall wound; prior ESBL E coli 12/30/18, treated with Meropenem as inpatient then Invanz as outpatient for total 7 days.   Quadriplegic, so creatinine not expected to correspond to drug clearance. Prior AKI due to Vancomycin (2018).   WBC 9.5, Scr <0.3. Not at steady state with new dose yet. Abx through 9/23 per OPAT orders.   Plan: Continue Meropenem 1gm IV q8hrs. Continue Vancomycin to 1250 mg iv Q 24 hours  Conservative Vancomycin dosing d/t hx AKI.  Follow renal function, culture data, clinical progress and antibiotic plans  Target Vanc troughs 10-15 mcg/ml.  Height: 6' (182.9 cm) Weight: 197 lb 5 oz (89.5 kg)(from 12/30/18) IBW/kg (Calculated) : 77.6  Temp (24hrs), Avg:99.1 F (37.3 C), Min:98.6 F (37 C), Max:99.5 F (37.5 C)  Recent Labs  Lab 01/27/19 0914 01/28/19 0348 01/28/19 1800 01/29/19 0344 01/30/19 0500 01/31/19 0433  WBC 7.6 11.0*  --  13.1* 11.5* 9.5  CREATININE <0.30* <0.30*  --  <0.30* <0.30* <0.30*  VANCOTROUGH  --   --  7*  --   --   --     CrCl cannot be calculated (This lab value cannot be used to calculate CrCl because it is not a number: <0.30).    Allergies  Allergen Reactions  . Ferumoxytol Anaphylaxis and Other (See Comments)    (Feraheme) = "SYNCOPE; patient tolerated Venofer 10/01/18 s rxn"   . Oxybutynin Other (See Comments)    Hallucinations    . Vancomycin Other (See Comments)    ARF 05-2016 -- affects kidneys     Akeylah Hendel A. Levada Dy, PharmD, BCPS, FNKF Clinical Pharmacist [place] Please utilize Amion for appropriate phone number to reach the unit pharmacist ([place])    01/31/2019 9:59 AM

## 2019-02-01 DIAGNOSIS — Z888 Allergy status to other drugs, medicaments and biological substances status: Secondary | ICD-10-CM

## 2019-02-01 DIAGNOSIS — Z881 Allergy status to other antibiotic agents status: Secondary | ICD-10-CM

## 2019-02-01 LAB — BASIC METABOLIC PANEL
Anion gap: 3 — ABNORMAL LOW (ref 5–15)
BUN: 15 mg/dL (ref 6–20)
CO2: 26 mmol/L (ref 22–32)
Calcium: 7.2 mg/dL — ABNORMAL LOW (ref 8.9–10.3)
Chloride: 108 mmol/L (ref 98–111)
Creatinine, Ser: <0.3 mg/dL — ABNORMAL LOW (ref 0.61–1.24)
Glucose, Bld: 93 mg/dL (ref 70–99)
Potassium: 3.6 mmol/L (ref 3.5–5.1)
Sodium: 137 mmol/L (ref 135–145)

## 2019-02-01 LAB — CBC
HCT: 22.1 % — ABNORMAL LOW (ref 39.0–52.0)
Hemoglobin: 7 g/dL — ABNORMAL LOW (ref 13.0–17.0)
MCH: 26.4 pg (ref 26.0–34.0)
MCHC: 31.7 g/dL (ref 30.0–36.0)
MCV: 83.4 fL (ref 80.0–100.0)
Platelets: 418 10*3/uL — ABNORMAL HIGH (ref 150–400)
RBC: 2.65 MIL/uL — ABNORMAL LOW (ref 4.22–5.81)
RDW: 15.8 % — ABNORMAL HIGH (ref 11.5–15.5)
WBC: 9.9 10*3/uL (ref 4.0–10.5)
nRBC: 0 % (ref 0.0–0.2)

## 2019-02-01 LAB — MAGNESIUM: Magnesium: 1.8 mg/dL (ref 1.7–2.4)

## 2019-02-01 MED ORDER — POTASSIUM CHLORIDE CRYS ER 20 MEQ PO TBCR
40.0000 meq | EXTENDED_RELEASE_TABLET | Freq: Once | ORAL | Status: AC
  Administered 2019-02-01: 11:00:00 40 meq via ORAL
  Filled 2019-02-01: qty 2

## 2019-02-01 MED ORDER — HEPARIN SOD (PORK) LOCK FLUSH 100 UNIT/ML IV SOLN
500.0000 [IU] | INTRAVENOUS | Status: AC | PRN
  Administered 2019-02-01: 500 [IU]
  Filled 2019-02-01: qty 5

## 2019-02-01 MED ORDER — ERTAPENEM IV (FOR PTA / DISCHARGE USE ONLY): 1.0000 g | INTRAVENOUS | 0 refills | Status: AC

## 2019-02-01 MED ORDER — POTASSIUM CHLORIDE CRYS ER 20 MEQ PO TBCR: 40.0000 meq | EXTENDED_RELEASE_TABLET | Freq: Every day | ORAL | 1 refills | Status: DC

## 2019-02-01 MED ORDER — VANCOMYCIN IV (FOR PTA / DISCHARGE USE ONLY): 1250.0000 mg | INTRAVENOUS | 0 refills | Status: AC

## 2019-02-01 MED ORDER — MAGNESIUM SULFATE 2 GM/50ML IV SOLN
2.0000 g | Freq: Once | INTRAVENOUS | Status: AC
  Administered 2019-02-01: 2 g via INTRAVENOUS
  Filled 2019-02-01 (×2): qty 50

## 2019-02-01 MED ORDER — METOCLOPRAMIDE HCL 10 MG PO TABS: 10.0000 mg | ORAL_TABLET | Freq: Three times a day (TID) | ORAL | 1 refills | Status: DC

## 2019-02-01 MED ORDER — SIMETHICONE 80 MG PO CHEW: 80.0000 mg | CHEWABLE_TABLET | Freq: Four times a day (QID) | ORAL | 2 refills | Status: DC | PRN

## 2019-02-01 NOTE — TOC Progression Note (Signed)
Transition of Care [facility]) - Progression Note    Patient Details  Name: [name] [name] MRN: [id number] Date of Birth: [birth year]  [facility]) CM/SW Contact  Jacalyn Lefevre Edson Snowball, RN Phone Number: 02/01/2019, 10:36 AM  Clinical Narrative:     Patient for discharge today on IV antibiotics.   [name] with [facility] aware.   Mateo Flow with [facility] aware.   Will need HHRN order including wound care instructions and OPAT script.   Patient will need PTAR transport home. Patient from home with his sister . Sister will not be home until 1800 tonight. Confirmed face shet information with patient. Will arrange PTAR for 1830 this evening. Patient in agreement.  Bedside nurse and [facility] aware.    Expected Discharge Plan: [place] Barriers to Discharge: Continued Medical Work up  Expected Discharge Plan and Services Expected Discharge Plan: [place]   Discharge Planning Services: CM Consult Post Acute Care Choice: [place] arrangements for the past 2 months: Single Family Home                 DME Arranged: N/A         HH Arranged: RN           Social Determinants of Health (SDOH) Interventions    Readmission Risk Interventions Readmission Risk Prevention Plan 01/04/2019 10/15/2018  Transportation Screening Complete Complete  PCP or Specialist Appt within 5-7 Days Complete -  PCP or Specialist Appt within 3-5 Days - Not Complete  Not Complete comments - not yet ready for d/c  Home Care Screening Complete -  Medication Review (RN CM) Complete -  HRI or [place] - Complete  Social Work Consult for [place] Planning/Counseling - Complete  Palliative Care Screening - Not Applicable  Medication Review Press photographer) - Complete  Some recent data might be hidden

## 2019-02-01 NOTE — Discharge Instructions (Signed)
You were seen in the hospital for a small bowel obstruction and a wound infection. It is important that you continue taking your reglan to help prevent your stomach from becoming distended. We also want you to continue taking your antibiotics as an outpatient to treat your wound infection. Your last day of antibiotics will be on September 23rd. We have also started you on potassium supplementation. Please take all your medications as prescribed. We also want you to followup with your primary care provider in 1 week.

## 2019-02-01 NOTE — Discharge Summary (Addendum)
Name: [name] [name] MRN: [id number] DOB: 07-05-1965 53 y.o. PCP: Gerald Leitz  Date of Admission: 01/22/2019 12:12 PM Date of Discharge: 02/01/2019 Attending Physician: No att. providers found  Discharge Diagnosis: 1. Small bowel obstruction 2. Wound infection  Discharge Medications: Allergies as of 02/01/2019      Reactions   Ferumoxytol Anaphylaxis, Other (See Comments)   (Feraheme) = "SYNCOPE; patient tolerated Venofer 10/01/18 s rxn"   Oxybutynin Other (See Comments)   Hallucinations    Vancomycin Other (See Comments)   ARF 05-2016 -- affects kidneys      Medication List    TAKE these medications   albuterol 108 (90 Base) MCG/ACT inhaler Commonly known as: VENTOLIN HFA Inhale 2 puffs into the lungs every 6 (six) hours as needed for wheezing or shortness of breath.   baclofen 20 MG tablet Commonly known as: LIORESAL Take 20 mg by mouth 2 (two) times daily.   ertapenem  IVPB Commonly known as: INVANZ Inject 1 g into the vein daily for 6 days. 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 What changed: See the new instructions.   magnesium oxide 400 MG tablet Commonly known as: MAG-OX Take 400 mg by mouth daily with breakfast.   metoCLOPramide 10 MG tablet Commonly known as: REGLAN Take 1 tablet (10 mg total) by mouth 3 (three) times daily with meals.   multivitamin with minerals Tabs tablet Take 1 tablet by mouth every morning.   ondansetron 8 MG disintegrating tablet Commonly known as: Zofran ODT Take 1 tablet (8 mg total) by mouth every 8 (eight) hours as needed for nausea or vomiting. What changed: reasons to take this   pantoprazole 40 MG tablet Commonly known as: PROTONIX Take 40 mg  by mouth 2 (two) times daily.   potassium chloride SA 20 MEQ tablet Commonly known as: K-DUR Take 2 tablets (40 mEq total) by mouth daily.   PROBIOTIC PO Take 1 capsule by mouth daily.   Santyl ointment Generic drug: collagenase Apply 1 application topically See admin instructions. Apply daily as directed to affected area(s) of the right hip   simethicone 80 MG chewable tablet Commonly known as: Gas-X Chew 1 tablet (80 mg total) by mouth 4 (four) times daily as needed for flatulence.   sucralfate 1 g tablet Commonly known as: CARAFATE Take 1 tablet (1 g total) by mouth 4 (four) times daily.   Toviaz 8 MG Tb24 tablet Generic drug: fesoterodine Take 8 mg by mouth every morning.   vancomycin  IVPB Inject 1,250 mg into the vein daily for 6 days. 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 Labs - Thursday:  BMP and vancomycin trough Labs - Every other week:  ESR and CRP   vitamin C 500 MG tablet Commonly known as: ASCORBIC ACID Take 500 mg by mouth daily.   Zinc 50 MG Tabs Take 50 mg by mouth 2 (two) times daily.            Home Infusion Instuctions  (From admission, onward)         Start  Ordered   02/01/19 0000  Home infusion instructions [facility] May follow [place] Dosing Protocol; May administer Cathflo as needed to maintain patency of vascular access device.; Flushing of vascular access device: per [facility] Protocol: 0.9% NaCl pre/post medica...    Question Answer Comment  Instructions May follow [place] Dosing Protocol   Instructions May administer Cathflo as needed to maintain patency of vascular access device.   Instructions Flushing of vascular access device: per [facility] Protocol: 0.9% NaCl pre/post medication administration and prn patency; Heparin 100 u/ml, 90m for implanted ports and Heparin 10u/ml, 576mfor all  other central venous catheters.   Instructions May follow [facility] Anaphylaxis Protocol for First Dose Administration in the home: 0.9% NaCl at 25-50 ml/hr to maintain IV access for protocol meds. Epinephrine 0.3 ml IV/IM PRN and Benadryl 25-50 IV/IM PRN s/s of anaphylaxis.   Instructions [facility] Infusion Coordinator (RN) to assist per patient IV care needs in the home PRN.      02/01/19 1222          Disposition and follow-up:   [name]  At the hospital follow up visit please address:  1.  Please assess patient's abdomen for distention.  Please check electrolytes including potassium and magnesium, patient takes supplementation as outpatient.   2.  Labs / imaging needed at time of follow-up: BMP, Magnesium, CBC (to evaluate hemoglobin)  3.  Pending labs/ test needing follow-up: None  Follow-up Appointments: Follow-up Information    OsPark Meo, PA-C Follow up in 1 week(s).   Specialty: Physician Assistant Contact information: [phone]         Hospital Course by problem list: # Small bowel obstruction: Patient presented with bowel distention, nausea, and CT findings consistent with small bowel obstruction.  Patient made NPO and NG tube placed with intermittent suction.  Surgery consulted and assessed that surgical intervention was not indicated.  NG tube removed on 9/9 and patient's diet was gradually advanced.  Patient had difficulty advancing to regular diet and this was thought to be secondary to gastroparesis.  Patient had previously been on Reglan and this medication was restarted and patient had reduced distention, was able to tolerate regular diet.  Patient frequently had low magnesium and potassium which was repleted as necessary.  # Wound infection: Patient with low-grade fevers, minimally elevated white count, and right flank ulcer  with purulent drainage.  Patient also had systolic blood pressures in the high 70s, 80s. Patient's hypotension was never symptomatic. Per chart review, patient normally has SBPs in the 90s.  Patient also had a recent admission for wound abscess with ESBL and gram-positive cocci status post treatment with Vanco + meropenem.  Patient was restarted on vancomycin + meropenem and received approximately 8 days as an inpatient.  Discharged on outpatient antibiotics to complete another 6 days. Patient's blood pressure improved with antibiotic therapy.  Discharge Vitals:   BP 108/72 (BP Location: Left Arm)   Pulse (!) 107   Temp 99.1 F (37.3 C)   Resp 17   Ht 6' (1.829 m)   Wt 89.5 kg Comment: from 12/30/18  SpO2 98%   BMI 26.76 kg/m   Pertinent Labs, Studies, and Procedures:  CBC Latest Ref Rng & Units 02/01/2019 01/31/2019 01/30/2019  WBC 4.0 - 10.5 K/uL 9.9 9.5 11.5(H)  Hemoglobin 13.0 - 17.0 g/dL 7.0(L) 7.3(L) 7.6(L)  Hematocrit 39.0 - 52.0 % 22.1(L) 23.7(L) 25.1(L)  Platelets 150 - 400 K/uL 418(H) 466(H) 469(H)   BMP Latest Ref Rng & Units 02/01/2019 01/31/2019 01/30/2019  Glucose 70 - 99 mg/dL 93 88 82  BUN 6 - 20 mg/dL _0 Creatinine 0.61 - 1.24 mg/dL <0.30(L) <0.30(L) <0.30(L)  Sodium 135 - 145 mmol/L 137 137 137  Potassium 3.5 - 5.1 mmol/L 3.6 3.7 4.0  Chloride 98 - 111 mmol/L 108 105 106  CO2 22 - 32 mmol/L _1 Calcium 8.9 - 10.3 mg/dL 7.2(L) 7.4(L) 7.4(L)   Magnesium (02/01/2019): 1.8  Blood cultures x2 (9/8, 9/9): No growth at 5 days  Discharge Instructions: Discharge Instructions    Call MD for:  persistant nausea and vomiting   Complete by: As directed    Call MD for:  redness, tenderness, or signs of infection (pain, swelling, redness, odor or green/yellow discharge around incision site)   Complete by: As directed    Call MD for:  temperature >100.4   Complete by: As directed    Diet - low sodium heart healthy   Complete by: As directed    Home infusion  instructions [facility] May follow [place] Dosing Protocol; May administer Cathflo as needed to maintain patency of vascular access device.; Flushing of vascular access device: per [facility] Protocol: 0.9% NaCl pre/post medica...   Complete by: As directed    Instructions: May follow [place] Dosing Protocol   Instructions: May administer Cathflo as needed to maintain patency of vascular access device.   Instructions: Flushing of vascular access device: per [facility] Protocol: 0.9% NaCl pre/post medication administration and prn patency; Heparin 100 u/ml, 56m for implanted ports and Heparin 10u/ml, 563mfor all other central venous catheters.   Instructions: May follow [facility] Anaphylaxis Protocol for First Dose Administration in the home: 0.9% NaCl at 25-50 ml/hr to maintain IV access for protocol meds. Epinephrine 0.3 ml IV/IM PRN and Benadryl 25-50 IV/IM PRN s/s of anaphylaxis.   Instructions: [facility] Coord[facility] (RN) to assist per patient IV care needs in the home PRN.   Increase activity slowly   Complete by: As directed       Signed: MaJeanmarie HubertMD 02/02/2019, 1:06 PM   Pager: [phone]

## 2019-02-01 NOTE — Plan of Care (Signed)
Pt for discharge going home with home health, with right porta cath, given last dose of vanco, alert and oriented, changed wound extensive dressing, also changed the colostomy, applied dressing to suprapubic cath, with right arm splint, waiting for [facility] to pick him up, no complain of pain at this time, given health teachings, next appointment, informed the IV team to flush the porta cath, his sister aware about the discharge, given all his personal belongings, no s/s of distress noted.

## 2019-02-01 NOTE — Progress Notes (Signed)
  Subjective:  Patient reports that he feels okay today, denies shortness of breath.  Patient thinks his abdominal distention is improved.  Patient comfortable with going home today.   Objective:    Vital Signs (last 24 hours): Vitals:   02/01/19 0021 02/01/19 0217 02/01/19 0459 02/01/19 0955  BP: 112/75 109/75 130/85 120/81  Pulse: (!) 110 (!) 106 (!) 101 100  Resp: 16 17 19 18   Temp: 98.8 F (37.1 C) 99 F (37.2 C) 98.6 F (37 C) 98.4 F (36.9 C)  TempSrc: Oral Oral Oral Oral  SpO2: 100% 100% 100% 99%  Weight:      Height:        Physical Exam: General Alert and answers questions appropriately, no acute distress  Cardiac Regular rate and rhythm, no murmurs, rubs, or gallops  Abdominal Soft, , non-tender, without distention, bowel sounds present. Well formed stool present in ostomy bag    Assessment/Plan:   Principal Problem:   SBO (small bowel obstruction) ([name] Active Problems:   Quadriplegia ([name]   S/P colostomy ([place])   Severe protein-calorie malnutrition ([name]   Open abdominal wall wound  Patient is a 53 year old male with past medical history significant for quadriplegia and recurrent SBO's who presented with SBO.  Patient is improved and has been tolerating regular diet, without increased distention, with appropriate ostomy output.  # SBO: Patient abdomen soft, nondistended, tolerating regular diet.  Stool present in ostomy bag. * Regular diet * Reglan 10 mg p.o. 3 times daily with meals * Simethicone 80 mg 4 times daily + every 6 hours simethicone as needed  # Hypotension: Patient with chronic history of asymptomatic hypertension.  Patient's hypertension improved with low systolic of 89 over the past 24 hours.  # Wound infection: T-max 101.7, white count 9.9. Will discharge on vancomycin + meropenem to complete 14-day course.  Diet: Regular diet DVT Ppx: Lovenox 40 mg QD Dispo: Anticipated discharge today pending arrangement of home health, outpatient  antibiotics.  Jeanmarie Hubert, MD 02/01/2019, 10:00 AM Pager: [phone]

## 2019-02-01 NOTE — Progress Notes (Signed)
Patient leaving [facility] via [facility] transport and to be discharged to home.  VSS and denies any pain.  All documentation and patient's belongings are with [facility] staff.

## 2019-02-01 NOTE — Progress Notes (Signed)
Pt de-access the porta cath prior to discharge home.

## 2019-02-03 IMAGING — CT CT HEAD W/O CM
3 of 4 series · 14 of 47 positions shown, 16 images · non-contrast
Comparison: None.

CLINICAL DATA: Altered mental status, just discharged from the
hospital yesterday

EXAM:
CT HEAD WITHOUT CONTRAST
TECHNIQUE: Contiguous axial images were obtained from the base of the skull
through the vertex without intravenous contrast.

[Series 2: head w/o · axial · non-contrast · 0.45mm/px · z∈[-139,-14]mm · 8 of 31 slices shown, 10 images]
[im 3/31  brain]
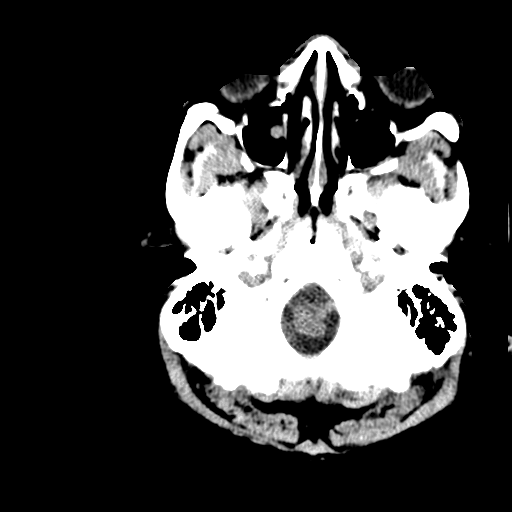
[im 3/31  bone]
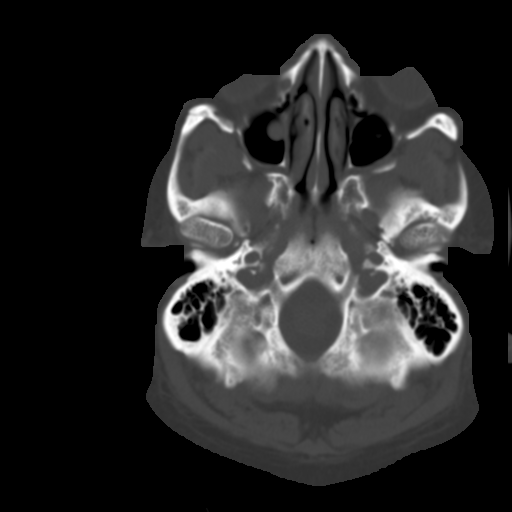
[im 7/31  brain]
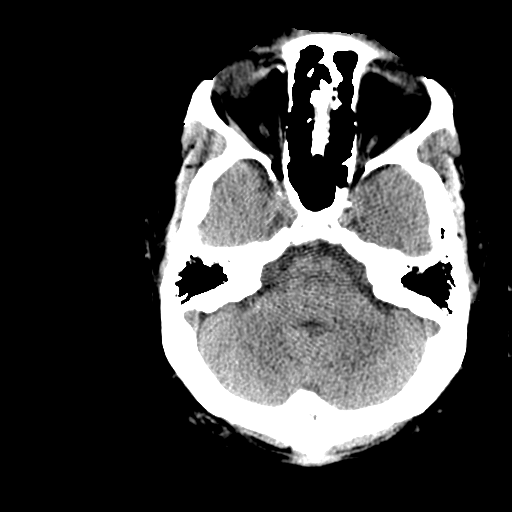
[im 11/31  brain]
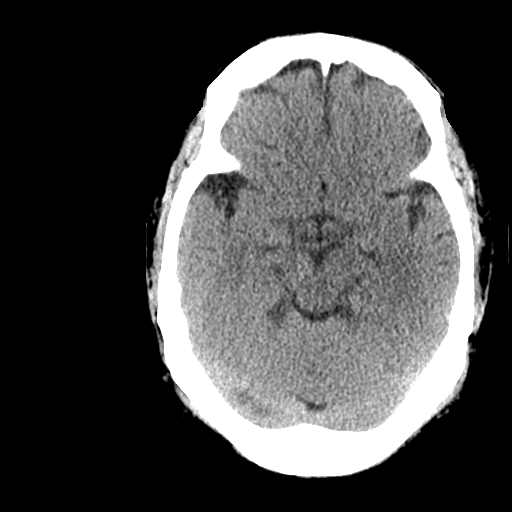
[im 13/31  brain]
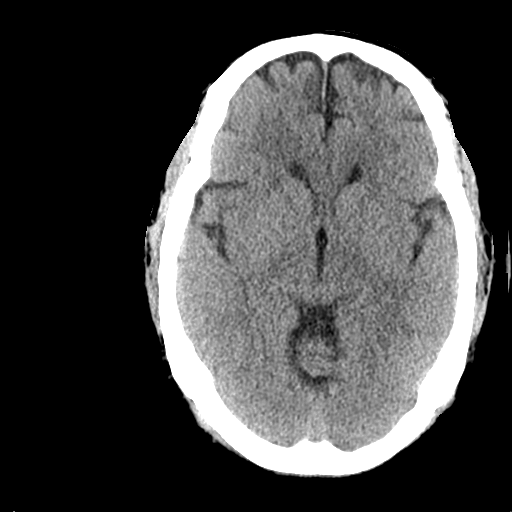
[im 18/31  brain]
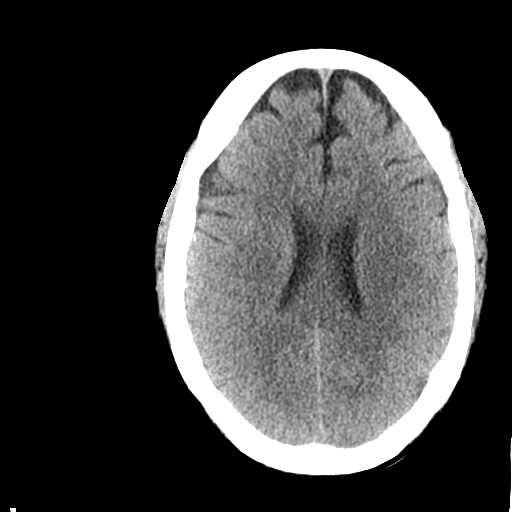
[im 18/31  bone]
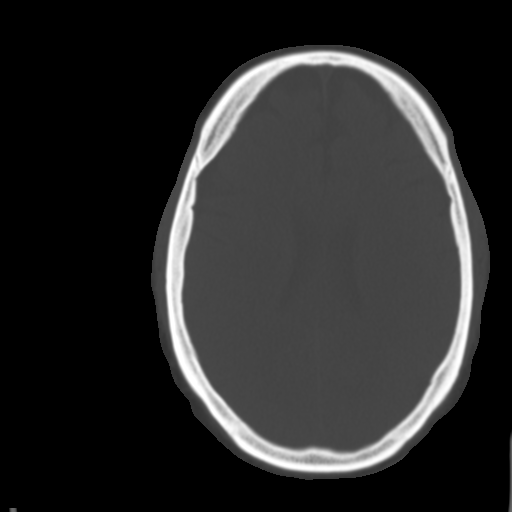
[im 20/31  brain]
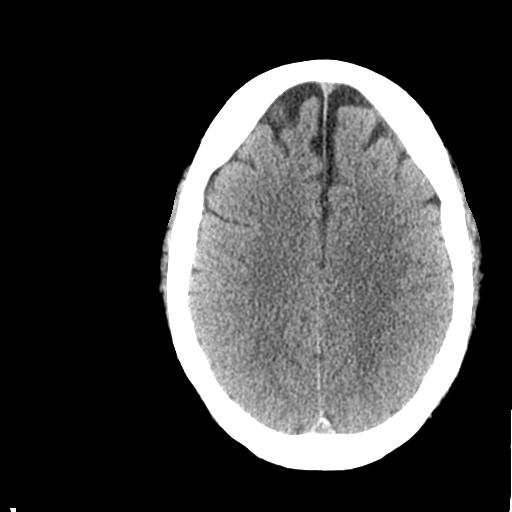
[im 24/31  brain]
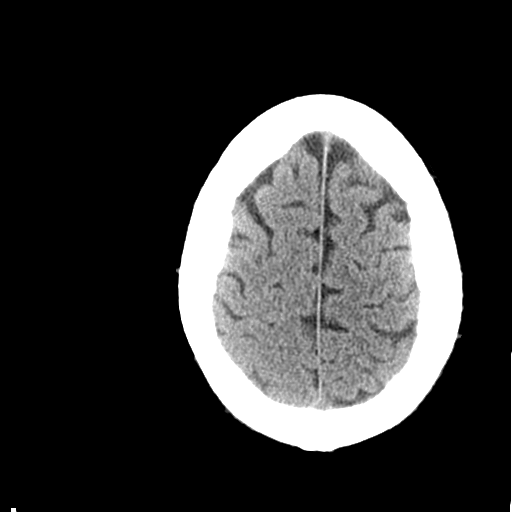
[im 28/31  brain]
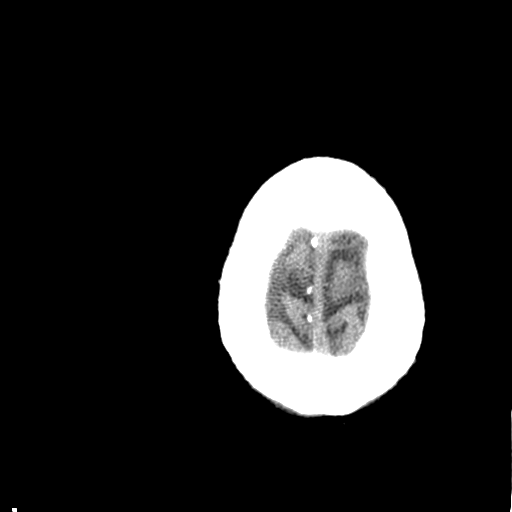

[Series 5: coronal · coronal · 0.30mm/px · 3 of 77 slices shown]
[im 26/77  brain]
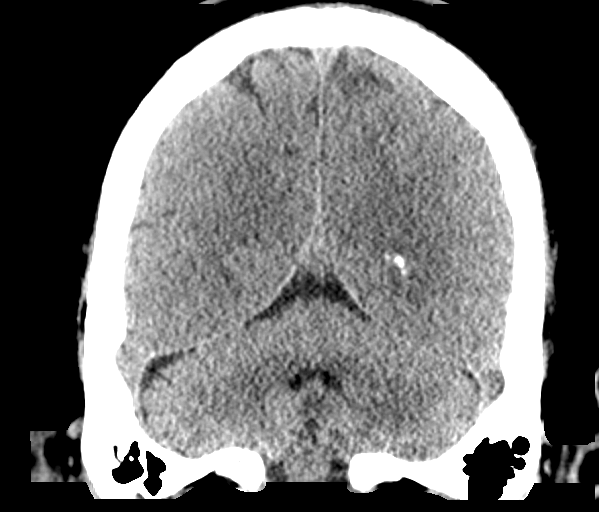
[im 34/77  brain]
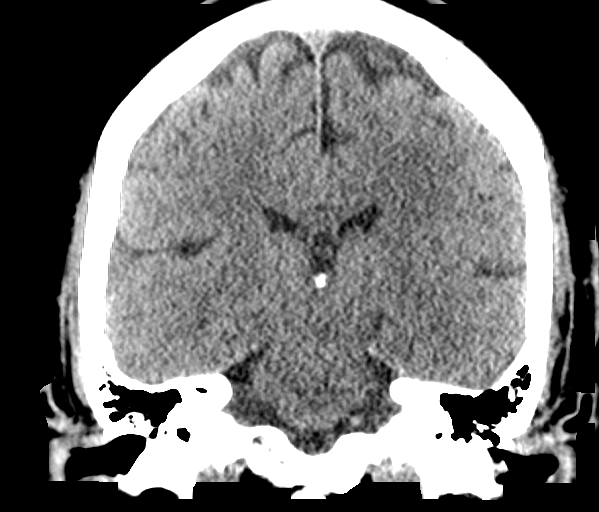
[im 43/77  brain]
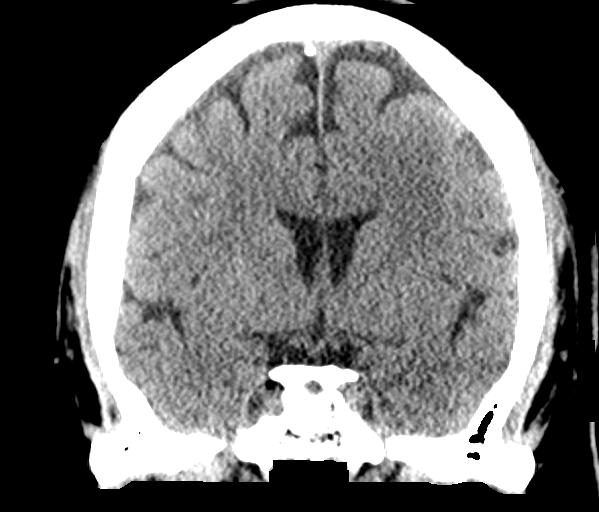

[Series 6: sagittal · sagittal · 0.28mm/px · 3 of 55 slices shown]
[im 19/55  brain]
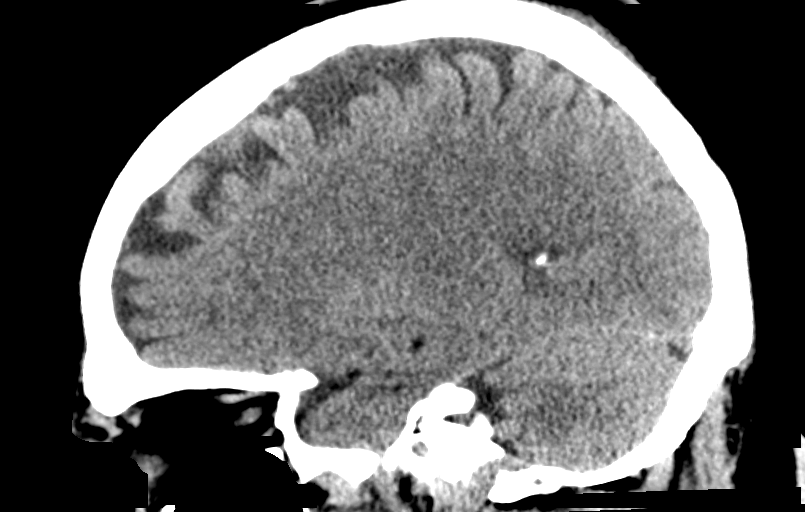
[im 28/55  brain]
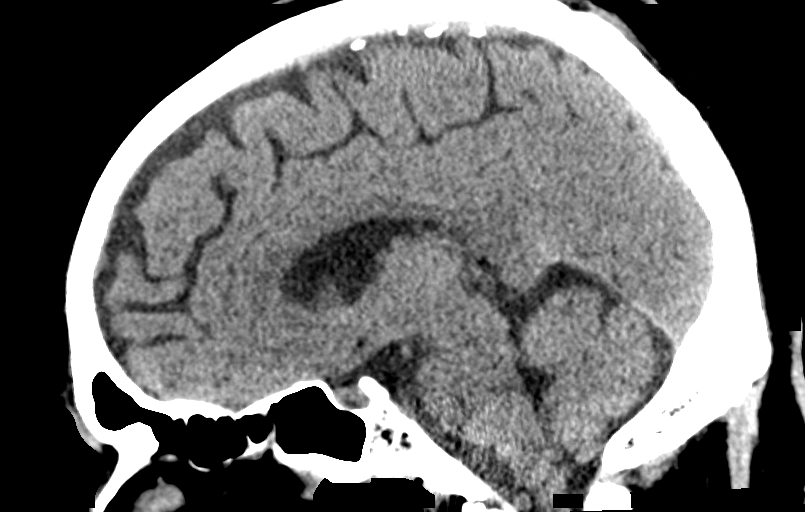
[im 37/55  brain]
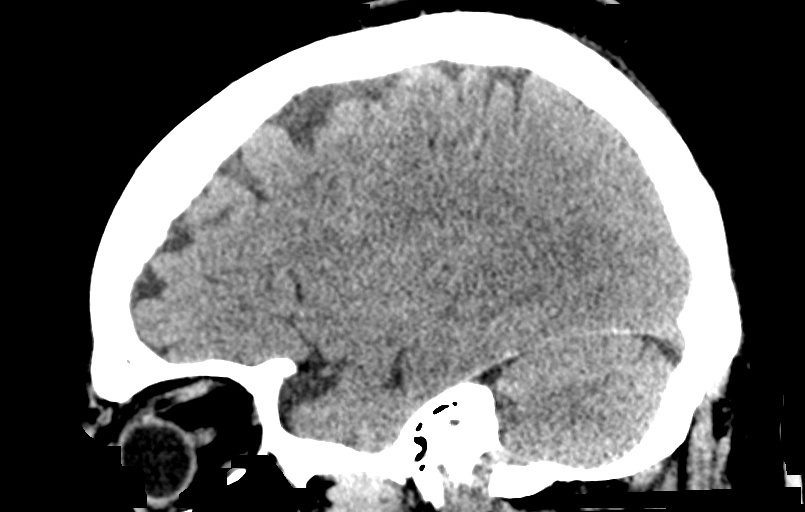

[14 of 47 positions shown; findings below may reference images not displayed]

FINDINGS: Brain: The ventricular system is normal in size and configuration,
and the septum is in a normal midline position. The fourth ventricle
and basilar cisterns are unremarkable. No hemorrhage, mass lesion,
or acute infarction is seen.

Vascular: No vascular abnormality is noted on this unenhanced study.

Skull: On bone window images, no calvarial abnormality is noted.

Sinuses/Orbits: There are retention cysts in the maxillary sinuses
and probably within the sphenoid sinus with opacification of a few
right ethmoid air cells.

Other: None.
IMPRESSION: 1. No acute intracranial abnormality.
2. Retention cysts in the maxillary sinuses and left sphenoid sinus.
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
## 2019-02-04 DIAGNOSIS — S31809A Unspecified open wound of unspecified buttock, initial encounter: Secondary | ICD-10-CM | POA: Diagnosis not present

## 2019-02-04 DIAGNOSIS — Z933 Colostomy status: Secondary | ICD-10-CM | POA: Diagnosis not present

## 2019-02-04 DIAGNOSIS — Z792 Long term (current) use of antibiotics: Secondary | ICD-10-CM | POA: Diagnosis not present

## 2019-02-04 DIAGNOSIS — S31102A Unspecified open wound of abdominal wall, epigastric region without penetration into peritoneal cavity, initial encounter: Secondary | ICD-10-CM | POA: Diagnosis not present

## 2019-02-04 DIAGNOSIS — L8944 Pressure ulcer of contiguous site of back, buttock and hip, stage 4: Secondary | ICD-10-CM | POA: Diagnosis not present

## 2019-02-08 DIAGNOSIS — S31809A Unspecified open wound of unspecified buttock, initial encounter: Secondary | ICD-10-CM | POA: Diagnosis not present

## 2019-02-08 DIAGNOSIS — Z933 Colostomy status: Secondary | ICD-10-CM | POA: Diagnosis not present

## 2019-02-08 DIAGNOSIS — S31102A Unspecified open wound of abdominal wall, epigastric region without penetration into peritoneal cavity, initial encounter: Secondary | ICD-10-CM | POA: Diagnosis not present

## 2019-02-08 DIAGNOSIS — L8944 Pressure ulcer of contiguous site of back, buttock and hip, stage 4: Secondary | ICD-10-CM | POA: Diagnosis not present

## 2019-02-11 DIAGNOSIS — S31102A Unspecified open wound of abdominal wall, epigastric region without penetration into peritoneal cavity, initial encounter: Secondary | ICD-10-CM | POA: Diagnosis not present

## 2019-02-11 DIAGNOSIS — Z933 Colostomy status: Secondary | ICD-10-CM | POA: Diagnosis not present

## 2019-02-11 DIAGNOSIS — S31809A Unspecified open wound of unspecified buttock, initial encounter: Secondary | ICD-10-CM | POA: Diagnosis not present

## 2019-02-11 DIAGNOSIS — L8944 Pressure ulcer of contiguous site of back, buttock and hip, stage 4: Secondary | ICD-10-CM | POA: Diagnosis not present

## 2019-02-14 ENCOUNTER — Encounter (HOSPITAL_BASED_OUTPATIENT_CLINIC_OR_DEPARTMENT_OTHER): Payer: Medicare Other | Attending: Internal Medicine | Admitting: Internal Medicine

## 2019-02-14 ENCOUNTER — Other Ambulatory Visit: Payer: Self-pay

## 2019-02-14 DIAGNOSIS — L89153 Pressure ulcer of sacral region, stage 3: Secondary | ICD-10-CM | POA: Insufficient documentation

## 2019-02-14 DIAGNOSIS — S31105A Unspecified open wound of abdominal wall, periumbilic region without penetration into peritoneal cavity, initial encounter: Secondary | ICD-10-CM | POA: Insufficient documentation

## 2019-02-14 DIAGNOSIS — L89100 Pressure ulcer of unspecified part of back, unstageable: Secondary | ICD-10-CM | POA: Diagnosis not present

## 2019-02-14 DIAGNOSIS — L89320 Pressure ulcer of left buttock, unstageable: Secondary | ICD-10-CM | POA: Insufficient documentation

## 2019-02-14 DIAGNOSIS — M8668 Other chronic osteomyelitis, other site: Secondary | ICD-10-CM | POA: Diagnosis not present

## 2019-02-14 DIAGNOSIS — M86352 Chronic multifocal osteomyelitis, left femur: Secondary | ICD-10-CM | POA: Diagnosis not present

## 2019-02-14 DIAGNOSIS — S51002A Unspecified open wound of left elbow, initial encounter: Secondary | ICD-10-CM | POA: Diagnosis not present

## 2019-02-14 DIAGNOSIS — L97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 breakdown of skin: Secondary | ICD-10-CM | POA: Insufficient documentation

## 2019-02-14 DIAGNOSIS — S71101A Unspecified open wound, right thigh, initial encounter: Secondary | ICD-10-CM | POA: Insufficient documentation

## 2019-02-14 DIAGNOSIS — L89314 Pressure ulcer of right buttock, stage 4: Secondary | ICD-10-CM | POA: Insufficient documentation

## 2019-02-14 DIAGNOSIS — L89890 Pressure ulcer of other site, unstageable: Secondary | ICD-10-CM | POA: Diagnosis not present

## 2019-02-14 DIAGNOSIS — L89104 Pressure ulcer of unspecified part of back, stage 4: Secondary | ICD-10-CM | POA: Insufficient documentation

## 2019-02-14 DIAGNOSIS — L89899 Pressure ulcer of other site, unspecified stage: Secondary | ICD-10-CM | POA: Insufficient documentation

## 2019-02-14 DIAGNOSIS — X58XXXA Exposure to other specified factors, initial encounter: Secondary | ICD-10-CM | POA: Diagnosis not present

## 2019-02-14 DIAGNOSIS — I10 Essential (primary) hypertension: Secondary | ICD-10-CM | POA: Insufficient documentation

## 2019-02-14 DIAGNOSIS — L89210 Pressure ulcer of right hip, unstageable: Secondary | ICD-10-CM | POA: Insufficient documentation

## 2019-02-14 DIAGNOSIS — L89220 Pressure ulcer of left hip, unstageable: Secondary | ICD-10-CM | POA: Insufficient documentation

## 2019-02-14 DIAGNOSIS — L89894 Pressure ulcer of other site, stage 4: Secondary | ICD-10-CM | POA: Diagnosis not present

## 2019-02-18 NOTE — Progress Notes (Signed)
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 had recently been in hospital on January 3 after being admitted for sepsis secondary to multidrug resistant Pseudomonas UTI. On this hospitalization he developed acute onset of refractory nausea and vomiting. There was no fever. He was found to have Providencia sepsis. He was treated with broad-spectrum IV antibiotics then IV Levaquin. Ultimately transitioned to oral doxycycline which she is to continue indefinitely. With regards to his chronic wounds on his sacrum and buttock's he is known to have chronic osteomyelitis. He was seen by surgery with the only recommendation possible that with the that he had bilateral hip disarticulations. I am not sure whether this surgeon didn't want to proceed with  this or the patient refused. He was seen by palliative care. He uses CPAP at bedtime for obstructive sleep apnea. He was discharged on Zosyn for 6 days and doxycycline 100 every 12 hours indefinitely 07/20/15; the patient was hospitalized since his last stay here. He was treated with vancomycin and Zosyn for presumed sepsis. He had recently been treated as noted above for Providencia sepsis. His white count came down from the mid 20,000 down to 10,000. He been seen by plastic surgery and deemed not to be a candidate for further surgery. He saw infectious disease who changed him to Cefixime . He is discharged on Suprax and Flagyl. He is been seen weekly by [facility] home [facility]. They are apparently coordinating getting his wound dressing supplies. They're using collagen which I can't imagine is really cost effective. When I saw him in early February he had a necrotic area on his right lateral chest against the ribs. 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 He is seen in this clinic on a palliative basis. He has an extensive wound encompassing much of his right gluteal from the right initial tuberosity extending into the left the. There is probing to the pelvis bones on the right side. He has an extensive wound over the left anterior chest which has an older. I also increasingly necrotic tissue anteriorly but no gross infection. Finally has wounds on the left medial and right lateral leg and some small wounds on his foot. He is being followed by [facility] home [facility] 09/03/15; the patient returns today after an extended stay at [facility]. He was felt to have sepsis secondary to osteomyelitis of the thoracic region. Discharged on cefepime 2 g every 8 hours and Flagyl 5 mg 3 times a day followed by ID [name]. The patient was taken to the OR I believe by Dr. [name] for debridement of the right posterior lateral chest wound with intraoperative cultures growing Serratia and Pseudomonas with antibiotics continuing through 09/30/15. The debridement was done in the OR and ACEL placed with a wound VAC over this. CT scan of the area suggested sclerosis in the right 10th rib which was new Recommended follow-up with [name] and Dr. [name] and as although I don't think Dr. Leafy Ro office would be able to accommodate this man secondary to his disability. The patient states everything is going well. He has home care for the antibiotics. Family takes care of his multiple needs. He also had a significant gastric ileus in the hospital requiring an NG tube. He was seen by GI, his I'll ileus resolved. He was also noted that he had had chronic pelvic osteomyelitis and had previously been seen by [name] at [facility]. He declined further surgery. He had hypokalemia and hypomagnesemia that was replenished. 10/01/15; the patient has done quite well. Still on antibiotics for another  week although per minute notes the last visit that should've finished. In any case he still has a wound VAC over the right lateral ribs. As suspected he doesn't have any follow-up possibilities with Dr. [name]. He has [facility] home [facility] coming out for wound care 10/15/15 no major changes patient states he feels well antibiotics were completed and PICC line removed he has [facility] home [facility] 10/29/15; the patient expresses concern about the antibiotics all being  stopped. He also tells [place] that [facility] has stopped coming to his house and his left him with a week's worth of supplies 11/12/15; The patient has a new wound over the right hip area which he says was a result of trauma when he was in the ER last week (taking off his pants). He did receive wound care supplies we ordered 2 weeks ago. He states he is stable READMISSION 02/12/16; the patient was hospitalized from 7/6 through 7/14. Presenting with worsening wounds and sepsis. He was discovered to have on CT scan of the abdomen and pelvis bilateral lower lobe pneumonia and he was actually treated for bilateral lower lobe pneumonia. He was noted also to have chronic osteomyelitis. This was discussed with infectious disease and he was not felt to require treatment for this. Saw [name] of plastic surgery who recommended hydrotherapy which was done in the hospital. Turning no surgery. The patient then spent 2 months at [facility]. Patient states that they healed out a wound on his right hip I can't really verify that. He came home with no orders for the area on his right posterior rib cage and he asked that we take care of that 03/21/16 at this point in time patient continues to experience ongoing pressure ulcers of the sacral area especially in the right ischial region where this continues to probe to bone. The remainder of the gluteal ulcers actually appeared to be fairly free of significant necrotic tissue at this point in  time which is good news. He does have some necrotic tissue over the right flank region at this point in the superior/outer portion 04/11/16; substantial right greater than left buttock wounds bilaterally which continue to probe to bone. No debridement was required in this area. He is using silver alginate as a palliative wound dressing and so far at least this seems to be stable. ooThe area on his right posterior rib cage is basically a figure H-shaped wound at this point the superior lobe covered in a tightly adherent nonviable tissue which was debrided with a curette. We are going to need to change back to [facility] and this wound area. 05/02/16; substantial right greater than left buttock wounds which probed to bone in the right superior pelvis. He also has wounds across his lower perineum just below his scrotum. In using silver alginate here on a palliative basis. He has underlying chronic osteomyelitis. The area that took some attention today is his right lateral rib cage. This wound does not look as good as I remember last time. A large amount of necrotic material superiorly. This is deteriorated. 06/10/16;; the patient is brought back to clinic today after an admission to hospital from 05/07/16 through 05/22/16. He was admitted with a 3 week history of low-grade fevers increasing drainage from his ulcers noted by the home health nurse. He was admitted for sepsis secondary to recurrent osteomyelitis. 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 Blood cultures grew coagulase-negative staph. Infectious disease recommended 6 weeks of IV vancomycin and Zosyn. Plastic surgery saw the patient and performed an extensive debridement of his right lateral posterior wound across his ribs. This is the wound that had deteriorated last time he was here. Apparently a bone culture of  this area grew gram-negative rods [Escherichia coli] however the discrepancy between blood culture and bone culture was noted by infectious disease and is  antibiotics were not changed. At the time of this dictation I have not reviewed the bone culture results on the right rib cage. [name] put ACEL and a VAC on the posterior right rib cage wound which is somewhat confusing given the suggestion of underlying osteomyelitis here. The patient has a suprapubic catheter and has recurrent UTIs as well. His chest x-ray showed an expansile lytic bone lesion with distruction involving the right posterior fifth rib presumably this is due to infection. Finally he is listed as having severe protein calorie malnutrition however at the moment I [facility] he has a PICC line in he tells me he is getting antibiotics at home. According to what I understand he is getting wet to dry dressings to all wound areas. 2 can still see the A cells sutured into the right posterior rib area I did not disturb this. Cultures of the right ribs showed a few Escherichia coli and a few Pseudomonas, Escherichia coli should've been sensitive to Zosyn, I do not have the Pseudomonas culture at the moment 07/14/16; the patient was hospitalized from 2/2 through 2/5 with vancomycin toxicity resulting in acute kidney injury. This apparently stabilized. He is now off antibiotics but he says he had 4 or 5 weeks. 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 He was reviewed by infectious disease who thought that he completed his course of antibiotics. He also had osteomyelitis on the right lateral rib area. In any case he is not currently on any antibiotics He comes with a vague history of that the wound VAC has been discontinued from the right lateral rib wounds which was the area that had the osteomyelitis. It looks like he had some drape injury around this but in general the wound here looks fairly stable. I'm  uncertain at the time of this dictation whether somebody expected me to reorder parts of the wound VAC or the actual machine or supplies. This was being handled by Dr. Merri Ray 07/29/16; he is off antibiotics PICC line is out. He has chronic sacral osteomyelitis. He also had osteomyelitis on his right lateral rib area posteriorly and it is this area that really does not look very good today. Dark necrotic material with dark discoloration of the surrounding skin. He is insensate in this area. The area on his left leg has healed right leg is still open and of course the large area over his bilateral buttock's is a chronic draining stage IV wound 08/12/16- patient is here for follow-up evaluation of multiple pressure ulcers. He was recently hospitalized from 3/18- 3/25 to [facility] for wound infection. During the course of this hospitalization:negative blood culture o2, no wound culture obtained, surgical intervention (debridement and application of Acell and vac per Dr. [name]). He was discharged on ceftriaxone and metronidazole with an anticipated stop date at 73/5. He is voicing no complaints, denies fever or chills since discharge. According to the patient he has no follow-up appointment scheduled with plastics as there is no hoyer lift in her office. 08/26/16; he continues to have a substantial wound over his bilateral buttocks, right posterior rib cage, new wounds on the right lower flank which I think are pressure ulcers from perhaps pants that are onto tight and he has a new wound on the right lateral knee. We are using silver alginate to most of this. Patient voices a preference for Dakin's wet-to-dry to the posterior right rib cage 09/09/16; patient has been in hospital since last time he was  here apparently with an upper GI bleed. This was managed conservatively and he stabilized with PPIs. If anything his substantial wound area over the bilateral buttocks is worse, there is hardly  any normal tissue here. He has a large wound on the right lower rib cage which had underlying osteomyelitis. Wounds on the bilateral lower legs including a reopening on the right leg 09/30/16; patient states he is generally felt well no fever no chills no further upper GI bleeding. Relevant issues for this visit and include ooThe area over his right posterior ribs has an odor with necrotic material ooNew wound over his plantar right foot over the metatarsal heads 10/17/16; patient reports no systemic symptoms. However the area over the right posterior ribs again has copious necrotic material in the upper 50% of the wound area. ooAreas over the left medial right lateral and right posterior plantar foot have all closed 10/24/16; culture I did last week of this area was surprisingly negative. 50% of the wound over the right posterior ribs still covered and necrotic material. Again aggressive debridement required here. I gave him 10 days' worth of Cefdinir which should end in 3 days. He had underlying osteomyelitis under this area at one point 10/31/16. The patient arrived today with a low-grade temperature. States he doesn't really feel well. There was an odor not exactly sure if that is coming from his ribs or the extensive areas over his buttocks. He tells me he is not having diarrhea. He had a headache this morning 01/02/17; the patient arrives back in clinic today after a stay at [facility]. He had been hospitalized for an infection I believe in the right posterior rib area. He completed his antibiotics and had a wound VAC on this area while he was in the nursing home. He was discharged to home however [facility] or home health company that had them before apparently will not pick him up. [facility] came up to the house they told him they would not pick him up. His sister is using various odd combination of supplies that they have at home. Apparently the [facility] home health nurse also  called APS I'm not sure what the issue here is. [name] is cognitively intact and able to make his own decisions 01/23/17; after he was here last time there was some talk about him going back to a nursing home, apparently adult protective services was involved however in spite of this he remains at home under the care of his sister and her family. They do not have home health for reasons that are not totally clear. They've been using silver alginate and Hydrofera Blue that they had left over from previous wounds 02/13/17; since he was last here the patient has been denied for home health and there apparently was no nursing home willing to accept him or at least no nursing home that was willing to except him that he was willing to go to. He has also had problems with his hospital bed no longer working so he was sitting in this at 47. 02/27/17; since he was last here the patient is still at home with his sister. He managed to get a new bed but did not get a proper level surface. He states he feels generally well. 03/13/17; since the last time the patient was here he was hospitalized from 10/16 through 03/02/17. He arrived with chills and a temperature of 100 nausea. I think he was ultimately felt to have a UTI nothing specific  was cultured from his urine however. His wounds were seen by [name] ham of recommended local wound care. Notable for the fact that they labeled him as having severe chronic malnutrition although I don't see the verification of that. The patient states he eats well. 04/17/17; patient arrives for monthly visit. He is still living at home with his sister. He has the following wounds; ooRight posterior lateral rib cage. The surface of this does not look as good as I saw this month ago. Necrotic debris. He has had osteomyelitis in this area in the past all change the primary dressing here to Iodoflex ooMultitude of wounds over the buttocks with a probing wound into the  right hip area but no palpable bone none of this is particularly different. ooHe has a new pressure area over the left lateral greater trochanter. This is stage II superficial ooRight anterior foot area had exposed tendon last time that is closed. The patient says that he is been eating and drinking well. He may have had a temperature of about 100 over the weekend 05/01/17; patient arrives for his monthly visit. His major wounds include; ooRight posterior lateral rib cage. He has 2 open areas here almost like a figure 8. The bottom of this needed a vigorous debridement the top part doesn't look so bad ooThe wounds over the buttocks and a probing wound in the right hip area actually looks fairly good for him he seems to be doing well ooThe area over the left greater trochanter from last time has healed ooRight anterior foot/ankle is a clean superficial wound at this point it is continued to improve ooHe has 2 open areas on the distal part of his third and fourth fingers on the right hand. He states these started as blisters I am not exactly sure how this could've happened. 05/15/17; I brought the patient back in 2 weeks to mostly look at the right posterior lateral rib cage. This needed a vigorous debridement 2 weeks ago and I change the primary dressing her to Iodoflex. His other wounds are stable using silver alginate including the large area of his bilateral buttocks and the right anterior/ankle foot. Patient states he was in the hospital last week for 4 days secondary to a UTI [has a suprapubic catheter]. I'll try to review these records. He states he didn't have any relevant imaging studies 06/20/16; since the patient was last here he was admitted to hospital from 06/01/17 through 06/05/17. He was felt to have sepsis secondary to a complicated UTI treated with antibiotics. He has a suprapubic catheter. He was discharged on cefepime but has since completed that. He continues to have a  complicated wound on the right posterior rib cage, improving wounds over a large area of his buttock, probing area into the right ischial tuberosity with a history of underlying osteomyelitis. He has an area over the left anterior ankle and what looks to be of blister on the right fifth metatarsal head plantar aspect 07/10/16; patient has not been here in 3 weeks. He was in the ER with diarrhea. He was diagnosed with pseudomembranous colitis and is followed up with [name] of infectious disease on Flagyl. We have been using Hydrofera Blue to the area on the right posterior ribs silver alginate to everything else. He has a new wound on the right heel tip and several areas on the second third and fourth fingers. The exact etiology of the finger wounds isn't really clear. 08/07/17; we have been using Hydrofera  Blue to the area on the right posterior rib cage and silver alginate to the other wounds including but I ask, right dorsal foot and several areas of undetermined etiology on his dorsal fingers of the right hand. Since he was last here he was hospitalized from 07/18/17 through 07/22/17. Initially presenting with high fever 103 felt to be septic. Initially this was felt to be secondary to pneumonia possibly a UTI in the setting of a suprapubic catheter. The chest that showed basal a total ectasis with a 8.9 x 4.9 cm right fifth rib lesion. Bone scan was completed showing that the fifth 8 and ninth ribs were not consistent with specific pathology. Possible chronic osteomyelitis. The patient was seen by oncology who was initially concerned about either metastatic or primary bone malignancy. However they seem reassured by the bone scan. He is completed antibiotics. He is not feeling well with 3 full ostomy bags of liquid diarrhea per 24 hours. He states that this started right after he left the hospital. Previously seen last month by [name] and was treated with Flagyl for C. difficile. The  patient states he got better and then when he left the hospital 2 days later he is now back to having 3 ostomy bags full of liquid stool. He generally doesn't feel well. He has not been vomiting. He states he is eating and drinking 09/18/17; Patient in hospital in late April for delirium presumed to be a UTI. Culture grew multiple organisms. Nevertheless less he was treated and discharged on Keflex. No other changes 10/17/17; recent hospitalization for pneumonia since we last saw him. Silver alginate use to the wounds which by and large looks somewhat better. He has a new area on the posterior right heel. He is still at home, sister still doing the dressings 11/09/17; the patient was in the ER last week with non-he is still complaining of nausea and vomiting. He was not admitted lab work showed a white count of 9.3 and hemoglobin of 8.8 differential count was normal. Electrolytes were normal. Urinalysis was positive for large leukocytes see if they did a culture. He was given 1 g of Rocephin 12/01/17; was last here he was hospitalized from 11/17/17 through 11/27/17. Actually presented with increasing abdominal pain nausea and vomiting. He underwent a complete evaluation by GI. His colonoscopy was unremarkable. He was transfused 3 units of blood. Endoscopy showed nonerosive esophagitis. He is on a PPI but still complaining of unrelenting nausea He also saw general surgery in ID about the right sided posterior rib wounds/UTI. He was given antibiotics but finished them on 7/11 i.e. before hospitalization was ended. I suspect he was not felt to have an active infection in the bone in any of the wound areas. He is been put on Santyl in the right ribs wounds then I had a mono Iodoflex but I think the Santyl is fine. He is using wet to dry on the sacral wounds including the deep probing area into the right hip I think silver alginate some better choice here because of the drainage. 01/16/18; since the patient was  last here he was actually hospitalized in [facility] and underwent an open cholecystectomy for the unrelenting nausea. Since then he feels better. He also tells me that his right lateral rib wound was debrided in the OR as well as the new area in the left greater trochanter. I don't have any information on this. Since he has returned home he is been using Santyl to the right  lateral rib wound as well as the area on the left trochanter. Silver alginate to the areas on his buttocks and sacrum.Steward Drone on an light Coban 01/29/18; patient back in predominantly from review of the wounds in the right lateral lives and the new area from last time on the left greater trochanter. Her using Santyl to both of these wounds. Silver alginate to the areas on his buttocks and sacrum. The area on the right lateral ribs actually looks somewhat stable. The area on the left greater trochanter has no viable tissue over the wound surface 03/20/2018; patient has not been here in almost 6 weeks. He was hospitalized at [facility] from 9/22 through 10/8. Found to have osteomyelitis in the left hip also the right hemipelvis.. The area on the left greater trochanter had deteriorated.Marland Kitchen He was admitted to hospital with fever felt I think to have mostly an infection in the left greater trochanter area. CT scan from the hospitalization is listed below CONTRAST: 70 cc Omnipaque [id number] FINDINGS: Bones/Joint/Cartilage In addition to the bony destructive findings of the pelvis (see dedicated CT pelvis report), there is a large decubitus ulcer of the right buttock/upper thigh region extending into the right hip joint, with associated bony destruction in chronic osteomyelitis of the head and proximal metaphysis and diaphysis of the right proximal femur, and the adjacent acetabulum. There is an unusual pattern of flowing ossification along the margins of the large decubitus ulcer, likely from chronic osteomyelitis and  remodeling. There is a large amount of gas in what remains of the hip joint and the pseudoarticulation between the proximal femur in the bony pelvis. 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 The patient was difficult to position in the distal femur is out partially out of the range that can be accurately scanned. There is evidence of degenerative arthropathy of both knees. 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 Deformity along the right proximal tibia likely from an old healed fracture. Ligaments Suboptimally assessed by CT. Muscles and Tendons Severe muscular atrophy. Poor definition of the right gluteal musculature partially from atrophy and partially from inflammation. Prominent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

## 2019-02-19 DIAGNOSIS — R110 Nausea: Secondary | ICD-10-CM | POA: Diagnosis not present

## 2019-02-19 DIAGNOSIS — R1084 Generalized abdominal pain: Secondary | ICD-10-CM | POA: Diagnosis not present

## 2019-02-22 NOTE — Progress Notes (Signed)
IZAN, ROSSELOT ([id number]) Visit Report for 02/14/2019 Arrival Information Details Patient Name: Date of Service: [name] [name], [name] [name] 02/14/2019 2:30 PM Medical Record [id number] Patient Account Number: [id number] Date of Birth/Sex: Treating RN: Oct 15, 1965 (53 y.o. Janyth Contes Primary Care Wendy Hoback: Park Meo Other Clinician: Referring Belva Koziel: Treating Malachi Kinzler/Extender:Robson, Edythe Clarity, Campbell Riches in Treatment: 156 Visit Information History Since Last Visit Added or deleted any medications: No Patient Arrived: Wheel Chair Any new allergies or adverse reactions: No Arrival Time: 15:02 Had a fall or experienced change in No activities of daily living that may affect Accompanied By: alone risk of falls: Transfer Assistance: [employer] fast streamer Signs or symptoms of abuse/neglect since last No Patient Identification Verified: Yes visito Secondary Verification Process Yes Hospitalized since last visit: Yes Completed: Implantable device outside of the clinic excluding No Patient Requires Transmission-Based No cellular tissue based products placed in the center Precautions: since last visit: Patient Has Alerts: Yes Has Dressing in Place as Prescribed: Yes Pain Present Now: No Electronic Signature(s) Signed: 02/18/2019 6:17:12 PM By: Levan Hurst RN, BSN Entered By: Levan Hurst on 02/14/2019 15:44:54 -------------------------------------------------------------------------------- Encounter Discharge Information Details Patient Name: Date of Service: [name] 02/14/2019 2:30 PM Medical Record [id number] Patient Account Number: [id number] Date of Birth/Sex: Treating RN: [birth year] (53 y.o. Marvis Repress Primary Care Jaxin Fulfer: Park Meo Other Clinician: Referring Ronen Bromwell: Treating Jawara Latorre/Extender:Robson, Edythe Clarity, Campbell Riches in Treatment: 156 Encounter Discharge Information Items Post Procedure Vitals Discharge  Condition: Stable Temperature (F): 98.6 Ambulatory Status: Wheelchair Pulse (bpm): 109 Discharge Destination: Home Respiratory Rate (breaths/min): 16 Transportation: Other Blood Pressure (mmHg): 133/99 Accompanied By: self Schedule Follow-up Appointment: Yes Clinical Summary of Care: Patient Declined Electronic Signature(s) Signed: 02/22/2019 5:13:27 PM By: Kela Millin Entered By: Kela Millin on 02/14/2019 17:41:09 -------------------------------------------------------------------------------- Lower Extremity Assessment Details Patient Name: Date of Service: SOLOMONE, TYRIE 02/14/2019 2:30 PM Medical Record [id number] Patient Account Number: [id number] Date of Birth/Sex: Treating RN: 1966-03-03 (53 y.o. Janyth Contes Primary Care Ovida Delagarza: Park Meo Other Clinician: Referring Fatisha Rabalais: Treating Isreal Moline/Extender:Robson, Edythe Clarity, Campbell Riches in Treatment: 156 Edema Assessment Assessed: [Left: No] [Right: No] Edema: [Left: No] [Right: No] Calf Left: Right: Point of Measurement: 35 cm From Medial Instep cm cm Ankle Left: Right: Point of Measurement: 9 cm From Medial Instep cm cm Vascular Assessment Pulses: Dorsalis Pedis Palpable: [Right:Yes] Electronic Signature(s) Signed: 02/18/2019 6:17:12 PM By: Levan Hurst RN, BSN Entered By: Levan Hurst on 02/14/2019 15:46:26 -------------------------------------------------------------------------------- [place] Details Patient Name: Date of Service: [name] 02/14/2019 2:30 PM Medical Record [id number] Patient Account Number: [id number] Date of Birth/Sex: Treating RN: [birth year] (53 y.o. [name] Primary Care Rosemary Mossbarger: Park Meo Other Clinician: Referring Dilara Navarrete: Treating Fidela Cieslak/Extender:Robson, Edythe Clarity, Campbell Riches in Treatment: 156 Active Inactive Pressure Nursing Diagnoses: Potential for impaired tissue integrity  related to pressure, friction, moisture, and shear Goals: Patient/caregiver will verbalize understanding of pressure ulcer management Date Initiated: 02/12/2016 Target Resolution Date: 03/15/2019 Goal Status: Active Interventions: Assess offloading mechanisms upon admission and as needed Notes: Electronic Signature(s) Signed: 02/14/2019 6:31:45 PM By: [name] Entered By: [name] on 02/14/2019 15:38:59 -------------------------------------------------------------------------------- Pain Assessment Details Patient Name: Date of Service: JAHEIR, PAISLEY 02/14/2019 2:30 PM Medical Record [id number] Patient Account Number: [id number] Date of Birth/Sex: Treating RN: [birth year] (53 y.o. Janyth Contes Primary Care Dallan Schonberg: Park Meo Other Clinician: Referring Breylen Agyeman: Treating Gordana Kewley/Extender:Robson, Edythe Clarity, Campbell Riches in Treatment: 156 Active Problems Location of Pain Severity and Description of Pain Patient Has Paino No Site Locations  Pain Management and Medication Current Pain Management: Electronic Signature(s) Signed: 02/18/2019 6:17:12 PM By: Levan Hurst RN, BSN Entered By: Levan Hurst on 02/14/2019 15:46:15 -------------------------------------------------------------------------------- Patient/Caregiver Education Details Patient Name: Date of Service: [name] 10/1/2020andnbsp2:30 PM Medical Record [id number] Patient Account Number: [id number] Date of Birth/Gender: Treating RN: 1966/01/17 (53 y.o. [name] Primary Care Physician: Park Meo Other Clinician: Referring Physician: Treating Physician/Extender:Robson, Edythe Clarity, Campbell Riches in Treatment: 156 Education Assessment Education Provided To: Patient Education Topics Provided Wound/Skin Impairment: Handouts: Caring for Your Ulcer Methods: Explain/Verbal Responses: Reinforcements needed Electronic Signature(s) Signed: 02/14/2019  6:31:45 PM By: [name] Entered By: [name] on 02/14/2019 15:39:17 -------------------------------------------------------------------------------- Wound Assessment Details Patient Name: Date of Service: TERREN, KALLSTROM 02/14/2019 2:30 PM Medical Record [id number] Patient Account Number: [id number] Date of Birth/Sex: Treating RN: Feb 18, 1966 (53 y.o. Janyth Contes Primary Care Virgilio Broadhead: Park Meo Other Clinician: Referring Tiesha Marich: Treating Macy Lingenfelter/Extender:Robson, Edythe Clarity, Campbell Riches in Treatment: 156 Wound Status Wound Number: 34 Primary Pressure Ulcer Etiology: Etiology: Wound Location: Sacrum Wound Open Wounding Event: Pressure Injury Status: Date Acquired: 05/17/2010 Comorbid Anemia, Sleep Apnea, Hypertension, Weeks Of Treatment: 156 History: Quadriplegia, Seizure Disorder Clustered Wound: Yes Photos Wound Measurements Length: (cm) 8 % Reduction i Width: (cm) 12.5 % Reduction i Depth: (cm) 0.1 Epithelializa Clustered Quantity: 1 Tunneling: Area: (cm) 78.54 Undermining: Volume: (cm) 7.854 Wound Description Classification: Category/Stage IV Foul Odor Af Wound Margin: Distinct, outline attached Slough/Fibri Exudate Amount: Large Exudate Type: Serosanguineous Exudate Color: red, brown Wound Bed Granulation Amount: Medium (34-66%) Granulation Quality: Pink Fascia Expose Necrotic Amount: Medium (34-66%) Fat Layer (Su Necrotic Quality: Adherent Slough Tendon Expose Muscle Expose Joint Exposed Bone Exposed: ter Cleansing: No no No Exposed Structure d: No bcutaneous Tissue) Exposed: Yes d: No d: No : No No n Area: 80.2% n Volume: 98.7% tion: None No No Treatment Notes Wound #34 (Sacrum) 1. Cleanse With Wound Cleanser 2. Periwound Care Skin Prep 3. Primary Dressing Applied Calcium Alginate Ag 4. Secondary Dressing ABD Pad Dry Gauze 5. Secured With Recruitment consultant) Signed: 02/15/2019 10:20:10 AM  By: Mikeal Hawthorne EMT/HBOT Signed: 02/18/2019 6:17:12 PM By: Levan Hurst RN, BSN Entered By: Mikeal Hawthorne on 02/15/2019 08:17:44 -------------------------------------------------------------------------------- Wound Assessment Details Patient Name: Date of Service: [name] 02/14/2019 2:30 PM Medical Record [id number] Patient Account Number: [id number] Date of Birth/Sex: Treating RN: 1965/12/06 (53 y.o. Janyth Contes Primary Care Malakhi Markwood: Park Meo Other Clinician: Referring Lavanya Roa: Treating Hali Balgobin/Extender:Robson, Edythe Clarity, Campbell Riches in Treatment: 156 Wound Status Wound Number: 37 Primary Pressure Ulcer Etiology: Wound Location: Right Back Wound Open Wounding Event: Pressure Injury Status: Date Acquired: 12/15/2015 Comorbid Anemia, Sleep Apnea, Hypertension, Weeks Of Treatment: 156 History: Quadriplegia, Seizure Disorder Clustered Wound: No Photos Wound Measurements Length: (cm) 11.5 Width: (cm) 6 Depth: (cm) 0.6 Area: (cm) 54.192 Volume: (cm) 32.515 Wound Description Classification: Category/Stage IV Wound Margin: Flat and Intact Exudate Amount: Large Exudate Type: Serosanguineous Exudate Color: red, brown Wound Bed Granulation Amount: Medium (34-66%) Granulation Quality: Pink Necrotic Amount: Medium (34-66%) Necrotic Quality: Adherent Slough Treatment Notes Wound #37 (Right Back) 1. Cleanse With Wound Cleanser 2. Periwound Care Skin Prep 3. Primary Dressing Applied Calcium Alginate Ag 4. Secondary Dressing Dry Gauze Foam Border Dressing fter Cleansing: No ino Yes Exposed Structure ed: No ubcutaneous Tissue) Exposed: Yes ed: No ed: No d: No : No % Reduction in Area: -239.1% % Reduction in Volume: -1934.7% Epithelialization: Small (1-33%) Tunneling: No Undermining: No Foul Odor A Slough/Fibr Fascia Expos Fat Layer (S  Tendon Expos Muscle Expos Joint Expose Bone Exposed Electronic  Signature(s) Signed: 02/15/2019 10:20:10 AM By: Mikeal Hawthorne EMT/HBOT Signed: 02/18/2019 6:17:12 PM By: Levan Hurst RN, BSN Previous Signature: 02/14/2019 6:31:45 PM Version By: [name] Entered By: Mikeal Hawthorne on 02/15/2019 08:29:17 -------------------------------------------------------------------------------- Wound Assessment Details Patient Name: Date of Service: [name] 02/14/2019 2:30 PM Medical Record [id number] Patient Account Number: [id number] Date of Birth/Sex: Treating RN: 22-Jun-1965 (53 y.o. [name] Primary Care Eleanor Dimichele: Park Meo Other Clinician: Referring Jaquae Rieves: Treating Binh Doten/Extender:Robson, Edythe Clarity, Campbell Riches in Treatment: 156 Wound Status Wound Number: 57 Primary Pressure Ulcer Etiology: Wound Location: Right Trochanter Wound Open Wounding Event: Pressure Injury Status: Date Acquired: 09/18/2017 Comorbid Anemia, Sleep Apnea, Hypertension, Weeks Of Treatment: 73 History: Quadriplegia, Seizure Disorder Clustered Wound: Yes Photos Wound Measurements Length: (cm) 0.7 % Reductio Width: (cm) 1.5 % Reductio Depth: (cm) 3 Epithelial Clustered Quantity: 2 Tunneling: Area: (cm) 0.825 Undermin Volume: (cm) 2.474 Wound Description Classification: Category/Stage IV Wound Margin: Indistinct, nonvisible Exudate Amount: Medium Exudate Type: Serosanguineous Exudate Color: red, brown Wound Bed Granulation Amount: Large (67-100%) Granulation Quality: Red Necrotic Amount: None Present (0%) Foul Odor After Cleansing: No Slough/Fibrino No Exposed Structure Fascia Exposed: No Fat Layer (Subcutaneous Tissue) Exposed: Yes Tendon Exposed: No Muscle Exposed: No Joint Exposed: No Bone Exposed: No n in Area: 96.9% n in Volume: 5.8% ization: Large (67-100%) No ing: No Treatment Notes Wound #57 (Right Trochanter) 1. Cleanse With Wound Cleanser 2. Periwound Care Skin Prep 3. Primary Dressing  Applied Calcium Alginate Ag 4. Secondary Dressing ABD Pad Dry Gauze 5. Secured With Recruitment consultant) Signed: 02/15/2019 10:20:10 AM By: Mikeal Hawthorne EMT/HBOT Signed: 02/15/2019 6:13:58 PM By: [name] Previous Signature: 02/14/2019 6:31:45 PM Version By: [name] Entered By: Mikeal Hawthorne on 02/15/2019 08:22:07 -------------------------------------------------------------------------------- Wound Assessment Details Patient Name: Date of Service: [name] 02/14/2019 2:30 PM Medical Record [id number] Patient Account Number: [id number] Date of Birth/Sex: Treating RN: Aug 27, 1965 (53 y.o. Janyth Contes Primary Care Ressie Slevin: Park Meo Other Clinician: Referring Myla Mauriello: Treating Amiaya Mcneeley/Extender:Robson, Edythe Clarity, Campbell Riches in Treatment: 156 Wound Status Wound Number: 59 Primary Pressure Ulcer Etiology: Wound Location: Left Ischium Wound Open Wounding Event: Pressure Injury Wounding Event: Pressure Injury Status: Date Acquired: 10/17/2017 Comorbid Anemia, Sleep Apnea, Hypertension, Weeks Of Treatment: 69 History: Quadriplegia, Seizure Disorder Clustered Wound: No Photos Wound Measurements Length: (cm) 10.5 Width: (cm) 13 Depth: (cm) 0.5 Area: (cm) 107.207 Volume: (cm) 53.603 Wound Description Classification: Category/Stage III Wound Margin: Flat and Intact Exudate Amount: Large Exudate Type: Serosanguineous Exudate Color: red, brown Wound Bed Granulation Amount: Medium (34-66%) Granulation Quality: Red Necrotic Amount: Medium (34-66%) Necrotic Quality: Adherent Slough fter Cleansing: No ino Yes Exposed Structure ed: No ubcutaneous Tissue) Exposed: Yes ed: No ed: No d: No : No % Reduction in Area: -60.5% % Reduction in Volume: -100.6% Epithelialization: None Tunneling: No Undermining: No Foul Odor A Slough/Fibr Fascia Expos Fat Layer (S Tendon Expos Muscle Expos Joint Expose Bone  Exposed Treatment Notes Wound #59 (Left Ischium) 1. Cleanse With Wound Cleanser 2. Periwound Care Skin Prep 3. Primary Dressing Applied Calcium Alginate Ag 4. Secondary Dressing ABD Pad Dry Gauze 5. Secured With Recruitment consultant) Signed: 02/15/2019 10:20:10 AM By: Mikeal Hawthorne EMT/HBOT Signed: 02/18/2019 6:17:12 PM By: Levan Hurst RN, BSN Entered By: Mikeal Hawthorne on 02/15/2019 08:18:36 -------------------------------------------------------------------------------- Wound Assessment Details Patient Name: Date of Service: [name] 02/14/2019 2:30 PM Medical Record [id number] Patient Account Number: [id number] Date of Birth/Sex: Treating RN: 1966-03-22 (53  y.o. Jerilynn Mages) Donnal Debar, Shatara Primary Care Geordie Nooney: Park Meo Other Clinician: Referring Navin Dogan: Treating Suraj Ramdass/Extender:Robson, Edythe Clarity, Campbell Riches in Treatment: 156 Wound Status Wound Number: 62 Primary Pressure Ulcer Etiology: Wound Location: Left Trochanter - Lateral Wound Open Wounding Event: Pressure Injury Status: Date Acquired: 11/13/2017 Comorbid Anemia, Sleep Apnea, Hypertension, Weeks Of Treatment: 56 History: Quadriplegia, Seizure Disorder Clustered Wound: No Photos Wound Measurements Length: (cm) 6 % Reduction in Ar Width: (cm) 4.7 % Reduction in Vo Depth: (cm) 0.5 Epithelialization Area: (cm) 22.148 Tunneling: Volume: (cm) 11.074 Undermining: Wound Description Classification: Category/Stage IV Foul Odor After Wound Margin: Well defined, not attached Slough/Fibrino Exudate Amount: Medium Exudate Type: Serosanguineous Exudate Color: red, brown Wound Bed Granulation Amount: Medium (34-66%) Granulation Quality: Pink Fascia Exposed: Necrotic Amount: Medium (34-66%) Fat Layer (Subcut Necrotic Quality: Adherent Slough Tendon Exposed: Muscle Exposed: Joint Exposed: Bone Exposed: Cleansing: No Yes Exposed Structure No aneous Tissue) Exposed:  Yes No No No No ea: -37% lume: -585.3% : Small (1-33%) No No Treatment Notes Wound #62 (Left, Lateral Trochanter) 1. Cleanse With Wound Cleanser 2. Periwound Care Skin Prep 3. Primary Dressing Applied Santyl 4. Secondary Dressing Dry Gauze Foam Border Dressing Electronic Signature(s) Signed: 02/15/2019 10:20:10 AM By: Mikeal Hawthorne EMT/HBOT Signed: 02/18/2019 6:17:12 PM By: Levan Hurst RN, BSN Entered By: Mikeal Hawthorne on 02/15/2019 08:16:47 -------------------------------------------------------------------------------- Wound Assessment Details Patient Name: Date of Service: [name] 02/14/2019 2:30 PM Medical Record [id number] Patient Account Number: [id number] Date of Birth/Sex: Treating RN: 1965-11-08 (53 y.o. Janyth Contes Primary Care Frankie Scipio: Park Meo Other Clinician: Referring Celes Dedic: Treating Clance Baquero/Extender:Robson, Edythe Clarity, Campbell Riches in Treatment: 156 Wound Status Wound Number: 66 Primary Abrasion Etiology: Wound Location: Left Elbow Wound Open Wounding Event: Trauma Status: Date Acquired: 07/26/2018 Comorbid Anemia, Sleep Apnea, Hypertension, Weeks Of Treatment: 28 History: Quadriplegia, Seizure Disorder Clustered Wound: No Photos Wound Measurements Length: (cm) 0.4 % Reduction Width: (cm) 0.4 % Reduction Depth: (cm) 0.1 Epithelializ Area: (cm) 0.126 Tunneling: Volume: (cm) 0.013 Undermining Wound Description Classification: Full Thickness Without Exposed Support Foul Od Structures Slough/ Wound Distinct, outline attached Margin: Exudate Small Amount: Exudate Serosanguineous Type: Exudate red, brown Color: Wound Bed Granulation Amount: Large (67-100%) Granulation Quality: Pink Fascia E Necrotic Amount: None Present (0%) Fat Laye Tendon E Muscle E Joint Ex Bone Exp or After Cleansing: No Fibrino No Exposed Structure xposed: No r (Subcutaneous Tissue) Exposed: No xposed:  No xposed: No posed: No osed: No in Area: 85.4% in Volume: 84.9% ation: Large (67-100%) No : No Treatment Notes Wound #66 (Left Elbow) 1. Cleanse With Wound Cleanser 2. Periwound Care Skin Prep 3. Primary Dressing Applied Calcium Alginate Ag 4. Secondary Dressing Dry Gauze Foam Border Dressing Electronic Signature(s) Signed: 02/15/2019 10:20:10 AM By: Mikeal Hawthorne EMT/HBOT Signed: 02/18/2019 6:17:12 PM By: Levan Hurst RN, BSN Entered By: Mikeal Hawthorne on 02/15/2019 08:17:17 -------------------------------------------------------------------------------- Wound Assessment Details Patient Name: Date of Service: [name] 02/14/2019 2:30 PM Medical Record [id number] Patient Account Number: [id number] Date of Birth/Sex: Treating RN: 19-Nov-1965 (53 y.o. Janyth Contes Primary Care Arta Stump: Park Meo Other Clinician: Referring Isidore Margraf: Treating Jarin Cornfield/Extender:Robson, Edythe Clarity, Campbell Riches in Treatment: 156 Wound Status Wound Number: 67 Primary Pressure Ulcer Etiology: Wound Location: Perineum Wound Open Wounding Event: Gradually Appeared Status: Date Acquired: 07/27/2018 Comorbid Anemia, Sleep Apnea, Hypertension, Weeks Of Treatment: 28 Weeks Of Treatment: 28 History: Quadriplegia, Seizure Disorder Clustered Wound: No Photos Wound Measurements Length: (cm) 13.5 Width: (cm) 14 Depth: (cm) 0.1 Area: (cm) 148.44 Volume: (cm) 14.844 Wound Description Classification:  Category/Stage II Wound Margin: Distinct, outline attached Exudate Amount: Large Exudate Type: Serosanguineous Exudate Color: red, brown Wound Bed Granulation Amount: Large (67-100%) Granulation Quality: Red, Pink Necrotic Amount: Small (1-33%) Necrotic Quality: Adherent Slough After Cleansing: No brino No Exposed Structure osed: No (Subcutaneous Tissue) Exposed: Yes osed: No osed: No sed: No ed: No % Reduction in Area: -1442.9% % Reduction in  Volume: -1443% Epithelialization: None Tunneling: No Undermining: No Foul Odor Slough/Fi Fascia Exp Fat Layer Tendon Exp Muscle Exp Joint Expo Bone Expos Treatment Notes Wound #67 (Perineum) 1. Cleanse With Wound Cleanser 2. Periwound Care Skin Prep 3. Primary Dressing Applied Calcium Alginate Ag 4. Secondary Dressing ABD Pad Dry Gauze 5. Secured With Recruitment consultant) Signed: 02/15/2019 10:20:10 AM By: Mikeal Hawthorne EMT/HBOT Signed: 02/18/2019 6:17:12 PM By: Levan Hurst RN, BSN Entered By: Mikeal Hawthorne on 02/15/2019 08:18:10 -------------------------------------------------------------------------------- Wound Assessment Details Patient Name: Date of Service: [name] 02/14/2019 2:30 PM Medical Record [id number] Patient Account Number: [id number] Date of Birth/Sex: Treating RN: Dec 31, 1965 (53 y.o. Janyth Contes Primary Care Denvil Canning: Park Meo Other Clinician: Referring Renato Spellman: Treating George Haggart/Extender:Robson, Edythe Clarity, Campbell Riches in Treatment: 156 Wound Status Wound Number: 68 Primary Pressure Ulcer Etiology: Wound Location: Right Upper Leg - Anterior Wound Open Wounding Event: Gradually Appeared Status: Date Acquired: 10/15/2018 Comorbid Anemia, Sleep Apnea, Hypertension, Weeks Of Treatment: 10 History: Quadriplegia, Seizure Disorder Clustered Wound: No Photos Wound Measurements Length: (cm) 1.9 % Reduction in Width: (cm) 2.8 % Reduction in Depth: (cm) 0.2 Epithelializati Area: (cm) 4.178 Tunneling: Volume: (cm) 0.836 Undermining: Wound Description Classification: Unstageable/Unclassified Foul Odor Afte Wound Margin: Flat and Intact Slough/Fibrino Exudate Amount: Medium Exudate Type: Serosanguineous Exudate Color: red, brown Wound Bed Granulation Amount: Medium (34-66%) Granulation Quality: Red Fascia Exposed: Necrotic Amount: Medium (34-66%) Fat Layer (Subc Necrotic Quality: Adherent  Slough Tendon Exposed: Muscle Exposed: Joint Exposed: Bone Exposed: r Cleansing: No Yes Exposed Structure No utaneous Tissue) Exposed: Yes No No No No Area: 66.8% Volume: 33.5% on: Small (1-33%) No No Treatment Notes Wound #68 (Right, Anterior Upper Leg) 1. Cleanse With Wound Cleanser 2. Periwound Care Skin Prep 3. Primary Dressing Applied Santyl 4. Secondary Dressing Dry Gauze Foam Border Dressing Electronic Signature(s) Signed: 02/15/2019 10:20:10 AM By: Mikeal Hawthorne EMT/HBOT Signed: 02/18/2019 6:17:12 PM By: Levan Hurst RN, BSN Entered By: Mikeal Hawthorne on 02/15/2019 08:12:33 -------------------------------------------------------------------------------- Wound Assessment Details Patient Name: Date of Service: [name] 02/14/2019 2:30 PM Medical Record [id number] Patient Account Number: [id number] Date of Birth/Sex: Treating RN: 1965-07-14 (53 y.o. Janyth Contes Primary Care Duane Earnshaw: Park Meo Other Clinician: Referring Trajan Grove: Treating Zamarian Scarano/Extender:Robson, Edythe Clarity, Campbell Riches in Treatment: 156 Wound Status Wound Number: 69 Primary Pressure Ulcer Etiology: Wound Location: Right Abdomen - Lower Quadrant - Lateral Wound Open Status: Wounding Event: Pressure Injury Comorbid Anemia, Sleep Apnea, Hypertension, Date Acquired: 11/19/2018 History: Quadriplegia, Seizure Disorder Weeks Of Treatment: 10 Clustered Wound: No Photos Wound Measurements Length: (cm) 2.2 Width: (cm) 5.4 Depth: (cm) 0.1 Area: (cm) 9.331 Volume: (cm) 0.933 Wound Description Classification: Unstageable/Unclassified Wound Margin: Flat and Intact Exudate Amount: Medium Exudate Type: Serosanguineous Exudate Color: red, brown Wound Bed Granulation Amount: Large (67-100%) Granulation Quality: Red Necrotic Amount: Small (1-33%) Necrotic Quality: Adherent Slough After Cleansing: No rino No Exposed Structure sed: No Subcutaneous  Tissue) Exposed: Yes sed: No sed: No ed: No d: No % Reduction in Area: 24.6% % Reduction in Volume: 24.6% Epithelialization: Small (1-33%) Tunneling: No Undermining: No Foul Odor Slough/Fib Fascia Expo Fat Layer ( Tendon Expo  Muscle Expo Joint Expos Bone Expose Treatment Notes Wound #69 (Right, Lateral Abdomen - Lower Quadrant) 1. Cleanse With Wound Cleanser 2. Periwound Care Skin Prep 3. Primary Dressing Applied Calcium Alginate Ag 4. Secondary Dressing Dry Gauze Foam Border Dressing Electronic Signature(s) Signed: 02/15/2019 10:20:10 AM By: Mikeal Hawthorne EMT/HBOT Signed: 02/18/2019 6:17:12 PM By: Levan Hurst RN, BSN Entered By: Mikeal Hawthorne on 02/15/2019 08:16:21 -------------------------------------------------------------------------------- Wound Assessment Details Patient Name: Date of Service: [name] 02/14/2019 2:30 PM Medical Record [id number] Patient Account Number: [id number] Date of Birth/Sex: Treating RN: [birth year] (53 y.o. Janyth Contes Primary Care Aizley Stenseth: Park Meo Other Clinician: Referring Neshia Mckenzie: Treating Aleks Nawrot/Extender:Robson, Edythe Clarity, Campbell Riches in Treatment: 156 Wound Status Wound Number: 70 Primary Pressure Ulcer Etiology: Wound Location: Abdomen - midline Wound Healed - Epithelialized Wounding Event: Gradually Appeared Status: Date Acquired: 11/30/2018 Comorbid Anemia, Sleep Apnea, Hypertension, Weeks Of Treatment: 10 History: Quadriplegia, Seizure Disorder Clustered Wound: No Photos Wound Measurements Length: (cm) 0 % Reduction Width: (cm) 0 % Reduction Depth: (cm) 0 Epitheliali Area: (cm) 0 Tunneling: Volume: (cm) 0 Underminin Wound Description Classification: Category/Stage II Foul Odor Wound Margin: Flat and Intact Slough/Fib Exudate Amount: None Present Wound Bed Granulation Amount: None Present (0%) Necrotic Amount: None Present (0%) Fascia Expo Fat Layer ( Tendon  Expo Muscle Expo Joint Expos Bone Expose After Cleansing: No rino No Exposed Structure sed: No Subcutaneous Tissue) Exposed: No sed: No sed: No ed: No d: No in Area: 100% in Volume: 100% zation: Large (67-100%) No g: No Electronic Signature(s) Signed: 02/15/2019 10:20:10 AM By: Mikeal Hawthorne EMT/HBOT Signed: 02/18/2019 6:17:12 PM By: Levan Hurst RN, BSN Entered By: Mikeal Hawthorne on 02/15/2019 08:14:35 -------------------------------------------------------------------------------- Wound Assessment Details Patient Name: Date of Service: [name] 02/14/2019 2:30 PM Medical Record [id number] Patient Account Number: [id number] Date of Birth/Sex: Treating RN: 1966-03-15 (53 y.o. Janyth Contes Primary Care Hagan Maltz: Park Meo Other Clinician: Referring Mcarthur Ivins: Treating Tamiyah Moulin/Extender:Robson, Edythe Clarity, Campbell Riches in Treatment: 156 Wound Status Wound Number: 71 Primary Pressure Ulcer Etiology: Wound Location: Right Flank - Proximal Wound Open Wounding Event: Pressure Injury Status: Date Acquired: 02/14/2019 Comorbid Anemia, Sleep Apnea, Hypertension, Weeks Of Treatment: 0 History: Quadriplegia, Seizure Disorder History: Quadriplegia, Seizure Disorder Clustered Wound: No Photos Wound Measurements Length: (cm) 3 Width: (cm) 8 Depth: (cm) 0.1 Area: (cm) 18.85 Volume: (cm) 1.885 Wound Description Classification: Unstageable/Unclassified Wound Margin: Flat and Intact Exudate Amount: Medium Exudate Type: Serosanguineous Exudate Color: red, brown Wound Bed Granulation Amount: None Present (0%) Necrotic Amount: Large (67-100%) Necrotic Quality: Eschar, Adherent Slough Exposed Structure posed: No (Subcutaneous Tissue) Exposed: No posed: No posed: No osed: No sed: No % Reduction in Area: 0% % Reduction in Volume: 0% Epithelialization: None Tunneling: No Undermining: No Fascia Ex Fat Layer Tendon Ex Muscle Ex Joint  Exp Bone Expo Treatment Notes Wound #71 (Right, Proximal Flank) 1. Cleanse With Wound Cleanser 2. Periwound Care Skin Prep 3. Primary Dressing Applied Santyl 4. Secondary Dressing Dry Gauze Foam Border Dressing Electronic Signature(s) Signed: 02/15/2019 10:20:10 AM By: Mikeal Hawthorne EMT/HBOT Signed: 02/18/2019 6:17:12 PM By: Levan Hurst RN, BSN Previous Signature: 02/14/2019 6:31:45 PM Version By: [name] Entered By: Mikeal Hawthorne on 02/15/2019 08:30:40 -------------------------------------------------------------------------------- Wound Assessment Details Patient Name: Date of Service: [name] 02/14/2019 2:30 PM Medical Record [id number] Patient Account Number: [id number] Date of Birth/Sex: Treating RN: 09/03/1965 (53 y.o. Janyth Contes Primary Care Lori Popowski: Park Meo Other Clinician: Referring Boyce Keltner: Treating Savina Olshefski/Extender:Robson, Edythe Clarity, Campbell Riches in Treatment: 156 Wound Status Wound  Number: 83 Primary Pressure Ulcer Etiology: Wound Location: Right Toe Fifth - Lateral Wound Open Wounding Event: Pressure Injury Status: Date Acquired: 02/11/2019 Comorbid Anemia, Sleep Apnea, Hypertension, Weeks Of Treatment: 0 History: Quadriplegia, Seizure Disorder Clustered Wound: No Photos Wound Measurements Length: (cm) 1 % Reduction in A Width: (cm) 0.5 % Reduction in V Depth: (cm) 0.1 Epithelializatio Area: (cm) 0.393 Tunneling: Volume: (cm) 0.039 Undermining: Wound Description Classification: Category/Stage II Foul Odor After Wound Margin: Flat and Intact Slough/Fibrino Exudate Amount: Small Exudate Type: Purulent Exudate Color: yellow, brown, green Wound Bed Granulation Amount: Large (67-100%) Granulation Quality: Pink Fascia Exposed: Necrotic Amount: None Present (0%) Fat Layer (Subcu Tendon Exposed: Muscle Exposed: Joint Exposed: Bone Exposed: Cleansing: No No Exposed Structure No taneous Tissue)  Exposed: Yes No No No No rea: 0% olume: 0% n: None No No Treatment Notes Wound #72 (Right, Lateral Toe Fifth) 1. Cleanse With Wound Cleanser 2. Periwound Care Skin Prep 3. Primary Dressing Applied Santyl 4. Secondary Dressing Dry Gauze Foam Border Dressing Electronic Signature(s) Signed: 02/15/2019 10:20:10 AM By: Mikeal Hawthorne EMT/HBOT Signed: 02/18/2019 6:17:12 PM By: Levan Hurst RN, BSN Entered By: Mikeal Hawthorne on 02/15/2019 08:23:30 -------------------------------------------------------------------------------- Wound Assessment Details Patient Name: Date of Service: [name] 02/14/2019 2:30 PM Medical Record [id number] Patient Account Number: [id number] Date of Birth/Sex: Treating RN: 11/03/1965 (53 y.o. Janyth Contes Primary Care Evin Loiseau: Park Meo Other Clinician: Referring Ruqayya Ventress: Treating Xara Paulding/Extender:Robson, Edythe Clarity, Campbell Riches in Treatment: 156 Wound Status Wound Number: 73 Primary Pressure Ulcer Etiology: Wound Location: Right Toe Fifth - Medial Wound Open Wounding Event: Pressure Injury Status: Date Acquired: 02/11/2019 Comorbid Anemia, Sleep Apnea, Hypertension, Weeks Of Treatment: 0 History: Quadriplegia, Seizure Disorder Clustered Wound: No Photos Wound Measurements Length: (cm) 0.5 Width: (cm) 0.5 Depth: (cm) 0.1 Area: (cm) 0.196 Volume: (cm) 0.02 Wound Description Classification: Category/Stage II Wound Margin: Flat and Intact Exudate Amount: Small Exudate Type: Purulent Exudate Color: yellow, brown, green Wound Bed Granulation Amount: Large (67-100%) Granulation Quality: Pink Necrotic Amount: None Present (0%) After Cleansing: No brino No Exposed Structure sed: No Subcutaneous Tissue) Exposed: Yes sed: No sed: No ed: No d: No % Reduction in Area: 0% % Reduction in Volume: 0% Epithelialization: None Tunneling: No Undermining: No Foul Odor Slough/Fi Fascia Expo Fat Layer  ( Tendon Expo Muscle Expo Joint Expos Bone Expose Treatment Notes Wound #73 (Right, Medial Toe Fifth) 1. Cleanse With Wound Cleanser 2. Periwound Care Skin Prep 3. Primary Dressing Applied Calcium Alginate Ag Notes rolled with conform Electronic Signature(s) Signed: 02/15/2019 10:20:10 AM By: Mikeal Hawthorne EMT/HBOT Signed: 02/18/2019 6:17:12 PM By: Levan Hurst RN, BSN Entered By: Mikeal Hawthorne on 02/15/2019 08:26:58 -------------------------------------------------------------------------------- Wound Assessment Details Patient Name: Date of Service: [name] 02/14/2019 2:30 PM Medical Record [id number] Patient Account Number: [id number] Date of Birth/Sex: Treating RN: [birth year] (53 y.o. [name] Primary Care Jess Toney: Park Meo Other Clinician: Referring Ziaire Hagos: Treating Meah Jiron/Extender:Robson, Edythe Clarity, Campbell Riches in Treatment: 156 Wound Status Wound Number: 74 Primary Pressure Ulcer Etiology: Wound Location: Right Flank - Distal Wound Open Wounding Event: Pressure Injury Status: Date Acquired: 02/02/2019 Comorbid Anemia, Sleep Apnea, Hypertension, Weeks Of Treatment: 0 History: Quadriplegia, Seizure Disorder Clustered Wound: No Photos Wound Measurements Length: (cm) 7.5 % Reductio Width: (cm) 11 % Reductio Depth: (cm) 0.1 Epithelial Area: (cm) 64.795 Tunneling Volume: (cm) 6.48 Undermini Wound Description Classification: Category/Stage IV Wound Margin: Distinct, outline attached Exudate Amount: None Present Wound Bed Necrotic Amount: Large (67-100%) Necrotic Quality: Eschar Foul Odor After Cleansing: No  Slough/Fibrino No Exposed Structure Fascia Exposed: No Fat Layer (Subcutaneous Tissue) Exposed: No Tendon Exposed: No Muscle Exposed: No Joint Exposed: No Bone Exposed: No n in Area: 0% n in Volume: 0% ization: Small (1-33%) : No ng: No Treatment Notes Wound #74 (Right, Distal Flank) 1. Cleanse  With Wound Cleanser 2. Periwound Care Skin Prep 3. Primary Dressing Applied Calcium Alginate Ag 4. Secondary Dressing ABD Pad Dry Gauze 5. Secured With Recruitment consultant) Signed: 02/15/2019 10:20:10 AM By: Mikeal Hawthorne EMT/HBOT Signed: 02/15/2019 6:13:58 PM By: [name] Previous Signature: 02/14/2019 6:31:45 PM Version By: [name] Entered By: Mikeal Hawthorne on 02/15/2019 08:29:51 -------------------------------------------------------------------------------- Vitals Details Patient Name: Date of Service: [name]. 02/14/2019 2:30 PM Medical Record [id number] Patient Account Number: [id number] Date of Birth/Sex: Treating RN: February 05, 1966 (53 y.o. Janyth Contes Primary Care Deazia Lampi: Park Meo Other Clinician: Referring Bisma Klett: Treating Myleigh Amara/Extender:Robson, Edythe Clarity, Campbell Riches in Treatment: 156 Vital Signs Time Taken: 15:05 Temperature (F): 98.6 Height (in): 72 Pulse (bpm): 109 Weight (lbs): 195 Respiratory Rate (breaths/min): 16 Body Mass Index (BMI): 26.4 Blood Pressure (mmHg): 133/99 Reference Range: 80 - 120 mg / dl Electronic Signature(s) Signed: 02/18/2019 6:17:12 PM By: Levan Hurst RN, BSN Entered By: Levan Hurst on 02/14/2019 15:45:19

## 2019-02-25 IMAGING — XA IR US GUIDE VASC ACCESS RIGHT
1 series · 1 of 1 positions shown · non-contrast
Comparison: none

CLINICAL DATA: Quadriplegia, chronic osteomyelitis, preop planning
for port catheter placement. needs at IV for sedation and
preprocedural antibiotics. Peripheral IV cannot be achieved by
nursing.

[Series 300: line placements · 1 of 1 slices shown]
[im 1/1]
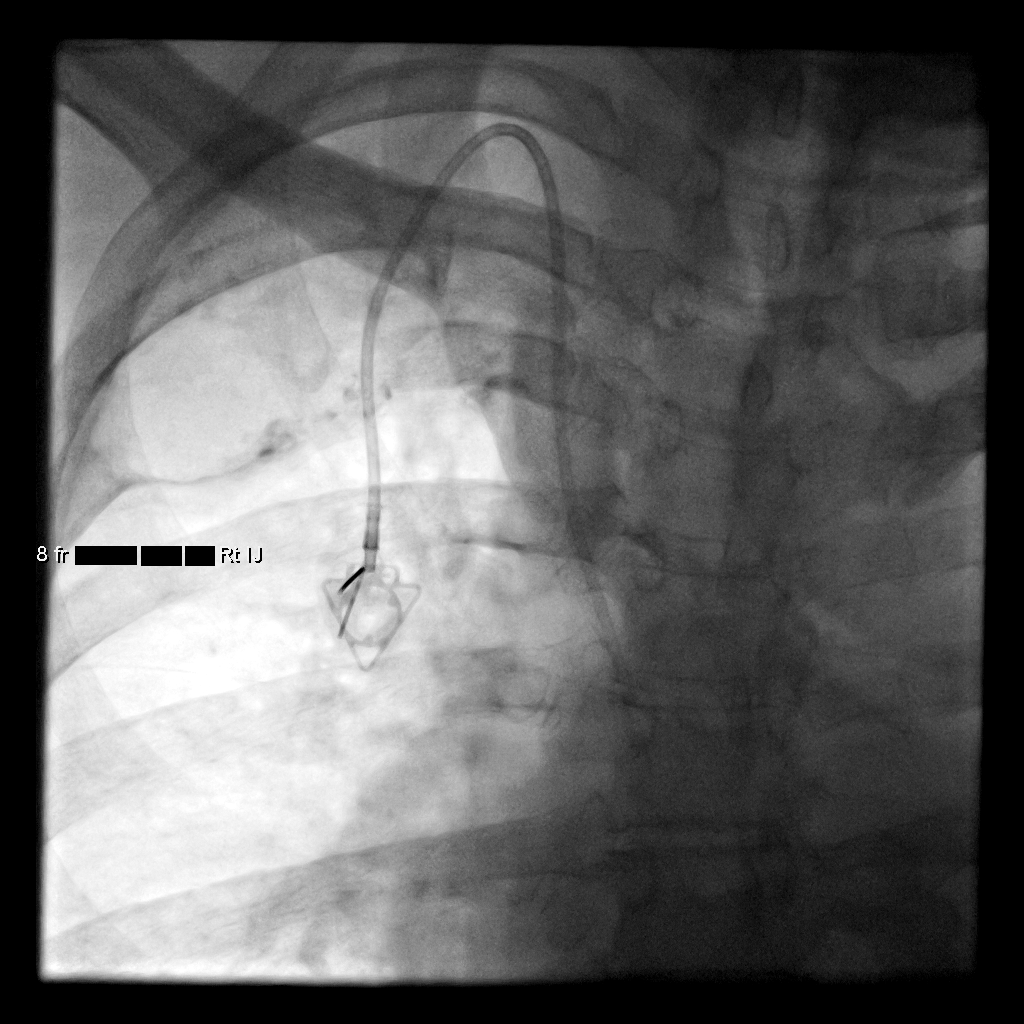

[1 of 1 positions shown; findings below may reference images not displayed]

EXAM:
VENIPUNCTURE BY [facility]; IR ULTRASOUND GUIDANCE VASC ACCESS RIGHT

ANESTHESIA/SEDATION:
None

MEDICATIONS:
Lidocaine 1% subcutaneous

PROCEDURE:
The procedure, risks (including but not limited to bleeding,
infection, organ damage ), benefits, and alternatives were explained
to the patient. Questions regarding the procedure were encouraged
and answered. The patient understands and consents to the procedure.

Patency of the right basilic vein confirmed by ultrasound and
documented. Overlying skin prepped with chlorhexidine, draped in
usual sterile fashion. Maximal barrier sterile technique was
utilized including caps, mask, sterile gowns, sterile gloves,
sterile drape, hand hygiene and skin antiseptic. Under real-time
ultrasound guidance, the vein was accessed with a 21-gauge
micropuncture needle, exchanged over a 018 guidewire for a
transitional dilator . Dilator flushed and aspirated easily. Dilator
was capped, flushed, and secured.

COMPLICATIONS:
None immediate
FINDINGS: Patent right basilic vein.  Peripheral IV placed.
IMPRESSION: 1. Technically successful right basilic vein peripheral IV placement
under ultrasound. Okay for routine use.

## 2019-03-05 ENCOUNTER — Inpatient Hospital Stay (HOSPITAL_COMMUNITY)
Admission: EM | Admit: 2019-03-05 | Discharge: 2019-03-14 | DRG: 853 | Disposition: A | Discharge status: Home Health | Payer: Medicare Other | Attending: Internal Medicine | Admitting: Internal Medicine

## 2019-03-05 ENCOUNTER — Emergency Department (HOSPITAL_COMMUNITY): Payer: Medicare Other

## 2019-03-05 ENCOUNTER — Encounter (HOSPITAL_COMMUNITY): Payer: Self-pay

## 2019-03-05 ENCOUNTER — Other Ambulatory Visit: Payer: Self-pay

## 2019-03-05 DIAGNOSIS — Z8719 Personal history of other diseases of the digestive system: Secondary | ICD-10-CM | POA: Diagnosis not present

## 2019-03-05 DIAGNOSIS — T83510A Infection and inflammatory reaction due to cystostomy catheter, initial encounter: Secondary | ICD-10-CM | POA: Diagnosis not present

## 2019-03-05 DIAGNOSIS — L89223 Pressure ulcer of left hip, stage 3: Secondary | ICD-10-CM | POA: Diagnosis not present

## 2019-03-05 DIAGNOSIS — L89324 Pressure ulcer of left buttock, stage 4: Secondary | ICD-10-CM | POA: Diagnosis not present

## 2019-03-05 DIAGNOSIS — N390 Urinary tract infection, site not specified: Secondary | ICD-10-CM | POA: Diagnosis not present

## 2019-03-05 DIAGNOSIS — M4628 Osteomyelitis of vertebra, sacral and sacrococcygeal region: Secondary | ICD-10-CM | POA: Diagnosis present

## 2019-03-05 DIAGNOSIS — T07XXXA Unspecified multiple injuries, initial encounter: Secondary | ICD-10-CM | POA: Diagnosis present

## 2019-03-05 DIAGNOSIS — J9610 Chronic respiratory failure, unspecified whether with hypoxia or hypercapnia: Secondary | ICD-10-CM | POA: Diagnosis not present

## 2019-03-05 DIAGNOSIS — N3000 Acute cystitis without hematuria: Secondary | ICD-10-CM | POA: Diagnosis not present

## 2019-03-05 DIAGNOSIS — Y846 Urinary catheterization as the cause of abnormal reaction of the patient, or of later complication, without mention of misadventure at the time of the procedure: Secondary | ICD-10-CM | POA: Diagnosis present

## 2019-03-05 DIAGNOSIS — I959 Hypotension, unspecified: Secondary | ICD-10-CM | POA: Diagnosis not present

## 2019-03-05 DIAGNOSIS — Z9359 Other cystostomy status: Secondary | ICD-10-CM | POA: Diagnosis not present

## 2019-03-05 DIAGNOSIS — Z9049 Acquired absence of other specified parts of digestive tract: Secondary | ICD-10-CM

## 2019-03-05 DIAGNOSIS — G8253 Quadriplegia, C5-C7 complete: Secondary | ICD-10-CM | POA: Diagnosis not present

## 2019-03-05 DIAGNOSIS — Z20828 Contact with and (suspected) exposure to other viral communicable diseases: Secondary | ICD-10-CM | POA: Diagnosis present

## 2019-03-05 DIAGNOSIS — A419 Sepsis, unspecified organism: Secondary | ICD-10-CM

## 2019-03-05 DIAGNOSIS — I9589 Other hypotension: Secondary | ICD-10-CM | POA: Diagnosis present

## 2019-03-05 DIAGNOSIS — M869 Osteomyelitis, unspecified: Secondary | ICD-10-CM

## 2019-03-05 DIAGNOSIS — K3184 Gastroparesis: Secondary | ICD-10-CM | POA: Diagnosis present

## 2019-03-05 DIAGNOSIS — S21201A Unspecified open wound of right back wall of thorax without penetration into thoracic cavity, initial encounter: Secondary | ICD-10-CM | POA: Diagnosis not present

## 2019-03-05 DIAGNOSIS — Z960 Presence of urogenital implants: Secondary | ICD-10-CM | POA: Diagnosis not present

## 2019-03-05 DIAGNOSIS — Z888 Allergy status to other drugs, medicaments and biological substances status: Secondary | ICD-10-CM | POA: Diagnosis not present

## 2019-03-05 DIAGNOSIS — L89890 Pressure ulcer of other site, unstageable: Secondary | ICD-10-CM | POA: Diagnosis not present

## 2019-03-05 DIAGNOSIS — L89154 Pressure ulcer of sacral region, stage 4: Secondary | ICD-10-CM | POA: Diagnosis present

## 2019-03-05 DIAGNOSIS — L89210 Pressure ulcer of right hip, unstageable: Secondary | ICD-10-CM | POA: Diagnosis present

## 2019-03-05 DIAGNOSIS — L8995 Pressure ulcer of unspecified site, unstageable: Secondary | ICD-10-CM | POA: Diagnosis not present

## 2019-03-05 DIAGNOSIS — K449 Diaphragmatic hernia without obstruction or gangrene: Secondary | ICD-10-CM | POA: Diagnosis not present

## 2019-03-05 DIAGNOSIS — E876 Hypokalemia: Secondary | ICD-10-CM | POA: Diagnosis present

## 2019-03-05 DIAGNOSIS — I10 Essential (primary) hypertension: Secondary | ICD-10-CM | POA: Diagnosis not present

## 2019-03-05 DIAGNOSIS — D638 Anemia in other chronic diseases classified elsewhere: Secondary | ICD-10-CM | POA: Diagnosis present

## 2019-03-05 DIAGNOSIS — K219 Gastro-esophageal reflux disease without esophagitis: Secondary | ICD-10-CM | POA: Diagnosis present

## 2019-03-05 DIAGNOSIS — Z9884 Bariatric surgery status: Secondary | ICD-10-CM

## 2019-03-05 DIAGNOSIS — L89314 Pressure ulcer of right buttock, stage 4: Secondary | ICD-10-CM | POA: Diagnosis present

## 2019-03-05 DIAGNOSIS — N319 Neuromuscular dysfunction of bladder, unspecified: Secondary | ICD-10-CM | POA: Diagnosis not present

## 2019-03-05 DIAGNOSIS — L89892 Pressure ulcer of other site, stage 2: Secondary | ICD-10-CM | POA: Diagnosis present

## 2019-03-05 DIAGNOSIS — L89130 Pressure ulcer of right lower back, unstageable: Secondary | ICD-10-CM | POA: Diagnosis not present

## 2019-03-05 DIAGNOSIS — Z8619 Personal history of other infectious and parasitic diseases: Secondary | ICD-10-CM | POA: Diagnosis not present

## 2019-03-05 DIAGNOSIS — Z933 Colostomy status: Secondary | ICD-10-CM | POA: Diagnosis not present

## 2019-03-05 DIAGNOSIS — I739 Peripheral vascular disease, unspecified: Secondary | ICD-10-CM | POA: Diagnosis present

## 2019-03-05 DIAGNOSIS — L89109 Pressure ulcer of unspecified part of back, unspecified stage: Secondary | ICD-10-CM | POA: Diagnosis not present

## 2019-03-05 DIAGNOSIS — L89150 Pressure ulcer of sacral region, unstageable: Secondary | ICD-10-CM | POA: Diagnosis not present

## 2019-03-05 DIAGNOSIS — R5381 Other malaise: Secondary | ICD-10-CM | POA: Diagnosis not present

## 2019-03-05 DIAGNOSIS — L89113 Pressure ulcer of right upper back, stage 3: Secondary | ICD-10-CM | POA: Diagnosis present

## 2019-03-05 DIAGNOSIS — G8220 Paraplegia, unspecified: Secondary | ICD-10-CM | POA: Diagnosis not present

## 2019-03-05 DIAGNOSIS — Z881 Allergy status to other antibiotic agents status: Secondary | ICD-10-CM | POA: Diagnosis not present

## 2019-03-05 DIAGNOSIS — R05 Cough: Secondary | ICD-10-CM | POA: Diagnosis not present

## 2019-03-05 DIAGNOSIS — L89159 Pressure ulcer of sacral region, unspecified stage: Secondary | ICD-10-CM | POA: Diagnosis not present

## 2019-03-05 DIAGNOSIS — L89119 Pressure ulcer of right upper back, unspecified stage: Secondary | ICD-10-CM | POA: Diagnosis not present

## 2019-03-05 DIAGNOSIS — Z8739 Personal history of other diseases of the musculoskeletal system and connective tissue: Secondary | ICD-10-CM | POA: Diagnosis not present

## 2019-03-05 DIAGNOSIS — D509 Iron deficiency anemia, unspecified: Secondary | ICD-10-CM | POA: Diagnosis not present

## 2019-03-05 LAB — CBC WITH DIFFERENTIAL/PLATELET
Abs Immature Granulocytes: 0.12 10*3/uL — ABNORMAL HIGH (ref 0.00–0.07)
Basophils Absolute: 0 10*3/uL (ref 0.0–0.1)
Basophils Relative: 0 %
Eosinophils Absolute: 0 10*3/uL (ref 0.0–0.5)
Eosinophils Relative: 0 %
HCT: 25.1 % — ABNORMAL LOW (ref 39.0–52.0)
Hemoglobin: 7.8 g/dL — ABNORMAL LOW (ref 13.0–17.0)
Immature Granulocytes: 1 %
Lymphocytes Relative: 14 %
Lymphs Abs: 2.1 10*3/uL (ref 0.7–4.0)
MCH: 25 pg — ABNORMAL LOW (ref 26.0–34.0)
MCHC: 31.1 g/dL (ref 30.0–36.0)
MCV: 80.4 fL (ref 80.0–100.0)
Monocytes Absolute: 1.5 10*3/uL — ABNORMAL HIGH (ref 0.1–1.0)
Monocytes Relative: 11 %
Neutro Abs: 10.6 10*3/uL — ABNORMAL HIGH (ref 1.7–7.7)
Neutrophils Relative %: 74 %
Platelets: 470 10*3/uL — ABNORMAL HIGH (ref 150–400)
RBC: 3.12 MIL/uL — ABNORMAL LOW (ref 4.22–5.81)
RDW: 15.9 % — ABNORMAL HIGH (ref 11.5–15.5)
WBC: 14.3 10*3/uL — ABNORMAL HIGH (ref 4.0–10.5)
nRBC: 0 % (ref 0.0–0.2)

## 2019-03-05 LAB — URINALYSIS, ROUTINE W REFLEX MICROSCOPIC
Bilirubin Urine: NEGATIVE
Glucose, UA: NEGATIVE mg/dL
Ketones, ur: 20 mg/dL — AB
Nitrite: NEGATIVE
Protein, ur: 100 mg/dL — AB
Specific Gravity, Urine: 1.017 (ref 1.005–1.030)
pH: 8 (ref 5.0–8.0)

## 2019-03-05 LAB — HEPATIC FUNCTION PANEL
ALT: 11 U/L (ref 0–44)
AST: 14 U/L — ABNORMAL LOW (ref 15–41)
Albumin: 1.3 g/dL — ABNORMAL LOW (ref 3.5–5.0)
Alkaline Phosphatase: 79 U/L (ref 38–126)
Bilirubin, Direct: <0.1 mg/dL (ref 0.0–0.2)
Total Bilirubin: 0.6 mg/dL (ref 0.3–1.2)
Total Protein: 5 g/dL — ABNORMAL LOW (ref 6.5–8.1)

## 2019-03-05 LAB — SARS CORONAVIRUS 2 (TAT 6-24 HRS): SARS Coronavirus 2: NEGATIVE

## 2019-03-05 LAB — BASIC METABOLIC PANEL
Anion gap: 11 (ref 5–15)
BUN: 22 mg/dL — ABNORMAL HIGH (ref 6–20)
CO2: 17 mmol/L — ABNORMAL LOW (ref 22–32)
Calcium: 7.6 mg/dL — ABNORMAL LOW (ref 8.9–10.3)
Chloride: 110 mmol/L (ref 98–111)
Creatinine, Ser: 0.39 mg/dL — ABNORMAL LOW (ref 0.61–1.24)
GFR calc Af Amer: >60 mL/min (ref >60)
GFR calc non Af Amer: >60 mL/min (ref >60)
Glucose, Bld: 75 mg/dL (ref 70–99)
Potassium: 3.8 mmol/L (ref 3.5–5.1)
Sodium: 138 mmol/L (ref 135–145)

## 2019-03-05 LAB — LIPASE, BLOOD: Lipase: 17 U/L (ref 11–51)

## 2019-03-05 LAB — LACTIC ACID, PLASMA: Lactic Acid, Venous: 0.7 mmol/L (ref 0.5–1.9)

## 2019-03-05 MED ORDER — SODIUM CHLORIDE 0.9 % IV BOLUS
1000.0000 mL | Freq: Once | INTRAVENOUS | Status: AC
  Administered 2019-03-05: 1000 mL via INTRAVENOUS

## 2019-03-05 MED ORDER — SIMETHICONE 80 MG PO CHEW
80.0000 mg | CHEWABLE_TABLET | Freq: Four times a day (QID) | ORAL | Status: DC | PRN
  Administered 2019-03-06 – 2019-03-10 (×5): 80 mg via ORAL
  Filled 2019-03-05 (×5): qty 1

## 2019-03-05 MED ORDER — SUCRALFATE 1 G PO TABS
1.0000 g | ORAL_TABLET | Freq: Four times a day (QID) | ORAL | Status: DC
  Administered 2019-03-06 – 2019-03-13 (×31): 1 g via ORAL
  Filled 2019-03-05 (×32): qty 1

## 2019-03-05 MED ORDER — ONDANSETRON 4 MG PO TBDP
8.0000 mg | ORAL_TABLET | Freq: Three times a day (TID) | ORAL | Status: DC | PRN
  Administered 2019-03-06 – 2019-03-09 (×2): 8 mg via ORAL
  Filled 2019-03-05 (×2): qty 2

## 2019-03-05 MED ORDER — FERROUS SULFATE 325 (65 FE) MG PO TABS
325.0000 mg | ORAL_TABLET | Freq: Every day | ORAL | Status: DC
  Administered 2019-03-06 – 2019-03-13 (×8): 325 mg via ORAL
  Filled 2019-03-05 (×8): qty 1

## 2019-03-05 MED ORDER — SODIUM CHLORIDE 0.9 % IV SOLN
1.0000 g | Freq: Three times a day (TID) | INTRAVENOUS | Status: DC
  Administered 2019-03-05 – 2019-03-08 (×10): 1 g via INTRAVENOUS
  Filled 2019-03-05 (×14): qty 1

## 2019-03-05 MED ORDER — VITAMIN C 500 MG PO TABS
500.0000 mg | ORAL_TABLET | Freq: Every day | ORAL | Status: DC
  Administered 2019-03-06 – 2019-03-13 (×8): 500 mg via ORAL
  Filled 2019-03-05 (×8): qty 1

## 2019-03-05 MED ORDER — MAGNESIUM OXIDE 400 (241.3 MG) MG PO TABS
400.0000 mg | ORAL_TABLET | Freq: Every day | ORAL | Status: DC
  Administered 2019-03-06 – 2019-03-13 (×8): 400 mg via ORAL
  Filled 2019-03-05 (×8): qty 1

## 2019-03-05 MED ORDER — BACLOFEN 10 MG PO TABS
20.0000 mg | ORAL_TABLET | Freq: Two times a day (BID) | ORAL | Status: DC
  Administered 2019-03-06 – 2019-03-13 (×16): 20 mg via ORAL
  Filled 2019-03-05 (×4): qty 2
  Filled 2019-03-05: qty 1
  Filled 2019-03-05 (×10): qty 2
  Filled 2019-03-05: qty 1
  Filled 2019-03-05: qty 2

## 2019-03-05 MED ORDER — SODIUM CHLORIDE 0.9 % IV BOLUS
250.0000 mL | Freq: Once | INTRAVENOUS | Status: AC
  Administered 2019-03-05: 250 mL via INTRAVENOUS

## 2019-03-05 MED ORDER — PANTOPRAZOLE SODIUM 40 MG PO TBEC
40.0000 mg | DELAYED_RELEASE_TABLET | Freq: Two times a day (BID) | ORAL | Status: DC
  Administered 2019-03-06 – 2019-03-13 (×16): 40 mg via ORAL
  Filled 2019-03-05 (×17): qty 1

## 2019-03-05 MED ORDER — ALBUTEROL SULFATE (2.5 MG/3ML) 0.083% IN NEBU: 3.0000 mL | INHALATION_SOLUTION | Freq: Four times a day (QID) | RESPIRATORY_TRACT | Status: DC | PRN

## 2019-03-05 MED ORDER — ADULT MULTIVITAMIN W/MINERALS CH
1.0000 | ORAL_TABLET | Freq: Every morning | ORAL | Status: DC
  Administered 2019-03-06 – 2019-03-13 (×8): 1 via ORAL
  Filled 2019-03-05 (×8): qty 1

## 2019-03-05 MED ORDER — ACETAMINOPHEN 650 MG RE SUPP: 650.0000 mg | Freq: Four times a day (QID) | RECTAL | Status: DC | PRN

## 2019-03-05 MED ORDER — ACETAMINOPHEN 325 MG PO TABS
650.0000 mg | ORAL_TABLET | Freq: Four times a day (QID) | ORAL | Status: DC | PRN
  Administered 2019-03-09 – 2019-03-10 (×2): 650 mg via ORAL
  Filled 2019-03-05 (×2): qty 2

## 2019-03-05 MED ORDER — METOCLOPRAMIDE HCL 10 MG PO TABS
10.0000 mg | ORAL_TABLET | Freq: Three times a day (TID) | ORAL | Status: DC
  Administered 2019-03-06 – 2019-03-13 (×23): 10 mg via ORAL
  Filled 2019-03-05 (×23): qty 1

## 2019-03-05 MED ORDER — IOHEXOL 300 MG/ML  SOLN
100.0000 mL | Freq: Once | INTRAMUSCULAR | Status: AC | PRN
  Administered 2019-03-05: 100 mL via INTRAVENOUS

## 2019-03-05 MED ORDER — ZINC SULFATE 220 (50 ZN) MG PO CAPS
220.0000 mg | ORAL_CAPSULE | Freq: Two times a day (BID) | ORAL | Status: DC
  Administered 2019-03-06 – 2019-03-13 (×16): 220 mg via ORAL
  Filled 2019-03-05 (×17): qty 1

## 2019-03-05 MED ORDER — VANCOMYCIN HCL 10 G IV SOLR
2000.0000 mg | Freq: Once | INTRAVENOUS | Status: AC
  Administered 2019-03-05: 2000 mg via INTRAVENOUS
  Filled 2019-03-05: qty 2000

## 2019-03-05 MED ORDER — VANCOMYCIN HCL 10 G IV SOLR
1500.0000 mg | INTRAVENOUS | Status: DC
  Administered 2019-03-06 – 2019-03-07 (×2): 1500 mg via INTRAVENOUS
  Filled 2019-03-05 (×3): qty 1500

## 2019-03-05 MED ORDER — FESOTERODINE FUMARATE ER 8 MG PO TB24
8.0000 mg | ORAL_TABLET | Freq: Every morning | ORAL | Status: DC
  Administered 2019-03-06 – 2019-03-13 (×8): 8 mg via ORAL
  Filled 2019-03-05 (×8): qty 1

## 2019-03-05 MED ORDER — POTASSIUM CHLORIDE CRYS ER 20 MEQ PO TBCR
40.0000 meq | EXTENDED_RELEASE_TABLET | Freq: Every day | ORAL | Status: DC
  Administered 2019-03-06 – 2019-03-13 (×8): 40 meq via ORAL
  Filled 2019-03-05 (×8): qty 2
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
## 2019-03-05 NOTE — Progress Notes (Signed)
Pharmacy Antibiotic Note  [name] [name] is a 52 y.o. male  with PMH of quadriplegia, chronic suprapubic foley cathether with history of UTI, who presents with symptoms concerning for another UTI. Furthermore, the patient has wound infections on his lower right back that has purulent draining, foul smell, which has occurring for the last few days. Pt follows with wound care. He has a history of ESBL E. Coli. Pharmacy has been consulted for Meropenem and Vancomycin dosing for wound coverage.   Pt is Quadriplegic, so creatinine not expected to correspond to drug clearance. Prior AKI due to Vancomycin (2018). Scr (0.39) is slightly elevated from baseline of <0.3.  Plan: Vancomycin 2000 mg IV once as loading dose  Vancomycin 1500 IV every 24 hours. Goal trough 10-15 mcg/mL Will dose vancomycin conservatively due to previous AKI from vancomycin Meropenem IV 1g q8 hours Monitor renal function, WBC, clinical status, and temp  Weight: 197 lb 5 oz (89.5 kg)  Temp (24hrs), Avg:98.5 F (36.9 C), Min:98.5 F (36.9 C), Max:98.5 F (36.9 C)  No results for input(s): WBC, CREATININE, LATICACIDVEN, VANCOTROUGH, VANCOPEAK, VANCORANDOM, GENTTROUGH, GENTPEAK, GENTRANDOM, TOBRATROUGH, TOBRAPEAK, TOBRARND, AMIKACINPEAK, AMIKACINTROU, AMIKACIN in the last 168 hours.  CrCl cannot be calculated (Patient's most recent lab result is older than the maximum 21 days allowed.).    Allergies  Allergen Reactions  . Ferumoxytol Anaphylaxis and Other (See Comments)    (Feraheme) = "SYNCOPE; patient tolerated Venofer 10/01/18 s rxn"   . Oxybutynin Other (See Comments)    Hallucinations    . Vancomycin Other (See Comments)    ARF 05-2016 -- affects kidneys     Antimicrobials this admission: Vancomycin 10/20 >>  Meropenem 10/20 >>   Microbiology results: 10/20 BCx: Sent 10/20 UCx: Sent 10/20 COVID: Sent  Thank you for allowing pharmacy to be a part of this patient's care.  Sherren Kerns, PharmD PGY1 Acute  Care Pharmacy Resident 03/05/2019 5:41 PM

## 2019-03-05 NOTE — ED Notes (Signed)
X-ray at bedside

## 2019-03-05 NOTE — ED Notes (Signed)
Paged Dr. Baltazar Najjar regarding this pt and his low bp.
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
## 2019-03-05 NOTE — ED Notes (Signed)
Patient transported to CT 

## 2019-03-05 NOTE — ED Triage Notes (Addendum)
Pt from home via ems; pt concerned about would on back, pt states it is turning black; also c/o urinary symptoms x 2 weeks; finished round abx, states symptoms worse; foul smelling urine, dark, cloudy; suprapubic catheter in place; pt also endorses decreased appetite since Sunday  108/80 HR 113 98% RA CBG 98 97.54F  RR 18-22
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
## 2019-03-05 NOTE — ED Notes (Signed)
Spoke with Dr Posey Pronto, he says the pt has a hx of hypotension and that if he has a map of 60 he is appropriate for a medsurg bed.

## 2019-03-06 ENCOUNTER — Encounter (HOSPITAL_COMMUNITY): Payer: Self-pay | Admitting: General Practice

## 2019-03-06 DIAGNOSIS — M869 Osteomyelitis, unspecified: Secondary | ICD-10-CM | POA: Diagnosis not present

## 2019-03-06 DIAGNOSIS — N3000 Acute cystitis without hematuria: Secondary | ICD-10-CM

## 2019-03-06 DIAGNOSIS — A419 Sepsis, unspecified organism: Secondary | ICD-10-CM

## 2019-03-06 DIAGNOSIS — D638 Anemia in other chronic diseases classified elsewhere: Secondary | ICD-10-CM

## 2019-03-06 HISTORY — DX: Sepsis, unspecified organism: A41.9

## 2019-03-06 LAB — BASIC METABOLIC PANEL
Anion gap: 8 (ref 5–15)
BUN: 15 mg/dL (ref 6–20)
CO2: 19 mmol/L — ABNORMAL LOW (ref 22–32)
Calcium: 7.5 mg/dL — ABNORMAL LOW (ref 8.9–10.3)
Chloride: 113 mmol/L — ABNORMAL HIGH (ref 98–111)
Creatinine, Ser: 0.38 mg/dL — ABNORMAL LOW (ref 0.61–1.24)
GFR calc Af Amer: >60 mL/min (ref >60)
GFR calc non Af Amer: >60 mL/min (ref >60)
Glucose, Bld: 105 mg/dL — ABNORMAL HIGH (ref 70–99)
Potassium: 3 mmol/L — ABNORMAL LOW (ref 3.5–5.1)
Sodium: 140 mmol/L (ref 135–145)

## 2019-03-06 LAB — CBC
HCT: 26.4 % — ABNORMAL LOW (ref 39.0–52.0)
Hemoglobin: 7.8 g/dL — ABNORMAL LOW (ref 13.0–17.0)
MCH: 24.5 pg — ABNORMAL LOW (ref 26.0–34.0)
MCHC: 29.5 g/dL — ABNORMAL LOW (ref 30.0–36.0)
MCV: 82.8 fL (ref 80.0–100.0)
Platelets: 502 10*3/uL — ABNORMAL HIGH (ref 150–400)
RBC: 3.19 MIL/uL — ABNORMAL LOW (ref 4.22–5.81)
RDW: 16 % — ABNORMAL HIGH (ref 11.5–15.5)
WBC: 14 10*3/uL — ABNORMAL HIGH (ref 4.0–10.5)
nRBC: 0 % (ref 0.0–0.2)

## 2019-03-06 LAB — URINE CULTURE

## 2019-03-06 LAB — LACTIC ACID, PLASMA: Lactic Acid, Venous: 0.8 mmol/L (ref 0.5–1.9)

## 2019-03-06 MED ORDER — CHLORHEXIDINE GLUCONATE CLOTH 2 % EX PADS
6.0000 | MEDICATED_PAD | Freq: Every day | CUTANEOUS | Status: DC
  Administered 2019-03-06 – 2019-03-13 (×6): 6 via TOPICAL

## 2019-03-06 MED ORDER — COLLAGENASE 250 UNIT/GM EX OINT
TOPICAL_OINTMENT | Freq: Every day | CUTANEOUS | Status: DC
  Administered 2019-03-06 – 2019-03-13 (×7): via TOPICAL
  Filled 2019-03-06 (×2): qty 30

## 2019-03-06 MED ORDER — POTASSIUM CHLORIDE CRYS ER 20 MEQ PO TBCR
40.0000 meq | EXTENDED_RELEASE_TABLET | Freq: Once | ORAL | Status: AC
  Administered 2019-03-06: 18:00:00 40 meq via ORAL
  Filled 2019-03-06: qty 2

## 2019-03-06 NOTE — Consult Note (Signed)
[facility] Nurse wound consult note Reason for Consult: multiple wounds Patient well known to our service.  Last seen by [facility] in September. Discussed patient with surgery service this am because based on images of the right hip/flank that it would be outside of the [facility] nurse scope of practice to "open up wounds"  Wound type: 1. Left hip pressure injury; Stage 3 2. Sacrum/buttock pressure injuries; Stage  3. Right hip pressure injury; Unstageable  4. Right anterior thigh; Unstageable   Pressure Injury POA: Yes Measurement: will ask staff to complete with RN 2 person skin assessment.  CCS was just in to turn patient for wound assessment  Wound bed: see CCS note 03/06/19 Drainage (amount, consistency, odor) see nursing flow sheet.  Patient has chronic non healing wounds with chronic odor  Periwound: intact  Dressing procedure/placement/frequency: 1. PT hydrotherapy added for hip and flank wound, contacted PT department to notify patient being held in ED. 2. Added enzymatic debridement ointment for hip and flank wounds, with saline moist gauze daily.  3. Low air loss mattress arrived for patient while I was in the ED for moisture management and pressure redistribution 4. Off load heels with pillows  5. Silver hydrofiber for the chronic wounds on the sacrum, change daily.  PT to communicate with [facility] about needs related to hydrotherapy   Discussed POC with patient and bedside nurse.  Re consult if needed, will not follow at this time. Thanks  Elanore Talcott [employer], RN,CWOCN, CNS, [place] [phone])

## 2019-03-06 NOTE — ED Notes (Signed)
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 03/06/19 0926)  pantoprazole (PROTONIX) EC tablet 40 mg (40 mg Oral Given 03/06/19 0926)  potassium chloride SA (KLOR-CON) CR tablet 40 mEq (40 mEq Oral Given 03/06/19 0926)  simethicone (MYLICON) chewable tablet 80 mg (has no administration in time range)  sucralfate (CARAFATE) tablet 1 g (1 g Oral Given 03/06/19 0926)  vitamin C (ASCORBIC ACID) tablet 500 mg (500 mg Oral Given 03/06/19 0926)  zinc sulfate capsule 220 mg (220 mg Oral Given 03/06/19 0148)  collagenase (SANTYL) ointment (has no administration in time range)  sodium chloride 0.9 % bolus 1,000 mL (0 mLs Intravenous Stopped 03/05/19 2030)  vancomycin (VANCOCIN) 2,000 mg in sodium chloride 0.9 % 500 mL IVPB (0 mg Intravenous Stopped 03/05/19 2136)  iohexol (OMNIPAQUE) 300  MG/ML solution 100 mL (100 mLs Intravenous Contrast Given 03/05/19 1952)  sodium chloride 0.9 % bolus 1,000 mL (0 mLs Intravenous Stopped 03/05/19 2204)  sodium chloride 0.9 % bolus 250 mL (0 mLs Intravenous Stopped 03/06/19 0031)    Mobility manual wheelchair Moderate fall risk   Focused Assessments skin   R Recommendations: See Admitting Provider Note  Report given to:   Additional Notes:

## 2019-03-06 NOTE — ED Notes (Signed)
MS   Breakfast ordered  
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
## 2019-03-06 NOTE — Progress Notes (Signed)
MD aware of patient's BP. Will continue to monitor.

## 2019-03-06 NOTE — Progress Notes (Signed)
Patient ID: [name] [name], male   DOB: 1966/01/15, 53 y.o.   MRN: [id number]       Subjective: [name].  Objective: Vital signs in last 24 hours: Temp:  [98.5 F (36.9 C)] 98.5 F (36.9 C) (10/20 1605) Pulse Rate:  [58-122] 112 (10/21 0530) Resp:  [13-23] 17 (10/21 0530) BP: (80-121)/(58-81) 101/71 (10/21 0530) SpO2:  [100 %] 100 % (10/21 0530) Weight:  [89.5 kg] 89.5 kg (10/20 1600)    Intake/Output from previous day: 10/20 0701 - 10/21 0700 In: 3092 [IV Piggyback:3092] Out: 1100 [Urine:1100] Intake/Output this shift: No intake/output data recorded.  PE: Skin: extensive wounds throughout left and right hips as well as his entire buttocks/sacral area.  Right hip wounds with necrotic eschar present.  The more superior wound I cut the eschar off.  There is still some necrotic fibrin in the lateral area of this wound, but the remaining wound is fairly clean overall.  The inferior right hip wound had some eschar debrided as well, but quickly got to bleeding tissue.  This eschar is thin and somewhat tight on health tissue below.  No further sharp debridement done here.  The remaining wounds are overall clean.  Lab Results:  Recent Labs    03/05/19 1752 03/06/19 0339  WBC 14.3* 14.0*  HGB 7.8* 7.8*  HCT 25.1* 26.4*  PLT 470* 502*   BMET Recent Labs    03/05/19 1752 03/06/19 0339  NA 138 140  K 3.8 3.0*  CL 110 113*  CO2 17* 19*  GLUCOSE 75 105*  BUN 22* 15  CREATININE 0.39* 0.38*  CALCIUM 7.6* 7.5*   PT/INR No results for input(s): LABPROT, INR in the last 72 hours. CMP     Component Value Date/Time   NA 140 03/06/2019 0339   NA 139 11/10/2016   NA 137 10/07/2014 1148   K 3.0 (L) 03/06/2019 0339   K 3.7 10/07/2014 1148   CL 113 (H) 03/06/2019 0339   CO2 19 (L) 03/06/2019 0339   CO2 22 10/07/2014 1148   GLUCOSE 105 (H) 03/06/2019 0339   GLUCOSE 117 10/07/2014 1148   BUN 15 03/06/2019 0339   BUN 10 11/10/2016   BUN 8.7 10/07/2014 1148   CREATININE 0.38 (L) 03/06/2019 0339   CREATININE 0.57 (L) 07/01/2015 1606   CREATININE 0.6 (L) 10/07/2014 1148   CALCIUM 7.5 (L) 03/06/2019 0339   CALCIUM 9.1 10/07/2014 1148   PROT 5.0 (L) 03/05/2019 1752   PROT 8.0 10/07/2014 1148   ALBUMIN 1.3 (L) 03/05/2019 1752   ALBUMIN 2.8 (L) 10/07/2014 1148   AST 14 (L) 03/05/2019 1752   AST 16 10/07/2014 1148   ALT 11 03/05/2019 1752   ALT 7 10/07/2014 1148   ALKPHOS 79 03/05/2019 1752   ALKPHOS 80 10/07/2014 1148   BILITOT 0.6 03/05/2019 1752   BILITOT <0.20 10/07/2014 1148   GFRNONAA >60 03/06/2019 0339   GFRAA >60 03/06/2019 0339   Lipase     Component Value Date/Time   LIPASE 17 03/05/2019 1752       Studies/Results: Ct Abdomen Pelvis W Contrast  Result Date: 03/05/2019 CLINICAL DATA:  Abdominal distension EXAM: CT ABDOMEN AND PELVIS WITH CONTRAST TECHNIQUE: Multidetector CT imaging of the abdomen and pelvis was performed using the standard protocol following bolus administration of intravenous contrast. CONTRAST:  117mL OMNIPAQUE IOHEXOL 300 MG/ML  SOLN COMPARISON:  CT 01/22/2019 FINDINGS: Lower chest: There is abundant subpleural fat bilaterally. Small collections within the subpleural fat of the right  lung base appear trace to regions of posterior skin thickening (3/16). Adjacent areas of atelectasis are noted. Hepatobiliary: No focal liver abnormality is seen. No gallstones, gallbladder wall thickening, or biliary dilatation. Pancreas: Unremarkable. No pancreatic ductal dilatation or surrounding inflammatory changes. Spleen: Normal in size without focal abnormality. Adrenals/Urinary Tract: Normal adrenal glands. Stable appearance of the bilateral perinephric stranding and retroperitoneal haze centered upon both kidneys, nonspecific. Kidneys are otherwise unremarkable, without renal calculi, suspicious lesion, or hydronephrosis. Bladder is unremarkable. Circumferential bladder wall thickening decompressed about an inflated suprapubic  catheter. Stomach/Bowel: Small hiatal hernia. 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 Mild gastric wall thickening and mucosal hyperemia, nonspecific. No CT evidence of anastomotic stricture ring. Multiple fluid-filled loops of small bowel without abnormal wall thickening or enteric inflammation. 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 No colonic dilatation or wall thickening. Vascular/Lymphatic: Atherosclerotic plaque within the normal caliber aorta. Reactive appearing lymph nodes in the groin and mesentery. Reproductive: The prostate and seminal vesicles are unremarkable. Other: There is circumferential body wall edema. Focal areas skin thickening is seen in the right flank with subcutaneous extension to the pleural space extending through the chest wall there is a questionable rim enhancing collection in the subpleural fat measuring 8 mm x 20 mm in size. Extensive soft tissue thickening along the groin. Musculoskeletal: Additional ulceration is noted along the right anterolateral flank extends to the left tenth and eleventh anterolateral costochondral junctions with associated sclerotic change. Additional posterior right flank ulceration with soft tissue gas extending to the tip of the right twelfth rib with destructive osteomyelitic changes which are new from comparison exam. There is extensive bilateral sacral decubitus ulcers with sclerotic changes of the iliac wings and sacrum. Increasing thickening and osteomyelitic changes of the right hip and ulceration along the lateral left hip with destructive changes as well. Multilevel degenerative changes are present in the imaged portions of the spine. Long segmental fusion of right articular processes the thoracolumbar spine. IMPRESSION: 1. 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 2. Mild gastric wall thickening and mucosal hyperemia, nonspecific, but can be seen with gastritis. 3.  Extensive bilateral sacral decubitus ulcers sclerotic osseous features compatible with osteomyelitis. 4. Cutaneous ulceration and phlegmon extends to the right twelfth rib with destructive osseous changes compatible osteomyelitis. 5. Additional ulceration and phlegmonous change of the right flank extends into a collection of subpleural fat in the right lung base with a 8 x 20 mm rim enhancing collection suspicious for abscess and a smaller more phlegmonous change seen laterally in the same pack collection. 6. Soft tissue ulceration, gas and fluid extending to both hips with features of severe chronic osteomyelitis. 7. Circumferential bladder wall thickening decompressed about an inflated suprapubic catheter. Correlate with urinalysis to exclude cystitis. 8. Aortic Atherosclerosis (ICD10-I70.0). These results were called by telephone at the time of interpretation on 03/05/2019 at 8:23 pm to provider ADAM CURATOLO , who verbally acknowledged these results. Electronically Signed   By: Lovena Le M.D.   On: 03/05/2019 20:24   Dg Chest Portable 1 View  Result Date: 03/05/2019 CLINICAL DATA:  Cough EXAM: PORTABLE CHEST 1 VIEW COMPARISON:  01/03/2019 FINDINGS: Right IJ Port-A-Cath remains in place, tip in caval to atrial junction. Cardiomediastinal contours are stable accounting for portable technique and depth of inspiration. Lungs are clear. Previous changes of prior trauma or thoracotomy associated with left ribs are again noted. IMPRESSION: 1. No interval change in the appearance of the chest. No acute  abnormalities. 2. Stable right IJ Port-A-Cath. Electronically Signed   By: Zetta Bills M.D.   On: 03/05/2019 17:26    Anti-infectives: Anti-infectives (From admission, onward)   Start     Dose/Rate Route Frequency Ordered Stop   03/06/19 1745  vancomycin (VANCOCIN) 1,500 mg in sodium chloride 0.9 % 500 mL IVPB     1,500 mg 250 mL/hr over 120 Minutes Intravenous Every 24 hours 03/05/19 1740     03/05/19  1745  vancomycin (VANCOCIN) 2,000 mg in sodium chloride 0.9 % 500 mL IVPB     2,000 mg 250 mL/hr over 120 Minutes Intravenous  Once 03/05/19 1740 03/05/19 2136   03/05/19 1745  meropenem (MERREM) 1 g in sodium chloride 0.9 % 100 mL IVPB     1 g 200 mL/hr over 30 Minutes Intravenous Every 8 hours 03/05/19 1740         Assessment/Plan  multiple hip and buttock/back wounds -some sharp debridement done to the right hip wounds.  These need some extra conservative therapies such as santyl, hydrotherapy, and BID dressing changes.  This has been ordered. -remaining wounds can be assessed by WOC.  He has been followed by [name] with plastics in the past and could consider consultation if felt needed as inpatient vs outpatient follow up as these are not the reason he was admitted.  Defer to primary service. -no further surgical needs at this time.  We will defer further care to primary service and ancillary services.  Please call if needed.    LOS: 1 day    Henreitta Cea , [facility] Surgery 03/06/2019, 9:00 AM Please see Amion for pager number during day hours 7:00am-4:30pm

## 2019-03-06 NOTE — ED Notes (Signed)
Feed patient his breakfast patient is resting with call bell in reach

## 2019-03-06 NOTE — ED Notes (Signed)
Called to give report. Nurse unavailable. Will call back shortly. 

## 2019-03-06 NOTE — Progress Notes (Signed)
PT Cancellation Note  Patient Details Name: [name] [name] MRN: [id number] DOB: May 02, 1966   Cancelled Treatment:    Reason Eval/Treat Not Completed: Other (comment). Hydrotherapy order received. Pt underwent bedside debridement this AM and is currently in the ED. Will initiate hydrotherapy tomorrow.   Shary Decamp [facility] 03/06/2019, 11:09 AM  [place] Pager [phone] Office [phone]

## 2019-03-06 NOTE — ED Notes (Signed)
Pt placed on hospital bed

## 2019-03-07 ENCOUNTER — Encounter (HOSPITAL_BASED_OUTPATIENT_CLINIC_OR_DEPARTMENT_OTHER): Payer: Medicare Other | Admitting: Internal Medicine

## 2019-03-07 ENCOUNTER — Inpatient Hospital Stay (HOSPITAL_COMMUNITY): Payer: Medicare Other

## 2019-03-07 ENCOUNTER — Encounter (HOSPITAL_COMMUNITY): Payer: Self-pay | Admitting: Interventional Radiology

## 2019-03-07 DIAGNOSIS — N3000 Acute cystitis without hematuria: Secondary | ICD-10-CM | POA: Diagnosis not present

## 2019-03-07 DIAGNOSIS — D638 Anemia in other chronic diseases classified elsewhere: Secondary | ICD-10-CM | POA: Diagnosis not present

## 2019-03-07 DIAGNOSIS — M869 Osteomyelitis, unspecified: Secondary | ICD-10-CM | POA: Diagnosis not present

## 2019-03-07 HISTORY — PX: IR CATHETER TUBE CHANGE: IMG717

## 2019-03-07 LAB — BASIC METABOLIC PANEL WITH GFR
Anion gap: 6 (ref 5–15)
BUN: 7 mg/dL (ref 6–20)
CO2: 21 mmol/L — ABNORMAL LOW (ref 22–32)
Calcium: 7.2 mg/dL — ABNORMAL LOW (ref 8.9–10.3)
Chloride: 112 mmol/L — ABNORMAL HIGH (ref 98–111)
Creatinine, Ser: <0.3 mg/dL — ABNORMAL LOW (ref 0.61–1.24)
Glucose, Bld: 109 mg/dL — ABNORMAL HIGH (ref 70–99)
Potassium: 3.4 mmol/L — ABNORMAL LOW (ref 3.5–5.1)
Sodium: 139 mmol/L (ref 135–145)

## 2019-03-07 LAB — CBC
HCT: 24.4 % — ABNORMAL LOW (ref 39.0–52.0)
Hemoglobin: 7.4 g/dL — ABNORMAL LOW (ref 13.0–17.0)
MCH: 24.3 pg — ABNORMAL LOW (ref 26.0–34.0)
MCHC: 30.3 g/dL (ref 30.0–36.0)
MCV: 80 fL (ref 80.0–100.0)
Platelets: 474 K/uL — ABNORMAL HIGH (ref 150–400)
RBC: 3.05 MIL/uL — ABNORMAL LOW (ref 4.22–5.81)
RDW: 16 % — ABNORMAL HIGH (ref 11.5–15.5)
WBC: 11.1 K/uL — ABNORMAL HIGH (ref 4.0–10.5)
nRBC: 0 % (ref 0.0–0.2)

## 2019-03-07 MED ORDER — ALUM & MAG HYDROXIDE-SIMETH 200-200-20 MG/5ML PO SUSP
15.0000 mL | Freq: Four times a day (QID) | ORAL | Status: DC | PRN
  Administered 2019-03-07: 14:00:00 15 mL via ORAL
  Filled 2019-03-07: qty 30

## 2019-03-07 MED ORDER — FAMOTIDINE IN NACL 20-0.9 MG/50ML-% IV SOLN
20.0000 mg | Freq: Once | INTRAVENOUS | Status: AC
  Administered 2019-03-07: 22:00:00 20 mg via INTRAVENOUS
  Filled 2019-03-07: qty 50

## 2019-03-07 MED ORDER — PROMETHAZINE HCL 25 MG/ML IJ SOLN
12.5000 mg | Freq: Four times a day (QID) | INTRAMUSCULAR | Status: DC | PRN
  Administered 2019-03-07 – 2019-03-11 (×3): 12.5 mg via INTRAVENOUS
  Filled 2019-03-07 (×3): qty 1

## 2019-03-07 MED ORDER — ALTEPLASE 2 MG IJ SOLR
2.0000 mg | Freq: Once | INTRAMUSCULAR | Status: AC
  Administered 2019-03-07: 10:00:00 2 mg
  Filled 2019-03-07: qty 2

## 2019-03-07 MED ORDER — SODIUM CHLORIDE 0.9% FLUSH
10.0000 mL | Freq: Two times a day (BID) | INTRAVENOUS | Status: DC
  Administered 2019-03-07 – 2019-03-13 (×4): 10 mL

## 2019-03-07 MED ORDER — IOHEXOL 300 MG/ML  SOLN
50.0000 mL | Freq: Once | INTRAMUSCULAR | Status: AC | PRN
  Administered 2019-03-07: 16:00:00 10 mL

## 2019-03-07 MED ORDER — MENTHOL 3 MG MT LOZG
1.0000 | LOZENGE | OROMUCOSAL | Status: DC | PRN
  Filled 2019-03-07 (×2): qty 9

## 2019-03-07 MED ORDER — SODIUM CHLORIDE 0.9% FLUSH
10.0000 mL | INTRAVENOUS | Status: DC | PRN
  Administered 2019-03-13: 19:00:00 10 mL
  Filled 2019-03-07: qty 40

## 2019-03-07 NOTE — Progress Notes (Signed)
Physical Therapy Wound Treatment Patient Details  Name: [name] [name] MRN: [id number] Date of Birth: 1965-11-12  Today's Date: 03/07/2019 Time: 6962-9528 Time Calculation (min): 70 min  Subjective  Subjective: Pt reports he remember pulsatile lavage from prior stay at [facility] Patient and Family Stated Goals: not stated Date of Onset: (long term, chronic) Prior Treatments: dressing changes, bedside debridement  Pain Score:  rt shoulder with positioning. Repositioned.   Wound Assessment     Pressure Injury 03/06/19 Flank Right;Lower pink, yellow, slough (Active)  Wound Image   03/07/19 1100  Dressing Type ABD;Gauze (Comment);Moist to dry 03/07/19 1100  Dressing Changed;Clean;Dry;Intact 03/07/19 1100  Dressing Change Frequency Daily 03/07/19 1100  State of Healing Eschar 03/07/19 1100  Site / Wound Assessment Yellow;Brown;Black;Red;Pink 03/07/19 1100  % Wound base Red or Granulating 70% 03/07/19 1100  % Wound base Yellow/Fibrinous Exudate 15% 03/07/19 1100  % Wound base Black/Eschar 15% 03/07/19 1100  % Wound base Other/Granulation Tissue (Comment) 0% 03/07/19 1100  Peri-wound Assessment Pink 03/07/19 1100  Wound Length (cm) 7 cm 03/07/19 1100  Wound Width (cm) 6 cm 03/07/19 1100  Wound Depth (cm) 3 cm 03/07/19 1100  Wound Surface Area (cm^2) 42 cm^2 03/07/19 1100  Wound Volume (cm^3) 126 cm^3 03/07/19 1100  Tunneling (cm) 5 cm tunnel at 10 oclock 03/07/19 1100  Undermining (cm) 11 to 1 oclock 1-2 cm 03/07/19 1100  Margins Unattached edges (unapproximated) 03/07/19 1100  Drainage Amount Moderate 03/07/19 1100  Drainage Description Purulent 03/07/19 1100  Treatment Debridement (Selective);Hydrotherapy (Pulse lavage);Packing (Saline gauze) 03/07/19 1100   Santyl applied to wound bed prior to applying dressing.   Pressure Injury 03/06/19 Back Lateral;Right;Upper Stage III -  Full thickness tissue loss. Subcutaneous fat may be visible but bone, tendon or muscle are NOT exposed. Pink,  Red (Active)  Wound Image   03/07/19 1100  Dressing Type ABD;Gauze (Comment);Moist to dry;Barrier Film (skin prep) 03/07/19 1100  Dressing Changed;Clean;Dry;Intact 03/07/19 1100  Dressing Change Frequency Daily 03/07/19 1100  State of Healing Early/partial granulation 03/07/19 1100  Site / Wound Assessment Yellow;Red;Pink 03/07/19 1100  % Wound base Red or Granulating 90% 03/07/19 1100  % Wound base Yellow/Fibrinous Exudate 10% 03/07/19 1100  % Wound base Black/Eschar 0% 03/07/19 1100  % Wound base Other/Granulation Tissue (Comment) 0% 03/07/19 1100  Peri-wound Assessment Pink 03/07/19 1100  Wound Length (cm) 11.5 cm 03/07/19 1100  Wound Width (cm) 5 cm 03/07/19 1100  Wound Depth (cm) 0.1 cm 03/07/19 1100  Wound Surface Area (cm^2) 57.5 cm^2 03/07/19 1100  Wound Volume (cm^3) 5.75 cm^3 03/07/19 1100  Margins Unattached edges (unapproximated) 03/07/19 1100  Drainage Amount Minimal 03/07/19 1100  Drainage Description Sanguineous 03/07/19 1100  Treatment Hydrotherapy (Pulse lavage);Packing (Saline gauze) 03/07/19 1100   Santyl applied to wound bed prior to applying dressing.   Pressure Injury 03/06/19 Hip Right;Lateral Unstageable - Full thickness tissue loss in which the base of the ulcer is covered by slough (yellow, tan, gray, green or brown) and/or eschar (tan, brown or black) in the wound bed. Yellow, tan, slough (Active)  Wound Image   03/07/19 1100  Dressing Type ABD;Barrier Film (skin prep);Gauze (Comment);Moist to dry 03/07/19 1100  Dressing Changed;Clean;Dry;Intact 03/07/19 1100  Dressing Change Frequency Daily 03/07/19 1100  State of Healing Eschar 03/07/19 1100  Site / Wound Assessment Yellow;Red;Pink;Black;Brown 03/07/19 1100  % Wound base Red or Granulating 10% 03/07/19 1100  % Wound base Yellow/Fibrinous Exudate 60% 03/07/19 1100  % Wound base Black/Eschar 30% 03/07/19 1100  % Wound base  Other/Granulation Tissue (Comment) 0% 03/07/19 1100  Peri-wound Assessment Pink  03/07/19 1100  Wound Length (cm) 5 cm 03/07/19 1100  Wound Width (cm) 7 cm 03/07/19 1100  Wound Depth (cm) 0.1 cm 03/07/19 1100  Wound Surface Area (cm^2) 35 cm^2 03/07/19 1100  Wound Volume (cm^3) 3.5 cm^3 03/07/19 1100  Margins Unattached edges (unapproximated) 03/07/19 1100  Drainage Amount Moderate 03/07/19 1100  Drainage Description Purulent 03/07/19 1100  Treatment Debridement (Selective);Hydrotherapy (Pulse lavage);Packing (Saline gauze) 03/07/19 1100   Santyl applied to wound bed prior to applying dressing.    Hydrotherapy Pulsed lavage therapy - wound location: Rt upper and lower flank and rt lateral hip Pulsed Lavage with Suction (psi): 8 psi(4-8) Pulsed Lavage with Suction - Normal Saline Used: 1000 mL Pulsed Lavage Tip: Tip with splash shield Selective Debridement Selective Debridement - Location: rt lower flank and rt lateral hip Selective Debridement - Tools Used: Forceps;Scissors Selective Debridement - Tissue Removed: brown, black, yellow necrotic tissue   Wound Assessment and Plan  Wound Therapy - Assess/Plan/Recommendations Wound Therapy - Clinical Statement: Pt presents with multiple extensive chronic wounds. Pt can benefit from hydrotherapy for removal of necrotic tissue. Found 5 cm tunnel in lower rt flank wound that extends 5 cm and draining purulent fluid.  Wound Therapy - Functional Problem List: Limited options for positioning due to multiple pressure areas Factors Delaying/Impairing Wound Healing: Infection - systemic/local;Immobility;Multiple medical problems;Polypharmacy;Altered sensation Hydrotherapy Plan: Debridement;Dressing change;Patient/family education;Pulsatile lavage with suction Wound Therapy - Frequency: 6X / week Wound Therapy - Follow Up Recommendations: Home health RN Wound Plan: See above  Wound Therapy Goals- Improve the function of patient's integumentary system by progressing the wound(s) through the phases of wound healing (inflammation  - proliferation - remodeling) by: Decrease Necrotic Tissue to: 25/10(rt hip/lower rt flank) Decrease Necrotic Tissue - Progress: Goal set today Increase Granulation Tissue to: 75/90(rt hip/lower rt flank) Increase Granulation Tissue - Progress: Goal set today Goals/treatment plan/discharge plan were made with and agreed upon by patient/family: Yes Time For Goal Achievement: 7 days Wound Therapy - Potential for Goals: Fair  Goals will be updated until maximal potential achieved or discharge criteria met.  Discharge criteria: when goals achieved, discharge from hospital, MD decision/surgical intervention, no progress towards goals, refusal/missing three consecutive treatments without notification or medical reason.  GP     Shary Decamp [place] 03/07/2019, 1:52 PM [place] Pager [phone] Office [phone]

## 2019-03-07 NOTE — Progress Notes (Signed)
Pt refusing step test. Pt requests to speak to MD before doing. Will pass along to night shift RN.

## 2019-03-07 NOTE — Consult Note (Signed)
WOC consulted for "pressure injury right". Patient was seen and POC initiated in ED yesterday. See consult notes and orders. Following along with PT (they will begin hydrotherapy today) [name], [place], [place]

## 2019-03-07 NOTE — Procedures (Signed)
UTI  S/p fluoro exchg 22 fr balloon tip SP tube  No comp Stable Full report in pacs
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
## 2019-03-07 NOTE — Progress Notes (Signed)
Pt had vomiting episode with RN at bedside, pt requesting phenergan as pt states "zofran doesn't work" MD made aware will continue to monitor.

## 2019-03-08 DIAGNOSIS — Z8739 Personal history of other diseases of the musculoskeletal system and connective tissue: Secondary | ICD-10-CM

## 2019-03-08 DIAGNOSIS — Z933 Colostomy status: Secondary | ICD-10-CM

## 2019-03-08 DIAGNOSIS — Z881 Allergy status to other antibiotic agents status: Secondary | ICD-10-CM

## 2019-03-08 DIAGNOSIS — M4628 Osteomyelitis of vertebra, sacral and sacrococcygeal region: Secondary | ICD-10-CM | POA: Diagnosis not present

## 2019-03-08 DIAGNOSIS — Z8619 Personal history of other infectious and parasitic diseases: Secondary | ICD-10-CM

## 2019-03-08 DIAGNOSIS — M869 Osteomyelitis, unspecified: Secondary | ICD-10-CM | POA: Diagnosis not present

## 2019-03-08 DIAGNOSIS — N319 Neuromuscular dysfunction of bladder, unspecified: Secondary | ICD-10-CM | POA: Diagnosis not present

## 2019-03-08 DIAGNOSIS — Z960 Presence of urogenital implants: Secondary | ICD-10-CM

## 2019-03-08 DIAGNOSIS — D638 Anemia in other chronic diseases classified elsewhere: Secondary | ICD-10-CM | POA: Diagnosis not present

## 2019-03-08 DIAGNOSIS — Z8719 Personal history of other diseases of the digestive system: Secondary | ICD-10-CM

## 2019-03-08 DIAGNOSIS — Z888 Allergy status to other drugs, medicaments and biological substances status: Secondary | ICD-10-CM

## 2019-03-08 DIAGNOSIS — G8253 Quadriplegia, C5-C7 complete: Secondary | ICD-10-CM | POA: Diagnosis not present

## 2019-03-08 DIAGNOSIS — N3000 Acute cystitis without hematuria: Secondary | ICD-10-CM | POA: Diagnosis not present

## 2019-03-08 DIAGNOSIS — L89159 Pressure ulcer of sacral region, unspecified stage: Secondary | ICD-10-CM

## 2019-03-08 MED ORDER — PRO-STAT SUGAR FREE PO LIQD
30.0000 mL | Freq: Three times a day (TID) | ORAL | Status: DC
  Administered 2019-03-09 – 2019-03-13 (×5): 30 mL via ORAL
  Filled 2019-03-08 (×11): qty 30

## 2019-03-08 MED ORDER — JUVEN PO PACK
1.0000 | PACK | Freq: Two times a day (BID) | ORAL | Status: DC
  Administered 2019-03-09 – 2019-03-13 (×7): 1 via ORAL
  Filled 2019-03-08 (×11): qty 1

## 2019-03-08 MED ORDER — POTASSIUM CHLORIDE CRYS ER 20 MEQ PO TBCR
40.0000 meq | EXTENDED_RELEASE_TABLET | Freq: Once | ORAL | Status: AC
  Administered 2019-03-08: 40 meq via ORAL
  Filled 2019-03-08: qty 2
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
## 2019-03-08 NOTE — Progress Notes (Signed)
Physical Therapy Wound Treatment Patient Details  Name: [name] [name] MRN: [id number] Date of Birth: 01/03/1966  Today's Date: 03/08/2019 Time: 6387-5643 Time Calculation (min): 43 min  Subjective  Subjective: (Okay that's fine) Patient and Family Stated Goals: not stated Prior Treatments: dressing changes, bedside debridement  Pain Score:    Wound Assessment  Dressing Type ABD;Gauze (Comment);Moist to dry 03/08/19 1000  Dressing Changed;Clean;Dry;Intact 03/08/19 1000  Dressing Change Frequency Daily 03/08/19 1000  State of Healing Eschar 03/08/19 1000  Site / Wound Assessment Dressing in place / Unable to assess 03/08/19 1000  % Wound base Red or Granulating 70% 03/08/19 1000  % Wound base Yellow/Fibrinous Exudate 15% 03/08/19 1000  % Wound base Black/Eschar 15% 03/08/19 1000  % Wound base Other/Granulation Tissue (Comment) 0% 03/08/19 1000  Peri-wound Assessment Pink 03/08/19 1000  Wound Length (cm) 7 cm 03/07/19 1100  Wound Width (cm) 6 cm 03/07/19 1100  Wound Depth (cm) 3 cm 03/07/19 1100  Wound Surface Area (cm^2) 42 cm^2 03/07/19 1100  Wound Volume (cm^3) 126 cm^3 03/07/19 1100  Tunneling (cm) 5 cm tunnel at 10 oclock 03/07/19 1100  Undermining (cm) 11 to 1 oclock 1-2 cm 03/07/19 1100  Margins Unattached edges (unapproximated) 03/07/19 1100  Drainage Amount Moderate 03/08/19 1000  Drainage Description Purulent 03/08/19 1000  Treatment Debridement (Selective) 03/08/19 1000     Pressure Injury 03/06/19 Back Lateral;Right;Upper Stage III -  Full thickness tissue loss. Subcutaneous fat may be visible but bone, tendon or muscle are NOT exposed. Pink, Red (Active)  Dressing Type ABD;Gauze (Comment);Moist to dry 03/08/19 1000  Dressing Changed;Clean;Dry;Intact 03/08/19 1000  Dressing Change Frequency Daily 03/08/19 1000  State of Healing Early/partial granulation 03/08/19 1000  Site / Wound Assessment Dressing in place / Unable to assess 03/08/19 1000  % Wound base Red or  Granulating 90% 03/08/19 1000  % Wound base Yellow/Fibrinous Exudate 10% 03/08/19 1000  % Wound base Black/Eschar 0% 03/08/19 1000  % Wound base Other/Granulation Tissue (Comment) 0% 03/08/19 1000  Peri-wound Assessment Pink 03/08/19 1000  Wound Length (cm) 11.5 cm 03/07/19 1100  Wound Width (cm) 5 cm 03/07/19 1100  Wound Depth (cm) 0.1 cm 03/07/19 1100  Wound Surface Area (cm^2) 57.5 cm^2 03/07/19 1100  Wound Volume (cm^3) 5.75 cm^3 03/07/19 1100  Margins Unattached edges (unapproximated) 03/08/19 1000  Drainage Amount Minimal 03/08/19 1000  Drainage Description Sanguineous 03/08/19 1000  Treatment Hydrotherapy (Pulse lavage) 03/08/19 1000     Pressure Injury 03/06/19 Hip Right;Lateral Unstageable - Full thickness tissue loss in which the base of the ulcer is covered by slough (yellow, tan, gray, green or brown) and/or eschar (tan, brown or black) in the wound bed. Yellow, tan, slough (Active)  Dressing Type ABD;Gauze (Comment);Moist to dry 03/08/19 1000  Dressing Changed;Clean;Dry;Intact 03/08/19 1000  Dressing Change Frequency Daily 03/08/19 1000  State of Healing Eschar 03/08/19 1000  Site / Wound Assessment Dressing in place / Unable to assess 03/08/19 1000  % Wound base Red or Granulating 10% 03/08/19 1000  % Wound base Yellow/Fibrinous Exudate 60% 03/08/19 1000  % Wound base Black/Eschar 30% 03/08/19 1000  % Wound base Other/Granulation Tissue (Comment) 0% 03/08/19 1000  Peri-wound Assessment Pink 03/08/19 1000  Wound Length (cm) 5 cm 03/07/19 1100  Wound Width (cm) 7 cm 03/07/19 1100  Wound Depth (cm) 0.1 cm 03/07/19 1100  Wound Surface Area (cm^2) 35 cm^2 03/07/19 1100  Wound Volume (cm^3) 3.5 cm^3 03/07/19 1100  Margins Unattached edges (unapproximated) 03/08/19 1000  Drainage Amount Moderate 03/08/19  1000  Drainage Description Purulent 03/08/19 1000  Treatment Hydrotherapy (Pulse lavage);Debridement (Selective) 03/08/19 1000     Santyl applied to wound bed prior to  applying dressing.  Hydrotherapy Pulsed lavage therapy - wound location: Rt upper and lower flank and rt lateral hip Pulsed Lavage with Suction (psi): 8 psi Pulsed Lavage with Suction - Normal Saline Used: 2000 mL Pulsed Lavage Tip: Tip with splash shield Selective Debridement Selective Debridement - Location: rt lower flank and rt lateral hip Selective Debridement - Tools Used: Forceps;Scissors;Scalpel Selective Debridement - Tissue Removed: brown, black, yellow necrotic tissue   Wound Assessment and Plan  Wound Therapy - Assess/Plan/Recommendations Wound Therapy - Clinical Statement: Pt continues to benefit from hydrotherapy to decrease necrosis and bioburden and promote wound healing.   Wound Therapy - Functional Problem List: Limited options for positioning due to multiple pressure areas Factors Delaying/Impairing Wound Healing: Infection - systemic/local;Immobility;Multiple medical problems;Polypharmacy;Altered sensation Hydrotherapy Plan: Debridement;Dressing change;Patient/family education;Pulsatile lavage with suction Wound Therapy - Frequency: 6X / week Wound Therapy - Follow Up Recommendations: Home health RN Wound Plan: See above  Wound Therapy Goals- Improve the function of patient's integumentary system by progressing the wound(s) through the phases of wound healing (inflammation - proliferation - remodeling) by: Decrease Necrotic Tissue to: 25/10 Decrease Necrotic Tissue - Progress: Progressing toward goal Increase Granulation Tissue to: 75/90 Increase Granulation Tissue - Progress: Progressing toward goal Goals/treatment plan/discharge plan were made with and agreed upon by patient/family: Yes Time For Goal Achievement: 7 days Wound Therapy - Potential for Goals: Fair  Goals will be updated until maximal potential achieved or discharge criteria met.  Discharge criteria: when goals achieved, discharge from hospital, MD decision/surgical intervention, no progress towards  goals, refusal/missing three consecutive treatments without notification or medical reason.  GP     Cristela Blue 03/08/2019, 10:56 AM Governor Rooks, PTA Acute Rehabilitation Services Pager [phone] Office [phone]
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
## 2019-03-09 DIAGNOSIS — D638 Anemia in other chronic diseases classified elsewhere: Secondary | ICD-10-CM | POA: Diagnosis not present

## 2019-03-09 DIAGNOSIS — M869 Osteomyelitis, unspecified: Secondary | ICD-10-CM | POA: Diagnosis not present
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
## 2019-03-09 NOTE — Progress Notes (Signed)
Report was given to the oncoming RN. Patient mews score was 2. Patient remains A/O x 4 with no signs or symptoms of distress. He is chronically hypotensive. Monitoring frequently for patient safety. Faye Public house manager was made aware.

## 2019-03-09 NOTE — Progress Notes (Addendum)
Physical Therapy Wound Treatment Patient Details  Name: [name] [name] MRN: [id number] Date of Birth: July 16, 1965  Today's Date: 03/09/2019 Time: 1020-1111 Time Calculation (min): 51 min  Subjective  Subjective: "let's just get this over with" Patient and Family Stated Goals: not stated Date of Onset: (long term, chronic) Prior Treatments: dressing changes, bedside debridement  Pain Score: Pt did not report pain with treatment however was not comfortable on his L side.   Wound Assessment  Pressure Injury 03/06/19 Flank Right;Lower pink, yellow, slough (Active)  Dressing Type ABD;Gauze (Comment);Moist to dry;Barrier Film (skin prep) 03/09/19 1339  Dressing Clean;Dry;Intact;Changed 03/09/19 1339  Dressing Change Frequency Daily 03/09/19 1339  State of Healing Non-healing 03/09/19 1339  Site / Wound Assessment Pink;Yellow 03/09/19 1339  % Wound base Red or Granulating 75% 03/09/19 1339  % Wound base Yellow/Fibrinous Exudate 20% 03/09/19 1339  % Wound base Black/Eschar 5% 03/09/19 1339  % Wound base Other/Granulation Tissue (Comment) 0% 03/09/19 1339  Peri-wound Assessment Pink 03/09/19 1339  Wound Length (cm) 7 cm 03/07/19 1100  Wound Width (cm) 6 cm 03/07/19 1100  Wound Depth (cm) 3 cm 03/07/19 1100  Wound Surface Area (cm^2) 42 cm^2 03/07/19 1100  Wound Volume (cm^3) 126 cm^3 03/07/19 1100  Tunneling (cm) 5 cm tunnel at 10 oclock 03/07/19 1100  Undermining (cm) 11 to 1 oclock 1-2 cm 03/07/19 1100  Margins Unattached edges (unapproximated) 03/09/19 1339  Drainage Amount Moderate 03/09/19 1339  Drainage Description Purulent 03/09/19 1339  Treatment Debridement (Selective);Hydrotherapy (Pulse lavage);Packing (Saline gauze) 03/09/19 1339   Santyl applied to wound bed prior to applying dressing.    Pressure Injury 03/06/19 Back Lateral;Right;Upper Stage III -  Full thickness tissue loss. Subcutaneous fat may be visible but bone, tendon or muscle are NOT exposed. Pink, Red (Active)   Dressing Type ABD;Gauze (Comment);Moist to dry;Barrier Film (skin prep) 03/09/19 1339  Dressing Clean;Dry;Intact;Changed 03/09/19 1339  Dressing Change Frequency Daily 03/09/19 1339  State of Healing Early/partial granulation 03/09/19 1339  Site / Wound Assessment Pink;Yellow 03/09/19 1339  % Wound base Red or Granulating 90% 03/09/19 1339  % Wound base Yellow/Fibrinous Exudate 10% 03/09/19 1339  % Wound base Black/Eschar 0% 03/09/19 1339  % Wound base Other/Granulation Tissue (Comment) 0% 03/09/19 1339  Peri-wound Assessment Pink 03/09/19 1339  Wound Length (cm) 11.5 cm 03/07/19 1100  Wound Width (cm) 5 cm 03/07/19 1100  Wound Depth (cm) 0.1 cm 03/07/19 1100  Wound Surface Area (cm^2) 57.5 cm^2 03/07/19 1100  Wound Volume (cm^3) 5.75 cm^3 03/07/19 1100  Margins Unattached edges (unapproximated) 03/09/19 1339  Drainage Amount Moderate 03/09/19 1339  Drainage Description Purulent 03/09/19 1339  Treatment Debridement (Selective);Hydrotherapy (Pulse lavage);Packing (Saline gauze) 03/09/19 1339   Santyl applied to wound bed prior to applying dressing.   Pressure Injury 03/06/19 Hip Right;Lateral Unstageable - Full thickness tissue loss in which the base of the ulcer is covered by slough (yellow, tan, gray, green or brown) and/or eschar (tan, brown or black) in the wound bed. Yellow, tan, slough (Active)  Dressing Type ABD;Gauze (Comment);Moist to dry;Barrier Film (skin prep) 03/09/19 1339  Dressing Clean;Dry;Intact;Changed 03/09/19 1339  Dressing Change Frequency Daily 03/09/19 1339  State of Healing Eschar 03/09/19 1339  Site / Wound Assessment Pink;Yellow;Black 03/09/19 1339  % Wound base Red or Granulating 10% 03/09/19 1339  % Wound base Yellow/Fibrinous Exudate 60% 03/09/19 1339  % Wound base Black/Eschar 30% 03/09/19 1339  % Wound base Other/Granulation Tissue (Comment) 0% 03/09/19 1339  Peri-wound Assessment Pink 03/09/19 1339  Wound Length (  cm) 5 cm 03/07/19 1100  Wound Width  (cm) 7 cm 03/07/19 1100  Wound Depth (cm) 0.1 cm 03/07/19 1100  Wound Surface Area (cm^2) 35 cm^2 03/07/19 1100  Wound Volume (cm^3) 3.5 cm^3 03/07/19 1100  Margins Unattached edges (unapproximated) 03/09/19 1339  Drainage Amount Moderate 03/09/19 1339  Drainage Description Purulent 03/09/19 1339  Treatment Debridement (Selective);Hydrotherapy (Pulse lavage);Packing (Saline gauze) 03/09/19 1339   Santyl applied to wound bed prior to applying dressing.     Hydrotherapy Pulsed lavage therapy - wound location: Rt upper and lower flank and rt lateral hip Pulsed Lavage with Suction (psi): 8 psi Pulsed Lavage with Suction - Normal Saline Used: 1000 mL Pulsed Lavage Tip: Tip with splash shield Selective Debridement Selective Debridement - Location: Rt upper and lower flank and rt lateral hip Selective Debridement - Tools Used: Forceps;Scissors;Scalpel Selective Debridement - Tissue Removed: brown, black, yellow necrotic tissue   Wound Assessment and Plan  Wound Therapy - Assess/Plan/Recommendations Wound Therapy - Clinical Statement: Pt continues to benefit from hydrotherapy to decrease necrosis and bioburden and promote wound healing.   Wound Therapy - Functional Problem List: Limited options for positioning due to multiple pressure areas Factors Delaying/Impairing Wound Healing: Infection - systemic/local;Immobility;Multiple medical problems;Polypharmacy;Altered sensation Hydrotherapy Plan: Debridement;Dressing change;Patient/family education;Pulsatile lavage with suction Wound Therapy - Frequency: 6X / week Wound Therapy - Follow Up Recommendations: Home health RN Wound Plan: See above  Wound Therapy Goals- Improve the function of patient's integumentary system by progressing the wound(s) through the phases of wound healing (inflammation - proliferation - remodeling) by: Decrease Necrotic Tissue to: 25/10 Decrease Necrotic Tissue - Progress: Progressing toward goal Increase Granulation  Tissue to: 75/90 Increase Granulation Tissue - Progress: Progressing toward goal Goals/treatment plan/discharge plan were made with and agreed upon by patient/family: Yes Time For Goal Achievement: 7 days Wound Therapy - Potential for Goals: Fair  Goals will be updated until maximal potential achieved or discharge criteria met.  Discharge criteria: when goals achieved, discharge from hospital, MD decision/surgical intervention, no progress towards goals, refusal/missing three consecutive treatments without notification or medical reason.  GP     Thelma Comp 03/09/2019, 1:50 PM   Rolinda Roan, PT, DPT Acute Rehabilitation Services Pager: [phone] Office: [phone]

## 2019-03-10 DIAGNOSIS — D638 Anemia in other chronic diseases classified elsewhere: Secondary | ICD-10-CM | POA: Diagnosis not present

## 2019-03-10 DIAGNOSIS — M869 Osteomyelitis, unspecified: Secondary | ICD-10-CM | POA: Diagnosis not present

## 2019-03-10 LAB — BASIC METABOLIC PANEL
Anion gap: 3 — ABNORMAL LOW (ref 5–15)
BUN: 14 mg/dL (ref 6–20)
CO2: 24 mmol/L (ref 22–32)
Calcium: 7.4 mg/dL — ABNORMAL LOW (ref 8.9–10.3)
Chloride: 111 mmol/L (ref 98–111)
Creatinine, Ser: <0.3 mg/dL — ABNORMAL LOW (ref 0.61–1.24)
Glucose, Bld: 88 mg/dL (ref 70–99)
Potassium: 3.8 mmol/L (ref 3.5–5.1)
Sodium: 138 mmol/L (ref 135–145)

## 2019-03-10 LAB — CBC
HCT: 20.2 % — ABNORMAL LOW (ref 39.0–52.0)
Hemoglobin: 6.2 g/dL — CL (ref 13.0–17.0)
MCH: 24.7 pg — ABNORMAL LOW (ref 26.0–34.0)
MCHC: 30.7 g/dL (ref 30.0–36.0)
MCV: 80.5 fL (ref 80.0–100.0)
Platelets: 378 10*3/uL (ref 150–400)
RBC: 2.51 MIL/uL — ABNORMAL LOW (ref 4.22–5.81)
RDW: 16.3 % — ABNORMAL HIGH (ref 11.5–15.5)
WBC: 8.8 10*3/uL (ref 4.0–10.5)
nRBC: 0 % (ref 0.0–0.2)

## 2019-03-10 LAB — HEMOGLOBIN AND HEMATOCRIT, BLOOD
HCT: 26.8 % — ABNORMAL LOW (ref 39.0–52.0)
Hemoglobin: 8.5 g/dL — ABNORMAL LOW (ref 13.0–17.0)

## 2019-03-10 LAB — PREPARE RBC (CROSSMATCH)

## 2019-03-10 LAB — CULTURE, BLOOD (ROUTINE X 2)
Culture: NO GROWTH
Special Requests: ADEQUATE

## 2019-03-10 MED ORDER — SODIUM CHLORIDE 0.9 % IV SOLN
INTRAVENOUS | Status: DC
  Administered 2019-03-10: 19:00:00 via INTRAVENOUS

## 2019-03-10 MED ORDER — CHLORHEXIDINE GLUCONATE CLOTH 2 % EX PADS
6.0000 | MEDICATED_PAD | Freq: Once | CUTANEOUS | Status: AC
  Administered 2019-03-10: 22:00:00 6 via TOPICAL

## 2019-03-10 MED ORDER — SODIUM CHLORIDE 0.9% IV SOLUTION
Freq: Once | INTRAVENOUS | Status: AC
  Administered 2019-03-10: 10:00:00 via INTRAVENOUS

## 2019-03-10 MED ORDER — CEFAZOLIN SODIUM-DEXTROSE 2-4 GM/100ML-% IV SOLN
2.0000 g | INTRAVENOUS | Status: AC
  Administered 2019-03-11: 2 g via INTRAVENOUS

## 2019-03-10 MED ORDER — CHLORHEXIDINE GLUCONATE CLOTH 2 % EX PADS: 6.0000 | MEDICATED_PAD | Freq: Once | CUTANEOUS | Status: AC

## 2019-03-10 NOTE — Progress Notes (Signed)
CRITICAL VALUE ALERT  Critical Value:  Hemoglobin= 6.2  Date & Time Notied:  03/10/2019 0435  Provider Notified: X. Blount, NP  Orders Received/Actions taken: New orders placed into [facility] to be carried out

## 2019-03-10 NOTE — Progress Notes (Signed)
Unit of blood transfused as ordered with no s/s of reaction noted
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
## 2019-03-11 ENCOUNTER — Encounter (HOSPITAL_COMMUNITY): Payer: Self-pay | Admitting: Plastic Surgery

## 2019-03-11 ENCOUNTER — Inpatient Hospital Stay (HOSPITAL_COMMUNITY): Payer: Medicare Other | Admitting: Certified Registered Nurse Anesthetist

## 2019-03-11 ENCOUNTER — Encounter (HOSPITAL_COMMUNITY)
Admission: EM | Disposition: A | Discharge status: Home Health | Payer: Medicare Other | Source: Home / Self Care | Attending: Internal Medicine

## 2019-03-11 DIAGNOSIS — M869 Osteomyelitis, unspecified: Secondary | ICD-10-CM | POA: Diagnosis not present

## 2019-03-11 DIAGNOSIS — T07XXXA Unspecified multiple injuries, initial encounter: Secondary | ICD-10-CM | POA: Diagnosis not present

## 2019-03-11 DIAGNOSIS — L89159 Pressure ulcer of sacral region, unspecified stage: Secondary | ICD-10-CM

## 2019-03-11 DIAGNOSIS — L89109 Pressure ulcer of unspecified part of back, unspecified stage: Secondary | ICD-10-CM

## 2019-03-11 DIAGNOSIS — G8253 Quadriplegia, C5-C7 complete: Secondary | ICD-10-CM | POA: Diagnosis not present

## 2019-03-11 DIAGNOSIS — D638 Anemia in other chronic diseases classified elsewhere: Secondary | ICD-10-CM | POA: Diagnosis not present

## 2019-03-11 HISTORY — PX: INCISION AND DRAINAGE OF WOUND: SHX1803

## 2019-03-11 LAB — TYPE AND SCREEN
ABO/RH(D): B POS
Antibody Screen: NEGATIVE
Unit division: 0

## 2019-03-11 LAB — BPAM RBC
Blood Product Expiration Date: 202011202359
ISSUE DATE / TIME: 202010250937
Unit Type and Rh: 7300

## 2019-03-11 LAB — CULTURE, BLOOD (ROUTINE X 2): Culture: NO GROWTH

## 2019-03-11 SURGERY — IRRIGATION AND DEBRIDEMENT WOUND
Anesthesia: General | Site: Back

## 2019-03-11 MED ORDER — FENTANYL CITRATE (PF) 250 MCG/5ML IJ SOLN
INTRAMUSCULAR | Status: DC | PRN
  Administered 2019-03-11 (×2): 50 ug via INTRAVENOUS

## 2019-03-11 MED ORDER — MIDAZOLAM HCL 5 MG/5ML IJ SOLN
INTRAMUSCULAR | Status: DC | PRN
  Administered 2019-03-11: 1 mg via INTRAVENOUS

## 2019-03-11 MED ORDER — LIDOCAINE-EPINEPHRINE 1 %-1:100000 IJ SOLN
INTRAMUSCULAR | Status: AC
  Filled 2019-03-11: qty 1

## 2019-03-11 MED ORDER — VANCOMYCIN HCL 10 G IV SOLR
1500.0000 mg | INTRAVENOUS | Status: DC
  Filled 2019-03-11: qty 1500

## 2019-03-11 MED ORDER — PROPOFOL 10 MG/ML IV BOLUS
INTRAVENOUS | Status: DC | PRN
  Administered 2019-03-11: 150 mg via INTRAVENOUS

## 2019-03-11 MED ORDER — DEXAMETHASONE SODIUM PHOSPHATE 10 MG/ML IJ SOLN
INTRAMUSCULAR | Status: AC
  Filled 2019-03-11: qty 1

## 2019-03-11 MED ORDER — CEFAZOLIN SODIUM 1 G IJ SOLR
INTRAMUSCULAR | Status: AC
  Filled 2019-03-11: qty 20

## 2019-03-11 MED ORDER — ROCURONIUM BROMIDE 10 MG/ML (PF) SYRINGE
PREFILLED_SYRINGE | INTRAVENOUS | Status: AC
  Filled 2019-03-11: qty 10

## 2019-03-11 MED ORDER — ONDANSETRON HCL 4 MG/2ML IJ SOLN
INTRAMUSCULAR | Status: AC
  Filled 2019-03-11: qty 2

## 2019-03-11 MED ORDER — ONDANSETRON HCL 4 MG/2ML IJ SOLN
INTRAMUSCULAR | Status: DC | PRN
  Administered 2019-03-11: 4 mg via INTRAVENOUS

## 2019-03-11 MED ORDER — SODIUM CHLORIDE 0.9 % IV SOLN
1.0000 g | Freq: Three times a day (TID) | INTRAVENOUS | Status: DC
  Administered 2019-03-11 – 2019-03-13 (×6): 1 g via INTRAVENOUS
  Filled 2019-03-11 (×10): qty 1

## 2019-03-11 MED ORDER — ROCURONIUM BROMIDE 100 MG/10ML IV SOLN
INTRAVENOUS | Status: DC | PRN
  Administered 2019-03-11: 60 mg via INTRAVENOUS

## 2019-03-11 MED ORDER — ALBUMIN HUMAN 5 % IV SOLN
INTRAVENOUS | Status: DC | PRN
  Administered 2019-03-11: 09:00:00 via INTRAVENOUS

## 2019-03-11 MED ORDER — MIDAZOLAM HCL 2 MG/2ML IJ SOLN
INTRAMUSCULAR | Status: AC
  Filled 2019-03-11: qty 2

## 2019-03-11 MED ORDER — OXYCODONE HCL 5 MG PO TABS: 5.0000 mg | ORAL_TABLET | Freq: Once | ORAL | Status: DC | PRN

## 2019-03-11 MED ORDER — LACTATED RINGERS IV SOLN
INTRAVENOUS | Status: DC | PRN
  Administered 2019-03-11: 08:00:00 via INTRAVENOUS

## 2019-03-11 MED ORDER — SODIUM CHLORIDE 0.9 % IV SOLN
INTRAVENOUS | Status: DC | PRN
  Administered 2019-03-11: 500 mL

## 2019-03-11 MED ORDER — SODIUM CHLORIDE 0.9 % IV SOLN
INTRAVENOUS | Status: AC
  Filled 2019-03-11: qty 500000

## 2019-03-11 MED ORDER — FENTANYL CITRATE (PF) 100 MCG/2ML IJ SOLN: 25.0000 ug | INTRAMUSCULAR | Status: DC | PRN

## 2019-03-11 MED ORDER — PHENYLEPHRINE 40 MCG/ML (10ML) SYRINGE FOR IV PUSH (FOR BLOOD PRESSURE SUPPORT)
PREFILLED_SYRINGE | INTRAVENOUS | Status: DC | PRN
  Administered 2019-03-11: 80 ug via INTRAVENOUS
  Administered 2019-03-11 (×2): 120 ug via INTRAVENOUS
  Administered 2019-03-11: 80 ug via INTRAVENOUS
  Administered 2019-03-11: 120 ug via INTRAVENOUS
  Administered 2019-03-11: 80 ug via INTRAVENOUS

## 2019-03-11 MED ORDER — SODIUM CHLORIDE 0.9 % IV SOLN
INTRAVENOUS | Status: DC | PRN
  Administered 2019-03-11: 50 ug/min via INTRAVENOUS

## 2019-03-11 MED ORDER — OXYCODONE HCL 5 MG/5ML PO SOLN: 5.0000 mg | Freq: Once | ORAL | Status: DC | PRN

## 2019-03-11 MED ORDER — FENTANYL CITRATE (PF) 250 MCG/5ML IJ SOLN
INTRAMUSCULAR | Status: AC
  Filled 2019-03-11: qty 5

## 2019-03-11 MED ORDER — SUGAMMADEX SODIUM 200 MG/2ML IV SOLN
INTRAVENOUS | Status: DC | PRN
  Administered 2019-03-11: 200 mg via INTRAVENOUS

## 2019-03-11 MED ORDER — VANCOMYCIN HCL 10 G IV SOLR
2000.0000 mg | Freq: Once | INTRAVENOUS | Status: AC
  Administered 2019-03-11: 19:00:00 2000 mg via INTRAVENOUS
  Filled 2019-03-11: qty 2000

## 2019-03-11 MED ORDER — LIDOCAINE-EPINEPHRINE 1 %-1:100000 IJ SOLN
INTRAMUSCULAR | Status: DC | PRN
  Administered 2019-03-11: 20 mL

## 2019-03-11 MED ORDER — LIDOCAINE 2% (20 MG/ML) 5 ML SYRINGE
INTRAMUSCULAR | Status: AC
  Filled 2019-03-11: qty 5

## 2019-03-11 MED ORDER — ONDANSETRON HCL 4 MG/2ML IJ SOLN: 4.0000 mg | Freq: Four times a day (QID) | INTRAMUSCULAR | Status: DC | PRN

## 2019-03-11 MED ORDER — PROPOFOL 10 MG/ML IV BOLUS
INTRAVENOUS | Status: AC
  Filled 2019-03-11: qty 40

## 2019-03-11 MED ORDER — DEXAMETHASONE SODIUM PHOSPHATE 10 MG/ML IJ SOLN
INTRAMUSCULAR | Status: DC | PRN
  Administered 2019-03-11: 5 mg via INTRAVENOUS

## 2019-03-11 MED ORDER — 0.9 % SODIUM CHLORIDE (POUR BTL) OPTIME
TOPICAL | Status: DC | PRN
  Administered 2019-03-11: 1000 mL

## 2019-03-11 SURGICAL SUPPLY — 51 items
APL SKNCLS STERI-STRIP NONHPOA (GAUZE/BANDAGES/DRESSINGS) ×1
APL SWBSTK 6 STRL LF DISP (MISCELLANEOUS)
APPLICATOR COTTON TIP 6 STRL (MISCELLANEOUS) IMPLANT
APPLICATOR COTTON TIP 6IN STRL (MISCELLANEOUS) IMPLANT
BAG DECANTER FOR FLEXI CONT (MISCELLANEOUS) ×1 IMPLANT
BENZOIN TINCTURE PRP APPL 2/3 (GAUZE/BANDAGES/DRESSINGS) ×2 IMPLANT
CANISTER SUCT 3000ML PPV (MISCELLANEOUS) ×2 IMPLANT
CONT SPEC 4OZ CLIKSEAL STRL BL (MISCELLANEOUS) IMPLANT
COVER SURGICAL LIGHT HANDLE (MISCELLANEOUS) ×2 IMPLANT
COVER WAND RF STERILE (DRAPES) ×1 IMPLANT
DRAPE HALF SHEET 40X57 (DRAPES) ×1 IMPLANT
DRAPE IMP U-DRAPE 54X76 (DRAPES) ×2 IMPLANT
DRAPE INCISE IOBAN 66X45 STRL (DRAPES) IMPLANT
DRAPE LAPAROSCOPIC ABDOMINAL (DRAPES) IMPLANT
DRAPE LAPAROTOMY 100X72 PEDS (DRAPES) ×2 IMPLANT
DRAPE ORTHO SPLIT 87X125 STRL (DRAPES) ×2 IMPLANT
DRESSING HYDROCOLLOID 4X4 (GAUZE/BANDAGES/DRESSINGS) ×2 IMPLANT
DRSG ADAPTIC 3X8 NADH LF (GAUZE/BANDAGES/DRESSINGS) IMPLANT
DRSG CUTIMED SORBACT 7X9 (GAUZE/BANDAGES/DRESSINGS) ×1 IMPLANT
DRSG PAD ABDOMINAL 8X10 ST (GAUZE/BANDAGES/DRESSINGS) ×1 IMPLANT
DRSG TELFA 3X8 NADH (GAUZE/BANDAGES/DRESSINGS) ×4 IMPLANT
DRSG VAC ATS LRG SENSATRAC (GAUZE/BANDAGES/DRESSINGS) IMPLANT
ELECT CAUTERY BLADE 6.4 (BLADE) IMPLANT
ELECT REM PT RETURN 9FT ADLT (ELECTROSURGICAL) ×2
ELECTRODE REM PT RTRN 9FT ADLT (ELECTROSURGICAL) ×1 IMPLANT
GAUZE SPONGE 4X4 12PLY STRL (GAUZE/BANDAGES/DRESSINGS) ×1 IMPLANT
GLOVE BIO SURGEON STRL SZ 6.5 (GLOVE) ×2 IMPLANT
GOWN STRL REUS W/ TWL LRG LVL3 (GOWN DISPOSABLE) ×3 IMPLANT
GOWN STRL REUS W/TWL LRG LVL3 (GOWN DISPOSABLE) ×6
KIT BASIN OR (CUSTOM PROCEDURE TRAY) ×2 IMPLANT
KIT TURNOVER KIT B (KITS) ×2 IMPLANT
MATRIX WOUND 3-LAYER 10X15 (Tissue) ×1 IMPLANT
MICROMATRIX 1000MG (Tissue) ×4 IMPLANT
NDL HYPO 25GX1X1/2 BEV (NEEDLE) ×1 IMPLANT
NEEDLE HYPO 25GX1X1/2 BEV (NEEDLE) ×2 IMPLANT
NS IRRIG 1000ML POUR BTL (IV SOLUTION) ×2 IMPLANT
PACK GENERAL/GYN (CUSTOM PROCEDURE TRAY) ×2 IMPLANT
PACK UNIVERSAL I (CUSTOM PROCEDURE TRAY) ×2 IMPLANT
PAD ARMBOARD 7.5X6 YLW CONV (MISCELLANEOUS) ×4 IMPLANT
PAD DRESSING TELFA 3X8 NADH (GAUZE/BANDAGES/DRESSINGS) IMPLANT
SOLUTION PARTIC MCRMTRX 1000MG (Tissue) IMPLANT
STAPLER VISISTAT 35W (STAPLE) ×1 IMPLANT
SURGILUBE 2OZ TUBE FLIPTOP (MISCELLANEOUS) ×3 IMPLANT
SUT MNCRL AB 4-0 PS2 18 (SUTURE) IMPLANT
SUT VIC AB 5-0 PS2 18 (SUTURE) ×7 IMPLANT
SWAB COLLECTION DEVICE MRSA (MISCELLANEOUS) IMPLANT
SWAB CULTURE ESWAB REG 1ML (MISCELLANEOUS) IMPLANT
SYR CONTROL 10ML LL (SYRINGE) ×2 IMPLANT
TAPE CLOTH SURG 6X10 WHT LF (GAUZE/BANDAGES/DRESSINGS) ×1 IMPLANT
TOWEL GREEN STERILE (TOWEL DISPOSABLE) ×2 IMPLANT
UNDERPAD 30X30 (UNDERPADS AND DIAPERS) ×2 IMPLANT

## 2019-03-11 NOTE — Progress Notes (Signed)
Pharmacy Antibiotic Note  [name] [name] is a 53 y.o. male  with PMH of quadriplegia, chronic suprapubic foley cathether with history of UTI, who presents with symptoms concerning for another UTI. Furthermore, the patient has wound infections on his lower right back that has purulent draining, foul smell, which has occurring for the last few days. Pt follows with wound care. He has a history of ESBL E. Coli. Pharmacy has been consulted for Meropenem and Vancomycin dosing for wound coverage.   Pt is Quadriplegic, so creatinine not expected to correspond to drug clearance. Prior AKI due to Vancomycin (2018). Scr at baseline of <0.3. Looking at his past courses from 2018 it seems the patient accumulates on q12h dosing so will start with q24h dosing.   Plan: Vancomycin 2000 mg IV once as loading dose  Vancomycin 1500 IV every 24 hours. Goal trough 10-15 mcg/mL Will dose vancomycin conservatively due to previous AKI from vancomycin Meropenem IV 1g q8 hours Monitor renal function, WBC, clinical status, and temp  Weight: 197 lb 5 oz (89.5 kg)  Temp (24hrs), Avg:97.9 F (36.6 C), Min:97 F (36.1 C), Max:99.7 F (37.6 C)  Recent Labs  Lab 03/05/19 1752 03/06/19 0338 03/06/19 0339 03/07/19 1400 03/10/19 0335  WBC 14.3*  --  14.0* 11.1* 8.8  CREATININE 0.39*  --  0.38* <0.30* <0.30*  LATICACIDVEN 0.7 0.8  --   --   --     CrCl cannot be calculated (This lab value cannot be used to calculate CrCl because it is not a number: <0.30).    Allergies  Allergen Reactions  . Ferumoxytol Anaphylaxis and Other (See Comments)    (Feraheme) = "SYNCOPE; patient tolerated Venofer 10/01/18 s rxn"   . Oxybutynin Other (See Comments)    Hallucinations    . Vancomycin Other (See Comments)    ARF 05-2016 -- affects kidneys     Antimicrobials this admission: Vancomycin 10/26 >>  Meropenem 10/26 >>   Microbiology results: 10/20 BCx: ng 10/20 UCx: multiple species suggest recollection 10/20 COVID:  neg  Thank you for allowing pharmacy to be a part of this patient's care.  Nicoletta Dress, PharmD PGY2 Infectious Disease Pharmacy Resident  03/11/2019 4:15 PM

## 2019-03-11 NOTE — Transfer of Care (Signed)
Immediate Anesthesia Transfer of Care Note  Patient: MACE MORRISETTE  Procedure(s) Performed: Excision of back and sacral ulcers with placement of ACell (N/A Back)  Patient Location: PACU  Anesthesia Type:General  Level of Consciousness: drowsy  Airway & Oxygen Therapy: Patient Spontanous Breathing and Patient connected to face mask oxygen  Post-op Assessment: Report given to RN and Post -op Vital signs reviewed and stable  Post vital signs: Reviewed  Last Vitals:  Vitals Value Taken Time  BP 93/79 03/11/19 0917  Temp    Pulse 90 03/11/19 0920  Resp 13 03/11/19 0920  SpO2 100 % 03/11/19 0920  Vitals shown include unvalidated device data.  Last Pain:  Vitals:   03/10/19 2036  TempSrc:   PainSc: Asleep      Patients Stated Pain Goal: 3 ([id number])  Complications: No apparent anesthesia complications
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
## 2019-03-11 NOTE — Interval H&P Note (Signed)
History and Physical Interval Note:  03/11/2019 7:38 AM  [name] [name]  has presented today for surgery, with the diagnosis of multiple pressure wounds of back and sacrum.  The various methods of treatment have been discussed with the patient and family. After consideration of risks, benefits and other options for treatment, the patient has consented to  Procedure(s) with comments: Excision of back and sacral ulcers with placement of ACell (N/A) - 75 min, shift following cases as needed, please as a surgical intervention.  The patient's history has been reviewed, patient examined, no change in status, stable for surgery.  I have reviewed the patient's chart and labs.  Questions were answered to the patient's satisfaction.     Loel Lofty Dillingham

## 2019-03-11 NOTE — Progress Notes (Signed)
PT Cancellation Note  Patient Details Name: [name] [name] MRN: [id number] DOB: 06-28-1965   Cancelled Treatment:    Reason Eval/Treat Not Completed: Per chart review, pt to OR for excision of back and sacral wounds with placement of ACell per Plastic Surgery. Will sign off at this time. If needs change, please reconsult if/when appropriate.    Thelma Comp 03/11/2019, 8:31 AM   Rolinda Roan, PT, DPT Acute Rehabilitation Services Pager: [phone] Office: [phone]

## 2019-03-11 NOTE — Anesthesia Preprocedure Evaluation (Signed)
Anesthesia Evaluation  Patient identified by MRN, date of birth, ID band Patient awake    Reviewed: Allergy & Precautions, H&P , NPO status , Patient's Chart, lab work & pertinent test results  Airway Mallampati: II   Neck ROM: full    Dental   Pulmonary sleep apnea ,    breath sounds clear to auscultation       Cardiovascular hypertension, + Peripheral Vascular Disease   Rhythm:regular Rate:Normal     Neuro/Psych Seizures -,  PSYCHIATRIC DISORDERS Depression C5 quadriplegia    GI/Hepatic GERD  ,  Endo/Other    Renal/GU      Musculoskeletal   Abdominal   Peds  Hematology  (+) Blood dyscrasia, anemia ,   Anesthesia Other Findings   Reproductive/Obstetrics                             Anesthesia Physical Anesthesia Plan  ASA: III  Anesthesia Plan: General   Post-op Pain Management:    Induction: Intravenous  PONV Risk Score and Plan: 2 and Ondansetron, Dexamethasone, Midazolam and Treatment may vary due to age or medical condition  Airway Management Planned: Oral ETT  Additional Equipment:   Intra-op Plan:   Post-operative Plan: Extubation in OR  Informed Consent: I have reviewed the patients History and Physical, chart, labs and discussed the procedure including the risks, benefits and alternatives for the proposed anesthesia with the patient or authorized representative who has indicated his/her understanding and acceptance.       Plan Discussed with: CRNA, Anesthesiologist and Surgeon  Anesthesia Plan Comments:         Anesthesia Quick Evaluation

## 2019-03-11 NOTE — Consult Note (Signed)
WOC reviewed cart. Patient is being debrided in the OR today and placement of Acell per plastic surgery.  Patient has been managed for this same wounds per plastics in the past.   [facility] nurse team will sign off.   [place], [place], [place]

## 2019-03-11 NOTE — Anesthesia Procedure Notes (Signed)
Procedure Name: Intubation Date/Time: 03/11/2019 7:57 AM Performed by: Janene Harvey, CRNA Pre-anesthesia Checklist: Patient identified, Emergency Drugs available, Suction available and Patient being monitored Patient Re-evaluated:Patient Re-evaluated prior to induction Oxygen Delivery Method: Circle system utilized Preoxygenation: Pre-oxygenation with 100% oxygen Induction Type: IV induction Ventilation: Mask ventilation without difficulty Laryngoscope Size: Glidescope and 3 Grade View: Grade I Tube type: Oral Tube size: 7.5 mm Number of attempts: 1 Airway Equipment and Method: Stylet and Oral airway Placement Confirmation: ETT inserted through vocal cords under direct vision,  positive ETCO2 and breath sounds checked- equal and bilateral Secured at: 23 cm Tube secured with: Tape Dental Injury: Teeth and Oropharynx as per pre-operative assessment

## 2019-03-11 NOTE — Op Note (Signed)
DATE OF OPERATION: 03/11/2019  LOCATION: [facility] In[facility]  PREOPERATIVE DIAGNOSIS: Back and sacral wounds  POSTOPERATIVE DIAGNOSIS: Same  PROCEDURE:  1. Excision of back wounds 10 x 15 cm in total size of three wounds 2. Placement of Acell 1 gm powder and 10 x 15 cm sheet 3. Preparation of sacral wound 20 x 20 cm for placement of Acell powder 1 gm  SURGEON: Jaking Thayer [place]anger Kalany Diekmann, DO  ASSISTANT: Roetta Sessions, PA  EBL: 5 cc  CONDITION: Stable  COMPLICATIONS: None  INDICATION: The patient, [name] [name], is a 53 y.o. male born on 12-Feb-1966, is here for treatment of chronic back and sacral wounds. The patient prioritized the wounds prior to surgery.  We addressed the wounds accordingly.  PROCEDURE DETAILS:  The patient was seen prior to surgery and marked.  The IV antibiotics were given. The patient was taken to the operating room and given a general anesthetic. A standard time out was performed and all information was confirmed by those in the room. SCDs were placed.  The patient was placed in the left lateral position.  All bone prominent areas were padded.  The back and sacral area were prepped and draped.    Back:  The #10 blade was used to excise the skin and soft tissue of the 10 x 15 cm area of nonviable tissue on the three back wounds.  The wound was irrigated with antibiotic solution.  Hemostasis was achieved with electrocautery. The 1 gm of acell powder and 10 x 15 cm sheet of acell was applied and secured with the 5-0 Vicryl.  The sorbact was applied and secured with the vicryl.  The KY gel was applied.  The restore was placed between the wounds for skin protection.    Sacrum:  The curette was used to excise the sacral wound 20 x 20 cm of fibrous tissue. The wound was irrigated with the antibiotic solution.  There was intermittent granulation and epithelialization.  The 1 gm of acell powder was placed and the sorbact applied.   KY was applied.  Sterile  dressing was applied to all wounds.  The patient was allowed to wake up and taken to recovery room in stable condition at the end of the case. The family was notified at the end of the case.   The advanced practice practitioner (APP) assisted throughout the case.  The APP was essential in retraction and counter traction when needed to make the case progress smoothly.  This retraction and assistance made it possible to see the tissue plans for the procedure.  The assistance was needed for blood control, tissue re-approximation and assisted with closure of the incision site.

## 2019-03-11 NOTE — Progress Notes (Signed)
ID PROGRESS NOTE  53yo M with paraplegia, chronic pressure ulcers with associated osteomyelitis, admitted for worsening drainage and imaging suggesting worsening burden of disease  S- afebrile, underwent debridement by dr Marla Roe this morning, including  1. Excision of back wounds 10 x 15 cm in total size of three wounds 2. Placement of Acell 1 gm powder and 10 x 15 cm sheet 3. Preparation of sacral wound 20 x 20 cm for placement of Acell powder 1 gm    Unfortunately, no culture data to guide regimen presently. Will base his regimen on previous micro data including ESBL  - plan to treat with meropenem and vancomycin x 4 wk - please check sed rate and crp  Jonerik Sliker B. [place] [facility] Infectious [facility] [phone]

## 2019-03-12 ENCOUNTER — Encounter (HOSPITAL_COMMUNITY): Payer: Self-pay | Admitting: Plastic Surgery

## 2019-03-12 DIAGNOSIS — L89109 Pressure ulcer of unspecified part of back, unspecified stage: Secondary | ICD-10-CM | POA: Diagnosis not present

## 2019-03-12 DIAGNOSIS — M4628 Osteomyelitis of vertebra, sacral and sacrococcygeal region: Secondary | ICD-10-CM | POA: Diagnosis not present

## 2019-03-12 DIAGNOSIS — G8220 Paraplegia, unspecified: Secondary | ICD-10-CM | POA: Diagnosis not present

## 2019-03-12 DIAGNOSIS — L89159 Pressure ulcer of sacral region, unspecified stage: Secondary | ICD-10-CM | POA: Diagnosis not present

## 2019-03-12 DIAGNOSIS — D638 Anemia in other chronic diseases classified elsewhere: Secondary | ICD-10-CM | POA: Diagnosis not present

## 2019-03-12 DIAGNOSIS — M869 Osteomyelitis, unspecified: Secondary | ICD-10-CM | POA: Diagnosis not present

## 2019-03-12 LAB — BASIC METABOLIC PANEL
Anion gap: 6 (ref 5–15)
BUN: 13 mg/dL (ref 6–20)
CO2: 23 mmol/L (ref 22–32)
Calcium: 7.3 mg/dL — ABNORMAL LOW (ref 8.9–10.3)
Chloride: 105 mmol/L (ref 98–111)
Creatinine, Ser: <0.3 mg/dL — ABNORMAL LOW (ref 0.61–1.24)
Glucose, Bld: 107 mg/dL — ABNORMAL HIGH (ref 70–99)
Potassium: 3.5 mmol/L (ref 3.5–5.1)
Sodium: 134 mmol/L — ABNORMAL LOW (ref 135–145)

## 2019-03-12 LAB — CBC
HCT: 24 % — ABNORMAL LOW (ref 39.0–52.0)
Hemoglobin: 7.6 g/dL — ABNORMAL LOW (ref 13.0–17.0)
MCH: 25.9 pg — ABNORMAL LOW (ref 26.0–34.0)
MCHC: 31.7 g/dL (ref 30.0–36.0)
MCV: 81.9 fL (ref 80.0–100.0)
Platelets: 350 10*3/uL (ref 150–400)
RBC: 2.93 MIL/uL — ABNORMAL LOW (ref 4.22–5.81)
RDW: 17.3 % — ABNORMAL HIGH (ref 11.5–15.5)
WBC: 10.8 10*3/uL — ABNORMAL HIGH (ref 4.0–10.5)
nRBC: 0 % (ref 0.0–0.2)

## 2019-03-12 NOTE — Progress Notes (Signed)
PHARMACY CONSULT NOTE FOR:  OUTPATIENT  PARENTERAL ANTIBIOTIC THERAPY (OPAT)  Indication: Osteomyelitis  Regimen: Meropenem 1g IV q8h End date: 04/09/2019  IV antibiotic discharge orders are pended. To discharging provider:  please sign these orders via discharge navigator,  Select New Orders & click on the button choice - Manage This Unsigned Work.     Thank you for allowing pharmacy to be a part of this patient's care.  Phillis Haggis 03/12/2019, 4:09 PM

## 2019-03-12 NOTE — TOC Progression Note (Addendum)
Transition of Care [facility]) - Progression Note    Patient Details  Name: [name] [name] MRN: [id number] Date of Birth: 1965/07/15  Transition of [facility]) CM/SW Contact  [name], RN Phone Number: 03/12/2019, 10:39 AM  Clinical Narrative:     Daily  change gauze and ABD daily and apply KY gel to back right wounds and sacrum.  IV [name] with [facility] updated   Plan to discharge to home tomorrow 03/13/19. Patient from home with sister. Face sheet information confirmed. Patient has all DME already. Sister assists with wound care and IV ABX . Patient has been at home before with IV ABX.   [name] with [facility] [place]. Called [name] with [facility] awaiting call back.   Patient will need [facility] transport to address in [facility] on discharge.  Expected Discharge Plan: [place] Barriers to Discharge: Continued Medical Work up  Expected Discharge Plan and Services Expected Discharge Plan: [place] Acute Care Choice: Home Health                   DME Arranged: N/A         HH Arranged: RN(WILL NEED [place]) [place] Agency: [place] ([place]) Date HH Agency Contacted: 03/07/19 Time [place]: 1131 Representative spoke with at [place]: [place] ([place]) Interventions    Readmission Risk Interventions Readmission Risk Prevention Plan 03/07/2019 01/04/2019 10/15/2018  Transportation Screening Complete Complete Complete  PCP or Specialist Appt within 5-7 Days - Complete -  PCP or Specialist Appt within 3-5 Days Complete - Not Complete  Not Complete comments - - not yet ready for d/c  Home Care Screening - Complete -  Medication Review (RN CM) - Complete -  [place] or Home Care Consult Complete - Complete  Social Work Consult for [place] Planning/Counseling Complete - Complete  Palliative Care Screening Not Applicable - Not Applicable   Medication Review (RN Care Manager) Referral to Pharmacy - Complete  Some recent data might be hidden
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
## 2019-03-12 NOTE — Progress Notes (Signed)
[name] [name] is a 53 y.o. male with paraplegia, chronic pressure ulcers with associated osteomyelitis, admitted for worsening drainage and imaging suggesting worsening burden of disease. Now s/p debridement of the following   03/11/19 -  [name] 1. Excision of back wounds 10 x 15 cm in total size of three wounds 2. Placement of Acell 1 gm powder and 10 x 15 cm sheet 3. Preparation of sacral wound 20 x 20 cm for placement of Acell powder 1 gm     Lab Results  Component Value Date   WBC 8.8 03/10/2019   HGB 8.5 (L) 03/10/2019   HCT 26.8 (L) 03/10/2019   MCV 80.5 03/10/2019   PLT 378 03/10/2019   Lab Results  Component Value Date   CREATININE <0.30 (L) 03/10/2019     A&P: Acute worsening of sacral osteomyelitis = now post debridement and Acell placement. Open and packed. Following with [name]. No cultures to guide, will plan 4 weeks merropenem given ESBL and PSA history in the past.  Will check CRP/ESR in AM to establish baseline for trending.   Mediation monitoring = weekly CBC, BMP  Access = PICC line when able for home antibiotic infusion   OPAT ORDERS: Osteomyelitis, acute on chronic in the setting of chronic pressure ulcers   Culture Result: none available; hx ESBL ecoli, PsA. No MRSA.   Allergies  Allergen Reactions  . Ferumoxytol Anaphylaxis and Other (See Comments)    (Feraheme) = "SYNCOPE; patient tolerated Venofer 10/01/18 s rxn"   . Oxybutynin Other (See Comments)    Hallucinations    . Vancomycin Other (See Comments)    ARF 05-2016 -- affects kidneys     Discharge antibiotics: Meropenem   Duration: 4 weeks  End Date: 04/09/19  [facility] Care and Maintenance Per Protocol __ Please pull PIC at completion of IV antibiotics _x_ Please leave PIC in place until doctor has seen patient or been notified  Labs weekly while on IV antibiotics: _x_ CBC with differential _x_ BMP __ BMP TWICE WEEKLY** __ CMP _x_ CRP _x_ ESR __ Vancomycin  trough  Fax weekly labs to [phone]   Clinic Follow Up Appt: Friday November 20 @ 11:00 a.m. with Janene Madeira, NP    Janene Madeira, MSN, NP-C [facility] [email or web address] Pager: [phone] Office: [phone] [place]: [phone]
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
## 2019-03-12 NOTE — Anesthesia Postprocedure Evaluation (Signed)
Anesthesia Post Note  Patient: [name] [name]  Procedure(s) Performed: Excision of back and sacral ulcers with placement of ACell (N/A Back)     Patient location during evaluation: PACU Anesthesia Type: General Level of consciousness: awake and alert Pain management: pain level controlled Vital Signs Assessment: post-procedure vital signs reviewed and stable Respiratory status: spontaneous breathing, nonlabored ventilation, respiratory function stable and patient connected to nasal cannula oxygen Cardiovascular status: blood pressure returned to baseline and stable Postop Assessment: no apparent nausea or vomiting Anesthetic complications: no    Last Vitals:  Vitals:   03/11/19 2014 03/12/19 0453  BP: (!) 85/54 99/61  Pulse: (!) 101 93  Resp: 18 16  Temp:    SpO2: 99% 100%    Last Pain:  Vitals:   03/11/19 2040  TempSrc:   PainSc: 0-No pain                 Lurlene Ronda S

## 2019-03-13 DIAGNOSIS — Z9359 Other cystostomy status: Secondary | ICD-10-CM

## 2019-03-13 DIAGNOSIS — L8995 Pressure ulcer of unspecified site, unstageable: Secondary | ICD-10-CM

## 2019-03-13 DIAGNOSIS — D638 Anemia in other chronic diseases classified elsewhere: Secondary | ICD-10-CM | POA: Diagnosis not present

## 2019-03-13 DIAGNOSIS — T07XXXA Unspecified multiple injuries, initial encounter: Secondary | ICD-10-CM | POA: Diagnosis not present

## 2019-03-13 DIAGNOSIS — M869 Osteomyelitis, unspecified: Secondary | ICD-10-CM | POA: Diagnosis not present

## 2019-03-13 LAB — SEDIMENTATION RATE: Sed Rate: 18 mm/hr — ABNORMAL HIGH (ref 0–16)

## 2019-03-13 LAB — C-REACTIVE PROTEIN: CRP: 5.7 mg/dL — ABNORMAL HIGH (ref <1.0)

## 2019-03-13 MED ORDER — HEPARIN SOD (PORK) LOCK FLUSH 100 UNIT/ML IV SOLN
500.0000 [IU] | INTRAVENOUS | Status: AC | PRN
  Administered 2019-03-13: 19:00:00 500 [IU]
  Filled 2019-03-13: qty 5

## 2019-03-13 MED ORDER — ENSURE MAX PROTEIN PO LIQD: 11.0000 [oz_av] | Freq: Every day | ORAL | 30 refills | Status: AC

## 2019-03-13 MED ORDER — ENSURE MAX PROTEIN PO LIQD
11.0000 [oz_av] | Freq: Every day | ORAL | Status: DC
  Filled 2019-03-13: qty 330

## 2019-03-13 MED ORDER — MEROPENEM IV (FOR PTA / DISCHARGE USE ONLY): 1.0000 g | Freq: Three times a day (TID) | INTRAVENOUS | 0 refills | Status: DC

## 2019-03-13 MED ORDER — JUVEN PO PACK: 1.0000 | PACK | Freq: Two times a day (BID) | ORAL | 3 refills | Status: AC

## 2019-03-13 NOTE — Discharge Instructions (Signed)
You were cared for by a hospitalist during your hospital stay. If you have any questions about your discharge medications or the care you received while you were in the hospital after you are discharged, you can call the unit and asked to speak with the hospitalist on call if the hospitalist that took care of you is not available. Once you are discharged, your primary care physician will handle any further medical issues.   Please note that NO REFILLS for any discharge medications will be authorized once you are discharged, as it is imperative that you return to your primary care physician (or establish a relationship with a primary care physician if you do not have one) for your aftercare needs so that they can reassess your need for medications and monitor your lab values.  Please take all your medications with you for your next visit with your Primary MD. Please ask your Primary MD to get all Hospital records sent to his/her office. Please request your Primary MD to go over all hospital test results at the follow up.   If you experience worsening of your admission symptoms, develop shortness of breath, chest pain, suicidal or homicidal thoughts or a life threatening emergency, you must seek medical attention immediately by calling 911 or calling your MD.   You must read the complete instructions/literature along with all the possible adverse reactions/side effects for all the medicines you take including new medications that have been prescribed to you. Take new medicines after you have completely understood and accpet all the possible adverse reactions/side effects.    Do not drive when taking pain medications or sedatives.     Do not take more than prescribed Pain, Sleep and Anxiety Medications   If you have smoked or chewed Tobacco in the last 2 yrs please stop. Stop any regular alcohol  and or recreational drug use.   Wear Seat belts while driving. 
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
## 2019-03-13 NOTE — TOC Transition Note (Signed)
Transition of Care [facility]) - CM/SW Discharge Note   Patient Details  Name: [name] [name] MRN: [id number] Date of Birth: August 15, 1965  Transition of Care [facility]) CM/SW Contact:  Alexander Mt, [address] [place] Phone Number: 03/13/2019, 1:38 PM   Clinical Narrative:    Pt stable for dc per MD notes.  Face to Face, orders etc noted.  Pt will dc home with HHRN through [facility] and infusion abx per [facility]. [name] with [facility] will come and see pt prior to dc (she is familiar w/ pt from previous discharges).   [facility] papers will be completed.    Final next level of care: [place] Barriers to Discharge: Barriers Resolved   Patient Goals and CMS Choice Patient states their goals for this hospitalization and ongoing recovery are:: to go home CMS Medicare.gov Compare Post Acute Care list provided to:: Patient Choice offered to / list presented to : Patient  Discharge Placement Patient to be transferred to facility by: [place] Name of family member notified: pt will tell his sister Patient and family notified of of transfer: 03/13/19  Discharge Plan and Services Post Acute Care Choice: Home Health          DME Arranged: N/A HH Arranged: RN(WILL [place]) [place] Agency: [place] ([facility] Date HH Agency Contacted: 03/07/19 & 03/13/19 Time HH Agency Contacted: 1131/ 13:30 Representative spoke with at [place]: Valerie/[name]  Social Determinants of Health ([place]) Interventions     Readmission Risk Interventions Readmission Risk Prevention Plan 03/07/2019 01/04/2019 10/15/2018  Transportation Screening Complete Complete Complete  PCP or Specialist Appt within 5-7 Days - Complete -  PCP or Specialist Appt within 3-5 Days Complete - Not Complete  Not Complete comments - - not yet ready for d/c  Home Care Screening - Complete -  Medication Review (RN CM) - Complete -  HRI or Home Care Consult Complete - Complete   Social Work Consult for [place] Planning/Counseling Complete - Complete  Palliative Care Screening Not Applicable - Not Applicable  Medication Review (RN Care Manager) Referral to Pharmacy - Complete  Some recent data might be hidden

## 2019-03-13 NOTE — Progress Notes (Signed)
2 Days Post-Op  Subjective: [name] [name] is doing well this AM. He reports nursing assisted him yesterday with dressing change. KY jelly to sorbact mesh.  Denies fever, chills, n/v. Resting in bed listening to music this AM on evaluation.  No complaints, d/c today per pt and managing teams note.  Objective: Vital signs in last 24 hours: Temp:  [98.6 F (37 C)-99.8 F (37.7 C)] 98.6 F (37 C) (10/27 2200) Pulse Rate:  [96-98] 96 (10/27 2200) Resp:  [17-18] 17 (10/27 2200) BP: (86-125)/(59-80) 125/80 (10/27 2200) SpO2:  [100 %] 100 % (10/27 2200) Last BM Date: 03/12/19  Intake/Output from previous day: 10/27 0701 - 10/28 0700 In: 480 [P.O.:480] Out: 2450 [Urine:2450] Intake/Output this shift: Total I/O In: 480 [P.O.:480] Out: -   General appearance: alert, cooperative, no distress and resting in bed Head: Normocephalic, without obvious abnormality, atraumatic Resp: unlabored Pulses: b/l UE pulses 2+ Extremities: Multiple contractures of UE and LE. Brace on left arm.  Incision/Wound: Wounds with ABD dressings in place. Changed by nursing yesterday.   Lab Results:  CBC    Component Value Date/Time   WBC 10.8 (H) 03/12/2019 1946   RBC 2.93 (L) 03/12/2019 1946   HGB 7.6 (L) 03/12/2019 1946   HGB 11.6 (L) 02/03/2017 1313   HCT 24.0 (L) 03/12/2019 1946   HCT 35.9 (L) 02/03/2017 1313   PLT 350 03/12/2019 1946   PLT 346 02/03/2017 1313   MCV 81.9 03/12/2019 1946   MCV 80.0 02/03/2017 1313   MCH 25.9 (L) 03/12/2019 1946   MCHC 31.7 03/12/2019 1946   RDW 17.3 (H) 03/12/2019 1946   RDW 15.0 (H) 02/03/2017 1313   LYMPHSABS 2.1 03/05/2019 1752   LYMPHSABS 1.6 02/03/2017 1313   MONOABS 1.5 (H) 03/05/2019 1752   MONOABS 0.9 02/03/2017 1313   EOSABS 0.0 03/05/2019 1752   EOSABS 0.5 02/03/2017 1313   BASOSABS 0.0 03/05/2019 1752   BASOSABS 0.1 02/03/2017 1313    BMET Recent Labs    03/12/19 1946  NA 134*  K 3.5  CL 105  CO2 23  GLUCOSE 107*  BUN 13  CREATININE  <0.30*  CALCIUM 7.3*   PT/INR No results for input(s): LABPROT, INR in the last 72 hours. ABG No results for input(s): PHART, HCO3 in the last 72 hours.  Invalid input(s): PCO2, PO2  Studies/Results: No results found.  Anti-infectives: Anti-infectives (From admission, onward)   Start     Dose/Rate Route Frequency Ordered Stop   03/12/19 1800  vancomycin (VANCOCIN) 1,500 mg in sodium chloride 0.9 % 500 mL IVPB  Status:  Discontinued     1,500 mg 250 mL/hr over 120 Minutes Intravenous Every 24 hours 03/11/19 1619 03/12/19 0949   03/11/19 1800  vancomycin (VANCOCIN) 2,000 mg in sodium chloride 0.9 % 500 mL IVPB     2,000 mg 250 mL/hr over 120 Minutes Intravenous  Once 03/11/19 1619 03/11/19 2036   03/11/19 1700  meropenem (MERREM) 1 g in sodium chloride 0.9 % 100 mL IVPB     1 g 200 mL/hr over 30 Minutes Intravenous Every 8 hours 03/11/19 1600     03/11/19 0800  ceFAZolin (ANCEF) IVPB 2g/100 mL premix     2 g 200 mL/hr over 30 Minutes Intravenous To [facility]Stay [facility] 03/10/19 2205 03/11/19 0812   03/11/19 0751  polymyxin B 500,000 Units, bacitracin 50,000 Units in sodium chloride 0.9 % 500 mL irrigation  Status:  Discontinued       As needed 03/11/19 0751 03/11/19 0910  03/06/19 1745  vancomycin (VANCOCIN) 1,500 mg in sodium chloride 0.9 % 500 mL IVPB  Status:  Discontinued     1,500 mg 250 mL/hr over 120 Minutes Intravenous Every 24 hours 03/05/19 1740 03/08/19 1625   03/05/19 1745  vancomycin (VANCOCIN) 2,000 mg in sodium chloride 0.9 % 500 mL IVPB     2,000 mg 250 mL/hr over 120 Minutes Intravenous  Once 03/05/19 1740 03/05/19 2136   03/05/19 1745  meropenem (MERREM) 1 g in sodium chloride 0.9 % 100 mL IVPB  Status:  Discontinued     1 g 200 mL/hr over 30 Minutes Intravenous Every 8 hours 03/05/19 1740 03/08/19 1625      Assessment/Plan: s/p Procedure(s): Excision of back and sacral ulcers with placement of ACell  Home health to remove sorbact mesh in 1-2 weeks pending  soil level. Daily KY jelly, 4x4 gauze, ABD, tape. Patient to receive wound care assistance at home from family, [employer] and wound care center.   Optimize healing with high protein, low carb/sugar diet. Multivitamin and Vitamin C would benefit patient.  Call with any questions or concerns.   LOS: 8 days    Charlies Constable, PA-C 03/13/2019
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
## 2019-03-13 NOTE — Progress Notes (Signed)
Communicated with MD and pt is ok to go home with his port accessed so he can administer iv antibiotics tonight

## 2019-03-13 NOTE — Consult Note (Signed)
   [facility] CM Inpatient Consult   03/13/2019  AVYAY NAKAYAMA May 20, 1965 [id number]   Patient was screened for 32% extreme high risk score for unplanned readmission and hospitalizations and to check for potentialTHN Care Management servicesneeds under hisBlueCross Blue Shieldinsurance plan.  Chart review shows that patient was admitted for Acute on chronic Osteomyelitis of sacrum (Decubitus ulcers and worsening chronic wounds), recurrent UTIs.  Called and spoke to office staff Charlena Cross) at PCP's office (Dr. [name] with [facility]) and she confirmed that patient is no longer seen in their practice but actively seen by Park Meo, Christmas with [place], who isnot a [facility] provider.  Patient isNOT currently a beneficiary of the attributed [place] in the [employer] and NOT currently covered for [place].  Reason: Patient's current Primary Care Provider is not a [facility] provider and not affiliated with [employer]   For questions, please contact:  Edwena Felty A. Abbigaile Rockman, BSN, RN-BC [facility] Liaison Cell: [phone]

## 2019-03-14 NOTE — Progress Notes (Signed)
Pt. Discharged home.  [facility] service to transport patient home.  Patient sister on the phone with patient and aware that Mr. Bielec is on his way home.  AVS placed in Discharge packet.  Prescription also placed in Discharge packet.  Patient had his cell phone, cell charger with him on discharge.  Patient's batteries and wound care supplies placed in patient care bag and given to [facility] for transport home.

## 2019-03-18 ENCOUNTER — Other Ambulatory Visit: Payer: Self-pay | Admitting: Internal Medicine

## 2019-03-18 DIAGNOSIS — R05 Cough: Secondary | ICD-10-CM | POA: Diagnosis not present

## 2019-03-19 ENCOUNTER — Inpatient Hospital Stay (HOSPITAL_COMMUNITY)
Admission: EM | Admit: 2019-03-19 | Discharge: 2019-03-28 | DRG: 871 | Disposition: A | Discharge status: Home Health | Payer: Medicare Other | Attending: Family Medicine | Admitting: Family Medicine

## 2019-03-19 ENCOUNTER — Other Ambulatory Visit: Payer: Self-pay

## 2019-03-19 ENCOUNTER — Emergency Department (HOSPITAL_COMMUNITY): Payer: Medicare Other

## 2019-03-19 ENCOUNTER — Encounter (HOSPITAL_COMMUNITY): Payer: Self-pay | Admitting: Emergency Medicine

## 2019-03-19 DIAGNOSIS — Z833 Family history of diabetes mellitus: Secondary | ICD-10-CM

## 2019-03-19 DIAGNOSIS — L89224 Pressure ulcer of left hip, stage 4: Secondary | ICD-10-CM | POA: Diagnosis not present

## 2019-03-19 DIAGNOSIS — E669 Obesity, unspecified: Secondary | ICD-10-CM | POA: Diagnosis present

## 2019-03-19 DIAGNOSIS — Z9049 Acquired absence of other specified parts of digestive tract: Secondary | ICD-10-CM

## 2019-03-19 DIAGNOSIS — N390 Urinary tract infection, site not specified: Secondary | ICD-10-CM | POA: Diagnosis present

## 2019-03-19 DIAGNOSIS — Z20828 Contact with and (suspected) exposure to other viral communicable diseases: Secondary | ICD-10-CM | POA: Diagnosis not present

## 2019-03-19 DIAGNOSIS — M2550 Pain in unspecified joint: Secondary | ICD-10-CM | POA: Diagnosis not present

## 2019-03-19 DIAGNOSIS — L89114 Pressure ulcer of right upper back, stage 4: Secondary | ICD-10-CM | POA: Diagnosis not present

## 2019-03-19 DIAGNOSIS — Z9989 Dependence on other enabling machines and devices: Secondary | ICD-10-CM

## 2019-03-19 DIAGNOSIS — R0689 Other abnormalities of breathing: Secondary | ICD-10-CM | POA: Diagnosis not present

## 2019-03-19 DIAGNOSIS — Z6833 Body mass index (BMI) 33.0-33.9, adult: Secondary | ICD-10-CM

## 2019-03-19 DIAGNOSIS — K56609 Unspecified intestinal obstruction, unspecified as to partial versus complete obstruction: Secondary | ICD-10-CM | POA: Diagnosis not present

## 2019-03-19 DIAGNOSIS — A419 Sepsis, unspecified organism: Secondary | ICD-10-CM | POA: Diagnosis not present

## 2019-03-19 DIAGNOSIS — Z95828 Presence of other vascular implants and grafts: Secondary | ICD-10-CM

## 2019-03-19 DIAGNOSIS — L89154 Pressure ulcer of sacral region, stage 4: Secondary | ICD-10-CM | POA: Diagnosis not present

## 2019-03-19 DIAGNOSIS — D638 Anemia in other chronic diseases classified elsewhere: Secondary | ICD-10-CM | POA: Diagnosis not present

## 2019-03-19 DIAGNOSIS — R0602 Shortness of breath: Secondary | ICD-10-CM

## 2019-03-19 DIAGNOSIS — L89214 Pressure ulcer of right hip, stage 4: Secondary | ICD-10-CM | POA: Diagnosis not present

## 2019-03-19 DIAGNOSIS — Z888 Allergy status to other drugs, medicaments and biological substances status: Secondary | ICD-10-CM

## 2019-03-19 DIAGNOSIS — R7881 Bacteremia: Secondary | ICD-10-CM | POA: Diagnosis not present

## 2019-03-19 DIAGNOSIS — E162 Hypoglycemia, unspecified: Secondary | ICD-10-CM | POA: Diagnosis not present

## 2019-03-19 DIAGNOSIS — L89110 Pressure ulcer of right upper back, unstageable: Secondary | ICD-10-CM | POA: Diagnosis present

## 2019-03-19 DIAGNOSIS — R109 Unspecified abdominal pain: Secondary | ICD-10-CM | POA: Diagnosis not present

## 2019-03-19 DIAGNOSIS — Z881 Allergy status to other antibiotic agents status: Secondary | ICD-10-CM | POA: Diagnosis not present

## 2019-03-19 DIAGNOSIS — M4628 Osteomyelitis of vertebra, sacral and sacrococcygeal region: Secondary | ICD-10-CM | POA: Diagnosis not present

## 2019-03-19 DIAGNOSIS — I9589 Other hypotension: Secondary | ICD-10-CM | POA: Diagnosis present

## 2019-03-19 DIAGNOSIS — Z7401 Bed confinement status: Secondary | ICD-10-CM | POA: Diagnosis not present

## 2019-03-19 DIAGNOSIS — R05 Cough: Secondary | ICD-10-CM

## 2019-03-19 DIAGNOSIS — Z803 Family history of malignant neoplasm of breast: Secondary | ICD-10-CM

## 2019-03-19 DIAGNOSIS — K567 Ileus, unspecified: Secondary | ICD-10-CM | POA: Diagnosis not present

## 2019-03-19 DIAGNOSIS — Z79899 Other long term (current) drug therapy: Secondary | ICD-10-CM

## 2019-03-19 DIAGNOSIS — L89616 Pressure-induced deep tissue damage of right heel: Secondary | ICD-10-CM | POA: Diagnosis not present

## 2019-03-19 DIAGNOSIS — I10 Essential (primary) hypertension: Secondary | ICD-10-CM | POA: Diagnosis present

## 2019-03-19 DIAGNOSIS — Z87440 Personal history of urinary (tract) infections: Secondary | ICD-10-CM | POA: Diagnosis not present

## 2019-03-19 DIAGNOSIS — J9610 Chronic respiratory failure, unspecified whether with hypoxia or hypercapnia: Secondary | ICD-10-CM | POA: Diagnosis not present

## 2019-03-19 DIAGNOSIS — L89314 Pressure ulcer of right buttock, stage 4: Secondary | ICD-10-CM | POA: Diagnosis present

## 2019-03-19 DIAGNOSIS — R509 Fever, unspecified: Secondary | ICD-10-CM | POA: Diagnosis not present

## 2019-03-19 DIAGNOSIS — Z933 Colostomy status: Secondary | ICD-10-CM

## 2019-03-19 DIAGNOSIS — B9689 Other specified bacterial agents as the cause of diseases classified elsewhere: Secondary | ICD-10-CM | POA: Diagnosis not present

## 2019-03-19 DIAGNOSIS — Z209 Contact with and (suspected) exposure to unspecified communicable disease: Secondary | ICD-10-CM | POA: Diagnosis not present

## 2019-03-19 DIAGNOSIS — F329 Major depressive disorder, single episode, unspecified: Secondary | ICD-10-CM | POA: Diagnosis present

## 2019-03-19 DIAGNOSIS — Z8711 Personal history of peptic ulcer disease: Secondary | ICD-10-CM

## 2019-03-19 DIAGNOSIS — R000 Tachycardia, unspecified: Secondary | ICD-10-CM | POA: Diagnosis present

## 2019-03-19 DIAGNOSIS — R059 Cough, unspecified: Secondary | ICD-10-CM

## 2019-03-19 DIAGNOSIS — L89119 Pressure ulcer of right upper back, unspecified stage: Secondary | ICD-10-CM | POA: Diagnosis present

## 2019-03-19 DIAGNOSIS — Z9359 Other cystostomy status: Secondary | ICD-10-CM

## 2019-03-19 DIAGNOSIS — L02212 Cutaneous abscess of back [any part, except buttock]: Secondary | ICD-10-CM | POA: Diagnosis not present

## 2019-03-19 DIAGNOSIS — L8992 Pressure ulcer of unspecified site, stage 2: Secondary | ICD-10-CM | POA: Diagnosis present

## 2019-03-19 DIAGNOSIS — T07XXXA Unspecified multiple injuries, initial encounter: Secondary | ICD-10-CM | POA: Diagnosis present

## 2019-03-19 DIAGNOSIS — G4733 Obstructive sleep apnea (adult) (pediatric): Secondary | ICD-10-CM | POA: Diagnosis present

## 2019-03-19 DIAGNOSIS — G8253 Quadriplegia, C5-C7 complete: Secondary | ICD-10-CM | POA: Diagnosis not present

## 2019-03-19 DIAGNOSIS — L89103 Pressure ulcer of unspecified part of back, stage 3: Secondary | ICD-10-CM | POA: Diagnosis present

## 2019-03-19 DIAGNOSIS — E876 Hypokalemia: Secondary | ICD-10-CM | POA: Diagnosis present

## 2019-03-19 DIAGNOSIS — L89223 Pressure ulcer of left hip, stage 3: Secondary | ICD-10-CM | POA: Diagnosis not present

## 2019-03-19 DIAGNOSIS — R0902 Hypoxemia: Secondary | ICD-10-CM | POA: Diagnosis not present

## 2019-03-19 DIAGNOSIS — R140 Abdominal distension (gaseous): Secondary | ICD-10-CM

## 2019-03-19 DIAGNOSIS — Z1612 Extended spectrum beta lactamase (ESBL) resistance: Secondary | ICD-10-CM | POA: Diagnosis not present

## 2019-03-19 DIAGNOSIS — A4159 Other Gram-negative sepsis: Principal | ICD-10-CM | POA: Diagnosis present

## 2019-03-19 DIAGNOSIS — Z0189 Encounter for other specified special examinations: Secondary | ICD-10-CM

## 2019-03-19 DIAGNOSIS — M869 Osteomyelitis, unspecified: Secondary | ICD-10-CM | POA: Diagnosis not present

## 2019-03-19 DIAGNOSIS — B9620 Unspecified Escherichia coli [E. coli] as the cause of diseases classified elsewhere: Secondary | ICD-10-CM | POA: Diagnosis not present

## 2019-03-19 DIAGNOSIS — G8250 Quadriplegia, unspecified: Secondary | ICD-10-CM | POA: Diagnosis present

## 2019-03-19 DIAGNOSIS — Z8701 Personal history of pneumonia (recurrent): Secondary | ICD-10-CM

## 2019-03-19 DIAGNOSIS — Z4682 Encounter for fitting and adjustment of non-vascular catheter: Secondary | ICD-10-CM | POA: Diagnosis not present

## 2019-03-19 DIAGNOSIS — K219 Gastro-esophageal reflux disease without esophagitis: Secondary | ICD-10-CM | POA: Diagnosis present

## 2019-03-19 DIAGNOSIS — Z960 Presence of urogenital implants: Secondary | ICD-10-CM | POA: Diagnosis not present

## 2019-03-19 DIAGNOSIS — R5381 Other malaise: Secondary | ICD-10-CM | POA: Diagnosis not present

## 2019-03-19 LAB — APTT: aPTT: 131 seconds — ABNORMAL HIGH (ref 24–36)

## 2019-03-19 LAB — URINALYSIS, ROUTINE W REFLEX MICROSCOPIC
Bilirubin Urine: NEGATIVE
Glucose, UA: NEGATIVE mg/dL
Hgb urine dipstick: NEGATIVE
Ketones, ur: 20 mg/dL — AB
Nitrite: NEGATIVE
Protein, ur: NEGATIVE mg/dL
Specific Gravity, Urine: 1.011 (ref 1.005–1.030)
pH: 7 (ref 5.0–8.0)

## 2019-03-19 LAB — CBC WITH DIFFERENTIAL/PLATELET
Abs Immature Granulocytes: 0.02 10*3/uL (ref 0.00–0.07)
Basophils Absolute: 0 10*3/uL (ref 0.0–0.1)
Basophils Relative: 1 %
Eosinophils Absolute: 0.1 10*3/uL (ref 0.0–0.5)
Eosinophils Relative: 1 %
HCT: 23.1 % — ABNORMAL LOW (ref 39.0–52.0)
Hemoglobin: 7.1 g/dL — ABNORMAL LOW (ref 13.0–17.0)
Immature Granulocytes: 0 %
Lymphocytes Relative: 14 %
Lymphs Abs: 0.9 10*3/uL (ref 0.7–4.0)
MCH: 25.6 pg — ABNORMAL LOW (ref 26.0–34.0)
MCHC: 30.7 g/dL (ref 30.0–36.0)
MCV: 83.4 fL (ref 80.0–100.0)
Monocytes Absolute: 0.7 10*3/uL (ref 0.1–1.0)
Monocytes Relative: 12 %
Neutro Abs: 4.4 10*3/uL (ref 1.7–7.7)
Neutrophils Relative %: 72 %
Platelets: 401 10*3/uL — ABNORMAL HIGH (ref 150–400)
RBC: 2.77 MIL/uL — ABNORMAL LOW (ref 4.22–5.81)
RDW: 17.8 % — ABNORMAL HIGH (ref 11.5–15.5)
WBC: 6.2 10*3/uL (ref 4.0–10.5)
nRBC: 0 % (ref 0.0–0.2)

## 2019-03-19 LAB — COMPREHENSIVE METABOLIC PANEL
ALT: 16 U/L (ref 0–44)
AST: 22 U/L (ref 15–41)
Albumin: 1.3 g/dL — ABNORMAL LOW (ref 3.5–5.0)
Alkaline Phosphatase: 76 U/L (ref 38–126)
Anion gap: 9 (ref 5–15)
BUN: 8 mg/dL (ref 6–20)
CO2: 25 mmol/L (ref 22–32)
Calcium: 7 mg/dL — ABNORMAL LOW (ref 8.9–10.3)
Chloride: 103 mmol/L (ref 98–111)
Creatinine, Ser: 0.34 mg/dL — ABNORMAL LOW (ref 0.61–1.24)
GFR calc Af Amer: >60 mL/min (ref >60)
GFR calc non Af Amer: >60 mL/min (ref >60)
Glucose, Bld: 71 mg/dL (ref 70–99)
Potassium: 3.9 mmol/L (ref 3.5–5.1)
Sodium: 137 mmol/L (ref 135–145)
Total Bilirubin: 0.4 mg/dL (ref 0.3–1.2)
Total Protein: 4.8 g/dL — ABNORMAL LOW (ref 6.5–8.1)

## 2019-03-19 LAB — LACTIC ACID, PLASMA: Lactic Acid, Venous: 0.7 mmol/L (ref 0.5–1.9)

## 2019-03-19 LAB — SARS CORONAVIRUS 2 BY RT PCR (HOSPITAL ORDER, PERFORMED IN ~~LOC~~ HOSPITAL LAB): SARS Coronavirus 2: NEGATIVE

## 2019-03-19 LAB — PROTIME-INR
INR: 1.2 (ref 0.8–1.2)
Prothrombin Time: 14.7 seconds (ref 11.4–15.2)

## 2019-03-19 MED ORDER — SACCHAROMYCES BOULARDII 250 MG PO CAPS
250.0000 mg | ORAL_CAPSULE | Freq: Every day | ORAL | Status: DC
  Administered 2019-03-20 – 2019-03-27 (×5): 250 mg via ORAL
  Filled 2019-03-19 (×6): qty 1

## 2019-03-19 MED ORDER — HEPARIN SODIUM (PORCINE) 5000 UNIT/ML IJ SOLN
5000.0000 [IU] | Freq: Three times a day (TID) | INTRAMUSCULAR | Status: DC
  Administered 2019-03-19 – 2019-03-20 (×2): 5000 [IU] via SUBCUTANEOUS
  Filled 2019-03-19 (×2): qty 1

## 2019-03-19 MED ORDER — SUCRALFATE 1 G PO TABS
1.0000 g | ORAL_TABLET | Freq: Four times a day (QID) | ORAL | Status: DC
  Administered 2019-03-19 – 2019-03-27 (×18): 1 g via ORAL
  Filled 2019-03-19 (×21): qty 1

## 2019-03-19 MED ORDER — METOCLOPRAMIDE HCL 10 MG PO TABS
10.0000 mg | ORAL_TABLET | Freq: Three times a day (TID) | ORAL | Status: DC
  Administered 2019-03-20 – 2019-03-27 (×9): 10 mg via ORAL
  Filled 2019-03-19 (×13): qty 1

## 2019-03-19 MED ORDER — JUVEN PO PACK
1.0000 | PACK | Freq: Two times a day (BID) | ORAL | Status: DC
  Administered 2019-03-20 – 2019-03-27 (×3): 1 via ORAL
  Filled 2019-03-19 (×16): qty 1

## 2019-03-19 MED ORDER — ADULT MULTIVITAMIN W/MINERALS CH
1.0000 | ORAL_TABLET | Freq: Every morning | ORAL | Status: DC
  Administered 2019-03-20 – 2019-03-27 (×5): 1 via ORAL
  Filled 2019-03-19 (×6): qty 1

## 2019-03-19 MED ORDER — FERROUS SULFATE 325 (65 FE) MG PO TABS
325.0000 mg | ORAL_TABLET | Freq: Every day | ORAL | Status: DC
  Administered 2019-03-20 – 2019-03-27 (×5): 325 mg via ORAL
  Filled 2019-03-19 (×6): qty 1

## 2019-03-19 MED ORDER — ONDANSETRON 4 MG PO TBDP
8.0000 mg | ORAL_TABLET | Freq: Three times a day (TID) | ORAL | Status: DC | PRN
  Administered 2019-03-20: 8 mg via ORAL
  Filled 2019-03-19: qty 2

## 2019-03-19 MED ORDER — SODIUM CHLORIDE 0.9 % IV BOLUS (SEPSIS)
1000.0000 mL | Freq: Once | INTRAVENOUS | Status: AC
  Administered 2019-03-19: 1000 mL via INTRAVENOUS

## 2019-03-19 MED ORDER — ZINC SULFATE 220 (50 ZN) MG PO CAPS
220.0000 mg | ORAL_CAPSULE | Freq: Two times a day (BID) | ORAL | Status: DC
  Administered 2019-03-19 – 2019-03-27 (×11): 220 mg via ORAL
  Filled 2019-03-19 (×17): qty 1

## 2019-03-19 MED ORDER — PANTOPRAZOLE SODIUM 40 MG PO TBEC
40.0000 mg | DELAYED_RELEASE_TABLET | Freq: Two times a day (BID) | ORAL | Status: DC
  Administered 2019-03-19 – 2019-03-27 (×11): 40 mg via ORAL
  Filled 2019-03-19 (×13): qty 1

## 2019-03-19 MED ORDER — SIMETHICONE 80 MG PO CHEW
80.0000 mg | CHEWABLE_TABLET | Freq: Four times a day (QID) | ORAL | Status: DC | PRN
  Administered 2019-03-20: 80 mg via ORAL
  Filled 2019-03-19: qty 1

## 2019-03-19 MED ORDER — POTASSIUM CHLORIDE CRYS ER 20 MEQ PO TBCR
40.0000 meq | EXTENDED_RELEASE_TABLET | Freq: Every day | ORAL | Status: DC
  Administered 2019-03-20 – 2019-03-21 (×2): 40 meq via ORAL
  Filled 2019-03-19 (×2): qty 2

## 2019-03-19 MED ORDER — FESOTERODINE FUMARATE ER 8 MG PO TB24
8.0000 mg | ORAL_TABLET | Freq: Every morning | ORAL | Status: DC
  Administered 2019-03-20 – 2019-03-27 (×5): 8 mg via ORAL
  Filled 2019-03-19 (×8): qty 1

## 2019-03-19 MED ORDER — SODIUM CHLORIDE 0.9 % IV SOLN
1.0000 g | Freq: Three times a day (TID) | INTRAVENOUS | Status: DC
  Administered 2019-03-19 – 2019-03-27 (×26): 1 g via INTRAVENOUS
  Filled 2019-03-19 (×28): qty 1

## 2019-03-19 MED ORDER — VITAMIN C 500 MG PO TABS
500.0000 mg | ORAL_TABLET | Freq: Every day | ORAL | Status: DC
  Administered 2019-03-20 – 2019-03-27 (×5): 500 mg via ORAL
  Filled 2019-03-19 (×6): qty 1

## 2019-03-19 MED ORDER — BACLOFEN 10 MG PO TABS
20.0000 mg | ORAL_TABLET | Freq: Two times a day (BID) | ORAL | Status: DC
  Administered 2019-03-19 – 2019-03-27 (×11): 20 mg via ORAL
  Filled 2019-03-19 (×13): qty 2

## 2019-03-19 MED ORDER — MAGNESIUM OXIDE 400 (241.3 MG) MG PO TABS
400.0000 mg | ORAL_TABLET | Freq: Every day | ORAL | Status: DC
  Administered 2019-03-20 – 2019-03-27 (×5): 400 mg via ORAL
  Filled 2019-03-19 (×6): qty 1

## 2019-03-19 MED ORDER — ALBUTEROL SULFATE (2.5 MG/3ML) 0.083% IN NEBU
3.0000 mL | INHALATION_SOLUTION | Freq: Four times a day (QID) | RESPIRATORY_TRACT | Status: DC | PRN
  Administered 2019-03-20: 3 mL via RESPIRATORY_TRACT
  Filled 2019-03-19: qty 3

## 2019-03-19 MED ORDER — ENSURE MAX PROTEIN PO LIQD
11.0000 [oz_av] | Freq: Every day | ORAL | Status: DC
  Administered 2019-03-20: 11 [oz_av] via ORAL
  Filled 2019-03-19 (×2): qty 330

## 2019-03-19 MED ORDER — MEROPENEM IV (FOR PTA / DISCHARGE USE ONLY): 1.0000 g | Freq: Three times a day (TID) | INTRAVENOUS | Status: DC

## 2019-03-19 NOTE — ED Triage Notes (Signed)
Per EMS Pt has had a fever since Sunday. Home health nurse was concerned after getting temp of 100. EMS temp 99.2 on arrival. No meds given in route. Discharged recently for UTI. Pt still on antibiotics.  Ems vitals CBG 92 Spo2 92% RA, 98% on 2 L BP 108/72  A/o x4. Port accessed on R chest.
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
## 2019-03-19 NOTE — Progress Notes (Signed)
Notified MD on call abt pt BP 89/58. Pt showing not other symptoms. No new orders received. Will continue to monitor.
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
## 2019-03-19 NOTE — Progress Notes (Signed)
PIV consult: Discussed with RN, pt historically has been difficult to stick. Please use port. If unable to use port, please re-enter consult. Please call phlebotomy for labs.

## 2019-03-20 ENCOUNTER — Encounter (HOSPITAL_COMMUNITY): Payer: Self-pay | Admitting: General Practice

## 2019-03-20 DIAGNOSIS — J9610 Chronic respiratory failure, unspecified whether with hypoxia or hypercapnia: Secondary | ICD-10-CM | POA: Diagnosis present

## 2019-03-20 DIAGNOSIS — B9689 Other specified bacterial agents as the cause of diseases classified elsewhere: Secondary | ICD-10-CM | POA: Diagnosis not present

## 2019-03-20 DIAGNOSIS — L02212 Cutaneous abscess of back [any part, except buttock]: Secondary | ICD-10-CM | POA: Diagnosis not present

## 2019-03-20 DIAGNOSIS — Z87440 Personal history of urinary (tract) infections: Secondary | ICD-10-CM

## 2019-03-20 DIAGNOSIS — Z888 Allergy status to other drugs, medicaments and biological substances status: Secondary | ICD-10-CM

## 2019-03-20 DIAGNOSIS — Z1612 Extended spectrum beta lactamase (ESBL) resistance: Secondary | ICD-10-CM

## 2019-03-20 DIAGNOSIS — L89154 Pressure ulcer of sacral region, stage 4: Secondary | ICD-10-CM

## 2019-03-20 DIAGNOSIS — T07XXXA Unspecified multiple injuries, initial encounter: Secondary | ICD-10-CM | POA: Diagnosis not present

## 2019-03-20 DIAGNOSIS — L89314 Pressure ulcer of right buttock, stage 4: Secondary | ICD-10-CM | POA: Diagnosis present

## 2019-03-20 DIAGNOSIS — G8253 Quadriplegia, C5-C7 complete: Secondary | ICD-10-CM | POA: Diagnosis present

## 2019-03-20 DIAGNOSIS — L89223 Pressure ulcer of left hip, stage 3: Secondary | ICD-10-CM | POA: Diagnosis present

## 2019-03-20 DIAGNOSIS — G4733 Obstructive sleep apnea (adult) (pediatric): Secondary | ICD-10-CM | POA: Diagnosis present

## 2019-03-20 DIAGNOSIS — L89110 Pressure ulcer of right upper back, unstageable: Secondary | ICD-10-CM | POA: Diagnosis present

## 2019-03-20 DIAGNOSIS — A419 Sepsis, unspecified organism: Secondary | ICD-10-CM | POA: Diagnosis not present

## 2019-03-20 DIAGNOSIS — R7881 Bacteremia: Secondary | ICD-10-CM

## 2019-03-20 DIAGNOSIS — L89224 Pressure ulcer of left hip, stage 4: Secondary | ICD-10-CM | POA: Diagnosis present

## 2019-03-20 DIAGNOSIS — K567 Ileus, unspecified: Secondary | ICD-10-CM | POA: Diagnosis not present

## 2019-03-20 DIAGNOSIS — Z960 Presence of urogenital implants: Secondary | ICD-10-CM

## 2019-03-20 DIAGNOSIS — D638 Anemia in other chronic diseases classified elsewhere: Secondary | ICD-10-CM | POA: Diagnosis present

## 2019-03-20 DIAGNOSIS — M4628 Osteomyelitis of vertebra, sacral and sacrococcygeal region: Secondary | ICD-10-CM | POA: Diagnosis present

## 2019-03-20 DIAGNOSIS — B9620 Unspecified Escherichia coli [E. coli] as the cause of diseases classified elsewhere: Secondary | ICD-10-CM

## 2019-03-20 DIAGNOSIS — Z20828 Contact with and (suspected) exposure to other viral communicable diseases: Secondary | ICD-10-CM | POA: Diagnosis present

## 2019-03-20 DIAGNOSIS — I9589 Other hypotension: Secondary | ICD-10-CM | POA: Diagnosis present

## 2019-03-20 DIAGNOSIS — A4159 Other Gram-negative sepsis: Secondary | ICD-10-CM | POA: Diagnosis present

## 2019-03-20 DIAGNOSIS — L89214 Pressure ulcer of right hip, stage 4: Secondary | ICD-10-CM | POA: Diagnosis present

## 2019-03-20 DIAGNOSIS — K56609 Unspecified intestinal obstruction, unspecified as to partial versus complete obstruction: Secondary | ICD-10-CM | POA: Diagnosis not present

## 2019-03-20 DIAGNOSIS — L89114 Pressure ulcer of right upper back, stage 4: Secondary | ICD-10-CM | POA: Diagnosis present

## 2019-03-20 DIAGNOSIS — L89616 Pressure-induced deep tissue damage of right heel: Secondary | ICD-10-CM | POA: Diagnosis present

## 2019-03-20 DIAGNOSIS — L89103 Pressure ulcer of unspecified part of back, stage 3: Secondary | ICD-10-CM | POA: Diagnosis present

## 2019-03-20 DIAGNOSIS — F329 Major depressive disorder, single episode, unspecified: Secondary | ICD-10-CM | POA: Diagnosis present

## 2019-03-20 DIAGNOSIS — N390 Urinary tract infection, site not specified: Secondary | ICD-10-CM | POA: Diagnosis present

## 2019-03-20 DIAGNOSIS — G8250 Quadriplegia, unspecified: Secondary | ICD-10-CM

## 2019-03-20 DIAGNOSIS — K219 Gastro-esophageal reflux disease without esophagitis: Secondary | ICD-10-CM | POA: Diagnosis present

## 2019-03-20 DIAGNOSIS — L8992 Pressure ulcer of unspecified site, stage 2: Secondary | ICD-10-CM | POA: Diagnosis present

## 2019-03-20 DIAGNOSIS — Z881 Allergy status to other antibiotic agents status: Secondary | ICD-10-CM

## 2019-03-20 LAB — CBC WITH DIFFERENTIAL/PLATELET
Abs Immature Granulocytes: 0.01 10*3/uL (ref 0.00–0.07)
Basophils Absolute: 0 10*3/uL (ref 0.0–0.1)
Basophils Relative: 0 %
Eosinophils Absolute: 0.1 10*3/uL (ref 0.0–0.5)
Eosinophils Relative: 3 %
HCT: 22.1 % — ABNORMAL LOW (ref 39.0–52.0)
Hemoglobin: 6.9 g/dL — CL (ref 13.0–17.0)
Immature Granulocytes: 0 %
Lymphocytes Relative: 19 %
Lymphs Abs: 1 10*3/uL (ref 0.7–4.0)
MCH: 26.1 pg (ref 26.0–34.0)
MCHC: 31.2 g/dL (ref 30.0–36.0)
MCV: 83.7 fL (ref 80.0–100.0)
Monocytes Absolute: 0.8 10*3/uL (ref 0.1–1.0)
Monocytes Relative: 16 %
Neutro Abs: 3.2 10*3/uL (ref 1.7–7.7)
Neutrophils Relative %: 62 %
Platelets: 357 10*3/uL (ref 150–400)
RBC: 2.64 MIL/uL — ABNORMAL LOW (ref 4.22–5.81)
RDW: 17.5 % — ABNORMAL HIGH (ref 11.5–15.5)
WBC: 5.1 10*3/uL (ref 4.0–10.5)
nRBC: 0 % (ref 0.0–0.2)

## 2019-03-20 LAB — BLOOD CULTURE ID PANEL (REFLEXED)
Acinetobacter baumannii: DETECTED — AB
Candida albicans: NOT DETECTED
Candida glabrata: NOT DETECTED
Candida krusei: NOT DETECTED
Candida parapsilosis: NOT DETECTED
Candida tropicalis: NOT DETECTED
Carbapenem resistance: NOT DETECTED
Enterobacter cloacae complex: NOT DETECTED
Enterobacteriaceae species: NOT DETECTED
Enterococcus species: NOT DETECTED
Escherichia coli: NOT DETECTED
Haemophilus influenzae: NOT DETECTED
Klebsiella oxytoca: NOT DETECTED
Klebsiella pneumoniae: NOT DETECTED
Listeria monocytogenes: NOT DETECTED
Neisseria meningitidis: NOT DETECTED
Proteus species: NOT DETECTED
Pseudomonas aeruginosa: NOT DETECTED
Serratia marcescens: NOT DETECTED
Staphylococcus aureus (BCID): NOT DETECTED
Staphylococcus species: NOT DETECTED
Streptococcus agalactiae: NOT DETECTED
Streptococcus pneumoniae: NOT DETECTED
Streptococcus pyogenes: NOT DETECTED
Streptococcus species: NOT DETECTED

## 2019-03-20 LAB — URINE CULTURE: Culture: NO GROWTH

## 2019-03-20 LAB — PHOSPHORUS: Phosphorus: 2.7 mg/dL (ref 2.5–4.6)

## 2019-03-20 LAB — MAGNESIUM: Magnesium: 1.3 mg/dL — ABNORMAL LOW (ref 1.7–2.4)

## 2019-03-20 LAB — BASIC METABOLIC PANEL
Anion gap: 9 (ref 5–15)
BUN: 6 mg/dL (ref 6–20)
CO2: 23 mmol/L (ref 22–32)
Calcium: 7 mg/dL — ABNORMAL LOW (ref 8.9–10.3)
Chloride: 105 mmol/L (ref 98–111)
Creatinine, Ser: 0.32 mg/dL — ABNORMAL LOW (ref 0.61–1.24)
GFR calc Af Amer: >60 mL/min (ref >60)
GFR calc non Af Amer: >60 mL/min (ref >60)
Glucose, Bld: 67 mg/dL — ABNORMAL LOW (ref 70–99)
Potassium: 3.4 mmol/L — ABNORMAL LOW (ref 3.5–5.1)
Sodium: 137 mmol/L (ref 135–145)

## 2019-03-20 LAB — HEMOGLOBIN AND HEMATOCRIT, BLOOD
HCT: 29.9 % — ABNORMAL LOW (ref 39.0–52.0)
Hemoglobin: 9.4 g/dL — ABNORMAL LOW (ref 13.0–17.0)

## 2019-03-20 LAB — PREPARE RBC (CROSSMATCH)

## 2019-03-20 LAB — MRSA PCR SCREENING: MRSA by PCR: POSITIVE — AB

## 2019-03-20 MED ORDER — FUROSEMIDE 10 MG/ML IJ SOLN
20.0000 mg | Freq: Once | INTRAMUSCULAR | Status: AC
  Administered 2019-03-20: 20 mg via INTRAVENOUS
  Filled 2019-03-20: qty 2

## 2019-03-20 MED ORDER — GUAIFENESIN 100 MG/5ML PO SOLN
5.0000 mL | ORAL | Status: DC | PRN
  Administered 2019-03-20 – 2019-03-27 (×3): 100 mg via ORAL
  Filled 2019-03-20 (×3): qty 10

## 2019-03-20 MED ORDER — MAGNESIUM SULFATE 2 GM/50ML IV SOLN
2.0000 g | Freq: Once | INTRAVENOUS | Status: AC
  Administered 2019-03-20: 2 g via INTRAVENOUS
  Filled 2019-03-20: qty 50

## 2019-03-20 MED ORDER — SODIUM CHLORIDE 0.9% IV SOLUTION: Freq: Once | INTRAVENOUS | Status: DC

## 2019-03-20 MED ORDER — COLLAGENASE 250 UNIT/GM EX OINT
TOPICAL_OINTMENT | Freq: Every day | CUTANEOUS | Status: DC
  Administered 2019-03-20 – 2019-03-22 (×3): via TOPICAL
  Administered 2019-03-24: 1 via TOPICAL
  Administered 2019-03-25 – 2019-03-27 (×3): via TOPICAL
  Filled 2019-03-20: qty 30

## 2019-03-20 MED ORDER — CHLORHEXIDINE GLUCONATE CLOTH 2 % EX PADS
6.0000 | MEDICATED_PAD | Freq: Every day | CUTANEOUS | Status: DC
  Administered 2019-03-20 – 2019-03-26 (×7): 6 via TOPICAL

## 2019-03-20 MED ORDER — PROMETHAZINE HCL 25 MG PO TABS
25.0000 mg | ORAL_TABLET | Freq: Three times a day (TID) | ORAL | Status: DC | PRN
  Administered 2019-03-20 – 2019-03-22 (×2): 25 mg via ORAL
  Filled 2019-03-20 (×2): qty 1
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
## 2019-03-20 NOTE — Consult Note (Addendum)
[place] Nurse wound consult note  Reason for Consult: Pt is familiar to [facility] team from multiple previous admissions.  He has multiple chronic pressure injuries and has been followed by the Plastic surgery team in the past.  He went to the OR and had ACell applied on 10/26 and they are following him as an outpatient.  They have ordered surgical lubricant gel and gauze dressings to be applied to the sacrum wound.  Wound type: Sacrum chronic stage 4 pressure injury; multiple open areas are 90% beefy red, 10% yellow, small amt bloody drainage, no odor, bone palpable.  Approx total area of wounds which are separated by narrow skin boarders is 30X30X.3cm Left hip with chronic stage 4 pressure injury; 8X5X.3cm, 80% red, 20% yellow, mod amt yellow drainage, no odor, bone palpable when probed with a swab Right hip with chronic stage 3 pressure injury; 7X10X.3cm; approx total area of wounds which are separated by narrow skin boarders, mod amt yellow drainage, no odor, 80% red, 20% eschar Right thigh full thickness chronic wound; 1X1X.3cm, 100% red and moist, mod amt yellow drainage, no odor Right heel with dark purple deep tissue pressure injury; 1X1cm Left outer foot with partial thickness wound; .5X.5X.1cm, pink and dry Right abd with full thickness chronic wound; 2X4X.3cm, 90% red, 10% yellow, mod amt yellow drainage, no odor Right posterior chest with full thickness chronic wound; 12X5X.3cm, 100% red and moist, mod amt yellow drainage, no odor Pressure Injury POA: Yes Dressing procedure/placement/frequency: Air mattress ordered to reduce pressure.  Float heels.  Topical treatment orders provided for bedside nurses to perform daily as follows:  Santyl to provide enzymatic debridement of nonviable tissue to right hip wounds.  Foam dressings to protect heel and foot wounds from further injury. Moist gauze dressings to left hip, right thigh, right thigh, right abd, right posterior chest. Continue present plan of  care to sacrum as ordered by the plastic team with surgical lubricant gel and gauze dressings Q day.  Pt can resume follow-up with their team after discharge.   Pt has a colostomy with pouch intact with good seal; mod amt brown unformed stool in the pouch.  2 piece pouch used prior to admission.  Supplies ordered to the bedside for staff nurse use.  One hour spent performing this consult.   Please re-consult if further assistance is needed.  Thank-you,  Julien Girt MSN, [place], [place], [place], [place]

## 2019-03-20 NOTE — Progress Notes (Signed)
PHARMACY - PHYSICIAN COMMUNICATION CRITICAL VALUE ALERT - BLOOD CULTURE IDENTIFICATION (BCID)  [name] [name] is an 53 y.o. male who presented to [facility] on 03/19/2019 with a chief complaint of fever, cough and congestion  Assessment:  53 year old man with quadriplegia, supra-pubic catheter, ESBL ecoli bacteremia, on meropenem prior to admission for osteomyelitis  Name of physician (or Provider) Contacted: Dr. Eliseo Squires  Current antibiotics: Meropenem  Changes to prescribed antibiotics recommended:  Patient is on recommended antibiotics - No changes needed.  Will inform ID team of patient's admission.  Results for orders placed or performed during the hospital encounter of 03/19/19  Blood Culture ID Panel (Reflexed) (Collected: 03/19/2019  3:15 PM)  Result Value Ref Range   Enterococcus species NOT DETECTED NOT DETECTED   Listeria monocytogenes NOT DETECTED NOT DETECTED   Staphylococcus species NOT DETECTED NOT DETECTED   Staphylococcus aureus (BCID) NOT DETECTED NOT DETECTED   Streptococcus species NOT DETECTED NOT DETECTED   Streptococcus agalactiae NOT DETECTED NOT DETECTED   Streptococcus pneumoniae NOT DETECTED NOT DETECTED   Streptococcus pyogenes NOT DETECTED NOT DETECTED   Acinetobacter baumannii DETECTED (A) NOT DETECTED   Enterobacteriaceae species NOT DETECTED NOT DETECTED   Enterobacter cloacae complex NOT DETECTED NOT DETECTED   Escherichia coli NOT DETECTED NOT DETECTED   Klebsiella oxytoca NOT DETECTED NOT DETECTED   Klebsiella pneumoniae NOT DETECTED NOT DETECTED   Proteus species NOT DETECTED NOT DETECTED   Serratia marcescens NOT DETECTED NOT DETECTED   Carbapenem resistance NOT DETECTED NOT DETECTED   Haemophilus influenzae NOT DETECTED NOT DETECTED   Neisseria meningitidis NOT DETECTED NOT DETECTED   Pseudomonas aeruginosa NOT DETECTED NOT DETECTED   Candida albicans NOT DETECTED NOT DETECTED   Candida glabrata NOT DETECTED NOT DETECTED   Candida krusei NOT  DETECTED NOT DETECTED   Candida parapsilosis NOT DETECTED NOT DETECTED   Candida tropicalis NOT DETECTED NOT DETECTED    [name] 03/20/2019  8:20 AM
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
## 2019-03-20 NOTE — Progress Notes (Signed)
CRITICAL VALUE ALERT  Critical Value:  hbg 5.9   Date & Time Notied:  03/20/19 [id number]  Provider Notified: Baltazar Najjar with [facility]  Orders Received/Actions taken: PRBC ordered later in the afternoon

## 2019-03-21 DIAGNOSIS — T07XXXA Unspecified multiple injuries, initial encounter: Secondary | ICD-10-CM

## 2019-03-21 LAB — CBC
HCT: 26.9 % — ABNORMAL LOW (ref 39.0–52.0)
Hemoglobin: 8.4 g/dL — ABNORMAL LOW (ref 13.0–17.0)
MCH: 26.6 pg (ref 26.0–34.0)
MCHC: 31.2 g/dL (ref 30.0–36.0)
MCV: 85.1 fL (ref 80.0–100.0)
Platelets: 353 10*3/uL (ref 150–400)
RBC: 3.16 MIL/uL — ABNORMAL LOW (ref 4.22–5.81)
RDW: 17 % — ABNORMAL HIGH (ref 11.5–15.5)
WBC: 7.3 10*3/uL (ref 4.0–10.5)
nRBC: 0 % (ref 0.0–0.2)

## 2019-03-21 LAB — BPAM RBC
Blood Product Expiration Date: 202012112359
ISSUE DATE / TIME: 202011041553
Unit Type and Rh: 7300

## 2019-03-21 LAB — TYPE AND SCREEN
ABO/RH(D): B POS
Antibody Screen: NEGATIVE
Unit division: 0

## 2019-03-21 MED ORDER — MUPIROCIN 2 % EX OINT
1.0000 "application " | TOPICAL_OINTMENT | Freq: Two times a day (BID) | CUTANEOUS | Status: AC
  Administered 2019-03-21 – 2019-03-25 (×8): 1 via NASAL
  Filled 2019-03-21 (×2): qty 22

## 2019-03-21 MED ORDER — ENSURE ENLIVE PO LIQD
237.0000 mL | Freq: Two times a day (BID) | ORAL | Status: DC
  Administered 2019-03-22: 237 mL via ORAL
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
## 2019-03-22 ENCOUNTER — Inpatient Hospital Stay (HOSPITAL_COMMUNITY): Payer: Medicare Other

## 2019-03-22 ENCOUNTER — Encounter (HOSPITAL_BASED_OUTPATIENT_CLINIC_OR_DEPARTMENT_OTHER): Payer: Medicare Other | Admitting: Internal Medicine

## 2019-03-22 DIAGNOSIS — Z933 Colostomy status: Secondary | ICD-10-CM

## 2019-03-22 DIAGNOSIS — G4733 Obstructive sleep apnea (adult) (pediatric): Secondary | ICD-10-CM

## 2019-03-22 DIAGNOSIS — Z9989 Dependence on other enabling machines and devices: Secondary | ICD-10-CM

## 2019-03-22 LAB — BASIC METABOLIC PANEL
Anion gap: 7 (ref 5–15)
Anion gap: 7 (ref 5–15)
BUN: 6 mg/dL (ref 6–20)
BUN: 9 mg/dL (ref 6–20)
CO2: 25 mmol/L (ref 22–32)
CO2: 26 mmol/L (ref 22–32)
Calcium: 7.3 mg/dL — ABNORMAL LOW (ref 8.9–10.3)
Calcium: 7.6 mg/dL — ABNORMAL LOW (ref 8.9–10.3)
Chloride: 106 mmol/L (ref 98–111)
Chloride: 106 mmol/L (ref 98–111)
Creatinine, Ser: <0.3 mg/dL — ABNORMAL LOW (ref 0.61–1.24)
Creatinine, Ser: <0.3 mg/dL — ABNORMAL LOW (ref 0.61–1.24)
Glucose, Bld: 110 mg/dL — ABNORMAL HIGH (ref 70–99)
Glucose, Bld: 95 mg/dL (ref 70–99)
Potassium: 3.8 mmol/L (ref 3.5–5.1)
Potassium: 4.1 mmol/L (ref 3.5–5.1)
Sodium: 138 mmol/L (ref 135–145)
Sodium: 139 mmol/L (ref 135–145)

## 2019-03-22 LAB — CBC
HCT: 30.7 % — ABNORMAL LOW (ref 39.0–52.0)
Hemoglobin: 9.6 g/dL — ABNORMAL LOW (ref 13.0–17.0)
MCH: 26.5 pg (ref 26.0–34.0)
MCHC: 31.3 g/dL (ref 30.0–36.0)
MCV: 84.8 fL (ref 80.0–100.0)
Platelets: 426 10*3/uL — ABNORMAL HIGH (ref 150–400)
RBC: 3.62 MIL/uL — ABNORMAL LOW (ref 4.22–5.81)
RDW: 17.1 % — ABNORMAL HIGH (ref 11.5–15.5)
WBC: 8.7 10*3/uL (ref 4.0–10.5)
nRBC: 0 % (ref 0.0–0.2)

## 2019-03-22 LAB — CULTURE, BLOOD (ROUTINE X 2): Special Requests: ADEQUATE

## 2019-03-22 MED ORDER — SENNOSIDES-DOCUSATE SODIUM 8.6-50 MG PO TABS: 2.0000 | ORAL_TABLET | Freq: Every evening | ORAL | Status: DC | PRN

## 2019-03-22 MED ORDER — LEVALBUTEROL HCL 1.25 MG/0.5ML IN NEBU: 1.2500 mg | INHALATION_SOLUTION | Freq: Four times a day (QID) | RESPIRATORY_TRACT | Status: DC | PRN

## 2019-03-22 MED ORDER — SODIUM CHLORIDE 0.9 % IV SOLN
INTRAVENOUS | Status: AC
  Administered 2019-03-23: 03:00:00 via INTRAVENOUS

## 2019-03-22 MED ORDER — POLYETHYLENE GLYCOL 3350 17 G PO PACK
17.0000 g | PACK | Freq: Every day | ORAL | Status: DC | PRN
  Filled 2019-03-22: qty 1

## 2019-03-22 MED ORDER — SULFAMETHOXAZOLE-TRIMETHOPRIM 800-160 MG PO TABS
2.0000 | ORAL_TABLET | Freq: Two times a day (BID) | ORAL | Status: DC
  Administered 2019-03-22 – 2019-03-27 (×4): 2 via ORAL
  Filled 2019-03-22 (×13): qty 2

## 2019-03-22 MED ORDER — ONDANSETRON HCL 4 MG/2ML IJ SOLN
4.0000 mg | Freq: Four times a day (QID) | INTRAMUSCULAR | Status: DC | PRN
  Administered 2019-03-22 – 2019-03-27 (×2): 4 mg via INTRAVENOUS
  Filled 2019-03-22 (×2): qty 2

## 2019-03-22 MED ORDER — ACETAMINOPHEN 325 MG PO TABS
650.0000 mg | ORAL_TABLET | Freq: Four times a day (QID) | ORAL | Status: DC | PRN
  Administered 2019-03-22: 650 mg via ORAL
  Filled 2019-03-22: qty 2

## 2019-03-22 MED ORDER — MEROPENEM IV (FOR PTA / DISCHARGE USE ONLY): 1.0000 g | Freq: Three times a day (TID) | INTRAVENOUS | 0 refills | Status: AC

## 2019-03-22 MED ORDER — IPRATROPIUM BROMIDE 0.02 % IN SOLN
0.5000 mg | Freq: Four times a day (QID) | RESPIRATORY_TRACT | Status: DC | PRN
  Administered 2019-03-24: 0.5 mg via RESPIRATORY_TRACT
  Filled 2019-03-22: qty 2.5

## 2019-03-22 NOTE — Plan of Care (Signed)
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
## 2019-03-22 NOTE — Progress Notes (Signed)
Patient was nauseated but during chest PT began vomiting, using oral suction to prevent aspiration. Zofran ordered and given over 4 minutes IVP. About an hour later nausea became worse, with feeling short of breath, RR up, SPO2 down. O2 3L [place] applied titrating O2 saturations above 92%. Sinus tach and tachypnea. Provider notified via page and chat messaging. In total state EKG, and chest xray obtained and provider updated on status and results between 1700 and 1900. Pending an abd xray. Breathing treatments ordered. Abd distended, with colostomy requiring burping once a hour. Small amount of stool. 900 ml of emesis between 1700 and 1900. Report given of night shift RN. His number messaged to Dr. Reesa Chew. Mew scores addressed. Simmie Davies RN

## 2019-03-22 NOTE — Progress Notes (Signed)
Pt BP 88/60 MAP 69. This is pt's baseline. Notified MD on call. No new orders received. Will continue to monitor. BMET and CBC already ordered for the AM.

## 2019-03-22 NOTE — Progress Notes (Signed)
PHARMACY CONSULT NOTE FOR:  OUTPATIENT  PARENTERAL ANTIBIOTIC THERAPY (OPAT)  Indication: ESBL wound infection Regimen: Meropenem 1 gm Q 8 hours  End date: 04/09/2019   Add 14 days of oral Bactrim DS BID for acinetobacter treatment. Will stop daily potassium replacement as Bactrim can cause hyperkalemia.   IV antibiotic discharge orders are pended. To discharging provider:  please sign these orders via discharge navigator,  Select New Orders & click on the button choice - Manage This Unsigned Work.     Thank you for allowing pharmacy to be a part of this patient's care.  Jimmy Footman, PharmD, BCPS, [place] [facility] Clinical Pharmacist Phone: [phone] 03/22/2019, 10:20 AM

## 2019-03-22 NOTE — Progress Notes (Signed)
[facility]   Reason for visit: Follow up on bacteremia  Interval History: no repeat blood cultures could be obtained.  WBC wnl, afebrile. No associated rash or diarrhea.  [name].    Physical Exam: Constitutional:  Vitals:   03/22/19 0410 03/22/19 0907  BP: (!) 88/60 (!) 89/64  Pulse: 94 97  Resp: 18 (!) 24  Temp:  98.3 F (36.8 C)  SpO2: 97% 94%   patient appears in NAD Eyes: anicteric Respiratory: Normal respiratory effort; CTA B Cardiovascular: RRR MS: no edema  Review of Systems: Constitutional: negative for fevers, chills, malaise and anorexia Gastrointestinal: negative for nausea and diarrhea Integument/breast: negative for rash  Lab Results  Component Value Date   WBC 8.7 03/22/2019   HGB 9.6 (L) 03/22/2019   HCT 30.7 (L) 03/22/2019   MCV 84.8 03/22/2019   PLT 426 (H) 03/22/2019    Lab Results  Component Value Date   CREATININE <0.30 (L) 03/22/2019   BUN 6 03/22/2019   NA 139 03/22/2019   K 3.8 03/22/2019   CL 106 03/22/2019   CO2 26 03/22/2019    Lab Results  Component Value Date   ALT 16 03/19/2019   AST 22 03/19/2019   ALKPHOS 76 03/19/2019     Microbiology: Recent Results (from the past 240 hour(s))  Culture, blood (Routine x 2)     Status: Abnormal   Collection Time: 03/19/19  3:15 PM   Specimen: BLOOD  Result Value Ref Range Status   Specimen Description BLOOD RIGHT CHEST  Final   Special Requests   Final    BOTTLES DRAWN AEROBIC AND ANAEROBIC Blood Culture adequate volume   Culture  Setup Time   Final    GRAM NEGATIVE RODS AEROBIC BOTTLE ONLY CRITICAL RESULT CALLED TO, READ BACK BY AND VERIFIED WITH: Susy Manor [id number] FCP Performed at [facility], [address], [place], [place] [address]    Culture ACINETOBACTER CALCOACETICUS/BAUMANNII COMPLEX (A)  Final   Report Status 03/22/2019 FINAL  Final   Organism ID, Bacteria ACINETOBACTER CALCOACETICUS/BAUMANNII COMPLEX   Final      Susceptibility   Acinetobacter calcoaceticus/baumannii complex - MIC*    CEFTAZIDIME 8 SENSITIVE Sensitive     CEFTRIAXONE 32 INTERMEDIATE Intermediate     CIPROFLOXACIN >=4 RESISTANT Resistant     GENTAMICIN <=1 SENSITIVE Sensitive     IMIPENEM >=16 RESISTANT Resistant     PIP/TAZO >=128 RESISTANT Resistant     TRIMETH/SULFA <=20 SENSITIVE Sensitive     CEFEPIME 8 SENSITIVE Sensitive     AMPICILLIN/SULBACTAM 4 SENSITIVE Sensitive     * ACINETOBACTER CALCOACETICUS/BAUMANNII COMPLEX  Blood Culture ID Panel (Reflexed)     Status: Abnormal   Collection Time: 03/19/19  3:15 PM  Result Value Ref Range Status   Enterococcus species NOT DETECTED NOT DETECTED Final   Listeria monocytogenes NOT DETECTED NOT DETECTED Final   Staphylococcus species NOT DETECTED NOT DETECTED Final   Staphylococcus aureus (BCID) NOT DETECTED NOT DETECTED Final   Streptococcus species NOT DETECTED NOT DETECTED Final   Streptococcus agalactiae NOT DETECTED NOT DETECTED Final   Streptococcus pneumoniae NOT DETECTED NOT DETECTED Final   Streptococcus pyogenes NOT DETECTED NOT DETECTED Final   Acinetobacter baumannii DETECTED (A) NOT DETECTED Final    Comment: CRITICAL RESULT CALLED TO, READ BACK BY AND VERIFIED WITH: PHARMD JEREMY FRENS [id number] FCP    Enterobacteriaceae species NOT DETECTED NOT DETECTED Final   Enterobacter cloacae complex  NOT DETECTED NOT DETECTED Final   Escherichia coli NOT DETECTED NOT DETECTED Final   Klebsiella oxytoca NOT DETECTED NOT DETECTED Final   Klebsiella pneumoniae NOT DETECTED NOT DETECTED Final   Proteus species NOT DETECTED NOT DETECTED Final   Serratia marcescens NOT DETECTED NOT DETECTED Final   Carbapenem resistance NOT DETECTED NOT DETECTED Final   Haemophilus influenzae NOT DETECTED NOT DETECTED Final   Neisseria meningitidis NOT DETECTED NOT DETECTED Final   Pseudomonas aeruginosa NOT DETECTED NOT DETECTED Final   Candida albicans NOT DETECTED NOT  DETECTED Final   Candida glabrata NOT DETECTED NOT DETECTED Final   Candida krusei NOT DETECTED NOT DETECTED Final   Candida parapsilosis NOT DETECTED NOT DETECTED Final   Candida tropicalis NOT DETECTED NOT DETECTED Final    Comment: Performed at [facility], [place] [address], [place], [place] [address]  Urine culture     Status: None   Collection Time: 03/19/19  3:22 PM   Specimen: In/Out Cath Urine  Result Value Ref Range Status   Specimen Description IN/OUT CATH URINE  Final   Special Requests NONE  Final   Culture   Final    NO GROWTH Performed at [facility], [place] [address] [place], [place] [address]    Report Status 03/20/2019 FINAL  Final  Culture, blood (Routine x 2)     Status: None (Preliminary result)   Collection Time: 03/19/19  3:38 PM   Specimen: BLOOD  Result Value Ref Range Status   Specimen Description BLOOD RIGHT WRIST  Final   Special Requests   Final    BOTTLES DRAWN AEROBIC AND ANAEROBIC Blood Culture results may not be optimal due to an inadequate volume of blood received in culture bottles   Culture   Final    NO GROWTH 2 DAYS Performed at [facility], [place] [address] [place], [place] [address]    Report Status PENDING  Incomplete  SARS Coronavirus 2 by RT PCR (hospital order, performed in [facility] hospital lab) Nasopharyngeal Nasopharyngeal Swab     Status: None   Collection Time: 03/19/19  5:28 PM   Specimen: Nasopharyngeal Swab  Result Value Ref Range Status   SARS Coronavirus 2 NEGATIVE NEGATIVE Final    Comment: (NOTE) If result is NEGATIVE SARS-CoV-2 target nucleic acids are NOT DETECTED. The SARS-CoV-2 RNA is generally detectable in upper and lower  respiratory specimens during the acute phase of infection. The lowest  concentration of SARS-CoV-2 viral copies this assay can detect is 250  copies / mL. A negative result does not preclude SARS-CoV-2 infection  and should not be used as the sole basis for treatment or other   patient management decisions.  A negative result may occur with  improper specimen collection / handling, submission of specimen other  than nasopharyngeal swab, presence of viral mutation(s) within the  areas targeted by this assay, and inadequate number of viral copies  (<250 copies / mL). A negative result must be combined with clinical  observations, patient history, and epidemiological information. If result is POSITIVE SARS-CoV-2 target nucleic acids are DETECTED. The SARS-CoV-2 RNA is generally detectable in upper and lower  respiratory specimens dur ing the acute phase of infection.  Positive  results are indicative of active infection with SARS-CoV-2.  Clinical  correlation with patient history and other diagnostic information is  necessary to determine patient infection status.  Positive results do  not rule out bacterial infection or co-infection with other viruses.  If result is PRESUMPTIVE POSTIVE SARS-CoV-2 nucleic acids MAY BE PRESENT.   A presumptive positive result was obtained on the submitted specimen  and confirmed on repeat testing.  While 2019 novel coronavirus  (SARS-CoV-2) nucleic acids may be present in the submitted sample  additional confirmatory testing may be necessary for epidemiological  and / or clinical management purposes  to differentiate between  SARS-CoV-2 and other Sarbecovirus currently known to infect humans.  If clinically indicated additional testing with an alternate test  methodology [phone]) is advised. The SARS-CoV-2 RNA is generally  detectable in upper and lower respiratory sp ecimens during the acute  phase of infection. The expected result is Negative. Fact Sheet for Patients:  StrictlyIdeas.no Fact Sheet for Healthcare Providers: BankingDealers.co.za This test is not yet approved or cleared by the [place] FDA and has been authorized for detection and/or diagnosis of SARS-CoV-2 by  FDA under an Emergency Use Authorization (EUA).  This EUA will remain in effect (meaning this test can be used) for the duration of the COVID-19 declaration under Section 564(b)(1) of the Act, 21 U.S.C. section 360bbb-3(b)(1), unless the authorization is terminated or revoked sooner. Performed at [facility], [place] [address], [place], [place] [address]   MRSA PCR Screening     Status: Abnormal   Collection Time: 03/20/19  4:38 PM   Specimen: Nasal Mucosa; Nasopharyngeal  Result Value Ref Range Status   MRSA by PCR POSITIVE (A) NEGATIVE Final    Comment:        The GeneXpert MRSA Assay (FDA approved for NASAL specimens only), is one component of a comprehensive MRSA colonization surveillance program. It is not intended to diagnose MRSA infection nor to guide or monitor treatment for MRSA infections. RESULT CALLED TO, READ BACK BY AND VERIFIED WITH: Redmond School RN 1902 03/20/19 A BROWNING Performed at [facility], [place] [address] [place], [place] [address]     Impression/Plan:  1. Acinetobacter bacteremia - in one culture, one bottle, has not been febrile.  Unclear significance but with his wound and history, will treat and can use bactrim DS two tabs twice a day and would treat for 14 days since he has a port.  With gram negative organisms and with the suspected transient nature of the blood culture growth, I do not feel there is an indication for port removal.    2.  ESBL wound - continue with meropenem for same duration as previously planned.  3.  Medication monitoring - creat and KCl are good but will need to continue monitoring this on Bactrim.  Would consider stopping KCl supplementation while on it.    [facility] already arranged Will sign off

## 2019-03-23 ENCOUNTER — Inpatient Hospital Stay (HOSPITAL_COMMUNITY): Payer: Medicare Other

## 2019-03-23 DIAGNOSIS — K56609 Unspecified intestinal obstruction, unspecified as to partial versus complete obstruction: Secondary | ICD-10-CM

## 2019-03-23 LAB — CBC
HCT: 28.2 % — ABNORMAL LOW (ref 39.0–52.0)
Hemoglobin: 8.6 g/dL — ABNORMAL LOW (ref 13.0–17.0)
MCH: 26.5 pg (ref 26.0–34.0)
MCHC: 30.5 g/dL (ref 30.0–36.0)
MCV: 87 fL (ref 80.0–100.0)
Platelets: 357 10*3/uL (ref 150–400)
RBC: 3.24 MIL/uL — ABNORMAL LOW (ref 4.22–5.81)
RDW: 17.2 % — ABNORMAL HIGH (ref 11.5–15.5)
WBC: 8.5 10*3/uL (ref 4.0–10.5)
nRBC: 0 % (ref 0.0–0.2)

## 2019-03-23 LAB — BASIC METABOLIC PANEL
Anion gap: 5 (ref 5–15)
BUN: 9 mg/dL (ref 6–20)
CO2: 27 mmol/L (ref 22–32)
Calcium: 7.6 mg/dL — ABNORMAL LOW (ref 8.9–10.3)
Chloride: 108 mmol/L (ref 98–111)
Creatinine, Ser: <0.3 mg/dL — ABNORMAL LOW (ref 0.61–1.24)
Glucose, Bld: 104 mg/dL — ABNORMAL HIGH (ref 70–99)
Potassium: 3.3 mmol/L — ABNORMAL LOW (ref 3.5–5.1)
Sodium: 140 mmol/L (ref 135–145)

## 2019-03-23 LAB — MAGNESIUM: Magnesium: 1.6 mg/dL — ABNORMAL LOW (ref 1.7–2.4)

## 2019-03-23 LAB — BRAIN NATRIURETIC PEPTIDE: B Natriuretic Peptide: 49 pg/mL (ref 0.0–100.0)

## 2019-03-23 MED ORDER — POTASSIUM CHLORIDE 10 MEQ/100ML IV SOLN
10.0000 meq | INTRAVENOUS | Status: AC
  Administered 2019-03-23 (×4): 10 meq via INTRAVENOUS

## 2019-03-23 MED ORDER — POTASSIUM CHLORIDE 10 MEQ/100ML IV SOLN
10.0000 meq | INTRAVENOUS | Status: AC
  Filled 2019-03-23: qty 100

## 2019-03-23 MED ORDER — PROMETHAZINE HCL 25 MG/ML IJ SOLN
12.5000 mg | Freq: Three times a day (TID) | INTRAMUSCULAR | Status: DC | PRN
  Administered 2019-03-23: 12.5 mg via INTRAVENOUS
  Filled 2019-03-23: qty 1

## 2019-03-23 MED ORDER — MAGNESIUM SULFATE 2 GM/50ML IV SOLN
2.0000 g | Freq: Once | INTRAVENOUS | Status: AC
  Administered 2019-03-23: 2 g via INTRAVENOUS
  Filled 2019-03-23: qty 50
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
## 2019-03-24 LAB — BASIC METABOLIC PANEL
Anion gap: 8 (ref 5–15)
BUN: 8 mg/dL (ref 6–20)
CO2: 24 mmol/L (ref 22–32)
Calcium: 7.5 mg/dL — ABNORMAL LOW (ref 8.9–10.3)
Chloride: 110 mmol/L (ref 98–111)
Creatinine, Ser: 0.3 mg/dL — ABNORMAL LOW (ref 0.61–1.24)
GFR calc Af Amer: >60 mL/min (ref >60)
GFR calc non Af Amer: >60 mL/min (ref >60)
Glucose, Bld: 64 mg/dL — ABNORMAL LOW (ref 70–99)
Potassium: 3.8 mmol/L (ref 3.5–5.1)
Sodium: 142 mmol/L (ref 135–145)

## 2019-03-24 LAB — GLUCOSE, CAPILLARY
Glucose-Capillary: 142 mg/dL — ABNORMAL HIGH (ref 70–99)
Glucose-Capillary: 49 mg/dL — ABNORMAL LOW (ref 70–99)
Glucose-Capillary: 125 mg/dL — ABNORMAL HIGH (ref 70–99)
Glucose-Capillary: 53 mg/dL — ABNORMAL LOW (ref 70–99)
Glucose-Capillary: 68 mg/dL — ABNORMAL LOW (ref 70–99)
Glucose-Capillary: 90 mg/dL (ref 70–99)
Glucose-Capillary: 78 mg/dL (ref 70–99)
Glucose-Capillary: 78 mg/dL (ref 70–99)

## 2019-03-24 LAB — CBC
HCT: 28.6 % — ABNORMAL LOW (ref 39.0–52.0)
Hemoglobin: 8.7 g/dL — ABNORMAL LOW (ref 13.0–17.0)
MCH: 26.4 pg (ref 26.0–34.0)
MCHC: 30.4 g/dL (ref 30.0–36.0)
MCV: 86.7 fL (ref 80.0–100.0)
Platelets: 386 10*3/uL (ref 150–400)
RBC: 3.3 MIL/uL — ABNORMAL LOW (ref 4.22–5.81)
RDW: 17.4 % — ABNORMAL HIGH (ref 11.5–15.5)
WBC: 6.2 10*3/uL (ref 4.0–10.5)
nRBC: 0 % (ref 0.0–0.2)

## 2019-03-24 LAB — CULTURE, BLOOD (ROUTINE X 2): Culture: NO GROWTH

## 2019-03-24 LAB — MAGNESIUM: Magnesium: 1.7 mg/dL (ref 1.7–2.4)

## 2019-03-24 MED ORDER — DEXTROSE 50 % IV SOLN
INTRAVENOUS | Status: AC
  Administered 2019-03-24: 17:00:00
  Filled 2019-03-24: qty 50

## 2019-03-24 MED ORDER — DEXTROSE-NACL 5-0.45 % IV SOLN
INTRAVENOUS | Status: DC
  Administered 2019-03-25 – 2019-03-26 (×3): via INTRAVENOUS

## 2019-03-24 MED ORDER — ORAL CARE MOUTH RINSE
15.0000 mL | Freq: Two times a day (BID) | OROMUCOSAL | Status: DC
  Administered 2019-03-25 – 2019-03-26 (×2): 15 mL via OROMUCOSAL

## 2019-03-24 MED ORDER — DEXTROSE 50 % IV SOLN
INTRAVENOUS | Status: AC
  Administered 2019-03-24: 50 mL via INTRAVENOUS
  Filled 2019-03-24: qty 50

## 2019-03-24 MED ORDER — DEXTROSE 50 % IV SOLN
INTRAVENOUS | Status: AC
  Administered 2019-03-24: 50 mL
  Filled 2019-03-24: qty 50

## 2019-03-24 MED ORDER — DEXTROSE 50 % IV SOLN
25.0000 g | INTRAVENOUS | Status: AC
  Administered 2019-03-24: 14:00:00 50 mL via INTRAVENOUS

## 2019-03-24 MED ORDER — DEXTROSE-NACL 5-0.45 % IV SOLN
INTRAVENOUS | Status: DC
  Administered 2019-03-24: 14:00:00 via INTRAVENOUS

## 2019-03-24 NOTE — Progress Notes (Signed)
MD on call notified of pt's cbg per AM labs being 50. Also pt is npo due to NG tube.Will continue to monitor pt & await new orders.  Hoover Brunette, RN

## 2019-03-24 NOTE — Progress Notes (Signed)
Hypoglycemic Event  CBG: 55   Treatment: D50 50 mL (25 gm)  Symptoms: None  Follow-up CBG: Time:1410  CBG Result:125  Possible Reasons for Event: Inadequate meal intake  Comments/MD notified:Alam, Tawfikul,Md    [name] [name]
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
## 2019-03-24 NOTE — Progress Notes (Signed)
Hypoglycemic Event  CBG: 49  Treatment: D50 50 mL (25 gm)  Symptoms: None  Follow-up CBG: Time:0908 CBG Result:142  Possible Reasons for Event: Inadequate meal intake  Pt has a NG tube in.   Comments/MD notified:Amin, Ankit, MD    [place]

## 2019-03-25 ENCOUNTER — Inpatient Hospital Stay (HOSPITAL_COMMUNITY): Payer: Medicare Other

## 2019-03-25 LAB — BASIC METABOLIC PANEL
Anion gap: 7 (ref 5–15)
BUN: 5 mg/dL — ABNORMAL LOW (ref 6–20)
CO2: 27 mmol/L (ref 22–32)
Calcium: 7.6 mg/dL — ABNORMAL LOW (ref 8.9–10.3)
Chloride: 108 mmol/L (ref 98–111)
Creatinine, Ser: 0.33 mg/dL — ABNORMAL LOW (ref 0.61–1.24)
GFR calc Af Amer: >60 mL/min (ref >60)
GFR calc non Af Amer: >60 mL/min (ref >60)
Glucose, Bld: 116 mg/dL — ABNORMAL HIGH (ref 70–99)
Potassium: 3.1 mmol/L — ABNORMAL LOW (ref 3.5–5.1)
Sodium: 142 mmol/L (ref 135–145)

## 2019-03-25 LAB — GLUCOSE, CAPILLARY
Glucose-Capillary: 66 mg/dL — ABNORMAL LOW (ref 70–99)
Glucose-Capillary: 100 mg/dL — ABNORMAL HIGH (ref 70–99)
Glucose-Capillary: 81 mg/dL (ref 70–99)
Glucose-Capillary: 80 mg/dL (ref 70–99)
Glucose-Capillary: 75 mg/dL (ref 70–99)
Glucose-Capillary: 97 mg/dL (ref 70–99)

## 2019-03-25 LAB — MAGNESIUM: Magnesium: 1.6 mg/dL — ABNORMAL LOW (ref 1.7–2.4)

## 2019-03-25 LAB — CBC
HCT: 30.5 % — ABNORMAL LOW (ref 39.0–52.0)
Hemoglobin: 9.3 g/dL — ABNORMAL LOW (ref 13.0–17.0)
MCH: 26 pg (ref 26.0–34.0)
MCHC: 30.5 g/dL (ref 30.0–36.0)
MCV: 85.2 fL (ref 80.0–100.0)
Platelets: 450 10*3/uL — ABNORMAL HIGH (ref 150–400)
RBC: 3.58 MIL/uL — ABNORMAL LOW (ref 4.22–5.81)
RDW: 17.4 % — ABNORMAL HIGH (ref 11.5–15.5)
WBC: 9.6 10*3/uL (ref 4.0–10.5)
nRBC: 0 % (ref 0.0–0.2)

## 2019-03-25 MED ORDER — MAGNESIUM SULFATE 2 GM/50ML IV SOLN
2.0000 g | Freq: Once | INTRAVENOUS | Status: AC
  Administered 2019-03-25: 2 g via INTRAVENOUS
  Filled 2019-03-25: qty 50

## 2019-03-25 MED ORDER — POTASSIUM CHLORIDE 10 MEQ/100ML IV SOLN
10.0000 meq | INTRAVENOUS | Status: AC
  Administered 2019-03-25 (×4): 10 meq via INTRAVENOUS
  Filled 2019-03-25 (×4): qty 100

## 2019-03-25 MED ORDER — MAGNESIUM SULFATE 50 % IJ SOLN: 2.0000 g | Freq: Once | INTRAMUSCULAR | Status: DC

## 2019-03-25 MED ORDER — DEXTROSE 50 % IV SOLN
INTRAVENOUS | Status: AC
  Administered 2019-03-25: 25 mL
  Filled 2019-03-25: qty 50

## 2019-03-25 MED ORDER — DEXTROSE 50 % IV SOLN
12.5000 g | INTRAVENOUS | Status: AC
  Administered 2019-03-25: 12.5 g via INTRAVENOUS

## 2019-03-25 NOTE — Progress Notes (Signed)
Pt tolerated clear liquid diet. Per MD Izetta Dakin, okay to cap NG tube and see how patient does overnight.
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
## 2019-03-26 LAB — BASIC METABOLIC PANEL
Anion gap: 7 (ref 5–15)
BUN: 6 mg/dL (ref 6–20)
CO2: 27 mmol/L (ref 22–32)
Calcium: 7.6 mg/dL — ABNORMAL LOW (ref 8.9–10.3)
Chloride: 105 mmol/L (ref 98–111)
Creatinine, Ser: <0.3 mg/dL — ABNORMAL LOW (ref 0.61–1.24)
Glucose, Bld: 108 mg/dL — ABNORMAL HIGH (ref 70–99)
Potassium: 3.8 mmol/L (ref 3.5–5.1)
Sodium: 139 mmol/L (ref 135–145)

## 2019-03-26 LAB — CBC
HCT: 31.7 % — ABNORMAL LOW (ref 39.0–52.0)
Hemoglobin: 10 g/dL — ABNORMAL LOW (ref 13.0–17.0)
MCH: 26.6 pg (ref 26.0–34.0)
MCHC: 31.5 g/dL (ref 30.0–36.0)
MCV: 84.3 fL (ref 80.0–100.0)
Platelets: 463 10*3/uL — ABNORMAL HIGH (ref 150–400)
RBC: 3.76 MIL/uL — ABNORMAL LOW (ref 4.22–5.81)
RDW: 17.5 % — ABNORMAL HIGH (ref 11.5–15.5)
WBC: 9 10*3/uL (ref 4.0–10.5)
nRBC: 0 % (ref 0.0–0.2)

## 2019-03-26 LAB — GLUCOSE, CAPILLARY
Glucose-Capillary: 101 mg/dL — ABNORMAL HIGH (ref 70–99)
Glucose-Capillary: 119 mg/dL — ABNORMAL HIGH (ref 70–99)
Glucose-Capillary: 119 mg/dL — ABNORMAL HIGH (ref 70–99)
Glucose-Capillary: 120 mg/dL — ABNORMAL HIGH (ref 70–99)
Glucose-Capillary: 88 mg/dL (ref 70–99)
Glucose-Capillary: 92 mg/dL (ref 70–99)
Glucose-Capillary: 96 mg/dL (ref 70–99)

## 2019-03-26 LAB — MAGNESIUM: Magnesium: 1.7 mg/dL (ref 1.7–2.4)

## 2019-03-26 MED ORDER — SODIUM CHLORIDE 0.9% FLUSH
10.0000 mL | Freq: Two times a day (BID) | INTRAVENOUS | Status: DC
  Administered 2019-03-26 – 2019-03-27 (×2): 10 mL

## 2019-03-26 MED ORDER — SODIUM CHLORIDE 0.9% FLUSH: 10.0000 mL | INTRAVENOUS | Status: DC | PRN

## 2019-03-26 NOTE — Plan of Care (Signed)
  Problem: Nutrition: Goal: Adequate nutrition will be maintained Outcome: Progressing Note: NG tube removed per order. Pt tolerated well. His diet was changed to soft diet since he tolerated his clear liquids this morning. Pt instructed to notify RN of pain/discomfort with new diet order.
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
## 2019-03-27 ENCOUNTER — Inpatient Hospital Stay (HOSPITAL_COMMUNITY): Payer: Medicare Other

## 2019-03-27 LAB — CBC
HCT: 31.1 % — ABNORMAL LOW (ref 39.0–52.0)
Hemoglobin: 9.6 g/dL — ABNORMAL LOW (ref 13.0–17.0)
MCH: 26.5 pg (ref 26.0–34.0)
MCHC: 30.9 g/dL (ref 30.0–36.0)
MCV: 85.9 fL (ref 80.0–100.0)
Platelets: 405 10*3/uL — ABNORMAL HIGH (ref 150–400)
RBC: 3.62 MIL/uL — ABNORMAL LOW (ref 4.22–5.81)
RDW: 17.3 % — ABNORMAL HIGH (ref 11.5–15.5)
WBC: 9.5 10*3/uL (ref 4.0–10.5)
nRBC: 0 % (ref 0.0–0.2)

## 2019-03-27 LAB — BASIC METABOLIC PANEL
Anion gap: 7 (ref 5–15)
BUN: 6 mg/dL (ref 6–20)
CO2: 27 mmol/L (ref 22–32)
Calcium: 7.6 mg/dL — ABNORMAL LOW (ref 8.9–10.3)
Chloride: 104 mmol/L (ref 98–111)
Creatinine, Ser: <0.3 mg/dL — ABNORMAL LOW (ref 0.61–1.24)
Glucose, Bld: 116 mg/dL — ABNORMAL HIGH (ref 70–99)
Potassium: 3.8 mmol/L (ref 3.5–5.1)
Sodium: 138 mmol/L (ref 135–145)

## 2019-03-27 LAB — GLUCOSE, CAPILLARY
Glucose-Capillary: 93 mg/dL (ref 70–99)
Glucose-Capillary: 87 mg/dL (ref 70–99)
Glucose-Capillary: 92 mg/dL (ref 70–99)
Glucose-Capillary: 95 mg/dL (ref 70–99)
Glucose-Capillary: 96 mg/dL (ref 70–99)

## 2019-03-27 LAB — MAGNESIUM: Magnesium: 1.6 mg/dL — ABNORMAL LOW (ref 1.7–2.4)

## 2019-03-27 MED ORDER — CHLORHEXIDINE GLUCONATE CLOTH 2 % EX PADS: 6.0000 | MEDICATED_PAD | Freq: Every day | CUTANEOUS | 0 refills | Status: DC

## 2019-03-27 MED ORDER — ORAL CARE MOUTH RINSE: 15.0000 mL | Freq: Two times a day (BID) | OROMUCOSAL | 0 refills | Status: DC

## 2019-03-27 MED ORDER — GUAIFENESIN 100 MG/5ML PO SOLN: 5.0000 mL | ORAL | 0 refills | Status: AC | PRN

## 2019-03-27 MED ORDER — SENNOSIDES-DOCUSATE SODIUM 8.6-50 MG PO TABS: 2.0000 | ORAL_TABLET | Freq: Every evening | ORAL | 0 refills | Status: DC | PRN

## 2019-03-27 MED ORDER — POLYETHYLENE GLYCOL 3350 17 G PO PACK: 17.0000 g | PACK | Freq: Every day | ORAL | 0 refills | Status: DC | PRN

## 2019-03-27 MED ORDER — LEVALBUTEROL TARTRATE 45 MCG/ACT IN AERO: 1.0000 | INHALATION_SPRAY | RESPIRATORY_TRACT | 0 refills | Status: AC | PRN

## 2019-03-27 MED ORDER — ACETAMINOPHEN 325 MG PO TABS: 650.0000 mg | ORAL_TABLET | Freq: Four times a day (QID) | ORAL | 0 refills | Status: AC | PRN

## 2019-03-27 MED ORDER — SULFAMETHOXAZOLE-TRIMETHOPRIM 800-160 MG PO TABS: 2.0000 | ORAL_TABLET | Freq: Two times a day (BID) | ORAL | 0 refills | Status: DC

## 2019-03-27 MED ORDER — IPRATROPIUM BROMIDE HFA 17 MCG/ACT IN AERS: 1.0000 | INHALATION_SPRAY | Freq: Three times a day (TID) | RESPIRATORY_TRACT | 2 refills | Status: DC
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
## 2019-03-27 NOTE — Progress Notes (Signed)
Discharge instructions given to patient. Night nurse given pertinent information. She will advise patient on what he needs to take tonight or tomorrow depending on what time he leaves the facility via PTAR. Patient will be going home where he lives with his sister. I also heard him making his arrangements on the phone with home health. Katherina Right RN

## 2019-03-27 NOTE — TOC Transition Note (Addendum)
Transition of Care [facility]) - CM/SW Discharge Note Marvetta Gibbons RN,BSN Transitions of Care Unit 4NP (non trauma) - RN Case Manager [phone]   Patient Details  Name: [name] [name] MRN: [id number] Date of Birth: 1966/04/10  Transition of Care [facility]) CM/SW Contact:  Dawayne Patricia, RN Phone Number: 03/27/2019, 3:14 PM   Clinical Narrative:    Pt stable for transition home, NGT has been removed. Spoke with Lattie Haw at [facility] this AM- they will f/u with pt at home for [facility] needs- along with [facility] for home IV abx needs through 11/24. [name] with [facility] aware of pt's discharge home today. PTAR will be arranged for transport home, paperwork has been placed on shadow chart for PTAR.    Final next level of care: [place] Barriers to Discharge: Barriers Resolved   Patient Goals and CMS Choice Patient states their goals for this hospitalization and ongoing recovery are:: to return home CMS Medicare.gov Compare Post Acute Care list provided to:: Patient Choice offered to / list presented to : Patient  Discharge Placement                 Home with [facility]      Discharge Plan and Services   Discharge Planning Services: CM Consult Post Acute Care Choice: Home Health          DME Arranged: N/A DME Agency: NA       HH Arranged: RN, IV Antibiotics HH Agency: [facility] [place] Date [facility] Agency Contacted: 03/27/19 Time [place]: [place] Representative spoke with at [place]: [place] ([place]) Interventions     Readmission Risk Interventions Readmission Risk Prevention Plan 03/27/2019 03/07/2019 01/04/2019  Transportation Screening Complete Complete Complete  PCP or Specialist Appt within 5-7 Days - - Complete  PCP or Specialist Appt within 3-5 Days - Complete -  Not Complete comments - - -  Home Care Screening - - Complete  Medication Review (RN CM) - - Complete  HRI or [place] - Complete -  Social Work Consult  for [place] Planning/Counseling - Complete -  Palliative Care Screening - Not Applicable -  Medication Review Press photographer) Complete Referral to Pharmacy -  PCP or Specialist appointment within 3-5 days of discharge Complete - -  [place] or Home Care Consult Complete - -  SW Recovery Care/Counseling Consult Complete - -  Palliative Care Screening Not Applicable - -  [place] Not Applicable - -  Some recent data might be hidden

## 2019-03-27 NOTE — Progress Notes (Signed)
PTAR scheduled for pick-up after 5 PM, per patient request; his sister will not be home from work to accept him home until after 5.  Laveda Abbe, [place] Clinical Social Worker [phone]

## 2019-03-28 LAB — GLUCOSE, CAPILLARY: Glucose-Capillary: 87 mg/dL (ref 70–99)

## 2019-03-28 MED ORDER — HEPARIN SOD (PORK) LOCK FLUSH 100 UNIT/ML IV SOLN
500.0000 [IU] | INTRAVENOUS | Status: AC | PRN
  Administered 2019-03-28: 500 [IU]
  Filled 2019-03-28: qty 5

## 2019-03-29 DIAGNOSIS — A4189 Other specified sepsis: Secondary | ICD-10-CM | POA: Diagnosis not present

## 2019-03-29 DIAGNOSIS — K219 Gastro-esophageal reflux disease without esophagitis: Secondary | ICD-10-CM | POA: Diagnosis not present

## 2019-03-29 DIAGNOSIS — D649 Anemia, unspecified: Secondary | ICD-10-CM | POA: Diagnosis not present

## 2019-03-29 DIAGNOSIS — K56609 Unspecified intestinal obstruction, unspecified as to partial versus complete obstruction: Secondary | ICD-10-CM | POA: Diagnosis not present

## 2019-03-29 DIAGNOSIS — L89154 Pressure ulcer of sacral region, stage 4: Secondary | ICD-10-CM | POA: Diagnosis not present

## 2019-03-29 DIAGNOSIS — M8660 Other chronic osteomyelitis, unspecified site: Secondary | ICD-10-CM | POA: Diagnosis not present

## 2019-03-29 DIAGNOSIS — J9610 Chronic respiratory failure, unspecified whether with hypoxia or hypercapnia: Secondary | ICD-10-CM | POA: Diagnosis not present

## 2019-03-29 DIAGNOSIS — G8253 Quadriplegia, C5-C7 complete: Secondary | ICD-10-CM | POA: Diagnosis not present

## 2019-03-29 DIAGNOSIS — G4733 Obstructive sleep apnea (adult) (pediatric): Secondary | ICD-10-CM | POA: Diagnosis not present

## 2019-03-29 DIAGNOSIS — Z435 Encounter for attention to cystostomy: Secondary | ICD-10-CM | POA: Diagnosis not present

## 2019-03-29 DIAGNOSIS — A4151 Sepsis due to Escherichia coli [E. coli]: Secondary | ICD-10-CM | POA: Diagnosis not present

## 2019-03-29 DIAGNOSIS — E43 Unspecified severe protein-calorie malnutrition: Secondary | ICD-10-CM | POA: Diagnosis not present

## 2019-03-29 DIAGNOSIS — L89224 Pressure ulcer of left hip, stage 4: Secondary | ICD-10-CM | POA: Diagnosis not present

## 2019-03-29 DIAGNOSIS — E669 Obesity, unspecified: Secondary | ICD-10-CM | POA: Diagnosis not present

## 2019-03-29 DIAGNOSIS — I739 Peripheral vascular disease, unspecified: Secondary | ICD-10-CM | POA: Diagnosis not present

## 2019-03-29 DIAGNOSIS — I9589 Other hypotension: Secondary | ICD-10-CM | POA: Diagnosis not present

## 2019-04-01 ENCOUNTER — Other Ambulatory Visit: Payer: Self-pay

## 2019-04-01 ENCOUNTER — Inpatient Hospital Stay (HOSPITAL_COMMUNITY): Payer: Medicare Other

## 2019-04-01 ENCOUNTER — Ambulatory Visit (HOSPITAL_BASED_OUTPATIENT_CLINIC_OR_DEPARTMENT_OTHER): Payer: Medicare Other | Admitting: Internal Medicine

## 2019-04-01 ENCOUNTER — Encounter (HOSPITAL_COMMUNITY): Payer: Self-pay | Admitting: Internal Medicine

## 2019-04-01 ENCOUNTER — Emergency Department (HOSPITAL_COMMUNITY): Payer: Medicare Other

## 2019-04-01 ENCOUNTER — Inpatient Hospital Stay (HOSPITAL_COMMUNITY)
Admission: EM | Admit: 2019-04-01 | Discharge: 2019-04-05 | DRG: 698 | Disposition: A | Discharge status: Home / Self Care | Payer: Medicare Other | Attending: Internal Medicine | Admitting: Internal Medicine

## 2019-04-01 ENCOUNTER — Inpatient Hospital Stay: Payer: Self-pay

## 2019-04-01 DIAGNOSIS — Z20828 Contact with and (suspected) exposure to other viral communicable diseases: Secondary | ICD-10-CM | POA: Diagnosis present

## 2019-04-01 DIAGNOSIS — L8943 Pressure ulcer of contiguous site of back, buttock and hip, stage 3: Secondary | ICD-10-CM | POA: Diagnosis not present

## 2019-04-01 DIAGNOSIS — Z79899 Other long term (current) drug therapy: Secondary | ICD-10-CM

## 2019-04-01 DIAGNOSIS — T83511A Infection and inflammatory reaction due to indwelling urethral catheter, initial encounter: Secondary | ICD-10-CM | POA: Diagnosis not present

## 2019-04-01 DIAGNOSIS — R000 Tachycardia, unspecified: Secondary | ICD-10-CM | POA: Diagnosis present

## 2019-04-01 DIAGNOSIS — Z515 Encounter for palliative care: Secondary | ICD-10-CM | POA: Diagnosis not present

## 2019-04-01 DIAGNOSIS — I959 Hypotension, unspecified: Secondary | ICD-10-CM | POA: Diagnosis present

## 2019-04-01 DIAGNOSIS — F329 Major depressive disorder, single episode, unspecified: Secondary | ICD-10-CM | POA: Diagnosis present

## 2019-04-01 DIAGNOSIS — I9589 Other hypotension: Secondary | ICD-10-CM | POA: Diagnosis present

## 2019-04-01 DIAGNOSIS — Z888 Allergy status to other drugs, medicaments and biological substances status: Secondary | ICD-10-CM

## 2019-04-01 DIAGNOSIS — I5031 Acute diastolic (congestive) heart failure: Secondary | ICD-10-CM | POA: Diagnosis present

## 2019-04-01 DIAGNOSIS — R6510 Systemic inflammatory response syndrome (SIRS) of non-infectious origin without acute organ dysfunction: Secondary | ICD-10-CM

## 2019-04-01 DIAGNOSIS — R110 Nausea: Secondary | ICD-10-CM | POA: Diagnosis not present

## 2019-04-01 DIAGNOSIS — S14109S Unspecified injury at unspecified level of cervical spinal cord, sequela: Secondary | ICD-10-CM

## 2019-04-01 DIAGNOSIS — B965 Pseudomonas (aeruginosa) (mallei) (pseudomallei) as the cause of diseases classified elsewhere: Secondary | ICD-10-CM | POA: Diagnosis present

## 2019-04-01 DIAGNOSIS — L89893 Pressure ulcer of other site, stage 3: Secondary | ICD-10-CM | POA: Diagnosis present

## 2019-04-01 DIAGNOSIS — Z87440 Personal history of urinary (tract) infections: Secondary | ICD-10-CM

## 2019-04-01 DIAGNOSIS — L89213 Pressure ulcer of right hip, stage 3: Secondary | ICD-10-CM | POA: Diagnosis present

## 2019-04-01 DIAGNOSIS — D509 Iron deficiency anemia, unspecified: Secondary | ICD-10-CM | POA: Diagnosis present

## 2019-04-01 DIAGNOSIS — N390 Urinary tract infection, site not specified: Secondary | ICD-10-CM | POA: Diagnosis not present

## 2019-04-01 DIAGNOSIS — E43 Unspecified severe protein-calorie malnutrition: Secondary | ICD-10-CM | POA: Diagnosis present

## 2019-04-01 DIAGNOSIS — R0602 Shortness of breath: Secondary | ICD-10-CM | POA: Diagnosis not present

## 2019-04-01 DIAGNOSIS — J9610 Chronic respiratory failure, unspecified whether with hypoxia or hypercapnia: Secondary | ICD-10-CM | POA: Diagnosis not present

## 2019-04-01 DIAGNOSIS — Z66 Do not resuscitate: Secondary | ICD-10-CM | POA: Diagnosis not present

## 2019-04-01 DIAGNOSIS — D638 Anemia in other chronic diseases classified elsewhere: Secondary | ICD-10-CM | POA: Diagnosis present

## 2019-04-01 DIAGNOSIS — L89154 Pressure ulcer of sacral region, stage 4: Secondary | ICD-10-CM | POA: Diagnosis not present

## 2019-04-01 DIAGNOSIS — L89114 Pressure ulcer of right upper back, stage 4: Secondary | ICD-10-CM | POA: Diagnosis present

## 2019-04-01 DIAGNOSIS — M4628 Osteomyelitis of vertebra, sacral and sacrococcygeal region: Secondary | ICD-10-CM | POA: Diagnosis present

## 2019-04-01 DIAGNOSIS — E662 Morbid (severe) obesity with alveolar hypoventilation: Secondary | ICD-10-CM | POA: Diagnosis present

## 2019-04-01 DIAGNOSIS — K219 Gastro-esophageal reflux disease without esophagitis: Secondary | ICD-10-CM | POA: Diagnosis present

## 2019-04-01 DIAGNOSIS — A419 Sepsis, unspecified organism: Secondary | ICD-10-CM | POA: Diagnosis not present

## 2019-04-01 DIAGNOSIS — L89223 Pressure ulcer of left hip, stage 3: Secondary | ICD-10-CM | POA: Diagnosis present

## 2019-04-01 DIAGNOSIS — I313 Pericardial effusion (noninflammatory): Secondary | ICD-10-CM | POA: Diagnosis not present

## 2019-04-01 DIAGNOSIS — I110 Hypertensive heart disease with heart failure: Secondary | ICD-10-CM | POA: Diagnosis present

## 2019-04-01 DIAGNOSIS — Z6823 Body mass index (BMI) 23.0-23.9, adult: Secondary | ICD-10-CM

## 2019-04-01 DIAGNOSIS — R0902 Hypoxemia: Secondary | ICD-10-CM | POA: Diagnosis not present

## 2019-04-01 DIAGNOSIS — G8250 Quadriplegia, unspecified: Secondary | ICD-10-CM | POA: Diagnosis not present

## 2019-04-01 DIAGNOSIS — Z9359 Other cystostomy status: Secondary | ICD-10-CM

## 2019-04-01 DIAGNOSIS — D508 Other iron deficiency anemias: Secondary | ICD-10-CM | POA: Diagnosis not present

## 2019-04-01 DIAGNOSIS — Z933 Colostomy status: Secondary | ICD-10-CM

## 2019-04-01 LAB — CBC WITH DIFFERENTIAL/PLATELET
Abs Immature Granulocytes: 0.1 10*3/uL — ABNORMAL HIGH (ref 0.00–0.07)
Basophils Absolute: 0.1 10*3/uL (ref 0.0–0.1)
Basophils Relative: 0 %
Eosinophils Absolute: 0.1 10*3/uL (ref 0.0–0.5)
Eosinophils Relative: 1 %
HCT: 28.8 % — ABNORMAL LOW (ref 39.0–52.0)
Hemoglobin: 9 g/dL — ABNORMAL LOW (ref 13.0–17.0)
Immature Granulocytes: 1 %
Lymphocytes Relative: 9 %
Lymphs Abs: 1.5 10*3/uL (ref 0.7–4.0)
MCH: 26.3 pg (ref 26.0–34.0)
MCHC: 31.3 g/dL (ref 30.0–36.0)
MCV: 84.2 fL (ref 80.0–100.0)
Monocytes Absolute: 1.5 10*3/uL — ABNORMAL HIGH (ref 0.1–1.0)
Monocytes Relative: 9 %
Neutro Abs: 13.6 10*3/uL — ABNORMAL HIGH (ref 1.7–7.7)
Neutrophils Relative %: 80 %
Platelets: 432 10*3/uL — ABNORMAL HIGH (ref 150–400)
RBC: 3.42 MIL/uL — ABNORMAL LOW (ref 4.22–5.81)
RDW: 17.9 % — ABNORMAL HIGH (ref 11.5–15.5)
WBC: 16.8 10*3/uL — ABNORMAL HIGH (ref 4.0–10.5)
nRBC: 0 % (ref 0.0–0.2)

## 2019-04-01 LAB — COMPREHENSIVE METABOLIC PANEL
ALT: 20 U/L (ref 0–44)
AST: 23 U/L (ref 15–41)
Albumin: 1.4 g/dL — ABNORMAL LOW (ref 3.5–5.0)
Alkaline Phosphatase: 107 U/L (ref 38–126)
Anion gap: 4 — ABNORMAL LOW (ref 5–15)
BUN: 11 mg/dL (ref 6–20)
CO2: 26 mmol/L (ref 22–32)
Calcium: 7.7 mg/dL — ABNORMAL LOW (ref 8.9–10.3)
Chloride: 107 mmol/L (ref 98–111)
Creatinine, Ser: 0.4 mg/dL — ABNORMAL LOW (ref 0.61–1.24)
GFR calc Af Amer: >60 mL/min (ref >60)
GFR calc non Af Amer: >60 mL/min (ref >60)
Glucose, Bld: 104 mg/dL — ABNORMAL HIGH (ref 70–99)
Potassium: 4 mmol/L (ref 3.5–5.1)
Sodium: 137 mmol/L (ref 135–145)
Total Bilirubin: 0.2 mg/dL — ABNORMAL LOW (ref 0.3–1.2)
Total Protein: 5.3 g/dL — ABNORMAL LOW (ref 6.5–8.1)

## 2019-04-01 LAB — URINALYSIS, ROUTINE W REFLEX MICROSCOPIC
Bilirubin Urine: NEGATIVE
Glucose, UA: NEGATIVE mg/dL
Hgb urine dipstick: NEGATIVE
Ketones, ur: NEGATIVE mg/dL
Nitrite: NEGATIVE
Protein, ur: NEGATIVE mg/dL
Specific Gravity, Urine: 1.014 (ref 1.005–1.030)
pH: 7 (ref 5.0–8.0)

## 2019-04-01 LAB — CORTISOL: Cortisol, Plasma: 26.3 ug/dL

## 2019-04-01 LAB — MAGNESIUM: Magnesium: 1.3 mg/dL — ABNORMAL LOW (ref 1.7–2.4)

## 2019-04-01 LAB — ECHOCARDIOGRAM COMPLETE
Height: 72 in
Weight: 2687.85 oz

## 2019-04-01 LAB — TROPONIN I (HIGH SENSITIVITY)
Troponin I (High Sensitivity): <2 ng/L (ref <18)
Troponin I (High Sensitivity): <2 ng/L (ref <18)

## 2019-04-01 LAB — LACTIC ACID, PLASMA
Lactic Acid, Venous: 0.9 mmol/L (ref 0.5–1.9)
Lactic Acid, Venous: 0.7 mmol/L (ref 0.5–1.9)
Lactic Acid, Venous: 0.7 mmol/L (ref 0.5–1.9)

## 2019-04-01 LAB — SARS CORONAVIRUS 2 (TAT 6-24 HRS): SARS Coronavirus 2: NEGATIVE

## 2019-04-01 LAB — CK: Total CK: 16 U/L — ABNORMAL LOW (ref 49–397)

## 2019-04-01 LAB — PROCALCITONIN: Procalcitonin: <0.1 ng/mL

## 2019-04-01 LAB — D-DIMER, QUANTITATIVE: D-Dimer, Quant: 2.57 ug/mL-FEU — ABNORMAL HIGH (ref 0.00–0.50)

## 2019-04-01 MED ORDER — PROMETHAZINE HCL 25 MG/ML IJ SOLN
25.0000 mg | INTRAMUSCULAR | Status: AC
  Administered 2019-04-01: 25 mg via INTRAVENOUS
  Filled 2019-04-01: qty 1

## 2019-04-01 MED ORDER — VANCOMYCIN HCL 10 G IV SOLR
1500.0000 mg | Freq: Once | INTRAVENOUS | Status: AC
  Administered 2019-04-02: 1500 mg via INTRAVENOUS
  Filled 2019-04-01: qty 1500

## 2019-04-01 MED ORDER — SULFAMETHOXAZOLE-TRIMETHOPRIM 800-160 MG PO TABS
2.0000 | ORAL_TABLET | Freq: Two times a day (BID) | ORAL | Status: AC
  Administered 2019-04-01 – 2019-04-04 (×8): 2 via ORAL
  Filled 2019-04-01 (×8): qty 2

## 2019-04-01 MED ORDER — METOCLOPRAMIDE HCL 10 MG PO TABS
10.0000 mg | ORAL_TABLET | Freq: Three times a day (TID) | ORAL | Status: DC
  Administered 2019-04-01 – 2019-04-05 (×14): 10 mg via ORAL
  Filled 2019-04-01 (×15): qty 1

## 2019-04-01 MED ORDER — ALBUTEROL SULFATE (2.5 MG/3ML) 0.083% IN NEBU
3.0000 mL | INHALATION_SOLUTION | Freq: Four times a day (QID) | RESPIRATORY_TRACT | Status: DC | PRN
  Administered 2019-04-03 – 2019-04-05 (×2): 3 mL via RESPIRATORY_TRACT
  Filled 2019-04-01 (×2): qty 3

## 2019-04-01 MED ORDER — RISAQUAD PO CAPS
1.0000 | ORAL_CAPSULE | Freq: Every day | ORAL | Status: DC
  Administered 2019-04-01 – 2019-04-05 (×5): 1 via ORAL
  Filled 2019-04-01 (×5): qty 1

## 2019-04-01 MED ORDER — IPRATROPIUM BROMIDE 0.02 % IN SOLN
0.5000 mg | Freq: Three times a day (TID) | RESPIRATORY_TRACT | Status: DC
  Administered 2019-04-01: 22:00:00 0.5 mg via RESPIRATORY_TRACT
  Filled 2019-04-01: qty 2.5

## 2019-04-01 MED ORDER — ONDANSETRON HCL 4 MG/2ML IJ SOLN
4.0000 mg | Freq: Four times a day (QID) | INTRAMUSCULAR | Status: DC | PRN
  Administered 2019-04-01 – 2019-04-03 (×3): 4 mg via INTRAVENOUS
  Filled 2019-04-01 (×3): qty 2

## 2019-04-01 MED ORDER — ZINC 50 MG PO TABS: 50.0000 mg | ORAL_TABLET | Freq: Two times a day (BID) | ORAL | Status: DC

## 2019-04-01 MED ORDER — MAGNESIUM OXIDE 400 (241.3 MG) MG PO TABS
400.0000 mg | ORAL_TABLET | Freq: Every day | ORAL | Status: DC
  Administered 2019-04-02 – 2019-04-05 (×4): 400 mg via ORAL
  Filled 2019-04-01 (×4): qty 1

## 2019-04-01 MED ORDER — PIPERACILLIN-TAZOBACTAM 3.375 G IVPB 30 MIN: 3.3750 g | INTRAVENOUS | Status: DC

## 2019-04-01 MED ORDER — MAGNESIUM SULFATE 2 GM/50ML IV SOLN
2.0000 g | INTRAVENOUS | Status: AC
  Administered 2019-04-01: 2 g via INTRAVENOUS
  Filled 2019-04-01: qty 50

## 2019-04-01 MED ORDER — CHLORHEXIDINE GLUCONATE CLOTH 2 % EX PADS: 6.0000 | MEDICATED_PAD | Freq: Every day | CUTANEOUS | Status: DC

## 2019-04-01 MED ORDER — PIPERACILLIN-TAZOBACTAM 3.375 G IVPB: 3.3750 g | Freq: Three times a day (TID) | INTRAVENOUS | Status: DC

## 2019-04-01 MED ORDER — SODIUM CHLORIDE 0.9 % IV SOLN
1.0000 g | Freq: Three times a day (TID) | INTRAVENOUS | Status: DC
  Administered 2019-04-01 – 2019-04-05 (×13): 1 g via INTRAVENOUS
  Filled 2019-04-01 (×15): qty 1

## 2019-04-01 MED ORDER — IPRATROPIUM BROMIDE HFA 17 MCG/ACT IN AERS: 1.0000 | INHALATION_SPRAY | Freq: Three times a day (TID) | RESPIRATORY_TRACT | Status: DC

## 2019-04-01 MED ORDER — SODIUM CHLORIDE 0.9 % IV BOLUS
1000.0000 mL | Freq: Once | INTRAVENOUS | Status: AC
  Administered 2019-04-01: 1000 mL via INTRAVENOUS

## 2019-04-01 MED ORDER — PANTOPRAZOLE SODIUM 40 MG PO TBEC
40.0000 mg | DELAYED_RELEASE_TABLET | Freq: Two times a day (BID) | ORAL | Status: DC
  Administered 2019-04-01 – 2019-04-05 (×9): 40 mg via ORAL
  Filled 2019-04-01 (×10): qty 1

## 2019-04-01 MED ORDER — POTASSIUM CHLORIDE CRYS ER 20 MEQ PO TBCR
40.0000 meq | EXTENDED_RELEASE_TABLET | Freq: Every day | ORAL | Status: DC
  Administered 2019-04-01 – 2019-04-05 (×5): 40 meq via ORAL
  Filled 2019-04-01 (×5): qty 2

## 2019-04-01 MED ORDER — ENSURE MAX PROTEIN PO LIQD
11.0000 [oz_av] | Freq: Every day | ORAL | Status: DC
  Administered 2019-04-01 – 2019-04-05 (×4): 11 [oz_av] via ORAL
  Filled 2019-04-01 (×5): qty 330

## 2019-04-01 MED ORDER — BACLOFEN 10 MG PO TABS
20.0000 mg | ORAL_TABLET | Freq: Two times a day (BID) | ORAL | Status: DC
  Administered 2019-04-01 – 2019-04-05 (×9): 20 mg via ORAL
  Filled 2019-04-01 (×10): qty 2

## 2019-04-01 MED ORDER — ACETAMINOPHEN 325 MG PO TABS: 650.0000 mg | ORAL_TABLET | Freq: Four times a day (QID) | ORAL | Status: DC | PRN

## 2019-04-01 MED ORDER — PROBIOTIC 250 MG PO CAPS: ORAL_CAPSULE | Freq: Every day | ORAL | Status: DC

## 2019-04-01 MED ORDER — SODIUM CHLORIDE 0.9 % IV BOLUS
500.0000 mL | Freq: Once | INTRAVENOUS | Status: AC
  Administered 2019-04-01: 12:00:00 500 mL via INTRAVENOUS

## 2019-04-01 MED ORDER — POLYETHYLENE GLYCOL 3350 17 G PO PACK: 17.0000 g | PACK | Freq: Every day | ORAL | Status: DC | PRN

## 2019-04-01 MED ORDER — ORAL CARE MOUTH RINSE
15.0000 mL | Freq: Two times a day (BID) | OROMUCOSAL | Status: DC
  Administered 2019-04-01 – 2019-04-05 (×7): 15 mL via OROMUCOSAL

## 2019-04-01 MED ORDER — VITAMIN C 500 MG PO TABS
500.0000 mg | ORAL_TABLET | Freq: Every day | ORAL | Status: DC
  Administered 2019-04-01 – 2019-04-05 (×5): 500 mg via ORAL
  Filled 2019-04-01 (×5): qty 1

## 2019-04-01 MED ORDER — ADULT MULTIVITAMIN W/MINERALS CH
1.0000 | ORAL_TABLET | Freq: Every morning | ORAL | Status: DC
  Administered 2019-04-01 – 2019-04-05 (×5): 1 via ORAL
  Filled 2019-04-01 (×5): qty 1

## 2019-04-01 MED ORDER — SENNOSIDES-DOCUSATE SODIUM 8.6-50 MG PO TABS: 2.0000 | ORAL_TABLET | Freq: Every evening | ORAL | Status: DC | PRN

## 2019-04-01 MED ORDER — MAGNESIUM OXIDE 400 MG PO TABS
400.0000 mg | ORAL_TABLET | Freq: Every day | ORAL | Status: DC
  Administered 2019-04-01: 400 mg via ORAL
  Filled 2019-04-01 (×3): qty 1

## 2019-04-01 MED ORDER — SODIUM CHLORIDE 0.9 % IV SOLN
INTRAVENOUS | Status: AC
  Administered 2019-04-01 (×2): via INTRAVENOUS

## 2019-04-01 MED ORDER — VANCOMYCIN HCL 10 G IV SOLR
1250.0000 mg | INTRAVENOUS | Status: DC
  Administered 2019-04-02: 1250 mg via INTRAVENOUS
  Filled 2019-04-01 (×2): qty 1250

## 2019-04-01 MED ORDER — GUAIFENESIN 100 MG/5ML PO SOLN: 5.0000 mL | ORAL | Status: DC | PRN

## 2019-04-01 MED ORDER — SUCRALFATE 1 G PO TABS
1.0000 g | ORAL_TABLET | Freq: Four times a day (QID) | ORAL | Status: DC
  Administered 2019-04-01 – 2019-04-05 (×18): 1 g via ORAL
  Filled 2019-04-01 (×19): qty 1

## 2019-04-01 MED ORDER — FESOTERODINE FUMARATE ER 8 MG PO TB24
8.0000 mg | ORAL_TABLET | Freq: Every morning | ORAL | Status: DC
  Administered 2019-04-01 – 2019-04-05 (×5): 8 mg via ORAL
  Filled 2019-04-01 (×6): qty 1

## 2019-04-01 MED ORDER — MIDODRINE HCL 5 MG PO TABS
10.0000 mg | ORAL_TABLET | Freq: Two times a day (BID) | ORAL | Status: DC
  Administered 2019-04-01 – 2019-04-04 (×5): 10 mg via ORAL
  Filled 2019-04-01 (×7): qty 2

## 2019-04-01 MED ORDER — SIMETHICONE 80 MG PO CHEW: 80.0000 mg | CHEWABLE_TABLET | Freq: Four times a day (QID) | ORAL | Status: DC | PRN

## 2019-04-01 MED ORDER — ZINC SULFATE 220 (50 ZN) MG PO CAPS
220.0000 mg | ORAL_CAPSULE | Freq: Every day | ORAL | Status: DC
  Administered 2019-04-01 – 2019-04-05 (×5): 220 mg via ORAL
  Filled 2019-04-01 (×5): qty 1

## 2019-04-01 MED ORDER — ACETAMINOPHEN 650 MG RE SUPP: 650.0000 mg | Freq: Four times a day (QID) | RECTAL | Status: DC | PRN

## 2019-04-01 MED ORDER — JUVEN PO PACK
1.0000 | PACK | Freq: Two times a day (BID) | ORAL | Status: DC
  Administered 2019-04-01 – 2019-04-02 (×2): 1 via ORAL
  Filled 2019-04-01 (×6): qty 1

## 2019-04-01 MED ORDER — ENOXAPARIN SODIUM 40 MG/0.4ML ~~LOC~~ SOLN
40.0000 mg | Freq: Every day | SUBCUTANEOUS | Status: DC
  Administered 2019-04-01 – 2019-04-05 (×5): 40 mg via SUBCUTANEOUS
  Filled 2019-04-01 (×6): qty 0.4

## 2019-04-01 MED ORDER — CHLORHEXIDINE GLUCONATE CLOTH 2 % EX PADS
6.0000 | MEDICATED_PAD | Freq: Every day | CUTANEOUS | Status: DC
  Administered 2019-04-01 – 2019-04-02 (×2): 6 via TOPICAL

## 2019-04-01 MED ORDER — SODIUM CHLORIDE 0.9 % IV SOLN
500.0000 mg | Freq: Once | INTRAVENOUS | Status: AC
  Administered 2019-04-01: 500 mg via INTRAVENOUS
  Filled 2019-04-01: qty 10

## 2019-04-01 MED ORDER — FERROUS SULFATE 325 (65 FE) MG PO TABS
325.0000 mg | ORAL_TABLET | Freq: Every day | ORAL | Status: DC
  Administered 2019-04-01 – 2019-04-05 (×5): 325 mg via ORAL
  Filled 2019-04-01 (×5): qty 1

## 2019-04-01 NOTE — ED Triage Notes (Signed)
Per EMS _ Coming from home pt noticed suprapubic cath displpaced about an hour ago. Pt is complaining of nausea. Has wheezing in lower left lung fields (which pt states is normal for him). Has stage 4 sacral wounds that are being treated at home.   110 78 88 HR 18 R 94% RA

## 2019-04-01 NOTE — Consult Note (Addendum)
[place] Nurse Consult Note: Patient receiving care in [phone].  Assisted with turning/positioning for evaluation of multitude of wounds by primary RN, Wells Guiles. Reason for Consult: multiple wounds, and a LLQ colostomy Wound type: See each wound note. The left trochanter has a stage 3 PI that measures 5.3 cm x 5.1 cm x 0.7 cm.  It is pink and without odor.  The care for this wound will be saline moistened gauze and foam dressing daily.  For the right anterior upper thigh, the wound measures 1.1 cm x 2.4 cm x 0.6 cm, it is a stage 3 and is 100% pink, no odor.  For this wound:  Saline moistened gauze and foam dressing; change daily.  On the right posterior flank the patient has the following wounds:  Right axillary area the wound measures 3.2 cm x 5.8 cm.  It is an unstageable PI that is 50% brown and 50% pink.  The care for this wound is saline moistened gauze covered with dry gauze. Change twice daily.  Below this in the right lateral mid back flank area there is an unstageable PI that measures 11.4 cm x 6 cm and is yellow and maroon in color.  The care for this area is saline moistened gauze covered with dry gauze, twice daily.  At the right lower costal margin just below the last rib, on the back and at the flank area there is a stage 4 PI that measures 3 cm x 7 cm x 2.2  cm.  There is 2.8 cm of undermining of this wound at 1 o'clock.  The wound is pink, no odor.  The care for this wound is saline moistened gauze topped with dry gauze twice daily.  The right trochanter area has a stage 3 PI that measures 6.3 cm x 10.2 cm and is pink with a thin yellow layer that can be seen through.  Saline moistened gauze topped by dry gauze twice daily is the care for this wound.  The right trochanter has an unstageable PI that is brown and measures 2.4 cm x 4 cm.  Care for this wound is saline moistened gauze topped by dry gauze, twice daily.   Finally, the entire buttocks and a small area of the posterior scrotum  is an open wound.  It is 99% pink, no odor, no drainage.  At the area of what I am presuming was a flap there is a pocket I probed with a cotton tipped applicator, that extends to 8cm.  I placed one saline moistened gauze into this area and left a portion sticking out for removal.  The entire wound area measures 31 cm x 23 cm x 3.2 cm.  The care for this area is saline moistened gauze, topped with ABD pads, change twice daily.  Monitor the wound area(s) for worsening of condition such as: Signs/symptoms of infection,  Increase in size,  Development of or worsening of odor, Development of pain, or increased pain at the affected locations.  Notify the medical team if any of these develop.  [place] Nurse ostomy follow up Stoma type/location: LLQ colostomy Stomal assessment/size: deferred measurement. Stoma pink, budded. Peristomal assessment: deferred; pouch intact, no signs of leakage. Treatment options for stomal/peristomal skin: barrier ring if needed. Output: scant brown stool in pouch Ostomy pouching: 2pc.   Thank you for the consult.  Discussed plan of care with the patient and bedside nurse.  [place] nurse will not follow at this time.  Please re-consult the [place] team if needed.  Val Riles, RN, MSN, CWOCN, CNS-BC, pager [phone]
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
## 2019-04-01 NOTE — Progress Notes (Signed)
  Echocardiogram 2D Echocardiogram has been performed.  [name] [name] 04/01/2019, 11:55 AM

## 2019-04-01 NOTE — Progress Notes (Addendum)
Pharmacy Antibiotic Note  [name] [name] is a 53 y.o. male admitted on 04/01/2019 with sepsis.  Pharmacy has been consulted for vancomycin dosing. He has h/o AKI listed as allergy but has received vancomycin since that time without known issues.    Per ID (previous admission) on meropenem (for sacral wound/OM) and TMP/SMZ (for acinetobacter bacteremia) prior to admission   Plan:  Based on vancomycin dosing during previous hospitalization,  Patient does not fit estimated patient population parameters.  SUspect this is due to his SCr not estimating his CrCl accurately from being quadriplegic.    Give vancomycin 1500mg  IV x 1 then 1250mg  IV q24h.  Since daptomycin given at 06:30 this am - give 1st dose of vancomycin later tonight  This is based off previous dosing/levels  Monitor renal function closely - daily SCr for now.  Check levels if remains on vancomycin > 3-4 days  F/u d/c vancomycin if no evidence of MRSA.    Height: 6' (182.9 cm) Weight: 167 lb 15.9 oz (76.2 kg) IBW/kg (Calculated) : 77.6  Temp (24hrs), Avg:98.6 F (37 C), Min:98.4 F (36.9 C), Max:98.8 F (37.1 C)  Recent Labs  Lab 03/26/19 1122 03/26/19 1309 03/27/19 0128 04/01/19 0240  WBC  --  9.0 9.5 16.8*  CREATININE <0.30*  --  <0.30* 0.40*  LATICACIDVEN  --   --   --  0.9    Estimated Creatinine Clearance: 116.4 mL/min (A) (by C-G formula based on SCr of 0.4 mg/dL (L)).    Allergies  Allergen Reactions  . Ferumoxytol Anaphylaxis and Other (See Comments)    (Feraheme) = "SYNCOPE; patient tolerated Venofer 10/01/18 s rxn"   . Oxybutynin Other (See Comments)    Hallucinations    . Vancomycin Other (See Comments)    ARF 05-2016 -- affects kidneys    Antimicrobials this admission: 11/16 zosyn >> 11/16 11/16 daptomycin >> 11/16 11/16 TMP/SMZ 11/16 mero >>  Microbiology results: 11/16 BCx x1 from [facility]: NGTD 11/16 UCx:    Thank you for allowing pharmacy to be a part of this patient's care.  Doreene Eland, PharmD, BCPS.   Work Cell: [phone] 04/01/2019 11:40 AM

## 2019-04-01 NOTE — Progress Notes (Signed)
Initial Nutrition Assessment  RD working remotely.  DOCUMENTATION CODES:   Not applicable, suspect some degree of malnutrition but RD unable to confirm at this time  INTERVENTION:   - Recommend liberalizing diet to Regular  - Continue MVI with minerals daily  - Continue vitamin C 500 mg po daily  - Continue zinc sulfate 220 mg po daily  - Continue 1 packet Juven BID, each packet provides 95 calories, 2.5 grams of protein, and 9.8 grams of carbohydrate; also contains of L-arginine and L-glutamine, vitamin C, vitamin E, vitamin B-12, zinc, calcium, and calcium Beta-hydroxy-Beta-methylbutyrate to support wound healing  - Ensure Enlive po TID, each supplement provides 350 kcal and 20 grams of protein  - Encourage adequate PO intake  - d/c Ensure Max  NUTRITION DIAGNOSIS:   Increased nutrient needs related to wound healing, acute illness as evidenced by estimated needs.  GOAL:   Patient will meet greater than or equal to 90% of their needs  MONITOR:   PO intake, Supplement acceptance, Labs, Weight trends, Skin, I & O's  REASON FOR ASSESSMENT:   Consult Assessment of nutrition requirement/status  ASSESSMENT:   53 year old male who presented to the ED on 11/16 after noting dislodgment of his suprapubic catheter. PMH of quadriplegia, chronic respiratory failure, depression, GERD, HTN, anemia, OSA on CPAP, chronic suprapubic catheter, diverting ostomy, chronic sacral decubitus ulcer with osteomyelitis. Pt with recent admission 03/19/19-03/27/19 for infected sacral decubitus ulcer and was found to have bacteremia. Pt admitted with sepsis, acute lower UTI.   Unable to speak with pt at this time.  Reviewed weight readings in chart. Weight has slowly been trending down over the last year. Suspect weight of 106.6 kg on 03/27/19 is an error given it is an outlier. During the last 1 year, weight has trended down from 95 kg to 76 kg. Pt with an overall weight loss of 19.1 kg over the  last 1 year. This is a 20% weight loss which is significant for timeframe.  Strongly suspect pt with some degree of malnutrition but RD unable to confirm without NFPE or diet history.  RD will d/c Ensure Max and order Ensure Enlive given pt's increased kcal and protein needs. Will continue with Juven, MVI, vitamin C, and zinc. Recommend liberalizing diet to Regular. Have reached out to MD regarding this and awaiting response.  No meal completions documented at this time.  Medications reviewed and include: ferrous sulfate, magnesium oxide 400 mg daily, Reglan 10 mg TID, MVI with minerals, Juven BID, Protonix, Klor-con 40 mEq daily, Ensure Max daily, sucralfate, vitamin C 500 mg daily, zinc sulfate 220 mg daily, IV abx IVF: NS @ 100 ml/hr  Labs reviewed: magnesium 1.3, hemoglobin 9.0  NUTRITION - FOCUSED PHYSICAL EXAM:  Unable to complete at this time. RD working remotely.  Diet Order:   Diet Order            Diet Heart Room service appropriate? Yes; Fluid consistency: Thin  Diet effective now              EDUCATION NEEDS:   Not appropriate for education at this time  Skin:  Skin Assessment: Skin Integrity Issues: (per WOC note 11/16) Skin Integrity Issues: Stage III: left trochanter, right upper thigh, right trochanter Stage IV: right posterior flank Unstageable: right posterior flank, right trochanter Other: open wound to buttocks and posterior scrotum  Last BM:  colostomy  Height:   Ht Readings from Last 1 Encounters:  04/01/19 6' (1.829 m)  Weight:   Wt Readings from Last 1 Encounters:  04/01/19 76.2 kg    Ideal Body Weight:  72.8 kg  BMI:  Body mass index is 22.78 kg/m.  Estimated Nutritional Needs:   Kcal:  2100-2300  Protein:  120-135 grams  Fluid:  >/= 2.0 L    [name], MS, RD, LDN Inpatient Clinical Dietitian Pager: [phone] Weekend/After Hours: [phone]

## 2019-04-01 NOTE — Progress Notes (Signed)
Pharmacy Antibiotic Note  [name] [name] is a 53 y.o. male admitted on 04/01/2019 with sepsis.  Pharmacy has been consulted for zosyn and cubicin dosing.  Plan: Zosyn 3.375g IV q8h (4 hour infusion).  Daptomycin 500 mg x1 (~4.7 mg/kg) - will need ID consult to continue F/u scr/cultures/levels  Height: 6' (182.9 cm) IBW/kg (Calculated) : 77.6  Temp (24hrs), Avg:98.8 F (37.1 C), Min:98.8 F (37.1 C), Max:98.8 F (37.1 C)  Recent Labs  Lab 03/26/19 1122 03/26/19 1309 03/27/19 0128 04/01/19 0240  WBC  --  9.0 9.5 16.8*  CREATININE <0.30*  --  <0.30* 0.40*  LATICACIDVEN  --   --   --  0.9    Estimated Creatinine Clearance: 136.3 mL/min (A) (by C-G formula based on SCr of 0.4 mg/dL (L)).    Allergies  Allergen Reactions  . Ferumoxytol Anaphylaxis and Other (See Comments)    (Feraheme) = "SYNCOPE; patient tolerated Venofer 10/01/18 s rxn"   . Oxybutynin Other (See Comments)    Hallucinations    . Vancomycin Other (See Comments)    ARF 05-2016 -- affects kidneys     Antimicrobials this admission: PTA meropenem/septra >>  11/16 zosyn >>  11/16 daptomycin >>  Dose adjustments this admission:   Microbiology results:  BCx:   UCx:    Sputum:    MRSA PCR:   Thank you for allowing pharmacy to be a part of this patient's care.  Dorrene German 04/01/2019 5:06 AM

## 2019-04-02 LAB — CBC
HCT: 24.6 % — ABNORMAL LOW (ref 39.0–52.0)
Hemoglobin: 7.6 g/dL — ABNORMAL LOW (ref 13.0–17.0)
MCH: 26.1 pg (ref 26.0–34.0)
MCHC: 30.9 g/dL (ref 30.0–36.0)
MCV: 84.5 fL (ref 80.0–100.0)
Platelets: 391 10*3/uL (ref 150–400)
RBC: 2.91 MIL/uL — ABNORMAL LOW (ref 4.22–5.81)
RDW: 18 % — ABNORMAL HIGH (ref 11.5–15.5)
WBC: 13.9 10*3/uL — ABNORMAL HIGH (ref 4.0–10.5)
nRBC: 0 % (ref 0.0–0.2)

## 2019-04-02 LAB — COMPREHENSIVE METABOLIC PANEL
ALT: 18 U/L (ref 0–44)
AST: 21 U/L (ref 15–41)
Albumin: 1.1 g/dL — ABNORMAL LOW (ref 3.5–5.0)
Alkaline Phosphatase: 79 U/L (ref 38–126)
Anion gap: 5 (ref 5–15)
BUN: 8 mg/dL (ref 6–20)
CO2: 22 mmol/L (ref 22–32)
Calcium: 7.2 mg/dL — ABNORMAL LOW (ref 8.9–10.3)
Chloride: 110 mmol/L (ref 98–111)
Creatinine, Ser: <0.3 mg/dL — ABNORMAL LOW (ref 0.61–1.24)
Glucose, Bld: 73 mg/dL (ref 70–99)
Potassium: 4.2 mmol/L (ref 3.5–5.1)
Sodium: 137 mmol/L (ref 135–145)
Total Bilirubin: 0.1 mg/dL — ABNORMAL LOW (ref 0.3–1.2)
Total Protein: 4.3 g/dL — ABNORMAL LOW (ref 6.5–8.1)

## 2019-04-02 LAB — GLUCOSE, CAPILLARY: Glucose-Capillary: 75 mg/dL (ref 70–99)

## 2019-04-02 LAB — PROCALCITONIN: Procalcitonin: <0.1 ng/mL

## 2019-04-02 LAB — MAGNESIUM: Magnesium: 1.4 mg/dL — ABNORMAL LOW (ref 1.7–2.4)

## 2019-04-02 MED ORDER — SODIUM CHLORIDE 0.9% FLUSH
10.0000 mL | INTRAVENOUS | Status: DC | PRN
  Administered 2019-04-05: 30 mL
  Filled 2019-04-02: qty 40
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
## 2019-04-03 DIAGNOSIS — A419 Sepsis, unspecified organism: Secondary | ICD-10-CM

## 2019-04-03 DIAGNOSIS — I313 Pericardial effusion (noninflammatory): Secondary | ICD-10-CM

## 2019-04-03 LAB — URINE CULTURE

## 2019-04-03 LAB — CBC WITH DIFFERENTIAL/PLATELET
Abs Immature Granulocytes: 0.09 10*3/uL — ABNORMAL HIGH (ref 0.00–0.07)
Basophils Absolute: 0.1 10*3/uL (ref 0.0–0.1)
Basophils Relative: 0 %
Eosinophils Absolute: 0.4 10*3/uL (ref 0.0–0.5)
Eosinophils Relative: 3 %
HCT: 24.7 % — ABNORMAL LOW (ref 39.0–52.0)
Hemoglobin: 7.6 g/dL — ABNORMAL LOW (ref 13.0–17.0)
Immature Granulocytes: 1 %
Lymphocytes Relative: 16 %
Lymphs Abs: 2.3 10*3/uL (ref 0.7–4.0)
MCH: 26.4 pg (ref 26.0–34.0)
MCHC: 30.8 g/dL (ref 30.0–36.0)
MCV: 85.8 fL (ref 80.0–100.0)
Monocytes Absolute: 1.8 10*3/uL — ABNORMAL HIGH (ref 0.1–1.0)
Monocytes Relative: 12 %
Neutro Abs: 10 10*3/uL — ABNORMAL HIGH (ref 1.7–7.7)
Neutrophils Relative %: 68 %
Platelets: 415 10*3/uL — ABNORMAL HIGH (ref 150–400)
RBC: 2.88 MIL/uL — ABNORMAL LOW (ref 4.22–5.81)
RDW: 18.4 % — ABNORMAL HIGH (ref 11.5–15.5)
WBC: 14.6 10*3/uL — ABNORMAL HIGH (ref 4.0–10.5)
nRBC: 0 % (ref 0.0–0.2)

## 2019-04-03 LAB — BASIC METABOLIC PANEL
Anion gap: 2 — ABNORMAL LOW (ref 5–15)
BUN: 9 mg/dL (ref 6–20)
CO2: 23 mmol/L (ref 22–32)
Calcium: 7.2 mg/dL — ABNORMAL LOW (ref 8.9–10.3)
Chloride: 109 mmol/L (ref 98–111)
Creatinine, Ser: <0.3 mg/dL — ABNORMAL LOW (ref 0.61–1.24)
Glucose, Bld: 83 mg/dL (ref 70–99)
Potassium: 5 mmol/L (ref 3.5–5.1)
Sodium: 134 mmol/L — ABNORMAL LOW (ref 135–145)

## 2019-04-03 LAB — PROCALCITONIN: Procalcitonin: <0.1 ng/mL

## 2019-04-03 LAB — MAGNESIUM: Magnesium: 1.5 mg/dL — ABNORMAL LOW (ref 1.7–2.4)

## 2019-04-03 MED ORDER — MAGNESIUM SULFATE 2 GM/50ML IV SOLN
2.0000 g | Freq: Once | INTRAVENOUS | Status: AC
  Administered 2019-04-03: 2 g via INTRAVENOUS
  Filled 2019-04-03: qty 50

## 2019-04-03 MED ORDER — PROMETHAZINE HCL 25 MG/ML IJ SOLN
12.5000 mg | Freq: Four times a day (QID) | INTRAMUSCULAR | Status: DC | PRN
  Administered 2019-04-03 – 2019-04-05 (×2): 12.5 mg via INTRAVENOUS
  Filled 2019-04-03 (×2): qty 1

## 2019-04-03 MED ORDER — CHLORHEXIDINE GLUCONATE CLOTH 2 % EX PADS
6.0000 | MEDICATED_PAD | Freq: Every day | CUTANEOUS | Status: DC
  Administered 2019-04-03 – 2019-04-05 (×3): 6 via TOPICAL

## 2019-04-03 MED ORDER — SODIUM CHLORIDE 0.9 % IV SOLN
INTRAVENOUS | Status: DC | PRN
  Administered 2019-04-03: 1000 mL via INTRAVENOUS

## 2019-04-03 MED ORDER — ALBUMIN HUMAN 25 % IV SOLN
12.5000 g | Freq: Once | INTRAVENOUS | Status: AC
  Administered 2019-04-03: 12.5 g via INTRAVENOUS
  Filled 2019-04-03: qty 50

## 2019-04-03 MED ORDER — FUROSEMIDE 10 MG/ML IJ SOLN
40.0000 mg | Freq: Two times a day (BID) | INTRAMUSCULAR | Status: DC
  Administered 2019-04-03 – 2019-04-04 (×2): 40 mg via INTRAVENOUS
  Filled 2019-04-03 (×2): qty 4

## 2019-04-03 MED ORDER — FUROSEMIDE 10 MG/ML IJ SOLN
40.0000 mg | Freq: Every day | INTRAMUSCULAR | Status: DC
  Administered 2019-04-03: 40 mg via INTRAVENOUS
  Filled 2019-04-03: qty 4

## 2019-04-03 MED ORDER — FOSFOMYCIN TROMETHAMINE 3 G PO PACK
3.0000 g | PACK | Freq: Once | ORAL | Status: AC
  Administered 2019-04-03: 3 g via ORAL
  Filled 2019-04-03: qty 3

## 2019-04-03 MED ORDER — MIDODRINE HCL 5 MG PO TABS
10.0000 mg | ORAL_TABLET | Freq: Once | ORAL | Status: AC
  Administered 2019-04-03: 10 mg via ORAL
  Filled 2019-04-03: qty 2

## 2019-04-03 NOTE — Progress Notes (Signed)
Pts BP 77/44 MAP: 54. Pt missed afternoon dose of Midodrine 10mg  due to nausea. Will reorder Midodrine now as well as Albumin 12.5gm x1.  Arby Barrette AGPCNP-BC, AGNP-C [facility] Pager [phone]

## 2019-04-03 NOTE — Progress Notes (Signed)
All wound dressings changed with saline gauze, foam and abd @ 04/03/2019 0504

## 2019-04-03 NOTE — Progress Notes (Signed)
PHARMACY - PHYSICIAN COMMUNICATION CRITICAL VALUE ALERT - BLOOD CULTURE IDENTIFICATION (BCID)  [name] [name] is an 53 y.o. male who presented to [facility] on 04/01/2019 with a chief complaint of  suprapubic catheter falling out  Name of physician (or Provider) Contacted: Dr. Dorien Chihuahua  Current antibiotics: merrem and bactrim  Changes to prescribed antibiotics recommended:  none  Results for orders placed or performed during the hospital encounter of 03/19/19  Blood Culture ID Panel (Reflexed) (Collected: 03/19/2019  3:15 PM)  Result Value Ref Range   Enterococcus species NOT DETECTED NOT DETECTED   Listeria monocytogenes NOT DETECTED NOT DETECTED   Staphylococcus species NOT DETECTED NOT DETECTED   Staphylococcus aureus (BCID) NOT DETECTED NOT DETECTED   Streptococcus species NOT DETECTED NOT DETECTED   Streptococcus agalactiae NOT DETECTED NOT DETECTED   Streptococcus pneumoniae NOT DETECTED NOT DETECTED   Streptococcus pyogenes NOT DETECTED NOT DETECTED   Acinetobacter baumannii DETECTED (A) NOT DETECTED   Enterobacteriaceae species NOT DETECTED NOT DETECTED   Enterobacter cloacae complex NOT DETECTED NOT DETECTED   Escherichia coli NOT DETECTED NOT DETECTED   Klebsiella oxytoca NOT DETECTED NOT DETECTED   Klebsiella pneumoniae NOT DETECTED NOT DETECTED   Proteus species NOT DETECTED NOT DETECTED   Serratia marcescens NOT DETECTED NOT DETECTED   Carbapenem resistance NOT DETECTED NOT DETECTED   Haemophilus influenzae NOT DETECTED NOT DETECTED   Neisseria meningitidis NOT DETECTED NOT DETECTED   Pseudomonas aeruginosa NOT DETECTED NOT DETECTED   Candida albicans NOT DETECTED NOT DETECTED   Candida glabrata NOT DETECTED NOT DETECTED   Candida krusei NOT DETECTED NOT DETECTED   Candida parapsilosis NOT DETECTED NOT DETECTED   Candida tropicalis NOT DETECTED NOT DETECTED    Dolly Rias RPh 04/03/2019, 12:54 PM
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
## 2019-04-03 NOTE — Progress Notes (Addendum)
Patient's HR sustaining in low 100's and BP remains low with MAP staying around 65. Dr. Tawanna Solo notified by RN. Orders to continue current plan of treatment. RN will continue to monitor.
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
## 2019-04-04 LAB — CBC WITH DIFFERENTIAL/PLATELET
Abs Immature Granulocytes: 0.09 10*3/uL — ABNORMAL HIGH (ref 0.00–0.07)
Basophils Absolute: 0.1 10*3/uL (ref 0.0–0.1)
Basophils Relative: 0 %
Eosinophils Absolute: 0.2 10*3/uL (ref 0.0–0.5)
Eosinophils Relative: 1 %
HCT: 24.3 % — ABNORMAL LOW (ref 39.0–52.0)
Hemoglobin: 7.7 g/dL — ABNORMAL LOW (ref 13.0–17.0)
Immature Granulocytes: 1 %
Lymphocytes Relative: 16 %
Lymphs Abs: 2.4 10*3/uL (ref 0.7–4.0)
MCH: 26.8 pg (ref 26.0–34.0)
MCHC: 31.7 g/dL (ref 30.0–36.0)
MCV: 84.7 fL (ref 80.0–100.0)
Monocytes Absolute: 1.7 10*3/uL — ABNORMAL HIGH (ref 0.1–1.0)
Monocytes Relative: 11 %
Neutro Abs: 10.5 10*3/uL — ABNORMAL HIGH (ref 1.7–7.7)
Neutrophils Relative %: 71 %
Platelets: 419 10*3/uL — ABNORMAL HIGH (ref 150–400)
RBC: 2.87 MIL/uL — ABNORMAL LOW (ref 4.22–5.81)
RDW: 18.6 % — ABNORMAL HIGH (ref 11.5–15.5)
WBC: 14.8 10*3/uL — ABNORMAL HIGH (ref 4.0–10.5)
nRBC: 0 % (ref 0.0–0.2)

## 2019-04-04 LAB — CREATININE, SERUM
Creatinine, Ser: 0.32 mg/dL — ABNORMAL LOW (ref 0.61–1.24)
GFR calc Af Amer: >60 mL/min (ref >60)
GFR calc non Af Amer: >60 mL/min (ref >60)

## 2019-04-04 LAB — CULTURE, BLOOD (ROUTINE X 2): Special Requests: ADEQUATE

## 2019-04-04 LAB — MAGNESIUM: Magnesium: 1.7 mg/dL (ref 1.7–2.4)

## 2019-04-04 MED ORDER — FUROSEMIDE 20 MG PO TABS
20.0000 mg | ORAL_TABLET | Freq: Two times a day (BID) | ORAL | Status: DC
  Administered 2019-04-05 (×2): 20 mg via ORAL
  Filled 2019-04-04: qty 1

## 2019-04-04 MED ORDER — MIDODRINE HCL 5 MG PO TABS
10.0000 mg | ORAL_TABLET | Freq: Three times a day (TID) | ORAL | Status: DC
  Administered 2019-04-04 – 2019-04-05 (×5): 10 mg via ORAL
  Filled 2019-04-04 (×5): qty 2

## 2019-04-04 NOTE — Progress Notes (Addendum)
Patient's MAP remaining low around 57-65. Dr. Tawanna Solo paged by RN. Scheduled Midodrine given and lasix dosage changed to 20 mg bid ; RN will continue to assess pt.
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
## 2019-04-05 ENCOUNTER — Inpatient Hospital Stay: Payer: Medicare Other | Admitting: Infectious Diseases

## 2019-04-05 LAB — CBC WITH DIFFERENTIAL/PLATELET
Abs Immature Granulocytes: 0.09 10*3/uL — ABNORMAL HIGH (ref 0.00–0.07)
Basophils Absolute: 0.1 10*3/uL (ref 0.0–0.1)
Basophils Relative: 0 %
Eosinophils Absolute: 0.2 10*3/uL (ref 0.0–0.5)
Eosinophils Relative: 2 %
HCT: 26.4 % — ABNORMAL LOW (ref 39.0–52.0)
Hemoglobin: 8.1 g/dL — ABNORMAL LOW (ref 13.0–17.0)
Immature Granulocytes: 1 %
Lymphocytes Relative: 17 %
Lymphs Abs: 2.4 10*3/uL (ref 0.7–4.0)
MCH: 26 pg (ref 26.0–34.0)
MCHC: 30.7 g/dL (ref 30.0–36.0)
MCV: 84.6 fL (ref 80.0–100.0)
Monocytes Absolute: 1.4 10*3/uL — ABNORMAL HIGH (ref 0.1–1.0)
Monocytes Relative: 10 %
Neutro Abs: 10.2 10*3/uL — ABNORMAL HIGH (ref 1.7–7.7)
Neutrophils Relative %: 70 %
Platelets: 481 10*3/uL — ABNORMAL HIGH (ref 150–400)
RBC: 3.12 MIL/uL — ABNORMAL LOW (ref 4.22–5.81)
RDW: 18.9 % — ABNORMAL HIGH (ref 11.5–15.5)
WBC: 14.4 10*3/uL — ABNORMAL HIGH (ref 4.0–10.5)
nRBC: 0 % (ref 0.0–0.2)

## 2019-04-05 LAB — BASIC METABOLIC PANEL
Anion gap: 7 (ref 5–15)
BUN: 9 mg/dL (ref 6–20)
CO2: 26 mmol/L (ref 22–32)
Calcium: 7.7 mg/dL — ABNORMAL LOW (ref 8.9–10.3)
Chloride: 103 mmol/L (ref 98–111)
Creatinine, Ser: 0.32 mg/dL — ABNORMAL LOW (ref 0.61–1.24)
GFR calc Af Amer: >60 mL/min (ref >60)
GFR calc non Af Amer: >60 mL/min (ref >60)
Glucose, Bld: 97 mg/dL (ref 70–99)
Potassium: 4.6 mmol/L (ref 3.5–5.1)
Sodium: 136 mmol/L (ref 135–145)

## 2019-04-05 MED ORDER — FUROSEMIDE 20 MG PO TABS: 20.0000 mg | ORAL_TABLET | Freq: Two times a day (BID) | ORAL | 0 refills | Status: DC

## 2019-04-05 MED ORDER — MIDODRINE HCL 10 MG PO TABS: 10.0000 mg | ORAL_TABLET | Freq: Three times a day (TID) | ORAL | 0 refills | Status: DC

## 2019-04-05 NOTE — TOC Transition Note (Signed)
Transition of Care [facility]) - CM/SW Discharge Note   Patient Details  Name: [name] [name] MRN: [id number] Date of Birth: 1966-01-08  [facility] CM/SW Contact:  Nila Nephew, LCSW Phone Number: [phone] 04/05/2019, 2:27 PM   Clinical Narrative:   Pt discharging home today, PTAR arranged, [employer] and [employer] representatives aware.      Patient Goals and CMS Choice        Discharge Placement                  Name of family member notified: pt told sister [name] Patient and family notified of of transfer: 04/05/19  Discharge Plan and Services      HH Arranged: RN, IV Antibiotics, Nurse's Aide   Date [facility] Agency Contacted: 04/05/19 Time [place]: 60 Representative spoke with at [place]: [place]    Readmission Risk Interventions Readmission Risk Prevention Plan 04/01/2019 03/27/2019 03/07/2019  Transportation Screening Complete Complete Complete  PCP or Specialist Appt within 5-7 Days - - -  PCP or Specialist Appt within 3-5 Days - - Complete  Not Complete comments - - -  Home Care Screening - - -  Medication Review (RN CM) - - -  [place] or [place] - - Complete  Social Work Consult for [place] Planning/Counseling - - Complete  Palliative Care Screening - - Not Applicable  Medication Review (RN Care Manager) Referral to Pharmacy Complete Referral to Pharmacy  PCP or Specialist appointment within 3-5 days of discharge Not Complete Complete -  PCP/Specialist Appt Not Complete comments DC date unknown - -  HRI or Home Care Consult Complete Complete -  SW Recovery Care/Counseling Consult - Complete -  Palliative Care Screening Not Complete Not Applicable -  Comments no desire - -  [place] Not Applicable Not Applicable -  Some recent data might be hidden

## 2019-04-05 NOTE — Discharge Summary (Signed)
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 REFILLS for any discharge medications will be authorized once you are discharged, as it is imperative that you return to your primary care physician (or establish a relationship with a primary care physician if you do not have one) for your post hospital discharge needs so that they can reassess your need for medications and monitor your lab values.    Time coordinating discharge: 40 minutes  SIGNED:   Shelly Coss, MD  [facility] 04/05/2019, 10:31 AM Pager [phone]  If 7PM-7AM, please contact night-coverage [email or web address] Physician Discharge Summary  ALVER LEETE WLN:[id number] DOB: 28-Apr-1966 DOA: 04/01/2019  PCP: Andria Frames, PA-C  Admit date: 04/01/2019 Discharge date: 04/05/2019  Admitted From: Home Disposition:  Home  Discharge Condition:Stable CODE STATUS:FULL, DNR, Comfort Care Diet recommendation: Heart Healthy / Carb Modified / Regular / Dysphagia   Brief/Interim Summary: You may copy/paste interim summary or write brief hospital course depending on length of stay  Discharge Diagnoses:  Principal Problem:   Sepsis ([place]) Active Problems:   Sacral decubitus ulcer, stage IV ([name]   Iron deficiency anemia   Acute lower UTI   Hypotension    Discharge Instructions  Discharge Instructions    Diet - low sodium heart healthy   Complete by: As directed    Discharge instructions   Complete by: As directed    1)Please take prescribed medications as instructed. 2)Follow up with your PCP in a week.Do a CBC and BMP tests during the follow up.   Increase activity slowly   Complete by: As directed      Allergies as of 04/05/2019      Reactions   Ferumoxytol Anaphylaxis, Other (See Comments)   (Feraheme) = "SYNCOPE; patient tolerated Venofer 10/01/18 s rxn"   Oxybutynin Other (See Comments)   Hallucinations    Vancomycin Other (See Comments)   ARF 05-2016 -- affects kidneys      Medication List    TAKE these medications   acetaminophen 325 MG tablet Commonly known as: TYLENOL Take 2 tablets (650 mg total) by mouth every 6 (six) hours as needed for mild pain, fever or headache.   albuterol 108 (90 Base) MCG/ACT inhaler Commonly known as: VENTOLIN HFA Inhale 2 puffs into the lungs every 6 (six) hours as needed for wheezing or shortness of breath.   baclofen 20 MG tablet Commonly known as: LIORESAL Take 20 mg by mouth 2 (two) times daily.    Chlorhexidine Gluconate Cloth 2 % Pads Apply 6 each topically daily. Use as directed   ferrous sulfate 325 (65 FE) MG tablet TAKE 1 TABLET (325 MG TOTAL) BY MOUTH 3 (THREE) TIMES DAILY WITH MEALS. What changed: See the new instructions.   furosemide 20 MG tablet Commonly known as: LASIX Take 1 tablet (20 mg total) by mouth 2 (two) times daily.   guaiFENesin 100 MG/5ML Soln Commonly known as: ROBITUSSIN Take 5 mLs (100 mg total) by mouth every 4 (four) hours as needed for cough or to loosen phlegm.   ipratropium 17 MCG/ACT inhaler Commonly known as: ATROVENT HFA Inhale 1 puff into the lungs 3 (three) times daily.   levalbuterol 45 MCG/ACT inhaler Commonly known as: XOPENEX HFA Inhale 1 puff into the lungs every 4 (four) hours as needed for wheezing.   magnesium oxide 400 MG tablet Commonly known as:  MAG-OX Take 400 mg by mouth daily with breakfast.   meropenem  IVPB Commonly known as: MERREM Inject 1 g into the vein every 8 (eight) hours for 18 days. Indication:  Osteomyelitis  Last Day of Therapy:  04/09/2019 Labs - Once weekly:  CBC/D and BMP, Labs - Every other week:  ESR and CRP   metoCLOPramide 10 MG tablet Commonly known as: REGLAN Take 1 tablet (10 mg total) by mouth 3 (three) times daily with meals.   midodrine 10 MG tablet Commonly known as: PROAMATINE Take 1 tablet (10 mg total) by mouth 3 (three) times daily with meals.   mouth rinse Liqd solution 15 mLs by Mouth Rinse route 2 (two) times daily.   multivitamin with minerals Tabs tablet Take 1 tablet by mouth every morning.   nutrition supplement (JUVEN) Pack Take 1 packet by mouth 2 (two) times daily between meals.   Ensure Max Protein Liqd Take 330 mLs (11 oz total) by mouth daily.   ondansetron 8 MG disintegrating tablet Commonly known as: Zofran ODT Take 1 tablet (8 mg total) by mouth every 8 (eight) hours as needed for nausea or vomiting. What changed: reasons to take this   pantoprazole 40  MG tablet Commonly known as: PROTONIX Take 40 mg by mouth 2 (two) times daily.   polyethylene glycol 17 g packet Commonly known as: MIRALAX / GLYCOLAX Take 17 g by mouth daily as needed for moderate constipation or severe constipation.   potassium chloride SA 20 MEQ tablet Commonly known as: KLOR-CON Take 2 tablets (40 mEq total) by mouth daily.   PROBIOTIC PO Take 1 capsule by mouth daily.   Santyl ointment Generic drug: collagenase Apply 1 application topically See admin instructions. Apply daily as directed to affected area(s) of the right hip   senna-docusate 8.6-50 MG tablet Commonly known as: Senokot-S Take 2 tablets by mouth at bedtime as needed for mild constipation or moderate constipation.   simethicone 80 MG chewable tablet Commonly known as: Gas-X Chew 1 tablet (80 mg total) by mouth 4 (four) times daily as needed for flatulence.   sucralfate 1 g tablet Commonly known as: CARAFATE Take 1 tablet (1 g total) by mouth 4 (four) times daily.   sulfamethoxazole-trimethoprim 800-160 MG tablet Commonly known as: BACTRIM DS Take 2 tablets by mouth every 12 (twelve) hours. Take 2 tabs BID until 04/05/19   Toviaz 8 MG Tb24 tablet Generic drug: fesoterodine Take 8 mg by mouth every morning.   vitamin C 500 MG tablet Commonly known as: ASCORBIC ACID Take 500 mg by mouth daily.   Zinc 50 MG Tabs Take 50 mg by mouth 2 (two) times daily.      Follow-up Information    Park Meo T, PA-C. Schedule an appointment as soon as possible for a visit in 1 week(s).   Specialty: Physician Assistant Contact information: [place] [address] [phone]          Allergies  Allergen Reactions  . Ferumoxytol Anaphylaxis and Other (See Comments)    (Feraheme) = "SYNCOPE; patient tolerated Venofer 10/01/18 s rxn"   . Oxybutynin Other (See Comments)    Hallucinations    . Vancomycin Other (See Comments)    ARF 05-2016 -- affects kidneys      Consultations:  Cardiology   Procedures/Studies: Dg Abd 1 View  Result Date: 03/23/2019 CLINICAL DATA:  NG tube placement. EXAM: ABDOMEN - 1 VIEW COMPARISON:  Abdominal radiograph 03/22/2019 FINDINGS: Enteric tube tip and  side-port project within the right upper quadrant, likely within the gastric antrum/proximal duodenum. Heterogeneous opacities lung bases bilaterally. Mildly gaseous distended small bowel and colon. IMPRESSION: Enteric tube tip and side-port project of the gastric antrum/proximal duodenum. Gaseous distended loops of bowel raising the possibility of ileus. Electronically Signed   By: Lovey Newcomer M.D.   On: 03/23/2019 13:57   Dg Abd 1 View  Result Date: 03/22/2019 CLINICAL DATA:  Abdominal pain and distention, contracted EXAM: ABDOMEN - 1 VIEW COMPARISON:  Radiograph 01/27/2019 FINDINGS: Marked gaseous distention of the stomach. Numerous air-filled loops of both large and small bowel project over the abdomen with air and stool projecting over the distal colon. Suboptimal imaging quality due to patient contractions. Multilevel degenerative changes are present in the imaged portions of the spine. Lung bases are clear. Surgical clips in the right upper quadrant. No suspicious calcifications. IMPRESSION: Marked gaseous distention of the stomach. Numerous air-filled loops of both large and small bowel project suggestive of an ileus or early bowel obstruction. Electronically Signed   By: Lovena Le M.D.   On: 03/22/2019 21:09   Ir Catheter Tube Change  Result Date: 03/07/2019 INDICATION: URINARY TRACT INFECTION, CHRONIC PUBIC CATHETER EXAM: FLUOROSCOPIC EXCHANGE CHRONIC SUPRAPUBIC CATHETER MEDICATIONS: The patient is currently admitted to the hospital and receiving intravenous antibiotics. The antibiotics were administered within an appropriate time frame prior to the initiation of the procedure. ANESTHESIA/SEDATION: Moderate Sedation Time:  NONE. The patient was continuously  monitored during the procedure by the interventional radiology nurse under my direct supervision. COMPLICATIONS: None immediate. PROCEDURE: Informed written consent was obtained from the patient after a thorough discussion of the procedural risks, benefits and alternatives. All questions were addressed. Maximal Sterile Barrier Technique was utilized including caps, mask, sterile gowns, sterile gloves, sterile drape, hand hygiene and skin antiseptic. A timeout was performed prior to the initiation of the procedure. Under sterile conditions, the existing 22 French Foley balloon tip suprapubic catheter was exchanged for a 22 French balloon tip catheter. Retention balloon inflated with 10 cc saline containing 1 cc contrast. Position confirmed with fluoroscopic injection. Images obtained for documentation. Patient tolerated the procedure well. IMPRESSION: Fluoroscopic exchange of the 22 French balloon retention suprapubic catheter. Electronically Signed   By: Jerilynn Mages.  Shick M.D.   On: 03/07/2019 16:52   Dg Chest Port 1 View  Result Date: 04/01/2019 CLINICAL DATA:  Congestion, shortness of breath EXAM: PORTABLE CHEST 1 VIEW COMPARISON:  03/27/2019 FINDINGS: Right Port-A-Cath remains in place, unchanged. Mild cardiomegaly. Small bilateral effusions. Bibasilar atelectasis. No edema. No acute bony abnormality. IMPRESSION: Cardiomegaly. Small bilateral effusions with bibasilar atelectasis. Electronically Signed   By: Rolm Baptise M.D.   On: 04/01/2019 02:56   Dg Chest Port 1 View  Result Date: 03/27/2019 CLINICAL DATA:  53 year old male with cough and congestion. EXAM: PORTABLE CHEST 1 VIEW COMPARISON:  Chest radiograph dated 03/25/2019 FINDINGS: There are small bilateral pleural effusions similar to prior radiograph. Bilateral mid to lower lung field hazy densities may represent atelectasis or infiltrate. Overall no significant interval change since the prior radiograph. No pneumothorax. Stable cardiomegaly. No acute  osseous pathology. IMPRESSION: No significant interval change in the small bilateral pleural effusions and bilateral mid to lower lung field hazy densities. Electronically Signed   By: Anner Crete M.D.   On: 03/27/2019 09:45   Dg Chest Port 1 View  Result Date: 03/25/2019 CLINICAL DATA:  Shortness of breath EXAM: PORTABLE CHEST 1 VIEW COMPARISON:  03/22/2019 FINDINGS: Cardiac shadow is stable. Right  chest wall port is again seen. Gastric catheter is noted extending into the stomach. Lungs are well aerated bilaterally. Increasing patchy infiltrate is noted within the right mid and lower lung when compare with the prior exam. No sizable effusion is seen. Old left rib fractures are noted. IMPRESSION: Patchy infiltrates within the right mid and lower lung. Electronically Signed   By: Inez Catalina M.D.   On: 03/25/2019 01:08   Dg Chest Port 1 View  Result Date: 03/22/2019 CLINICAL DATA:  Shortness of breath EXAM: PORTABLE CHEST 1 VIEW COMPARISON:  March 19, 2019 FINDINGS: The right-sided Port-A-Cath is stable in positioning. The heart size remains enlarged. There is no pneumothorax. There are small bilateral pleural effusions. Bibasilar atelectasis versus consolidation is again noted. There is a stable appearance of the posterior fifth rib on the right. There are old healed left-sided rib fractures. IMPRESSION: 1. Lines and tubes as above. 2. Small bilateral pleural effusions with persistent bibasilar airspace disease which may represent atelectasis or pneumonia. Electronically Signed   By: Constance Holster M.D.   On: 03/22/2019 19:15   Dg Chest Port 1 View  Result Date: 03/19/2019 CLINICAL DATA:  Fever since Sunday. EXAM: PORTABLE CHEST 1 VIEW COMPARISON:  03/05/2019 FINDINGS: Right-sided Port-A-Cath remains in place, unchanged. Heart and mediastinal contours accentuated by rotation. Increase in density in retrocardiac region since prior study. Deformity of we will left-sided ribs unchanged.  Expansile destructive process associated with right posterior fifth rib is similar. IMPRESSION: 1. Increasing density in the retrocardiac region. Atelectasis or developing pneumonia is considered. Study limited by rotation. 2. No other interval changes. Electronically Signed   By: Zetta Bills M.D.   On: 03/19/2019 16:17   [place] Ekg Site Rite  Result Date: 04/01/2019 If Site Rite image not attached, placement could not be confirmed due to current cardiac rhythm.      Subjective:   Discharge Exam: Vitals:   04/05/19 0815 04/05/19 0821  BP:  128/76  Pulse:  (!) 103  Resp:  (!) 24  Temp: 98.7 F (37.1 C)   SpO2:  100%   Vitals:   04/05/19 0500 04/05/19 0600 04/05/19 0815 04/05/19 0821  BP: (!) 90/57 (!) 111/95  128/76  Pulse:  95  (!) 103  Resp: (!) 21 18  (!) 24  Temp: (!) 97.1 F (36.2 C)  98.7 F (37.1 C)   TempSrc: Axillary  Oral   SpO2:  100%  100%  Weight:      Height:        General: Pt is alert, awake, not in acute distress Cardiovascular: RRR, S1/S2 +, no rubs, no gallops Respiratory: CTA bilaterally, no wheezing, no rhonchi Abdominal: Soft, NT, ND, bowel sounds + Extremities: no edema, no cyanosis    The results of significant diagnostics from this hospitalization (including imaging, microbiology, ancillary and laboratory) are listed below for reference.     Microbiology: Recent Results (from the past 240 hour(s))  Blood culture (routine x 2)     Status: Abnormal   Collection Time: 04/01/19  2:40 AM   Specimen: BLOOD  Result Value Ref Range Status   Specimen Description   Final    BLOOD PORTA CATH Performed at [facility] [address] [place], [place] [address]    Special Requests   Final    BOTTLES DRAWN AEROBIC AND ANAEROBIC Blood Culture adequate volume Performed at [facility] [address] [place], [place] [address]    Culture  Setup Time  Final    GRAM POSITIVE COCCI ANAEROBIC BOTTLE  ONLY CRITICAL RESULT CALLED TO, READ BACK BY AND VERIFIED WITH: [place] [id number] at 1150 am by cm    Culture (A)  Final    STAPHYLOCOCCUS SPECIES (COAGULASE NEGATIVE) THE SIGNIFICANCE OF ISOLATING THIS ORGANISM FROM A SINGLE VENIPUNCTURE CANNOT BE PREDICTED WITHOUT FURTHER CLINICAL AND CULTURE CORRELATION. SUSCEPTIBILITIES AVAILABLE ONLY ON REQUEST. Performed at [facility], [place] [address], [place], [place] [address]    Report Status 04/04/2019 FINAL  Final  Urine culture     Status: Abnormal   Collection Time: 04/01/19  2:40 AM   Specimen: Urine, Random  Result Value Ref Range Status   Specimen Description   Final    URINE, RANDOM Performed at [facility] [address], [place], [place] [address]    Special Requests   Final    NONE Performed at [facility], [place] [address] [place], [place] [address]    Culture PSEUDOMONAS AERUGINOSA (A)  Final   Report Status 04/03/2019 FINAL  Final   Organism ID, Bacteria PSEUDOMONAS AERUGINOSA  Final      Susceptibility   Pseudomonas aeruginosa - MIC*    CEFTAZIDIME 4 SENSITIVE Sensitive     CIPROFLOXACIN >=4 RESISTANT Resistant     GENTAMICIN <=1 SENSITIVE Sensitive     IMIPENEM >=16 RESISTANT Resistant     PIP/TAZO 32 SENSITIVE Sensitive     CEFEPIME 8 SENSITIVE Sensitive     * PSEUDOMONAS AERUGINOSA  SARS CORONAVIRUS 2 (TAT 6-24 HRS) Nasopharyngeal Nasopharyngeal Swab     Status: None   Collection Time: 04/01/19  4:47 AM   Specimen: Nasopharyngeal Swab  Result Value Ref Range Status   SARS Coronavirus 2 NEGATIVE NEGATIVE Final    Comment: (NOTE) SARS-CoV-2 target nucleic acids are NOT DETECTED. The SARS-CoV-2 RNA is generally detectable in upper and lower respiratory specimens during the acute phase of infection. Negative results do not preclude SARS-CoV-2 infection, do not rule out co-infections with other pathogens, and should not be used as the sole basis for treatment or  other patient management decisions. Negative results must be combined with clinical observations, patient history, and epidemiological information. The expected result is Negative. Fact Sheet for Patients: SugarRoll.be Fact Sheet for Healthcare Providers: https://www.woods-mathews.com/ This test is not yet approved or cleared by the [place] FDA and  has been authorized for detection and/or diagnosis of SARS-CoV-2 by FDA under an Emergency Use Authorization (EUA). This EUA will remain  in effect (meaning this test can be used) for the duration of the COVID-19 declaration under Section 56 4(b)(1) of the Act, 21 U.S.C. section 360bbb-3(b)(1), unless the authorization is terminated or revoked sooner. Performed at [facility], [place] [address] [place], [place] [address]      Labs: BNP (last 3 results) Recent Labs    03/23/19 0540  BNP 41.3   Basic Metabolic Panel: Recent Labs  Lab 04/01/19 0240 04/02/19 0500 04/03/19 0346 04/04/19 0345 04/05/19 0904  NA 137 137 134*  --  136  K 4.0 4.2 5.0  --  4.6  CL 107 110 109  --  103  CO2 26 22 23   --  26  GLUCOSE 104* 73 83  --  97  BUN 11 8 9   --  9  CREATININE 0.40* <0.30* <0.30* 0.32* 0.32*  CALCIUM 7.7* 7.2* 7.2*  --  7.7*  MG 1.3* 1.4* 1.5* 1.7  --    Liver Function  Tests: Recent Labs  Lab 04/01/19 0240 04/02/19 0500  AST 23 21  ALT 20 18  ALKPHOS 107 79  BILITOT 0.2* 0.1*  PROT 5.3* 4.3*  ALBUMIN 1.4* 1.1*   No results for input(s): LIPASE, AMYLASE in the last 168 hours. No results for input(s): AMMONIA in the last 168 hours. CBC: Recent Labs  Lab 04/01/19 0240 04/02/19 0500 04/03/19 0346 04/04/19 0345 04/05/19 0904  WBC 16.8* 13.9* 14.6* 14.8* 14.4*  NEUTROABS 13.6*  --  10.0* 10.5* 10.2*  HGB 9.0* 7.6* 7.6* 7.7* 8.1*  HCT 28.8* 24.6* 24.7* 24.3* 26.4*  MCV 84.2 84.5 85.8 84.7 84.6  PLT 432* 391 415* 419* 481*   Cardiac Enzymes: Recent Labs  Lab  04/01/19 0639  CKTOTAL 16*   BNP: Invalid input(s): POCBNP CBG: No results for input(s): GLUCAP in the last 168 hours. D-Dimer No results for input(s): DDIMER in the last 72 hours. Hgb A1c No results for input(s): HGBA1C in the last 72 hours. Lipid Profile No results for input(s): CHOL, HDL, LDLCALC, TRIG, CHOLHDL, LDLDIRECT in the last 72 hours. Thyroid function studies No results for input(s): TSH, T4TOTAL, T3FREE, THYROIDAB in the last 72 hours.  Invalid input(s): FREET3 Anemia work up No results for input(s): VITAMINB12, FOLATE, FERRITIN, TIBC, IRON, RETICCTPCT in the last 72 hours. Urinalysis    Component Value Date/Time   COLORURINE YELLOW 04/01/2019 0240   APPEARANCEUR CLEAR 04/01/2019 0240   LABSPEC 1.014 04/01/2019 0240   PHURINE 7.0 04/01/2019 0240   GLUCOSEU NEGATIVE 04/01/2019 0240   HGBUR NEGATIVE 04/01/2019 0240   BILIRUBINUR NEGATIVE 04/01/2019 0240   KETONESUR NEGATIVE 04/01/2019 0240   PROTEINUR NEGATIVE 04/01/2019 0240   UROBILINOGEN 1.0 03/16/2015 0835   NITRITE NEGATIVE 04/01/2019 0240   LEUKOCYTESUR SMALL (A) 04/01/2019 0240   Sepsis Labs Invalid input(s): PROCALCITONIN,  WBC,  LACTICIDVEN Microbiology Recent Results (from the past 240 hour(s))  Blood culture (routine x 2)     Status: Abnormal   Collection Time: 04/01/19  2:40 AM   Specimen: BLOOD  Result Value Ref Range Status   Specimen Description   Final    BLOOD PORTA CATH Performed at [facility], [place] [address] [place], [place] [address]    Special Requests   Final    BOTTLES DRAWN AEROBIC AND ANAEROBIC Blood Culture adequate volume Performed at [facility] [address], [place], [place] [address]    Culture  Setup Time   Final    GRAM POSITIVE COCCI ANAEROBIC BOTTLE ONLY CRITICAL RESULT CALLED TO, READ BACK BY AND VERIFIED WITH: [place] [id number] at 1150 am by cm    Culture (A)  Final    STAPHYLOCOCCUS SPECIES (COAGULASE  NEGATIVE) THE SIGNIFICANCE OF ISOLATING THIS ORGANISM FROM A SINGLE VENIPUNCTURE CANNOT BE PREDICTED WITHOUT FURTHER CLINICAL AND CULTURE CORRELATION. SUSCEPTIBILITIES AVAILABLE ONLY ON REQUEST. Performed at [facility], [place] [address], [place], [place] [address]    Report Status 04/04/2019 FINAL  Final  Urine culture     Status: Abnormal   Collection Time: 04/01/19  2:40 AM   Specimen: Urine, Random  Result Value Ref Range Status   Specimen Description   Final    URINE, RANDOM Performed at [facility] [address] [place], [place] [address]    Special Requests   Final    NONE Performed at [facility], [place] [address], [place],  [address]    Culture PSEUDOMONAS AERUGINOSA (A)  Final  Report Status 04/03/2019 FINAL  Final   Organism ID, Bacteria PSEUDOMONAS AERUGINOSA  Final      Susceptibility   Pseudomonas aeruginosa - MIC*    CEFTAZIDIME 4 SENSITIVE Sensitive     CIPROFLOXACIN >=4 RESISTANT Resistant     GENTAMICIN <=1 SENSITIVE Sensitive     IMIPENEM >=16 RESISTANT Resistant     PIP/TAZO 32 SENSITIVE Sensitive     CEFEPIME 8 SENSITIVE Sensitive     * PSEUDOMONAS AERUGINOSA  SARS CORONAVIRUS 2 (TAT 6-24 HRS) Nasopharyngeal Nasopharyngeal Swab     Status: None   Collection Time: 04/01/19  4:47 AM   Specimen: Nasopharyngeal Swab  Result Value Ref Range Status   SARS Coronavirus 2 NEGATIVE NEGATIVE Final    Comment: (NOTE) SARS-CoV-2 target nucleic acids are NOT DETECTED. The SARS-CoV-2 RNA is generally detectable in upper and lower respiratory specimens during the acute phase of infection. Negative results do not preclude SARS-CoV-2 infection, do not rule out co-infections with other pathogens, and should not be used as the sole basis for treatment or other patient management decisions. Negative results must be combined with clinical observations, patient history, and epidemiological information. The expected result  is Negative. Fact Sheet for Patients: SugarRoll.be Fact Sheet for Healthcare Providers: https://www.woods-mathews.com/ This test is not yet approved or cleared by the [place] FDA and  has been authorized for detection and/or diagnosis of SARS-CoV-2 by FDA under an Emergency Use Authorization (EUA). This EUA will remain  in effect (meaning this test can be used) for the duration of the COVID-19 declaration under Section 56 4(b)(1) of the Act, 21 U.S.C. section 360bbb-3(b)(1), unless the authorization is terminated or revoked sooner. Performed at [facility], [place] [address] [place], [place] [address]     Please note: You were cared for by a hospitalist during your hospital stay. Once you are discharged, your primary care physician will handle any further medical issues. Please note that NO REFILLS for any discharge medications will be authorized once you are discharged, as it is imperative that you return to your primary care physician (or establish a relationship with a primary care physician if you do not have one) for your post hospital discharge needs so that they can reassess your need for medications and monitor your lab values.    Time coordinating discharge: 40 minutes  SIGNED:   Shelly Coss, MD  [facility] 04/05/2019, 10:31 AM Pager [phone]  If 7PM-7AM, please contact night-coverage [email or web address] Password [email or web address]

## 2019-04-05 NOTE — Progress Notes (Signed)
Port was unable to draw back, RN contacted Lab to stick pt for labs. Lab came while RN was performing wound care where pt informed RN that he felt that lab would unable to stick him successfully. RN was unable to put in IV team consult to troubleshoot port until 0700 thus causing labs to be delayed,  .

## 2019-04-05 NOTE — Plan of Care (Signed)
Pt's BP stable and pt to go home today with PTAR after 6pm when sister is home to receive him. Will continue to assess and monitor

## 2019-04-09 ENCOUNTER — Other Ambulatory Visit: Payer: Self-pay | Admitting: Internal Medicine

## 2019-04-10 DIAGNOSIS — I9589 Other hypotension: Secondary | ICD-10-CM | POA: Diagnosis not present

## 2019-04-11 IMAGING — CT CT ABD-PELV W/ CM
2 of 6 series · 15 of 46 positions shown, 17 images · IV contrast (ISOVUE)
Comparison: MRI of the pelvis 05/13/2016. CT of the abdomen and
pelvis 03/10/2016.

CLINICAL DATA: Vomiting. Abdominal pain. Quadriplegic. Sacral
ulceration.

EXAM:
CT ABDOMEN AND PELVIS WITH CONTRAST
TECHNIQUE: Multidetector CT imaging of the abdomen and pelvis was performed
using the standard protocol following bolus administration of
intravenous contrast.
CONTRAST:  100mL [id number] IOPAMIDOL (LO3MBI-S33) INJECTION 61%

[Series 2: abd/pel with · axial · 0.91mm/px · z∈[-471,-86]mm · 12 of 91 slices shown, 14 images]
[im 7/91  soft-tissue]
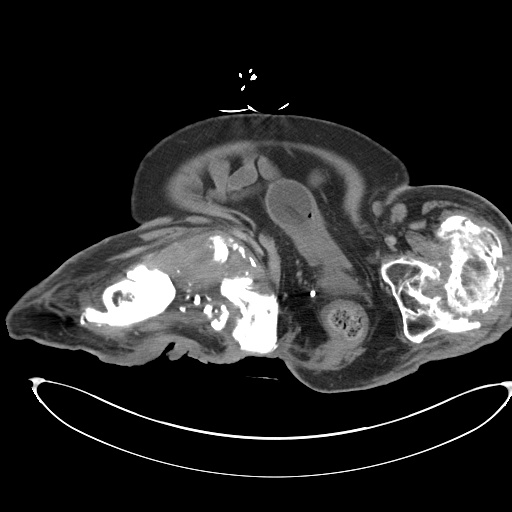
[im 7/91  bone]
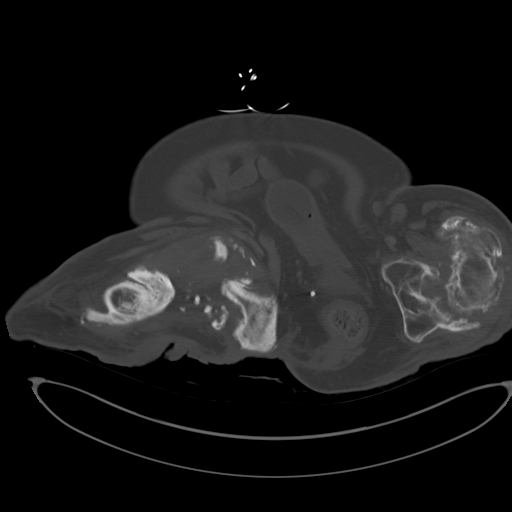
[im 14/91  soft-tissue]
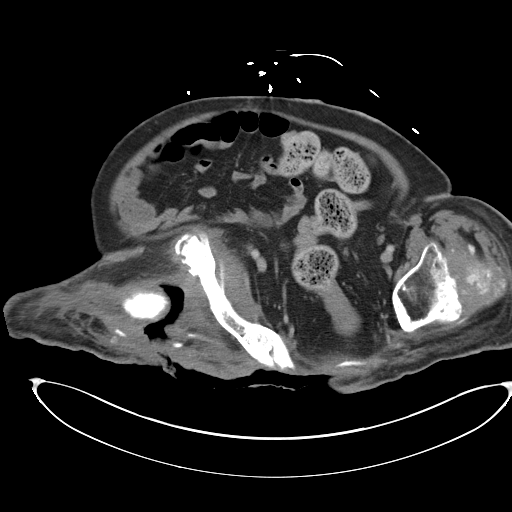
[im 21/91  soft-tissue]
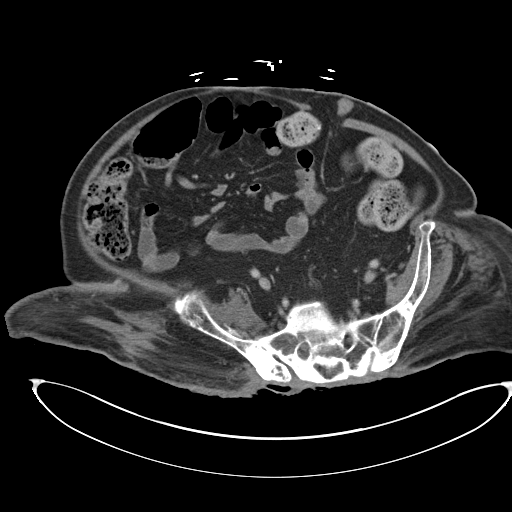
[im 28/91  soft-tissue]
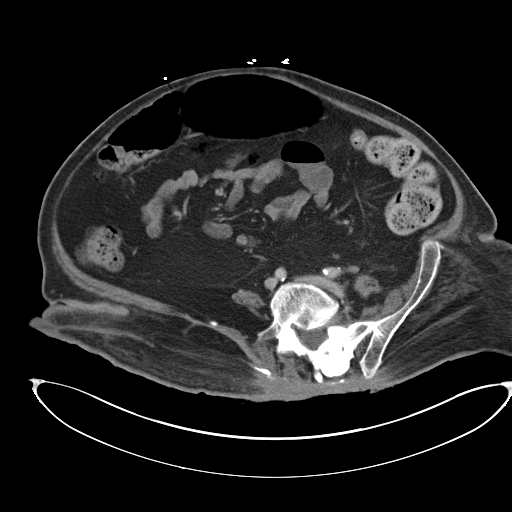
[im 35/91  soft-tissue]
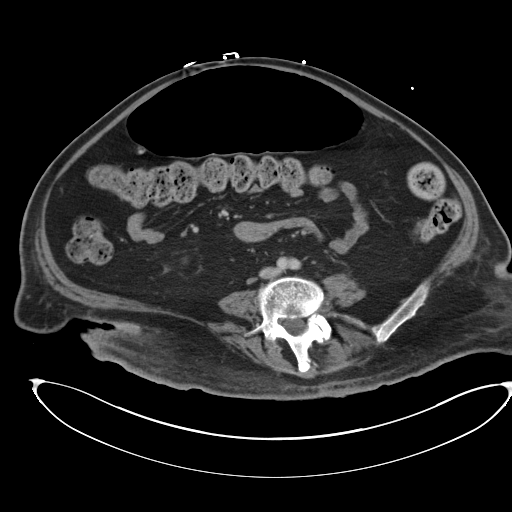
[im 42/91  soft-tissue]
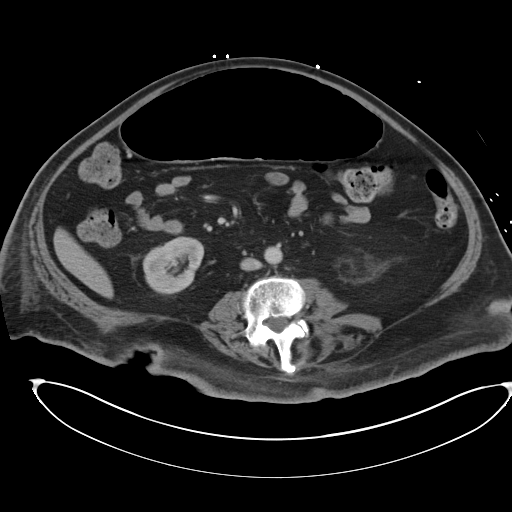
[im 49/91  soft-tissue]
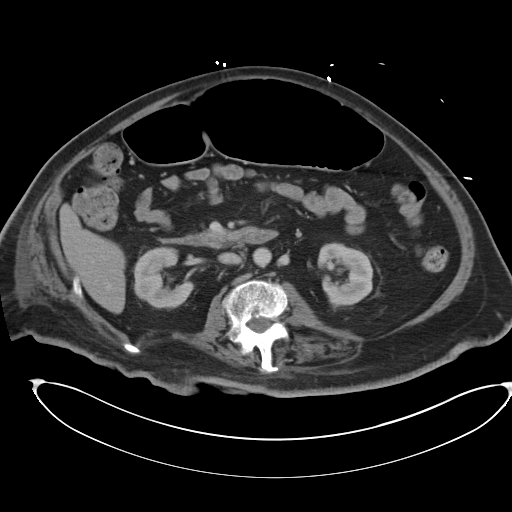
[im 56/91  soft-tissue]
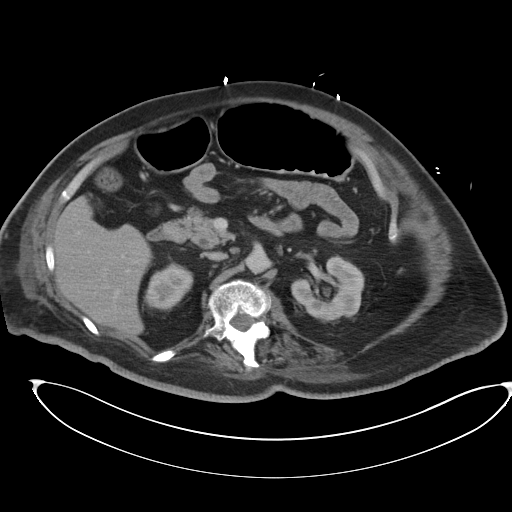
[im 63/91  soft-tissue]
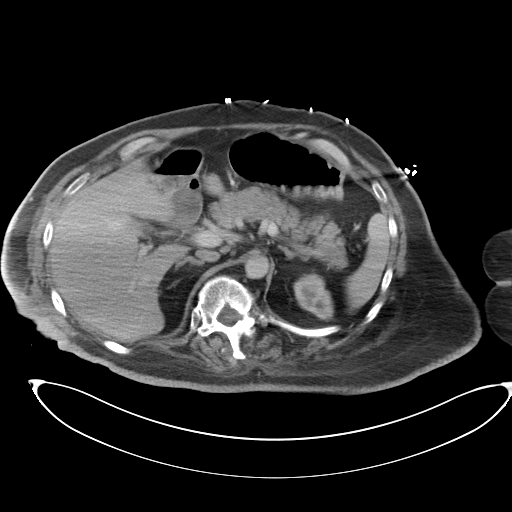
[im 63/91  bone]
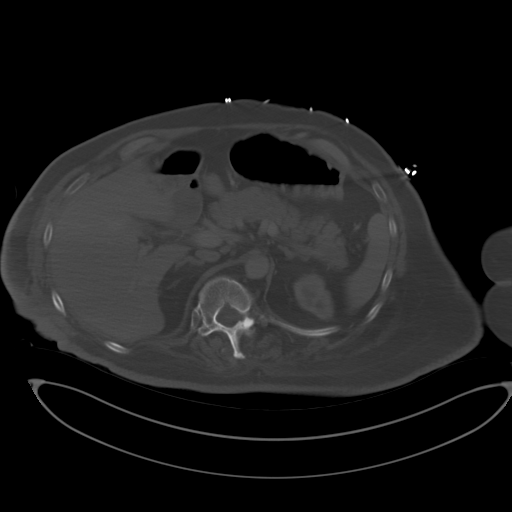
[im 70/91  soft-tissue]
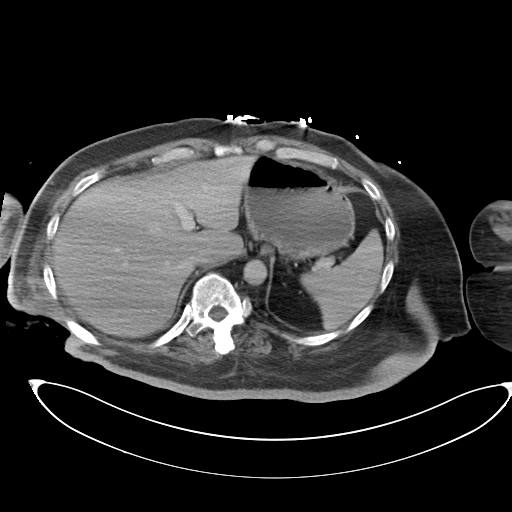
[im 77/91  soft-tissue]
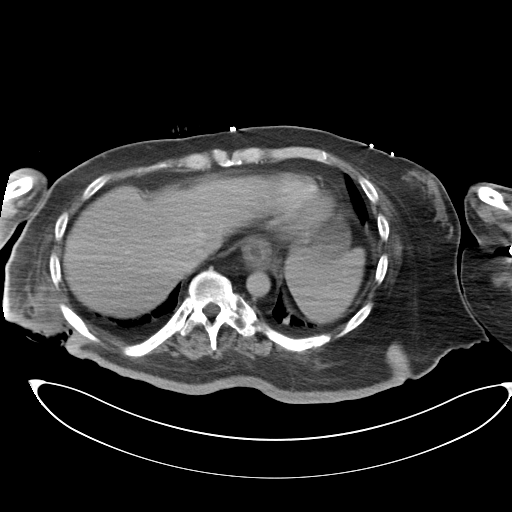
[im 84/91  soft-tissue]
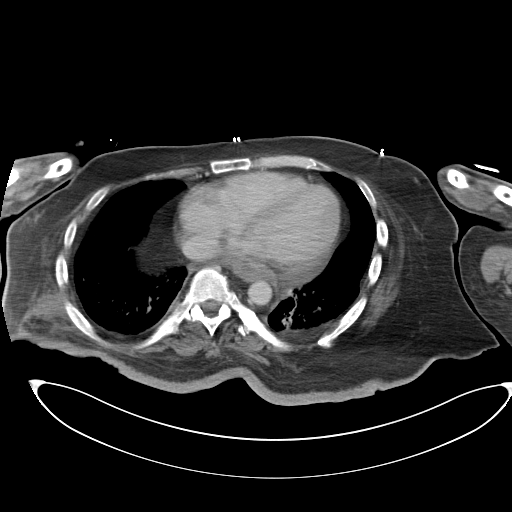

[Series 6: coronal a/|p · coronal · 0.92mm/px · 3 of 191 slices shown]
[im 64/191  soft-tissue]
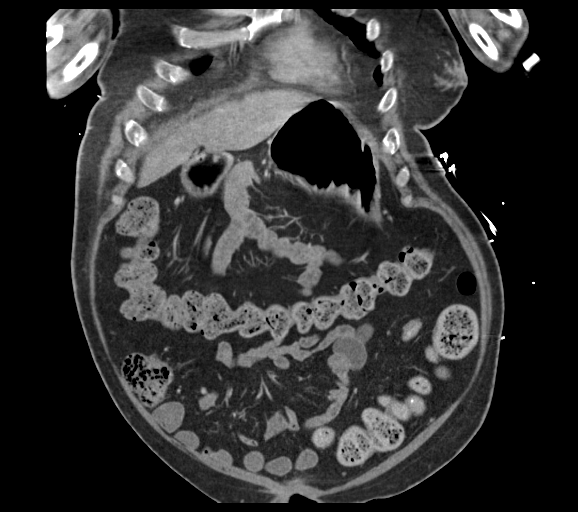
[im 85/191  soft-tissue]
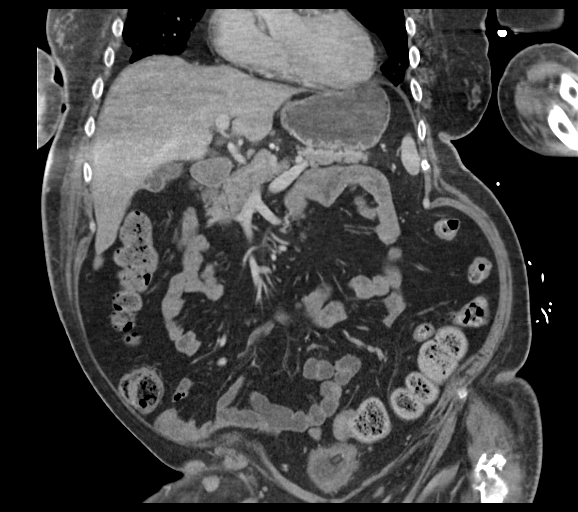
[im 106/191  soft-tissue]
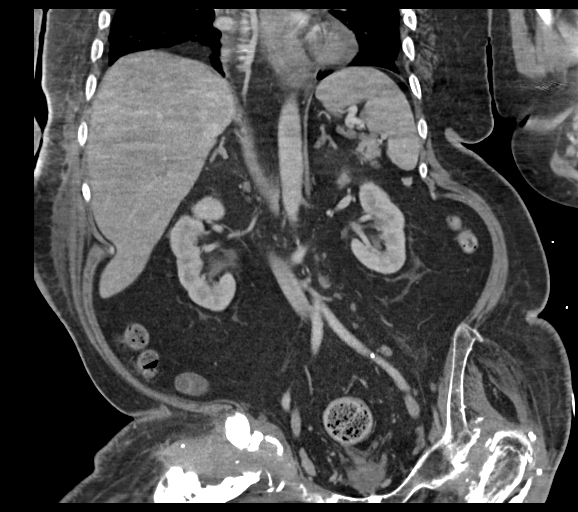

[15 of 46 positions shown; findings below may reference images not displayed]

FINDINGS: Lower chest: Right greater than left pleural effusions and basilar
airspace disease is present. This likely reflects atelectasis. No
significant consolidation is present. The heart is enlarged. The
esophagus is dilated. No significant pericardial effusion is
present.

Hepatobiliary: No focal liver abnormality is seen. No gallstones,
gallbladder wall thickening, or biliary dilatation.

Pancreas: Unremarkable. No pancreatic ductal dilatation or
surrounding inflammatory changes.

Spleen: Normal in size without focal abnormality.

Adrenals/Urinary Tract: Adrenal glands are normal bilaterally.
Kidneys and ureters are unremarkable. The urinary bladder is
collapsed around a Foley catheter.

Stomach/Bowel: The stomach is markedly dilated. No mechanical
obstruction is evident. The duodenum is unremarkable. Small bowel is
within normal limits. The appendix is within normal limits. The
ascending transverse colon is within normal limits. A left lower
quadrant colostomy is again seen. The distal bowel is unremarkable.

Vascular/Lymphatic: No significant vascular calcifications are
present. There is no significant adenopathy.

Reproductive: Unremarkable

Other: No significant free fluid or free air is present.

Musculoskeletal: Sacral ulceration is noted. There is a gas tract
extending to the dislocated right hip joint. Chronic destruction and
osteomyelitis is again noted in both hips. Sclerotic changes are
noted on the right. This is similar the prior studies.
IMPRESSION: 1. Moderate gastric distention without a discrete mechanical cause.
2. The remainder the bowel is stable and unremarkable.
3. Left lower quadrant colostomy.
4. Chronic hip dislocation this and osteomyelitis.
5. Right sacral decubitus wound with gas tracking into the
dislocated right hip joint. Active infection is not excluded.

## 2019-04-11 IMAGING — DX DG CHEST 1V PORT
2 series · 3 of 3 positions shown · non-contrast
Comparison: [DATE]

CLINICAL DATA: Nausea and vomiting for 1 day

EXAM:
PORTABLE CHEST 1 VIEW

[chest ap (1 of 2)]
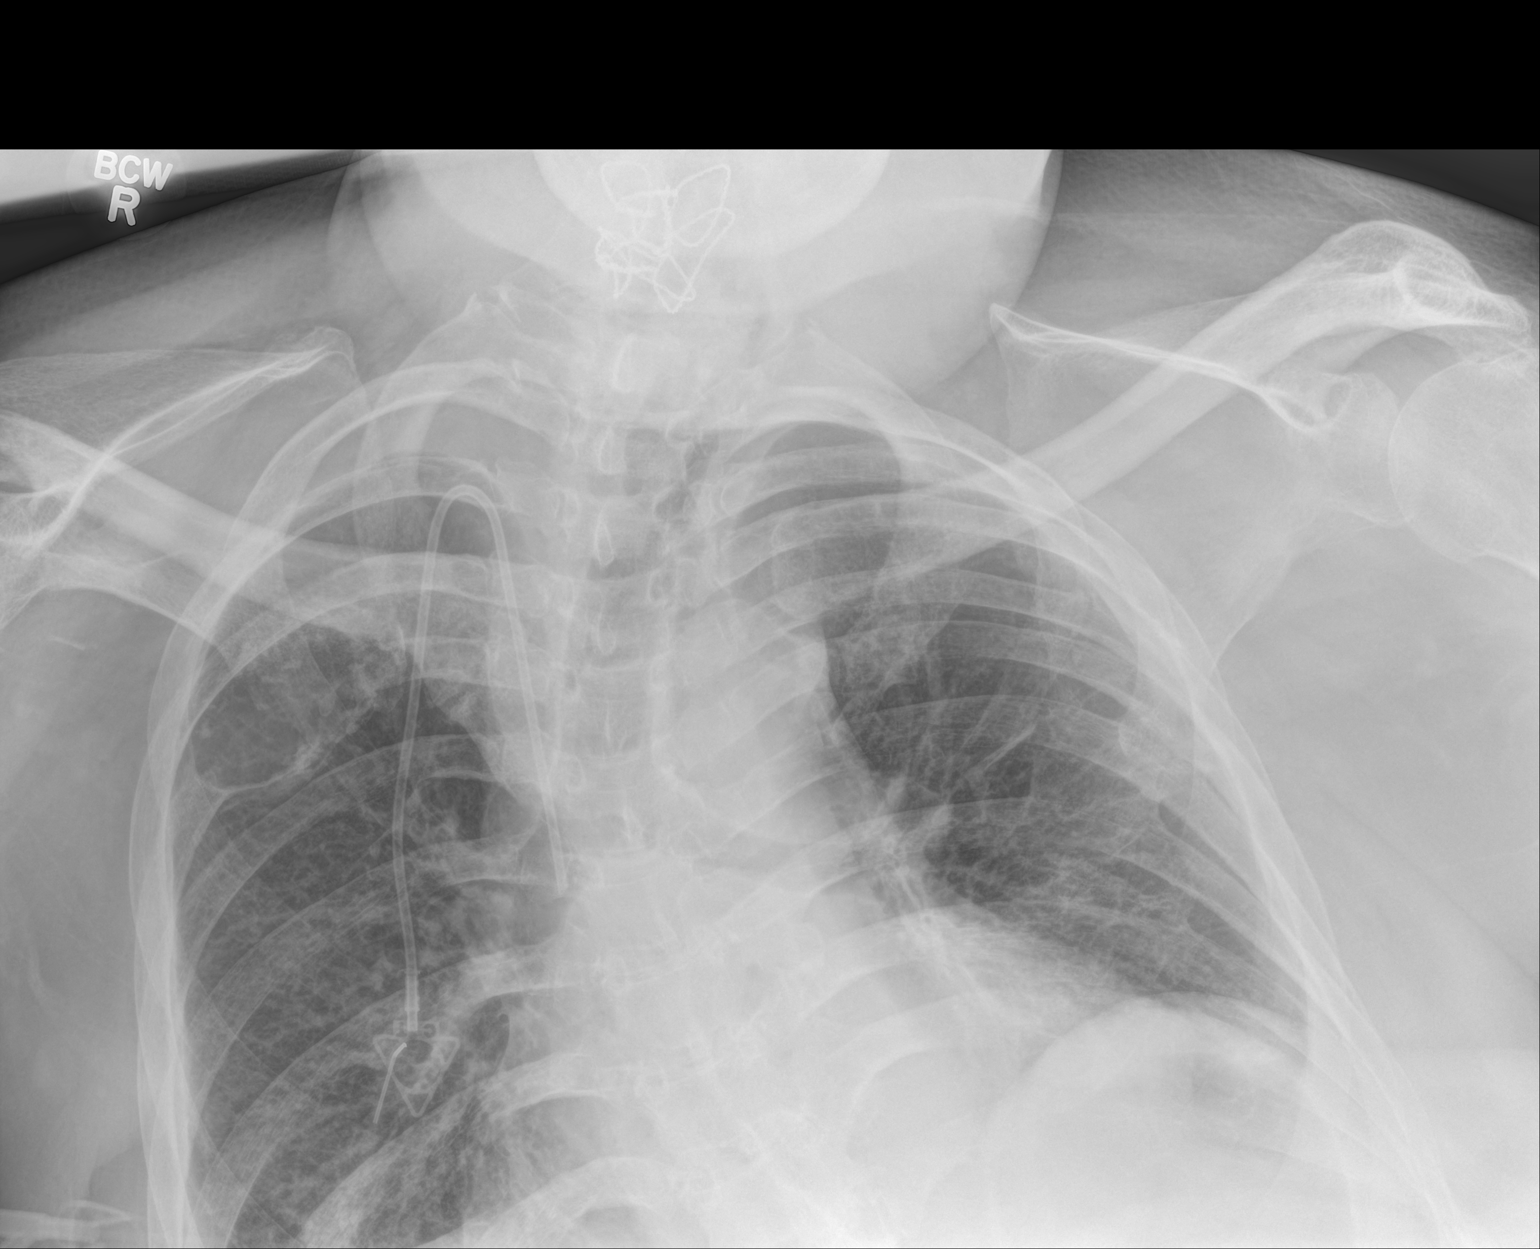

[Series 2: chest ap · 0.14mm/px · 2 of 2 slices shown (2 of 2)]
[im 1/2]
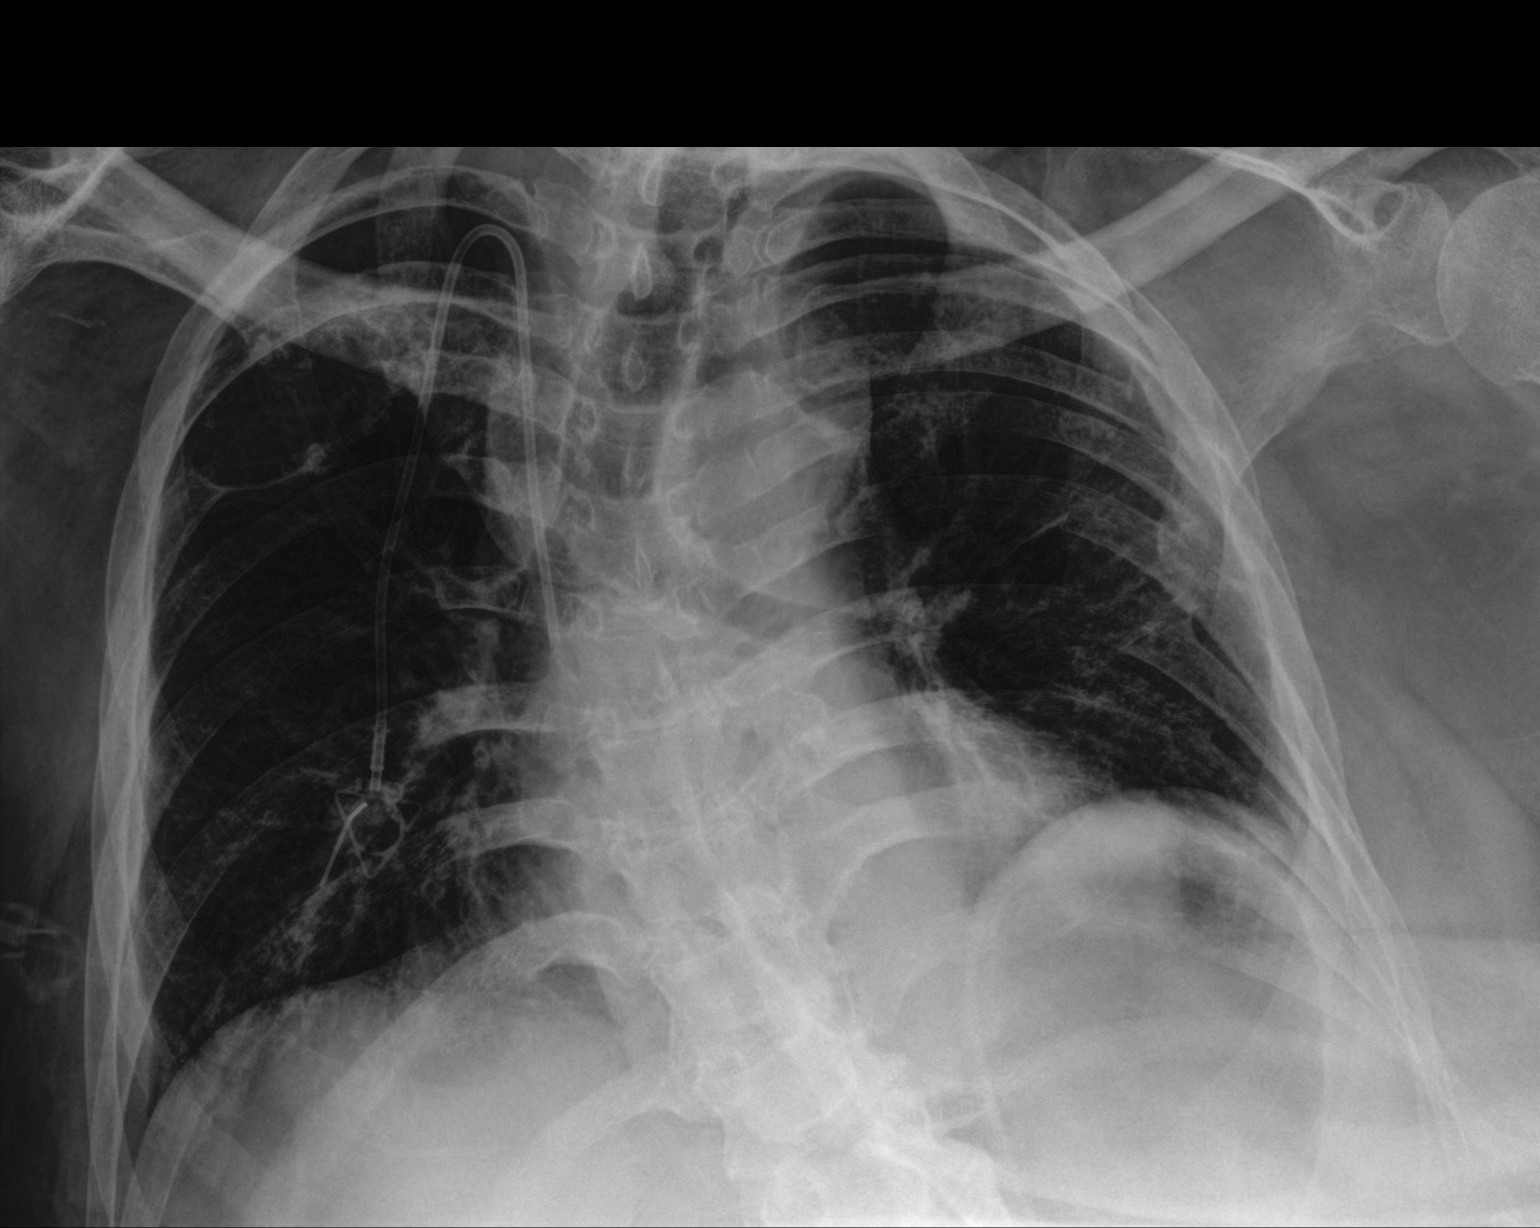
[im 2/2]
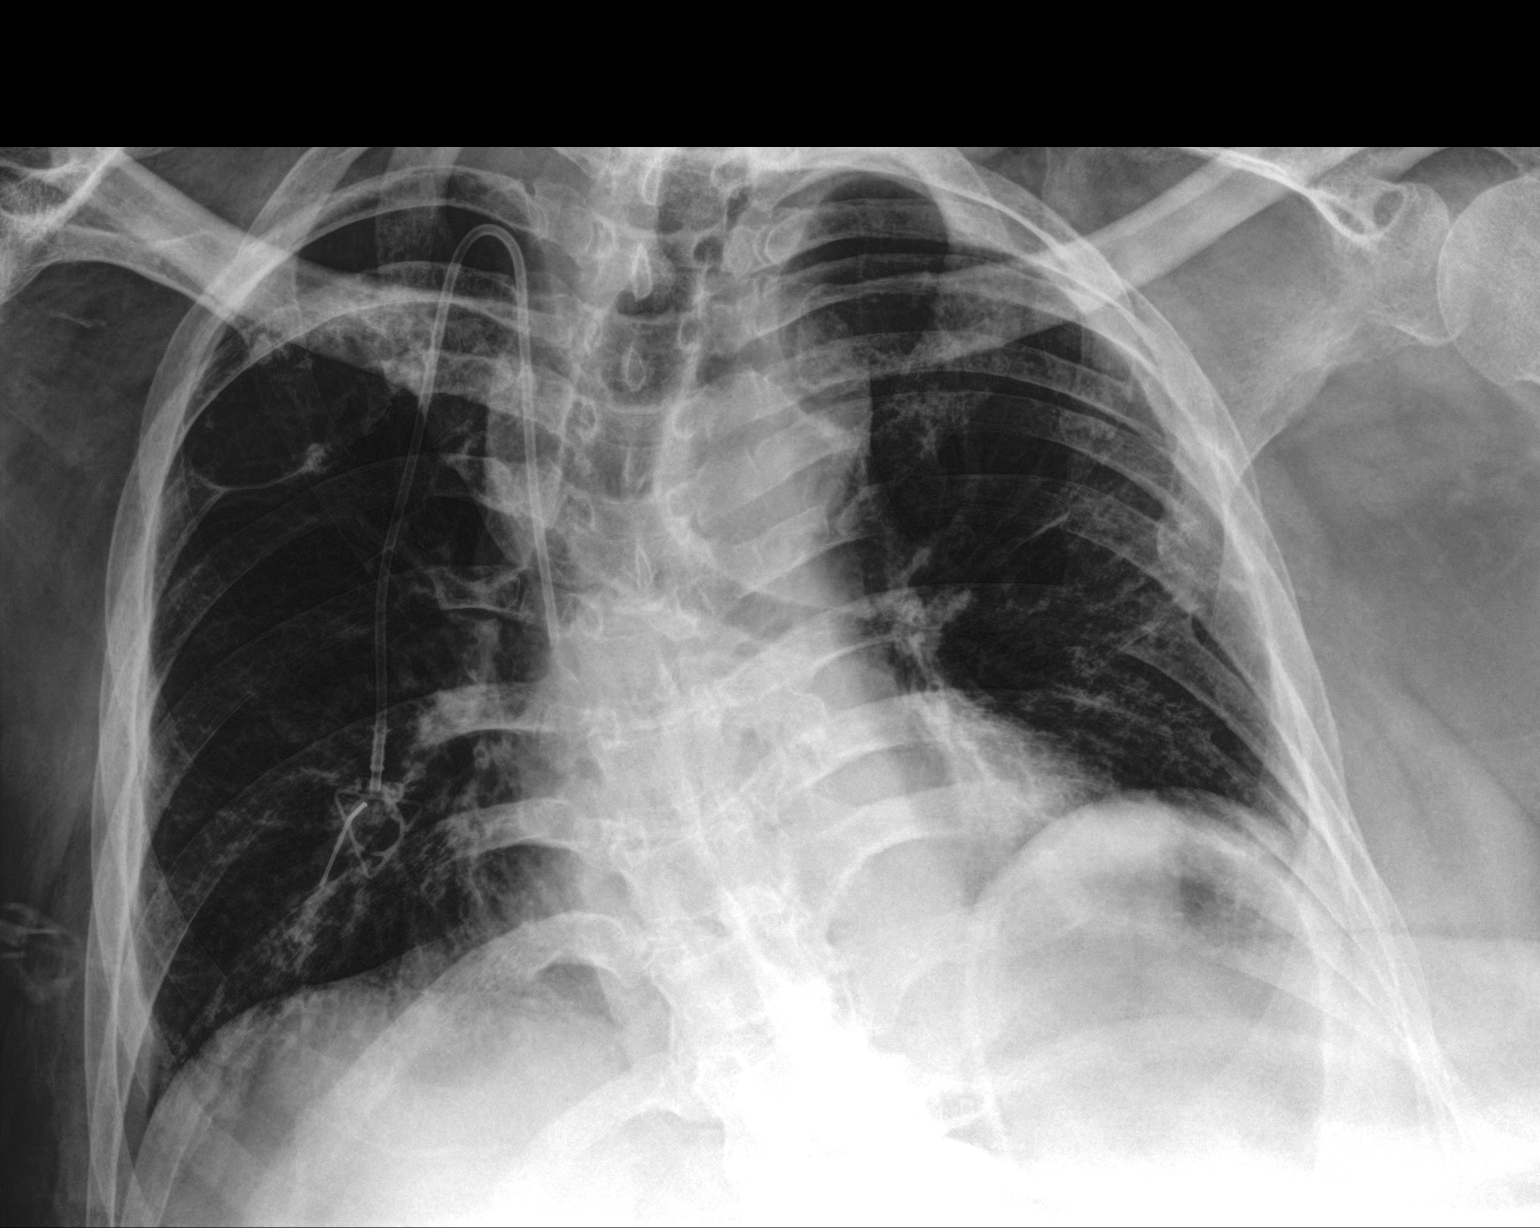

[3 of 3 positions shown; findings below may reference images not displayed]

FINDINGS: Cardiomediastinal silhouette is stable. Right IJ Port-A-Cath is
unchanged in position. Dextroscoliosis thoracic spine. Again noted
expansile mass in right fifth rib with chondroid matrix stable in
size from prior exam. No definite infiltrate or pulmonary edema.
Mild basilar atelectasis.
IMPRESSION: No infiltrate or pulmonary edema. Again noted expansile right fifth
rib mass stable in size. Bilateral basilar atelectasis. Stable right
IJ Port-A-Cath position.

## 2019-04-12 DIAGNOSIS — L8944 Pressure ulcer of contiguous site of back, buttock and hip, stage 4: Secondary | ICD-10-CM | POA: Diagnosis not present

## 2019-04-12 DIAGNOSIS — S31102A Unspecified open wound of abdominal wall, epigastric region without penetration into peritoneal cavity, initial encounter: Secondary | ICD-10-CM | POA: Diagnosis not present

## 2019-04-12 DIAGNOSIS — S31809A Unspecified open wound of unspecified buttock, initial encounter: Secondary | ICD-10-CM | POA: Diagnosis not present

## 2019-04-12 DIAGNOSIS — Z933 Colostomy status: Secondary | ICD-10-CM | POA: Diagnosis not present

## 2019-04-15 ENCOUNTER — Other Ambulatory Visit: Payer: Self-pay | Admitting: Internal Medicine

## 2019-04-17 DIAGNOSIS — Z09 Encounter for follow-up examination after completed treatment for conditions other than malignant neoplasm: Secondary | ICD-10-CM | POA: Diagnosis not present

## 2019-04-18 ENCOUNTER — Other Ambulatory Visit: Payer: Self-pay | Admitting: Internal Medicine

## 2019-04-21 ENCOUNTER — Encounter (HOSPITAL_COMMUNITY): Payer: Self-pay

## 2019-04-21 ENCOUNTER — Inpatient Hospital Stay (HOSPITAL_COMMUNITY)
Admission: EM | Admit: 2019-04-21 | Discharge: 2019-05-06 | DRG: 698 | Disposition: A | Discharge status: Home Health | Payer: Medicare Other | Attending: Internal Medicine | Admitting: Internal Medicine

## 2019-04-21 ENCOUNTER — Other Ambulatory Visit: Payer: Self-pay

## 2019-04-21 ENCOUNTER — Emergency Department (HOSPITAL_COMMUNITY): Payer: Medicare Other

## 2019-04-21 DIAGNOSIS — E43 Unspecified severe protein-calorie malnutrition: Secondary | ICD-10-CM | POA: Diagnosis present

## 2019-04-21 DIAGNOSIS — I9589 Other hypotension: Secondary | ICD-10-CM | POA: Diagnosis present

## 2019-04-21 DIAGNOSIS — G8250 Quadriplegia, unspecified: Secondary | ICD-10-CM | POA: Diagnosis not present

## 2019-04-21 DIAGNOSIS — R600 Localized edema: Secondary | ICD-10-CM | POA: Diagnosis not present

## 2019-04-21 DIAGNOSIS — Z9359 Other cystostomy status: Secondary | ICD-10-CM

## 2019-04-21 DIAGNOSIS — G4733 Obstructive sleep apnea (adult) (pediatric): Secondary | ICD-10-CM | POA: Diagnosis present

## 2019-04-21 DIAGNOSIS — D638 Anemia in other chronic diseases classified elsewhere: Secondary | ICD-10-CM | POA: Diagnosis not present

## 2019-04-21 DIAGNOSIS — L89224 Pressure ulcer of left hip, stage 4: Secondary | ICD-10-CM | POA: Diagnosis not present

## 2019-04-21 DIAGNOSIS — Z95828 Presence of other vascular implants and grafts: Secondary | ICD-10-CM | POA: Diagnosis not present

## 2019-04-21 DIAGNOSIS — U071 COVID-19: Secondary | ICD-10-CM | POA: Diagnosis not present

## 2019-04-21 DIAGNOSIS — R609 Edema, unspecified: Secondary | ICD-10-CM | POA: Diagnosis not present

## 2019-04-21 DIAGNOSIS — L89213 Pressure ulcer of right hip, stage 3: Secondary | ICD-10-CM | POA: Diagnosis not present

## 2019-04-21 DIAGNOSIS — Z933 Colostomy status: Secondary | ICD-10-CM | POA: Diagnosis not present

## 2019-04-21 DIAGNOSIS — D649 Anemia, unspecified: Secondary | ICD-10-CM | POA: Diagnosis present

## 2019-04-21 DIAGNOSIS — R7881 Bacteremia: Secondary | ICD-10-CM | POA: Diagnosis not present

## 2019-04-21 DIAGNOSIS — L89119 Pressure ulcer of right upper back, unspecified stage: Secondary | ICD-10-CM | POA: Diagnosis present

## 2019-04-21 DIAGNOSIS — E669 Obesity, unspecified: Secondary | ICD-10-CM | POA: Diagnosis present

## 2019-04-21 DIAGNOSIS — Z20828 Contact with and (suspected) exposure to other viral communicable diseases: Secondary | ICD-10-CM | POA: Diagnosis not present

## 2019-04-21 DIAGNOSIS — N390 Urinary tract infection, site not specified: Secondary | ICD-10-CM | POA: Diagnosis not present

## 2019-04-21 DIAGNOSIS — A419 Sepsis, unspecified organism: Secondary | ICD-10-CM | POA: Diagnosis not present

## 2019-04-21 DIAGNOSIS — M8660 Other chronic osteomyelitis, unspecified site: Secondary | ICD-10-CM | POA: Diagnosis not present

## 2019-04-21 DIAGNOSIS — Z6823 Body mass index (BMI) 23.0-23.9, adult: Secondary | ICD-10-CM

## 2019-04-21 DIAGNOSIS — J9811 Atelectasis: Secondary | ICD-10-CM | POA: Diagnosis not present

## 2019-04-21 DIAGNOSIS — Z8711 Personal history of peptic ulcer disease: Secondary | ICD-10-CM

## 2019-04-21 DIAGNOSIS — S12400S Unspecified displaced fracture of fifth cervical vertebra, sequela: Secondary | ICD-10-CM

## 2019-04-21 DIAGNOSIS — R000 Tachycardia, unspecified: Secondary | ICD-10-CM | POA: Diagnosis not present

## 2019-04-21 DIAGNOSIS — M4628 Osteomyelitis of vertebra, sacral and sacrococcygeal region: Secondary | ICD-10-CM | POA: Diagnosis not present

## 2019-04-21 DIAGNOSIS — Z8619 Personal history of other infectious and parasitic diseases: Secondary | ICD-10-CM

## 2019-04-21 DIAGNOSIS — M898X8 Other specified disorders of bone, other site: Secondary | ICD-10-CM | POA: Diagnosis present

## 2019-04-21 DIAGNOSIS — G8253 Quadriplegia, C5-C7 complete: Secondary | ICD-10-CM | POA: Diagnosis not present

## 2019-04-21 DIAGNOSIS — Z9989 Dependence on other enabling machines and devices: Secondary | ICD-10-CM

## 2019-04-21 DIAGNOSIS — Y732 Prosthetic and other implants, materials and accessory gastroenterology and urology devices associated with adverse incidents: Secondary | ICD-10-CM | POA: Diagnosis present

## 2019-04-21 DIAGNOSIS — E041 Nontoxic single thyroid nodule: Secondary | ICD-10-CM | POA: Diagnosis present

## 2019-04-21 DIAGNOSIS — M2450 Contracture, unspecified joint: Secondary | ICD-10-CM | POA: Diagnosis present

## 2019-04-21 DIAGNOSIS — M869 Osteomyelitis, unspecified: Secondary | ICD-10-CM

## 2019-04-21 DIAGNOSIS — R0689 Other abnormalities of breathing: Secondary | ICD-10-CM | POA: Diagnosis not present

## 2019-04-21 DIAGNOSIS — M7989 Other specified soft tissue disorders: Secondary | ICD-10-CM

## 2019-04-21 DIAGNOSIS — N319 Neuromuscular dysfunction of bladder, unspecified: Secondary | ICD-10-CM | POA: Diagnosis present

## 2019-04-21 DIAGNOSIS — I739 Peripheral vascular disease, unspecified: Secondary | ICD-10-CM | POA: Diagnosis not present

## 2019-04-21 DIAGNOSIS — R112 Nausea with vomiting, unspecified: Secondary | ICD-10-CM

## 2019-04-21 DIAGNOSIS — Z888 Allergy status to other drugs, medicaments and biological substances status: Secondary | ICD-10-CM

## 2019-04-21 DIAGNOSIS — T07XXXA Unspecified multiple injuries, initial encounter: Secondary | ICD-10-CM | POA: Diagnosis present

## 2019-04-21 DIAGNOSIS — L89154 Pressure ulcer of sacral region, stage 4: Secondary | ICD-10-CM | POA: Diagnosis present

## 2019-04-21 DIAGNOSIS — S14115S Complete lesion at C5 level of cervical spinal cord, sequela: Secondary | ICD-10-CM | POA: Diagnosis not present

## 2019-04-21 DIAGNOSIS — T83518A Infection and inflammatory reaction due to other urinary catheter, initial encounter: Principal | ICD-10-CM | POA: Diagnosis present

## 2019-04-21 DIAGNOSIS — J90 Pleural effusion, not elsewhere classified: Secondary | ICD-10-CM | POA: Diagnosis not present

## 2019-04-21 DIAGNOSIS — Z452 Encounter for adjustment and management of vascular access device: Secondary | ICD-10-CM | POA: Diagnosis not present

## 2019-04-21 DIAGNOSIS — I10 Essential (primary) hypertension: Secondary | ICD-10-CM | POA: Diagnosis present

## 2019-04-21 DIAGNOSIS — I8290 Acute embolism and thrombosis of unspecified vein: Secondary | ICD-10-CM

## 2019-04-21 DIAGNOSIS — Z8701 Personal history of pneumonia (recurrent): Secondary | ICD-10-CM

## 2019-04-21 DIAGNOSIS — Z833 Family history of diabetes mellitus: Secondary | ICD-10-CM

## 2019-04-21 DIAGNOSIS — Z7401 Bed confinement status: Secondary | ICD-10-CM

## 2019-04-21 DIAGNOSIS — Z87440 Personal history of urinary (tract) infections: Secondary | ICD-10-CM

## 2019-04-21 DIAGNOSIS — Z885 Allergy status to narcotic agent status: Secondary | ICD-10-CM

## 2019-04-21 DIAGNOSIS — Z79899 Other long term (current) drug therapy: Secondary | ICD-10-CM

## 2019-04-21 DIAGNOSIS — I959 Hypotension, unspecified: Secondary | ICD-10-CM | POA: Diagnosis not present

## 2019-04-21 DIAGNOSIS — L89159 Pressure ulcer of sacral region, unspecified stage: Secondary | ICD-10-CM | POA: Diagnosis present

## 2019-04-21 DIAGNOSIS — Z87828 Personal history of other (healed) physical injury and trauma: Secondary | ICD-10-CM

## 2019-04-21 DIAGNOSIS — J984 Other disorders of lung: Secondary | ICD-10-CM | POA: Diagnosis not present

## 2019-04-21 DIAGNOSIS — E876 Hypokalemia: Secondary | ICD-10-CM | POA: Diagnosis present

## 2019-04-21 DIAGNOSIS — I950 Idiopathic hypotension: Secondary | ICD-10-CM | POA: Diagnosis not present

## 2019-04-21 DIAGNOSIS — Z9049 Acquired absence of other specified parts of digestive tract: Secondary | ICD-10-CM

## 2019-04-21 DIAGNOSIS — K219 Gastro-esophageal reflux disease without esophagitis: Secondary | ICD-10-CM | POA: Diagnosis present

## 2019-04-21 DIAGNOSIS — E860 Dehydration: Secondary | ICD-10-CM

## 2019-04-21 DIAGNOSIS — Z881 Allergy status to other antibiotic agents status: Secondary | ICD-10-CM

## 2019-04-21 LAB — COMPREHENSIVE METABOLIC PANEL
ALT: 13 U/L (ref 0–44)
AST: 11 U/L — ABNORMAL LOW (ref 15–41)
Albumin: 2 g/dL — ABNORMAL LOW (ref 3.5–5.0)
Alkaline Phosphatase: 73 U/L (ref 38–126)
Anion gap: 10 (ref 5–15)
BUN: 25 mg/dL — ABNORMAL HIGH (ref 6–20)
CO2: 22 mmol/L (ref 22–32)
Calcium: 8.3 mg/dL — ABNORMAL LOW (ref 8.9–10.3)
Chloride: 109 mmol/L (ref 98–111)
Creatinine, Ser: 0.54 mg/dL — ABNORMAL LOW (ref 0.61–1.24)
GFR calc Af Amer: >60 mL/min (ref >60)
GFR calc non Af Amer: >60 mL/min (ref >60)
Glucose, Bld: 154 mg/dL — ABNORMAL HIGH (ref 70–99)
Potassium: 2.9 mmol/L — ABNORMAL LOW (ref 3.5–5.1)
Sodium: 141 mmol/L (ref 135–145)
Total Bilirubin: 0.4 mg/dL (ref 0.3–1.2)
Total Protein: 6.1 g/dL — ABNORMAL LOW (ref 6.5–8.1)

## 2019-04-21 LAB — CBC WITH DIFFERENTIAL/PLATELET
Abs Immature Granulocytes: 0.1 10*3/uL — ABNORMAL HIGH (ref 0.00–0.07)
Basophils Absolute: 0 10*3/uL (ref 0.0–0.1)
Basophils Relative: 0 %
Eosinophils Absolute: 0 10*3/uL (ref 0.0–0.5)
Eosinophils Relative: 0 %
HCT: 28.2 % — ABNORMAL LOW (ref 39.0–52.0)
Hemoglobin: 8.5 g/dL — ABNORMAL LOW (ref 13.0–17.0)
Immature Granulocytes: 1 %
Lymphocytes Relative: 7 %
Lymphs Abs: 1.1 10*3/uL (ref 0.7–4.0)
MCH: 25.5 pg — ABNORMAL LOW (ref 26.0–34.0)
MCHC: 30.1 g/dL (ref 30.0–36.0)
MCV: 84.7 fL (ref 80.0–100.0)
Monocytes Absolute: 2.2 10*3/uL — ABNORMAL HIGH (ref 0.1–1.0)
Monocytes Relative: 14 %
Neutro Abs: 11.9 10*3/uL — ABNORMAL HIGH (ref 1.7–7.7)
Neutrophils Relative %: 78 %
Platelets: 464 10*3/uL — ABNORMAL HIGH (ref 150–400)
RBC: 3.33 MIL/uL — ABNORMAL LOW (ref 4.22–5.81)
RDW: 16.4 % — ABNORMAL HIGH (ref 11.5–15.5)
WBC: 15.3 10*3/uL — ABNORMAL HIGH (ref 4.0–10.5)
nRBC: 0 % (ref 0.0–0.2)

## 2019-04-21 LAB — CBG MONITORING, ED: Glucose-Capillary: 145 mg/dL — ABNORMAL HIGH (ref 70–99)

## 2019-04-21 LAB — APTT: aPTT: 37 seconds — ABNORMAL HIGH (ref 24–36)

## 2019-04-21 LAB — URINALYSIS, ROUTINE W REFLEX MICROSCOPIC
Glucose, UA: NEGATIVE mg/dL
Hgb urine dipstick: NEGATIVE
Ketones, ur: 5 mg/dL — AB
Nitrite: NEGATIVE
Protein, ur: >=300 mg/dL — AB
Specific Gravity, Urine: 1.019 (ref 1.005–1.030)
pH: 8 (ref 5.0–8.0)

## 2019-04-21 LAB — PROTIME-INR
INR: 1.1 (ref 0.8–1.2)
Prothrombin Time: 13.9 seconds (ref 11.4–15.2)

## 2019-04-21 LAB — LACTIC ACID, PLASMA: Lactic Acid, Venous: 1 mmol/L (ref 0.5–1.9)

## 2019-04-21 LAB — SARS CORONAVIRUS 2 (TAT 6-24 HRS): SARS Coronavirus 2: NEGATIVE

## 2019-04-21 MED ORDER — ENOXAPARIN SODIUM 40 MG/0.4ML ~~LOC~~ SOLN
40.0000 mg | SUBCUTANEOUS | Status: AC
  Administered 2019-04-21 – 2019-04-25 (×5): 40 mg via SUBCUTANEOUS
  Filled 2019-04-21 (×5): qty 0.4

## 2019-04-21 MED ORDER — PIPERACILLIN-TAZOBACTAM 3.375 G IVPB 30 MIN
3.3750 g | Freq: Once | INTRAVENOUS | Status: AC
  Administered 2019-04-21: 3.375 g via INTRAVENOUS
  Filled 2019-04-21: qty 50

## 2019-04-21 MED ORDER — ONDANSETRON 4 MG PO TBDP
8.0000 mg | ORAL_TABLET | Freq: Three times a day (TID) | ORAL | Status: DC | PRN
  Administered 2019-04-23 – 2019-04-28 (×2): 8 mg via ORAL
  Filled 2019-04-21 (×2): qty 2

## 2019-04-21 MED ORDER — PROMETHAZINE HCL 25 MG/ML IJ SOLN
25.0000 mg | Freq: Once | INTRAMUSCULAR | Status: AC
  Administered 2019-04-21: 25 mg via INTRAVENOUS
  Filled 2019-04-21: qty 1

## 2019-04-21 MED ORDER — LACTATED RINGERS IV BOLUS (SEPSIS)
1000.0000 mL | Freq: Once | INTRAVENOUS | Status: AC
  Administered 2019-04-21: 1000 mL via INTRAVENOUS

## 2019-04-21 MED ORDER — FESOTERODINE FUMARATE ER 8 MG PO TB24
8.0000 mg | ORAL_TABLET | Freq: Every morning | ORAL | Status: DC
  Administered 2019-04-21 – 2019-05-06 (×16): 8 mg via ORAL
  Filled 2019-04-21 (×17): qty 1

## 2019-04-21 MED ORDER — SODIUM CHLORIDE 0.9% FLUSH: 10.0000 mL | INTRAVENOUS | Status: DC | PRN

## 2019-04-21 MED ORDER — SENNOSIDES-DOCUSATE SODIUM 8.6-50 MG PO TABS: 2.0000 | ORAL_TABLET | Freq: Every evening | ORAL | Status: DC | PRN

## 2019-04-21 MED ORDER — GUAIFENESIN 100 MG/5ML PO SOLN: 5.0000 mL | ORAL | Status: DC | PRN

## 2019-04-21 MED ORDER — MIDODRINE HCL 5 MG PO TABS
10.0000 mg | ORAL_TABLET | Freq: Three times a day (TID) | ORAL | Status: DC
  Administered 2019-04-21 – 2019-05-06 (×43): 10 mg via ORAL
  Filled 2019-04-21 (×45): qty 2

## 2019-04-21 MED ORDER — RISAQUAD PO CAPS
1.0000 | ORAL_CAPSULE | Freq: Every day | ORAL | Status: DC
  Administered 2019-04-21 – 2019-05-06 (×16): 1 via ORAL
  Filled 2019-04-21 (×16): qty 1

## 2019-04-21 MED ORDER — CHLORHEXIDINE GLUCONATE CLOTH 2 % EX PADS
6.0000 | MEDICATED_PAD | Freq: Every day | CUTANEOUS | Status: DC
  Administered 2019-04-21 – 2019-05-06 (×15): 6 via TOPICAL

## 2019-04-21 MED ORDER — SUCRALFATE 1 G PO TABS
1.0000 g | ORAL_TABLET | Freq: Three times a day (TID) | ORAL | Status: DC
  Administered 2019-04-21 – 2019-05-06 (×59): 1 g via ORAL
  Filled 2019-04-21 (×61): qty 1

## 2019-04-21 MED ORDER — METOCLOPRAMIDE HCL 10 MG PO TABS
10.0000 mg | ORAL_TABLET | Freq: Three times a day (TID) | ORAL | Status: DC
  Administered 2019-04-21 – 2019-05-06 (×44): 10 mg via ORAL
  Filled 2019-04-21 (×46): qty 1

## 2019-04-21 MED ORDER — SODIUM CHLORIDE 0.9% FLUSH
10.0000 mL | Freq: Two times a day (BID) | INTRAVENOUS | Status: DC
  Administered 2019-04-21 – 2019-05-05 (×19): 10 mL

## 2019-04-21 MED ORDER — LACTATED RINGERS IV BOLUS
1000.0000 mL | Freq: Once | INTRAVENOUS | Status: AC
  Administered 2019-04-21: 1000 mL via INTRAVENOUS

## 2019-04-21 MED ORDER — POTASSIUM CHLORIDE CRYS ER 20 MEQ PO TBCR
40.0000 meq | EXTENDED_RELEASE_TABLET | Freq: Once | ORAL | Status: AC
  Administered 2019-04-21: 40 meq via ORAL
  Filled 2019-04-21: qty 2

## 2019-04-21 MED ORDER — FERROUS SULFATE 325 (65 FE) MG PO TABS
325.0000 mg | ORAL_TABLET | Freq: Every day | ORAL | Status: DC
  Administered 2019-04-22 – 2019-05-06 (×15): 325 mg via ORAL
  Filled 2019-04-21 (×15): qty 1

## 2019-04-21 MED ORDER — VITAMIN C 500 MG PO TABS
500.0000 mg | ORAL_TABLET | Freq: Every day | ORAL | Status: DC
  Administered 2019-04-21 – 2019-05-06 (×15): 500 mg via ORAL
  Filled 2019-04-21 (×15): qty 1

## 2019-04-21 MED ORDER — ZINC SULFATE 220 (50 ZN) MG PO CAPS
220.0000 mg | ORAL_CAPSULE | Freq: Every day | ORAL | Status: DC
  Administered 2019-04-21 – 2019-05-06 (×15): 220 mg via ORAL
  Filled 2019-04-21 (×16): qty 1

## 2019-04-21 MED ORDER — LEVALBUTEROL HCL 0.63 MG/3ML IN NEBU: 0.6300 mg | INHALATION_SOLUTION | RESPIRATORY_TRACT | Status: DC | PRN

## 2019-04-21 MED ORDER — BACLOFEN 10 MG PO TABS
20.0000 mg | ORAL_TABLET | Freq: Two times a day (BID) | ORAL | Status: DC
  Administered 2019-04-21 – 2019-05-06 (×32): 20 mg via ORAL
  Filled 2019-04-21 (×32): qty 2

## 2019-04-21 MED ORDER — MAGNESIUM OXIDE 400 (241.3 MG) MG PO TABS
400.0000 mg | ORAL_TABLET | Freq: Every day | ORAL | Status: DC
  Administered 2019-04-22 – 2019-05-06 (×15): 400 mg via ORAL
  Filled 2019-04-21 (×17): qty 1

## 2019-04-21 MED ORDER — PANTOPRAZOLE SODIUM 40 MG PO TBEC
40.0000 mg | DELAYED_RELEASE_TABLET | Freq: Two times a day (BID) | ORAL | Status: DC
  Administered 2019-04-21 – 2019-05-06 (×32): 40 mg via ORAL
  Filled 2019-04-21 (×32): qty 1

## 2019-04-21 MED ORDER — SODIUM CHLORIDE 0.9 % IV BOLUS
1000.0000 mL | Freq: Once | INTRAVENOUS | Status: AC
  Administered 2019-04-21: 1000 mL via INTRAVENOUS

## 2019-04-21 MED ORDER — DEXTROSE 5 % IV SOLN
2.5000 g | Freq: Three times a day (TID) | INTRAVENOUS | Status: DC
  Administered 2019-04-21 – 2019-04-23 (×8): 2.5 g via INTRAVENOUS
  Filled 2019-04-21 (×10): qty 12

## 2019-04-21 MED ORDER — ACETAMINOPHEN 325 MG PO TABS
650.0000 mg | ORAL_TABLET | Freq: Four times a day (QID) | ORAL | Status: DC | PRN
  Administered 2019-04-23 – 2019-05-04 (×3): 650 mg via ORAL
  Filled 2019-04-21 (×3): qty 2

## 2019-04-21 MED ORDER — ONDANSETRON HCL 4 MG/2ML IJ SOLN
4.0000 mg | Freq: Once | INTRAMUSCULAR | Status: AC
  Administered 2019-04-21: 4 mg via INTRAVENOUS
  Filled 2019-04-21: qty 2

## 2019-04-21 MED ORDER — LINEZOLID 600 MG/300ML IV SOLN
600.0000 mg | Freq: Once | INTRAVENOUS | Status: DC
  Filled 2019-04-21: qty 300

## 2019-04-21 MED ORDER — SODIUM CHLORIDE 0.9 % IV SOLN
500.0000 mg | Freq: Every day | INTRAVENOUS | Status: DC
  Administered 2019-04-21 – 2019-04-24 (×4): 500 mg via INTRAVENOUS
  Filled 2019-04-21 (×4): qty 10

## 2019-04-21 MED ORDER — LACTATED RINGERS IV SOLN
INTRAVENOUS | Status: DC
  Administered 2019-04-21 – 2019-04-29 (×13): via INTRAVENOUS

## 2019-04-21 MED ORDER — LACTATED RINGERS IV BOLUS (SEPSIS)
500.0000 mL | Freq: Once | INTRAVENOUS | Status: AC
  Administered 2019-04-21: 500 mL via INTRAVENOUS
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
## 2019-04-21 NOTE — ED Notes (Signed)
Report given to [employer] for 2W, 1231

## 2019-04-21 NOTE — ED Triage Notes (Signed)
Patient coming from home with complaints of a possible UTI. Patient has not had his suprapubic catheter changed since the 16th of November. Patient was supposed to have this changed a week ago but was not able to do so. Patient is complaining of nausea and abdominal pain.   Vitals with EMS: RR 30 SpO2 100% on RA HR 120 BP 74/38 (53) CBG 191

## 2019-04-21 NOTE — ED Notes (Signed)
Patient has been repositioned with pillows and Chuck pad have been changed and replaced.
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
## 2019-04-21 NOTE — Consult Note (Signed)
[facility] Nurse ostomy consult note Stoma type/location: LLQ colostomy  Stomal assessment/size: Approximately 1 and 5/8 inches, slightly raised. Ostomy pouching: 2pc. 2 and 3/4 inches with skin barrier ring Education provided: None Enrolled patient in [facility] program: No. Patient with long-standing ostomy and previous establishment with a supplier.  Patient well known to our department from previous admissions. Orders provided for staff regarding ostomy. Supplies ordered today for bedside.  [facility] nursing team will see tomorrow for wound assessment.   Thanks, Maudie Flakes, MSN, RN, [place], Arther Abbott  Pager# [phone]

## 2019-04-21 NOTE — ED Notes (Signed)
Patient dry heaving at this time and complaining of nausea. EDP made aware.

## 2019-04-21 NOTE — ED Notes (Signed)
Unable to obtain blood cultures due to patient being a difficult stick. EDP made aware.

## 2019-04-21 NOTE — ED Notes (Signed)
Nurse Secretary for ICU will find out when RN can call report and who will be receiving report due to IP note not populating with neccessary information to call report.

## 2019-04-21 NOTE — Progress Notes (Signed)
Notified provider of need to order antibiotics. MD would like to wait until urine sample comes back. Lactic acid is 1.0. Will continue to follow.

## 2019-04-21 NOTE — Progress Notes (Signed)
Suprapubic catheter exchanged per MD orders with assistance from [place], [place]. 22 Fr catheter inserted with urine return. Pt tolerated well.

## 2019-04-21 NOTE — Progress Notes (Addendum)
Patient arrived from ED, Initial Assessment completed, see flowsheets for full body review.   Wound consult placed due to number and size of wounds prior to admission. All wounds visualized by this RN, cleansed and covered upon arrival to floor. Dressing in place are new, clean, dry and intact.   Awaiting wound consult for further instruction, scheduled for 12/7 per [place] note.   Wounds noted: #1 right scapula unstageable  #2 right flank TOP, stage 3 #3 right flank lower unstageable  #4 right flank/rib cage-unstageable/tunneled  #5 right TOP hip stage 2 #6 right LOWER hip unstageable  #7 sacrum/groin/buttocks unstageable/tunnled  #8 right abdomen unstageable  #9 right inner thigh unstageable  #10 top of right toes, unstageable #11bottom left greater toe pad unstageable  #12 right heal unstageable  #13 left hip stage 3

## 2019-04-21 NOTE — Progress Notes (Signed)
Pharmacy Antibiotic Note  [name] [name] is a 53 y.o. male admitted on 04/21/2019 with sepsis.  Pharmacy has been consulted for daptomycin dosing. Sepsis due to UTI caused by presence of suprapubic catheter  Plan: daptomycin 500 mg IV q24 (~ 6.4 mg/kg rounded to nearest vial size) CK weekly on Mondays Acycaz (ceftazidime-avibactam) 2.5 gm IV q8 per ID F/u renal fxn, CK, WBC, temp, culture data  Height: 6' (182.9 cm) Weight: 171 lb 4.8 oz (77.7 kg) IBW/kg (Calculated) : 77.6  Temp (24hrs), Avg:99.2 F (37.3 C), Min:99.2 F (37.3 C), Max:99.2 F (37.3 C)  Recent Labs  Lab 04/21/19 0346  WBC 15.3*  CREATININE 0.54*  LATICACIDVEN 1.0    Estimated Creatinine Clearance: 118.6 mL/min (A) (by C-G formula based on SCr of 0.54 mg/dL (L)).    Allergies  Allergen Reactions  . Ferumoxytol Anaphylaxis and Other (See Comments)    (Feraheme) = "SYNCOPE; patient tolerated Venofer 10/01/18 s rxn"   . Oxybutynin Other (See Comments)    Hallucinations    . Vancomycin Other (See Comments)    ARF 05-2016 -- affects kidneys     Antimicrobials this admission: 12/6 Zosyn x 1 dose 12/6 dapto>> 12/6 avycaz>> Dose adjustments this admission:  Microbiology results: 12/6 covid neg 12/6 UCx: sent PTA: 11/16 BCx PAC: CNS. F 11/16 UCx: pseudomonas R cipro/imipenem 03/19/19 BCx: acinetobacter calcocetcus/baumannii: R CTX, Cipro, imipenem. Pip/tazo 10/14/18 Ucx: VRE 12/30/18 wound Cx: ESBL E Coli   Thank you for allowing pharmacy to be a part of this patient's care.  Eudelia Bunch, Pharm.D [phone] 04/21/2019 9:33 AM

## 2019-04-22 LAB — BASIC METABOLIC PANEL
Anion gap: 7 (ref 5–15)
BUN: 13 mg/dL (ref 6–20)
CO2: 23 mmol/L (ref 22–32)
Calcium: 7.7 mg/dL — ABNORMAL LOW (ref 8.9–10.3)
Chloride: 111 mmol/L (ref 98–111)
Creatinine, Ser: <0.3 mg/dL — ABNORMAL LOW (ref 0.61–1.24)
Glucose, Bld: 79 mg/dL (ref 70–99)
Potassium: 3.2 mmol/L — ABNORMAL LOW (ref 3.5–5.1)
Sodium: 141 mmol/L (ref 135–145)

## 2019-04-22 LAB — URINE CULTURE

## 2019-04-22 LAB — CBC WITH DIFFERENTIAL/PLATELET
Abs Immature Granulocytes: 0.05 10*3/uL (ref 0.00–0.07)
Basophils Absolute: 0.1 10*3/uL (ref 0.0–0.1)
Basophils Relative: 1 %
Eosinophils Absolute: 0.2 10*3/uL (ref 0.0–0.5)
Eosinophils Relative: 3 %
HCT: 23.4 % — ABNORMAL LOW (ref 39.0–52.0)
Hemoglobin: 7 g/dL — ABNORMAL LOW (ref 13.0–17.0)
Immature Granulocytes: 1 %
Lymphocytes Relative: 19 %
Lymphs Abs: 1.4 10*3/uL (ref 0.7–4.0)
MCH: 25.7 pg — ABNORMAL LOW (ref 26.0–34.0)
MCHC: 29.9 g/dL — ABNORMAL LOW (ref 30.0–36.0)
MCV: 86 fL (ref 80.0–100.0)
Monocytes Absolute: 1 10*3/uL (ref 0.1–1.0)
Monocytes Relative: 14 %
Neutro Abs: 4.5 10*3/uL (ref 1.7–7.7)
Neutrophils Relative %: 62 %
Platelets: 353 10*3/uL (ref 150–400)
RBC: 2.72 MIL/uL — ABNORMAL LOW (ref 4.22–5.81)
RDW: 16.4 % — ABNORMAL HIGH (ref 11.5–15.5)
WBC: 7.2 10*3/uL (ref 4.0–10.5)
nRBC: 0 % (ref 0.0–0.2)

## 2019-04-22 LAB — CK: Total CK: 5 U/L — ABNORMAL LOW (ref 49–397)

## 2019-04-22 LAB — MRSA PCR SCREENING: MRSA by PCR: POSITIVE — AB

## 2019-04-22 MED ORDER — MUPIROCIN 2 % EX OINT
1.0000 "application " | TOPICAL_OINTMENT | Freq: Two times a day (BID) | CUTANEOUS | Status: AC
  Administered 2019-04-22 – 2019-04-26 (×10): 1 via NASAL
  Filled 2019-04-22: qty 22

## 2019-04-22 MED ORDER — POTASSIUM CHLORIDE CRYS ER 20 MEQ PO TBCR
40.0000 meq | EXTENDED_RELEASE_TABLET | Freq: Once | ORAL | Status: AC
  Administered 2019-04-22: 40 meq via ORAL
  Filled 2019-04-22: qty 2

## 2019-04-22 NOTE — Progress Notes (Signed)
PROGRESS NOTE  [name] [name]  DOB: Nov 28, 1965  PCP: Andria Frames, PA-C NTI:[id number]  DOA: 04/21/2019  LOS: 1 day   Chief Complaint  Patient presents with  . Possible UTI   Brief narrative: [name] [name] is a 53 y.o. male with medical history significant of quadriplegia due to a previous C5 fracture, history of decubitus ulcers, recurrent UTIs, colostomy/suprapubic catheter, chronic hypotension. Patient presented to the ED on 12/6 with complaint of nausea and vomiting. Patient feels similarly as he did with his prior UTI. No fever, had some chills. In the ED, patient had a temp of 99.2, he was tachycardic to 120s, hypotensive to 70s, breathing comfortably on room air.   Work-up showed WBC count of 15, potassium 2.9, normal renal function. Patient was admitted to hospitalist medicine service for sepsis secondary to UTI  Subjective: Patient was seen and examined this morning.  Pleasant middle-aged African-American male.  Quadriplegic at baseline. Lying down in bed. No new symptoms.  Assessment/Plan: Sepsis due to UTI caused by presence of suprapubic catheter -Met sepsis criteria on admission with elevated white count, hypotension, significant tachycardia -Patient with multiple drug-resistant bacteria's in the past including VRE, ESBL E. coli as well as Carbapenem resistant Pseudomonas -Admitting hospitalist discussed with ID Dr Baxter Flattery and started the patient ceftazidime-avibactam as well as daptomycin. -With IV fluids, antibiotics, sepsis parameters improving.  Chronic hypotension -Possibly worsened by sepsis, continue home midodrine -Continue IV fluid  Sacral pressure ulcer with chronic osteomyelitis -Recently hospitalized for this found to be bacteremic with Acinetobacter, discharged on Bactrim and meropenem -ID consult  Hypokalemia -Replete and monitor -40 mEq oral KCl ordered today.  Anemia of chronic disease -Continue iron supplementation  Quadriplegia  -Continue home medications  Mobility: Quadriplegic, bedbound at baseline Diet: Regular diet  Fluid: LR at 100 mL/h DVT prophylaxis:  Lovenox Code Status:  Full code Family Communication:  Expected Discharge:  Home probably in 2 to 3 days  Consultants:  Infectious disease  Procedures:    Antimicrobials: Anti-infectives (From admission, onward)   Start     Dose/Rate Route Frequency Ordered Stop   04/21/19 1000  ceftazidime-avibactam (AVYCAZ) 2.5 g in dextrose 5 % 50 mL IVPB     2.5 g 25 mL/hr over 2 Hours Intravenous Every 8 hours 04/21/19 0828     04/21/19 1000  DAPTOmycin (CUBICIN) 500 mg in sodium chloride 0.9 % IVPB     500 mg 220 mL/hr over 30 Minutes Intravenous Daily 04/21/19 0902     04/21/19 0830  linezolid (ZYVOX) IVPB 600 mg  Status:  Discontinued     600 mg 300 mL/hr over 60 Minutes Intravenous  Once 04/21/19 0826 04/21/19 0829   04/21/19 0630  piperacillin-tazobactam (ZOSYN) IVPB 3.375 g     3.375 g 100 mL/hr over 30 Minutes Intravenous  Once 04/21/19 8676 04/21/19 1950        Code Status: Full Code   Diet Order            Diet regular Room service appropriate? Yes; Fluid consistency: Thin  Diet effective now              Infusions:  . ceftazidime avibactam (AVYCAZ) IVPB Stopped (04/22/19 1204)  . DAPTOmycin (CUBICIN)  IV Stopped (04/21/19 1136)  . lactated ringers 100 mL/hr at 04/22/19 1339    Scheduled Meds: . acidophilus  1 capsule Oral Daily  . baclofen  20 mg Oral BID  . Chlorhexidine Gluconate Cloth  6 each Topical Daily  .  enoxaparin (LOVENOX) injection  40 mg Subcutaneous Q24H  . ferrous sulfate  325 mg Oral Q breakfast  . fesoterodine  8 mg Oral q morning - 10a  . magnesium oxide  400 mg Oral Q breakfast  . metoCLOPramide  10 mg Oral TID WC  . midodrine  10 mg Oral TID WC  . mupirocin ointment  1 application Nasal BID  . pantoprazole  40 mg Oral BID  . sodium chloride flush  10-40 mL Intracatheter Q12H  . sucralfate  1 g Oral TID  WC & HS  . vitamin C  500 mg Oral Daily  . zinc sulfate  220 mg Oral Daily    PRN meds: acetaminophen, guaiFENesin, levalbuterol, ondansetron, senna-docusate, sodium chloride flush   Objective: Vitals:   04/22/19 1200 04/22/19 1300  BP: 98/66 (!) 82/64  Pulse: 93 (!) 101  Resp: 17 17  Temp: 98 F (36.7 C)   SpO2: 100% 99%    Intake/Output Summary (Last 24 hours) at 04/22/2019 1624 Last data filed at 04/22/2019 1339 Gross per 24 hour  Intake 2933.67 ml  Output 875 ml  Net 2058.67 ml   Filed Weights   04/21/19 0444 04/21/19 1130  Weight: 77.7 kg 68.9 kg   Weight change: -8.8 kg Body mass index is 20.6 kg/m.   Physical Exam: General exam: Appears calm and comfortable.  Skin: No rashes, lesions or ulcers. HEENT: Atraumatic, normocephalic, supple neck, no obvious bleeding Lungs: Clear to auscultation bilaterally CVS: Regular rate and rhythm, no murmur GI/Abd soft, nontender, nondistended, bowel sound present CNS: Alert, awake, oriented x3, quadriplegic at baseline Psychiatry: Mood appropriate Extremities: Contracted extremities, chronic pedal edema and scattered ulcers.  Data Review: I have personally reviewed the laboratory data and studies available.  Recent Labs  Lab 04/21/19 0346 04/22/19 0737  WBC 15.3* 7.2  NEUTROABS 11.9* 4.5  HGB 8.5* 7.0*  HCT 28.2* 23.4*  MCV 84.7 86.0  PLT 464* 353   Recent Labs  Lab 04/21/19 0346 04/22/19 0737  NA 141 141  K 2.9* 3.2*  CL 109 111  CO2 22 23  GLUCOSE 154* 79  BUN 25* 13  CREATININE 0.54* <0.30*  CALCIUM 8.3* 7.7*    Terrilee Croak, MD  [facility] 04/22/2019

## 2019-04-22 NOTE — Consult Note (Signed)
[facility] Nurse Consult Note: Reason for Consult: Patient well known to our team for his chronic, nonhealing pressure injuries and has been seen by plastic surgery in the past. In October of this year he went to the OR and has [facility] applied (03/11/19). They are following him as an outpatient. Mr. Wool was last seen 3 weeks ago by my partner, S. Tora Perches.  Seen today in ICU 1231. Please note:  I ordered ostomy care yesterday upon admission so that Nursing could obtain necessary supplies. I was not on site yesterday, so I did not see patient's wounds until today.  Mr. Llanos is admitted for nausea, a symptom he commonly associates with a UTI. The patient's wounds are treated with saline moistened gauze, covered with silicone foam or ABD pads and changed twice daily. Wound type:Pressure (numerousl chronic) Pressure Injury POA: Yes Measurement: Sacral wound:  Chronic, Stage 4:  Multiple open areas. Wound bed appearance is red, moist.  Small amount (<10%) is yellow stringy tissue. Wounds are separated by thin skin bridges.  Total area measures approximately 30cm x 30cm x 0.4cm. Bone is palpable. Left hip: Chronic, Stage 4: 8cm x 6cm x 0.2cm, mostly red (80%) with 20% yellow stringy material. Bone is palpable. Right hip: Chronic Stage 3,measurements are 6cm x 10cm x 0.4cm. Moderate amount yellow drainage, musty odor. Right trochanter full thickness wound 1.5cm x 1.5cm x 0.4cm with red wound bed and moderate amount yellow drainage. Left trochanter:  Chronic Stage 3, 6cm x 5cm x 0.5cm. Red wound bed, small amount serous drainage.  Wound bed:As described above Drainage (amount, consistency, odor) As described above Periwound:intact, dry. Dressing procedure/placement/frequency: Patient is on a mattress replacement with low air loss feature while in ICU.  I will continue this therapy upon transfer to floor. I will provide Nursing with guidance via the orders to dress the wounds with saline moistened gauze dressings topped  with dry gauze and covered wither with a silicone foam or ABD pads (the buttocks) secured with 2-inch paper tape.  Turning and repositioning is in place per protocol.  If desired, may consult with Plastics to save patient transport to office for follow up.   [facility] nursing team will not follow, but will remain available to this patient, the nursing and medical teams.  Please re-consult if needed. Thanks, Maudie Flakes, MSN, RN, [place], Arther Abbott  Pager# [phone]

## 2019-04-23 LAB — BASIC METABOLIC PANEL
Anion gap: 8 (ref 5–15)
BUN: 10 mg/dL (ref 6–20)
CO2: 21 mmol/L — ABNORMAL LOW (ref 22–32)
Calcium: 7.5 mg/dL — ABNORMAL LOW (ref 8.9–10.3)
Chloride: 112 mmol/L — ABNORMAL HIGH (ref 98–111)
Creatinine, Ser: 0.3 mg/dL — ABNORMAL LOW (ref 0.61–1.24)
GFR calc Af Amer: >60 mL/min (ref >60)
GFR calc non Af Amer: >60 mL/min (ref >60)
Glucose, Bld: 95 mg/dL (ref 70–99)
Potassium: 3.1 mmol/L — ABNORMAL LOW (ref 3.5–5.1)
Sodium: 141 mmol/L (ref 135–145)

## 2019-04-23 LAB — CBC WITH DIFFERENTIAL/PLATELET
Abs Immature Granulocytes: 0.09 10*3/uL — ABNORMAL HIGH (ref 0.00–0.07)
Basophils Absolute: 0 10*3/uL (ref 0.0–0.1)
Basophils Relative: 1 %
Eosinophils Absolute: 0.2 10*3/uL (ref 0.0–0.5)
Eosinophils Relative: 3 %
HCT: 23.5 % — ABNORMAL LOW (ref 39.0–52.0)
Hemoglobin: 7.1 g/dL — ABNORMAL LOW (ref 13.0–17.0)
Immature Granulocytes: 1 %
Lymphocytes Relative: 14 %
Lymphs Abs: 1.2 10*3/uL (ref 0.7–4.0)
MCH: 26.2 pg (ref 26.0–34.0)
MCHC: 30.2 g/dL (ref 30.0–36.0)
MCV: 86.7 fL (ref 80.0–100.0)
Monocytes Absolute: 1.1 10*3/uL — ABNORMAL HIGH (ref 0.1–1.0)
Monocytes Relative: 13 %
Neutro Abs: 5.8 10*3/uL (ref 1.7–7.7)
Neutrophils Relative %: 68 %
Platelets: 427 10*3/uL — ABNORMAL HIGH (ref 150–400)
RBC: 2.71 MIL/uL — ABNORMAL LOW (ref 4.22–5.81)
RDW: 16.6 % — ABNORMAL HIGH (ref 11.5–15.5)
WBC: 8.4 10*3/uL (ref 4.0–10.5)
nRBC: 0 % (ref 0.0–0.2)

## 2019-04-23 MED ORDER — SODIUM CHLORIDE 0.9 % IV BOLUS
1000.0000 mL | Freq: Once | INTRAVENOUS | Status: AC
  Administered 2019-04-23: 1000 mL via INTRAVENOUS

## 2019-04-23 MED ORDER — ONDANSETRON HCL 4 MG/2ML IJ SOLN
4.0000 mg | Freq: Four times a day (QID) | INTRAMUSCULAR | Status: DC | PRN
  Administered 2019-04-23 – 2019-05-04 (×10): 4 mg via INTRAVENOUS
  Filled 2019-04-23 (×11): qty 2

## 2019-04-23 MED ORDER — SODIUM CHLORIDE 0.9 % IV SOLN: 2.0000 g | Freq: Three times a day (TID) | INTRAVENOUS | Status: DC

## 2019-04-23 MED ORDER — POTASSIUM CHLORIDE 10 MEQ/100ML IV SOLN
10.0000 meq | INTRAVENOUS | Status: AC
  Administered 2019-04-23 (×3): 10 meq via INTRAVENOUS
  Filled 2019-04-23 (×3): qty 100

## 2019-04-23 MED ORDER — BOOST PLUS PO LIQD
237.0000 mL | Freq: Three times a day (TID) | ORAL | Status: DC
  Administered 2019-04-23 – 2019-05-06 (×22): 237 mL via ORAL
  Filled 2019-04-23 (×42): qty 237

## 2019-04-23 MED ORDER — PRO-STAT SUGAR FREE PO LIQD
30.0000 mL | Freq: Two times a day (BID) | ORAL | Status: DC
  Filled 2019-04-23 (×3): qty 30

## 2019-04-23 MED ORDER — ADULT MULTIVITAMIN W/MINERALS CH
1.0000 | ORAL_TABLET | Freq: Every day | ORAL | Status: DC
  Administered 2019-04-24 – 2019-05-06 (×13): 1 via ORAL
  Filled 2019-04-23 (×13): qty 1

## 2019-04-23 MED ORDER — CEFTAZIDIME AND DEXTROSE 2-5 GM-%(50ML) IV SOLR
2.0000 g | Freq: Three times a day (TID) | INTRAVENOUS | Status: DC
  Filled 2019-04-23: qty 50

## 2019-04-23 MED ORDER — SODIUM CHLORIDE 0.9 % IV SOLN
2.0000 g | Freq: Three times a day (TID) | INTRAVENOUS | Status: DC
  Administered 2019-04-24 – 2019-05-06 (×38): 2 g via INTRAVENOUS
  Filled 2019-04-23 (×40): qty 2

## 2019-04-23 MED ORDER — PROMETHAZINE HCL 25 MG/ML IJ SOLN
12.5000 mg | Freq: Once | INTRAMUSCULAR | Status: AC
  Administered 2019-04-23: 12.5 mg via INTRAVENOUS
  Filled 2019-04-23: qty 1

## 2019-04-23 MED ORDER — SODIUM CHLORIDE 0.9 % IV SOLN
1.0000 g | Freq: Three times a day (TID) | INTRAVENOUS | Status: DC
  Filled 2019-04-23: qty 1

## 2019-04-23 MED ORDER — POTASSIUM CHLORIDE CRYS ER 20 MEQ PO TBCR
40.0000 meq | EXTENDED_RELEASE_TABLET | Freq: Once | ORAL | Status: DC
  Filled 2019-04-23: qty 2

## 2019-04-23 MED ORDER — METOCLOPRAMIDE HCL 5 MG/ML IJ SOLN
10.0000 mg | Freq: Once | INTRAMUSCULAR | Status: AC
  Administered 2019-04-23: 10 mg via INTRAVENOUS
  Filled 2019-04-23: qty 2

## 2019-04-23 NOTE — Progress Notes (Signed)
Initial Nutrition Assessment  RD working remotely.   DOCUMENTATION CODES:   Not applicable  INTERVENTION:  - will order Boost Plus TID, each supplement provides 360 kcal and 14 grams of protein. - will order 30 mL Prostat BID, each supplement provides 100 kcal and 15 grams of protein. - will order daily multivitamin with minerals.  - continue to encourage PO intakes.    NUTRITION DIAGNOSIS:   Increased nutrient needs related to acute illness, wound healing(sepsis) as evidenced by estimated needs.  GOAL:   Patient will meet greater than or equal to 90% of their needs  MONITOR:   PO intake, Supplement acceptance, Labs, Weight trends  REASON FOR ASSESSMENT:   Other (Comment)(Pressure Injury report)  ASSESSMENT:   53 y.o. male with medical history of quadriplegia d/t previous C5 fracture, decubitus ulcers, recurrent UTIs, colostomy/suprapubic catheter, and chronic hypotension. He presented to the ED on 12/6 with complaint of N/V and felt similarly as he did with his prior UTI. No fever, had some chills. He was admitted for sepsis 2/2 UTI.  Patient consumed 25% of breakfast and 100% of lunch and dinner yesterday. Patient with hx of quadriplegia and IBW adjusted accordingly. RD working remotely today and unable to contact patient via phone.   Per chart review, current weight is 152 lb and weight has been consistently trending down since the summer months. Will continue to monitor trend and assess for malnutrition when feasible.   RD is familiar with patient from previous hospitalizations. At the time of interview during previous hospitalizations patient reported drinking Boost Plus and juven at home and that he likes these supplements. Will order juven once sepsis physiology is resolving/has resolved.   Per notes: - sepsis d/t UTI - MRSA - chronic hypotension, possibly worsened by sepsis - pressure injuries with chronic osteomyelitis  - hypokalemia--repletion ordered - anemia  of chronic disease   Labs reviewed; K: 3.1 mmol/l, Cl: 112 mmol/l, creatinine: 0.3 mg/dl, Ca: 7.5 mg/dl. Medications reviewed; 1 capsule risaquad/day, 325 mg ferrous sulfate/day, 400 mg mag-ox/day, 10 mg IV reglan x1 dose 12/8, 10 mg oral reglan TID, 40 mg oral protonix BID, 40 mEq Klor-Con x1 dose 12/7 and x1 dose 12/8, 1 g carafate TID, 500 mg ascorbic acid/day, 200 mg zinc sulfate/day.  IVF; LR @ 100 ml/hr.     NUTRITION - FOCUSED PHYSICAL EXAM:  unable to complete at this time.   Diet Order:   Diet Order            Diet regular Room service appropriate? Yes; Fluid consistency: Thin  Diet effective now              EDUCATION NEEDS:   No education needs have been identified at this time  Skin:  Skin Assessment: Skin Integrity Issues: Skin Integrity Issues:: Stage III, Stage IV, Stage II Stage II: L toe Stage III: bilateral hips, R flank, R shoulder Stage IV: R thigh, R buttocks  Last BM:  PTA/unknown  Height:   Ht Readings from Last 1 Encounters:  04/21/19 6' (1.829 m)    Weight:   Wt Readings from Last 1 Encounters:  04/21/19 68.9 kg    Ideal Body Weight:  72.8 kg  BMI:  Body mass index is 20.6 kg/m.  Estimated Nutritional Needs:   Kcal:  [id number] kcal  Protein:  120-135 grams  Fluid:  >/= 2.2 L/day      [name], MS, RD, LDN, [facility] Inpatient Clinical Dietitian Pager # [phone] After hours/weekend pager # [phone]

## 2019-04-23 NOTE — Progress Notes (Signed)
PHARMACY NOTE -  Ceftazidime  Pharmacy has been assisting with dosing of ceftazidime for pseudomonas cUTI.  Dosage remains stable at 2g IV q8 hr and need for further dosage adjustment appears unlikely at present given SCr at baseline  Pharmacy will sign off, following peripherally for culture results or dose adjustments. Please reconsult if a change in clinical status warrants re-evaluation of dosage.  Reuel Boom, PharmD, BCPS [phone] 04/23/2019, 7:30 PM

## 2019-04-23 NOTE — Progress Notes (Signed)
MD contacted regarding pt's sustained hypotension tonight. 1L NS bolus ordered and is infusing now.

## 2019-04-23 NOTE — Progress Notes (Addendum)
Patient is reporting feeling nauseated and is refusing dressing changes at this time. RN has given all PRN medications and has asked the pt three different times if he was willing to do the dressing changes. He has been educated on importance and reasoning behind the dressing changes. RN will report this to daytime RN who will make the rounding MD aware.

## 2019-04-23 NOTE — Progress Notes (Signed)
PROGRESS NOTE  [name] [name]  DOB: 07-Dec-1965  PCP: Andria Frames, PA-C KZS:[id number]  DOA: 04/21/2019  LOS: 2 days   Chief Complaint  Patient presents with  . Possible UTI   Brief narrative: [name] [name] is a 53 y.o. male with medical history significant of quadriplegia due to a previous C5 fracture, history of decubitus ulcers, recurrent UTIs, colostomy/suprapubic catheter, chronic hypotension. Patient presented to the ED on 12/6 with complaint of nausea and vomiting. Patient feels similarly as he did with his prior UTI. No fever, had some chills. In the ED, patient had a temp of 99.2, he was tachycardic to 120s, hypotensive to 70s, breathing comfortably on room air.   Work-up showed WBC count of 15, potassium 2.9, normal renal function. Patient was admitted to hospitalist medicine service for sepsis secondary to UTI  Subjective: Patient was seen and examined this morning. Pleasant middle-aged African-American male. Quadriplegic at baseline.  Propped up in bed.  He was in distress this morning because of persistent nausea.  Vomited once this morning.  No abdominal pain.  No fever.  Passing gas.  Assessment/Plan: Sepsis due to UTI caused by presence of suprapubic catheter -Met sepsis criteria on admission with elevated white count, hypotension, significant tachycardia -Patient has history of UTI with with multiple drug-resistant bacteria's including VRE, ESBL E. coli as well as Carbapenem resistant Pseudomonas -Admitting hospitalist discussed with ID Dr Baxter Flattery and started the patient ceftazidime-avibactam as well as daptomycin.  Continue same antibiotics. -With IV fluids, antibiotics, sepsis parameters improving.  No fever, WBC count improving. -I noticed that he did not have blood culture done on admission.  I ordered for blood cultures yesterday 12/7.  Nausea/vomiting -Reports intermittent nausea and vomiting at home. -Had an episode of vomiting this morning.  On IV Zofran  PRN.  Chronic hypotension -Possibly worsened by sepsis, continue home midodrine -Continue IV fluid  Sacral pressure ulcer with chronic osteomyelitis -Recently hospitalized for this found to be bacteremic with Acinetobacter, following which he completed a course of Bactrim and meropenem -ID consult  Hypokalemia -Replete and monitor -potassium level again low at 3.1 today.  IV KCl 10 mEq X 3 ordered.    Anemia of chronic disease -Continue iron supplementation  Quadriplegia -Continue home medications  Mobility: Quadriplegic, bedbound at baseline Diet: Regular diet  Fluid: LR at 100 mL/h.  Reduce to 75 mL/h today. DVT prophylaxis:  Lovenox Code Status:  Full code Family Communication:  Expected Discharge:  Home probably in 1 to 2 days. Consultants:  Infectious disease  Procedures:    Antimicrobials: Anti-infectives (From admission, onward)   Start     Dose/Rate Route Frequency Ordered Stop   04/21/19 1000  ceftazidime-avibactam (AVYCAZ) 2.5 g in dextrose 5 % 50 mL IVPB     2.5 g 25 mL/hr over 2 Hours Intravenous Every 8 hours 04/21/19 0828     04/21/19 1000  DAPTOmycin (CUBICIN) 500 mg in sodium chloride 0.9 % IVPB     500 mg 220 mL/hr over 30 Minutes Intravenous Daily 04/21/19 0902     04/21/19 0830  linezolid (ZYVOX) IVPB 600 mg  Status:  Discontinued     600 mg 300 mL/hr over 60 Minutes Intravenous  Once 04/21/19 0826 04/21/19 0829   04/21/19 0630  piperacillin-tazobactam (ZOSYN) IVPB 3.375 g     3.375 g 100 mL/hr over 30 Minutes Intravenous  Once 04/21/19 7322 04/21/19 0254        Code Status: Full Code   Diet Order  Diet regular Room service appropriate? Yes; Fluid consistency: Thin  Diet effective now              Infusions:  . ceftazidime avibactam (AVYCAZ) IVPB 25 mL/hr at 04/23/19 1000  . DAPTOmycin (CUBICIN)  IV Stopped (04/22/19 2142)  . lactated ringers 100 mL/hr at 04/23/19 1000  . potassium chloride 10 mEq (04/23/19 1035)     Scheduled Meds: . acidophilus  1 capsule Oral Daily  . baclofen  20 mg Oral BID  . Chlorhexidine Gluconate Cloth  6 each Topical Daily  . enoxaparin (LOVENOX) injection  40 mg Subcutaneous Q24H  . feeding supplement (PRO-STAT SUGAR FREE 64)  30 mL Oral BID  . ferrous sulfate  325 mg Oral Q breakfast  . fesoterodine  8 mg Oral q morning - 10a  . lactose free nutrition  237 mL Oral TID WC  . magnesium oxide  400 mg Oral Q breakfast  . metoCLOPramide  10 mg Oral TID WC  . midodrine  10 mg Oral TID WC  . multivitamin with minerals  1 tablet Oral Daily  . mupirocin ointment  1 application Nasal BID  . pantoprazole  40 mg Oral BID  . sodium chloride flush  10-40 mL Intracatheter Q12H  . sucralfate  1 g Oral TID WC & HS  . vitamin C  500 mg Oral Daily  . zinc sulfate  220 mg Oral Daily    PRN meds: acetaminophen, guaiFENesin, levalbuterol, ondansetron (ZOFRAN) IV, ondansetron, senna-docusate, sodium chloride flush   Objective: Vitals:   04/23/19 0800 04/23/19 1000  BP: 109/84 97/62  Pulse: (!) 113 (!) 105  Resp: (!) 25 (!) 21  Temp: 98.5 F (36.9 C)   SpO2: 99% 97%    Intake/Output Summary (Last 24 hours) at 04/23/2019 1100 Last data filed at 04/23/2019 1000 Gross per 24 hour  Intake 4597.23 ml  Output 1275 ml  Net 3322.23 ml   Filed Weights   04/21/19 0444 04/21/19 1130  Weight: 77.7 kg 68.9 kg   Weight change:  Body mass index is 20.6 kg/m.   Physical Exam: General exam: Mild distress this morning because of nausea. Skin: No rashes, lesions or ulcers. HEENT: Atraumatic, normocephalic, supple neck, no obvious bleeding Lungs: Clear to auscultation bilaterally CVS: Regular rate and rhythm, no murmur GI/Abd soft, nontender, nondistended, bowel sound present CNS: Alert, awake, oriented x3, quadriplegic at baseline Psychiatry: Mood appropriate Extremities: Contracted extremities, chronic pedal edema and scattered ulcers.  Data Review: I have personally reviewed the  laboratory data and studies available.  Recent Labs  Lab 04/21/19 0346 04/22/19 0737 04/23/19 0445  WBC 15.3* 7.2 8.4  NEUTROABS 11.9* 4.5 5.8  HGB 8.5* 7.0* 7.1*  HCT 28.2* 23.4* 23.5*  MCV 84.7 86.0 86.7  PLT 464* 353 427*   Recent Labs  Lab 04/21/19 0346 04/22/19 0737 04/23/19 0445  NA 141 141 141  K 2.9* 3.2* 3.1*  CL 109 111 112*  CO2 22 23 21*  GLUCOSE 154* 79 95  BUN 25* 13 10  CREATININE 0.54* <0.30* 0.30*  CALCIUM 8.3* 7.7* 7.5*    Terrilee Croak, MD  [facility] 04/23/2019

## 2019-04-23 NOTE — Progress Notes (Signed)
JABAREE, STRANTZ ([id number]) Visit Report for 01/14/2019 Arrival Information Details Patient Name: Date of Service: [name] [name], [name] [name] 01/14/2019 2:00 PM Medical Record [id number] Patient Account Number: [id number] Date of Birth/Sex: Treating RN: [birth year] (53 y.o. Jerilynn Mages) Carlene Coria Primary Care Brinley Treanor: Park Meo Other Clinician: Referring Madlyn Crosby: Treating Micaila Ziemba/Extender:Robson, Edythe Clarity, Campbell Riches in Treatment: 2 Visit Information History Since Last Visit All ordered tests and consults were completed: No Patient Arrived: Wheel Chair Added or deleted any medications: No Arrival Time: 15:08 Any new allergies or adverse reactions: No Accompanied By: self Had a fall or experienced change in No activities of daily living that may affect Transfer Assistance: None risk of falls: Patient Identification Verified: Yes Signs or symptoms of abuse/neglect since last No Secondary Verification Process Completed: Yes visito Patient Requires Transmission-Based No Hospitalized since last visit: No Precautions: Implantable device outside of the clinic excluding No Patient Has Alerts: Yes cellular tissue based products placed in the center since last visit: Has Dressing in Place as Prescribed: Yes Pain Present Now: No Electronic Signature(s) Signed: 04/23/2019 3:07:45 PM By: Carlene Coria RN Entered By: Carlene Coria on 01/14/2019 15:08:52 -------------------------------------------------------------------------------- Encounter Discharge Information Details Patient Name: Date of Service: [name], [name] 01/14/2019 2:00 PM Medical Record [id number] Patient Account Number: [id number] Date of Birth/Sex: Treating RN: 29-Apr-1966 (53 y.o. [name] Primary Care Shonette Rhames: Park Meo Other Clinician: Referring Abdullah Rizzi: Treating Tilford Deaton/Extender:Robson, Edythe Clarity, Campbell Riches in Treatment: [phone] Encounter Discharge Information Items Post Procedure  Vitals Discharge Condition: Stable Temperature (F): 98.6 Ambulatory Status: Wheelchair Pulse (bpm): 64 Discharge Destination: Home Respiratory Rate (breaths/min): 18 Transportation: Private Auto Blood Pressure (mmHg): 142/90 Accompanied By: self Schedule Follow-up Appointment: Yes Clinical Summary of Care: Electronic Signature(s) Signed: 01/14/2019 5:34:15 PM By: [name] Entered By: [name] on 01/14/2019 17:30:10 -------------------------------------------------------------------------------- Multi Wound Chart Details Patient Name: Date of Service: [name], [name] 01/14/2019 2:00 PM Medical Record [id number] Patient Account Number: [id number] Date of Birth/Sex: Treating RN: April 21, 1966 (53 y.o. 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] [37:Pressure Ulcer] [57:Pressure Ulcer] Comorbid History: [34:Anemia, Sleep Apnea, Hypertension, Quadriplegia, Hypertension, Quadriplegia, Seizure Disorder] [37:Anemia, Sleep Apnea, Seizure Disorder] [57:Anemia, Sleep Apnea, Hypertension, Quadriplegia, Seizure Disorder] Date Acquired: [34:05/17/2010] [37:12/15/2015] [57:09/18/2017] Weeks of Treatment: [34:152] [37:152] [57:69] Wound Status: [34:Open] [37:Open] [57:Open] Clustered Wound: [34:Yes] [37:No] [57:Yes] Clustered Quantity: [34:1] [37:N/A] [57:1] Measurements L x W x D 7x13.5x0.1 [37:14.5x7x0.1] [57:0.7x1.5x3] (cm) Area (cm) : [34:74.22]  [37:79.718] [57:0.825] Volume (cm) : [34:7.422] [37:7.972] [57:2.474] % Reduction in Area: [34:81.30%] [37:-398.80%] [57:96.90%] % Reduction in Volume: 98.80% [37:-398.90%] [57:5.80%] Undermining: [34:No] [37:No] [57:No] Classification: [34:Category/Stage IV] [37:Category/Stage IV] [57:Category/Stage IV] Exudate Amount: [34:Small] [37:Medium] [57:Medium] Exudate Type: [34:Serosanguineous] [37:Serosanguineous] [57:Serosanguineous] Exudate Color: [34:red, brown] [37:red, brown] [57:red, brown] Wound Margin: [34:Distinct, outline attached] [37:Flat and Intact] [57:Indistinct, nonvisible] Granulation Amount: [34:Large (67-100%)] [37:Large (67-100%)] [57:Large (67-100%)] Granulation Quality: [34:Red, Pink] [37:Red, Pink] [57:Red, Pink] Necrotic Amount: [34:None Present (0%)] [37:Small (1-33%)] [57:None Present (0%)] Necrotic Tissue: [34:N/A] [37:Adherent Slough] [57:N/A] Exposed Structures: [34:Fat Layer (Subcutaneous Tissue) Exposed: Yes Fascia: No Tendon: No Muscle: No Joint: No Bone: No] [37:Fat Layer (Subcutaneous Tissue) Exposed: Yes Fascia: No Tendon: No Muscle: No Joint: No Bone: No] [57:Fat Layer (  Subcutaneous Tissue) Exposed: Yes  Fascia: No Tendon: No Muscle: No Joint: No Bone: No] Epithelialization: [34:None] [37:None] [57:None] Debridement: [34:N/A] [37:N/A] [57:N/A] Level: [34:N/A] [37:N/A] [57:N/A] Debridement Area (sq cm):N/A [37:N/A] [57:N/A] Instrument: [34:N/A] [37:N/A] [57:N/A] Bleeding: [34:N/A] [37:N/A] [57:N/A] Hemostasis Achieved: [34:N/A] [37:N/A] [57:N/A] Procedural Pain: [34:N/A] [37:N/A] [57:N/A] Post Procedural Pain: [34:N/A] [37:N/A] [57:N/A] Debridement Treatment N/A [37:N/A] [57:N/A] Response: Post Debridement [34:N/A] [37:N/A] [57:N/A] Measurements L x W x D (cm) Post Debridement [34:N/A] [37:N/A] [57:N/A] Volume: (cm) Post Debridement Stage: N/A [37:N/A] [57:N/A] Procedures Performed: N/A [34:59] [37:N/A] [57:N/A 67 66] Photos: [34:No Photos] [37:No  Photos] [57:No Photos] Wound Location: [34:Left Ischium] [37:Left Trochanter - Lateral] [57:Left Elbow] Wounding Event: [34:Pressure Injury] [37:Pressure Injury] [57:Trauma] Primary 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:Pressure Ulcer] [37:Pressure Ulcer] [57:Abrasion] Comorbid History: [34:Anemia, Sleep Apnea, Hypertension, Quadriplegia, Seizure Disorder] [37:Anemia, Sleep Apnea, Hypertension, Quadriplegia, Seizure Disorder] [57:Anemia, Sleep Apnea, Hypertension, Quadriplegia, Seizure Disorder] Date Acquired: [34:10/17/2017] [37:11/13/2017] [57:07/26/2018] Weeks of Treatment: [34:64] [37:51] [57:24] Wound Status: [34:Open] [37:Open] [57:Open] Clustered Wound: [34:No] [37:No] [57:No] Clustered Quantity: [34:N/A] [37:N/A] [57:N/A] Measurements L x W x D 11.5x13x0.1 [37:6x4.5x0.1] [57:0.5x0.5x0.1] (cm) Area (cm) : [id number] [37:21.206] [57:0.196] Volume (cm) : [34:11.742] [37:2.121] [57:0.02] % Reduction in Area: [34:-75.80%] [37:-31.20%] [57:77.30%] % Reduction in Volume: 56.10% [37:-31.20%] [57:76.70%] Undermining: [34:No] [37:No] [57:No] Classification: [34:Category/Stage III] [37:Category/Stage IV] [57:Full Thickness Without Exposed Support Structures] Exudate Amount: [34:Medium] [37:Medium] [57:Medium] Exudate Type: [34:Serosanguineous] [37:Serosanguineous] [57:Serosanguineous] Exudate Color: [34:red, brown] [37:red, brown] [57:red, brown] Wound Margin: [34:Flat and Intact] [37:Well defined, not attached Distinct, outline attached] Granulation Amount: [34:Medium (34-66%)] [37:Large (67-100%)] [57:None Present (0%)] Granulation Quality: [34:Red] [37:Red, Pink] [57:N/A] Necrotic Amount: [34:Medium (34-66%)] [37:None Present (0%)] [57:Large (67-100%)] Necrotic Tissue: [34:Adherent Slough] [37:N/A] [57:Eschar] Exposed Structures: [34:Fat Layer (Subcutaneous Tissue) Exposed: Yes Fascia: No Tendon: No Muscle: No Joint: No Bone: No] [37:Fat Layer (Subcutaneous Tissue) Exposed: Yes Fascia: No Tendon: No Muscle: No  Joint: No Bone: No] [57:Fascia: No Fat Layer (Subcutaneous Tissue)  Exposed: No Tendon: No Muscle: No Joint: No Bone: No] Epithelialization: [34:None] [37:None] [57:None] Debridement: [34:N/A] [37:N/A] [57:N/A] Level: [34:N/A] [37:N/A] [57:N/A] Debridement Area (sq cm):N/A [37:N/A] [57:N/A] Instrument: [34:N/A] [37:N/A] [57:N/A] Bleeding: [34:N/A] [37:N/A] [57:N/A] Hemostasis Achieved: [34:N/A] [37:N/A] [57:N/A] Procedural Pain: [34:N/A] [37:N/A] [57:N/A] Post Procedural Pain: [34:N/A] [37:N/A] [57:N/A] Debridement Treatment N/A [37:N/A] [57:N/A] Response: Post Debridement [34:N/A] [37:N/A] [57:N/A] Measurements L x W x D (cm) Post Debridement [34:N/A] [37:N/A] [57:N/A] Volume: (cm) Post Debridement Stage: N/A [37:N/A] [57:N/A] Procedures Performed: N/A [34:67] [37:N/A 68] [57:N/A 69] Photos: [34:No Photos] [37:No Photos] [57:No Photos] Wound Location: [34:Perineum] [37:Right Upper Leg - Anterior Right Abdomen - Lower] [57:Quadrant - Lateral] Wounding Event: [34:Gradually Appeared] [37:Gradually Appeared] [57:Pressure Injury] 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927] [57:37.699] Volume (cm) : [34:4.32] [37:0.393] [57:75.398] % Reduction in Area: [34:-349.00%] [37:68.70%] [57:-204.80%] % Reduction in Volume: -349.10% [37:68.70%] [57:-5995.20%] Starting Position 1 [57:9] (o'clock): Ending  Position 1 [57:12] (o'clock): Maximum Distance 1 [57:2] (cm): Undermining: [34:No] [37:No] [57:Yes] Classification: [34:Category/Stage II] [37:Unstageable/Unclassified Unstageable/Unclassified] Exudate Amount: [34:Medium] [37:Medium] [57:Medium] Exudate Type: [34:Serosanguineous] [37:Serosanguineous] [57:Serosanguineous] Exudate Color: [34:red, brown] [37:red, brown] [57:red, brown] Wound Margin: [34:Distinct, outline attached Flat and Intact] [57:Flat and Intact] Granulation Amount: [  34:Large (67-100%)] [37:Medium (34-66%)] [57:Large (67-100%)] Granulation Quality: [34:Red, Pink] [37:Red] [57:Red] Necrotic Amount: [34:Small (1-33%)] [37:Medium (34-66%)] [57:Small (1-33%)] Necrotic Tissue: [34:Adherent Slough] [37:Eschar, Adherent Slough] [57:Eschar, Adherent Slough] Exposed Structures: [34:Fat Layer (Subcutaneous Tissue) Exposed: Yes Fascia: No Tendon: No Muscle: No Joint: No Bone: No] [37:Fascia: No Fat Layer (Subcutaneous Tissue) Exposed: No Tendon: No Muscle: No Joint: No Bone: No] [57:Fat Layer (Subcutaneous Tissue) Exposed: Yes  Fascia: No Tendon: No Muscle: No Joint: No Bone: No] Epithelialization: [34:None] [37:None] [57:None] Debridement: [34:N/A] [37:N/A] [57:Debridement - Selective/Open Wound] Pre-procedure [34:N/A] [37:N/A] [57:16:05] Verification/Time Out Taken: Level: [34:N/A] [37:N/A] [57:Skin/Epidermis] Debridement Area (sq cm):N/A [37:N/A] [57:4] Instrument: [34:N/A] [37:N/A] [57:Forceps, Scissors] Bleeding: [34:N/A] [37:N/A] [57:Minimum] Hemostasis Achieved: [34:N/A] [37:N/A] [57:Pressure] Procedural Pain: [34:N/A] [37:N/A] [57:0] Post Procedural Pain: [34:N/A] [37:N/A] [57:0] Debridement Treatment N/A [37:N/A] [57:Procedure was tolerated] Response: [57:well] Post Debridement [34:N/A] [37:N/A] [57:6x8x2] Measurements L x W x D (cm) Post Debridement [34:N/A] [37:N/A] [57:75.398] Volume: (cm) Post Debridement Stage: N/A [37:N/A]  [57:Unstageable/Unclassified] Procedures Performed: N/A [34:70] [37:N/A N/A] [57:Debridement N/A] Photos: [34:No Photos] [37:N/A] [57:N/A] Wound Location: [34:Abdomen - midline] [37:N/A] [57:N/A] Wounding Event: [34:Gradually Appeared] [37:N/A] [57:N/A] Primary 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:Pressure Ulcer] [37:N/A] [57:N/A] Comorbid History: [34:Anemia, Sleep Apnea, Hypertension, Quadriplegia, Seizure Disorder] [37:N/A] [57:N/A] Date Acquired: [34:11/30/2018] [37:N/A] [57:N/A] Weeks of Treatment: [34:6] [37:N/A] [57:N/A] Wound Status: [34:Open] [37:N/A] [57:N/A] Clustered Wound: [34:No] [37:N/A] [57:N/A] Clustered Quantity: [34:N/A] [37:N/A] [57:N/A] Measurements L x W x D 0.3x0.2x0.1 [37:N/A] [57:N/A] (cm) Area (cm) : [34:0.047] [37:N/A] [57:N/A] Volume (cm) : [34:0.005] [37:N/A] [57:N/A] % Reduction in Area: [34:96.00%] [37:N/A] [57:N/A] % Reduction in Volume: 95.80% [37:N/A] [57:N/A] Undermining: [34:No] [37:N/A] [57:N/A] Classification: [34:Category/Stage II] [37:N/A] [57:N/A] Exudate Amount: [34:None Present] [37:N/A] [57:N/A] Exudate Type: [34:N/A] [37:N/A] [57:N/A] Exudate Color: [34:N/A] [37:N/A] [57:N/A] Wound Margin: [34:Flat and Intact] [37:N/A] [57:N/A] Granulation Amount: [34:Large (67-100%)] [37:N/A] [57:N/A] Granulation Quality: [34:Pink, Pale] [37:N/A] [57:N/A] Necrotic Amount: [34:None Present (0%)] [37:N/A] [57:N/A] Necrotic Tissue: [34:N/A] [37:N/A] [57:N/A] Exposed Structures: [34:Fat Layer (Subcutaneous Tissue) Exposed: Yes Fascia: No Tendon: No Muscle: No Joint: No Bone: No] [37:N/A] [57:N/A] Epithelialization: [34:None] [37:N/A N/A] Debridement: [34:N/A] [37:N/A N/A] Level: [34:N/A] [37:N/A N/A] Debridement Area (sq cm):N/A [37:N/A N/A] Instrument: [34:N/A] [37:N/A N/A] Bleeding: [34:N/A] [37:N/A N/A] Hemostasis Achieved: [34:N/A] [37:N/A N/A] Procedural Pain: [34:N/A] [37:N/A N/A] Post Procedural Pain: [34:N/A] [37:N/A N/A] Debridement Treatment N/A [37:N/A  N/A] Response: Post Debridement [34:N/A] [37:N/A N/A] Measurements L x W x D (cm) Post Debridement [34:N/A] [37:N/A N/A] Volume: (cm) Post Debridement Stage: N/A [37:N/A N/A N/A N/A] Treatment Notes Wound #34 (Sacrum) 1. Cleanse With Wound Cleanser 2. Periwound Care Skin Prep 3. Primary Dressing Applied Calcium Alginate Ag 4. Secondary Dressing ABD Pad 5. Secured With Self Adhesive Bandage Wound #37 (Right Flank) 1. Cleanse With Wound Cleanser 2. Periwound Care Skin Prep 3. Primary Dressing Applied Calcium Alginate Ag 4. Secondary Dressing ABD Pad 5. Secured With Self Adhesive Bandage Wound #57 (Right Trochanter) 1. Cleanse With Wound Cleanser 2. Periwound Care Skin Prep 3. Primary Dressing Applied Calcium Alginate Ag 4. Secondary Dressing ABD Pad 5. Secured With Self Adhesive Bandage Wound #59 (Left Ischium) 1. Cleanse With Wound Cleanser 2. Periwound Care Skin Prep 3. Primary Dressing Applied Calcium Alginate Ag 4. Secondary Dressing ABD Pad 5. Secured With Self Adhesive Bandage Wound #62 (Left, Lateral Trochanter) 1. Cleanse With Wound Cleanser 2. Periwound Care Skin Prep 3. Primary Dressing Applied Calcium Alginate Ag 4. Secondary Dressing Foam Border Dressing 5. Secured With Self Adhesive Bandage Wound #66 (Left Elbow) 1. Cleanse With Wound Cleanser 2. Periwound  Care Skin Prep 3. Primary Dressing Applied Calcium Alginate Ag 4. Secondary Dressing Foam Border Dressing 5. Secured With Self Adhesive Bandage Wound #67 (Perineum) 1. Cleanse With Wound Cleanser 2. Periwound Care Skin Prep 3. Primary Dressing Applied Calcium Alginate Ag 4. Secondary Dressing ABD Pad 5. Secured With Self Adhesive Bandage Wound #68 (Right, Anterior Upper Leg) 1. Cleanse With Wound Cleanser 2. Periwound Care Skin Prep 3. Primary Dressing Applied Santyl 4. Secondary Dressing Foam Border Dressing 5. Secured With Self Adhesive Bandage Wound #69  (Right, Lateral Abdomen - Lower Quadrant) 1. Cleanse With Wound Cleanser 2. Periwound Care Skin Prep 3. Primary Dressing Applied Calcium Alginate Ag 4. Secondary Dressing Foam Border Dressing 5. Secured With Self Adhesive Bandage Wound #70 (Abdomen - midline) 1. Cleanse With Wound Cleanser 2. Periwound Care Skin Prep 3. Primary Dressing Applied Calcium Alginate Ag 4. Secondary Dressing Foam Border Dressing 5. Secured With Office manager) Signed: 01/14/2019 5:58:27 PM By: Linton Ham MD Signed: 01/14/2019 6:17:49 PM By: Levan Hurst RN, BSN Entered By: Linton Ham on 01/14/2019 17:42:21 -------------------------------------------------------------------------------- Multi-Disciplinary Care Plan Details Patient Name: Date of Service: Gaberial, Bois 01/14/2019 2:00 PM Medical Record [id number] Patient Account Number: [id number] Date of Birth/Sex: Treating RN: [birth year] (54 y.o. Janyth Contes Primary Care Darris Staiger: Park Meo Other Clinician: Referring Ermalinda Joubert: Treating Eunice Winecoff/Extender:Robson, Edythe Clarity, Campbell Riches in Treatment: 152 Active Inactive Pressure Nursing Diagnoses: Potential for impaired tissue integrity related to pressure, friction, moisture, and shear Goals: Patient/caregiver will verbalize understanding of pressure ulcer management Date Initiated: 02/12/2016 Target Resolution Date: 02/15/2019 Goal Status: Active Interventions: Assess offloading mechanisms upon admission and as needed Notes: Wound/Skin Impairment Nursing Diagnoses: Impaired tissue integrity Goals: Patient/caregiver will verbalize understanding of skin care regimen Date Initiated: 06/20/2017 Target Resolution Date: 02/15/2019 Goal Status: Active Ulcer/skin breakdown will heal within 14 weeks Date Initiated: 02/12/2016 Date Inactivated: 04/17/2017 Target Resolution Date: 04/17/2017 Unmet Reason: wounds not Goal Status: Unmet  healed Interventions: Assess patient/caregiver ability to perform ulcer/skin care regimen upon admission and as needed Notes: Electronic Signature(s) Signed: 01/14/2019 6:17:49 PM By: Levan Hurst RN, BSN Entered By: Levan Hurst on 01/14/2019 16:30:07 -------------------------------------------------------------------------------- Pain Assessment Details Patient Name: Date of Service: Amanda, [place] 01/14/2019 2:00 PM Medical Record [id number] Patient Account Number: [id number] Date of Birth/Sex: Treating RN: 11-06-1965 (53 y.o. Jerilynn Mages) Carlene Coria Primary Care Yamari Ventola: Park Meo Other Clinician: Referring Amia Rynders: Treating Imagene Boss/Extender:Robson, Edythe Clarity, Campbell Riches in Treatment: 152 Active Problems Location of Pain Severity and Description of Pain Patient Has Paino No Site Locations Pain Management and Medication Current Pain Management: Electronic Signature(s) Signed: 04/23/2019 3:07:45 PM By: Carlene Coria RN Entered By: Carlene Coria on 01/14/2019 15:09:39 -------------------------------------------------------------------------------- Patient/Caregiver Education Details Patient Name: Date of Service: [name] 8/31/2020andnbsp2:00 PM Medical Record [id number] Patient Account Number: [id number] Date of Birth/Gender: Treating RN: 09/03/1965 (53 y.o. Janyth Contes Primary Care Physician: Park Meo Other Clinician: Referring Physician: Treating Physician/Extender:Robson, Edythe Clarity, Campbell Riches in Treatment: 64 Education Assessment Education Provided To: Patient Education Topics Provided Wound/Skin Impairment: Methods: Explain/Verbal Responses: State content correctly Electronic Signature(s) Signed: 01/14/2019 6:17:49 PM By: Levan Hurst RN, BSN Entered By: Levan Hurst on 01/14/2019 17:35:39 -------------------------------------------------------------------------------- Wound Assessment Details Patient  Name: Date of Service: Ailan, Mandl 01/14/2019 2:00 PM Medical Record [id number] Patient Account Number: [id number] Date of Birth/Sex: Treating RN: 08/13/1965 (53 y.o. Oval Linsey Primary Care Latorria Zeoli: Park Meo Other Clinician: Referring Merril Isakson: Treating Deetya Drouillard/Extender:Robson, Edythe Clarity, Campbell Riches in Treatment: 152 Wound Status Wound Number: 34 Primary Pressure Ulcer Etiology:  Wound Location: Sacrum Wound Open Wounding Event: Pressure Injury Status: Date Acquired: 05/17/2010 Comorbid Anemia, Sleep Apnea, Hypertension, Weeks Of Treatment: 152 History: Quadriplegia, Seizure Disorder Clustered Wound: Yes Photos Photo Uploaded By: Marlou Sa on 01/15/2019 09:05:20 Wound Measurements Length: (cm) 7 % Reduction Width: (cm) 13.5 % Reduction Depth: (cm) 0.1 Epitheliali Clustered Quantity: 1 Tunneling: Area: (cm) 74.22 Underminin Volume: (cm) 7.422 Wound Description Classification: Category/Stage IV Foul Odor Wound Margin: Distinct, outline attached Slough/Fib Exudate Amount: Small Exudate Type: Serosanguineous Exudate Color: red, brown Wound Bed Granulation Amount: Large (67-100%) Granulation Quality: Red, Pink Fascia Expo Necrotic Amount: None Present (0%) Fat Layer ( Tendon Expo Muscle Expo Joint Expos Bone Expose Electronic Signature(s) Signed: 04/23/2019 3:07:45 PM By: Carlene Coria RN Entered By: Carlene Coria on 08/31/2 After Cleansing: No rino No Exposed Structure sed: No Subcutaneous Tissue) Exposed: Yes sed: No sed: No ed: No d: No 020 15:36:14 in Area: 81.3% in Volume: 98.8% zation: None No g: No -------------------------------------------------------------------------------- Wound Assessment Details Patient Name: Date of Service: Keisuke, Aldred 01/14/2019 2:00 PM Medical Record Number:[id number] Patient Account Number: [id number] Date of Birth/Sex: Treating RN: 1965-08-15 (53 y.o. Jerilynn Mages) Carlene Coria Primary Care  Lashawndra Lampkins: Park Meo Other Clinician: Referring Roxsana Riding: Treating Kristain Filo/Extender:Robson, Edythe Clarity, Suella [place] Weeks in Treatment: 152 Wound Status Wound Number: 37 Primary Pressure Ulcer Etiology: Wound Location: Right Flank Wound Open Wounding Event: Pressure Injury Status: Date Acquired: 12/15/2015 Comorbid Anemia, Sleep Apnea, Hypertension, Weeks Of Treatment: 152 History: Quadriplegia, Seizure Disorder Clustered Wound: No Photos Photo Uploaded By: Marlou Sa on 01/15/2019 09:05:41 Wound Measurements Length: (cm) 14.5 Width: (cm) 7 Depth: (cm) 0.1 Area: (cm) 79.718 Volume: (cm) 7.972 Wound Description Classification: Category/Stage IV Wound Margin: Flat and Intact Exudate Amount: Medium Exudate Type: Serosanguineous Exudate Color: red, brown Wound Bed Granulation Amount: Large (67-100%) Granulation Quality: Red, Pink Necrotic Amount: Small (1-33%) Necrotic Quality: Adherent Slough After Cleansing: No rino Yes Exposed Structure sed: No Subcutaneous Tissue) Exposed: Yes sed: No osed: No sed: No ed: No % Reduction in Area: -398.8% % Reduction in Volume: -398.9% Epithelialization: None Tunneling: No Undermining: No Foul Odor Slough/Fib Fascia Expo Fat Layer ( Tendon Expo Muscle Exp Joint Expo Bone Expos Electronic Signature(s) Signed: 04/23/2019 3:07:45 PM By: Carlene Coria RN Entered By: Carlene Coria on 01/14/2019 15:36:37 -------------------------------------------------------------------------------- Wound Assessment Details Patient Name: Date of Service: Elmon, Selman 01/14/2019 2:00 PM Medical Record Dolores Patient Account Number: [id number] Date of Birth/Sex: Treating RN: [birth year] (53 y.o. Jerilynn Mages) Carlene Coria Primary Care Calhoun Reichardt: Park Meo Other Clinician: Referring Denim Kalmbach: Treating [name] Lax/Extender:Robson, Edythe Clarity, Campbell Riches in Treatment: 152 Wound Status Wound Number: 57 Primary Pressure  Ulcer Etiology: Wound Location: Right Trochanter Wound Open Wounding Event: Pressure Injury Status: Date Acquired: 09/18/2017 Comorbid Anemia, Sleep Apnea, Hypertension, Weeks Of Treatment: 69 History: Quadriplegia, Seizure Disorder Clustered Wound: Yes Photos Photo Uploaded By: Marlou Sa on 01/15/2019 09:02:19 Wound Measurements Length: (cm) 0.7 Width: (cm) 1.5 Depth: (cm) 3 Clustered Quantity: 1 Area: (cm) 0.825 Volume: (cm) 2.474 Wound Description Classification: Category/Stage IV Wound Margin: Indistinct, nonvisible Exudate Amount: Medium Exudate Type: Serosanguineous Exudate Color: red, brown Wound Bed Granulation Amount: Large (67-100%) Granulation Quality: Red, Pink Necrotic Amount: None Present (0%) After Cleansing: No brino No Exposed Structure sed: No Subcutaneous Tissue) Exposed: Yes sed: No sed: No ed: No d: No % Reduction in Area: 96.9% % Reduction in Volume: 5.8% Epithelialization: None Tunneling: No Undermining: No Foul Odor Slough/Fi Fascia Expo Fat Layer ( Tendon Expo Muscle Expo Joint Expos Bone Expose Electronic Signature(s) Signed: 04/23/2019  3:07:45 PM By: Carlene Coria RN Entered By: Carlene Coria on 01/14/2019 15:37:02 -------------------------------------------------------------------------------- Wound Assessment Details Patient Name: Date of Service: Ashon, Gere 01/14/2019 2:00 PM Medical Record [place] Patient Account Number: [id number] Date of Birth/Sex: Treating RN: Jul 31, 1965 (53 y.o. Jerilynn Mages) Carlene Coria Primary Care Katharyn Schauer: Park Meo Other Clinician: Referring Cleto Claggett: Treating Nikitha Mode/Extender:Robson, Edythe Clarity, Campbell Riches in Treatment: 152 Wound Status Wound Number: 59 Primary Pressure Ulcer Etiology: Wound Location: Left Ischium Wound Open Wounding Event: Pressure Injury Status: Date Acquired: 10/17/2017 Comorbid Anemia, Sleep Apnea, Hypertension, Weeks Of Treatment: 64 History:  Quadriplegia, Seizure Disorder Clustered Wound: No Photos Photo Uploaded By: Marlou Sa on 01/15/2019 09:02:44 Wound Measurements Length: (cm) 11.5 Width: (cm) 13 Depth: (cm) 0.1 Area: (cm) 117.417 Volume: (cm) 11.742 Wound Description Classification: Category/Stage III Wound Margin: Flat and Intact Exudate Amount: Medium Exudate Type: Serosanguineous Exudate Color: red, brown Wound Bed Granulation Amount: Medium (34-66%) Granulation Quality: Red Necrotic Amount: Medium (34-66%) Necrotic Quality: Adherent Slough After Cleansing: No ibrino Yes Exposed Structure posed: No (Subcutaneous Tissue) Exposed: Yes posed: No posed: No osed: No sed: No % Reduction in Area: -75.8% % Reduction in Volume: 56.1% Epithelialization: None Tunneling: No Undermining: No Foul Odor Slough/F Fascia Ex Fat Layer Tendon Ex Muscle Ex Joint Exp Bone Expo Electronic Signature(s) Signed: 04/23/2019 3:07:45 PM By: Carlene Coria RN Entered By: Carlene Coria on 01/14/2019 15:37:25 -------------------------------------------------------------------------------- Wound Assessment Details Patient Name: Date of Service: Jarquise, Higgs 01/14/2019 2:00 PM Medical Record [place] Patient Account Number: [id number] Date of Birth/Sex: Treating RN: [birth year] (53 y.o. Jerilynn Mages) Carlene Coria Primary Care Tiara Bartoli: Park Meo Other Clinician: Referring Deja Kaigler: Treating Breshae Belcher/Extender:Robson, Edythe Clarity, Suella Grove Weeks in Treatment: 152 Wound Status Wound Number: 62 Primary Pressure Ulcer Etiology: Wound Location: Left Trochanter - Lateral Wound Open Wounding Event: Pressure Injury Status: Date Acquired: 11/13/2017 Comorbid Anemia, Sleep Apnea, Hypertension, Weeks Of Treatment: 51 History: Quadriplegia, Seizure Disorder Clustered Wound: No Photos Photo Uploaded By: Marlou Sa on 01/15/2019 09:03:18 Wound Measurements Length: (cm) 6 Width: (cm) 4.5 Depth: (cm)  0.1 Area: (cm) 21.206 Volume: (cm) 2.121 Wound Description Classification: Category/Stage IV Wound Margin: Well defined, not attached Exudate Amount: Medium Exudate Type: Serosanguineous Exudate Color: red, brown Wound Bed Granulation Amount: Large (67-100%) Granulation Quality: Red, Pink Necrotic Amount: None Present (0%) After Cleansing: No rino Yes Exposed Structure sed: No Subcutaneous Tissue) Exposed: Yes sed: No sed: No ed: No d: No % Reduction in Area: -31.2% % Reduction in Volume: -31.2% Epithelialization: None Tunneling: No Undermining: No Foul Odor Slough/Fib Fascia Expo Fat Layer ( Tendon Expo Muscle Expo Joint Expos Bone Expose Electronic Signature(s) Signed: 04/23/2019 3:07:45 PM By: Carlene Coria RN Entered By: Carlene Coria on 01/14/2019 15:37:53 -------------------------------------------------------------------------------- Wound Assessment Details Patient Name: Date of Service: Cashon, Mitri 01/14/2019 2:00 PM Medical Record Number:[id number] Patient Account Number: [id number] Date of Birth/Sex: Treating RN: 1965/11/04 (53 y.o. Jerilynn Mages) Carlene Coria Primary Care Derotha Fishbaugh: Park Meo Other Clinician: Referring Olayinka Gathers: Treating Azekiel Cremer/Extender:Robson, Edythe Clarity, Suella Grove Weeks in Treatment: 152 Wound Status Wound Number: 66 Primary Abrasion Etiology: Wound Location: Left Elbow Wound Open Wounding Event: Trauma Status: Date Acquired: 07/26/2018 Comorbid Anemia, Sleep Apnea, Hypertension, Weeks Of Treatment: 24 History: Quadriplegia, Seizure Disorder Clustered Wound: No Photos Photo Uploaded By: Marlou Sa on 01/15/2019 09:03:45 Wound Measurements Length: (cm) 0.5 % Reduct Width: (cm) 0.5 % Reduct Depth: (cm) 0.1 Epitheli Area: (cm) 0.196 Tunneli Volume: (cm) 0.02 Undermi Wound Description Classification: Full Thickness Without Exposed Support Foul Odo Structures Slough/F Wound Distinct, outline  attached Margin: Exudate Medium Amount:  Exudate Serosanguineous Type: Exudate red, brown Color: Wound Bed Granulation Amount: None Present (0%) Necrotic Amount: Large (67-100%) Fascia E Necrotic Quality: Eschar Fat Laye Tendon E Muscle E Joint Ex Bone Exp r After Cleansing: No ibrino No Exposed Structure xposed: No r (Subcutaneous Tissue) Exposed: No xposed: No xposed: No posed: No osed: No ion in Area: 77.3% ion in Volume: 76.7% alization: None ng: No ning: No Electronic Signature(s) Signed: 04/23/2019 3:07:45 PM By: Carlene Coria RN Entered By: Carlene Coria on 01/14/2019 15:38:23 -------------------------------------------------------------------------------- Wound Assessment Details Patient Name: Date of Service: Mclaren, Louch 01/14/2019 2:00 PM Medical Record [id number] Patient Account Number: [id number] Date of Birth/Sex: Treating RN: 04-14-1966 (53 y.o. Jerilynn Mages) Carlene Coria Primary Care Uchechi Denison: Park Meo Other Clinician: Referring Ples Trudel: Treating Alexsandria Kivett/Extender:Robson, Edythe Clarity, Campbell Riches in Treatment: 152 Wound Status Wound Number: 67 Primary Pressure Ulcer Etiology: Wound Location: Perineum Wound Open Wounding Event: Gradually Appeared Status: Date Acquired: 07/27/2018 Comorbid Anemia, Sleep Apnea, Hypertension, Weeks Of Treatment: 24 History: Quadriplegia, Seizure Disorder Clustered Wound: No Photos Photo Uploaded By: Marlou Sa on 01/15/2019 09:04:47 Wound Measurements Length: (cm) 11 % Reduct Width: (cm) 5 % Reduct Depth: (cm) 0.1 Epitheli Area: (cm) 43.197 Tunneli Volume: (cm) 4.32 Undermi Wound Description Classification: Category/Stage II Wound Margin: Distinct, outline attached Exudate Amount: Medium Exudate Type: Serosanguineous Exudate Color: red, brown Wound Bed Granulation Amount: Large (67-100%) Granulation Quality: Red, Pink Necrotic Amount: Small (1-33%) Necrotic Quality: Adherent  Slough Foul Odor After Cleansing: No Slough/Fibrino No Exposed Structure Fascia Exposed: No Fat Layer (Subcutaneous Tissue) Exposed: Yes Tendon Exposed: No Muscle Exposed: No Joint Exposed: No Bone Exposed: No ion in Area: -349% ion in Volume: -349.1% alization: None ng: No ning: No Electronic Signature(s) Signed: 04/23/2019 3:07:45 PM By: Carlene Coria RN Entered By: Carlene Coria on 01/14/2019 15:38:54 -------------------------------------------------------------------------------- Wound Assessment Details Patient Name: Date of Service: Casey, Sadlowski 01/14/2019 2:00 PM Medical Record [id number] Patient Account Number: [id number] Date of Birth/Sex: Treating RN: Oct 24, 1965 (53 y.o. Jerilynn Mages) Carlene Coria Primary Care Shaunie Boehm: Park Meo Other Clinician: Referring Kamya Watling: Treating Karalee Hauter/Extender:Robson, Edythe Clarity, Suella Grove Weeks in Treatment: 152 Wound Status Wound Number: 68 Primary Pressure Ulcer Etiology: Wound Location: Right Upper Leg - Anterior Wound Open Wounding Event: Gradually Appeared Status: Status: Date Acquired: 10/15/2018 Comorbid Anemia, Sleep Apnea, Hypertension, Weeks Of Treatment: 6 History: Quadriplegia, Seizure Disorder Clustered Wound: No Photos Photo Uploaded By: Marlou Sa on 01/15/2019 09:01:08 Wound Measurements Length: (cm) 2 Width: (cm) 2.5 Depth: (cm) 0.1 Area: (cm) 3.927 Volume: (cm) 0.393 Wound Description Classification: Unstageable/Unclassified Wound Margin: Flat and Intact Exudate Amount: Medium Exudate Type: Serosanguineous Exudate Color: red, brown Wound Bed Granulation Amount: Medium (34-66%) Granulation Quality: Red Necrotic Amount: Medium (34-66%) Necrotic Quality: Eschar, Adherent Slough After Cleansing: No brino Yes Exposed Structure osed: No (Subcutaneous Tissue) Exposed: No osed: No osed: No sed: No ed: No % Reduction in Area: 68.7% % Reduction in Volume: 68.7% Epithelialization:  None Tunneling: No Undermining: No Foul Odor Slough/Fi Fascia Exp Fat Layer Tendon Exp Muscle Exp Joint Expo Bone Expos Electronic Signature(s) Signed: 04/23/2019 3:07:45 PM By: Carlene Coria RN Entered By: Carlene Coria on 01/14/2019 15:39:19 -------------------------------------------------------------------------------- Wound Assessment Details Patient Name: Date of Service: Keondre, Gaudreau 01/14/2019 2:00 PM Medical Record Number:[id number] Patient Account Number: [id number] Date of Birth/Sex: Treating RN: 1965-10-06 (53 y.o. Oval Linsey Primary Care Deagan Sevin: Park Meo Other Clinician: Referring Carmino Ocain: Treating Markus Casten/Extender:Robson, Edythe Clarity, Campbell Riches in Treatment: 152 Wound Status Wound Number: 69 Primary Pressure Ulcer Etiology: Wound Location: Right Abdomen - Lower Quadrant -  Lateral Wound Open Status: Wounding Event: Pressure Injury Comorbid Anemia, Sleep Apnea, Hypertension, Date Acquired: 11/19/2018 History: Quadriplegia, Seizure Disorder Weeks Of Treatment: 6 Clustered Wound: No Photos Photo Uploaded By: Marlou Sa on 01/15/2019 09:01:29 Wound Measurements Length: (cm) 6 Width: (cm) 8 Depth: (cm) 2 Area: (cm) 37.699 Volume: (cm) 75.398 % Reduction in Area: -204.8% % Reduction in Volume: -5995.2% Epithelialization: None Tunneling: No Undermining: Yes Starting Position (o'clock): 9 Ending Position (o'clock): 12 Maximum Distance: (cm) 2 Wound Description Classification: Unstageable/Unclassified Foul Odor Wound Margin: Flat and Intact Slough/Fi Exudate Amount: Medium Exudate Type: Serosanguineous Exudate Color: red, brown Wound Bed Granulation Amount: Large (67-100%) Granulation Quality: Red Fascia Exp Necrotic Amount: Small (1-33%) Fat Layer Necrotic Quality: Eschar, Adherent Slough Tendon Exp Muscle Exp Joint Expo Bone Expos After Cleansing: No brino No Exposed Structure osed: No (Subcutaneous Tissue)  Exposed: Yes osed: No osed: No sed: No ed: No Electronic Signature(s) Signed: 04/23/2019 3:07:45 PM By: Carlene Coria RN Entered By: Carlene Coria on 01/14/2019 15:40:16 -------------------------------------------------------------------------------- Wound Assessment Details Patient Name: Date of Service: Sean, Lupton 01/14/2019 2:00 PM Medical Record [place] Patient Account Number: [id number] Date of Birth/Sex: Treating RN: 1965-08-19 (53 y.o. Jerilynn Mages) Carlene Coria Primary Care Clifford Coudriet: Park Meo Other Clinician: Referring Elishia Kaczorowski: Treating Zuriyah Shatz/Extender:Robson, Edythe Clarity, Campbell Riches in Treatment: 152 Wound Status Wound Number: 70 Primary Pressure Ulcer Etiology: Wound Location: Abdomen - midline Wound Open Wounding Event: Gradually Appeared Status: Date Acquired: 11/30/2018 Comorbid Anemia, Sleep Apnea, Hypertension, Weeks Of Treatment: 6 History: Quadriplegia, Seizure Disorder Clustered Wound: No Photos Photo Uploaded By: Marlou Sa on 01/15/2019 09:01:51 Wound Measurements Length: (cm) 0.3 Width: (cm) 0.2 Depth: (cm) 0.1 Area: (cm) 0.047 Volume: (cm) 0.005 Wound Description Classification: Category/Stage II Wound Margin: Flat and Intact Exudate Amount: None Present Wound Bed Granulation Amount: Large (67-100%) Granulation Quality: Pink, Pale Necrotic Amount: None Present (0%) After Cleansing: No rino No Exposed Structure sed: No Subcutaneous Tissue) Exposed: Yes sed: No sed: No ed: No d: No % Reduction in Area: 96% % Reduction in Volume: 95.8% Epithelialization: None Tunneling: No Undermining: No Foul Odor Slough/Fib Fascia Expo Fat Layer ( Tendon Expo Muscle Expo Joint Expos Bone Expose Electronic Signature(s) Signed: 04/23/2019 3:07:45 PM By: Carlene Coria RN Entered By: Carlene Coria on 01/14/2019 15:42:31 -------------------------------------------------------------------------------- Vitals Details Patient  Name: Date of Service: [name] 01/14/2019 2:00 PM Medical Record [id number] Patient Account Number: [id number] Date of Birth/Sex: Treating RN: 1965/10/06 (53 y.o. Jerilynn Mages) Carlene Coria Primary Care Nene Aranas: Park Meo Other Clinician: Referring Cierrah Dace: Treating Stokely Jeancharles/Extender:Robson, Edythe Clarity, Campbell Riches in Treatment: 152 Vital Signs Time Taken: 15:08 Temperature (F): 98.6 Height (in): 72 Pulse (bpm): 64 Weight (lbs): 195 Respiratory Rate (breaths/min): 18 Body Mass Index (BMI): 26.4 Blood Pressure (mmHg): 142/90 Reference Range: 80 - 120 mg / dl Electronic Signature(s) Signed: 04/23/2019 3:07:45 PM By: Carlene Coria RN Entered By: Carlene Coria on 01/14/2019 15:09:28

## 2019-04-23 NOTE — Progress Notes (Addendum)
[facility] for Infectious Disease    Date of Admission:  04/21/2019   Total days of antibiotics 4        Day 3 ceftazidime-avibactam and daptomycin   ID: [name] [name] is a 53 y.o. male with spinal cord injury history of sacral osteo and pressure wounds to sacrum and back with associated osteomyelitis admitted for sepsis on 12/6 hx of MDRO colonization. On admit, he had reprots of N/V which he associates with UTI. He was afebrile but tachycardic and hypotensive with sBP in 70-80s. He was fluid rescucitated and started empirically on ceftazidimen-avibactam. Unfortunately, blood cx were not able to be drawn. His UA/urine cx showed many WBC but urine culture had multiple organism - unclear if collected from catheter. He was last treated for acinetobacter bacteremia and pseudomonal uti  in mid November. It does not appear that he had his port removed. Today, he is Afebrile, reported to have nausea/vomiting when he gets UTI. Episode of hypotension overnight.leukocytosis improved on lab work.  Medications:  . acidophilus  1 capsule Oral Daily  . baclofen  20 mg Oral BID  . Chlorhexidine Gluconate Cloth  6 each Topical Daily  . enoxaparin (LOVENOX) injection  40 mg Subcutaneous Q24H  . feeding supplement (PRO-STAT SUGAR FREE 64)  30 mL Oral BID  . ferrous sulfate  325 mg Oral Q breakfast  . fesoterodine  8 mg Oral q morning - 10a  . lactose free nutrition  237 mL Oral TID WC  . magnesium oxide  400 mg Oral Q breakfast  . metoCLOPramide  10 mg Oral TID WC  . midodrine  10 mg Oral TID WC  . multivitamin with minerals  1 tablet Oral Daily  . mupirocin ointment  1 application Nasal BID  . pantoprazole  40 mg Oral BID  . sodium chloride flush  10-40 mL Intracatheter Q12H  . sucralfate  1 g Oral TID WC & HS  . vitamin C  500 mg Oral Daily  . zinc sulfate  220 mg Oral Daily    Objective: Vital signs in last 24 hours: Temp:  [97.9 F (36.6 C)-99.4 F (37.4 C)] 97.9 F (36.6 C) (12/08 1600)  Pulse Rate:  [77-114] 77 (12/08 1702) Resp:  [9-25] 23 (12/08 1702) BP: (70-153)/(51-90) 153/79 (12/08 1702) SpO2:  [92 %-100 %] 100 % (12/08 1702)    Lab Results Recent Labs    04/22/19 0737 04/23/19 0445  WBC 7.2 8.4  HGB 7.0* 7.1*  HCT 23.4* 23.5*  NA 141 141  K 3.2* 3.1*  CL 111 112*  CO2 23 21*  BUN 13 10  CREATININE <0.30* 0.30*   Liver Panel Recent Labs    04/21/19 0346  PROT 6.1*  ALBUMIN 2.0*  AST 11*  ALT 13  ALKPHOS 73  BILITOT 0.4    Microbiology: 12/7 blood cx pending Studies/Results: No results found.   Assessment/Plan: Sepsis possibly due to urinary source vs. Bacteremia (history of port in place and had acinetobacter bacteremia < 3 weeks ago) = recommend to switch from avycaz to ceftazidime.   Recommend to see if can have port removed. His indwelling line/portacath could  be nidus of his sepsis picture. His presentation suggests gram negative sepsis but unclear if he had bacteremia on admission since blood cx not drawn. I would still recommend removal of port. Did have acinetobacter on one set previous admission < 3 wks ago.  Can discontinue daptomycin if blood cx NGTD at 48hrs,  Carlyle Basques  [facility] Cell: [phone] Pager: [phone]  04/23/2019, 2:30PM

## 2019-04-24 LAB — CBC WITH DIFFERENTIAL/PLATELET
Abs Immature Granulocytes: 0.07 10*3/uL (ref 0.00–0.07)
Basophils Absolute: 0 10*3/uL (ref 0.0–0.1)
Basophils Relative: 0 %
Eosinophils Absolute: 0.3 10*3/uL (ref 0.0–0.5)
Eosinophils Relative: 4 %
HCT: 24.5 % — ABNORMAL LOW (ref 39.0–52.0)
Hemoglobin: 7.3 g/dL — ABNORMAL LOW (ref 13.0–17.0)
Immature Granulocytes: 1 %
Lymphocytes Relative: 21 %
Lymphs Abs: 2 10*3/uL (ref 0.7–4.0)
MCH: 25.7 pg — ABNORMAL LOW (ref 26.0–34.0)
MCHC: 29.8 g/dL — ABNORMAL LOW (ref 30.0–36.0)
MCV: 86.3 fL (ref 80.0–100.0)
Monocytes Absolute: 1 10*3/uL (ref 0.1–1.0)
Monocytes Relative: 10 %
Neutro Abs: 6.3 10*3/uL (ref 1.7–7.7)
Neutrophils Relative %: 64 %
Platelets: 393 10*3/uL (ref 150–400)
RBC: 2.84 MIL/uL — ABNORMAL LOW (ref 4.22–5.81)
RDW: 16.4 % — ABNORMAL HIGH (ref 11.5–15.5)
WBC: 9.7 10*3/uL (ref 4.0–10.5)
nRBC: 0 % (ref 0.0–0.2)

## 2019-04-24 LAB — CORTISOL: Cortisol, Plasma: 6.5 ug/dL

## 2019-04-24 LAB — BASIC METABOLIC PANEL
Anion gap: 5 (ref 5–15)
BUN: 7 mg/dL (ref 6–20)
CO2: 25 mmol/L (ref 22–32)
Calcium: 7.4 mg/dL — ABNORMAL LOW (ref 8.9–10.3)
Chloride: 112 mmol/L — ABNORMAL HIGH (ref 98–111)
Creatinine, Ser: <0.3 mg/dL — ABNORMAL LOW (ref 0.61–1.24)
Glucose, Bld: 83 mg/dL (ref 70–99)
Potassium: 3.4 mmol/L — ABNORMAL LOW (ref 3.5–5.1)
Sodium: 142 mmol/L (ref 135–145)

## 2019-04-24 MED ORDER — SODIUM CHLORIDE 0.9% FLUSH: 10.0000 mL | INTRAVENOUS | Status: DC | PRN

## 2019-04-24 MED ORDER — LACTATED RINGERS IV BOLUS
500.0000 mL | Freq: Once | INTRAVENOUS | Status: AC
  Administered 2019-04-24: 500 mL via INTRAVENOUS

## 2019-04-24 MED ORDER — COSYNTROPIN 0.25 MG IJ SOLR
0.2500 mg | Freq: Once | INTRAMUSCULAR | Status: AC
  Administered 2019-04-25: 0.25 mg via INTRAVENOUS
  Filled 2019-04-24: qty 0.25

## 2019-04-24 MED ORDER — SODIUM CHLORIDE 0.9% FLUSH
10.0000 mL | Freq: Two times a day (BID) | INTRAVENOUS | Status: DC
  Administered 2019-04-24 – 2019-05-04 (×16): 10 mL
  Administered 2019-05-04: 40 mL
  Administered 2019-05-05 – 2019-05-06 (×3): 10 mL

## 2019-04-24 NOTE — Progress Notes (Signed)
PROGRESS NOTE  [name] [name]  DOB: 06-Jun-1965  PCP: Andria Frames, PA-C HQR:[id number]  DOA: 04/21/2019  LOS: 3 days   Chief Complaint  Patient presents with  . Possible UTI   Brief narrative  [name] [name] is a 53 y.o. male with medical history significant of quadriplegia due to a previous C5 fracture, history of decubitus ulcers, recurrent UTIs, colostomy/suprapubic catheter, chronic hypotension. Patient presented to the ED on 12/6 with complaint of nausea and vomiting. Patient feels similarly as he did with his prior UTI. No fever, had some chills. In the ED, patient had a temp of 99.2, he was tachycardic to 120s, hypotensive to 70s, breathing comfortably on room air.   Work-up showed WBC count of 15, potassium 2.9, normal renal function. Patient was admitted to hospitalist medicine service for sepsis secondary to UTI  Subjective:  Patient himself denies any complaints, he was hypotensive overnight where he did require couple fluid boluses, he denies any symptoms with it .  Assessment/Plan:  Sepsis due to UTI caused by presence of suprapubic catheter -Met sepsis criteria on admission with elevated white count, hypotension, significant tachycardia -Patient has history of UTI with with multiple drug-resistant bacteria's including VRE, ESBL E. coli as well as Carbapenem resistant Pseudomonas -Antibiotics management per ID, switched from [place] to ceftazidime . -As well there is a concern about bacteremia, blood cultures were not done on admission, commendation is to remove port given he did have erythema back to her on previous admissions less than 3 weeks ago , will discuss with ID if he needs line holiday or not(given difficult  access, and likely he will need a line). -With IV fluids, antibiotics, sepsis parameters improving.  No fever, WBC count improving. -Blood cultures done 12/7, with no growth to date .  Nausea/vomiting -Resolved, continue with as needed meds   Chronic hypotension -Worsened with sepsis, he is chronically on midodrine, he is asymptomatic with these episodes . -With low cortisol level at 6.5 this a.m., will check cosyntropin  pain test before committing him to steroids . possibly worsened by sepsis, continue home midodrine -Continue IV fluid  Sacral pressure ulcer with chronic osteomyelitis -Recently hospitalized for this found to be bacteremic with Acinetobacter. - continue with wound care.  Hypokalemia -Repleted  Anemia of chronic disease -Continue iron supplementation  Quadriplegia -Continue home medications    Code Status:  Full code Family Communication:  Expected Discharge:  in 2-3 days. DVT prophylaxis:  Lovenox Consultants:  Infectious disease  Procedures:    Antimicrobials: Anti-infectives (From admission, onward)   Start     Dose/Rate Route Frequency Ordered Stop   04/24/19 0200  cefTAZidime (FORTAZ) 1 g in sodium chloride 0.9 % 100 mL IVPB  Status:  Discontinued     1 g 200 mL/hr over 30 Minutes Intravenous Every 8 hours 04/23/19 1930 04/23/19 1932   04/24/19 0200  cefTAZidime (FORTAZ) 2 g in sodium chloride 0.9 % 100 mL IVPB  Status:  Discontinued     2 g 200 mL/hr over 30 Minutes Intravenous Every 8 hours 04/23/19 1932 04/23/19 1934   04/24/19 0200  ceftAZIDime (FORTAZ) 2 gram/50 mL D5W IVPB - DUPLEX  Status:  Discontinued     2 g 100 mL/hr over 30 Minutes Intravenous Every 8 hours 04/23/19 1935 04/23/19 1948   04/24/19 0200  cefTAZidime (FORTAZ) 2 g in sodium chloride 0.9 % 100 mL IVPB     2 g 200 mL/hr over 30 Minutes Intravenous Every 8 hours 04/23/19  1948     04/21/19 1000  ceftazidime-avibactam (AVYCAZ) 2.5 g in dextrose 5 % 50 mL IVPB  Status:  Discontinued     2.5 g 25 mL/hr over 2 Hours Intravenous Every 8 hours 04/21/19 0828 04/23/19 1925   04/21/19 1000  DAPTOmycin (CUBICIN) 500 mg in sodium chloride 0.9 % IVPB     500 mg 220 mL/hr over 30 Minutes Intravenous Daily 04/21/19 0902      04/21/19 0830  linezolid (ZYVOX) IVPB 600 mg  Status:  Discontinued     600 mg 300 mL/hr over 60 Minutes Intravenous  Once 04/21/19 0826 04/21/19 0829   04/21/19 0630  piperacillin-tazobactam (ZOSYN) IVPB 3.375 g     3.375 g 100 mL/hr over 30 Minutes Intravenous  Once 04/21/19 4098 04/21/19 1191        Code Status: Full Code   Diet Order            Diet regular Room service appropriate? Yes; Fluid consistency: Thin  Diet effective now              Infusions:  . ceftAZIDime (FORTAZ) IVPB 2 gram/100 mL NS (Mini-Bag Plus) Stopped (04/24/19 1023)  . DAPTOmycin (CUBICIN)  IV Stopped (04/23/19 2005)  . lactated ringers Stopped (04/24/19 4782)    Scheduled Meds: . acidophilus  1 capsule Oral Daily  . baclofen  20 mg Oral BID  . Chlorhexidine Gluconate Cloth  6 each Topical Daily  . enoxaparin (LOVENOX) injection  40 mg Subcutaneous Q24H  . feeding supplement (PRO-STAT SUGAR FREE 64)  30 mL Oral BID  . ferrous sulfate  325 mg Oral Q breakfast  . fesoterodine  8 mg Oral q morning - 10a  . lactose free nutrition  237 mL Oral TID WC  . magnesium oxide  400 mg Oral Q breakfast  . metoCLOPramide  10 mg Oral TID WC  . midodrine  10 mg Oral TID WC  . multivitamin with minerals  1 tablet Oral Daily  . mupirocin ointment  1 application Nasal BID  . pantoprazole  40 mg Oral BID  . sodium chloride flush  10-40 mL Intracatheter Q12H  . sucralfate  1 g Oral TID WC & HS  . vitamin C  500 mg Oral Daily  . zinc sulfate  220 mg Oral Daily    PRN meds: acetaminophen, guaiFENesin, levalbuterol, ondansetron (ZOFRAN) IV, ondansetron, senna-docusate, sodium chloride flush   Objective: Vitals:   04/24/19 1000 04/24/19 1030  BP:  (!) 91/59  Pulse: (!) 102 96  Resp: 18 18  Temp:    SpO2: 96% 100%    Intake/Output Summary (Last 24 hours) at 04/24/2019 1208 Last data filed at 04/24/2019 1033 Gross per 24 hour  Intake 2378.74 ml  Output 1350 ml  Net 1028.74 ml   Filed Weights    04/21/19 0444 04/21/19 1130  Weight: 77.7 kg 68.9 kg   Weight change:  Body mass index is 20.6 kg/m.   Physical Exam:  Awake Alert, Oriented X 3, No new F.N deficits, Normal affect, chronically frail Symmetrical Chest wall movement, Good air movement bilaterally, CTAB, Port-A-Cath in the right upper chest looks clean RRR,No Gallops,Rubs or new Murmurs, No Parasternal Heave +ve B.Sounds, Abd Soft, No tenderness, No rebound - guarding or rigidity. No Cyanosis, lower extremity contracted. -Patient with multiple pressure ulcers   Data Review: I have personally reviewed the laboratory data and studies available.  Recent Labs  Lab 04/21/19 0346 04/22/19 0737 04/23/19 0445 04/24/19 0540  WBC  15.3* 7.2 8.4 9.7  NEUTROABS 11.9* 4.5 5.8 6.3  HGB 8.5* 7.0* 7.1* 7.3*  HCT 28.2* 23.4* 23.5* 24.5*  MCV 84.7 86.0 86.7 86.3  PLT 464* 353 427* 393   Recent Labs  Lab 04/21/19 0346 04/22/19 0737 04/23/19 0445 04/24/19 0540  NA 141 141 141 142  K 2.9* 3.2* 3.1* 3.4*  CL 109 111 112* 112*  CO2 22 23 21* 25  GLUCOSE 154* 79 95 83  BUN 25* 13 10 7   CREATININE 0.54* <0.30* 0.30* <0.30*  CALCIUM 8.3* 7.7* 7.5* 7.4*    Phillips Climes, MD  [facility] 04/24/2019

## 2019-04-24 NOTE — Progress Notes (Signed)
Patient's BP still low, [facility] p[facility].  500cc LR bolus given.  Cortisol level sent.

## 2019-04-24 NOTE — Progress Notes (Addendum)
[name] for Infectious Disease    Date of Admission:  04/21/2019   Total days of antibiotics 4          ID: [name] [name] is a 53 y.o. male with  Active Problems:   Sepsis ([place])    Subjective: Denies fever, N/V resolved  Medications:  . acidophilus  1 capsule Oral Daily  . baclofen  20 mg Oral BID  . Chlorhexidine Gluconate Cloth  6 each Topical Daily  . [START ON 04/25/2019] cosyntropin  0.25 mg Intravenous Once  . enoxaparin (LOVENOX) injection  40 mg Subcutaneous Q24H  . feeding supplement (PRO-STAT SUGAR FREE 64)  30 mL Oral BID  . ferrous sulfate  325 mg Oral Q breakfast  . fesoterodine  8 mg Oral q morning - 10a  . lactose free nutrition  237 mL Oral TID WC  . magnesium oxide  400 mg Oral Q breakfast  . metoCLOPramide  10 mg Oral TID WC  . midodrine  10 mg Oral TID WC  . multivitamin with minerals  1 tablet Oral Daily  . mupirocin ointment  1 application Nasal BID  . pantoprazole  40 mg Oral BID  . sodium chloride flush  10-40 mL Intracatheter Q12H  . sodium chloride flush  10-40 mL Intracatheter Q12H  . sucralfate  1 g Oral TID WC & HS  . vitamin C  500 mg Oral Daily  . zinc sulfate  220 mg Oral Daily    Objective: Vital signs in last 24 hours: Temp:  [97.3 F (36.3 C)-98.2 F (36.8 C)] 98 F (36.7 C) (12/09 0800) Pulse Rate:  [77-106] 106 (12/09 1600) Resp:  [10-26] 17 (12/09 1600) BP: (70-153)/(46-99) 93/63 (12/09 1600) SpO2:  [96 %-100 %] 99 % (12/09 1600) Physical Exam  Constitutional: He is oriented to person, place, and time,. He appears well-developed and well-nourished. No distress.  HENT:  Mouth/Throat: Oropharynx is clear and moist. No oropharyngeal exudate.  Chest wall: no tenderness about portacath no visible redness. Cardiovascular: Normal rate, regular rhythm and normal heart sounds. Exam reveals no gallop and no friction rub.  No murmur heard.  Pulmonary/Chest: Effort normal and breath sounds normal. No respiratory distress. He  has no wheezes.  Abdominal: Soft. Bowel sounds are normal. He exhibits no distension. There is no tenderness.  Neurological: He is alert and oriented to person, place, and time. Incomplete quad. Has some use of hands Skin: pressure wounds to left hip, sacrum covered, and feet.  Psychiatric: He has a normal mood and affect. His behavior is normal.   --------------------------------------------------------------------------------------------------------------------------------------- WOC nursing assessment: Sacral wound:  Chronic, Stage 4:  Multiple open areas. Wound bed appearance is red, moist.  Small amount (<10%) is yellow stringy tissue. Wounds are separated by thin skin bridges.  Total area measures approximately 30cm x 30cm x 0.4cm. Bone is palpable. Left hip: Chronic, Stage 4: 8cm x 6cm x 0.2cm, mostly red (80%) with 20% yellow stringy material. Bone is palpable.   Lab Results Recent Labs    04/23/19 0445 04/24/19 0540  WBC 8.4 9.7  HGB 7.1* 7.3*  HCT 23.5* 24.5*  NA 141 142  K 3.1* 3.4*  CL 112* 112*  CO2 21* 25  BUN 10 7  CREATININE 0.30* <0.30*    Microbiology: reviewed Studies/Results: No results found.   Assessment/Plan: Presumed gram negative sepsis = cultures negative, though drawn while on antibiotics. Plan to remove port but will likely need IV access, probable picc line or tunneled  line to complete course of abtx  - recommend to discontinue daptomycin. 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 Consider having plastic surgery evaluation to see if he is a candidate for acell application with wound vac?  [facility] for Infectious Diseases Cell: [phone] Pager: [phone]  04/24/19 9:00AM

## 2019-04-25 ENCOUNTER — Encounter (HOSPITAL_BASED_OUTPATIENT_CLINIC_OR_DEPARTMENT_OTHER): Payer: Medicare Other | Admitting: Internal Medicine

## 2019-04-25 DIAGNOSIS — A419 Sepsis, unspecified organism: Secondary | ICD-10-CM

## 2019-04-25 DIAGNOSIS — Z95828 Presence of other vascular implants and grafts: Secondary | ICD-10-CM

## 2019-04-25 LAB — BASIC METABOLIC PANEL
Anion gap: 6 (ref 5–15)
BUN: 6 mg/dL (ref 6–20)
CO2: 24 mmol/L (ref 22–32)
Calcium: 7.3 mg/dL — ABNORMAL LOW (ref 8.9–10.3)
Chloride: 109 mmol/L (ref 98–111)
Creatinine, Ser: <0.3 mg/dL — ABNORMAL LOW (ref 0.61–1.24)
Glucose, Bld: 117 mg/dL — ABNORMAL HIGH (ref 70–99)
Potassium: 3.1 mmol/L — ABNORMAL LOW (ref 3.5–5.1)
Sodium: 139 mmol/L (ref 135–145)

## 2019-04-25 LAB — ACTH STIMULATION, 3 TIME POINTS
Cortisol, 30 Min: 21.3 ug/dL
Cortisol, 60 Min: 27.2 ug/dL
Cortisol, Base: 7.4 ug/dL

## 2019-04-25 LAB — CBC WITH DIFFERENTIAL/PLATELET
Abs Immature Granulocytes: 0.11 10*3/uL — ABNORMAL HIGH (ref 0.00–0.07)
Basophils Absolute: 0.1 10*3/uL (ref 0.0–0.1)
Basophils Relative: 0 %
Eosinophils Absolute: 0.6 10*3/uL — ABNORMAL HIGH (ref 0.0–0.5)
Eosinophils Relative: 4 %
HCT: 24.4 % — ABNORMAL LOW (ref 39.0–52.0)
Hemoglobin: 7.3 g/dL — ABNORMAL LOW (ref 13.0–17.0)
Immature Granulocytes: 1 %
Lymphocytes Relative: 17 %
Lymphs Abs: 2.6 10*3/uL (ref 0.7–4.0)
MCH: 25.9 pg — ABNORMAL LOW (ref 26.0–34.0)
MCHC: 29.9 g/dL — ABNORMAL LOW (ref 30.0–36.0)
MCV: 86.5 fL (ref 80.0–100.0)
Monocytes Absolute: 1.5 10*3/uL — ABNORMAL HIGH (ref 0.1–1.0)
Monocytes Relative: 10 %
Neutro Abs: 10.6 10*3/uL — ABNORMAL HIGH (ref 1.7–7.7)
Neutrophils Relative %: 68 %
Platelets: 433 10*3/uL — ABNORMAL HIGH (ref 150–400)
RBC: 2.82 MIL/uL — ABNORMAL LOW (ref 4.22–5.81)
RDW: 16.5 % — ABNORMAL HIGH (ref 11.5–15.5)
WBC: 15.3 10*3/uL — ABNORMAL HIGH (ref 4.0–10.5)
nRBC: 0 % (ref 0.0–0.2)

## 2019-04-25 MED ORDER — POTASSIUM CHLORIDE CRYS ER 20 MEQ PO TBCR
40.0000 meq | EXTENDED_RELEASE_TABLET | Freq: Once | ORAL | Status: AC
  Administered 2019-04-25: 40 meq via ORAL
  Filled 2019-04-25: qty 2

## 2019-04-25 MED ORDER — POTASSIUM CHLORIDE 10 MEQ/100ML IV SOLN
10.0000 meq | INTRAVENOUS | Status: AC
  Administered 2019-04-25 (×4): 10 meq via INTRAVENOUS
  Filled 2019-04-25 (×4): qty 100

## 2019-04-25 NOTE — Progress Notes (Signed)
PROGRESS NOTE  [name] [name]  DOB: [birth year]  PCP: Andria Frames, PA-C [id number]  DOA: 04/21/2019  LOS: 4 days   Chief Complaint  Patient presents with  . Possible UTI   Brief narrative  [name] [name] is a 53 y.o. male with medical history significant of quadriplegia due to a previous C5 fracture, history of decubitus ulcers, recurrent UTIs, colostomy/suprapubic catheter, chronic hypotension. Patient presented to the ED on 12/6 with complaint of nausea and vomiting. Patient feels similarly as he did with his prior UTI. No fever, had some chills. In the ED, patient had a temp of 99.2, he was tachycardic to 120s, hypotensive to 70s, breathing comfortably on room air.   Work-up showed WBC count of 15, potassium 2.9, normal renal function. Patient was admitted to hospitalist medicine service for sepsis secondary to UTI  Subjective:  Patient denies any complaints today, no significant events as discussed with staff.  Assessment/Plan:  Sepsis due to UTI caused by presence of suprapubic catheter -Met sepsis criteria on admission with elevated white count, hypotension, significant tachycardia -Patient has history of UTI with with multiple drug-resistant bacteria's including VRE, ESBL E. coli as well as Carbapenem resistant Pseudomonas -Antibiotics management per ID, switched from [place] to ceftazidime . -As well there is a concern about bacteremia, blood cultures were done this admission after he received IV antibiotics, recommendation is to remove Port-A-Cath given he did have bacteremia on previous admissions less than 3 weeks. -Consulted to remove Port-A-Cath, patient currently has midline. -With IV fluids, antibiotics, sepsis parameters improving.  No fever, WBC count improving. -Blood cultures done 12/7, with no growth to date .  Nausea/vomiting -Resolved, continue with as needed meds  Chronic hypotension -Worsened with sepsis, he is chronically on midodrine, he is  asymptomatic with these episodes . -With low cortisol level at 6.5 , initially, cosyntropin test has been performed with good response , indication to start on Solu-Cortef . -Continue IV fluid  Sacral pressure ulcer with chronic osteomyelitis -Recently hospitalized for this found to be bacteremic with Acinetobacter. - continue with wound care.  Hypokalemia -Potassium 3.1 today, repleted  Anemia of chronic disease -Continue iron supplementation, transfuse for hemoglobin less than 7.  Quadriplegia -Continue home medications    Code Status:  Full code Family Communication: D/W patient Expected Discharge:  in 2-3 days. DVT prophylaxis:  Lovenox Consultants:  Infectious disease  Procedures:    Antimicrobials: Anti-infectives (From admission, onward)   Start     Dose/Rate Route Frequency Ordered Stop   04/24/19 0200  cefTAZidime (FORTAZ) 1 g in sodium chloride 0.9 % 100 mL IVPB  Status:  Discontinued     1 g 200 mL/hr over 30 Minutes Intravenous Every 8 hours 04/23/19 1930 04/23/19 1932   04/24/19 0200  cefTAZidime (FORTAZ) 2 g in sodium chloride 0.9 % 100 mL IVPB  Status:  Discontinued     2 g 200 mL/hr over 30 Minutes Intravenous Every 8 hours 04/23/19 1932 04/23/19 1934   04/24/19 0200  ceftAZIDime (FORTAZ) 2 gram/50 mL D5W IVPB - DUPLEX  Status:  Discontinued     2 g 100 mL/hr over 30 Minutes Intravenous Every 8 hours 04/23/19 1935 04/23/19 1948   04/24/19 0200  cefTAZidime (FORTAZ) 2 g in sodium chloride 0.9 % 100 mL IVPB     2 g 200 mL/hr over 30 Minutes Intravenous Every 8 hours 04/23/19 1948     04/21/19 1000  ceftazidime-avibactam (AVYCAZ) 2.5 g in dextrose 5 % 50 mL IVPB  Status:  Discontinued     2.5 g 25 mL/hr over 2 Hours Intravenous Every 8 hours 04/21/19 0828 04/23/19 1925   04/21/19 1000  DAPTOmycin (CUBICIN) 500 mg in sodium chloride 0.9 % IVPB  Status:  Discontinued     500 mg 220 mL/hr over 30 Minutes Intravenous Daily 04/21/19 0902 04/25/19 0752    04/21/19 0830  linezolid (ZYVOX) IVPB 600 mg  Status:  Discontinued     600 mg 300 mL/hr over 60 Minutes Intravenous  Once 04/21/19 0826 04/21/19 0829   04/21/19 0630  piperacillin-tazobactam (ZOSYN) IVPB 3.375 g     3.375 g 100 mL/hr over 30 Minutes Intravenous  Once 04/21/19 6812 04/21/19 7517        Code Status: Full Code   Diet Order            Diet regular Room service appropriate? Yes; Fluid consistency: Thin  Diet effective now              Infusions:  . ceftAZIDime (FORTAZ) IVPB 2 gram/100 mL NS (Mini-Bag Plus) Stopped (04/25/19 1036)  . lactated ringers 75 mL/hr at 04/25/19 1049    Scheduled Meds: . acidophilus  1 capsule Oral Daily  . baclofen  20 mg Oral BID  . Chlorhexidine Gluconate Cloth  6 each Topical Daily  . enoxaparin (LOVENOX) injection  40 mg Subcutaneous Q24H  . feeding supplement (PRO-STAT SUGAR FREE 64)  30 mL Oral BID  . ferrous sulfate  325 mg Oral Q breakfast  . fesoterodine  8 mg Oral q morning - 10a  . lactose free nutrition  237 mL Oral TID WC  . magnesium oxide  400 mg Oral Q breakfast  . metoCLOPramide  10 mg Oral TID WC  . midodrine  10 mg Oral TID WC  . multivitamin with minerals  1 tablet Oral Daily  . mupirocin ointment  1 application Nasal BID  . pantoprazole  40 mg Oral BID  . sodium chloride flush  10-40 mL Intracatheter Q12H  . sodium chloride flush  10-40 mL Intracatheter Q12H  . sucralfate  1 g Oral TID WC & HS  . vitamin C  500 mg Oral Daily  . zinc sulfate  220 mg Oral Daily    PRN meds: acetaminophen, guaiFENesin, levalbuterol, ondansetron (ZOFRAN) IV, ondansetron, senna-docusate, sodium chloride flush, sodium chloride flush   Objective: Vitals:   04/25/19 1100 04/25/19 1200  BP: (!) 83/49 106/65  Pulse: 95 94  Resp: 14 (!) 23  Temp:    SpO2: 100% 100%    Intake/Output Summary (Last 24 hours) at 04/25/2019 1224 Last data filed at 04/25/2019 1100 Gross per 24 hour  Intake 2962.25 ml  Output 1725 ml  Net  1237.25 ml   Filed Weights   04/21/19 0444 04/21/19 1130  Weight: 77.7 kg 68.9 kg   Weight change:  Body mass index is 20.6 kg/m.   Physical Exam:  Awake Alert, Oriented X 3, No new F.N deficits, Normal affect, frail, chronically ill-appearing Symmetrical Ct wall movement, Good air movement bilaterally, CTAB RRR,No Gallops,Rubs or new Murmurs, No Parasternal Heave +ve B.Sounds, Abd Soft, No tenderness, No rebound - guarding or rigidity. No Cyanosis, Clubbing or edema, contracted Patient with multiple pressure ulcers  Data Review: I have personally reviewed the laboratory data and studies available.  Recent Labs  Lab 04/21/19 0346 04/22/19 0737 04/23/19 0445 04/24/19 0540 04/25/19 0500  WBC 15.3* 7.2 8.4 9.7 15.3*  NEUTROABS 11.9* 4.5 5.8 6.3 10.6*  HGB 8.5*  7.0* 7.1* 7.3* 7.3*  HCT 28.2* 23.4* 23.5* 24.5* 24.4*  MCV 84.7 86.0 86.7 86.3 86.5  PLT 464* 353 427* 393 433*   Recent Labs  Lab 04/21/19 0346 04/22/19 0737 04/23/19 0445 04/24/19 0540 04/25/19 0500  NA 141 141 141 142 139  K 2.9* 3.2* 3.1* 3.4* 3.1*  CL 109 111 112* 112* 109  CO2 22 23 21* 25 24  GLUCOSE 154* 79 95 83 117*  BUN 25* 13 10 7 6   CREATININE 0.54* <0.30* 0.30* <0.30* <0.30*  CALCIUM 8.3* 7.7* 7.5* 7.4* 7.3*    Phillips Climes, MD  [facility] 04/25/2019

## 2019-04-25 NOTE — Progress Notes (Signed)
[facility] for Infectious Disease   Reason for visit: Follow up on sepsis  Interval History: blood cultures have remained negative. WBC up to 15.3, remains afebrile.   Day 6 total antibiotics Day 3 ceftazidime  Physical Exam: Constitutional:  Vitals:   04/25/19 1300 04/25/19 1400  BP: 94/62 110/68  Pulse: 94 98  Resp: 15 17  Temp:    SpO2: 100% 100%   patient appears in NAD Respiratory: Normal respiratory effort; CTA B Cardiovascular: RRR GI: soft, nt, nd  Review of Systems: Constitutional: negative for fevers and chills Integument/breast: negative for rash  Lab Results  Component Value Date   WBC 15.3 (H) 04/25/2019   HGB 7.3 (L) 04/25/2019   HCT 24.4 (L) 04/25/2019   MCV 86.5 04/25/2019   PLT 433 (H) 04/25/2019    Lab Results  Component Value Date   CREATININE <0.30 (L) 04/25/2019   BUN 6 04/25/2019   NA 139 04/25/2019   K 3.1 (L) 04/25/2019   CL 109 04/25/2019   CO2 24 04/25/2019    Lab Results  Component Value Date   ALT 13 04/21/2019   AST 11 (L) 04/21/2019   ALKPHOS 73 04/21/2019     Microbiology: Recent Results (from the past 240 hour(s))  Urine culture     Status: Abnormal   Collection Time: 04/21/19  3:46 AM   Specimen: In/Out Cath Urine  Result Value Ref Range Status   Specimen Description   Final    IN/OUT CATH URINE Performed at [facility], [place] [address] [place], [place] [address]    Special Requests   Final    NONE Performed at [facility], [place] [address], [place], [place] [address]    Culture MULTIPLE SPECIES PRESENT, SUGGEST RECOLLECTION (A)  Final   Report Status 04/22/2019 FINAL  Final  SARS CORONAVIRUS 2 (TAT 6-24 HRS) Nasopharyngeal Nasopharyngeal Swab     Status: None   Collection Time: 04/21/19  4:10 AM   Specimen: Nasopharyngeal Swab  Result Value Ref Range Status   SARS Coronavirus 2 NEGATIVE NEGATIVE Final    Comment: (NOTE) SARS-CoV-2 target nucleic acids are NOT  DETECTED. The SARS-CoV-2 RNA is generally detectable in upper and lower respiratory specimens during the acute phase of infection. Negative results do not preclude SARS-CoV-2 infection, do not rule out co-infections with other pathogens, and should not be used as the sole basis for treatment or other patient management decisions. Negative results must be combined with clinical observations, patient history, and epidemiological information. The expected result is Negative. Fact Sheet for Patients: SugarRoll.be Fact Sheet for Healthcare Providers: https://www.woods-mathews.com/ This test is not yet approved or cleared by the [place] FDA and  has been authorized for detection and/or diagnosis of SARS-CoV-2 by FDA under an Emergency Use Authorization (EUA). This EUA will remain  in effect (meaning this test can be used) for the duration of the COVID-19 declaration under Section 56 4(b)(1) of the Act, 21 U.S.C. section 360bbb-3(b)(1), unless the authorization is terminated or revoked sooner. Performed at [facility], [place] [address], [place], [place] [address]   MRSA PCR Screening     Status: Abnormal   Collection Time: 04/21/19 10:46 PM   Specimen: Nasal Mucosa; Nasopharyngeal  Result Value Ref Range Status   MRSA by PCR POSITIVE (A) NEGATIVE Final    Comment:        The GeneXpert MRSA Assay (FDA approved for NASAL specimens only), is one component of a  comprehensive MRSA colonization surveillance program. It is not intended to diagnose MRSA infection nor to guide or monitor treatment for MRSA infections. RESULT CALLED TO, READ BACK BY AND VERIFIED WITH: A,FLORACE AT [id number] ON 04/22/19 BY A,MOHAMED Performed at [place] [address] [place], [place] [address]   Culture, blood (routine x 2)     Status: None (Preliminary result)   Collection Time: 04/22/19  7:12 PM   Specimen: BLOOD RIGHT HAND  Result  Value Ref Range Status   Specimen Description   Final    BLOOD RIGHT HAND Performed at [facility] [address] [place], [place] [address]    Special Requests   Final    BOTTLES DRAWN AEROBIC ONLY Blood Culture adequate volume Performed at [place] [address] [place], [place] [address]    Culture   Final    NO GROWTH 3 DAYS Performed at [facility], [place] [address], [place], [place] [address]    Report Status PENDING  Incomplete  Culture, blood (routine x 2)     Status: None (Preliminary result)   Collection Time: 04/22/19  8:48 PM   Specimen: BLOOD  Result Value Ref Range Status   Specimen Description   Final    BLOOD BLOOD RIGHT HAND Performed at [place] [address], [place], [place] [address]    Special Requests   Final    BOTTLES DRAWN AEROBIC ONLY Blood Culture results may not be optimal due to an inadequate volume of blood received in culture bottles Performed at [facility] [address] [place], [place] [address]    Culture   Final    NO GROWTH 3 DAYS Performed at [facility], [place] [address] [place], [place] [address]    Report Status PENDING  Incomplete    Impression/Plan:  1. Presumed GNR sepsis - on ceftazidime and improving.  WBC noted and up again but afebrile.   I would treat for 10 days after port removal with ceftazidime.  2.  Port a cath - removal in the am.  I would wait until treatment completion of #1 before replacing.  May need picc line in its place at discharge.

## 2019-04-25 NOTE — Consult Note (Signed)
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 Signed: Earley Abide, PA-C 04/25/2019, 2:40 PM   I spent a total of 40 Minutes in face to face in clinical consultation, greater than 50% of which was counseling/coordinating care for bacteremia/Port-a-cath removal.

## 2019-04-26 ENCOUNTER — Inpatient Hospital Stay (HOSPITAL_COMMUNITY): Payer: Medicare Other

## 2019-04-26 HISTORY — PX: IR REMOVAL TUN ACCESS W/ PORT W/O FL MOD SED: IMG2290

## 2019-04-26 LAB — CBC
HCT: 23.7 % — ABNORMAL LOW (ref 39.0–52.0)
Hemoglobin: 7.1 g/dL — ABNORMAL LOW (ref 13.0–17.0)
MCH: 25.9 pg — ABNORMAL LOW (ref 26.0–34.0)
MCHC: 30 g/dL (ref 30.0–36.0)
MCV: 86.5 fL (ref 80.0–100.0)
Platelets: 473 10*3/uL — ABNORMAL HIGH (ref 150–400)
RBC: 2.74 MIL/uL — ABNORMAL LOW (ref 4.22–5.81)
RDW: 16.6 % — ABNORMAL HIGH (ref 11.5–15.5)
WBC: 15 10*3/uL — ABNORMAL HIGH (ref 4.0–10.5)
nRBC: 0 % (ref 0.0–0.2)

## 2019-04-26 LAB — BASIC METABOLIC PANEL
Anion gap: 6 (ref 5–15)
BUN: 10 mg/dL (ref 6–20)
CO2: 25 mmol/L (ref 22–32)
Calcium: 7.7 mg/dL — ABNORMAL LOW (ref 8.9–10.3)
Chloride: 109 mmol/L (ref 98–111)
Creatinine, Ser: <0.3 mg/dL — ABNORMAL LOW (ref 0.61–1.24)
Glucose, Bld: 107 mg/dL — ABNORMAL HIGH (ref 70–99)
Potassium: 3.8 mmol/L (ref 3.5–5.1)
Sodium: 140 mmol/L (ref 135–145)

## 2019-04-26 MED ORDER — LIDOCAINE-EPINEPHRINE 1 %-1:100000 IJ SOLN
INTRAMUSCULAR | Status: AC
  Administered 2019-04-26: 12:00:00
  Filled 2019-04-26: qty 1

## 2019-04-26 MED ORDER — LIDOCAINE-EPINEPHRINE (PF) 1 %-1:200000 IJ SOLN
INTRAMUSCULAR | Status: AC | PRN
  Administered 2019-04-26: 10 mL

## 2019-04-26 MED ORDER — MIDAZOLAM HCL 2 MG/2ML IJ SOLN
INTRAMUSCULAR | Status: AC
  Administered 2019-04-26: 10:00:00
  Filled 2019-04-26: qty 4

## 2019-04-26 MED ORDER — FENTANYL CITRATE (PF) 100 MCG/2ML IJ SOLN
INTRAMUSCULAR | Status: AC | PRN
  Administered 2019-04-26: 50 ug via INTRAVENOUS

## 2019-04-26 MED ORDER — LACTATED RINGERS IV BOLUS
500.0000 mL | Freq: Once | INTRAVENOUS | Status: AC
  Administered 2019-04-26: 500 mL via INTRAVENOUS

## 2019-04-26 MED ORDER — MIDAZOLAM HCL 2 MG/2ML IJ SOLN
INTRAMUSCULAR | Status: AC | PRN
  Administered 2019-04-26: 1 mg via INTRAVENOUS

## 2019-04-26 MED ORDER — FENTANYL CITRATE (PF) 100 MCG/2ML IJ SOLN
INTRAMUSCULAR | Status: AC
  Administered 2019-04-26: 10:00:00
  Filled 2019-04-26: qty 2

## 2019-04-26 NOTE — Progress Notes (Signed)
PROGRESS NOTE    [name] [name]  HBZ:[id number] DOB: 06-Aug-1965 DOA: 04/21/2019 PCP: Andria Frames, PA-C   Brief Narrative:  [name] Quirk Dixonis a 53 y.o.malewith medical history significant ofquadriplegia due to a previous C5 fracture, history of decubitus ulcers, recurrent UTIs, colostomy/suprapubic catheter, chronic hypotension. Patient presented to the ED on 12/6 with complaint of nausea and vomiting. Patient feels similarly as he did with his prior UTI. No fever, had some chills. In the ED, patient had a temp of 99.2, he was tachycardic to 120s, hypotensive to 70s, breathing comfortably on room air.   Work-up showed WBC count of 15, potassium 2.9, normal renal function. Patient was admitted to hospitalist medicine service for sepsis secondary to UTI.  Assessment & Plan:   Active Problems:   Sepsis ([place])  Sepsis due to UTI caused by presence of suprapubic catheter. -Met sepsis criteria on admission with elevated white count, hypotension, significant tachycardia -Patient has history of UTI with with multiple drug-resistant bacteria's including VRE, ESBL E. coli as well as Carbapenem resistant Pseudomonas -Antibiotics management per ID, switched from [place] to ceftazidime . -As well there is a concern about bacteremia, blood cultures were done this admission after he received IV antibiotics, recommendation is to remove Port-A-Cath given he did have bacteremia on previous admissions less than 3 weeks. - Consulted to remove Port-A-Cath, patient currently has midline. -With IV fluids, antibiotics, sepsis parameters improving.  No fever, WBC count improving. -Blood cultures done 12/7, with no growth to date . -Port-A-Cath removed today, PICC line inserted. -ID recommends IV ceftazidime 10 days after the port is removed.  Nausea/vomiting -Resolved, continue with as needed meds.  Chronic hypotension -Worsened with sepsis, he is chronically on midodrine, he is asymptomatic with  these episodes . -With low cortisol level at 6.5 , initially, cosyntropin test has been performed with good response , indication to start on Solu-Cortef . -Continue IV fluid  Sacral pressure ulcer with chronic osteomyelitis -Recently hospitalized for this found to be bacteremic with Acinetobacter. - continue with wound care.-  -Plastic surgery consult appreciated,  suggest patient might require debridement of multiple wounds and placement of ACell next week.   Hypokalemia - Resolved. -Potassium 3.1 today, repleted  Anemia of chronic disease -Continue iron supplementation, transfuse for hemoglobin less than 7.  Quadriplegia -Continue home medications   DVT prophylaxis: Lovenox Code Status: Full  Family Communication: D/W patient Disposition Plan:  2-3 days. Consultants:   Infectious disease  Procedures:  Antimicrobials:  Anti-infectives (From admission, onward)   Start     Dose/Rate Route Frequency Ordered Stop   04/24/19 0200  cefTAZidime (FORTAZ) 1 g in sodium chloride 0.9 % 100 mL IVPB  Status:  Discontinued     1 g 200 mL/hr over 30 Minutes Intravenous Every 8 hours 04/23/19 1930 04/23/19 1932   04/24/19 0200  cefTAZidime (FORTAZ) 2 g in sodium chloride 0.9 % 100 mL IVPB  Status:  Discontinued     2 g 200 mL/hr over 30 Minutes Intravenous Every 8 hours 04/23/19 1932 04/23/19 1934   04/24/19 0200  ceftAZIDime (FORTAZ) 2 gram/50 mL D5W IVPB - DUPLEX  Status:  Discontinued     2 g 100 mL/hr over 30 Minutes Intravenous Every 8 hours 04/23/19 1935 04/23/19 1948   04/24/19 0200  cefTAZidime (FORTAZ) 2 g in sodium chloride 0.9 % 100 mL IVPB     2 g 200 mL/hr over 30 Minutes Intravenous Every 8 hours 04/23/19 1948     04/21/19 1000  ceftazidime-avibactam (AVYCAZ) 2.5 g in dextrose 5 % 50 mL IVPB  Status:  Discontinued     2.5 g 25 mL/hr over 2 Hours Intravenous Every 8 hours 04/21/19 0828 04/23/19 1925   04/21/19 1000  DAPTOmycin (CUBICIN) 500 mg in sodium chloride  0.9 % IVPB  Status:  Discontinued     500 mg 220 mL/hr over 30 Minutes Intravenous Daily 04/21/19 0902 04/25/19 0752   04/21/19 0830  linezolid (ZYVOX) IVPB 600 mg  Status:  Discontinued     600 mg 300 mL/hr over 60 Minutes Intravenous  Once 04/21/19 0826 04/21/19 0829   04/21/19 0630  piperacillin-tazobactam (ZOSYN) IVPB 3.375 g     3.375 g 100 mL/hr over 30 Minutes Intravenous  Once 04/21/19 6767 04/21/19 2094      Subjective: Patient was seen and examined at bedside, he denies any complaints.  No overnight events.  Objective: Vitals:   04/26/19 1110 04/26/19 1115 04/26/19 1120 04/26/19 1125  BP: 103/79 122/87 106/76 (!) 84/62  Pulse: (!) 104 (!) 101 (!) 105 (!) 106  Resp: 18 13 15 14   Temp:      TempSrc:      SpO2: 100% 100% 100% 100%  Weight:      Height:        Intake/Output Summary (Last 24 hours) at 04/26/2019 1212 Last data filed at 04/26/2019 0959 Gross per 24 hour  Intake 2017.9 ml  Output 1800 ml  Net 217.9 ml   Filed Weights   04/21/19 0444 04/21/19 1130  Weight: 77.7 kg 68.9 kg    Examination:  General exam: Appears calm and comfortable.  Normal affect, frail,  chronically ill-appearing. Respiratory system: Clear to auscultation. Respiratory effort normal. Cardiovascular system: S1 & S2 heard, RRR. No JVD, murmurs, rubs, gallops or clicks. No pedal edema. Gastrointestinal system: Abdomen is nondistended, soft and nontender. No organomegaly or masses felt. Normal bowel sounds heard. Central nervous system: Alert and oriented. No focal neurological deficits. Extremities: No pedal edema, no no calf pain Skin: Patient on multiple pressure ulcers. Psychiatry: Judgement and insight appear normal. Mood & affect appropriate.     Data Reviewed: I have personally reviewed following labs and imaging studies  CBC: Recent Labs  Lab 04/21/19 0346 04/22/19 0737 04/23/19 0445 04/24/19 0540 04/25/19 0500 04/26/19 0520  WBC 15.3* 7.2 8.4 9.7 15.3* 15.0*    NEUTROABS 11.9* 4.5 5.8 6.3 10.6*  --   HGB 8.5* 7.0* 7.1* 7.3* 7.3* 7.1*  HCT 28.2* 23.4* 23.5* 24.5* 24.4* 23.7*  MCV 84.7 86.0 86.7 86.3 86.5 86.5  PLT 464* 353 427* 393 433* 709*   Basic Metabolic Panel: Recent Labs  Lab 04/22/19 0737 04/23/19 0445 04/24/19 0540 04/25/19 0500 04/26/19 0520  NA 141 141 142 139 140  K 3.2* 3.1* 3.4* 3.1* 3.8  CL 111 112* 112* 109 109  CO2 23 21* 25 24 25   GLUCOSE 79 95 83 117* 107*  BUN 13 10 7 6 10   CREATININE <0.30* 0.30* <0.30* <0.30* <0.30*  CALCIUM 7.7* 7.5* 7.4* 7.3* 7.7*   GFR: CrCl cannot be calculated (This lab value cannot be used to calculate CrCl because it is not a number: <0.30). Liver Function Tests: Recent Labs  Lab 04/21/19 0346  AST 11*  ALT 13  ALKPHOS 73  BILITOT 0.4  PROT 6.1*  ALBUMIN 2.0*   No results for input(s): LIPASE, AMYLASE in the last 168 hours. No results for input(s): AMMONIA in the last 168 hours. Coagulation Profile: Recent Labs  Lab  04/21/19 0346  INR 1.1   Cardiac Enzymes: Recent Labs  Lab 04/22/19 0422  CKTOTAL 5*   BNP (last 3 results) No results for input(s): PROBNP in the last 8760 hours. HbA1C: No results for input(s): HGBA1C in the last 72 hours. CBG: Recent Labs  Lab 04/21/19 0302  GLUCAP 145*   Lipid Profile: No results for input(s): CHOL, HDL, LDLCALC, TRIG, CHOLHDL, LDLDIRECT in the last 72 hours. Thyroid Function Tests: No results for input(s): TSH, T4TOTAL, FREET4, T3FREE, THYROIDAB in the last 72 hours. Anemia Panel: No results for input(s): VITAMINB12, FOLATE, FERRITIN, TIBC, IRON, RETICCTPCT in the last 72 hours. Sepsis Labs: Recent Labs  Lab 04/21/19 0346  LATICACIDVEN 1.0    Recent Results (from the past 240 hour(s))  Urine culture     Status: Abnormal   Collection Time: 04/21/19  3:46 AM   Specimen: In/Out Cath Urine  Result Value Ref Range Status   Specimen Description   Final    IN/OUT CATH URINE Performed at [facility],  [place] [address], [place], [place] [address]    Special Requests   Final    NONE Performed at [facility], [place] [address] [place], [place] [address]    Culture MULTIPLE SPECIES PRESENT, SUGGEST RECOLLECTION (A)  Final   Report Status 04/22/2019 FINAL  Final  SARS CORONAVIRUS 2 (TAT 6-24 HRS) Nasopharyngeal Nasopharyngeal Swab     Status: None   Collection Time: 04/21/19  4:10 AM   Specimen: Nasopharyngeal Swab  Result Value Ref Range Status   SARS Coronavirus 2 NEGATIVE NEGATIVE Final    Comment: (NOTE) SARS-CoV-2 target nucleic acids are NOT DETECTED. The SARS-CoV-2 RNA is generally detectable in upper and lower respiratory specimens during the acute phase of infection. Negative results do not preclude SARS-CoV-2 infection, do not rule out co-infections with other pathogens, and should not be used as the sole basis for treatment or other patient management decisions. Negative results must be combined with clinical observations, patient history, and epidemiological information. The expected result is Negative. Fact Sheet for Patients: SugarRoll.be Fact Sheet for Healthcare Providers: https://www.woods-mathews.com/ This test is not yet approved or cleared by the [place] FDA and  has been authorized for detection and/or diagnosis of SARS-CoV-2 by FDA under an Emergency Use Authorization (EUA). This EUA will remain  in effect (meaning this test can be used) for the duration of the COVID-19 declaration under Section 56 4(b)(1) of the Act, 21 U.S.C. section 360bbb-3(b)(1), unless the authorization is terminated or revoked sooner. Performed at [facility], [place] [address] [place], [place] [address]   MRSA PCR Screening     Status: Abnormal   Collection Time: 04/21/19 10:46 PM   Specimen: Nasal Mucosa; Nasopharyngeal  Result Value Ref Range Status   MRSA by PCR POSITIVE (A) NEGATIVE Final    Comment:         The GeneXpert MRSA Assay (FDA approved for NASAL specimens only), is one component of a comprehensive MRSA colonization surveillance program. It is not intended to diagnose MRSA infection nor to guide or monitor treatment for MRSA infections. RESULT CALLED TO, READ BACK BY AND VERIFIED WITH: A,FLORACE AT 7262 ON 04/22/19 BY A,MOHAMED Performed at [place] [address] [place], [place] [address]   Culture, blood (routine x 2)     Status: None (Preliminary result)   Collection Time: 04/22/19  7:12 PM   Specimen: BLOOD RIGHT HAND  Result Value Ref Range Status  Specimen Description   Final    BLOOD RIGHT HAND Performed at [place] [address] [place], [place] [address]    Special Requests   Final    BOTTLES DRAWN AEROBIC ONLY Blood Culture adequate volume Performed at [place] [address], [place], [place] [address]    Culture   Final    NO GROWTH 4 DAYS Performed at [facility], [place] [address] [place], [place] [address]    Report Status PENDING  Incomplete  Culture, blood (routine x 2)     Status: None (Preliminary result)   Collection Time: 04/22/19  8:48 PM   Specimen: BLOOD  Result Value Ref Range Status   Specimen Description   Final    BLOOD BLOOD RIGHT HAND Performed at [place] [address] [place], [place] [address]    Special Requests   Final    BOTTLES DRAWN AEROBIC ONLY Blood Culture results may not be optimal due to an inadequate volume of blood received in culture bottles Performed at [place] [address], [place], [place] [address]    Culture   Final    NO GROWTH 4 DAYS Performed at [facility], [place] [address], [place], [place] [address]    Report Status PENDING  Incomplete     Radiology Studies: No results found.   Scheduled Meds: . acidophilus  1 capsule Oral Daily  . baclofen  20 mg Oral BID  .  Chlorhexidine Gluconate Cloth  6 each Topical Daily  . feeding supplement (PRO-STAT SUGAR FREE 64)  30 mL Oral BID  . ferrous sulfate  325 mg Oral Q breakfast  . fesoterodine  8 mg Oral q morning - 10a  . lactose free nutrition  237 mL Oral TID WC  . magnesium oxide  400 mg Oral Q breakfast  . metoCLOPramide  10 mg Oral TID WC  . midodrine  10 mg Oral TID WC  . multivitamin with minerals  1 tablet Oral Daily  . mupirocin ointment  1 application Nasal BID  . pantoprazole  40 mg Oral BID  . sodium chloride flush  10-40 mL Intracatheter Q12H  . sodium chloride flush  10-40 mL Intracatheter Q12H  . sucralfate  1 g Oral TID WC & HS  . vitamin C  500 mg Oral Daily  . zinc sulfate  220 mg Oral Daily   Continuous Infusions: . ceftAZIDime (FORTAZ) IVPB 2 gram/100 mL NS (Mini-Bag Plus) Stopped (04/26/19 0956)  . lactated ringers 75 mL/hr at 04/26/19 1158     LOS: 5 days    Time spent:     Shawna Clamp, MD [facility]   If 7PM-7AM, please contact night-coverage
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
## 2019-04-26 NOTE — Sedation Documentation (Signed)
Sedation started for port removal.

## 2019-04-26 NOTE — Procedures (Signed)
  Procedure: L arm PICC placement   R Port removal   EBL:   minimal Complications:  none immediate  See full dictation in [employer].  Dillard Cannon MD Main # [phone] Pager  [phone]

## 2019-04-26 NOTE — Progress Notes (Signed)
MEDICATION-RELATED CONSULT NOTE   IR Procedure Consult - Anticoagulant/Antiplatelet PTA/Inpatient Med List Review by Pharmacist    Procedure:   L arm PICC placement                         R Port removal     Completed: 12/11  Post-Procedural bleeding risk per IR MD assessment:  low  Antithrombotic medications on inpatient or PTA profile prior to procedure:    - None  Recommended restart time per IR Post-Procedure Guidelines:     Other considerations:      Plan:        Royetta Asal, PharmD, BCPS 04/26/2019 12:16 PM

## 2019-04-26 NOTE — Progress Notes (Signed)
BP low, [facility] notified.  Bolus given.

## 2019-04-26 NOTE — Progress Notes (Signed)
    [facility] for Infectious Disease   Reason for visit: Follow up on sepsis  Interval History: had port-a-cath removed, picc line placed. Blood cultures remain negative Afebrile, WBC 15.   Day 4 ceftazidime  Physical Exam: Constitutional:  Vitals:   04/26/19 1125 04/26/19 1200  BP: (!) 84/62   Pulse: (!) 106   Resp: 14   Temp:  98.7 F (37.1 C)  SpO2: 100%    patient appears in NAD  Impression: Stable probable urinary infection.  History of Pseudomonas and concern for bacteremia, though blood cultures done after antibiotics.   Port removed, picc placed.   Plan: 1.  Continue with ceftazidime for 10 days from today's port removal 2.  Can pull the picc line after treatment completion.   3.  New port can be placed after treatment completion if indicated.    I will sign off

## 2019-04-27 LAB — COMPREHENSIVE METABOLIC PANEL
ALT: 11 U/L (ref 0–44)
AST: 15 U/L (ref 15–41)
Albumin: 1.4 g/dL — ABNORMAL LOW (ref 3.5–5.0)
Alkaline Phosphatase: 95 U/L (ref 38–126)
Anion gap: 8 (ref 5–15)
BUN: 8 mg/dL (ref 6–20)
CO2: 26 mmol/L (ref 22–32)
Calcium: 8.1 mg/dL — ABNORMAL LOW (ref 8.9–10.3)
Chloride: 107 mmol/L (ref 98–111)
Creatinine, Ser: <0.3 mg/dL — ABNORMAL LOW (ref 0.61–1.24)
Glucose, Bld: 105 mg/dL — ABNORMAL HIGH (ref 70–99)
Potassium: 3.2 mmol/L — ABNORMAL LOW (ref 3.5–5.1)
Sodium: 141 mmol/L (ref 135–145)
Total Bilirubin: 0.3 mg/dL (ref 0.3–1.2)
Total Protein: 4.9 g/dL — ABNORMAL LOW (ref 6.5–8.1)

## 2019-04-27 LAB — CBC
HCT: 24.3 % — ABNORMAL LOW (ref 39.0–52.0)
Hemoglobin: 7.2 g/dL — ABNORMAL LOW (ref 13.0–17.0)
MCH: 26.4 pg (ref 26.0–34.0)
MCHC: 29.6 g/dL — ABNORMAL LOW (ref 30.0–36.0)
MCV: 89 fL (ref 80.0–100.0)
Platelets: 487 10*3/uL — ABNORMAL HIGH (ref 150–400)
RBC: 2.73 MIL/uL — ABNORMAL LOW (ref 4.22–5.81)
RDW: 17.3 % — ABNORMAL HIGH (ref 11.5–15.5)
WBC: 16.6 10*3/uL — ABNORMAL HIGH (ref 4.0–10.5)
nRBC: 0 % (ref 0.0–0.2)

## 2019-04-27 LAB — CULTURE, BLOOD (ROUTINE X 2)
Culture: NO GROWTH
Culture: NO GROWTH
Special Requests: ADEQUATE

## 2019-04-27 LAB — URINE CULTURE: Culture: <10000 — AB

## 2019-04-27 LAB — PHOSPHORUS: Phosphorus: 2.7 mg/dL (ref 2.5–4.6)

## 2019-04-27 LAB — MAGNESIUM: Magnesium: 1 mg/dL — ABNORMAL LOW (ref 1.7–2.4)

## 2019-04-27 MED ORDER — PROMETHAZINE HCL 25 MG/ML IJ SOLN
12.5000 mg | Freq: Four times a day (QID) | INTRAMUSCULAR | Status: AC | PRN
  Administered 2019-04-27 – 2019-04-28 (×2): 12.5 mg via INTRAVENOUS
  Filled 2019-04-27 (×2): qty 1

## 2019-04-27 MED ORDER — POTASSIUM CHLORIDE 20 MEQ PO PACK
40.0000 meq | PACK | Freq: Two times a day (BID) | ORAL | Status: AC
  Administered 2019-04-27 (×2): 40 meq via ORAL
  Filled 2019-04-27 (×2): qty 2

## 2019-04-27 MED ORDER — MAGNESIUM SULFATE 2 GM/50ML IV SOLN
2.0000 g | Freq: Once | INTRAVENOUS | Status: AC
  Administered 2019-04-27: 2 g via INTRAVENOUS
  Filled 2019-04-27: qty 50

## 2019-04-27 MED ORDER — SODIUM CHLORIDE 0.9 % IV BOLUS
250.0000 mL | Freq: Once | INTRAVENOUS | Status: AC
  Administered 2019-04-27: 08:00:00 250 mL via INTRAVENOUS

## 2019-04-27 NOTE — Progress Notes (Signed)
Pt states he is comfortable in current position, contracted to the right. He refuses turns. Pillow between kness and under hip. Protective foams on heels.

## 2019-04-27 NOTE — Progress Notes (Signed)
PROGRESS NOTE    [name] [name]  JGG:[id number] DOB: November 06, 1965 DOA: 04/21/2019 PCP: [name] Frames, PA-C   Brief Narrative:  [name] Sahni Dixonis a 53 y.o.malewith medical history significant ofquadriplegia due to a previous C5 fracture, history of decubitus ulcers, recurrent UTIs, colostomy/suprapubic catheter, chronic hypotension. Patient presented to the ED on 12/6 with complaint of nausea and vomiting. Patient feels similarly as he did with his prior UTI. No fever, had some chills. In the ED, patient had a temp of 99.2, he was tachycardic to 120s, hypotensive to 70s, breathing comfortably on room air.   Work-up showed WBC count of 15, potassium 2.9, normal renal function. Patient was admitted to hospitalist medicine service for sepsis secondary to UTI.  Assessment & Plan:   Active Problems:   Sepsis ([place])  Sepsis due to UTI caused by presence of suprapubic catheter. -Met sepsis criteria on admission with elevated white count, hypotension, significant tachycardia -Patient has history of UTI with with multiple drug-resistant bacteria's including VRE, ESBL E. coli as well as Carbapenem resistant Pseudomonas -Antibiotics management per ID, switched from [place] to ceftazidime . -As well there is a concern about bacteremia, blood cultures were done this admission after he received IV antibiotics, recommendation is to remove Port-A-Cath given he did have bacteremia on previous admissions less than 3 weeks. - Consulted to remove Port-A-Cath, patient currently has midline. -With IV fluids, antibiotics, sepsis parameters improving.  No fever, WBC count improving. -Blood cultures done 12/7, with no growth to date . -Port-A-Cath removed yestyerday, PICC line inserted. -ID recommends IV ceftazidime 10 days after the port is removed.  Nausea/vomiting -Resolved, continue with as needed meds.  Chronic hypotension -Worsened with sepsis, he is chronically on midodrine, he is asymptomatic  with these episodes . -With low cortisol level at 6.5 , initially, cosyntropin test has been performed with good response , indication to start on Solu-Cortef . -Continue IV fluid  Sacral pressure ulcer with chronic osteomyelitis -Recently hospitalized for this,  found to be bacteremic with Acinetobacter. - continue with wound care.-  -Plastic surgery consult appreciated,  suggest patient might require debridement of multiple wounds and placement of ACell next week.  Hypokalemia - Resolved. -Potassium 3.2 today, repleted.  Hypomagnesemia: Magnesium sulfate ordered.  Recheck labs tomorrow  Anemia of chronic disease -Continue iron supplementation, transfuse for hemoglobin less than 7.  Quadriplegia -Continue home medications   DVT prophylaxis: Lovenox Code Status: Full  Family Communication: D/W patient Disposition Plan:  2-3 days. Consultants:   Infectious disease  Procedures:  Antimicrobials:  Anti-infectives (From admission, onward)   Start     Dose/Rate Route Frequency Ordered Stop   04/24/19 0200  cefTAZidime (FORTAZ) 1 g in sodium chloride 0.9 % 100 mL IVPB  Status:  Discontinued     1 g 200 mL/hr over 30 Minutes Intravenous Every 8 hours 04/23/19 1930 04/23/19 1932   04/24/19 0200  cefTAZidime (FORTAZ) 2 g in sodium chloride 0.9 % 100 mL IVPB  Status:  Discontinued     2 g 200 mL/hr over 30 Minutes Intravenous Every 8 hours 04/23/19 1932 04/23/19 1934   04/24/19 0200  ceftAZIDime (FORTAZ) 2 gram/50 mL D5W IVPB - DUPLEX  Status:  Discontinued     2 g 100 mL/hr over 30 Minutes Intravenous Every 8 hours 04/23/19 1935 04/23/19 1948   04/24/19 0200  cefTAZidime (FORTAZ) 2 g in sodium chloride 0.9 % 100 mL IVPB     2 g 200 mL/hr over 30 Minutes Intravenous Every 8  hours 04/23/19 1948     04/21/19 1000  ceftazidime-avibactam (AVYCAZ) 2.5 g in dextrose 5 % 50 mL IVPB  Status:  Discontinued     2.5 g 25 mL/hr over 2 Hours Intravenous Every 8 hours 04/21/19 0828  04/23/19 1925   04/21/19 1000  DAPTOmycin (CUBICIN) 500 mg in sodium chloride 0.9 % IVPB  Status:  Discontinued     500 mg 220 mL/hr over 30 Minutes Intravenous Daily 04/21/19 0902 04/25/19 0752   04/21/19 0830  linezolid (ZYVOX) IVPB 600 mg  Status:  Discontinued     600 mg 300 mL/hr over 60 Minutes Intravenous  Once 04/21/19 0826 04/21/19 0829   04/21/19 0630  piperacillin-tazobactam (ZOSYN) IVPB 3.375 g     3.375 g 100 mL/hr over 30 Minutes Intravenous  Once 04/21/19 6599 04/21/19 0823      Subjective: Patient was seen and examined at bedside, blood pressure has been running low, he was given bolus few times.  He denies any symptoms.  states his blood pressure always runs low.  Objective: Vitals:   04/27/19 1002 04/27/19 1030 04/27/19 1100 04/27/19 1130  BP: (!) 79/46 (!) 93/50 (!) 93/47 (!) 86/54  Pulse: (!) 101 (!) 101 (!) 108 (!) 107  Resp: 13 16 (!) 22 14  Temp:      TempSrc:      SpO2: 100% 98% 100% 99%  Weight:      Height:        Intake/Output Summary (Last 24 hours) at 04/27/2019 1248 Last data filed at 04/27/2019 1100 Gross per 24 hour  Intake 3172.33 ml  Output 1450 ml  Net 1722.33 ml   Filed Weights   04/21/19 0444 04/21/19 1130  Weight: 77.7 kg 68.9 kg    Examination:  General exam: Appears calm and comfortable.  Normal affect, frail,  chronically ill-appearing. Respiratory system: Clear to auscultation. Respiratory effort normal. Cardiovascular system: S1 & S2 heard, RRR. No JVD, murmurs, rubs, gallops or clicks. No pedal edema. Gastrointestinal system: Abdomen is nondistended, soft and nontender. No organomegaly or masses felt. Normal bowel sounds heard. Central nervous system: Alert and oriented. No focal neurological deficits. Extremities: No pedal edema, no no calf pain Skin: Patient on multiple pressure ulcers. Psychiatry: Judgement and insight appear normal. Mood & affect appropriate.     Data Reviewed: I have personally reviewed following  labs and imaging studies  CBC: Recent Labs  Lab 04/21/19 0346 04/22/19 0737 04/23/19 0445 04/24/19 0540 04/25/19 0500 04/26/19 0520  WBC 15.3* 7.2 8.4 9.7 15.3* 15.0*  NEUTROABS 11.9* 4.5 5.8 6.3 10.6*  --   HGB 8.5* 7.0* 7.1* 7.3* 7.3* 7.1*  HCT 28.2* 23.4* 23.5* 24.5* 24.4* 23.7*  MCV 84.7 86.0 86.7 86.3 86.5 86.5  PLT 464* 353 427* 393 433* 357*   Basic Metabolic Panel: Recent Labs  Lab 04/23/19 0445 04/24/19 0540 04/25/19 0500 04/26/19 0520 04/27/19 0519  NA 141 142 139 140 141  K 3.1* 3.4* 3.1* 3.8 3.2*  CL 112* 112* 109 109 107  CO2 21* 25 24 25 26   GLUCOSE 95 83 117* 107* 105*  BUN 10 7 6 10 8   CREATININE 0.30* <0.30* <0.30* <0.30* <0.30*  CALCIUM 7.5* 7.4* 7.3* 7.7* 8.1*  MG  --   --   --   --  1.0*  PHOS  --   --   --   --  2.7   GFR: CrCl cannot be calculated (This lab value cannot be used to calculate CrCl because it is  not a number: <0.30). Liver Function Tests: Recent Labs  Lab 04/21/19 0346 04/27/19 0519  AST 11* 15  ALT 13 11  ALKPHOS 73 95  BILITOT 0.4 0.3  PROT 6.1* 4.9*  ALBUMIN 2.0* 1.4*   No results for input(s): LIPASE, AMYLASE in the last 168 hours. No results for input(s): AMMONIA in the last 168 hours. Coagulation Profile: Recent Labs  Lab 04/21/19 0346  INR 1.1   Cardiac Enzymes: Recent Labs  Lab 04/22/19 0422  CKTOTAL 5*   BNP (last 3 results) No results for input(s): PROBNP in the last 8760 hours. HbA1C: No results for input(s): HGBA1C in the last 72 hours. CBG: Recent Labs  Lab 04/21/19 0302  GLUCAP 145*   Lipid Profile: No results for input(s): CHOL, HDL, LDLCALC, TRIG, CHOLHDL, LDLDIRECT in the last 72 hours. Thyroid Function Tests: No results for input(s): TSH, T4TOTAL, FREET4, T3FREE, THYROIDAB in the last 72 hours. Anemia Panel: No results for input(s): VITAMINB12, FOLATE, FERRITIN, TIBC, IRON, RETICCTPCT in the last 72 hours. Sepsis Labs: Recent Labs  Lab 04/21/19 0346  LATICACIDVEN 1.0    Recent  Results (from the past 240 hour(s))  Urine culture     Status: Abnormal   Collection Time: 04/21/19  3:46 AM   Specimen: In/Out Cath Urine  Result Value Ref Range Status   Specimen Description   Final    IN/OUT CATH URINE Performed at [facility], [place] [address], [place], [place] [address]    Special Requests   Final    NONE Performed at [facility], [place] [address], [place], [place] [address]    Culture MULTIPLE SPECIES PRESENT, SUGGEST RECOLLECTION (A)  Final   Report Status 04/22/2019 FINAL  Final  SARS CORONAVIRUS 2 (TAT 6-24 HRS) Nasopharyngeal Nasopharyngeal Swab     Status: None   Collection Time: 04/21/19  4:10 AM   Specimen: Nasopharyngeal Swab  Result Value Ref Range Status   SARS Coronavirus 2 NEGATIVE NEGATIVE Final    Comment: (NOTE) SARS-CoV-2 target nucleic acids are NOT DETECTED. The SARS-CoV-2 RNA is generally detectable in upper and lower respiratory specimens during the acute phase of infection. Negative results do not preclude SARS-CoV-2 infection, do not rule out co-infections with other pathogens, and should not be used as the sole basis for treatment or other patient management decisions. Negative results must be combined with clinical observations, patient history, and epidemiological information. The expected result is Negative. Fact Sheet for Patients: SugarRoll.be Fact Sheet for Healthcare Providers: https://www.woods-mathews.com/ This test is not yet approved or cleared by the [place] FDA and  has been authorized for detection and/or diagnosis of SARS-CoV-2 by FDA under an Emergency Use Authorization (EUA). This EUA will remain  in effect (meaning this test can be used) for the duration of the COVID-19 declaration under Section 56 4(b)(1) of the Act, 21 U.S.C. section 360bbb-3(b)(1), unless the authorization is terminated or revoked sooner. Performed at [facility], [place] [address] [place], [place] [address]   MRSA PCR Screening     Status: Abnormal   Collection Time: 04/21/19 10:46 PM   Specimen: Nasal Mucosa; Nasopharyngeal  Result Value Ref Range Status   MRSA by PCR POSITIVE (A) NEGATIVE Final    Comment:        The GeneXpert MRSA Assay (FDA approved for NASAL specimens only), is one component of a comprehensive MRSA colonization surveillance program. It is not intended to diagnose MRSA infection nor to guide or monitor treatment  for MRSA infections. RESULT CALLED TO, READ BACK BY AND VERIFIED WITH: A,FLORACE AT 3818 ON 04/22/19 BY A,MOHAMED Performed at [facility] [address], [place], [place] [address]   Culture, blood (routine x 2)     Status: None   Collection Time: 04/22/19  7:12 PM   Specimen: BLOOD RIGHT HAND  Result Value Ref Range Status   Specimen Description   Final    BLOOD RIGHT HAND Performed at [facility] [address] [place], [place] [address]    Special Requests   Final    BOTTLES DRAWN AEROBIC ONLY Blood Culture adequate volume Performed at [facility] [address] [place], [place] [address]    Culture   Final    NO GROWTH 5 DAYS Performed at [facility], [place] [address], [place], [place] [address]    Report Status 04/27/2019 FINAL  Final  Culture, blood (routine x 2)     Status: None   Collection Time: 04/22/19  8:48 PM   Specimen: BLOOD  Result Value Ref Range Status   Specimen Description   Final    BLOOD BLOOD RIGHT HAND Performed at [facility] [address] [place], [place] [address]    Special Requests   Final    BOTTLES DRAWN AEROBIC ONLY Blood Culture results may not be optimal due to an inadequate volume of blood received in culture bottles Performed at [facility] [address], [place], [place] [address]    Culture   Final    NO GROWTH 5 DAYS Performed at [facility], [place] [address] [place], [place] [address]    Report Status 04/27/2019 FINAL  Final  Culture, Urine     Status: Abnormal   Collection Time: 04/26/19  7:35 AM   Specimen: Urine, Catheterized  Result Value Ref Range Status   Specimen Description   Final    URINE, CATHETERIZED Performed at [facility] [address] [place], [place] [address]    Special Requests   Final    NONE Performed at [facility], [place] [address], [place], [place] [address]    Culture (A)  Final    <10,000 COLONIES/mL INSIGNIFICANT GROWTH Performed at [facility] [address] [place], [place] [address]    Report Status 04/27/2019 FINAL  Final     Radiology Studies: IR REMOVAL TUN ACCESS W/ PORT W/O FL MOD SED  Result Date: 04/26/2019 CLINICAL DATA:  Port catheter that I placed 07/13/2016, has worked well without complication. Recent suspected bloodstream infection, prophylactic central line removal requested. EXAM: EXAM TUNNELED PORT CATHETER REMOVAL TECHNIQUE: The procedure, risks (including but not limited to bleeding, infection, organ damage ), benefits, and alternatives were explained to the patient. Questions regarding the procedure were encouraged and answered. The patient understands and consents to the procedure. Intravenous Fentanyl 77mg and Versed 170mwere administered as conscious sedation during continuous monitoring of the patient's level of consciousness and physiological / cardiorespiratory status by the radiology RN, with a total moderate sedation time of 32 minutes. Overlying skin prepped with chlorhexidine, draped in usual sterile fashion, infiltrated locally with 1% lidocaine. A small incision was made over the scar from previous placement. The port catheter was dissected free from the underlying soft tissues and removed intact. Hemostasis was achieved. The port pocket was closed with deep interrupted and subcuticular continuous 3-0  Monocryl sutures, then covered with Dermabond.  The patient tolerated the procedure well. COMPLICATIONS: COMPLICATIONS None immediate IMPRESSION: 1.  Technically successful tunneled Port catheter removal. Electronically Signed   By: Lucrezia Europe M.D.   On: 04/26/2019 14:49   [place] PLACEMENT LEFT >5 YRS INC IMG GUIDE  Result Date: 04/26/2019 CLINICAL DATA:  Suspected bloodstream infection, port removal requested. Needs durable venous access for IV therapies. EXAM: PICC PLACEMENT WITH ULTRASOUND AND FLUOROSCOPY FLUOROSCOPY TIME:  0.6 minute; 184 uGym2 DAP TECHNIQUE: After written informed consent was obtained, patient was placed in the supine position on angiographic table. Patency of the left basilic vein was confirmed with ultrasound with image documentation. An appropriate skin site was determined. Skin site was marked. Region was prepped using maximum barrier technique including cap and mask, sterile gown, sterile gloves, large sterile sheet, and Chlorhexidine as cutaneous antisepsis. The region was infiltrated locally with 1% lidocaine. Under real-time ultrasound guidance, the left basilic vein was accessed with a 21 gauge micropuncture needle; the needle tip within the vein was confirmed with ultrasound image documentation. Needle exchanged over a 018 guidewire for a peel-away sheath, through which a 5-French double-lumen power injectable PICC trimmed to 46cm was advanced, positioned with its tip near the cavoatrial junction. Spot chest radiograph confirms appropriate catheter position. Catheter was flushed per protocol and secured externally. The patient tolerated procedure well. COMPLICATIONS: COMPLICATIONS none IMPRESSION: 1. Technically successful five [place] double lumen power injectable PICC placement Electronically Signed   By: Lucrezia Europe M.D.   On: 04/26/2019 14:50     Scheduled Meds: . acidophilus  1 capsule Oral Daily  . baclofen  20 mg Oral BID  . Chlorhexidine Gluconate Cloth  6 each  Topical Daily  . feeding supplement (PRO-STAT SUGAR FREE 64)  30 mL Oral BID  . ferrous sulfate  325 mg Oral Q breakfast  . fesoterodine  8 mg Oral q morning - 10a  . lactose free nutrition  237 mL Oral TID WC  . magnesium oxide  400 mg Oral Q breakfast  . metoCLOPramide  10 mg Oral TID WC  . midodrine  10 mg Oral TID WC  . multivitamin with minerals  1 tablet Oral Daily  . pantoprazole  40 mg Oral BID  . potassium chloride  40 mEq Oral BID  . sodium chloride flush  10-40 mL Intracatheter Q12H  . sodium chloride flush  10-40 mL Intracatheter Q12H  . sucralfate  1 g Oral TID WC & HS  . vitamin C  500 mg Oral Daily  . zinc sulfate  220 mg Oral Daily   Continuous Infusions: . ceftAZIDime (FORTAZ) IVPB 2 gram/100 mL NS (Mini-Bag Plus) Stopped (04/27/19 1045)  . lactated ringers 75 mL/hr at 04/27/19 1100     LOS: 6 days    Time spent:     Shawna Clamp, MD [facility]   If 7PM-7AM, please contact night-coverage

## 2019-04-28 ENCOUNTER — Inpatient Hospital Stay: Payer: Self-pay

## 2019-04-28 ENCOUNTER — Inpatient Hospital Stay (HOSPITAL_COMMUNITY): Payer: Medicare Other

## 2019-04-28 DIAGNOSIS — N390 Urinary tract infection, site not specified: Secondary | ICD-10-CM

## 2019-04-28 DIAGNOSIS — R7881 Bacteremia: Secondary | ICD-10-CM

## 2019-04-28 DIAGNOSIS — M7989 Other specified soft tissue disorders: Secondary | ICD-10-CM

## 2019-04-28 DIAGNOSIS — R609 Edema, unspecified: Secondary | ICD-10-CM

## 2019-04-28 LAB — BASIC METABOLIC PANEL
Anion gap: 7 (ref 5–15)
BUN: 8 mg/dL (ref 6–20)
CO2: 25 mmol/L (ref 22–32)
Calcium: 7.9 mg/dL — ABNORMAL LOW (ref 8.9–10.3)
Chloride: 106 mmol/L (ref 98–111)
Creatinine, Ser: <0.3 mg/dL — ABNORMAL LOW (ref 0.61–1.24)
Glucose, Bld: 110 mg/dL — ABNORMAL HIGH (ref 70–99)
Potassium: 4.7 mmol/L (ref 3.5–5.1)
Sodium: 138 mmol/L (ref 135–145)

## 2019-04-28 LAB — CBC
HCT: 22.7 % — ABNORMAL LOW (ref 39.0–52.0)
Hemoglobin: 6.9 g/dL — CL (ref 13.0–17.0)
MCH: 25.6 pg — ABNORMAL LOW (ref 26.0–34.0)
MCHC: 30.4 g/dL (ref 30.0–36.0)
MCV: 84.1 fL (ref 80.0–100.0)
Platelets: 298 10*3/uL (ref 150–400)
RBC: 2.7 MIL/uL — ABNORMAL LOW (ref 4.22–5.81)
RDW: 17.3 % — ABNORMAL HIGH (ref 11.5–15.5)
WBC: 14.6 10*3/uL — ABNORMAL HIGH (ref 4.0–10.5)
nRBC: 0.2 % (ref 0.0–0.2)

## 2019-04-28 LAB — PREPARE RBC (CROSSMATCH)

## 2019-04-28 LAB — PHOSPHORUS: Phosphorus: 2.6 mg/dL (ref 2.5–4.6)

## 2019-04-28 LAB — MAGNESIUM: Magnesium: 1.2 mg/dL — ABNORMAL LOW (ref 1.7–2.4)

## 2019-04-28 MED ORDER — POTASSIUM CHLORIDE 20 MEQ PO PACK: 40.0000 meq | PACK | Freq: Once | ORAL | Status: DC

## 2019-04-28 MED ORDER — SODIUM CHLORIDE 0.9% FLUSH: 10.0000 mL | INTRAVENOUS | Status: DC | PRN

## 2019-04-28 MED ORDER — MAGNESIUM SULFATE 2 GM/50ML IV SOLN: 2.0000 g | Freq: Once | INTRAVENOUS | Status: DC

## 2019-04-28 MED ORDER — SODIUM CHLORIDE 0.9% IV SOLUTION: Freq: Once | INTRAVENOUS | Status: DC

## 2019-04-28 MED ORDER — MAGNESIUM SULFATE 2 GM/50ML IV SOLN
2.0000 g | Freq: Once | INTRAVENOUS | Status: AC
  Administered 2019-04-28: 2 g via INTRAVENOUS
  Filled 2019-04-28: qty 50

## 2019-04-28 MED ORDER — ENOXAPARIN SODIUM 40 MG/0.4ML ~~LOC~~ SOLN
40.0000 mg | Freq: Every day | SUBCUTANEOUS | Status: DC
  Administered 2019-04-28 – 2019-05-06 (×9): 40 mg via SUBCUTANEOUS
  Filled 2019-04-28 (×9): qty 0.4

## 2019-04-28 NOTE — Progress Notes (Signed)
PROGRESS NOTE    [name] [name]  ZOX:[id number] DOB: May 11, 1966 DOA: 04/21/2019 PCP: Andria Frames, PA-C   Brief Narrative:  [name] Lightsey Dixonis a 53 y.o.malewith medical history significant ofquadriplegia due to a previous C5 fracture, history of decubitus ulcers, recurrent UTIs, colostomy/suprapubic catheter, chronic hypotension. Patient presented to the ED on 12/6 with complaint of nausea and vomiting. Patient feels similarly as he did with his prior UTI. No fever, had some chills. In the ED, patient had a temp of 99.2, he was tachycardic to 120s, hypotensive to 70s, breathing comfortably on room air.   Work-up showed WBC count of 15, potassium 2.9, normal renal function. Patient was admitted to hospitalist medicine service for sepsis secondary to UTI.  Assessment & Plan:   Active Problems:   Sepsis ([place])  Sepsis due to UTI caused by presence of suprapubic catheter. -Met sepsis criteria on admission with elevated white count, hypotension, significant tachycardia -Patient has history of UTI with with multiple drug-resistant bacteria's including VRE, ESBL E. coli as well as Carbapenem resistant Pseudomonas -Antibiotics management per ID, switched from [place] to ceftazidime . -As well there is a concern about bacteremia, blood cultures were done this admission after he received IV antibiotics, recommendation is to remove Port-A-Cath given he did have bacteremia on previous admissions less than 3 weeks. - Consulted to remove Port-A-Cath, patient currently has midline. -With IV fluids, antibiotics, sepsis parameters improving.  No fever, WBC count improving. -Blood cultures done 12/7, with no growth to date . -Port-A-Cath removed yestyerday, PICC line inserted 12/11. Left arm swollen, PICC line removed.12/12 -ID recommends IV ceftazidime 10 days after the port is removed. - IV Nurse unsuccessful to put PICC line, IR not available to put PICC line today. - Requested ICU to put central  line for iv access. - He eventually needs PICC line for antibiotics course.  Nausea/vomiting -Resolved, continue with as needed meds.  Chronic hypotension -Worsened with sepsis, he is chronically on midodrine, he is asymptomatic with these episodes . -With low cortisol level at 6.5 , initially, cosyntropin test has been performed with good response , indication to start on Solu-Cortef . -Continue IV fluid  Sacral pressure ulcer with chronic osteomyelitis -Recently hospitalized for this,  found to be bacteremic with Acinetobacter. - continue with wound care.-  -Plastic surgery consult appreciated,  suggest patient might require debridement of multiple wounds and placement of ACell next week.  Hypokalemia - Resolved. -Potassium 4.7, repleted.  Hypomagnesemia: Magnesium sulfate ordered.  Recheck labs tomorrow  Anemia of chronic disease -Continue iron supplementation, transfuse for hemoglobin less than 7.0 - His hb 6.9, he might require blood transfusion once have iv access.   Quadriplegia -Continue home medications   DVT prophylaxis: Lovenox Code Status: Full  Family Communication: D/W patient Disposition Plan:  2-3 days. Consultants:   Infectious disease  Procedures:  Antimicrobials:  Anti-infectives (From admission, onward)   Start     Dose/Rate Route Frequency Ordered Stop   04/24/19 0200  cefTAZidime (FORTAZ) 1 g in sodium chloride 0.9 % 100 mL IVPB  Status:  Discontinued     1 g 200 mL/hr over 30 Minutes Intravenous Every 8 hours 04/23/19 1930 04/23/19 1932   04/24/19 0200  cefTAZidime (FORTAZ) 2 g in sodium chloride 0.9 % 100 mL IVPB  Status:  Discontinued     2 g 200 mL/hr over 30 Minutes Intravenous Every 8 hours 04/23/19 1932 04/23/19 1934   04/24/19 0200  ceftAZIDime (FORTAZ) 2 gram/50 mL D5W IVPB -  DUPLEX  Status:  Discontinued     2 g 100 mL/hr over 30 Minutes Intravenous Every 8 hours 04/23/19 1935 04/23/19 1948   04/24/19 0200  cefTAZidime (FORTAZ)  2 g in sodium chloride 0.9 % 100 mL IVPB     2 g 200 mL/hr over 30 Minutes Intravenous Every 8 hours 04/23/19 1948     04/21/19 1000  ceftazidime-avibactam (AVYCAZ) 2.5 g in dextrose 5 % 50 mL IVPB  Status:  Discontinued     2.5 g 25 mL/hr over 2 Hours Intravenous Every 8 hours 04/21/19 0828 04/23/19 1925   04/21/19 1000  DAPTOmycin (CUBICIN) 500 mg in sodium chloride 0.9 % IVPB  Status:  Discontinued     500 mg 220 mL/hr over 30 Minutes Intravenous Daily 04/21/19 0902 04/25/19 0752   04/21/19 0830  linezolid (ZYVOX) IVPB 600 mg  Status:  Discontinued     600 mg 300 mL/hr over 60 Minutes Intravenous  Once 04/21/19 0826 04/21/19 0829   04/21/19 0630  piperacillin-tazobactam (ZOSYN) IVPB 3.375 g     3.375 g 100 mL/hr over 30 Minutes Intravenous  Once 04/21/19 0814 04/21/19 0823      Subjective: Patient was seen and examined at bedside, blood pressure has been running low,  He denies any symptoms.  states his blood pressure always runs low.  Objective: Vitals:   04/28/19 0900 04/28/19 1000 04/28/19 1100 04/28/19 1200  BP: 105/77 (!) 106/58 (!) 99/55 (!) 88/52  Pulse: 99 (!) 110 (!) 110 (!) 108  Resp: 18 (!) _0 Temp:    98 F (36.7 C)  TempSrc:      SpO2: 100% 100% 100% 100%  Weight:      Height:        Intake/Output Summary (Last 24 hours) at 04/28/2019 1308 Last data filed at 04/28/2019 1000 Gross per 24 hour  Intake 1555.84 ml  Output 4500 ml  Net -2944.16 ml   Filed Weights   04/21/19 0444 04/21/19 1130  Weight: 77.7 kg 68.9 kg    Examination:  General exam: Appears calm and comfortable.  Normal affect, frail,  chronically ill-appearing. Respiratory system: Clear to auscultation. Respiratory effort normal. Cardiovascular system: S1 & S2 heard, RRR. No JVD, murmurs, rubs, gallops or clicks. No pedal edema. Gastrointestinal system: Abdomen is nondistended, soft and nontender. No organomegaly or masses felt. Normal bowel sounds heard. Central nervous system:  Alert and oriented. No focal neurological deficits. Extremities: No pedal edema, no calf pain, Left arm swollen , non tender. Skin: Patient on multiple pressure ulcers. Psychiatry: Judgement and insight appear normal. Mood & affect appropriate.     Data Reviewed: I have personally reviewed following labs and imaging studies  CBC: Recent Labs  Lab 04/22/19 0737 04/23/19 0445 04/24/19 0540 04/25/19 0500 04/26/19 0520 04/27/19 0519 04/28/19 1055  WBC 7.2 8.4 9.7 15.3* 15.0* 16.6* 14.6*  NEUTROABS 4.5 5.8 6.3 10.6*  --   --   --   HGB 7.0* 7.1* 7.3* 7.3* 7.1* 7.2* 6.9*  HCT 23.4* 23.5* 24.5* 24.4* 23.7* 24.3* 22.7*  MCV 86.0 86.7 86.3 86.5 86.5 89.0 84.1  PLT 353 427* 393 433* 473* 487* 481   Basic Metabolic Panel: Recent Labs  Lab 04/24/19 0540 04/25/19 0500 04/26/19 0520 04/27/19 0519 04/28/19 1055  NA 142 139 140 141 138  K 3.4* 3.1* 3.8 3.2* 4.7  CL 112* 109 109 107 106  CO2 _1 GLUCOSE 83 117* 107* 105* 110*  BUN 7  _0 CREATININE <0.30* <0.30* <0.30* <0.30* <0.30*  CALCIUM 7.4* 7.3* 7.7* 8.1* 7.9*  MG  --   --   --  1.0* 1.2*  PHOS  --   --   --  2.7 2.6   GFR: CrCl cannot be calculated (This lab value cannot be used to calculate CrCl because it is not a number: <0.30). Liver Function Tests: Recent Labs  Lab 04/27/19 0519  AST 15  ALT 11  ALKPHOS 95  BILITOT 0.3  PROT 4.9*  ALBUMIN 1.4*   No results for input(s): LIPASE, AMYLASE in the last 168 hours. No results for input(s): AMMONIA in the last 168 hours. Coagulation Profile: No results for input(s): INR, PROTIME in the last 168 hours. Cardiac Enzymes: Recent Labs  Lab 04/22/19 0422  CKTOTAL 5*   BNP (last 3 results) No results for input(s): PROBNP in the last 8760 hours. HbA1C: No results for input(s): HGBA1C in the last 72 hours. CBG: No results for input(s): GLUCAP in the last 168 hours. Lipid Profile: No results for input(s): CHOL, HDL, LDLCALC, TRIG, CHOLHDL,  LDLDIRECT in the last 72 hours. Thyroid Function Tests: No results for input(s): TSH, T4TOTAL, FREET4, T3FREE, THYROIDAB in the last 72 hours. Anemia Panel: No results for input(s): VITAMINB12, FOLATE, FERRITIN, TIBC, IRON, RETICCTPCT in the last 72 hours. Sepsis Labs: No results for input(s): PROCALCITON, LATICACIDVEN in the last 168 hours.  Recent Results (from the past 240 hour(s))  Urine culture     Status: Abnormal   Collection Time: 04/21/19  3:46 AM   Specimen: In/Out Cath Urine  Result Value Ref Range Status   Specimen Description   Final    IN/OUT CATH URINE Performed at [facility] [address], [place], [place] [address]    Special Requests   Final    NONE Performed at [facility], [place] [address] [place], [place] [address]    Culture MULTIPLE SPECIES PRESENT, SUGGEST RECOLLECTION (A)  Final   Report Status 04/22/2019 FINAL  Final  SARS CORONAVIRUS 2 (TAT 6-24 HRS) Nasopharyngeal Nasopharyngeal Swab     Status: None   Collection Time: 04/21/19  4:10 AM   Specimen: Nasopharyngeal Swab  Result Value Ref Range Status   SARS Coronavirus 2 NEGATIVE NEGATIVE Final    Comment: (NOTE) SARS-CoV-2 target nucleic acids are NOT DETECTED. The SARS-CoV-2 RNA is generally detectable in upper and lower respiratory specimens during the acute phase of infection. Negative results do not preclude SARS-CoV-2 infection, do not rule out co-infections with other pathogens, and should not be used as the sole basis for treatment or other patient management decisions. Negative results must be combined with clinical observations, patient history, and epidemiological information. The expected result is Negative. Fact Sheet for Patients: SugarRoll.be Fact Sheet for Healthcare Providers: https://www.woods-mathews.com/ This test is not yet approved or cleared by the [place] FDA and  has been authorized for  detection and/or diagnosis of SARS-CoV-2 by FDA under an Emergency Use Authorization (EUA). This EUA will remain  in effect (meaning this test can be used) for the duration of the COVID-19 declaration under Section 56 4(b)(1) of the Act, 21 U.S.C. section 360bbb-3(b)(1), unless the authorization is terminated or revoked sooner. Performed at [facility], [place] [address] [place], [place] [address]   MRSA PCR Screening     Status: Abnormal   Collection Time: 04/21/19 10:46 PM   Specimen: Nasal Mucosa; Nasopharyngeal  Result Value Ref Range Status  MRSA by PCR POSITIVE (A) NEGATIVE Final    Comment:        The GeneXpert MRSA Assay (FDA approved for NASAL specimens only), is one component of a comprehensive MRSA colonization surveillance program. It is not intended to diagnose MRSA infection nor to guide or monitor treatment for MRSA infections. RESULT CALLED TO, READ BACK BY AND VERIFIED WITH: A,FLORACE AT 9528 ON 04/22/19 BY A,MOHAMED Performed at [facility] [address] [place], [place] [address]   Culture, blood (routine x 2)     Status: None   Collection Time: 04/22/19  7:12 PM   Specimen: BLOOD RIGHT HAND  Result Value Ref Range Status   Specimen Description   Final    BLOOD RIGHT HAND Performed at [facility] [address], [place], [place] [address]    Special Requests   Final    BOTTLES DRAWN AEROBIC ONLY Blood Culture adequate volume Performed at [facility] [address] [place], [place] [address]    Culture   Final    NO GROWTH 5 DAYS Performed at [facility], [place] [address] [place], [place] [address]    Report Status 04/27/2019 FINAL  Final  Culture, blood (routine x 2)     Status: None   Collection Time: 04/22/19  8:48 PM   Specimen: BLOOD  Result Value Ref Range Status   Specimen Description   Final    BLOOD BLOOD RIGHT HAND Performed at [facility] [address], [place], [place] [address]    Special Requests   Final    BOTTLES DRAWN AEROBIC ONLY Blood Culture results may not be optimal due to an inadequate volume of blood received in culture bottles Performed at [facility] [address] [place], [place] [address]    Culture   Final    NO GROWTH 5 DAYS Performed at [facility], [place] [address] [place], [place] [address]    Report Status 04/27/2019 FINAL  Final  Culture, Urine     Status: Abnormal   Collection Time: 04/26/19  7:35 AM   Specimen: Urine, Catheterized  Result Value Ref Range Status   Specimen Description   Final    URINE, CATHETERIZED Performed at [facility] [address], [place], [place] [address]    Special Requests   Final    NONE Performed at [facility], [place] [name], [place], [place] [address]    Culture (A)  Final    <10,000 COLONIES/mL INSIGNIFICANT GROWTH Performed at [facility] [address] [place],  [address]    Report Status 04/27/2019 FINAL  Final     Radiology Studies: [place] RT UPPER EXTREM LTD SOFT TISSUE NON VASCULAR  Result Date: 04/28/2019 CLINICAL DATA:  SWELLING OF THE RIGHT ARM. EXAM: ULTRASOUND RIGHT UPPER EXTREMITY LIMITED TECHNIQUE: Ultrasound examination of the upper extremity soft tissues was performed in the area of clinical concern. COMPARISON:  None. FINDINGS: There is a small amount of edema in the subcutaneous fat of the anterior aspect of the left upper arm. There is also a small amount of fluid surrounding the distal biceps muscle and proximal aspect of the distal biceps tendon. No discrete abscess. IMPRESSION: No discrete abscess. Nonspecific subcutaneous edema as well as edema around the distal biceps muscle and proximal aspect of the a distal biceps tendon. Electronically Signed   By: Lorriane Shire M.D.   On:  04/28/2019 09:22   VAS [place] UPPER EXTREMITY VENOUS DUPLEX  Result Date:  04/28/2019 UPPER VENOUS STUDY  Indications: Swelling, and Edema Limitations: Bandages, body habitus and poor ultrasound/tissue interface. Performing Technologist: Antonieta Pert RDMS, RVT  Examination Guidelines: A complete evaluation includes B-mode imaging, spectral Doppler, color Doppler, and power Doppler as needed of all accessible portions of each vessel. Bilateral testing is considered an integral part of a complete examination. Limited examinations for reoccurring indications may be performed as noted.  Right Findings: +----------+------------+---------+-----------+----------+-------+ RIGHT     CompressiblePhasicitySpontaneousPropertiesSummary +----------+------------+---------+-----------+----------+-------+ Subclavian    Full       Yes       Yes                      +----------+------------+---------+-----------+----------+-------+  Left Findings: +----------+------------+---------+-----------+----------+--------------+ LEFT      CompressiblePhasicitySpontaneousProperties   Summary     +----------+------------+---------+-----------+----------+--------------+ IJV           Full                                                 +----------+------------+---------+-----------+----------+--------------+ Subclavian    Full                                                 +----------+------------+---------+-----------+----------+--------------+ Axillary      Full                                                 +----------+------------+---------+-----------+----------+--------------+ Brachial      Full       Yes       Yes                             +----------+------------+---------+-----------+----------+--------------+ Radial        Full                                                 +----------+------------+---------+-----------+----------+--------------+ Ulnar                                               Not visualized  +----------+------------+---------+-----------+----------+--------------+ Cephalic      Full                                                 +----------+------------+---------+-----------+----------+--------------+ Basilic     Partial                        dilated      Acute      +----------+------------+---------+-----------+----------+--------------+  Summary:  Right: No evidence of thrombosis in the subclavian.  Left: No evidence of deep vein  thrombosis in the upper extremity. Findings consistent with acute superficial vein thrombosis involving the left basilic vein. However, unable to visualize the mid upper arm, obscured by bandage.  *See table(s) above for measurements and observations.    Preliminary    [place] EKG SITE RITE  Result Date: 04/28/2019 If Site Rite image not attached, placement could not be confirmed due to current cardiac rhythm.    Scheduled Meds: . acidophilus  1 capsule Oral Daily  . baclofen  20 mg Oral BID  . Chlorhexidine Gluconate Cloth  6 each Topical Daily  . enoxaparin (LOVENOX) injection  40 mg Subcutaneous Daily  . feeding supplement (PRO-STAT SUGAR FREE 64)  30 mL Oral BID  . ferrous sulfate  325 mg Oral Q breakfast  . fesoterodine  8 mg Oral q morning - 10a  . lactose free nutrition  237 mL Oral TID WC  . magnesium oxide  400 mg Oral Q breakfast  . metoCLOPramide  10 mg Oral TID WC  . midodrine  10 mg Oral TID WC  . multivitamin with minerals  1 tablet Oral Daily  . pantoprazole  40 mg Oral BID  . potassium chloride  40 mEq Oral Once  . sodium chloride flush  10-40 mL Intracatheter Q12H  . sodium chloride flush  10-40 mL Intracatheter Q12H  . sucralfate  1 g Oral TID WC & HS  . vitamin C  500 mg Oral Daily  . zinc sulfate  220 mg Oral Daily   Continuous Infusions: . ceftAZIDime (FORTAZ) IVPB 2 gram/100 mL NS (Mini-Bag Plus) Stopped (04/28/19 0255)  . lactated ringers Stopped (04/28/19 0641)  . magnesium sulfate bolus IVPB       LOS: 7  days    Time spent:     Shawna Clamp, MD [facility]   If 7PM-7AM, please contact night-coverage

## 2019-04-28 NOTE — Progress Notes (Signed)
   spoke to RN-- IR not likely to be available for PICC today Can put on for tomorrow. If need emergently-- consider central line - poss CCM?

## 2019-04-28 NOTE — Progress Notes (Signed)
Left upper extremity venous duplex complete.  Preliminary results given to [facility], pts RN, @ 11:00.  Please see CV Proc tab for preliminary results. Lita Mains- RDMS, RVT 1:02 PM  04/28/2019

## 2019-04-28 NOTE — Progress Notes (Signed)
Spoke with Ubaldo Glassing RN re PICC order by VAS Team.  Per Juluis Rainier, IR places PICC's.  Ubaldo Glassing RN to notify MD.

## 2019-04-28 NOTE — Progress Notes (Signed)
VAST consulted to attempt again to start PIV. Assessed with ultrasound earlier,attempted PIV without success.Poor veins,no suitable veins visualized. Notified primary RN [employer].

## 2019-04-28 NOTE — Procedures (Signed)
Central Venous Catheter Insertion Procedure Note [name] [name] [id number] April 07, 1966  Procedure: Insertion of Central Venous Catheter Indications: Assessment of intravascular volume, Drug and/or fluid administration and Frequent blood sampling  Procedure Details Consent: Risks of procedure as well as the alternatives and risks of each were explained to the (patient/caregiver).  Consent for procedure obtained.   Time Out: Verified patient identification, verified procedure, site/side was marked, verified correct patient position, special equipment/implants available, medications/allergies/relevent history reviewed, required imaging and test results available.  Performed  Maximum sterile technique was used including antiseptics, cap, gloves, gown, hand hygiene, mask and sheet. Skin prep: Chlorhexidine; local anesthetic administered A antimicrobial bonded/coated triple lumen catheter was placed in the right internal jugular vein to 16 cm using the Seldinger technique.  Sutured in place, biopatch applied.   Evaluation Blood flow good Complications: No apparent complications Patient did tolerate procedure well. Chest X-ray ordered to verify placement.  CXR: pending.   Procedure performed under direct supervision of Dr. Melvyn Novas and with ultrasound guidance for real time vessel cannulation.      Noe Gens, MSN, NP-C  Pulmonary & Critical Care 04/28/2019, 2:32 PM   Please see [email or web address] for pager details.

## 2019-04-29 ENCOUNTER — Inpatient Hospital Stay (HOSPITAL_COMMUNITY): Payer: Medicare Other

## 2019-04-29 LAB — COMPREHENSIVE METABOLIC PANEL
ALT: 11 U/L (ref 0–44)
AST: 15 U/L (ref 15–41)
Albumin: 1.4 g/dL — ABNORMAL LOW (ref 3.5–5.0)
Alkaline Phosphatase: 75 U/L (ref 38–126)
Anion gap: 5 (ref 5–15)
BUN: 9 mg/dL (ref 6–20)
CO2: 30 mmol/L (ref 22–32)
Calcium: 7.8 mg/dL — ABNORMAL LOW (ref 8.9–10.3)
Chloride: 105 mmol/L (ref 98–111)
Creatinine, Ser: <0.3 mg/dL — ABNORMAL LOW (ref 0.61–1.24)
Glucose, Bld: 95 mg/dL (ref 70–99)
Potassium: 4 mmol/L (ref 3.5–5.1)
Sodium: 140 mmol/L (ref 135–145)
Total Bilirubin: <0.1 mg/dL — ABNORMAL LOW (ref 0.3–1.2)
Total Protein: 4.5 g/dL — ABNORMAL LOW (ref 6.5–8.1)

## 2019-04-29 LAB — TYPE AND SCREEN
ABO/RH(D): B POS
Antibody Screen: NEGATIVE
Unit division: 0

## 2019-04-29 LAB — BPAM RBC
Blood Product Expiration Date: 202101102359
ISSUE DATE / TIME: 202012131657
Unit Type and Rh: 7300

## 2019-04-29 LAB — CBC
HCT: 25.4 % — ABNORMAL LOW (ref 39.0–52.0)
Hemoglobin: 7.8 g/dL — ABNORMAL LOW (ref 13.0–17.0)
MCH: 26.8 pg (ref 26.0–34.0)
MCHC: 30.7 g/dL (ref 30.0–36.0)
MCV: 87.3 fL (ref 80.0–100.0)
Platelets: 444 10*3/uL — ABNORMAL HIGH (ref 150–400)
RBC: 2.91 MIL/uL — ABNORMAL LOW (ref 4.22–5.81)
RDW: 16.7 % — ABNORMAL HIGH (ref 11.5–15.5)
WBC: 13.3 10*3/uL — ABNORMAL HIGH (ref 4.0–10.5)
nRBC: 0 % (ref 0.0–0.2)

## 2019-04-29 LAB — HEMOGLOBIN AND HEMATOCRIT, BLOOD
HCT: 25.3 % — ABNORMAL LOW (ref 39.0–52.0)
Hemoglobin: 8 g/dL — ABNORMAL LOW (ref 13.0–17.0)

## 2019-04-29 LAB — PHOSPHORUS: Phosphorus: 3 mg/dL (ref 2.5–4.6)

## 2019-04-29 LAB — MAGNESIUM: Magnesium: 1.6 mg/dL — ABNORMAL LOW (ref 1.7–2.4)

## 2019-04-29 MED ORDER — LIDOCAINE HCL 1 % IJ SOLN
INTRAMUSCULAR | Status: DC | PRN
  Administered 2019-04-29: 5 mL

## 2019-04-29 MED ORDER — MAGNESIUM SULFATE 2 GM/50ML IV SOLN
2.0000 g | Freq: Once | INTRAVENOUS | Status: AC
  Administered 2019-04-29: 2 g via INTRAVENOUS
  Filled 2019-04-29: qty 50

## 2019-04-29 MED ORDER — LIDOCAINE HCL 1 % IJ SOLN
INTRAMUSCULAR | Status: AC
  Administered 2019-04-29: 18:00:00
  Filled 2019-04-29: qty 20

## 2019-04-29 NOTE — Procedures (Signed)
Interventional Radiology Procedure Note  Procedure: Right basilic vein DL PowerPICC.  Catheter tip at the cavoatrial junction.    Complications: None  Estimated Blood Loss: None  Recommendations: - Line ready for use - Routine line care  Signed,  Criselda Peaches, MD

## 2019-04-29 NOTE — TOC Initial Note (Addendum)
Transition of Care [facility]) - Initial/Assessment Note    Patient Details  Name: [name] [name] MRN: [id number] Date of Birth: September 03, 1965  Transition of Care [facility]) CM/SW Contact:    Lynnell Catalan, RN Phone Number: 04/29/2019, 11:14 AM  Clinical Narrative:                 Per MD note pt needs IV abx until 12/21. Pt states he would like to go to a snf to give his sister a break caring for him at home. FL2 faxed out to area facilities. Auth started with [facility].  Expected Discharge Plan: Skilled Nursing Facility Barriers to Discharge: Continued Medical Work up   Expected Discharge Plan and Services Expected Discharge Plan: [place]   Discharge Planning Services: CM Consult   Living arrangements for the past 2 months: Single Family Home                   Prior Living Arrangements/Services Living arrangements for the past 2 months: Single Family Home Lives with:: Siblings Patient language and need for interpreter reviewed:: Yes        Need for Family Participation in Patient Care: Yes (Comment) Care giver support system in place?: Yes (comment) Current home services: Homehealth aide Criminal Activity/Legal Involvement Pertinent to Current Situation/Hospitalization: No - Comment as needed  Activities of Daily Living Home Assistive Devices/Equipment: Eyeglasses, Hospital bed, [employer], Ostomy supplies, Other (Comment), Wheelchair(ramp entrance, suprapubic catheter, colostomy, electric wheelchair) ADL Screening (condition at time of admission) Patient's cognitive ability adequate to safely complete daily activities?: Yes Is the patient deaf or have difficulty hearing?: No Does the patient have difficulty seeing, even when wearing glasses/contacts?: No Does the patient have difficulty concentrating, remembering, or making decisions?: No Patient able to express need for assistance with ADLs?: Yes Does the patient have difficulty dressing or bathing?:  Yes Independently performs ADLs?: No Communication: Independent Dressing (OT): Needs assistance Is this a change from baseline?: Pre-admission baseline Grooming: Needs assistance Is this a change from baseline?: Pre-admission baseline Feeding: Needs assistance Is this a change from baseline?: Pre-admission baseline Bathing: Needs assistance Is this a change from baseline?: Pre-admission baseline Toileting: Dependent Is this a change from baseline?: Pre-admission baseline In/Out Bed: Dependent Is this a change from baseline?: Pre-admission baseline Walks in Home: Dependent Is this a change from baseline?: Pre-admission baseline Does the patient have difficulty walking or climbing stairs?: Yes(secondary to weakness) Weakness of Legs: Both Weakness of Arms/Hands: None  Permission Sought/Granted   Permission granted to share information with : Yes, Verbal Permission Granted     Permission granted to share info w AGENCY: SNFs        Emotional Assessment Appearance:: Appears stated age     Orientation: : Oriented to Self, Oriented to Place, Oriented to  Time, Oriented to Situation      Admission diagnosis:  Dehydration 99991111 Complicated UTI (urinary tract infection) [N39.0] Intractable nausea and vomiting [R11.2] Patient Active Problem List   Diagnosis Date Noted  . Hypotension 04/01/2019  . Bacteremia 03/20/2019  . Sepsis ([place]) 03/19/2019  . Osteomyelitis ([place]) 03/08/2019  . Back wound, right, initial encounter 01/23/2019  . SBO (small bowel obstruction) ([place]) 01/22/2019  . 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 UTI (urinary tract infection) 11/17/2017  . Chest wall mass   . Acute lower UTI 07/19/2017  . Quadriplegia, C5-C7, complete ([place]) 05/03/2017  . Sacral decubitus ulcer 02/28/2017  . Gastroparesis 10/08/2015  . Osteomyelitis of thoracic region (  [place])   . Multiple wounds 07/08/2015  . Pressure injury of skin 07/08/2015  . Palliative care  encounter 06/03/2015  . Hypokalemia   . Anemia of chronic disease 04/27/2015  . Iron deficiency anemia 04/27/2015  . Chronic constipation 03/16/2015  . Pressure ulcer of right upper back 06/18/2014  . Severe protein-calorie malnutrition ([place]) 03/25/2013  . Normocytic anemia 08/07/2012  . Personal history of other (healed) physical injury and trauma 08/07/2012  . OSA on CPAP 07/11/2012  . Sacral decubitus ulcer, stage IV ([place]) 04/22/2012  . S/P colostomy ([place]) 04/22/2012  . Presence of suprapubic catheter ([place]) 04/22/2012  . Quadriplegia ([place]) 07/23/2011  . Obesity 07/19/2011  . PVD 03/11/2010   PCP:  Andria Frames, PA-C Pharmacy:   [id number] [place], [address] [place] [address] Phone: [phone] Fax: [place], [address] [place] [place] [address] Phone: [phone] Fax: [phone]  [facility] [place], [place] [address] [place] [address] Phone: [phone] Fax: [phone]     Social Determinants of Health (SDOH) Interventions    Readmission Risk Interventions Readmission Risk Prevention Plan 04/29/2019 04/01/2019 03/27/2019  Transportation Screening Complete Complete Complete  PCP or Specialist Appt within 5-7 Days - - -  PCP or Specialist Appt within 3-5 Days - - -  Not Complete comments - - -  Home Care Screening - - -  Medication Review (RN CM) - - -  [place] or [place] for [place] - - -  Medication Review (RN Care Manager) Complete Referral to Pharmacy Complete  PCP or Specialist appointment within 3-5 days of discharge Complete Not Complete Complete  PCP/Specialist Appt Not Complete comments - DC date unknown -  HRI or Home Care Consult Complete Complete Complete  SW  Recovery Care/Counseling Consult Complete - Complete  Palliative Care Screening Not Applicable Not Complete Not Applicable  Comments - no desire -  Skilled Nursing Facility Complete Not Applicable Not Applicable  Some recent data might be hidden

## 2019-04-29 NOTE — Progress Notes (Addendum)
PROGRESS NOTE    CARMINO OCAIN  [id number] DOB: 1965/11/03 DOA: 04/21/2019 PCP: Andria Frames, PA-C   Brief Narrative:  Rykar Lebleu Dixonis a 53 y.o.malewith medical history significant ofquadriplegia due to a previous C5 fracture, history of decubitus ulcers, recurrent UTIs, colostomy/suprapubic catheter, chronic hypotension. Patient presented to the ED on 12/6 with complaint of nausea and vomiting. Patient feels similarly as he did with his prior UTI. No fever, had some chills. In the ED, patient had a temp of 99.2, he was tachycardic to 120s, hypotensive to 70s, breathing comfortably on room air.   Work-up showed WBC count of 15, potassium 2.9, normal renal function. Patient was admitted to hospitalist medicine service for sepsis secondary to UTI.  Assessment & Plan:   Active Problems:   Sepsis ([place])  Sepsis due to UTI caused by presence of suprapubic catheter. -Met sepsis criteria on admission with elevated white count, hypotension, significant tachycardia -Patient has history of UTI with with multiple drug-resistant bacteria's including VRE, ESBL E. coli as well as Carbapenem resistant Pseudomonas -Antibiotics management per ID, switched from [place] to ceftazidime . -As well there is a concern about bacteremia, blood cultures were done this admission after he received IV antibiotics, recommendation is to remove Port-A-Cath given he did have bacteremia on previous admissions less than 3 weeks. - Consulted to remove Port-A-Cath, patient currently has midline. -With IV fluids, antibiotics, sepsis parameters improving.  No fever, WBC count improving. -Blood cultures done 12/7, with no growth to date . -Port-A-Cath removed yesterday, PICC line inserted 12/11. Left arm swollen, PICC line removed.12/12 -ID recommends IV ceftazidime 10 days after the port is removed. - IV Nurse unsuccessful to put PICC line, - Requested ICU to put central line for iv access. -  picc line on rt  arm ordered today   Nausea/vomiting -Resolved, continue with as needed meds.  Chronic hypotension -Worsened with sepsis, he is chronically on midodrine, he is asymptomatic with these episodes . -With low cortisol level at 6.5 , initially, cosyntropin test has been performed with good response , indication to start on Solu-Cortef . -Continue IV fluid  Sacral pressure ulcer with chronic osteomyelitis POA -Recently hospitalized for this,  found to be bacteremic with Acinetobacter. - continue with wound care.-  -Plastic surgery consult appreciated,  suggest patient might require debridement of multiple wounds and placement of ACell next week.  Hypokalemia - Resolved. -Potassium 4.7, repleted.  Hypomagnesemia: Magnesium sulfate ordered.  Recheck labs tomorrow  Anemia of chronic disease -Continue iron supplementation, transfuse for hemoglobin less than 7.0 - no active bleeding    Quadriplegia -Continue home medications   DVT prophylaxis: Lovenox Code Status: Full  Family Communication: D/W patient Disposition Plan:  2-3 days. Consultants:   Infectious disease  Procedures:  Antimicrobials:  Anti-infectives (From admission, onward)   Start     Dose/Rate Route Frequency Ordered Stop   04/24/19 0200  cefTAZidime (FORTAZ) 1 g in sodium chloride 0.9 % 100 mL IVPB  Status:  Discontinued     1 g 200 mL/hr over 30 Minutes Intravenous Every 8 hours 04/23/19 1930 04/23/19 1932   04/24/19 0200  cefTAZidime (FORTAZ) 2 g in sodium chloride 0.9 % 100 mL IVPB  Status:  Discontinued     2 g 200 mL/hr over 30 Minutes Intravenous Every 8 hours 04/23/19 1932 04/23/19 1934   04/24/19 0200  ceftAZIDime (FORTAZ) 2 gram/50 mL D5W IVPB - DUPLEX  Status:  Discontinued     2 g 100 mL/hr over  30 Minutes Intravenous Every 8 hours 04/23/19 1935 04/23/19 1948   04/24/19 0200  cefTAZidime (FORTAZ) 2 g in sodium chloride 0.9 % 100 mL IVPB     2 g 200 mL/hr over 30 Minutes Intravenous Every 8  hours 04/23/19 1948 05/06/19 2359   04/21/19 1000  ceftazidime-avibactam (AVYCAZ) 2.5 g in dextrose 5 % 50 mL IVPB  Status:  Discontinued     2.5 g 25 mL/hr over 2 Hours Intravenous Every 8 hours 04/21/19 0828 04/23/19 1925   04/21/19 1000  DAPTOmycin (CUBICIN) 500 mg in sodium chloride 0.9 % IVPB  Status:  Discontinued     500 mg 220 mL/hr over 30 Minutes Intravenous Daily 04/21/19 0902 04/25/19 0752   04/21/19 0830  linezolid (ZYVOX) IVPB 600 mg  Status:  Discontinued     600 mg 300 mL/hr over 60 Minutes Intravenous  Once 04/21/19 0826 04/21/19 0829   04/21/19 0630  piperacillin-tazobactam (ZOSYN) IVPB 3.375 g     3.375 g 100 mL/hr over 30 Minutes Intravenous  Once 04/21/19 0623 04/21/19 7628      Subjective: Patient was seen and examined at bedside, denies any complaints.  Left arm swelling is still present.  Encouraged to keep it elevated.  Patient is eating and drinking fine, no trouble passing urine or stool via colostomy.  Dark brown solid stool noted today.  Blood pressure is better controlled  Objective: Vitals:   04/29/19 1300 04/29/19 1400 04/29/19 1500 04/29/19 1553  BP: 101/70 104/62 123/78   Pulse: 95 98 98   Resp: 17 (!) 22 15   Temp:    98.5 F (36.9 C)  TempSrc:    Oral  SpO2: 99% 94% 95%   Weight:      Height:        Intake/Output Summary (Last 24 hours) at 04/29/2019 1631 Last data filed at 04/29/2019 1500 Gross per 24 hour  Intake 2633.54 ml  Output 2800 ml  Net -166.46 ml   Filed Weights   04/21/19 0444 04/21/19 1130  Weight: 77.7 kg 68.9 kg    Examination:  General exam: Appears calm and comfortable.  Normal affect, frail,  chronically ill-appearing. Respiratory system: Clear to auscultation. Respiratory effort normal. Cardiovascular system: S1 & S2 heard, RRR. No JVD, murmurs, rubs, gallops or clicks. No pedal edema. Gastrointestinal system: Abdomen is nondistended, soft and nontender. No organomegaly or masses felt. Normal bowel sounds  heard. Central nervous system: Alert and oriented. No focal neurological deficits. Extremities: No pedal edema, no calf pain, Left arm swollen , non tender. Skin: Patient on multiple pressure ulcers. Psychiatry: Judgement and insight appear normal. Mood & affect appropriate.     Data Reviewed: I have personally reviewed following labs and imaging studies  CBC: Recent Labs  Lab 04/23/19 0445 04/24/19 0540 04/25/19 0500 04/26/19 0520 04/27/19 0519 04/28/19 1055 04/28/19 2356 04/29/19 0500  WBC 8.4 9.7 15.3* 15.0* 16.6* 14.6*  --  13.3*  NEUTROABS 5.8 6.3 10.6*  --   --   --   --   --   HGB 7.1* 7.3* 7.3* 7.1* 7.2* 6.9* 8.0* 7.8*  HCT 23.5* 24.5* 24.4* 23.7* 24.3* 22.7* 25.3* 25.4*  MCV 86.7 86.3 86.5 86.5 89.0 84.1  --  87.3  PLT 427* 393 433* 473* 487* 298  --  315*   Basic Metabolic Panel: Recent Labs  Lab 04/25/19 0500 04/26/19 0520 04/27/19 0519 04/28/19 1055 04/29/19 0500  NA 139 140 141 138 140  K 3.1* 3.8 3.2* 4.7 4.0  CL 109 109 107 106 105  CO2 _0 GLUCOSE 117* 107* 105* 110* 95  BUN _1 CREATININE <0.30* <0.30* <0.30* <0.30* <0.30*  CALCIUM 7.3* 7.7* 8.1* 7.9* 7.8*  MG  --   --  1.0* 1.2* 1.6*  PHOS  --   --  2.7 2.6 3.0   GFR: CrCl cannot be calculated (This lab value cannot be used to calculate CrCl because it is not a number: <0.30). Liver Function Tests: Recent Labs  Lab 04/27/19 0519 04/29/19 0500  AST 15 15  ALT 11 11  ALKPHOS 95 75  BILITOT 0.3 <0.1*  PROT 4.9* 4.5*  ALBUMIN 1.4* 1.4*   No results for input(s): LIPASE, AMYLASE in the last 168 hours. No results for input(s): AMMONIA in the last 168 hours. Coagulation Profile: No results for input(s): INR, PROTIME in the last 168 hours. Cardiac Enzymes: No results for input(s): CKTOTAL, CKMB, CKMBINDEX, TROPONINI in the last 168 hours. BNP (last 3 results) No results for input(s): PROBNP in the last 8760 hours. HbA1C: No results for input(s): HGBA1C in the last 72  hours. CBG: No results for input(s): GLUCAP in the last 168 hours. Lipid Profile: No results for input(s): CHOL, HDL, LDLCALC, TRIG, CHOLHDL, LDLDIRECT in the last 72 hours. Thyroid Function Tests: No results for input(s): TSH, T4TOTAL, FREET4, T3FREE, THYROIDAB in the last 72 hours. Anemia Panel: No results for input(s): VITAMINB12, FOLATE, FERRITIN, TIBC, IRON, RETICCTPCT in the last 72 hours. Sepsis Labs: No results for input(s): PROCALCITON, LATICACIDVEN in the last 168 hours.  Recent Results (from the past 240 hour(s))  Urine culture     Status: Abnormal   Collection Time: 04/21/19  3:46 AM   Specimen: In/Out Cath Urine  Result Value Ref Range Status   Specimen Description   Final    IN/OUT CATH URINE Performed at [place] [facility] [address] [place], [place] [address]    Special Requests   Final    NONE Performed at [facility], [place] [address] [place], [place] [address]    Culture MULTIPLE SPECIES PRESENT, SUGGEST RECOLLECTION (A)  Final   Report Status 04/22/2019 FINAL  Final  SARS CORONAVIRUS 2 (TAT 6-24 HRS) Nasopharyngeal Nasopharyngeal Swab     Status: None   Collection Time: 04/21/19  4:10 AM   Specimen: Nasopharyngeal Swab  Result Value Ref Range Status   SARS Coronavirus 2 NEGATIVE NEGATIVE Final    Comment: (NOTE) SARS-CoV-2 target nucleic acids are NOT DETECTED. The SARS-CoV-2 RNA is generally detectable in upper and lower respiratory specimens during the acute phase of infection. Negative results do not preclude SARS-CoV-2 infection, do not rule out co-infections with other pathogens, and should not be used as the sole basis for treatment or other patient management decisions. Negative results must be combined with clinical observations, patient history, and epidemiological information. The expected result is Negative. Fact Sheet for Patients: SugarRoll.be Fact Sheet for Healthcare  Providers: https://www.woods-mathews.com/ This test is not yet approved or cleared by the [place] FDA and  has been authorized for detection and/or diagnosis of SARS-CoV-2 by FDA under an Emergency Use Authorization (EUA). This EUA will remain  in effect (meaning this test can be used) for the duration of the COVID-19 declaration under Section 56 4(b)(1) of the Act, 21 U.S.C. section 360bbb-3(b)(1), unless the authorization is terminated or revoked sooner. Performed at [facility], [place] [address], [place], [place] [address]  MRSA PCR Screening     Status: Abnormal   Collection Time: 04/21/19 10:46 PM   Specimen: Nasal Mucosa; Nasopharyngeal  Result Value Ref Range Status   MRSA by PCR POSITIVE (A) NEGATIVE Final    Comment:        The GeneXpert MRSA Assay (FDA approved for NASAL specimens only), is one component of a comprehensive MRSA colonization surveillance program. It is not intended to diagnose MRSA infection nor to guide or monitor treatment for MRSA infections. RESULT CALLED TO, READ BACK BY AND VERIFIED WITH: A,FLORACE AT 8638 ON 04/22/19 BY A,MOHAMED Performed at [facility] [address], [place], [place] [address]   Culture, blood (routine x 2)     Status: None   Collection Time: 04/22/19  7:12 PM   Specimen: BLOOD RIGHT HAND  Result Value Ref Range Status   Specimen Description   Final    BLOOD RIGHT HAND Performed at [place] [address], [place], [place] [address]    Special Requests   Final    BOTTLES DRAWN AEROBIC ONLY Blood Culture adequate volume Performed at [facility] [address] [place], [place] [address]    Culture   Final    NO GROWTH 5 DAYS Performed at [facility], [place] [address] [place], [place] [address]    Report Status 04/27/2019 FINAL  Final  Culture, blood (routine x 2)     Status: None   Collection Time: 04/22/19  8:48 PM    Specimen: BLOOD  Result Value Ref Range Status   Specimen Description   Final    BLOOD BLOOD RIGHT HAND Performed at [facility] [address], [place], [place] [address]    Special Requests   Final    BOTTLES DRAWN AEROBIC ONLY Blood Culture results may not be optimal due to an inadequate volume of blood received in culture bottles Performed at [facility] [address] [place], [place] [address]    Culture   Final    NO GROWTH 5 DAYS Performed at [facility], [place] [address], [place], [place] [address]    Report Status 04/27/2019 FINAL  Final  Culture, Urine     Status: Abnormal   Collection Time: 04/26/19  7:35 AM   Specimen: Urine, Catheterized  Result Value Ref Range Status   Specimen Description   Final    URINE, CATHETERIZED Performed at [facility] [address] [place], [place] [address]    Special Requests   Final    NONE Performed at [facility], [place] [address], [place], [place] [address]    Culture (A)  Final    <10,000 COLONIES/mL INSIGNIFICANT GROWTH Performed at [facility] [address], [place], [place] [address]    Report Status 04/27/2019 FINAL  Final     Radiology Studies: DG Chest 1 View  Result Date: 04/28/2019 CLINICAL DATA:  Central line placement EXAM: CHEST  1 VIEW COMPARISON:  04/21/2019 FINDINGS: Interval placement of a right neck vascular catheter, tip positioned near the superior cavoatrial junction. There are new, layering bilateral pleural effusions and associated atelectasis or consolidation. Cardiomegaly. Unchanged, rim calcified in expansile mass of the posterior right chest wall, better assessed by prior CT. IMPRESSION: 1. Interval placement of right neck vascular catheter, tip positioned near the superior cavoatrial junction. 2. New, layering bilateral pleural effusions with associated atelectasis or consolidation. 3. Cardiomegaly.  Electronically  Signed   By: Eddie Candle M.D.   On: 04/28/2019 14:58   [place] RT UPPER EXTREM LTD SOFT TISSUE NON VASCULAR  Result Date: 04/28/2019 CLINICAL DATA:  SWELLING OF THE RIGHT ARM. EXAM: ULTRASOUND RIGHT UPPER EXTREMITY LIMITED TECHNIQUE: Ultrasound examination of the upper extremity soft tissues was performed in the area of clinical concern. COMPARISON:  None. FINDINGS: There is a small amount of edema in the subcutaneous fat of the anterior aspect of the left upper arm. There is also a small amount of fluid surrounding the distal biceps muscle and proximal aspect of the distal biceps tendon. No discrete abscess. IMPRESSION: No discrete abscess. Nonspecific subcutaneous edema as well as edema around the distal biceps muscle and proximal aspect of the a distal biceps tendon. Electronically Signed   By: Lorriane Shire M.D.   On: 04/28/2019 09:22   VAS [place] UPPER EXTREMITY VENOUS DUPLEX  Result Date: 04/28/2019 UPPER VENOUS STUDY  Indications: Swelling, and Edema Limitations: Bandages, body habitus and poor ultrasound/tissue interface. Performing Technologist: Antonieta Pert RDMS, RVT  Examination Guidelines: A complete evaluation includes B-mode imaging, spectral Doppler, color Doppler, and power Doppler as needed of all accessible portions of each vessel. Bilateral testing is considered an integral part of a complete examination. Limited examinations for reoccurring indications may be performed as noted.  Right Findings: +----------+------------+---------+-----------+----------+-------+ RIGHT     CompressiblePhasicitySpontaneousPropertiesSummary +----------+------------+---------+-----------+----------+-------+ Subclavian    Full       Yes       Yes                      +----------+------------+---------+-----------+----------+-------+  Left Findings: +----------+------------+---------+-----------+----------+--------------+ LEFT      CompressiblePhasicitySpontaneousProperties    Summary     +----------+------------+---------+-----------+----------+--------------+ IJV           Full                                                 +----------+------------+---------+-----------+----------+--------------+ Subclavian    Full                                                 +----------+------------+---------+-----------+----------+--------------+ Axillary      Full                                                 +----------+------------+---------+-----------+----------+--------------+ Brachial      Full       Yes       Yes                             +----------+------------+---------+-----------+----------+--------------+ Radial        Full                                                 +----------+------------+---------+-----------+----------+--------------+ Ulnar  Not visualized +----------+------------+---------+-----------+----------+--------------+ Cephalic      Full                                                 +----------+------------+---------+-----------+----------+--------------+ Basilic     Partial                        dilated      Acute      +----------+------------+---------+-----------+----------+--------------+  Summary:  Right: No evidence of thrombosis in the subclavian.  Left: No evidence of deep vein thrombosis in the upper extremity. Findings consistent with acute superficial vein thrombosis involving the left basilic vein. However, unable to visualize the mid upper arm, obscured by bandage.  *See table(s) above for measurements and observations.  Diagnosing physician: Servando Snare MD Electronically signed by Servando Snare MD on 04/28/2019 at 3:57:19 PM.    Final    [place] EKG SITE RITE  Result Date: 04/28/2019 If Site Rite image not attached, placement could not be confirmed due to current cardiac rhythm.    Scheduled Meds: . acidophilus  1 capsule Oral Daily  .  baclofen  20 mg Oral BID  . Chlorhexidine Gluconate Cloth  6 each Topical Daily  . enoxaparin (LOVENOX) injection  40 mg Subcutaneous Daily  . feeding supplement (PRO-STAT SUGAR FREE 64)  30 mL Oral BID  . ferrous sulfate  325 mg Oral Q breakfast  . fesoterodine  8 mg Oral q morning - 10a  . lactose free nutrition  237 mL Oral TID WC  . lidocaine      . magnesium oxide  400 mg Oral Q breakfast  . metoCLOPramide  10 mg Oral TID WC  . midodrine  10 mg Oral TID WC  . multivitamin with minerals  1 tablet Oral Daily  . pantoprazole  40 mg Oral BID  . sodium chloride flush  10-40 mL Intracatheter Q12H  . sodium chloride flush  10-40 mL Intracatheter Q12H  . sucralfate  1 g Oral TID WC & HS  . vitamin C  500 mg Oral Daily  . zinc sulfate  220 mg Oral Daily   Continuous Infusions: . ceftAZIDime (FORTAZ) IVPB 2 gram/100 mL NS (Mini-Bag Plus) Stopped (04/29/19 1333)  . lactated ringers 75 mL/hr at 04/29/19 1500     LOS: 8 days    Time spent:     Vicenta Dunning, MD [facility]   If 7PM-7AM, please contact night-coverage

## 2019-04-29 NOTE — NC FL2 (Addendum)
[place] LEVEL OF CARE SCREENING TOOL     IDENTIFICATION  Patient Name: [name] [name] Birthdate: 02-Nov-1965 Sex: male Admission Date (Current Location): 04/21/2019  [facility] and [place] Number:  Herbalist and Address:  [facility],  [place] [address], [place]      Provider Number: [id number]  Attending Physician Name and Address:  Vicenta Dunning, MD  Relative Name and Phone Number:       Current Level of Care: Hospital Recommended Level of Care: [place] Prior Approval Number:    Date Approved/Denied:   PASRR Number: [id number]  Discharge Plan: SNF    Current Diagnoses: Patient Active Problem List   Diagnosis Date Noted  . Hypotension 04/01/2019  . Bacteremia 03/20/2019  . Sepsis ([place]) 03/19/2019  . Osteomyelitis ([place]) 03/08/2019  . Back wound, right, initial encounter 01/23/2019  . SBO (small bowel obstruction) ([place]) 01/22/2019  . 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 UTI (urinary tract infection) 11/17/2017  . Chest wall mass   . Acute lower UTI 07/19/2017  . Quadriplegia, C5-C7, complete ([place]) 05/03/2017  . Sacral decubitus ulcer 02/28/2017  . Gastroparesis 10/08/2015  . Osteomyelitis of thoracic region [facility])   . Multiple wounds 07/08/2015  . Pressure injury of skin 07/08/2015  . Palliative care encounter 06/03/2015  . Hypokalemia   . Anemia of chronic disease 04/27/2015  . Iron deficiency anemia 04/27/2015  . Chronic constipation 03/16/2015  . Pressure ulcer of right upper back 06/18/2014  . Severe protein-calorie malnutrition ([place]) 03/25/2013  . Normocytic anemia 08/07/2012  . Personal history of other (healed) physical injury and trauma 08/07/2012  . OSA on CPAP 07/11/2012  . Sacral decubitus ulcer, stage IV ([place]) 04/22/2012  . S/P colostomy ([place]) 04/22/2012  . Presence of suprapubic catheter ([place]) 04/22/2012  . Quadriplegia ([place]) 07/23/2011  . Obesity 07/19/2011  .  PVD 03/11/2010    Orientation RESPIRATION BLADDER Height & Weight     Self, Time, Situation, Place  Normal Indwelling catheter(Suprapubic Catheter) Weight: 68.9 kg Height:  6' (182.9 cm)  BEHAVIORAL SYMPTOMS/MOOD NEUROLOGICAL BOWEL NUTRITION STATUS      Colostomy Diet  AMBULATORY STATUS COMMUNICATION OF NEEDS Skin   Total Care Verbally PU Stage and Appropriate Care   PU Stage 2 Dressing: TID PU Stage 3 Dressing: TID PU Stage 4 Dressing: TID  Needs low air loss mattress               Personal Care Assistance Level of Assistance  Total care(Quadriplegic)       Total Care Assistance: Maximum assistance   Functional Limitations Info  Sight Sight Info: Impaired(wears glasses)        SPECIAL CARE FACTORS FREQUENCY  PT (By licensed PT), OT (By licensed OT)     PT Frequency: 3 x weekly OT Frequency: 3 x weekly            Contractures      Additional Factors Info  Code Status, Allergies Code Status Info: Full Allergies Info: Vancomycin, Oxybutynin, Ferumoxytol           Current Medications (04/29/2019):  This is the current hospital active medication list Current Facility-Administered Medications  Medication Dose Route Frequency Provider Last Rate Last Admin  . acetaminophen (TYLENOL) tablet 650 mg  650 mg Oral Q6H PRN Caren Griffins, MD   650 mg at 04/27/19 1807  . acidophilus (RISAQUAD) capsule 1 capsule  1 capsule Oral Daily Caren Griffins, MD  1 capsule at 04/29/19 0919  . baclofen (LIORESAL) tablet 20 mg  20 mg Oral BID Caren Griffins, MD   20 mg at 04/29/19 0919  . cefTAZidime (FORTAZ) 2 g in sodium chloride 0.9 % 100 mL IVPB  2 g Intravenous Q8H Vicenta Dunning, MD   Stopped at 04/29/19 0607  . Chlorhexidine Gluconate Cloth 2 % PADS 6 each  6 each Topical Daily Caren Griffins, MD   6 each at 04/29/19 0919  . enoxaparin (LOVENOX) injection 40 mg  40 mg Subcutaneous Daily Shawna Clamp, MD   40 mg at 04/29/19 0919  . feeding supplement  (PRO-STAT SUGAR FREE 64) liquid 30 mL  30 mL Oral BID Dahal, Binaya, MD      . ferrous sulfate tablet 325 mg  325 mg Oral Q breakfast Caren Griffins, MD   325 mg at 04/29/19 0706  . fesoterodine (TOVIAZ) tablet 8 mg  8 mg Oral q morning - 10a Caren Griffins, MD   8 mg at 04/29/19 0919  . guaiFENesin (ROBITUSSIN) 100 MG/5ML solution 100 mg  5 mL Oral Q4H PRN Caren Griffins, MD      . lactated ringers infusion   Intravenous Continuous Dahal, Binaya, MD 75 mL/hr at 04/29/19 0700 Rate Verify at 04/29/19 0700  . lactose free nutrition (BOOST PLUS) liquid 237 mL  237 mL Oral TID WC Dahal, Marlowe Aschoff, MD   237 mL at 04/28/19 0947  . levalbuterol (XOPENEX) nebulizer solution 0.63 mg  0.63 mg Nebulization Q4H PRN Caren Griffins, MD      . magnesium oxide (MAG-OX) tablet 400 mg  400 mg Oral Q breakfast Caren Griffins, MD   400 mg at 04/29/19 0706  . metoCLOPramide (REGLAN) tablet 10 mg  10 mg Oral TID WC Caren Griffins, MD   10 mg at 04/29/19 0706  . midodrine (PROAMATINE) tablet 10 mg  10 mg Oral TID WC Caren Griffins, MD   10 mg at 04/29/19 0706  . multivitamin with minerals tablet 1 tablet  1 tablet Oral Daily Dahal, Marlowe Aschoff, MD   1 tablet at 04/29/19 0919  . ondansetron (ZOFRAN) injection 4 mg  4 mg Intravenous Q6H PRN Terrilee Croak, MD   4 mg at 04/28/19 2020  . ondansetron (ZOFRAN-ODT) disintegrating tablet 8 mg  8 mg Oral Q8H PRN Caren Griffins, MD   8 mg at 04/28/19 1433  . pantoprazole (PROTONIX) EC tablet 40 mg  40 mg Oral BID Caren Griffins, MD   40 mg at 04/29/19 0919  . promethazine (PHENERGAN) injection 12.5 mg  12.5 mg Intravenous Q6H PRN Shawna Clamp, MD   12.5 mg at 04/28/19 1547  . senna-docusate (Senokot-S) tablet 2 tablet  2 tablet Oral QHS PRN Caren Griffins, MD      . sodium chloride flush (NS) 0.9 % injection 10-40 mL  10-40 mL Intracatheter Q12H Caren Griffins, MD   10 mL at 04/28/19 2217  . sodium chloride flush (NS) 0.9 % injection 10-40 mL  10-40 mL  Intracatheter PRN Caren Griffins, MD      . sodium chloride flush (NS) 0.9 % injection 10-40 mL  10-40 mL Intracatheter Q12H Elgergawy, Silver Huguenin, MD   10 mL at 04/29/19 0920  . sodium chloride flush (NS) 0.9 % injection 10-40 mL  10-40 mL Intracatheter PRN Elgergawy, Silver Huguenin, MD      . sodium chloride flush (NS) 0.9 % injection 10-40 mL  10-40  mL Intracatheter PRN Shawna Clamp, MD      . sucralfate (CARAFATE) tablet 1 g  1 g Oral TID WC & HS Caren Griffins, MD   1 g at 04/29/19 0706  . vitamin C (ASCORBIC ACID) tablet 500 mg  500 mg Oral Daily Caren Griffins, MD   500 mg at 04/29/19 0919  . zinc sulfate capsule 220 mg  220 mg Oral Daily Caren Griffins, MD   220 mg at 04/29/19 [id number]     Discharge Medications: Please see discharge summary for a list of discharge medications.  Relevant Imaging Results:  Relevant Lab Results:   Additional Information SSN: [id number] Needs IV antibiotics (Ceftazidime/Fortaz until 12/21)  Contact for OMDRO, ESBL, VRE, MRSA, Vanc resistant Enterococcus.  Rejina Odle, Marjie Skiff, RN

## 2019-04-29 NOTE — Consult Note (Signed)
   [facility] CM Inpatient Consult   04/29/2019  WENDAL BICKSLER 09-10-1965 [id number]   Patient chart has been reviewed for readmissions less than 30 days and for extreme risk score, 53%, for unplanned readmissions.  Patient assessed for community [place] [facility] follow up needs.   Chart review reveals patient's PCP not in [place] of physicians. [facility] Management does not follow.  Netta Cedars, MSN, [place] Liaison Nurse Mobile Phone [phone]  Toll free office [phone]

## 2019-04-30 LAB — CBC
HCT: 25.4 % — ABNORMAL LOW (ref 39.0–52.0)
Hemoglobin: 8.1 g/dL — ABNORMAL LOW (ref 13.0–17.0)
MCH: 27.7 pg (ref 26.0–34.0)
MCHC: 31.9 g/dL (ref 30.0–36.0)
MCV: 87 fL (ref 80.0–100.0)
Platelets: 415 10*3/uL — ABNORMAL HIGH (ref 150–400)
RBC: 2.92 MIL/uL — ABNORMAL LOW (ref 4.22–5.81)
RDW: 17.4 % — ABNORMAL HIGH (ref 11.5–15.5)
WBC: 11.6 10*3/uL — ABNORMAL HIGH (ref 4.0–10.5)
nRBC: 0 % (ref 0.0–0.2)

## 2019-04-30 LAB — BASIC METABOLIC PANEL
Anion gap: 7 (ref 5–15)
BUN: 11 mg/dL (ref 6–20)
CO2: 29 mmol/L (ref 22–32)
Calcium: 7.6 mg/dL — ABNORMAL LOW (ref 8.9–10.3)
Chloride: 101 mmol/L (ref 98–111)
Creatinine, Ser: <0.3 mg/dL — ABNORMAL LOW (ref 0.61–1.24)
Glucose, Bld: 102 mg/dL — ABNORMAL HIGH (ref 70–99)
Potassium: 3.7 mmol/L (ref 3.5–5.1)
Sodium: 137 mmol/L (ref 135–145)

## 2019-04-30 MED ORDER — JUVEN PO PACK
1.0000 | PACK | Freq: Two times a day (BID) | ORAL | Status: DC
  Filled 2019-04-30: qty 1

## 2019-04-30 NOTE — Progress Notes (Signed)
Nutrition Follow-up  DOCUMENTATION CODES:   Not applicable  INTERVENTION:  - continue Boost Plus TID. - will d/c prostat.  - continue to provide feeding assistance and encourage PO intakes of meals and supplements.   -will order Juven BID, each packet provides 95 calories, 2.5 grams of protein (collagen), and 9.8 grams of carbohydrate (3 grams sugar); also contains 7 grams of L-arginine and L-glutamine, 300 mg vitamin C, 15 mg vitamin E, 1.2 mcg vitamin B-12, 9.5 mg zinc, 200 mg calcium, and 1.5 g  Calcium Beta-hydroxy-Beta-methylbutyrate to support wound healing.   NUTRITION DIAGNOSIS:   Increased nutrient needs related to acute illness, wound healing(sepsis) as evidenced by estimated needs -ongoing  GOAL:   Patient will meet greater than or equal to 90% of their needs -met  MONITOR:   PO intake, Supplement acceptance, Labs, Weight trends  ASSESSMENT:   53 y.o. male with medical history of quadriplegia d/t previous C5 fracture, decubitus ulcers, recurrent UTIs, colostomy/suprapubic catheter, and chronic hypotension. He presented to the ED on 12/6 with complaint of N/V and felt similarly as he did with his prior UTI. No fever, had some chills. He was admitted for sepsis 2/2 UTI.  Weight trending up from admission; will monitor. Per review of flow sheet documentation, he has been eating mainly 100% of meals over the past week. He has refused all packets of prostat and is accepting Boost Plus ~50% of the time offered.   Per notes: - sepsis parameters improving - CAUTI - port-a-cath removed 12/13 - PICC placed in L arm 12/11 but removed 12/12 d/t arm swelling - PICC placed in R arm 12/14 - N/V--resolved - chronic hypotension - chronic osteomyelitis - hypokalemia and hypomagnesemia--resolved - anemia of chronic disease   Labs reviewed; creatinine: <0.3 mg/dl, Ca: 7.6 mg/dl. Medications reviewed; 1 capsule risaquad/day, 325 mg ferrous sulfate/day, 400 mg mag-ox/day, 2 g IV Mg  sulfate x1 run 12/14, 10 mg oral reglan TID, daily multivitamin with minerals, 40 mg protonix BID, 1 g carafate TID, 500 mg ascorbic acid/day, 220 mg zinc sulfate/day.  IVF; LR @ 75 ml/hr.     NUTRITION - FOCUSED PHYSICAL EXAM:  not completed for patient with long-standing hx of quadriplegia  Diet Order:   Diet Order            Diet regular Room service appropriate? Yes; Fluid consistency: Thin  Diet effective now              EDUCATION NEEDS:   No education needs have been identified at this time  Skin:  Skin Assessment: Skin Integrity Issues: Skin Integrity Issues:: Stage III, Stage IV, Stage II Stage II: L toe Stage III: bilateral hips, R flank, R shoulder Stage IV: R thigh, R buttocks  Last BM:  12/15  Height:   Ht Readings from Last 1 Encounters:  04/21/19 6' (1.829 m)    Weight:   Wt Readings from Last 1 Encounters:  04/30/19 83 kg    Ideal Body Weight:  72.8 kg  BMI:  Body mass index is 24.82 kg/m.  Estimated Nutritional Needs:   Kcal:  6314-9702 kcal  Protein:  120-135 grams  Fluid:  >/= 2.2 L/day      [name], MS, RD, LDN, [facility] Inpatient Clinical Dietitian Pager # [phone] After hours/weekend pager # [phone]

## 2019-04-30 NOTE — Progress Notes (Addendum)
PROGRESS NOTE    [name] [name]  PJK:[id number] DOB: Apr 22, 1966 DOA: 04/21/2019 PCP: Andria Frames, PA-C   Brief Narrative:  [name] Carias Dixonis a 53 y.o.malewith medical history significant ofquadriplegia due to a previous C5 fracture, history of decubitus ulcers, recurrent UTIs, colostomy/suprapubic catheter, chronic hypotension. Patient presented to the ED on 12/6 with complaint of nausea and vomiting. Patient feels similarly as he did with his prior UTI. No fever, had some chills. In the ED, patient had a temp of 99.2, he was tachycardic to 120s, hypotensive to 70s, breathing comfortably on room air.   Work-up showed WBC count of 15, potassium 2.9, normal renal function. Patient was admitted to hospitalist medicine service for sepsis secondary to UTI.  Assessment & Plan:   Active Problems:   Sepsis ([place])  Sepsis due to UTI caused by presence of suprapubic catheter. -Met sepsis criteria on admission with elevated white count, hypotension, significant tachycardia -Patient has history of UTI with with multiple drug-resistant bacteria's including VRE, ESBL E. coli as well as Carbapenem resistant Pseudomonas -Antibiotics management per ID, switched from [place] to ceftazidime . -As well there is a concern about bacteremia, blood cultures were done this admission after he received IV antibiotics, recommendation is to remove Port-A-Cath given he did have bacteremia on previous admissions less than 3 weeks. - Consulted to remove Port-A-Cath, patient currently has midline. -With IV fluids, antibiotics, sepsis parameters improving.  No fever, WBC count improving. -Blood cultures done 12/7, with no growth to date . -Port-A-Cath removed yesterday, PICC line inserted 12/11. Left arm swollen, PICC line removed.12/12 -ID recommends IV ceftazidime 10 days after the port is removed. - IV Nurse unsuccessful to put PICC line, - Requested ICU to put central line for iv access. -  picc line on rt  arm ordered today   Nausea/vomiting -Resolved, continue with as needed meds.  Chronic hypotension -Worsened with sepsis, he is chronically on midodrine, he is asymptomatic with these episodes . -With low cortisol level at 6.5 , initially, cosyntropin test has been performed with good response , indication to start on Solu-Cortef . -Continue IV fluid  Sacral pressure ulcer with chronic osteomyelitis POA -Recently hospitalized for this,  found to be bacteremic with Acinetobacter. - continue with wound care.-  -Plastic surgery consult appreciated,  suggest patient might require debridement of multiple wounds and placement of ACell next week.  Awaiting input.  Hypokalemia - Resolved. -Potassium 4.7, repleted.  Hypomagnesemia: Magnesium sulfate ordered.  Recheck labs tomorrow  Anemia of chronic disease -Continue iron supplementation, transfuse for hemoglobin less than 7.0 - no active bleeding    Quadriplegia -Continue home medications  Awaiting acceptance from skilled nursing home.  As per case manager, we might not have an option.  In that case patient is agreeable to going home with home IV antibiotics.   DVT prophylaxis: Lovenox Code Status: Full  Family Communication: D/W patient Disposition Plan:  2-3 days. Consultants:   Infectious disease  Procedures:  Antimicrobials:  Anti-infectives (From admission, onward)   Start     Dose/Rate Route Frequency Ordered Stop   04/24/19 0200  cefTAZidime (FORTAZ) 1 g in sodium chloride 0.9 % 100 mL IVPB  Status:  Discontinued     1 g 200 mL/hr over 30 Minutes Intravenous Every 8 hours 04/23/19 1930 04/23/19 1932   04/24/19 0200  cefTAZidime (FORTAZ) 2 g in sodium chloride 0.9 % 100 mL IVPB  Status:  Discontinued     2 g 200 mL/hr over 30 Minutes  Intravenous Every 8 hours 04/23/19 1932 04/23/19 1934   04/24/19 0200  ceftAZIDime (FORTAZ) 2 gram/50 mL D5W IVPB - DUPLEX  Status:  Discontinued     2 g 100 mL/hr over 30 Minutes  Intravenous Every 8 hours 04/23/19 1935 04/23/19 1948   04/24/19 0200  cefTAZidime (FORTAZ) 2 g in sodium chloride 0.9 % 100 mL IVPB     2 g 200 mL/hr over 30 Minutes Intravenous Every 8 hours 04/23/19 1948 05/06/19 2359   04/21/19 1000  ceftazidime-avibactam (AVYCAZ) 2.5 g in dextrose 5 % 50 mL IVPB  Status:  Discontinued     2.5 g 25 mL/hr over 2 Hours Intravenous Every 8 hours 04/21/19 0828 04/23/19 1925   04/21/19 1000  DAPTOmycin (CUBICIN) 500 mg in sodium chloride 0.9 % IVPB  Status:  Discontinued     500 mg 220 mL/hr over 30 Minutes Intravenous Daily 04/21/19 0902 04/25/19 0752   04/21/19 0830  linezolid (ZYVOX) IVPB 600 mg  Status:  Discontinued     600 mg 300 mL/hr over 60 Minutes Intravenous  Once 04/21/19 0826 04/21/19 0829   04/21/19 0630  piperacillin-tazobactam (ZOSYN) IVPB 3.375 g     3.375 g 100 mL/hr over 30 Minutes Intravenous  Once 04/21/19 3235 04/21/19 5732      Subjective: Patient was seen and examined at bedside, denies any complaints.  Left arm swelling is much better.  Encouraged to keep it elevated.  Patient is eating and drinking fine, no trouble passing urine or stool via colostomy.   Patient is clinically stable. Blood pressure is better controlled  Objective: Vitals:   04/30/19 1200 04/30/19 1400 04/30/19 1500 04/30/19 1600  BP: 101/64 129/90  115/79  Pulse: (!) 103 (!) 106  100  Resp: 19 20  (!) 26  Temp:   98.6 F (37 C)   TempSrc:   Oral   SpO2: 99% 100%  97%  Weight:      Height:        Intake/Output Summary (Last 24 hours) at 04/30/2019 1745 Last data filed at 04/30/2019 1721 Gross per 24 hour  Intake 3584.22 ml  Output 6975 ml  Net -3390.78 ml   Filed Weights   04/21/19 0444 04/21/19 1130 04/30/19 0547  Weight: 77.7 kg 68.9 kg 83 kg    Examination:  General exam: Appears calm and comfortable.  Normal affect, frail,  chronically ill-appearing. Respiratory system: Clear to auscultation. Respiratory effort normal. Cardiovascular  system: S1 & S2 heard, RRR. No JVD, murmurs, rubs, gallops or clicks. No pedal edema. Gastrointestinal system: Abdomen is nondistended, soft and nontender. No organomegaly or masses felt. Normal bowel sounds heard. Central nervous system: Alert and oriented. No focal neurological deficits. Extremities: No pedal edema, no calf pain, Left arm swollen , non tender. Skin: Patient on multiple pressure ulcers. Psychiatry: Judgement and insight appear normal. Mood & affect appropriate.     Data Reviewed: I have personally reviewed following labs and imaging studies  CBC: Recent Labs  Lab 04/24/19 0540 04/25/19 0500 04/26/19 0520 04/27/19 0519 04/28/19 1055 04/28/19 2356 04/29/19 0500 04/30/19 0320  WBC 9.7 15.3* 15.0* 16.6* 14.6*  --  13.3* 11.6*  NEUTROABS 6.3 10.6*  --   --   --   --   --   --   HGB 7.3* 7.3* 7.1* 7.2* 6.9* 8.0* 7.8* 8.1*  HCT 24.5* 24.4* 23.7* 24.3* 22.7* 25.3* 25.4* 25.4*  MCV 86.3 86.5 86.5 89.0 84.1  --  87.3 87.0  PLT 393 433* 473*  487* 298  --  444* 629*   Basic Metabolic Panel: Recent Labs  Lab 04/26/19 0520 04/27/19 0519 04/28/19 1055 04/29/19 0500 04/30/19 0320  NA 140 141 138 140 137  K 3.8 3.2* 4.7 4.0 3.7  CL 109 107 106 105 101  CO2 25 26 25 30 29   GLUCOSE 107* 105* 110* 95 102*  BUN 10 8 8 9 11   CREATININE <0.30* <0.30* <0.30* <0.30* <0.30*  CALCIUM 7.7* 8.1* 7.9* 7.8* 7.6*  MG  --  1.0* 1.2* 1.6*  --   PHOS  --  2.7 2.6 3.0  --    GFR: CrCl cannot be calculated (This lab value cannot be used to calculate CrCl because it is not a number: <0.30). Liver Function Tests: Recent Labs  Lab 04/27/19 0519 04/29/19 0500  AST 15 15  ALT 11 11  ALKPHOS 95 75  BILITOT 0.3 <0.1*  PROT 4.9* 4.5*  ALBUMIN 1.4* 1.4*   No results for input(s): LIPASE, AMYLASE in the last 168 hours. No results for input(s): AMMONIA in the last 168 hours. Coagulation Profile: No results for input(s): INR, PROTIME in the last 168 hours. Cardiac Enzymes: No  results for input(s): CKTOTAL, CKMB, CKMBINDEX, TROPONINI in the last 168 hours. BNP (last 3 results) No results for input(s): PROBNP in the last 8760 hours. HbA1C: No results for input(s): HGBA1C in the last 72 hours. CBG: No results for input(s): GLUCAP in the last 168 hours. Lipid Profile: No results for input(s): CHOL, HDL, LDLCALC, TRIG, CHOLHDL, LDLDIRECT in the last 72 hours. Thyroid Function Tests: No results for input(s): TSH, T4TOTAL, FREET4, T3FREE, THYROIDAB in the last 72 hours. Anemia Panel: No results for input(s): VITAMINB12, FOLATE, FERRITIN, TIBC, IRON, RETICCTPCT in the last 72 hours. Sepsis Labs: No results for input(s): PROCALCITON, LATICACIDVEN in the last 168 hours.  Recent Results (from the past 240 hour(s))  Urine culture     Status: Abnormal   Collection Time: 04/21/19  3:46 AM   Specimen: In/Out Cath Urine  Result Value Ref Range Status   Specimen Description   Final    IN/OUT CATH URINE Performed at [facility] [address], [place], [place] [address]    Special Requests   Final    NONE Performed at [facility], [place] [address] [place], [place] [address]    Culture MULTIPLE SPECIES PRESENT, SUGGEST RECOLLECTION (A)  Final   Report Status 04/22/2019 FINAL  Final  SARS CORONAVIRUS 2 (TAT 6-24 HRS) Nasopharyngeal Nasopharyngeal Swab     Status: None   Collection Time: 04/21/19  4:10 AM   Specimen: Nasopharyngeal Swab  Result Value Ref Range Status   SARS Coronavirus 2 NEGATIVE NEGATIVE Final    Comment: (NOTE) SARS-CoV-2 target nucleic acids are NOT DETECTED. The SARS-CoV-2 RNA is generally detectable in upper and lower respiratory specimens during the acute phase of infection. Negative results do not preclude SARS-CoV-2 infection, do not rule out co-infections with other pathogens, and should not be used as the sole basis for treatment or other patient management decisions. Negative results must be  combined with clinical observations, patient history, and epidemiological information. The expected result is Negative. Fact Sheet for Patients: SugarRoll.be Fact Sheet for Healthcare Providers: https://www.woods-mathews.com/ This test is not yet approved or cleared by the [place] FDA and  has been authorized for detection and/or diagnosis of SARS-CoV-2 by FDA under an Emergency Use Authorization (EUA). This EUA will remain  in effect (meaning this test can be  used) for the duration of the COVID-19 declaration under Section 56 4(b)(1) of the Act, 21 U.S.C. section 360bbb-3(b)(1), unless the authorization is terminated or revoked sooner. Performed at [facility], [place] [address], [place], [place] [address]   MRSA PCR Screening     Status: Abnormal   Collection Time: 04/21/19 10:46 PM   Specimen: Nasal Mucosa; Nasopharyngeal  Result Value Ref Range Status   MRSA by PCR POSITIVE (A) NEGATIVE Final    Comment:        The GeneXpert MRSA Assay (FDA approved for NASAL specimens only), is one component of a comprehensive MRSA colonization surveillance program. It is not intended to diagnose MRSA infection nor to guide or monitor treatment for MRSA infections. RESULT CALLED TO, READ BACK BY AND VERIFIED WITH: A,FLORACE AT 6045 ON 04/22/19 BY A,MOHAMED Performed at [facility] [address] [place], [place] [address]   Culture, blood (routine x 2)     Status: None   Collection Time: 04/22/19  7:12 PM   Specimen: BLOOD RIGHT HAND  Result Value Ref Range Status   Specimen Description   Final    BLOOD RIGHT HAND Performed at [facility] [address], [place], [place] [address]    Special Requests   Final    BOTTLES DRAWN AEROBIC ONLY Blood Culture adequate volume Performed at [facility] [address], [place], [place] [address]    Culture   Final    NO GROWTH 5  DAYS Performed at [facility], [place] [address] [place], [place] [address]    Report Status 04/27/2019 FINAL  Final  Culture, blood (routine x 2)     Status: None   Collection Time: 04/22/19  8:48 PM   Specimen: BLOOD  Result Value Ref Range Status   Specimen Description   Final    BLOOD BLOOD RIGHT HAND Performed at [facility] [address], [place], [place] [address]    Special Requests   Final    BOTTLES DRAWN AEROBIC ONLY Blood Culture results may not be optimal due to an inadequate volume of blood received in culture bottles Performed at [facility] [address] [place], [place] [address]    Culture   Final    NO GROWTH 5 DAYS Performed at [facility], [place] [address] [place], [place] [address]    Report Status 04/27/2019 FINAL  Final  Culture, Urine     Status: Abnormal   Collection Time: 04/26/19  7:35 AM   Specimen: Urine, Catheterized  Result Value Ref Range Status   Specimen Description   Final    URINE, CATHETERIZED Performed at [facility] [address] [place], [place] [address]    Special Requests   Final    NONE Performed at [facility], [place] [address] [place], [place] [address]    Culture (A)  Final    <10,000 COLONIES/mL INSIGNIFICANT GROWTH Performed at [facility] [address] [place], [place] [address]    Report Status 04/27/2019 FINAL  Final     Radiology Studies: IR PICC PLACEMENT RIGHT >5 YRS INC IMG GUIDE  Result Date: 04/29/2019 INDICATION: 53 year old male quadriplegic with poor venous access. He recently had a right chest port catheter removed for bacteremia and requires line replacement for IV antibiotic therapy. He presents for placement of a PICC. EXAM: PICC LINE PLACEMENT WITH ULTRASOUND AND FLUOROSCOPIC GUIDANCE MEDICATIONS:  None. ANESTHESIA/SEDATION: None. FLUOROSCOPY TIME:  Fluoroscopy Time: 0 minutes 42 seconds (3.8 mGy).  COMPLICATIONS: None immediate. PROCEDURE: The patient was advised of the possible risks and complications and agreed to undergo the procedure. The patient was then brought to the angiographic suite for the procedure. The right arm was prepped with chlorhexidine, draped in the usual sterile fashion using maximum barrier technique (cap and mask, sterile gown, sterile gloves, large sterile sheet, hand hygiene and cutaneous antisepsis) and infiltrated locally with 1% Lidocaine. Ultrasound demonstrated patency of the right basilic vein, and this was documented with an image. Under real-time ultrasound guidance, this vein was accessed with a 21 gauge micropuncture needle and image documentation was performed. A 0.018 wire was introduced in to the vein. Over this, a 6 [place] dual lumen power-injectable PICC was advanced to the lower SVC/right atrial junction. Fluoroscopy during the procedure and fluoro spot radiograph confirms appropriate catheter position. The catheter was flushed and covered with a sterile dressing. Catheter length: 34 cm IMPRESSION: Successful right arm Power PICC line placement with ultrasound and fluoroscopic guidance. The catheter is ready for use. Electronically Signed   By: Jacqulynn Cadet M.D.   On: 04/29/2019 16:55     Scheduled Meds: . acidophilus  1 capsule Oral Daily  . baclofen  20 mg Oral BID  . Chlorhexidine Gluconate Cloth  6 each Topical Daily  . enoxaparin (LOVENOX) injection  40 mg Subcutaneous Daily  . ferrous sulfate  325 mg Oral Q breakfast  . fesoterodine  8 mg Oral q morning - 10a  . lactose free nutrition  237 mL Oral TID WC  . magnesium oxide  400 mg Oral Q breakfast  . metoCLOPramide  10 mg Oral TID WC  . midodrine  10 mg Oral TID WC  . multivitamin with minerals  1 tablet Oral Daily  . nutrition supplement (JUVEN)  1 packet Oral BID BM  . pantoprazole  40 mg Oral BID  . sodium chloride flush  10-40 mL Intracatheter Q12H  . sodium chloride flush  10-40 mL  Intracatheter Q12H  . sucralfate  1 g Oral TID WC & HS  . vitamin C  500 mg Oral Daily  . zinc sulfate  220 mg Oral Daily   Continuous Infusions: . ceftAZIDime (FORTAZ) IVPB 2 gram/100 mL NS (Mini-Bag Plus) Stopped (04/30/19 1420)  . lactated ringers 75 mL/hr at 04/30/19 1600     LOS: 9 days    Time spent:     Vicenta Dunning, MD [facility]   If 7PM-7AM, please contact night-coverage

## 2019-05-01 ENCOUNTER — Inpatient Hospital Stay (HOSPITAL_COMMUNITY): Payer: Medicare Other

## 2019-05-01 DIAGNOSIS — I950 Idiopathic hypotension: Secondary | ICD-10-CM

## 2019-05-01 LAB — BASIC METABOLIC PANEL
Anion gap: 7 (ref 5–15)
BUN: 10 mg/dL (ref 6–20)
CO2: 31 mmol/L (ref 22–32)
Calcium: 7.8 mg/dL — ABNORMAL LOW (ref 8.9–10.3)
Chloride: 101 mmol/L (ref 98–111)
Creatinine, Ser: <0.3 mg/dL — ABNORMAL LOW (ref 0.61–1.24)
Glucose, Bld: 106 mg/dL — ABNORMAL HIGH (ref 70–99)
Potassium: 4 mmol/L (ref 3.5–5.1)
Sodium: 139 mmol/L (ref 135–145)

## 2019-05-01 LAB — CBC
HCT: 25.9 % — ABNORMAL LOW (ref 39.0–52.0)
Hemoglobin: 8 g/dL — ABNORMAL LOW (ref 13.0–17.0)
MCH: 27.2 pg (ref 26.0–34.0)
MCHC: 30.9 g/dL (ref 30.0–36.0)
MCV: 88.1 fL (ref 80.0–100.0)
Platelets: 403 10*3/uL — ABNORMAL HIGH (ref 150–400)
RBC: 2.94 MIL/uL — ABNORMAL LOW (ref 4.22–5.81)
RDW: 18.2 % — ABNORMAL HIGH (ref 11.5–15.5)
WBC: 10.7 10*3/uL — ABNORMAL HIGH (ref 4.0–10.5)
nRBC: 0 % (ref 0.0–0.2)

## 2019-05-01 LAB — SARS CORONAVIRUS 2 (TAT 6-24 HRS): SARS Coronavirus 2: POSITIVE — AB

## 2019-05-01 MED ORDER — SODIUM CHLORIDE 0.9% IV SOLUTION: Freq: Once | INTRAVENOUS | Status: DC

## 2019-05-01 NOTE — Progress Notes (Addendum)
PROGRESS NOTE    [name] [name]  RTM:[id number] DOB: 11/20/1965 DOA: 04/21/2019 PCP: Andria Frames, PA-C   Brief Narrative:  [name] Kamphaus Dixonis a 53 y.o.malewith medical history significant ofquadriplegia due to a previous C5 fracture, history of decubitus ulcers, recurrent UTIs, colostomy/suprapubic catheter, chronic hypotension. Patient presented to the ED on 12/6 with complaint of nausea and vomiting. Patient feels similarly as he did with his prior UTI. No fever, had some chills. In the ED, patient had a temp of 99.2, he was tachycardic to 120s, hypotensive to 70s, breathing comfortably on room air.   Work-up showed WBC count of 15, potassium 2.9, normal renal function. Patient was admitted to hospitalist medicine service for sepsis secondary to UTI.  Assessment & Plan:   Active Problems:   Sepsis ([place])  Sepsis due to UTI caused by presence of suprapubic catheter. -Met sepsis criteria on admission with elevated white count, hypotension, significant tachycardia -Patient has history of UTI with with multiple drug-resistant bacteria's including VRE, ESBL E. coli as well as Carbapenem resistant Pseudomonas -Antibiotics management per ID, switched from [place] to ceftazidime . -As well there is a concern about bacteremia, blood cultures were done this admission after he received IV antibiotics, recommendation is to remove Port-A-Cath given he did have bacteremia on previous admissions less than 3 weeks. - Consulted to remove Port-A-Cath, patient currently has midline. -With IV fluids, antibiotics, sepsis parameters improving.  No fever, WBC count improving. -Blood cultures done 12/7, with no growth to date . -Port-A-Cath removed yesterday, PICC line inserted 12/11. Left arm swollen, PICC line removed.12/12 -ID recommends IV ceftazidime 10 days after the port is removed. - IV Nurse unsuccessful to put PICC line, - Requested ICU to put central line for iv access. -  picc line on rt  arm ordered today   Nausea/vomiting -Resolved, continue with as needed meds.  Chronic hypotension -Worsened with sepsis, he is chronically on midodrine, he is asymptomatic with these episodes . -With low cortisol level at 6.5 , initially, cosyntropin test has been performed with good response , indication to start on Solu-Cortef . -DIsContinue IV fluid  Sacral pressure ulcer with chronic osteomyelitis POA -Recently hospitalized for this,  found to be bacteremic with Acinetobacter. - continue with wound care.-  -Plastic surgery consult appreciated,  suggest patient might require debridement of multiple wounds and placement of ACell next week.  Awaiting input.  Hypokalemia - Resolved. -Potassium 4.7, repleted.  Hypomagnesemia: Magnesium sulfate ordered.  Recheck labs tomorrow  Anemia of chronic disease -Continue iron supplementation, transfuse for hemoglobin less than 7.0 - no active bleeding    Quadriplegia -Continue home medications  Positive COVID-19 test-we will repeat another test as per ID.  Continue isolation precautions until then  cxr shows stable bilateral small effusions and opacities which seems to be stable possibly atelectasis.  Awaiting acceptance from skilled nursing home.  As per case manager, we might not have an option.  In that case patient is agreeable to going home with home IV antibiotics.   DVT prophylaxis: Lovenox Code Status: Full  Family Communication: D/W patient Disposition Plan:  2-3 days. Consultants:   Infectious disease  Procedures:  Antimicrobials:  Anti-infectives (From admission, onward)   Start     Dose/Rate Route Frequency Ordered Stop   04/24/19 0200  cefTAZidime (FORTAZ) 1 g in sodium chloride 0.9 % 100 mL IVPB  Status:  Discontinued     1 g 200 mL/hr over 30 Minutes Intravenous Every 8 hours 04/23/19 1930 04/23/19  1932   04/24/19 0200  cefTAZidime (FORTAZ) 2 g in sodium chloride 0.9 % 100 mL IVPB  Status:  Discontinued      2 g 200 mL/hr over 30 Minutes Intravenous Every 8 hours 04/23/19 1932 04/23/19 1934   04/24/19 0200  ceftAZIDime (FORTAZ) 2 gram/50 mL D5W IVPB - DUPLEX  Status:  Discontinued     2 g 100 mL/hr over 30 Minutes Intravenous Every 8 hours 04/23/19 1935 04/23/19 1948   04/24/19 0200  cefTAZidime (FORTAZ) 2 g in sodium chloride 0.9 % 100 mL IVPB     2 g 200 mL/hr over 30 Minutes Intravenous Every 8 hours 04/23/19 1948 05/06/19 2359   04/21/19 1000  ceftazidime-avibactam (AVYCAZ) 2.5 g in dextrose 5 % 50 mL IVPB  Status:  Discontinued     2.5 g 25 mL/hr over 2 Hours Intravenous Every 8 hours 04/21/19 0828 04/23/19 1925   04/21/19 1000  DAPTOmycin (CUBICIN) 500 mg in sodium chloride 0.9 % IVPB  Status:  Discontinued     500 mg 220 mL/hr over 30 Minutes Intravenous Daily 04/21/19 0902 04/25/19 0752   04/21/19 0830  linezolid (ZYVOX) IVPB 600 mg  Status:  Discontinued     600 mg 300 mL/hr over 60 Minutes Intravenous  Once 04/21/19 0826 04/21/19 0829   04/21/19 0630  piperacillin-tazobactam (ZOSYN) IVPB 3.375 g     3.375 g 100 mL/hr over 30 Minutes Intravenous  Once 04/21/19 3888 04/21/19 2800      Subjective: Patient was seen and examined at bedside, denies any complaints.   Patient is eating and drinking fine, no trouble passing urine or stool via colostomy.   Patient is clinically stable.  Patient blood pressure is low today with reflex tachycardia.  He is not currently on any medication that will cause significant drop in blood pressure.  He does have chronically low blood pressure, however he is currently asymptomatic.  Patient's  Covid test came back positive.  He has been placed on resolution.  As per infectious disease advice, repeat Covid test has been ordered as this could be false positive/nosocomial Covid.  I have discussed treatment options with him.  At the moment he is amenable to convalescent plasma.  He is not a candidate for remdesivir or steroids as he is breathing fine on  room air.  He denies any congestion nausea vomiting diarrhea chest pain shortness of breath or palpitations   Objective: Vitals:   05/01/19 1315 05/01/19 1400 05/01/19 1600 05/01/19 1625  BP: 103/68 115/77 (!) 74/50 (!) 85/52  Pulse: (!) 116 (!) 110 (!) 107 (!) 104  Resp: 10 14 19  (!) 21  Temp:    99.1 F (37.3 C)  TempSrc:    Oral  SpO2: 99% 99% 98% 99%  Weight:      Height:        Intake/Output Summary (Last 24 hours) at 05/01/2019 1746 Last data filed at 05/01/2019 1646 Gross per 24 hour  Intake 2618.43 ml  Output 4900 ml  Net -2281.57 ml   Filed Weights   04/21/19 0444 04/21/19 1130 04/30/19 0547  Weight: 77.7 kg 68.9 kg 83 kg    Examination:  General exam: Appears calm and comfortable.  Normal affect, frail,  chronically ill-appearing.  Morbidly obese Respiratory system: few crackles , left base no wheezing or rhonchi.Marland Kitchen Respiratory effort normal. Cardiovascular system: S1 & S2 heard, tachycardic no JVD, murmurs, rubs, gallops or clicks. No pedal edema. Gastrointestinal system: Abdomen is nondistended, soft and nontender. No organomegaly  or masses felt. Normal bowel sounds heard. Central nervous system: Alert and oriented. No focal neurological deficits. Extremities: No pedal edema, no calf pain, Left arm swollen , non tender. Skin: Patient on multiple pressure ulcers. Psychiatry: Judgement and insight appear normal. Mood & affect appropriate.     Data Reviewed: I have personally reviewed following labs and imaging studies  CBC: Recent Labs  Lab 04/25/19 0500 04/27/19 0519 04/28/19 1055 04/28/19 2356 04/29/19 0500 04/30/19 0320 05/01/19 0335  WBC 15.3* 16.6* 14.6*  --  13.3* 11.6* 10.7*  NEUTROABS 10.6*  --   --   --   --   --   --   HGB 7.3* 7.2* 6.9* 8.0* 7.8* 8.1* 8.0*  HCT 24.4* 24.3* 22.7* 25.3* 25.4* 25.4* 25.9*  MCV 86.5 89.0 84.1  --  87.3 87.0 88.1  PLT 433* 487* 298  --  444* 415* 768*   Basic Metabolic Panel: Recent Labs  Lab  04/27/19 0519 04/28/19 1055 04/29/19 0500 04/30/19 0320 05/01/19 0335  NA 141 138 140 137 139  K 3.2* 4.7 4.0 3.7 4.0  CL 107 106 105 101 101  CO2 26 25 30 29 31   GLUCOSE 105* 110* 95 102* 106*  BUN 8 8 9 11 10   CREATININE <0.30* <0.30* <0.30* <0.30* <0.30*  CALCIUM 8.1* 7.9* 7.8* 7.6* 7.8*  MG 1.0* 1.2* 1.6*  --   --   PHOS 2.7 2.6 3.0  --   --    GFR: CrCl cannot be calculated (This lab value cannot be used to calculate CrCl because it is not a number: <0.30). Liver Function Tests: Recent Labs  Lab 04/27/19 0519 04/29/19 0500  AST 15 15  ALT 11 11  ALKPHOS 95 75  BILITOT 0.3 <0.1*  PROT 4.9* 4.5*  ALBUMIN 1.4* 1.4*   No results for input(s): LIPASE, AMYLASE in the last 168 hours. No results for input(s): AMMONIA in the last 168 hours. Coagulation Profile: No results for input(s): INR, PROTIME in the last 168 hours. Cardiac Enzymes: No results for input(s): CKTOTAL, CKMB, CKMBINDEX, TROPONINI in the last 168 hours. BNP (last 3 results) No results for input(s): PROBNP in the last 8760 hours. HbA1C: No results for input(s): HGBA1C in the last 72 hours. CBG: No results for input(s): GLUCAP in the last 168 hours. Lipid Profile: No results for input(s): CHOL, HDL, LDLCALC, TRIG, CHOLHDL, LDLDIRECT in the last 72 hours. Thyroid Function Tests: No results for input(s): TSH, T4TOTAL, FREET4, T3FREE, THYROIDAB in the last 72 hours. Anemia Panel: No results for input(s): VITAMINB12, FOLATE, FERRITIN, TIBC, IRON, RETICCTPCT in the last 72 hours. Sepsis Labs: No results for input(s): PROCALCITON, LATICACIDVEN in the last 168 hours.  Recent Results (from the past 240 hour(s))  MRSA PCR Screening     Status: Abnormal   Collection Time: 04/21/19 10:46 PM   Specimen: Nasal Mucosa; Nasopharyngeal  Result Value Ref Range Status   MRSA by PCR POSITIVE (A) NEGATIVE Final    Comment:        The GeneXpert MRSA Assay (FDA approved for NASAL specimens only), is one component of  a comprehensive MRSA colonization surveillance program. It is not intended to diagnose MRSA infection nor to guide or monitor treatment for MRSA infections. RESULT CALLED TO, READ BACK BY AND VERIFIED WITH: A,FLORACE AT 1157 ON 04/22/19 BY A,MOHAMED Performed at [facility] [address], [place], [place] [address]   Culture, blood (routine x 2)     Status: None   Collection  Time: 04/22/19  7:12 PM   Specimen: BLOOD RIGHT HAND  Result Value Ref Range Status   Specimen Description   Final    BLOOD RIGHT HAND Performed at [facility] [address] [place], [place] [address]    Special Requests   Final    BOTTLES DRAWN AEROBIC ONLY Blood Culture adequate volume Performed at [facility] [address] [place], [place] [address]    Culture   Final    NO GROWTH 5 DAYS Performed at [facility], [place] [address], [place], [place] [address]    Report Status 04/27/2019 FINAL  Final  Culture, blood (routine x 2)     Status: None   Collection Time: 04/22/19  8:48 PM   Specimen: BLOOD  Result Value Ref Range Status   Specimen Description   Final    BLOOD BLOOD RIGHT HAND Performed at [facility] [address], [place], [place] [address]    Special Requests   Final    BOTTLES DRAWN AEROBIC ONLY Blood Culture results may not be optimal due to an inadequate volume of blood received in culture bottles Performed at [facility] [address], [place], [place] [address]    Culture   Final    NO GROWTH 5 DAYS Performed at [facility], [place] [address] [place], [place] [address]    Report Status 04/27/2019 FINAL  Final  Culture, Urine     Status: Abnormal   Collection Time: 04/26/19  7:35 AM   Specimen: Urine, Catheterized  Result Value Ref Range Status   Specimen Description   Final    URINE, CATHETERIZED Performed at [facility] [address], [place], [place] [address]    Special Requests   Final    NONE Performed at [facility], [place] [address], [place], [place] [address]    Culture (A)  Final    <10,000 COLONIES/mL INSIGNIFICANT GROWTH Performed at [facility] [address] [place], [place] [address]    Report Status 04/27/2019 FINAL  Final  SARS CORONAVIRUS 2 (TAT 6-24 HRS) Nasopharyngeal Nasopharyngeal Swab     Status: Abnormal   Collection Time: 04/30/19  5:49 PM   Specimen: Nasopharyngeal Swab  Result Value Ref Range Status   SARS Coronavirus 2 POSITIVE (A) NEGATIVE Final    Comment: RESULT CALLED TO, READ BACK BY AND VERIFIED WITH: J HOUSE,RN 0119 05/01/2019 D BRADLEY (NOTE) SARS-CoV-2 target nucleic acids are DETECTED. The SARS-CoV-2 RNA is generally detectable in upper and lower respiratory specimens during the acute phase of infection. Positive results are indicative of the presence of SARS-CoV-2 RNA. Clinical correlation with patient history and other diagnostic information is  necessary to determine patient infection status. Positive results do not rule out bacterial infection or co-infection with other viruses.  The expected result is Negative. Fact Sheet for Patients: SugarRoll.be Fact Sheet for Healthcare Providers: https://www.woods-mathews.com/ This test is not yet approved or cleared by the [place] FDA and  has been authorized for detection and/or diagnosis of SARS-CoV-2 by FDA under an Emergency Use Authorization (EUA). This EUA will remain  in effect (meaning this test can be used) for th e duration of the COVID-19 declaration under Section 564(b)(1) of the Act, 21 U.S.C. section 360bbb-3(b)(1), unless the authorization is terminated or revoked sooner. Performed at [facility], [place] [address], [place], [place] [address]   Respiratory Panel by RT  PCR (Flu A&B, Covid) - Nasopharyngeal Swab     Status: None    Collection Time: 05/01/19  9:37 AM   Specimen: Nasopharyngeal Swab  Result Value Ref Range Status   SARS Coronavirus 2 by RT PCR NEGATIVE NEGATIVE Final    Comment: (NOTE) SARS-CoV-2 target nucleic acids are NOT DETECTED. The SARS-CoV-2 RNA is generally detectable in upper respiratoy specimens during the acute phase of infection. The lowest concentration of SARS-CoV-2 viral copies this assay can detect is 131 copies/mL. A negative result does not preclude SARS-Cov-2 infection and should not be used as the sole basis for treatment or other patient management decisions. A negative result may occur with  improper specimen collection/handling, submission of specimen other than nasopharyngeal swab, presence of viral mutation(s) within the areas targeted by this assay, and inadequate number of viral copies (<131 copies/mL). A negative result must be combined with clinical observations, patient history, and epidemiological information. The expected result is Negative. Fact Sheet for Patients:  PinkCheek.be Fact Sheet for Healthcare Providers:  GravelBags.it This test is not yet ap proved or cleared by the [place] FDA and  has been authorized for detection and/or diagnosis of SARS-CoV-2 by FDA under an Emergency Use Authorization (EUA). This EUA will remain  in effect (meaning this test can be used) for the duration of the COVID-19 declaration under Section 564(b)(1) of the Act, 21 U.S.C. section 360bbb-3(b)(1), unless the authorization is terminated or revoked sooner.    Influenza A by PCR NEGATIVE NEGATIVE Final   Influenza B by PCR NEGATIVE NEGATIVE Final    Comment: (NOTE) The Xpert Xpress SARS-CoV-2/FLU/RSV assay is intended as an aid in  the diagnosis of influenza from Nasopharyngeal swab specimens and  should not be used as a sole basis for treatment. Nasal washings and  aspirates are unacceptable for Xpert Xpress  SARS-CoV-2/FLU/RSV  testing. Fact Sheet for Patients: PinkCheek.be Fact Sheet for Healthcare Providers: GravelBags.it This test is not yet approved or cleared by the [place] FDA and  has been authorized for detection and/or diagnosis of SARS-CoV-2 by  FDA under an Emergency Use Authorization (EUA). This EUA will remain  in effect (meaning this test can be used) for the duration of the  Covid-19 declaration under Section 564(b)(1) of the Act, 21  U.S.C. section 360bbb-3(b)(1), unless the authorization is  terminated or revoked. Performed at [facility], [place] [address] [place], [place] [address]      Radiology Studies: DG Chest 1 View  Result Date: 05/01/2019 CLINICAL DATA:  COVID-19 positive patient.  Quadriplegia. EXAM: CHEST  1 VIEW COMPARISON:  April 28, 2019 FINDINGS: The right central line is been removed. A right PICC line is been placed, terminating in the central SVC. A layering effusion on the right is again identified with underlying opacity. Mild opacity in the left perihilar and retrocardiac regions are stable. The cardiomediastinal silhouette is stable. No other acute abnormalities. IMPRESSION: 1. A new right PICC line is in good position terminating in the central SVC. 2. Probable small bilateral layering effusions. Opacity underlies the right effusion, stable. Mild left perihilar and retrocardiac opacities are stable as well. Electronically Signed   By: Dorise Bullion III M.D   On: 05/01/2019 14:33     Scheduled Meds: . sodium chloride   Intravenous Once  . acidophilus  1 capsule Oral Daily  . baclofen  20 mg Oral BID  . Chlorhexidine Gluconate Cloth  6 each Topical Daily  . enoxaparin (LOVENOX) injection  40  mg Subcutaneous Daily  . ferrous sulfate  325 mg Oral Q breakfast  . fesoterodine  8 mg Oral q morning - 10a  . lactose free nutrition  237 mL Oral TID WC  . magnesium oxide   400 mg Oral Q breakfast  . metoCLOPramide  10 mg Oral TID WC  . midodrine  10 mg Oral TID WC  . multivitamin with minerals  1 tablet Oral Daily  . nutrition supplement (JUVEN)  1 packet Oral BID BM  . pantoprazole  40 mg Oral BID  . sodium chloride flush  10-40 mL Intracatheter Q12H  . sodium chloride flush  10-40 mL Intracatheter Q12H  . sucralfate  1 g Oral TID WC & HS  . vitamin C  500 mg Oral Daily  . zinc sulfate  220 mg Oral Daily   Continuous Infusions: . ceftAZIDime (FORTAZ) IVPB 2 gram/100 mL NS (Mini-Bag Plus) Stopped (05/01/19 1615)  . lactated ringers 75 mL/hr at 05/01/19 1652     LOS: 10 days    Time spent:     Vicenta Dunning, MD [facility]   If 7PM-7AM, please contact night-coverage

## 2019-05-01 NOTE — Progress Notes (Signed)
Went to see patient, he was at IR for procedure, will follow up to further evaluate wounds tomorrow. At this time, continue with local wound care.   At next dressing change, if nursing staff is able to take photos, that would be beneficial.

## 2019-05-01 NOTE — Progress Notes (Signed)
  We will repeat covid PCR test to look at CT value. Concern for possible false positive vs nosocomial covid-19.  Elzie [facility] [phone]

## 2019-05-01 NOTE — Progress Notes (Signed)
CRITICAL VALUE ALERT  Critical Value: Covid +  Date & Time Notied:  05/01/2019 0130   Provider Notified: Beaulah Corin, NP   Orders Received/Actions taken: Airborne precautions initiated.

## 2019-05-01 NOTE — Progress Notes (Signed)
Per attending MD, hold plasma for now until decision is made on covid results.  Will continue to monitor.

## 2019-05-02 ENCOUNTER — Inpatient Hospital Stay (HOSPITAL_COMMUNITY): Payer: Medicare Other

## 2019-05-02 DIAGNOSIS — D638 Anemia in other chronic diseases classified elsewhere: Secondary | ICD-10-CM

## 2019-05-02 LAB — PREPARE FRESH FROZEN PLASMA

## 2019-05-02 LAB — BPAM FFP
Blood Product Expiration Date: 202012171646
Unit Type and Rh: 7300

## 2019-05-02 LAB — CBC
HCT: 25.9 % — ABNORMAL LOW (ref 39.0–52.0)
Hemoglobin: 8 g/dL — ABNORMAL LOW (ref 13.0–17.0)
MCH: 27.4 pg (ref 26.0–34.0)
MCHC: 30.9 g/dL (ref 30.0–36.0)
MCV: 88.7 fL (ref 80.0–100.0)
Platelets: 414 10*3/uL — ABNORMAL HIGH (ref 150–400)
RBC: 2.92 MIL/uL — ABNORMAL LOW (ref 4.22–5.81)
RDW: 18.5 % — ABNORMAL HIGH (ref 11.5–15.5)
WBC: 10.8 10*3/uL — ABNORMAL HIGH (ref 4.0–10.5)
nRBC: 0 % (ref 0.0–0.2)

## 2019-05-02 LAB — BASIC METABOLIC PANEL
Anion gap: 6 (ref 5–15)
BUN: 8 mg/dL (ref 6–20)
CO2: 34 mmol/L — ABNORMAL HIGH (ref 22–32)
Calcium: 7.9 mg/dL — ABNORMAL LOW (ref 8.9–10.3)
Chloride: 99 mmol/L (ref 98–111)
Creatinine, Ser: <0.3 mg/dL — ABNORMAL LOW (ref 0.61–1.24)
Glucose, Bld: 89 mg/dL (ref 70–99)
Potassium: 3.3 mmol/L — ABNORMAL LOW (ref 3.5–5.1)
Sodium: 139 mmol/L (ref 135–145)

## 2019-05-02 LAB — RESPIRATORY PANEL BY RT PCR (FLU A&B, COVID)
Influenza A by PCR: NEGATIVE
Influenza B by PCR: NEGATIVE
SARS Coronavirus 2 by RT PCR: NEGATIVE

## 2019-05-02 MED ORDER — PROMETHAZINE HCL 25 MG/ML IJ SOLN
25.0000 mg | Freq: Once | INTRAMUSCULAR | Status: AC
  Administered 2019-05-02: 16:00:00 25 mg via INTRAVENOUS
  Filled 2019-05-02: qty 1

## 2019-05-02 MED ORDER — POLYVINYL ALCOHOL 1.4 % OP SOLN
1.0000 [drp] | OPHTHALMIC | Status: DC | PRN
  Filled 2019-05-02: qty 15

## 2019-05-02 MED ORDER — POTASSIUM CHLORIDE CRYS ER 20 MEQ PO TBCR
40.0000 meq | EXTENDED_RELEASE_TABLET | Freq: Every day | ORAL | Status: AC
  Administered 2019-05-03 – 2019-05-04 (×3): 40 meq via ORAL
  Filled 2019-05-02 (×4): qty 2

## 2019-05-02 NOTE — Progress Notes (Signed)
PROGRESS NOTE    [name] [name]  RFF:[id number] DOB: 09/27/1965 DOA: 04/21/2019 PCP: Andria Frames, PA-C   Brief Narrative:  [name] Shartzer Dixonis a 53 y.o.malewith medical history significant ofquadriplegia due to a previous C5 fracture, history of decubitus ulcers, recurrent UTIs, colostomy/suprapubic catheter, chronic hypotension. Patient presented to the ED on 12/6 with complaint of nausea and vomiting. Patient feels similarly as he did with his prior UTI. No fever, had some chills. In the ED, patient had a temp of 99.2, he was tachycardic to 120s, hypotensive to 70s, breathing comfortably on room air.   Work-up showed WBC count of 15, potassium 2.9, normal renal function. Patient was admitted to hospitalist medicine service for sepsis secondary to UTI.  Assessment & Plan:   Active Problems:   PVD   Obesity   Quadriplegia ([place])   Sacral decubitus ulcer, stage IV ([name]   S/P colostomy ([name]   Presence of suprapubic catheter ([name]   OSA on CPAP   Normocytic anemia   Severe protein-calorie malnutrition ([name]   Pressure ulcer of right upper back   Anemia of chronic disease   Hypokalemia   Personal history of other (healed) physical injury and trauma   Multiple wounds   Sacral decubitus ulcer   Quadriplegia, C5-C7, complete ([place])   Sepsis ([place])  Sepsis due to UTI caused by presence of suprapubic catheter. -Met sepsis criteria on admission with elevated white count, hypotension, significant tachycardia -Patient has history of UTI with with multiple drug-resistant bacteria's including VRE, ESBL E. coli as well as Carbapenem resistant Pseudomonas -Antibiotics management per ID, switched from [place] to ceftazidime . -As well there is a concern about bacteremia, blood cultures were done this admission after he received IV antibiotics, recommendation is to remove Port-A-Cath given he did have bacteremia on previous admissions less than 3 weeks. - Consulted to remove Port-A-Cath,  patient currently has midline. -With IV fluids, antibiotics, sepsis parameters improving.  No fever, WBC count improving. -Blood cultures done 12/7, with no growth to date . -Port-A-Cath removed yesterday, PICC line inserted 12/11. Left arm swollen, PICC line removed.12/12 -ID recommends IV ceftazidime 10 days after the port is removed. - picc line inserted.    Nausea/vomiting -intermittent  continue with as needed meds.  Chronic hypotension -Worsened with sepsis, he is chronically on midodrine, he is asymptomatic with these episodes . -With low cortisol level at 6.5 , initially, cosyntropin test has been performed with good response , indication to start on Solu-Cortef . -DIsContinue IV fluid  Sacral pressure ulcer with chronic osteomyelitis POA -Recently hospitalized for this,  found to be bacteremic with Acinetobacter. - continue with wound care.-  -Plastic surgery consult appreciated,   wounds seem to be healing well as per plastic surgery on 12 /17/ 2020.  No further debridement indicated.     Hypokalemia -replete as needed  Hypomagnesemia: Replete as needed  Anemia of chronic disease -Continue iron supplementation, transfuse for hemoglobin less than 7.0 - no active bleeding    Quadriplegia -Continue home medications  Positive COVID-19 test-we will repeat another test as per ID.  Continue isolation precautions until then. Awaiting further input.  cxr shows stable bilateral small effusions and opacities which seems to be stable possibly atelectasis.  Chronic left fifth rib lytic lesion -no acute change.  Outpatient follow-up with PCP  Basilar atelectasis -incentive spirometry  Chronic thyroid nodule-previously biopsied in 2019.  Outpatient follow-up with PCP  Patient has to skilled nursing home acceptances.  Will await input from  ID regarding further management of positive COVID-19 test as per case manager, we might not have an option.  In that case patient is  agreeable to going home with home IV antibiotics.   DVT prophylaxis: Lovenox Code Status: Full  Family Communication: D/W patient Disposition Plan:  2-3 days. Consultants:   Infectious disease  Procedures:  Antimicrobials:  Anti-infectives (From admission, onward)   Start     Dose/Rate Route Frequency Ordered Stop   04/24/19 0200  cefTAZidime (FORTAZ) 1 g in sodium chloride 0.9 % 100 mL IVPB  Status:  Discontinued     1 g 200 mL/hr over 30 Minutes Intravenous Every 8 hours 04/23/19 1930 04/23/19 1932   04/24/19 0200  cefTAZidime (FORTAZ) 2 g in sodium chloride 0.9 % 100 mL IVPB  Status:  Discontinued     2 g 200 mL/hr over 30 Minutes Intravenous Every 8 hours 04/23/19 1932 04/23/19 1934   04/24/19 0200  ceftAZIDime (FORTAZ) 2 gram/50 mL D5W IVPB - DUPLEX  Status:  Discontinued     2 g 100 mL/hr over 30 Minutes Intravenous Every 8 hours 04/23/19 1935 04/23/19 1948   04/24/19 0200  cefTAZidime (FORTAZ) 2 g in sodium chloride 0.9 % 100 mL IVPB     2 g 200 mL/hr over 30 Minutes Intravenous Every 8 hours 04/23/19 1948 05/06/19 2359   04/21/19 1000  ceftazidime-avibactam (AVYCAZ) 2.5 g in dextrose 5 % 50 mL IVPB  Status:  Discontinued     2.5 g 25 mL/hr over 2 Hours Intravenous Every 8 hours 04/21/19 0828 04/23/19 1925   04/21/19 1000  DAPTOmycin (CUBICIN) 500 mg in sodium chloride 0.9 % IVPB  Status:  Discontinued     500 mg 220 mL/hr over 30 Minutes Intravenous Daily 04/21/19 0902 04/25/19 0752   04/21/19 0830  linezolid (ZYVOX) IVPB 600 mg  Status:  Discontinued     600 mg 300 mL/hr over 60 Minutes Intravenous  Once 04/21/19 0826 04/21/19 0829   04/21/19 0630  piperacillin-tazobactam (ZOSYN) IVPB 3.375 g     3.375 g 100 mL/hr over 30 Minutes Intravenous  Once 04/21/19 2130 04/21/19 8657      Subjective: Patient was seen and examined at bedside, denies any complaints.   Patient is eating and drinking fine, no trouble passing urine or stool via colostomy.   Patient is  clinically stable.  Blood pressure somewhat better.  He was complaining of nausea later in the day for which Phenergan has been ordered.  Patient's  Covid test came back positive.  He has been placed in isolation   As per infectious disease advice, repeat Covid test has been ordered as this could be false positive/nosocomial Covid.  I have discussed treatment options with him.  At the moment he is amenable to convalescent plasma.  We will transfuse plasma if deemed by ID that he is a true positive.  He is not a candidate for remdesivir or steroids as he is breathing fine on room air.  He denies any congestion nausea vomiting diarrhea chest pain shortness of breath or palpitations   Objective: Vitals:   05/02/19 1125 05/02/19 1357 05/02/19 1600 05/02/19 1618  BP:  (!) 106/33 105/75   Pulse:  (!) 108 (!) 113 (!) 107  Resp:  19 (!) 28 20  Temp: 98.9 F (37.2 C)     TempSrc: Oral     SpO2:  100% 98% 100%  Weight:      Height:        Intake/Output  Summary (Last 24 hours) at 05/02/2019 1627 Last data filed at 05/02/2019 1410 Gross per 24 hour  Intake 2754.76 ml  Output 4025 ml  Net -1270.24 ml   Filed Weights   04/21/19 0444 04/21/19 1130 04/30/19 0547  Weight: 77.7 kg 68.9 kg 83 kg    Examination:  General exam: Appears calm and comfortable.  Normal affect, frail,  chronically ill-appearing.  Morbidly obese Respiratory system: few crackles , left base no wheezing or rhonchi.Marland Kitchen Respiratory effort normal. Cardiovascular system: S1 & S2 heard, tachycardic no JVD, murmurs, rubs, gallops or clicks. No pedal edema. Gastrointestinal system: Abdomen is nondistended, soft and nontender. No organomegaly or masses felt. Normal bowel sounds heard. Central nervous system: Alert and oriented. No focal neurological deficits. Extremities: No pedal edema, no calf pain, Left arm swollen , non tender. Skin: Patient on multiple pressure ulcers. Psychiatry: Judgement and insight appear normal. Mood &  affect appropriate.     Data Reviewed: I have personally reviewed following labs and imaging studies  CBC: Recent Labs  Lab 04/28/19 1055 04/28/19 2356 04/29/19 0500 04/30/19 0320 05/01/19 0335 05/02/19 0317  WBC 14.6*  --  13.3* 11.6* 10.7* 10.8*  HGB 6.9* 8.0* 7.8* 8.1* 8.0* 8.0*  HCT 22.7* 25.3* 25.4* 25.4* 25.9* 25.9*  MCV 84.1  --  87.3 87.0 88.1 88.7  PLT 298  --  444* 415* 403* 625*   Basic Metabolic Panel: Recent Labs  Lab 04/27/19 0519 04/28/19 1055 04/29/19 0500 04/30/19 0320 05/01/19 0335 05/02/19 0317  NA 141 138 140 137 139 139  K 3.2* 4.7 4.0 3.7 4.0 3.3*  CL 107 106 105 101 101 99  CO2 26 25 30 29 31  34*  GLUCOSE 105* 110* 95 102* 106* 89  BUN 8 8 9 11 10 8   CREATININE <0.30* <0.30* <0.30* <0.30* <0.30* <0.30*  CALCIUM 8.1* 7.9* 7.8* 7.6* 7.8* 7.9*  MG 1.0* 1.2* 1.6*  --   --   --   PHOS 2.7 2.6 3.0  --   --   --    GFR: CrCl cannot be calculated (This lab value cannot be used to calculate CrCl because it is not a number: <0.30). Liver Function Tests: Recent Labs  Lab 04/27/19 0519 04/29/19 0500  AST 15 15  ALT 11 11  ALKPHOS 95 75  BILITOT 0.3 <0.1*  PROT 4.9* 4.5*  ALBUMIN 1.4* 1.4*   No results for input(s): LIPASE, AMYLASE in the last 168 hours. No results for input(s): AMMONIA in the last 168 hours. Coagulation Profile: No results for input(s): INR, PROTIME in the last 168 hours. Cardiac Enzymes: No results for input(s): CKTOTAL, CKMB, CKMBINDEX, TROPONINI in the last 168 hours. BNP (last 3 results) No results for input(s): PROBNP in the last 8760 hours. HbA1C: No results for input(s): HGBA1C in the last 72 hours. CBG: No results for input(s): GLUCAP in the last 168 hours. Lipid Profile: No results for input(s): CHOL, HDL, LDLCALC, TRIG, CHOLHDL, LDLDIRECT in the last 72 hours. Thyroid Function Tests: No results for input(s): TSH, T4TOTAL, FREET4, T3FREE, THYROIDAB in the last 72 hours. Anemia Panel: No results for input(s):  VITAMINB12, FOLATE, FERRITIN, TIBC, IRON, RETICCTPCT in the last 72 hours. Sepsis Labs: No results for input(s): PROCALCITON, LATICACIDVEN in the last 168 hours.  Recent Results (from the past 240 hour(s))  Culture, blood (routine x 2)     Status: None   Collection Time: 04/22/19  7:12 PM   Specimen: BLOOD RIGHT HAND  Result Value Ref Range Status  Specimen Description   Final    BLOOD RIGHT HAND Performed at [facility] [address], [place], [place] [address]    Special Requests   Final    BOTTLES DRAWN AEROBIC ONLY Blood Culture adequate volume Performed at [facility] [address] [place], [place] [address]    Culture   Final    NO GROWTH 5 DAYS Performed at [facility], [place] [address] [place], [place] [address]    Report Status 04/27/2019 FINAL  Final  Culture, blood (routine x 2)     Status: None   Collection Time: 04/22/19  8:48 PM   Specimen: BLOOD  Result Value Ref Range Status   Specimen Description   Final    BLOOD BLOOD RIGHT HAND Performed at [facility] [address], [place], [place] [address]    Special Requests   Final    BOTTLES DRAWN AEROBIC ONLY Blood Culture results may not be optimal due to an inadequate volume of blood received in culture bottles Performed at [facility] [address], [place], [place] [address]    Culture   Final    NO GROWTH 5 DAYS Performed at [facility], [place] [address], [place], [place] [address]    Report Status 04/27/2019 FINAL  Final  Culture, Urine     Status: Abnormal   Collection Time: 04/26/19  7:35 AM   Specimen: Urine, Catheterized  Result Value Ref Range Status   Specimen Description   Final    URINE, CATHETERIZED Performed at [facility] [address] [place], [place] [address]    Special Requests   Final    NONE Performed at [facility], [place] [address]  [place], [place] [address]    Culture (A)  Final    <10,000 COLONIES/mL INSIGNIFICANT GROWTH Performed at [facility] [address], [place],  [address]    Report Status 04/27/2019 FINAL  Final  SARS CORONAVIRUS 2 (TAT 6-24 HRS) Nasopharyngeal Nasopharyngeal Swab     Status: Abnormal   Collection Time: 04/30/19  5:49 PM   Specimen: Nasopharyngeal Swab  Result Value Ref Range Status   SARS Coronavirus 2 POSITIVE (A) NEGATIVE Final    Comment: RESULT CALLED TO, READ BACK BY AND VERIFIED WITH: J HOUSE,RN 0119 05/01/2019 D BRADLEY (NOTE) SARS-CoV-2 target nucleic acids are DETECTED. The SARS-CoV-2 RNA is generally detectable in upper and lower respiratory specimens during the acute phase of infection. Positive results are indicative of the presence of SARS-CoV-2 RNA. Clinical correlation with patient history and other diagnostic information is  necessary to determine patient infection status. Positive results do not rule out bacterial infection or co-infection with other viruses.  The expected result is Negative. Fact Sheet for Patients: SugarRoll.be Fact Sheet for Healthcare Providers: https://www.woods-mathews.com/ This test is not yet approved or cleared by the [place] FDA and  has been authorized for detection and/or diagnosis of SARS-CoV-2 by FDA under an Emergency Use Authorization (EUA). This EUA will remain  in effect (meaning this test can be used) for th e duration of the COVID-19 declaration under Section 564(b)(1) of the Act, 21 U.S.C. section 360bbb-3(b)(1), unless the authorization is terminated or revoked sooner. Performed at [facility], [place] [address], [place],  [address]   Respiratory Panel by RT PCR (Flu A&B, Covid) - Nasopharyngeal Swab     Status: None   Collection Time: 05/01/19  9:37 AM   Specimen: Nasopharyngeal Swab  Result Value Ref Range Status   SARS Coronavirus 2 by RT PCR NEGATIVE  NEGATIVE Final    Comment: (NOTE) SARS-CoV-2 target nucleic acids are NOT DETECTED. The SARS-CoV-2 RNA is generally detectable in upper respiratoy specimens during the acute phase of infection. The lowest concentration of SARS-CoV-2 viral copies this assay can detect is 131 copies/mL. A negative result does not preclude SARS-Cov-2 infection and should not be used as the sole basis for treatment or other patient management decisions. A negative result may occur with  improper specimen collection/handling, submission of specimen other than nasopharyngeal swab, presence of viral mutation(s) within the areas targeted by this assay, and inadequate number of viral copies (<131 copies/mL). A negative result must be combined with clinical observations, patient history, and epidemiological information. The expected result is Negative. Fact Sheet for Patients:  PinkCheek.be Fact Sheet for Healthcare Providers:  GravelBags.it This test is not yet ap proved or cleared by the [place] FDA and  has been authorized for detection and/or diagnosis of SARS-CoV-2 by FDA under an Emergency Use Authorization (EUA). This EUA will remain  in effect (meaning this test can be used) for the duration of the COVID-19 declaration under Section 564(b)(1) of the Act, 21 U.S.C. section 360bbb-3(b)(1), unless the authorization is terminated or revoked sooner.    Influenza A by PCR NEGATIVE NEGATIVE Final   Influenza B by PCR NEGATIVE NEGATIVE Final    Comment: (NOTE) The Xpert Xpress SARS-CoV-2/FLU/RSV assay is intended as an aid in  the diagnosis of influenza from Nasopharyngeal swab specimens and  should not be used as a sole basis for treatment. Nasal washings and  aspirates are unacceptable for Xpert Xpress SARS-CoV-2/FLU/RSV  testing. Fact Sheet for Patients: PinkCheek.be Fact Sheet for Healthcare  Providers: GravelBags.it This test is not yet approved or cleared by the [place] FDA and  has been authorized for detection and/or diagnosis of SARS-CoV-2 by  FDA under an Emergency Use Authorization (EUA). This EUA will remain  in effect (meaning this test can be used) for the duration of the  Covid-19 declaration under Section 564(b)(1) of the Act, 21  U.S.C. section 360bbb-3(b)(1), unless the authorization is  terminated or revoked. Performed at [facility], [place] [address], [place],  [address]      Radiology Studies: DG Chest 1 View  Result Date: 05/01/2019 CLINICAL DATA:  COVID-19 positive patient.  Quadriplegia. EXAM: CHEST  1 VIEW COMPARISON:  April 28, 2019 FINDINGS: The right central line is been removed. A right PICC line is been placed, terminating in the central SVC. A layering effusion on the right is again identified with underlying opacity. Mild opacity in the left perihilar and retrocardiac regions are stable. The cardiomediastinal silhouette is stable. No other acute abnormalities. IMPRESSION: 1. A new right PICC line is in good position terminating in the central SVC. 2. Probable small bilateral layering effusions. Opacity underlies the right effusion, stable. Mild left perihilar and retrocardiac opacities are stable as well. Electronically Signed   By: Dorise Bullion III M.D   On: 05/01/2019 14:33   CT CHEST WO CONTRAST  Result Date: 05/02/2019 CLINICAL DATA:  Quadriplegia, prior C5 fracture, pneumonia, pleural effusion, tachycardia, leukocytosis, sepsis EXAM: CT CHEST WITHOUT CONTRAST TECHNIQUE: Multidetector CT imaging of the chest was performed following the standard protocol without IV contrast. Sagittal and coronal MPR images reconstructed from axial data set. COMPARISON:  None FINDINGS: Cardiovascular: Minimal enlargement of cardiac silhouette and  minimal pericardial effusion. Ascending thoracic aorta  upper normal caliber 3.7 cm diameter. Minimal atherosclerotic calcification aorta. Mediastinum/Nodes: Questionable wall thickening of the mid to distal esophagus cannot exclude esophagitis or reflux. LEFT thyroid nodule 2.0 x 1.6 cm. Base of cervical region otherwise normal appearance. No thoracic adenopathy. Few normal size mediastinal lymph nodes seen. Lungs/Pleura: Small LEFT pleural effusion. Partially loculated small RIGHT pleural effusion. Compressive atelectasis of the lower lobes cannot exclude underlying consolidation. Minimal compressive atelectasis of the posterior RIGHT upper lobe. Remaining lungs clear. No pneumothorax or definite pulmonary mass. Upper Abdomen: Visualized upper abdomen unremarkable Musculoskeletal: Diffuse osseous demineralization. Sternum and spine unremarkable. Expansile destructive bone lesion again identified at the posterior RIGHT fifth rib at 5.9 x 4.2 x. 4.6 cm not significantly changed. Old LEFT rib fractures. Question old RIGHT rib fractures as well. Degenerative changes of the spine with postsurgical changes at C5. IMPRESSION: Small BILATERAL pleural effusions, partially loculated on LEFT. Associated compressive atelectasis of the posterior lungs greatest in lower lobes, cannot exclude underlying consolidation especially in RIGHT lower lobe. Stable expansile bone lesion of the posterior RIGHT fifth rib. LEFT thyroid nodule, previously assessed by thyroid ultrasound and biopsy in 2019, recommend correlation with prior imaging and cytology results. Minimal pericardial effusion. Aortic Atherosclerosis (ICD10-I70.0). Electronically Signed   By: Lavonia Dana M.D.   On: 05/02/2019 11:40     Scheduled Meds: . acidophilus  1 capsule Oral Daily  . baclofen  20 mg Oral BID  . Chlorhexidine Gluconate Cloth  6 each Topical Daily  . enoxaparin (LOVENOX) injection  40 mg Subcutaneous Daily  . ferrous sulfate  325 mg Oral Q breakfast  . fesoterodine  8 mg Oral q morning - 10a  .  lactose free nutrition  237 mL Oral TID WC  . magnesium oxide  400 mg Oral Q breakfast  . metoCLOPramide  10 mg Oral TID WC  . midodrine  10 mg Oral TID WC  . multivitamin with minerals  1 tablet Oral Daily  . pantoprazole  40 mg Oral BID  . sodium chloride flush  10-40 mL Intracatheter Q12H  . sodium chloride flush  10-40 mL Intracatheter Q12H  . sucralfate  1 g Oral TID WC & HS  . vitamin C  500 mg Oral Daily  . zinc sulfate  220 mg Oral Daily   Continuous Infusions: . ceftAZIDime (FORTAZ) IVPB 2 gram/100 mL NS (Mini-Bag Plus) Stopped (05/02/19 1400)  . lactated ringers 75 mL/hr at 05/02/19 1410     LOS: 11 days    Time spent:     Vicenta Dunning, MD [facility]   If 7PM-7AM, please contact night-coverage

## 2019-05-02 NOTE — Progress Notes (Signed)
Subjective: [name] [name] is resting in bed today on evaluation. No complaints at this time.  In regards to his wounds, he has had BID changes of WTD dressings.   He reports prior to hospitalization, he has been receiving assistance from family and wound care office.  Denies fever, chills.  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 Incision/Wound: He has multiple pressure wounds, currently with WTD dressings in place.   Right anterior hip with some mild eschar noted, but overall a very healthy appearance with good granulation tissue.   Left hip wound with good granulation tissue noted, ~ 4 x 4 cm deeper wound within this wound that has good granulation tissue, no eschar noted, no scabbing noted, no purulence or foul drainage noted.  Right abdomen wound with new epithelium surrounding the wound, the edges appear healthy with some exudate noted centrally, no eschar noted, no purulence. Non erythematous peri-wound area.  Sacral/buttock/lower back pressure wound/ulcer with good granulation tisue and fresh pink epithelium noted. Overall, there is good granulation tissue, no purulence  noted, peri-wound area is non-erythematous.  Right side/back - overall healing well, good granulation tissue noted. Some exudate/eschar build up noted one some areas.   Lab Results:  CBC Latest Ref Rng & Units 05/02/2019 05/01/2019 04/30/2019  WBC 4.0 - 10.5 K/uL 10.8(H) 10.7(H) 11.6(H)  Hemoglobin 13.0 - 17.0 g/dL 8.0(L) 8.0(L) 8.1(L)  Hematocrit 39.0 - 52.0 % 25.9(L) 25.9(L) 25.4(L)  Platelets 150 - 400 K/uL 414(H) 403(H) 415(H)    BMET Recent Labs    05/01/19 0335 05/02/19 0317  NA 139 139  K 4.0 3.3*  CL 101 99  CO2 31 34*  GLUCOSE 106* 89  BUN 10 8  CREATININE <0.30* <0.30*  CALCIUM 7.8* 7.9*   PT/INR No results for input(s): LABPROT, INR in the last 72 hours. ABG No results for input(s): PHART, HCO3 in the last 72 hours.  Invalid input(s): PCO2, PO2  Studies/Results: DG Chest 1 View  Result Date: 05/01/2019 CLINICAL DATA:  COVID-19 positive patient.  Quadriplegia. EXAM: CHEST  1 VIEW COMPARISON:  April 28, 2019 FINDINGS: The right central line is been removed. A right PICC line is been placed, terminating in the central SVC. A layering effusion on the right is again identified with underlying opacity. Mild opacity in the left perihilar and retrocardiac regions are stable. The cardiomediastinal silhouette is stable. No other acute abnormalities. IMPRESSION: 1. A new right PICC line is in good position terminating in the central SVC. 2. Probable small bilateral layering effusions. Opacity underlies the right effusion, stable. Mild left perihilar and retrocardiac opacities are stable as well. Electronically Signed   By: Dorise Bullion III M.D   On: 05/01/2019 14:33   CT CHEST WO CONTRAST  Result Date: 05/02/2019 CLINICAL DATA:  Quadriplegia, prior C5 fracture, pneumonia, pleural effusion, tachycardia, leukocytosis, sepsis EXAM: CT CHEST WITHOUT CONTRAST TECHNIQUE: Multidetector CT imaging of the chest was performed following the standard protocol without IV contrast.  Sagittal and coronal MPR images reconstructed from axial data set. COMPARISON:  None FINDINGS: Cardiovascular: Minimal enlargement of cardiac silhouette and minimal pericardial effusion. Ascending thoracic aorta upper normal caliber 3.7 cm diameter. Minimal atherosclerotic calcification aorta. Mediastinum/Nodes: Questionable wall thickening of the mid to distal esophagus cannot exclude esophagitis or reflux. LEFT thyroid nodule 2.0 x 1.6 cm. Base of cervical region otherwise normal appearance. No thoracic adenopathy. Few normal size mediastinal lymph nodes seen. Lungs/Pleura: Small LEFT pleural effusion. Partially loculated small RIGHT pleural effusion. Compressive atelectasis of the lower lobes cannot exclude underlying consolidation. Minimal compressive atelectasis of the posterior RIGHT upper lobe. Remaining lungs clear. No pneumothorax or definite pulmonary mass. Upper Abdomen: Visualized upper abdomen unremarkable Musculoskeletal: Diffuse osseous demineralization. Sternum and spine unremarkable. Expansile destructive bone lesion again identified at the posterior RIGHT fifth rib at 5.9 x 4.2 x. 4.6 cm not significantly changed. Old LEFT rib fractures. Question old RIGHT rib fractures as well. Degenerative changes of the spine with postsurgical changes at C5. IMPRESSION: Small BILATERAL pleural effusions, partially loculated on LEFT. Associated compressive atelectasis of the posterior lungs greatest in lower lobes, cannot exclude underlying consolidation especially in RIGHT lower lobe. Stable expansile bone lesion of the posterior RIGHT fifth rib. LEFT thyroid nodule, previously assessed by thyroid ultrasound and biopsy in 2019, recommend correlation with prior imaging and cytology results. Minimal pericardial effusion. Aortic Atherosclerosis (ICD10-I70.0). Electronically Signed   By: Lavonia Dana M.D.   On: 05/02/2019 11:40    Anti-infectives: Anti-infectives (From admission, onward)   Start     Dose/Rate  Route Frequency Ordered Stop   04/24/19 0200  cefTAZidime (FORTAZ) 1 g in sodium chloride 0.9 % 100 mL IVPB  Status:  Discontinued     1 g 200 mL/hr over 30 Minutes Intravenous Every 8 hours 04/23/19 1930 04/23/19 1932   04/24/19 0200  cefTAZidime (FORTAZ) 2 g in sodium chloride 0.9 % 100 mL IVPB  Status:  Discontinued     2 g 200 mL/hr over 30 Minutes Intravenous Every 8 hours 04/23/19 1932 04/23/19 1934   04/24/19 0200  ceftAZIDime (FORTAZ) 2 gram/50 mL D5W IVPB - DUPLEX  Status:  Discontinued     2 g 100 mL/hr over 30 Minutes Intravenous Every 8 hours 04/23/19 1935 04/23/19 1948   04/24/19 0200  cefTAZidime (FORTAZ) 2 g in sodium chloride 0.9 % 100 mL IVPB     2 g 200 mL/hr over 30 Minutes Intravenous Every 8 hours 04/23/19 1948 05/06/19 2359   04/21/19 1000  ceftazidime-avibactam (AVYCAZ) 2.5 g in dextrose 5 % 50 mL IVPB  Status:  Discontinued     2.5 g 25 mL/hr over 2 Hours Intravenous Every 8 hours 04/21/19 0828 04/23/19 1925   04/21/19 1000  DAPTOmycin (CUBICIN) 500 mg in sodium chloride 0.9 % IVPB  Status:  Discontinued     500 mg 220 mL/hr over 30 Minutes Intravenous Daily 04/21/19 0902 04/25/19 0752   04/21/19 0830  linezolid (ZYVOX) IVPB 600 mg  Status:  Discontinued     600 mg 300 mL/hr over 60 Minutes Intravenous  Once 04/21/19 0826 04/21/19 0829   04/21/19 0630  piperacillin-tazobactam (ZOSYN) IVPB 3.375 g     3.375 g 100 mL/hr over 30 Minutes Intravenous  Once 04/21/19 0627 04/21/19 0823      Assessment/Plan:  Overall,  [name]. Lineberry's wounds appear to be doing well. He has been receiving assistance from his family and wound care center prior to admission to hospital. He has not been going to wound care appointments as frequently because he has been hospitalized a lot in the past few months.  Continue with current wound care regimen. No surgical management planned.  Call with any questions or concerns, appreciate consult.    LOS: 11 days    Charlies Constable,  PA-C 05/02/2019

## 2019-05-02 NOTE — Progress Notes (Signed)
NUTRITION NOTE  RN made RD aware that patient does not like juven and has not been taking it for this reason. Able to talk about Boost Plus with RN and will maintain that order but d/c juven at this time.     [name], MS, RD, LDN, [facility] Inpatient Clinical Dietitian Pager # [phone] After hours/weekend pager # [phone]

## 2019-05-03 DIAGNOSIS — T07XXXA Unspecified multiple injuries, initial encounter: Secondary | ICD-10-CM

## 2019-05-03 LAB — BASIC METABOLIC PANEL
Anion gap: 7 (ref 5–15)
BUN: 9 mg/dL (ref 6–20)
CO2: 31 mmol/L (ref 22–32)
Calcium: 7.6 mg/dL — ABNORMAL LOW (ref 8.9–10.3)
Chloride: 103 mmol/L (ref 98–111)
Creatinine, Ser: <0.3 mg/dL — ABNORMAL LOW (ref 0.61–1.24)
Glucose, Bld: 113 mg/dL — ABNORMAL HIGH (ref 70–99)
Potassium: 3.5 mmol/L (ref 3.5–5.1)
Sodium: 141 mmol/L (ref 135–145)

## 2019-05-03 LAB — CBC
HCT: 24 % — ABNORMAL LOW (ref 39.0–52.0)
Hemoglobin: 7.2 g/dL — ABNORMAL LOW (ref 13.0–17.0)
MCH: 27 pg (ref 26.0–34.0)
MCHC: 30 g/dL (ref 30.0–36.0)
MCV: 89.9 fL (ref 80.0–100.0)
Platelets: 375 10*3/uL (ref 150–400)
RBC: 2.67 MIL/uL — ABNORMAL LOW (ref 4.22–5.81)
RDW: 18.5 % — ABNORMAL HIGH (ref 11.5–15.5)
WBC: 8.9 10*3/uL (ref 4.0–10.5)
nRBC: 0 % (ref 0.0–0.2)

## 2019-05-03 NOTE — TOC Progression Note (Signed)
Transition of Care [facility]) - Progression Note    Patient Details  Name: [name] [name] MRN: [id number] Date of Birth: 08-Jun-1965  Transition of Care [facility] CM/SW Contact  Juvenal Umar, Marjie Skiff, RN Phone Number: 05/03/2019, 3:49 PM  Clinical Narrative:    Bed offers given to pt. Only 2 available. Pt chooses [facility]. Weekend TOC to contact [facility] admission coordinator to check bed availability. If no bed then pt would rather go home. Additional clinicals sent to [facility] to extend auth for the weekend.   Expected Discharge Plan: Skilled Nursing Facility Barriers to Discharge: Continued Medical Work up  Expected Discharge Plan and Services Expected Discharge Plan: [place]   Discharge Planning Services: CM Consult   Living arrangements for the past 2 months: Single Family Home                                       Social Determinants of Health (SDOH) Interventions    Readmission Risk Interventions Readmission Risk Prevention Plan 04/29/2019 04/01/2019 03/27/2019  Transportation Screening Complete Complete Complete  PCP or Specialist Appt within 5-7 Days - - -  PCP or Specialist Appt within 3-5 Days - - -  Not Complete comments - - -  Home Care Screening - - -  Medication Review (RN CM) - - -  HRI or [place] Work Consult for [place] Planning/Counseling - - -  [place] - - -  Medication Review Press photographer) Complete Referral to Pharmacy Complete  PCP or Specialist appointment within 3-5 days of discharge Complete Not Complete Complete  PCP/Specialist Appt Not Complete comments - DC date unknown -  HRI or Home Care Consult Complete Complete Complete  SW Recovery Care/Counseling Consult Complete - Complete  Palliative Care Screening Not Applicable Not Complete Not Applicable  Comments - no desire -  Skilled Nursing Facility Complete Not Applicable Not Applicable  Some recent data might be hidden

## 2019-05-03 NOTE — Progress Notes (Signed)
PROGRESS NOTE    [name] [name]  XFG:[id number] DOB: [birth year] DOA: 04/21/2019 PCP: Andria Frames, PA-C   Brief Narrative:  [name] Geibel Dixonis a 53 y.o.malewith medical history significant ofquadriplegia due to a previous C5 fracture, history of decubitus ulcers, recurrent UTIs, colostomy/suprapubic catheter, chronic hypotension. Patient presented to the ED on 12/6 with complaint of nausea and vomiting. Patient feels similarly as he did with his prior UTI. No fever, had some chills. In the ED, patient had a temp of 99.2, he was tachycardic to 120s, hypotensive to 70s, breathing comfortably on room air.   Work-up showed WBC count of 15, potassium 2.9, normal renal function. Patient was admitted to hospitalist medicine service for sepsis secondary to UTI.  Assessment & Plan:   Active Problems:   PVD   Obesity   Quadriplegia ([place])   Sacral decubitus ulcer, stage IV ([name]   S/P colostomy ([name]   Presence of suprapubic catheter ([name]   OSA on CPAP   Normocytic anemia   Severe protein-calorie malnutrition ([name]   Pressure ulcer of right upper back   Anemia of chronic disease   Hypokalemia   Personal history of other (healed) physical injury and trauma   Multiple wounds   Sacral decubitus ulcer   Quadriplegia, C5-C7, complete ([place])   Sepsis ([place])  Sepsis due to UTI caused by presence of suprapubic catheter. -Met sepsis criteria on admission with elevated white count, hypotension, significant tachycardia -Patient has history of UTI with with multiple drug-resistant bacteria's including VRE, ESBL E. coli as well as Carbapenem resistant Pseudomonas -Antibiotics management per ID, switched from [place] to ceftazidime . -As well there is a concern about bacteremia, blood cultures were done this admission after he received IV antibiotics, recommendation is to remove Port-A-Cath given he did have bacteremia on previous admissions less than 3 weeks. - Consulted to remove Port-A-Cath,  patient currently has midline. -With IV fluids, antibiotics, sepsis parameters improving.  No fever, WBC count improving. -Blood cultures done 12/7, with no growth to date . -Port-A-Cath removed , PICC line inserted 12/11. Left arm swollen, PICC line removed.12/12 -ID recommends IV ceftazidime 10 days after the port is removed which will be till 12/21  - picc line reinserted on rt side    Nausea/vomiting -intermittent  continue with as needed meds.  Chronic hypotension -Worsened with sepsis, he is chronically on midodrine, he is asymptomatic with these episodes . -With low cortisol level at 6.5 , initially, cosyntropin test has been performed with good response , indication to start on Solu-Cortef . -DIsContinue IV fluid  Sacral pressure ulcer with chronic osteomyelitis POA -Recently hospitalized for this,  found to be bacteremic with Acinetobacter. - continue with wound care.-  -Plastic surgery consult appreciated,   wounds seem to be healing well as per plastic surgery on 12 /17/ 2020.  No further debridement indicated.     Hypokalemia -replete as needed  Hypomagnesemia: Replete as needed  Anemia of chronic disease -Continue iron supplementation, transfuse for hemoglobin less than 7.0 - no active bleeding    Quadriplegia -Continue home medications  COVID-19 test-final result deemed as negative, noninfectious.  Contact precautions will be discontinued as per infection control guidelines as informed to me by nursing staff   cxr shows stable bilateral small effusions and opacities which seems to be stable possibly atelectasis.  Chronic left fifth rib lytic lesion -no acute change.  Outpatient follow-up with PCP.  Patient mentions that he is aware and has been told that it is  osteomyelitis, nothing to do.  Basilar atelectasis -incentive spirometry  Chronic thyroid nodule-previously biopsied in 2019.  Outpatient follow-up with PCP.  Patient is aware and mentions that it  was benign.  Patient has two skilled nursing home acceptances.  In case if patient cannot go to any skilled nursing home over the weekend,  patient is agreeable to going home with home IV antibiotics.   DVT prophylaxis: Lovenox Code Status: Full  Family Communication: D/W patient Disposition Plan:  Consultants:   Infectious disease plastic surgery  Procedures:  Antimicrobials:  Anti-infectives (From admission, onward)   Start     Dose/Rate Route Frequency Ordered Stop   04/24/19 0200  cefTAZidime (FORTAZ) 1 g in sodium chloride 0.9 % 100 mL IVPB  Status:  Discontinued     1 g 200 mL/hr over 30 Minutes Intravenous Every 8 hours 04/23/19 1930 04/23/19 1932   04/24/19 0200  cefTAZidime (FORTAZ) 2 g in sodium chloride 0.9 % 100 mL IVPB  Status:  Discontinued     2 g 200 mL/hr over 30 Minutes Intravenous Every 8 hours 04/23/19 1932 04/23/19 1934   04/24/19 0200  ceftAZIDime (FORTAZ) 2 gram/50 mL D5W IVPB - DUPLEX  Status:  Discontinued     2 g 100 mL/hr over 30 Minutes Intravenous Every 8 hours 04/23/19 1935 04/23/19 1948   04/24/19 0200  cefTAZidime (FORTAZ) 2 g in sodium chloride 0.9 % 100 mL IVPB     2 g 200 mL/hr over 30 Minutes Intravenous Every 8 hours 04/23/19 1948 05/06/19 2359   04/21/19 1000  ceftazidime-avibactam (AVYCAZ) 2.5 g in dextrose 5 % 50 mL IVPB  Status:  Discontinued     2.5 g 25 mL/hr over 2 Hours Intravenous Every 8 hours 04/21/19 0828 04/23/19 1925   04/21/19 1000  DAPTOmycin (CUBICIN) 500 mg in sodium chloride 0.9 % IVPB  Status:  Discontinued     500 mg 220 mL/hr over 30 Minutes Intravenous Daily 04/21/19 0902 04/25/19 0752   04/21/19 0830  linezolid (ZYVOX) IVPB 600 mg  Status:  Discontinued     600 mg 300 mL/hr over 60 Minutes Intravenous  Once 04/21/19 0826 04/21/19 0829   04/21/19 0630  piperacillin-tazobactam (ZOSYN) IVPB 3.375 g     3.375 g 100 mL/hr over 30 Minutes Intravenous  Once 04/21/19 9622 04/21/19 2979      Subjective: Patient was  seen and examined at bedside, denies any complaints.   Patient is eating and drinking fine, no trouble passing urine or stool via colostomy.   Patient is clinically stable.  Patient vitals are stable today.  Hemoglobin is slightly low, however is around baseline.  COVID-19 test is deemed negative as per infection control  Objective: Vitals:   05/03/19 0000 05/03/19 0400 05/03/19 0800 05/03/19 1200  BP: 97/60 116/75 122/81 98/65  Pulse: (!) 102 (!) 106 100 (!) 105  Resp: 16 12 17 14   Temp:   98.8 F (37.1 C) 98.5 F (36.9 C)  TempSrc:   Oral Axillary  SpO2: 96% 97% 98% 98%  Weight:      Height:        Intake/Output Summary (Last 24 hours) at 05/03/2019 1637 Last data filed at 05/03/2019 1425 Gross per 24 hour  Intake 2935.09 ml  Output 2901 ml  Net 34.09 ml   Filed Weights   04/21/19 0444 04/21/19 1130 04/30/19 0547  Weight: 77.7 kg 68.9 kg 83 kg    Examination:  General exam: Appears calm and comfortable.  Normal affect, frail,  chronically ill-appearing.  Morbidly obese Respiratory system: few crackles , left base no wheezing or rhonchi.Marland Kitchen Respiratory effort normal. Cardiovascular system: S1 & S2 heard, tachycardic no JVD, murmurs, rubs, gallops or clicks. No pedal edema. Gastrointestinal system: Abdomen is nondistended, soft and nontender. No organomegaly or masses felt. Normal bowel sounds heard. Central nervous system: Alert and oriented. quadruplegic chronically.  Extremities: No pedal edema, no calf pain, Left arm slightly swollen , non tender. Skin: Patient has multiple pressure ulcers. Psychiatry: Judgement and insight appear normal. Mood & affect appropriate.   Sacral decub ulcers, foot decub ulcers   Data Reviewed: I have personally reviewed following labs and imaging studies  CBC: Recent Labs  Lab 04/29/19 0500 04/30/19 0320 05/01/19 0335 05/02/19 0317 05/03/19 0224  WBC 13.3* 11.6* 10.7* 10.8* 8.9  HGB 7.8* 8.1* 8.0* 8.0* 7.2*  HCT 25.4* 25.4*  25.9* 25.9* 24.0*  MCV 87.3 87.0 88.1 88.7 89.9  PLT 444* 415* 403* 414* 962   Basic Metabolic Panel: Recent Labs  Lab 04/27/19 0519 04/28/19 1055 04/29/19 0500 04/30/19 0320 05/01/19 0335 05/02/19 0317 05/03/19 0224  NA 141 138 140 137 139 139 141  K 3.2* 4.7 4.0 3.7 4.0 3.3* 3.5  CL 107 106 105 101 101 99 103  CO2 26 25 30 29 31  34* 31  GLUCOSE 105* 110* 95 102* 106* 89 113*  BUN 8 8 9 11 10 8 9   CREATININE <0.30* <0.30* <0.30* <0.30* <0.30* <0.30* <0.30*  CALCIUM 8.1* 7.9* 7.8* 7.6* 7.8* 7.9* 7.6*  MG 1.0* 1.2* 1.6*  --   --   --   --   PHOS 2.7 2.6 3.0  --   --   --   --    GFR: CrCl cannot be calculated (This lab value cannot be used to calculate CrCl because it is not a number: <0.30). Liver Function Tests: Recent Labs  Lab 04/27/19 0519 04/29/19 0500  AST 15 15  ALT 11 11  ALKPHOS 95 75  BILITOT 0.3 <0.1*  PROT 4.9* 4.5*  ALBUMIN 1.4* 1.4*   No results for input(s): LIPASE, AMYLASE in the last 168 hours. No results for input(s): AMMONIA in the last 168 hours. Coagulation Profile: No results for input(s): INR, PROTIME in the last 168 hours. Cardiac Enzymes: No results for input(s): CKTOTAL, CKMB, CKMBINDEX, TROPONINI in the last 168 hours. BNP (last 3 results) No results for input(s): PROBNP in the last 8760 hours. HbA1C: No results for input(s): HGBA1C in the last 72 hours. CBG: No results for input(s): GLUCAP in the last 168 hours. Lipid Profile: No results for input(s): CHOL, HDL, LDLCALC, TRIG, CHOLHDL, LDLDIRECT in the last 72 hours. Thyroid Function Tests: No results for input(s): TSH, T4TOTAL, FREET4, T3FREE, THYROIDAB in the last 72 hours. Anemia Panel: No results for input(s): VITAMINB12, FOLATE, FERRITIN, TIBC, IRON, RETICCTPCT in the last 72 hours. Sepsis Labs: No results for input(s): PROCALCITON, LATICACIDVEN in the last 168 hours.  Recent Results (from the past 240 hour(s))  Culture, Urine     Status: Abnormal   Collection Time:  04/26/19  7:35 AM   Specimen: Urine, Catheterized  Result Value Ref Range Status   Specimen Description   Final    URINE, CATHETERIZED Performed at [facility] [address] [place], [place] [address]    Special Requests   Final    NONE Performed at [facility], [place] [address] [place], [place] [address]    Culture (A)  Final    <  10,000 COLONIES/mL INSIGNIFICANT GROWTH Performed at [facility], [place] [address] [place], [place] [address]    Report Status 04/27/2019 FINAL  Final  SARS CORONAVIRUS 2 (TAT 6-24 HRS) Nasopharyngeal Nasopharyngeal Swab     Status: Abnormal   Collection Time: 04/30/19  5:49 PM   Specimen: Nasopharyngeal Swab  Result Value Ref Range Status   SARS Coronavirus 2 POSITIVE (A) NEGATIVE Final    Comment: RESULT CALLED TO, READ BACK BY AND VERIFIED WITH: J HOUSE,RN 0119 05/01/2019 D BRADLEY (NOTE) SARS-CoV-2 target nucleic acids are DETECTED. The SARS-CoV-2 RNA is generally detectable in upper and lower respiratory specimens during the acute phase of infection. Positive results are indicative of the presence of SARS-CoV-2 RNA. Clinical correlation with patient history and other diagnostic information is  necessary to determine patient infection status. Positive results do not rule out bacterial infection or co-infection with other viruses.  The expected result is Negative. Fact Sheet for Patients: SugarRoll.be Fact Sheet for Healthcare Providers: https://www.woods-mathews.com/ This test is not yet approved or cleared by the [place] FDA and  has been authorized for detection and/or diagnosis of SARS-CoV-2 by FDA under an Emergency Use Authorization (EUA). This EUA will remain  in effect (meaning this test can be used) for th e duration of the COVID-19 declaration under Section 564(b)(1) of the Act, 21 U.S.C. section 360bbb-3(b)(1), unless the authorization is  terminated or revoked sooner. Performed at [facility], [place] [address], [place], [place] [address]   Respiratory Panel by RT PCR (Flu A&B, Covid) - Nasopharyngeal Swab     Status: None   Collection Time: 05/01/19  9:37 AM   Specimen: Nasopharyngeal Swab  Result Value Ref Range Status   SARS Coronavirus 2 by RT PCR NEGATIVE NEGATIVE Final    Comment: (NOTE) SARS-CoV-2 target nucleic acids are NOT DETECTED. The SARS-CoV-2 RNA is generally detectable in upper respiratoy specimens during the acute phase of infection. The lowest concentration of SARS-CoV-2 viral copies this assay can detect is 131 copies/mL. A negative result does not preclude SARS-Cov-2 infection and should not be used as the sole basis for treatment or other patient management decisions. A negative result may occur with  improper specimen collection/handling, submission of specimen other than nasopharyngeal swab, presence of viral mutation(s) within the areas targeted by this assay, and inadequate number of viral copies (<131 copies/mL). A negative result must be combined with clinical observations, patient history, and epidemiological information. The expected result is Negative. Fact Sheet for Patients:  PinkCheek.be Fact Sheet for Healthcare Providers:  GravelBags.it This test is not yet ap proved or cleared by the [place] FDA and  has been authorized for detection and/or diagnosis of SARS-CoV-2 by FDA under an Emergency Use Authorization (EUA). This EUA will remain  in effect (meaning this test can be used) for the duration of the COVID-19 declaration under Section 564(b)(1) of the Act, 21 U.S.C. section 360bbb-3(b)(1), unless the authorization is terminated or revoked sooner. The SARS-CoV-2 RNA is generally detectable in upper respiratoy specimens during the acute phase of infection. The lowest concentration of SARS-CoV-2 viral copies this  assay can detect is 131 copies/mL. A negative result does not preclude SARS-Cov-2 infection and should not be used as the sole basis for treatment or other patient management decisions. A negative result may occur with  improper specimen collection/handling, submission of specimen other than nasopharyngeal swab, presence of viral mutation(s) within the areas targeted by this assay, and inadequate number of viral c opies (<  131 copies/mL). A negative result must be combined with clinical observations, patient history, and epidemiological information. The expected result is Negative. Fact Sheet for Patients:  PinkCheek.be Fact Sheet for Healthcare Providers:  GravelBags.it This test is not yet approved or cleared by the [place] FDA and  has been authorized for detection and/or diagnosis of SARS-CoV-2 by FDA under an Emergency Use Authorization (EUA). This EUA will remain  in effect (meaning this test can be used) for the duration of the COVID-19 declaration under Section 564(b)(1) of the Act, 21 U.S.C. section 360bbb-3(b)(1), unless the authorization is terminated or revoked sooner.    Influenza A by PCR NEGATIVE NEGATIVE Final   Influenza B by PCR NEGATIVE NEGATIVE Final    Comment: (NOTE) The Xpert Xpress SARS-CoV-2/FLU/RSV assay is intended as an aid in  the diagnosis of influenza from Nasopharyngeal swab specimens and  should not be used as a sole basis for treatment. Nasal washings and  aspirates are unacceptable for Xpert Xpress SARS-CoV-2/FLU/RSV  testing. Fact Sheet for Patients: PinkCheek.be Fact Sheet for Healthcare Providers: GravelBags.it This test is not yet approved or cleared by the [place] FDA and  has been authorized for detection and/or diagnosis of SARS-CoV-2 by  FDA under an Emergency Use Authorization (EUA). This EUA will remain  in  effect (meaning this test can be used) for the duration of the  Covid-19 declaration under Section 564(b)(1) of the Act, 21  U.S.C. section 360bbb-3(b)(1), unless the authorization is  terminated or revoked. Performed at [facility], [place] [address] [place], [place] [address]      Radiology Studies: CT CHEST WO CONTRAST  Result Date: 05/02/2019 CLINICAL DATA:  Quadriplegia, prior C5 fracture, pneumonia, pleural effusion, tachycardia, leukocytosis, sepsis EXAM: CT CHEST WITHOUT CONTRAST TECHNIQUE: Multidetector CT imaging of the chest was performed following the standard protocol without IV contrast. Sagittal and coronal MPR images reconstructed from axial data set. COMPARISON:  None FINDINGS: Cardiovascular: Minimal enlargement of cardiac silhouette and minimal pericardial effusion. Ascending thoracic aorta upper normal caliber 3.7 cm diameter. Minimal atherosclerotic calcification aorta. Mediastinum/Nodes: Questionable wall thickening of the mid to distal esophagus cannot exclude esophagitis or reflux. LEFT thyroid nodule 2.0 x 1.6 cm. Base of cervical region otherwise normal appearance. No thoracic adenopathy. Few normal size mediastinal lymph nodes seen. Lungs/Pleura: Small LEFT pleural effusion. Partially loculated small RIGHT pleural effusion. Compressive atelectasis of the lower lobes cannot exclude underlying consolidation. Minimal compressive atelectasis of the posterior RIGHT upper lobe. Remaining lungs clear. No pneumothorax or definite pulmonary mass. Upper Abdomen: Visualized upper abdomen unremarkable Musculoskeletal: Diffuse osseous demineralization. Sternum and spine unremarkable. Expansile destructive bone lesion again identified at the posterior RIGHT fifth rib at 5.9 x 4.2 x. 4.6 cm not significantly changed. Old LEFT rib fractures. Question old RIGHT rib fractures as well. Degenerative changes of the spine with postsurgical changes at C5. IMPRESSION: Small  BILATERAL pleural effusions, partially loculated on LEFT. Associated compressive atelectasis of the posterior lungs greatest in lower lobes, cannot exclude underlying consolidation especially in RIGHT lower lobe. Stable expansile bone lesion of the posterior RIGHT fifth rib. LEFT thyroid nodule, previously assessed by thyroid ultrasound and biopsy in 2019, recommend correlation with prior imaging and cytology results. Minimal pericardial effusion. Aortic Atherosclerosis (ICD10-I70.0). Electronically Signed   By: Lavonia Dana M.D.   On: 05/02/2019 11:40     Scheduled Meds: . acidophilus  1 capsule Oral Daily  . baclofen  20 mg Oral BID  . Chlorhexidine Gluconate Cloth  6  each Topical Daily  . enoxaparin (LOVENOX) injection  40 mg Subcutaneous Daily  . ferrous sulfate  325 mg Oral Q breakfast  . fesoterodine  8 mg Oral q morning - 10a  . lactose free nutrition  237 mL Oral TID WC  . magnesium oxide  400 mg Oral Q breakfast  . metoCLOPramide  10 mg Oral TID WC  . midodrine  10 mg Oral TID WC  . multivitamin with minerals  1 tablet Oral Daily  . pantoprazole  40 mg Oral BID  . potassium chloride  40 mEq Oral Daily  . sodium chloride flush  10-40 mL Intracatheter Q12H  . sodium chloride flush  10-40 mL Intracatheter Q12H  . sucralfate  1 g Oral TID WC & HS  . vitamin C  500 mg Oral Daily  . zinc sulfate  220 mg Oral Daily   Continuous Infusions: . ceftAZIDime (FORTAZ) IVPB 2 gram/100 mL NS (Mini-Bag Plus) Stopped (05/03/19 1349)  . lactated ringers 75 mL/hr at 05/03/19 1630     LOS: 12 days    Time spent:     Vicenta Dunning, MD [facility]   If 7PM-7AM, please contact night-coverage

## 2019-05-04 LAB — BASIC METABOLIC PANEL
Anion gap: 7 (ref 5–15)
BUN: 11 mg/dL (ref 6–20)
CO2: 32 mmol/L (ref 22–32)
Calcium: 7.9 mg/dL — ABNORMAL LOW (ref 8.9–10.3)
Chloride: 99 mmol/L (ref 98–111)
Creatinine, Ser: <0.3 mg/dL — ABNORMAL LOW (ref 0.61–1.24)
Glucose, Bld: 81 mg/dL (ref 70–99)
Potassium: 3.8 mmol/L (ref 3.5–5.1)
Sodium: 138 mmol/L (ref 135–145)

## 2019-05-04 LAB — CBC
HCT: 25.8 % — ABNORMAL LOW (ref 39.0–52.0)
Hemoglobin: 7.8 g/dL — ABNORMAL LOW (ref 13.0–17.0)
MCH: 27 pg (ref 26.0–34.0)
MCHC: 30.2 g/dL (ref 30.0–36.0)
MCV: 89.3 fL (ref 80.0–100.0)
Platelets: 422 10*3/uL — ABNORMAL HIGH (ref 150–400)
RBC: 2.89 MIL/uL — ABNORMAL LOW (ref 4.22–5.81)
RDW: 18.5 % — ABNORMAL HIGH (ref 11.5–15.5)
WBC: 9.7 10*3/uL (ref 4.0–10.5)
nRBC: 0 % (ref 0.0–0.2)

## 2019-05-04 MED ORDER — SIMETHICONE 80 MG PO CHEW
80.0000 mg | CHEWABLE_TABLET | Freq: Four times a day (QID) | ORAL | Status: DC | PRN
  Administered 2019-05-04: 80 mg via ORAL
  Filled 2019-05-04: qty 1

## 2019-05-04 MED ORDER — PROMETHAZINE HCL 25 MG/ML IJ SOLN
6.2500 mg | Freq: Four times a day (QID) | INTRAMUSCULAR | Status: DC | PRN
  Administered 2019-05-04 – 2019-05-05 (×2): 6.25 mg via INTRAVENOUS
  Filled 2019-05-04 (×2): qty 1

## 2019-05-04 NOTE — Progress Notes (Signed)
PROGRESS NOTE    [name] [name]  TWS:[id number] DOB: [birth year] DOA: 04/21/2019 PCP: [name] Frames, PA-C   Brief Narrative:  [name] Friedel Dixonis a 53 y.o.malewith medical history significant ofquadriplegia due to a previous C5 fracture, history of decubitus ulcers, recurrent UTIs, colostomy/suprapubic catheter, chronic hypotension. Patient presented to the ED on 12/6 with complaint of nausea and vomiting. Patient feels similarly as he did with his prior UTI. No fever, had some chills. In the ED, patient had a temp of 99.2, he was tachycardic to 120s, hypotensive to 70s, breathing comfortably on room air.   Work-up showed WBC count of 15, potassium 2.9, normal renal function. Patient was admitted to hospitalist medicine service for sepsis secondary to UTI.  05/04/2019: Patient seen.  Intermittent episodes of soft systolic blood pressure and noted.  Patient is currently on midodrine.  Patient is awaiting discharge to skilled nursing facility to complete course of antibiotics.  PICC line is in place.  Potassium today is 3.8.  Initial urine culture done on 04/21/2019 grew multiple species.  Repeat urine culture done on 04/26/2019 grew less than 10,000 colonies per mL.   Assessment & Plan:   Active Problems:   PVD   Obesity   Quadriplegia ([name])   Sacral decubitus ulcer, stage IV ([name]   S/P colostomy ([name]   Presence of suprapubic catheter ([name]   OSA on CPAP   Normocytic anemia   Severe protein-calorie malnutrition ([name]   Pressure ulcer of right upper back   Anemia of chronic disease   Hypokalemia   Personal history of other (healed) physical injury and trauma   Multiple wounds   Sacral decubitus ulcer   Quadriplegia, C5-C7, complete ([name])   Sepsis ([place])  Sepsis due to UTI caused by presence of suprapubic catheter. -Met sepsis criteria on admission with elevated white count, hypotension, significant tachycardia -Patient has history of UTI with with multiple drug-resistant  bacteria's including VRE, ESBL E. coli as well as Carbapenem resistant Pseudomonas -Antibiotics management per ID, switched from [place] to ceftazidime . -As well there is a concern about bacteremia, blood cultures were done this admission after he received IV antibiotics, recommendation is to remove Port-A-Cath given he did have bacteremia on previous admissions less than 3 weeks. - Consulted to remove Port-A-Cath, patient currently has midline. -With IV fluids, antibiotics, sepsis parameters improving.  No fever, WBC count improving. -Blood cultures done 12/7, with no growth to date . -Port-A-Cath removed , PICC line inserted 12/11. Left arm swollen, PICC line removed.12/12 -ID recommends IV ceftazidime 10 days after the port is removed which will be till 12/21  - picc line reinserted on rt side 05/04/2019: Kindly see above.  Complete course of antibiotics.   Nausea/vomiting -intermittent  continue with as needed meds.  Chronic hypotension -Worsened with sepsis, he is chronically on midodrine, he is asymptomatic with these episodes . -With low cortisol level at 6.5 , initially, cosyntropin test has been performed with good response , indication to start on Solu-Cortef . -DIsContinue IV fluid 05/04/2019: Continue midodrine.  Patient may run soft systolic blood pressure chronically.  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 - continue with wound care.-  -Plastic surgery consult appreciated,   wounds seem to be healing well as per plastic surgery on 12 /17/ 2020.  No further debridement indicated.     Hypokalemia -replete as needed 05/04/2019: Resolved.  Hypomagnesemia: Replete as needed 05/04/2019: No recent magnesium level.  Will repeat magnesium level in the morning.  Anemia of chronic disease -Continue iron supplementation, transfuse for hemoglobin less than 7.0 - no active bleeding     Quadriplegia -Continue home medications  COVID-19 test-final result deemed as negative, noninfectious.  Contact precautions will be discontinued as per infection control guidelines as informed to me by nursing staff   cxr shows stable bilateral small effusions and opacities which seems to be stable possibly atelectasis.  Chronic left fifth rib lytic lesion -no acute change.  Outpatient follow-up with PCP.  Patient mentions that he is aware and has been told that it is osteomyelitis, nothing to do.  Basilar atelectasis -incentive spirometry  Chronic thyroid nodule-previously biopsied in 2019.  Outpatient follow-up with PCP.  Patient is aware and mentions that it was benign.  Patient has two skilled nursing home acceptances.  In case if patient cannot go to any skilled nursing home over the weekend,  patient is agreeable to going home with home IV antibiotics.   DVT prophylaxis: Lovenox Code Status: Full  Family Communication: D/W patient Disposition Plan:  Consultants:   Infectious disease plastic surgery  Procedures:  Antimicrobials:  Anti-infectives (From admission, onward)   Start     Dose/Rate Route Frequency Ordered Stop   04/24/19 0200  cefTAZidime (FORTAZ) 1 g in sodium chloride 0.9 % 100 mL IVPB  Status:  Discontinued     1 g 200 mL/hr over 30 Minutes Intravenous Every 8 hours 04/23/19 1930 04/23/19 1932   04/24/19 0200  cefTAZidime (FORTAZ) 2 g in sodium chloride 0.9 % 100 mL IVPB  Status:  Discontinued     2 g 200 mL/hr over 30 Minutes Intravenous Every 8 hours 04/23/19 1932 04/23/19 1934   04/24/19 0200  ceftAZIDime (FORTAZ) 2 gram/50 mL D5W IVPB - DUPLEX  Status:  Discontinued     2 g 100 mL/hr over 30 Minutes Intravenous Every 8 hours 04/23/19 1935 04/23/19 1948   04/24/19 0200  cefTAZidime (FORTAZ) 2 g in sodium chloride 0.9 % 100 mL IVPB     2 g 200 mL/hr over 30 Minutes Intravenous Every 8 hours 04/23/19 1948 05/06/19 2359   04/21/19 1000   ceftazidime-avibactam (AVYCAZ) 2.5 g in dextrose 5 % 50 mL IVPB  Status:  Discontinued     2.5 g 25 mL/hr over 2 Hours Intravenous Every 8 hours 04/21/19 0828 04/23/19 1925   04/21/19 1000  DAPTOmycin (CUBICIN) 500 mg in sodium chloride 0.9 % IVPB  Status:  Discontinued     500 mg 220 mL/hr over 30 Minutes Intravenous Daily 04/21/19 0902 04/25/19 0752   04/21/19 0830  linezolid (ZYVOX) IVPB 600 mg  Status:  Discontinued     600 mg 300 mL/hr over 60 Minutes Intravenous  Once 04/21/19 0826 04/21/19 0829   04/21/19 0630  piperacillin-tazobactam (ZOSYN) IVPB 3.375 g     3.375 g 100 mL/hr over 30 Minutes Intravenous  Once 04/21/19 4492 04/21/19 0823      Subjective: Patient seen. [name]. No fever or chills. No chest pain or shortness of breath  COVID-19 test is deemed negative as per infection control  Objective: Vitals:   05/04/19 0400 05/04/19 0500 05/04/19 0600 05/04/19 0900  BP: 110/79     Pulse: (!) 104 (!) 104 (!) 110 (!) 105  Resp: 13 13 (!) 24 19  Temp: 98.6 F (37 C)     TempSrc: Oral     SpO2: 97% 98% 100% 100%  Weight:      Height:  Intake/Output Summary (Last 24 hours) at 05/04/2019 1018 Last data filed at 05/04/2019 0900 Gross per 24 hour  Intake 2994.03 ml  Output 3851 ml  Net -856.97 ml   Filed Weights   04/21/19 0444 04/21/19 1130 04/30/19 0547  Weight: 77.7 kg 68.9 kg 83 kg    Examination:  General exam: Appears calm and comfortable.   Respiratory system: Good air entry anteriorly. Cardiovascular system: S1 & S2 heard Gastrointestinal system: Abdomen is soft and nontender.  Patient has colostomy bag.  Organs are difficult to assess. Central nervous system: Alert and oriented. quadruplegic chronically.  Extremities: Functionally quadriplegic.  Mild edema of the extremities. Sacral decub ulcers, foot decub ulcers   Data Reviewed: I have personally reviewed following labs and imaging studies  CBC: Recent Labs  Lab 04/30/19 0320  05/01/19 0335 05/02/19 0317 05/03/19 0224 05/04/19 0456  WBC 11.6* 10.7* 10.8* 8.9 9.7  HGB 8.1* 8.0* 8.0* 7.2* 7.8*  HCT 25.4* 25.9* 25.9* 24.0* 25.8*  MCV 87.0 88.1 88.7 89.9 89.3  PLT 415* 403* 414* 375 341*   Basic Metabolic Panel: Recent Labs  Lab 04/28/19 1055 04/29/19 0500 04/30/19 0320 05/01/19 0335 05/02/19 0317 05/03/19 0224 05/04/19 0456  NA 138 140 137 139 139 141 138  K 4.7 4.0 3.7 4.0 3.3* 3.5 3.8  CL 106 105 101 101 99 103 99  CO2 _0 34* 31 32  GLUCOSE 110* 95 102* 106* 89 113* 81  BUN _1 CREATININE <0.30* <0.30* <0.30* <0.30* <0.30* <0.30* <0.30*  CALCIUM 7.9* 7.8* 7.6* 7.8* 7.9* 7.6* 7.9*  MG 1.2* 1.6*  --   --   --   --   --   PHOS 2.6 3.0  --   --   --   --   --    GFR: CrCl cannot be calculated (This lab value cannot be used to calculate CrCl because it is not a number: <0.30). Liver Function Tests: Recent Labs  Lab 04/29/19 0500  AST 15  ALT 11  ALKPHOS 75  BILITOT <0.1*  PROT 4.5*  ALBUMIN 1.4*   No results for input(s): LIPASE, AMYLASE in the last 168 hours. No results for input(s): AMMONIA in the last 168 hours. Coagulation Profile: No results for input(s): INR, PROTIME in the last 168 hours. Cardiac Enzymes: No results for input(s): CKTOTAL, CKMB, CKMBINDEX, TROPONINI in the last 168 hours. BNP (last 3 results) No results for input(s): PROBNP in the last 8760 hours. HbA1C: No results for input(s): HGBA1C in the last 72 hours. CBG: No results for input(s): GLUCAP in the last 168 hours. Lipid Profile: No results for input(s): CHOL, HDL, LDLCALC, TRIG, CHOLHDL, LDLDIRECT in the last 72 hours. Thyroid Function Tests: No results for input(s): TSH, T4TOTAL, FREET4, T3FREE, THYROIDAB in the last 72 hours. Anemia Panel: No results for input(s): VITAMINB12, FOLATE, FERRITIN, TIBC, IRON, RETICCTPCT in the last 72 hours. Sepsis Labs: No results for input(s): PROCALCITON, LATICACIDVEN in the last 168 hours.  Recent  Results (from the past 240 hour(s))  Culture, Urine     Status: Abnormal   Collection Time: 04/26/19  7:35 AM   Specimen: Urine, Catheterized  Result Value Ref Range Status   Specimen Description   Final    URINE, CATHETERIZED Performed at [facility] [address], [place],  [address]    Special Requests   Final    NONE Performed at [facility], [address]  [place], [place] [address]    Culture (A)  Final    <10,000 COLONIES/mL INSIGNIFICANT GROWTH Performed at [facility] [address], [place], [place] [address]    Report Status 04/27/2019 FINAL  Final  SARS CORONAVIRUS 2 (TAT 6-24 HRS) Nasopharyngeal Nasopharyngeal Swab     Status: Abnormal   Collection Time: 04/30/19  5:49 PM   Specimen: Nasopharyngeal Swab  Result Value Ref Range Status   SARS Coronavirus 2 POSITIVE (A) NEGATIVE Final    Comment: RESULT CALLED TO, READ BACK BY AND VERIFIED WITH: J HOUSE,RN 0119 05/01/2019 D BRADLEY (NOTE) SARS-CoV-2 target nucleic acids are DETECTED. The SARS-CoV-2 RNA is generally detectable in upper and lower respiratory specimens during the acute phase of infection. Positive results are indicative of the presence of SARS-CoV-2 RNA. Clinical correlation with patient history and other diagnostic information is  necessary to determine patient infection status. Positive results do not rule out bacterial infection or co-infection with other viruses.  The expected result is Negative. Fact Sheet for Patients: SugarRoll.be Fact Sheet for Healthcare Providers: https://www.woods-mathews.com/ This test is not yet approved or cleared by the [place] FDA and  has been authorized for detection and/or diagnosis of SARS-CoV-2 by FDA under an Emergency Use Authorization (EUA). This EUA will remain  in effect (meaning this test can be used) for th e duration of the COVID-19 declaration under  Section 564(b)(1) of the Act, 21 U.S.C. section 360bbb-3(b)(1), unless the authorization is terminated or revoked sooner. Performed at [facility], [place] [address] [place], [place] [address]   Respiratory Panel by RT PCR (Flu A&B, Covid) - Nasopharyngeal Swab     Status: None   Collection Time: 05/01/19  9:37 AM   Specimen: Nasopharyngeal Swab  Result Value Ref Range Status   SARS Coronavirus 2 by RT PCR NEGATIVE NEGATIVE Final    Comment: (NOTE) SARS-CoV-2 target nucleic acids are NOT DETECTED. The SARS-CoV-2 RNA is generally detectable in upper respiratoy specimens during the acute phase of infection. The lowest concentration of SARS-CoV-2 viral copies this assay can detect is 131 copies/mL. A negative result does not preclude SARS-Cov-2 infection and should not be used as the sole basis for treatment or other patient management decisions. A negative result may occur with  improper specimen collection/handling, submission of specimen other than nasopharyngeal swab, presence of viral mutation(s) within the areas targeted by this assay, and inadequate number of viral copies (<131 copies/mL). A negative result must be combined with clinical observations, patient history, and epidemiological information. The expected result is Negative. Fact Sheet for Patients:  PinkCheek.be Fact Sheet for Healthcare Providers:  GravelBags.it This test is not yet ap proved or cleared by the [place] FDA and  has been authorized for detection and/or diagnosis of SARS-CoV-2 by FDA under an Emergency Use Authorization (EUA). This EUA will remain  in effect (meaning this test can be used) for the duration of the COVID-19 declaration under Section 564(b)(1) of the Act, 21 U.S.C. section 360bbb-3(b)(1), unless the authorization is terminated or revoked sooner. The SARS-CoV-2 RNA is generally detectable in upper respiratoy specimens  during the acute phase of infection. The lowest concentration of SARS-CoV-2 viral copies this assay can detect is 131 copies/mL. A negative result does not preclude SARS-Cov-2 infection and should not be used as the sole basis for treatment or other patient management decisions. A negative result may occur with  improper specimen collection/handling, submission of specimen other than nasopharyngeal swab, presence of viral mutation(s) within  the areas targeted by this assay, and inadequate number of viral c opies (<131 copies/mL). A negative result must be combined with clinical observations, patient history, and epidemiological information. The expected result is Negative. Fact Sheet for Patients:  PinkCheek.be Fact Sheet for Healthcare Providers:  GravelBags.it This test is not yet approved or cleared by the [place] FDA and  has been authorized for detection and/or diagnosis of SARS-CoV-2 by FDA under an Emergency Use Authorization (EUA). This EUA will remain  in effect (meaning this test can be used) for the duration of the COVID-19 declaration under Section 564(b)(1) of the Act, 21 U.S.C. section 360bbb-3(b)(1), unless the authorization is terminated or revoked sooner.    Influenza A by PCR NEGATIVE NEGATIVE Final   Influenza B by PCR NEGATIVE NEGATIVE Final    Comment: (NOTE) The Xpert Xpress SARS-CoV-2/FLU/RSV assay is intended as an aid in  the diagnosis of influenza from Nasopharyngeal swab specimens and  should not be used as a sole basis for treatment. Nasal washings and  aspirates are unacceptable for Xpert Xpress SARS-CoV-2/FLU/RSV  testing. Fact Sheet for Patients: PinkCheek.be Fact Sheet for Healthcare Providers: GravelBags.it This test is not yet approved or cleared by the [place] FDA and  has been authorized for detection and/or diagnosis of  SARS-CoV-2 by  FDA under an Emergency Use Authorization (EUA). This EUA will remain  in effect (meaning this test can be used) for the duration of the  Covid-19 declaration under Section 564(b)(1) of the Act, 21  U.S.C. section 360bbb-3(b)(1), unless the authorization is  terminated or revoked. Performed at [facility], [place] [address] [place], [place] [address]      Radiology Studies: CT CHEST WO CONTRAST  Result Date: 05/02/2019 CLINICAL DATA:  Quadriplegia, prior C5 fracture, pneumonia, pleural effusion, tachycardia, leukocytosis, sepsis EXAM: CT CHEST WITHOUT CONTRAST TECHNIQUE: Multidetector CT imaging of the chest was performed following the standard protocol without IV contrast. Sagittal and coronal MPR images reconstructed from axial data set. COMPARISON:  None FINDINGS: Cardiovascular: Minimal enlargement of cardiac silhouette and minimal pericardial effusion. Ascending thoracic aorta upper normal caliber 3.7 cm diameter. Minimal atherosclerotic calcification aorta. Mediastinum/Nodes: Questionable wall thickening of the mid to distal esophagus cannot exclude esophagitis or reflux. LEFT thyroid nodule 2.0 x 1.6 cm. Base of cervical region otherwise normal appearance. No thoracic adenopathy. Few normal size mediastinal lymph nodes seen. Lungs/Pleura: Small LEFT pleural effusion. Partially loculated small RIGHT pleural effusion. Compressive atelectasis of the lower lobes cannot exclude underlying consolidation. Minimal compressive atelectasis of the posterior RIGHT upper lobe. Remaining lungs clear. No pneumothorax or definite pulmonary mass. Upper Abdomen: Visualized upper abdomen unremarkable Musculoskeletal: Diffuse osseous demineralization. Sternum and spine unremarkable. Expansile destructive bone lesion again identified at the posterior RIGHT fifth rib at 5.9 x 4.2 x. 4.6 cm not significantly changed. Old LEFT rib fractures. Question old RIGHT rib fractures as well.  Degenerative changes of the spine with postsurgical changes at C5. IMPRESSION: Small BILATERAL pleural effusions, partially loculated on LEFT. Associated compressive atelectasis of the posterior lungs greatest in lower lobes, cannot exclude underlying consolidation especially in RIGHT lower lobe. Stable expansile bone lesion of the posterior RIGHT fifth rib. LEFT thyroid nodule, previously assessed by thyroid ultrasound and biopsy in 2019, recommend correlation with prior imaging and cytology results. Minimal pericardial effusion. Aortic Atherosclerosis (ICD10-I70.0). Electronically Signed   By: Lavonia Dana M.D.   On: 05/02/2019 11:40     Scheduled Meds: . acidophilus  1 capsule Oral Daily  .  baclofen  20 mg Oral BID  . Chlorhexidine Gluconate Cloth  6 each Topical Daily  . enoxaparin (LOVENOX) injection  40 mg Subcutaneous Daily  . ferrous sulfate  325 mg Oral Q breakfast  . fesoterodine  8 mg Oral q morning - 10a  . lactose free nutrition  237 mL Oral TID WC  . magnesium oxide  400 mg Oral Q breakfast  . metoCLOPramide  10 mg Oral TID WC  . midodrine  10 mg Oral TID WC  . multivitamin with minerals  1 tablet Oral Daily  . pantoprazole  40 mg Oral BID  . potassium chloride  40 mEq Oral Daily  . sodium chloride flush  10-40 mL Intracatheter Q12H  . sodium chloride flush  10-40 mL Intracatheter Q12H  . sucralfate  1 g Oral TID WC & HS  . vitamin C  500 mg Oral Daily  . zinc sulfate  220 mg Oral Daily   Continuous Infusions: . ceftAZIDime (FORTAZ) IVPB 2 gram/100 mL NS (Mini-Bag Plus) Stopped (05/04/19 0559)  . lactated ringers 75 mL/hr at 05/04/19 0500     LOS: 13 days    Time spent:   Bonnell Public, MD [facility]   If 7PM-7AM, please contact night-coverage

## 2019-05-05 DIAGNOSIS — E876 Hypokalemia: Secondary | ICD-10-CM

## 2019-05-05 LAB — MAGNESIUM: Magnesium: 1.5 mg/dL — ABNORMAL LOW (ref 1.7–2.4)

## 2019-05-05 NOTE — Progress Notes (Signed)
PROGRESS NOTE  [name] [name]  DOB: [birth year]  PCP: [name] Frames, PA-C FMB:[id number]  DOA: 04/21/2019  LOS: 14 days   Chief Complaint  Patient presents with  . Possible UTI   Brief narrative: [name] T Dixonis a 53 y.o.malewith medical history significant ofquadriplegia due to a previous C5 fracture, history of decubitus ulcers, recurrent UTIs, colostomy/suprapubic catheter, chronic hypotension. Patient presented to the ED on 12/6 with complaint of nausea and vomiting. Patient feels similarly as he did with his prior UTI. No fever, had some chills. In the ED, patient had a temp of 99.2, he was tachycardic to 120s, hypotensive to 70s, breathing comfortably on room air.  Work-up showed WBC count of 15, potassium 2.9, normal renal function. Patient was admitted to hospitalist medicine service for sepsis secondary to UTI.  Subjective: Patient was seen and examined this morning.  Pleasant middle-aged African-American male.  Bedbound, quadriplegic at baseline.  Not in distress with no new symptoms.  Assessment/Plan: 12/16, COVID-19 test-final result deemed as negative, noninfectious.  Contact precautions will be discontinued as per infection control guidelines  Sepsis due to UTI caused by presence of suprapubic catheter. -Met sepsis criteria on admission with elevated white count, hypotension, significant tachycardia -Patient has history of UTI with with multiple drug-resistant bacteria's including VRE, ESBL E. coli as well as Carbapenem resistant Pseudomonas -Antibiotics management per ID, switched from [place] to ceftazidime . -ID recommended to remove Port-A-Cathgiven he did havebacteremia on previous admissions less than 3 weeks. .-With IV fluids, antibiotics, sepsis parameters improving. No fever, WBC count improving. -Blood cultures done 12/7, with no growth to date . -PICC line inserted 12/11. Left arm swollen, because of which PICC line was removed next day.  PICC  line inserted. -ID recommends IV ceftazidime 10 days after the port was removed which will be till 12/21  -picc line reinserted on rt side  Nausea/vomiting -intermittent continue with as needed meds.  Chronic hypotension -Worsened with sepsis, he is chronically on midodrine, he is asymptomatic with these episodes . -With low cortisol level at 6.5,initially, cosyntropin test has been performed with good response,indication to start on Solu-Cortef. -DIsContinue IV fluid 05/04/2019: Continue midodrine.  Patient may run soft systolic blood pressure chronically.  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 - continue with wound care.-  -Plastic surgery consult appreciated,   wounds seem to be healing well as per plastic surgery on 12 /17/ 2020.  No further debridement indicated.    Hypokalemia -replete as needed 05/04/2019: Resolved.  Hypomagnesemia: Replete as needed 05/04/2019: No recent magnesium level.  Will repeat magnesium level in the morning.  Anemia of chronic disease -Continue iron supplementation, transfuse for hemoglobin less than7.0 -no active bleeding   Quadriplegia -Continue home medications  Chronic left fifth rib lytic lesion -no acute change.  Outpatient follow-up with PCP.  Patient mentions that he is aware and has been told that it is osteomyelitis, nothing to do.  Basilar atelectasis -incentive spirometry  Chronic thyroid nodule-previously biopsied in 2019.  Outpatient follow-up with PCP.  Patient is aware and mentions that it was benign.  DVT prophylaxis: Lovenox Code Status: Full  Family Communication: D/W patient Disposition Plan:  Consultants:   Infectious disease,  plastic surgery Procedures:    Antimicrobials: Anti-infectives (From admission, onward)   Start     Dose/Rate Route Frequency Ordered Stop   04/24/19 0200  cefTAZidime (FORTAZ) 1  g in sodium chloride 0.9 % 100  mL IVPB  Status:  Discontinued     1 g 200 mL/hr over 30 Minutes Intravenous Every 8 hours 04/23/19 1930 04/23/19 1932   04/24/19 0200  cefTAZidime (FORTAZ) 2 g in sodium chloride 0.9 % 100 mL IVPB  Status:  Discontinued     2 g 200 mL/hr over 30 Minutes Intravenous Every 8 hours 04/23/19 1932 04/23/19 1934   04/24/19 0200  ceftAZIDime (FORTAZ) 2 gram/50 mL D5W IVPB - DUPLEX  Status:  Discontinued     2 g 100 mL/hr over 30 Minutes Intravenous Every 8 hours 04/23/19 1935 04/23/19 1948   04/24/19 0200  cefTAZidime (FORTAZ) 2 g in sodium chloride 0.9 % 100 mL IVPB     2 g 200 mL/hr over 30 Minutes Intravenous Every 8 hours 04/23/19 1948 05/06/19 2359   04/21/19 1000  ceftazidime-avibactam (AVYCAZ) 2.5 g in dextrose 5 % 50 mL IVPB  Status:  Discontinued     2.5 g 25 mL/hr over 2 Hours Intravenous Every 8 hours 04/21/19 0828 04/23/19 1925   04/21/19 1000  DAPTOmycin (CUBICIN) 500 mg in sodium chloride 0.9 % IVPB  Status:  Discontinued     500 mg 220 mL/hr over 30 Minutes Intravenous Daily 04/21/19 0902 04/25/19 0752   04/21/19 0830  linezolid (ZYVOX) IVPB 600 mg  Status:  Discontinued     600 mg 300 mL/hr over 60 Minutes Intravenous  Once 04/21/19 0826 04/21/19 0829   04/21/19 0630  piperacillin-tazobactam (ZOSYN) IVPB 3.375 g     3.375 g 100 mL/hr over 30 Minutes Intravenous  Once 04/21/19 5809 04/21/19 9833        Code Status: Full Code   Diet Order            Diet regular Room service appropriate? Yes; Fluid consistency: Thin  Diet effective now              Infusions:  . ceftAZIDime (FORTAZ) IVPB 2 gram/100 mL NS (Mini-Bag Plus) Stopped (05/05/19 0603)  . lactated ringers 75 mL/hr at 05/05/19 1222    Scheduled Meds: . acidophilus  1 capsule Oral Daily  . baclofen  20 mg Oral BID  . Chlorhexidine Gluconate Cloth  6 each Topical Daily  . enoxaparin (LOVENOX) injection  40 mg Subcutaneous Daily  . ferrous sulfate  325 mg Oral Q breakfast  .  fesoterodine  8 mg Oral q morning - 10a  . lactose free nutrition  237 mL Oral TID WC  . magnesium oxide  400 mg Oral Q breakfast  . metoCLOPramide  10 mg Oral TID WC  . midodrine  10 mg Oral TID WC  . multivitamin with minerals  1 tablet Oral Daily  . pantoprazole  40 mg Oral BID  . sodium chloride flush  10-40 mL Intracatheter Q12H  . sodium chloride flush  10-40 mL Intracatheter Q12H  . sucralfate  1 g Oral TID WC & HS  . vitamin C  500 mg Oral Daily  . zinc sulfate  220 mg Oral Daily    PRN meds: acetaminophen, guaiFENesin, lidocaine, polyvinyl alcohol, promethazine, senna-docusate, simethicone, sodium chloride flush, sodium chloride flush, sodium chloride flush   Objective: Vitals:   05/05/19 1200 05/05/19 1201  BP: (!) 89/67   Pulse: (!) 109 (!) 112  Resp: (!) 21 19  Temp:    SpO2: 96% 98%    Intake/Output Summary (Last 24 hours) at 05/05/2019 1357 Last data filed at 05/05/2019 1222 Gross per 24 hour  Intake 2655.84 ml  Output  4050 ml  Net -1394.16 ml   Filed Weights   04/21/19 0444 04/21/19 1130 04/30/19 0547  Weight: 77.7 kg 68.9 kg 83 kg   Weight change:  Body mass index is 24.82 kg/m.   Physical Exam: General exam: Appears calm and comfortable.  Skin: No rashes, lesions or ulcers. HEENT: Atraumatic, normocephalic, supple neck, no obvious bleeding Lungs: Clear to auscultation bilaterally CVS: Regular rate and rhythm GI/Abd soft, nontender, nondistended, bowel sound present CNS: Quadriplegic at baseline Psychiatry: Mood appropriate Extremities: No pedal edema, no calf tenderness,  Data Review: I have personally reviewed the laboratory data and studies available.  Recent Labs  Lab 04/30/19 0320 05/01/19 0335 05/02/19 0317 05/03/19 0224 05/04/19 0456  WBC 11.6* 10.7* 10.8* 8.9 9.7  HGB 8.1* 8.0* 8.0* 7.2* 7.8*  HCT 25.4* 25.9* 25.9* 24.0* 25.8*  MCV 87.0 88.1 88.7 89.9 89.3  PLT 415* 403* 414* 375 422*   BNP    Component Value Date/Time    BNP 49.0 03/23/2019 0540    ProBNP    Component Value Date/Time   PROBNP 79.5 07/19/2013 1938    Terrilee Croak, MD  [facility] 05/05/2019

## 2019-05-06 DIAGNOSIS — L89154 Pressure ulcer of sacral region, stage 4: Secondary | ICD-10-CM

## 2019-05-06 MED ORDER — PRO-STAT SUGAR FREE PO LIQD: 30.0000 mL | Freq: Two times a day (BID) | ORAL | 0 refills | Status: DC

## 2019-05-06 MED ORDER — PRO-STAT SUGAR FREE PO LIQD
30.0000 mL | Freq: Two times a day (BID) | ORAL | Status: DC
  Filled 2019-05-06: qty 30

## 2019-05-06 MED ORDER — MAGNESIUM SULFATE 2 GM/50ML IV SOLN
2.0000 g | Freq: Once | INTRAVENOUS | Status: AC
  Administered 2019-05-06: 10:00:00 2 g via INTRAVENOUS
  Filled 2019-05-06: qty 50

## 2019-05-06 NOTE — Progress Notes (Signed)
Nutrition Follow-up  RD working remotely.   DOCUMENTATION CODES:   Not applicable  INTERVENTION:  - continue Boost Plus TID. - will trial 30 mL Prostat BID, each supplement provides 100 kcal and 15 grams of protein. - continue to encourage PO intakes.    NUTRITION DIAGNOSIS:   Increased nutrient needs related to acute illness, wound healing(sepsis) as evidenced by estimated needs. -ongoing  GOAL:   Patient will meet greater than or equal to 90% of their needs -minimally met  MONITOR:   PO intake, Supplement acceptance, Labs, Weight trends  ASSESSMENT:   53 y.o. male with medical history of quadriplegia d/t previous C5 fracture, decubitus ulcers, recurrent UTIs, colostomy/suprapubic catheter, and chronic hypotension. He presented to the ED on 12/6 with complaint of N/V and felt similarly as he did with his prior UTI. No fever, had some chills. He was admitted for sepsis 2/2 UTI.  He has been accepting Boost Plus ~75% of the time offered. Per flow sheet documentation, he consumed 100% of breakfast and lunch on 12/18; 100% of breakfast on 12/19; 100% of all meals on 12/20. Documentation indicates moderate edema to BLE. He was last weighed on 12/15.  Per notes: - sepsis d/t CAUTI - ID recommended removal of port-a-cath--PICC then placed 12/11 and subsequently removed d/t L arm swelling; replaced in R arm  - PRN meds for N/V - chronic hypotension - chronic osteomyelitis  - anemia of chronic disease - chronic L 5th rib lytic lesion - basilar atelectasis - chronic thyroid nodule   Labs reviewed; creatinine: <0.3 mg/dl, Ca: 7.9 mg/dl, Mg: 1.5 mg/dl. Medications reviewed; 1 capsule risaquad/day, 325 mg ferrous sulfate/day, 400 mg mag-ox/day, 2 g IV Mg sulfate x1 run 12/21, 10 mg reglan TID, daily multivitamin with minerals, 1 g carafate TID, 500 mg ascorbic acid/day, 220 mg zinc sulfate/day.  IVF; LR @ 75 ml/hr.     Diet Order:   Diet Order            Diet regular Room  service appropriate? Yes; Fluid consistency: Thin  Diet effective now              EDUCATION NEEDS:   No education needs have been identified at this time  Skin:  Skin Assessment: Skin Integrity Issues: Skin Integrity Issues:: Stage III, Stage IV, Stage II Stage II: L toe Stage III: bilateral hips, R flank, R shoulder Stage IV: R thigh, R buttocks  Last BM:  12/19  Height:   Ht Readings from Last 1 Encounters:  04/21/19 6' (1.829 m)    Weight:   Wt Readings from Last 1 Encounters:  04/30/19 83 kg    Ideal Body Weight:  72.8 kg  BMI:  Body mass index is 24.82 kg/m.  Estimated Nutritional Needs:   Kcal:  1483-0735 kcal  Protein:  120-135 grams  Fluid:  >/= 2.2 L/day     [name], MS, RD, LDN, [facility] Inpatient Clinical Dietitian Pager # [phone] After hours/weekend pager # [phone]

## 2019-05-06 NOTE — Discharge Summary (Signed)
Physician Discharge Summary  [name] [name] YDX:[id number] DOB: 10-May-1966 DOA: 04/21/2019  PCP: Andria Frames, PA-C  Admit date: 04/21/2019 Discharge date: 05/06/2019  Admitted From: Home Discharge disposition: Home with home health   Code Status: Full Code  Diet Recommendation: Regular diet   Recommendations for Outpatient Follow-Up:   1. Follow-up with PCP as an outpatient  Discharge Diagnosis:   Active Problems:   PVD   Obesity   Quadriplegia ([place])   Sacral decubitus ulcer, stage IV ([name]   S/P colostomy ([name]   Presence of suprapubic catheter ([name]   OSA on CPAP   Normocytic anemia   Severe protein-calorie malnutrition ([name]   Pressure ulcer of right upper back   Anemia of chronic disease   Hypokalemia   Personal history of other (healed) physical injury and trauma   Multiple wounds   Sacral decubitus ulcer   Quadriplegia, C5-C7, complete ([place])   Sepsis ([place])    History of Present Illness / Brief narrative:  [name] T Dixonis a 53 y.o.malewith PMH significant ofquadriplegia due to a previous C5 fracture, history of decubitus ulcers, recurrent UTIs, colostomy/suprapubic catheter, chronic hypotension. Patient presented to the ED on 12/6 with complaint of nausea and vomiting. Patient feels similar to when he had UTI in the past.  No fever, had some chills. In the ED, patient had a temp of 99.2, he was tachycardic to 120s, hypotensive to 70s, breathing comfortably on room air.  Work-up showed WBC count of 15, potassium 2.9, normal renal function. Patient was admitted to hospitalist medicine service for sepsis secondary to UTI.  Hospital Course:   Sepsis due to UTI caused by presence of suprapubic catheter. -Met sepsis criteria on admission with elevated white count, hypotension, significant tachycardia -Patient has history of UTI with with multiple drug-resistant bacteria's including VRE, ESBL E. coli as well as Carbapenem resistant Pseudomonas -ID  recommended to remove Port-A-Cathgiven he did havebacteremia on previous admissions less than 3 weeks. -With IV fluids, antibiotics, sepsis parameters improved.  -Blood cultures done 12/7, with no growth to date . -PICC line inserted on right upper extremity 12/14.  Patient completed 10-day course of IV ceftazidime today on 12/21.   -ID recommends IV ceftazidime 10 days after the port was removed which will be till 12/21   Chronic hypotension -Worsened with sepsis, he is chronically on midodrine -Continue midodrine.   Chronic intermittent nausea/vomiting -intermittent continue with as needed meds.  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 -continue with wound care.-  -Plastic surgery consult appreciated, wounds seem to be healing well as per plastic surgery on 12 /17. No further debridement indicated.   Hypokalemia -corrected with replacement  Hypomagnesemia:Continue daily magnesium supplement.  Enzyme level low again at 1.5 this morning.  IV replacement given as well.    Anemia of chronic disease -hemoglobin stable between 7-8 -Continue iron supplementation -no active bleeding   Quadriplegia -Continue home medications  Chronic left fifth rib lytic lesion -no acute change. Outpatient follow-up with PCP. Patient mentions that he is aware and has been told that it is osteomyelitis, no intervention required  Basilar atelectasis -incentive spirometry  Chronic thyroid nodule-previously biopsied in 2019. Outpatient follow-up with PCP. Patient is aware and mentions that it was benign.  Positive Covid test -On 2/15, Covid PCR was positive.  Repeat test next day was negative.  Per previous documentation, ID deemed the patient noninfectious.  Contact precautions  were discontinued after that per infection control guidelines.  Lab Results  Component  Value Date   SARSCOV2NAA NEGATIVE 05/01/2019   SARSCOV2NAA POSITIVE (A) 04/30/2019   SARSCOV2NAA NEGATIVE 04/21/2019   [place] NEGATIVE 04/01/2019    Recent Labs  Lab 04/30/19 0320 05/01/19 0335 05/02/19 0317 05/03/19 0224 05/04/19 0456  WBC 11.6* 10.7* 10.8* 8.9 9.7   No results for input(s): DDIMER, FERRITIN, LDH, CRP in the last 72 hours.  Stable for discharge to home today.  Patient's sister takes care of him at home.  Home health PT, RN, aide and social worker ordered.   Subjective:  Seen and examined this morning.  Pleasant middle-aged African-American male.  Bedbound.  Quadriplegic.  Has limited movement of upper extremities.  Not in distress.  Discharge Exam:   Vitals:   05/06/19 0600 05/06/19 0800 05/06/19 1016 05/06/19 1200  BP: 140/80  107/75   Pulse: (!) 111  (!) 110   Resp: 14  19   Temp:  98.9 F (37.2 C)  99.5 F (37.5 C)  TempSrc:  Oral  Oral  SpO2: 94%  98%   Weight:      Height:        Body mass index is 24.82 kg/m.  General exam: Appears calm and comfortable.  Skin: No rashes, lesions or ulcers. HEENT: Atraumatic, normocephalic, supple neck, no obvious bleeding Lungs: Clear to auscultation bilaterally CVS: Regular rate and rhythm GI/Abd soft, nontender, nondistended, bowel sound present CNS: Quadriplegic at baseline.  Limited mobility of upper extremities. Psychiatry: Mood appropriate Extremities: No pedal edema, no calf tenderness, Back: Chronic decubitus ulcers at baseline.  Discharge Instructions:  Wound care: Wound care RN for wound care Discharge Instructions    Diet general   Complete by: As directed    Increase activity slowly   Complete by: As directed      Follow-up Information    Park Meo T, PA-C Follow up.   Specialty: Physician Assistant Contact information: [place] [address] [phone]          Allergies as of 05/06/2019      Reactions   Ferumoxytol Anaphylaxis, Other  (See Comments)   (Feraheme) = "SYNCOPE; patient tolerated Venofer 10/01/18 s rxn"   Oxybutynin Other (See Comments)   Hallucinations    Vancomycin Other (See Comments)   ARF 05-2016 -- affects kidneys      Medication List    TAKE these medications   acetaminophen 325 MG tablet Commonly known as: TYLENOL Take 2 tablets (650 mg total) by mouth every 6 (six) hours as needed for mild pain, fever or headache.   albuterol 108 (90 Base) MCG/ACT inhaler Commonly known as: VENTOLIN HFA Inhale 2 puffs into the lungs every 6 (six) hours as needed for wheezing or shortness of breath.   baclofen 20 MG tablet Commonly known as: LIORESAL Take 20 mg by mouth 2 (two) times daily.   Chlorhexidine Gluconate Cloth 2 % Pads Apply 6 each topically daily. Use as directed   feeding supplement (PRO-STAT SUGAR FREE 64) Liqd Take 30 mLs by mouth 2 (two) times daily.   ferrous sulfate 325 (65 FE) MG tablet TAKE 1 TABLET (325 MG TOTAL) BY MOUTH 3 (THREE) TIMES DAILY WITH MEALS. What changed: See the new instructions.   furosemide 20 MG tablet Commonly known as: LASIX Take 1 tablet (20 mg total) by mouth 2 (two) times daily.   guaiFENesin 100 MG/5ML Soln Commonly known as: ROBITUSSIN Take 5 mLs (100 mg  total) by mouth every 4 (four) hours as needed for cough or to loosen phlegm.   ipratropium 17 MCG/ACT inhaler Commonly known as: ATROVENT HFA Inhale 1 puff into the lungs 3 (three) times daily.   levalbuterol 45 MCG/ACT inhaler Commonly known as: XOPENEX HFA Inhale 1 puff into the lungs every 4 (four) hours as needed for wheezing.   magnesium oxide 400 MG tablet Commonly known as: MAG-OX Take 400 mg by mouth daily with breakfast.   metoCLOPramide 10 MG tablet Commonly known as: REGLAN Take 1 tablet (10 mg total) by mouth 3 (three) times daily with meals.   midodrine 10 MG tablet Commonly known as: PROAMATINE Take 1 tablet (10 mg total) by mouth 3 (three) times daily with meals.   mouth  rinse Liqd solution 15 mLs by Mouth Rinse route 2 (two) times daily.   multivitamin with minerals Tabs tablet Take 1 tablet by mouth every morning.   nutrition supplement (JUVEN) Pack Take 1 packet by mouth 2 (two) times daily between meals.   Ensure Max Protein Liqd Take 330 mLs (11 oz total) by mouth daily.   ondansetron 8 MG disintegrating tablet Commonly known as: Zofran ODT Take 1 tablet (8 mg total) by mouth every 8 (eight) hours as needed for nausea or vomiting. What changed: reasons to take this   pantoprazole 40 MG tablet Commonly known as: PROTONIX Take 40 mg by mouth 2 (two) times daily.   polyethylene glycol 17 g packet Commonly known as: MIRALAX / GLYCOLAX Take 17 g by mouth daily as needed for moderate constipation or severe constipation.   potassium chloride SA 20 MEQ tablet Commonly known as: KLOR-CON Take 2 tablets (40 mEq total) by mouth daily.   PROBIOTIC PO Take 1 capsule by mouth daily.   Santyl ointment Generic drug: collagenase Apply 1 application topically See admin instructions. Apply daily as directed to affected area(s) of the right hip   senna-docusate 8.6-50 MG tablet Commonly known as: Senokot-S Take 2 tablets by mouth at bedtime as needed for mild constipation or moderate constipation.   simethicone 80 MG chewable tablet Commonly known as: Gas-X Chew 1 tablet (80 mg total) by mouth 4 (four) times daily as needed for flatulence.   sucralfate 1 g tablet Commonly known as: CARAFATE Take 1 tablet (1 g total) by mouth 4 (four) times daily.   sulfamethoxazole-trimethoprim 800-160 MG tablet Commonly known as: BACTRIM DS Take 2 tablets by mouth every 12 (twelve) hours. Take 2 tabs BID until 04/05/19   Toviaz 8 MG Tb24 tablet Generic drug: fesoterodine Take 8 mg by mouth every morning.   vitamin C 500 MG tablet Commonly known as: ASCORBIC ACID Take 500 mg by mouth daily.   Zinc 50 MG Tabs Take 50 mg by mouth 2 (two) times daily.         Time coordinating discharge: 35 minutes  The results of significant diagnostics from this hospitalization (including imaging, microbiology, ancillary and laboratory) are listed below for reference.    Procedures and Diagnostic Studies:   DG Chest Port 1 View  Result Date: 04/21/2019 CLINICAL DATA:  Initial evaluation for acute vomiting. EXAM: PORTABLE CHEST 1 VIEW COMPARISON:  Prior radiograph from 04/01/2019. FINDINGS: Patient is rotated to the left. Cardiomegaly grossly stable. Mediastinal silhouette within normal limits. Right-sided Port-A-Cath in place with tip overlying the cavoatrial junction, unchanged. Lungs are hypoinflated. Irregular pleuroparenchymal scarring at the right upper lobe noted, stable. No focal infiltrates. No pulmonary edema or visible pleural effusion. No pneumothorax.  Osseous structures are unchanged. IMPRESSION: Stable appearance of the chest with no active cardiopulmonary disease identified. Electronically Signed   By: Jeannine Boga M.D.   On: 04/21/2019 04:31     Labs:   Basic Metabolic Panel: Recent Labs  Lab 04/30/19 0320 05/01/19 0335 05/02/19 0317 05/03/19 0224 05/04/19 0456 05/05/19 0435  NA 137 139 139 141 138  --   K 3.7 4.0 3.3* 3.5 3.8  --   CL 101 101 99 103 99  --   CO2 29 31 34* 31 32  --   GLUCOSE 102* 106* 89 113* 81  --   BUN _0 --   CREATININE <0.30* <0.30* <0.30* <0.30* <0.30*  --   CALCIUM 7.6* 7.8* 7.9* 7.6* 7.9*  --   MG  --   --   --   --   --  1.5*   GFR CrCl cannot be calculated (This lab value cannot be used to calculate CrCl because it is not a number: <0.30). Liver Function Tests: No results for input(s): AST, ALT, ALKPHOS, BILITOT, PROT, ALBUMIN in the last 168 hours. No results for input(s): LIPASE, AMYLASE in the last 168 hours. No results for input(s): AMMONIA in the last 168 hours. Coagulation profile No results for input(s): INR, PROTIME in the last 168 hours.  CBC: Recent Labs  Lab  04/30/19 0320 05/01/19 0335 05/02/19 0317 05/03/19 0224 05/04/19 0456  WBC 11.6* 10.7* 10.8* 8.9 9.7  HGB 8.1* 8.0* 8.0* 7.2* 7.8*  HCT 25.4* 25.9* 25.9* 24.0* 25.8*  MCV 87.0 88.1 88.7 89.9 89.3  PLT 415* 403* 414* 375 422*   Cardiac Enzymes: No results for input(s): CKTOTAL, CKMB, CKMBINDEX, TROPONINI in the last 168 hours. BNP: Invalid input(s): POCBNP CBG: No results for input(s): GLUCAP in the last 168 hours. D-Dimer No results for input(s): DDIMER in the last 72 hours. Hgb A1c No results for input(s): HGBA1C in the last 72 hours. Lipid Profile No results for input(s): CHOL, HDL, LDLCALC, TRIG, CHOLHDL, LDLDIRECT in the last 72 hours. Thyroid function studies No results for input(s): TSH, T4TOTAL, T3FREE, THYROIDAB in the last 72 hours.  Invalid input(s): FREET3 Anemia work up No results for input(s): VITAMINB12, FOLATE, FERRITIN, TIBC, IRON, RETICCTPCT in the last 72 hours. Microbiology Recent Results (from the past 240 hour(s))  SARS CORONAVIRUS 2 (TAT 6-24 HRS) Nasopharyngeal Nasopharyngeal Swab     Status: Abnormal   Collection Time: 04/30/19  5:49 PM   Specimen: Nasopharyngeal Swab  Result Value Ref Range Status   SARS Coronavirus 2 POSITIVE (A) NEGATIVE Final    Comment: RESULT CALLED TO, READ BACK BY AND VERIFIED WITH: J HOUSE,RN 0119 05/01/2019 D BRADLEY (NOTE) SARS-CoV-2 target nucleic acids are DETECTED. The SARS-CoV-2 RNA is generally detectable in upper and lower respiratory specimens during the acute phase of infection. Positive results are indicative of the presence of SARS-CoV-2 RNA. Clinical correlation with patient history and other diagnostic information is  necessary to determine patient infection status. Positive results do not rule out bacterial infection or co-infection with other viruses.  The expected result is Negative. Fact Sheet for Patients: SugarRoll.be Fact Sheet for Healthcare  Providers: https://www.woods-mathews.com/ This test is not yet approved or cleared by the [place] FDA and  has been authorized for detection and/or diagnosis of SARS-CoV-2 by FDA under an Emergency Use Authorization (EUA). This EUA will remain  in effect (meaning this test can be used) for th e duration of the COVID-19 declaration under Section  564(b)(1) of the Act, 21 U.S.C. section 360bbb-3(b)(1), unless the authorization is terminated or revoked sooner. Performed at [facility], [place] [address] [place], [place] [address]   Respiratory Panel by RT PCR (Flu A&B, Covid) - Nasopharyngeal Swab     Status: None   Collection Time: 05/01/19  9:37 AM   Specimen: Nasopharyngeal Swab  Result Value Ref Range Status   SARS Coronavirus 2 by RT PCR NEGATIVE NEGATIVE Final    Comment: (NOTE) SARS-CoV-2 target nucleic acids are NOT DETECTED. The SARS-CoV-2 RNA is generally detectable in upper respiratoy specimens during the acute phase of infection. The lowest concentration of SARS-CoV-2 viral copies this assay can detect is 131 copies/mL. A negative result does not preclude SARS-Cov-2 infection and should not be used as the sole basis for treatment or other patient management decisions. A negative result may occur with  improper specimen collection/handling, submission of specimen other than nasopharyngeal swab, presence of viral mutation(s) within the areas targeted by this assay, and inadequate number of viral copies (<131 copies/mL). A negative result must be combined with clinical observations, patient history, and epidemiological information. The expected result is Negative. Fact Sheet for Patients:  PinkCheek.be Fact Sheet for Healthcare Providers:  GravelBags.it This test is not yet ap proved or cleared by the [place] FDA and  has been authorized for detection and/or diagnosis of SARS-CoV-2 by FDA  under an Emergency Use Authorization (EUA). This EUA will remain  in effect (meaning this test can be used) for the duration of the COVID-19 declaration under Section 564(b)(1) of the Act, 21 U.S.C. section 360bbb-3(b)(1), unless the authorization is terminated or revoked sooner. The SARS-CoV-2 RNA is generally detectable in upper respiratoy specimens during the acute phase of infection. The lowest concentration of SARS-CoV-2 viral copies this assay can detect is 131 copies/mL. A negative result does not preclude SARS-Cov-2 infection and should not be used as the sole basis for treatment or other patient management decisions. A negative result may occur with  improper specimen collection/handling, submission of specimen other than nasopharyngeal swab, presence of viral mutation(s) within the areas targeted by this assay, and inadequate number of viral c opies (<131 copies/mL). A negative result must be combined with clinical observations, patient history, and epidemiological information. The expected result is Negative. Fact Sheet for Patients:  PinkCheek.be Fact Sheet for Healthcare Providers:  GravelBags.it This test is not yet approved or cleared by the [place] FDA and  has been authorized for detection and/or diagnosis of SARS-CoV-2 by FDA under an Emergency Use Authorization (EUA). This EUA will remain  in effect (meaning this test can be used) for the duration of the COVID-19 declaration under Section 564(b)(1) of the Act, 21 U.S.C. section 360bbb-3(b)(1), unless the authorization is terminated or revoked sooner.    Influenza A by PCR NEGATIVE NEGATIVE Final   Influenza B by PCR NEGATIVE NEGATIVE Final    Comment: (NOTE) The Xpert Xpress SARS-CoV-2/FLU/RSV assay is intended as an aid in  the diagnosis of influenza from Nasopharyngeal swab specimens and  should not be used as a sole basis for treatment. Nasal  washings and  aspirates are unacceptable for Xpert Xpress SARS-CoV-2/FLU/RSV  testing. Fact Sheet for Patients: PinkCheek.be Fact Sheet for Healthcare Providers: GravelBags.it This test is not yet approved or cleared by the [place] FDA and  has been authorized for detection and/or diagnosis of SARS-CoV-2 by  FDA under an Emergency Use Authorization (EUA). This EUA will remain  in effect (meaning this  test can be used) for the duration of the  Covid-19 declaration under Section 564(b)(1) of the Act, 21  U.S.C. section 360bbb-3(b)(1), unless the authorization is  terminated or revoked. Performed at [facility], [place] [address], [place], [place] [address]     Please note: You were cared for by a hospitalist during your hospital stay. Once you are discharged, your primary care physician will handle any further medical issues. Please note that NO REFILLS for any discharge medications will be authorized once you are discharged, as it is imperative that you return to your primary care physician (or establish a relationship with a primary care physician if you do not have one) for your post hospital discharge needs so that they can reassess your need for medications and monitor your lab values.  Signed: Terrilee Croak  [facility] 05/06/2019, 12:04 PM

## 2019-05-06 NOTE — TOC Transition Note (Addendum)
Transition of Care [facility]) - CM/SW Discharge Note   Patient Details  Name: [name] [name] MRN: [id number] Date of Birth: November 21, 1965  [facility]) CM/SW Contact:  Lynnell Catalan, RN Phone Number: 05/06/2019, 12:08 PM   Clinical Narrative:    Due to pt being done with his long term IV abx he is now able to go back home. Pt was active with [facility] home health prior to admission and will have them at dc. This CM contacted [facility] liaison to alert of DC home. MD orders received. PTAR set up for transport home. Pt usual caregiver (sister) is covid positive and his nephew will be caring for him.     Barriers to Discharge: Continued Medical Work up    Readmission Risk Interventions Readmission Risk Prevention Plan 04/29/2019 04/01/2019 03/27/2019  Transportation Screening Complete Complete Complete  PCP or Specialist Appt within 5-7 Days - - -  PCP or Specialist Appt within 3-5 Days - - -  Not Complete comments - - -  Home Care Screening - - -  Medication Review (RN CM) - - -  [place] or [place] for [place] - - -  Medication Review (RN Care Manager) Complete Referral to Pharmacy Complete  PCP or Specialist appointment within 3-5 days of discharge Complete Not Complete Complete  PCP/Specialist Appt Not Complete comments - DC date unknown -  HRI or Home Care Consult Complete Complete Complete  SW Recovery Care/Counseling Consult Complete - Complete  Palliative Care Screening Not Applicable Not Complete Not Applicable  Comments - no desire -  Skilled Nursing Facility Complete Not Applicable Not Applicable  Some recent data might be hidden

## 2019-05-06 NOTE — Progress Notes (Signed)
Chaplain providing brief support during rounding due to length of stay and rehospitalization.   [name] [name] noted that he is going home today.  Feeling somewhat apprehensive due to his sister / long term care giver isolating due to [place].   Other family members have provided care for [facility] in the past, but not for extended periods of time.  [name] [name] wishes for prayers for his sister.  Chaplain shared prayers at bedside.

## 2019-05-08 DIAGNOSIS — L8944 Pressure ulcer of contiguous site of back, buttock and hip, stage 4: Secondary | ICD-10-CM | POA: Diagnosis not present

## 2019-05-08 DIAGNOSIS — S31809A Unspecified open wound of unspecified buttock, initial encounter: Secondary | ICD-10-CM | POA: Diagnosis not present

## 2019-05-08 DIAGNOSIS — S31102A Unspecified open wound of abdominal wall, epigastric region without penetration into peritoneal cavity, initial encounter: Secondary | ICD-10-CM | POA: Diagnosis not present

## 2019-05-08 DIAGNOSIS — Z933 Colostomy status: Secondary | ICD-10-CM | POA: Diagnosis not present

## 2019-05-10 ENCOUNTER — Emergency Department (HOSPITAL_COMMUNITY): Payer: Medicare Other

## 2019-05-10 ENCOUNTER — Inpatient Hospital Stay (HOSPITAL_COMMUNITY)
Admission: EM | Admit: 2019-05-10 | Discharge: 2019-05-22 | DRG: 871 | Disposition: A | Discharge status: Skilled Nursing Facility | Payer: Medicare Other | Attending: Internal Medicine | Admitting: Internal Medicine

## 2019-05-10 DIAGNOSIS — D638 Anemia in other chronic diseases classified elsewhere: Secondary | ICD-10-CM | POA: Diagnosis not present

## 2019-05-10 DIAGNOSIS — A419 Sepsis, unspecified organism: Secondary | ICD-10-CM

## 2019-05-10 DIAGNOSIS — R1111 Vomiting without nausea: Secondary | ICD-10-CM | POA: Diagnosis not present

## 2019-05-10 DIAGNOSIS — J90 Pleural effusion, not elsewhere classified: Secondary | ICD-10-CM | POA: Diagnosis not present

## 2019-05-10 DIAGNOSIS — L89612 Pressure ulcer of right heel, stage 2: Secondary | ICD-10-CM | POA: Diagnosis present

## 2019-05-10 DIAGNOSIS — I959 Hypotension, unspecified: Secondary | ICD-10-CM | POA: Diagnosis present

## 2019-05-10 DIAGNOSIS — R509 Fever, unspecified: Secondary | ICD-10-CM

## 2019-05-10 DIAGNOSIS — Z515 Encounter for palliative care: Secondary | ICD-10-CM | POA: Diagnosis not present

## 2019-05-10 DIAGNOSIS — L89893 Pressure ulcer of other site, stage 3: Secondary | ICD-10-CM | POA: Diagnosis not present

## 2019-05-10 DIAGNOSIS — A0472 Enterocolitis due to Clostridium difficile, not specified as recurrent: Secondary | ICD-10-CM | POA: Diagnosis not present

## 2019-05-10 DIAGNOSIS — I2510 Atherosclerotic heart disease of native coronary artery without angina pectoris: Secondary | ICD-10-CM | POA: Diagnosis present

## 2019-05-10 DIAGNOSIS — Z881 Allergy status to other antibiotic agents status: Secondary | ICD-10-CM

## 2019-05-10 DIAGNOSIS — Z1624 Resistance to multiple antibiotics: Secondary | ICD-10-CM | POA: Diagnosis not present

## 2019-05-10 DIAGNOSIS — K5909 Other constipation: Secondary | ICD-10-CM | POA: Diagnosis present

## 2019-05-10 DIAGNOSIS — Z981 Arthrodesis status: Secondary | ICD-10-CM

## 2019-05-10 DIAGNOSIS — E43 Unspecified severe protein-calorie malnutrition: Secondary | ICD-10-CM | POA: Diagnosis not present

## 2019-05-10 DIAGNOSIS — L89324 Pressure ulcer of left buttock, stage 4: Secondary | ICD-10-CM | POA: Diagnosis present

## 2019-05-10 DIAGNOSIS — E876 Hypokalemia: Secondary | ICD-10-CM | POA: Diagnosis not present

## 2019-05-10 DIAGNOSIS — R8271 Bacteriuria: Secondary | ICD-10-CM | POA: Diagnosis present

## 2019-05-10 DIAGNOSIS — Z87440 Personal history of urinary (tract) infections: Secondary | ICD-10-CM

## 2019-05-10 DIAGNOSIS — J9610 Chronic respiratory failure, unspecified whether with hypoxia or hypercapnia: Secondary | ICD-10-CM | POA: Diagnosis present

## 2019-05-10 DIAGNOSIS — L89314 Pressure ulcer of right buttock, stage 4: Secondary | ICD-10-CM | POA: Diagnosis present

## 2019-05-10 DIAGNOSIS — Z8719 Personal history of other diseases of the digestive system: Secondary | ICD-10-CM

## 2019-05-10 DIAGNOSIS — Z8701 Personal history of pneumonia (recurrent): Secondary | ICD-10-CM

## 2019-05-10 DIAGNOSIS — K219 Gastro-esophageal reflux disease without esophagitis: Secondary | ICD-10-CM | POA: Diagnosis present

## 2019-05-10 DIAGNOSIS — L8990 Pressure ulcer of unspecified site, unspecified stage: Secondary | ICD-10-CM | POA: Diagnosis present

## 2019-05-10 DIAGNOSIS — R197 Diarrhea, unspecified: Secondary | ICD-10-CM | POA: Diagnosis not present

## 2019-05-10 DIAGNOSIS — L89213 Pressure ulcer of right hip, stage 3: Secondary | ICD-10-CM | POA: Diagnosis present

## 2019-05-10 DIAGNOSIS — D509 Iron deficiency anemia, unspecified: Secondary | ICD-10-CM | POA: Diagnosis present

## 2019-05-10 DIAGNOSIS — L89113 Pressure ulcer of right upper back, stage 3: Secondary | ICD-10-CM | POA: Diagnosis not present

## 2019-05-10 DIAGNOSIS — Z20822 Contact with and (suspected) exposure to covid-19: Secondary | ICD-10-CM | POA: Diagnosis not present

## 2019-05-10 DIAGNOSIS — E662 Morbid (severe) obesity with alveolar hypoventilation: Secondary | ICD-10-CM | POA: Diagnosis present

## 2019-05-10 DIAGNOSIS — M4628 Osteomyelitis of vertebra, sacral and sacrococcygeal region: Secondary | ICD-10-CM | POA: Diagnosis not present

## 2019-05-10 DIAGNOSIS — G8250 Quadriplegia, unspecified: Secondary | ICD-10-CM | POA: Diagnosis present

## 2019-05-10 DIAGNOSIS — I9589 Other hypotension: Secondary | ICD-10-CM | POA: Diagnosis present

## 2019-05-10 DIAGNOSIS — Z933 Colostomy status: Secondary | ICD-10-CM

## 2019-05-10 DIAGNOSIS — L89892 Pressure ulcer of other site, stage 2: Secondary | ICD-10-CM | POA: Diagnosis present

## 2019-05-10 DIAGNOSIS — Z960 Presence of urogenital implants: Secondary | ICD-10-CM | POA: Diagnosis present

## 2019-05-10 DIAGNOSIS — R0689 Other abnormalities of breathing: Secondary | ICD-10-CM | POA: Diagnosis not present

## 2019-05-10 DIAGNOSIS — Z7189 Other specified counseling: Secondary | ICD-10-CM | POA: Diagnosis not present

## 2019-05-10 DIAGNOSIS — N390 Urinary tract infection, site not specified: Secondary | ICD-10-CM | POA: Diagnosis not present

## 2019-05-10 DIAGNOSIS — I10 Essential (primary) hypertension: Secondary | ICD-10-CM | POA: Diagnosis present

## 2019-05-10 DIAGNOSIS — L89894 Pressure ulcer of other site, stage 4: Secondary | ICD-10-CM | POA: Diagnosis not present

## 2019-05-10 DIAGNOSIS — Z8711 Personal history of peptic ulcer disease: Secondary | ICD-10-CM

## 2019-05-10 DIAGNOSIS — A414 Sepsis due to anaerobes: Secondary | ICD-10-CM | POA: Diagnosis not present

## 2019-05-10 DIAGNOSIS — Z888 Allergy status to other drugs, medicaments and biological substances status: Secondary | ICD-10-CM

## 2019-05-10 DIAGNOSIS — L89223 Pressure ulcer of left hip, stage 3: Secondary | ICD-10-CM | POA: Diagnosis present

## 2019-05-10 DIAGNOSIS — R0902 Hypoxemia: Secondary | ICD-10-CM | POA: Diagnosis not present

## 2019-05-10 DIAGNOSIS — Z79899 Other long term (current) drug therapy: Secondary | ICD-10-CM

## 2019-05-10 DIAGNOSIS — B999 Unspecified infectious disease: Secondary | ICD-10-CM | POA: Diagnosis not present

## 2019-05-10 DIAGNOSIS — R918 Other nonspecific abnormal finding of lung field: Secondary | ICD-10-CM | POA: Diagnosis not present

## 2019-05-10 DIAGNOSIS — R109 Unspecified abdominal pain: Secondary | ICD-10-CM | POA: Diagnosis not present

## 2019-05-10 DIAGNOSIS — K3184 Gastroparesis: Secondary | ICD-10-CM | POA: Diagnosis present

## 2019-05-10 DIAGNOSIS — Z8619 Personal history of other infectious and parasitic diseases: Secondary | ICD-10-CM

## 2019-05-10 DIAGNOSIS — Z6824 Body mass index (BMI) 24.0-24.9, adult: Secondary | ICD-10-CM

## 2019-05-10 LAB — PROTIME-INR
INR: 1 (ref 0.8–1.2)
Prothrombin Time: 13.1 seconds (ref 11.4–15.2)

## 2019-05-10 LAB — COMPREHENSIVE METABOLIC PANEL
ALT: 17 U/L (ref 0–44)
AST: 24 U/L (ref 15–41)
Albumin: 2 g/dL — ABNORMAL LOW (ref 3.5–5.0)
Alkaline Phosphatase: 81 U/L (ref 38–126)
Anion gap: 9 (ref 5–15)
BUN: 11 mg/dL (ref 6–20)
CO2: 23 mmol/L (ref 22–32)
Calcium: 8.4 mg/dL — ABNORMAL LOW (ref 8.9–10.3)
Chloride: 107 mmol/L (ref 98–111)
Creatinine, Ser: 0.41 mg/dL — ABNORMAL LOW (ref 0.61–1.24)
GFR calc Af Amer: >60 mL/min (ref >60)
GFR calc non Af Amer: >60 mL/min (ref >60)
Glucose, Bld: 116 mg/dL — ABNORMAL HIGH (ref 70–99)
Potassium: 4.3 mmol/L (ref 3.5–5.1)
Sodium: 139 mmol/L (ref 135–145)
Total Bilirubin: 0.3 mg/dL (ref 0.3–1.2)
Total Protein: 6.1 g/dL — ABNORMAL LOW (ref 6.5–8.1)

## 2019-05-10 LAB — CBC WITH DIFFERENTIAL/PLATELET
Abs Immature Granulocytes: 0.02 10*3/uL (ref 0.00–0.07)
Basophils Absolute: 0 10*3/uL (ref 0.0–0.1)
Basophils Relative: 0 %
Eosinophils Absolute: 0 10*3/uL (ref 0.0–0.5)
Eosinophils Relative: 0 %
HCT: 31.9 % — ABNORMAL LOW (ref 39.0–52.0)
Hemoglobin: 9.6 g/dL — ABNORMAL LOW (ref 13.0–17.0)
Immature Granulocytes: 0 %
Lymphocytes Relative: 13 %
Lymphs Abs: 0.8 10*3/uL (ref 0.7–4.0)
MCH: 26.3 pg (ref 26.0–34.0)
MCHC: 30.1 g/dL (ref 30.0–36.0)
MCV: 87.4 fL (ref 80.0–100.0)
Monocytes Absolute: 0.6 10*3/uL (ref 0.1–1.0)
Monocytes Relative: 11 %
Neutro Abs: 4.4 10*3/uL (ref 1.7–7.7)
Neutrophils Relative %: 76 %
Platelets: 400 10*3/uL (ref 150–400)
RBC: 3.65 MIL/uL — ABNORMAL LOW (ref 4.22–5.81)
RDW: 16.6 % — ABNORMAL HIGH (ref 11.5–15.5)
WBC: 5.8 10*3/uL (ref 4.0–10.5)
nRBC: 0 % (ref 0.0–0.2)

## 2019-05-10 LAB — APTT: aPTT: 22 seconds — ABNORMAL LOW (ref 24–36)

## 2019-05-10 LAB — LACTIC ACID, PLASMA: Lactic Acid, Venous: 1.7 mmol/L (ref 0.5–1.9)

## 2019-05-10 MED ORDER — ONDANSETRON HCL 4 MG/2ML IJ SOLN
4.0000 mg | Freq: Once | INTRAMUSCULAR | Status: DC
  Filled 2019-05-10: qty 2

## 2019-05-10 MED ORDER — SODIUM CHLORIDE 0.9 % IV SOLN
1.0000 g | Freq: Once | INTRAVENOUS | Status: AC
  Administered 2019-05-11: 1 g via INTRAVENOUS
  Filled 2019-05-10: qty 1

## 2019-05-10 MED ORDER — LINEZOLID 600 MG/300ML IV SOLN
600.0000 mg | Freq: Once | INTRAVENOUS | Status: AC
  Administered 2019-05-11: 01:00:00 600 mg via INTRAVENOUS
  Filled 2019-05-10: qty 300

## 2019-05-10 MED ORDER — LINEZOLID 600 MG/300ML IV SOLN: 600.0000 mg | Freq: Two times a day (BID) | INTRAVENOUS | Status: DC

## 2019-05-10 MED ORDER — PROMETHAZINE HCL 25 MG/ML IJ SOLN
25.0000 mg | Freq: Once | INTRAMUSCULAR | Status: AC
Start: 2019-05-10 — End: 2019-05-10
  Administered 2019-05-10: 25 mg via INTRAVENOUS
  Filled 2019-05-10: qty 1

## 2019-05-10 MED ORDER — ACETAMINOPHEN 500 MG PO TABS
1000.0000 mg | ORAL_TABLET | Freq: Once | ORAL | Status: AC
  Administered 2019-05-11: 02:00:00 1000 mg via ORAL
  Filled 2019-05-10 (×2): qty 2

## 2019-05-10 MED ORDER — SODIUM CHLORIDE 0.9 % IV BOLUS (SEPSIS)
1000.0000 mL | Freq: Once | INTRAVENOUS | Status: AC
  Administered 2019-05-11: 01:00:00 1000 mL via INTRAVENOUS

## 2019-05-10 MED ORDER — SODIUM CHLORIDE 0.9 % IV BOLUS (SEPSIS)
1000.0000 mL | Freq: Once | INTRAVENOUS | Status: AC
  Administered 2019-05-11: 1000 mL via INTRAVENOUS

## 2019-05-10 MED ORDER — SODIUM CHLORIDE 0.9 % IV BOLUS (SEPSIS)
500.0000 mL | Freq: Once | INTRAVENOUS | Status: AC
  Administered 2019-05-11: 500 mL via INTRAVENOUS
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
## 2019-05-10 NOTE — ED Triage Notes (Signed)
Pt from home with c/o N/V and hypotension. Pt was seen this week and treated for UTI. Pt has had a port and picc line and both were d/c'd. Pt report loss of appetite, N/V, and diarrhea today. Pt denies pain but reports tightness in his abdomen. Pt is quadriplegic, with indwelling catheter, bandaging over wounds, and ostomy bag.

## 2019-05-11 ENCOUNTER — Other Ambulatory Visit: Payer: Self-pay

## 2019-05-11 ENCOUNTER — Encounter (HOSPITAL_COMMUNITY): Payer: Self-pay | Admitting: Internal Medicine

## 2019-05-11 DIAGNOSIS — L89314 Pressure ulcer of right buttock, stage 4: Secondary | ICD-10-CM | POA: Diagnosis present

## 2019-05-11 DIAGNOSIS — L89612 Pressure ulcer of right heel, stage 2: Secondary | ICD-10-CM | POA: Diagnosis present

## 2019-05-11 DIAGNOSIS — L89894 Pressure ulcer of other site, stage 4: Secondary | ICD-10-CM | POA: Diagnosis present

## 2019-05-11 DIAGNOSIS — N390 Urinary tract infection, site not specified: Secondary | ICD-10-CM

## 2019-05-11 DIAGNOSIS — R197 Diarrhea, unspecified: Secondary | ICD-10-CM | POA: Diagnosis present

## 2019-05-11 DIAGNOSIS — L8990 Pressure ulcer of unspecified site, unspecified stage: Secondary | ICD-10-CM | POA: Diagnosis not present

## 2019-05-11 DIAGNOSIS — K3184 Gastroparesis: Secondary | ICD-10-CM | POA: Diagnosis present

## 2019-05-11 DIAGNOSIS — M4628 Osteomyelitis of vertebra, sacral and sacrococcygeal region: Secondary | ICD-10-CM | POA: Diagnosis present

## 2019-05-11 DIAGNOSIS — J9610 Chronic respiratory failure, unspecified whether with hypoxia or hypercapnia: Secondary | ICD-10-CM | POA: Diagnosis present

## 2019-05-11 DIAGNOSIS — D638 Anemia in other chronic diseases classified elsewhere: Secondary | ICD-10-CM | POA: Diagnosis present

## 2019-05-11 DIAGNOSIS — L89892 Pressure ulcer of other site, stage 2: Secondary | ICD-10-CM | POA: Diagnosis present

## 2019-05-11 DIAGNOSIS — I959 Hypotension, unspecified: Secondary | ICD-10-CM | POA: Diagnosis present

## 2019-05-11 DIAGNOSIS — G8250 Quadriplegia, unspecified: Secondary | ICD-10-CM

## 2019-05-11 DIAGNOSIS — K219 Gastro-esophageal reflux disease without esophagitis: Secondary | ICD-10-CM | POA: Diagnosis present

## 2019-05-11 DIAGNOSIS — E662 Morbid (severe) obesity with alveolar hypoventilation: Secondary | ICD-10-CM | POA: Diagnosis present

## 2019-05-11 DIAGNOSIS — Z20822 Contact with and (suspected) exposure to covid-19: Secondary | ICD-10-CM | POA: Diagnosis present

## 2019-05-11 DIAGNOSIS — Z1624 Resistance to multiple antibiotics: Secondary | ICD-10-CM | POA: Diagnosis present

## 2019-05-11 DIAGNOSIS — Z7189 Other specified counseling: Secondary | ICD-10-CM | POA: Diagnosis not present

## 2019-05-11 DIAGNOSIS — L89223 Pressure ulcer of left hip, stage 3: Secondary | ICD-10-CM | POA: Diagnosis present

## 2019-05-11 DIAGNOSIS — A414 Sepsis due to anaerobes: Secondary | ICD-10-CM | POA: Diagnosis present

## 2019-05-11 DIAGNOSIS — L89893 Pressure ulcer of other site, stage 3: Secondary | ICD-10-CM | POA: Diagnosis present

## 2019-05-11 DIAGNOSIS — I2510 Atherosclerotic heart disease of native coronary artery without angina pectoris: Secondary | ICD-10-CM | POA: Diagnosis present

## 2019-05-11 DIAGNOSIS — I10 Essential (primary) hypertension: Secondary | ICD-10-CM | POA: Diagnosis present

## 2019-05-11 DIAGNOSIS — B999 Unspecified infectious disease: Secondary | ICD-10-CM | POA: Diagnosis not present

## 2019-05-11 DIAGNOSIS — I9589 Other hypotension: Secondary | ICD-10-CM | POA: Diagnosis present

## 2019-05-11 DIAGNOSIS — A419 Sepsis, unspecified organism: Secondary | ICD-10-CM | POA: Diagnosis present

## 2019-05-11 DIAGNOSIS — L89113 Pressure ulcer of right upper back, stage 3: Secondary | ICD-10-CM | POA: Diagnosis present

## 2019-05-11 DIAGNOSIS — L89213 Pressure ulcer of right hip, stage 3: Secondary | ICD-10-CM | POA: Diagnosis present

## 2019-05-11 DIAGNOSIS — E43 Unspecified severe protein-calorie malnutrition: Secondary | ICD-10-CM | POA: Diagnosis present

## 2019-05-11 DIAGNOSIS — L89324 Pressure ulcer of left buttock, stage 4: Secondary | ICD-10-CM | POA: Diagnosis present

## 2019-05-11 DIAGNOSIS — A0472 Enterocolitis due to Clostridium difficile, not specified as recurrent: Secondary | ICD-10-CM | POA: Diagnosis present

## 2019-05-11 DIAGNOSIS — Z515 Encounter for palliative care: Secondary | ICD-10-CM | POA: Diagnosis not present

## 2019-05-11 LAB — CLOSTRIDIUM DIFFICILE BY PCR, REFLEXED: Toxigenic C. Difficile by PCR: POSITIVE — AB

## 2019-05-11 LAB — BASIC METABOLIC PANEL
Anion gap: 9 (ref 5–15)
BUN: 13 mg/dL (ref 6–20)
CO2: 23 mmol/L (ref 22–32)
Calcium: 7.1 mg/dL — ABNORMAL LOW (ref 8.9–10.3)
Chloride: 111 mmol/L (ref 98–111)
Creatinine, Ser: 0.36 mg/dL — ABNORMAL LOW (ref 0.61–1.24)
GFR calc Af Amer: >60 mL/min (ref >60)
GFR calc non Af Amer: >60 mL/min (ref >60)
Glucose, Bld: 108 mg/dL — ABNORMAL HIGH (ref 70–99)
Potassium: 3.8 mmol/L (ref 3.5–5.1)
Sodium: 143 mmol/L (ref 135–145)

## 2019-05-11 LAB — URINALYSIS, ROUTINE W REFLEX MICROSCOPIC
Bilirubin Urine: NEGATIVE
Glucose, UA: NEGATIVE mg/dL
Hgb urine dipstick: NEGATIVE
Ketones, ur: 5 mg/dL — AB
Nitrite: NEGATIVE
Protein, ur: 100 mg/dL — AB
Specific Gravity, Urine: 1.016 (ref 1.005–1.030)
pH: 7 (ref 5.0–8.0)

## 2019-05-11 LAB — CBC
HCT: 25.4 % — ABNORMAL LOW (ref 39.0–52.0)
Hemoglobin: 7.6 g/dL — ABNORMAL LOW (ref 13.0–17.0)
MCH: 26.8 pg (ref 26.0–34.0)
MCHC: 29.9 g/dL — ABNORMAL LOW (ref 30.0–36.0)
MCV: 89.4 fL (ref 80.0–100.0)
Platelets: 345 10*3/uL (ref 150–400)
RBC: 2.84 MIL/uL — ABNORMAL LOW (ref 4.22–5.81)
RDW: 17 % — ABNORMAL HIGH (ref 11.5–15.5)
WBC: 8.2 10*3/uL (ref 4.0–10.5)
nRBC: 0 % (ref 0.0–0.2)

## 2019-05-11 LAB — LACTIC ACID, PLASMA: Lactic Acid, Venous: 1.2 mmol/L (ref 0.5–1.9)

## 2019-05-11 LAB — MRSA PCR SCREENING: MRSA by PCR: POSITIVE — AB

## 2019-05-11 LAB — RESPIRATORY PANEL BY RT PCR (FLU A&B, COVID)
Influenza A by PCR: NEGATIVE
Influenza B by PCR: NEGATIVE
SARS Coronavirus 2 by RT PCR: NEGATIVE

## 2019-05-11 LAB — C DIFFICILE QUICK SCREEN W PCR REFLEX
C Diff antigen: POSITIVE — AB
C Diff toxin: NEGATIVE

## 2019-05-11 LAB — POC SARS CORONAVIRUS 2 AG -  ED: SARS Coronavirus 2 Ag: NEGATIVE

## 2019-05-11 MED ORDER — SODIUM CHLORIDE 0.9 % IV SOLN
2.0000 g | Freq: Three times a day (TID) | INTRAVENOUS | Status: DC
  Administered 2019-05-11 – 2019-05-12 (×4): 2 g via INTRAVENOUS
  Filled 2019-05-11 (×6): qty 2

## 2019-05-11 MED ORDER — PANTOPRAZOLE SODIUM 40 MG PO TBEC
40.0000 mg | DELAYED_RELEASE_TABLET | Freq: Two times a day (BID) | ORAL | Status: DC
  Administered 2019-05-11 – 2019-05-22 (×23): 40 mg via ORAL
  Filled 2019-05-11 (×23): qty 1

## 2019-05-11 MED ORDER — ENOXAPARIN SODIUM 40 MG/0.4ML ~~LOC~~ SOLN
40.0000 mg | SUBCUTANEOUS | Status: DC
  Administered 2019-05-11 – 2019-05-21 (×11): 40 mg via SUBCUTANEOUS
  Filled 2019-05-11 (×11): qty 0.4

## 2019-05-11 MED ORDER — SODIUM CHLORIDE 0.9 % IV SOLN
2.0000 g | Freq: Once | INTRAVENOUS | Status: AC
  Administered 2019-05-11: 2 g via INTRAVENOUS
  Filled 2019-05-11: qty 2

## 2019-05-11 MED ORDER — MAGNESIUM OXIDE 400 (241.3 MG) MG PO TABS
400.0000 mg | ORAL_TABLET | Freq: Every day | ORAL | Status: DC
  Administered 2019-05-11 – 2019-05-15 (×5): 400 mg via ORAL
  Filled 2019-05-11 (×5): qty 1

## 2019-05-11 MED ORDER — ADULT MULTIVITAMIN W/MINERALS CH
1.0000 | ORAL_TABLET | Freq: Every day | ORAL | Status: DC
  Administered 2019-05-11 – 2019-05-22 (×12): 1 via ORAL
  Filled 2019-05-11 (×12): qty 1

## 2019-05-11 MED ORDER — ENSURE MAX PROTEIN PO LIQD
11.0000 [oz_av] | Freq: Every day | ORAL | Status: DC
  Administered 2019-05-21 – 2019-05-22 (×2): 11 [oz_av] via ORAL
  Filled 2019-05-11 (×12): qty 330

## 2019-05-11 MED ORDER — BACLOFEN 10 MG PO TABS
20.0000 mg | ORAL_TABLET | Freq: Two times a day (BID) | ORAL | Status: DC
  Administered 2019-05-11 – 2019-05-22 (×23): 20 mg via ORAL
  Filled 2019-05-11 (×2): qty 2
  Filled 2019-05-11 (×3): qty 1
  Filled 2019-05-11 (×2): qty 2
  Filled 2019-05-11: qty 1
  Filled 2019-05-11: qty 2
  Filled 2019-05-11: qty 1
  Filled 2019-05-11: qty 2
  Filled 2019-05-11 (×4): qty 1
  Filled 2019-05-11 (×3): qty 2
  Filled 2019-05-11 (×3): qty 1
  Filled 2019-05-11: qty 2
  Filled 2019-05-11: qty 1
  Filled 2019-05-11 (×3): qty 2
  Filled 2019-05-11 (×3): qty 1
  Filled 2019-05-11 (×2): qty 2
  Filled 2019-05-11 (×2): qty 1
  Filled 2019-05-11: qty 2

## 2019-05-11 MED ORDER — ONDANSETRON HCL 4 MG PO TABS: 4.0000 mg | ORAL_TABLET | Freq: Four times a day (QID) | ORAL | Status: DC | PRN

## 2019-05-11 MED ORDER — FESOTERODINE FUMARATE ER 8 MG PO TB24
8.0000 mg | ORAL_TABLET | Freq: Every day | ORAL | Status: DC
  Administered 2019-05-11 – 2019-05-22 (×12): 8 mg via ORAL
  Filled 2019-05-11 (×13): qty 1

## 2019-05-11 MED ORDER — ALBUTEROL SULFATE (2.5 MG/3ML) 0.083% IN NEBU: 3.0000 mL | INHALATION_SOLUTION | Freq: Four times a day (QID) | RESPIRATORY_TRACT | Status: DC | PRN

## 2019-05-11 MED ORDER — FERROUS SULFATE 325 (65 FE) MG PO TABS
325.0000 mg | ORAL_TABLET | Freq: Every day | ORAL | Status: DC
  Administered 2019-05-11 – 2019-05-22 (×12): 325 mg via ORAL
  Filled 2019-05-11 (×12): qty 1

## 2019-05-11 MED ORDER — MIDODRINE HCL 5 MG PO TABS
10.0000 mg | ORAL_TABLET | Freq: Three times a day (TID) | ORAL | Status: DC
  Administered 2019-05-11 – 2019-05-22 (×32): 10 mg via ORAL
  Filled 2019-05-11 (×35): qty 2

## 2019-05-11 MED ORDER — ACETAMINOPHEN 325 MG PO TABS
650.0000 mg | ORAL_TABLET | Freq: Four times a day (QID) | ORAL | Status: DC | PRN
  Administered 2019-05-13 – 2019-05-20 (×5): 650 mg via ORAL
  Filled 2019-05-11 (×5): qty 2

## 2019-05-11 MED ORDER — ONDANSETRON HCL 4 MG/2ML IJ SOLN: 4.0000 mg | Freq: Four times a day (QID) | INTRAMUSCULAR | Status: DC | PRN

## 2019-05-11 MED ORDER — SODIUM CHLORIDE 0.9 % IV SOLN: INTRAVENOUS | Status: DC

## 2019-05-11 MED ORDER — PRO-STAT SUGAR FREE PO LIQD
30.0000 mL | Freq: Two times a day (BID) | ORAL | Status: DC
  Administered 2019-05-20: 30 mL via ORAL
  Filled 2019-05-11 (×11): qty 30

## 2019-05-11 MED ORDER — VANCOMYCIN 50 MG/ML ORAL SOLUTION
125.0000 mg | Freq: Four times a day (QID) | ORAL | Status: AC
  Administered 2019-05-11 – 2019-05-21 (×38): 125 mg via ORAL
  Filled 2019-05-11 (×46): qty 2.5

## 2019-05-11 MED ORDER — MUPIROCIN 2 % EX OINT
1.0000 "application " | TOPICAL_OINTMENT | Freq: Two times a day (BID) | CUTANEOUS | Status: AC
  Administered 2019-05-11 – 2019-05-15 (×10): 1 via NASAL
  Filled 2019-05-11: qty 22

## 2019-05-11 MED ORDER — LACTATED RINGERS IV BOLUS (SEPSIS)
2500.0000 mL | Freq: Once | INTRAVENOUS | Status: AC
  Administered 2019-05-11: 2500 mL via INTRAVENOUS

## 2019-05-11 MED ORDER — GUAIFENESIN 100 MG/5ML PO SOLN: 5.0000 mL | ORAL | Status: DC | PRN

## 2019-05-11 MED ORDER — SUCRALFATE 1 G PO TABS
1.0000 g | ORAL_TABLET | Freq: Four times a day (QID) | ORAL | Status: DC
  Administered 2019-05-11 – 2019-05-22 (×45): 1 g via ORAL
  Filled 2019-05-11 (×45): qty 1

## 2019-05-11 MED ORDER — CHLORHEXIDINE GLUCONATE CLOTH 2 % EX PADS
6.0000 | MEDICATED_PAD | Freq: Every day | CUTANEOUS | Status: DC
  Administered 2019-05-11 – 2019-05-22 (×11): 6 via TOPICAL

## 2019-05-11 MED ORDER — METOCLOPRAMIDE HCL 10 MG PO TABS
10.0000 mg | ORAL_TABLET | Freq: Three times a day (TID) | ORAL | Status: DC
  Administered 2019-05-11 – 2019-05-22 (×32): 10 mg via ORAL
  Filled 2019-05-11 (×33): qty 1

## 2019-05-11 MED ORDER — SENNOSIDES-DOCUSATE SODIUM 8.6-50 MG PO TABS: 2.0000 | ORAL_TABLET | Freq: Every evening | ORAL | Status: DC | PRN

## 2019-05-11 MED ORDER — LEVALBUTEROL HCL 0.63 MG/3ML IN NEBU: 0.6300 mg | INHALATION_SOLUTION | RESPIRATORY_TRACT | Status: DC | PRN

## 2019-05-11 MED ORDER — JUVEN PO PACK
1.0000 | PACK | Freq: Two times a day (BID) | ORAL | Status: DC
  Filled 2019-05-11 (×5): qty 1

## 2019-05-11 NOTE — Progress Notes (Addendum)
[facility] PLAN OF CARE NOTE Patient: [name] [name] [id number]   PCP: Park Meo T, PA-C DOB: Nov 25, 1965   DOA: 05/10/2019   DOS: 05/11/2019    Patient was admitted by my colleague Dr. Alcario Drought earlier on 05/11/2019. I have reviewed the H&P as well as assessment and plan and agree with the same. Important changes in the plan are listed below.  Plan of care: Principal Problem:   Sepsis ([place]) Active Problems:   Quadriplegia ([place])   Pressure injury of skin   Acute lower UTI   Sepsis associated hypotension ([name] C. difficile colitis  Primary presents with nausea vomiting and diarrhea ongoing at home for last couple of days. Stool positive for C. difficile. We will start the patient on IV antibiotics.  MDRO osteomyelitis. Patient will require infectious disease consult.  Goals of care discussion. Patient has MDRO, sacral ulcer, quadriplegia and now has C. difficile colitis. Concern is that the patient is a poor candidate for further intervention given his increased risk of recurrent infection. Along with that the patient also has severe protein calorie malnutrition. Palliative care consulted for further assistance of goals of care.  Pressure injury of the skin. Present on admission. Multiple areas. Patient will require plastic surgery consultation. Although suspect patient will continue to have ongoing poor wound healing.  Right lower back    Right upper back    Right lower back    Right lower back    Sacral ulcer    Sacral ulcer    Back    Left thigh    Author: Berle Mull, MD [facility] 05/11/2019 7:16 PM   If 7PM-7AM, please contact night-coverage at [email or web address]

## 2019-05-11 NOTE — ED Notes (Signed)
SDU  Breakfast ordered  
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
## 2019-05-11 NOTE — Progress Notes (Addendum)
Pharmacy Antibiotic Note  [name] [name] is a 53 y.o. male admitted on 05/10/2019 with UTI with signs of sepsis.  Pharmacy has been consulted for cefepime dosing.  Of note pt has recent h/o MDR Pseudomonas in urine, sensitive to cefepime and Zosyn, as well as blood cx in Nov w/ MDR acinetobacter.  Plan: Cefepime 2g IV Q8H; will decrease if sepsis ruled out.  Temp (24hrs), Avg:100.5 F (38.1 C), Min:100.5 F (38.1 C), Max:100.5 F (38.1 C)  Recent Labs  Lab 05/04/19 0456 05/10/19 2230  WBC 9.7 5.8  CREATININE <0.30* 0.41*  LATICACIDVEN  --  1.7    Estimated Creatinine Clearance: 118.6 mL/min (A) (by C-G formula based on SCr of 0.41 mg/dL (L)).    Allergies  Allergen Reactions  . Ferumoxytol Anaphylaxis and Other (See Comments)    (Feraheme) = "SYNCOPE; patient tolerated Venofer 10/01/18 s rxn"   . Oxybutynin Other (See Comments)    Hallucinations    . Vancomycin Other (See Comments)    ARF 05-2016 -- affects kidneys     Thank you for allowing pharmacy to be a part of this patient's care.  Wynona Neat, PharmD, BCPS  05/11/2019 2:43 AM

## 2019-05-11 NOTE — Progress Notes (Signed)
Consult was placed to iv team to put a new iv in the right arm , per patient request; attempted x 2 with ultrasound, using 20ga, 1.88" catheters, as veins are deep and small;  Unable to thread catheters due to scar tissue; pt has had multiple picc lines and a portacath;  IR places all central lines for this pt.  Current site [facility] still infusing; site wnl;

## 2019-05-11 NOTE — Consult Note (Addendum)
[facility] Nurse ostomy consult note Stoma type/location: LLQ colostomy Stomal assessment/size: Not seen today.  20 days ago measured 1 and 5/8 inches, slightly raised. Peristomal assessment: Not seen today Treatment options for stomal/peristomal skin: skin barrier ring Output: per RN, brown stool, soft  Ostomy pouching: 2pc. 2 and 3/4 inch ostomy pouching system with skin barrier ring. Unit Secretary contacted and supplies ordered to bedside. Education provided: None Enrolled patient in [place] program: No. Patient is established with a provider for his osotmy supplies..   WOC Nurse Consult Note: Reason for Consult: Patient is well known to the [facility] nurses from his previous admissions.  Last seen by this writer 19 days ago at [facility]. See note with wound measurements from 04/22/19. Mr. Bolash was seen by Plastic Surgery in the fall of this year and ACell was used for wound healing (03/11/19). During the 12/6 admission, Plastic PA Roetta Sessions saw patient, supported the continuation of NS dressings to all wounds and requested updated photographs.  Wound type:Pressure Pressure Injury POA: Yes Measurement: See detailed note from 04/22/19. Wound bed: red, moist Drainage (amount, consistency, odor) small to moderate amounts of serous to light yellow exudate. Periwound: with evidence of previous wound healing, scarring and contraction Dressing procedure/placement/frequency: I have provided Nursing with guidance for topical wound care via the orders using NS to cleanse and then dressing wounds with saline moistened gauze, topping with dry gauze, using an ABD to the buttocks and shoulder wounds prior to securing with 2-inch paper tape.  A mattress replacement with low air loss feature is added to manage microclimate and to assist in positioning.  Recommend re consulting Plastics this admission as during the last admission they were considering another ACell intervention and  surgical debridement of the areas that were developing small amounts of yellow eschar. Patient has not had an office appointment with the Plastic Surgeon since last discharge.  If you agree, please reconsult.  Updated photographs are requested of the Hospitalist via [place].   [facility] nursing team will not follow, but will remain available to this patient, the nursing and medical teams.  Please re-consult if needed. Thanks, Maudie Flakes, MSN, RN, [place], Arther Abbott  Pager# [phone]

## 2019-05-11 NOTE — Progress Notes (Signed)
Pt arrived to the unit. Pt A & O x 4. Pt is tachycardiac/tachypneic. Pt on RA O2 at 97%. No c/o pain. CHG bath completed. MRSA swab completed. Admission questions and assessment done. Pt has extensive wounds on body put in a stat wound care consult. Pt oriented to unit and set up. Telemetry monitor started and verified. Pt resting comfortably. Will continue to monitor.

## 2019-05-11 NOTE — ED Provider Notes (Addendum)
  Physical Exam  BP 96/62   Pulse (!) 125   Temp (!) 100.5 F (38.1 C)   Resp (!) 29   SpO2 93%   Physical Exam  ED Course/Procedures     .Critical Care Performed by: Varney Biles, MD Authorized by: Varney Biles, MD   Critical care provider statement:    Critical care time (minutes):  35   Critical care was time spent personally by me on the following activities:  Discussions with consultants, evaluation of patient's response to treatment, examination of patient, ordering and performing treatments and interventions, ordering and review of laboratory studies, ordering and review of radiographic studies, pulse oximetry, re-evaluation of patient's condition, obtaining history from patient or surrogate and review of old charts    MDM   53 year old with history of quadriplegia, suprapubic catheter and complex UTI comes in a chief complaint of nausea, vomiting.  He is noted to be febrile, tachycardic and tachypneic.  He was discharged about a week ago after completing a course of ceftazidime.  Questionable COVID-19 infection. Chest x-ray is equivocal.  He has received linezolid and the Covid results are pending.  He will need admission to the hospital.  He has had sepsis in the past, but clinically he is not noted to have sepsis.  I assessed the patient independently after the signout.  Differential diagnosis would include bacteremia, soft tissue infection and PE. If patient remains persistently tachycardic that he might need a PE work-up.      Varney Biles, MD 05/11/19 0058  At 1:30 AM: Patient's BP dropped slightly.  We will give him 30 cc/kg fluid. No change otherwise in his respiratory status.  Patient denies any cough, chest pain.  At 2 AM: Plan of care Covid test is negative.  We will admit him.   Varney Biles, MD 05/11/19 [phone]

## 2019-05-12 MED ORDER — SODIUM CHLORIDE 0.9 % IV SOLN
2.0000 g | Freq: Two times a day (BID) | INTRAVENOUS | Status: DC
  Administered 2019-05-12 – 2019-05-14 (×4): 2 g via INTRAVENOUS
  Filled 2019-05-12 (×5): qty 2

## 2019-05-12 NOTE — Progress Notes (Signed)
PROGRESS NOTE    [name] [name]  [id number] DOB: 09/09/1965 DOA: 05/10/2019 PCP: Andria Frames, PA-C  Outpatient Specialists:   Brief Narrative: Patient is a 53 year old African-American male with past medical history significant for quadriplegia, decubitus ulcers, recurrent UTIs, colostomy, suprapubic catheter, chronic hypotension on midodrine.  Patient was admitted with chills, nausea, vomiting and diarrhea.  Stool has come back positive for C. difficile.  Patient is currently on IV cefepime for possible UTI, but final urine cultures are still pending.  Patient is on oral vancomycin for C. difficile, as well as, mupirocin for MRCA PCR positive nasal swab test.    05/12/2019: Patient symptoms are improving.  Diarrhea is improving.  Nausea and vomiting are resolved significantly.  Several pressure sores are noted.  Will discontinue IV cefepime once urine cultures noted.  I have not visualized any wound cultures.  Assessment & Plan:   Principal Problem:   Sepsis ([place]) Active Problems:   Quadriplegia ([place])   Pressure injury of skin   Acute lower UTI   Sepsis associated hypotension ([name]  Sepsis/SIRS: -Stool is positive for C. Difficile. -Continue oral vancomycin. -Follow urine culture.  Patient has history of recurrent UTI. -Send wound swab for cultures. -Discontinue IV cefepime once urine cultures nonrevealing. -Further management depend on hospital course. -COVID-19 is negative.  Quadriplegia  -Chronic and baseline -Pressure ulcers likely secondary to quadriplegia.  Decubitus ulcers  -Wound care is following patient.  Anemia of chronic inflammation:  -Continue to monitor closely.    Chronic hypotension:  -Continue midodrine.    DVT prophylaxis: lovenox Code Status: Full Family Communication: No family in room Disposition Plan: Home after admit Consults called: None  Procedures:   None  Antimicrobials:   IV cefepime  Oral vancomycin  Mupirocin  ointment   Subjective: No nausea vomiting Diarrhea is improving Chills have resolved  Objective: Vitals:   05/11/19 2319 05/12/19 0356 05/12/19 0819 05/12/19 1200  BP: (!) 137/93 121/83 (!) 127/92 134/87  Pulse: 93 98 97 95  Resp: 18 15 18 18   Temp: 97.9 F (36.6 C) 98.5 F (36.9 C) 98.1 F (36.7 C) 98.9 F (37.2 C)  TempSrc: Oral Oral Oral Oral  SpO2: 95% 95% 94% 97%  Weight:      Height:        Intake/Output Summary (Last 24 hours) at 05/12/2019 1313 Last data filed at 05/12/2019 1200 Gross per 24 hour  Intake 2935.55 ml  Output 1435 ml  Net 1500.55 ml   Filed Weights   05/11/19 0506 05/11/19 0649  Weight: 83 kg 82.2 kg    Examination:  General exam: Appears calm and comfortable.  Functional quadriplegic. Respiratory system: Clear to auscultation. Respiratory effort normal. Cardiovascular system: S1 & S2 heard. Gastrointestinal system: Abdomen is nondistended, soft and nontender.  Central nervous system: Awake and alert.  Patient is functional quadriplegic.   Extremities: Contractures.  Data Reviewed: I have personally reviewed following labs and imaging studies  CBC: Recent Labs  Lab 05/10/19 2230 05/11/19 0527  WBC 5.8 8.2  NEUTROABS 4.4  --   HGB 9.6* 7.6*  HCT 31.9* 25.4*  MCV 87.4 89.4  PLT 400 [id number]   Basic Metabolic Panel: Recent Labs  Lab 05/10/19 2230 05/11/19 0527  NA 139 143  K 4.3 3.8  CL 107 111  CO2 23 23  GLUCOSE 116* 108*  BUN 11 13  CREATININE 0.41* 0.36*  CALCIUM 8.4* 7.1*   GFR: Estimated Creatinine Clearance: 118.6 mL/min (A) (by C-G formula based on  SCr of 0.36 mg/dL (L)). Liver Function Tests: Recent Labs  Lab 05/10/19 2230  AST 24  ALT 17  ALKPHOS 81  BILITOT 0.3  PROT 6.1*  ALBUMIN 2.0*   No results for input(s): LIPASE, AMYLASE in the last 168 hours. No results for input(s): AMMONIA in the last 168 hours. Coagulation Profile: Recent Labs  Lab 05/10/19 2230  INR 1.0   Cardiac Enzymes: No results  for input(s): CKTOTAL, CKMB, CKMBINDEX, TROPONINI in the last 168 hours. BNP (last 3 results) No results for input(s): PROBNP in the last 8760 hours. HbA1C: No results for input(s): HGBA1C in the last 72 hours. CBG: No results for input(s): GLUCAP in the last 168 hours. Lipid Profile: No results for input(s): CHOL, HDL, LDLCALC, TRIG, CHOLHDL, LDLDIRECT in the last 72 hours. Thyroid Function Tests: No results for input(s): TSH, T4TOTAL, FREET4, T3FREE, THYROIDAB in the last 72 hours. Anemia Panel: No results for input(s): VITAMINB12, FOLATE, FERRITIN, TIBC, IRON, RETICCTPCT in the last 72 hours. Urine analysis:    Component Value Date/Time   COLORURINE YELLOW 05/11/2019 0024   APPEARANCEUR CLOUDY (A) 05/11/2019 0024   LABSPEC 1.016 05/11/2019 0024   PHURINE 7.0 05/11/2019 0024   GLUCOSEU NEGATIVE 05/11/2019 0024   HGBUR NEGATIVE 05/11/2019 0024   BILIRUBINUR NEGATIVE 05/11/2019 0024   KETONESUR 5 (A) 05/11/2019 0024   PROTEINUR 100 (A) 05/11/2019 0024   UROBILINOGEN 1.0 03/16/2015 0835   NITRITE NEGATIVE 05/11/2019 0024   LEUKOCYTESUR SMALL (A) 05/11/2019 0024   Sepsis Labs: @LABRCNTIP (procalcitonin:4,lacticidven:4)  ) Recent Results (from the past 240 hour(s))  Blood Culture (routine x 2)     Status: None (Preliminary result)   Collection Time: 05/10/19 10:35 PM   Specimen: BLOOD  Result Value Ref Range Status   Specimen Description BLOOD LEFT ANTECUBITAL  Final   Special Requests   Final    BOTTLES DRAWN AEROBIC AND ANAEROBIC Blood Culture adequate volume   Culture   Final    NO GROWTH 2 DAYS Performed at [facility], [place] [address] [place], [place] [address]    Report Status PENDING  Incomplete  Respiratory Panel by RT PCR (Flu A&B, Covid) - Nasopharyngeal Swab     Status: None   Collection Time: 05/11/19  1:30 AM   Specimen: Nasopharyngeal Swab  Result Value Ref Range Status   SARS Coronavirus 2 by RT PCR NEGATIVE NEGATIVE Final    Comment:  (NOTE) SARS-CoV-2 target nucleic acids are NOT DETECTED. The SARS-CoV-2 RNA is generally detectable in upper respiratoy specimens during the acute phase of infection. The lowest concentration of SARS-CoV-2 viral copies this assay can detect is 131 copies/mL. A negative result does not preclude SARS-Cov-2 infection and should not be used as the sole basis for treatment or other patient management decisions. A negative result may occur with  improper specimen collection/handling, submission of specimen other than nasopharyngeal swab, presence of viral mutation(s) within the areas targeted by this assay, and inadequate number of viral copies (<131 copies/mL). A negative result must be combined with clinical observations, patient history, and epidemiological information. The expected result is Negative. Fact Sheet for Patients:  PinkCheek.be Fact Sheet for Healthcare Providers:  GravelBags.it This test is not yet ap proved or cleared by the [place] FDA and  has been authorized for detection and/or diagnosis of SARS-CoV-2 by FDA under an Emergency Use Authorization (EUA). This EUA will remain  in effect (meaning this test can be used) for the duration of the COVID-19 declaration under Section  564(b)(1) of the Act, 21 U.S.C. section 360bbb-3(b)(1), unless the authorization is terminated or revoked sooner.    Influenza A by PCR NEGATIVE NEGATIVE Final   Influenza B by PCR NEGATIVE NEGATIVE Final    Comment: (NOTE) The Xpert Xpress SARS-CoV-2/FLU/RSV assay is intended as an aid in  the diagnosis of influenza from Nasopharyngeal swab specimens and  should not be used as a sole basis for treatment. Nasal washings and  aspirates are unacceptable for Xpert Xpress SARS-CoV-2/FLU/RSV  testing. Fact Sheet for Patients: PinkCheek.be Fact Sheet for Healthcare  Providers: GravelBags.it This test is not yet approved or cleared by the [place] FDA and  has been authorized for detection and/or diagnosis of SARS-CoV-2 by  FDA under an Emergency Use Authorization (EUA). This EUA will remain  in effect (meaning this test can be used) for the duration of the  Covid-19 declaration under Section 564(b)(1) of the Act, 21  U.S.C. section 360bbb-3(b)(1), unless the authorization is  terminated or revoked. Performed at [facility], [place] [address] [place], [place] [address]   MRSA PCR Screening     Status: Abnormal   Collection Time: 05/11/19  8:48 AM   Specimen: Nasal Mucosa; Nasopharyngeal  Result Value Ref Range Status   MRSA by PCR POSITIVE (A) NEGATIVE Final    Comment:        The GeneXpert MRSA Assay (FDA approved for NASAL specimens only), is one component of a comprehensive MRSA colonization surveillance program. It is not intended to diagnose MRSA infection nor to guide or monitor treatment for MRSA infections. RESULT CALLED TO, READ BACK BY AND VERIFIED WITH: D.MUHORO RN AT 1134 05/11/2019 BY A.DAVIS Performed at [facility], [address], [place], [place] [address]   Urine culture     Status: None (Preliminary result)   Collection Time: 05/11/19 10:11 AM   Specimen: In/Out Cath Urine  Result Value Ref Range Status   Specimen Description IN/OUT CATH URINE  Final   Special Requests NONE  Final   Culture   Final    CULTURE REINCUBATED FOR BETTER GROWTH Performed at [facility], [place] [address] [place], [place] [address]    Report Status PENDING  Incomplete  C difficile quick scan w PCR reflex     Status: Abnormal   Collection Time: 05/11/19  2:14 PM   Specimen: STOOL  Result Value Ref Range Status   C Diff antigen POSITIVE (A) NEGATIVE Final   C Diff toxin NEGATIVE NEGATIVE Final   C Diff interpretation Results are indeterminate. See PCR results.  Final    Comment: Performed  at [facility], [place] [address] [place], [place] [address]  C. Diff by PCR, Reflexed     Status: Abnormal   Collection Time: 05/11/19  3:57 PM  Result Value Ref Range Status   Toxigenic C. Difficile by PCR POSITIVE (A) NEGATIVE Final    Comment: Positive for toxigenic C. difficile with little to no toxin production. Only treat if clinical presentation suggests symptomatic illness. Performed at [facility], [place] [address], [place], [place] [address]          Radiology Studies: DG Chest Port 1 View  Result Date: 05/10/2019 CLINICAL DATA:  Sepsis EXAM: PORTABLE CHEST 1 VIEW COMPARISON:  May 02, 2019 FINDINGS: The heart size and mediastinal contours unchanged. Patchy/hazy airspace opacity seen at the right lung base, with slight interval worsening on the prior exam. The left lung appears to be  well aerated. Healed posterior left rib fractures are noted. IMPRESSION: Interval slight worsening in the patchy airspace opacity at the right lung base which could be due to infectious etiology. Electronically Signed   By: Prudencio Pair M.D.   On: 05/10/2019 22:11        Scheduled Meds: . baclofen  20 mg Oral BID  . Chlorhexidine Gluconate Cloth  6 each Topical Daily  . enoxaparin (LOVENOX) injection  40 mg Subcutaneous Q24H  . feeding supplement (PRO-STAT SUGAR FREE 64)  30 mL Oral BID  . ferrous sulfate  325 mg Oral Q breakfast  . fesoterodine  8 mg Oral Daily  . magnesium oxide  400 mg Oral Q breakfast  . metoCLOPramide  10 mg Oral TID WC  . midodrine  10 mg Oral TID WC  . multivitamin with minerals  1 tablet Oral Daily  . mupirocin ointment  1 application Nasal BID  . nutrition supplement (JUVEN)  1 packet Oral BID BM  . pantoprazole  40 mg Oral BID  . Ensure Max Protein  11 oz Oral Daily  . sucralfate  1 g Oral QID  . vancomycin  125 mg Oral QID   Continuous Infusions: . sodium chloride 125 mL/hr at 05/12/19 0933  . ceFEPime (MAXIPIME) IV Stopped (05/12/19 1005)      LOS: 1 day    Time spent: 6 Minutes    Dana Allan, MD  [facility] Pager #: [phone] 7PM-7AM contact night coverage as above

## 2019-05-12 NOTE — Progress Notes (Signed)
Pharmacy Antibiotic Note  [name] [name] is a 53 y.o. male admitted on 05/10/2019 with UTI with signs of sepsis.  Pharmacy has been consulted for cefepime dosing.  Of note pt has recent h/o MDR Pseudomonas in urine, sensitive to cefepime and Zosyn, as well as blood cx in Nov w/ MDR acinetobacter.  Plan: Adjust Cefepime to 2g IV Q12H - UTI, not septic.   Temp (24hrs), Avg:98.4 F (36.9 C), Min:97.9 F (36.6 C), Max:98.9 F (37.2 C)  Recent Labs  Lab 05/10/19 2230 05/11/19 0527 05/11/19 1304  WBC 5.8 8.2  --   CREATININE 0.41* 0.36*  --   LATICACIDVEN 1.7  --  1.2    Estimated Creatinine Clearance: 118.6 mL/min (A) (by C-G formula based on SCr of 0.36 mg/dL (L)).    Allergies  Allergen Reactions  . Ferumoxytol Anaphylaxis and Other (See Comments)    (Feraheme) = "SYNCOPE; patient tolerated Venofer 10/01/18 s rxn"   . Oxybutynin Other (See Comments)    Hallucinations    . Vancomycin Other (See Comments)    ARF 05-2016 -- affects kidneys     Thank you for allowing pharmacy to be a part of this patient's care.  Sloan Leiter, PharmD, BCPS, BCCCP Clinical Pharmacist Please refer to [facility] for [place] numbers 05/12/2019 2:41 PM

## 2019-05-13 LAB — CBC
HCT: 25.2 % — ABNORMAL LOW (ref 39.0–52.0)
Hemoglobin: 7.7 g/dL — ABNORMAL LOW (ref 13.0–17.0)
MCH: 26.1 pg (ref 26.0–34.0)
MCHC: 30.6 g/dL (ref 30.0–36.0)
MCV: 85.4 fL (ref 80.0–100.0)
Platelets: 363 10*3/uL (ref 150–400)
RBC: 2.95 MIL/uL — ABNORMAL LOW (ref 4.22–5.81)
RDW: 16.4 % — ABNORMAL HIGH (ref 11.5–15.5)
WBC: 5.9 10*3/uL (ref 4.0–10.5)
nRBC: 0 % (ref 0.0–0.2)

## 2019-05-13 LAB — BASIC METABOLIC PANEL
Anion gap: 10 (ref 5–15)
BUN: 7 mg/dL (ref 6–20)
CO2: 20 mmol/L — ABNORMAL LOW (ref 22–32)
Calcium: 7.6 mg/dL — ABNORMAL LOW (ref 8.9–10.3)
Chloride: 110 mmol/L (ref 98–111)
Creatinine, Ser: <0.3 mg/dL — ABNORMAL LOW (ref 0.61–1.24)
Glucose, Bld: 101 mg/dL — ABNORMAL HIGH (ref 70–99)
Potassium: 4.2 mmol/L (ref 3.5–5.1)
Sodium: 140 mmol/L (ref 135–145)

## 2019-05-13 MED ORDER — SODIUM CHLORIDE 0.9% FLUSH
10.0000 mL | Freq: Two times a day (BID) | INTRAVENOUS | Status: DC
  Administered 2019-05-13 – 2019-05-15 (×4): 10 mL
  Administered 2019-05-15: 20 mL
  Administered 2019-05-16 – 2019-05-22 (×11): 10 mL

## 2019-05-13 MED ORDER — SODIUM CHLORIDE 0.9% FLUSH
10.0000 mL | INTRAVENOUS | Status: DC | PRN
  Administered 2019-05-14: 10 mL

## 2019-05-13 NOTE — Plan of Care (Signed)

## 2019-05-13 NOTE — Progress Notes (Signed)
PROGRESS NOTE    [name] [name]  [id number] DOB: 1966/05/16 DOA: 05/10/2019 PCP: Andria Frames, PA-C   Brief Narrative: 53 year old African-American male with quadriplegia, decubitus ulcers, recurrent UTI, colostomy/suprapubic catheter, chronic hypotension on midodrine admitted with chills nausea vomiting and diarrhea.,  Stool tested positive for C. difficile and on oral vancomycin. 12/27 nausea vomiting resolved, several pressure sores were noted  Subjective: Resting comfortably denies any nausea vomiting fever chills. Saturating well on room air blood pressure 110, T-max 100.2 earlier this morning Labs pending Overnight noticed leaking of suprapubic catheter.  Assessment & Plan:  Chills nausea and vomiting suspected sepsis/SIRS: History positive for C. difficile and on oral vancomycin. this morning stool appears soft on colostomy.  Having low-grade fever at 100.2.  Blood culture no growth so far.  Urine culture re-incubated for better growth.  Remains on IV cefepime.  Has history of  MDRO  C. difficile + with Antigen and toxin neg- pt has been started on oral vancomycin 12/26.  At this time he still appears soft on the colostomy bag.  Nausea vomiting overall symptoms improved.  Continue diet.  Quadriplegia: Continue supportive care, frequent position changes, cont home baclofen, Reglan.  Pressure injury of skin wound care following.  Wound culture PENDING  Anemia of chronic disease hemoglobin in 7 to 8 g.  Repeating labs.  Chronic hypotension on midodrine.  COVID 19 Neg.  Body mass index is 24.58 kg/m.   DVT prophylaxis: lovenox Code Status: full code Family Communication: plan of care discussed with patient, no family at bedside.  Patient reports his sister has a Covid since Wednesday and is unable to return home. Disposition Plan: Remains inpatient pending clinical improvement.  Consultants: None  Procedures:none Microbiology: C. difficile antigen  positive urine culture reintubated blood culture negative so far  Antimicrobials: Anti-infectives (From admission, onward)   Start     Dose/Rate Route Frequency Ordered Stop   05/12/19 2200  ceFEPIme (MAXIPIME) 2 g in sodium chloride 0.9 % 100 mL IVPB     2 g 200 mL/hr over 30 Minutes Intravenous Every 12 hours 05/12/19 1440     05/11/19 1930  vancomycin (VANCOCIN) 50 mg/mL oral solution 125 mg     125 mg Oral 4 times daily 05/11/19 1916 05/21/19 1759   05/11/19 1000  ceFEPIme (MAXIPIME) 2 g in sodium chloride 0.9 % 100 mL IVPB  Status:  Discontinued     2 g 200 mL/hr over 30 Minutes Intravenous Every 8 hours 05/11/19 0245 05/12/19 1440   05/11/19 0245  ceFEPIme (MAXIPIME) 2 g in sodium chloride 0.9 % 100 mL IVPB     2 g 200 mL/hr over 30 Minutes Intravenous  Once 05/11/19 0232 05/11/19 0419   05/10/19 2330  meropenem (MERREM) 1 g in sodium chloride 0.9 % 100 mL IVPB     1 g 200 mL/hr over 30 Minutes Intravenous  Once 05/10/19 2319 05/11/19 0121   05/10/19 2330  linezolid (ZYVOX) IVPB 600 mg  Status:  Discontinued     600 mg 300 mL/hr over 60 Minutes Intravenous Every 12 hours 05/10/19 2319 05/10/19 2331   05/10/19 2330  linezolid (ZYVOX) IVPB 600 mg     600 mg 300 mL/hr over 60 Minutes Intravenous  Once 05/10/19 2322 05/11/19 0226       Objective: Vitals:   05/12/19 1943 05/12/19 2319 05/13/19 0359 05/13/19 0755  BP: 125/87 124/89 108/73 110/82  Pulse: (!) 103 (!) 105 100 97  Resp: 18 19 (!) 29 (!)  27  Temp: 100.1 F (37.8 C) 99.8 F (37.7 C) 100.2 F (37.9 C) 99 F (37.2 C)  TempSrc: Oral Oral Axillary Oral  SpO2: 98% 92% 92% 97%  Weight:      Height:        Intake/Output Summary (Last 24 hours) at 05/13/2019 1217 Last data filed at 05/13/2019 1000 Gross per 24 hour  Intake 2591.67 ml  Output 1600 ml  Net 991.67 ml   Filed Weights   05/11/19 0506 05/11/19 0649  Weight: 83 kg 82.2 kg   Weight change:   Body mass index is 24.58 kg/m.  Intake/Output from  previous day: 12/27 0701 - 12/28 0700 In: 3458.9 [P.O.:960; I.V.:2298.9; IV Piggyback:200] Out: 1600 [Urine:1600] Intake/Output this shift: Total I/O In: 10 [I.V.:10] Out: -   Examination:  General exam: AAOx3,NAD, Weak appearing. HEENT:Oral mucosa moist, Ear/Nose WNL grossly, dentition normal. Respiratory system: Diminished at the base,no wheezing or crackles,no use of accessory muscle Cardiovascular system: S1 & S2 +, No JVD,. Gastrointestinal system: Abdomen soft, NT,ND, BS+, colostomy present with soft stool, suprapubic catheter present Nervous System:Alert, awake, contractures presents UE/LE, leg edema+ Extremities: ankle dema, warm legs Skin: No rashes,no icterus. MSK: Normal muscle bulk,tone, power  Medications:  Scheduled Meds: . baclofen  20 mg Oral BID  . Chlorhexidine Gluconate Cloth  6 each Topical Daily  . enoxaparin (LOVENOX) injection  40 mg Subcutaneous Q24H  . feeding supplement (PRO-STAT SUGAR FREE 64)  30 mL Oral BID  . ferrous sulfate  325 mg Oral Q breakfast  . fesoterodine  8 mg Oral Daily  . magnesium oxide  400 mg Oral Q breakfast  . metoCLOPramide  10 mg Oral TID WC  . midodrine  10 mg Oral TID WC  . multivitamin with minerals  1 tablet Oral Daily  . mupirocin ointment  1 application Nasal BID  . nutrition supplement (JUVEN)  1 packet Oral BID BM  . pantoprazole  40 mg Oral BID  . Ensure Max Protein  11 oz Oral Daily  . sodium chloride flush  10-40 mL Intracatheter Q12H  . sucralfate  1 g Oral QID  . vancomycin  125 mg Oral QID   Continuous Infusions: . sodium chloride 125 mL/hr at 05/13/19 0926  . ceFEPime (MAXIPIME) IV 2 g (05/13/19 0941)    Data Reviewed: I have personally reviewed following labs and imaging studies  CBC: Recent Labs  Lab 05/10/19 2230 05/11/19 0527  WBC 5.8 8.2  NEUTROABS 4.4  --   HGB 9.6* 7.6*  HCT 31.9* 25.4*  MCV 87.4 89.4  PLT 400 [id number]   Basic Metabolic Panel: Recent Labs  Lab 05/10/19 2230  05/11/19 0527  NA 139 143  K 4.3 3.8  CL 107 111  CO2 23 23  GLUCOSE 116* 108*  BUN 11 13  CREATININE 0.41* 0.36*  CALCIUM 8.4* 7.1*   GFR: Estimated Creatinine Clearance: 118.6 mL/min (A) (by C-G formula based on SCr of 0.36 mg/dL (L)). Liver Function Tests: Recent Labs  Lab 05/10/19 2230  AST 24  ALT 17  ALKPHOS 81  BILITOT 0.3  PROT 6.1*  ALBUMIN 2.0*   No results for input(s): LIPASE, AMYLASE in the last 168 hours. No results for input(s): AMMONIA in the last 168 hours. Coagulation Profile: Recent Labs  Lab 05/10/19 2230  INR 1.0   Cardiac Enzymes: No results for input(s): CKTOTAL, CKMB, CKMBINDEX, TROPONINI in the last 168 hours. BNP (last 3 results) No results for input(s): PROBNP in the last 8760  hours. HbA1C: No results for input(s): HGBA1C in the last 72 hours. CBG: No results for input(s): GLUCAP in the last 168 hours. Lipid Profile: No results for input(s): CHOL, HDL, LDLCALC, TRIG, CHOLHDL, LDLDIRECT in the last 72 hours. Thyroid Function Tests: No results for input(s): TSH, T4TOTAL, FREET4, T3FREE, THYROIDAB in the last 72 hours. Anemia Panel: No results for input(s): VITAMINB12, FOLATE, FERRITIN, TIBC, IRON, RETICCTPCT in the last 72 hours. Sepsis Labs: Recent Labs  Lab 05/10/19 2230 05/11/19 1304  LATICACIDVEN 1.7 1.2    Recent Results (from the past 240 hour(s))  Blood Culture (routine x 2)     Status: None (Preliminary result)   Collection Time: 05/10/19 10:35 PM   Specimen: BLOOD  Result Value Ref Range Status   Specimen Description BLOOD LEFT ANTECUBITAL  Final   Special Requests   Final    BOTTLES DRAWN AEROBIC AND ANAEROBIC Blood Culture adequate volume   Culture   Final    NO GROWTH 2 DAYS Performed at [facility], [address], [place], [place] [address]    Report Status PENDING  Incomplete  Respiratory Panel by RT PCR (Flu A&B, Covid) - Nasopharyngeal Swab     Status: None   Collection Time: 05/11/19  1:30 AM    Specimen: Nasopharyngeal Swab  Result Value Ref Range Status   SARS Coronavirus 2 by RT PCR NEGATIVE NEGATIVE Final    Comment: (NOTE) SARS-CoV-2 target nucleic acids are NOT DETECTED. The SARS-CoV-2 RNA is generally detectable in upper respiratoy specimens during the acute phase of infection. The lowest concentration of SARS-CoV-2 viral copies this assay can detect is 131 copies/mL. A negative result does not preclude SARS-Cov-2 infection and should not be used as the sole basis for treatment or other patient management decisions. A negative result may occur with  improper specimen collection/handling, submission of specimen other than nasopharyngeal swab, presence of viral mutation(s) within the areas targeted by this assay, and inadequate number of viral copies (<131 copies/mL). A negative result must be combined with clinical observations, patient history, and epidemiological information. The expected result is Negative. Fact Sheet for Patients:  PinkCheek.be Fact Sheet for Healthcare Providers:  GravelBags.it This test is not yet ap proved or cleared by the [place] FDA and  has been authorized for detection and/or diagnosis of SARS-CoV-2 by FDA under an Emergency Use Authorization (EUA). This EUA will remain  in effect (meaning this test can be used) for the duration of the COVID-19 declaration under Section 564(b)(1) of the Act, 21 U.S.C. section 360bbb-3(b)(1), unless the authorization is terminated or revoked sooner.    Influenza A by PCR NEGATIVE NEGATIVE Final   Influenza B by PCR NEGATIVE NEGATIVE Final    Comment: (NOTE) The Xpert Xpress SARS-CoV-2/FLU/RSV assay is intended as an aid in  the diagnosis of influenza from Nasopharyngeal swab specimens and  should not be used as a sole basis for treatment. Nasal washings and  aspirates are unacceptable for Xpert Xpress SARS-CoV-2/FLU/RSV  testing. Fact Sheet  for Patients: PinkCheek.be Fact Sheet for Healthcare Providers: GravelBags.it This test is not yet approved or cleared by the [place] FDA and  has been authorized for detection and/or diagnosis of SARS-CoV-2 by  FDA under an Emergency Use Authorization (EUA). This EUA will remain  in effect (meaning this test can be used) for the duration of the  Covid-19 declaration under Section 564(b)(1) of the Act, 21  U.S.C. section 360bbb-3(b)(1), unless the authorization is  terminated or revoked. Performed  at [facility], [place] [address], [place], [place] [address]   MRSA PCR Screening     Status: Abnormal   Collection Time: 05/11/19  8:48 AM   Specimen: Nasal Mucosa; Nasopharyngeal  Result Value Ref Range Status   MRSA by PCR POSITIVE (A) NEGATIVE Final    Comment:        The GeneXpert MRSA Assay (FDA approved for NASAL specimens only), is one component of a comprehensive MRSA colonization surveillance program. It is not intended to diagnose MRSA infection nor to guide or monitor treatment for MRSA infections. RESULT CALLED TO, READ BACK BY AND VERIFIED WITH: D.MUHORO RN AT 1134 05/11/2019 BY A.DAVIS Performed at [facility], [address], [place], [place] [address]   Urine culture     Status: None (Preliminary result)   Collection Time: 05/11/19 10:11 AM   Specimen: In/Out Cath Urine  Result Value Ref Range Status   Specimen Description IN/OUT CATH URINE  Final   Special Requests NONE  Final   Culture   Final    CULTURE REINCUBATED FOR BETTER GROWTH Performed at [facility], [place] [address], [place], [place] [address]    Report Status PENDING  Incomplete  C difficile quick scan w PCR reflex     Status: Abnormal   Collection Time: 05/11/19  2:14 PM   Specimen: STOOL  Result Value Ref Range Status   C Diff antigen POSITIVE (A) NEGATIVE Final   C Diff toxin NEGATIVE NEGATIVE Final   C Diff  interpretation Results are indeterminate. See PCR results.  Final    Comment: Performed at [facility], [place] [address], [place], [place] [address]  C. Diff by PCR, Reflexed     Status: Abnormal   Collection Time: 05/11/19  3:57 PM  Result Value Ref Range Status   Toxigenic C. Difficile by PCR POSITIVE (A) NEGATIVE Final    Comment: Positive for toxigenic C. difficile with little to no toxin production. Only treat if clinical presentation suggests symptomatic illness. Performed at [facility], [place] [address], [place], [place] [address]   Aerobic Culture (superficial specimen)     Status: None (Preliminary result)   Collection Time: 05/12/19  1:22 PM   Specimen: Wound  Result Value Ref Range Status   Specimen Description WOUND  Final   Special Requests NONE  Final   Gram Stain   Final    NO WBC SEEN RARE GRAM POSITIVE COCCI IN PAIRS FEW GRAM POSITIVE RODS    Culture   Final    CULTURE REINCUBATED FOR BETTER GROWTH Performed at [facility], [place] [address] [place],  [address]    Report Status PENDING  Incomplete      Radiology Studies: No results found.    LOS: 2 days   Time spent: More than 50% of that time was spent in counseling and/or coordination of care.  Antonieta Pert, MD [facility]  05/13/2019, 12:17 PM

## 2019-05-13 NOTE — Progress Notes (Signed)
Pt suprapubic catheter is leaking. MD on call paged, will continue to monitor.

## 2019-05-14 DIAGNOSIS — B999 Unspecified infectious disease: Secondary | ICD-10-CM

## 2019-05-14 DIAGNOSIS — Z515 Encounter for palliative care: Secondary | ICD-10-CM

## 2019-05-14 DIAGNOSIS — Z7189 Other specified counseling: Secondary | ICD-10-CM

## 2019-05-14 LAB — BASIC METABOLIC PANEL
Anion gap: 9 (ref 5–15)
BUN: <5 mg/dL — ABNORMAL LOW (ref 6–20)
CO2: 24 mmol/L (ref 22–32)
Calcium: 7.7 mg/dL — ABNORMAL LOW (ref 8.9–10.3)
Chloride: 107 mmol/L (ref 98–111)
Creatinine, Ser: <0.3 mg/dL — ABNORMAL LOW (ref 0.61–1.24)
Glucose, Bld: 85 mg/dL (ref 70–99)
Potassium: 2.6 mmol/L — CL (ref 3.5–5.1)
Sodium: 140 mmol/L (ref 135–145)

## 2019-05-14 LAB — CBC
HCT: 25.1 % — ABNORMAL LOW (ref 39.0–52.0)
Hemoglobin: 7.8 g/dL — ABNORMAL LOW (ref 13.0–17.0)
MCH: 26.4 pg (ref 26.0–34.0)
MCHC: 31.1 g/dL (ref 30.0–36.0)
MCV: 84.8 fL (ref 80.0–100.0)
Platelets: 363 10*3/uL (ref 150–400)
RBC: 2.96 MIL/uL — ABNORMAL LOW (ref 4.22–5.81)
RDW: 16.4 % — ABNORMAL HIGH (ref 11.5–15.5)
WBC: 6.8 10*3/uL (ref 4.0–10.5)
nRBC: 0 % (ref 0.0–0.2)

## 2019-05-14 LAB — URINE CULTURE

## 2019-05-14 LAB — AEROBIC CULTURE W GRAM STAIN (SUPERFICIAL SPECIMEN): Gram Stain: NONE SEEN

## 2019-05-14 MED ORDER — POTASSIUM CHLORIDE CRYS ER 20 MEQ PO TBCR
40.0000 meq | EXTENDED_RELEASE_TABLET | ORAL | Status: AC
  Administered 2019-05-14 (×3): 40 meq via ORAL
  Filled 2019-05-14 (×3): qty 2

## 2019-05-14 NOTE — Progress Notes (Signed)
PROGRESS NOTE    [name] [name]  [id number] DOB: 06-25-1965 DOA: 05/10/2019 PCP: Andria Frames, PA-C     Brief Narrative:  [name] [name] is a 53 year old African-American male with history of quadriplegia, chronic decubitus ulcers, recurrent UTI, colostomy and chronic suprapubic catheter, chronic hypotension on midodrine admitted with chills, nausea, vomiting and diarrhea. Stool tested positive for C. difficile and on oral vancomycin. He was started on cefepime due to concern for UTI.   New events last 24 hours / Subjective: Doing well this morning.  He has no complaints.  Tolerating diet and no longer having any nausea, vomiting.  Stool has formed.  Denies any abdominal pain today.  Afebrile.  Assessment & Plan:   Principal Problem:   Sepsis ([place]) Active Problems:   Quadriplegia ([place])   Pressure injury of skin   Acute lower UTI   Sepsis associated hypotension ([name]   Sepsis secondary to C. difficile -On oral vancomycin -Blood cultures negative -Stool is formed and soft in the colostomy bag  Bacteriuria -Urine culture revealed multiple organisms present -Stop cefepime  Quadriplegia with chronic colostomy, suprapubic catheter use -Continue home baclofen, Reglan  Chronic hypotension -Continue midodrine  Hypokalemia -Replace, trend   In agreement with assessment of the pressure ulcer as below:  Pressure Injury 03/06/19 Flank Right;Lower;Anterior Stage III -  Full thickness tissue loss. Subcutaneous fat may be visible but bone, tendon or muscle are NOT exposed. pink, yellow, slough (Active)  03/06/19 1115  Location: Flank  Location Orientation: Right;Lower;Anterior  Staging: Stage III -  Full thickness tissue loss. Subcutaneous fat may be visible but bone, tendon or muscle are NOT exposed.  Wound Description (Comments): pink, yellow, slough  Present on Admission: Yes     Pressure Injury 03/06/19 Shoulder Posterior;Right;Upper Stage III -  Full thickness tissue  loss. Subcutaneous fat may be visible but bone, tendon or muscle are NOT exposed. Pink, Red (Active)  03/06/19 1115  Location: Shoulder  Location Orientation: Posterior;Right;Upper  Staging: Stage III -  Full thickness tissue loss. Subcutaneous fat may be visible but bone, tendon or muscle are NOT exposed.  Wound Description (Comments): Pink, Red  Present on Admission: Yes     Pressure Injury 03/06/19 Hip Posterior;Right;Lateral Stage III -  Full thickness tissue loss. Subcutaneous fat may be visible but bone, tendon or muscle are NOT exposed. Yellow, tan, slough (Active)  03/06/19 1115  Location: Hip  Location Orientation: Posterior;Right;Lateral  Staging: Stage III -  Full thickness tissue loss. Subcutaneous fat may be visible but bone, tendon or muscle are NOT exposed.  Wound Description (Comments): Yellow, tan, slough  Present on Admission: Yes     Pressure Injury 03/06/19 Thigh Anterior;Right;Upper Stage IV - Full thickness tissue loss with exposed bone, tendon or muscle. Pink, red, slough (Active)  03/06/19 1115  Location: Thigh  Location Orientation: Anterior;Right;Upper  Staging: Stage IV - Full thickness tissue loss with exposed bone, tendon or muscle.  Wound Description (Comments): Pink, red, slough  Present on Admission: Yes     Pressure Injury 03/06/19 Buttocks Right;Left;Medial Stage IV - Full thickness tissue loss with exposed bone, tendon or muscle. Red (Active)  03/06/19 1115  Location: Buttocks  Location Orientation: Right;Left;Medial  Staging: Stage IV - Full thickness tissue loss with exposed bone, tendon or muscle.  Wound Description (Comments): Red  Present on Admission: Yes     Pressure Injury 03/06/19 Foot Anterior;Left;Lateral Stage II -  Partial thickness loss of dermis presenting as a shallow open ulcer with a  red, pink wound bed without slough. Red (Active)  03/06/19 1115  Location: Foot  Location Orientation: Anterior;Left;Lateral  Staging: Stage II -   Partial thickness loss of dermis presenting as a shallow open ulcer with a red, pink wound bed without slough.  Wound Description (Comments): Red  Present on Admission: Yes     Pressure Injury 03/27/19 Hip Anterior;Left Stage III -  Full thickness tissue loss. Subcutaneous fat may be visible but bone, tendon or muscle are NOT exposed. pink;yellow (Active)  03/27/19 1217  Location: Hip  Location Orientation: Anterior;Left  Staging: Stage III -  Full thickness tissue loss. Subcutaneous fat may be visible but bone, tendon or muscle are NOT exposed.  Wound Description (Comments): pink;yellow  Present on Admission: No     Pressure Injury 03/27/19 Rib Right;Posterior Stage III -  Full thickness tissue loss. Subcutaneous fat may be visible but bone, tendon or muscle are NOT exposed. pink;red;yellow (Active)  03/27/19 1219  Location: Rib  Location Orientation: Right;Posterior  Staging: Stage III -  Full thickness tissue loss. Subcutaneous fat may be visible but bone, tendon or muscle are NOT exposed.  Wound Description (Comments): pink;red;yellow  Present on Admission: No      DVT prophylaxis: Lovenox Code Status: Full Family Communication: None at bedside Disposition Plan: Pending improvement, discharge home    Consultants:   Palliative care    Antimicrobials:  Anti-infectives (From admission, onward)   Start     Dose/Rate Route Frequency Ordered Stop   05/12/19 2200  ceFEPIme (MAXIPIME) 2 g in sodium chloride 0.9 % 100 mL IVPB  Status:  Discontinued     2 g 200 mL/hr over 30 Minutes Intravenous Every 12 hours 05/12/19 1440 05/14/19 1014   05/11/19 1930  vancomycin (VANCOCIN) 50 mg/mL oral solution 125 mg     125 mg Oral 4 times daily 05/11/19 1916 05/21/19 1759   05/11/19 1000  ceFEPIme (MAXIPIME) 2 g in sodium chloride 0.9 % 100 mL IVPB  Status:  Discontinued     2 g 200 mL/hr over 30 Minutes Intravenous Every 8 hours 05/11/19 0245 05/12/19 1440   05/11/19 0245  ceFEPIme  (MAXIPIME) 2 g in sodium chloride 0.9 % 100 mL IVPB     2 g 200 mL/hr over 30 Minutes Intravenous  Once 05/11/19 0232 05/11/19 0419   05/10/19 2330  meropenem (MERREM) 1 g in sodium chloride 0.9 % 100 mL IVPB     1 g 200 mL/hr over 30 Minutes Intravenous  Once 05/10/19 2319 05/11/19 0121   05/10/19 2330  linezolid (ZYVOX) IVPB 600 mg  Status:  Discontinued     600 mg 300 mL/hr over 60 Minutes Intravenous Every 12 hours 05/10/19 2319 05/10/19 2331   05/10/19 2330  linezolid (ZYVOX) IVPB 600 mg     600 mg 300 mL/hr over 60 Minutes Intravenous  Once 05/10/19 2322 05/11/19 0226        Objective: Vitals:   05/13/19 2300 05/14/19 0321 05/14/19 0801 05/14/19 0900  BP: 120/78 99/74 96/61    Pulse: 91 97 100 100  Resp: (!) 21 (!) 32 20 20  Temp: 99.3 F (37.4 C) 98.9 F (37.2 C) 98.3 F (36.8 C)   TempSrc: Oral Oral Oral   SpO2: 93% 96% 95% 91%  Weight:      Height:        Intake/Output Summary (Last 24 hours) at 05/14/2019 1049 Last data filed at 05/14/2019 1015 Gross per 24 hour  Intake 3138.59 ml  Output 3450  ml  Net -311.41 ml   Filed Weights   05/11/19 0506 05/11/19 0649  Weight: 83 kg 82.2 kg    Examination:  General exam: Appears calm and comfortable  Respiratory system: Clear to auscultation. Respiratory effort normal. No respiratory distress. No conversational dyspnea.  Cardiovascular system: S1 & S2 heard, RRR. No murmurs. No pedal edema. Gastrointestinal system: Abdomen is nondistended, soft and nontender. Normal bowel sounds heard. + Colostomy bag with formed stool Central nervous system: Alert and oriented Extremities: Contractures of extremities Psychiatry: Judgement and insight appear normal. Mood & affect appropriate.   Data Reviewed: I have personally reviewed following labs and imaging studies  CBC: Recent Labs  Lab 05/10/19 2230 05/11/19 0527 05/13/19 1502 05/14/19 0753  WBC 5.8 8.2 5.9 6.8  NEUTROABS 4.4  --   --   --   HGB 9.6* 7.6* 7.7*  7.8*  HCT 31.9* 25.4* 25.2* 25.1*  MCV 87.4 89.4 85.4 84.8  PLT 400 345 363 [id number]   Basic Metabolic Panel: Recent Labs  Lab 05/10/19 2230 05/11/19 0527 05/13/19 1328 05/14/19 0753  NA 139 143 140 140  K 4.3 3.8 4.2 2.6*  CL 107 111 110 107  CO2 23 23 20* 24  GLUCOSE 116* 108* 101* 85  BUN 11 13 7  <5*  CREATININE 0.41* 0.36* <0.30* <0.30*  CALCIUM 8.4* 7.1* 7.6* 7.7*   GFR: CrCl cannot be calculated (This lab value cannot be used to calculate CrCl because it is not a number: <0.30). Liver Function Tests: Recent Labs  Lab 05/10/19 2230  AST 24  ALT 17  ALKPHOS 81  BILITOT 0.3  PROT 6.1*  ALBUMIN 2.0*   No results for input(s): LIPASE, AMYLASE in the last 168 hours. No results for input(s): AMMONIA in the last 168 hours. Coagulation Profile: Recent Labs  Lab 05/10/19 2230  INR 1.0   Cardiac Enzymes: No results for input(s): CKTOTAL, CKMB, CKMBINDEX, TROPONINI in the last 168 hours. BNP (last 3 results) No results for input(s): PROBNP in the last 8760 hours. HbA1C: No results for input(s): HGBA1C in the last 72 hours. CBG: No results for input(s): GLUCAP in the last 168 hours. Lipid Profile: No results for input(s): CHOL, HDL, LDLCALC, TRIG, CHOLHDL, LDLDIRECT in the last 72 hours. Thyroid Function Tests: No results for input(s): TSH, T4TOTAL, FREET4, T3FREE, THYROIDAB in the last 72 hours. Anemia Panel: No results for input(s): VITAMINB12, FOLATE, FERRITIN, TIBC, IRON, RETICCTPCT in the last 72 hours. Sepsis Labs: Recent Labs  Lab 05/10/19 2230 05/11/19 1304  LATICACIDVEN 1.7 1.2    Recent Results (from the past 240 hour(s))  Blood Culture (routine x 2)     Status: None (Preliminary result)   Collection Time: 05/10/19 10:35 PM   Specimen: BLOOD  Result Value Ref Range Status   Specimen Description BLOOD LEFT ANTECUBITAL  Final   Special Requests   Final    BOTTLES DRAWN AEROBIC AND ANAEROBIC Blood Culture adequate volume   Culture   Final    NO  GROWTH 3 DAYS Performed at [facility], [address] [place], [place] [address]    Report Status PENDING  Incomplete  Respiratory Panel by RT PCR (Flu A&B, Covid) - Nasopharyngeal Swab     Status: None   Collection Time: 05/11/19  1:30 AM   Specimen: Nasopharyngeal Swab  Result Value Ref Range Status   SARS Coronavirus 2 by RT PCR NEGATIVE NEGATIVE Final    Comment: (NOTE) SARS-CoV-2 target nucleic acids are NOT DETECTED. The  SARS-CoV-2 RNA is generally detectable in upper respiratoy specimens during the acute phase of infection. The lowest concentration of SARS-CoV-2 viral copies this assay can detect is 131 copies/mL. A negative result does not preclude SARS-Cov-2 infection and should not be used as the sole basis for treatment or other patient management decisions. A negative result may occur with  improper specimen collection/handling, submission of specimen other than nasopharyngeal swab, presence of viral mutation(s) within the areas targeted by this assay, and inadequate number of viral copies (<131 copies/mL). A negative result must be combined with clinical observations, patient history, and epidemiological information. The expected result is Negative. Fact Sheet for Patients:  PinkCheek.be Fact Sheet for Healthcare Providers:  GravelBags.it This test is not yet ap proved or cleared by the [place] FDA and  has been authorized for detection and/or diagnosis of SARS-CoV-2 by FDA under an Emergency Use Authorization (EUA). This EUA will remain  in effect (meaning this test can be used) for the duration of the COVID-19 declaration under Section 564(b)(1) of the Act, 21 U.S.C. section 360bbb-3(b)(1), unless the authorization is terminated or revoked sooner.    Influenza A by PCR NEGATIVE NEGATIVE Final   Influenza B by PCR NEGATIVE NEGATIVE Final    Comment: (NOTE) The Xpert Xpress SARS-CoV-2/FLU/RSV  assay is intended as an aid in  the diagnosis of influenza from Nasopharyngeal swab specimens and  should not be used as a sole basis for treatment. Nasal washings and  aspirates are unacceptable for Xpert Xpress SARS-CoV-2/FLU/RSV  testing. Fact Sheet for Patients: PinkCheek.be Fact Sheet for Healthcare Providers: GravelBags.it This test is not yet approved or cleared by the [place] FDA and  has been authorized for detection and/or diagnosis of SARS-CoV-2 by  FDA under an Emergency Use Authorization (EUA). This EUA will remain  in effect (meaning this test can be used) for the duration of the  Covid-19 declaration under Section 564(b)(1) of the Act, 21  U.S.C. section 360bbb-3(b)(1), unless the authorization is  terminated or revoked. Performed at [facility], [place] [address], [place], [place] [address]   MRSA PCR Screening     Status: Abnormal   Collection Time: 05/11/19  8:48 AM   Specimen: Nasal Mucosa; Nasopharyngeal  Result Value Ref Range Status   MRSA by PCR POSITIVE (A) NEGATIVE Final    Comment:        The GeneXpert MRSA Assay (FDA approved for NASAL specimens only), is one component of a comprehensive MRSA colonization surveillance program. It is not intended to diagnose MRSA infection nor to guide or monitor treatment for MRSA infections. RESULT CALLED TO, READ BACK BY AND VERIFIED WITH: D.MUHORO RN AT 1134 05/11/2019 BY A.DAVIS Performed at [facility], [address] [place], [place] [address]   Urine culture     Status: Abnormal   Collection Time: 05/11/19 10:11 AM   Specimen: In/Out Cath Urine  Result Value Ref Range Status   Specimen Description IN/OUT CATH URINE  Final   Special Requests   Final    NONE Performed at [name], [place] [address], [place], [place] [address]    Culture MULTIPLE SPECIES PRESENT, SUGGEST RECOLLECTION (A)  Final   Report Status 05/14/2019  FINAL  Final  C difficile quick scan w PCR reflex     Status: Abnormal   Collection Time: 05/11/19  2:14 PM   Specimen: STOOL  Result Value Ref Range Status   C Diff antigen POSITIVE (A) NEGATIVE Final  C Diff toxin NEGATIVE NEGATIVE Final   C Diff interpretation Results are indeterminate. See PCR results.  Final    Comment: Performed at [facility], [place] [address] [place], [place] [address]  C. Diff by PCR, Reflexed     Status: Abnormal   Collection Time: 05/11/19  3:57 PM  Result Value Ref Range Status   Toxigenic C. Difficile by PCR POSITIVE (A) NEGATIVE Final    Comment: Positive for toxigenic C. difficile with little to no toxin production. Only treat if clinical presentation suggests symptomatic illness. Performed at [facility], [place] [address] [place], [place] [address]   Aerobic Culture (superficial specimen)     Status: Abnormal   Collection Time: 05/12/19  1:22 PM   Specimen: Wound  Result Value Ref Range Status   Specimen Description WOUND  Final   Special Requests NONE  Final   Gram Stain   Final    NO WBC SEEN RARE GRAM POSITIVE COCCI IN PAIRS FEW GRAM POSITIVE RODS Performed at [facility], [address] [place], [place] [address]    Culture MULTIPLE ORGANISMS PRESENT, NONE PREDOMINANT (A)  Final   Report Status 05/14/2019 FINAL  Final      Radiology Studies: No results found.    Scheduled Meds: . baclofen  20 mg Oral BID  . Chlorhexidine Gluconate Cloth  6 each Topical Daily  . enoxaparin (LOVENOX) injection  40 mg Subcutaneous Q24H  . feeding supplement (PRO-STAT SUGAR FREE 64)  30 mL Oral BID  . ferrous sulfate  325 mg Oral Q breakfast  . fesoterodine  8 mg Oral Daily  . magnesium oxide  400 mg Oral Q breakfast  . metoCLOPramide  10 mg Oral TID WC  . midodrine  10 mg Oral TID WC  . multivitamin with minerals  1 tablet Oral Daily  . mupirocin ointment  1 application Nasal BID  . nutrition supplement (JUVEN)  1 packet Oral  BID BM  . pantoprazole  40 mg Oral BID  . potassium chloride  40 mEq Oral Q4H  . Ensure Max Protein  11 oz Oral Daily  . sodium chloride flush  10-40 mL Intracatheter Q12H  . sucralfate  1 g Oral QID  . vancomycin  125 mg Oral QID   Continuous Infusions: . sodium chloride 125 mL/hr at 05/14/19 1015     LOS: 3 days      Time spent: 40 minutes   Dessa Phi, DO [facility] 05/14/2019, 10:49 AM   Available via Epic secure chat 7am-7pm After these hours, please refer to coverage provider listed on [email or web address]
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
## 2019-05-14 NOTE — Progress Notes (Signed)
Midline has no blood return and Lab unable to get labs on pt. Phlebotomist Tried multiple times. Will try again on day shift.

## 2019-05-14 NOTE — Progress Notes (Signed)
CRITICAL VALUE ALERT  Critical Value:  2.6 Potassium   Date & Time Notied:  05/14/2019 at [place]  Provider Notified: Dessa Phi, MD  Orders Received/Actions taken: See orders for PO Potassium.

## 2019-05-14 NOTE — Plan of Care (Signed)

## 2019-05-15 LAB — BASIC METABOLIC PANEL
Anion gap: 7 (ref 5–15)
BUN: <5 mg/dL — ABNORMAL LOW (ref 6–20)
CO2: 24 mmol/L (ref 22–32)
Calcium: 7.8 mg/dL — ABNORMAL LOW (ref 8.9–10.3)
Chloride: 107 mmol/L (ref 98–111)
Creatinine, Ser: <0.3 mg/dL — ABNORMAL LOW (ref 0.61–1.24)
Glucose, Bld: 85 mg/dL (ref 70–99)
Potassium: 4.1 mmol/L (ref 3.5–5.1)
Sodium: 138 mmol/L (ref 135–145)

## 2019-05-15 LAB — CULTURE, BLOOD (ROUTINE X 2)
Culture: NO GROWTH
Special Requests: ADEQUATE

## 2019-05-15 LAB — MAGNESIUM: Magnesium: 1.3 mg/dL — ABNORMAL LOW (ref 1.7–2.4)

## 2019-05-15 MED ORDER — PROMETHAZINE HCL 25 MG/ML IJ SOLN
12.5000 mg | Freq: Four times a day (QID) | INTRAMUSCULAR | Status: DC | PRN
  Administered 2019-05-15 – 2019-05-19 (×4): 12.5 mg via INTRAVENOUS
  Filled 2019-05-15 (×4): qty 1

## 2019-05-15 MED ORDER — MAGNESIUM SULFATE 2 GM/50ML IV SOLN
2.0000 g | Freq: Once | INTRAVENOUS | Status: AC
  Administered 2019-05-15: 2 g via INTRAVENOUS
  Filled 2019-05-15: qty 50

## 2019-05-15 NOTE — Evaluation (Signed)
Physical Therapy Evaluation Patient Details Name: [name] [name] MRN: [id number] DOB: Sep 06, 1965 Today's Date: 05/15/2019   History of Present Illness  53 year old African-American male with history of quadriplegia, chronic decubitus ulcers, recurrent UTI, colostomy and chronic suprapubic catheter, chronic hypotension on midodrine admitted with chills, nausea, vomiting and diarrhea. Stool tested positive for C. difficile and on oral vancomycin. He was started on cefepime due to concern for UTI, which has since been discontinued.  Clinical Impression  Pt presents to PT at or near his functional baseline. Pt is totalA for mobility and ADLs at baseline, other than feeding which he requires minA for. Pt is contracted in BLE with no AROM noted, and has significant UE strength and ROM limitations due to C5 SCI. Pt's caretaker, his sister, recently contracted COVID-16 and is now unable to provide the level of care needed by the pt at this time. Pt will benefit from SNF placement to assist in bed mobility, wound care, transfers OOB, and ADL management until pt's caregiver is better able to physically assist this pt. Pt requires no further acute PT needs as he is at his baseline. Acute PT signing off 05/15/2019.    Follow Up Recommendations SNF;Supervision/Assistance - 24 hour    Equipment Recommendations  None recommended by PT(pt owns necessary DME)    Recommendations for Other Services       Precautions / Restrictions Precautions Precautions: Fall Restrictions Weight Bearing Restrictions: No Other Position/Activity Restrictions: quadriplegic      Mobility  Bed Mobility Overal bed mobility: Needs Assistance Bed Mobility: Rolling Rolling: Total assist            Transfers                    Ambulation/Gait                Stairs            Wheelchair Mobility    Modified Rankin (Stroke Patients Only)       Balance                                             Pertinent Vitals/Pain Pain Assessment: No/denies pain    Home Living Family/patient expects to be discharged to:: Private residence Living Arrangements: Other relatives;Other (Comment)(sister- now has covid and can't provide proper care) Available Help at Discharge: Family;Available 24 hours/day Type of Home: House Home Access: Ramped entrance     Home Layout: One level Home Equipment: Wheelchair - power;Hospital bed(hoyer lift)      Prior Function Level of Independence: Needs assistance   Gait / Transfers Assistance Needed: hoyer lift, total A to roll and transfer  ADL's / Homemaking Assistance Needed: dependent        Hand Dominance   Dominant Hand: Left    Extremity/Trunk Assessment   Upper Extremity Assessment Upper Extremity Assessment: RUE deficits/detail;LUE deficits/detail RUE Deficits / Details: 3/5 elbow flexion, 2/5 shoulder abduction and elbow extension, otherwise 0/5 RUE Sensation: decreased light touch LUE Deficits / Details: 3/5 shoulder abduction, elbow flexion and extension, otherwise 0/5 LUE Sensation: decreased light touch(no sensation on hand or dorsal surface of forearm)    Lower Extremity Assessment Lower Extremity Assessment: RLE deficits/detail;LLE deficits/detail(no sensation in BLE, hips rotated toward R side at baseline) RLE Deficits / Details: grossly contracted, 100 degree knee flexion contracture, 30 degree hip flexion contrature,  10 degree ankle PF contracture LLE Deficits / Details: grossly contracted, 100 degree knee flexion contracture, 30 degree hip flexion contrature, 10 degree ankle PF contracture    Cervical / Trunk Assessment Cervical / Trunk Assessment: Kyphotic;Other exceptions(hips rotated to R side chronically)  Communication   Communication: No difficulties  Cognition Arousal/Alertness: Awake/alert Behavior During Therapy: WFL for tasks assessed/performed Overall Cognitive Status: Within Functional  Limits for tasks assessed                                        General Comments General comments (skin integrity, edema, etc.): pt with multiple chronic wounds over body    Exercises     Assessment/Plan    PT Assessment Patent does not need any further PT services  PT Problem List         PT Treatment Interventions      PT Goals (Current goals can be found in the Care Plan section)       Frequency     Barriers to discharge        Co-evaluation               AM-PAC PT "6 Clicks" Mobility  Outcome Measure Help needed turning from your back to your side while in a flat bed without using bedrails?: Total Help needed moving from lying on your back to sitting on the side of a flat bed without using bedrails?: Total Help needed moving to and from a bed to a chair (including a wheelchair)?: Total Help needed standing up from a chair using your arms (e.g., wheelchair or bedside chair)?: Total Help needed to walk in hospital room?: Total Help needed climbing 3-5 steps with a railing? : Total 6 Click Score: 6    End of Session   Activity Tolerance: Patient tolerated treatment well Patient left: in bed;with call bell/phone within reach Nurse Communication: Mobility status PT Visit Diagnosis: Muscle weakness (generalized) (M62.81);Other symptoms and signs involving the nervous system (R29.898)    Time: [id number] PT Time Calculation (min) (ACUTE ONLY): 16 min   Charges:   PT Evaluation $PT Eval Moderate Complexity: 1 Mod          Zenaida Niece, PT, DPT Acute Rehabilitation Pager: [phone]   Zenaida Niece 05/15/2019, 3:13 PM

## 2019-05-15 NOTE — Progress Notes (Signed)
PROGRESS NOTE    [name] [name]  [id number] DOB: 06-Mar-1966 DOA: 05/10/2019 PCP: Andria Frames, PA-C     Brief Narrative:  NOMAN VANDAGRIFF is a 53 year old African-American male with history of quadriplegia, chronic decubitus ulcers, recurrent UTI, colostomy and chronic suprapubic catheter, chronic hypotension on midodrine admitted with chills, nausea, vomiting and diarrhea. Stool tested positive for C. difficile and on oral vancomycin. He was started on cefepime due to concern for UTI, which has since been discontinued.   New events last 24 hours / Subjective: He has no complaints.  Stool formed.  Afebrile.  Denies any abdominal pain.  Assessment & Plan:   Principal Problem:   Sepsis ([place]) Active Problems:   Quadriplegia ([place])   Pressure injury of skin   Acute lower UTI   Sepsis associated hypotension ([name]   Sepsis secondary to C. difficile -On oral vancomycin -Blood cultures negative -Stool is formed and soft in the colostomy bag  Bacteriuria -Urine culture revealed multiple organisms present -Stop cefepime  Quadriplegia with chronic colostomy, suprapubic catheter use -Continue home baclofen, Reglan  Chronic hypotension -Continue midodrine  Hypomagnesemia -Replace, trend   In agreement with assessment of the pressure ulcer as below:  Pressure Injury 03/06/19 Flank Right;Lower;Anterior Stage III -  Full thickness tissue loss. Subcutaneous fat may be visible but bone, tendon or muscle are NOT exposed. pink, yellow, slough (Active)  03/06/19 1115  Location: Flank  Location Orientation: Right;Lower;Anterior  Staging: Stage III -  Full thickness tissue loss. Subcutaneous fat may be visible but bone, tendon or muscle are NOT exposed.  Wound Description (Comments): pink, yellow, slough  Present on Admission: Yes     Pressure Injury 03/06/19 Shoulder Posterior;Right;Upper Stage III -  Full thickness tissue loss. Subcutaneous fat may be visible but bone, tendon or  muscle are NOT exposed. Pink, Red (Active)  03/06/19 1115  Location: Shoulder  Location Orientation: Posterior;Right;Upper  Staging: Stage III -  Full thickness tissue loss. Subcutaneous fat may be visible but bone, tendon or muscle are NOT exposed.  Wound Description (Comments): Pink, Red  Present on Admission: Yes     Pressure Injury 03/06/19 Hip Posterior;Right;Lateral Stage III -  Full thickness tissue loss. Subcutaneous fat may be visible but bone, tendon or muscle are NOT exposed. Yellow, tan, slough (Active)  03/06/19 1115  Location: Hip  Location Orientation: Posterior;Right;Lateral  Staging: Stage III -  Full thickness tissue loss. Subcutaneous fat may be visible but bone, tendon or muscle are NOT exposed.  Wound Description (Comments): Yellow, tan, slough  Present on Admission: Yes     Pressure Injury 03/06/19 Thigh Anterior;Right;Upper Stage IV - Full thickness tissue loss with exposed bone, tendon or muscle. Pink, red, slough (Active)  03/06/19 1115  Location: Thigh  Location Orientation: Anterior;Right;Upper  Staging: Stage IV - Full thickness tissue loss with exposed bone, tendon or muscle.  Wound Description (Comments): Pink, red, slough  Present on Admission: Yes     Pressure Injury 03/06/19 Buttocks Right;Left;Medial Stage IV - Full thickness tissue loss with exposed bone, tendon or muscle. Red (Active)  03/06/19 1115  Location: Buttocks  Location Orientation: Right;Left;Medial  Staging: Stage IV - Full thickness tissue loss with exposed bone, tendon or muscle.  Wound Description (Comments): Red  Present on Admission: Yes     Pressure Injury 03/06/19 Foot Anterior;Left;Lateral Stage II -  Partial thickness loss of dermis presenting as a shallow open ulcer with a red, pink wound bed without slough. Red (Active)  03/06/19 1115  Location: Foot  Location Orientation: Anterior;Left;Lateral  Staging: Stage II -  Partial thickness loss of dermis presenting as a shallow  open ulcer with a red, pink wound bed without slough.  Wound Description (Comments): Red  Present on Admission: Yes     Pressure Injury 03/27/19 Hip Anterior;Left Stage III -  Full thickness tissue loss. Subcutaneous fat may be visible but bone, tendon or muscle are NOT exposed. pink;yellow (Active)  03/27/19 1217  Location: Hip  Location Orientation: Anterior;Left  Staging: Stage III -  Full thickness tissue loss. Subcutaneous fat may be visible but bone, tendon or muscle are NOT exposed.  Wound Description (Comments): pink;yellow  Present on Admission: No     Pressure Injury 03/27/19 Rib Right;Posterior Stage III -  Full thickness tissue loss. Subcutaneous fat may be visible but bone, tendon or muscle are NOT exposed. pink;red;yellow (Active)  03/27/19 1219  Location: Rib  Location Orientation: Right;Posterior  Staging: Stage III -  Full thickness tissue loss. Subcutaneous fat may be visible but bone, tendon or muscle are NOT exposed.  Wound Description (Comments): pink;red;yellow  Present on Admission: No      DVT prophylaxis: Lovenox Code Status: Full Family Communication: None at bedside Disposition Plan: Unclear discharge planning.  Patient lives at home with sister and brother-in-law.  Sister is the main caregiver who helps with wound care at home.  Unfortunately, she was diagnosed with Covid last Wednesday, remains very weak per patient's report and is currently unable to care for patient.  Discussed with Education officer, museum.  PT OT ordered.   Consultants:   Palliative care    Antimicrobials:  Anti-infectives (From admission, onward)   Start     Dose/Rate Route Frequency Ordered Stop   05/12/19 2200  ceFEPIme (MAXIPIME) 2 g in sodium chloride 0.9 % 100 mL IVPB  Status:  Discontinued     2 g 200 mL/hr over 30 Minutes Intravenous Every 12 hours 05/12/19 1440 05/14/19 1014   05/11/19 1930  vancomycin (VANCOCIN) 50 mg/mL oral solution 125 mg     125 mg Oral 4 times daily  05/11/19 1916 05/21/19 1759   05/11/19 1000  ceFEPIme (MAXIPIME) 2 g in sodium chloride 0.9 % 100 mL IVPB  Status:  Discontinued     2 g 200 mL/hr over 30 Minutes Intravenous Every 8 hours 05/11/19 0245 05/12/19 1440   05/11/19 0245  ceFEPIme (MAXIPIME) 2 g in sodium chloride 0.9 % 100 mL IVPB     2 g 200 mL/hr over 30 Minutes Intravenous  Once 05/11/19 0232 05/11/19 0419   05/10/19 2330  meropenem (MERREM) 1 g in sodium chloride 0.9 % 100 mL IVPB     1 g 200 mL/hr over 30 Minutes Intravenous  Once 05/10/19 2319 05/11/19 0121   05/10/19 2330  linezolid (ZYVOX) IVPB 600 mg  Status:  Discontinued     600 mg 300 mL/hr over 60 Minutes Intravenous Every 12 hours 05/10/19 2319 05/10/19 2331   05/10/19 2330  linezolid (ZYVOX) IVPB 600 mg     600 mg 300 mL/hr over 60 Minutes Intravenous  Once 05/10/19 2322 05/11/19 0226       Objective: Vitals:   05/14/19 2100 05/14/19 2340 05/15/19 0408 05/15/19 0800  BP:  (!) 147/79 127/82 116/82  Pulse:  96 94 98  Resp:  19 (!) 21 20  Temp: 99.5 F (37.5 C) 99.5 F (37.5 C)  98.9 F (37.2 C)  TempSrc: Oral Oral Oral Oral  SpO2:  95% 96% 97%  Weight:      Height:        Intake/Output Summary (Last 24 hours) at 05/15/2019 0934 Last data filed at 05/15/2019 0848 Gross per 24 hour  Intake 1176.98 ml  Output 3000 ml  Net -1823.02 ml   Filed Weights   05/11/19 0506 05/11/19 0649  Weight: 83 kg 82.2 kg    Examination: General exam: Appears calm and comfortable  Respiratory system: Clear to auscultation. Respiratory effort normal. Cardiovascular system: S1 & S2 heard, RRR. No pedal edema. Gastrointestinal system: Abdomen is nondistended, soft and nontender. Normal bowel sounds heard.  Colostomy bag with formed stool Central nervous system: Alert and oriented Extremities: Contractures of extremities present Psychiatry: Judgement and insight appear stable. Mood & affect appropriate.   Data Reviewed: I have personally reviewed following labs  and imaging studies  CBC: Recent Labs  Lab 05/10/19 2230 05/11/19 0527 05/13/19 1502 05/14/19 0753  WBC 5.8 8.2 5.9 6.8  NEUTROABS 4.4  --   --   --   HGB 9.6* 7.6* 7.7* 7.8*  HCT 31.9* 25.4* 25.2* 25.1*  MCV 87.4 89.4 85.4 84.8  PLT 400 345 363 [id number]   Basic Metabolic Panel: Recent Labs  Lab 05/10/19 2230 05/11/19 0527 05/13/19 1328 05/14/19 0753 05/15/19 0819  NA 139 143 140 140 138  K 4.3 3.8 4.2 2.6* 4.1  CL 107 111 110 107 107  CO2 23 23 20* 24 24  GLUCOSE 116* 108* 101* 85 85  BUN 11 13 7  <5* <5*  CREATININE 0.41* 0.36* <0.30* <0.30* <0.30*  CALCIUM 8.4* 7.1* 7.6* 7.7* 7.8*  MG  --   --   --   --  1.3*   GFR: CrCl cannot be calculated (This lab value cannot be used to calculate CrCl because it is not a number: <0.30). Liver Function Tests: Recent Labs  Lab 05/10/19 2230  AST 24  ALT 17  ALKPHOS 81  BILITOT 0.3  PROT 6.1*  ALBUMIN 2.0*   No results for input(s): LIPASE, AMYLASE in the last 168 hours. No results for input(s): AMMONIA in the last 168 hours. Coagulation Profile: Recent Labs  Lab 05/10/19 2230  INR 1.0   Cardiac Enzymes: No results for input(s): CKTOTAL, CKMB, CKMBINDEX, TROPONINI in the last 168 hours. BNP (last 3 results) No results for input(s): PROBNP in the last 8760 hours. HbA1C: No results for input(s): HGBA1C in the last 72 hours. CBG: No results for input(s): GLUCAP in the last 168 hours. Lipid Profile: No results for input(s): CHOL, HDL, LDLCALC, TRIG, CHOLHDL, LDLDIRECT in the last 72 hours. Thyroid Function Tests: No results for input(s): TSH, T4TOTAL, FREET4, T3FREE, THYROIDAB in the last 72 hours. Anemia Panel: No results for input(s): VITAMINB12, FOLATE, FERRITIN, TIBC, IRON, RETICCTPCT in the last 72 hours. Sepsis Labs: Recent Labs  Lab 05/10/19 2230 05/11/19 1304  LATICACIDVEN 1.7 1.2    Recent Results (from the past 240 hour(s))  Blood Culture (routine x 2)     Status: None (Preliminary result)    Collection Time: 05/10/19 10:35 PM   Specimen: BLOOD  Result Value Ref Range Status   Specimen Description BLOOD LEFT ANTECUBITAL  Final   Special Requests   Final    BOTTLES DRAWN AEROBIC AND ANAEROBIC Blood Culture adequate volume   Culture   Final    NO GROWTH 4 DAYS Performed at [facility], [address], [place], [place] [address]    Report Status PENDING  Incomplete  Respiratory Panel by RT PCR (  Flu A&B, Covid) - Nasopharyngeal Swab     Status: None   Collection Time: 05/11/19  1:30 AM   Specimen: Nasopharyngeal Swab  Result Value Ref Range Status   SARS Coronavirus 2 by RT PCR NEGATIVE NEGATIVE Final    Comment: (NOTE) SARS-CoV-2 target nucleic acids are NOT DETECTED. The SARS-CoV-2 RNA is generally detectable in upper respiratoy specimens during the acute phase of infection. The lowest concentration of SARS-CoV-2 viral copies this assay can detect is 131 copies/mL. A negative result does not preclude SARS-Cov-2 infection and should not be used as the sole basis for treatment or other patient management decisions. A negative result may occur with  improper specimen collection/handling, submission of specimen other than nasopharyngeal swab, presence of viral mutation(s) within the areas targeted by this assay, and inadequate number of viral copies (<131 copies/mL). A negative result must be combined with clinical observations, patient history, and epidemiological information. The expected result is Negative. Fact Sheet for Patients:  PinkCheek.be Fact Sheet for Healthcare Providers:  GravelBags.it This test is not yet ap proved or cleared by the [place] FDA and  has been authorized for detection and/or diagnosis of SARS-CoV-2 by FDA under an Emergency Use Authorization (EUA). This EUA will remain  in effect (meaning this test can be used) for the duration of the COVID-19 declaration under Section  564(b)(1) of the Act, 21 U.S.C. section 360bbb-3(b)(1), unless the authorization is terminated or revoked sooner.    Influenza A by PCR NEGATIVE NEGATIVE Final   Influenza B by PCR NEGATIVE NEGATIVE Final    Comment: (NOTE) The Xpert Xpress SARS-CoV-2/FLU/RSV assay is intended as an aid in  the diagnosis of influenza from Nasopharyngeal swab specimens and  should not be used as a sole basis for treatment. Nasal washings and  aspirates are unacceptable for Xpert Xpress SARS-CoV-2/FLU/RSV  testing. Fact Sheet for Patients: PinkCheek.be Fact Sheet for Healthcare Providers: GravelBags.it This test is not yet approved or cleared by the [place] FDA and  has been authorized for detection and/or diagnosis of SARS-CoV-2 by  FDA under an Emergency Use Authorization (EUA). This EUA will remain  in effect (meaning this test can be used) for the duration of the  Covid-19 declaration under Section 564(b)(1) of the Act, 21  U.S.C. section 360bbb-3(b)(1), unless the authorization is  terminated or revoked. Performed at [facility], [place] [address] [place], [place] [address]   MRSA PCR Screening     Status: Abnormal   Collection Time: 05/11/19  8:48 AM   Specimen: Nasal Mucosa; Nasopharyngeal  Result Value Ref Range Status   MRSA by PCR POSITIVE (A) NEGATIVE Final    Comment:        The GeneXpert MRSA Assay (FDA approved for NASAL specimens only), is one component of a comprehensive MRSA colonization surveillance program. It is not intended to diagnose MRSA infection nor to guide or monitor treatment for MRSA infections. RESULT CALLED TO, READ BACK BY AND VERIFIED WITH: D.MUHORO RN AT 1134 05/11/2019 BY A.DAVIS Performed at [facility], [address] [place], [place] [address]   Urine culture     Status: Abnormal   Collection Time: 05/11/19 10:11 AM   Specimen: In/Out Cath Urine  Result Value Ref Range  Status   Specimen Description IN/OUT CATH URINE  Final   Special Requests   Final    NONE Performed at [facility], [place] [address], [place], [place] [address]    Culture MULTIPLE SPECIES  PRESENT, SUGGEST RECOLLECTION (A)  Final   Report Status 05/14/2019 FINAL  Final  C difficile quick scan w PCR reflex     Status: Abnormal   Collection Time: 05/11/19  2:14 PM   Specimen: STOOL  Result Value Ref Range Status   C Diff antigen POSITIVE (A) NEGATIVE Final   C Diff toxin NEGATIVE NEGATIVE Final   C Diff interpretation Results are indeterminate. See PCR results.  Final    Comment: Performed at [facility], [place] [address], [place], [place] [address]  C. Diff by PCR, Reflexed     Status: Abnormal   Collection Time: 05/11/19  3:57 PM  Result Value Ref Range Status   Toxigenic C. Difficile by PCR POSITIVE (A) NEGATIVE Final    Comment: Positive for toxigenic C. difficile with little to no toxin production. Only treat if clinical presentation suggests symptomatic illness. Performed at [facility], [place] [address], [place], [place] [address]   Aerobic Culture (superficial specimen)     Status: Abnormal   Collection Time: 05/12/19  1:22 PM   Specimen: Wound  Result Value Ref Range Status   Specimen Description WOUND  Final   Special Requests NONE  Final   Gram Stain   Final    NO WBC SEEN RARE GRAM POSITIVE COCCI IN PAIRS FEW GRAM POSITIVE RODS Performed at [facility], [address], [place], [place] [address]    Culture MULTIPLE ORGANISMS PRESENT, NONE PREDOMINANT (A)  Final   Report Status 05/14/2019 FINAL  Final      Radiology Studies: No results found.    Scheduled Meds: . baclofen  20 mg Oral BID  . Chlorhexidine Gluconate Cloth  6 each Topical Daily  . enoxaparin (LOVENOX) injection  40 mg Subcutaneous Q24H  . feeding supplement (PRO-STAT SUGAR FREE 64)  30 mL Oral BID  . ferrous sulfate  325 mg Oral Q breakfast  . fesoterodine  8 mg Oral  Daily  . metoCLOPramide  10 mg Oral TID WC  . midodrine  10 mg Oral TID WC  . multivitamin with minerals  1 tablet Oral Daily  . mupirocin ointment  1 application Nasal BID  . nutrition supplement (JUVEN)  1 packet Oral BID BM  . pantoprazole  40 mg Oral BID  . Ensure Max Protein  11 oz Oral Daily  . sodium chloride flush  10-40 mL Intracatheter Q12H  . sucralfate  1 g Oral QID  . vancomycin  125 mg Oral QID   Continuous Infusions: . magnesium sulfate bolus IVPB       LOS: 4 days      Time spent: 25 minutes   Dessa Phi, DO [facility] 05/15/2019, 9:34 AM   Available via Epic secure chat 7am-7pm After these hours, please refer to coverage provider listed on [email or web address]

## 2019-05-15 NOTE — Progress Notes (Signed)
Midline did not draw back blood for morning labs. Pt was stuck by lab and could not get any blood. Lab is going to have someone else come and draw blood.

## 2019-05-16 ENCOUNTER — Inpatient Hospital Stay (HOSPITAL_COMMUNITY): Payer: Medicare Other

## 2019-05-16 DIAGNOSIS — D638 Anemia in other chronic diseases classified elsewhere: Secondary | ICD-10-CM

## 2019-05-16 DIAGNOSIS — A0472 Enterocolitis due to Clostridium difficile, not specified as recurrent: Secondary | ICD-10-CM

## 2019-05-16 DIAGNOSIS — A419 Sepsis, unspecified organism: Secondary | ICD-10-CM

## 2019-05-16 LAB — BASIC METABOLIC PANEL
Anion gap: 10 (ref 5–15)
BUN: 5 mg/dL — ABNORMAL LOW (ref 6–20)
CO2: 23 mmol/L (ref 22–32)
Calcium: 8.1 mg/dL — ABNORMAL LOW (ref 8.9–10.3)
Chloride: 105 mmol/L (ref 98–111)
Creatinine, Ser: 0.52 mg/dL — ABNORMAL LOW (ref 0.61–1.24)
GFR calc Af Amer: >60 mL/min (ref >60)
GFR calc non Af Amer: >60 mL/min (ref >60)
Glucose, Bld: 98 mg/dL (ref 70–99)
Potassium: 3.5 mmol/L (ref 3.5–5.1)
Sodium: 138 mmol/L (ref 135–145)

## 2019-05-16 LAB — SARS CORONAVIRUS 2 (TAT 6-24 HRS): SARS Coronavirus 2: NEGATIVE

## 2019-05-16 LAB — MAGNESIUM: Magnesium: 1.6 mg/dL — ABNORMAL LOW (ref 1.7–2.4)

## 2019-05-16 MED ORDER — MAGNESIUM SULFATE 2 GM/50ML IV SOLN
2.0000 g | Freq: Once | INTRAVENOUS | Status: AC
  Administered 2019-05-16: 2 g via INTRAVENOUS
  Filled 2019-05-16: qty 50

## 2019-05-16 NOTE — Progress Notes (Signed)
PROGRESS NOTE    [name] [name]  [id number] DOB: 1966/01/12 DOA: 05/10/2019 PCP: Andria Frames, PA-C     Brief Narrative:  [name] [name] is a 53 year old African-American male with history of quadriplegia, chronic decubitus ulcers, recurrent UTI, colostomy and chronic suprapubic catheter, chronic hypotension on midodrine admitted with chills, nausea, vomiting and diarrhea. Stool tested positive for C. difficile and on oral vancomycin. He was started on cefepime due to concern for UTI, which has since been discontinued due to no organism shown on urine culture.   New events last 24 hours / Subjective: Had large volume colostomy output this morning.  States that he feels sick but denies any nausea, vomiting or abdominal pain.  He did have a fever 100.4 overnight.  Assessment & Plan:   Principal Problem:   Sepsis ([place]) Active Problems:   Quadriplegia ([place])   Pressure injury of skin   Acute lower UTI   Sepsis associated hypotension ([name]   Sepsis secondary to C. difficile -On oral vancomycin -Blood cultures negative -Fever overnight 100.4 with large volume colostomy output. Check abdominal x-ray  Bacteriuria -Urine culture revealed multiple organisms present -Stop cefepime  Quadriplegia with chronic colostomy, suprapubic catheter use -Continue home baclofen, Reglan  Chronic hypotension -Continue midodrine  Hypomagnesemia -Replace, trend   In agreement with assessment of the pressure ulcer as below:  Pressure Injury 03/06/19 Flank Right;Lower;Anterior Stage III -  Full thickness tissue loss. Subcutaneous fat may be visible but bone, tendon or muscle are NOT exposed. pink, yellow, slough (Active)  03/06/19 1115  Location: Flank  Location Orientation: Right;Lower;Anterior  Staging: Stage III -  Full thickness tissue loss. Subcutaneous fat may be visible but bone, tendon or muscle are NOT exposed.  Wound Description (Comments): pink, yellow, slough  Present on  Admission: Yes     Pressure Injury 03/06/19 Shoulder Posterior;Right;Upper Stage III -  Full thickness tissue loss. Subcutaneous fat may be visible but bone, tendon or muscle are NOT exposed. Pink, Red (Active)  03/06/19 1115  Location: Shoulder  Location Orientation: Posterior;Right;Upper  Staging: Stage III -  Full thickness tissue loss. Subcutaneous fat may be visible but bone, tendon or muscle are NOT exposed.  Wound Description (Comments): Pink, Red  Present on Admission: Yes     Pressure Injury 03/06/19 Hip Posterior;Right;Lateral Stage III -  Full thickness tissue loss. Subcutaneous fat may be visible but bone, tendon or muscle are NOT exposed. Yellow, tan, slough (Active)  03/06/19 1115  Location: Hip  Location Orientation: Posterior;Right;Lateral  Staging: Stage III -  Full thickness tissue loss. Subcutaneous fat may be visible but bone, tendon or muscle are NOT exposed.  Wound Description (Comments): Yellow, tan, slough  Present on Admission: Yes     Pressure Injury 03/06/19 Thigh Anterior;Right;Upper Stage IV - Full thickness tissue loss with exposed bone, tendon or muscle. Pink, red, slough (Active)  03/06/19 1115  Location: Thigh  Location Orientation: Anterior;Right;Upper  Staging: Stage IV - Full thickness tissue loss with exposed bone, tendon or muscle.  Wound Description (Comments): Pink, red, slough  Present on Admission: Yes     Pressure Injury 03/06/19 Buttocks Right;Left;Medial Stage IV - Full thickness tissue loss with exposed bone, tendon or muscle. Red (Active)  03/06/19 1115  Location: Buttocks  Location Orientation: Right;Left;Medial  Staging: Stage IV - Full thickness tissue loss with exposed bone, tendon or muscle.  Wound Description (Comments): Red  Present on Admission: Yes     Pressure Injury 03/06/19 Foot Anterior;Left;Lateral Stage II -  Partial thickness loss of dermis presenting as a shallow open ulcer with a red, pink wound bed without slough. Red  (Active)  03/06/19 1115  Location: Foot  Location Orientation: Anterior;Left;Lateral  Staging: Stage II -  Partial thickness loss of dermis presenting as a shallow open ulcer with a red, pink wound bed without slough.  Wound Description (Comments): Red  Present on Admission: Yes     Pressure Injury 03/27/19 Hip Anterior;Left Stage III -  Full thickness tissue loss. Subcutaneous fat may be visible but bone, tendon or muscle are NOT exposed. pink;yellow (Active)  03/27/19 1217  Location: Hip  Location Orientation: Anterior;Left  Staging: Stage III -  Full thickness tissue loss. Subcutaneous fat may be visible but bone, tendon or muscle are NOT exposed.  Wound Description (Comments): pink;yellow  Present on Admission: No     Pressure Injury 03/27/19 Rib Right;Posterior Stage III -  Full thickness tissue loss. Subcutaneous fat may be visible but bone, tendon or muscle are NOT exposed. pink;red;yellow (Active)  03/27/19 1219  Location: Rib  Location Orientation: Right;Posterior  Staging: Stage III -  Full thickness tissue loss. Subcutaneous fat may be visible but bone, tendon or muscle are NOT exposed.  Wound Description (Comments): pink;red;yellow  Present on Admission: No      DVT prophylaxis: Lovenox Code Status: Full Family Communication: None at bedside Disposition Plan: Unclear discharge planning.  Patient lives at home with sister and brother-in-law.  Sister is the main caregiver who helps with wound care at home.  Unfortunately, she was diagnosed with Covid last Wednesday, remains very weak per patient's report and is currently unable to care for patient.  Discussed with Education officer, museum.  May need SNF for discharge.    Consultants:   Palliative care    Antimicrobials:  Anti-infectives (From admission, onward)   Start     Dose/Rate Route Frequency Ordered Stop   05/12/19 2200  ceFEPIme (MAXIPIME) 2 g in sodium chloride 0.9 % 100 mL IVPB  Status:  Discontinued     2 g 200  mL/hr over 30 Minutes Intravenous Every 12 hours 05/12/19 1440 05/14/19 1014   05/11/19 1930  vancomycin (VANCOCIN) 50 mg/mL oral solution 125 mg     125 mg Oral 4 times daily 05/11/19 1916 05/21/19 1759   05/11/19 1000  ceFEPIme (MAXIPIME) 2 g in sodium chloride 0.9 % 100 mL IVPB  Status:  Discontinued     2 g 200 mL/hr over 30 Minutes Intravenous Every 8 hours 05/11/19 0245 05/12/19 1440   05/11/19 0245  ceFEPIme (MAXIPIME) 2 g in sodium chloride 0.9 % 100 mL IVPB     2 g 200 mL/hr over 30 Minutes Intravenous  Once 05/11/19 0232 05/11/19 0419   05/10/19 2330  meropenem (MERREM) 1 g in sodium chloride 0.9 % 100 mL IVPB     1 g 200 mL/hr over 30 Minutes Intravenous  Once 05/10/19 2319 05/11/19 0121   05/10/19 2330  linezolid (ZYVOX) IVPB 600 mg  Status:  Discontinued     600 mg 300 mL/hr over 60 Minutes Intravenous Every 12 hours 05/10/19 2319 05/10/19 2331   05/10/19 2330  linezolid (ZYVOX) IVPB 600 mg     600 mg 300 mL/hr over 60 Minutes Intravenous  Once 05/10/19 2322 05/11/19 0226       Objective: Vitals:   05/15/19 1957 05/15/19 2307 05/16/19 0134 05/16/19 0750  BP: 107/76 136/86  (!) 134/98  Pulse: 95 (!) 105  98  Resp:  Temp: 99.6 F (37.6 C) (!) 100.4 F (38 C) 99.4 F (37.4 C) 98.4 F (36.9 C)  TempSrc: Oral Oral Oral Oral  SpO2: 95% 100%  100%  Weight:      Height:        Intake/Output Summary (Last 24 hours) at 05/16/2019 0853 Last data filed at 05/16/2019 0523 Gross per 24 hour  Intake 600 ml  Output 1551 ml  Net -951 ml   Filed Weights   05/11/19 0506 05/11/19 0649  Weight: 83 kg 82.2 kg    Examination: General exam: Appears calm and comfortable  Respiratory system: Clear to auscultation. Respiratory effort normal. Cardiovascular system: S1 & S2 heard, RRR. No pedal edema. Gastrointestinal system: Abdomen is nondistended, soft and nontender. +Colostomy bag has been emptied and currently the stump is being cleaned Central nervous system: Alert  and oriented. Non focal exam. Speech clear  Extremities: Contractures of extremities Psychiatry: Judgement and insight appear stable. Mood & affect appropriate.    Data Reviewed: I have personally reviewed following labs and imaging studies  CBC: Recent Labs  Lab 05/10/19 2230 05/11/19 0527 05/13/19 1502 05/14/19 0753  WBC 5.8 8.2 5.9 6.8  NEUTROABS 4.4  --   --   --   HGB 9.6* 7.6* 7.7* 7.8*  HCT 31.9* 25.4* 25.2* 25.1*  MCV 87.4 89.4 85.4 84.8  PLT 400 345 363 [id number]   Basic Metabolic Panel: Recent Labs  Lab 05/11/19 0527 05/13/19 1328 05/14/19 0753 05/15/19 0819 05/16/19 0608  NA 143 140 140 138 138  K 3.8 4.2 2.6* 4.1 3.5  CL 111 110 107 107 105  CO2 23 20* 24 24 23   GLUCOSE 108* 101* 85 85 98  BUN 13 7 <5* <5* 5*  CREATININE 0.36* <0.30* <0.30* <0.30* 0.52*  CALCIUM 7.1* 7.6* 7.7* 7.8* 8.1*  MG  --   --   --  1.3* 1.6*   GFR: Estimated Creatinine Clearance: 118.6 mL/min (A) (by C-G formula based on SCr of 0.52 mg/dL (L)). Liver Function Tests: Recent Labs  Lab 05/10/19 2230  AST 24  ALT 17  ALKPHOS 81  BILITOT 0.3  PROT 6.1*  ALBUMIN 2.0*   No results for input(s): LIPASE, AMYLASE in the last 168 hours. No results for input(s): AMMONIA in the last 168 hours. Coagulation Profile: Recent Labs  Lab 05/10/19 2230  INR 1.0   Cardiac Enzymes: No results for input(s): CKTOTAL, CKMB, CKMBINDEX, TROPONINI in the last 168 hours. BNP (last 3 results) No results for input(s): PROBNP in the last 8760 hours. HbA1C: No results for input(s): HGBA1C in the last 72 hours. CBG: No results for input(s): GLUCAP in the last 168 hours. Lipid Profile: No results for input(s): CHOL, HDL, LDLCALC, TRIG, CHOLHDL, LDLDIRECT in the last 72 hours. Thyroid Function Tests: No results for input(s): TSH, T4TOTAL, FREET4, T3FREE, THYROIDAB in the last 72 hours. Anemia Panel: No results for input(s): VITAMINB12, FOLATE, FERRITIN, TIBC, IRON, RETICCTPCT in the last 72  hours. Sepsis Labs: Recent Labs  Lab 05/10/19 2230 05/11/19 1304  LATICACIDVEN 1.7 1.2    Recent Results (from the past 240 hour(s))  Blood Culture (routine x 2)     Status: None   Collection Time: 05/10/19 10:35 PM   Specimen: BLOOD  Result Value Ref Range Status   Specimen Description BLOOD LEFT ANTECUBITAL  Final   Special Requests   Final    BOTTLES DRAWN AEROBIC AND ANAEROBIC Blood Culture adequate volume   Culture   Final  NO GROWTH 5 DAYS Performed at [facility], [place] [address] [place], [place] [address]    Report Status 05/15/2019 FINAL  Final  Respiratory Panel by RT PCR (Flu A&B, Covid) - Nasopharyngeal Swab     Status: None   Collection Time: 05/11/19  1:30 AM   Specimen: Nasopharyngeal Swab  Result Value Ref Range Status   SARS Coronavirus 2 by RT PCR NEGATIVE NEGATIVE Final    Comment: (NOTE) SARS-CoV-2 target nucleic acids are NOT DETECTED. The SARS-CoV-2 RNA is generally detectable in upper respiratoy specimens during the acute phase of infection. The lowest concentration of SARS-CoV-2 viral copies this assay can detect is 131 copies/mL. A negative result does not preclude SARS-Cov-2 infection and should not be used as the sole basis for treatment or other patient management decisions. A negative result may occur with  improper specimen collection/handling, submission of specimen other than nasopharyngeal swab, presence of viral mutation(s) within the areas targeted by this assay, and inadequate number of viral copies (<131 copies/mL). A negative result must be combined with clinical observations, patient history, and epidemiological information. The expected result is Negative. Fact Sheet for Patients:  PinkCheek.be Fact Sheet for Healthcare Providers:  GravelBags.it This test is not yet ap proved or cleared by the [place] FDA and  has been authorized for detection and/or diagnosis  of SARS-CoV-2 by FDA under an Emergency Use Authorization (EUA). This EUA will remain  in effect (meaning this test can be used) for the duration of the COVID-19 declaration under Section 564(b)(1) of the Act, 21 U.S.C. section 360bbb-3(b)(1), unless the authorization is terminated or revoked sooner.    Influenza A by PCR NEGATIVE NEGATIVE Final   Influenza B by PCR NEGATIVE NEGATIVE Final    Comment: (NOTE) The Xpert Xpress SARS-CoV-2/FLU/RSV assay is intended as an aid in  the diagnosis of influenza from Nasopharyngeal swab specimens and  should not be used as a sole basis for treatment. Nasal washings and  aspirates are unacceptable for Xpert Xpress SARS-CoV-2/FLU/RSV  testing. Fact Sheet for Patients: PinkCheek.be Fact Sheet for Healthcare Providers: GravelBags.it This test is not yet approved or cleared by the [place] FDA and  has been authorized for detection and/or diagnosis of SARS-CoV-2 by  FDA under an Emergency Use Authorization (EUA). This EUA will remain  in effect (meaning this test can be used) for the duration of the  Covid-19 declaration under Section 564(b)(1) of the Act, 21  U.S.C. section 360bbb-3(b)(1), unless the authorization is  terminated or revoked. Performed at [facility], [place] [address] [place], [place] [address]   MRSA PCR Screening     Status: Abnormal   Collection Time: 05/11/19  8:48 AM   Specimen: Nasal Mucosa; Nasopharyngeal  Result Value Ref Range Status   MRSA by PCR POSITIVE (A) NEGATIVE Final    Comment:        The GeneXpert MRSA Assay (FDA approved for NASAL specimens only), is one component of a comprehensive MRSA colonization surveillance program. It is not intended to diagnose MRSA infection nor to guide or monitor treatment for MRSA infections. RESULT CALLED TO, READ BACK BY AND VERIFIED WITH: D.MUHORO RN AT 1134 05/11/2019 BY A.DAVIS Performed at [facility], [address], [place], [place] [address]   Urine culture     Status: Abnormal   Collection Time: 05/11/19 10:11 AM   Specimen: In/Out Cath Urine  Result Value Ref Range Status   Specimen Description IN/OUT CATH URINE  Final   Special Requests   Final    NONE Performed at [name], [place] [address] [place], [place] [address]    Culture MULTIPLE SPECIES PRESENT, SUGGEST RECOLLECTION (A)  Final   Report Status 05/14/2019 FINAL  Final  C difficile quick scan w PCR reflex     Status: Abnormal   Collection Time: 05/11/19  2:14 PM   Specimen: STOOL  Result Value Ref Range Status   C Diff antigen POSITIVE (A) NEGATIVE Final   C Diff toxin NEGATIVE NEGATIVE Final   C Diff interpretation Results are indeterminate. See PCR results.  Final    Comment: Performed at [facility], [place] [address] [place], [place] [address]  C. Diff by PCR, Reflexed     Status: Abnormal   Collection Time: 05/11/19  3:57 PM  Result Value Ref Range Status   Toxigenic C. Difficile by PCR POSITIVE (A) NEGATIVE Final    Comment: Positive for toxigenic C. difficile with little to no toxin production. Only treat if clinical presentation suggests symptomatic illness. Performed at [facility], [place] [address], [place], [place] [address]   Aerobic Culture (superficial specimen)     Status: Abnormal   Collection Time: 05/12/19  1:22 PM   Specimen: Wound  Result Value Ref Range Status   Specimen Description WOUND  Final   Special Requests NONE  Final   Gram Stain   Final    NO WBC SEEN RARE GRAM POSITIVE COCCI IN PAIRS FEW GRAM POSITIVE RODS Performed at [facility], [address] [place], [place] [address]    Culture MULTIPLE ORGANISMS PRESENT, NONE PREDOMINANT (A)  Final   Report Status 05/14/2019 FINAL  Final      Radiology Studies: No results found.    Scheduled Meds: . baclofen  20 mg Oral BID  . Chlorhexidine Gluconate Cloth  6 each Topical Daily  . enoxaparin  (LOVENOX) injection  40 mg Subcutaneous Q24H  . feeding supplement (PRO-STAT SUGAR FREE 64)  30 mL Oral BID  . ferrous sulfate  325 mg Oral Q breakfast  . fesoterodine  8 mg Oral Daily  . metoCLOPramide  10 mg Oral TID WC  . midodrine  10 mg Oral TID WC  . multivitamin with minerals  1 tablet Oral Daily  . nutrition supplement (JUVEN)  1 packet Oral BID BM  . pantoprazole  40 mg Oral BID  . Ensure Max Protein  11 oz Oral Daily  . sodium chloride flush  10-40 mL Intracatheter Q12H  . sucralfate  1 g Oral QID  . vancomycin  125 mg Oral QID   Continuous Infusions: . magnesium sulfate bolus IVPB 2 g (05/16/19 0824)     LOS: 5 days      Time spent: 25 minutes   Dessa Phi, DO [facility] 05/16/2019, 8:53 AM   Available via Epic secure chat 7am-7pm After these hours, please refer to coverage provider listed on [email or web address]

## 2019-05-16 NOTE — Plan of Care (Signed)

## 2019-05-16 NOTE — Evaluation (Signed)
Occupational Therapy Evaluation Patient Details Name: [name] [name] MRN: [id number] DOB: 01/16/1966 Today's Date: 05/16/2019    History of Present Illness 53 year old African-American male with history of quadriplegia, chronic decubitus ulcers, recurrent UTI, colostomy and chronic suprapubic catheter, chronic hypotension on midodrine admitted with chills, nausea, vomiting and diarrhea. Stool tested positive for C. difficile and on oral vancomycin. He was started on cefepime due to concern for UTI, which has since been discontinued.   Clinical Impression   Pt is functioning at his baseline in mobility and ADL. Encouraged pt to have family bring his universal cuff he uses for self feeding to the hospital as this is not something we have access to. No further OT needs.    Follow Up Recommendations  SNF;Supervision/Assistance - 24 hour    Equipment Recommendations       Recommendations for Other Services       Precautions / Restrictions Precautions Precautions: Fall Restrictions Weight Bearing Restrictions: No Other Position/Activity Restrictions: quadriplegic      Mobility Bed Mobility               General bed mobility comments: total assist  Transfers                 General transfer comment: requires lift equipment    Balance                                           ADL either performed or assessed with clinical judgement   ADL Overall ADL's : At baseline                                             Vision Baseline Vision/History: Wears glasses Wears Glasses: At all times Patient Visual Report: No change from baseline       Perception     Praxis      Pertinent Vitals/Pain Pain Assessment: No/denies pain     Hand Dominance Left   Extremity/Trunk Assessment Upper Extremity Assessment RUE Deficits / Details: 3/5 elbow flexion, 2/5 shoulder abduction and elbow extension, otherwise 0/5 LUE Deficits /  Details: 3/5 shoulder abduction, elbow flexion and extension, otherwise 0/5           Communication Communication Communication: No difficulties   Cognition Arousal/Alertness: Awake/alert Behavior During Therapy: WFL for tasks assessed/performed Overall Cognitive Status: Within Functional Limits for tasks assessed                                     General Comments       Exercises     Shoulder Instructions      Home Living Family/patient expects to be discharged to:: Private residence Living Arrangements: Other relatives(sister) Available Help at Discharge: Family;Available 24 hours/day Type of Home: House Home Access: Ramped entrance     Home Layout: One level     Bathroom Shower/Tub: Teacher, early years/pre: Standard     Home Equipment: Wheelchair - power;Hospital bed(hoyer lift, universal cuffs x 2)          Prior Functioning/Environment Level of Independence: Needs assistance  Gait / Transfers Assistance Needed: hoyer lift, total A to roll and transfer ADL's / Homemaking  Assistance Needed: self feeds and can use his cell phoe with a univeral cuff, otherwise dependent            OT Problem List:        OT Treatment/Interventions:      OT Goals(Current goals can be found in the care plan section)    OT Frequency:     Barriers to D/C:            Co-evaluation              AM-PAC OT "6 Clicks" Daily Activity     Outcome Measure Help from another person eating meals?: A Lot Help from another person taking care of personal grooming?: Total Help from another person toileting, which includes using toliet, bedpan, or urinal?: Total Help from another person bathing (including washing, rinsing, drying)?: Total Help from another person to put on and taking off regular upper body clothing?: Total Help from another person to put on and taking off regular lower body clothing?: Total 6 Click Score: 7   End of Session     Activity Tolerance: Patient tolerated treatment well Patient left: in bed;with call bell/phone within reach(can use call button with stylus)  OT Visit Diagnosis: Muscle weakness (generalized) (M62.81)                Time: [id number] OT Time Calculation (min): 18 min Charges:  OT General Charges $OT Visit: 1 Visit OT Evaluation $OT Eval Moderate Complexity: 1 Mod  Nestor Lewandowsky, OTR/L Acute Rehabilitation Services Pager: [phone] Office: [phone]  Malka So 05/16/2019, 11:22 AM

## 2019-05-16 NOTE — Progress Notes (Signed)
Hydrologist [facility])  Hospital Liaison: RN note       Notified by Pricilla Riffle, CSW of patient/family request for [facility] Palliative services at home after discharge.             [facility] Palliative team will follow up with patient and SNF once the facility has been determined.      Please call with any hospice or palliative related questions.       Thank you for this referral.       Farrel Gordon, RN, CCM   [place] (listed on AMION under [facility])   [phone]
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
## 2019-05-17 ENCOUNTER — Encounter (HOSPITAL_COMMUNITY): Payer: Self-pay | Admitting: Internal Medicine

## 2019-05-17 DIAGNOSIS — A0472 Enterocolitis due to Clostridium difficile, not specified as recurrent: Secondary | ICD-10-CM

## 2019-05-17 HISTORY — DX: Enterocolitis due to Clostridium difficile, not specified as recurrent: A04.72

## 2019-05-17 LAB — MAGNESIUM: Magnesium: 1.6 mg/dL — ABNORMAL LOW (ref 1.7–2.4)

## 2019-05-17 LAB — BASIC METABOLIC PANEL
Anion gap: 9 (ref 5–15)
BUN: <5 mg/dL — ABNORMAL LOW (ref 6–20)
CO2: 25 mmol/L (ref 22–32)
Calcium: 7.8 mg/dL — ABNORMAL LOW (ref 8.9–10.3)
Chloride: 104 mmol/L (ref 98–111)
Creatinine, Ser: <0.3 mg/dL — ABNORMAL LOW (ref 0.61–1.24)
Glucose, Bld: 72 mg/dL (ref 70–99)
Potassium: 3.5 mmol/L (ref 3.5–5.1)
Sodium: 138 mmol/L (ref 135–145)

## 2019-05-17 LAB — CBC
HCT: 26.9 % — ABNORMAL LOW (ref 39.0–52.0)
Hemoglobin: 8.3 g/dL — ABNORMAL LOW (ref 13.0–17.0)
MCH: 25.7 pg — ABNORMAL LOW (ref 26.0–34.0)
MCHC: 30.9 g/dL (ref 30.0–36.0)
MCV: 83.3 fL (ref 80.0–100.0)
Platelets: 347 10*3/uL (ref 150–400)
RBC: 3.23 MIL/uL — ABNORMAL LOW (ref 4.22–5.81)
RDW: 16.9 % — ABNORMAL HIGH (ref 11.5–15.5)
WBC: 9.6 10*3/uL (ref 4.0–10.5)
nRBC: 0 % (ref 0.0–0.2)

## 2019-05-17 NOTE — TOC Progression Note (Signed)
Transition of Care [facility]) - Progression Note    Patient Details  Name: [name] [name] MRN: [id number] Date of Birth: 06/10/1965  Transition of Care [facility]) CM/SW [place], [place] Phone Number: [phone] 05/17/2019, 12:41 PM  Clinical Narrative:     Patient continues to have no bed offers at this time, CSW followed dup with [facility] SNF referral that was sent 12/31, they declined. CSW followed up with patient's preference of [facility], they declined. CSW has now followed up with patient's 3rd preference of [facility] from referral sent 12/31, pending response at this time.   Expected Discharge Plan: Skilled Nursing Facility Barriers to Discharge: Insurance Authorization  Expected Discharge Plan and Services Expected Discharge Plan: [place] Acute Care Choice: [place] Living arrangements for the past 2 months: Single Family Home                                       Social Determinants of Health (SDOH) Interventions    Readmission Risk Interventions Readmission Risk Prevention Plan 04/29/2019 04/01/2019 03/27/2019  Transportation Screening Complete Complete Complete  PCP or Specialist Appt within 5-7 Days - - -  PCP or Specialist Appt within 3-5 Days - - -  Not Complete comments - - -  Home Care Screening - - -  Medication Review (RN CM) - - -  HRI or [place] Work Consult for [place] Planning/Counseling - - -  [place] - - -  Medication Review ([place]) Complete Referral to Pharmacy Complete  PCP or Specialist appointment within 3-5 days of discharge Complete Not Complete Complete  PCP/Specialist Appt Not Complete comments - DC date unknown -  HRI or Home Care Consult Complete Complete Complete  SW Recovery Care/Counseling Consult Complete - Complete  Palliative Care Screening Not Applicable Not Complete Not Applicable  Comments - no desire -   Skilled Nursing Facility Complete Not Applicable Not Applicable  Some recent data might be hidden

## 2019-05-17 NOTE — Progress Notes (Signed)
PROGRESS NOTE    [name] [name]  [id number] DOB: [birth year] DOA: 05/10/2019 PCP: Andria Frames, PA-C (Confirm with patient/family/NH records and if not entered, this HAS to be entered at [facility] point of entry. "No PCP" if truly none.)   Brief Narrative: (Start on day 1 of progress note - keep it brief and live)  [name] [name] is a 54 year old African-American male with history of quadriplegia, chronic decubitus ulcers, recurrent UTI, colostomy and chronic suprapubic catheter, chronic hypotension on midodrine admitted with chills, nausea, vomiting and diarrhea.Stool tested positive for C. difficile and on oral vancomycin. He was started on cefepime due to concern for UTI, which has since been discontinued due to no organism shown on urine culture.   1/1 - stable and awaiting SNF  Assessment & Plan:   Principal Problem:   Sepsis ([place]) Active Problems:   Quadriplegia ([place])   Anemia of chronic disease   Pressure injury of skin   Sepsis associated hypotension ([name]   Colitis due to Clostridioides difficile   Sepsis secondary to C. difficile -On oral vancomycin since 12/26 will continue -Blood cultures negative - no fever last 24 hrs, abd benign - improved, WBC down  Bacteriuria -Urine culture revealed multiple organisms present suspect chronic colonization w/ chronic suprapubic tube -Stopped cefepime  Quadriplegia with chronic colostomy, suprapubic catheter use -Continue home baclofen, Reglan  Chronic hypotension -Continue midodrine  Hypomagnesemia -Replaced, trend  Pressure injuries of skin  - continue dressings, nursing care  DVT prophylaxis: Lovenox Code Status: Full Family Communication: none today Disposition Plan: SNF as soon as bed available    Consultants:   Palliative care  Antimicrobials: (specify start and planned stop date. Auto populated tables are space occupying and do not give end dates)  Vancomycin 12/26   Subjective: Hungry,  wants reg diet No other c/o  Objective: Vitals:   05/16/19 0750 05/16/19 2100 05/17/19 0810 05/17/19 0812  BP: (!) 134/98 102/72 117/86 117/86  Pulse: 98 90 99   Resp:   16   Temp: 98.4 F (36.9 C) 98.2 F (36.8 C) 99.3 F (37.4 C)   TempSrc: Oral Oral Oral   SpO2: 100% 100% 100%   Weight:      Height:        Intake/Output Summary (Last 24 hours) at 05/17/2019 1005 Last data filed at 05/17/2019 [id number] Gross per 24 hour  Intake 830 ml  Output 1700 ml  Net -870 ml   Filed Weights   05/11/19 0506 05/11/19 0649  Weight: 83 kg 82.2 kg    Examination:  General:  NAD - chronically ill Eyes:   anicteric Lungs:  Clear ant- poor effort Heart::  S1S2 no rubs, murmurs or gallops, no edema Abdomen:  soft and nontender, BS+ LL colostomy bag liquid stool/gas Ext:   Contractures Skin:  Multiple dressings on pressure wounds and scars from old wounds that are healed Neuro:  Mental status intact Psych:  Appropriate  Data Reviewed: I have personally reviewed following labs and imaging studies  CBC: Recent Labs  Lab 05/10/19 2230 05/11/19 0527 05/13/19 1502 05/14/19 0753 05/17/19 0752  WBC 5.8 8.2 5.9 6.8 9.6  NEUTROABS 4.4  --   --   --   --   HGB 9.6* 7.6* 7.7* 7.8* 8.3*  HCT 31.9* 25.4* 25.2* 25.1* 26.9*  MCV 87.4 89.4 85.4 84.8 83.3  PLT 400 345 363 363 [id number]   Basic Metabolic Panel: Recent Labs  Lab 05/13/19 1328 05/14/19 0753 05/15/19 0819 05/16/19 0608 05/17/19  0752  NA 140 140 138 138 138  K 4.2 2.6* 4.1 3.5 3.5  CL 110 107 107 105 104  CO2 20* 24 24 23 25   GLUCOSE 101* 85 85 98 72  BUN 7 <5* <5* 5* <5*  CREATININE <0.30* <0.30* <0.30* 0.52* <0.30*  CALCIUM 7.6* 7.7* 7.8* 8.1* 7.8*  MG  --   --  1.3* 1.6* 1.6*   GFR: CrCl cannot be calculated (This lab value cannot be used to calculate CrCl because it is not a number: <0.30). Liver Function Tests: Recent Labs  Lab 05/10/19 2230  AST 24  ALT 17  ALKPHOS 81  BILITOT 0.3  PROT 6.1*  ALBUMIN 2.0*    Coagulation Profile: Recent Labs  Lab 05/10/19 2230  INR 1.0   Sepsis Labs: Recent Labs  Lab 05/10/19 2230 05/11/19 1304  LATICACIDVEN 1.7 1.2    Recent Results (from the past 240 hour(s))  Blood Culture (routine x 2)     Status: None   Collection Time: 05/10/19 10:35 PM   Specimen: BLOOD  Result Value Ref Range Status   Specimen Description BLOOD LEFT ANTECUBITAL  Final   Special Requests   Final    BOTTLES DRAWN AEROBIC AND ANAEROBIC Blood Culture adequate volume   Culture   Final    NO GROWTH 5 DAYS Performed at [facility], [address], [place], [place] [address]    Report Status 05/15/2019 FINAL  Final  Respiratory Panel by RT PCR (Flu A&B, Covid) - Nasopharyngeal Swab     Status: None   Collection Time: 05/11/19  1:30 AM   Specimen: Nasopharyngeal Swab  Result Value Ref Range Status   SARS Coronavirus 2 by RT PCR NEGATIVE NEGATIVE Final    Comment: (NOTE) SARS-CoV-2 target nucleic acids are NOT DETECTED. The SARS-CoV-2 RNA is generally detectable in upper respiratoy specimens during the acute phase of infection. The lowest concentration of SARS-CoV-2 viral copies this assay can detect is 131 copies/mL. A negative result does not preclude SARS-Cov-2 infection and should not be used as the sole basis for treatment or other patient management decisions. A negative result may occur with  improper specimen collection/handling, submission of specimen other than nasopharyngeal swab, presence of viral mutation(s) within the areas targeted by this assay, and inadequate number of viral copies (<131 copies/mL). A negative result must be combined with clinical observations, patient history, and epidemiological information. The expected result is Negative. Fact Sheet for Patients:  PinkCheek.be Fact Sheet for Healthcare Providers:  GravelBags.it This test is not yet ap proved or cleared by the [place] FDA and  has been authorized for detection and/or diagnosis of SARS-CoV-2 by FDA under an Emergency Use Authorization (EUA). This EUA will remain  in effect (meaning this test can be used) for the duration of the COVID-19 declaration under Section 564(b)(1) of the Act, 21 U.S.C. section 360bbb-3(b)(1), unless the authorization is terminated or revoked sooner.    Influenza A by PCR NEGATIVE NEGATIVE Final   Influenza B by PCR NEGATIVE NEGATIVE Final    Comment: (NOTE) The Xpert Xpress SARS-CoV-2/FLU/RSV assay is intended as an aid in  the diagnosis of influenza from Nasopharyngeal swab specimens and  should not be used as a sole basis for treatment. Nasal washings and  aspirates are unacceptable for Xpert Xpress SARS-CoV-2/FLU/RSV  testing. Fact Sheet for Patients: PinkCheek.be Fact Sheet for Healthcare Providers: GravelBags.it This test is not yet approved or cleared by the [place] FDA and  has  been authorized for detection and/or diagnosis of SARS-CoV-2 by  FDA under an Emergency Use Authorization (EUA). This EUA will remain  in effect (meaning this test can be used) for the duration of the  Covid-19 declaration under Section 564(b)(1) of the Act, 21  U.S.C. section 360bbb-3(b)(1), unless the authorization is  terminated or revoked. Performed at [facility], [place] [address], [place], [place] [address]   MRSA PCR Screening     Status: Abnormal   Collection Time: 05/11/19  8:48 AM   Specimen: Nasal Mucosa; Nasopharyngeal  Result Value Ref Range Status   MRSA by PCR POSITIVE (A) NEGATIVE Final    Comment:        The GeneXpert MRSA Assay (FDA approved for NASAL specimens only), is one component of a comprehensive MRSA colonization surveillance program. It is not intended to diagnose MRSA infection nor to guide or monitor treatment for MRSA infections. RESULT CALLED TO, READ BACK BY AND VERIFIED  WITH: D.MUHORO RN AT 1134 05/11/2019 BY A.DAVIS Performed at [facility], [address], [place], [place] [address]   Urine culture     Status: Abnormal   Collection Time: 05/11/19 10:11 AM   Specimen: In/Out Cath Urine  Result Value Ref Range Status   Specimen Description IN/OUT CATH URINE  Final   Special Requests   Final    NONE Performed at [facility], [place] [address], [place], [place] [address]    Culture MULTIPLE SPECIES PRESENT, SUGGEST RECOLLECTION (A)  Final   Report Status 05/14/2019 FINAL  Final  C difficile quick scan w PCR reflex     Status: Abnormal   Collection Time: 05/11/19  2:14 PM   Specimen: STOOL  Result Value Ref Range Status   C Diff antigen POSITIVE (A) NEGATIVE Final   C Diff toxin NEGATIVE NEGATIVE Final   C Diff interpretation Results are indeterminate. See PCR results.  Final    Comment: Performed at [facility], [place] [address] [place], [place] [address]  C. Diff by PCR, Reflexed     Status: Abnormal   Collection Time: 05/11/19  3:57 PM  Result Value Ref Range Status   Toxigenic C. Difficile by PCR POSITIVE (A) NEGATIVE Final    Comment: Positive for toxigenic C. difficile with little to no toxin production. Only treat if clinical presentation suggests symptomatic illness. Performed at [facility], [place] [address], [place], [place] [address]   Aerobic Culture (superficial specimen)     Status: Abnormal   Collection Time: 05/12/19  1:22 PM   Specimen: Wound  Result Value Ref Range Status   Specimen Description WOUND  Final   Special Requests NONE  Final   Gram Stain   Final    NO WBC SEEN RARE GRAM POSITIVE COCCI IN PAIRS FEW GRAM POSITIVE RODS Performed at [facility], [address], [place],  [address]    Culture MULTIPLE ORGANISMS PRESENT, NONE PREDOMINANT (A)  Final   Report Status 05/14/2019 FINAL  Final  SARS CORONAVIRUS 2 (TAT 6-24 HRS) Nasopharyngeal Nasopharyngeal Swab     Status: None    Collection Time: 05/16/19  9:09 AM   Specimen: Nasopharyngeal Swab  Result Value Ref Range Status   SARS Coronavirus 2 NEGATIVE NEGATIVE Final    Comment: (NOTE) SARS-CoV-2 target nucleic acids are NOT DETECTED. The SARS-CoV-2 RNA is generally detectable in upper and lower respiratory specimens during the acute phase of infection. Negative results do not  preclude SARS-CoV-2 infection, do not rule out co-infections with other pathogens, and should not be used as the sole basis for treatment or other patient management decisions. Negative results must be combined with clinical observations, patient history, and epidemiological information. The expected result is Negative. Fact Sheet for Patients: SugarRoll.be Fact Sheet for Healthcare Providers: https://www.woods-mathews.com/ This test is not yet approved or cleared by the [place] FDA and  has been authorized for detection and/or diagnosis of SARS-CoV-2 by FDA under an Emergency Use Authorization (EUA). This EUA will remain  in effect (meaning this test can be used) for the duration of the COVID-19 declaration under Section 56 4(b)(1) of the Act, 21 U.S.C. section 360bbb-3(b)(1), unless the authorization is terminated or revoked sooner. Performed at [facility], [place] [address], [place], [place] [address]          Radiology Studies: DG Abd 1 View  Result Date: 05/16/2019 CLINICAL DATA:  Sepsis, diarrhea EXAM: ABDOMEN - 1 VIEW COMPARISON:  03/23/2019 FINDINGS: Mild diffuse gaseous distention of bowel. No evidence for bowel obstruction. No organomegaly or free air. No acute bony abnormality. IMPRESSION: Diffuse gaseous distention of bowel may reflect mild ileus. No obstruction or free air. Electronically Signed   By: Rolm Baptise M.D.   On: 05/16/2019 10:39        Scheduled Meds: . baclofen  20 mg Oral BID  . Chlorhexidine Gluconate Cloth  6 each Topical Daily  .  enoxaparin (LOVENOX) injection  40 mg Subcutaneous Q24H  . feeding supplement (PRO-STAT SUGAR FREE 64)  30 mL Oral BID  . ferrous sulfate  325 mg Oral Q breakfast  . fesoterodine  8 mg Oral Daily  . metoCLOPramide  10 mg Oral TID WC  . midodrine  10 mg Oral TID WC  . multivitamin with minerals  1 tablet Oral Daily  . nutrition supplement (JUVEN)  1 packet Oral BID BM  . pantoprazole  40 mg Oral BID  . Ensure Max Protein  11 oz Oral Daily  . sodium chloride flush  10-40 mL Intracatheter Q12H  . sucralfate  1 g Oral QID  . vancomycin  125 mg Oral QID   Continuous Infusions:   LOS: 6 days    Time spent: 25    Silvano Rusk, MD [facility] Pager [phone]  If 7PM-7AM, please contact night-coverage [email or web address] Password [email or web address] 05/17/2019, 10:05 AM

## 2019-05-17 NOTE — Plan of Care (Signed)

## 2019-05-18 ENCOUNTER — Inpatient Hospital Stay (HOSPITAL_COMMUNITY): Payer: Medicare Other

## 2019-05-18 DIAGNOSIS — L8990 Pressure ulcer of unspecified site, unspecified stage: Secondary | ICD-10-CM

## 2019-05-18 DIAGNOSIS — I959 Hypotension, unspecified: Secondary | ICD-10-CM

## 2019-05-18 LAB — URINALYSIS, ROUTINE W REFLEX MICROSCOPIC
Bilirubin Urine: NEGATIVE
Glucose, UA: NEGATIVE mg/dL
Hgb urine dipstick: NEGATIVE
Ketones, ur: NEGATIVE mg/dL
Nitrite: NEGATIVE
Protein, ur: 30 mg/dL — AB
Specific Gravity, Urine: 1.014 (ref 1.005–1.030)
WBC, UA: >50 WBC/hpf — ABNORMAL HIGH (ref 0–5)
pH: 6 (ref 5.0–8.0)

## 2019-05-18 LAB — CBC WITH DIFFERENTIAL/PLATELET
Abs Immature Granulocytes: 0 10*3/uL (ref 0.00–0.07)
Basophils Absolute: 0 10*3/uL (ref 0.0–0.1)
Basophils Relative: 0 %
Eosinophils Absolute: 0.2 10*3/uL (ref 0.0–0.5)
Eosinophils Relative: 2 %
HCT: 27.4 % — ABNORMAL LOW (ref 39.0–52.0)
Hemoglobin: 8.3 g/dL — ABNORMAL LOW (ref 13.0–17.0)
Lymphocytes Relative: 10 %
Lymphs Abs: 1.1 10*3/uL (ref 0.7–4.0)
MCH: 25.5 pg — ABNORMAL LOW (ref 26.0–34.0)
MCHC: 30.3 g/dL (ref 30.0–36.0)
MCV: 84 fL (ref 80.0–100.0)
Monocytes Absolute: 0.3 10*3/uL (ref 0.1–1.0)
Monocytes Relative: 3 %
Neutro Abs: 9.5 10*3/uL — ABNORMAL HIGH (ref 1.7–7.7)
Neutrophils Relative %: 85 %
Platelets: 212 10*3/uL (ref 150–400)
RBC: 3.26 MIL/uL — ABNORMAL LOW (ref 4.22–5.81)
RDW: 16.9 % — ABNORMAL HIGH (ref 11.5–15.5)
WBC: 11.2 10*3/uL — ABNORMAL HIGH (ref 4.0–10.5)
nRBC: 0 % (ref 0.0–0.2)

## 2019-05-18 LAB — BASIC METABOLIC PANEL
Anion gap: 9 (ref 5–15)
BUN: 7 mg/dL (ref 6–20)
CO2: 25 mmol/L (ref 22–32)
Calcium: 7.8 mg/dL — ABNORMAL LOW (ref 8.9–10.3)
Chloride: 101 mmol/L (ref 98–111)
Creatinine, Ser: <0.3 mg/dL — ABNORMAL LOW (ref 0.61–1.24)
Glucose, Bld: 85 mg/dL (ref 70–99)
Potassium: 3.6 mmol/L (ref 3.5–5.1)
Sodium: 135 mmol/L (ref 135–145)

## 2019-05-18 NOTE — TOC Progression Note (Signed)
Transition of Care [facility]) - Progression Note    Patient Details  Name: [name] [name] MRN: [id number] Date of Birth: 1965-10-23  Transition of Care [facility]) CM/SW [place], [place] Phone Number: 05/18/2019, 10:02 AM  Clinical Narrative:    CSW has re-referred pt to [facility]. Expanded search for SNF further as pt has no current bed offers at this time. TOC team continues to follow.    Expected Discharge Plan: Skilled Nursing Facility Barriers to Discharge: Insurance Authorization  Expected Discharge Plan and Services Expected Discharge Plan: [place] Acute Care Choice: [place] Living arrangements for the past 2 months: Single Family Home  Readmission Risk Interventions Readmission Risk Prevention Plan 04/29/2019 04/01/2019 03/27/2019  Transportation Screening Complete Complete Complete  PCP or Specialist Appt within 5-7 Days - - -  PCP or Specialist Appt within 3-5 Days - - -  Not Complete comments - - -  Home Care Screening - - -  Medication Review (RN CM) - - -  HRI or [place] Work Consult for [place] Planning/Counseling - - -  [place] - - -  Medication Review (RN Care Manager) Complete Referral to Pharmacy Complete  PCP or Specialist appointment within 3-5 days of discharge Complete Not Complete Complete  PCP/Specialist Appt Not Complete comments - DC date unknown -  HRI or Home Care Consult Complete Complete Complete  SW Recovery Care/Counseling Consult Complete - Complete  Palliative Care Screening Not Applicable Not Complete Not Applicable  Comments - no desire -  Skilled Nursing Facility Complete Not Applicable Not Applicable  Some recent data might be hidden

## 2019-05-18 NOTE — Progress Notes (Signed)
PROGRESS NOTE    [name] [name]  [id number] DOB: [birth year] DOA: 05/10/2019 PCP: Andria Frames, PA-C   Brief Narrative:  [name] [name] a 54 year old African-American male with history of quadriplegia, chronic decubitus ulcers, recurrent UTI, colostomy and chronic suprapubic catheter, chronic hypotension on midodrine admitted with chills, nausea, vomiting and diarrhea.Stool tested positive for C. difficile and on oral vancomycin.He was started on cefepime due to concern for UTI, which has since been discontinued due to no organism shown on urine culture.   1/2-fever overnight 101.1 x 1   Assessment & Plan:   Principal Problem:   Sepsis ([name] Active Problems:   Quadriplegia ([place])   Anemia of chronic disease   Pressure injury of skin   Sepsis associated hypotension ([name]   Colitis due to Clostridioides difficile   Sepsis secondary to C. difficile -On oral vancomycin since 12/26 will continue -Blood cultures negative - fever 101.1 last PM, abd benign - improved, WBC MILD BUMP UP, BLOOD CX, U/A, UCX, AND CXR PENDING  Bacteriuria -Previous Urine culture revealed multiple organisms present suspect chronic colonization w/ chronic suprapubic tube -Stopped cefepime, if recurrence of fever low threshold for restarting antibiotic  Quadriplegia with chronic colostomy, suprapubic catheter use -Continue home baclofen, Reglan  Chronic hypotension -Continue midodrine  Hypomagnesemia -Replaced, trend  Pressure injuries of skin  - continue dressings, nursing care  DVT prophylaxis: Lovenox SQ  Code Status: full    Code Status Orders  (From admission, onward)         Start     Ordered   05/11/19 0235  Full code  Continuous     05/11/19 0235        Code Status History    Date Active Date Inactive Code Status Order ID Comments User Context   04/21/2019 1044 05/07/2019 0153 Full Code [id number]  Caren Griffins, MD Inpatient   04/01/2019 0444 04/05/2019  2241 Full Code [id number]  Jani Gravel, MD ED   03/19/2019 2244 03/28/2019 0348 Full Code [id number]  Barb Merino, MD Inpatient   03/05/2019 2305 03/14/2019 0449 Full Code [id number]  Lenore Cordia, MD ED   01/22/2019 1859 02/02/2019 0235 Full Code [id number]  Kathi Ludwig, MD ED   12/29/2018 2006 01/05/2019 0202 Full Code [id number]  Modena Nunnery D, DO ED   10/14/2018 0753 10/20/2018 1954 Full Code [id number]  Chesley Mires, MD ED   05/19/2018 0320 05/22/2018 1651 Full Code [id number]  Vianne Bulls, MD Inpatient   05/09/2018 0716 05/12/2018 0146 Full Code [id number]  Rise Patience, MD Inpatient   04/12/2018 1947 04/13/2018 2310 Full Code [id number]  Mariel Aloe, MD Inpatient   04/03/2018 1619 04/05/2018 0153 Full Code [id number]  Annita Brod, MD Inpatient   11/17/2017 2333 11/27/2017 2155 Full Code [id number]  Vianne Bulls, MD ED   10/30/2017 1829 11/01/2017 1935 Full Code [id number]  Doreatha Lew, MD ED   10/02/2017 2050 10/07/2017 2312 Full Code [id number]  Cristy Folks, MD Inpatient   09/06/2017 1459 09/10/2017 0103 Full Code [id number]  Velvet Bathe, MD Inpatient   07/19/2017 0306 07/22/2017 2227 Full Code [id number]  Rise Patience, MD ED   06/02/2017 0207 06/05/2017 2311 Full Code [id number]  Norval Morton, MD ED   05/04/2017 2006 05/07/2017 2204 Full Code [id number]  Florencia Reasons, MD Inpatient   02/28/2017 2024 03/03/2017 0121 Full Code [id number]  Elodia Florence., MD Inpatient   02/05/2017 1748 02/08/2017 2202 Full Code [id number]  Louellen Molder, MD  Inpatient   11/01/2016 0142 11/08/2016 0126 Full Code [id number]  Edwin Dada, MD ED   08/27/2016 1549 09/01/2016 2324 Full Code [id number]  Elmarie Shiley, MD Inpatient   08/01/2016 0115 08/08/2016 0325 Full Code [id number]  Norval Morton, MD Inpatient   06/17/2016 2216 06/20/2016 2318 Full Code [id number]  Sid Falcon, MD Inpatient   05/08/2016 0138 05/23/2016 0042 Full Code [id number]  Toy Baker, MD  Inpatient   03/11/2016 0009 03/16/2016 2316 Full Code [id number]  Vianne Bulls, MD ED   11/20/2015 0229 11/27/2015 2050 Full Code [id number]  Loleta Books, Suann Larry, MD Inpatient   10/08/2015 2146 10/11/2015 2323 Full Code [id number]  Edwin Dada, MD Inpatient   08/12/2015 0416 08/25/2015 2244 Full Code [id number]  Edwin Dada, MD ED   07/08/2015 0417 07/11/2015 1933 Full Code [id number]  Ivor Costa, MD ED   05/29/2015 1610 06/13/2015 0035 Full Code [id number]  Lily Kocher, MD ED   05/13/2015 0049 05/19/2015 2202 Full Code [id number]  Reubin Milan, MD Inpatient   04/27/2015 2019 05/05/2015 2233 Full Code [id number]  Etta Quill, DO ED   03/16/2015 1356 03/20/2015 2153 Full Code [id number]  Willia Craze, NP Inpatient   12/08/2014 0219 12/15/2014 1743 Full Code [id number]  Toy Baker, MD ED   10/08/2014 1546 10/10/2014 2016 Full Code [id number]  Florencia Reasons, MD Inpatient   09/02/2014 2349 09/08/2014 2056 Full Code [id number]  Theressa Millard, MD Inpatient   08/28/2014 1642 08/29/2014 1223 Full Code [id number]  [name], DO Inpatient   07/16/2014 1737 07/19/2014 1721 Full Code [id number]  Samuella Cota, MD Inpatient   04/17/2014 2100 04/22/2014 2348 Full Code [id number]  Carylon Perches, MD Inpatient   04/17/2014 1643 04/17/2014 2100 Full Code [id number]  Debbe Odea, MD ED   01/03/2014 1617 01/06/2014 1752 Full Code [id number]  Azzie Roup, MD Inpatient   12/22/2013 0239 01/03/2014 1617 Full Code [id number]  Theressa Millard, MD Inpatient   09/23/2013 2110 09/28/2013 2124 Full Code [id number]  Shanda Howells, MD ED   09/23/2013 1628 09/23/2013 2110 Full Code [id number]  Azzie Roup, MD ED   06/28/2013 1512 07/01/2013 2216 Full Code [id number]  Azzie Roup, MD Inpatient   06/21/2013 2158 06/28/2013 1512 Full Code [id number]  Etta Quill, DO ED   02/21/2013 0653 03/02/2013 2002 Full Code [id number]  Phillips Grout, MD Inpatient   01/25/2013 0136 02/01/2013 1850 Full Code  [id number]  Phillips Grout, MD Inpatient   11/07/2012 1756 11/20/2012 1709 Full Code [id number]  Elmarie Shiley, MD Inpatient   09/01/2012 2121 09/07/2012 1728 Full Code [id number]  Orvan Falconer, MD Inpatient   07/10/2012 2133 07/13/2012 2028 Full Code [id number]  Velvet Bathe, MD Inpatient   05/04/2012 0955 05/22/2012 2215 Full Code [id number]  Gurney Maxin, RN ED   04/22/2012 1429 04/26/2012 2144 Full Code [id number]  [name], RN Inpatient   01/17/2012 1820 01/24/2012 2041 Full Code [id number]  Stacie Glaze, RN ED   07/19/2011 0135 07/23/2011 2000 Full Code [id number]  Marisa Cyphers, RN ED   Advance Care Planning Activity    Advance Directive Documentation     Most Recent Value  Type of Advance Directive  Healthcare Power of Attorney, Living will  Pre-existing out of facility DNR order (yellow form or pink MOST form)  --  "MOST" Form in Place?  --     Family Communication: None today Disposition Plan: Patient remained inpatient secondary  to fever, further evaluation, patient not stable for discharge Consults called: None Admission status: Inpatient   Consultants:   Palliative care  Procedures:  DG Chest 1 View  Result Date: 05/01/2019 CLINICAL DATA:  COVID-19 positive patient.  Quadriplegia. EXAM: CHEST  1 VIEW COMPARISON:  April 28, 2019 FINDINGS: The right central line is been removed. A right PICC line is been placed, terminating in the central SVC. A layering effusion on the right is again identified with underlying opacity. Mild opacity in the left perihilar and retrocardiac regions are stable. The cardiomediastinal silhouette is stable. No other acute abnormalities. IMPRESSION: 1. A new right PICC line is in good position terminating in the central SVC. 2. Probable small bilateral layering effusions. Opacity underlies the right effusion, stable. Mild left perihilar and retrocardiac opacities are stable as well. Electronically Signed   By: Dorise Bullion III M.D   On:  05/01/2019 14:33   DG Chest 1 View  Result Date: 04/28/2019 CLINICAL DATA:  Central line placement EXAM: CHEST  1 VIEW COMPARISON:  04/21/2019 FINDINGS: Interval placement of a right neck vascular catheter, tip positioned near the superior cavoatrial junction. There are new, layering bilateral pleural effusions and associated atelectasis or consolidation. Cardiomegaly. Unchanged, rim calcified in expansile mass of the posterior right chest wall, better assessed by prior CT. IMPRESSION: 1. Interval placement of right neck vascular catheter, tip positioned near the superior cavoatrial junction. 2. New, layering bilateral pleural effusions with associated atelectasis or consolidation. 3. Cardiomegaly. Electronically Signed   By: Eddie Candle M.D.   On: 04/28/2019 14:58   DG Abd 1 View  Result Date: 05/16/2019 CLINICAL DATA:  Sepsis, diarrhea EXAM: ABDOMEN - 1 VIEW COMPARISON:  03/23/2019 FINDINGS: Mild diffuse gaseous distention of bowel. No evidence for bowel obstruction. No organomegaly or free air. No acute bony abnormality. IMPRESSION: Diffuse gaseous distention of bowel may reflect mild ileus. No obstruction or free air. Electronically Signed   By: Rolm Baptise M.D.   On: 05/16/2019 10:39   CT CHEST WO CONTRAST  Result Date: 05/02/2019 CLINICAL DATA:  Quadriplegia, prior C5 fracture, pneumonia, pleural effusion, tachycardia, leukocytosis, sepsis EXAM: CT CHEST WITHOUT CONTRAST TECHNIQUE: Multidetector CT imaging of the chest was performed following the standard protocol without IV contrast. Sagittal and coronal MPR images reconstructed from axial data set. COMPARISON:  None FINDINGS: Cardiovascular: Minimal enlargement of cardiac silhouette and minimal pericardial effusion. Ascending thoracic aorta upper normal caliber 3.7 cm diameter. Minimal atherosclerotic calcification aorta. Mediastinum/Nodes: Questionable wall thickening of the mid to distal esophagus cannot exclude esophagitis or reflux. LEFT  thyroid nodule 2.0 x 1.6 cm. Base of cervical region otherwise normal appearance. No thoracic adenopathy. Few normal size mediastinal lymph nodes seen. Lungs/Pleura: Small LEFT pleural effusion. Partially loculated small RIGHT pleural effusion. Compressive atelectasis of the lower lobes cannot exclude underlying consolidation. Minimal compressive atelectasis of the posterior RIGHT upper lobe. Remaining lungs clear. No pneumothorax or definite pulmonary mass. Upper Abdomen: Visualized upper abdomen unremarkable Musculoskeletal: Diffuse osseous demineralization. Sternum and spine unremarkable. Expansile destructive bone lesion again identified at the posterior RIGHT fifth rib at 5.9 x 4.2 x. 4.6 cm not significantly changed. Old LEFT rib fractures. Question old RIGHT rib fractures as well. Degenerative changes of the spine with postsurgical changes at C5. IMPRESSION: Small BILATERAL pleural effusions, partially loculated on LEFT. Associated compressive atelectasis of the posterior lungs greatest in lower lobes, cannot exclude underlying consolidation especially in RIGHT lower lobe. Stable expansile bone lesion of the posterior RIGHT fifth rib. LEFT  thyroid nodule, previously assessed by thyroid ultrasound and biopsy in 2019, recommend correlation with prior imaging and cytology results. Minimal pericardial effusion. Aortic Atherosclerosis (ICD10-I70.0). Electronically Signed   By: Lavonia Dana M.D.   On: 05/02/2019 11:40   IR REMOVAL TUN ACCESS W/ PORT W/O FL MOD SED  Result Date: 04/26/2019 CLINICAL DATA:  Port catheter that I placed 07/13/2016, has worked well without complication. Recent suspected bloodstream infection, prophylactic central line removal requested. EXAM: EXAM TUNNELED PORT CATHETER REMOVAL TECHNIQUE: The procedure, risks (including but not limited to bleeding, infection, organ damage ), benefits, and alternatives were explained to the patient. Questions regarding the procedure were encouraged  and answered. The patient understands and consents to the procedure. Intravenous Fentanyl 15mcg and Versed 1mg  were administered as conscious sedation during continuous monitoring of the patient's level of consciousness and physiological / cardiorespiratory status by the radiology RN, with a total moderate sedation time of 32 minutes. Overlying skin prepped with chlorhexidine, draped in usual sterile fashion, infiltrated locally with 1% lidocaine. A small incision was made over the scar from previous placement. The port catheter was dissected free from the underlying soft tissues and removed intact. Hemostasis was achieved. The port pocket was closed with deep interrupted and subcuticular continuous 3-0 Monocryl sutures, then covered with Dermabond. The patient tolerated the procedure well. COMPLICATIONS: COMPLICATIONS None immediate IMPRESSION: 1.  Technically successful tunneled Port catheter removal. Electronically Signed   By: Lucrezia Europe M.D.   On: 04/26/2019 14:49   DG CHEST PORT 1 VIEW  Result Date: 05/18/2019 CLINICAL DATA:  Update status fever EXAM: PORTABLE CHEST 1 VIEW COMPARISON:  Chest radiograph 05/10/2019 FINDINGS: Stable cardiomediastinal contours with enlarged heart size. Increased consolidation at the right lung base with small to moderate volume right pleural fluid. The left lung is clear. No pneumothorax. IMPRESSION: Increased consolidation at the right lung base with small to moderate volume right pleural fluid. Electronically Signed   By: Audie Pinto M.D.   On: 05/18/2019 11:45   DG Chest Port 1 View  Result Date: 05/10/2019 CLINICAL DATA:  Sepsis EXAM: PORTABLE CHEST 1 VIEW COMPARISON:  May 02, 2019 FINDINGS: The heart size and mediastinal contours unchanged. Patchy/hazy airspace opacity seen at the right lung base, with slight interval worsening on the prior exam. The left lung appears to be well aerated. Healed posterior left rib fractures are noted. IMPRESSION: Interval  slight worsening in the patchy airspace opacity at the right lung base which could be due to infectious etiology. Electronically Signed   By: Prudencio Pair M.D.   On: 05/10/2019 22:11   DG Chest Port 1 View  Result Date: 04/21/2019 CLINICAL DATA:  Initial evaluation for acute vomiting. EXAM: PORTABLE CHEST 1 VIEW COMPARISON:  Prior radiograph from 04/01/2019. FINDINGS: Patient is rotated to the left. Cardiomegaly grossly stable. Mediastinal silhouette within normal limits. Right-sided Port-A-Cath in place with tip overlying the cavoatrial junction, unchanged. Lungs are hypoinflated. Irregular pleuroparenchymal scarring at the right upper lobe noted, stable. No focal infiltrates. No pulmonary edema or visible pleural effusion. No pneumothorax. Osseous structures are unchanged. IMPRESSION: Stable appearance of the chest with no active cardiopulmonary disease identified. Electronically Signed   By: Jeannine Boga M.D.   On: 04/21/2019 04:31   [place] RT UPPER EXTREM LTD SOFT TISSUE NON VASCULAR  Result Date: 04/28/2019 CLINICAL DATA:  SWELLING OF THE RIGHT ARM. EXAM: ULTRASOUND RIGHT UPPER EXTREMITY LIMITED TECHNIQUE: Ultrasound examination of the upper extremity soft tissues was performed in the area of clinical concern.  COMPARISON:  None. FINDINGS: There is a small amount of edema in the subcutaneous fat of the anterior aspect of the left upper arm. There is also a small amount of fluid surrounding the distal biceps muscle and proximal aspect of the distal biceps tendon. No discrete abscess. IMPRESSION: No discrete abscess. Nonspecific subcutaneous edema as well as edema around the distal biceps muscle and proximal aspect of the a distal biceps tendon. Electronically Signed   By: Lorriane Shire M.D.   On: 04/28/2019 09:22   VAS [place] UPPER EXTREMITY VENOUS DUPLEX  Result Date: 04/28/2019 UPPER VENOUS STUDY  Indications: Swelling, and Edema Limitations: Bandages, body habitus and poor ultrasound/tissue  interface. Performing Technologist: Antonieta Pert RDMS, RVT  Examination Guidelines: A complete evaluation includes B-mode imaging, spectral Doppler, color Doppler, and power Doppler as needed of all accessible portions of each vessel. Bilateral testing is considered an integral part of a complete examination. Limited examinations for reoccurring indications may be performed as noted.  Right Findings: +----------+------------+---------+-----------+----------+-------+ RIGHT     CompressiblePhasicitySpontaneousPropertiesSummary +----------+------------+---------+-----------+----------+-------+ Subclavian    Full       Yes       Yes                      +----------+------------+---------+-----------+----------+-------+  Left Findings: +----------+------------+---------+-----------+----------+--------------+ LEFT      CompressiblePhasicitySpontaneousProperties   Summary     +----------+------------+---------+-----------+----------+--------------+ IJV           Full                                                 +----------+------------+---------+-----------+----------+--------------+ Subclavian    Full                                                 +----------+------------+---------+-----------+----------+--------------+ Axillary      Full                                                 +----------+------------+---------+-----------+----------+--------------+ Brachial      Full       Yes       Yes                             +----------+------------+---------+-----------+----------+--------------+ Radial        Full                                                 +----------+------------+---------+-----------+----------+--------------+ Ulnar                                               Not visualized +----------+------------+---------+-----------+----------+--------------+ Cephalic      Full                                                  +----------+------------+---------+-----------+----------+--------------+  Basilic     Partial                        dilated      Acute      +----------+------------+---------+-----------+----------+--------------+  Summary:  Right: No evidence of thrombosis in the subclavian.  Left: No evidence of deep vein thrombosis in the upper extremity. Findings consistent with acute superficial vein thrombosis involving the left basilic vein. However, unable to visualize the mid upper arm, obscured by bandage.  *See table(s) above for measurements and observations.  Diagnosing physician: Servando Snare MD Electronically signed by Servando Snare MD on 04/28/2019 at 3:57:19 PM.    Final    IRPICC PLACEMENT LEFT >5 YRS INC IMG GUIDE  Result Date: 04/26/2019 CLINICAL DATA:  Suspected bloodstream infection, port removal requested. Needs durable venous access for IV therapies. EXAM: PICC PLACEMENT WITH ULTRASOUND AND FLUOROSCOPY FLUOROSCOPY TIME:  0.6 minute; 184 uGym2 DAP TECHNIQUE: After written informed consent was obtained, patient was placed in the supine position on angiographic table. Patency of the left basilic vein was confirmed with ultrasound with image documentation. An appropriate skin site was determined. Skin site was marked. Region was prepped using maximum barrier technique including cap and mask, sterile gown, sterile gloves, large sterile sheet, and Chlorhexidine as cutaneous antisepsis. The region was infiltrated locally with 1% lidocaine. Under real-time ultrasound guidance, the left basilic vein was accessed with a 21 gauge micropuncture needle; the needle tip within the vein was confirmed with ultrasound image documentation. Needle exchanged over a 018 guidewire for a peel-away sheath, through which a 5-French double-lumen power injectable PICC trimmed to 46cm was advanced, positioned with its tip near the cavoatrial junction. Spot chest radiograph confirms appropriate catheter position. Catheter was  flushed per protocol and secured externally. The patient tolerated procedure well. COMPLICATIONS: COMPLICATIONS none IMPRESSION: 1. Technically successful five [place] double lumen power injectable PICC placement Electronically Signed   By: Lucrezia Europe M.D.   On: 04/26/2019 14:50   IR PICC PLACEMENT RIGHT >5 YRS INC IMG GUIDE  Result Date: 04/29/2019 INDICATION: 54 year old male quadriplegic with poor venous access. He recently had a right chest port catheter removed for bacteremia and requires line replacement for IV antibiotic therapy. He presents for placement of a PICC. EXAM: PICC LINE PLACEMENT WITH ULTRASOUND AND FLUOROSCOPIC GUIDANCE MEDICATIONS: None. ANESTHESIA/SEDATION: None. FLUOROSCOPY TIME:  Fluoroscopy Time: 0 minutes 42 seconds (3.8 mGy). COMPLICATIONS: None immediate. PROCEDURE: The patient was advised of the possible risks and complications and agreed to undergo the procedure. The patient was then brought to the angiographic suite for the procedure. The right arm was prepped with chlorhexidine, draped in the usual sterile fashion using maximum barrier technique (cap and mask, sterile gown, sterile gloves, large sterile sheet, hand hygiene and cutaneous antisepsis) and infiltrated locally with 1% Lidocaine. Ultrasound demonstrated patency of the right basilic vein, and this was documented with an image. Under real-time ultrasound guidance, this vein was accessed with a 21 gauge micropuncture needle and image documentation was performed. A 0.018 wire was introduced in to the vein. Over this, a 6 [place] dual lumen power-injectable PICC was advanced to the lower SVC/right atrial junction. Fluoroscopy during the procedure and fluoro spot radiograph confirms appropriate catheter position. The catheter was flushed and covered with a sterile dressing. Catheter length: 34 cm IMPRESSION: Successful right arm Power PICC line placement with ultrasound and fluoroscopic guidance. The catheter is ready for use.  Electronically Signed   By: Jacqulynn Cadet  M.D.   On: 04/29/2019 16:55   [place] EKG SITE RITE  Result Date: 04/28/2019 If Site Rite image not attached, placement could not be confirmed due to current cardiac rhythm.    Antimicrobials:   P.o. vancomycin started 12/26   Subjective: No acute events overnight although was noted to have a fever x1 of 101.1  Objective: Vitals:   05/17/19 1931 05/17/19 2100 05/18/19 0500 05/18/19 0735  BP:    118/80  Pulse:    100  Resp:    16  Temp:  100 F (37.8 C) 99.1 F (37.3 C) 100 F (37.8 C)  TempSrc: Oral   Oral  SpO2:    92%  Weight:      Height:        Intake/Output Summary (Last 24 hours) at 05/18/2019 1158 Last data filed at 05/18/2019 1005 Gross per 24 hour  Intake 850 ml  Output 1200 ml  Net -350 ml   Filed Weights   05/11/19 0506 05/11/19 0649  Weight: 83 kg 82.2 kg    Examination:  General exam: Appears calm and comfortable  Respiratory system: Rhonchi bilaterally, no accessory muscle use no wheezing Cardiovascular system: S1 & S2 heard, RRR. No JVD, murmurs, rubs, gallops or clicks. No pedal edema. Gastrointestinal system: Abdomen is nondistended, soft and nontender. No organomegaly or masses felt. Normal bowel sounds heard. Central nervous system: Alert and oriented.  Tractors Extremities: Warm well perfused contractures as noted above Skin: No rashes, lesions or ulcers Psychiatry: Judgement and insight appear impaired. Mood & affect flat    Data Reviewed: I have personally reviewed following labs and imaging studies  CBC: Recent Labs  Lab 05/13/19 1502 05/14/19 0753 05/17/19 0752 05/18/19 0911  WBC 5.9 6.8 9.6 11.2*  NEUTROABS  --   --   --  9.5*  HGB 7.7* 7.8* 8.3* 8.3*  HCT 25.2* 25.1* 26.9* 27.4*  MCV 85.4 84.8 83.3 84.0  PLT 363 363 347 [id number]   Basic Metabolic Panel: Recent Labs  Lab 05/14/19 0753 05/15/19 0819 05/16/19 0608 05/17/19 0752 05/18/19 0911  NA 140 138 138 138 135  K 2.6* 4.1 3.5  3.5 3.6  CL 107 107 105 104 101  CO2 24 24 23 25 25   GLUCOSE 85 85 98 72 85  BUN <5* <5* 5* <5* 7  CREATININE <0.30* <0.30* 0.52* <0.30* <0.30*  CALCIUM 7.7* 7.8* 8.1* 7.8* 7.8*  MG  --  1.3* 1.6* 1.6*  --    GFR: CrCl cannot be calculated (This lab value cannot be used to calculate CrCl because it is not a number: <0.30). Liver Function Tests: No results for input(s): AST, ALT, ALKPHOS, BILITOT, PROT, ALBUMIN in the last 168 hours. No results for input(s): LIPASE, AMYLASE in the last 168 hours. No results for input(s): AMMONIA in the last 168 hours. Coagulation Profile: No results for input(s): INR, PROTIME in the last 168 hours. Cardiac Enzymes: No results for input(s): CKTOTAL, CKMB, CKMBINDEX, TROPONINI in the last 168 hours. BNP (last 3 results) No results for input(s): PROBNP in the last 8760 hours. HbA1C: No results for input(s): HGBA1C in the last 72 hours. CBG: No results for input(s): GLUCAP in the last 168 hours. Lipid Profile: No results for input(s): CHOL, HDL, LDLCALC, TRIG, CHOLHDL, LDLDIRECT in the last 72 hours. Thyroid Function Tests: No results for input(s): TSH, T4TOTAL, FREET4, T3FREE, THYROIDAB in the last 72 hours. Anemia Panel: No results for input(s): VITAMINB12, FOLATE, FERRITIN, TIBC, IRON, RETICCTPCT in the last 72 hours.  Sepsis Labs: Recent Labs  Lab 05/11/19 1304  LATICACIDVEN 1.2    Recent Results (from the past 240 hour(s))  Blood Culture (routine x 2)     Status: None   Collection Time: 05/10/19 10:35 PM   Specimen: BLOOD  Result Value Ref Range Status   Specimen Description BLOOD LEFT ANTECUBITAL  Final   Special Requests   Final    BOTTLES DRAWN AEROBIC AND ANAEROBIC Blood Culture adequate volume   Culture   Final    NO GROWTH 5 DAYS Performed at [facility], [address], [place], [place] [address]    Report Status 05/15/2019 FINAL  Final  Respiratory Panel by RT PCR (Flu A&B, Covid) - Nasopharyngeal Swab     Status: None    Collection Time: 05/11/19  1:30 AM   Specimen: Nasopharyngeal Swab  Result Value Ref Range Status   SARS Coronavirus 2 by RT PCR NEGATIVE NEGATIVE Final    Comment: (NOTE) SARS-CoV-2 target nucleic acids are NOT DETECTED. The SARS-CoV-2 RNA is generally detectable in upper respiratoy specimens during the acute phase of infection. The lowest concentration of SARS-CoV-2 viral copies this assay can detect is 131 copies/mL. A negative result does not preclude SARS-Cov-2 infection and should not be used as the sole basis for treatment or other patient management decisions. A negative result may occur with  improper specimen collection/handling, submission of specimen other than nasopharyngeal swab, presence of viral mutation(s) within the areas targeted by this assay, and inadequate number of viral copies (<131 copies/mL). A negative result must be combined with clinical observations, patient history, and epidemiological information. The expected result is Negative. Fact Sheet for Patients:  PinkCheek.be Fact Sheet for Healthcare Providers:  GravelBags.it This test is not yet ap proved or cleared by the [place] FDA and  has been authorized for detection and/or diagnosis of SARS-CoV-2 by FDA under an Emergency Use Authorization (EUA). This EUA will remain  in effect (meaning this test can be used) for the duration of the COVID-19 declaration under Section 564(b)(1) of the Act, 21 U.S.C. section 360bbb-3(b)(1), unless the authorization is terminated or revoked sooner.    Influenza A by PCR NEGATIVE NEGATIVE Final   Influenza B by PCR NEGATIVE NEGATIVE Final    Comment: (NOTE) The Xpert Xpress SARS-CoV-2/FLU/RSV assay is intended as an aid in  the diagnosis of influenza from Nasopharyngeal swab specimens and  should not be used as a sole basis for treatment. Nasal washings and  aspirates are unacceptable for Xpert Xpress  SARS-CoV-2/FLU/RSV  testing. Fact Sheet for Patients: PinkCheek.be Fact Sheet for Healthcare Providers: GravelBags.it This test is not yet approved or cleared by the [place] FDA and  has been authorized for detection and/or diagnosis of SARS-CoV-2 by  FDA under an Emergency Use Authorization (EUA). This EUA will remain  in effect (meaning this test can be used) for the duration of the  Covid-19 declaration under Section 564(b)(1) of the Act, 21  U.S.C. section 360bbb-3(b)(1), unless the authorization is  terminated or revoked. Performed at [facility], [place] [address], [place], [place] [address]   MRSA PCR Screening     Status: Abnormal   Collection Time: 05/11/19  8:48 AM   Specimen: Nasal Mucosa; Nasopharyngeal  Result Value Ref Range Status   MRSA by PCR POSITIVE (A) NEGATIVE Final    Comment:        The GeneXpert MRSA Assay (FDA approved for NASAL specimens only), is one component of a  comprehensive MRSA colonization surveillance program. It is not intended to diagnose MRSA infection nor to guide or monitor treatment for MRSA infections. RESULT CALLED TO, READ BACK BY AND VERIFIED WITH: D.MUHORO RN AT 1134 05/11/2019 BY A.DAVIS Performed at [facility], [address], [place], [place] [address]   Urine culture     Status: Abnormal   Collection Time: 05/11/19 10:11 AM   Specimen: In/Out Cath Urine  Result Value Ref Range Status   Specimen Description IN/OUT CATH URINE  Final   Special Requests   Final    NONE Performed at [facility], [place] [address] [place], [place] [address]    Culture MULTIPLE SPECIES PRESENT, SUGGEST RECOLLECTION (A)  Final   Report Status 05/14/2019 FINAL  Final  C difficile quick scan w PCR reflex     Status: Abnormal   Collection Time: 05/11/19  2:14 PM   Specimen: STOOL  Result Value Ref Range Status   C Diff antigen POSITIVE (A) NEGATIVE Final   C Diff  toxin NEGATIVE NEGATIVE Final   C Diff interpretation Results are indeterminate. See PCR results.  Final    Comment: Performed at [facility], [place] [address] [place], [place] [address]  C. Diff by PCR, Reflexed     Status: Abnormal   Collection Time: 05/11/19  3:57 PM  Result Value Ref Range Status   Toxigenic C. Difficile by PCR POSITIVE (A) NEGATIVE Final    Comment: Positive for toxigenic C. difficile with little to no toxin production. Only treat if clinical presentation suggests symptomatic illness. Performed at [facility], [place] [address] [place], [place] [address]   Aerobic Culture (superficial specimen)     Status: Abnormal   Collection Time: 05/12/19  1:22 PM   Specimen: Wound  Result Value Ref Range Status   Specimen Description WOUND  Final   Special Requests NONE  Final   Gram Stain   Final    NO WBC SEEN RARE GRAM POSITIVE COCCI IN PAIRS FEW GRAM POSITIVE RODS Performed at [facility], [address], [place], [place] [address]    Culture MULTIPLE ORGANISMS PRESENT, NONE PREDOMINANT (A)  Final   Report Status 05/14/2019 FINAL  Final  SARS CORONAVIRUS 2 (TAT 6-24 HRS) Nasopharyngeal Nasopharyngeal Swab     Status: None   Collection Time: 05/16/19  9:09 AM   Specimen: Nasopharyngeal Swab  Result Value Ref Range Status   SARS Coronavirus 2 NEGATIVE NEGATIVE Final    Comment: (NOTE) SARS-CoV-2 target nucleic acids are NOT DETECTED. The SARS-CoV-2 RNA is generally detectable in upper and lower respiratory specimens during the acute phase of infection. Negative results do not preclude SARS-CoV-2 infection, do not rule out co-infections with other pathogens, and should not be used as the sole basis for treatment or other patient management decisions. Negative results must be combined with clinical observations, patient history, and epidemiological information. The expected result is Negative. Fact Sheet for  Patients: SugarRoll.be Fact Sheet for Healthcare Providers: https://www.woods-mathews.com/ This test is not yet approved or cleared by the [place] FDA and  has been authorized for detection and/or diagnosis of SARS-CoV-2 by FDA under an Emergency Use Authorization (EUA). This EUA will remain  in effect (meaning this test can be used) for the duration of the COVID-19 declaration under Section 56 4(b)(1) of the Act, 21 U.S.C. section 360bbb-3(b)(1), unless the authorization is terminated or revoked sooner. Performed at [facility], [place]  [address] [place], [place] [address]          Radiology Studies: DG CHEST PORT 1 VIEW  Result Date: 05/18/2019 CLINICAL DATA:  Update status fever EXAM: PORTABLE CHEST 1 VIEW COMPARISON:  Chest radiograph 05/10/2019 FINDINGS: Stable cardiomediastinal contours with enlarged heart size. Increased consolidation at the right lung base with small to moderate volume right pleural fluid. The left lung is clear. No pneumothorax. IMPRESSION: Increased consolidation at the right lung base with small to moderate volume right pleural fluid. Electronically Signed   By: Audie Pinto M.D.   On: 05/18/2019 11:45        Scheduled Meds: . baclofen  20 mg Oral BID  . Chlorhexidine Gluconate Cloth  6 each Topical Daily  . enoxaparin (LOVENOX) injection  40 mg Subcutaneous Q24H  . feeding supplement (PRO-STAT SUGAR FREE 64)  30 mL Oral BID  . ferrous sulfate  325 mg Oral Q breakfast  . fesoterodine  8 mg Oral Daily  . metoCLOPramide  10 mg Oral TID WC  . midodrine  10 mg Oral TID WC  . multivitamin with minerals  1 tablet Oral Daily  . nutrition supplement (JUVEN)  1 packet Oral BID BM  . pantoprazole  40 mg Oral BID  . Ensure Max Protein  11 oz Oral Daily  . sodium chloride flush  10-40 mL Intracatheter Q12H  . sucralfate  1 g Oral QID  . vancomycin  125 mg Oral QID   Continuous Infusions:   LOS: 7  days    Time spent: 35 min    Nicolette Bang, MD [facility]  If 7PM-7AM, please contact night-coverage  05/18/2019, 11:58 AM

## 2019-05-18 NOTE — Progress Notes (Signed)
Phlebotomy couldn't do blood culture x 2 and another phlebotomy tried again, but still failed it. Will try night shift team. HS Truman Hayward RN

## 2019-05-19 LAB — CBC WITH DIFFERENTIAL/PLATELET
Abs Immature Granulocytes: 0 10*3/uL (ref 0.00–0.07)
Basophils Absolute: 0 10*3/uL (ref 0.0–0.1)
Basophils Relative: 0 %
Eosinophils Absolute: 0.3 10*3/uL (ref 0.0–0.5)
Eosinophils Relative: 2 %
HCT: 27.6 % — ABNORMAL LOW (ref 39.0–52.0)
Hemoglobin: 8.4 g/dL — ABNORMAL LOW (ref 13.0–17.0)
Lymphocytes Relative: 8 %
Lymphs Abs: 1 10*3/uL (ref 0.7–4.0)
MCH: 25.5 pg — ABNORMAL LOW (ref 26.0–34.0)
MCHC: 30.4 g/dL (ref 30.0–36.0)
MCV: 83.6 fL (ref 80.0–100.0)
Monocytes Absolute: 0.6 10*3/uL (ref 0.1–1.0)
Monocytes Relative: 5 %
Neutro Abs: 10.7 10*3/uL — ABNORMAL HIGH (ref 1.7–7.7)
Neutrophils Relative %: 85 %
Platelets: 402 10*3/uL — ABNORMAL HIGH (ref 150–400)
RBC: 3.3 MIL/uL — ABNORMAL LOW (ref 4.22–5.81)
RDW: 16.7 % — ABNORMAL HIGH (ref 11.5–15.5)
WBC: 12.6 10*3/uL — ABNORMAL HIGH (ref 4.0–10.5)
nRBC: 0 % (ref 0.0–0.2)

## 2019-05-19 LAB — URINE CULTURE

## 2019-05-19 MED ORDER — SIMETHICONE 80 MG PO CHEW
80.0000 mg | CHEWABLE_TABLET | Freq: Four times a day (QID) | ORAL | Status: DC | PRN
  Administered 2019-05-19: 80 mg via ORAL
  Filled 2019-05-19: qty 1

## 2019-05-19 NOTE — Progress Notes (Signed)
MEWS Guidelines - (patients age 53 and over)  Red - At High Risk for Deterioration Yellow - At risk for Deterioration  1. Go to room and assess patient 2. Validate data. Is this patient's baseline? If data confirmed: 3. Is this an acute change? 4. Administer prn meds/treatments as ordered. 5. Note Sepsis score 6. Review goals of care 7. Sports coach, RRT nurse and Provider. 8. Ask Provider to come to bedside.  9. Document patient condition/interventions/response. 10. Increase frequency of vital signs and focused assessments to at least q15 minutes x 4, then q30 minutes x2. - If stable, then q1h x3, then q4h x3 and then q8h or dept. routine. - If unstable, contact Provider & RRT nurse. Prepare for possible transfer. 11. Add entry in progress notes using the smart phrase ".MEWS". 1. Go to room and assess patient 2. Validate data. Is this patient's baseline? If data confirmed: 3. Is this an acute change? 4. Administer prn meds/treatments as ordered? 5. Note Sepsis score 6. Review goals of care 7. Sports coach and Provider 8. Call RRT nurse as needed. 9. Document patient condition/interventions/response. 10. Increase frequency of vital signs and focused assessments to at least q2h x2. - If stable, then q4h x2 and then q8h or dept. routine. - If unstable, contact Provider & RRT nurse. Prepare for possible transfer. 11. Add entry in progress notes using the smart phrase ".MEWS".  Green - Likely stable Lavender - Comfort Care Only  1. Continue routine/ordered monitoring.  2. Review goals of care. 1. Continue routine/ordered monitoring. 2. Review goals of care.   New onset is his temp of 102.8.  Patient is stable, not in any distress.  He has a lot of blankets on him and room is very hot. Will give Tylenol and watch vitals.

## 2019-05-19 NOTE — Progress Notes (Signed)
Pt requesting something for gas. Placed call to Dr. Humphrey Rolls he states he will order something.

## 2019-05-19 NOTE — Progress Notes (Signed)
PROGRESS NOTE    [name] [name]  [id number] DOB: 27-Nov-1965 DOA: 05/10/2019 PCP: Andria Frames, PA-C   Brief Narrative:  [name] Kattner Dixonis a 54 year old African-American male with history of quadriplegia, chronic decubitus ulcers, recurrent UTI, colostomy and chronic suprapubic catheter, chronic hypotension on midodrine admitted with chills, nausea, vomiting and diarrhea.Stool tested positive for C. difficile and on oral vancomycin.He was started on cefepime due to concern for UTI, which has since been discontinued due to no organism shown on urine culture.   1/2-fever overnight 101.1 x 1, NONE SINCE, Tax last 24 100.3   Assessment & Plan:   Principal Problem:   Sepsis ([place]) Active Problems:   Quadriplegia ([place])   Anemia of chronic disease   Pressure injury of skin   Sepsis associated hypotension ([name]   Colitis due to Clostridioides difficile   Sepsis secondary to C. difficile -On oral vancomycinsince 12/26 will continue -Blood cultures negative -ucx contam  Bacteriuria -Previous Urine culture revealed multiple organisms presentsuspect chronic colonization w/ chronic suprapubic tube -Stoppedcefepime, if recurrence of fever low threshold for restarting antibiotic  Quadriplegia with chronic colostomy, suprapubic catheter use -Continue home baclofen, Reglan  Chronic hypotension -Continue midodrine  Hypomagnesemia -Replaced, trend  Pressure injuries of skin  - continue dressings, nursing care  DVT prophylaxis: Lovenox SQ  Code Status: full    Code Status Orders  (From admission, onward)         Start     Ordered   05/11/19 0235  Full code  Continuous     05/11/19 0235        Code Status History    Date Active Date Inactive Code Status Order ID Comments User Context   04/21/2019 1044 05/07/2019 0153 Full Code [id number]  Caren Griffins, MD Inpatient   04/01/2019 0444 04/05/2019 2241 Full Code [id number]  Jani Gravel, MD ED   03/19/2019  2244 03/28/2019 0348 Full Code [id number]  Barb Merino, MD Inpatient   03/05/2019 2305 03/14/2019 0449 Full Code [id number]  Lenore Cordia, MD ED   01/22/2019 1859 02/02/2019 0235 Full Code [id number]  Kathi Ludwig, MD ED   12/29/2018 2006 01/05/2019 0202 Full Code [id number]  Modena Nunnery D, DO ED   10/14/2018 0753 10/20/2018 1954 Full Code [id number]  Chesley Mires, MD ED   05/19/2018 0320 05/22/2018 1651 Full Code [id number]  Vianne Bulls, MD Inpatient   05/09/2018 0716 05/12/2018 0146 Full Code [id number]  Rise Patience, MD Inpatient   04/12/2018 1947 04/13/2018 2310 Full Code [id number]  Mariel Aloe, MD Inpatient   04/03/2018 1619 04/05/2018 0153 Full Code [id number]  Annita Brod, MD Inpatient   11/17/2017 2333 11/27/2017 2155 Full Code [id number]  Vianne Bulls, MD ED   10/30/2017 1829 11/01/2017 1935 Full Code [id number]  Doreatha Lew, MD ED   10/02/2017 2050 10/07/2017 2312 Full Code [id number]  Cristy Folks, MD Inpatient   09/06/2017 1459 09/10/2017 0103 Full Code [id number]  Velvet Bathe, MD Inpatient   07/19/2017 0306 07/22/2017 2227 Full Code [id number]  Rise Patience, MD ED   06/02/2017 0207 06/05/2017 2311 Full Code [id number]  Norval Morton, MD ED   05/04/2017 2006 05/07/2017 2204 Full Code [id number]  Florencia Reasons, MD Inpatient   02/28/2017 2024 03/03/2017 0121 Full Code [id number]  Elodia Florence., MD Inpatient   02/05/2017 1748 02/08/2017 2202 Full Code [id number]  Louellen Molder, MD Inpatient   11/01/2016 0142 11/08/2016 0126 Full Code [id number]  Danford, Suann Larry, MD  ED   08/27/2016 1549 09/01/2016 2324 Full Code [id number]  Elmarie Shiley, MD Inpatient   08/01/2016 0115 08/08/2016 0325 Full Code [id number]  Norval Morton, MD Inpatient   06/17/2016 2216 06/20/2016 2318 Full Code [id number]  Sid Falcon, MD Inpatient   05/08/2016 0138 05/23/2016 0042 Full Code [id number]  Toy Baker, MD Inpatient   03/11/2016 0009 03/16/2016 2316 Full Code  [id number]  Vianne Bulls, MD ED   11/20/2015 0229 11/27/2015 2050 Full Code [id number]  Loleta Books, Suann Larry, MD Inpatient   10/08/2015 2146 10/11/2015 2323 Full Code [id number]  Edwin Dada, MD Inpatient   08/12/2015 0416 08/25/2015 2244 Full Code [id number]  Edwin Dada, MD ED   07/08/2015 0417 07/11/2015 1933 Full Code [id number]  Ivor Costa, MD ED   05/29/2015 1610 06/13/2015 0035 Full Code [id number]  Lily Kocher, MD ED   05/13/2015 0049 05/19/2015 2202 Full Code [id number]  Reubin Milan, MD Inpatient   04/27/2015 2019 05/05/2015 2233 Full Code [id number]  Etta Quill, DO ED   03/16/2015 1356 03/20/2015 2153 Full Code [id number]  Willia Craze, NP Inpatient   12/08/2014 0219 12/15/2014 1743 Full Code [id number]  Toy Baker, MD ED   10/08/2014 1546 10/10/2014 2016 Full Code [id number]  Florencia Reasons, MD Inpatient   09/02/2014 2349 09/08/2014 2056 Full Code [id number]  Theressa Millard, MD Inpatient   08/28/2014 1642 08/29/2014 1223 Full Code [id number]  [name], DO Inpatient   07/16/2014 1737 07/19/2014 1721 Full Code [id number]  Samuella Cota, MD Inpatient   04/17/2014 2100 04/22/2014 2348 Full Code [id number]  Carylon Perches, MD Inpatient   04/17/2014 1643 04/17/2014 2100 Full Code [id number]  Debbe Odea, MD ED   01/03/2014 1617 01/06/2014 1752 Full Code [id number]  Azzie Roup, MD Inpatient   12/22/2013 0239 01/03/2014 1617 Full Code [id number]  Theressa Millard, MD Inpatient   09/23/2013 2110 09/28/2013 2124 Full Code [id number]  Shanda Howells, MD ED   09/23/2013 1628 09/23/2013 2110 Full Code [id number]  Azzie Roup, MD ED   06/28/2013 1512 07/01/2013 2216 Full Code [id number]  Azzie Roup, MD Inpatient   06/21/2013 2158 06/28/2013 1512 Full Code [id number]  Etta Quill, DO ED   02/21/2013 0653 03/02/2013 2002 Full Code [id number]  Phillips Grout, MD Inpatient   01/25/2013 0136 02/01/2013 1850 Full Code [id number]  Phillips Grout, MD Inpatient   11/07/2012 1756  11/20/2012 1709 Full Code [id number]  Elmarie Shiley, MD Inpatient   09/01/2012 2121 09/07/2012 1728 Full Code [id number]  Orvan Falconer, MD Inpatient   07/10/2012 2133 07/13/2012 2028 Full Code [id number]  Velvet Bathe, MD Inpatient   05/04/2012 0955 05/22/2012 2215 Full Code [id number]  Gurney Maxin, RN ED   04/22/2012 1429 04/26/2012 2144 Full Code [id number]  [name], RN Inpatient   01/17/2012 1820 01/24/2012 2041 Full Code [id number]  Stacie Glaze, RN ED   07/19/2011 0135 07/23/2011 2000 Full Code [id number]  Marisa Cyphers, RN ED   Advance Care Planning Activity    Advance Directive Documentation     Most Recent Value  Type of Advance Directive  Healthcare Power of Attorney, Living will  Pre-existing out of facility DNR order (yellow form or pink MOST form)  --  "MOST" Form in Place?  --     Family Communication: none today Disposition Plan: Patient remained inpatient for continued treatment of infection, continued PT and OT, probable need for inpatient skilled nursing facility  post hospitalization. Consults called: None Admission status: Inpatient   Consultants:   Palliative care  Procedures:  DG Chest 1 View  Result Date: 05/01/2019 CLINICAL DATA:  COVID-19 positive patient.  Quadriplegia. EXAM: CHEST  1 VIEW COMPARISON:  April 28, 2019 FINDINGS: The right central line is been removed. A right PICC line is been placed, terminating in the central SVC. A layering effusion on the right is again identified with underlying opacity. Mild opacity in the left perihilar and retrocardiac regions are stable. The cardiomediastinal silhouette is stable. No other acute abnormalities. IMPRESSION: 1. A new right PICC line is in good position terminating in the central SVC. 2. Probable small bilateral layering effusions. Opacity underlies the right effusion, stable. Mild left perihilar and retrocardiac opacities are stable as well. Electronically Signed   By: Dorise Bullion III M.D   On:  05/01/2019 14:33   DG Chest 1 View  Result Date: 04/28/2019 CLINICAL DATA:  Central line placement EXAM: CHEST  1 VIEW COMPARISON:  04/21/2019 FINDINGS: Interval placement of a right neck vascular catheter, tip positioned near the superior cavoatrial junction. There are new, layering bilateral pleural effusions and associated atelectasis or consolidation. Cardiomegaly. Unchanged, rim calcified in expansile mass of the posterior right chest wall, better assessed by prior CT. IMPRESSION: 1. Interval placement of right neck vascular catheter, tip positioned near the superior cavoatrial junction. 2. New, layering bilateral pleural effusions with associated atelectasis or consolidation. 3. Cardiomegaly. Electronically Signed   By: Eddie Candle M.D.   On: 04/28/2019 14:58   DG Abd 1 View  Result Date: 05/16/2019 CLINICAL DATA:  Sepsis, diarrhea EXAM: ABDOMEN - 1 VIEW COMPARISON:  03/23/2019 FINDINGS: Mild diffuse gaseous distention of bowel. No evidence for bowel obstruction. No organomegaly or free air. No acute bony abnormality. IMPRESSION: Diffuse gaseous distention of bowel may reflect mild ileus. No obstruction or free air. Electronically Signed   By: Rolm Baptise M.D.   On: 05/16/2019 10:39   CT CHEST WO CONTRAST  Result Date: 05/02/2019 CLINICAL DATA:  Quadriplegia, prior C5 fracture, pneumonia, pleural effusion, tachycardia, leukocytosis, sepsis EXAM: CT CHEST WITHOUT CONTRAST TECHNIQUE: Multidetector CT imaging of the chest was performed following the standard protocol without IV contrast. Sagittal and coronal MPR images reconstructed from axial data set. COMPARISON:  None FINDINGS: Cardiovascular: Minimal enlargement of cardiac silhouette and minimal pericardial effusion. Ascending thoracic aorta upper normal caliber 3.7 cm diameter. Minimal atherosclerotic calcification aorta. Mediastinum/Nodes: Questionable wall thickening of the mid to distal esophagus cannot exclude esophagitis or reflux. LEFT  thyroid nodule 2.0 x 1.6 cm. Base of cervical region otherwise normal appearance. No thoracic adenopathy. Few normal size mediastinal lymph nodes seen. Lungs/Pleura: Small LEFT pleural effusion. Partially loculated small RIGHT pleural effusion. Compressive atelectasis of the lower lobes cannot exclude underlying consolidation. Minimal compressive atelectasis of the posterior RIGHT upper lobe. Remaining lungs clear. No pneumothorax or definite pulmonary mass. Upper Abdomen: Visualized upper abdomen unremarkable Musculoskeletal: Diffuse osseous demineralization. Sternum and spine unremarkable. Expansile destructive bone lesion again identified at the posterior RIGHT fifth rib at 5.9 x 4.2 x. 4.6 cm not significantly changed. Old LEFT rib fractures. Question old RIGHT rib fractures as well. Degenerative changes of the spine with postsurgical changes at C5. IMPRESSION: Small BILATERAL pleural effusions, partially loculated on LEFT. Associated compressive atelectasis of the posterior lungs greatest in lower lobes, cannot exclude underlying consolidation especially in RIGHT lower lobe. Stable expansile bone lesion of the posterior RIGHT fifth rib. LEFT thyroid nodule, previously assessed by thyroid ultrasound  and biopsy in 2019, recommend correlation with prior imaging and cytology results. Minimal pericardial effusion. Aortic Atherosclerosis (ICD10-I70.0). Electronically Signed   By: Lavonia Dana M.D.   On: 05/02/2019 11:40   IR REMOVAL TUN ACCESS W/ PORT W/O FL MOD SED  Result Date: 04/26/2019 CLINICAL DATA:  Port catheter that I placed 07/13/2016, has worked well without complication. Recent suspected bloodstream infection, prophylactic central line removal requested. EXAM: EXAM TUNNELED PORT CATHETER REMOVAL TECHNIQUE: The procedure, risks (including but not limited to bleeding, infection, organ damage ), benefits, and alternatives were explained to the patient. Questions regarding the procedure were encouraged  and answered. The patient understands and consents to the procedure. Intravenous Fentanyl 52mcg and Versed 1mg  were administered as conscious sedation during continuous monitoring of the patient's level of consciousness and physiological / cardiorespiratory status by the radiology RN, with a total moderate sedation time of 32 minutes. Overlying skin prepped with chlorhexidine, draped in usual sterile fashion, infiltrated locally with 1% lidocaine. A small incision was made over the scar from previous placement. The port catheter was dissected free from the underlying soft tissues and removed intact. Hemostasis was achieved. The port pocket was closed with deep interrupted and subcuticular continuous 3-0 Monocryl sutures, then covered with Dermabond. The patient tolerated the procedure well. COMPLICATIONS: COMPLICATIONS None immediate IMPRESSION: 1.  Technically successful tunneled Port catheter removal. Electronically Signed   By: Lucrezia Europe M.D.   On: 04/26/2019 14:49   DG CHEST PORT 1 VIEW  Result Date: 05/18/2019 CLINICAL DATA:  Update status fever EXAM: PORTABLE CHEST 1 VIEW COMPARISON:  Chest radiograph 05/10/2019 FINDINGS: Stable cardiomediastinal contours with enlarged heart size. Increased consolidation at the right lung base with small to moderate volume right pleural fluid. The left lung is clear. No pneumothorax. IMPRESSION: Increased consolidation at the right lung base with small to moderate volume right pleural fluid. Electronically Signed   By: Audie Pinto M.D.   On: 05/18/2019 11:45   DG Chest Port 1 View  Result Date: 05/10/2019 CLINICAL DATA:  Sepsis EXAM: PORTABLE CHEST 1 VIEW COMPARISON:  May 02, 2019 FINDINGS: The heart size and mediastinal contours unchanged. Patchy/hazy airspace opacity seen at the right lung base, with slight interval worsening on the prior exam. The left lung appears to be well aerated. Healed posterior left rib fractures are noted. IMPRESSION: Interval  slight worsening in the patchy airspace opacity at the right lung base which could be due to infectious etiology. Electronically Signed   By: Prudencio Pair M.D.   On: 05/10/2019 22:11   DG Chest Port 1 View  Result Date: 04/21/2019 CLINICAL DATA:  Initial evaluation for acute vomiting. EXAM: PORTABLE CHEST 1 VIEW COMPARISON:  Prior radiograph from 04/01/2019. FINDINGS: Patient is rotated to the left. Cardiomegaly grossly stable. Mediastinal silhouette within normal limits. Right-sided Port-A-Cath in place with tip overlying the cavoatrial junction, unchanged. Lungs are hypoinflated. Irregular pleuroparenchymal scarring at the right upper lobe noted, stable. No focal infiltrates. No pulmonary edema or visible pleural effusion. No pneumothorax. Osseous structures are unchanged. IMPRESSION: Stable appearance of the chest with no active cardiopulmonary disease identified. Electronically Signed   By: Jeannine Boga M.D.   On: 04/21/2019 04:31   [place] RT UPPER EXTREM LTD SOFT TISSUE NON VASCULAR  Result Date: 04/28/2019 CLINICAL DATA:  SWELLING OF THE RIGHT ARM. EXAM: ULTRASOUND RIGHT UPPER EXTREMITY LIMITED TECHNIQUE: Ultrasound examination of the upper extremity soft tissues was performed in the area of clinical concern. COMPARISON:  None. FINDINGS: There is a  small amount of edema in the subcutaneous fat of the anterior aspect of the left upper arm. There is also a small amount of fluid surrounding the distal biceps muscle and proximal aspect of the distal biceps tendon. No discrete abscess. IMPRESSION: No discrete abscess. Nonspecific subcutaneous edema as well as edema around the distal biceps muscle and proximal aspect of the a distal biceps tendon. Electronically Signed   By: Lorriane Shire M.D.   On: 04/28/2019 09:22   VAS [place] UPPER EXTREMITY VENOUS DUPLEX  Result Date: 04/28/2019 UPPER VENOUS STUDY  Indications: Swelling, and Edema Limitations: Bandages, body habitus and poor ultrasound/tissue  interface. Performing Technologist: Antonieta Pert RDMS, RVT  Examination Guidelines: A complete evaluation includes B-mode imaging, spectral Doppler, color Doppler, and power Doppler as needed of all accessible portions of each vessel. Bilateral testing is considered an integral part of a complete examination. Limited examinations for reoccurring indications may be performed as noted.  Right Findings: +----------+------------+---------+-----------+----------+-------+ RIGHT     CompressiblePhasicitySpontaneousPropertiesSummary +----------+------------+---------+-----------+----------+-------+ Subclavian    Full       Yes       Yes                      +----------+------------+---------+-----------+----------+-------+  Left Findings: +----------+------------+---------+-----------+----------+--------------+ LEFT      CompressiblePhasicitySpontaneousProperties   Summary     +----------+------------+---------+-----------+----------+--------------+ IJV           Full                                                 +----------+------------+---------+-----------+----------+--------------+ Subclavian    Full                                                 +----------+------------+---------+-----------+----------+--------------+ Axillary      Full                                                 +----------+------------+---------+-----------+----------+--------------+ Brachial      Full       Yes       Yes                             +----------+------------+---------+-----------+----------+--------------+ Radial        Full                                                 +----------+------------+---------+-----------+----------+--------------+ Ulnar                                               Not visualized +----------+------------+---------+-----------+----------+--------------+ Cephalic      Full                                                  +----------+------------+---------+-----------+----------+--------------+  Basilic     Partial                        dilated      Acute      +----------+------------+---------+-----------+----------+--------------+  Summary:  Right: No evidence of thrombosis in the subclavian.  Left: No evidence of deep vein thrombosis in the upper extremity. Findings consistent with acute superficial vein thrombosis involving the left basilic vein. However, unable to visualize the mid upper arm, obscured by bandage.  *See table(s) above for measurements and observations.  Diagnosing physician: Servando Snare MD Electronically signed by Servando Snare MD on 04/28/2019 at 3:57:19 PM.    Final    IRPICC PLACEMENT LEFT >5 YRS INC IMG GUIDE  Result Date: 04/26/2019 CLINICAL DATA:  Suspected bloodstream infection, port removal requested. Needs durable venous access for IV therapies. EXAM: PICC PLACEMENT WITH ULTRASOUND AND FLUOROSCOPY FLUOROSCOPY TIME:  0.6 minute; 184 uGym2 DAP TECHNIQUE: After written informed consent was obtained, patient was placed in the supine position on angiographic table. Patency of the left basilic vein was confirmed with ultrasound with image documentation. An appropriate skin site was determined. Skin site was marked. Region was prepped using maximum barrier technique including cap and mask, sterile gown, sterile gloves, large sterile sheet, and Chlorhexidine as cutaneous antisepsis. The region was infiltrated locally with 1% lidocaine. Under real-time ultrasound guidance, the left basilic vein was accessed with a 21 gauge micropuncture needle; the needle tip within the vein was confirmed with ultrasound image documentation. Needle exchanged over a 018 guidewire for a peel-away sheath, through which a 5-French double-lumen power injectable PICC trimmed to 46cm was advanced, positioned with its tip near the cavoatrial junction. Spot chest radiograph confirms appropriate catheter position. Catheter was  flushed per protocol and secured externally. The patient tolerated procedure well. COMPLICATIONS: COMPLICATIONS none IMPRESSION: 1. Technically successful five [place] double lumen power injectable PICC placement Electronically Signed   By: Lucrezia Europe M.D.   On: 04/26/2019 14:50   IR PICC PLACEMENT RIGHT >5 YRS INC IMG GUIDE  Result Date: 04/29/2019 INDICATION: 54 year old male quadriplegic with poor venous access. He recently had a right chest port catheter removed for bacteremia and requires line replacement for IV antibiotic therapy. He presents for placement of a PICC. EXAM: PICC LINE PLACEMENT WITH ULTRASOUND AND FLUOROSCOPIC GUIDANCE MEDICATIONS: None. ANESTHESIA/SEDATION: None. FLUOROSCOPY TIME:  Fluoroscopy Time: 0 minutes 42 seconds (3.8 mGy). COMPLICATIONS: None immediate. PROCEDURE: The patient was advised of the possible risks and complications and agreed to undergo the procedure. The patient was then brought to the angiographic suite for the procedure. The right arm was prepped with chlorhexidine, draped in the usual sterile fashion using maximum barrier technique (cap and mask, sterile gown, sterile gloves, large sterile sheet, hand hygiene and cutaneous antisepsis) and infiltrated locally with 1% Lidocaine. Ultrasound demonstrated patency of the right basilic vein, and this was documented with an image. Under real-time ultrasound guidance, this vein was accessed with a 21 gauge micropuncture needle and image documentation was performed. A 0.018 wire was introduced in to the vein. Over this, a 6 [place] dual lumen power-injectable PICC was advanced to the lower SVC/right atrial junction. Fluoroscopy during the procedure and fluoro spot radiograph confirms appropriate catheter position. The catheter was flushed and covered with a sterile dressing. Catheter length: 34 cm IMPRESSION: Successful right arm Power PICC line placement with ultrasound and fluoroscopic guidance. The catheter is ready for use.  Electronically Signed   By: Jacqulynn Cadet  M.D.   On: 04/29/2019 16:55   [place] EKG SITE RITE  Result Date: 04/28/2019 If Site Rite image not attached, placement could not be confirmed due to current cardiac rhythm.    Antimicrobials:   P.o. vancomycin started December 26   Subjective: No acute events overnight, Reported only one diarrheal episode  Objective: Vitals:   05/18/19 2134 05/19/19 0822 05/19/19 0900 05/19/19 1134  BP: 91/79 (!) 77/63 (!) 81/55 109/70  Pulse: 71 (!) 102  (!) 103  Resp: 15     Temp: 100.3 F (37.9 C) 98.7 F (37.1 C)  98.7 F (37.1 C)  TempSrc: Oral Oral  Oral  SpO2: 98% 100%  99%  Weight:      Height:        Intake/Output Summary (Last 24 hours) at 05/19/2019 1443 Last data filed at 05/19/2019 1200 Gross per 24 hour  Intake 1300 ml  Output 1625 ml  Net -325 ml   Filed Weights   05/11/19 0506 05/11/19 0649  Weight: 83 kg 82.2 kg    Examination:  General exam: Appears calm and comfortable  Respiratory system: Rhonchi bilaterally, no accessory muscle use no wheezing Cardiovascular system: S1 & S2 heard, RRR. No JVD, murmurs, rubs, gallops or clicks. No pedal edema. Gastrointestinal system: Abdomen is nondistended, soft and nontender. No organomegaly or masses felt. Normal bowel sounds heard. Central nervous system: Alert and oriented.  Tractors Extremities: Warm well perfused contractures as noted above Skin: No rashes, lesions or ulcers Psychiatry: Judgement and insight appear impaired. Mood & affect flat.     Data Reviewed: I have personally reviewed following labs and imaging studies  CBC: Recent Labs  Lab 05/13/19 1502 05/14/19 0753 05/17/19 0752 05/18/19 0911  WBC 5.9 6.8 9.6 11.2*  NEUTROABS  --   --   --  9.5*  HGB 7.7* 7.8* 8.3* 8.3*  HCT 25.2* 25.1* 26.9* 27.4*  MCV 85.4 84.8 83.3 84.0  PLT 363 363 347 [id number]   Basic Metabolic Panel: Recent Labs  Lab 05/14/19 0753 05/15/19 0819 05/16/19 0608 05/17/19 0752  05/18/19 0911  NA 140 138 138 138 135  K 2.6* 4.1 3.5 3.5 3.6  CL 107 107 105 104 101  CO2 24 24 23 25 25   GLUCOSE 85 85 98 72 85  BUN <5* <5* 5* <5* 7  CREATININE <0.30* <0.30* 0.52* <0.30* <0.30*  CALCIUM 7.7* 7.8* 8.1* 7.8* 7.8*  MG  --  1.3* 1.6* 1.6*  --    GFR: CrCl cannot be calculated (This lab value cannot be used to calculate CrCl because it is not a number: <0.30). Liver Function Tests: No results for input(s): AST, ALT, ALKPHOS, BILITOT, PROT, ALBUMIN in the last 168 hours. No results for input(s): LIPASE, AMYLASE in the last 168 hours. No results for input(s): AMMONIA in the last 168 hours. Coagulation Profile: No results for input(s): INR, PROTIME in the last 168 hours. Cardiac Enzymes: No results for input(s): CKTOTAL, CKMB, CKMBINDEX, TROPONINI in the last 168 hours. BNP (last 3 results) No results for input(s): PROBNP in the last 8760 hours. HbA1C: No results for input(s): HGBA1C in the last 72 hours. CBG: No results for input(s): GLUCAP in the last 168 hours. Lipid Profile: No results for input(s): CHOL, HDL, LDLCALC, TRIG, CHOLHDL, LDLDIRECT in the last 72 hours. Thyroid Function Tests: No results for input(s): TSH, T4TOTAL, FREET4, T3FREE, THYROIDAB in the last 72 hours. Anemia Panel: No results for input(s): VITAMINB12, FOLATE, FERRITIN, TIBC, IRON, RETICCTPCT in the last 72  hours. Sepsis Labs: No results for input(s): PROCALCITON, LATICACIDVEN in the last 168 hours.  Recent Results (from the past 240 hour(s))  Blood Culture (routine x 2)     Status: None   Collection Time: 05/10/19 10:35 PM   Specimen: BLOOD  Result Value Ref Range Status   Specimen Description BLOOD LEFT ANTECUBITAL  Final   Special Requests   Final    BOTTLES DRAWN AEROBIC AND ANAEROBIC Blood Culture adequate volume   Culture   Final    NO GROWTH 5 DAYS Performed at [facility], [address], [place], [place] [address]    Report Status 05/15/2019 FINAL  Final    Respiratory Panel by RT PCR (Flu A&B, Covid) - Nasopharyngeal Swab     Status: None   Collection Time: 05/11/19  1:30 AM   Specimen: Nasopharyngeal Swab  Result Value Ref Range Status   SARS Coronavirus 2 by RT PCR NEGATIVE NEGATIVE Final    Comment: (NOTE) SARS-CoV-2 target nucleic acids are NOT DETECTED. The SARS-CoV-2 RNA is generally detectable in upper respiratoy specimens during the acute phase of infection. The lowest concentration of SARS-CoV-2 viral copies this assay can detect is 131 copies/mL. A negative result does not preclude SARS-Cov-2 infection and should not be used as the sole basis for treatment or other patient management decisions. A negative result may occur with  improper specimen collection/handling, submission of specimen other than nasopharyngeal swab, presence of viral mutation(s) within the areas targeted by this assay, and inadequate number of viral copies (<131 copies/mL). A negative result must be combined with clinical observations, patient history, and epidemiological information. The expected result is Negative. Fact Sheet for Patients:  PinkCheek.be Fact Sheet for Healthcare Providers:  GravelBags.it This test is not yet ap proved or cleared by the [place] FDA and  has been authorized for detection and/or diagnosis of SARS-CoV-2 by FDA under an Emergency Use Authorization (EUA). This EUA will remain  in effect (meaning this test can be used) for the duration of the COVID-19 declaration under Section 564(b)(1) of the Act, 21 U.S.C. section 360bbb-3(b)(1), unless the authorization is terminated or revoked sooner.    Influenza A by PCR NEGATIVE NEGATIVE Final   Influenza B by PCR NEGATIVE NEGATIVE Final    Comment: (NOTE) The Xpert Xpress SARS-CoV-2/FLU/RSV assay is intended as an aid in  the diagnosis of influenza from Nasopharyngeal swab specimens and  should not be used as a sole  basis for treatment. Nasal washings and  aspirates are unacceptable for Xpert Xpress SARS-CoV-2/FLU/RSV  testing. Fact Sheet for Patients: PinkCheek.be Fact Sheet for Healthcare Providers: GravelBags.it This test is not yet approved or cleared by the [place] FDA and  has been authorized for detection and/or diagnosis of SARS-CoV-2 by  FDA under an Emergency Use Authorization (EUA). This EUA will remain  in effect (meaning this test can be used) for the duration of the  Covid-19 declaration under Section 564(b)(1) of the Act, 21  U.S.C. section 360bbb-3(b)(1), unless the authorization is  terminated or revoked. Performed at [facility], [place] [address], [place], [place] [address]   MRSA PCR Screening     Status: Abnormal   Collection Time: 05/11/19  8:48 AM   Specimen: Nasal Mucosa; Nasopharyngeal  Result Value Ref Range Status   MRSA by PCR POSITIVE (A) NEGATIVE Final    Comment:        The GeneXpert MRSA Assay (FDA approved for NASAL specimens only), is one component  of a comprehensive MRSA colonization surveillance program. It is not intended to diagnose MRSA infection nor to guide or monitor treatment for MRSA infections. RESULT CALLED TO, READ BACK BY AND VERIFIED WITH: D.MUHORO RN AT 1134 05/11/2019 BY A.DAVIS Performed at [facility], [address] [place], [place] [address]   Urine culture     Status: Abnormal   Collection Time: 05/11/19 10:11 AM   Specimen: In/Out Cath Urine  Result Value Ref Range Status   Specimen Description IN/OUT CATH URINE  Final   Special Requests   Final    NONE Performed at [facility], [place] [address] [place], [place] [address]    Culture MULTIPLE SPECIES PRESENT, SUGGEST RECOLLECTION (A)  Final   Report Status 05/14/2019 FINAL  Final  C difficile quick scan w PCR reflex     Status: Abnormal   Collection Time: 05/11/19  2:14 PM   Specimen: STOOL    Result Value Ref Range Status   C Diff antigen POSITIVE (A) NEGATIVE Final   C Diff toxin NEGATIVE NEGATIVE Final   C Diff interpretation Results are indeterminate. See PCR results.  Final    Comment: Performed at [facility], [place] [address] [place], [place] [address]  C. Diff by PCR, Reflexed     Status: Abnormal   Collection Time: 05/11/19  3:57 PM  Result Value Ref Range Status   Toxigenic C. Difficile by PCR POSITIVE (A) NEGATIVE Final    Comment: Positive for toxigenic C. difficile with little to no toxin production. Only treat if clinical presentation suggests symptomatic illness. Performed at [facility], [place] [address] [place], [place] [address]   Aerobic Culture (superficial specimen)     Status: Abnormal   Collection Time: 05/12/19  1:22 PM   Specimen: Wound  Result Value Ref Range Status   Specimen Description WOUND  Final   Special Requests NONE  Final   Gram Stain   Final    NO WBC SEEN RARE GRAM POSITIVE COCCI IN PAIRS FEW GRAM POSITIVE RODS Performed at [facility], [address] [place], [place] [address]    Culture MULTIPLE ORGANISMS PRESENT, NONE PREDOMINANT (A)  Final   Report Status 05/14/2019 FINAL  Final  SARS CORONAVIRUS 2 (TAT 6-24 HRS) Nasopharyngeal Nasopharyngeal Swab     Status: None   Collection Time: 05/16/19  9:09 AM   Specimen: Nasopharyngeal Swab  Result Value Ref Range Status   SARS Coronavirus 2 NEGATIVE NEGATIVE Final    Comment: (NOTE) SARS-CoV-2 target nucleic acids are NOT DETECTED. The SARS-CoV-2 RNA is generally detectable in upper and lower respiratory specimens during the acute phase of infection. Negative results do not preclude SARS-CoV-2 infection, do not rule out co-infections with other pathogens, and should not be used as the sole basis for treatment or other patient management decisions. Negative results must be combined with clinical observations, patient history, and epidemiological information. The  expected result is Negative. Fact Sheet for Patients: SugarRoll.be Fact Sheet for Healthcare Providers: https://www.woods-mathews.com/ This test is not yet approved or cleared by the [place] FDA and  has been authorized for detection and/or diagnosis of SARS-CoV-2 by FDA under an Emergency Use Authorization (EUA). This EUA will remain  in effect (meaning this test can be used) for the duration of the COVID-19 declaration under Section 56 4(b)(1) of the Act, 21 U.S.C. section 360bbb-3(b)(1), unless the authorization is terminated or revoked sooner. Performed at [facility],  [address] [place], [place] [address]   Urine Culture     Status: Abnormal   Collection Time: 05/18/19 12:01 PM   Specimen: Urine, Random  Result Value Ref Range Status   Specimen Description URINE, RANDOM  Final   Special Requests   Final    NONE Performed at [facility], [place] [address], [place], [place] [address]    Culture MULTIPLE SPECIES PRESENT, SUGGEST RECOLLECTION (A)  Final   Report Status 05/19/2019 FINAL  Final  Culture, blood (routine x 2)     Status: None (Preliminary result)   Collection Time: 05/18/19  8:47 PM   Specimen: BLOOD  Result Value Ref Range Status   Specimen Description BLOOD LEFT HAND  Final   Special Requests   Final    BOTTLES DRAWN AEROBIC ONLY Blood Culture results may not be optimal due to an inadequate volume of blood received in culture bottles   Culture   Final    NO GROWTH < 12 HOURS Performed at [facility], [place] [address], [place], [place] [address]    Report Status PENDING  Incomplete  Culture, blood (routine x 2)     Status: None (Preliminary result)   Collection Time: 05/18/19  8:55 PM   Specimen: BLOOD  Result Value Ref Range Status   Specimen Description BLOOD LEFT THUMB  Final   Special Requests   Final    BOTTLES DRAWN AEROBIC ONLY Blood Culture adequate volume   Culture   Final    NO  GROWTH < 12 HOURS Performed at [facility], [place] [address] [place], [place] [address]    Report Status PENDING  Incomplete         Radiology Studies: DG CHEST PORT 1 VIEW  Result Date: 05/18/2019 CLINICAL DATA:  Update status fever EXAM: PORTABLE CHEST 1 VIEW COMPARISON:  Chest radiograph 05/10/2019 FINDINGS: Stable cardiomediastinal contours with enlarged heart size. Increased consolidation at the right lung base with small to moderate volume right pleural fluid. The left lung is clear. No pneumothorax. IMPRESSION: Increased consolidation at the right lung base with small to moderate volume right pleural fluid. Electronically Signed   By: Audie Pinto M.D.   On: 05/18/2019 11:45        Scheduled Meds: . baclofen  20 mg Oral BID  . Chlorhexidine Gluconate Cloth  6 each Topical Daily  . enoxaparin (LOVENOX) injection  40 mg Subcutaneous Q24H  . feeding supplement (PRO-STAT SUGAR FREE 64)  30 mL Oral BID  . ferrous sulfate  325 mg Oral Q breakfast  . fesoterodine  8 mg Oral Daily  . metoCLOPramide  10 mg Oral TID WC  . midodrine  10 mg Oral TID WC  . multivitamin with minerals  1 tablet Oral Daily  . nutrition supplement (JUVEN)  1 packet Oral BID BM  . pantoprazole  40 mg Oral BID  . Ensure Max Protein  11 oz Oral Daily  . sodium chloride flush  10-40 mL Intracatheter Q12H  . sucralfate  1 g Oral QID  . vancomycin  125 mg Oral QID   Continuous Infusions:   LOS: 8 days    Time spent: 35 min    Nicolette Bang, MD [facility]  If 7PM-7AM, please contact night-coverage  05/19/2019, 2:43 PM

## 2019-05-19 NOTE — Progress Notes (Signed)
Patient arrived to unit by bed swat nurse to transport. Patient arrived with mask in place. States he felt some nausea from transport. States he does not want to be turned to his side for nurse to inspect because he is sick and he just ate. " Can you wait a couple hours" oriented to rm and staff and gave patient call bell.Suprapubic to gravity. Patient has upper and lower ext. Contractions, has bilateral lower ext. Protective boots. In place. HOB elevated

## 2019-05-19 NOTE — TOC Progression Note (Signed)
Transition of Care [facility]) - Progression Note    Patient Details  Name: [name] [name] MRN: [id number] Date of Birth: May 15, 1966  Transition of Care [facility]) CM/SW [place], [place] Phone Number: [phone] 05/19/2019, 2:42 PM  Clinical Narrative:    CSW informed patient of bed offer from [facility]. Patient inquired about facility's address and CSW provided him with the information. CSW informed patient that he currently that was the only bed offer and was he in agreement with [facility].   CSW spoke with Ebony Hail at [facility] and she stated that they had a bed for patient however he would need an updated COVID test. CSW alerted RN of the request.   TOC team will continue to assist with discharge planning needs.  Expected Discharge Plan: Skilled Nursing Facility Barriers to Discharge: Insurance Authorization  Expected Discharge Plan and Services Expected Discharge Plan: [place] Acute Care Choice: [place] Living arrangements for the past 2 months: Single Family Home                                       Social Determinants of Health (SDOH) Interventions    Readmission Risk Interventions Readmission Risk Prevention Plan 04/29/2019 04/01/2019 03/27/2019  Transportation Screening Complete Complete Complete  PCP or Specialist Appt within 5-7 Days - - -  PCP or Specialist Appt within 3-5 Days - - -  Not Complete comments - - -  Home Care Screening - - -  Medication Review (RN CM) - - -  HRI or [place] Work Consult for [place] Planning/Counseling - - -  [place] - - -  Medication Review ([place]) Complete Referral to Pharmacy Complete  PCP or Specialist appointment within 3-5 days of discharge Complete Not Complete Complete  PCP/Specialist Appt Not Complete comments - DC date unknown -  HRI or Home Care Consult Complete Complete Complete  SW Recovery  Care/Counseling Consult Complete - Complete  Palliative Care Screening Not Applicable Not Complete Not Applicable  Comments - no desire -  Skilled Nursing Facility Complete Not Applicable Not Applicable  Some recent data might be hidden

## 2019-05-20 ENCOUNTER — Inpatient Hospital Stay (HOSPITAL_COMMUNITY): Payer: Medicare Other

## 2019-05-20 LAB — CBC WITH DIFFERENTIAL/PLATELET
Abs Immature Granulocytes: 0 10*3/uL (ref 0.00–0.07)
Basophils Absolute: 0.1 10*3/uL (ref 0.0–0.1)
Basophils Relative: 1 %
Eosinophils Absolute: 0 10*3/uL (ref 0.0–0.5)
Eosinophils Relative: 0 %
HCT: 27.6 % — ABNORMAL LOW (ref 39.0–52.0)
Hemoglobin: 8.7 g/dL — ABNORMAL LOW (ref 13.0–17.0)
Lymphocytes Relative: 7 %
Lymphs Abs: 0.7 10*3/uL (ref 0.7–4.0)
MCH: 25.6 pg — ABNORMAL LOW (ref 26.0–34.0)
MCHC: 31.5 g/dL (ref 30.0–36.0)
MCV: 81.2 fL (ref 80.0–100.0)
Monocytes Absolute: 0.2 10*3/uL (ref 0.1–1.0)
Monocytes Relative: 2 %
Neutro Abs: 9.2 10*3/uL — ABNORMAL HIGH (ref 1.7–7.7)
Neutrophils Relative %: 90 %
Platelets: 236 10*3/uL (ref 150–400)
RBC: 3.4 MIL/uL — ABNORMAL LOW (ref 4.22–5.81)
RDW: 16.4 % — ABNORMAL HIGH (ref 11.5–15.5)
WBC: 10.2 10*3/uL (ref 4.0–10.5)
nRBC: 0 % (ref 0.0–0.2)
nRBC: 0 /100 WBC

## 2019-05-20 LAB — BASIC METABOLIC PANEL
Anion gap: 11 (ref 5–15)
BUN: 10 mg/dL (ref 6–20)
CO2: 20 mmol/L — ABNORMAL LOW (ref 22–32)
Calcium: 7.7 mg/dL — ABNORMAL LOW (ref 8.9–10.3)
Chloride: 105 mmol/L (ref 98–111)
Creatinine, Ser: <0.3 mg/dL — ABNORMAL LOW (ref 0.61–1.24)
Glucose, Bld: 93 mg/dL (ref 70–99)
Potassium: 4.1 mmol/L (ref 3.5–5.1)
Sodium: 136 mmol/L (ref 135–145)

## 2019-05-20 NOTE — Progress Notes (Signed)
PROGRESS NOTE    [name] [name]  [id number] DOB: Nov 19, 1965 DOA: 05/10/2019 PCP: [name] Frames, PA-C   Brief Narrative: Per HPI 54 year old African-American male with history of quadriplegia, chronic decubitus ulcers, recurrent UTI, colostomy and chronic suprapubic catheter, chronic hypotension on midodrine admitted with chills, nausea, vomiting and diarrhea.Stool tested positive for C. difficile and on oral vancomycin.He was started on cefepime due to concern for UTI, which has since been discontinued due to no organism shown on urine culture.   1/2-fever overnight 101.1 x 1. Fever again 1/3 night 102.8  Subjective:  [name], RESTING ON RA Overnight the next 102.8 at 8 PM last night. blood pressure intermittently soft earlier was 82/65 this morning [id number] systolic CBC trending up [id number], hemoglobin 8.4 gm. UA 1/3 shows WBC more than 50, bacteria many, leukocyte large Chemistry panel pending. SP catheter w/ clear urine, colostomy with semi solid-liquidy stool- brown-greenish  Assessment & Plan:   Sepsis secondary to C. Difficile: Stool becoming more formed.  Continue on oral vancomycin start date 12/26. Complete course.  Noticed intermittent fever.Blood cultures negative  Fever: 1/2 101.1 x 1. Fever again 1/3 night 102.8. CXR 1/2: "Increased consolidation at the right lung base with small to moderate volume right pleural fluid".  Repeat UA from 1/3 abnormal.  Ordered CT chest abdomen pelvis no acute significant finding-see report below.  WBC count slightly of 12 point 6K.  Repeat CBC in the a.m.   Bacteriuria:PreviousUrine culture revealed multiple organisms presentsuspect chronic colonization w/ chronic suprapubic tube. Off cefepime-follow-up urine culture.    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 Cont supportive measures, offloading, continue home baclofen, Reglan  Chronic hypotension: On midodrine.  Hypomagnesemia: Was replaced.  Anemia of chronic disease: Hemoglobin is stable.  Monitor Recent Labs  Lab 05/14/19 0753 05/17/19 0752 05/18/19 0911 05/19/19 1429  HGB 7.8* 8.3* 8.3* 8.4*  HCT 25.1* 26.9* 27.4* 27.6*   Body mass index is 24.58 kg/m.    DVT prophylaxis: lovenox Code Status:  Family Communication: plan of care discussed with patient at bedside. Disposition Plan: Remains inpatient due to fever, hopefully skilled nursing facility next 1-2 days if remains fever free.Will order COVID-19 for screening.    Consultants: Palliative care Procedures: Chest abdomen and pelvis 1/4  1. No acute findings or explanation for the patient's symptoms. 2. Improved bilateral pleural effusions with persistent bilateral lower lobe airspace opacities suggesting chronic aspiration. These findings have mildly improved compared with the most recent chest CT of 2 weeks ago. 3. No acute intra-abdominal findings. No evidence of colitis or bowel obstruction. 4. Extensive decubitus ulcer formation along the posterior aspect of the lower right chest wall and both buttocks, right greater than left. 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  Blood culture from 1/2 neg so far. C. difficile PCR positive for antigen negative for toxin  Antimicrobials: Anti-infectives (From admission, onward)   Start     Dose/Rate Route Frequency Ordered Stop   05/12/19 2200  ceFEPIme (MAXIPIME) 2 g in sodium chloride 0.9 % 100 mL IVPB  Status:  Discontinued     2 g 200 mL/hr over  30 Minutes Intravenous Every 12 hours  05/12/19 1440 05/14/19 1014   05/11/19 1930  vancomycin (VANCOCIN) 50 mg/mL oral solution 125 mg     125 mg Oral 4 times daily 05/11/19 1916 05/21/19 1759   05/11/19 1000  ceFEPIme (MAXIPIME) 2 g in sodium chloride 0.9 % 100 mL IVPB  Status:  Discontinued     2 g 200 mL/hr over 30 Minutes Intravenous Every 8 hours 05/11/19 0245 05/12/19 1440   05/11/19 0245  ceFEPIme (MAXIPIME) 2 g in sodium chloride 0.9 % 100 mL IVPB     2 g 200 mL/hr over 30 Minutes Intravenous  Once 05/11/19 0232 05/11/19 0419   05/10/19 2330  meropenem (MERREM) 1 g in sodium chloride 0.9 % 100 mL IVPB     1 g 200 mL/hr over 30 Minutes Intravenous  Once 05/10/19 2319 05/11/19 0121   05/10/19 2330  linezolid (ZYVOX) IVPB 600 mg  Status:  Discontinued     600 mg 300 mL/hr over 60 Minutes Intravenous Every 12 hours 05/10/19 2319 05/10/19 2331   05/10/19 2330  linezolid (ZYVOX) IVPB 600 mg     600 mg 300 mL/hr over 60 Minutes Intravenous  Once 05/10/19 2322 05/11/19 0226       Objective: Vitals:   05/19/19 2058 05/19/19 2125 05/19/19 2207 05/20/19 0353  BP: 103/71 94/72 (!) 82/65 129/88  Pulse: (!) 109 (!) 109 (!) 104 97  Resp:      Temp: 100 F (37.8 C) 99.2 F (37.3 C) 99.7 F (37.6 C) 98.6 F (37 C)  TempSrc: Oral Oral Oral Oral  SpO2: 96% 97% 97% 98%  Weight:      Height:        Intake/Output Summary (Last 24 hours) at 05/20/2019 0846 Last data filed at 05/20/2019 0457 Gross per 24 hour  Intake 450 ml  Output 425 ml  Net 25 ml   Filed Weights   05/11/19 0506 05/11/19 0649  Weight: 83 kg 82.2 kg   Weight change:   Body mass index is 24.58 kg/m.  Intake/Output from previous day: 01/03 0701 - 01/04 0700 In: 930 [P.O.:920; I.V.:10] Out: 775 [Urine:225; Stool:550] Intake/Output this shift: No intake/output data recorded.  Examination:  General exam: AAOx3, RA,NAD, Weak appearing. HEENT:Oral mucosa moist, Ear/Nose WNL grossly, dentition normal. Respiratory system: Diminished at the base,no  wheezing or crackles,no use of accessory muscle Cardiovascular system: S1 & S2 +, No JVD,. Gastrointestinal system: Abdomen soft, NT,ND, BS+ Nervous System:Alert, awake, moving extremities and grossly nonfocal Extremities: ankle edema+, contractures + on UE/LE, Skin: No rashes,no icterus. Multiple areas of pressure ulcers in extremities MSK: thin muscle bulk,tone, power SP catheter + W/ CLEAR URINE  Medications:  Scheduled Meds: . baclofen  20 mg Oral BID  . Chlorhexidine Gluconate Cloth  6 each Topical Daily  . enoxaparin (LOVENOX) injection  40 mg Subcutaneous Q24H  . feeding supplement (PRO-STAT SUGAR FREE 64)  30 mL Oral BID  . ferrous sulfate  325 mg Oral Q breakfast  . fesoterodine  8 mg Oral Daily  . metoCLOPramide  10 mg Oral TID WC  . midodrine  10 mg Oral TID WC  . multivitamin with minerals  1 tablet Oral Daily  . nutrition supplement (JUVEN)  1 packet Oral BID BM  . pantoprazole  40 mg Oral BID  . Ensure Max Protein  11 oz Oral Daily  . sodium chloride flush  10-40 mL Intracatheter Q12H  . sucralfate  1 g Oral QID  . vancomycin  125 mg Oral QID   Continuous Infusions:  Data Reviewed: I have personally reviewed following labs and imaging studies  CBC: Recent Labs  Lab 05/13/19 1502 05/14/19 0753 05/17/19 0752 05/18/19 0911 05/19/19 1429  WBC 5.9 6.8 9.6 11.2* 12.6*  NEUTROABS  --   --   --  9.5* 10.7*  HGB 7.7* 7.8* 8.3* 8.3* 8.4*  HCT 25.2* 25.1* 26.9* 27.4* 27.6*  MCV 85.4 84.8 83.3 84.0 83.6  PLT 363 363 347 212 [id number]   Basic Metabolic Panel: Recent Labs  Lab 05/14/19 0753 05/15/19 0819 05/16/19 0608 05/17/19 0752 05/18/19 0911  NA 140 138 138 138 135  K 2.6* 4.1 3.5 3.5 3.6  CL 107 107 105 104 101  CO2 24 24 23 25 25   GLUCOSE 85 85 98 72 85  BUN <5* <5* 5* <5* 7  CREATININE <0.30* <0.30* 0.52* <0.30* <0.30*  CALCIUM 7.7* 7.8* 8.1* 7.8* 7.8*  MG  --  1.3* 1.6* 1.6*  --    GFR: CrCl cannot be calculated (This lab value cannot be used to  calculate CrCl because it is not a number: <0.30). Liver Function Tests: No results for input(s): AST, ALT, ALKPHOS, BILITOT, PROT, ALBUMIN in the last 168 hours. No results for input(s): LIPASE, AMYLASE in the last 168 hours. No results for input(s): AMMONIA in the last 168 hours. Coagulation Profile: No results for input(s): INR, PROTIME in the last 168 hours. Cardiac Enzymes: No results for input(s): CKTOTAL, CKMB, CKMBINDEX, TROPONINI in the last 168 hours. BNP (last 3 results) No results for input(s): PROBNP in the last 8760 hours. HbA1C: No results for input(s): HGBA1C in the last 72 hours. CBG: No results for input(s): GLUCAP in the last 168 hours. Lipid Profile: No results for input(s): CHOL, HDL, LDLCALC, TRIG, CHOLHDL, LDLDIRECT in the last 72 hours. Thyroid Function Tests: No results for input(s): TSH, T4TOTAL, FREET4, T3FREE, THYROIDAB in the last 72 hours. Anemia Panel: No results for input(s): VITAMINB12, FOLATE, FERRITIN, TIBC, IRON, RETICCTPCT in the last 72 hours. Sepsis Labs: No results for input(s): PROCALCITON, LATICACIDVEN in the last 168 hours.  Recent Results (from the past 240 hour(s))  Blood Culture (routine x 2)     Status: None   Collection Time: 05/10/19 10:35 PM   Specimen: BLOOD  Result Value Ref Range Status   Specimen Description BLOOD LEFT ANTECUBITAL  Final   Special Requests   Final    BOTTLES DRAWN AEROBIC AND ANAEROBIC Blood Culture adequate volume   Culture   Final    NO GROWTH 5 DAYS Performed at [facility], [address] [place], [place] [address]    Report Status 05/15/2019 FINAL  Final  Respiratory Panel by RT PCR (Flu A&B, Covid) - Nasopharyngeal Swab     Status: None   Collection Time: 05/11/19  1:30 AM   Specimen: Nasopharyngeal Swab  Result Value Ref Range Status   SARS Coronavirus 2 by RT PCR NEGATIVE NEGATIVE Final    Comment: (NOTE) SARS-CoV-2 target nucleic acids are NOT DETECTED. The SARS-CoV-2 RNA is generally  detectable in upper respiratoy specimens during the acute phase of infection. The lowest concentration of SARS-CoV-2 viral copies this assay can detect is 131 copies/mL. A negative result does not preclude SARS-Cov-2 infection and should not be used as the sole basis for treatment or other patient management decisions. A negative result may occur with  improper specimen collection/handling, submission of specimen other than nasopharyngeal swab, presence of viral mutation(s) within the areas  targeted by this assay, and inadequate number of viral copies (<131 copies/mL). A negative result must be combined with clinical observations, patient history, and epidemiological information. The expected result is Negative. Fact Sheet for Patients:  PinkCheek.be Fact Sheet for Healthcare Providers:  GravelBags.it This test is not yet ap proved or cleared by the [place] FDA and  has been authorized for detection and/or diagnosis of SARS-CoV-2 by FDA under an Emergency Use Authorization (EUA). This EUA will remain  in effect (meaning this test can be used) for the duration of the COVID-19 declaration under Section 564(b)(1) of the Act, 21 U.S.C. section 360bbb-3(b)(1), unless the authorization is terminated or revoked sooner.    Influenza A by PCR NEGATIVE NEGATIVE Final   Influenza B by PCR NEGATIVE NEGATIVE Final    Comment: (NOTE) The Xpert Xpress SARS-CoV-2/FLU/RSV assay is intended as an aid in  the diagnosis of influenza from Nasopharyngeal swab specimens and  should not be used as a sole basis for treatment. Nasal washings and  aspirates are unacceptable for Xpert Xpress SARS-CoV-2/FLU/RSV  testing. Fact Sheet for Patients: PinkCheek.be Fact Sheet for Healthcare Providers: GravelBags.it This test is not yet approved or cleared by the [place] FDA and  has been  authorized for detection and/or diagnosis of SARS-CoV-2 by  FDA under an Emergency Use Authorization (EUA). This EUA will remain  in effect (meaning this test can be used) for the duration of the  Covid-19 declaration under Section 564(b)(1) of the Act, 21  U.S.C. section 360bbb-3(b)(1), unless the authorization is  terminated or revoked. Performed at [facility], [place] [address] [place], [place] [address]   MRSA PCR Screening     Status: Abnormal   Collection Time: 05/11/19  8:48 AM   Specimen: Nasal Mucosa; Nasopharyngeal  Result Value Ref Range Status   MRSA by PCR POSITIVE (A) NEGATIVE Final    Comment:        The GeneXpert MRSA Assay (FDA approved for NASAL specimens only), is one component of a comprehensive MRSA colonization surveillance program. It is not intended to diagnose MRSA infection nor to guide or monitor treatment for MRSA infections. RESULT CALLED TO, READ BACK BY AND VERIFIED WITH: D.MUHORO RN AT 1134 05/11/2019 BY A.DAVIS Performed at [facility], [address] [place], [place] [address]   Urine culture     Status: Abnormal   Collection Time: 05/11/19 10:11 AM   Specimen: In/Out Cath Urine  Result Value Ref Range Status   Specimen Description IN/OUT CATH URINE  Final   Special Requests   Final    NONE Performed at [facility], [place] [address] [place],  [address]    Culture MULTIPLE SPECIES PRESENT, SUGGEST RECOLLECTION (A)  Final   Report Status 05/14/2019 FINAL  Final  C difficile quick scan w PCR reflex     Status: Abnormal   Collection Time: 05/11/19  2:14 PM   Specimen: STOOL  Result Value Ref Range Status   C Diff antigen POSITIVE (A) NEGATIVE Final   C Diff toxin NEGATIVE NEGATIVE Final   C Diff interpretation Results are indeterminate. See PCR results.  Final    Comment: Performed at [facility], [place] [address] [place],  [address]  C. Diff by PCR, Reflexed     Status: Abnormal   Collection Time:  05/11/19  3:57 PM  Result Value Ref Range Status   Toxigenic C. Difficile by PCR POSITIVE (A) NEGATIVE Final  Comment: Positive for toxigenic C. difficile with little to no toxin production. Only treat if clinical presentation suggests symptomatic illness. Performed at [facility], [place] [address], [place], [place] [address]   Aerobic Culture (superficial specimen)     Status: Abnormal   Collection Time: 05/12/19  1:22 PM   Specimen: Wound  Result Value Ref Range Status   Specimen Description WOUND  Final   Special Requests NONE  Final   Gram Stain   Final    NO WBC SEEN RARE GRAM POSITIVE COCCI IN PAIRS FEW GRAM POSITIVE RODS Performed at [facility], [address], [place], [place] [address]    Culture MULTIPLE ORGANISMS PRESENT, NONE PREDOMINANT (A)  Final   Report Status 05/14/2019 FINAL  Final  SARS CORONAVIRUS 2 (TAT 6-24 HRS) Nasopharyngeal Nasopharyngeal Swab     Status: None   Collection Time: 05/16/19  9:09 AM   Specimen: Nasopharyngeal Swab  Result Value Ref Range Status   SARS Coronavirus 2 NEGATIVE NEGATIVE Final    Comment: (NOTE) SARS-CoV-2 target nucleic acids are NOT DETECTED. The SARS-CoV-2 RNA is generally detectable in upper and lower respiratory specimens during the acute phase of infection. Negative results do not preclude SARS-CoV-2 infection, do not rule out co-infections with other pathogens, and should not be used as the sole basis for treatment or other patient management decisions. Negative results must be combined with clinical observations, patient history, and epidemiological information. The expected result is Negative. Fact Sheet for Patients: SugarRoll.be Fact Sheet for Healthcare Providers: https://www.woods-mathews.com/ This test is not yet approved or cleared by the [place] FDA and  has been authorized for detection and/or diagnosis of SARS-CoV-2 by FDA under an Emergency Use  Authorization (EUA). This EUA will remain  in effect (meaning this test can be used) for the duration of the COVID-19 declaration under Section 56 4(b)(1) of the Act, 21 U.S.C. section 360bbb-3(b)(1), unless the authorization is terminated or revoked sooner. Performed at [facility], [place] [address] [place], [place] [address]   Urine Culture     Status: Abnormal   Collection Time: 05/18/19 12:01 PM   Specimen: Urine, Random  Result Value Ref Range Status   Specimen Description URINE, RANDOM  Final   Special Requests   Final    NONE Performed at [facility], [place] [address] [place], [place] [address]    Culture MULTIPLE SPECIES PRESENT, SUGGEST RECOLLECTION (A)  Final   Report Status 05/19/2019 FINAL  Final  Culture, blood (routine x 2)     Status: None (Preliminary result)   Collection Time: 05/18/19  8:47 PM   Specimen: BLOOD  Result Value Ref Range Status   Specimen Description BLOOD LEFT HAND  Final   Special Requests   Final    BOTTLES DRAWN AEROBIC ONLY Blood Culture results may not be optimal due to an inadequate volume of blood received in culture bottles   Culture   Final    NO GROWTH < 12 HOURS Performed at [facility], [place] [address] [place], [place] [address]    Report Status PENDING  Incomplete  Culture, blood (routine x 2)     Status: None (Preliminary result)   Collection Time: 05/18/19  8:55 PM   Specimen: BLOOD  Result Value Ref Range Status   Specimen Description BLOOD LEFT THUMB  Final   Special Requests   Final    BOTTLES DRAWN AEROBIC ONLY Blood Culture adequate volume  Culture   Final    NO GROWTH < 12 HOURS Performed at [facility], [place] [address] [place], [place] [address]    Report Status PENDING  Incomplete      Radiology Studies: DG CHEST PORT 1 VIEW  Result Date: 05/18/2019 CLINICAL DATA:  Update status fever EXAM: PORTABLE CHEST 1 VIEW COMPARISON:  Chest radiograph 05/10/2019 FINDINGS: Stable cardiomediastinal  contours with enlarged heart size. Increased consolidation at the right lung base with small to moderate volume right pleural fluid. The left lung is clear. No pneumothorax. IMPRESSION: Increased consolidation at the right lung base with small to moderate volume right pleural fluid. Electronically Signed   By: Audie Pinto M.D.   On: 05/18/2019 11:45      LOS: 9 days   Time spent: More than 50% of that time was spent in counseling and/or coordination of care.  Antonieta Pert, MD [facility]  05/20/2019, 8:46 AM

## 2019-05-20 NOTE — Progress Notes (Addendum)
   05/20/19 1229  SNF Authorization Status  SNF Authorization Type Transition of Care CM/SW Authorization Request  SNF Authorization Complete 05/20/19  SNF Auth Complete Time 1229  SNF Authorization Status Complete   CM spoke to [name] [employer] rep) insurance auth received for [facility] with a state date till 05/22/19. Request sent to Dr. Maren Beach for an updated [place] MSN, RN, NCM-BC, ACM-RN [phone]

## 2019-05-20 NOTE — Progress Notes (Signed)
IV team will return to do blood work.  They came this morning but staff was doing extensive dressing changes.

## 2019-05-20 NOTE — Plan of Care (Signed)

## 2019-05-21 LAB — RESPIRATORY PANEL BY RT PCR (FLU A&B, COVID)
Influenza A by PCR: NEGATIVE
Influenza B by PCR: NEGATIVE
SARS Coronavirus 2 by RT PCR: NEGATIVE

## 2019-05-21 LAB — CBC
HCT: 26.2 % — ABNORMAL LOW (ref 39.0–52.0)
Hemoglobin: 8 g/dL — ABNORMAL LOW (ref 13.0–17.0)
MCH: 25.6 pg — ABNORMAL LOW (ref 26.0–34.0)
MCHC: 30.5 g/dL (ref 30.0–36.0)
MCV: 84 fL (ref 80.0–100.0)
Platelets: 411 10*3/uL — ABNORMAL HIGH (ref 150–400)
RBC: 3.12 MIL/uL — ABNORMAL LOW (ref 4.22–5.81)
RDW: 16.6 % — ABNORMAL HIGH (ref 11.5–15.5)
WBC: 12.2 10*3/uL — ABNORMAL HIGH (ref 4.0–10.5)
nRBC: 0 % (ref 0.0–0.2)

## 2019-05-21 LAB — URINE CULTURE

## 2019-05-21 NOTE — Plan of Care (Signed)
  Problem: Skin Integrity: Goal: Risk for impaired skin integrity will decrease Outcome: Progressing   Problem: Safety: Goal: Ability to remain free from injury will improve Outcome: Progressing   Problem: Pain Managment: Goal: General experience of comfort will improve Outcome: Progressing   Problem: Elimination: Goal: Will not experience complications related to bowel motility Outcome: Progressing   Problem: Clinical Measurements: Goal: Ability to maintain clinical measurements within normal limits will improve Outcome: Progressing   

## 2019-05-21 NOTE — Progress Notes (Addendum)
Dressing changes completed per MD order. Colostomy appliance changed d/t leakage. Additional colostomy supplies at bedside.  Large frequent semi solid green stools noted. Pt remains on contact enteric precautions.  Day shift nurse updated and dressings and new colostomy appliance viewed during bedside rounds.

## 2019-05-21 NOTE — Care Management (Addendum)
Awaiting COVID results for admission; still pending. CM spoke to [employer] ([facility]); the patient will need to arrive by 1630 today to sign admission paperwork or be admitted early tomorrow morning. CM updated Dr. Hinton Dyer RN (bedside RN).  Midge Minium MSN, RN, NCM-BC, ACM-RN [phone]

## 2019-05-21 NOTE — Progress Notes (Signed)
[facility])   Referral received this hospitalization for palliative services in the community once discharged.  [facility] will continue to follow and schedule visit once [name] [name] d/c's.  Venia Carbon RN, BSN, [facility] Liaison (in [place])

## 2019-05-21 NOTE — Plan of Care (Signed)
  Problem: Education: Goal: Knowledge of General Education information will improve Description: Including pain rating scale, medication(s)/side effects and non-pharmacologic comfort measures Outcome: Progressing   Problem: Health Behavior/Discharge Planning: Goal: Ability to manage health-related needs will improve Outcome: Progressing   Problem: Clinical Measurements: Goal: Will remain free from infection Outcome: Progressing   Problem: Activity: Goal: Risk for activity intolerance will decrease Outcome: Progressing   Problem: Nutrition: Goal: Adequate nutrition will be maintained Outcome: Progressing   Problem: Elimination: Goal: Will not experience complications related to bowel motility Outcome: Progressing Goal: Will not experience complications related to urinary retention Outcome: Progressing   Problem: Pain Managment: Goal: General experience of comfort will improve Outcome: Progressing   Problem: Safety: Goal: Ability to remain free from injury will improve Outcome: Progressing   Problem: Skin Integrity: Goal: Risk for impaired skin integrity will decrease Outcome: Progressing

## 2019-05-21 NOTE — Discharge Summary (Addendum)
Physician Discharge Summary  DYLANJAMES HUISMAN [id number] DOB: 1966/04/16 DOA: 05/10/2019  PCP: Andria Frames, PA-C  Admit date: 05/10/2019 Discharge date: 05/21/2019  Admitted From: Home Disposition: Skilled nursing facility  Recommendations for Outpatient Follow-up:  1. Follow up with PCP in 1-2 weeks 2. Please obtain BMP/CBC in one week 3. Please follow up on the following pending results:  Home Health: None Equipment/Devices: None  Discharge Condition: Stable Code Status: Full code Diet recommendation: Diet  Brief/Interim Summary/Hospital summary and discharge diagnosis:  54 year old African-American male with history of quadriplegia, chronic decubitus ulcers, recurrent UTI, colostomy and chronic suprapubic catheter, chronic hypotension on midodrine admitted with chills, nausea, vomiting and diarrhea.Stool tested positive for C. difficile and on oral vancomycin.He was started on cefepime due to concern for UTI, which has since been discontinued due to no organism shown on urine culture.  1/2-fever overnight 101.1 x 1. Fever again 1/3 night 102.8 Afebrile since 1/3. Blood culture has been no growth so far.  Repeat UA abnormal pyuria likely from suprapubic catheter status urine culture with multiple species.  Patient has been afebrile, tolerating diet. Given his CAD really prudent to hold off on antibiotics unless patient has known source or recurrent fever. He is deconditioned and weak and will need a skilled nursing facility. Being discharged to skilled nursing facility Updates 05/22/19 9:10 am No fever overnight, stool more formed and changed just now. Pt not in distress and unchanged from yesterday. covid is back and neg. stable for d/c to SNF this am  Discharge diagnosis: Sepsis secondary to C. Difficile: Stool becoming more formed.  Completing vancomycin  10 days and stop today.Blood cultures negative.    Fever: 1/2 101.1 x 1. Fever again 1/3 night 102.8. CXR 1/2:  "Increased consolidation at the right lung base with small to moderate volume right pleural fluid".  Repeat UA from 1/3 abnormal.    Obtained CT chest abdomen pelvis 1/4 no acute significant finding-see report below.  WBC count slightly of 12 point 6K.  Repeat CBC without leukocytosis.   Bacteriuria: repeat urine culture revealed multiple organisms presentsuspect chronic colonization w/ chronic suprapubic tube. Off cefepime-follow-up urine culture.   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 Cont supportive measures, offloading, continue home baclofen, Reglan  Chronic hypotension: On midodrine.  WILL HOLD OFF his lasix and potassium.  Follow-up with PCP to reassess if he needs it Hypomagnesemia: Was replaced.  Anemia of chronic disease: Hemoglobin is stable Recent Labs  Lab 05/17/19 0752 05/18/19 0911 05/19/19 1429 05/20/19 1516  HGB 8.3* 8.3* 8.4* 8.7*  HCT 26.9* 27.4* 27.6* 27.6*  Body mass index is 24.58 kg/m.  DVT prophylaxis: lovenox Code Status: full Family Communication: plan of care discussed with patient at bedside. disposition Plan: snf once covid negative. covid orderd 1/4 evening- not done- AC ordered stat today and will go to snf if negative  Consultants: Palliative care-follow-up with palliative care upon discharge Procedures: Chest abdomen and pelvis 1/4  1. No acute findings or explanation for the patient's symptoms. 2. Improved bilateral pleural effusions with persistent bilateral lower lobe airspace opacities suggesting chronic aspiration. These findings have mildly improved compared with the most recent chest CT of 2 weeks ago. 3. No acute intra-abdominal findings. No evidence of colitis or bowel obstruction. 4. Extensive decubitus ulcer formation along the posterior  aspect of the lower right  chest wall and both buttocks, right greater than left. 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  Blood culture from 1/2 neg so far. C. difficile PCR positive for antigen negative for toxin  Subjective: Resting comfortably.  No nausea vomiting.  Denies any complaint.  Afebrile for more than 24 hours.  Discharge Exam: Vitals:   05/20/19 2031 05/21/19 0428  BP: 118/77 117/77  Pulse: 98 94  Resp: 16 17  Temp: 99.1 F (37.3 C) 97.8 F (36.6 C)  SpO2: 98% 98%   General: aaox3, nad, on ra Cardiovascular: RRR, S1/S2 +, no rubs, no gallops Respiratory: CTA bilaterally, no wheezing, no rhonchi Abdominal: Soft, NT, ND, colostomy + , SP CATHETER + Extremities: Contractures present in upper and lower extremities with bedsores.     Discharge Instructions  Discharge Instructions    Diet - low sodium heart healthy   Complete by: As directed    Discharge instructions   Complete by: As directed    Please call call MD or return to ER for similar or worsening recurring problem that brought you to hospital or if any fever,nausea/vomiting,abdominal pain, uncontrolled pain, chest pain,  shortness of breath or any other alarming symptoms.  Please follow-up your doctor as instructed in a week time and call the office for appointment.  Please avoid alcohol, smoking, or any other illicit substance and maintain healthy habits including taking your regular medications as prescribed.  You were cared for by a hospitalist during your hospital stay. If you have any questions about your discharge medications or the care you received while you were in the hospital after you are discharged, you can call the unit and ask to  speak with the hospitalist on call if the hospitalist that took care of you is not available.  Once you are discharged, your primary care physician will handle any further medical issues. Please note that NO REFILLS for any discharge medications will be authorized once you are discharged, as it is imperative that you return to your primary care physician (or establish a relationship with a primary care physician if you do not have one) for your aftercare needs so that they can reassess your need for medications and monitor your lab values   Increase activity slowly   Complete by: As directed      Allergies as of 05/21/2019      Reactions   Ferumoxytol Anaphylaxis, Other (See Comments)   (Feraheme) = "SYNCOPE; patient tolerated Venofer 10/01/18 s rxn"   Oxybutynin Other (See Comments)   Hallucinations    Vancomycin Other (See Comments)   ARF 05-2016 -- affects kidneys      Medication List    STOP taking these medications   furosemide 20 MG tablet Commonly known as: LASIX   potassium chloride SA 20 MEQ tablet Commonly known as: KLOR-CON     TAKE these medications   acetaminophen 325 MG tablet Commonly known as: TYLENOL Take 2 tablets (650 mg total) by mouth every 6 (six) hours as needed for mild pain, fever or headache.   albuterol 108 (90 Base) MCG/ACT inhaler Commonly known as: VENTOLIN HFA Inhale 2 puffs into the lungs every 6 (six) hours as needed for wheezing or shortness of breath.   baclofen 20 MG tablet Commonly known as: LIORESAL Take  20 mg by mouth 2 (two) times daily.   Chlorhexidine Gluconate Cloth 2 % Pads Apply 6 each topically daily. Use as directed   feeding supplement (PRO-STAT SUGAR FREE 64) Liqd Take 30 mLs by mouth 2 (two) times daily.   ferrous sulfate 325 (65 FE) MG tablet TAKE 1 TABLET (325 MG TOTAL) BY MOUTH 3 (THREE) TIMES DAILY WITH MEALS. What changed: See the new instructions.   guaiFENesin 100 MG/5ML Soln Commonly known as: ROBITUSSIN Take 5  mLs (100 mg total) by mouth every 4 (four) hours as needed for cough or to loosen phlegm.   levalbuterol 45 MCG/ACT inhaler Commonly known as: XOPENEX HFA Inhale 1 puff into the lungs every 4 (four) hours as needed for wheezing.   magnesium oxide 400 MG tablet Commonly known as: MAG-OX Take 400 mg by mouth daily with breakfast.   metoCLOPramide 10 MG tablet Commonly known as: REGLAN Take 1 tablet (10 mg total) by mouth 3 (three) times daily with meals.   midodrine 10 MG tablet Commonly known as: PROAMATINE Take 1 tablet (10 mg total) by mouth 3 (three) times daily with meals.   multivitamin with minerals Tabs tablet Take 1 tablet by mouth daily.   nutrition supplement (JUVEN) Pack Take 1 packet by mouth 2 (two) times daily between meals.   Ensure Max Protein Liqd Take 330 mLs (11 oz total) by mouth daily.   ondansetron 8 MG disintegrating tablet Commonly known as: Zofran ODT Take 1 tablet (8 mg total) by mouth every 8 (eight) hours as needed for nausea or vomiting. What changed: reasons to take this   pantoprazole 40 MG tablet Commonly known as: PROTONIX Take 40 mg by mouth 2 (two) times daily.   PROBIOTIC PO Take 1 capsule by mouth daily.   Santyl ointment Generic drug: collagenase Apply 1 application topically See admin instructions. Apply daily as directed to affected area(s) of the right hip   senna-docusate 8.6-50 MG tablet Commonly known as: Senokot-S Take 2 tablets by mouth at bedtime as needed for mild constipation or moderate constipation.   sucralfate 1 g tablet Commonly known as: CARAFATE Take 1 tablet (1 g total) by mouth 4 (four) times daily.   Toviaz 8 MG Tb24 tablet Generic drug: fesoterodine Take 8 mg by mouth daily.   vitamin C 500 MG tablet Commonly known as: ASCORBIC ACID Take 500 mg by mouth daily.   Zinc 50 MG Tabs Take 50 mg by mouth 2 (two) times daily.      Follow-up Information    Park Meo T, PA-C Follow up in 1  week(s).   Specialty: Physician Assistant Contact information: [place] [address] [phone]          Allergies  Allergen Reactions  . Ferumoxytol Anaphylaxis and Other (See Comments)    (Feraheme) = "SYNCOPE; patient tolerated Venofer 10/01/18 s rxn"   . Oxybutynin Other (See Comments)    Hallucinations    . Vancomycin Other (See Comments)    ARF 05-2016 -- affects kidneys     The results of significant diagnostics from this hospitalization (including imaging, microbiology, ancillary and laboratory) are listed below for reference.    Microbiology: Recent Results (from the past 240 hour(s))  C difficile quick scan w PCR reflex     Status: Abnormal   Collection Time: 05/11/19  2:14 PM   Specimen: STOOL  Result Value Ref Range Status   C Diff antigen POSITIVE (A) NEGATIVE Final   C Diff  toxin NEGATIVE NEGATIVE Final   C Diff interpretation Results are indeterminate. See PCR results.  Final    Comment: Performed at [facility], [place] [address] [place], [place] [address]  C. Diff by PCR, Reflexed     Status: Abnormal   Collection Time: 05/11/19  3:57 PM  Result Value Ref Range Status   Toxigenic C. Difficile by PCR POSITIVE (A) NEGATIVE Final    Comment: Positive for toxigenic C. difficile with little to no toxin production. Only treat if clinical presentation suggests symptomatic illness. Performed at [facility], [place] [address] [place], [place] [address]   Aerobic Culture (superficial specimen)     Status: Abnormal   Collection Time: 05/12/19  1:22 PM   Specimen: Wound  Result Value Ref Range Status   Specimen Description WOUND  Final   Special Requests NONE  Final   Gram Stain   Final    NO WBC SEEN RARE GRAM POSITIVE COCCI IN PAIRS FEW GRAM POSITIVE RODS Performed at [facility], [address] [place], [name] [address]    Culture MULTIPLE ORGANISMS PRESENT, NONE PREDOMINANT (A)  Final   Report Status  05/14/2019 FINAL  Final  SARS CORONAVIRUS 2 (TAT 6-24 HRS) Nasopharyngeal Nasopharyngeal Swab     Status: None   Collection Time: 05/16/19  9:09 AM   Specimen: Nasopharyngeal Swab  Result Value Ref Range Status   SARS Coronavirus 2 NEGATIVE NEGATIVE Final    Comment: (NOTE) SARS-CoV-2 target nucleic acids are NOT DETECTED. The SARS-CoV-2 RNA is generally detectable in upper and lower respiratory specimens during the acute phase of infection. Negative results do not preclude SARS-CoV-2 infection, do not rule out co-infections with other pathogens, and should not be used as the sole basis for treatment or other patient management decisions. Negative results must be combined with clinical observations, patient history, and epidemiological information. The expected result is Negative. Fact Sheet for Patients: SugarRoll.be Fact Sheet for Healthcare Providers: https://www.woods-mathews.com/ This test is not yet approved or cleared by the [place] FDA and  has been authorized for detection and/or diagnosis of SARS-CoV-2 by FDA under an Emergency Use Authorization (EUA). This EUA will remain  in effect (meaning this test can be used) for the duration of the COVID-19 declaration under Section 56 4(b)(1) of the Act, 21 U.S.C. section 360bbb-3(b)(1), unless the authorization is terminated or revoked sooner. Performed at [facility], [place] [address], [place], [place] [address]   Urine Culture     Status: Abnormal   Collection Time: 05/18/19 12:01 PM   Specimen: Urine, Random  Result Value Ref Range Status   Specimen Description URINE, RANDOM  Final   Special Requests   Final    NONE Performed at [facility], [place] [address] [place], [place] [address]    Culture MULTIPLE SPECIES PRESENT, SUGGEST RECOLLECTION (A)  Final   Report Status 05/19/2019 FINAL  Final  Culture, blood (routine x 2)     Status: None (Preliminary result)    Collection Time: 05/18/19  8:47 PM   Specimen: BLOOD  Result Value Ref Range Status   Specimen Description BLOOD LEFT HAND  Final   Special Requests   Final    BOTTLES DRAWN AEROBIC ONLY Blood Culture results may not be optimal due to an inadequate volume of blood received in culture bottles   Culture   Final    NO GROWTH 2 DAYS Performed at [facility], [place]  [address] [place], [place] [address]    Report Status PENDING  Incomplete  Culture, blood (routine x 2)     Status: None (Preliminary result)   Collection Time: 05/18/19  8:55 PM   Specimen: BLOOD  Result Value Ref Range Status   Specimen Description BLOOD LEFT THUMB  Final   Special Requests   Final    BOTTLES DRAWN AEROBIC ONLY Blood Culture adequate volume   Culture   Final    NO GROWTH 2 DAYS Performed at [facility], [place] [address], [place], [place] [address]    Report Status PENDING  Incomplete  Culture, Urine     Status: Abnormal   Collection Time: 05/20/19 11:35 AM   Specimen: Urine, Suprapubic  Result Value Ref Range Status   Specimen Description URINE, SUPRAPUBIC  Final   Special Requests   Final    NONE Performed at [facility], [place] [address], [place], [place] [address]    Culture MULTIPLE SPECIES PRESENT, SUGGEST RECOLLECTION (A)  Final   Report Status 05/21/2019 FINAL  Final    Procedures/Studies: CT ABDOMEN PELVIS WO CONTRAST  Result Date: 05/20/2019 CLINICAL DATA:  Fever and abdominal pain. Abnormal chest x-ray. Clostridium difficile colitis. EXAM: CT CHEST, ABDOMEN AND PELVIS WITHOUT CONTRAST TECHNIQUE: Multidetector CT imaging of the chest, abdomen and pelvis was performed following the standard protocol without IV contrast. COMPARISON:  Radiographs 05/18/2019 and 05/16/2019. Chest CT 05/02/2019. Abdominal CT 03/05/2019. FINDINGS: CT CHEST FINDINGS Cardiovascular: No significant vascular findings on noncontrast imaging. There is minimal aortic atherosclerosis. The heart size is stable.  There is a stable small pericardial effusion. Mediastinum/Nodes: Small mediastinal lymph nodes are stable. There is stable mild wall thickening of the distal esophagus. 2.1 cm left thyroid nodule on image 15/3 is unchanged, previously evaluated by ultrasound and biopsy. Lungs/Pleura: Previously demonstrated bilateral pleural effusions have improved. There are persistent airspace opacities in both lower lobes with associated volume loss and central airway thickening. The overall aeration of the lung bases has improved compared with the prior CT. Musculoskeletal/Chest wall: Stable expansile lesion involving the posterior aspect of the right 5th rib with adjacent erosion of the 4th rib and scapula. Old left-sided rib fractures and thoracic scoliosis noted. No acute osseous findings. CT ABDOMEN AND PELVIS FINDINGS Hepatobiliary: The liver appears unremarkable as imaged in the noncontrast state. No significant biliary dilatation post cholecystectomy. Pancreas: Unremarkable. No pancreatic ductal dilatation or surrounding inflammatory changes. Spleen: Normal in size without focal abnormality. Adrenals/Urinary Tract: Both adrenal glands appear normal. The kidneys and ureters appear normal. No evidence of urinary tract calculus or hydronephrosis. Patient has a suprapubic bladder catheter associated with mild chronic bladder wall thickening. Stomach/Bowel: No evidence of bowel wall thickening, distention or surrounding inflammatory change. The stomach is mildly distended. There is a widely patent gastrojejunostomy. Vascular/Lymphatic: There are no enlarged abdominal lymph nodes. Scattered prominent pelvic lymph nodes bilaterally are stable, likely reactive. Mild aortoiliac atherosclerosis without acute vascular findings on noncontrast imaging. Reproductive: The prostate gland and seminal vesicles appear stable. Other: No ascites or focal abdominal wall hernia identified. Musculoskeletal: Chronic bilateral flank edema again  noted. There is extensive decubitus ulcer formation along the posterior aspect of the lower right chest wall and both buttocks, right greater than left. Probable osteomyelitis of the right 12th rib does not appear progressive. There are extensive chronic deformities of the bony pelvis and both hips. There are chronic bilateral hip dislocations with chronic destruction of the right femoral head and chronic soft  tissue emphysema associated with the right hip joint. The lower sacrum is chronically destroyed or previously resected. Overall appearance is similar to previous CT from 03/05/2019, and no progressive bone destruction identified. IMPRESSION: 1. No acute findings or explanation for the patient's symptoms. 2. Improved bilateral pleural effusions with persistent bilateral lower lobe airspace opacities suggesting chronic aspiration. These findings have mildly improved compared with the most recent chest CT of 2 weeks ago. 3. No acute intra-abdominal findings. No evidence of colitis or bowel obstruction. 4. Extensive decubitus ulcer formation along the posterior aspect of the lower right chest wall and both buttocks, right greater than left. 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 Electronically Signed   By: Richardean Sale M.D.   On: 05/20/2019 14:07   DG Chest 1 View  Result Date: 05/01/2019 CLINICAL DATA:  COVID-19 positive patient.  Quadriplegia. EXAM: CHEST  1 VIEW COMPARISON:  April 28, 2019 FINDINGS: The right central line is been removed. A right PICC line is been placed, terminating in the central SVC. A layering effusion on the right is again identified with underlying opacity. Mild opacity in the left perihilar and retrocardiac regions are stable. The cardiomediastinal silhouette is stable. No other  acute abnormalities. IMPRESSION: 1. A new right PICC line is in good position terminating in the central SVC. 2. Probable small bilateral layering effusions. Opacity underlies the right effusion, stable. Mild left perihilar and retrocardiac opacities are stable as well. Electronically Signed   By: Dorise Bullion III M.D   On: 05/01/2019 14:33   DG Chest 1 View  Result Date: 04/28/2019 CLINICAL DATA:  Central line placement EXAM: CHEST  1 VIEW COMPARISON:  04/21/2019 FINDINGS: Interval placement of a right neck vascular catheter, tip positioned near the superior cavoatrial junction. There are new, layering bilateral pleural effusions and associated atelectasis or consolidation. Cardiomegaly. Unchanged, rim calcified in expansile mass of the posterior right chest wall, better assessed by prior CT. IMPRESSION: 1. Interval placement of right neck vascular catheter, tip positioned near the superior cavoatrial junction. 2. New, layering bilateral pleural effusions with associated atelectasis or consolidation. 3. Cardiomegaly. Electronically Signed   By: Eddie Candle M.D.   On: 04/28/2019 14:58   DG Abd 1 View  Result Date: 05/16/2019 CLINICAL DATA:  Sepsis, diarrhea EXAM: ABDOMEN - 1 VIEW COMPARISON:  03/23/2019 FINDINGS: Mild diffuse gaseous distention of bowel. No evidence for bowel obstruction. No organomegaly or free air. No acute bony abnormality. IMPRESSION: Diffuse gaseous distention of bowel may reflect mild ileus. No obstruction or free air. Electronically Signed   By: Rolm Baptise M.D.   On: 05/16/2019 10:39   CT CHEST WO CONTRAST  Result Date: 05/20/2019 CLINICAL DATA:  Fever and abdominal pain. Abnormal chest x-ray. Clostridium difficile colitis. EXAM: CT CHEST, ABDOMEN AND PELVIS WITHOUT CONTRAST TECHNIQUE: Multidetector CT imaging of the chest, abdomen and pelvis was performed following the standard protocol without IV contrast. COMPARISON:  Radiographs 05/18/2019 and 05/16/2019. Chest CT  05/02/2019. Abdominal CT 03/05/2019. FINDINGS: CT CHEST FINDINGS Cardiovascular: No significant vascular findings on noncontrast imaging. There is minimal aortic atherosclerosis. The heart size is stable. There is a stable small pericardial effusion. Mediastinum/Nodes: Small mediastinal lymph nodes are stable. There is stable mild wall thickening of the distal esophagus. 2.1 cm left thyroid nodule on image 15/3 is unchanged, previously evaluated by ultrasound  and biopsy. Lungs/Pleura: Previously demonstrated bilateral pleural effusions have improved. There are persistent airspace opacities in both lower lobes with associated volume loss and central airway thickening. The overall aeration of the lung bases has improved compared with the prior CT. Musculoskeletal/Chest wall: Stable expansile lesion involving the posterior aspect of the right 5th rib with adjacent erosion of the 4th rib and scapula. Old left-sided rib fractures and thoracic scoliosis noted. No acute osseous findings. CT ABDOMEN AND PELVIS FINDINGS Hepatobiliary: The liver appears unremarkable as imaged in the noncontrast state. No significant biliary dilatation post cholecystectomy. Pancreas: Unremarkable. No pancreatic ductal dilatation or surrounding inflammatory changes. Spleen: Normal in size without focal abnormality. Adrenals/Urinary Tract: Both adrenal glands appear normal. The kidneys and ureters appear normal. No evidence of urinary tract calculus or hydronephrosis. Patient has a suprapubic bladder catheter associated with mild chronic bladder wall thickening. Stomach/Bowel: No evidence of bowel wall thickening, distention or surrounding inflammatory change. The stomach is mildly distended. There is a widely patent gastrojejunostomy. Vascular/Lymphatic: There are no enlarged abdominal lymph nodes. Scattered prominent pelvic lymph nodes bilaterally are stable, likely reactive. Mild aortoiliac atherosclerosis without acute vascular findings on  noncontrast imaging. Reproductive: The prostate gland and seminal vesicles appear stable. Other: No ascites or focal abdominal wall hernia identified. Musculoskeletal: Chronic bilateral flank edema again noted. There is extensive decubitus ulcer formation along the posterior aspect of the lower right chest wall and both buttocks, right greater than left. Probable osteomyelitis of the right 12th rib does not appear progressive. There are extensive chronic deformities of the bony pelvis and both hips. 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 The lower sacrum is chronically destroyed or previously resected. Overall appearance is similar to previous CT from 03/05/2019, and no progressive bone destruction identified. IMPRESSION: 1. No acute findings or explanation for the patient's symptoms. 2. Improved bilateral pleural effusions with persistent bilateral lower lobe airspace opacities suggesting chronic aspiration. These findings have mildly improved compared with the most recent chest CT of 2 weeks ago. 3. No acute intra-abdominal findings. No evidence of colitis or bowel obstruction. 4. Extensive decubitus ulcer formation along the posterior aspect of the lower right chest wall and both buttocks, right greater than left. 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 Electronically Signed   By: Richardean Sale M.D.   On: 05/20/2019 14:07   CT CHEST WO CONTRAST  Result Date: 05/02/2019 CLINICAL DATA:  Quadriplegia, prior C5 fracture, pneumonia, pleural effusion, tachycardia, leukocytosis, sepsis EXAM: CT CHEST WITHOUT CONTRAST TECHNIQUE: Multidetector CT imaging of the chest was performed following the  standard protocol without IV contrast. Sagittal and coronal MPR images reconstructed from axial data set. COMPARISON:  None FINDINGS: Cardiovascular: Minimal enlargement of cardiac silhouette and minimal pericardial effusion. Ascending thoracic aorta upper normal caliber 3.7 cm diameter. Minimal atherosclerotic calcification aorta. Mediastinum/Nodes: Questionable wall thickening of the mid to distal esophagus cannot exclude esophagitis or reflux. LEFT thyroid nodule 2.0 x 1.6 cm. Base of cervical region otherwise normal appearance. No thoracic adenopathy. Few normal size mediastinal lymph nodes seen. Lungs/Pleura: Small LEFT pleural effusion. Partially loculated small RIGHT pleural effusion. Compressive atelectasis of the lower lobes cannot exclude underlying consolidation. Minimal compressive atelectasis of the posterior RIGHT upper lobe. Remaining lungs clear.  No pneumothorax or definite pulmonary mass. Upper Abdomen: Visualized upper abdomen unremarkable Musculoskeletal: Diffuse osseous demineralization. Sternum and spine unremarkable. Expansile destructive bone lesion again identified at the posterior RIGHT fifth rib at 5.9 x 4.2 x. 4.6 cm not significantly changed. Old LEFT rib fractures. Question old RIGHT rib fractures as well. Degenerative changes of the spine with postsurgical changes at C5. IMPRESSION: Small BILATERAL pleural effusions, partially loculated on LEFT. Associated compressive atelectasis of the posterior lungs greatest in lower lobes, cannot exclude underlying consolidation especially in RIGHT lower lobe. Stable expansile bone lesion of the posterior RIGHT fifth rib. LEFT thyroid nodule, previously assessed by thyroid ultrasound and biopsy in 2019, recommend correlation with prior imaging and cytology results. Minimal pericardial effusion. Aortic Atherosclerosis (ICD10-I70.0). Electronically Signed   By: Lavonia Dana M.D.   On: 05/02/2019 11:40   IR REMOVAL TUN ACCESS W/ PORT W/O FL MOD  SED  Result Date: 04/26/2019 CLINICAL DATA:  Port catheter that I placed 07/13/2016, has worked well without complication. Recent suspected bloodstream infection, prophylactic central line removal requested. EXAM: EXAM TUNNELED PORT CATHETER REMOVAL TECHNIQUE: The procedure, risks (including but not limited to bleeding, infection, organ damage ), benefits, and alternatives were explained to the patient. Questions regarding the procedure were encouraged and answered. The patient understands and consents to the procedure. Intravenous Fentanyl 62mcg and Versed 1mg  were administered as conscious sedation during continuous monitoring of the patient's level of consciousness and physiological / cardiorespiratory status by the radiology RN, with a total moderate sedation time of 32 minutes. Overlying skin prepped with chlorhexidine, draped in usual sterile fashion, infiltrated locally with 1% lidocaine. A small incision was made over the scar from previous placement. The port catheter was dissected free from the underlying soft tissues and removed intact. Hemostasis was achieved. The port pocket was closed with deep interrupted and subcuticular continuous 3-0 Monocryl sutures, then covered with Dermabond. The patient tolerated the procedure well. COMPLICATIONS: COMPLICATIONS None immediate IMPRESSION: 1.  Technically successful tunneled Port catheter removal. Electronically Signed   By: Lucrezia Europe M.D.   On: 04/26/2019 14:49   DG CHEST PORT 1 VIEW  Result Date: 05/18/2019 CLINICAL DATA:  Update status fever EXAM: PORTABLE CHEST 1 VIEW COMPARISON:  Chest radiograph 05/10/2019 FINDINGS: Stable cardiomediastinal contours with enlarged heart size. Increased consolidation at the right lung base with small to moderate volume right pleural fluid. The left lung is clear. No pneumothorax. IMPRESSION: Increased consolidation at the right lung base with small to moderate volume right pleural fluid. Electronically Signed   By:  Audie Pinto M.D.   On: 05/18/2019 11:45   DG Chest Port 1 View  Result Date: 05/10/2019 CLINICAL DATA:  Sepsis EXAM: PORTABLE CHEST 1 VIEW COMPARISON:  May 02, 2019 FINDINGS: The heart size and mediastinal contours unchanged. Patchy/hazy airspace opacity seen at the right lung base, with slight interval worsening on the prior exam. The left lung appears to be well aerated. Healed posterior left rib fractures are noted. IMPRESSION: Interval slight worsening in the patchy airspace opacity at the right lung base which could be due to infectious etiology. Electronically Signed   By: Prudencio Pair M.D.   On: 05/10/2019 22:11   [place] RT UPPER EXTREM LTD SOFT TISSUE NON VASCULAR  Result Date: 04/28/2019 CLINICAL DATA:  SWELLING OF THE RIGHT ARM. EXAM: ULTRASOUND RIGHT UPPER EXTREMITY LIMITED TECHNIQUE: Ultrasound examination of the upper extremity soft tissues was performed in the area of clinical concern. COMPARISON:  None. FINDINGS: There is a small  amount of edema in the subcutaneous fat of the anterior aspect of the left upper arm. There is also a small amount of fluid surrounding the distal biceps muscle and proximal aspect of the distal biceps tendon. No discrete abscess. IMPRESSION: No discrete abscess. Nonspecific subcutaneous edema as well as edema around the distal biceps muscle and proximal aspect of the a distal biceps tendon. Electronically Signed   By: Lorriane Shire M.D.   On: 04/28/2019 09:22   VAS [place] UPPER EXTREMITY VENOUS DUPLEX  Result Date: 04/28/2019 UPPER VENOUS STUDY  Indications: Swelling, and Edema Limitations: Bandages, body habitus and poor ultrasound/tissue interface. Performing Technologist: Antonieta Pert RDMS, RVT  Examination Guidelines: A complete evaluation includes B-mode imaging, spectral Doppler, color Doppler, and power Doppler as needed of all accessible portions of each vessel. Bilateral testing is considered an integral part of a complete examination. Limited  examinations for reoccurring indications may be performed as noted.  Right Findings: +----------+------------+---------+-----------+----------+-------+ RIGHT     CompressiblePhasicitySpontaneousPropertiesSummary +----------+------------+---------+-----------+----------+-------+ Subclavian    Full       Yes       Yes                      +----------+------------+---------+-----------+----------+-------+  Left Findings: +----------+------------+---------+-----------+----------+--------------+ LEFT      CompressiblePhasicitySpontaneousProperties   Summary     +----------+------------+---------+-----------+----------+--------------+ IJV           Full                                                 +----------+------------+---------+-----------+----------+--------------+ Subclavian    Full                                                 +----------+------------+---------+-----------+----------+--------------+ Axillary      Full                                                 +----------+------------+---------+-----------+----------+--------------+ Brachial      Full       Yes       Yes                             +----------+------------+---------+-----------+----------+--------------+ Radial        Full                                                 +----------+------------+---------+-----------+----------+--------------+ Ulnar                                               Not visualized +----------+------------+---------+-----------+----------+--------------+ Cephalic      Full                                                 +----------+------------+---------+-----------+----------+--------------+  Basilic     Partial                        dilated      Acute      +----------+------------+---------+-----------+----------+--------------+  Summary:  Right: No evidence of thrombosis in the subclavian.  Left: No evidence of deep vein thrombosis  in the upper extremity. Findings consistent with acute superficial vein thrombosis involving the left basilic vein. However, unable to visualize the mid upper arm, obscured by bandage.  *See table(s) above for measurements and observations.  Diagnosing physician: Servando Snare MD Electronically signed by Servando Snare MD on 04/28/2019 at 3:57:19 PM.    Final    IRPICC PLACEMENT LEFT >5 YRS INC IMG GUIDE  Result Date: 04/26/2019 CLINICAL DATA:  Suspected bloodstream infection, port removal requested. Needs durable venous access for IV therapies. EXAM: PICC PLACEMENT WITH ULTRASOUND AND FLUOROSCOPY FLUOROSCOPY TIME:  0.6 minute; 184 uGym2 DAP TECHNIQUE: After written informed consent was obtained, patient was placed in the supine position on angiographic table. Patency of the left basilic vein was confirmed with ultrasound with image documentation. An appropriate skin site was determined. Skin site was marked. Region was prepped using maximum barrier technique including cap and mask, sterile gown, sterile gloves, large sterile sheet, and Chlorhexidine as cutaneous antisepsis. The region was infiltrated locally with 1% lidocaine. Under real-time ultrasound guidance, the left basilic vein was accessed with a 21 gauge micropuncture needle; the needle tip within the vein was confirmed with ultrasound image documentation. Needle exchanged over a 018 guidewire for a peel-away sheath, through which a 5-French double-lumen power injectable PICC trimmed to 46cm was advanced, positioned with its tip near the cavoatrial junction. Spot chest radiograph confirms appropriate catheter position. Catheter was flushed per protocol and secured externally. The patient tolerated procedure well. COMPLICATIONS: COMPLICATIONS none IMPRESSION: 1. Technically successful five [place] double lumen power injectable PICC placement Electronically Signed   By: Lucrezia Europe M.D.   On: 04/26/2019 14:50   IR PICC PLACEMENT RIGHT >5 YRS INC IMG  GUIDE  Result Date: 04/29/2019 INDICATION: 54 year old male quadriplegic with poor venous access. He recently had a right chest port catheter removed for bacteremia and requires line replacement for IV antibiotic therapy. He presents for placement of a PICC. EXAM: PICC LINE PLACEMENT WITH ULTRASOUND AND FLUOROSCOPIC GUIDANCE MEDICATIONS: None. ANESTHESIA/SEDATION: None. FLUOROSCOPY TIME:  Fluoroscopy Time: 0 minutes 42 seconds (3.8 mGy). COMPLICATIONS: None immediate. PROCEDURE: The patient was advised of the possible risks and complications and agreed to undergo the procedure. The patient was then brought to the angiographic suite for the procedure. The right arm was prepped with chlorhexidine, draped in the usual sterile fashion using maximum barrier technique (cap and mask, sterile gown, sterile gloves, large sterile sheet, hand hygiene and cutaneous antisepsis) and infiltrated locally with 1% Lidocaine. Ultrasound demonstrated patency of the right basilic vein, and this was documented with an image. Under real-time ultrasound guidance, this vein was accessed with a 21 gauge micropuncture needle and image documentation was performed. A 0.018 wire was introduced in to the vein. Over this, a 6 [place] dual lumen power-injectable PICC was advanced to the lower SVC/right atrial junction. Fluoroscopy during the procedure and fluoro spot radiograph confirms appropriate catheter position. The catheter was flushed and covered with a sterile dressing. Catheter length: 34 cm IMPRESSION: Successful right arm Power PICC line placement with ultrasound and fluoroscopic guidance. The catheter is ready for use. Electronically Signed   By: Jacqulynn Cadet  M.D.   On: 04/29/2019 16:55   [place] EKG SITE RITE  Result Date: 04/28/2019 If Site Rite image not attached, placement could not be confirmed due to current cardiac rhythm.   Labs: BNP (last 3 results) Recent Labs    03/23/19 0540  BNP [id number]   Basic Metabolic  Panel: Recent Labs  Lab 05/15/19 0819 05/16/19 0608 05/17/19 0752 05/18/19 0911 05/20/19 1211  NA 138 138 138 135 136  K 4.1 3.5 3.5 3.6 4.1  CL 107 105 104 101 105  CO2 24 23 25 25  20*  GLUCOSE 85 98 72 85 93  BUN <5* 5* <5* 7 10  CREATININE <0.30* 0.52* <0.30* <0.30* <0.30*  CALCIUM 7.8* 8.1* 7.8* 7.8* 7.7*  MG 1.3* 1.6* 1.6*  --   --    Liver Function Tests: No results for input(s): AST, ALT, ALKPHOS, BILITOT, PROT, ALBUMIN in the last 168 hours. No results for input(s): LIPASE, AMYLASE in the last 168 hours. No results for input(s): AMMONIA in the last 168 hours. CBC: Recent Labs  Lab 05/17/19 0752 05/18/19 0911 05/19/19 1429 05/20/19 1516  WBC 9.6 11.2* 12.6* 10.2  NEUTROABS  --  9.5* 10.7* 9.2*  HGB 8.3* 8.3* 8.4* 8.7*  HCT 26.9* 27.4* 27.6* 27.6*  MCV 83.3 84.0 83.6 81.2  PLT 347 212 402* 236   Cardiac Enzymes: No results for input(s): CKTOTAL, CKMB, CKMBINDEX, TROPONINI in the last 168 hours. BNP: Invalid input(s): POCBNP CBG: No results for input(s): GLUCAP in the last 168 hours. D-Dimer No results for input(s): DDIMER in the last 72 hours. Hgb A1c No results for input(s): HGBA1C in the last 72 hours. Lipid Profile No results for input(s): CHOL, HDL, LDLCALC, TRIG, CHOLHDL, LDLDIRECT in the last 72 hours. Thyroid function studies No results for input(s): TSH, T4TOTAL, T3FREE, THYROIDAB in the last 72 hours.  Invalid input(s): FREET3 Anemia work up No results for input(s): VITAMINB12, FOLATE, FERRITIN, TIBC, IRON, RETICCTPCT in the last 72 hours. Urinalysis    Component Value Date/Time   COLORURINE AMBER (A) 05/18/2019 1335   APPEARANCEUR CLOUDY (A) 05/18/2019 1335   LABSPEC 1.014 05/18/2019 1335   PHURINE 6.0 05/18/2019 1335   GLUCOSEU NEGATIVE 05/18/2019 1335   HGBUR NEGATIVE 05/18/2019 1335   BILIRUBINUR NEGATIVE 05/18/2019 1335   KETONESUR NEGATIVE 05/18/2019 1335   PROTEINUR 30 (A) 05/18/2019 1335   UROBILINOGEN 1.0 03/16/2015 0835    NITRITE NEGATIVE 05/18/2019 1335   LEUKOCYTESUR LARGE (A) 05/18/2019 1335   Sepsis Labs Invalid input(s): PROCALCITONIN,  WBC,  LACTICIDVEN Microbiology Recent Results (from the past 240 hour(s))  C difficile quick scan w PCR reflex     Status: Abnormal   Collection Time: 05/11/19  2:14 PM   Specimen: STOOL  Result Value Ref Range Status   C Diff antigen POSITIVE (A) NEGATIVE Final   C Diff toxin NEGATIVE NEGATIVE Final   C Diff interpretation Results are indeterminate. See PCR results.  Final    Comment: Performed at [facility], [place] [address], [place], [place] [address]  C. Diff by PCR, Reflexed     Status: Abnormal   Collection Time: 05/11/19  3:57 PM  Result Value Ref Range Status   Toxigenic C. Difficile by PCR POSITIVE (A) NEGATIVE Final    Comment: Positive for toxigenic C. difficile with little to no toxin production. Only treat if clinical presentation suggests symptomatic illness. Performed at [facility], [place] [address], [place], [place] [address]   Aerobic Culture (superficial specimen)  Status: Abnormal   Collection Time: 05/12/19  1:22 PM   Specimen: Wound  Result Value Ref Range Status   Specimen Description WOUND  Final   Special Requests NONE  Final   Gram Stain   Final    NO WBC SEEN RARE GRAM POSITIVE COCCI IN PAIRS FEW GRAM POSITIVE RODS Performed at [facility], [place] [address], [place], [place] [address]    Culture MULTIPLE ORGANISMS PRESENT, NONE PREDOMINANT (A)  Final   Report Status 05/14/2019 FINAL  Final  SARS CORONAVIRUS 2 (TAT 6-24 HRS) Nasopharyngeal Nasopharyngeal Swab     Status: None   Collection Time: 05/16/19  9:09 AM   Specimen: Nasopharyngeal Swab  Result Value Ref Range Status   SARS Coronavirus 2 NEGATIVE NEGATIVE Final    Comment: (NOTE) SARS-CoV-2 target nucleic acids are NOT DETECTED. The SARS-CoV-2 RNA is generally detectable in upper and lower respiratory specimens during the acute phase of infection.  Negative results do not preclude SARS-CoV-2 infection, do not rule out co-infections with other pathogens, and should not be used as the sole basis for treatment or other patient management decisions. Negative results must be combined with clinical observations, patient history, and epidemiological information. The expected result is Negative. Fact Sheet for Patients: SugarRoll.be Fact Sheet for Healthcare Providers: https://www.woods-mathews.com/ This test is not yet approved or cleared by the [place] FDA and  has been authorized for detection and/or diagnosis of SARS-CoV-2 by FDA under an Emergency Use Authorization (EUA). This EUA will remain  in effect (meaning this test can be used) for the duration of the COVID-19 declaration under Section 56 4(b)(1) of the Act, 21 U.S.C. section 360bbb-3(b)(1), unless the authorization is terminated or revoked sooner. Performed at [facility], [place] [address], [place], [place] [address]   Urine Culture     Status: Abnormal   Collection Time: 05/18/19 12:01 PM   Specimen: Urine, Random  Result Value Ref Range Status   Specimen Description URINE, RANDOM  Final   Special Requests   Final    NONE Performed at [facility], [place] [address] [place], [place] [address]    Culture MULTIPLE SPECIES PRESENT, SUGGEST RECOLLECTION (A)  Final   Report Status 05/19/2019 FINAL  Final  Culture, blood (routine x 2)     Status: None (Preliminary result)   Collection Time: 05/18/19  8:47 PM   Specimen: BLOOD  Result Value Ref Range Status   Specimen Description BLOOD LEFT HAND  Final   Special Requests   Final    BOTTLES DRAWN AEROBIC ONLY Blood Culture results may not be optimal due to an inadequate volume of blood received in culture bottles   Culture   Final    NO GROWTH 2 DAYS Performed at [facility], [place] [address] [place], [place] [address]    Report Status PENDING  Incomplete   Culture, blood (routine x 2)     Status: None (Preliminary result)   Collection Time: 05/18/19  8:55 PM   Specimen: BLOOD  Result Value Ref Range Status   Specimen Description BLOOD LEFT THUMB  Final   Special Requests   Final    BOTTLES DRAWN AEROBIC ONLY Blood Culture adequate volume   Culture   Final    NO GROWTH 2 DAYS Performed at [facility], [place] [address], [place], [place] [address]    Report Status PENDING  Incomplete  Culture, Urine     Status: Abnormal  Collection Time: 05/20/19 11:35 AM   Specimen: Urine, Suprapubic  Result Value Ref Range Status   Specimen Description URINE, SUPRAPUBIC  Final   Special Requests   Final    NONE Performed at [facility], [name] [address], [place], [place] [address]    Culture MULTIPLE SPECIES PRESENT, SUGGEST RECOLLECTION (A)  Final   Report Status 05/21/2019 FINAL  Final     Time coordinating discharge: 35  minutes  SIGNED: Antonieta Pert, MD  [facility] 05/21/2019, 11:31 AM  If 7PM-7AM, please contact night-coverage [email or web address]

## 2019-05-22 NOTE — TOC Transition Note (Signed)
Transition of Care [facility]) - CM/SW Discharge Note   Patient Details  Name: [name] [name] MRN: [id number] Date of Birth: [birth year]  Transition of Care [facility]) CM/SW Contact:  Midge Minium MSN, RN, NCM-BC, ACM-RN [phone] Phone Number: 05/22/2019, 9:49 AM   Clinical Narrative:    Patient is medically stable to transition to [facility] for [place] rehab. Discharge paperwork has been sent to [place] ([facility] Admissions Coordinator); PTAR arranged for 1100.  Please call report to: [phone]; room 116   Final next level of care: Skilled Nursing Facility Barriers to Discharge: No Barriers Identified   Patient Goals and CMS Choice Patient states their goals for this hospitalization and ongoing recovery are:: to go to SNF and go home CMS Medicare.gov Compare Post Acute Care list provided to:: Patient Choice offered to / list presented to : Patient  Discharge Placement              Patient chooses bed at: Other - please specify in the comment section below:([facility]) Patient to be transferred to facility by: [place] Name of family member notified: patient Patient and family notified of of transfer: 05/22/19  Discharge Plan and Services     Post Acute Care Choice: [place]                               Social Determinants of Health (SDOH) Interventions     Readmission Risk Interventions Readmission Risk Prevention Plan 05/22/2019 05/20/2019 04/29/2019  Transportation Screening - Complete Complete  PCP or Specialist Appt within 5-7 Days - - -  PCP or Specialist Appt within 3-5 Days - - -  Not Complete comments - - -  Home Care Screening - - -  Medication Review (RN CM) - - -  HRI or [place] Work Consult for [place] - - -  Medication Review Press photographer) - Complete Complete  PCP or Specialist appointment within 3-5 days of discharge Complete Not Complete  Complete  PCP/Specialist Appt Not Complete comments - DC date unknown -  HRI or [place] - Complete Complete  SW Recovery Care/Counseling Consult - Complete Complete  Palliative Care Screening - Not Applicable Not Applicable  Comments - - -  [place] - Complete Complete  Some recent data might be hidden

## 2019-05-22 NOTE — Plan of Care (Addendum)
Pt discharging to [facility]. Tried to call facility to give report 3 different times and left voicemail to call me back for report at 1145. Discharge instructions left with PTAR to give to facility, instructions explained to pt and pt verbalized understanding. No questions or concerns voiced. Packed all personal belongings and provided a bag of supplies for dressing changes, colostomy bag, etc for [facility] to use. PTAR currently here prepping pt for transportation.   Facility RN called back at 1210 and report was given, pt has discharged from [facility] with PTAR.   Problem: Education: Goal: Knowledge of General Education information will improve Description: Including pain rating scale, medication(s)/side effects and non-pharmacologic comfort measures Outcome: Completed/Met   Problem: Health Behavior/Discharge Planning: Goal: Ability to manage health-related needs will improve Outcome: Completed/Met   Problem: Clinical Measurements: Goal: Ability to maintain clinical measurements within normal limits will improve Outcome: Completed/Met Goal: Will remain free from infection Outcome: Completed/Met Goal: Diagnostic test results will improve Outcome: Completed/Met Goal: Respiratory complications will improve Outcome: Completed/Met Goal: Cardiovascular complication will be avoided Outcome: Completed/Met   Problem: Activity: Goal: Risk for activity intolerance will decrease Outcome: Completed/Met   Problem: Nutrition: Goal: Adequate nutrition will be maintained Outcome: Completed/Met   Problem: Coping: Goal: Level of anxiety will decrease Outcome: Completed/Met   Problem: Elimination: Goal: Will not experience complications related to bowel motility Outcome: Completed/Met Goal: Will not experience complications related to urinary retention Outcome: Completed/Met   Problem: Pain Managment: Goal: General experience of comfort will improve Outcome: Completed/Met   Problem:  Safety: Goal: Ability to remain free from injury will improve Outcome: Completed/Met   Problem: Skin Integrity: Goal: Risk for impaired skin integrity will decrease Outcome: Completed/Met

## 2019-05-22 NOTE — Progress Notes (Signed)
D/c summary updated Stable for d/c today. Pt seen/examined, no change in plan. covid swab is back and negative

## 2019-05-23 LAB — CULTURE, BLOOD (ROUTINE X 2)
Culture: NO GROWTH
Culture: NO GROWTH
Special Requests: ADEQUATE

## 2019-05-24 DIAGNOSIS — R5381 Other malaise: Secondary | ICD-10-CM | POA: Diagnosis not present

## 2019-05-24 DIAGNOSIS — I959 Hypotension, unspecified: Secondary | ICD-10-CM | POA: Diagnosis not present

## 2019-05-24 DIAGNOSIS — J449 Chronic obstructive pulmonary disease, unspecified: Secondary | ICD-10-CM | POA: Diagnosis not present

## 2019-05-24 DIAGNOSIS — D638 Anemia in other chronic diseases classified elsewhere: Secondary | ICD-10-CM | POA: Diagnosis not present

## 2019-05-27 DIAGNOSIS — D638 Anemia in other chronic diseases classified elsewhere: Secondary | ICD-10-CM | POA: Diagnosis not present

## 2019-05-27 DIAGNOSIS — R5381 Other malaise: Secondary | ICD-10-CM | POA: Diagnosis not present

## 2019-05-27 DIAGNOSIS — I959 Hypotension, unspecified: Secondary | ICD-10-CM | POA: Diagnosis not present

## 2019-05-27 DIAGNOSIS — J449 Chronic obstructive pulmonary disease, unspecified: Secondary | ICD-10-CM | POA: Diagnosis not present

## 2019-05-28 DIAGNOSIS — J449 Chronic obstructive pulmonary disease, unspecified: Secondary | ICD-10-CM | POA: Diagnosis not present

## 2019-05-28 DIAGNOSIS — D638 Anemia in other chronic diseases classified elsewhere: Secondary | ICD-10-CM | POA: Diagnosis not present

## 2019-05-28 DIAGNOSIS — A0472 Enterocolitis due to Clostridium difficile, not specified as recurrent: Secondary | ICD-10-CM | POA: Diagnosis not present

## 2019-05-28 DIAGNOSIS — I959 Hypotension, unspecified: Secondary | ICD-10-CM | POA: Diagnosis not present

## 2019-06-04 DIAGNOSIS — Z20822 Contact with and (suspected) exposure to covid-19: Secondary | ICD-10-CM | POA: Diagnosis not present

## 2019-06-10 DIAGNOSIS — G8250 Quadriplegia, unspecified: Secondary | ICD-10-CM | POA: Diagnosis not present

## 2019-06-10 DIAGNOSIS — K219 Gastro-esophageal reflux disease without esophagitis: Secondary | ICD-10-CM | POA: Diagnosis not present

## 2019-06-10 DIAGNOSIS — R5381 Other malaise: Secondary | ICD-10-CM | POA: Diagnosis not present

## 2019-06-10 DIAGNOSIS — Z933 Colostomy status: Secondary | ICD-10-CM | POA: Diagnosis not present

## 2019-06-15 IMAGING — DX DG CHEST 1V PORT
1 series · 1 of 1 positions shown · non-contrast
Comparison: Prior radiograph from 08/28/2015.

CLINICAL DATA: Initial evaluation for acute sepsis.

EXAM:
PORTABLE CHEST 1 VIEW

[chest ap]
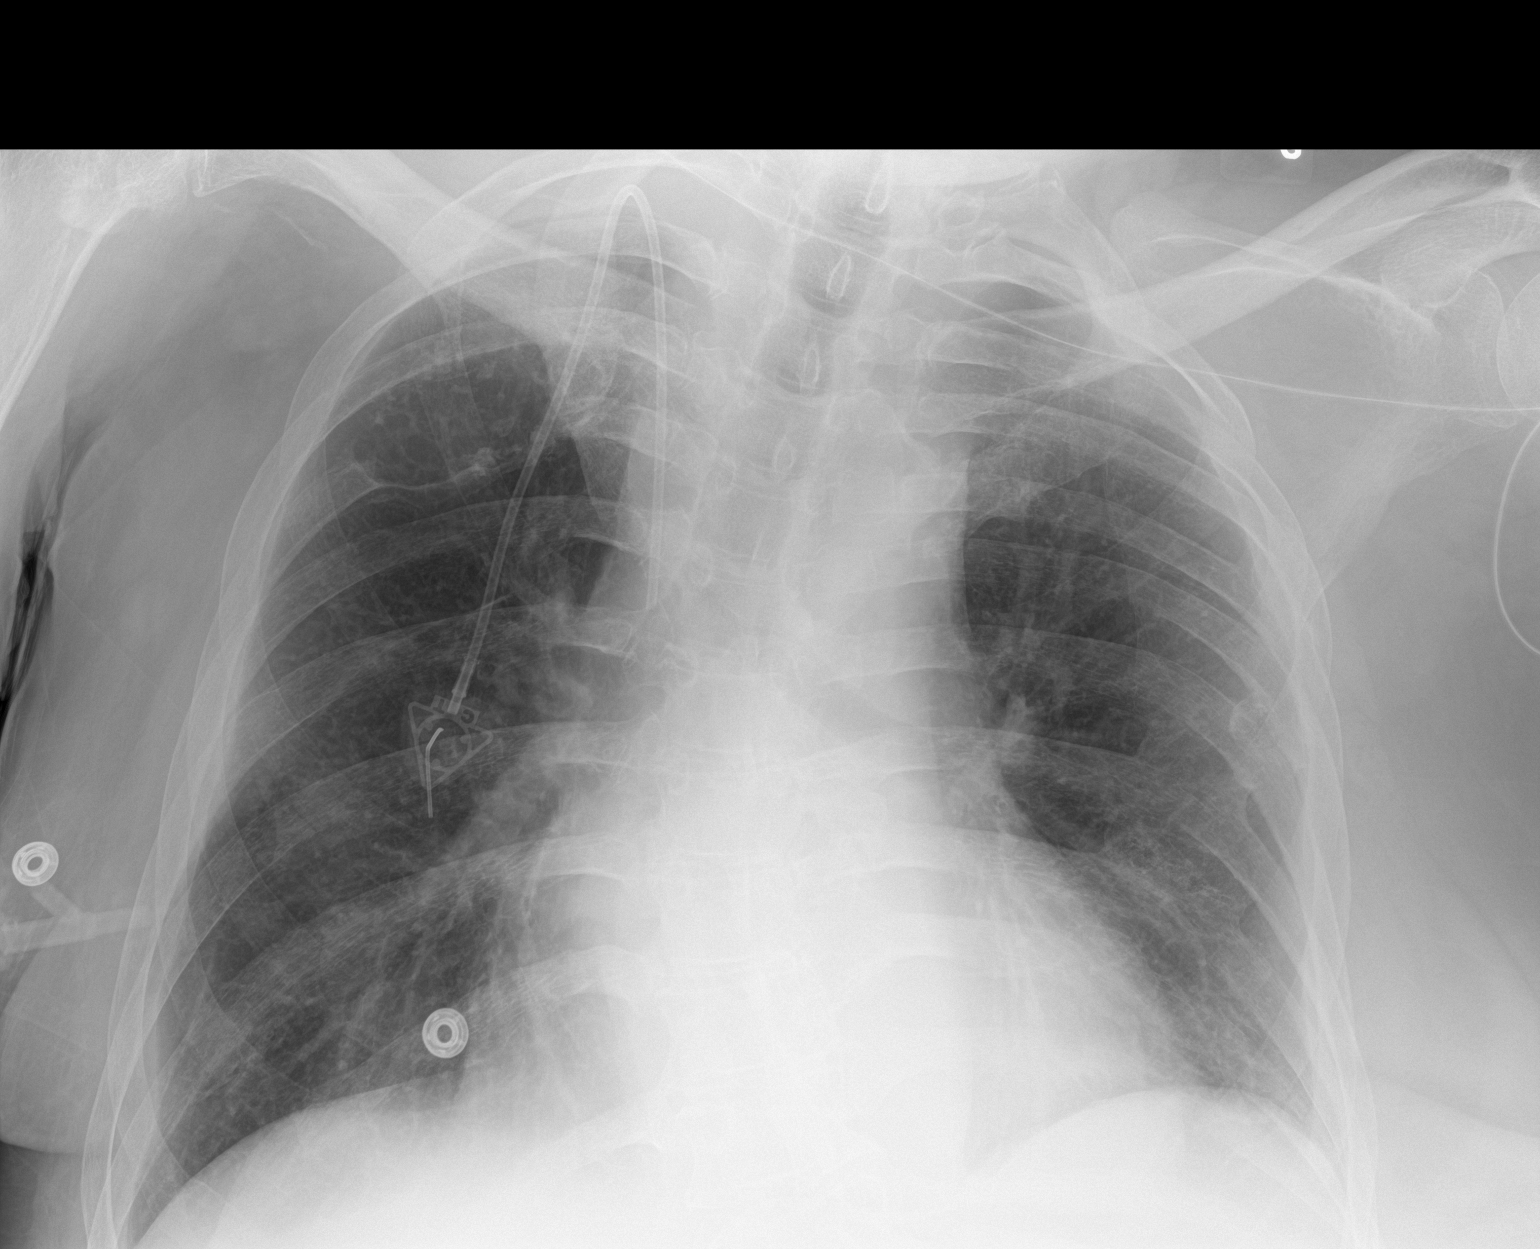

[1 of 1 positions shown; findings below may reference images not displayed]

FINDINGS: Right-sided Port-A-Cath in place. Stable cardiomegaly. Mediastinal
silhouette normal.

Lungs normally inflated. No focal infiltrates. No pulmonary edema or
pleural effusion. No pneumothorax.

No acute osseus abnormality. Expansile right fifth rib mass, stable
from previous. Remotely healed left-sided rib fractures noted.
Scoliosis noted. Postsurgical changes noted at the cervical spine.
IMPRESSION: 1. No radiographic evidence for active cardiopulmonary disease.
2. Stable expansile right fifth rib mass.

## 2019-06-16 IMAGING — MR MR SHOULDER*R* WO/W CM
5 of 10 series · 19 of 40 positions shown · IV contrast (multihance)
Comparison: None.

CLINICAL DATA: Fever, draining wound along the right shoulder.

EXAM:
MRI OF THE RIGHT SHOULDER WITHOUT AND WITH CONTRAST
TECHNIQUE: Multiplanar, multisequence MR imaging of the RIGHT shoulder was
performed before and after the administration of intravenous
contrast.
CONTRAST:  20mL MULTIHANCE GADOBENATE DIMEGLUMINE 529 MG/ML IV SOLN

[Series 2: T2 fat-sat · axial · 4.0mm · 0.27mm/px · z∈[-37,+55]mm · 4 of 20 slices shown (1 of 3)]
[im 1/20]
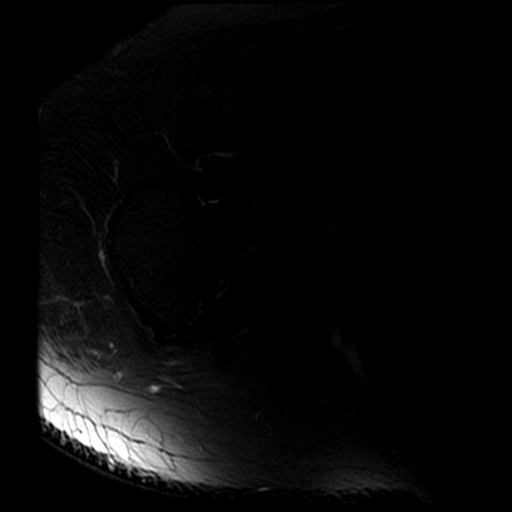
[im 7/20]
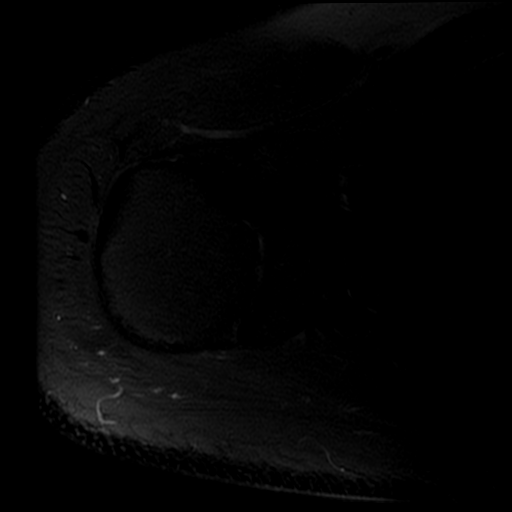
[im 13/20]
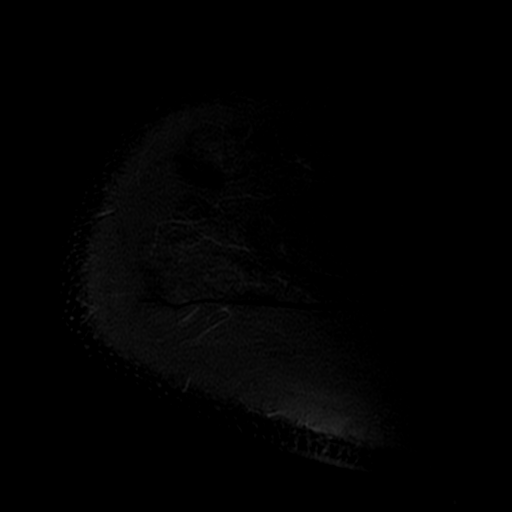
[im 20/20]
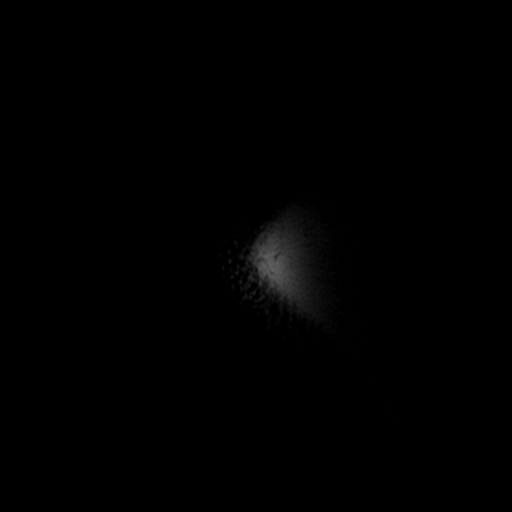

[Series 3: T1 fat-sat · axial · 4.0mm · 0.27mm/px · z∈[-37,+55]mm · 4 of 20 slices shown]
[im 1/20]
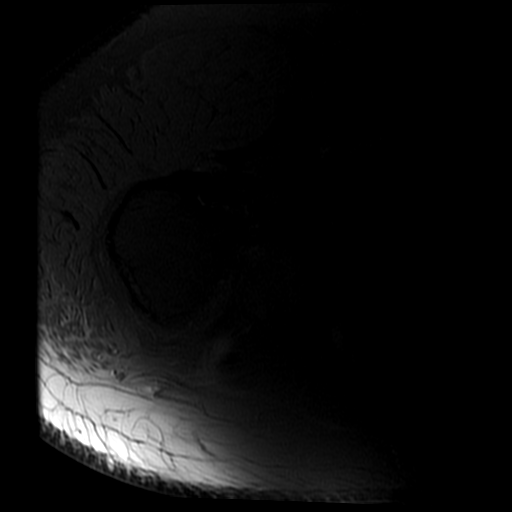
[im 7/20]
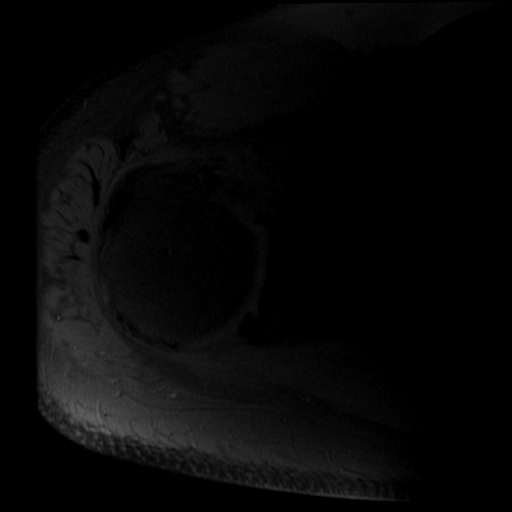
[im 13/20]
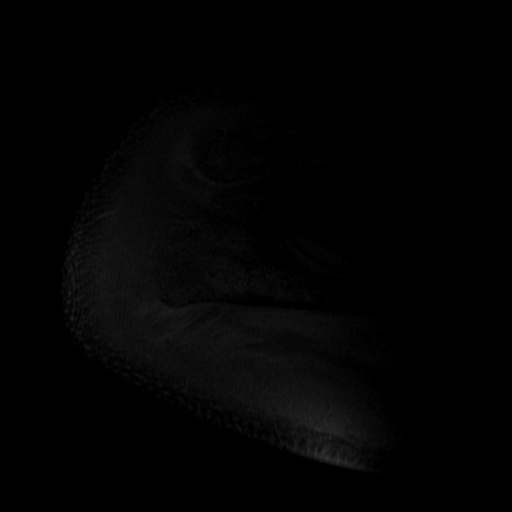
[im 20/20]
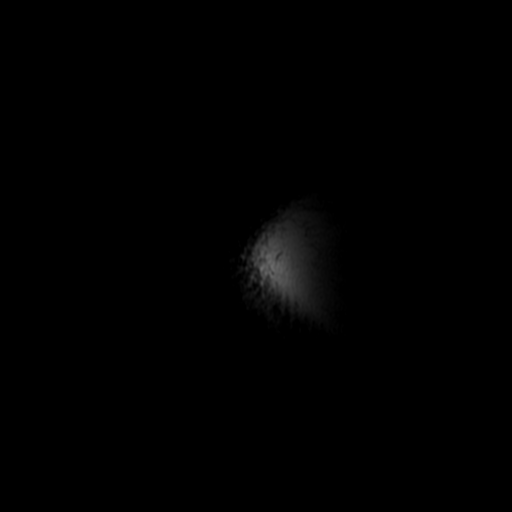

[Series 4: T1 · coronal · 4.0mm · 0.31mm/px · 3 of 20 slices shown]
[im 1/20]
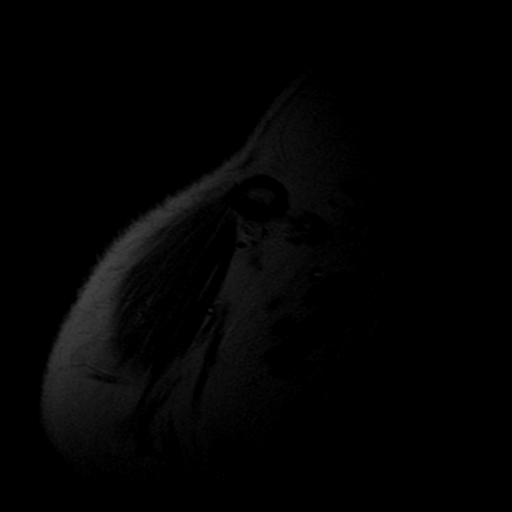
[im 7/20]
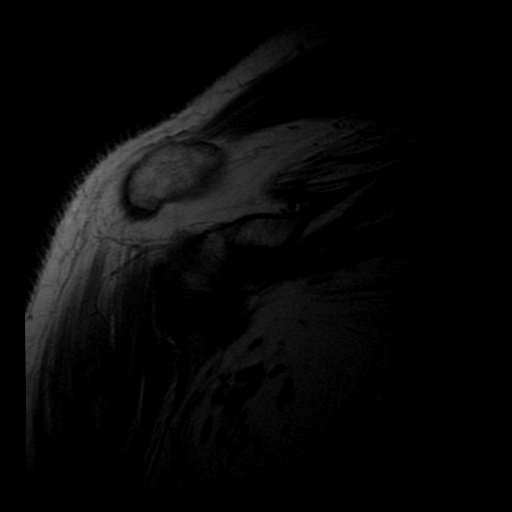
[im 13/20]
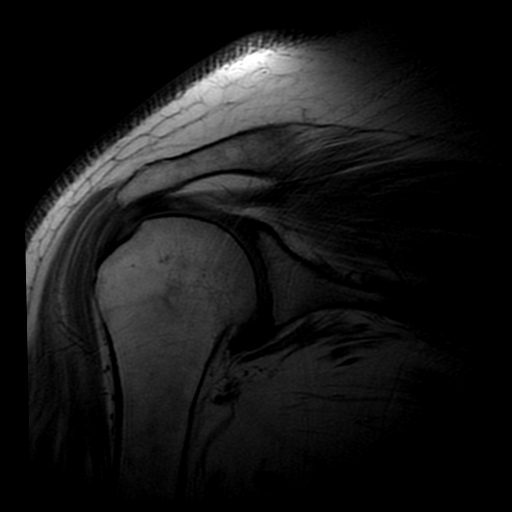

[Series 5: T2 fat-sat · coronal · 4.0mm · 0.31mm/px · 4 of 19 slices shown (2 of 3)]
[im 1/19]
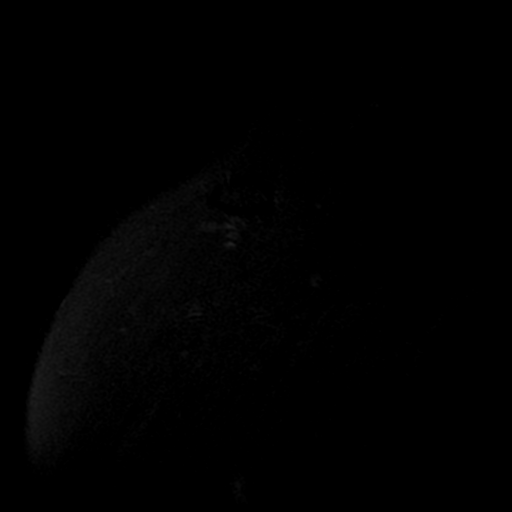
[im 7/19]
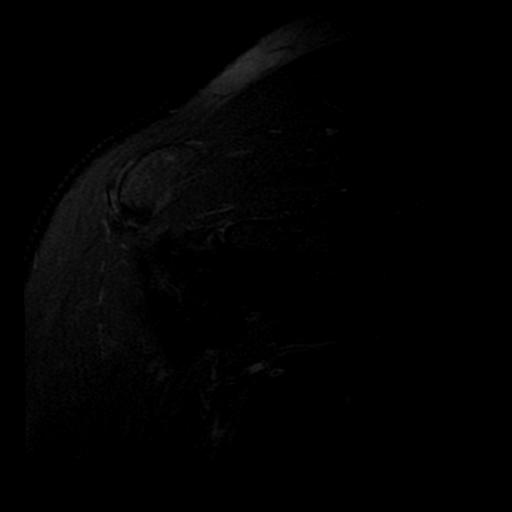
[im 13/19]
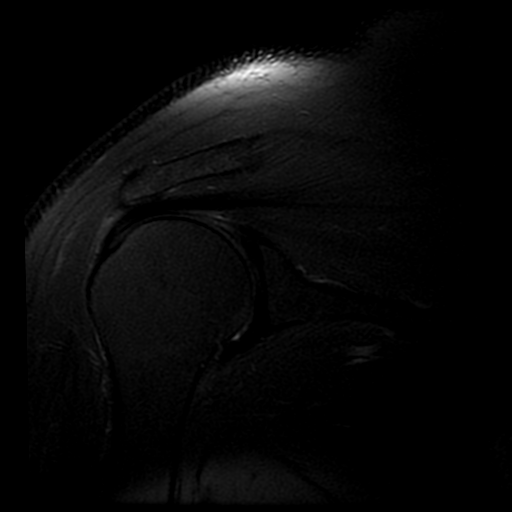
[im 19/19]
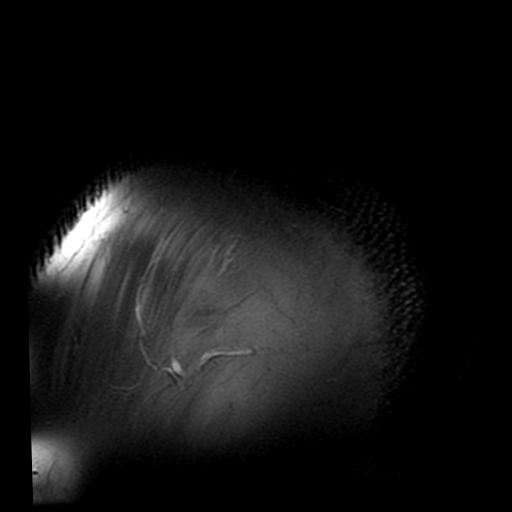

[Series 7: T2 fat-sat · sagittal · 4.0mm · 0.31mm/px · 4 of 20 slices shown (3 of 3)]
[im 1/20]
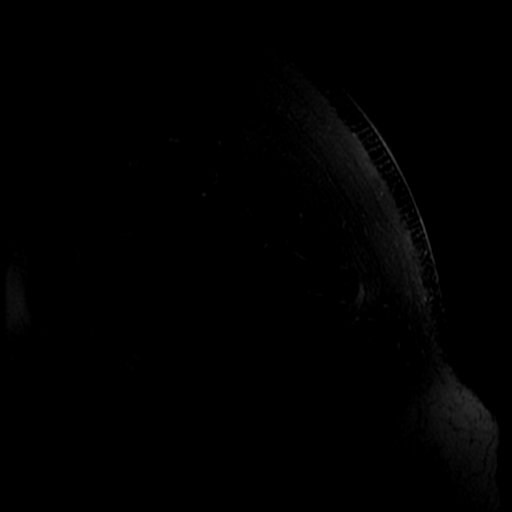
[im 7/20]
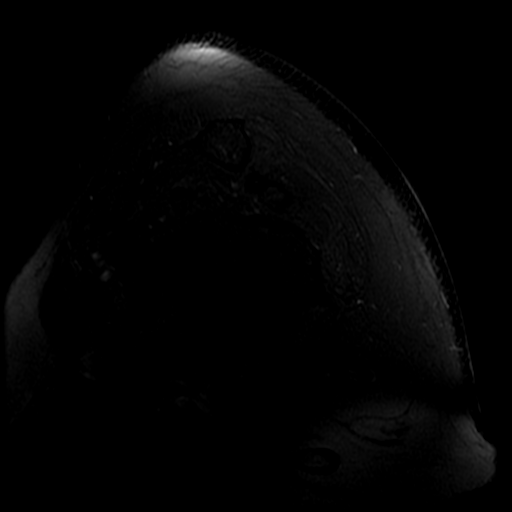
[im 13/20]
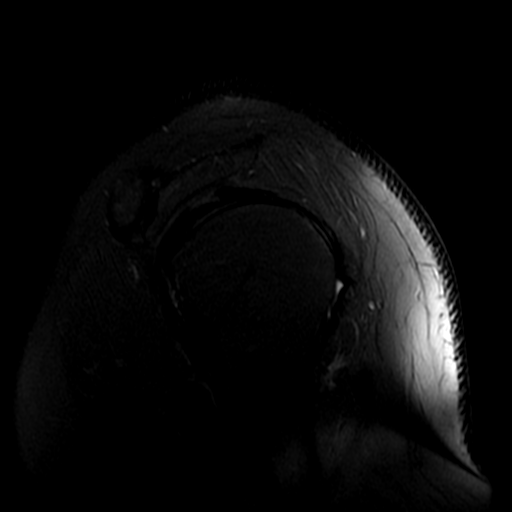
[im 20/20]
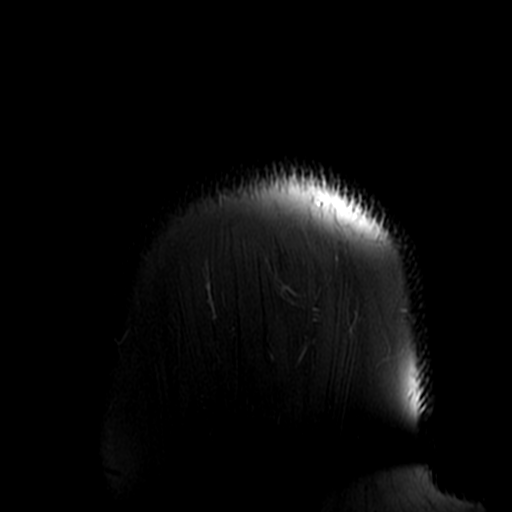

[19 of 40 positions shown; findings below may reference images not displayed]

FINDINGS: Rotator cuff: Mild tendinosis at the junction of the supraspinatus
and infraspinatus tendons. Teres minor tendon is intact.
Subscapularis tendon is intact.

Muscles: No atrophy or fatty replacement of nor abnormal signal
within, the muscles of the rotator cuff.

Biceps long head:  Intact.

Acromioclavicular Joint: Mild arthropathy of the acromioclavicular
joint. Type II acromion. No subacromial/subdeltoid bursal fluid.

Glenohumeral Joint: No joint effusion. Partial-thickness cartilage
loss of the glenohumeral joint. No synovial enhancement.

Labrum: Grossly intact, but evaluation is limited by lack of
intraarticular fluid.

Bones:  No marrow signal abnormality.  No fracture or dislocation.

Other: No fluid collection or hematoma.
IMPRESSION: 1. No MRI evidence of septic arthritis of the right shoulder.
2. Mild tendinosis of the junction of the supraspinatus and
infraspinatus tendons.
3. No drainable fluid collection to suggest an abscess.

## 2019-06-18 ENCOUNTER — Other Ambulatory Visit: Payer: Self-pay

## 2019-06-18 ENCOUNTER — Inpatient Hospital Stay (HOSPITAL_COMMUNITY)
Admission: EM | Admit: 2019-06-18 | Discharge: 2019-06-22 | DRG: 314 | Disposition: A | Discharge status: Home / Self Care | Payer: Medicare Other | Attending: Internal Medicine | Admitting: Internal Medicine

## 2019-06-18 ENCOUNTER — Emergency Department (HOSPITAL_COMMUNITY): Payer: Medicare Other

## 2019-06-18 ENCOUNTER — Encounter (HOSPITAL_COMMUNITY): Payer: Self-pay

## 2019-06-18 DIAGNOSIS — N390 Urinary tract infection, site not specified: Secondary | ICD-10-CM | POA: Diagnosis present

## 2019-06-18 DIAGNOSIS — Z452 Encounter for adjustment and management of vascular access device: Secondary | ICD-10-CM

## 2019-06-18 DIAGNOSIS — Z8701 Personal history of pneumonia (recurrent): Secondary | ICD-10-CM

## 2019-06-18 DIAGNOSIS — I9589 Other hypotension: Principal | ICD-10-CM | POA: Diagnosis present

## 2019-06-18 DIAGNOSIS — G909 Disorder of the autonomic nervous system, unspecified: Secondary | ICD-10-CM | POA: Diagnosis present

## 2019-06-18 DIAGNOSIS — L89154 Pressure ulcer of sacral region, stage 4: Secondary | ICD-10-CM | POA: Diagnosis present

## 2019-06-18 DIAGNOSIS — Z960 Presence of urogenital implants: Secondary | ICD-10-CM | POA: Diagnosis present

## 2019-06-18 DIAGNOSIS — M4628 Osteomyelitis of vertebra, sacral and sacrococcygeal region: Secondary | ICD-10-CM | POA: Diagnosis present

## 2019-06-18 DIAGNOSIS — E860 Dehydration: Secondary | ICD-10-CM | POA: Diagnosis present

## 2019-06-18 DIAGNOSIS — Z7401 Bed confinement status: Secondary | ICD-10-CM

## 2019-06-18 DIAGNOSIS — I959 Hypotension, unspecified: Secondary | ICD-10-CM | POA: Diagnosis present

## 2019-06-18 DIAGNOSIS — I313 Pericardial effusion (noninflammatory): Secondary | ICD-10-CM | POA: Diagnosis present

## 2019-06-18 DIAGNOSIS — K219 Gastro-esophageal reflux disease without esophagitis: Secondary | ICD-10-CM | POA: Diagnosis present

## 2019-06-18 DIAGNOSIS — J9610 Chronic respiratory failure, unspecified whether with hypoxia or hypercapnia: Secondary | ICD-10-CM | POA: Diagnosis present

## 2019-06-18 DIAGNOSIS — Z933 Colostomy status: Secondary | ICD-10-CM

## 2019-06-18 DIAGNOSIS — I10 Essential (primary) hypertension: Secondary | ICD-10-CM | POA: Diagnosis present

## 2019-06-18 DIAGNOSIS — G8253 Quadriplegia, C5-C7 complete: Secondary | ICD-10-CM | POA: Diagnosis present

## 2019-06-18 DIAGNOSIS — Z8711 Personal history of peptic ulcer disease: Secondary | ICD-10-CM

## 2019-06-18 DIAGNOSIS — G8250 Quadriplegia, unspecified: Secondary | ICD-10-CM | POA: Diagnosis present

## 2019-06-18 DIAGNOSIS — Z9359 Other cystostomy status: Secondary | ICD-10-CM

## 2019-06-18 DIAGNOSIS — G4733 Obstructive sleep apnea (adult) (pediatric): Secondary | ICD-10-CM | POA: Diagnosis present

## 2019-06-18 DIAGNOSIS — E876 Hypokalemia: Secondary | ICD-10-CM | POA: Diagnosis not present

## 2019-06-18 DIAGNOSIS — Z803 Family history of malignant neoplasm of breast: Secondary | ICD-10-CM

## 2019-06-18 DIAGNOSIS — Z20822 Contact with and (suspected) exposure to covid-19: Secondary | ICD-10-CM | POA: Diagnosis present

## 2019-06-18 DIAGNOSIS — Z87440 Personal history of urinary (tract) infections: Secondary | ICD-10-CM

## 2019-06-18 DIAGNOSIS — Z881 Allergy status to other antibiotic agents status: Secondary | ICD-10-CM

## 2019-06-18 DIAGNOSIS — D638 Anemia in other chronic diseases classified elsewhere: Secondary | ICD-10-CM | POA: Diagnosis present

## 2019-06-18 DIAGNOSIS — A0472 Enterocolitis due to Clostridium difficile, not specified as recurrent: Secondary | ICD-10-CM | POA: Diagnosis present

## 2019-06-18 DIAGNOSIS — A419 Sepsis, unspecified organism: Secondary | ICD-10-CM

## 2019-06-18 DIAGNOSIS — Z833 Family history of diabetes mellitus: Secondary | ICD-10-CM

## 2019-06-18 DIAGNOSIS — R64 Cachexia: Secondary | ICD-10-CM | POA: Diagnosis present

## 2019-06-18 DIAGNOSIS — Z6821 Body mass index (BMI) 21.0-21.9, adult: Secondary | ICD-10-CM

## 2019-06-18 DIAGNOSIS — R000 Tachycardia, unspecified: Secondary | ICD-10-CM | POA: Diagnosis not present

## 2019-06-18 DIAGNOSIS — Z888 Allergy status to other drugs, medicaments and biological substances status: Secondary | ICD-10-CM

## 2019-06-18 DIAGNOSIS — K3184 Gastroparesis: Secondary | ICD-10-CM | POA: Diagnosis present

## 2019-06-18 DIAGNOSIS — Z79899 Other long term (current) drug therapy: Secondary | ICD-10-CM

## 2019-06-18 LAB — URINALYSIS, ROUTINE W REFLEX MICROSCOPIC
Bilirubin Urine: NEGATIVE
Glucose, UA: NEGATIVE mg/dL
Hgb urine dipstick: NEGATIVE
Ketones, ur: 5 mg/dL — AB
Nitrite: NEGATIVE
Protein, ur: >=300 mg/dL — AB
Specific Gravity, Urine: 1.017 (ref 1.005–1.030)
pH: 8 (ref 5.0–8.0)

## 2019-06-18 LAB — CBC WITH DIFFERENTIAL/PLATELET
Abs Immature Granulocytes: 0.06 10*3/uL (ref 0.00–0.07)
Basophils Absolute: 0 10*3/uL (ref 0.0–0.1)
Basophils Relative: 0 %
Eosinophils Absolute: 0 10*3/uL (ref 0.0–0.5)
Eosinophils Relative: 0 %
HCT: 27.3 % — ABNORMAL LOW (ref 39.0–52.0)
Hemoglobin: 7.9 g/dL — ABNORMAL LOW (ref 13.0–17.0)
Immature Granulocytes: 1 %
Lymphocytes Relative: 18 %
Lymphs Abs: 1.8 10*3/uL (ref 0.7–4.0)
MCH: 23.9 pg — ABNORMAL LOW (ref 26.0–34.0)
MCHC: 28.9 g/dL — ABNORMAL LOW (ref 30.0–36.0)
MCV: 82.5 fL (ref 80.0–100.0)
Monocytes Absolute: 1.1 10*3/uL — ABNORMAL HIGH (ref 0.1–1.0)
Monocytes Relative: 11 %
Neutro Abs: 7.5 10*3/uL (ref 1.7–7.7)
Neutrophils Relative %: 70 %
Platelets: 487 10*3/uL — ABNORMAL HIGH (ref 150–400)
RBC: 3.31 MIL/uL — ABNORMAL LOW (ref 4.22–5.81)
RDW: 16.3 % — ABNORMAL HIGH (ref 11.5–15.5)
WBC: 10.5 10*3/uL (ref 4.0–10.5)
nRBC: 0 % (ref 0.0–0.2)

## 2019-06-18 LAB — COMPREHENSIVE METABOLIC PANEL
ALT: 15 U/L (ref 0–44)
AST: 14 U/L — ABNORMAL LOW (ref 15–41)
Albumin: 1.5 g/dL — ABNORMAL LOW (ref 3.5–5.0)
Alkaline Phosphatase: 67 U/L (ref 38–126)
Anion gap: 13 (ref 5–15)
BUN: 31 mg/dL — ABNORMAL HIGH (ref 6–20)
CO2: 17 mmol/L — ABNORMAL LOW (ref 22–32)
Calcium: 8.4 mg/dL — ABNORMAL LOW (ref 8.9–10.3)
Chloride: 111 mmol/L (ref 98–111)
Creatinine, Ser: 0.42 mg/dL — ABNORMAL LOW (ref 0.61–1.24)
GFR calc Af Amer: >60 mL/min (ref >60)
GFR calc non Af Amer: >60 mL/min (ref >60)
Glucose, Bld: 108 mg/dL — ABNORMAL HIGH (ref 70–99)
Potassium: 3 mmol/L — ABNORMAL LOW (ref 3.5–5.1)
Sodium: 141 mmol/L (ref 135–145)
Total Bilirubin: 0.8 mg/dL (ref 0.3–1.2)
Total Protein: 5.4 g/dL — ABNORMAL LOW (ref 6.5–8.1)

## 2019-06-18 LAB — PROTIME-INR
INR: 1.1 (ref 0.8–1.2)
Prothrombin Time: 14.6 seconds (ref 11.4–15.2)

## 2019-06-18 LAB — LACTIC ACID, PLASMA: Lactic Acid, Venous: 0.7 mmol/L (ref 0.5–1.9)

## 2019-06-18 LAB — APTT: aPTT: 32 seconds (ref 24–36)

## 2019-06-18 MED ORDER — POTASSIUM CHLORIDE 10 MEQ/100ML IV SOLN
10.0000 meq | INTRAVENOUS | Status: AC
  Administered 2019-06-19: 01:00:00 10 meq via INTRAVENOUS
  Filled 2019-06-18: qty 100

## 2019-06-18 MED ORDER — SODIUM CHLORIDE 0.9 % IV SOLN
2.0000 g | Freq: Three times a day (TID) | INTRAVENOUS | Status: DC
  Administered 2019-06-19 – 2019-06-21 (×6): 2 g via INTRAVENOUS
  Filled 2019-06-18 (×8): qty 2

## 2019-06-18 MED ORDER — SODIUM CHLORIDE 0.9 % IV SOLN
2.0000 g | Freq: Once | INTRAVENOUS | Status: AC
  Administered 2019-06-19: 2 g via INTRAVENOUS
  Filled 2019-06-18 (×2): qty 2

## 2019-06-18 MED ORDER — SODIUM CHLORIDE 0.9 % IV BOLUS
1000.0000 mL | Freq: Once | INTRAVENOUS | Status: AC
  Administered 2019-06-19: 1000 mL via INTRAVENOUS

## 2019-06-18 MED ORDER — ONDANSETRON HCL 4 MG/2ML IJ SOLN
4.0000 mg | Freq: Once | INTRAMUSCULAR | Status: AC
  Administered 2019-06-19: 01:00:00 4 mg via INTRAVENOUS
  Filled 2019-06-18: qty 2
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
## 2019-06-18 NOTE — Progress Notes (Signed)
Pharmacy Antibiotic Note  [name] [name] is a 54 y.o. male admitted on 06/18/2019 with UTI and hx of pseudomonas UTI.  Pharmacy has been consulted for Cefepime dosing.  Hx quadriplegia SCr appears somewhat elevated from baseline at 0.42.  Plan: Cefepime 2g IV every 8 hours Monitor renal function, Cx to narrow    Temp (24hrs), Avg:97.9 F (36.6 C), Min:97.9 F (36.6 C), Max:97.9 F (36.6 C)  No results for input(s): WBC, CREATININE, LATICACIDVEN, VANCOTROUGH, VANCOPEAK, VANCORANDOM, GENTTROUGH, GENTPEAK, GENTRANDOM, TOBRATROUGH, TOBRAPEAK, TOBRARND, AMIKACINPEAK, AMIKACINTROU, AMIKACIN in the last 168 hours.  CrCl cannot be calculated (Patient's most recent lab result is older than the maximum 21 days allowed.).    Allergies  Allergen Reactions  . Ferumoxytol Anaphylaxis and Other (See Comments)    (Feraheme) = "SYNCOPE; patient tolerated Venofer 10/01/18 s rxn"   . Oxybutynin Other (See Comments)    Hallucinations    . Vancomycin Other (See Comments)    ARF 05-2016 -- affects kidneys     Bertis Ruddy, PharmD Clinical Pharmacist Please check AMION for all [name] numbers 06/18/2019 6:37 PM

## 2019-06-18 NOTE — ED Triage Notes (Signed)
Per [facility] pt from home, quadriplegic, presents with a chronic indwelling foley catheter, pt feeling nauseous and difficulty urinating, foley with dark colored urine    EMS unable to obtain a BP, pt reports that is normal for him   97.9   CBG 125 HR 90 RR 16-18

## 2019-06-19 ENCOUNTER — Inpatient Hospital Stay: Payer: Self-pay

## 2019-06-19 ENCOUNTER — Encounter (HOSPITAL_COMMUNITY): Payer: Self-pay | Admitting: Internal Medicine

## 2019-06-19 ENCOUNTER — Inpatient Hospital Stay (HOSPITAL_COMMUNITY): Payer: Medicare Other

## 2019-06-19 DIAGNOSIS — Z933 Colostomy status: Secondary | ICD-10-CM | POA: Diagnosis not present

## 2019-06-19 DIAGNOSIS — I959 Hypotension, unspecified: Secondary | ICD-10-CM

## 2019-06-19 DIAGNOSIS — R64 Cachexia: Secondary | ICD-10-CM | POA: Diagnosis present

## 2019-06-19 DIAGNOSIS — Z9359 Other cystostomy status: Secondary | ICD-10-CM

## 2019-06-19 DIAGNOSIS — I313 Pericardial effusion (noninflammatory): Secondary | ICD-10-CM | POA: Diagnosis present

## 2019-06-19 DIAGNOSIS — G8253 Quadriplegia, C5-C7 complete: Secondary | ICD-10-CM | POA: Diagnosis present

## 2019-06-19 DIAGNOSIS — K219 Gastro-esophageal reflux disease without esophagitis: Secondary | ICD-10-CM | POA: Diagnosis present

## 2019-06-19 DIAGNOSIS — R6510 Systemic inflammatory response syndrome (SIRS) of non-infectious origin without acute organ dysfunction: Secondary | ICD-10-CM

## 2019-06-19 DIAGNOSIS — Z20822 Contact with and (suspected) exposure to covid-19: Secondary | ICD-10-CM | POA: Diagnosis present

## 2019-06-19 DIAGNOSIS — Z888 Allergy status to other drugs, medicaments and biological substances status: Secondary | ICD-10-CM | POA: Diagnosis not present

## 2019-06-19 DIAGNOSIS — E876 Hypokalemia: Secondary | ICD-10-CM | POA: Diagnosis present

## 2019-06-19 DIAGNOSIS — R000 Tachycardia, unspecified: Secondary | ICD-10-CM | POA: Diagnosis not present

## 2019-06-19 DIAGNOSIS — Z833 Family history of diabetes mellitus: Secondary | ICD-10-CM | POA: Diagnosis not present

## 2019-06-19 DIAGNOSIS — Z87440 Personal history of urinary (tract) infections: Secondary | ICD-10-CM | POA: Diagnosis not present

## 2019-06-19 DIAGNOSIS — G903 Multi-system degeneration of the autonomic nervous system: Secondary | ICD-10-CM | POA: Diagnosis not present

## 2019-06-19 DIAGNOSIS — Z79899 Other long term (current) drug therapy: Secondary | ICD-10-CM | POA: Diagnosis not present

## 2019-06-19 DIAGNOSIS — N3000 Acute cystitis without hematuria: Secondary | ICD-10-CM

## 2019-06-19 DIAGNOSIS — D638 Anemia in other chronic diseases classified elsewhere: Secondary | ICD-10-CM | POA: Diagnosis present

## 2019-06-19 DIAGNOSIS — L89154 Pressure ulcer of sacral region, stage 4: Secondary | ICD-10-CM | POA: Diagnosis present

## 2019-06-19 DIAGNOSIS — A0472 Enterocolitis due to Clostridium difficile, not specified as recurrent: Secondary | ICD-10-CM

## 2019-06-19 DIAGNOSIS — G909 Disorder of the autonomic nervous system, unspecified: Secondary | ICD-10-CM | POA: Diagnosis present

## 2019-06-19 DIAGNOSIS — G4733 Obstructive sleep apnea (adult) (pediatric): Secondary | ICD-10-CM | POA: Diagnosis present

## 2019-06-19 DIAGNOSIS — M4628 Osteomyelitis of vertebra, sacral and sacrococcygeal region: Secondary | ICD-10-CM | POA: Diagnosis present

## 2019-06-19 DIAGNOSIS — Z803 Family history of malignant neoplasm of breast: Secondary | ICD-10-CM | POA: Diagnosis not present

## 2019-06-19 DIAGNOSIS — I9589 Other hypotension: Secondary | ICD-10-CM | POA: Diagnosis present

## 2019-06-19 DIAGNOSIS — I10 Essential (primary) hypertension: Secondary | ICD-10-CM | POA: Diagnosis present

## 2019-06-19 DIAGNOSIS — Z6821 Body mass index (BMI) 21.0-21.9, adult: Secondary | ICD-10-CM | POA: Diagnosis not present

## 2019-06-19 DIAGNOSIS — E860 Dehydration: Secondary | ICD-10-CM | POA: Diagnosis present

## 2019-06-19 DIAGNOSIS — Z881 Allergy status to other antibiotic agents status: Secondary | ICD-10-CM | POA: Diagnosis not present

## 2019-06-19 DIAGNOSIS — J9610 Chronic respiratory failure, unspecified whether with hypoxia or hypercapnia: Secondary | ICD-10-CM | POA: Diagnosis present

## 2019-06-19 LAB — BASIC METABOLIC PANEL
Anion gap: 11 (ref 5–15)
BUN: 31 mg/dL — ABNORMAL HIGH (ref 6–20)
CO2: 19 mmol/L — ABNORMAL LOW (ref 22–32)
Calcium: 8.3 mg/dL — ABNORMAL LOW (ref 8.9–10.3)
Chloride: 114 mmol/L — ABNORMAL HIGH (ref 98–111)
Creatinine, Ser: 0.54 mg/dL — ABNORMAL LOW (ref 0.61–1.24)
GFR calc Af Amer: >60 mL/min (ref >60)
GFR calc non Af Amer: >60 mL/min (ref >60)
Glucose, Bld: 87 mg/dL (ref 70–99)
Potassium: 2.8 mmol/L — ABNORMAL LOW (ref 3.5–5.1)
Sodium: 144 mmol/L (ref 135–145)

## 2019-06-19 LAB — CBC
HCT: 25.2 % — ABNORMAL LOW (ref 39.0–52.0)
Hemoglobin: 7.2 g/dL — ABNORMAL LOW (ref 13.0–17.0)
MCH: 23.5 pg — ABNORMAL LOW (ref 26.0–34.0)
MCHC: 28.6 g/dL — ABNORMAL LOW (ref 30.0–36.0)
MCV: 82.1 fL (ref 80.0–100.0)
Platelets: 460 10*3/uL — ABNORMAL HIGH (ref 150–400)
RBC: 3.07 MIL/uL — ABNORMAL LOW (ref 4.22–5.81)
RDW: 16.3 % — ABNORMAL HIGH (ref 11.5–15.5)
WBC: 7.7 10*3/uL (ref 4.0–10.5)
nRBC: 0 % (ref 0.0–0.2)

## 2019-06-19 LAB — MAGNESIUM: Magnesium: 1.2 mg/dL — ABNORMAL LOW (ref 1.7–2.4)

## 2019-06-19 LAB — ECHOCARDIOGRAM LIMITED

## 2019-06-19 LAB — SARS CORONAVIRUS 2 (TAT 6-24 HRS): SARS Coronavirus 2: NEGATIVE

## 2019-06-19 LAB — URINE CULTURE

## 2019-06-19 LAB — TSH: TSH: 0.748 u[IU]/mL (ref 0.350–4.500)

## 2019-06-19 LAB — CORTISOL: Cortisol, Plasma: 18.4 ug/dL

## 2019-06-19 LAB — MRSA PCR SCREENING: MRSA by PCR: POSITIVE — AB

## 2019-06-19 LAB — PHOSPHORUS: Phosphorus: 3.3 mg/dL (ref 2.5–4.6)

## 2019-06-19 MED ORDER — POTASSIUM CHLORIDE IN NACL 20-0.9 MEQ/L-% IV SOLN
INTRAVENOUS | Status: DC
  Filled 2019-06-19 (×5): qty 1000

## 2019-06-19 MED ORDER — POTASSIUM CHLORIDE CRYS ER 20 MEQ PO TBCR
40.0000 meq | EXTENDED_RELEASE_TABLET | Freq: Two times a day (BID) | ORAL | Status: AC
  Administered 2019-06-19 – 2019-06-20 (×3): 40 meq via ORAL
  Filled 2019-06-19 (×3): qty 2

## 2019-06-19 MED ORDER — ACETAMINOPHEN 325 MG PO TABS
650.0000 mg | ORAL_TABLET | Freq: Four times a day (QID) | ORAL | Status: DC | PRN
  Administered 2019-06-21: 17:00:00 650 mg via ORAL
  Filled 2019-06-19: qty 2

## 2019-06-19 MED ORDER — FERROUS SULFATE 325 (65 FE) MG PO TABS
325.0000 mg | ORAL_TABLET | Freq: Every day | ORAL | Status: DC
  Administered 2019-06-20 – 2019-06-22 (×3): 325 mg via ORAL
  Filled 2019-06-19 (×3): qty 1

## 2019-06-19 MED ORDER — ENSURE MAX PROTEIN PO LIQD
11.0000 [oz_av] | Freq: Every day | ORAL | Status: DC
  Administered 2019-06-19: 17:00:00 237 mL via ORAL
  Administered 2019-06-20: 09:00:00 11 [oz_av] via ORAL
  Filled 2019-06-19 (×2): qty 330

## 2019-06-19 MED ORDER — SODIUM CHLORIDE 0.9% FLUSH: 10.0000 mL | INTRAVENOUS | Status: DC | PRN

## 2019-06-19 MED ORDER — CHLORHEXIDINE GLUCONATE CLOTH 2 % EX PADS
6.0000 | MEDICATED_PAD | Freq: Every day | CUTANEOUS | Status: DC
  Administered 2019-06-19 – 2019-06-22 (×4): 6 via TOPICAL

## 2019-06-19 MED ORDER — VANCOMYCIN 50 MG/ML ORAL SOLUTION
125.0000 mg | Freq: Four times a day (QID) | ORAL | Status: DC
  Administered 2019-06-19 – 2019-06-22 (×13): 125 mg via ORAL
  Filled 2019-06-19 (×15): qty 2.5

## 2019-06-19 MED ORDER — POTASSIUM CHLORIDE 10 MEQ/100ML IV SOLN: 10.0000 meq | INTRAVENOUS | Status: AC

## 2019-06-19 MED ORDER — ASCORBIC ACID 500 MG PO TABS
500.0000 mg | ORAL_TABLET | Freq: Every day | ORAL | Status: DC
  Administered 2019-06-19 – 2019-06-22 (×4): 500 mg via ORAL
  Filled 2019-06-19 (×4): qty 1

## 2019-06-19 MED ORDER — SODIUM CHLORIDE 0.9% FLUSH
10.0000 mL | Freq: Two times a day (BID) | INTRAVENOUS | Status: DC
  Administered 2019-06-19 – 2019-06-22 (×6): 10 mL

## 2019-06-19 MED ORDER — LIDOCAINE HCL 1 % IJ SOLN
INTRAMUSCULAR | Status: AC | PRN
  Administered 2019-06-19: 5 mL

## 2019-06-19 MED ORDER — MAGNESIUM OXIDE 400 (241.3 MG) MG PO TABS
400.0000 mg | ORAL_TABLET | Freq: Two times a day (BID) | ORAL | Status: DC
  Administered 2019-06-19 – 2019-06-21 (×4): 400 mg via ORAL
  Filled 2019-06-19 (×4): qty 1

## 2019-06-19 MED ORDER — LIDOCAINE HCL 1 % IJ SOLN
INTRAMUSCULAR | Status: AC
  Filled 2019-06-19: qty 20

## 2019-06-19 MED ORDER — BACLOFEN 10 MG PO TABS
20.0000 mg | ORAL_TABLET | Freq: Two times a day (BID) | ORAL | Status: DC
  Administered 2019-06-19 – 2019-06-22 (×6): 20 mg via ORAL
  Filled 2019-06-19 (×6): qty 2

## 2019-06-19 MED ORDER — ADULT MULTIVITAMIN W/MINERALS CH
1.0000 | ORAL_TABLET | Freq: Every day | ORAL | Status: DC
  Administered 2019-06-20 – 2019-06-22 (×3): 1 via ORAL
  Filled 2019-06-19 (×3): qty 1

## 2019-06-19 MED ORDER — ALBUTEROL SULFATE (2.5 MG/3ML) 0.083% IN NEBU: 2.5000 mg | INHALATION_SOLUTION | RESPIRATORY_TRACT | Status: DC | PRN

## 2019-06-19 MED ORDER — LEVALBUTEROL TARTRATE 45 MCG/ACT IN AERO: 1.0000 | INHALATION_SPRAY | RESPIRATORY_TRACT | Status: DC | PRN

## 2019-06-19 MED ORDER — PANTOPRAZOLE SODIUM 40 MG PO TBEC
40.0000 mg | DELAYED_RELEASE_TABLET | Freq: Every day | ORAL | Status: DC
  Administered 2019-06-20 – 2019-06-22 (×3): 40 mg via ORAL
  Filled 2019-06-19 (×3): qty 1

## 2019-06-19 MED ORDER — MAGNESIUM SULFATE 2 GM/50ML IV SOLN: 2.0000 g | Freq: Once | INTRAVENOUS | Status: DC

## 2019-06-19 MED ORDER — MIDODRINE HCL 5 MG PO TABS
10.0000 mg | ORAL_TABLET | Freq: Three times a day (TID) | ORAL | Status: DC
  Administered 2019-06-19 – 2019-06-22 (×12): 10 mg via ORAL
  Filled 2019-06-19 (×12): qty 2

## 2019-06-19 MED ORDER — ZINC 50 MG PO TABS: 50.0000 mg | ORAL_TABLET | Freq: Two times a day (BID) | ORAL | Status: DC

## 2019-06-19 MED ORDER — METOCLOPRAMIDE HCL 10 MG PO TABS
10.0000 mg | ORAL_TABLET | Freq: Three times a day (TID) | ORAL | Status: DC
  Administered 2019-06-19 – 2019-06-22 (×9): 10 mg via ORAL
  Filled 2019-06-19 (×10): qty 1

## 2019-06-19 MED ORDER — ENOXAPARIN SODIUM 40 MG/0.4ML ~~LOC~~ SOLN
40.0000 mg | Freq: Every day | SUBCUTANEOUS | Status: DC
  Administered 2019-06-19 – 2019-06-22 (×4): 40 mg via SUBCUTANEOUS
  Filled 2019-06-19 (×4): qty 0.4

## 2019-06-19 MED ORDER — FESOTERODINE FUMARATE ER 8 MG PO TB24
8.0000 mg | ORAL_TABLET | Freq: Every day | ORAL | Status: DC
  Administered 2019-06-19 – 2019-06-22 (×4): 8 mg via ORAL
  Filled 2019-06-19 (×4): qty 1
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
## 2019-06-19 NOTE — Consult Note (Signed)
WOC Nurse Consult Note: Reason for Consult:Patient known to our team from previous admissions.  Seen today for skin and wound assessment, wound measurements and provision of ostomy supplies. Wound type:Pressure Pressure Injury POA: Yes Measurement: Left trochanter:  5cm x 4cm x 0.2cm 100% pink, moist Right costal margin: 3cm x 3cm Unstageable (100% nonviable) Right scapula: 3cm x 3cm x 0.2cm 100% red Right lateral chest: 9cm x 8cm x 0.4cm with undermining at 6 o'clock measuring 0.5cm Right thoracic area 2.5cm round 100% red Right flank: 1.5cm x 4cm  With 50% nonviable yellow tissue and 50% red, moist tissue Sacrum/buttocks/ischial tuberosities: 26cm x 30cm x 0.4cm  75% red, 25% nonviable Right medial heel DTPI: 4cm x 2cm Left foot, 1st met head: 1cm x 3.5cm Unstageable (yellow nonviable tissue , 100%) Wound bed:As noted above Drainage (amount, consistency, odor) Large amounts of serous to light yellow exudate consistent with autolytically debriding nonviable tissue. Periwound: dry, intact with evidence of previous wound healing. Dressing procedure/placement/frequency:Air mattress provided as well as bilateral Prevalon boots. Topical care orders for daily care with silver hydrofiber dressings (Aquacel Advantage) topped with ABDs and secured with Medipore tape are provided. Silicone foam dressings to the right medial heel and left foot, 1st metatarsal head are to be applied. Turning and repositioning is in place, patient prefers positioning in the supine and left sided positions.  If more aggressive wound care is desired, please consult with Plastics.  Patient has seen [name] in the past.  [place] Nurse ostomy consult note Stoma type/location: LLQ Colosotmy Stomal assessment/size: Notseen today except through pouch. Last measurement was 1 and 5/8 inches.  Peristomal assessment: Not seen today. Pouch intact. Treatment options for stomal/peristomal skin: Skin barrier ring Output: soft brown  stool  Ostomy pouching: 2pc. 2 and 3/4 inch ostomy pouching system with skin barrier ring. Pouch is [employer], Skin barrier is Kellie Simmering # 2 and ostomy barrier ring is Kellie Simmering # [phone]  Education provided: None. Patient does not perform self care Enrolled patient in [place] program: No. Patient is established with a provider.  [place] nursing team will not follow, but will remain available to this patient, the nursing and medical teams.  Please re-consult if needed. Thanks, Maudie Flakes, MSN, RN, [place], Arther Abbott  Pager# [phone]

## 2019-06-19 NOTE — ED Notes (Addendum)
Notified by IV team that they are unable to place PICC. It will need to be placed in IR not by [place]. APP, Sharlet [place] notified.

## 2019-06-19 NOTE — Progress Notes (Signed)
PICC order noted. Denyse Dago RN notified to contact MD to cancel order for IV Team placement and to change order to IR placement due to poor peripheral vasculature and vein sclerosis from previous PICCs.

## 2019-06-19 NOTE — ED Notes (Signed)
IV medications infusing per MAR. Tolerating well. No distress noted.

## 2019-06-19 NOTE — Procedures (Signed)
Successful placement of double lumen PICC line to right brachial vein. Length 39 cm Tip at lower SVC/RA PICC capped No complications Ready for use.  EBL < 5 mL   Ascencion Dike PA-C 06/19/2019 8:43 AM

## 2019-06-19 NOTE — Progress Notes (Signed)
  Echocardiogram 2D Echocardiogram has been performed.  [name] [name] 06/19/2019, 1:34 PM

## 2019-06-19 NOTE — ED Notes (Signed)
RN paged APP floor coverage about pt's bp's MAP is between is 60-70. RN waiting for response. No IV access is still available.

## 2019-06-19 NOTE — ED Notes (Signed)
IV attempt at this time. Unsuccessful.

## 2019-06-19 NOTE — ED Notes (Addendum)
This ED tech fed pt breakfast. Pt consumed 75% of meal. Pt declined bed bath @ this moment.

## 2019-06-19 NOTE — ED Notes (Signed)
Pt had left midline removed by IV therapy due to it being clotted. IV therapy attempted a midline on the right basilic but unsuccessful. APP floor coverage contacted about IV access due to SBP 70-80 and told this RN "Critical care will not come over to obtain access and ED attending will have to obtain it". Charge RN made aware. RN in handoff made aware.

## 2019-06-19 NOTE — ED Notes (Signed)
Resumed care of PT. Previous RN reported that midline clotted off and IV access has been attempted numerous times by RN's and IV team. Per previous RN MD aware.

## 2019-06-20 LAB — COMPREHENSIVE METABOLIC PANEL
ALT: 13 U/L (ref 0–44)
AST: 13 U/L — ABNORMAL LOW (ref 15–41)
Albumin: 1.3 g/dL — ABNORMAL LOW (ref 3.5–5.0)
Alkaline Phosphatase: 72 U/L (ref 38–126)
Anion gap: 6 (ref 5–15)
BUN: 28 mg/dL — ABNORMAL HIGH (ref 6–20)
CO2: 22 mmol/L (ref 22–32)
Calcium: 7.8 mg/dL — ABNORMAL LOW (ref 8.9–10.3)
Chloride: 114 mmol/L — ABNORMAL HIGH (ref 98–111)
Creatinine, Ser: 0.54 mg/dL — ABNORMAL LOW (ref 0.61–1.24)
GFR calc Af Amer: >60 mL/min (ref >60)
GFR calc non Af Amer: >60 mL/min (ref >60)
Glucose, Bld: 122 mg/dL — ABNORMAL HIGH (ref 70–99)
Potassium: 3.6 mmol/L (ref 3.5–5.1)
Sodium: 142 mmol/L (ref 135–145)
Total Bilirubin: 0.3 mg/dL (ref 0.3–1.2)
Total Protein: 4.8 g/dL — ABNORMAL LOW (ref 6.5–8.1)

## 2019-06-20 LAB — CBC WITH DIFFERENTIAL/PLATELET
Abs Immature Granulocytes: 0.05 10*3/uL (ref 0.00–0.07)
Basophils Absolute: 0 10*3/uL (ref 0.0–0.1)
Basophils Relative: 0 %
Eosinophils Absolute: 0 10*3/uL (ref 0.0–0.5)
Eosinophils Relative: 1 %
HCT: 23.7 % — ABNORMAL LOW (ref 39.0–52.0)
Hemoglobin: 6.7 g/dL — CL (ref 13.0–17.0)
Immature Granulocytes: 1 %
Lymphocytes Relative: 30 %
Lymphs Abs: 1.9 10*3/uL (ref 0.7–4.0)
MCH: 23.3 pg — ABNORMAL LOW (ref 26.0–34.0)
MCHC: 28.3 g/dL — ABNORMAL LOW (ref 30.0–36.0)
MCV: 82.3 fL (ref 80.0–100.0)
Monocytes Absolute: 0.8 10*3/uL (ref 0.1–1.0)
Monocytes Relative: 13 %
Neutro Abs: 3.5 10*3/uL (ref 1.7–7.7)
Neutrophils Relative %: 55 %
Platelets: 422 10*3/uL — ABNORMAL HIGH (ref 150–400)
RBC: 2.88 MIL/uL — ABNORMAL LOW (ref 4.22–5.81)
RDW: 16.2 % — ABNORMAL HIGH (ref 11.5–15.5)
WBC: 6.4 10*3/uL (ref 4.0–10.5)
nRBC: 0 % (ref 0.0–0.2)

## 2019-06-20 LAB — URINE CULTURE: Culture: NO GROWTH

## 2019-06-20 LAB — MAGNESIUM: Magnesium: 1.2 mg/dL — ABNORMAL LOW (ref 1.7–2.4)

## 2019-06-20 LAB — PREPARE RBC (CROSSMATCH)

## 2019-06-20 LAB — PHOSPHORUS: Phosphorus: 2.8 mg/dL (ref 2.5–4.6)

## 2019-06-20 MED ORDER — SODIUM CHLORIDE 0.9% IV SOLUTION: Freq: Once | INTRAVENOUS | Status: AC

## 2019-06-20 MED ORDER — MAGNESIUM SULFATE 2 GM/50ML IV SOLN
2.0000 g | Freq: Once | INTRAVENOUS | Status: AC
  Administered 2019-06-20: 2 g via INTRAVENOUS
  Filled 2019-06-20: qty 50

## 2019-06-20 MED ORDER — ENSURE ENLIVE PO LIQD
237.0000 mL | Freq: Two times a day (BID) | ORAL | Status: DC
  Administered 2019-06-20 – 2019-06-21 (×3): 237 mL via ORAL

## 2019-06-20 MED ORDER — PROMETHAZINE HCL 25 MG/ML IJ SOLN
12.5000 mg | Freq: Four times a day (QID) | INTRAMUSCULAR | Status: DC | PRN
  Administered 2019-06-21: 15:00:00 12.5 mg via INTRAVENOUS
  Filled 2019-06-20 (×2): qty 1

## 2019-06-20 MED ORDER — PRO-STAT SUGAR FREE PO LIQD
30.0000 mL | Freq: Two times a day (BID) | ORAL | Status: DC
  Administered 2019-06-20 – 2019-06-21 (×2): 30 mL via ORAL
  Filled 2019-06-20 (×4): qty 30

## 2019-06-20 MED ORDER — PROMETHAZINE HCL 25 MG PO TABS
12.5000 mg | ORAL_TABLET | Freq: Four times a day (QID) | ORAL | Status: DC | PRN
  Administered 2019-06-20: 05:00:00 12.5 mg via ORAL
  Filled 2019-06-20: qty 1

## 2019-06-20 MED ORDER — MUPIROCIN 2 % EX OINT
1.0000 "application " | TOPICAL_OINTMENT | Freq: Two times a day (BID) | CUTANEOUS | Status: DC
  Administered 2019-06-20 – 2019-06-22 (×5): 1 via NASAL
  Filled 2019-06-20: qty 22
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
## 2019-06-20 NOTE — Progress Notes (Signed)
CRITICAL VALUE ALERT  Critical Value:  hgb 6.7  Date & Time Notied:  06/20/19 0645  Provider Notified: Baltazar Najjar  Orders Received/Actions taken: awaiting orders
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
## 2019-06-21 LAB — CBC WITH DIFFERENTIAL/PLATELET
Abs Immature Granulocytes: 0.09 10*3/uL — ABNORMAL HIGH (ref 0.00–0.07)
Basophils Absolute: 0 10*3/uL (ref 0.0–0.1)
Basophils Relative: 0 %
Eosinophils Absolute: 0.2 10*3/uL (ref 0.0–0.5)
Eosinophils Relative: 2 %
HCT: 27.3 % — ABNORMAL LOW (ref 39.0–52.0)
Hemoglobin: 8 g/dL — ABNORMAL LOW (ref 13.0–17.0)
Immature Granulocytes: 1 %
Lymphocytes Relative: 36 %
Lymphs Abs: 3.3 10*3/uL (ref 0.7–4.0)
MCH: 24.5 pg — ABNORMAL LOW (ref 26.0–34.0)
MCHC: 29.3 g/dL — ABNORMAL LOW (ref 30.0–36.0)
MCV: 83.7 fL (ref 80.0–100.0)
Monocytes Absolute: 1.1 10*3/uL — ABNORMAL HIGH (ref 0.1–1.0)
Monocytes Relative: 12 %
Neutro Abs: 4.4 10*3/uL (ref 1.7–7.7)
Neutrophils Relative %: 49 %
Platelets: 357 10*3/uL (ref 150–400)
RBC: 3.26 MIL/uL — ABNORMAL LOW (ref 4.22–5.81)
RDW: 16.6 % — ABNORMAL HIGH (ref 11.5–15.5)
WBC: 9.2 10*3/uL (ref 4.0–10.5)
nRBC: 0 % (ref 0.0–0.2)

## 2019-06-21 LAB — COMPREHENSIVE METABOLIC PANEL
ALT: 15 U/L (ref 0–44)
AST: 20 U/L (ref 15–41)
Albumin: 1.4 g/dL — ABNORMAL LOW (ref 3.5–5.0)
Alkaline Phosphatase: 75 U/L (ref 38–126)
Anion gap: 5 (ref 5–15)
BUN: 18 mg/dL (ref 6–20)
CO2: 20 mmol/L — ABNORMAL LOW (ref 22–32)
Calcium: 8.1 mg/dL — ABNORMAL LOW (ref 8.9–10.3)
Chloride: 114 mmol/L — ABNORMAL HIGH (ref 98–111)
Creatinine, Ser: 0.31 mg/dL — ABNORMAL LOW (ref 0.61–1.24)
GFR calc Af Amer: >60 mL/min (ref >60)
GFR calc non Af Amer: >60 mL/min (ref >60)
Glucose, Bld: 100 mg/dL — ABNORMAL HIGH (ref 70–99)
Potassium: 4.9 mmol/L (ref 3.5–5.1)
Sodium: 139 mmol/L (ref 135–145)
Total Bilirubin: 0.2 mg/dL — ABNORMAL LOW (ref 0.3–1.2)
Total Protein: 5 g/dL — ABNORMAL LOW (ref 6.5–8.1)

## 2019-06-21 LAB — TYPE AND SCREEN
ABO/RH(D): B POS
Antibody Screen: NEGATIVE
Unit division: 0

## 2019-06-21 LAB — BPAM RBC
Blood Product Expiration Date: 202103032359
ISSUE DATE / TIME: 202102040953
Unit Type and Rh: 7300

## 2019-06-21 LAB — PHOSPHORUS: Phosphorus: 2.4 mg/dL — ABNORMAL LOW (ref 2.5–4.6)

## 2019-06-21 LAB — MAGNESIUM: Magnesium: 1.3 mg/dL — ABNORMAL LOW (ref 1.7–2.4)

## 2019-06-21 MED ORDER — SODIUM CHLORIDE 0.9 % IV SOLN: INTRAVENOUS | Status: DC

## 2019-06-21 MED ORDER — SODIUM CHLORIDE 0.9 % IV BOLUS
1000.0000 mL | Freq: Once | INTRAVENOUS | Status: AC
  Administered 2019-06-21: 18:00:00 1000 mL via INTRAVENOUS

## 2019-06-21 MED ORDER — PROMETHAZINE HCL 25 MG/ML IJ SOLN
12.5000 mg | Freq: Once | INTRAMUSCULAR | Status: AC
  Administered 2019-06-21: 12.5 mg via INTRAVENOUS

## 2019-06-21 MED ORDER — MAGNESIUM SULFATE IN D5W 1-5 GM/100ML-% IV SOLN
1.0000 g | Freq: Once | INTRAVENOUS | Status: AC
  Administered 2019-06-21: 18:00:00 1 g via INTRAVENOUS
  Filled 2019-06-21: qty 100

## 2019-06-21 MED ORDER — MAGNESIUM SULFATE 2 GM/50ML IV SOLN
2.0000 g | Freq: Once | INTRAVENOUS | Status: AC
  Administered 2019-06-21: 10:00:00 2 g via INTRAVENOUS
  Filled 2019-06-21: qty 50

## 2019-06-21 MED ORDER — K PHOS MONO-SOD PHOS DI & MONO 155-852-130 MG PO TABS
500.0000 mg | ORAL_TABLET | Freq: Three times a day (TID) | ORAL | Status: DC
  Administered 2019-06-21 – 2019-06-22 (×5): 500 mg via ORAL
  Filled 2019-06-21 (×6): qty 2

## 2019-06-21 MED ORDER — MAGNESIUM OXIDE 400 (241.3 MG) MG PO TABS
800.0000 mg | ORAL_TABLET | Freq: Two times a day (BID) | ORAL | Status: DC
  Administered 2019-06-21 – 2019-06-22 (×2): 800 mg via ORAL
  Filled 2019-06-21 (×2): qty 2

## 2019-06-21 NOTE — Plan of Care (Signed)

## 2019-06-21 NOTE — Progress Notes (Signed)
RN informed Dr. Sloan Leiter that pt is nauseated. Phenergan dose was given at 1501. Pt tachycardic in 130-140. HR currently 134. MD ordered 1L normal saline bolus and 12.5mg  Phenergan dose.  RN will continue to monitor/assess pt
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
## 2019-06-21 NOTE — Progress Notes (Addendum)
RN informed Dr. Sloan Leiter, that pt had a 5-beat run of VT, while asleep. Current Vitals BP 80/55 (64) HR 121 R19. No current distress noted  MD will monitor pt and replace Mg later today.  RN will continue to monitor/assess pt

## 2019-06-21 NOTE — Progress Notes (Signed)
RN informed Dr. Sloan Leiter that pt remains hypotensive BP 83/68 (73) HR 120 R28. 1700 Midodrine dose has been given at this time. Pt is asymptomatic and not c/o pain.  RN will continue to monitor/asses pt

## 2019-06-22 DIAGNOSIS — G903 Multi-system degeneration of the autonomic nervous system: Secondary | ICD-10-CM

## 2019-06-22 LAB — BASIC METABOLIC PANEL
Anion gap: 6 (ref 5–15)
BUN: 12 mg/dL (ref 6–20)
CO2: 22 mmol/L (ref 22–32)
Calcium: 7.7 mg/dL — ABNORMAL LOW (ref 8.9–10.3)
Chloride: 110 mmol/L (ref 98–111)
Creatinine, Ser: <0.3 mg/dL — ABNORMAL LOW (ref 0.61–1.24)
Glucose, Bld: 106 mg/dL — ABNORMAL HIGH (ref 70–99)
Potassium: 4.2 mmol/L (ref 3.5–5.1)
Sodium: 138 mmol/L (ref 135–145)

## 2019-06-22 LAB — MAGNESIUM: Magnesium: 1.7 mg/dL (ref 1.7–2.4)

## 2019-06-22 LAB — CBC WITH DIFFERENTIAL/PLATELET
Abs Immature Granulocytes: 0.1 10*3/uL — ABNORMAL HIGH (ref 0.00–0.07)
Basophils Absolute: 0 10*3/uL (ref 0.0–0.1)
Basophils Relative: 0 %
Eosinophils Absolute: 0.2 10*3/uL (ref 0.0–0.5)
Eosinophils Relative: 2 %
HCT: 26.9 % — ABNORMAL LOW (ref 39.0–52.0)
Hemoglobin: 7.9 g/dL — ABNORMAL LOW (ref 13.0–17.0)
Immature Granulocytes: 1 %
Lymphocytes Relative: 25 %
Lymphs Abs: 2.7 10*3/uL (ref 0.7–4.0)
MCH: 24.7 pg — ABNORMAL LOW (ref 26.0–34.0)
MCHC: 29.4 g/dL — ABNORMAL LOW (ref 30.0–36.0)
MCV: 84.1 fL (ref 80.0–100.0)
Monocytes Absolute: 1 10*3/uL (ref 0.1–1.0)
Monocytes Relative: 9 %
Neutro Abs: 7.1 10*3/uL (ref 1.7–7.7)
Neutrophils Relative %: 63 %
Platelets: 281 10*3/uL (ref 150–400)
RBC: 3.2 MIL/uL — ABNORMAL LOW (ref 4.22–5.81)
RDW: 16.8 % — ABNORMAL HIGH (ref 11.5–15.5)
WBC: 11.2 10*3/uL — ABNORMAL HIGH (ref 4.0–10.5)
nRBC: 0 % (ref 0.0–0.2)

## 2019-06-22 LAB — PHOSPHORUS: Phosphorus: 2.9 mg/dL (ref 2.5–4.6)

## 2019-06-22 MED ORDER — MAGNESIUM OXIDE 400 MG PO TABS: 400.0000 mg | ORAL_TABLET | Freq: Two times a day (BID) | ORAL | 0 refills | Status: DC

## 2019-06-22 MED ORDER — PROMETHAZINE HCL 25 MG PO TABS: 12.5000 mg | ORAL_TABLET | Freq: Four times a day (QID) | ORAL | 0 refills | Status: DC | PRN

## 2019-06-22 MED ORDER — K PHOS MONO-SOD PHOS DI & MONO 155-852-130 MG PO TABS: 500.0000 mg | ORAL_TABLET | Freq: Three times a day (TID) | ORAL | 0 refills | Status: DC

## 2019-06-22 NOTE — Discharge Summary (Signed)
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 MRSA Assay (FDA approved for NASAL specimens only), is one component of a comprehensive MRSA colonization surveillance program. It is not intended to diagnose MRSA infection nor to guide or monitor treatment for MRSA infections. RESULT CALLED TO, READ BACK BY AND VERIFIED WITH: Cain Saupe RN 06/19/19 1718 JDW Performed at [facility], [place] [address], [place], [place] [address]   Culture, Urine     Status: None   Collection Time: 06/20/19 12:47 AM   Specimen: Urine, Random  Result Value Ref Range Status   Specimen Description URINE, RANDOM  Final   Special Requests NONE  Final   Culture   Final    NO  GROWTH Performed at [facility], [place] [address], [place], [place] [address]    Report Status 06/20/2019 FINAL  Final     Time coordinating discharge:  40 minutes  SIGNED:   Barb Merino, MD  [facility] 06/22/2019, 11:22 AM

## 2019-06-22 NOTE — Progress Notes (Signed)
PTAR arrived as nurse was reviewing MEWS change from yellow to red due to HR of 111 and RR of 25, which is not abnormal for the patient. The provider is already aware of the patient's yellow MEWS baseline as he has been slightly tachycardic and tachypneic this admission. Before being transferred onto the stretcher, the patient was calm and watching TV. Foley bag emptied and patient covered with blanket before leaving the unit.

## 2019-06-22 NOTE — Progress Notes (Addendum)
1040 Patient to be discharged to home today per provider. ON call social worker contacted for [place] re-evalu/resume, and transportation. Will come to see patient and arrange this. Simmie Davies RN   1200: Social worker notified RN that family will not be available to receive patient at home until 2030 due to a crisis. Notified charge nurse and patient. Provider aware. Simmie Davies RN   [phone]: Pt ready for transport at 0800. Lines, foley, colostomy intact, Drsgs intact, Ate all meals. Denies pain. PICC line removed by IV team. AVS summary reviewed with patient. Report will be given to next shift to finish discharge/transport. Simmie Davies RN

## 2019-06-22 NOTE — TOC Progression Note (Signed)
Transition of Care [facility]) - Progression Note    Patient Details  Name: [name] [name] MRN: [id number] Date of Birth: Jul 19, 1965  [facility]) CM/SW [place], LCSW Phone Number: 06/22/2019, 4:30 PM  Clinical Narrative:   CSW received consult for Home Health DME services. CSW spoke with patient and noted patient's report that "I've been with everyone before" and provided verbal consent. CSW spoke with MD and noted PT/OT is not indicated due to chronic status of his medical condition and an aide or RN may be helpful. CSW spoke with daughter and noted they stated they have had aides and nurses before who were not helpful and that she would care for him as she has for awhile and did not want them. CSW additionally noted family reported she was in a crisis and would be unable to be at home with the patient until 8PM     Expected Discharge Plan and Services           Expected Discharge Date: 06/22/19                                     Social Determinants of Health (SDOH) Interventions    Readmission Risk Interventions Readmission Risk Prevention Plan 05/22/2019 05/20/2019 04/29/2019  Transportation Screening - Complete Complete  PCP or Specialist Appt within 5-7 Days - - -  PCP or Specialist Appt within 3-5 Days - - -  Not Complete comments - - -  Home Care Screening - - -  Medication Review (RN CM) - - -  HRI or [place] Work Consult for [place] - - -  Medication Review Press photographer) - Complete Complete  PCP or Specialist appointment within 3-5 days of discharge Complete Not Complete Complete  PCP/Specialist Appt Not Complete comments - DC date unknown -  HRI or [place] - Complete Complete  SW Recovery Care/Counseling Consult - Complete Complete  Palliative Care Screening - Not Applicable Not Applicable  Comments - - -  [place] -  Complete Complete  Some recent data might be hidden

## 2019-06-23 LAB — CULTURE, BLOOD (ROUTINE X 2)
Culture: NO GROWTH
Culture: NO GROWTH

## 2019-06-28 ENCOUNTER — Encounter (HOSPITAL_BASED_OUTPATIENT_CLINIC_OR_DEPARTMENT_OTHER): Payer: Medicare Other | Attending: Internal Medicine | Admitting: Internal Medicine

## 2019-06-28 ENCOUNTER — Other Ambulatory Visit: Payer: Self-pay

## 2019-06-28 DIAGNOSIS — G8250 Quadriplegia, unspecified: Secondary | ICD-10-CM | POA: Insufficient documentation

## 2019-06-28 DIAGNOSIS — D649 Anemia, unspecified: Secondary | ICD-10-CM | POA: Diagnosis not present

## 2019-06-28 DIAGNOSIS — K2100 Gastro-esophageal reflux disease with esophagitis, without bleeding: Secondary | ICD-10-CM | POA: Diagnosis not present

## 2019-06-28 DIAGNOSIS — L89220 Pressure ulcer of left hip, unstageable: Secondary | ICD-10-CM | POA: Insufficient documentation

## 2019-06-28 DIAGNOSIS — Z6821 Body mass index (BMI) 21.0-21.9, adult: Secondary | ICD-10-CM | POA: Insufficient documentation

## 2019-06-28 DIAGNOSIS — Z809 Family history of malignant neoplasm, unspecified: Secondary | ICD-10-CM | POA: Diagnosis not present

## 2019-06-28 DIAGNOSIS — Z833 Family history of diabetes mellitus: Secondary | ICD-10-CM | POA: Diagnosis not present

## 2019-06-28 DIAGNOSIS — G4733 Obstructive sleep apnea (adult) (pediatric): Secondary | ICD-10-CM | POA: Insufficient documentation

## 2019-06-28 DIAGNOSIS — Z933 Colostomy status: Secondary | ICD-10-CM | POA: Diagnosis not present

## 2019-06-28 DIAGNOSIS — L89153 Pressure ulcer of sacral region, stage 3: Secondary | ICD-10-CM | POA: Insufficient documentation

## 2019-06-28 DIAGNOSIS — E876 Hypokalemia: Secondary | ICD-10-CM | POA: Diagnosis not present

## 2019-06-28 DIAGNOSIS — E43 Unspecified severe protein-calorie malnutrition: Secondary | ICD-10-CM | POA: Insufficient documentation

## 2019-06-28 DIAGNOSIS — L89314 Pressure ulcer of right buttock, stage 4: Secondary | ICD-10-CM | POA: Diagnosis not present

## 2019-06-28 DIAGNOSIS — I10 Essential (primary) hypertension: Secondary | ICD-10-CM | POA: Insufficient documentation

## 2019-06-28 DIAGNOSIS — J9610 Chronic respiratory failure, unspecified whether with hypoxia or hypercapnia: Secondary | ICD-10-CM | POA: Insufficient documentation

## 2019-06-28 DIAGNOSIS — M8660 Other chronic osteomyelitis, unspecified site: Secondary | ICD-10-CM | POA: Insufficient documentation

## 2019-06-28 DIAGNOSIS — L89894 Pressure ulcer of other site, stage 4: Secondary | ICD-10-CM | POA: Diagnosis not present

## 2019-06-28 DIAGNOSIS — E869 Volume depletion, unspecified: Secondary | ICD-10-CM | POA: Diagnosis not present

## 2019-06-28 DIAGNOSIS — Z9049 Acquired absence of other specified parts of digestive tract: Secondary | ICD-10-CM | POA: Diagnosis not present

## 2019-06-28 DIAGNOSIS — G40909 Epilepsy, unspecified, not intractable, without status epilepticus: Secondary | ICD-10-CM | POA: Insufficient documentation

## 2019-06-28 DIAGNOSIS — Z981 Arthrodesis status: Secondary | ICD-10-CM | POA: Insufficient documentation

## 2019-06-28 DIAGNOSIS — I252 Old myocardial infarction: Secondary | ICD-10-CM | POA: Insufficient documentation

## 2019-06-28 NOTE — Progress Notes (Signed)
[name] [name], [name] [name] ([id number]) Visit Report for 06/28/2019 Abuse/Suicide Risk Screen Details Patient Name: Date of Service: [name] [name], [name] [name] 06/28/2019 2:45 PM Medical Record [id number] Patient Account Number: [id number] Date of Birth/Sex: Treating RN: 1966-04-06 (53 y.o. [name] [name]) Carlene Coria Primary Care Rechy Bost: Park Meo Other Clinician: Referring Tiaunna Buford: Treating Bedelia Pong/Extender:Robson, Edythe Clarity, Campbell Riches in Treatment: 0 Abuse/Suicide Risk Screen Items Answer ABUSE RISK SCREEN: Has anyone close to you tried to hurt or harm you recentlyo No Do you feel uncomfortable with anyone in your familyo No Has anyone forced you do things that you didnt want to doo No Electronic Signature(s) Signed: 06/28/2019 5:18:45 PM By: Carlene Coria RN Entered By: Carlene Coria on 06/28/2019 14:52:35 -------------------------------------------------------------------------------- Activities of Daily Living Details Patient Name: Date of Service: [name] [name], [name] [name] 06/28/2019 2:45 PM Medical Record [place] Patient Account Number: [id number] Date of Birth/Sex: Treating RN: 1965-08-05 (53 y.o. [name] [name]) Carlene Coria Primary Care Adel Neyer: Park Meo Other Clinician: Referring Zaydenn Balaguer: Treating Berneice Zettlemoyer/Extender:Robson, Edythe Clarity, Campbell Riches in Treatment: 0 Activities of Daily Living Items Answer Activities of Daily Living (Please select one for each item) Drive Automobile Not Able Take Medications Need Assistance Use Telephone Completely [place] for Appearance Need Assistance Use Toilet Need Assistance Bath / Shower Not Able Dress Self Not Able Feed Self Need Assistance Walk Not Able Get In / Out Bed Need Assistance Housework Not Able Prepare Meals Not Able Handle Money Completely Able Shop for Self Not Able Electronic Signature(s) Signed: 06/28/2019 5:18:45 PM By: Carlene Coria RN Entered By: Carlene Coria on 06/28/2019  14:53:19 -------------------------------------------------------------------------------- Education Screening Details Patient Name: Date of Service: [name] [name], [name] T. 06/28/2019 2:45 PM Medical Record [id number] Patient Account Number: [id number] Date of Birth/Sex: Treating RN: 03-01-1966 (53 y.o. [name] [name]) Carlene Coria Primary Care Cord Wilczynski: Park Meo Other Clinician: Referring Damarcus Reggio: Treating Freman Lapage/Extender:Robson, Edythe Clarity, Campbell Riches in Treatment: 0 Primary Learner Assessed: Patient Learning Preferences/Education Level/Primary Language Learning Preference: Explanation, Demonstration, Printed Material Highest Education Level: High School Preferred Language: English Cognitive Barrier Language Barrier: No Translator Needed: No Memory Deficit: No Emotional Barrier: No Cultural/Religious Beliefs Affecting Medical Care: No Physical Barrier Impaired Vision: Yes Glasses Impaired Hearing: No Decreased Hand dexterity: No Knowledge/Comprehension Knowledge Level: High Comprehension Level: High Ability to understand written High instructions: Ability to understand verbal High instructions: Motivation Anxiety Level: Calm Cooperation: Cooperative Education Importance: Acknowledges Need Interest in Health Problems: Asks Questions Perception: Coherent Willingness to Engage in Self- High Management Activities: Readiness to Engage in Self- High Management Activities: Electronic Signature(s) Signed: 06/28/2019 5:18:45 PM By: Carlene Coria RN Entered By: Carlene Coria on 06/28/2019 14:53:56 -------------------------------------------------------------------------------- Fall Risk Assessment Details Patient Name: Date of Service: [name] [name], [name] T. 06/28/2019 2:45 PM Medical Record [id number] Patient Account Number: [id number] Date of Birth/Sex: Treating RN: 15-Sep-1965 (53 y.o. [name] [name]) Carlene Coria Primary Care Yaman Grauberger: Park Meo Other  Clinician: Referring Millee Denise: Treating Hulbert Branscome/Extender:Robson, Edythe Clarity, Campbell Riches in Treatment: 0 Fall Risk Assessment Items Have you had 2 or more falls in the last 12 monthso 0 No Have you had any fall that resulted in injury in the last 12 monthso 0 No FALLS RISK SCREEN History of falling - immediate or within 3 months 0 No Secondary diagnosis (Do you have 2 or more medical diagnoseso) 0 No Ambulatory aid None/bed rest/wheelchair/nurse 0 Yes Crutches/cane/walker 0 No Furniture 0 No Intravenous therapy Access/Saline/Heparin Lock 0 No Weak (short steps with or without shuffle, stooped but able to lift head 0 No while walking, may seek support from furniture) Impaired (short steps with  shuffle, may have difficulty arising from chair, 0 No head down, impaired balance) Mental Status Oriented to own ability 0 Yes Overestimates or forgets limitations 0 No Risk Level: Low Risk Score: 0 Electronic Signature(s) Signed: 06/28/2019 5:18:45 PM By: Carlene Coria RN Entered By: Carlene Coria on 06/28/2019 14:54:11 -------------------------------------------------------------------------------- Foot Assessment Details Patient Name: Date of Service: [name] Gloss. 06/28/2019 2:45 PM Medical Record [id number] Patient Account Number: [id number] Date of Birth/Sex: Treating RN: November 21, 1965 (53 y.o. [name] [name]) Carlene Coria Primary Care Kenna Seward: Park Meo Other Clinician: Referring Quinlyn Tep: Treating Justyn Langham/Extender:Robson, Edythe Clarity, Campbell Riches in Treatment: 0 Foot Assessment Items Site Locations + = Sensation present, - = Sensation absent, C = Callus, U = Ulcer R = Redness, W = Warmth, M = Maceration, PU = Pre-ulcerative lesion F = Fissure, S = Swelling, D = Dryness Assessment Right: Left: Other Deformity: No No Prior Foot Ulcer: Yes Yes Prior Amputation: No No Charcot Joint: No No Ambulatory Status: Non-ambulatory Assistance Device:  Wheelchair Gait: Electronic Signature(s) Signed: 06/28/2019 5:18:45 PM By: Carlene Coria RN Entered By: Carlene Coria on 06/28/2019 14:56:30 -------------------------------------------------------------------------------- Nutrition Risk Screening Details Patient Name: Date of Service: [name] [name], [name] [name] 06/28/2019 2:45 PM Medical Record [id number] Patient Account Number: [id number] Date of Birth/Sex: Treating RN: [birth year] (53 y.o. [name] [name]) Carlene Coria Primary Care Cambrea Kirt: Park Meo Other Clinician: Referring Hurshell Dino: Treating Mialani Reicks/Extender:Robson, Edythe Clarity, Suella [place] Weeks in Treatment: 0 Height (in): 72 Weight (lbs): 157 Body Mass Index (BMI): 21.3 Nutrition Risk Screening Items Score Screening NUTRITION RISK SCREEN: I have an illness or condition that made me change the kind and/or 2 Yes amount of food I eat I eat fewer than two meals per day 0 No I eat few fruits and vegetables, or milk products 0 No I have three or more drinks of beer, liquor or wine almost every day 0 No I have tooth or mouth problems that make it hard for me to eat 0 No I don't always have enough money to buy the food I need 0 No I eat alone most of the time 0 No I take three or more different prescribed or over-the-counter drugs a day 1 Yes 2 Yes Without wanting to, I have lost or gained 10 pounds in the last six months I am not always physically able to shop, cook and/or feed myself 0 No Nutrition Protocols Good Risk Protocol Provide education on Moderate Risk Protocol 0 nutrition High Risk Proctocol Risk Level: Moderate Risk Score: 5 Electronic Signature(s) Signed: 06/28/2019 5:18:45 PM By: Carlene Coria RN Entered By: Carlene Coria on 06/28/2019 14:54:42

## 2019-07-01 NOTE — Progress Notes (Signed)
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 follow-up possibilities with Dr. [name]. He has [facility] home care coming out for wound care 10/15/15 no major changes patient states he feels well antibiotics were  completed and PICC line removed he has [facility] home [facility] 10/29/15; the patient expresses concern about the antibiotics all being stopped. He also tells [place] that [facility] home care has stopped coming to his house and his left him with a week's worth of supplies 11/12/15; The patient has a new wound over the right hip area which he says was a result of trauma when he was in the ER last week (taking off his pants). He did receive wound care supplies we ordered 2 weeks ago. He states he is stable READMISSION 02/12/16; the patient was hospitalized from 7/6 through 7/14. Presenting with worsening wounds and sepsis. He was discovered to have on CT scan of the abdomen and pelvis bilateral lower lobe pneumonia and he was actually treated for bilateral lower lobe pneumonia. He was noted also to have chronic osteomyelitis. This was discussed with infectious disease and he was not felt to require treatment for this. Saw [name] of plastic surgery who recommended hydrotherapy which was done in the hospital. Turning no surgery. The patient then spent 2 months at [facility]. Patient states that they healed out a wound on his right hip I can't really verify that. He came home with no orders for the area on his right posterior rib cage and he asked that we take care of that 03/21/16 at this point in time patient continues to experience ongoing pressure ulcers of the sacral area especially in the right ischial region where this continues to probe to bone. The remainder of the gluteal ulcers actually appeared to be fairly free of significant necrotic tissue at this point in time which is good news. He does have some necrotic tissue over the right flank region at this point in the superior/outer portion 04/11/16; substantial right greater than left buttock wounds bilaterally which continue to probe to bone. No debridement was required in this area. He is using silver alginate as a palliative wound  dressing and so far at least this seems to be stable. ooThe area on his right posterior rib cage is basically a figure H-shaped wound at this point the superior lobe covered in a tightly adherent nonviable tissue which was debrided with a curette. We are going to need to change back to [facility] and this wound area. 05/02/16; substantial right greater than left buttock wounds which probed to bone in the right superior pelvis. He also has wounds across his lower perineum just below his scrotum. In using silver alginate here on a palliative basis. He has underlying chronic osteomyelitis. The area that took some attention today is his right lateral rib  cage. This wound does not look as good as I remember last time. A large amount of necrotic material superiorly. This is deteriorated. 06/10/16;; the patient is brought back to clinic today after an admission to hospital from 05/07/16 through 05/22/16. He was admitted with a 3 week history of low-grade fevers increasing drainage from his ulcers noted by the home health nurse. He was admitted for sepsis secondary to recurrent osteomyelitis. 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 Blood cultures grew coagulase-negative staph. Infectious disease recommended 6 weeks of IV vancomycin and Zosyn. Plastic surgery saw the patient and performed an extensive debridement of his right lateral posterior wound across his ribs. This is the wound that had deteriorated last time he was here. Apparently a bone culture of this area grew gram-negative rods [Escherichia coli] however the discrepancy between blood culture and bone culture was noted by infectious disease and is antibiotics were not changed. At the time of this dictation I have not reviewed the bone culture results on the right rib cage. [name] put ACEL and a VAC on the  posterior right rib cage wound which is somewhat confusing given the suggestion of underlying osteomyelitis here. The patient has a suprapubic catheter and has recurrent UTIs as well. His chest x-ray showed an expansile lytic bone lesion with distruction involving the right posterior fifth rib presumably this is due to infection. Finally he is listed as having severe protein calorie malnutrition however at the moment I [facility] not [facility] he has a PICC line in he tells me he is getting antibiotics at home. According to what I understand he is getting wet to dry dressings to all wound areas. 2 can still see the A cells sutured into the right posterior rib area I did not disturb this. Cultures of the right ribs showed a few Escherichia coli and a few Pseudomonas, Escherichia coli should've been sensitive to Zosyn, I do not have the Pseudomonas culture at the moment 07/14/16; the patient was hospitalized from 2/2 through 2/5 with vancomycin toxicity resulting in acute kidney injury. This apparently stabilized. He is now off antibiotics but he says he had 4 or 5 weeks. 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 He was reviewed by infectious disease who thought that he completed his course of antibiotics. He also had osteomyelitis on the right lateral rib area. In any case he is not currently on any antibiotics He comes with a vague history of that the wound VAC has been discontinued from the right lateral rib wounds which was the area that had the osteomyelitis. It looks like he had some drape injury around this but in general the wound here looks fairly stable. I'm uncertain at the time of this dictation whether somebody expected me to reorder parts of the wound VAC or the actual machine or supplies. This was being handled by Dr. Merri Ray 07/29/16; he is off antibiotics PICC line is out. He has chronic sacral osteomyelitis. He also had osteomyelitis on his right lateral rib area  posteriorly and it is this area that really does not look very good today. Dark necrotic material with dark discoloration of the surrounding skin. He is insensate in this area. The area on his left leg has healed right leg is still open and of course the large area over his bilateral buttock's is a chronic draining stage IV wound 08/12/16- patient  is here for follow-up evaluation of multiple pressure ulcers. He was recently hospitalized from 3/18- 3/25 to [facility] for wound infection. During the course of this hospitalization:negative blood culture o2, no wound culture obtained, surgical intervention (debridement and application of Acell and vac per Dr. [name]). He was discharged on ceftriaxone and metronidazole with an anticipated stop date at 41/5. He is voicing no complaints, denies fever or chills since discharge. According to the patient he has no follow-up appointment scheduled with plastics as there is no hoyer lift in her office. 08/26/16; he continues to have a substantial wound over his bilateral buttocks, right posterior rib cage, new wounds on the right lower flank which I think are pressure ulcers from perhaps pants that are onto tight and he has a new wound on the right lateral knee. We are using silver alginate to most of this. Patient voices a preference for Dakin's wet-to-dry to the posterior right rib cage 09/09/16; patient has been in hospital since last time he was here apparently with an upper GI bleed. This was managed conservatively and he stabilized with PPIs. If anything his substantial wound area over the bilateral buttocks is worse, there is hardly any normal tissue here. He has a large wound on the right lower rib cage which had underlying osteomyelitis. Wounds on the bilateral lower legs including a reopening on the right leg 09/30/16; patient states he is generally felt well no fever no chills no further upper GI bleeding. Relevant issues for this visit and  include ooThe area over his right posterior ribs has an odor with necrotic material ooNew wound over his plantar right foot over the metatarsal heads 10/17/16; patient reports no systemic symptoms. However the area over the right posterior ribs again has copious necrotic material in the upper 50% of the wound area. ooAreas over the left medial right lateral and right posterior plantar foot have all closed 10/24/16; culture I did last week of this area was surprisingly negative. 50% of the wound over the right posterior ribs still covered and necrotic material. Again aggressive debridement required here. I gave him 10 days' worth of Cefdinir which should end in 3 days. He had underlying osteomyelitis under this area at one point 10/31/16. The patient arrived today with a low-grade temperature. States he doesn't really feel well. There was an odor not exactly sure if that is coming from his ribs or the extensive areas over his buttocks. He tells me he is not having diarrhea. He had a headache this morning 01/02/17; the patient arrives back in clinic today after a stay at [facility]. He had been hospitalized for an infection I believe in the right posterior rib area. He completed his antibiotics and had a wound VAC on this area while he was in the nursing home. He was discharged to home however [facility] or home health company that had them before apparently will not pick him up. [facility] came up to the house they told him they would not pick him up. His sister is using various odd combination of supplies that they have at home. Apparently the [facility] home health nurse also called [employer] I'm not sure what the issue here is. Mr. Lafavor is cognitively intact and able to make his own decisions 01/23/17; after he was here last time there was some talk about him going back to a nursing home, apparently adult protective services was involved however in spite of this he remains at home under the care  of his sister and her family. They do not have home health for reasons that are not totally clear. They've been using silver alginate and Hydrofera Blue that they had left over from previous wounds 02/13/17; since he was last here the patient has been denied for home health and there apparently was no nursing home willing to accept him or at least no nursing home that was willing to except him that he was willing to go to. He has also had problems with his hospital bed no longer working so he was sitting in this at 58. 02/27/17; since he was last here the patient is still at home with his sister. He managed to get a new bed but did not get a proper level surface. He states he feels generally well. 03/13/17; since the last time the patient was here he was hospitalized from 10/16 through 03/02/17. He arrived with chills and a temperature of 100 nausea. I think he was ultimately felt to have a UTI nothing specific was cultured from his urine however. His wounds were seen by [name] ham of recommended local wound care. Notable for the fact that they labeled him as having severe chronic malnutrition although I don't see the verification of that. The patient states he eats well. 04/17/17; patient arrives for monthly visit. He is still living at home with his sister. He has the following wounds; ooRight posterior lateral rib cage. The surface of this does not look as good as I saw this month ago. Necrotic debris. He has had osteomyelitis in this area in the past all change the primary dressing here to Iodoflex ooMultitude of wounds over the buttocks with a probing wound into the right hip area but no palpable bone none of this is particularly different. ooHe has a new pressure area over the left lateral greater trochanter. This is stage II superficial ooRight anterior foot area had exposed tendon last time that is closed. The patient says that he is been eating and drinking well. He may have  had a temperature of about 100 over the weekend 05/01/17; patient arrives for his monthly visit. His major wounds include; ooRight posterior lateral rib cage. He has 2 open areas here almost like a figure 8. The bottom of this needed a vigorous debridement the top part doesn't look so bad ooThe wounds over the buttocks and a probing wound in the right hip area actually looks fairly good for him he seems to be doing well ooThe area over the left greater trochanter from last time has healed ooRight anterior foot/ankle is a clean superficial wound at this point it is continued to improve ooHe has 2 open areas on the distal part of his third and fourth fingers on the right hand. He states these started as blisters I am not exactly sure how this could've happened. 05/15/17; I brought the patient back in 2 weeks to mostly look at the right posterior lateral rib cage. This needed a vigorous debridement 2 weeks ago and I change the primary dressing her to Iodoflex. His other wounds are stable using silver alginate including the large area of his bilateral buttocks and the right anterior/ankle foot. Patient states he was in the hospital last week for 4 days secondary to a UTI [has a suprapubic catheter]. I'll try to review these records. He states he didn't have any relevant imaging studies 06/20/16; since the patient was last here he was admitted to hospital from 06/01/17 through 06/05/17. He was felt to have  sepsis secondary to a complicated UTI treated with antibiotics. He has a suprapubic catheter. He was discharged on cefepime but has since completed that. He continues to have a complicated wound on the right posterior rib cage, improving wounds over a large area of his buttock, probing area into the right ischial tuberosity with a history of underlying osteomyelitis. He has an area over the left anterior ankle and what looks to be of blister on the right fifth metatarsal head plantar  aspect 07/10/16; patient has not been here in 3 weeks. He was in the ER with diarrhea. He was diagnosed with pseudomembranous colitis and is followed up with [name] of infectious disease on Flagyl. We have been using Hydrofera Blue to the area on the right posterior ribs silver alginate to everything else. He has a new wound on the right heel tip and several areas on the second third and fourth fingers. The exact etiology of the finger wounds isn't really clear. 08/07/17; we have been using Hydrofera Blue to the area on the right posterior rib cage and silver alginate to the other wounds including but I ask, right dorsal foot and several areas of undetermined etiology on his dorsal fingers of the right hand. Since he was last here he was hospitalized from 07/18/17 through 07/22/17. Initially presenting with high fever 103 felt to be septic. Initially this was felt to be secondary to pneumonia possibly a UTI in the setting of a suprapubic catheter. The chest that showed basal a total ectasis with a 8.9 x 4.9 cm right fifth rib lesion. Bone scan was completed showing that the fifth 8 and ninth ribs were not consistent with specific pathology. Possible chronic osteomyelitis. The patient was seen by oncology who was initially concerned about either metastatic or primary bone malignancy. However they seem reassured by the bone scan. He is completed antibiotics. He is not feeling well with 3 full ostomy bags of liquid diarrhea per 24 hours. He states that this started right after he left the hospital. Previously seen last month by [name] and was treated with Flagyl for C. difficile. The patient states he got better and then when he left the hospital 2 days later he is now back to having 3 ostomy bags full of liquid stool. He generally doesn't feel well. He has not been vomiting. He states he is eating and drinking 09/18/17; Patient in hospital in late April for delirium presumed to be a UTI. Culture  grew multiple organisms. Nevertheless less he was treated and discharged on Keflex. No other changes 10/17/17; recent hospitalization for pneumonia since we last saw him. Silver alginate use to the wounds which by and large looks somewhat better. He has a new area on the posterior right heel. He is still at home, sister still doing the dressings 11/09/17; the patient was in the ER last week with non-he is still complaining of nausea and vomiting. He was not admitted lab work showed a white count of 9.3 and hemoglobin of 8.8 differential count was normal. Electrolytes were normal. Urinalysis was positive for large leukocytes see if they did a culture. He was given 1 g of Rocephin 12/01/17; was last here he was hospitalized from 11/17/17 through 11/27/17. Actually presented with increasing abdominal pain nausea and vomiting. He underwent a complete evaluation by GI. His colonoscopy was unremarkable. He was transfused 3 units of blood. Endoscopy showed nonerosive esophagitis. He is on a PPI but still complaining of unrelenting nausea He also saw general surgery  in ID about the right sided posterior rib wounds/UTI. He was given antibiotics but finished them on 7/11 i.e. before hospitalization was ended. I suspect he was not felt to have an active infection in the bone in any of the wound areas. He is been put on Santyl in the right ribs wounds then I had a mono Iodoflex but I think the Santyl is fine. He is using wet to dry on the sacral wounds including the deep probing area into the right hip I think silver alginate some better choice here because of the drainage. 01/16/18; since the patient was last here he was actually hospitalized in [facility] and underwent an open cholecystectomy for the unrelenting nausea. Since then he feels better. He also tells me that his right lateral rib wound was debrided in the OR as well as the new area in the left greater trochanter. I don't have any information on this.  Since he has returned home he is been using Santyl to the right lateral rib wound as well as the area on the left trochanter. Silver alginate to the areas on his buttocks and sacrum.Steward Drone on an light Coban 01/29/18; patient back in predominantly from review of the wounds in the right lateral lives and the new area from last time on the left greater trochanter. Her using Santyl to both of these wounds. Silver alginate to the areas on his buttocks and sacrum. The area on the right lateral ribs actually looks somewhat stable. The area on the left greater trochanter has no viable tissue over the wound surface 03/20/2018; patient has not been here in almost 6 weeks. He was hospitalized at [facility] from 9/22 through 10/8. Found to have osteomyelitis in the left hip also the right hemipelvis.. The area on the left greater trochanter had deteriorated.Marland Kitchen He was admitted to hospital with fever felt I think to have mostly an infection in the left greater trochanter area. CT scan from the hospitalization is listed below CONTRAST: 70 cc Omnipaque 350 FINDINGS: Bones/Joint/Cartilage In addition to the bony destructive findings of the pelvis (see dedicated CT pelvis report), there is a large decubitus ulcer of the right buttock/upper thigh region extending into the right hip joint, with associated bony destruction in chronic osteomyelitis of the head and proximal metaphysis and diaphysis of the right proximal femur, and the adjacent acetabulum. There is an unusual pattern of flowing ossification along the margins of the large decubitus ulcer, likely from chronic osteomyelitis and remodeling. There is a large amount of gas in what remains of the hip joint and the pseudoarticulation between the proximal femur in the bony pelvis. 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 The patient was  difficult to position in the distal femur is out partially out of the range that can be accurately scanned. There is evidence of degenerative arthropathy of both knees. 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 Deformity along the right proximal tibia likely from an old healed fracture. Ligaments Suboptimally assessed by CT. Muscles and Tendons Severe muscular atrophy. Poor definition of the right gluteal musculature partially from atrophy and partially from inflammation. Prominent heterotopic ossification along the right anterior adductor musculature, measuring about 17.6 cm in length and with chronic fragmentation centrally. Soft tissues Large sacral decubitus ulcer extending down to the sacral periosteum. Likely reactive right inguinal lymph node, 1.5  cm in short axis on image 42/2. Subcutaneous edema along the margins of the patient's bilateral posterior decubitus ulcers. IMPRESSION: 1. 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 Chronic osteomyelitis and destruction of the adjacent acetabulum. Large decubitus ulcer extends into the joint and pseudoarticulation. 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 2. 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 Adjacent reactive inguinal lymph nodes bilaterally. 3. There is also left-sided decubitus ulcer posterior to the hip joint with severe destructive findings in the left acetabulum and left femoral head, and resulting marked articular irregularity. 4. A decubitus ulcer also overlies the sacrum, with chronic sacral destruction. 5. Degenerative findings in both knees, partially obscured by field of view issues. They are using Santyl wet-to-dry in the left hip.  Silver alginate to the rest of his wounds as done before. He still has the PICC line in place he is on daptomycin, Avycaz and Flagyl as I understand things. These will end on November 11 04/02/2018; his antibiotics are complete. He sees infectious disease and follow-up tomorrow.. We have been using silver alginate to all wound areas except for the left greater trochanter wound we have been using Santyl 04/23/2018; the patient has been hospitalized most recently overnight on 04/12/2018 through 04/13/2018 he had an elevated white count with leukocytosis. I think was felt to have an outpatient pneumonia in the right lower lobe. He was given ceftriaxone and azithromycin. Antibiotics were changed to cefdinir and azithromycin on discharge His wounds are generally stable to improved. We have been using Santyl to the area on the left greater trochanter and silver alginate to the wounds 05/29/2018 the patient has not been here in over a month. However he was admitted to the hospital twice, the first time with pancreatitis and the second time with possible wound infection. The culprit is the left greater trochanter. He is discharged still using silver out to all his wounds but Aquacel EG to the left greater trochanter. 2/13; the patient again was admitted to the hospital in [facility] apparently spiking a fever with evidence of infection. Per the patient he was discovered to have ongoing infection in the large wound on his left greater trochanter. He was given IV antibiotics and 10 days worth of cefdinir which she is completed. He is using silver alginate to his wounds I am unable to access any information on this admission on care everywhere. The patient has the following wounds that we are following ooRight posterior ribs ooLeft greater trochanter ooRight initial tuberosity oo2 or 3 small perineal wounds ooHe does not have any evidence of a wound on his legs 3/13; once again in the months  since the patient was last year he was hospitalized from 05/18/18 through 05/21/18. he presented with subjective fever and drainage from his wounds. He was treated with empiric vancomycin and Zosyn. During this admission the patient states that they debrided the wound on the left greater trochanter although later after the patient left the clinic I am unable to see this in the discharge summary from [facility]. The discharge instructions were to apply Xeroform gauze to the wounds on the sacrum initially on with ABVD pads covering. Left hip was Aquacel Ag along with the right upper back. Her was a comment about the right medial heel we did not address this in the clinic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 Gastrointestinal Medical History: Negative for: Cirrhosis ; Colitis; Crohns; Hepatitis A; Hepatitis B; Hepatitis C Past Medical History  Notes: GASTRITIS, GASTRIC ULCER, SMALL BOWEL OBSTRUCTION, GERD Genitourinary Medical History: Negative for: End Stage Renal Disease Past Medical History Notes: MX UTI'S, suprapubic cath Immunological Medical History: Negative for: Lupus Erythematosus; Raynauds; Scleroderma Oncologic Medical History: Negative for: Received Chemotherapy; Received Radiation Immunizations Pneumococcal Vaccine: Received Pneumococcal Vaccination: Yes Implantable Devices None Hospitalization / Surgery History Type of Hospitalization/Surgery POSTERIOR CERVICAL FUSION/FORAMINOTOMY COLOSTOMY SUPRAPUBIC CATH PLACEMENT INCISION AND DRAINAGE OF WOUND ESOPHAGOGASTRODDUODENOSCOPY ACELL APPLICATION RIGHT GROIN DEBRIDE/CLOSE UTI PNA Wound infection 01/2019 - Bowel obstruction sepsis 06/2018 Family and Social History Cancer: Yes - Mother,Maternal Grandparents; Diabetes: Yes - Mother; Heart Disease: No; Hereditary Spherocytosis: No; Hypertension: No; Kidney Disease: No; Lung Disease: No; Seizures: No; Stroke: No; Thyroid Problems: No; Tuberculosis: No; Never smoker; Marital Status - Single; Alcohol Use: Never; Drug Use: No History; Caffeine Use: Rarely - soda; Financial Concerns: No; Food, Clothing or Shelter Needs: No; Support System Lacking: No; Transportation Concerns: No Electronic Signature(s) Signed: 06/28/2019 5:18:45 PM By: Carlene Coria RN Signed: 06/28/2019 5:45:04 PM By: Linton Ham MD Entered By: Carlene Coria on 06/28/2019 14:52:17 -------------------------------------------------------------------------------- SuperBill Details Patient Name: Date of Service: Elias, Deondre T. 06/28/2019 Medical Record [id number] Patient Account Number: [id number] Date of Birth/Sex: Treating RN: 1966-02-10 (54 y.o. 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, stage 4 L89.894 Pressure ulcer of other site, stage 4 M47.892 Other spondylosis, cervical region Facility Procedures CPT4 Code: [id number] Description: [phone] - WOUND CARE VISIT-LEV 5 EST PT Modifier: Quantity: 1 Physician Procedures CPT4 Code: [id number] Description: 86484 - WC PHYS LEVEL 4 - EST PT ICD-10 Diagnosis Description L89.104 Pressure ulcer of unspecified part of back, stage L89.220 Pressure ulcer of left hip, unstageable L89.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

## 2019-07-01 NOTE — Progress Notes (Signed)
KAITO, DIMLER ([id number]) Visit Report for 06/28/2019 Allergy List Details Patient Name: Date of Service: WILKIE, PACKWOOD 06/28/2019 2:45 PM Medical Record [id number] Patient Account Number: [id number] Date of Birth/Sex: Treating RN: 1966/03/22 (53 y.o. Jerilynn Mages) Carlene Coria Primary Care Niklas Chretien: Park Meo Other Clinician: Referring Sherene Plancarte: Treating Shameka Aggarwal/Extender:Robson, Edythe Clarity, Suella Grove Weeks in Treatment: 0 Allergies Active Allergies Ditropan Reaction: Hallucinations Severity: Moderate Allergy Notes Electronic Signature(s) Signed: 06/28/2019 5:18:45 PM By: Carlene Coria RN Entered By: Carlene Coria on 06/28/2019 14:48:18 -------------------------------------------------------------------------------- [place] Details Patient Name: Date of Service: [name] 06/28/2019 2:45 PM Medical Record [id number] Patient Account Number: [id number] Date of Birth/Sex: Treating RN: [birth year] (53 y.o. Jerilynn Mages) Carlene Coria Primary Care Jilberto Vanderwall: Park Meo Other Clinician: Referring Angellina Ferdinand: Treating Drexler Maland/Extender:Robson, Edythe Clarity, Campbell Riches in Treatment: 0 Visit Information Patient Arrived: Wheel Chair Arrival Time: 14:29 Accompanied By: self Transfer Assistance: Stormy Fabian History Since Last Visit Added or deleted any medications: Yes Any new allergies or adverse reactions: No Had a fall or experienced change in activities of daily living that may affect risk of falls: No Signs or symptoms of abuse/neglect since last visito No Hospitalized since last visit: Yes Implantable device outside of the clinic excluding cellular tissue based products placed in the center since last visit: No Has Dressing in Place as Prescribed: Yes Pain Present Now: No Electronic Signature(s) Signed: 06/28/2019 5:18:45 PM By: Carlene Coria RN Entered By: Carlene Coria on 06/28/2019  14:47:14 -------------------------------------------------------------------------------- Clinic Level of Care Assessment Details Patient Name: Date of Service: AGAPITO, WEINHARDT 06/28/2019 2:45 PM Medical Record Number:[id number] Patient Account Number: [id number] Date of Birth/Sex: Treating RN: October 22, 1965 (54 y.o. Marvis Repress Primary Care Chandler Swiderski: Park Meo Other Clinician: Referring Zymere Patlan: Treating Christyann Manolis/Extender:Robson, Edythe Clarity, Campbell Riches in Treatment: 0 Clinic Level of Care Assessment Items TOOL 2 Quantity Score X - Use when only an EandM is performed on the INITIAL visit 1 0 ASSESSMENTS - Nursing Assessment / Reassessment X - General Physical Exam (combine w/ comprehensive assessment (listed just below) 1 20 when performed on new pt. evals) X - Comprehensive Assessment (HX, ROS, Risk Assessments, Wounds Hx, etc.) 1 25 ASSESSMENTS - Wound and Skin Assessment / Reassessment []  - Simple Wound Assessment / Reassessment - one wound 0 X - Complex Wound Assessment / Reassessment - multiple wounds 12 5 []  - Dermatologic / Skin Assessment (not related to wound area) 0 ASSESSMENTS - Ostomy and/or Continence Assessment and Care []  - Incontinence Assessment and Management 0 []  - Ostomy Care Assessment and Management (repouching, etc.) 0 PROCESS - Coordination of Care []  - Simple Patient / Family Education for ongoing care 0 X - Complex (extensive) Patient / Family Education for ongoing care 1 20 X - Staff obtains Programmer, systems, Records, Test Results / Process Orders 1 10 []  - Staff telephones HHA, Nursing Homes / Clarify orders / etc 0 []  - Routine Transfer to another Facility (non-emergent condition) 0 []  - Routine Hospital Admission (non-emergent condition) 0 X - New Admissions / Biomedical engineer / Ordering NPWT, Apligraf, etc. 1 15 []  - Emergency Hospital Admission (emergent condition) 0 []  - Simple Discharge Coordination 0 X - Complex (extensive)  Discharge Coordination 1 15 PROCESS - Special Needs []  - Pediatric / Minor Patient Management 0 []  - Isolation Patient Management 0 []  - Hearing / Language / Visual special needs 0 []  - Assessment of Community assistance (transportation, D/C planning, etc.) 0 []  - Additional assistance / Altered mentation 0 []  - Support Surface(s) Assessment (bed, cushion, seat,  etc.) 0 INTERVENTIONS - Wound Cleansing / Measurement X - Wound Imaging (photographs - any number of wounds) 1 5 []  - Wound Tracing (instead of photographs) 0 []  - Simple Wound Measurement - one wound 0 X - Complex Wound Measurement - multiple wounds 12 5 []  - Simple Wound Cleansing - one wound 0 X - Complex Wound Cleansing - multiple wounds 12 5 INTERVENTIONS - Wound Dressings []  - Small Wound Dressing one or multiple wounds 0 []  - Medium Wound Dressing one or multiple wounds 0 X - Large Wound Dressing one or multiple wounds 12 20 []  - Application of Medications - injection 0 INTERVENTIONS - Miscellaneous []  - External ear exam 0 []  - Specimen Collection (cultures, biopsies, blood, body fluids, etc.) 0 []  - Specimen(s) / Culture(s) sent or taken to Lab for analysis 0 []  - Patient Transfer (multiple staff / Civil Service fast streamer / Similar devices) 0 []  - Simple Staple / Suture removal (25 or less) 0 []  - Complex Staple / Suture removal (26 or more) 0 []  - Hypo / Hyperglycemic Management (close monitor of Blood Glucose) 0 []  - Ankle / Brachial Index (ABI) - do not check if billed separately 0 Has the patient been seen at the hospital within the last three years: Yes Total Score: 530 Level Of Care: New/Established - Level 5 Electronic Signature(s) Signed: 07/01/2019 6:12:41 PM By: Kela Millin Entered By: Kela Millin on 06/28/2019 16:16:11 -------------------------------------------------------------------------------- Encounter Discharge Information Details Patient Name: Date of Service: [name] T. 06/28/2019 2:45  PM Medical Record [id number] Patient Account Number: [id number] Date of Birth/Sex: Treating RN: 03-10-1966 (54 y.o. [name] Primary Care Jovaughn Wojtaszek: Park Meo Other Clinician: Referring Atasha Colebank: Treating Punam Broussard/Extender:Robson, Edythe Clarity, Campbell Riches in Treatment: 0 Encounter Discharge Information Items Discharge Condition: Stable Ambulatory Status: Wheelchair Discharge Destination: Home Transportation: Private Auto Accompanied By: self Schedule Follow-up Appointment: Yes Clinical Summary of Care: Electronic Signature(s) Signed: 06/28/2019 5:41:48 PM By: [name] Entered By: [name] on 06/28/2019 17:05:51 -------------------------------------------------------------------------------- Lower Extremity Assessment Details Patient Name: Date of Service: JOANNE, NOSBISCH 06/28/2019 2:45 PM Medical Record [id number] Patient Account Number: [id number] Date of Birth/Sex: Treating RN: 1966-04-18 (53 y.o. Jerilynn Mages) Carlene Coria Primary Care Omarion Minnehan: Park Meo Other Clinician: Referring Kijuan Gallicchio: Treating Cindel Daugherty/Extender:Robson, Edythe Clarity, Campbell Riches in Treatment: 0 Edema Assessment Assessed: [Left: No] [Right: No] Edema: [Left: No] [Right: Yes] Calf Left: Right: Point of Measurement: cm From Medial Instep 31 cm 38 cm Ankle Left: Right: Point of Measurement: cm From Medial Instep 20 cm 26 cm Vascular Assessment Pulses: Dorsalis Pedis Palpable: [Left:Yes] [Right:Yes] Electronic Signature(s) Signed: 06/28/2019 5:18:45 PM By: Carlene Coria RN Entered By: Carlene Coria on 06/28/2019 15:20:33 -------------------------------------------------------------------------------- Multi Wound Chart Details Patient Name: Date of Service: Blackston, Toben T. 06/28/2019 2:45 PM Medical Record [id number] Patient Account Number: [id number] Date of Birth/Sex: Treating RN: 09-Jul-1965 (54 y.o. Marvis Repress Primary Care Deacon Gadbois:  Park Meo Other Clinician: Referring Madelina Sanda: Treating Bereket Gernert/Extender:Robson, Edythe Clarity, Campbell Riches in Treatment: 0 Vital Signs Height(in): 72 Pulse(bpm): 112 Weight(lbs): 157 Blood Pressure(mmHg): 104/76 Body Mass Index(BMI): 21 Temperature(F): 98.1 Respiratory 18 Rate(breaths/min): Photos: [75:No Photos] [76:No Photos] [77:No Photos] Wound Location: [75:Right Abdomen - Lower Quadrant] [76:Right Trochanter] [77:Right Upper Leg - Anterior] Wounding Event: [75:Gradually Appeared] [76:Gradually Appeared] [77:Gradually Appeared] 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] [76:Pressure Ulcer] [77:Pressure Ulcer] Comorbid History: [75:Anemia, Sleep Apnea, Hypertension, Osteomyelitis, Quadriplegia, Osteomyelitis, Quadriplegia, Osteomyelitis, Quadriplegia, Seizure Disorder] [76:Anemia, Sleep Apnea, Hypertension, Seizure Disorder] [77:Anemia, Sleep Apnea,  Hypertension, Seizure Disorder] Date Acquired: [75:05/17/2019] [76:05/17/2019] [77:05/17/2019] Weeks  of Treatment: [75:0] [76:0] [77:0] Wound Status: [75:Open] [76:Open] [77:Open] Clustered Wound: [75:No] [76:No] [77:No] Clustered Quantity: [75:N/A] [76:N/A] [77:N/A] Measurements L x W x D 3.4x3.3x0.8 [76:0.7x0.5x0.3] [77:0.7x0.5x0.3] (cm) Area (cm) : [75:8.812] [76:0.275] [77:0.275] Volume (cm) : [75:7.05] [76:0.082] [77:0.082] % Reduction in Area: [75:N/A] [76:0.00%] [77:0.00%] % Reduction in Volume: N/A [76:0.00%] [77:0.00%] Undermining: [75:No] [76:No] [77:No] Classification: [75:Category/Stage III] [76:Unstageable/Unclassified] [77:Category/Stage III] Exudate Amount: [75:Medium] [76:Small] [77:Medium] Exudate Type: [75:Serosanguineous] [76:Serosanguineous] [77:Serosanguineous] Exudate Color: [75:red, brown] [76:red, brown] [77:red, brown] Foul Odor After Cleansing:No [76:No] [77:No] Odor Anticipated Due to N/A [76:N/A] [77:N/A] Product Use: Wound Margin: [75:N/A] [76:N/A] [77:Flat and Intact] Granulation Amount:  [75:Medium (34-66%)] [76:None Present (0%)] [77:Medium (34-66%)] Granulation Quality: [75:Pink] [76:N/A] [77:Red] Necrotic Amount: [75:Medium (34-66%)] [76:Large (67-100%)] [77:Medium (34-66%)] Necrotic Tissue: [75:Adherent Slough] [76:Eschar, Adherent Slough] [77:Adherent Slough] Exposed Structures: [75:Fat Layer (Subcutaneous Tissue) Exposed: Yes Fascia: No Tendon: No Muscle: No Joint: No Bone: No] [76:Fascia: No Fat Layer (Subcutaneous Tissue) Exposed: No Tendon: No Muscle: No Joint: No Bone: No] [77:Fat Layer (Subcutaneous Tissue) Exposed: Yes  Fascia: No Tendon: No Muscle: No Joint: No Bone: No] Epithelialization: [75:None 27] [76:None 14] [77:None 80] Photos: [75:No Photos] [76:No Photos] [77:No Photos] Wound Location: [75:Sacrum] [76:Right Calcaneus] [77:Left Metatarsal head first] Wounding Event: [75:Gradually Appeared] [76:Gradually Appeared] [77:Gradually Appeared] 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] [76:Pressure Ulcer] [77:Pressure Ulcer] Comorbid History: [75:Anemia, Sleep Apnea, Hypertension, Osteomyelitis, Quadriplegia, Osteomyelitis, Quadriplegia, Osteomyelitis, Quadriplegia, Seizure Disorder] [76:Anemia, Sleep Apnea, Hypertension, Seizure Disorder] [77:Anemia, Sleep Apnea,  Hypertension, Seizure Disorder] Date Acquired: [75:03/16/2018] [76:05/17/2019] [77:05/17/2019] Weeks of Treatment: [75:0] [76:0] [77:0] Wound Status: [75:Open] [76:Open] [77:Open] Clustered Wound: [75:No] [76:No] [77:No] Clustered Quantity: [75:N/A] [76:N/A] [77:N/A] Measurements L x W x D 21x28x7.5 [76:4x2x0.1] [77:2.5x2x0.3] (cm) Area (cm) : [75:461.814] [76:6.283] [77:3.927] Volume (cm) : [id number] [76:0.628] [77:1.178] % Reduction in Area: [75:0.00%] [76:N/A] [77:N/A] % Reduction in Volume: 0.00% [76:N/A] [77:N/A] Undermining: [75:No] [76:No] [77:No] Classification: [75:Category/Stage IV] [76:Unstageable/Unclassified Category/Stage III] Exudate Amount: [75:None Present] [76:None Present]  [77:Medium] Exudate Type: [75:N/A] [76:N/A] [77:Serosanguineous] Exudate Color: [75:N/A] [76:N/A] [77:red, brown] Foul Odor After Cleansing:No [76:No] [77:No] Odor Anticipated Due to N/A [76:N/A] [77:N/A] Product Use: Wound Margin: [75:N/A] [76:N/A] [77:N/A] Granulation Amount: [75:Medium (34-66%)] [76:N/A] [77:Small (1-33%)] Granulation Quality: [75:Red, Pink] [76:N/A] [77:Pink, Pale] Necrotic Amount: [75:Medium (34-66%)] [76:N/A] [77:Large (67-100%)] Necrotic Tissue: [75:Eschar, Adherent Slough] [76:Eschar, Adherent Slough] [77:Eschar, Adherent Slough] Exposed Structures: [75:Fat Layer (Subcutaneous Tissue) Exposed: Yes Bone: Yes Fascia: No Tendon: No Muscle: No Joint: No] [76:Fat Layer (Subcutaneous Tissue) Exposed: Yes Fascia: No Tendon: No Muscle: No Joint: No Bone: No] [77:Fat Layer (Subcutaneous Tissue) Exposed: Yes  Fascia: No Tendon: No Muscle: No Joint: No Bone: No] Epithelialization: [75:None 81] [76:None 76] [77:None 83] Photos: [75:No Photos] [76:No Photos] [77:No Photos] Wound Location: [75:Right Trochanter] [76:Right Trochanter - Anterior Right Chest] Wounding Event: [75:Pressure Injury] [76:Pressure Injury] [77:Pressure Injury] 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5x0.1] [77:10.9x4.6x0.3] (cm) Area (cm) : [75:2.199] [76:0.942] [77:39.38] Volume (cm) : [75:0.22] [76:0.094] [77:11.814] %  Reduction in Area: [75:N/A] [76:N/A] [77:N/A] % Reduction in Volume: N/A [76:N/A] [77:N/A] Undermining: [75:No] [76:No] [77:No] Classification: [75:Category/Stage III] [76:Category/Stage III] [77:Category/Stage IV] Exudate Amount: [75:Small] [76:Small] [77:Medium] Exudate Type: [75:Serosanguineous] [76:Serosanguineous] [77:Purulent]  Exudate Color: [75:red, brown] [76:red, brown] [77:yellow, brown, green] Foul Odor After Cleansing:No [76:N/A] [77:No] Odor Anticipated Due to N/A [76:N/A] [77:N/A] Product Use: Wound Margin: [75:Flat and Intact] [76:Flat and Intact] [77:Flat and Intact] Granulation Amount: [75:Large (67-100%)] [76:Small (1-33%)] [77:Medium (34-66%)] Granulation Quality: [75:Red, Pink] [76:Red] [77:Red, Pink] Necrotic Amount: [75:None Present (0%)] [76:Large (67-100%)] [77:Medium (34-66%)] Necrotic Tissue: [75:N/A] [76:Adherent Slough] [77:Adherent Slough] Exposed Structures: [75:Fat Layer (Subcutaneous Fat Layer (Subcutaneous Fat Layer (Subcutaneous Tissue) Exposed: Yes Fascia: No Tendon: No Muscle: No Joint: No Bone: No] [76:Tissue) Exposed: Yes Fascia: No Tendon: No Muscle: No Joint: No Bone: No] [77:Tissue) Exposed: Yes  Fascia: No Tendon: No Muscle: No Joint: No Bone: No] Epithelialization: [75:Small (1-33%) 84] [76:Small (1-33%) 85] [77:Small (1-33%) 86] Photos: [75:No Photos] [76:No Photos] [77:No Photos] Wound Location: [75:Right Back] [76:Right Flank] [77:Right Flank - Inferior] Wounding Event: [75:Pressure Injury] [76:Pressure Injury] [77:Pressure Injury] 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] [76:Pressure Ulcer] [77:Pressure Ulcer] Comorbid History: [75:Anemia, Sleep Apnea, Hypertension, Osteomyelitis, Quadriplegia, Osteomyelitis, Quadriplegia, Osteomyelitis, Quadriplegia, Seizure Disorder] [76:Anemia, Sleep Apnea, Hypertension, Seizure Disorder] [77:Anemia, Sleep Apnea,  Hypertension, Seizure Disorder] Date Acquired: [75:05/17/2019] [76:06/17/2019] [77:06/17/2019] Weeks of  Treatment: [75:0] [76:0] [77:0] Wound Status: [75:Open] [76:Open] [77:Open] Clustered Wound: [75:Yes] [76:No] [77:No] Clustered Quantity: [75:2] [76:N/A] [77:N/A] Measurements L x W x D 2.7x7.1x0.1 [76:2.9x2.4x0.1] [77:4x4x1.2] (cm) Area (cm) : [75:15.056] [76:5.466] [77:12.566] Volume (cm) : [75:1.506] [76:0.547] [77:15.08] % Reduction in Area: [75:N/A] [76:N/A] [77:N/A] % Reduction in Volume: N/A [76:N/A] [77:N/A] Starting Position 1 [77:10] (o'clock): Ending Position 1 [77:12] (o'clock): Maximum Distance 1 [77:2.1] (cm): Undermining: [75:No] [76:No] [77:Yes] Classification: [75:Category/Stage III] [76:Unstageable/Unclassified Category/Stage IV] Exudate Amount: [75:Medium] [76:Small] [77:Medium] Exudate Type: [75:Purulent] [76:Serosanguineous] [77:Purulent] Exudate Color: [75:yellow, brown, green] [76:red, brown] [77:yellow, brown, green] Foul Odor After Cleansing:No [76:No] [77:Yes] Odor Anticipated Due to N/A [76:N/A] [77:No] Product Use: Wound Margin: [75:Flat and Intact] [76:Flat and Intact] [77:Flat and Intact] Granulation Amount: [75:Small (1-33%)] [76:None Present (0%)] [77:Small (1-33%)] Granulation Quality: [75:Red] [76:N/A] [77:Pink] Necrotic Amount: [75:Large (67-100%)] [76:Large (67-100%)] [77:Large (67-100%)] Necrotic Tissue: [75:Adherent Slough] [76:Eschar, Adherent Slough Adherent Slough] Exposed Structures: [75:Fat Layer (Subcutaneous Fat Layer (Subcutaneous Fat Layer (Subcutaneous Tissue) Exposed: Yes Fascia: No Tendon: No Muscle: No Joint: No Bone: No Small (1-33%)] [76:Tissue) Exposed: Yes Fascia: No Tendon: No Muscle: No Joint: No Bone: No Small  (1-33%)] [77:Tissue) Exposed: Yes Bone: Yes Fascia: No Tendon: No Muscle: No Joint: No N/A] Treatment Notes Wound #75 (Right Abdomen - Lower Quadrant) 1. Cleanse With Wound Cleanser 3. Primary Dressing Applied Calcium Alginate Ag 4. Secondary Dressing ABD Pad 5. Secured With Medipore tape Wound #76 (Right  Trochanter) 1. Cleanse With Wound Cleanser 3. Primary Dressing Applied Calcium Alginate Ag 4. Secondary Dressing ABD Pad 5. Secured With Medipore tape Wound #77 (Right, Anterior Upper Leg) 1. Cleanse With Wound Cleanser 3. Primary Dressing Applied Calcium Alginate Ag 4. Secondary Dressing ABD Pad 5. Secured With Medipore tape Wound #78 (Sacrum) 1. Cleanse With Wound Cleanser 3. Primary Dressing Applied Calcium Alginate Ag 4. Secondary Dressing ABD Pad 5. Secured With Medipore tape Wound #79 (Right Calcaneus) 1. Cleanse With Wound Cleanser 3. Primary Dressing Applied Calcium Alginate Ag 4. Secondary Dressing ABD Pad 5. Secured With Medipore tape Wound #80 (Left Metatarsal head first) 1. Cleanse With Wound Cleanser 3. Primary Dressing Applied Calcium Alginate Ag 4. Secondary Dressing ABD Pad 5. Secured With Medipore tape Wound #81 (Right Trochanter) 1. Cleanse With Wound Cleanser 3. Primary Dressing Applied Calcium Alginate Ag 4. Secondary Dressing ABD Pad 5. Secured With Medipore tape Wound #  82 (Right, Anterior Trochanter) 1. Cleanse With Wound Cleanser 3. Primary Dressing Applied Calcium Alginate Ag 4. Secondary Dressing ABD Pad 5. Secured With Medipore tape Wound #83 (Right Chest) 1. Cleanse With Wound Cleanser 3. Primary Dressing Applied Calcium Alginate Ag 4. Secondary Dressing ABD Pad 5. Secured With Medipore tape Wound #84 (Right Back) 1. Cleanse With Wound Cleanser 3. Primary Dressing Applied Calcium Alginate Ag 4. Secondary Dressing ABD Pad 5. Secured With Medipore tape Wound #85 (Right Flank) 1. Cleanse With Wound Cleanser 3. Primary Dressing Applied Calcium Alginate Ag 4. Secondary Dressing ABD Pad 5. Secured With Medipore tape Wound #86 (Right, Inferior Flank) 1. Cleanse With Wound Cleanser 3. Primary Dressing Applied Calcium Alginate Ag 4. Secondary Dressing ABD Pad 5. Secured With [name]) Signed: 06/28/2019 5:45:04 PM By: Linton Ham MD Signed: 07/01/2019 6:12:41 PM By: Kela Millin Entered By: Linton Ham on 06/28/2019 17:34:15 -------------------------------------------------------------------------------- Multi-Disciplinary Care Plan Details Patient Name: Date of Service: [name] 06/28/2019 2:45 PM Medical Record [id number] Patient Account Number: [id number] Date of Birth/Sex: Treating RN: August 15, 1965 (54 y.o. Marvis Repress Primary Care Marilyne Haseley: Park Meo Other Clinician: Referring Jakaylee Sasaki: Treating Gilverto Dileonardo/Extender:Robson, Edythe Clarity, Campbell Riches in Treatment: 0 Active Inactive Pain, Acute or Chronic Nursing Diagnoses: Pain, acute or chronic: actual or potential Goals: Patient/caregiver will verbalize adequate pain control between visits Date Initiated: 06/28/2019 Target Resolution Date: 08/30/2019 Goal Status: Active Interventions: Provide education on pain management Notes: Pressure Nursing Diagnoses: Knowledge deficit related to causes and risk factors for pressure ulcer development Goals: Patient/caregiver will verbalize risk factors for pressure ulcer development Date Initiated: 06/28/2019 Target Resolution Date: 08/30/2019 Goal Status: Active Interventions: Provide education on pressure ulcers Notes: Wound/Skin Impairment Nursing Diagnoses: Impaired tissue integrity Goals: Patient/caregiver will verbalize understanding of skin care regimen Date Initiated: 06/28/2019 Target Resolution Date: 08/30/2019 Goal Status: Active Interventions: Provide education on ulcer and skin care Notes: Electronic Signature(s) Signed: 07/01/2019 6:12:41 PM By: Kela Millin Entered By: Kela Millin on 06/28/2019 16:13:59 -------------------------------------------------------------------------------- Pain Assessment Details Patient Name: Date of Service: DIONYSIUS, CAMBRAY 06/28/2019 2:45  PM Medical Record [id number] Patient Account Number: [id number] Date of Birth/Sex: Treating RN: Jun 20, 1965 (53 y.o. Oval Linsey Primary Care Emiliya Chretien: Park Meo Other Clinician: Referring [place] Maulden: Treating Johnell Landowski/Extender:Robson, Edythe Clarity, Campbell Riches in Treatment: 0 Active Problems Location of Pain Severity and Description of Pain Patient Has Paino No Site Locations Rate the pain. Current Pain Level: 0 Pain Management and Medication Current Pain Management: Electronic Signature(s) Signed: 06/28/2019 5:18:45 PM By: Carlene Coria RN Entered By: Carlene Coria on 06/28/2019 15:20:45 -------------------------------------------------------------------------------- Patient/Caregiver Education Details Patient Name: Date of Service: [name] 2/12/2021andnbsp2:45 PM Medical Record [phone] Patient Account Number: [id number] Date of Birth/Gender: 12/25/1965 (53 y.o. M) Treating RN: Kela Millin Primary Care Physician: Park Meo Other Clinician: Referring Physician: Treating Physician/Extender:Robson, Edythe Clarity, Campbell Riches in Treatment: 0 Education Assessment Education Provided To: Patient Education Topics Provided Pain: Methods: Explain/Verbal Responses: State content correctly Pressure: Methods: Explain/Verbal Responses: State content correctly Wound/Skin Impairment: Methods: Explain/Verbal Responses: State content correctly Electronic Signature(s) Signed: 07/01/2019 6:12:41 PM By: Kela Millin Entered By: Kela Millin on 06/28/2019 16:14:15 -------------------------------------------------------------------------------- Wound Assessment Details Patient Name: Date of Service: [name]. 06/28/2019 2:45 PM Medical Record [id number] Patient Account Number: [id number] Date of Birth/Sex: Treating RN: 1965/08/21 (54 y.o. Marvis Repress Primary Care Nykeem Citro: Park Meo Other  Clinician: Referring Semaje Kinker: Treating Krystina Strieter/Extender:Robson, Edythe Clarity, Suella [place] Weeks in Treatment: 0 Wound Status Wound Number: 75 Primary Pressure Ulcer Etiology: Wound  Location: Right Abdomen - Lower Quadrant Wound Open Wounding Event: Gradually Appeared Status: Date Acquired: 05/17/2019 Comorbid Anemia, Sleep Apnea, Hypertension, Weeks Of Treatment: 0 History: Osteomyelitis, Quadriplegia, Seizure Disorder Clustered Wound: No Photos Wound Measurements Length: (cm) 3.4 % Reducti Width: (cm) 3.3 % Reducti Depth: (cm) 0.8 Epithelia Area: (cm) 8.812 Tunnelin Volume: (cm) 7.05 Undermin Wound Description Classification: Category/Stage III Exudate Amount: Medium Exudate Type: Serosanguineous Exudate Color: red, brown Wound Bed Granulation Amount: Medium (34-66%) Granulation Quality: Pink Necrotic Amount: Medium (34-66%) Necrotic Quality: Adherent Slough Foul Odor After Cleansing: No Slough/Fibrino Yes Exposed Structure Fascia Exposed: No Fat Layer (Subcutaneous Tissue) Exposed: Yes Tendon Exposed: No Muscle Exposed: No Joint Exposed: No Bone Exposed: No on in Area: 0% on in Volume: 0% lization: None g: No ing: No Treatment Notes Wound #75 (Right Abdomen - Lower Quadrant) 1. Cleanse With Wound Cleanser 3. Primary Dressing Applied Calcium Alginate Ag 4. Secondary Dressing ABD Pad 5. Secured With [facility]) Signed: 07/01/2019 4:24:03 PM By: Mikeal Hawthorne EMT/HBOT Signed: 07/01/2019 6:12:41 PM By: Kela Millin Previous Signature: 06/28/2019 5:18:45 PM Version By: Carlene Coria RN Entered By: Mikeal Hawthorne on 07/01/2019 14:33:44 -------------------------------------------------------------------------------- Wound Assessment Details Patient Name: Date of Service: Vitali, Arlene T. 06/28/2019 2:45 PM Medical Record [id number] Patient Account Number: [id number] Date of Birth/Sex: Treating RN: Aug 08, 1965 (54 y.o. Marvis Repress Primary Care Brin Ruggerio: Park Meo Other Clinician: Referring Khiana Camino: Treating Kaydee Magel/Extender:Robson, Edythe Clarity, Campbell Riches in Treatment: 0 Wound Status Wound Number: 76 Primary Pressure Ulcer Etiology: Wound Location: Right Trochanter Wound Open Wounding Event: Gradually Appeared Status: Date Acquired: 05/17/2019 Comorbid Anemia, Sleep Apnea, Hypertension, Weeks Of Treatment: 0 History: Osteomyelitis, Quadriplegia, Seizure Disorder Clustered Wound: No Photos Wound Measurements Length: (cm) 0.7 % Reduction in A Width: (cm) 0.5 % Reduction in V Depth: (cm) 0.3 Epithelializatio Area: (cm) 0.275 Tunneling: Volume: (cm) 0.082 Undermining: Wound Description Classification: Unstageable/Unclassified Foul Odor After Exudate Amount: Small Slough/Fibrino Exudate Type: Serosanguineous Exudate Color: red, brown Wound Bed Granulation Amount: None Present (0%) Necrotic Amount: Large (67-100%) Fascia Exposed: Necrotic Quality: Eschar, Adherent Slough Fat Layer (Subcu Tendon Exposed: Muscle Exposed: Joint Exposed: Bone Exposed: Cleansing: No Yes Exposed Structure No taneous Tissue) Exposed: No No No No No rea: 0% olume: 0% n: None No No Treatment Notes Wound #76 (Right Trochanter) 1. Cleanse With Wound Cleanser 3. Primary Dressing Applied Calcium Alginate Ag 4. Secondary Dressing ABD Pad 5. Secured With [facility]) Signed: 07/01/2019 4:24:03 PM By: Mikeal Hawthorne EMT/HBOT Signed: 07/01/2019 6:12:41 PM By: Kela Millin Previous Signature: 06/28/2019 5:18:45 PM Version By: Carlene Coria RN Entered By: Mikeal Hawthorne on 07/01/2019 14:32:34 -------------------------------------------------------------------------------- Wound Assessment Details Patient Name: Date of Service: Mcneill, Tyion T. 06/28/2019 2:45 PM Medical Record [id number] Patient Account Number: [id number] Date of  Birth/Sex: Treating RN: 1966-03-02 (54 y.o. Marvis Repress Primary Care Kellyn Mccary: Park Meo Other Clinician: Referring Ivis Nicolson: Treating Conley Delisle/Extender:Robson, Edythe Clarity, Campbell Riches in Treatment: 0 Wound Status Wound Number: 77 Primary Pressure Ulcer Etiology: Wound Location: Right, Anterior Upper Leg Wound Open Wounding Event: Gradually Appeared Status: Date Acquired: 05/17/2019 Comorbid Anemia, Sleep Apnea, Hypertension, Weeks Of Treatment: 0 History: Osteomyelitis, Quadriplegia, Seizure Disorder Clustered Wound: No Wound Measurements Length: (cm) 0.7 Width: (cm) 0.5 Depth: (cm) 0.3 Area: (cm) 0.275 Volume: (cm) 0.082 Wound Description Classification: Category/Stage III Wound Margin: Flat and Intact Exudate Amount: Medium Exudate Type: Serosanguineous Exudate Color: red, brown Wound Bed Granulation Amount: Medium (34-66%) Granulation Quality: Red Necrotic Amount: Medium (34-66%) Necrotic Quality: Adherent Slough After  Cleansing: No brino Yes Exposed Structure posed: No (Subcutaneous Tissue) Exposed: Yes posed: No posed: No osed: No sed: No % Reduction in Area: 0% % Reduction in Volume: 0% Epithelialization: None Tunneling: No Undermining: No Foul Odor Slough/Fi Fascia Ex Fat Layer Tendon Ex Muscle Ex Joint Exp Bone Expo Treatment Notes Wound #77 (Right, Anterior Upper Leg) 1. Cleanse With Wound Cleanser 3. Primary Dressing Applied Calcium Alginate Ag 4. Secondary Dressing ABD Pad 5. Secured With [facility]) Signed: 07/01/2019 4:24:03 PM By: Mikeal Hawthorne EMT/HBOT Signed: 07/01/2019 6:12:41 PM By: Kela Millin Previous Signature: 06/28/2019 5:18:45 PM Version By: Carlene Coria RN Entered By: Mikeal Hawthorne on 07/01/2019 14:30:47 -------------------------------------------------------------------------------- Wound Assessment Details Patient Name: Date of Service: Postlethwait, Reyden T.  06/28/2019 2:45 PM Medical Record [id number] Patient Account Number: [id number] Date of Birth/Sex: Treating RN: 24-Feb-1966 (54 y.o. Marvis Repress Primary Care Javon Snee: Park Meo Other Clinician: Referring Sylvester Salonga: Treating Ferron Ishmael/Extender:Robson, Edythe Clarity, Campbell Riches in Treatment: 0 Wound Status Wound Number: 78 Primary Pressure Ulcer Etiology: Wound Location: Sacrum Wound Open Wounding Event: Gradually Appeared Status: Date Acquired: 03/16/2018 Comorbid Anemia, Sleep Apnea, Hypertension, Weeks Of Treatment: 0 History: Osteomyelitis, Quadriplegia, Seizure Disorder Clustered Wound: No Photos Wound Measurements Length: (cm) 21 Width: (cm) 28 Depth: (cm) 7.5 Area: (cm) 461.814 Volume: (cm) 3463.606 Wound Description Classification: Category/Stage IV Exudate Amount: None Present Foul Odor After Cleansing: No Slough/Fibrino Ye % Reduction in Area: 0% % Reduction in Volume: 0% Epithelialization: None Tunneling: No Undermining: No s Wound Bed Granulation Amount: Medium (34-66%) Exposed Structure Granulation Quality: Red, Pink Fascia Exposed: No Necrotic Amount: Medium (34-66%) Fat Layer (Subcutaneous Tissue) Exposed: Yes Necrotic Quality: Eschar, Adherent Slough Tendon Exposed: No Muscle Exposed: No Joint Exposed: No Bone Exposed: Yes Treatment Notes Wound #78 (Sacrum) 1. Cleanse With Wound Cleanser 3. Primary Dressing Applied Calcium Alginate Ag 4. Secondary Dressing ABD Pad 5. Secured With [facility]) Signed: 07/01/2019 4:24:03 PM By: Mikeal Hawthorne EMT/HBOT Signed: 07/01/2019 6:12:41 PM By: Kela Millin Previous Signature: 06/28/2019 5:18:45 PM Version By: Carlene Coria RN Entered By: Mikeal Hawthorne on 07/01/2019 14:33:11 -------------------------------------------------------------------------------- Wound Assessment Details Patient Name: Date of Service: Farro, Kiaan T. 06/28/2019 2:45  PM Medical Record [id number] Patient Account Number: [id number] Date of Birth/Sex: Treating RN: [birth year] (54 y.o. Marvis Repress Primary Care Shaquaya Wuellner: Park Meo Other Clinician: Referring Harshith Pursell: Treating Lillard Bailon/Extender:Robson, Edythe Clarity, Campbell Riches in Treatment: 0 Wound Status Wound Number: 79 Primary Pressure Ulcer Etiology: Wound Location: Right Calcaneus Wound Open Wounding Event: Gradually Appeared Status: Date Acquired: 05/17/2019 Comorbid Anemia, Sleep Apnea, Hypertension, Weeks Of Treatment: 0 History: Osteomyelitis, Quadriplegia, Seizure Disorder Clustered Wound: No Photos Wound Measurements Length: (cm) 4 Width: (cm) 2 Depth: (cm) 0.1 Area: (cm) 6.283 Volume: (cm) 0.628 Wound Description Classification: Unstageable/Unclassified Exudate Amount: None Present Foul Odor After Cleansing: No Slough/Fibrino Ye % Reduction in Area: 0% % Reduction in Volume: 0% Epithelialization: None Tunneling: No Undermining: No s Wound Bed Necrotic Amount: Large (67-100%) Exposed Structure Necrotic Quality: Eschar, Adherent Slough Fascia Exposed: No Fat Layer (Subcutaneous Tissue) Exposed: Yes Tendon Exposed: No Muscle Exposed: No Joint Exposed: No Bone Exposed: No Treatment Notes Wound #79 (Right Calcaneus) 1. Cleanse With Wound Cleanser 3. Primary Dressing Applied Calcium Alginate Ag 4. Secondary Dressing ABD Pad 5. Secured With [facility]) Signed: 07/01/2019 4:24:03 PM By: Mikeal Hawthorne EMT/HBOT Signed: 07/01/2019 6:12:41 PM By: Kela Millin Previous Signature: 06/28/2019 5:18:45 PM Version By: Carlene Coria RN Entered By: Mikeal Hawthorne on 07/01/2019 14:32:10 -------------------------------------------------------------------------------- Wound  Assessment Details Patient Name: Date of Service: EZEQUIAS, WALTHER 06/28/2019 2:45 PM Medical Record [id number] Patient Account Number:  [id number] Date of Birth/Sex: Treating RN: [birth year] (54 y.o. Marvis Repress Primary Care Myrick Mcnairy: Park Meo Other Clinician: Referring Chasady Longwell: Treating Colbey Wirtanen/Extender:Robson, Edythe Clarity, Campbell Riches in Treatment: 0 Wound Status Wound Number: 80 Primary Pressure Ulcer Etiology: Wound Location: Left Metatarsal head first Wound Open Wounding Event: Gradually Appeared Status: Date Acquired: 05/17/2019 Date Acquired: 05/17/2019 Comorbid Anemia, Sleep Apnea, Hypertension, Weeks Of Treatment: 0 History: Osteomyelitis, Quadriplegia, Seizure Disorder Clustered Wound: No Photos Wound Measurements Length: (cm) 2.5 Width: (cm) 2 Depth: (cm) 0.3 Area: (cm) 3.927 Volume: (cm) 1.178 Wound Description Classification: Category/Stage III Exudate Amount: Medium Exudate Type: Serosanguineous Exudate Color: red, brown Wound Bed Granulation Amount: Small (1-33%) Granulation Quality: Pink, Pale Necrotic Amount: Large (67-100%) Necrotic Quality: Eschar, Adherent Slough r After Cleansing: No ibrino Yes Exposed Structure posed: No (Subcutaneous Tissue) Exposed: Yes posed: No posed: No osed: No sed: No % Reduction in Area: 0% % Reduction in Volume: 0% Epithelialization: None Tunneling: No Undermining: No Foul Odo Slough/F Fascia Ex Fat Layer Tendon Ex Muscle Ex Joint Exp Bone Expo Treatment Notes Wound #80 (Left Metatarsal head first) 1. Cleanse With Wound Cleanser 3. Primary Dressing Applied Calcium Alginate Ag 4. Secondary Dressing ABD Pad 5. Secured With [facility]) Signed: 07/01/2019 4:24:03 PM By: Mikeal Hawthorne EMT/HBOT Signed: 07/01/2019 6:12:41 PM By: Kela Millin Previous Signature: 06/28/2019 5:18:45 PM Version By: Carlene Coria RN Entered By: Mikeal Hawthorne on 07/01/2019 14:31:43 -------------------------------------------------------------------------------- Wound Assessment Details Patient Name: Date  of Service: Raval, Izyan T. 06/28/2019 2:45 PM Medical Record [id number] Patient Account Number: [id number] Date of Birth/Sex: Treating RN: 24-May-1965 (54 y.o. Marvis Repress Primary Care Taura Lamarre: Park Meo Other Clinician: Referring Cathalina Barcia: Treating Tenasia Aull/Extender:Robson, Edythe Clarity, Campbell Riches in Treatment: 0 Wound Status Wound Number: 81 Primary Pressure Ulcer Etiology: Wound Location: Right Trochanter Wound Open Wounding Event: Pressure Injury Status: Date Acquired: 05/17/2019 Comorbid Anemia, Sleep Apnea, Hypertension, Weeks Of Treatment: 0 History: Osteomyelitis, Quadriplegia, Seizure Disorder Clustered Wound: No Photos Wound Measurements Length: (cm) 0.8 % Reduction in Are Width: (cm) 3.5 % Reduction in Vol Depth: (cm) 0.1 Epithelialization: Area: (cm) 2.199 Tunneling: Volume: (cm) 0.22 Undermining: Wound Description Classification: Category/Stage III Foul Odor After Cl Wound Margin: Flat and Intact Slough/Fibrino Exudate Amount: Small Exudate Type: Serosanguineous Exudate Color: red, brown Wound Bed Granulation Amount: Large (67-100%) E Granulation Quality: Red, Pink Fascia Exposed: Necrotic Amount: None Present (0%) Fat Layer (Subcuta Tendon Exposed: Muscle Exposed: Joint Exposed: Bone Exposed: eansing: No No xposed Structure No neous Tissue) Exposed: Yes No No No No a: 0% ume: 0% Small (1-33%) No No Treatment Notes Wound #81 (Right Trochanter) 1. Cleanse With Wound Cleanser 3. Primary Dressing Applied Calcium Alginate Ag 4. Secondary Dressing ABD Pad 5. Secured With [facility]) Signed: 07/01/2019 4:24:03 PM By: Mikeal Hawthorne EMT/HBOT Signed: 07/01/2019 6:12:41 PM By: Kela Millin Previous Signature: 06/28/2019 5:18:45 PM Version By: Carlene Coria RN Entered By: Mikeal Hawthorne on 07/01/2019  14:34:11 -------------------------------------------------------------------------------- Wound Assessment Details Patient Name: Date of Service: Tritch, Fabyan T. 06/28/2019 2:45 PM Medical Record [id number] Patient Account Number: [id number] Date of Birth/Sex: Treating RN: Sep 27, 1965 (54 y.o. Marvis Repress Primary Care Tyquasia Pant: Park Meo Other Clinician: Referring Ceaser Ebeling: Treating Raye Wiens/Extender:Robson, Edythe Clarity, Suella Grove Weeks in Treatment: 0 Wound Status Wound Number: [id number] Primary Pressure Ulcer Etiology: Wound Location: Right Trochanter - Anterior Wound Open Wounding Event: Pressure Injury Status: Date Acquired: 05/17/2019  Comorbid Anemia, Sleep Apnea, Hypertension, Weeks Of Treatment: 0 History: Osteomyelitis, Quadriplegia, Seizure Disorder Clustered Wound: No Photos Wound Measurements Length: (cm) 0.8 % Reducti Width: (cm) 1.5 % Reducti Depth: (cm) 0.1 Epithelia Area: (cm) 0.942 Tunnelin Volume: (cm) 0.094 Undermin Wound Description Classification: Category/Stage III Wound Margin: Flat and Intact Exudate Amount: Small Exudate Type: Serosanguineous Exudate Color: red, brown Wound Bed Granulation Amount: Small (1-33%) Granulation Quality: Red Necrotic Amount: Large (67-100%) Necrotic Quality: Adherent Slough Exposed Structure Fascia Exposed: No Fat Layer (Subcutaneous Tissue) Exposed: Yes Tendon Exposed: No Muscle Exposed: No Joint Exposed: No Bone Exposed: No on in Area: 0% on in Volume: 0% lization: Small (1-33%) g: No ing: No Treatment Notes Wound #82 (Right, Anterior Trochanter) 1. Cleanse With Wound Cleanser 3. Primary Dressing Applied Calcium Alginate Ag 4. Secondary Dressing ABD Pad 5. Secured With [facility]) Signed: 07/01/2019 4:24:03 PM By: Mikeal Hawthorne EMT/HBOT Signed: 07/01/2019 6:12:41 PM By: Kela Millin Previous Signature: 06/28/2019 5:18:45 PM Version By: Carlene Coria  RN Entered By: Mikeal Hawthorne on 07/01/2019 14:35:57 -------------------------------------------------------------------------------- Wound Assessment Details Patient Name: Date of Service: Iwata, Oisin T. 06/28/2019 2:45 PM Medical Record [id number] Patient Account Number: [id number] Date of Birth/Sex: Treating RN: Apr 23, 1966 (54 y.o. Marvis Repress Primary Care Mela Perham: Park Meo Other Clinician: Referring Dallis Darden: Treating Kimyetta Flott/Extender:Robson, Edythe Clarity, Campbell Riches in Treatment: 0 Wound Status Wound Number: 83 Primary Pressure Ulcer Etiology: Wound Location: Right Chest Wound Open Wounding Event: Pressure Injury Status: Date Acquired: 06/27/2018 Comorbid Anemia, Sleep Apnea, Hypertension, Weeks Of Treatment: 0 History: Osteomyelitis, Quadriplegia, Seizure Disorder Clustered Wound: No Photos Wound Measurements Length: (cm) 10.9 % Reducti Width: (cm) 4.6 % Reducti Depth: (cm) 0.3 Epithelia Area: (cm) 39.38 Tunnelin Volume: (cm) 11.814 Undermin Wound Description Classification: Category/Stage IV Wound Margin: Flat and Intact Exudate Amount: Medium Exudate Type: Purulent Exudate Color: yellow, brown, green Wound Bed Granulation Amount: Medium (34-66%) Granulation Quality: Red, Pink Necrotic Amount: Medium (34-66%) Necrotic Quality: Adherent Slough Foul Odor After Cleansing: No Slough/Fibrino Yes Exposed Structure Fascia Exposed: No Fat Layer (Subcutaneous Tissue) Exposed: Yes Tendon Exposed: No Muscle Exposed: No Joint Exposed: No Bone Exposed: No on in Area: 0% on in Volume: 0% lization: Small (1-33%) g: No ing: No Treatment Notes Wound #83 (Right Chest) 1. Cleanse With Wound Cleanser 3. Primary Dressing Applied Calcium Alginate Ag 4. Secondary Dressing ABD Pad 5. Secured With [facility]) Signed: 07/01/2019 4:24:03 PM By: Mikeal Hawthorne EMT/HBOT Signed: 07/01/2019 6:12:41 PM By: Kela Millin Previous Signature: 06/28/2019 5:18:45 PM Version By: Carlene Coria RN Entered By: Mikeal Hawthorne on 07/01/2019 14:36:25 -------------------------------------------------------------------------------- Wound Assessment Details Patient Name: Date of Service: Huge, Finnick T. 06/28/2019 2:45 PM Medical Record [id number] Patient Account Number: [id number] Date of Birth/Sex: Treating RN: 1965-09-30 (54 y.o. Marvis Repress Primary Care Lillie Portner: Park Meo Other Clinician: Referring Teriann Livingood: Treating Tishawn Friedhoff/Extender:Robson, Edythe Clarity, Campbell Riches in Treatment: 0 Wound Status Wound Number: 84 Primary Pressure Ulcer Etiology: Wound Location: Right Back Wound Open Wounding Event: Pressure Injury Status: Date Acquired: 05/17/2019 Comorbid Anemia, Sleep Apnea, Hypertension, Weeks Of Treatment: 0 History: Osteomyelitis, Quadriplegia, Seizure Disorder Clustered Wound: Yes Photos Wound Measurements Length: (cm) 2.7 % Reduction in A Width: (cm) 7.1 % Reduction in V Depth: (cm) 0.1 Epithelializatio Clustered Quantity: 2 Tunneling: Area: (cm) 15.056 Undermining: Volume: (cm) 1.506 Wound Description Classification: Category/Stage III Foul Odor After Wound Margin: Flat and Intact Slough/Fibrino Exudate Amount: Medium Exudate Type: Purulent Exudate Color: yellow, brown, green Wound Bed Granulation Amount: Small (1-33%) Granulation Quality:  Red Fascia Exposed: Necrotic Amount: Large (67-100%) Fat Layer (Subcu Necrotic Quality: Adherent Slough Tendon Exposed: Muscle Exposed: Joint Exposed: Bone Exposed: Cleansing: No Yes Exposed Structure No taneous Tissue) Exposed: Yes No No No No rea: 0% olume: 0% n: Small (1-33%) No No Treatment Notes Wound #84 (Right Back) 1. Cleanse With Wound Cleanser 3. Primary Dressing Applied Calcium Alginate Ag 4. Secondary Dressing ABD Pad 5. Secured With [facility]) Signed:  07/01/2019 4:24:03 PM By: Mikeal Hawthorne EMT/HBOT Signed: 07/01/2019 6:12:41 PM By: Kela Millin Previous Signature: 06/28/2019 5:18:45 PM Version By: Carlene Coria RN Entered By: Mikeal Hawthorne on 07/01/2019 14:36:48 -------------------------------------------------------------------------------- Wound Assessment Details Patient Name: Date of Service: Aday, Zoey T. 06/28/2019 2:45 PM Medical Record [id number] Patient Account Number: [id number] Date of Birth/Sex: Treating RN: 1966/01/28 (54 y.o. Marvis Repress Primary Care Oswin Griffith: Park Meo Other Clinician: Referring Artavis Cowie: Treating Edgardo Petrenko/Extender:Robson, Edythe Clarity, Campbell Riches in Treatment: 0 Wound Status Wound Number: 85 Primary Pressure Ulcer Etiology: Wound Location: Right Flank Wound Open Wounding Event: Pressure Injury Status: Date Acquired: 06/17/2019 Comorbid Anemia, Sleep Apnea, Hypertension, Weeks Of Treatment: 0 History: Osteomyelitis, Quadriplegia, Seizure Disorder Clustered Wound: No Photos Wound Measurements Length: (cm) 2.9 % Reducti Width: (cm) 2.4 % Reducti Depth: (cm) 0.1 Epithelia Area: (cm) 5.466 Tunnelin Volume: (cm) 0.547 Undermin Wound Description Classification: Unstageable/Unclassified Wound Margin: Flat and Intact Exudate Amount: Small Exudate Type: Serosanguineous Exudate Color: red, brown Wound Bed Granulation Amount: None Present (0%) Necrotic Amount: Large (67-100%) Necrotic Quality: Eschar, Adherent Slough Foul Odor After Cleansing: No Slough/Fibrino No Exposed Structure Fascia Exposed: No Fat Layer (Subcutaneous Tissue) Exposed: Yes Tendon Exposed: No Muscle Exposed: No Joint Exposed: No Bone Exposed: No on in Area: 0% on in Volume: 0% lization: Small (1-33%) g: No ing: No Treatment Notes Wound #85 (Right Flank) 1. Cleanse With Wound Cleanser 3. Primary Dressing Applied Calcium Alginate Ag 4. Secondary Dressing ABD Pad 5. Secured  With [facility]) Signed: 07/01/2019 4:24:03 PM By: Mikeal Hawthorne EMT/HBOT Signed: 07/01/2019 6:12:41 PM By: Kela Millin Previous Signature: 06/28/2019 5:18:45 PM Version By: Carlene Coria RN Entered By: Mikeal Hawthorne on 07/01/2019 14:37:23 -------------------------------------------------------------------------------- Wound Assessment Details Patient Name: Date of Service: Alderman, Tyden T. 06/28/2019 2:45 PM Medical Record [id number] Patient Account Number: [id number] Date of Birth/Sex: Treating RN: [birth year] (54 y.o. Marvis Repress Primary Care Madisyn Mawhinney: Park Meo Other Clinician: Referring Ikechukwu Cerny: Treating [name] Pelaez/Extender:Robson, Edythe Clarity, Campbell Riches in Treatment: 0 Wound Status Wound Number: 86 Primary Pressure Ulcer Etiology: Wound Location: Right Flank - Inferior Wound Open Wounding Event: Pressure Injury Status: Date Acquired: 06/17/2019 Comorbid Anemia, Sleep Apnea, Hypertension, Weeks Of Treatment: 0 History: Osteomyelitis, Quadriplegia, Seizure Disorder Clustered Wound: No Photos Wound Measurements Length: (cm) 4 Width: (cm) 4 Depth: (cm) 1.2 Area: (cm) 12.566 Volume: (cm) 15.08 % Reduction in Area: 0% % Reduction in Volume: 0% Tunneling: No Undermining: Yes Starting Position (o'clock): 10 Ending Position (o'clock): 12 Maximum Distance: (cm) 2.1 Wound Description Classification: Category/Stage IV Wound Margin: Flat and Intact Exudate Amount: Medium Exudate Type: Purulent Exudate Color: yellow, brown, green Wound Bed Granulation Amount: Small (1-33%) Granulation Quality: Pink Necrotic Amount: Large (67-100%) Necrotic Quality: Adherent Slough Foul Odor After Cleansing: Yes Due to Product Use: No Slough/Fibrino Yes Exposed Structure Fascia Exposed: No Fat Layer (Subcutaneous Tissue) Exposed: Yes Tendon Exposed: No Muscle Exposed: No Joint Exposed: No Bone Exposed: Yes Treatment  Notes Wound #86 (Right, Inferior Flank) 1. Cleanse With Wound Cleanser 3. Primary Dressing Applied Calcium Alginate Ag 4. Secondary Dressing  ABD Pad 5. Secured With [facility]) Signed: 07/01/2019 4:24:03 PM By: Mikeal Hawthorne EMT/HBOT Signed: 07/01/2019 6:12:41 PM By: Kela Millin Previous Signature: 06/28/2019 5:18:45 PM Version By: Carlene Coria RN Entered By: Mikeal Hawthorne on 07/01/2019 14:37:50 -------------------------------------------------------------------------------- Vitals Details Patient Name: Date of Service: [name]. 06/28/2019 2:45 PM Medical Record [id number] Patient Account Number: [id number] Date of Birth/Sex: Treating RN: 1965/08/16 (53 y.o. Jerilynn Mages) Dolores Lory, Sharon Primary Care Scarleth Brame: Park Meo Other Clinician: Referring Laurier Jasperson: Treating Bria Sparr/Extender:Robson, Edythe Clarity, Campbell Riches in Treatment: 0 Vital Signs Time Taken: 14:45 Temperature (F): 98.1 Height (in): 72 Pulse (bpm): 112 Source: Stated Respiratory Rate (breaths/min): 18 Weight (lbs): 157 Blood Pressure (mmHg): 104/76 Source: Stated Reference Range: 80 - 120 mg / dl Body Mass Index (BMI): 21.3 Electronic Signature(s) Signed: 06/28/2019 5:18:45 PM By: Carlene Coria RN Entered By: Carlene Coria on 06/28/2019 14:48:10

## 2019-07-03 ENCOUNTER — Emergency Department (HOSPITAL_COMMUNITY): Payer: Medicare Other

## 2019-07-03 ENCOUNTER — Inpatient Hospital Stay (HOSPITAL_COMMUNITY)
Admission: EM | Admit: 2019-07-03 | Discharge: 2019-07-10 | DRG: 698 | Disposition: A | Discharge status: Home Health | Payer: Medicare Other | Source: Ambulatory Visit | Attending: Internal Medicine | Admitting: Internal Medicine

## 2019-07-03 ENCOUNTER — Other Ambulatory Visit: Payer: Self-pay

## 2019-07-03 DIAGNOSIS — Z87440 Personal history of urinary (tract) infections: Secondary | ICD-10-CM

## 2019-07-03 DIAGNOSIS — L89329 Pressure ulcer of left buttock, unspecified stage: Secondary | ICD-10-CM | POA: Diagnosis present

## 2019-07-03 DIAGNOSIS — K219 Gastro-esophageal reflux disease without esophagitis: Secondary | ICD-10-CM | POA: Diagnosis present

## 2019-07-03 DIAGNOSIS — L89890 Pressure ulcer of other site, unstageable: Secondary | ICD-10-CM | POA: Diagnosis present

## 2019-07-03 DIAGNOSIS — Z7401 Bed confinement status: Secondary | ICD-10-CM

## 2019-07-03 DIAGNOSIS — R05 Cough: Secondary | ICD-10-CM

## 2019-07-03 DIAGNOSIS — B9689 Other specified bacterial agents as the cause of diseases classified elsewhere: Secondary | ICD-10-CM | POA: Diagnosis present

## 2019-07-03 DIAGNOSIS — A419 Sepsis, unspecified organism: Secondary | ICD-10-CM | POA: Diagnosis present

## 2019-07-03 DIAGNOSIS — M4628 Osteomyelitis of vertebra, sacral and sacrococcygeal region: Secondary | ICD-10-CM | POA: Diagnosis present

## 2019-07-03 DIAGNOSIS — D638 Anemia in other chronic diseases classified elsewhere: Secondary | ICD-10-CM | POA: Diagnosis present

## 2019-07-03 DIAGNOSIS — L89112 Pressure ulcer of right upper back, stage 2: Secondary | ICD-10-CM | POA: Diagnosis present

## 2019-07-03 DIAGNOSIS — K5909 Other constipation: Secondary | ICD-10-CM | POA: Diagnosis present

## 2019-07-03 DIAGNOSIS — N390 Urinary tract infection, site not specified: Secondary | ICD-10-CM | POA: Diagnosis present

## 2019-07-03 DIAGNOSIS — R42 Dizziness and giddiness: Secondary | ICD-10-CM | POA: Diagnosis present

## 2019-07-03 DIAGNOSIS — G8253 Quadriplegia, C5-C7 complete: Secondary | ICD-10-CM | POA: Diagnosis present

## 2019-07-03 DIAGNOSIS — L89154 Pressure ulcer of sacral region, stage 4: Secondary | ICD-10-CM | POA: Diagnosis present

## 2019-07-03 DIAGNOSIS — Z79899 Other long term (current) drug therapy: Secondary | ICD-10-CM

## 2019-07-03 DIAGNOSIS — I9589 Other hypotension: Secondary | ICD-10-CM | POA: Diagnosis present

## 2019-07-03 DIAGNOSIS — Z20822 Contact with and (suspected) exposure to covid-19: Secondary | ICD-10-CM | POA: Diagnosis present

## 2019-07-03 DIAGNOSIS — I10 Essential (primary) hypertension: Secondary | ICD-10-CM | POA: Diagnosis present

## 2019-07-03 DIAGNOSIS — E861 Hypovolemia: Secondary | ICD-10-CM

## 2019-07-03 DIAGNOSIS — Y846 Urinary catheterization as the cause of abnormal reaction of the patient, or of later complication, without mention of misadventure at the time of the procedure: Secondary | ICD-10-CM | POA: Diagnosis present

## 2019-07-03 DIAGNOSIS — Z933 Colostomy status: Secondary | ICD-10-CM

## 2019-07-03 DIAGNOSIS — E876 Hypokalemia: Secondary | ICD-10-CM | POA: Diagnosis present

## 2019-07-03 DIAGNOSIS — L89616 Pressure-induced deep tissue damage of right heel: Secondary | ICD-10-CM | POA: Diagnosis present

## 2019-07-03 DIAGNOSIS — Y732 Prosthetic and other implants, materials and accessory gastroenterology and urology devices associated with adverse incidents: Secondary | ICD-10-CM | POA: Diagnosis present

## 2019-07-03 DIAGNOSIS — J90 Pleural effusion, not elsewhere classified: Secondary | ICD-10-CM | POA: Diagnosis present

## 2019-07-03 DIAGNOSIS — L89319 Pressure ulcer of right buttock, unspecified stage: Secondary | ICD-10-CM | POA: Diagnosis present

## 2019-07-03 DIAGNOSIS — Z881 Allergy status to other antibiotic agents status: Secondary | ICD-10-CM

## 2019-07-03 DIAGNOSIS — E869 Volume depletion, unspecified: Secondary | ICD-10-CM

## 2019-07-03 DIAGNOSIS — Z95828 Presence of other vascular implants and grafts: Secondary | ICD-10-CM

## 2019-07-03 DIAGNOSIS — J9610 Chronic respiratory failure, unspecified whether with hypoxia or hypercapnia: Secondary | ICD-10-CM | POA: Diagnosis present

## 2019-07-03 DIAGNOSIS — Z833 Family history of diabetes mellitus: Secondary | ICD-10-CM

## 2019-07-03 DIAGNOSIS — Z8619 Personal history of other infectious and parasitic diseases: Secondary | ICD-10-CM

## 2019-07-03 DIAGNOSIS — R059 Cough, unspecified: Secondary | ICD-10-CM

## 2019-07-03 DIAGNOSIS — J9811 Atelectasis: Secondary | ICD-10-CM | POA: Diagnosis present

## 2019-07-03 DIAGNOSIS — Z803 Family history of malignant neoplasm of breast: Secondary | ICD-10-CM

## 2019-07-03 DIAGNOSIS — T83511A Infection and inflammatory reaction due to indwelling urethral catheter, initial encounter: Secondary | ICD-10-CM | POA: Diagnosis not present

## 2019-07-03 DIAGNOSIS — Z981 Arthrodesis status: Secondary | ICD-10-CM

## 2019-07-03 DIAGNOSIS — Z8711 Personal history of peptic ulcer disease: Secondary | ICD-10-CM

## 2019-07-03 DIAGNOSIS — Z888 Allergy status to other drugs, medicaments and biological substances status: Secondary | ICD-10-CM

## 2019-07-03 DIAGNOSIS — Z8719 Personal history of other diseases of the digestive system: Secondary | ICD-10-CM

## 2019-07-03 LAB — COMPREHENSIVE METABOLIC PANEL
ALT: 13 U/L (ref 0–44)
AST: 13 U/L — ABNORMAL LOW (ref 15–41)
Albumin: 1.3 g/dL — ABNORMAL LOW (ref 3.5–5.0)
Alkaline Phosphatase: 92 U/L (ref 38–126)
Anion gap: 6 (ref 5–15)
BUN: 17 mg/dL (ref 6–20)
CO2: 25 mmol/L (ref 22–32)
Calcium: 7.8 mg/dL — ABNORMAL LOW (ref 8.9–10.3)
Chloride: 112 mmol/L — ABNORMAL HIGH (ref 98–111)
Creatinine, Ser: 0.43 mg/dL — ABNORMAL LOW (ref 0.61–1.24)
GFR calc Af Amer: >60 mL/min (ref >60)
GFR calc non Af Amer: >60 mL/min (ref >60)
Glucose, Bld: 108 mg/dL — ABNORMAL HIGH (ref 70–99)
Potassium: 3 mmol/L — ABNORMAL LOW (ref 3.5–5.1)
Sodium: 143 mmol/L (ref 135–145)
Total Bilirubin: 0.5 mg/dL (ref 0.3–1.2)
Total Protein: 5.1 g/dL — ABNORMAL LOW (ref 6.5–8.1)

## 2019-07-03 LAB — CBC WITH DIFFERENTIAL/PLATELET
Abs Immature Granulocytes: 0.02 10*3/uL (ref 0.00–0.07)
Basophils Absolute: 0 10*3/uL (ref 0.0–0.1)
Basophils Relative: 0 %
Eosinophils Absolute: 0 10*3/uL (ref 0.0–0.5)
Eosinophils Relative: 1 %
HCT: 28.3 % — ABNORMAL LOW (ref 39.0–52.0)
Hemoglobin: 8.1 g/dL — ABNORMAL LOW (ref 13.0–17.0)
Immature Granulocytes: 0 %
Lymphocytes Relative: 30 %
Lymphs Abs: 2 10*3/uL (ref 0.7–4.0)
MCH: 23.6 pg — ABNORMAL LOW (ref 26.0–34.0)
MCHC: 28.6 g/dL — ABNORMAL LOW (ref 30.0–36.0)
MCV: 82.5 fL (ref 80.0–100.0)
Monocytes Absolute: 0.9 10*3/uL (ref 0.1–1.0)
Monocytes Relative: 14 %
Neutro Abs: 3.5 10*3/uL (ref 1.7–7.7)
Neutrophils Relative %: 55 %
Platelets: 389 10*3/uL (ref 150–400)
RBC: 3.43 MIL/uL — ABNORMAL LOW (ref 4.22–5.81)
RDW: 17.4 % — ABNORMAL HIGH (ref 11.5–15.5)
WBC: 6.4 10*3/uL (ref 4.0–10.5)
nRBC: 0 % (ref 0.0–0.2)

## 2019-07-03 LAB — URINALYSIS, ROUTINE W REFLEX MICROSCOPIC
Bilirubin Urine: NEGATIVE
Glucose, UA: NEGATIVE mg/dL
Ketones, ur: NEGATIVE mg/dL
Nitrite: NEGATIVE
Protein, ur: 100 mg/dL — AB
Specific Gravity, Urine: 1.017 (ref 1.005–1.030)
WBC, UA: >50 WBC/hpf — ABNORMAL HIGH (ref 0–5)
pH: 7 (ref 5.0–8.0)

## 2019-07-03 LAB — LACTIC ACID, PLASMA: Lactic Acid, Venous: 1.3 mmol/L (ref 0.5–1.9)

## 2019-07-03 LAB — PROTIME-INR
INR: 1.2 (ref 0.8–1.2)
Prothrombin Time: 15.3 seconds — ABNORMAL HIGH (ref 11.4–15.2)

## 2019-07-03 LAB — APTT: aPTT: 34 seconds (ref 24–36)

## 2019-07-03 MED ORDER — LINEZOLID 600 MG/300ML IV SOLN
600.0000 mg | Freq: Two times a day (BID) | INTRAVENOUS | Status: AC
  Administered 2019-07-03: 600 mg via INTRAVENOUS
  Filled 2019-07-03: qty 300

## 2019-07-03 MED ORDER — POTASSIUM CHLORIDE CRYS ER 20 MEQ PO TBCR
20.0000 meq | EXTENDED_RELEASE_TABLET | Freq: Once | ORAL | Status: AC
  Administered 2019-07-03: 20 meq via ORAL
  Filled 2019-07-03: qty 1

## 2019-07-03 MED ORDER — POTASSIUM CHLORIDE 10 MEQ/100ML IV SOLN
10.0000 meq | INTRAVENOUS | Status: AC
  Administered 2019-07-03 – 2019-07-04 (×2): 10 meq via INTRAVENOUS
  Filled 2019-07-03 (×2): qty 100

## 2019-07-03 MED ORDER — LACTATED RINGERS IV BOLUS (SEPSIS)
1000.0000 mL | Freq: Once | INTRAVENOUS | Status: AC
  Administered 2019-07-03: 22:00:00 1000 mL via INTRAVENOUS

## 2019-07-03 MED ORDER — SODIUM CHLORIDE 0.9 % IV SOLN
2.0000 g | Freq: Three times a day (TID) | INTRAVENOUS | Status: DC
  Administered 2019-07-04 – 2019-07-07 (×11): 2 g via INTRAVENOUS
  Filled 2019-07-03 (×15): qty 2

## 2019-07-03 MED ORDER — SODIUM CHLORIDE 0.9% FLUSH: 10.0000 mL | INTRAVENOUS | Status: DC | PRN

## 2019-07-03 MED ORDER — SODIUM CHLORIDE 0.9 % IV SOLN
2.0000 g | Freq: Once | INTRAVENOUS | Status: AC
  Administered 2019-07-03: 2 g via INTRAVENOUS
  Filled 2019-07-03: qty 2

## 2019-07-03 MED ORDER — SODIUM CHLORIDE 0.9% FLUSH
10.0000 mL | Freq: Two times a day (BID) | INTRAVENOUS | Status: DC
  Administered 2019-07-04 – 2019-07-07 (×6): 10 mL

## 2019-07-03 MED ORDER — LACTATED RINGERS IV BOLUS (SEPSIS)
250.0000 mL | Freq: Once | INTRAVENOUS | Status: AC
  Administered 2019-07-03: 22:00:00 250 mL via INTRAVENOUS

## 2019-07-03 MED ORDER — LACTATED RINGERS IV BOLUS (SEPSIS)
1000.0000 mL | Freq: Once | INTRAVENOUS | Status: AC
  Administered 2019-07-03: 1000 mL via INTRAVENOUS
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
## 2019-07-03 NOTE — ED Notes (Signed)
Assisted IV attempt x2 with Dr Sabra Heck with no success. IV team consult added

## 2019-07-03 NOTE — ED Triage Notes (Signed)
Patient presents to ED with complaints of dizziness and low blood pressure

## 2019-07-03 NOTE — Progress Notes (Signed)
Pharmacy Antibiotic Note  [name] [name] is a 54 y.o. male admitted on 07/03/2019 with sepsis/ UTI.  Pharmacy has been consulted for cefepime dosing. He is a known quadriplegic.   WBC WNL. Tmax 99.3. Patient was hypotensive with a BP 68/50. Scr 0.43.   Plan: - Start cefepime 2g IV q8hr - Monitor renal function, UOP, c/s for deescalation  Height: 6' (182.9 cm) Weight: 157 lb 3 oz (71.3 kg) IBW/kg (Calculated) : 77.6  Temp (24hrs), Avg:99.3 F (37.4 C), Min:99.3 F (37.4 C), Max:99.3 F (37.4 C)  No results for input(s): WBC, CREATININE, LATICACIDVEN, VANCOTROUGH, VANCOPEAK, VANCORANDOM, GENTTROUGH, GENTPEAK, GENTRANDOM, TOBRATROUGH, TOBRAPEAK, TOBRARND, AMIKACINPEAK, AMIKACINTROU, AMIKACIN in the last 168 hours.  CrCl cannot be calculated (This lab value cannot be used to calculate CrCl because it is not a number: <0.30).    Allergies  Allergen Reactions  . Ferumoxytol Anaphylaxis and Other (See Comments)    (Feraheme) = "SYNCOPE; patient tolerated Venofer 10/01/18 s rxn"   . Oxybutynin Other (See Comments)    Hallucinations    . Vancomycin Other (See Comments)    ARF 05-2016 -- affects kidneys     Antimicrobials this admission: Cefepime 2/17 >>   Dose adjustments this admission:  Microbiology results: 2/17 BCx: sent 2/17 UCx: sent   Thank you for allowing pharmacy to be a part of this patient's care.  Agnes Lawrence, PharmD PGY1 Pharmacy Resident

## 2019-07-03 NOTE — ED Notes (Signed)
Multiple attempt for 2nd set of cultures with no success. Will forego second set and start antibiotics

## 2019-07-04 ENCOUNTER — Encounter (HOSPITAL_COMMUNITY): Payer: Self-pay | Admitting: Family Medicine

## 2019-07-04 LAB — CBC WITH DIFFERENTIAL/PLATELET
Abs Immature Granulocytes: 0.03 10*3/uL (ref 0.00–0.07)
Basophils Absolute: 0 10*3/uL (ref 0.0–0.1)
Basophils Relative: 0 %
Eosinophils Absolute: 0.1 10*3/uL (ref 0.0–0.5)
Eosinophils Relative: 1 %
HCT: 24.7 % — ABNORMAL LOW (ref 39.0–52.0)
Hemoglobin: 7.2 g/dL — ABNORMAL LOW (ref 13.0–17.0)
Immature Granulocytes: 1 %
Lymphocytes Relative: 37 %
Lymphs Abs: 2.4 10*3/uL (ref 0.7–4.0)
MCH: 24.2 pg — ABNORMAL LOW (ref 26.0–34.0)
MCHC: 29.1 g/dL — ABNORMAL LOW (ref 30.0–36.0)
MCV: 82.9 fL (ref 80.0–100.0)
Monocytes Absolute: 0.9 10*3/uL (ref 0.1–1.0)
Monocytes Relative: 14 %
Neutro Abs: 3 10*3/uL (ref 1.7–7.7)
Neutrophils Relative %: 47 %
Platelets: 325 10*3/uL (ref 150–400)
RBC: 2.98 MIL/uL — ABNORMAL LOW (ref 4.22–5.81)
RDW: 17.4 % — ABNORMAL HIGH (ref 11.5–15.5)
WBC: 6.5 10*3/uL (ref 4.0–10.5)
nRBC: 0 % (ref 0.0–0.2)

## 2019-07-04 LAB — MAGNESIUM: Magnesium: 1 mg/dL — ABNORMAL LOW (ref 1.7–2.4)

## 2019-07-04 LAB — URINE CULTURE

## 2019-07-04 LAB — COMPREHENSIVE METABOLIC PANEL
ALT: 13 U/L (ref 0–44)
AST: 12 U/L — ABNORMAL LOW (ref 15–41)
Albumin: <1 g/dL — ABNORMAL LOW (ref 3.5–5.0)
Alkaline Phosphatase: 82 U/L (ref 38–126)
Anion gap: 5 (ref 5–15)
BUN: 15 mg/dL (ref 6–20)
CO2: 22 mmol/L (ref 22–32)
Calcium: 7.1 mg/dL — ABNORMAL LOW (ref 8.9–10.3)
Chloride: 117 mmol/L — ABNORMAL HIGH (ref 98–111)
Creatinine, Ser: 0.39 mg/dL — ABNORMAL LOW (ref 0.61–1.24)
GFR calc Af Amer: >60 mL/min (ref >60)
GFR calc non Af Amer: >60 mL/min (ref >60)
Glucose, Bld: 110 mg/dL — ABNORMAL HIGH (ref 70–99)
Potassium: 2.6 mmol/L — CL (ref 3.5–5.1)
Sodium: 144 mmol/L (ref 135–145)
Total Bilirubin: 0.4 mg/dL (ref 0.3–1.2)
Total Protein: 4.2 g/dL — ABNORMAL LOW (ref 6.5–8.1)

## 2019-07-04 LAB — SARS CORONAVIRUS 2 (TAT 6-24 HRS): SARS Coronavirus 2: NEGATIVE

## 2019-07-04 MED ORDER — ONDANSETRON HCL 4 MG PO TABS: 4.0000 mg | ORAL_TABLET | Freq: Four times a day (QID) | ORAL | Status: DC | PRN

## 2019-07-04 MED ORDER — MIDODRINE HCL 5 MG PO TABS
10.0000 mg | ORAL_TABLET | Freq: Three times a day (TID) | ORAL | Status: DC
  Administered 2019-07-04 – 2019-07-10 (×18): 10 mg via ORAL
  Filled 2019-07-04 (×24): qty 2

## 2019-07-04 MED ORDER — ONDANSETRON HCL 4 MG/2ML IJ SOLN: 4.0000 mg | Freq: Four times a day (QID) | INTRAMUSCULAR | Status: DC | PRN

## 2019-07-04 MED ORDER — POTASSIUM CHLORIDE CRYS ER 20 MEQ PO TBCR
40.0000 meq | EXTENDED_RELEASE_TABLET | Freq: Two times a day (BID) | ORAL | Status: AC
  Administered 2019-07-04 (×2): 40 meq via ORAL
  Filled 2019-07-04 (×2): qty 2

## 2019-07-04 MED ORDER — FESOTERODINE FUMARATE ER 8 MG PO TB24
8.0000 mg | ORAL_TABLET | Freq: Every day | ORAL | Status: DC
  Administered 2019-07-05 – 2019-07-10 (×4): 8 mg via ORAL
  Filled 2019-07-04 (×6): qty 1

## 2019-07-04 MED ORDER — ALBUTEROL SULFATE (2.5 MG/3ML) 0.083% IN NEBU: 3.0000 mL | INHALATION_SOLUTION | Freq: Four times a day (QID) | RESPIRATORY_TRACT | Status: DC | PRN

## 2019-07-04 MED ORDER — HYDROCODONE-ACETAMINOPHEN 5-325 MG PO TABS
1.0000 | ORAL_TABLET | ORAL | Status: DC | PRN
  Administered 2019-07-10: 1 via ORAL
  Filled 2019-07-04: qty 1

## 2019-07-04 MED ORDER — SODIUM CHLORIDE 0.9 % IV BOLUS
250.0000 mL | Freq: Once | INTRAVENOUS | Status: AC
  Administered 2019-07-04: 05:00:00 250 mL via INTRAVENOUS

## 2019-07-04 MED ORDER — JUVEN PO PACK
1.0000 | PACK | Freq: Two times a day (BID) | ORAL | Status: DC
  Filled 2019-07-04 (×3): qty 1

## 2019-07-04 MED ORDER — POLYETHYLENE GLYCOL 3350 17 G PO PACK: 17.0000 g | PACK | Freq: Every day | ORAL | Status: DC | PRN

## 2019-07-04 MED ORDER — MAGNESIUM SULFATE 4 GM/100ML IV SOLN
4.0000 g | Freq: Once | INTRAVENOUS | Status: AC
  Administered 2019-07-04: 11:00:00 4 g via INTRAVENOUS
  Filled 2019-07-04: qty 100

## 2019-07-04 MED ORDER — BACLOFEN 20 MG PO TABS
20.0000 mg | ORAL_TABLET | Freq: Two times a day (BID) | ORAL | Status: DC
  Administered 2019-07-04 – 2019-07-10 (×8): 20 mg via ORAL
  Filled 2019-07-04 (×5): qty 2
  Filled 2019-07-04 (×2): qty 1
  Filled 2019-07-04 (×2): qty 2
  Filled 2019-07-04 (×2): qty 1
  Filled 2019-07-04: qty 2
  Filled 2019-07-04 (×2): qty 1
  Filled 2019-07-04 (×2): qty 2
  Filled 2019-07-04 (×5): qty 1
  Filled 2019-07-04: qty 2
  Filled 2019-07-04 (×2): qty 1

## 2019-07-04 MED ORDER — SUCRALFATE 1 G PO TABS
1.0000 g | ORAL_TABLET | Freq: Four times a day (QID) | ORAL | Status: DC
  Administered 2019-07-04 – 2019-07-10 (×17): 1 g via ORAL
  Filled 2019-07-04 (×20): qty 1

## 2019-07-04 MED ORDER — SODIUM CHLORIDE 0.9% FLUSH
3.0000 mL | Freq: Two times a day (BID) | INTRAVENOUS | Status: DC
  Administered 2019-07-09 – 2019-07-10 (×2): 3 mL via INTRAVENOUS

## 2019-07-04 MED ORDER — ENSURE MAX PROTEIN PO LIQD
11.0000 [oz_av] | Freq: Every day | ORAL | Status: DC
  Administered 2019-07-05: 11 [oz_av] via ORAL
  Filled 2019-07-04 (×6): qty 330

## 2019-07-04 MED ORDER — ENOXAPARIN SODIUM 40 MG/0.4ML ~~LOC~~ SOLN
40.0000 mg | SUBCUTANEOUS | Status: DC
  Administered 2019-07-04 – 2019-07-09 (×4): 40 mg via SUBCUTANEOUS
  Filled 2019-07-04 (×5): qty 0.4

## 2019-07-04 MED ORDER — ADULT MULTIVITAMIN W/MINERALS CH
1.0000 | ORAL_TABLET | Freq: Every day | ORAL | Status: DC
  Administered 2019-07-04 – 2019-07-10 (×5): 1 via ORAL
  Filled 2019-07-04 (×7): qty 1

## 2019-07-04 MED ORDER — SODIUM CHLORIDE 0.9% FLUSH: 3.0000 mL | INTRAVENOUS | Status: DC | PRN

## 2019-07-04 MED ORDER — MAGNESIUM OXIDE 400 (241.3 MG) MG PO TABS
400.0000 mg | ORAL_TABLET | Freq: Two times a day (BID) | ORAL | Status: DC
  Administered 2019-07-04 – 2019-07-10 (×9): 400 mg via ORAL
  Filled 2019-07-04 (×12): qty 1

## 2019-07-04 MED ORDER — SODIUM CHLORIDE 0.9% FLUSH: 3.0000 mL | Freq: Two times a day (BID) | INTRAVENOUS | Status: DC

## 2019-07-04 MED ORDER — SODIUM CHLORIDE 0.9 % IV SOLN: 250.0000 mL | INTRAVENOUS | Status: DC | PRN

## 2019-07-04 MED ORDER — GUAIFENESIN 100 MG/5ML PO SOLN
5.0000 mL | ORAL | Status: DC | PRN
  Administered 2019-07-06: 22:00:00 100 mg via ORAL
  Filled 2019-07-04: qty 5

## 2019-07-04 MED ORDER — ACETAMINOPHEN 650 MG RE SUPP: 650.0000 mg | Freq: Four times a day (QID) | RECTAL | Status: DC | PRN

## 2019-07-04 MED ORDER — PANTOPRAZOLE SODIUM 40 MG PO TBEC
40.0000 mg | DELAYED_RELEASE_TABLET | Freq: Two times a day (BID) | ORAL | Status: DC
  Administered 2019-07-04 – 2019-07-10 (×10): 40 mg via ORAL
  Filled 2019-07-04 (×11): qty 1

## 2019-07-04 MED ORDER — POTASSIUM CHLORIDE 10 MEQ/100ML IV SOLN
10.0000 meq | INTRAVENOUS | Status: AC
  Administered 2019-07-04 (×2): 10 meq via INTRAVENOUS
  Filled 2019-07-04 (×2): qty 100

## 2019-07-04 MED ORDER — ZINC SULFATE 220 (50 ZN) MG PO CAPS
220.0000 mg | ORAL_CAPSULE | Freq: Two times a day (BID) | ORAL | Status: DC
  Administered 2019-07-04 – 2019-07-10 (×9): 220 mg via ORAL
  Filled 2019-07-04 (×12): qty 1

## 2019-07-04 MED ORDER — ACETAMINOPHEN 325 MG PO TABS
650.0000 mg | ORAL_TABLET | Freq: Four times a day (QID) | ORAL | Status: DC | PRN
  Administered 2019-07-10: 650 mg via ORAL
  Filled 2019-07-04: qty 2

## 2019-07-04 MED ORDER — ASCORBIC ACID 500 MG PO TABS
500.0000 mg | ORAL_TABLET | Freq: Every day | ORAL | Status: DC
  Administered 2019-07-04 – 2019-07-10 (×5): 500 mg via ORAL
  Filled 2019-07-04 (×7): qty 1

## 2019-07-04 MED ORDER — SODIUM CHLORIDE 0.9 % IV SOLN: INTRAVENOUS | Status: DC

## 2019-07-04 NOTE — ED Notes (Signed)
Lunch Tray Ordered @ 1049. °
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
## 2019-07-04 NOTE — ED Notes (Signed)
SDU  Breakfast ordered  

## 2019-07-04 NOTE — ED Notes (Signed)
Wound care paged to assess status of consult.

## 2019-07-04 NOTE — Plan of Care (Signed)

## 2019-07-05 ENCOUNTER — Inpatient Hospital Stay (HOSPITAL_COMMUNITY): Payer: Medicare Other

## 2019-07-05 LAB — COMPREHENSIVE METABOLIC PANEL
ALT: 12 U/L (ref 0–44)
AST: 13 U/L — ABNORMAL LOW (ref 15–41)
Albumin: 1.1 g/dL — ABNORMAL LOW (ref 3.5–5.0)
Alkaline Phosphatase: 98 U/L (ref 38–126)
Anion gap: 6 (ref 5–15)
BUN: 12 mg/dL (ref 6–20)
CO2: 23 mmol/L (ref 22–32)
Calcium: 7.6 mg/dL — ABNORMAL LOW (ref 8.9–10.3)
Chloride: 111 mmol/L (ref 98–111)
Creatinine, Ser: <0.3 mg/dL — ABNORMAL LOW (ref 0.61–1.24)
Glucose, Bld: 105 mg/dL — ABNORMAL HIGH (ref 70–99)
Potassium: 3.5 mmol/L (ref 3.5–5.1)
Sodium: 140 mmol/L (ref 135–145)
Total Bilirubin: <0.1 mg/dL — ABNORMAL LOW (ref 0.3–1.2)
Total Protein: 4.9 g/dL — ABNORMAL LOW (ref 6.5–8.1)

## 2019-07-05 LAB — CBC WITH DIFFERENTIAL/PLATELET
Abs Immature Granulocytes: 0.03 10*3/uL (ref 0.00–0.07)
Basophils Absolute: 0 10*3/uL (ref 0.0–0.1)
Basophils Relative: 0 %
Eosinophils Absolute: 0.2 10*3/uL (ref 0.0–0.5)
Eosinophils Relative: 3 %
HCT: 25.8 % — ABNORMAL LOW (ref 39.0–52.0)
Hemoglobin: 7.7 g/dL — ABNORMAL LOW (ref 13.0–17.0)
Immature Granulocytes: 0 %
Lymphocytes Relative: 44 %
Lymphs Abs: 3.1 10*3/uL (ref 0.7–4.0)
MCH: 24.1 pg — ABNORMAL LOW (ref 26.0–34.0)
MCHC: 29.8 g/dL — ABNORMAL LOW (ref 30.0–36.0)
MCV: 80.6 fL (ref 80.0–100.0)
Monocytes Absolute: 0.9 10*3/uL (ref 0.1–1.0)
Monocytes Relative: 13 %
Neutro Abs: 2.9 10*3/uL (ref 1.7–7.7)
Neutrophils Relative %: 40 %
Platelets: 361 10*3/uL (ref 150–400)
RBC: 3.2 MIL/uL — ABNORMAL LOW (ref 4.22–5.81)
RDW: 17.3 % — ABNORMAL HIGH (ref 11.5–15.5)
WBC: 7.2 10*3/uL (ref 4.0–10.5)
nRBC: 0 % (ref 0.0–0.2)

## 2019-07-05 LAB — MAGNESIUM: Magnesium: 1.4 mg/dL — ABNORMAL LOW (ref 1.7–2.4)

## 2019-07-05 MED ORDER — COLLAGENASE 250 UNIT/GM EX OINT
TOPICAL_OINTMENT | Freq: Every day | CUTANEOUS | Status: DC
  Filled 2019-07-05 (×4): qty 30

## 2019-07-05 MED ORDER — PRO-STAT SUGAR FREE PO LIQD
30.0000 mL | Freq: Two times a day (BID) | ORAL | Status: DC
  Administered 2019-07-08: 30 mL via ORAL
  Filled 2019-07-05 (×5): qty 30

## 2019-07-05 NOTE — Consult Note (Signed)
   [facility] CM Inpatient Consult   07/05/2019  AKASHDEEP TRIMNAL 1965-12-08 [id number]   Patient screened for extreme high risk score for unplanned readmission score and less than 30 days for re- hospializations to check if potential [place] [facility] services are needed.  Primary Care Provider [PCP] is  Maxwell Caul, Leonides Sake, with [facility] at [facility], this is not a Curator.  Plan: Sign off, not in network with [employer] with PCP.  For questions contact:   Natividad Brood, RN BSN [facility] Liaison  [phone] business mobile phone Toll free office [phone]  Fax number: [phone] [email or web address]

## 2019-07-05 NOTE — Progress Notes (Signed)
RT stuck pt for lab work because lab was unsuccessful.  Blood work given to [employer] and she walked it to lab.

## 2019-07-05 NOTE — Progress Notes (Signed)
Pharmacy Antibiotic Note  [name] [name] is a 54 y.o. male admitted on 07/03/2019 with sepsis/ UTI.  Pharmacy has been consulted for cefepime dosing. He is a known quadriplegic and hx of MDR organisms -SCr= 0.39, WBC= 6.5, afeb -urine cultures- multiple organisms  Plan: - Continue cefepime 2g IV q8hr - Monitor renal function and cultures for deescalation  Height: 6' (182.9 cm) Weight: 157 lb 3 oz (71.3 kg) IBW/kg (Calculated) : 77.6  Temp (24hrs), Avg:99.2 F (37.3 C), Min:98.4 F (36.9 C), Max:100 F (37.8 C)  Recent Labs  Lab 07/03/19 2001 07/04/19 0500  WBC 6.4 6.5  CREATININE 0.43* 0.39*  LATICACIDVEN 1.3  --     Estimated Creatinine Clearance: 108.9 mL/min (A) (by C-G formula based on SCr of 0.39 mg/dL (L)).    Allergies  Allergen Reactions  . Ferumoxytol Anaphylaxis and Other (See Comments)    (Feraheme) = "SYNCOPE; patient tolerated Venofer 10/01/18 s rxn"   . Oxybutynin Other (See Comments)    Hallucinations    . Vancomycin Other (See Comments)    ARF 05-2016 -- affects kidneys     Antimicrobials this admission: Cefepime 2/17 >>   Dose adjustments this admission:  Microbiology results: 2/17 BCx: ngtd 2/17 UCx: multiple organisms  Thank you for allowing pharmacy to be a part of this patient's care.  Hildred Laser, PharmD Clinical Pharmacist **Pharmacist phone directory can now be found on [email or web address] ([email or web address]).  Listed under [facility].
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
## 2019-07-05 NOTE — Consult Note (Signed)
[facility] Nurse Consult Note: Reason for Consult:Patient known to [facility] team from previous admissions.  Seen today for skin and wound assessment, wound measurements and provision of ostomy supplies. New wound noted to left plantar foot at great metatarsal head.  Wound type:Pressure Pressure Injury POA: Yes Measurement: Left trochanter:  5cm x 4cm x  100% slough moist Right flank: 3cm x 3cm Unstageable (100% nonviable)  Right lateral chest: 9cm x 8cm x 0.4cm with undermining at 6 o'clock measuring 0.5cm Right thoracic area 2.5cm round 100% red Right flank: 1.5cm x 4cm  With 50% nonviable yellow tissue and 50% red, moist tissue Sacrum/buttocks/ischial tuberosities: 26cm x 30cm x 0.4cm  75% red, 25% nonviable Right medial heel DTPI: 4cm x 2cm Left foot, 1st met head: 1cm x 3.5cm Unstageable (yellow nonviable tissue , 100%) Wound bed:As noted above Drainage (amount, consistency, odor) Large amounts of serous to light yellow exudate consistent with autolytically debriding nonviable tissue. Periwound: dry, intact with evidence of previous wound healing. Dressing procedure/placement/frequency:Air mattress provided as well as bilateral Prevalon boots.  Cleanse wounds to left foot and left trochanter, right back (All wounds with slough to wound bed) with NS and pat dry.  Apply Santyl to wound bed, cover with NS moist gauze and dry gauze.  Secure with ABD pad and tape.  Change daily.    Topical care orders for all other (clean wounds)  daily care with silver hydrofiber dressings (Aquacel Advantage) topped with ABDs and secured with Medipore tape are provided. Silicone foam dressings to the right medial heel and left foot, 1st metatarsal head are to be applied. Turning and repositioning is in place, patient prefers positioning in the supine and left sided positions.   [facility] Nurse ostomy consult note Stoma type/location: LLQ Colosotmy Stomal assessment/size: Notseen today except through pouch. Last measurement  was 1 and 5/8 inches.  Peristomal assessment: Not seen today. Pouch intact. Treatment options for stomal/peristomal skin: Skin barrier ring Output: soft brown stool  Ostomy pouching: 2pc. 2 and 3/4 inch ostomy pouching system with skin barrier ring. Pouch is [employer], Skin barrier is Kellie Simmering # 2 and ostomy barrier ring is Kellie Simmering # [phone]  Education provided: None. Patient does not perform self care Enrolled patient in [name] program: No. Patient is established with a provider. Will not follow at this time.  Please re-consult if needed.  Domenic Moras MSN, RN, FNP-BC CWON Wound, Ostomy, Continence Nurse Pager [phone]
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
## 2019-07-06 LAB — CBC WITH DIFFERENTIAL/PLATELET
Abs Immature Granulocytes: 0.04 10*3/uL (ref 0.00–0.07)
Basophils Absolute: 0 10*3/uL (ref 0.0–0.1)
Basophils Relative: 0 %
Eosinophils Absolute: 0.2 10*3/uL (ref 0.0–0.5)
Eosinophils Relative: 3 %
HCT: 24.4 % — ABNORMAL LOW (ref 39.0–52.0)
Hemoglobin: 7.2 g/dL — ABNORMAL LOW (ref 13.0–17.0)
Immature Granulocytes: 1 %
Lymphocytes Relative: 45 %
Lymphs Abs: 3.1 10*3/uL (ref 0.7–4.0)
MCH: 23.8 pg — ABNORMAL LOW (ref 26.0–34.0)
MCHC: 29.5 g/dL — ABNORMAL LOW (ref 30.0–36.0)
MCV: 80.5 fL (ref 80.0–100.0)
Monocytes Absolute: 0.7 10*3/uL (ref 0.1–1.0)
Monocytes Relative: 11 %
Neutro Abs: 2.7 10*3/uL (ref 1.7–7.7)
Neutrophils Relative %: 40 %
Platelets: 302 10*3/uL (ref 150–400)
RBC: 3.03 MIL/uL — ABNORMAL LOW (ref 4.22–5.81)
RDW: 17.4 % — ABNORMAL HIGH (ref 11.5–15.5)
WBC: 6.8 10*3/uL (ref 4.0–10.5)
nRBC: 0 % (ref 0.0–0.2)

## 2019-07-06 LAB — BASIC METABOLIC PANEL
Anion gap: 8 (ref 5–15)
BUN: 12 mg/dL (ref 6–20)
CO2: 20 mmol/L — ABNORMAL LOW (ref 22–32)
Calcium: 7.5 mg/dL — ABNORMAL LOW (ref 8.9–10.3)
Chloride: 110 mmol/L (ref 98–111)
Creatinine, Ser: 0.42 mg/dL — ABNORMAL LOW (ref 0.61–1.24)
GFR calc Af Amer: >60 mL/min (ref >60)
GFR calc non Af Amer: >60 mL/min (ref >60)
Glucose, Bld: 105 mg/dL — ABNORMAL HIGH (ref 70–99)
Potassium: 3.7 mmol/L (ref 3.5–5.1)
Sodium: 138 mmol/L (ref 135–145)

## 2019-07-06 LAB — URINE CULTURE

## 2019-07-06 MED ORDER — SENNA 8.6 MG PO TABS
2.0000 | ORAL_TABLET | Freq: Every day | ORAL | Status: DC
  Administered 2019-07-08 – 2019-07-10 (×3): 17.2 mg via ORAL
  Filled 2019-07-06 (×6): qty 2

## 2019-07-06 MED ORDER — PROMETHAZINE HCL 25 MG/ML IJ SOLN
25.0000 mg | Freq: Four times a day (QID) | INTRAMUSCULAR | Status: DC | PRN
  Administered 2019-07-06 – 2019-07-10 (×5): 25 mg via INTRAVENOUS
  Filled 2019-07-06 (×5): qty 1

## 2019-07-06 MED ORDER — POLYETHYLENE GLYCOL 3350 17 G PO PACK
17.0000 g | PACK | Freq: Every day | ORAL | Status: DC
  Filled 2019-07-06 (×3): qty 1

## 2019-07-06 MED ORDER — BISACODYL 5 MG PO TBEC
10.0000 mg | DELAYED_RELEASE_TABLET | Freq: Every day | ORAL | Status: DC
  Administered 2019-07-09: 13:00:00 10 mg via ORAL
  Filled 2019-07-06 (×6): qty 2

## 2019-07-06 NOTE — Progress Notes (Addendum)
Paged provider.  Pt req phenergan for nausea. Offered PRN Zofran pt states makes him feel sicker. Ginger ale given. RN awaiting ordrers. [phone] paged returned Dr. Rodena Piety advise would place order for phenergan. Awaiting orders. [place] called to pt bedside. Patient asked if MD had placed order. Advise pt did speak with MD however order had not been placed. RN will reach back out to MD for orders.  1440 RN has made several attempts to administer morning meds to patient. Pt states " needs to wait."  RN will continue to monitor.  RN attempted throughout the day to administer patient medications. Pt  states nauseated could not drink anything or swallow pills at this time. Phenergan given at the pt request. RN attempted several times to administer medications pt refused. Pt appetite very poor today. Pt refused repositioning. Patient was withdrawn today seemed sad. Dressings changed per orders. Patient VSS throughout the day.
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
## 2019-07-07 LAB — BASIC METABOLIC PANEL
Anion gap: 10 (ref 5–15)
BUN: 13 mg/dL (ref 6–20)
CO2: 20 mmol/L — ABNORMAL LOW (ref 22–32)
Calcium: 7.6 mg/dL — ABNORMAL LOW (ref 8.9–10.3)
Chloride: 110 mmol/L (ref 98–111)
Creatinine, Ser: 0.38 mg/dL — ABNORMAL LOW (ref 0.61–1.24)
GFR calc Af Amer: >60 mL/min (ref >60)
GFR calc non Af Amer: >60 mL/min (ref >60)
Glucose, Bld: 89 mg/dL (ref 70–99)
Potassium: 3.1 mmol/L — ABNORMAL LOW (ref 3.5–5.1)
Sodium: 140 mmol/L (ref 135–145)

## 2019-07-07 LAB — CBC WITH DIFFERENTIAL/PLATELET
Abs Immature Granulocytes: 0.08 10*3/uL — ABNORMAL HIGH (ref 0.00–0.07)
Basophils Absolute: 0 10*3/uL (ref 0.0–0.1)
Basophils Relative: 0 %
Eosinophils Absolute: 0.1 10*3/uL (ref 0.0–0.5)
Eosinophils Relative: 1 %
HCT: 26.9 % — ABNORMAL LOW (ref 39.0–52.0)
Hemoglobin: 7.9 g/dL — ABNORMAL LOW (ref 13.0–17.0)
Immature Granulocytes: 1 %
Lymphocytes Relative: 21 %
Lymphs Abs: 2.6 10*3/uL (ref 0.7–4.0)
MCH: 23.5 pg — ABNORMAL LOW (ref 26.0–34.0)
MCHC: 29.4 g/dL — ABNORMAL LOW (ref 30.0–36.0)
MCV: 80.1 fL (ref 80.0–100.0)
Monocytes Absolute: 1.2 10*3/uL — ABNORMAL HIGH (ref 0.1–1.0)
Monocytes Relative: 10 %
Neutro Abs: 8.1 10*3/uL — ABNORMAL HIGH (ref 1.7–7.7)
Neutrophils Relative %: 67 %
Platelets: 349 10*3/uL (ref 150–400)
RBC: 3.36 MIL/uL — ABNORMAL LOW (ref 4.22–5.81)
RDW: 17.3 % — ABNORMAL HIGH (ref 11.5–15.5)
WBC: 12 10*3/uL — ABNORMAL HIGH (ref 4.0–10.5)
nRBC: 0 % (ref 0.0–0.2)

## 2019-07-07 LAB — MAGNESIUM: Magnesium: 1.3 mg/dL — ABNORMAL LOW (ref 1.7–2.4)

## 2019-07-07 MED ORDER — POTASSIUM CHLORIDE CRYS ER 20 MEQ PO TBCR: 40.0000 meq | EXTENDED_RELEASE_TABLET | Freq: Once | ORAL | Status: DC

## 2019-07-07 MED ORDER — SODIUM CHLORIDE 0.9 % IV SOLN: INTRAVENOUS | Status: AC

## 2019-07-07 MED ORDER — SODIUM CHLORIDE 0.9 % IV BOLUS
500.0000 mL | Freq: Once | INTRAVENOUS | Status: AC
  Administered 2019-07-07: 500 mL via INTRAVENOUS

## 2019-07-07 MED ORDER — MAGNESIUM SULFATE 2 GM/50ML IV SOLN
2.0000 g | Freq: Once | INTRAVENOUS | Status: DC
  Filled 2019-07-07: qty 50
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
## 2019-07-07 NOTE — Progress Notes (Addendum)
Notified provider of pt vomiting and complains of nausea. Liquid emesis yellowish green in color pt filled wash basin. Complains of nausea. RN will continue to monitor.  0800 Pt refused Midodrine and Protonix states can't drink anything due to nausea. Educated patient on need for taking medications. Pt bp soft explain the need for Midodrine.   Metellus.Mess MD placed orders for NS bolus and continuous fluids.    1030 RN called into room pt states would try to take meds with ginger ale. All medications expected Midodrine refused.   1835 Pt has refused medication all day except for Midodrine. Pt has complained of nausea all day. Patient has had poor appetite. No food just sips of ginger ale to take Midodrine.  Pt midline infiltrated. IV team came to bedside to attempt to place another midline. Unable to place due to poor vascular access. Suggest PICC line. Pt left arm swollen. RN removed midline and elevated L arm on pillows. Paged MD to make aware.  Awaiting orders.   MD returned page made aware of pt refusing medications and need for PICC line.   IV team recommended IR PICC placement. MD informed. Informed upcoming shift. Orders need to be placed by MD.  1940 Paged on call MD to have orders placed for IR PICC.

## 2019-07-08 LAB — CULTURE, BLOOD (ROUTINE X 2)
Culture: NO GROWTH
Special Requests: ADEQUATE

## 2019-07-08 MED ORDER — POTASSIUM CHLORIDE CRYS ER 20 MEQ PO TBCR
40.0000 meq | EXTENDED_RELEASE_TABLET | Freq: Once | ORAL | Status: AC
  Administered 2019-07-08: 15:00:00 40 meq via ORAL
  Filled 2019-07-08: qty 2
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
## 2019-07-08 NOTE — Plan of Care (Signed)
  Problem: Clinical Measurements: Goal: Ability to maintain clinical measurements within normal limits will improve Outcome: Progressing Goal: Diagnostic test results will improve Outcome: Progressing Goal: Respiratory complications will improve Outcome: Progressing Goal: Cardiovascular complication will be avoided Outcome: Progressing   Problem: Coping: Goal: Level of anxiety will decrease Outcome: Progressing   
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
## 2019-07-09 ENCOUNTER — Inpatient Hospital Stay (HOSPITAL_COMMUNITY): Payer: Medicare Other

## 2019-07-09 HISTORY — PX: IR FLUORO GUIDE CV LINE RIGHT: IMG2283

## 2019-07-09 HISTORY — PX: IR US GUIDE VASC ACCESS RIGHT: IMG2390

## 2019-07-09 LAB — BASIC METABOLIC PANEL
Anion gap: 7 (ref 5–15)
BUN: 17 mg/dL (ref 6–20)
CO2: 21 mmol/L — ABNORMAL LOW (ref 22–32)
Calcium: 7.5 mg/dL — ABNORMAL LOW (ref 8.9–10.3)
Chloride: 113 mmol/L — ABNORMAL HIGH (ref 98–111)
Creatinine, Ser: 0.4 mg/dL — ABNORMAL LOW (ref 0.61–1.24)
GFR calc Af Amer: >60 mL/min (ref >60)
GFR calc non Af Amer: >60 mL/min (ref >60)
Glucose, Bld: 84 mg/dL (ref 70–99)
Potassium: 3.5 mmol/L (ref 3.5–5.1)
Sodium: 141 mmol/L (ref 135–145)

## 2019-07-09 LAB — CBC WITH DIFFERENTIAL/PLATELET
Abs Immature Granulocytes: 0.12 10*3/uL — ABNORMAL HIGH (ref 0.00–0.07)
Basophils Absolute: 0 10*3/uL (ref 0.0–0.1)
Basophils Relative: 1 %
Eosinophils Absolute: 0.1 10*3/uL (ref 0.0–0.5)
Eosinophils Relative: 1 %
HCT: 26 % — ABNORMAL LOW (ref 39.0–52.0)
Hemoglobin: 7.7 g/dL — ABNORMAL LOW (ref 13.0–17.0)
Immature Granulocytes: 1 %
Lymphocytes Relative: 39 %
Lymphs Abs: 3.2 10*3/uL (ref 0.7–4.0)
MCH: 24 pg — ABNORMAL LOW (ref 26.0–34.0)
MCHC: 29.6 g/dL — ABNORMAL LOW (ref 30.0–36.0)
MCV: 81 fL (ref 80.0–100.0)
Monocytes Absolute: 1.2 10*3/uL — ABNORMAL HIGH (ref 0.1–1.0)
Monocytes Relative: 14 %
Neutro Abs: 3.7 10*3/uL (ref 1.7–7.7)
Neutrophils Relative %: 44 %
Platelets: 355 10*3/uL (ref 150–400)
RBC: 3.21 MIL/uL — ABNORMAL LOW (ref 4.22–5.81)
RDW: 17.9 % — ABNORMAL HIGH (ref 11.5–15.5)
WBC: 8.4 10*3/uL (ref 4.0–10.5)
nRBC: 0 % (ref 0.0–0.2)

## 2019-07-09 LAB — MAGNESIUM: Magnesium: 1.3 mg/dL — ABNORMAL LOW (ref 1.7–2.4)

## 2019-07-09 MED ORDER — LIDOCAINE HCL 1 % IJ SOLN
INTRAMUSCULAR | Status: AC
  Filled 2019-07-09: qty 20

## 2019-07-09 MED ORDER — CHLORHEXIDINE GLUCONATE CLOTH 2 % EX PADS
6.0000 | MEDICATED_PAD | Freq: Every day | CUTANEOUS | Status: DC
  Administered 2019-07-09 – 2019-07-10 (×2): 6 via TOPICAL

## 2019-07-09 MED ORDER — LIDOCAINE HCL (PF) 1 % IJ SOLN
INTRAMUSCULAR | Status: DC | PRN
  Administered 2019-07-09: 20 mg

## 2019-07-09 MED ORDER — POTASSIUM CHLORIDE CRYS ER 20 MEQ PO TBCR
40.0000 meq | EXTENDED_RELEASE_TABLET | Freq: Once | ORAL | Status: AC
  Administered 2019-07-09: 17:00:00 40 meq via ORAL
  Filled 2019-07-09: qty 2

## 2019-07-09 MED ORDER — MAGNESIUM SULFATE 2 GM/50ML IV SOLN
2.0000 g | Freq: Once | INTRAVENOUS | Status: AC
  Administered 2019-07-09: 17:00:00 2 g via INTRAVENOUS
  Filled 2019-07-09: qty 50

## 2019-07-09 NOTE — Care Management Important Message (Signed)
Important Message  Patient Details  Name: [name] [name] MRN: [id number] Date of Birth: 10-Apr-1966   Medicare Important Message Given:  Yes     [name], RN 07/09/2019, 1:10 PM
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
## 2019-07-09 NOTE — Procedures (Signed)
Interventional Radiology Procedure Note  Procedure: Right IJ tunneled CVC placement  Complications: None  Estimated Blood Loss: < 10 mL  Findings: 23 cm DL Power Line via right IJ vein with tip at SVC/RA junction. OK to use.  Venetia Night. Kathlene Cote, M.D Pager:  [phone]
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
## 2019-07-10 MED ORDER — MIDODRINE HCL 10 MG PO TABS: 10.0000 mg | ORAL_TABLET | Freq: Three times a day (TID) | ORAL | 1 refills | Status: AC

## 2019-07-10 MED ORDER — PROMETHAZINE HCL 25 MG PO TABS: 12.5000 mg | ORAL_TABLET | Freq: Four times a day (QID) | ORAL | 0 refills | Status: AC | PRN

## 2019-07-10 MED ORDER — POLYETHYLENE GLYCOL 3350 17 G PO PACK: 17.0000 g | PACK | Freq: Every day | ORAL | 0 refills | Status: DC | PRN

## 2019-07-10 MED ORDER — MAGNESIUM OXIDE 400 MG PO TABS: 400.0000 mg | ORAL_TABLET | Freq: Two times a day (BID) | ORAL | 1 refills | Status: AC

## 2019-07-10 MED ORDER — POTASSIUM CHLORIDE CRYS ER 20 MEQ PO TBCR: 20.0000 meq | EXTENDED_RELEASE_TABLET | Freq: Two times a day (BID) | ORAL | 1 refills | Status: AC

## 2019-07-10 NOTE — TOC Transition Note (Addendum)
Transition of Care [facility]) - CM/SW Discharge Note   Patient Details  Name: [name] [name] MRN: [id number] Date of Birth: [birth year]  [facility] CM/SW Contact:  [name], RN Phone Number: 07/10/2019, 12:36 PM   Clinical Narrative:    NCM made referral to [facility], she states they have had patient before and will be able to take him back for [facility], [place] and SW.  Soc will begin 24 to 48 hrs post dc.  Patient states he will need ambulance transport after 5 pm today. Transport set up with ptar for 5:15.  Forms are on chart.    Final next level of care: [place] Barriers to Discharge: No Barriers Identified   Patient Goals and CMS Choice Patient states their goals for this hospitalization and ongoing recovery are:: get better CMS Medicare.gov Compare Post Acute Care list provided to:: Patient Choice offered to / list presented to : Patient  Discharge Placement                       Discharge Plan and Services   Discharge Planning Services: CM Consult Post Acute Care Choice: Home Health          DME Arranged: (NA)         HH Arranged: RN, Social Work, Nurse's Aide [place] Agency: [facility] Date [place] Agency Contacted: 07/10/19 Time [place]: [phone] Representative spoke with at [name]: [place] ([place]) Interventions     Readmission Risk Interventions Readmission Risk Prevention Plan 07/09/2019 05/22/2019 05/20/2019  Transportation Screening Complete - Complete  PCP or Specialist Appt within 5-7 Days - - -  PCP or Specialist Appt within 3-5 Days - - -  Not Complete comments - - -  Home Care Screening - - -  Medication Review (RN CM) - - -  [place] or [place] Work Consult for [place] Planning/Counseling - - -  [place] - - -  Medication Review Press photographer) Complete - Complete  PCP or Specialist appointment within 3-5 days of discharge -  Complete Not Complete  PCP/Specialist Appt Not Complete comments - - DC date unknown  HRI or Home Care Consult Complete - Complete  SW Recovery Care/Counseling Consult Complete - Complete  Palliative Care Screening - - Not Applicable  Comments - - -  Skilled Nursing Facility Not Applicable - Complete  Some recent data might be hidden

## 2019-07-10 NOTE — Discharge Summary (Signed)
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  GLUCOSE 110*  --  105* 105* 89 84  BUN 15  --  12 12 13 17   CREATININE 0.39*  --  <0.30* 0.42* 0.38* 0.40*  CALCIUM 7.1*  --  7.6* 7.5* 7.6* 7.5*  MG 1.0* 1.4*  --   --  1.3* 1.3*   Liver Function Tests: Recent Labs  Lab 07/03/19 2001 07/04/19 0500 07/05/19 1746  AST 13* 12* 13*  ALT 13 13 12   ALKPHOS 92 82 98  BILITOT 0.5 0.4 <0.1*  PROT 5.1* 4.2* 4.9*  ALBUMIN 1.3* <1.0* 1.1*   No results for input(s): LIPASE, AMYLASE in the last 168 hours. No results for input(s): AMMONIA in the last 168 hours. CBC: Recent Labs  Lab 07/04/19 0500 07/05/19 1735 07/06/19 0228 07/07/19 1029 07/09/19 0727  WBC 6.5 7.2 6.8 12.0* 8.4  NEUTROABS 3.0 2.9 2.7 8.1* 3.7  HGB 7.2* 7.7* 7.2* 7.9* 7.7*  HCT 24.7* 25.8* 24.4* 26.9* 26.0*  MCV 82.9 80.6 80.5 80.1 81.0  PLT 325 361 302 349 355   Cardiac Enzymes: No results for input(s): CKTOTAL, CKMB, CKMBINDEX, TROPONINI in the  last 168 hours. BNP: Invalid input(s): POCBNP CBG: No results for input(s): GLUCAP in the last 168 hours. D-Dimer No results for input(s): DDIMER in the last 72 hours. Hgb A1c No results for input(s): HGBA1C in the last 72 hours. Lipid Profile No results for input(s): CHOL, HDL, LDLCALC, TRIG, CHOLHDL, LDLDIRECT in the last 72 hours. Thyroid function studies No results for input(s): TSH, T4TOTAL, T3FREE, THYROIDAB in the last 72 hours.  Invalid input(s): FREET3 Anemia work up No results for input(s): VITAMINB12, FOLATE, FERRITIN, TIBC, IRON, RETICCTPCT in the last 72 hours. Urinalysis    Component Value Date/Time   COLORURINE AMBER (A) 07/03/2019 2146   APPEARANCEUR TURBID (A) 07/03/2019 2146   LABSPEC 1.017 07/03/2019 2146   PHURINE 7.0 07/03/2019 2146   GLUCOSEU NEGATIVE 07/03/2019 2146   HGBUR SMALL (A) 07/03/2019 2146   BILIRUBINUR NEGATIVE 07/03/2019 2146   [place] NEGATIVE 07/03/2019 2146   PROTEINUR 100 (A) 07/03/2019 2146   UROBILINOGEN 1.0 03/16/2015 0835   NITRITE NEGATIVE 07/03/2019 2146   LEUKOCYTESUR MODERATE (A) 07/03/2019 2146   Sepsis Labs Invalid input(s): PROCALCITONIN,  WBC,  LACTICIDVEN Microbiology Recent Results (from the past 240 hour(s))  Blood Culture (routine x 2)     Status: None   Collection Time: 07/03/19  8:01 PM   Specimen: BLOOD  Result Value Ref Range Status   Specimen Description BLOOD LEFT ANTECUBITAL  Final   Special Requests AEROBIC BOTTLE ONLY Blood Culture adequate volume  Final   Culture   Final    NO GROWTH 5 DAYS Performed at [facility], [address] [place], [place] [address]    Report Status 07/08/2019 FINAL  Final  Urine culture     Status: Abnormal   Collection Time: 07/03/19  9:42 PM   Specimen: In/Out Cath Urine  Result Value Ref Range  Status   Specimen Description IN/OUT CATH URINE  Final   Special Requests   Final    NONE Performed at [facility], [place] [address], [place], [place] [address]     Culture MULTIPLE SPECIES PRESENT, SUGGEST RECOLLECTION (A)  Final   Report Status 07/04/2019 FINAL  Final  SARS CORONAVIRUS 2 (TAT 6-24 HRS) Nasopharyngeal Nasopharyngeal Swab     Status: None   Collection Time: 07/04/19  8:36 AM   Specimen: Nasopharyngeal Swab  Result Value Ref Range Status   SARS Coronavirus 2 NEGATIVE NEGATIVE Final    Comment: (NOTE) SARS-CoV-2 target nucleic acids are NOT DETECTED. The SARS-CoV-2 RNA is generally detectable in upper and lower respiratory specimens during the acute phase of infection. Negative results do not preclude SARS-CoV-2 infection, do not rule out co-infections with other pathogens, and should not be used as the sole basis for treatment or other patient management decisions. Negative results must be combined with clinical observations, patient history, and epidemiological information. The expected result is Negative. Fact Sheet for Patients: SugarRoll.be Fact Sheet for Healthcare Providers: https://www.woods-mathews.com/ This test is not yet approved or cleared by the [place] FDA and  has been authorized for detection and/or diagnosis of SARS-CoV-2 by FDA under an Emergency Use Authorization (EUA). This EUA will remain  in effect (meaning this test can be used) for the duration of the COVID-19 declaration under Section 56 4(b)(1) of the Act, 21 U.S.C. section 360bbb-3(b)(1), unless the authorization is terminated or revoked sooner. Performed at [facility], [place] [address], [place], [place] [address]   Urine Culture     Status: Abnormal   Collection Time: 07/05/19  5:30 PM   Specimen: Urine, Random  Result Value Ref Range Status   Specimen Description URINE, RANDOM  Final   Special Requests   Final    NONE Performed at [facility], [place] [address] [place],  [address]    Culture MULTIPLE SPECIES PRESENT, SUGGEST RECOLLECTION (A)  Final   Report Status 07/06/2019  FINAL  Final    Please note: You were cared for by a hospitalist during your hospital stay. Once you are discharged, your primary care physician will handle any further medical issues. Please note that NO REFILLS for any discharge medications will be authorized once you are discharged, as it is imperative that you return to your primary care physician (or establish a relationship with a primary care physician if you do not have one) for your post hospital discharge needs so that they can reassess your need for medications and monitor your lab values.    Time coordinating discharge: 40 minutes  SIGNED:   Shelly Coss, MD  [facility] 07/10/2019, 10:43 AM Pager [phone]  If 7PM-7AM, please contact night-coverage [email or web address] Password [email or web address]

## 2019-07-12 IMAGING — DX DG CHEST 1V PORT
1 series · 1 of 1 positions shown · non-contrast
Comparison: Chest radiograph October 31, 2016

CLINICAL DATA: Pain at port site.

EXAM:
PORTABLE CHEST 1 VIEW

[chest ap]
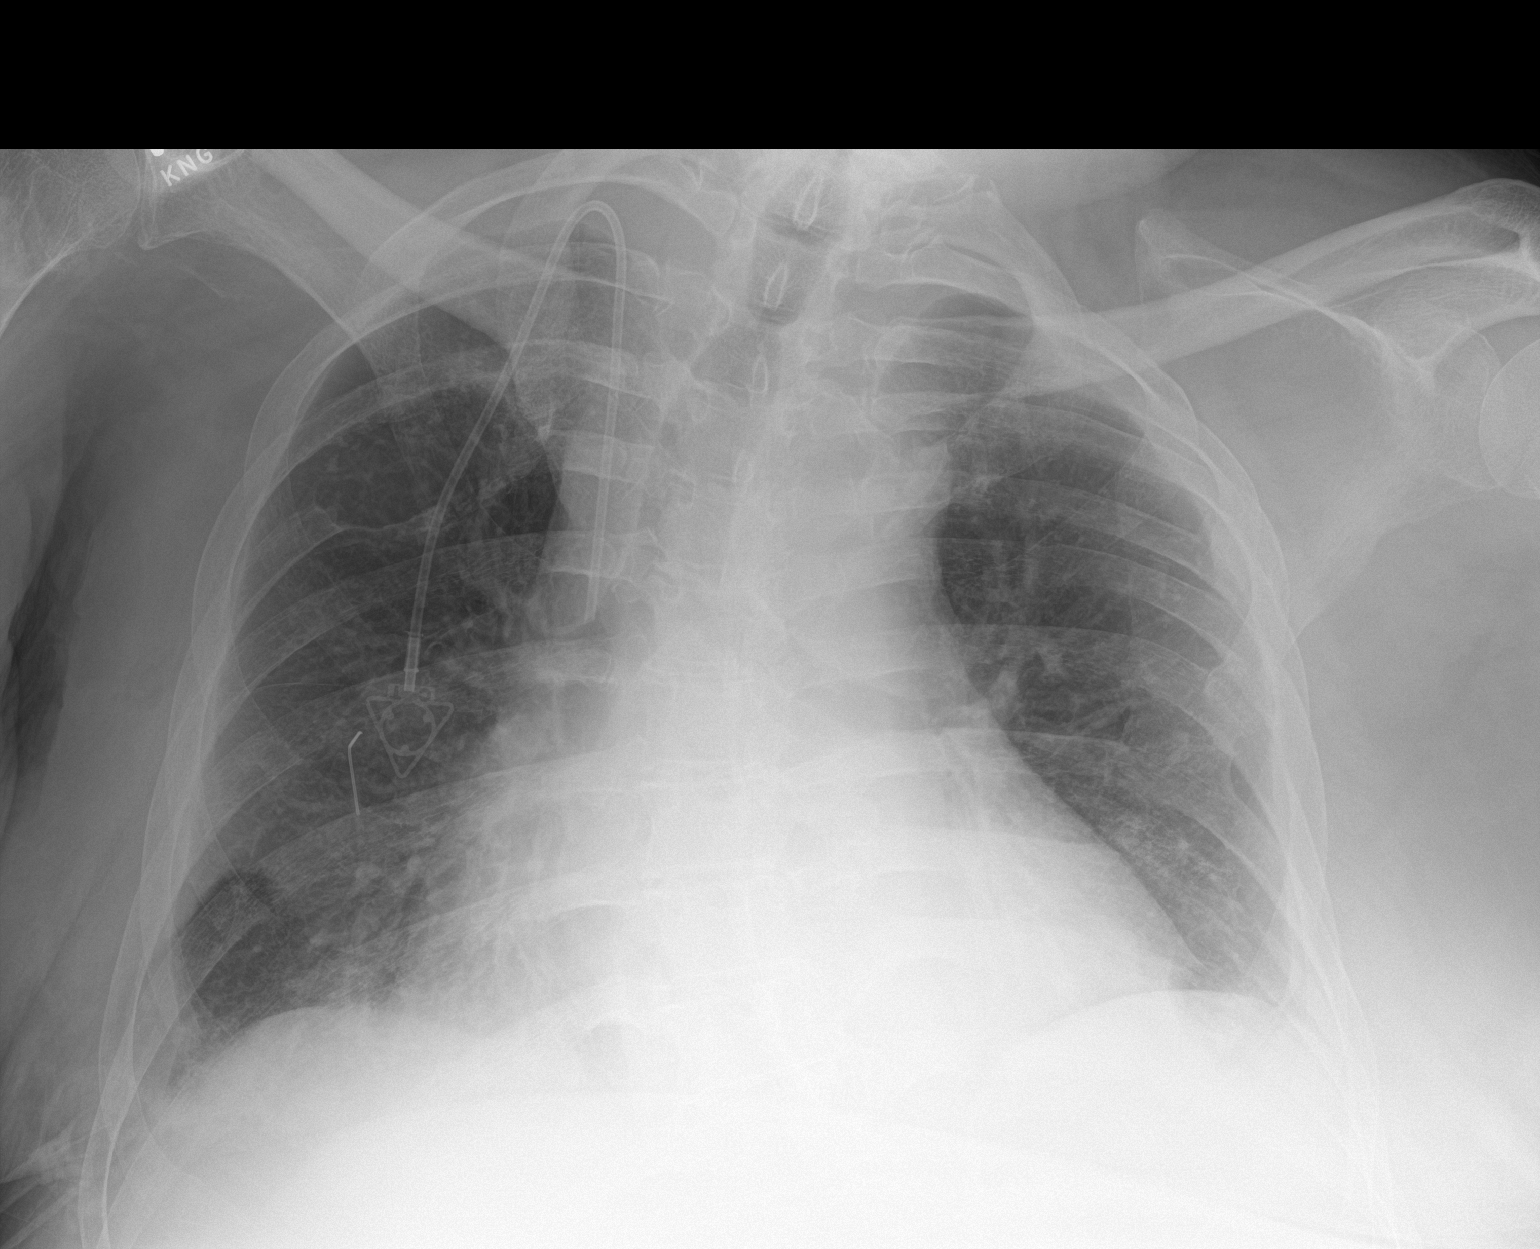

[1 of 1 positions shown; findings below may reference images not displayed]

FINDINGS: The cardiac silhouette is mildly enlarged even with consideration to
this portable examination with crowded vascular markings.
Mediastinal silhouette is nonsuspicious. Mild mid tracheal stenosis
may be dynamic. Similar chronic bronchitic changes. Blunting of the
RIGHT costophrenic angle with strandy densities. Overlying catheter.
Single lumen RIGHT chest Port-A-Cath, needle no longer projects at
the hub. Biapical pleural thickening. Expansile fifth rib lesion
again seen. Old LEFT rib fractures.
IMPRESSION: Needle no longer projects in Port-A-Cath hub.

Stable cardiomegaly and chronic bronchitic changes. Small RIGHT
pleural effusion or pleural thickening with RIGHT lung base
atelectasis.

## 2019-07-17 ENCOUNTER — Inpatient Hospital Stay (HOSPITAL_COMMUNITY)
Admission: EM | Admit: 2019-07-17 | Discharge: 2019-08-15 | DRG: 853 | Disposition: E | Payer: Medicare Other | Attending: Pulmonary Disease | Admitting: Pulmonary Disease

## 2019-07-17 ENCOUNTER — Other Ambulatory Visit: Payer: Self-pay

## 2019-07-17 ENCOUNTER — Encounter (HOSPITAL_COMMUNITY): Payer: Self-pay

## 2019-07-17 ENCOUNTER — Emergency Department (HOSPITAL_COMMUNITY): Payer: Medicare Other

## 2019-07-17 DIAGNOSIS — W3400XS Accidental discharge from unspecified firearms or gun, sequela: Secondary | ICD-10-CM | POA: Diagnosis not present

## 2019-07-17 DIAGNOSIS — Z7189 Other specified counseling: Secondary | ICD-10-CM | POA: Diagnosis not present

## 2019-07-17 DIAGNOSIS — N189 Chronic kidney disease, unspecified: Secondary | ICD-10-CM | POA: Diagnosis present

## 2019-07-17 DIAGNOSIS — I129 Hypertensive chronic kidney disease with stage 1 through stage 4 chronic kidney disease, or unspecified chronic kidney disease: Secondary | ICD-10-CM | POA: Diagnosis present

## 2019-07-17 DIAGNOSIS — Z933 Colostomy status: Secondary | ICD-10-CM | POA: Diagnosis not present

## 2019-07-17 DIAGNOSIS — T83510A Infection and inflammatory reaction due to cystostomy catheter, initial encounter: Secondary | ICD-10-CM | POA: Diagnosis present

## 2019-07-17 DIAGNOSIS — D500 Iron deficiency anemia secondary to blood loss (chronic): Secondary | ICD-10-CM | POA: Diagnosis present

## 2019-07-17 DIAGNOSIS — K3184 Gastroparesis: Secondary | ICD-10-CM | POA: Diagnosis not present

## 2019-07-17 DIAGNOSIS — Z9989 Dependence on other enabling machines and devices: Secondary | ICD-10-CM | POA: Diagnosis not present

## 2019-07-17 DIAGNOSIS — E870 Hyperosmolality and hypernatremia: Secondary | ICD-10-CM | POA: Diagnosis not present

## 2019-07-17 DIAGNOSIS — T07XXXA Unspecified multiple injuries, initial encounter: Secondary | ICD-10-CM | POA: Diagnosis not present

## 2019-07-17 DIAGNOSIS — E669 Obesity, unspecified: Secondary | ICD-10-CM | POA: Diagnosis present

## 2019-07-17 DIAGNOSIS — Z9359 Other cystostomy status: Secondary | ICD-10-CM | POA: Diagnosis not present

## 2019-07-17 DIAGNOSIS — L89119 Pressure ulcer of right upper back, unspecified stage: Secondary | ICD-10-CM | POA: Diagnosis present

## 2019-07-17 DIAGNOSIS — Z8711 Personal history of peptic ulcer disease: Secondary | ICD-10-CM

## 2019-07-17 DIAGNOSIS — M726 Necrotizing fasciitis: Secondary | ICD-10-CM | POA: Diagnosis present

## 2019-07-17 DIAGNOSIS — A419 Sepsis, unspecified organism: Secondary | ICD-10-CM | POA: Diagnosis present

## 2019-07-17 DIAGNOSIS — K567 Ileus, unspecified: Secondary | ICD-10-CM | POA: Diagnosis not present

## 2019-07-17 DIAGNOSIS — K219 Gastro-esophageal reflux disease without esophagitis: Secondary | ICD-10-CM | POA: Diagnosis present

## 2019-07-17 DIAGNOSIS — J90 Pleural effusion, not elsewhere classified: Secondary | ICD-10-CM | POA: Diagnosis present

## 2019-07-17 DIAGNOSIS — I959 Hypotension, unspecified: Secondary | ICD-10-CM | POA: Diagnosis not present

## 2019-07-17 DIAGNOSIS — K5909 Other constipation: Secondary | ICD-10-CM | POA: Diagnosis not present

## 2019-07-17 DIAGNOSIS — J9622 Acute and chronic respiratory failure with hypercapnia: Secondary | ICD-10-CM | POA: Diagnosis not present

## 2019-07-17 DIAGNOSIS — I9589 Other hypotension: Secondary | ICD-10-CM | POA: Diagnosis present

## 2019-07-17 DIAGNOSIS — R6521 Severe sepsis with septic shock: Secondary | ICD-10-CM | POA: Diagnosis present

## 2019-07-17 DIAGNOSIS — Z1624 Resistance to multiple antibiotics: Secondary | ICD-10-CM | POA: Diagnosis present

## 2019-07-17 DIAGNOSIS — D696 Thrombocytopenia, unspecified: Secondary | ICD-10-CM

## 2019-07-17 DIAGNOSIS — Z20822 Contact with and (suspected) exposure to covid-19: Secondary | ICD-10-CM | POA: Diagnosis present

## 2019-07-17 DIAGNOSIS — Z881 Allergy status to other antibiotic agents status: Secondary | ICD-10-CM | POA: Diagnosis not present

## 2019-07-17 DIAGNOSIS — L89629 Pressure ulcer of left heel, unspecified stage: Secondary | ICD-10-CM | POA: Diagnosis present

## 2019-07-17 DIAGNOSIS — Z8701 Personal history of pneumonia (recurrent): Secondary | ICD-10-CM

## 2019-07-17 DIAGNOSIS — Y846 Urinary catheterization as the cause of abnormal reaction of the patient, or of later complication, without mention of misadventure at the time of the procedure: Secondary | ICD-10-CM | POA: Diagnosis present

## 2019-07-17 DIAGNOSIS — J9601 Acute respiratory failure with hypoxia: Secondary | ICD-10-CM | POA: Diagnosis not present

## 2019-07-17 DIAGNOSIS — Z7401 Bed confinement status: Secondary | ICD-10-CM

## 2019-07-17 DIAGNOSIS — N390 Urinary tract infection, site not specified: Secondary | ICD-10-CM | POA: Diagnosis present

## 2019-07-17 DIAGNOSIS — L089 Local infection of the skin and subcutaneous tissue, unspecified: Secondary | ICD-10-CM | POA: Diagnosis not present

## 2019-07-17 DIAGNOSIS — L89213 Pressure ulcer of right hip, stage 3: Secondary | ICD-10-CM | POA: Diagnosis present

## 2019-07-17 DIAGNOSIS — N319 Neuromuscular dysfunction of bladder, unspecified: Secondary | ICD-10-CM | POA: Diagnosis present

## 2019-07-17 DIAGNOSIS — Z978 Presence of other specified devices: Secondary | ICD-10-CM

## 2019-07-17 DIAGNOSIS — G4733 Obstructive sleep apnea (adult) (pediatric): Secondary | ICD-10-CM | POA: Diagnosis present

## 2019-07-17 DIAGNOSIS — D6959 Other secondary thrombocytopenia: Secondary | ICD-10-CM | POA: Diagnosis not present

## 2019-07-17 DIAGNOSIS — Z87440 Personal history of urinary (tract) infections: Secondary | ICD-10-CM

## 2019-07-17 DIAGNOSIS — F329 Major depressive disorder, single episode, unspecified: Secondary | ICD-10-CM | POA: Diagnosis present

## 2019-07-17 DIAGNOSIS — G8250 Quadriplegia, unspecified: Secondary | ICD-10-CM | POA: Diagnosis present

## 2019-07-17 DIAGNOSIS — R6883 Chills (without fever): Secondary | ICD-10-CM | POA: Diagnosis not present

## 2019-07-17 DIAGNOSIS — Z66 Do not resuscitate: Secondary | ICD-10-CM | POA: Diagnosis not present

## 2019-07-17 DIAGNOSIS — Z9911 Dependence on respirator [ventilator] status: Secondary | ICD-10-CM

## 2019-07-17 DIAGNOSIS — M869 Osteomyelitis, unspecified: Secondary | ICD-10-CM | POA: Diagnosis not present

## 2019-07-17 DIAGNOSIS — D801 Nonfamilial hypogammaglobulinemia: Secondary | ICD-10-CM | POA: Diagnosis present

## 2019-07-17 DIAGNOSIS — E43 Unspecified severe protein-calorie malnutrition: Secondary | ICD-10-CM | POA: Diagnosis present

## 2019-07-17 DIAGNOSIS — Z803 Family history of malignant neoplasm of breast: Secondary | ICD-10-CM

## 2019-07-17 DIAGNOSIS — M009 Pyogenic arthritis, unspecified: Secondary | ICD-10-CM | POA: Diagnosis present

## 2019-07-17 DIAGNOSIS — B965 Pseudomonas (aeruginosa) (mallei) (pseudomallei) as the cause of diseases classified elsewhere: Secondary | ICD-10-CM | POA: Diagnosis present

## 2019-07-17 DIAGNOSIS — L89329 Pressure ulcer of left buttock, unspecified stage: Secondary | ICD-10-CM | POA: Diagnosis present

## 2019-07-17 DIAGNOSIS — L89893 Pressure ulcer of other site, stage 3: Secondary | ICD-10-CM | POA: Diagnosis present

## 2019-07-17 DIAGNOSIS — Z980 Intestinal bypass and anastomosis status: Secondary | ICD-10-CM

## 2019-07-17 DIAGNOSIS — Z515 Encounter for palliative care: Secondary | ICD-10-CM | POA: Diagnosis not present

## 2019-07-17 DIAGNOSIS — J9690 Respiratory failure, unspecified, unspecified whether with hypoxia or hypercapnia: Secondary | ICD-10-CM

## 2019-07-17 DIAGNOSIS — G9341 Metabolic encephalopathy: Secondary | ICD-10-CM | POA: Diagnosis not present

## 2019-07-17 DIAGNOSIS — R079 Chest pain, unspecified: Secondary | ICD-10-CM

## 2019-07-17 DIAGNOSIS — Z95828 Presence of other vascular implants and grafts: Secondary | ICD-10-CM | POA: Diagnosis not present

## 2019-07-17 DIAGNOSIS — I739 Peripheral vascular disease, unspecified: Secondary | ICD-10-CM | POA: Diagnosis present

## 2019-07-17 DIAGNOSIS — Z833 Family history of diabetes mellitus: Secondary | ICD-10-CM

## 2019-07-17 DIAGNOSIS — Z888 Allergy status to other drugs, medicaments and biological substances status: Secondary | ICD-10-CM | POA: Diagnosis not present

## 2019-07-17 DIAGNOSIS — E876 Hypokalemia: Secondary | ICD-10-CM | POA: Diagnosis present

## 2019-07-17 DIAGNOSIS — K311 Adult hypertrophic pyloric stenosis: Secondary | ICD-10-CM | POA: Diagnosis present

## 2019-07-17 DIAGNOSIS — D638 Anemia in other chronic diseases classified elsewhere: Secondary | ICD-10-CM | POA: Diagnosis present

## 2019-07-17 DIAGNOSIS — L89154 Pressure ulcer of sacral region, stage 4: Secondary | ICD-10-CM | POA: Diagnosis present

## 2019-07-17 DIAGNOSIS — Z8739 Personal history of other diseases of the musculoskeletal system and connective tissue: Secondary | ICD-10-CM | POA: Diagnosis not present

## 2019-07-17 DIAGNOSIS — I96 Gangrene, not elsewhere classified: Secondary | ICD-10-CM | POA: Diagnosis not present

## 2019-07-17 DIAGNOSIS — G8253 Quadriplegia, C5-C7 complete: Secondary | ICD-10-CM | POA: Diagnosis present

## 2019-07-17 DIAGNOSIS — J9621 Acute and chronic respiratory failure with hypoxia: Secondary | ICD-10-CM | POA: Diagnosis not present

## 2019-07-17 DIAGNOSIS — L8990 Pressure ulcer of unspecified site, unspecified stage: Secondary | ICD-10-CM | POA: Diagnosis not present

## 2019-07-17 DIAGNOSIS — E861 Hypovolemia: Secondary | ICD-10-CM | POA: Diagnosis not present

## 2019-07-17 DIAGNOSIS — Z4659 Encounter for fitting and adjustment of other gastrointestinal appliance and device: Secondary | ICD-10-CM

## 2019-07-17 DIAGNOSIS — Z0189 Encounter for other specified special examinations: Secondary | ICD-10-CM

## 2019-07-17 DIAGNOSIS — R5381 Other malaise: Secondary | ICD-10-CM | POA: Diagnosis present

## 2019-07-17 DIAGNOSIS — Z6826 Body mass index (BMI) 26.0-26.9, adult: Secondary | ICD-10-CM

## 2019-07-17 DIAGNOSIS — R000 Tachycardia, unspecified: Secondary | ICD-10-CM | POA: Diagnosis not present

## 2019-07-17 DIAGNOSIS — D72829 Elevated white blood cell count, unspecified: Secondary | ICD-10-CM | POA: Diagnosis not present

## 2019-07-17 DIAGNOSIS — Z960 Presence of urogenital implants: Secondary | ICD-10-CM | POA: Diagnosis not present

## 2019-07-17 DIAGNOSIS — Z01818 Encounter for other preprocedural examination: Secondary | ICD-10-CM

## 2019-07-17 DIAGNOSIS — S14109S Unspecified injury at unspecified level of cervical spinal cord, sequela: Secondary | ICD-10-CM | POA: Diagnosis not present

## 2019-07-17 DIAGNOSIS — D631 Anemia in chronic kidney disease: Secondary | ICD-10-CM | POA: Diagnosis present

## 2019-07-17 DIAGNOSIS — Z79899 Other long term (current) drug therapy: Secondary | ICD-10-CM

## 2019-07-17 DIAGNOSIS — R110 Nausea: Secondary | ICD-10-CM | POA: Diagnosis not present

## 2019-07-17 DIAGNOSIS — Z981 Arthrodesis status: Secondary | ICD-10-CM

## 2019-07-17 DIAGNOSIS — J9600 Acute respiratory failure, unspecified whether with hypoxia or hypercapnia: Secondary | ICD-10-CM

## 2019-07-17 DIAGNOSIS — L89890 Pressure ulcer of other site, unstageable: Secondary | ICD-10-CM | POA: Diagnosis present

## 2019-07-17 DIAGNOSIS — J45909 Unspecified asthma, uncomplicated: Secondary | ICD-10-CM | POA: Diagnosis present

## 2019-07-17 DIAGNOSIS — J9602 Acute respiratory failure with hypercapnia: Secondary | ICD-10-CM | POA: Diagnosis not present

## 2019-07-17 DIAGNOSIS — M791 Myalgia, unspecified site: Secondary | ICD-10-CM | POA: Diagnosis not present

## 2019-07-17 LAB — COMPREHENSIVE METABOLIC PANEL
ALT: 11 U/L (ref 0–44)
AST: 14 U/L — ABNORMAL LOW (ref 15–41)
Albumin: 1.1 g/dL — ABNORMAL LOW (ref 3.5–5.0)
Alkaline Phosphatase: 81 U/L (ref 38–126)
Anion gap: 7 (ref 5–15)
BUN: 31 mg/dL — ABNORMAL HIGH (ref 6–20)
CO2: 19 mmol/L — ABNORMAL LOW (ref 22–32)
Calcium: 7 mg/dL — ABNORMAL LOW (ref 8.9–10.3)
Chloride: 115 mmol/L — ABNORMAL HIGH (ref 98–111)
Creatinine, Ser: <0.3 mg/dL — ABNORMAL LOW (ref 0.61–1.24)
Glucose, Bld: 86 mg/dL (ref 70–99)
Potassium: 3 mmol/L — ABNORMAL LOW (ref 3.5–5.1)
Sodium: 141 mmol/L (ref 135–145)
Total Bilirubin: 0.7 mg/dL (ref 0.3–1.2)
Total Protein: 4.3 g/dL — ABNORMAL LOW (ref 6.5–8.1)

## 2019-07-17 LAB — CBC WITH DIFFERENTIAL/PLATELET
Abs Immature Granulocytes: 0.16 10*3/uL — ABNORMAL HIGH (ref 0.00–0.07)
Band Neutrophils: 12 %
Basophils Absolute: 0 10*3/uL (ref 0.0–0.1)
Basophils Relative: 0 %
Blasts: 0 %
Eosinophils Absolute: 0 10*3/uL (ref 0.0–0.5)
Eosinophils Relative: 0 %
HCT: 23.2 % — ABNORMAL LOW (ref 39.0–52.0)
Hemoglobin: 6.7 g/dL — CL (ref 13.0–17.0)
Lymphocytes Relative: 5 %
Lymphs Abs: 0.8 10*3/uL (ref 0.7–4.0)
MCH: 23.8 pg — ABNORMAL LOW (ref 26.0–34.0)
MCHC: 28.9 g/dL — ABNORMAL LOW (ref 30.0–36.0)
MCV: 82.6 fL (ref 80.0–100.0)
Metamyelocytes Relative: 1 %
Monocytes Absolute: 0.7 10*3/uL (ref 0.1–1.0)
Monocytes Relative: 4 %
Myelocytes: 0 %
Neutro Abs: 14.6 10*3/uL — ABNORMAL HIGH (ref 1.7–7.7)
Neutrophils Relative %: 78 %
Other: 0 %
Platelets: 181 10*3/uL (ref 150–400)
Promyelocytes Relative: 0 %
RBC: 2.81 MIL/uL — ABNORMAL LOW (ref 4.22–5.81)
RDW: 18.9 % — ABNORMAL HIGH (ref 11.5–15.5)
WBC: 16.3 10*3/uL — ABNORMAL HIGH (ref 4.0–10.5)
nRBC: 0 % (ref 0.0–0.2)
nRBC: 0 /100 WBC

## 2019-07-17 LAB — URINALYSIS, ROUTINE W REFLEX MICROSCOPIC
Bilirubin Urine: NEGATIVE
Glucose, UA: NEGATIVE mg/dL
Ketones, ur: NEGATIVE mg/dL
Nitrite: NEGATIVE
Protein, ur: 30 mg/dL — AB
Specific Gravity, Urine: 1.014 (ref 1.005–1.030)
pH: 6 (ref 5.0–8.0)

## 2019-07-17 LAB — LACTIC ACID, PLASMA: Lactic Acid, Venous: 1.7 mmol/L (ref 0.5–1.9)

## 2019-07-17 LAB — LIPASE, BLOOD: Lipase: 12 U/L (ref 11–51)

## 2019-07-17 LAB — MAGNESIUM: Magnesium: 1.2 mg/dL — ABNORMAL LOW (ref 1.7–2.4)

## 2019-07-17 LAB — PHOSPHORUS: Phosphorus: 1.7 mg/dL — ABNORMAL LOW (ref 2.5–4.6)

## 2019-07-17 MED ORDER — MAGNESIUM OXIDE 400 (241.3 MG) MG PO TABS
400.0000 mg | ORAL_TABLET | Freq: Two times a day (BID) | ORAL | Status: DC
  Administered 2019-07-18 – 2019-07-21 (×7): 400 mg via ORAL
  Filled 2019-07-17 (×9): qty 1

## 2019-07-17 MED ORDER — MAGNESIUM SULFATE 50 % IJ SOLN: 2.0000 g | Freq: Once | INTRAMUSCULAR | Status: DC

## 2019-07-17 MED ORDER — MIDODRINE HCL 5 MG PO TABS
10.0000 mg | ORAL_TABLET | Freq: Three times a day (TID) | ORAL | Status: DC
  Administered 2019-07-18 (×2): 10 mg via ORAL
  Filled 2019-07-17 (×4): qty 2

## 2019-07-17 MED ORDER — ASCORBIC ACID 500 MG PO TABS
500.0000 mg | ORAL_TABLET | Freq: Every day | ORAL | Status: DC
  Administered 2019-07-18 – 2019-07-21 (×4): 500 mg via ORAL
  Filled 2019-07-17 (×5): qty 1

## 2019-07-17 MED ORDER — POTASSIUM CHLORIDE CRYS ER 20 MEQ PO TBCR: 20.0000 meq | EXTENDED_RELEASE_TABLET | Freq: Two times a day (BID) | ORAL | Status: DC

## 2019-07-17 MED ORDER — ENOXAPARIN SODIUM 40 MG/0.4ML ~~LOC~~ SOLN
40.0000 mg | Freq: Every day | SUBCUTANEOUS | Status: DC
  Administered 2019-07-18 – 2019-07-20 (×3): 40 mg via SUBCUTANEOUS
  Filled 2019-07-17 (×3): qty 0.4

## 2019-07-17 MED ORDER — SODIUM CHLORIDE 0.9% IV SOLUTION: Freq: Once | INTRAVENOUS | Status: AC

## 2019-07-17 MED ORDER — LACTATED RINGERS IV BOLUS (SEPSIS)
250.0000 mL | Freq: Once | INTRAVENOUS | Status: AC
  Administered 2019-07-17: 250 mL via INTRAVENOUS

## 2019-07-17 MED ORDER — LACTATED RINGERS IV BOLUS: 1000.0000 mL | Freq: Once | INTRAVENOUS | Status: DC

## 2019-07-17 MED ORDER — ADULT MULTIVITAMIN W/MINERALS CH
1.0000 | ORAL_TABLET | Freq: Every day | ORAL | Status: DC
  Administered 2019-07-18 – 2019-07-21 (×4): 1 via ORAL
  Filled 2019-07-17 (×5): qty 1

## 2019-07-17 MED ORDER — FESOTERODINE FUMARATE ER 8 MG PO TB24
8.0000 mg | ORAL_TABLET | Freq: Every day | ORAL | Status: DC
  Administered 2019-07-18 – 2019-07-21 (×4): 8 mg via ORAL
  Filled 2019-07-17 (×5): qty 1

## 2019-07-17 MED ORDER — LEVALBUTEROL HCL 0.63 MG/3ML IN NEBU: 0.6300 mg | INHALATION_SOLUTION | RESPIRATORY_TRACT | Status: DC | PRN

## 2019-07-17 MED ORDER — GUAIFENESIN 100 MG/5ML PO SOLN: 5.0000 mL | ORAL | Status: DC | PRN

## 2019-07-17 MED ORDER — ONDANSETRON HCL 4 MG PO TABS: 4.0000 mg | ORAL_TABLET | Freq: Four times a day (QID) | ORAL | Status: DC | PRN

## 2019-07-17 MED ORDER — JUVEN PO PACK
1.0000 | PACK | Freq: Two times a day (BID) | ORAL | Status: DC
  Filled 2019-07-17 (×3): qty 1

## 2019-07-17 MED ORDER — ONDANSETRON HCL 4 MG/2ML IJ SOLN
4.0000 mg | Freq: Four times a day (QID) | INTRAMUSCULAR | Status: DC | PRN
  Administered 2019-07-20 – 2019-07-21 (×2): 4 mg via INTRAVENOUS
  Filled 2019-07-17 (×2): qty 2

## 2019-07-17 MED ORDER — ZINC SULFATE 220 (50 ZN) MG PO CAPS
220.0000 mg | ORAL_CAPSULE | Freq: Two times a day (BID) | ORAL | Status: DC
  Administered 2019-07-18 – 2019-07-21 (×7): 220 mg via ORAL
  Filled 2019-07-17 (×8): qty 1

## 2019-07-17 MED ORDER — COLLAGENASE 250 UNIT/GM EX OINT
1.0000 "application " | TOPICAL_OINTMENT | Freq: Every day | CUTANEOUS | Status: DC
  Administered 2019-07-19 – 2019-07-31 (×11): 1 via TOPICAL
  Filled 2019-07-17: qty 30

## 2019-07-17 MED ORDER — SODIUM CHLORIDE 0.9 % IV SOLN
2.0000 g | Freq: Once | INTRAVENOUS | Status: AC
  Administered 2019-07-17: 18:00:00 2 g via INTRAVENOUS
  Filled 2019-07-17: qty 2

## 2019-07-17 MED ORDER — POTASSIUM CHLORIDE 10 MEQ/100ML IV SOLN
INTRAVENOUS | Status: AC
  Administered 2019-07-17: 10 meq via INTRAVENOUS
  Filled 2019-07-17: qty 100

## 2019-07-17 MED ORDER — SUCRALFATE 1 G PO TABS
1.0000 g | ORAL_TABLET | Freq: Four times a day (QID) | ORAL | Status: DC
  Administered 2019-07-18 – 2019-07-21 (×11): 1 g via ORAL
  Filled 2019-07-17 (×12): qty 1

## 2019-07-17 MED ORDER — PROMETHAZINE HCL 25 MG PO TABS
12.5000 mg | ORAL_TABLET | Freq: Four times a day (QID) | ORAL | Status: DC | PRN
  Administered 2019-07-19: 12.5 mg via ORAL
  Filled 2019-07-17: qty 1

## 2019-07-17 MED ORDER — LACTATED RINGERS IV BOLUS (SEPSIS)
1000.0000 mL | Freq: Once | INTRAVENOUS | Status: AC
  Administered 2019-07-17: 1000 mL via INTRAVENOUS

## 2019-07-17 MED ORDER — SODIUM CHLORIDE 0.9 % IV SOLN: INTRAVENOUS | Status: DC

## 2019-07-17 MED ORDER — METRONIDAZOLE IN NACL 5-0.79 MG/ML-% IV SOLN
500.0000 mg | Freq: Once | INTRAVENOUS | Status: AC
  Administered 2019-07-17: 500 mg via INTRAVENOUS
  Filled 2019-07-17 (×2): qty 100

## 2019-07-17 MED ORDER — POTASSIUM CHLORIDE 10 MEQ/100ML IV SOLN
10.0000 meq | INTRAVENOUS | Status: DC
  Administered 2019-07-18: 10 meq via INTRAVENOUS
  Filled 2019-07-17: qty 100

## 2019-07-17 MED ORDER — K PHOS MONO-SOD PHOS DI & MONO 155-852-130 MG PO TABS
500.0000 mg | ORAL_TABLET | Freq: Once | ORAL | Status: AC
  Administered 2019-07-18: 500 mg via ORAL
  Filled 2019-07-17: qty 2

## 2019-07-17 MED ORDER — NOREPINEPHRINE 4 MG/250ML-% IV SOLN
0.0000 ug/min | INTRAVENOUS | Status: DC
  Administered 2019-07-17: 2 ug/min via INTRAVENOUS
  Administered 2019-07-18: 6 ug/min via INTRAVENOUS
  Administered 2019-07-18: 08:00:00 14 ug/min via INTRAVENOUS
  Administered 2019-07-19: 05:00:00 6 ug/min via INTRAVENOUS
  Filled 2019-07-17 (×5): qty 250

## 2019-07-17 MED ORDER — ACETAMINOPHEN 325 MG PO TABS
650.0000 mg | ORAL_TABLET | Freq: Four times a day (QID) | ORAL | Status: DC | PRN
  Administered 2019-07-19: 650 mg via ORAL
  Filled 2019-07-17: qty 2

## 2019-07-17 MED ORDER — ONDANSETRON HCL 4 MG/2ML IJ SOLN
4.0000 mg | Freq: Once | INTRAMUSCULAR | Status: AC
  Administered 2019-07-17: 4 mg via INTRAVENOUS
  Filled 2019-07-17: qty 2

## 2019-07-17 MED ORDER — ENSURE MAX PROTEIN PO LIQD
11.0000 [oz_av] | Freq: Every day | ORAL | Status: DC
  Filled 2019-07-17: qty 330

## 2019-07-17 MED ORDER — FERROUS SULFATE 325 (65 FE) MG PO TABS
325.0000 mg | ORAL_TABLET | Freq: Every day | ORAL | Status: DC
  Administered 2019-07-18 – 2019-07-22 (×4): 325 mg via ORAL
  Filled 2019-07-17 (×6): qty 1

## 2019-07-17 MED ORDER — BACLOFEN 10 MG PO TABS
20.0000 mg | ORAL_TABLET | Freq: Two times a day (BID) | ORAL | Status: DC
  Administered 2019-07-18 – 2019-07-19 (×4): 20 mg via ORAL
  Filled 2019-07-17 (×5): qty 2

## 2019-07-17 MED ORDER — MAGNESIUM SULFATE 2 GM/50ML IV SOLN
2.0000 g | Freq: Once | INTRAVENOUS | Status: AC
  Administered 2019-07-17: 2 g via INTRAVENOUS
  Filled 2019-07-17: qty 50

## 2019-07-17 MED ORDER — ACETAMINOPHEN 650 MG RE SUPP: 650.0000 mg | Freq: Four times a day (QID) | RECTAL | Status: DC | PRN

## 2019-07-17 MED ORDER — LACTATED RINGERS IV BOLUS
500.0000 mL | Freq: Once | INTRAVENOUS | Status: AC
  Administered 2019-07-17: 500 mL via INTRAVENOUS

## 2019-07-17 MED ORDER — ALBUTEROL SULFATE (2.5 MG/3ML) 0.083% IN NEBU
3.0000 mL | INHALATION_SOLUTION | Freq: Four times a day (QID) | RESPIRATORY_TRACT | Status: DC | PRN
  Administered 2019-07-28 (×2): 3 mL via RESPIRATORY_TRACT
  Filled 2019-07-17 (×2): qty 3

## 2019-07-17 NOTE — ED Notes (Signed)
Called Vicky in phlebotomy for labs

## 2019-07-17 NOTE — ED Notes (Signed)
Phlebotomy at bedside.
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
## 2019-07-17 NOTE — Progress Notes (Signed)
A consult was received from an ED physician for cefepime per pharmacy dosing.    The patient's profile has been reviewed for ht/wt/allergies/indication/available labs.   A one time order has been placed for cefepime 2 gr IV x1 .     Further antibiotics/pharmacy consults should be ordered by admitting physician if indicated.                          Royetta Asal, PharmD, BCPS 08/07/2019 5:24 PM

## 2019-07-17 NOTE — ED Notes (Signed)
CRITICAL VALUE ALERT  Critical Value:  Hgb 6.7  Date & Time Notied:  08/05/2019  Provider Notified: A.Zenia Resides EDP  Orders Received/Actions taken: blood admin
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
## 2019-07-17 NOTE — ED Notes (Signed)
Unable to gain IV access after multiple attempts. Pt has existing dual lumin jug line unable to draw labs from it either. Fluids and ABX started due to possible sepsis criteria.

## 2019-07-17 NOTE — ED Triage Notes (Signed)
ES reports from [facility], seen for suprapubic cath changed, staff sent for tachycardia. Pt quadriplegic. Pt denies pain. Hx of frequent UTI.  BP 90 palp(Normal baseline 90-100) BP monitor 95/47 HR 132 RR 30 Sp02 96 RA Temp 97.2

## 2019-07-18 DIAGNOSIS — A419 Sepsis, unspecified organism: Principal | ICD-10-CM

## 2019-07-18 DIAGNOSIS — G8250 Quadriplegia, unspecified: Secondary | ICD-10-CM

## 2019-07-18 DIAGNOSIS — S14109S Unspecified injury at unspecified level of cervical spinal cord, sequela: Secondary | ICD-10-CM

## 2019-07-18 DIAGNOSIS — Z960 Presence of urogenital implants: Secondary | ICD-10-CM

## 2019-07-18 DIAGNOSIS — L8990 Pressure ulcer of unspecified site, unspecified stage: Secondary | ICD-10-CM

## 2019-07-18 DIAGNOSIS — D72829 Elevated white blood cell count, unspecified: Secondary | ICD-10-CM

## 2019-07-18 DIAGNOSIS — Z9359 Other cystostomy status: Secondary | ICD-10-CM

## 2019-07-18 DIAGNOSIS — I959 Hypotension, unspecified: Secondary | ICD-10-CM

## 2019-07-18 DIAGNOSIS — N390 Urinary tract infection, site not specified: Secondary | ICD-10-CM

## 2019-07-18 DIAGNOSIS — L89154 Pressure ulcer of sacral region, stage 4: Secondary | ICD-10-CM

## 2019-07-18 DIAGNOSIS — Z933 Colostomy status: Secondary | ICD-10-CM

## 2019-07-18 DIAGNOSIS — Z881 Allergy status to other antibiotic agents status: Secondary | ICD-10-CM

## 2019-07-18 DIAGNOSIS — Z8739 Personal history of other diseases of the musculoskeletal system and connective tissue: Secondary | ICD-10-CM

## 2019-07-18 DIAGNOSIS — Z888 Allergy status to other drugs, medicaments and biological substances status: Secondary | ICD-10-CM

## 2019-07-18 DIAGNOSIS — W3400XS Accidental discharge from unspecified firearms or gun, sequela: Secondary | ICD-10-CM

## 2019-07-18 LAB — GLUCOSE, CAPILLARY
Glucose-Capillary: 122 mg/dL — ABNORMAL HIGH (ref 70–99)
Glucose-Capillary: 140 mg/dL — ABNORMAL HIGH (ref 70–99)
Glucose-Capillary: 174 mg/dL — ABNORMAL HIGH (ref 70–99)

## 2019-07-18 LAB — BASIC METABOLIC PANEL
Anion gap: 7 (ref 5–15)
BUN: 27 mg/dL — ABNORMAL HIGH (ref 6–20)
CO2: 15 mmol/L — ABNORMAL LOW (ref 22–32)
Calcium: 6.7 mg/dL — ABNORMAL LOW (ref 8.9–10.3)
Chloride: 115 mmol/L — ABNORMAL HIGH (ref 98–111)
Creatinine, Ser: 0.31 mg/dL — ABNORMAL LOW (ref 0.61–1.24)
GFR calc Af Amer: >60 mL/min (ref >60)
GFR calc non Af Amer: >60 mL/min (ref >60)
Glucose, Bld: 121 mg/dL — ABNORMAL HIGH (ref 70–99)
Potassium: 4.6 mmol/L (ref 3.5–5.1)
Sodium: 137 mmol/L (ref 135–145)

## 2019-07-18 LAB — PROTIME-INR
INR: 1.2 (ref 0.8–1.2)
Prothrombin Time: 15.3 seconds — ABNORMAL HIGH (ref 11.4–15.2)

## 2019-07-18 LAB — CBC
HCT: 28.9 % — ABNORMAL LOW (ref 39.0–52.0)
Hemoglobin: 9.4 g/dL — ABNORMAL LOW (ref 13.0–17.0)
MCH: 25 pg — ABNORMAL LOW (ref 26.0–34.0)
MCHC: 32.5 g/dL (ref 30.0–36.0)
MCV: 76.9 fL — ABNORMAL LOW (ref 80.0–100.0)
Platelets: 194 10*3/uL (ref 150–400)
RBC: 3.76 MIL/uL — ABNORMAL LOW (ref 4.22–5.81)
RDW: 18.2 % — ABNORMAL HIGH (ref 11.5–15.5)
WBC: 24.4 10*3/uL — ABNORMAL HIGH (ref 4.0–10.5)
nRBC: 0 % (ref 0.0–0.2)

## 2019-07-18 LAB — MRSA PCR SCREENING: MRSA by PCR: POSITIVE — AB

## 2019-07-18 LAB — CORTISOL: Cortisol, Plasma: 28.9 ug/dL

## 2019-07-18 LAB — LACTIC ACID, PLASMA: Lactic Acid, Venous: 1.6 mmol/L (ref 0.5–1.9)

## 2019-07-18 LAB — SARS CORONAVIRUS 2 (TAT 6-24 HRS): SARS Coronavirus 2: NEGATIVE

## 2019-07-18 LAB — PREPARE RBC (CROSSMATCH)

## 2019-07-18 MED ORDER — MIDODRINE HCL 5 MG PO TABS
20.0000 mg | ORAL_TABLET | Freq: Three times a day (TID) | ORAL | Status: DC
  Administered 2019-07-18 – 2019-07-21 (×5): 20 mg via ORAL
  Filled 2019-07-18 (×7): qty 4

## 2019-07-18 MED ORDER — ALBUMIN HUMAN 25 % IV SOLN
25.0000 g | Freq: Four times a day (QID) | INTRAVENOUS | Status: AC
  Administered 2019-07-18 – 2019-07-19 (×4): 25 g via INTRAVENOUS
  Filled 2019-07-18: qty 50
  Filled 2019-07-18 (×3): qty 100
  Filled 2019-07-18: qty 50

## 2019-07-18 MED ORDER — VANCOMYCIN HCL 1250 MG/250ML IV SOLN
1250.0000 mg | Freq: Two times a day (BID) | INTRAVENOUS | Status: DC
  Administered 2019-07-19: 1250 mg via INTRAVENOUS
  Filled 2019-07-18: qty 250

## 2019-07-18 MED ORDER — POTASSIUM CHLORIDE 10 MEQ/100ML IV SOLN
INTRAVENOUS | Status: AC
  Administered 2019-07-18: 10 meq via INTRAVENOUS
  Filled 2019-07-18: qty 100

## 2019-07-18 MED ORDER — MUPIROCIN 2 % EX OINT
1.0000 "application " | TOPICAL_OINTMENT | Freq: Two times a day (BID) | CUTANEOUS | Status: AC
  Administered 2019-07-18 – 2019-07-22 (×10): 1 via NASAL
  Filled 2019-07-18 (×2): qty 22

## 2019-07-18 MED ORDER — INSULIN ASPART 100 UNIT/ML ~~LOC~~ SOLN
0.0000 [IU] | SUBCUTANEOUS | Status: DC
  Administered 2019-07-18: 2 [IU] via SUBCUTANEOUS
  Administered 2019-07-18 – 2019-07-20 (×3): 1 [IU] via SUBCUTANEOUS
  Administered 2019-07-21: 2 [IU] via SUBCUTANEOUS
  Administered 2019-07-22: 1 [IU] via SUBCUTANEOUS
  Administered 2019-07-22 (×2): 2 [IU] via SUBCUTANEOUS
  Administered 2019-07-23 – 2019-07-26 (×12): 1 [IU] via SUBCUTANEOUS
  Administered 2019-07-26: 2 [IU] via SUBCUTANEOUS
  Administered 2019-07-26: 1 [IU] via SUBCUTANEOUS
  Administered 2019-07-26: 2 [IU] via SUBCUTANEOUS
  Administered 2019-07-26 – 2019-07-28 (×8): 1 [IU] via SUBCUTANEOUS
  Administered 2019-07-28: 21:00:00 2 [IU] via SUBCUTANEOUS
  Administered 2019-07-28: 1 [IU] via SUBCUTANEOUS
  Administered 2019-07-29: 12:00:00 2 [IU] via SUBCUTANEOUS
  Administered 2019-07-29: 1 [IU] via SUBCUTANEOUS
  Administered 2019-07-29 (×2): 2 [IU] via SUBCUTANEOUS
  Administered 2019-07-30: 1 [IU] via SUBCUTANEOUS
  Administered 2019-07-30: 2 [IU] via SUBCUTANEOUS
  Administered 2019-07-31 – 2019-08-01 (×6): 1 [IU] via SUBCUTANEOUS

## 2019-07-18 MED ORDER — CHLORHEXIDINE GLUCONATE CLOTH 2 % EX PADS
6.0000 | MEDICATED_PAD | Freq: Every day | CUTANEOUS | Status: DC
  Administered 2019-07-18 – 2019-07-31 (×13): 6 via TOPICAL

## 2019-07-18 MED ORDER — SODIUM CHLORIDE 0.9 % IV SOLN
2.0000 g | Freq: Three times a day (TID) | INTRAVENOUS | Status: DC
  Administered 2019-07-18 – 2019-07-21 (×10): 2 g via INTRAVENOUS
  Filled 2019-07-18 (×10): qty 2

## 2019-07-18 MED ORDER — BOOST PLUS PO LIQD
237.0000 mL | Freq: Three times a day (TID) | ORAL | Status: DC
  Administered 2019-07-18 – 2019-07-20 (×3): 237 mL via ORAL
  Filled 2019-07-18 (×18): qty 237

## 2019-07-18 MED ORDER — VANCOMYCIN HCL 1500 MG/300ML IV SOLN
1500.0000 mg | Freq: Once | INTRAVENOUS | Status: AC
  Administered 2019-07-18: 1500 mg via INTRAVENOUS
  Filled 2019-07-18: qty 300

## 2019-07-18 NOTE — Consult Note (Signed)
[place] Nurse ostomy consultnote Stoma type/location:LLQ Colostomy Stomal assessment/size:1 3/4" round, extremely pale Peristomal assessment; pouch intact from change Treatment options for stomal/peristomal skin:Skin barrier ring Output: soft brown stool Ostomy pouching: 2pc.2 and 3/4 inch ostomy pouching system with skin barrier ring. Pouch is [employer], Skin barrier is Kellie Simmering # 2 and ostomy barrier ring is Kellie Simmering # [phone]  [facility] Nurse Consult Note: Reason for Consult:Patient known to [facility] team from previous admissions.   Wound type:Pressure Pressure Injury POA: Yes Measurement: Left trochanter: 5cm x 4cm x  100% slough moist Right flank:3cm x 3cm Unstageable (100% nonviable) Right lateral chest:9cm x 7cm x 0.1cm Right thoracic area proximal4cm x 3cm x 0.1cm; 100% necrotic Right thoracic area distal;5cm x 2cm x 0.2cm; 50% nonviable/50% pale non granual Right abdominal area; 3cmx 4cm x 3cm ; 80% pale, non granular/20% yellow/green Sacrum/buttocks/ischial tuberosities: 26cm x 30cm x 0.4cm 75% red, 25% nonviable; area will not heal at this point, ideally to keep current status  Scattered partial thickness ulcers over the right hip; pale with fibrinous material, dry  Left medial heel: 3cm x 3cm x 0cm; 100% hard stable eschar   Wound bed:As noted above Drainage (amount, consistency, odor)odor associated with necrotic tissue.  The right flank wounds have some green drainage; with patient's history and chronic osteomyelitis the large wound that covers the sacrum and bilateral ischium drainage very heavily.  The right flank wound despite be superficial drains a tremendous amount of serous fluid.   Periwound:dry, intact with evidence of previous wound healing. Dressing procedure/placement/frequency: Air mattress provided  1. Right upper back wound: santyl, moist gauze, foam topper 2. Right flank; foam dressing only 3. Right proximal and distal lateral abdominal wounds; santyl, moist  gauze, dry dressing 4. Right medial abdominal wound; santyl, moist gauze, dry dressing 5. Sacrum and bilateral ischial; saline moist gauze, ABD pads, secure with tape.  6. Bilateral heel ulcers; silicone foam 7. Right hip wound; scattered, cover with foam    Discussed POC with patient and bedside nurse.  Re consult if needed, will not follow at this time. Thanks  [name] [name] [employer], RN,CWOCN, CNS, [place] [phone])
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
## 2019-07-18 NOTE — Progress Notes (Signed)
Pharmacy Antibiotic Note  [name] [name] is a 54 y.o. male admitted on 07/22/2019 with UTI.  Pharmacy has been consulted for cefepime dosing.  Plan: Cefepime 2 Gm IV q8h F/u scr/cultures  Weight: 157 lb (71.2 kg)  Temp (24hrs), Avg:99.4 F (37.4 C), Min:98.7 F (37.1 C), Max:100.1 F (37.8 C)  Recent Labs  Lab 08/06/2019 1926  WBC 16.3*  CREATININE <0.30*  LATICACIDVEN 1.7    CrCl cannot be calculated (This lab value cannot be used to calculate CrCl because it is not a number: <0.30).    Allergies  Allergen Reactions  . Ferumoxytol Anaphylaxis and Other (See Comments)    (Feraheme) = "SYNCOPE; patient tolerated Venofer 10/01/18 s rxn"   . Oxybutynin Other (See Comments)    Hallucinations    . Vancomycin Other (See Comments)    ARF 05-2016 -- affects kidneys     Antimicrobials this admission: 3/3 cefepime >>    >>   Dose adjustments this admission:   Microbiology results:  BCx:   UCx:    Sputum:    MRSA PCR:   Thank you for allowing pharmacy to be a part of this patient's care.  Dorrene German 07/18/2019 12:54 AM

## 2019-07-18 NOTE — Progress Notes (Signed)
Received this message from [name] outpatient provider:   "I am the NP that saw [name] [name] at [facility]. WE did Not change his SP tube. HE had not been here in 2 years and when I walked into the room he looked very sick. We sent him straight to the ED. I am not sure if his tube has been changed but whatever cultures or bacteria are growing may be colonized and not correct. I wanted to clarify this. Thanks. LEt me know if you have any questions" - [name] Gravel, NP  Message relayed to [facility]. Will continue to monitor.

## 2019-07-18 NOTE — ED Notes (Signed)
ICU Charge RN came and check patient.

## 2019-07-18 NOTE — Progress Notes (Signed)
Pt declined use of cpap tonight.  Pt was encouraged to call should he change his mind.
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
## 2019-07-18 NOTE — Progress Notes (Signed)
Pharmacy Antibiotic Note  [name] [name] is a 54 y.o. male admitted on 08/05/2019 with sepsis.  Pharmacy has been consulted for vanc dosing on top of cefepime already started 3/3  Plan: Vanc 1500mg  IV x 1 then 1250mg  IV q12 - goal AUC 400-550   Weight: 157 lb (71.2 kg)  Temp (24hrs), Avg:99.4 F (37.4 C), Min:98.7 F (37.1 C), Max:100.1 F (37.8 C)  Recent Labs  Lab 08/02/2019 1907 07/19/2019 1926 07/18/19 0939  WBC  --  16.3* 24.4*  CREATININE  --  <0.30* 0.31*  LATICACIDVEN 1.6 1.7  --     Estimated Creatinine Clearance: 108.8 mL/min (A) (by C-G formula based on SCr of 0.31 mg/dL (L)).    Allergies  Allergen Reactions  . Ferumoxytol Anaphylaxis and Other (See Comments)    (Feraheme) = "SYNCOPE; patient tolerated Venofer 10/01/18 s rxn"   . Oxybutynin Other (See Comments)    Hallucinations    . Vancomycin Other (See Comments)    ARF 05-2016 -- affects kidneys      Thank you for allowing pharmacy to be a part of this patient's care.  Kara Mead 07/18/2019 2:55 PM
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
## 2019-07-18 NOTE — ED Notes (Signed)
I attempted to call report to ICU, but there has been no answer.

## 2019-07-19 ENCOUNTER — Inpatient Hospital Stay (HOSPITAL_COMMUNITY): Payer: Medicare Other

## 2019-07-19 DIAGNOSIS — R110 Nausea: Secondary | ICD-10-CM

## 2019-07-19 DIAGNOSIS — M791 Myalgia, unspecified site: Secondary | ICD-10-CM

## 2019-07-19 DIAGNOSIS — R6883 Chills (without fever): Secondary | ICD-10-CM

## 2019-07-19 LAB — BASIC METABOLIC PANEL
Anion gap: 6 (ref 5–15)
BUN: 21 mg/dL — ABNORMAL HIGH (ref 6–20)
CO2: 16 mmol/L — ABNORMAL LOW (ref 22–32)
Calcium: 7.2 mg/dL — ABNORMAL LOW (ref 8.9–10.3)
Chloride: 115 mmol/L — ABNORMAL HIGH (ref 98–111)
Creatinine, Ser: <0.3 mg/dL — ABNORMAL LOW (ref 0.61–1.24)
Glucose, Bld: 92 mg/dL (ref 70–99)
Potassium: 3.6 mmol/L (ref 3.5–5.1)
Sodium: 137 mmol/L (ref 135–145)

## 2019-07-19 LAB — GLUCOSE, CAPILLARY
Glucose-Capillary: 91 mg/dL (ref 70–99)
Glucose-Capillary: 78 mg/dL (ref 70–99)
Glucose-Capillary: 89 mg/dL (ref 70–99)
Glucose-Capillary: 83 mg/dL (ref 70–99)
Glucose-Capillary: 79 mg/dL (ref 70–99)
Glucose-Capillary: 79 mg/dL (ref 70–99)
Glucose-Capillary: 99 mg/dL (ref 70–99)

## 2019-07-19 LAB — TYPE AND SCREEN
ABO/RH(D): B POS
Antibody Screen: NEGATIVE
Unit division: 0

## 2019-07-19 LAB — BLOOD GAS, ARTERIAL
Acid-base deficit: 3.9 mmol/L — ABNORMAL HIGH (ref 0.0–2.0)
Bicarbonate: 18.9 mmol/L — ABNORMAL LOW (ref 20.0–28.0)
FIO2: 21
O2 Saturation: 88.9 %
Patient temperature: 100
pCO2 arterial: 27.7 mmHg — ABNORMAL LOW (ref 32.0–48.0)
pH, Arterial: 7.452 — ABNORMAL HIGH (ref 7.350–7.450)
pO2, Arterial: 56.9 mmHg — ABNORMAL LOW (ref 83.0–108.0)

## 2019-07-19 LAB — CBC
HCT: 25.1 % — ABNORMAL LOW (ref 39.0–52.0)
Hemoglobin: 8.2 g/dL — ABNORMAL LOW (ref 13.0–17.0)
MCH: 25.1 pg — ABNORMAL LOW (ref 26.0–34.0)
MCHC: 32.7 g/dL (ref 30.0–36.0)
MCV: 76.8 fL — ABNORMAL LOW (ref 80.0–100.0)
Platelets: 127 10*3/uL — ABNORMAL LOW (ref 150–400)
RBC: 3.27 MIL/uL — ABNORMAL LOW (ref 4.22–5.81)
RDW: 18 % — ABNORMAL HIGH (ref 11.5–15.5)
WBC: 21.6 10*3/uL — ABNORMAL HIGH (ref 4.0–10.5)
nRBC: 0 % (ref 0.0–0.2)

## 2019-07-19 LAB — BPAM RBC
Blood Product Expiration Date: 202104042359
ISSUE DATE / TIME: 202103040318
Unit Type and Rh: 7300

## 2019-07-19 LAB — URINE CULTURE

## 2019-07-19 LAB — MAGNESIUM: Magnesium: 1.4 mg/dL — ABNORMAL LOW (ref 1.7–2.4)

## 2019-07-19 LAB — PHOSPHORUS: Phosphorus: 2 mg/dL — ABNORMAL LOW (ref 2.5–4.6)

## 2019-07-19 MED ORDER — SODIUM CHLORIDE 0.9% FLUSH
10.0000 mL | Freq: Two times a day (BID) | INTRAVENOUS | Status: DC
  Administered 2019-07-19 – 2019-07-29 (×22): 10 mL
  Administered 2019-07-30: 40 mL
  Administered 2019-07-30 – 2019-08-01 (×4): 10 mL

## 2019-07-19 MED ORDER — NOREPINEPHRINE 4 MG/250ML-% IV SOLN
0.0000 ug/min | INTRAVENOUS | Status: DC
  Administered 2019-07-19: 2 ug/min via INTRAVENOUS
  Administered 2019-07-19: 11 ug/min via INTRAVENOUS
  Administered 2019-07-20: 10 ug/min via INTRAVENOUS
  Administered 2019-07-20: 5 ug/min via INTRAVENOUS
  Administered 2019-07-20: 10 ug/min via INTRAVENOUS
  Administered 2019-07-21: 25 ug/min via INTRAVENOUS
  Administered 2019-07-21: 27 ug/min via INTRAVENOUS
  Administered 2019-07-22: 20 ug/min via INTRAVENOUS
  Administered 2019-07-22: 26 ug/min via INTRAVENOUS
  Administered 2019-07-22: 18 ug/min via INTRAVENOUS
  Administered 2019-07-22: 17 ug/min via INTRAVENOUS
  Administered 2019-07-22 (×2): 21 ug/min via INTRAVENOUS
  Administered 2019-07-23: 25 ug/min via INTRAVENOUS
  Administered 2019-07-23: 12 ug/min via INTRAVENOUS
  Administered 2019-07-23: 23 ug/min via INTRAVENOUS
  Administered 2019-07-23: 18 ug/min via INTRAVENOUS
  Administered 2019-07-23: 26 ug/min via INTRAVENOUS
  Administered 2019-07-24: 30 ug/min via INTRAVENOUS
  Filled 2019-07-19 (×22): qty 250

## 2019-07-19 MED ORDER — SODIUM CHLORIDE 0.9 % IV SOLN
INTRAVENOUS | Status: DC | PRN
  Administered 2019-07-25: 18:00:00 1000 mL via INTRAVENOUS
  Administered 2019-07-29 – 2019-07-31 (×2): 500 mL via INTRAVENOUS

## 2019-07-19 MED ORDER — FENTANYL CITRATE (PF) 100 MCG/2ML IJ SOLN
50.0000 ug | INTRAMUSCULAR | Status: DC | PRN
  Filled 2019-07-19: qty 2

## 2019-07-19 MED ORDER — MAGNESIUM SULFATE 4 GM/100ML IV SOLN
4.0000 g | Freq: Once | INTRAVENOUS | Status: AC
  Administered 2019-07-19: 10:00:00 4 g via INTRAVENOUS
  Filled 2019-07-19: qty 100

## 2019-07-19 MED ORDER — POTASSIUM CHLORIDE CRYS ER 20 MEQ PO TBCR
40.0000 meq | EXTENDED_RELEASE_TABLET | Freq: Once | ORAL | Status: AC
  Administered 2019-07-19: 40 meq via ORAL
  Filled 2019-07-19: qty 2

## 2019-07-19 MED ORDER — OXYCODONE HCL 5 MG PO TABS
5.0000 mg | ORAL_TABLET | ORAL | Status: DC | PRN
  Administered 2019-07-19: 5 mg via ORAL
  Filled 2019-07-19: qty 1

## 2019-07-19 MED ORDER — LACTATED RINGERS IV BOLUS
1000.0000 mL | Freq: Once | INTRAVENOUS | Status: AC
  Administered 2019-07-19: 1000 mL via INTRAVENOUS

## 2019-07-19 MED ORDER — ALBUMIN HUMAN 25 % IV SOLN
INTRAVENOUS | Status: AC
  Filled 2019-07-19: qty 50

## 2019-07-19 MED ORDER — SODIUM CHLORIDE 0.9% FLUSH: 10.0000 mL | INTRAVENOUS | Status: DC | PRN

## 2019-07-19 NOTE — Progress Notes (Signed)
Called sister and updated on declining clinical status.  She re-affirms full code status.  Erskine Emery MD

## 2019-07-19 NOTE — Progress Notes (Signed)
[facility] for Infectious Disease   Reason for visit: Follow up on leukocytosis  Interval History: doesn't feel well, some nausea, myalgias, chills.  Urine culture pending, blood cultures ngtd.  WBC down to 21.6.  Remains afebrile.     Physical Exam: Constitutional:  Vitals:   07/19/19 0804 07/19/19 0903  BP: (!) 133/98 100/72  Pulse: 91 92  Resp: 16 17  Temp:    SpO2: 100% 100%   patient appears in NAD Eyes: anicteric Respiratory: Normal respiratory effort; CTA B Cardiovascular: RRR GI: soft, nt, nd  Review of Systems: Constitutional: positive for chills Respiratory: negative for cough Gastrointestinal: positive for nausea Integument/breast: negative for rash  Lab Results  Component Value Date   WBC 21.6 (H) 07/19/2019   HGB 8.2 (L) 07/19/2019   HCT 25.1 (L) 07/19/2019   MCV 76.8 (L) 07/19/2019   PLT 127 (L) 07/19/2019    Lab Results  Component Value Date   CREATININE <0.30 (L) 07/19/2019   BUN 21 (H) 07/19/2019   NA 137 07/19/2019   K 3.6 07/19/2019   CL 115 (H) 07/19/2019   CO2 16 (L) 07/19/2019    Lab Results  Component Value Date   ALT 11 08/06/2019   AST 14 (L) 07/30/2019   ALKPHOS 81 07/26/2019     Microbiology: Recent Results (from the past 240 hour(s))  SARS CORONAVIRUS 2 (TAT 6-24 HRS) Nasopharyngeal Nasopharyngeal Swab     Status: None   Collection Time: 07/31/2019  7:00 PM   Specimen: Nasopharyngeal Swab  Result Value Ref Range Status   SARS Coronavirus 2 NEGATIVE NEGATIVE Final    Comment: (NOTE) SARS-CoV-2 target nucleic acids are NOT DETECTED. The SARS-CoV-2 RNA is generally detectable in upper and lower respiratory specimens during the acute phase of infection. Negative results do not preclude SARS-CoV-2 infection, do not rule out co-infections with other pathogens, and should not be used as the sole basis for treatment or other patient management decisions. Negative results must be combined with clinical observations, patient  history, and epidemiological information. The expected result is Negative. Fact Sheet for Patients: SugarRoll.be Fact Sheet for Healthcare Providers: https://www.woods-mathews.com/ This test is not yet approved or cleared by the [place] FDA and  has been authorized for detection and/or diagnosis of SARS-CoV-2 by FDA under an Emergency Use Authorization (EUA). This EUA will remain  in effect (meaning this test can be used) for the duration of the COVID-19 declaration under Section 56 4(b)(1) of the Act, 21 U.S.C. section 360bbb-3(b)(1), unless the authorization is terminated or revoked sooner. Performed at [facility], [place] [address], [place], [place] [address]   Blood Culture (routine x 2)     Status: None (Preliminary result)   Collection Time: 08/07/2019  7:26 PM   Specimen: BLOOD LEFT HAND  Result Value Ref Range Status   Specimen Description   Final    BLOOD LEFT HAND BOTTLES DRAWN AEROBIC ONLY Blood Culture results may not be optimal due to an inadequate volume of blood received in culture bottles Performed at [facility], [place] [address], [place], [place] [address]    Special Requests   Final    NONE Performed at [facility], [place] [address] [place], [place] [address]    Culture   Final    NO GROWTH < 12 HOURS Performed at [place] [address], [place], [place] [address]    Report Status PENDING  Incomplete  MRSA PCR Screening  Status: Abnormal   Collection Time: 07/18/19  7:59 AM   Specimen: Nasal Mucosa; Nasopharyngeal  Result Value Ref Range Status   MRSA by PCR POSITIVE (A) NEGATIVE Final    Comment:        The GeneXpert MRSA Assay (FDA approved for NASAL specimens only), is one component of a comprehensive MRSA colonization surveillance program. It is not intended to diagnose MRSA infection nor to guide or monitor treatment for MRSA infections. RESULT  CALLED TO, READ BACK BY AND VERIFIED WITH: ROMINS, H. @ [id number] 07/18/2019 PERRY, J. Performed at [facility], [place] [address], [place], [place] [address]     Impression/Plan:  1. Leukocytosis - some improvement on empiric therapy.  ? If more of a viral illness with myalgias, chills.  Possibly wounds.    Continue with cefepime for now with history of Pseudomonas in the past in the urine and to cover wounds.  Will stop vancomycin  2.  Wounds - for debridement by [name] next week when medically stable.    3.  Hypotension - chronic vs some sepsis.  Improving and moving out of ICU.    Dr. Baxter Flattery available over the weekend if needed
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
## 2019-07-19 NOTE — Progress Notes (Signed)
[facility] Progress Note Patient Name: [name] [name] DOB: Aug 19, 1965 MRN: [id number]   Date of Service  07/19/2019  HPI/Events of Note  Red alert for change in mental status. Bedside RN reports more confused throughout the day. Reportedly alert and oriented yesterday. Bedside team aware of increased confusion thought to be secondary to sepsis.  eICU Interventions  Stat head CT ordered     Intervention Category Major Interventions: Change in mental status - evaluation and management  Judd Lien 07/19/2019, 9:53 PM

## 2019-07-19 NOTE — Progress Notes (Signed)
RT put CPT on hold so He could wear CPAP

## 2019-07-19 NOTE — Progress Notes (Signed)
More confused, septic. Bps dropping. ABG okay. Will keep on ICU service. Bolused another liter LR.  May have to have urology take a look at leaking [facility] Monday, still draining just needs abd pads around it changed intermittently. Guarded prognosis.  Erskine Emery MD PCCM
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
## 2019-07-20 LAB — CBC
HCT: 25.5 % — ABNORMAL LOW (ref 39.0–52.0)
Hemoglobin: 8.3 g/dL — ABNORMAL LOW (ref 13.0–17.0)
MCH: 24.6 pg — ABNORMAL LOW (ref 26.0–34.0)
MCHC: 32.5 g/dL (ref 30.0–36.0)
MCV: 75.7 fL — ABNORMAL LOW (ref 80.0–100.0)
Platelets: 71 10*3/uL — ABNORMAL LOW (ref 150–400)
RBC: 3.37 MIL/uL — ABNORMAL LOW (ref 4.22–5.81)
RDW: 18 % — ABNORMAL HIGH (ref 11.5–15.5)
WBC: 21.2 10*3/uL — ABNORMAL HIGH (ref 4.0–10.5)
nRBC: 0 % (ref 0.0–0.2)

## 2019-07-20 LAB — GLUCOSE, CAPILLARY
Glucose-Capillary: 87 mg/dL (ref 70–99)
Glucose-Capillary: 89 mg/dL (ref 70–99)
Glucose-Capillary: 135 mg/dL — ABNORMAL HIGH (ref 70–99)
Glucose-Capillary: 135 mg/dL — ABNORMAL HIGH (ref 70–99)
Glucose-Capillary: 120 mg/dL — ABNORMAL HIGH (ref 70–99)

## 2019-07-20 LAB — PHOSPHORUS: Phosphorus: 1.5 mg/dL — ABNORMAL LOW (ref 2.5–4.6)

## 2019-07-20 LAB — BASIC METABOLIC PANEL
Anion gap: 6 (ref 5–15)
BUN: 21 mg/dL — ABNORMAL HIGH (ref 6–20)
CO2: 14 mmol/L — ABNORMAL LOW (ref 22–32)
Calcium: 7.2 mg/dL — ABNORMAL LOW (ref 8.9–10.3)
Chloride: 119 mmol/L — ABNORMAL HIGH (ref 98–111)
Creatinine, Ser: <0.3 mg/dL — ABNORMAL LOW (ref 0.61–1.24)
Glucose, Bld: 98 mg/dL (ref 70–99)
Potassium: 3.9 mmol/L (ref 3.5–5.1)
Sodium: 139 mmol/L (ref 135–145)

## 2019-07-20 LAB — PROCALCITONIN: Procalcitonin: 3.13 ng/mL

## 2019-07-20 LAB — MAGNESIUM: Magnesium: 1.9 mg/dL (ref 1.7–2.4)

## 2019-07-20 MED ORDER — POTASSIUM CHLORIDE 10 MEQ/50ML IV SOLN
10.0000 meq | INTRAVENOUS | Status: AC
  Administered 2019-07-20 (×4): 10 meq via INTRAVENOUS
  Filled 2019-07-20 (×4): qty 50

## 2019-07-20 MED ORDER — ALBUMIN HUMAN 25 % IV SOLN
25.0000 g | Freq: Four times a day (QID) | INTRAVENOUS | Status: AC
  Administered 2019-07-20 – 2019-07-21 (×3): 25 g via INTRAVENOUS
  Filled 2019-07-20 (×2): qty 100
  Filled 2019-07-20: qty 50

## 2019-07-20 MED ORDER — SODIUM CHLORIDE 0.9 % IV SOLN
INTRAVENOUS | Status: DC | PRN
  Administered 2019-07-20: 15:00:00 250 mL via INTRAVENOUS

## 2019-07-20 MED ORDER — IPRATROPIUM-ALBUTEROL 0.5-2.5 (3) MG/3ML IN SOLN
3.0000 mL | Freq: Four times a day (QID) | RESPIRATORY_TRACT | Status: DC
  Administered 2019-07-20 – 2019-07-27 (×29): 3 mL via RESPIRATORY_TRACT
  Filled 2019-07-20 (×29): qty 3

## 2019-07-20 MED ORDER — SODIUM PHOSPHATES 45 MMOLE/15ML IV SOLN
30.0000 mmol | Freq: Once | INTRAVENOUS | Status: AC
  Administered 2019-07-20: 30 mmol via INTRAVENOUS
  Filled 2019-07-20: qty 10

## 2019-07-20 MED ORDER — MAGNESIUM SULFATE 2 GM/50ML IV SOLN
2.0000 g | Freq: Once | INTRAVENOUS | Status: AC
  Administered 2019-07-20: 2 g via INTRAVENOUS
  Filled 2019-07-20: qty 50

## 2019-07-20 MED ORDER — VASOPRESSIN 20 UNIT/ML IV SOLN
0.0400 [IU]/min | INTRAVENOUS | Status: DC
  Administered 2019-07-20 – 2019-07-24 (×6): 0.04 [IU]/min via INTRAVENOUS
  Filled 2019-07-20 (×11): qty 2

## 2019-07-20 MED ORDER — LACTATED RINGERS IV BOLUS: 1000.0000 mL | Freq: Once | INTRAVENOUS | Status: DC

## 2019-07-20 MED ORDER — BACLOFEN 10 MG PO TABS
5.0000 mg | ORAL_TABLET | Freq: Two times a day (BID) | ORAL | Status: DC
  Administered 2019-07-20 – 2019-07-21 (×3): 5 mg via ORAL
  Filled 2019-07-20 (×3): qty 1

## 2019-07-20 NOTE — Progress Notes (Signed)
A consult was placed to IV Therapy earlier this morning; RN was requesting more access due to multiple iv medications;  Pt has a DL picc line;  He is well known to the IV Team and is an extremely difficult stick;  IR places his lines;  Spoke w Ebony Hail, RN, who will hold or stagger some of his medications and continue only using the DL Picc line due to access issues.  Thank you.   Raynelle Fanning  RN

## 2019-07-20 NOTE — Progress Notes (Signed)
Received call from patient's sister - [name] [name].  Mrs. Ray is concerned about her brother's increased level of confusion.  Bedside RN explained her brother's confusion increased throughout the day.  Patient was alert and able to tell this nurse writer exactly what he wanted for breakfast yesterday.  Mrs. Ray inquired about visitation and was told current visitation hours.  Nurse writer also explained only one person can visit the patient and it must be the same person throughout the hospitalization stay.    Mrs. Ray recently lost her husband in February 2021.  She is very concerned about losing her brother so soon after losing her husband.
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
## 2019-07-20 NOTE — Progress Notes (Signed)
Pt. currently being worked with by staff.

## 2019-07-20 NOTE — Progress Notes (Signed)
RT attempted CPAP placement after pt. agreed to have placed on, after X5 minutes pt. wanted CPAP off and RT placed back on 2 lpm n/c, RN made aware.

## 2019-07-20 NOTE — Progress Notes (Signed)
Pt refuses CPAP QHS.  RT to monitor and assess as needed.  

## 2019-07-21 ENCOUNTER — Inpatient Hospital Stay (HOSPITAL_COMMUNITY): Payer: Medicare Other

## 2019-07-21 ENCOUNTER — Encounter (HOSPITAL_COMMUNITY): Admission: EM | Disposition: E | Payer: Self-pay | Source: Home / Self Care | Attending: Internal Medicine

## 2019-07-21 ENCOUNTER — Encounter (HOSPITAL_COMMUNITY): Payer: Self-pay | Admitting: Internal Medicine

## 2019-07-21 ENCOUNTER — Inpatient Hospital Stay (HOSPITAL_COMMUNITY): Payer: Medicare Other | Admitting: Registered Nurse

## 2019-07-21 DIAGNOSIS — Z980 Intestinal bypass and anastomosis status: Secondary | ICD-10-CM

## 2019-07-21 DIAGNOSIS — M726 Necrotizing fasciitis: Secondary | ICD-10-CM

## 2019-07-21 HISTORY — PX: INCISION AND DRAINAGE ABSCESS: SHX5864

## 2019-07-21 LAB — BLOOD GAS, ARTERIAL
Acid-base deficit: 7.2 mmol/L — ABNORMAL HIGH (ref 0.0–2.0)
Bicarbonate: 17.6 mmol/L — ABNORMAL LOW (ref 20.0–28.0)
FIO2: 50
MECHVT: 620 mL
O2 Saturation: 96.5 %
Patient temperature: 97.4
RATE: 18 resp/min
pCO2 arterial: 33.9 mmHg (ref 32.0–48.0)
pH, Arterial: 7.331 — ABNORMAL LOW (ref 7.350–7.450)
pO2, Arterial: 122 mmHg — ABNORMAL HIGH (ref 83.0–108.0)

## 2019-07-21 LAB — CBC
HCT: 19.8 % — ABNORMAL LOW (ref 39.0–52.0)
HCT: 33.4 % — ABNORMAL LOW (ref 39.0–52.0)
Hemoglobin: 6.1 g/dL — CL (ref 13.0–17.0)
Hemoglobin: 10.6 g/dL — ABNORMAL LOW (ref 13.0–17.0)
MCH: 24.2 pg — ABNORMAL LOW (ref 26.0–34.0)
MCH: 26.2 pg (ref 26.0–34.0)
MCHC: 30.8 g/dL (ref 30.0–36.0)
MCHC: 31.7 g/dL (ref 30.0–36.0)
MCV: 78.6 fL — ABNORMAL LOW (ref 80.0–100.0)
MCV: 82.5 fL (ref 80.0–100.0)
Platelets: 49 10*3/uL — ABNORMAL LOW (ref 150–400)
Platelets: 28 10*3/uL — CL (ref 150–400)
RBC: 2.52 MIL/uL — ABNORMAL LOW (ref 4.22–5.81)
RBC: 4.05 MIL/uL — ABNORMAL LOW (ref 4.22–5.81)
RDW: 18.3 % — ABNORMAL HIGH (ref 11.5–15.5)
RDW: 17.2 % — ABNORMAL HIGH (ref 11.5–15.5)
WBC: 14.3 10*3/uL — ABNORMAL HIGH (ref 4.0–10.5)
WBC: 15.8 10*3/uL — ABNORMAL HIGH (ref 4.0–10.5)
nRBC: 0 % (ref 0.0–0.2)
nRBC: 0 % (ref 0.0–0.2)

## 2019-07-21 LAB — BASIC METABOLIC PANEL
Anion gap: 8 (ref 5–15)
Anion gap: 6 (ref 5–15)
BUN: 20 mg/dL (ref 6–20)
BUN: 18 mg/dL (ref 6–20)
CO2: 17 mmol/L — ABNORMAL LOW (ref 22–32)
CO2: 18 mmol/L — ABNORMAL LOW (ref 22–32)
Calcium: 7.3 mg/dL — ABNORMAL LOW (ref 8.9–10.3)
Calcium: 6.6 mg/dL — ABNORMAL LOW (ref 8.9–10.3)
Chloride: 115 mmol/L — ABNORMAL HIGH (ref 98–111)
Chloride: 117 mmol/L — ABNORMAL HIGH (ref 98–111)
Creatinine, Ser: 0.31 mg/dL — ABNORMAL LOW (ref 0.61–1.24)
Creatinine, Ser: <0.3 mg/dL — ABNORMAL LOW (ref 0.61–1.24)
GFR calc Af Amer: >60 mL/min (ref >60)
GFR calc non Af Amer: >60 mL/min (ref >60)
Glucose, Bld: 118 mg/dL — ABNORMAL HIGH (ref 70–99)
Glucose, Bld: 115 mg/dL — ABNORMAL HIGH (ref 70–99)
Potassium: 3.1 mmol/L — ABNORMAL LOW (ref 3.5–5.1)
Potassium: 1.5 mmol/L — CL (ref 3.5–5.1)
Sodium: 140 mmol/L (ref 135–145)
Sodium: 141 mmol/L (ref 135–145)

## 2019-07-21 LAB — PREPARE RBC (CROSSMATCH)

## 2019-07-21 LAB — GLUCOSE, CAPILLARY
Glucose-Capillary: 103 mg/dL — ABNORMAL HIGH (ref 70–99)
Glucose-Capillary: 109 mg/dL — ABNORMAL HIGH (ref 70–99)
Glucose-Capillary: 110 mg/dL — ABNORMAL HIGH (ref 70–99)
Glucose-Capillary: 111 mg/dL — ABNORMAL HIGH (ref 70–99)
Glucose-Capillary: 155 mg/dL — ABNORMAL HIGH (ref 70–99)

## 2019-07-21 LAB — POCT I-STAT, CHEM 8
BUN: 16 mg/dL (ref 6–20)
Calcium, Ion: 1.14 mmol/L — ABNORMAL LOW (ref 1.15–1.40)
Chloride: 109 mmol/L (ref 98–111)
Creatinine, Ser: 0.3 mg/dL — ABNORMAL LOW (ref 0.61–1.24)
Glucose, Bld: 124 mg/dL — ABNORMAL HIGH (ref 70–99)
HCT: 25 % — ABNORMAL LOW (ref 39.0–52.0)
Hemoglobin: 8.5 g/dL — ABNORMAL LOW (ref 13.0–17.0)
Potassium: 2.1 mmol/L — CL (ref 3.5–5.1)
Sodium: 142 mmol/L (ref 135–145)
TCO2: 21 mmol/L — ABNORMAL LOW (ref 22–32)

## 2019-07-21 LAB — PROCALCITONIN: Procalcitonin: 3.9 ng/mL

## 2019-07-21 LAB — PHOSPHORUS: Phosphorus: 2 mg/dL — ABNORMAL LOW (ref 2.5–4.6)

## 2019-07-21 LAB — MAGNESIUM
Magnesium: 1.8 mg/dL (ref 1.7–2.4)
Magnesium: 1.6 mg/dL — ABNORMAL LOW (ref 1.7–2.4)

## 2019-07-21 SURGERY — INCISION AND DRAINAGE, ABSCESS
Anesthesia: General | Site: Back

## 2019-07-21 MED ORDER — PHENOL 1.4 % MT LIQD
2.0000 | OROMUCOSAL | Status: DC | PRN
  Filled 2019-07-21: qty 177

## 2019-07-21 MED ORDER — CLINDAMYCIN PHOSPHATE 900 MG/50ML IV SOLN: 900.0000 mg | INTRAVENOUS | Status: DC

## 2019-07-21 MED ORDER — DIPHENHYDRAMINE HCL 50 MG/ML IJ SOLN: 12.5000 mg | Freq: Four times a day (QID) | INTRAMUSCULAR | Status: DC | PRN

## 2019-07-21 MED ORDER — SODIUM CHLORIDE 0.9 % IV SOLN: INTRAVENOUS | Status: DC | PRN

## 2019-07-21 MED ORDER — SODIUM CHLORIDE (PF) 0.9 % IJ SOLN
INTRAMUSCULAR | Status: AC
  Filled 2019-07-21: qty 50

## 2019-07-21 MED ORDER — ORAL CARE MOUTH RINSE
15.0000 mL | OROMUCOSAL | Status: DC
  Administered 2019-07-22 – 2019-07-27 (×36): 15 mL via OROMUCOSAL

## 2019-07-21 MED ORDER — PROPOFOL 10 MG/ML IV BOLUS
INTRAVENOUS | Status: AC
  Filled 2019-07-21: qty 20

## 2019-07-21 MED ORDER — MIDAZOLAM HCL 2 MG/2ML IJ SOLN
1.0000 mg | INTRAMUSCULAR | Status: DC | PRN
  Administered 2019-07-28: 09:00:00 1 mg via INTRAVENOUS
  Filled 2019-07-21: qty 2

## 2019-07-21 MED ORDER — FENTANYL 2500MCG IN NS 250ML (10MCG/ML) PREMIX INFUSION
0.0000 ug/h | INTRAVENOUS | Status: DC
  Administered 2019-07-21: 18:00:00 25 ug/h via INTRAVENOUS
  Administered 2019-07-22: 100 ug/h via INTRAVENOUS
  Administered 2019-07-24: 75 ug/h via INTRAVENOUS
  Filled 2019-07-21 (×3): qty 250

## 2019-07-21 MED ORDER — PROPOFOL 500 MG/50ML IV EMUL
INTRAVENOUS | Status: AC
  Filled 2019-07-21: qty 50

## 2019-07-21 MED ORDER — CHLORHEXIDINE GLUCONATE 0.12% ORAL RINSE (MEDLINE KIT)
15.0000 mL | Freq: Two times a day (BID) | OROMUCOSAL | Status: DC
  Administered 2019-07-22 – 2019-07-28 (×13): 15 mL via OROMUCOSAL

## 2019-07-21 MED ORDER — VASOPRESSIN 20 UNIT/ML IV SOLN
INTRAVENOUS | Status: DC | PRN
  Administered 2019-07-21: 1 [IU] via INTRAVENOUS

## 2019-07-21 MED ORDER — SODIUM CHLORIDE 0.9 % IV SOLN: 10.0000 mL/h | Freq: Once | INTRAVENOUS | Status: DC

## 2019-07-21 MED ORDER — MENTHOL 3 MG MT LOZG: 1.0000 | LOZENGE | OROMUCOSAL | Status: DC | PRN

## 2019-07-21 MED ORDER — 0.9 % SODIUM CHLORIDE (POUR BTL) OPTIME
TOPICAL | Status: DC | PRN
  Administered 2019-07-21: 1000 mL

## 2019-07-21 MED ORDER — VANCOMYCIN HCL 1250 MG/250ML IV SOLN
1250.0000 mg | Freq: Two times a day (BID) | INTRAVENOUS | Status: DC
  Administered 2019-07-22: 1250 mg via INTRAVENOUS
  Filled 2019-07-21 (×2): qty 250

## 2019-07-21 MED ORDER — IOHEXOL 9 MG/ML PO SOLN
ORAL | Status: AC
  Administered 2019-07-21: 500 mL via ORAL
  Filled 2019-07-21: qty 1000

## 2019-07-21 MED ORDER — POTASSIUM CHLORIDE CRYS ER 20 MEQ PO TBCR
30.0000 meq | EXTENDED_RELEASE_TABLET | ORAL | Status: AC
  Administered 2019-07-21 (×2): 30 meq via ORAL
  Filled 2019-07-21 (×2): qty 1

## 2019-07-21 MED ORDER — POTASSIUM CHLORIDE 10 MEQ/50ML IV SOLN
10.0000 meq | INTRAVENOUS | Status: AC
  Administered 2019-07-21 – 2019-07-22 (×6): 10 meq via INTRAVENOUS
  Filled 2019-07-21 (×6): qty 50

## 2019-07-21 MED ORDER — ETOMIDATE 2 MG/ML IV SOLN
INTRAVENOUS | Status: DC | PRN
  Administered 2019-07-21: 12 mg via INTRAVENOUS

## 2019-07-21 MED ORDER — VANCOMYCIN HCL 1500 MG/300ML IV SOLN
1500.0000 mg | Freq: Once | INTRAVENOUS | Status: AC
  Administered 2019-07-21: 1500 mg via INTRAVENOUS
  Filled 2019-07-21 (×2): qty 300

## 2019-07-21 MED ORDER — FENTANYL BOLUS VIA INFUSION
50.0000 ug | INTRAVENOUS | Status: DC | PRN
  Administered 2019-07-29: 50 ug via INTRAVENOUS
  Filled 2019-07-21: qty 50

## 2019-07-21 MED ORDER — LACTATED RINGERS IV SOLN: INTRAVENOUS | Status: DC | PRN

## 2019-07-21 MED ORDER — LIP MEDEX EX OINT
1.0000 "application " | TOPICAL_OINTMENT | Freq: Two times a day (BID) | CUTANEOUS | Status: DC
  Administered 2019-07-21 – 2019-08-01 (×23): 1 via TOPICAL
  Filled 2019-07-21: qty 7

## 2019-07-21 MED ORDER — SODIUM CHLORIDE 0.9% IV SOLUTION: Freq: Once | INTRAVENOUS | Status: AC

## 2019-07-21 MED ORDER — PIPERACILLIN-TAZOBACTAM 3.375 G IVPB
3.3750 g | Freq: Three times a day (TID) | INTRAVENOUS | Status: DC
  Administered 2019-07-21 – 2019-08-01 (×34): 3.375 g via INTRAVENOUS
  Filled 2019-07-21 (×34): qty 50

## 2019-07-21 MED ORDER — PHENYLEPHRINE HCL-NACL 10-0.9 MG/250ML-% IV SOLN
INTRAVENOUS | Status: AC
  Filled 2019-07-21: qty 250

## 2019-07-21 MED ORDER — ONDANSETRON HCL 4 MG/2ML IJ SOLN
INTRAMUSCULAR | Status: AC
  Filled 2019-07-21: qty 2

## 2019-07-21 MED ORDER — FENTANYL CITRATE (PF) 100 MCG/2ML IJ SOLN
INTRAMUSCULAR | Status: AC
  Filled 2019-07-21: qty 2

## 2019-07-21 MED ORDER — VANCOMYCIN HCL IN DEXTROSE 1-5 GM/200ML-% IV SOLN: 1000.0000 mg | Freq: Once | INTRAVENOUS | Status: DC

## 2019-07-21 MED ORDER — PROCHLORPERAZINE EDISYLATE 10 MG/2ML IJ SOLN: 5.0000 mg | INTRAMUSCULAR | Status: DC | PRN

## 2019-07-21 MED ORDER — IOHEXOL 9 MG/ML PO SOLN
500.0000 mL | ORAL | Status: AC
  Administered 2019-07-21: 500 mL via ORAL

## 2019-07-21 MED ORDER — VASOPRESSIN 20 UNIT/ML IV SOLN
INTRAVENOUS | Status: AC
  Filled 2019-07-21: qty 1

## 2019-07-21 MED ORDER — GENTAMICIN SULFATE 40 MG/ML IJ SOLN: 5.0000 mg/kg | INTRAVENOUS | Status: DC

## 2019-07-21 MED ORDER — IOHEXOL 300 MG/ML  SOLN
100.0000 mL | Freq: Once | INTRAMUSCULAR | Status: AC | PRN
  Administered 2019-07-21: 11:00:00 100 mL via INTRAVENOUS

## 2019-07-21 MED ORDER — ROCURONIUM BROMIDE 10 MG/ML (PF) SYRINGE
PREFILLED_SYRINGE | INTRAVENOUS | Status: DC | PRN
  Administered 2019-07-21: 100 mg via INTRAVENOUS

## 2019-07-21 MED ORDER — METHOCARBAMOL 1000 MG/10ML IJ SOLN
1000.0000 mg | Freq: Four times a day (QID) | INTRAVENOUS | Status: DC | PRN
  Filled 2019-07-21: qty 10

## 2019-07-21 MED ORDER — MIDAZOLAM HCL 2 MG/2ML IJ SOLN
INTRAMUSCULAR | Status: AC
  Filled 2019-07-21: qty 2

## 2019-07-21 MED ORDER — MIDAZOLAM HCL 2 MG/2ML IJ SOLN: 1.0000 mg | INTRAMUSCULAR | Status: DC

## 2019-07-21 MED ORDER — CHLORHEXIDINE GLUCONATE CLOTH 2 % EX PADS: 6.0000 | MEDICATED_PAD | Freq: Once | CUTANEOUS | Status: DC

## 2019-07-21 MED ORDER — MIDAZOLAM HCL 5 MG/5ML IJ SOLN
INTRAMUSCULAR | Status: DC | PRN
  Administered 2019-07-21 (×2): .5 mg via INTRAVENOUS

## 2019-07-21 SURGICAL SUPPLY — 33 items
BLADE SURG 15 STRL LF DISP TIS (BLADE) ×1 IMPLANT
BLADE SURG 15 STRL SS (BLADE) ×2
BRIEF STRETCH FOR OB PAD LRG (UNDERPADS AND DIAPERS) ×1 IMPLANT
COVER SURGICAL LIGHT HANDLE (MISCELLANEOUS) ×2 IMPLANT
COVER WAND RF STERILE (DRAPES) IMPLANT
DRAPE LAPAROTOMY T 102X78X121 (DRAPES) ×2 IMPLANT
DRSG PAD ABDOMINAL 8X10 ST (GAUZE/BANDAGES/DRESSINGS) ×2 IMPLANT
ELECT REM PT RETURN 15FT ADLT (MISCELLANEOUS) ×2 IMPLANT
GAUZE 4X4 16PLY RFD (DISPOSABLE) ×3 IMPLANT
GAUZE SPONGE 4X4 12PLY STRL (GAUZE/BANDAGES/DRESSINGS) ×4 IMPLANT
GLOVE ECLIPSE 8.0 STRL XLNG CF (GLOVE) ×2 IMPLANT
GLOVE INDICATOR 8.0 STRL GRN (GLOVE) ×2 IMPLANT
GOWN STRL REUS W/TWL XL LVL3 (GOWN DISPOSABLE) ×4 IMPLANT
KIT BASIN OR (CUSTOM PROCEDURE TRAY) ×2 IMPLANT
KIT TURNOVER KIT A (KITS) IMPLANT
NEEDLE HYPO 22GX1.5 SAFETY (NEEDLE) ×1 IMPLANT
PACK BASIC VI WITH GOWN DISP (CUSTOM PROCEDURE TRAY) ×2 IMPLANT
PENCIL SMOKE EVACUATOR (MISCELLANEOUS) IMPLANT
SUCTION FRAZIER HANDLE 12FR (TUBING)
SUCTION TUBE FRAZIER 12FR DISP (TUBING) IMPLANT
SURGILUBE 2OZ TUBE FLIPTOP (MISCELLANEOUS) ×1 IMPLANT
SUT CHROMIC 2 0 SH (SUTURE) IMPLANT
SUT CHROMIC 3 0 SH 27 (SUTURE) IMPLANT
SUT VIC AB 2-0 UR6 27 (SUTURE) IMPLANT
SWAB COLLECTION DEVICE MRSA (MISCELLANEOUS) ×1 IMPLANT
SWAB CULTURE ESWAB REG 1ML (MISCELLANEOUS) ×1 IMPLANT
SYR 20ML LL LF (SYRINGE) ×2 IMPLANT
SYR 3ML LL SCALE MARK (SYRINGE) IMPLANT
SYR BULB IRRIGATION 50ML (SYRINGE) IMPLANT
TAPE CLOTH SURG 4X10 WHT LF (GAUZE/BANDAGES/DRESSINGS) ×1 IMPLANT
TOWEL OR 17X26 10 PK STRL BLUE (TOWEL DISPOSABLE) ×2 IMPLANT
TOWEL OR NON WOVEN STRL DISP B (DISPOSABLE) ×2 IMPLANT
YANKAUER SUCT BULB TIP 10FT TU (MISCELLANEOUS) ×2 IMPLANT
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
## 2019-07-21 NOTE — Progress Notes (Addendum)
Pharmacy Antibiotic Note  [name] [name] is a 54 y.o. male admitted on 07/30/2019 with sepsis.  Pharmacy has been consulted for vancomycin dosing addition to cefepime started 3/3 08/07/2019 Abx D#5, Cefepime D#4;  to add back vancomycin now for osteomyelitis/necrotizing fasciitis CCS changed cefepime to zosyn . WBC down to 14.3, SCr remains low. PCT up to 3.9  3/7 CT: Chronic areas of osteomyelitis, bilateral septic arthritis in the hip joints, deep decubitus ulcers overlying the sacrum and right hip joint, and new deep soft tissue ulcers in the right lateral abdominal wall and right flank, with new soft tissue gas extending in the subcutaneous soft tissues of the right flank and paraspinal regions, as detailed above. Findings are highly concerning for developing necrotizing fasciitis in these regions.  Plan: Vanc 1500mg  IV x 1 then 1250mg  IV q12 - goal AUC 400-550 Expected AUC: 474.4  34.4/11/7 SCr used: 0.8 Cefepime changed to zosyn 3.375 gm IV q8h, infuse each dose over 4 hours F/u renal fxn, WBC, temp, culture data Vancomycin levels as needed F/u SCr on V/Z combination   Height: 6' (182.9 cm) Weight: 157 lb (71.2 kg) IBW/kg (Calculated) : 77.6  Temp (24hrs), Avg:98.2 F (36.8 C), Min:97.6 F (36.4 C), Max:99.2 F (37.3 C)  Recent Labs  Lab 07/24/2019 1907 07/30/2019 1926 07/18/19 0939 07/19/19 0437 07/20/19 0310 08/11/2019 0247 07/31/2019 0400  WBC  --  16.3* 24.4* 21.6* 21.2*  --  14.3*  CREATININE  --  <0.30* 0.31* <0.30* <0.30* 0.31*  --   LATICACIDVEN 1.6 1.7  --   --   --   --   --     Estimated Creatinine Clearance: 108.8 mL/min (A) (by C-G formula based on SCr of 0.31 mg/dL (L)).    Allergies  Allergen Reactions  . Ferumoxytol Anaphylaxis and Other (See Comments)    (Feraheme) = "SYNCOPE; patient tolerated Venofer 10/01/18 s rxn"   . Oxybutynin Other (See Comments)    Hallucinations    . Vancomycin Other (See Comments)    ARF 05-2016 -- affects kidneys     Antimicrobials this admission:  3/3 Cefepime >>3/7 3/7 zosyn>> 3/4 vanc >> 3/5  3/7>> 3/2 flagyl x 1  Microbiology results:  3/3 BCx: ngtd 3/3 UCx: mult sp F 3/4 MRSA PCR: positive  Thank you for allowing pharmacy to be a part of this patient's care.  Eudelia Bunch, Pharm.D [phone] 07/18/2019 12:45 PM

## 2019-07-21 NOTE — Progress Notes (Signed)
[facility] Progress Note Patient Name: [name] [name] DOB: September 10, 1965 MRN: [id number]   Date of Service  07/30/2019  HPI/Events of Note  Patient s/p debridement of necrotizing fascitis today. Platelets = 28K. Actively oozing from surgical site.   eICU Interventions  Will order: 1. Transfuse  1 unit single donor platelets now.  2. CBC with platelets at 5 AM.     Intervention Category Major Interventions: Other:  Lysle Dingwall 07/20/2019, 9:05 PM

## 2019-07-21 NOTE — Progress Notes (Signed)
RT SECURED THE ET TUBE WITH A COMMERCIAL TUBE HOLDER 8@24 . NURSE CALLED AND STATED THAT DR. Tamala Julian WANTED THE ET TUBE AT 21. RT NOTICE THAT THE ET TUBE WAS AT 27 WHEN SHE WAS GETTING READY TO ADJUST THE ET TUBE.Marland Kitchen RT PULLED THE ET TUBE TO 23CM AND ASKED THE NURSE TO GET ANOTHER XRAY FOR TUBE PLACEMENT.

## 2019-07-21 NOTE — Progress Notes (Signed)
[facility] Progress Note Patient Name: [name] [name] DOB: 03-07-1966 MRN: [id number]   Date of Service  07/22/2019  HPI/Events of Note  K+ = 1.5 and Creatinine = 0.30.  eICU Interventions  Will order: 1. Replace K+.  2. Repeat BMP at 5 AM.      Intervention Category Major Interventions: Electrolyte abnormality - evaluation and management  Lysle Dingwall 08/13/2019, 8:44 PM

## 2019-07-21 NOTE — Op Note (Addendum)
08/06/2019  4:15 PM  PATIENT:  [name] [name]  54 y.o. male  Patient Care Team: Gerald Leitz as PCP - General (Physician Assistant) Dillingham, Loel Lofty, DO as Attending Physician (Plastic Surgery) Nahser, Wonda Cheng, MD as Consulting Physician (Cardiology) Clarene Essex, MD as Consulting Physician (Gastroenterology)  PRE-OPERATIVE DIAGNOSIS:   Chronic decubitus ulcers sacrum, ischial tuberosities, perineum, posterior back scapula and flanks Necrotizing fascitis  POST-OPERATIVE DIAGNOSIS: Chronic decubitus ulcers sacrum, ischial tuberosities, perineum, posterior back scapula and flanks Necrotizing fascitis of the lower central back, right chest wall and right flank pending to right anterolateral abdominal wall   PROCEDURE:  Excision and extensive debridement of soft tissues & muscle of lower back, right chest wall, right flank, right anterior abdominal wall  Excision of left lower back chronic decubitus  SURGEON:  Adin Hector, MD  ASSISTANT: OR Staff   ANESTHESIA:   general  EBL:  Total I/O In: [id number] [P.O.:50; I.V.:414.9; Blood:775; IV Piggyback:144.1] Out: [id number] [Urine:550; Emesis/NG output:1100]  Delay start of Pharmacological VTE agent (>24hrs) due to surgical blood loss or risk of bleeding:  no  DRAINS: none   SPECIMEN:  Right lateral black eschar necrotizing fasciitis Left inferolateral eschar decutitus   DISPOSITION OF SPECIMEN: Aerobic/anaerobic culture.  Pathology  COUNTS:  YES  PLAN OF CARE: Admit to inpatient   PATIENT DISPOSITION:  ICU - intubated and critically ill.  INDICATION: Patient with C5 quadriplegia with chronic bedridden state since 1988.  Numerous decubiti managed by plastic reconstructive surgery with numerous prior debridements.  Already has fecal and urinary division.  Came in with decreased appetite and septic shock 4 days ago.  Felt initially urosepsis but had worsening shock.  CT scan reveals gas tracking much of his central  back and right flank.  Surgical consultation made.  I recommended operative debridement.  The anatomy and physiology of skin abscesses was discussed. Pathophysiology of SQ abscess, possible progression to fasciitis & sepsis, etc discussed . I stressed good hygiene & wound care. Possible redebridement was discussed as well.   Possibility of recurrence was discussed. Risks, benefits, alternatives were discussed. I noted a good likelihood this will help address the problem. Risks of anesthesia and other risks discussed. Questions answered. The patient is does wish to proceed.   OR FINDINGS:   PREOP PICTURES:   Prone view (pt chronic twisted rightwards) 30 x 25 cm region of sacral and ischial tuberosity chronic decubiti and perineal wounds - no active fasciitis Left posterior back of chronic dry eschar excised with resulting 7 x 4 cm healthy wound. Isthmus of healthy skin going to anterior perianal region just anterior to anus    Prone view: Chronic pressure decubitus at right posterior shoulder scapula 7 x 3 cm to dermis Right chest wall 14 x 7 cm deep dermis and SQ  6 x 5 cm dry eschar epicenter of wet gangrene and necrotizing fasciitis involving most of the hyperpigmented skin seen above, connecting with right posterior back deep stellate wound going down to posterior back fascia 8 x 5 x 6 cm region.  Aggressive sharp debridement with scissors and occasionally scalpel off of dermis, subcutaneous fat, posterior back musculature.  Wound connected from left mid back to right chest wall.  Deep wound 35 x 15 cm.       Right lateral view chronic decubiti with patient in prone position.  Right lateral flank 7 x 6 x 6 cm deep wound going down to fascia connecting with severe necrotizing fasciitis.  Crepitus  tracking along right panniculus to anterior abdominal wall.  23 x 17 cm region   DESCRIPTION:   Informed consent was confirmed. The patient received IV antibiotics.  Additional central line  placed by Dr. Ola Spurr with anesthesia.  The patient underwent general anesthesia without any difficulty. The patient was positioned prone very carefully given his chronic rigidity/spasticity.  SCDs were active during the entire case. The area around the abscess was prepped and draped in a sterile fashion. A surgical timeout confirmed our plan.   I excised the right lateral back chronic as sharp where the central region of crepitus was found.  I immediately encountered foul gray necrotic dermis fat and muscle.  I could extend my fingers across the midline towards the left back.  Also can extend up to the right lateral chest wall.  Could extend the right flank anteriorly.  I extended that wound laterally and medially.  17 x 6 cm.  I used sharp dissection with Mayo scissors and occasional scalpel to debride all obvious necrotic tissue until I got to healthy bleeding tissue.  The wound tracked towards the right chest wall chronic decubitus but not connected with it.  It headed towards the left lower back chronic S char in the left upper lateral corner of his giant perineal/sacral decubitus wounds.  However there was 6 cm that was healthy and spared.  I made a counterincision on the left lateral posterior back x7 cm.  Came through healthy dermis skin and healthy fascia.  It seemed left lateral most extent of the fasciitis.  The right flank decubitus wound had foul order in.  I was able to connect that wound up towards the large right lateral back wound that I created to debride.  I can easily extend my finger around the right lateral abdominal wall deep to the panniculus.  However tissue seemed viable going towards the right groin and anterior abdominal wall.  I excised the left lateral lower back chronic eschar of the giant perineal wounds.  Got healthy bleeding tissue.  I reinspected the wounds I did aggressive redebridement until I got healthy bleeding tissue on the deeper back fascia.  No bone exposed but  coming down to the ribs and the erector spinae fascia.  Did irrigation.  Packed the wound.  Used large 4-1/2 inch Kerlix rolls with saline and a little chlorhexidine.  I took 3 rolls to pack all wounds.  1 mid back.  1 right backslash chest wall, 1 right flank/anterior abdominal wall.  Another Kerlix was gently rolled up over the giant perineal and sacral decubitus wounds.  Dry ABD pads were done.  Patient is being kept intubated and will go back to intensive care unit.  He is still in severe septic shock.  He was already acutely anemic on top of his chronic anemia and received 1 unit of blood during surgery.  He may need more.  Defer to critical care.  Most likely patient would benefit from reoperation in the next 24-48 hours to make sure he does not have progression of his necrotizing fasciitis.  I discussed operative findings, updated the patient's status, discussed probable steps to recovery, and gave postoperative recommendations to the patient's sister, [name]..  Recommendations were made.  Questions were answered.  She expressed understanding & appreciation.    Adin Hector, M.D., F.A.C.S. Gastrointestinal and Minimally Invasive Surgery [facility] [address], [place], [place] [address] [phone] Main / Paging

## 2019-07-21 NOTE — Anesthesia Postprocedure Evaluation (Signed)
Anesthesia Post Note  Patient: [name] [name]  Procedure(s) Performed: INCISION AND Debridement necritizing fascaititis back,flank, and hip  (N/A Back)     Patient location during evaluation: SICU Anesthesia Type: General Level of consciousness: sedated Pain management: pain level controlled Vital Signs Assessment: post-procedure vital signs reviewed and stable Respiratory status: patient remains intubated per anesthesia plan Cardiovascular status: unstable Postop Assessment: no apparent nausea or vomiting Anesthetic complications: no    Last Vitals:  Vitals:   07/22/2019 1315 08/05/2019 1339  BP: (!) 86/62 (!) 107/55  Pulse: 98 93  Resp: (!) 25 (!) 21  Temp: 36.9 C 37.1 C  SpO2: 100% 99%    Last Pain:  Vitals:   07/31/2019 1339  TempSrc: Oral  PainSc:                  [name] [name]

## 2019-07-21 NOTE — Progress Notes (Signed)
RT will hold CPT at this time due to wounds oozing blood on back due to debridement today.  RT to monitor and assess as needed.

## 2019-07-21 NOTE — Anesthesia Procedure Notes (Signed)
Central Venous Catheter Insertion Performed by: Roderic Palau, MD, anesthesiologist Start/End03/09/2019 2:10 PM, 07/21/2019 2:30 PM Patient location: Pre-op. Preanesthetic checklist: patient identified, IV checked, site marked, risks and benefits discussed, surgical consent, monitors and equipment checked, pre-op evaluation, timeout performed and anesthesia consent Position: Trendelenburg Lidocaine 1% used for infiltration and patient sedated Hand hygiene performed , maximum sterile barriers used  and Seldinger technique used Catheter size: 8 Fr Total catheter length 16. Central line was placed.Double lumen Procedure performed using ultrasound guided technique. Ultrasound Notes:anatomy identified, needle tip was noted to be adjacent to the nerve/plexus identified, no ultrasound evidence of intravascular and/or intraneural injection and image(s) printed for medical record Attempts: 2 (Attempted RIJ. Unable to pass wire.) Following insertion, dressing applied, line sutured and Biopatch. Post procedure assessment: blood return through all ports  Patient tolerated the procedure well with no immediate complications.

## 2019-07-21 NOTE — Anesthesia Procedure Notes (Signed)
Procedure Name: Intubation Date/Time: 07/19/2019 2:44 PM Performed by: Lissa Morales, CRNA Pre-anesthesia Checklist: Patient identified, Emergency Drugs available, Suction available and Patient being monitored Patient Re-evaluated:Patient Re-evaluated prior to induction Oxygen Delivery Method: Circle system utilized Preoxygenation: Pre-oxygenation with 100% oxygen Induction Type: IV induction and Rapid sequence Laryngoscope Size: Mac, 4 and Glidescope Grade View: Grade III Tube type: Oral Number of attempts: 1 Airway Equipment and Method: Stylet and Oral airway Placement Confirmation: ETT inserted through vocal cords under direct vision,  positive ETCO2 and breath sounds checked- equal and bilateral Secured at: 24 cm Tube secured with: Tape Dental Injury: Teeth and Oropharynx as per pre-operative assessment  Difficulty Due To: Difficulty was unanticipated, Difficult Airway- due to reduced neck mobility, Difficult Airway- due to anterior larynx, Difficult Airway- due to limited oral opening and Difficult Airway- due to dentition Comments: Elective glidescope

## 2019-07-21 NOTE — Anesthesia Preprocedure Evaluation (Addendum)
Anesthesia Evaluation  Patient identified by MRN, date of birth, ID band Patient awake    Reviewed: Allergy & Precautions, H&P , NPO status , Patient's Chart, lab work & pertinent test results  Airway Mallampati: III  TM Distance: >3 FB Neck ROM: Limited    Dental no notable dental hx. (+) Teeth Intact   Pulmonary sleep apnea ,    Pulmonary exam normal breath sounds clear to auscultation       Cardiovascular hypertension, + Peripheral Vascular Disease   Rhythm:Regular Rate:Normal     Neuro/Psych Depression Quadraplegia negative neurological ROS     GI/Hepatic Neg liver ROS, GERD  ,  Endo/Other  negative endocrine ROS  Renal/GU negative Renal ROS  negative genitourinary   Musculoskeletal   Abdominal   Peds  Hematology  (+) Blood dyscrasia, anemia ,   Anesthesia Other Findings   Reproductive/Obstetrics negative OB ROS                            Anesthesia Physical Anesthesia Plan  ASA: III and emergent  Anesthesia Plan: General   Post-op Pain Management:    Induction: Intravenous, Rapid sequence and Cricoid pressure planned  PONV Risk Score and Plan: 3 and Ondansetron, Dexamethasone and Midazolam  Airway Management Planned: Oral ETT and Video Laryngoscope Planned  Additional Equipment: Arterial line, CVP and Ultrasound Guidance Line Placement  Intra-op Plan:   Post-operative Plan: Possible Post-op intubation/ventilation  Informed Consent: I have reviewed the patients History and Physical, chart, labs and discussed the procedure including the risks, benefits and alternatives for the proposed anesthesia with the patient or authorized representative who has indicated his/her understanding and acceptance.     Dental advisory given  Plan Discussed with: CRNA  Anesthesia Plan Comments:        Anesthesia Quick Evaluation

## 2019-07-21 NOTE — Progress Notes (Signed)
CRITICAL VALUE ALERT  Critical Value:  Hgb 6.1  Date & Time Notied:  07/20/2019 0845  Provider Notified: Tamala Julian, MD  Orders Received/Actions taken: Transfuse 1 unit PRBC

## 2019-07-21 NOTE — Anesthesia Procedure Notes (Signed)
Arterial Line Insertion Start/End03/10/2019 3:10 PM, 07/21/2019 3:15 PM Performed by: Roderic Palau, MD  Patient location: OR. Preanesthetic checklist: patient identified, IV checked, site marked, risks and benefits discussed, surgical consent, monitors and equipment checked, pre-op evaluation, timeout performed and anesthesia consent Lidocaine 1% used for infiltration Left, radial was placed Catheter size: 20 Fr Hand hygiene performed , maximum sterile barriers used  and Seldinger technique used  Attempts: 1 Procedure performed without using ultrasound guided technique. Following insertion, dressing applied and Biopatch. Post procedure assessment: normal and unchanged  Patient tolerated the procedure well with no immediate complications.

## 2019-07-21 NOTE — Consult Note (Addendum)
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 [id number]    HCT 25.5 (L) 39.0 - 52.0 %   MCV 75.7 (L) 80.0 - 100.0 fL   MCH 24.6 (L) 26.0 - 34.0 pg   MCHC 32.5 30.0 - 36.0 g/dL   RDW 18.0 (H) 11.5 - 15.5 %   Platelets 71 (L) 150 - 400 K/uL    Comment: Immature Platelet Fraction may be clinically indicated, consider ordering this additional test [id number]    nRBC 0.0 0.0 - 0.2 %    Comment: Performed at [facility], [place] [address], [place], [place] [address]  Basic metabolic panel     Status: Abnormal   Collection Time: 07/20/19  3:10 AM  Result Value Ref Range   Sodium 139 135 - 145 mmol/L   Potassium 3.9 3.5 - 5.1 mmol/L   Chloride 119 (H) 98 - 111 mmol/L   CO2 14 (L) 22 - 32 mmol/L   Glucose, Bld 98 70 - 99 mg/dL    Comment: Glucose reference range applies only to samples taken after fasting for at least 8 hours.   BUN 21 (H) 6 - 20 mg/dL    Creatinine, Ser <0.30 (L) 0.61 - 1.24 mg/dL   Calcium 7.2 (L) 8.9 - 10.3 mg/dL   GFR calc non Af Amer NOT CALCULATED >60 mL/min   GFR calc Af Amer NOT CALCULATED >60 mL/min   Anion gap 6 5 - 15    Comment: Performed at [facility], [place] [address] [place], [place] [address]  Magnesium     Status: None   Collection Time: 07/20/19  3:10 AM  Result Value Ref Range   Magnesium 1.9 1.7 - 2.4 mg/dL    Comment: Performed at [facility], [place] [address], [place], [place] [address]  Phosphorus     Status: Abnormal   Collection Time: 07/20/19  3:10  AM  Result Value Ref Range   Phosphorus 1.5 (L) 2.5 - 4.6 mg/dL    Comment: Performed at [facility], [place] [address] [place], [place] [address]  Glucose, capillary     Status: None   Collection Time: 07/20/19  3:25 AM  Result Value Ref Range   Glucose-Capillary 87 70 - 99 mg/dL    Comment: Glucose reference range applies only to samples taken after fasting for at least 8 hours.   Comment 1 Notify RN    Comment 2 Document in Chart   Glucose, capillary     Status: None   Collection Time: 07/20/19  8:00 AM  Result Value Ref Range   Glucose-Capillary 89 70 - 99 mg/dL    Comment: Glucose reference range applies only to samples taken after fasting for at least 8 hours.  Procalcitonin - Baseline     Status: None   Collection Time: 07/20/19  8:23 AM  Result Value Ref Range   Procalcitonin 3.13 ng/mL    Comment:        Interpretation: PCT > 2 ng/mL: Systemic infection (sepsis) is likely, unless other causes are known. (NOTE)       Sepsis PCT Algorithm           Lower Respiratory Tract                                      Infection PCT Algorithm    ----------------------------     ----------------------------         PCT < 0.25 ng/mL                PCT < 0.10 ng/mL         Strongly encourage             Strongly discourage   discontinuation of antibiotics    initiation of antibiotics     ----------------------------     -----------------------------       PCT 0.25 - 0.50 ng/mL            PCT 0.10 - 0.25 ng/mL               OR       >80% decrease in PCT            Discourage initiation of                                            antibiotics      Encourage discontinuation           of antibiotics    ----------------------------     -----------------------------         PCT >= 0.50 ng/mL              PCT 0.26 - 0.50 ng/mL               AND       <80% decrease in PCT              Encourage initiation of  antibiotics       Encourage continuation           of antibiotics    ----------------------------     -----------------------------        PCT >= 0.50 ng/mL                  PCT > 0.50 ng/mL               AND         increase in PCT                  Strongly encourage                                      initiation of antibiotics    Strongly encourage escalation           of antibiotics                                     -----------------------------                                           PCT <= 0.25 ng/mL                                                 OR                                        > 80% decrease in PCT                                     Discontinue / Do not initiate                                             antibiotics Performed at [place] [address] [place], [place] [address]   Glucose, capillary     Status: Abnormal   Collection Time: 07/20/19 12:43 PM  Result Value Ref Range   Glucose-Capillary 135 (H) 70 - 99 mg/dL    Comment: Glucose reference range applies only to samples taken after fasting for at least 8 hours.   Comment 1 Notify RN    Comment 2 Document in Chart   Glucose, capillary     Status: Abnormal   Collection Time: 07/20/19  3:56 PM  Result Value Ref Range   Glucose-Capillary 135 (H) 70 - 99 mg/dL    Comment: Glucose reference range applies only to  samples taken after fasting for at least 8 hours.   Comment 1 Notify RN    Comment 2 Document in Chart   Glucose, capillary     Status: Abnormal   Collection Time: 07/20/19  7:40 PM  Result Value Ref Range   Glucose-Capillary 120 (H) 70 -  99 mg/dL    Comment: Glucose reference range applies only to samples taken after fasting for at least 8 hours.   Comment 1 Notify RN    Comment 2 Document in Chart   Glucose, capillary     Status: Abnormal   Collection Time: 07/20/19 11:57 PM  Result Value Ref Range   Glucose-Capillary 155 (H) 70 - 99 mg/dL    Comment: Glucose reference range applies only to samples taken after fasting for at least 8 hours.   Comment 1 Notify RN    Comment 2 Document in Chart   Basic metabolic panel     Status: Abnormal   Collection Time: 08/04/2019  2:47 AM  Result Value Ref Range   Sodium 140 135 - 145 mmol/L   Potassium 3.1 (L) 3.5 - 5.1 mmol/L    Comment: DELTA CHECK NOTED   Chloride 115 (H) 98 - 111 mmol/L   CO2 17 (L) 22 - 32 mmol/L   Glucose, Bld 118 (H) 70 - 99 mg/dL    Comment: Glucose reference range applies only to samples taken after fasting for at least 8 hours.   BUN 20 6 - 20 mg/dL   Creatinine, Ser 0.31 (L) 0.61 - 1.24 mg/dL   Calcium 7.3 (L) 8.9 - 10.3 mg/dL   GFR calc non Af Amer >60 >60 mL/min   GFR calc Af Amer >60 >60 mL/min   Anion gap 8 5 - 15    Comment: Performed at [facility], [place] [address] [place], [place] [address]  Magnesium     Status: None   Collection Time: 07/29/2019  2:47 AM  Result Value Ref Range   Magnesium 1.8 1.7 - 2.4 mg/dL    Comment: Performed at [facility], [place] [address] [place], [place] [address]  Phosphorus     Status: Abnormal   Collection Time: 07/20/2019  2:47 AM  Result Value Ref Range   Phosphorus 2.0 (L) 2.5 - 4.6 mg/dL    Comment: Performed at [facility], [place] [address], [place], [place] [address]  Procalcitonin     Status: None   Collection  Time: 07/30/2019  2:47 AM  Result Value Ref Range   Procalcitonin 3.90 ng/mL    Comment:        Interpretation: PCT > 2 ng/mL: Systemic infection (sepsis) is likely, unless other causes are known. (NOTE)       Sepsis PCT Algorithm           Lower Respiratory Tract                                      Infection PCT Algorithm    ----------------------------     ----------------------------         PCT < 0.25 ng/mL                PCT < 0.10 ng/mL         Strongly encourage             Strongly discourage   discontinuation of antibiotics    initiation of antibiotics    ----------------------------     -----------------------------       PCT 0.25 - 0.50 ng/mL            PCT 0.10 - 0.25 ng/mL               OR       >  80% decrease in PCT            Discourage initiation of                                            antibiotics      Encourage discontinuation           of antibiotics    ----------------------------     -----------------------------         PCT >= 0.50 ng/mL              PCT 0.26 - 0.50 ng/mL               AND       <80% decrease in PCT              Encourage initiation of                                             antibiotics       Encourage continuation           of antibiotics    ----------------------------     -----------------------------        PCT >= 0.50 ng/mL                  PCT > 0.50 ng/mL               AND         increase in PCT                  Strongly encourage                                      initiation of antibiotics    Strongly encourage escalation           of antibiotics                                     -----------------------------                                           PCT <= 0.25 ng/mL                                                 OR                                        > 80% decrease in PCT                                     Discontinue / Do not initiate  antibiotics Performed at [facility], [place] [name], [place], [place] [address]   Glucose, capillary     Status: Abnormal   Collection Time: 08/12/2019  3:39 AM  Result Value Ref Range   Glucose-Capillary 111 (H) 70 - 99 mg/dL    Comment: Glucose reference range applies only to samples taken after fasting for at least 8 hours.  CBC     Status: Abnormal   Collection Time: 08/09/2019  4:00 AM  Result Value Ref Range   WBC 14.3 (H) 4.0 - 10.5 K/uL   RBC 2.52 (L) 4.22 - 5.81 MIL/uL   Hemoglobin 6.1 (LL) 13.0 - 17.0 g/dL    Comment: REPEATED TO VERIFY DELTA CHECK NOTED THIS CRITICAL RESULT HAS VERIFIED AND BEEN CALLED TO S,[name] BY AISHA MOHAMED ON 03 07 2021 AT 0840, AND HAS BEEN READ BACK.     HCT 19.8 (L) 39.0 - 52.0 %   MCV 78.6 (L) 80.0 - 100.0 fL   MCH 24.2 (L) 26.0 - 34.0 pg   MCHC 30.8 30.0 - 36.0 g/dL   RDW 18.3 (H) 11.5 - 15.5 %   Platelets 49 (L) 150 - 400 K/uL    Comment: REPEATED TO VERIFY SPECIMEN CHECKED FOR CLOTS Immature Platelet Fraction may be clinically indicated, consider ordering this additional test [id number] CONSISTENT WITH PREVIOUS RESULT    nRBC 0.0 0.0 - 0.2 %    Comment: Performed at [facility], [place] [address], [place], [place] [address]  Glucose, capillary     Status: Abnormal   Collection Time: 08/07/2019  7:29 AM  Result Value Ref Range   Glucose-Capillary 103 (H) 70 - 99 mg/dL    Comment: Glucose reference range applies only to samples taken after fasting for at least 8 hours.  Type and screen [place]     Status: None (Preliminary result)   Collection Time: 08/08/2019  9:47 AM  Result Value Ref Range   ABO/RH(D) B POS    Antibody Screen NEG    Sample Expiration 07/24/2019,2359    Unit Number [id number]    Blood Component Type RED CELLS,LR    Unit division 00    Status of Unit ISSUED    Transfusion Status OK TO TRANSFUSE    Crossmatch Result      Compatible Performed at [facility], [place]  [address], [place], [place] [address]   Prepare RBC     Status: None   Collection Time: 08/03/2019  9:47 AM  Result Value Ref Range   Order Confirmation      ORDER PROCESSED BY BLOOD BANK Performed at [facility], [place] [address], [place], [place] [address]   Glucose, capillary     Status: Abnormal   Collection Time: 07/30/2019 11:59 AM  Result Value Ref Range   Glucose-Capillary 110 (H) 70 - 99 mg/dL    Comment: Glucose reference range applies only to samples taken after fasting for at least 8 hours.   Comment 1 Notify RN    Comment 2 Document in Chart    *Note: Due to a large number of results and/or encounters for the requested time period, some results have not been displayed. A complete set of results can be found in Results Review.    Imaging / Studies: CT HEAD WO CONTRAST  Result Date: 07/19/2019 CLINICAL DATA:  Sudden onset of altered mental status. Infection suspected. EXAM: CT HEAD WITHOUT CONTRAST TECHNIQUE: Contiguous axial images were obtained from the  base of the skull through the vertex without intravenous contrast. COMPARISON:  None. FINDINGS: Brain: Study suffers from some motion degradation. No evidence of accelerated atrophy. No evidence of old or acute infarction, mass lesion, hemorrhage, hydrocephalus or extra-axial collection. Vascular: No abnormal vascular finding. Skull: Normal Sinuses/Orbits: No inflammatory sinus disease. Few small retention cysts. Mastoids are clear. Orbits negative. Other: None IMPRESSION: Motion degraded but otherwise normal exam. No abnormality seen to explain altered mental status. Electronically Signed   By: Nelson Chimes M.D.   On: 07/19/2019 22:28   CT ABDOMEN PELVIS W CONTRAST  Result Date: 07/25/2019 CLINICAL DATA:  54 year old male with history of abdominal pain and fever. Postoperative wounds. Shock. Evaluate for abscess formation. EXAM: CT ABDOMEN AND PELVIS WITH CONTRAST TECHNIQUE: Multidetector CT imaging of the abdomen  and pelvis was performed using the standard protocol following bolus administration of intravenous contrast. CONTRAST:  132m OMNIPAQUE IOHEXOL 300 MG/ML  SOLN COMPARISON:  CT the abdomen and pelvis 05/20/2019. FINDINGS: Lower chest: Moderate right and small left pleural effusions lying dependently. Intermediate attenuation debris lying dependently in the posterior aspect of the right pleural space, similar in retrospect to the prior examination. Extensive areas of passive atelectasis in the lower lobes of the lungs bilaterally. Hepatobiliary: No suspicious cystic or solid hepatic lesions. No intra or extrahepatic biliary ductal dilatation. Status post cholecystectomy. Pancreas: No pancreatic mass. No pancreatic ductal dilatation. No pancreatic or peripancreatic fluid collections or inflammatory changes. Spleen: Unremarkable. Adrenals/Urinary Tract: Subcentimeter low-attenuation lesion in the interpolar region of the left kidney, too small to characterize, but statistically likely to represent a cyst. Right kidney and bilateral adrenal glands are normal in appearance. No hydroureteronephrosis. Urinary bladder is completely decompressed around an indwelling suprapubic catheter. Stomach/Bowel: Stomach is severely distended with a large air-fluid level. No pathologic dilatation of small bowel or colon. Transverse colostomy in the left lower quadrant. Large amount of inspissated stool noted in the rectal vault. Vascular/Lymphatic: Atherosclerotic calcifications in the pelvic vasculature, without evidence of aneurysm or dissection in the abdominal or pelvic vasculature. Prominent borderline enlarged and mildly enlarged pelvic lymph nodes bilaterally, largest of which is a 1.5 cm left external iliac lymph node (axial image 85 of series 3), similar to the prior examination, likely chronic and reactive. No abdominal lymphadenopathy. Reproductive: Prostate gland and seminal vesicles are unremarkable in appearance. Other:  Trace volume of ascites. Mild diffuse body wall edema. No pneumoperitoneum. Musculoskeletal: Deep sacral decubitus ulcer extending to the bone. 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 Deep soft tissue ulcer in the right flank and right lateral abdominal wall, new compared to the prior examination. Extensive soft tissue gas tracking in the right flank cephalad, ultimately tracking posterior to the upper lumbar and lower thoracic spine, concerning for potential necrotizing fasciitis. Extensive irregular areas of sclerosis throughout the pelvic bones bilaterally, compatible with areas of chronic osteomyelitis. Large expansile lesion measuring 6.3 x 3.9 cm in the posterior aspect of the right fifth rib, incompletely imaged, but similar compared to prior CT the chest 05/20/2019. IMPRESSION: 1. Chronic areas of osteomyelitis, bilateral septic arthritis in the hip joints, deep decubitus ulcers overlying the sacrum and right hip joint, and new deep soft tissue ulcers in the right lateral abdominal wall and right flank, with new soft tissue gas extending in the subcutaneous soft tissues of the right flank and paraspinal regions, as detailed above. Findings are highly  concerning for developing necrotizing fasciitis in these regions. 2. Massive gastric distension. No pathologic distention of small bowel or colon. This could suggest gastric outlet obstruction or gastroparesis. 3. Trace volume of ascites and diffuse body wall edema with bilateral pleural effusions; imaging findings suggestive of a state of anasarca. 4. Moderate right and small left pleural effusions lying dependently with some debris lying dependently in the posterior aspect of the right pleural space. 5. Additional incidental findings, as above, similar to the prior study. These results will be called to the ordering clinician or representative by the  Radiologist Assistant, and communication documented in the PACS or zVision Dashboard. Electronically Signed   By: Vinnie Langton M.D.   On: 08/12/2019 12:00   IR Fluoro Guide CV Line Right  Result Date: 07/09/2019 INDICATION: Poor intravenous access, sacral decubitus ulcers, osteomyelitis of the sacral bone and chronic urinary tract infections. EXAM: TUNNELED CENTRAL VENOUS CATHETER PLACEMENT WITH ULTRASOUND AND FLUOROSCOPIC GUIDANCE MEDICATIONS: None ANESTHESIA/SEDATION: None FLUOROSCOPY TIME:  Fluoroscopy Time: 18 seconds.  0.7 mGy. COMPLICATIONS: None immediate. PROCEDURE: Informed written consent was obtained from the patient after a discussion of the risks, benefits, and alternatives to treatment. Questions regarding the procedure were encouraged and answered. The right neck and chest were prepped with chlorhexidine in a sterile fashion, and a sterile drape was applied covering the operative field. Maximum barrier sterile technique with sterile gowns and gloves were used for the procedure. A timeout was performed prior to the initiation of the procedure. Ultrasound was used to confirm patency of the right internal jugular vein. After creating a small venotomy incision, a micropuncture kit was utilized to access the right internal jugular vein under direct, real-time ultrasound guidance after the overlying soft tissues were anesthetized with 1% lidocaine. Ultrasound image documentation was performed. The microwire was kinked to measure appropriate catheter length. The micropuncture sheath was exchanged for a peel-away sheath over a guidewire. A 6 [place] dual lumen tunneled power line was tunneled in a retrograde fashion from the anterior chest wall to the venotomy incision. The catheter was cut to appropriate length. The catheter was then placed through the peel-away sheath with tip ultimately positioned at the superior caval-atrial junction. Final catheter positioning was confirmed and documented with a  spot radiographic image. The catheter aspirates and flushes normally. The catheter exit site was secured with Prolene an Ethilon retention sutures. The venotomy incision was closed with interrupted 4-0 Vicryl and Dermabond. Dressings were applied. FINDINGS: The power line was cut to 23 cm. After catheter placement, the tip lies within the SVC/RA junction. The catheter aspirates and flushes normally and is ready for immediate use. IMPRESSION: Successful placement of 23cm dual lumen tunneled central venous catheter via the right internal jugular vein with tip terminating at the SVC/RA junction. The catheter is ready for immediate use. Electronically Signed   By: Aletta Edouard M.D.   On: 07/09/2019 17:29   IR US Guide Vasc Access Right  Result Date: 07/09/2019 INDICATION: Poor intravenous access, sacral decubitus ulcers, osteomyelitis of the sacral bone and chronic urinary tract infections. EXAM: TUNNELED CENTRAL VENOUS CATHETER PLACEMENT WITH ULTRASOUND AND FLUOROSCOPIC GUIDANCE MEDICATIONS: None ANESTHESIA/SEDATION: None FLUOROSCOPY TIME:  Fluoroscopy Time: 18 seconds.  0.7 mGy. COMPLICATIONS: None immediate. PROCEDURE: Informed written consent was obtained from the patient after a discussion of the risks, benefits, and alternatives to treatment. Questions regarding the procedure were encouraged and answered. The right neck and chest were prepped with chlorhexidine in a sterile fashion, and a sterile drape was  applied covering the operative field. Maximum barrier sterile technique with sterile gowns and gloves were used for the procedure. A timeout was performed prior to the initiation of the procedure. Ultrasound was used to confirm patency of the right internal jugular vein. After creating a small venotomy incision, a micropuncture kit was utilized to access the right internal jugular vein under direct, real-time ultrasound guidance after the overlying soft tissues were anesthetized with 1% lidocaine.  Ultrasound image documentation was performed. The microwire was kinked to measure appropriate catheter length. The micropuncture sheath was exchanged for a peel-away sheath over a guidewire. A 6 [place] dual lumen tunneled power line was tunneled in a retrograde fashion from the anterior chest wall to the venotomy incision. The catheter was cut to appropriate length. The catheter was then placed through the peel-away sheath with tip ultimately positioned at the superior caval-atrial junction. Final catheter positioning was confirmed and documented with a spot radiographic image. The catheter aspirates and flushes normally. The catheter exit site was secured with Prolene an Ethilon retention sutures. The venotomy incision was closed with interrupted 4-0 Vicryl and Dermabond. Dressings were applied. FINDINGS: The power line was cut to 23 cm. After catheter placement, the tip lies within the SVC/RA junction. The catheter aspirates and flushes normally and is ready for immediate use. IMPRESSION: Successful placement of 23cm dual lumen tunneled central venous catheter via the right internal jugular vein with tip terminating at the SVC/RA junction. The catheter is ready for immediate use. Electronically Signed   By: Aletta Edouard M.D.   On: 07/09/2019 17:29   DG Chest Portable 1 View  Result Date: 07/15/2019 CLINICAL DATA:  Cough EXAM: PORTABLE CHEST 1 VIEW COMPARISON:  05/03/2020 FINDINGS: There is a well-positioned right-sided tunneled catheter. The tip terminates near the cavoatrial junction. Bibasilar airspace disease is noted, right worse than left. There are probable small bilateral pleural effusions. No pneumothorax. The heart size is stable. No acute osseous abnormality. There are old healed left-sided rib fractures. IMPRESSION: 1. Well-positioned central venous catheter without evidence for pneumothorax. 2. Small bilateral pleural effusions. 3. Bibasilar airspace opacities favored to represent atelectasis  with an infiltrate not entirely excluded. Electronically Signed   By: Constance Holster M.D.   On: 08/10/2019 16:42   DG Chest Port 1 View  Result Date: 07/05/2019 CLINICAL DATA:  Sepsis. EXAM: PORTABLE CHEST 1 VIEW COMPARISON:  07/03/2019 and older exams.  CT, 05/02/2019. FINDINGS: Cardiac silhouette normal in size and configuration. No mediastinal or hilar masses. Mild opacity at the lung bases likely due to small effusions with atelectasis. Remainder of the lungs is clear. No pneumothorax. Old left-sided rib fractures, healed. No acute skeletal abnormality. IMPRESSION: 1. Mild lung base opacity consistent with small effusions and atelectasis. 2. No acute cardiopulmonary disease. Electronically Signed   By: [name] M.D.   On: 07/05/2019 14:24   DG Chest Port 1 View  Result Date: 07/03/2019 CLINICAL DATA:  54 year old male with sepsis and hypotension. Line placement. EXAM: PORTABLE CHEST 1 VIEW COMPARISON:  Portable chest 06/18/2019 and earlier. FINDINGS: Portable AP semi upright view at 2134 hours. No central line is identified. Stable lung volumes and mediastinal contours. Visualized tracheal air column is within normal limits. Allowing for portable technique the lungs are clear. No acute osseous abnormality identified. IMPRESSION: 1. No central line is identified. 2.  No acute cardiopulmonary abnormality. Electronically Signed   By: Genevie Ann M.D.   On: 07/03/2019 21:54   DG Abd Portable 1V  Result Date:  08/09/2019 CLINICAL DATA:  Bedside nasogastric tube placement. EXAM: PORTABLE ABDOMEN - 1 VIEW COMPARISON:  05/16/2019. FINDINGS: The nasogastric tube tip is in the mid to distal body of the stomach. Visualized bowel gas pattern likely represents a mild ileus. IMPRESSION: Nasogastric tube tip in the mid to distal body of the stomach. Electronically Signed   By: Evangeline Dakin M.D.   On: 08/10/2019 12:41    Medications / Allergies: per chart  Antibiotics: Anti-infectives (From admission,  onward)   Start     Dose/Rate Route Frequency Ordered Stop   08/08/2019 0600  clindamycin (CLEOCIN) IVPB 900 mg  Status:  Discontinued     900 mg 100 mL/hr over 30 Minutes Intravenous On call to O.R. 07/31/2019 1320 08/02/2019 1321   08/09/2019 0600  gentamicin (GARAMYCIN) 360 mg in dextrose 5 % 100 mL IVPB  Status:  Discontinued     5 mg/kg  71.2 kg 109 mL/hr over 60 Minutes Intravenous On call to O.R. 08/05/2019 1320 08/05/2019 1321   07/26/2019 0000  vancomycin (VANCOREADY) IVPB 1250 mg/250 mL     1,250 mg 166.7 mL/hr over 90 Minutes Intravenous Every 12 hours 07/24/2019 1246     08/11/2019 1400  vancomycin (VANCOREADY) IVPB 1500 mg/300 mL     1,500 mg 150 mL/hr over 120 Minutes Intravenous  Once 08/06/2019 1230     08/12/2019 1330  piperacillin-tazobactam (ZOSYN) IVPB 3.375 g     3.375 g 12.5 mL/hr over 240 Minutes Intravenous Every 8 hours 07/15/2019 1321     07/31/2019 1230  vancomycin (VANCOCIN) IVPB 1000 mg/200 mL premix  Status:  Discontinued     1,000 mg 200 mL/hr over 60 Minutes Intravenous  Once 08/12/2019 1225 08/13/2019 1230   07/19/19 0400  vancomycin (VANCOREADY) IVPB 1250 mg/250 mL  Status:  Discontinued     1,250 mg 166.7 mL/hr over 90 Minutes Intravenous Every 12 hours 07/18/19 1457 07/19/19 1011   07/18/19 1600  vancomycin (VANCOREADY) IVPB 1500 mg/300 mL     1,500 mg 150 mL/hr over 120 Minutes Intravenous  Once 07/18/19 1457 07/18/19 1745   07/18/19 0200  ceFEPIme (MAXIPIME) 2 g in sodium chloride 0.9 % 100 mL IVPB  Status:  Discontinued     2 g 200 mL/hr over 30 Minutes Intravenous Every 8 hours 07/18/19 0054 07/29/2019 1321   08/08/2019 1715  ceFEPIme (MAXIPIME) 2 g in sodium chloride 0.9 % 100 mL IVPB     2 g 200 mL/hr over 30 Minutes Intravenous  Once 07/30/2019 1707 07/25/2019 1818   07/15/2019 1715  metroNIDAZOLE (FLAGYL) IVPB 500 mg     500 mg 100 mL/hr over 60 Minutes Intravenous  Once 08/03/2019 1707 07/31/2019 1900        Note: Portions of this report may have been transcribed using  voice recognition software. Every effort was made to ensure accuracy; however, inadvertent computerized transcription errors may be present.   Any transcriptional errors that result from this process are unintentional.    Adin Hector, MD, FACS, MASCRS Gastrointestinal and Minimally Invasive Surgery  [facility] [address], [place], [name] [address] [phone] Main / Paging [phone] Fax     07/22/2019

## 2019-07-21 NOTE — Progress Notes (Signed)
CT reviewed, NGT placed with bilious return Asked plastics to review and let me know their thoughts. Do no think there is going to be good outcome here, will relay to sister.  He remains full code.

## 2019-07-21 NOTE — Transfer of Care (Signed)
Immediate Anesthesia Transfer of Care Note  Patient: [name] [name]  Procedure(s) Performed: INCISION AND Debridement necritizing fascaititis back,flank, and hip  (N/A Back)  Patient LocationICU  Anesthesia Type:General  Level of Consciousness: Patient remains intubated per anesthesia plan  Airway & Oxygen Therapy: Patient remains intubated per anesthesia plan and Patient placed on Ventilator (see vital sign flow sheet for setting)  Post-op Assessment: Report given to RN and Post -op Vital signs reviewed and stable  Post vital signs: stable  Last Vitals:  Vitals Value Taken Time  BP    Temp    Pulse 80 07/17/2019 1719  Resp 18 07/30/2019 1719  SpO2 99 % 07/29/2019 1719  Vitals shown include unvalidated device data.  Last Pain:  Vitals:   07/30/2019 1339  TempSrc: Oral  PainSc:          Complications: No apparent anesthesia complications

## 2019-07-21 NOTE — Progress Notes (Signed)
[facility] ADULT ICU REPLACEMENT PROTOCOL FOR AM LAB REPLACEMENT ONLY  The patient does apply for the [facility] Adult ICU Electrolyte Replacment Protocol based on the criteria listed below:   1. Is GFR >/= 40 ml/min? Yes.    Patient's GFR today is >60 2. Is urine output >/= 0.5 ml/kg/hr for the last 6 hours? Yes.   Patient's UOP is 1.87 ml/kg/hr 3. Is BUN < 60 mg/dL? Yes.    Patient's BUN today is 20 4. Abnormal electrolyte(s): K+3.1 5. Ordered repletion with: protocol 6. If a panic level lab has been reported, has the CCM MD in charge been notified? Yes.  .   Physician:  Dr. Nicki Guadalajara, [place] Grumbling 07/30/2019 5:36 AM

## 2019-07-22 ENCOUNTER — Encounter: Payer: Self-pay | Admitting: *Deleted

## 2019-07-22 ENCOUNTER — Encounter (HOSPITAL_COMMUNITY): Admission: EM | Disposition: E | Payer: Self-pay | Source: Home / Self Care | Attending: Internal Medicine

## 2019-07-22 DIAGNOSIS — Z9911 Dependence on respirator [ventilator] status: Secondary | ICD-10-CM

## 2019-07-22 DIAGNOSIS — M726 Necrotizing fasciitis: Secondary | ICD-10-CM

## 2019-07-22 DIAGNOSIS — K3184 Gastroparesis: Secondary | ICD-10-CM

## 2019-07-22 DIAGNOSIS — K5909 Other constipation: Secondary | ICD-10-CM

## 2019-07-22 DIAGNOSIS — Z7189 Other specified counseling: Secondary | ICD-10-CM

## 2019-07-22 DIAGNOSIS — I96 Gangrene, not elsewhere classified: Secondary | ICD-10-CM

## 2019-07-22 DIAGNOSIS — K311 Adult hypertrophic pyloric stenosis: Secondary | ICD-10-CM

## 2019-07-22 DIAGNOSIS — Z515 Encounter for palliative care: Secondary | ICD-10-CM

## 2019-07-22 DIAGNOSIS — D696 Thrombocytopenia, unspecified: Secondary | ICD-10-CM

## 2019-07-22 DIAGNOSIS — R6521 Severe sepsis with septic shock: Secondary | ICD-10-CM

## 2019-07-22 DIAGNOSIS — R000 Tachycardia, unspecified: Secondary | ICD-10-CM

## 2019-07-22 LAB — TYPE AND SCREEN
ABO/RH(D): B POS
Antibody Screen: NEGATIVE
Unit division: 0
Unit division: 0
Unit division: 0
Unit division: 0

## 2019-07-22 LAB — CULTURE, BLOOD (ROUTINE X 2): Culture: NO GROWTH

## 2019-07-22 LAB — PREPARE FRESH FROZEN PLASMA: Unit division: 0

## 2019-07-22 LAB — BASIC METABOLIC PANEL
Anion gap: 9 (ref 5–15)
BUN: 19 mg/dL (ref 6–20)
CO2: 17 mmol/L — ABNORMAL LOW (ref 22–32)
Calcium: 7.4 mg/dL — ABNORMAL LOW (ref 8.9–10.3)
Chloride: 114 mmol/L — ABNORMAL HIGH (ref 98–111)
Creatinine, Ser: <0.3 mg/dL — ABNORMAL LOW (ref 0.61–1.24)
Glucose, Bld: 125 mg/dL — ABNORMAL HIGH (ref 70–99)
Potassium: 2.4 mmol/L — CL (ref 3.5–5.1)
Sodium: 140 mmol/L (ref 135–145)

## 2019-07-22 LAB — PREPARE PLATELET PHERESIS: Unit division: 0

## 2019-07-22 LAB — PHOSPHORUS: Phosphorus: 1.4 mg/dL — ABNORMAL LOW (ref 2.5–4.6)

## 2019-07-22 LAB — BPAM RBC
Blood Product Expiration Date: 202104042359
Blood Product Expiration Date: 202104062359
Blood Product Expiration Date: 202104072359
Blood Product Expiration Date: 202104072359
ISSUE DATE / TIME: 202103071313
ISSUE DATE / TIME: 202103071451
ISSUE DATE / TIME: 202103071526
ISSUE DATE / TIME: 202103071611
Unit Type and Rh: 7300
Unit Type and Rh: 7300
Unit Type and Rh: 7300
Unit Type and Rh: 7300

## 2019-07-22 LAB — CBC
HCT: 30.7 % — ABNORMAL LOW (ref 39.0–52.0)
Hemoglobin: 10.1 g/dL — ABNORMAL LOW (ref 13.0–17.0)
MCH: 27 pg (ref 26.0–34.0)
MCHC: 32.9 g/dL (ref 30.0–36.0)
MCV: 82.1 fL (ref 80.0–100.0)
Platelets: 52 10*3/uL — ABNORMAL LOW (ref 150–400)
RBC: 3.74 MIL/uL — ABNORMAL LOW (ref 4.22–5.81)
RDW: 16.8 % — ABNORMAL HIGH (ref 11.5–15.5)
WBC: 21.8 10*3/uL — ABNORMAL HIGH (ref 4.0–10.5)
nRBC: 0 % (ref 0.0–0.2)

## 2019-07-22 LAB — BLOOD GAS, ARTERIAL
Acid-base deficit: 7.7 mmol/L — ABNORMAL HIGH (ref 0.0–2.0)
Bicarbonate: 15.6 mmol/L — ABNORMAL LOW (ref 20.0–28.0)
Drawn by: 44126
FIO2: 40
MECHVT: 620 mL
O2 Saturation: 97.6 %
PEEP: 5 cmH2O
Patient temperature: 98.6
RATE: 18 resp/min
pCO2 arterial: 25.8 mmHg — ABNORMAL LOW (ref 32.0–48.0)
pH, Arterial: 7.398 (ref 7.350–7.450)
pO2, Arterial: 149 mmHg — ABNORMAL HIGH (ref 83.0–108.0)

## 2019-07-22 LAB — GLUCOSE, CAPILLARY
Glucose-Capillary: 110 mg/dL — ABNORMAL HIGH (ref 70–99)
Glucose-Capillary: 111 mg/dL — ABNORMAL HIGH (ref 70–99)
Glucose-Capillary: 111 mg/dL — ABNORMAL HIGH (ref 70–99)
Glucose-Capillary: 116 mg/dL — ABNORMAL HIGH (ref 70–99)
Glucose-Capillary: 135 mg/dL — ABNORMAL HIGH (ref 70–99)
Glucose-Capillary: 151 mg/dL — ABNORMAL HIGH (ref 70–99)
Glucose-Capillary: 163 mg/dL — ABNORMAL HIGH (ref 70–99)

## 2019-07-22 LAB — BPAM FFP
Blood Product Expiration Date: 202103122359
Blood Product Expiration Date: 202103122359
ISSUE DATE / TIME: 202103071514
ISSUE DATE / TIME: 202103071525
Unit Type and Rh: 7300
Unit Type and Rh: 7300

## 2019-07-22 LAB — PROCALCITONIN: Procalcitonin: 4.14 ng/mL

## 2019-07-22 LAB — BPAM PLATELET PHERESIS
Blood Product Expiration Date: 202103072359
ISSUE DATE / TIME: 202103072236
Unit Type and Rh: 5100

## 2019-07-22 LAB — PROTIME-INR
INR: 1.8 — ABNORMAL HIGH (ref 0.8–1.2)
INR: 1.9 — ABNORMAL HIGH (ref 0.8–1.2)
Prothrombin Time: 21 seconds — ABNORMAL HIGH (ref 11.4–15.2)
Prothrombin Time: 21.3 seconds — ABNORMAL HIGH (ref 11.4–15.2)

## 2019-07-22 LAB — MAGNESIUM: Magnesium: 2.5 mg/dL — ABNORMAL HIGH (ref 1.7–2.4)

## 2019-07-22 LAB — PREALBUMIN: Prealbumin: 5.1 mg/dL — ABNORMAL LOW (ref 18–38)

## 2019-07-22 LAB — POTASSIUM: Potassium: 2.5 mmol/L — CL (ref 3.5–5.1)

## 2019-07-22 LAB — APTT: aPTT: 41 seconds — ABNORMAL HIGH (ref 24–36)

## 2019-07-22 SURGERY — IRRIGATION AND DEBRIDEMENT WOUND
Anesthesia: General

## 2019-07-22 MED ORDER — SODIUM PHOSPHATES 45 MMOLE/15ML IV SOLN
30.0000 mmol | Freq: Once | INTRAVENOUS | Status: AC
  Administered 2019-07-22: 30 mmol via INTRAVENOUS
  Filled 2019-07-22: qty 10

## 2019-07-22 MED ORDER — POTASSIUM CHLORIDE 20 MEQ/15ML (10%) PO SOLN
40.0000 meq | ORAL | Status: AC
  Administered 2019-07-22 (×3): 40 meq via ORAL
  Filled 2019-07-22 (×4): qty 30

## 2019-07-22 MED ORDER — PANTOPRAZOLE SODIUM 40 MG IV SOLR
40.0000 mg | Freq: Every day | INTRAVENOUS | Status: DC
  Administered 2019-07-22 – 2019-08-01 (×11): 40 mg via INTRAVENOUS
  Filled 2019-07-22 (×9): qty 40

## 2019-07-22 MED ORDER — LINEZOLID 600 MG/300ML IV SOLN
600.0000 mg | Freq: Two times a day (BID) | INTRAVENOUS | Status: DC
  Administered 2019-07-22 – 2019-07-23 (×4): 600 mg via INTRAVENOUS
  Filled 2019-07-22 (×5): qty 300

## 2019-07-22 MED ORDER — CALCIUM GLUCONATE-NACL 1-0.675 GM/50ML-% IV SOLN
1.0000 g | Freq: Once | INTRAVENOUS | Status: AC
  Administered 2019-07-22: 1000 mg via INTRAVENOUS
  Filled 2019-07-22: qty 50

## 2019-07-22 MED ORDER — MAGNESIUM SULFATE 4 GM/100ML IV SOLN
4.0000 g | Freq: Once | INTRAVENOUS | Status: AC
  Administered 2019-07-22: 4 g via INTRAVENOUS
  Filled 2019-07-22: qty 100

## 2019-07-22 MED ORDER — POTASSIUM CHLORIDE 10 MEQ/50ML IV SOLN
10.0000 meq | INTRAVENOUS | Status: AC
  Administered 2019-07-22 (×6): 10 meq via INTRAVENOUS
  Filled 2019-07-22 (×6): qty 50

## 2019-07-22 MED ORDER — FAMOTIDINE IN NACL 20-0.9 MG/50ML-% IV SOLN: 20.0000 mg | INTRAVENOUS | Status: DC
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
## 2019-07-22 NOTE — Progress Notes (Signed)
Nutrition Follow-up  DOCUMENTATION CODES:   Not applicable  INTERVENTION:  - once TF able to be started, recommend Pivot 1.5 @ 20 ml/hr to advance by 10 ml every 12 hours to reach goal rate of 60 ml/hr with 30 ml prostat QID. - at goal rate, this regimen will provide 2560 kcal, 195 grams protein, and 1080 ml free water.  - recommend 500 mg ascorbic acid BID and 220 mg zinc sulfate/day.   Monitor magnesium, potassium, and phosphorus daily for at least 3 days, MD to replete as needed, as pt is at risk for refeeding syndrome given current hypokalemia and hypophosphatemia, poor intake for at least 1 week.   NUTRITION DIAGNOSIS:   Increased nutrient needs related to acute illness, wound healing, post-op healing as evidenced by estimated needs. -ongoing  GOAL:   Patient will meet greater than or equal to 90% of their needs -unmet  MONITOR:   Vent status, Labs, Weight trends, Skin  ASSESSMENT:   54 y.o. male with medical history significant of quadriplegia, suprapubic catheter, stage 4 sacral decubitus, obesity, chronic hypotension on midodrine, recurrent UTIs. Patient went to Urologist's office on 3/3 and had suprapubic catheter exchanged. After this he developed tachycardia and hypotension and was sent to the ED.  Significant Events: 3/3- admission 3/4- initial RD assessment 3/7- OR for excision and extensive debridement of lower back, R chest wall, R flank, R anterior abdominal wall; excision of L lower back    He has not been weighed since 3/3. Skin/wounds documented based on WOC note on 3/4 afternoon. Will updated as needed/able.   He was on Regular diet and per flow sheet documentation, he consumed 25% of breakfast and 75% of lunch on 3/4; 25% of breakfast and dinner on 3/6. No other intakes documented since admission.   Patient remains intubated with NGT to LIS since surgery. Plan to return to OR tomorrow (3/9). TF recommendations outlined above for when able to initiate. Blue  liquid in NGT tubing, nothing in wall canister   Per notes: - septic shock d/t extensive soft tissue infection  - hypoxemic respiratory failure - ileus vs gastroparesis--NGT to LIS - acute on chronic anemia--improved  - severe hypokalemia   Patient is currently intubated on ventilator support MV: 11.1 L/min Temp (24hrs), Avg:98.5 F (36.9 C), Min:97.3 F (36.3 C), Max:100 F (37.8 C) Propofol: none BP: 114/68 and MAP: 84    Labs reviewed; CBGs: 111 and 110 mg/dl, K: 2.4 mmol/l, Cl: 114 mmol/l, creatinine: <0.3 mg/dl, Ca: 7.4 mg/dl, Phos: 1.4 mg/dl, Mg: 2.5 mg/dl. Medications reviewed; 325 mg ferrous sulfate/day, sliding scale novolog, 4 g IV Mg sulfate x1 run 3/8, 40 mg IV protonix/day, 10 mEq IV KCl x6 runs 3/7, 30 mmol IV NaPhos x1 run 3/8. Drips; vaso @ 0.04 units/min, levo @ 19 mcg/min, fentanyl @ 75 mcg/hr.   Diet Order:   Diet Order            Diet NPO time specified  Diet effective midnight              EDUCATION NEEDS:   Not appropriate for education at this time  Skin:  Skin Assessment: Skin Integrity Issues: Other: Left trochanter: 5cm x 4cm (100% slough moist); Right flank: 3cm x 3cm Unstageable (100% nonviable); Right lateral chest: 9cm x 7cm x 0.1cm; Right thoracic area: 4cm x 3cm x 0.1cm (100% necrotic); Right thoracic area distal: 5cm x 2cm x 0.2cm (50% nonviable/50% pale non-granual); Right abdominal area: 3cmx 4cm x 3cm (80%  pale, non granular/20% yellow/green); Sacrum/buttocks/ITs: 26cm x 30cm x 0.4cm (75% red, 25% nonviable; area will not heal at this point); Scattered partial thickness ulcers over the right hip: pale with fibrinous material, dry; Left medial heel: 3cm x 3cm x 0cm (100% hard stable eschar)  Last BM:  3/8, 50 ml type 6 via colostomy  Height:   Ht Readings from Last 1 Encounters:  07/18/19 6' (1.829 m)    Weight:   Wt Readings from Last 1 Encounters:  08/11/2019 71.2 kg    Ideal Body Weight:  80.9 kg  BMI:  Body mass index is  21.29 kg/m.  Estimated Nutritional Needs:   Kcal:  2400-2600 kcal  Protein:  164-199 grams (2.3-2.8 grams/kg)  Fluid:  >/= 2.5 L/day     [name], MS, RD, LDN, CNSC Inpatient Clinical Dietitian RD pager # available in [facility]  After hours/weekend pager # available in [facility]

## 2019-07-22 NOTE — Progress Notes (Signed)
1 Day Post-Op  Subjective: Patient on vent on 2 pressors.  Sedated right now.  Will try to open eyes some, but mostly doesn't respond.  ROS: unable, on vent sedated  Objective: Vital signs in last 24 hours: Temp:  [97.4 F (36.3 C)-100 F (37.8 C)] 97.4 F (36.3 C) (03/08 0400) Pulse Rate:  [55-102] 58 (03/08 0717) Resp:  [13-31] 18 (03/08 0717) BP: (86-145)/(52-122) 115/73 (03/08 0400) SpO2:  [97 %-100 %] 100 % (03/08 0717) Arterial Line BP: (81-135)/(43-70) 114/61 (03/08 0700) FiO2 (%):  [40 %-50 %] 40 % (03/08 0717) Last BM Date: 07/26/2019  Intake/Output from previous day: 03/07 0701 - 03/08 0700 In: 6978.1 [P.O.:50; I.V.:3983.6; Blood:1719.9; IV Piggyback:1224.6] Out: 4925 [Urine:2075; Emesis/NG output:2600; Stool:50; Blood:200] Intake/Output this shift: No intake/output data recorded.  PE: Gen: critically ill on vent Heart: regular Lungs: ETT in place, on vent with nonlabored breathing Abd: soft, +BS, ostomy working well.  SP tube in place Skin: did not turn patient to look at wounds given on vent and going back to OR tomorrow Psych: sedated on vent  Lab Results:  Recent Labs    08/04/2019 1906 08/03/2019 0418  WBC 15.8* 21.8*  HGB 10.6* 10.1*  HCT 33.4* 30.7*  PLT 28* 52*   BMET Recent Labs    07/25/2019 1906 07/21/2019 0418  NA 141 140  K 1.5* 2.4*  CL 117* 114*  CO2 18* 17*  GLUCOSE 115* 125*  BUN 18 19  CREATININE <0.30* <0.30*  CALCIUM 6.6* 7.4*   PT/INR Recent Labs    07/18/2019 0008 08/14/2019 0418  LABPROT 21.3* 21.0*  INR 1.9* 1.8*   CMP     Component Value Date/Time   NA 140 08/10/2019 0418   NA 139 11/10/2016 0000   NA 137 10/07/2014 1148   K 2.4 (LL) 07/16/2019 0418   K 3.7 10/07/2014 1148   CL 114 (H) 07/31/2019 0418   CO2 17 (L) 08/02/2019 0418   CO2 22 10/07/2014 1148   GLUCOSE 125 (H) 08/05/2019 0418   GLUCOSE 117 10/07/2014 1148   BUN 19 08/02/2019 0418   BUN 10 11/10/2016 0000   BUN 8.7 10/07/2014 1148   CREATININE  <0.30 (L) 07/25/2019 0418   CREATININE 0.57 (L) 07/01/2015 1606   CREATININE 0.6 (L) 10/07/2014 1148   CALCIUM 7.4 (L) 07/17/2019 0418   CALCIUM 9.1 10/07/2014 1148   PROT 4.3 (L) 08/13/2019 1926   PROT 8.0 10/07/2014 1148   ALBUMIN 1.1 (L) 08/02/2019 1926   ALBUMIN 2.8 (L) 10/07/2014 1148   AST 14 (L) 08/03/2019 1926   AST 16 10/07/2014 1148   ALT 11 08/04/2019 1926   ALT 7 10/07/2014 1148   ALKPHOS 81 08/10/2019 1926   ALKPHOS 80 10/07/2014 1148   BILITOT 0.7 07/31/2019 1926   BILITOT <0.20 10/07/2014 1148   GFRNONAA NOT CALCULATED 08/03/2019 0418   GFRAA NOT CALCULATED 08/05/2019 0418   Lipase     Component Value Date/Time   LIPASE 12 07/29/2019 1926       Studies/Results: CT ABDOMEN PELVIS W CONTRAST  Result Date: 07/24/2019 CLINICAL DATA:  54 year old male with history of abdominal pain and fever. Postoperative wounds. Shock. Evaluate for abscess formation. EXAM: CT ABDOMEN AND PELVIS WITH CONTRAST TECHNIQUE: Multidetector CT imaging of the abdomen and pelvis was performed using the standard protocol following bolus administration of intravenous contrast. CONTRAST:  128mL OMNIPAQUE IOHEXOL 300 MG/ML  SOLN COMPARISON:  CT the abdomen and pelvis 05/20/2019. FINDINGS: Lower chest: Moderate right and small  left pleural effusions lying dependently. Intermediate attenuation debris lying dependently in the posterior aspect of the right pleural space, similar in retrospect to the prior examination. Extensive areas of passive atelectasis in the lower lobes of the lungs bilaterally. Hepatobiliary: No suspicious cystic or solid hepatic lesions. No intra or extrahepatic biliary ductal dilatation. Status post cholecystectomy. Pancreas: No pancreatic mass. No pancreatic ductal dilatation. No pancreatic or peripancreatic fluid collections or inflammatory changes. Spleen: Unremarkable. Adrenals/Urinary Tract: Subcentimeter low-attenuation lesion in the interpolar region of the left kidney, too  small to characterize, but statistically likely to represent a cyst. Right kidney and bilateral adrenal glands are normal in appearance. No hydroureteronephrosis. Urinary bladder is completely decompressed around an indwelling suprapubic catheter. Stomach/Bowel: Stomach is severely distended with a large air-fluid level. No pathologic dilatation of small bowel or colon. Transverse colostomy in the left lower quadrant. Large amount of inspissated stool noted in the rectal vault. Vascular/Lymphatic: Atherosclerotic calcifications in the pelvic vasculature, without evidence of aneurysm or dissection in the abdominal or pelvic vasculature. Prominent borderline enlarged and mildly enlarged pelvic lymph nodes bilaterally, largest of which is a 1.5 cm left external iliac lymph node (axial image 85 of series 3), similar to the prior examination, likely chronic and reactive. No abdominal lymphadenopathy. Reproductive: Prostate gland and seminal vesicles are unremarkable in appearance. Other: Trace volume of ascites. Mild diffuse body wall edema. No pneumoperitoneum. Musculoskeletal: Deep sacral decubitus ulcer extending to the bone. 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 Deep soft tissue ulcer in the right flank and right lateral abdominal wall, new compared to the prior examination. Extensive soft tissue gas tracking in the right flank cephalad, ultimately tracking posterior to the upper lumbar and lower thoracic spine, concerning for potential necrotizing fasciitis. Extensive irregular areas of sclerosis throughout the pelvic bones bilaterally, compatible with areas of chronic osteomyelitis. Large expansile lesion measuring 6.3 x 3.9 cm in the posterior aspect of the right fifth rib, incompletely imaged, but similar compared to prior CT the chest 05/20/2019. IMPRESSION: 1. Chronic areas of osteomyelitis, bilateral septic  arthritis in the hip joints, deep decubitus ulcers overlying the sacrum and right hip joint, and new deep soft tissue ulcers in the right lateral abdominal wall and right flank, with new soft tissue gas extending in the subcutaneous soft tissues of the right flank and paraspinal regions, as detailed above. Findings are highly concerning for developing necrotizing fasciitis in these regions. 2. Massive gastric distension. No pathologic distention of small bowel or colon. This could suggest gastric outlet obstruction or gastroparesis. 3. Trace volume of ascites and diffuse body wall edema with bilateral pleural effusions; imaging findings suggestive of a state of anasarca. 4. Moderate right and small left pleural effusions lying dependently with some debris lying dependently in the posterior aspect of the right pleural space. 5. Additional incidental findings, as above, similar to the prior study. These results will be called to the ordering clinician or representative by the Radiologist Assistant, and communication documented in the PACS or zVision Dashboard. Electronically Signed   By: Vinnie Langton M.D.   On: 07/17/2019 12:00   DG CHEST PORT 1 VIEW  Result Date: 08/14/2019 CLINICAL DATA:  Acute respiratory failure. Endotracheally intubated. Fever and tachycardia. Quadriplegia. EXAM: PORTABLE CHEST 1 VIEW COMPARISON:  Prior today FINDINGS: The endotracheal tube tube has been pulled back, with tip now 6.5 cm above the carina. Other support  lines and tubes remain in stable position. No pneumothorax visualized. Cardiomegaly, diffuse bilateral pulmonary airspace disease, and small layering bilateral pleural effusions show no significant change. IMPRESSION: 1. Endotracheal tube tip now 6.5 cm above the carina. 2. Stable cardiomegaly, diffuse bilateral pulmonary airspace disease, and small layering bilateral pleural effusions. Electronically Signed   By: Marlaine Hind M.D.   On: 07/31/2019 19:16   DG CHEST PORT 1  VIEW  Result Date: 07/20/2019 CLINICAL DATA:  54 year old male status post intubation. EXAM: PORTABLE CHEST 1 VIEW COMPARISON:  Chest radiograph dated 07/16/2019. FINDINGS: Endotracheal tube with tip at the level of the carina tilting towards the right mainstem bronchus. Recommend retraction by approximately 4 cm. Right IJ central venous line with tip over central SVC. Left IJ central venous line with tip in the left innominate vein. Enteric tube extends below the diaphragm with tip beyond the inferior margin of the image. There is cardiomegaly with vascular congestion and edema. Small to moderate bilateral pleural effusions noted. Pneumonia is not excluded. Clinical correlation is recommended. No pneumothorax. No acute osseous pathology. IMPRESSION: 1. Endotracheal tube at the level of the carina tilting towards the right mainstem bronchus. Recommend retraction by approximately 4 cm. 2. Cardiomegaly with findings of CHF and bilateral pleural effusions. Pneumonia is not excluded. These results were called by telephone at the time of interpretation on 08/06/2019 at 5:19 pm to provider [facility] , who verbally acknowledged these results. Electronically Signed   By: Anner Crete M.D.   On: 08/10/2019 17:32   DG Abd Portable 1V  Result Date: 08/03/2019 CLINICAL DATA:  Bedside nasogastric tube placement. EXAM: PORTABLE ABDOMEN - 1 VIEW COMPARISON:  05/16/2019. FINDINGS: The nasogastric tube tip is in the mid to distal body of the stomach. Visualized bowel gas pattern likely represents a mild ileus. IMPRESSION: Nasogastric tube tip in the mid to distal body of the stomach. Electronically Signed   By: Evangeline Dakin M.D.   On: 07/31/2019 12:41    Anti-infectives: Anti-infectives (From admission, onward)   Start     Dose/Rate Route Frequency Ordered Stop   08/13/2019 0600  clindamycin (CLEOCIN) IVPB 900 mg  Status:  Discontinued     900 mg 100 mL/hr over 30 Minutes Intravenous On call to O.R. 07/30/2019 1320  08/06/2019 1321   07/25/2019 0600  gentamicin (GARAMYCIN) 360 mg in dextrose 5 % 100 mL IVPB  Status:  Discontinued     5 mg/kg  71.2 kg 109 mL/hr over 60 Minutes Intravenous On call to O.R. 08/14/2019 1320 07/20/2019 1321   07/22/19 0000  vancomycin (VANCOREADY) IVPB 1250 mg/250 mL     1,250 mg 166.7 mL/hr over 90 Minutes Intravenous Every 12 hours 07/16/2019 1246     07/22/2019 1400  vancomycin (VANCOREADY) IVPB 1500 mg/300 mL     1,500 mg 150 mL/hr over 120 Minutes Intravenous  Once 08/11/2019 1230 08/14/2019 1600   07/24/2019 1400  piperacillin-tazobactam (ZOSYN) IVPB 3.375 g     3.375 g 12.5 mL/hr over 240 Minutes Intravenous Every 8 hours 07/19/2019 1321     08/07/2019 1230  vancomycin (VANCOCIN) IVPB 1000 mg/200 mL premix  Status:  Discontinued     1,000 mg 200 mL/hr over 60 Minutes Intravenous  Once 07/20/2019 1225 08/07/2019 1230   07/19/19 0400  vancomycin (VANCOREADY) IVPB 1250 mg/250 mL  Status:  Discontinued     1,250 mg 166.7 mL/hr over 90 Minutes Intravenous Every 12 hours 07/18/19 1457 07/19/19 1011   07/18/19 1600  vancomycin (VANCOREADY) IVPB  1500 mg/300 mL     1,500 mg 150 mL/hr over 120 Minutes Intravenous  Once 07/18/19 1457 07/18/19 1745   07/18/19 0200  ceFEPIme (MAXIPIME) 2 g in sodium chloride 0.9 % 100 mL IVPB  Status:  Discontinued     2 g 200 mL/hr over 30 Minutes Intravenous Every 8 hours 07/18/19 0054 08/03/2019 1321   07/30/2019 1715  ceFEPIme (MAXIPIME) 2 g in sodium chloride 0.9 % 100 mL IVPB     2 g 200 mL/hr over 30 Minutes Intravenous  Once 07/25/2019 1707 08/08/2019 1818   08/13/2019 1715  metroNIDAZOLE (FLAGYL) IVPB 500 mg     500 mg 100 mL/hr over 60 Minutes Intravenous  Once 07/21/2019 1707 07/16/2019 1900       Assessment/Plan  VDRF - per CCM 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 Johney Maine  -plan to return to OR tomorrow for second look.  Will also let [name] know as she was planning for a procedure on the patient this week as well -cont abx therapy -leave dressings in place today and will remove in OR tomorrow  FEN - NPO, on vent VTE - none currently ID - vanc/zosyn Foley - SP tube   LOS: 5 days    Henreitta Cea , [facility] 08/05/2019, 7:37 AM Please see Amion for pager number during day hours 7:00am-4:30pm or 7:00am -11:30am on weekends

## 2019-07-22 NOTE — Progress Notes (Signed)
[facility] Progress Note Patient Name: [name] [name] DOB: 03/20/1966 MRN: [id number]   Date of Service  08/06/2019  HPI/Events of Note  K+ = 2.4, Ca++ = 7.4, PO4--- = 1.4 and Creatinine < 0.30.   eICU Interventions  Will order: 1. Replace K+, Ca++ and PO4---. 2. ABG now.      Intervention Category Major Interventions: Electrolyte abnormality - evaluation and management  Felicidad Sugarman [place] 08/14/2019, 6:11 AM

## 2019-07-22 NOTE — Progress Notes (Signed)
CRITICAL VALUE ALERT  Critical Value: K+ 2.4  Date & Time Notied:  3/8 @ [id number]  Provider Notified: elink  Orders Received/Actions taken: awaiting orders

## 2019-07-22 NOTE — Progress Notes (Addendum)
Subjective: [name] [name] is 1 day post-op from debridement with general surgery.   He is on the vent today, responsive to questions via nodding. He denies pain, but nods yes to general weakness. Eyes open to verbal stimuli.  Planning to return to OR tomorrow with Dr. Ninfa Linden.  Objective: Vital signs in last 24 hours: Temp:  [97.3 F (36.3 C)-100 F (37.8 C)] 97.3 F (36.3 C) (03/08 1200) Pulse Rate:  [53-98] 69 (03/08 1300) Resp:  [17-21] 18 (03/08 1300) BP: (103-118)/(60-95) 103/68 (03/08 1200) SpO2:  [99 %-100 %] 100 % (03/08 1537) Arterial Line BP: (81-137)/(43-70) 118/66 (03/08 1300) FiO2 (%):  [40 %-50 %] 40 % (03/08 1537) Last BM Date: 08/07/2019  Intake/Output from previous day: 03/07 0701 - 03/08 0700 In: 6978.1 [P.O.:50; I.V.:3983.6; Blood:1719.9; IV Piggyback:1224.6] Out: 4925 [Urine:2075; Emesis/NG output:2600; Stool:50; Blood:200] Intake/Output this shift: Total I/O In: 1228.3 [I.V.:503.1; IV Piggyback:725.3] Out: 1150 [Urine:750; Emesis/NG output:400]  General appearance: responsive to questions with nods, no distress, on vent. Head: NG tube in place, ETT in place, on vent.  Eyes: movement in response to questions. Skin: No evaluation today due to recent photos and return to OR tomorrow for additional debridement.    Lab Results:  CBC Latest Ref Rng & Units 07/31/2019 08/10/2019 08/12/2019  WBC 4.0 - 10.5 K/uL 21.8(H) 15.8(H) -  Hemoglobin 13.0 - 17.0 g/dL 10.1(L) 10.6(L) 8.5(L)  Hematocrit 39.0 - 52.0 % 30.7(L) 33.4(L) 25.0(L)  Platelets 150 - 400 K/uL 52(L) 28(LL) -    BMET Recent Labs    08/05/2019 1906 08/02/2019 1906 07/20/2019 0418 08/06/2019 1325  NA 141  --  140  --   K 1.5*   < > 2.4* 2.5*  CL 117*  --  114*  --   CO2 18*  --  17*  --   GLUCOSE 115*  --  125*  --   BUN 18  --  19  --   CREATININE <0.30*  --  <0.30*  --   CALCIUM 6.6*  --  7.4*  --    < > = values in this interval not displayed.   PT/INR Recent Labs    07/23/2019 0008 07/31/2019 0418   LABPROT 21.3* 21.0*  INR 1.9* 1.8*   ABG Recent Labs    08/13/2019 1730 08/11/2019 0723  PHART 7.331* 7.398  HCO3 17.6* 15.6*    Studies/Results: CT ABDOMEN PELVIS W CONTRAST  Result Date: 07/31/2019 CLINICAL DATA:  54 year old male with history of abdominal pain and fever. Postoperative wounds. Shock. Evaluate for abscess formation. EXAM: CT ABDOMEN AND PELVIS WITH CONTRAST TECHNIQUE: Multidetector CT imaging of the abdomen and pelvis was performed using the standard protocol following bolus administration of intravenous contrast. CONTRAST:  169mL OMNIPAQUE IOHEXOL 300 MG/ML  SOLN COMPARISON:  CT the abdomen and pelvis 05/20/2019. FINDINGS: Lower chest: Moderate right and small left pleural effusions lying dependently. Intermediate attenuation debris lying dependently in the posterior aspect of the right pleural space, similar in retrospect to the prior examination. Extensive areas of passive atelectasis in the lower lobes of the lungs bilaterally. Hepatobiliary: No suspicious cystic or solid hepatic lesions. No intra or extrahepatic biliary ductal dilatation. Status post cholecystectomy. Pancreas: No pancreatic mass. No pancreatic ductal dilatation. No pancreatic or peripancreatic fluid collections or inflammatory changes. Spleen: Unremarkable. Adrenals/Urinary Tract: Subcentimeter low-attenuation lesion in the interpolar region of the left kidney, too small to characterize, but statistically likely to represent a cyst. Right kidney and bilateral adrenal glands are normal in appearance. No hydroureteronephrosis. Urinary  bladder is completely decompressed around an indwelling suprapubic catheter. Stomach/Bowel: Stomach is severely distended with a large air-fluid level. No pathologic dilatation of small bowel or colon. Transverse colostomy in the left lower quadrant. Large amount of inspissated stool noted in the rectal vault. Vascular/Lymphatic: Atherosclerotic calcifications in the pelvic vasculature,  without evidence of aneurysm or dissection in the abdominal or pelvic vasculature. Prominent borderline enlarged and mildly enlarged pelvic lymph nodes bilaterally, largest of which is a 1.5 cm left external iliac lymph node (axial image 85 of series 3), similar to the prior examination, likely chronic and reactive. No abdominal lymphadenopathy. Reproductive: Prostate gland and seminal vesicles are unremarkable in appearance. Other: Trace volume of ascites. Mild diffuse body wall edema. No pneumoperitoneum. Musculoskeletal: Deep sacral decubitus ulcer extending to the bone. 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 Deep soft tissue ulcer in the right flank and right lateral abdominal wall, new compared to the prior examination. Extensive soft tissue gas tracking in the right flank cephalad, ultimately tracking posterior to the upper lumbar and lower thoracic spine, concerning for potential necrotizing fasciitis. Extensive irregular areas of sclerosis throughout the pelvic bones bilaterally, compatible with areas of chronic osteomyelitis. Large expansile lesion measuring 6.3 x 3.9 cm in the posterior aspect of the right fifth rib, incompletely imaged, but similar compared to prior CT the chest 05/20/2019. IMPRESSION: 1. Chronic areas of osteomyelitis, bilateral septic arthritis in the hip joints, deep decubitus ulcers overlying the sacrum and right hip joint, and new deep soft tissue ulcers in the right lateral abdominal wall and right flank, with new soft tissue gas extending in the subcutaneous soft tissues of the right flank and paraspinal regions, as detailed above. Findings are highly concerning for developing necrotizing fasciitis in these regions. 2. Massive gastric distension. No pathologic distention of small bowel or colon. This could suggest gastric outlet obstruction or gastroparesis. 3. Trace volume of  ascites and diffuse body wall edema with bilateral pleural effusions; imaging findings suggestive of a state of anasarca. 4. Moderate right and small left pleural effusions lying dependently with some debris lying dependently in the posterior aspect of the right pleural space. 5. Additional incidental findings, as above, similar to the prior study. These results will be called to the ordering clinician or representative by the Radiologist Assistant, and communication documented in the PACS or zVision Dashboard. Electronically Signed   By: Vinnie Langton M.D.   On: 07/29/2019 12:00   DG CHEST PORT 1 VIEW  Result Date: 08/03/2019 CLINICAL DATA:  Acute respiratory failure. Endotracheally intubated. Fever and tachycardia. Quadriplegia. EXAM: PORTABLE CHEST 1 VIEW COMPARISON:  Prior today FINDINGS: The endotracheal tube tube has been pulled back, with tip now 6.5 cm above the carina. Other support lines and tubes remain in stable position. No pneumothorax visualized. Cardiomegaly, diffuse bilateral pulmonary airspace disease, and small layering bilateral pleural effusions show no significant change. IMPRESSION: 1. Endotracheal tube tip now 6.5 cm above the carina. 2. Stable cardiomegaly, diffuse bilateral pulmonary airspace disease, and small layering bilateral pleural effusions. Electronically Signed   By: Marlaine Hind M.D.   On: 07/26/2019 19:16   DG CHEST PORT 1 VIEW  Result Date: 07/22/2019 CLINICAL DATA:  54 year old male status post intubation. EXAM: PORTABLE CHEST 1 VIEW COMPARISON:  Chest radiograph dated 07/22/2019. FINDINGS: Endotracheal tube with tip at the level of the carina tilting towards the right mainstem bronchus. Recommend  retraction by approximately 4 cm. Right IJ central venous line with tip over central SVC. Left IJ central venous line with tip in the left innominate vein. Enteric tube extends below the diaphragm with tip beyond the inferior margin of the image. There is cardiomegaly with  vascular congestion and edema. Small to moderate bilateral pleural effusions noted. Pneumonia is not excluded. Clinical correlation is recommended. No pneumothorax. No acute osseous pathology. IMPRESSION: 1. Endotracheal tube at the level of the carina tilting towards the right mainstem bronchus. Recommend retraction by approximately 4 cm. 2. Cardiomegaly with findings of CHF and bilateral pleural effusions. Pneumonia is not excluded. These results were called by telephone at the time of interpretation on 07/30/2019 at 5:19 pm to provider [facility] , who verbally acknowledged these results. Electronically Signed   By: Anner Crete M.D.   On: 08/14/2019 17:32   DG Abd Portable 1V  Result Date: 07/31/2019 CLINICAL DATA:  Bedside nasogastric tube placement. EXAM: PORTABLE ABDOMEN - 1 VIEW COMPARISON:  05/16/2019. FINDINGS: The nasogastric tube tip is in the mid to distal body of the stomach. Visualized bowel gas pattern likely represents a mild ileus. IMPRESSION: Nasogastric tube tip in the mid to distal body of the stomach. Electronically Signed   By: Evangeline Dakin M.D.   On: 08/02/2019 12:41    Anti-infectives: Anti-infectives (From admission, onward)   Start     Dose/Rate Route Frequency Ordered Stop   08/13/2019 1000  linezolid (ZYVOX) IVPB 600 mg     600 mg 300 mL/hr over 60 Minutes Intravenous Every 12 hours 07/30/2019 0831     08/02/2019 0600  clindamycin (CLEOCIN) IVPB 900 mg  Status:  Discontinued     900 mg 100 mL/hr over 30 Minutes Intravenous On call to O.R. 07/19/2019 1320 08/09/2019 1321   08/04/2019 0600  gentamicin (GARAMYCIN) 360 mg in dextrose 5 % 100 mL IVPB  Status:  Discontinued     5 mg/kg  71.2 kg 109 mL/hr over 60 Minutes Intravenous On call to O.R. 08/09/2019 1320 07/20/2019 1321   07/17/2019 0000  vancomycin (VANCOREADY) IVPB 1250 mg/250 mL  Status:  Discontinued     1,250 mg 166.7 mL/hr over 90 Minutes Intravenous Every 12 hours 07/23/2019 1246 08/09/2019 0831   07/25/2019 1400   vancomycin (VANCOREADY) IVPB 1500 mg/300 mL     1,500 mg 150 mL/hr over 120 Minutes Intravenous  Once 07/23/2019 1230 07/20/2019 1600   08/12/2019 1400  piperacillin-tazobactam (ZOSYN) IVPB 3.375 g     3.375 g 12.5 mL/hr over 240 Minutes Intravenous Every 8 hours 07/30/2019 1321     08/05/2019 1230  vancomycin (VANCOCIN) IVPB 1000 mg/200 mL premix  Status:  Discontinued     1,000 mg 200 mL/hr over 60 Minutes Intravenous  Once 08/11/2019 1225 07/28/2019 1230   07/19/19 0400  vancomycin (VANCOREADY) IVPB 1250 mg/250 mL  Status:  Discontinued     1,250 mg 166.7 mL/hr over 90 Minutes Intravenous Every 12 hours 07/18/19 1457 07/19/19 1011   07/18/19 1600  vancomycin (VANCOREADY) IVPB 1500 mg/300 mL     1,500 mg 150 mL/hr over 120 Minutes Intravenous  Once 07/18/19 1457 07/18/19 1745   07/18/19 0200  ceFEPIme (MAXIPIME) 2 g in sodium chloride 0.9 % 100 mL IVPB  Status:  Discontinued     2 g 200 mL/hr over 30 Minutes Intravenous Every 8 hours 07/18/19 0054 07/20/2019 1321   08/08/2019 1715  ceFEPIme (MAXIPIME) 2 g in sodium chloride 0.9 % 100 mL IVPB  2 g 200 mL/hr over 30 Minutes Intravenous  Once 07/21/2019 1707 08/14/2019 1818   08/05/2019 1715  metroNIDAZOLE (FLAGYL) IVPB 500 mg     500 mg 100 mL/hr over 60 Minutes Intravenous  Once 08/02/2019 1707 07/15/2019 1900      Assessment/Plan:  Patient returning to OR tomorrow for debridement by general surgery. Will continue to follow - no surgical management from plastic surgery planned at this time. Will monitor status and be available for assistance as needed. [name] spoke with Dr. Ninfa Linden today in regards to patient status.  Call with questions or concerns.   LOS: 5 days    Charlies Constable, PA-C 07/24/2019

## 2019-07-22 NOTE — Progress Notes (Signed)
CRITICAL VALUE ALERT  Critical Value:  Potassium 2.5  Date & Time Notied:  07/17/2019  1525  Provider Notified: Dr. Tamala Julian  Orders Received/Actions taken: Orders received for potassium replacement

## 2019-07-22 NOTE — Progress Notes (Signed)
Oozing and bleeding noted at this time. Will hold CPT until this has resolved.
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
## 2019-07-22 NOTE — Progress Notes (Signed)
[facility] for Infectious Disease   Reason for visit: Follow up on wet gangrene, nec fasciitis  Interval History: remains intubated and sedated.  WBC 21.8, afebirle.  ON pressor support. linezolid started today Total antibiotics days 6   Physical Exam: Constitutional:  Vitals:   07/30/2019 1200 07/26/2019 1300  BP: 103/68   Pulse: 75 69  Resp: 18 18  Temp: (!) 97.3 F (36.3 C)   SpO2: 100% 100%   patient is sedated Eyes: anicteric HENT: +ET Respiratory: respiratory effort on vent Cardiovascular: RRR   Review of Systems: Unable to be assessed due to patient factors  Lab Results  Component Value Date   WBC 21.8 (H) 08/13/2019   HGB 10.1 (L) 08/14/2019   HCT 30.7 (L) 08/11/2019   MCV 82.1 07/21/2019   PLT 52 (L) 07/24/2019    Lab Results  Component Value Date   CREATININE <0.30 (L) 08/08/2019   BUN 19 07/21/2019   NA 140 07/17/2019   K 2.4 (LL) 07/31/2019   CL 114 (H) 07/30/2019   CO2 17 (L) 07/18/2019    Lab Results  Component Value Date   ALT 11 07/25/2019   AST 14 (L) 07/31/2019   ALKPHOS 81 08/13/2019     Microbiology: Recent Results (from the past 240 hour(s))  SARS CORONAVIRUS 2 (TAT 6-24 HRS) Nasopharyngeal Nasopharyngeal Swab     Status: None   Collection Time: 07/20/2019  7:00 PM   Specimen: Nasopharyngeal Swab  Result Value Ref Range Status   SARS Coronavirus 2 NEGATIVE NEGATIVE Final    Comment: (NOTE) SARS-CoV-2 target nucleic acids are NOT DETECTED. The SARS-CoV-2 RNA is generally detectable in upper and lower respiratory specimens during the acute phase of infection. Negative results do not preclude SARS-CoV-2 infection, do not rule out co-infections with other pathogens, and should not be used as the sole basis for treatment or other patient management decisions. Negative results must be combined with clinical observations, patient history, and epidemiological information. The expected result is Negative. Fact Sheet for  Patients: SugarRoll.be Fact Sheet for Healthcare Providers: https://www.woods-mathews.com/ This test is not yet approved or cleared by the [place] FDA and  has been authorized for detection and/or diagnosis of SARS-CoV-2 by FDA under an Emergency Use Authorization (EUA). This EUA will remain  in effect (meaning this test can be used) for the duration of the COVID-19 declaration under Section 56 4(b)(1) of the Act, 21 U.S.C. section 360bbb-3(b)(1), unless the authorization is terminated or revoked sooner. Performed at [facility], [place] [address], [place], [place] [address]   Blood Culture (routine x 2)     Status: None   Collection Time: 07/25/2019  7:26 PM   Specimen: BLOOD LEFT HAND  Result Value Ref Range Status   Specimen Description   Final    BLOOD LEFT HAND BOTTLES DRAWN AEROBIC ONLY Blood Culture results may not be optimal due to an inadequate volume of blood received in culture bottles Performed at [facility], [place] [address], [place], [place] [address]    Special Requests   Final    NONE Performed at [facility], [place] [address], [place], [place] [address]    Culture   Final    NO GROWTH 5 DAYS Performed at [facility], [place] [address], [place], [place] [address]    Report Status 07/22/2019 FINAL  Final  Urine Culture     Status: Abnormal   Collection Time: 07/23/2019  7:58 PM  Specimen: Urine, Clean Catch  Result Value Ref Range Status   Specimen Description   Final    URINE, CLEAN CATCH Performed at [facility], [place] [address] [place], [place] [address]    Special Requests   Final    NONE Performed at [facility], [place] [address] [place], [place] [address]    Culture MULTIPLE SPECIES PRESENT, SUGGEST RECOLLECTION (A)  Final   Report Status 07/19/2019 FINAL  Final  MRSA PCR Screening     Status: Abnormal   Collection Time:  07/18/19  7:59 AM   Specimen: Nasal Mucosa; Nasopharyngeal  Result Value Ref Range Status   MRSA by PCR POSITIVE (A) NEGATIVE Final    Comment:        The GeneXpert MRSA Assay (FDA approved for NASAL specimens only), is one component of a comprehensive MRSA colonization surveillance program. It is not intended to diagnose MRSA infection nor to guide or monitor treatment for MRSA infections. RESULT CALLED TO, READ BACK BY AND VERIFIED WITH: ROMINS, H. @ [id number] 07/18/2019 PERRY, J. Performed at [facility], [place] [address] [place], [place] [address]   Aerobic/Anaerobic Culture (surgical/deep wound)     Status: None (Preliminary result)   Collection Time: 07/15/2019  4:01 PM   Specimen: PATH Cytology Misc. fluid; Body Fluid  Result Value Ref Range Status   Specimen Description   Final    ABSCESS Performed at [facility] [address], [place], [place] [address]    Special Requests FLANK  Final   Gram Stain   Final    RARE WBC PRESENT, PREDOMINANTLY PMN ABUNDANT GRAM NEGATIVE RODS MODERATE GRAM POSITIVE COCCI IN PAIRS IN CLUSTERS MODERATE GRAM POSITIVE RODS    Culture   Final    TOO YOUNG TO READ Performed at [facility], [place] [address], [place], [place] [address]    Report Status PENDING  Incomplete    Impression/Plan:  1. Nec fasciitis - on broad antibiotic coverage and followed by surgery.  No positive cultures yet to date.  Back to the OR tomorrow.    2.  Leukocytosis - from #1 above.  Will continue to monitor  3.  Septic shock - from #1 and remains on pressor support.

## 2019-07-22 NOTE — Progress Notes (Signed)
RT will hold CPT due to wounds on back.  RT to monitor and assess as needed.

## 2019-07-22 NOTE — Progress Notes (Signed)
I will change vancomycin to linezolid for continued MRSA coverage with addition of anti toxin coverage.   Thayer Headings, MD

## 2019-07-23 ENCOUNTER — Inpatient Hospital Stay (HOSPITAL_COMMUNITY): Payer: Medicare Other | Admitting: Certified Registered Nurse Anesthetist

## 2019-07-23 ENCOUNTER — Encounter (HOSPITAL_COMMUNITY): Admission: EM | Disposition: E | Payer: Self-pay | Source: Home / Self Care | Attending: Internal Medicine

## 2019-07-23 HISTORY — PX: WOUND DEBRIDEMENT: SHX247

## 2019-07-23 LAB — BASIC METABOLIC PANEL
Anion gap: 6 (ref 5–15)
BUN: 19 mg/dL (ref 6–20)
CO2: 18 mmol/L — ABNORMAL LOW (ref 22–32)
Calcium: 7.5 mg/dL — ABNORMAL LOW (ref 8.9–10.3)
Chloride: 120 mmol/L — ABNORMAL HIGH (ref 98–111)
Creatinine, Ser: <0.3 mg/dL — ABNORMAL LOW (ref 0.61–1.24)
Glucose, Bld: 129 mg/dL — ABNORMAL HIGH (ref 70–99)
Potassium: 4.6 mmol/L (ref 3.5–5.1)
Sodium: 144 mmol/L (ref 135–145)

## 2019-07-23 LAB — CBC
HCT: 30.9 % — ABNORMAL LOW (ref 39.0–52.0)
Hemoglobin: 10.2 g/dL — ABNORMAL LOW (ref 13.0–17.0)
MCH: 26.3 pg (ref 26.0–34.0)
MCHC: 33 g/dL (ref 30.0–36.0)
MCV: 79.6 fL — ABNORMAL LOW (ref 80.0–100.0)
Platelets: 27 10*3/uL — CL (ref 150–400)
RBC: 3.88 MIL/uL — ABNORMAL LOW (ref 4.22–5.81)
RDW: 17.4 % — ABNORMAL HIGH (ref 11.5–15.5)
WBC: 11.2 10*3/uL — ABNORMAL HIGH (ref 4.0–10.5)
nRBC: 0.2 % (ref 0.0–0.2)

## 2019-07-23 LAB — GLUCOSE, CAPILLARY
Glucose-Capillary: 107 mg/dL — ABNORMAL HIGH (ref 70–99)
Glucose-Capillary: 109 mg/dL — ABNORMAL HIGH (ref 70–99)
Glucose-Capillary: 111 mg/dL — ABNORMAL HIGH (ref 70–99)
Glucose-Capillary: 126 mg/dL — ABNORMAL HIGH (ref 70–99)
Glucose-Capillary: 137 mg/dL — ABNORMAL HIGH (ref 70–99)
Glucose-Capillary: 145 mg/dL — ABNORMAL HIGH (ref 70–99)

## 2019-07-23 LAB — AEROBIC/ANAEROBIC CULTURE W GRAM STAIN (SURGICAL/DEEP WOUND)

## 2019-07-23 LAB — SURGICAL PATHOLOGY

## 2019-07-23 LAB — PHOSPHORUS: Phosphorus: 1.7 mg/dL — ABNORMAL LOW (ref 2.5–4.6)

## 2019-07-23 LAB — POTASSIUM: Potassium: 4.6 mmol/L (ref 3.5–5.1)

## 2019-07-23 LAB — MAGNESIUM: Magnesium: 1.9 mg/dL (ref 1.7–2.4)

## 2019-07-23 SURGERY — DEBRIDEMENT, WOUND
Anesthesia: General | Site: Back

## 2019-07-23 MED ORDER — ONDANSETRON HCL 4 MG/2ML IJ SOLN
INTRAMUSCULAR | Status: DC | PRN
  Administered 2019-07-23: 4 mg via INTRAVENOUS

## 2019-07-23 MED ORDER — 0.9 % SODIUM CHLORIDE (POUR BTL) OPTIME
TOPICAL | Status: DC | PRN
  Administered 2019-07-23: 12:00:00 1000 mL

## 2019-07-23 MED ORDER — ONDANSETRON HCL 4 MG/2ML IJ SOLN
INTRAMUSCULAR | Status: AC
  Filled 2019-07-23: qty 2

## 2019-07-23 MED ORDER — PHENYLEPHRINE 40 MCG/ML (10ML) SYRINGE FOR IV PUSH (FOR BLOOD PRESSURE SUPPORT)
PREFILLED_SYRINGE | INTRAVENOUS | Status: AC
  Filled 2019-07-23: qty 10

## 2019-07-23 MED ORDER — MIDAZOLAM HCL 2 MG/2ML IJ SOLN
INTRAMUSCULAR | Status: AC
  Filled 2019-07-23: qty 2

## 2019-07-23 MED ORDER — BUPIVACAINE-EPINEPHRINE 0.5% -1:200000 IJ SOLN
INTRAMUSCULAR | Status: AC
  Filled 2019-07-23: qty 1

## 2019-07-23 MED ORDER — PHENYLEPHRINE 40 MCG/ML (10ML) SYRINGE FOR IV PUSH (FOR BLOOD PRESSURE SUPPORT)
PREFILLED_SYRINGE | INTRAVENOUS | Status: AC
  Filled 2019-07-23: qty 30

## 2019-07-23 MED ORDER — SODIUM CHLORIDE 0.9% IV SOLUTION: Freq: Once | INTRAVENOUS | Status: AC

## 2019-07-23 MED ORDER — ARTIFICIAL TEARS OPHTHALMIC OINT
TOPICAL_OINTMENT | OPHTHALMIC | Status: AC
  Filled 2019-07-23: qty 3.5

## 2019-07-23 MED ORDER — ROCURONIUM BROMIDE 50 MG/5ML IV SOSY
PREFILLED_SYRINGE | INTRAVENOUS | Status: DC | PRN
  Administered 2019-07-23: 30 mg via INTRAVENOUS

## 2019-07-23 MED ORDER — ROCURONIUM BROMIDE 10 MG/ML (PF) SYRINGE
PREFILLED_SYRINGE | INTRAVENOUS | Status: AC
  Filled 2019-07-23: qty 10

## 2019-07-23 MED ORDER — EPHEDRINE 5 MG/ML INJ
INTRAVENOUS | Status: AC
  Filled 2019-07-23: qty 10

## 2019-07-23 MED ORDER — SODIUM PHOSPHATES 45 MMOLE/15ML IV SOLN
30.0000 mmol | Freq: Once | INTRAVENOUS | Status: AC
  Administered 2019-07-23: 30 mmol via INTRAVENOUS
  Filled 2019-07-23: qty 10

## 2019-07-23 MED ORDER — LACTATED RINGERS IV SOLN: INTRAVENOUS | Status: DC | PRN

## 2019-07-23 MED ORDER — PHENYLEPHRINE 40 MCG/ML (10ML) SYRINGE FOR IV PUSH (FOR BLOOD PRESSURE SUPPORT)
PREFILLED_SYRINGE | INTRAVENOUS | Status: DC | PRN
  Administered 2019-07-23 (×2): 160 ug via INTRAVENOUS
  Administered 2019-07-23: 120 ug via INTRAVENOUS

## 2019-07-23 MED ORDER — PROPOFOL 10 MG/ML IV BOLUS
INTRAVENOUS | Status: AC
  Filled 2019-07-23: qty 20

## 2019-07-23 SURGICAL SUPPLY — 43 items
ADH SKN CLS APL DERMABOND .7 (GAUZE/BANDAGES/DRESSINGS)
BLADE HEX COATED 2.75 (ELECTRODE) ×2 IMPLANT
BLADE SURG SZ10 CARB STEEL (BLADE) ×2 IMPLANT
BNDG GAUZE ELAST 4 BULKY (GAUZE/BANDAGES/DRESSINGS) ×2 IMPLANT
COVER SURGICAL LIGHT HANDLE (MISCELLANEOUS) ×2 IMPLANT
COVER WAND RF STERILE (DRAPES) IMPLANT
DECANTER SPIKE VIAL GLASS SM (MISCELLANEOUS) IMPLANT
DERMABOND ADVANCED (GAUZE/BANDAGES/DRESSINGS)
DERMABOND ADVANCED .7 DNX12 (GAUZE/BANDAGES/DRESSINGS) IMPLANT
DRAPE LAPAROSCOPIC ABDOMINAL (DRAPES) ×1 IMPLANT
DRAPE LAPAROTOMY T 102X78X121 (DRAPES) IMPLANT
DRAPE LAPAROTOMY TRNSV 102X78 (DRAPES) IMPLANT
DRAPE SHEET LG 3/4 BI-LAMINATE (DRAPES) IMPLANT
DRSG PAD ABDOMINAL 8X10 ST (GAUZE/BANDAGES/DRESSINGS) ×10 IMPLANT
ELECT PENCIL ROCKER SW 15FT (MISCELLANEOUS) ×1 IMPLANT
ELECT REM PT RETURN 15FT ADLT (MISCELLANEOUS) ×2 IMPLANT
GAUZE SPONGE 4X4 12PLY STRL (GAUZE/BANDAGES/DRESSINGS) ×6 IMPLANT
GLOVE BIO SURGEON STRL SZ7 (GLOVE) ×6 IMPLANT
GLOVE BIOGEL PI IND STRL 7.0 (GLOVE) ×1 IMPLANT
GLOVE BIOGEL PI IND STRL 7.5 (GLOVE) ×3 IMPLANT
GLOVE BIOGEL PI INDICATOR 7.0 (GLOVE) ×1
GLOVE BIOGEL PI INDICATOR 7.5 (GLOVE) ×3
GLOVE SURG SIGNA 7.5 PF LTX (GLOVE) ×2 IMPLANT
GOWN STRL REUS W/ TWL XL LVL3 (GOWN DISPOSABLE) ×1 IMPLANT
GOWN STRL REUS W/TWL LRG LVL3 (GOWN DISPOSABLE) ×4 IMPLANT
GOWN STRL REUS W/TWL XL LVL3 (GOWN DISPOSABLE) ×3 IMPLANT
KIT BASIN OR (CUSTOM PROCEDURE TRAY) ×2 IMPLANT
KIT TURNOVER KIT A (KITS) ×1 IMPLANT
MARKER SKIN DUAL TIP RULER LAB (MISCELLANEOUS) ×2 IMPLANT
NDL HYPO 25X1 1.5 SAFETY (NEEDLE) ×1 IMPLANT
NEEDLE HYPO 25X1 1.5 SAFETY (NEEDLE) ×2 IMPLANT
NS IRRIG 1000ML POUR BTL (IV SOLUTION) ×2 IMPLANT
PACK BASIC VI WITH GOWN DISP (CUSTOM PROCEDURE TRAY) ×2 IMPLANT
PENCIL SMOKE EVACUATOR (MISCELLANEOUS) IMPLANT
SPONGE LAP 18X18 RF (DISPOSABLE) ×1 IMPLANT
SPONGE LAP 4X18 RFD (DISPOSABLE) IMPLANT
STAPLER VISISTAT 35W (STAPLE) IMPLANT
SUT MNCRL AB 4-0 PS2 18 (SUTURE) IMPLANT
SUT VIC AB 3-0 SH 18 (SUTURE) IMPLANT
SYR CONTROL 10ML LL (SYRINGE) ×2 IMPLANT
TAPE CLOTH SURG 6X10 WHT LF (GAUZE/BANDAGES/DRESSINGS) ×1 IMPLANT
TOWEL OR 17X26 10 PK STRL BLUE (TOWEL DISPOSABLE) ×2 IMPLANT
YANKAUER SUCT BULB TIP 10FT TU (MISCELLANEOUS) ×2 IMPLANT
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
## 2019-07-23 NOTE — Op Note (Signed)
EXCISIONAL DEBRIDEMENT OF BACK AND FLANK WOUND  Procedure Note  EARLY KONDOR 08/04/2019 - 08/12/2019   Pre-op Diagnosis: NECROTIZING FASCIITIS     Post-op Diagnosis: same  Procedure(s): EXCISIONAL DEBRIDEMENT OF BACK WOUND (100 cm square of skin and subcutaneous tissue sharply debrided)  Surgeon(s): Coralie Keens, MD  Anesthesia: General  Staff:  Circulator: Joesphine Bare, RN Scrub Person: Gertie Baron, RN  Estimated Blood Loss: Minimal               Indications: This is a quadriplegic who is postop day 2 status post extensive debridement of necrotizing fasciitis involving the back.  He has had extensive surgery and multiple open wounds over the years.  He came in 2 days ago with septic shock and was taken to the operating room for debridement.  He returns today to the operating room for  dressing change and possible further debridement.  Findings: There was necrosis of skin and subcutaneous tissue on the back adjacent to the larger wounds.  All other wounds are clean without evidence of ongoing necrotizing fasciitis.  Procedure: The patient was brought to the operating room for the intensive care unit.  He was already on the ventilator.  General anesthesia was induced.  He was then placed in the prone position.  I removed all the dressings from his large open wounds.  I examined the wound extensively and only saw necrosis of skin of the back.  I sharply excised a 100 cm area of skin of his back and subcutaneous tissue with a scalpel.  I did not appear to be any further necrotizing fasciitis involving the wounds on the back or flanks.  There was no gross purulence and no further necrotic fat.  I achieved hemostasis with cautery.  All the wounds were then repacked with wet and dry saline soaked gauze.  Dry gauze and ABDs were placed over this.  The patient was then placed back into the supine position and taken still intubated back to the intensive care unit.          Coralie Keens   Date: 07/28/2019  Time: 1:07 PM

## 2019-07-23 NOTE — Progress Notes (Signed)
Patient ID: [name] [name], male   DOB: 03/21/1966, 54 y.o.   MRN: [id number]   Plan to return to the OR this morning for dressing change and re-evaluation on the wounds to determine whether further debridement is necessary.  Plt count is 27k.  Platelets are being transfused.  Patient remains on vent.  Pre Procedure note for inpatients:   [name] [name] has been scheduled for Procedure(s): INCISION AND Debridement necritizing fascaititis back,flank, and hip  (N/A) today. The various methods of treatment have been discussed.  After consideration of the risks, benefits and treatment options the patient has consented to the planned procedure.   The patient has been seen and labs reviewed. There are no changes in the patient's condition to prevent proceeding with the planned procedure today.  Recent labs:  Lab Results  Component Value Date   WBC 11.2 (H) 07/25/2019   HGB 10.2 (L) 07/31/2019   HCT 30.9 (L) 08/06/2019   PLT 27 (LL) 07/31/2019   GLUCOSE 129 (H) 07/25/2019   CHOL  10/22/2007    171        ATP III CLASSIFICATION:  <200     mg/dL   Desirable  200-239  mg/dL   Borderline High  >=240    mg/dL   High   TRIG 76 05/09/2018   HDL 49 10/22/2007   LDLCALC  10/22/2007    99        Total Cholesterol/HDL:CHD Risk Coronary Heart Disease Risk Table                     Men   Women  1/2 Average Risk   3.4   3.3   ALT 11 08/13/2019   AST 14 (L) 07/20/2019   NA 144 07/20/2019   K 4.6 07/21/2019   CL 120 (H) 08/07/2019   CREATININE <0.30 (L) 07/16/2019   BUN 19 07/15/2019   CO2 18 (L) 07/25/2019   TSH 0.748 06/19/2019   INR 1.8 (H) 07/22/2019   HGBA1C 5.9 (H) 06/02/2015    Coralie Keens, MD 07/31/2019 7:45 AM

## 2019-07-23 NOTE — Progress Notes (Signed)
ETT holder changed; no complications with change.

## 2019-07-23 NOTE — Progress Notes (Signed)
Cpt not done at this time due to wounds.

## 2019-07-23 NOTE — Transfer of Care (Signed)
Immediate Anesthesia Transfer of Care Note  Patient: [name] [name]  Procedure(s) Performed: EXCISIONAL DEBRIDEMENT OF BACK AND FLANK WOUND (N/A Back)  Patient Location: PACU and ICU  Anesthesia Type:General  Level of Consciousness: sedated and Patient remains intubated per anesthesia plan  Airway & Oxygen Therapy: Patient remains intubated per anesthesia plan  Post-op Assessment: Report given to RN and Post -op Vital signs reviewed and stable  Post vital signs: Reviewed and stable  Last Vitals:  Vitals Value Taken Time  BP    Temp    Pulse    Resp    SpO2      Last Pain:  Vitals:   08/04/2019 1035  TempSrc: Axillary  PainSc:          Complications: No apparent anesthesia complications
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
## 2019-07-23 NOTE — Progress Notes (Signed)
[facility] Progress Note Patient Name: [name] [name] DOB: 05-16-1966 MRN: [id number]   Date of Service  07/24/2019  HPI/Events of Note  Multiple issues: 1. Patient going to OR today. Platelets = 27K and 2. PO4--- = 1.7.  eICU Interventions  Will order: 1. Transfuse 1 unit single donor platelets. 2. Replace PO4---.     Intervention Category Major Interventions: Electrolyte abnormality - evaluation and management Intermediate Interventions: Thrombocytopenia - evaluation and management  Jennell Janosik Cornelia Copa 07/31/2019, 6:21 AM

## 2019-07-23 NOTE — Anesthesia Postprocedure Evaluation (Signed)
Anesthesia Post Note  Patient: [name] [name]  Procedure(s) Performed: EXCISIONAL DEBRIDEMENT OF BACK AND FLANK WOUND (N/A Back)     Patient location during evaluation: ICU Anesthesia Type: General Level of consciousness: sedated Pain management: pain level controlled Vital Signs Assessment: post-procedure vital signs reviewed and stable Respiratory status: patient remains intubated per anesthesia plan Cardiovascular status: stable Postop Assessment: no apparent nausea or vomiting Anesthetic complications: no    Last Vitals:  Vitals:   07/17/2019 1515 07/31/2019 1542  BP:  (!) 91/57  Pulse: 77 87  Resp: 18 18  Temp:    SpO2: 100% 100%    Last Pain:  Vitals:   07/30/2019 1200  TempSrc: Oral  PainSc:                  Saira Kramme P [place]

## 2019-07-24 ENCOUNTER — Inpatient Hospital Stay (HOSPITAL_COMMUNITY): Payer: Medicare Other

## 2019-07-24 ENCOUNTER — Encounter: Payer: Self-pay | Admitting: *Deleted

## 2019-07-24 DIAGNOSIS — D696 Thrombocytopenia, unspecified: Secondary | ICD-10-CM

## 2019-07-24 LAB — BPAM PLATELET PHERESIS
Blood Product Expiration Date: 202103100639
ISSUE DATE / TIME: 202103090657
Unit Type and Rh: 7300

## 2019-07-24 LAB — BASIC METABOLIC PANEL
Anion gap: 9 (ref 5–15)
BUN: 15 mg/dL (ref 6–20)
CO2: 18 mmol/L — ABNORMAL LOW (ref 22–32)
Calcium: 7.1 mg/dL — ABNORMAL LOW (ref 8.9–10.3)
Chloride: 116 mmol/L — ABNORMAL HIGH (ref 98–111)
Creatinine, Ser: <0.3 mg/dL — ABNORMAL LOW (ref 0.61–1.24)
Glucose, Bld: 104 mg/dL — ABNORMAL HIGH (ref 70–99)
Potassium: 3.2 mmol/L — ABNORMAL LOW (ref 3.5–5.1)
Sodium: 143 mmol/L (ref 135–145)

## 2019-07-24 LAB — PREPARE PLATELET PHERESIS: Unit division: 0

## 2019-07-24 LAB — GLUCOSE, CAPILLARY
Glucose-Capillary: 106 mg/dL — ABNORMAL HIGH (ref 70–99)
Glucose-Capillary: 98 mg/dL (ref 70–99)
Glucose-Capillary: 111 mg/dL — ABNORMAL HIGH (ref 70–99)
Glucose-Capillary: 97 mg/dL (ref 70–99)
Glucose-Capillary: 111 mg/dL — ABNORMAL HIGH (ref 70–99)

## 2019-07-24 LAB — CBC
HCT: 27.3 % — ABNORMAL LOW (ref 39.0–52.0)
Hemoglobin: 9 g/dL — ABNORMAL LOW (ref 13.0–17.0)
MCH: 26.5 pg (ref 26.0–34.0)
MCHC: 33 g/dL (ref 30.0–36.0)
MCV: 80.3 fL (ref 80.0–100.0)
Platelets: 22 10*3/uL — CL (ref 150–400)
RBC: 3.4 MIL/uL — ABNORMAL LOW (ref 4.22–5.81)
RDW: 17.9 % — ABNORMAL HIGH (ref 11.5–15.5)
WBC: 10.5 10*3/uL (ref 4.0–10.5)
nRBC: 0 % (ref 0.0–0.2)

## 2019-07-24 LAB — MAGNESIUM: Magnesium: 1.4 mg/dL — ABNORMAL LOW (ref 1.7–2.4)

## 2019-07-24 LAB — LACTIC ACID, PLASMA: Lactic Acid, Venous: 1.3 mmol/L (ref 0.5–1.9)

## 2019-07-24 LAB — PHOSPHORUS: Phosphorus: 3 mg/dL (ref 2.5–4.6)

## 2019-07-24 MED ORDER — MIDODRINE HCL 5 MG PO TABS
20.0000 mg | ORAL_TABLET | Freq: Three times a day (TID) | ORAL | Status: DC
  Administered 2019-07-24: 20 mg
  Filled 2019-07-24: qty 4

## 2019-07-24 MED ORDER — FERROUS SULFATE 300 (60 FE) MG/5ML PO SYRP
300.0000 mg | ORAL_SOLUTION | Freq: Every day | ORAL | Status: DC
  Administered 2019-07-24 – 2019-08-01 (×9): 300 mg
  Filled 2019-07-24 (×9): qty 5

## 2019-07-24 MED ORDER — HYDROCORTISONE NA SUCCINATE PF 100 MG IJ SOLR
50.0000 mg | Freq: Four times a day (QID) | INTRAMUSCULAR | Status: DC
  Administered 2019-07-24 – 2019-07-29 (×20): 50 mg via INTRAVENOUS
  Filled 2019-07-24 (×20): qty 2

## 2019-07-24 MED ORDER — VANCOMYCIN HCL 1250 MG/250ML IV SOLN
1250.0000 mg | Freq: Two times a day (BID) | INTRAVENOUS | Status: DC
  Administered 2019-07-24 – 2019-07-27 (×7): 1250 mg via INTRAVENOUS
  Filled 2019-07-24 (×7): qty 250

## 2019-07-24 MED ORDER — MIDODRINE HCL 5 MG PO TABS
20.0000 mg | ORAL_TABLET | Freq: Three times a day (TID) | ORAL | Status: DC
  Administered 2019-07-24 – 2019-08-01 (×24): 20 mg
  Filled 2019-07-24 (×25): qty 4

## 2019-07-24 MED ORDER — LACTATED RINGERS IV SOLN
INTRAVENOUS | Status: DC
  Administered 2019-07-26: 900 mL via INTRAVENOUS

## 2019-07-24 MED ORDER — PIVOT 1.5 CAL PO LIQD
1000.0000 mL | ORAL | Status: DC
  Administered 2019-07-24 – 2019-07-26 (×3): 1000 mL
  Filled 2019-07-24 (×4): qty 1000

## 2019-07-24 MED ORDER — LACTATED RINGERS IV BOLUS
1000.0000 mL | Freq: Once | INTRAVENOUS | Status: AC
  Administered 2019-07-24: 1000 mL via INTRAVENOUS

## 2019-07-24 MED ORDER — VITAL HIGH PROTEIN PO LIQD: 1000.0000 mL | ORAL | Status: DC

## 2019-07-24 MED ORDER — POTASSIUM CHLORIDE 10 MEQ/50ML IV SOLN
10.0000 meq | INTRAVENOUS | Status: AC
  Administered 2019-07-24 (×4): 10 meq via INTRAVENOUS
  Filled 2019-07-24 (×5): qty 50

## 2019-07-24 MED ORDER — PRO-STAT SUGAR FREE PO LIQD
30.0000 mL | Freq: Four times a day (QID) | ORAL | Status: DC
  Administered 2019-07-24 – 2019-07-26 (×9): 30 mL
  Filled 2019-07-24 (×7): qty 30

## 2019-07-24 MED ORDER — MAGNESIUM SULFATE 2 GM/50ML IV SOLN
2.0000 g | Freq: Once | INTRAVENOUS | Status: AC
  Administered 2019-07-24: 2 g via INTRAVENOUS

## 2019-07-24 MED ORDER — NOREPINEPHRINE 16 MG/250ML-% IV SOLN
0.0000 ug/min | INTRAVENOUS | Status: DC
  Administered 2019-07-24: 32 ug/min via INTRAVENOUS
  Administered 2019-07-25: 2.81 ug/min via INTRAVENOUS
  Filled 2019-07-24 (×4): qty 250

## 2019-07-24 MED ORDER — LACTATED RINGERS IV BOLUS: 1000.0000 mL | Freq: Once | INTRAVENOUS | Status: DC
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
## 2019-07-24 NOTE — Progress Notes (Signed)
1 Day Post-Op   Subjective/Chief Complaint: Remains on pressors Nursing reports dressings re-enforced secondary to oozing   Objective: Vital signs in last 24 hours: Temp:  [97.2 F (36.2 C)-98.3 F (36.8 C)] 97.2 F (36.2 C) (03/10 0354) Pulse Rate:  [58-98] 71 (03/10 0600) Resp:  [15-23] 18 (03/10 0600) BP: (91-121)/(57-73) 116/64 (03/10 0419) SpO2:  [99 %-100 %] 100 % (03/10 0750) Arterial Line BP: (85-135)/(51-77) 104/63 (03/10 0600) FiO2 (%):  [30 %] 30 % (03/10 0750) Last BM Date: 08/11/2019  Intake/Output from previous day: 03/09 0701 - 03/10 0700 In: 2874.7 [I.V.:1926.7; Blood:580; IV Piggyback:368] Out: [id number] [Urine:790; Emesis/NG output:250; Stool:25; Blood:10] Intake/Output this shift: No intake/output data recorded.  Exam: On vent Dressings left in place  Lab Results:  Recent Labs    07/31/2019 0422 07/24/19 0407  WBC 11.2* 10.5  HGB 10.2* 9.0*  HCT 30.9* 27.3*  PLT 27* 22*   BMET Recent Labs    07/24/2019 0422 07/24/19 0407  NA 144 143  K 4.6 3.2*  CL 120* 116*  CO2 18* 18*  GLUCOSE 129* 104*  BUN 19 15  CREATININE <0.30* <0.30*  CALCIUM 7.5* 7.1*   PT/INR Recent Labs    07/24/2019 0008 07/24/2019 0418  LABPROT 21.3* 21.0*  INR 1.9* 1.8*   ABG Recent Labs    07/20/2019 1730 07/28/2019 0723  PHART 7.331* 7.398  HCO3 17.6* 15.6*    Studies/Results: No results found.  Anti-infectives: Anti-infectives (From admission, onward)   Start     Dose/Rate Route Frequency Ordered Stop   08/14/2019 1000  linezolid (ZYVOX) IVPB 600 mg     600 mg 300 mL/hr over 60 Minutes Intravenous Every 12 hours 07/20/2019 0831     08/14/2019 0600  clindamycin (CLEOCIN) IVPB 900 mg  Status:  Discontinued     900 mg 100 mL/hr over 30 Minutes Intravenous On call to O.R. 08/10/2019 1320 07/24/2019 1321   08/14/2019 0600  gentamicin (GARAMYCIN) 360 mg in dextrose 5 % 100 mL IVPB  Status:  Discontinued     5 mg/kg  71.2 kg 109 mL/hr over 60 Minutes Intravenous On call to O.R.  08/10/2019 1320 07/29/2019 1321   08/10/2019 0000  vancomycin (VANCOREADY) IVPB 1250 mg/250 mL  Status:  Discontinued     1,250 mg 166.7 mL/hr over 90 Minutes Intravenous Every 12 hours 07/26/2019 1246 08/06/2019 0831   08/13/2019 1400  vancomycin (VANCOREADY) IVPB 1500 mg/300 mL     1,500 mg 150 mL/hr over 120 Minutes Intravenous  Once 08/07/2019 1230 08/10/2019 1600   08/12/2019 1400  piperacillin-tazobactam (ZOSYN) IVPB 3.375 g     3.375 g 12.5 mL/hr over 240 Minutes Intravenous Every 8 hours 08/02/2019 1321     07/26/2019 1230  vancomycin (VANCOCIN) IVPB 1000 mg/200 mL premix  Status:  Discontinued     1,000 mg 200 mL/hr over 60 Minutes Intravenous  Once 08/11/2019 1225 07/16/2019 1230   07/19/19 0400  vancomycin (VANCOREADY) IVPB 1250 mg/250 mL  Status:  Discontinued     1,250 mg 166.7 mL/hr over 90 Minutes Intravenous Every 12 hours 07/18/19 1457 07/19/19 1011   07/18/19 1600  vancomycin (VANCOREADY) IVPB 1500 mg/300 mL     1,500 mg 150 mL/hr over 120 Minutes Intravenous  Once 07/18/19 1457 07/18/19 1745   07/18/19 0200  ceFEPIme (MAXIPIME) 2 g in sodium chloride 0.9 % 100 mL IVPB  Status:  Discontinued     2 g 200 mL/hr over 30 Minutes Intravenous Every 8 hours 07/18/19 0054 07/29/2019 1321  08/06/2019 1715  ceFEPIme (MAXIPIME) 2 g in sodium chloride 0.9 % 100 mL IVPB     2 g 200 mL/hr over 30 Minutes Intravenous  Once 08/05/2019 1707 07/15/2019 1818   07/28/2019 1715  metroNIDAZOLE (FLAGYL) IVPB 500 mg     500 mg 100 mL/hr over 60 Minutes Intravenous  Once 07/30/2019 1707 07/26/2019 1900      Assessment/Plan: s/p Procedure(s): EXCISIONAL DEBRIDEMENT OF BACK AND FLANK WOUND (N/A)  Given finding in the OR yesterday, without obvious further nec fasc, do not plan on going back.  Dressing changes can now be done at the bedside.  Given low platelets, would leave current dressings in place today  and transfuse platelets as needed.   LOS: 7 days    Coralie Keens 07/24/2019

## 2019-07-24 NOTE — Progress Notes (Signed)
[place] Progress Note Patient Name: [name] [name] DOB: [birth year] MRN: [id number]   Date of Service  07/24/2019  HPI/Events of Note  Platelet count down to 22 K from 27 K, no overt bleeding.  eICU Interventions  No intervention at this time, continue to monitor platelet count closely. Consider platelet transfusion for count < 10 or active bleeding.        Kerry Kass Alexy Bringle 07/24/2019, 5:19 AM

## 2019-07-24 NOTE — Progress Notes (Signed)
[name] for Infectious Disease   Reason for visit: Follow up on wet gangrene, nec fasciitis  Interval History: remains intubated and sedated.  WBC 10.5 and remains afebrile.  Platelets continue to decrease.   Total antibiotics days 8   Physical Exam: Constitutional:  Vitals:   07/24/19 1345 07/24/19 1400  BP:    Pulse: 69 76  Resp: 18 18  Temp:    SpO2: 100% 100%   patient is arousable but falls back to sleep Eyes: anicteric HENT: +ET Respiratory: respiratory effort on vent Cardiovascular: RRR   Review of Systems: Unable to be assessed due to patient factors  Lab Results  Component Value Date   WBC 10.5 07/24/2019   HGB 9.0 (L) 07/24/2019   HCT 27.3 (L) 07/24/2019   MCV 80.3 07/24/2019   PLT 22 (LL) 07/24/2019    Lab Results  Component Value Date   CREATININE <0.30 (L) 07/24/2019   BUN 15 07/24/2019   NA 143 07/24/2019   K 3.2 (L) 07/24/2019   CL 116 (H) 07/24/2019   CO2 18 (L) 07/24/2019    Lab Results  Component Value Date   ALT 11 07/18/2019   AST 14 (L) 08/07/2019   ALKPHOS 81 08/02/2019     Microbiology: Recent Results (from the past 240 hour(s))  SARS CORONAVIRUS 2 (TAT 6-24 HRS) Nasopharyngeal Nasopharyngeal Swab     Status: None   Collection Time: 07/23/2019  7:00 PM   Specimen: Nasopharyngeal Swab  Result Value Ref Range Status   SARS Coronavirus 2 NEGATIVE NEGATIVE Final    Comment: (NOTE) SARS-CoV-2 target nucleic acids are NOT DETECTED. The SARS-CoV-2 RNA is generally detectable in upper and lower respiratory specimens during the acute phase of infection. Negative results do not preclude SARS-CoV-2 infection, do not rule out co-infections with other pathogens, and should not be used as the sole basis for treatment or other patient management decisions. Negative results must be combined with clinical observations, patient history, and epidemiological information. The expected result is Negative. Fact Sheet for  Patients: SugarRoll.be Fact Sheet for Healthcare Providers: https://www.woods-mathews.com/ This test is not yet approved or cleared by the [place] FDA and  has been authorized for detection and/or diagnosis of SARS-CoV-2 by FDA under an Emergency Use Authorization (EUA). This EUA will remain  in effect (meaning this test can be used) for the duration of the COVID-19 declaration under Section 56 4(b)(1) of the Act, 21 U.S.C. section 360bbb-3(b)(1), unless the authorization is terminated or revoked sooner. Performed at [facility], [place] [address], [place], [place] [address]   Blood Culture (routine x 2)     Status: None   Collection Time: 07/19/2019  7:26 PM   Specimen: BLOOD LEFT HAND  Result Value Ref Range Status   Specimen Description   Final    BLOOD LEFT HAND BOTTLES DRAWN AEROBIC ONLY Blood Culture results may not be optimal due to an inadequate volume of blood received in culture bottles Performed at [facility], [place] [address] [place], [place] [address]    Special Requests   Final    NONE Performed at [facility], [place] [address], [place], [place] [address]    Culture   Final    NO GROWTH 5 DAYS Performed at [facility], [place] [address], [place], [place] [address]    Report Status 08/13/2019 FINAL  Final  Urine Culture     Status: Abnormal   Collection Time: 07/24/2019  7:58 PM   Specimen: Urine, Clean Catch  Result Value Ref Range Status   Specimen Description   Final    URINE, CLEAN CATCH Performed at [facility], [place] [address] [place], [place] [address]    Special Requests   Final    NONE Performed at [facility], [place] [address], [place], [place] [address]    Culture MULTIPLE SPECIES PRESENT, SUGGEST RECOLLECTION (A)  Final   Report Status 07/19/2019 FINAL  Final  MRSA PCR Screening     Status: Abnormal   Collection Time:  07/18/19  7:59 AM   Specimen: Nasal Mucosa; Nasopharyngeal  Result Value Ref Range Status   MRSA by PCR POSITIVE (A) NEGATIVE Final    Comment:        The GeneXpert MRSA Assay (FDA approved for NASAL specimens only), is one component of a comprehensive MRSA colonization surveillance program. It is not intended to diagnose MRSA infection nor to guide or monitor treatment for MRSA infections. RESULT CALLED TO, READ BACK BY AND VERIFIED WITH: ROMINS, H. @ [id number] 07/18/2019 PERRY, J. Performed at [facility], [place] [address] [place], [place] [address]   Aerobic/Anaerobic Culture (surgical/deep wound)     Status: Abnormal   Collection Time: 07/28/2019  4:01 PM   Specimen: PATH Cytology Misc. fluid; Body Fluid  Result Value Ref Range Status   Specimen Description   Final    ABSCESS Performed at [place] [address] [place], [place] [address]    Special Requests FLANK  Final   Gram Stain   Final    RARE WBC PRESENT, PREDOMINANTLY PMN ABUNDANT GRAM NEGATIVE RODS MODERATE GRAM POSITIVE COCCI IN PAIRS IN CLUSTERS MODERATE GRAM POSITIVE RODS Performed at [facility], [place] [address] [place], [place] [address]    Culture (A)  Final    MULTIPLE ORGANISMS PRESENT, NONE PREDOMINANT NO GROUP A STREP (S.PYOGENES) ISOLATED NO STAPHYLOCOCCUS AUREUS ISOLATED MIXED ANAEROBIC FLORA PRESENT.  CALL LAB IF FURTHER IID REQUIRED.    Report Status 08/04/2019 FINAL  Final    Impression/Plan:  1. Nec fasciitis - continue with broad coverage and now back on vancomycin due to platelet issue.    2.  Thrombocytopenia - likely from his acute illness but I also have stopped his linezolid in case that contributes some. Back on vancomycin as above.   3. Sepsis - remains on pressor support.  Overall poor prognosis and I agree with palliative efforts.

## 2019-07-24 NOTE — Progress Notes (Addendum)
Nutrition Follow-up  DOCUMENTATION CODES:   Not applicable  INTERVENTION:  - will order Pivot 1.5 @ 20 ml/hr and advance by 10 ml every 24 hours to reach goal rate of 60 ml/hr with 30 ml prostat QID. - at goal rate, this regimen will provide 2560 kcal, 195 grams protein, and 1080 ml free water. - recommend 500 mg ascorbic acid BID and 220 mg zinc sulfate/day.    NUTRITION DIAGNOSIS:   Increased nutrient needs related to acute illness, wound healing, post-op healing as evidenced by estimated needs. -ongoing  GOAL:   Patient will meet greater than or equal to 90% of their needs -unmet at this time  MONITOR:   Vent status, TF tolerance, Labs, Weight trends, Skin  REASON FOR ASSESSMENT:   Ventilator, Consult Enteral/tube feeding initiation and management  ASSESSMENT:   54 y.o. male with medical history significant of quadriplegia, suprapubic catheter, stage 4 sacral decubitus, obesity, chronic hypotension on midodrine, recurrent UTIs. Patient went to Urologist's office on 3/3 and had suprapubic catheter exchanged. After this he developed tachycardia and hypotension and was sent to the ED.  Significant Events: 3/3- admission 3/4- initial RD assessment 3/7- OR for excision and extensive debridement of lower back, R chest wall, R flank, R anterior abdominal wall; excision of L lower back  3/7- NGT placed in L nare 3/9- return to OR   Patient remains intubated with NGT in place, clamped. RN reports no abdominal distention, abdomen soft. TF c/s received. Previous concern for ileus vs gastroparesis so will start at trickle rate and advance slowly to aid in tolerance. Patient has not been weighed since admission (3/3). Flow sheet documentation indicates mild edema to LUE and moderate pitting edema to BLE.   Per notes: -septic shock  - thrombocytopenia - acute on chronic anemia - patient remains Full Code    Patient is currently intubated on ventilator support MV: 4.7  L/min Temp (24hrs), Avg:98 F (36.7 C), Min:97.2 F (36.2 C), Max:98.8 F (37.1 C) Propofol: none BP: 80/54 and MAP: 58    Labs reviewed; CBGs: 106 and 98 mg/dl, K: 3.2 mmol/l, Cl: 116 mmol/l, creatinine: <0.3 mg/dl, Ca: 7.1 mg/dl, Mg: 1.4 mg/dl. Medications reviewed; 300 mg ferrous sulfate per tube/day, 50 mg solu-cortef QID, sliding scale novolog, 2 g IV Mg sulfate x1 run 3/10, 10 mEq IV KCl x4 runs 3/10, 30 mmol IV NaPhos x1 run 3/9. IVF; LR @ 100 ml/hr.  Drips; vaso @ 0.04 units/min, levo @ 30 mcg/min   Diet Order:   Diet Order            Diet NPO time specified  Diet effective midnight              EDUCATION NEEDS:   No education needs have been identified at this time  Skin:  Skin Assessment: Skin Integrity Issues: Other: Left trochanter: 5cm x 4cm (100% slough moist); Right flank: 3cm x 3cm Unstageable (100% nonviable); Right lateral chest: 9cm x 7cm x 0.1cm; Right thoracic area: 4cm x 3cm x 0.1cm (100% necrotic); Right thoracic area distal: 5cm x 2cm x 0.2cm (50% nonviable/50% pale non-granual); Right abdominal area: 3cmx 4cm x 3cm (80% pale, non granular/20% yellow/green); Sacrum/buttocks/ITs: 26cm x 30cm x 0.4cm (75% red, 25% nonviable; area will not heal at this point); Scattered partial thickness ulcers over the right hip: pale with fibrinous material, dry; Left medial heel: 3cm x 3cm x 0cm (100% hard stable eschar)  Last BM:  3/10  Height:   Ht Readings  from Last 1 Encounters:  07/18/19 6' (1.829 m)    Weight:   Wt Readings from Last 1 Encounters:  07/31/2019 71.2 kg    Ideal Body Weight:  80.9 kg  BMI:  Body mass index is 21.29 kg/m.  Estimated Nutritional Needs:   Kcal:  2400-2600 kcal  Protein:  164-199 grams (2.3-2.8 grams/kg)  Fluid:  >/= 2.5 L/day     [name], MS, RD, LDN, CNSC Inpatient Clinical Dietitian RD pager # available in AMION  After hours/weekend pager # available in [facility]

## 2019-07-24 NOTE — Progress Notes (Signed)
Notified E-link twice of PT AM lab values. No interventions noted for platelet count of 22. Magnesium and potassium are also a little low, no plan to replenish or re-draw labs as of now. Will inform day shift oncoming nurse of this so she is able to follow up with provider.

## 2019-07-24 NOTE — Progress Notes (Signed)
He continues to have a decrease in platelets, likely from the infection.  However, linezolid may be contributing.  I will stop the linezolid at this time as he is past the toxin production phase.  His culture did not grow staph or strep and seems to be mixed anaerobes but I will have him continue with vancomycin for now. Thayer Headings, MD

## 2019-07-24 NOTE — Progress Notes (Signed)
Pharmacy Antibiotic Note  [name] [name] is a 54 y.o. male admitted on 08/09/2019 with sepsis.  Pharmacy was consulted for vancomycin dosing addition to cefepime started 3/3  D8 full abx,  CCS changed Cefepime to Zosyn on 3/7 Vanc >> Linezolid >> Vanc, per ID. 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 Findings are highly concerning for developing necrotizing fasciitis in these regions.  Height: 6' (182.9 cm) Weight: 157 lb (71.2 kg) IBW/kg (Calculated) : 77.6  Temp (24hrs), Avg:97.8 F (36.6 C), Min:97.2 F (36.2 C), Max:98.3 F (36.8 C)  Recent Labs  Lab 08/06/2019 1907 08/12/2019 1926 07/18/19 0939 07/28/2019 0247 07/16/2019 0400 07/31/2019 1602 07/24/2019 1906 08/02/2019 0418 08/06/2019 0422 07/24/19 0407  WBC  --  16.3*   < >  --  14.3*  --  15.8* 21.8* 11.2* 10.5  CREATININE  --  <0.30*   < >   < >  --  0.30* <0.30* <0.30* <0.30* <0.30*  LATICACIDVEN 1.6 1.7  --   --   --   --   --   --   --   --    < > = values in this interval not displayed.    CrCl cannot be calculated (This lab value cannot be used to calculate CrCl because it is not a number: <0.30).    Allergies  Allergen Reactions  . Ferumoxytol Anaphylaxis and Other (See Comments)    (Feraheme) = "SYNCOPE; patient tolerated Venofer 10/01/18 s rxn"   . Oxybutynin Other (See Comments)    Hallucinations    . Vancomycin Other (See Comments)    ARF 05-2016 -- affects kidneys    Antimicrobials this  admission:  3/2 flagyl x 1 3/3 Cefepime >>3/7 3/7 Zosyn >> 3/4 Vanc >> 3/5,  3/7 >>3/8, 3/10 >> 3/8 Linezolid >> 3/10  Microbiology results:  3/3 BCx: ng-final 3/3 UCx: mult sp-Final 3/4 MRSA PCR: positive 3/7 Flank abscess: mult sp, no Strep A, no Staph aureus  Thank you for allowing pharmacy to be a part of this patient's care.  Minda Ditto PharmD 07/24/2019, 9:13 AM

## 2019-07-25 ENCOUNTER — Inpatient Hospital Stay (HOSPITAL_COMMUNITY): Payer: Medicare Other

## 2019-07-25 LAB — CBC
HCT: 26.3 % — ABNORMAL LOW (ref 39.0–52.0)
Hemoglobin: 8.7 g/dL — ABNORMAL LOW (ref 13.0–17.0)
MCH: 26.9 pg (ref 26.0–34.0)
MCHC: 33.1 g/dL (ref 30.0–36.0)
MCV: 81.2 fL (ref 80.0–100.0)
Platelets: 37 10*3/uL — ABNORMAL LOW (ref 150–400)
RBC: 3.24 MIL/uL — ABNORMAL LOW (ref 4.22–5.81)
RDW: 18.9 % — ABNORMAL HIGH (ref 11.5–15.5)
WBC: 9.7 10*3/uL (ref 4.0–10.5)
nRBC: 0.2 % (ref 0.0–0.2)

## 2019-07-25 LAB — BASIC METABOLIC PANEL
Anion gap: 9 (ref 5–15)
BUN: 19 mg/dL (ref 6–20)
CO2: 19 mmol/L — ABNORMAL LOW (ref 22–32)
Calcium: 7.1 mg/dL — ABNORMAL LOW (ref 8.9–10.3)
Chloride: 115 mmol/L — ABNORMAL HIGH (ref 98–111)
Creatinine, Ser: <0.3 mg/dL — ABNORMAL LOW (ref 0.61–1.24)
Glucose, Bld: 144 mg/dL — ABNORMAL HIGH (ref 70–99)
Potassium: 3.5 mmol/L (ref 3.5–5.1)
Sodium: 143 mmol/L (ref 135–145)

## 2019-07-25 LAB — MAGNESIUM: Magnesium: 1.5 mg/dL — ABNORMAL LOW (ref 1.7–2.4)

## 2019-07-25 LAB — PHOSPHORUS: Phosphorus: 2.7 mg/dL (ref 2.5–4.6)

## 2019-07-25 MED ORDER — MAGNESIUM SULFATE 2 GM/50ML IV SOLN
2.0000 g | Freq: Once | INTRAVENOUS | Status: AC
  Administered 2019-07-25: 2 g via INTRAVENOUS
  Filled 2019-07-25: qty 50

## 2019-07-25 NOTE — Progress Notes (Signed)
Patient's plts up to 37K today from 22K yesterday.  Will plan to start dressing changes today.  D/W Hildred Alamin, RN.  She will page [place] this afternoon when she does the patient's dressing change so we can see wounds.  Pressor needs seem to be improving as well.  Will see later today.  Henreitta Cea 7:39 AM 07/25/2019

## 2019-07-25 NOTE — Progress Notes (Signed)
2 Days Post-Op  Subjective: On the ventilator but is awake and alert and answers questions appropriately with head nodding.  ROS: Unable as he is on the ventilator  Objective: Vital signs in last 24 hours: Temp:  [97.4 F (36.3 C)-99 F (37.2 C)] 99 F (37.2 C) (03/11 1200) Pulse Rate:  [60-101] 80 (03/11 1445) Resp:  [13-30] 18 (03/11 1445) BP: (88-138)/(55-78) 94/65 (03/11 0800) SpO2:  [96 %-100 %] 100 % (03/11 1445) Arterial Line BP: (82-154)/(47-79) 94/49 (03/11 1445) FiO2 (%):  [30 %] 30 % (03/11 1547) Weight:  [89 kg] 89 kg (03/11 0500) Last BM Date: 07/25/19  Intake/Output from previous day: 03/10 0701 - 03/11 0700 In: [id number] [I.V.:2387.5; NG/GT:327.7; IV Piggyback:1805.9] Out: 2720 [Urine:2200; Emesis/NG output:20; Stool:500] Intake/Output this shift: Total I/O In: 1228.5 [I.V.:773; NG/GT:140; IV Piggyback:315.5] Out: -   PE: Skin: Extensive wounds noted along his back and buttocks.  In general these are all relatively clean.  There is one area between his flank wound and a back wound with some necrotic skin.  There is no overt infection still present or necrotic tissue that needs significant further debridement.  There is an odor of Pseudomonas within one of the very deep wounds.  Lab Results:  Recent Labs    07/24/19 0407 07/25/19 0447  WBC 10.5 9.7  HGB 9.0* 8.7*  HCT 27.3* 26.3*  PLT 22* 37*   BMET Recent Labs    07/24/19 0407 07/25/19 0447  NA 143 143  K 3.2* 3.5  CL 116* 115*  CO2 18* 19*  GLUCOSE 104* 144*  BUN 15 19  CREATININE <0.30* <0.30*  CALCIUM 7.1* 7.1*   PT/INR No results for input(s): LABPROT, INR in the last 72 hours. CMP     Component Value Date/Time   NA 143 07/25/2019 0447   NA 139 11/10/2016 0000   NA 137 10/07/2014 1148   K 3.5 07/25/2019 0447   K 3.7 10/07/2014 1148   CL 115 (H) 07/25/2019 0447   CO2 19 (L) 07/25/2019 0447   CO2 22 10/07/2014 1148   GLUCOSE 144 (H) 07/25/2019 0447   GLUCOSE 117 10/07/2014 1148     BUN 19 07/25/2019 0447   BUN 10 11/10/2016 0000   BUN 8.7 10/07/2014 1148   CREATININE <0.30 (L) 07/25/2019 0447   CREATININE 0.57 (L) 07/01/2015 1606   CREATININE 0.6 (L) 10/07/2014 1148   CALCIUM 7.1 (L) 07/25/2019 0447   CALCIUM 9.1 10/07/2014 1148   PROT 4.3 (L) 07/29/2019 1926   PROT 8.0 10/07/2014 1148   ALBUMIN 1.1 (L) 07/15/2019 1926   ALBUMIN 2.8 (L) 10/07/2014 1148   AST 14 (L) 08/11/2019 1926   AST 16 10/07/2014 1148   ALT 11 08/04/2019 1926   ALT 7 10/07/2014 1148   ALKPHOS 81 08/14/2019 1926   ALKPHOS 80 10/07/2014 1148   BILITOT 0.7 07/30/2019 1926   BILITOT <0.20 10/07/2014 1148   GFRNONAA NOT CALCULATED 07/25/2019 0447   GFRAA NOT CALCULATED 07/25/2019 0447   Lipase     Component Value Date/Time   LIPASE 12 07/15/2019 1926       Studies/Results: DG Chest Port 1 View  Result Date: 07/25/2019 CLINICAL DATA:  Respiratory failure. EXAM: PORTABLE CHEST 1 VIEW COMPARISON:  Chest x-ray 07/24/2019 FINDINGS: The endotracheal tube, NG tube and bilateral central venous catheters are stable. Persistent interstitial and airspace process in the lungs with bilateral pleural effusions. IMPRESSION: 1. Stable support apparatus. 2. Persistent interstitial and airspace process and pleural effusions. Electronically  Signed   By: Marijo Sanes M.D.   On: 07/25/2019 06:44   DG Chest Port 1 View  Result Date: 07/24/2019 CLINICAL DATA:  Respiratory failure. EXAM: PORTABLE CHEST 1 VIEW COMPARISON:  Chest x-ray 08/12/2019 FINDINGS: The endotracheal tube, NG tube, right IJ and left IJ catheters are stable. The left IJ catheter tip is in the left brachiocephalic vein. Persistent diffuse interstitial and airspace process in the lungs but slight overall improved aeration particularly in the upper lobe regions. Stable large lytic upper right rib lesion. IMPRESSION: 1. Stable support apparatus. 2. Persistent diffuse interstitial and airspace process in the lungs but slight improved aeration  particularly in the upper lobe regions. Electronically Signed   By: Marijo Sanes M.D.   On: 07/24/2019 08:03    Anti-infectives: Anti-infectives (From admission, onward)   Start     Dose/Rate Route Frequency Ordered Stop   07/24/19 1000  vancomycin (VANCOREADY) IVPB 1250 mg/250 mL     1,250 mg 166.7 mL/hr over 90 Minutes Intravenous Every 12 hours 07/24/19 0919     07/26/2019 1000  linezolid (ZYVOX) IVPB 600 mg  Status:  Discontinued     600 mg 300 mL/hr over 60 Minutes Intravenous Every 12 hours 08/08/2019 0831 07/24/19 0845   07/25/2019 0600  clindamycin (CLEOCIN) IVPB 900 mg  Status:  Discontinued     900 mg 100 mL/hr over 30 Minutes Intravenous On call to O.R. 08/02/2019 1320 08/07/2019 1321   08/11/2019 0600  gentamicin (GARAMYCIN) 360 mg in dextrose 5 % 100 mL IVPB  Status:  Discontinued     5 mg/kg  71.2 kg 109 mL/hr over 60 Minutes Intravenous On call to O.R. 07/20/2019 1320 08/13/2019 1321   07/21/2019 0000  vancomycin (VANCOREADY) IVPB 1250 mg/250 mL  Status:  Discontinued     1,250 mg 166.7 mL/hr over 90 Minutes Intravenous Every 12 hours 07/16/2019 1246 07/21/2019 0831   07/31/2019 1400  vancomycin (VANCOREADY) IVPB 1500 mg/300 mL     1,500 mg 150 mL/hr over 120 Minutes Intravenous  Once 08/11/2019 1230 08/02/2019 1600   07/19/2019 1400  piperacillin-tazobactam (ZOSYN) IVPB 3.375 g     3.375 g 12.5 mL/hr over 240 Minutes Intravenous Every 8 hours 08/08/2019 1321     08/12/2019 1230  vancomycin (VANCOCIN) IVPB 1000 mg/200 mL premix  Status:  Discontinued     1,000 mg 200 mL/hr over 60 Minutes Intravenous  Once 08/06/2019 1225 08/10/2019 1230   07/19/19 0400  vancomycin (VANCOREADY) IVPB 1250 mg/250 mL  Status:  Discontinued     1,250 mg 166.7 mL/hr over 90 Minutes Intravenous Every 12 hours 07/18/19 1457 07/19/19 1011   07/18/19 1600  vancomycin (VANCOREADY) IVPB 1500 mg/300 mL     1,500 mg 150 mL/hr over 120 Minutes Intravenous  Once 07/18/19 1457 07/18/19 1745   07/18/19 0200  ceFEPIme (MAXIPIME) 2 g in  sodium chloride 0.9 % 100 mL IVPB  Status:  Discontinued     2 g 200 mL/hr over 30 Minutes Intravenous Every 8 hours 07/18/19 0054 08/04/2019 1321   08/06/2019 1715  ceFEPIme (MAXIPIME) 2 g in sodium chloride 0.9 % 100 mL IVPB     2 g 200 mL/hr over 30 Minutes Intravenous  Once 07/20/2019 1707 07/16/2019 1818   07/22/2019 1715  metroNIDAZOLE (FLAGYL) IVPB 500 mg     500 mg 100 mL/hr over 60 Minutes Intravenous  Once 07/29/2019 1707 08/11/2019 1900       Assessment/Plan POD 4, 2 of excisional debridement of back and  flank wounds for 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  There is a small area of skin that appears to be necrosing but will let this further delineate itself.  If it persists it can likely be debrided at the bedside if necessary as opposed to having to go back to the operating room. -Continue with daily dressing changes. -Patient seemed to indicate when I was present to palliative care that he may want to consider transitioning to comfort care.  We will make adjustments to wound care as needed based off of further decision making between the patient and his sister. -We will continue to follow  FEN - TFs VTE - none due to thrombocytopenia ID - [email or web address]/zosyn   LOS: 8 days    Henreitta Cea , [facility] Surgery 07/25/2019, 4:36 PM Please see Amion for pager number during day hours 7:00am-4:30pm or 7:00am -11:30am on weekends
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
## 2019-07-25 NOTE — Progress Notes (Signed)
Due to continued low platelet counts, surgeon recommended per his progress note to not continuously change dressings when saturated. I spoke the charge nurse Wynetta Emery about this and he instructed me to leave gauze packing in place, and to place clean, dry abd. pads over the wound bed. This was completed during AM bath, dressings that were not saturated were left in place. Will speak to day shift nurse about findings.

## 2019-07-25 NOTE — Progress Notes (Signed)
[facility] for Infectious Disease   Reason for visit: Follow up on wet gangrene, nec fasciitis  Interval History: remains intubated, weaning some.  Platelets improved and WBC 9.7.  Less pressor support.    Total antibiotics days 9   Physical Exam: Constitutional:  Vitals:   07/25/19 1430 07/25/19 1445  BP:    Pulse: 88 80  Resp: 20 18  Temp:    SpO2: 100% 100%   patient is alert Eyes: anicteric HENT: +ET Respiratory: respiratory effort on vent Cardiovascular: RRR   Review of Systems: Unable to be assessed due to patient factors  Lab Results  Component Value Date   WBC 9.7 07/25/2019   HGB 8.7 (L) 07/25/2019   HCT 26.3 (L) 07/25/2019   MCV 81.2 07/25/2019   PLT 37 (L) 07/25/2019    Lab Results  Component Value Date   CREATININE <0.30 (L) 07/25/2019   BUN 19 07/25/2019   NA 143 07/25/2019   K 3.5 07/25/2019   CL 115 (H) 07/25/2019   CO2 19 (L) 07/25/2019    Lab Results  Component Value Date   ALT 11 07/18/2019   AST 14 (L) 08/13/2019   ALKPHOS 81 08/13/2019     Microbiology: Recent Results (from the past 240 hour(s))  SARS CORONAVIRUS 2 (TAT 6-24 HRS) Nasopharyngeal Nasopharyngeal Swab     Status: None   Collection Time: 08/06/2019  7:00 PM   Specimen: Nasopharyngeal Swab  Result Value Ref Range Status   SARS Coronavirus 2 NEGATIVE NEGATIVE Final    Comment: (NOTE) SARS-CoV-2 target nucleic acids are NOT DETECTED. The SARS-CoV-2 RNA is generally detectable in upper and lower respiratory specimens during the acute phase of infection. Negative results do not preclude SARS-CoV-2 infection, do not rule out co-infections with other pathogens, and should not be used as the sole basis for treatment or other patient management decisions. Negative results must be combined with clinical observations, patient history, and epidemiological information. The expected result is Negative. Fact Sheet for Patients: SugarRoll.be  Fact Sheet for Healthcare Providers: https://www.woods-mathews.com/ This test is not yet approved or cleared by the [place] FDA and  has been authorized for detection and/or diagnosis of SARS-CoV-2 by FDA under an Emergency Use Authorization (EUA). This EUA will remain  in effect (meaning this test can be used) for the duration of the COVID-19 declaration under Section 56 4(b)(1) of the Act, 21 U.S.C. section 360bbb-3(b)(1), unless the authorization is terminated or revoked sooner. Performed at [facility], [place] [address], [place], [place] [address]   Blood Culture (routine x 2)     Status: None   Collection Time: 08/03/2019  7:26 PM   Specimen: BLOOD LEFT HAND  Result Value Ref Range Status   Specimen Description   Final    BLOOD LEFT HAND BOTTLES DRAWN AEROBIC ONLY Blood Culture results may not be optimal due to an inadequate volume of blood received in culture bottles Performed at [facility], [place] [address] [place], [place] [address]    Special Requests   Final    NONE Performed at [facility], [place] [address] [place], [place] [address]    Culture   Final    NO GROWTH 5 DAYS Performed at [facility], [place] [address] [place], [place] [address]    Report Status 07/24/2019 FINAL  Final  Urine Culture     Status: Abnormal   Collection Time: 08/10/2019  7:58 PM   Specimen: Urine,  Clean Catch  Result Value Ref Range Status   Specimen Description   Final    URINE, CLEAN CATCH Performed at [facility], [place] [address] [place], [place] [address]    Special Requests   Final    NONE Performed at [facility], [place] [name] [place], [place] [address]    Culture MULTIPLE SPECIES PRESENT, SUGGEST RECOLLECTION (A)  Final   Report Status 07/19/2019 FINAL  Final  MRSA PCR Screening     Status: Abnormal   Collection Time: 07/18/19  7:59 AM   Specimen: Nasal Mucosa;  Nasopharyngeal  Result Value Ref Range Status   MRSA by PCR POSITIVE (A) NEGATIVE Final    Comment:        The GeneXpert MRSA Assay (FDA approved for NASAL specimens only), is one component of a comprehensive MRSA colonization surveillance program. It is not intended to diagnose MRSA infection nor to guide or monitor treatment for MRSA infections. RESULT CALLED TO, READ BACK BY AND VERIFIED WITH: ROMINS, H. @ [id number] 07/18/2019 PERRY, J. Performed at [facility], [place] [address], [place], [place] [address]   Aerobic/Anaerobic Culture (surgical/deep wound)     Status: Abnormal   Collection Time: 08/13/2019  4:01 PM   Specimen: PATH Cytology Misc. fluid; Body Fluid  Result Value Ref Range Status   Specimen Description   Final    ABSCESS Performed at [place] [address], [place], [place] [address]    Special Requests FLANK  Final   Gram Stain   Final    RARE WBC PRESENT, PREDOMINANTLY PMN ABUNDANT GRAM NEGATIVE RODS MODERATE GRAM POSITIVE COCCI IN PAIRS IN CLUSTERS MODERATE GRAM POSITIVE RODS Performed at [facility], [place] [address], [place], [place] [address]    Culture (A)  Final    MULTIPLE ORGANISMS PRESENT, NONE PREDOMINANT NO GROUP A STREP (S.PYOGENES) ISOLATED NO STAPHYLOCOCCUS AUREUS ISOLATED MIXED ANAEROBIC FLORA PRESENT.  CALL LAB IF FURTHER IID REQUIRED.    Report Status 08/08/2019 FINAL  Final    Impression/Plan:  1. Nec fasciitis - no new growth from the cultures and unclear significance.  He will continue with IV therapy for now.    2.  Thrombocytopenia - improved. Likely from acute illness and improving.  Will continue to monitor.   3. Sepsis - less pressor support and improving.  Will continue to monitor.

## 2019-07-25 NOTE — Plan of Care (Signed)
Patient has had increased hemodynamic stability in last 24 hours. Tube feeding and other nutrition has been provided via NG tube. Close monitoring and titration of vasopressor medications provided to improve outcomes.

## 2019-07-25 NOTE — Progress Notes (Addendum)
Patient expressed to this RN and the RT L. Craddock that he did not want the breathing tube. He repeatedly mouthed "take it out" to the staff. This RN asked if he still wanted it out if there was a condition to not reintubate if warranted in condition. Patient agreed and repeated that he wanted the tube out. This RN explained to the patient that at this time his sister is the decision maker and that the conversation would be continued when she was present and this RN would explain what he said to her at that time and he agreed.   Later in the shift, this patient again indicated to this RN, the charge RN C. Tamala Julian, and the palliative care NP A. Jerline Pain that he wanted the breathing tube removed. The palliative care NP asked if he wanted to be made comfortable and he nodded yes. She asked if he was done fighting and he nodded yes. She then told the patient that she was going to share this with his sister [name] and he agreed. When his sister entered the room, the NP again repeated those questions and he shook his head no. She clarified and he nodded yes to keeping the breathing tube in place. His sister expressed that she only wants to do what the patient wants and that he is leading these discussions.   Will continue to monitor and advocate for what the patient makes known to this RN.
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
## 2019-07-26 LAB — CBC
HCT: 26 % — ABNORMAL LOW (ref 39.0–52.0)
Hemoglobin: 8.2 g/dL — ABNORMAL LOW (ref 13.0–17.0)
MCH: 26.2 pg (ref 26.0–34.0)
MCHC: 31.5 g/dL (ref 30.0–36.0)
MCV: 83.1 fL (ref 80.0–100.0)
Platelets: 39 10*3/uL — ABNORMAL LOW (ref 150–400)
RBC: 3.13 MIL/uL — ABNORMAL LOW (ref 4.22–5.81)
RDW: 18.9 % — ABNORMAL HIGH (ref 11.5–15.5)
WBC: 7.4 10*3/uL (ref 4.0–10.5)
nRBC: 0 % (ref 0.0–0.2)

## 2019-07-26 LAB — GLUCOSE, CAPILLARY
Glucose-Capillary: 137 mg/dL — ABNORMAL HIGH (ref 70–99)
Glucose-Capillary: 138 mg/dL — ABNORMAL HIGH (ref 70–99)
Glucose-Capillary: 139 mg/dL — ABNORMAL HIGH (ref 70–99)
Glucose-Capillary: 150 mg/dL — ABNORMAL HIGH (ref 70–99)
Glucose-Capillary: 168 mg/dL — ABNORMAL HIGH (ref 70–99)

## 2019-07-26 LAB — CREATININE, SERUM
Creatinine, Ser: 0.33 mg/dL — ABNORMAL LOW (ref 0.61–1.24)
GFR calc Af Amer: >60 mL/min (ref >60)
GFR calc non Af Amer: >60 mL/min (ref >60)

## 2019-07-26 MED ORDER — PIVOT 1.5 CAL PO LIQD
1000.0000 mL | ORAL | Status: DC
  Administered 2019-07-26 – 2019-07-29 (×5): 1000 mL
  Filled 2019-07-26 (×4): qty 1000

## 2019-07-26 MED ORDER — ADULT MULTIVITAMIN LIQUID CH
15.0000 mL | Freq: Every day | ORAL | Status: DC
  Administered 2019-07-26 – 2019-08-01 (×7): 15 mL
  Filled 2019-07-26 (×7): qty 15

## 2019-07-26 MED ORDER — FUROSEMIDE 10 MG/ML IJ SOLN
20.0000 mg | Freq: Once | INTRAMUSCULAR | Status: AC
  Administered 2019-07-26: 09:00:00 20 mg via INTRAVENOUS
  Filled 2019-07-26: qty 2

## 2019-07-26 MED ORDER — PRO-STAT SUGAR FREE PO LIQD
60.0000 mL | Freq: Three times a day (TID) | ORAL | Status: DC
  Administered 2019-07-26 – 2019-07-29 (×10): 60 mL
  Filled 2019-07-26 (×9): qty 60

## 2019-07-26 NOTE — Progress Notes (Signed)
Patient refusing oral care and I have explained to him that he is at extreme risk of worsening health and pneumonia without oral care, especially while on ventilator. Patient accepted one administration of oral care with CHG at beginning of shift. Patient likewise does not like repositioning every two hours. Will attempt to utilize bed controls for easier turning and off-loading.

## 2019-07-26 NOTE — Progress Notes (Signed)
Cont daily dressing changes for this patient to minimize turning and disruption for the patient.  Continue discussions with palliative care regarding aggressive vs comfort care.  Will continue to follow.  [name] [name] 7:30 AM 07/26/2019

## 2019-07-26 NOTE — Progress Notes (Signed)
[facility] for Infectious Disease   Reason for visit: Follow up on wet gangrene, nec fasciitis  Interval History: remains intubated.  Palliative care discussions noted.  WBC wnl.  Afebrile.    Physical Exam: Constitutional:  Vitals:   07/26/19 0900 07/26/19 0930  BP:    Pulse: 97 92  Resp: 17 18  Temp:    SpO2: 100% 98%   patient is alert Eyes: anicteric HENT: +ET Respiratory: respiratory effort on vent Cardiovascular: RRR   Review of Systems: Unable to be assessed due to patient factors  Lab Results  Component Value Date   WBC 7.4 07/26/2019   HGB 8.2 (L) 07/26/2019   HCT 26.0 (L) 07/26/2019   MCV 83.1 07/26/2019   PLT 39 (L) 07/26/2019    Lab Results  Component Value Date   CREATININE 0.33 (L) 07/26/2019   BUN 19 07/25/2019   NA 143 07/25/2019   K 3.5 07/25/2019   CL 115 (H) 07/25/2019   CO2 19 (L) 07/25/2019    Lab Results  Component Value Date   ALT 11 07/28/2019   AST 14 (L) 07/20/2019   ALKPHOS 81 07/24/2019     Microbiology: Recent Results (from the past 240 hour(s))  SARS CORONAVIRUS 2 (TAT 6-24 HRS) Nasopharyngeal Nasopharyngeal Swab     Status: None   Collection Time: 08/12/2019  7:00 PM   Specimen: Nasopharyngeal Swab  Result Value Ref Range Status   SARS Coronavirus 2 NEGATIVE NEGATIVE Final    Comment: (NOTE) SARS-CoV-2 target nucleic acids are NOT DETECTED. The SARS-CoV-2 RNA is generally detectable in upper and lower respiratory specimens during the acute phase of infection. Negative results do not preclude SARS-CoV-2 infection, do not rule out co-infections with other pathogens, and should not be used as the sole basis for treatment or other patient management decisions. Negative results must be combined with clinical observations, patient history, and epidemiological information. The expected result is Negative. Fact Sheet for Patients: SugarRoll.be Fact Sheet for Healthcare  Providers: https://www.woods-mathews.com/ This test is not yet approved or cleared by the [place] FDA and  has been authorized for detection and/or diagnosis of SARS-CoV-2 by FDA under an Emergency Use Authorization (EUA). This EUA will remain  in effect (meaning this test can be used) for the duration of the COVID-19 declaration under Section 56 4(b)(1) of the Act, 21 U.S.C. section 360bbb-3(b)(1), unless the authorization is terminated or revoked sooner. Performed at [facility], [place] [address], [place], [place] [address]   Blood Culture (routine x 2)     Status: None   Collection Time: 07/31/2019  7:26 PM   Specimen: BLOOD LEFT HAND  Result Value Ref Range Status   Specimen Description   Final    BLOOD LEFT HAND BOTTLES DRAWN AEROBIC ONLY Blood Culture results may not be optimal due to an inadequate volume of blood received in culture bottles Performed at [facility], [place] [address], [place], [place] [address]    Special Requests   Final    NONE Performed at [facility], [place] [address], [place], [place] [address]    Culture   Final    NO GROWTH 5 DAYS Performed at [facility], [place] [address], [place],  [address]    Report Status 08/02/2019 FINAL  Final  Urine Culture     Status: Abnormal   Collection Time: 07/31/2019  7:58 PM   Specimen: Urine, Clean Catch  Result Value Ref Range Status  Specimen Description   Final    URINE, CLEAN CATCH Performed at [facility], [place] [address], [employer] [place], [place] [address]    Special Requests   Final    NONE Performed at [facility], [place] [address], [place], [place] [address]    Culture MULTIPLE SPECIES PRESENT, SUGGEST RECOLLECTION (A)  Final   Report Status 07/19/2019 FINAL  Final  MRSA PCR Screening     Status: Abnormal   Collection Time: 07/18/19  7:59 AM   Specimen: Nasal Mucosa; Nasopharyngeal  Result Value Ref Range  Status   MRSA by PCR POSITIVE (A) NEGATIVE Final    Comment:        The GeneXpert MRSA Assay (FDA approved for NASAL specimens only), is one component of a comprehensive MRSA colonization surveillance program. It is not intended to diagnose MRSA infection nor to guide or monitor treatment for MRSA infections. RESULT CALLED TO, READ BACK BY AND VERIFIED WITH: ROMINS, H. @ [id number] 07/18/2019 PERRY, J. Performed at [facility], [place] [address] [place], [place] [address]   Aerobic/Anaerobic Culture (surgical/deep wound)     Status: Abnormal   Collection Time: 07/22/2019  4:01 PM   Specimen: PATH Cytology Misc. fluid; Body Fluid  Result Value Ref Range Status   Specimen Description   Final    ABSCESS Performed at [place] [address], [place], [place] [address]    Special Requests FLANK  Final   Gram Stain   Final    RARE WBC PRESENT, PREDOMINANTLY PMN ABUNDANT GRAM NEGATIVE RODS MODERATE GRAM POSITIVE COCCI IN PAIRS IN CLUSTERS MODERATE GRAM POSITIVE RODS Performed at [facility], [place] [address] [place], [place] [address]    Culture (A)  Final    MULTIPLE ORGANISMS PRESENT, NONE PREDOMINANT NO GROUP A STREP (S.PYOGENES) ISOLATED NO STAPHYLOCOCCUS AUREUS ISOLATED MIXED ANAEROBIC FLORA PRESENT.  CALL LAB IF FURTHER IID REQUIRED.    Report Status 08/08/2019 FINAL  Final    Impression/Plan:  1. Nec Fasciitis - continuing on broad spectrum antibiotics.  No changes.  Continued wound care.    2.  Thrombocytopenia - stable at 39.  Will continue to monitor.   Dr. Megan Salon on over the weekend if needed.

## 2019-07-26 NOTE — Progress Notes (Signed)
After bathing patient he asked when he can get tube out. Advised that physicians will discuss with him later this morning. Reinforced dressings that are due to be changed on day shift.

## 2019-07-26 NOTE — Progress Notes (Addendum)
Nutrition Follow-up  DOCUMENTATION CODES:   Not applicable  INTERVENTION:  - will adjust TF regimen: continue Pivot 1.5 @ 30 ml/hr and advance to new goal rate of 40 ml/hr today at 1400 with 60 ml prostat TID. - this regimen will provide 2040 kcal (105% estimated kcal need), 180 grams protein, and 720 ml free water. - will order 15 ml multivitamin per NGT/day.  - free water flush, if desired, to be per MD/NP.    NUTRITION DIAGNOSIS:   Increased nutrient needs related to acute illness, wound healing, post-op healing as evidenced by estimated needs. -ongoing  GOAL:   Patient will meet greater than or equal to 90% of their needs -to be met with TF regimen   MONITOR:   Vent status, TF tolerance, Labs, Weight trends, Skin  ASSESSMENT:   54 y.o. male with medical history significant of quadriplegia, suprapubic catheter, stage 4 sacral decubitus, obesity, chronic hypotension on midodrine, recurrent UTIs. Patient went to Urologist's office on 3/3 and had suprapubic catheter exchanged. After this he developed tachycardia and hypotension and was sent to the ED.  Significant Events: 3/3- admission 3/4- initial RD assessment 3/7- OR for excision and extensive debridement of lower back, R chest wall, R flank, R anterior abdominal wall; excision of L lower back  3/7- NGT placed in L nare 3/9- return to OR 3/10- TF started at 20 ml/hr   Patient remains intubated with NGT in L nare. He is receiving Pivot 1.5 @ 30 ml/hr with 30 ml prostat QID. This regimen is providing 1480 kcal, 127 grams protein, and 540 ml free water. Goal rate TF regimen: Pivot 1.5 @ 60 ml/hr with 30 ml prostat QID. This regimen provides 2560 kcal, 195 grams protein, and 1080 ml free water.  Adjusted estimated kcal need d/t no further OR trips and potentially getting closer to extubation; ongoing discussions also occurring surrounding [place]. Will adjust TF regimen as outlined above. Estimated need based on admission weight  of 71.2 kg as weight +16.7 kg since that time. Flow sheet documentation indicates mild edema to BUE and deep pitting edema to BLE.   Per notes: - septic shock complicated by baseline vasoplegia--s/p OR x2 for debridement and washout - respiratory failure 2/2 septic shock--tolerating weaning - pleural effusions - ileus vs gastroparesis  - acute on chronic anemia - poor prognosis   Patient is currently intubated on ventilator support MV: 12.8 L/min Temp (24hrs), Avg:98 F (36.7 C), Min:97.2 F (36.2 C), Max:99 F (37.2 C) Propofol: none BP: 120/60 and MAP: 83  Labs reviewed; creatinine: 0.33 mg/dl. Medications reviewed; 300 mg ferrous sulfate/day, 20 mg IV lasix x1 dose 3/12, 50 mg solu-cortef QID, sliding scale novolog, 2 g IV Mg sulfate x1 run 3/11, 40 mg IV protonix/day. IVF; LR @ 50 ml/hr.  Drips; levo @ 3 mcg/min.   Diet Order:   Diet Order            Diet NPO time specified  Diet effective midnight              EDUCATION NEEDS:   No education needs have been identified at this time  Skin:  Skin Assessment: Skin Integrity Issues: Other: Left trochanter: 5cm x 4cm (100% slough moist); Right flank: 3cm x 3cm Unstageable (100% nonviable); Right lateral chest: 9cm x 7cm x 0.1cm; Right thoracic area: 4cm x 3cm x 0.1cm (100% necrotic); Right thoracic area distal: 5cm x 2cm x 0.2cm (50% nonviable/50% pale non-granual); Right abdominal area: 3cmx 4cm x 3cm (  80% pale, non granular/20% yellow/green); Sacrum/buttocks/ITs: 26cm x 30cm x 0.4cm (75% red, 25% nonviable; area will not heal at this point); Scattered partial thickness ulcers over the right hip: pale with fibrinous material, dry; Left medial heel: 3cm x 3cm x 0cm (100% hard stable eschar)  Last BM:  3/12  Height:   Ht Readings from Last 1 Encounters:  07/18/19 6' (1.829 m)    Weight:   Wt Readings from Last 1 Encounters:  07/26/19 87.9 kg    Ideal Body Weight:  80.9 kg  BMI:  Body mass index is 26.28  kg/m.  Estimated Nutritional Needs:   Kcal:  1932 kcal  Protein:  164-199 grams (2.3-2.8 grams/kg)  Fluid:  >/= 2.5 L/day     [name], MS, RD, LDN, CNSC Inpatient Clinical Dietitian RD pager # available in [facility]  After hours/weekend pager # available in [facility]
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
## 2019-07-26 NOTE — Progress Notes (Signed)
Subjective: Sedated, eyes open to verbal stimuli.  Objective: Vital signs in last 24 hours: Temp:  [97.2 F (36.2 C)-99 F (37.2 C)] 97.7 F (36.5 C) (03/12 0800) Pulse Rate:  [43-108] 92 (03/12 0930) Resp:  [13-24] 18 (03/12 0930) BP: (116-136)/(62-68) 136/68 (03/12 0303) SpO2:  [94 %-100 %] 98 % (03/12 0930) Arterial Line BP: (84-180)/(46-89) 115/64 (03/12 0930) FiO2 (%):  [30 %] 30 % (03/12 1150) Weight:  [87.9 kg] 87.9 kg (03/12 0500) Last BM Date: 07/26/19  Intake/Output from previous day: 03/11 0701 - 03/12 0700 In: 3780.7 [I.V.:2352.8; NG/GT:765.2; IV Piggyback:662.8] Out: [id number] [Urine:1350; Stool:325] Intake/Output this shift: Total I/O In: 451 [I.V.:328; IV Piggyback:123] Out: -   General appearance: sedated, ill appearing. Eyes: eyes open to stimuli Resp: ETT in place Ext: edema of lower extremities noted Neuro: Awakens to verbal stimuli  Lab Results:  CBC Latest Ref Rng & Units 07/26/2019 07/25/2019 07/24/2019  WBC 4.0 - 10.5 K/uL 7.4 9.7 10.5  Hemoglobin 13.0 - 17.0 g/dL 8.2(L) 8.7(L) 9.0(L)  Hematocrit 39.0 - 52.0 % 26.0(L) 26.3(L) 27.3(L)  Platelets 150 - 400 K/uL 39(L) 37(L) 22(LL)    BMET Recent Labs    07/24/19 0407 07/24/19 0407 07/25/19 0447 07/26/19 0356  NA 143  --  143  --   K 3.2*  --  3.5  --   CL 116*  --  115*  --   CO2 18*  --  19*  --   GLUCOSE 104*  --  144*  --   BUN 15  --  19  --   CREATININE <0.30*   < > <0.30* 0.33*  CALCIUM 7.1*  --  7.1*  --    < > = values in this interval not displayed.   PT/INR No results for input(s): LABPROT, INR in the last 72 hours. ABG No results for input(s): PHART, HCO3 in the last 72 hours.  Invalid input(s): PCO2, PO2  Studies/Results: DG Chest Port 1 View  Result Date: 07/25/2019 CLINICAL DATA:  Respiratory failure. EXAM: PORTABLE CHEST 1 VIEW COMPARISON:  Chest x-ray 07/24/2019 FINDINGS: The endotracheal tube, NG tube and bilateral central venous catheters are stable. Persistent  interstitial and airspace process in the lungs with bilateral pleural effusions. IMPRESSION: 1. Stable support apparatus. 2. Persistent interstitial and airspace process and pleural effusions. Electronically Signed   By: Marijo Sanes M.D.   On: 07/25/2019 06:44    Anti-infectives: Anti-infectives (From admission, onward)   Start     Dose/Rate Route Frequency Ordered Stop   07/24/19 1000  vancomycin (VANCOREADY) IVPB 1250 mg/250 mL     1,250 mg 166.7 mL/hr over 90 Minutes Intravenous Every 12 hours 07/24/19 0919     07/30/2019 1000  linezolid (ZYVOX) IVPB 600 mg  Status:  Discontinued     600 mg 300 mL/hr over 60 Minutes Intravenous Every 12 hours 07/28/2019 0831 07/24/19 0845   07/21/2019 0600  clindamycin (CLEOCIN) IVPB 900 mg  Status:  Discontinued     900 mg 100 mL/hr over 30 Minutes Intravenous On call to O.R. 08/07/2019 1320 08/05/2019 1321   08/12/2019 0600  gentamicin (GARAMYCIN) 360 mg in dextrose 5 % 100 mL IVPB  Status:  Discontinued     5 mg/kg  71.2 kg 109 mL/hr over 60 Minutes Intravenous On call to O.R. 07/15/2019 1320 07/16/2019 1321   07/22/19 0000  vancomycin (VANCOREADY) IVPB 1250 mg/250 mL  Status:  Discontinued     1,250 mg 166.7 mL/hr over 90 Minutes Intravenous Every 12  hours 07/30/2019 1246 07/21/2019 0831   08/04/2019 1400  vancomycin (VANCOREADY) IVPB 1500 mg/300 mL     1,500 mg 150 mL/hr over 120 Minutes Intravenous  Once 08/06/2019 1230 07/26/2019 1600   08/07/2019 1400  piperacillin-tazobactam (ZOSYN) IVPB 3.375 g     3.375 g 12.5 mL/hr over 240 Minutes Intravenous Every 8 hours 08/11/2019 1321     07/29/2019 1230  vancomycin (VANCOCIN) IVPB 1000 mg/200 mL premix  Status:  Discontinued     1,000 mg 200 mL/hr over 60 Minutes Intravenous  Once 08/08/2019 1225 07/16/2019 1230   07/19/19 0400  vancomycin (VANCOREADY) IVPB 1250 mg/250 mL  Status:  Discontinued     1,250 mg 166.7 mL/hr over 90 Minutes Intravenous Every 12 hours 07/18/19 1457 07/19/19 1011   07/18/19 1600  vancomycin (VANCOREADY)  IVPB 1500 mg/300 mL     1,500 mg 150 mL/hr over 120 Minutes Intravenous  Once 07/18/19 1457 07/18/19 1745   07/18/19 0200  ceFEPIme (MAXIPIME) 2 g in sodium chloride 0.9 % 100 mL IVPB  Status:  Discontinued     2 g 200 mL/hr over 30 Minutes Intravenous Every 8 hours 07/18/19 0054 07/28/2019 1321   08/07/2019 1715  ceFEPIme (MAXIPIME) 2 g in sodium chloride 0.9 % 100 mL IVPB     2 g 200 mL/hr over 30 Minutes Intravenous  Once 07/20/2019 1707 08/11/2019 1818   08/14/2019 1715  metroNIDAZOLE (FLAGYL) IVPB 500 mg     500 mg 100 mL/hr over 60 Minutes Intravenous  Once 08/12/2019 1707 07/16/2019 1900      Assessment/Plan:  Plastic Surgery will continue to follow and be available as needed for assistance.   Palliative care with possible transition to comfort care, currently being discussed with palliative care team.     LOS: 9 days    Charlies Constable, PA-C 07/26/2019

## 2019-07-27 ENCOUNTER — Inpatient Hospital Stay (HOSPITAL_COMMUNITY): Payer: Medicare Other

## 2019-07-27 LAB — BLOOD GAS, ARTERIAL
Acid-base deficit: 2.3 mmol/L — ABNORMAL HIGH (ref 0.0–2.0)
Bicarbonate: 21.9 mmol/L (ref 20.0–28.0)
FIO2: 30
O2 Saturation: 98 %
Patient temperature: 97.9
pCO2 arterial: 36.9 mmHg (ref 32.0–48.0)
pH, Arterial: 7.388 (ref 7.350–7.450)
pO2, Arterial: 121 mmHg — ABNORMAL HIGH (ref 83.0–108.0)

## 2019-07-27 LAB — GLUCOSE, CAPILLARY
Glucose-Capillary: 125 mg/dL — ABNORMAL HIGH (ref 70–99)
Glucose-Capillary: 126 mg/dL — ABNORMAL HIGH (ref 70–99)
Glucose-Capillary: 123 mg/dL — ABNORMAL HIGH (ref 70–99)

## 2019-07-27 LAB — CREATININE, SERUM: Creatinine, Ser: <0.3 mg/dL — ABNORMAL LOW (ref 0.61–1.24)

## 2019-07-27 LAB — VANCOMYCIN, TROUGH: Vancomycin Tr: 37 ug/mL (ref 15–20)

## 2019-07-27 MED ORDER — VANCOMYCIN VARIABLE DOSE PER UNSTABLE RENAL FUNCTION (PHARMACIST DOSING): Status: DC

## 2019-07-27 MED ORDER — FUROSEMIDE 10 MG/ML IJ SOLN
40.0000 mg | Freq: Once | INTRAMUSCULAR | Status: AC
  Administered 2019-07-27: 40 mg via INTRAVENOUS
  Filled 2019-07-27: qty 4

## 2019-07-27 MED ORDER — IPRATROPIUM-ALBUTEROL 0.5-2.5 (3) MG/3ML IN SOLN
3.0000 mL | Freq: Three times a day (TID) | RESPIRATORY_TRACT | Status: DC
  Administered 2019-07-27 – 2019-08-01 (×14): 3 mL via RESPIRATORY_TRACT
  Filled 2019-07-27 (×14): qty 3
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
## 2019-07-27 NOTE — Progress Notes (Addendum)
CRITICAL VALUE ALERT  Critical Value:  Vancomycin trough  Date & Time Notied:  08/11/2019 11:05  Provider Notified: CCM and pharmacy  Orders Received/Actions taken: stop infusing vancomycin.

## 2019-07-27 NOTE — Procedures (Signed)
Extubation Procedure Note  Patient Details:   Name: [name] [name] DOB: [birth year] MRN: [id number]   Airway Documentation:    Vent end date: 08/10/2019 Vent end time: 1315   Evaluation  O2 sats: stable throughout Complications: No apparent complications Patient did tolerate procedure well. Bilateral Breath Sounds: Clear   Yes   Pt extubated per MD order with RN and RT at bedside. Cuff leak heard prior to extubation. No apparent complications noted at this time. Pt is alert and able to speak clearly. Vitals stable.  Esperanza Sheets T 08/03/2019, 1:20 PM

## 2019-07-27 NOTE — Progress Notes (Signed)
At approximately 18:32 while this RN and two other RN's were attempting to turn and complete a wound dressing change on this patient, patient's heart rate dropped to 30 and then maintained 37-48. This RN and other RN's remained in the room with the patient. Blood pressure remained adequate and patient was alert and oriented throughout episode. This RN felt that the patient was not hemodynamically stable enough to roll a second time to complete dressing change. Smaller turns were completed in order to tuck clean bed pads under each side of the patient and reposition the patient. His heart rate remained between 50-60 (as compared to the 70-80s he sustained most of the day prior). Will continue to monitor and pass on to nightshift RN to complete dressing change as able.
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
## 2019-07-27 NOTE — Progress Notes (Signed)
Patient was assessed this afternoon  Has been weaning well on pressure support of 7 with PEEP of 5 Saturations of 100%  Diuresing well with with Lasix  Awake and interactive  ABG reviewed  We will give him a chance of extubation  He still has multiple medical issues ongoing however no surgery is planned in the short-term  We will continue to monitor closely

## 2019-07-27 NOTE — Progress Notes (Signed)
Pharmacy Antibiotic Note  [name] [name] is a 54 y.o. male admitted on 08/11/2019 with sepsis.  Pharmacy was consulted for vancomycin dosing in addition to cefepime started 3/3  D11 full abx,  CCS changed Cefepime to Zosyn on 3/7 Vanc >> Linezolid >> Vanc, per ID. Plt cont to decr, past toxin production phase & no further evidence of nec fasc per 3/9 debridement  Today, 07/31/2019 Vanc trough ordered this am, elevated at 37 mcg/ml, Vanc dose 1250mg  q12  Plan Continue Zosyn 3.375gm q8 - 4 hr infusion Stop current Vanc dose infusing, hold Vancomycin Plan dose Vancomycin based on levels   3/7 CT: Chronic areas of osteomyelitis, bilateral septic arthritis in the hip joints, deep decubitus ulcers overlying the sacrum and right hip joint, and new deep soft tissue ulcers in the right lateral abdominal wall and right flank, with new soft tissue gas extending in the subcutaneous soft tissues of the right flank and paraspinal regions, as detailed above. Findings are highly concerning for developing necrotizing fasciitis in these regions.  Height: 6' (182.9 cm) Weight: 188 lb 15 oz (85.7 kg) IBW/kg (Calculated) : 77.6  Temp (24hrs), Avg:97.7 F (36.5 C), Min:97 F (36.1 C), Max:98.1 F (36.7 C)  Recent Labs  Lab 08/03/2019 0418 07/26/2019 0418 08/07/2019 0422 07/24/19 0407 07/24/19 1053 07/25/19 0447 07/26/19 0356 07/26/19 0403 08/07/2019 0315 07/25/2019 1017  WBC 21.8*  --  11.2* 10.5  --  9.7  --  7.4  --   --   CREATININE <0.30*   < > <0.30* <0.30*  --  <0.30* 0.33*  --  <0.30*  --   LATICACIDVEN  --   --   --   --  1.3  --   --   --   --   --   VANCOTROUGH  --   --   --   --   --   --   --   --   --  37*   < > = values in this interval not displayed.    CrCl cannot be calculated (This lab value cannot be used to calculate CrCl because it is not a number: <0.30).    Allergies  Allergen Reactions  . Ferumoxytol Anaphylaxis and Other (See Comments)    (Feraheme) = "SYNCOPE; patient  tolerated Venofer 10/01/18 s rxn"   . Oxybutynin Other (See Comments)    Hallucinations    . Vancomycin Other (See Comments)    ARF 05-2016 -- affects kidneys    Antimicrobials this admission:  3/2 flagyl x 1 3/3 Cefepime >>3/7 3/7 Zosyn >> 3/4 Vanc >> 3/5,  3/7 >>3/8, 3/10 >> 3/8 Linezolid >> 3/10  Microbiology results:  3/3 BCx: ng-final 3/3 UCx: mult sp-Final 3/4 MRSA PCR: positive 3/7 Flank abscess: mult sp, no Strep A, no Staph aureus  Thank you for allowing pharmacy to be a part of this patient's care.  Minda Ditto PharmD 08/08/2019, 11:13 AM

## 2019-07-28 ENCOUNTER — Inpatient Hospital Stay (HOSPITAL_COMMUNITY): Payer: Medicare Other

## 2019-07-28 DIAGNOSIS — J9602 Acute respiratory failure with hypercapnia: Secondary | ICD-10-CM

## 2019-07-28 DIAGNOSIS — J9601 Acute respiratory failure with hypoxia: Secondary | ICD-10-CM

## 2019-07-28 LAB — CBC WITH DIFFERENTIAL/PLATELET
Abs Immature Granulocytes: 0.06 10*3/uL (ref 0.00–0.07)
Basophils Absolute: 0 10*3/uL (ref 0.0–0.1)
Basophils Relative: 0 %
Eosinophils Absolute: 0 10*3/uL (ref 0.0–0.5)
Eosinophils Relative: 0 %
HCT: 24.6 % — ABNORMAL LOW (ref 39.0–52.0)
Hemoglobin: 7.8 g/dL — ABNORMAL LOW (ref 13.0–17.0)
Immature Granulocytes: 1 %
Lymphocytes Relative: 16 %
Lymphs Abs: 1.3 10*3/uL (ref 0.7–4.0)
MCH: 25.7 pg — ABNORMAL LOW (ref 26.0–34.0)
MCHC: 31.7 g/dL (ref 30.0–36.0)
MCV: 81.2 fL (ref 80.0–100.0)
Monocytes Absolute: 0.6 10*3/uL (ref 0.1–1.0)
Monocytes Relative: 8 %
Neutro Abs: 6 10*3/uL (ref 1.7–7.7)
Neutrophils Relative %: 75 %
Platelets: 43 10*3/uL — ABNORMAL LOW (ref 150–400)
RBC: 3.03 MIL/uL — ABNORMAL LOW (ref 4.22–5.81)
RDW: 18.5 % — ABNORMAL HIGH (ref 11.5–15.5)
WBC: 7.9 10*3/uL (ref 4.0–10.5)
nRBC: 0.3 % — ABNORMAL HIGH (ref 0.0–0.2)

## 2019-07-28 LAB — CREATININE, SERUM: Creatinine, Ser: <0.3 mg/dL — ABNORMAL LOW (ref 0.61–1.24)

## 2019-07-28 LAB — BLOOD GAS, ARTERIAL
Acid-Base Excess: 3.7 mmol/L — ABNORMAL HIGH (ref 0.0–2.0)
Acid-base deficit: 1.2 mmol/L (ref 0.0–2.0)
Bicarbonate: 29.1 mmol/L — ABNORMAL HIGH (ref 20.0–28.0)
Bicarbonate: 26.5 mmol/L (ref 20.0–28.0)
Drawn by: 11249
FIO2: 21
FIO2: 100
MECHVT: 620 mL
O2 Saturation: 91.2 %
O2 Saturation: 99.6 %
PEEP: 5 cmH2O
Patient temperature: 98.6
Patient temperature: 98.7
RATE: 18 resp/min
pCO2 arterial: 90.7 mmHg (ref 32.0–48.0)
pCO2 arterial: 34.2 mmHg (ref 32.0–48.0)
pH, Arterial: 7.133 — CL (ref 7.350–7.450)
pH, Arterial: 7.503 — ABNORMAL HIGH (ref 7.350–7.450)
pO2, Arterial: 72.3 mmHg — ABNORMAL LOW (ref 83.0–108.0)
pO2, Arterial: 326 mmHg — ABNORMAL HIGH (ref 83.0–108.0)

## 2019-07-28 LAB — GLUCOSE, CAPILLARY
Glucose-Capillary: 142 mg/dL — ABNORMAL HIGH (ref 70–99)
Glucose-Capillary: 144 mg/dL — ABNORMAL HIGH (ref 70–99)
Glucose-Capillary: 97 mg/dL (ref 70–99)
Glucose-Capillary: 108 mg/dL — ABNORMAL HIGH (ref 70–99)
Glucose-Capillary: 127 mg/dL — ABNORMAL HIGH (ref 70–99)
Glucose-Capillary: 146 mg/dL — ABNORMAL HIGH (ref 70–99)
Glucose-Capillary: 181 mg/dL — ABNORMAL HIGH (ref 70–99)
Glucose-Capillary: 149 mg/dL — ABNORMAL HIGH (ref 70–99)

## 2019-07-28 LAB — TRIGLYCERIDES: Triglycerides: 125 mg/dL (ref <150)

## 2019-07-28 MED ORDER — FENTANYL 2500MCG IN NS 250ML (10MCG/ML) PREMIX INFUSION
0.0000 ug/h | INTRAVENOUS | Status: DC
  Administered 2019-07-28: 25 ug/h via INTRAVENOUS
  Administered 2019-07-29: 75 ug/h via INTRAVENOUS
  Filled 2019-07-28 (×2): qty 250

## 2019-07-28 MED ORDER — MIDAZOLAM HCL 2 MG/2ML IJ SOLN: 2.0000 mg | INTRAMUSCULAR | Status: DC | PRN

## 2019-07-28 MED ORDER — FENTANYL CITRATE (PF) 100 MCG/2ML IJ SOLN: 50.0000 ug | Freq: Once | INTRAMUSCULAR | Status: AC

## 2019-07-28 MED ORDER — CHLORHEXIDINE GLUCONATE 0.12% ORAL RINSE (MEDLINE KIT)
15.0000 mL | Freq: Two times a day (BID) | OROMUCOSAL | Status: DC
  Administered 2019-07-29 – 2019-07-31 (×5): 15 mL via OROMUCOSAL

## 2019-07-28 MED ORDER — ATROPINE SULFATE 1 MG/10ML IJ SOSY
PREFILLED_SYRINGE | INTRAMUSCULAR | Status: AC
  Filled 2019-07-28: qty 10

## 2019-07-28 MED ORDER — FUROSEMIDE 10 MG/ML IJ SOLN
40.0000 mg | Freq: Once | INTRAMUSCULAR | Status: AC
  Administered 2019-07-28: 40 mg via INTRAVENOUS
  Filled 2019-07-28: qty 4

## 2019-07-28 MED ORDER — ORAL CARE MOUTH RINSE
15.0000 mL | OROMUCOSAL | Status: DC
  Administered 2019-07-28 – 2019-08-01 (×30): 15 mL via OROMUCOSAL

## 2019-07-28 MED ORDER — ETOMIDATE 2 MG/ML IV SOLN
20.0000 mg | Freq: Once | INTRAVENOUS | Status: AC
  Administered 2019-07-28: 20 mg via INTRAVENOUS

## 2019-07-28 MED ORDER — FENTANYL CITRATE (PF) 100 MCG/2ML IJ SOLN
INTRAMUSCULAR | Status: AC
  Administered 2019-07-28: 50 ug via INTRAVENOUS
  Filled 2019-07-28: qty 2

## 2019-07-28 MED ORDER — MIDAZOLAM HCL 2 MG/2ML IJ SOLN: 2.0000 mg | Freq: Once | INTRAMUSCULAR | Status: AC

## 2019-07-28 MED ORDER — ALBUMIN HUMAN 25 % IV SOLN
25.0000 g | Freq: Once | INTRAVENOUS | Status: AC
  Administered 2019-07-28: 25 g via INTRAVENOUS
  Filled 2019-07-28: qty 100

## 2019-07-28 MED ORDER — LACTATED RINGERS IV SOLN: INTRAVENOUS | Status: DC

## 2019-07-28 MED ORDER — ROCURONIUM BROMIDE 50 MG/5ML IV SOLN
4.0000 mg | Freq: Once | INTRAVENOUS | Status: AC
  Administered 2019-07-28: 4 mg via INTRAVENOUS
  Filled 2019-07-28: qty 0.4

## 2019-07-28 MED ORDER — PROPOFOL 1000 MG/100ML IV EMUL
5.0000 ug/kg/min | INTRAVENOUS | Status: DC
  Administered 2019-07-28: 5 ug/kg/min via INTRAVENOUS
  Administered 2019-07-29: 25 ug/kg/min via INTRAVENOUS
  Filled 2019-07-28 (×2): qty 100

## 2019-07-28 MED ORDER — MIDAZOLAM HCL 2 MG/2ML IJ SOLN
INTRAMUSCULAR | Status: AC
  Administered 2019-07-28: 2 mg via INTRAVENOUS
  Filled 2019-07-28: qty 2

## 2019-07-28 NOTE — Progress Notes (Signed)
Patient noted to be less interactive, sleepy  ABG shows acute hypercapnic respiratory failure  Emergently intubated  ABG ordered
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
## 2019-07-28 NOTE — Progress Notes (Signed)
CRITICAL VALUE ALERT  Critical Value:  PH: 7.13; Co2: 90.7  Date & Time Notied:  07/29/2019; [id number]  Provider Notified: Ander Slade  Orders Received/Actions taken: intubation

## 2019-07-28 NOTE — Procedures (Addendum)
Intubation Procedure Note [name] [name] [id number] 1965-12-21  Procedure: Intubation Indications: Respiratory insufficiency  Received 50 mcg fentanyl, 2 of Versed, 20 mg etomidate, 40 mg rocuronium  Procedure Details Consent: Unable to obtain consent because of altered level of consciousness.  Sister was present-intubation discussed with her Time Out: Verified patient identification, verified procedure, site/side was marked, verified correct patient position, special equipment/implants available, medications/allergies/relevent history reviewed, required imaging and test results available.  Performed  Maximum sterile technique was used including cap, gloves, gown, hand hygiene and mask.  4    Evaluation Hemodynamic Status: BP stable throughout; did drop post intubation, O2 sats: stable throughout did drop post intubation Patient's Current Condition: stable Complications: No apparent complications Patient did tolerate procedure well. Chest X-ray ordered to verify placement.  CXR: pending.   [name] [name] [name] [name] 07/28/2019

## 2019-07-28 NOTE — Progress Notes (Signed)
Arterial line showed drop in BP around midnight, Levophed titrated up. Then heart rate went down to the 40's. Recalibrated arterial line and titrated levophed back down which allowed his heart rate to return to 60-70. EKG completed to verify stable rhythm.  Changed dressings with assistance of 4 staff members due to difficulty with turning and hypotension risk. Full bath/CHG and wound care completed. Changed left arterial line and CVP line, zeroed and calibrated. Patient c/o feeling like the NG tube is not right. Measured and listened for placement which were verified, but stopped tube feeding until could verify with xray. Will report same to oncoming RN.

## 2019-07-28 NOTE — Progress Notes (Signed)
RT will hold CPT due to patient being reintubated

## 2019-07-28 NOTE — Progress Notes (Signed)
eLink Physician-Brief Progress Note Patient Name: KEENA CORRIS DOB: [birth year] MRN: [id number]   Date of Service  07/28/2019  HPI/Events of Note  Patient was recently emergently re-intubated within the past 30 minutes (with paralytics) for hypercapnic respiratory failure. Post-intubation he was bradycardic for which he was reportedly administered an amp of atropine. On my evaluation by camera, he is now hypertensive and tachycardic (albeit on levophed 13 mcg/min or 0.16 mcg/kg/min).  SBP 190s-200s. HR 100-110s (ST).   eICU Interventions  Start propofol and fentanyl for post-intubation sedation which should address hypertension and tachycardia.  Suspect brief bradycardia was related to intubation but will need to monitor closely for any recurrence.  Repeat ABG 1 hour post-intubation to ensure vent settings are appropriate.     Intervention Category Major Interventions: Other:  Charlott Rakes 07/28/2019, 7:44 PM

## 2019-07-28 NOTE — Progress Notes (Signed)
Sleepy  Not offering any specific complaints  Obtain ABG Reinitiated LR Given bolus of salt poor albumin

## 2019-07-29 DIAGNOSIS — L089 Local infection of the skin and subcutaneous tissue, unspecified: Secondary | ICD-10-CM

## 2019-07-29 DIAGNOSIS — D638 Anemia in other chronic diseases classified elsewhere: Secondary | ICD-10-CM

## 2019-07-29 LAB — CBC
HCT: 23.4 % — ABNORMAL LOW (ref 39.0–52.0)
HCT: 20.7 % — ABNORMAL LOW (ref 39.0–52.0)
Hemoglobin: 7.6 g/dL — ABNORMAL LOW (ref 13.0–17.0)
Hemoglobin: 6.8 g/dL — CL (ref 13.0–17.0)
MCH: 26.7 pg (ref 26.0–34.0)
MCH: 26.7 pg (ref 26.0–34.0)
MCHC: 32.5 g/dL (ref 30.0–36.0)
MCHC: 32.9 g/dL (ref 30.0–36.0)
MCV: 82.1 fL (ref 80.0–100.0)
MCV: 81.2 fL (ref 80.0–100.0)
Platelets: 67 10*3/uL — ABNORMAL LOW (ref 150–400)
Platelets: 77 10*3/uL — ABNORMAL LOW (ref 150–400)
RBC: 2.85 MIL/uL — ABNORMAL LOW (ref 4.22–5.81)
RBC: 2.55 MIL/uL — ABNORMAL LOW (ref 4.22–5.81)
RDW: 18.2 % — ABNORMAL HIGH (ref 11.5–15.5)
RDW: 18 % — ABNORMAL HIGH (ref 11.5–15.5)
WBC: 12.9 10*3/uL — ABNORMAL HIGH (ref 4.0–10.5)
WBC: 15.4 10*3/uL — ABNORMAL HIGH (ref 4.0–10.5)
nRBC: 0.2 % (ref 0.0–0.2)
nRBC: 0.2 % (ref 0.0–0.2)

## 2019-07-29 LAB — BASIC METABOLIC PANEL
Anion gap: 12 (ref 5–15)
Anion gap: 12 (ref 5–15)
Anion gap: 9 (ref 5–15)
BUN: 55 mg/dL — ABNORMAL HIGH (ref 6–20)
BUN: 54 mg/dL — ABNORMAL HIGH (ref 6–20)
BUN: 53 mg/dL — ABNORMAL HIGH (ref 6–20)
CO2: 29 mmol/L (ref 22–32)
CO2: 28 mmol/L (ref 22–32)
CO2: 27 mmol/L (ref 22–32)
Calcium: 7 mg/dL — ABNORMAL LOW (ref 8.9–10.3)
Calcium: 6.8 mg/dL — ABNORMAL LOW (ref 8.9–10.3)
Calcium: 6.8 mg/dL — ABNORMAL LOW (ref 8.9–10.3)
Chloride: 117 mmol/L — ABNORMAL HIGH (ref 98–111)
Chloride: 120 mmol/L — ABNORMAL HIGH (ref 98–111)
Chloride: 123 mmol/L — ABNORMAL HIGH (ref 98–111)
Creatinine, Ser: 0.43 mg/dL — ABNORMAL LOW (ref 0.61–1.24)
Creatinine, Ser: 0.37 mg/dL — ABNORMAL LOW (ref 0.61–1.24)
Creatinine, Ser: 0.41 mg/dL — ABNORMAL LOW (ref 0.61–1.24)
GFR calc Af Amer: >60 mL/min (ref >60)
GFR calc Af Amer: >60 mL/min (ref >60)
GFR calc Af Amer: >60 mL/min (ref >60)
GFR calc non Af Amer: >60 mL/min (ref >60)
GFR calc non Af Amer: >60 mL/min (ref >60)
GFR calc non Af Amer: >60 mL/min (ref >60)
Glucose, Bld: 150 mg/dL — ABNORMAL HIGH (ref 70–99)
Glucose, Bld: 172 mg/dL — ABNORMAL HIGH (ref 70–99)
Glucose, Bld: 171 mg/dL — ABNORMAL HIGH (ref 70–99)
Potassium: <2 mmol/L — CL (ref 3.5–5.1)
Potassium: <2 mmol/L — CL (ref 3.5–5.1)
Potassium: 2.4 mmol/L — CL (ref 3.5–5.1)
Sodium: 158 mmol/L — ABNORMAL HIGH (ref 135–145)
Sodium: 160 mmol/L — ABNORMAL HIGH (ref 135–145)
Sodium: 159 mmol/L — ABNORMAL HIGH (ref 135–145)

## 2019-07-29 LAB — COMPREHENSIVE METABOLIC PANEL
ALT: 24 U/L (ref 0–44)
AST: 25 U/L (ref 15–41)
Albumin: 1.8 g/dL — ABNORMAL LOW (ref 3.5–5.0)
Alkaline Phosphatase: 156 U/L — ABNORMAL HIGH (ref 38–126)
Anion gap: 10 (ref 5–15)
BUN: 54 mg/dL — ABNORMAL HIGH (ref 6–20)
CO2: 29 mmol/L (ref 22–32)
Calcium: 6.9 mg/dL — ABNORMAL LOW (ref 8.9–10.3)
Chloride: 116 mmol/L — ABNORMAL HIGH (ref 98–111)
Creatinine, Ser: 0.39 mg/dL — ABNORMAL LOW (ref 0.61–1.24)
GFR calc Af Amer: >60 mL/min (ref >60)
GFR calc non Af Amer: >60 mL/min (ref >60)
Glucose, Bld: 153 mg/dL — ABNORMAL HIGH (ref 70–99)
Potassium: <2 mmol/L — CL (ref 3.5–5.1)
Sodium: 155 mmol/L — ABNORMAL HIGH (ref 135–145)
Total Bilirubin: 0.8 mg/dL (ref 0.3–1.2)
Total Protein: 4.6 g/dL — ABNORMAL LOW (ref 6.5–8.1)

## 2019-07-29 LAB — POCT I-STAT, CHEM 8
BUN: 54 mg/dL — ABNORMAL HIGH (ref 6–20)
Calcium, Ion: 1.05 mmol/L — ABNORMAL LOW (ref 1.15–1.40)
Chloride: 120 mmol/L — ABNORMAL HIGH (ref 98–111)
Creatinine, Ser: 0.3 mg/dL — ABNORMAL LOW (ref 0.61–1.24)
Glucose, Bld: 157 mg/dL — ABNORMAL HIGH (ref 70–99)
HCT: 20 % — ABNORMAL LOW (ref 39.0–52.0)
Hemoglobin: 6.8 g/dL — CL (ref 13.0–17.0)
Potassium: 2.5 mmol/L — CL (ref 3.5–5.1)
Sodium: 159 mmol/L — ABNORMAL HIGH (ref 135–145)
TCO2: 30 mmol/L (ref 22–32)

## 2019-07-29 LAB — CREATININE, SERUM
Creatinine, Ser: 0.47 mg/dL — ABNORMAL LOW (ref 0.61–1.24)
GFR calc Af Amer: >60 mL/min (ref >60)
GFR calc non Af Amer: >60 mL/min (ref >60)

## 2019-07-29 LAB — CBC WITH DIFFERENTIAL/PLATELET
Abs Immature Granulocytes: 0.11 10*3/uL — ABNORMAL HIGH (ref 0.00–0.07)
Basophils Absolute: 0 10*3/uL (ref 0.0–0.1)
Basophils Relative: 0 %
Eosinophils Absolute: 0 10*3/uL (ref 0.0–0.5)
Eosinophils Relative: 0 %
HCT: 24 % — ABNORMAL LOW (ref 39.0–52.0)
Hemoglobin: 7.8 g/dL — ABNORMAL LOW (ref 13.0–17.0)
Immature Granulocytes: 1 %
Lymphocytes Relative: 14 %
Lymphs Abs: 2 10*3/uL (ref 0.7–4.0)
MCH: 26.7 pg (ref 26.0–34.0)
MCHC: 32.5 g/dL (ref 30.0–36.0)
MCV: 82.2 fL (ref 80.0–100.0)
Monocytes Absolute: 0.8 10*3/uL (ref 0.1–1.0)
Monocytes Relative: 6 %
Neutro Abs: 11.8 10*3/uL — ABNORMAL HIGH (ref 1.7–7.7)
Neutrophils Relative %: 79 %
Platelets: 75 10*3/uL — ABNORMAL LOW (ref 150–400)
RBC: 2.92 MIL/uL — ABNORMAL LOW (ref 4.22–5.81)
RDW: 18.2 % — ABNORMAL HIGH (ref 11.5–15.5)
WBC: 14.7 10*3/uL — ABNORMAL HIGH (ref 4.0–10.5)
nRBC: 0 % (ref 0.0–0.2)

## 2019-07-29 LAB — GLUCOSE, CAPILLARY
Glucose-Capillary: 112 mg/dL — ABNORMAL HIGH (ref 70–99)
Glucose-Capillary: 119 mg/dL — ABNORMAL HIGH (ref 70–99)
Glucose-Capillary: 134 mg/dL — ABNORMAL HIGH (ref 70–99)
Glucose-Capillary: 151 mg/dL — ABNORMAL HIGH (ref 70–99)
Glucose-Capillary: 154 mg/dL — ABNORMAL HIGH (ref 70–99)
Glucose-Capillary: 157 mg/dL — ABNORMAL HIGH (ref 70–99)

## 2019-07-29 LAB — PREALBUMIN
Prealbumin: 12.4 mg/dL — ABNORMAL LOW (ref 18–38)
Prealbumin: 12 mg/dL — ABNORMAL LOW (ref 18–38)

## 2019-07-29 LAB — VANCOMYCIN, RANDOM
Vancomycin Rm: 25
Vancomycin Rm: 24

## 2019-07-29 LAB — PREPARE RBC (CROSSMATCH)

## 2019-07-29 MED ORDER — POTASSIUM CHLORIDE 20 MEQ/15ML (10%) PO SOLN
40.0000 meq | Freq: Three times a day (TID) | ORAL | Status: AC
  Administered 2019-07-29 (×3): 40 meq via ORAL
  Filled 2019-07-29 (×3): qty 30

## 2019-07-29 MED ORDER — POTASSIUM CHLORIDE 10 MEQ/50ML IV SOLN
10.0000 meq | Freq: Once | INTRAVENOUS | Status: AC
  Administered 2019-07-29: 10 meq via INTRAVENOUS
  Filled 2019-07-29: qty 50

## 2019-07-29 MED ORDER — PIVOT 1.5 CAL PO LIQD
1000.0000 mL | ORAL | Status: DC
  Administered 2019-07-30 – 2019-07-31 (×3): 1000 mL
  Filled 2019-07-29 (×4): qty 1000

## 2019-07-29 MED ORDER — PIVOT 1.5 CAL PO LIQD: 1000.0000 mL | ORAL | Status: DC

## 2019-07-29 MED ORDER — HYDROCORTISONE NA SUCCINATE PF 100 MG IJ SOLR: 50.0000 mg | Freq: Every day | INTRAMUSCULAR | Status: DC

## 2019-07-29 MED ORDER — PRO-STAT SUGAR FREE PO LIQD
30.0000 mL | Freq: Three times a day (TID) | ORAL | Status: DC
  Administered 2019-07-29 – 2019-07-30 (×4): 30 mL
  Filled 2019-07-29 (×4): qty 30

## 2019-07-29 MED ORDER — POTASSIUM CHLORIDE 10 MEQ/50ML IV SOLN
10.0000 meq | INTRAVENOUS | Status: AC
  Administered 2019-07-29 – 2019-07-30 (×6): 10 meq via INTRAVENOUS
  Filled 2019-07-29 (×6): qty 50

## 2019-07-29 MED ORDER — POTASSIUM CHLORIDE 10 MEQ/50ML IV SOLN
10.0000 meq | INTRAVENOUS | Status: AC
  Administered 2019-07-29 (×6): 10 meq via INTRAVENOUS
  Filled 2019-07-29 (×6): qty 50

## 2019-07-29 MED ORDER — SODIUM CHLORIDE 0.9% IV SOLUTION: Freq: Once | INTRAVENOUS | Status: AC

## 2019-07-29 MED ORDER — MAGNESIUM SULFATE 4 GM/100ML IV SOLN
4.0000 g | Freq: Once | INTRAVENOUS | Status: AC
  Administered 2019-07-29: 4 g via INTRAVENOUS
  Filled 2019-07-29: qty 100

## 2019-07-29 MED ORDER — GERHARDT'S BUTT CREAM
TOPICAL_CREAM | Freq: Every day | CUTANEOUS | Status: DC | PRN
  Filled 2019-07-29: qty 1

## 2019-07-29 MED ORDER — DEXMEDETOMIDINE HCL IN NACL 200 MCG/50ML IV SOLN
0.4000 ug/kg/h | INTRAVENOUS | Status: DC
  Administered 2019-07-29 (×2): 0.4 ug/kg/h via INTRAVENOUS
  Administered 2019-07-30 (×3): 0.5 ug/kg/h via INTRAVENOUS
  Administered 2019-07-30: 01:00:00 0.4 ug/kg/h via INTRAVENOUS
  Administered 2019-07-31: 0.7 ug/kg/h via INTRAVENOUS
  Administered 2019-07-31: 0.4 ug/kg/h via INTRAVENOUS
  Administered 2019-07-31: 0.7 ug/kg/h via INTRAVENOUS
  Administered 2019-07-31: 0.5 ug/kg/h via INTRAVENOUS
  Administered 2019-08-01: 0.7 ug/kg/h via INTRAVENOUS
  Administered 2019-08-01: 0.5 ug/kg/h via INTRAVENOUS
  Filled 2019-07-29: qty 50
  Filled 2019-07-29: qty 100
  Filled 2019-07-29 (×12): qty 50

## 2019-07-29 MED ORDER — VANCOMYCIN HCL 1250 MG/250ML IV SOLN
1250.0000 mg | Freq: Once | INTRAVENOUS | Status: AC
  Administered 2019-07-29: 1250 mg via INTRAVENOUS
  Filled 2019-07-29: qty 250

## 2019-07-29 NOTE — Progress Notes (Signed)
[facility] Progress Note Patient Name: [name] [name] DOB: 12-03-1965 MRN: [id number]   Date of Service  07/29/2019  HPI/Events of Note  Hemoglobin 6.8 gm %  eICU Interventions  Transfuse  1 unit PRBC        Jakia Kennebrew U Arbell Wycoff 07/29/2019, 10:09 PM

## 2019-07-29 NOTE — Progress Notes (Signed)
[facility] Progress Note Patient Name: [name] [name] DOB: [birth year] MRN: [id number]   Date of Service  07/29/2019  HPI/Events of Note  BMP highly abnormal (K < 2.0) and Na 158.   eICU Interventions  Difficult to discern if this chem panel is accurate or not since there have not been labs drawn in several days. I have spoken to the bedside RN and we will redraw them STAT.  Out of an abundance of caution (in case BMP is accurate), we will start administering KCl IV (just 10 mEq via central line) immediately after repeat labs have been drawn.     Intervention Category Major Interventions: Electrolyte abnormality - evaluation and management  Marily Lente Anglia Blakley 07/29/2019, 6:10 AM

## 2019-07-29 NOTE — Progress Notes (Signed)
CRITICAL VALUE ALERT  Critical Value:  K- less than 2  Date & Time Notied:  07/29/19, [id number]  Provider Notified: Lynnae Prude RN  Orders Received/Actions taken: Awaiting orders

## 2019-07-29 NOTE — Progress Notes (Signed)
Pharmacy Antibiotic Note  [name] [name] is a 54 y.o. male admitted on 08/11/2019 with extensive soft tissue infection, necrotizing fasciitis, osteomyelitis post debridement/washout 07/25/2019.  Pharmacy has been consulted for Vancomycin dosing. SCr remains low (Quadriplegia) with Vanc random level 24 this morning.  Would re-dose when levels < 20.  Will plan to wait another 12 hours before re-dosing.  Plan: Continue Zosyn per MD. Vancomycin 1250mg  IV x1 dose today at 22:00 Further dosing based on renal function and vancomycin levels.  Measure Vanc random level before next dose. Goal AUC = 400 - 550. Follow up renal function, culture results, and clinical course.   Height: 6' (182.9 cm) Weight: 175 lb 14.8 oz (79.8 kg) IBW/kg (Calculated) : 77.6  Temp (24hrs), Avg:99 F (37.2 C), Min:98.3 F (36.8 C), Max:99.9 F (37.7 C)  Recent Labs  Lab 07/24/19 0407 07/24/19 1053 07/25/19 0447 07/26/19 0356 07/26/19 0403 08/10/2019 0315 08/14/2019 1017 07/28/19 0351 07/29/19 0433 07/29/19 0617  WBC   < >  --  9.7  --  7.4  --   --  7.9 12.9* 14.7*  CREATININE   < >  --  <0.30* 0.33*  --  <0.30*  --  <0.30* 0.43*  0.47*  --   LATICACIDVEN  --  1.3  --   --   --   --   --   --   --   --   VANCOTROUGH  --   --   --   --   --   --  37*  --   --   --   VANCORANDOM  --   --   --   --   --   --   --   --  25 24   < > = values in this interval not displayed.    Estimated Creatinine Clearance: 117.2 mL/min (A) (by C-G formula based on SCr of 0.47 mg/dL (L)).    Allergies  Allergen Reactions  . Ferumoxytol Anaphylaxis and Other (See Comments)    (Feraheme) = "SYNCOPE; patient tolerated Venofer 10/01/18 s rxn"   . Oxybutynin Other (See Comments)    Hallucinations    . Vancomycin Other (See Comments)    ARF 05-2016 -- affects kidneys Would dose based on levels     Antimicrobials this admission:  3/2 flagyl x 1 3/3 Cefepime >>3/7 3/7 Zosyn >> 3/4 Vanc >> 3/5,  3/7 >>3/8, 3/10 >> 3/8 Linezolid  >> 3/10  Dose changes: 3/13 10:17 Vanc trough: 37 (Dose 3/13 at 10:25) - hold further doses 3/15 06:17 Vanc, rand: 24 (44 hour level)  Microbiology results:  3/3 BCx: ng-final 3/3 UCx: mult sp-Final 3/4 MRSA PCR: positive 3/7 Flank abscess: mult sp, no Strep A, no Staph aureus-final  Thank you for allowing pharmacy to be a part of this patient's care.  Gretta Arab PharmD, BCPS Pager: [phone] 07/29/2019 12:55 PM
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
## 2019-07-29 NOTE — Progress Notes (Signed)
CRITICAL VALUE ALERT  Critical Value:  Potassium < 2.0  Date & Time Notied:  07/29/2019 17:45  Provider Notified: Dr.Icard  Orders Received/Actions taken: Awaiting orders at this time.
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
## 2019-07-29 NOTE — Progress Notes (Signed)
SLP Cancellation Note  Patient Details Name: [name] [name] MRN: [id number] DOB: 1965/07/09   Cancelled treatment:       Reason Eval/Treat Not Completed: Other (comment);Medical issues which prohibited therapy(pt now reintubated)  Kathleen Lime, MS [facility] SLP Acute Rehab Services Office [phone]  Macario Golds 07/29/2019, 10:50 AM

## 2019-07-29 NOTE — Progress Notes (Signed)
Dr. Valeta Harms notified of BMET readings from 1445 draw. Ordered to pull ISTAT from arterial line.   Results called to Dr. Valeta Harms:  K 2.5 Na 159 Hgb 6.8  Dr. Valeta Harms requested to have same arterial sample sent to lab for confirmation prior to ordering correction. He recommends 6 runs of K and a unit of blood should the same results occur. Will pass on to nightshift RN for continuation of care and orders from night coverage MD.
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
## 2019-07-29 NOTE — Progress Notes (Signed)
Nutrition Follow-up  DOCUMENTATION CODES:   Not applicable  INTERVENTION:  - will decrease TF regimen: Pivot 1.5 @ 20 ml/hr to advance by 10 ml every 24 hours to reach goal rate of 60 ml/hr with 30 ml prostat TID. - at goal rate, this regimen will provide 2460 kcal, 180 grams protein, and 1093 ml free water.  - free water flush, if desired, to be per MD/NP.   Monitor magnesium, potassium, and phosphorus daily for at least 3 days, MD to replete as needed, as pt is at risk for refeeding syndrome given poor nutrition status for >2 weeks, current severe hypokalemia.   NUTRITION DIAGNOSIS:   Increased nutrient needs related to acute illness, wound healing, post-op healing as evidenced by estimated needs. -ongoing  GOAL:   Patient will meet greater than or equal to 90% of their needs -unmet at this time  MONITOR:   Vent status, TF tolerance, Labs, Weight trends, Skin  REASON FOR ASSESSMENT:   Ventilator  ASSESSMENT:   54 y.o. male with medical history significant of quadriplegia, suprapubic catheter, stage 4 sacral decubitus, obesity, chronic hypotension on midodrine, recurrent UTIs. Patient went to Urologist's office on 3/3 and had suprapubic catheter exchanged. After this he developed tachycardia and hypotension and was sent to the ED.  Significant Events: 3/3- admission 3/4- initial RD assessment 3/7- OR for excision and extensive debridement of lower back, R chest wall, R flank, R anterior abdominal wall; excision of L lower back 3/7- NGT placed in L nare 3/9- return to OR 3/10- TF started at 20 ml/hr 3/13- extubation 3/14- re-intubated   Patient was re-intubated yesterday at 1910 and remains intubated with NGT in place. He is receiving Pivot 1.5 @ 40 ml/hr with 60 ml prostat TID. This regimen is providing 2040 kcal, 180 grams protein, and 729 ml free water. Weight trending up from admission date of 3/3 until 3/11 and has been trending down since 3/12. Weight today stable  from yesterday. Flow sheet documentation indicates mild edema to RUE, moderate pitting edema to LUE, and deep pitting edema to BLE.   Re-estimated kcal need based on extensive nature of wounds, no plan for extubation at this time.  Able to talk with RN at bedside.  Infectious Disease and Surgery following.   Per notes: -   Patient is currently intubated on ventilator support MV: 11.1 L/min Temp (24hrs), Avg:99.8 F (37.7 C), Min:98.7 F (37.1 C), Max:100.8 F (38.2 C) Propofol: none BP: 106/57 and MAP: 71  Labs reviewed; CBGs: 134, 157, and 151 mg/dl, Na: 155 mmol/l, K: <2 mmol/l, Cl: 116 mmol/l, BUN: 54 mmol/l, creatinine: 0.39 mg/dl, Ca: 6.9 mg/dl, Alk Phos elevated.  Medications reviewed; 300 mg ferrous sulfate/day, 50 mg solu-cortef/day, sliding scale novolog, 15 ml multivitamin/day, 40 mg IV protonix/day, 10 mEq IV KCl x6 runs 3/15, 40 mEq KCl per NGT x3 doses 3/15. IVF; LR @ 50 ml/hr. Drips; precedex @ 0.4 mcg/kg/hr, fentanyl @ 200 mcg/hr, levo @ 3 mcg/min.     Diet Order:   Diet Order            Diet NPO time specified  Diet effective midnight              EDUCATION NEEDS:   No education needs have been identified at this time  Skin:  Skin Assessment: Skin Integrity Issues: Other: Left trochanter: 5cm x 4cm (100% slough moist); Right flank: 3cm x 3cm Unstageable (100% nonviable); Right lateral chest: 9cm x 7cm x 0.1cm; Right thoracic  area: 4cm x 3cm x 0.1cm (100% necrotic); Right thoracic area distal: 5cm x 2cm x 0.2cm (50% nonviable/50% pale non-granual); Right abdominal area: 3cmx 4cm x 3cm (80% pale, non granular/20% yellow/green); Sacrum/buttocks/ITs: 26cm x 30cm x 0.4cm (75% red, 25% nonviable; area will not heal at this point); Scattered partial thickness ulcers over the right hip: pale with fibrinous material, dry; Left medial heel: 3cm x 3cm x 0cm (100% hard stable eschar)  Last BM:  3/15  Height:   Ht Readings from Last 1 Encounters:  07/18/19 6' (1.829  m)    Weight:   Wt Readings from Last 1 Encounters:  07/29/19 79.8 kg    Ideal Body Weight:  80.9 kg  BMI:  Body mass index is 23.86 kg/m.  Estimated Nutritional Needs:   Kcal:  2400-2600 kcal  Protein:  164-199 grams (2.3-2.8 grams/kg)  Fluid:  >/= 2.5 L/day     [name], MS, RD, LDN, CNSC Inpatient Clinical Dietitian RD pager # available in AMION  After hours/weekend pager # available in [facility]

## 2019-07-30 ENCOUNTER — Inpatient Hospital Stay (HOSPITAL_COMMUNITY): Payer: Medicare Other

## 2019-07-30 DIAGNOSIS — L89119 Pressure ulcer of right upper back, unspecified stage: Secondary | ICD-10-CM

## 2019-07-30 DIAGNOSIS — T07XXXA Unspecified multiple injuries, initial encounter: Secondary | ICD-10-CM

## 2019-07-30 DIAGNOSIS — G8253 Quadriplegia, C5-C7 complete: Secondary | ICD-10-CM

## 2019-07-30 DIAGNOSIS — E43 Unspecified severe protein-calorie malnutrition: Secondary | ICD-10-CM

## 2019-07-30 DIAGNOSIS — J9600 Acute respiratory failure, unspecified whether with hypoxia or hypercapnia: Secondary | ICD-10-CM

## 2019-07-30 LAB — CBC WITH DIFFERENTIAL/PLATELET
Abs Immature Granulocytes: 0.13 10*3/uL — ABNORMAL HIGH (ref 0.00–0.07)
Basophils Absolute: 0 10*3/uL (ref 0.0–0.1)
Basophils Relative: 0 %
Eosinophils Absolute: 0 10*3/uL (ref 0.0–0.5)
Eosinophils Relative: 0 %
HCT: 25.4 % — ABNORMAL LOW (ref 39.0–52.0)
Hemoglobin: 8.4 g/dL — ABNORMAL LOW (ref 13.0–17.0)
Immature Granulocytes: 1 %
Lymphocytes Relative: 18 %
Lymphs Abs: 3 10*3/uL (ref 0.7–4.0)
MCH: 27.3 pg (ref 26.0–34.0)
MCHC: 33.1 g/dL (ref 30.0–36.0)
MCV: 82.5 fL (ref 80.0–100.0)
Monocytes Absolute: 0.5 10*3/uL (ref 0.1–1.0)
Monocytes Relative: 3 %
Neutro Abs: 13.2 10*3/uL — ABNORMAL HIGH (ref 1.7–7.7)
Neutrophils Relative %: 78 %
Platelets: 86 10*3/uL — ABNORMAL LOW (ref 150–400)
RBC: 3.08 MIL/uL — ABNORMAL LOW (ref 4.22–5.81)
RDW: 17.2 % — ABNORMAL HIGH (ref 11.5–15.5)
WBC: 16.9 10*3/uL — ABNORMAL HIGH (ref 4.0–10.5)
nRBC: 0 % (ref 0.0–0.2)

## 2019-07-30 LAB — GLUCOSE, CAPILLARY
Glucose-Capillary: 109 mg/dL — ABNORMAL HIGH (ref 70–99)
Glucose-Capillary: 110 mg/dL — ABNORMAL HIGH (ref 70–99)
Glucose-Capillary: 115 mg/dL — ABNORMAL HIGH (ref 70–99)
Glucose-Capillary: 126 mg/dL — ABNORMAL HIGH (ref 70–99)
Glucose-Capillary: 127 mg/dL — ABNORMAL HIGH (ref 70–99)
Glucose-Capillary: 132 mg/dL — ABNORMAL HIGH (ref 70–99)
Glucose-Capillary: 133 mg/dL — ABNORMAL HIGH (ref 70–99)
Glucose-Capillary: 140 mg/dL — ABNORMAL HIGH (ref 70–99)
Glucose-Capillary: 142 mg/dL — ABNORMAL HIGH (ref 70–99)
Glucose-Capillary: 142 mg/dL — ABNORMAL HIGH (ref 70–99)
Glucose-Capillary: 143 mg/dL — ABNORMAL HIGH (ref 70–99)
Glucose-Capillary: 147 mg/dL — ABNORMAL HIGH (ref 70–99)
Glucose-Capillary: 148 mg/dL — ABNORMAL HIGH (ref 70–99)
Glucose-Capillary: 157 mg/dL — ABNORMAL HIGH (ref 70–99)
Glucose-Capillary: 164 mg/dL — ABNORMAL HIGH (ref 70–99)

## 2019-07-30 LAB — BASIC METABOLIC PANEL
Anion gap: 8 (ref 5–15)
BUN: 52 mg/dL — ABNORMAL HIGH (ref 6–20)
CO2: 29 mmol/L (ref 22–32)
Calcium: 6.8 mg/dL — ABNORMAL LOW (ref 8.9–10.3)
Chloride: 124 mmol/L — ABNORMAL HIGH (ref 98–111)
Creatinine, Ser: 0.42 mg/dL — ABNORMAL LOW (ref 0.61–1.24)
GFR calc Af Amer: >60 mL/min (ref >60)
GFR calc non Af Amer: >60 mL/min (ref >60)
Glucose, Bld: 137 mg/dL — ABNORMAL HIGH (ref 70–99)
Potassium: 3 mmol/L — ABNORMAL LOW (ref 3.5–5.1)
Sodium: 161 mmol/L (ref 135–145)

## 2019-07-30 LAB — COMPREHENSIVE METABOLIC PANEL
ALT: 23 U/L (ref 0–44)
AST: 30 U/L (ref 15–41)
Albumin: 2.1 g/dL — ABNORMAL LOW (ref 3.5–5.0)
Alkaline Phosphatase: 137 U/L — ABNORMAL HIGH (ref 38–126)
Anion gap: 11 (ref 5–15)
BUN: 51 mg/dL — ABNORMAL HIGH (ref 6–20)
CO2: 27 mmol/L (ref 22–32)
Calcium: 7.3 mg/dL — ABNORMAL LOW (ref 8.9–10.3)
Chloride: 124 mmol/L — ABNORMAL HIGH (ref 98–111)
Creatinine, Ser: 0.46 mg/dL — ABNORMAL LOW (ref 0.61–1.24)
GFR calc Af Amer: >60 mL/min (ref >60)
GFR calc non Af Amer: >60 mL/min (ref >60)
Glucose, Bld: 121 mg/dL — ABNORMAL HIGH (ref 70–99)
Potassium: 3.4 mmol/L — ABNORMAL LOW (ref 3.5–5.1)
Sodium: 162 mmol/L (ref 135–145)
Total Bilirubin: 1.3 mg/dL — ABNORMAL HIGH (ref 0.3–1.2)
Total Protein: 4.8 g/dL — ABNORMAL LOW (ref 6.5–8.1)

## 2019-07-30 LAB — MAGNESIUM: Magnesium: 1.7 mg/dL (ref 1.7–2.4)

## 2019-07-30 LAB — PHOSPHORUS: Phosphorus: <1 mg/dL — CL (ref 2.5–4.6)

## 2019-07-30 MED ORDER — POTASSIUM CHLORIDE 10 MEQ/50ML IV SOLN
10.0000 meq | INTRAVENOUS | Status: AC
  Administered 2019-07-30 (×2): 10 meq via INTRAVENOUS
  Filled 2019-07-30 (×2): qty 50

## 2019-07-30 MED ORDER — ALBUMIN HUMAN 5 % IV SOLN
25.0000 g | Freq: Once | INTRAVENOUS | Status: AC
  Administered 2019-07-30: 25 g via INTRAVENOUS
  Filled 2019-07-30: qty 500

## 2019-07-30 MED ORDER — SODIUM PHOSPHATES 45 MMOLE/15ML IV SOLN: 30.0000 mmol | Freq: Once | INTRAVENOUS | Status: DC

## 2019-07-30 MED ORDER — POTASSIUM CL IN DEXTROSE 5% 20 MEQ/L IV SOLN
20.0000 meq | INTRAVENOUS | Status: DC
  Administered 2019-07-30 – 2019-07-31 (×2): 20 meq via INTRAVENOUS
  Filled 2019-07-30 (×4): qty 1000

## 2019-07-30 MED ORDER — THIAMINE HCL 100 MG PO TABS
100.0000 mg | ORAL_TABLET | Freq: Every day | ORAL | Status: DC
  Administered 2019-07-30 – 2019-08-01 (×3): 100 mg via ORAL
  Filled 2019-07-30 (×3): qty 1

## 2019-07-30 MED ORDER — FREE WATER
200.0000 mL | Status: DC
  Administered 2019-07-30 (×2): 200 mL

## 2019-07-30 MED ORDER — POTASSIUM PHOSPHATES 15 MMOLE/5ML IV SOLN
30.0000 mmol | Freq: Once | INTRAVENOUS | Status: AC
  Administered 2019-07-30: 30 mmol via INTRAVENOUS
  Filled 2019-07-30: qty 10

## 2019-07-30 MED ORDER — POTASSIUM CHLORIDE 10 MEQ/50ML IV SOLN
10.0000 meq | INTRAVENOUS | Status: AC
  Administered 2019-07-30 (×4): 10 meq via INTRAVENOUS
  Filled 2019-07-30 (×4): qty 50

## 2019-07-30 MED ORDER — CALCIUM GLUCONATE-NACL 2-0.675 GM/100ML-% IV SOLN
2.0000 g | Freq: Once | INTRAVENOUS | Status: AC
  Administered 2019-07-30: 2000 mg via INTRAVENOUS
  Filled 2019-07-30: qty 100

## 2019-07-30 MED ORDER — FOLIC ACID 1 MG PO TABS
1.0000 mg | ORAL_TABLET | Freq: Every day | ORAL | Status: DC
  Administered 2019-07-30 – 2019-08-01 (×3): 1 mg via ORAL
  Filled 2019-07-30 (×3): qty 1

## 2019-07-30 MED ORDER — FREE WATER
200.0000 mL | Status: DC
  Administered 2019-07-30 – 2019-07-31 (×13): 200 mL
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
## 2019-07-30 NOTE — Progress Notes (Signed)
PCCM:  Long discussion with patients sister regarding his poor prognosis.   Patient is not a surgical candidate. His skin is denuding and sloughing off into the bed and with every wound change. We are unable to keep up with his insensible volume losses.  I have spoke with palliative care.   I believe that we have approached an end point in [name] [name]'s care. Ongoing mechanical support for a gentleman that is unable to be positioned off his back and no chance at wound healing. Please review images in chart documented by surgical services.   Transition to comfort care measures is the only appropriate next step and I have requested for the family to gather and make these decisions as to not let [name] [name] suffer any longer.   We appreciate the help from palliative services.   22 mins spent with advanced care planning time.   [name] Nash, DO [place] [facility] 07/30/2019 10:47 AM
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
## 2019-07-30 NOTE — Progress Notes (Signed)
[name] Progress Note Patient Name: [name] [name] DOB: 1966-02-14 MRN: [id number]   Date of Service  07/30/2019  HPI/Events of Note  K+ 3.0, Ca == 1.05, Na+ 161  eICU Interventions  KCL 10 meq iv Q 1 hour x 4 doses, Calcium gluconate 2 gm iv x 1, Albumin 5 % 25 gm iv x 1, Free water via feeding tube 200 ml Q 4 hours.        Kerry Kass Felice Hope 07/30/2019, 1:35 AM

## 2019-07-30 NOTE — Progress Notes (Signed)
Nutrition Follow-up  DOCUMENTATION CODES:   Not applicable  INTERVENTION:  - hold Pivot 1.5 at 20 ml/hr today. - continue 30 ml prostat TID. - free water flush to continue to be per MD/NP given hypernatremia, degree of edema, and fluid losses from wounds.   Monitor magnesium, potassium, and phosphorus daily for at least 3 days, MD to replete as needed, as pt is at risk for refeeding syndrome/may currently be refeeding given inadequate nutrition for >2 weeks, current mild hypokalemia and severe hypophosphatemia.    NUTRITION DIAGNOSIS:   Increased nutrient needs related to acute illness, wound healing, post-op healing as evidenced by estimated needs. -ongoing  GOAL:   Patient will meet greater than or equal to 90% of their needs -unmet at this time  MONITOR:   Vent status, TF tolerance, Labs, Weight trends, Skin  ASSESSMENT:   54 y.o. male with medical history significant of quadriplegia, suprapubic catheter, stage 4 sacral decubitus, obesity, chronic hypotension on midodrine, recurrent UTIs. Patient went to Urologist's office on 3/3 and had suprapubic catheter exchanged. After this he developed tachycardia and hypotension and was sent to the ED.  Significant Events: 3/3- admission 3/4- initial RD assessment 3/7- OR for excision and extensive debridement of lower back, R chest wall, R flank, R anterior abdominal wall; excision of L lower back 3/7- NGT placed in L nare 3/9- return to OR 3/10- TF started at 20 ml/hr 3/13- extubation 3/14- re-intubated    Patient remains intubated with L nare NGT in place. Recommend NGT be replaced with small bore NGT; able to talk with [name] Dibbles, NP, who reports that meaningful GOC discussion to occur today so plan to hold off at this time pending meeting. Of note, Dr. Juline Patch note at 1047 indicates discussion had with family to gather and make decisions regarding transition to comfort care.   Weight is trending back up and is +2 kg/4 lb from  yesterday to day. No update on edema today but yesterday was documented as mild edema to UE, moderate pitting edema to LUE, and deep pitting edema to BLE.   He is currently receiving Pivot 1.5 @ 20 ml/hr with 30 ml prostat TID. This regimen is providing 1020 kcal, 90 grams protein, and 401 ml free water. Order placed at 0700 today for 200 ml free water every 2 hours (1200 ml/day).    Patient is currently intubated on ventilator support MV: 11.1 L/min Temp (24hrs), Avg:99.5 F (37.5 C), Min:98.2 F (36.8 C), Max:100.8 F (38.2 C) Propofol: none BP: 109/53 and MAP: 70 (from ART)  Labs reviewed; CBGs: 109 and 110 mg/dl, Na: 162 mmol/l, K: 3.4 mmol/l, Cl: 124 mmol/l, BUN: 51 mg/dl, creatinine: 0.46 mg/dl, Ca: 7.3 mg/dl, Phos: <1 mg/dl, alk phos elevated.  Medications reviewed; 25 g albumin x1 dose 3/16, 300 mg ferrous sulfate/day, 1 mg folvite/day, sliding scale novolog, 4 g IV Mg sulfate x1 run 3/15, 15 ml multivitamin per NGT/day, 10 mEq IV KCl x6 runs 3/15 and x4 runs 3/16, 40 mEq KCl x3 doses 3/15, 100 mg thiamine/day.  IVF; D5-20 mEq IV KCl @ 100 ml/hr (408 kcal).  Drip; precedex @ 0.5 mcg/kg/hr.   Diet Order:   Diet Order            Diet NPO time specified  Diet effective midnight              EDUCATION NEEDS:   No education needs have been identified at this time  Skin:  Skin Assessment: Skin Integrity Issues: Other:  Left trochanter: 5cm x 4cm (100% slough moist); Right flank: 3cm x 3cm Unstageable (100% nonviable); Right lateral chest: 9cm x 7cm x 0.1cm; Right thoracic area: 4cm x 3cm x 0.1cm (100% necrotic); Right thoracic area distal: 5cm x 2cm x 0.2cm (50% nonviable/50% pale non-granual); Right abdominal area: 3cmx 4cm x 3cm (80% pale, non granular/20% yellow/green); Sacrum/buttocks/ITs: 26cm x 30cm x 0.4cm (75% red, 25% nonviable; area will not heal at this point); Scattered partial thickness ulcers over the right hip: pale with fibrinous material, dry; Left medial heel:  3cm x 3cm x 0cm (100% hard stable eschar)  Last BM:  3/16 (200 ml via colostomy)  Height:   Ht Readings from Last 1 Encounters:  07/18/19 6' (1.829 m)    Weight:   Wt Readings from Last 1 Encounters:  07/30/19 81.8 kg    Ideal Body Weight:  80.9 kg  BMI:  Body mass index is 24.46 kg/m.  Estimated Nutritional Needs:   Kcal:  2400-2600 kcal  Protein:  164-199 grams (2.3-2.8 grams/kg)  Fluid:  >/= 2.5 L/day     [name] Matin, MS, RD, LDN, CNSC Inpatient Clinical Dietitian RD pager # available in AMION  After hours/weekend pager # available in [facility]

## 2019-07-31 LAB — TYPE AND SCREEN
ABO/RH(D): B POS
Antibody Screen: NEGATIVE
Unit division: 0

## 2019-07-31 LAB — GLUCOSE, CAPILLARY
Glucose-Capillary: 139 mg/dL — ABNORMAL HIGH (ref 70–99)
Glucose-Capillary: 126 mg/dL — ABNORMAL HIGH (ref 70–99)
Glucose-Capillary: 126 mg/dL — ABNORMAL HIGH (ref 70–99)
Glucose-Capillary: 110 mg/dL — ABNORMAL HIGH (ref 70–99)
Glucose-Capillary: 127 mg/dL — ABNORMAL HIGH (ref 70–99)
Glucose-Capillary: 124 mg/dL — ABNORMAL HIGH (ref 70–99)

## 2019-07-31 LAB — BPAM RBC
Blood Product Expiration Date: 202103282359
ISSUE DATE / TIME: 202103160037
Unit Type and Rh: 1700

## 2019-07-31 LAB — CREATININE, SERUM
Creatinine, Ser: 0.31 mg/dL — ABNORMAL LOW (ref 0.61–1.24)
GFR calc Af Amer: >60 mL/min (ref >60)
GFR calc non Af Amer: >60 mL/min (ref >60)

## 2019-07-31 LAB — VANCOMYCIN, RANDOM: Vancomycin Rm: 10

## 2019-07-31 MED ORDER — POTASSIUM CHLORIDE 20 MEQ PO PACK
40.0000 meq | PACK | Freq: Once | ORAL | Status: AC
  Administered 2019-07-31: 40 meq
  Filled 2019-07-31: qty 2

## 2019-07-31 MED ORDER — FREE WATER
400.0000 mL | Status: DC
  Administered 2019-07-31 – 2019-08-01 (×16): 400 mL

## 2019-07-31 MED ORDER — LACTATED RINGERS IV BOLUS
1000.0000 mL | Freq: Once | INTRAVENOUS | Status: AC
  Administered 2019-07-31: 12:00:00 1000 mL via INTRAVENOUS

## 2019-07-31 MED ORDER — PRO-STAT SUGAR FREE PO LIQD
60.0000 mL | Freq: Three times a day (TID) | ORAL | Status: DC
  Administered 2019-07-31 (×2): 60 mL
  Filled 2019-07-31 (×3): qty 60

## 2019-07-31 MED ORDER — VANCOMYCIN HCL 1250 MG/250ML IV SOLN
1250.0000 mg | Freq: Once | INTRAVENOUS | Status: AC
  Administered 2019-08-01: 1250 mg via INTRAVENOUS
  Filled 2019-07-31: qty 250

## 2019-07-31 NOTE — Progress Notes (Signed)
Pharmacy Antibiotic Note  [name] [name] is a 54 y.o. male admitted on 07/24/2019 with extensive soft tissue infection, necrotizing fasciitis, osteomyelitis post debridement 08/09/2019, and 08/09/2019.  Pharmacy has been consulted for Vancomycin dosing. SCr remains low (Quadriplegia) with WBC trending up to 16.9 (3/16).  Checking random level tonigh, pharmacy to re-dose when levels < 20.  Follow up [place] conversation with family today at 3:30 PM.  Plan: Continue Zosyn per MD. Vancomycin dosing based on renal function and vancomycin levels.  Measure Vanc random level before next dose. Goal AUC = 400 - 550. Follow up renal function, culture results, and clinical course.   Height: 6' (182.9 cm) Weight: 180 lb 12.4 oz (82 kg) IBW/kg (Calculated) : 77.6  Temp (24hrs), Avg:99.1 F (37.3 C), Min:98.1 F (36.7 C), Max:100.3 F (37.9 C)  Recent Labs  Lab 07/24/19 1053 07/25/19 0447 07/26/19 0403 08/10/2019 1017 07/28/19 0351 07/28/19 0351 07/29/19 0433 07/29/19 0433 07/29/19 0617 07/29/19 1436 07/29/19 1812 07/29/19 1813 07/29/19 1954 07/30/19 0010 07/30/19 0435 07/31/19 0417  WBC  --    < >   < >  --  7.9  --  12.9*  --  14.7*  --   --   --  15.4*  --  16.9*  --   CREATININE  --    < >   < >  --  <0.30*   < > 0.43*  0.47*   < > 0.39*   < > 0.30* 0.41*  --  0.42* 0.46* 0.31*  LATICACIDVEN 1.3  --   --   --   --   --   --   --   --   --   --   --   --   --   --   --   VANCOTROUGH  --   --   --  37*  --   --   --   --   --   --   --   --   --   --   --   --   VANCORANDOM  --   --   --   --   --   --  25  --  24  --   --   --   --   --   --   --    < > = values in this interval not displayed.    Estimated Creatinine Clearance: 117.2 mL/min (A) (by C-G formula based on SCr of 0.31 mg/dL (L)).    Allergies  Allergen Reactions  . Ferumoxytol Anaphylaxis and Other (See Comments)    (Feraheme) = "SYNCOPE; patient tolerated Venofer 10/01/18 s rxn"   . Oxybutynin Other (See Comments)   Hallucinations    . Vancomycin Other (See Comments)    ARF 05-2016 -- affects kidneys Would dose based on levels     Antimicrobials this admission:  3/2 flagyl x 1 3/3 Cefepime >>3/7 3/7 Zosyn >> 3/4 Vanc >> 3/5,  3/7 >>3/8, 3/10 >> 3/8 Linezolid >> 3/10  Dose changes: 3/13 10:17 Vanc trough: 37 (Dose 3/13 at 10:25) - hold further doses 3/15 06:17 Vanc random (~44 hour level): 24 3/17 2200 Vanc random (48 hr level): ___  Microbiology results:  3/3 BCx: ng-final 3/3 UCx: mult sp-Final 3/4 MRSA PCR: positive 3/7 Flank abscess: mult sp, no Strep A, no Staph aureus-final  Thank you for allowing pharmacy to be a part of this patient's care.  Gretta Arab PharmD, [place] Pager: [phone]  07/31/2019 9:07 AM

## 2019-07-31 NOTE — Progress Notes (Addendum)
Will be present to evaluate [name] later this afternoon.  Due to patient's fragile state, no surgical management planned at this time. Continue with local wound care as tolerated. Will continue to follow. Please call with any questions or concerns.

## 2019-07-31 NOTE — Progress Notes (Signed)
Brief Pharmacy Note:   77 y/oM on IV antibiotics, Vancomycin and Zosyn, for extensive soft tissue infection, necrotizing fasciitis, osteomyelitis post debridement 08/13/2019, and 08/11/2019. See full note by Gretta Arab, PharmD, BCPS earlier today.   Random Vancomycin level = 10 mcg/mL SCr remains low at 0.31 (quadriplegia)  Plan: Give Vancomycin 1250mg  IV x 1 dose now Continue to dose Vancomycin based on renal function and vancomycin levels. Zosyn dosing per MD F/u DOT   Lindell Spar, PharmD, BCPS Clinical Pharmacist  07/31/2019 11:36 PM

## 2019-07-31 NOTE — Progress Notes (Signed)
Patient blood pressures sustained 80/40s after turning patient. CCM MD [place] notified, 1L LR bolus given per order. Will continue to monitor.

## 2019-07-31 NOTE — Progress Notes (Signed)
Subjective: In bed, NAD with sister and palliative care at bedside.  Patient alert, able to mouth words. Using stylus to communicate.  Objective: Vital signs in last 24 hours: Temp:  [98.1 F (36.7 C)-100.3 F (37.9 C)] 98.6 F (37 C) (03/17 1200) Pulse Rate:  [64-118] 86 (03/17 1147) Resp:  [15-25] 17 (03/17 1500) BP: (75-127)/(49-83) 108/67 (03/17 1500) SpO2:  [96 %-100 %] 100 % (03/17 1340) Arterial Line BP: (80-149)/(40-75) 114/60 (03/17 1500) FiO2 (%):  [40 %] 40 % (03/17 1516) Weight:  [82 kg] 82 kg (03/17 0600) Last BM Date: 07/30/19  Intake/Output from previous day: 03/16 0701 - 03/17 0700 In: 3844.6 [I.V.:1355; NG/GT:1903.7; IV Piggyback:585.9] Out: 2100 [Urine:2000; Stool:100] Intake/Output this shift: Total I/O In: 1993.6 [I.V.:957.3; NG/GT:400; IV Piggyback:636.3] Out: 375 [Urine:300; Stool:75]  General appearance: ill appearing, and ETT in place - ventilated Nose: NG tube in place GI: ostomy bag in place, full of loose stool Extremities: contracted Skin: wounds not evaluated due to patient's status, EMR photos reviewed Neuro: Mouths words to respond to questions, using stylus to communicate with palliative care and family.   Lab Results:  CBC Latest Ref Rng & Units 07/30/2019 07/29/2019 07/29/2019  WBC 4.0 - 10.5 K/uL 16.9(H) 15.4(H) -  Hemoglobin 13.0 - 17.0 g/dL 8.4(L) 6.8(LL) 6.8(LL)  Hematocrit 39.0 - 52.0 % 25.4(L) 20.7(L) 20.0(L)  Platelets 150 - 400 K/uL 86(L) 77(L) -    BMET Recent Labs    07/30/19 0010 07/30/19 0010 07/30/19 0435 07/31/19 0417  NA 161*  --  162*  --   K 3.0*  --  3.4*  --   CL 124*  --  124*  --   CO2 29  --  27  --   GLUCOSE 137*  --  121*  --   BUN 52*  --  51*  --   CREATININE 0.42*   < > 0.46* 0.31*  CALCIUM 6.8*  --  7.3*  --    < > = values in this interval not displayed.   PT/INR No results for input(s): LABPROT, INR in the last 72 hours. ABG Recent Labs    07/28/19 1809 07/28/19 2030  PHART 7.133* 7.503*   HCO3 29.1* 26.5    Studies/Results: DG Abd 1 View  Result Date: 07/30/2019 CLINICAL DATA:  NG tube placement EXAM: ABDOMEN - 1 VIEW COMPARISON:  08/14/2019, chest x-ray today FINDINGS: NG tube tip is within the stomach. Stable bilateral airspace opacities, elevation of the right hemidiaphragm. IMPRESSION: NG tube tip in the stomach. Electronically Signed   By: Rolm Baptise M.D.   On: 07/30/2019 09:59   DG CHEST PORT 1 VIEW  Result Date: 07/30/2019 CLINICAL DATA:  Ventilated patient, ETT position EXAM: PORTABLE CHEST 1 VIEW COMPARISON:  Radiograph 07/28/2019 FINDINGS: Endotracheal tube terminates in the mid trachea, 4 cm from the carina. Transesophageal tube side port is at the GE junction and should be advanced 3-5 cm for optimal function. Right IJ approach central venous catheter tip terminates at the superior cavoatrial junction. Persistent bilateral airspace opacities more focally confluent in the right mid to lower lung with asymmetric elevation of the right hemidiaphragm and bilateral pleural effusions. Cardiomediastinal contours are stable. No pneumothorax. No acute osseous or soft tissue abnormality. Degenerative changes are present in the imaged spine and shoulders. IMPRESSION: 1. Stable bilateral airspace opacities and pleural effusions. 2. Right IJ catheter tip terminates at the superior cavoatrial junction. 3. Left IJ catheter sheath terminates at the level of the left brachiocephalic vein. 4.  Transesophageal tube side port at the GE junction and should be advanced 3-5 cm for optimal function. 5. Endotracheal tube terminates appropriately within the mid trachea. Electronically Signed   By: Lovena Le M.D.   On: 07/30/2019 06:02    Anti-infectives: Anti-infectives (From admission, onward)   Start     Dose/Rate Route Frequency Ordered Stop   07/29/19 2200  vancomycin (VANCOREADY) IVPB 1250 mg/250 mL     1,250 mg 166.7 mL/hr over 90 Minutes Intravenous  Once 07/29/19 1256 07/29/19 2349    08/13/2019 1118  vancomycin variable dose per unstable renal function (pharmacist dosing)      Does not apply See admin instructions 07/30/2019 1118     07/24/19 1000  vancomycin (VANCOREADY) IVPB 1250 mg/250 mL  Status:  Discontinued     1,250 mg 166.7 mL/hr over 90 Minutes Intravenous Every 12 hours 07/24/19 0919 08/14/2019 1111   08/06/2019 1000  linezolid (ZYVOX) IVPB 600 mg  Status:  Discontinued     600 mg 300 mL/hr over 60 Minutes Intravenous Every 12 hours 08/08/2019 0831 07/24/19 0845   08/02/2019 0600  clindamycin (CLEOCIN) IVPB 900 mg  Status:  Discontinued     900 mg 100 mL/hr over 30 Minutes Intravenous On call to O.R. 07/31/2019 1320 07/16/2019 1321   07/19/2019 0600  gentamicin (GARAMYCIN) 360 mg in dextrose 5 % 100 mL IVPB  Status:  Discontinued     5 mg/kg  71.2 kg 109 mL/hr over 60 Minutes Intravenous On call to O.R. 07/20/2019 1320 08/10/2019 1321   07/19/2019 0000  vancomycin (VANCOREADY) IVPB 1250 mg/250 mL  Status:  Discontinued     1,250 mg 166.7 mL/hr over 90 Minutes Intravenous Every 12 hours 08/14/2019 1246 07/30/2019 0831   08/06/2019 1400  vancomycin (VANCOREADY) IVPB 1500 mg/300 mL     1,500 mg 150 mL/hr over 120 Minutes Intravenous  Once 08/09/2019 1230 08/14/2019 1600   07/31/2019 1400  piperacillin-tazobactam (ZOSYN) IVPB 3.375 g     3.375 g 12.5 mL/hr over 240 Minutes Intravenous Every 8 hours 07/23/2019 1321     07/16/2019 1230  vancomycin (VANCOCIN) IVPB 1000 mg/200 mL premix  Status:  Discontinued     1,000 mg 200 mL/hr over 60 Minutes Intravenous  Once 07/20/2019 1225 08/02/2019 1230   07/19/19 0400  vancomycin (VANCOREADY) IVPB 1250 mg/250 mL  Status:  Discontinued     1,250 mg 166.7 mL/hr over 90 Minutes Intravenous Every 12 hours 07/18/19 1457 07/19/19 1011   07/18/19 1600  vancomycin (VANCOREADY) IVPB 1500 mg/300 mL     1,500 mg 150 mL/hr over 120 Minutes Intravenous  Once 07/18/19 1457 07/18/19 1745   07/18/19 0200  ceFEPIme (MAXIPIME) 2 g in sodium chloride 0.9 % 100 mL IVPB   Status:  Discontinued     2 g 200 mL/hr over 30 Minutes Intravenous Every 8 hours 07/18/19 0054 07/20/2019 1321   08/14/2019 1715  ceFEPIme (MAXIPIME) 2 g in sodium chloride 0.9 % 100 mL IVPB     2 g 200 mL/hr over 30 Minutes Intravenous  Once 07/20/2019 1707 07/31/2019 1818   07/28/2019 1715  metroNIDAZOLE (FLAGYL) IVPB 500 mg     500 mg 100 mL/hr over 60 Minutes Intravenous  Once 07/16/2019 1707 08/09/2019 1900      Assessment/Plan:  No surgical management planned at this time due to fragile state. Recommend continued local wound care. We will continue to follow and be available as needed.  Please call with questions or concerns.    LOS:  14 days    Charlies Constable, PA-C 07/31/2019
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
## 2019-07-31 NOTE — Progress Notes (Signed)
NUTRITION NOTE  Full follow-up assessment completed yesterday. Patient remains intubated with NGT in place and is receiving Pivot 1.5 @ 34ml/hr with 30 ml prostat TID and 400 ml free water every 2 hours. This regimen is providing 1020 kcal, 90 grams protein, and 2764 ml free water.  Able to talk with nurse who reports that a follow-up family meeting with additional family members is planned for 1530 today.   Will continue Pivot 1.5 @ 20 ml/hr at this time (goal rate is 60 ml/hr), but will increase prostat to 60 ml TID which will increase feeding regimen to 1320 kcal (55% estimated kcal need), 135 grams protein (82% estimated protein need).   Estimated Nutritional Needs:  Kcal:  2400-2600 kcal Protein:  164-199 grams (2.3-2.8 grams/kg) Fluid:  >/= 2.5 L/day     [name], MS, RD, LDN, CNSC Inpatient Clinical Dietitian RD pager # available in AMION  After hours/weekend pager # available in [facility]

## 2019-08-01 DIAGNOSIS — Z95828 Presence of other vascular implants and grafts: Secondary | ICD-10-CM

## 2019-08-01 DIAGNOSIS — M869 Osteomyelitis, unspecified: Secondary | ICD-10-CM

## 2019-08-01 LAB — CBC WITH DIFFERENTIAL/PLATELET
Abs Immature Granulocytes: 0.05 10*3/uL (ref 0.00–0.07)
Basophils Absolute: 0 10*3/uL (ref 0.0–0.1)
Basophils Relative: 0 %
Eosinophils Absolute: 0 10*3/uL (ref 0.0–0.5)
Eosinophils Relative: 0 %
HCT: 24.5 % — ABNORMAL LOW (ref 39.0–52.0)
Hemoglobin: 8 g/dL — ABNORMAL LOW (ref 13.0–17.0)
Immature Granulocytes: 1 %
Lymphocytes Relative: 24 %
Lymphs Abs: 2.3 10*3/uL (ref 0.7–4.0)
MCH: 27.1 pg (ref 26.0–34.0)
MCHC: 32.7 g/dL (ref 30.0–36.0)
MCV: 83.1 fL (ref 80.0–100.0)
Monocytes Absolute: 0.5 10*3/uL (ref 0.1–1.0)
Monocytes Relative: 5 %
Neutro Abs: 6.8 10*3/uL (ref 1.7–7.7)
Neutrophils Relative %: 70 %
Platelets: 127 10*3/uL — ABNORMAL LOW (ref 150–400)
RBC: 2.95 MIL/uL — ABNORMAL LOW (ref 4.22–5.81)
RDW: 18.5 % — ABNORMAL HIGH (ref 11.5–15.5)
WBC: 9.7 10*3/uL (ref 4.0–10.5)
nRBC: 0.3 % — ABNORMAL HIGH (ref 0.0–0.2)

## 2019-08-01 LAB — GLUCOSE, CAPILLARY
Glucose-Capillary: 76 mg/dL (ref 70–99)
Glucose-Capillary: 84 mg/dL (ref 70–99)
Glucose-Capillary: 112 mg/dL — ABNORMAL HIGH (ref 70–99)
Glucose-Capillary: 105 mg/dL — ABNORMAL HIGH (ref 70–99)

## 2019-08-01 LAB — BASIC METABOLIC PANEL
Anion gap: 7 (ref 5–15)
BUN: 36 mg/dL — ABNORMAL HIGH (ref 6–20)
CO2: 26 mmol/L (ref 22–32)
Calcium: 5.9 mg/dL — CL (ref 8.9–10.3)
Chloride: 111 mmol/L (ref 98–111)
Creatinine, Ser: 0.35 mg/dL — ABNORMAL LOW (ref 0.61–1.24)
GFR calc Af Amer: >60 mL/min (ref >60)
GFR calc non Af Amer: >60 mL/min (ref >60)
Glucose, Bld: 96 mg/dL (ref 70–99)
Potassium: 2.9 mmol/L — ABNORMAL LOW (ref 3.5–5.1)
Sodium: 144 mmol/L (ref 135–145)

## 2019-08-01 LAB — MAGNESIUM: Magnesium: 0.9 mg/dL — CL (ref 1.7–2.4)

## 2019-08-01 MED ORDER — POTASSIUM CHLORIDE 20 MEQ/15ML (10%) PO SOLN
40.0000 meq | ORAL | Status: AC
  Administered 2019-08-01 (×2): 40 meq
  Filled 2019-08-01 (×2): qty 30

## 2019-08-01 MED ORDER — GLYCOPYRROLATE 1 MG PO TABS: 1.0000 mg | ORAL_TABLET | ORAL | Status: DC | PRN

## 2019-08-01 MED ORDER — SODIUM CHLORIDE 0.9 % IV SOLN
6.0000 g | Freq: Once | INTRAVENOUS | Status: AC
  Administered 2019-08-01: 6 g via INTRAVENOUS
  Filled 2019-08-01: qty 12

## 2019-08-01 MED ORDER — PRO-STAT SUGAR FREE PO LIQD
60.0000 mL | Freq: Four times a day (QID) | ORAL | Status: DC
  Administered 2019-08-01 (×2): 60 mL
  Filled 2019-08-01: qty 60

## 2019-08-01 MED ORDER — JUVEN PO PACK
1.0000 | PACK | Freq: Two times a day (BID) | ORAL | Status: DC
  Administered 2019-08-01 (×2): 1
  Filled 2019-08-01: qty 1

## 2019-08-01 MED ORDER — ACETAMINOPHEN 650 MG RE SUPP: 650.0000 mg | Freq: Four times a day (QID) | RECTAL | Status: DC | PRN

## 2019-08-01 MED ORDER — ACETAMINOPHEN 325 MG PO TABS: 650.0000 mg | ORAL_TABLET | Freq: Four times a day (QID) | ORAL | Status: DC | PRN

## 2019-08-01 MED ORDER — DIPHENHYDRAMINE HCL 50 MG/ML IJ SOLN: 25.0000 mg | INTRAMUSCULAR | Status: DC | PRN

## 2019-08-01 MED ORDER — ZINC SULFATE 220 (50 ZN) MG PO CAPS
220.0000 mg | ORAL_CAPSULE | Freq: Every day | ORAL | Status: DC
  Administered 2019-08-01: 15:00:00 220 mg
  Filled 2019-08-01: qty 1

## 2019-08-01 MED ORDER — CALCIUM GLUCONATE-NACL 2-0.675 GM/100ML-% IV SOLN
2.0000 g | Freq: Once | INTRAVENOUS | Status: AC
  Administered 2019-08-01: 2000 mg via INTRAVENOUS
  Filled 2019-08-01: qty 100

## 2019-08-01 MED ORDER — MORPHINE SULFATE (PF) 2 MG/ML IV SOLN: 2.0000 mg | INTRAVENOUS | Status: DC | PRN

## 2019-08-01 MED ORDER — GLYCOPYRROLATE 0.2 MG/ML IJ SOLN: 0.2000 mg | INTRAMUSCULAR | Status: DC | PRN

## 2019-08-01 MED ORDER — POLYVINYL ALCOHOL 1.4 % OP SOLN: 1.0000 [drp] | Freq: Four times a day (QID) | OPHTHALMIC | Status: DC | PRN

## 2019-08-01 MED ORDER — VITAMIN C 500 MG/5ML PO SYRP
500.0000 mg | ORAL_SOLUTION | Freq: Two times a day (BID) | ORAL | Status: DC
  Filled 2019-08-01: qty 5

## 2019-08-15 NOTE — Progress Notes (Signed)
[facility] Progress Note Patient Name: [name] [name] DOB: 04-17-1966 MRN: [id number]   Date of Service  08-05-19  HPI/Events of Note  Pt with lower anterior chest wall discomfort which appeared partially reproducible, EKG is unremarkable.  eICU Interventions  Portable CXR ordered, a.m. labs ordered        Frederik Pear Aug 05, 2019, 3:42 AM

## 2019-08-15 NOTE — Progress Notes (Addendum)
I met pt's family bedside, including pt's sister Clent Ridges. She was especially tearful, as were other family members. Clent Ridges also left her number. It is [phone]. Chaplain provided care of presence, prayer, and questions answered and add'tl support as needed.   I was told by family members that pt's primary caregiver (Ms. [name]) has details for pt's care for final arrangements.  [place], [place]   2019-08-23 2100  Clinical Encounter Type  Visited With Family

## 2019-08-15 NOTE — Progress Notes (Signed)
This RN present when Dr. Valeta Harms spoke with pt and pts sister regarding code status, pt and pts sister agreed with DNR status and plan of care as discussed by Dr. Valeta Harms.

## 2019-08-15 NOTE — Progress Notes (Signed)
[facility] Progress Note Patient Name: [name] [name] DOB: 16-Dec-1965 MRN: [id number]   Date of Service  08/12/2019  HPI/Events of Note  Mg++, Ca++. K+ low  eICU Interventions  ELINK electrolyte replacement  protocol ordered for above electrolytes.        Kerry Kass Kaylanni Ezelle 08/12/19, 5:34 AM

## 2019-08-15 NOTE — Procedures (Signed)
Extubation Procedure Note  Patient Details:   Name: [name] [name] DOB: [birth year] MRN: [id number]   Airway Documentation:  Airway 7.5 mm (Active)  Secured at (cm) 25 cm 08/29/19 1119  Measured From Lips 08-29-2019 1119  Secured Location Left 29-Aug-2019 1119  Secured By [employer] 08-29-2019 1119  Tube Holder Repositioned Yes 2019/08/29 1119  Cuff Pressure (cm H2O) 24 cm H2O 2019/08/29 0744  Site Condition Cool;Dry 08-29-19 0310   Vent end date: 07/26/2019 Vent end time: 1315   Evaluation  O2 sats: 93 (5L [place]) Complications: none Patient tolerated procedure well. Bilateral Breath Sounds: Diminished   Pt able to speak  [name] 29-Aug-2019, 4:31 PM

## 2019-08-15 NOTE — Progress Notes (Addendum)
PCCM Progress note  Called to bedside by nursing stating that patient has had significant decline in status. On arrival to room patient was seen severely hypoxic, bradycardic, hypotensive, and unresponsive. It appears that patient is actively dying. Discussion held with family members at bedside and over the phone with recommendation to transition to full comfort measure at this time to allow [name] [name] to pass peacefully. At present [name] appears to be in no distress or pain currently. Orders placed for to transition to comfort measures.   25 additional minutes dedicated to advanced care [place], NP-C [place] / Pager information can be found on Amion  31-Aug-2019, 5:23 PM

## 2019-08-15 NOTE — Progress Notes (Signed)
Pt extubated placed on 10LNC. Pt alert and oriented, following commands.

## 2019-08-15 NOTE — Progress Notes (Signed)
Fentanyl 68mcg wasted from previous 2mcg administration on August 28, 2019 at 1552. Waste witnessed by Jethro Bastos, RN and Vira Blanco, RN.

## 2019-08-15 NOTE — Progress Notes (Signed)
Pt continued to refuse repositioning body. Pt also refused oral care other than swabs with water.   26: RN notified by RT pt c/o of abdominal pain. Upon assessment, pt told RN he is having chest pain. EKG obtained, unremarkable results. E-Link notified. Pt describes pain as pressure with breathing, E-Link MD notified. See new orders. Will continue to monitor.

## 2019-08-15 NOTE — Progress Notes (Addendum)
PCCM:  Palliative care meeting with patient's sister and patient in room.  We discussed risk benefits and alternatives of proceeding with prolonged mechanical support. We also discussed the fact that we are unable to correct his multiple comorbidities and current wound care needs.  At this time the patient requests to be removed from the mechanical ventilator. And we discussed whether we would ever go back on the support. And the patient has declined.  This was very clear witnessed by the patient's sister as well as nursing staff in the room.  Patient will receive one-way extubation. We also discussed CODE STATUS. At this time the patient will become a DNR.  25 mins of advanced care [place], DO [facility] 2019/08/02 3:30 PM
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
## 2019-08-15 NOTE — Progress Notes (Signed)
Pt asystole on monitor. Polly Cobia, RN and [employer], RN verified no breath or heart sounds for two minutes. Death pronounced at 40.
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
## 2019-08-15 NOTE — Progress Notes (Signed)
Nutrition Follow-up  RD working remotely.   DOCUMENTATION CODES:   Not applicable  INTERVENTION:  - continue Pivot 1.5 @ 20 ml/hr . - will increase prostat from 60 ml TID to 60 ml QID. - will order Juven BID, each packet provides 95 calories, 2.5 grams of protein (collagen), and 9.8 grams of carbohydrate (3 grams sugar); also contains 7 grams of L-arginine and L-glutamine, 300 mg vitamin C, 15 mg vitamin E, 1.2 mcg vitamin B-12, 9.5 mg zinc, 200 mg calcium, and 1.5 g  Calcium Beta-hydroxy-Beta- methylbutyrate to support wound healing.  - TF regimen will provide 1710 kcal (71% estimated kcal need), 170 grams protein (100% estimated protein need), 163 grams carbohydrate.  - free water flush to continue to be per MD/NP.  * recommend replace NGT with small bore NGT.  * recommend 500 mg ascorbic acid BID and 220 mg zinc sulfate/day.     NUTRITION DIAGNOSIS:   Increased nutrient needs related to acute illness, wound healing, post-op healing as evidenced by estimated needs. -ongoing  GOAL:   Patient will meet greater than or equal to 90% of their needs -unmet with current TF regimen  MONITOR:   Vent status, TF tolerance, Labs, Weight trends, Skin  ASSESSMENT:   54 y.o. male with medical history significant of quadriplegia, suprapubic catheter, stage 4 sacral decubitus, obesity, chronic hypotension on midodrine, recurrent UTIs. Patient went to Urologist's office on 3/3 and had suprapubic catheter exchanged. After this he developed tachycardia and hypotension and was sent to the ED.  Significant Events: 3/3- admission 3/4- initial RD assessment 3/7- OR for excision and extensive debridement of lower back, R chest wall, R flank, R anterior abdominal wall; excision of L lower back 3/7- NGT placed in L nare 3/9- return to OR 3/10- TF started at 20 ml/hr 3/13- extubation 3/14- re-intubated   Patient remains intubated with NGT in place. He is receiving Pivot 1.5 2 20  ml/hr with  60 ml prostat TID and 400 ml free water every 2 hours. This regimen is providing 1320 kcal (55% estimated kcal need), 135 grams protein (82% estimated protein need), 143 grams carbohydrate, and 2764 ml free water.   Weight was stable 3/16-3/17 and he has not yet been weighed today. Flow sheet documentation indicates mild pitting edema to RUE, moderate pitting edema to LUE, and deep pitting edema to BLE.   Ongoing discussions have been had with patient and his sister concerning [place] and medical course. Most recently, Palliative Care was able to speak with his sister, [name], and patient (who notes state was more alert and communicative yesterday). Patient has requested ongoing Full Code with aggressive measures. He is aware of poor prognosis.   Per notes: - patient was refusing body repositioning and oral care during night shift - EKG during night shift due to patient reporting chest pain--unremarkable - SBTs as tolerated, poor respiratory muscle strength - possible refeeding syndrome--K, Mg, folate, and thiamine ordered - pleural effusions--thought to be 2/2 malnutrition and hypoalbuminemia   Patient is currently intubated on ventilator support MV: 7.1 L/min Temp (24hrs), Avg:98 F (36.7 C), Min:97.6 F (36.4 C), Max:98.6 F (37 C) Propofol: none    Labs reviewed; CBG: 76 mg/dl, K: 2.9 mmol/l, BUN: 36 mg/dl, creatinine: 0.35 mg/dl, Ca: 5.9 mg/dl, Mg: 0.9 mg/dl. Medications reviewed; 300 mg ferrous sulfate/day, 1 mg folvite/day, sliding scale novolog, 6 g IV Mg sulfate x1 run today at 0630, 15 ml multivitamin/day, 40 mg IV protonix/day, 40 mEq Klor-Con x1 dose 3/17 at 1000,  40 mEq KCl per NGT at 0600 and 1000 today, 100 mg thiamine/day.  Drip; precedex @ 0.5 mcg/kg/hr.    Diet Order:   Diet Order            Diet NPO time specified  Diet effective midnight              EDUCATION NEEDS:   No education needs have been identified at this time  Skin:  Skin Assessment: Skin  Integrity Issues: Other: Left trochanter: 5cm x 4cm (100% slough moist); Right flank: 3cm x 3cm Unstageable (100% nonviable); Right lateral chest: 9cm x 7cm x 0.1cm; Right thoracic area: 4cm x 3cm x 0.1cm (100% necrotic); Right thoracic area distal: 5cm x 2cm x 0.2cm (50% nonviable/50% pale non-granual); Right abdominal area: 3cmx 4cm x 3cm (80% pale, non granular/20% yellow/green); Sacrum/buttocks/ITs: 26cm x 30cm x 0.4cm (75% red, 25% nonviable; area will not heal at this point); Scattered partial thickness ulcers over the right hip: pale with fibrinous material, dry; Left medial heel: 3cm x 3cm x 0cm (100% hard stable eschar)  Last BM:  3/17 (375 ml via colostomy)  Height:   Ht Readings from Last 1 Encounters:  07/18/19 6' (1.829 m)    Weight:   Wt Readings from Last 1 Encounters:  07/31/19 82 kg    Ideal Body Weight:  80.9 kg  BMI:  Body mass index is 24.52 kg/m.  Estimated Nutritional Needs:   Kcal:  2400-2600 kcal  Protein:  164-199 grams (2.3-2.8 grams/kg)  Fluid:  >/= 2.5 L/day     [name], MS, RD, LDN, CNSC Inpatient Clinical Dietitian RD pager # available in [facility]  After hours/weekend pager # available in [facility]
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
## 2019-08-15 DEATH — deceased

## 2019-08-23 ENCOUNTER — Encounter (HOSPITAL_BASED_OUTPATIENT_CLINIC_OR_DEPARTMENT_OTHER): Payer: Medicare Other | Admitting: Internal Medicine

## 2019-09-20 IMAGING — CT CT ABD-PELV W/ CM
2 of 6 series · 16 of 46 positions shown, 18 images · IV contrast (ISOVUE)
Comparison: 08/27/2016

CLINICAL DATA: Vomiting and black tarry stool since [REDACTED].
Quadriplegic.

EXAM:
CT ABDOMEN AND PELVIS WITH CONTRAST
TECHNIQUE: Multidetector CT imaging of the abdomen and pelvis was performed
using the standard protocol following bolus administration of
intravenous contrast.
CONTRAST:  <See Chart> BAM2WA-500 IOPAMIDOL (BAM2WA-500) INJECTION
61%

[Series 2: abd/pel with · axial · 0.93mm/px · z∈[+356,+776]mm · 13 of 98 slices shown, 15 images]
[im 7/98  soft-tissue]
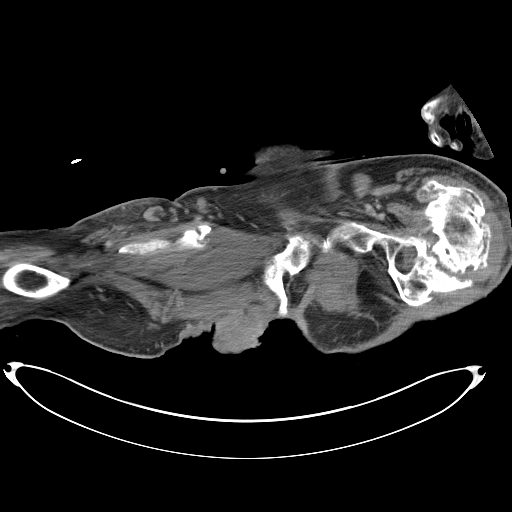
[im 7/98  bone]
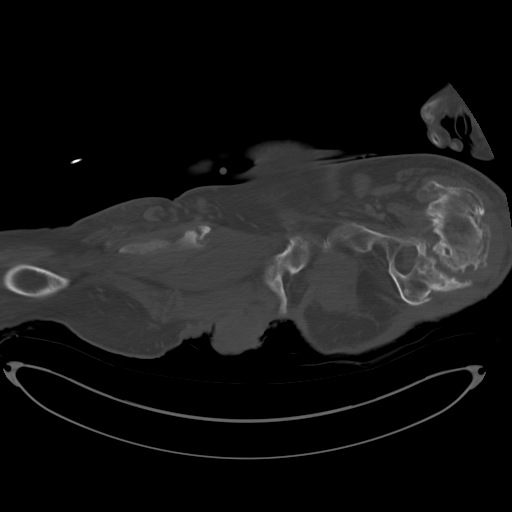
[im 13/98  soft-tissue]
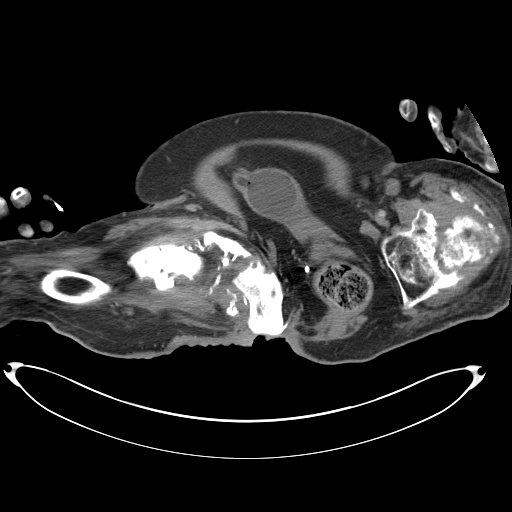
[im 19/98  soft-tissue]
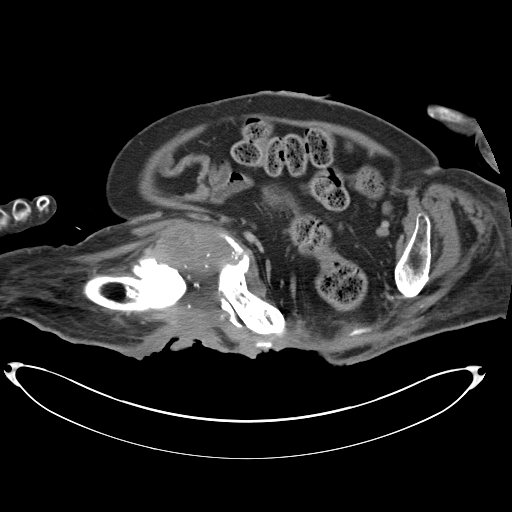
[im 31/98  soft-tissue]
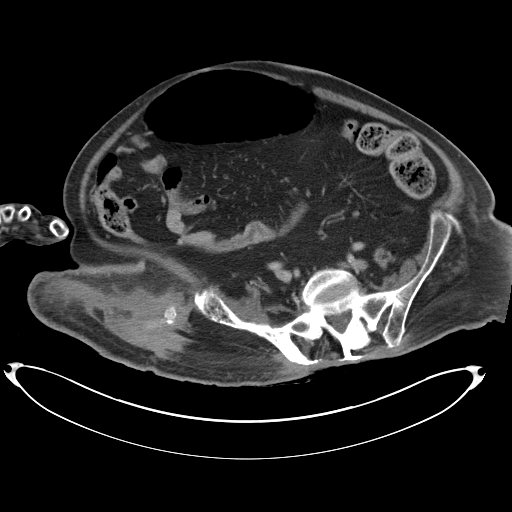
[im 37/98  soft-tissue]
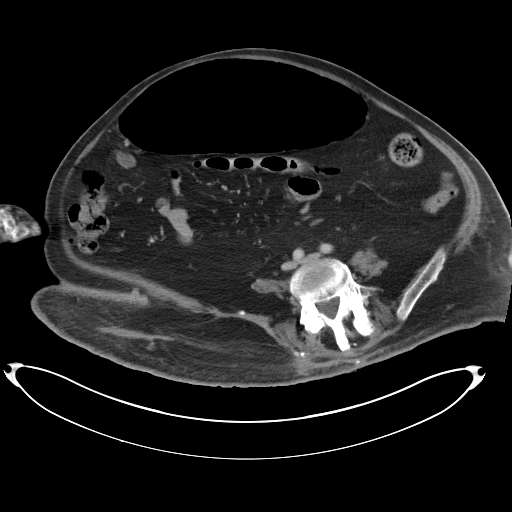
[im 43/98  soft-tissue]
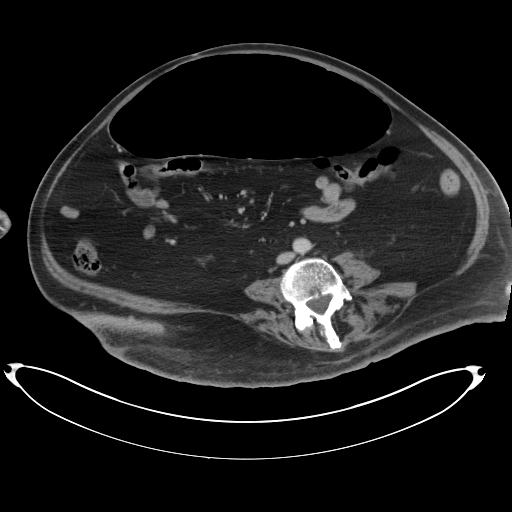
[im 49/98  soft-tissue]
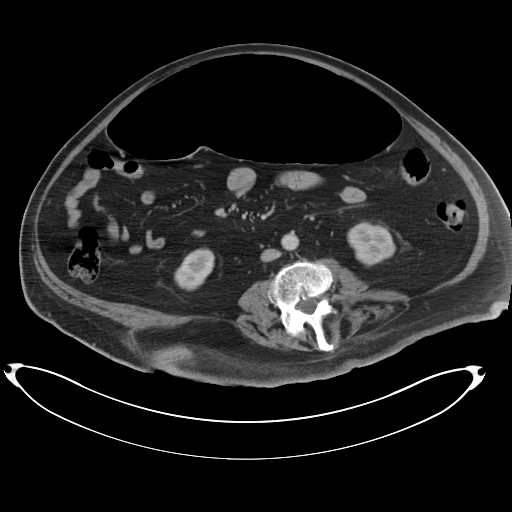
[im 55/98  soft-tissue]
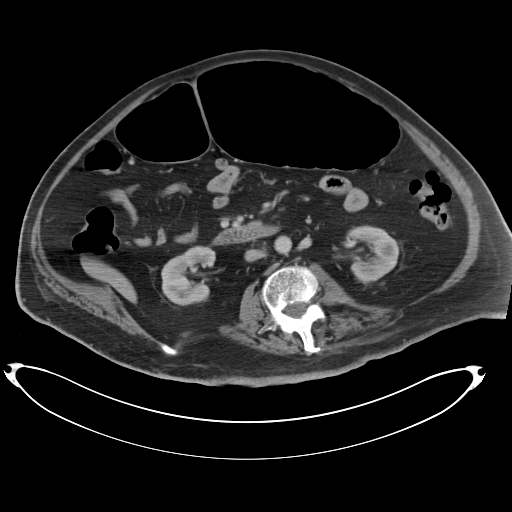
[im 61/98  soft-tissue]
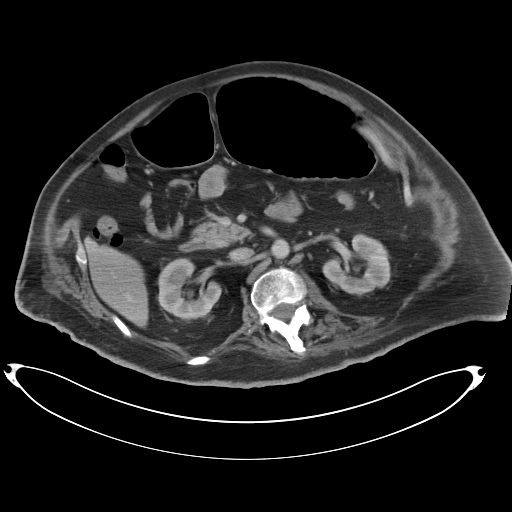
[im 61/98  bone]
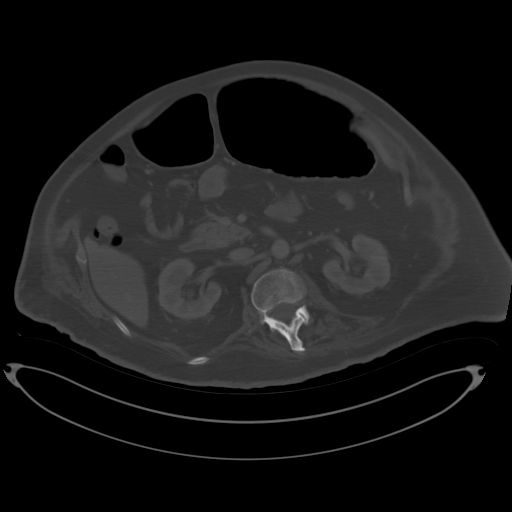
[im 67/98  soft-tissue]
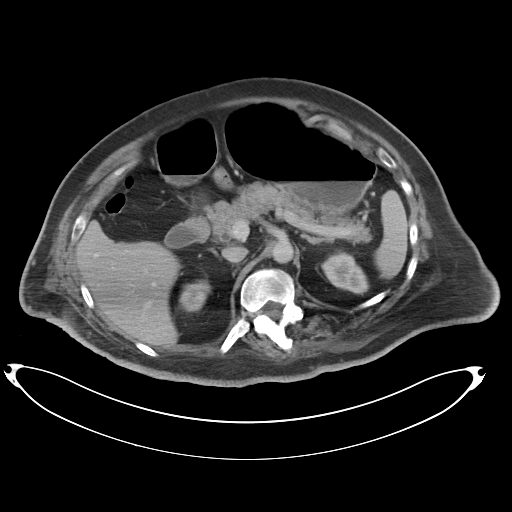
[im 79/98  soft-tissue]
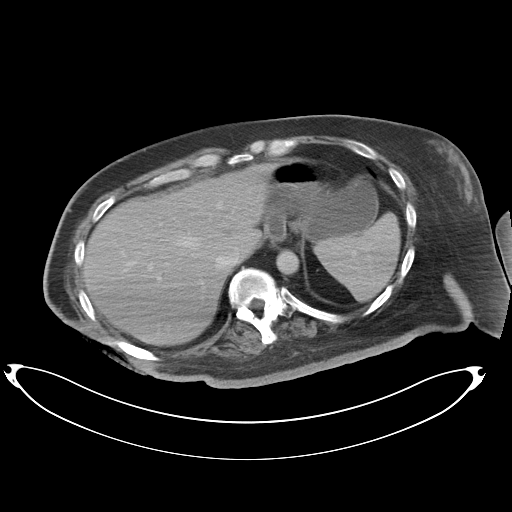
[im 85/98  soft-tissue]
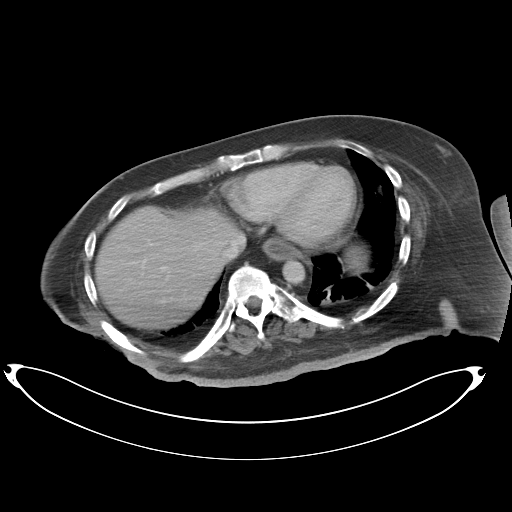
[im 91/98  soft-tissue]
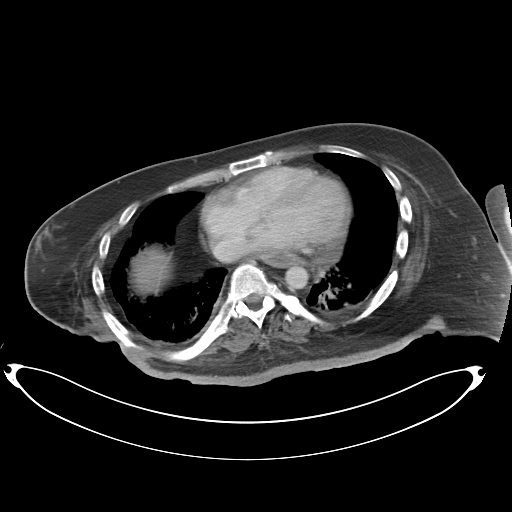

[Series 5: coronal a/|p · coronal · 0.93mm/px · 3 of 186 slices shown]
[im 62/186  soft-tissue]
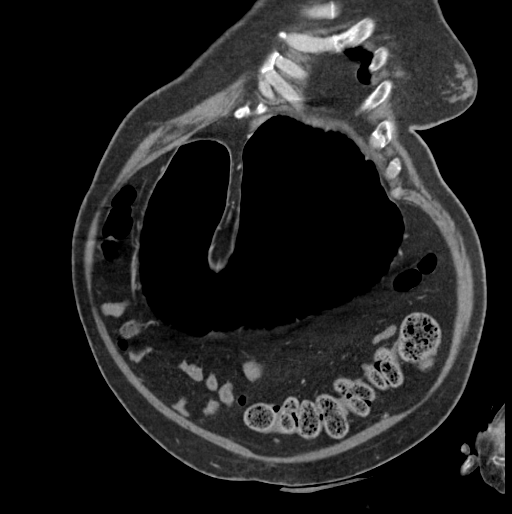
[im 83/186  soft-tissue]
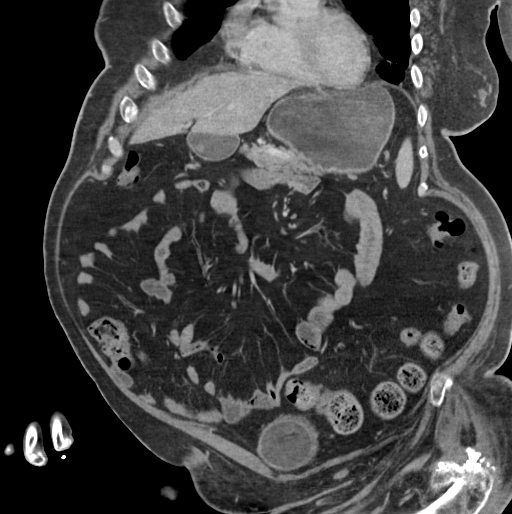
[im 103/186  soft-tissue]
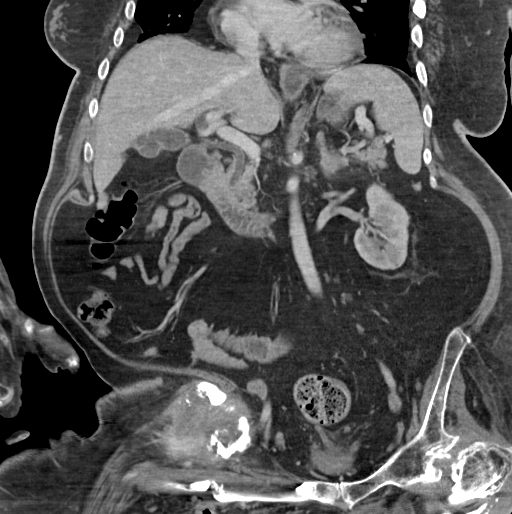

[16 of 46 positions shown; findings below may reference images not displayed]

FINDINGS: Lower chest: Lung bases demonstrate mild bibasilar
atelectasis/scarring. Subtle right base opacification which may be
due to atelectasis although cannot exclude early infection. Tiny
amount of right pleural fluid fluid within the distal esophagus
likely due to reflux.

Hepatobiliary: Within normal.

Pancreas: Within normal.

Spleen: Within normal.

Adrenals/Urinary Tract: Adrenal glands are normal. Kidneys normal in
size without hydronephrosis or nephrolithiasis. 1 cm hypodensity
over the mid pole cortex of the left kidney unchanged likely a cyst.
Ureters are normal. Suprapubic Foley catheter is present within a
decompressed bladder.

Stomach/Bowel: Stable gastric distension. Small bowel is within
normal. Appendix is normal. Left lower quadrant ostomy site within
normal and unchanged. Stool within the distal colon unchanged.

Vascular/Lymphatic: Within normal.

Reproductive: Within normal.

Other: No free fluid, free air or focal inflammatory change.

Musculoskeletal: Severe chronic changes of the pelvis and hips
without significant change. Evidence of patient's right-sided soft
tissue ulceration posterior to the right hip without significant
change. Cannot exclude soft tissue infection or associated adjacent
osteomyelitis.
IMPRESSION: No acute findings in the abdomen/pelvis.

Bibasilar scarring/ atelectasis with tiny amount of right pleural
fluid. Subtle right base opacification as could not completely
exclude an early infectious process in the right base.

Stable left renal cyst.

Ostomy site over the left lower quadrant unchanged. Suprapubic Foley
catheter within a decompressed bladder.

Fluid within the distal esophagus likely due to reflux. Mild stable
gastric distension.

Severe chronic changes over the pelvic bones and hips with stable
soft tissue ulceration over the posterior aspect of the right hip.
Cannot exclude soft tissue infection or bone infection.

## 2019-10-13 IMAGING — DX DG CHEST 1V PORT
2 series · 2 of 2 positions shown · non-contrast
Comparison: November 27, 2016

CLINICAL DATA: Fever

EXAM:
PORTABLE CHEST 1 VIEW

[chest ap (1 of 2)]
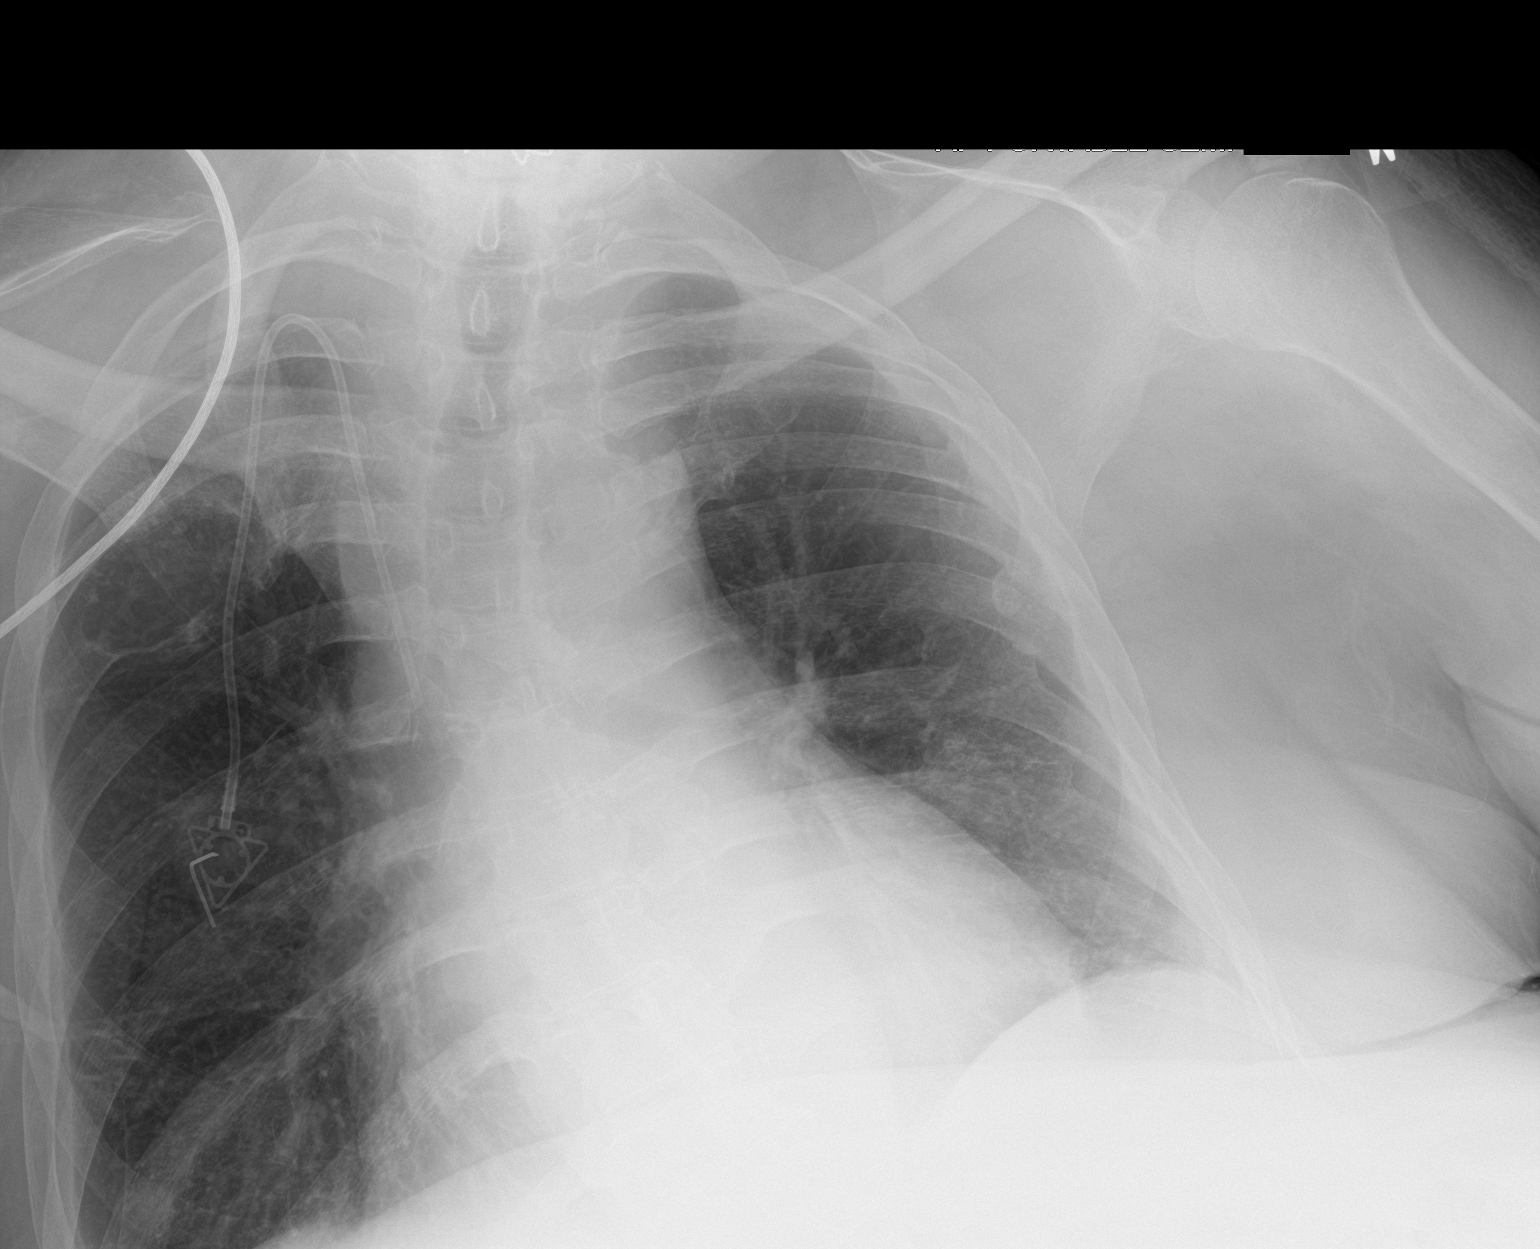

[chest ap (2 of 2)]
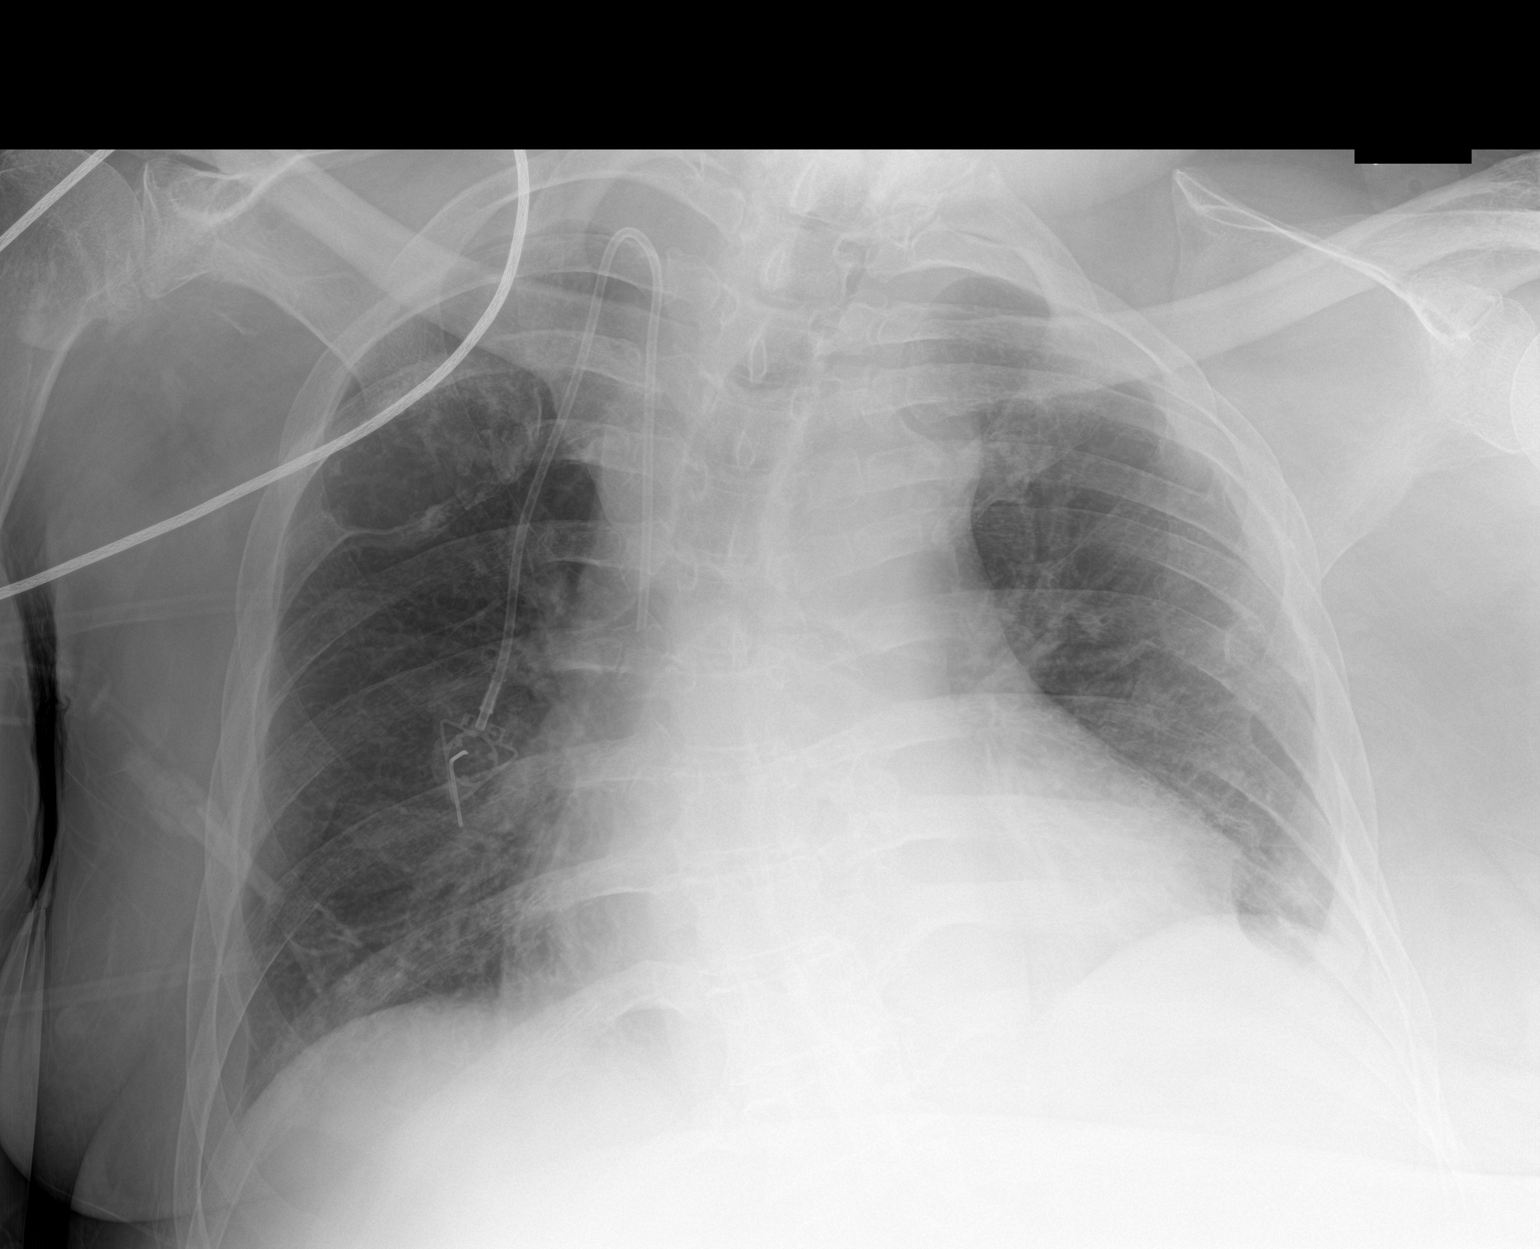

[2 of 2 positions shown; findings below may reference images not displayed]

FINDINGS: Port-A-Cath tip is in superior vena cava. No pneumothorax. There is
chronic right upper lobe volume loss. Lungs elsewhere clear. Heart
is mildly enlarged with pulmonary vascularity within normal limits.
No adenopathy.

Old healed rib fractures are noted on the left. There is an
expansile lesion involving the right fifth rib with chondroid
matrix. There is postoperative change in the lower cervical spine.
IMPRESSION: 1. Chronic volume loss right upper lobe. Etiology uncertain. This
finding may warrant contrast enhanced chest CT to further assess.

2.  Lungs elsewhere clear.

3.  Stable cardiac prominence.

4. Expansile right fifth rib lesion with chondroid matrix, stable.
Main differential considerations for a lesion of this nature include
enchondroma or low grade chondrosarcoma.

5.  Port-A-Cath tip in superior vena cava.  No evident pneumothorax.

## 2019-12-02 IMAGING — DX DG CHEST 2V
2 series · 2 of 2 positions shown · non-contrast
Comparison: 02/28/2017 chest radiograph

CLINICAL DATA: 51 y/o M; cough and congestion. Possible urinary
tract infection.

EXAM:
CHEST  2 VIEW

[chest lat]
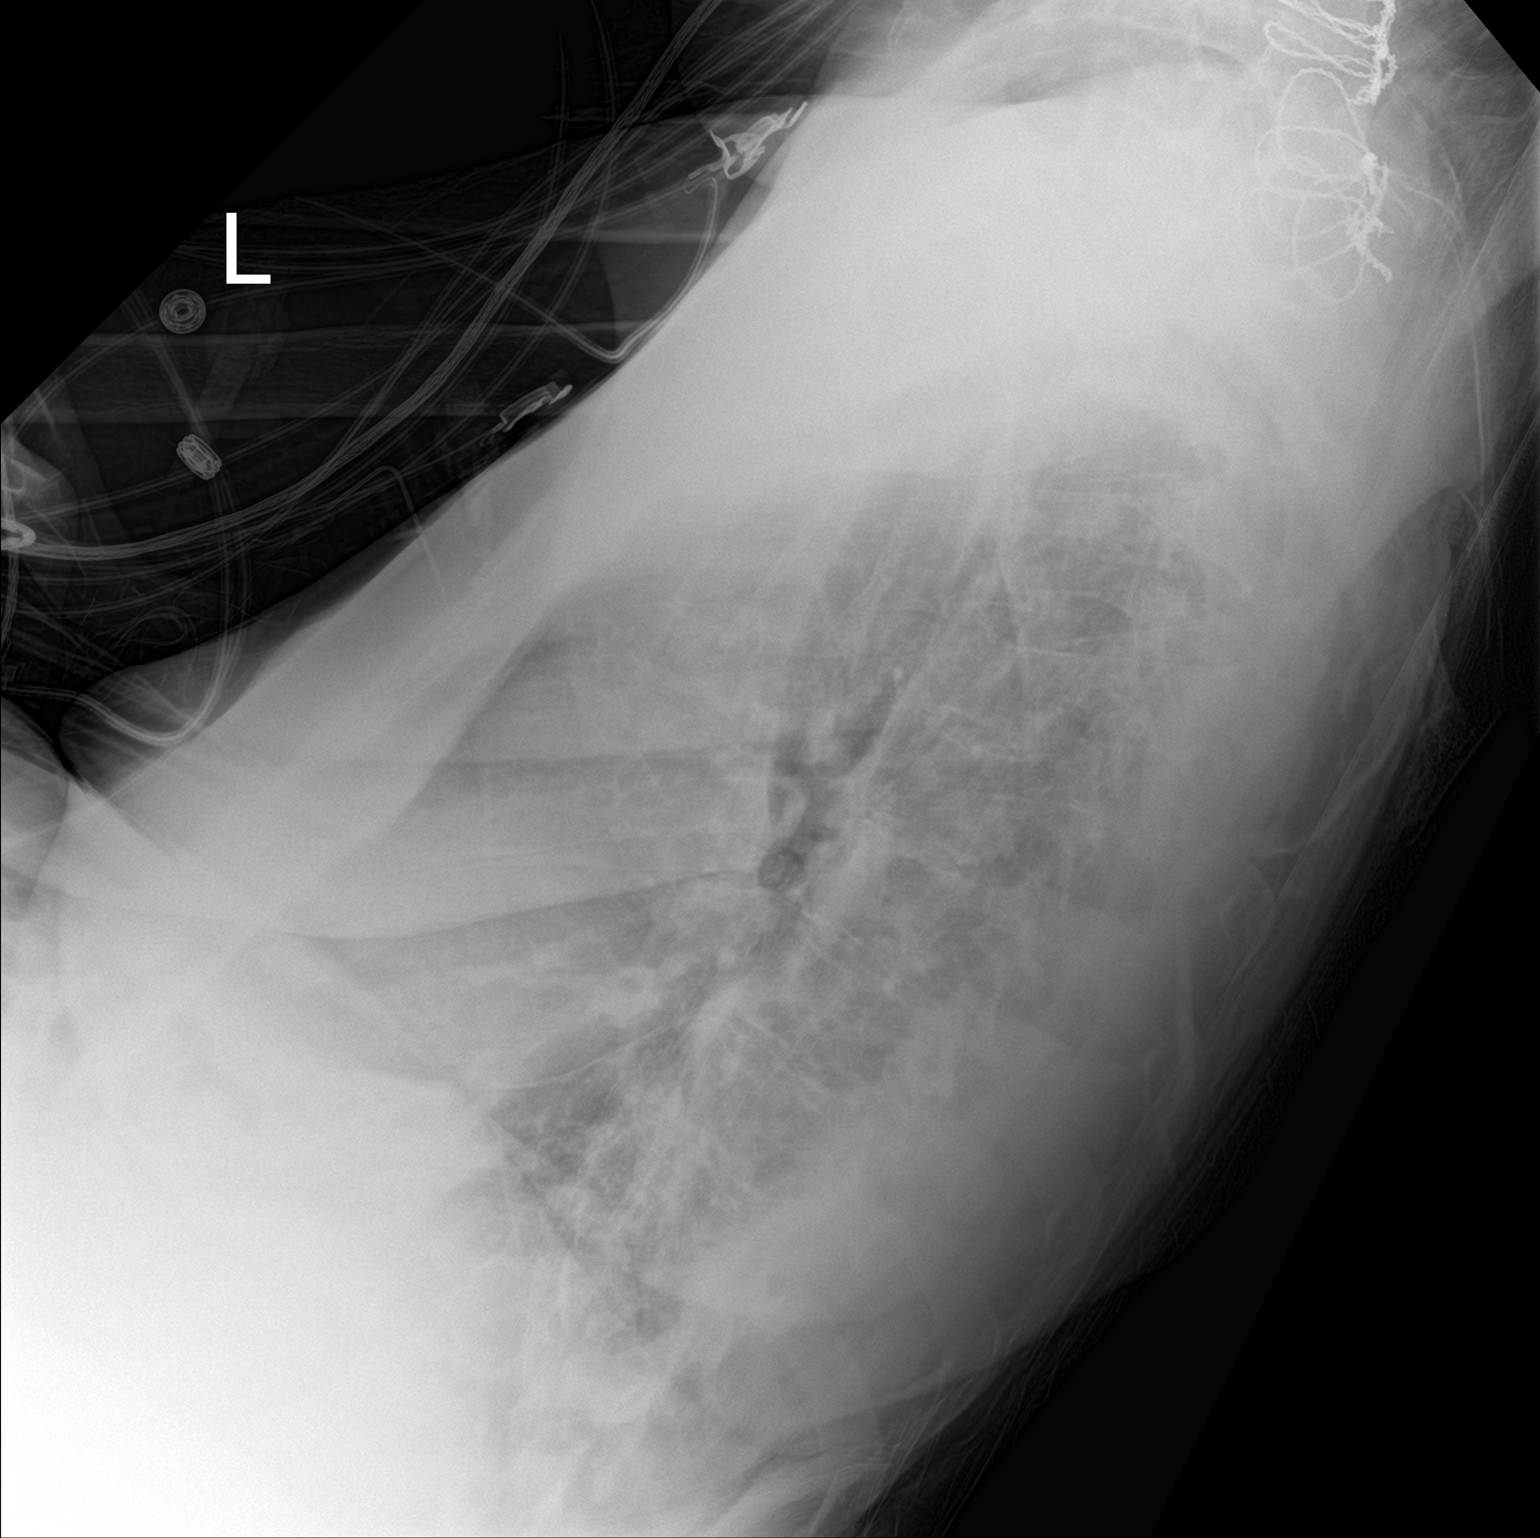

[chest ap]
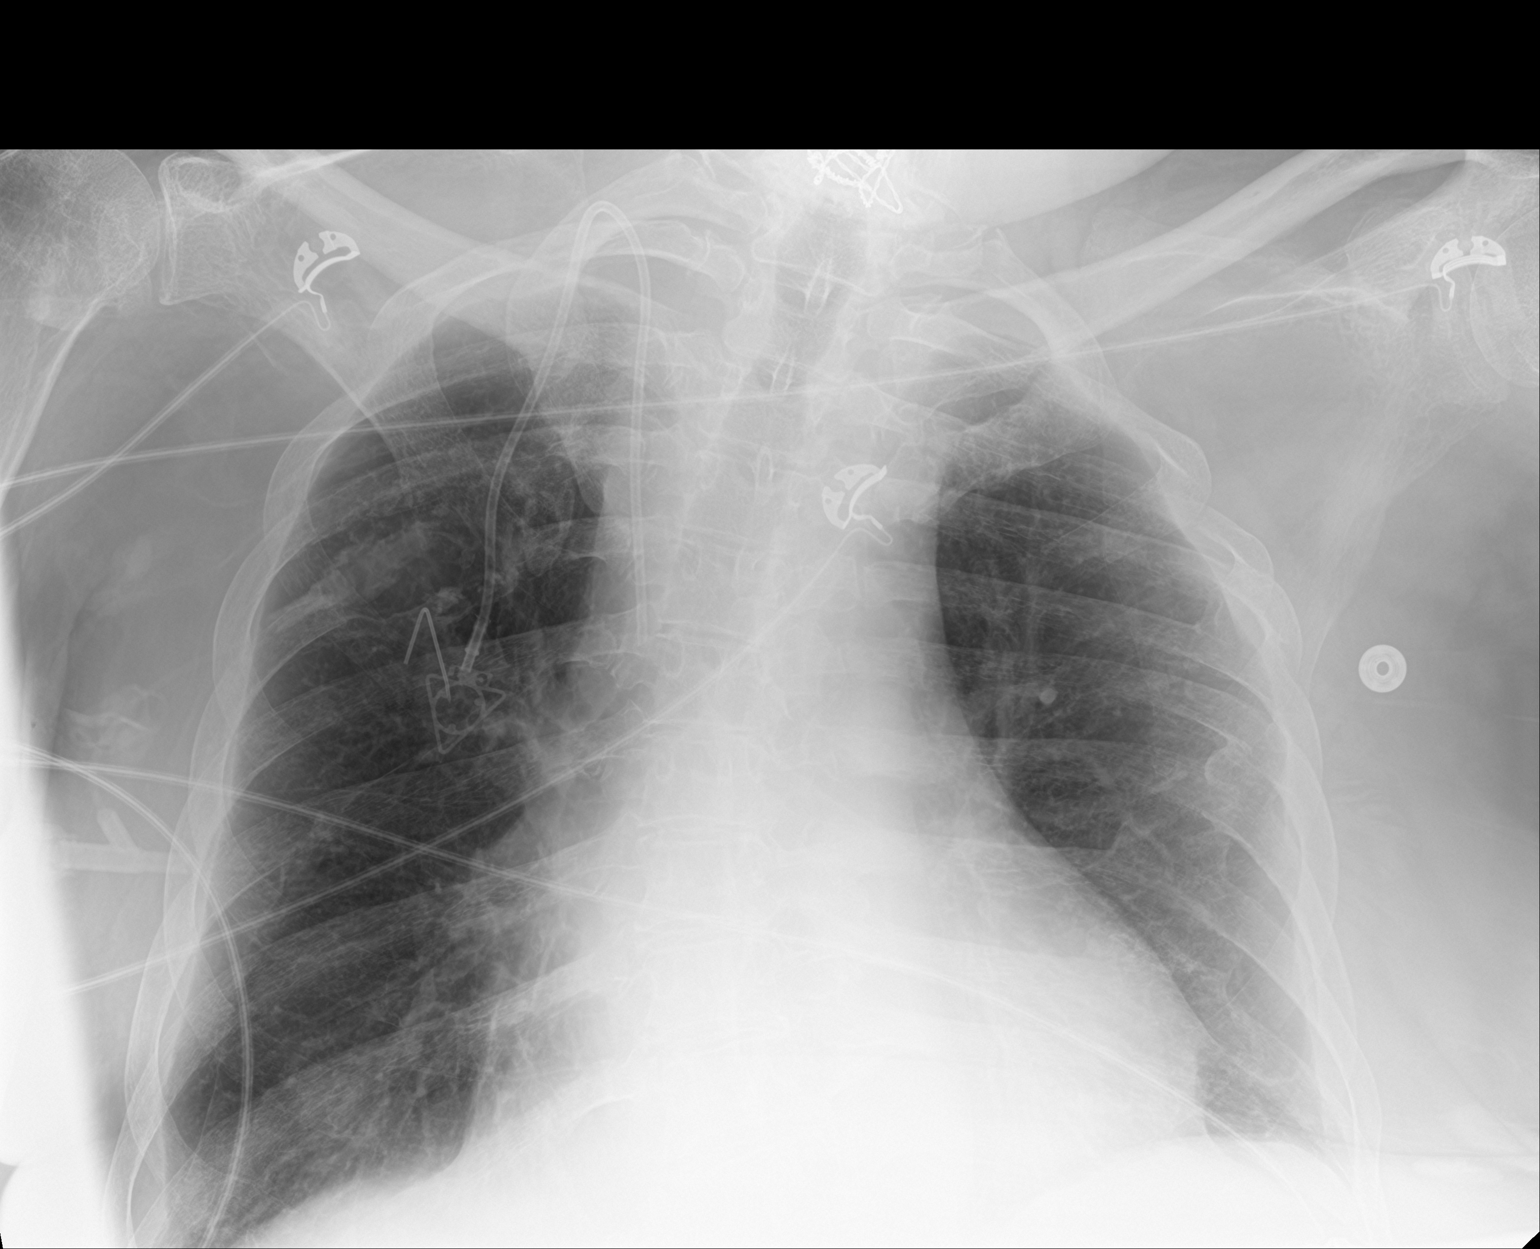

[2 of 2 positions shown; findings below may reference images not displayed]

FINDINGS: Stable normal cardiac silhouette given projection and technique.
Right central venous catheter tip projects over upper SVC. No focal
consolidation. No acute osseous abnormality identified.
IMPRESSION: No acute pulmonary process identified.

By: Deeqa Rayaan Adlaho M.D.

## 2019-12-17 IMAGING — CT CT RENAL STONE PROTOCOL
2 of 4 series · 16 of 46 positions shown, 18 images · non-contrast
Comparison: 05/04/2017 ultrasound, 02/05/2017 CT and prior studies

CLINICAL DATA: 51-year-old male with acute abdominal pain and
nausea and vomiting.

EXAM:
CT ABDOMEN AND PELVIS WITHOUT CONTRAST
TECHNIQUE: Multidetector CT imaging of the abdomen and pelvis was performed
following the standard protocol without IV contrast.

[Series 3: ap without · axial · non-contrast · 0.97mm/px · z∈[+685,+1150]mm · 13 of 105 slices shown, 15 images]
[im 6/105  soft-tissue]
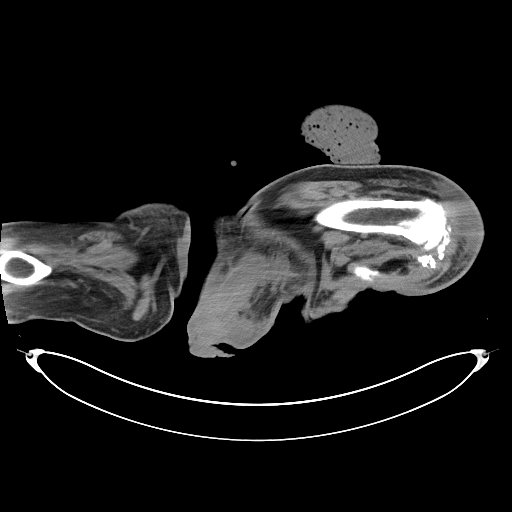
[im 6/105  bone]
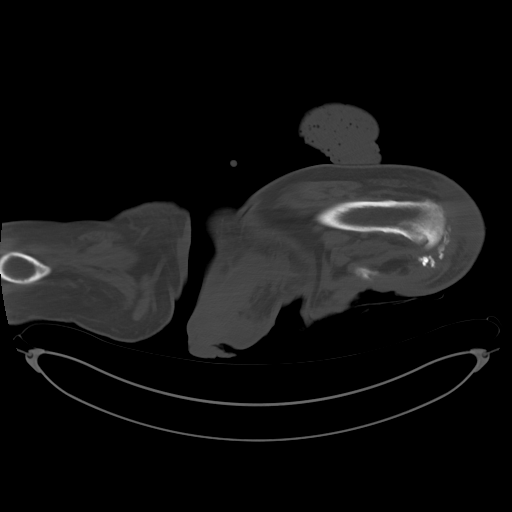
[im 17/105  soft-tissue]
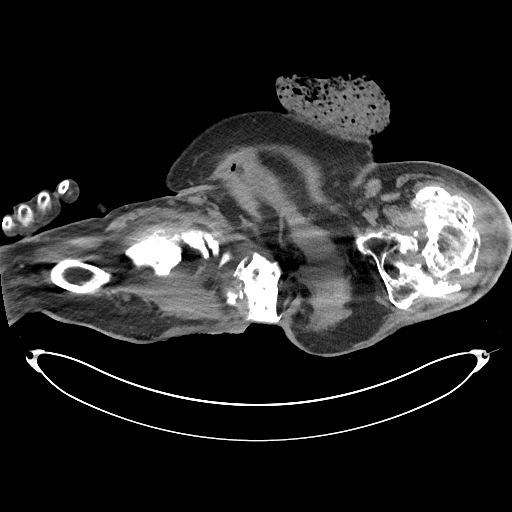
[im 22/105  soft-tissue]
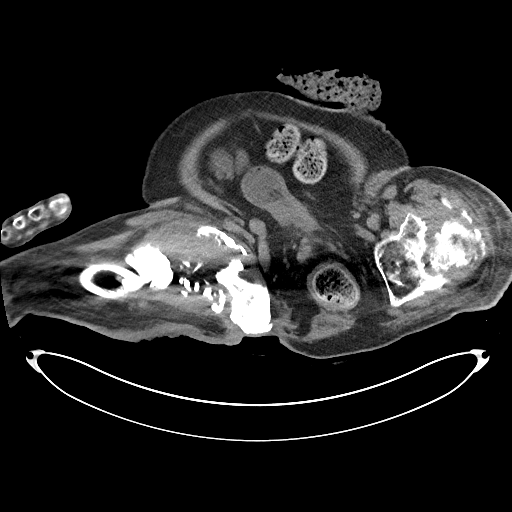
[im 28/105  soft-tissue]
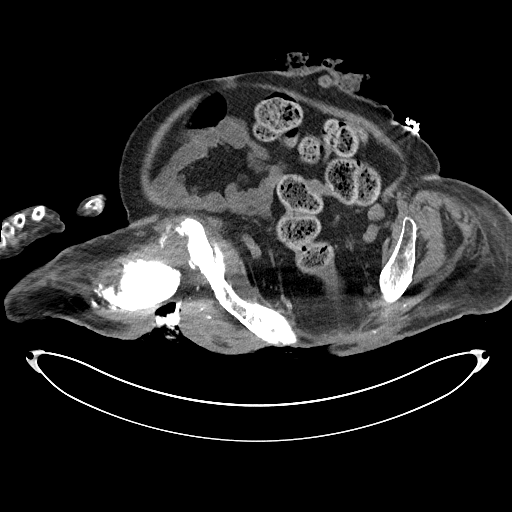
[im 39/105  soft-tissue]
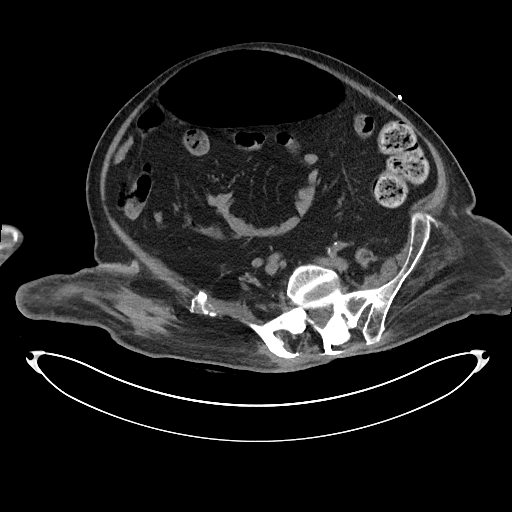
[im 44/105  soft-tissue]
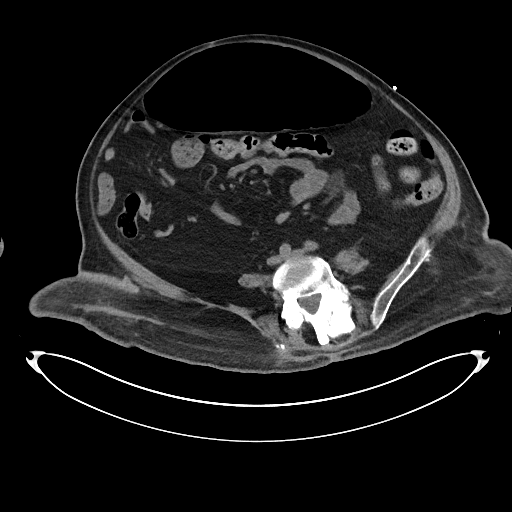
[im 55/105  soft-tissue]
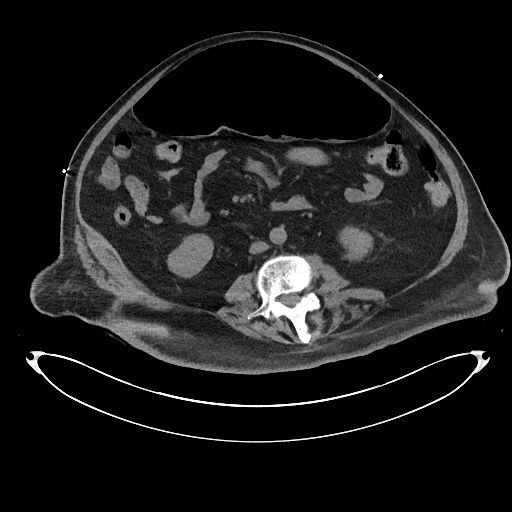
[im 61/105  soft-tissue]
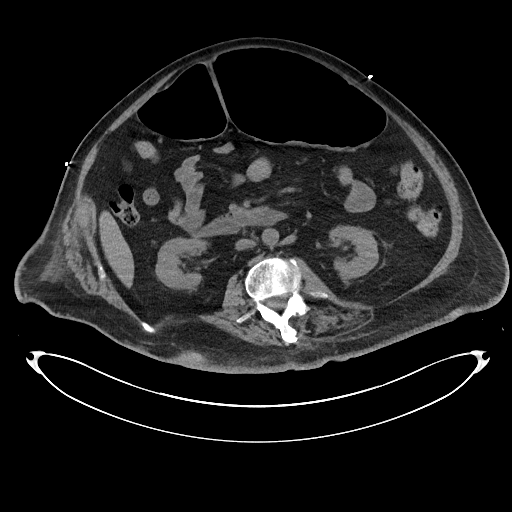
[im 66/105  soft-tissue]
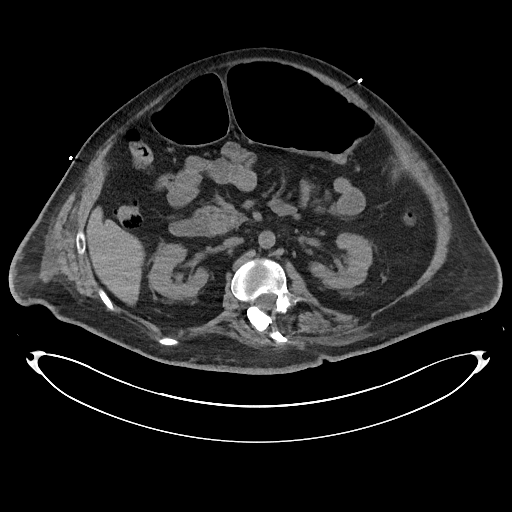
[im 66/105  bone]
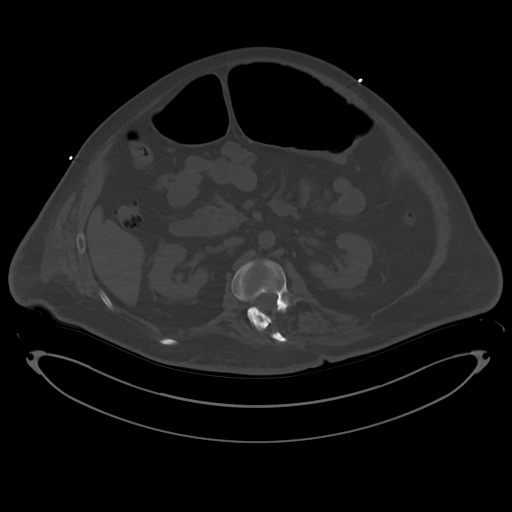
[im 77/105  soft-tissue]
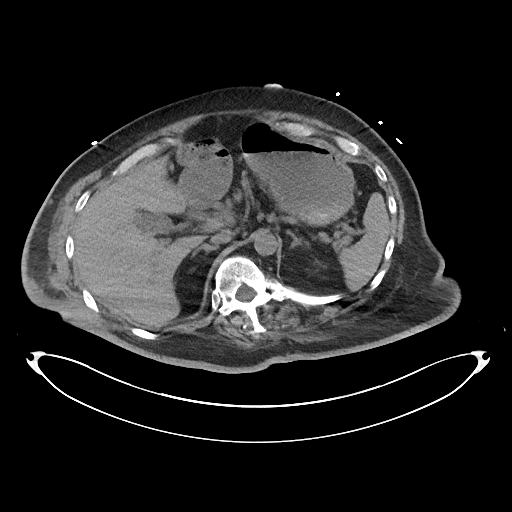
[im 83/105  soft-tissue]
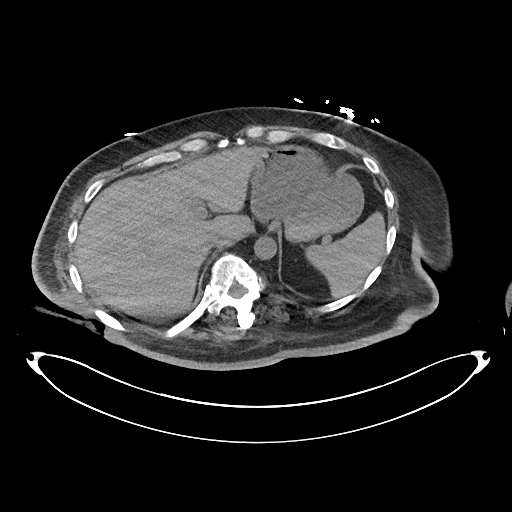
[im 88/105  soft-tissue]
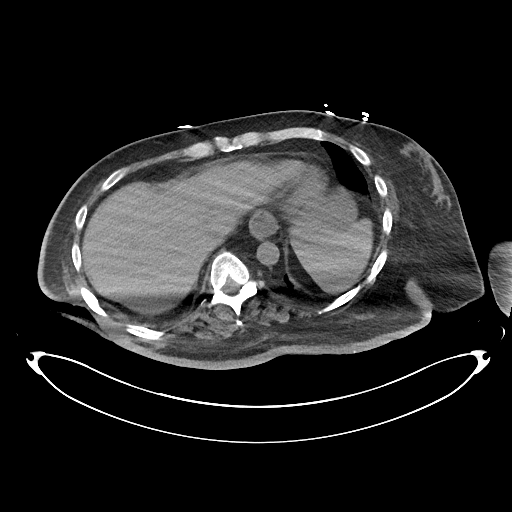
[im 99/105  soft-tissue]
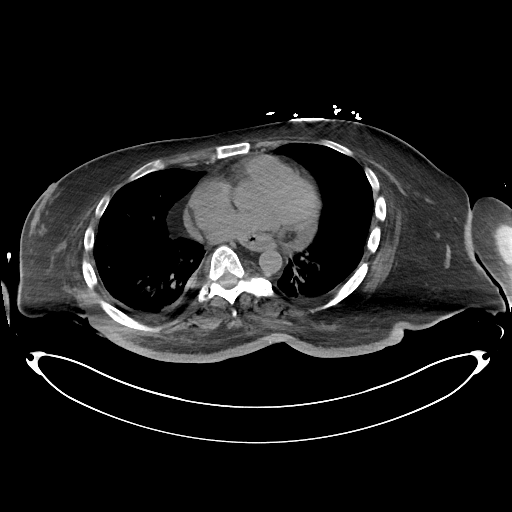

[Series 6: cor · coronal · 0.84mm/px · 3 of 107 slices shown]
[im 36/107  soft-tissue]
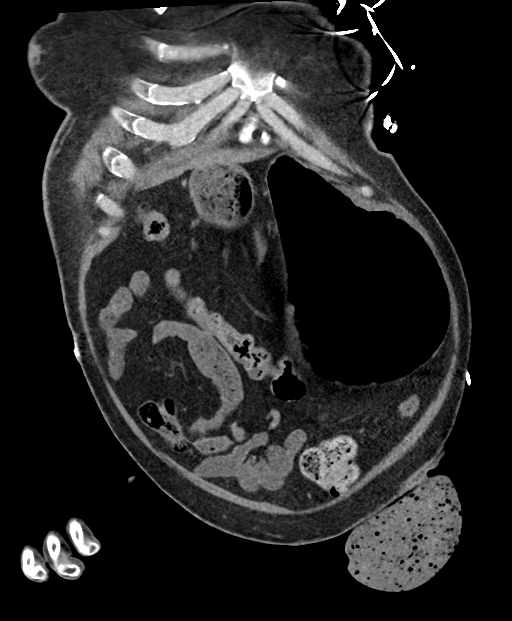
[im 48/107  soft-tissue]
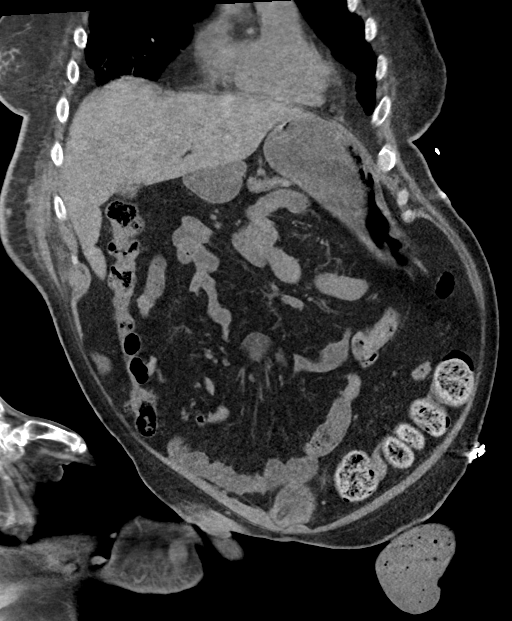
[im 59/107  soft-tissue]
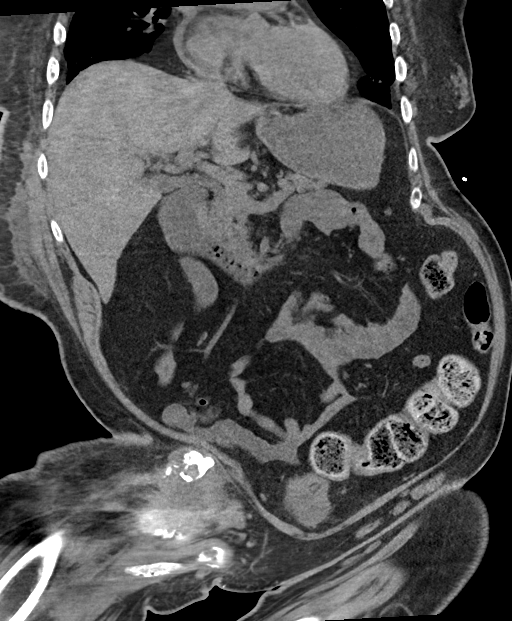

[16 of 46 positions shown; findings below may reference images not displayed]

FINDINGS: Please note that parenchymal abnormalities may be missed without
intravenous contrast.

Lower chest: Mild bibasilar atelectasis/scarring again noted. Mild
cardiomegaly identified.

Hepatobiliary: The liver and gallbladder are unremarkable. No
definite biliary dilatation.

Pancreas: Unremarkable

Spleen: Unremarkable

Adrenals/Urinary Tract: The kidneys are unchanged without acute
abnormality. No evidence of hydronephrosis or urinary calculi. A
suprapubic catheter within the bladder is again noted. The adrenal
glands are unremarkable.

Stomach/Bowel: Gastric distension is unchanged. There is no evidence
of small bowel obstruction. No definite bowel wall thickening or
inflammation noted.

Vascular/Lymphatic: No significant vascular findings are present. No
enlarged abdominal or pelvic lymph nodes.

Reproductive: Unremarkable

Other: No ascites, pneumoperitoneum or focal collection.

Musculoskeletal: Severe chronic changes of the pelvis and hips again
noted and unchanged. Right pelvic/hip ulceration and soft tissue is
also unchanged. No acute bony abnormalities are identified.
IMPRESSION: 1. No evidence of acute abnormality
2. Unchanged chronic pelvic and hip changes. Chronic
osteomyelitis/infection is not excluded.
3. Mild bibasilar atelectasis/scarring.

## 2020-03-01 IMAGING — DX DG CHEST 1V PORT
2 series · 2 of 2 positions shown · non-contrast
Comparison: 05/04/2017

CLINICAL DATA: Weakness fever and chills

EXAM:
PORTABLE CHEST 1 VIEW

[chest ap (1 of 2)]
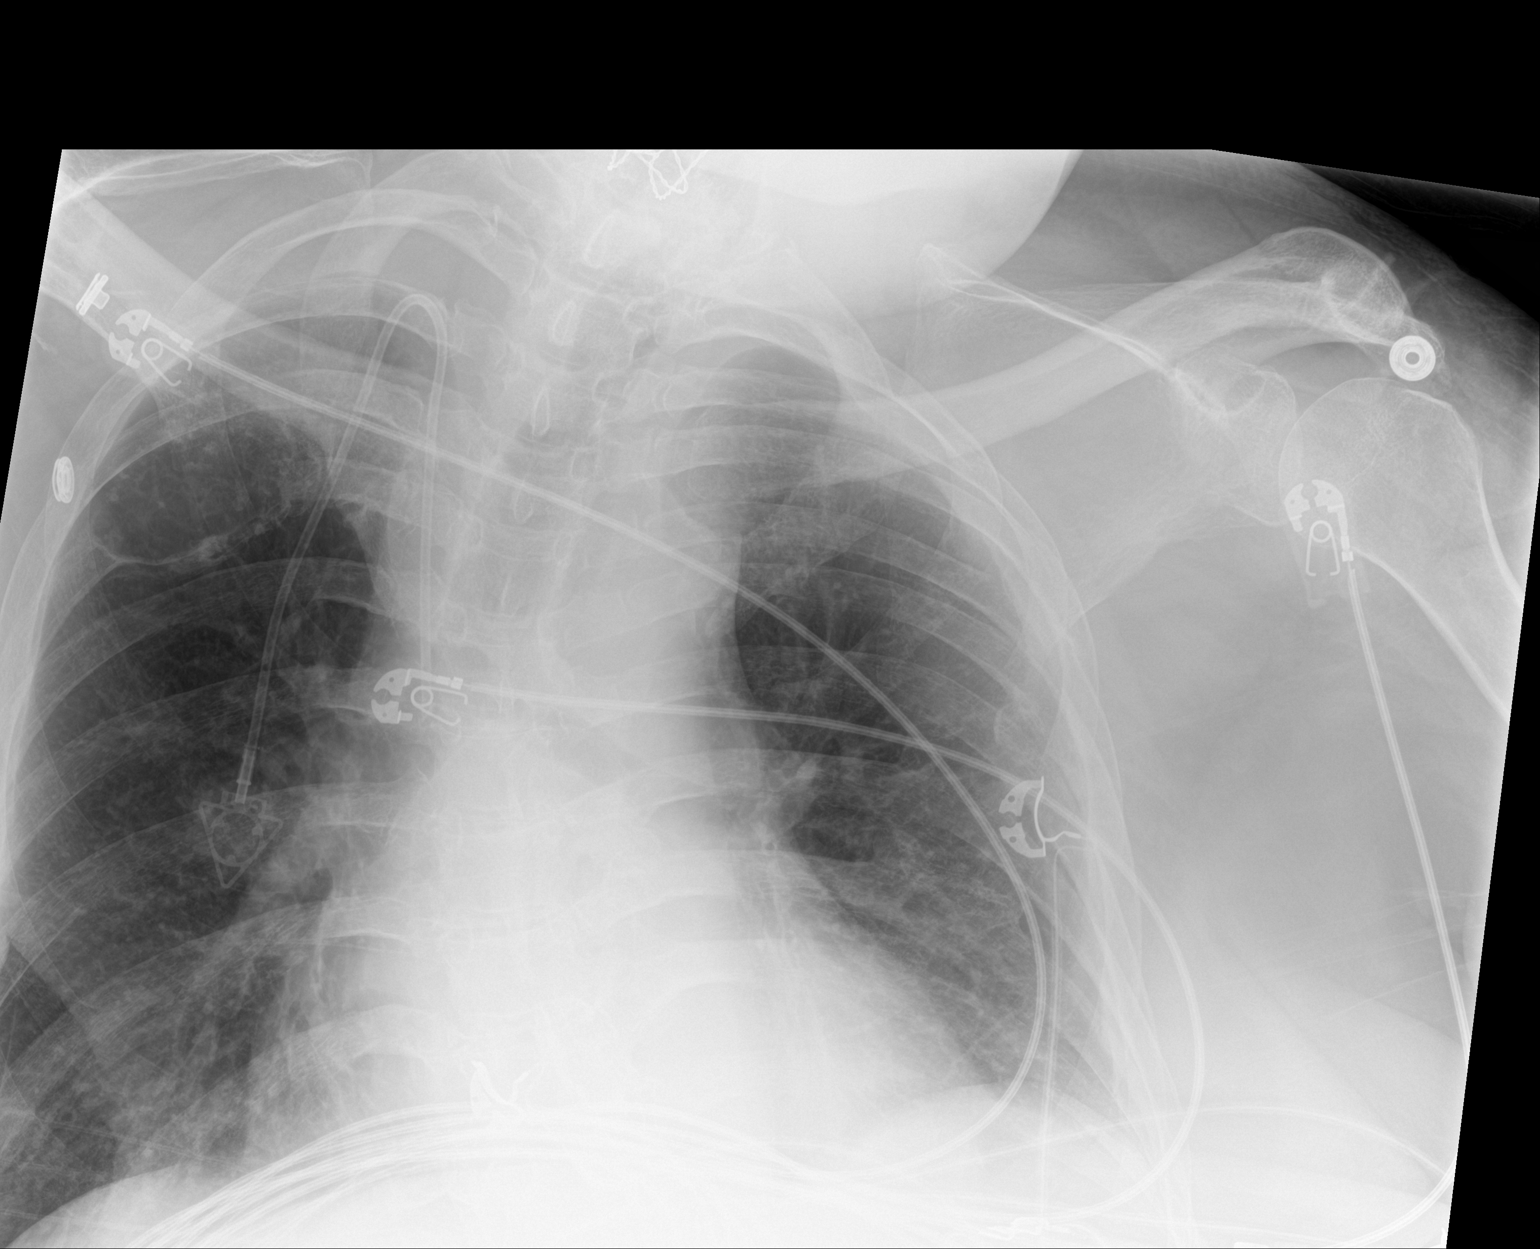

[chest ap (2 of 2)]
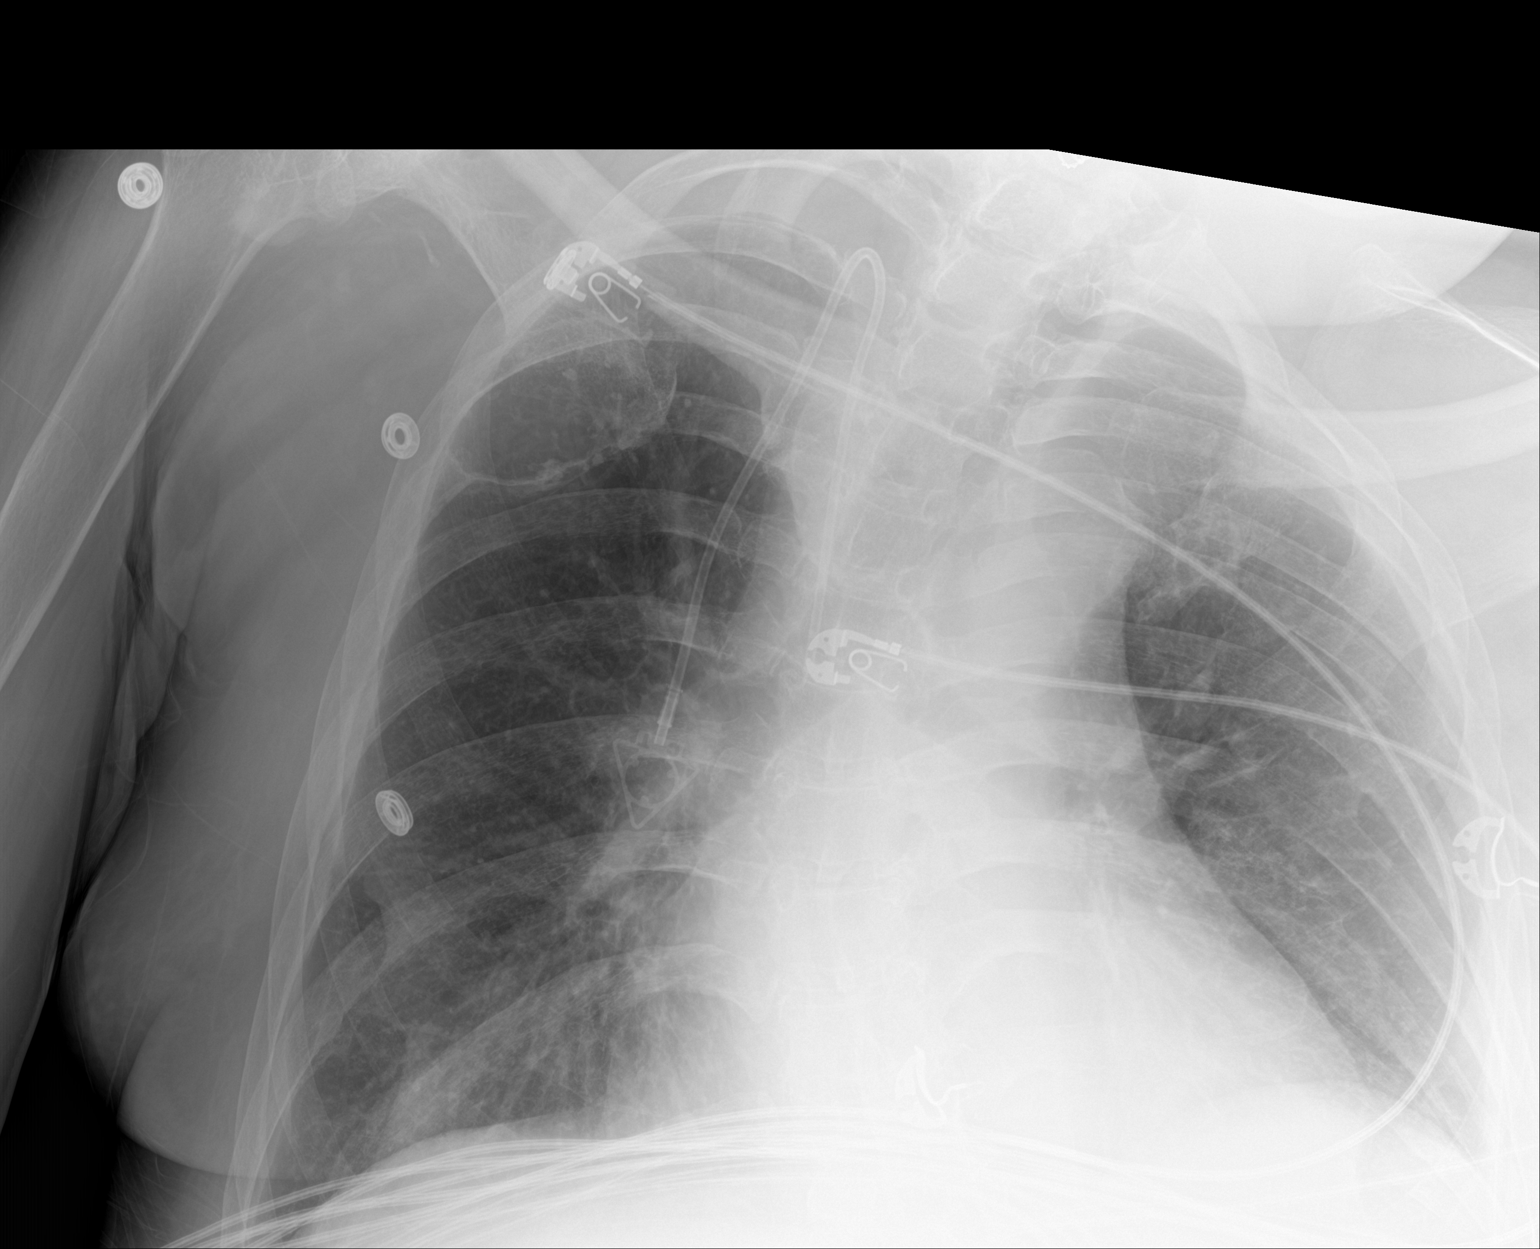

[2 of 2 positions shown; findings below may reference images not displayed]

FINDINGS: Right-sided central venous port tip over the SVC. Hazy bibasilar
opacity. Enlarged cardiomediastinal silhouette. No pneumothorax.
Expansile lytic right fifth rib lesion again noted. Left 6 and
seventh rib anomaly again noted.
IMPRESSION: 1. Hazy bibasilar airspace disease may reflect atelectasis or
pneumonia
2. Mild cardiomegaly
3. Right fifth rib expansile and lytic lesion again noted.

## 2020-03-02 IMAGING — DX DG ABDOMEN 1V
2 series · 2 of 2 positions shown · non-contrast
Comparison: CT 05/04/2017, 08/27/2016, radiograph 02/02/2016

CLINICAL DATA: Abdomen distension

EXAM:
ABDOMEN - 1 VIEW

[abdomen kub (1 of 2)]
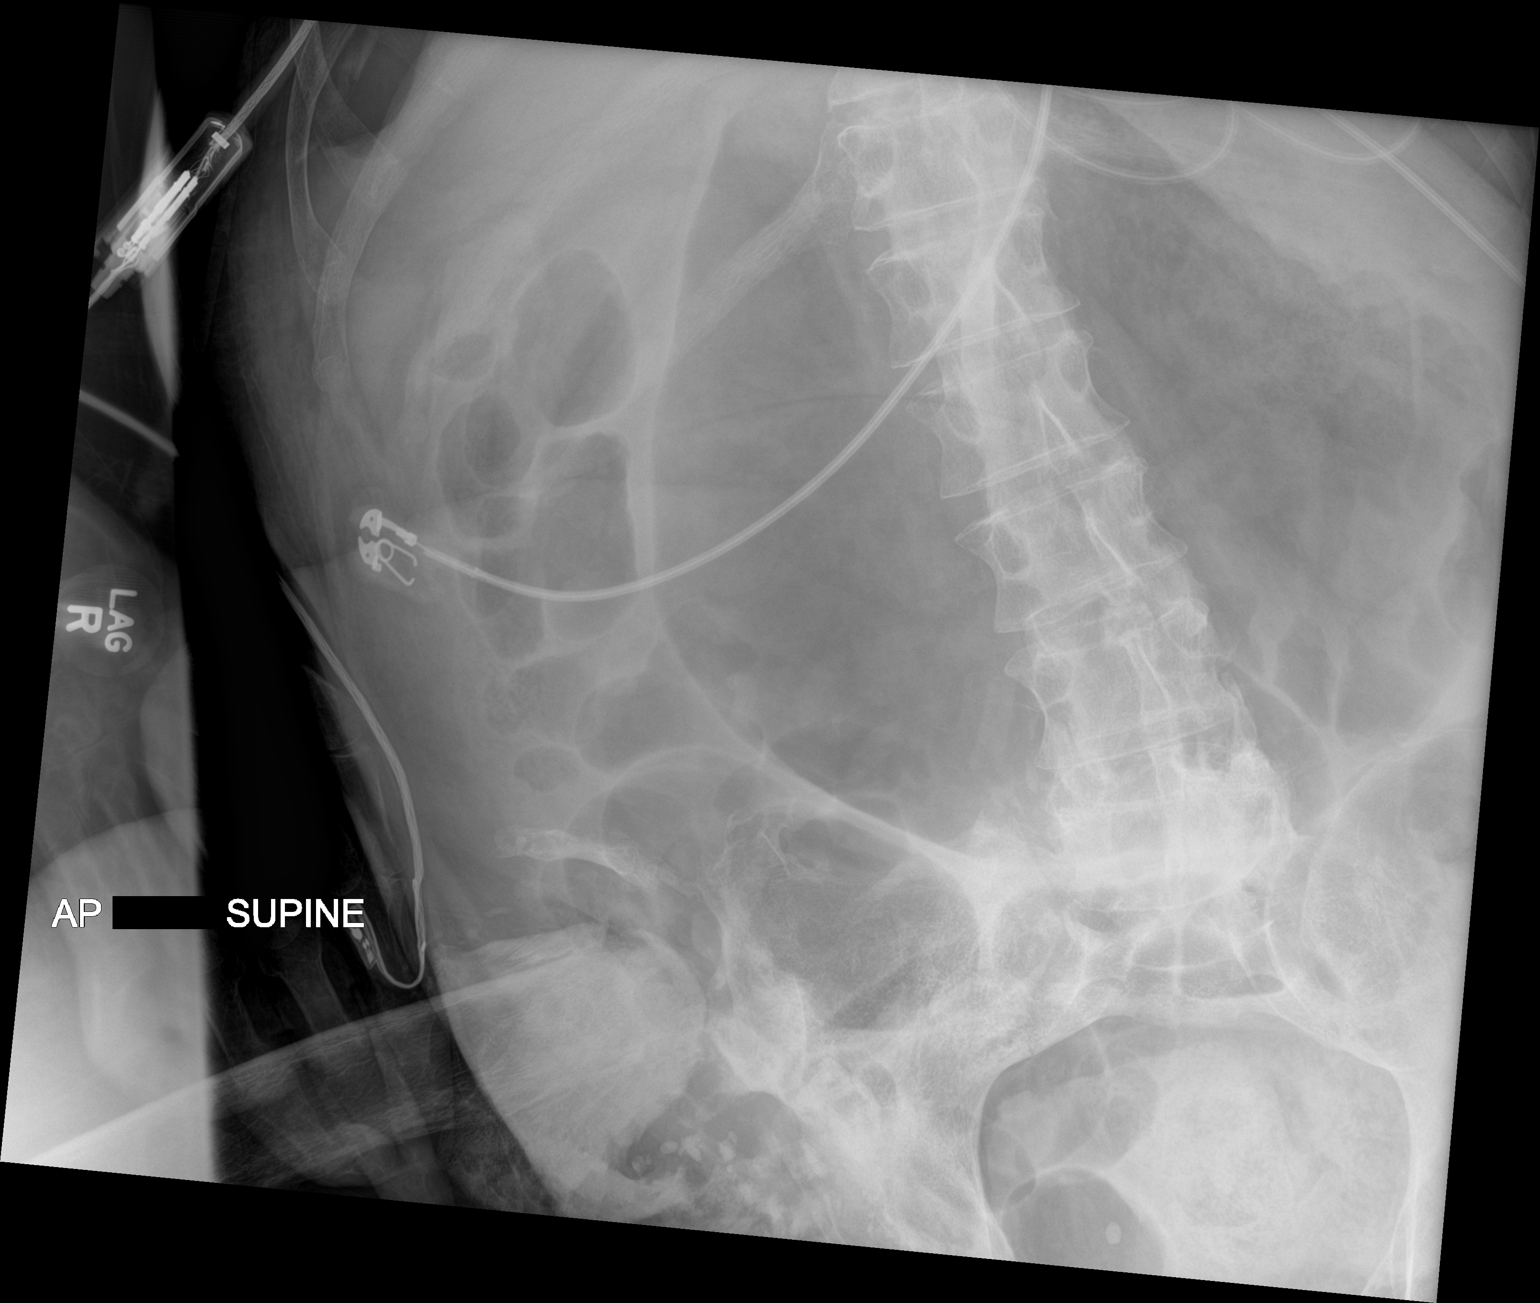

[abdomen kub (2 of 2)]
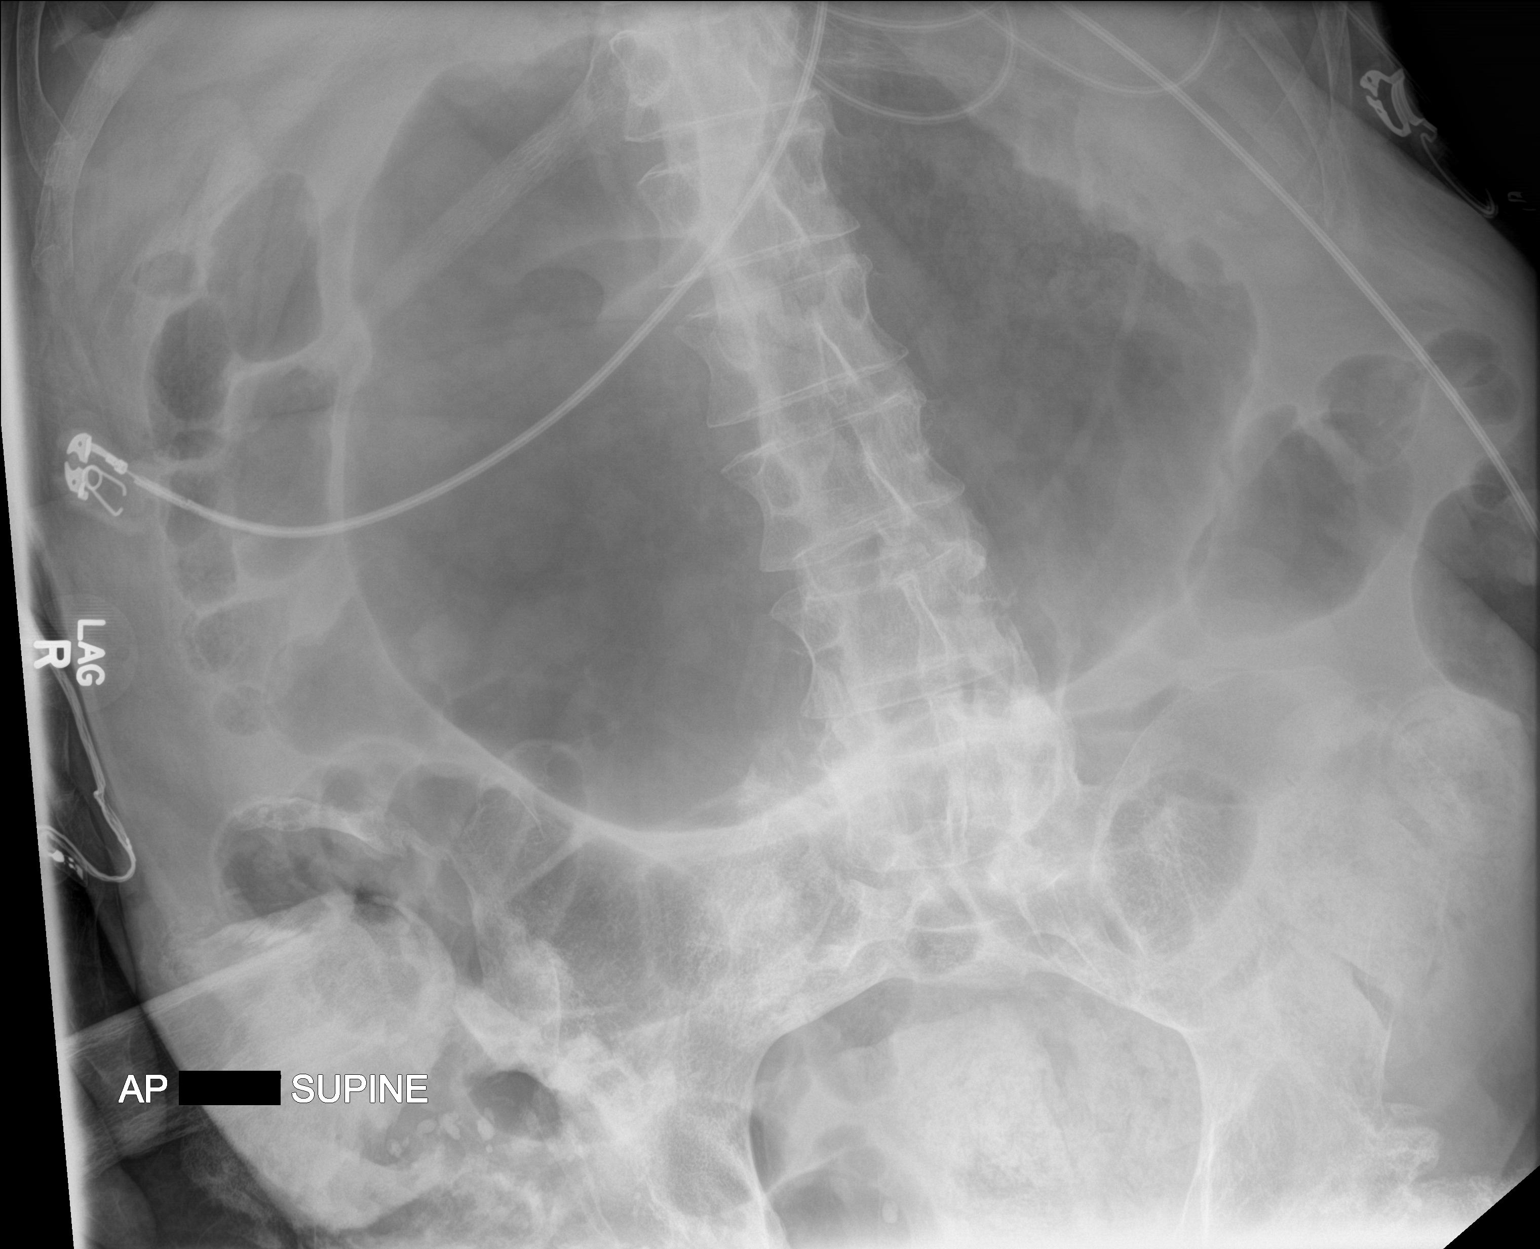

[2 of 2 positions shown; findings below may reference images not displayed]

FINDINGS: Marked air distention of the stomach as before. Scattered gas within
the small bowel and colon. Acetabular dysplasia with superior
dislocation of the proximal right femur and chronic deformity.
IMPRESSION: 1. Marked air distention of the stomach, similar compared to
previous exams. Remainder of the gas pattern is nonobstructed
2. Chronic deformities of the pelvis and bilateral hips.

## 2020-03-02 IMAGING — CT CT CHEST W/O CM
2 of 3 series · 15 of 36 positions shown, 18 images · non-contrast
Comparison: CT scan of October 26, 2010.  MRI November 01, 2016.

CLINICAL DATA: Shortness of breath.  Fever.

EXAM:
CT CHEST WITHOUT CONTRAST
TECHNIQUE: Multidetector CT imaging of the chest was performed following the
standard protocol without IV contrast.

[Series 2: thorax · axial · 0.82mm/px · z∈[+1316,+1560]mm · 12 of 144 slices shown, 15 images]
[im 11/144  mediastinal]
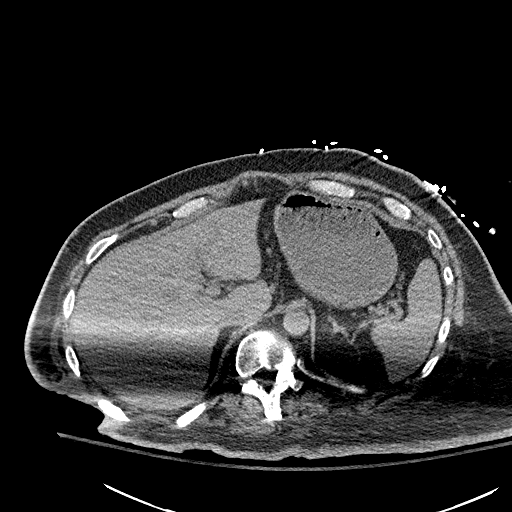
[im 11/144  lung]
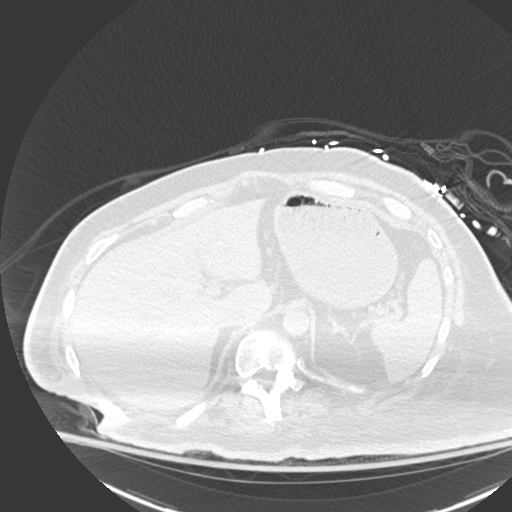
[im 22/144  lung]
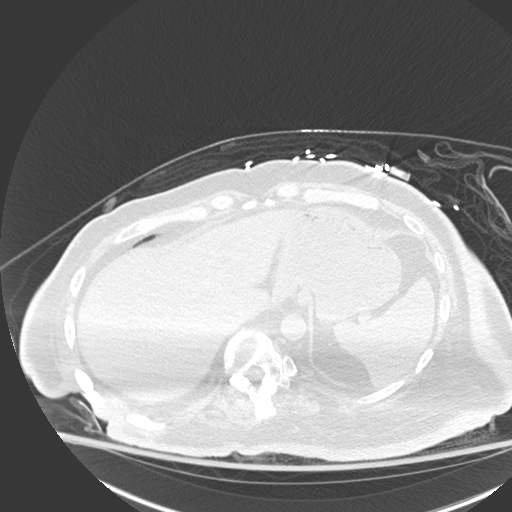
[im 32/144  lung]
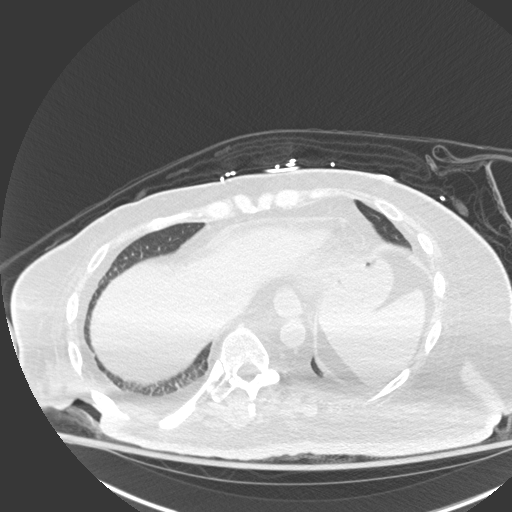
[im 43/144  lung]
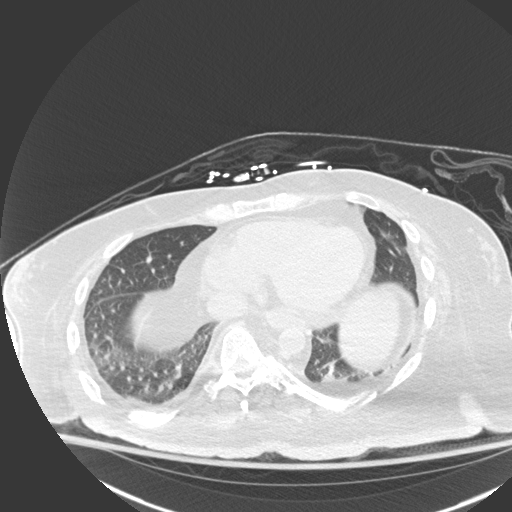
[im 53/144  mediastinal]
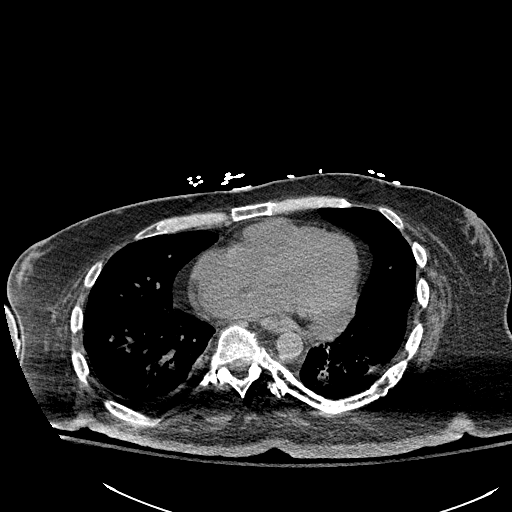
[im 53/144  lung]
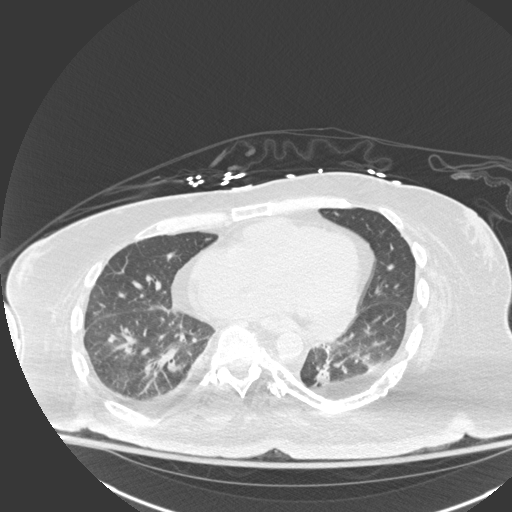
[im 64/144  lung]
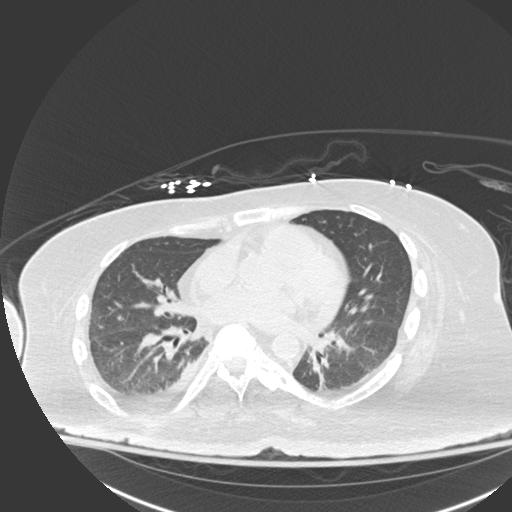
[im 80/144  lung]
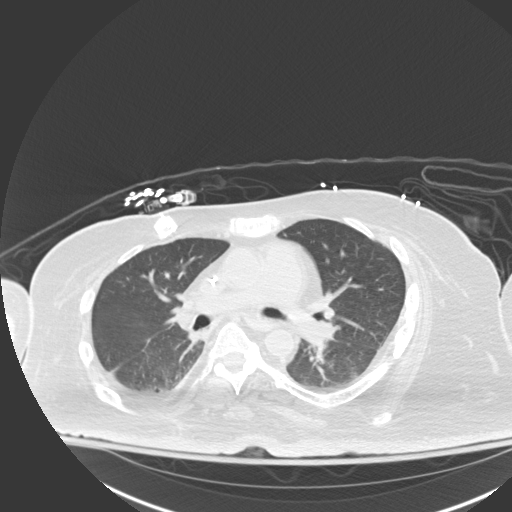
[im 91/144  lung]
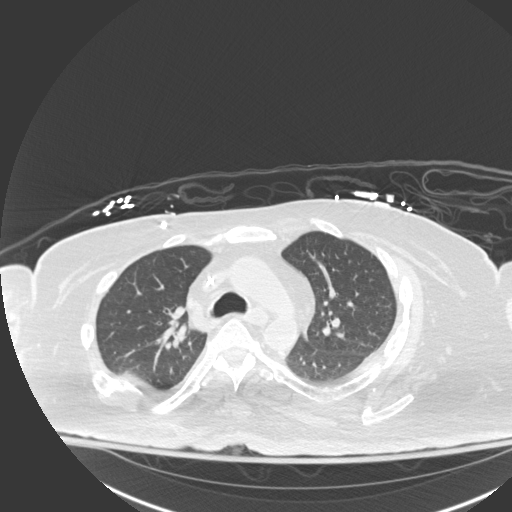
[im 101/144  mediastinal]
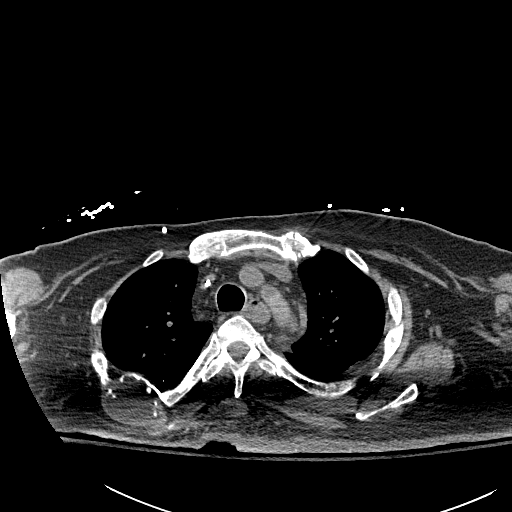
[im 101/144  lung]
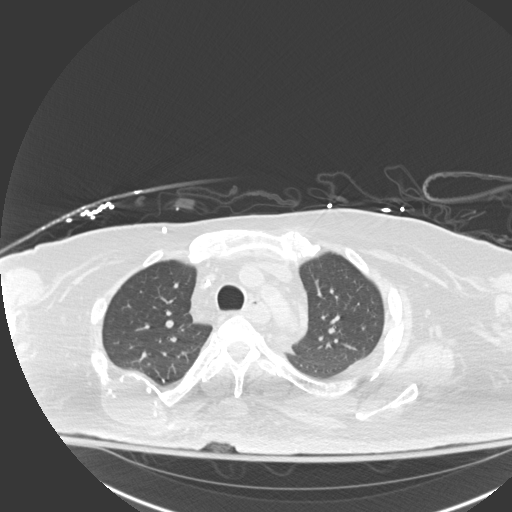
[im 112/144  lung]
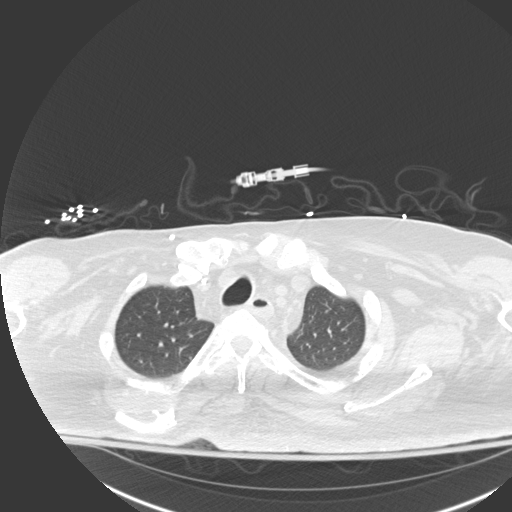
[im 122/144  lung]
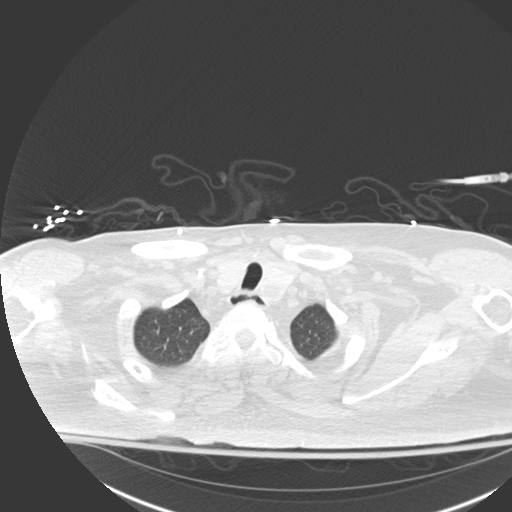
[im 133/144  lung]
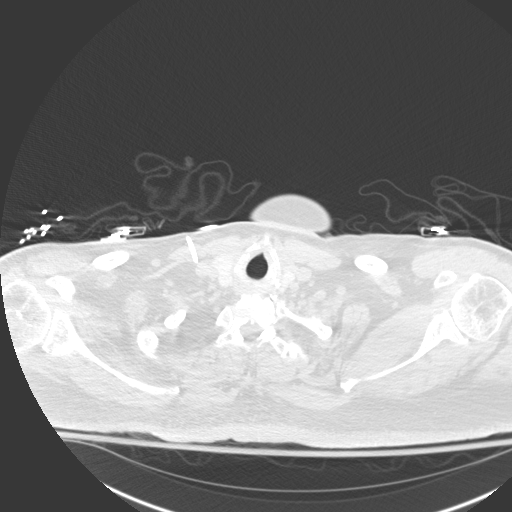

[Series 6: coronal · coronal · 0.58mm/px · 3 of 151 slices shown]
[im 31/151  lung]
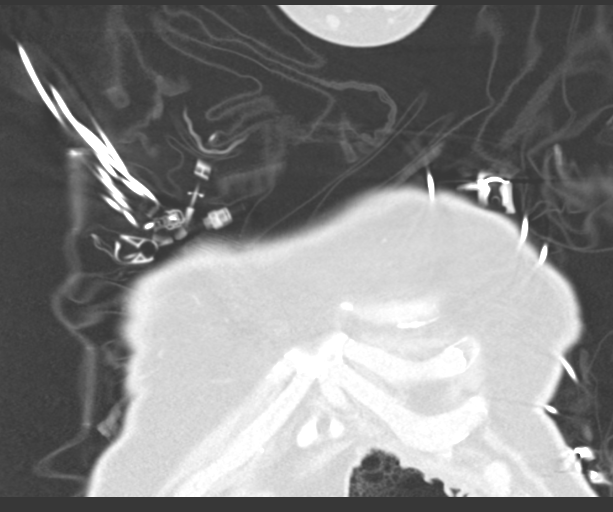
[im 61/151  lung]
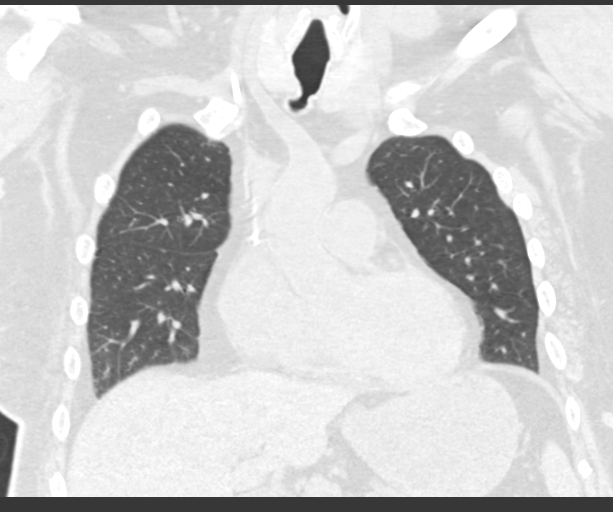
[im 91/151  lung]
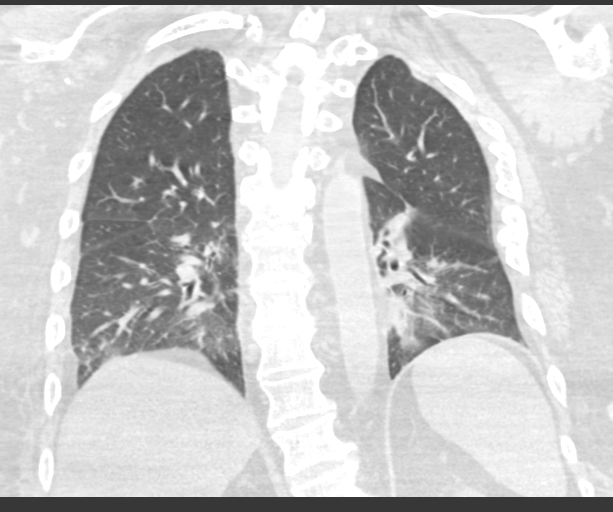

[15 of 36 positions shown; findings below may reference images not displayed]

FINDINGS: Cardiovascular: No significant vascular findings. Normal heart size.
No pericardial effusion. Right internal jugular Port-A-Cath is
noted.

Mediastinum/Nodes: 2 cm left thyroid nodule is noted. No mediastinal
adenopathy is noted. Esophagus is unremarkable.

Lungs/Pleura: No pneumothorax is noted. No significant pleural
effusion is noted. Minimal bilateral posterior basilar subsegmental
atelectasis is noted.

Upper Abdomen: No acute abnormality.

Musculoskeletal: No acute fracture is seen involving the ribs or
spine. There appears to be fusion of several portions of several
left ribs most consistent with congenital anomaly. Expansile lesion
measuring 8.9 x 4.9 cm is seen involving the posterior portion of
the right fifth rib which extends to the anterior surface of the
inferior portion of the scapula. This is significantly increased in
size compared to prior exam of 2262.
IMPRESSION: 2 cm left thyroid nodule is noted. Thyroid ultrasound is recommended
for further evaluation.

Minimal bilateral posterior basilar subsegmental atelectasis is
noted.

8.9 x 4.9 cm expansile lesion is seen involving the posterior
portion of right fifth rib which extends to the anterior surface of
the inferior portion of the scapula. This is significantly increased
in size compared to prior exam and is concerning for enlarging
enchondroma, low grade chondrosarcoma or possibly chondromyxoid
fibroma. MRI may be performed for further evaluation.

## 2020-03-03 IMAGING — NM NM BONE WHOLE BODY
2 series · 2 of 2 positions shown · non-contrast
Comparison: 07/19/2017

CLINICAL DATA: Rib mass identified on CT.

EXAM:
NUCLEAR MEDICINE WHOLE BODY BONE SCAN
TECHNIQUE: Whole body anterior and posterior images were obtained approximately
3 hours after intravenous injection of radiopharmaceutical.
RADIOPHARMACEUTICALS:  Twenty-two mCi 0echnetium-EEm MDP IV

[Series 1: [id number] whole body · 2.66mm/px · 1 of 1 slices shown (1 of 2)]
[im 1/1]
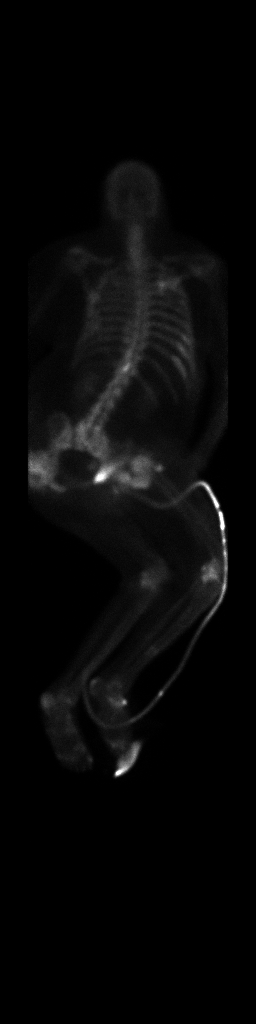

[Series 1: [id number]_40 whole body · 2.66mm/px · 1 of 1 slices shown (2 of 2)]
[im 1/1]
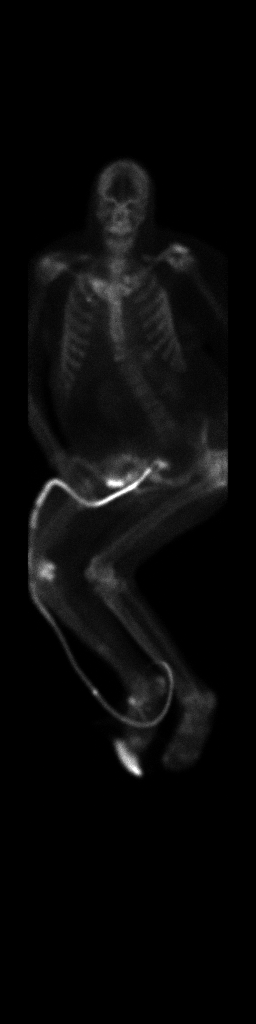

[2 of 2 positions shown; findings below may reference images not displayed]

FINDINGS: There are no specific findings identified to suggest metastatic
disease.

Photopenic area corresponds to the expansile lesion involving the
right posterior fifth rib. There are areas of increased uptake
localizing to the posterolateral aspect of the eighth, ninth and
tenth right ribs. On the corresponding CT image there is overlying
soft tissue ulceration.

Small focus of mild increased uptake localizing to the posterior
aspect of the T12 vertebra is identified. Increased radiotracer
uptake localizing to both hips likely reflects a combination of
chronic osteomyelitis and arthropathic changes.

Physiologic tracer activity within both kidneys and the urinary
bladder noted.
IMPRESSION: 1. The expansile lesion involving the right posterior fifth rib on
recent chest CT is relatively photopenic on bone scan. This is a
nonspecific finding and does not necessarily confer benignity.
2. Increased uptake localizing to right posterolateral eighth, ninth
and tenth ribs is favored to represent sequelae of chronic
osteomyelitis. There also chronic posttraumatic and postinflammatory
arthropathic changes involving both sides of pelvis and hips.

## 2020-03-04 IMAGING — US US FNA BIOPSY THYROID 1ST LESION
1 series · 13 of 13 positions shown · non-contrast
Comparison: Thyroid ultrasound dated 07/19/2017

MEDICATIONS:
None

COMPLICATIONS:
None immediate.

INDICATION: Patient with history of incidental thyroid nodule noted on CT and
follow-up thyroid ultrasound on 07/19/2017 which revealed a 2 cm
nodule in the left inferior thyroid lobe which meets criteria for
needle biopsy. He presents today for needle aspirate biopsy of this
left thyroid nodule.

EXAM:
ULTRASOUND GUIDED FINE NEEDLE ASPIRATION BIOPSY OF LEFT INFERIOR
THYROID NODULE
TECHNIQUE: Informed written consent was obtained from the patient after a
discussion of the risks, benefits and alternatives to treatment.
Questions regarding the procedure were encouraged and answered. A
timeout was performed prior to the initiation of the procedure.

[Series 1: us fna biopsy thyroid 1st lesion · 0.10mm/px · 13 acquisitions, 13 frames shown]
[im 1/13]
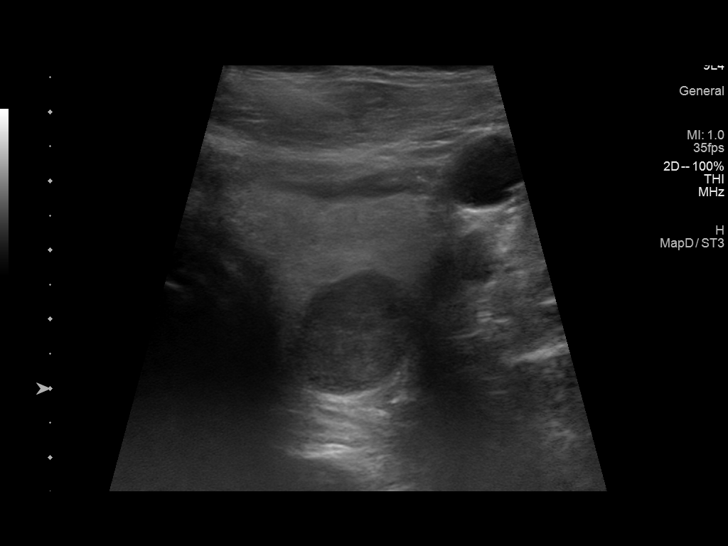
[im 2/13]
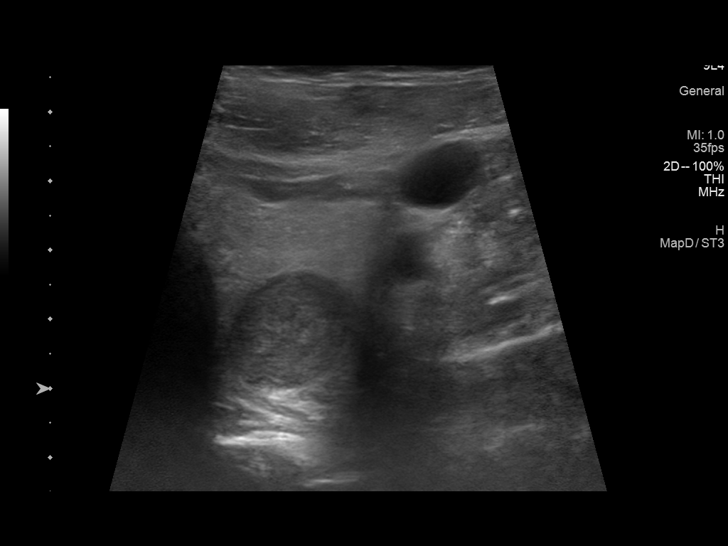
[im 3/13]
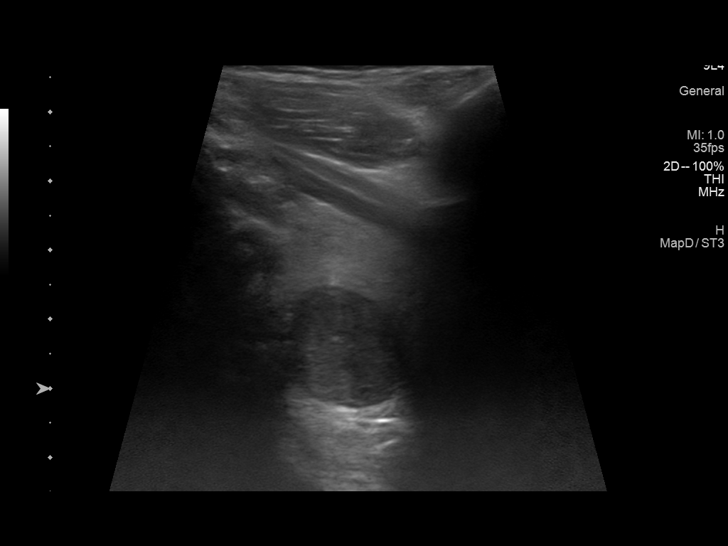
[im 4/13]
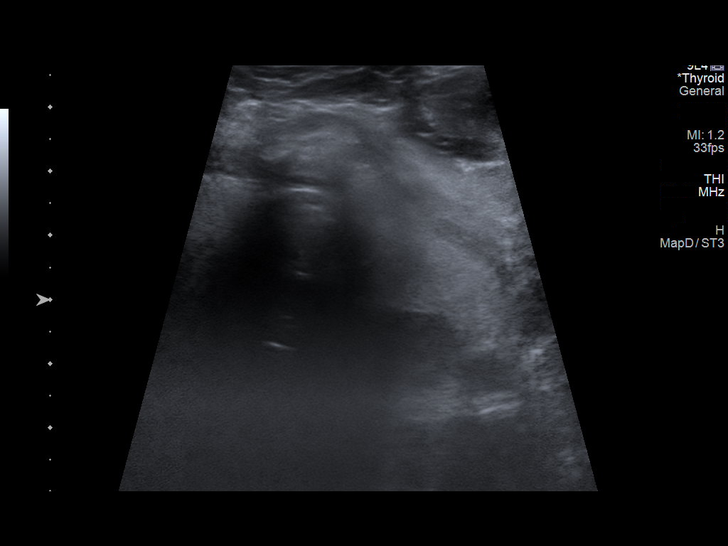
[im 5/13]
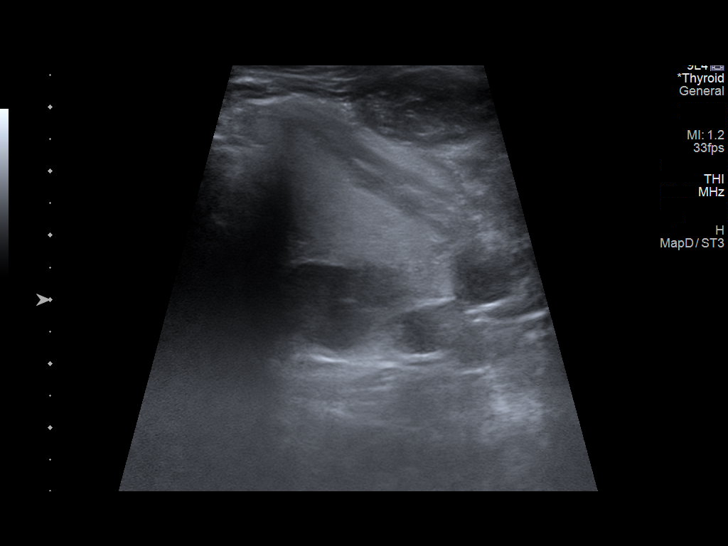
[im 6/13]
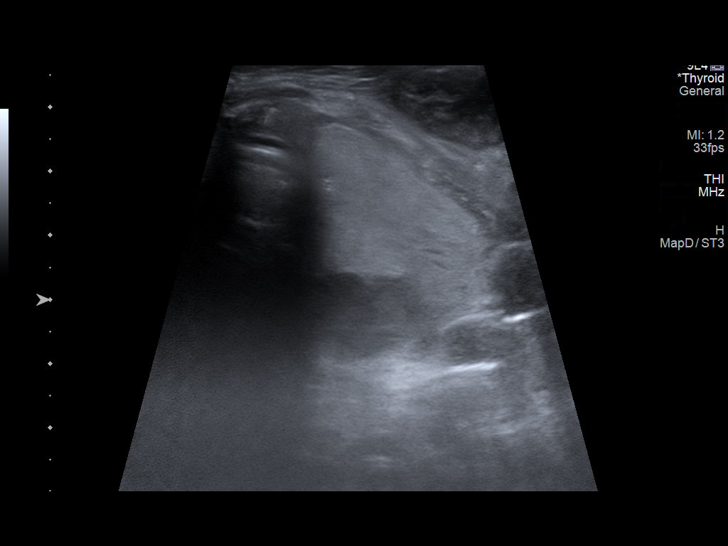
[im 7/13]
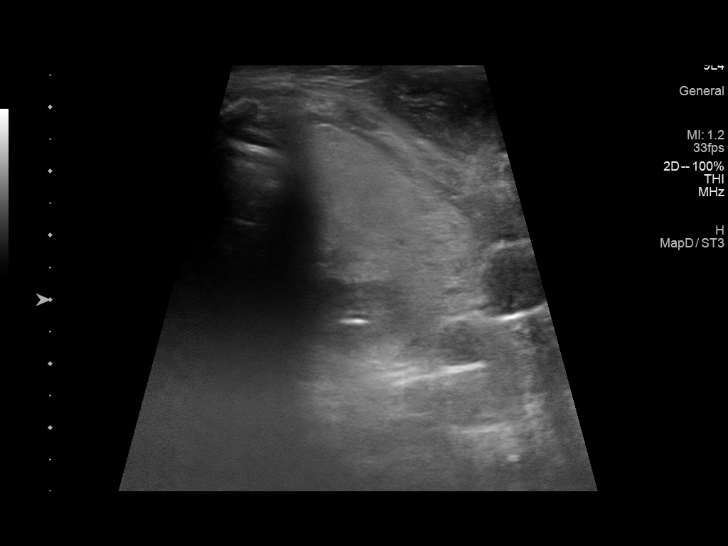
[im 8/13]
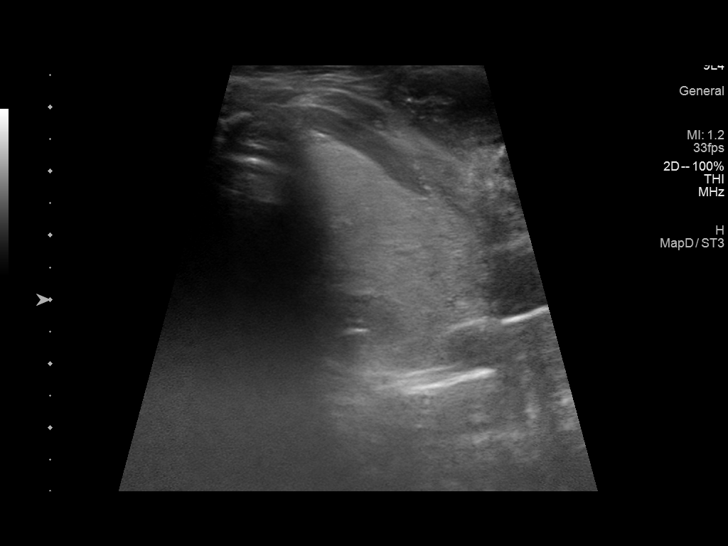
[im 9/13]
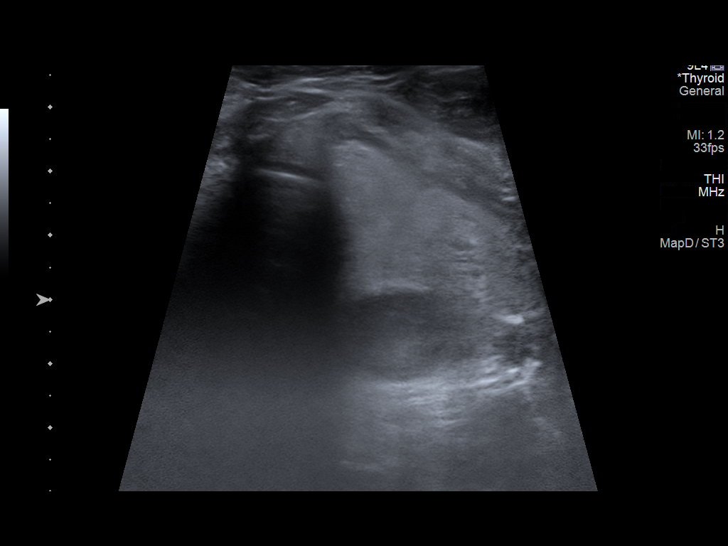
[im 10/13]
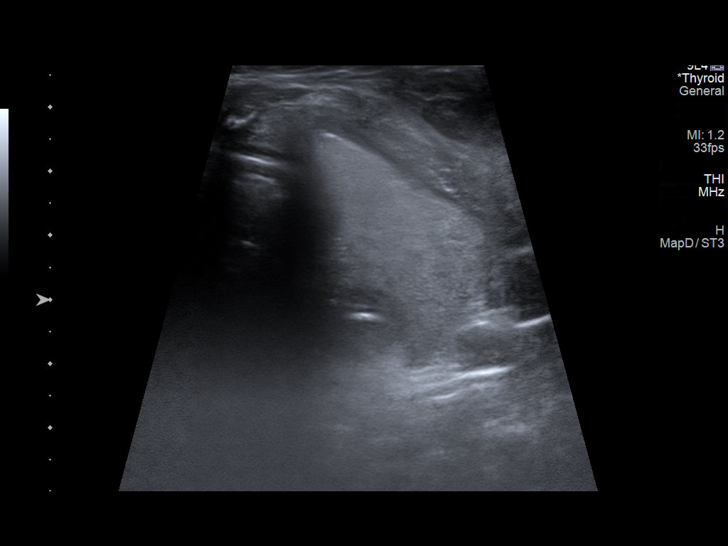
[im 11/13]
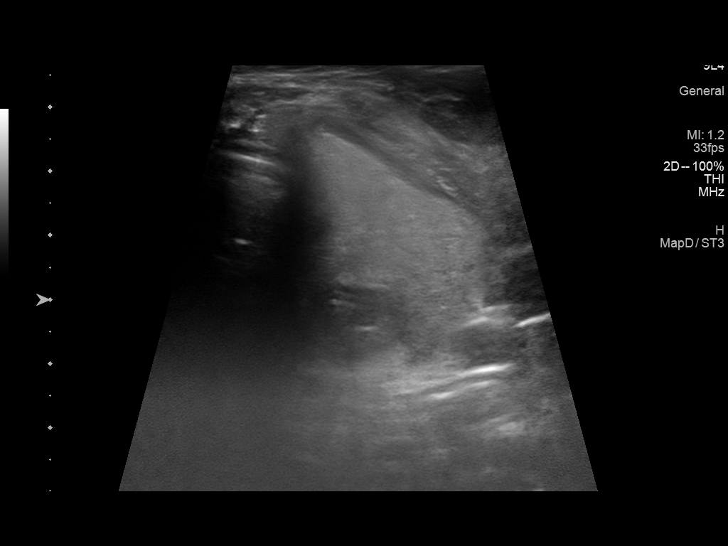
[im 12/13]
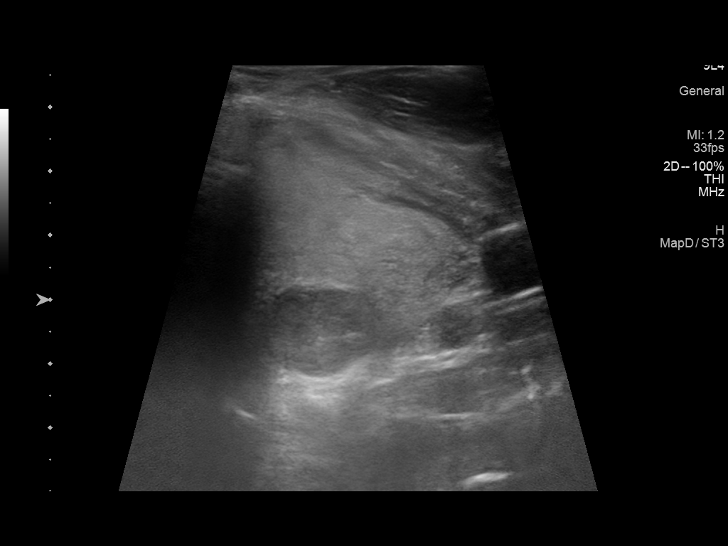
[im 13/13]
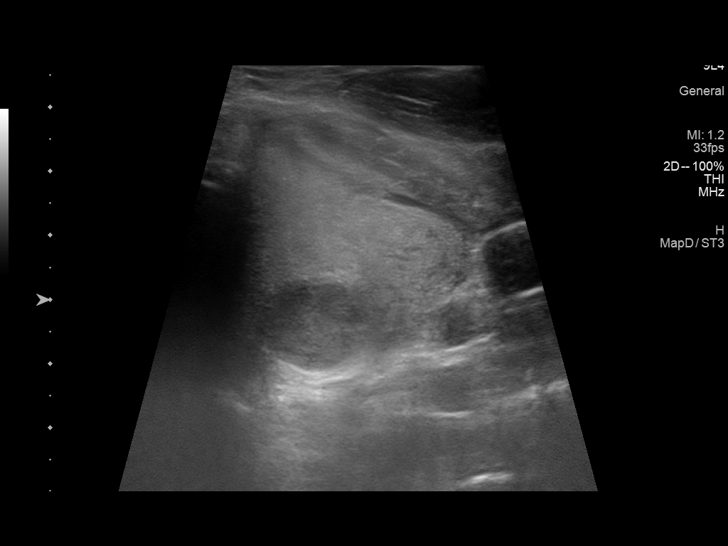

[13 of 13 positions shown; findings below may reference images not displayed]

Pre-procedural ultrasound scanning demonstrated unchanged size and
appearance of the indeterminate nodule within the left thyroid lobe

The procedure was planned. The neck was prepped in the usual sterile
fashion, and a sterile drape was applied covering the operative
field. A timeout was performed prior to the initiation of the
procedure. Local anesthesia was provided with 1% lidocaine.

Under direct ultrasound guidance, 4 FNA biopsies were performed of
the left inferior thyroid nodule with 22 gauge needles. Multiple
ultrasound images were saved for procedural documentation purposes.
The samples were prepared and submitted to pathology.

Limited post procedural scanning was negative for hematoma or
additional complication. Dressings were placed. The patient
tolerated the above procedures procedure well without immediate
postprocedural complication.
FINDINGS: Nodule reference number based on prior diagnostic ultrasound: 1

Maximum size: 2.0 cm

Location: Left; Inferior

ACR TI-RADS risk category: TR4 (4-6 points)

Reason for biopsy: meets ACR TI-RADS criteria

Ultrasound imaging confirms appropriate placement of the needles
within the thyroid nodule.
IMPRESSION: Technically successful ultrasound guided fine needle aspiration
biopsy of left inferior thyroid nodule. Final pathology pending.

## 2020-04-20 IMAGING — CR DG CHEST 2V
3 series · 3 of 3 positions shown · non-contrast
Comparison: 07/18/2017

CLINICAL DATA: Disorientation, possible UTI, chronic respiratory
failure, hypertension

EXAM:
CHEST - 2 VIEW

[x chest ap (1 of 2)]
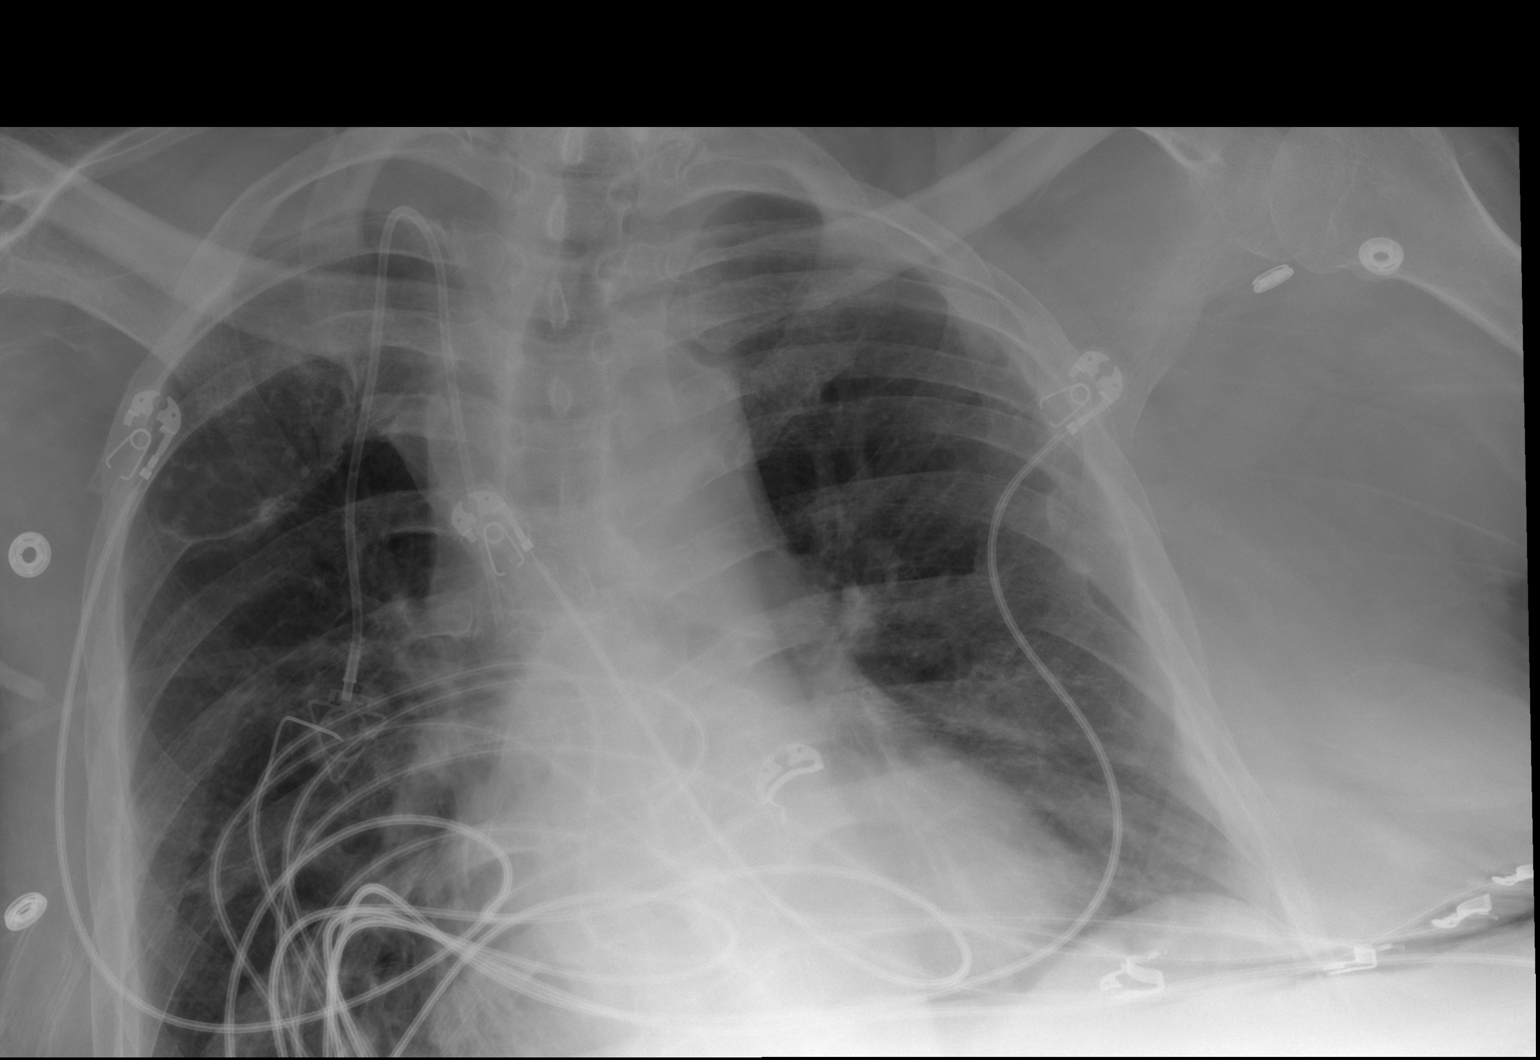

[x chest ap (2 of 2)]
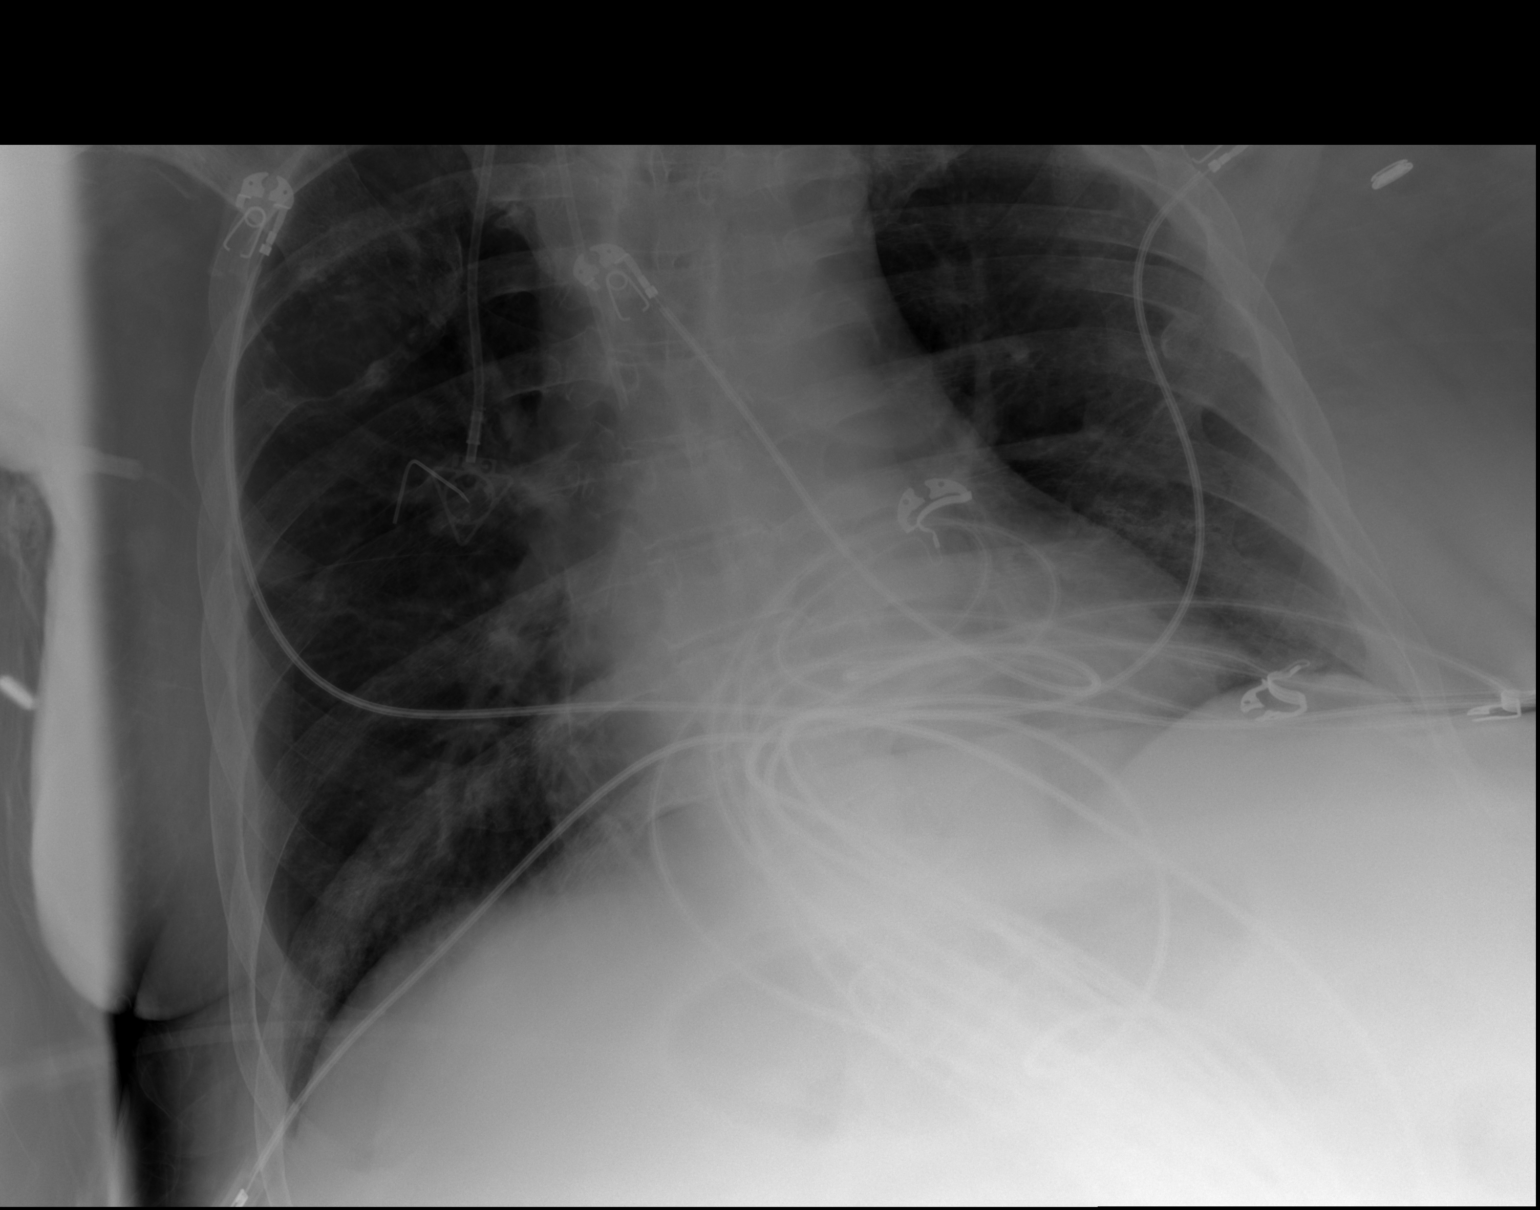

[w chest lat]
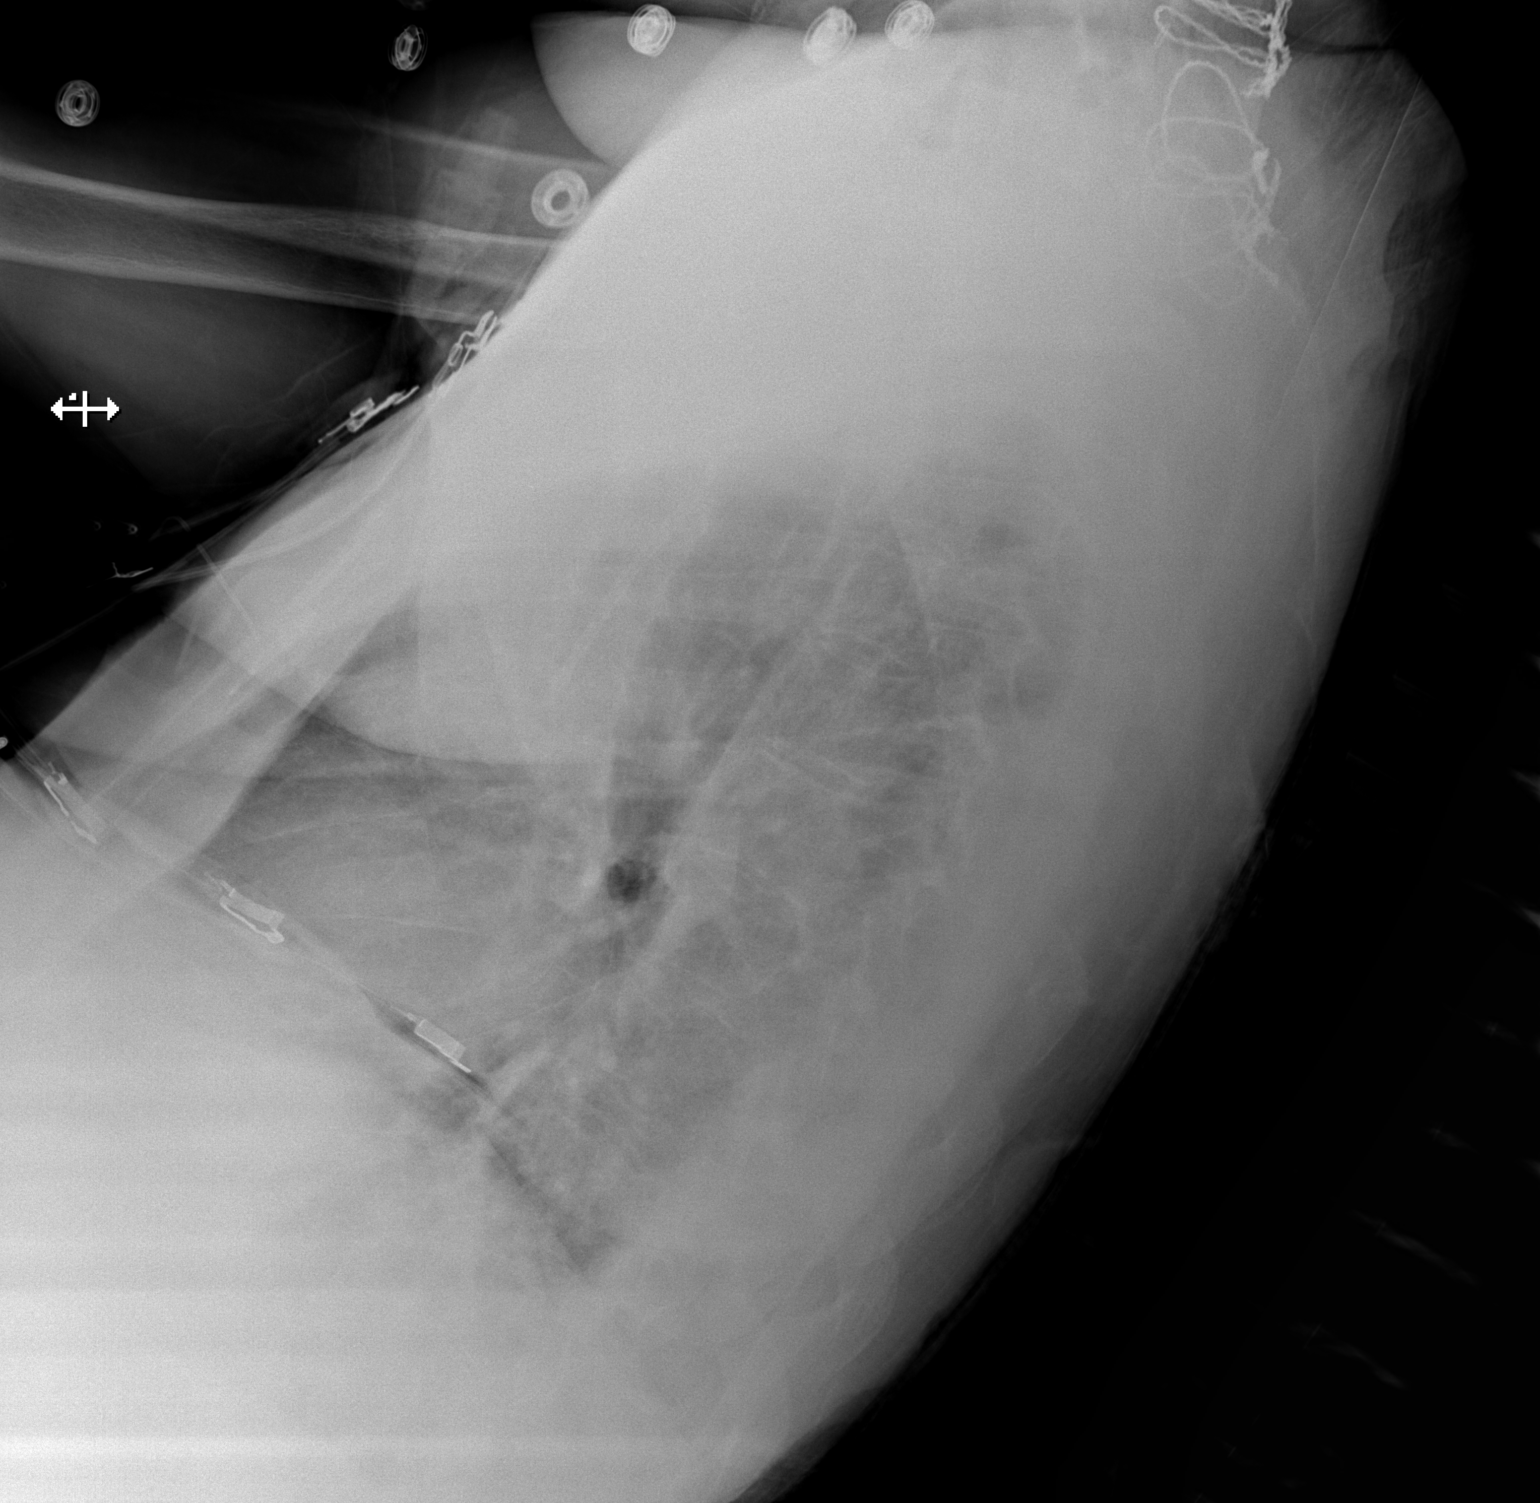

[3 of 3 positions shown; findings below may reference images not displayed]

FINDINGS: RIGHT jugular portacath with tip projecting over SVC.

Upper normal heart size.

Mediastinal contours and pulmonary vascularity normal.

Lungs clear.

No pleural effusion or pneumothorax.

Expansile bone lesion in posterior RIGHT 5th rib again seen.

Posterior LEFT rib deformities again noted.
IMPRESSION: No acute abnormalities.

Chronic expansile bone lesion of the posterior RIGHT rib.

## 2020-05-16 IMAGING — CT CT ABD-PELV W/ CM
2 of 4 series · 16 of 46 positions shown, 18 images · IV contrast (ISOVUE)
Comparison: CT abdomen and pelvis 05/04/2017.

CLINICAL DATA: Abdominal pain, acute, generalized. Abdominal
distention. Nausea and vomiting beginning today.

EXAM:
CT ABDOMEN AND PELVIS WITH CONTRAST
TECHNIQUE: Multidetector CT imaging of the abdomen and pelvis was performed
using the standard protocol following bolus administration of
intravenous contrast.
CONTRAST:  100mL [id number] IOPAMIDOL ([id number]-A77) INJECTION 61%

[Series 3: axial st · axial · 0.98mm/px · z∈[-340,+110]mm · 13 of 100 slices shown, 15 images]
[im 5/100  soft-tissue]
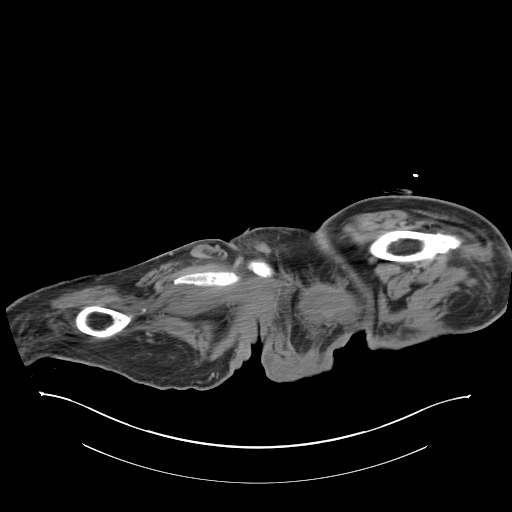
[im 5/100  bone]
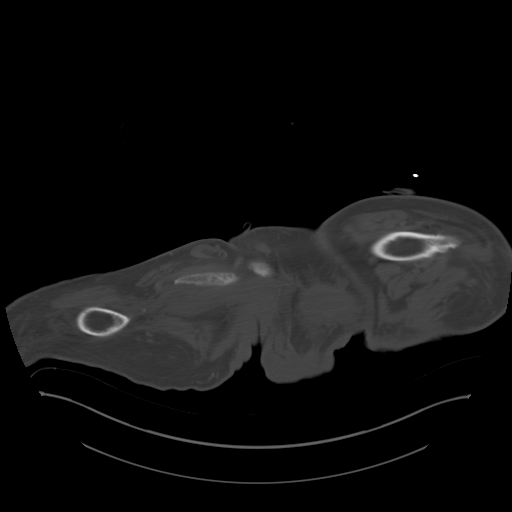
[im 14/100  soft-tissue]
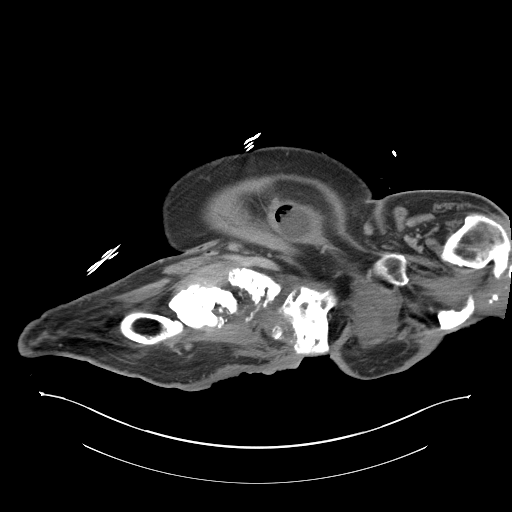
[im 23/100  soft-tissue]
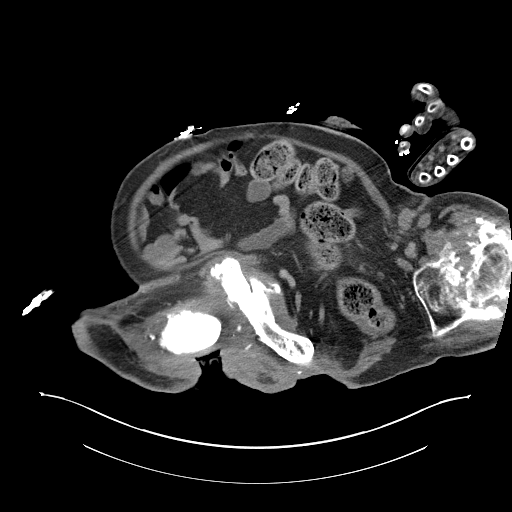
[im 28/100  soft-tissue]
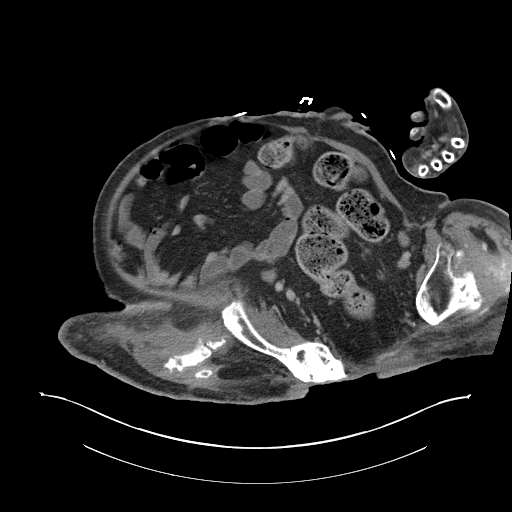
[im 37/100  soft-tissue]
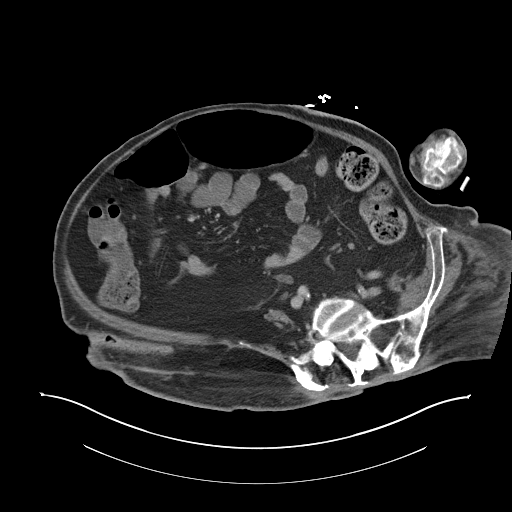
[im 41/100  soft-tissue]
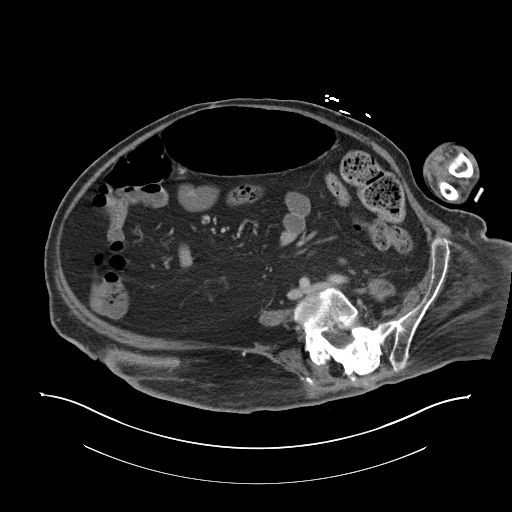
[im 50/100  soft-tissue]
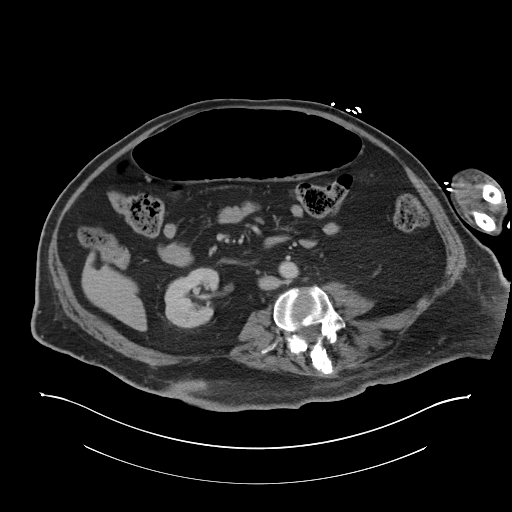
[im 59/100  soft-tissue]
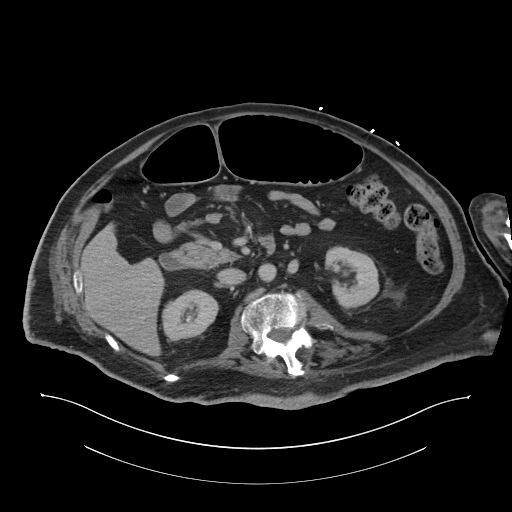
[im 64/100  soft-tissue]
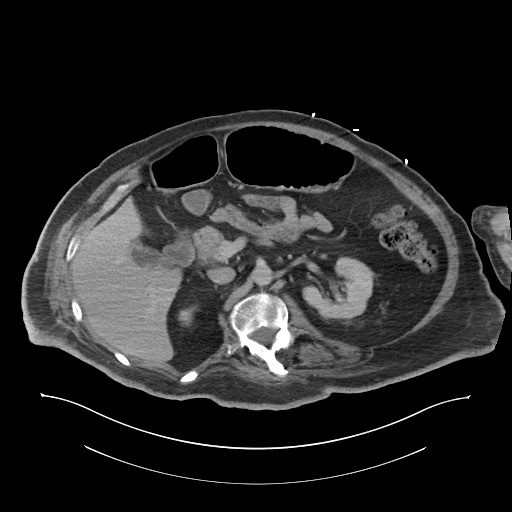
[im 64/100  bone]
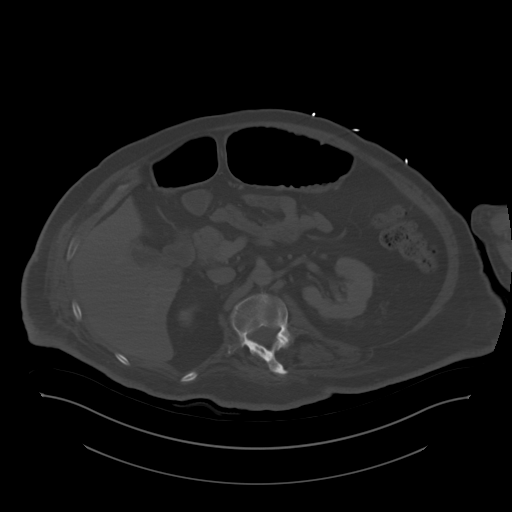
[im 73/100  soft-tissue]
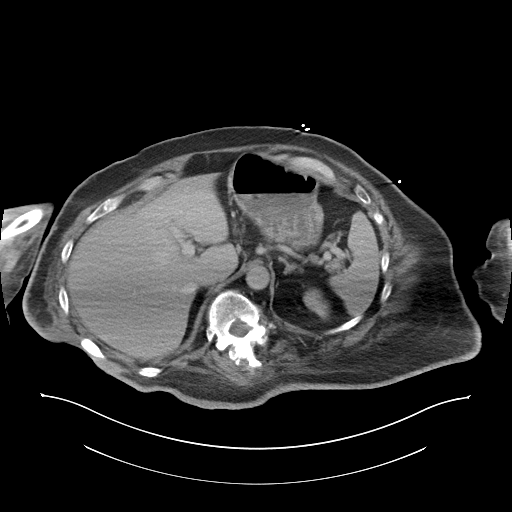
[im 77/100  soft-tissue]
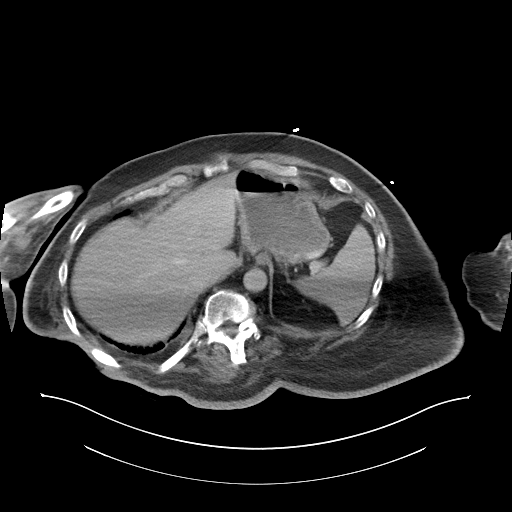
[im 86/100  soft-tissue]
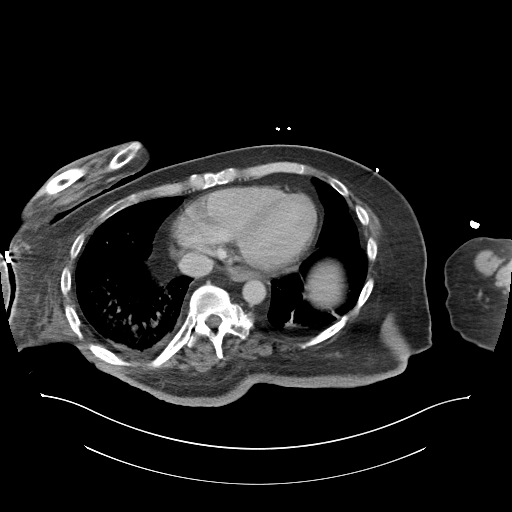
[im 95/100  soft-tissue]
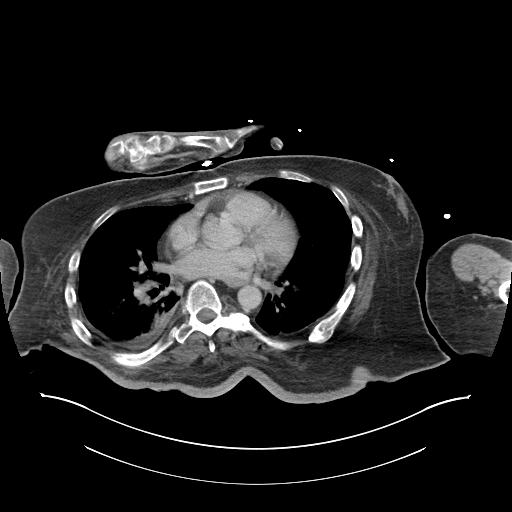

[Series 5: coronal st · coronal · 0.84mm/px · 3 of 101 slices shown]
[im 34/101  soft-tissue]
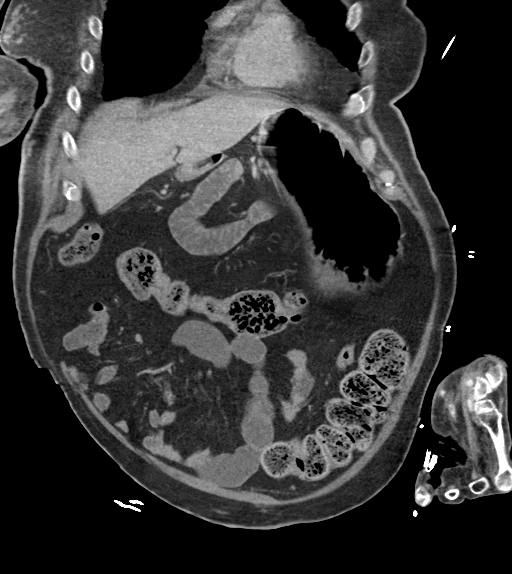
[im 45/101  soft-tissue]
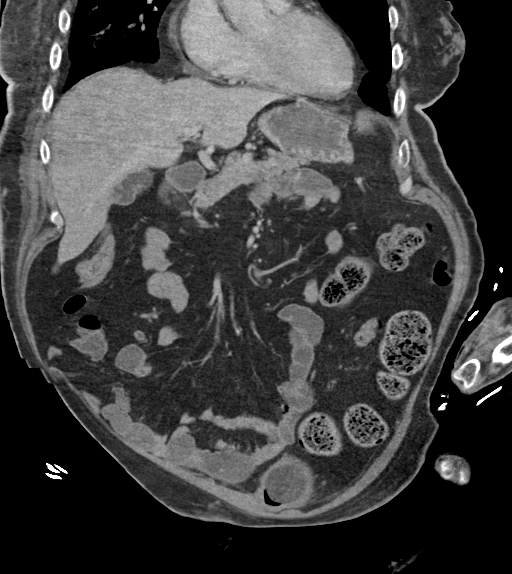
[im 56/101  soft-tissue]
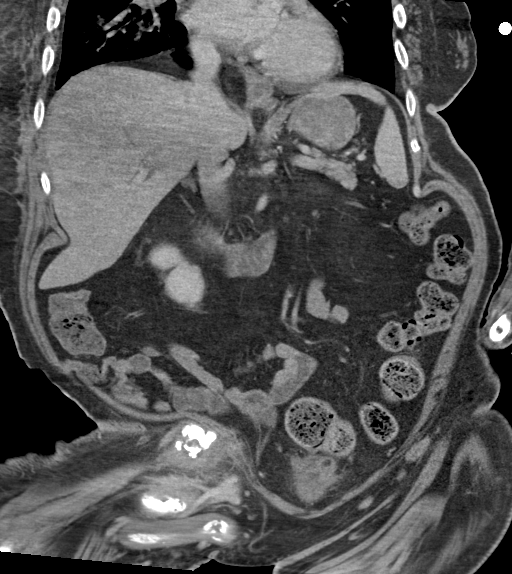

[16 of 46 positions shown; findings below may reference images not displayed]

FINDINGS: Lower chest: Right middle and lower lobe airspace disease is
present. There is minimal atelectasis or airspace disease the left
base. Small effusions are present bilaterally. Heart is upper limits
of normal for size. No significant pericardial effusion is present.

Hepatobiliary: No focal liver abnormality is seen. No gallstones,
gallbladder wall thickening, or biliary dilatation.

Pancreas: Unremarkable. No pancreatic ductal dilatation or
surrounding inflammatory changes.

Spleen: Normal in size without focal abnormality.

Adrenals/Urinary Tract: Adrenal glands are normal bilaterally. A
cm cyst is noted laterally in the left kidney. Kidneys and ureters
are otherwise within normal limits bilaterally. A suprapubic
catheter is present within the urinary bladder.

Stomach/Bowel: Marked gastric distention is again noted. There is no
obstruction. Small bowel is otherwise unremarkable. Small bowel
adhesions are present without obstruction. Terminal ileum is within
normal limits. The appendix is visualized and normal. Ascending and
transverse colon are within normal limits. Descending and sigmoid
colon are unremarkable.

Vascular/Lymphatic: No significant vascular findings are present. No
enlarged abdominal or pelvic lymph nodes.

Reproductive: Unremarkable.

Other: No free fluid or free air is present.

Musculoskeletal: Chronic dislocation of the right hip is again noted
superiorly. There is marked osseous destruction at the left hip.
Decubitus ulceration is present over the sacrum on the right and
posteriorly at the right femoral head. Additional ulceration is
present at the right ischial tuberosity and the right posterior mid
ribs. Sclerotic changes are again noted in the bones, likely
reflecting chronic infection. No deep abscess is present. Right hip
effusion is noted.
IMPRESSION: 1. No acute intra-abdominal process explain patient's distension or
pain.
2. Chronic gastric distention without obstruction.
3. Suprapubic Foley catheter.
4. Multiple decubitus ulcers and chronic sclerotic changes of the
bone as described. Infection about the right hip is not excluded.

## 2020-05-16 IMAGING — CR DG CHEST 2V
2 series · 2 of 2 positions shown · non-contrast
Comparison: Two-view chest x-ray 09/06/2017.

CLINICAL DATA: Cough.

EXAM:
CHEST - 2 VIEW

[x chest ap]
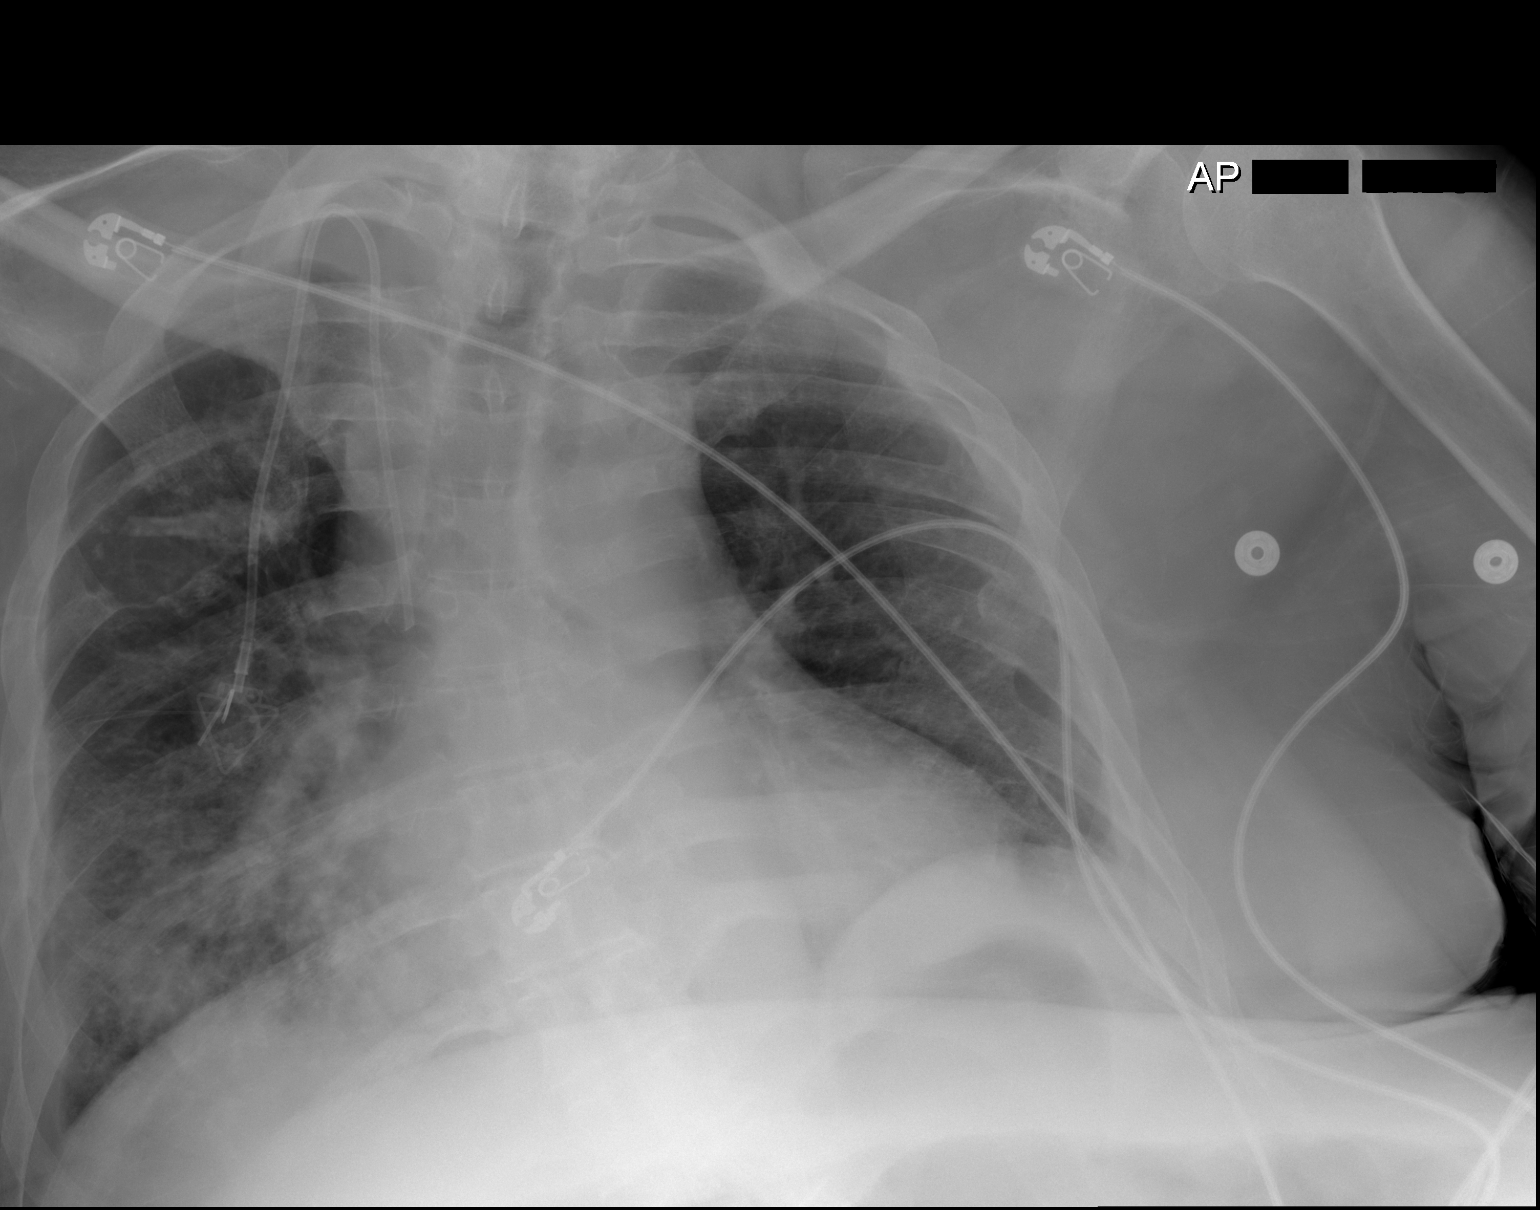

[w chest lat]
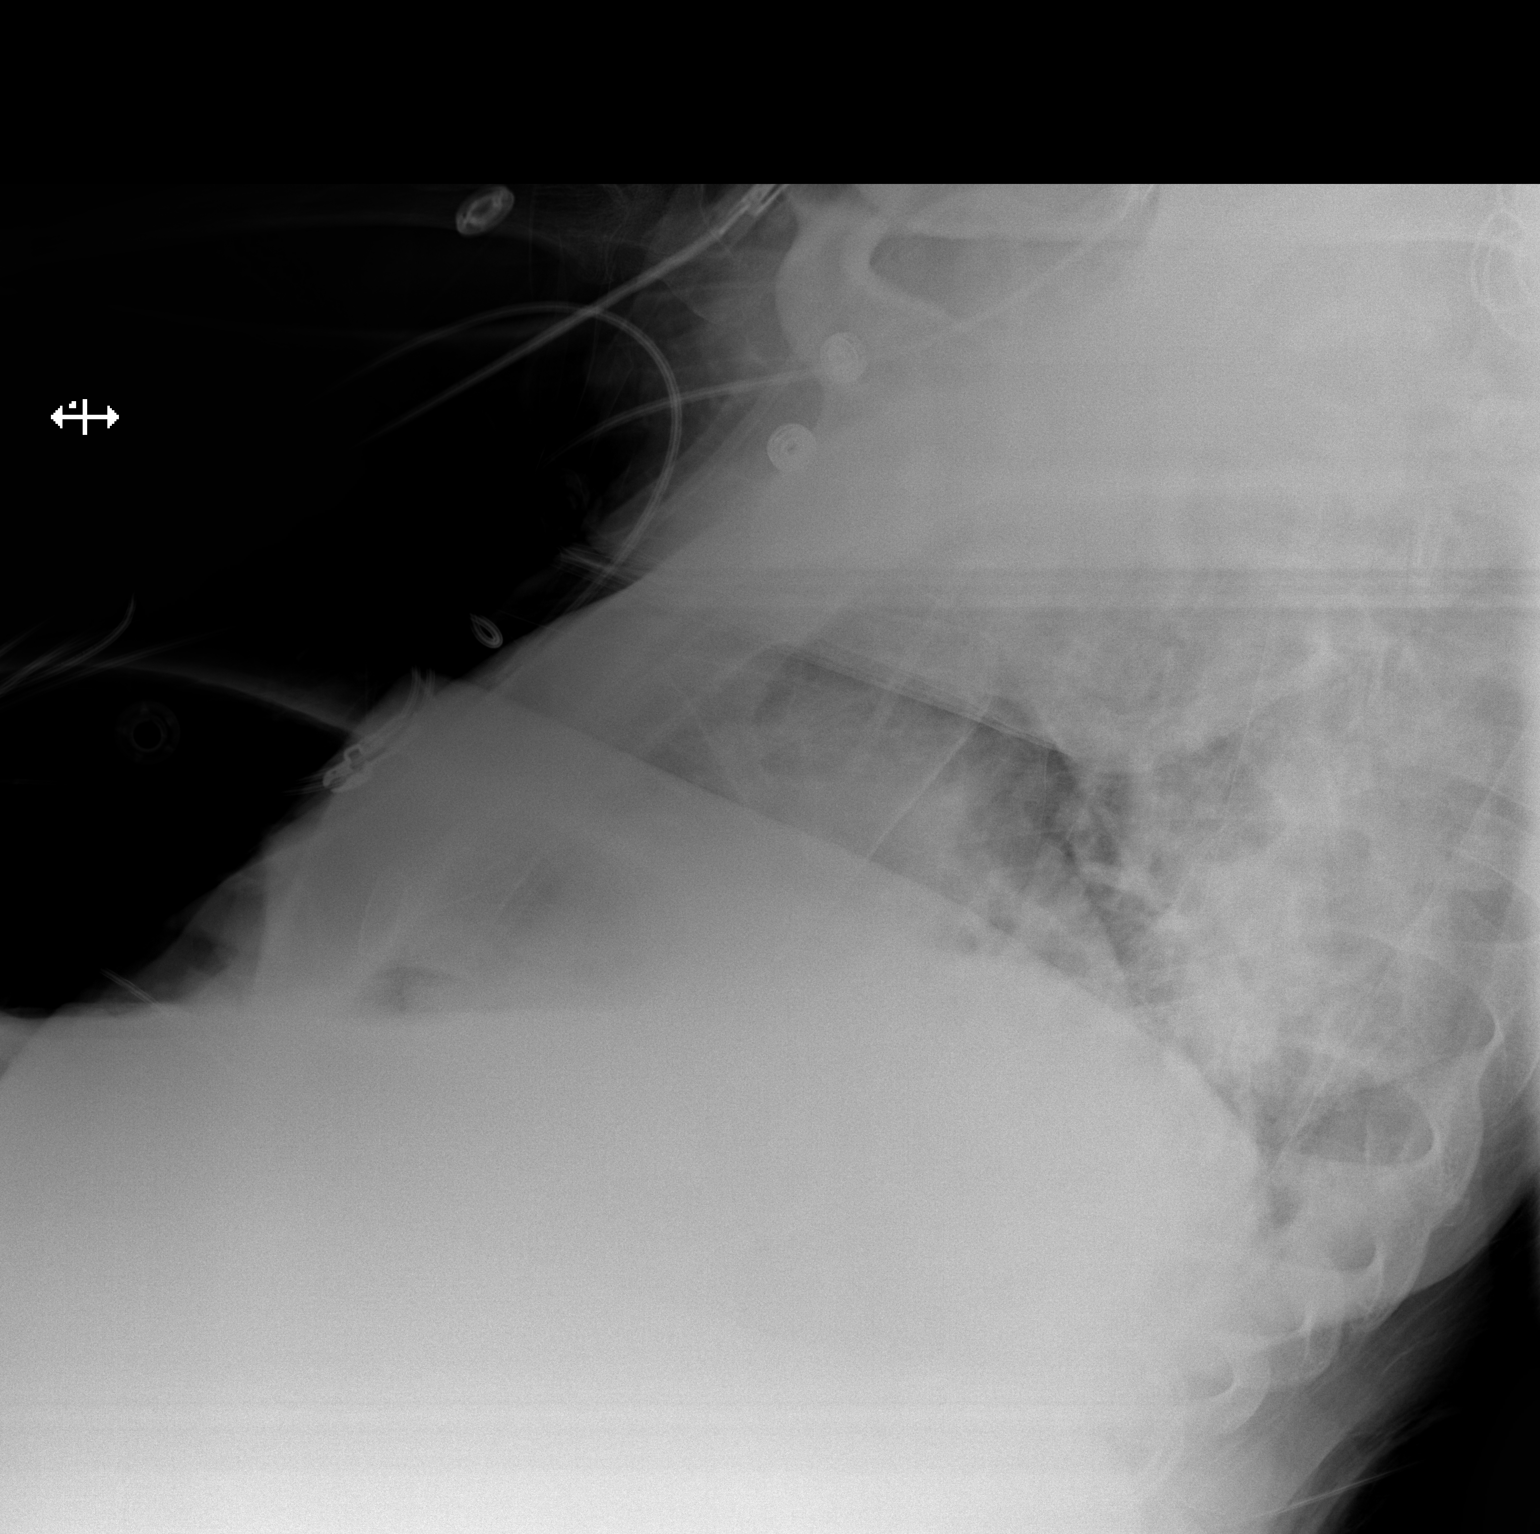

[2 of 2 positions shown; findings below may reference images not displayed]

FINDINGS: A right IJ Port-A-Cath is in place. Expansile posterior rib lesion
is again seen. Heart size normal. Lung volumes are low. New right
middle lobe and right lower lobe airspace disease is present.
IMPRESSION: 1. New right middle and lower lobe airspace disease compatible with
pneumonia.
2. Low lung volumes.

## 2020-05-19 IMAGING — DX DG CHEST 1V
1 series · 1 of 1 positions shown · non-contrast
Comparison: 10/02/2017

CLINICAL DATA: Follow-up pneumonia

EXAM:
CHEST  1 VIEW

[chest ap]
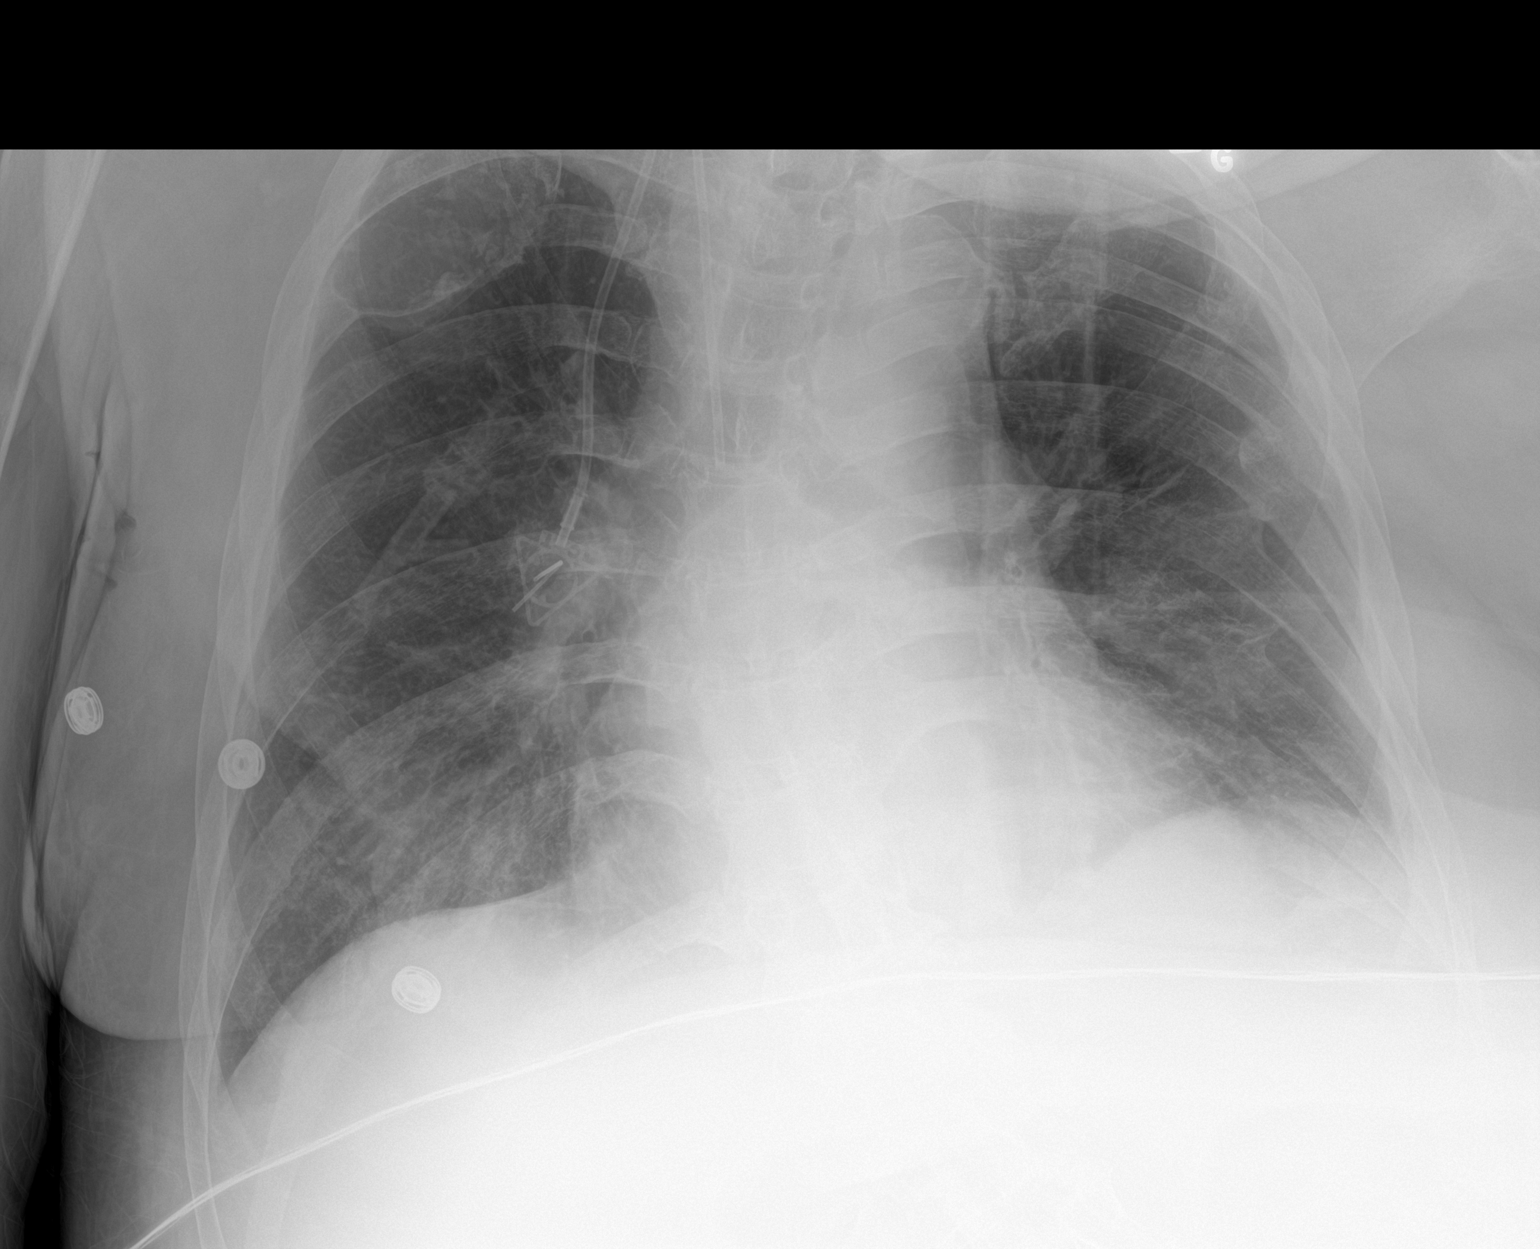

[1 of 1 positions shown; findings below may reference images not displayed]

FINDINGS: Cardiac shadow is stable. Right chest wall port is again seen. Lungs
are well aerated bilaterally. Improved aeration in the right base is
noted although some residual infiltrate is seen. Stable expansile
lesion in the posterior right rib cage is noted. No acute bony
abnormality is seen.
IMPRESSION: Improved aeration in the right lung base.

## 2020-06-13 IMAGING — CT CT ABD-PELV W/ CM
2 of 5 series · 16 of 46 positions shown, 18 images · IV contrast (ISOVUE)
Comparison: 10/02/2017 and prior CTs

CLINICAL DATA: 52-year-old male with acute abdominal pain and
nausea/vomiting for 5 days.

EXAM:
CT ABDOMEN AND PELVIS WITH CONTRAST
TECHNIQUE: Multidetector CT imaging of the abdomen and pelvis was performed
using the standard protocol following bolus administration of
intravenous contrast.
CONTRAST:  100 cc intravenous [id number]

[Series 2: axial st · axial · 0.90mm/px · z∈[+1020,+1445]mm · 13 of 101 slices shown, 15 images]
[im 8/101  soft-tissue]
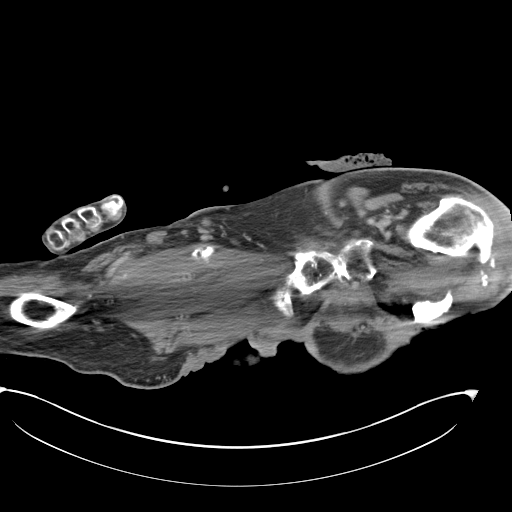
[im 8/101  bone]
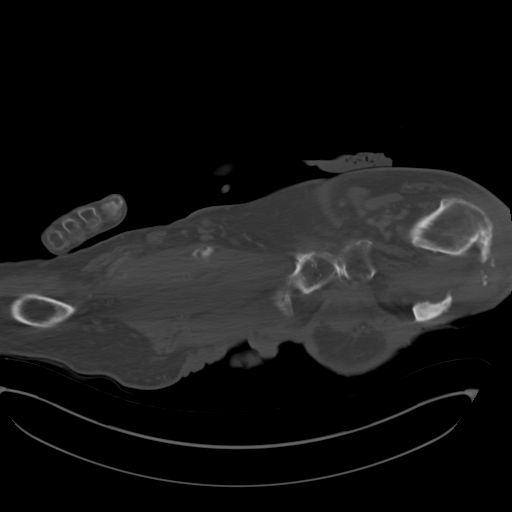
[im 15/101  soft-tissue]
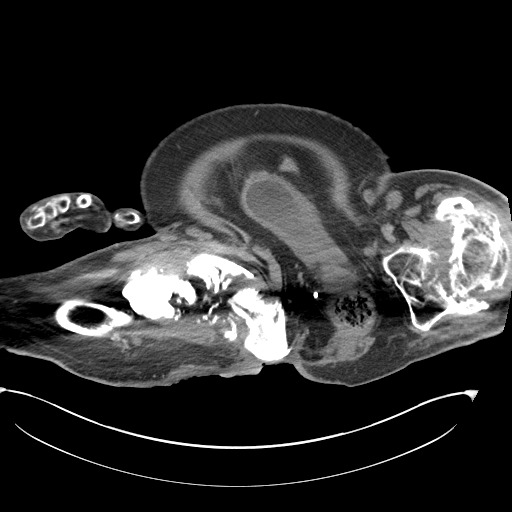
[im 22/101  soft-tissue]
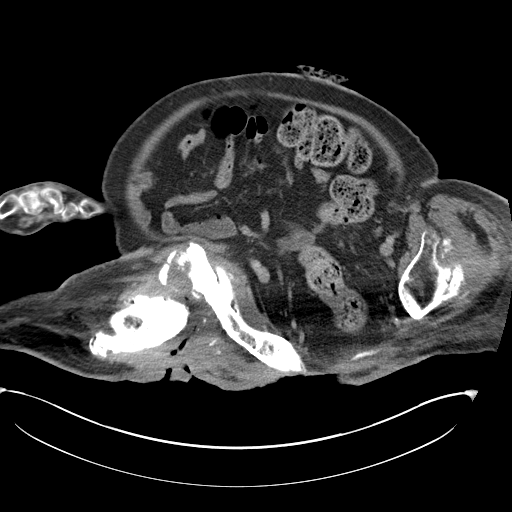
[im 29/101  soft-tissue]
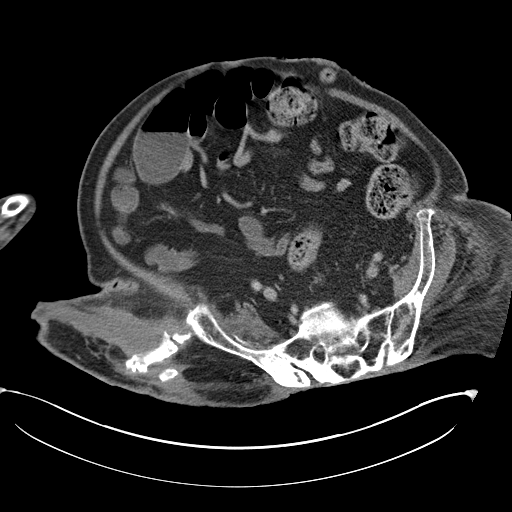
[im 36/101  soft-tissue]
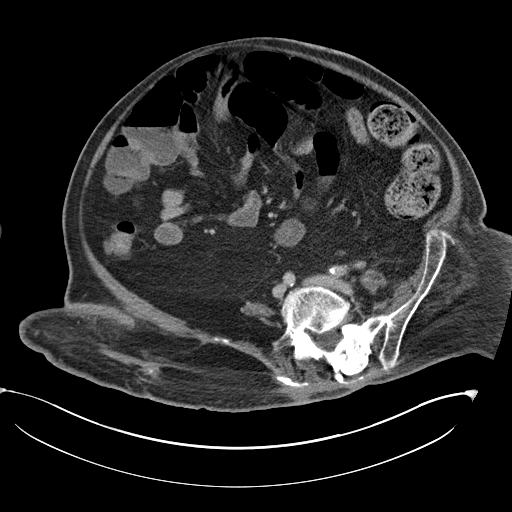
[im 43/101  soft-tissue]
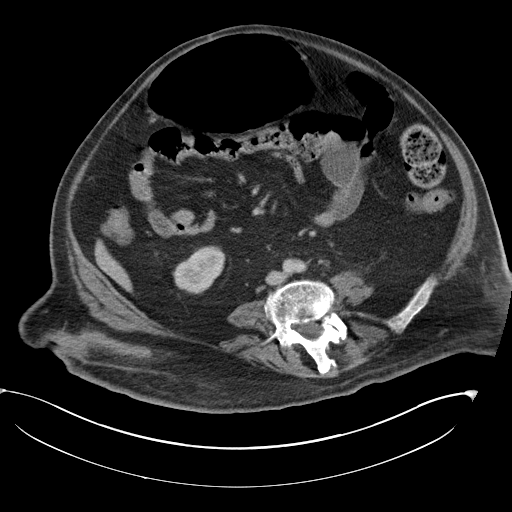
[im 51/101  soft-tissue]
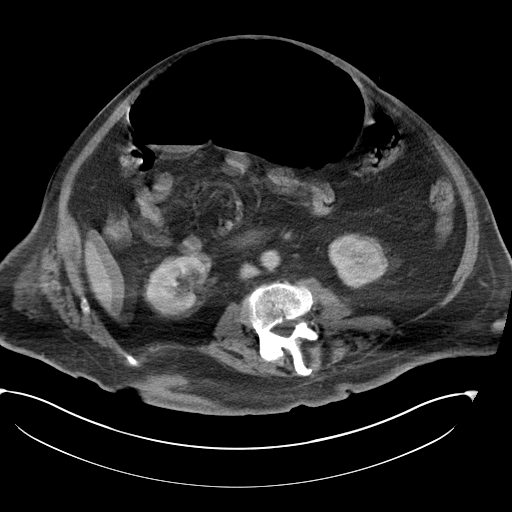
[im 58/101  soft-tissue]
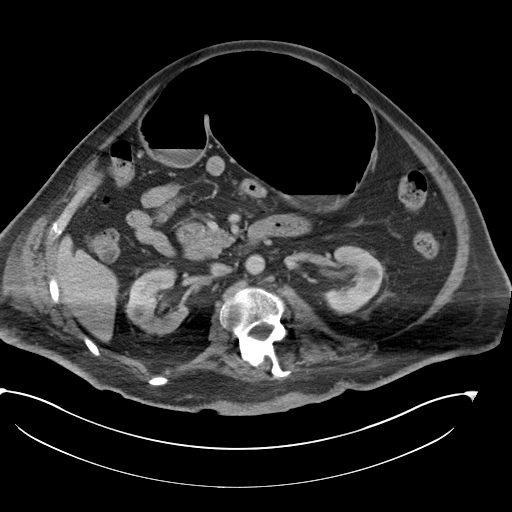
[im 65/101  soft-tissue]
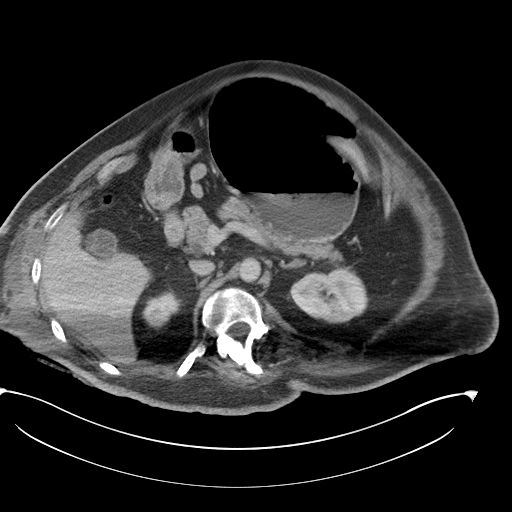
[im 65/101  bone]
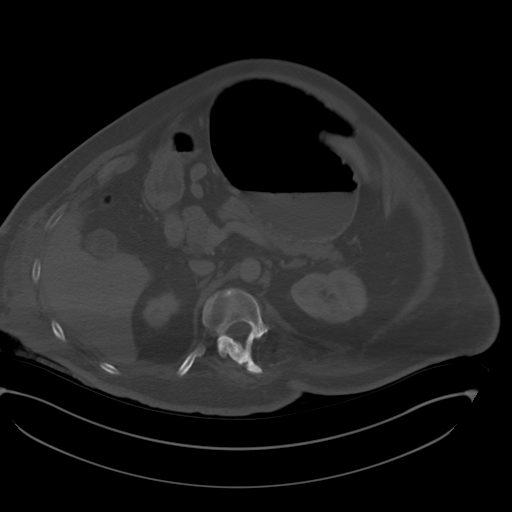
[im 72/101  soft-tissue]
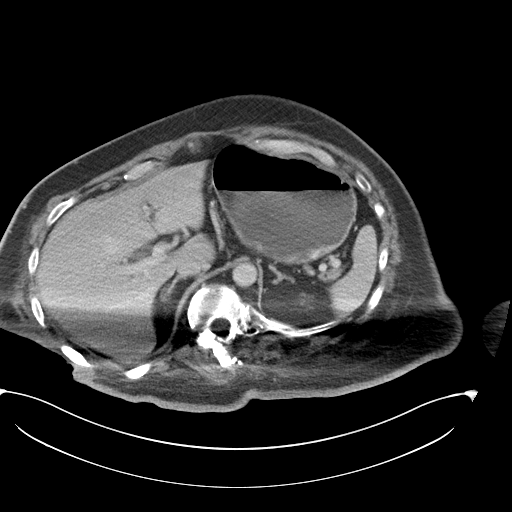
[im 79/101  soft-tissue]
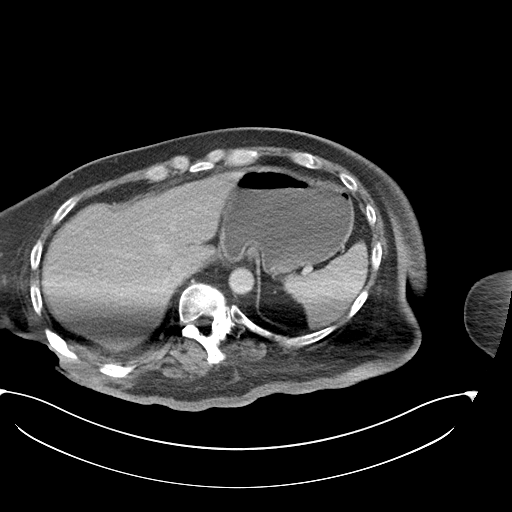
[im 86/101  soft-tissue]
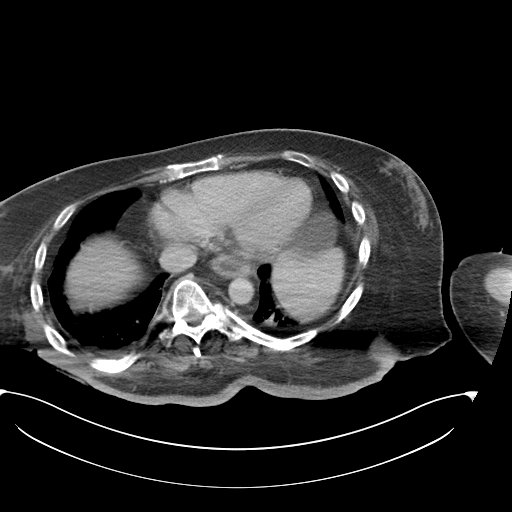
[im 93/101  soft-tissue]
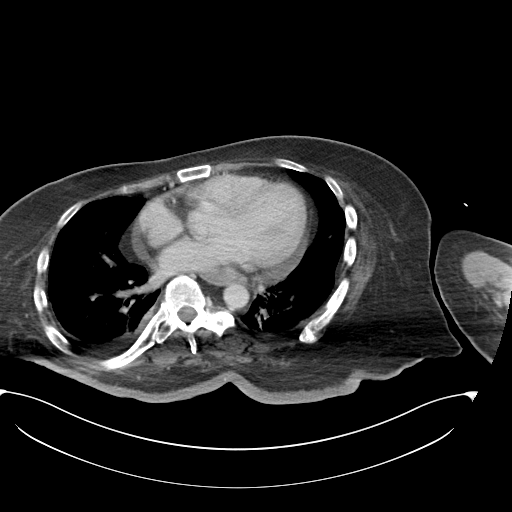

[Series 4: coronal st · coronal · 0.89mm/px · 3 of 108 slices shown]
[im 36/108  soft-tissue]
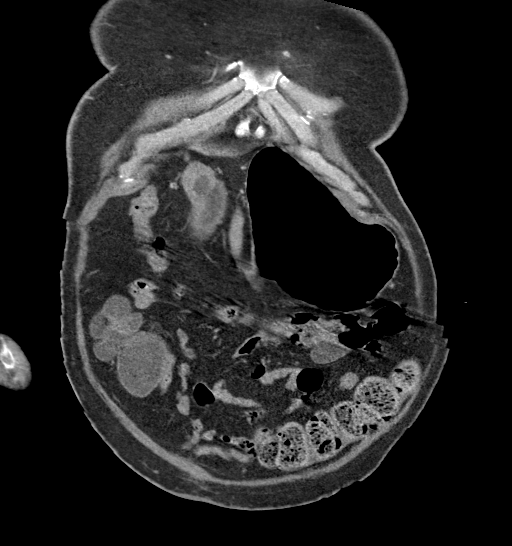
[im 48/108  soft-tissue]
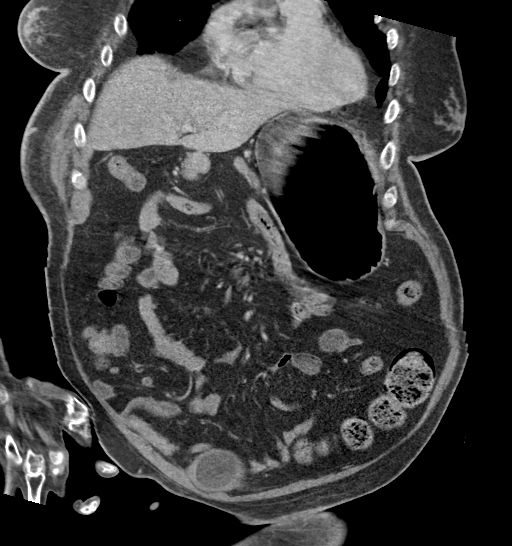
[im 60/108  soft-tissue]
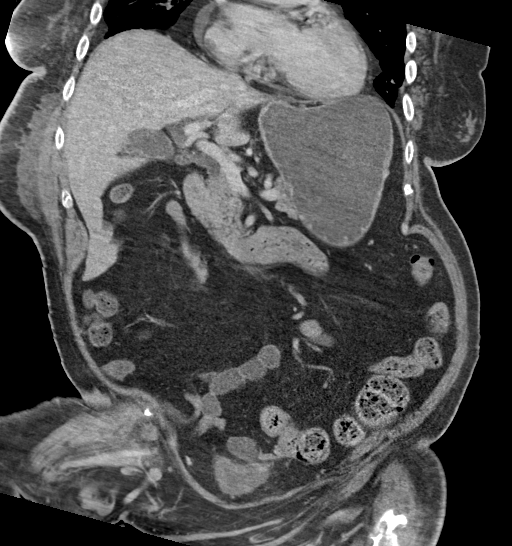

[16 of 46 positions shown; findings below may reference images not displayed]

FINDINGS: Lower chest: No acute abnormality. Mild bibasilar
atelectasis/scarring is unchanged. UPPER limits normal heart size
again noted.

Hepatobiliary: The liver and gallbladder are unremarkable. No
biliary dilatation.

Pancreas: Unremarkable

Spleen: Unremarkable

Adrenals/Urinary Tract: The kidneys and adrenal glands are
unremarkable except for a small LEFT renal cyst. A suprapubic
catheter is present within the bladder.

Stomach/Bowel: Gaseous distention of the stomach is noted which also
contains a small amount of fluid. There is no evidence of small
bowel obstruction, bowel wall thickening or inflammatory changes. A
LEFT LOWER quadrant ostomy is again noted.

Vascular/Lymphatic: No significant vascular findings are present.
Unchanged mildly prominent pelvic, abdominal and retroperitoneal
lymph nodes are likely reactive.

Reproductive: No significant change

Other: No ascites, abscess or pneumoperitoneum.

Musculoskeletal: Severe osseous destruction of both hips and chronic
dislocation of the RIGHT hip is unchanged. Abnormal RIGHT hip soft
tissue thickening with connection to the skin and sclerosis of the
RIGHT hemipelvis again noted. Sacral decubitus ulcer again
identified.
IMPRESSION: 1. Gastric distension, primarily with gas, without obstructing
cause. This may be related to gastroparesis or gastroenteritis. No
evidence of small bowel obstruction.
2. No other acute abnormalities.
3. Severe chronic changes within both hips with RIGHT hip and sacral
ulcers. RIGHT hip and RIGHT bony pelvis changes may represent
chronic osteomyelitis.

## 2020-07-04 IMAGING — DX DG CHEST 1V PORT
2 series · 2 of 2 positions shown · non-contrast
Comparison: 11/17/2017 chest x-ray.  11/18/2017 CT.

CLINICAL DATA: 52-year-old male with fever for 4 days. Subsequent
encounter.

EXAM:
PORTABLE CHEST 1 VIEW

[chest ap (1 of 2)]
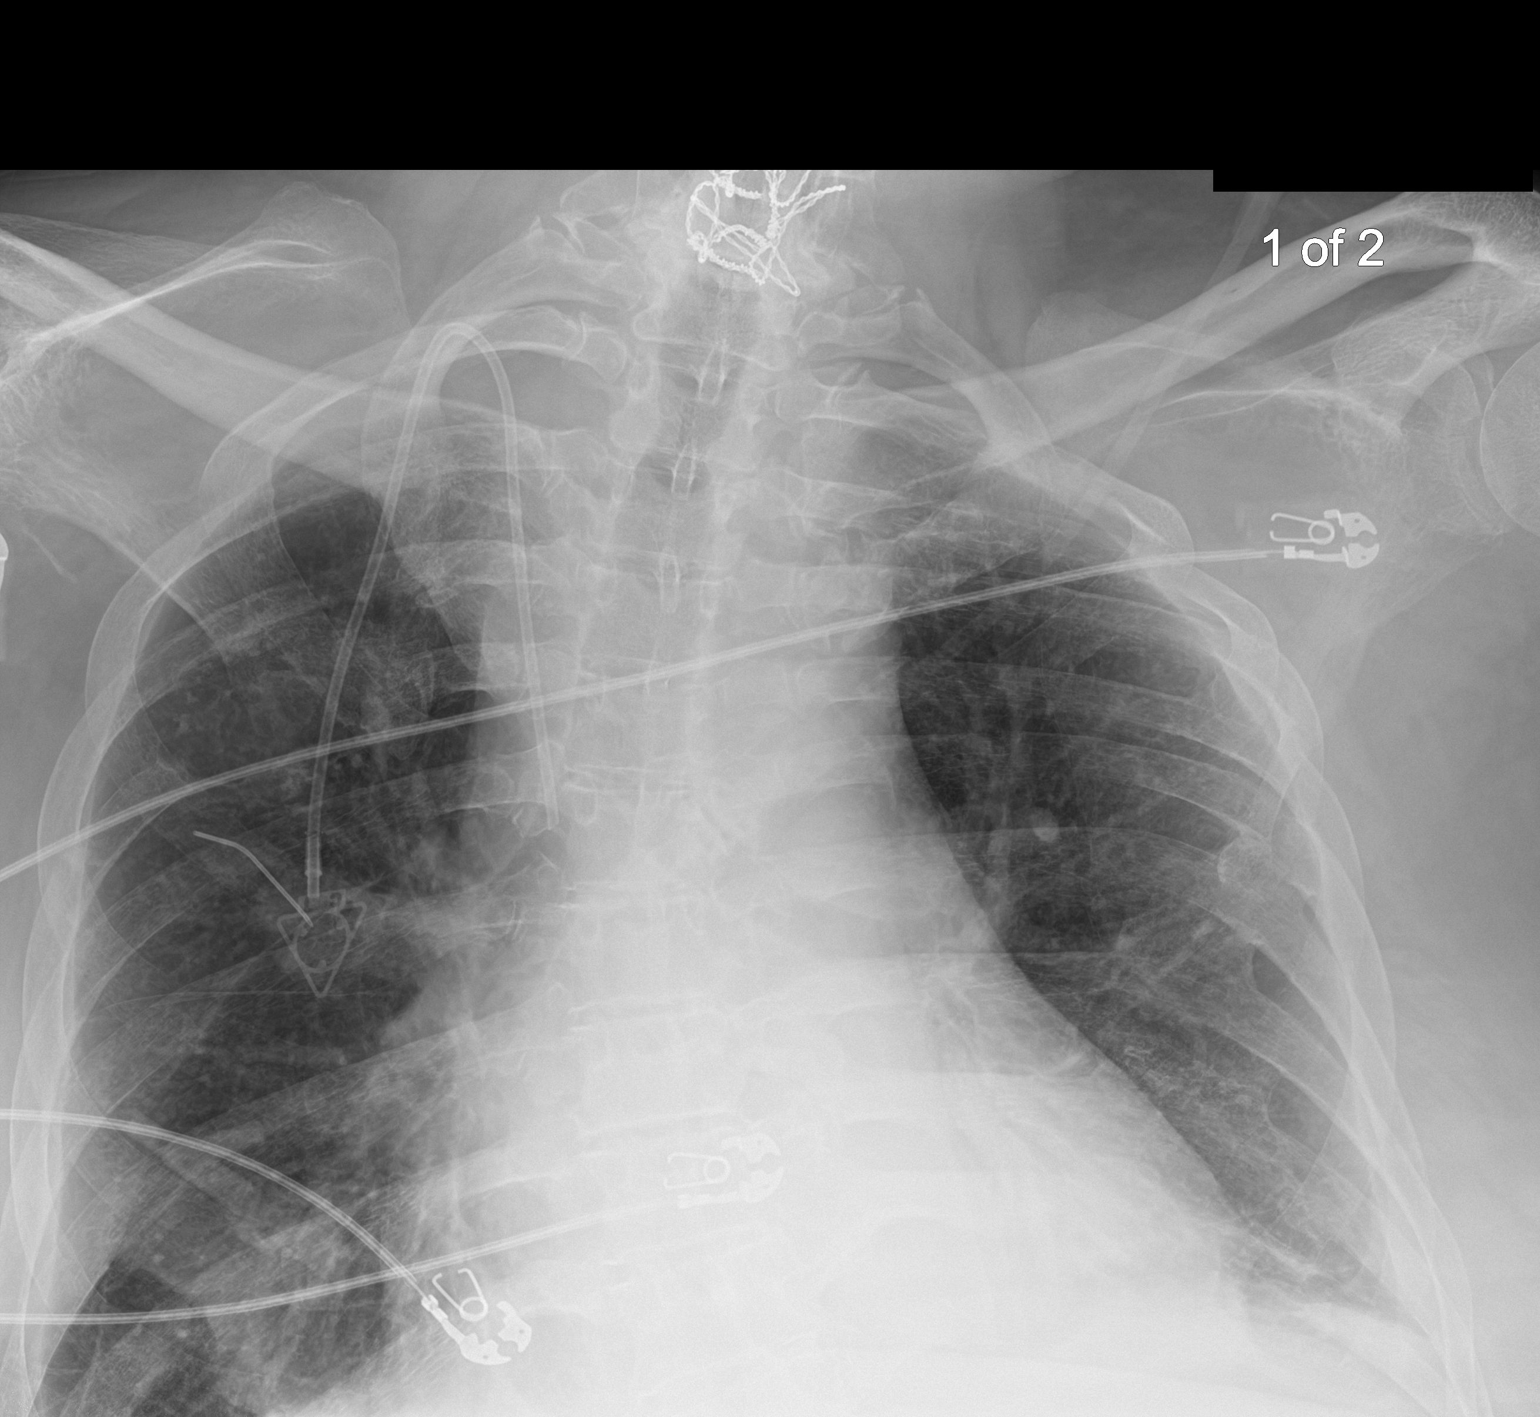

[chest ap (2 of 2)]
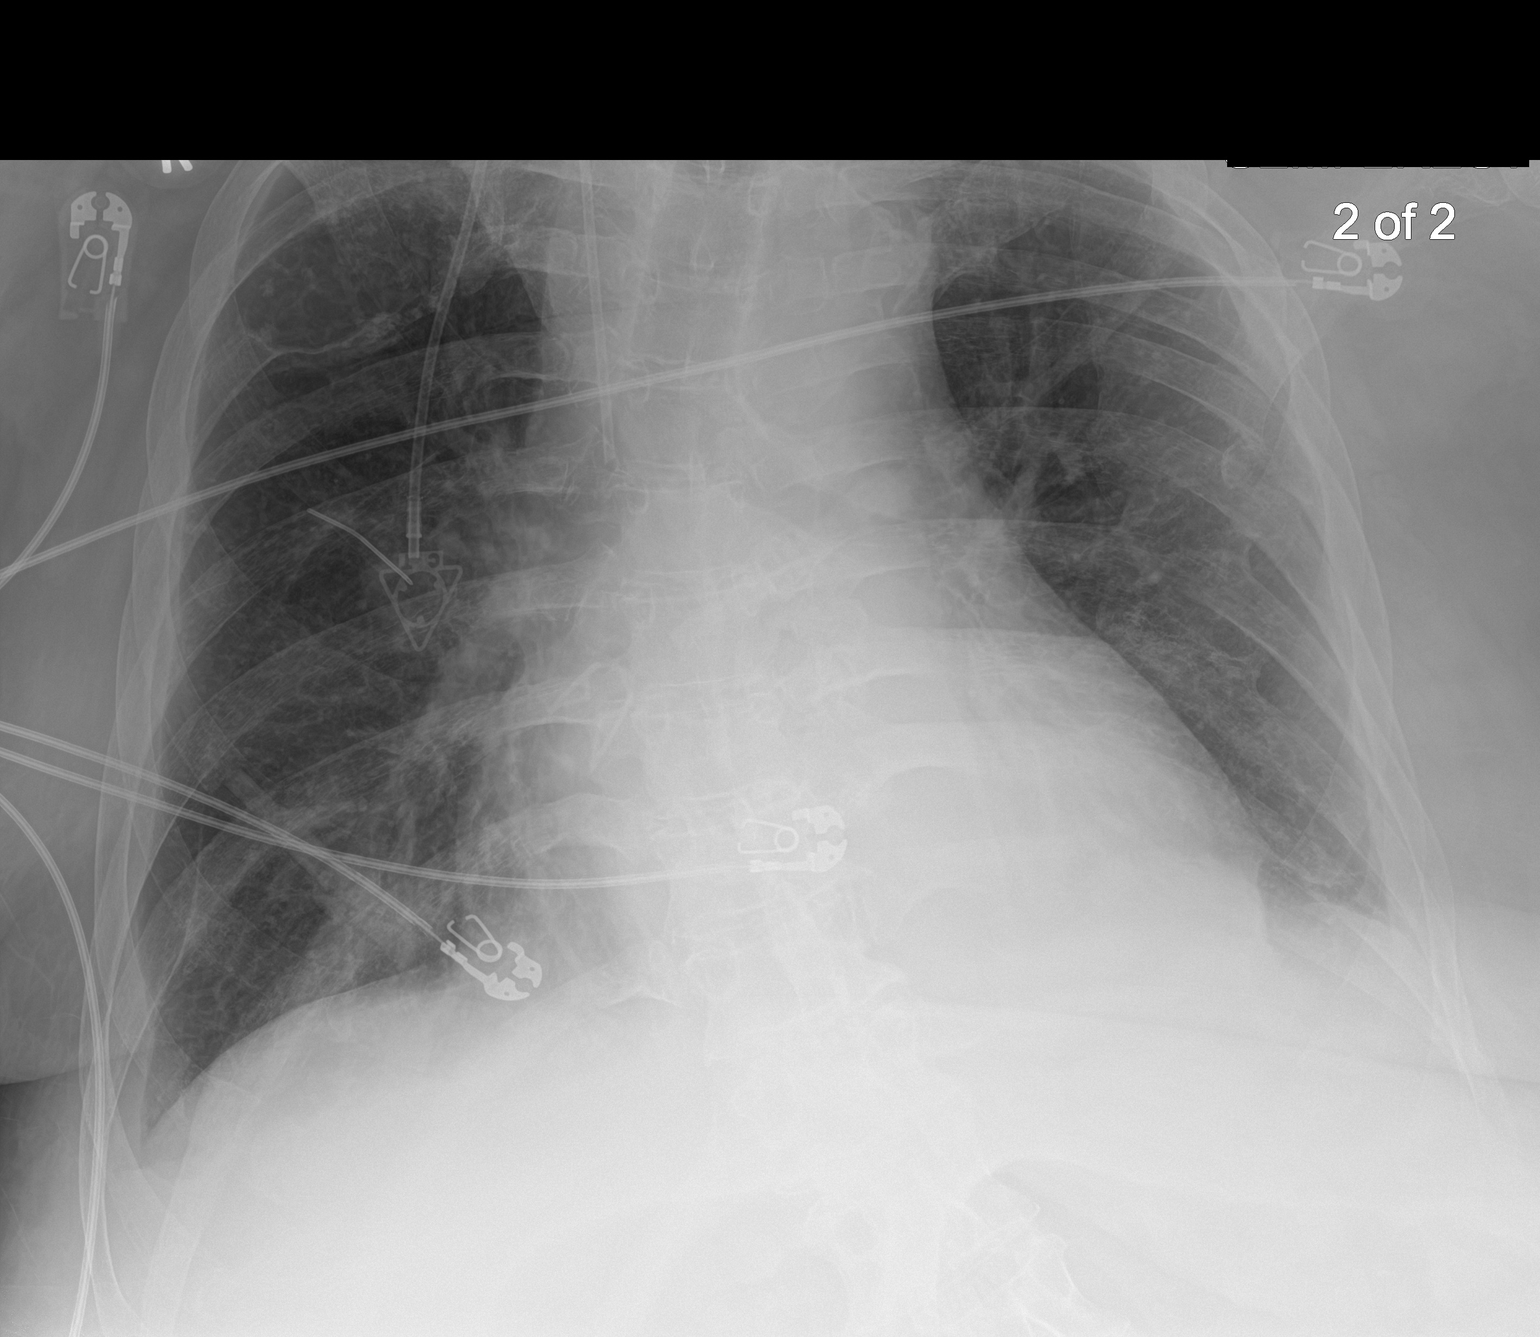

[2 of 2 positions shown; findings below may reference images not displayed]

FINDINGS: Cardiomegaly. Central pulmonary vascular prominence without
pulmonary edema.

Right central line tip mid to distal superior vena cava level.

Bibasilar subsegmental atelectasis. Subtle infiltrate secondary
consideration. Findings similar to prior CT.

Expansile destructive lesion posterior right fifth rib.

Calcified aorta.

Postsurgical changes lower cervical spine.
IMPRESSION: Bibasilar subsegmental atelectasis. Subtle infiltrate secondary
consideration. Findings similar to prior CT.

Expansile destructive lesion posterior right fifth rib.

Cardiomegaly.

Aortic Atherosclerosis ([id number]).

## 2020-11-15 IMAGING — CT CT ABD-PELV W/ CM
2 of 5 series · 16 of 46 positions shown, 18 images · IV contrast (ISOVUE)
Comparison: 02/05/2018

CLINICAL DATA: Acute generalized abdominal pain, LEFT side
abdominal pain since 4644 hours yesterday associated with nausea but
no vomiting, history of diverting colostomy, cholecystectomy, GERD,
ulcer disease, hypertension, quadriplegia with decubitus ulcers

EXAM:
CT ABDOMEN AND PELVIS WITH CONTRAST
TECHNIQUE: Multidetector CT imaging of the abdomen and pelvis was performed
using the standard protocol following bolus administration of
intravenous contrast. Sagittal and coronal MPR images reconstructed
from axial data set.
CONTRAST:  100mL [id number] IOPAMIDOL (AVF08T-RYY) INJECTION 61% IV.
No oral contrast.

[Series 2: axial st · axial · 0.96mm/px · z∈[+659,+1054]mm · 13 of 91 slices shown, 15 images]
[im 6/91  soft-tissue]
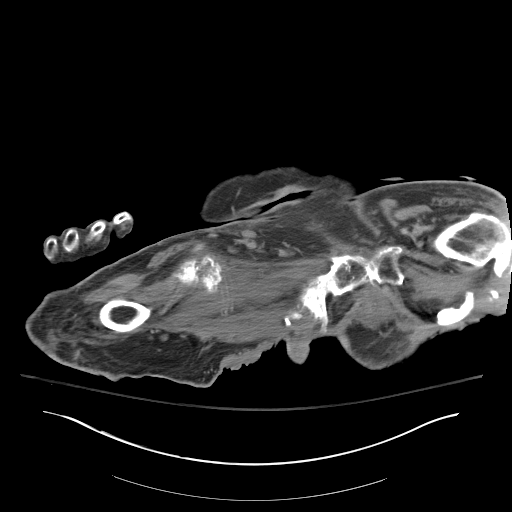
[im 6/91  bone]
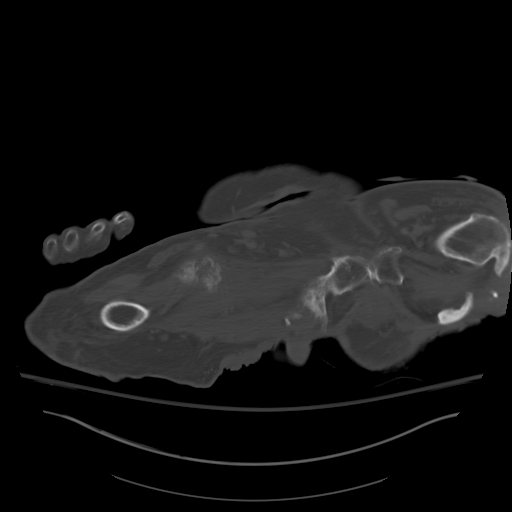
[im 12/91  soft-tissue]
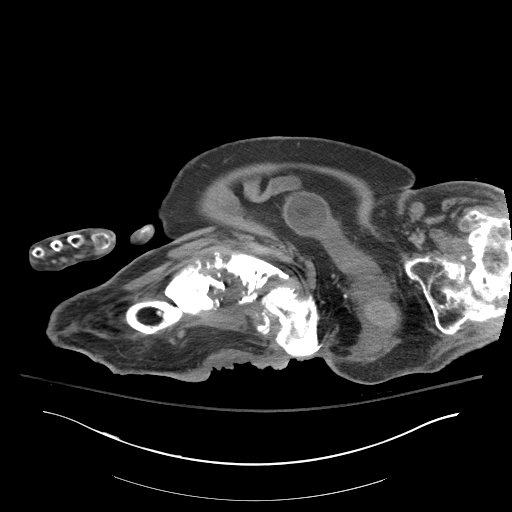
[im 17/91  soft-tissue]
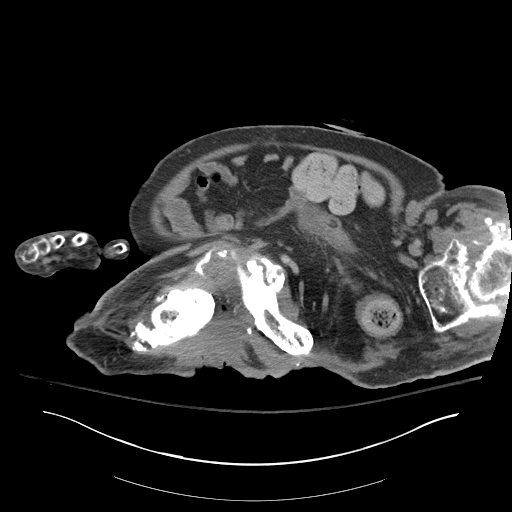
[im 29/91  soft-tissue]
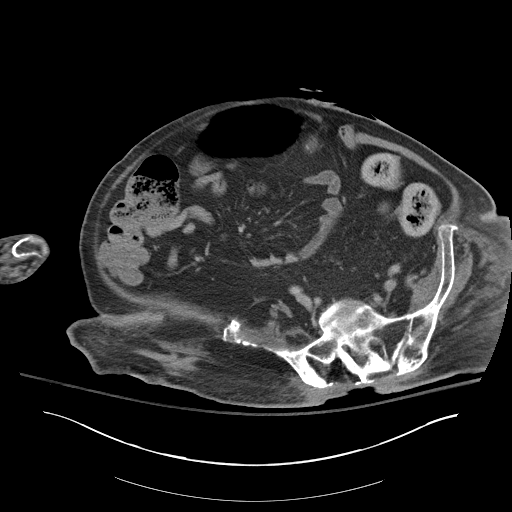
[im 34/91  soft-tissue]
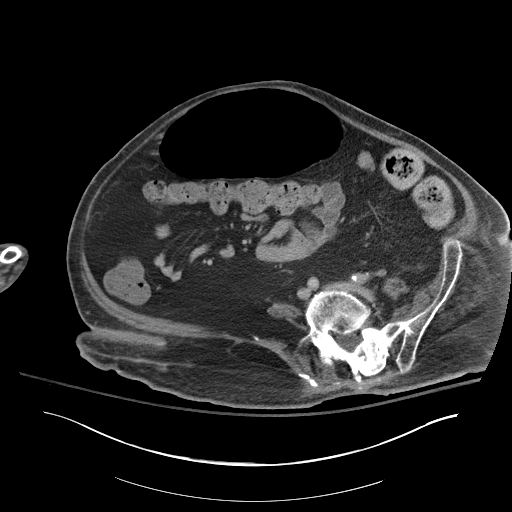
[im 40/91  soft-tissue]
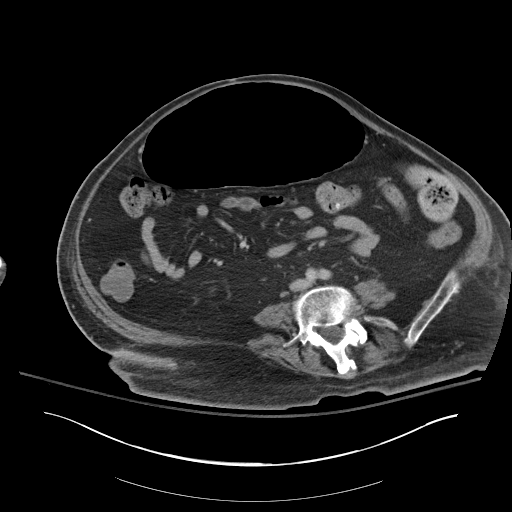
[im 46/91  soft-tissue]
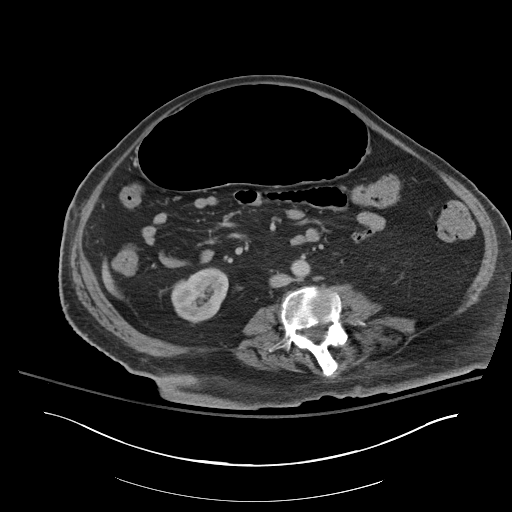
[im 51/91  soft-tissue]
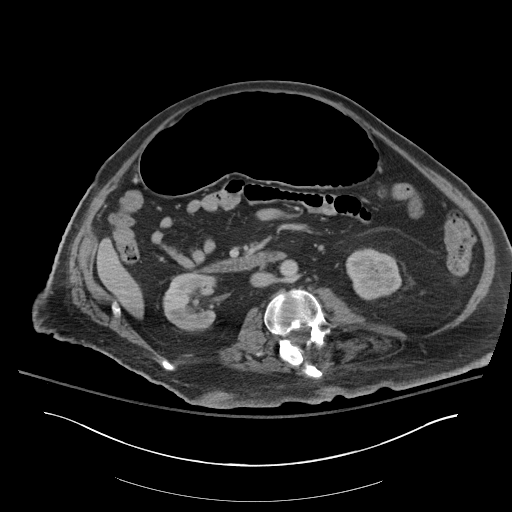
[im 57/91  soft-tissue]
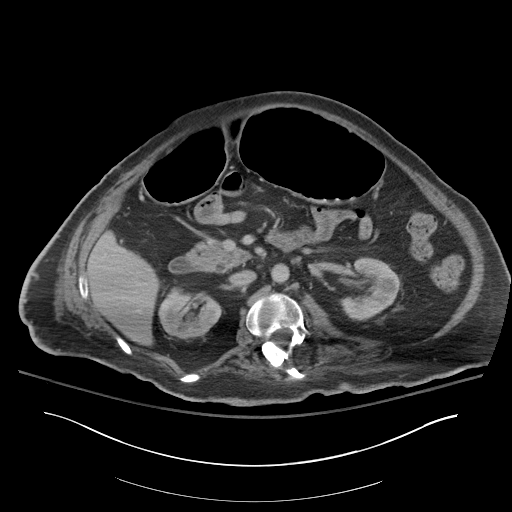
[im 57/91  bone]
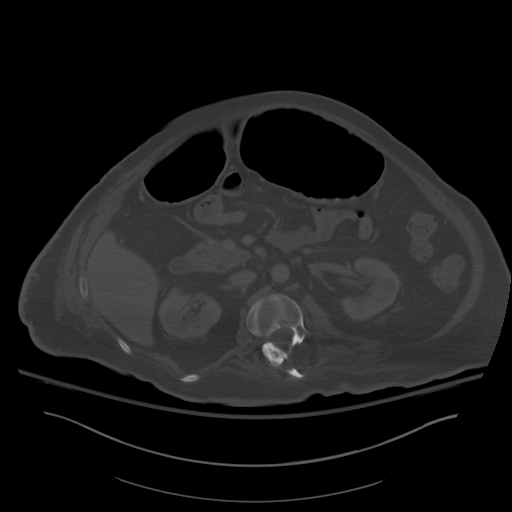
[im 62/91  soft-tissue]
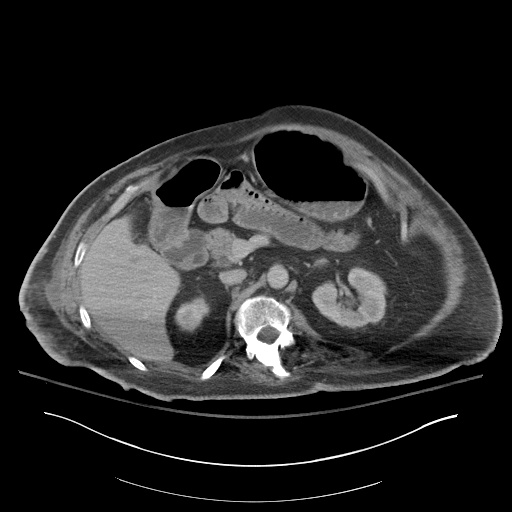
[im 74/91  soft-tissue]
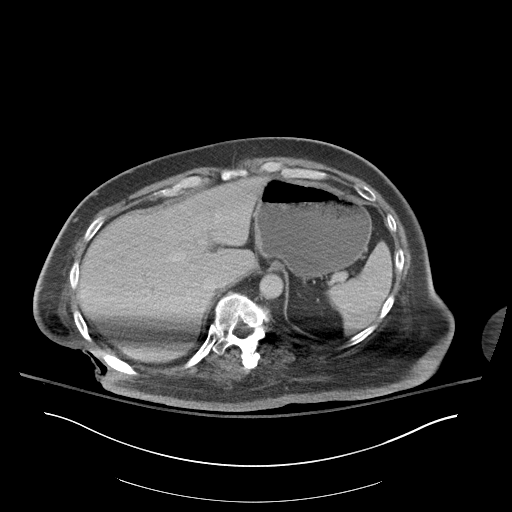
[im 79/91  soft-tissue]
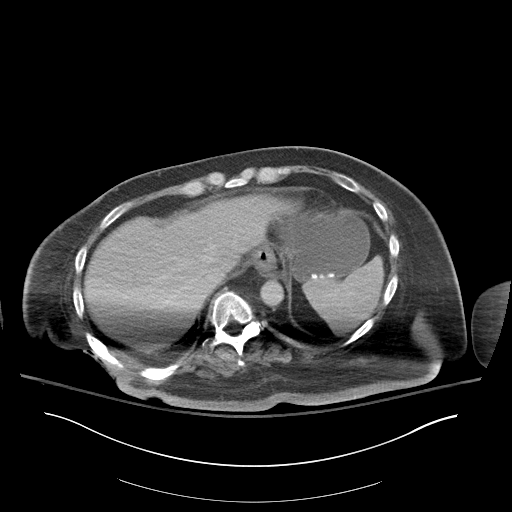
[im 85/91  soft-tissue]
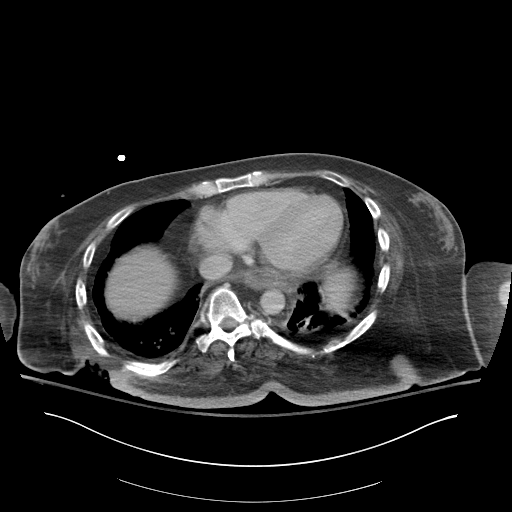

[Series 6: coronal st · coronal · 0.90mm/px · 3 of 120 slices shown]
[im 53/120  soft-tissue]
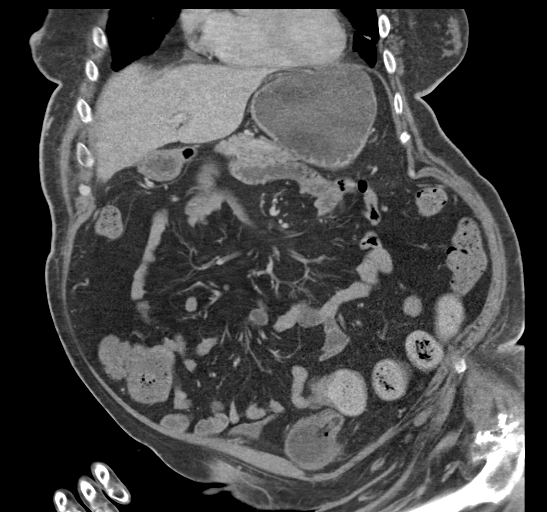
[im 67/120  soft-tissue]
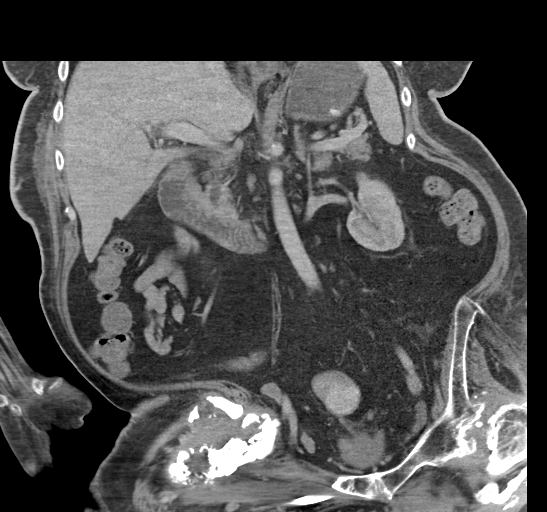
[im 80/120  soft-tissue]
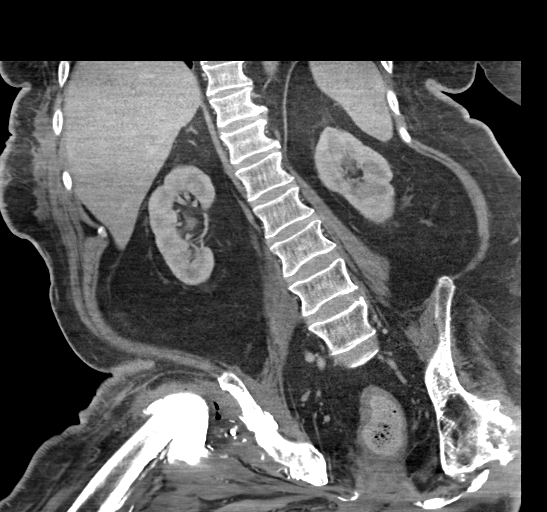

[16 of 46 positions shown; findings below may reference images not displayed]

FINDINGS: Lower chest: Bibasilar atelectasis. Basilar peribronchial thickening

Hepatobiliary: Gallbladder surgically absent. No focal hepatic
abnormalities. No biliary dilatation. Portal fluid collection seen
on prior exam resolved.

Pancreas: Normal appearance

Spleen: Normal appearance

Adrenals/Urinary Tract: Adrenal glands normal appearance. Renal
cortical thinning. Beam hardening artifacts traverse kidneys. 11 x
13 mm LEFT renal lesion indeterminate attenuation but unchanged
since an earlier exam of 02/05/2017. No additional renal mass
lesions. No hydronephrosis or hydroureter. Bladder decompressed by
suprapubic catheter.

Stomach/Bowel: Gaseous distention of stomach. Small bowel
decompressed, unremarkable. Question minimal rectal wall thickening
versus artifact from underdistention. Diverting colostomy with
[place] Halit. Bowel loops otherwise unremarkable. Normal appendix.

Vascular/Lymphatic: Atherosclerotic calcifications iliac arteries.
Aorta normal caliber. Retroaortic LEFT renal vein. Enlarged LEFT
mm. Mildly enlarged LEFT internal iliac lymph node 14 mm short axis
image 77 previously 10 mm. 14 mm RIGHT external iliac node image 70
unchanged.

Reproductive: Unremarkable prostate gland

Other: No free air or free fluid.  No hernia.

Musculoskeletal: Marked bone destruction involving BILATERAL hip
joints question due to septic arthritis. Bones demineralized. Areas
of sclerosis identified at the RIGHT ischium, RIGHT posterior iliac
bone, anterior RIGHT iliac bone, proximal RIGHT femur likely related
to prior decubiti and chronic osteomyelitis. Additional decubitus
cubitus ulcers are seen overlying the RIGHT sacrum, posterior RIGHT
hip joint with gas extending into the joint, dorsal to the LEFT
ischium, and posterolateral lower RIGHT chest wall. Significant skin
thickening/infiltration inferior RIGHT buttock.
IMPRESSION: No acute intra-abdominal or intrapelvic process.

Multiple decubitus ulcers as above with chronic osseous changes
likely reflecting chronic osteomyelitis as well as chronic
destruction of the hip joints likely due to chronic septic arthritis
especially on RIGHT with gas within the RIGHT hip joint.

Slight increase in size of LEFT pelvic lymph nodes since prior
study.

## 2020-11-24 IMAGING — CR DG CHEST 2V
3 series · 3 of 3 positions shown · non-contrast
Comparison: 02/05/2018 and chest CT from 12/17/2017

CLINICAL DATA: Fever and cough. Acute respiratory failure.
Quadriplegia.

EXAM:
CHEST - 2 VIEW

[w chest lat (1 of 2)]
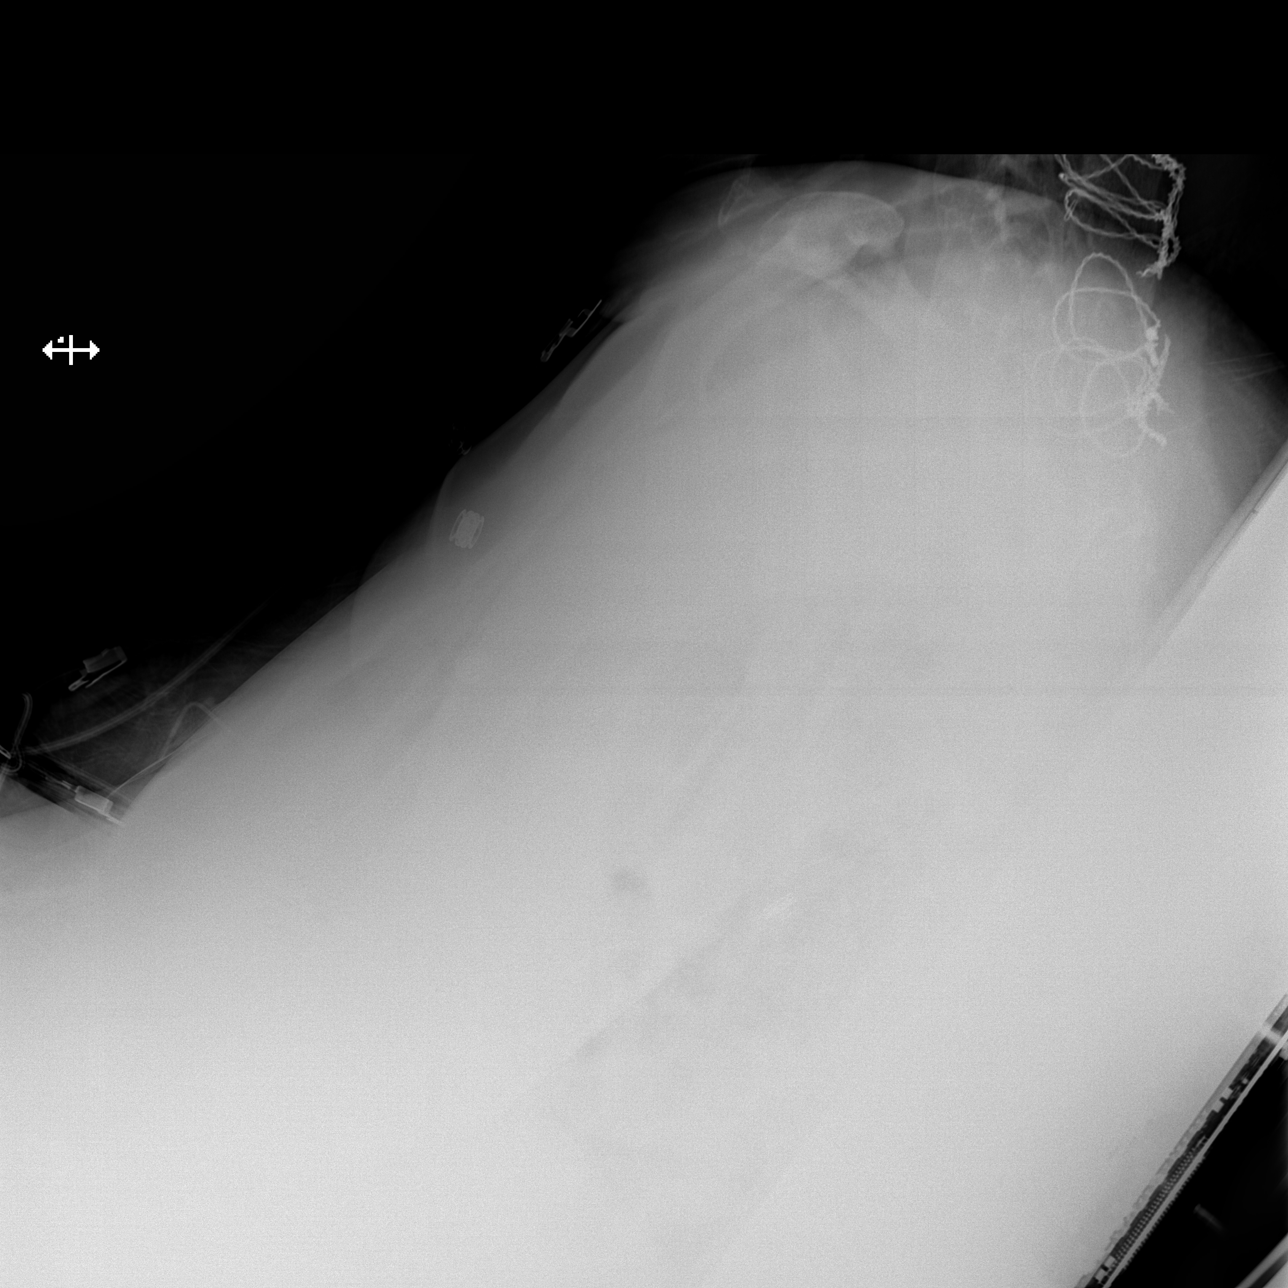

[w chest lat (2 of 2)]
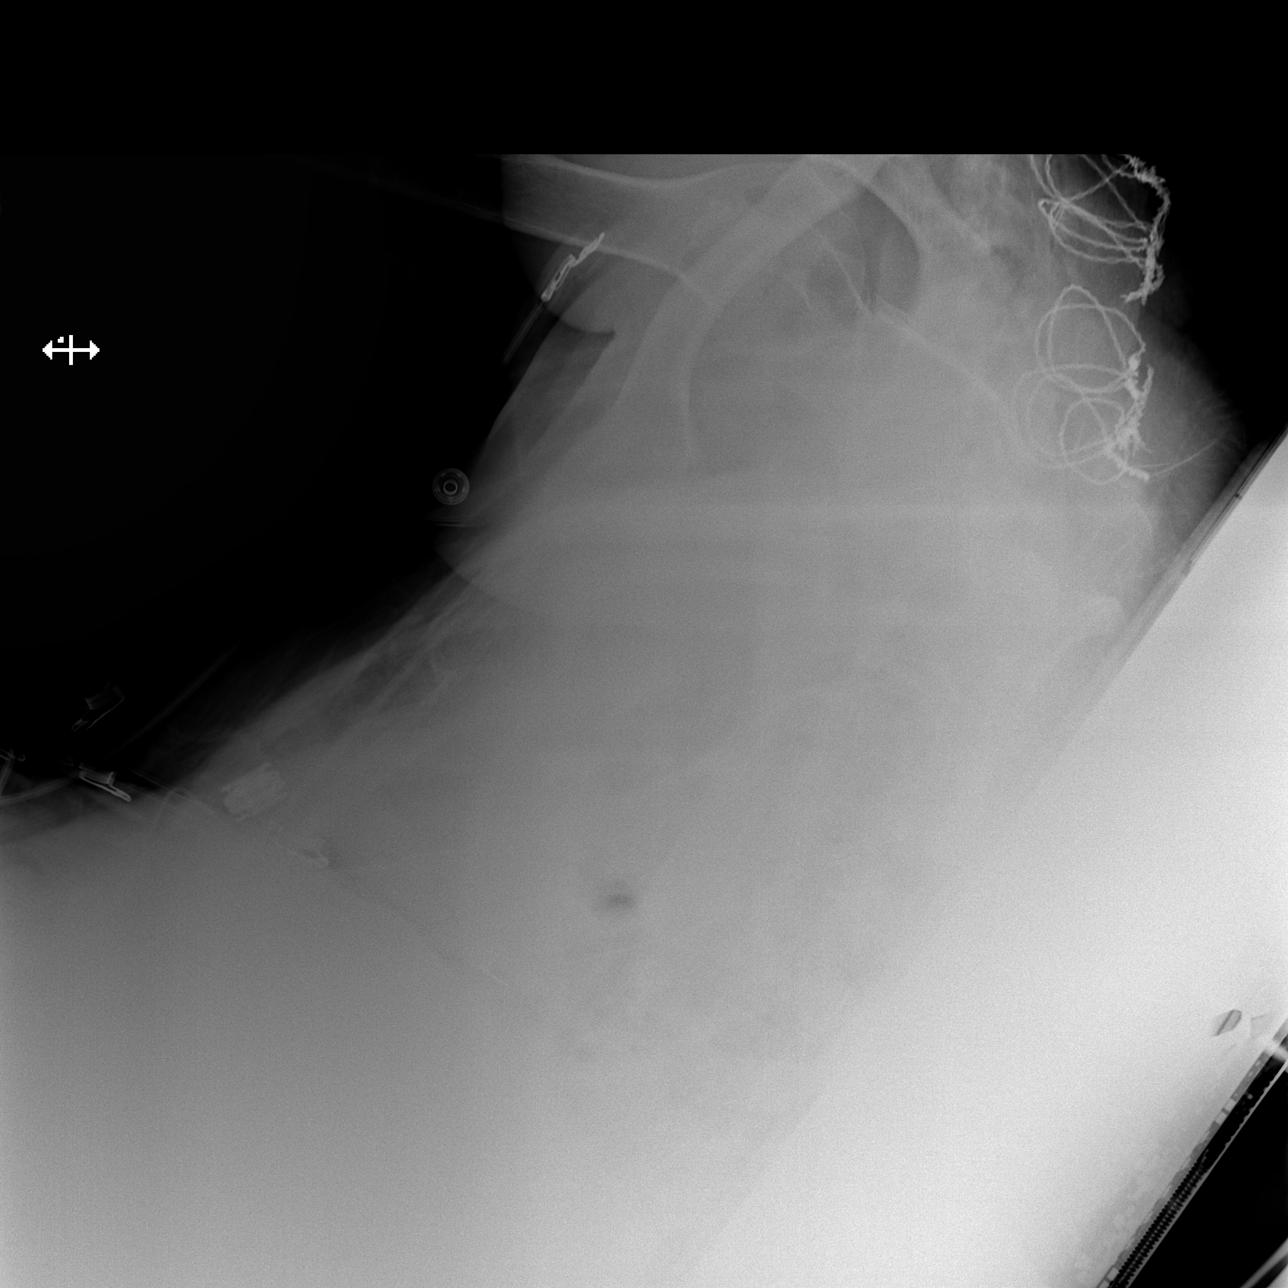

[x chest ap]
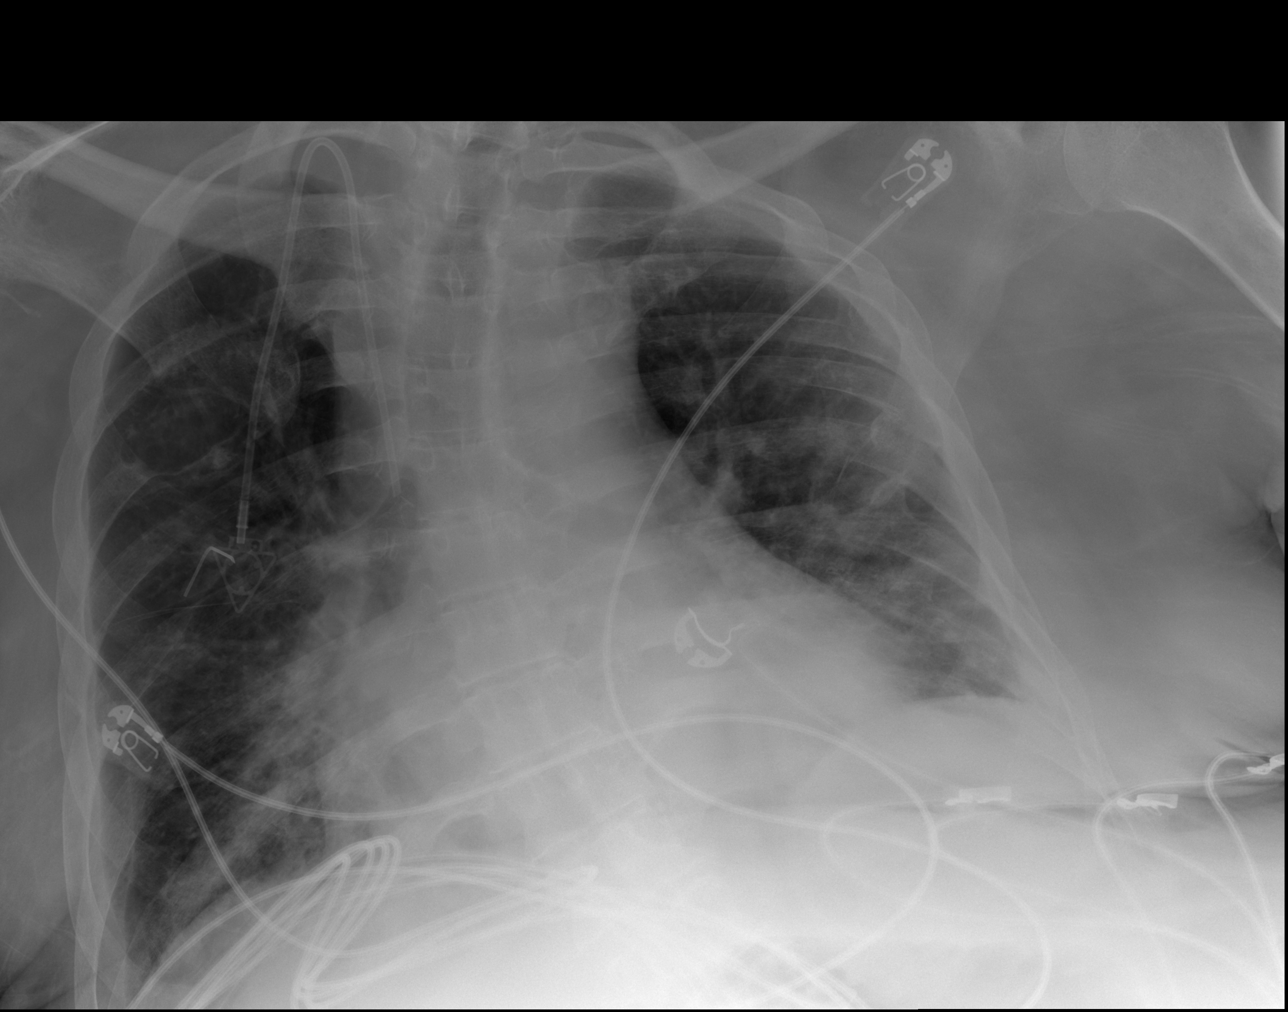

[3 of 3 positions shown; findings below may reference images not displayed]

FINDINGS: Stable position of the right jugular Port-A-Cath with the tip in the
SVC region. Again noted is scoliosis in the thoracolumbar spine.
Again noted is a large expansile lesion involving the posterior
right fifth rib. Old fractures and chronic changes in the left ribs.
Cardiac silhouette is enlarged but stable. Slightly increased
densities in the right lower chest and right infrahilar region.
Cannot exclude airspace disease in the right lower lung. Lateral
views are markedly limited. Surgical changes in the cervical spine.
IMPRESSION: Slightly increased densities in the right lower chest. Findings are
nonspecific but could be related to volume loss or developing
infection.

Stable chronic bone changes as described. Again noted is an
expansile lesion of the right fifth rib.

## 2020-12-21 IMAGING — CT CT ABD-PELV W/ CM
2 of 5 series · 15 of 46 positions shown, 17 images · IV contrast (ISOVUE)
Comparison: CT of the abdomen and pelvis performed 04/03/2018

CLINICAL DATA: Acute onset of generalized abdominal pain and
nausea. Leukocytosis.

EXAM:
CT ABDOMEN AND PELVIS WITH CONTRAST
TECHNIQUE: Multidetector CT imaging of the abdomen and pelvis was performed
using the standard protocol following bolus administration of
intravenous contrast.
CONTRAST:  100mL [id number] IOPAMIDOL ([id number]) INJECTION 61%

[Series 2: axial st · axial · 0.88mm/px · z∈[+1086,+1516]mm · 12 of 98 slices shown, 14 images]
[im 6/98  soft-tissue]
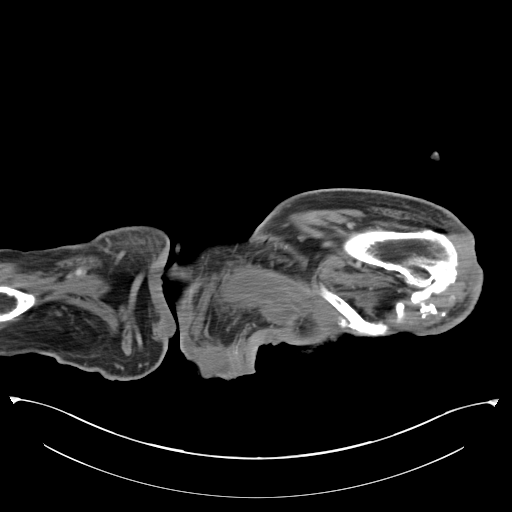
[im 6/98  bone]
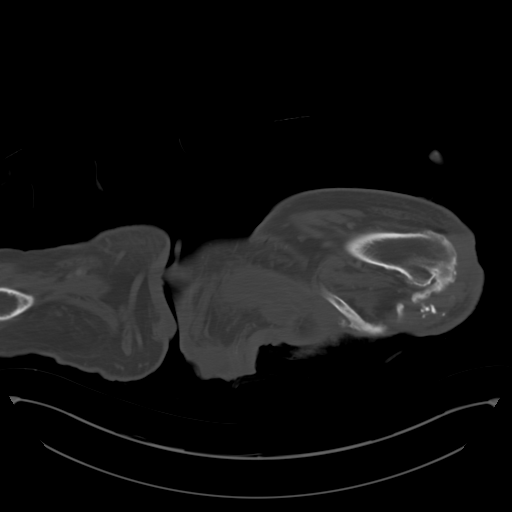
[im 18/98  soft-tissue]
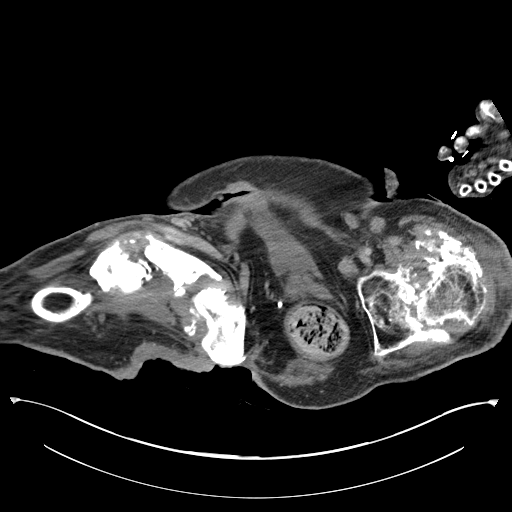
[im 23/98  soft-tissue]
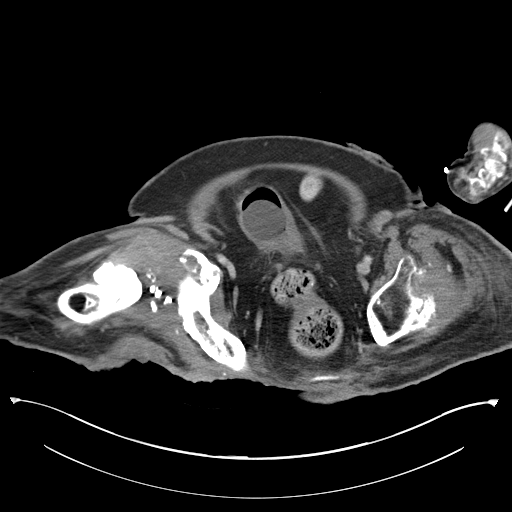
[im 29/98  soft-tissue]
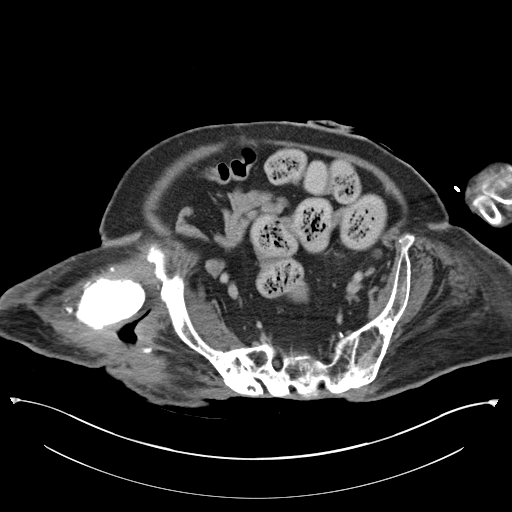
[im 40/98  soft-tissue]
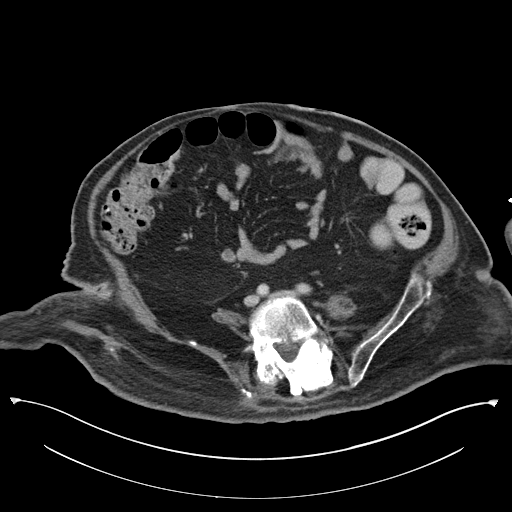
[im 46/98  soft-tissue]
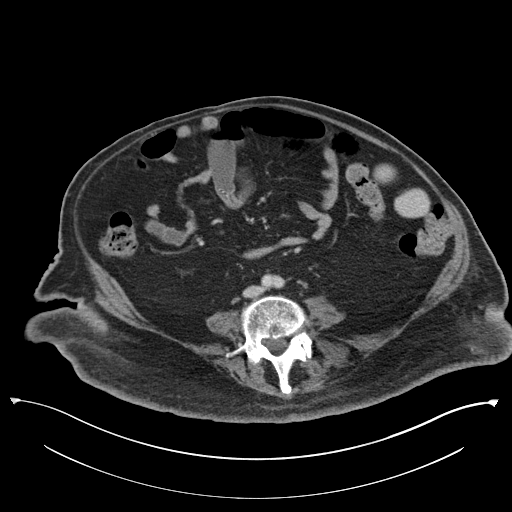
[im 52/98  soft-tissue]
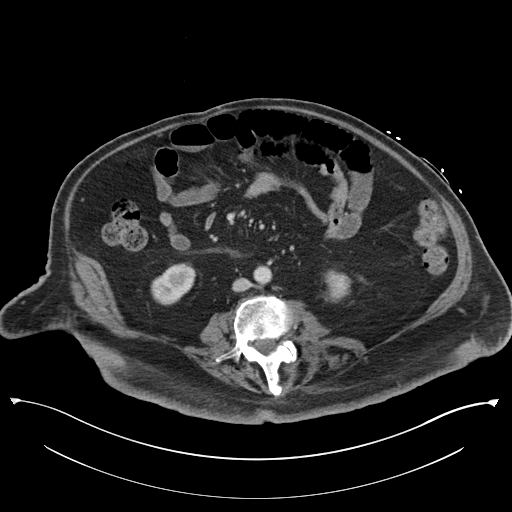
[im 63/98  soft-tissue]
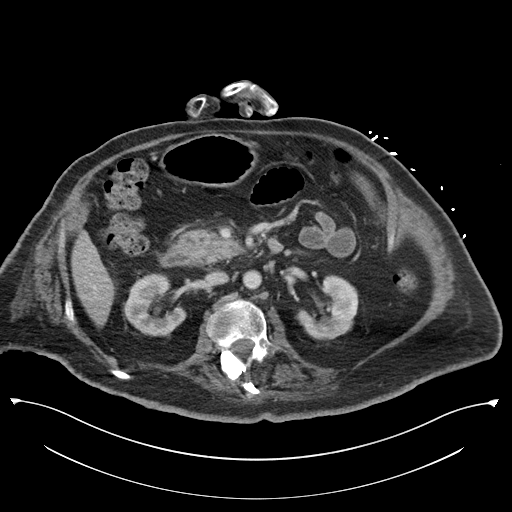
[im 69/98  soft-tissue]
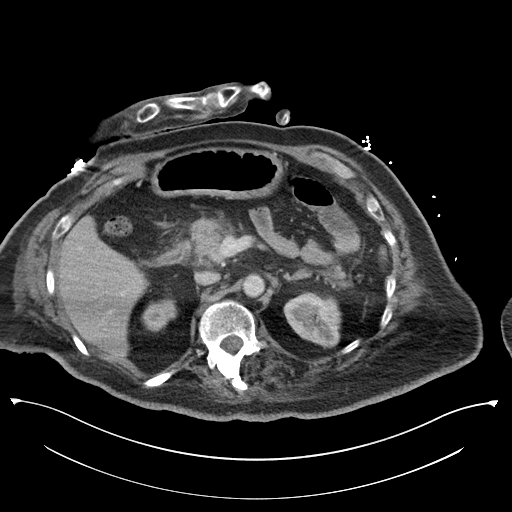
[im 69/98  bone]
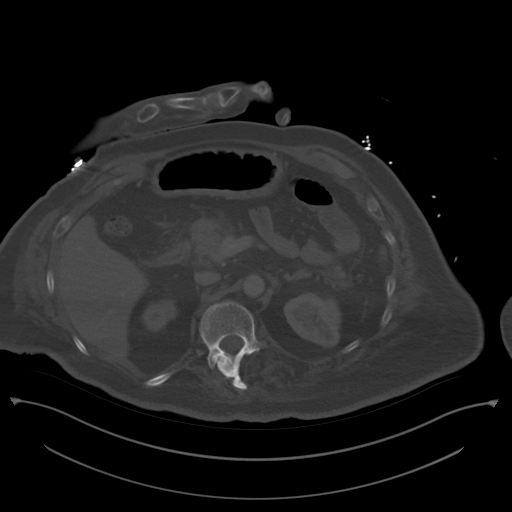
[im 75/98  soft-tissue]
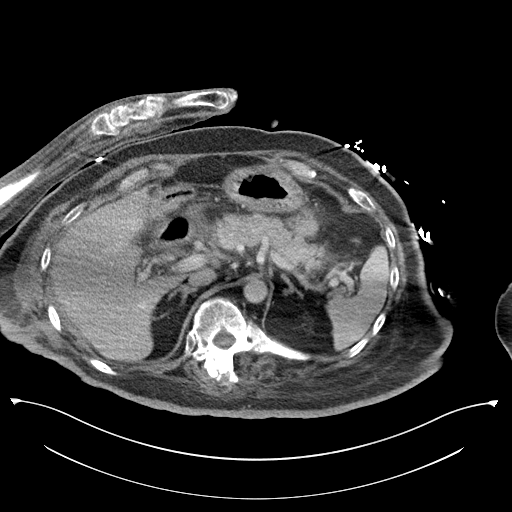
[im 86/98  soft-tissue]
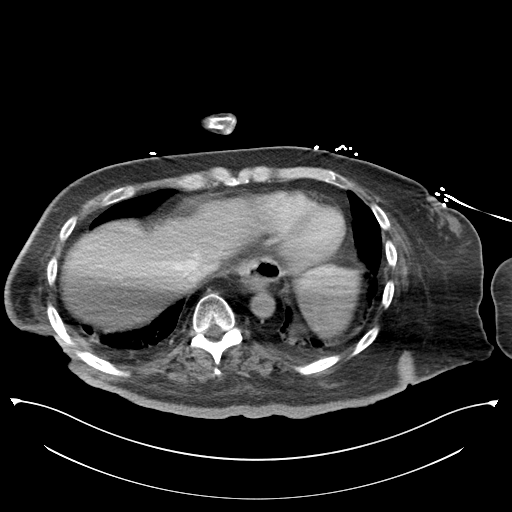
[im 92/98  soft-tissue]
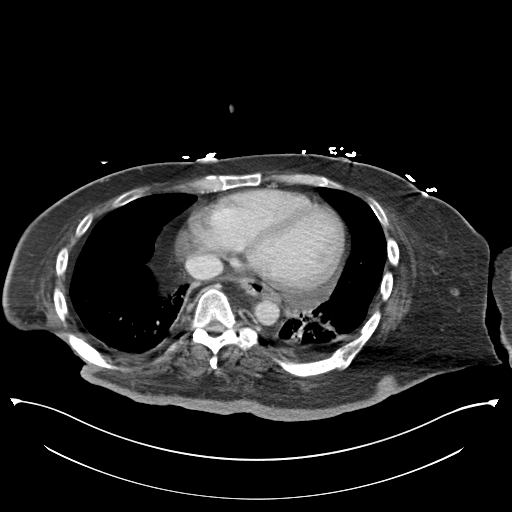

[Series 5: coronal st · coronal · 0.87mm/px · 3 of 97 slices shown]
[im 33/97  soft-tissue]
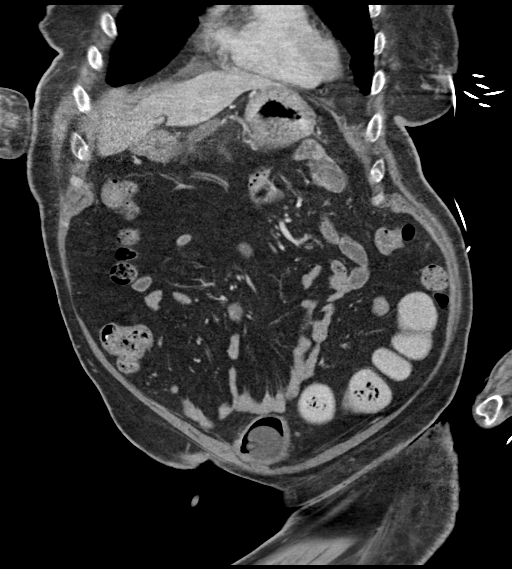
[im 43/97  soft-tissue]
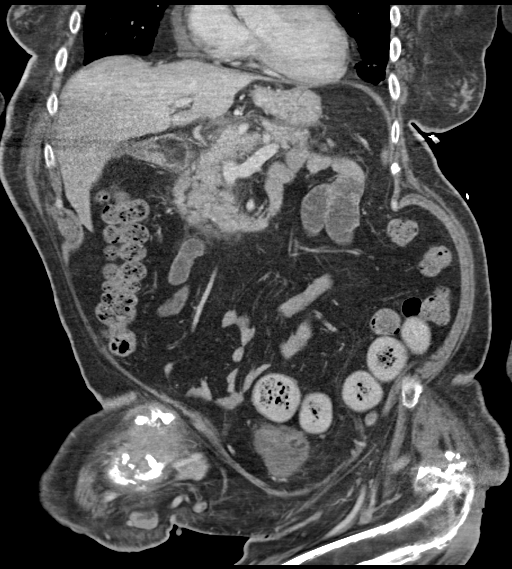
[im 54/97  soft-tissue]
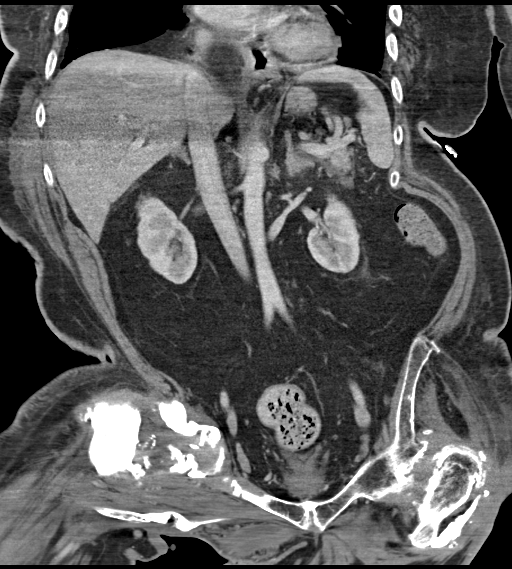

[15 of 46 positions shown; findings below may reference images not displayed]

FINDINGS: Lower chest: Trace bilateral pleural effusions are noted, with
associated atelectasis. Mild wall thickening along the distal
esophagus may reflect mild esophagitis or chronic inflammation.

Hepatobiliary: The liver is unremarkable in appearance. The patient
is status post cholecystectomy, with a clip noted at the gallbladder
fossa. The common bile duct remains normal in caliber.

Pancreas: Vague soft tissue inflammation is noted about the entirety
of the pancreas, most prominent at the head of the pancreas, with
additional soft tissue inflammation tracking about the duodenum.
This is concerning for mild acute pancreatitis. There is no evidence
of devascularization or pseudocyst formation at this time.

Spleen: The spleen is unremarkable in appearance.

Adrenals/Urinary Tract: The adrenal glands are unremarkable in
appearance. The kidneys are within normal limits. There is no
evidence of hydronephrosis. No renal or ureteral stones are
identified. Mild nonspecific perinephric stranding is noted
bilaterally.

Stomach/Bowel: The stomach is unremarkable in appearance. The small
bowel is within normal limits. The appendix is normal in caliber,
without evidence of appendicitis. The colon is unremarkable in
appearance.

Vascular/Lymphatic: The abdominal aorta is unremarkable in
appearance. A retroaortic left renal vein is noted. The inferior
vena cava is grossly unremarkable. No retroperitoneal
lymphadenopathy is seen. No pelvic sidewall lymphadenopathy is
identified.

Reproductive: The bladder is decompressed, with a suprapubic
catheter in place. The prostate remains normal in size.

Other: No additional soft tissue abnormalities are seen.

Musculoskeletal: There is severe chronic resorption and deformity at
the pelvis, involving both hips, with chronic sequelae of
osteomyelitis, and chronic erosion of the coccyx. A large associated
decubitus ulceration is noted extending into the right hip. The
visualized musculature is unremarkable in appearance.
IMPRESSION: 1. Vague soft tissue inflammation about the entirety of the
pancreas, most prominent at the head of the pancreas, with
additional soft tissue inflammation tracking about the duodenum.
This is concerning for mild acute pancreatitis. No evidence of
devascularization or pseudocyst formation at this time.
2. Trace bilateral pleural effusions, with associated atelectasis.
3. Mild wall thickening along the distal esophagus may reflect mild
esophagitis or chronic inflammation.
4. Severe chronic resorption and deformity at the pelvis, involving
both hips, with chronic sequelae of osteomyelitis, and chronic
erosion of the coccyx. Large associated decubitus ulceration
extending into the right hip.

## 2021-05-28 IMAGING — CT CT ABDOMEN AND PELVIS WITHOUT CONTRAST
2 of 4 series · 16 of 46 positions shown, 18 images · non-contrast
Comparison: 09/20/2018

CLINICAL DATA: Abdominal pain, distention, and vomiting beginning
yesterday. Previous bowel obstructions.

EXAM:
CT ABDOMEN AND PELVIS WITHOUT CONTRAST
TECHNIQUE: Multidetector CT imaging of the abdomen and pelvis was performed
following the standard protocol without IV contrast.

[Series 2: axial st · axial · 0.98mm/px · z∈[+1130,+1510]mm · 13 of 86 slices shown, 15 images]
[im 5/86  soft-tissue]
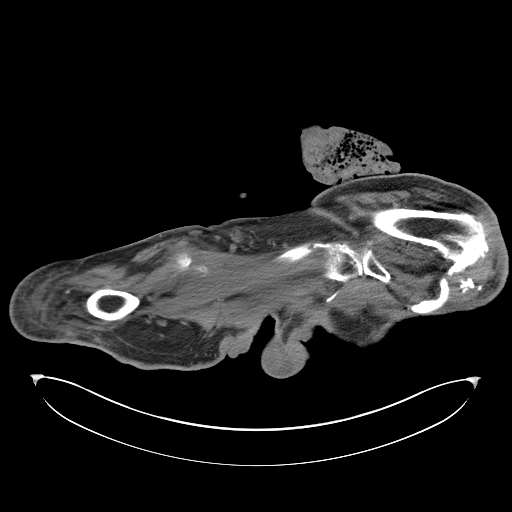
[im 5/86  bone]
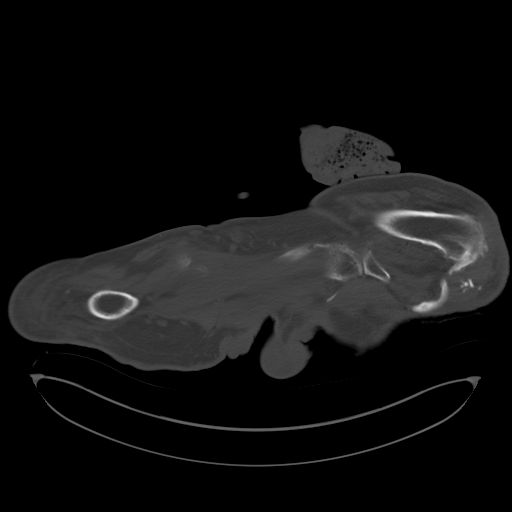
[im 14/86  soft-tissue]
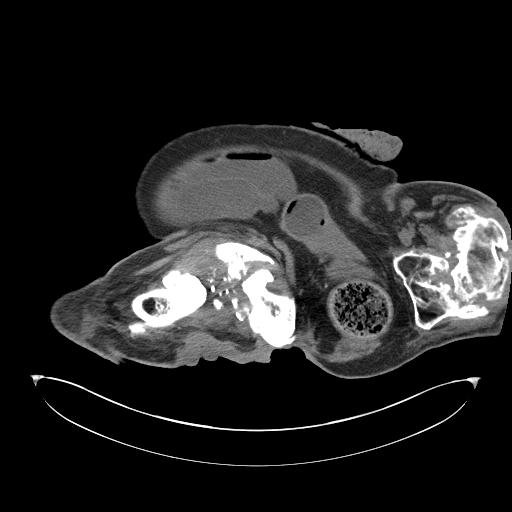
[im 18/86  soft-tissue]
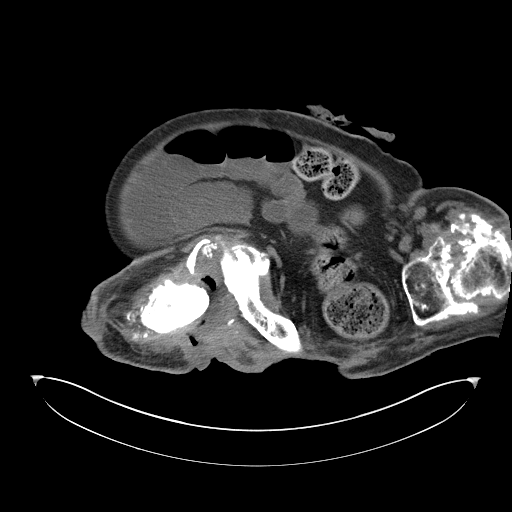
[im 23/86  soft-tissue]
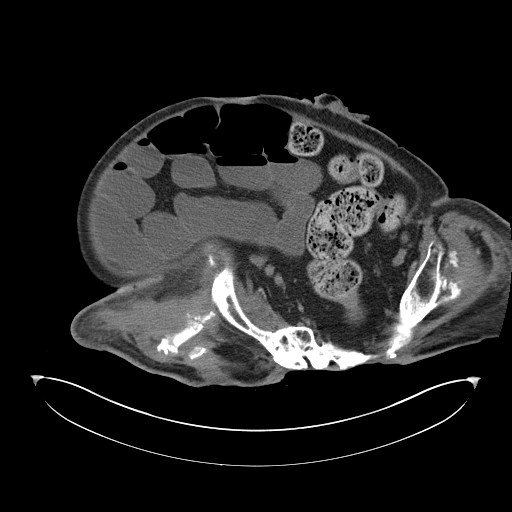
[im 32/86  soft-tissue]
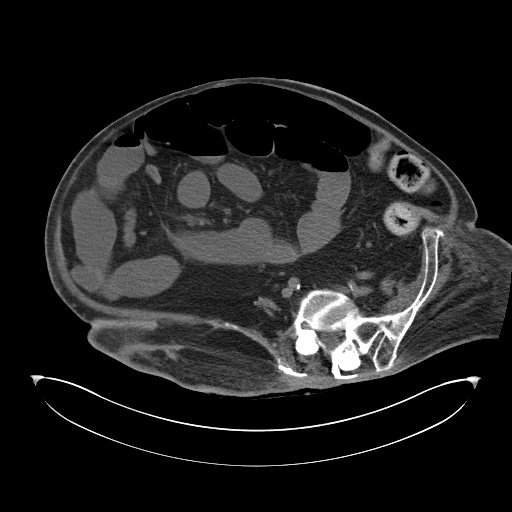
[im 36/86  soft-tissue]
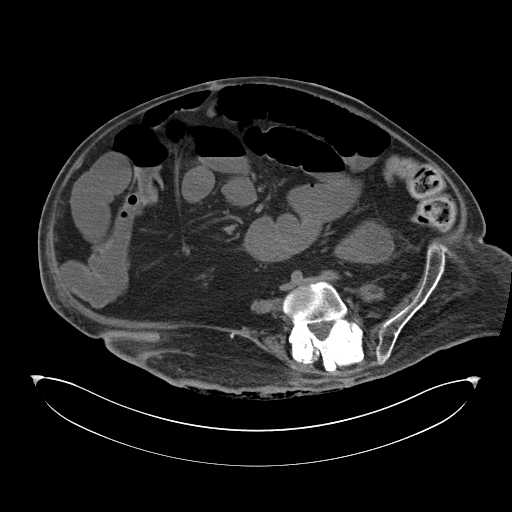
[im 45/86  soft-tissue]
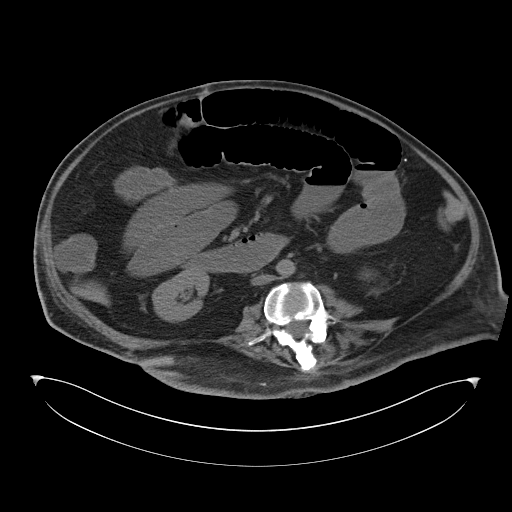
[im 50/86  soft-tissue]
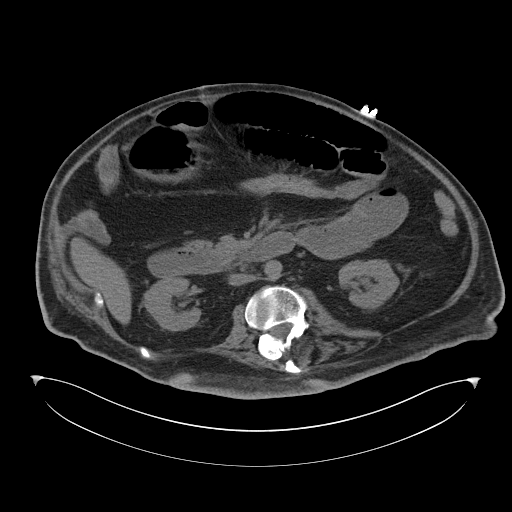
[im 54/86  soft-tissue]
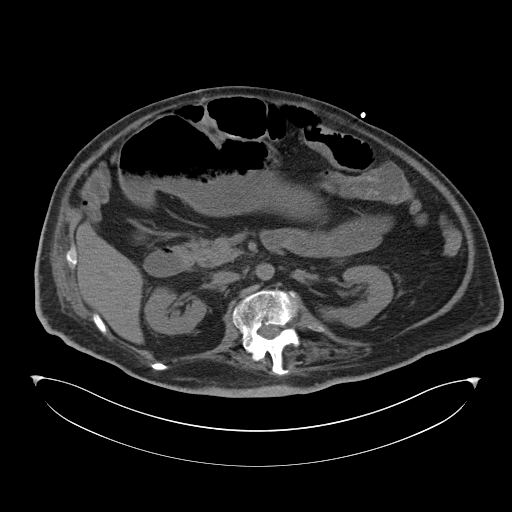
[im 54/86  bone]
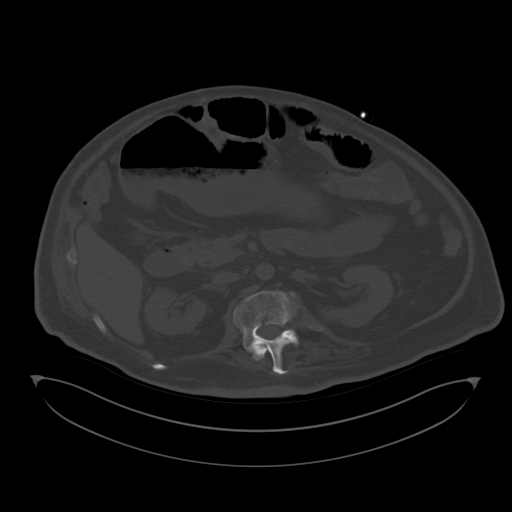
[im 63/86  soft-tissue]
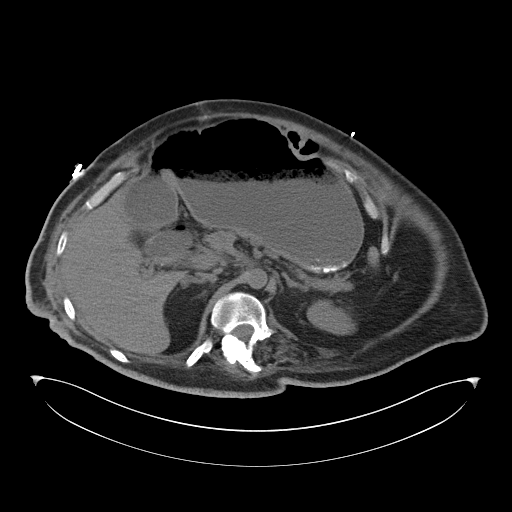
[im 68/86  soft-tissue]
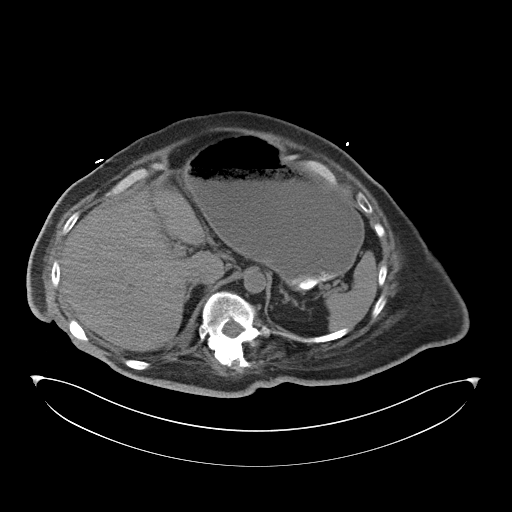
[im 72/86  soft-tissue]
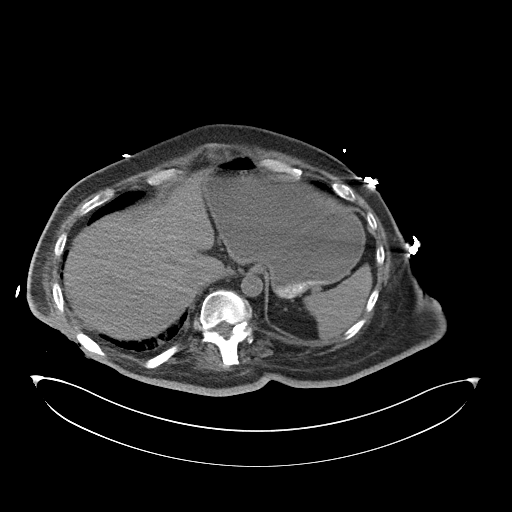
[im 81/86  soft-tissue]
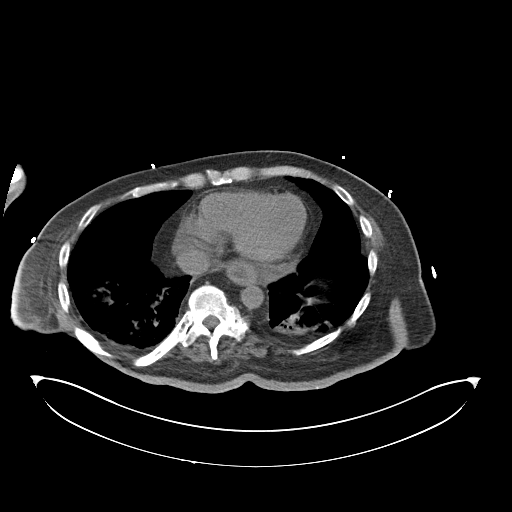

[Series 5: coronal st · coronal · 0.85mm/px · 3 of 163 slices shown]
[im 55/163  soft-tissue]
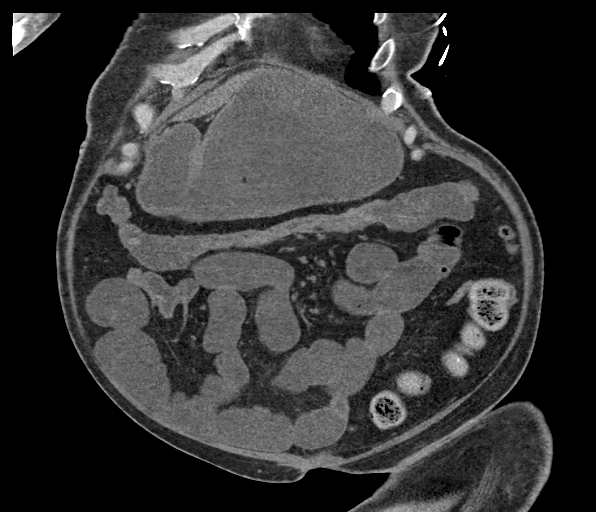
[im 73/163  soft-tissue]
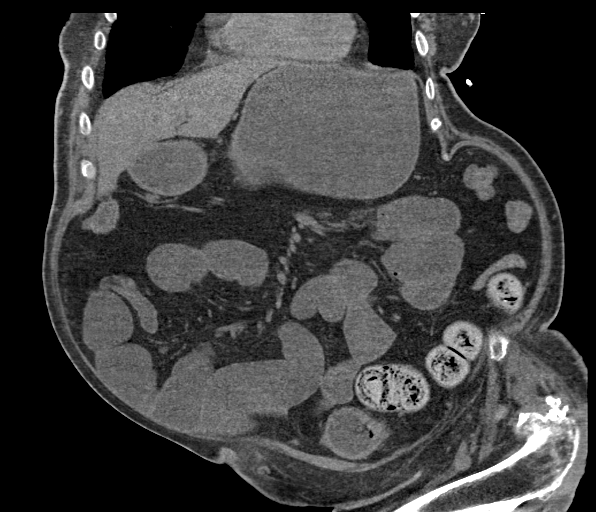
[im 91/163  soft-tissue]
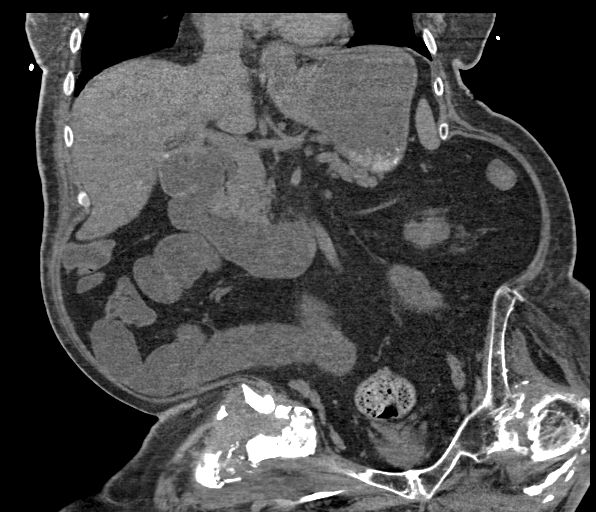

[16 of 46 positions shown; findings below may reference images not displayed]

FINDINGS: Lower chest: Increased airspace opacity is seen in the right lower
lobe, suspicious for pneumonia. Left lower lobe pleural-parenchymal
scarring shows no significant change.

Hepatobiliary: No mass visualized on this unenhanced exam. Prior
cholecystectomy. No evidence of biliary obstruction.

Pancreas: No mass or inflammatory process visualized on this
unenhanced exam.

Spleen:  Within normal limits in size.

Adrenals/Urinary tract: No evidence of urolithiasis or
hydronephrosis. A suprapubic catheter is seen in the urinary bladder
which is nearly empty.

Stomach/Bowel: Left lower quadrant colostomy. There has been
resolution of free intraperitoneal air since previous study.
Moderate dilatation of stomach and small bowel is seen with
transition point in the right lower quadrant in the area of the
distal ileum, which may be due to adhesion. No mass or inflammatory
process identified. No evidence of abscess or free fluid. No
evidence of pneumatosis.

Vascular/Lymphatic: No pathologically enlarged lymph nodes
identified. No evidence of abdominal aortic aneurysm.

Reproductive:  No mass or other significant abnormality.

Other:  None.

Musculoskeletal: Stable appearance of large decubitus ulcers with
chronic osteomyelitis involving the hips and bony pelvis.
IMPRESSION: 1. Diffuse small bowel dilatation with transition point in the
distal ileum, suspicious for distal small bowel obstruction. This
may be due to an adhesion; no obstructing mass or inflammatory
process identified.
2. Resolution of free intraperitoneal air since previous study.
3. Increased right lower lobe airspace opacity, suspicious for
pneumonia or aspiration.
4. Stable appearance of large decubitus ulcers with chronic
osteomyelitis involving the hips and pelvis.

## 2021-05-28 IMAGING — DX PORTABLE ABDOMEN - 1 VIEW
1 series · 1 of 1 positions shown · non-contrast
Comparison: None.

CLINICAL DATA: NG tube placement.

EXAM:
PORTABLE ABDOMEN - 1 VIEW

[abdomen kub]
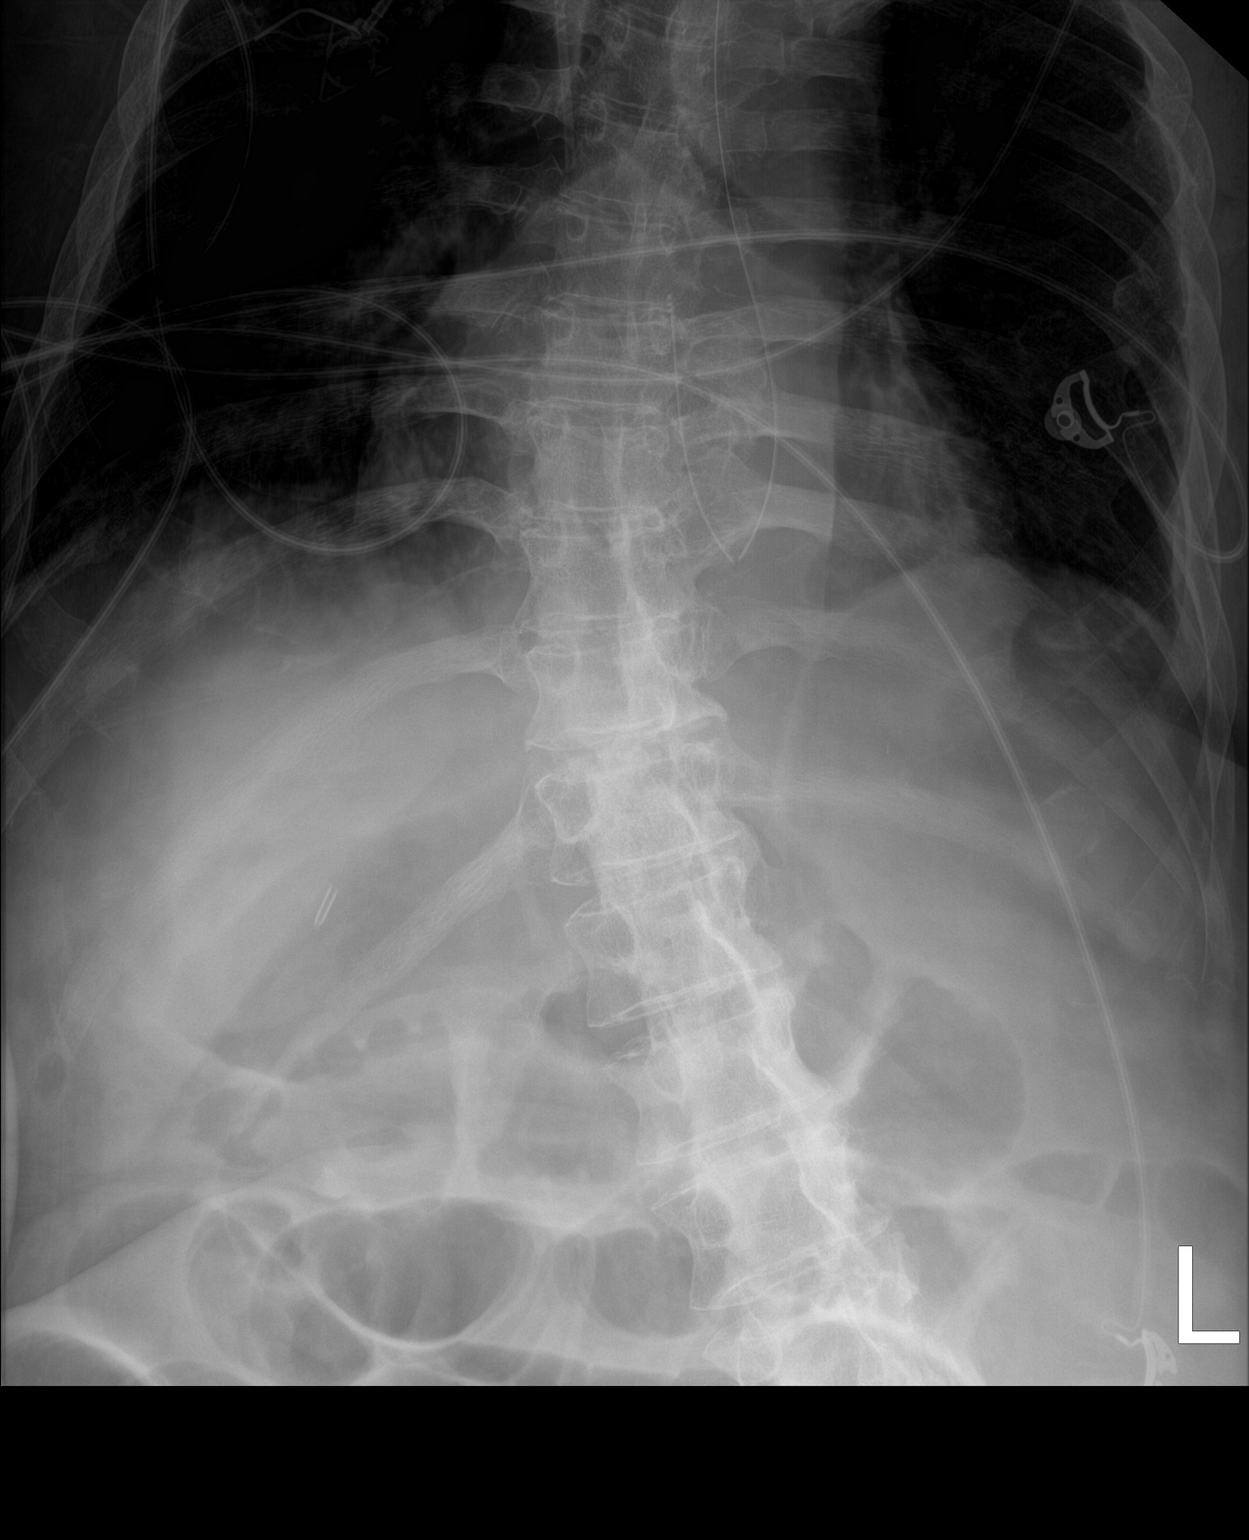

[1 of 1 positions shown; findings below may reference images not displayed]

FINDINGS: NG tube is looped at the gastroesophageal junction with the tip
projecting retrograde approximately 8 cm in the distal esophagus.
IMPRESSION: As above.

## 2021-05-28 IMAGING — DX PORTABLE ABDOMEN - 1 VIEW
1 series · 1 of 1 positions shown · non-contrast
Comparison: Single-view of the abdomen earlier today.

CLINICAL DATA: NG tube placement.

EXAM:
PORTABLE ABDOMEN - 1 VIEW

[abdomen kub]
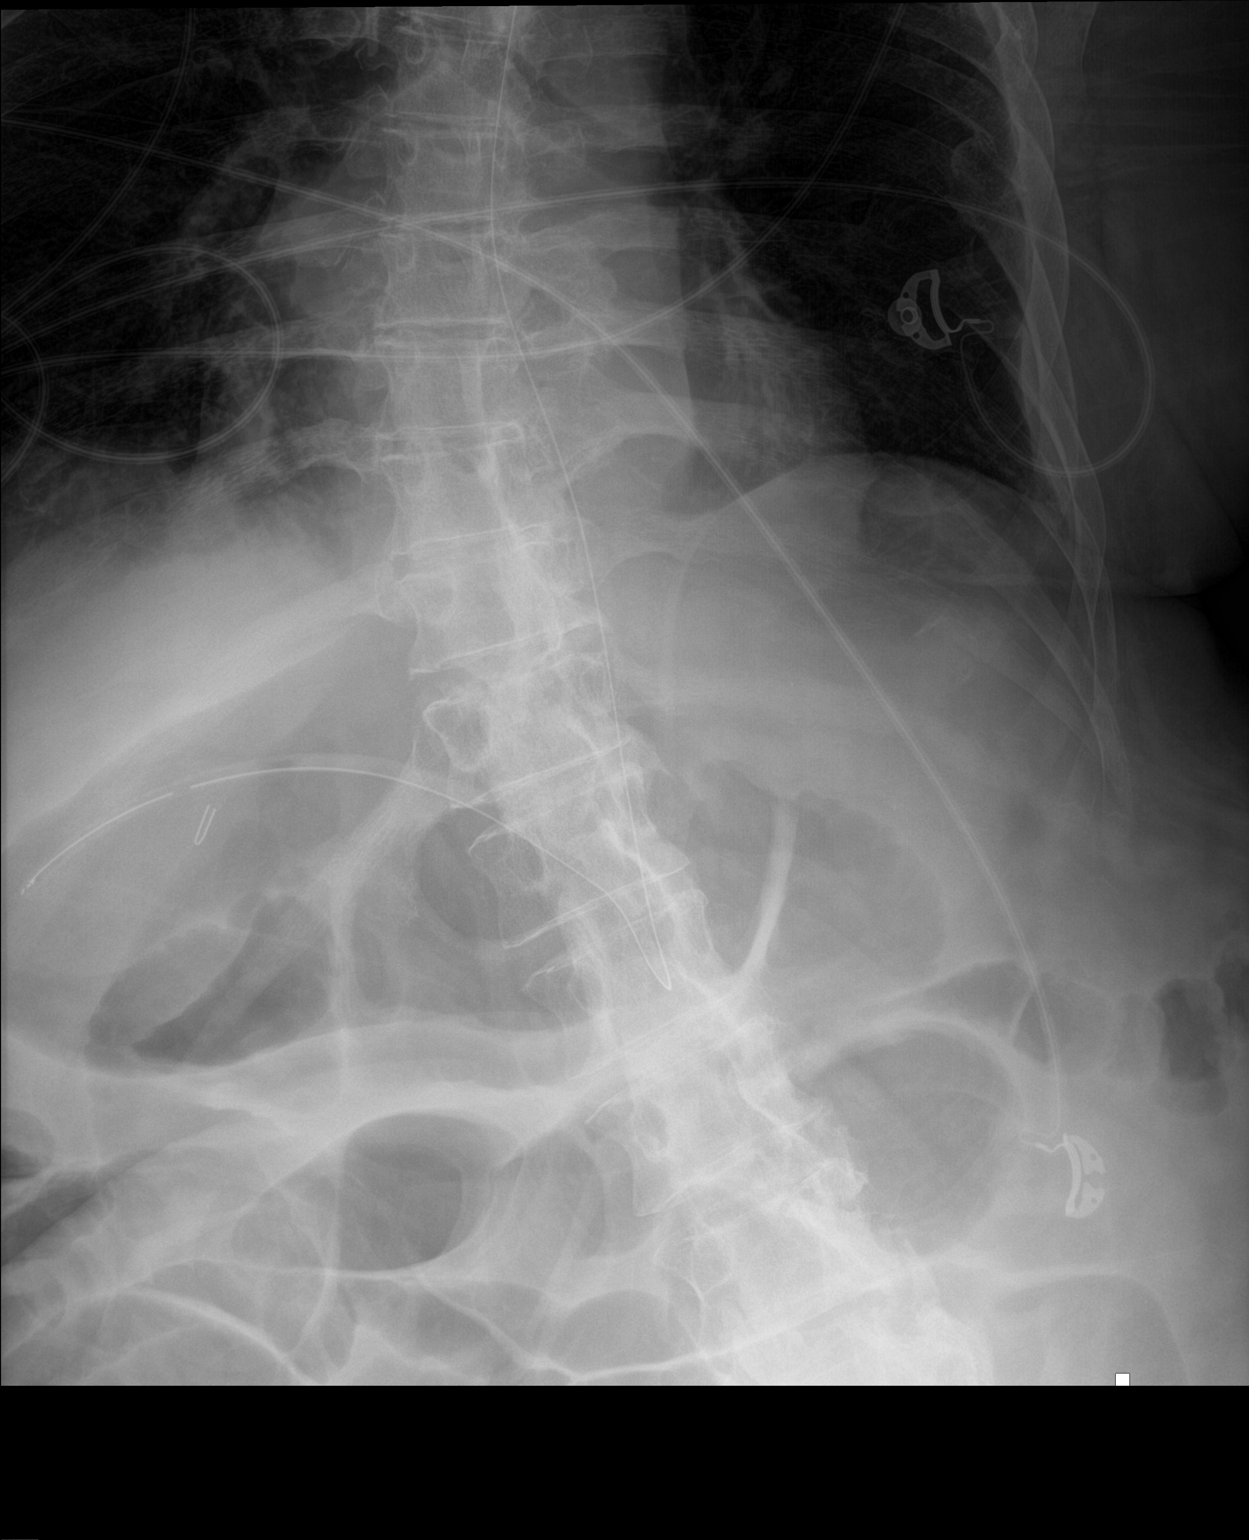

[1 of 1 positions shown; findings below may reference images not displayed]

FINDINGS: NG tube has been repositioned. The tip is now in the distal stomach.
IMPRESSION: As above.

## 2021-05-28 IMAGING — DX PORTABLE CHEST - 1 VIEW
2 series · 2 of 2 positions shown · non-contrast
Comparison: 10/07/2018, 09/29/2018

CLINICAL DATA: Vomiting

EXAM:
PORTABLE CHEST 1 VIEW

[chest ap (1 of 2)]
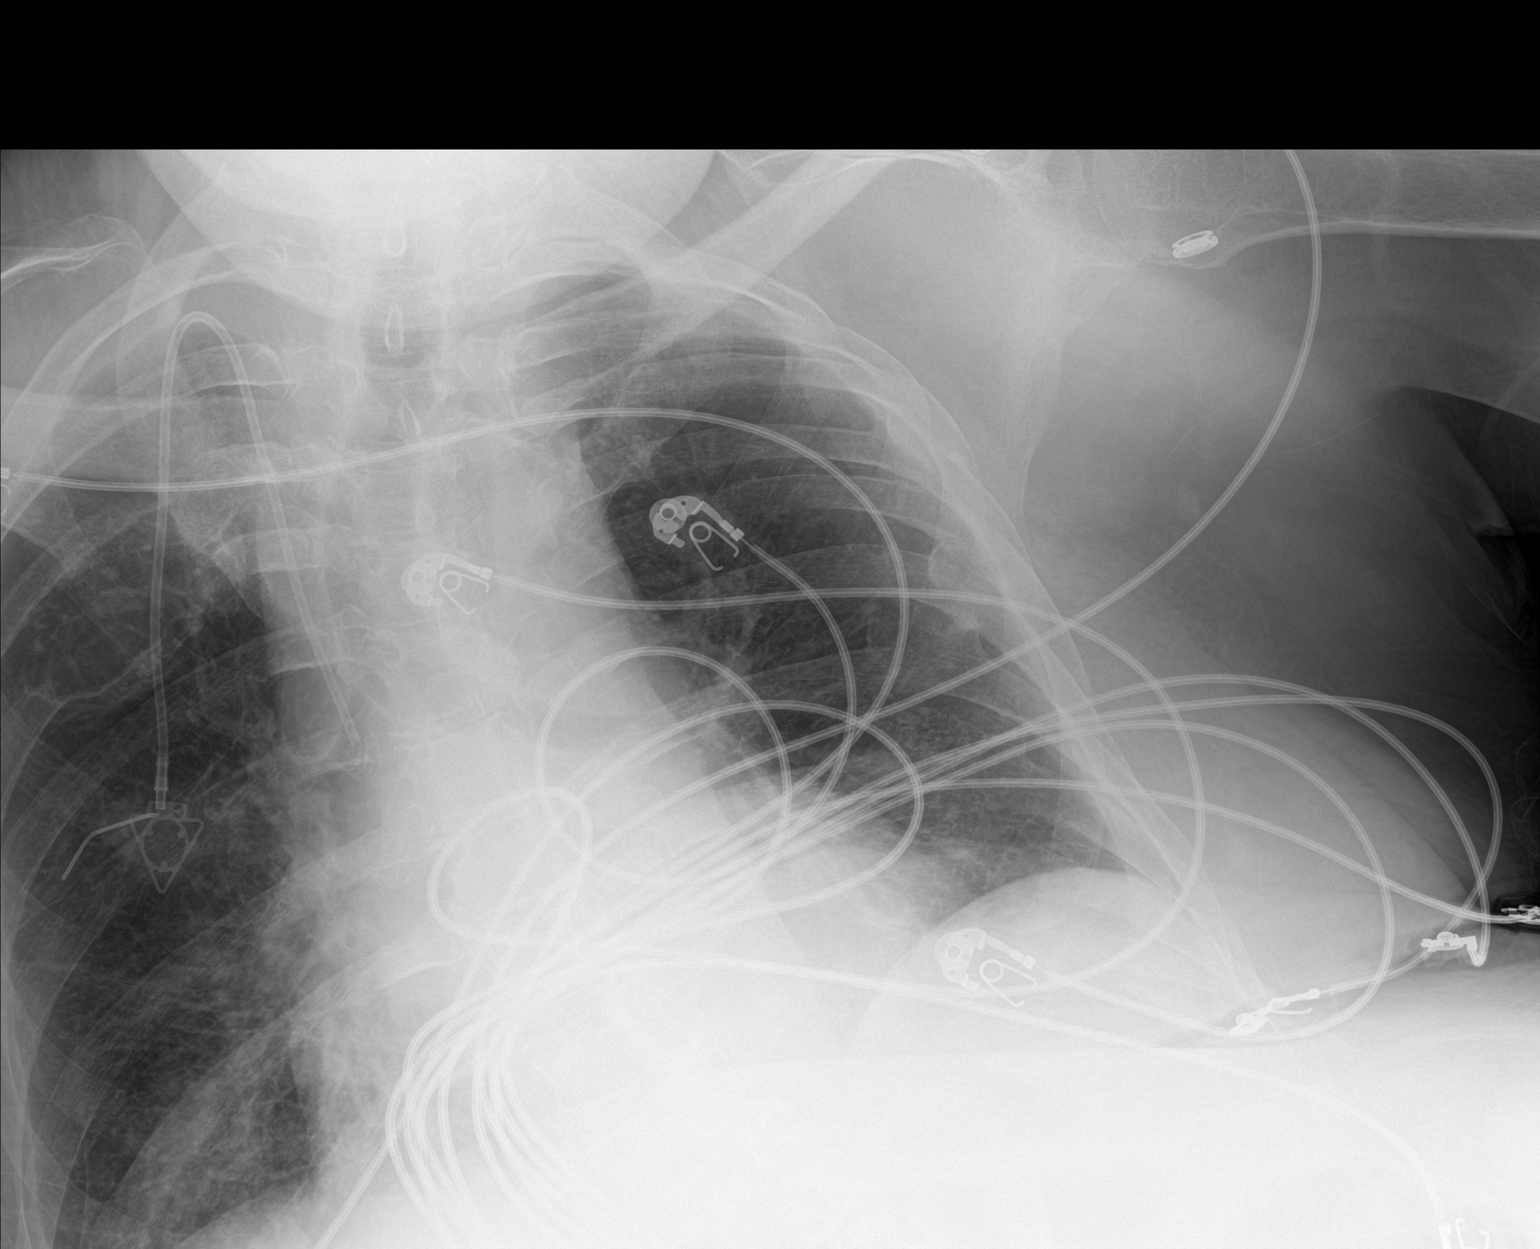

[chest ap (2 of 2)]
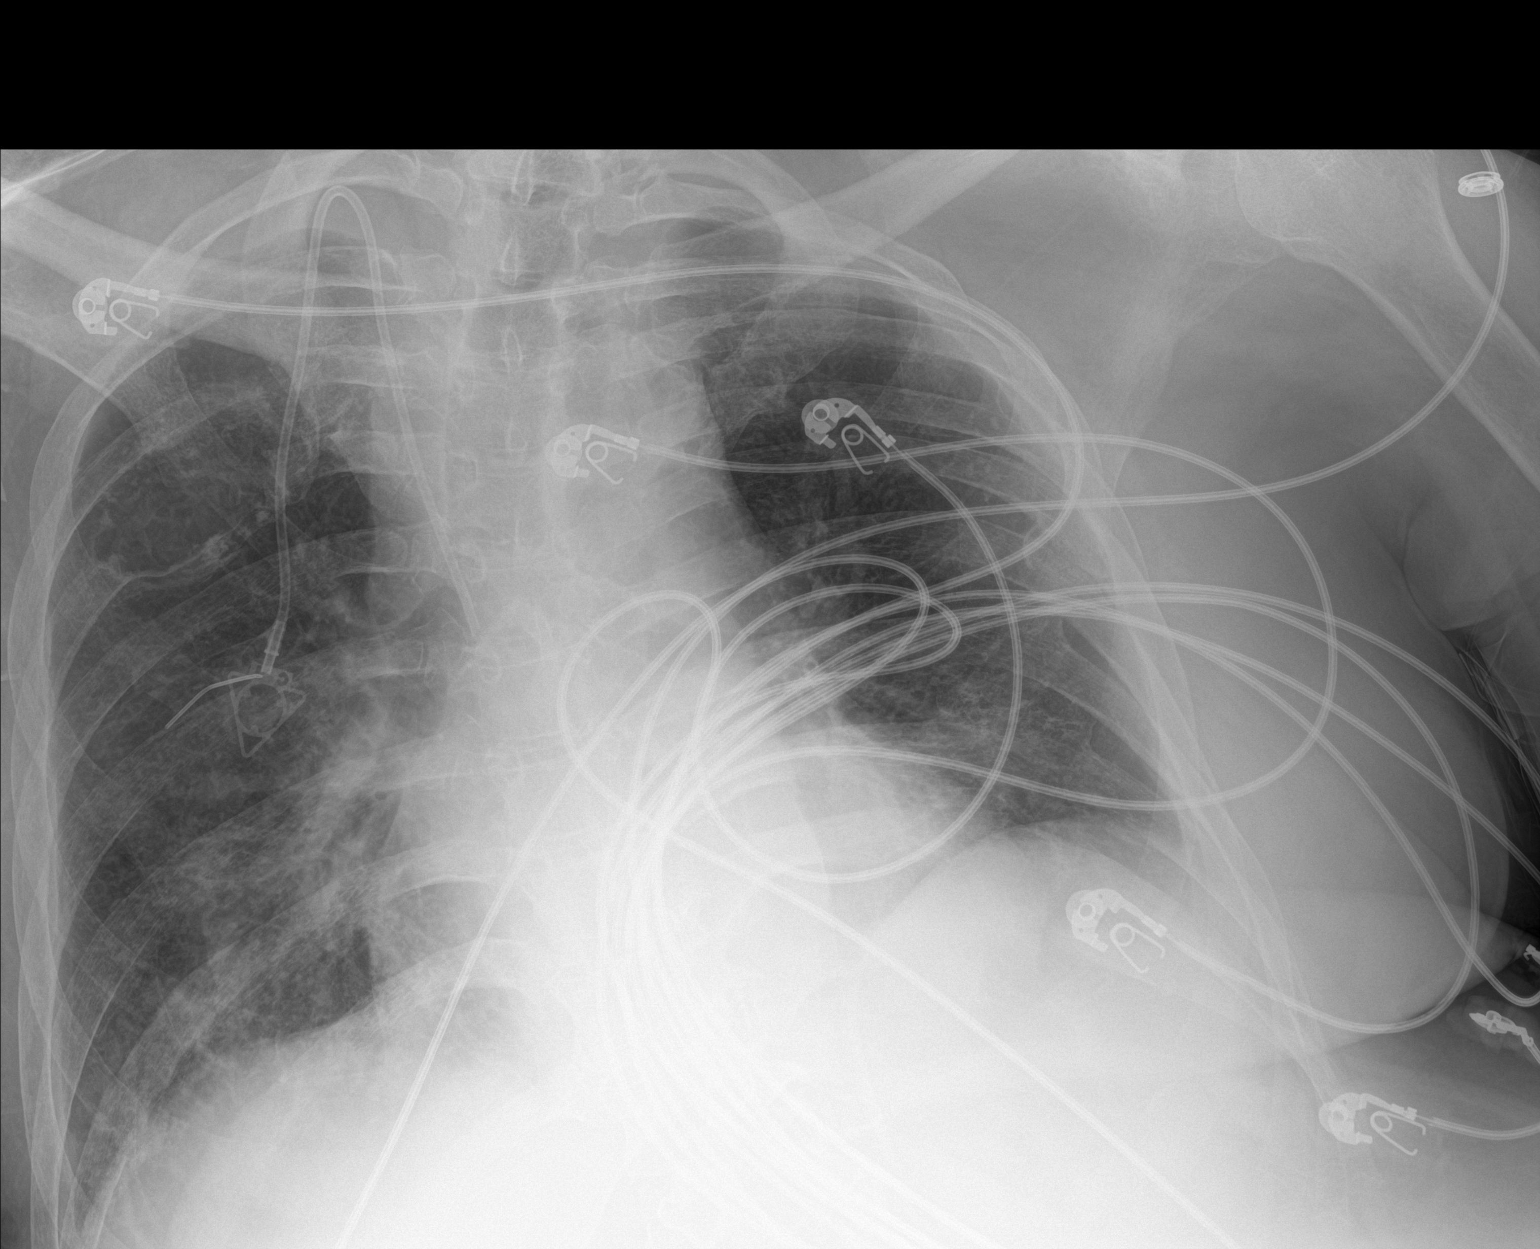

[2 of 2 positions shown; findings below may reference images not displayed]

FINDINGS: Right-sided central venous port tip over the SVC. Patchy atelectasis
at the right base. No consolidation or effusion. Stable
cardiomediastinal silhouette. No pneumothorax. Wire cerclage above
the thoracic inlet.
IMPRESSION: Patchy atelectasis at the right base.

## 2021-05-29 IMAGING — DX PORTABLE ABDOMEN - 1 VIEW
1 series · 1 of 1 positions shown · non-contrast
Comparison: Portable chest today and CT Abdomen and Pelvis
yesterday.

CLINICAL DATA: 53-year-old male with shortness of breath. Small
bowel obstruction. Right lower lobe pneumonia.

EXAM:
PORTABLE ABDOMEN - 1 VIEW

[abdomen kub]
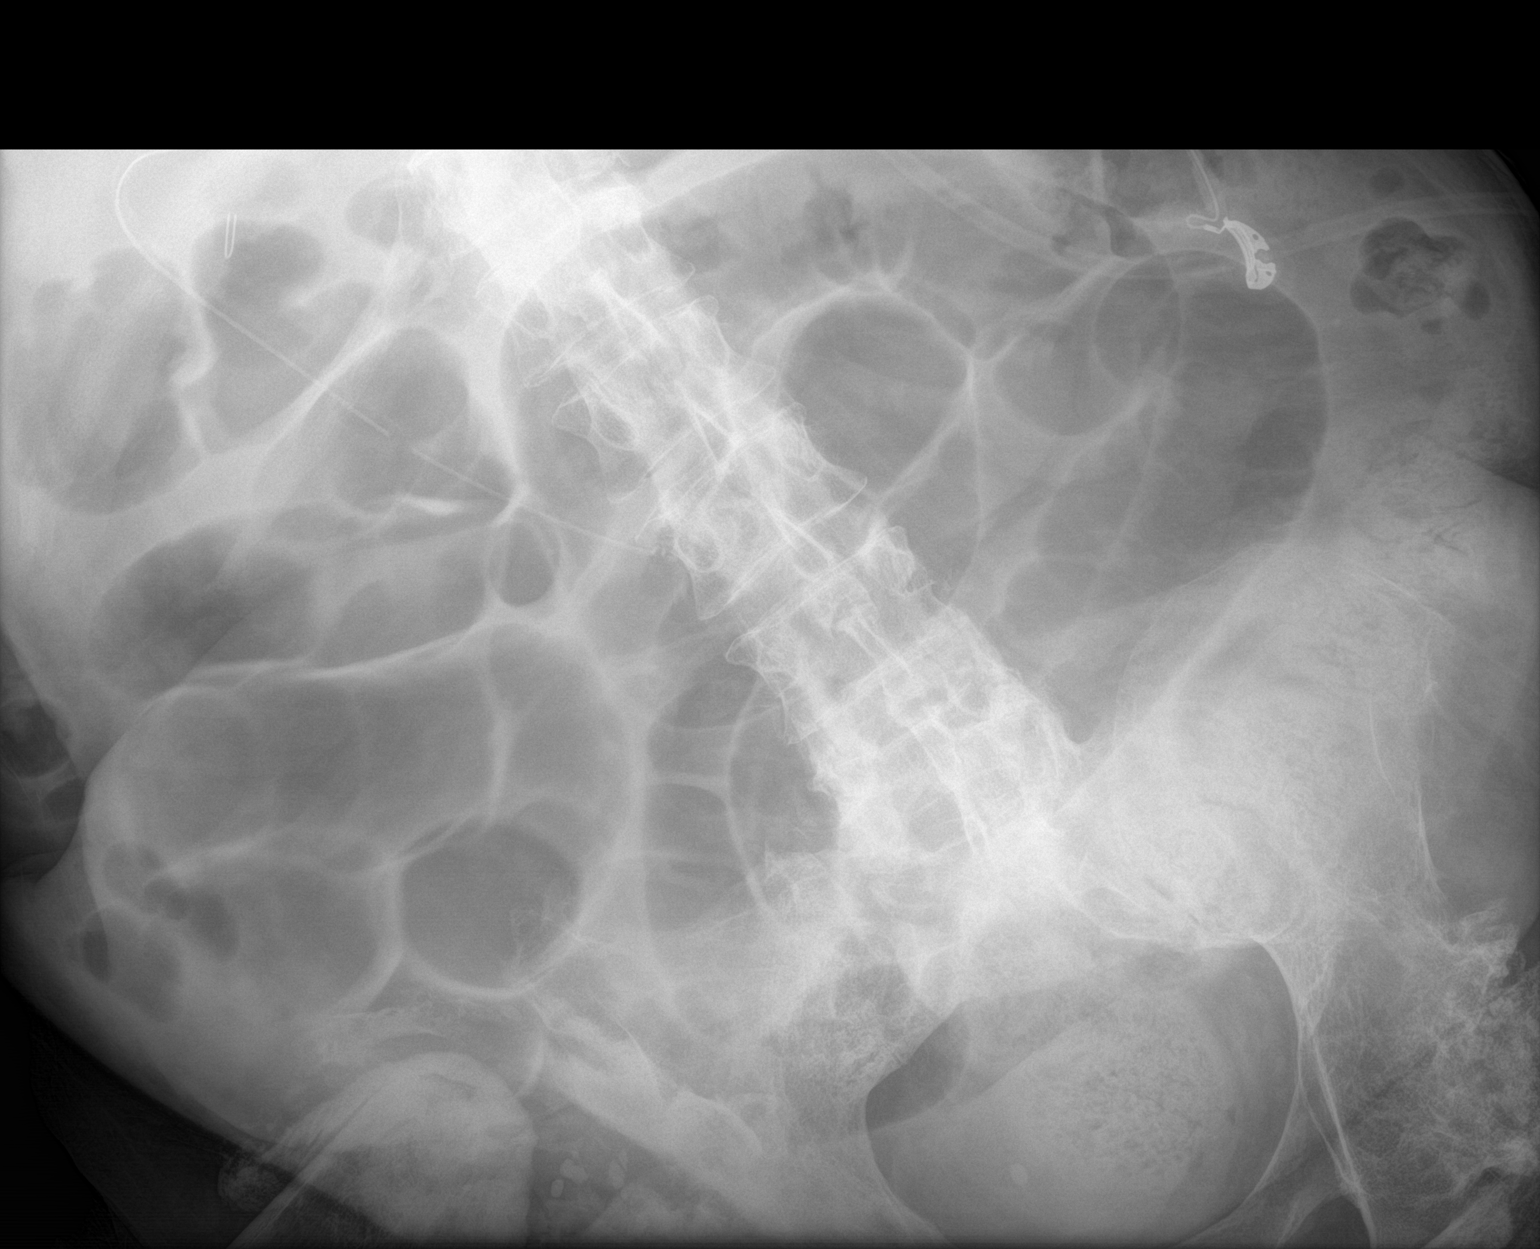

[1 of 1 positions shown; findings below may reference images not displayed]

FINDINGS: Portable AP view at 0550 hours. Enteric tube now in place and
courses from the distal esophagus through the stomach and into the
right abdomen compatible with tip placement at the junction of the
2nd and 3rd portions of the duodenum. Side hole placement is in the
2nd portion of the duodenum.

Persistent dilated small bowel loops up to 40 millimeters. Stable
gas pattern.

Stable severe osseous abnormality at both hips.
IMPRESSION: 1. Enteric tube placed into the stomach and terminating in the
duodenum, side hole at the 2nd portions of the duodenum.
2. Persistent small bowel obstruction gas pattern.

## 2021-05-29 IMAGING — DX PORTABLE ABDOMEN - 1 VIEW
2 series · 2 of 2 positions shown · non-contrast
Comparison: 10/15/2018

CLINICAL DATA: Small-bowel obstruction

EXAM:
PORTABLE ABDOMEN - 1 VIEW

[abdomen kub (1 of 2)]
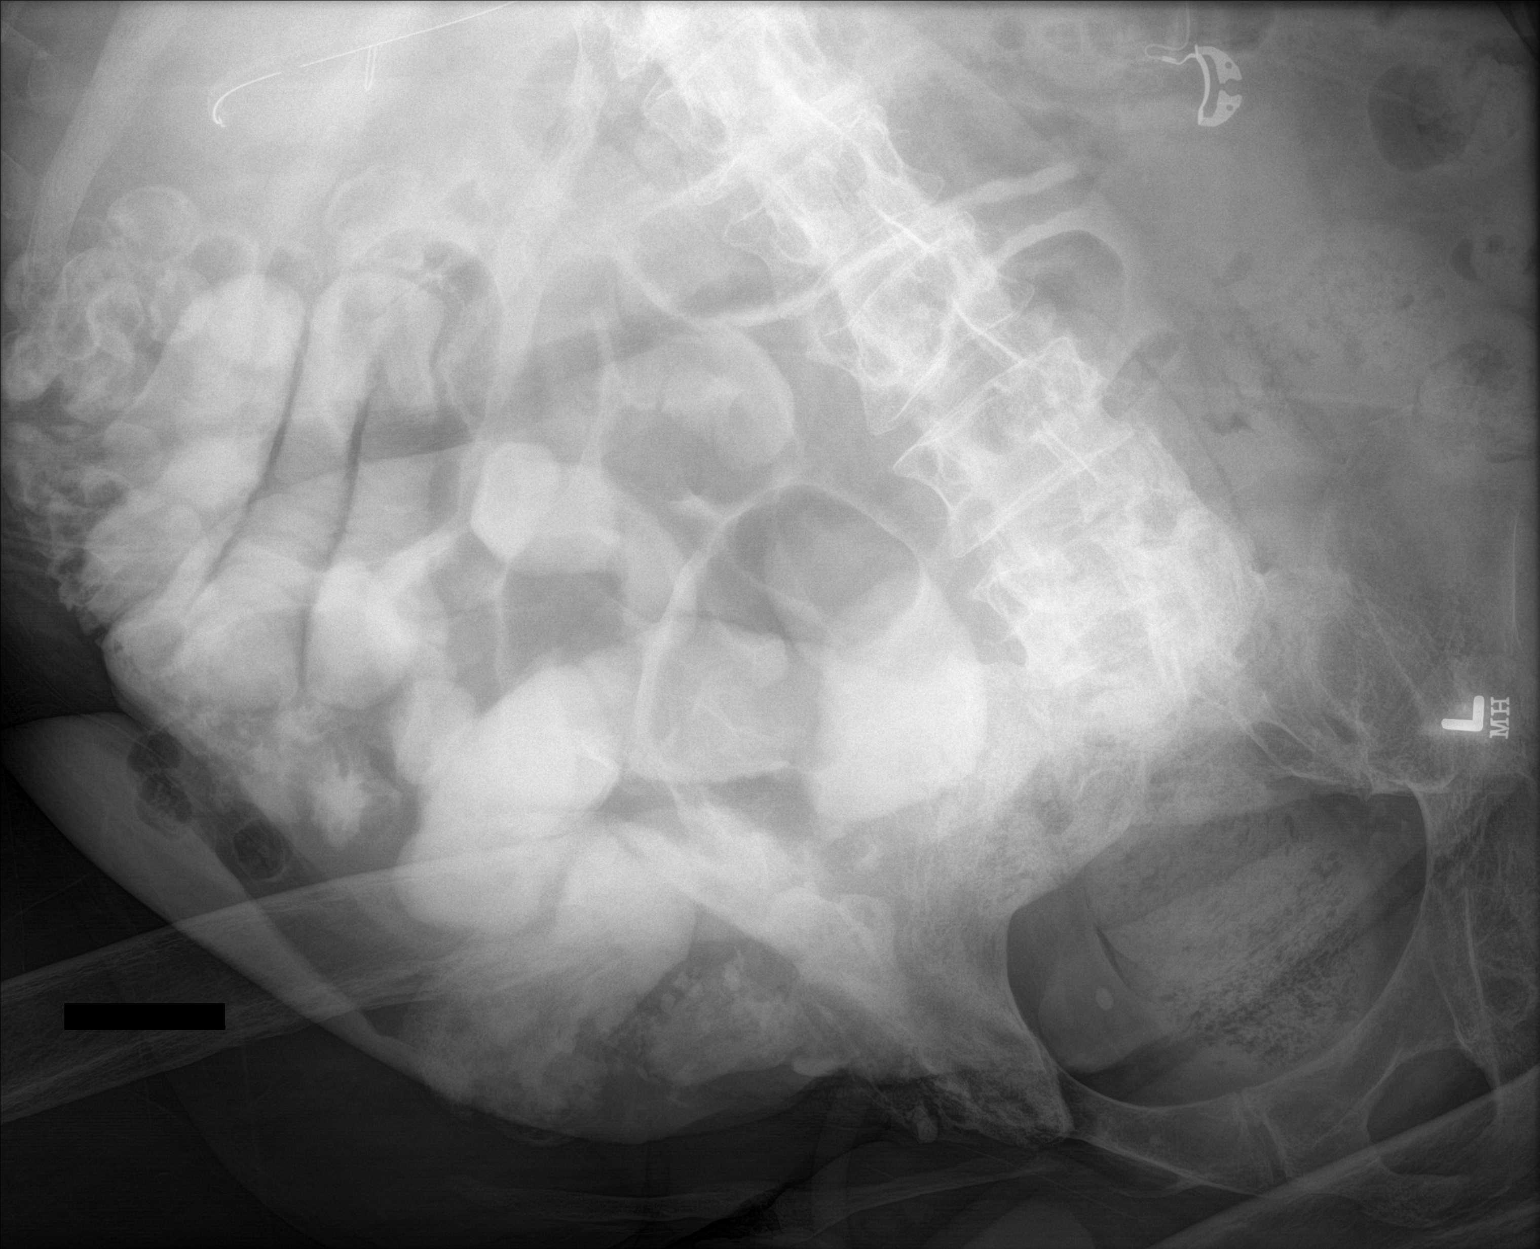

[abdomen kub (2 of 2)]
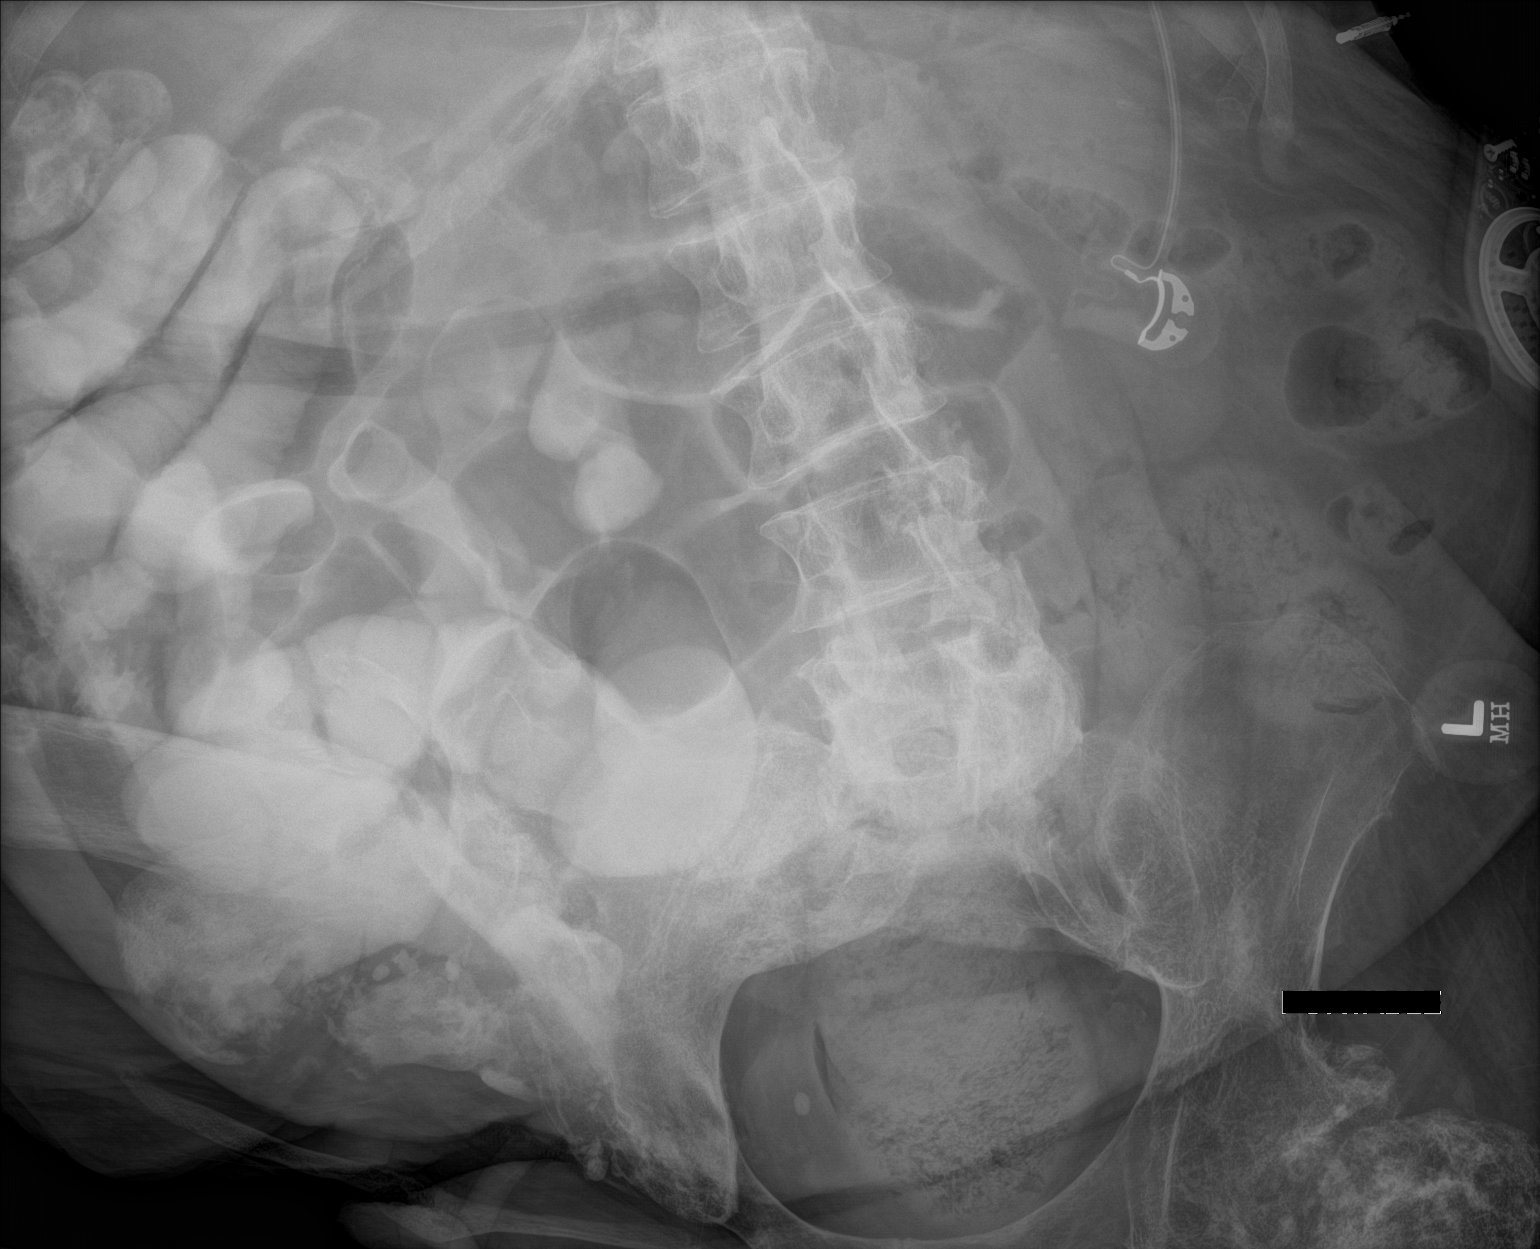

[2 of 2 positions shown; findings below may reference images not displayed]

FINDINGS: There are persistently dilated loops of small bowel scattered
throughout the abdomen. The majority of the oral contrast remains
within small bowel loops in the right abdomen. There is some oral
contrast in the ascending colon. There is a large amount of stool in
the rectum and descending colon. The enteric tube is partially
visualized. Chronic changes are noted of the pelvic bones.
IMPRESSION: 1. The majority of the oral contrast remains within the small bowel.
There are some persistently dilated loops of small bowel scattered
throughout the abdomen.
2. Oral contrast is seen within the ascending colon.
3. Large amount of stool throughout the rectum.

## 2021-05-30 IMAGING — DX PORTABLE CHEST - 1 VIEW
1 series · 1 of 1 positions shown · non-contrast
Comparison: 10/15/2018

CLINICAL DATA: Acute respiratory failure

EXAM:
PORTABLE CHEST 1 VIEW

[chest ap]
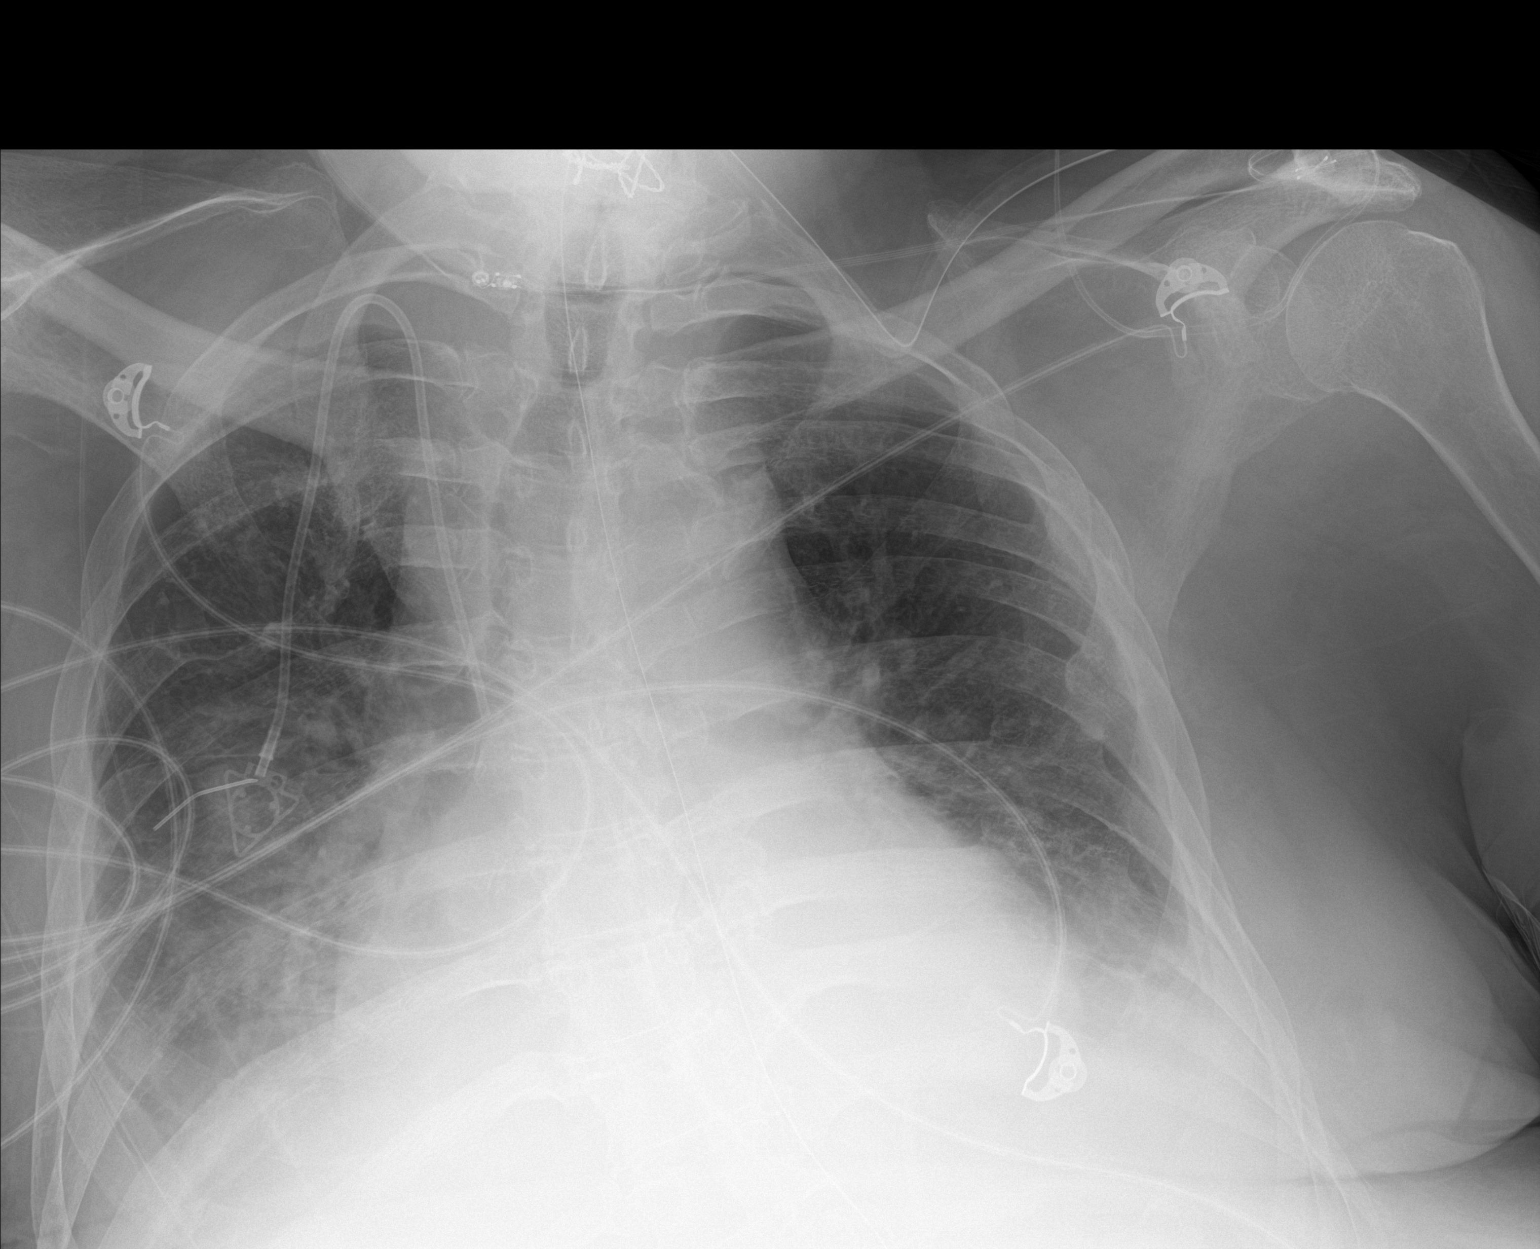

[1 of 1 positions shown; findings below may reference images not displayed]

FINDINGS: Cardiac shadow remains enlarged. Gastric catheter is noted within
the stomach. Right chest wall port is seen. Increasing bibasilar
opacification is noted likely related to atelectasis. No
pneumothorax is seen. No acute bony abnormality is noted.
IMPRESSION: Increasing bibasilar atelectasis.

## 2021-05-30 IMAGING — DX PORTABLE ABDOMEN - 1 VIEW
2 series · 3 of 3 positions shown · non-contrast
Comparison: 10/15/2018

CLINICAL DATA: Follow up small bowel obstruction

EXAM:
PORTABLE ABDOMEN - 1 VIEW

[Series 1: abdomen kub · 0.14mm/px · 2 of 2 slices shown (1 of 2)]
[im 1/2]
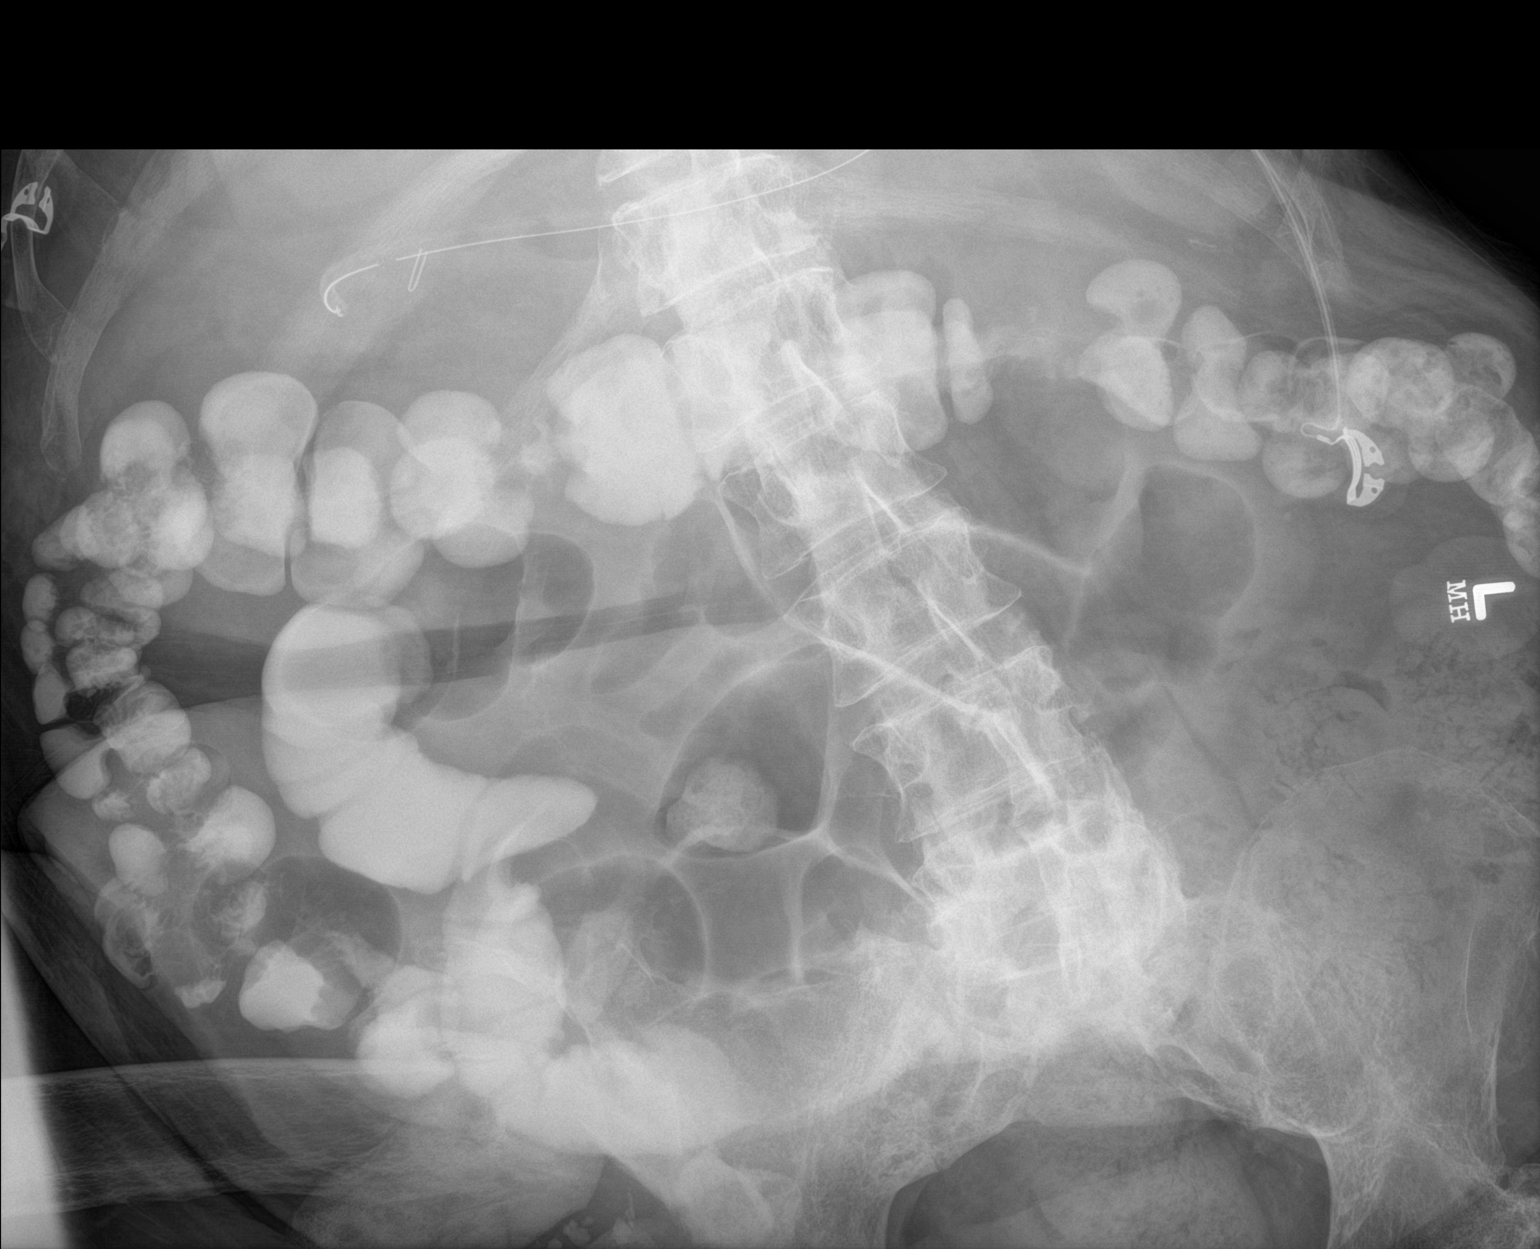
[im 2/2]
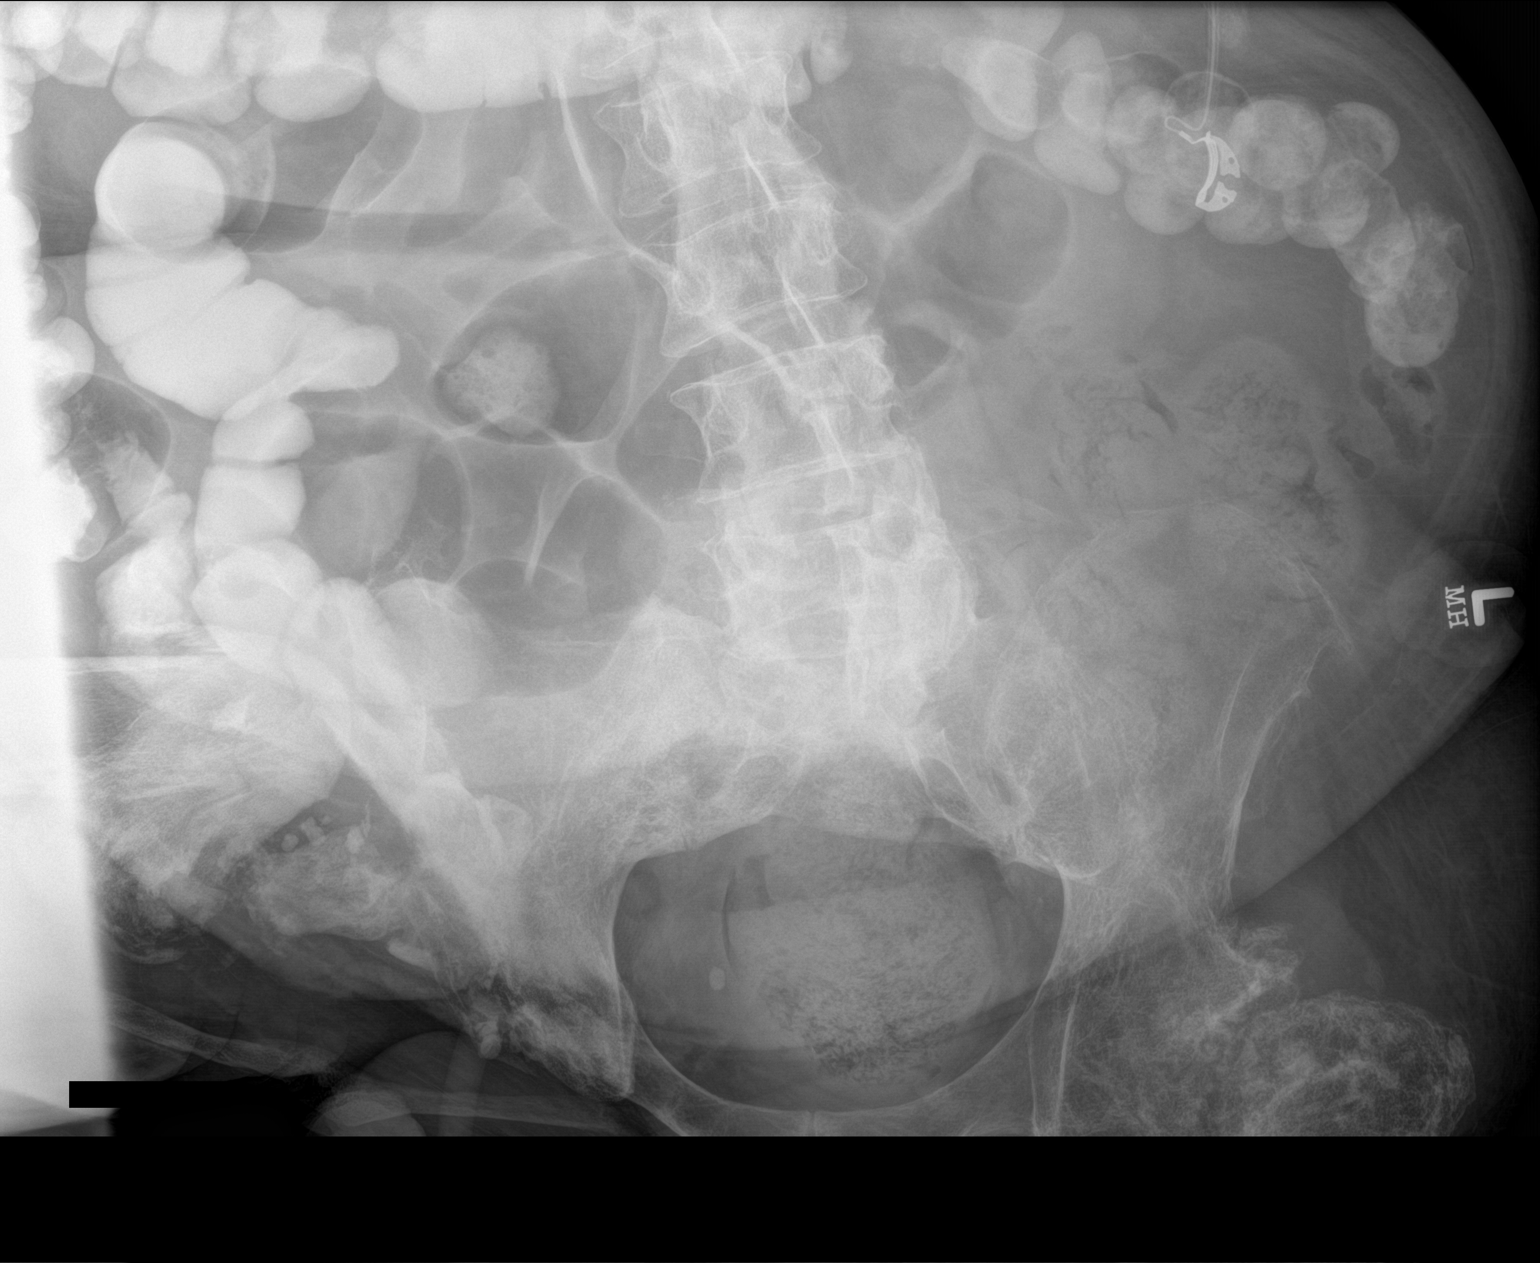

[abdomen kub (2 of 2)]
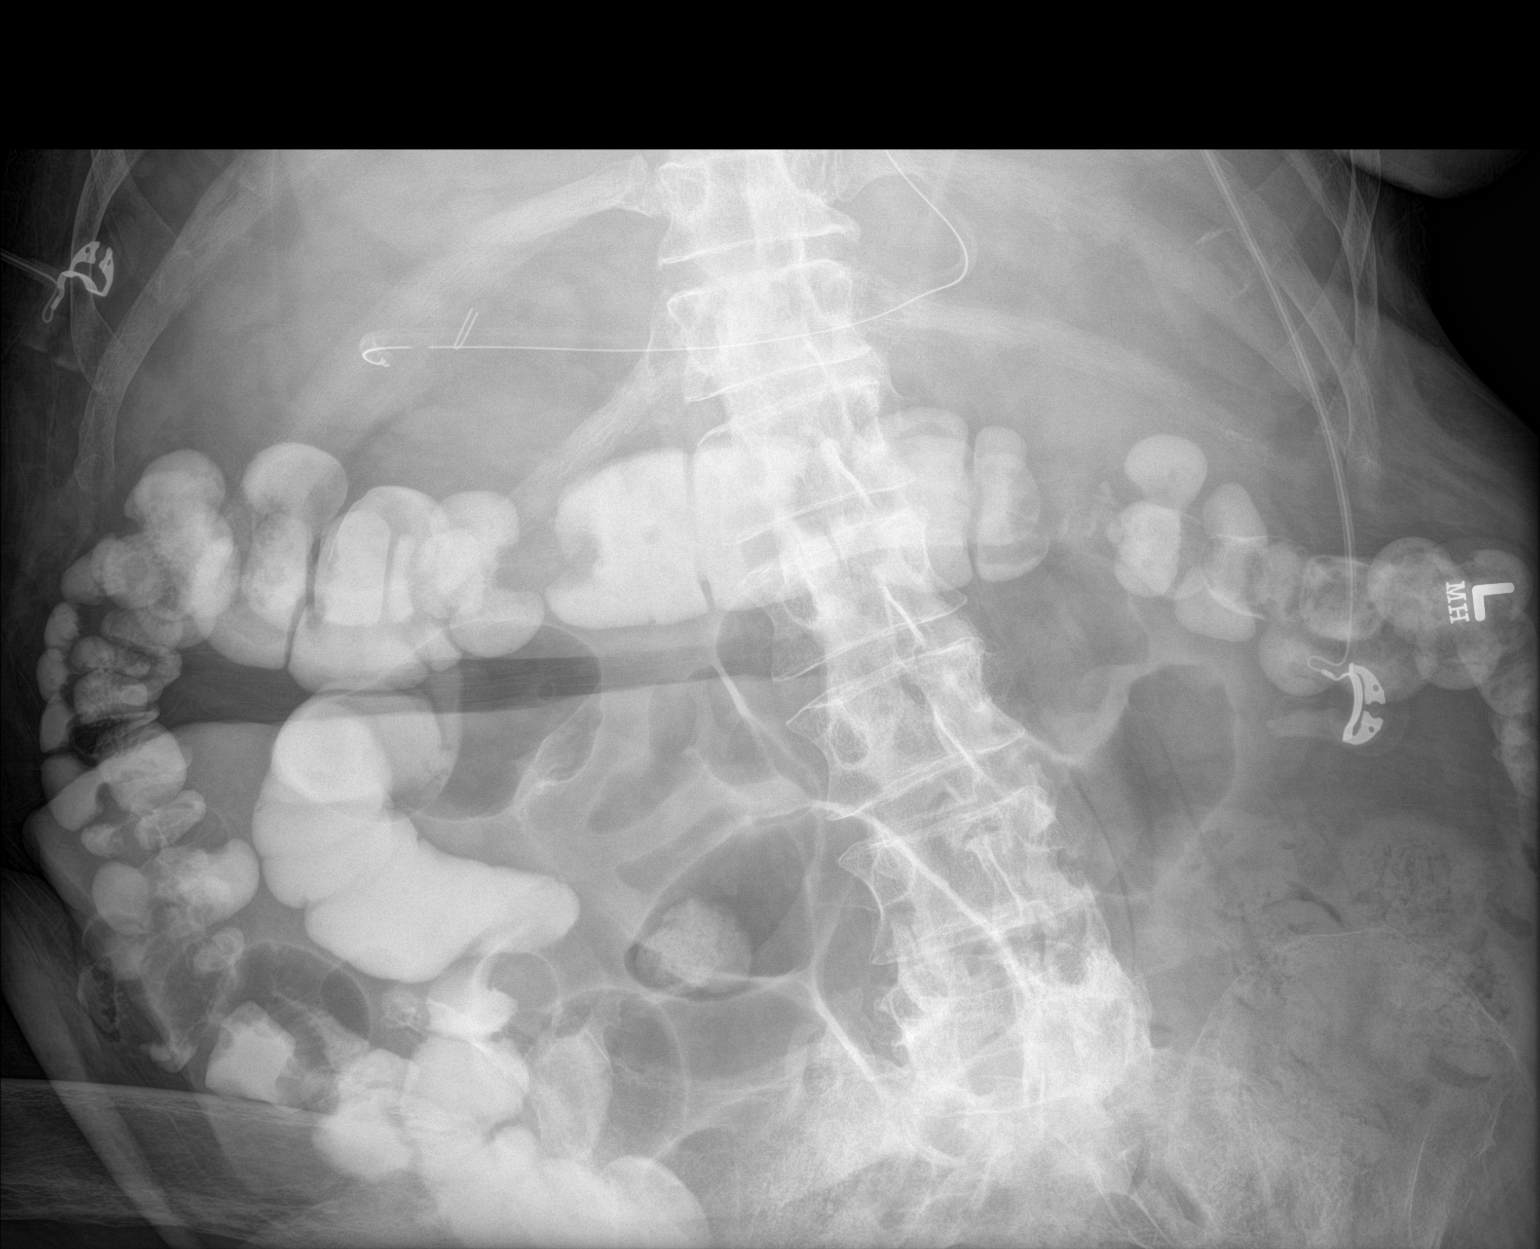

[3 of 3 positions shown; findings below may reference images not displayed]

FINDINGS: Previously administered contrast material now lies almost entirely
within the colon with only a small amount within the distal ileum.
No free air is seen. Gastric catheter is noted within the stomach.
Chronic changes in the hip joints are noted
IMPRESSION: Previously administered contrast now lies almost entirely within the
colon.

## 2021-06-01 IMAGING — DX PORTABLE ABDOMEN - 1 VIEW
3 series · 4 of 4 positions shown · non-contrast
Comparison: 10/16/2018

CLINICAL DATA: Follow up small bowel obstruction

EXAM:
PORTABLE ABDOMEN - 1 VIEW

[abdomen kub (1 of 3)]
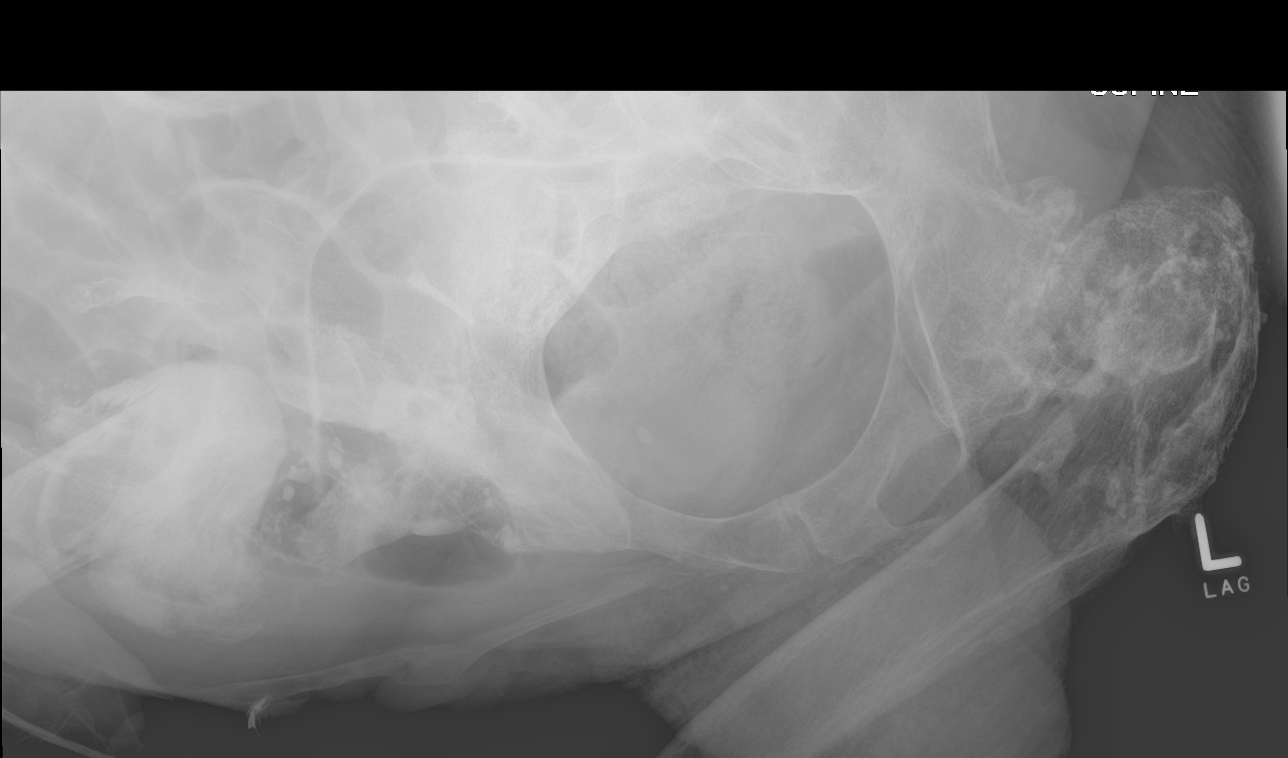

[Series 2: abdomen kub · 0.14mm/px · 2 of 2 slices shown (2 of 3)]
[im 1/2]
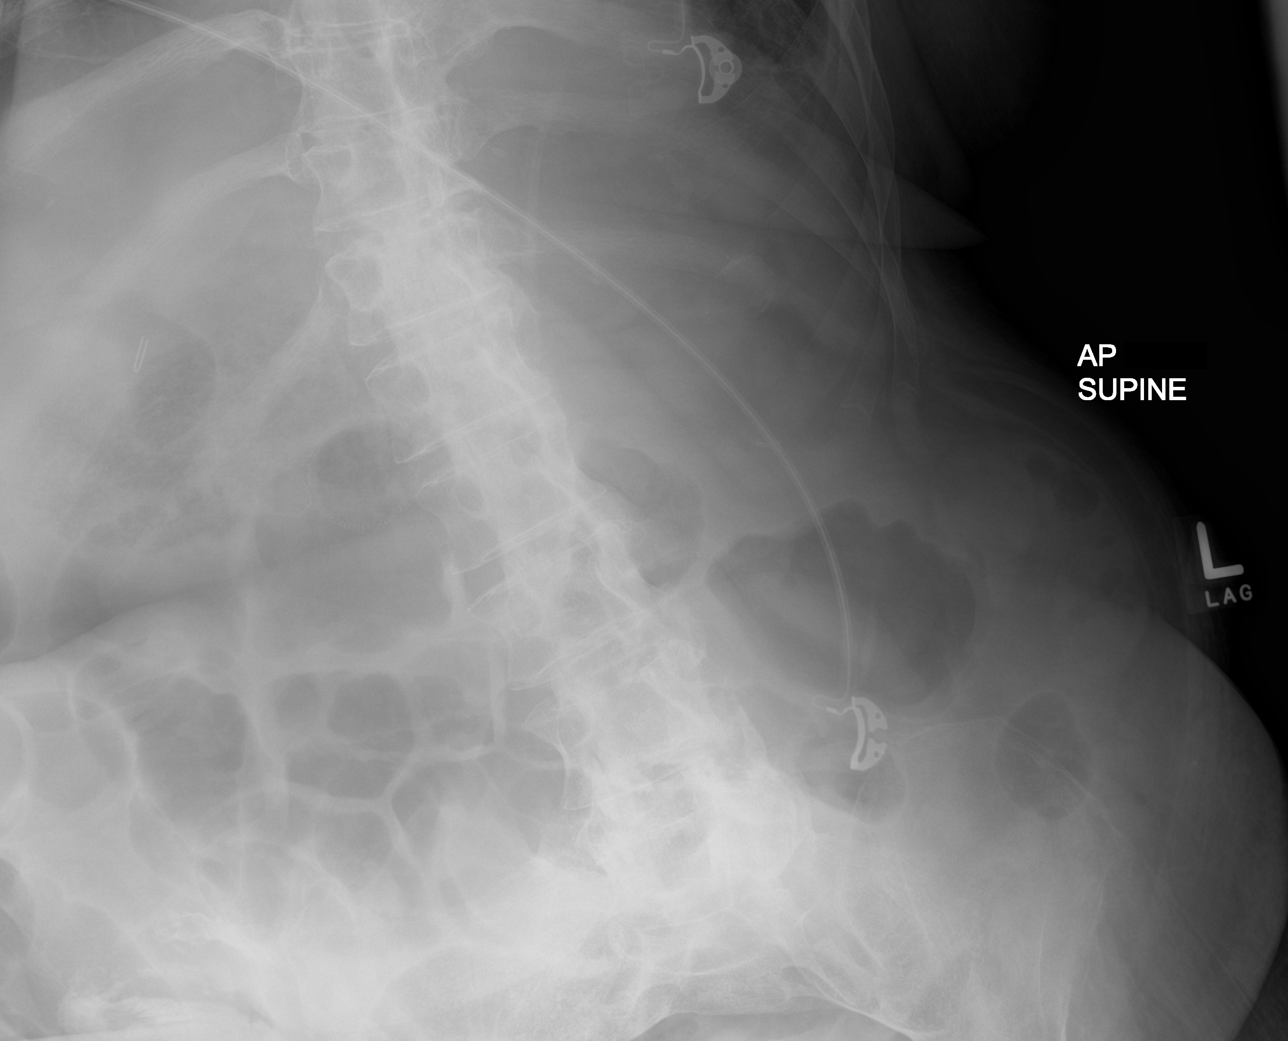
[im 2/2]
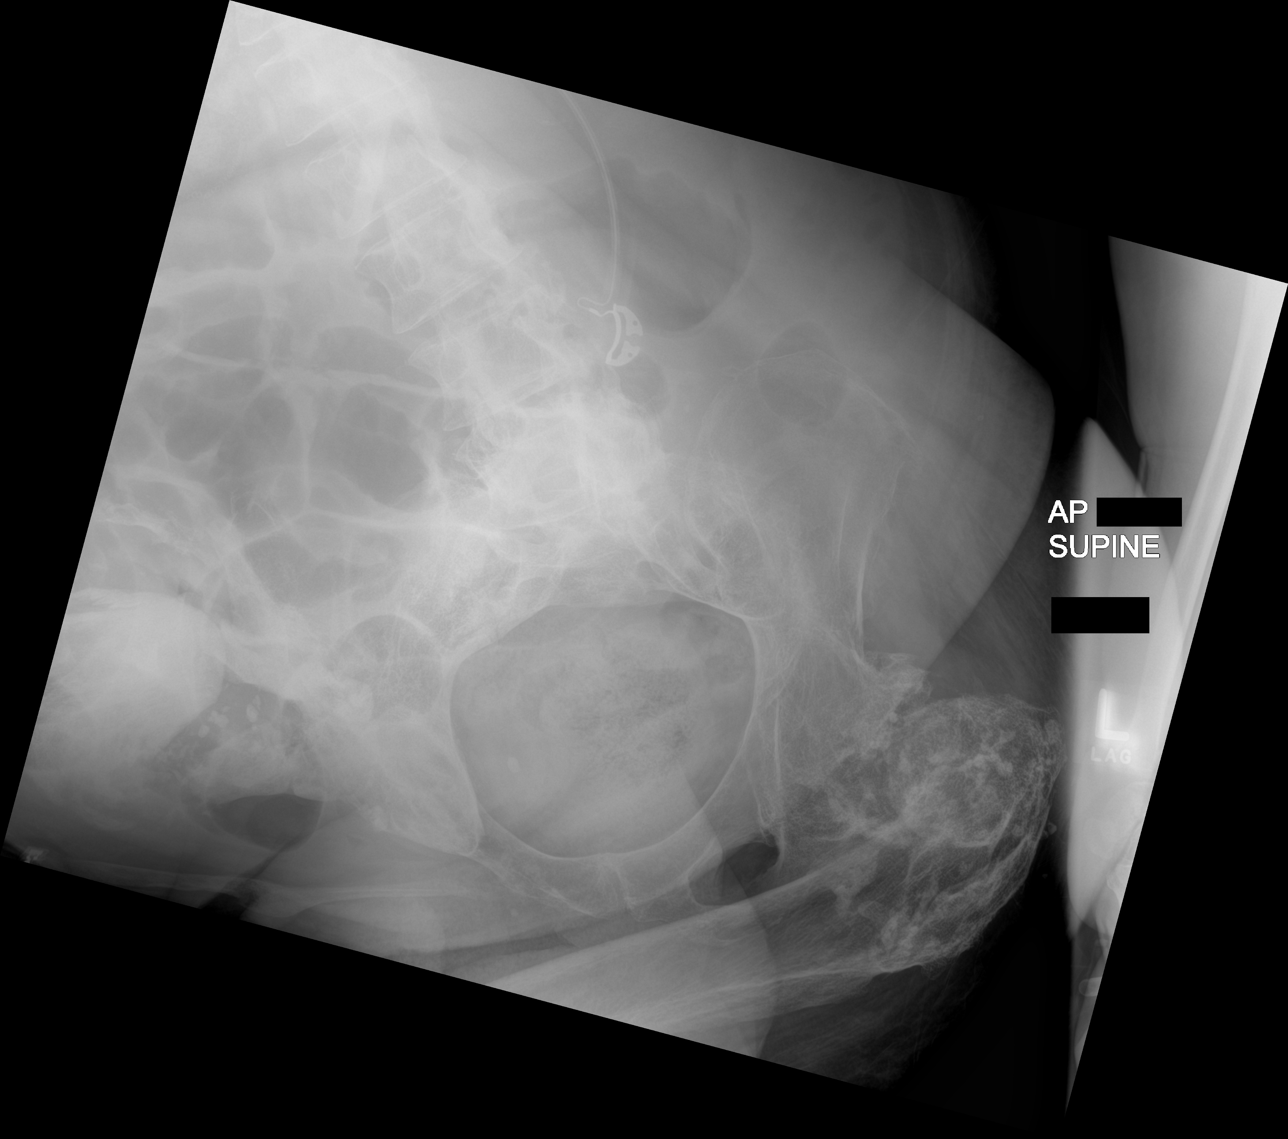

[abdomen kub (3 of 3)]
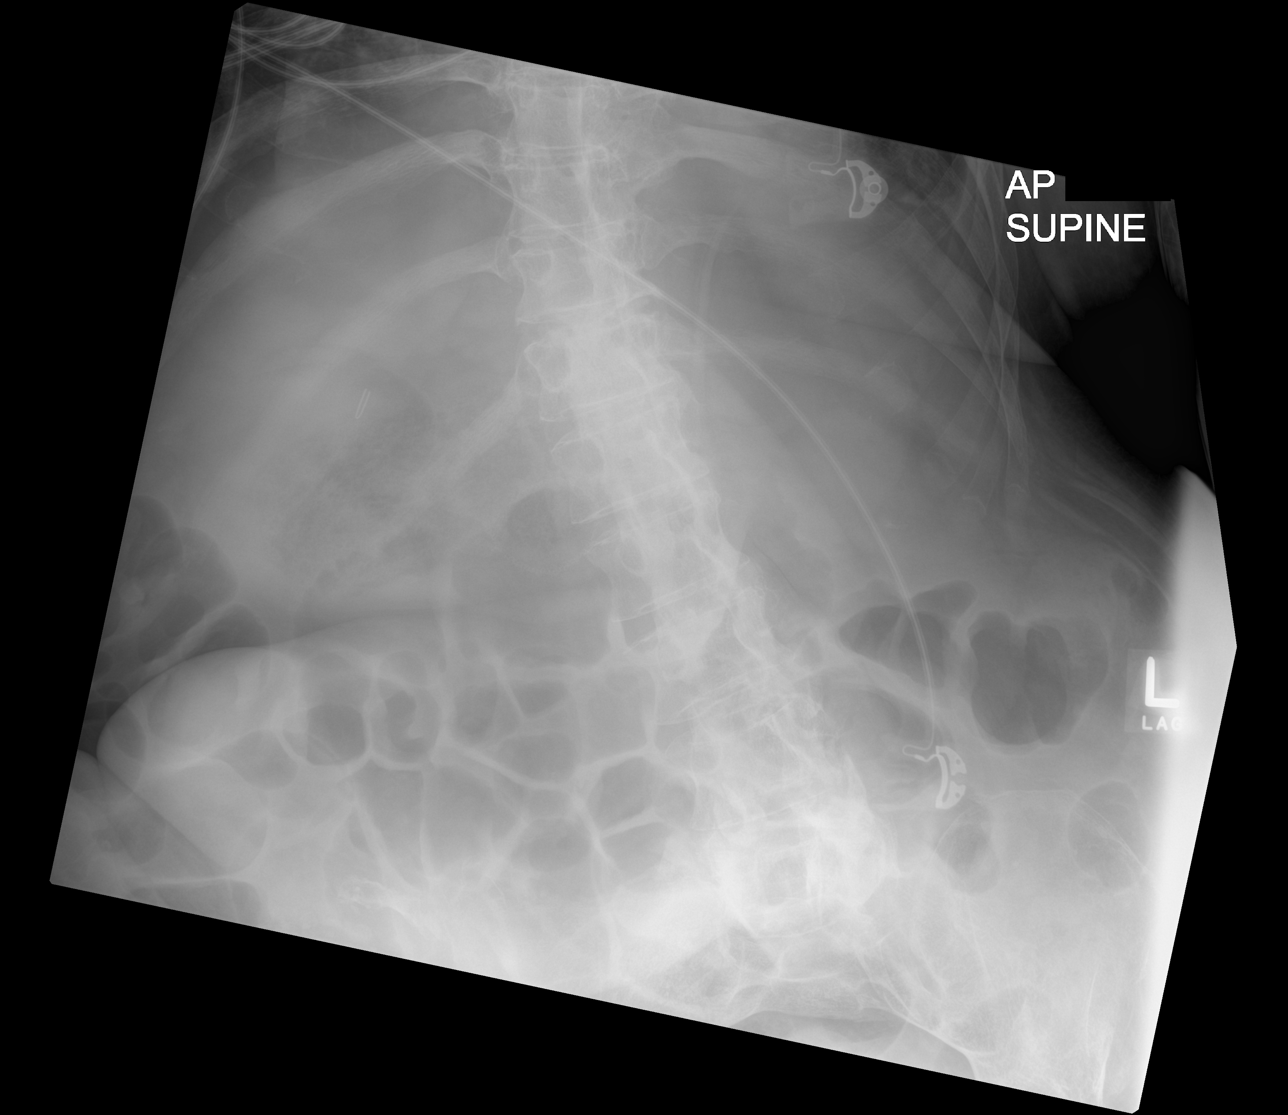

[4 of 4 positions shown; findings below may reference images not displayed]

FINDINGS: The previously administered contrast has now cleared from the colon.
No free air is noted. No obstructive changes are seen. Chronic
changes about the hip joints are again noted and stable.
IMPRESSION: No acute abnormality noted. Previously seen contrast has cleared
from the colon. No obstructive changes are noted.

## 2021-06-01 IMAGING — DX PORTABLE CHEST - 1 VIEW
1 series · 1 of 1 positions shown · non-contrast
Comparison: 10/16/2018

CLINICAL DATA: Shortness of breath

EXAM:
PORTABLE CHEST 1 VIEW

[chest ap]
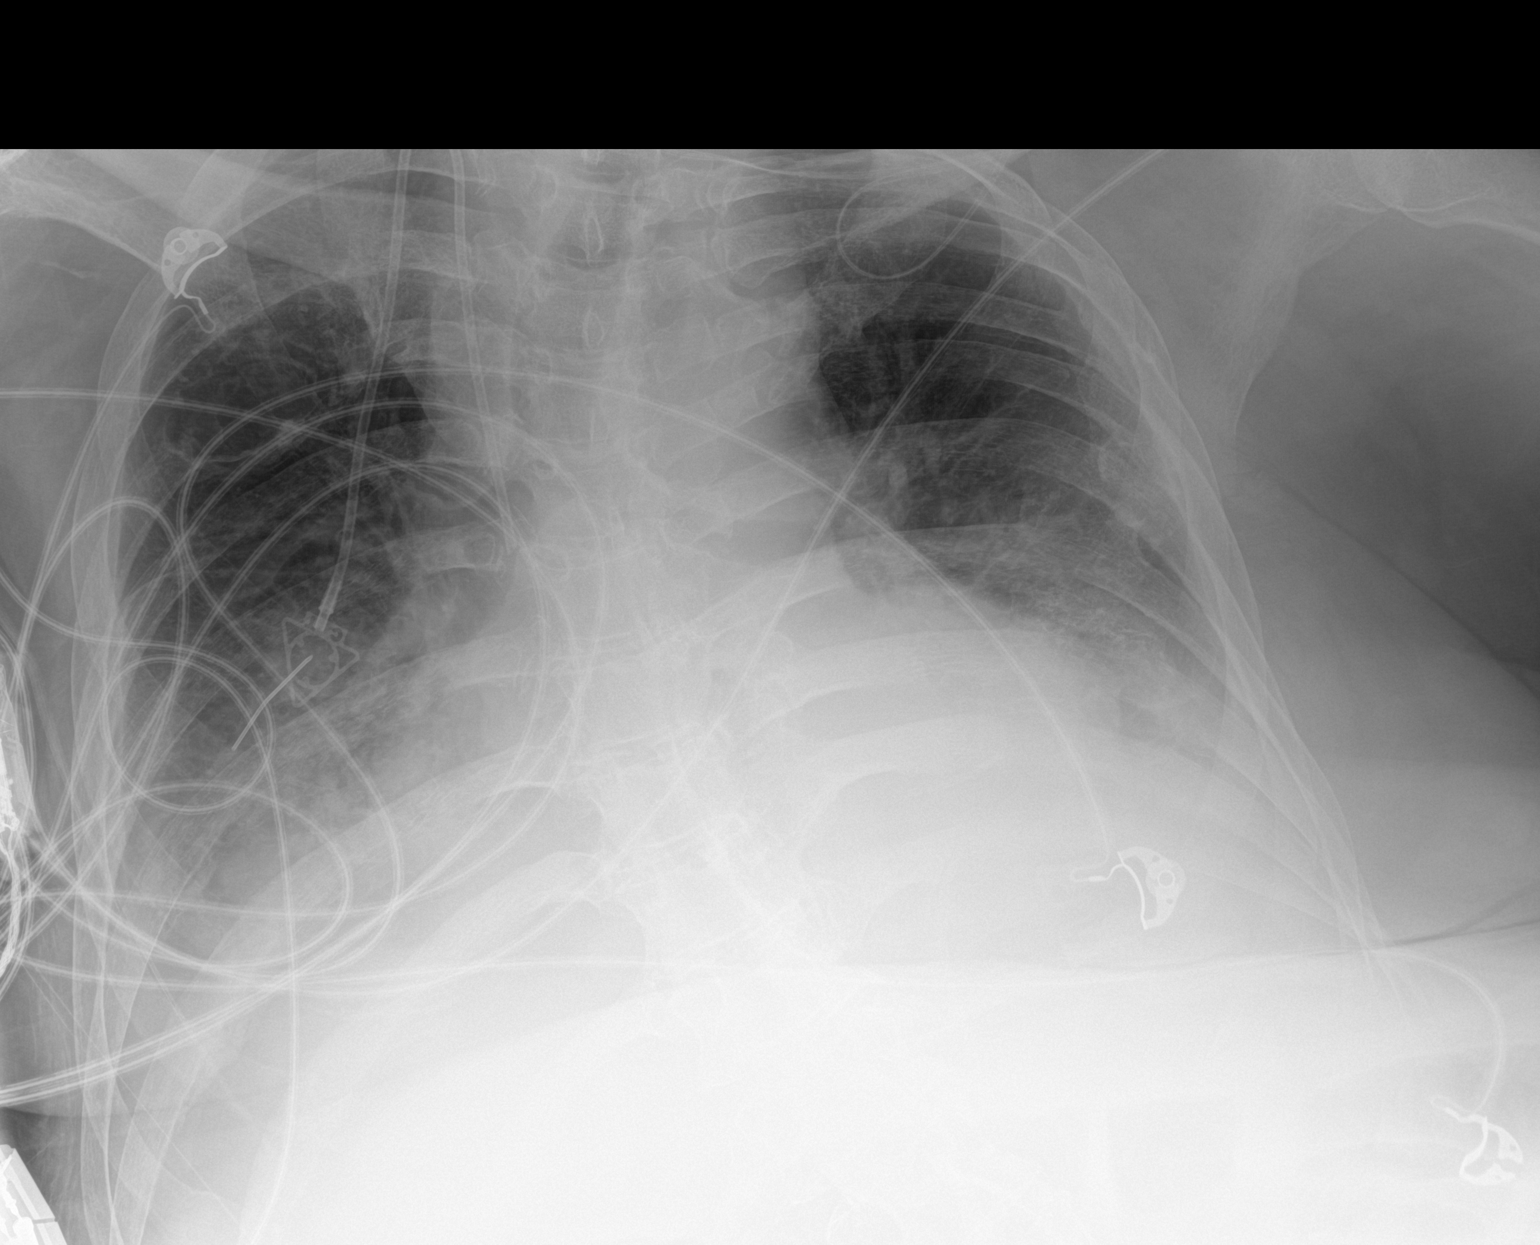

[1 of 1 positions shown; findings below may reference images not displayed]

FINDINGS: Cardiac shadow is enlarged in size. Right-sided chest wall port is
noted. Gastric catheter has been removed in the interval. Bibasilar
atelectasis is noted with evidence of small effusions. The overall
appearance is similar to that seen on the prior exam. No new focal
bony abnormality is noted.
IMPRESSION: Stable bibasilar atelectasis with small effusions.

## 2021-08-12 IMAGING — CT CT ABDOMEN AND PELVIS WITH CONTRAST
2 of 5 series · 16 of 46 positions shown, 18 images · IV contrast (Omni 300)
Comparison: December 13, 2018

CLINICAL DATA: Abdominal distension.

EXAM:
CT ABDOMEN AND PELVIS WITH CONTRAST
TECHNIQUE: Multidetector CT imaging of the abdomen and pelvis was performed
using the standard protocol following bolus administration of
intravenous contrast.
CONTRAST:  100mL OMNIPAQUE IOHEXOL 300 MG/ML  SOLN

[Series 3: a/p w/ 5mm · axial · 0.98mm/px · z∈[+119,+524]mm · 13 of 93 slices shown, 15 images]
[im 6/93  soft-tissue]
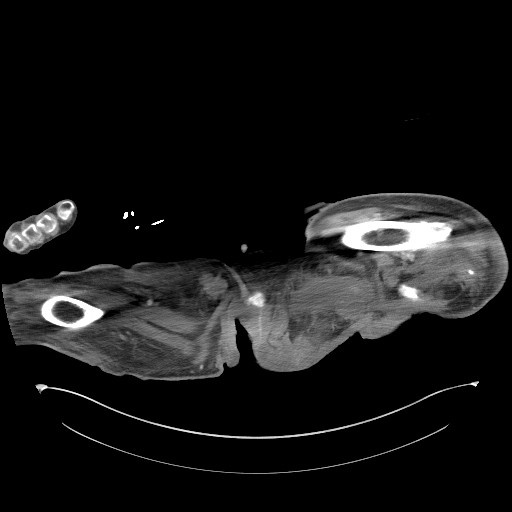
[im 6/93  bone]
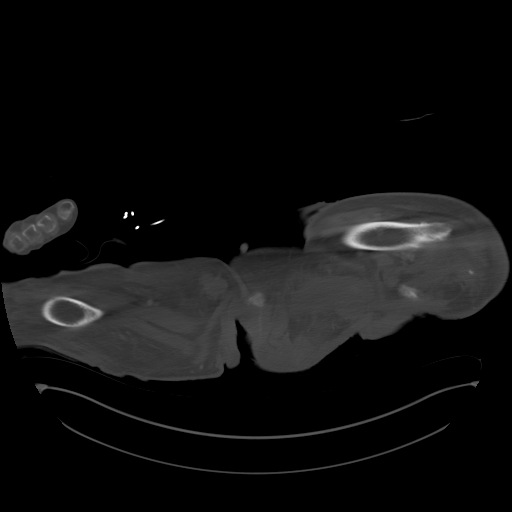
[im 11/93  soft-tissue]
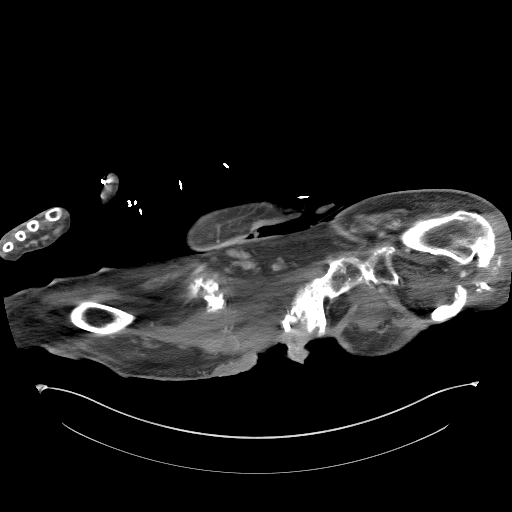
[im 22/93  soft-tissue]
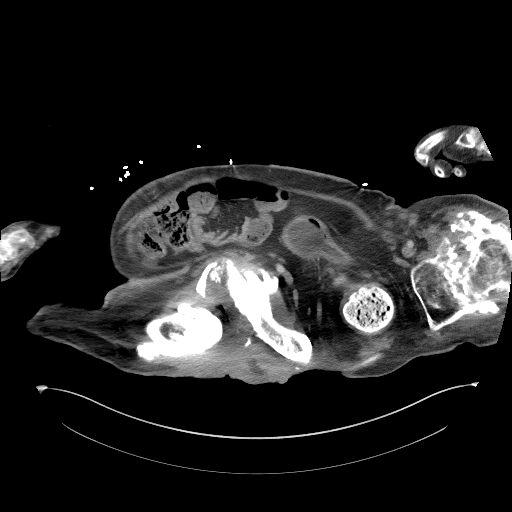
[im 28/93  soft-tissue]
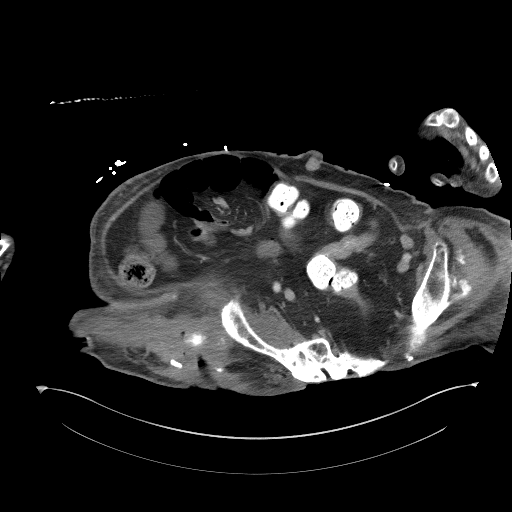
[im 33/93  soft-tissue]
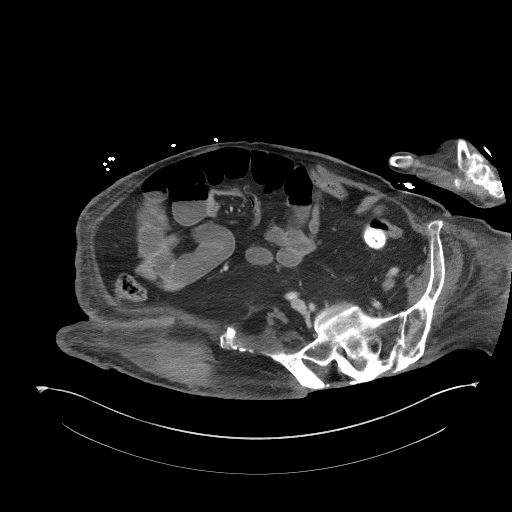
[im 38/93  soft-tissue]
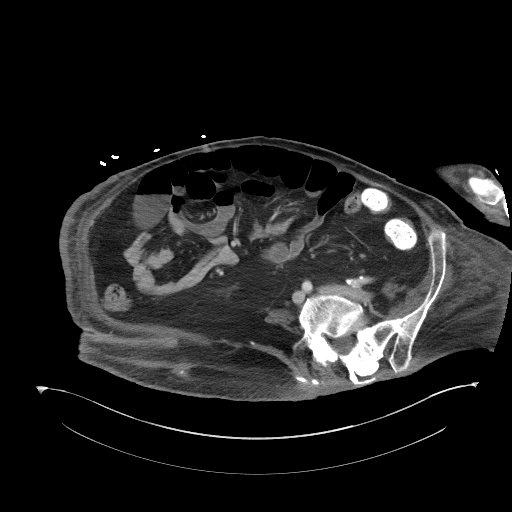
[im 49/93  soft-tissue]
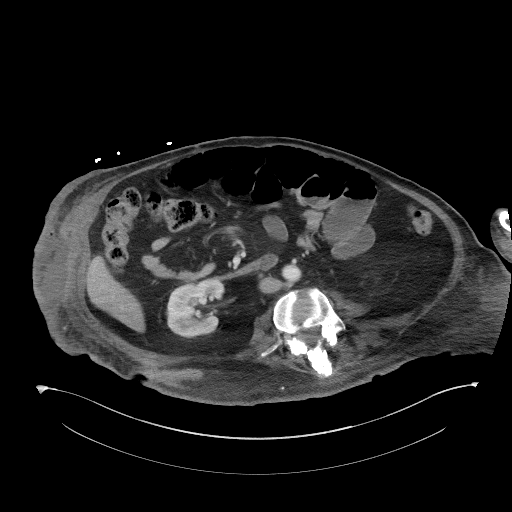
[im 55/93  soft-tissue]
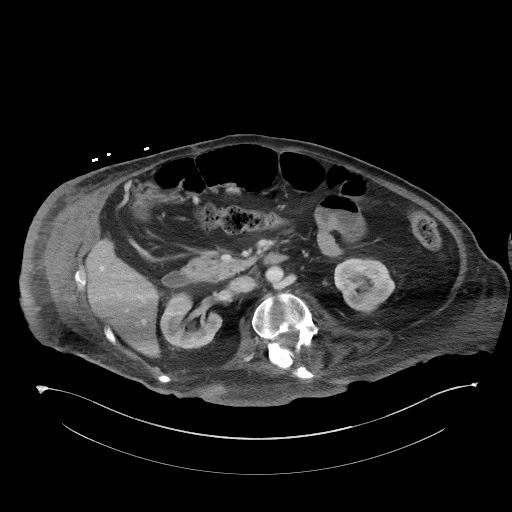
[im 60/93  soft-tissue]
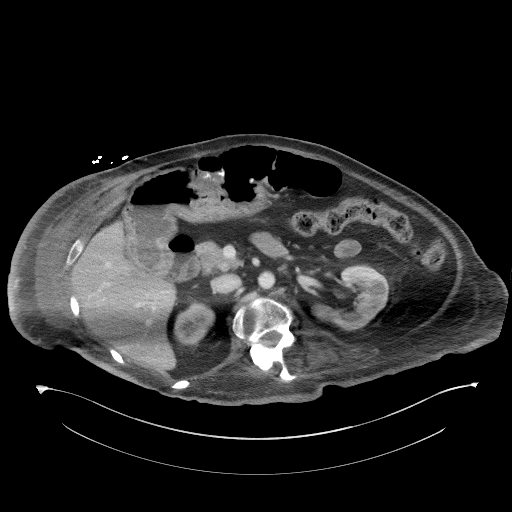
[im 60/93  bone]
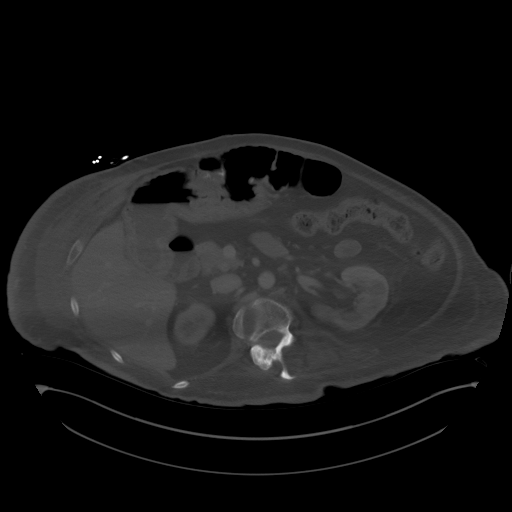
[im 65/93  soft-tissue]
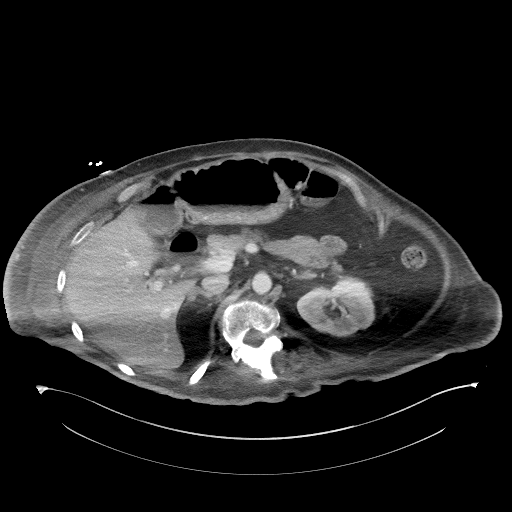
[im 71/93  soft-tissue]
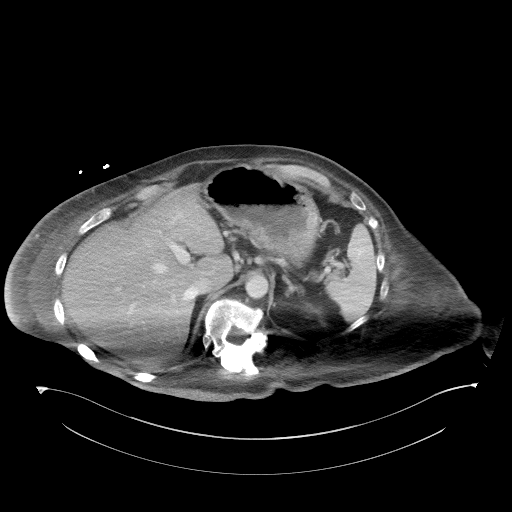
[im 82/93  soft-tissue]
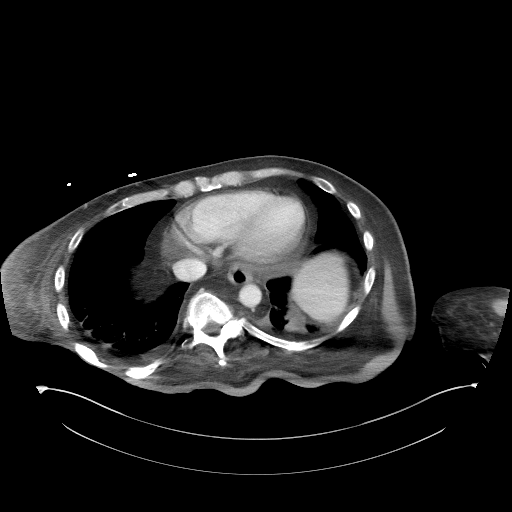
[im 87/93  soft-tissue]
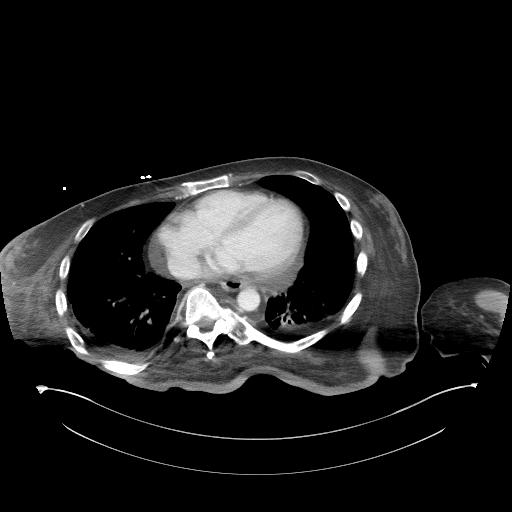

[Series 6: a/p w/ cor · coronal · 0.92mm/px · 3 of 144 slices shown]
[im 48/144  soft-tissue]
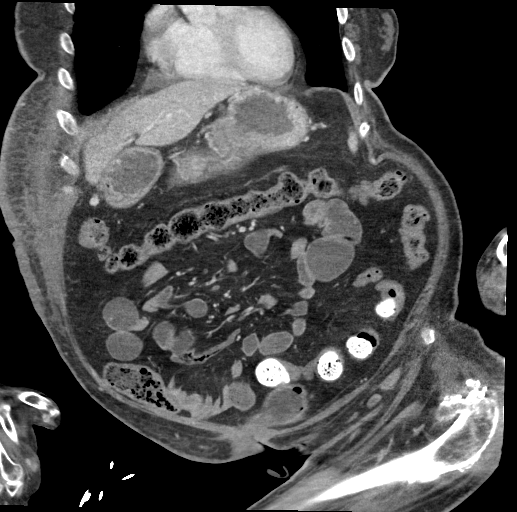
[im 64/144  soft-tissue]
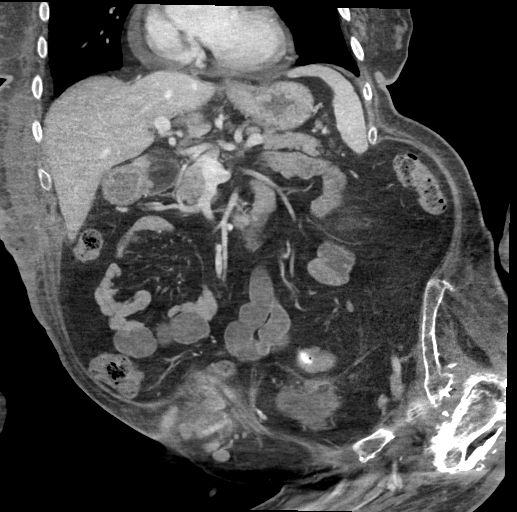
[im 80/144  soft-tissue]
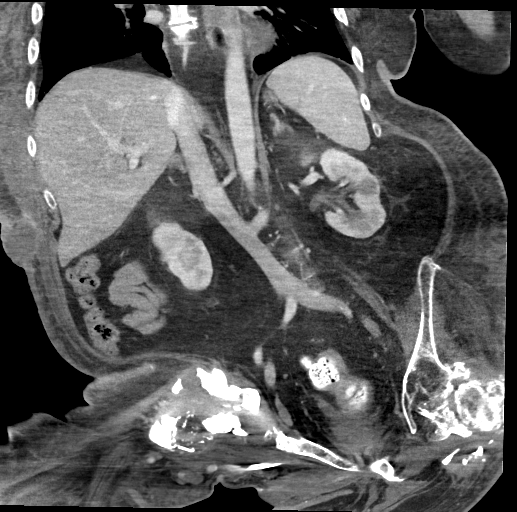

[16 of 46 positions shown; findings below may reference images not displayed]

FINDINGS: Lower chest: There are bibasilar airspace opacities.The heart size
is normal. There is a trace to small pericardial effusion.

Hepatobiliary: The liver is normal. Status post
cholecystectomy.There is no biliary ductal dilation.

Pancreas: Normal contours without ductal dilatation. No
peripancreatic fluid collection.

Spleen: No splenic laceration or hematoma.

Adrenals/Urinary Tract:

--Adrenal glands: No adrenal hemorrhage.

--Right kidney/ureter: No hydronephrosis or perinephric hematoma.

--Left kidney/ureter: No hydronephrosis or perinephric hematoma.

--Urinary bladder: The bladder is decompressed with a suprapubic
catheter

Stomach/Bowel:

--Stomach/Duodenum: Postsurgical changes are again noted of the
stomach.

--Small bowel: There are few mildly dilated loops of small bowel
without evidence for discrete transition point.

--Colon: There is an end colostomy in the left lower quadrant. There
is no evidence for a large bowel obstruction.

--Appendix: The appendix is not reliably identified.

Vascular/Lymphatic: There is a retroaortic left renal vein, a normal
variant. There is no abdominal aortic aneurysm

--No retroperitoneal lymphadenopathy.

--No mesenteric lymphadenopathy.

--there is some mildly enlarged pelvic lymph nodes bilaterally.

Reproductive: Unremarkable

Other: No ascites or free air. The abdominal wall is normal.

Musculoskeletal. Again noted are chronic changes involving the
pelvic bones bilaterally. There is a large decubitus ulcer overlying
the proximal right femur. There is sclerosis of the visualized
proximal right femur which is stable from prior study. There is
sclerosis of the right iliac bone, similar to prior study.

There is a 3.3 by 7.1 cm air and fluid collection along the
patient's right flank. There is significant overlying skin
thickening. The collection is approximately 1.3 cm deep to the skin
surface. There is surrounding fat stranding and edema.
IMPRESSION: 1. There is a 7.1 cm air in fluid collection along the patient's
right flank concerning for an abscess. There is associated skin
thickening which may be reactive or secondary to cellulitis.
2. Bibasilar airspace opacities which may represent chronic
atelectasis, aspiration, or developing infiltrates. This is overall
similar to prior CT dated December 13, 2018.
3. Stable appearance of the pelvic bones with a persistent large
right-sided decubitus ulcer as detailed above.

## 2021-08-17 IMAGING — DX PORTABLE CHEST - 1 VIEW
2 series · 2 of 2 positions shown · non-contrast
Comparison: 12/13/2018

CLINICAL DATA: Shortness of breath

EXAM:
PORTABLE CHEST 1 VIEW

[chest ap (1 of 2)]
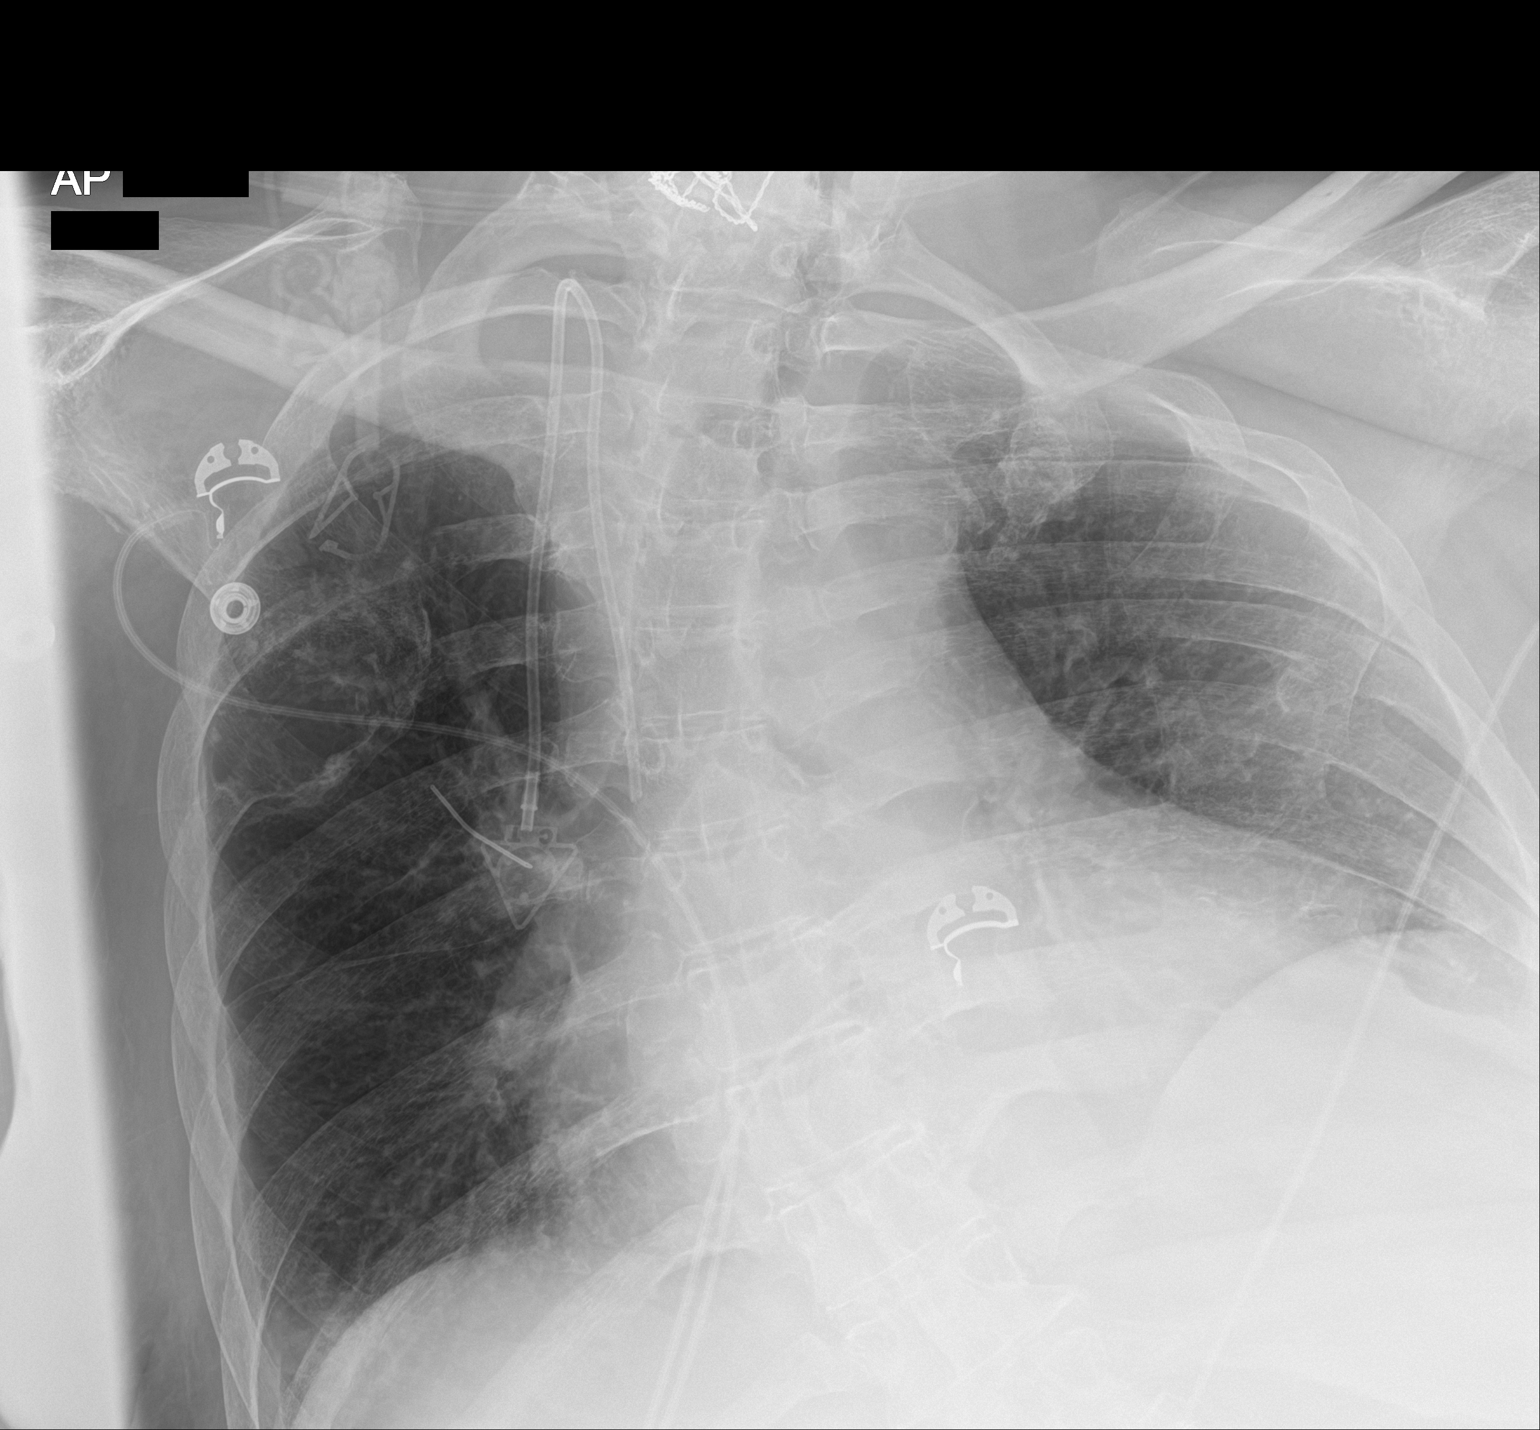

[chest ap (2 of 2)]
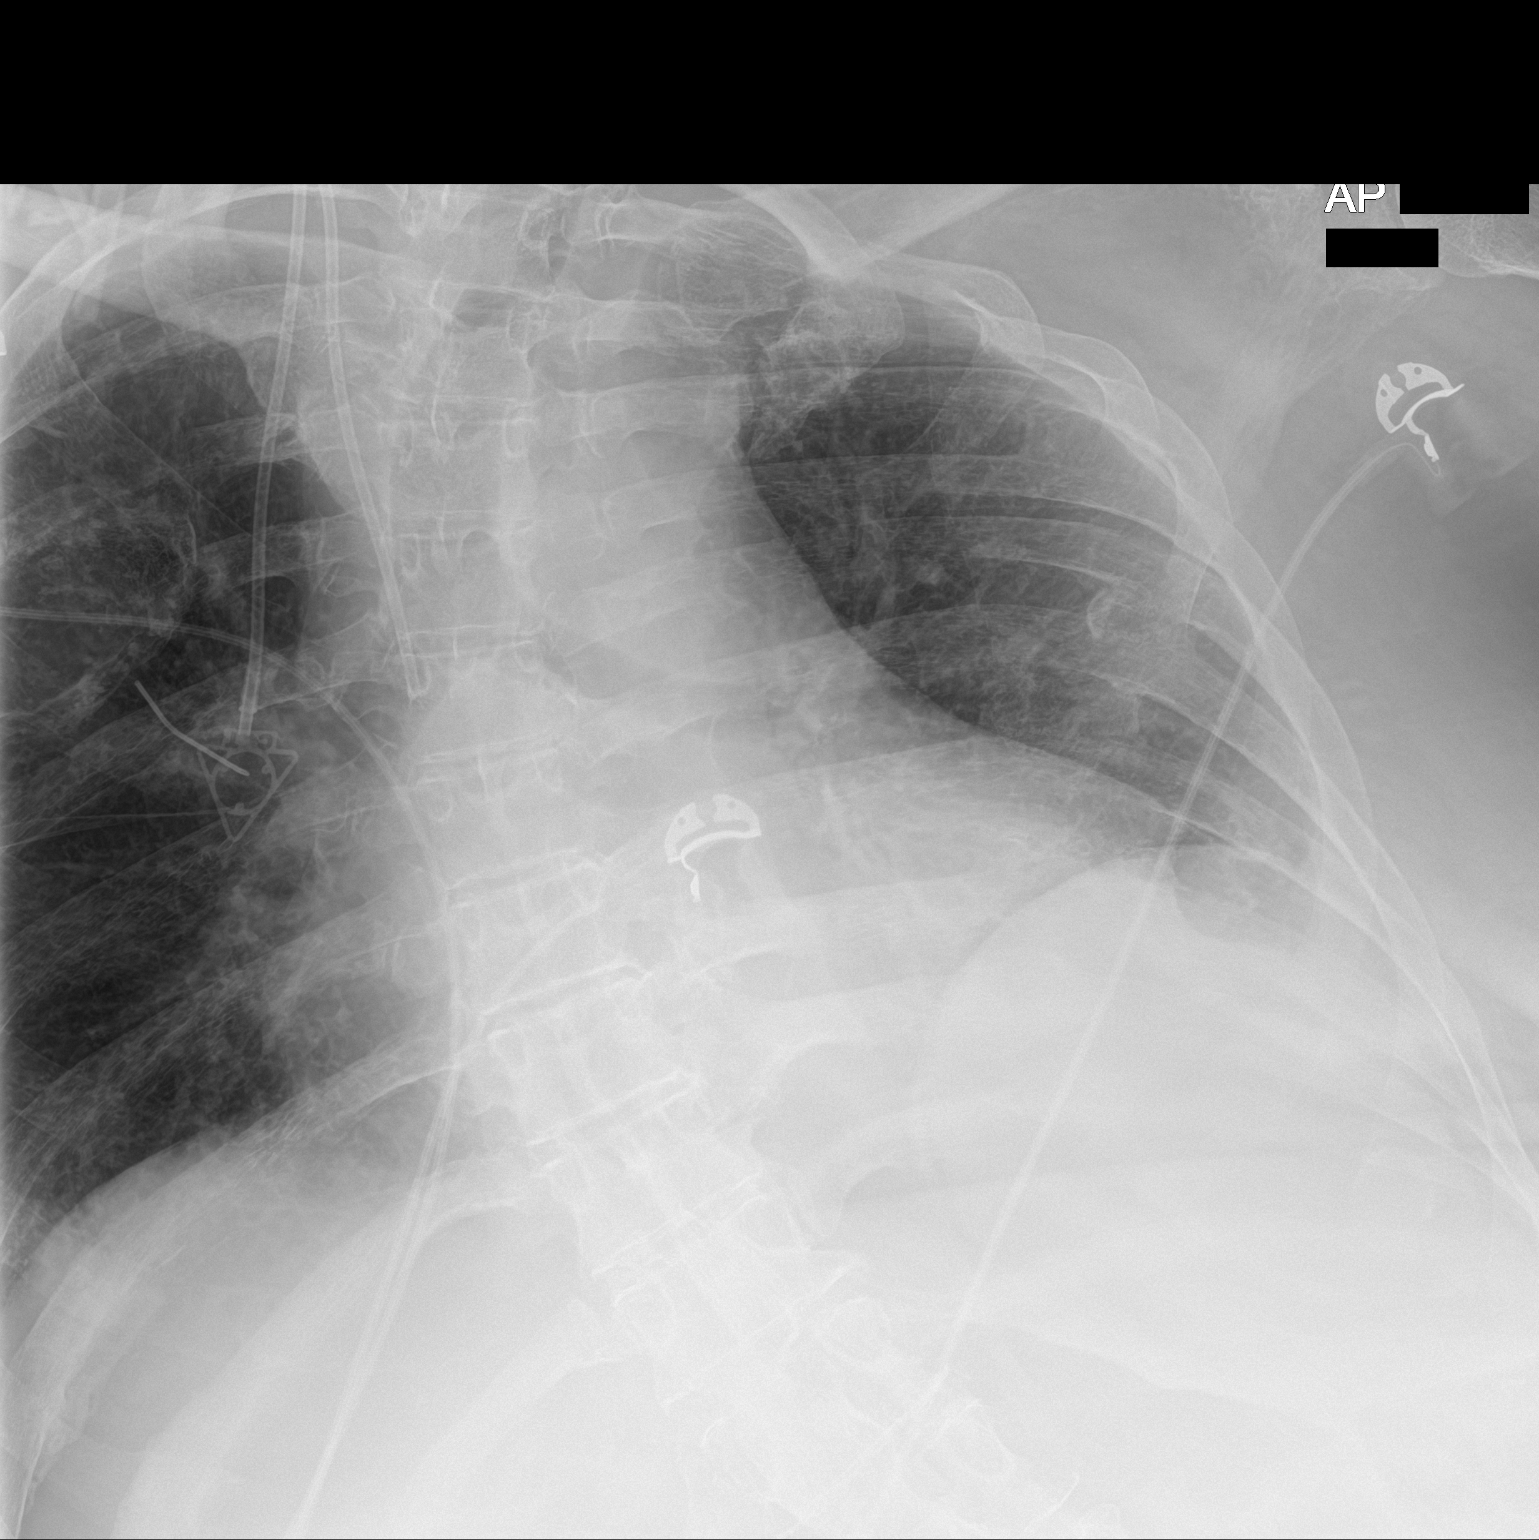

[2 of 2 positions shown; findings below may reference images not displayed]

FINDINGS: Right Port-A-Cath remains in place, unchanged. Cardiomegaly with
vascular congestion. No confluent opacities, effusions or overt
edema. No acute bony abnormality.
IMPRESSION: Cardiomegaly, vascular congestion.

## 2021-09-05 IMAGING — CT CT ABD-PELV W/ CM
2 of 5 series · 16 of 46 positions shown, 18 images · IV contrast (omnipaque)
Comparison: 12/29/2018

CLINICAL DATA: Abdominal pain, distention, and nausea and vomiting.
History of bowel obstructions. Quadriplegic.

EXAM:
CT ABDOMEN AND PELVIS WITH CONTRAST
TECHNIQUE: Multidetector CT imaging of the abdomen and pelvis was performed
using the standard protocol following bolus administration of
intravenous contrast.
CONTRAST:  100mL OMNIPAQUE IOHEXOL 300 MG/ML  SOLN

[Series 3: abd/ pelvis 5.0 i30f 2 · axial · 0.92mm/px · z∈[+885,+1270]mm · 13 of 89 slices shown, 15 images]
[im 6/89  soft-tissue]
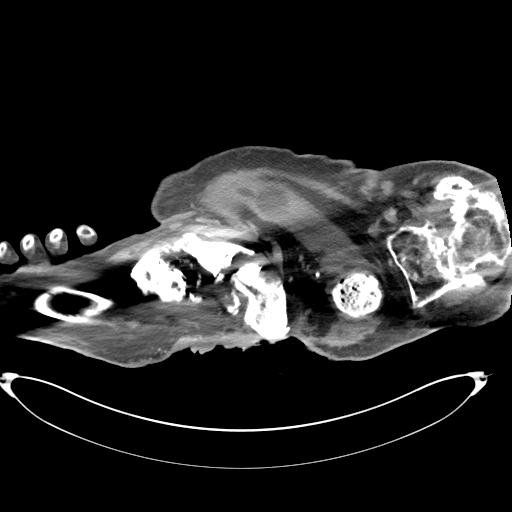
[im 6/89  bone]
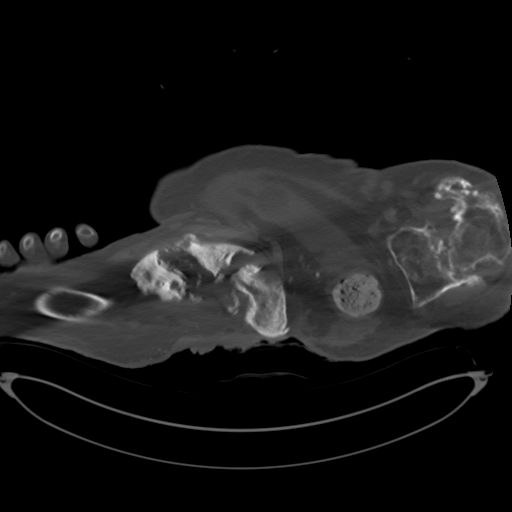
[im 12/89  soft-tissue]
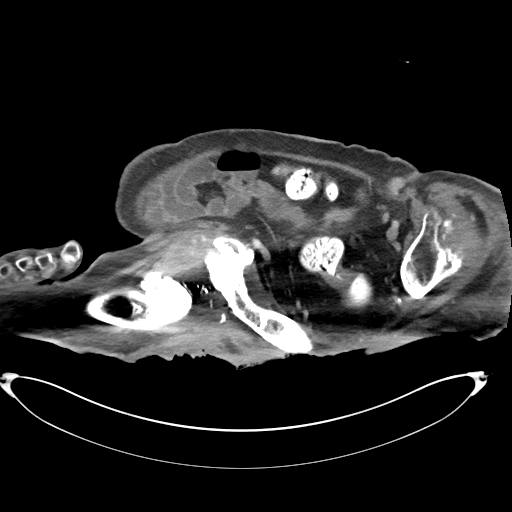
[im 18/89  soft-tissue]
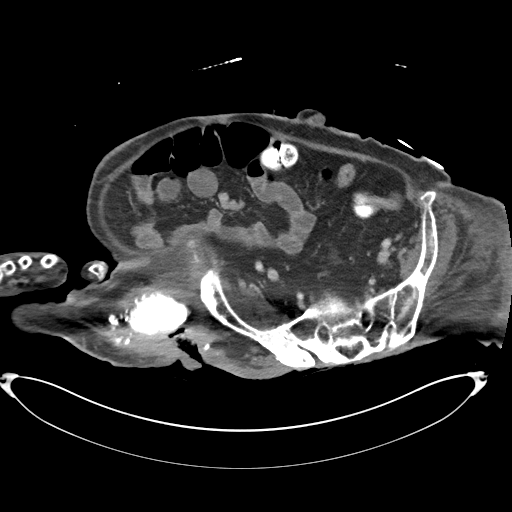
[im 24/89  soft-tissue]
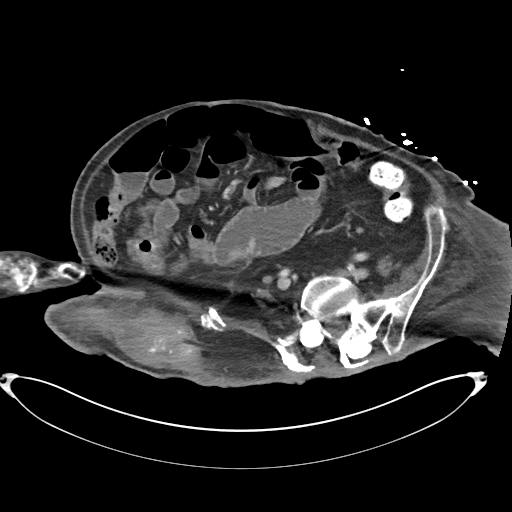
[im 30/89  soft-tissue]
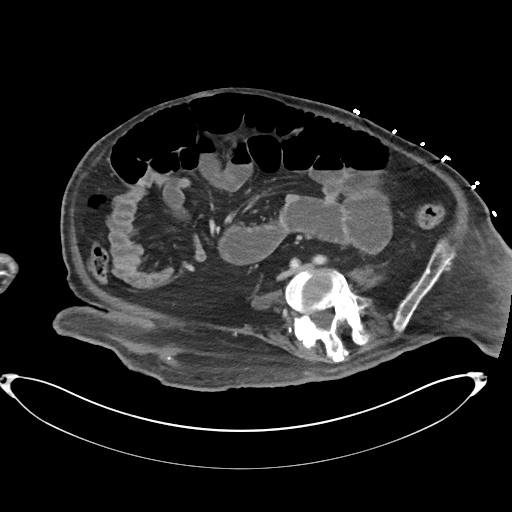
[im 36/89  soft-tissue]
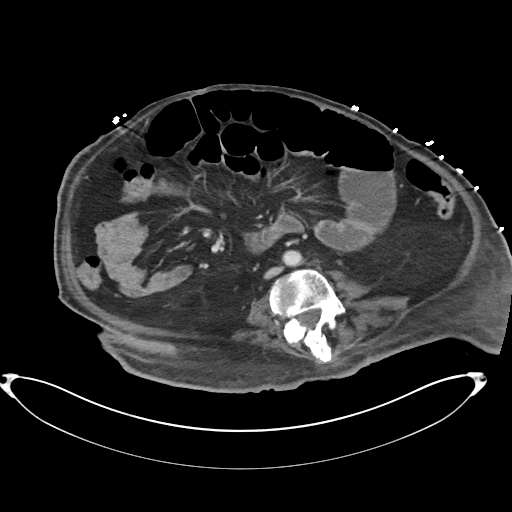
[im 47/89  soft-tissue]
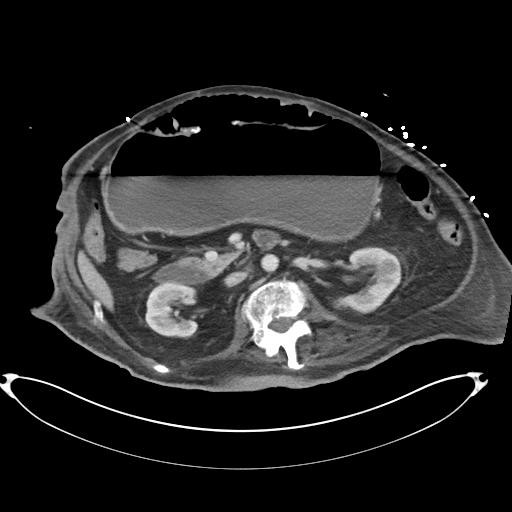
[im 53/89  soft-tissue]
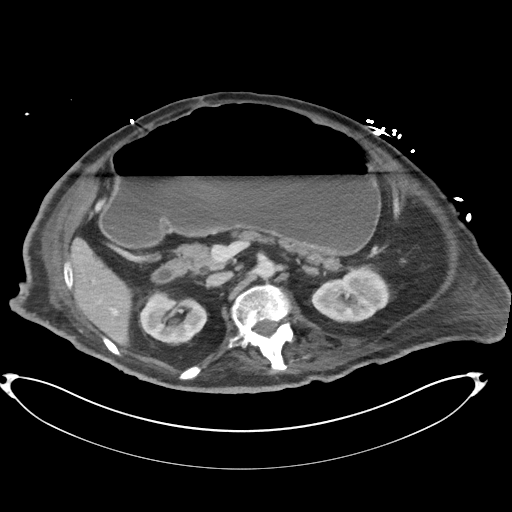
[im 59/89  soft-tissue]
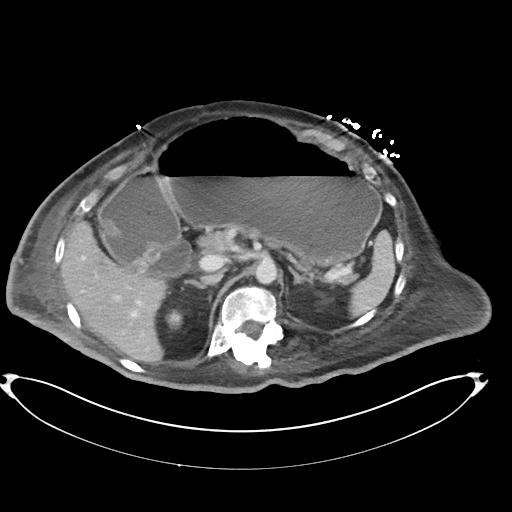
[im 59/89  bone]
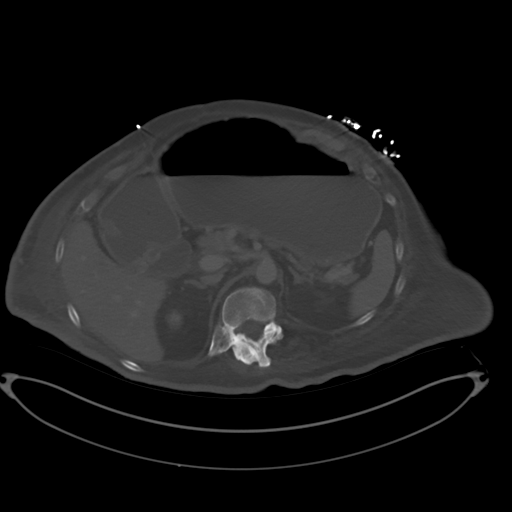
[im 65/89  soft-tissue]
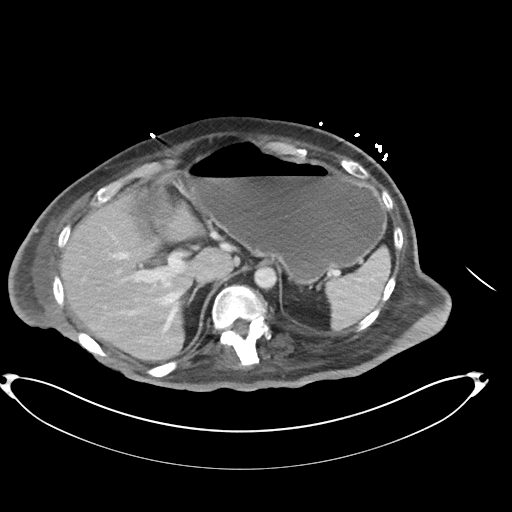
[im 71/89  soft-tissue]
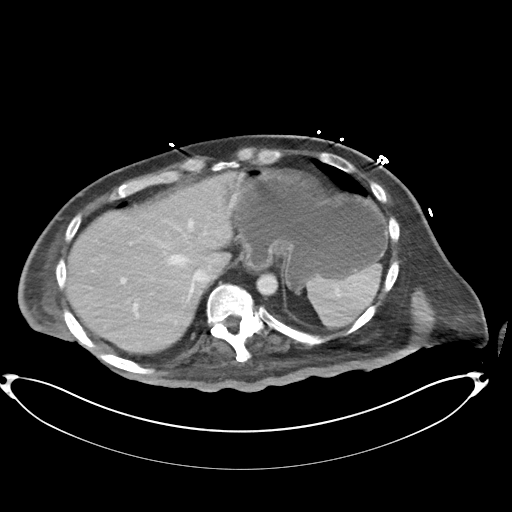
[im 77/89  soft-tissue]
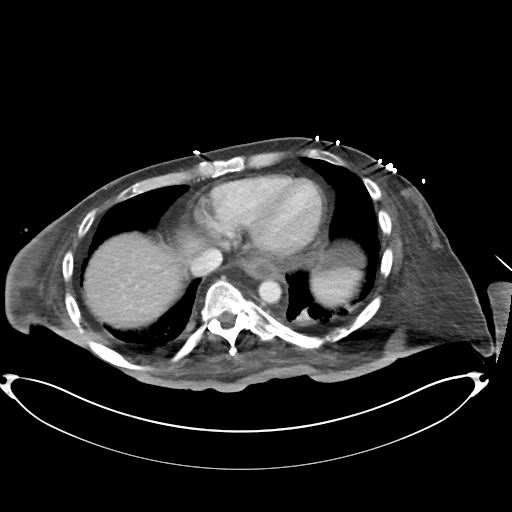
[im 83/89  soft-tissue]
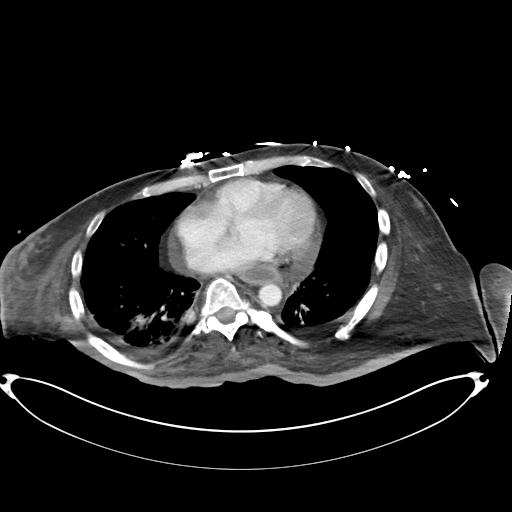

[Series 6: coronal soft tissue · coronal · 0.88mm/px · 3 of 93 slices shown]
[im 31/93  soft-tissue]
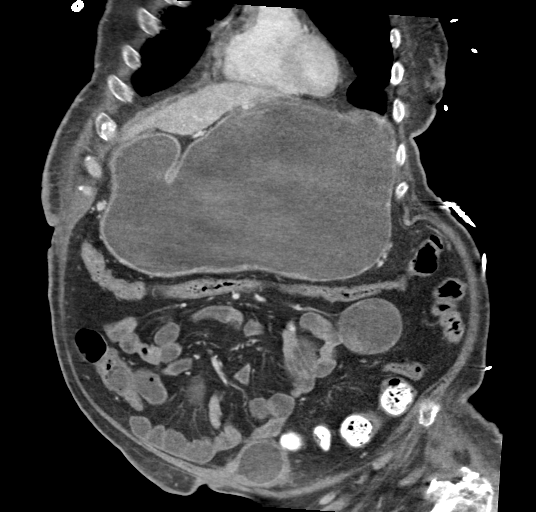
[im 41/93  soft-tissue]
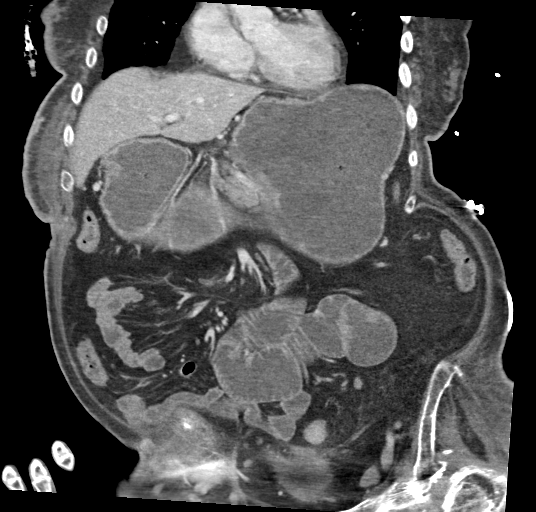
[im 52/93  soft-tissue]
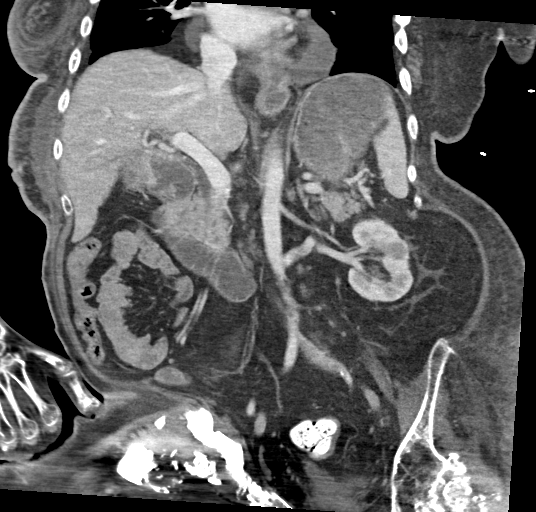

[16 of 46 positions shown; findings below may reference images not displayed]

FINDINGS: Lower Chest: No acute findings. Stable chronic bibasilar pleural
thickening and parenchymal scarring.

Hepatobiliary: No hepatic masses identified. Prior cholecystectomy.
No evidence of biliary obstruction.

Pancreas:  No mass or inflammatory changes.

Spleen: Within normal limits in size and appearance.

Adrenals/Urinary Tract: No masses identified. No evidence of
hydronephrosis. Foley catheter is seen within the urinary bladder
which is nearly empty.

Stomach/Bowel: Stomach is markedly dilated. Moderate dilatation of
proximal small bowel loops are also seen containing air-fluid
levels. This is increased since previous study. Transition point is
seen in the central abdominal small bowel mesentery, with nondilated
distal small bowel. This is consistent with a small-bowel
obstruction and is suspicious for adhesion. No evidence of
inflammatory process, abnormal fluid collections, or pneumatosis.
Left lower quadrant colostomy again seen.

Vascular/Lymphatic: Mildly enlarged bilateral external iliac lymph
nodes are seen, measuring 13 mm on the right (image 74/3) and 17 mm
on the left (image 75/3). This is increased since previous study. No
other pathologically enlarged lymph nodes identified.

Reproductive:  No mass or other significant abnormality.

Other:  None.

Musculoskeletal: Bilateral decubitus ulcers and changes of chronic
osteomyelitis again seen involving the pelvis. No signs of acute
osteomyelitis identified. Chronic bilateral hip dislocations are
again noted.
IMPRESSION: 1. High-grade mid small bowel obstruction, with transition point in
central abdominal mesentery, suspicious for adhesion.
2. Increased mildly enlarged bilateral external iliac lymph nodes.
These are nonspecific and may be reactive in etiology given findings
of chronic decubitus ulcers and pelvic osteomyelitis. Recommend
continued follow-up by CT in 3-6 months.

## 2021-09-05 IMAGING — DX DG ABD PORTABLE 1V
2 series · 2 of 2 positions shown · non-contrast
Comparison: January 22, 2019 CT

CLINICAL DATA: NG tube placement

EXAM:
PORTABLE ABDOMEN - 1 VIEW

[abdomen kub (1 of 2)]
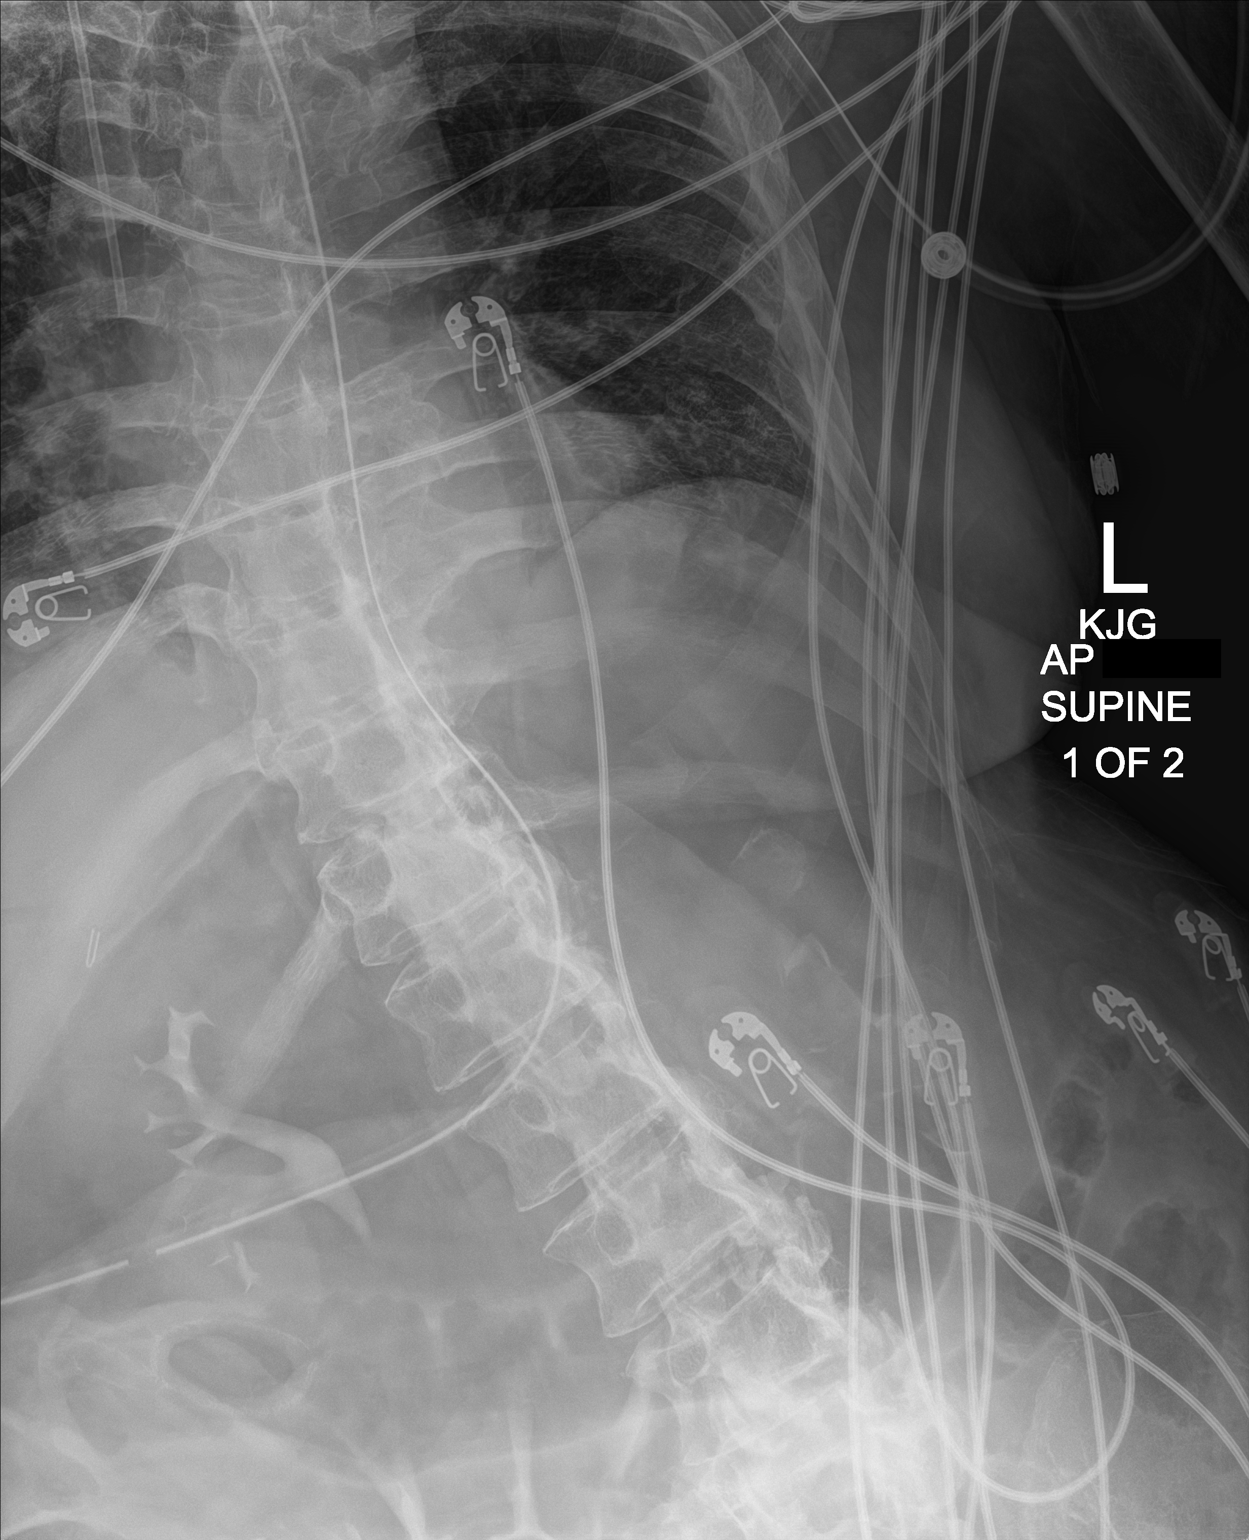

[abdomen kub (2 of 2)]
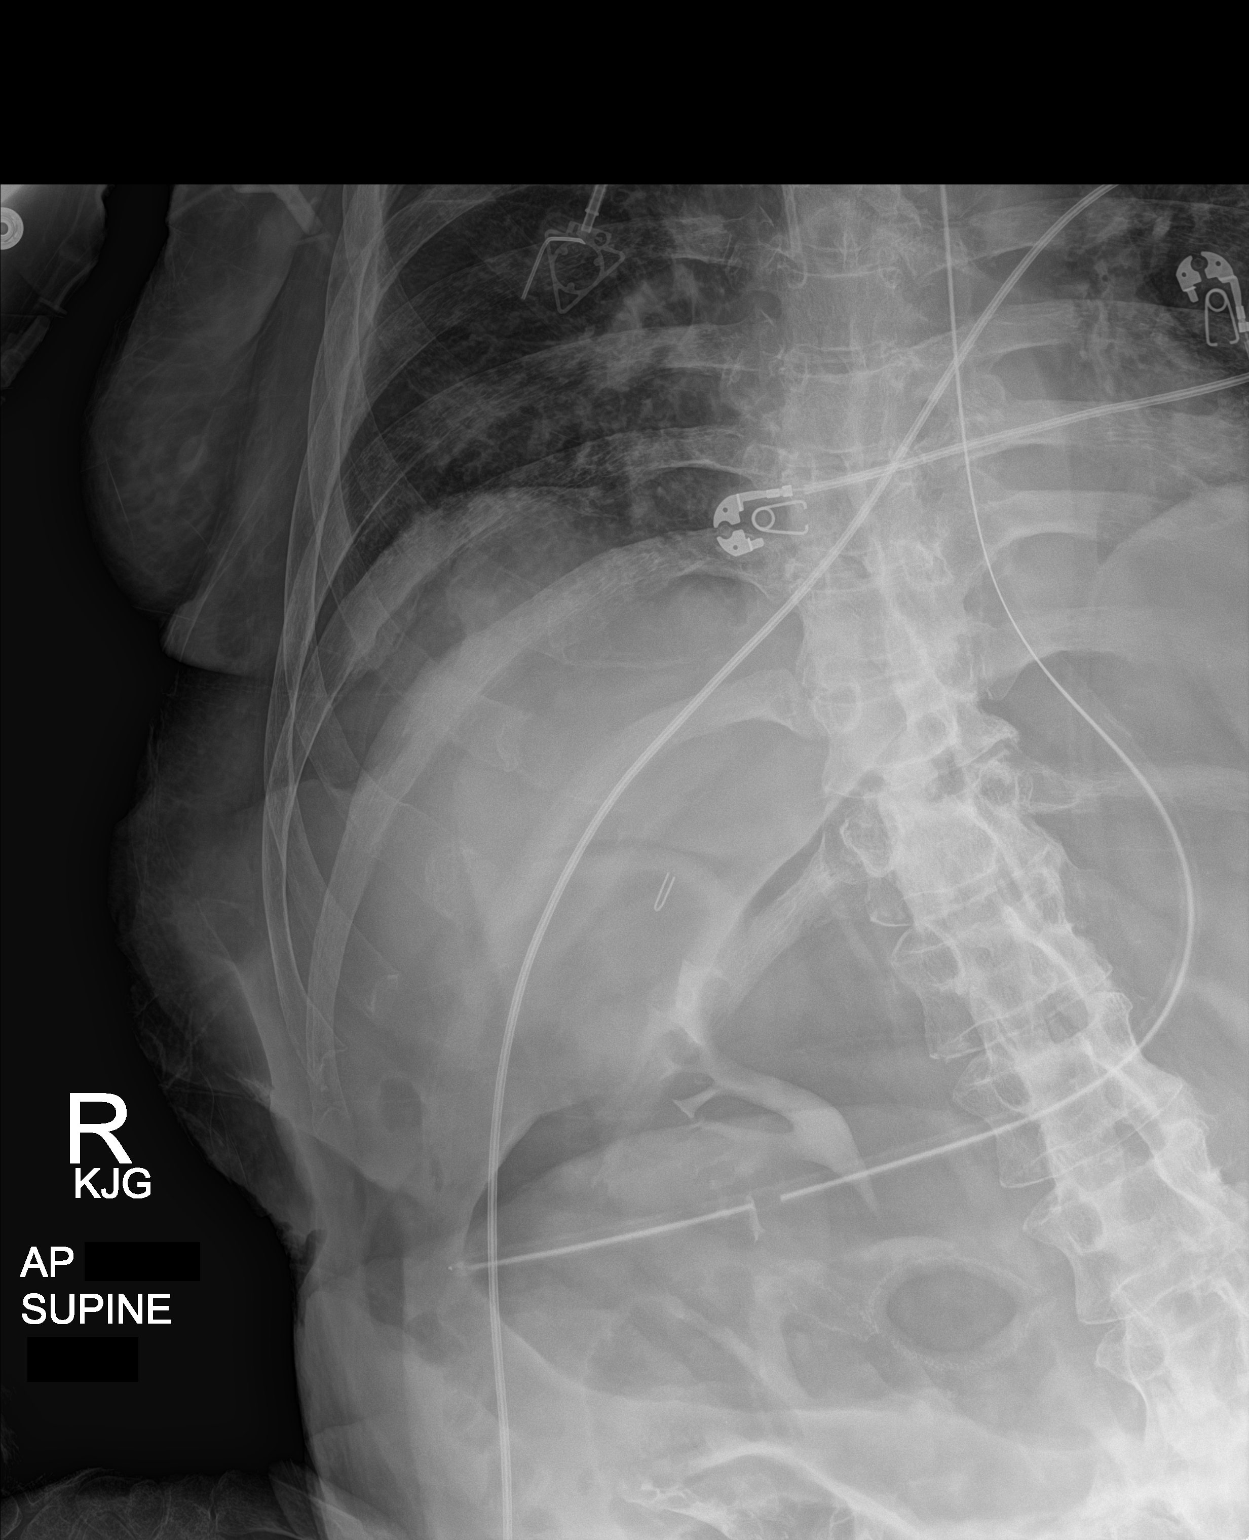

[2 of 2 positions shown; findings below may reference images not displayed]

FINDINGS: Tip of the NG tube is seen projecting over the distal stomach.
Contrast seen within the right renal pelvis. Air seen in a dilated
gastric bubble.
IMPRESSION: Tip of NG tube seen projecting over the distal stomach.

## 2021-09-06 IMAGING — DX DG ABD PORTABLE 1V
1 series · 1 of 1 positions shown · non-contrast
Comparison: 01/22/2019

CLINICAL DATA: Follow-up small-bowel follow-through

EXAM:
PORTABLE ABDOMEN - 1 VIEW

[abdomen]
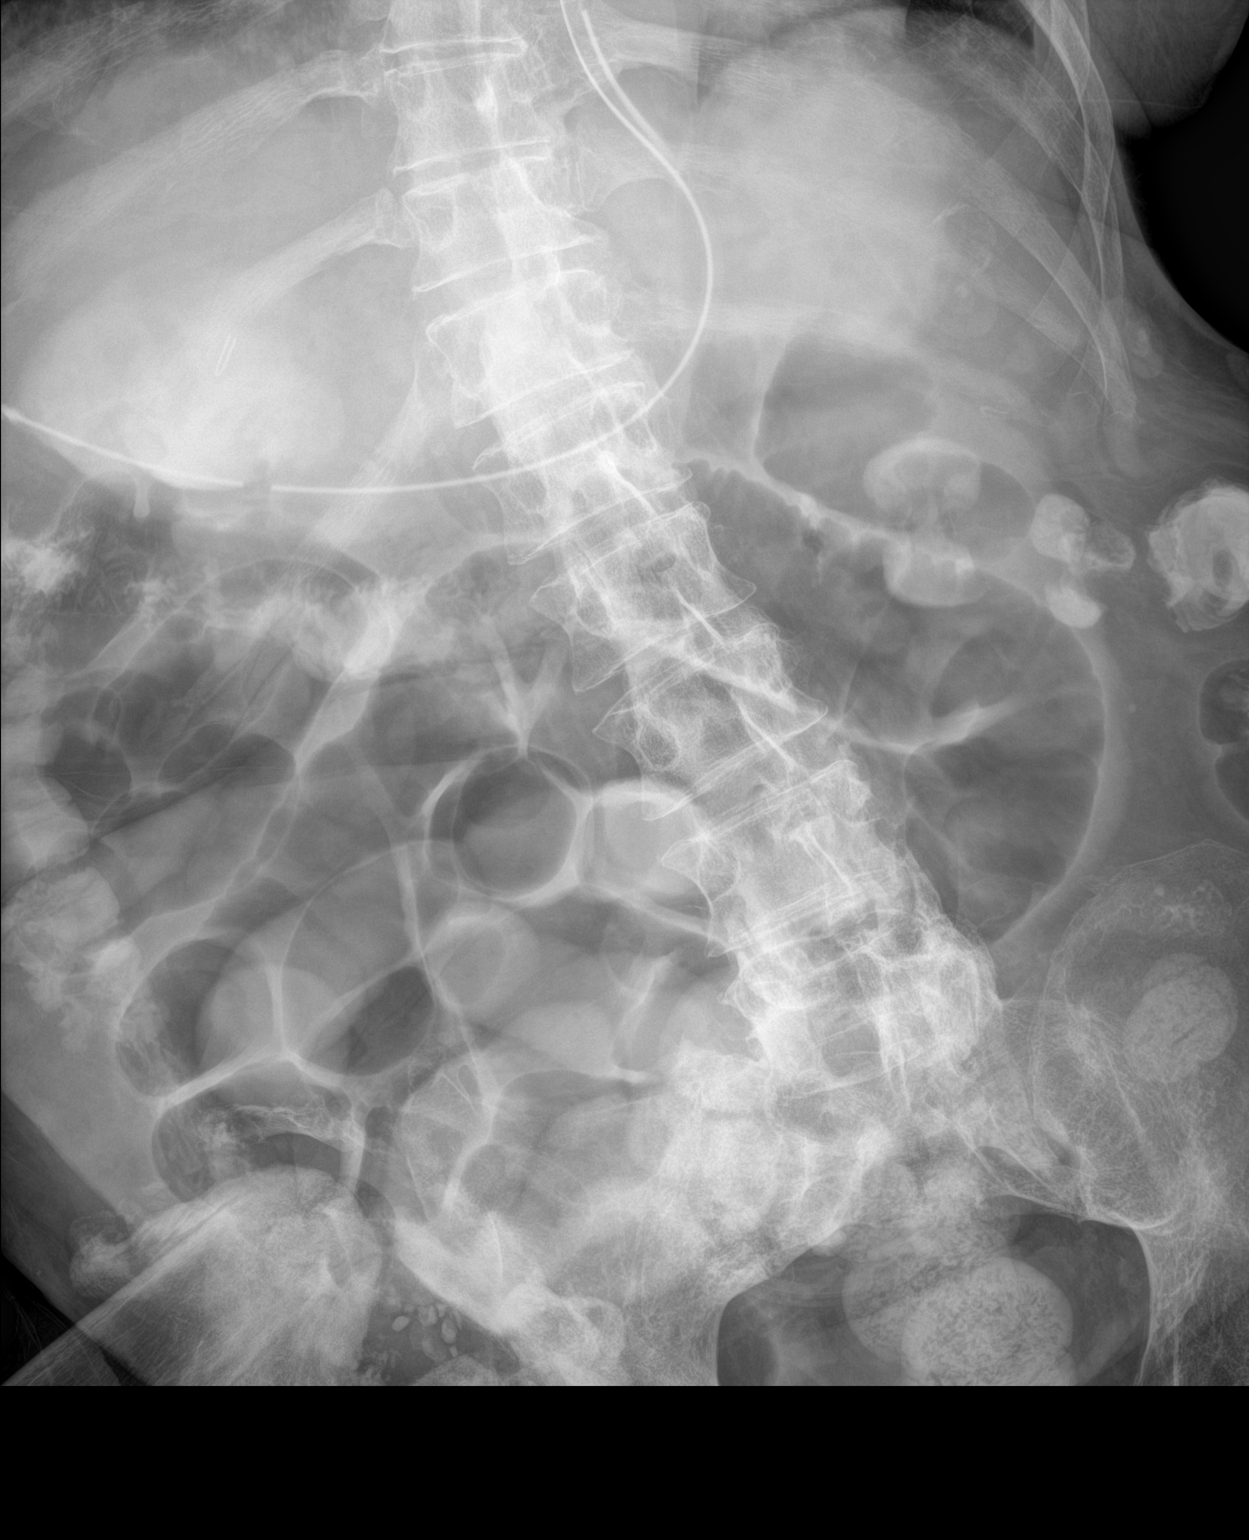

[1 of 1 positions shown; findings below may reference images not displayed]

FINDINGS: Nasogastric catheter is noted within the distal stomach. Previously
administered contrast now lies within the colon and distal small
bowel. Small bowel dilatation remains. No free air is noted.
IMPRESSION: Some of the administered contrast now lies within the colon and some
within the distal small bowel consistent with a partial small bowel
obstruction.

## 2021-09-09 IMAGING — DX DG ABDOMEN 1V
1 series · 2 of 2 positions shown · non-contrast
Comparison: 01/24/2019

CLINICAL DATA: Abdominal distension/discomfort.

EXAM:
ABDOMEN - 1 VIEW

[Series 1: abdomen · 0.14mm/px · 2 of 2 slices shown]
[im 1/2]
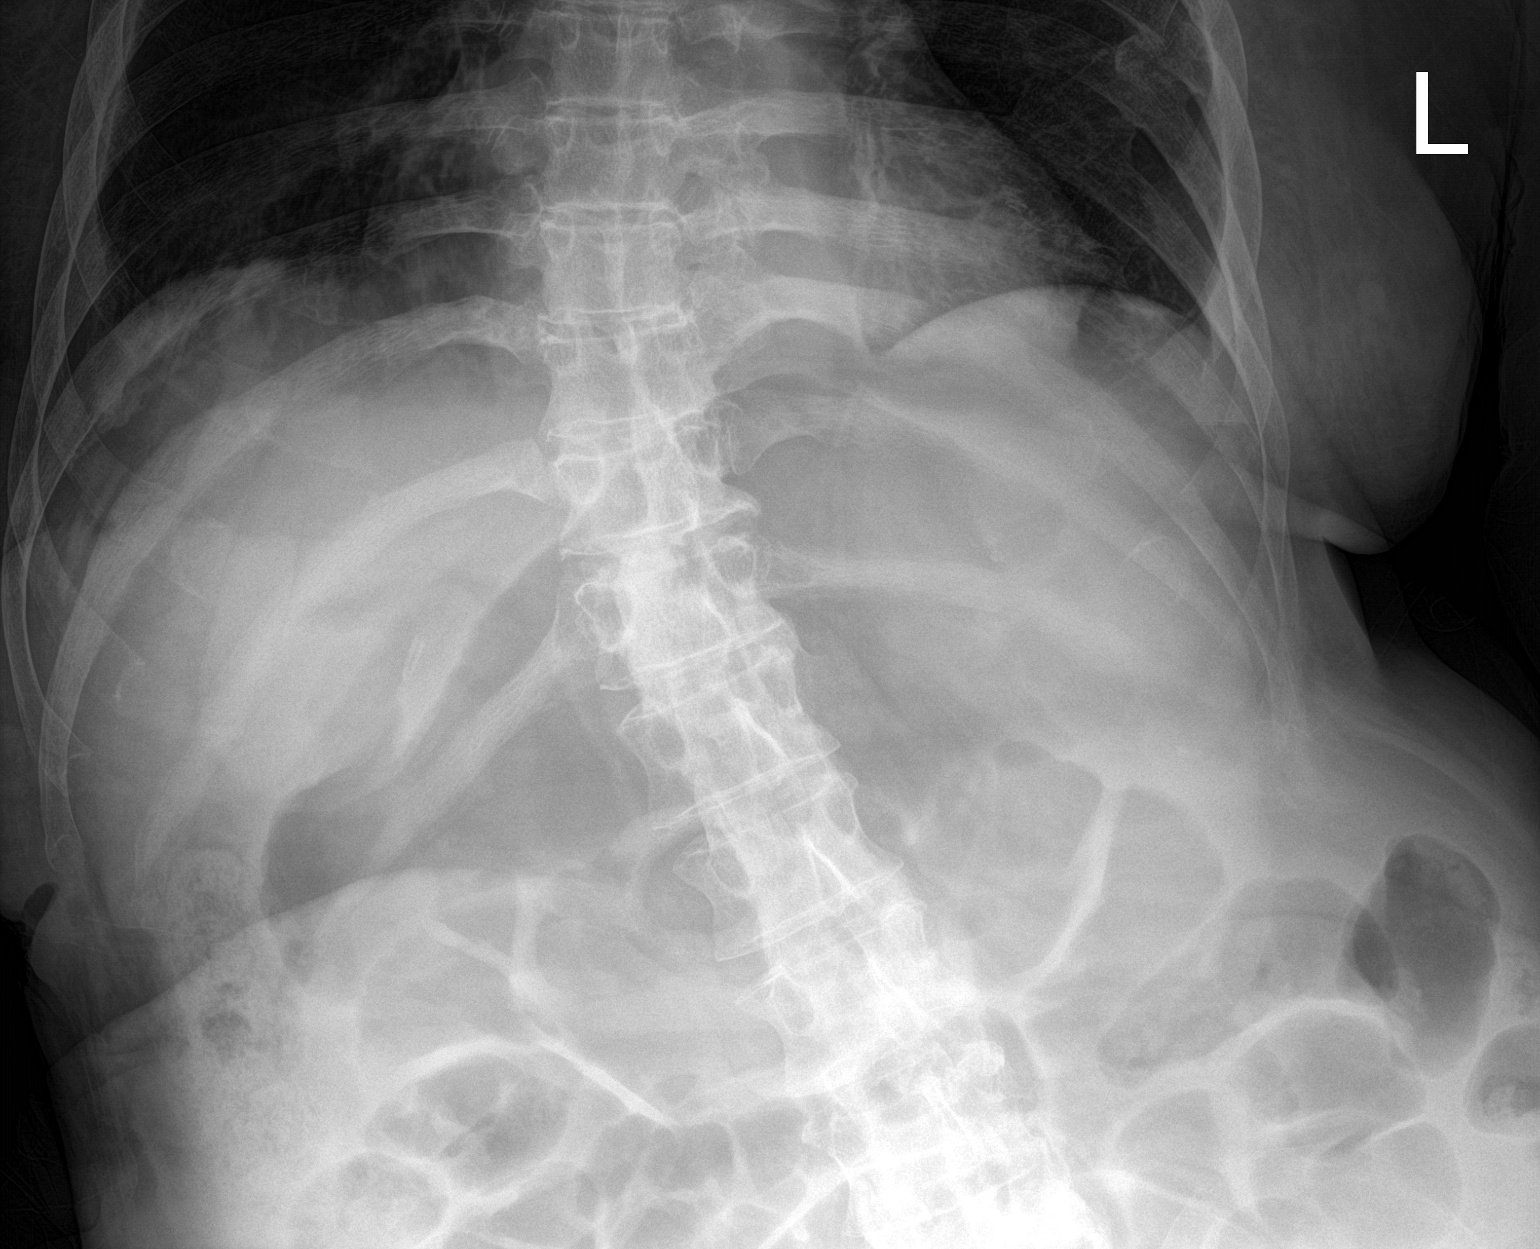
[im 2/2]
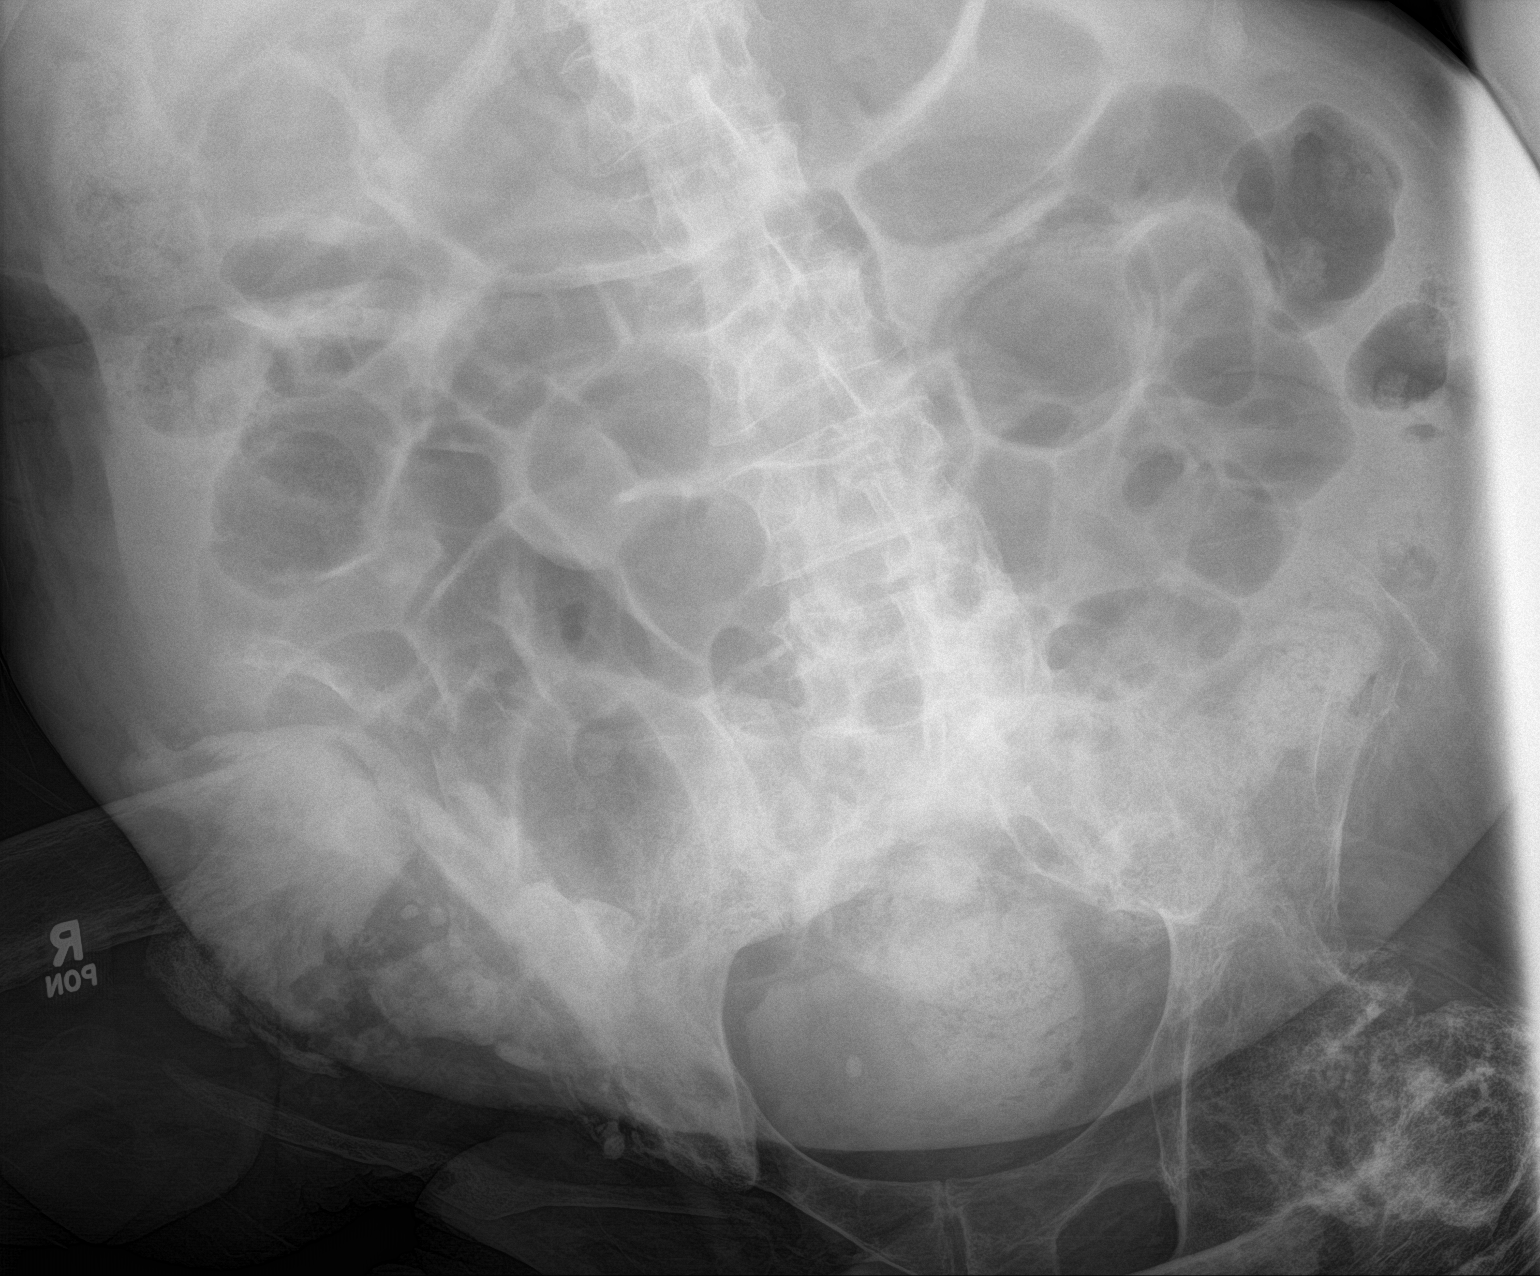

[2 of 2 positions shown; findings below may reference images not displayed]

FINDINGS: Bowel gas pattern demonstrates persistent air-filled dilated small
bowel loops. Air and stool throughout the colon. No free peritoneal
air. Mild gastric distension. Remainder of the exam is unchanged.
IMPRESSION: No significant change partial small bowel obstruction.

## 2021-09-10 IMAGING — DX DG ABDOMEN 1V
2 series · 2 of 2 positions shown · non-contrast
Comparison: 01/26/2019

CLINICAL DATA: Small bowel obstruction.

EXAM:
ABDOMEN - 1 VIEW

[abdomen kub (1 of 2)]
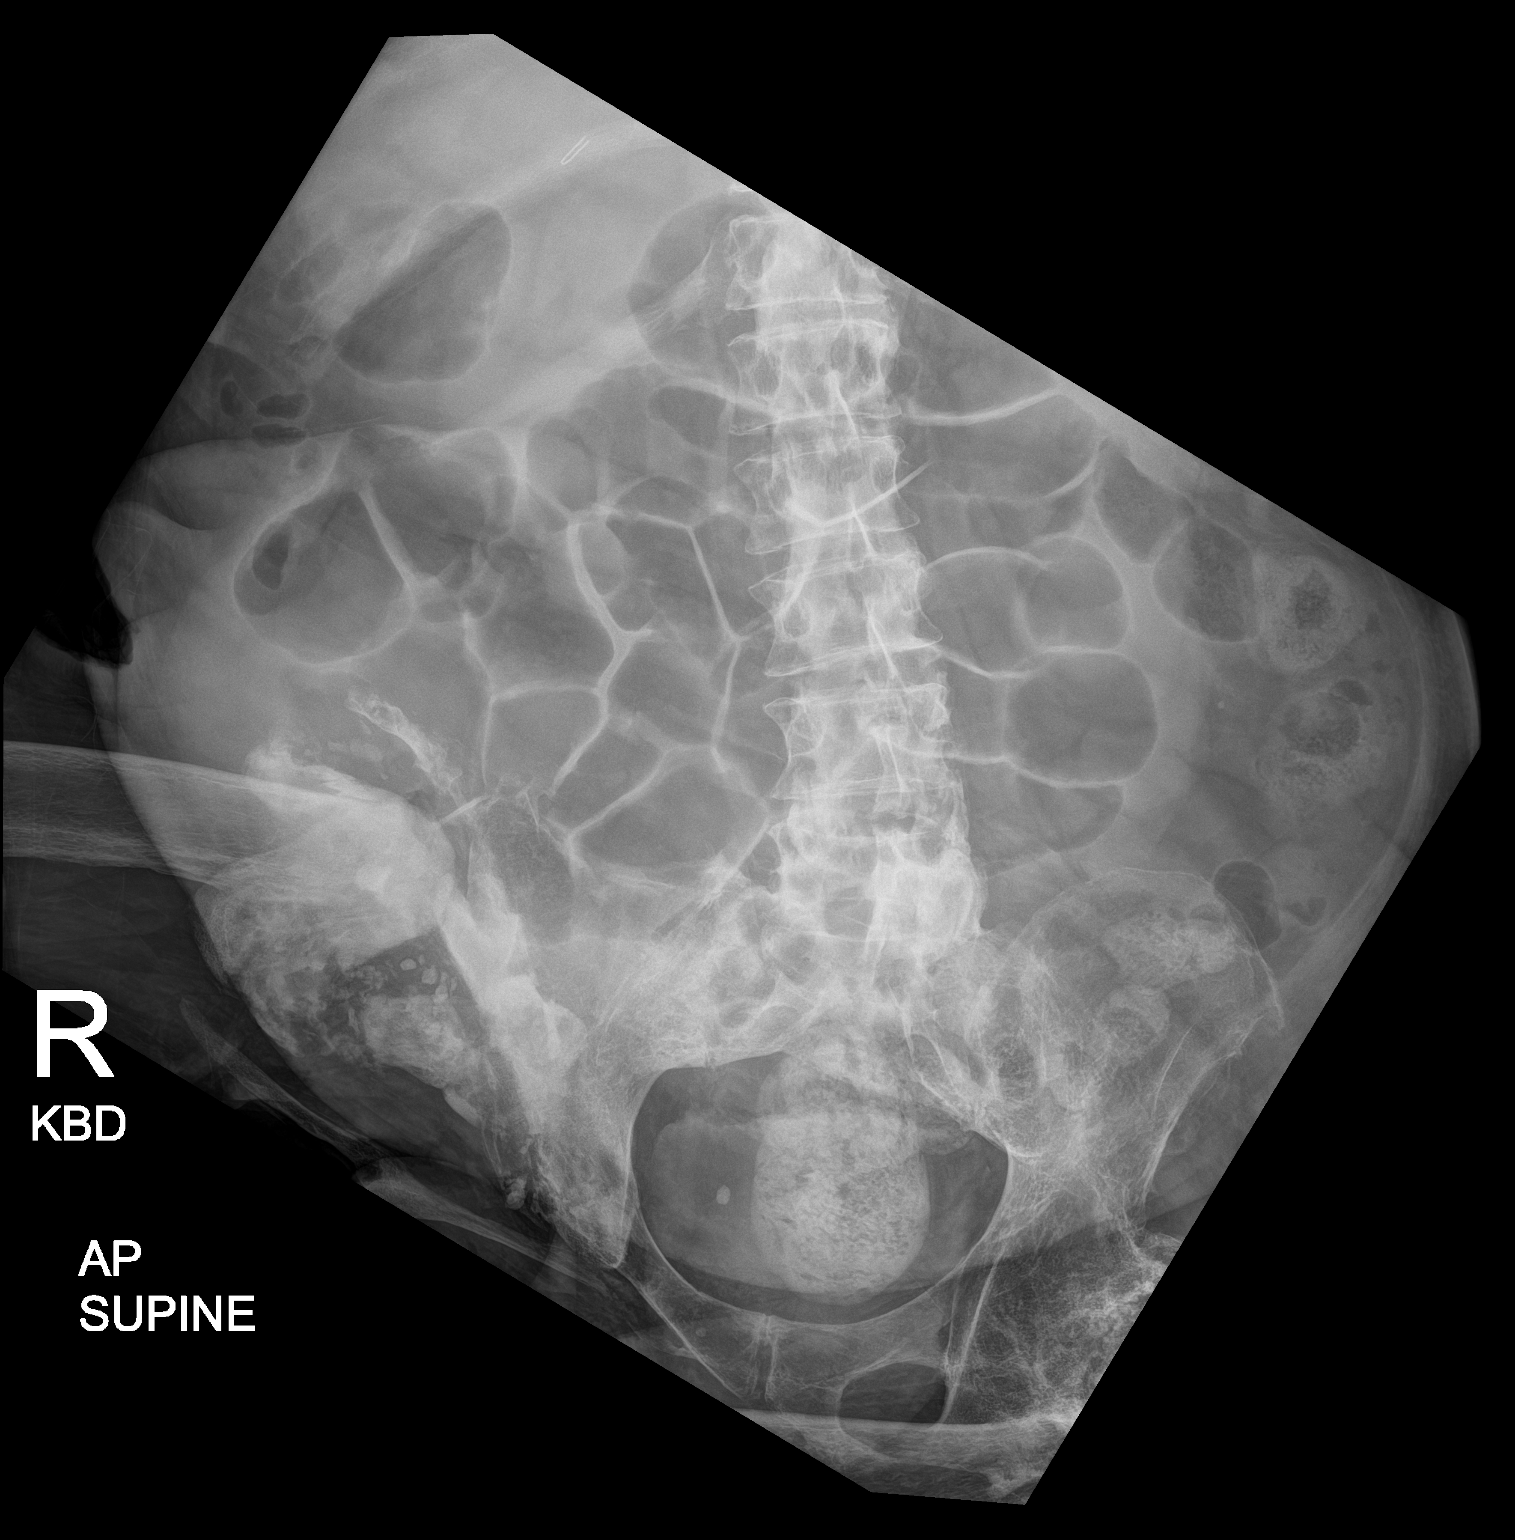

[abdomen kub (2 of 2)]
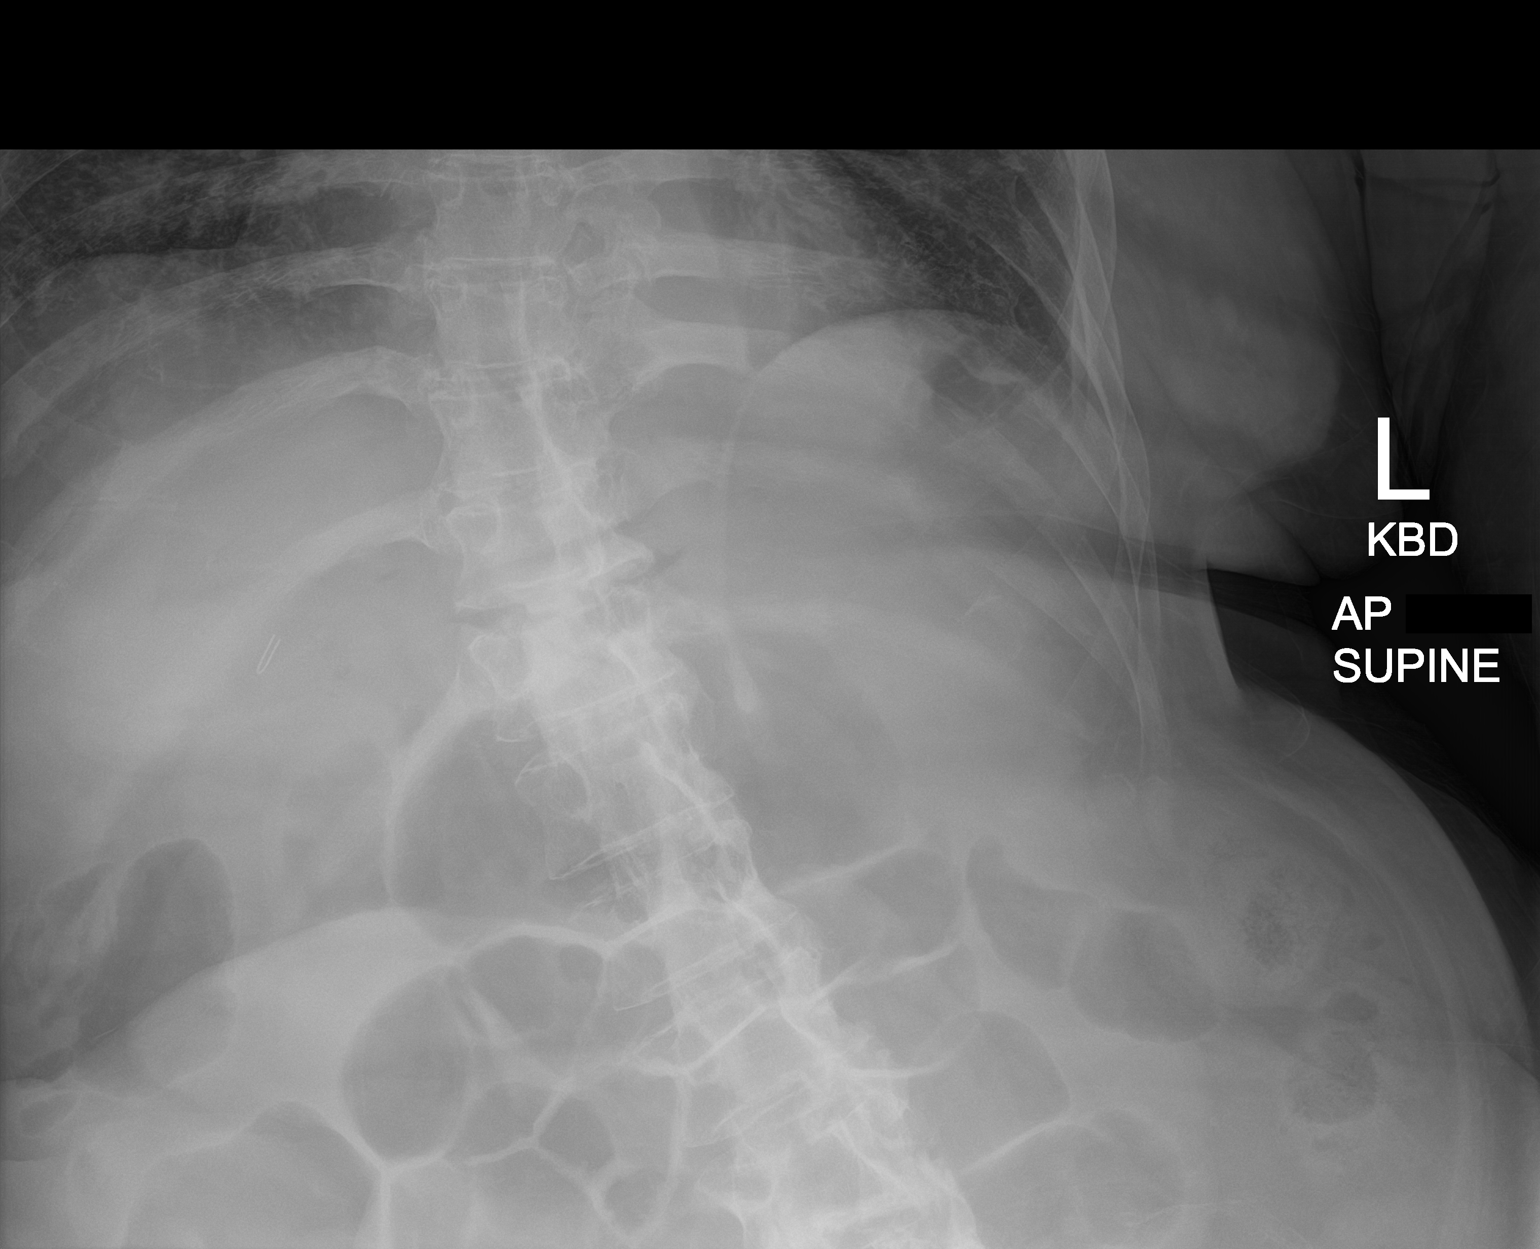

[2 of 2 positions shown; findings below may reference images not displayed]

FINDINGS: Exam demonstrates continued air-filled mildly dilated small bowel
loops with air and contrast throughout the colon. No free peritoneal
air. Remainder the exam is unchanged.
IMPRESSION: No significant change with findings suggesting persistent partial
small bowel obstruction.

## 2021-10-17 IMAGING — CT CT ABD-PELV W/ CM
2 of 5 series · 14 of 46 positions shown, 16 images · IV contrast (omnipaque)
Comparison: CT 01/22/2019

CLINICAL DATA: Abdominal distension

EXAM:
CT ABDOMEN AND PELVIS WITH CONTRAST
TECHNIQUE: Multidetector CT imaging of the abdomen and pelvis was performed
using the standard protocol following bolus administration of
intravenous contrast.
CONTRAST:  100mL OMNIPAQUE IOHEXOL 300 MG/ML  SOLN

[Series 3: abdomen 5.0 · axial · 0.97mm/px · z∈[-636,-211]mm · 11 of 99 slices shown, 13 images]
[im 7/99  soft-tissue]
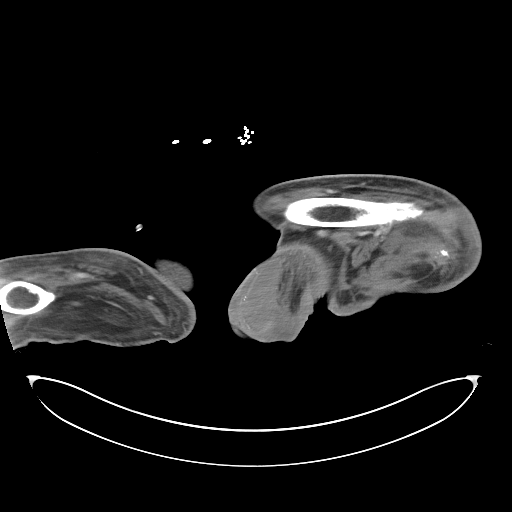
[im 7/99  bone]
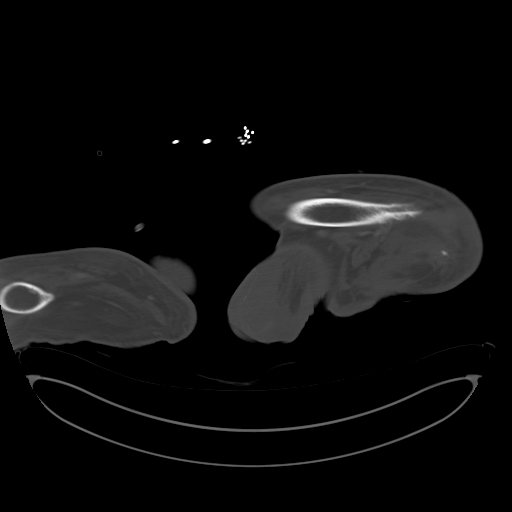
[im 19/99  soft-tissue]
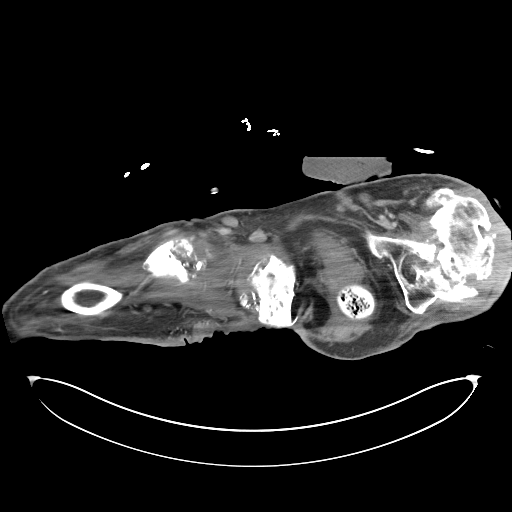
[im 31/99  soft-tissue]
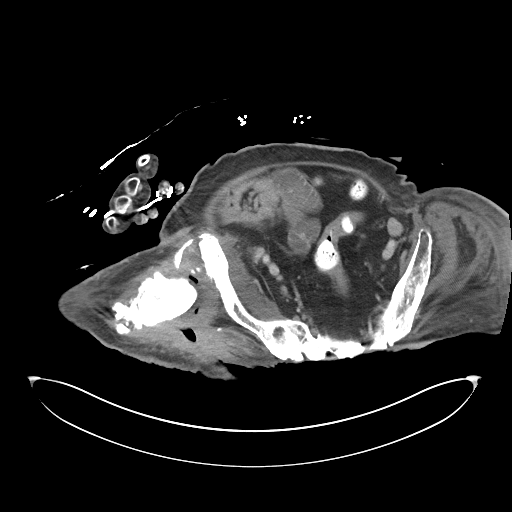
[im 37/99  soft-tissue]
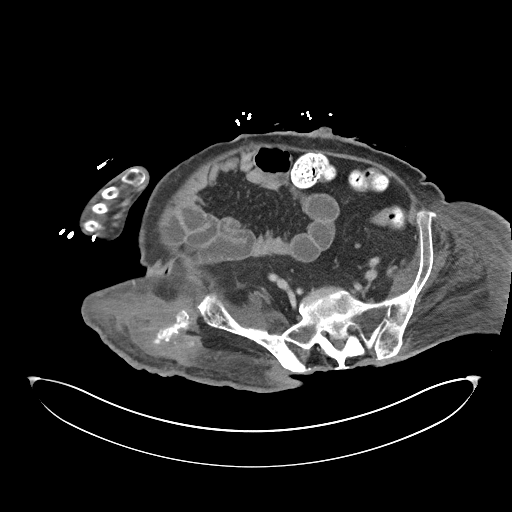
[im 43/99  soft-tissue]
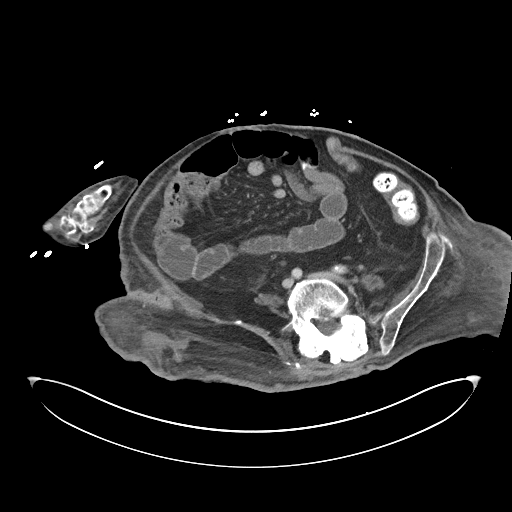
[im 56/99  soft-tissue]
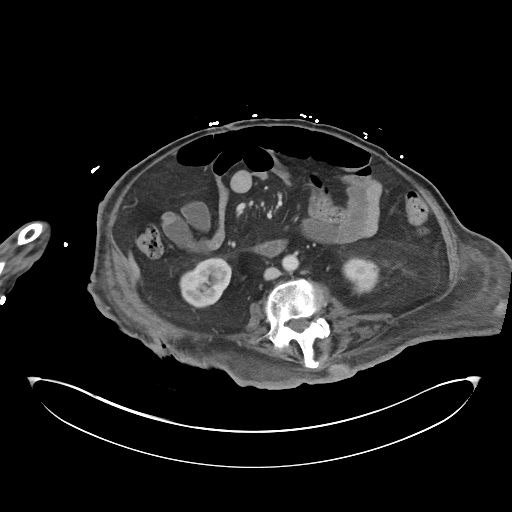
[im 62/99  soft-tissue]
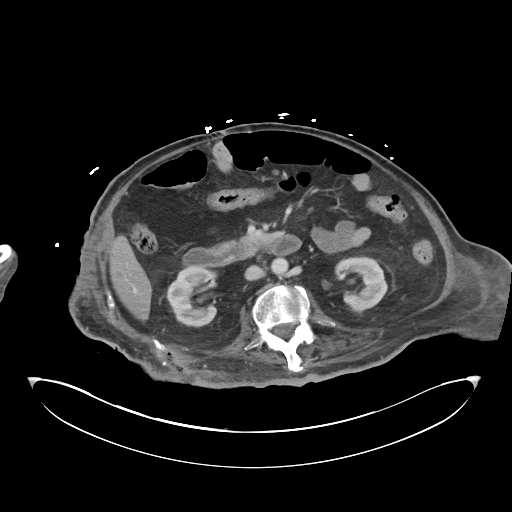
[im 68/99  soft-tissue]
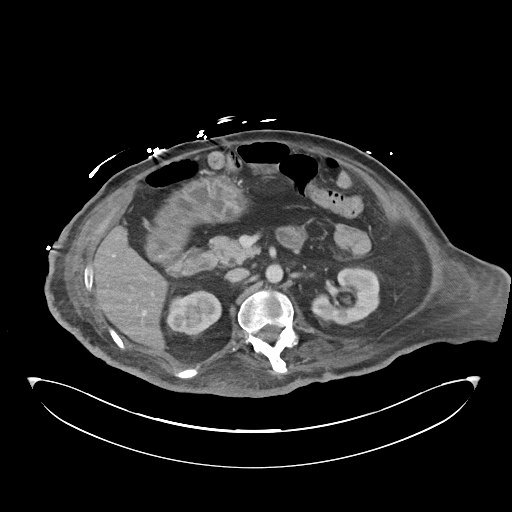
[im 74/99  soft-tissue]
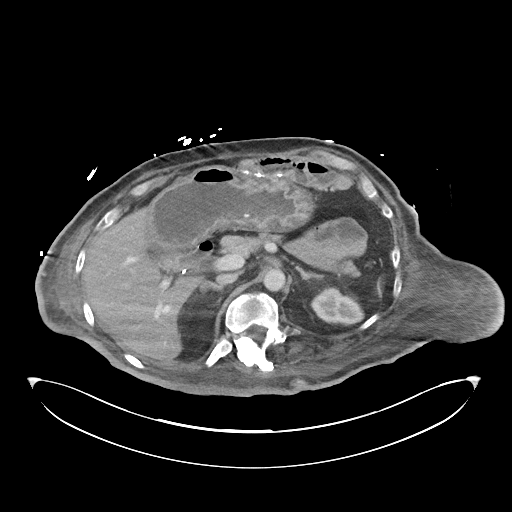
[im 74/99  bone]
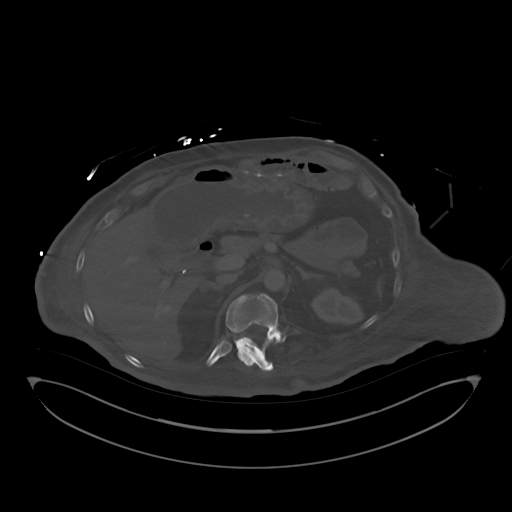
[im 86/99  soft-tissue]
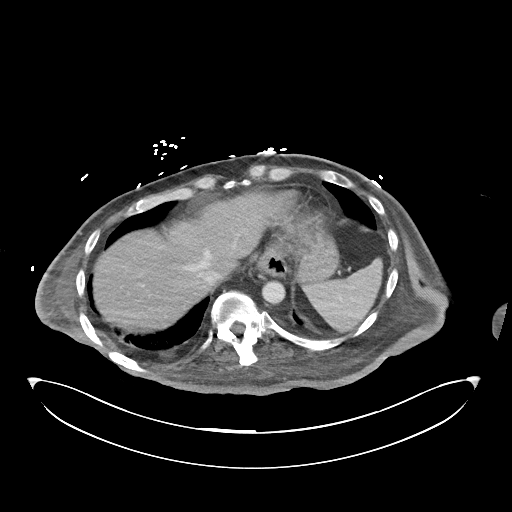
[im 92/99  soft-tissue]
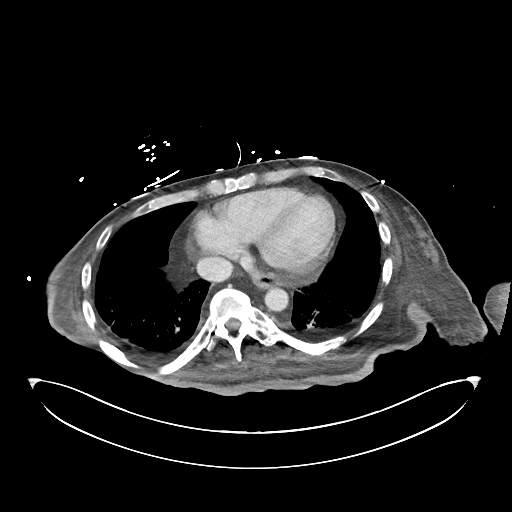

[Series 6: abdomen 3.0 mpr cor · coronal · 0.99mm/px · 3 of 104 slices shown]
[im 35/104  soft-tissue]
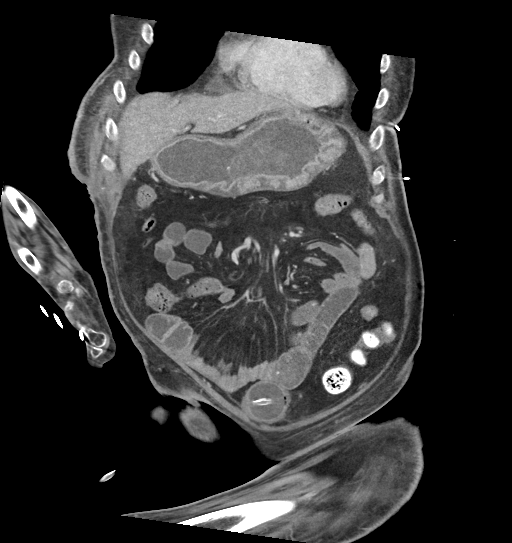
[im 46/104  soft-tissue]
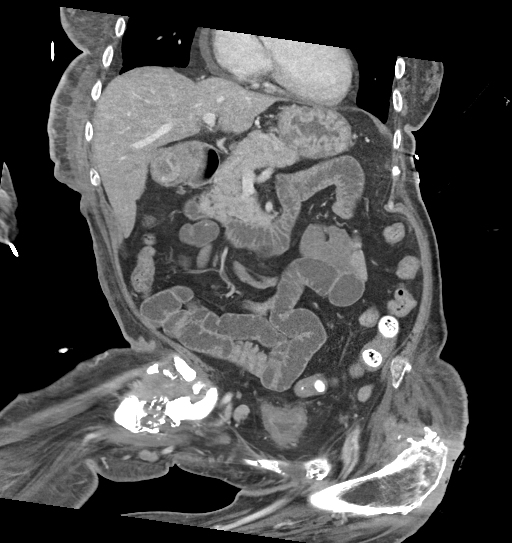
[im 58/104  soft-tissue]
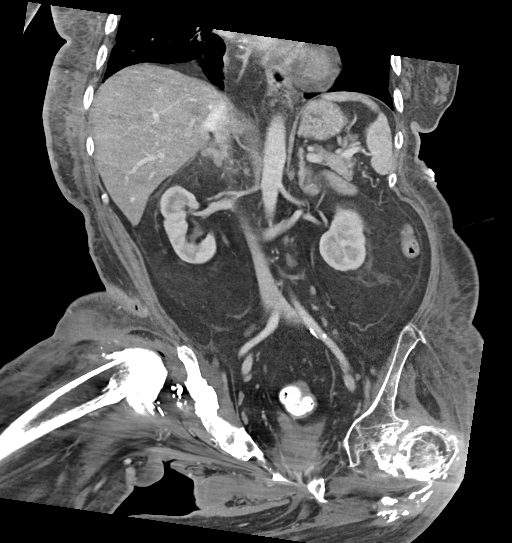

[14 of 46 positions shown; findings below may reference images not displayed]

FINDINGS: Lower chest: There is abundant subpleural fat bilaterally. Small
collections within the subpleural fat of the right lung base appear
trace to regions of posterior skin thickening ([DATE]). Adjacent areas
of atelectasis are noted.

Hepatobiliary: No focal liver abnormality is seen. No gallstones,
gallbladder wall thickening, or biliary dilatation.

Pancreas: Unremarkable. No pancreatic ductal dilatation or
surrounding inflammatory changes.

Spleen: Normal in size without focal abnormality.

Adrenals/Urinary Tract: Normal adrenal glands. Stable appearance of
the bilateral perinephric stranding and retroperitoneal haze
centered upon both kidneys, nonspecific. Kidneys are otherwise
unremarkable, without renal calculi, suspicious lesion, or
hydronephrosis. Bladder is unremarkable. Circumferential bladder
wall thickening decompressed about an inflated suprapubic catheter.

Stomach/Bowel: Small hiatal hernia. Postsurgical changes from prior
gastric bypass. Mild gastric wall thickening and mucosal hyperemia,
nonspecific. No CT evidence of anastomotic stricture ring. Multiple
fluid-filled loops of small bowel without abnormal wall thickening
or enteric inflammation. Post partial colectomy with left lower
quadrant end colostomy. No colonic dilatation or wall thickening.

Vascular/Lymphatic: Atherosclerotic plaque within the normal caliber
aorta. Reactive appearing lymph nodes in the groin and mesentery.

Reproductive: The prostate and seminal vesicles are unremarkable.

Other: There is circumferential body wall edema. Focal areas skin
thickening is seen in the right flank with subcutaneous extension to
the pleural space extending through the chest wall there is a
questionable rim enhancing collection in the subpleural fat
measuring 8 mm x 20 mm in size. Extensive soft tissue thickening
along the groin.

Musculoskeletal: Additional ulceration is noted along the right
anterolateral flank extends to the left tenth and eleventh
anterolateral costochondral junctions with associated sclerotic
change. Additional posterior right flank ulceration with soft tissue
gas extending to the tip of the right twelfth rib with destructive
osteomyelitic changes which are new from comparison exam. There is
extensive bilateral sacral decubitus ulcers with sclerotic changes
of the iliac wings and sacrum. Increasing thickening and
osteomyelitic changes of the right hip and ulceration along the
lateral left hip with destructive changes as well. Multilevel
degenerative changes are present in the imaged portions of the
spine. Long segmental fusion of right articular processes the
thoracolumbar spine.
IMPRESSION: 1. Postsurgical changes from prior gastric bypass without CT
evidence of anastomotic stricture.
2. Mild gastric wall thickening and mucosal hyperemia, nonspecific,
but can be seen with gastritis.
3. Extensive bilateral sacral decubitus ulcers sclerotic osseous
features compatible with osteomyelitis.
4. Cutaneous ulceration and phlegmon extends to the right twelfth
rib with destructive osseous changes compatible osteomyelitis.
5. Additional ulceration and phlegmonous change of the right flank
extends into a collection of subpleural fat in the right lung base
with a 8 x 20 mm rim enhancing collection suspicious for abscess and
a smaller more phlegmonous change seen laterally in the same pack
collection.
6. Soft tissue ulceration, gas and fluid extending to both hips with
features of severe chronic osteomyelitis.
7. Circumferential bladder wall thickening decompressed about an
inflated suprapubic catheter. Correlate with urinalysis to exclude
cystitis.
8. Aortic Atherosclerosis ([id number]).

These results were called by telephone at the time of interpretation
on 03/05/2019 at [DATE] to provider QUIRIJN AMAZIGH , who verbally
acknowledged these results.

## 2021-10-17 IMAGING — DX DG CHEST 1V PORT
1 series · 1 of 1 positions shown · non-contrast
Comparison: 01/03/2019

CLINICAL DATA: Cough

EXAM:
PORTABLE CHEST 1 VIEW

[chest ap]
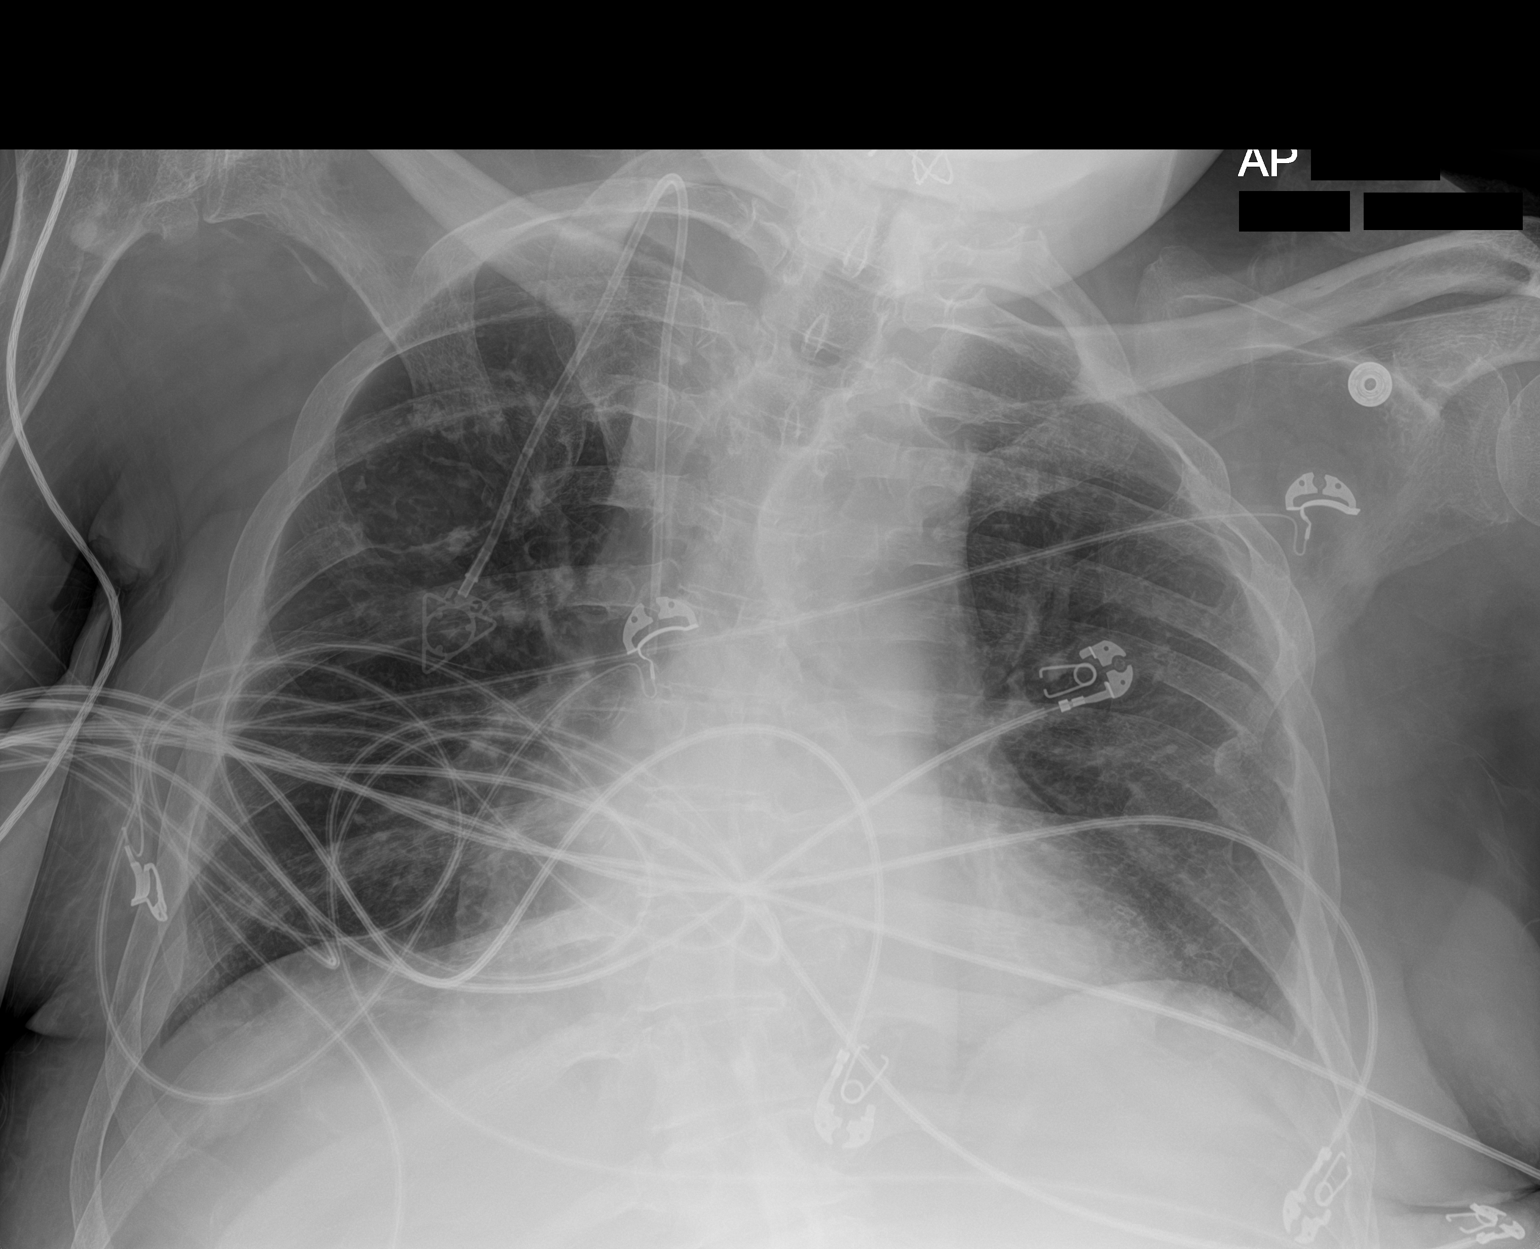

[1 of 1 positions shown; findings below may reference images not displayed]

FINDINGS: Right IJ Port-A-Cath remains in place, tip in caval to atrial
junction. Cardiomediastinal contours are stable accounting for
portable technique and depth of inspiration.

Lungs are clear.

Previous changes of prior trauma or thoracotomy associated with left
ribs are again noted.
IMPRESSION: 1. No interval change in the appearance of the chest. No acute
abnormalities.
2. Stable right IJ Port-A-Cath.

## 2021-10-19 IMAGING — XA IR CATHETER TUBE CHANGE
2 series · 2 of 2 positions shown · non-contrast
Comparison: none

INDICATION: URINARY TRACT INFECTION, CHRONIC PUBIC CATHETER

[Series 1: fl (-) angio · 1 of 1 slices shown (1 of 2)]
[im 1/1]
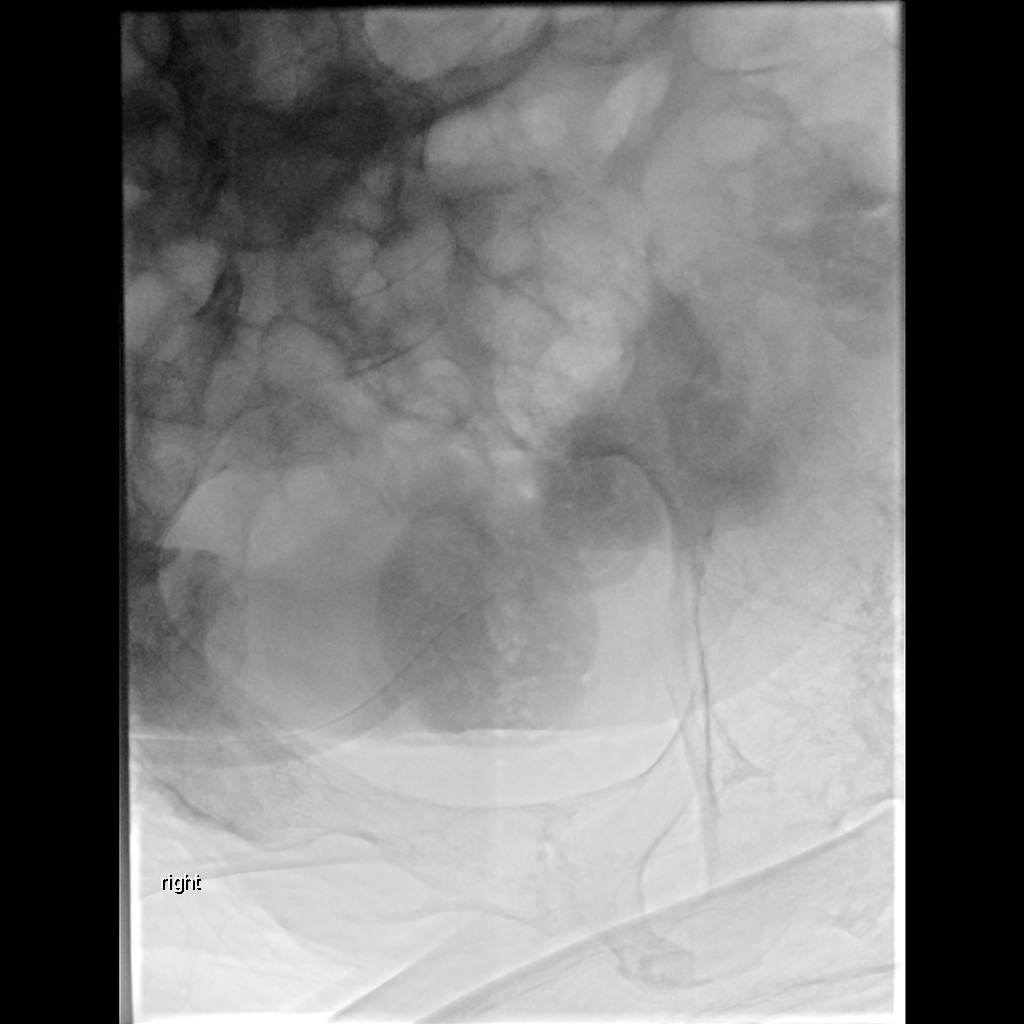

[Series 2: fl (-) angio · 1 of 1 slices shown (2 of 2)]
[im 1/1]
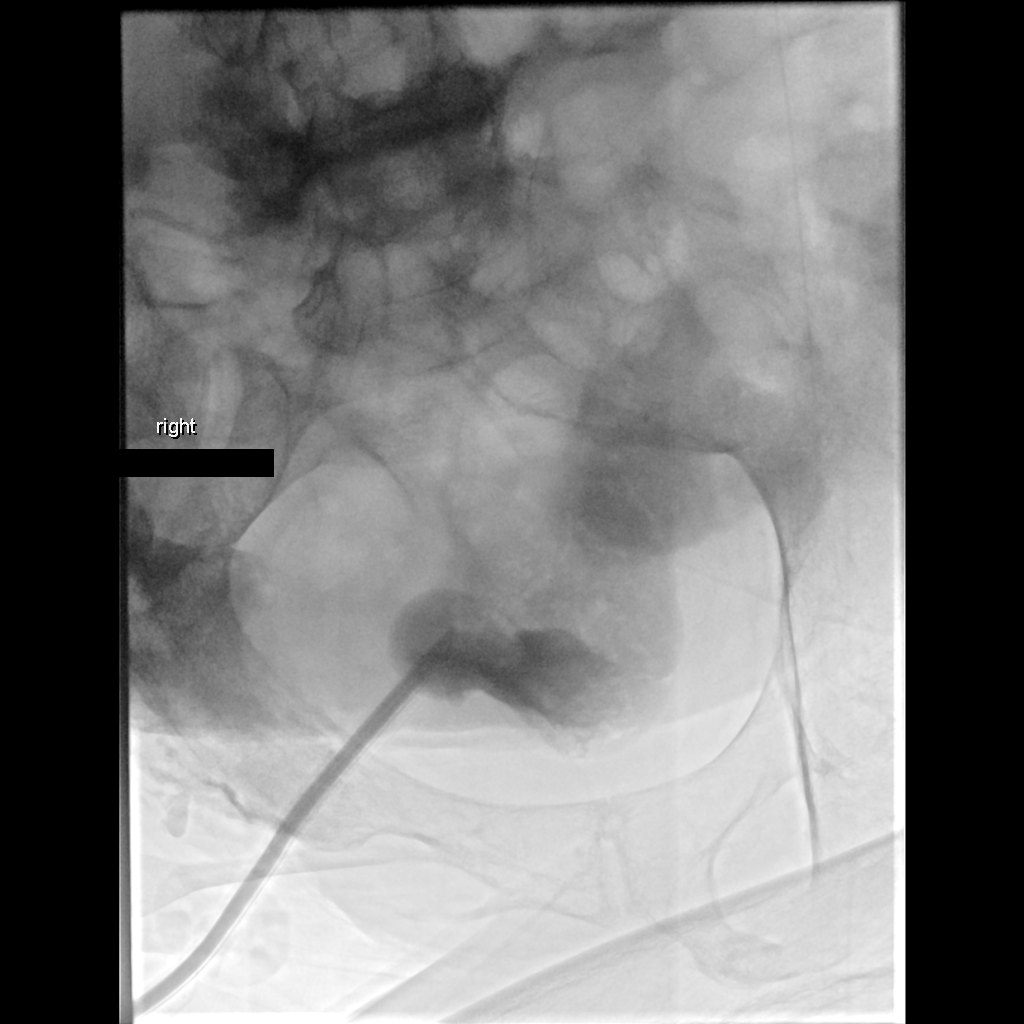

[2 of 2 positions shown; findings below may reference images not displayed]

EXAM:
FLUOROSCOPIC EXCHANGE CHRONIC SUPRAPUBIC CATHETER

MEDICATIONS:
The patient is currently admitted to the hospital and receiving
intravenous antibiotics. The antibiotics were administered within an
appropriate time frame prior to the initiation of the procedure.

ANESTHESIA/SEDATION:
Moderate Sedation Time:  NONE.

The patient was continuously monitored during the procedure by the
interventional radiology nurse under my direct supervision.

COMPLICATIONS:
None immediate.

PROCEDURE:
Informed written consent was obtained from the patient after a
thorough discussion of the procedural risks, benefits and
alternatives. All questions were addressed. Maximal Sterile Barrier
Technique was utilized including caps, mask, sterile gowns, sterile
gloves, sterile drape, hand hygiene and skin antiseptic. A timeout
was performed prior to the initiation of the procedure.

Under sterile conditions, the existing 22 French Foley balloon tip
suprapubic catheter was exchanged for a 22 French balloon tip
catheter. Retention balloon inflated with 10 cc saline containing 1
cc contrast. Position confirmed with fluoroscopic injection. Images
obtained for documentation. Patient tolerated the procedure well.
IMPRESSION: Fluoroscopic exchange of the 22 French balloon retention suprapubic
catheter.

## 2021-10-31 IMAGING — DX DG CHEST 1V PORT
1 series · 1 of 1 positions shown · non-contrast
Comparison: 03/05/2019

CLINICAL DATA: Fever since [REDACTED].

EXAM:
PORTABLE CHEST 1 VIEW

[chest ap]
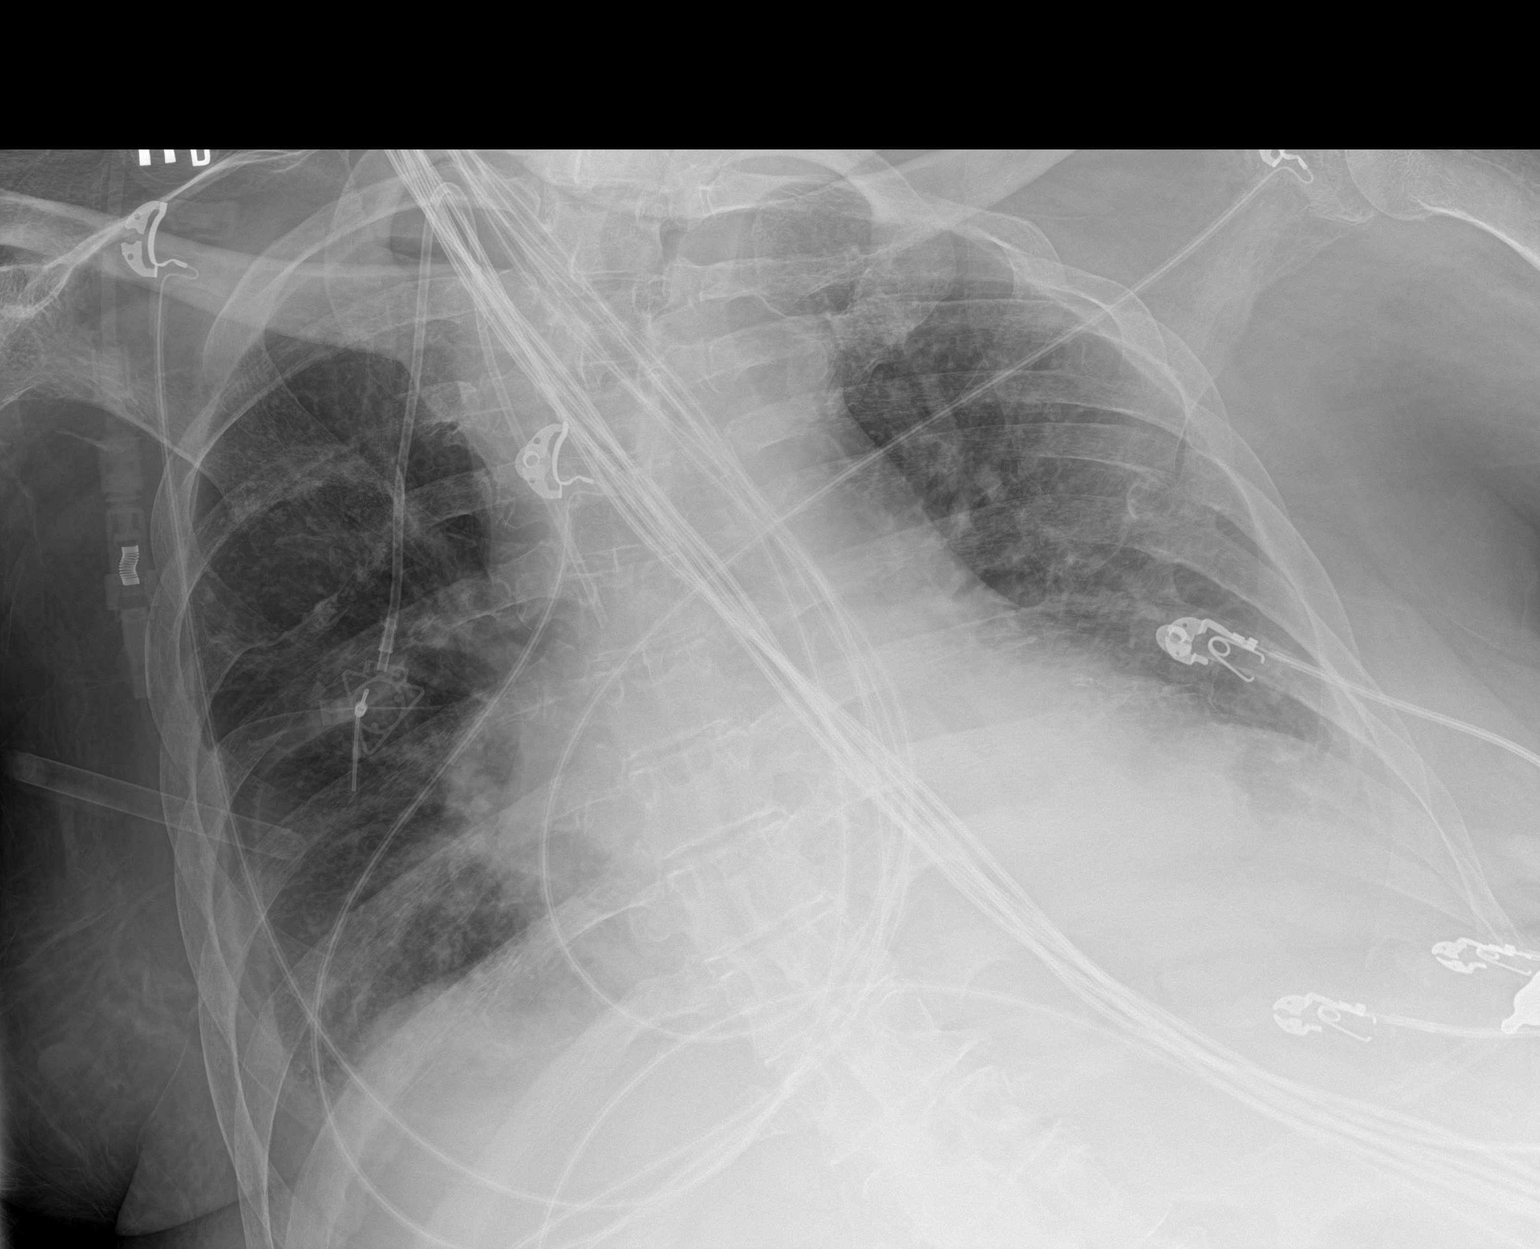

[1 of 1 positions shown; findings below may reference images not displayed]

FINDINGS: Right-sided Port-A-Cath remains in place, unchanged.

Heart and mediastinal contours accentuated by rotation. Increase in
density in retrocardiac region since prior study. Deformity of we
will left-sided ribs unchanged. Expansile destructive process
associated with right posterior fifth rib is similar.
IMPRESSION: 1. Increasing density in the retrocardiac region. Atelectasis or
developing pneumonia is considered. Study limited by rotation.
2. No other interval changes.

## 2021-11-03 IMAGING — DX DG CHEST 1V PORT
1 series · 1 of 1 positions shown · non-contrast
Comparison: March 19, 2019

CLINICAL DATA: Shortness of breath

EXAM:
PORTABLE CHEST 1 VIEW

[chest ap]
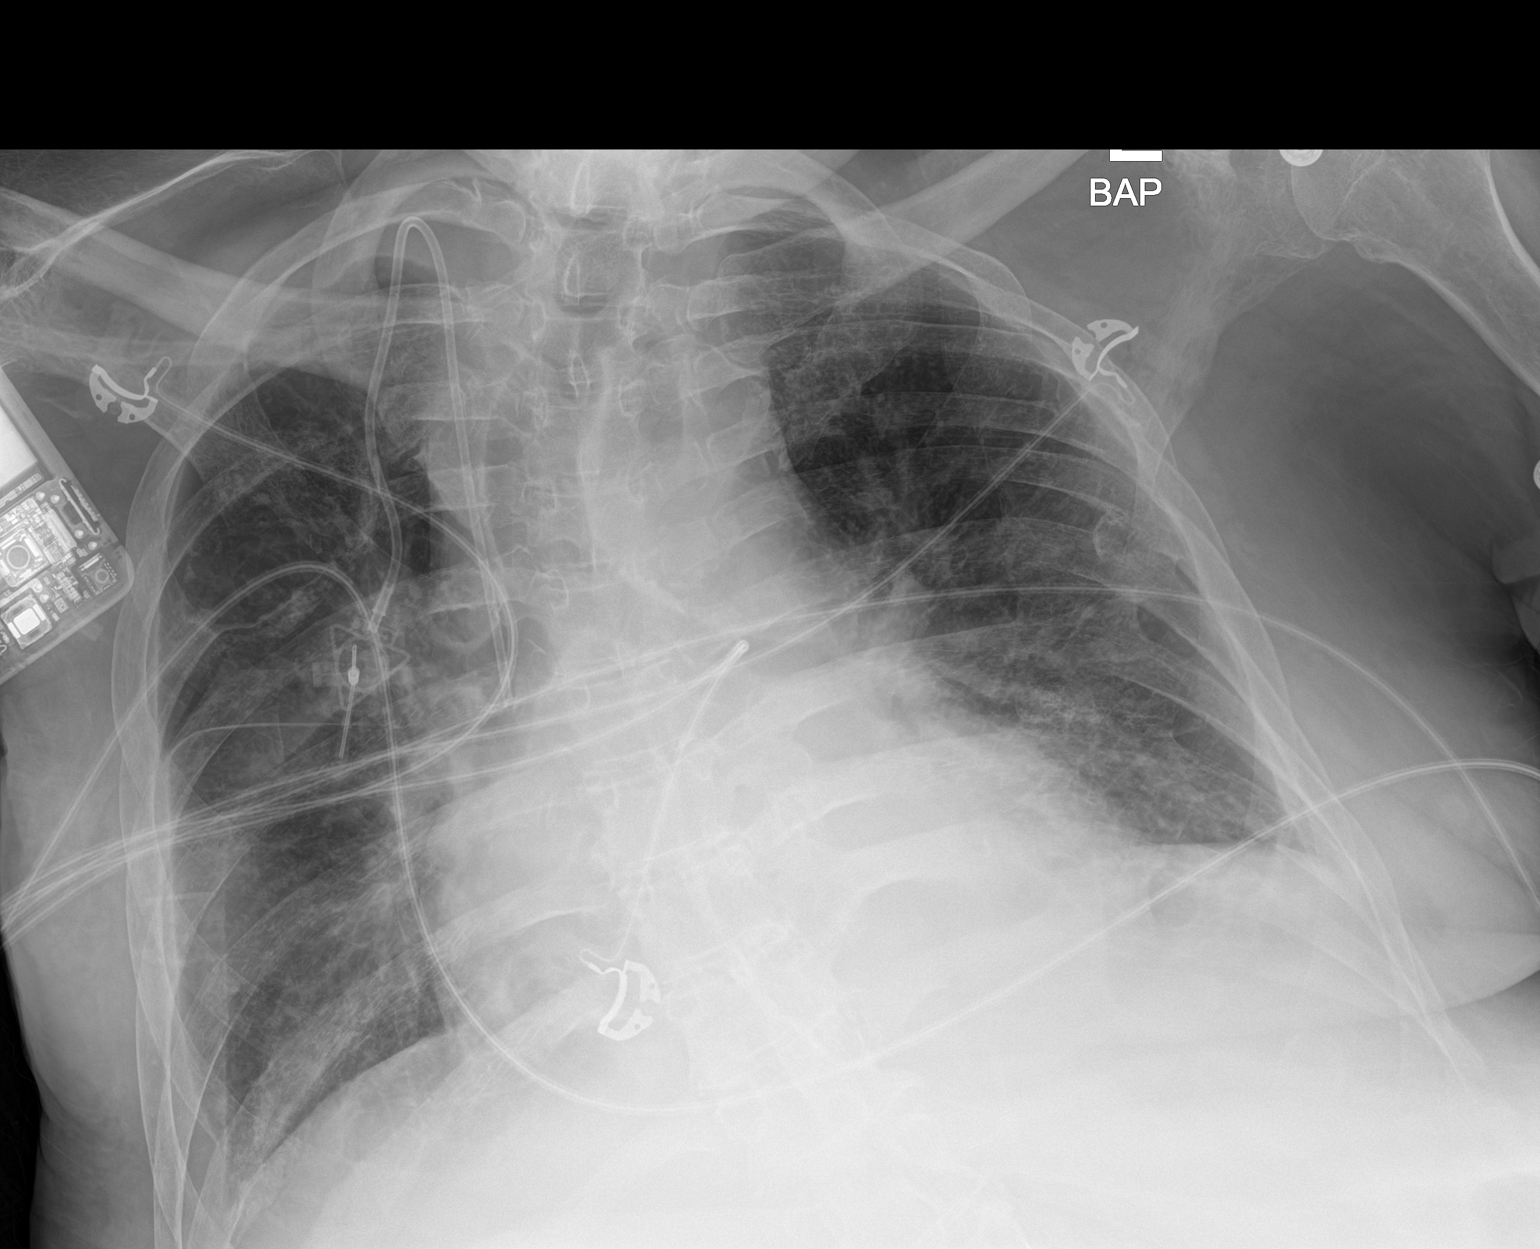

[1 of 1 positions shown; findings below may reference images not displayed]

FINDINGS: The right-sided Port-A-Cath is stable in positioning. The heart size
remains enlarged. There is no pneumothorax. There are small
bilateral pleural effusions. Bibasilar atelectasis versus
consolidation is again noted. There is a stable appearance of the
posterior fifth rib on the right. There are old healed left-sided
rib fractures.
IMPRESSION: 1. Lines and tubes as above.
2. Small bilateral pleural effusions with persistent bibasilar
airspace disease which may represent atelectasis or pneumonia.

## 2021-11-03 IMAGING — DX DG ABDOMEN 1V
2 series · 2 of 2 positions shown · non-contrast
Comparison: Radiograph 01/27/2019

CLINICAL DATA: Abdominal pain and distention, contracted

EXAM:
ABDOMEN - 1 VIEW

[abdomen kub (1 of 2)]
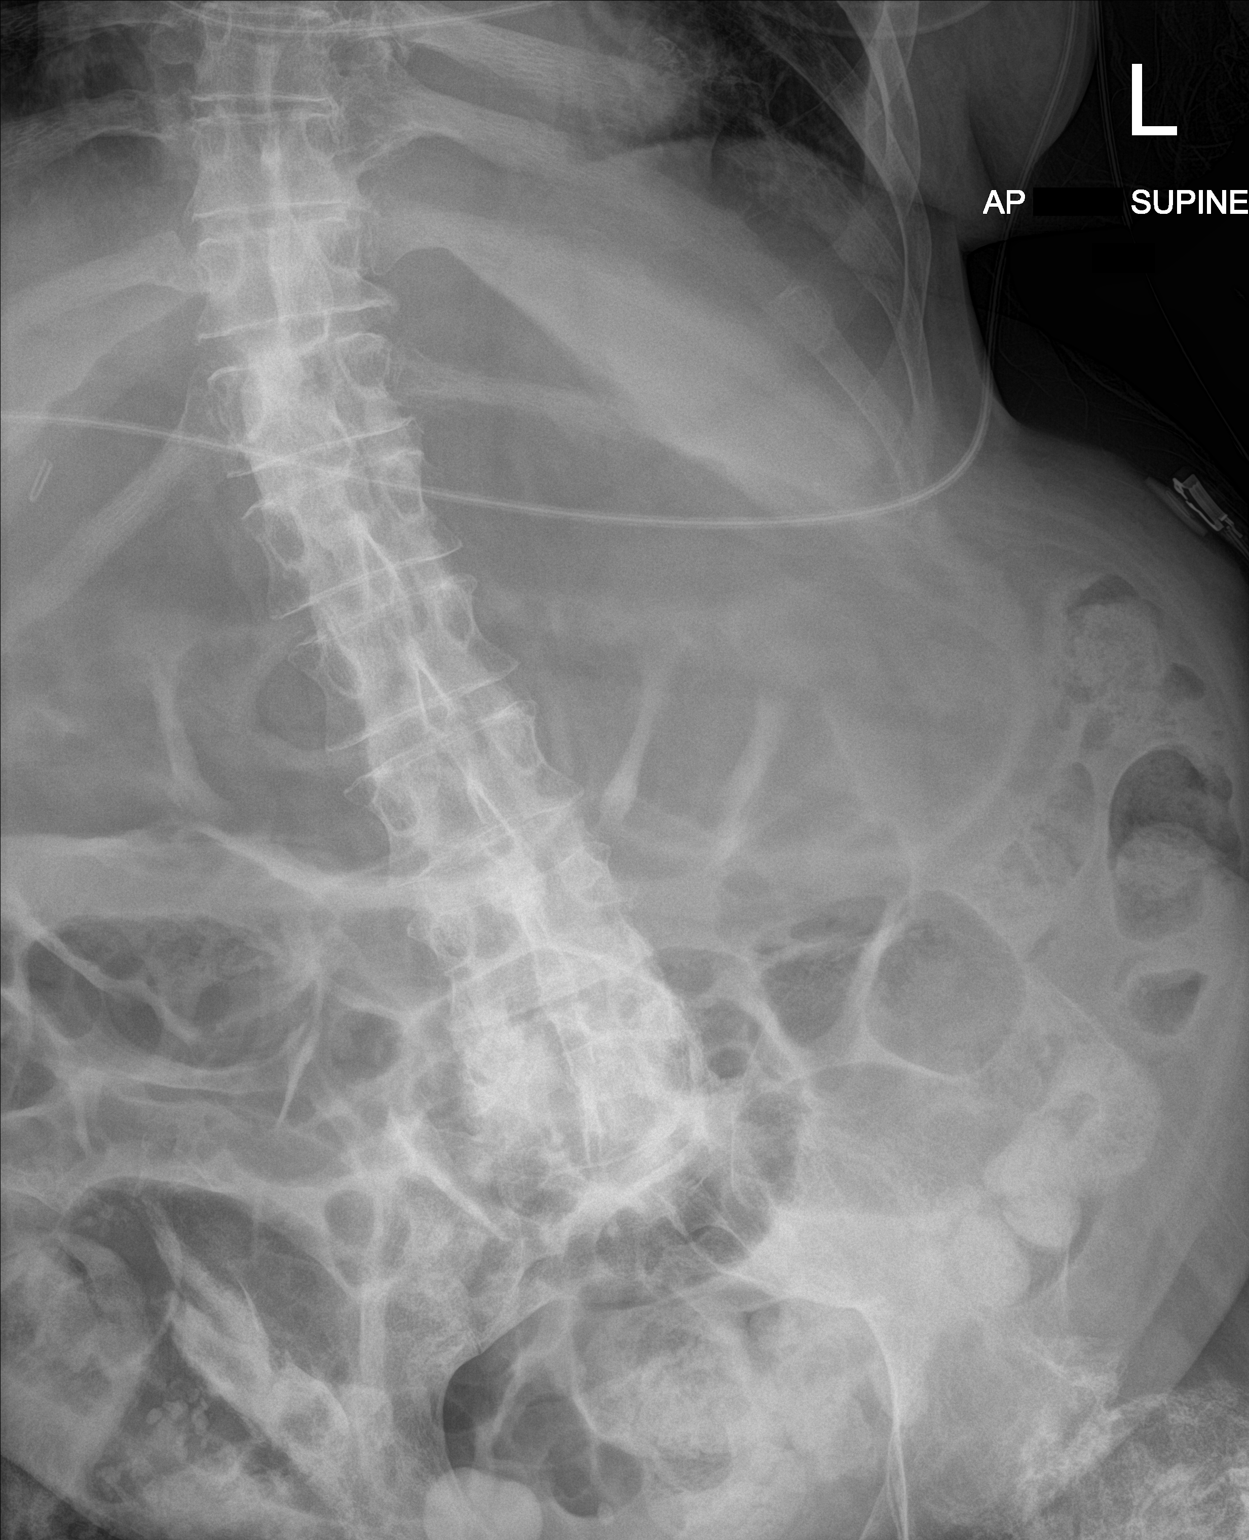

[abdomen kub (2 of 2)]
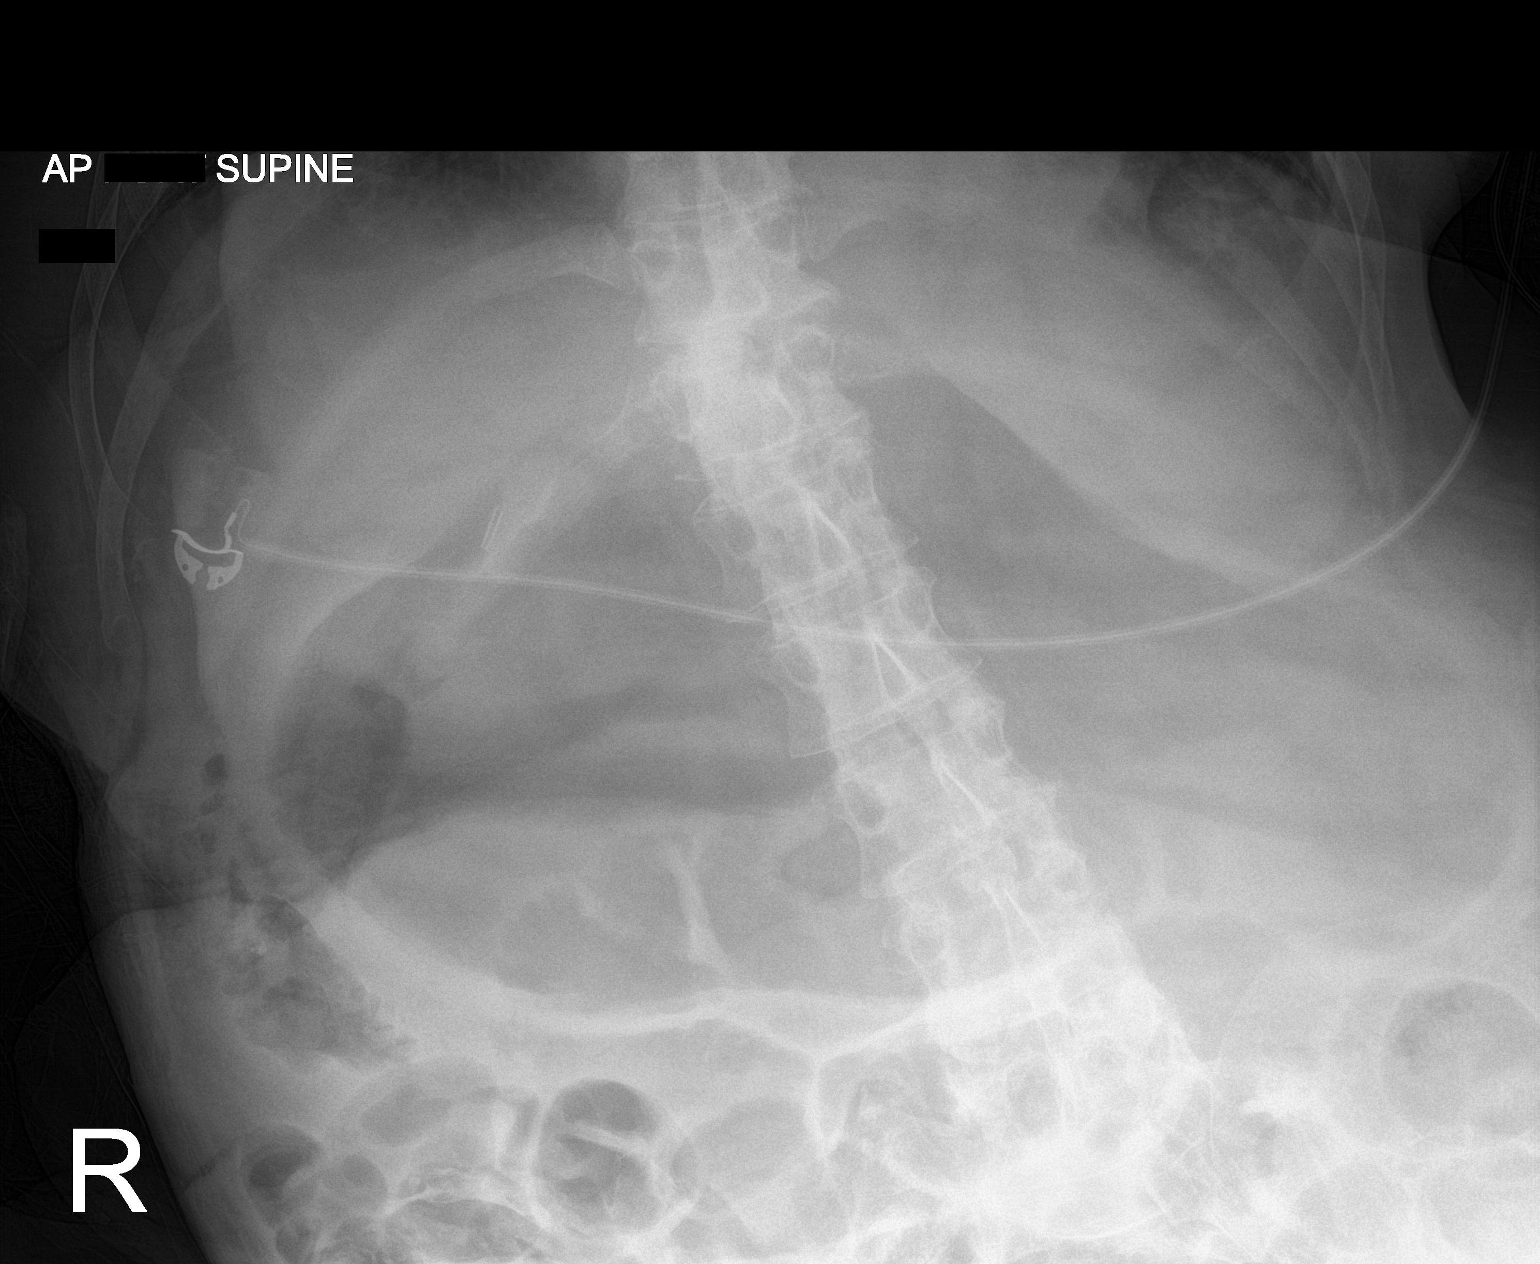

[2 of 2 positions shown; findings below may reference images not displayed]

FINDINGS: Marked gaseous distention of the stomach. Numerous air-filled loops
of both large and small bowel project over the abdomen with air and
stool projecting over the distal colon. Suboptimal imaging quality
due to patient contractions. Multilevel degenerative changes are
present in the imaged portions of the spine. Lung bases are clear.
Surgical clips in the right upper quadrant. No suspicious
calcifications.
IMPRESSION: Marked gaseous distention of the stomach.

Numerous air-filled loops of both large and small bowel project
suggestive of an ileus or early bowel obstruction.

## 2021-11-04 IMAGING — DX DG ABDOMEN 1V
2 series · 2 of 2 positions shown · non-contrast
Comparison: Abdominal radiograph 03/22/2019

CLINICAL DATA: NG tube placement.

EXAM:
ABDOMEN - 1 VIEW

[abdomen kub (1 of 2)]
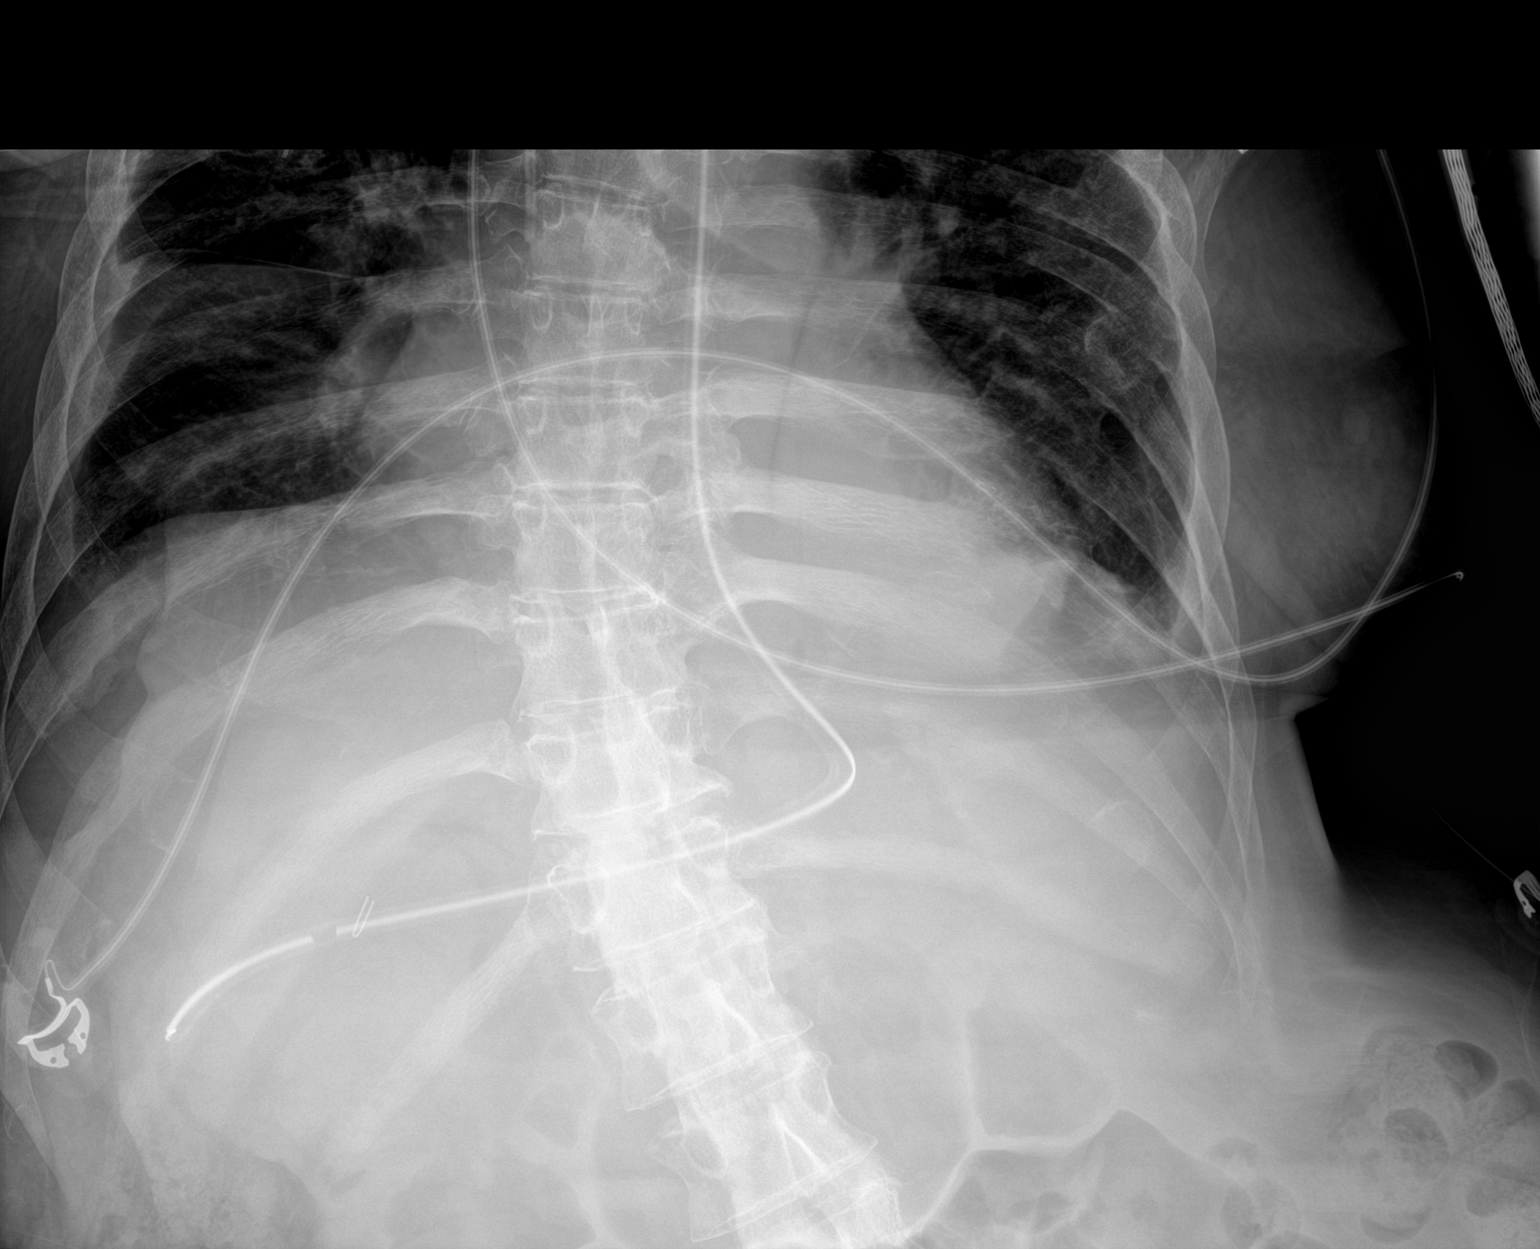

[abdomen kub (2 of 2)]
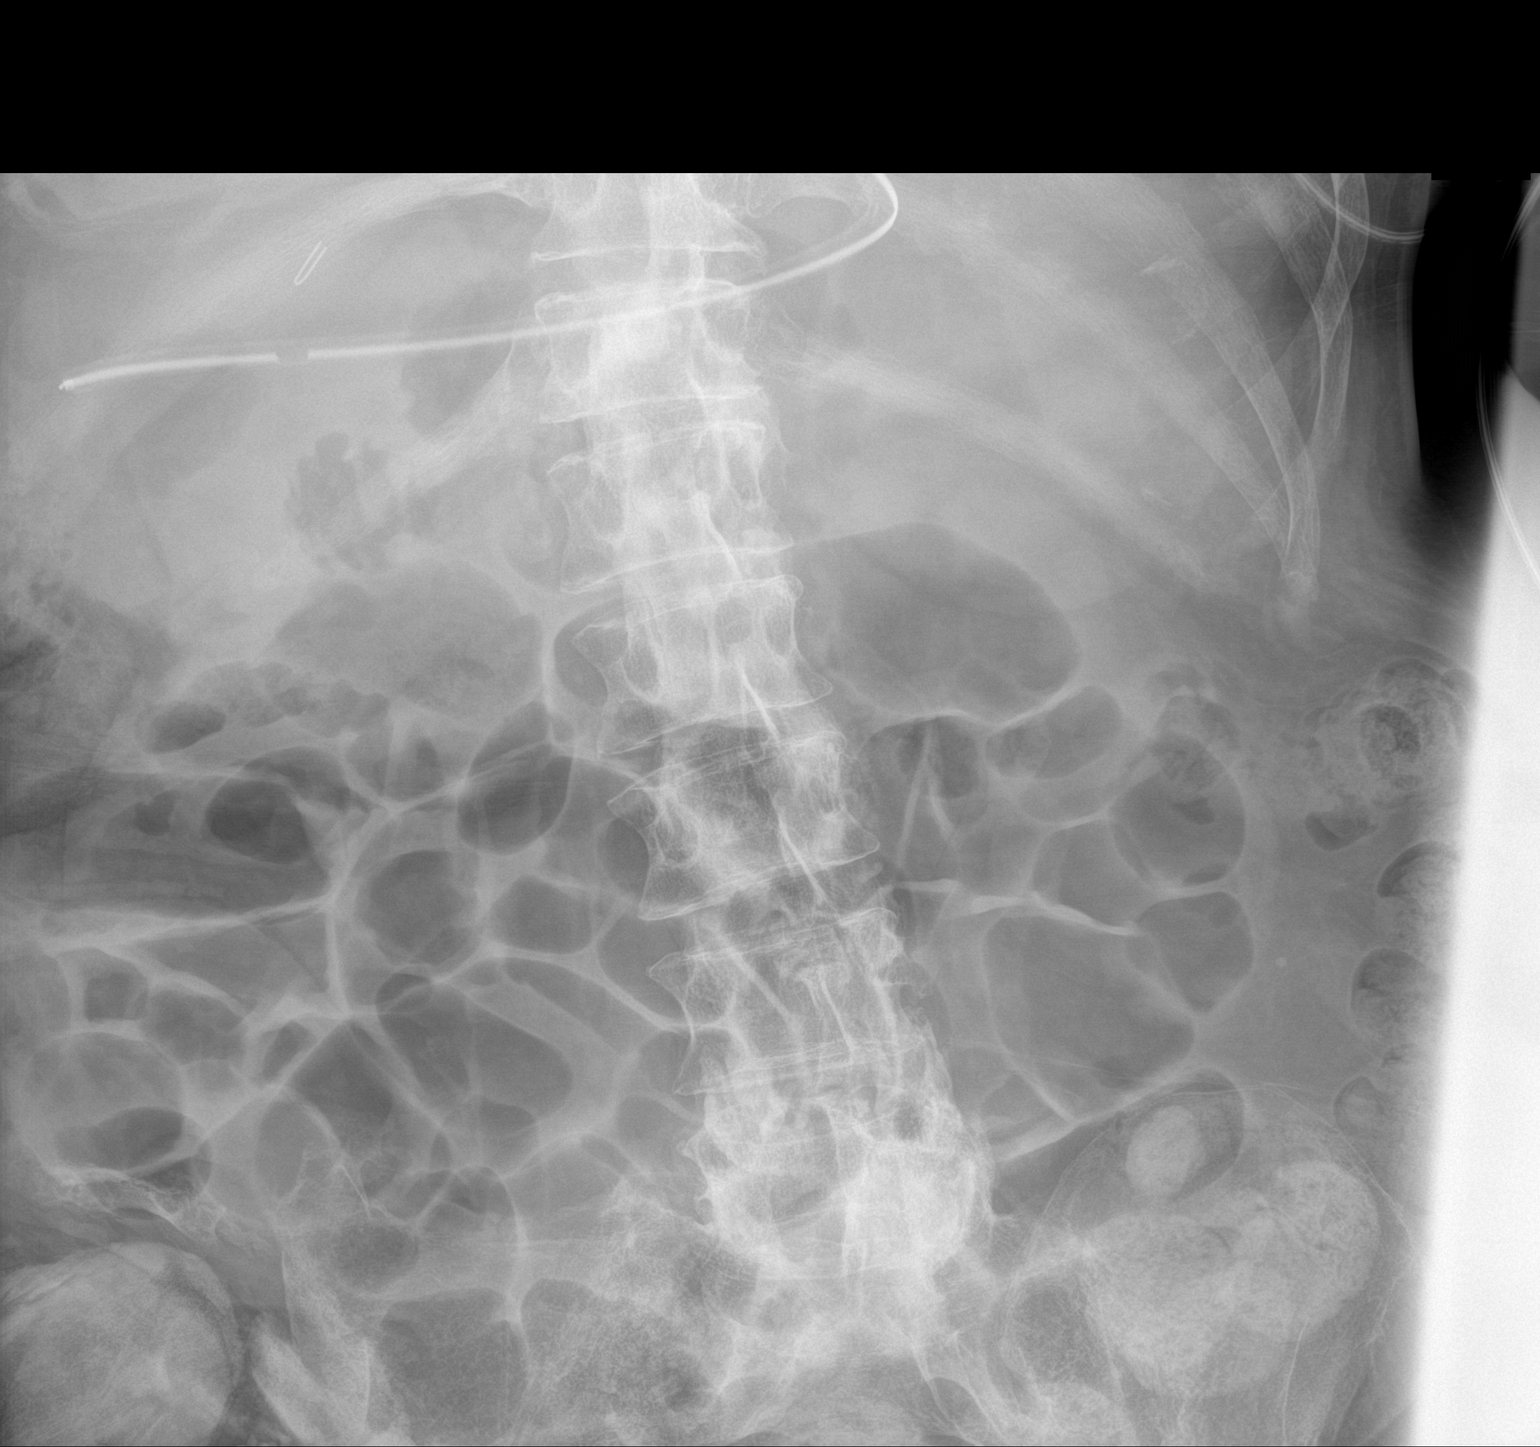

[2 of 2 positions shown; findings below may reference images not displayed]

FINDINGS: Enteric tube tip and side-port project within the right upper
quadrant, likely within the gastric antrum/proximal duodenum.
Heterogeneous opacities lung bases bilaterally. Mildly gaseous
distended small bowel and colon.
IMPRESSION: Enteric tube tip and side-port project of the gastric
antrum/proximal duodenum.

Gaseous distended loops of bowel raising the possibility of ileus.

## 2021-11-08 IMAGING — DX DG CHEST 1V PORT
1 series · 1 of 1 positions shown · non-contrast
Comparison: Chest radiograph dated 03/25/2019

CLINICAL DATA: 53-year-old male with cough and congestion.

EXAM:
PORTABLE CHEST 1 VIEW

[chest]
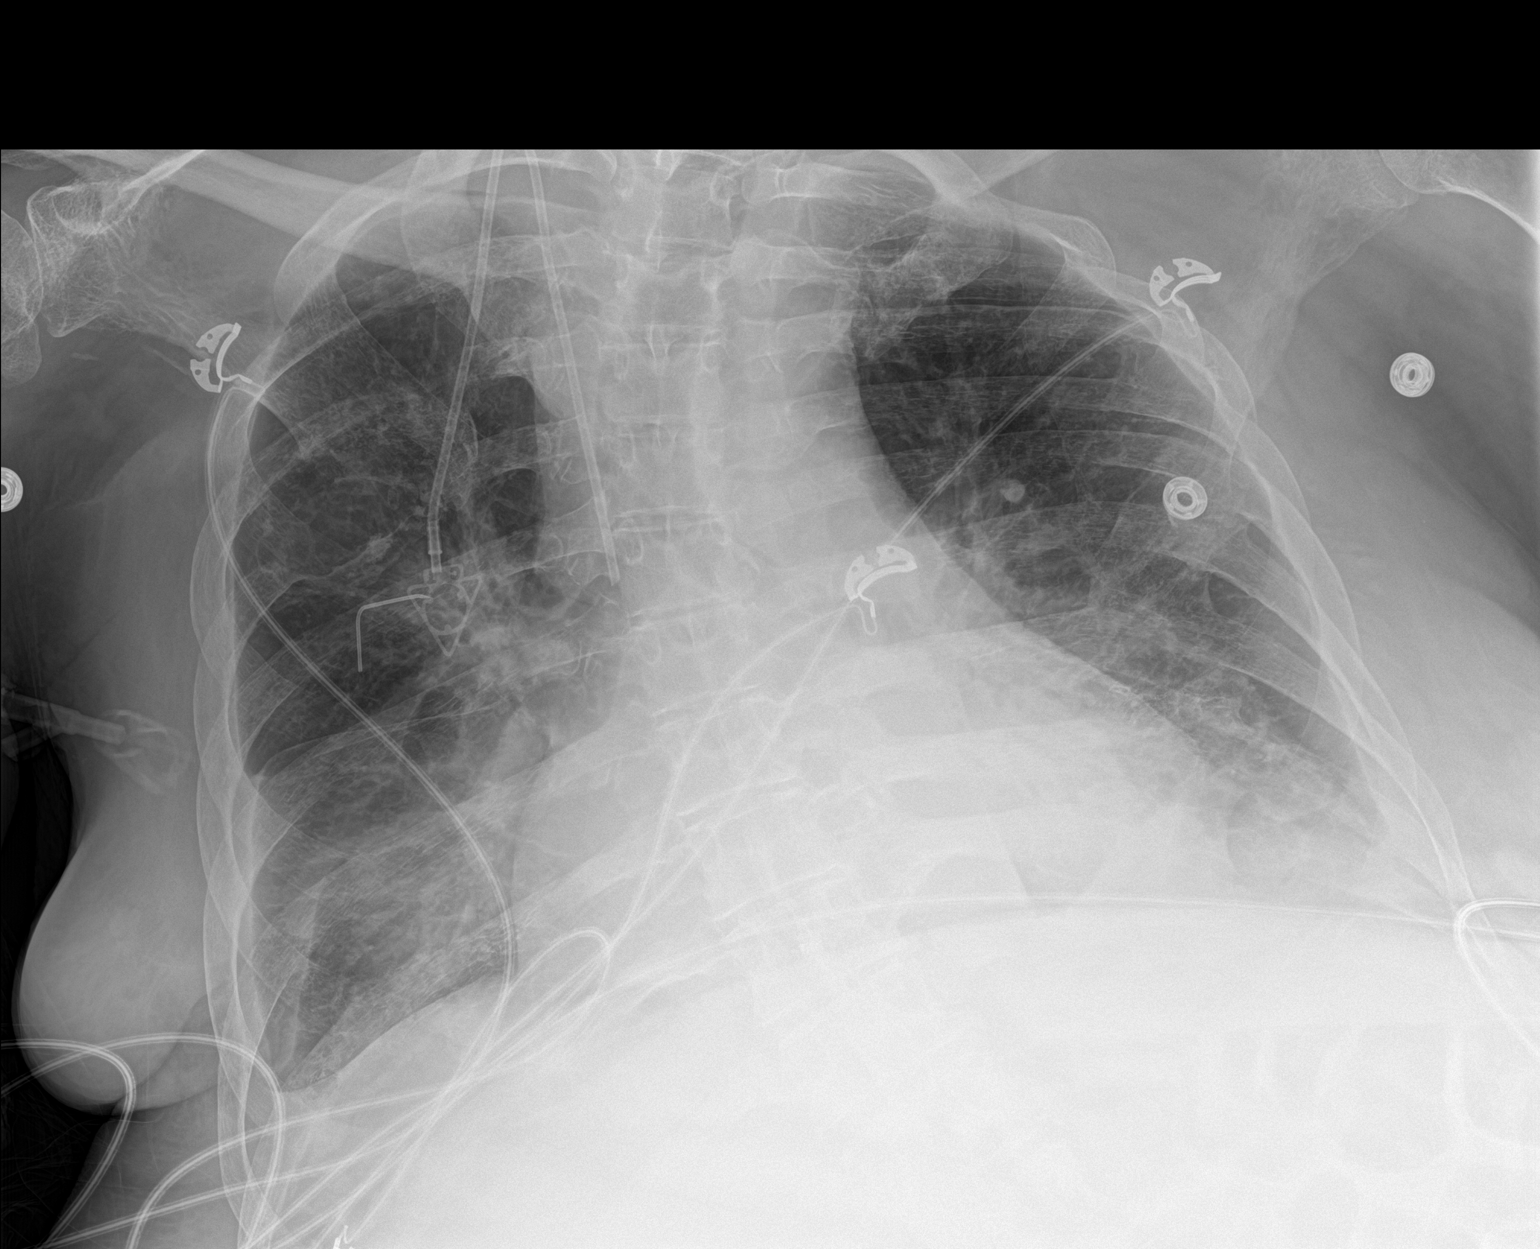

[1 of 1 positions shown; findings below may reference images not displayed]

FINDINGS: There are small bilateral pleural effusions similar to prior
radiograph. Bilateral mid to lower lung field hazy densities may
represent atelectasis or infiltrate. Overall no significant interval
change since the prior radiograph. No pneumothorax. Stable
cardiomegaly. No acute osseous pathology.
IMPRESSION: No significant interval change in the small bilateral pleural
effusions and bilateral mid to lower lung field hazy densities.

## 2021-11-13 IMAGING — DX DG CHEST 1V PORT
1 series · 1 of 1 positions shown · non-contrast
Comparison: 03/27/2019

CLINICAL DATA: Congestion, shortness of breath

EXAM:
PORTABLE CHEST 1 VIEW

[chest ap]
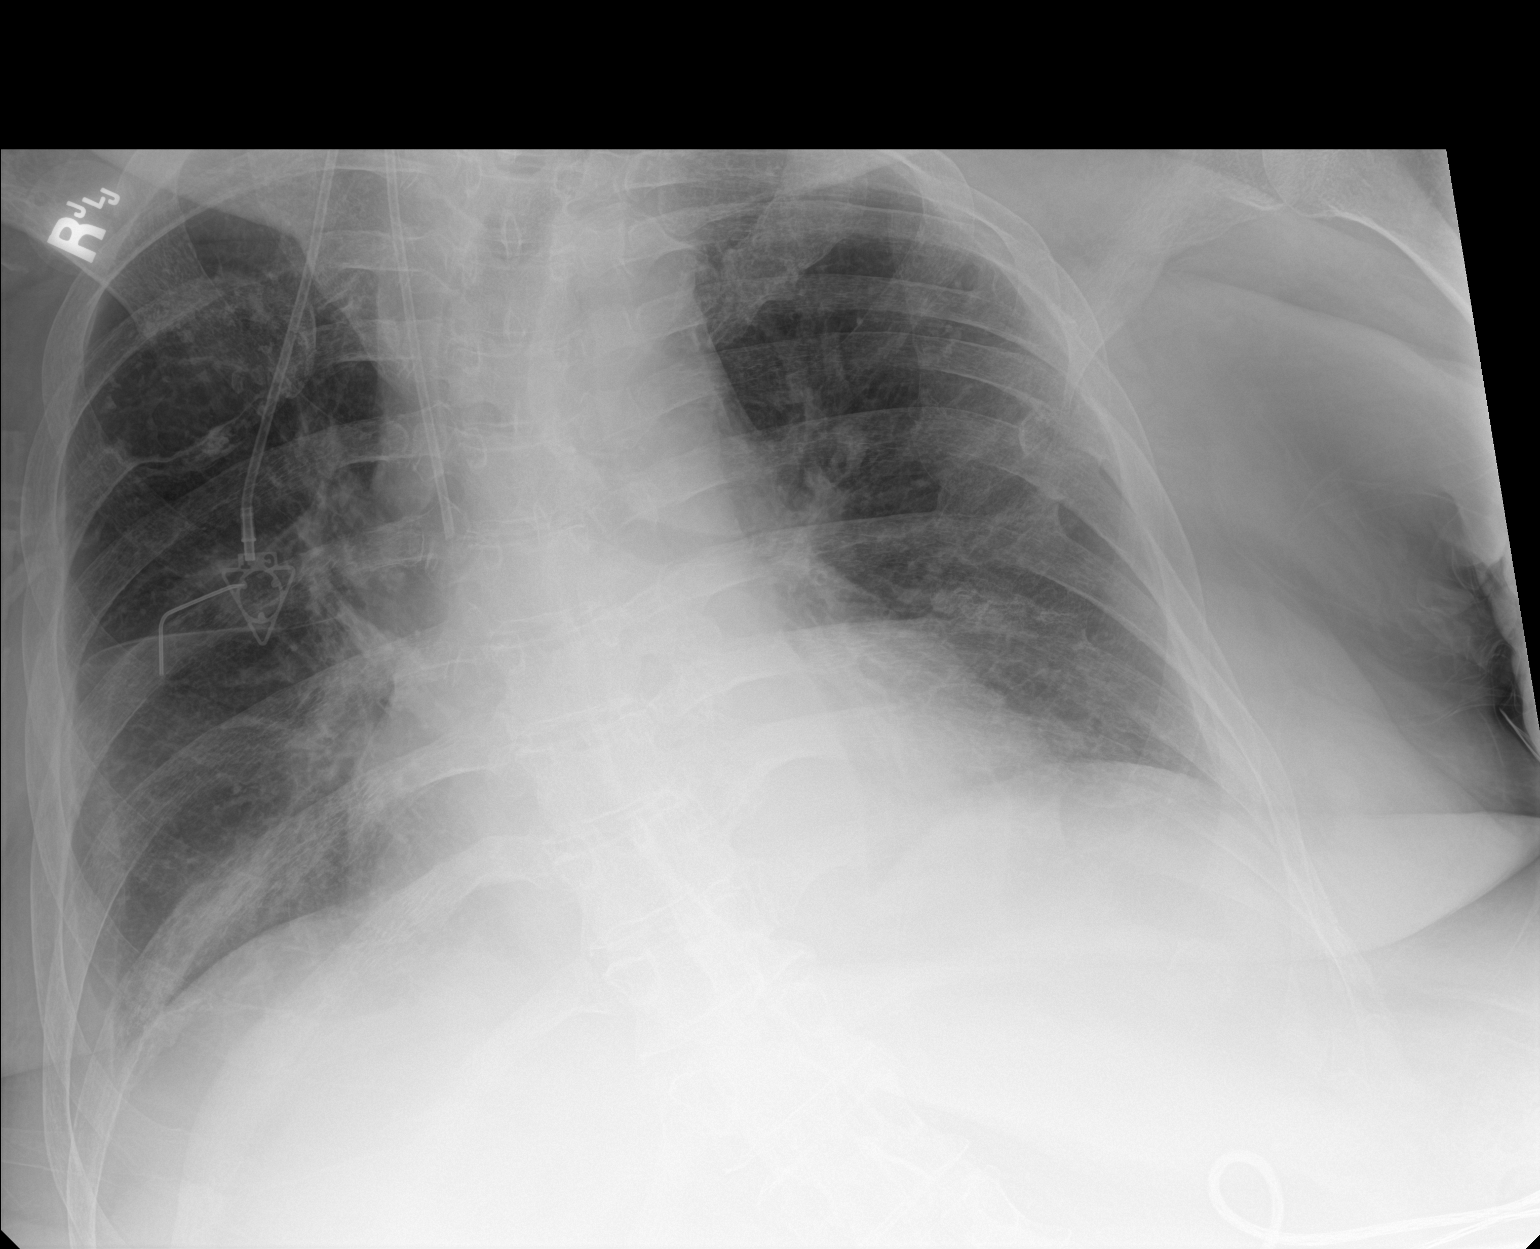

[1 of 1 positions shown; findings below may reference images not displayed]

FINDINGS: Right Port-A-Cath remains in place, unchanged. Mild cardiomegaly.
Small bilateral effusions. Bibasilar atelectasis. No edema. No acute
bony abnormality.
IMPRESSION: Cardiomegaly.

Small bilateral effusions with bibasilar atelectasis.

## 2021-12-03 IMAGING — DX DG CHEST 1V PORT
1 series · 1 of 1 positions shown · non-contrast
Comparison: Prior radiograph from 04/01/2019.

CLINICAL DATA: Initial evaluation for acute vomiting.

EXAM:
PORTABLE CHEST 1 VIEW

[chest ap]
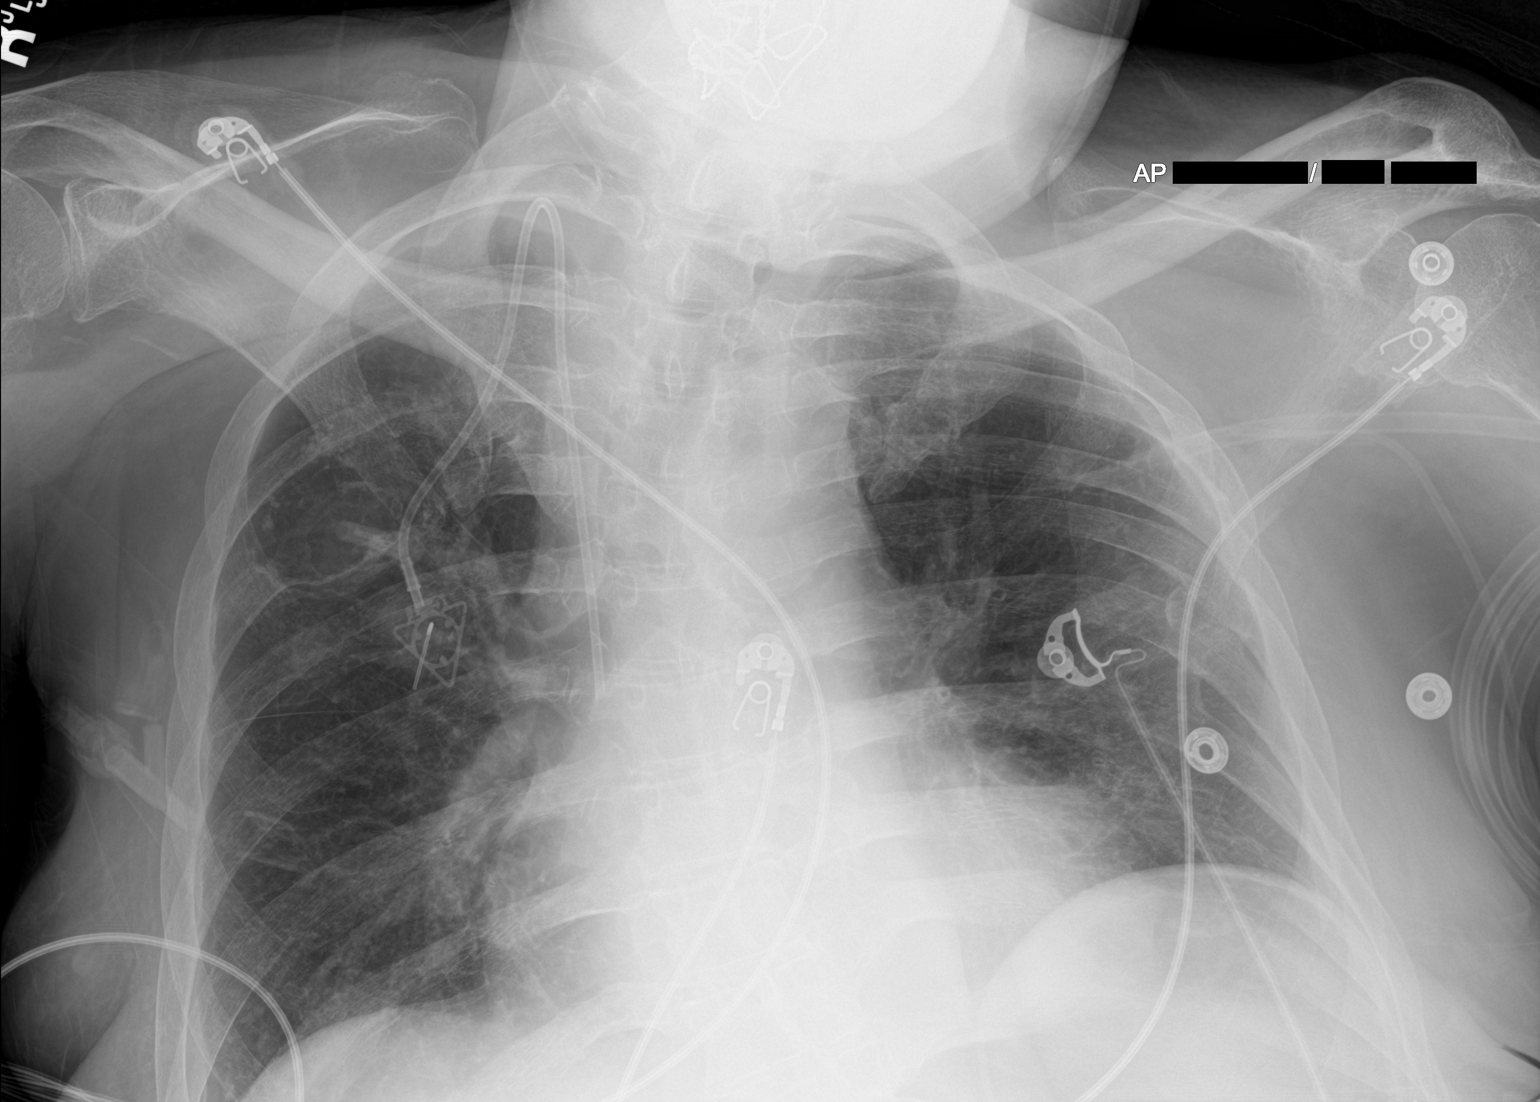

[1 of 1 positions shown; findings below may reference images not displayed]

FINDINGS: Patient is rotated to the left. Cardiomegaly grossly stable.
Mediastinal silhouette within normal limits. Right-sided Port-A-Cath
in place with tip overlying the cavoatrial junction, unchanged.

Lungs are hypoinflated. Irregular pleuroparenchymal scarring at the
right upper lobe noted, stable. No focal infiltrates. No pulmonary
edema or visible pleural effusion. No pneumothorax.

Osseous structures are unchanged.
IMPRESSION: Stable appearance of the chest with no active cardiopulmonary
disease identified.

## 2021-12-08 IMAGING — US IR REMOVAL TUNNELED CV CATH W/PORT/PUMP
1 series · 1 of 1 positions shown · non-contrast
Comparison: none

CLINICAL DATA: Port catheter that I placed 07/13/2016, has worked
well without complication. Recent suspected bloodstream infection,
prophylactic central line removal requested.

EXAM:
EXAM
TUNNELED PORT CATHETER REMOVAL
TECHNIQUE: The procedure, risks (including but not limited to bleeding,
infection, organ damage ), benefits, and alternatives were explained
to the patient. Questions regarding the procedure were encouraged
and answered. The patient understands and consents to the procedure.

[Series 1: (id) · 1 of 1 slices shown]
[im 1/1]
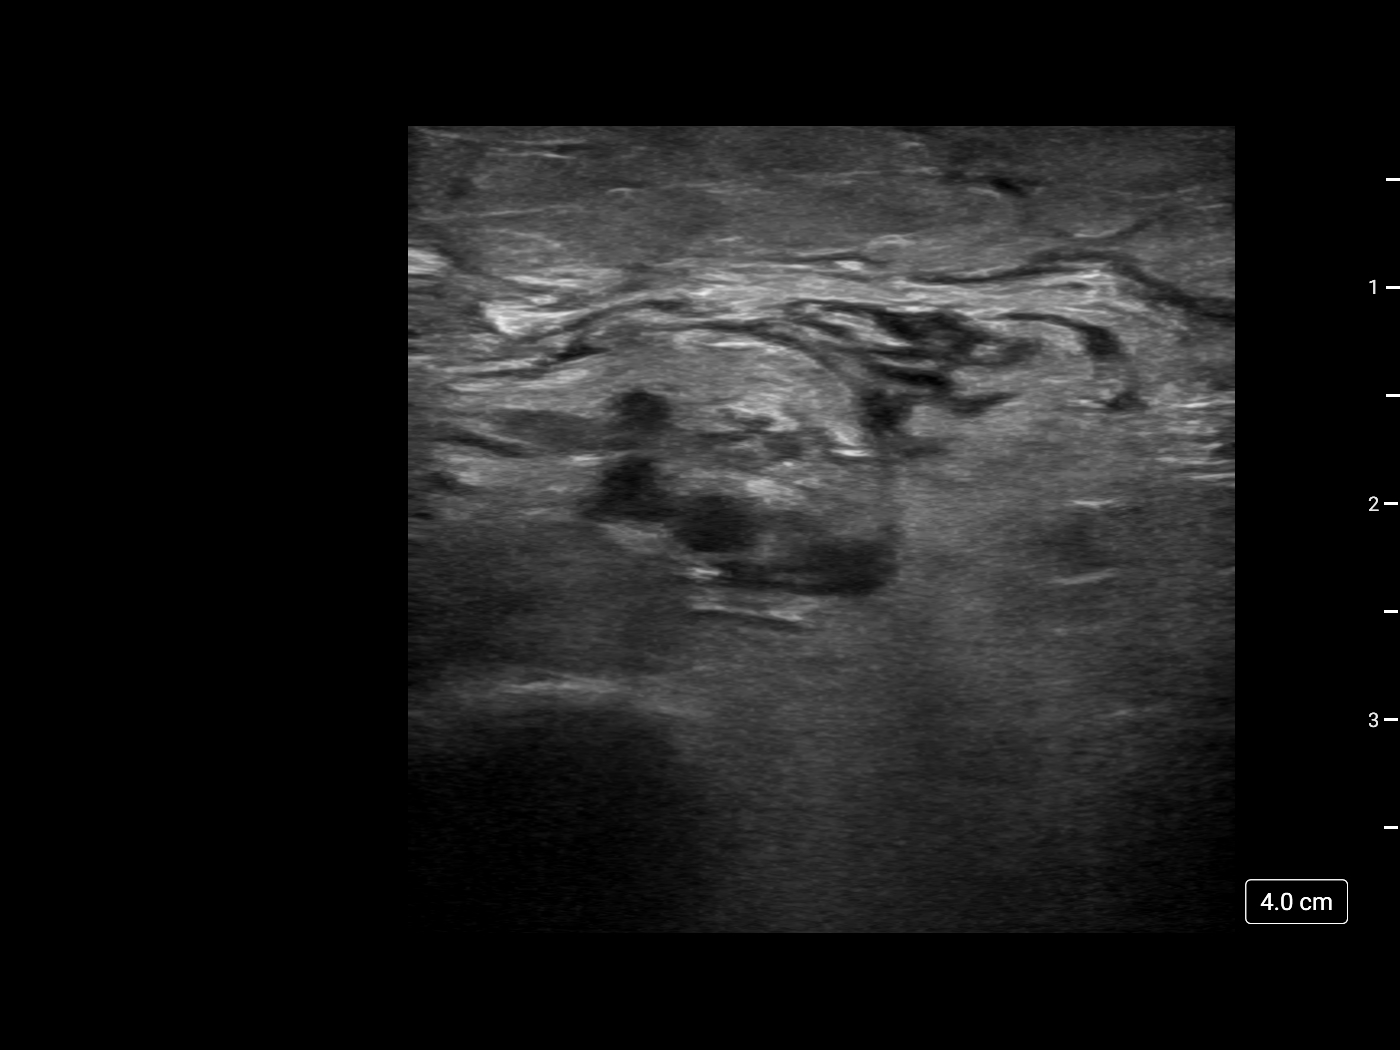

[1 of 1 positions shown; findings below may reference images not displayed]

Intravenous Fentanyl 47mcg and Versed 1mg were administered as
conscious sedation during continuous monitoring of the patient's
level of consciousness and physiological / cardiorespiratory status
by the radiology RN, with a total moderate sedation time of 32
minutes.

Overlying skin prepped with chlorhexidine, draped in usual sterile
fashion, infiltrated locally with 1% lidocaine. A small incision was
made over the scar from previous placement. The port catheter was
dissected free from the underlying soft tissues and removed intact.
Hemostasis was achieved. The port pocket was closed with deep
interrupted and subcuticular continuous 3-0 Monocryl sutures, then
covered with Dermabond. The patient tolerated the procedure well.

COMPLICATIONS:
COMPLICATIONS
None immediate
IMPRESSION: 1.  Technically successful tunneled Port catheter removal.

## 2021-12-10 IMAGING — US US EXTREM UP *R* LTD
1 series · 14 of 25 positions shown · non-contrast
Comparison: None.

CLINICAL DATA: SWELLING OF THE RIGHT ARM.

EXAM:
ULTRASOUND RIGHT UPPER EXTREMITY LIMITED
TECHNIQUE: Ultrasound examination of the upper extremity soft tissues was
performed in the area of clinical concern.

[Series 1: us extrem up *right* ltd · 27 acquisitions, 14 frames shown]
[im 1/27]
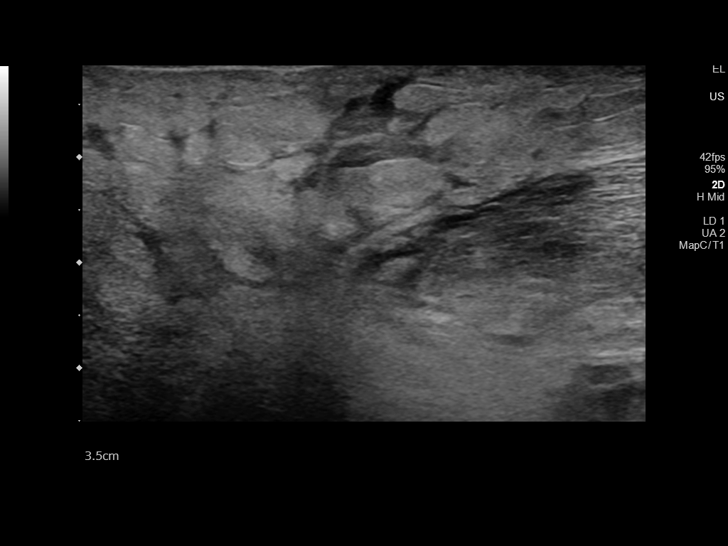
[im 3/27]
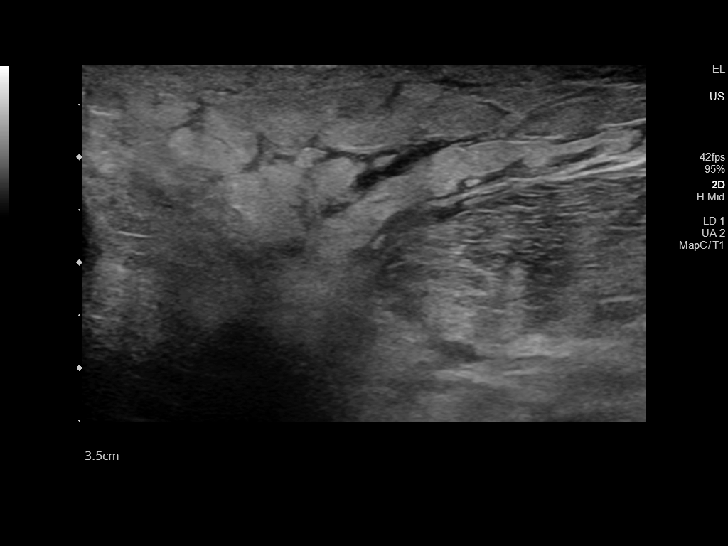
[im 5/27]
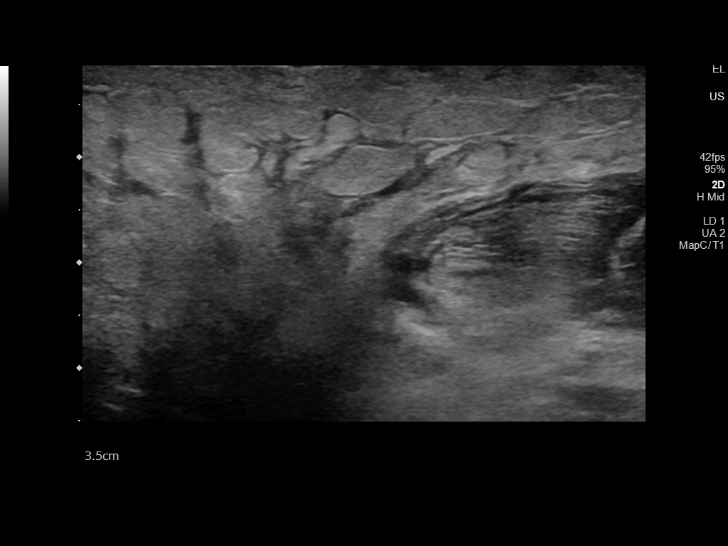
[im 7/27]
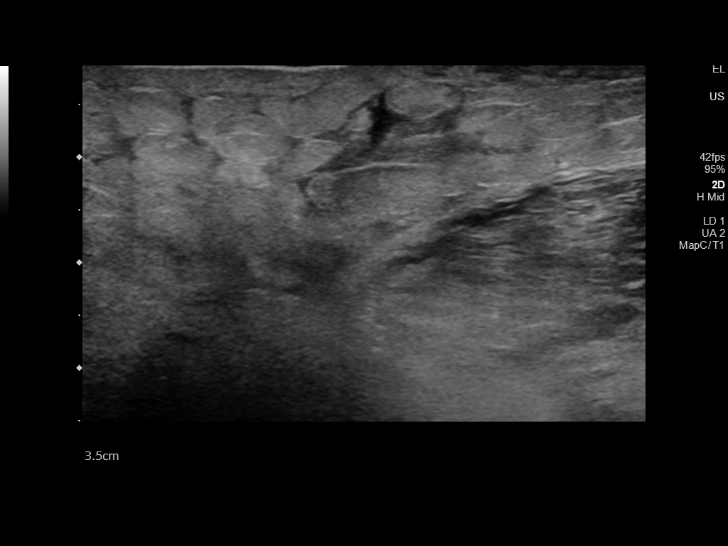
[im 9/27]
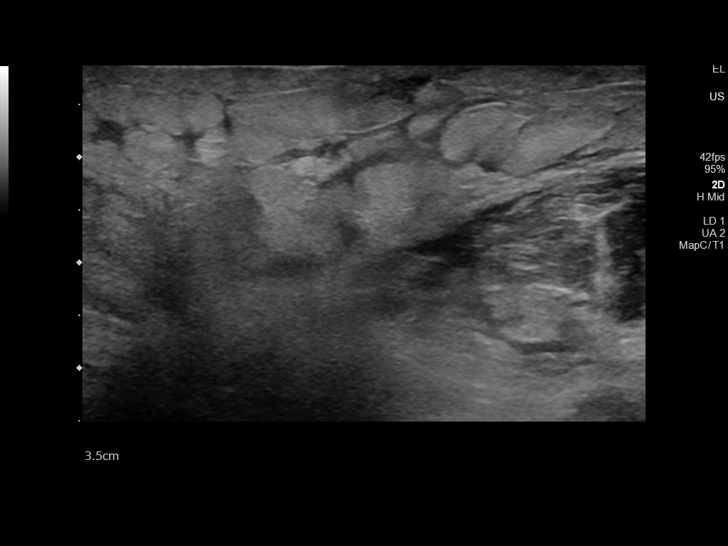
[im 10/27]
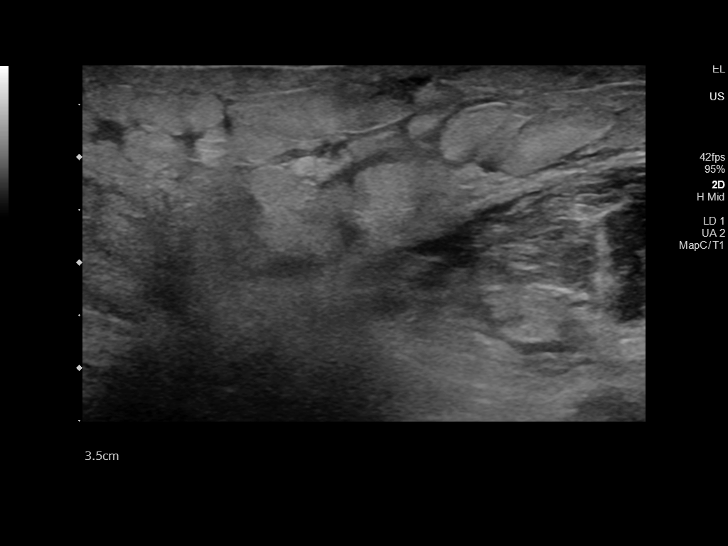
[im 12/27]
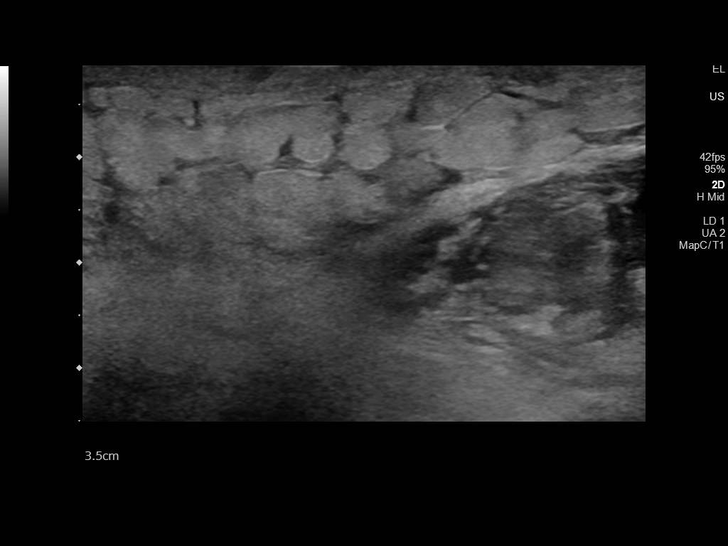
[im 15/27]
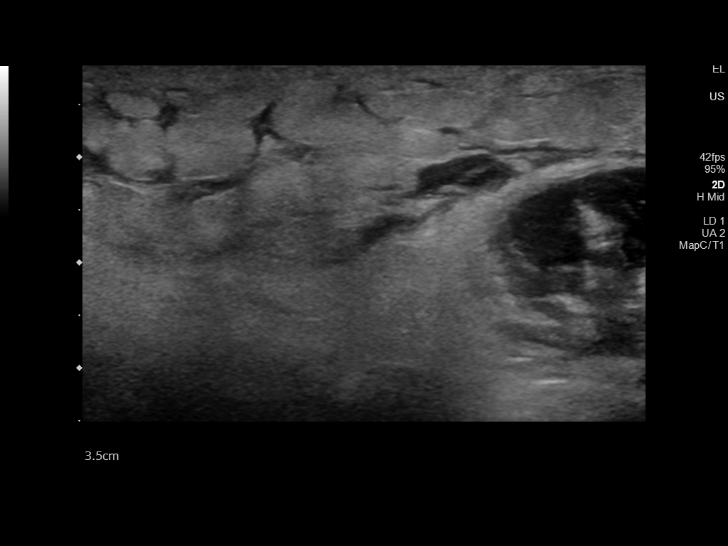
[im 17/27]
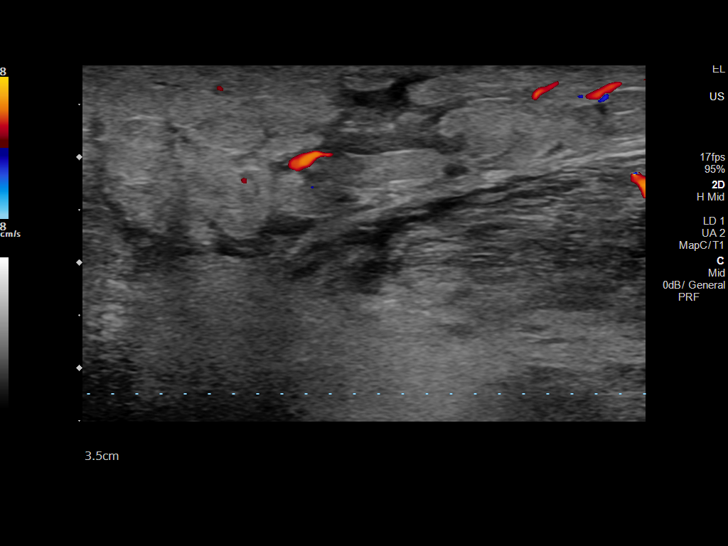
[im 18/27]
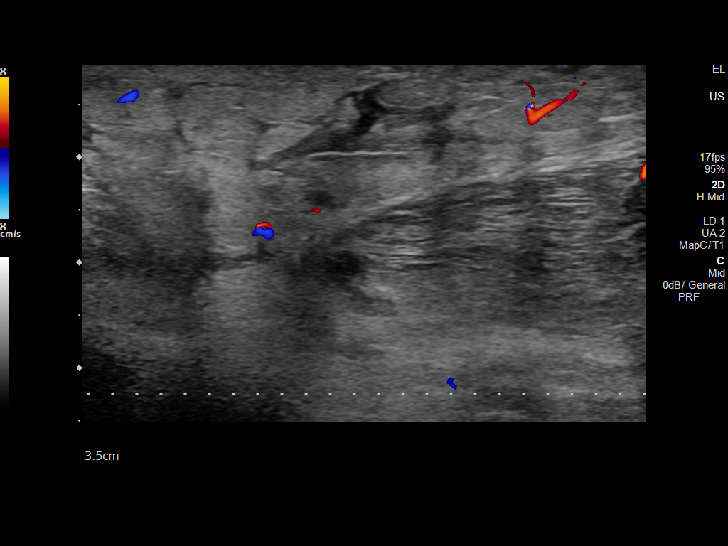
[im 20/27]
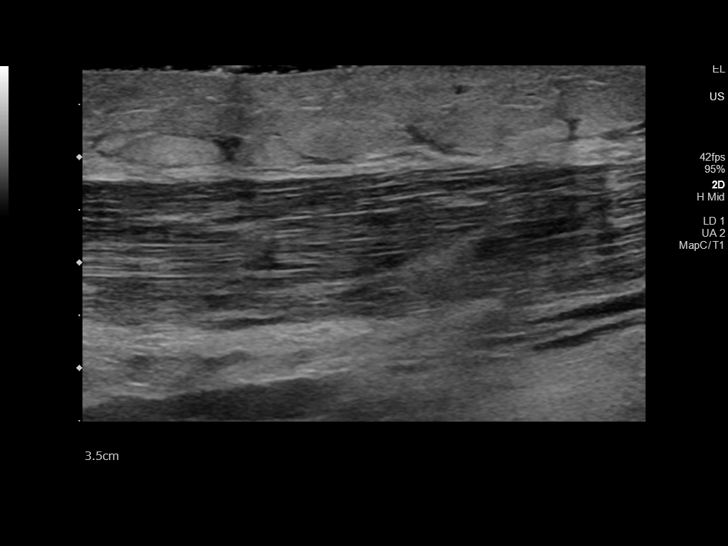
[im 22/27]
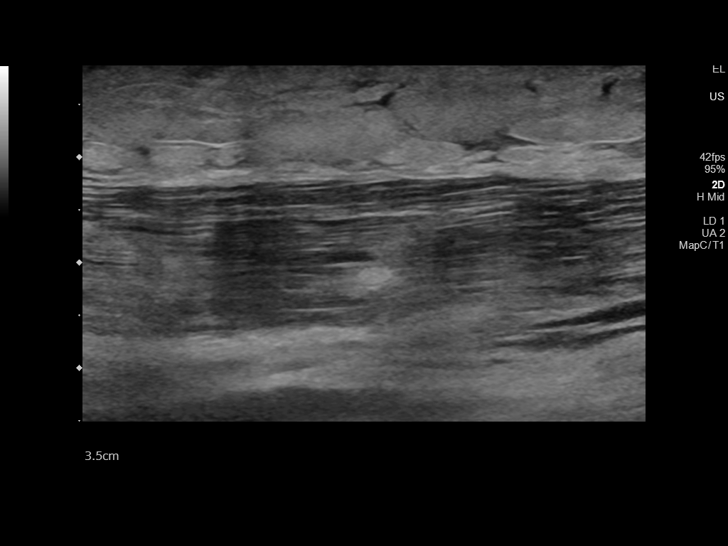
[im 24/27]
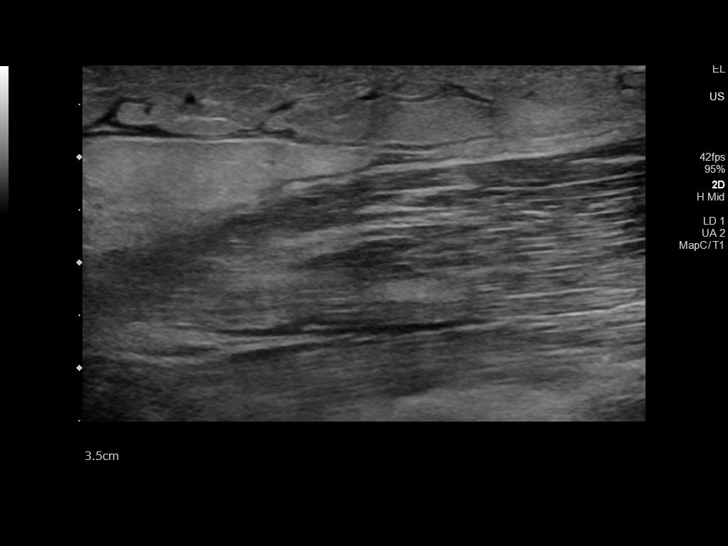
[im 27/27]
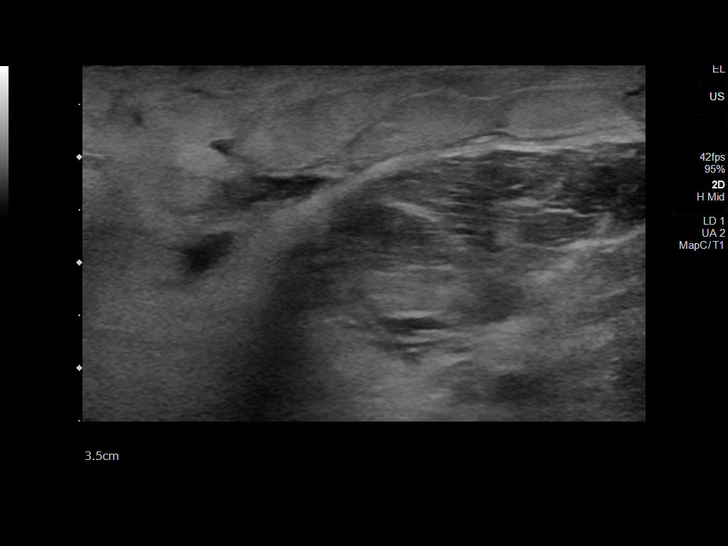

[14 of 25 positions shown; findings below may reference images not displayed]

FINDINGS: There is a small amount of edema in the subcutaneous fat of the
anterior aspect of the left upper arm. There is also a small amount
of fluid surrounding the distal biceps muscle and proximal aspect of
the distal biceps tendon.

No discrete abscess.
IMPRESSION: No discrete abscess. Nonspecific subcutaneous edema as well as edema
around the distal biceps muscle and proximal aspect of the a distal
biceps tendon.

## 2021-12-10 IMAGING — DX DG CHEST 1V
1 series · 1 of 1 positions shown · non-contrast
Comparison: 04/21/2019

CLINICAL DATA: Central line placement

EXAM:
CHEST  1 VIEW

[chest ap]
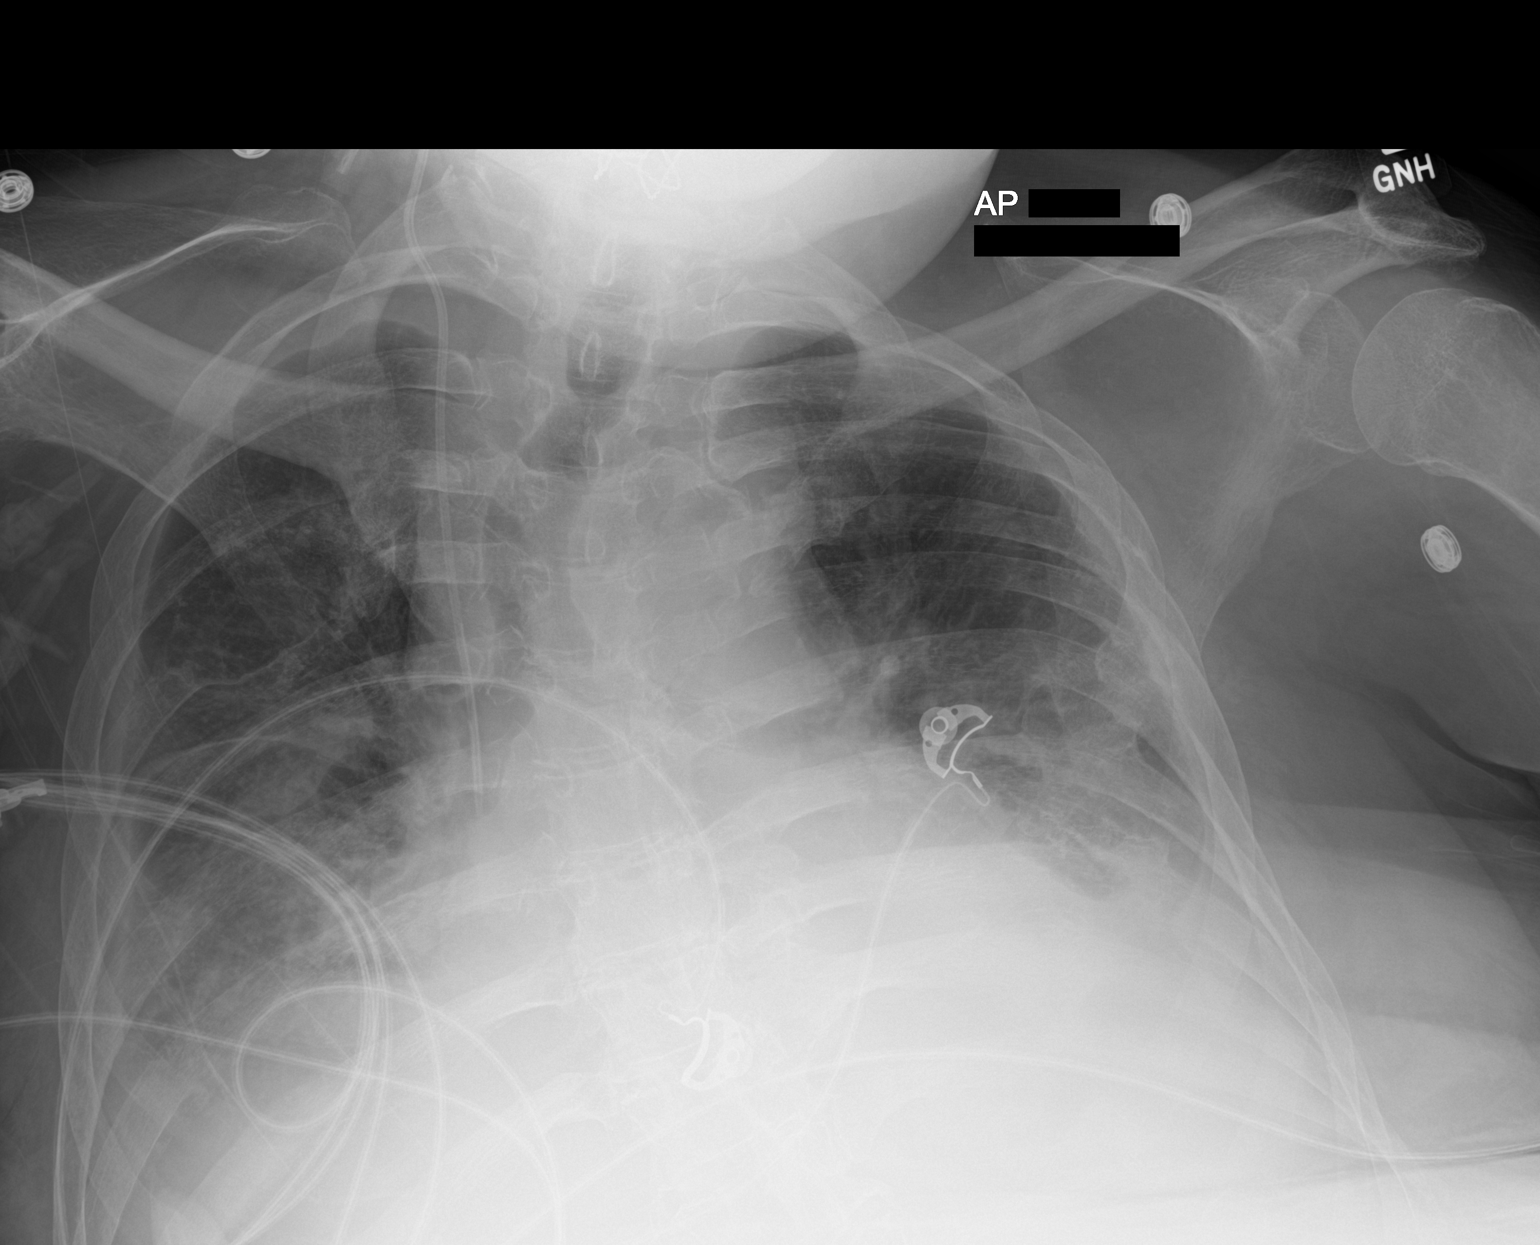

[1 of 1 positions shown; findings below may reference images not displayed]

FINDINGS: Interval placement of a right neck vascular catheter, tip positioned
near the superior cavoatrial junction. There are new, layering
bilateral pleural effusions and associated atelectasis or
consolidation. Cardiomegaly. Unchanged, rim calcified in expansile
mass of the posterior right chest wall, better assessed by prior CT.
IMPRESSION: 1. Interval placement of right neck vascular catheter, tip
positioned near the superior cavoatrial junction.
2. New, layering bilateral pleural effusions with associated
atelectasis or consolidation.
3. Cardiomegaly.

## 2021-12-11 IMAGING — US IR PICC >5YO
1 series · 2 of 2 positions shown · non-contrast
Comparison: none

INDICATION: 53-year-old male quadriplegic with poor venous access. He recently
had a right chest port catheter removed for bacteremia and requires
line replacement for IV antibiotic therapy. He presents for
placement of a PICC.

[Series 1: (phone_number) · 2 of 2 slices shown]
[im 1/2]
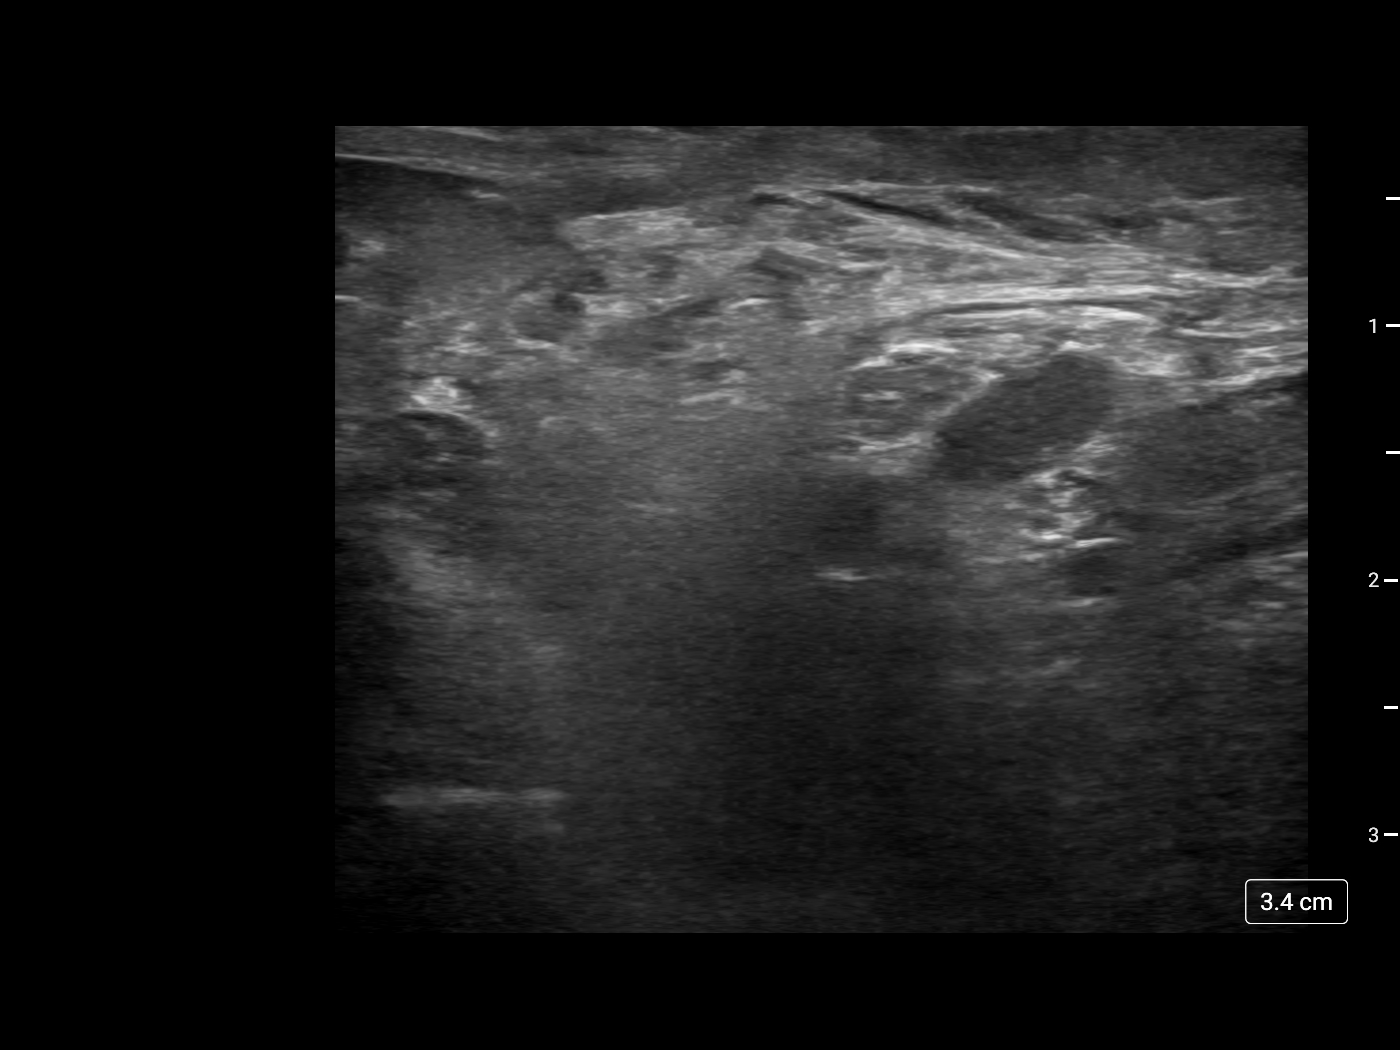
[im 2/2]
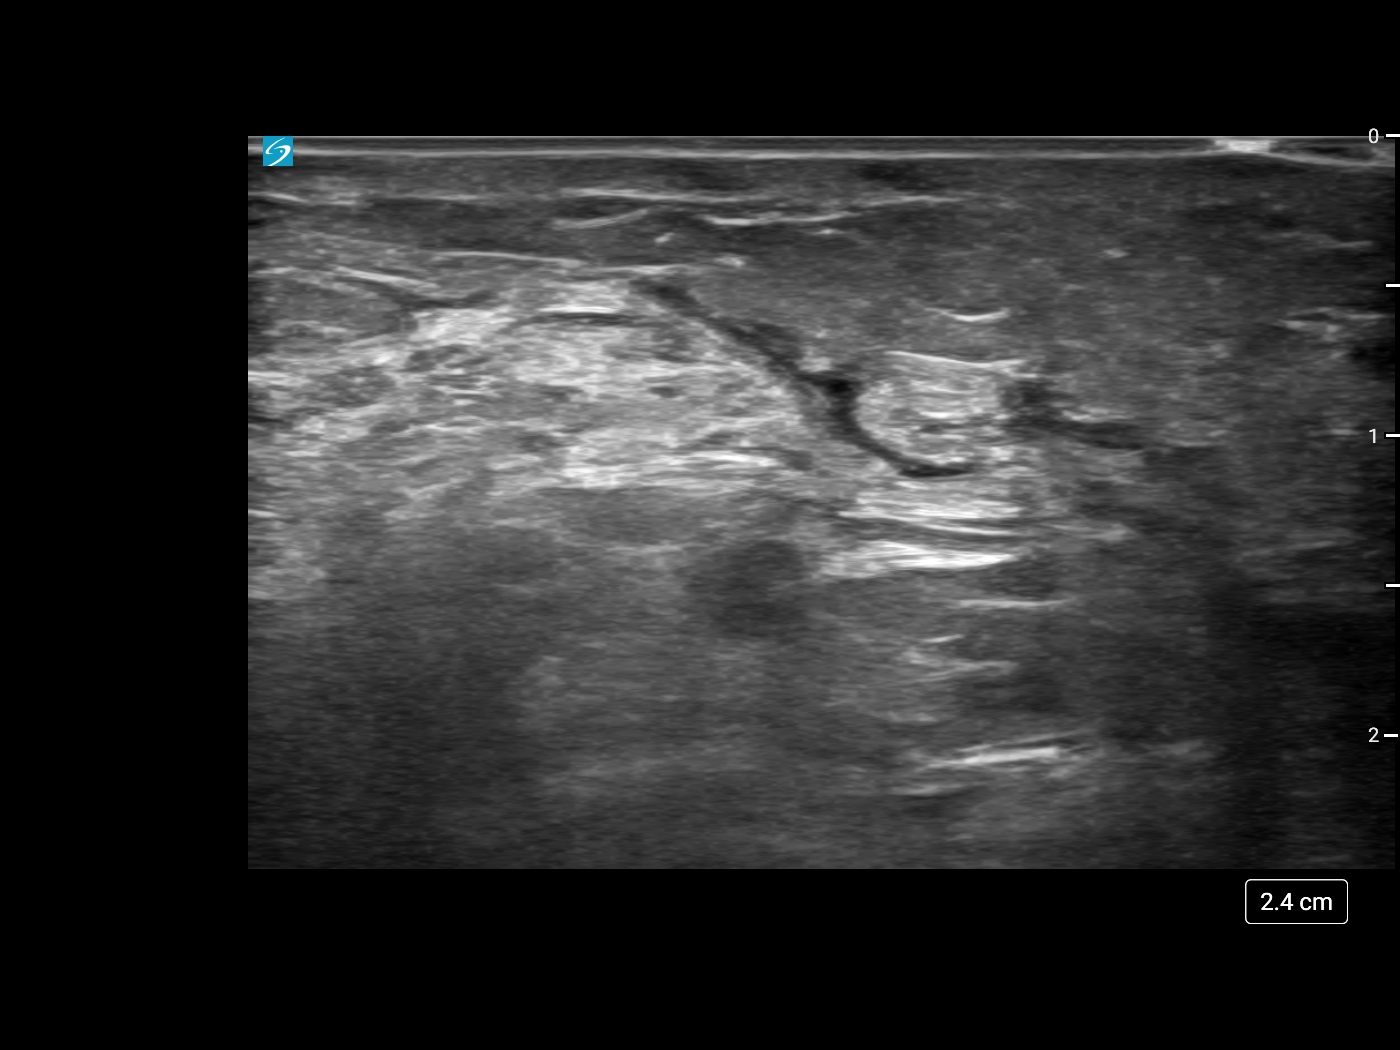

[2 of 2 positions shown; findings below may reference images not displayed]

EXAM:
PICC LINE PLACEMENT WITH ULTRASOUND AND FLUOROSCOPIC GUIDANCE

MEDICATIONS:
None.

ANESTHESIA/SEDATION:
None.

FLUOROSCOPY TIME:  Fluoroscopy Time: 0 minutes 42 seconds (3.8 mGy).

COMPLICATIONS:
None immediate.

PROCEDURE:
The patient was advised of the possible risks and complications and
agreed to undergo the procedure. The patient was then brought to the
angiographic suite for the procedure.

The right arm was prepped with chlorhexidine, draped in the usual
sterile fashion using maximum barrier technique (cap and mask,
sterile gown, sterile gloves, large sterile sheet, hand hygiene and
cutaneous antisepsis) and infiltrated locally with 1% Lidocaine.

Ultrasound demonstrated patency of the right basilic vein, and this
was documented with an image. Under real-time ultrasound guidance,
this vein was accessed with a 21 gauge micropuncture needle and
image documentation was performed. A [DATE] wire was introduced in to
the vein. Over this, a 6 French dual lumen power-injectable PICC was
advanced to the lower SVC/right atrial junction. Fluoroscopy during
the procedure and fluoro spot radiograph confirms appropriate
catheter position. The catheter was flushed and covered with a
sterile dressing.

Catheter length: 34 cm
IMPRESSION: Successful right arm Power PICC line placement with ultrasound and
fluoroscopic guidance. The catheter is ready for use.

## 2021-12-13 IMAGING — DX DG CHEST 1V
1 series · 1 of 1 positions shown · non-contrast
Comparison: April 28, 2019

CLINICAL DATA: [id number] positive patient.  Quadriplegia.

EXAM:
CHEST  1 VIEW

[chest ap]
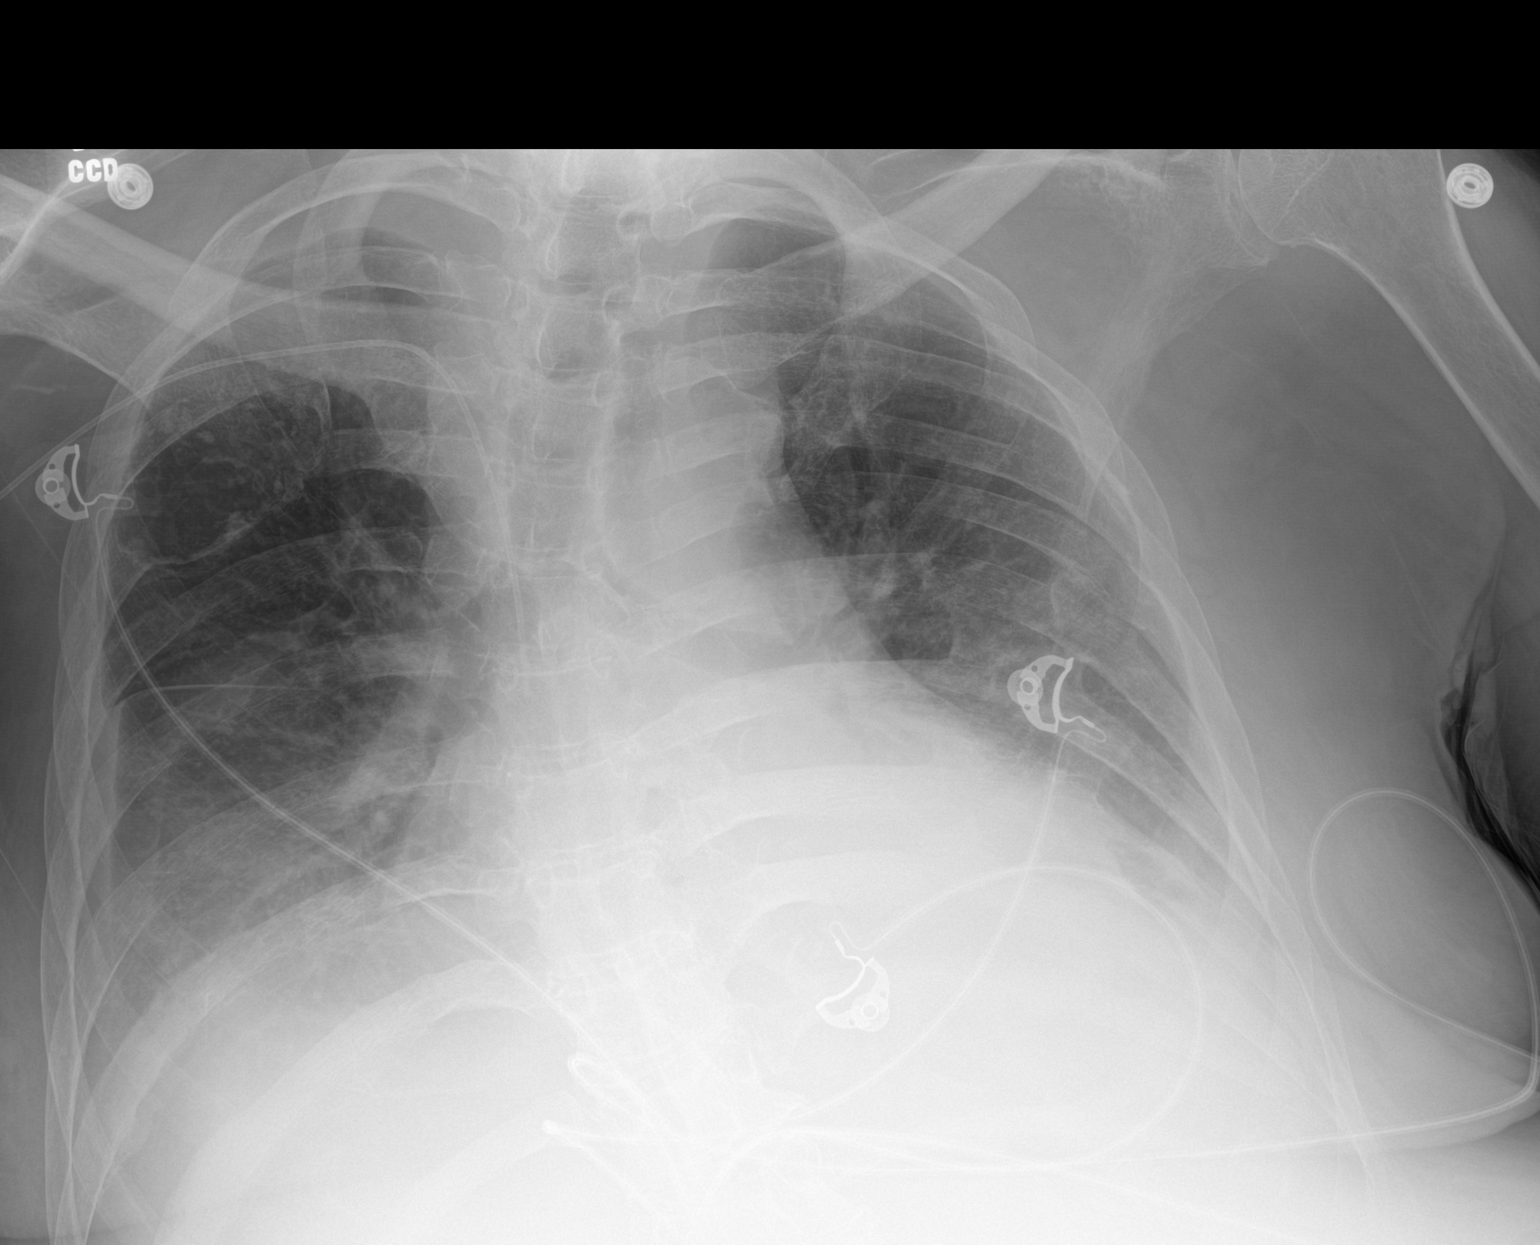

[1 of 1 positions shown; findings below may reference images not displayed]

FINDINGS: The right central line is been removed. A right PICC line is been
placed, terminating in the central SVC. A layering effusion on the
right is again identified with underlying opacity. Mild opacity in
the left perihilar and retrocardiac regions are stable. The
cardiomediastinal silhouette is stable. No other acute
abnormalities.
IMPRESSION: 1. A new right PICC line is in good position terminating in the
central SVC.
2. Probable small bilateral layering effusions. Opacity underlies
the right effusion, stable. Mild left perihilar and retrocardiac
opacities are stable as well.

## 2021-12-14 IMAGING — CT CT CHEST W/O CM
2 of 3 series · 15 of 36 positions shown, 18 images · non-contrast
Comparison: None

CLINICAL DATA: Quadriplegia, prior C5 fracture, pneumonia, pleural
effusion, tachycardia, leukocytosis, sepsis

EXAM:
CT CHEST WITHOUT CONTRAST
TECHNIQUE: Multidetector CT imaging of the chest was performed following the
standard protocol without IV contrast. Sagittal and coronal MPR
images reconstructed from axial data set.

[Series 2: thorax · axial · 0.73mm/px · z∈[-311,-57]mm · 12 of 149 slices shown, 15 images]
[im 11/149  mediastinal]
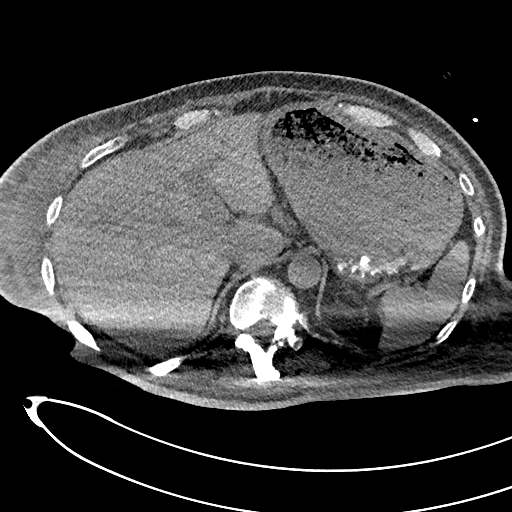
[im 11/149  lung]
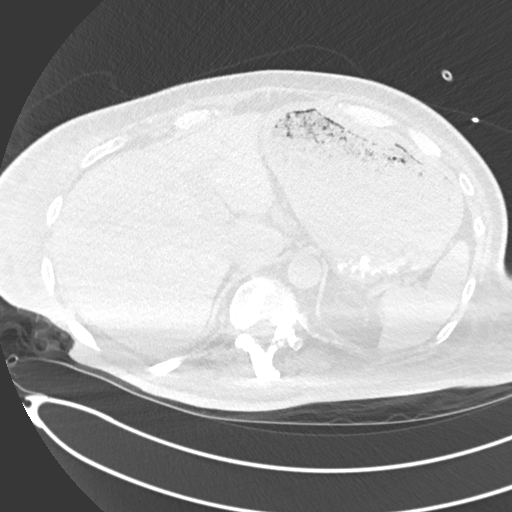
[im 22/149  lung]
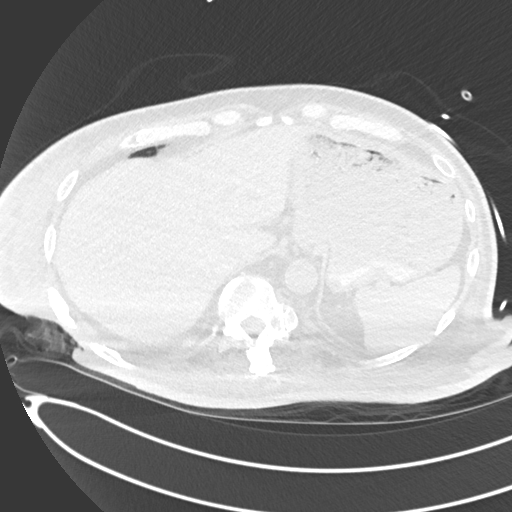
[im 33/149  lung]
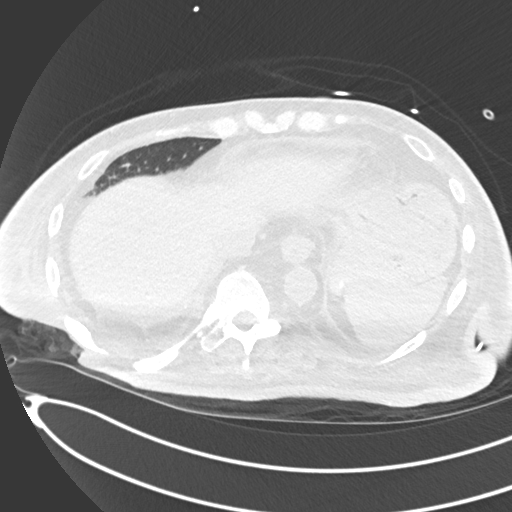
[im 44/149  lung]
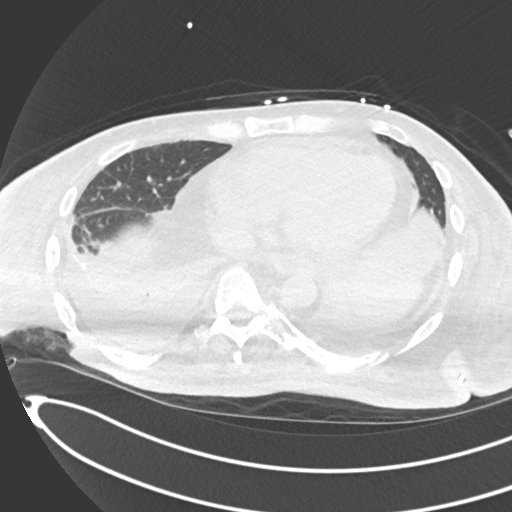
[im 55/149  mediastinal]
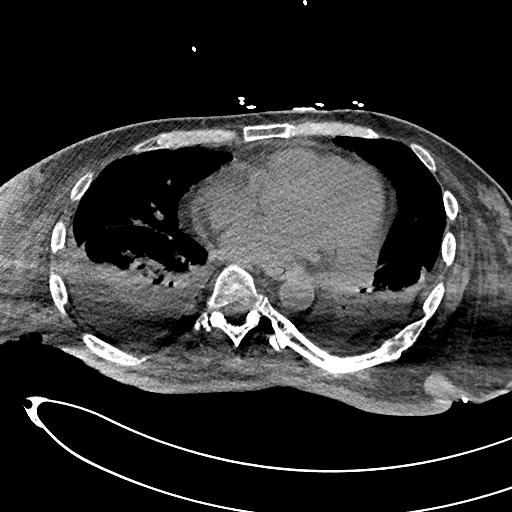
[im 55/149  lung]
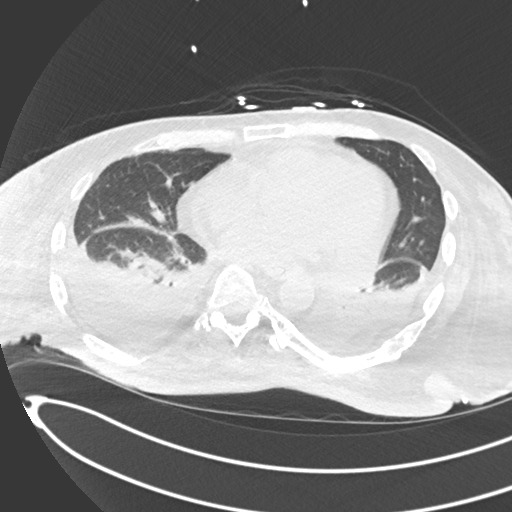
[im 66/149  lung]
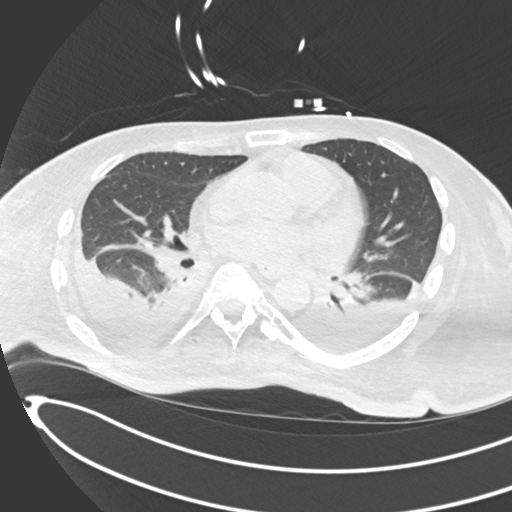
[im 83/149  lung]
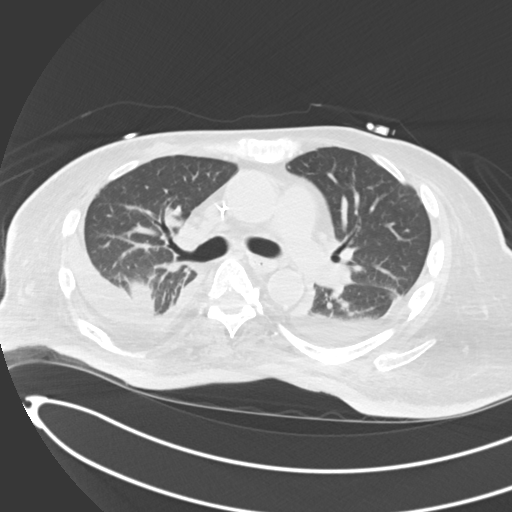
[im 94/149  lung]
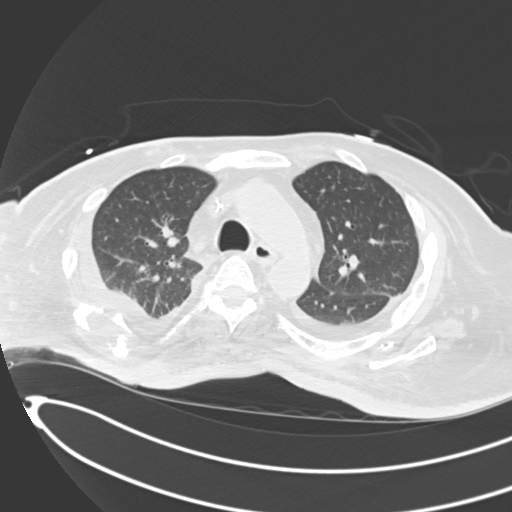
[im 105/149  mediastinal]
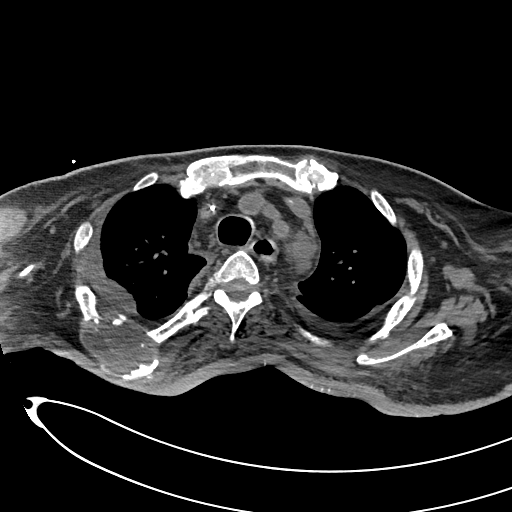
[im 105/149  lung]
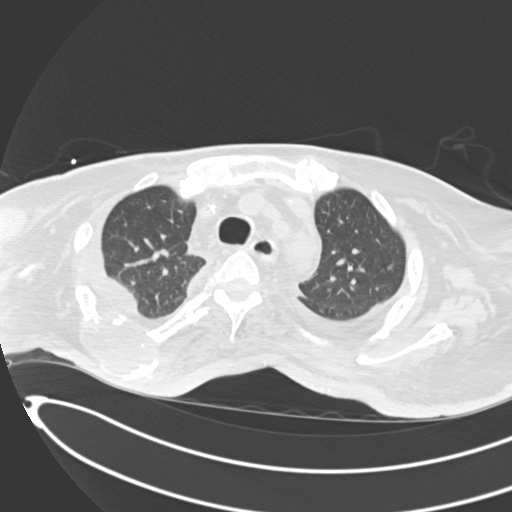
[im 116/149  lung]
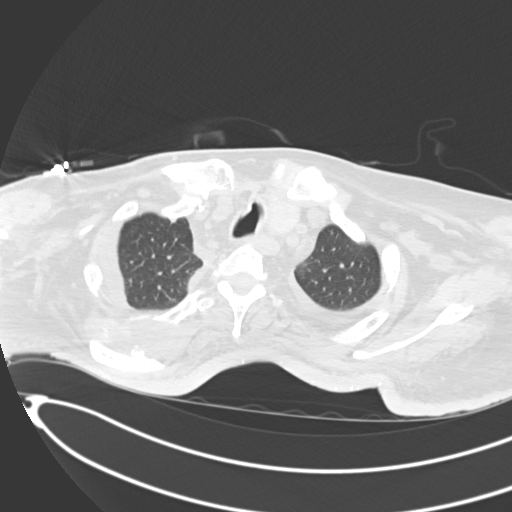
[im 127/149  lung]
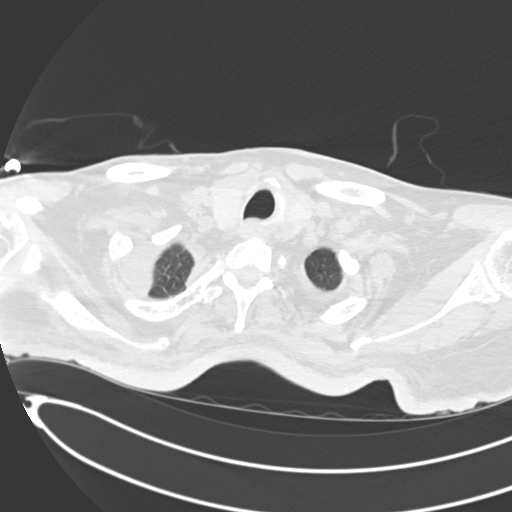
[im 138/149  lung]
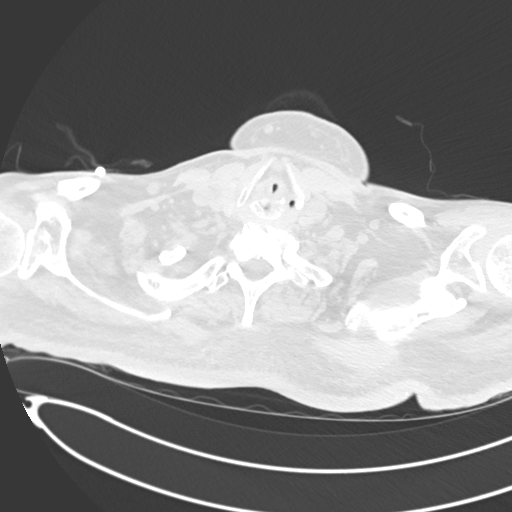

[Series 6: coronal · coronal · 0.61mm/px · 3 of 112 slices shown]
[im 23/112  lung]
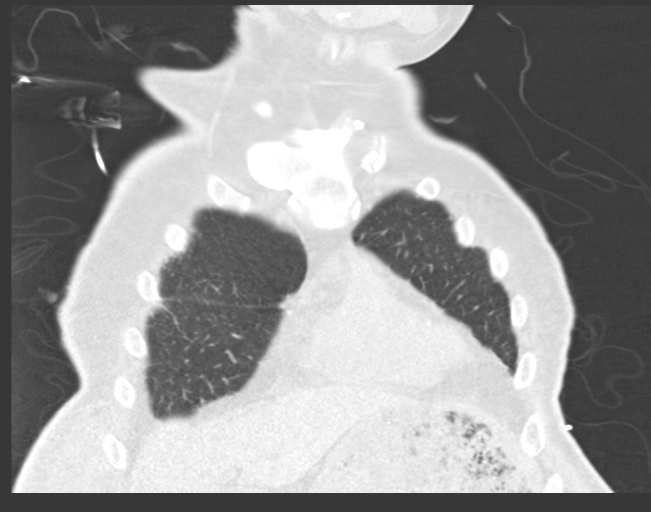
[im 45/112  lung]
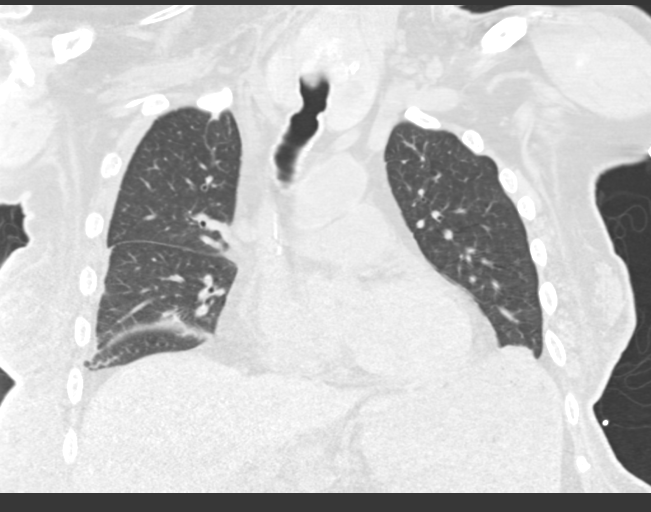
[im 67/112  lung]
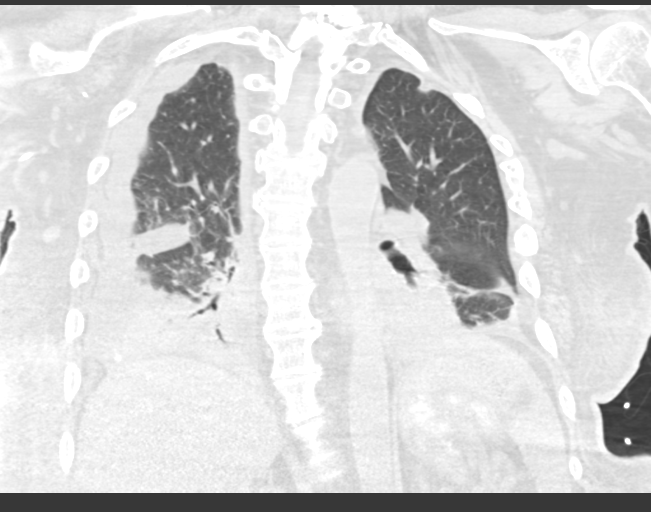

[15 of 36 positions shown; findings below may reference images not displayed]

FINDINGS: Cardiovascular: Minimal enlargement of cardiac silhouette and
minimal pericardial effusion. Ascending thoracic aorta upper normal
caliber 3.7 cm diameter. Minimal atherosclerotic calcification
aorta.

Mediastinum/Nodes: Questionable wall thickening of the mid to distal
esophagus cannot exclude esophagitis or reflux. LEFT thyroid nodule
2.0 x 1.6 cm. Base of cervical region otherwise normal appearance.
No thoracic adenopathy. Few normal size mediastinal lymph nodes
seen.

Lungs/Pleura: Small LEFT pleural effusion. Partially loculated small
RIGHT pleural effusion. Compressive atelectasis of the lower lobes
cannot exclude underlying consolidation. Minimal compressive
atelectasis of the posterior RIGHT upper lobe. Remaining lungs
clear. No pneumothorax or definite pulmonary mass.

Upper Abdomen: Visualized upper abdomen unremarkable

Musculoskeletal: Diffuse osseous demineralization. Sternum and spine
unremarkable. Expansile destructive bone lesion again identified at
the posterior RIGHT fifth rib at 5.9 x 4.2 x. 4.6 cm not
significantly changed. Old LEFT rib fractures. Question old RIGHT
rib fractures as well. Degenerative changes of the spine with
postsurgical changes at C5.
IMPRESSION: Small BILATERAL pleural effusions, partially loculated on LEFT.

Associated compressive atelectasis of the posterior lungs greatest
in lower lobes, cannot exclude underlying consolidation especially
in RIGHT lower lobe.

Stable expansile bone lesion of the posterior RIGHT fifth rib.

LEFT thyroid nodule, previously assessed by thyroid ultrasound and
biopsy in 7007, recommend correlation with prior imaging and
cytology results.

Minimal pericardial effusion.

Aortic Atherosclerosis ([id number]-ZHQ.Q).

## 2021-12-22 IMAGING — DX DG CHEST 1V PORT
1 series · 1 of 1 positions shown · non-contrast
Comparison: May 02, 2019

CLINICAL DATA: Sepsis

EXAM:
PORTABLE CHEST 1 VIEW

[chest]
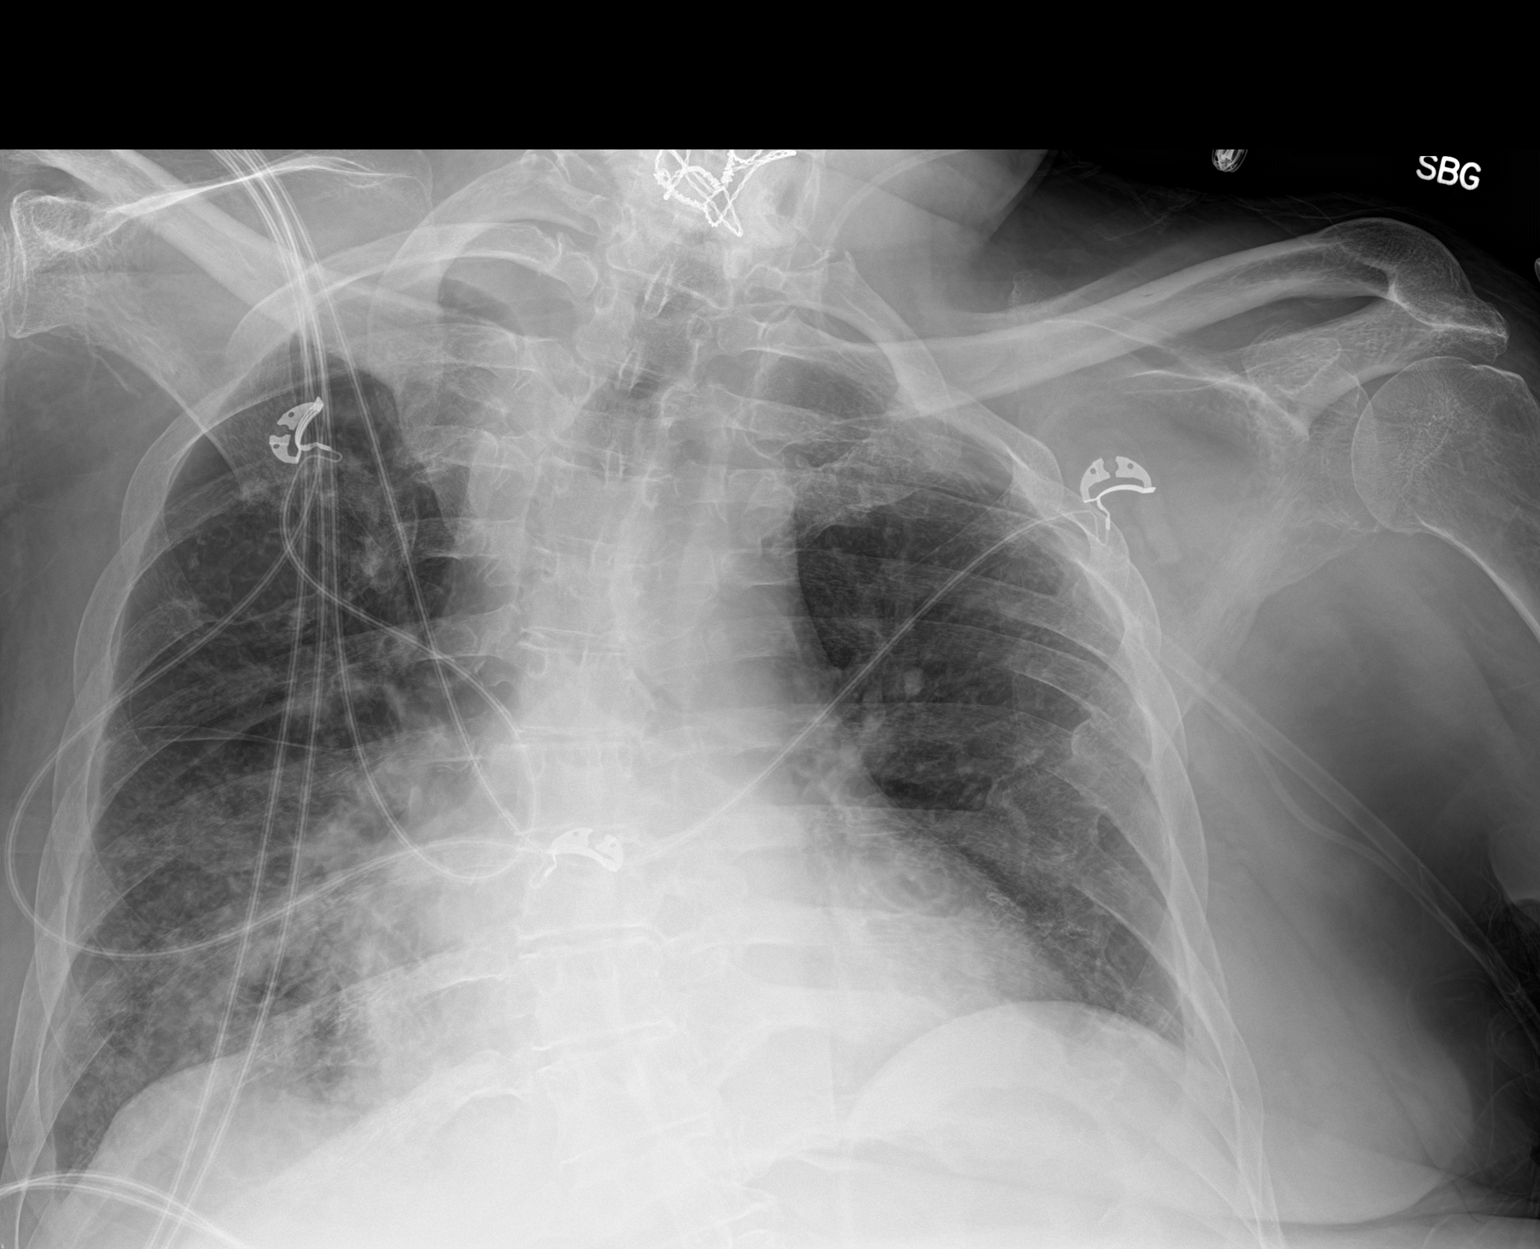

[1 of 1 positions shown; findings below may reference images not displayed]

FINDINGS: The heart size and mediastinal contours unchanged. Patchy/hazy
airspace opacity seen at the right lung base, with slight interval
worsening on the prior exam. The left lung appears to be well
aerated. Healed posterior left rib fractures are noted.
IMPRESSION: Interval slight worsening in the patchy airspace opacity at the
right lung base which could be due to infectious etiology.

## 2021-12-30 IMAGING — DX DG CHEST 1V PORT
1 series · 1 of 1 positions shown · non-contrast
Comparison: Chest radiograph 05/10/2019

CLINICAL DATA: Update status fever

EXAM:
PORTABLE CHEST 1 VIEW

[chest ap]
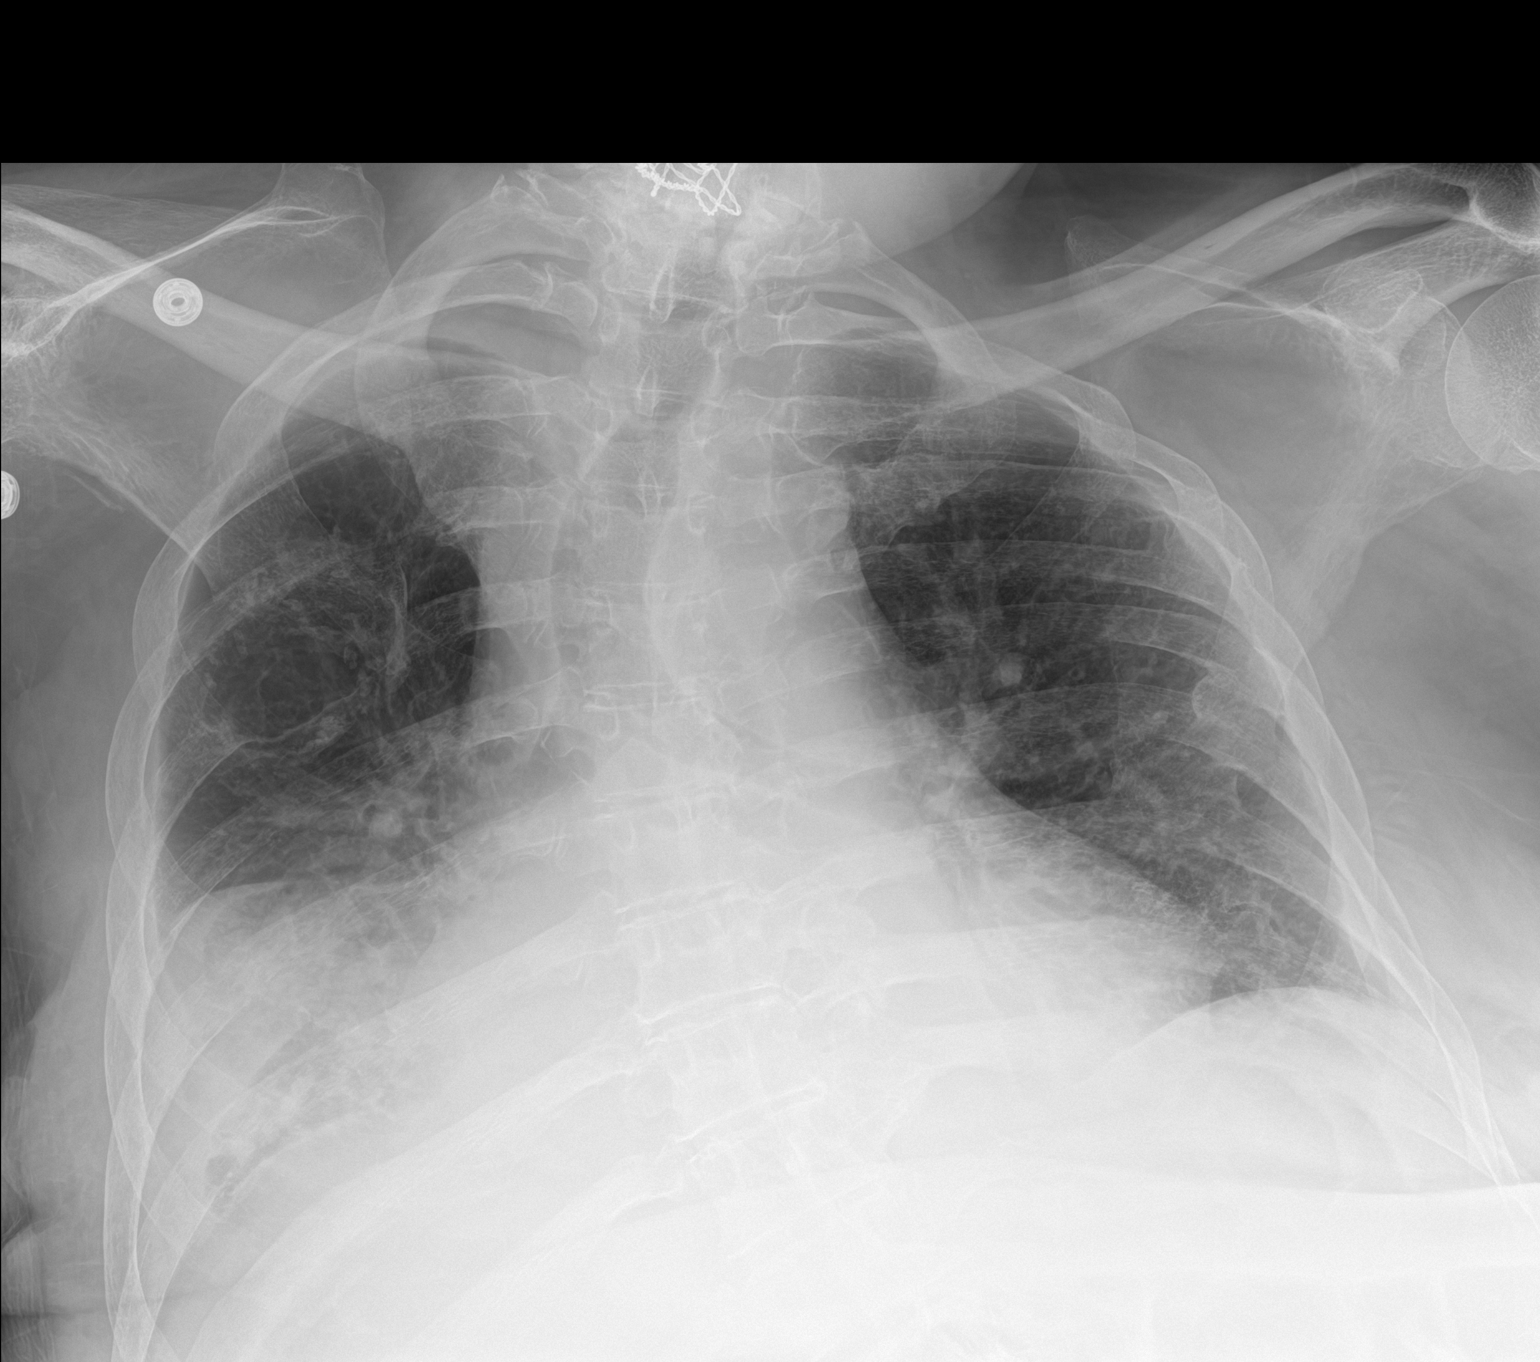

[1 of 1 positions shown; findings below may reference images not displayed]

FINDINGS: Stable cardiomediastinal contours with enlarged heart size.
Increased consolidation at the right lung base with small to
moderate volume right pleural fluid. The left lung is clear. No
pneumothorax.
IMPRESSION: Increased consolidation at the right lung base with small to
moderate volume right pleural fluid.

## 2022-01-01 IMAGING — CT CT ABD-PELV W/O CM
4 of 6 series · 15 of 46 positions shown, 16 images · non-contrast
Comparison: Radiographs 05/18/2019 and 05/16/2019. Chest CT
05/02/2019. Abdominal CT 03/05/2019.

CLINICAL DATA: Fever and abdominal pain. Abnormal chest x-ray.
Clostridium difficile colitis.

EXAM:
CT CHEST, ABDOMEN AND PELVIS WITHOUT CONTRAST
TECHNIQUE: Multidetector CT imaging of the chest, abdomen and pelvis was
performed following the standard protocol without IV contrast.

[Series 3: cap w/o 5.0 mm st · axial · non-contrast · 0.95mm/px · z∈[-476,-251]mm · 2 of 136 slices shown]
[im 46/136  soft-tissue]
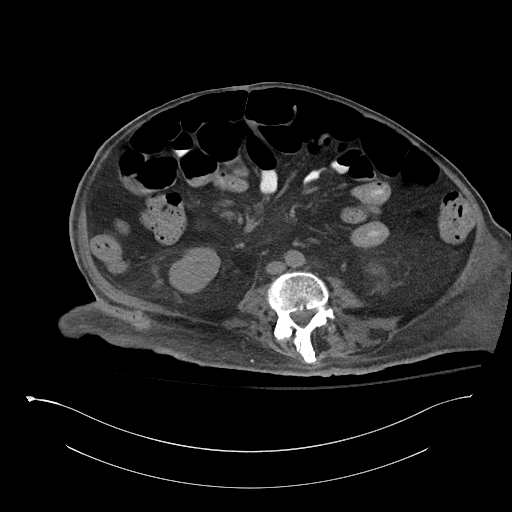
[im 91/136  soft-tissue]
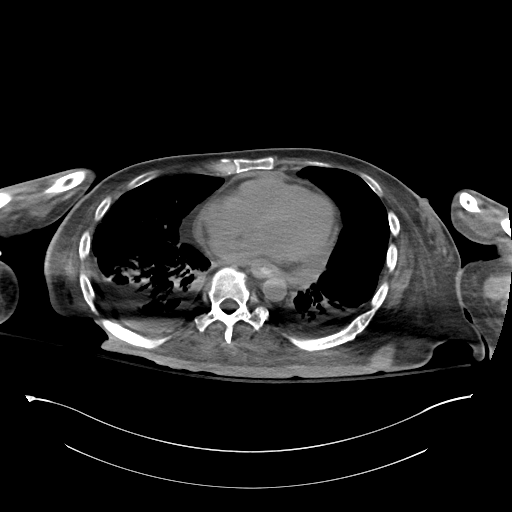

[Series 5: cap w/o 3.0 mm st cor · coronal · non-contrast · 0.95mm/px · 3 of 105 slices shown]
[im 35/105  soft-tissue]
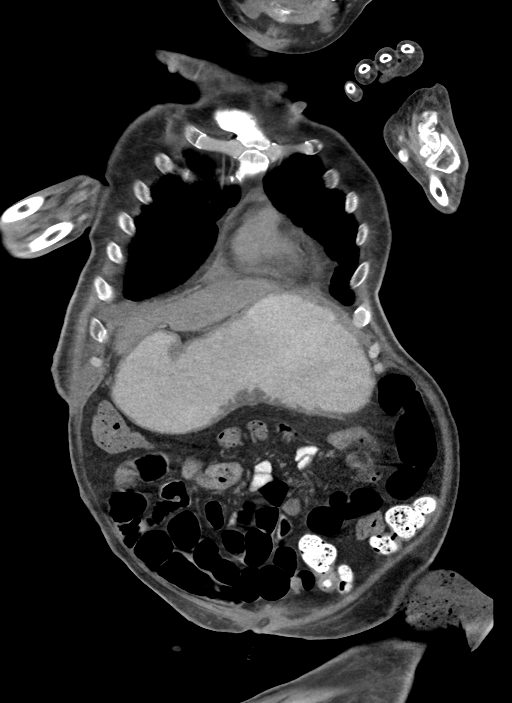
[im 47/105  soft-tissue]
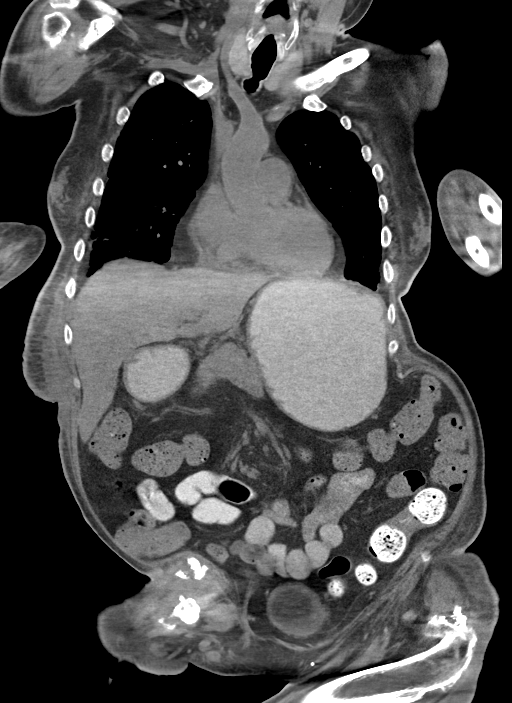
[im 58/105  soft-tissue]
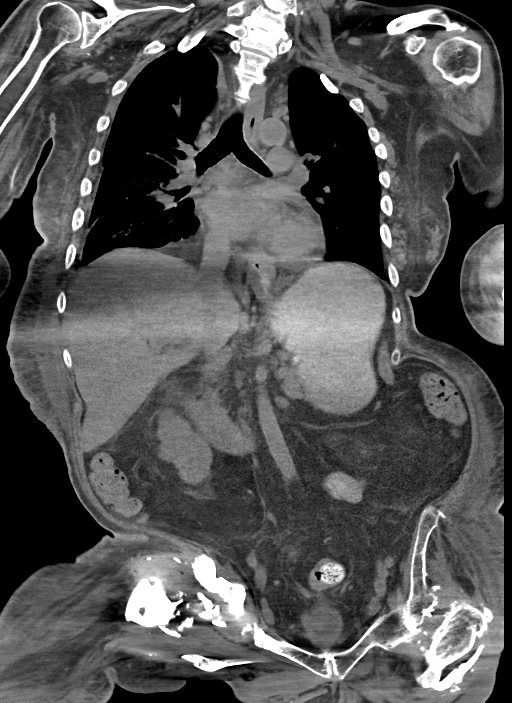

[Series 7: cap w/o 2.0 mm st · axial · non-contrast · 0.95mm/px · z∈[-620,-112]mm · 7 of 340 slices shown, 8 images]
[im 43/340  soft-tissue]
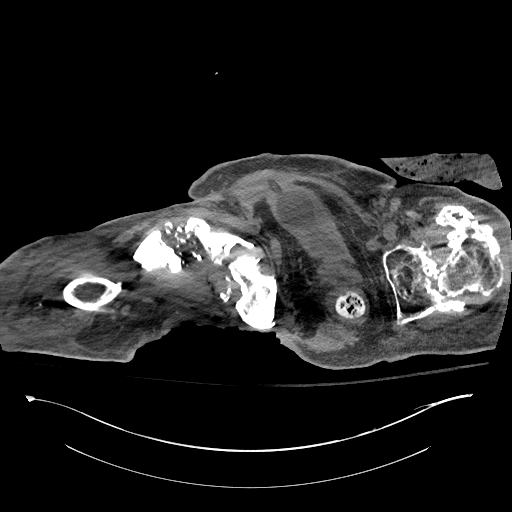
[im 43/340  bone]
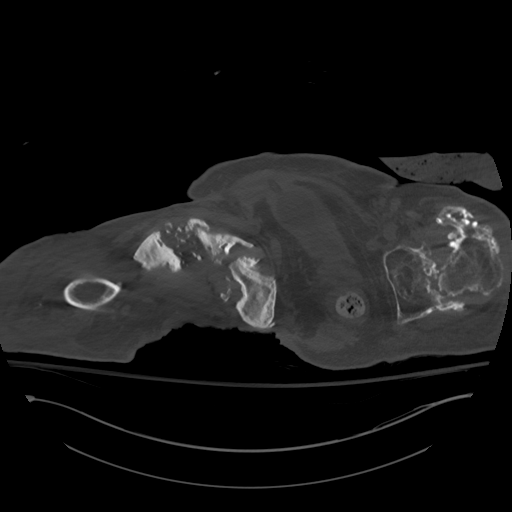
[im 85/340  soft-tissue]
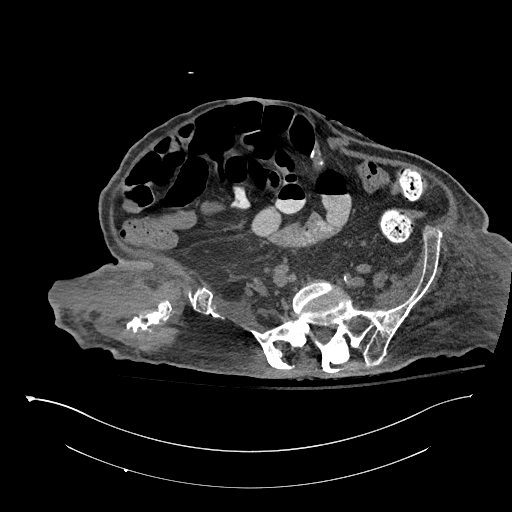
[im 128/340  soft-tissue]
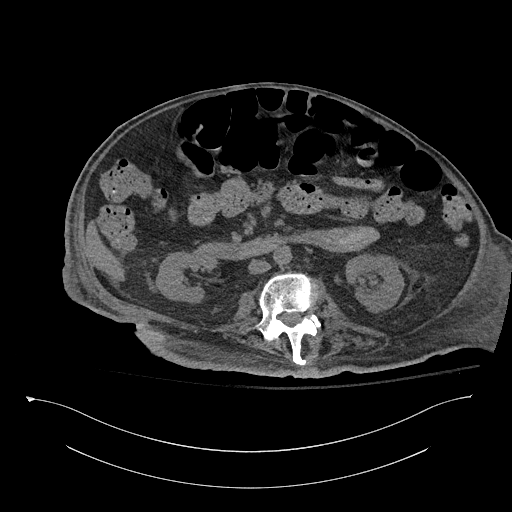
[im 170/340  soft-tissue]
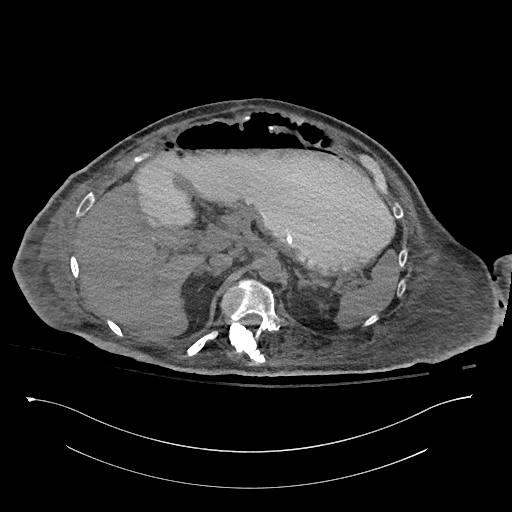
[im 212/340  soft-tissue]
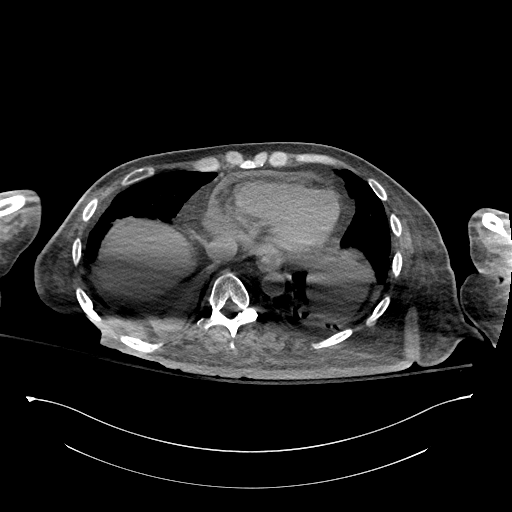
[im 255/340  soft-tissue]
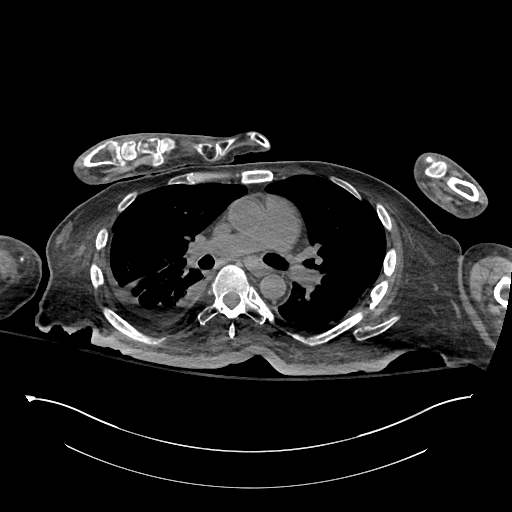
[im 297/340  soft-tissue]
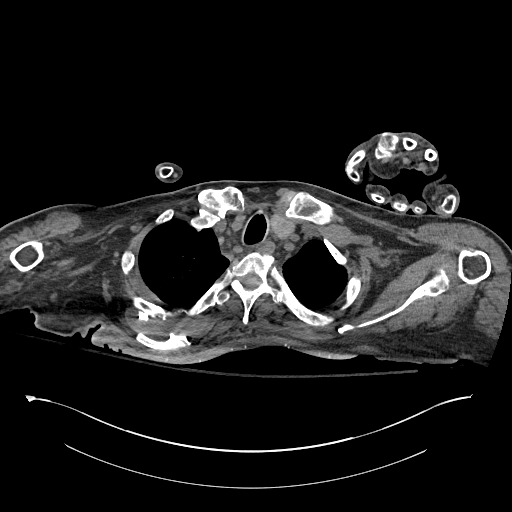

[Series 8: chest 1.0 mm st · axial · 0.98mm/px · z∈[-449,-327]mm · 3 of 536 slices shown]
[im 39/536  soft-tissue]
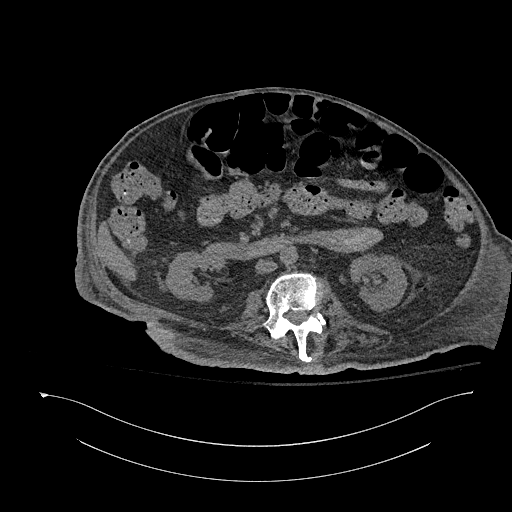
[im 115/536  soft-tissue]
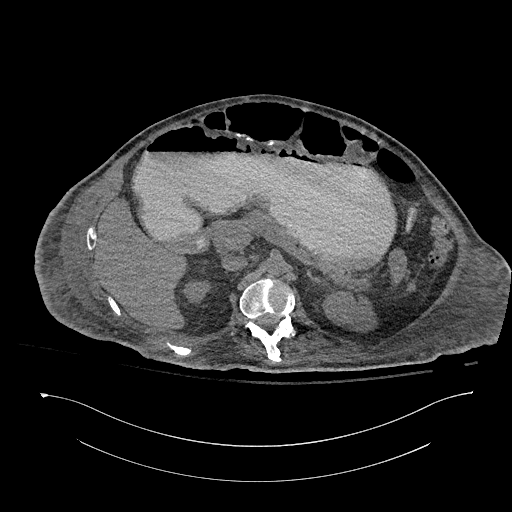
[im 192/536  soft-tissue]
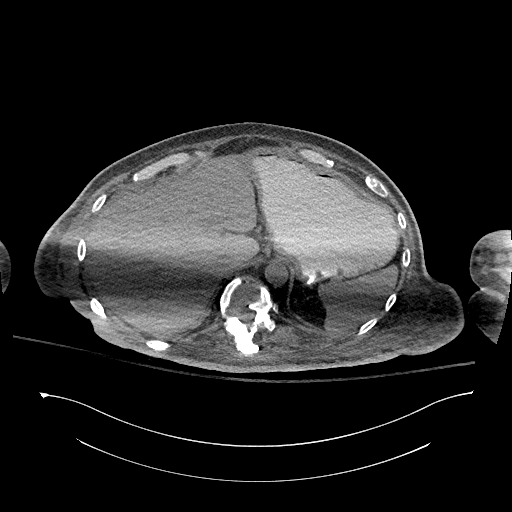

[15 of 46 positions shown; findings below may reference images not displayed]

FINDINGS: CT CHEST FINDINGS

Cardiovascular: No significant vascular findings on noncontrast
imaging. There is minimal aortic atherosclerosis. The heart size is
stable. There is a stable small pericardial effusion.

Mediastinum/Nodes: Small mediastinal lymph nodes are stable. There
is stable mild wall thickening of the distal esophagus. 2.1 cm left
thyroid nodule on image [DATE] is unchanged, previously evaluated by
ultrasound and biopsy.

Lungs/Pleura: Previously demonstrated bilateral pleural effusions
have improved. There are persistent airspace opacities in both lower
lobes with associated volume loss and central airway thickening. The
overall aeration of the lung bases has improved compared with the
prior CT.

Musculoskeletal/Chest wall: Stable expansile lesion involving the
posterior aspect of the right 5th rib with adjacent erosion of the
4th rib and scapula. Old left-sided rib fractures and thoracic
scoliosis noted. No acute osseous findings.

CT ABDOMEN AND PELVIS FINDINGS

Hepatobiliary: The liver appears unremarkable as imaged in the
noncontrast state. No significant biliary dilatation post
cholecystectomy.

Pancreas: Unremarkable. No pancreatic ductal dilatation or
surrounding inflammatory changes.

Spleen: Normal in size without focal abnormality.

Adrenals/Urinary Tract: Both adrenal glands appear normal. The
kidneys and ureters appear normal. No evidence of urinary tract
calculus or hydronephrosis. Patient has a suprapubic bladder
catheter associated with mild chronic bladder wall thickening.

Stomach/Bowel: No evidence of bowel wall thickening, distention or
surrounding inflammatory change. The stomach is mildly distended.
There is a widely patent gastrojejunostomy.

Vascular/Lymphatic: There are no enlarged abdominal lymph nodes.
Scattered prominent pelvic lymph nodes bilaterally are stable,
likely reactive. Mild aortoiliac atherosclerosis without acute
vascular findings on noncontrast imaging.

Reproductive: The prostate gland and seminal vesicles appear stable.

Other: No ascites or focal abdominal wall hernia identified.

Musculoskeletal: Chronic bilateral flank edema again noted. There is
extensive decubitus ulcer formation along the posterior aspect of
the lower right chest wall and both buttocks, right greater than
left. Probable osteomyelitis of the right 12th rib does not appear
progressive. There are extensive chronic deformities of the bony
pelvis and both hips. There are chronic bilateral hip dislocations
with chronic destruction of the right femoral head and chronic soft
tissue emphysema associated with the right hip joint. The lower
sacrum is chronically destroyed or previously resected. Overall
appearance is similar to previous CT from 03/05/2019, and no
progressive bone destruction identified.
IMPRESSION: 1. No acute findings or explanation for the patient's symptoms.
2. Improved bilateral pleural effusions with persistent bilateral
lower lobe airspace opacities suggesting chronic aspiration. These
findings have mildly improved compared with the most recent chest CT
of 2 weeks ago.
3. No acute intra-abdominal findings. No evidence of colitis or
bowel obstruction.
4. Extensive decubitus ulcer formation along the posterior aspect of
the lower right chest wall and both buttocks, right greater than
left. Chronic osteomyelitis of the right 12th rib does not appear
progressive. Stable chronic bilateral hip dislocations with chronic
destruction of the right femoral head and chronic soft tissue
emphysema associated with the right hip joint.

## 2022-01-31 IMAGING — US IR PICC >5YO
1 series · 1 of 1 positions shown · IV contrast (agent unspecified)
Comparison: none

INDICATION: Poor venous access. Request PICC line placement for prolonged IV
therapies.

EXAM:
ULTRASOUND AND FLUOROSCOPIC GUIDED PICC LINE INSERTION
MEDICATIONS:
1% plain lidocaine, 1 mL
CONTRAST:  None
FLUOROSCOPY TIME:  1 minutes, 12 seconds.
COMPLICATIONS:
None immediate.
TECHNIQUE: The procedure, risks, benefits, and alternatives were explained to
the patient and informed written consent was obtained. A timeout was
performed prior to the initiation of the procedure.

[Series 1: ir picc >5yo · 1 of 1 slices shown]
[im 1/1]
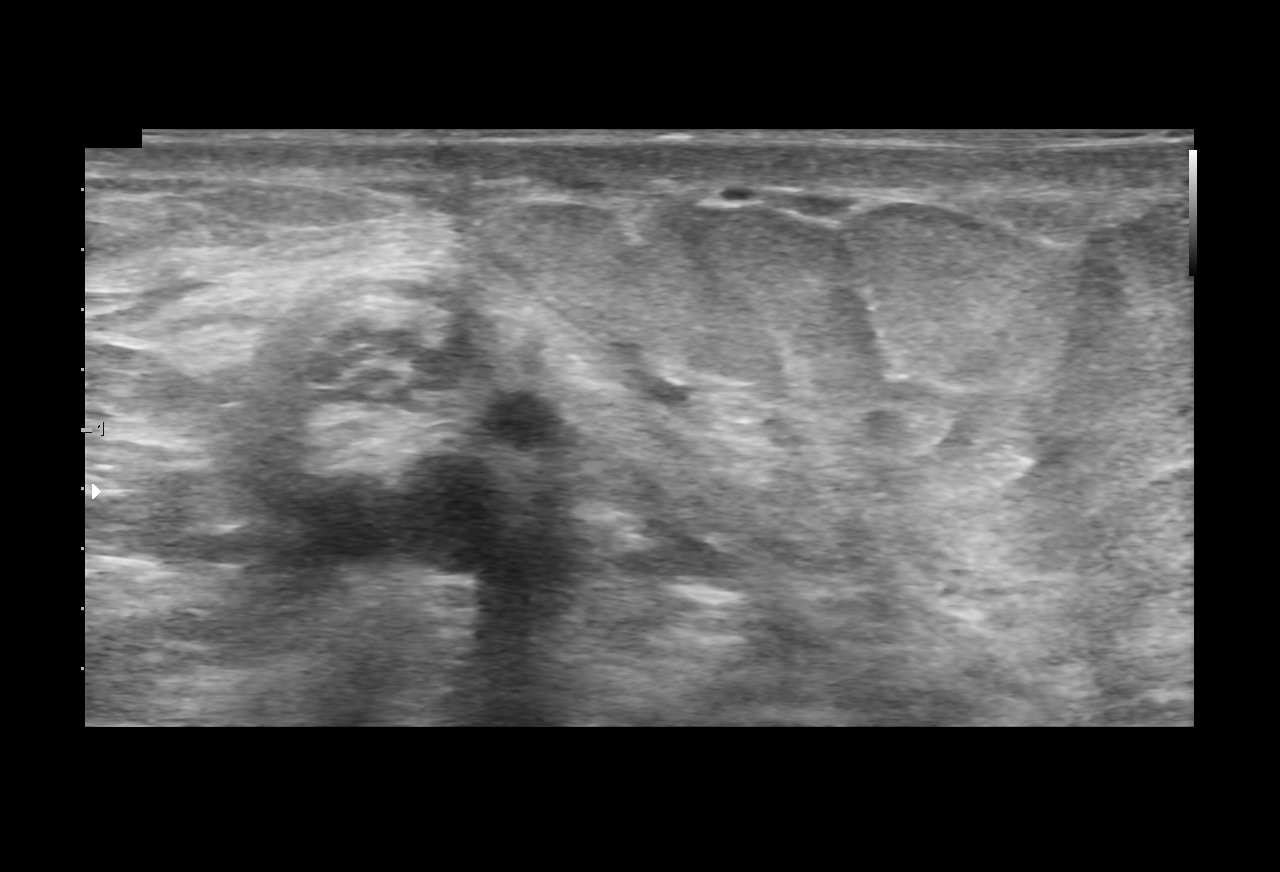

[1 of 1 positions shown; findings below may reference images not displayed]

The right upper extremity was prepped with chlorhexidine in a
sterile fashion, and a sterile drape was applied covering the
operative field. Maximum barrier sterile technique with sterile
gowns and gloves were used for the procedure. A timeout was
performed prior to the initiation of the procedure. Local anesthesia
was provided with 1% lidocaine.

Under direct ultrasound guidance, the brachial vein was accessed
with a micropuncture kit after the overlying soft tissues were
anesthetized with 1% lidocaine.

After the overlying soft tissues were anesthetized, a small venotomy
incision was created and a micropuncture kit was utilized to access
the right brachial vein. Real-time ultrasound guidance was utilized
for vascular access including the acquisition of a permanent
ultrasound image documenting patency of the accessed vessel.

A guidewire was advanced to the level of the superior caval-atrial
junction for measurement purposes and the PICC line was cut to
length. A peel-away sheath was placed and a 39 cm, 5 French, dual
lumen was inserted to level of the superior caval-atrial junction. A
post procedure spot fluoroscopic was obtained. The catheter easily
aspirated and flushed and was secured in place. A dressing was
placed. The patient tolerated the procedure well without immediate
post procedural complication.
FINDINGS: After catheter placement, the tip lies within the superior
cavoatrial junction. The catheter aspirates and flushes normally and
is ready for immediate use.
IMPRESSION: Successful ultrasound and fluoroscopic guided placement of a right
brachial vein approach, 39 cm, 5 French, dual lumen PICC with tip at
the superior caval-atrial junction. The PICC line is ready for
immediate use.

## 2022-02-14 IMAGING — DX DG CHEST 1V PORT
1 series · 1 of 1 positions shown · non-contrast
Comparison: Portable chest 06/18/2019 and earlier.

CLINICAL DATA: 53-year-old male with sepsis and hypotension. Line
placement.

EXAM:
PORTABLE CHEST 1 VIEW

[chest]
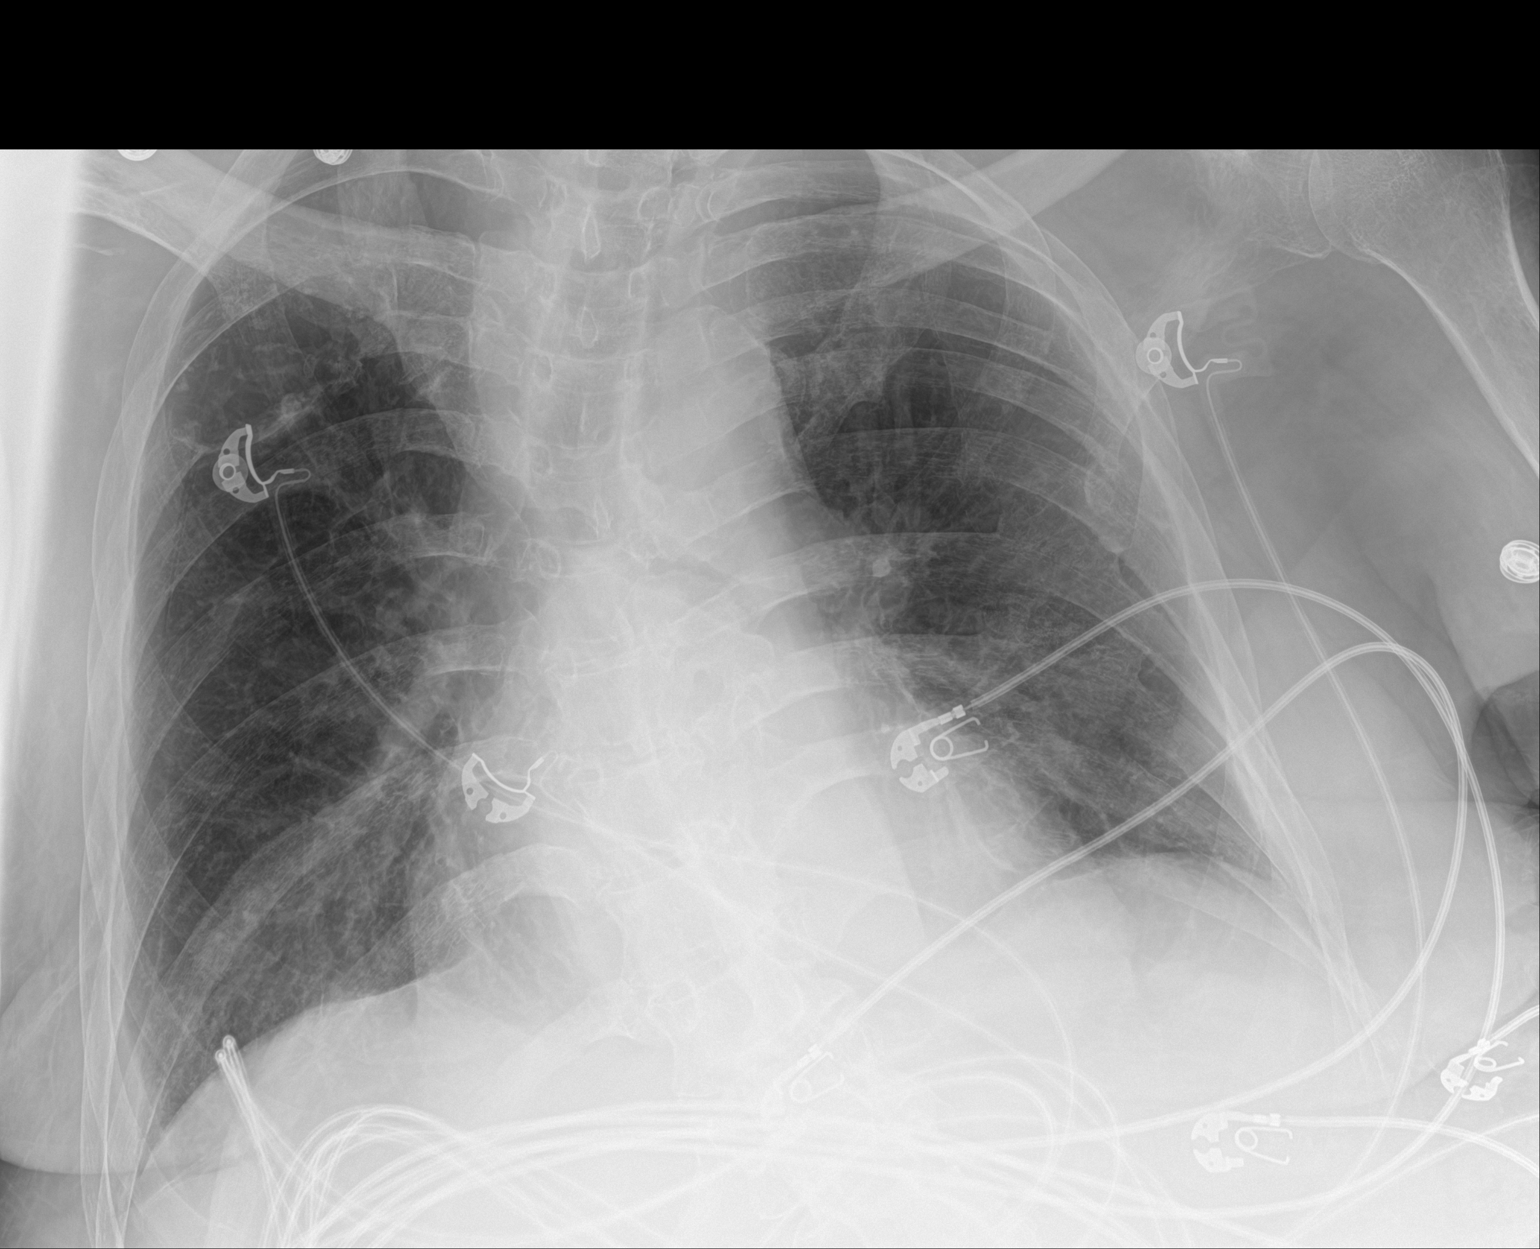

[1 of 1 positions shown; findings below may reference images not displayed]

FINDINGS: Portable AP semi upright view at 6313 hours. No central line is
identified.

Stable lung volumes and mediastinal contours. Visualized tracheal
air column is within normal limits. Allowing for portable technique
the lungs are clear. No acute osseous abnormality identified.
IMPRESSION: 1. No central line is identified.
2.  No acute cardiopulmonary abnormality.

## 2022-02-16 IMAGING — DX DG CHEST 1V PORT
1 series · 1 of 1 positions shown · non-contrast
Comparison: 07/03/2019 and older exams.  CT, 05/02/2019.

CLINICAL DATA: Sepsis.

EXAM:
PORTABLE CHEST 1 VIEW

[chest ap]
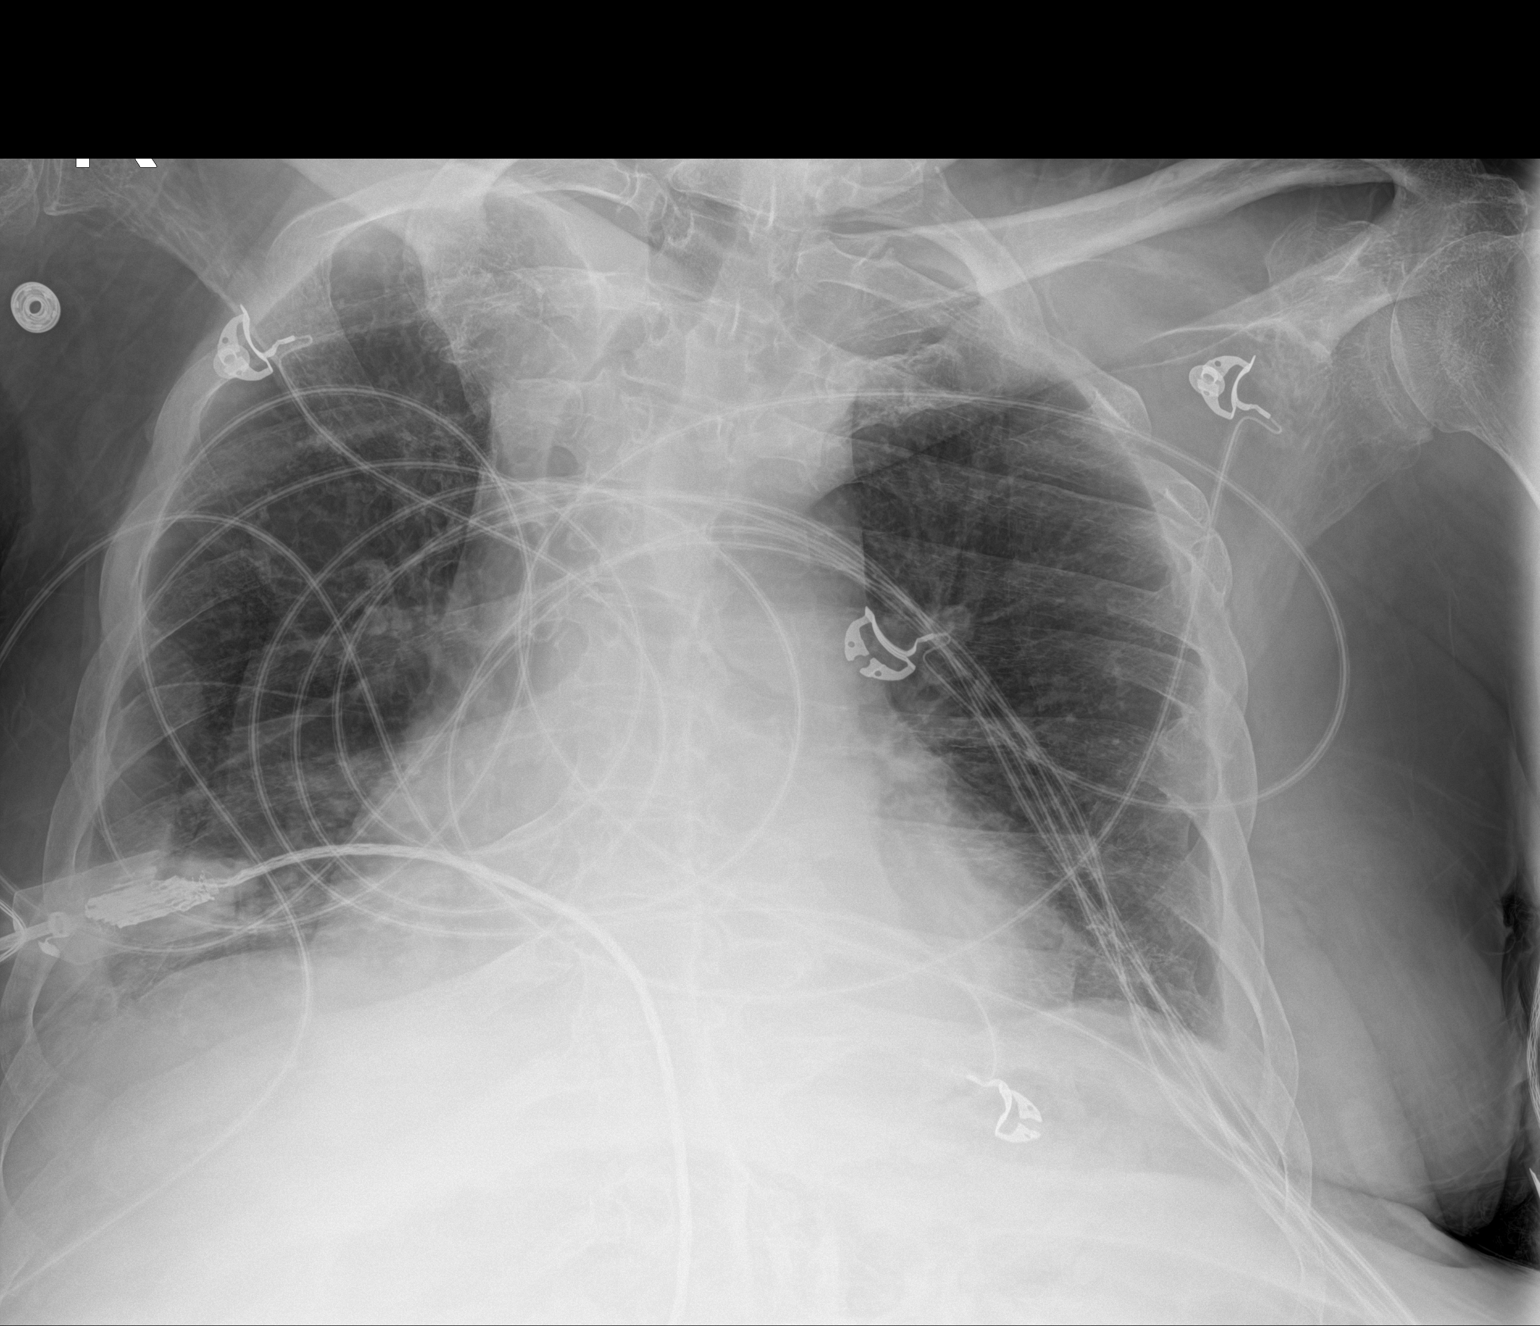

[1 of 1 positions shown; findings below may reference images not displayed]

FINDINGS: Cardiac silhouette normal in size and configuration. No mediastinal
or hilar masses.

Mild opacity at the lung bases likely due to small effusions with
atelectasis. Remainder of the lungs is clear.

No pneumothorax.

Old left-sided rib fractures, healed. No acute skeletal abnormality.
IMPRESSION: 1. Mild lung base opacity consistent with small effusions and
atelectasis.
2. No acute cardiopulmonary disease.

## 2022-02-20 IMAGING — US IR FLUORO GUIDE CV LINE*R*
1 series · 1 of 1 positions shown · non-contrast
Comparison: none

INDICATION: Poor intravenous access, sacral decubitus ulcers, osteomyelitis of
the sacral bone and chronic urinary tract infections.

[Series 1: ir fluoro guide cv line*right* · 1 of 1 slices shown]
[im 1/1]
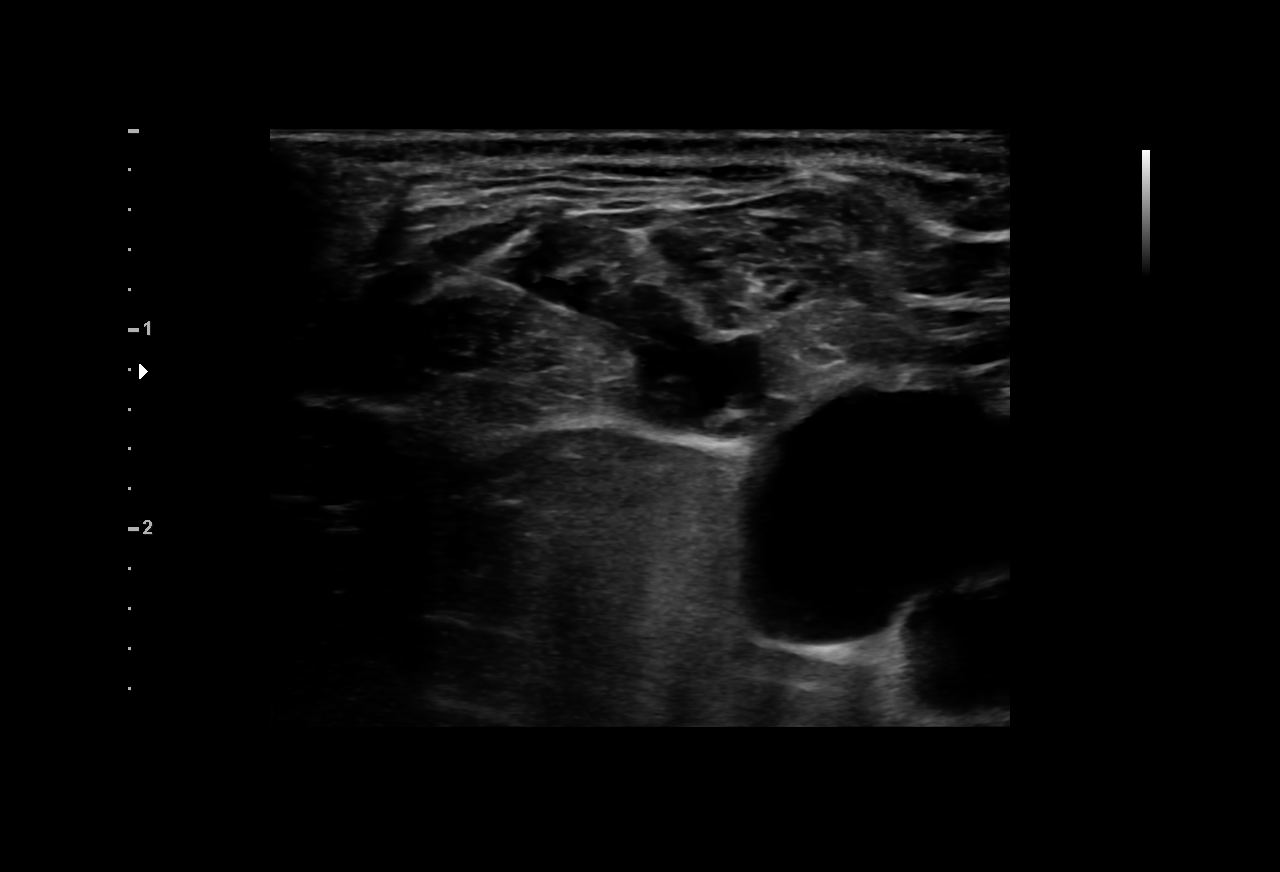

[1 of 1 positions shown; findings below may reference images not displayed]

EXAM:
TUNNELED CENTRAL VENOUS CATHETER PLACEMENT WITH ULTRASOUND AND
FLUOROSCOPIC GUIDANCE

MEDICATIONS:
None

ANESTHESIA/SEDATION:
None

FLUOROSCOPY TIME:  Fluoroscopy Time: 18 seconds.  0.7 mGy.

COMPLICATIONS:
None immediate.



Ultrasound was used to confirm patency of the right internal jugular
vein. After creating a small venotomy incision, a micropuncture kit
was utilized to access the right internal jugular vein under direct,
real-time ultrasound guidance after the overlying soft tissues were
anesthetized with 1% lidocaine. Ultrasound image documentation was
performed. The microwire was kinked to measure appropriate catheter
length. The micropuncture sheath was exchanged for a peel-away
sheath over a guidewire. A 6 French dual lumen tunneled power line
was tunneled in a retrograde fashion from the anterior chest wall to
the venotomy incision. The catheter was cut to appropriate length.

The catheter was then placed through the peel-away sheath with tip
ultimately positioned at the superior caval-atrial junction. Final
catheter positioning was confirmed and documented with a spot
radiographic image. The catheter aspirates and flushes normally.

The catheter exit site was secured with Prolene an Ethilon retention
sutures. The venotomy incision was closed with interrupted 4-0
Vicryl and Dermabond. Dressings were applied.
FINDINGS: The power line was cut to 23 cm. After catheter placement, the tip
lies within the SVC/RA junction. The catheter aspirates and flushes
normally and is ready for immediate use.
IMPRESSION: Successful placement of 23cm dual lumen tunneled central venous
catheter via the right internal jugular vein with tip terminating at
the SVC/RA junction. The catheter is ready for immediate use.

## 2022-02-20 IMAGING — XA IR US GUIDE VASC ACCESS RIGHT
1 series · 1 of 1 positions shown · non-contrast
Comparison: none

INDICATION: Poor intravenous access, sacral decubitus ulcers, osteomyelitis of
the sacral bone and chronic urinary tract infections.

[Series 2: fl (-) angio · 1 of 1 slices shown]
[im 1/1]
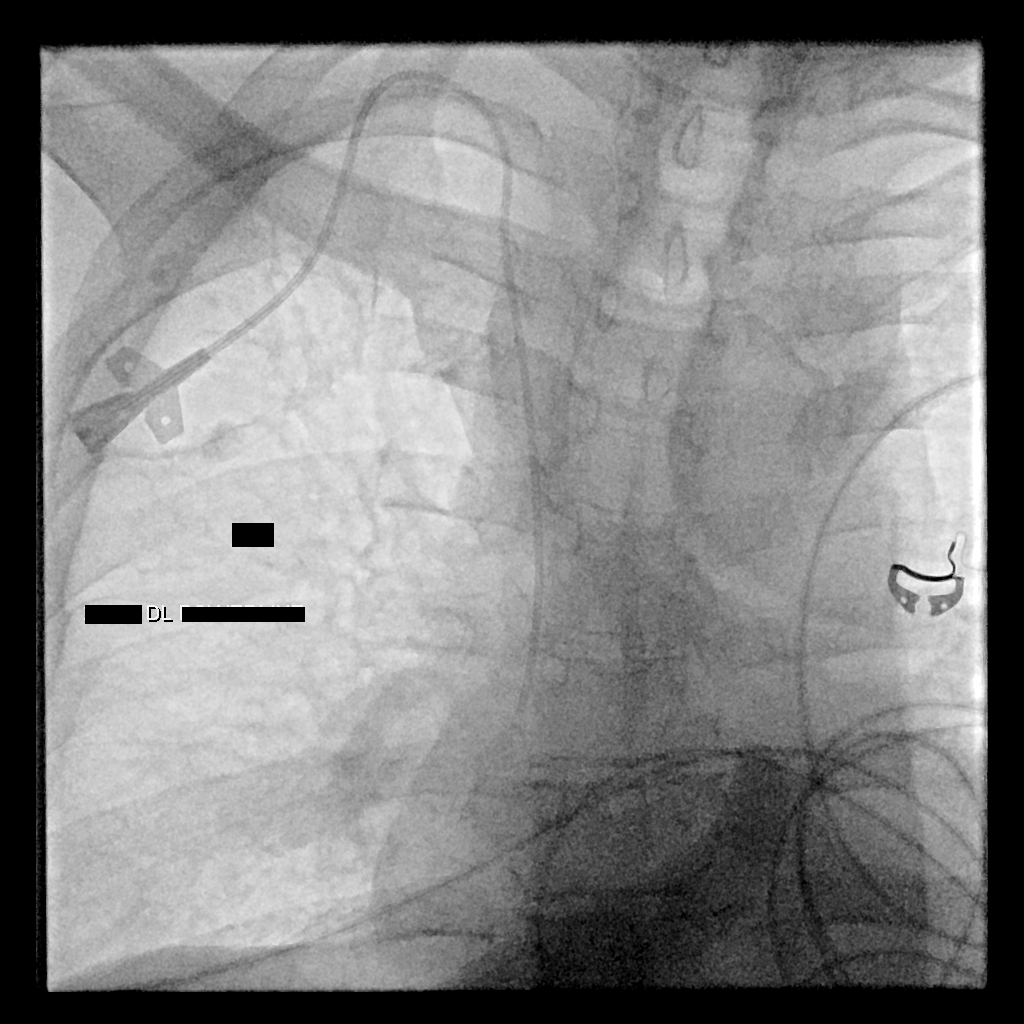

[1 of 1 positions shown; findings below may reference images not displayed]

EXAM:
TUNNELED CENTRAL VENOUS CATHETER PLACEMENT WITH ULTRASOUND AND
FLUOROSCOPIC GUIDANCE

MEDICATIONS:
None

ANESTHESIA/SEDATION:
None

FLUOROSCOPY TIME:  Fluoroscopy Time: 18 seconds.  0.7 mGy.

COMPLICATIONS:
None immediate.



Ultrasound was used to confirm patency of the right internal jugular
vein. After creating a small venotomy incision, a micropuncture kit
was utilized to access the right internal jugular vein under direct,
real-time ultrasound guidance after the overlying soft tissues were
anesthetized with 1% lidocaine. Ultrasound image documentation was
performed. The microwire was kinked to measure appropriate catheter
length. The micropuncture sheath was exchanged for a peel-away
sheath over a guidewire. A 6 French dual lumen tunneled power line
was tunneled in a retrograde fashion from the anterior chest wall to
the venotomy incision. The catheter was cut to appropriate length.

The catheter was then placed through the peel-away sheath with tip
ultimately positioned at the superior caval-atrial junction. Final
catheter positioning was confirmed and documented with a spot
radiographic image. The catheter aspirates and flushes normally.

The catheter exit site was secured with Prolene an Ethilon retention
sutures. The venotomy incision was closed with interrupted 4-0
Vicryl and Dermabond. Dressings were applied.
FINDINGS: The power line was cut to 23 cm. After catheter placement, the tip
lies within the SVC/RA junction. The catheter aspirates and flushes
normally and is ready for immediate use.
IMPRESSION: Successful placement of 23cm dual lumen tunneled central venous
catheter via the right internal jugular vein with tip terminating at
the SVC/RA junction. The catheter is ready for immediate use.

## 2022-02-28 IMAGING — DX DG CHEST 1V PORT
1 series · 1 of 1 positions shown · non-contrast
Comparison: 05/03/2020

CLINICAL DATA: Cough

EXAM:
PORTABLE CHEST 1 VIEW

[chest ap]
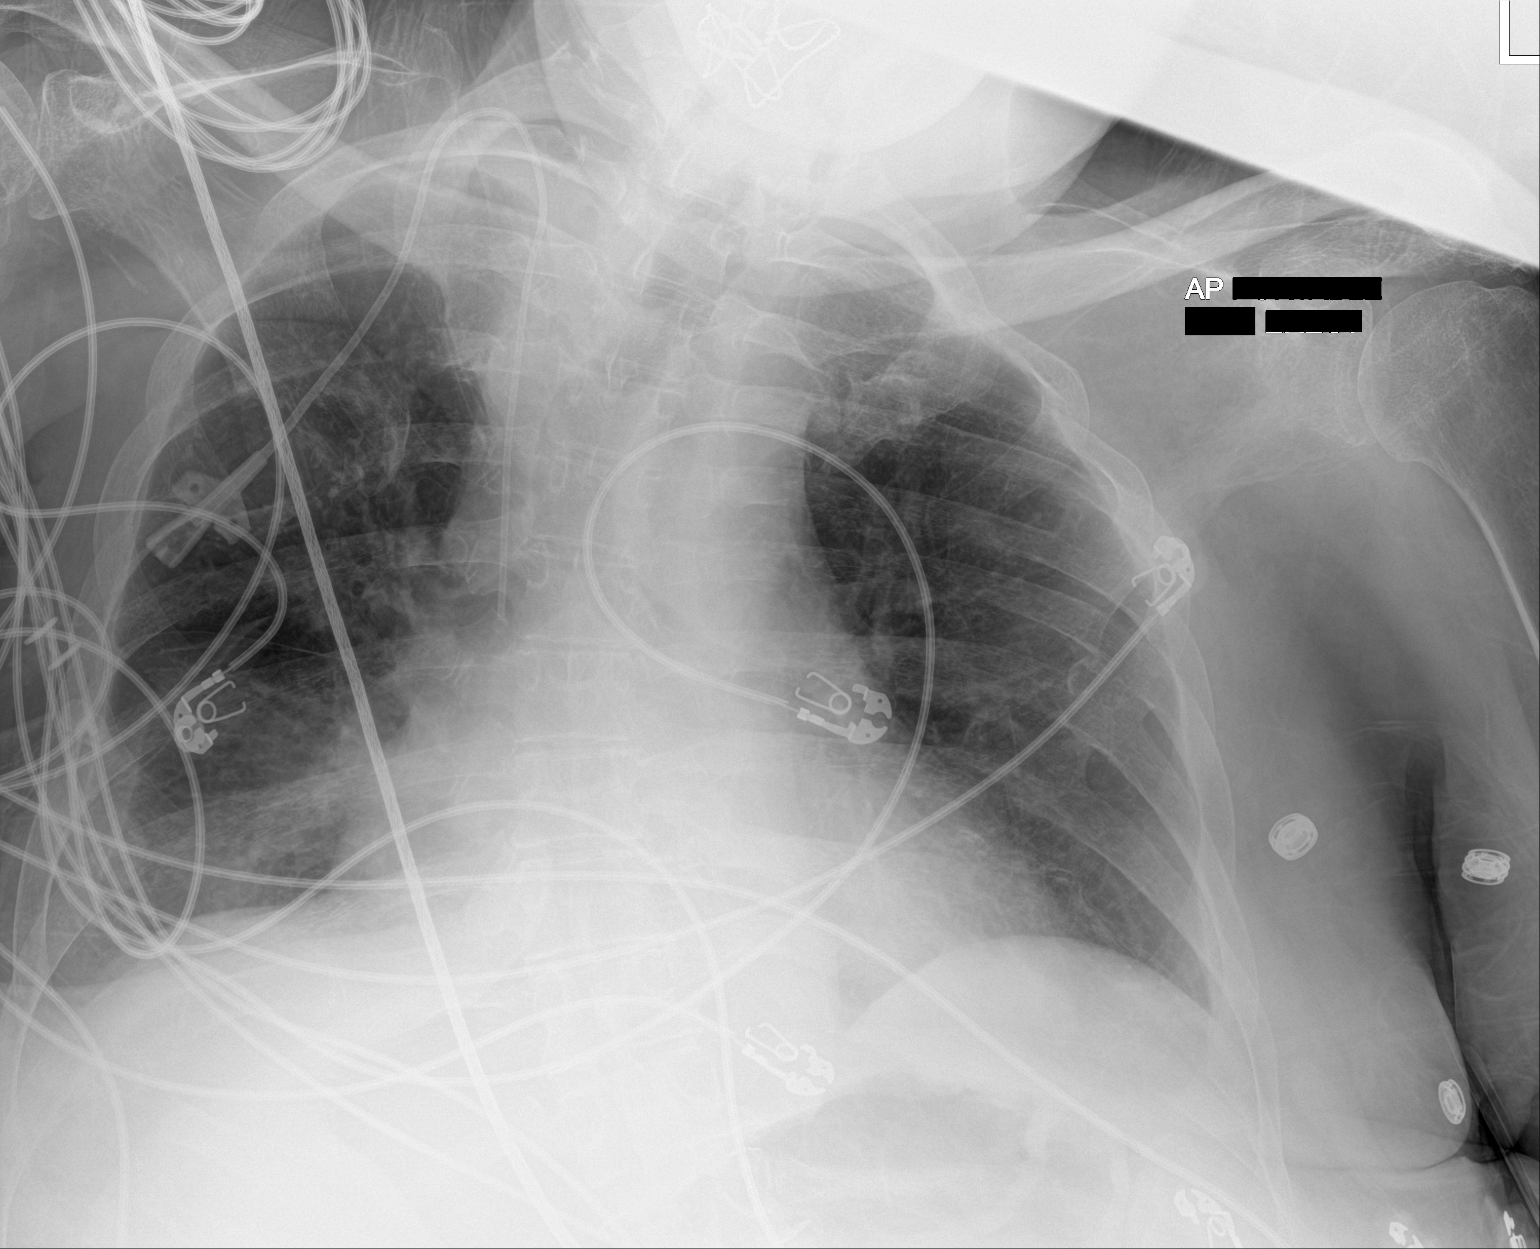

[1 of 1 positions shown; findings below may reference images not displayed]

FINDINGS: There is a well-positioned right-sided tunneled catheter. The tip
terminates near the cavoatrial junction. Bibasilar airspace disease
is noted, right worse than left. There are probable small bilateral
pleural effusions. No pneumothorax. The heart size is stable. No
acute osseous abnormality. There are old healed left-sided rib
fractures.
IMPRESSION: 1. Well-positioned central venous catheter without evidence for
pneumothorax.
2. Small bilateral pleural effusions.
3. Bibasilar airspace opacities favored to represent atelectasis
with an infiltrate not entirely excluded.

## 2022-03-02 IMAGING — CT CT HEAD W/O CM
3 of 6 series · 16 of 47 positions shown, 19 images · non-contrast
Comparison: None.

CLINICAL DATA: Sudden onset of altered mental status. Infection
suspected.

EXAM:
CT HEAD WITHOUT CONTRAST
TECHNIQUE: Contiguous axial images were obtained from the base of the skull
through the vertex without intravenous contrast.

[Series 2: head wo · axial · 0.47mm/px · z∈[-42,+98]mm · 11 of 34 slices shown, 14 images]
[im 3/34  brain]
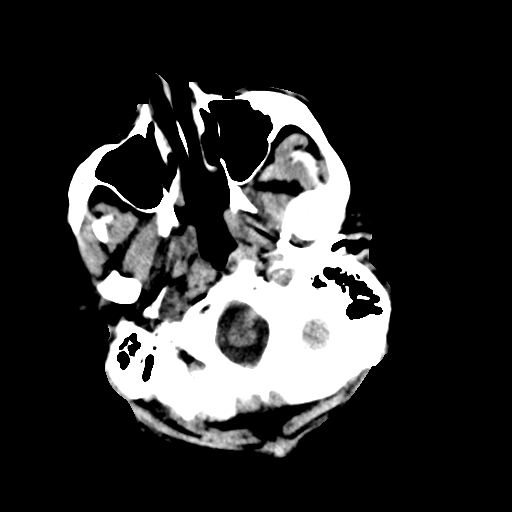
[im 3/34  bone]
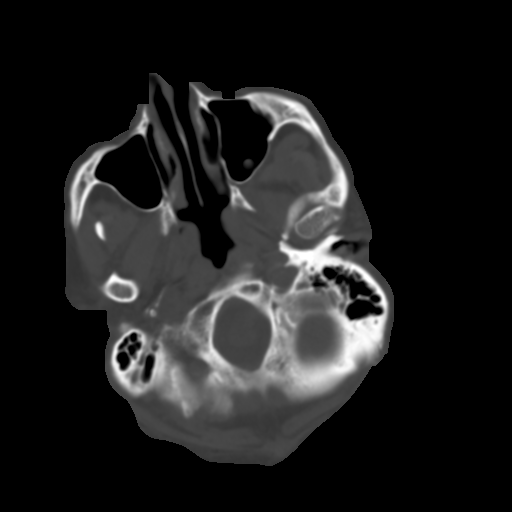
[im 5/34  brain]
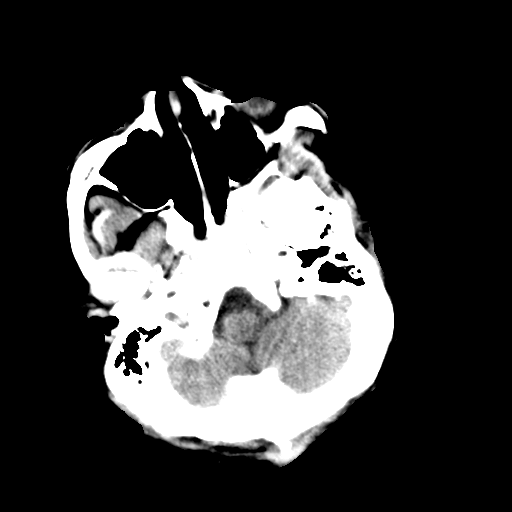
[im 8/34  brain]
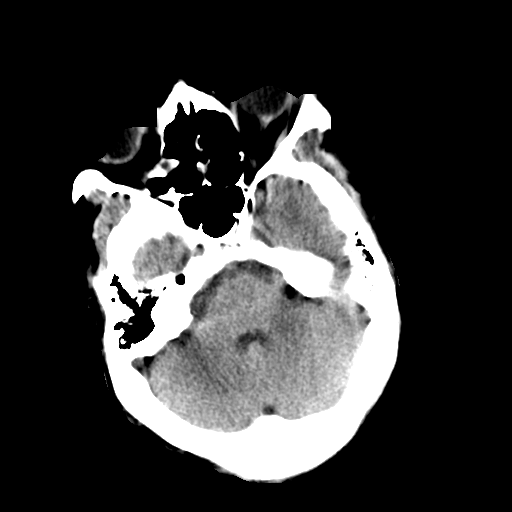
[im 12/34  brain]
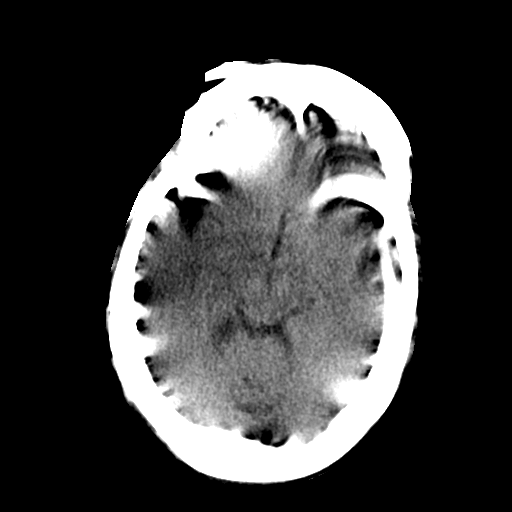
[im 15/34  brain]
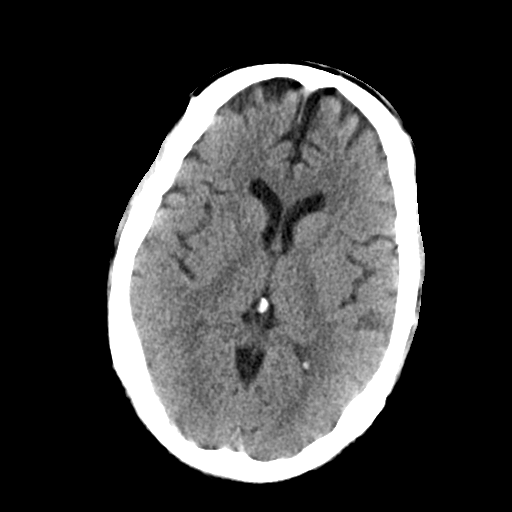
[im 15/34  bone]
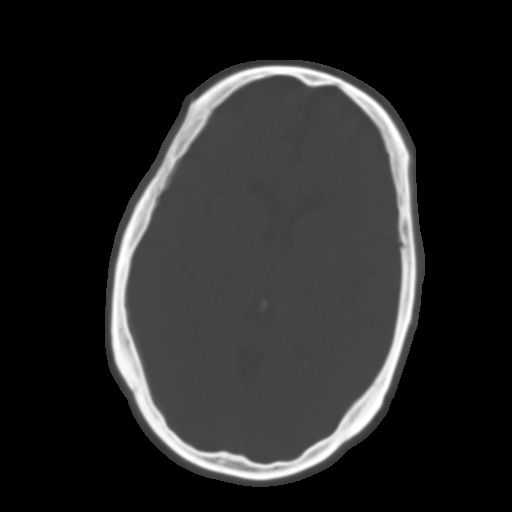
[im 17/34  brain]
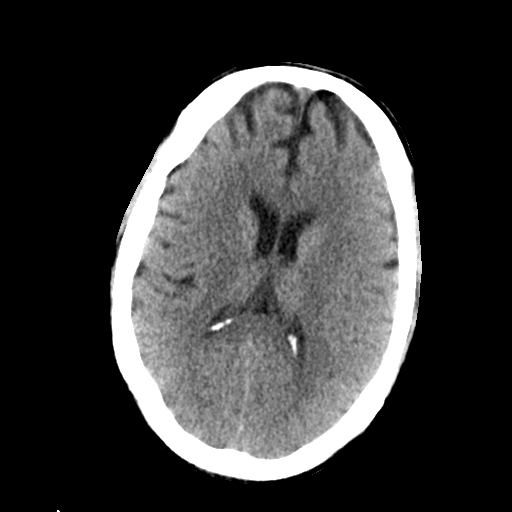
[im 19/34  brain]
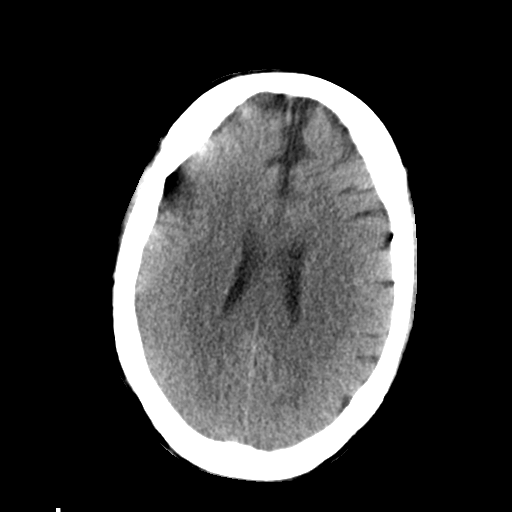
[im 22/34  brain]
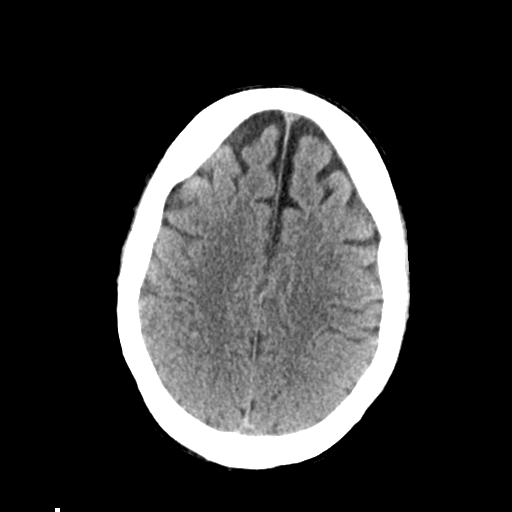
[im 26/34  brain]
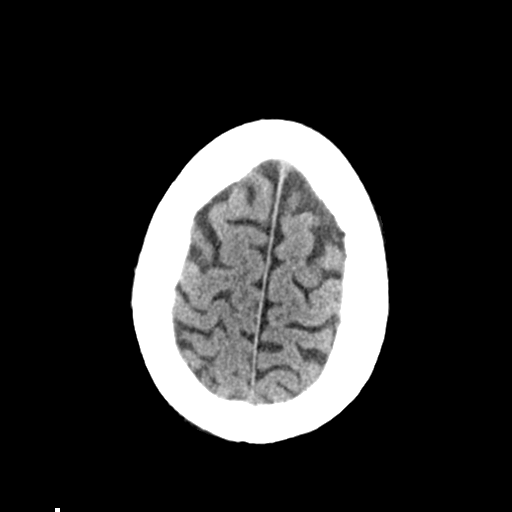
[im 26/34  bone]
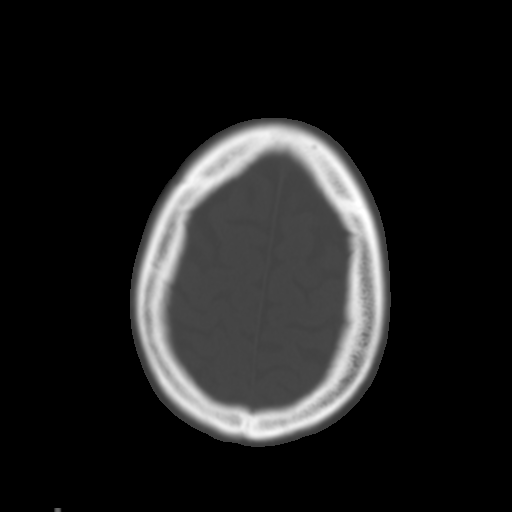
[im 29/34  brain]
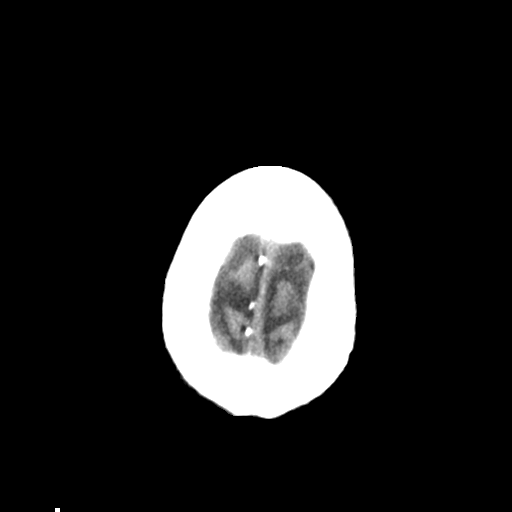
[im 31/34  brain]
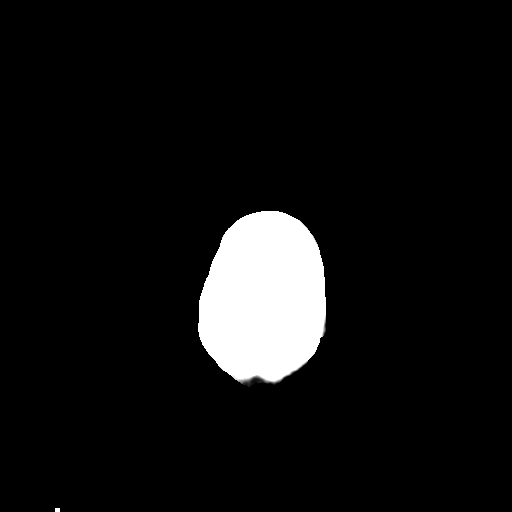

[Series 4: coronal soft tissue · coronal · 0.33mm/px · 3 of 75 slices shown]
[im 19/75  brain]
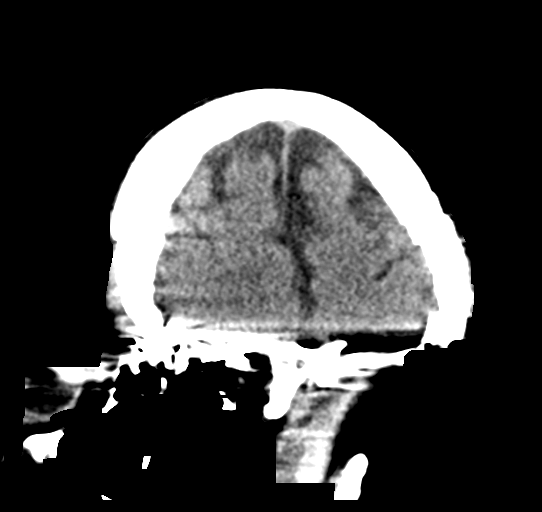
[im 38/75  brain]
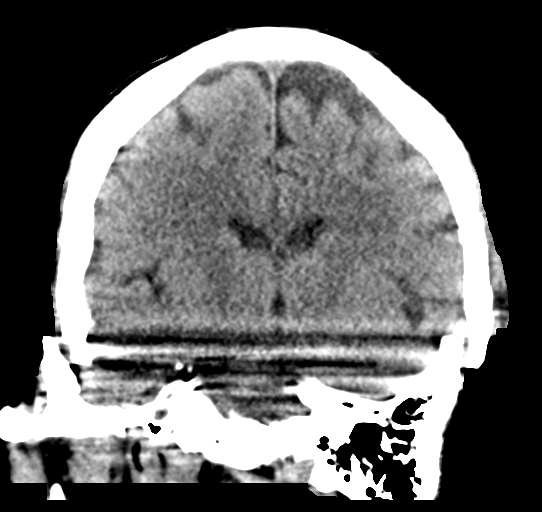
[im 56/75  brain]
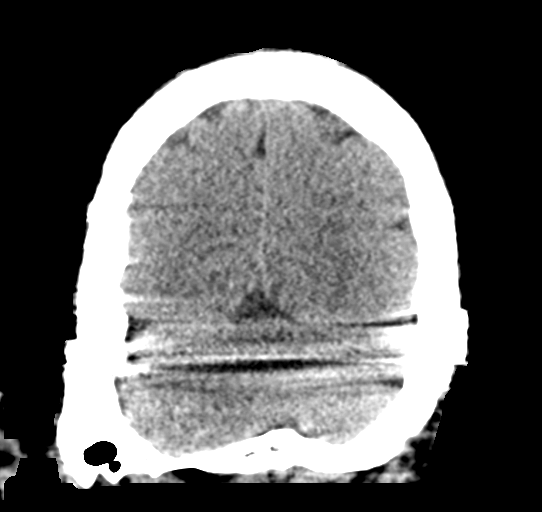

[Series 9: sagittal soft tissue · sagittal · 0.38mm/px · 2 of 61 slices shown]
[im 21/61  brain]
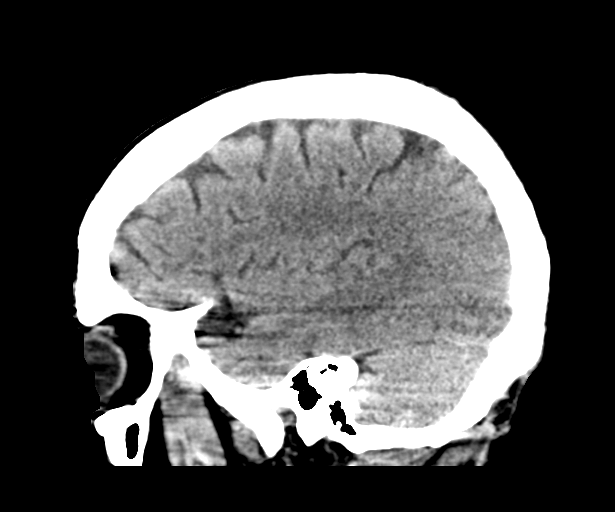
[im 41/61  brain]
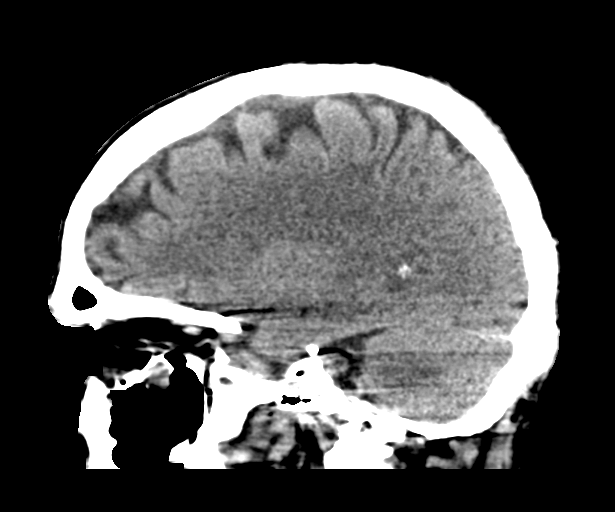

[16 of 47 positions shown; findings below may reference images not displayed]

FINDINGS: Brain: Study suffers from some motion degradation. No evidence of
accelerated atrophy. No evidence of old or acute infarction, mass
lesion, hemorrhage, hydrocephalus or extra-axial collection.

Vascular: No abnormal vascular finding.

Skull: Normal

Sinuses/Orbits: No inflammatory sinus disease. Few small retention
cysts. Mastoids are clear. Orbits negative.

Other: None
IMPRESSION: Motion degraded but otherwise normal exam. No abnormality seen to
explain altered mental status.

## 2022-03-04 IMAGING — DX DG CHEST 1V PORT
1 series · 1 of 1 positions shown · non-contrast
Comparison: Chest radiograph dated 07/17/2019.

CLINICAL DATA: 53-year-old male status post intubation.

EXAM:
PORTABLE CHEST 1 VIEW

[chest ap]
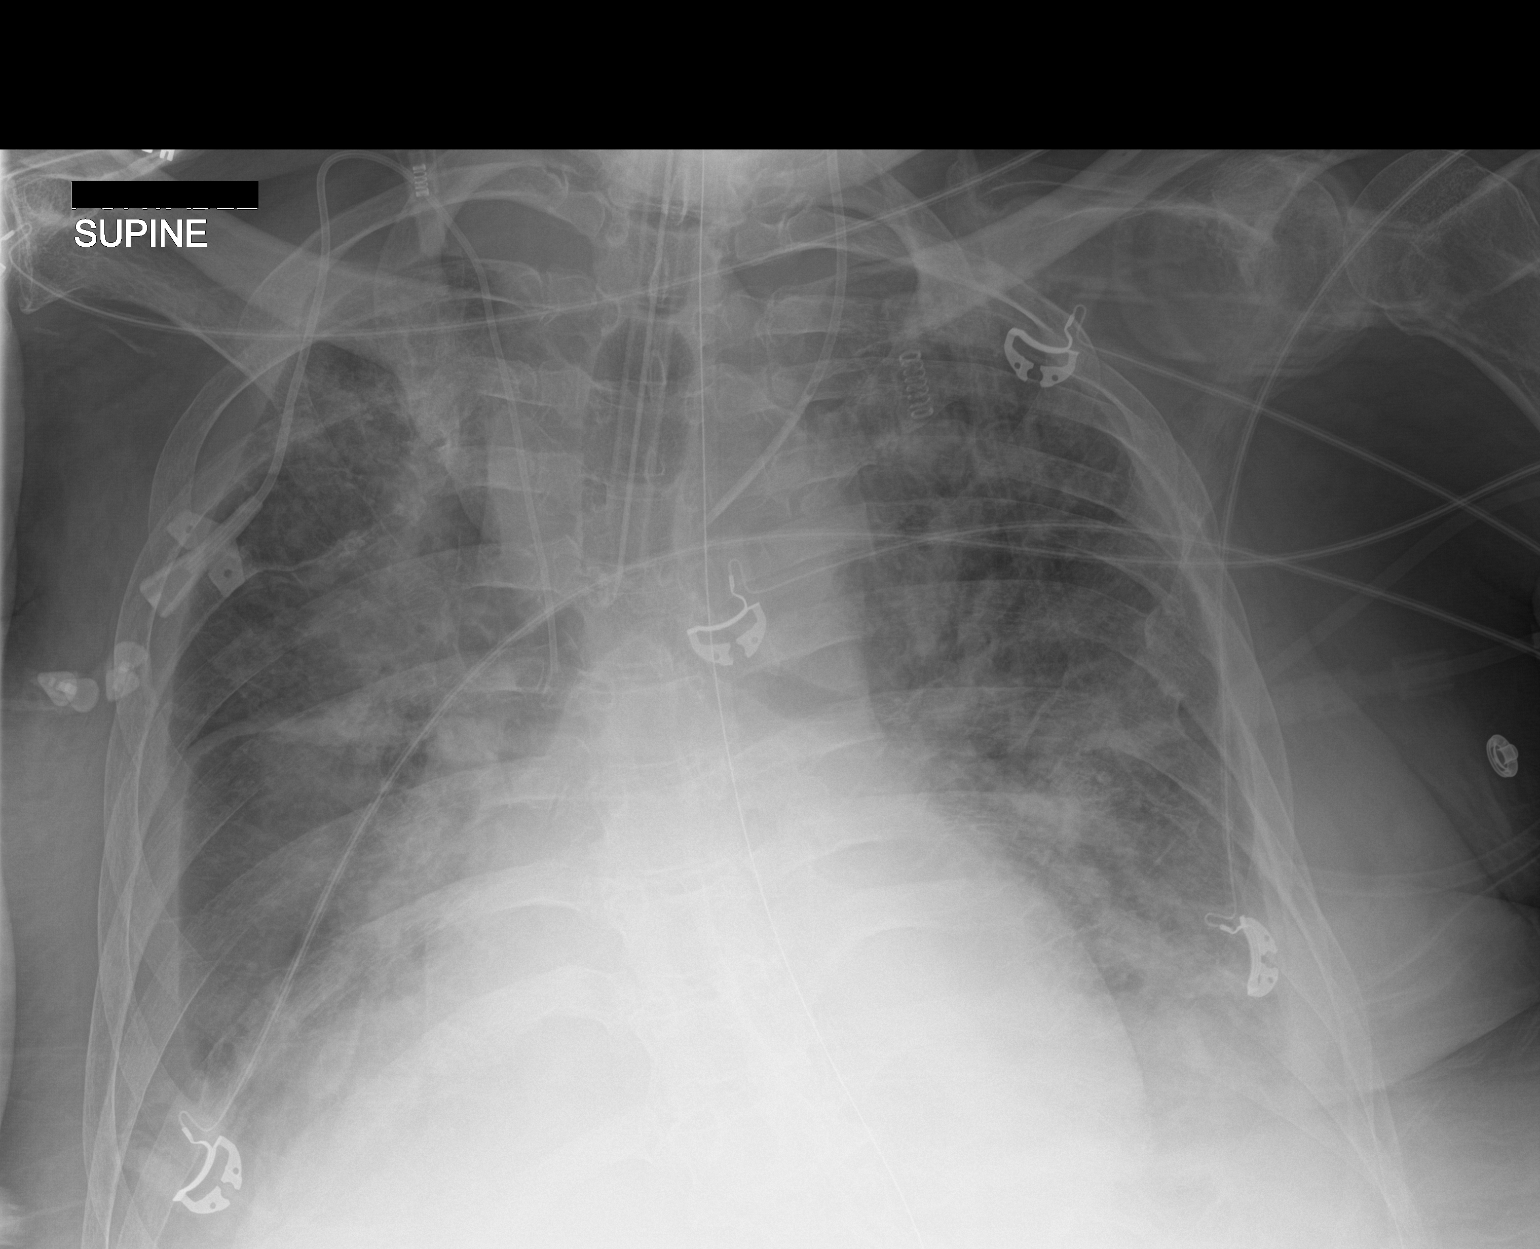

[1 of 1 positions shown; findings below may reference images not displayed]

FINDINGS: Endotracheal tube with tip at the level of the carina tilting
towards the right mainstem bronchus. Recommend retraction by
approximately 4 cm. Right IJ central venous line with tip over
central SVC. Left IJ central venous line with tip in the left
innominate vein. Enteric tube extends below the diaphragm with tip
beyond the inferior margin of the image.

There is cardiomegaly with vascular congestion and edema. Small to
moderate bilateral pleural effusions noted. Pneumonia is not
excluded. Clinical correlation is recommended. No pneumothorax. No
acute osseous pathology.
IMPRESSION: 1. Endotracheal tube at the level of the carina tilting towards the
right mainstem bronchus. Recommend retraction by approximately 4 cm.
2. Cardiomegaly with findings of CHF and bilateral pleural
effusions. Pneumonia is not excluded.

These results were called by telephone at the time of interpretation
on 07/21/2019 at [DATE] to provider SIMIAO HABUNGAN , who verbally
acknowledged these results.

## 2022-03-04 IMAGING — DX DG CHEST 1V PORT
1 series · 1 of 1 positions shown · non-contrast
Comparison: Prior today

CLINICAL DATA: Acute respiratory failure. Endotracheally intubated.
Fever and tachycardia. Quadriplegia.

EXAM:
PORTABLE CHEST 1 VIEW

[chest ap]
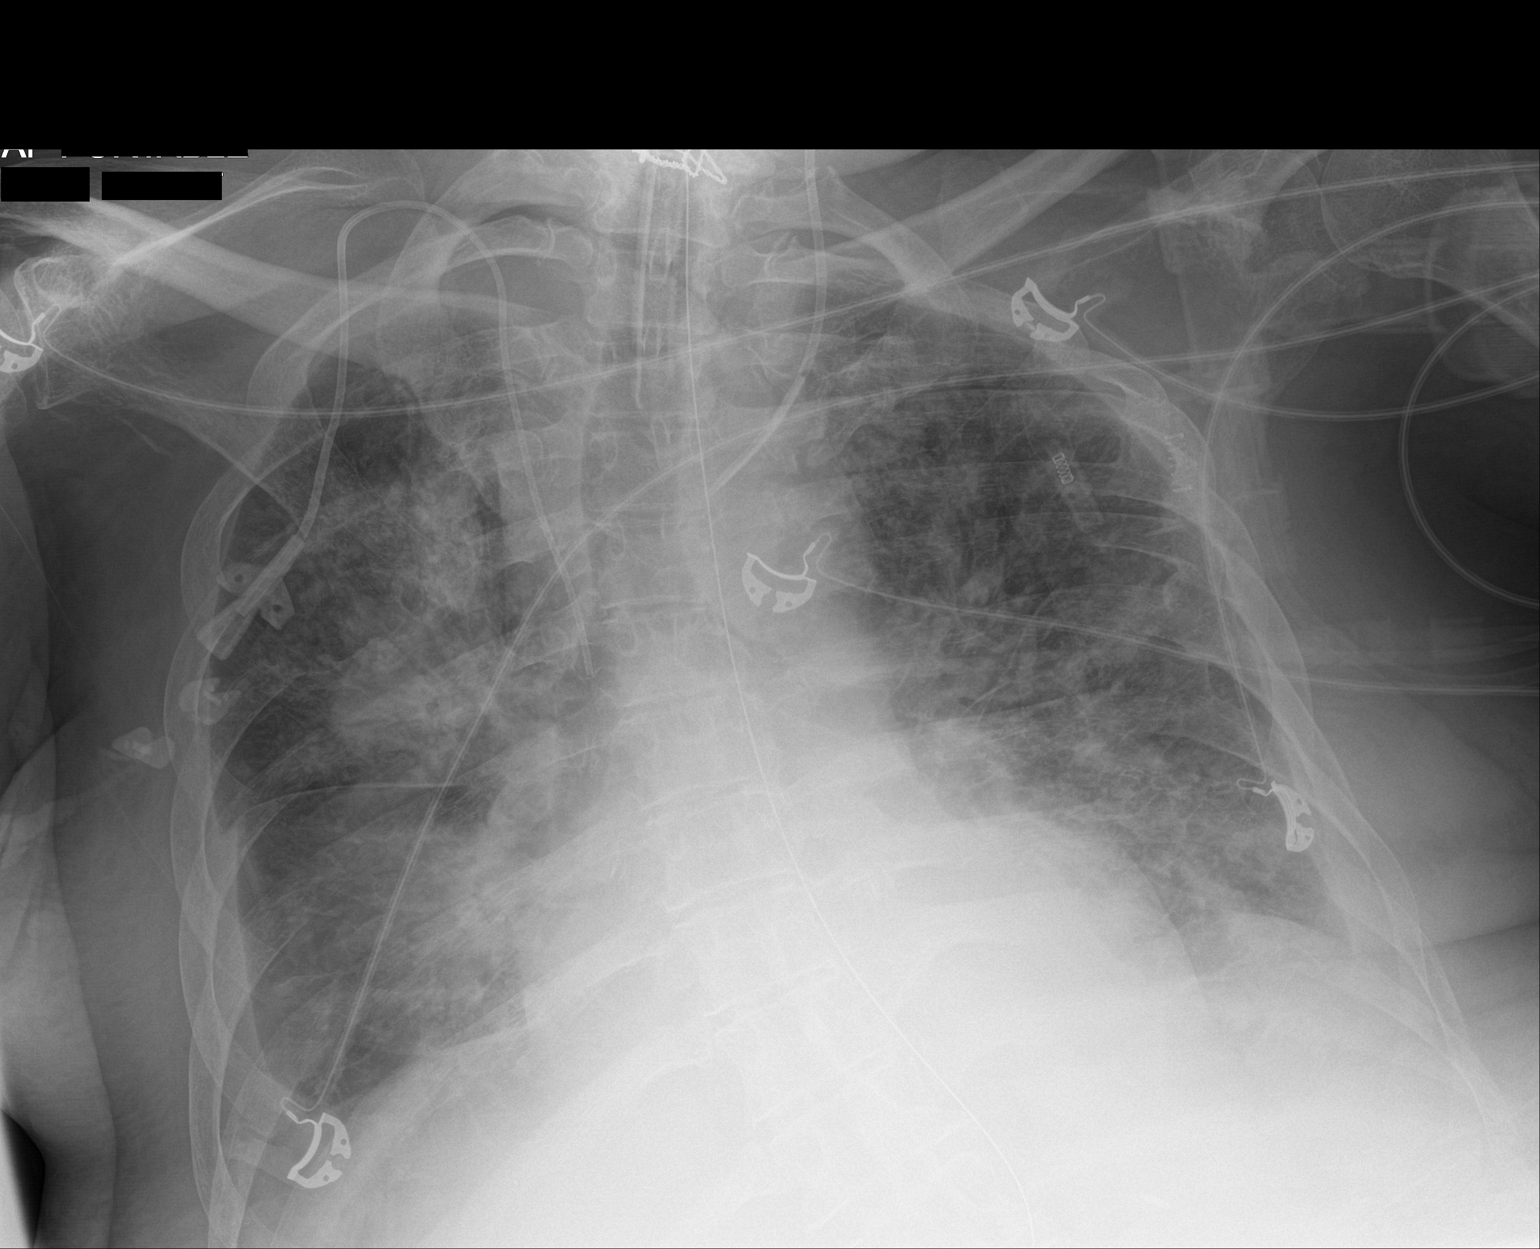

[1 of 1 positions shown; findings below may reference images not displayed]

FINDINGS: The endotracheal tube tube has been pulled back, with tip now 6.5 cm
above the carina. Other support lines and tubes remain in stable
position. No pneumothorax visualized.

Cardiomegaly, diffuse bilateral pulmonary airspace disease, and
small layering bilateral pleural effusions show no significant
change.
IMPRESSION: 1. Endotracheal tube tip now 6.5 cm above the carina.
2. Stable cardiomegaly, diffuse bilateral pulmonary airspace
disease, and small layering bilateral pleural effusions.

## 2022-03-08 IMAGING — DX DG CHEST 1V PORT
1 series · 1 of 1 positions shown · non-contrast
Comparison: Chest x-ray 07/24/2019

CLINICAL DATA: Respiratory failure.

EXAM:
PORTABLE CHEST 1 VIEW

[chest ap]
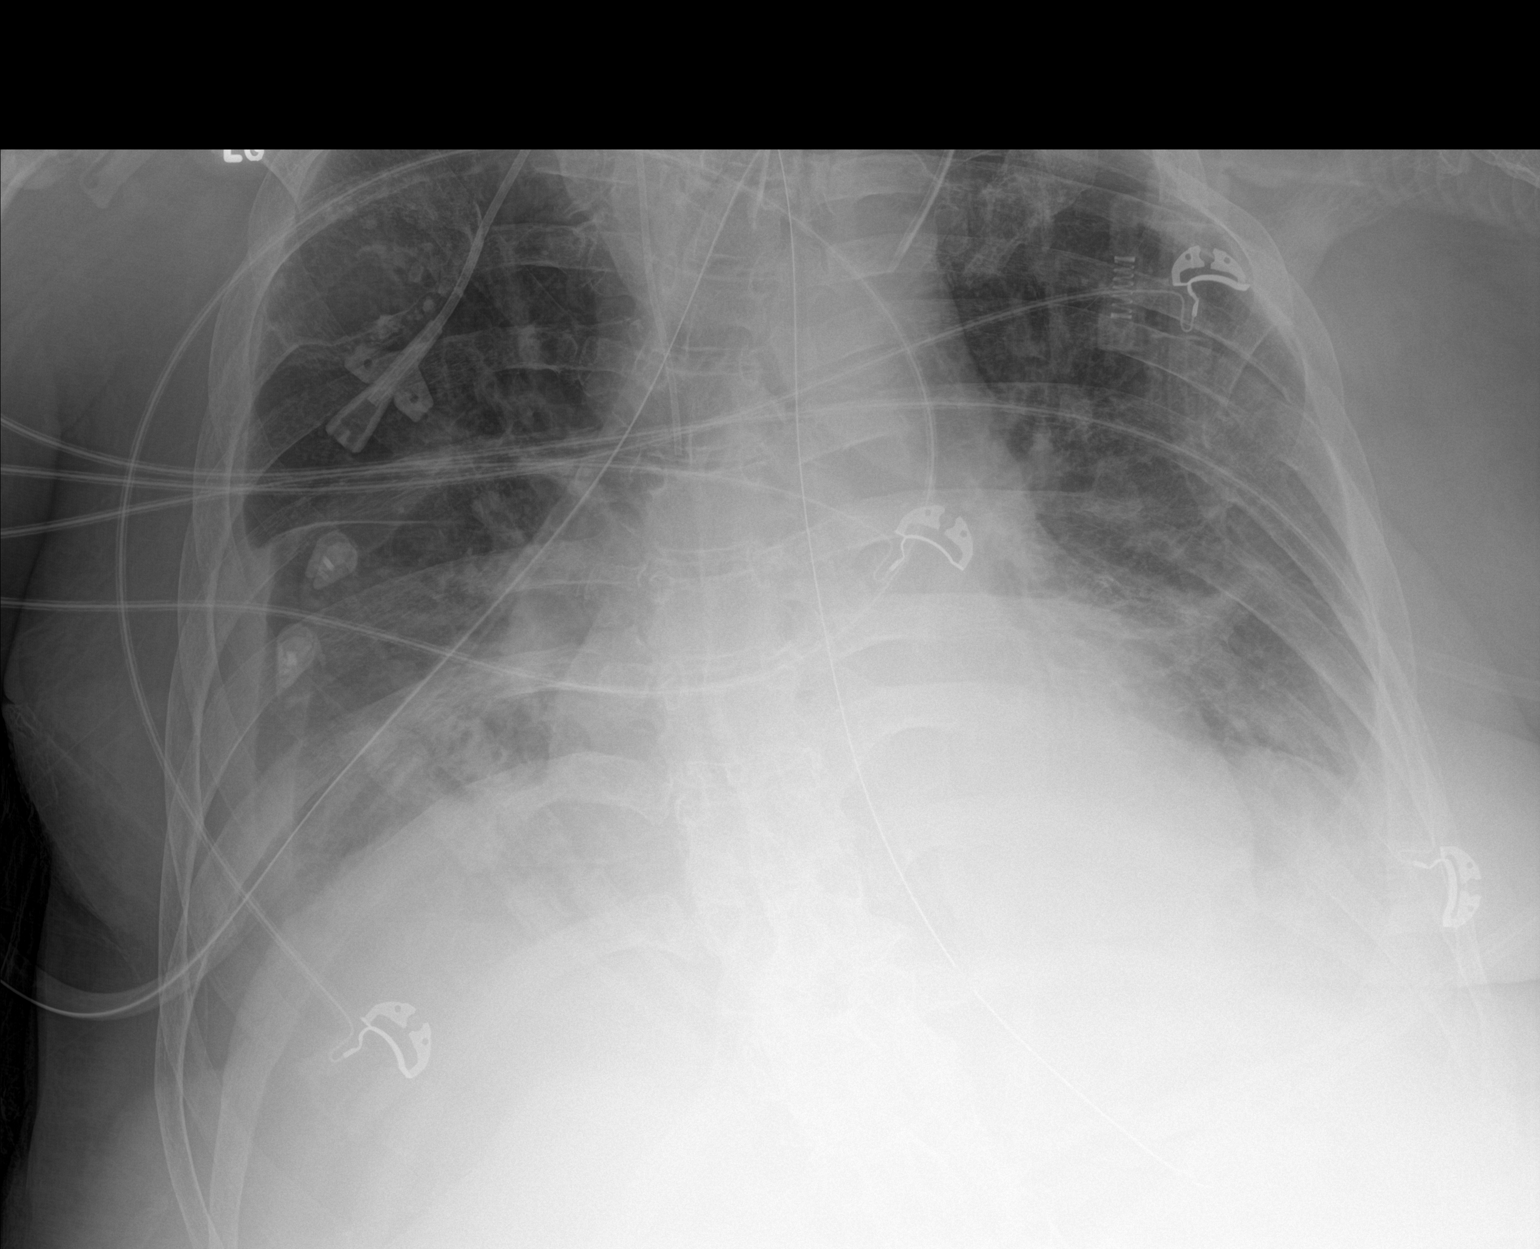

[1 of 1 positions shown; findings below may reference images not displayed]

FINDINGS: The endotracheal tube, NG tube and bilateral central venous
catheters are stable.

Persistent interstitial and airspace process in the lungs with
bilateral pleural effusions.
IMPRESSION: 1. Stable support apparatus.
2. Persistent interstitial and airspace process and pleural
effusions.

## 2022-03-10 IMAGING — DX DG CHEST 1V PORT
1 series · 1 of 1 positions shown · non-contrast
Comparison: 07/25/2019

CLINICAL DATA: Respiratory failure

EXAM:
PORTABLE CHEST 1 VIEW

[chest ap]
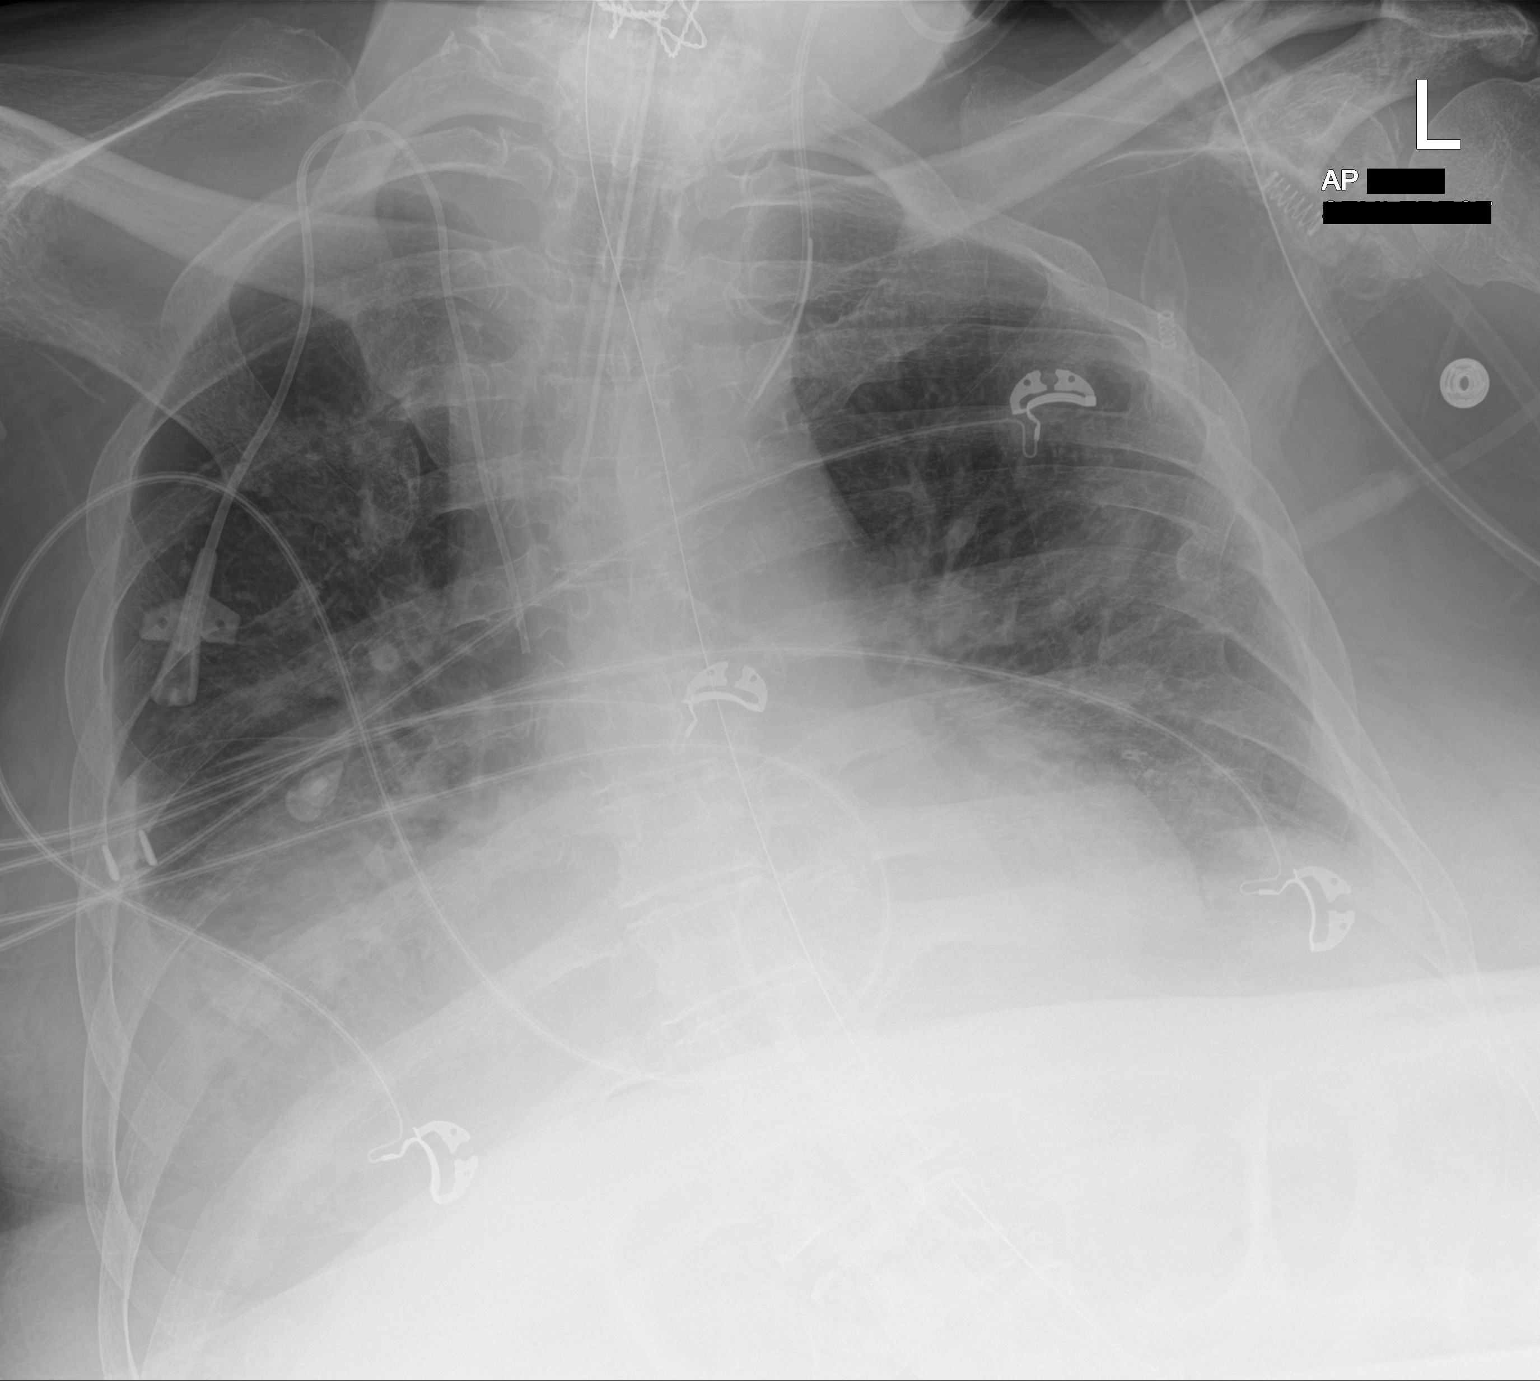

[1 of 1 positions shown; findings below may reference images not displayed]

FINDINGS: Slight interval advancement of endotracheal tube, now approximately
3.5 cm above the carina, in satisfactory position. Other support
apparatus remain unchanged. Unchanged layering bilateral pleural
effusions. No new airspace opacity.
IMPRESSION: 1. Slight interval advancement of endotracheal tube, which remains
in satisfactory position.

2. Otherwise unchanged examination with layering bilateral pleural
effusions. No new airspace opacity.

## 2022-03-11 IMAGING — DX DG CHEST 1V PORT
1 series · 1 of 1 positions shown · non-contrast
Comparison: 07/27/2019

CLINICAL DATA: Respiratory failure

EXAM:
PORTABLE CHEST 1 VIEW

[chest ap]
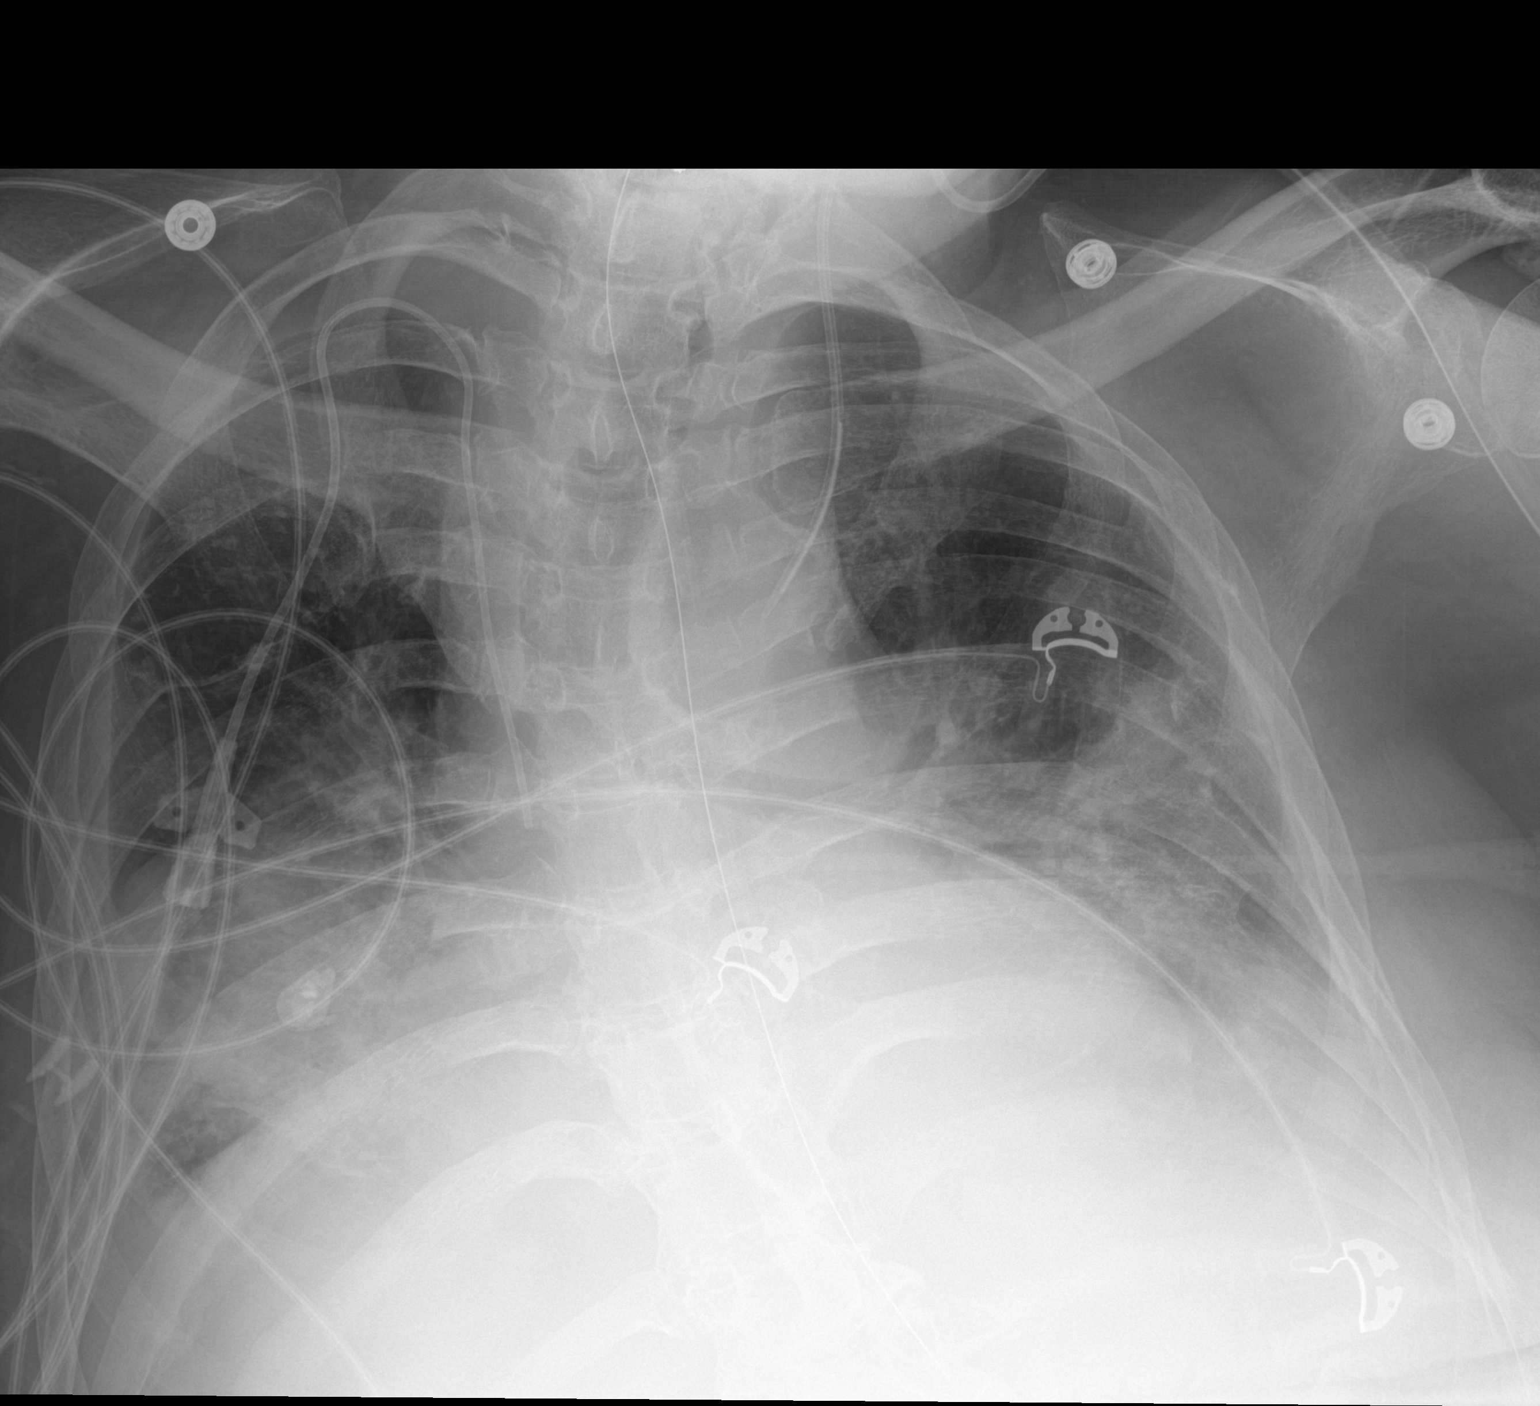

[1 of 1 positions shown; findings below may reference images not displayed]

FINDINGS: Interval extubation.

Left IJ venous catheter terminates in the left brachycephalic vein.
Right IJ venous catheter terminates at the cavoatrial junction.
Enteric tube courses into the proximal stomach.

Patchy bilateral pulmonary opacities, lower lobe predominant.
Suspected small bilateral pleural effusions. No pneumothorax.

The heart is normal in size.
IMPRESSION: Interval extubation.  Additional stable support apparatus as above.

Patchy bilateral lower lobe opacities with stable small bilateral
pleural effusions.

## 2022-03-13 IMAGING — DX DG CHEST 1V PORT
1 series · 1 of 1 positions shown · non-contrast
Comparison: Radiograph 07/28/2019

CLINICAL DATA: Ventilated patient, ETT position

EXAM:
PORTABLE CHEST 1 VIEW

[chest ap]
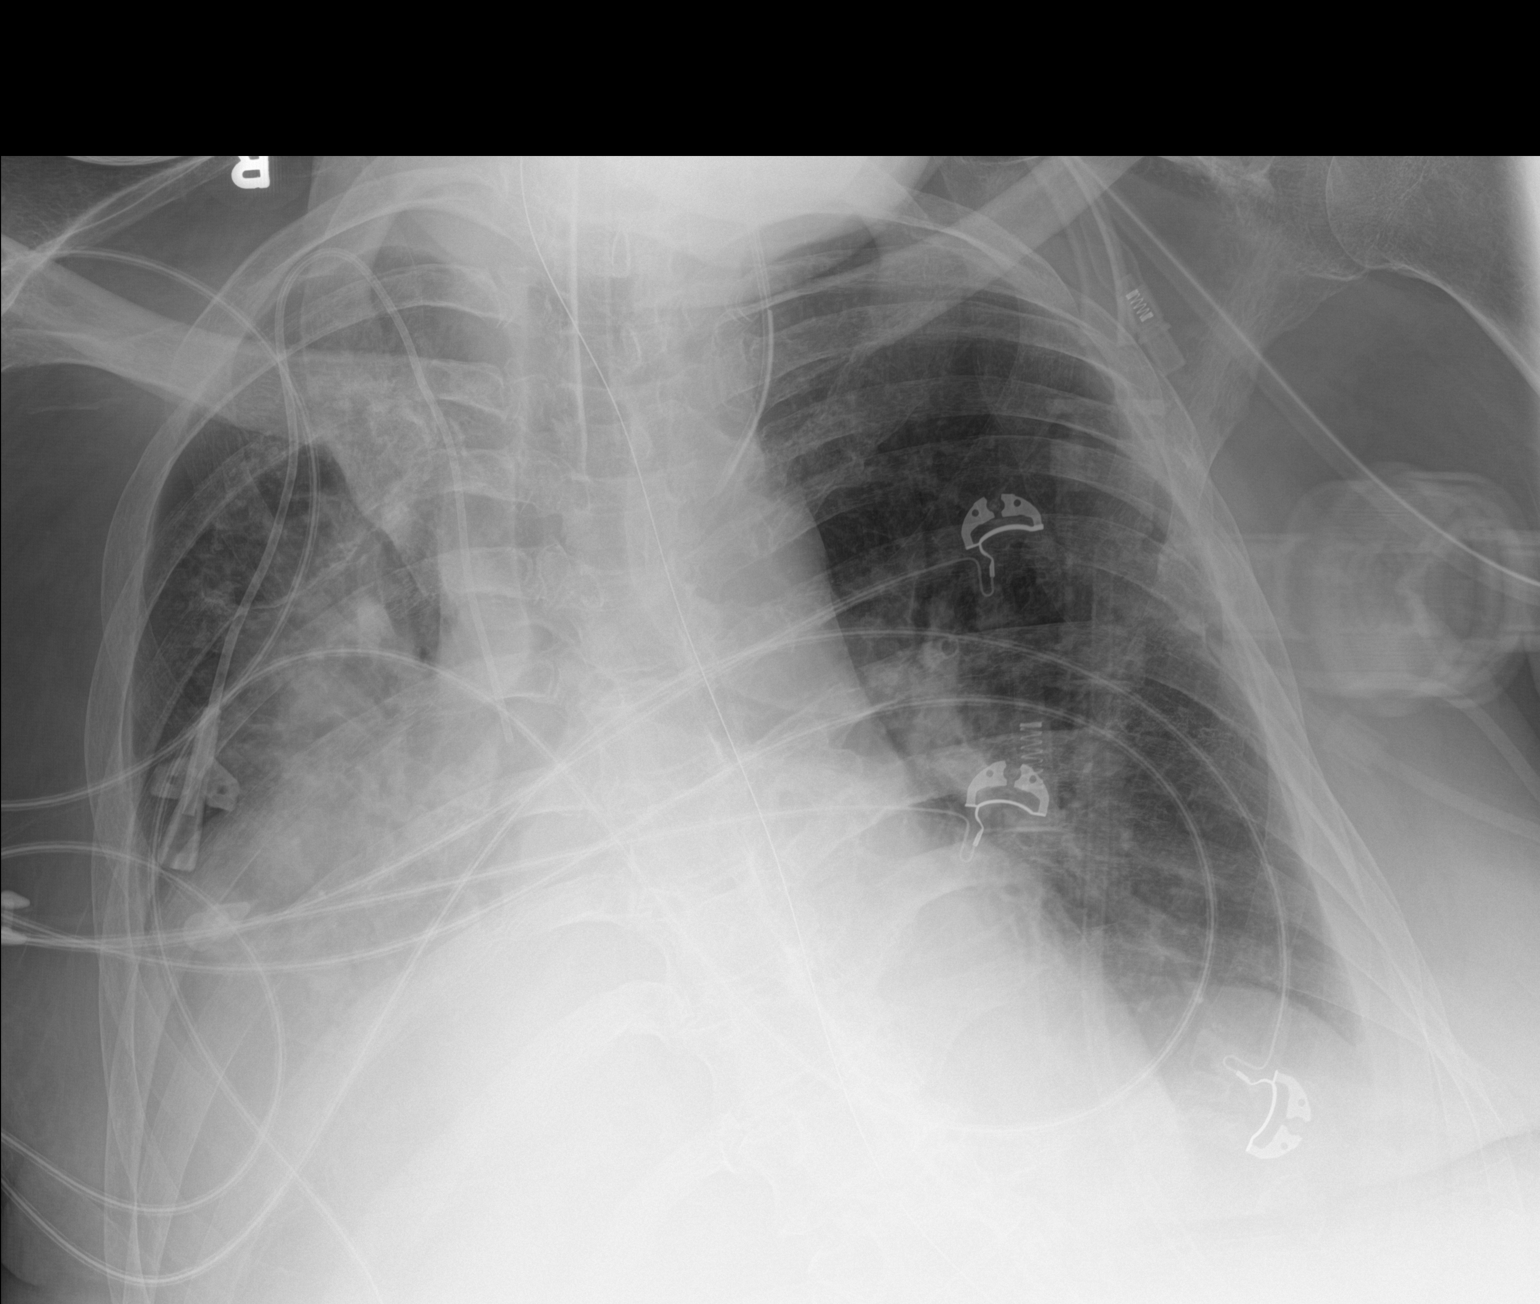

[1 of 1 positions shown; findings below may reference images not displayed]

FINDINGS: Endotracheal tube terminates in the mid trachea, 4 cm from the
carina. Transesophageal tube side port is at the GE junction and
should be advanced 3-5 cm for optimal function. Right IJ approach
central venous catheter tip terminates at the superior cavoatrial
junction. Persistent bilateral airspace opacities more focally
confluent in the right mid to lower lung with asymmetric elevation
of the right hemidiaphragm and bilateral pleural effusions.
Cardiomediastinal contours are stable. No pneumothorax. No acute
osseous or soft tissue abnormality. Degenerative changes are present
in the imaged spine and shoulders.
IMPRESSION: 1. Stable bilateral airspace opacities and pleural effusions.
2. Right IJ catheter tip terminates at the superior cavoatrial
junction.
3. Left IJ catheter sheath terminates at the level of the left
brachiocephalic vein.
4. Transesophageal tube side port at the GE junction and should be
advanced 3-5 cm for optimal function.
5. Endotracheal tube terminates appropriately within the mid
trachea.

## 2022-03-13 IMAGING — DX DG ABDOMEN 1V
1 series · 1 of 1 positions shown · non-contrast
Comparison: 07/21/2019, chest x-ray today

CLINICAL DATA: NG tube placement

EXAM:
ABDOMEN - 1 VIEW

[abdomen kub]
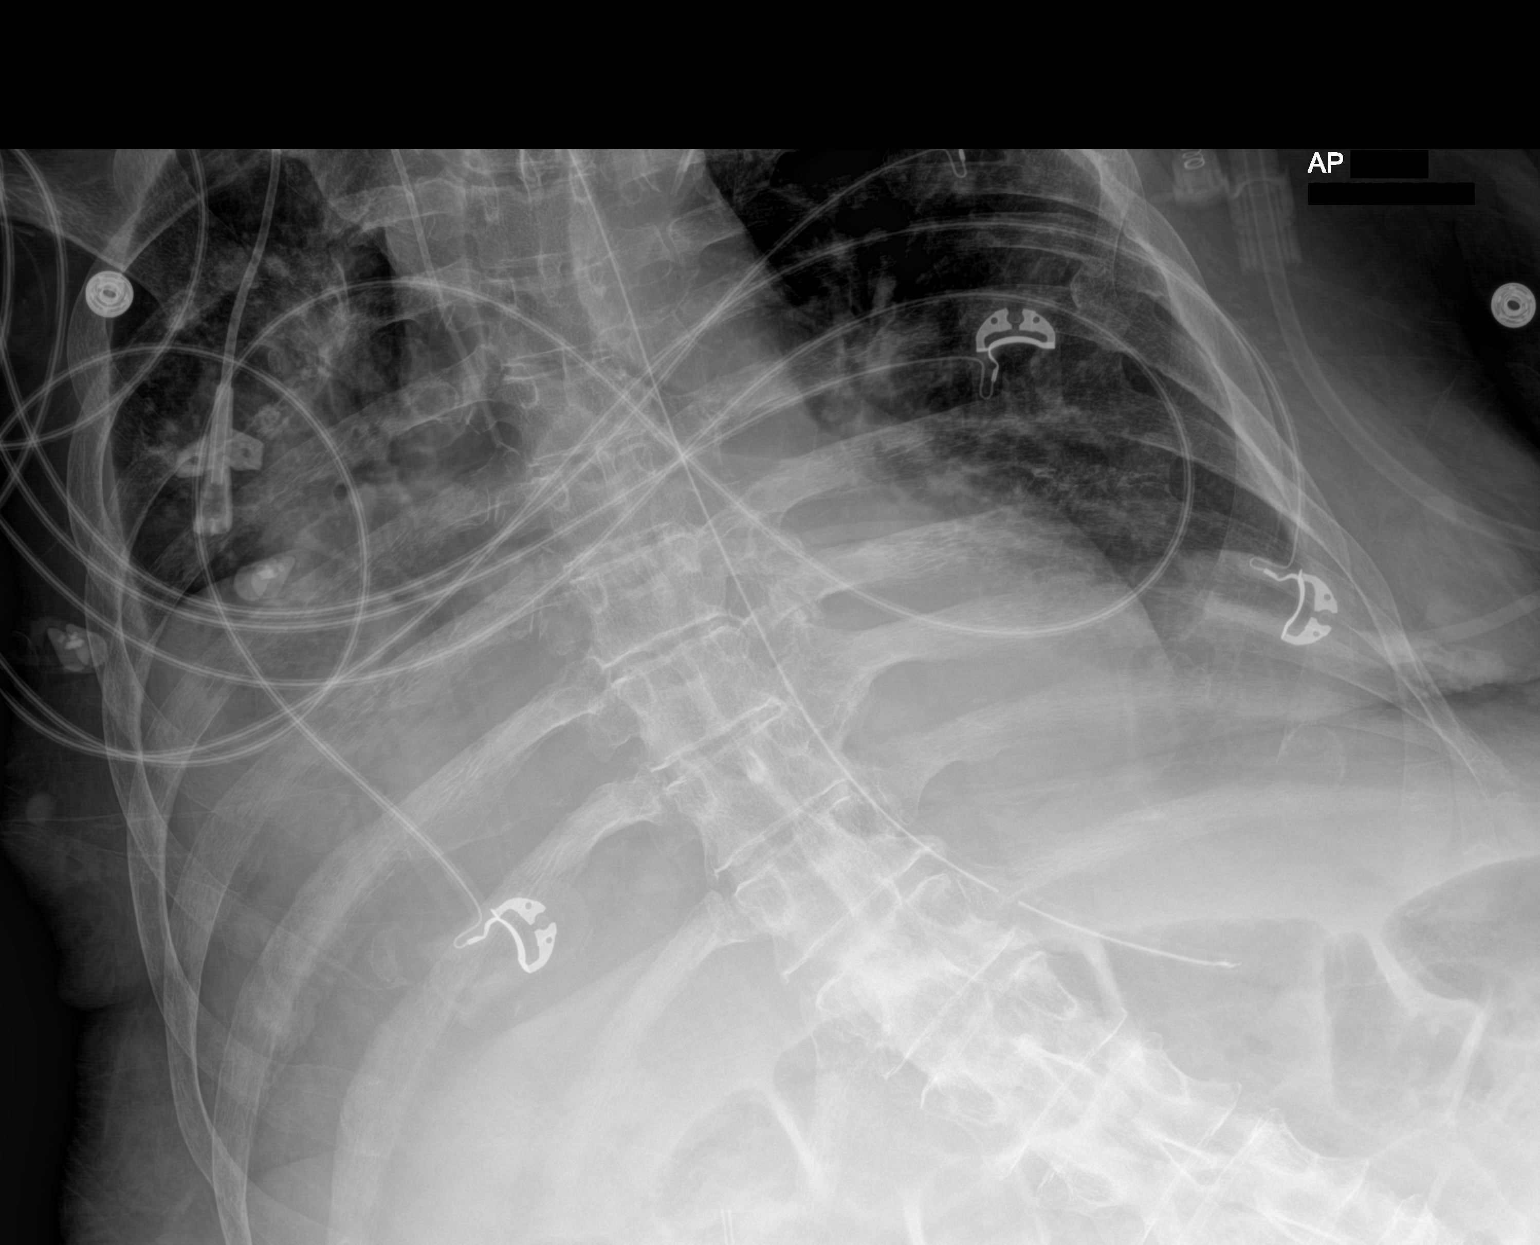

[1 of 1 positions shown; findings below may reference images not displayed]

FINDINGS: NG tube tip is within the stomach. Stable bilateral airspace
opacities, elevation of the right hemidiaphragm.
IMPRESSION: NG tube tip in the stomach.
